# Patient Record
Sex: Male | Born: 1940
Health system: Southern US, Community
[De-identification: ages and names within clinical notes are randomized; demographics above are authoritative.]

## PROBLEM LIST (undated history)

## (undated) DIAGNOSIS — K222 Esophageal obstruction: Secondary | ICD-10-CM

## (undated) DIAGNOSIS — D649 Anemia, unspecified: Secondary | ICD-10-CM

## (undated) DIAGNOSIS — G8929 Other chronic pain: Secondary | ICD-10-CM

## (undated) DIAGNOSIS — M4800 Spinal stenosis, site unspecified: Secondary | ICD-10-CM

## (undated) DIAGNOSIS — M545 Low back pain, unspecified: Secondary | ICD-10-CM

## (undated) DIAGNOSIS — E782 Mixed hyperlipidemia: Secondary | ICD-10-CM

## (undated) DIAGNOSIS — E119 Type 2 diabetes mellitus without complications: Secondary | ICD-10-CM

## (undated) DIAGNOSIS — Z9989 Dependence on other enabling machines and devices: Secondary | ICD-10-CM

## (undated) DIAGNOSIS — I10 Essential (primary) hypertension: Secondary | ICD-10-CM

## (undated) DIAGNOSIS — M1990 Unspecified osteoarthritis, unspecified site: Secondary | ICD-10-CM

## (undated) DIAGNOSIS — K219 Gastro-esophageal reflux disease without esophagitis: Secondary | ICD-10-CM

## (undated) DIAGNOSIS — R590 Localized enlarged lymph nodes: Secondary | ICD-10-CM

## (undated) DIAGNOSIS — D61818 Other pancytopenia: Secondary | ICD-10-CM

## (undated) DIAGNOSIS — Z950 Presence of cardiac pacemaker: Secondary | ICD-10-CM

## (undated) DIAGNOSIS — I350 Nonrheumatic aortic (valve) stenosis: Secondary | ICD-10-CM

## (undated) DIAGNOSIS — Z8739 Personal history of other diseases of the musculoskeletal system and connective tissue: Secondary | ICD-10-CM

## (undated) DIAGNOSIS — K5790 Diverticulosis of intestine, part unspecified, without perforation or abscess without bleeding: Secondary | ICD-10-CM

## (undated) DIAGNOSIS — G4733 Obstructive sleep apnea (adult) (pediatric): Secondary | ICD-10-CM

## (undated) DIAGNOSIS — C8330 Diffuse large B-cell lymphoma, unspecified site: Secondary | ICD-10-CM

## (undated) DIAGNOSIS — I2510 Atherosclerotic heart disease of native coronary artery without angina pectoris: Secondary | ICD-10-CM

## (undated) DIAGNOSIS — R001 Bradycardia, unspecified: Secondary | ICD-10-CM

## (undated) DIAGNOSIS — Z972 Presence of dental prosthetic device (complete) (partial): Secondary | ICD-10-CM

## (undated) HISTORY — DX: Diverticulosis of intestine, part unspecified, without perforation or abscess without bleeding: K57.90

## (undated) HISTORY — DX: Spinal stenosis, site unspecified: M48.00

## (undated) HISTORY — PX: OTHER SURGICAL HISTORY: SHX169

## (undated) HISTORY — DX: Unspecified osteoarthritis, unspecified site: M19.90

## (undated) HISTORY — DX: Esophageal obstruction: K22.2

## (undated) HISTORY — DX: Essential (primary) hypertension: I10

## (undated) HISTORY — PX: PANENDOSCOPY: SHX2159

## (undated) HISTORY — DX: Anemia, unspecified: D64.9

## (undated) HISTORY — PX: ESOPHAGEAL DILATION: SHX303

## (undated) HISTORY — PX: FLEXIBLE SIGMOIDOSCOPY: SHX1649

## (undated) HISTORY — DX: Atherosclerotic heart disease of native coronary artery without angina pectoris: I25.10

## (undated) HISTORY — DX: Mixed hyperlipidemia: E78.2

## (undated) HISTORY — DX: Gastro-esophageal reflux disease without esophagitis: K21.9

## (undated) HISTORY — PX: VASECTOMY: SHX75

## (undated) HISTORY — PX: ESOPHAGOGASTRODUODENOSCOPY: SHX1529

## (undated) HISTORY — PX: APPENDECTOMY: SHX54

## (undated) HISTORY — PX: TONSILLECTOMY: SUR1361

## (undated) HISTORY — PX: BACK SURGERY: SHX140

## (undated) HISTORY — PX: SKIN CANCER EXCISION: SHX779

---

## 1991-07-02 HISTORY — PX: CORONARY ANGIOPLASTY: SHX604

## 1997-05-31 HISTORY — PX: CORONARY ANGIOPLASTY WITH STENT PLACEMENT: SHX49

## 1998-08-25 ENCOUNTER — Ambulatory Visit (HOSPITAL_COMMUNITY): Admission: RE | Admit: 1998-08-25 | Discharge: 1998-08-25 | Payer: Self-pay | Admitting: Gastroenterology

## 1998-08-25 ENCOUNTER — Encounter: Payer: Self-pay | Admitting: Gastroenterology

## 2000-03-01 HISTORY — PX: CORONARY ARTERY BYPASS GRAFT: SHX141

## 2000-03-20 ENCOUNTER — Encounter: Payer: Self-pay | Admitting: Emergency Medicine

## 2000-03-20 ENCOUNTER — Inpatient Hospital Stay (HOSPITAL_COMMUNITY): Admission: EM | Admit: 2000-03-20 | Discharge: 2000-03-30 | Payer: Self-pay | Admitting: Emergency Medicine

## 2000-03-26 ENCOUNTER — Encounter: Payer: Self-pay | Admitting: Surgery

## 2000-03-27 ENCOUNTER — Encounter: Payer: Self-pay | Admitting: Surgery

## 2000-03-28 ENCOUNTER — Encounter: Payer: Self-pay | Admitting: Thoracic Surgery (Cardiothoracic Vascular Surgery)

## 2000-03-28 ENCOUNTER — Encounter: Payer: Self-pay | Admitting: Surgery

## 2001-01-22 ENCOUNTER — Other Ambulatory Visit: Admission: RE | Admit: 2001-01-22 | Discharge: 2001-01-22 | Payer: Self-pay | Admitting: Gastroenterology

## 2001-01-22 ENCOUNTER — Encounter (INDEPENDENT_AMBULATORY_CARE_PROVIDER_SITE_OTHER): Payer: Self-pay | Admitting: Specialist

## 2002-07-30 ENCOUNTER — Encounter: Payer: Self-pay | Admitting: Internal Medicine

## 2002-07-30 ENCOUNTER — Encounter: Admission: RE | Admit: 2002-07-30 | Discharge: 2002-07-30 | Payer: Self-pay | Admitting: Internal Medicine

## 2003-10-31 ENCOUNTER — Ambulatory Visit (HOSPITAL_COMMUNITY): Admission: RE | Admit: 2003-10-31 | Discharge: 2003-10-31 | Payer: Self-pay | Admitting: Gastroenterology

## 2004-05-04 ENCOUNTER — Ambulatory Visit: Payer: Self-pay | Admitting: Internal Medicine

## 2004-08-01 ENCOUNTER — Ambulatory Visit: Payer: Self-pay | Admitting: Internal Medicine

## 2004-08-21 ENCOUNTER — Ambulatory Visit: Payer: Self-pay | Admitting: Family Medicine

## 2004-08-27 ENCOUNTER — Ambulatory Visit: Payer: Self-pay | Admitting: Family Medicine

## 2004-08-30 ENCOUNTER — Ambulatory Visit: Payer: Self-pay | Admitting: Cardiology

## 2004-09-17 ENCOUNTER — Ambulatory Visit: Payer: Self-pay | Admitting: Cardiology

## 2004-09-20 ENCOUNTER — Ambulatory Visit: Payer: Self-pay | Admitting: Cardiology

## 2004-11-19 ENCOUNTER — Ambulatory Visit: Payer: Self-pay | Admitting: Internal Medicine

## 2004-11-20 ENCOUNTER — Ambulatory Visit: Payer: Self-pay | Admitting: Internal Medicine

## 2004-12-03 ENCOUNTER — Ambulatory Visit: Payer: Self-pay | Admitting: Internal Medicine

## 2005-01-07 ENCOUNTER — Ambulatory Visit: Payer: Self-pay | Admitting: Internal Medicine

## 2005-01-08 ENCOUNTER — Ambulatory Visit: Payer: Self-pay | Admitting: Internal Medicine

## 2005-01-28 ENCOUNTER — Encounter: Admission: RE | Admit: 2005-01-28 | Discharge: 2005-04-28 | Payer: Self-pay | Admitting: Internal Medicine

## 2005-02-13 ENCOUNTER — Ambulatory Visit: Payer: Self-pay | Admitting: Cardiology

## 2005-02-27 ENCOUNTER — Ambulatory Visit: Payer: Self-pay | Admitting: Gastroenterology

## 2005-02-28 ENCOUNTER — Ambulatory Visit: Payer: Self-pay | Admitting: Cardiology

## 2005-03-12 ENCOUNTER — Ambulatory Visit: Payer: Self-pay | Admitting: Internal Medicine

## 2005-04-15 ENCOUNTER — Ambulatory Visit: Payer: Self-pay | Admitting: Internal Medicine

## 2005-06-21 ENCOUNTER — Ambulatory Visit: Payer: Self-pay | Admitting: Internal Medicine

## 2005-08-12 ENCOUNTER — Ambulatory Visit: Payer: Self-pay | Admitting: Internal Medicine

## 2005-08-22 ENCOUNTER — Ambulatory Visit: Payer: Self-pay | Admitting: Internal Medicine

## 2005-09-06 ENCOUNTER — Ambulatory Visit: Payer: Self-pay | Admitting: Internal Medicine

## 2005-10-07 ENCOUNTER — Ambulatory Visit: Payer: Self-pay | Admitting: Internal Medicine

## 2006-01-20 ENCOUNTER — Ambulatory Visit: Payer: Self-pay | Admitting: Internal Medicine

## 2006-01-21 ENCOUNTER — Ambulatory Visit: Payer: Self-pay | Admitting: Internal Medicine

## 2006-01-29 ENCOUNTER — Ambulatory Visit: Payer: Self-pay | Admitting: Cardiology

## 2006-02-05 ENCOUNTER — Ambulatory Visit: Payer: Self-pay

## 2006-05-26 ENCOUNTER — Ambulatory Visit: Payer: Self-pay | Admitting: Internal Medicine

## 2006-05-26 LAB — CONVERTED CEMR LAB: Creatinine,U: 113.5 mg/dL

## 2006-07-14 ENCOUNTER — Ambulatory Visit: Payer: Self-pay | Admitting: Internal Medicine

## 2006-07-31 ENCOUNTER — Ambulatory Visit: Payer: Self-pay | Admitting: Internal Medicine

## 2006-08-17 DIAGNOSIS — K229 Disease of esophagus, unspecified: Secondary | ICD-10-CM | POA: Insufficient documentation

## 2006-08-17 DIAGNOSIS — M109 Gout, unspecified: Secondary | ICD-10-CM | POA: Insufficient documentation

## 2006-08-26 ENCOUNTER — Ambulatory Visit: Payer: Self-pay | Admitting: Internal Medicine

## 2006-11-07 ENCOUNTER — Encounter: Payer: Self-pay | Admitting: Internal Medicine

## 2006-11-07 ENCOUNTER — Ambulatory Visit: Payer: Self-pay | Admitting: Internal Medicine

## 2006-11-07 LAB — CONVERTED CEMR LAB
ALT: 33 units/L (ref 0–40)
AST: 28 units/L (ref 0–37)
BUN: 12 mg/dL (ref 6–23)
Cholesterol: 145 mg/dL (ref 0–200)
Creatinine, Ser: 1 mg/dL (ref 0.4–1.5)
Direct LDL: 65.6 mg/dL
HDL: 27.1 mg/dL — ABNORMAL LOW (ref >39.0)
Potassium: 4.4 meq/L (ref 3.5–5.1)
Total CHOL/HDL Ratio: 5.4
Triglycerides: 463 mg/dL (ref 0–149)
Uric Acid, Serum: 5.9 mg/dL (ref 2.4–7.0)
VLDL: 93 mg/dL — ABNORMAL HIGH (ref 0–40)

## 2006-11-19 ENCOUNTER — Encounter: Payer: Self-pay | Admitting: Internal Medicine

## 2006-12-01 ENCOUNTER — Ambulatory Visit: Payer: Self-pay | Admitting: Gastroenterology

## 2006-12-16 ENCOUNTER — Ambulatory Visit: Payer: Self-pay | Admitting: Otolaryngology

## 2007-01-04 ENCOUNTER — Ambulatory Visit: Payer: Self-pay | Admitting: Otolaryngology

## 2007-01-16 ENCOUNTER — Ambulatory Visit: Payer: Self-pay | Admitting: Otolaryngology

## 2007-01-30 ENCOUNTER — Ambulatory Visit: Payer: Self-pay | Admitting: Cardiology

## 2007-02-02 ENCOUNTER — Ambulatory Visit: Payer: Self-pay | Admitting: Cardiology

## 2007-02-02 LAB — CONVERTED CEMR LAB
ALT: 29 units/L (ref 0–53)
AST: 24 units/L (ref 0–37)
Alkaline Phosphatase: 113 units/L (ref 39–117)
Bilirubin, Direct: 0.1 mg/dL (ref 0.0–0.3)
Cholesterol: 157 mg/dL (ref 0–200)
HDL: 33.5 mg/dL — ABNORMAL LOW (ref >39.0)
Total Bilirubin: 0.7 mg/dL (ref 0.3–1.2)
Total CHOL/HDL Ratio: 4.7
Total Protein: 7 g/dL (ref 6.0–8.3)
VLDL: 40 mg/dL (ref 0–40)

## 2007-03-11 ENCOUNTER — Ambulatory Visit: Payer: Self-pay | Admitting: Cardiology

## 2007-03-27 ENCOUNTER — Ambulatory Visit: Payer: Self-pay | Admitting: Cardiology

## 2007-05-27 ENCOUNTER — Ambulatory Visit: Payer: Self-pay | Admitting: Internal Medicine

## 2007-06-23 ENCOUNTER — Ambulatory Visit: Payer: Self-pay | Admitting: Cardiology

## 2007-06-23 LAB — CONVERTED CEMR LAB
BUN: 17 mg/dL (ref 6–23)
CO2: 28 meq/L (ref 19–32)
Creatinine, Ser: 0.9 mg/dL (ref 0.4–1.5)
Sodium: 140 meq/L (ref 135–145)

## 2007-07-24 ENCOUNTER — Ambulatory Visit: Payer: Self-pay | Admitting: Cardiology

## 2007-08-05 ENCOUNTER — Ambulatory Visit: Payer: Self-pay | Admitting: Cardiology

## 2007-08-05 LAB — CONVERTED CEMR LAB
GFR calc Af Amer: 95 mL/min
GFR calc non Af Amer: 79 mL/min

## 2007-09-11 ENCOUNTER — Telehealth (INDEPENDENT_AMBULATORY_CARE_PROVIDER_SITE_OTHER): Payer: Self-pay | Admitting: *Deleted

## 2007-09-11 ENCOUNTER — Emergency Department (HOSPITAL_COMMUNITY): Admission: EM | Admit: 2007-09-11 | Discharge: 2007-09-11 | Payer: Self-pay | Admitting: Emergency Medicine

## 2007-10-01 ENCOUNTER — Ambulatory Visit: Payer: Self-pay | Admitting: Internal Medicine

## 2007-10-20 ENCOUNTER — Emergency Department (HOSPITAL_COMMUNITY): Admission: EM | Admit: 2007-10-20 | Discharge: 2007-10-20 | Payer: Self-pay | Admitting: Emergency Medicine

## 2007-10-26 ENCOUNTER — Ambulatory Visit: Payer: Self-pay | Admitting: Cardiology

## 2007-12-29 ENCOUNTER — Ambulatory Visit: Payer: Self-pay | Admitting: Internal Medicine

## 2007-12-29 ENCOUNTER — Encounter (INDEPENDENT_AMBULATORY_CARE_PROVIDER_SITE_OTHER): Payer: Self-pay | Admitting: *Deleted

## 2007-12-29 LAB — CONVERTED CEMR LAB
OCCULT 1: NEGATIVE
OCCULT 2: NEGATIVE

## 2008-02-25 ENCOUNTER — Ambulatory Visit: Payer: Self-pay | Admitting: Cardiology

## 2008-04-11 ENCOUNTER — Ambulatory Visit: Payer: Self-pay

## 2008-04-11 ENCOUNTER — Encounter: Payer: Self-pay | Admitting: Internal Medicine

## 2008-05-12 ENCOUNTER — Telehealth (INDEPENDENT_AMBULATORY_CARE_PROVIDER_SITE_OTHER): Payer: Self-pay | Admitting: *Deleted

## 2008-05-23 ENCOUNTER — Ambulatory Visit: Payer: Self-pay | Admitting: Internal Medicine

## 2008-05-23 LAB — CONVERTED CEMR LAB
Alkaline Phosphatase: 120 units/L — ABNORMAL HIGH (ref 39–117)
Bilirubin, Direct: 0.1 mg/dL (ref 0.0–0.3)
CO2: 29 meq/L (ref 19–32)
GFR calc Af Amer: 95 mL/min
Glucose, Bld: 113 mg/dL — ABNORMAL HIGH (ref 70–99)
HDL: 39.3 mg/dL (ref >39.0)
Potassium: 4.2 meq/L (ref 3.5–5.1)
Sodium: 141 meq/L (ref 135–145)
Total Bilirubin: 0.8 mg/dL (ref 0.3–1.2)
Total Protein: 7 g/dL (ref 6.0–8.3)

## 2008-05-27 ENCOUNTER — Ambulatory Visit: Payer: Self-pay | Admitting: Internal Medicine

## 2008-06-08 ENCOUNTER — Telehealth (INDEPENDENT_AMBULATORY_CARE_PROVIDER_SITE_OTHER): Payer: Self-pay | Admitting: *Deleted

## 2008-06-09 ENCOUNTER — Telehealth (INDEPENDENT_AMBULATORY_CARE_PROVIDER_SITE_OTHER): Payer: Self-pay | Admitting: *Deleted

## 2008-06-20 ENCOUNTER — Ambulatory Visit: Payer: Self-pay | Admitting: Internal Medicine

## 2008-06-22 ENCOUNTER — Ambulatory Visit: Payer: Self-pay | Admitting: Internal Medicine

## 2008-07-08 ENCOUNTER — Ambulatory Visit: Payer: Self-pay | Admitting: Internal Medicine

## 2008-07-18 ENCOUNTER — Encounter (INDEPENDENT_AMBULATORY_CARE_PROVIDER_SITE_OTHER): Payer: Self-pay | Admitting: *Deleted

## 2008-07-18 ENCOUNTER — Ambulatory Visit: Payer: Self-pay | Admitting: Internal Medicine

## 2008-07-20 ENCOUNTER — Encounter (INDEPENDENT_AMBULATORY_CARE_PROVIDER_SITE_OTHER): Payer: Self-pay | Admitting: *Deleted

## 2008-08-15 ENCOUNTER — Ambulatory Visit: Payer: Self-pay | Admitting: Internal Medicine

## 2008-08-15 DIAGNOSIS — K219 Gastro-esophageal reflux disease without esophagitis: Secondary | ICD-10-CM | POA: Insufficient documentation

## 2008-08-17 ENCOUNTER — Ambulatory Visit: Payer: Self-pay | Admitting: Cardiology

## 2008-08-17 ENCOUNTER — Telehealth (INDEPENDENT_AMBULATORY_CARE_PROVIDER_SITE_OTHER): Payer: Self-pay | Admitting: *Deleted

## 2008-08-17 ENCOUNTER — Encounter (INDEPENDENT_AMBULATORY_CARE_PROVIDER_SITE_OTHER): Payer: Self-pay | Admitting: *Deleted

## 2008-08-17 ENCOUNTER — Encounter: Payer: Self-pay | Admitting: Internal Medicine

## 2008-08-17 LAB — CONVERTED CEMR LAB
Basophils Relative: 0.9 % (ref 0.0–3.0)
Eosinophils Absolute: 0.3 10*3/uL (ref 0.0–0.7)
Eosinophils Relative: 4.7 % (ref 0.0–5.0)
HCT: 45.4 % (ref 39.0–52.0)
Hemoglobin: 15.4 g/dL (ref 13.0–17.0)
MCV: 86.2 fL (ref 78.0–100.0)
Monocytes Absolute: 0.5 10*3/uL (ref 0.1–1.0)
Neutro Abs: 3.2 10*3/uL (ref 1.4–7.7)
Platelets: 255 10*3/uL (ref 150–400)
WBC: 6.1 10*3/uL (ref 4.5–10.5)

## 2008-08-19 ENCOUNTER — Encounter (INDEPENDENT_AMBULATORY_CARE_PROVIDER_SITE_OTHER): Payer: Self-pay | Admitting: *Deleted

## 2008-09-05 ENCOUNTER — Encounter: Payer: Self-pay | Admitting: Internal Medicine

## 2008-09-19 ENCOUNTER — Ambulatory Visit (HOSPITAL_COMMUNITY): Admission: RE | Admit: 2008-09-19 | Discharge: 2008-09-20 | Payer: Self-pay | Admitting: Otolaryngology

## 2008-10-04 ENCOUNTER — Encounter: Payer: Self-pay | Admitting: Internal Medicine

## 2008-10-10 ENCOUNTER — Ambulatory Visit: Payer: Self-pay | Admitting: Family Medicine

## 2008-10-10 DIAGNOSIS — J309 Allergic rhinitis, unspecified: Secondary | ICD-10-CM | POA: Insufficient documentation

## 2008-11-26 DIAGNOSIS — E785 Hyperlipidemia, unspecified: Secondary | ICD-10-CM | POA: Insufficient documentation

## 2008-12-12 ENCOUNTER — Ambulatory Visit: Payer: Self-pay | Admitting: Internal Medicine

## 2008-12-12 DIAGNOSIS — R351 Nocturia: Secondary | ICD-10-CM | POA: Insufficient documentation

## 2008-12-12 DIAGNOSIS — N401 Enlarged prostate with lower urinary tract symptoms: Secondary | ICD-10-CM

## 2008-12-12 DIAGNOSIS — N138 Other obstructive and reflux uropathy: Secondary | ICD-10-CM

## 2008-12-13 ENCOUNTER — Telehealth (INDEPENDENT_AMBULATORY_CARE_PROVIDER_SITE_OTHER): Payer: Self-pay | Admitting: *Deleted

## 2008-12-15 ENCOUNTER — Ambulatory Visit: Payer: Self-pay | Admitting: Internal Medicine

## 2008-12-18 LAB — CONVERTED CEMR LAB
Alkaline Phosphatase: 119 units/L — ABNORMAL HIGH (ref 39–117)
BUN: 22 mg/dL (ref 6–23)
Bilirubin, Direct: 0.1 mg/dL (ref 0.0–0.3)
Creatinine, Ser: 1 mg/dL (ref 0.4–1.5)
HDL: 31.1 mg/dL — ABNORMAL LOW (ref >39.00)
PSA: 0.57 ng/mL (ref 0.10–4.00)
Potassium: 4.5 meq/L (ref 3.5–5.1)
TSH: 2.9 microintl units/mL (ref 0.35–5.50)
Total Bilirubin: 0.7 mg/dL (ref 0.3–1.2)
Triglycerides: 449 mg/dL — ABNORMAL HIGH (ref 0.0–149.0)
VLDL: 89.8 mg/dL — ABNORMAL HIGH (ref 0.0–40.0)

## 2008-12-19 ENCOUNTER — Encounter (INDEPENDENT_AMBULATORY_CARE_PROVIDER_SITE_OTHER): Payer: Self-pay | Admitting: *Deleted

## 2009-01-04 ENCOUNTER — Encounter: Payer: Self-pay | Admitting: Internal Medicine

## 2009-01-18 ENCOUNTER — Telehealth (INDEPENDENT_AMBULATORY_CARE_PROVIDER_SITE_OTHER): Payer: Self-pay | Admitting: *Deleted

## 2009-01-19 ENCOUNTER — Encounter (INDEPENDENT_AMBULATORY_CARE_PROVIDER_SITE_OTHER): Payer: Self-pay | Admitting: *Deleted

## 2009-02-13 ENCOUNTER — Encounter: Payer: Self-pay | Admitting: Internal Medicine

## 2009-02-21 ENCOUNTER — Ambulatory Visit: Payer: Self-pay | Admitting: Cardiology

## 2009-02-21 DIAGNOSIS — R600 Localized edema: Secondary | ICD-10-CM

## 2009-03-02 ENCOUNTER — Ambulatory Visit: Payer: Self-pay | Admitting: Cardiology

## 2009-03-03 LAB — CONVERTED CEMR LAB
BUN: 13 mg/dL (ref 6–23)
Chloride: 106 meq/L (ref 96–112)
GFR calc non Af Amer: 88.51 mL/min (ref >60)
Glucose, Bld: 123 mg/dL — ABNORMAL HIGH (ref 70–99)
Potassium: 4.4 meq/L (ref 3.5–5.1)
Sodium: 141 meq/L (ref 135–145)

## 2009-03-09 ENCOUNTER — Ambulatory Visit: Payer: Self-pay | Admitting: Internal Medicine

## 2009-03-16 ENCOUNTER — Telehealth (INDEPENDENT_AMBULATORY_CARE_PROVIDER_SITE_OTHER): Payer: Self-pay | Admitting: *Deleted

## 2009-03-22 DIAGNOSIS — C4492 Squamous cell carcinoma of skin, unspecified: Secondary | ICD-10-CM | POA: Insufficient documentation

## 2009-03-22 HISTORY — DX: Squamous cell carcinoma of skin, unspecified: C44.92

## 2009-03-24 ENCOUNTER — Telehealth: Payer: Self-pay | Admitting: Cardiology

## 2009-03-29 ENCOUNTER — Telehealth: Payer: Self-pay | Admitting: Cardiology

## 2009-03-29 ENCOUNTER — Ambulatory Visit: Payer: Self-pay | Admitting: Cardiology

## 2009-03-29 DIAGNOSIS — I10 Essential (primary) hypertension: Secondary | ICD-10-CM

## 2009-03-30 LAB — CONVERTED CEMR LAB
CO2: 29 meq/L (ref 19–32)
Calcium: 9.8 mg/dL (ref 8.4–10.5)
Chloride: 100 meq/L (ref 96–112)
Creatinine, Ser: 1 mg/dL (ref 0.4–1.5)
Glucose, Bld: 82 mg/dL (ref 70–99)
Sodium: 139 meq/L (ref 135–145)

## 2009-04-11 ENCOUNTER — Ambulatory Visit: Payer: Self-pay | Admitting: Family Medicine

## 2009-05-03 ENCOUNTER — Telehealth (INDEPENDENT_AMBULATORY_CARE_PROVIDER_SITE_OTHER): Payer: Self-pay | Admitting: *Deleted

## 2009-05-04 ENCOUNTER — Ambulatory Visit: Payer: Self-pay | Admitting: Internal Medicine

## 2009-05-05 ENCOUNTER — Telehealth (INDEPENDENT_AMBULATORY_CARE_PROVIDER_SITE_OTHER): Payer: Self-pay | Admitting: *Deleted

## 2009-05-08 ENCOUNTER — Encounter (INDEPENDENT_AMBULATORY_CARE_PROVIDER_SITE_OTHER): Payer: Self-pay | Admitting: *Deleted

## 2009-05-08 LAB — CONVERTED CEMR LAB
Direct LDL: 48.7 mg/dL
HDL: 33.1 mg/dL — ABNORMAL LOW (ref >39.00)
Hgb A1c MFr Bld: 6.7 % — ABNORMAL HIGH (ref 4.6–6.5)
Total CHOL/HDL Ratio: 5
Triglycerides: 611 mg/dL — ABNORMAL HIGH (ref 0.0–149.0)

## 2009-05-24 ENCOUNTER — Encounter (INDEPENDENT_AMBULATORY_CARE_PROVIDER_SITE_OTHER): Payer: Self-pay | Admitting: *Deleted

## 2009-05-24 ENCOUNTER — Ambulatory Visit: Payer: Self-pay | Admitting: Cardiology

## 2009-05-31 ENCOUNTER — Telehealth (INDEPENDENT_AMBULATORY_CARE_PROVIDER_SITE_OTHER): Payer: Self-pay | Admitting: *Deleted

## 2009-06-05 ENCOUNTER — Telehealth (INDEPENDENT_AMBULATORY_CARE_PROVIDER_SITE_OTHER): Payer: Self-pay | Admitting: *Deleted

## 2009-06-06 ENCOUNTER — Observation Stay (HOSPITAL_COMMUNITY): Admission: RE | Admit: 2009-06-06 | Discharge: 2009-06-07 | Payer: Self-pay | Admitting: Orthopedic Surgery

## 2009-06-21 ENCOUNTER — Ambulatory Visit: Payer: Self-pay | Admitting: Internal Medicine

## 2009-06-29 ENCOUNTER — Telehealth: Payer: Self-pay | Admitting: Cardiology

## 2009-07-01 HISTORY — PX: KNEE ARTHROSCOPY: SHX127

## 2009-07-04 ENCOUNTER — Ambulatory Visit: Payer: Self-pay | Admitting: Internal Medicine

## 2009-07-06 ENCOUNTER — Telehealth: Payer: Self-pay | Admitting: Cardiology

## 2009-07-25 ENCOUNTER — Ambulatory Visit: Payer: Self-pay | Admitting: Internal Medicine

## 2009-07-26 ENCOUNTER — Encounter (INDEPENDENT_AMBULATORY_CARE_PROVIDER_SITE_OTHER): Payer: Self-pay | Admitting: *Deleted

## 2009-08-15 ENCOUNTER — Ambulatory Visit: Payer: Self-pay | Admitting: Internal Medicine

## 2009-08-15 DIAGNOSIS — E119 Type 2 diabetes mellitus without complications: Secondary | ICD-10-CM

## 2009-08-15 DIAGNOSIS — J449 Chronic obstructive pulmonary disease, unspecified: Secondary | ICD-10-CM | POA: Insufficient documentation

## 2009-08-16 ENCOUNTER — Ambulatory Visit: Payer: Self-pay | Admitting: Internal Medicine

## 2009-08-18 LAB — CONVERTED CEMR LAB
BUN: 14 mg/dL (ref 6–23)
Basophils Absolute: 0.1 10*3/uL (ref 0.0–0.1)
Basophils Relative: 1.2 % (ref 0.0–3.0)
Creatinine, Ser: 1 mg/dL (ref 0.4–1.5)
Creatinine,U: 128.1 mg/dL
Eosinophils Absolute: 0.2 10*3/uL (ref 0.0–0.7)
Eosinophils Relative: 4.1 % (ref 0.0–5.0)
HCT: 40.6 % (ref 39.0–52.0)
Hemoglobin: 13.4 g/dL (ref 13.0–17.0)
Hgb A1c MFr Bld: 6.9 % — ABNORMAL HIGH (ref 4.6–6.5)
Lymphocytes Relative: 38.4 % (ref 12.0–46.0)
Lymphs Abs: 2 10*3/uL (ref 0.7–4.0)
MCHC: 32.9 g/dL (ref 30.0–36.0)
MCV: 78.9 fL (ref 78.0–100.0)
Microalb Creat Ratio: 3.1 mg/g (ref 0.0–30.0)
Microalb, Ur: 0.4 mg/dL (ref 0.0–1.9)
Monocytes Absolute: 1.2 10*3/uL — ABNORMAL HIGH (ref 0.1–1.0)
Monocytes Relative: 24 % — ABNORMAL HIGH (ref 3.0–12.0)
Neutro Abs: 1.7 10*3/uL (ref 1.4–7.7)
Neutrophils Relative %: 32.3 % — ABNORMAL LOW (ref 43.0–77.0)
Platelets: 303 10*3/uL (ref 150.0–400.0)
Potassium: 4.2 meq/L (ref 3.5–5.1)
RBC: 5.15 M/uL (ref 4.22–5.81)
RDW: 16.6 % — ABNORMAL HIGH (ref 11.5–14.6)
WBC: 5.2 10*3/uL (ref 4.5–10.5)

## 2009-08-22 ENCOUNTER — Encounter: Payer: Self-pay | Admitting: Internal Medicine

## 2009-08-22 ENCOUNTER — Ambulatory Visit: Payer: Self-pay | Admitting: Cardiology

## 2009-08-22 DIAGNOSIS — I25810 Atherosclerosis of coronary artery bypass graft(s) without angina pectoris: Secondary | ICD-10-CM

## 2009-08-25 ENCOUNTER — Encounter: Payer: Self-pay | Admitting: Internal Medicine

## 2009-08-25 ENCOUNTER — Telehealth: Payer: Self-pay | Admitting: Internal Medicine

## 2009-08-31 ENCOUNTER — Encounter: Payer: Self-pay | Admitting: Internal Medicine

## 2009-09-14 ENCOUNTER — Telehealth (INDEPENDENT_AMBULATORY_CARE_PROVIDER_SITE_OTHER): Payer: Self-pay | Admitting: *Deleted

## 2009-09-27 ENCOUNTER — Encounter: Payer: Self-pay | Admitting: Internal Medicine

## 2009-10-19 ENCOUNTER — Encounter: Payer: Self-pay | Admitting: Internal Medicine

## 2009-11-02 ENCOUNTER — Ambulatory Visit: Payer: Self-pay | Admitting: Internal Medicine

## 2009-11-02 DIAGNOSIS — R209 Unspecified disturbances of skin sensation: Secondary | ICD-10-CM | POA: Insufficient documentation

## 2010-01-30 ENCOUNTER — Ambulatory Visit: Payer: Self-pay | Admitting: Cardiology

## 2010-03-20 ENCOUNTER — Ambulatory Visit: Payer: Self-pay | Admitting: Internal Medicine

## 2010-03-20 DIAGNOSIS — M1710 Unilateral primary osteoarthritis, unspecified knee: Secondary | ICD-10-CM

## 2010-03-20 DIAGNOSIS — IMO0002 Reserved for concepts with insufficient information to code with codable children: Secondary | ICD-10-CM | POA: Insufficient documentation

## 2010-03-20 DIAGNOSIS — M1990 Unspecified osteoarthritis, unspecified site: Secondary | ICD-10-CM | POA: Insufficient documentation

## 2010-03-21 ENCOUNTER — Ambulatory Visit: Payer: Self-pay | Admitting: Internal Medicine

## 2010-03-23 LAB — CONVERTED CEMR LAB
ALT: 24 units/L (ref 0–53)
Albumin: 4.1 g/dL (ref 3.5–5.2)
BUN: 18 mg/dL (ref 6–23)
Basophils Absolute: 0 10*3/uL (ref 0.0–0.1)
Bilirubin, Direct: 0.1 mg/dL (ref 0.0–0.3)
CO2: 27 meq/L (ref 19–32)
Calcium: 9.3 mg/dL (ref 8.4–10.5)
Chloride: 104 meq/L (ref 96–112)
Creatinine, Ser: 1 mg/dL (ref 0.4–1.5)
Eosinophils Relative: 3 % (ref 0.0–5.0)
Glucose, Bld: 86 mg/dL (ref 70–99)
HCT: 39.3 % (ref 39.0–52.0)
HDL: 30.8 mg/dL — ABNORMAL LOW (ref >39.00)
Hemoglobin: 13.1 g/dL (ref 13.0–17.0)
Lymphs Abs: 1.5 10*3/uL (ref 0.7–4.0)
MCV: 81.7 fL (ref 78.0–100.0)
Monocytes Absolute: 0.6 10*3/uL (ref 0.1–1.0)
Monocytes Relative: 8.8 % (ref 3.0–12.0)
Neutro Abs: 4.5 10*3/uL (ref 1.4–7.7)
RDW: 16.6 % — ABNORMAL HIGH (ref 11.5–14.6)
TSH: 1.76 microintl units/mL (ref 0.35–5.50)
Total Protein: 6.8 g/dL (ref 6.0–8.3)
Triglycerides: 329 mg/dL — ABNORMAL HIGH (ref 0.0–149.0)

## 2010-04-09 ENCOUNTER — Telehealth: Payer: Self-pay | Admitting: Internal Medicine

## 2010-05-01 ENCOUNTER — Ambulatory Visit: Payer: Self-pay | Admitting: Internal Medicine

## 2010-05-04 ENCOUNTER — Ambulatory Visit: Payer: Self-pay | Admitting: Cardiology

## 2010-05-07 ENCOUNTER — Ambulatory Visit: Payer: Self-pay | Admitting: Internal Medicine

## 2010-05-08 LAB — CONVERTED CEMR LAB: Uric Acid, Serum: 10.2 mg/dL — ABNORMAL HIGH (ref 4.0–7.8)

## 2010-05-16 ENCOUNTER — Encounter: Payer: Self-pay | Admitting: Family Medicine

## 2010-05-17 ENCOUNTER — Ambulatory Visit: Payer: Self-pay | Admitting: Internal Medicine

## 2010-05-21 ENCOUNTER — Telehealth: Payer: Self-pay | Admitting: Internal Medicine

## 2010-05-28 ENCOUNTER — Telehealth: Payer: Self-pay | Admitting: Internal Medicine

## 2010-05-29 ENCOUNTER — Ambulatory Visit: Payer: Self-pay | Admitting: Family Medicine

## 2010-05-29 DIAGNOSIS — A088 Other specified intestinal infections: Secondary | ICD-10-CM

## 2010-05-29 LAB — CONVERTED CEMR LAB
Bilirubin Urine: NEGATIVE
Ketones, urine, test strip: NEGATIVE
Nitrite: NEGATIVE
Protein, U semiquant: NEGATIVE
Urobilinogen, UA: NEGATIVE

## 2010-05-30 LAB — CONVERTED CEMR LAB
AST: 22 units/L (ref 0–37)
Albumin: 3.9 g/dL (ref 3.5–5.2)
BUN: 23 mg/dL (ref 6–23)
Basophils Absolute: 0 10*3/uL (ref 0.0–0.1)
CO2: 26 meq/L (ref 19–32)
Eosinophils Absolute: 0 10*3/uL (ref 0.0–0.7)
Glucose, Bld: 93 mg/dL (ref 70–99)
HCT: 39.2 % (ref 39.0–52.0)
Hemoglobin: 13.3 g/dL (ref 13.0–17.0)
Lymphs Abs: 0.7 10*3/uL (ref 0.7–4.0)
MCHC: 34 g/dL (ref 30.0–36.0)
MCV: 79.2 fL (ref 78.0–100.0)
Monocytes Absolute: 0.3 10*3/uL (ref 0.1–1.0)
Monocytes Relative: 6.3 % (ref 3.0–12.0)
Neutro Abs: 4.4 10*3/uL (ref 1.4–7.7)
Platelets: 299 10*3/uL (ref 150.0–400.0)
Potassium: 4.3 meq/L (ref 3.5–5.1)
RDW: 16.7 % — ABNORMAL HIGH (ref 11.5–14.6)
Sodium: 138 meq/L (ref 135–145)

## 2010-06-01 ENCOUNTER — Telehealth: Payer: Self-pay | Admitting: Cardiology

## 2010-07-03 ENCOUNTER — Ambulatory Visit
Admission: RE | Admit: 2010-07-03 | Discharge: 2010-07-03 | Payer: Self-pay | Source: Home / Self Care | Attending: Internal Medicine | Admitting: Internal Medicine

## 2010-07-03 LAB — CONVERTED CEMR LAB
Hgb A1c MFr Bld: 6.5 % (ref 4.6–6.5)
Uric Acid, Serum: 10.1 mg/dL — ABNORMAL HIGH (ref 4.0–7.8)

## 2010-07-06 ENCOUNTER — Ambulatory Visit
Admission: RE | Admit: 2010-07-06 | Discharge: 2010-07-06 | Payer: Self-pay | Source: Home / Self Care | Attending: Internal Medicine | Admitting: Internal Medicine

## 2010-07-06 DIAGNOSIS — G4733 Obstructive sleep apnea (adult) (pediatric): Secondary | ICD-10-CM | POA: Insufficient documentation

## 2010-07-19 ENCOUNTER — Telehealth: Payer: Self-pay | Admitting: Internal Medicine

## 2010-07-19 ENCOUNTER — Encounter: Payer: Self-pay | Admitting: Internal Medicine

## 2010-07-20 ENCOUNTER — Encounter: Payer: Self-pay | Admitting: Internal Medicine

## 2010-07-25 ENCOUNTER — Ambulatory Visit
Admission: RE | Admit: 2010-07-25 | Discharge: 2010-07-25 | Payer: Self-pay | Source: Home / Self Care | Attending: Internal Medicine | Admitting: Internal Medicine

## 2010-08-02 NOTE — Assessment & Plan Note (Signed)
Summary: MED REFILL/CBS   Vital Signs:  Patient profile:   70 year old male Height:      73.25 inches Weight:      267.8 pounds Temp:     98.0 degrees F oral Pulse rate:   60 / minute Resp:     14 per minute BP sitting:   122 / 70  (left arm) Cuff size:   large  Vitals Entered By: Georgette Dover CMA (March 20, 2010 2:28 PM) CC: Yearly follow-up on meds, heart followed by cardiologist, Type 2 diabetes mellitus follow-up   Primary Care Provider:  Unice Cobble MD  CC:  Yearly follow-up on meds, heart followed by cardiologist, and Type 2 diabetes mellitus follow-up.  History of Present Illness: Here for Medicare AWV: 1Risk factors based on Past M, S, F history:Dyslipidemia; Gout; ERD; HTN; DM( chart updated) 2.Physical Activities: plays golf once daily  3.Depression/mood: no issues  4.Hearing: whisper heard @ 6 ft 5.ADL's: no limitations 6.Fall Risk: Pain in L knee has altered gait especially with stairs 7.Home Safety: no issues  8.Height, weight, &visual acuity:wall chart read @ 6 ft with lenses 9.Counseling: none requested; Living Will & POA in place 10.Labs ordered based on risk factors: see Orders 11. Referral Coordination: referral to [place]. Care Plan: see Instructions 13. Cognitive Assessment:Oriented X 3; memory & recall  intact   ; "WORLD" spelled backwards; mood & affect normal Type 2 Diabetes Mellitus Follow-Up      This is a 70 year old man who presents for Type 2 diabetes mellitus follow-up.  The patient reports polyuria and self managed hypoglycemia, but denies polydipsia, blurred vision, weight loss, weight gain, and numbness of extremities.  He does question "Restless Leg Syndrome " @ night.Other symptoms include orthostatic symptoms if arising quickly.  The patient denies the following symptoms: neuropathic pain, chest pain, vomiting, poor wound healing, intermittent claudication, vision loss, and foot ulcer.  Since the last visit the patient reports fair   dietary compliance.  The patient has been measuring capillary blood glucose before breakfast , 90-118 , usually averages 100.  Since the last visit, the patient reports having had no eye care ( last exam in 2009) and no foot care.He was previously treated for Plantar Fasciitis.    Preventive Screening-Counseling & Management  Alcohol-Tobacco     Alcohol drinks/day: 0     Smoking Status: never  Caffeine-Diet-Exercise     Caffeine use/day: none     Diet Comments: no specific diet  Hep-HIV-STD-Contraception     Dental Visit-last 6 months yes     Sun Exposure-Excessive: no  Safety-Violence-Falls     Seat Belt Use: yes     Firearms in the Home: no firearms in the home     Smoke Detectors: yes      Sexual History:  Single.        Blood Transfusions:  no.        Travel History:  no foreign travel in 5 yrs.    Current Medications (verified): 1)  Toprol Xl 100 Mg Tb24 (Metoprolol Succinate) .[name].. 1 1/2 By Mouth Once Daily 2)  Lipitor 40 Mg Tabs (Atorvastatin Calcium) .... Take 1 Tablet By Mouth Once A Day 3)  Aspirin Ec 81 Mg Tbec (Aspirin) .... Take 1 Tablet By Mouth Every Morning 4)  Zetia 10 Mg Tabs (Ezetimibe) .... Take 1 Tablet By Mouth Once A Day 5)  Allopurinol 300 Mg Tabs (Allopurinol) .... Take 1 Tablet By Mouth Once A Day  6)  Metformin Hcl 1000 Mg Tabs (Metformin Hcl) .[name].. 1 By Mouth Two Times A Day **appointment Due** 7)  Freestyle Lancets  Misc (Lancets) .... Check Blood Sugar Daily 8)  Freestyle Lite Test  Strp (Glucose Blood) .... Test Once Daily 9)  Amlodipine Besylate 5 Mg Tabs (Amlodipine Besylate) .[name].. 1 By Mouth Daily 10)  Furosemide 40 Mg  Tabs (Furosemide) .... 2 Tab Qam...1 Tab Qpm 11)  Klor-Con M20 20 Meq  Tbcr (Potassium Chloride Crys Cr) .... 2 Tabs Qam..1 Tab Qpm 12)  Vitamin C 500 Mg Tabs (Ascorbic Acid) .[name].. 1 By Mouth Once Daily 13)  Citracal Petites/vitamin D 200-250 Mg-Unit Tabs (Calcium Citrate-Vitamin D) .[name].. 1 By Mouth Two Times A Day 14)  Glimepiride  2 Mg Tabs (Glimepiride) .[name].. 1 Q Am **appointment Due** 15)  Zegerid 40-1100 Mg Caps (Omeprazole-Sodium Bicarbonate) .[name].. 1 Each Am 16)  Nitroglycerin 0.4 Mg Subl (Nitroglycerin) .... One Tablet Under Tongue Every 5 Minutes As Needed For Chest Pain---May Repeat Times Three 17)  Fluticasone Propionate 50 Mcg/act Susp (Fluticasone Propionate) .[name].. 1 Spray Two Times A Day 18)  Align  Caps (Probiotic Product) .[name].. 1 Cap Once Daily 19)  Proventil Hfa 108 (90 Base) Mcg/act Aers (Albuterol Sulfate) .[name].. 1-2 Puffs Every 4 Hrs As Needed For Cough, Sob 20)  Cpap and 2liters O2 At Night .... At Bedtime 21)  Nebulizer Compressor  Misc (Nebulizers) .... Use With Proventil For Nebulizer Treatment Once Daily 22)  Gabapentin 100 Mg Caps (Gabapentin) .[name].. 1 At Bedtime  Allergies: 1)  ! * Benazepril 2)  ! * Hctz  Past History:  Past Medical History: Gout Esophageal stricture, PMH of 3-4X Coronary artery disease Mixed hyperlipidemia  Past Surgical History: Angioplasty 1993 Cath and stent placed 05/1997 Vastectomy 5 bypass surgery  03/2000 EGD with esophageal dilation , Dr Lyla Son Tonsillectomy Appendectomy  Family History: Father: CVA, ? HTN Mother: Pancreatic cancer Siblings:bro: health complications of service in [place]  Social History: Tobacco Use - No.  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         No dysphagia. Derm treats solar dermatologic lesions; no Vladimir malignancy to  date. Pain meds for L knee declined.  Physical Exam  General:  well-nourished; alert,appropriate and cooperative throughout examination Head:  Normocephalic and atraumatic without obvious abnormalities. No apparent alopecia or balding. Ears:  External ear exam shows no significant lesions or deformities.  Otoscopic examination reveals clear canals, tympanic membranes are intact bilaterally without bulging, retraction, inflammation or discharge. Hearing is grossly normal bilaterally. Nose:  External nasal examination shows no deformity or inflammation. Nasal mucosa are pink and moist without lesions or exudates. Mouth:  Oral mucosa and oropharynx without lesions or exudates.  Upper vpartial Neck:  No deformities, masses, or tenderness noted.. Multiple skin tags Lungs:  Normal respiratory effort, chest expands symmetrically. Lungs are clear to auscultation, no crackles or wheezes. Heart:  normal rate, regular rhythm, no gallop, no rub, no JVD, no HJR, and grade 1 /6 systolic murmur.  S4 Abdomen:  Bowel sounds positive,abdomen soft and non-tender without masses,  organomegaly or hernias noted. Rectal:  No external abnormalities noted. Normal sphincter tone. No rectal masses or tenderness. Genitalia:  Testes bilaterally descended without nodularity, tenderness or masses. No scrotal masses or lesions. No penis lesions or urethral discharge. vasectomy scar tissue.L varicocele.   Prostate:  no nodules, no asymmetry,  but  2+ enlarged.   Msk:  No deformity or scoliosis noted of thoracic or lumbar spine.   Pulses:  R and L carotid,radial,dorsalis pedis and posterior tibial pulses are full and equal bilaterally Extremities:  1/2+ left  and 1/2 + right pedal edema.   Neurologic:  alert & oriented X3 and DTRs symmetrical and normal.   Skin:  Solar skin changes Cervical Nodes:  No lymphadenopathy noted Axillary Nodes:  No palpable lymphadenopathy Psych:  memory intact for recent and remote, normally  interactive, and good eye contact.     Impression & Recommendations:  Problem # 1:  PREVENTIVE HEALTH CARE (ICD-V70.0)  Orders: [facility] -Subsequent Annual Wellness Visit [phone])  Problem # 2:  DIABETES MELLITUS, TYPE II (ICD-250.00)  His updated medication list for this problem includes:    Aspirin Ec 81 Mg Tbec (Aspirin) .[name]... Take 1 tablet by mouth every morning    Metformin Hcl 1000 Mg Tabs (Metformin hcl) .[name]... 1 by mouth two times a day    Glimepiride 2 Mg Tabs (Glimepiride) .[name]... 1 q am  Problem # 3:  HYPERPLASIA PROSTATE UNS W/UR OBST & OTH LUTS (ICD-600.91)  Problem # 4:  HYPERLIPIDEMIA, MIXED (ICD-272.2)  His updated medication list for this problem includes:    Lipitor 40 Mg Tabs (Atorvastatin calcium) .[name]... Take 1 tablet by mouth once a day    Zetia 10 Mg Tabs (Ezetimibe) .[name]... Take 1 tablet by mouth once a day  Problem # 5:  CAD, ARTERY BYPASS GRAFT (ICD-414.04) as per Dr Verl Blalock His updated medication list for this problem includes:    Toprol Xl 100 Mg Tb24 (Metoprolol succinate) .[name]... 1 1/2 by mouth once daily    Aspirin Ec 81 Mg Tbec (Aspirin) .[name]... Take 1 tablet by mouth every morning    Amlodipine Besylate 5 Mg Tabs (Amlodipine besylate) .[name]... 1 by mouth daily    Furosemide 40 Mg Tabs (Furosemide) .[name]... 2 tab qam...1 tab qpm    Nitroglycerin 0.4 Mg Subl (Nitroglycerin) ..... One tablet under tongue every 5 minutes as needed for chest pain---may repeat times three  Problem # 6:  HYPERTENSION, BENIGN (ICD-401.1) controlled His updated medication list for this problem includes:    Toprol Xl 100 Mg Tb24 (Metoprolol succinate) .[name]... 1 1/2 by mouth once daily    Amlodipine Besylate 5 Mg Tabs (Amlodipine besylate) .[name]... 1 by mouth daily    Furosemide 40 Mg Tabs (Furosemide) .[name]... 2 tab qam...1 tab qpm  Problem # 7:  DEGENERATIVE JOINT DISEASE, LEFT KNEE (ICD-715.96)  His updated medication list for this problem includes:    Aspirin Ec 81 Mg Tbec (Aspirin) .[name]... Take 1  tablet by mouth every morning  Orders: Orthopedic Referral (Ortho)  Problem # 8:  GOUT (ICD-274.9) PMH of His updated medication list for this problem includes:    Allopurinol 300 Mg Tabs (Allopurinol) .[name]... Take 1 tablet by mouth once a day  Complete Medication List: 1)  Toprol Xl 100 Mg Tb24 (Metoprolol succinate) .[name].. 1 1/2 by mouth once daily 2)  Lipitor 40 Mg Tabs (Atorvastatin calcium) .... Take 1 tablet by mouth once a day 3)  Aspirin Ec 81 Mg Tbec (Aspirin) .... Take 1 tablet  by mouth every morning 4)  Zetia 10 Mg Tabs (Ezetimibe) .... Take 1 tablet by mouth once a day 5)  Allopurinol 300 Mg Tabs (Allopurinol) .... Take 1 tablet by mouth once a day 6)  Metformin Hcl 1000 Mg Tabs (Metformin hcl) .[name].. 1 by mouth two times a day 7)  Freestyle Lancets Misc (Lancets) .... Check blood sugar daily 8)  Freestyle Lite Test Strp (Glucose blood) .... Test once daily 9)  Amlodipine Besylate 5 Mg Tabs (Amlodipine besylate) .[name].. 1 by mouth daily 10)  Furosemide 40 Mg Tabs (Furosemide) .... 2 tab qam...1 tab qpm 11)  Klor-con M20 20 Meq Tbcr (Potassium chloride crys cr) .... 2 tabs qam..1 tab qpm 12)  Vitamin C 500 Mg Tabs (Ascorbic acid) .[name].. 1 by mouth once daily 13)  Citracal Petites/vitamin D 200-250 Mg-unit Tabs (Calcium citrate-vitamin d) .[name].. 1 by mouth two times a day 14)  Glimepiride 2 Mg Tabs (Glimepiride) .[name].. 1 q am 15)  Zegerid 40-1100 Mg Caps (Omeprazole-sodium bicarbonate) .[name].. 1 each am 16)  Nitroglycerin 0.4 Mg Subl (Nitroglycerin) .... One tablet under tongue every 5 minutes as needed for chest pain---may repeat times three 17)  Fluticasone Propionate 50 Mcg/act Susp (Fluticasone propionate) .[name].. 1 spray two times a day 18)  Align Caps (Probiotic product) .[name].. 1 cap once daily 19)  Proventil Hfa 108 (90 Base) Mcg/act Aers (Albuterol sulfate) .[name].. 1-2 puffs every 4 hrs as needed for cough, sob 20)  Cpap and 2liters O2 At Night  .... At bedtime 21)  Nebulizer Compressor Misc  (Nebulizers) .... Use with proventil for nebulizer treatment once daily 22)  Gabapentin 100 Mg Caps (Gabapentin) .[name].. 1 at bedtime  Other Orders: Tdap => 54yrs IM [id number]) Admin 1st Vaccine [phone])  Patient Instructions: 1)  Please  call if you  change your mind about pain meds for your knee. Please schedule fasting labs; see Diagnoses for Codes:uric acid; 2)  BMP ; 3)  Hepatic Panel; 4)  Lipid Panel; 5)  TSH ; 6)  CBC w/ Diff ; 7)  PSA . Prescriptions: GLIMEPIRIDE 2 MG TABS (GLIMEPIRIDE) 1 q am  #90 x 1   Entered and Authorized by:   Unice Cobble MD   Signed by:   Unice Cobble MD on 03/20/2010   Method used:   Print then Give to Patient   RxID:   [id number] METFORMIN HCL 1000 MG TABS (METFORMIN HCL) 1 by mouth two times a day  #180 x 1   Entered and Authorized by:   Unice Cobble MD   Signed by:   Unice Cobble MD on 03/20/2010   Method used:   Print then Give to Patient   RxID:   [id number]    Immunizations Administered:  Tetanus Vaccine:    Vaccine Type: Tdap    Site: right deltoid    Mfr: GlaxoSmithKline    Dose: 0.5 ml    Route: IM    Given by: Georgette Dover CMA    Exp. Date: 04/19/2012    Lot #: [id number]    VIS given: 05/18/08 version given March 20, 2010.

## 2010-08-02 NOTE — Assessment & Plan Note (Signed)
Summary: FOR GOUT//PH   Vital Signs:  Patient profile:   70 year old male Height:      73.25 inches (186.06 cm) Weight:      263.50 pounds (119.77 kg) BMI:     34.65 Temp:     97.9 degrees F (36.61 degrees C) oral Resp:     15 per minute BP sitting:   140 / 80  (left arm)  Vitals Entered By: [name] CMA (July 06, 2010 12:20 PM) CC: C/O gout of his right great toe./kb, Type 2 diabetes mellitus follow-up Is Patient Diabetic? Yes Pain Assessment Patient in pain? yes     Location: toe Onset of pain  yesterday Comments Patient notes that he has had gout attack 3x in 7 weeks. He notes that it is always of the right great toe and no other areas.    Primary Care Provider:  Unice Cobble MD  CC:  C/O gout of his right great toe./kb and Type 2 diabetes mellitus follow-up.  History of Present Illness:  Gout flared 10 days after Colcrys was completed; he restricts High Fructose Corn Syrup  sugar. Allopurinol has not been restarted due to gout flare. A1c is 6.5%;Diabetes control adequate. FBS are in 90s.  The patient reports polyuria from diuretic,rare self managed hypoglycemia while playing golf, and weight loss of 8-10#, but denies polydipsia and blurred vision.  The patient denies the following symptoms: poor wound healing, vision loss, and foot ulcer.  The patient has been measuring capillary blood glucose only  before breakfast.    Current Medications (verified): 1)  Toprol Xl 100 Mg Tb24 (Metoprolol Succinate) .[name].. 1 1/2 By Mouth Once Daily 2)  Lipitor 40 Mg Tabs (Atorvastatin Calcium) .... Take 1 Tablet By Mouth Once A Day 3)  Aspirin Ec 81 Mg Tbec (Aspirin) .... Take 1 Tablet By Mouth Every Morning 4)  Zetia 10 Mg Tabs (Ezetimibe) .... Take 1 Tablet By Mouth Once A Day 5)  Allopurinol 300 Mg Tabs (Allopurinol) .... Take 1 Tablet By Mouth Once A Day 6)  Metformin Hcl 1000 Mg Tabs (Metformin Hcl) .[name].. 1 By Mouth Two Times A Day 7)  Freestyle Lancets  Misc (Lancets) .... Check  Blood Sugar Daily 8)  Freestyle Lite Test  Strp (Glucose Blood) .... Test Once Daily 9)  Amlodipine Besylate 5 Mg Tabs (Amlodipine Besylate) .[name].. 1 By Mouth Daily 10)  Furosemide 40 Mg  Tabs (Furosemide) .... 2 Tab Qam...1 Tab Qpm 11)  Klor-Con M20 20 Meq  Tbcr (Potassium Chloride Crys Cr) .... 2 Tabs Qam..1 Tab Qpm 12)  Vitamin C 500 Mg Tabs (Ascorbic Acid) .[name].. 1 By Mouth Once Daily 13)  Citracal Petites/vitamin D 200-250 Mg-Unit Tabs (Calcium Citrate-Vitamin D) .[name].. 1 By Mouth Two Times A Day 14)  Glimepiride 2 Mg Tabs (Glimepiride) .[name].. 1 Q Am 15)  Zegerid 40-1100 Mg Caps (Omeprazole-Sodium Bicarbonate) .[name].. 1 Each Am 16)  Nitroglycerin 0.4 Mg Subl (Nitroglycerin) .... One Tablet Under Tongue Every 5 Minutes As Needed For Chest Pain---May Repeat Times Three 17)  Fluticasone Propionate 50 Mcg/act Susp (Fluticasone Propionate) .[name].. 1 Spray Two Times A Day 18)  Align  Caps (Probiotic Product) .[name].. 1 Cap Once Daily As Needed 19)  Cpap and 2liters O2 At Night .... At Bedtime 20)  Nebulizer Compressor  Misc (Nebulizers) .... Use With Proventil For Nebulizer Treatment Once Daily 21)  Gabapentin 100 Mg Caps (Gabapentin) .[name].. 1 At Bedtime As Needed 22)  Colcrys 0.6 Mg Tabs (Colchicine) .Marland KitchenMarland KitchenMarland Kitchen 1  Once Daily ( Hold Lipitor While On This) 23)  Promethazine Hcl 25 Mg Tabs (Promethazine Hcl) .[name].. 1 By Mouth Qid As Needed  Allergies (verified): 1)  ! * Benazepril 2)  ! * Hctz  Physical Exam  General:  Uncomfortable but in no acute distress; alert,appropriate and cooperative throughout examination Pulses:  R and L  dorsalis pedis and posterior tibial pulses are full and equal bilaterally Extremities:  Classic podagra R great toe Neurologic:  sensation intact to light touch over feet.   Skin:  Intact without suspicious lesions or rashes. Blanching erythema R graet toe   Impression & Recommendations:  Problem # 1:  GOUT (ICD-274.9)  acute flare His updated medication list for this problem  includes:    Allopurinol 300 Mg Tabs (Allopurinol) .[name]... Take 1 tablet by mouth once a day    Colcrys 0.6 Mg Tabs (Colchicine) .[name]... 1 once daily ( hold lipitor while on this)  Orders: Rheumatology Referral (Rheumatology)  Problem # 2:  DIABETES MELLITUS, TYPE II (ICD-250.00) adequate control His updated medication list for this problem includes:    Aspirin Ec 81 Mg Tbec (Aspirin) .[name]... Take 1 tablet by mouth every morning    Metformin Hcl 1000 Mg Tabs (Metformin hcl) .[name]... 1 by mouth two times a day    Glimepiride 2 Mg Tabs (Glimepiride) .[name]... 1 q am  Complete Medication List: 1)  Toprol Xl 100 Mg Tb24 (Metoprolol succinate) .[name].. 1 1/2 by mouth once daily 2)  Lipitor 40 Mg Tabs (Atorvastatin calcium) .... Take 1 tablet by mouth once a day 3)  Aspirin Ec 81 Mg Tbec (Aspirin) .... Take 1 tablet by mouth every morning 4)  Zetia 10 Mg Tabs (Ezetimibe) .... Take 1 tablet by mouth once a day 5)  Allopurinol 300 Mg Tabs (Allopurinol) .... Take 1 tablet by mouth once a day 6)  Metformin Hcl 1000 Mg Tabs (Metformin hcl) .[name].. 1 by mouth two times a day 7)  Freestyle Lancets Misc (Lancets) .... Check blood sugar daily 8)  Freestyle Lite Test Strp (Glucose blood) .... Test once daily 9)  Amlodipine Besylate 5 Mg Tabs (Amlodipine besylate) .[name].. 1 by mouth daily 10)  Furosemide 40 Mg Tabs (Furosemide) .... 2 tab qam...1 tab qpm 11)  Klor-con M20 20 Meq Tbcr (Potassium chloride crys cr) .... 2 tabs qam..1 tab qpm 12)  Vitamin C 500 Mg Tabs (Ascorbic acid) .[name].. 1 by mouth once daily 13)  Citracal Petites/vitamin D 200-250 Mg-unit Tabs (Calcium citrate-vitamin d) .[name].. 1 by mouth two times a day 14)  Glimepiride 2 Mg Tabs (Glimepiride) .[name].. 1 q am 15)  Zegerid 40-1100 Mg Caps (Omeprazole-sodium bicarbonate) .[name].. 1 each am 16)  Nitroglycerin 0.4 Mg Subl (Nitroglycerin) .... One tablet under tongue every 5 minutes as needed for chest pain---may repeat times three 17)  Fluticasone Propionate 50 Mcg/act  Susp (Fluticasone propionate) .[name].. 1 spray two times a day 18)  Align Caps (Probiotic product) .[name].. 1 cap once daily as needed 19)  Cpap and 2liters O2 At Night  .... At bedtime 20)  Nebulizer Compressor Misc (Nebulizers) .... Use with proventil for nebulizer treatment once daily 21)  Gabapentin 100 Mg Caps (Gabapentin) .[name].. 1 at bedtime as needed 22)  Colcrys 0.6 Mg Tabs (Colchicine) .[name].. 1 once daily ( hold lipitor while on this) 23)  Promethazine Hcl 25 Mg Tabs (Promethazine hcl) .[name].. 1 by mouth qid as needed  Other Orders: Misc. Referral (Misc. Ref)  Patient Instructions: 1)  Hold Lipitor while  on the Colcrys. Prescriptions: COLCRYS 0.6 MG TABS (COLCHICINE) 1 once daily ( hold Lipitor while on this)  #30 x 2   Entered and Authorized by:   Unice Cobble MD   Signed by:   Unice Cobble MD on 07/06/2010   Method used:   Print then Give to Patient   RxID:   [id number]    Orders Added: 1)  Est. Patient Level III [id number] 2)  Rheumatology Referral [Rheumatology] 3)  Misc. Referral [Misc. Ref]

## 2010-08-02 NOTE — Assessment & Plan Note (Signed)
Summary: GOUT RETURNED IN RT GREAT TOE/RH......   Vital Signs:  Patient profile:   71 year old male Weight:      264 pounds BMI:     34.72 Temp:     98.3 degrees F oral Pulse rate:   80 / minute Resp:     15 per minute BP sitting:   130 / 66  (left arm) Cuff size:   large  Vitals Entered By: Georgette Dover CMA (May 17, 2010 4:47 PM) CC: Gout in big toe-right foot returned   Primary Care Provider:  Unice Cobble MD  CC:  Gout in big toe-right foot returned.  History of Present Illness: Indocin relieved gout  after 4 days ; he completed it 11/12 & gout came back in R great toe  11/16. Lab reviewed : UA 10.2. He is restricting HFCS sugar.  Current Medications (verified): 1)  Toprol Xl 100 Mg Tb24 (Metoprolol Succinate) .[name].. 1 1/2 By Mouth Once Daily 2)  Lipitor 40 Mg Tabs (Atorvastatin Calcium) .... Take 1 Tablet By Mouth Once A Day 3)  Aspirin Ec 81 Mg Tbec (Aspirin) .... Take 1 Tablet By Mouth Every Morning 4)  Zetia 10 Mg Tabs (Ezetimibe) .... Take 1 Tablet By Mouth Once A Day 5)  Allopurinol 300 Mg Tabs (Allopurinol) .... Take 1 Tablet By Mouth Once A Day 6)  Metformin Hcl 1000 Mg Tabs (Metformin Hcl) .[name].. 1 By Mouth Two Times A Day 7)  Freestyle Lancets  Misc (Lancets) .... Check Blood Sugar Daily 8)  Freestyle Lite Test  Strp (Glucose Blood) .... Test Once Daily 9)  Amlodipine Besylate 5 Mg Tabs (Amlodipine Besylate) .[name].. 1 By Mouth Daily 10)  Furosemide 40 Mg  Tabs (Furosemide) .... 2 Tab Qam...1 Tab Qpm 11)  Klor-Con M20 20 Meq  Tbcr (Potassium Chloride Crys Cr) .... 2 Tabs Qam..1 Tab Qpm 12)  Vitamin C 500 Mg Tabs (Ascorbic Acid) .[name].. 1 By Mouth Once Daily 13)  Citracal Petites/vitamin D 200-250 Mg-Unit Tabs (Calcium Citrate-Vitamin D) .[name].. 1 By Mouth Two Times A Day 14)  Glimepiride 2 Mg Tabs (Glimepiride) .[name].. 1 Q Am 15)  Zegerid 40-1100 Mg Caps (Omeprazole-Sodium Bicarbonate) .[name].. 1 Each Am 16)  Nitroglycerin 0.4 Mg Subl (Nitroglycerin) .... One Tablet Under  Tongue Every 5 Minutes As Needed For Chest Pain---May Repeat Times Three 17)  Fluticasone Propionate 50 Mcg/act Susp (Fluticasone Propionate) .[name].. 1 Spray Two Times A Day 18)  Align  Caps (Probiotic Product) .[name].. 1 Cap Once Daily As Needed 19)  Cpap and 2liters O2 At Night .... At Bedtime 20)  Nebulizer Compressor  Misc (Nebulizers) .... Use With Proventil For Nebulizer Treatment Once Daily 21)  Gabapentin 100 Mg Caps (Gabapentin) .[name].. 1 At Bedtime As Needed  Allergies: 1)  ! * Benazepril 2)  ! * Hctz  Physical Exam  General:  uncomfortable but in no acute distress; alert,appropriate and cooperative throughout examination Pulses:  R and L dorsalis pedis and posterior tibial pulses are full and equal bilaterally Extremities:  Classic podagra R great toe Skin:  Mild erythema R great toe   Impression & Recommendations:  Problem # 1:  GOUT (ICD-274.9)  His updated medication list for this problem includes:    Allopurinol 300 Mg Tabs (Allopurinol) .[name]... Take 1 tablet by mouth once a day    Colcrys 0.6 Mg Tabs (Colchicine) .[name]... 1 once daily ( hold lipitor while on this)  Complete Medication List: 1)  Toprol Xl 100 Mg Tb24 (Metoprolol succinate) .Marland KitchenMarland KitchenMarland Kitchen  1 1/2 by mouth once daily 2)  Lipitor 40 Mg Tabs (Atorvastatin calcium) .... Take 1 tablet by mouth once a day 3)  Aspirin Ec 81 Mg Tbec (Aspirin) .... Take 1 tablet by mouth every morning 4)  Zetia 10 Mg Tabs (Ezetimibe) .... Take 1 tablet by mouth once a day 5)  Allopurinol 300 Mg Tabs (Allopurinol) .... Take 1 tablet by mouth once a day 6)  Metformin Hcl 1000 Mg Tabs (Metformin hcl) .[name].. 1 by mouth two times a day 7)  Freestyle Lancets Misc (Lancets) .... Check blood sugar daily 8)  Freestyle Lite Test Strp (Glucose blood) .... Test once daily 9)  Amlodipine Besylate 5 Mg Tabs (Amlodipine besylate) .[name].. 1 by mouth daily 10)  Furosemide 40 Mg Tabs (Furosemide) .... 2 tab qam...1 tab qpm 11)  Klor-con M20 20 Meq Tbcr (Potassium chloride  crys cr) .... 2 tabs qam..1 tab qpm 12)  Vitamin C 500 Mg Tabs (Ascorbic acid) .[name].. 1 by mouth once daily 13)  Citracal Petites/vitamin D 200-250 Mg-unit Tabs (Calcium citrate-vitamin d) .[name].. 1 by mouth two times a day 14)  Glimepiride 2 Mg Tabs (Glimepiride) .[name].. 1 q am 15)  Zegerid 40-1100 Mg Caps (Omeprazole-sodium bicarbonate) .[name].. 1 each am 16)  Nitroglycerin 0.4 Mg Subl (Nitroglycerin) .... One tablet under tongue every 5 minutes as needed for chest pain---may repeat times three 17)  Fluticasone Propionate 50 Mcg/act Susp (Fluticasone propionate) .[name].. 1 spray two times a day 18)  Align Caps (Probiotic product) .[name].. 1 cap once daily as needed 19)  Cpap and 2liters O2 At Night  .... At bedtime 20)  Nebulizer Compressor Misc (Nebulizers) .... Use with proventil for nebulizer treatment once daily 21)  Gabapentin 100 Mg Caps (Gabapentin) .[name].. 1 at bedtime as needed 22)  Colcrys 0.6 Mg Tabs (Colchicine) .[name].. 1 once daily ( hold lipitor while on this)  Patient Instructions: 1)  Restrict HFCS sugar as much as possible.Fasting Uric Acid, BMET  level in 4 weeks ( 274.9, 995.20, 250.00). Prescriptions: COLCRYS 0.6 MG TABS (COLCHICINE) 1 once daily ( hold Lipitor while on this)  #30 x 0   Entered and Authorized by:   Unice Cobble MD   Signed by:   Unice Cobble MD on 05/17/2010   Method used:   Print then Give to Patient   RxID:   [id number]    Orders Added: 1)  Est. Patient Level III [id number]

## 2010-08-02 NOTE — Assessment & Plan Note (Signed)
Summary: 6 mo f/u ./cy  Medications Added NEBULIZER COMPRESSOR  MISC (NEBULIZERS) use with proventil for nebulizer treatment once daily      Allergies Added:   Visit Type:  Follow-up Primary Provider:  Unice Cobble MD  CC:  no cardiac complaints.  History of Present Illness: [name] today for his coronary disease, lower extremity edema, and mixed hyperlipidemia.  His left knee is his biggest problem. He usually had to cut back on exercise but still plays a lot of golf. He has had 14 holes in ones in his life.  His swelling is improved dramatically. He is now on CPAP to Dr. Linna Darner which is getting a lot more energy, help him sleep better at night, has improved his blood pressure and breathing during the day. He has lost about 5 pounds of fluid.  Clinical Reports Reviewed:  CXR:  08/16/2009: CXR Results:   Clinical Data: Bronchitis    CHEST - 2 VIEW    Comparison: Chest radiograph 07/18/2008    Findings: Sternotomy wires overlie normal mediastinum and cardiac   silhouette.  There is mild flattening of the hemidiaphragms which   is similar to prior.  There is coarsened central bronchovascular   markings which is also similar to prior.  No focal consolidation.   No pneumothorax.  There is osteophytosis of thoracic spine.    IMPRESSION:    1.  No significant change from prior.   2.  Hyperinflation suggests emphysema.   3.  Coarsened central bronchitic markings is similar to prior.    Read By:  Suzy Bouchard,  M.D.   Released By:  Suzy Bouchard,  M.D.  07/18/2008: CXR Results:   Findings: Trachea is midline.  Heart size normal.  Mild biapical   scarring.  Lungs otherwise clear.  No pleural fluid.    IMPRESSION:   No acute findings.    Read By:  Luretha Rued.,  M.D.  03/28/2000: CXR Results:   IN THE INTERVAL SINCE THE PRIOR FILM, BILATERAL CHEST   TUBES HAVE BEEN REMOVED.  NO PNEUMOTHORAX.  MINIMAL   POSTERIOR EFFUSIONS ON THE LATERAL PROJECTION.   BILATERAL   LOWER LOBE ATELECTASIS.  NO EDEMA.   IMPRESSION:  NO PNEUMOTHORAX AFTER CHEST TUBE REMOVAL.   MINIMAL BILATERAL EFFUSIONS.   TRANSCRIBED DATE:  [place] [id number] T.   Kathlene Cote, M.D.  03/28/00                                           Released [id number]   T. Kathlene Cote, M.D.  Cardiac Cath:  03/21/2000: Cardiac Cath Findings:  CONCLUSIONS: 1. Normal left ventricular function. 2. Patent subclavian. 3. Progression of disease involving both the left anterior descending and    right coronary arteries.  DISPOSITION:  We have discussed the various options.  I have reviewed this with Dr. Vicenta Aly.  The LAD has a fair amount of segmental disease between the tight stenosis, the distal stenosis with plaquing crossing the diagonal. The diagonal itself is diseased.  The right is clearly diseased.  We will discuss options but my leaning would be in the direction of revascularization surgery.  I plan to get a surgical consult if the patient is agreeable to this.  There is evidence of collateralization to the distal LAD from the right coronary artery. DD:  03/21/00 TD:  03/21/00 Job: [id number]  Nuclear Study:  04/11/2008:  Meds administered: NONE  Impression:  EF:  60%  Exercise Capacity:  Fair exercise capacity  Blood Pressure Response:  Normal blood pressure response Clinical Symptoms:  No chest pain or dyspnea ECG Impression:  No significant ST segment change suggestive of ischemia  Overall Impression:  There is inferior thinning but no sign of scar or ischemia.  Kirk Ruths, MD  02/05/2006:  Meds administered: NONE  Impression:    EF:  61%  Exercise Capacity:  Good exercise capacity Blood Pressure Response:  Hypertensive blood pressure response Clinical Symptoms: SOB ECG Impression:  No ST abnormalities suggestive of ischemia  Overall Impression:  Normal stress nuclear study.  There is no scar or  ischemia.  There is a hypertensive response to stress.  Septal motion is c/w prior CABG   Current Medications (verified): 1)  Toprol Xl 100 Mg Tb24 (Metoprolol Succinate) .[name].. 1 1/2 By Mouth Once Daily 2)  Lipitor 40 Mg Tabs (Atorvastatin Calcium) .... Take 1 Tablet By Mouth Once A Day 3)  Aspirin Ec 81 Mg Tbec (Aspirin) .... Take 1 Tablet By Mouth Every Morning 4)  Zetia 10 Mg Tabs (Ezetimibe) .... Take 1 Tablet By Mouth Once A Day 5)  Allopurinol 300 Mg Tabs (Allopurinol) .... Take 1 Tablet By Mouth Once A Day 6)  Metformin Hcl 1000 Mg Tabs (Metformin Hcl) .[name].. 1 By Mouth Two Times A Day 7)  Freestyle Lancets  Misc (Lancets) .... Check Blood Sugar Daily 8)  Freestyle Lite Test  Strp (Glucose Blood) .... Test Once Daily 9)  Amlodipine Besylate 5 Mg Tabs (Amlodipine Besylate) .[name].. 1 By Mouth Daily 10)  Furosemide 40 Mg  Tabs (Furosemide) .... 2 Tab Qam...1 Tab Qpm 11)  Klor-Con M20 20 Meq  Tbcr (Potassium Chloride Crys Cr) .... 2 Tabs Qam..1 Tab Qpm 12)  Vitamin C 500 Mg Tabs (Ascorbic Acid) .[name].. 1 By Mouth Once Daily 13)  Citracal Petites/vitamin D 200-250 Mg-Unit Tabs (Calcium Citrate-Vitamin D) .[name].. 1 By Mouth Two Times A Day 14)  Glimepiride 2 Mg Tabs (Glimepiride) .[name].. 1 Q Am 15)  Zegerid 40-1100 Mg Caps (Omeprazole-Sodium Bicarbonate) .[name].. 1 Each Am 16)  Nitroglycerin 0.4 Mg Subl (Nitroglycerin) .... One Tablet Under Tongue Every 5 Minutes As Needed For Chest Pain---May Repeat Times Three 17)  Fluticasone Propionate 50 Mcg/act Susp (Fluticasone Propionate) .[name].. 1 Spray Two Times A Day 18)  Align  Caps (Probiotic Product) .[name].. 1 Cap Once Daily 19)  Proventil Hfa 108 (90 Base) Mcg/act Aers (Albuterol Sulfate) .[name].. 1-2 Puffs Every 4 Hrs As Needed For Cough, Sob 20)  Cpap and 2liters O2 At Night .... At Bedtime 21)  Nebulizer Compressor  Misc (Nebulizers) .... Use With Proventil For Nebulizer Treatment Once Daily 22)  Gabapentin 100 Mg Caps (Gabapentin) .[name].. 1 At Bedtime  Allergies  (verified): 1)  ! * Benazepril 2)  ! * Hctz  Past History:  Past Medical History: Last updated: 11/26/2008 Gout Esophageal structure Coronary artery disease Mixed hyperlipidemia Obesity  Past Surgical History: Last updated: 05/29/2007 Angioplasty 1993 Cath and stent placed 05/1997 Vastectomy 5 bypass surgery 2001  Family History: Last updated: 05/29/2007 Father:  Mother: Pancreatic cancer Siblings: MGM-DM  Social History: Last updated: 11/26/2008 Full Time-[employer]  Single-previously married Tobacco Use -  No.  Alcohol Use - no  Risk Factors: Alcohol Use: <1 (05/29/2007)  Risk Factors: Smoking Status: never (11/26/2008)  Review of Systems       negative other than history of present illness  Vital Signs:  Patient profile:   70 year old male Height:      70 inches Weight:      270 pounds BMI:     38.88 Pulse rate:   80 / minute BP sitting:   142 / 60  (left arm) Cuff size:   large  Vitals Entered By: Mignon Pine, RMA (January 30, 2010 4:31 PM)  Physical Exam  General:  he is clearly lost some weight. Head:  normocephalic and atraumatic Eyes:  wears glasses Neck:  Neck supple, no JVD. No masses, thyromegaly or abnormal cervical nodes. Chest Gyselle Matthew:  no deformities or breast masses noted Lungs:  Clear bilaterally to auscultation and percussion. Heart:  PMI poorly appreciated, normal S1-S2, no gallop Msk:  decreased ROM.   Pulses:  pulses normal in all 4 extremities Extremities:  1+ left pedal edema and 1+ right pedal edema.   Neurologic:  Alert and oriented x 3. Skin:  Intact without lesions or rashes. Psych:  Normal affect.   Impression & Recommendations:  Problem # 1:  CAD, ARTERY BYPASS GRAFT (ICD-414.04) Assessment Unchanged  His updated medication list for this problem includes:    Toprol Xl 100 Mg Tb24 (Metoprolol succinate) .[name]... 1 1/2 by mouth once daily    Aspirin Ec 81 Mg Tbec (Aspirin) .[name]... Take 1 tablet by mouth every  morning    Amlodipine Besylate 5 Mg Tabs (Amlodipine besylate) .[name]... 1 by mouth daily    Nitroglycerin 0.4 Mg Subl (Nitroglycerin) ..... One tablet under tongue every 5 minutes as needed for chest pain---may repeat times three  Problem # 2:  DIABETES MELLITUS, TYPE II (ICD-250.00)  His updated medication list for this problem includes:    Aspirin Ec 81 Mg Tbec (Aspirin) .[name]... Take 1 tablet by mouth every morning    Metformin Hcl 1000 Mg Tabs (Metformin hcl) .[name]... 1 by mouth two times a day    Glimepiride 2 Mg Tabs (Glimepiride) .[name]... 1 q am  Problem # 3:  HYPERTENSION, BENIGN (ICD-401.1) Assessment: Improved  His updated medication list for this problem includes:    Toprol Xl 100 Mg Tb24 (Metoprolol succinate) .[name]... 1 1/2 by mouth once daily    Aspirin Ec 81 Mg Tbec (Aspirin) .[name]... Take 1 tablet by mouth every morning    Amlodipine Besylate 5 Mg Tabs (Amlodipine besylate) .[name]... 1 by mouth daily    Furosemide 40 Mg Tabs (Furosemide) .[name]... 2 tab qam...1 tab qpm  Problem # 4:  EDEMA (ICD-782.3) Assessment: Improved  Problem # 5:  OBESITY (ICD-278.00) Assessment: Improved  Problem # 6:  HYPERLIPIDEMIA, MIXED (ICD-272.2) I will arrange for him to have fasting lipids and LFTs in November. No change in meds. His updated medication list for this problem includes:    Lipitor 40 Mg Tabs (Atorvastatin calcium) .[name]... Take 1 tablet by mouth once a day    Zetia 10 Mg Tabs (Ezetimibe) .[name]... Take 1 tablet by mouth once a day  Patient Instructions: 1)  Your physician recommends that you schedule a follow-up appointment in: 6 months with Dr. Verl Blalock 2)  Your physician recommends that you return for a FASTING lipid profile and liver function test  on NOVEMBER 4,2011 3)  Your physician recommends that you continue on your current medications as directed.  Please refer to the Current Medication list given to you today.

## 2010-08-02 NOTE — Assessment & Plan Note (Signed)
Summary: CPAP CHECK//PH   Vital Signs:  Patient profile:   70 year old male Weight:      262.0 pounds BMI:     34.46 O2 Sat:      94 % on Room air Temp:     97.7 degrees F oral Pulse rate:   76 / minute Resp:     16 per minute BP sitting:   140 / 78  (left arm) Cuff size:   large  Vitals Entered By: Georgette Dover CMA (July 25, 2010 3:59 PM)  O2 Flow:  Room air CC: CPAP fact-to-face discussion , COPD follow-up   Primary Care Provider:  Unice Cobble MD  CC:  CPAP fact-to-face discussion  and COPD follow-up.  History of Present Illness:    Sleep Apnea/ supplemnetal O2 re-assessment as required by Medicare: The patient reports heat intolerance, but denies shortness of breath, chest tightness, wheezing, cough, increased sputum, and nocturnal awakening while on 2 L/miin O2. Also severe nocturnal  headaches have resolved on O2. If he takes a nap during the day w/o O2 ; he'll awahen with a headache.  With O2 he sleeps 5 hrs continuously & only wake to go to BR ; prior to O2 he was awakening every hour due to the headaches.   Current Medications (verified): 1)  Toprol Xl 100 Mg Tb24 (Metoprolol Succinate) .[name].. 1 1/2 By Mouth Once Daily 2)  Lipitor 40 Mg Tabs (Atorvastatin Calcium) .... Take 1 Tablet By Mouth Once A Day 3)  Aspirin Ec 81 Mg Tbec (Aspirin) .... Take 1 Tablet By Mouth Every Morning 4)  Zetia 10 Mg Tabs (Ezetimibe) .... Take 1 Tablet By Mouth Once A Day 5)  Allopurinol 300 Mg Tabs (Allopurinol) .... 1/2 By Mouth X 1 Week, Then Increase To 1 By Mouth Once Daily 6)  Metformin Hcl 1000 Mg Tabs (Metformin Hcl) .[name].. 1 By Mouth Two Times A Day 7)  Freestyle Lancets  Misc (Lancets) .... Check Blood Sugar Daily 8)  Freestyle Lite Test  Strp (Glucose Blood) .... Test Once Daily 9)  Amlodipine Besylate 5 Mg Tabs (Amlodipine Besylate) .[name].. 1 By Mouth Daily 10)  Furosemide 40 Mg  Tabs (Furosemide) .... 2 Tab Qam...1 Tab Qpm 11)  Klor-Con M20 20 Meq  Tbcr (Potassium Chloride Crys  Cr) .... 2 Tabs Qam..1 Tab Qpm 12)  Vitamin C 500 Mg Tabs (Ascorbic Acid) .[name].. 1 By Mouth Once Daily 13)  Citracal Petites/vitamin D 200-250 Mg-Unit Tabs (Calcium Citrate-Vitamin D) .[name].. 1 By Mouth Two Times A Day 14)  Glimepiride 2 Mg Tabs (Glimepiride) .[name].. 1 Q Am 15)  Zegerid 40-1100 Mg Caps (Omeprazole-Sodium Bicarbonate) .[name].. 1 Each Am 16)  Nitroglycerin 0.4 Mg Subl (Nitroglycerin) .... One Tablet Under Tongue Every 5 Minutes As Needed For Chest Pain---May Repeat Times Three 17)  Fluticasone Propionate 50 Mcg/act Susp (Fluticasone Propionate) .[name].. 1 Spray Two Times A Day 18)  Align  Caps (Probiotic Product) .[name].. 1 Cap Once Daily As Needed 19)  Cpap and 2liters O2 At Night .... At Bedtime 20)  Nebulizer Compressor  Misc (Nebulizers) .... Use With Proventil For Nebulizer Treatment Once Daily 21)  Gabapentin 100 Mg Caps (Gabapentin) .[name].. 1 At Bedtime As Needed 22)  Colcrys 0.6 Mg Tabs (Colchicine) .[name].. 1 Once Daily ( Hold Lipitor While On This)  Allergies: 1)  ! * Benazepril 2)  ! * Hctz  Review of Systems Allergy:  Complains of itching eyes and sneezing; Rhinitis since 01/21; Rx: Neti pot two times a  day & Benadryl.[name]  Physical Exam  General:  well-nourished,in no acute distress; alert,appropriate and cooperative throughout examination Eyes:  No corneal or conjunctival inflammation noted but watery eyes.  Ears:  R ear normal.   Wax on L Nose:  External nasal examination shows no deformity or inflammation. Nasal mucosa are pink and moist without lesions or exudates. Slight hyponasal speech Mouth:  Oral mucosa and oropharynx without lesions or exudates.  Teeth in good repair.pharyngeal crowded Lungs:  Normal respiratory effort, chest expands symmetrically. Lungs are clear to auscultation, no crackles or wheezes but BS decreased Heart:  normal rate, regular rhythm, no gallop, no rub, no JVD, and grade 1/2 -1 /6 systolic murmur.   Cervical Nodes:  No lymphadenopathy noted Axillary Nodes:   No palpable lymphadenopathy   Impression & Recommendations:  Problem # 1:  SLEEP APNEA (ICD-780.57) Oxygen required for sleep hygiene & to prevent  nocturnal headaches  Problem # 2:  RHINITIS (ICD-477.9)  The following medications were removed from the medication list:    Promethazine Hcl 25 Mg Tabs (Promethazine hcl) .[name]... 1 by mouth qid as needed His updated medication list for this problem includes:    Fluticasone Propionate 50 Mcg/act Susp (Fluticasone propionate) .[name]... 1 spray two times a day    Loratadine 10 Mg Tabs (Loratadine) .[name]... 1 once daily as needed for allergies  Orders: Prescription Created Electronically [id number]  Complete Medication List: 1)  Toprol Xl 100 Mg Tb24 (Metoprolol succinate) .[name].. 1 1/2 by mouth once daily 2)  Lipitor 40 Mg Tabs (Atorvastatin calcium) .... Take 1 tablet by mouth once a day 3)  Aspirin Ec 81 Mg Tbec (Aspirin) .... Take 1 tablet by mouth every morning 4)  Zetia 10 Mg Tabs (Ezetimibe) .... Take 1 tablet by mouth once a day 5)  Allopurinol 300 Mg Tabs (Allopurinol) .... 1/2 by mouth x 1 week, then increase to 1 by mouth once daily 6)  Metformin Hcl 1000 Mg Tabs (Metformin hcl) .[name].. 1 by mouth two times a day 7)  Freestyle Lancets Misc (Lancets) .... Check blood sugar daily 8)  Freestyle Lite Test Strp (Glucose blood) .... Test once daily 9)  Amlodipine Besylate 5 Mg Tabs (Amlodipine besylate) .[name].. 1 by mouth daily 10)  Furosemide 40 Mg Tabs (Furosemide) .... 2 tab qam...1 tab qpm 11)  Klor-con M20 20 Meq Tbcr (Potassium chloride crys cr) .... 2 tabs qam..1 tab qpm 12)  Vitamin C 500 Mg Tabs (Ascorbic acid) .[name].. 1 by mouth once daily 13)  Citracal Petites/vitamin D 200-250 Mg-unit Tabs (Calcium citrate-vitamin d) .[name].. 1 by mouth two times a day 14)  Glimepiride 2 Mg Tabs (Glimepiride) .[name].. 1 q am 15)  Zegerid 40-1100 Mg Caps (Omeprazole-sodium bicarbonate) .[name].. 1 each am 16)  Nitroglycerin 0.4 Mg Subl (Nitroglycerin) .... One tablet under  tongue every 5 minutes as needed for chest pain---may repeat times three 17)  Fluticasone Propionate 50 Mcg/act Susp (Fluticasone propionate) .[name].. 1 spray two times a day 18)  Align Caps (Probiotic product) .[name].. 1 cap once daily as needed 19)  Cpap and 2liters O2 At Night  .... At bedtime 20)  Nebulizer Compressor Misc (Nebulizers) .... Use with proventil for nebulizer treatment once daily 21)  Gabapentin 100 Mg Caps (Gabapentin) .[name].. 1 at bedtime as needed 22)  Colcrys 0.6 Mg Tabs (Colchicine) .[name].. 1 once daily ( hold lipitor while on this) 23)  Loratadine 10 Mg Tabs (Loratadine) .[name].. 1 once daily as needed for allergies  Patient Instructions: 1)  Neti pot once daily - two times a day as needed for congestion , followed by Fluticasone spray. Prescriptions: LORATADINE 10 MG TABS (LORATADINE) 1 once daily as needed for allergies  #30 x 11   Entered and Authorized by:   Unice Cobble MD   Signed by:   Unice Cobble MD on 07/25/2010   Method used:   Electronically to        [id number] (retail)       [address]       [place], [place]  [address]       Ph: [phone] or [phone]       Fax: [phone]   RxID:   [id number]    Orders Added: 1)  Prescription Created Electronically [id number] 2)  Est. Patient Level III [id number]

## 2010-08-02 NOTE — Letter (Signed)
Summary: Generic Letter  [facility]   [place], [place] [address]   Phone: [phone]  Fax: [phone]    07/20/2010  [facility] LIPU[facility] [address] [place], [place]  [address]    As per history , Mr. Safal Kaster. [place] , has been compliant with nocturnal oxygen for his documemted  nocturnal desaturation with dramatic subjective improvement in duration & quality of sleep. Pascal Lux MD       Sincerely,   Unice Cobble, MD

## 2010-08-02 NOTE — Assessment & Plan Note (Signed)
Summary: TINGLING IN LEFT THIGH/RH.......[name]   Vital Signs:  Patient profile:   70 year old male Weight:      272.8 pounds Temp:     98.2 degrees F oral Pulse rate:   84 / minute Resp:     17 per minute BP sitting:   150 / 70  (left arm) Cuff size:   large  Vitals Entered By: Georgette Dover (Nov 02, 2009 11:48 AM) CC: Tingling in left tigh Comments REVIEWED MED LIST, PATIENT AGREED DOSE AND INSTRUCTION CORRECT    Primary Care Provider:  Unice Cobble MD  CC:  Tingling in left tigh.  History of Present Illness: Tingling , "like cold water " L thigh X 4-5 weeks when in supine position. Occasionally it occursafter standing for 4-5 minutes.  Allergies: 1)  ! * Benazepril 2)  ! * Hctz  Review of Systems MS:  Complains of joint pain; denies joint redness, joint swelling, low back pain, mid back pain, and thoracic pain; Knees iced after golf. Derm:  Denies changes in color of skin, lesion(s), and rash. Neuro:  Denies brief paralysis, numbness, and weakness.  Physical Exam  General:  in no acute distress; alert,appropriate and cooperative throughout examination Extremities:  No clubbing, cyanosis.1/2+ left pedal edema and 1/2+ right pedal edema @ sock line.  Crepitus L knee >R. Neg SLR Neurologic:  strength  & DTRs normal in all extremities and  heel/toe gait normal except limping due to L knee.   Light touch WNL @ lateral thighs   Impression & Recommendations:  Problem # 1:  PARESTHESIA (ICD-782.0)  L-4 distribution when supine; probable osteophyte related  Orders: Prescription Created Electronically [phone])  Complete Medication List: 1)  Toprol Xl 100 Mg Tb24 (Metoprolol succinate) .[name].. 1 1/2 by mouth once daily 2)  Lipitor 40 Mg Tabs (Atorvastatin calcium) .... Take 1 tablet by mouth once a day 3)  Aspirin Ec 81 Mg Tbec (Aspirin) .... Take 1 tablet by mouth every morning 4)  Zetia 10 Mg Tabs (Ezetimibe) .... Take 1 tablet by mouth once a day 5)  Allopurinol 300 Mg Tabs  (Allopurinol) .... Take 1 tablet by mouth once a day 6)  Metformin Hcl 1000 Mg Tabs (Metformin hcl) .[name].. 1 by mouth two times a day 7)  Freestyle Lancets Misc (Lancets) .... Check blood sugar daily 8)  Freestyle Lite Test Strp (Glucose blood) .... Test once daily 9)  Amlodipine Besylate 5 Mg Tabs (Amlodipine besylate) .[name].. 1 by mouth daily 10)  Furosemide 40 Mg Tabs (Furosemide) .... 2 tab qam...1 tab qpm 11)  Klor-con M20 20 Meq Tbcr (Potassium chloride crys cr) .... 2 tabs qam..1 tab qpm 12)  Vitamin C 500 Mg Tabs (Ascorbic acid) .[name].. 1 by mouth once daily 13)  Citracal Petites/vitamin D 200-250 Mg-unit Tabs (Calcium citrate-vitamin d) .[name].. 1 by mouth two times a day 14)  Glimepiride 2 Mg Tabs (Glimepiride) .[name].. 1 q am 15)  Zegerid 40-1100 Mg Caps (Omeprazole-sodium bicarbonate) .[name].. 1 each am 16)  Nitroglycerin 0.4 Mg Subl (Nitroglycerin) .... One tablet under tongue every 5 minutes as needed for chest pain---may repeat times three 17)  Fluticasone Propionate 50 Mcg/act Susp (Fluticasone propionate) .[name].. 1 spray two times a day 18)  Align Caps (Probiotic product) .[name].. 1 cap once daily 19)  Proventil Hfa 108 (90 Base) Mcg/act Aers (Albuterol sulfate) .[name].. 1-2 puffs every 4 hrs as needed for cough, sob 20)  Cpap and 2liters O2 At Night  .... At bedtime 21)  Nebulizer Compressor Misc (Nebulizers) .... Use with proventil for nebulizer treatment two times a day 22)  Gabapentin 100 Mg Caps (Gabapentin) .[name].. 1 at bedtime  Patient Instructions: 1)  Adjust pillow under LLE to prevent paresthesias @ L-4 distribution Prescriptions: GABAPENTIN 100 MG CAPS (GABAPENTIN) 1 at bedtime  #30 x 5   Entered and Authorized by:   Unice Cobble MD   Signed by:   Unice Cobble MD on 11/02/2009   Method used:   Faxed to ...       [facility] # [id number]* (retail)       [address]       [place], [place]  [address]       Ph: [phone] or [phone]       Fax: [phone]    RxID:   [id number]

## 2010-08-02 NOTE — Letter (Signed)
Summary: CMN for Oxygen/[facility]  CMN for Oxygen/[facility]   Imported By: Edmonia James 08/30/2009 13:31:39  _____________________________________________________________________  External Attachment:    Type:   Image     Comment:   External Document

## 2010-08-02 NOTE — Progress Notes (Signed)
Summary: [place]  Phone Note Call from Patient Call back at Home Phone [phone]   Caller: Patient Reason for Call: Refill Medication Summary of Call: Patient called to let you know that [facility] ran an o2 sat machine overnight while he slept and it said he was running low and wanted him to sleep with 02 now. They are bringing it by today so he can start tonight. Just and FYI and making sure it was ok.  Initial call taken by: Elna Breslow,  August 25, 2009 4:23 PM  Follow-up for Phone Call        He should use O2 @ night ; a Sleep Study should be scheduled if respiratory symptoms persist or progress Follow-up by: Unice Cobble MD,  August 28, 2009 5:40 PM  Additional Follow-up for Phone Call Additional follow up Details #1::        Left message on machine for patient to return call when avaliable, Reason for call:    Discuss above information Additional Follow-up by: Georgette Dover,  August 29, 2009 10:10 AM    Additional Follow-up for Phone Call Additional follow up Details #2::    Spoke with patient, patient with sleep Apnea machine x3 years, had sleep study 3 years ago. Patient seen a Doctor (ENT) in [place].   Patient aware to call if symptoms contiune. Patient said since starting the oxygen(Last Friday) at night he is sneezing and with watery eyes. Patient not sure if this is related to using the machine or allergies. Dr.Hopper please advise since patient already had sleep study. Follow-up by: Georgette Dover,  August 29, 2009 2:48 PM

## 2010-08-02 NOTE — Assessment & Plan Note (Signed)
Summary: not any better on antibiodics//lch   Vital Signs:  Patient profile:   70 year old male Weight:      279.25 pounds Temp:     98.5 degrees F oral Pulse rate:   84 / minute Resp:     16 per minute BP sitting:   140 / 70  Vitals Entered By: Verdie Mosher (August 15, 2009 3:16 PM) CC: seen in jan, still has cough with production, white and yellow phlegm at night, stuffy nose, using netti pot day and night, finished all abx Comments pt says due for lab? metfomin  rx was told need lab   Primary Care Provider:  Unice Cobble MD  CC:  seen in [place], still has cough with production, white and yellow phlegm at night, stuffy nose, using netti pot day and night, and finished all abx.  History of Present Illness: Nocturnal cough with yellow secretions; "white pea sized  cotton ball" produced intermittently during day. Neti pot two times a day with benefit for nasal congestion. Fluticasone required during night. Rx: Symbicort 2 puffs two times a day ; gargling after use. No albuterol MDI or  HHN. On CPAP, but it is irritating his nose.FBS always < 120; some hypoglycemia mid am.  Allergies: 1)  ! * Benazepril 2)  ! * Hctz  Review of Systems General:  Complains of sweats; denies chills and fever. ENT:  Complains of sinus pressure; No frontal headache, facial pain or purulence. Resp:  Complains of wheezing; denies chest pain with inspiration and shortness of breath; Nocturnal wheezing.  Physical Exam  General:  well-nourished,in no acute distress; alert,appropriate and cooperative throughout examination Ears:  External ear exam shows no significant lesions or deformities.  Otoscopic examination reveals clear canals, tympanic membranes are intact bilaterally without bulging, retraction, inflammation or discharge. Hearing is grossly normal bilaterally. Nose:  External nasal examination shows no deformity or inflammation. Nasal mucosa are pink and moist without lesions or exudates. hyponasal  speech Mouth:  Oral mucosa and oropharynx without lesions or exudates.  Teeth in good repair.Mild pharyngeal erythema.   Lungs:  Normal respiratory effort, chest expands symmetrically. Lungs are clear to auscultation, no crackles or wheezes. Cervical Nodes:  No lymphadenopathy noted Axillary Nodes:  No palpable lymphadenopathy   Impression & Recommendations:  Problem # 1:  BRONCHITIS, OBSTRUCTIVE CHRONIC (ICD-491.20)  Orders: Venipuncture [id number]) TLB-CBC Platelet - w/Differential (85025-CBCD) T-2 View CXR (71020TC) Prescription Created Electronically [phone])  Problem # 2:  DIABETES MELLITUS, TYPE II (ICD-250.00)  His updated medication list for this problem includes:    Aspirin Ec 81 Mg Tbec (Aspirin) .[name]... Take 1 tablet by mouth every morning    Metformin Hcl 500 Mg Tabs (Metformin hcl) .[name]... 1 by mouth two times a day    Glimepiride 2 Mg Tabs (Glimepiride) .[name]... 1 q am  Orders: TLB-Creatinine, Blood (82565-CREA) TLB-Potassium (K+) (84132-K) TLB-BUN (Urea Nitrogen) (84520-BUN) TLB-A1C / Hgb A1C (Glycohemoglobin) (83036-A1C) TLB-Microalbumin/Creat Ratio, Urine (82043-MALB)  Complete Medication List: 1)  Toprol Xl 100 Mg Tb24 (Metoprolol succinate) .[name].. 1 1/2 by mouth once daily 2)  Lipitor 40 Mg Tabs (Atorvastatin calcium) .... Take 1 tablet by mouth once a day 3)  Aspirin Ec 81 Mg Tbec (Aspirin) .... Take 1 tablet by mouth every morning 4)  Zetia 10 Mg Tabs (Ezetimibe) .... Take 1 tablet by mouth once a day 5)  Allopurinol 300 Mg Tabs (Allopurinol) .... Take 1 tablet by mouth once a day 6)  Metformin Hcl 500 Mg Tabs (Metformin hcl) .Marland KitchenMarland KitchenMarland Kitchen  1 by mouth two times a day 7)  Onetouch Ultrasoft Lancets Misc (Lancets) 8)  Freestyle Lite Test Strp (Glucose blood) .... Test once daily 9)  Amlodipine Besylate 5 Mg Tabs (Amlodipine besylate) .[name].. 1 by mouth daily 10)  Furosemide 40 Mg Tabs (Furosemide) .... 2 by mouth twice daily, if swelling down ok to take 2am, 1pm 11)  Klor-con M20 20  Meq Tbcr (Potassium chloride crys cr) .... 2 by mouth twice daily, if fluid pill is working 2am, 1pm 12)  Vitamin C 500 Mg Tabs (Ascorbic acid) .[name].. 1 by mouth once daily 13)  Citracal Petites/vitamin D 200-250 Mg-unit Tabs (Calcium citrate-vitamin d) .[name].. 1 by mouth two times a day 14)  Glimepiride 2 Mg Tabs (Glimepiride) .[name].. 1 q am 15)  Zegerid 40-1100 Mg Caps (Omeprazole-sodium bicarbonate) .[name].. 1 each am 16)  Nitroglycerin 0.4 Mg Subl (Nitroglycerin) .... One tablet under tongue every 5 minutes as needed for chest pain---may repeat times three 17)  Azithromycin 250 Mg Tabs (Azithromycin) .... As per pack 18)  Fluticasone Propionate 50 Mcg/act Susp (Fluticasone propionate) .[name].. 1 spray two times a day 19)  Doxycycline Hyclate 100 Mg Caps (Doxycycline hyclate) .[name].. 1 two times a day x 5 days ,then once daily 20)  Avelox 400 Mg Tabs (Moxifloxacin hcl) .[name].. 1 once daily 21)  Symbicort 160-4.5 Mcg/act Aero (Budesonide-formoterol fumarate) .[name].. 1-2 puffs every 12 hrs ; gargle & spit after use in place of advair 22)  Align  .[name].. 1 by mouth qd 23)  Proventil Hfa 108 (90 Base) Mcg/act Aers (Albuterol sulfate) .[name].. 1-2 puffs every 4 hrs as needed for cough, sob  Patient Instructions: 1)  Albuterol MDI OR Nebulizer every 4 hrs as needed for cough , SOB as discussed. Major factor is uncontrolled asthmatic component, not infection. Prescriptions: PROVENTIL HFA 108 (90 BASE) MCG/ACT AERS (ALBUTEROL SULFATE) 1-2 puffs every 4 hrs as needed for cough, SOB  #1 x 2   Entered and Authorized by:   Unice Cobble MD   Signed by:   Unice Cobble MD on 08/15/2009   Method used:   Faxed to ...       [facility] # [id number]* (retail)       [address]       [place], [place]  [address]       Ph: [phone] or [phone]       Fax: [phone]   RxID:   [id number] SYMBICORT 160-4.5 MCG/ACT AERO (BUDESONIDE-FORMOTEROL FUMARATE) 1-2 puffs every 12 hrs ; gargle & spit after  use in place of Advair  #1 x 11   Entered and Authorized by:   Unice Cobble MD   Signed by:   Unice Cobble MD on 08/15/2009   Method used:   Faxed to ...       [facility] # [id number]* (retail)       [address]       [place],   [address]       Ph: [phone] or [phone]       Fax: [phone]   RxID:   [id number]

## 2010-08-02 NOTE — Progress Notes (Signed)
Summary: Colcrys and cholest med  Phone Note Call from Patient Call back at Work Phone [phone]   Summary of Call: Patient left message on triage that he is takign Colcrys and is now free of gout. He is aware to hold his cholesterol med while taking this prescription. He would like to know, now should he stop the Colcrys and resume cholesterol med? Please advise. Initial call taken by: [name] CMA,  May 28, 2010 2:15 PM  Follow-up for Phone Call        Per MD the patient should stop Colcrys and re-start his cholesterol med.  Patient notified. Follow-up by: [name] CMA,  May 28, 2010 3:28 PM

## 2010-08-02 NOTE — Miscellaneous (Signed)
Summary: Re Eval Needed for Oxygen/[facility]  Re Eval Needed for Oxygen/[facility]   Imported By: Edmonia James 05/29/2010 10:53:53  _____________________________________________________________________  External Attachment:    Type:   Image     Comment:   External Document

## 2010-08-02 NOTE — Assessment & Plan Note (Signed)
Summary: feels worse and was just put on z -pack 2 weeks ago- jr   Vital Signs:  Patient profile:   70 year old male Weight:      273 pounds Temp:     98.2 degrees F oral Pulse rate:   84 / minute Resp:     17 per minute BP sitting:   132 / 80  (left arm) Cuff size:   large  Vitals Entered By: Georgette Dover (July 04, 2009 3:08 PM) CC: No better after 5day ABX: productive cough (discolored, ? if sinuses swollen-nose stuffy.  Comments REVIEWED MED LIST, PATIENT AGREED DOSE AND INSTRUCTION CORRECT    Primary Care Provider:  Unice Cobble MD  CC:  No better after 5day ABX: productive cough (discolored and ? if sinuses swollen-nose stuffy. .  History of Present Illness: Despite  Zpack & Neti pot two times a day head congestion persists along with cough productive of white gray phlegm mainly @ night. Night sweats w/o chills or fever. S/P sinus surgery 1 year ago.  Allergies: 1)  ! * Benazepril 2)  ! * Hctz  Review of Systems ENT:  Complains of nasal congestion and sinus pressure; No purulence, facial pain or frontal headache. Resp:  Denies chest pain with inspiration, pleuritic, shortness of breath, and wheezing.  Physical Exam  General:  well-nourished,in no acute distress; alert,appropriate and cooperative throughout examination Ears:  External ear exam shows no significant lesions or deformities.  Otoscopic examination reveals clear canals, tympanic membranes are intact bilaterally without bulging, retraction, inflammation or discharge. Hearing is grossly normal bilaterally. Nose:  External nasal examination shows no deformity or inflammation. Nasal mucosa are dry without lesions or exudates. Mouth:  Oral mucosa and oropharynx without lesions or exudates.  Marked pharyngeal erythema.   Lungs:  Normal respiratory effort, chest expands symmetrically. Lungs are clear to auscultation, no crackles or wheezes. Cervical Nodes:  No lymphadenopathy noted Axillary Nodes:  No palpable  lymphadenopathy   Impression & Recommendations:  Problem # 1:  BRONCHITIS, ACUTE (ICD-466.0)  His updated medication list for this problem includes:    Azithromycin 250 Mg Tabs (Azithromycin) .[name]... As per pack    Doxycycline Hyclate 100 Mg Caps (Doxycycline hyclate) .[name]... 1 two times a day x 5 days ,then once daily  Problem # 2:  URI (ICD-465.9)  His updated medication list for this problem includes:    Aspirin Ec 81 Mg Tbec (Aspirin) .[name]... Take 1 tablet by mouth every morning  Complete Medication List: 1)  Toprol Xl 100 Mg Tb24 (Metoprolol succinate) .[name].. 1 1/2 by mouth once daily 2)  Lipitor 40 Mg Tabs (Atorvastatin calcium) .... Take 1 tablet by mouth once a day 3)  Aspirin Ec 81 Mg Tbec (Aspirin) .... Take 1 tablet by mouth every morning 4)  Zetia 10 Mg Tabs (Ezetimibe) .... Take 1 tablet by mouth once a day 5)  Allopurinol 300 Mg Tabs (Allopurinol) .... Take 1 tablet by mouth once a day 6)  Metformin Hcl 500 Mg Tabs (Metformin hcl) .[name].. 1 by mouth two times a day 7)  Onetouch Ultrasoft Lancets Misc (Lancets) 8)  Onetouch Ultra Test Strp (Glucose blood) 9)  Amlodipine Besylate 5 Mg Tabs (Amlodipine besylate) .[name].. 1 by mouth daily 10)  Furosemide 40 Mg Tabs (Furosemide) .... 2 by mouth twice daily, if swelling down ok to take 2am, 1pm 11)  Klor-con M20 20 Meq Tbcr (Potassium chloride crys cr) .... 2 by mouth twice daily, if fluid pill is working  2am, 1pm 12)  Vitamin C 500 Mg Tabs (Ascorbic acid) .[name].. 1 by mouth once daily 13)  Citracal Petites/vitamin D 200-250 Mg-unit Tabs (Calcium citrate-vitamin d) .[name].. 1 by mouth two times a day 14)  Glimepiride 2 Mg Tabs (Glimepiride) .[name].. 1 q am 15)  Zegerid 40-1100 Mg Caps (Omeprazole-sodium bicarbonate) .[name].. 1 each am 16)  Nitroglycerin 0.4 Mg Subl (Nitroglycerin) .... One tablet under tongue every 5 minutes as needed for chest pain---may repeat times three 17)  Azithromycin 250 Mg Tabs (Azithromycin) .... As per pack 18)  Fluticasone  Propionate 50 Mcg/act Susp (Fluticasone propionate) .[name].. 1 spray two times a day 19)  Doxycycline Hyclate 100 Mg Caps (Doxycycline hyclate) .[name].. 1 two times a day x 5 days ,then once daily  Patient Instructions: 1)  Drink as much fluid as you can tolerate for the next few days. Prescriptions: DOXYCYCLINE HYCLATE 100 MG CAPS (DOXYCYCLINE HYCLATE) 1 two times a day X 5 days ,then once daily  #15 x 0   Entered and Authorized by:   Unice Cobble MD   Signed by:   Unice Cobble MD on 07/04/2009   Method used:   Faxed to ...       [facility] # [id number]* (retail)       [address]       [place], [place]  [address]       Ph: [phone] or [phone]       Fax: [phone]   RxID:   [id number] FLUTICASONE PROPIONATE 50 MCG/ACT SUSP (FLUTICASONE PROPIONATE) 1 spray two times a day  #1 x 11   Entered and Authorized by:   Unice Cobble MD   Signed by:   Unice Cobble MD on 07/04/2009   Method used:   Faxed to ...       [facility] # [id number]* (retail)       [address]       [place], [place]  [address]       Ph: [phone] or [phone]       Fax: [phone]   RxID:   [id number]

## 2010-08-02 NOTE — Assessment & Plan Note (Signed)
Summary: HEAD & CHEST CONGESTION/RH.......Richard [name]   Vital Signs:  Patient profile:   70 year old male Weight:      273 pounds Temp:     98.3 degrees F oral Pulse rate:   88 / minute Resp:     17 per minute BP sitting:   150 / 90  (left arm) Cuff size:   large  Vitals Entered By: Georgette Dover (July 25, 2009 10:52 AM) CC: Ongoing head and chest congestion x 5 weeks, patient seen x3 for this concern. Patient would also like to discuss a difference in BM in the am-patient is going at least 3 x in the am Comments REVIEWED MED LIST, PATIENT AGREED DOSE AND INSTRUCTION CORRECT    Primary Care Provider:  Unice Cobble MD  CC:  Ongoing head and chest congestion x 5 weeks and patient seen x3 for this concern. Patient would also like to discuss a difference in BM in the am-patient is going at least 3 x in the am.  History of Present Illness: Head congestion controlled by Neti pot; but now coughing dark yellow sputum for 2 days despite Advair & Mucinex DM. FBS 98-107. Frequent loose  BMs post meals  after normal BM in am.  Allergies: 1)  ! * Benazepril 2)  ! * Hctz  Review of Systems General:  Complains of sweats; denies chills and fever; Occa night sweats. ENT:  Complains of nasal congestion and sinus pressure; No frontal headache , facial pain or purulence. Resp:  Complains of cough, sputum productive, and wheezing; denies shortness of breath. GI:  Complains of change in bowel habits; denies abdominal pain, bloody stools, dark tarry stools, and diarrhea.  Physical Exam  General:  well-nourished,in no acute distress; alert,appropriate and cooperative throughout examination Nose:  External nasal examination shows no deformity or inflammation. Nasal mucosa are pink and moist without lesions or exudates. Mouth:  Oral mucosa and oropharynx without lesions or exudates.  Marked pharyngeal erythema.   Lungs:  Normal respiratory effort, chest expands symmetrically. Lungs are clear to auscultation,  no crackles or wheezes. Surprizing lack of findings Heart:  Normal rate and regular rhythm. S1 and S2 normal without gallop, murmur, click, rub. S4 Abdomen:  Bowel sounds positive,abdomen soft and non-tender without masses, organomegaly or hernias noted. Protuberant Skin:  Intact without suspicious lesions or rashes Cervical Nodes:  No lymphadenopathy noted Axillary Nodes:  No palpable lymphadenopathy   Impression & Recommendations:  Problem # 1:  BRONCHITIS, ACUTE (ICD-466.0)  His updated medication list for this problem includes:    Azithromycin 250 Mg Tabs (Azithromycin) .Richard [name]... As per pack    Doxycycline Hyclate 100 Mg Caps (Doxycycline hyclate) .Richard [name]... 1 two times a day x 5 days ,then once daily    Avelox 400 Mg Tabs (Moxifloxacin hcl) .Richard [name]... 1 once daily    Symbicort 160-4.5 Mcg/act Aero (Budesonide-formoterol fumarate) .Richard [name]... 1-2 puffs every 12 hrs ; gargle & spit after use in place of advair  Complete Medication List: 1)  Toprol Xl 100 Mg Tb24 (Metoprolol succinate) .Richard [name].. 1 1/2 by mouth once daily 2)  Lipitor 40 Mg Tabs (Atorvastatin calcium) .... Take 1 tablet by mouth once a day 3)  Aspirin Ec 81 Mg Tbec (Aspirin) .... Take 1 tablet by mouth every morning 4)  Zetia 10 Mg Tabs (Ezetimibe) .... Take 1 tablet by mouth once a day 5)  Allopurinol 300 Mg Tabs (Allopurinol) .... Take 1 tablet by mouth once a day 6)  Metformin Hcl 500 Mg Tabs (Metformin  hcl) .... 1 by mouth two times a day 7)  Onetouch Ultrasoft Lancets Misc (Lancets) 8)  Freestyle Lite Test Strp (Glucose blood) .... Test once daily 9)  Amlodipine Besylate 5 Mg Tabs (Amlodipine besylate) .Richard [name].. 1 by mouth daily 10)  Furosemide 40 Mg Tabs (Furosemide) .... 2 by mouth twice daily, if swelling down ok to take 2am, 1pm 11)  Klor-con M20 20 Meq Tbcr (Potassium chloride crys cr) .... 2 by mouth twice daily, if fluid pill is working 2am, 1pm 12)  Vitamin C 500 Mg Tabs (Ascorbic acid) .Richard [name].. 1 by mouth once daily 13)  Citracal  Petites/vitamin D 200-250 Mg-unit Tabs (Calcium citrate-vitamin d) .Richard [name].. 1 by mouth two times a day 14)  Glimepiride 2 Mg Tabs (Glimepiride) .Richard [name].. 1 q am 15)  Zegerid 40-1100 Mg Caps (Omeprazole-sodium bicarbonate) .Richard [name].. 1 each am 16)  Nitroglycerin 0.4 Mg Subl (Nitroglycerin) .... One tablet under tongue every 5 minutes as needed for chest pain---may repeat times three 17)  Azithromycin 250 Mg Tabs (Azithromycin) .... As per pack 18)  Fluticasone Propionate 50 Mcg/act Susp (Fluticasone propionate) .Richard [name].. 1 spray two times a day 19)  Doxycycline Hyclate 100 Mg Caps (Doxycycline hyclate) .Richard [name].. 1 two times a day x 5 days ,then once daily 20)  Avelox 400 Mg Tabs (Moxifloxacin hcl) .Richard [name].. 1 once daily 21)  Symbicort 160-4.5 Mcg/act Aero (Budesonide-formoterol fumarate) .Richard [name].. 1-2 puffs every 12 hrs ; gargle & spit after use in place of advair  Patient Instructions: 1)  Symbicort inplace of Advair. Take 2 samples of Avelox before fillig Rx. Align once daily until bowels are normal. 2)  Drink as much fluid as you can tolerate for the next few days. 3)  Avoid foods high in acid (tomatoes, citrus juices, spicy foods). Avoid eating within two hours of lying down or before exercising. Do not over eat; try smaller more frequent meals. Elevate head of bed twelve inches when sleeping. Prescriptions: SYMBICORT 160-4.5 MCG/ACT AERO (BUDESONIDE-FORMOTEROL FUMARATE) 1-2 puffs every 12 hrs ; gargle & spit after use in place of Advair  #1 x 0   Entered and Authorized by:   Unice Cobble MD   Signed by:   Unice Cobble MD on 07/25/2009   Method used:   Samples Given   RxID:   [id number] AVELOX 400 MG TABS (MOXIFLOXACIN HCL) 1 once daily  #5 x 0   Entered and Authorized by:   Unice Cobble MD   Signed by:   Unice Cobble MD on 07/25/2009   Method used:   Print then Give to Patient   RxID:   [id number]

## 2010-08-02 NOTE — Assessment & Plan Note (Signed)
Summary: 3 month/dmp  Medications Added METFORMIN HCL 500 MG TABS (METFORMIN HCL) 1 tab two times a day FUROSEMIDE 40 MG  TABS (FUROSEMIDE) 2 tab qam...1 tab qpm KLOR-CON M20 20 MEQ  TBCR (POTASSIUM CHLORIDE CRYS CR) 2 tabs qam..1 tab qpm ALIGN  CAPS (PROBIOTIC PRODUCT) 1 cap once daily * CPAP at bedtime NEBULIZER COMPRESSOR  MISC (NEBULIZERS) use with proventil for nebulizer treatment two times a day        Visit Type:  3 mo f/u Primary Provider:  Unice Cobble MD  CC:  pt states he has been dx with chronic bronchitis ...no cardiac complaints today.  History of Present Illness: [name] returns today for evaluation and management of his coronary artery disease. He's had bronchitis since the latter part of December. He's been on 3 different courses of antibiotics with Dr. Linna Darner. His last chest x-ray February 16 showed hyperinflation with central bronchitic markings but no pneumonia or infiltrate.  He's had no angina or ischemic symptoms. Still active playing. His blood pressure is under better control his weight has been stable. His last hemoglobin A1c was 6.9%. His edema has also been under good control.  His last stress test was in October 2009.  Looking back at his labs, his triglycerides were over 600 back in the fall.    Current Medications (verified): 1)  Toprol Xl 100 Mg Tb24 (Metoprolol Succinate) .[name].. 1 1/2 By Mouth Once Daily 2)  Lipitor 40 Mg Tabs (Atorvastatin Calcium) .... Take 1 Tablet By Mouth Once A Day 3)  Aspirin Ec 81 Mg Tbec (Aspirin) .... Take 1 Tablet By Mouth Every Morning 4)  Zetia 10 Mg Tabs (Ezetimibe) .... Take 1 Tablet By Mouth Once A Day 5)  Allopurinol 300 Mg Tabs (Allopurinol) .... Take 1 Tablet By Mouth Once A Day 6)  Metformin Hcl 500 Mg Tabs (Metformin Hcl) .[name].. 1 Tab Two Times A Day 7)  Onetouch Ultrasoft Lancets  Misc (Lancets) 8)  Freestyle Lite Test  Strp (Glucose Blood) .... Test Once Daily 9)  Amlodipine Besylate 5 Mg Tabs (Amlodipine  Besylate) .[name].. 1 By Mouth Daily 10)  Furosemide 40 Mg  Tabs (Furosemide) .... 2 Tab Qam...1 Tab Qpm 11)  Klor-Con M20 20 Meq  Tbcr (Potassium Chloride Crys Cr) .... 2 Tabs Qam..1 Tab Qpm 12)  Vitamin C 500 Mg Tabs (Ascorbic Acid) .[name].. 1 By Mouth Once Daily 13)  Citracal Petites/vitamin D 200-250 Mg-Unit Tabs (Calcium Citrate-Vitamin D) .[name].. 1 By Mouth Two Times A Day 14)  Glimepiride 2 Mg Tabs (Glimepiride) .[name].. 1 Q Am 15)  Zegerid 40-1100 Mg Caps (Omeprazole-Sodium Bicarbonate) .[name].. 1 Each Am 16)  Nitroglycerin 0.4 Mg Subl (Nitroglycerin) .... One Tablet Under Tongue Every 5 Minutes As Needed For Chest Pain---May Repeat Times Three 17)  Fluticasone Propionate 50 Mcg/act Susp (Fluticasone Propionate) .[name].. 1 Spray Two Times A Day 18)  Align  Caps (Probiotic Product) .[name].. 1 Cap Once Daily 19)  Proventil Hfa 108 (90 Base) Mcg/act Aers (Albuterol Sulfate) .[name].. 1-2 Puffs Every 4 Hrs As Needed For Cough, Sob 20)  Cpap .... At Bedtime 21)  Nebulizer Compressor  Misc (Nebulizers) .... Use With Proventil For Nebulizer Treatment Two Times A Day  Allergies: 1)  ! * Benazepril 2)  ! * Hctz  Past History:  Past Medical History: Last updated: 11/26/2008 Gout Esophageal structure Coronary artery disease Mixed hyperlipidemia Obesity  Past Surgical History: Last updated: 05/29/2007 Angioplasty 1993 Cath and stent placed 05/1997 Vastectomy 5 bypass surgery 2001  Family History: Last updated: 05/29/2007 Father:  Mother: Pancreatic cancer Siblings: MGM-DM  Social History: Last updated: 11/26/2008 Full Time-[employer]  Single-previously married Tobacco Use - No.  Alcohol Use - no  Risk Factors: Alcohol Use: <1 (05/29/2007)  Risk Factors: Smoking Status: never (11/26/2008)  Review of Systems       negative other than history of present illness  Vital Signs:  Patient profile:   70 year old male Height:      70 inches Weight:      280 pounds BMI:     40.32 Pulse rate:    88 / minute Pulse rhythm:   regular BP sitting:   138 / 80  (left arm) Cuff size:   large  Vitals Entered By: Julaine Hua, CMA (August 22, 2009 4:46 PM)  Physical Exam  General:  overweight, muscular, in no acute distress Head:  normocephalic and atraumatic Eyes:  PERRLA/EOM intact; conjunctiva and lids normal. Neck:  Neck supple, no JVD. No masses, thyromegaly or abnormal cervical nodes. Chest Franklin Baumbach:  no deformities or breast masses noted Lungs:  clear without rhonchi or wheezes Heart:  normal PMI, normal S1-S2, no murmur rub or gallop Msk:  decreased range of motion of the left knee Pulses:  pulses normal in all 4 extremities Extremities:  1+ left pedal edema and 1+ right pedal edema.   Neurologic:  Alert and oriented x 3. Skin:  Intact without lesions or rashes. Psych:  Normal affect.   Problems:  Medical Problems Added: 1)  Dx of Cad, Artery Bypass Graft  (ICD-414.04)  Impression & Recommendations:  Problem # 1:  CAD, ARTERY BYPASS GRAFT (ICD-414.04) Assessment Unchanged  His updated medication list for this problem includes:    Toprol Xl 100 Mg Tb24 (Metoprolol succinate) .[name]... 1 1/2 by mouth once daily    Aspirin Ec 81 Mg Tbec (Aspirin) .[name]... Take 1 tablet by mouth every morning    Amlodipine Besylate 5 Mg Tabs (Amlodipine besylate) .[name]... 1 by mouth daily    Nitroglycerin 0.4 Mg Subl (Nitroglycerin) ..... One tablet under tongue every 5 minutes as needed for chest pain---may repeat times three  Problem # 2:  HYPERTENSION, BENIGN (ICD-401.1) Assessment: Improved  His updated medication list for this problem includes:    Toprol Xl 100 Mg Tb24 (Metoprolol succinate) .[name]... 1 1/2 by mouth once daily    Aspirin Ec 81 Mg Tbec (Aspirin) .[name]... Take 1 tablet by mouth every morning    Amlodipine Besylate 5 Mg Tabs (Amlodipine besylate) .[name]... 1 by mouth daily    Furosemide 40 Mg Tabs (Furosemide) .[name]... 2 tab qam...1 tab qpm  Problem # 3:  EDEMA (ICD-782.3) Assessment:  Improved  Problem # 4:  HYPERLIPIDEMIA, MIXED (ICD-272.2) Assessment: Deteriorated weight reduction, carbohydrate restriction, continue medications. His updated medication list for this problem includes:    Lipitor 40 Mg Tabs (Atorvastatin calcium) .[name]... Take 1 tablet by mouth once a day    Zetia 10 Mg Tabs (Ezetimibe) .[name]... Take 1 tablet by mouth once a day  Patient Instructions: 1)  Your physician recommends that you schedule a follow-up appointment in: Junior DUE AUGUST 2011 2)  Your physician recommends that you continue on your current medications as directed. Please refer to the Current Medication list given to you today.

## 2010-08-02 NOTE — Assessment & Plan Note (Signed)
Summary: congested/cough/cbs   Vital Signs:  Patient profile:   70 year old male Weight:      264.2 pounds BMI:     34.74 Temp:     98.4 degrees F oral Pulse rate:   72 / minute Resp:     17 per minute BP sitting:   122 / 70  (left arm) Cuff size:   large  Vitals Entered By: Georgette Dover CMA (May 01, 2010 2:28 PM) CC: Cough and congestion (yellow) Comments FYI- 3 injectiong given in left knee    Primary Care Titiana Severa:  Unice Cobble MD  CC:  Cough and congestion (yellow).  History of Present Illness: Cough      This is a 70 year old man who presents with Cough X 2 days.  The patient reports productive cough with yellow white sputum , but denies pleuritic chest pain ( but  upper chest pain with cough), shortness of breath, wheezing, fever, and hemoptysis.  The patient denies the following symptoms: cold/URI symptoms, sore throat, and nasal congestion.  Ineffective prior treatments have included OTC cough medication.  Rx: Coricidin BP; Neti pot  Current Medications (verified): 1)  Toprol Xl 100 Mg Tb24 (Metoprolol Succinate) .[name].. 1 1/2 By Mouth Once Daily 2)  Lipitor 40 Mg Tabs (Atorvastatin Calcium) .... Take 1 Tablet By Mouth Once A Day 3)  Aspirin Ec 81 Mg Tbec (Aspirin) .... Take 1 Tablet By Mouth Every Morning 4)  Zetia 10 Mg Tabs (Ezetimibe) .... Take 1 Tablet By Mouth Once A Day 5)  Allopurinol 300 Mg Tabs (Allopurinol) .... Take 1 Tablet By Mouth Once A Day 6)  Metformin Hcl 1000 Mg Tabs (Metformin Hcl) .[name].. 1 By Mouth Two Times A Day 7)  Freestyle Lancets  Misc (Lancets) .... Check Blood Sugar Daily 8)  Freestyle Lite Test  Strp (Glucose Blood) .... Test Once Daily 9)  Amlodipine Besylate 5 Mg Tabs (Amlodipine Besylate) .[name].. 1 By Mouth Daily 10)  Furosemide 40 Mg  Tabs (Furosemide) .... 2 Tab Qam...1 Tab Qpm 11)  Klor-Con M20 20 Meq  Tbcr (Potassium Chloride Crys Cr) .... 2 Tabs Qam..1 Tab Qpm 12)  Vitamin C 500 Mg Tabs (Ascorbic Acid) .[name].. 1 By Mouth Once  Daily 13)  Citracal Petites/vitamin D 200-250 Mg-Unit Tabs (Calcium Citrate-Vitamin D) .[name].. 1 By Mouth Two Times A Day 14)  Glimepiride 2 Mg Tabs (Glimepiride) .[name].. 1 Q Am 15)  Zegerid 40-1100 Mg Caps (Omeprazole-Sodium Bicarbonate) .[name].. 1 Each Am 16)  Nitroglycerin 0.4 Mg Subl (Nitroglycerin) .... One Tablet Under Tongue Every 5 Minutes As Needed For Chest Pain---May Repeat Times Three 17)  Fluticasone Propionate 50 Mcg/act Susp (Fluticasone Propionate) .[name].. 1 Spray Two Times A Day 18)  Align  Caps (Probiotic Product) .[name].. 1 Cap Once Daily As Needed 19)  Cpap and 2liters O2 At Night .... At Bedtime 20)  Nebulizer Compressor  Misc (Nebulizers) .... Use With Proventil For Nebulizer Treatment Once Daily 21)  Gabapentin 100 Mg Caps (Gabapentin) .[name].. 1 At Bedtime As Needed  Allergies: 1)  ! * Benazepril 2)  ! * Hctz  Physical Exam  General:  in no acute distress; alert,appropriate and cooperative throughout examination Mouth:  Oral mucosa and oropharynx without lesions or exudates.  Mild pharyngeal erythema & edema.   Lungs:  Normal respiratory effort, chest expands symmetrically. Lungs are clear to auscultation, no crackles or wheezes. Heart:  normal rate, regular rhythm, no gallop, no rub, no JVD, and grade 1 /6 systolic murmur.  S4 Cervical Nodes:  No lymphadenopathy noted Axillary Nodes:  No palpable lymphadenopathy   Impression & Recommendations:  Problem # 1:  BRONCHITIS-ACUTE (ICD-466.0)  The following medications were removed from the medication list:    Proventil Hfa 108 (90 Base) Mcg/act Aers (Albuterol sulfate) .[name]... 1-2 puffs every 4 hrs as needed for cough, sob His updated medication list for this problem includes:    Azithromycin 250 Mg Tabs (Azithromycin) .[name]... As per pack    Hydromet 5-1.5 Mg/51ml Syrp (Hydrocodone-homatropine) .[name]... 1 tsp every 6 hrs as needed  Complete Medication List: 1)  Toprol Xl 100 Mg Tb24 (Metoprolol succinate) .[name].. 1 1/2 by mouth once  daily 2)  Lipitor 40 Mg Tabs (Atorvastatin calcium) .... Take 1 tablet by mouth once a day 3)  Aspirin Ec 81 Mg Tbec (Aspirin) .... Take 1 tablet by mouth every morning 4)  Zetia 10 Mg Tabs (Ezetimibe) .... Take 1 tablet by mouth once a day 5)  Allopurinol 300 Mg Tabs (Allopurinol) .... Take 1 tablet by mouth once a day 6)  Metformin Hcl 1000 Mg Tabs (Metformin hcl) .[name].. 1 by mouth two times a day 7)  Freestyle Lancets Misc (Lancets) .... Check blood sugar daily 8)  Freestyle Lite Test Strp (Glucose blood) .... Test once daily 9)  Amlodipine Besylate 5 Mg Tabs (Amlodipine besylate) .[name].. 1 by mouth daily 10)  Furosemide 40 Mg Tabs (Furosemide) .... 2 tab qam...1 tab qpm 11)  Klor-con M20 20 Meq Tbcr (Potassium chloride crys cr) .... 2 tabs qam..1 tab qpm 12)  Vitamin C 500 Mg Tabs (Ascorbic acid) .[name].. 1 by mouth once daily 13)  Citracal Petites/vitamin D 200-250 Mg-unit Tabs (Calcium citrate-vitamin d) .[name].. 1 by mouth two times a day 14)  Glimepiride 2 Mg Tabs (Glimepiride) .[name].. 1 q am 15)  Zegerid 40-1100 Mg Caps (Omeprazole-sodium bicarbonate) .[name].. 1 each am 16)  Nitroglycerin 0.4 Mg Subl (Nitroglycerin) .... One tablet under tongue every 5 minutes as needed for chest pain---may repeat times three 17)  Fluticasone Propionate 50 Mcg/act Susp (Fluticasone propionate) .[name].. 1 spray two times a day 18)  Align Caps (Probiotic product) .[name].. 1 cap once daily as needed 19)  Cpap and 2liters O2 At Night  .... At bedtime 20)  Nebulizer Compressor Misc (Nebulizers) .... Use with proventil for nebulizer treatment once daily 21)  Gabapentin 100 Mg Caps (Gabapentin) .[name].. 1 at bedtime as needed 22)  Azithromycin 250 Mg Tabs (Azithromycin) .... As per pack 23)  Hydromet 5-1.5 Mg/65ml Syrp (Hydrocodone-homatropine) .[name].. 1 tsp every 6 hrs as needed  Patient Instructions: 1)  Drink as much  NON dairy fluid as you can tolerate for the next few days. Prescriptions: HYDROMET 5-1.5 MG/5ML SYRP  (HYDROCODONE-HOMATROPINE) 1 tsp every 6 hrs as needed  #120cc x 0   Entered and Authorized by:   Unice Cobble MD   Signed by:   Unice Cobble MD on 05/01/2010   Method used:   Printed then faxed to ...       [facility] # [id number]* (retail)       [address]       [place], [place]  [address]       Ph: [phone] or [phone]       Fax: [phone]   RxID:   [id number] AZITHROMYCIN 250 MG TABS (AZITHROMYCIN) as per pack  #1 x 0   Entered and Authorized by:   Unice Cobble MD  Signed by:   Unice Cobble MD on 05/01/2010   Method used:   Faxed to ...       [facility] # [id number]* (retail)       [address]       [place], [place]  [address]       Ph: [phone] or [phone]       Fax: [phone]   RxID:   [id number]    Orders Added: 1)  Est. Patient Level III [id number]

## 2010-08-02 NOTE — Letter (Signed)
Summary: CMN for Nebulizer/[facility]  CMN for Nebulizer/[facility]   Imported By: Edmonia James 09/07/2009 10:05:51  _____________________________________________________________________  External Attachment:    Type:   Image     Comment:   External Document

## 2010-08-02 NOTE — Progress Notes (Signed)
Summary: Refill Request  Phone Note Refill Request Message from:  Pharmacy on [facility] on [address] Fax #: [phone]  Refills Requested: Medication #1:  METFORMIN HCL 500 MG TABS 1 tab two times a day   Dosage confirmed as above?Dosage Confirmed   Supply Requested: 1 month   Last Refilled: 08/28/2009 Per patient, he is taking 2 500mg  tabs twice a daily. Perhanps he could change to 1 1000mg  tabtwice daily and adjust qty to match??  Next Appointment Scheduled: none Initial call taken by: Elna Breslow,  September 14, 2009 2:05 PM    New/Updated Medications: METFORMIN HCL 1000 MG TABS (METFORMIN HCL) 1 by mouth two times a day Prescriptions: METFORMIN HCL 1000 MG TABS (METFORMIN HCL) 1 by mouth two times a day  #60 x 5   Entered by:   Georgette Dover   Authorized by:   Unice Cobble MD   Signed by:   Georgette Dover on 09/14/2009   Method used:   Electronically to        [facility] (retail)       [address]       [place], [place]  [address]       Ph: [phone]       Fax: [phone]   RxID:   [id number]

## 2010-08-02 NOTE — Procedures (Signed)
Summary: [facility]  [facility]   Imported By: Edmonia James 08/30/2009 13:32:33  _____________________________________________________________________  External Attachment:    Type:   Image     Comment:   External Document

## 2010-08-02 NOTE — Letter (Signed)
Summary: CMN & Treatment for Albuterol/[facility]  CMN & Treatment for Albuterol/[facility]   Imported By: Edmonia James 08/19/2009 08:49:29  _____________________________________________________________________  External Attachment:    Type:   Image     Comment:   External Document

## 2010-08-02 NOTE — Progress Notes (Signed)
Summary: rx refill   Phone Note Refill Request Message from:  Patient on June 01, 2010 9:12 AM  Refills Requested: Medication #1:  ZETIA 10 MG TABS Take 1 tablet by mouth once a day  Medication #2:  TOPROL XL 100 MG TB24 1 1/2 by mouth once daily  Medication #3:  FUROSEMIDE 40 MG  TABS 2 tab qam...1 tab qpm  Medication #4:  KLOR-CON M20 20 MEQ  TBCR 2 tabs qam..1 tab qpm  pt also needs AMLODIPINE BESYLATE 5 MG TABS pt would like this to be fax to [facility]. pt states fax number on file. pt states he needs 90 days supply.   Method Requested: Fax to [place] Initial call taken by: Regan Lemming,  June 01, 2010 9:14 AM    Prescriptions: KLOR-CON M20 20 MEQ  TBCR (POTASSIUM CHLORIDE CRYS CR) 2 tabs qam..1 tab qpm  #270 x 3   Entered by:   Julaine Hua, CMA   Authorized by:   Renella Cunas, MD, [facility]   Signed by:   Julaine Hua, CMA on 06/01/2010   Method used:   Faxed to ...       [facility] (mail-order)             , [place]         Ph: [phone]       Fax: [phone]   RxID:   [id number] FUROSEMIDE 40 MG  TABS (FUROSEMIDE) 2 tab qam...1 tab qpm  #270 x 3   Entered by:   Julaine Hua, CMA   Authorized by:   Renella Cunas, MD, [facility]   Signed by:   Julaine Hua, CMA on 06/01/2010   Method used:   Faxed to ...       [facility] (mail-order)             , [place]         Ph: [phone]       Fax: [phone]   RxID:   [id number] AMLODIPINE BESYLATE 5 MG TABS (AMLODIPINE BESYLATE) 1 by mouth daily  #90 x 3   Entered by:   Julaine Hua, CMA   Authorized by:   Renella Cunas, MD, [facility]   Signed by:   Julaine Hua, CMA on 06/01/2010   Method used:   Faxed to ...       [facility] (mail-order)             , [place]         Ph: [phone]       Fax: [phone]   RxID:   [id number] ZETIA 10 MG TABS (EZETIMIBE) Take 1 tablet by mouth once a day  #90 x 3   Entered by:   Julaine Hua, CMA   Authorized by:   Renella Cunas, MD, [facility]   Signed by:   Julaine Hua, CMA on  06/01/2010   Method used:   Faxed to ...       [facility] (mail-order)             , [place]         Ph: [phone]       Fax: [phone]   RxID:   [id number] TOPROL XL 100 MG TB24 (METOPROLOL SUCCINATE) 1 1/2 by mouth once daily  #135 x 3   Entered by:   Julaine Hua, CMA   Authorized by:   Renella Cunas, MD, [facility]   Signed by:   Julaine Hua, CMA  on 06/01/2010   Method used:   Faxed to ...       [facility] (mail-order)             , [place]         Ph: [phone]       Fax: [phone]   RxID:   [id number]

## 2010-08-02 NOTE — Letter (Signed)
Summary: Appointment - [place], [place]    Phone:   Fax:      July 26, 2009 MRN: [id number]   [address], [place]  [address]   Dear [name],   Due to a change in our office schedule, your appointment on   08-24-2009  at   4:30             must be changed.  It is very important that we reach you to reschedule this appointment. We look forward to participating in your health care needs. Please contact us at the number listed above at your earliest convenience to reschedule this appointment.     Sincerely,     [facility] Scheduling Team

## 2010-08-02 NOTE — Letter (Signed)
Summary: CMN for Oxygen/[facility]  CMN for Oxygen/[facility]   Imported By: Edmonia James 10/25/2009 12:52:32  _____________________________________________________________________  External Attachment:    Type:   Image     Comment:   External Document

## 2010-08-02 NOTE — Progress Notes (Signed)
Summary: O2 overnight orders  Phone Note Call from Patient   Summary of Call: Pt is due for re-evaluation under medicare guideline. Per [facility] Pt needs to have a OV that covers if Pt is using oxygen and documentation that patient is benefiting from the oxygen before  they will approve Patient again. Pt does not need overnight O2  unless Dr hopper would like to have this done. Pt due for recert between [id number] until 08-25-10. Pls advise if overnight O2 still necessary..........Marland KitchenFelecia Deloach CMA  July 19, 2010 4:24 PM   left message to call office to schedule OV for evaluation....Marland KitchenMarland KitchenFelecia Deloach CMA  July 19, 2010 4:23 PM   Follow-up for Phone Call        I spoke with patient and he indicated that all we need to do is Dr.Hopper needs to indicate that patient is benefitting from Oxygen and needs to remain on oxygen, patient states he is sleeping like a baby since using oxygen.   Patient stated that documentation can be sent to [facility] and they will forward to [facility] please advise Follow-up by: Georgette Dover CMA,  July 19, 2010 4:33 PM  Additional Follow-up for Phone Call Additional follow up Details #1::        As per history , Mr. Tajh Pinkhasov. Fugett , has been compliant with nocturnal oxygen for his documemted  nocturnal desaturation with dramatic subjective improvement in duration & quality of sleep. Pascal Lux MD Additional Follow-up by: Unice Cobble MD,  July 19, 2010 6:06 PM    Additional Follow-up for Phone Call Additional follow up Details #2::    Faxed to [facility] at [phone]. Left message on machine notifying patient. [name] CMA  July 20, 2010 10:55 AM

## 2010-08-02 NOTE — Assessment & Plan Note (Signed)
Summary: VOMIT AND ACHING AND FEVER//PH   Vital Signs:  Patient profile:   70 year old male Weight:      264.6 pounds Temp:     98.3 degrees F oral Pulse rate:   100 / minute Pulse rhythm:   regular BP sitting:   122 / 80  (right arm) Cuff size:   large  Vitals Entered By: Aron Baba CMA Deborra Medina) (May 29, 2010 11:07 AM) CC: x1 c/o severe abdominal pain, NVD and a fever after eating lunch   History of Present Illness: Pt c/o NVD since yesterday after eating vegetables at restaurant yesterday. Pt also had fever.  No one else got sick and they all ate the same thing.  Fever was 101 at its highest.    Pt was able to sip on sprite zero today with no vomiting  since 6am and last episode diarrhea 9am .   Pt has taken nothing except tylenol 2x.    Pt took last tylenol about 5am.     Current Medications (verified): 1)  Toprol Xl 100 Mg Tb24 (Metoprolol Succinate) .[name].. 1 1/2 By Mouth Once Daily 2)  Lipitor 40 Mg Tabs (Atorvastatin Calcium) .... Take 1 Tablet By Mouth Once A Day 3)  Aspirin Ec 81 Mg Tbec (Aspirin) .... Take 1 Tablet By Mouth Every Morning 4)  Zetia 10 Mg Tabs (Ezetimibe) .... Take 1 Tablet By Mouth Once A Day 5)  Allopurinol 300 Mg Tabs (Allopurinol) .... Take 1 Tablet By Mouth Once A Day 6)  Metformin Hcl 1000 Mg Tabs (Metformin Hcl) .[name].. 1 By Mouth Two Times A Day 7)  Freestyle Lancets  Misc (Lancets) .... Check Blood Sugar Daily 8)  Freestyle Lite Test  Strp (Glucose Blood) .... Test Once Daily 9)  Amlodipine Besylate 5 Mg Tabs (Amlodipine Besylate) .[name].. 1 By Mouth Daily 10)  Furosemide 40 Mg  Tabs (Furosemide) .... 2 Tab Qam...1 Tab Qpm 11)  Klor-Con M20 20 Meq  Tbcr (Potassium Chloride Crys Cr) .... 2 Tabs Qam..1 Tab Qpm 12)  Vitamin C 500 Mg Tabs (Ascorbic Acid) .[name].. 1 By Mouth Once Daily 13)  Citracal Petites/vitamin D 200-250 Mg-Unit Tabs (Calcium Citrate-Vitamin D) .[name].. 1 By Mouth Two Times A Day 14)  Glimepiride 2 Mg Tabs (Glimepiride) .[name].. 1 Q Am 15)   Zegerid 40-1100 Mg Caps (Omeprazole-Sodium Bicarbonate) .[name].. 1 Each Am 16)  Nitroglycerin 0.4 Mg Subl (Nitroglycerin) .... One Tablet Under Tongue Every 5 Minutes As Needed For Chest Pain---May Repeat Times Three 17)  Fluticasone Propionate 50 Mcg/act Susp (Fluticasone Propionate) .[name].. 1 Spray Two Times A Day 18)  Align  Caps (Probiotic Product) .[name].. 1 Cap Once Daily As Needed 19)  Cpap and 2liters O2 At Night .... At Bedtime 20)  Nebulizer Compressor  Misc (Nebulizers) .... Use With Proventil For Nebulizer Treatment Once Daily 21)  Gabapentin 100 Mg Caps (Gabapentin) .[name].. 1 At Bedtime As Needed 22)  Colcrys 0.6 Mg Tabs (Colchicine) .[name].. 1 Once Daily ( Hold Lipitor While On This) 23)  Promethazine Hcl 25 Mg Tabs (Promethazine Hcl) .[name].. 1 By Mouth Qid As Needed  Allergies (verified): 1)  ! * Benazepril 2)  ! * Hctz  Past History:  Past Medical History: Last updated: 03/20/2010 Gout Esophageal stricture, PMH of 3-4X Coronary artery disease Mixed hyperlipidemia  Past Surgical History: Last updated: 03/20/2010 Angioplasty 1993 Cath and stent placed 05/1997 Vastectomy 5 bypass surgery  03/2000 EGD with esophageal dilation , Dr Lyla Son Tonsillectomy Appendectomy  Family History: Last  updated: 03/20/2010 Father: CVA, ? HTN Mother: Pancreatic cancer Siblings:bro: health complications of service in [place]  Social History: Last updated: 03/20/2010 Tobacco Use - No.  Alcohol Use - no Retired Single  Risk Factors: Alcohol Use: 0 (03/20/2010) Caffeine Use: none (03/20/2010) Diet: no specific diet (03/20/2010)  Risk Factors: Smoking Status: never (03/20/2010)  Family History: Reviewed history from 03/20/2010 and no changes required. Father: CVA, ? HTN Mother: Pancreatic cancer Siblings:bro: health complications of service in [place]  Social History: Reviewed history from 03/20/2010 and no changes required. Tobacco Use - No.  Alcohol Use -  no Retired Single  Review of Systems      See HPI  Physical Exam  General:  Well-developed,well-nourished,in no acute distress; alert,appropriate and cooperative throughout examination Mouth:  Oral mucosa and oropharynx without lesions or exudates.  Teeth in good repair. Neck:  No deformities, masses, or tenderness noted. Lungs:  Normal respiratory effort, chest expands symmetrically. Lungs are clear to auscultation, no crackles or wheezes. Heart:  normal rate and no murmur.   Abdomen:  midepigastric tenderness soft no distention and no guarding.   Skin:  Intact without suspicious lesions or rashes Psych:  Cognition and judgment appear intact. Alert and cooperative with normal attention span and concentration. No apparent delusions, illusions, hallucinations   Impression & Recommendations:  Problem # 1:  GASTROENTERITIS, VIRAL (ICD-008.8) phenergan 25mg   clear liquids today and advanced as tolerated if vomiting returns we can give injection or go to ER Orders: Venipuncture [id number] TLB-BMP (Basic Metabolic Panel-BMET) (99991111) TLB-CBC Platelet - w/Differential (85025-CBCD) TLB-Hepatic/Liver Function Pnl (80076-HEPATIC) Specimen Handling (99000) UA Dipstick w/o Micro (manual) (81002)  Complete Medication List: 1)  Toprol Xl 100 Mg Tb24 (Metoprolol succinate) .[name].. 1 1/2 by mouth once daily 2)  Lipitor 40 Mg Tabs (Atorvastatin calcium) .... Take 1 tablet by mouth once a day 3)  Aspirin Ec 81 Mg Tbec (Aspirin) .... Take 1 tablet by mouth every morning 4)  Zetia 10 Mg Tabs (Ezetimibe) .... Take 1 tablet by mouth once a day 5)  Allopurinol 300 Mg Tabs (Allopurinol) .... Take 1 tablet by mouth once a day 6)  Metformin Hcl 1000 Mg Tabs (Metformin hcl) .[name].. 1 by mouth two times a day 7)  Freestyle Lancets Misc (Lancets) .... Check blood sugar daily 8)  Freestyle Lite Test Strp (Glucose blood) .... Test once daily 9)  Amlodipine Besylate 5 Mg Tabs (Amlodipine besylate) .[name].. 1  by mouth daily 10)  Furosemide 40 Mg Tabs (Furosemide) .... 2 tab qam...1 tab qpm 11)  Klor-con M20 20 Meq Tbcr (Potassium chloride crys cr) .... 2 tabs qam..1 tab qpm 12)  Vitamin C 500 Mg Tabs (Ascorbic acid) .[name].. 1 by mouth once daily 13)  Citracal Petites/vitamin D 200-250 Mg-unit Tabs (Calcium citrate-vitamin d) .[name].. 1 by mouth two times a day 14)  Glimepiride 2 Mg Tabs (Glimepiride) .[name].. 1 q am 15)  Zegerid 40-1100 Mg Caps (Omeprazole-sodium bicarbonate) .[name].. 1 each am 16)  Nitroglycerin 0.4 Mg Subl (Nitroglycerin) .... One tablet under tongue every 5 minutes as needed for chest pain---may repeat times three 17)  Fluticasone Propionate 50 Mcg/act Susp (Fluticasone propionate) .[name].. 1 spray two times a day 18)  Align Caps (Probiotic product) .[name].. 1 cap once daily as needed 19)  Cpap and 2liters O2 At Night  .... At bedtime 20)  Nebulizer Compressor Misc (Nebulizers) .... Use with proventil for nebulizer treatment once daily 21)  Gabapentin 100 Mg Caps (Gabapentin) .[name].. 1 at  bedtime as needed 22)  Colcrys 0.6 Mg Tabs (Colchicine) .[name].. 1 once daily ( hold lipitor while on this) 23)  Promethazine Hcl 25 Mg Tabs (Promethazine hcl) .[name].. 1 by mouth qid as needed Prescriptions: PROMETHAZINE HCL 25 MG TABS (PROMETHAZINE HCL) 1 by mouth qid as needed  #30 x 0   Entered and Authorized by:   Garnet Koyanagi DO   Signed by:   Garnet Koyanagi DO on 05/29/2010   Method used:   Electronically to        [facility]. # [id number]* (retail)       [address]       [place], [place]  [address]       Ph: [phone] or [phone]       Fax: [phone]   RxID:   [id number]    Orders Added: 1)  Venipuncture [id number] 2)  TLB-BMP (Basic Metabolic Panel-BMET) 123456 3)  TLB-CBC Platelet - w/Differential [85025-CBCD] 4)  TLB-Hepatic/Liver Function Pnl [80076-HEPATIC] 5)  Specimen Handling [99000] 6)  UA Dipstick w/o Micro (manual) [81002] 7)  Est. Patient Level III  [id number]    Laboratory Results   Urine Tests   Date/Time Reported: May 29, 2010 11:34 AM   Routine Urinalysis   Color: orange Appearance: Clear Glucose: negative   (Normal Range: Negative) Bilirubin: negative   (Normal Range: Negative) Ketone: negative   (Normal Range: Negative) Spec. Gravity: 1.015   (Normal Range: 1.003-1.035) Blood: negative   (Normal Range: Negative) pH: 5.0   (Normal Range: 5.0-8.0) Protein: negative   (Normal Range: Negative) Urobilinogen: negative   (Normal Range: 0-1) Nitrite: negative   (Normal Range: Negative) Leukocyte Esterace: negative   (Normal Range: Negative)    Comments: Heath Lark  May 29, 2010 11:34 AM

## 2010-08-02 NOTE — Progress Notes (Signed)
Summary: pt needs 90day supply and sent to mail order pls   Phone Note Refill Request Call back at Home Phone [phone] Message from:  Patient on [facility] mail order/  Refills Requested: Medication #1:  LIPITOR 40 MG TABS Take 1 tablet by mouth once a day needs 90 day supply  Initial call taken by: Shelda Pal,  July 06, 2009 8:53 AM    Prescriptions: LIPITOR 40 MG TABS (ATORVASTATIN CALCIUM) Take 1 tablet by mouth once a day  #90 x 3   Entered by:   Julaine Hua, CMA   Authorized by:   Renella Cunas, MD, [facility]   Signed by:   Julaine Hua, CMA on 07/06/2009   Method used:   Faxed to ...       [facility] (mail-order)             , [place]         Ph: [phone]       Fax: [phone]   RxID:   [id number]

## 2010-08-02 NOTE — Progress Notes (Signed)
Summary: refill  Phone Note Refill Request Message from:  Fax from Pharmacy on April 09, 2010 10:09 AM  Refills Requested: Medication #1:  METFORMIN HCL 1000 MG TABS 1 by mouth two times a day [facility] - fax [phone]  Initial call taken by: Arbie [place],  April 09, 2010 10:09 AM    Prescriptions: METFORMIN HCL 1000 MG TABS (METFORMIN HCL) 1 by mouth two times a day  #180 x 1   Entered by:   [place] by:   Unice Cobble MD   Signed by:   Georgette Dover CMA on 04/09/2010   Method used:   Electronically to        [facility] [address] (retail)       [address]       [place], [place]  [address]       Ph: [phone]       Fax: [phone]   RxID:   [id number]

## 2010-08-02 NOTE — Letter (Signed)
Summary: CMN for Oxygen/[facility]  CMN for Oxygen/[facility]   Imported By: Edmonia James 10/03/2009 10:08:28  _____________________________________________________________________  External Attachment:    Type:   Image     Comment:   External Document

## 2010-08-02 NOTE — Miscellaneous (Signed)
Summary: Order for Nebulizer/[facility]  Order for Nebulizer/[facility]   Imported By: Edmonia James 08/25/2009 [id number]  _____________________________________________________________________  External Attachment:    Type:   Image     Comment:   External Document

## 2010-08-02 NOTE — Progress Notes (Signed)
Summary: Colcrys--lost rx  Phone Note Refill Request Call back at Work Phone [phone] Message from:  Patient on May 21, 2010 11:11 AM  Refills Requested: Medication #1:  COLCRYS 0.6 MG TABS 1 once daily ( hold Lipitor while on this). Patient left message on triage noting that he lost his prescription for the above. Please send to [facility] on [address].  Initial call taken by: [name] CMA,  May 21, 2010 11:11 AM  Follow-up for Phone Call        Patient notes that the pills have helped him, but he lost the paper prescription. Patient Lynnell Grain we are faxing now. Follow-up by: [name] CMA,  May 21, 2010 1:06 PM    Prescriptions: COLCRYS 0.6 MG TABS (COLCHICINE) 1 once daily ( hold Lipitor while on this)  #30 x 0   Entered by:   [name] CMA   Authorized by:   Unice Cobble MD   Signed by:   [name] CMA on 05/21/2010   Method used:   Electronically to        [facility]. # [id number]* (retail)       [address]       [place], [place]  [address]       Ph: [phone] or [phone]       Fax: [phone]   RxID:   [id number]

## 2010-08-02 NOTE — Assessment & Plan Note (Signed)
Summary: gout attach/cbs   Vital Signs:  Patient profile:   70 year old male Weight:      266 pounds BMI:     34.98 Temp:     97.9 degrees F oral Pulse rate:   76 / minute Resp:     16 per minute BP sitting:   144 / 70  (left arm) Cuff size:   large  Vitals Entered By: Georgette Dover CMA (May 07, 2010 10:22 AM) CC: Gout attack-out of allopurinol x 2 weeks, Lower Extremity Joint pain   Primary Care Provider:  Unice Cobble MD  CC:  Gout attack-out of allopurinol x 2 weeks and Lower Extremity Joint pain.  History of Present Illness:  Lower Extremity Joint Pain      This is a 70 year old man who presents with Lower Extremity Joint pain as of 11/05 upon awakening . Inadvertently he had been off Allopurinol X 2 weeks . "Exactly like past gout attacks! ". The patient reports swelling and redness.  The pain is located in the right  great toe.  The pain began suddenly and with no injury.  The pain is described as sharp and constant.  The patient denies the following symptoms: fever, rash, photosensitivity, eye symptoms, diarrhea, and dysuria.  FBS 87-98.  Current Medications (verified): 1)  Toprol Xl 100 Mg Tb24 (Metoprolol Succinate) .[name].. 1 1/2 By Mouth Once Daily 2)  Lipitor 40 Mg Tabs (Atorvastatin Calcium) .... Take 1 Tablet By Mouth Once A Day 3)  Aspirin Ec 81 Mg Tbec (Aspirin) .... Take 1 Tablet By Mouth Every Morning 4)  Zetia 10 Mg Tabs (Ezetimibe) .... Take 1 Tablet By Mouth Once A Day 5)  Allopurinol 300 Mg Tabs (Allopurinol) .... Take 1 Tablet By Mouth Once A Day 6)  Metformin Hcl 1000 Mg Tabs (Metformin Hcl) .[name].. 1 By Mouth Two Times A Day 7)  Freestyle Lancets  Misc (Lancets) .... Check Blood Sugar Daily 8)  Freestyle Lite Test  Strp (Glucose Blood) .... Test Once Daily 9)  Amlodipine Besylate 5 Mg Tabs (Amlodipine Besylate) .[name].. 1 By Mouth Daily 10)  Furosemide 40 Mg  Tabs (Furosemide) .... 2 Tab Qam...1 Tab Qpm 11)  Klor-Con M20 20 Meq  Tbcr (Potassium Chloride Crys  Cr) .... 2 Tabs Qam..1 Tab Qpm 12)  Vitamin C 500 Mg Tabs (Ascorbic Acid) .[name].. 1 By Mouth Once Daily 13)  Citracal Petites/vitamin D 200-250 Mg-Unit Tabs (Calcium Citrate-Vitamin D) .[name].. 1 By Mouth Two Times A Day 14)  Glimepiride 2 Mg Tabs (Glimepiride) .[name].. 1 Q Am 15)  Zegerid 40-1100 Mg Caps (Omeprazole-Sodium Bicarbonate) .[name].. 1 Each Am 16)  Nitroglycerin 0.4 Mg Subl (Nitroglycerin) .... One Tablet Under Tongue Every 5 Minutes As Needed For Chest Pain---May Repeat Times Three 17)  Fluticasone Propionate 50 Mcg/act Susp (Fluticasone Propionate) .[name].. 1 Spray Two Times A Day 18)  Align  Caps (Probiotic Product) .[name].. 1 Cap Once Daily As Needed 19)  Cpap and 2liters O2 At Night .... At Bedtime 20)  Nebulizer Compressor  Misc (Nebulizers) .... Use With Proventil For Nebulizer Treatment Once Daily 21)  Gabapentin 100 Mg Caps (Gabapentin) .[name].. 1 At Bedtime As Needed 22)  Hydromet 5-1.5 Mg/53ml Syrp (Hydrocodone-Homatropine) .[name].. 1 Tsp Every 6 Hrs As Needed  Allergies: 1)  ! * Benazepril 2)  ! * Hctz  Physical Exam  General:  in no acute distress; alert,appropriate and cooperative throughout examination;uncomfortable-appearing.   Pulses:  R and L carotid pulses are full and  equal bilaterally Extremities:  Classic podagra R great toe with exquisite tenderness , erythema & swelling Skin:  See foot Psych:  memory intact for recent and remote, normally interactive, and good eye contact.     Impression & Recommendations:  Problem # 1:  GOUT (ICD-274.9)  Acute flare His updated medication list for this problem includes:    Allopurinol 300 Mg Tabs (Allopurinol) .[name]... Take 1 tablet by mouth once a day  Orders: Prescription Created Electronically [id number]) Venipuncture [id number]) TLB-Uric Acid, Blood (84550-URIC)  Complete Medication List: 1)  Toprol Xl 100 Mg Tb24 (Metoprolol succinate) .[name].. 1 1/2 by mouth once daily 2)  Lipitor 40 Mg Tabs (Atorvastatin calcium) .... Take 1 tablet by mouth once  a day 3)  Aspirin Ec 81 Mg Tbec (Aspirin) .... Take 1 tablet by mouth every morning 4)  Zetia 10 Mg Tabs (Ezetimibe) .... Take 1 tablet by mouth once a day 5)  Allopurinol 300 Mg Tabs (Allopurinol) .... Take 1 tablet by mouth once a day 6)  Metformin Hcl 1000 Mg Tabs (Metformin hcl) .[name].. 1 by mouth two times a day 7)  Freestyle Lancets Misc (Lancets) .... Check blood sugar daily 8)  Freestyle Lite Test Strp (Glucose blood) .... Test once daily 9)  Amlodipine Besylate 5 Mg Tabs (Amlodipine besylate) .[name].. 1 by mouth daily 10)  Furosemide 40 Mg Tabs (Furosemide) .... 2 tab qam...1 tab qpm 11)  Klor-con M20 20 Meq Tbcr (Potassium chloride crys cr) .... 2 tabs qam..1 tab qpm 12)  Vitamin C 500 Mg Tabs (Ascorbic acid) .[name].. 1 by mouth once daily 13)  Citracal Petites/vitamin D 200-250 Mg-unit Tabs (Calcium citrate-vitamin d) .[name].. 1 by mouth two times a day 14)  Glimepiride 2 Mg Tabs (Glimepiride) .[name].. 1 q am 15)  Zegerid 40-1100 Mg Caps (Omeprazole-sodium bicarbonate) .[name].. 1 each am 16)  Nitroglycerin 0.4 Mg Subl (Nitroglycerin) .... One tablet under tongue every 5 minutes as needed for chest pain---may repeat times three 17)  Fluticasone Propionate 50 Mcg/act Susp (Fluticasone propionate) .[name].. 1 spray two times a day 18)  Align Caps (Probiotic product) .[name].. 1 cap once daily as needed 19)  Cpap and 2liters O2 At Night  .... At bedtime 20)  Nebulizer Compressor Misc (Nebulizers) .... Use with proventil for nebulizer treatment once daily 21)  Gabapentin 100 Mg Caps (Gabapentin) .[name].. 1 at bedtime as needed 22)  Hydromet 5-1.5 Mg/30ml Syrp (Hydrocodone-homatropine) .[name].. 1 tsp every 6 hrs as needed 23)  Prednisone 20 Mg Tabs (Prednisone) .[name].. 1 two times a day with food 24)  Indomethacin 50 Mg Caps (Indomethacin) .[name].. 1 three times a day as needed joint pain ; take with food  Patient Instructions: 1)  Do not restart Allopurinol until gout free for 10-14 days. Prescriptions: ALLOPURINOL 300 MG TABS  (ALLOPURINOL) Take 1 tablet by mouth once a day  #90 x 3   Entered and Authorized by:   Unice Cobble MD   Signed by:   Unice Cobble MD on 05/07/2010   Method used:   Print then Give to Patient   RxID:   [id number] INDOMETHACIN 50 MG CAPS (INDOMETHACIN) 1 three times a day as needed joint pain ; take with food  #21 x 0   Entered and Authorized by:   Unice Cobble MD   Signed by:   Unice Cobble MD on 05/07/2010   Method used:   Printed then faxed to ...       [facility]  [place] # [id number]* (retail)  [address]       [place], [place]  [address]       Ph: [phone] or [phone]       Fax: [phone]   RxID:   [id number] PREDNISONE 20 MG TABS (PREDNISONE) 1 two times a day with food  #14 x 0   Entered and Authorized by:   Unice Cobble MD   Signed by:   Unice Cobble MD on 05/07/2010   Method used:   Printed then faxed to ...       [facility]  [place] # [id number]* (retail)       [address]       [place], [place]  [address]       Ph: [phone] or [phone]       Fax: [phone]   RxID:   [id number]    Orders Added: 1)  Prescription Created Electronically [id number] 2)  Est. Patient Level III [id number] 3)  Venipuncture [id number] 4)  TLB-Uric Acid, Blood [84550-URIC]  Appended Document: gout attach/cbs

## 2010-08-08 NOTE — Consult Note (Signed)
Summary: [facility]   Imported By: Edmonia James 08/01/2010 14:15:51  _____________________________________________________________________  External Attachment:    Type:   Image     Comment:   External Document

## 2010-08-17 ENCOUNTER — Telehealth (INDEPENDENT_AMBULATORY_CARE_PROVIDER_SITE_OTHER): Payer: Self-pay | Admitting: *Deleted

## 2010-08-22 NOTE — Progress Notes (Signed)
Summary: REFILL   Phone Note Refill Request Message from:  Patient on August 17, 2010 2:30 PM  Refills Requested: Medication #1:  ZETIA 10 MG TABS Take 1 tablet by mouth once a day [place]  Initial call taken by: Delsa Sale,  August 17, 2010 2:30 PM  Follow-up for Phone Call        Rx faxed to pharmacy. Sidney Ace  August 17, 2010 3:18 PM     Prescriptions: ZETIA 10 MG TABS (EZETIMIBE) Take 1 tablet by mouth once a day  #90 x 3   Entered by:   Sidney Ace   Authorized by:   Renella Cunas, MD, [facility]   Signed by:   Sidney Ace on 08/17/2010   Method used:   Faxed to ...       [facility] (mail-order)             , [place]         Ph: [phone]       Fax: [phone]   RxID:   [id number]

## 2010-08-23 ENCOUNTER — Other Ambulatory Visit: Payer: Self-pay

## 2010-08-23 ENCOUNTER — Telehealth: Payer: Self-pay | Admitting: Cardiology

## 2010-08-23 ENCOUNTER — Encounter: Payer: Self-pay | Admitting: Internal Medicine

## 2010-08-23 ENCOUNTER — Encounter: Payer: Self-pay | Admitting: Cardiology

## 2010-08-28 NOTE — Progress Notes (Signed)
Summary: pt needs surgery/does he need an appt first   Phone Note Call from Patient   Caller: Patient [phone] Reason for Call: Talk to Nurse Summary of Call: pt needs surgery and his dr is sending a surgical clearence fax, pt calling to see if he needs to be seen first? Initial call taken by: Lorenda Hatchet,  August 23, 2010 9:33 AM  Follow-up for Phone Call        Pt needs surgical clearance for shoulder surgery with Dr. Gladstone Lighter. He denies any chest discomfort, angina, or shortness of breath.  Appt for fasting lab work as well as appt with Richardson Dopp Pa on 2/29. Pt missed having lab work in November 2011.  Follow-up by: Joelyn Oms RN,  August 23, 2010 10:06 AM     Appended Document: pt needs surgery/does he need an appt first clear for surgery.  Reviewed Mar Daring, MD  Appended Document: pt needs surgery/does he need an appt first I spoke with pt and he is not having any anginal symptoms. He is having a rotator cuff repair which occured from a fall while golfing. He is not on Lipitor at this time as he had a gout flare up after running out of his allopurinol. He is taking colchine and allopurinol. Pt states it will be at least another 40 days before restarting Lipitor.  He will call back when he restarts Lipitor. We will fax clearance letter to Dr. Gladstone Lighter. Horton Chin RN

## 2010-08-29 ENCOUNTER — Encounter: Payer: Self-pay | Admitting: Physician Assistant

## 2010-08-29 ENCOUNTER — Other Ambulatory Visit: Payer: Self-pay

## 2010-08-29 ENCOUNTER — Encounter: Payer: Self-pay | Admitting: Internal Medicine

## 2010-08-30 HISTORY — PX: SHOULDER SURGERY: SHX246

## 2010-09-06 ENCOUNTER — Encounter (HOSPITAL_COMMUNITY): Payer: Medicare Other

## 2010-09-06 ENCOUNTER — Other Ambulatory Visit: Payer: Self-pay | Admitting: Orthopedic Surgery

## 2010-09-06 ENCOUNTER — Other Ambulatory Visit (HOSPITAL_COMMUNITY): Payer: Self-pay | Admitting: Orthopedic Surgery

## 2010-09-06 ENCOUNTER — Ambulatory Visit (HOSPITAL_COMMUNITY)
Admission: RE | Admit: 2010-09-06 | Discharge: 2010-09-06 | Disposition: A | Discharge status: Home / Self Care | Payer: Medicare Other | Source: Ambulatory Visit | Attending: Orthopedic Surgery | Admitting: Orthopedic Surgery

## 2010-09-06 DIAGNOSIS — I10 Essential (primary) hypertension: Secondary | ICD-10-CM | POA: Insufficient documentation

## 2010-09-06 DIAGNOSIS — Z01810 Encounter for preprocedural cardiovascular examination: Secondary | ICD-10-CM | POA: Insufficient documentation

## 2010-09-06 DIAGNOSIS — I2510 Atherosclerotic heart disease of native coronary artery without angina pectoris: Secondary | ICD-10-CM | POA: Insufficient documentation

## 2010-09-06 DIAGNOSIS — Z01811 Encounter for preprocedural respiratory examination: Secondary | ICD-10-CM | POA: Insufficient documentation

## 2010-09-06 DIAGNOSIS — Z01812 Encounter for preprocedural laboratory examination: Secondary | ICD-10-CM | POA: Insufficient documentation

## 2010-09-06 DIAGNOSIS — Z01818 Encounter for other preprocedural examination: Secondary | ICD-10-CM | POA: Insufficient documentation

## 2010-09-06 DIAGNOSIS — S43429A Sprain of unspecified rotator cuff capsule, initial encounter: Secondary | ICD-10-CM | POA: Insufficient documentation

## 2010-09-06 DIAGNOSIS — E669 Obesity, unspecified: Secondary | ICD-10-CM | POA: Insufficient documentation

## 2010-09-06 DIAGNOSIS — Z79899 Other long term (current) drug therapy: Secondary | ICD-10-CM | POA: Insufficient documentation

## 2010-09-06 DIAGNOSIS — E785 Hyperlipidemia, unspecified: Secondary | ICD-10-CM | POA: Insufficient documentation

## 2010-09-06 DIAGNOSIS — X58XXXA Exposure to other specified factors, initial encounter: Secondary | ICD-10-CM | POA: Insufficient documentation

## 2010-09-06 DIAGNOSIS — Z951 Presence of aortocoronary bypass graft: Secondary | ICD-10-CM | POA: Insufficient documentation

## 2010-09-06 LAB — URINALYSIS, ROUTINE W REFLEX MICROSCOPIC
Bilirubin Urine: NEGATIVE
Ketones, ur: NEGATIVE mg/dL
Nitrite: NEGATIVE
Protein, ur: NEGATIVE mg/dL
Urobilinogen, UA: 0.2 mg/dL (ref 0.0–1.0)
pH: 6 (ref 5.0–8.0)

## 2010-09-06 LAB — COMPREHENSIVE METABOLIC PANEL
ALT: 30 U/L (ref 0–53)
AST: 24 U/L (ref 0–37)
CO2: 30 mEq/L (ref 19–32)
Calcium: 9.7 mg/dL (ref 8.4–10.5)
Chloride: 101 mEq/L (ref 96–112)
Creatinine, Ser: 0.9 mg/dL (ref 0.4–1.5)
GFR calc Af Amer: >60 mL/min (ref >60)
GFR calc non Af Amer: >60 mL/min (ref >60)
Glucose, Bld: 92 mg/dL (ref 70–99)
Total Bilirubin: 0.2 mg/dL — ABNORMAL LOW (ref 0.3–1.2)

## 2010-09-06 LAB — DIFFERENTIAL
Basophils Absolute: 0 10*3/uL (ref 0.0–0.1)
Basophils Relative: 1 % (ref 0–1)
Eosinophils Absolute: 0.2 10*3/uL (ref 0.0–0.7)
Eosinophils Relative: 2 % (ref 0–5)
Lymphs Abs: 2 10*3/uL (ref 0.7–4.0)
Neutrophils Relative %: 62 % (ref 43–77)

## 2010-09-06 LAB — CBC
MCV: 78.3 fL (ref 78.0–100.0)
Platelets: 302 10*3/uL (ref 150–400)
RBC: 5.31 MIL/uL (ref 4.22–5.81)
RDW: 15.6 % — ABNORMAL HIGH (ref 11.5–15.5)
WBC: 7.5 10*3/uL (ref 4.0–10.5)

## 2010-09-06 LAB — PROTIME-INR
INR: 0.98 (ref 0.00–1.49)
Prothrombin Time: 13.2 seconds (ref 11.6–15.2)

## 2010-09-06 LAB — APTT: aPTT: 36 seconds (ref 24–37)

## 2010-09-06 NOTE — Letter (Signed)
Summary: [facility]   Imported By: Laural Benes 08/31/2010 09:07:51  _____________________________________________________________________  External Attachment:    Type:   Image     Comment:   External Document

## 2010-09-06 NOTE — Letter (Signed)
Summary: [facility]   Imported By: Marilynne Drivers 08/31/2010 13:16:19  _____________________________________________________________________  External Attachment:    Type:   Image     Comment:   External Document

## 2010-09-06 NOTE — Letter (Signed)
Summary: [facility] for [facility]xygen/[facility]  CMN for [facility]   Imported By: Laural Benes 08/31/2010 09:05:49  _____________________________________________________________________  External Attachment:    Type:   Image     Comment:   External Document

## 2010-09-11 ENCOUNTER — Encounter: Payer: Self-pay | Admitting: Internal Medicine

## 2010-09-12 ENCOUNTER — Ambulatory Visit (HOSPITAL_COMMUNITY)
Admission: RE | Admit: 2010-09-12 | Discharge: 2010-09-13 | Disposition: A | Discharge status: Home / Self Care | Payer: Medicare Other | Source: Ambulatory Visit | Attending: Orthopedic Surgery | Admitting: Orthopedic Surgery

## 2010-09-12 DIAGNOSIS — Z79899 Other long term (current) drug therapy: Secondary | ICD-10-CM | POA: Insufficient documentation

## 2010-09-12 DIAGNOSIS — S43429A Sprain of unspecified rotator cuff capsule, initial encounter: Secondary | ICD-10-CM | POA: Insufficient documentation

## 2010-09-12 DIAGNOSIS — I2510 Atherosclerotic heart disease of native coronary artery without angina pectoris: Secondary | ICD-10-CM | POA: Insufficient documentation

## 2010-09-12 DIAGNOSIS — X58XXXA Exposure to other specified factors, initial encounter: Secondary | ICD-10-CM | POA: Insufficient documentation

## 2010-09-12 DIAGNOSIS — Z951 Presence of aortocoronary bypass graft: Secondary | ICD-10-CM | POA: Insufficient documentation

## 2010-09-12 DIAGNOSIS — Z23 Encounter for immunization: Secondary | ICD-10-CM | POA: Insufficient documentation

## 2010-09-12 DIAGNOSIS — M25819 Other specified joint disorders, unspecified shoulder: Secondary | ICD-10-CM | POA: Insufficient documentation

## 2010-09-12 DIAGNOSIS — E669 Obesity, unspecified: Secondary | ICD-10-CM | POA: Insufficient documentation

## 2010-09-12 DIAGNOSIS — G4733 Obstructive sleep apnea (adult) (pediatric): Secondary | ICD-10-CM | POA: Insufficient documentation

## 2010-09-12 LAB — GLUCOSE, CAPILLARY
Glucose-Capillary: 110 mg/dL — ABNORMAL HIGH (ref 70–99)
Glucose-Capillary: 132 mg/dL — ABNORMAL HIGH (ref 70–99)

## 2010-09-13 LAB — GLUCOSE, CAPILLARY: Glucose-Capillary: 110 mg/dL — ABNORMAL HIGH (ref 70–99)

## 2010-09-14 NOTE — Op Note (Signed)
  NAME:  LUCUS, FAUTEUX NO.:  [id number]  MEDICAL RECORD NO.:  [id number]           PATIENT TYPE:  O  LOCATION:  DAYL                         FACILITY[facility]  [facility]  PHYSICIAN:  Kipp Brood. Prajna Vanderpool, M.D.DATE OF BIRTH:  19-Jul-1940  DATE OF PROCEDURE:  09/12/2010 DATE OF DISCHARGE:                              OPERATIVE REPORT   SURGEON:  Kipp Brood. Gladstone Lighter, M.D.  ASSISTANT:  Toniann Fail, [facility]  PREOPERATIVE DIAGNOSES: 1. Severe complex retracted tear of the rotator cuff tendon, right     shoulder. 2. Severe impingement syndrome, right shoulder.  POSTOPERATIVE DIAGNOSES: 1. Severe complex retracted tear of the rotator cuff tendon, right     shoulder. 2. Severe impingement syndrome, right shoulder.  OPERATION: 1. Open repair of the right rotator cuff tendon which was a complex     retracted tear. 2. Tissue mend graft for reinforcement of the repair. 3. Three Stryker anchors were used. 4. Open acromionectomy, right shoulder.  DESCRIPTION OF PROCEDURE:  Under general anesthesia, routine orthopedic prepping and draping of the right shoulder was carried out with the patient on the beach-chair position.  At this time, the appropriate time- out was carried out first.  Also his right arm was marked in the holding area.  The patient had 2 g of IV Ancef.  After sterile prep and draping, I made an incision over the anterior aspect of right shoulder.  Bleeders were identified and cauterized.  I stripped the deltoid tendon from the acromion in the usual fashion.  I split the proximal part of the deltoid.  Self-retaining retractors were inserted.  I then identified the subdeltoid bursa and excised that.  Note, he had severe impingement due to severe overgrowth of the acromion and he completely obliterated the subacromial space.  I protected the underlying tendon with the Bennett retractor utilizing oscillating saw and a bur to do a partial acromionectomy and acromioplasty.   Following that, we thoroughly irrigated out the area.  I grasped the rotator cuff which was retracted, pulled it forward after I burred the lateral articular surface of the humerus.  I then inserted 3 Stryker anchors, sutured the tendon down in place.  Then the third anchor was utilized for reinforcement with a tissue mend graft.  I thoroughly irrigated out the area, reapproximated deltoid tendon to the muscle in usual fashion.  Subcu was closed with 0 Vicryl, skin with metal staples.  Sterile Neosporin dressing was applied.  He was placed in a shoulder immobilizer with a side pillow.          ______________________________ Kipp Brood Gladstone Lighter, M.D.     RAG/MEDQ  D:  09/12/2010  T:  09/12/2010  Job:  [id number]  cc:   Jori Moll A. Gladstone Lighter, M.D. Fax: [place]. [place], [place], [address] [place] [address]  Electronically Signed by Latanya Maudlin M.D. on 09/14/2010 07:20:32 AM

## 2010-09-17 ENCOUNTER — Telehealth: Payer: Self-pay | Admitting: Cardiology

## 2010-09-27 NOTE — Progress Notes (Signed)
Summary: Swelling in feet   Phone Note Call from Patient Call back at [phone]   Caller: Daughter/ Dawn Summary of Call: Pt having swelling in feet Initial call taken by: Delsa Sale,  September 17, 2010 9:01 AM  Follow-up for Phone Call        I talked with daughter, Dawn--pt had right rotator cuff surgery 09/12/10 by Dr Gladstone Lighter -pt was discharged 09/13/10 -since surgery pt has noticed increased swelling in both feet and ankles-daughter states she noticed some increase in swelling in his feet and ankles while pt was still in the hopsital-the edema has gotten worse since pt has been home--due to his should surgery pt is unable to elevate his feet and legs very much-daughter states pt denies increase in weight,SOB or orthopnea---right hand is also swollen and daughter has a call into Dr [employer] office about that-daughter states pt is asking if his Lasix should  be increased--daughter states pt takes Lasix 80mg  in the morning (two 40mg  tablets) and 40mg  in the afternoon--I will forward to Dr Verl Blalock for review and recommendations Desiree Lucy, RN, BSN  September 17, 2010 2:25 PM      Appended Document: Swelling in feet Increase to 80mg  two times a day, potassium rich diet as well, increase 9meq two times a day till swelling decreases to baseline, the back to current regimen.  Appended Document: Swelling in feet I discussed with daughter, Dawn--she verbalized understanding   Clinical Lists Changes  Medications: Changed medication from FUROSEMIDE 40 MG  TABS (FUROSEMIDE) 2 tab qam...1 tab qpm to FUROSEMIDE 40 MG  TABS (FUROSEMIDE) 2 tab qam...2 tab qpm Changed medication from KLOR-CON M20 20 MEQ  TBCR (POTASSIUM CHLORIDE CRYS CR) 2 tabs qam..1 tab qpm to KLOR-CON M20 20 MEQ  TBCR (POTASSIUM CHLORIDE CRYS CR) 2 tabs qam..2 tab qpm Observations: Added new observation of MEDRECON: current updated (09/17/2010 14:54)       Current Medications (verified): 1)  Toprol Xl 100 Mg Tb24  (Metoprolol Succinate) .[name].. 1 1/2 By Mouth Once Daily 2)  Lipitor 40 Mg Tabs (Atorvastatin Calcium) .... Take 1 Tablet By Mouth Once A Day 3)  Aspirin Ec 81 Mg Tbec (Aspirin) .... Take 1 Tablet By Mouth Every Morning 4)  Zetia 10 Mg Tabs (Ezetimibe) .... Take 1 Tablet By Mouth Once A Day 5)  Allopurinol 300 Mg Tabs (Allopurinol) .... 1/2 By Mouth X 1 Week, Then Increase To 1 By Mouth Once Daily 6)  Metformin Hcl 1000 Mg Tabs (Metformin Hcl) .[name].. 1 By Mouth Two Times A Day 7)  Freestyle Lancets  Misc (Lancets) .... Check Blood Sugar Daily 8)  Freestyle Lite Test  Strp (Glucose Blood) .... Test Once Daily 9)  Amlodipine Besylate 5 Mg Tabs (Amlodipine Besylate) .[name].. 1 By Mouth Daily 10)  Furosemide 40 Mg  Tabs (Furosemide) .... 2 Tab Qam...2 Tab Qpm 11)  Klor-Con M20 20 Meq  Tbcr (Potassium Chloride Crys Cr) .... 2 Tabs Qam..2 Tab Qpm 12)  Vitamin C 500 Mg Tabs (Ascorbic Acid) .[name].. 1 By Mouth Once Daily 13)  Citracal Petites/vitamin D 200-250 Mg-Unit Tabs (Calcium Citrate-Vitamin D) .[name].. 1 By Mouth Two Times A Day 14)  Glimepiride 2 Mg Tabs (Glimepiride) .[name].. 1 Q Am 15)  Zegerid 40-1100 Mg Caps (Omeprazole-Sodium Bicarbonate) .[name].. 1 Each Am 16)  Nitroglycerin 0.4 Mg Subl (Nitroglycerin) .... One Tablet Under Tongue Every 5 Minutes As Needed For Chest Pain---May Repeat Times Three 17)  Fluticasone Propionate 50 Mcg/act Susp (Fluticasone Propionate) .[name].. 1 Spray Two  Times A Day 18)  Align  Caps (Probiotic Product) .[name].. 1 Cap Once Daily As Needed 19)  Cpap and 2liters O2 At Night .... At Bedtime 20)  Nebulizer Compressor  Misc (Nebulizers) .... Use With Proventil For Nebulizer Treatment Once Daily 21)  Gabapentin 100 Mg Caps (Gabapentin) .[name].. 1 At Bedtime As Needed 22)  Colcrys 0.6 Mg Tabs (Colchicine) .[name].. 1 Once Daily ( Hold Lipitor While On This) 23)  Loratadine 10 Mg Tabs (Loratadine) .[name].. 1 Once Daily As Needed For Allergies  Allergies: 1)  ! * Benazepril 2)  ! * Hctz

## 2010-10-02 ENCOUNTER — Other Ambulatory Visit: Payer: Self-pay | Admitting: Internal Medicine

## 2010-10-02 LAB — GLUCOSE, CAPILLARY
Glucose-Capillary: 104 mg/dL — ABNORMAL HIGH (ref 70–99)
Glucose-Capillary: 118 mg/dL — ABNORMAL HIGH (ref 70–99)
Glucose-Capillary: 120 mg/dL — ABNORMAL HIGH (ref 70–99)
Glucose-Capillary: 123 mg/dL — ABNORMAL HIGH (ref 70–99)

## 2010-10-02 LAB — COMPREHENSIVE METABOLIC PANEL
Albumin: 4.1 g/dL (ref 3.5–5.2)
BUN: 12 mg/dL (ref 6–23)
CO2: 27 mEq/L (ref 19–32)
Calcium: 9.4 mg/dL (ref 8.4–10.5)
Chloride: 99 mEq/L (ref 96–112)
Creatinine, Ser: 0.94 mg/dL (ref 0.4–1.5)
GFR calc non Af Amer: >60 mL/min (ref >60)
Total Bilirubin: 0.6 mg/dL (ref 0.3–1.2)

## 2010-10-02 LAB — URINALYSIS, ROUTINE W REFLEX MICROSCOPIC
Ketones, ur: NEGATIVE mg/dL
Nitrite: NEGATIVE
Protein, ur: NEGATIVE mg/dL

## 2010-10-02 LAB — CBC
Platelets: 340 10*3/uL (ref 150–400)
RDW: 19.8 % — ABNORMAL HIGH (ref 11.5–15.5)
WBC: 7 10*3/uL (ref 4.0–10.5)

## 2010-10-02 LAB — DIFFERENTIAL
Basophils Absolute: 0 10*3/uL (ref 0.0–0.1)
Eosinophils Absolute: 0.1 10*3/uL (ref 0.0–0.7)
Lymphocytes Relative: 21 % (ref 12–46)
Neutrophils Relative %: 71 % (ref 43–77)

## 2010-10-02 LAB — PROTIME-INR: Prothrombin Time: 13.6 seconds (ref 11.6–15.2)

## 2010-10-05 ENCOUNTER — Encounter: Payer: Self-pay | Admitting: Cardiology

## 2010-10-08 ENCOUNTER — Encounter: Payer: Self-pay | Admitting: Cardiology

## 2010-10-08 ENCOUNTER — Ambulatory Visit (INDEPENDENT_AMBULATORY_CARE_PROVIDER_SITE_OTHER): Payer: Medicare Other | Admitting: Cardiology

## 2010-10-08 VITALS — BP 148/66 | HR 58 | Resp 18 | Ht 72.0 in | Wt 263.0 lb

## 2010-10-08 DIAGNOSIS — E782 Mixed hyperlipidemia: Secondary | ICD-10-CM

## 2010-10-08 DIAGNOSIS — E785 Hyperlipidemia, unspecified: Secondary | ICD-10-CM

## 2010-10-08 DIAGNOSIS — R609 Edema, unspecified: Secondary | ICD-10-CM

## 2010-10-08 DIAGNOSIS — I25810 Atherosclerosis of coronary artery bypass graft(s) without angina pectoris: Secondary | ICD-10-CM

## 2010-10-08 NOTE — Progress Notes (Signed)
   Patient ID: Richard [name], male    DOB: 1940-11-07, 70 y.o.   MRN: [id number]  HPI  Richard [name] returns for his history of CAD and hx of CABG. He has had no angina or chest pain. He damaged  His right  shoulder in Jan when he fell while playing golf. Had extensive surgery and is recovering. He is off his Lipitor because of a gout medicine he had to take. He just finished and will restart his Lipitor. His edema has markedly improved with increased diuretics.    Review of Systems  All other systems reviewed and are negative.      Physical Exam  Nursing note and vitals reviewed. Constitutional: He is oriented to person, place, and time. No distress.       obese  HENT:  Head: Normocephalic and atraumatic.  Eyes: EOM are normal. Pupils are equal, round, and reactive to light.  Neck: Neck supple. No JVD present. No tracheal deviation present. No thyromegaly present.  Cardiovascular: Normal rate, regular rhythm, S1 normal, S2 normal and intact distal pulses.   No extrasystoles are present. PMI is not displaced.  Exam reveals no S3 and no S4.   Murmur heard.  Crescendo systolic murmur is present with a grade of 2/6  Pulses:      Carotid pulses are 2+ on the right side, and 2+ on the left side.      Radial pulses are 2+ on the right side, and 2+ on the left side.       Femoral pulses are 2+ on the right side, and 2+ on the left side.      Popliteal pulses are 2+ on the right side, and 2+ on the left side.       Dorsalis pedis pulses are 2+ on the right side, and 2+ on the left side.       Posterior tibial pulses are 2+ on the right side, and 2+ on the left side.  Pulmonary/Chest: Effort normal and breath sounds normal.  Abdominal: Soft. Bowel sounds are normal. There is no tenderness.  Musculoskeletal: He exhibits no edema.  Neurological: He is alert and oriented to person, place, and time.  Skin: Skin is warm and dry.  Psychiatric: He has a normal mood and affect.

## 2010-10-08 NOTE — Assessment & Plan Note (Signed)
Stable, no change in treatment.

## 2010-10-08 NOTE — Assessment & Plan Note (Signed)
Restart Lipitor, check labs in 6 weeks.

## 2010-10-08 NOTE — Patient Instructions (Signed)
Your physician recommends that you schedule a follow-up appointment in: 6 months with Dr. Verl Blalock Your physician recommends that you return for a FASTING lipid profile in 6 weeks.

## 2010-10-11 LAB — BASIC METABOLIC PANEL
BUN: 14 mg/dL (ref 6–23)
Chloride: 103 mEq/L (ref 96–112)
Glucose, Bld: 84 mg/dL (ref 70–99)
Potassium: 4.5 mEq/L (ref 3.5–5.1)

## 2010-10-11 LAB — GLUCOSE, CAPILLARY: Glucose-Capillary: 118 mg/dL — ABNORMAL HIGH (ref 70–99)

## 2010-10-11 LAB — CBC
HCT: 47.6 % (ref 39.0–52.0)
MCV: 86.3 fL (ref 78.0–100.0)
Platelets: 273 10*3/uL (ref 150–400)
RDW: 15.7 % — ABNORMAL HIGH (ref 11.5–15.5)
WBC: 7.1 10*3/uL (ref 4.0–10.5)

## 2010-10-18 ENCOUNTER — Encounter: Payer: Self-pay | Admitting: Internal Medicine

## 2010-10-18 ENCOUNTER — Ambulatory Visit (INDEPENDENT_AMBULATORY_CARE_PROVIDER_SITE_OTHER): Payer: Medicare Other | Admitting: Internal Medicine

## 2010-10-18 DIAGNOSIS — J329 Chronic sinusitis, unspecified: Secondary | ICD-10-CM

## 2010-10-18 DIAGNOSIS — J309 Allergic rhinitis, unspecified: Secondary | ICD-10-CM

## 2010-10-18 DIAGNOSIS — J449 Chronic obstructive pulmonary disease, unspecified: Secondary | ICD-10-CM

## 2010-10-18 MED ORDER — AMOXICILLIN-POT CLAVULANATE 875-125 MG PO TABS: 1.0000 | ORAL_TABLET | Freq: Two times a day (BID) | ORAL | Status: AC

## 2010-10-18 NOTE — Patient Instructions (Signed)
Please drink as much nondairy fluids  You  can tolerate to thin the secretions. Plain Mucinex is also an option to thin the secretions. Use Allegra 180 Daily in  Place of Zyrtec. Use the generic Flonase twice a day after the Neti pot.

## 2010-10-18 NOTE — Progress Notes (Signed)
  Subjective:    Patient ID: Richard [name], male    DOB: [birth year], 70 y.o.   MRN: [id number]  HPIUPPER RESPIRATORY INFECTION  Onset: 04/16  Course: slightly improved Better with: Neti pot Meds tried: Loratidine , Zyrtec Sick contacts: no  Nasal discharge (color,laterality): yellow from both sides  Sinusitis Risk Factors Fever: no   Headache/face pain: no  Double sickening: no  Tooth pain: no   Allergy Risk Factors: Sneezing: yes, severe   Itchy scratchy throat: yes  Seasonal sx: yes, this most severe to date   Flu Risk Factors Headache: no  Muscle aches: no  Severe fatigue: no    Red Flags  Stiff neck: no  Dyspnea: no  Rash: no  Swallowing difficulty: no        Review of Systems     Objective:   Physical Exam General appearance is one of good health and nourishment. Skull is normocephalic without lymphadenopathy about the head, neck, or axilla. See current vital signs Eye - Pupils Equal Round Reactive to light, Extraocular movements intact Conjunctiva without redness or discharge Ears:  External ear exam shows no significant lesions or deformities.  Otoscopic examination reveals wax on L ; R TM clear. Hearing is grossly normal bilaterall Nose:  External nasal examination shows no deformity or inflammation. Nasal mucosa are pink and moist without lesions or exudates. No septal dislocation or dislocation.No obstruction to airflow.Hyponasal speech. Mild erythema & edema of pharynx. Heart:  Normal rate and regular rhythm. S1 and S2 normal without gallop,  click, rub or other extra sounds. Grade 1 systolic murmur. Lungs:Chest clear to auscultation; no wheezes, rhonchi,rales ,or rubs present.No increased work of breathing.         Assessment & Plan:  #1 rhinosinusitis  #2 extrinsic components of  allergic rhinoconjunctivitis.  Plan: Numeric on and will be prescribed for 10 days. He'll be asked to use Allegra in place of Zantac and  Loratidine

## 2010-11-04 ENCOUNTER — Other Ambulatory Visit: Payer: Self-pay | Admitting: Internal Medicine

## 2010-11-05 ENCOUNTER — Telehealth: Payer: Self-pay | Admitting: Cardiology

## 2010-11-05 ENCOUNTER — Other Ambulatory Visit: Payer: Self-pay | Admitting: Internal Medicine

## 2010-11-05 NOTE — Telephone Encounter (Signed)
Pt needs lipitor 40mg  for 90 days supply 3 refills to be called in to [facility] home delivery service. Pt states we have the number pt does not have the number.

## 2010-11-06 MED ORDER — ATORVASTATIN CALCIUM 40 MG PO TABS: 40.0000 mg | ORAL_TABLET | Freq: Every day | ORAL | Status: DC

## 2010-11-06 NOTE — Telephone Encounter (Signed)
Will fax to [employer] [facility]

## 2010-11-07 ENCOUNTER — Encounter: Payer: Self-pay | Admitting: Internal Medicine

## 2010-11-07 ENCOUNTER — Ambulatory Visit (INDEPENDENT_AMBULATORY_CARE_PROVIDER_SITE_OTHER): Payer: Medicare Other | Admitting: Internal Medicine

## 2010-11-07 VITALS — BP 130/72 | HR 64 | Temp 98.4°F | Wt 268.4 lb

## 2010-11-07 DIAGNOSIS — J0100 Acute maxillary sinusitis, unspecified: Secondary | ICD-10-CM

## 2010-11-07 MED ORDER — SULFAMETHOXAZOLE-TRIMETHOPRIM 800-160 MG PO TABS: 1.0000 | ORAL_TABLET | Freq: Two times a day (BID) | ORAL | Status: AC

## 2010-11-07 NOTE — Progress Notes (Signed)
  Subjective:    Patient ID: Richard [name], male    DOB: 1941-05-23, 70 y.o.   MRN: [id number]  HPI Upper respiratory tract infection Onset/symptoms:3 weeks ago Progression of symptoms worse :over past week Treatments/response:temporarily better with Amoxicillin Present symptoms:anosmia & nasal congestion Fever/chills/sweats:no Frontal headache:no Facial pain:no Nasal purulence:yes Sore throat:no Dental pain:no Lymphadenopathy:no Wheezing/shortness of breath:no Cough/sputum/hemoptysis:no Associated symptoms:loose stool         Review of Systems     Objective:   Physical Exam General appearance is of good health and nourishment; no acute distress or increased work of breathing is present.  No  lymphadenopathy about the head, neck, or axilla noted.   Eyes: No conjunctival inflammation or lid edema is present. There is no scleral icterus.  Ears:  External ear exam shows no significant lesions or deformities.  Otoscopic examination reveals clear canals, tympanic membranes are intact bilaterally without bulging, retraction, inflammation or discharge.  Nose:  External nasal examination shows no deformity or inflammation. Nasal mucosa are pink and moist without lesions or exudates. No septal dislocation or dislocation.No obstruction to airflow.  Hyponasal speech  Oral exam: Dental hygiene is good; lips and gums are healthy appearing.There is no oropharyngeal  exudate noted.  Mild erythema   Heart:  Normal rate and regular rhythm. S1 and S2 normal without gallop, click, rub or other extra sounds.  Grade 1/6 systolic murmur  Lungs:Chest clear to auscultation; no wheezes, rhonchi,rales ,or rubs present.No increased work of breathing.    Extremities:  No cyanosis, edema, or clubbing  noted    Skin: Warm & dry w/o jaundice or tenting.          Assessment & Plan:  #1 rhinosinusitis  Plan: Generic Septra DS x15 days.

## 2010-11-07 NOTE — Patient Instructions (Signed)
Please drink as much nondairy fluids as possible over the next several days to  Thin  secretions. Plain Mucinex is also effective.

## 2010-11-13 NOTE — Assessment & Plan Note (Signed)
[facility]                            CARDIOLOGY OFFICE NOTE   NAME:[name], Ord                       MRN:          [id number]  DATE:03/27/2007                            DOB:          1940/10/23    [name] comes in today for close followup of his blood pressure.  Please note that he had an angioedema reaction to benazepril.   We increased his amlodipine from 5 mg to 10 mg a day.   His blood pressure today is 140/78, a vast improvement from 176/82.  His  pulse is 85 and regular.  His weight is 261.  The rest of the exam is  unchanged.   I have asked [name] to check his blood pressure at rest on a number  of occasions over the next few weeks.  If he is running 140 or higher on  a regular basis, he will call us.  We will add a low-dose diuretic in  that case.  Otherwise, I will see him back in three months.     Thomas C. Verl Blalock, MD, [facility]  Electronically Signed    TCW/MedQ  DD: 03/27/2007  DT: 03/28/2007  Job #: [id number]

## 2010-11-13 NOTE — Assessment & Plan Note (Signed)
[facility]                            CARDIOLOGY OFFICE NOTE   NAME:Goyer, JAIDYN RENNO                       MRN:          [id number]  DATE:02/25/2008                            DOB:          08/29/1940    Mr. Toner returns today for further management of following issues.  1. Coronary artery disease.  He is currently having no angina, but      dyspnea on exertion.  He is due stress Myoview.  His last Myoview      was in February 05, 2006, with EF of 61 and no ischemia.  He is status      post coronary artery bypass surgery 9 years ago.  2. Mixed hyperlipidemia.  He is due lipids and he is fast today.  We      will also check a comprehensive metabolic panel.  3. Hypertension, which is fairly labile.  He has been under better      control recently.  4. Obesity.  5. Gastroesophageal reflux.   His medicines are unchanged since his last visit.  He has no orthopnea,  PND, tachy palpitations, presyncope, or syncope.   PHYSICAL EXAMINATION:  VITAL SIGNS:  His blood pressure today is 152/84,  pulse is 86 and regular, and his weight is 272.  HEENT:  Normocephalic and atraumatic.  PERRLA.  Extraocular movements  are intact.  Sclerae are clear.  Face symmetry is normal.  NECK:  Carotids are full.  No bruits.  Thyroid is not enlarged.  Trachea is  midline.  LUNGS:  Clear.  HEART:  A nondisplaced PMI.  Normal S1 and S2.  No gallop.  ABDOMEN:  Soft, no pulsatile mass or bruit.  EXTREMITIES:  No cyanosis or clubbing.  There is 1+ edema.  Pulses are  intact.  NEURO:  Intact.  SKIN:  Unremarkable.   ASSESSMENT AND PLAN:  Mr. Smuck is doing well.  We will arrange him to  have fasting lipids today and a comprehensive metabolic panel.  We will  also obtain a exercise rest stress Myoview, off of Toprol.  Assuming, he  is doing well.  We will see him back in 6 months.     Thomas C. Verl Blalock, MD, [facility]  Electronically Signed    TCW/MedQ  DD: 02/25/2008  DT:  02/26/2008  Job #: [id number]

## 2010-11-13 NOTE — Op Note (Signed)
NAME:  Richard [name], Richard [name] NO.:  [id number]   MEDICAL RECORD NO.:  [id number]          PATIENT TYPE:  OIB   LOCATION:  [id number]                         FACILITY:  [facility]   PHYSICIAN:  Onnie Graham, MD     DATE OF BIRTH:  August 19, 1940   DATE OF PROCEDURE:  DATE OF DISCHARGE:                               OPERATIVE REPORT   PREOPERATIVE DIAGNOSES:  1. Chronic maxillary sinusitis.  2. Bilateral inferior turbinate hypertrophy.   POSTOPERATIVE DIAGNOSES:  1. Chronic maxillary sinusitis.  2. Bilateral inferior turbinate hypertrophy.   PROCEDURE:  1. Bilateral maxillary antrostomies.  2. Bilateral inferior turbinate reduction.   SURGEON:  Leane Para. Redmond Baseman, MD   ANESTHESIA:  General endotracheal anesthesia.   COMPLICATIONS:  None.   INDICATIONS:  The patient is a 70 year old white male who complains of a  several-month history of nasal congestion and nasal obstruction.  Symptoms are worse when he lies down.  He has been treated with  antibiotics and nasal steroids with limited response.  He also has  obstructive sleep apnea but has not been able to tolerate CPAP very  well.  CT imaging demonstrates chronic edema of both maxillary sinuses,  worse on the right side.  He presents to the operating room for surgical  management.   FINDINGS:  1. The turbinates were enlarged.  2. The uncinate processes were collapsed over the maxillary ostia.      The internal maxillary sinus mucosa appeared healthy.   DESCRIPTION OF PROCEDURE:  The patient was identified in the holding  room and informed consent having been obtained including discussion of  risks, benefits, and alternatives, the patient was brought to the  operative suite and put on the operative table in supine position.  Anesthesia was induced.  The patient was intubated by the anesthesia  team without difficulty.  The patient was given intravenous antibiotics  during the case.  The eyes were lubricated and the bed was  turned 90  degrees from anesthesia.  Face was prepped and draped in sterile fashion  and Afrin pledgets were placed on both sides of the nose for several  minutes.  Pledgets were removed and the lateral nasal walls were  injected with 1% lidocaine with 1:100,000 epinephrine.  The middle  turbinates were medialized using a Soil scientist, and the uncinate  processes were then elevated off the lateral wall using a curved probe  and were then incised using a backbiter.  The uncinate process was then  removed from both sides using a microdebrider.  An angled telescope was  used to evaluate the ostia on both sides and the ostia were widened  using a curved microdebrider.  After this, the inferior turbinates were  injected with 1% lidocaine with 1:100,000 epinephrine.  Stab incisions  were made at the anterior head of the turbinates and the soft tissues  were then elevated using a Soil scientist.  A turbinate blade for the  microdebrider was then used to remove submucosal tissues keeping the  overlying mucosa and underlying bone intact.  The bones were then  lateralized using Valora Corporal  elevator.  The nose has been suctioned and Afrin  pledgets were replaced.  The throat was suctioned.  The patient was then  returned to the Anesthesia for wake up.  He was extubated and moved to  recovery room in stable condition.  The pledgets were removed after  extubation.      Onnie Graham, MD  Electronically Signed     DDB/MEDQ  D:  09/19/2008  T:  09/20/2008  Job:  [id number]

## 2010-11-13 NOTE — Assessment & Plan Note (Signed)
[facility]                            CARDIOLOGY OFFICE NOTE   NAME:FORBISBaiden, [name]                       MRN:          [id number]  DATE:01/30/2007                            DOB:          Mar 05, 1941    Richard [name] returns today for further management of the following issues.  1. Coronary artery disease.  He is status post coronary artery bypass      grafting about 8 years ago.  He is asymptomatic.  He is playing      golf 7 days a week during his retirement.  He is shooting on the      average about 67!  He had a stress Myoview February 05, 2006 which      showed an EF of 61%, no ischemia, mild septal dyssynergy secondary      to his bypass.  2. Mixed hyperlipidemia.  This has been a significant problem for him.      We had his triglycerides under control with a good diet and also      Lipitor and Zetia.  However, lipids Nov 07, 2006 showed a total      cholesterol of 145, triglycerides of 463, HDL 27, VLDL 93(!), and      direct LDL of 65.6.  His ALT was normal, creatinine was normal.      Potassium was normal.  3. Hypertension.  4. Obesity.  5. Gastroesophageal reflux.   MEDICATIONS:  1. Nexium 40 mg p.o. b.i.d.  2. Lipitor 40 mg a day.  3. Zetia 10 mg a day.  4. Toprol XL 100 mg a day.  5. Benazepril 40 mg a day.  6. Amlodipine 5 mg a day.  7. Allopurinol 300 mg a day.  8. Metformin 500 mg a day.  9. Citracal 250 b.i.d.  10.Vitamin C 500 mg a day.  11.Aspirin 81 mg a day.  12.Colchicine 0.6 mg p.r.n.   EXAM:  Blood pressure 140/72, pulse 84 and regular, weight 252.  He is gregarious as always.  He has a slightly ruddy complexion.  HEENT:  Normocephalic, atraumatic.  PERRLA.  Extraocular muscles are  intact.  Sclerae clear.  Facial symmetry is normal.  Carotid upstrokes are equal bilaterally without bruits.  No JVD.  Thyroid is not enlarged.  Trachea is midline.  LUNGS:  Clear.  HEART:  Reveals a normal S1, S2.  PMI was poorly  appreciated.  ABDOMEN:  Soft with good bowel sounds.  No midline bruit.  No  hepatomegaly.  EXTREMITIES:  No cyanosis, clubbing, or edema.  Pulses are intact.  NEURO:  Intact.  SKIN:  Shows a few ecchymoses.   EKG is normal, except for poor R wave progression across the anterior  precordium.  It is not changed.   ASSESSMENT AND PLAN:  Richard [name] is doing well from a cardiovascular  standpoint.  I am very concerned about his mixed hyperlipidemia.  He had  the audacity to ask me today what he is supposed to eat?  As I told  him, he knows better.  I reviewed overall how to avoid complex  and  simple sweets.  He does not drink alcohol.   I have asked him to return for fasting lipids and LFTs.  Hopefully these  triglycerides will come back down.   I will plan on seeing him back in 6 months.     Thomas C. Verl Blalock, MD, [facility]  Electronically Signed    TCW/MedQ  DD: 01/30/2007  DT: 01/30/2007  Job #: [id number]

## 2010-11-13 NOTE — Assessment & Plan Note (Signed)
[facility]                            CARDIOLOGY OFFICE NOTE   NAME:FORBISJourdan, Cerullo                       MRN:          [id number]  DATE:07/24/2007                            DOB:          03/16/1941    Mr. Pile comes in today for further management of hypertension.  I  increased his HCTZ to 25 mg a day.  This was on June 23, 2007.  At  that time his Chem-7 was stable.   Unfortunately he continues to eat too much, and he has gained 5 more  pounds.  I think some of this is obviously fluid with now 2+ pitting  edema all way up to his pretibial area.   He denies any orthopnea, PND or chest discomfort.   His blood pressure is higher today than it was last time, it is 166/94,  his pulse 88 and regular, weight is 265.  HEENT:  He has got a ruddy complexion, PERRLA, extraocular movements  intact, sclera are clear, facial symmetry is normal.  Carotid upstrokes  are equal bilaterally without bruits, no JVD, thyroid is not enlarged,  trachea is midline.  LUNGS:  Clear.  HEART:  Reveals a nondisplaced PMI, he has a soft S1 and S2.  ABDOMINAL:  Protuberant with good bowel sounds.  EXTREMITIES:  Reveal 2+ edema pretibially.  Pulses are intact but  reduced.  NEURO:  Exam is intact.  SKIN:  Unremarkable.   I had a long talk with Mr. Joung today.  He has clearly got to get on  the wagon with his diet and lose hopefully 10-15 pounds over the next  couple of months.  He needs to restricts salt sodium as much as  possible.  I have changed his HCTZ over to furosemide 40 mg p.o. q.a.m.  with a potassium supplement of 20 mEq a day.  I have asked him to weigh  every morning on his scales and record it.  I will see him back in a  week to 10 days for close follow-up.  We may have to be more aggressive  with his diuretic.  Hopefully as he loses his fluid his blood pressure  will come back under control.     Thomas C. Verl Blalock, MD, [facility]  Electronically  Signed    TCW/MedQ  DD: 07/24/2007  DT: 07/25/2007  Job #: [id number]

## 2010-11-13 NOTE — Assessment & Plan Note (Signed)
[facility]                            CARDIOLOGY OFFICE NOTE   NAME:Richard [name], Richard [name]                       MRN:          [id number]  DATE:03/11/2007                            DOB:          August 04, 1940    Richard [name] comes in today because of a reaction to benazepril.  He began  to have mouth sores about a week and a half ago, and then developed  swelling of his lips, gums, and his tongue.  He saw Dr. Pryor Ochoa, ear,  nose, and throat physician in [place], who tested him for allergies.  He stopped his benazepril.  He put him on a prednisone taper.  His  problem began to resolve slowly.   We have now listed him as intolerant or allergic to BENAZEPRIL because  of angioedema.  His blood pressure is going to need further attention,  and is, in fact, up today at 176/82.   He has 1 more day of his prednisone taper.  He looks as though he has  retained fluid, and has some swelling in his legs.   His other medications are unchanged.   His blood pressure today is 176/82, his pulse is 82 and regular.  Weight  is 257 which is unchanged.  HEENT:  Ruddy complexion.  Carotid upstrokes were equal bilaterally without bruits, no JVD, thyroid  is not enlarged, trachea is midline.  LUNGS:  Clear.  HEART:  Reveals a poorly appreciated PMI, normal S1, S2.  ABDOMINAL EXAM:  Protuberant, good bowel sounds.  EXTREMITIES:  Reveal 1+ edema.  Pulses are intact.  NEURO EXAM:  Grossly intact except he is very hyper today because of the  steroids.   ASSESSMENT AND PLAN:  1. BENAZEPRIL intolerance, secondary to development of angioedema.  2. I have increased his amlodipine to 10 mg a day from 5 mg.  We will      have him come back in 2-3 weeks, he will probably need further      adjustment and may need a diuretic.  He will continue with Toprol      XL 100 mg a day.  3. In regards to his mixed hyperlipidemia, he has really done      remarkably better with his diet.  His  total cholesterol was      recently checked and was 157, triglycerides were down from the mid      400s to 199, HDL was 33.5, LDL is down to 84.  Liver function tests      were normal.     Thomas C. Verl Blalock, MD, [facility]  Electronically Signed    TCW/MedQ  DD: 03/11/2007  DT: 03/12/2007  Job #: [id number]

## 2010-11-13 NOTE — Assessment & Plan Note (Signed)
[facility]                            CARDIOLOGY OFFICE NOTE   NAME:FORBISMylik, Wojton                       MRN:          [id number]  DATE:10/26/2007                            DOB:          01/08/1941    Mr. Radigan returns today after being evaluated for some hypertension in  the [facility] [place].  His blood pressure had shot up to 190/110.   Everything checked out well.  I have reviewed the records.   His blood pressure is now down to 139/73.  His pulse is 82 and regular,  weight is 261.   The most substantial improvement we have made is not in his blood  pressure, but also his lower extremity edema.  He does have some  dependent edema at the end of the day but that improves once he lies  down and gets up the morning.   He is having no angina, no orthopnea or PND.   He has multiple questions a day from his [employer] about wanting  to put him on a generic Statin.  He is on Lipitor and Zetia and these  have his lipids under the best control I have seen them in quite some  time.  After about a 10-minute discussion, we decided to stay with  Lipitor and Zetia.  If it gets to the point that he cannot afford it, we  will stop the Zetia and maybe just go to 80 of Lipitor.   His meds are unchanged otherwise.  He is still on the furosemide 40 mg a  day and potassium 20 mEq  a day.   PHYSICAL EXAMINATION:  VITAL SIGNS:  Blood pressure today is 139/73,  pulse 80 and regular.  His weight is 261.  HEENT:  Unchanged.  NECK:  Carotid upstrokes are equal bilaterally without bruits, no JVD.  Thyroid is not enlarged.  Trachea is midline.  LUNGS:  Clear.  HEART:  A nondisplaced PMI.  Normal S1-S2.  ABDOMEN:  Protuberant, good bowel sounds.  No midline bruit.  EXTREMITIES:  There is no edema.  Pulses are intact.  NEURO:  Exam is intact.   Mr. Fradette is doing well.  I have made no changes in his medical  program.  Will plan on seeing him back again in  August.  He will need  objective assessment of his coronaries at that time.     Thomas C. Verl Blalock, MD, [facility]  Electronically Signed   TCW/MedQ  DD: 10/26/2007  DT: 10/26/2007  Job #: [id number]

## 2010-11-13 NOTE — Assessment & Plan Note (Signed)
[place]                         GASTROENTEROLOGY OFFICE NOTE   NAME:Richard [name], Richard [name]                       MRN:          [id number]  DATE:12/01/2006                            DOB:          1940/11/08    This very nice gentleman comes in for refills of his Nexium for  primarily nauseated problems, he does well as long as he takes his  Nexium. He had a colonoscopy in 2002 and schedule another one in 5  years. Says he got a note and that is also why he came in. In review of  the chart, I felt like and indicated to him that a repeat procedure  really was not necessary for another year or 2. He is having no lower GI  symptoms. He says he does well as long he takes his Nexium, but he gets  symptomatic when he forgets it or stops it. He has had no dysphagia. His  last upper endoscopy was in 2002 as well, revealed a 4 cm hiatal hernia  and moderately severe stricture.   On examination today, he looks good. His weight was 259, blood pressure  130/74, pulse 80 and regular.  NECK, HEART, EXTREMITIES:  All basically unremarkable.   IMPRESSION:  1. Gastroesophageal reflux disease controlled relatively well with      Nexium.  2. Hyperlipidemia.  3. Arteriosclerotic cardiovascular disease post bypass.  4. Hypertension.   RECOMMENDATIONS:  Continue on his Nexium. I gave him some literature on  elevating the head of his bed and not eating prior to going to bed at  night so he does not get as much reflux. He needs to lose some weight.  He is an Social research officer, government.   MEDICATIONS:  Include; Nexium, Lipitor, Zetia, Toprol, amlodipine,  allopurinol, metformin, and Citrucel, as well as colchicine, and some  antibiotics. He said he recently had a URI that he has been treating  with some strong antibiotic. I refilled his medicine for a year and told  him that he would be seeing 1 of my colleagues in my retirement.     Clarene Reamer, MD  Electronically  Signed    SML/MedQ  DD: 12/01/2006  DT: 12/02/2006  Job #: [id number]

## 2010-11-13 NOTE — Assessment & Plan Note (Signed)
[place]                            CARDIOLOGY OFFICE NOTE   NAME:Cotroneo, JERVONTE KRAUTH                       MRN:          [id number]  DATE:06/23/2007                            DOB:          06-25-1941    Mr. Stirewalt returns today for close monitoring of his blood pressure.   During my absence he went on amlodipine 10 mg, actually increased his  amlodipine from 5 mg to 10 mg a day.  He is also on HCTZ 12.5 mg daily.  Had an angioedema reaction to benazepril.  He is also on Toprol XL 100  mg a day.   His blood pressure today is 149/81, his pulse 74 and regular, his weight  is 260, down a pound.  The rest of the exam is unchanged except that he  has 1+ pitting edema at the ankles.   He eats a banana every day and a well-balanced diet.   ASSESSMENT:  1. Essential hypertension.  2. Lower extremity edema.  3. Coronary artery disease currently stable.  4. Mixed hyperlipidemia with a good response to Lipitor back in      August.   PLAN:  1. I am going to increase HCTZ to 25 mg a day.  2. Salt restriction as much as possible.  3. Try to lose some weight.  4. See me back in 4 weeks.     Thomas C. Verl Blalock, MD, [facility]  Electronically Signed    TCW/MedQ  DD: 06/23/2007  DT: 06/23/2007  Job #: [id number]

## 2010-11-13 NOTE — Assessment & Plan Note (Signed)
[place]                            CARDIOLOGY OFFICE NOTE   NAME:Richard [name], Richard [name]                       MRN:          [id number]  DATE:08/05/2007                            DOB:          [birth year]    This is a very pleasant white male patient of Dr. Verl Blalock, who is here for  blood pressure followup.  Last week, he was switched from  hydrochlorothiazide over to Lasix for better blood pressure control and  edema.  He has had a 10-15 pound weight-gain over the past several  months and also he is weighing himself daily, has lost 4 pounds, edema  is much better and he is watching his diet closely.  He is using salt  substitute and not using the salt shaker.  Blood pressures at home with  a wrist cuff have been running 127/70, although when he went to [employer] to give blood yesterday, it was [id number] systolic.   ALLERGIES:  He had a significant reaction to BENAZEPRIL.  He had  angioedema.   CURRENT MEDICATIONS:  1. Nexium 40 mg b.i.d.  2. Lipitor 40 mg daily.  3. Zetia 10 mg daily.  4. Toprol XL 100 mg daily.  5. Allopurinol 300 mg daily.  6. Metformin 500 mg daily.  7. Citrucel 250 mg b.i.d.  8. Vitamin C 500 mg daily.  9. Aspirin 81 mg daily.  10.Colchicine 0.6 mg p.r.n.  11.Amlodipine 10 mg daily.  12.Furosemide 40 mg daily.  13.Potassium 20 mEq daily.   PHYSICAL EXAM:  This is a pleasant white male, in no acute distress.  Blood pressure 140/83, pulse 83, weight 258.  Repeat blood pressure by  myself with a large cuff was 130/80.  NECK:  Without JVD, HJR, bruit, thyroid enlargement.  LUNGS:  Clear, anterior, posterior and lateral.  HEART:  Regular rate and rhythm at 83 beats per minute, normal S1 and  S2, positive S4, no murmur, rub, bruit, thrill or heave noted.  ABDOMEN:  Soft, without organomegaly, masses, lesions or abnormal  tenderness.  EXTREMITIES:  Trace of edema, left greater than right.  Good distal  pulses.   IMPRESSION:  1.  Hypertension, better controlled.  2. Lower extremity edema, improved.  3. Coronary artery disease, stable.  4. Hyperlipidemia, treated.  5. Benazepril allergy, causing angioedema.   PLAN AT THIS TIME:  Patient is doing quite well on his current  medications.  We will not make any changes today.  We will check a BMET  to make sure his renal function and potassium are stable on the current  medications and he will follow up with Dr. Verl Blalock in three months.      Ermalinda Barrios, PA-C  Electronically Signed      Marijo Conception. Verl Blalock, MD, [facility]  Electronically Signed   ML/MedQ  DD: 08/05/2007  DT: 08/05/2007  Job #: [id number]

## 2010-11-16 NOTE — Consult Note (Signed)
[place]  Patient:    Richard [name], Richard [name]                         MRN: [id number] Proc. Date: 03/22/00 Adm. Date:  [id number] Attending:  Lorenza Evangelist CC:         Thomas C. Verl Blalock, M.D. [facility]  Darrick Penna. Linna Darner, M.D. [facility]   Consultation Report  REFERRING PHYSICIAN:  Loretha Brasil. Lia Foyer, M.D.  REASON FOR CONSULTATION:  Left main and severe three-vessel coronary disease with unstable angina.  HISTORY OF PRESENT ILLNESS:  This patient is a 70 year old gentleman who is followed by Dr. Linna Darner.  He has a history of coronary disease and underwent PTCA of the LAD in 1993 with early restenosis and redo angioplasty one week later.  He had a left circumflex angioplasty and stent placement in 1998. This was performed in a posterolateral branch.  He had a Cardiolite study done in June 1999 that was negative.  He was not seen back in follow-up but was doing well without any complaints of chest pain until the morning of March 11, 2000, when he ate breakfast and then developed some chest heaviness.  This was associated with dizziness.  He denied any shortness of breath but did have some generalized weakness.  He took some Gaviscon with some relief, but the symptoms recurred.  He was seen in the [facility] Cardiology office and had mild epigastric discomfort.  It was recommended that he be admitted to the hospital to undergo workup for possible myocardial infarction and further diagnosis and treatment of his coronary disease.  He refused admission at that time and subsequently underwent a Cardiolite scan, which was markedly abnormal.  The patient was scheduled for elective cardiac catheterization on March 20, 2000, but refused to be admitted that day and was scheduled to come in the following day for outpatient catheterization.  He subsequently developed shoulder and chest pain and dizziness similar to his prior symptoms and therefore presented to the emergency  room.  He received one sublingual nitroglycerin with relief of his pain.  He ruled out for myocardial infarction with negative CPK and troponin.  He underwent cardiac catheterization yesterday, which showed about 30% distal left main stenosis and severe three-vessel disease.  The LAD had a hazy 90% proximal stenosis before the diagonal branch.  The diagonal branch itself had an 80% long stenosis proximally.  The LAD had about 50-60% stenosis after the diagonal branch and then an 80% midvessel stenosis.  The intermediate artery had 70% proximal stenosis.  The first marginal branch had 70% proximal stenosis.  The site of the previous angioplasty in the posterolateral branch had about 50% stenosis. The right coronary artery had 90% proximal and 70% proximal stenoses and about 30-50% distal stenosis.  Left ventricular function was well-preserved.  There was no gradient across the aortic valve.  MEDICATIONS:  His medications at the time of admission are Lipitor 10 mg q.d., Altace 5 mg q.d., vitamin C 500 mg q.d., baby aspirin one q.d., lysine 500 mg q.d., Nexium 40 mg q.d.  PAST MEDICAL HISTORY:  Significant for coronary disease as mentioned above. He has a history of hypertension and hypercholesterolemia.  He is status post appendectomy.  He is status post leg fracture in the past.  He has a history of gastroesophageal reflux disease and hiatal hernia and has had esophageal dilatation for stricture.  REVIEW OF SYSTEMS:  CONSTITUTIONAL:  He denies any fevers or chills.  He has had no weight loss or weight gain.  HEENT:  Eyes:  Negative.  ENT:  Negative. ENDOCRINE:  He denies diabetes or thyroid disease.  CARDIOVASCULAR:  As above. He has had no palpitations.  He denied PND or orthopnea and has had no lower extremity edema.  RESPIRATORY:  He denies cough or sputum production. GASTROINTESTINAL:  He has no dysphagia but does have gastroesophageal reflux symptoms.  He denies any history of  peptic ulcer disease.  He has had no melena or hematochezia.  GENITOURINARY:  He denies dysuria or hematuria. NEUROLOGIC:  He denies any history of TIA or stroke.  He has had no prior dizziness or syncope.  Dizziness at this time is associated with his chest pain.  PSYCHIATRIC:  Negative.  HEMATOLOGIC:  He denies any history of bleeding disorders or easy bleeding.  ALLERGIES:  ASPIRIN causes stomach upset, but he does tolerate baby aspirin.  SOCIAL HISTORY:  Significant for being single.  He was previously married for 18 years.  He has two children, who live in [place].  He works as a Hotel manager for [employer].  He does not smoke and does not use alcohol.  FAMILY HISTORY:  His mother died of pancreatic cancer at age 25 and his father died of a CVA at age 28.  He has two brothers and one sister, none of whom have coronary disease.  PHYSICAL EXAMINATION:  VITAL SIGNS:  His blood pressure is 141/74, and his pulse is 60 and regular. Respiratory rate is 16 and nonlabored.  GENERAL:  He is a well-developed white male in no distress.  HEENT:  Normocephalic, atraumatic.  The pupils are equal and reactive to light and accommodation.  Extraocular muscles are intact.  His throat is clear.  NECK:  Normal carotid pulses bilaterally.  There are no bruits.  There is no adenopathy or thyromegaly.  CARDIAC:  Regular rate and rhythm with normal sounds.  There is no murmur or rub, or gallop.  LUNGS:  Clear.  ABDOMEN:  Active bowel sounds.  The abdomen is soft, mildly obese, and nontender.  There are no masses and no hepatosplenomegaly.  EXTREMITIES:  No peripheral edema.  Pedal pulses are strong and palpable bilaterally.  SKIN:  Warm and dry.  NEUROLOGIC:  Alert and oriented x 3.  Motor and sensory exams are grossly normal.  LABORATORY DATA:  Normal electrolytes and a normal creatinine of 1.0. Coagulation profile is normal.  His hematocrit is 40.  Electrocardiogram  shows  normal sinus rhythm with no acute changes.  IMPRESSION:  In summary, Mr. Kilbarger has mild left main and severe three-vessel coronary disease with unstable anginal symptoms.  I agree that coronary artery bypass graft surgery is the best treatment for this young, active patient.  I have discussed the operative procedure with him, including alternatives, benefits, and risks, including bleeding, possible blood transfusion, infection, stroke, myocardial infarction, and death.  He understands and agrees to proceed with surgery.  I will review the operative schedule and decide when he can get done as soon as possible.  We will plan to do carotid Dopplers at admission on Monday morning. DD:  03/22/00 TD:  03/24/00 Job: [id number]

## 2010-11-16 NOTE — Assessment & Plan Note (Signed)
[facility]                              CARDIOLOGY OFFICE NOTE   NAME:Richard [name], Richard [name]                       MRN:          [id number]  DATE:01/29/2006                            DOB:          [birth year]   Richard [name] returns today for further management of the following issues:  1.  Coronary artery disease, status post coronary artery bypass grafting.      He is having no symptoms of ischemia, but is due a stress Myoview.  2.  Mixed hyperlipidemia.  His lipids are at goal, except for a low HDL by      Dr. Linna Darner on 01/21/06.  He is on Lipitor 40 mg a day.  His      triglycerides have remained normal with his significant weight loss of      20 pounds, which he has maintained.  3.  Hypertension.  His blood pressures have been under good control, but he      ran out of his medicines before he came today.  He is high obviously      today.   MEDICATIONS:  1.  Toprol XL 100 mg a day.  2.  Nexium 40 mg b.i.d.  3.  Baby aspirin daily.  4.  Vitamin C.  5.  Clarinex.  6.  Zetia 10 mg a day.  7.  Lipitor 40 mg a day.  8.  Benazepril 40 mg a day.   PHYSICAL EXAMINATION:  VITAL SIGNS:  His blood pressure today is 169/96, his  pulse is 84 and regular.  He weighs 250.  NECK:  Carotids are full without bruits.  There is no JVD.  The thyroid is  not enlarged.  Trachea is midline.  LUNGS:  Clear.  HEART:  Reveals a regular rate and rhythm.  His sternotomy site is stable.  ABDOMEN:  Exam is soft, with good bowel sounds.  EXTREMITIES:  Reveal no cyanosis, clubbing or edema.  Pulses are brisk.   His EKG is normal, except for first degree A-V block.   ASSESSMENT AND PLAN:  Richard [name] is doing well.  We renewed his benazepril  and Toprol.  We have arranged for him to have an exercise rest/stress  Myoview.  Assuming this is negative, we will see him back in a year.                               Thomas C. Verl Blalock, MD, [facility]   TCW/MedQ  DD:  01/29/2006  DT:   01/30/2006  Job #:  [id number]   cc:   Darrick Penna. Linna Darner, MD, FCCP

## 2010-11-16 NOTE — Discharge Summary (Signed)
[place]  Patient:    Richard [name], Richard [name]                         MRN: [id number] Adm. Date:  [id number] Disc. Date: 03/30/00 Attending:  Valla Leaver Dictator:   Earnstine Regal, P.A. CC:         Marijo Conception. Verl Blalock, M.D. [facility]  Darrick Penna. Linna Darner, M.D. [facility]   Discharge Summary  ADMISSION DIAGNOSES: 1. Unstable angina with prior PTCA interventions. 2. Hypertension. 3. Hyperlipidemia. 4. Gastroesophageal reflux disease.  DISCHARGE DIAGNOSES: 1. A 30% left main, 90% left anterior descending, 70% circumflex, 90%    right coronary artery stenosis with a normal ejection fraction. 2. Hypertension. 3. Hyperlipidemia. 4. Gastroesophageal reflux disease.  PROCEDURES: 1. Cardiac catheterization March 21, 2000. 2. Coronary artery bypass grafting x5 with right internal mammary artery to    the RCA, left  internal mammary to the LAD, saphenous vein graft to the   first and second obtuse marginals and saphenous vein graft to the diagonal    March 26, 2000, Dr. Cyndia Bent.  BRIEF HISTORY:  The patient is a 70 year old white male a medical patient of Dr. Unice Cobble and Dr. Mar Daring with a history of angioplasty 9-10 years ago, PTCA stent three years ago, who recently began having recurrent chest pain.  He was in the [place] office last week for evaluation of chest pain and had a Cardiolite which was abnormal.  He was referred at this time for elective cardiac cardiac catheterization.  This was scheduled for March 21, 2000, but he developed shoulder and chest pain, dizziness and presented to the emergency room on September 72, 2001.  He was started on nitroglycerin and admitted at that point.  PAST MEDICAL HISTORY:  Positive for hypertension he was recently on Pravachol,  Lipitor both.  Status post appendectomy with a history of a hiatal hernia, gastroesophageal reflux disease and status post esophageal dilatations.  ALLERGIES:  He is allergic  to aspirin - upset stomach though please note he has been receiving aspirin here and has tolerated it well.  MEDICATIONS ON ADMISSION: 1. Zyrtec 10 mg q.d. 2. Nexium 40 mg q.d. 3. Baby aspirin one q.d. 4. L-lysine 500 mg q.d. 5. Lipitor 10 mg q.d. 6. Altace 5 mg q.d. 7. Vitamin C 500 mg q.d. 8. Citrucel two per day.  HABITS:  He does not have a history of alcohol use and no history of tobacco use.  For further history and physical please see the dictated note.  HOSPITAL COURSE:  The patient was admitted he was stabilized and taken to the cath lab on March 21, 2000.  There he appeared to have a 90% right coronary artery stenosis followed up by a 70% stenosis.  The left main appeared to be 30% stenotic.  There was 90% LAD and a 70% OM and a 50% distal circumflex.  It looks like the distal circumflex was the site of a prior PTCA. After reviewing the studies it was their opinion the patient would benefit from coronary artery bypass grafting.  The risks and benefits were discussed. He was seen in consultation by Dr. Cyndia Bent.  It was his impression that the patient had a markedly abnormal Cardiolite.  Cath showed a 30% distal left main and a 90% proximal LAD and 80% diagonal, 80% mid LAD, 70% OM, 50% posterior lateral at the stent site.  RCA was 90% and a proximal 70% lesion and a more  distal 30-50% stenosis.  The ejection fraction was preserved.  Dr. Cyndia Bent agreed and plans were made to proceed with coronary artery bypass grafting.  The patient remained stable and was taken to the operating room on March 20, 2000 at which time he underwent coronary artery bypass grafting as described above.  This was done using cardiopulmonary bypass, potassium, cardioplegia and profound myocardial hypothermia.  He tolerated the procedure well and returned to the intensive care unit in satisfactory condition.  He remained hemodynamically stable overnight with a mild sinus tachycardia  but was  extubated and did well.  The first postoperative morning blood pressure was 98/55, pulse 96, PA was 24/15, cardiac index was 3.37.  Overall he was doing well and his mediastinal tubes were removed and he was mobilized.  He was mobilized and transferred to the floor and made good progress.  The chest tube was removed on the second postoperative day.  The patient continued to make slow and steady progress. He was started in cardiac rehab and has continued to do well.  On March 29, 2000, he was seen by Dr. Servando Snare.  The patients blood pressure was stable in sinus rhythm, O2 saturation were 93% on room air.  His wounds looked good and it was Dr. Carrie Mew opinion if he continued to do well he can go home in the AM, March 30, 2000.  DISCHARGE MEDICATIONS: 1. Decadron 0.25 mg one q.d. 2. Imdur 10 mg q.d. 3. Nexium 40 mg q.d. 4. Lasix 40 mg q.d x7. 5. Potassium chloride 20 mEq q.d. x7. 6. Altace 5 mg q.d. 7. Tylox 1-2 p.o. q4h p.r.n.  DISCHARGE INSTRUCTIONS:  The patient will return in two weeks to see Dr. Verl Blalock and in three weeks to see Dr. Cyndia Bent.  Chest x-ray for Dr. Yolanda Bonine office.  DISCHARGE LABS:  White count is 9.1, hemoglobin 10.1, hematocrit 29.5, platelets 229,000.  Electrolytes are normal with BUN 12 and creatinine 0.9.  CONDITION ON DISCHARGE:  Improved. DD:  03/29/00 TD:  03/29/00 Job: [id number]

## 2010-11-16 NOTE — Cardiovascular Report (Signed)
[place]  Patient:    Richard [name], Richard [name]                         MRN: [id number] Proc. Date: 03/21/00 Adm. Date:  [id number] Attending:  Lorenza Evangelist CC:         Thomas C. Verl Blalock, M.D. [facility]  Darrick Penna. Linna Darner, M.D. [facility]  CV Laboratory   Cardiac Catheterization  INDICATIONS:  [name] is a delightful 70 year old who has had a history of exertional angina.  He has an abnormal Cardiolite.  He has had previous intervention to the LAD and also to the distal circumflex.  The current study was done to access coronary anatomy.  PROCEDURES: 1. Left heart catheterization. 2. Selective coronary arteriography. 3. Selective left ventriculography. 4. Subclavian angiography. 5. Perclose placement.  DESCRIPTION OF PROCEDURE:  The procedure was performed from the right femoral artery using 6 French catheters.  He tolerated the procedure without complication.  Sterile technique was used throughout with gowns, gloves, hats, and masks.  We reapplied and reprepped the groin for a Perclose placement and changed gowns and gloves.  The Perclose device was placed without difficulty and hemostasis was achieved by direct closure.  There were no complications.  HEMODYNAMICS:  The central aortic pressure was 144/94.  LV pressure 145/20. There was no gradient on pullback across the aortic valve.  ANGIOGRAPHIC DATA:  The left main coronary artery tapered in its distal most aspect and had about 30% distal narrowing.  The proximal LAD also had about 30% proximal narrowing.  The LAD as noted had proximal 30% narrowing.  There is a 90% hazy occlusion just prior to the origin of the diagonal.  The diagonal itself has an 80% area of long segmental plaquing.  Just beyond the diagonal is segmental disease of about 50-60%.  More distally there is a segmental area of 80% narrowing.  The distal LAD is suitable for grafting.  The diagonal was also suitable for  grafting.  There is a first marginal branch that has about 70% proximal narrowing.  The distal vessel is suitable for grafting.  The second marginal branch also has about 70% proximal narrowing.  The distal vessel is also suitable for grafting.  The AV circumflex has about a 50% narrowing at the previous stent site.  This is the distal circumflex.  The right coronary artery has a new stenosis of 90% near the junction of the proximal and mid vessel.  There is a 70% stenosis in the proximal portion of the mid vessel.  Beyond the large acute marginal branch there is about a 30-50% area of segmental plaquing distally.  The subclavian is widely patent.  LEFT VENTRICULOGRAPHY:  Ventriculography in the RAO projection reveals preserved global systolic function.  Because of ventricular ectopy the ejection fraction could not be calculated, but on a post-PVC beat was an in excess of 60%.  CONCLUSIONS: 1. Normal left ventricular function. 2. Patent subclavian. 3. Progression of disease involving both the left anterior descending and    right coronary arteries.  DISPOSITION:  We have discussed the various options.  I have reviewed this with Dr. Vicenta Aly.  The LAD has a fair amount of segmental disease between the tight stenosis, the distal stenosis with plaquing crossing the diagonal. The diagonal itself is diseased.  The right is clearly diseased.  We will discuss options but my leaning would be in the direction of revascularization surgery.  I plan to get  a surgical consult if the patient is agreeable to this.  There is evidence of collateralization to the distal LAD from the right coronary artery. DD:  03/21/00 TD:  03/21/00 Job: [id number]

## 2010-11-16 NOTE — Op Note (Signed)
[place]  Patient:    Richard [name], Richard [name]                         MRN: [id number] Proc. Date: 03/26/00 Adm. Date:  [id number] Attending:  Valla Leaver CC:         Gaye Pollack, M.D.             Thomas C. Wall, M.D. [facility]             Cath lab                           Operative Report  PREOPERATIVE DIAGNOSIS:  Severe three-vessel coronary artery disease with unstable angina.  POSTOPERATIVE DIAGNOSIS:  Severe three-vessel coronary artery disease with unstable angina  OPERATION PERFORMED:  Median sternotomy, extracorporeal circulation, coronary artery bypass graft surgery x 5 using a left internal mammary artery graft to the left anterior descending coronary artery, with a right internal mammary artery graft to the right coronary artery, a saphenous vein graft to the diagonal branch of the left anterior descending, and a sequential saphenous vein graft to the first and second obtuse marginal branches of the left circumflex coronary artery.  SURGEON:  Gaye Pollack, M.D.  ASSISTANT:  Jadene Pierini, PA-C.  ANESTHESIA:  General endotracheal.  INDICATIONS FOR PROCEDURE:  The patient is a 70 year old gentleman with a history of coronary artery disease who underwent percutaneous transluminal coronary angioplasty of the left anterior descending in 1993 with early restenosis and required a redo angioplasty one week later.  He had a left circumflex angioplasty and stent in 1998.  This was performed to a small posterolateral branch.  He had a Cardiolite study done in June of 1999 that was negative.  He was not seen back in follow-up but was doing well without any complaints of chest pain until the morning of March 11, 2000.  He developed unstable angina at this time and subsequently underwent a Cardiolite scan which was markedly abnormal.  He underwent elective cardiac catheterization on March 20, 2000.  This showed about 30% distal left  main stenosis and severe three-vessel disease.  The LAD had a hazy 90% proximal stenosis before the diagonal branch.  The diagonal branch itself had a long 80% stenosis proximally.  The LAD had 50 to 60% stenosis after the diagonal branch and then about 80% midvessel stenosis.  The intermediate artery had 70% proximal stenosis.  The first marginal branch had 70% proximal stenosis.  The site of the previous angioplasty in the posterolateral branch had about 50% stenosis but this was a small vessel.  The right coronary artery had 90% proximal and 70% proximal stenoses and about 30 to 50% distal stenosis.  Left ventricular function was well preserved.  There was no gradient across the aortic valve.  After review of the angiograms and examination of the patient it was felt that coronary artery bypass surgery was the best treatment.  I discussed the operative procedure with the patient including alternatives to surgery, benefits, and risks including bleeding, possible blood transfusion, infection, stroke, myocardial infarction, and death.  He  understood and agreed to proceed with surgery.  DESCRIPTION OF PROCEDURE:  The patient was taken to the operating room and placed on the table in supine position.  After induction of general endotracheal anesthesia, a Foley catheter was placed in the bladder using sterile technique.  Then the chest, abdomen  and both lower extremities were prepped and draped in the usual sterile manner.  The chest was entered through a median sternotomy incision and the pericardium opened in the midline. Examination of the heart showed good ventricular contractility.  The ascending aorta had no palpable plaques in it.  Then the left internal mammary artery was harvested from the chest wall as a pedicle graft.  This was a large caliber vessel with excellent blood flow through it.  At the same time a segment of saphenous vein was harvested from the right lower leg.  This  vein started out as a large caliber vein with sclerotic wall that was very thick.  After a few inches this vein decreased in size to a medium-sized vessel but the wall remained thickened and sclerotic. Then the vein divided and became quite small and unsuitable.  There was enough vein here to do the diagonal branch.  Then we examined the saphenous vein in the left lower leg and this vein was likewise very thick and sclerotic and was not felt to be suitable.  We therefore obtained another section of vein from the right thigh and this vein was of medium to large caliber but much better quality with only mild wall thickening.  At this point I decided that it would be best to use the right internal mammary artery graft to the right coronary artery since the vein was limited.  This right internal mammary artery was then harvested as a pedicle graft.  Then the patient was heparinized and when an adequate activated clotting time was achieved, the distal ascending aorta was cannulated using a 22 French aortic cannula for arterial inflow.  Venous outflow was achieved using a two-stage venous cannula through the right atrial appendage.  An antegrade cardioplegia and vent cannula was inserted into the aortic root.  The patient was placed on cardiopulmonary bypass and the distal coronary arteries were identified.  The LAD was a large vessel that was diffusely diseased in its proximal and midportions.  There was sparse distal plaque. The diagonal branch was heavily diseased in its proximal portion.  The first and second marginal branches were both intramyocardial.  They became epicardial only for a brief period proximally and were heavily diseased here. They therefore were dissected distally in the muscle where they were soft and free of disease.  The distal left circumflex or posterolateral branch was small nongraftable vessel.  Then the aorta was cross-clamped and 500 cc of cold blood  antegrade cardioplegia was administered in the aortic root with quick arrest of the  heart.  Systemic hypothermia to 20 degrees centigrade and topical hypothermia with iced saline was used.  A temperature probe was placed in the septum and an insulating pad in the pericardium.  The first distal anastomosis was performed to the first marginal or intermediate artery.  The internal diameter was about 1.75 mm.  The conduit used was a segment of greater saphenous vein.  Anastomosis was performed in end-to-side manner using continuous 7-0 Prolene suture.  The flow was measured through the graft and was excellent.  The second distal anastomosis was performed to the second marginal branch. The internal diameter was 1.75 mm.  The conduit used was the same segment of saphenous vein and anastomosis performed in a sequential end-to-side manner using continuous 7-0 Prolene suture.  The flow was again measured through the graft and was excellent.  Then another dose of cardioplegia was given down the vein graft and into the aortic root.  The third distal anastomosis was performed to the diagonal branch.  The internal diameter in this vessel was about 1.5 mm.  The conduit used was a segment of greater saphenous vein and this anastomosis was performed in an end-to-side manner using continuous 8-0 Prolene suture.  The flow was measured through the graft and was good. The vein to the diagonal branch was thick adn fibrotic.  Then the fourth distal anastomosis was performed to the largest branch of the right coronary artery as it coursed over the acute margin of the heart.  The internal diameter was 1.75 mm.  The conduit used was the right internal mammary artery and this was brought through an opening in the right pericardium anterior to the phrenic nerve.  It was anastomosed to the right coronary artery in end-to-side manner using continuous 8-0 Prolene suture. The pedicle was tacked to the epicardium  with 6-0 Prolene sutures.  The anastomosis appeared hemostatic.  Then another dose of cardioplegia was given.  The fifth distal anastomosis was performed to the distal portion of the left anterior descending coronary artery.  The internal diameter was about 1.75 mm. The conduit used was the left internal mammary artery and this was brought through an opening in the left pericardium anterior to the phrenic nerve.  It was anastomosed to the LAD in end-to-side manner using continuous 8-0 Prolene suture.  The pedicle was tacked to the epicardium with 6-0 Prolene sutures. The patient was rewarmed to 37 degrees and the clamp removed from the mammary pedicle.  There was rapid warming of the ventricular septum and return of spontaneous ventricular fibrillation.  The crossclamp was removed with a time of 57 minutes and the patient defibrillated into sinus rhythm.  A partial occlusion clamp was placed on the aortic root and the two proximal vein graft anastomoses were performed in end-to-side manner using continuous 6-0 Prolene suture.  The clamps were removed, the vein grafts deaired and the clamps removed from them.  The proximal and distal anastomoses appeared hemostatic and the line of the grafts satisfactory.  Graft markers were placed around the proximal anastomoses.  Two temporary right ventricular and right atrial pacing wires were placed and brought out through the skin.  When the patient had rewarmed to 37 degrees centigrade, he was weaned from cardiopulmonary bypass on no inotropic agents.  Total bypass time was 114 minutes.  Cardiac function appeared excellent with a cardiac output of 5L per minute.  Protamine was given and the venous and aortic cannulae were removed without difficulty.  Hemostasis was achieved.  Four chest tubes were placed with bilateral pleural tubes, one in the posterior pericardium and one in the anterior mediastinum.  The pericardium was reapproximated over the  heart.  The sternum was closed with #6 stainless steel wires.  The fascia was closed with continuous #1 Vicryl suture.  The subcutaneous tissues were closed using continous 2-0 Vicryl and the skin with 3-0 Vicryl subcuticular closure.  The lower extremity vein harvest sites were closed in layers in a similar manner. The sponge, needle and instrument counts were correct according to the scrub nurse.  A dry sterile dressing was applied over the incisions and around the chest tubes which were hooked to Pleur-Evac suction.  The patient remained hemodynamically stable and was transported to the SICU in guarded but stable condition. DD:  03/26/00 TD:  03/27/00 Job: [id number]

## 2010-11-19 ENCOUNTER — Other Ambulatory Visit (INDEPENDENT_AMBULATORY_CARE_PROVIDER_SITE_OTHER): Payer: Medicare Other | Admitting: *Deleted

## 2010-11-19 DIAGNOSIS — I25810 Atherosclerosis of coronary artery bypass graft(s) without angina pectoris: Secondary | ICD-10-CM

## 2010-11-19 DIAGNOSIS — E785 Hyperlipidemia, unspecified: Secondary | ICD-10-CM

## 2010-11-19 LAB — BASIC METABOLIC PANEL
BUN: 33 mg/dL — ABNORMAL HIGH (ref 6–23)
CO2: 25 mEq/L (ref 19–32)
Glucose, Bld: 123 mg/dL — ABNORMAL HIGH (ref 70–99)
Potassium: 4.6 mEq/L (ref 3.5–5.1)
Sodium: 136 mEq/L (ref 135–145)

## 2010-11-19 LAB — LIPID PANEL: HDL: 32 mg/dL — ABNORMAL LOW (ref >39.00)

## 2010-11-19 LAB — HEPATIC FUNCTION PANEL
ALT: 22 U/L (ref 0–53)
Albumin: 3.9 g/dL (ref 3.5–5.2)
Alkaline Phosphatase: 110 U/L (ref 39–117)
Total Protein: 6.5 g/dL (ref 6.0–8.3)

## 2010-11-23 ENCOUNTER — Telehealth: Payer: Self-pay | Admitting: Cardiology

## 2010-11-23 NOTE — Telephone Encounter (Signed)
Pt states someone call him re blood work results. Pt is returning the call

## 2010-11-23 NOTE — Telephone Encounter (Signed)
Pt aware of test results. Horton Chin RN

## 2010-12-04 ENCOUNTER — Encounter: Payer: Self-pay | Admitting: Internal Medicine

## 2010-12-04 ENCOUNTER — Ambulatory Visit (INDEPENDENT_AMBULATORY_CARE_PROVIDER_SITE_OTHER): Payer: Medicare Other | Admitting: Internal Medicine

## 2010-12-04 ENCOUNTER — Other Ambulatory Visit: Payer: Self-pay | Admitting: Internal Medicine

## 2010-12-04 DIAGNOSIS — J329 Chronic sinusitis, unspecified: Secondary | ICD-10-CM

## 2010-12-04 DIAGNOSIS — E119 Type 2 diabetes mellitus without complications: Secondary | ICD-10-CM

## 2010-12-04 DIAGNOSIS — G4730 Sleep apnea, unspecified: Secondary | ICD-10-CM

## 2010-12-04 DIAGNOSIS — J449 Chronic obstructive pulmonary disease, unspecified: Secondary | ICD-10-CM

## 2010-12-04 LAB — CBC WITH DIFFERENTIAL/PLATELET
Basophils Relative: 0.3 % (ref 0.0–3.0)
Eosinophils Absolute: 0.1 10*3/uL (ref 0.0–0.7)
HCT: 39.5 % (ref 39.0–52.0)
Hemoglobin: 13 g/dL (ref 13.0–17.0)
Lymphs Abs: 1.8 10*3/uL (ref 0.7–4.0)
MCHC: 32.8 g/dL (ref 30.0–36.0)
MCV: 80.4 fl (ref 78.0–100.0)
Monocytes Absolute: 0.8 10*3/uL (ref 0.1–1.0)
Neutro Abs: 6 10*3/uL (ref 1.4–7.7)
RBC: 4.92 Mil/uL (ref 4.22–5.81)
RDW: 20 % — ABNORMAL HIGH (ref 11.5–14.6)

## 2010-12-04 MED ORDER — METRONIDAZOLE 500 MG PO TABS: 500.0000 mg | ORAL_TABLET | Freq: Three times a day (TID) | ORAL | Status: AC

## 2010-12-04 NOTE — Patient Instructions (Signed)
Force non dairy fluids & take Plain Mucinex to thin secretions.

## 2010-12-04 NOTE — Progress Notes (Signed)
Addended byHendricks Limes on: 12/04/2010 08:48 AM   Modules accepted: Orders

## 2010-12-04 NOTE — Progress Notes (Signed)
  Subjective:    Patient ID: Richard [name], male    DOB: [birth year], 70 y.o.   MRN: [id number]  HPI Sinusitis Onset/symptoms:onset > 6 weeks ago  Exposures (illness/environmental/extrinsic):no; PMH of chronic sinusitis Progression of symptoms:no change with sulfa; increased purulent secretions & some dark mucus. R  nare > L  secretion production. Treatments/response:S/P sulfa X 2 weeks , initially Augmentin X 10 days  Present symptoms:marked congestion in sinuses Fever/chills/sweats:no Frontal headache:no Facial pain: no Nasal purulence:mostly yellow green Sore throat:no Dental pain:no Lymphadenopathy:no Wheezing/shortness of breath:no; Nebulizer controls RAD symptoms Cough/sputum/hemoptysis:minimal sputum Pleuritic pain:no Associated extrinsic/allergic symptoms:itchy eyes/ sneezing:minimal on Allegra Past medical history: Seasonal allergies: +; asthma:no Smoking history:never   He has nasal obstruction unless he uses Neti pot & intermittent anosmia. He denies fatigue, halitosis,earache .  Last imaging 3 years ago;sinus surgery by Dr Redmond Baseman.         Review of Systems Sleep Apnea well controlled with CPAP. FBS 84-105.     Objective:   Physical Exam General appearance is of good health and nourishment; no acute distress or increased work of breathing is present.  No  lymphadenopathy about the head, neck, or axilla noted.   Eyes: No conjunctival inflammation or lid edema is present. There is no scleral icterus. EOMI ; normal infection  Ears:  External ear exam shows no significant lesions or deformities.  Otoscopic examination reveals clear canals, tympanic membranes are intact bilaterally without bulging, retraction, inflammation or discharge.  Nose:  External nasal examination shows no deformity or inflammation. Nasal mucosa are pink and moist without lesions or exudates. No septal dislocation or dislocation.No obstruction to airflow.   Oral exam: Dental hygiene is good;  lips and gums are healthy appearing.There is no oropharyngeal erythema or exudate noted. Uppper partial  Neck:  No deformities, thyromegaly, masses, or tenderness noted.   Supple with full range of motion without pain.   Heart:  Normal rate and regular rhythm. S1 and S2 normal without gallop, murmur, click, rub or other extra sounds.   Lungs:Chest clear to auscultation; no wheezes, rhonchi,rales ,or rubs present.No increased work of breathing.    Extremities:  No cyanosis, edema, or clubbing  noted    Skin: Warm & dry w/o jaundice or tenting.          Assessment & Plan:  #1 chronic rhinosinusistis ; R/O anatomicall obstruction; R/O systemic process ( Ex. Wegener's); R/O non bacterial (Ex fungal) process Plan: #61metrondazole 500 mg tid #2 sinus CT  & chest Xray #3 ENT consult

## 2010-12-05 ENCOUNTER — Ambulatory Visit (INDEPENDENT_AMBULATORY_CARE_PROVIDER_SITE_OTHER)
Admission: RE | Admit: 2010-12-05 | Discharge: 2010-12-05 | Disposition: A | Discharge status: Home / Self Care | Payer: Medicare Other | Source: Ambulatory Visit | Attending: Internal Medicine | Admitting: Internal Medicine

## 2010-12-05 DIAGNOSIS — J329 Chronic sinusitis, unspecified: Secondary | ICD-10-CM

## 2011-03-22 ENCOUNTER — Ambulatory Visit (INDEPENDENT_AMBULATORY_CARE_PROVIDER_SITE_OTHER): Payer: Medicare Other | Admitting: Internal Medicine

## 2011-03-22 ENCOUNTER — Encounter: Payer: Self-pay | Admitting: Internal Medicine

## 2011-03-22 DIAGNOSIS — J209 Acute bronchitis, unspecified: Secondary | ICD-10-CM

## 2011-03-22 DIAGNOSIS — R110 Nausea: Secondary | ICD-10-CM

## 2011-03-22 DIAGNOSIS — R197 Diarrhea, unspecified: Secondary | ICD-10-CM

## 2011-03-22 MED ORDER — DOXYCYCLINE HYCLATE 100 MG PO TABS: 100.0000 mg | ORAL_TABLET | Freq: Two times a day (BID) | ORAL | Status: AC

## 2011-03-22 NOTE — Patient Instructions (Signed)
Stay on clear liquids for 48-72 hours or until bowels are normal.This would include  jello, sherbert (NOT ice cream), Lipton's chicken noodle soup(NOT cream based soups),Gatorade Lite, flat Ginger ale (without High Fructose Corn Syrup),dry toast or crackers, baked potato.No milk , dairy or grease until bowels are formed.  Align , a [employer] , daily if stools are loose. Immodium AD for frankly watery stool. Report increasing pain, fever or rectal bleeding Please take the probiotic , Align, every day until the bowels are normal. This will replace the normal bacteria which  are necessary for formation of normal stool and processing of food.

## 2011-03-22 NOTE — Progress Notes (Signed)
  Subjective:    Patient ID: Richard [name], male    DOB: Sep 03, 1940, 70 y.o.   MRN: [id number]  HPI Respiratory tract infection Onset/symptoms:aching, malaise, yellow sputum 9/20 Exposures (illness/environmental/extrinsic):no but in schools Progression of symptoms:nausea & diarrhea today Treatments/response:Immodium AD Present symptoms: Fever/chills/sweats:chills only Frontal headache:no Facial pain:no Nasal purulence:no Sore throat:no Dental pain:no Lymphadenopathy:no Wheezing/shortness of breath:no Cough/sputum/hemoptysis:yellow phlegm  Pleuritic pain:no Associated extrinsic/allergic symptoms:itchy eyes/ sneezing:no            Review of Systems     Objective:   Physical Exam General appearance is one of good health and nourishment w/o distress.  Eyes: No conjunctival inflammation or scleral icterus is present.EOMI & vision normal with lenses  Oral exam: Dental hygiene is good; lips and gums are healthy appearing.There is minimal  oropharyngeal erythema w/o exudate noted.   Heart:  Normal rate and regular rhythm. S1 and S2 normal without gallop, murmur, click, rub or other extra sounds     Lungs:Chest clear to auscultation; no wheezes, rhonchi,rales ,or rubs present.No increased work of breathing. Decreased BS  Abdomen: bowel sounds normal, soft and non-tender without masses, organomegaly or hernias noted.  No guarding or rebound   Skin:Warm & dry.  Intact without suspicious lesions or rashes ; no jaundice   Lymphatic: No lymphadenopathy is noted about the head, neck, axilla            Assessment & Plan:  #1 bronchitis with purulent sputum; no suggestion of rhinosinusitis  #2 nausea and diarrhea  Plan: See orders and recommendations

## 2011-03-25 LAB — COMPREHENSIVE METABOLIC PANEL
AST: 22
CO2: 24
Calcium: 9
Chloride: 100
Creatinine, Ser: 1.07
GFR calc Af Amer: >60
GFR calc non Af Amer: >60
Glucose, Bld: 121 — ABNORMAL HIGH
Total Bilirubin: 0.8

## 2011-03-25 LAB — DIFFERENTIAL
Basophils Absolute: 0
Eosinophils Absolute: 0.1
Eosinophils Relative: 2
Lymphocytes Relative: 24
Neutrophils Relative %: 59

## 2011-03-25 LAB — CBC
HCT: 43.3
Hemoglobin: 14
MCHC: 32.4
MCV: 81.5
RBC: 5.31
WBC: 5.1

## 2011-03-25 LAB — LIPASE, BLOOD: Lipase: 14

## 2011-03-26 LAB — POCT CARDIAC MARKERS
CKMB, poc: 3.4
Myoglobin, poc: 195
Operator id: 294521
Troponin i, poc: <0.05

## 2011-03-26 LAB — CBC
MCHC: 32.7
RBC: 4.96
RDW: 16.1 — ABNORMAL HIGH

## 2011-03-26 LAB — DIFFERENTIAL
Basophils Absolute: 0
Basophils Relative: 1
Lymphocytes Relative: 23
Neutro Abs: 5.7
Neutrophils Relative %: 66

## 2011-03-26 LAB — B-NATRIURETIC PEPTIDE (CONVERTED LAB): Pro B Natriuretic peptide (BNP): <30

## 2011-03-26 LAB — POCT I-STAT, CHEM 8
Chloride: 106
HCT: 41
Potassium: 3.9

## 2011-04-03 ENCOUNTER — Other Ambulatory Visit: Payer: Self-pay | Admitting: Internal Medicine

## 2011-04-16 ENCOUNTER — Encounter: Payer: Self-pay | Admitting: Cardiology

## 2011-04-16 ENCOUNTER — Ambulatory Visit (INDEPENDENT_AMBULATORY_CARE_PROVIDER_SITE_OTHER): Payer: Medicare Other | Admitting: Cardiology

## 2011-04-16 VITALS — BP 140/74 | HR 78 | Ht 72.5 in | Wt 272.0 lb

## 2011-04-16 DIAGNOSIS — E782 Mixed hyperlipidemia: Secondary | ICD-10-CM

## 2011-04-16 DIAGNOSIS — R609 Edema, unspecified: Secondary | ICD-10-CM

## 2011-04-16 DIAGNOSIS — I25810 Atherosclerosis of coronary artery bypass graft(s) without angina pectoris: Secondary | ICD-10-CM

## 2011-04-16 DIAGNOSIS — I10 Essential (primary) hypertension: Secondary | ICD-10-CM

## 2011-04-16 MED ORDER — FUROSEMIDE 40 MG PO TABS: 40.0000 mg | ORAL_TABLET | ORAL | Status: DC

## 2011-04-16 MED ORDER — ATORVASTATIN CALCIUM 80 MG PO TABS: 80.0000 mg | ORAL_TABLET | Freq: Every day | ORAL | Status: DC

## 2011-04-16 MED ORDER — POTASSIUM CHLORIDE CRYS ER 20 MEQ PO TBCR: 20.0000 meq | EXTENDED_RELEASE_TABLET | ORAL | Status: DC

## 2011-04-16 MED ORDER — AMLODIPINE BESYLATE 5 MG PO TABS: 5.0000 mg | ORAL_TABLET | Freq: Every day | ORAL | Status: DC

## 2011-04-16 MED ORDER — METOPROLOL SUCCINATE ER 100 MG PO TB24: 100.0000 mg | ORAL_TABLET | Freq: Every day | ORAL | Status: DC

## 2011-04-16 NOTE — Assessment & Plan Note (Signed)
Improved. The change in treatment.

## 2011-04-16 NOTE — Assessment & Plan Note (Signed)
He would like to come off of the Zetia since is not generic. Again his labs, I think we could double his atorvastatin 80 mg a day with similar results. I've advised him to lose weight and watch carbohydrates. His triglycerides continue to be a problem. We'll check blood work again in 3 months.

## 2011-04-16 NOTE — Assessment & Plan Note (Signed)
Stable. No change in treatment. 

## 2011-04-16 NOTE — Progress Notes (Signed)
HPI Richard [name] Returns today for evaluation and management of his coronary disease coming to bypass surgery, hypertension, lower extremity edema, mixed hyperlipidemia.  He has had his right rotator cuff repaired. His back out swinging a golf club which makes him very happy.  He's gained some weight because of lack of exercise.  He would like to come off Zetia because of the expense.  He denies any angina or chest pain. His edema has been markedly improved with diuretics.  His EKG shows normal sinus rhythm first degree AV block. Otherwise normal EKG. Past Medical History  Diagnosis Date  . Gout   . Esophageal stricture     3-4x  . CAD (coronary artery disease)   . Mixed hyperlipidemia     Past Surgical History  Procedure Date  . Angioplasty 1993  . Coronary angioplasty with stent placement 05/1997  . Vasectomy   . Coronary artery bypass graft 03/2000    5 vessel  . Esophageal dilation     Dr. Lyla Son  . Tonsillectomy   . Appendectomy     Family History  Problem Relation Age of Onset  . Stroke Father   . Hypertension Father   . Pancreatic cancer Mother   . Cancer Mother     PANCREATIC  . Diabetes Maternal Grandmother     History   Social History  . Marital Status: Divorced    Spouse Name: N/A    Number of Children: N/A  . Years of Education: N/A   Occupational History  . Not on file.   Social History Main Topics  . Smoking status: Never Smoker   . Smokeless tobacco: Not on file  . Alcohol Use: No  . Drug Use: No  . Sexually Active: Not on file   Other Topics Concern  . Not on file   Social History Narrative  . No narrative on file    Allergies  Allergen Reactions  . Benazepril     angioedema    Current Outpatient Prescriptions  Medication Sig Dispense Refill  . allopurinol (ZYLOPRIM) 300 MG tablet Take 300 mg by mouth as directed. 1 1/2 by mouth daily      . amLODipine (NORVASC) 5 MG tablet Take 5 mg by mouth daily.        . Ascorbic  Acid (VITAMIN C) 500 MG tablet Take 500 mg by mouth daily.        [name] aspirin 81 MG tablet Take 81 mg by mouth daily.        [name] atorvastatin (LIPITOR) 40 MG tablet Take 1 tablet (40 mg total) by mouth daily.  90 tablet  3  . Calcium Citrate-Vitamin D (CITRACAL PETITES/VITAMIN D) 200-250 MG-UNIT TABS Take by mouth 2 (two) times daily.        [name] ezetimibe (ZETIA) 10 MG tablet Take 10 mg by mouth daily.        . fluticasone (FLONASE) 50 MCG/ACT nasal spray instill 1 spray into each nostril twice a day  16 g  5  . furosemide (LASIX) 40 MG tablet Take 40 mg by mouth as directed. 2 tabs q AM and 2 tab q PM      . gabapentin (NEURONTIN) 100 MG capsule take 1 capsule by mouth at bedtime  30 capsule  5  . glimepiride (AMARYL) 2 MG tablet TAKE ONE TABLET BY MOUTH EVERY MORNING  90 tablet  2  . glucose blood test strip 1 each by Other route as needed. Freestyle, check blood sugar  once daily       . Lancet Device MISC by Does not apply route. Freestyle, check blood sugar daily       . Lancets (FREESTYLE) lancets CHECK BLOOD SUGAR DAILY.  100 each  3  . metFORMIN (GLUCOPHAGE) 1000 MG tablet TAKE ONE TABLET BY MOUTH TWICE DAILY  180 tablet  2  . metoprolol (TOPROL-XL) 100 MG 24 hr tablet Take 100 mg by mouth daily. Take 1.5 tabs po once daily       . nitroGLYCERIN (NITROSTAT) 0.4 MG SL tablet Place 0.4 mg under the tongue every 5 (five) minutes as needed. May repeat 3x        . omeprazole-sodium bicarbonate (ZEGERID) 40-1100 MG per capsule Take 1 capsule by mouth daily.        . potassium chloride SA (K-DUR,KLOR-CON) 20 MEQ tablet Take 20 mEq by mouth as directed. 2 tabs q AM and 2 tab q PM      . Probiotic Product (ALIGN) 4 MG CAPS Take by mouth daily as needed.        [name] Respiratory Therapy Supplies (NEBULIZER COMPRESSOR) KIT by Does not apply route. Use with Proventil once daily         ROS Negative other than HPI.   PE General Appearance: well developed, well nourished in no acute distress, obese HEENT:  symmetrical face, PERRLA, good dentition  Neck: no JVD, thyromegaly, or adenopathy, trachea midline Chest: symmetric without deformity Cardiac: PMI non-displaced, RRR, normal S1, S2, no gallop or murmur Lung: clear to ausculation and percussion Vascular: all pulses full without bruits  Abdominal: nondistended, nontender, good bowel sounds, no HSM, no bruits Extremities: no cyanosis, clubbing or edema, no sign of DVT, no varicosities  Skin: normal color, no rashes Neuro: alert and oriented x 3, non-focal Pysch: normal affect Filed Vitals:   04/16/11 1457  BP: 140/74  Pulse: 78  Height: 6' 0.5" (1.842 m)  Weight: 272 lb (123.378 kg)    EKG  Labs and Studies Reviewed.   Lab Results  Component Value Date   WBC 8.6 12/04/2010   HGB 13.0 12/04/2010   HCT 39.5 12/04/2010   MCV 80.4 12/04/2010   PLT 452.0* 12/04/2010      Chemistry      Component Value Date/Time   NA 136 11/19/2010 1035   K 4.6 11/19/2010 1035   CL 99 11/19/2010 1035   CO2 25 11/19/2010 1035   BUN 33* 11/19/2010 1035   CREATININE 1.7* 11/19/2010 1035      Component Value Date/Time   CALCIUM 9.4 11/19/2010 1035   ALKPHOS 110 11/19/2010 1035   AST 21 11/19/2010 1035   ALT 22 11/19/2010 1035   BILITOT 0.7 11/19/2010 1035       Lab Results  Component Value Date   CHOL 150 11/19/2010   CHOL 146 03/21/2010   CHOL 168 05/04/2009   Lab Results  Component Value Date   HDL 32.00* 11/19/2010   HDL 30.80* 03/21/2010   HDL 33.10* 05/04/2009   Lab Results  Component Value Date   LDLCALC 84 02/02/2007   Lab Results  Component Value Date   TRIG 422.0* 11/19/2010   TRIG 329.0* 03/21/2010   TRIG 611.0* 05/04/2009   Lab Results  Component Value Date   CHOLHDL 5 11/19/2010   CHOLHDL 5 03/21/2010   CHOLHDL 5 05/04/2009   Lab Results  Component Value Date   HGBA1C 6.4 12/04/2010   Lab Results  Component Value Date   ALT 22  11/19/2010   AST 21 11/19/2010   ALKPHOS 110 11/19/2010   BILITOT 0.7 11/19/2010   Lab Results  Component  Value Date   TSH 1.76 03/21/2010

## 2011-04-16 NOTE — Assessment & Plan Note (Signed)
Improved. Continue current regimen and weight loss as well as increased activity.

## 2011-04-16 NOTE — Progress Notes (Signed)
Addended by: Horton Chin F on: 04/16/2011 06:10 PM   Modules accepted: Orders

## 2011-04-16 NOTE — Patient Instructions (Signed)
Your physician has recommended you make the following change in your medication:  Increase Atorvastatin Stop Zetia  Your physician recommends that you return for lab work in: January 15,2013 for fasting cholesterol  Your physician wants you to follow-up in: 1 year with Dr. Verl Blalock. You will receive a reminder letter in the mail two months in advance. If you don't receive a letter, please call our office to schedule the follow-up appointment.

## 2011-05-17 ENCOUNTER — Other Ambulatory Visit: Payer: Self-pay | Admitting: Cardiology

## 2011-05-17 NOTE — Telephone Encounter (Signed)
Clarification of direction.

## 2011-05-22 ENCOUNTER — Encounter: Payer: Self-pay | Admitting: Internal Medicine

## 2011-05-22 ENCOUNTER — Ambulatory Visit (INDEPENDENT_AMBULATORY_CARE_PROVIDER_SITE_OTHER): Payer: Medicare Other | Admitting: Internal Medicine

## 2011-05-22 DIAGNOSIS — R1033 Periumbilical pain: Secondary | ICD-10-CM

## 2011-05-22 DIAGNOSIS — R197 Diarrhea, unspecified: Secondary | ICD-10-CM

## 2011-05-22 DIAGNOSIS — M25511 Pain in right shoulder: Secondary | ICD-10-CM

## 2011-05-22 DIAGNOSIS — M25519 Pain in unspecified shoulder: Secondary | ICD-10-CM

## 2011-05-22 DIAGNOSIS — E119 Type 2 diabetes mellitus without complications: Secondary | ICD-10-CM

## 2011-05-22 MED ORDER — TRAMADOL HCL 50 MG PO TABS: 50.0000 mg | ORAL_TABLET | Freq: Four times a day (QID) | ORAL | Status: DC | PRN

## 2011-05-22 NOTE — Patient Instructions (Addendum)
Stay on clear liquids for 48-72 hours or until bowels are normal.This would include  jello, sherbert (NOT ice cream), Lipton's chicken noodle soup(NOT cream based soups),Gatorade Lite, flat Ginger ale (without High Fructose Corn Syrup),dry toast or crackers, baked potato.No milk , dairy or grease until bowels are formed. Align , a [employer] , daily if stools are loose. Immodium AD for frankly watery stool. Warning Signs include  increasing pain, fever or rectal bleeding .To ER if  Warning Signs appear. Take this record if you have to  go to ER. Stop metformin and generic Lipitor at this time. Stop the Zegerid and take the Nexium samples before breakfast and evening meal. The triggers for dyspepsia or "heart burn"  include stress; the "aspirin family" ; alcohol; peppermint; and caffeine (coffee, tea, cola, and chocolate). The aspirin family would include aspirin and the nonsteroidal agents such as ibuprofen &  Naproxen. Tylenol would not cause reflux. If having dyspepsia ; food & drink should be avoided for @ least 2 hours before going to bed.

## 2011-05-22 NOTE — Progress Notes (Signed)
Addended by: Kristeen Miss on: 05/22/2011 05:00 PM   Modules accepted: Orders

## 2011-05-22 NOTE — Progress Notes (Signed)
Subjective:    Patient ID: Richard [name], male    DOB: Aug 26, 1940, 70 y.o.   MRN: [id number]  HPI ABDOMINAL PAIN: Location: mid abdomen  Onset: 11/16  @ 2 pm ; 2 hrs after a fried  fish sandwich   Radiation: no  Severity: up to 10 Quality: throbbing  Duration: hours 11/16  Better with: NPO 11/16 & staying on clear liquids until today  Worse with: this am after boiled egg & pimento cheeze sandwich Symptoms Nausea/Vomiting: yes, 8-10x on 11/16  Diarrhea: all night 11/16  Constipation: no  Melena/BRBPR: no  Hematemesis: no  Anorexia: yes  Fever/Chills: yes,chills w/o fever all night 11/16  Dysuria/hematuria/pyuria: no  Rash: no  Wt loss: yes, ?  EtOH use: no  NSAIDs/ASA: yes, Advil 1 twice a day for shoulder pain  Past Surgeries: multiple esophageal dilations, last ? 2002.  He has been taking Zegerid daily; he was previously on Nexium but the cost was prohibitive.       Review of Systems some dysphagia for the last few days intermittently ,even with soup. FBS was 101.     Objective:   Physical Exam Gen.: Healthy and well-nourished in appearance. Alert, appropriate and cooperative throughout exam.  Eyes: No corneal or conjunctival inflammation noted. No icterus Mouth: Oral mucosa and oropharynx reveal no lesions or exudates.Upper partial;lower teeth with plague. Small polyp on tip of tongue. Marked erythema posterior oropharynx Neck: No deformities, masses, or tenderness noted. Range of motion & . Thyroid normal. Lungs: Normal respiratory effort; chest expands symmetrically. Lungs are clear to auscultation without rales, wheezes, or increased work of breathing. Heart: Normal rate and rhythm. Normal S1 and S2. No gallop, click, or rub. Grade 1/6 systolic  murmur. Abdomen: Bowel sounds are current rashes without classic ileus. There is some tenderness diffusely about the abdomen is  soft . He is most tender over the mid abdomen. There is no pain to percussion over the right  upper quadrant.  No masses, organomegaly or hernias noted.                                                                                  Musculoskeletal/extremities:  No clubbing, cyanosis noted. Trace edema. Vascular: Carotid, radial artery, dorsalis pedis and  posterior tibial pulses are full and equal. No bruits present. Neurologic: Alert and oriented x3. Deep tendon reflexes symmetrical and normal.          Skin: Intact without suspicious lesions or rashes. No jaundice present.  Lymph: No cervical, axillary, or inguinal lymphadenopathy present. Psych: Mood and affect are normal. Normally interactive                                                                                       .    A  ssPlanessment & Plan:   #1 abdominal pain greatest  in the mid abdomen. Symptoms began after ingestion of fried fish. Clinically he does not have  acute cholelithiasis. He has been taking nonsteroidals for her shoulder and has had severe esophageal reflux complicated by recurrent esophageal stricture. He does not ingest alcohol and clinically pancreatitis is not suspect. Food poisoning  should not be associated with this protracted course .  Plan: See orders and recommendations

## 2011-05-23 LAB — CBC WITH DIFFERENTIAL/PLATELET
Basophils Relative: 0 % (ref 0–1)
Eosinophils Absolute: 1.4 10*3/uL — ABNORMAL HIGH (ref 0.0–0.7)
Hemoglobin: 11.9 g/dL — ABNORMAL LOW (ref 13.0–17.0)
MCH: 25.7 pg — ABNORMAL LOW (ref 26.0–34.0)
MCHC: 30.8 g/dL (ref 30.0–36.0)
Monocytes Relative: 7 % (ref 3–12)
Neutrophils Relative %: 58 % (ref 43–77)
Platelets: 301 10*3/uL (ref 150–400)

## 2011-05-23 LAB — HEPATIC FUNCTION PANEL
ALT: 21 U/L (ref 0–53)
AST: 24 U/L (ref 0–37)
Albumin: 4.4 g/dL (ref 3.5–5.2)
Total Bilirubin: 0.3 mg/dL (ref 0.3–1.2)

## 2011-05-23 LAB — HEMOGLOBIN A1C: Mean Plasma Glucose: 134 mg/dL — ABNORMAL HIGH (ref <117)

## 2011-05-23 LAB — BASIC METABOLIC PANEL
BUN: 14 mg/dL (ref 6–23)
CO2: 24 mEq/L (ref 19–32)
Chloride: 106 mEq/L (ref 96–112)
Creat: 1.02 mg/dL (ref 0.50–1.35)
Glucose, Bld: 76 mg/dL (ref 70–99)

## 2011-05-23 LAB — AMYLASE: Amylase: 42 U/L (ref 0–105)

## 2011-06-04 ENCOUNTER — Ambulatory Visit (INDEPENDENT_AMBULATORY_CARE_PROVIDER_SITE_OTHER): Payer: Medicare Other | Admitting: Internal Medicine

## 2011-06-04 ENCOUNTER — Encounter: Payer: Self-pay | Admitting: Internal Medicine

## 2011-06-04 VITALS — BP 148/82 | HR 76 | Temp 98.3°F | Wt 265.0 lb

## 2011-06-04 DIAGNOSIS — R198 Other specified symptoms and signs involving the digestive system and abdomen: Secondary | ICD-10-CM

## 2011-06-04 DIAGNOSIS — D510 Vitamin B12 deficiency anemia due to intrinsic factor deficiency: Secondary | ICD-10-CM

## 2011-06-04 DIAGNOSIS — R194 Change in bowel habit: Secondary | ICD-10-CM

## 2011-06-04 DIAGNOSIS — K909 Intestinal malabsorption, unspecified: Secondary | ICD-10-CM

## 2011-06-04 DIAGNOSIS — K219 Gastro-esophageal reflux disease without esophagitis: Secondary | ICD-10-CM

## 2011-06-04 DIAGNOSIS — D649 Anemia, unspecified: Secondary | ICD-10-CM

## 2011-06-04 DIAGNOSIS — T887XXA Unspecified adverse effect of drug or medicament, initial encounter: Secondary | ICD-10-CM

## 2011-06-04 NOTE — Progress Notes (Signed)
Addended by: Secundino Ginger on: 06/04/2011 04:59 PM   Modules accepted: Orders

## 2011-06-04 NOTE — Patient Instructions (Signed)
The triggers for dyspepsia or "heart burn"  include stress; the "aspirin family" ; alcohol; peppermint; and caffeine (coffee, tea, cola, and chocolate). The aspirin family would include aspirin and the nonsteroidal agents such as ibuprofen &  Naproxen. Tylenol would not cause reflux. If having dyspepsia ; food & drink should be avoided for @ least 2 hours before going to bed.

## 2011-06-04 NOTE — Progress Notes (Signed)
  Subjective:    Patient ID: Richard [name], male    DOB: April 02, 1941, 70 y.o.   MRN: [id number]  HPI He had loose stools in the morning for 2 months regardless of diet. He had been taking Align without significant response until the last 2 days when he has noted formed stools.  The gastroenteritis symptoms have resolved completely. He denies hematemesis, abdominal pain,  melena, rectal bleeding, or constipation. He denies significant dyspepsia; he has been on Nexium twice a day before meals.  He does have a past history of dysphasia for which he had esophageal dilation remotely.  His father had ulcers     Review of Systems     Objective:   Physical Exam General appearance is one of good health and nourishment w/o distress.  Eyes: No conjunctival inflammation or scleral icterus is present.  Oral exam: Dental hygiene is good; lips and gums are healthy appearing.There is mild  oropharyngeal erythema w/o  exudate noted.   Heart:  Normal rate and regular rhythm. S1 and S2 normal without gallop,  click, rub .Grade 1/6 murmur  Lungs:Chest clear to auscultation; no wheezes, rhonchi,rales ,or rubs present.No increased work of breathing.   Abdomen: bowel sounds normal, soft and non-tender without masses, organomegaly or hernias noted.  No guarding or rebound   Skin:Warm & dry.  Intact without suspicious lesions or rashes ; no jaundice or tenting  Lymphatic: No lymphadenopathy is noted about the head, neck, axilla, or inguinal areas.             Assessment & Plan:   #1 bowel changes present for over 2 months; these are resolved in the last 48 hours  #2 past history of GERD with history of esophageal stricture; minimal symptoms on PPI therapy  Plan: I recommend decreasing the Nexium to once daily. Align should be stopped as his stools are now formed.

## 2011-06-05 ENCOUNTER — Other Ambulatory Visit: Payer: Self-pay | Admitting: Internal Medicine

## 2011-06-05 DIAGNOSIS — R198 Other specified symptoms and signs involving the digestive system and abdomen: Secondary | ICD-10-CM

## 2011-06-05 DIAGNOSIS — D510 Vitamin B12 deficiency anemia due to intrinsic factor deficiency: Secondary | ICD-10-CM

## 2011-06-05 DIAGNOSIS — K219 Gastro-esophageal reflux disease without esophagitis: Secondary | ICD-10-CM

## 2011-06-05 DIAGNOSIS — D649 Anemia, unspecified: Secondary | ICD-10-CM

## 2011-06-05 DIAGNOSIS — K909 Intestinal malabsorption, unspecified: Secondary | ICD-10-CM

## 2011-06-05 DIAGNOSIS — T887XXA Unspecified adverse effect of drug or medicament, initial encounter: Secondary | ICD-10-CM

## 2011-06-05 LAB — IBC PANEL
Iron: 27 ug/dL — ABNORMAL LOW (ref 42–165)
Transferrin: 326.4 mg/dL (ref 212.0–360.0)

## 2011-06-06 ENCOUNTER — Other Ambulatory Visit (INDEPENDENT_AMBULATORY_CARE_PROVIDER_SITE_OTHER): Payer: Medicare Other

## 2011-06-06 DIAGNOSIS — D510 Vitamin B12 deficiency anemia due to intrinsic factor deficiency: Secondary | ICD-10-CM

## 2011-06-06 DIAGNOSIS — E782 Mixed hyperlipidemia: Secondary | ICD-10-CM

## 2011-06-06 DIAGNOSIS — T887XXA Unspecified adverse effect of drug or medicament, initial encounter: Secondary | ICD-10-CM

## 2011-06-06 DIAGNOSIS — K219 Gastro-esophageal reflux disease without esophagitis: Secondary | ICD-10-CM

## 2011-06-06 DIAGNOSIS — R198 Other specified symptoms and signs involving the digestive system and abdomen: Secondary | ICD-10-CM

## 2011-06-06 DIAGNOSIS — K909 Intestinal malabsorption, unspecified: Secondary | ICD-10-CM

## 2011-06-06 DIAGNOSIS — J449 Chronic obstructive pulmonary disease, unspecified: Secondary | ICD-10-CM

## 2011-06-06 DIAGNOSIS — D649 Anemia, unspecified: Secondary | ICD-10-CM

## 2011-06-06 NOTE — Progress Notes (Signed)
12  

## 2011-06-07 LAB — CBC WITH DIFFERENTIAL/PLATELET
Basophils Absolute: 0 10*3/uL (ref 0.0–0.1)
Eosinophils Absolute: 0.1 10*3/uL (ref 0.0–0.7)
Lymphocytes Relative: 54 % — ABNORMAL HIGH (ref 12.0–46.0)
MCHC: 31.7 g/dL (ref 30.0–36.0)
Neutrophils Relative %: 27.9 % — ABNORMAL LOW (ref 43.0–77.0)
Platelets: 398 10*3/uL (ref 150.0–400.0)
RDW: 18.7 % — ABNORMAL HIGH (ref 11.5–14.6)

## 2011-06-07 LAB — HEPATIC FUNCTION PANEL
Albumin: 4 g/dL (ref 3.5–5.2)
Alkaline Phosphatase: 135 U/L — ABNORMAL HIGH (ref 39–117)
Bilirubin, Direct: 0 mg/dL (ref 0.0–0.3)

## 2011-06-07 LAB — LIPID PANEL
HDL: 40.1 mg/dL (ref >39.00)
Total CHOL/HDL Ratio: 4
VLDL: 47.6 mg/dL — ABNORMAL HIGH (ref 0.0–40.0)

## 2011-06-07 LAB — IBC PANEL
Saturation Ratios: 6.8 % — ABNORMAL LOW (ref 20.0–50.0)
Transferrin: 323.5 mg/dL (ref 212.0–360.0)

## 2011-06-12 ENCOUNTER — Ambulatory Visit (INDEPENDENT_AMBULATORY_CARE_PROVIDER_SITE_OTHER): Payer: Medicare Other

## 2011-06-12 DIAGNOSIS — D518 Other vitamin B12 deficiency anemias: Secondary | ICD-10-CM

## 2011-06-12 DIAGNOSIS — Z23 Encounter for immunization: Secondary | ICD-10-CM

## 2011-06-12 MED ORDER — CYANOCOBALAMIN 1000 MCG/ML IJ SOLN
1000.0000 ug | Freq: Once | INTRAMUSCULAR | Status: AC
  Administered 2011-06-12: 1000 ug via INTRAMUSCULAR

## 2011-06-12 NOTE — Progress Notes (Signed)
Addended by: Ewing Schlein on: 06/12/2011 03:21 PM   Modules accepted: Orders

## 2011-06-17 ENCOUNTER — Other Ambulatory Visit: Payer: Self-pay | Admitting: Cardiology

## 2011-06-19 ENCOUNTER — Ambulatory Visit (INDEPENDENT_AMBULATORY_CARE_PROVIDER_SITE_OTHER): Payer: Medicare Other | Admitting: *Deleted

## 2011-06-19 DIAGNOSIS — E538 Deficiency of other specified B group vitamins: Secondary | ICD-10-CM

## 2011-06-19 MED ORDER — CYANOCOBALAMIN 1000 MCG/ML IJ SOLN
1000.0000 ug | Freq: Once | INTRAMUSCULAR | Status: AC
  Administered 2011-06-19: 1000 ug via INTRAMUSCULAR

## 2011-06-20 ENCOUNTER — Other Ambulatory Visit (INDEPENDENT_AMBULATORY_CARE_PROVIDER_SITE_OTHER): Payer: Medicare Other

## 2011-06-20 DIAGNOSIS — Z1211 Encounter for screening for malignant neoplasm of colon: Secondary | ICD-10-CM

## 2011-06-20 LAB — HEMOCCULT GUIAC POC 1CARD (OFFICE): Card #3 Fecal Occult Blood, POC: NEGATIVE

## 2011-06-26 ENCOUNTER — Ambulatory Visit (INDEPENDENT_AMBULATORY_CARE_PROVIDER_SITE_OTHER): Payer: Medicare Other | Admitting: *Deleted

## 2011-06-26 DIAGNOSIS — E538 Deficiency of other specified B group vitamins: Secondary | ICD-10-CM

## 2011-06-26 DIAGNOSIS — D510 Vitamin B12 deficiency anemia due to intrinsic factor deficiency: Secondary | ICD-10-CM

## 2011-06-26 MED ORDER — CYANOCOBALAMIN 1000 MCG/ML IJ SOLN
1000.0000 ug | Freq: Once | INTRAMUSCULAR | Status: AC
  Administered 2011-06-26: 1000 ug via INTRAMUSCULAR

## 2011-06-29 ENCOUNTER — Other Ambulatory Visit: Payer: Self-pay | Admitting: Internal Medicine

## 2011-07-02 HISTORY — PX: UPPER GI ENDOSCOPY: SHX6162

## 2011-07-02 HISTORY — PX: COLONOSCOPY W/ BIOPSIES AND POLYPECTOMY: SHX1376

## 2011-07-03 ENCOUNTER — Ambulatory Visit (INDEPENDENT_AMBULATORY_CARE_PROVIDER_SITE_OTHER): Payer: Medicare Other

## 2011-07-03 DIAGNOSIS — D518 Other vitamin B12 deficiency anemias: Secondary | ICD-10-CM

## 2011-07-03 MED ORDER — CYANOCOBALAMIN 1000 MCG/ML IJ SOLN
1000.0000 ug | Freq: Once | INTRAMUSCULAR | Status: AC
  Administered 2011-07-03: 1000 ug via INTRAMUSCULAR

## 2011-07-16 ENCOUNTER — Other Ambulatory Visit: Payer: Self-pay | Admitting: Internal Medicine

## 2011-07-16 ENCOUNTER — Ambulatory Visit (INDEPENDENT_AMBULATORY_CARE_PROVIDER_SITE_OTHER): Payer: Medicare Other | Admitting: *Deleted

## 2011-07-16 DIAGNOSIS — E782 Mixed hyperlipidemia: Secondary | ICD-10-CM

## 2011-07-16 DIAGNOSIS — D649 Anemia, unspecified: Secondary | ICD-10-CM

## 2011-07-16 LAB — LIPID PANEL
Cholesterol: 160 mg/dL (ref 0–200)
Total CHOL/HDL Ratio: 5
VLDL: 116.6 mg/dL — ABNORMAL HIGH (ref 0.0–40.0)

## 2011-07-16 LAB — HEPATIC FUNCTION PANEL
Albumin: 4.1 g/dL (ref 3.5–5.2)
Alkaline Phosphatase: 164 U/L — ABNORMAL HIGH (ref 39–117)

## 2011-07-17 ENCOUNTER — Other Ambulatory Visit (INDEPENDENT_AMBULATORY_CARE_PROVIDER_SITE_OTHER): Payer: Medicare Other

## 2011-07-17 ENCOUNTER — Other Ambulatory Visit: Payer: Self-pay | Admitting: Cardiology

## 2011-07-17 DIAGNOSIS — D649 Anemia, unspecified: Secondary | ICD-10-CM

## 2011-07-17 MED ORDER — EZETIMIBE 10 MG PO TABS: 10.0000 mg | ORAL_TABLET | Freq: Every day | ORAL | Status: DC

## 2011-07-17 NOTE — Telephone Encounter (Signed)
Zetia called into [facility]

## 2011-07-17 NOTE — Telephone Encounter (Signed)
New problem  Patient says he was suppose to have a prescription from Zetia per recent labs.  Please return call to patient on mobile #

## 2011-07-18 LAB — CBC WITH DIFFERENTIAL/PLATELET
Basophils Absolute: 0 10*3/uL (ref 0.0–0.1)
Basophils Relative: 0.8 % (ref 0.0–3.0)
Eosinophils Absolute: 0.2 10*3/uL (ref 0.0–0.7)
Eosinophils Relative: 5 % (ref 0.0–5.0)
HCT: 38.4 % — ABNORMAL LOW (ref 39.0–52.0)
Hemoglobin: 12.7 g/dL — ABNORMAL LOW (ref 13.0–17.0)
Lymphocytes Relative: 49.6 % — ABNORMAL HIGH (ref 12.0–46.0)
Lymphs Abs: 2.1 10*3/uL (ref 0.7–4.0)
MCHC: 33.1 g/dL (ref 30.0–36.0)
MCV: 80.6 fl (ref 78.0–100.0)
Monocytes Absolute: 0.6 10*3/uL (ref 0.1–1.0)
Monocytes Relative: 13.9 % — ABNORMAL HIGH (ref 3.0–12.0)
Neutro Abs: 1.3 10*3/uL — ABNORMAL LOW (ref 1.4–7.7)
Neutrophils Relative %: 30.7 % — ABNORMAL LOW (ref 43.0–77.0)
Platelets: 332 10*3/uL (ref 150.0–400.0)
RBC: 4.76 Mil/uL (ref 4.22–5.81)
RDW: 18.4 % — ABNORMAL HIGH (ref 11.5–14.6)
WBC: 4.3 10*3/uL — ABNORMAL LOW (ref 4.5–10.5)

## 2011-07-18 MED ORDER — EZETIMIBE 10 MG PO TABS: 10.0000 mg | ORAL_TABLET | Freq: Every day | ORAL | Status: DC

## 2011-07-18 NOTE — Telephone Encounter (Signed)
FU Call: Pt calling stating that he went to [facility] to pick up Zetia and RX was not there. Please return pt call to discuss further.

## 2011-07-30 ENCOUNTER — Ambulatory Visit (INDEPENDENT_AMBULATORY_CARE_PROVIDER_SITE_OTHER): Payer: Medicare Other | Admitting: Internal Medicine

## 2011-07-30 ENCOUNTER — Encounter: Payer: Self-pay | Admitting: Internal Medicine

## 2011-07-30 DIAGNOSIS — E119 Type 2 diabetes mellitus without complications: Secondary | ICD-10-CM | POA: Diagnosis not present

## 2011-07-30 DIAGNOSIS — K219 Gastro-esophageal reflux disease without esophagitis: Secondary | ICD-10-CM

## 2011-07-30 DIAGNOSIS — D518 Other vitamin B12 deficiency anemias: Secondary | ICD-10-CM | POA: Diagnosis not present

## 2011-07-30 DIAGNOSIS — J069 Acute upper respiratory infection, unspecified: Secondary | ICD-10-CM | POA: Diagnosis not present

## 2011-07-30 DIAGNOSIS — D519 Vitamin B12 deficiency anemia, unspecified: Secondary | ICD-10-CM | POA: Insufficient documentation

## 2011-07-30 DIAGNOSIS — J209 Acute bronchitis, unspecified: Secondary | ICD-10-CM | POA: Diagnosis not present

## 2011-07-30 MED ORDER — CEFUROXIME AXETIL 500 MG PO TABS: 500.0000 mg | ORAL_TABLET | Freq: Two times a day (BID) | ORAL | Status: AC

## 2011-07-30 MED ORDER — CYANOCOBALAMIN 1000 MCG/ML IJ SOLN
1000.0000 ug | Freq: Once | INTRAMUSCULAR | Status: AC
  Administered 2011-07-30: 1000 ug via INTRAMUSCULAR

## 2011-07-30 MED ORDER — FLUTICASONE PROPIONATE 50 MCG/ACT NA SUSP: NASAL | Status: DC

## 2011-07-30 MED ORDER — ESOMEPRAZOLE MAGNESIUM 40 MG PO CPDR: 40.0000 mg | DELAYED_RELEASE_CAPSULE | Freq: Every day | ORAL | Status: DC

## 2011-07-30 NOTE — Patient Instructions (Signed)
A1c GOALS  Non diabetic adults: 5 %-6.1%  Good diabetic control: 6.2-6.4 %  Fair diabetic control: 6.5-7%  Poor diabetic control: greater than 7 % ( except with additional factors such as  advanced age; significant coronary or neurologic disease,etc). Check the A1c in may as it is < 6.5%. Goals for home glucose monitoring are : fasting  or morning glucose goal of  90-150. Two hours after any meal , goal = < 180, preferably < 160. Report any low blood glucoses immediately.

## 2011-07-30 NOTE — Assessment & Plan Note (Signed)
Symptoms controlled with Nexium; long-term prescription filled

## 2011-07-30 NOTE — Assessment & Plan Note (Signed)
A1c was well controlled in November 2012. A1c would be in April. Glucose goals discussed.

## 2011-07-30 NOTE — Progress Notes (Signed)
  Subjective:    Patient ID: Richard [name], male    DOB: 11-Mar-1941, 71 y.o.   MRN: [id number]  HPI Respiratory tract infection Onset/symptoms:1/25 as dry cough Exposures (illness/environmental/extrinsic):? Ill students Progression of symptoms:to sputum production Treatments/response:Mucinexhelped Present symptoms: Fever/chills/sweats:no Frontal headache:no Facial pain:no Nasal purulence:no Sore throat:no Dental pain:no Lymphadenopathy:some Wheezing/shortness of breath:minor wheezing Cough/sputum/hemoptysis:light yellow with round plugs Pleuritic pain:no Associated extrinsic/allergic symptoms:itchy eyes/ sneezing:yes in past month Past medical history: Seasonal allergies :no, but  perennial /asthma:no Smoking history:never No FH of lung disease           Review of Systems Fasting blood sugars have  been in the 118-120 range. He does have some urinary frequency but denies dysuria, pyuria, or hematuria. He denies polyphagia or polydipsia.  His A1c was 6.3% on 05/22/11  Nexium has controlled his reflux doing well. He denies dysphagia, melena, or rectal bleeding. The co-pay will be  $35 on his new plan for Nexium     Objective:   Physical Exam General appearance:good health ;well nourished; no acute distress or increased work of breathing is present.  No  lymphadenopathy about the head, neck, or axilla noted.   Eyes: No conjunctival inflammation or lid edema is present.  Ears:  External ear exam shows no significant lesions or deformities.  Otoscopic examination reveals clear canals, tympanic membranes are intact bilaterally without bulging, retraction, inflammation or discharge.  Nose:  External nasal examination shows no deformity or inflammation. Nasal mucosa are pink and moist without lesions or exudates. No septal dislocation or deviation.No obstruction to airflow.  Hyponasal speech  Oral exam: Dental hygiene is good; lips and gums are healthy appearing.There is mild  oropharyngeal erythema w/o exudate. Upper partial.     Heart:  Normal rate and regular rhythm. S1 and S2 normal without gallop,  click, rub or other extra sounds. Grade 1/6 systolic murmur   Lungs:Chest clear to auscultation; no wheezes, rhonchi,rales ,or rubs present.No increased work of breathing.    Extremities:  No cyanosis, edema, or clubbing  noted    Skin: Warm & dry           Assessment & Plan:  #1 bronchitis, acute. He describes no wheezing; I do not hear bronchospasm at this time  #2 mild upper tract symptoms without rhinosinusitis

## 2011-07-31 ENCOUNTER — Ambulatory Visit: Payer: Medicare Other

## 2011-07-31 DIAGNOSIS — L570 Actinic keratosis: Secondary | ICD-10-CM | POA: Diagnosis not present

## 2011-07-31 DIAGNOSIS — Z85828 Personal history of other malignant neoplasm of skin: Secondary | ICD-10-CM | POA: Diagnosis not present

## 2011-08-05 DIAGNOSIS — S43429A Sprain of unspecified rotator cuff capsule, initial encounter: Secondary | ICD-10-CM | POA: Diagnosis not present

## 2011-08-12 ENCOUNTER — Telehealth: Payer: Self-pay

## 2011-08-12 ENCOUNTER — Encounter: Payer: Self-pay | Admitting: Internal Medicine

## 2011-08-12 ENCOUNTER — Ambulatory Visit (INDEPENDENT_AMBULATORY_CARE_PROVIDER_SITE_OTHER): Payer: Medicare Other | Admitting: Internal Medicine

## 2011-08-12 VITALS — BP 130/72 | HR 72 | Temp 98.7°F | Wt 262.2 lb

## 2011-08-12 DIAGNOSIS — J209 Acute bronchitis, unspecified: Secondary | ICD-10-CM

## 2011-08-12 DIAGNOSIS — J069 Acute upper respiratory infection, unspecified: Secondary | ICD-10-CM

## 2011-08-12 MED ORDER — DOXYCYCLINE HYCLATE 100 MG PO TABS: 100.0000 mg | ORAL_TABLET | Freq: Two times a day (BID) | ORAL | Status: AC

## 2011-08-12 MED ORDER — HYDROCODONE-HOMATROPINE 5-1.5 MG/5ML PO SYRP: 5.0000 mL | ORAL_SOLUTION | Freq: Four times a day (QID) | ORAL | Status: AC | PRN

## 2011-08-12 NOTE — Telephone Encounter (Signed)
Dr.Hopper please advise on med for patient to use in nebulizer, I  reviewed med list and did not see a solution for nebulizer

## 2011-08-12 NOTE — Progress Notes (Signed)
  Subjective:    Patient ID: Richard [name], male    DOB: 11/15/1940, 71 y.o.   MRN: [id number]  HPI Respiratory tract infection Onset/symptoms:completed Ceftin 2/7; rhinitis 2/8 with Temp of 99 on 2/8 & 2/9 Exposures (illness/environmental/extrinsic):friend ill Progression of symptoms:to chest congestion Treatments/response:Mucinex with slight improvement;Delsym w/o help; Neti pot Present symptoms: Fever/chills/sweats:not now Frontal headache:no Facial pain:no Nasal purulence:only after Neti pot from R nare Sore throat:no Dental pain:no Lymphadenopathy:no Wheezing/shortness of breath:slight wheezing Cough/sputum/hemoptysis: slight yellow Pleuritic pain:no Associated extrinsic/allergic symptoms:itchy eyes/ sneezing:not now. On Allegra          Review of Systems   He has been putting Vaseline in the nares at bedtime because of dryness     Objective:   Physical Exam General appearance:good health ;well nourished; no acute distress or increased work of breathing is present.  No  lymphadenopathy about the head, neck, or axilla noted.   Eyes: No conjunctival inflammation or lid edema is present.   Ears:  External ear exam shows no significant lesions or deformities.  Otoscopic examination reveals clear canals, tympanic membranes are intact bilaterally without bulging, retraction, inflammation or discharge.  Nose:  External nasal examination shows no deformity or inflammation. Nasal mucosa are slightly erythematous without lesions or exudates. No septal dislocation or deviation.No obstruction to airflow.   Oral exam: Dental hygiene is good; lips and gums are healthy appearing.There is mild  oropharyngeal erythema ; no exudate noted.     Heart:  Very distant extra sounds.   Lungs:Chest clear to auscultation; no wheezes, rhonchi,rales ,or rubs present.No increased work of breathing.    Extremities:  No cyanosis, edema, or clubbing  noted    Skin: Warm & dry            Assessment & Plan:    #1 bronchitis, acute. Clinically no reactive airways component at this time. History suggests possible atypical organisms as he has completed 10 days of Ceftin  #2 upper respiratory tract symptoms without Veron rhinosinusitis  Plan: See orders and recommendations

## 2011-08-12 NOTE — Patient Instructions (Addendum)
Plain Mucinex for thick secretions ;force NON dairy fluids . Use a Neti pot daily as needed for sinus congestion. Nasal cleansing in the shower as discussed. Make sure that all residual soap is removed to prevent irritation.  Stop putting Vaseline into the  nose because of the risk of "lipoid pneumonia" as discussed

## 2011-08-12 NOTE — Telephone Encounter (Signed)
Generic DuoNeb 1 ampule every 4-6 hours as needed dispense 90

## 2011-08-13 MED ORDER — IPRATROPIUM-ALBUTEROL 0.5-2.5 (3) MG/3ML IN SOLN: RESPIRATORY_TRACT | Status: DC

## 2011-08-13 NOTE — Telephone Encounter (Signed)
RX faxed

## 2011-08-19 ENCOUNTER — Other Ambulatory Visit: Payer: Self-pay | Admitting: Internal Medicine

## 2011-08-19 ENCOUNTER — Other Ambulatory Visit: Payer: Self-pay | Admitting: Cardiology

## 2011-08-19 MED ORDER — ATORVASTATIN CALCIUM 80 MG PO TABS: 80.0000 mg | ORAL_TABLET | Freq: Every day | ORAL | Status: DC

## 2011-08-26 DIAGNOSIS — M549 Dorsalgia, unspecified: Secondary | ICD-10-CM | POA: Diagnosis not present

## 2011-09-02 ENCOUNTER — Other Ambulatory Visit: Payer: Self-pay | Admitting: Cardiology

## 2011-09-02 MED ORDER — METOPROLOL SUCCINATE ER 100 MG PO TB24: ORAL_TABLET | ORAL | Status: DC

## 2011-09-02 MED ORDER — AMLODIPINE BESYLATE 5 MG PO TABS: 5.0000 mg | ORAL_TABLET | Freq: Every day | ORAL | Status: DC

## 2011-09-16 ENCOUNTER — Telehealth: Payer: Self-pay | Admitting: Internal Medicine

## 2011-09-16 MED ORDER — MONTELUKAST SODIUM 10 MG PO TABS: 10.0000 mg | ORAL_TABLET | Freq: Every day | ORAL | Status: DC | PRN

## 2011-09-16 NOTE — Telephone Encounter (Signed)
Generic Singulair  10 mg daily as needed is an option, #30. Unfortunately this may be very expensive. The best nonsedating allergy medicine is generic Allegra 180 mg

## 2011-09-16 NOTE — Telephone Encounter (Signed)
Rx sent 

## 2011-09-16 NOTE — Telephone Encounter (Signed)
Patient called & states he has had an allergie issue since Friday & would rather not come in. If possible could Dr. Linna [name] call him in something? Please call on cell # [phone]

## 2011-09-16 NOTE — Telephone Encounter (Signed)
Pt c/o watery eyes, sneezing and runny nose since Friday. Pt would like to know if anything can be Rx for him. Pt notes that he has tried Zyrtec and allergy 24 which has not helped much. Please advise

## 2011-09-25 ENCOUNTER — Telehealth: Payer: Self-pay | Admitting: Internal Medicine

## 2011-09-25 NOTE — Telephone Encounter (Signed)
This needs to be done as he has a chronic issue; see problem list

## 2011-09-25 NOTE — Telephone Encounter (Signed)
Pt received a letter today from [place] that he needs to re certify for his O2 concentration machine per Medicare guidelines. Pls call.

## 2011-09-30 NOTE — Telephone Encounter (Signed)
Called [phone]) to see what is needed in order to recert Pt. Per message on phone they are currently close and after hours message pick up will try again on tomorrow.

## 2011-09-30 NOTE — Telephone Encounter (Signed)
Discuss with patient, appt schedule for recert.

## 2011-10-03 ENCOUNTER — Ambulatory Visit (INDEPENDENT_AMBULATORY_CARE_PROVIDER_SITE_OTHER): Payer: Medicare Other | Admitting: Internal Medicine

## 2011-10-03 ENCOUNTER — Encounter: Payer: Self-pay | Admitting: Internal Medicine

## 2011-10-03 VITALS — BP 136/78 | HR 86 | Temp 98.2°F | Wt 270.0 lb

## 2011-10-03 DIAGNOSIS — J449 Chronic obstructive pulmonary disease, unspecified: Secondary | ICD-10-CM | POA: Diagnosis not present

## 2011-10-03 DIAGNOSIS — D518 Other vitamin B12 deficiency anemias: Secondary | ICD-10-CM

## 2011-10-03 DIAGNOSIS — D519 Vitamin B12 deficiency anemia, unspecified: Secondary | ICD-10-CM

## 2011-10-03 DIAGNOSIS — I10 Essential (primary) hypertension: Secondary | ICD-10-CM | POA: Diagnosis not present

## 2011-10-03 DIAGNOSIS — R351 Nocturia: Secondary | ICD-10-CM

## 2011-10-03 DIAGNOSIS — E119 Type 2 diabetes mellitus without complications: Secondary | ICD-10-CM

## 2011-10-03 DIAGNOSIS — G4730 Sleep apnea, unspecified: Secondary | ICD-10-CM

## 2011-10-03 DIAGNOSIS — J4489 Other specified chronic obstructive pulmonary disease: Secondary | ICD-10-CM

## 2011-10-03 NOTE — Assessment & Plan Note (Signed)
Prior to nocturnal oxygen he had nocturia almost hourly. With nocturnal oxygen this occurs once nightly.

## 2011-10-03 NOTE — Progress Notes (Signed)
  Subjective:    Patient ID: Richard [name], male    DOB: March 19, 1941, 71 y.o.   MRN: [id number]  HPI At this time he is very stable on CPAP and nocturnal oxygen. The frequent nocturnal awakenings and hourly nocturia have resolved.  Clinical reassessment is required for oxygen need certification  His exercise capacity is stable and he denies fever, chills, sweats or purulent secretions    Review of Systems off metformin his fasting blood sugars average 125; he is not checking postprandial glucoses. He has frequency only after a diuretic. He denies polyphagia, polydipsia, or significant weight loss. He also denies any significant vision changes. Last A1c was 6.3 in November 2012  His B12 level was 76  in December 2012; he took shots weekly x3 and then monthly  for 2 months. He is not taking a B12 shot per month and half.     Objective:   Physical Exam He appears  well-nourished; he is in no acute distress. Thyroid normal   No carotid bruits are present.  Heart rhythm and rate are normal with no gallops.Grade 1/6 systolic murmur   Chest is clear with no increased work of breathing  There is no evidence of aortic aneurysm or renal artery bruits  He has no clubbing or cyanosis. Trace - 1/2+ edema.   Pedal pulses are intact   Light touch normal over feet.  DTRs 1/2-1+  No ischemic skin changes are present         Assessment & Plan:

## 2011-10-03 NOTE — Assessment & Plan Note (Signed)
A1c will be checked along with urine microalbumin

## 2011-10-03 NOTE — Assessment & Plan Note (Signed)
He states blood pressures at home are 145/70; control today is excellent.

## 2011-10-03 NOTE — Patient Instructions (Signed)
Share report with  [facility]. Please try to go on My Chart within the next 24 hours to allow me to release the results directly to you.

## 2011-10-04 DIAGNOSIS — M999 Biomechanical lesion, unspecified: Secondary | ICD-10-CM | POA: Diagnosis not present

## 2011-10-04 DIAGNOSIS — IMO0002 Reserved for concepts with insufficient information to code with codable children: Secondary | ICD-10-CM | POA: Diagnosis not present

## 2011-10-04 DIAGNOSIS — M5137 Other intervertebral disc degeneration, lumbosacral region: Secondary | ICD-10-CM | POA: Diagnosis not present

## 2011-10-04 LAB — CBC WITH DIFFERENTIAL/PLATELET
Basophils Relative: 1 % (ref 0.0–3.0)
Eosinophils Absolute: 0.1 10*3/uL (ref 0.0–0.7)
Hemoglobin: 12.8 g/dL — ABNORMAL LOW (ref 13.0–17.0)
Lymphocytes Relative: 54.9 % — ABNORMAL HIGH (ref 12.0–46.0)
MCHC: 32.5 g/dL (ref 30.0–36.0)
Neutro Abs: 1 10*3/uL — ABNORMAL LOW (ref 1.4–7.7)
RBC: 4.77 Mil/uL (ref 4.22–5.81)

## 2011-10-04 LAB — MICROALBUMIN / CREATININE URINE RATIO
Creatinine,U: 120.9 mg/dL
Microalb Creat Ratio: 0.2 mg/g (ref 0.0–30.0)
Microalb, Ur: 0.3 mg/dL (ref 0.0–1.9)

## 2011-10-07 ENCOUNTER — Telehealth: Payer: Self-pay | Admitting: Internal Medicine

## 2011-10-07 DIAGNOSIS — M1A00X0 Idiopathic chronic gout, unspecified site, without tophus (tophi): Secondary | ICD-10-CM | POA: Diagnosis not present

## 2011-10-07 NOTE — Telephone Encounter (Signed)
Caller: Richard [name]/Patient; Phone Number: [phone]; Message from caller: Verifying fax was sent to [facility] for his prescription. Wanting someone to call him back so he is aware.

## 2011-10-07 NOTE — Telephone Encounter (Signed)
Form was faxed 4/4 to [facility]

## 2011-10-08 ENCOUNTER — Telehealth: Payer: Self-pay | Admitting: Internal Medicine

## 2011-10-08 NOTE — Telephone Encounter (Signed)
Caller: Mackson/Patient; PCP: Unice Cobble; CB#: [phone]; ; ; Call regarding Patient Calling To See If Form Was Faxed To [facility]; states that prior to call 10/08/11, called twice for this.  Per Epic, Dr. Linna Darner signed off on note which states the form was faxed to [facility] 10/03/11.  Patient advised; no further questions or concerns.

## 2011-10-08 NOTE — Telephone Encounter (Signed)
Spoke to Pt who states that he is already aware.

## 2011-10-14 DIAGNOSIS — M1A00X0 Idiopathic chronic gout, unspecified site, without tophus (tophi): Secondary | ICD-10-CM | POA: Diagnosis not present

## 2011-10-21 ENCOUNTER — Other Ambulatory Visit: Payer: Self-pay | Admitting: Internal Medicine

## 2011-10-21 DIAGNOSIS — L821 Other seborrheic keratosis: Secondary | ICD-10-CM | POA: Diagnosis not present

## 2011-10-21 DIAGNOSIS — L570 Actinic keratosis: Secondary | ICD-10-CM | POA: Diagnosis not present

## 2011-10-22 ENCOUNTER — Telehealth: Payer: Self-pay | Admitting: Internal Medicine

## 2011-10-22 ENCOUNTER — Other Ambulatory Visit: Payer: Self-pay | Admitting: Internal Medicine

## 2011-10-22 DIAGNOSIS — D519 Vitamin B12 deficiency anemia, unspecified: Secondary | ICD-10-CM

## 2011-10-22 DIAGNOSIS — D72820 Lymphocytosis (symptomatic): Secondary | ICD-10-CM

## 2011-10-22 DIAGNOSIS — D649 Anemia, unspecified: Secondary | ICD-10-CM

## 2011-10-22 NOTE — Telephone Encounter (Signed)
Caller: Richard [name]/Patient; Phone Number: [phone]; Message from caller: Pt has reviewed his labs on line.  He sees he needs a referral to a hematologist. Pls call and advise.

## 2011-10-25 ENCOUNTER — Telehealth: Payer: Self-pay | Admitting: Hematology & Oncology

## 2011-10-25 NOTE — Telephone Encounter (Signed)
Called to schedule appointment a person answered said he was not the patient and to call back and leave message on the machine. I left message

## 2011-11-18 ENCOUNTER — Other Ambulatory Visit: Payer: Self-pay | Admitting: Internal Medicine

## 2011-11-22 ENCOUNTER — Other Ambulatory Visit (HOSPITAL_COMMUNITY)
Admission: RE | Admit: 2011-11-22 | Discharge: 2011-11-22 | Disposition: A | Discharge status: Home / Self Care | Payer: Medicare Other | Source: Ambulatory Visit | Attending: Hematology & Oncology | Admitting: Hematology & Oncology

## 2011-11-22 ENCOUNTER — Ambulatory Visit: Payer: Medicare Other

## 2011-11-22 ENCOUNTER — Ambulatory Visit (HOSPITAL_BASED_OUTPATIENT_CLINIC_OR_DEPARTMENT_OTHER): Payer: Medicare Other | Admitting: Hematology & Oncology

## 2011-11-22 ENCOUNTER — Encounter: Payer: Medicare Other | Admitting: Internal Medicine

## 2011-11-22 ENCOUNTER — Other Ambulatory Visit: Payer: Self-pay | Admitting: Internal Medicine

## 2011-11-22 ENCOUNTER — Other Ambulatory Visit (HOSPITAL_BASED_OUTPATIENT_CLINIC_OR_DEPARTMENT_OTHER): Payer: Medicare Other | Admitting: Lab

## 2011-11-22 VITALS — BP 133/66 | HR 79 | Temp 96.8°F | Ht 72.0 in | Wt 271.0 lb

## 2011-11-22 DIAGNOSIS — E119 Type 2 diabetes mellitus without complications: Secondary | ICD-10-CM

## 2011-11-22 DIAGNOSIS — D510 Vitamin B12 deficiency anemia due to intrinsic factor deficiency: Secondary | ICD-10-CM

## 2011-11-22 DIAGNOSIS — D649 Anemia, unspecified: Secondary | ICD-10-CM

## 2011-11-22 DIAGNOSIS — D72820 Lymphocytosis (symptomatic): Secondary | ICD-10-CM | POA: Diagnosis not present

## 2011-11-22 DIAGNOSIS — R599 Enlarged lymph nodes, unspecified: Secondary | ICD-10-CM | POA: Diagnosis not present

## 2011-11-22 DIAGNOSIS — I10 Essential (primary) hypertension: Secondary | ICD-10-CM

## 2011-11-22 DIAGNOSIS — D518 Other vitamin B12 deficiency anemias: Secondary | ICD-10-CM | POA: Diagnosis not present

## 2011-11-22 LAB — CBC WITH DIFFERENTIAL (CANCER CENTER ONLY)
BASO#: 0 10*3/uL (ref 0.0–0.2)
BASO%: 0.4 % (ref 0.0–2.0)
EOS%: 2.8 % (ref 0.0–7.0)
HCT: 38 % — ABNORMAL LOW (ref 38.7–49.9)
HGB: 12.3 g/dL — ABNORMAL LOW (ref 13.0–17.1)
MCH: 26.6 pg — ABNORMAL LOW (ref 28.0–33.4)
MCHC: 32.4 g/dL (ref 32.0–35.9)
MONO%: 9.8 % (ref 0.0–13.0)
NEUT%: 63.4 % (ref 40.0–80.0)
RDW: 16.6 % — ABNORMAL HIGH (ref 11.1–15.7)

## 2011-11-22 NOTE — Telephone Encounter (Signed)
Refill done.  

## 2011-11-26 LAB — COMPREHENSIVE METABOLIC PANEL
Albumin: 4.2 g/dL (ref 3.5–5.2)
Alkaline Phosphatase: 139 U/L — ABNORMAL HIGH (ref 39–117)
BUN: 26 mg/dL — ABNORMAL HIGH (ref 6–23)
Creatinine, Ser: 1.17 mg/dL (ref 0.50–1.35)
Glucose, Bld: 110 mg/dL — ABNORMAL HIGH (ref 70–99)
Total Bilirubin: 0.4 mg/dL (ref 0.3–1.2)

## 2011-11-26 LAB — IRON AND TIBC
%SAT: 10 % — ABNORMAL LOW (ref 20–55)
Iron: 41 ug/dL — ABNORMAL LOW (ref 42–165)
TIBC: 402 ug/dL (ref 215–435)
UIBC: 361 ug/dL (ref 125–400)

## 2011-11-26 LAB — VITAMIN B12: Vitamin B-12: 219 pg/mL (ref 211–911)

## 2011-11-26 LAB — RETICULOCYTES (CHCC): RBC.: 4.74 MIL/uL (ref 4.22–5.81)

## 2011-11-26 NOTE — Progress Notes (Signed)
This office note has been dictated.

## 2011-11-26 NOTE — Progress Notes (Signed)
CC:   Darrick Penna. Linna Darner, MD,FACP,FCCP  DIAGNOSES: 1. Transient lymphocytosis. 2. Transient anemia.  HISTORY OF PRESENT ILLNESS:  Richard [name] is a really nice 71 year old white gentleman.  He is followed by Dr. Unice Cobble.  He has numerous medical problems.  He is on quite a few medications.  He has diabetes. He has hyperlipidemia.  I think he has hypertension.  He has sleep apnea.  He has been found to have "B12 deficiency."  Back in December, he was found to have a B12 of 76.  He was treated with vitamin B12.  Back in April, his B12 level was 174.  Surprisingly enough, he was really not even anemic back when he had the B12 deficiency.  He really was not even macrocytic.  He apparently has had a transient lymphocytosis.  He had lab work done back in April.  This showed a white cell count 4.1.  White cell differential showed 24 segs, 55 lymphs, 18 monos.  This lymphocytosis was noted back in December 2012.  Back in June of 2012, he had a normal white cell differential.  He has had no obvious bleeding.  He has had no real change in his medications.  He plays golf quite a bit.  He used to work for [employer] and really enjoyed this job.  Again, he is retired but plays golf and tries to stay active.  Again, we were asked to see him in referral by Dr. Linna Darner to try to help with his blood.  PAST MEDICAL HISTORY: 1. Diabetes. 2. Hypertension. 3. Hyperlipidemia. 4. Sleep apnea. 5. B12 deficiency.  ALLERGIES:  Benazepril.  MEDICATIONS:  Allopurinol 300 mg p.o. as needed, Norvasc 5 mg p.o. daily, aspirin 81 mg p.o. daily, Lipitor 80 mg p.o. q.h.s., Zetia 10 mg p.o. daily, Flonase nasal spray as directed, Lasix 40 mg p.o. daily, Neurontin 100 mg p.o. t.i.d., Amaryl 2 mg p.o. daily, DuoNeb as indicated, metoprolol-XL 100 mg p.o. daily, Singulair 10 mg p.o. daily p.r.n., potassium chloride 20 mEq p.o. as needed, Ultram 50 mg p.o. q.6 hours p.r.n.  SOCIAL HISTORY:   Remarkable for past tobacco use.  There is social alcohol use.  He has no obvious occupational exposures.  FAMILY HISTORY:  Remarkable for pancreatic cancer in his mother.  There is a history of hypertension and stroke.  There is also a history of diabetes.  REVIEW OF SYSTEMS:  As stated in history of present illness.  No additional findings noted on 12-system review.  PHYSICAL EXAM:  This is a fairly well-developed, well-nourished white gentleman who is somewhat obese.  Vital signs:  Temp 96.8, pulse 79, respiratory rate 24, blood pressure 133/66, weight is 271.  Head and neck:  Normocephalic, atraumatic skull.  There are no ocular or oral lesions.  There are no palpable cervical or supraclavicular lymph nodes. Lungs:  Clear to percussion and auscultation bilaterally.  Cardiac: Regular rate and rhythm with normal S1, S2.  There are no murmurs, rubs or bruits.  Abdomen:  Soft with good bowel sounds.  There is no palpable abdominal mass.  He is mildly obese.  He has no fluid wave.  There is no palpable hepatosplenomegaly.  Extremities:  Trace edema in his legs bilaterally.  He has good range motion of his joints.  He has good pulses in his distal extremities.  Skin:  Shows no rashes, ecchymosis or petechia.  Neurological:  No focal neurological deficits.  LABORATORY STUDIES:  White cell count is 8.1, hemoglobin 12.3, hematocrit 38,  platelet count 259.  MCV is 82.  Peripheral smear shows a normochromic, normocytic population of red blood cells.  There are no nucleated red blood cells.  There are no teardrop cells.  I see no rouleaux formation.  He has no target cells.  White cells appear normal in morphology and maturation.  I do not appreciate any hypersegmented polys.  There is no immature myeloid or lymphoid cells.  I see no atypical lymphocytes.  There are no blasts.  Platelets are adequate in number and size.  IMPRESSION:  Richard [name] is a 71 year old gentleman with a  transient lymphocytosis.  His white cell differential is absolutely normal today. I am not sure as to why he had this increase in lymphocytes a month or so ago.  I do not know if he had some type of infection that may have triggered lymphocyte response.  I certainly do not see any underlying hematologic malignancy that is causing problems.  I do not see any indication for a bone marrow biopsy.  He is not macrocytic that would suggest any B12 deficiency issues.  He is minimally anemic.  I think the anemia is more so because of his multiple comorbidities and medical conditions.  I would not get too concerned regarding this anemia.  He is on a lot of medications.  He probably has some degree of erythroid insufficiency.  I really had a nice time with Mr. Islas.  I just do not see that we have to do anything invasive with him.  I do not see that he needs any kind of B12 injections right now.  He did take B12 over the counter if he wished.  I do not think that we need to get him back to be seen.  I just do not see that we have any kind of hematologic issue here.  I will be more than happy to get him back if there are any problems in the future.  I think his prognosis clearly is going to be dependent upon his diabetes more than anything else.  I spent a good hour or more with Mr. Creese.  It was really nice talking to him.  He is a very interesting guy.    ______________________________ [name], M.D. PRE/MEDQ  D:  11/26/2011  T:  11/26/2011  Job:  [id number]

## 2011-11-27 LAB — FLOW CYTOMETRY - CHCC SATELLITE

## 2011-11-29 ENCOUNTER — Other Ambulatory Visit: Payer: Self-pay | Admitting: Hematology & Oncology

## 2011-11-29 DIAGNOSIS — D509 Iron deficiency anemia, unspecified: Secondary | ICD-10-CM

## 2011-12-05 ENCOUNTER — Telehealth: Payer: Self-pay | Admitting: Cardiology

## 2011-12-05 ENCOUNTER — Other Ambulatory Visit: Payer: Self-pay | Admitting: Cardiology

## 2011-12-05 MED ORDER — FUROSEMIDE 40 MG PO TABS: 40.0000 mg | ORAL_TABLET | ORAL | Status: DC

## 2011-12-05 MED ORDER — POTASSIUM CHLORIDE CRYS ER 20 MEQ PO TBCR: 20.0000 meq | EXTENDED_RELEASE_TABLET | ORAL | Status: DC

## 2011-12-05 MED ORDER — FUROSEMIDE 40 MG PO TABS: 40.0000 mg | ORAL_TABLET | Freq: Every day | ORAL | Status: DC

## 2011-12-05 NOTE — Telephone Encounter (Signed)
New problem:     Called in needing clarification on the patients potassium chloride SA (K-DUR,KLOR-CON) 20 MEQ tablet and furosemide (LASIX) 40 MG tablet.  Please call back.

## 2011-12-05 NOTE — Telephone Encounter (Signed)
PT CALLED BACK BECAUSE WRONG NUMBER OF TABS WERE SENT  IN FOR LASIX AND POTASSIUM

## 2011-12-06 ENCOUNTER — Ambulatory Visit (HOSPITAL_BASED_OUTPATIENT_CLINIC_OR_DEPARTMENT_OTHER): Payer: Medicare Other

## 2011-12-06 ENCOUNTER — Telehealth: Payer: Self-pay | Admitting: Cardiology

## 2011-12-06 VITALS — BP 121/65 | HR 69 | Temp 97.6°F

## 2011-12-06 DIAGNOSIS — D649 Anemia, unspecified: Secondary | ICD-10-CM | POA: Diagnosis not present

## 2011-12-06 DIAGNOSIS — D509 Iron deficiency anemia, unspecified: Secondary | ICD-10-CM

## 2011-12-06 MED ORDER — SODIUM CHLORIDE 0.9 % IV SOLN
Freq: Once | INTRAVENOUS | Status: AC
  Administered 2011-12-06: 14:00:00 via INTRAVENOUS

## 2011-12-06 MED ORDER — SODIUM CHLORIDE 0.9 % IJ SOLN
10.0000 mL | INTRAMUSCULAR | Status: DC | PRN
  Filled 2011-12-06: qty 10

## 2011-12-06 MED ORDER — HEPARIN SOD (PORK) LOCK FLUSH 100 UNIT/ML IV SOLN
250.0000 [IU] | Freq: Once | INTRAVENOUS | Status: DC | PRN
  Filled 2011-12-06: qty 5

## 2011-12-06 MED ORDER — ALTEPLASE 2 MG IJ SOLR
2.0000 mg | Freq: Once | INTRAMUSCULAR | Status: DC | PRN
  Filled 2011-12-06: qty 2

## 2011-12-06 MED ORDER — SODIUM CHLORIDE 0.9 % IV SOLN
1020.0000 mg | Freq: Once | INTRAVENOUS | Status: AC
  Administered 2011-12-06: 1020 mg via INTRAVENOUS
  Filled 2011-12-06: qty 34

## 2011-12-06 MED ORDER — SODIUM CHLORIDE 0.9 % IJ SOLN
3.0000 mL | Freq: Once | INTRAMUSCULAR | Status: DC | PRN
  Filled 2011-12-06: qty 10

## 2011-12-06 MED ORDER — HEPARIN SOD (PORK) LOCK FLUSH 100 UNIT/ML IV SOLN
500.0000 [IU] | Freq: Once | INTRAVENOUS | Status: DC | PRN
  Filled 2011-12-06: qty 5

## 2011-12-06 NOTE — Patient Instructions (Signed)
Ferumoxytol injection What is this medicine? FERUMOXYTOL is an iron complex. Iron is used to make healthy red blood cells, which carry oxygen and nutrients throughout the body. This medicine is used to treat iron deficiency anemia in people with chronic kidney disease. This medicine may be used for other purposes; ask your health care provider or pharmacist if you have questions. What should I tell my health care provider before I take this medicine? They need to know if you have any of these conditions: -anemia not caused by low iron levels -high levels of iron in the blood -magnetic resonance imaging (MRI) test scheduled -an unusual or allergic reaction to iron, other medicines, foods, dyes, or preservatives -pregnant or trying to get pregnant -breast-feeding How should I use this medicine? This medicine is for infusion into a vein. It is given by a health care professional in a hospital or clinic setting. Talk to your pediatrician regarding the use of this medicine in children. Special care may be needed. Overdosage: If you think you've taken too much of this medicine contact a poison control center or emergency room at once. Overdosage: If you think you have taken too much of this medicine contact a poison control center or emergency room at once. NOTE: This medicine is only for you. Do not share this medicine with others. What if I miss a dose? It is important not to miss your dose. Call your doctor or health care professional if you are unable to keep an appointment. What may interact with this medicine? This medicine may interact with the following medications: -other iron products This list may not describe all possible interactions. Give your health care provider a list of all the medicines, herbs, non-prescription drugs, or dietary supplements you use. Also tell them if you smoke, drink alcohol, or use illegal drugs. Some items may interact with your medicine. What should I watch  for while using this medicine? Visit your doctor or healthcare professional regularly. Tell your doctor or healthcare professional if your symptoms do not start to get better or if they get worse. You may need blood work done while you are taking this medicine. You may need to follow a special diet. Talk to your doctor. Foods that contain iron include: whole grains/cereals, dried fruits, beans, or peas, leafy green vegetables, and organ meats (liver, kidney). What side effects may I notice from receiving this medicine? Side effects that you should report to your doctor or health care professional as soon as possible: -allergic reactions like skin rash, itching or hives, swelling of the face, lips, or tongue -breathing problems -changes in blood pressure -feeling faint or lightheaded, falls -fever or chills -flushing, sweating, or hot feelings -swelling of the ankles or feet Side effects that usually do not require medical attention (Report these to your doctor or health care professional if they continue or are bothersome.): -diarrhea -headache -nausea, vomiting -stomach pain This list may not describe all possible side effects. Call your doctor for medical advice about side effects. You may report side effects to FDA at 1-800-FDA-1088. Where should I keep my medicine? This drug is given in a hospital or clinic and will not be stored at home. NOTE: This sheet is a summary. It may not cover all possible information. If you have questions about this medicine, talk to your doctor, pharmacist, or health care provider.  2012, Elsevier/Gold Standard. (03/09/2008 9:48:25 PM) 

## 2011-12-06 NOTE — Telephone Encounter (Signed)
New msg [facility] wants to know directions for lasix-40mg  and potassium-40meq

## 2011-12-06 NOTE — Telephone Encounter (Signed)
I spoke with Tom the pharmacist at [facility] and clarified the Lasix and Potassium doses. Horton Chin RN

## 2011-12-24 DIAGNOSIS — M79609 Pain in unspecified limb: Secondary | ICD-10-CM | POA: Diagnosis not present

## 2011-12-24 DIAGNOSIS — M1A00X0 Idiopathic chronic gout, unspecified site, without tophus (tophi): Secondary | ICD-10-CM | POA: Diagnosis not present

## 2012-01-23 ENCOUNTER — Telehealth: Payer: Self-pay | Admitting: Internal Medicine

## 2012-01-23 NOTE — Telephone Encounter (Signed)
Dr.Hopper please advise, would you like to change patient to a more affordable medication

## 2012-01-23 NOTE — Telephone Encounter (Signed)
Patient has reached a "donut hole" with his insurance and is now having to pay over $100 for his Nexium. He was told by the pharmacist to ask if there may be any samples that can be given to him. Patient will not be able to get this medication for under $100 until after the beginning of 2014. Please call him back regarding this issue.

## 2012-01-23 NOTE — Telephone Encounter (Signed)
The pharmacist should be able to verify what is the least expensive  THERAPEUTICALLY EFFECTIVE alternative  on your plan ; usually it is Omeprazole 20 mg  Alternatively cheaper therapeutically equivalent options may be available from  [facility] at [phone] or  [facility] [phone] (toll-free).

## 2012-01-24 NOTE — Telephone Encounter (Signed)
I spoke with patient, patient aware samples at the front desk. Patient will contact his insurance company to check for a cheaper alternative and contact us back

## 2012-01-28 ENCOUNTER — Telehealth: Payer: Self-pay | Admitting: Internal Medicine

## 2012-01-28 MED ORDER — FREESTYLE LANCETS MISC: Status: DC

## 2012-01-28 NOTE — Telephone Encounter (Signed)
Refill: Freestyle 28g lancets. Check blood sugar daily. Qty 100. Last fill 10-21-11

## 2012-01-29 ENCOUNTER — Ambulatory Visit (HOSPITAL_BASED_OUTPATIENT_CLINIC_OR_DEPARTMENT_OTHER): Payer: Medicare Other | Admitting: Hematology & Oncology

## 2012-01-29 ENCOUNTER — Telehealth: Payer: Self-pay | Admitting: *Deleted

## 2012-01-29 ENCOUNTER — Other Ambulatory Visit (HOSPITAL_BASED_OUTPATIENT_CLINIC_OR_DEPARTMENT_OTHER): Payer: Medicare Other | Admitting: Lab

## 2012-01-29 VITALS — BP 120/67 | HR 67 | Temp 97.2°F | Ht 71.0 in | Wt 267.0 lb

## 2012-01-29 DIAGNOSIS — D509 Iron deficiency anemia, unspecified: Secondary | ICD-10-CM

## 2012-01-29 DIAGNOSIS — D649 Anemia, unspecified: Secondary | ICD-10-CM

## 2012-01-29 LAB — CBC WITH DIFFERENTIAL (CANCER CENTER ONLY)
Eosinophils Absolute: 0.2 10*3/uL (ref 0.0–0.5)
MONO#: 0.7 10*3/uL (ref 0.1–0.9)
MONO%: 8.8 % (ref 0.0–13.0)
NEUT#: 4.9 10*3/uL (ref 1.5–6.5)
Platelets: 195 10*3/uL (ref 145–400)
RBC: 4.9 10*6/uL (ref 4.20–5.70)
WBC: 7.6 10*3/uL (ref 4.0–10.0)

## 2012-01-29 LAB — IRON AND TIBC
%SAT: 20 % (ref 20–55)
Iron: 63 ug/dL (ref 42–165)
UIBC: 256 ug/dL (ref 125–400)

## 2012-01-29 LAB — FERRITIN: Ferritin: 130 ng/mL (ref 22–322)

## 2012-01-29 NOTE — Progress Notes (Signed)
This office note has been dictated.

## 2012-01-29 NOTE — Telephone Encounter (Signed)
Discuss with patient referral placed.

## 2012-01-29 NOTE — Telephone Encounter (Signed)
Message copied by Marylen Ponto on Wed Jan 29, 2012  5:08 PM ------      Message from: [name]      Created: Wed Jan 29, 2012  4:55 PM       Please ask if he is willing to be referred  to GI to evaluate the iron deficiency anemia. The records suggest he is not been seen recently but had esophageal dilations treated by Dr. Rachelle Hora on several occasions.Thanks

## 2012-01-30 ENCOUNTER — Encounter: Payer: Self-pay | Admitting: Internal Medicine

## 2012-01-30 NOTE — Progress Notes (Signed)
CC:   Richard [name]. Linna Darner, MD,FACP,FCCP  DIAGNOSES: 1. Anemia-iron deficiency. 2. Transient leukocytosis.  CURRENT THERAPY:  Patient is status post IV iron.  INTERIM HISTORY:  Richard [name] comes in for followup.  We initially saw him back in May.  When we saw him, his iron studies were on the low side.  His ferritin was only 10.  His iron saturation was 10%.  His B12 level at the time was 219.  He had a negative intrinsic factor.  We went ahead and gave him a dose of IV iron.  IV iron was given on 06/07.  He got 1020 mg.  This did seem to make him feel a little bit better.  He has been active.  He has been playing golf.  His appetite has been good.  He has had no nausea or vomiting.  He has had no rashes.  There has been no fever, sweats or chills.  PHYSICAL EXAMINATION:  GENERAL:  This is a well-developed, well- nourished white gentleman in no obvious distress.  Vital Signs: 97.2, pulse 67, respiratory rate 22, blood pressure 120/67, weight is 267. Head and neck:  Normocephalic, atraumatic skull.  There are no ocular or oral lesions.  There are no palpable cervical or supraclavicular lymph nodes.  Lungs:  Clear to percussion and auscultation bilaterally. Cardiac:  Regular rate and rhythm with a normal S1 and S2.  There are no murmurs, rubs or bruits.  Abdomen:  Soft with good bowel sounds.  There is no palpable abdominal mass.  There is no fluid wave.  There is no palpable hepatosplenomegaly.  Back:  No tenderness over the spine, ribs, or hips.  Extremities:  Shows no clubbing, cyanosis or edema. Neurological:  Shows no focal neurological deficits.  LABORATORY STUDIES:  White cell count is 7.6, hemoglobin 13.7, hematocrit 41.1, platelet count 195.  MCV is 84.  IMPRESSION:  Richard [name] is a 71 year old gentleman with anemia.  This has pretty much resolved.  We did find that he had a low iron.  I am not sure as to when his last colonoscopy was.  I want to see about Dr. Linna Darner and  possibly whether or not he has had an endoscopy.  For now, I just do not think we have to get him back to see [place].  We corrected the problem.  I am just thankful that his blood is better now.  We will certainly be more than happy to get Richard [name] back if necessary.  Again, he is to be followed closely by Dr. Linna Darner.    ______________________________ [name], M.D. PRE/MEDQ  D:  01/29/2012  T:  01/30/2012  Job:  [id number]

## 2012-02-07 ENCOUNTER — Other Ambulatory Visit: Payer: Self-pay | Admitting: Internal Medicine

## 2012-02-17 ENCOUNTER — Other Ambulatory Visit: Payer: Self-pay | Admitting: Internal Medicine

## 2012-02-27 ENCOUNTER — Other Ambulatory Visit: Payer: Self-pay | Admitting: Cardiology

## 2012-02-27 NOTE — Telephone Encounter (Signed)
Fax Received. Refill Completed. Sadiq Mccauley Chowoe (R.M.A)   

## 2012-03-04 ENCOUNTER — Ambulatory Visit: Payer: Medicare Other | Admitting: Internal Medicine

## 2012-03-09 ENCOUNTER — Ambulatory Visit (INDEPENDENT_AMBULATORY_CARE_PROVIDER_SITE_OTHER): Payer: Medicare Other | Admitting: Internal Medicine

## 2012-03-09 ENCOUNTER — Encounter: Payer: Self-pay | Admitting: Internal Medicine

## 2012-03-09 ENCOUNTER — Other Ambulatory Visit (INDEPENDENT_AMBULATORY_CARE_PROVIDER_SITE_OTHER): Payer: Medicare Other

## 2012-03-09 VITALS — BP 140/70 | HR 67 | Ht 71.5 in | Wt 273.5 lb

## 2012-03-09 DIAGNOSIS — E538 Deficiency of other specified B group vitamins: Secondary | ICD-10-CM | POA: Diagnosis not present

## 2012-03-09 DIAGNOSIS — K219 Gastro-esophageal reflux disease without esophagitis: Secondary | ICD-10-CM

## 2012-03-09 DIAGNOSIS — D509 Iron deficiency anemia, unspecified: Secondary | ICD-10-CM

## 2012-03-09 MED ORDER — NA SULFATE-K SULFATE-MG SULF 17.5-3.13-1.6 GM/177ML PO SOLN: ORAL | Status: DC

## 2012-03-09 MED ORDER — CYANOCOBALAMIN 1000 MCG/ML IJ SOLN
1000.0000 ug | Freq: Once | INTRAMUSCULAR | Status: AC
  Administered 2012-03-09: 1000 ug via INTRAMUSCULAR

## 2012-03-09 NOTE — Progress Notes (Signed)
Subjective:    Patient ID: Richard [name], male    DOB: 1940-12-27, 71 y.o.   MRN: [id number] Referred by: [name], MD  HPI The patient is a very pleasant elderly white man with previous history of dye evaluation many years ago. He was found to be anemic with low iron and ferretin and low B12. He describes gas and intermittent loose stools after eating at times. At somewhat of a change in bowel habits for him. He denies any bleeding and I don't think he's been Hemoccult-positive. He has some heartburn at times as well though he is on Nexium, and that generally relieve things. He denies dysphagia or unintentional weight loss. He describes a hunger-like pain in the epigastrium on intermittent basis as well. The loose stools generally occur after eating fatty and or spicy foods. Allergies  Allergen Reactions  . Benazepril     angioedema; he is not a candidate for any angiotensin receptor blockers because of this significant allergic reaction   Outpatient Prescriptions Prior to Visit  Medication Sig Dispense Refill  . allopurinol (ZYLOPRIM) 300 MG tablet Take 300 mg by mouth as directed. 1 1/2 by mouth daily      . amLODipine (NORVASC) 5 MG tablet TAKE ONE TABLET BY MOUTH ONE TIME DAILY  90 tablet  3  . Ascorbic Acid (VITAMIN C) 500 MG tablet Take 500 mg by mouth daily.        [name] aspirin 81 MG tablet Take 81 mg by mouth daily.        [name] atorvastatin (LIPITOR) 80 MG tablet Take 1 tablet (80 mg total) by mouth at bedtime.  90 tablet  2  . Calcium Citrate-Vitamin D (CITRACAL PETITES/VITAMIN D) 200-250 MG-UNIT TABS Take 500 mg by mouth every morning.       . ezetimibe (ZETIA) 10 MG tablet Take 1 tablet (10 mg total) by mouth daily.  30 tablet  11  . fexofenadine (ALLEGRA) 180 MG tablet Take 180 mg by mouth. 1/2 am, 1/2 pm      . fluticasone (FLONASE) 50 MCG/ACT nasal spray USE ONE SPRAY IN EACH NOSTRIL TWICE DAILY AS NEEDED  16 g  5  . FREESTYLE LITE test strip CHECK FASTING BLOOD SUGAR ONCE  DAILY AS DIRECTED.  100 each  3  . glimepiride (AMARYL) 2 MG tablet TAKE ONE TABLET BY MOUTH EVERY MORNING  90 tablet  1  . ipratropium-albuterol (DUONEB) 0.5-2.5 (3) MG/3ML SOLN Every 4-6 hours as needed  360 mL  1  . Lancet Device MISC by Does not apply route. Freestyle, check blood sugar daily       . Lancets (FREESTYLE) lancets CHECK BLOOD SUGAR DAILY.DX 250.00  100 each  3  . metoprolol succinate (TOPROL-XL) 100 MG 24 hr tablet TAKE ONE AND ONE-HALF TABLETS BY MOUTH DAILY  135 tablet  3  . montelukast (SINGULAIR) 10 MG tablet Take 1 tablet (10 mg total) by mouth daily as needed.  30 tablet  0  . NEXIUM 40 MG capsule TAKE ONE CAPSULE BY MOUTH ONE TIME DAILY  30 each  5  . nitroGLYCERIN (NITROSTAT) 0.4 MG SL tablet DISSOLVE 1 TAB UNDER TONGUE EVERY 5 MINUTES AS NEEDED FOR CHEST PAIN UP TO 3 DOSES.  25 tablet  9  . Probiotic Product (ALIGN) 4 MG CAPS Take by mouth daily as needed.        [name] Respiratory Therapy Supplies (NEBULIZER COMPRESSOR) KIT by Does not apply route. Use with Proventil once daily       .  furosemide (LASIX) 40 MG tablet Take 1 tablet (40 mg total) by mouth daily. 2 tabs q AM and 1 tab q PM  270 tablet  3  . potassium chloride SA (K-DUR,KLOR-CON) 20 MEQ tablet Take 1 tablet (20 mEq total) by mouth as directed. 2 tabs q AM and 1 tab q PM  270 tablet  3  . gabapentin (NEURONTIN) 100 MG capsule take 1 capsule by mouth at bedtime  30 capsule  5  . traMADol (ULTRAM) 50 MG tablet Take 1 tablet (50 mg total) by mouth every 6 (six) hours as needed for pain. Maximum dose= 8 tablets per day  30 tablet  1   Past Medical History  Diagnosis Date  . Gout   . Esophageal stricture     3-4x  . CAD (coronary artery disease)   . Mixed hyperlipidemia   . Sliding hiatal hernia   . GERD (gastroesophageal reflux disease)   . Peptic stricture of esophagus   . Antritis (stomach)     mild  . Duodenitis     chronic  . Diverticulosis     mild, left colon  . Anemia   . DM (diabetes mellitus)     . HTN (hypertension)   . Sleep apnea    Past Surgical History  Procedure Date  . Angioplasty 1993  . Coronary angioplasty with stent placement 05/1997  . Vasectomy   . Coronary artery bypass graft 03/2000    5 vessel  . Esophageal dilation     Dr. Lyla Son  . Tonsillectomy   . Appendectomy   . Esophagogastroduodenoscopy     multiple  . Flexible sigmoidoscopy     multiple  . Panendoscopy   . Meniscus repair 2011    left knee  . Shoulder surgery 08/2010    right, screws placed   History   Social History  . Marital Status: Divorced    Spouse Name: N/A    Number of Children: 2  . Years of Education: N/A   Occupational History  . 2    Social History Main Topics  . Smoking status: Never Smoker   . Smokeless tobacco: Never Used  . Alcohol Use: No  . Drug Use: No  . Sexually Active: None   Other Topics Concern  . 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   Family History  Problem Relation Age of Onset  . Stroke Father   . Hypertension Father   . Pancreatic cancer Mother   . Diabetes Maternal Grandmother   . Heart attack Paternal Grandmother   . Stroke Maternal Grandmother         Review of Systems As per history of present illness, and positive for bilateral knee and low back pain, he has urinary leakage at times and polyuria all other review of systems are negative although he uses nocturnal oxygen and CPAP. Prior to adding oxygen to the CPAP he was having significant problems with fatigue and headaches to    Objective:   Physical Exam General:  Obese, well-developed, well-nourished and in no acute distress Eyes:  Anicteric. Facies: plethoric ENT:   Mouth and posterior pharynx free of lesions.  Neck:   supple w/o  thyromegaly or mass.  Lungs: Clear to auscultation bilaterally. Heart:  S1S2, no rubs,  murmurs, gallops. Abdomen:  obese soft, non-tender, no hepatosplenomegaly, hernia, or mass and BS+.  Rectal: deferred Lymph:  no cervical or supraclavicular adenopathy. Extremities:   no edema Skin   no rash. Neuro:  A&O x 3.  Psych:  appropriate mood and  Affect.   Data Reviewed: Lab Results  Component Value Date   WBC 7.6 01/29/2012   HGB 13.7 01/29/2012   HCT 41.1 01/29/2012   MCV 84 01/29/2012   PLT 195 01/29/2012   Lab Results  Component Value Date   FERRITIN 130 01/29/2012   Lab Results  Component Value Date   VITAMINB12 219 11/22/2011          Assessment & Plan:   1. Iron deficiency anemia, unspecified  Ambulatory referral to Gastroenterology, Tissue transglutaminase, IgA, IgA  2. B12 deficiency  Ambulatory referral to Gastroenterology, Tissue transglutaminase, IgA, IgA, cyanocobalamin ((VITAMIN B-12)) injection 1,000 mcg  3. GERD (gastroesophageal reflux disease)  Ambulatory referral to Gastroenterology, Tissue transglutaminase, IgA, IgA    1. Cause of anemia with B12 and iron deficiency not clear. 2. Possibilities are PPI and acid suppression, metformin (B 12) and celiac disease 3. Will check celiac labs - NEGATIVE 4. 4) B12 1000ug today im EGD and colonoscopy - The risks, benefits, and alternatives to endoscopy with possible biopsy and possible dilation were discussed with the patient and they consent to proceed.    [id number] Linna Darner, MD

## 2012-03-09 NOTE — Patient Instructions (Addendum)
You have been scheduled for an endoscopy and colonoscopy with propofol. Please follow the written instructions given to you at your visit today. Please pick up your prep at the pharmacy within the next 1-3 days. If you use inhalers (even only as needed), please bring them with you on the day of your procedure.  Your physician has requested that you go to the basement for the following lab work before leaving today: IGA, TTG  You have been given a B-12 injection today.  Thank you for choosing me and [facility].  Gatha Mayer, M.D., [facility]

## 2012-03-10 DIAGNOSIS — E669 Obesity, unspecified: Secondary | ICD-10-CM | POA: Diagnosis not present

## 2012-03-10 DIAGNOSIS — G4733 Obstructive sleep apnea (adult) (pediatric): Secondary | ICD-10-CM | POA: Diagnosis not present

## 2012-03-10 LAB — TISSUE TRANSGLUTAMINASE, IGA: Tissue Transglutaminase Ab, IgA: 3.6 U/mL

## 2012-03-13 ENCOUNTER — Encounter: Payer: Self-pay | Admitting: Internal Medicine

## 2012-03-18 ENCOUNTER — Other Ambulatory Visit: Payer: Self-pay | Admitting: Internal Medicine

## 2012-03-23 DIAGNOSIS — Z23 Encounter for immunization: Secondary | ICD-10-CM | POA: Diagnosis not present

## 2012-04-17 ENCOUNTER — Other Ambulatory Visit: Payer: Self-pay | Admitting: Internal Medicine

## 2012-04-30 ENCOUNTER — Ambulatory Visit (AMBULATORY_SURGERY_CENTER): Payer: Medicare Other | Admitting: Internal Medicine

## 2012-04-30 ENCOUNTER — Encounter: Payer: Self-pay | Admitting: Internal Medicine

## 2012-04-30 VITALS — BP 127/67 | HR 61 | Temp 97.6°F | Resp 22 | Ht 71.5 in | Wt 273.0 lb

## 2012-04-30 DIAGNOSIS — D126 Benign neoplasm of colon, unspecified: Secondary | ICD-10-CM | POA: Diagnosis not present

## 2012-04-30 DIAGNOSIS — K5730 Diverticulosis of large intestine without perforation or abscess without bleeding: Secondary | ICD-10-CM

## 2012-04-30 DIAGNOSIS — D509 Iron deficiency anemia, unspecified: Secondary | ICD-10-CM

## 2012-04-30 DIAGNOSIS — K2970 Gastritis, unspecified, without bleeding: Secondary | ICD-10-CM

## 2012-04-30 DIAGNOSIS — I2510 Atherosclerotic heart disease of native coronary artery without angina pectoris: Secondary | ICD-10-CM | POA: Diagnosis not present

## 2012-04-30 DIAGNOSIS — D649 Anemia, unspecified: Secondary | ICD-10-CM | POA: Diagnosis not present

## 2012-04-30 DIAGNOSIS — G4733 Obstructive sleep apnea (adult) (pediatric): Secondary | ICD-10-CM | POA: Diagnosis not present

## 2012-04-30 DIAGNOSIS — K2990 Gastroduodenitis, unspecified, without bleeding: Secondary | ICD-10-CM

## 2012-04-30 LAB — GLUCOSE, CAPILLARY: Glucose-Capillary: 119 mg/dL — ABNORMAL HIGH (ref 70–99)

## 2012-04-30 MED ORDER — CYANOCOBALAMIN 1000 MCG PO TABS: 1000.0000 ug | ORAL_TABLET | Freq: Every day | ORAL | Status: DC

## 2012-04-30 MED ORDER — SODIUM CHLORIDE 0.9 % IV SOLN: 500.0000 mL | INTRAVENOUS | Status: DC

## 2012-04-30 NOTE — Op Note (Signed)
[place]  [address] [place], [id number]   COLONOSCOPY PROCEDURE REPORT  PATIENT: Richard [name], Richard [name]  MR#: [id number] BIRTHDATE: June 27, 1941 , 71  yrs. old GENDER: Male ENDOSCOPIST: Gatha Mayer, MD, [facility] REFERRED BY: PROCEDURE DATE:  04/30/2012 PROCEDURE:   Colonoscopy with biopsy and Colonoscopy with snare polypectomy ASA CLASS:   Class III INDICATIONS:iron deficiency anemia. MEDICATIONS: There was residual sedation effect present from prior procedure, Propofol (Diprivan) 160 mg IV, MAC sedation, administered by CRNA, and These medications were titrated to patient response per physician's verbal order  DESCRIPTION OF PROCEDURE:   After the risks benefits and alternatives of the procedure were thoroughly explained, informed consent was obtained.  A digital rectal exam revealed no abnormalities of the rectum and A digital rectal exam revealed the prostate was not enlarged.   The LB [id number]  endoscope was introduced through the anus and advanced to the cecum, which was identified by both the appendix and ileocecal valve. No adverse events experienced.   The quality of the prep was Suprep good  The instrument was then slowly withdrawn as the colon was fully examined.      COLON FINDINGS: Three polypoid shaped sessile polyps measuring 3-10 mm in size were found in the transverse colon.  A polypectomy was performed with cold forceps and using snare cautery.  The resection was complete and the polyp tissue was completely retrieved. Moderate diverticulosis was noted in the sigmoid colon.   The colon mucosa was otherwise normal.  Retroflexed views revealed no abnormalities. The time to cecum=4 minutes 04 seconds.  Withdrawal time=13 minutes 17 seconds.  The scope was withdrawn and the procedure completed. COMPLICATIONS: There were no complications.  ENDOSCOPIC IMPRESSION: 1.   Three sessile polyps measuring 3-10 mm in size were found in the  transverse colon; polypectomy was performed with cold forceps and using snare cautery 2.   Moderate diverticulosis was noted in the sigmoid colon 3.   The colon mucosa was otherwise normal  RECOMMENDATIONS: Hold aspirin, aspirin products, and anti-inflammatory medication for 2 weeks. Start vitamin B12 1000 ug daily po see Dr. Linna Darner in follow-up of anemia before end of year  eSigned:  Gatha Mayer, MD, [facility] 04/30/2012 9:14 AM   cc: [name], MD and The Patient

## 2012-04-30 NOTE — Patient Instructions (Addendum)
The stomach was inflamed - called gastritis. Biopsies taken to understand why. Three colon polyps removed - they look benign. Will let you know the results. Please purchase vitamin B12 1000 microgram tablets and take 1 each day. You need to make an appointment to see Dr. Linna Darner to follow-up on your anemia and any other treatment needed.  Thank you for choosing me and [facility].  Gatha Mayer, MD, FACG jYOU HAD AN ENDOSCOPIC PROCEDURE TODAY AT [facility]: Refer to the procedure report that was given to you for any specific questions about what was found during the examination.  If the procedure report does not answer your questions, please call your gastroenterologist to clarify.  If you requested that your care partner not be given the details of your procedure findings, then the procedure report has been included in a sealed envelope for you to review at your convenience later.  YOU SHOULD EXPECT: Some feelings of bloating in the abdomen. Passage of more gas than usual.  Walking can help get rid of the air that was put into your GI tract during the procedure and reduce the bloating. If you had a lower endoscopy (such as a colonoscopy or flexible sigmoidoscopy) you may notice spotting of blood in your stool or on the toilet paper. If you underwent a bowel prep for your procedure, then you may not have a normal bowel movement for a few days.  DIET: Your first meal following the procedure should be a light meal and then it is ok to progress to your normal diet.  A half-sandwich or bowl of soup is an example of a good first meal.  Heavy or fried foods are harder to digest and may make you feel nauseous or bloated.  Likewise meals heavy in dairy and vegetables can cause extra gas to form and this can also increase the bloating.  Drink plenty of fluids but you should avoid alcoholic beverages for 24 hours.  ACTIVITY: Your care partner should take you home directly after  the procedure.  You should plan to take it easy, moving slowly for the rest of the day.  You can resume normal activity the day after the procedure however you should NOT DRIVE or use heavy machinery for 24 hours (because of the sedation medicines used during the test).    SYMPTOMS TO REPORT IMMEDIATELY: A gastroenterologist can be reached at any hour.  During normal business hours, 8:30 AM to 5:00 PM Monday through Friday, call [phone].  After hours and on weekends, please call the GI answering service at [phone] who will take a message and have the physician on call contact you.   Following lower endoscopy (colonoscopy or flexible sigmoidoscopy):  Excessive amounts of blood in the stool  Significant tenderness or worsening of abdominal pains  Swelling of the abdomen that is new, acute  Fever of 100F or higher  Following upper endoscopy (EGD)  Vomiting of blood or coffee ground material  New chest pain or pain under the shoulder blades  Painful or persistently difficult swallowing  New shortness of breath  Fever of 100F or higher  Black, tarry-looking stools  FOLLOW UP: If any biopsies were taken you will be contacted by phone or by letter within the next 1-3 weeks.  Call your gastroenterologist if you have not heard about the biopsies in 3 weeks.  Our staff will call the home number listed on your records the next business day following your procedure to check  on you and address any questions or concerns that you may have at that time regarding the information given to you following your procedure. This is a courtesy call and so if there is no answer at the home number and we have not heard from you through the emergency physician on call, we will assume that you have returned to your regular daily activities without incident.  SIGNATURES/CONFIDENTIALITY: You and/or your care partner have signed paperwork which will be entered into your electronic medical record.  These  signatures attest to the fact that that the information above on your After Visit Summary has been reviewed and is understood.  Full responsibility of the confidentiality of this discharge information lies with you and/or your care-partner.   No aspirin nor NSAIDS for two weeks after this test due to risk of bleeding.

## 2012-04-30 NOTE — Progress Notes (Addendum)
Patient did not have preoperative order for IV antibiotic SSI prophylaxis. [phone]) Patient did not have preoperative order for IV antibiotic SSI prophylaxis. [phone])

## 2012-04-30 NOTE — Progress Notes (Signed)
[id number] a/ox3, pleased, report to RN

## 2012-04-30 NOTE — Op Note (Signed)
[facility]  [address] [place], [address]   ENDOSCOPY PROCEDURE REPORT  PATIENT: Richard [name], Richard [name]  MR#: [id number] BIRTHDATE: 02-05-1941 , 71  yrs. old GENDER: Male ENDOSCOPIST: Gatha Mayer, MD, [facility]  PROCEDURE DATE:  04/30/2012 PROCEDURE:  EGD w/ biopsy ASA CLASS:     Class III INDICATIONS:  iron deficiency anemia.   vitamin B12 anemia. MEDICATIONS: Propofol (Diprivan) 80 mg IV, MAC sedation, administered by CRNA, and These medications were titrated to patient response per physician's verbal order TOPICAL ANESTHETIC: Cetacaine Spray  DESCRIPTION OF PROCEDURE: After the risks benefits and alternatives of the procedure were thoroughly explained, informed consent was obtained.  The LB-GIF Q180 [id number] endoscope was introduced through the mouth and advanced to the second portion of the duodenum. Without limitations.  The instrument was slowly withdrawn as the mucosa was fully examined.      STOMACH: Moderate gastropathy was found in the gastric body and gastric antrum.  Multiple biopsies were performed using cold forceps.  Sample sent for histology.  The remainder of the upper endoscopy exam was otherwise normal. Retroflexed views revealed no abnormalities.     The scope was then withdrawn from the patient and the procedure completed.  COMPLICATIONS: There were no complications. ENDOSCOPIC IMPRESSION: 1.   Gastropathy was found in the gastric body and gastric antrum; multiple biopsies 2.   The remainder of the upper endoscopy exam was otherwise normal  RECOMMENDATIONS: 1.  Office will call with results 2.  Proceed with a Colonoscopy.    eSigned:  Gatha Mayer, MD, [facility] 04/30/2012 9:09 AM   [id number] Chanda Busing, MD and The Patient

## 2012-05-01 ENCOUNTER — Telehealth: Payer: Self-pay | Admitting: *Deleted

## 2012-05-01 NOTE — Telephone Encounter (Signed)
  Follow up Call-  Call back number 04/30/2012  Post procedure Call Back phone  # [phone] cell  Permission to leave phone message Yes     Patient questions:  Do you have a fever, pain , or abdominal swelling? no Pain Score  0 *  Have you tolerated food without any problems? yes  Have you been able to return to your normal activities? yes  Do you have any questions about your discharge instructions: Diet   no Medications  no Follow up visit  no  Do you have questions or concerns about your Care? no  Actions: * If pain score is 4 or above: No action needed, pain <4.

## 2012-05-05 ENCOUNTER — Encounter: Payer: Self-pay | Admitting: Internal Medicine

## 2012-05-05 DIAGNOSIS — Z860100 Personal history of colon polyps, unspecified: Secondary | ICD-10-CM | POA: Insufficient documentation

## 2012-05-05 NOTE — Progress Notes (Signed)
Quick Note:  Office  Call him and let him know stomach biopsies with slight inflammation and polyps are benign but pre-cancerous so needs repeat colonoscopy 3 years  [place]  3 year colon recall No letter ______

## 2012-05-11 ENCOUNTER — Other Ambulatory Visit: Payer: Self-pay | Admitting: *Deleted

## 2012-05-11 MED ORDER — ATORVASTATIN CALCIUM 80 MG PO TABS: 80.0000 mg | ORAL_TABLET | Freq: Every day | ORAL | Status: DC

## 2012-05-26 ENCOUNTER — Other Ambulatory Visit: Payer: Self-pay | Admitting: Internal Medicine

## 2012-06-02 ENCOUNTER — Ambulatory Visit (INDEPENDENT_AMBULATORY_CARE_PROVIDER_SITE_OTHER): Payer: Medicare Other | Admitting: Internal Medicine

## 2012-06-02 ENCOUNTER — Encounter: Payer: Self-pay | Admitting: Internal Medicine

## 2012-06-02 VITALS — BP 122/70 | HR 74 | Wt 270.0 lb

## 2012-06-02 DIAGNOSIS — M79609 Pain in unspecified limb: Secondary | ICD-10-CM

## 2012-06-02 DIAGNOSIS — E119 Type 2 diabetes mellitus without complications: Secondary | ICD-10-CM | POA: Diagnosis not present

## 2012-06-02 DIAGNOSIS — M79671 Pain in right foot: Secondary | ICD-10-CM

## 2012-06-02 MED ORDER — TRAMADOL HCL 50 MG PO TABS: 50.0000 mg | ORAL_TABLET | Freq: Four times a day (QID) | ORAL | Status: DC | PRN

## 2012-06-02 MED ORDER — TRAMADOL HCL 50 MG PO TABS: ORAL_TABLET | ORAL | Status: DC

## 2012-06-02 NOTE — Progress Notes (Signed)
Subjective:    Patient ID: Richard [name], male    DOB: 08/29/1940, 71 y.o.   MRN: [id number]  HPI #1 Extremity pain Location:both feet Onset:several years ago as nocturnal shooting pain. Also numbness and tingling in tips of toes intermittently Trigger/injury:no Pain quality: sharp Pain severity:up to 8 Duration:seconds Radiation:no Exacerbating factors:none Treatment/response:none  #2 Diabetes status assessment: Fasting or morning glucose range: 110-125   . Highest glucose 2 hours after any meal:  Not checked. Hypoglycemia :occasionally .                                                     Excess thirst :no;  Excess hunger: no ;  Excess urination:  Yes especially @ night & in am                                  Lightheadedness with standing: only if stands too quickly. Chest pain:  no ; Palpitations :no ;  Pain in  calves with walking:  No  .                                                                                                                                 Non healing skin  ulcers or sores,especially over the feet:  no.                                                                                                                                             Significant change in  Weight : stable. Vision changes : no  .                                                                    Exercise : golf 7 days / week. Nutrition/diet: "healthy". Medication compliance : yes. Medication adverse  Effects:  no . Eye exam : 18 mos.  Foot care : no but pedicure every 2 mos.         Review of Systems Constitutional: no fever, chills, sweats, change in weight  Musculoskeletal:no  muscle cramps or pain; no  joint stiffness, redness, or swelling Skin:no rash, color/temp change Neuro: no weakness; incontinence (stool/urine).  Heme:no lymphadenopathy; abnormal bruising or bleeding        Objective:   Physical Exam Gen.:  well-nourished in appearance. Alert, appropriate  and cooperative throughout exam.   Eyes: No corneal or conjunctival inflammation noted.  Neck: No deformities, masses, or tenderness noted.  Thyroid normal. Lungs: Normal respiratory effort; chest expands symmetrically. Lungs are clear to auscultation without rales, wheezes, or increased work of breathing. Heart: Normal rate and rhythm. Normal S1 and S2. No gallop, click, or rub. Grade 1/6 systolic murmur . Abdomen: Bowel sounds normal; abdomen soft and nontender. No masses, organomegaly or hernias noted.                                                                      Musculoskeletal/extremities: No deformity or scoliosis noted of  the thoracic or lumbar spine. No clubbing, cyanosis or deformity noted. Trace ankle edema .Tone & strength  normal.Joints normal. Nail health  good. Vascular: Carotid, radial artery, dorsalis pedis and  posterior tibial pulses are full and equal. No bruits present. Neurologic: Alert and oriented x3. Deep tendon reflexes symmetrical and normal.  Light touch normal over feet.         Skin: Dry but intact without suspicious lesions or rashes.Some stasis hyperpigmentation shin changes Lymph: No cervical, axillary lymphadenopathy present. Psych: Mood and affect are normal. Normally interactive                                                                                        Assessment & Plan:  #1  feet pain; ? RLS variant . Doubt DM peripheral neuropathy as only nocturnally #2 DM , ? Status Plan: See orders and recommendations

## 2012-06-02 NOTE — Patient Instructions (Addendum)
Please perform isometric exercises before going to bed. Sit on side of the bed and raise up on toes to a count of 5. Then put pressure on the heels to a count of 5. Repeat this process 10 times. This will improve blood flow to the lower extremities.

## 2012-06-03 LAB — BASIC METABOLIC PANEL
Chloride: 103 mEq/L (ref 96–112)
GFR: 76.78 mL/min (ref >60.00)
Glucose, Bld: 90 mg/dL (ref 70–99)
Potassium: 3.9 mEq/L (ref 3.5–5.1)
Sodium: 140 mEq/L (ref 135–145)

## 2012-06-03 LAB — HEMOGLOBIN A1C: Hgb A1c MFr Bld: 6.7 % — ABNORMAL HIGH (ref 4.6–6.5)

## 2012-06-03 LAB — MICROALBUMIN / CREATININE URINE RATIO
Creatinine,U: 137.2 mg/dL
Microalb Creat Ratio: 0.6 mg/g (ref 0.0–30.0)

## 2012-06-22 ENCOUNTER — Telehealth: Payer: Self-pay | Admitting: *Deleted

## 2012-06-22 ENCOUNTER — Other Ambulatory Visit: Payer: Self-pay | Admitting: Internal Medicine

## 2012-06-22 NOTE — Telephone Encounter (Signed)
Pt left VM that he would like to get lab results. Advise Pt that labs were release to mychart. Pt states that he does not have a computer and that his daughter might have active his account but he prefers a hard copy of labs everytime labs or test are done. Advise Pt that he will need to advise hopp and his assistant when he has labs or test done so that he can get a copy in mail instead of being release to Mychart. Copy of labs mailed to Pt.

## 2012-07-08 ENCOUNTER — Other Ambulatory Visit: Payer: Self-pay | Admitting: *Deleted

## 2012-07-08 MED ORDER — EZETIMIBE 10 MG PO TABS: 10.0000 mg | ORAL_TABLET | Freq: Every day | ORAL | Status: DC

## 2012-07-21 ENCOUNTER — Encounter: Payer: Self-pay | Admitting: Lab

## 2012-07-22 ENCOUNTER — Ambulatory Visit (INDEPENDENT_AMBULATORY_CARE_PROVIDER_SITE_OTHER): Payer: Medicare Other | Admitting: Internal Medicine

## 2012-07-22 ENCOUNTER — Encounter: Payer: Self-pay | Admitting: Internal Medicine

## 2012-07-22 ENCOUNTER — Ambulatory Visit: Payer: Medicare Other | Admitting: Cardiology

## 2012-07-22 VITALS — BP 136/78 | HR 72 | Temp 97.6°F | Wt 268.0 lb

## 2012-07-22 DIAGNOSIS — J209 Acute bronchitis, unspecified: Secondary | ICD-10-CM | POA: Diagnosis not present

## 2012-07-22 MED ORDER — AZITHROMYCIN 250 MG PO TABS: ORAL_TABLET | ORAL | Status: DC

## 2012-07-22 MED ORDER — HYDROCODONE-HOMATROPINE 5-1.5 MG/5ML PO SYRP: 5.0000 mL | ORAL_SOLUTION | Freq: Four times a day (QID) | ORAL | Status: DC | PRN

## 2012-07-22 NOTE — Progress Notes (Signed)
  Subjective:    Patient ID: Richard [name], male    DOB: [birth year], 72 y.o.   MRN: [id number]  HPI  The respiratory tract symptoms began 74/19/14 as sore throat , chest congestion , cough with  yellow  sputum.  Significant active  associated symptoms include persistent sore throat & productive cough  associated with   wheezing .  Flu shot  current     Mild extrinsic symptoms of itchy, watery eyes were present .   Treatment with  Neti pot, Mucinex DM, Tylenol was partially effective   There is no history of asthma. The patient had never smoked .                   Review of Systems Symptoms not present include frontal headache, facial pain, dental pain,  nasal purulence, earache , and otic discharge  Fever,chills & sweats not present   Myalgias and arthralgias were not present      Objective:   Physical Exam  General appearance:good health ;well nourished; no acute distress or increased work of breathing is present.  No  lymphadenopathy about the head, neck, or axilla noted.  Eyes: No conjunctival inflammation or lid edema is present.  Ears:  External ear exam shows no significant lesions or deformities.  Otoscopic examination reveals wax on L right  tympanic membrane intact  without bulging, retraction, inflammation or discharge. Nose:  External nasal examination shows no deformity or inflammation. Nasal mucosa are pink and moist without lesions or exudates. No septal dislocation or deviation.No obstruction to airflow.  Oral exam: Dental hygiene is good; lips and gums are healthy appearing.There is mild oropharyngeal erythema ; no exudate noted.  Neck:  No deformities, masses, or tenderness noted.  Bilateral lipomatous changes @ neck base Heart:  Normal rate and regular rhythm. S1 and S2 normal without gallop,  click, rub or other extra sounds. Grade 1/6 systolic murmur  Lungs:Chest clear to auscultation; no wheezes, rhonchi,rales ,or rubs present.No increased work  of breathing.   Extremities:  No cyanosis or clubbing  noted . Traceedema Skin: Warm & dry         Assessment & Plan:  #1 acute bronchitis w/o bronchospasm Plan: See orders and recommendations

## 2012-07-22 NOTE — Patient Instructions (Addendum)
Plain Mucinex (NOT D) for thick secretions ;force NON dairy fluids .   Nasal cleansing in the shower as discussed with lather of mild shampoo.After 10 seconds wash off lather while  exhaling through nostrils. Make sure that all residual soap is removed to prevent irritation.  Fluticasone 1 spray in each nostril twice a day as needed. Use the "crossover" technique into opposite nostril spraying toward opposite ear @ 45 degree angle, not straight up into nostril.  Use a Neti pot daily only  as needed for significant sinus congestion; going from open side to congested side . Plain Allegra (NOT D )  160 daily , Loratidine 10 mg , OR Zyrtec 10 mg @ bedtime  as needed for itchy eyes & sneezing.     

## 2012-07-29 ENCOUNTER — Ambulatory Visit: Payer: Medicare Other | Admitting: Cardiology

## 2012-07-29 ENCOUNTER — Encounter: Payer: Self-pay | Admitting: Cardiology

## 2012-07-29 ENCOUNTER — Ambulatory Visit (INDEPENDENT_AMBULATORY_CARE_PROVIDER_SITE_OTHER): Payer: Medicare Other | Admitting: Cardiology

## 2012-07-29 VITALS — BP 142/70 | HR 65 | Ht 72.0 in | Wt 271.0 lb

## 2012-07-29 DIAGNOSIS — G4730 Sleep apnea, unspecified: Secondary | ICD-10-CM

## 2012-07-29 DIAGNOSIS — E119 Type 2 diabetes mellitus without complications: Secondary | ICD-10-CM

## 2012-07-29 DIAGNOSIS — R609 Edema, unspecified: Secondary | ICD-10-CM

## 2012-07-29 DIAGNOSIS — E782 Mixed hyperlipidemia: Secondary | ICD-10-CM | POA: Diagnosis not present

## 2012-07-29 DIAGNOSIS — I25810 Atherosclerosis of coronary artery bypass graft(s) without angina pectoris: Secondary | ICD-10-CM

## 2012-07-29 DIAGNOSIS — I10 Essential (primary) hypertension: Secondary | ICD-10-CM

## 2012-07-29 NOTE — Assessment & Plan Note (Signed)
Last hemoglobin A1c was 6.7. Perhaps his triglycerides will be less than 500.

## 2012-07-29 NOTE — Assessment & Plan Note (Signed)
He is overdue lipids. We'll obtain fasting lipids tomorrow.

## 2012-07-29 NOTE — Progress Notes (Signed)
HPI [name] returns today for evaluation and management his coronary artery disease. He is still playing golf and shooting his age and below 72! He denies any angina or chest discomfort. He is having no dyspnea on exertion. His weight has remained fairly stable and he may have lost some.  Last blood work showed a hemoglobin A1c of 6.7%. He is overdue lipids and always runs very high triglycerides.  He denies orthopnea, PND and has had very little edema on Lasix. He seems to be compliant with his medications.  Past Medical History  Diagnosis Date  . Gout   . Esophageal stricture     3-4x  . CAD (coronary artery disease)   . Mixed hyperlipidemia   . GERD (gastroesophageal reflux disease)   . Peptic stricture of esophagus   . Antritis (stomach)     mild  . Duodenitis     chronic  . Diverticulosis     mild, left colon  . Anemia   . DM (diabetes mellitus)   . HTN (hypertension)   . Sleep apnea   . Sliding hiatal hernia   . Allergy   . Arthritis     bil knees    Current Outpatient Prescriptions  Medication Sig Dispense Refill  . allopurinol (ZYLOPRIM) 300 MG tablet Take 300 mg by mouth as directed. 1 1/2 by mouth daily      . amLODipine (NORVASC) 5 MG tablet TAKE ONE TABLET BY MOUTH ONE TIME DAILY  90 tablet  3  . Ascorbic Acid (VITAMIN C) 500 MG tablet Take 500 mg by mouth daily.        [name] aspirin 81 MG tablet Take 81 mg by mouth daily.        [name] atorvastatin (LIPITOR) 80 MG tablet Take 1 tablet (80 mg total) by mouth at bedtime.  90 tablet  1  . Calcium Citrate-Vitamin D (CITRACAL PETITES/VITAMIN D) 200-250 MG-UNIT TABS Take 500 mg by mouth every morning.       . cyanocobalamin ([employer] VITAMIN B12) 1000 MCG tablet Take 1 tablet (1,000 mcg total) by mouth daily.      [name] ezetimibe (ZETIA) 10 MG tablet Take 1 tablet (10 mg total) by mouth daily.  30 tablet  2  . fexofenadine (ALLEGRA) 180 MG tablet Take 180 mg by mouth. 1/2 am, 1/2 pm       . fluticasone (FLONASE) 50 MCG/ACT nasal spray  USE ONE SPRAY IN EACH NOSTRIL TWICE DAILY AS NEEDED  16 g  5  . FREESTYLE LITE test strip CHECK FASTING BLOOD SUGAR ONCE DAILY AS DIRECTED.  100 each  3  . furosemide (LASIX) 40 MG tablet Take 40 mg by mouth 2 (two) times daily.       [name] glimepiride (AMARYL) 2 MG tablet TAKE ONE TABLET BY MOUTH EVERY MORNING  30 tablet  4  . ipratropium-albuterol (DUONEB) 0.5-2.5 (3) MG/3ML SOLN Every 4-6 hours as needed  360 mL  1  . Lancets (FREESTYLE) lancets CHECK BLOOD SUGAR DAILY.DX 250.00  100 each  3  . metoprolol succinate (TOPROL-XL) 100 MG 24 hr tablet TAKE ONE AND ONE-HALF TABLETS BY MOUTH DAILY  135 tablet  3  . nitroGLYCERIN (NITROSTAT) 0.4 MG SL tablet DISSOLVE 1 TAB UNDER TONGUE EVERY 5 MINUTES AS NEEDED FOR CHEST PAIN UP TO 3 DOSES.  25 tablet  9  . omeprazole-sodium bicarbonate (ZEGERID) 40-1100 MG per capsule Take 1 capsule by mouth daily before breakfast.      . potassium chloride SA (  K-DUR,KLOR-CON) 20 MEQ tablet Take 20 mEq by mouth 2 (two) times daily.      . Probiotic Product (ALIGN) 4 MG CAPS Take by mouth daily as needed.        . Psyllium (METAMUCIL PO) Take by mouth 2 (two) times daily.      [name] Respiratory Therapy Supplies (NEBULIZER COMPRESSOR) KIT by Does not apply route. Use with Proventil once daily      . traMADol (ULTRAM) 50 MG tablet 1/2-1 qhs  30 tablet  2    Allergies  Allergen Reactions  . Benazepril     angioedema; he is not a candidate for any angiotensin receptor blockers because of this significant allergic reaction    Family History  Problem Relation Age of Onset  . Stroke Father   . Hypertension Father   . Pancreatic cancer Mother   . Diabetes Maternal Grandmother   . Stroke Maternal Grandmother   . Heart attack Paternal Grandmother   . Colon cancer Neg Hx   . Esophageal cancer Neg Hx   . Rectal cancer Neg Hx   . Stomach cancer Neg Hx     History   Social History  . Marital Status: Divorced    Spouse Name: N/A    Number of Children: 2  . Years of  Education: N/A   Occupational History  . 2    Social History Main Topics  . Smoking status: Never Smoker   . Smokeless tobacco: Never Used  . Alcohol Use: Yes     Comment: rarely  . Drug Use: No  . Sexually Active: Not on file   Other Topics Concern  . 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    ROS ALL NEGATIVE EXCEPT THOSE NOTED IN HPI  PE  General Appearance: well developed, well nourished in no acute distress, overweight but muscular, looks younger than stated age 72: symmetrical face, PERRLA, good dentition  Neck: no JVD, thyromegaly, or adenopathy, trachea midline Chest: symmetric without deformity Cardiac: PMI non-displaced, RRR, normal S1, S2, no gallop or murmur Lung: clear to ausculation and percussion Vascular: all pulses full without bruits  Abdominal: nondistended, nontender, good bowel sounds, no HSM, no bruits Extremities: no cyanosis, clubbing or edema, no sign of DVT, no varicosities  Skin: normal color, no rashes Neuro: alert and oriented x 3, non-focal Pysch: normal affect  EKG Sinus rhythm first-degree block no acute changes. Stable.  BMET    Component Value Date/Time   NA 140 06/02/2012 1603   K 3.9 06/02/2012 1603   CL 103 06/02/2012 1603   CO2 30 06/02/2012 1603   GLUCOSE 90 06/02/2012 1603   BUN 16 06/02/2012 1603   CREATININE 1.0 06/02/2012 1603   CREATININE 1.02 05/22/2011 1621   CALCIUM 9.3 06/02/2012 1603   GFRNONAA >60 09/06/2010 1430   GFRNONAA >60 09/06/2010 1430   GFRNONAA >60 09/06/2010 1430   GFRAA  Value: >60        The eGFR has been calculated using the MDRD equation. This calculation has not been validated in all clinical situations. eGFR's persistently <60 mL/min signify possible Chronic Kidney  Disease. 09/06/2010 1430   GFRAA  Value: >60        The eGFR has been calculated using the MDRD equation. This  calculation has not been validated in all clinical situations. eGFR's persistently <60 mL/min signify possible Chronic Kidney Disease. 09/06/2010 1430   GFRAA  Value: >60        The eGFR has been calculated using the MDRD equation. This calculation has not been validated in all clinical situations. eGFR's persistently <60 mL/min signify possible Chronic Kidney Disease. 09/06/2010 1430    Lipid Panel     Component Value Date/Time   CHOL 160 07/16/2011 0833   TRIG 583.0 Triglyceride is over 400; calculations on Lipids are invalid.* 07/16/2011 0833   HDL 33.80* 07/16/2011 0833   CHOLHDL 5 07/16/2011 0833   VLDL 116.6* 07/16/2011 0833   LDLCALC 84 02/02/2007 0838    CBC    Component Value Date/Time   WBC 7.6 01/29/2012 1415   WBC 4.1* 10/03/2011 1639   RBC 4.77 10/03/2011 1639   HGB 13.7 01/29/2012 1415   HGB 12.8* 10/03/2011 1639   HCT 41.1 01/29/2012 1415   HCT 39.3 10/03/2011 1639   PLT 195 01/29/2012 1415   PLT 289.0 10/03/2011 1639   MCV 84 01/29/2012 1415   MCV 82.5 10/03/2011 1639   MCH 28.0 01/29/2012 1415   MCH 25.7* 05/22/2011 1621   MCHC 33.3 01/29/2012 1415   MCHC 32.5 10/03/2011 1639   RDW 18.2* 01/29/2012 1415   RDW 19.7* 10/03/2011 1639   LYMPHSABS 1.8 01/29/2012 1415   LYMPHSABS 2.2 10/03/2011 1639   MONOABS 0.7 10/03/2011 1639   EOSABS 0.2 01/29/2012 1415   EOSABS 0.1 10/03/2011 1639   BASOSABS 0.0 01/29/2012 1415   BASOSABS 0.0 10/03/2011 1639

## 2012-07-29 NOTE — Assessment & Plan Note (Signed)
He is doing well without any symptoms or limitations. EKG is stable. He is 11 years out from surgery. We'll followup again in one year.

## 2012-07-29 NOTE — Patient Instructions (Signed)
Have fasting lab work drawn Monday 08/03/12. Nothing to eat after supper. Okay to drink water, black coffee  Your physician recommends that you continue on your current medications as directed. Please refer to the Current Medication list given to you today.  Your physician wants you to follow-up in: 1 year. You will receive a reminder letter in the mail two months in advance. If you don't receive a letter, please call our office to schedule the follow-up appointment.

## 2012-07-29 NOTE — Assessment & Plan Note (Signed)
Better today than usual. Continued exercise and weight control.

## 2012-07-29 NOTE — Assessment & Plan Note (Signed)
Improved. No change in diuretic.

## 2012-08-03 ENCOUNTER — Other Ambulatory Visit (INDEPENDENT_AMBULATORY_CARE_PROVIDER_SITE_OTHER): Payer: Medicare Other

## 2012-08-03 DIAGNOSIS — E782 Mixed hyperlipidemia: Secondary | ICD-10-CM | POA: Diagnosis not present

## 2012-08-03 LAB — LIPID PANEL
HDL: 28.4 mg/dL — ABNORMAL LOW (ref >39.00)
Total CHOL/HDL Ratio: 4
VLDL: 44 mg/dL — ABNORMAL HIGH (ref 0.0–40.0)

## 2012-08-03 LAB — HEPATIC FUNCTION PANEL
Alkaline Phosphatase: 145 U/L — ABNORMAL HIGH (ref 39–117)
Bilirubin, Direct: 0 mg/dL (ref 0.0–0.3)
Total Bilirubin: 0.4 mg/dL (ref 0.3–1.2)
Total Protein: 7.2 g/dL (ref 6.0–8.3)

## 2012-08-11 DIAGNOSIS — M79609 Pain in unspecified limb: Secondary | ICD-10-CM | POA: Diagnosis not present

## 2012-08-11 DIAGNOSIS — Q6689 Other  specified congenital deformities of feet: Secondary | ICD-10-CM | POA: Diagnosis not present

## 2012-10-07 ENCOUNTER — Other Ambulatory Visit: Payer: Self-pay | Admitting: Cardiology

## 2012-10-14 ENCOUNTER — Encounter: Payer: Self-pay | Admitting: Lab

## 2012-10-15 ENCOUNTER — Ambulatory Visit (INDEPENDENT_AMBULATORY_CARE_PROVIDER_SITE_OTHER): Payer: Medicare Other | Admitting: Internal Medicine

## 2012-10-15 ENCOUNTER — Encounter: Payer: Self-pay | Admitting: Internal Medicine

## 2012-10-15 VITALS — BP 134/78 | HR 64 | Temp 97.7°F | Wt 277.6 lb

## 2012-10-15 DIAGNOSIS — E119 Type 2 diabetes mellitus without complications: Secondary | ICD-10-CM | POA: Diagnosis not present

## 2012-10-15 DIAGNOSIS — K219 Gastro-esophageal reflux disease without esophagitis: Secondary | ICD-10-CM | POA: Diagnosis not present

## 2012-10-15 LAB — CBC WITH DIFFERENTIAL/PLATELET
Basophils Relative: 0.5 % (ref 0.0–3.0)
Eosinophils Absolute: 0.2 10*3/uL (ref 0.0–0.7)
Eosinophils Relative: 3.1 % (ref 0.0–5.0)
HCT: 42 % (ref 39.0–52.0)
Hemoglobin: 13.8 g/dL (ref 13.0–17.0)
MCHC: 32.8 g/dL (ref 30.0–36.0)
MCV: 85.8 fl (ref 78.0–100.0)
Monocytes Absolute: 0.7 10*3/uL (ref 0.1–1.0)
Neutro Abs: 4.9 10*3/uL (ref 1.4–7.7)
Neutrophils Relative %: 62.8 % (ref 43.0–77.0)
RBC: 4.9 Mil/uL (ref 4.22–5.81)
WBC: 7.9 10*3/uL (ref 4.5–10.5)

## 2012-10-15 LAB — HEMOGLOBIN A1C: Hgb A1c MFr Bld: 7 % — ABNORMAL HIGH (ref 4.6–6.5)

## 2012-10-15 MED ORDER — NITROGLYCERIN 0.4 MG SL SUBL: SUBLINGUAL_TABLET | SUBLINGUAL | Status: DC

## 2012-10-15 MED ORDER — ESOMEPRAZOLE MAGNESIUM 40 MG PO CPDR: 40.0000 mg | DELAYED_RELEASE_CAPSULE | Freq: Every day | ORAL | Status: AC

## 2012-10-15 NOTE — Patient Instructions (Addendum)
Reflux of gastric acid may be asymptomatic as this may occur mainly during sleep.The triggers for reflux  include stress; the "aspirin family" ; alcohol; peppermint; and caffeine (coffee, tea, cola, and chocolate). The aspirin family would include aspirin and the nonsteroidal agents such as ibuprofen &  Naproxen. Tylenol would not cause reflux. If having symptoms ; food & drink should be avoided for @ least 2 hours before going to bed.  Please complete and return stool cards; these will determine whether there is any gastrointestinal bleeding risk.

## 2012-10-15 NOTE — Assessment & Plan Note (Signed)
A1c & urine microalbumin  

## 2012-10-15 NOTE — Progress Notes (Signed)
  Subjective:    Patient ID: Richard [name], male    DOB: Aug 16, 1940, 72 y.o.   MRN: [id number]  HPI  He describes dull epigastric pain up to level IV intermittently over the last 3 weeks. This is associated with dyspepsia which is worse when his stomach is empty and shortly after eating. He denies no specific food triggers. He rarely drinks alcohol; has never smoked. He has minimal caffeine in the form of occasional chocolate. He is not on nonsteroidals; he does take 81 mg of aspirin daily. He has been taking Gaviscon for increased gas  He was on Nexium until September 2013 when he entered the "doughnut hole". The Nexium when cost of $45 increased  to a cost of $110 per month. He started taking Zegerid over-the-counter at that time.   Past medical history   positive  for gastritis & colon polyps in 2013. Remotely he had esophageal dilation 3-4X.His mother had pancreatic cancer.         Review of Systems Nausea vomiting, constipation , diarrhea, melena or rectal bleeding were not described. There was no associated dysphagia, anorexia or hematemesis. No significant weight change noted. Fever,chills,or sweats were not present. Dysuria, pyuria, and hematuria were absent. There was no associated rash or radicular pain in the area of the discomfort.  He denies exertional chest pain or dyspnea. He received an excellent report from his cardiologist in January.  Glucoses have been elevated recently with a fasting glucose range of 107-148. Two-hour postprandial glucoses have been as high as 201.      Objective:   Physical Exam General appearance is one of good  nourishment w/o distress. Eyes: No conjunctival inflammation or scleral icterus is present. Oral exam: Dental hygiene is good; lips and gums are healthy appearing.There is uvular erythema w/o exudate noted.  Heart:  Normal rate and regular rhythm. S1 and S2 normal with grade 1systolic murmur. No click, rub or other extra sounds  . Lungs:Chest clear to auscultation; no wheezes, rhonchi,rales ,or rubs present.No increased work of breathing.  Abdomen: bowel sounds normal, soft and non-tender without masses,or  organomegaly . Ventral hernia noted.  No guarding or rebound. Rectal : not performed ; given FOB cards  MS: no cyanosis or  Clubbing. Trace edema Skin:Warm & dry.  Intact without suspicious lesions or rashes ; no jaundice or tenting. Lymphatic: No lymphadenopathy is noted about the head, neck, or axilla.          Assessment & Plan:

## 2012-10-15 NOTE — Assessment & Plan Note (Signed)
Restart Nexium; check CBC & dif

## 2012-10-19 DIAGNOSIS — Z79899 Other long term (current) drug therapy: Secondary | ICD-10-CM | POA: Diagnosis not present

## 2012-10-29 ENCOUNTER — Encounter: Payer: Self-pay | Admitting: Internal Medicine

## 2012-11-05 ENCOUNTER — Other Ambulatory Visit (INDEPENDENT_AMBULATORY_CARE_PROVIDER_SITE_OTHER): Payer: Medicare Other

## 2012-11-05 ENCOUNTER — Other Ambulatory Visit: Payer: Self-pay | Admitting: Cardiology

## 2012-11-05 DIAGNOSIS — Z1289 Encounter for screening for malignant neoplasm of other sites: Secondary | ICD-10-CM

## 2012-11-05 DIAGNOSIS — Z0000 Encounter for general adult medical examination without abnormal findings: Secondary | ICD-10-CM | POA: Diagnosis not present

## 2012-11-05 LAB — HEMOCCULT GUIAC POC 1CARD (OFFICE)
Card #3 Fecal Occult Blood, POC: NEGATIVE
Fecal Occult Blood, POC: NEGATIVE

## 2012-11-18 ENCOUNTER — Other Ambulatory Visit: Payer: Self-pay | Admitting: Internal Medicine

## 2012-11-25 ENCOUNTER — Other Ambulatory Visit: Payer: Self-pay | Admitting: *Deleted

## 2012-11-25 MED ORDER — FREESTYLE LANCETS MISC: Status: DC

## 2012-11-25 MED ORDER — GLUCOSE BLOOD VI STRP: ORAL_STRIP | Status: DC

## 2012-11-25 NOTE — Telephone Encounter (Signed)
Rx sent 

## 2012-11-26 MED ORDER — GLUCOSE BLOOD VI STRP: ORAL_STRIP | Status: DC

## 2012-11-26 MED ORDER — FREESTYLE LANCETS MISC: Status: DC

## 2012-11-26 NOTE — Addendum Note (Signed)
Addended by: Valentina Gu L on: 0000000 09:52 AM   Modules accepted: Orders

## 2012-12-05 ENCOUNTER — Other Ambulatory Visit: Payer: Self-pay | Admitting: Cardiology

## 2012-12-23 DIAGNOSIS — M1710 Unilateral primary osteoarthritis, unspecified knee: Secondary | ICD-10-CM | POA: Diagnosis not present

## 2013-01-18 DIAGNOSIS — M1A00X0 Idiopathic chronic gout, unspecified site, without tophus (tophi): Secondary | ICD-10-CM | POA: Diagnosis not present

## 2013-01-18 DIAGNOSIS — M79609 Pain in unspecified limb: Secondary | ICD-10-CM | POA: Diagnosis not present

## 2013-01-19 DIAGNOSIS — M76899 Other specified enthesopathies of unspecified lower limb, excluding foot: Secondary | ICD-10-CM | POA: Diagnosis not present

## 2013-01-29 ENCOUNTER — Encounter (HOSPITAL_COMMUNITY): Payer: Self-pay | Admitting: Emergency Medicine

## 2013-01-29 ENCOUNTER — Emergency Department (HOSPITAL_COMMUNITY)
Admission: EM | Admit: 2013-01-29 | Discharge: 2013-01-29 | Disposition: A | Discharge status: Home / Self Care | Payer: Medicare Other | Attending: Emergency Medicine | Admitting: Emergency Medicine

## 2013-01-29 DIAGNOSIS — Z951 Presence of aortocoronary bypass graft: Secondary | ICD-10-CM | POA: Diagnosis not present

## 2013-01-29 DIAGNOSIS — E860 Dehydration: Secondary | ICD-10-CM | POA: Diagnosis not present

## 2013-01-29 DIAGNOSIS — N289 Disorder of kidney and ureter, unspecified: Secondary | ICD-10-CM | POA: Diagnosis not present

## 2013-01-29 DIAGNOSIS — K219 Gastro-esophageal reflux disease without esophagitis: Secondary | ICD-10-CM | POA: Diagnosis not present

## 2013-01-29 DIAGNOSIS — M109 Gout, unspecified: Secondary | ICD-10-CM | POA: Insufficient documentation

## 2013-01-29 DIAGNOSIS — M1710 Unilateral primary osteoarthritis, unspecified knee: Secondary | ICD-10-CM | POA: Insufficient documentation

## 2013-01-29 DIAGNOSIS — E119 Type 2 diabetes mellitus without complications: Secondary | ICD-10-CM | POA: Insufficient documentation

## 2013-01-29 DIAGNOSIS — E782 Mixed hyperlipidemia: Secondary | ICD-10-CM | POA: Insufficient documentation

## 2013-01-29 DIAGNOSIS — I10 Essential (primary) hypertension: Secondary | ICD-10-CM | POA: Insufficient documentation

## 2013-01-29 DIAGNOSIS — Z8719 Personal history of other diseases of the digestive system: Secondary | ICD-10-CM | POA: Insufficient documentation

## 2013-01-29 DIAGNOSIS — Z79899 Other long term (current) drug therapy: Secondary | ICD-10-CM | POA: Insufficient documentation

## 2013-01-29 DIAGNOSIS — Z9861 Coronary angioplasty status: Secondary | ICD-10-CM | POA: Insufficient documentation

## 2013-01-29 DIAGNOSIS — R252 Cramp and spasm: Secondary | ICD-10-CM | POA: Diagnosis not present

## 2013-01-29 DIAGNOSIS — Z862 Personal history of diseases of the blood and blood-forming organs and certain disorders involving the immune mechanism: Secondary | ICD-10-CM | POA: Diagnosis not present

## 2013-01-29 DIAGNOSIS — I2510 Atherosclerotic heart disease of native coronary artery without angina pectoris: Secondary | ICD-10-CM | POA: Insufficient documentation

## 2013-01-29 LAB — COMPREHENSIVE METABOLIC PANEL
ALT: 35 U/L (ref 0–53)
AST: 23 U/L (ref 0–37)
Albumin: 3.6 g/dL (ref 3.5–5.2)
Calcium: 9.1 mg/dL (ref 8.4–10.5)
Creatinine, Ser: 1.37 mg/dL — ABNORMAL HIGH (ref 0.50–1.35)
Sodium: 136 mEq/L (ref 135–145)

## 2013-01-29 LAB — POCT I-STAT, CHEM 8
BUN: 29 mg/dL — ABNORMAL HIGH (ref 6–23)
Calcium, Ion: 1.15 mmol/L (ref 1.13–1.30)
Calcium, Ion: 1.12 mmol/L — ABNORMAL LOW (ref 1.13–1.30)
Creatinine, Ser: 1.4 mg/dL — ABNORMAL HIGH (ref 0.50–1.35)
Creatinine, Ser: 1.3 mg/dL (ref 0.50–1.35)
Glucose, Bld: 146 mg/dL — ABNORMAL HIGH (ref 70–99)
HCT: 38 % — ABNORMAL LOW (ref 39.0–52.0)
Hemoglobin: 14.6 g/dL (ref 13.0–17.0)
Hemoglobin: 12.9 g/dL — ABNORMAL LOW (ref 13.0–17.0)
Potassium: 4.1 mEq/L (ref 3.5–5.1)
Sodium: 139 mEq/L (ref 135–145)
TCO2: 23 mmol/L (ref 0–100)
TCO2: 22 mmol/L (ref 0–100)

## 2013-01-29 LAB — URINALYSIS, ROUTINE W REFLEX MICROSCOPIC
Glucose, UA: NEGATIVE mg/dL
Hgb urine dipstick: NEGATIVE
Ketones, ur: NEGATIVE mg/dL
Protein, ur: NEGATIVE mg/dL
Urobilinogen, UA: 0.2 mg/dL (ref 0.0–1.0)

## 2013-01-29 LAB — CBC
MCH: 25.9 pg — ABNORMAL LOW (ref 26.0–34.0)
MCV: 81 fL (ref 78.0–100.0)
Platelets: 253 10*3/uL (ref 150–400)
RBC: 5.1 MIL/uL (ref 4.22–5.81)
RDW: 16.6 % — ABNORMAL HIGH (ref 11.5–15.5)
WBC: 12.3 10*3/uL — ABNORMAL HIGH (ref 4.0–10.5)

## 2013-01-29 MED ORDER — SODIUM CHLORIDE 0.9 % IV BOLUS (SEPSIS)
500.0000 mL | Freq: Once | INTRAVENOUS | Status: AC
  Administered 2013-01-29: 500 mL via INTRAVENOUS

## 2013-01-29 MED ORDER — SODIUM CHLORIDE 0.9 % IV SOLN
Freq: Once | INTRAVENOUS | Status: AC
  Administered 2013-01-29: 19:00:00 via INTRAVENOUS

## 2013-01-29 NOTE — ED Provider Notes (Signed)
Medical screening examination/treatment/procedure(s) were performed by non-physician practitioner and as supervising physician I was immediately available for consultation/collaboration.  Threasa Beards, MD 01/29/13 [phone]

## 2013-01-29 NOTE — ED Notes (Signed)
Patient given urinal and advised need sample.

## 2013-01-29 NOTE — ED Provider Notes (Signed)
CSN: [id number]     Arrival date & time 01/29/13  1818 History     First MD Initiated Contact with Patient 01/29/13 1822     Chief Complaint  Patient presents with  . Leg Pain   (Consider location/radiation/quality/duration/timing/severity/associated sxs/prior Treatment) Patient is a 72 y.o. male presenting with leg pain. The history is provided by the patient and medical records. No language interpreter was used.  Leg Pain Associated symptoms: no back pain, no fatigue and no fever     Richard [name] is a 72 y.o. male  with a hx of gout, CAD, GERD, arthritis, NIDDM, HTN, arthritis presents to the Emergency Department complaining of acute intermittent muscle cramps beginning yesterday afternoon after helping his daughter clean out her garage for several hours in the heat.  Pt states he also takes lasix 40mg  PO.  Pt states she drank a lot of water, but thinks that he might be dehydrated.  Pt states he plays golf every day and played this morning as well.  Pt states he called his PCP and talked to the triage RN who recommended that he come here to the ED.  Pt denies further associated symptoms.    Rubbing the cramp makes it better and nothing makes it worse.  Pt denies fever, chills, headache, neck pain, chest pain, shortness of breath, abdominal pain nausea, vomiting, diarrhea, weakness, dizziness, syncope, rash dysuria, hematuria.     Past Medical History  Diagnosis Date  . Gout   . Esophageal stricture     3-4x  . CAD (coronary artery disease)   . Mixed hyperlipidemia   . GERD (gastroesophageal reflux disease)   . Peptic stricture of esophagus   . Antritis (stomach)     mild  . Duodenitis     chronic  . Diverticulosis     mild, left colon  . Anemia   . DM (diabetes mellitus)   . HTN (hypertension)   . Sleep apnea   . Sliding hiatal hernia   . Allergy   . Arthritis     bil knees  . GERD (gastroesophageal reflux disease)    Past Surgical History  Procedure Laterality Date   . Angioplasty  1993  . Coronary angioplasty with stent placement  05/1997  . Vasectomy    . Coronary artery bypass graft  03/2000    5 vessel  . Esophageal dilation      Dr. Lyla Son  . Tonsillectomy    . Appendectomy    . Esophagogastroduodenoscopy      multiple  . Flexible sigmoidoscopy      multiple  . Panendoscopy    . Meniscus repair  2011    left knee  . Shoulder surgery  08/2010    right, screws placed  . Colonoscopy with polypectomy  2013  . Upper gi endoscopy  2013    Gastritis; Dr Carlean Purl   Family History  Problem Relation Age of Onset  . Stroke Father   . Hypertension Father   . Pancreatic cancer Mother   . Diabetes Maternal Grandmother   . Stroke Maternal Grandmother   . Heart attack Paternal Grandmother   . Colon cancer Neg Hx   . Esophageal cancer Neg Hx   . Rectal cancer Neg Hx   . Stomach cancer Neg Hx   . Ulcers Neg Hx    History  Substance Use Topics  . Smoking status: Never Smoker   . Smokeless tobacco: Never Used  . Alcohol Use: Yes  Comment: rarely    Review of Systems  Constitutional: Negative for fever, diaphoresis, appetite change, fatigue and unexpected weight change.  HENT: Negative for mouth sores and neck stiffness.   Eyes: Negative for visual disturbance.  Respiratory: Negative for cough, chest tightness, shortness of breath and wheezing.   Cardiovascular: Negative for chest pain.  Gastrointestinal: Negative for nausea, vomiting, abdominal pain, diarrhea and constipation.  Endocrine: Negative for polydipsia, polyphagia and polyuria.  Genitourinary: Negative for dysuria, urgency, frequency and hematuria.  Musculoskeletal: Negative for back pain.       Muscle cramps  Skin: Negative for rash.  Allergic/Immunologic: Negative for immunocompromised state.  Neurological: Negative for syncope, light-headedness and headaches.  Hematological: Does not bruise/bleed easily.  Psychiatric/Behavioral: Negative for sleep disturbance. The  patient is not nervous/anxious.     Allergies  Benazepril  Home Medications   Current Outpatient Rx  Name  Route  Sig  Dispense  Refill  . allopurinol (ZYLOPRIM) 300 MG tablet   Oral   Take 300 mg by mouth every morning.          [name] amLODipine (NORVASC) 5 MG tablet   Oral   Take 5 mg by mouth every morning.         . Ascorbic Acid (VITAMIN C) 500 MG tablet   Oral   Take 500 mg by mouth every morning.          [name] aspirin 81 MG tablet   Oral   Take 81 mg by mouth every morning.          [name] atorvastatin (LIPITOR) 80 MG tablet   Oral   Take 80 mg by mouth every morning.         . cyanocobalamin ([employer] VITAMIN B12) 1000 MCG tablet   Oral   Take 1 tablet (1,000 mcg total) by mouth daily.         [name] esomeprazole (NEXIUM) 40 MG capsule   Oral   Take 1 capsule (40 mg total) by mouth daily.   30 capsule   5   . ezetimibe (ZETIA) 10 MG tablet   Oral   Take 10 mg by mouth every morning.         . fexofenadine (ALLEGRA) 180 MG tablet   Oral   Take 90 mg by mouth 2 (two) times daily.          . fluticasone (FLONASE) 50 MCG/ACT nasal spray   Nasal   Place 2 sprays into the nose 2 (two) times daily as needed for rhinitis or allergies.         . furosemide (LASIX) 40 MG tablet   Oral   Take 40 mg by mouth 2 (two) times daily.          [name] glimepiride (AMARYL) 2 MG tablet   Oral   Take 2 mg by mouth daily before breakfast.         . metoprolol succinate (TOPROL-XL) 100 MG 24 hr tablet   Oral   Take 150 mg by mouth every morning.         . nitroGLYCERIN (NITROSTAT) 0.4 MG SL tablet   Sublingual   Place 0.4 mg under the tongue every 5 (five) minutes as needed for chest pain.         . potassium chloride SA (K-DUR,KLOR-CON) 20 MEQ tablet   Oral   Take 20-40 mEq by mouth 2 (two) times daily. Take 2 tablets in the morning and 1 tablet in the evening.         [name]  psyllium (HYDROCIL/METAMUCIL) 95 % PACK   Oral   Take 1 packet by mouth 2 (two) times  daily.          BP 137/65  Pulse 67  Temp(Src) 98.4 F (36.9 C) (Oral)  Resp 21  SpO2 93% Physical Exam  Nursing note and vitals reviewed. Constitutional: He is oriented to person, place, and time. He appears well-developed and well-nourished. No distress.  Awake, alert, nontoxic appearance  HENT:  Head: Normocephalic and atraumatic.  Mouth/Throat: Oropharynx is clear and moist. No oropharyngeal exudate.  Eyes: Conjunctivae and EOM are normal. Pupils are equal, round, and reactive to light. No scleral icterus.  Neck: Normal range of motion. Neck supple.  Cardiovascular: Normal rate, regular rhythm, normal heart sounds and intact distal pulses.   No murmur heard. Pulmonary/Chest: Effort normal and breath sounds normal. No respiratory distress. He has no wheezes. He has no rales.  Abdominal: Soft. Bowel sounds are normal. He exhibits no distension and no mass. There is no tenderness. There is no rebound and no guarding.  Musculoskeletal: Normal range of motion. He exhibits no edema.  No palpable muscles spasms at this time  Lymphadenopathy:    He has no cervical adenopathy.  Neurological: He is alert and oriented to person, place, and time. He exhibits normal muscle tone. Coordination normal.  Speech is clear and goal oriented Moves extremities without ataxia  Skin: Skin is warm and dry. No rash noted. He is not diaphoretic. No erythema.  Psychiatric: He has a normal mood and affect.    ED Course   Procedures (including critical care time)  Labs Reviewed  CBC - Abnormal; Notable for the following:    WBC 12.3 (*)    MCH 25.9 (*)    RDW 16.6 (*)    All other components within normal limits  COMPREHENSIVE METABOLIC PANEL - Abnormal; Notable for the following:    Glucose, Bld 197 (*)    BUN 30 (*)    Creatinine, Ser 1.37 (*)    Alkaline Phosphatase 123 (*)    GFR calc non Af Amer 49 (*)    GFR calc Af Amer 57 (*)    All other components within normal limits  POCT  I-STAT, CHEM 8 - Abnormal; Notable for the following:    BUN 29 (*)    Creatinine, Ser 1.40 (*)    Glucose, Bld 190 (*)    All other components within normal limits  POCT I-STAT, CHEM 8 - Abnormal; Notable for the following:    BUN 27 (*)    Glucose, Bld 146 (*)    Calcium, Ion 1.12 (*)    Hemoglobin 12.9 (*)    HCT 38.0 (*)    All other components within normal limits  URINALYSIS, ROUTINE W REFLEX MICROSCOPIC   ECG:  Date: 01/29/2013  Rate: 68  Rhythm: normal sinus rhythm  QRS Axis: normal  Intervals: normal  ST/T Wave abnormalities: nonspecific T wave changes  Conduction Disutrbances:none  Narrative Interpretation: inverted T wave III, nonischemic ECG, unchanged from 09/12/10  Old EKG Reviewed: unchanged    No results found. 1. Acute renal insufficiency   2. Dehydration   3. Muscle cramps     MDM  ZYMEER HEWITT presents with complaints of muscle cramps.  Concern for electrolyte imbalances patient takes Lasix. CMP with normal sodium potassium chloride and calcium; patient with elevated kidney function with elevated BUN and creatinine from May 2014. No other lab abnormalities noted. Urinalysis unremarkable.  Will give fluids  and recheck labs. Nonischemic ECG without evidence of hypokalemia/hyperkalemia.  11:29 PM Pt states he feels better and has had no further muscle cramps.  Improving a BUN and creatinine.  Recommend close followup with primary care physician.   At this time there does not appear to be any evidence of an acute emergency medical condition and the patient appears stable for discharge with appropriate outpatient follow up. Diagnosis was discussed with patient who verbalizes understanding and is agreeable to discharge. I have also discussed reasons to return immediately to the ER.  Patient expresses understanding and agrees with plan.  Pt case discussed with Dr. Alfonzo Beers who agrees with my plan.    Jarrett Soho [place], PA-C 01/29/13 [place], PA-C 01/29/13 2332

## 2013-01-29 NOTE — ED Notes (Signed)
Pt reports "leg cramps" that began yesterday after he helped his daughter move. Pt denies leg swelling or redness but does report 10/10 pain in his legs when the cramping starts. Pt does report that he may be dehydrated.  Pt reports that he takes Lasix 40 mg twice daily for his heart and denies any hx of blood clots. Pt AxO x4 and NAD noted.

## 2013-01-30 ENCOUNTER — Other Ambulatory Visit: Payer: Self-pay | Admitting: Cardiology

## 2013-02-03 ENCOUNTER — Ambulatory Visit (INDEPENDENT_AMBULATORY_CARE_PROVIDER_SITE_OTHER): Payer: Medicare Other | Admitting: Internal Medicine

## 2013-02-03 ENCOUNTER — Encounter: Payer: Self-pay | Admitting: Internal Medicine

## 2013-02-03 VITALS — BP 150/92 | HR 85 | Temp 98.0°F | Wt 270.0 lb

## 2013-02-03 DIAGNOSIS — J22 Unspecified acute lower respiratory infection: Secondary | ICD-10-CM

## 2013-02-03 DIAGNOSIS — E119 Type 2 diabetes mellitus without complications: Secondary | ICD-10-CM

## 2013-02-03 DIAGNOSIS — J988 Other specified respiratory disorders: Secondary | ICD-10-CM | POA: Diagnosis not present

## 2013-02-03 DIAGNOSIS — M109 Gout, unspecified: Secondary | ICD-10-CM | POA: Diagnosis not present

## 2013-02-03 NOTE — Assessment & Plan Note (Signed)
Uric acid

## 2013-02-03 NOTE — Patient Instructions (Addendum)
Drink Gatorade Lite instead of water if having excessive sweating. This will prevent loss of essential  Electrolytes.  If you activate the  My Chart system; lab & Xray results will be released directly  to you as soon as I review & address these through the computer. If you choose not to sign up for My Chart within 36 hours of labs being drawn; results will be reviewed & interpretation added before being copied & mailed, causing a delay in getting the results to you.If you do not receive that report within 7-10 days ,please call. Additionally you can use this system to gain direct  access to your records  if  out of town or @ an office of a  physician who is not in  the My Chart network.  This improves continuity of care & places you in control of your medical record.  Share results with all non [facility] medical staff seen

## 2013-02-03 NOTE — Assessment & Plan Note (Addendum)
Told to drink Gatorade Lite instead of water if having excessive sweating. This will prevent loss of essential  Electrolytes. Check BMET & Mg

## 2013-02-03 NOTE — Progress Notes (Signed)
Subjective:    Patient ID: Richard [name], male    DOB: [birth year], 72 y.o.   MRN: [id number]  HPI  D8/1/14 emergency room records were reviewed in detail. His problem list was updated with a summary of events.  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 He replace fluids orally with water and diet sodas. He continued to take his furosemide 40 mg twice a day.  He developed severe cramping of his extremities especially the left thigh 8/1, prompting ER visit. His creatinine was only 1.4; BUN 29 and GFR 49. He received intravenous fluids, 3 L.  Random glucose was 197.   Review of Systems His fasting blood sugars range from 114-140. Glucose 2 hours after any meal is not monitored. No documented hypoglycemia but weakness 1 day 4 hrs post lunch; symptoms resolved with food intake.                                                                                                                Exercise  as golf 7 times per week . No specific nutrition/diet followed Medication compliance is good. No medication adverse effects noted. Eye exam not current. Foot care :current.Dr Amil Amen gave shot into toe for gout 2 weeks ago  A1c/ urine microalbumin due   No excess thirst ;  excess hunger. Some excess urination reported with Lasix                             Occasional lightheadedness with standing reported No chest pain ; palpitations ; claudication described .  Some radicular pain RLE from hip to foot                                                                                                                           No non healing skin  ulcers or sores of extremities noted. No numbness or tingling or burning in feet described  Weight  Down 7-8# Occasional blurred vision but no double or loss of vision reported  .          Objective:   Physical Exam Gen.:  well-nourished in appearance. Alert, appropriate and cooperative throughout exam.  Eyes: No corneal or conjunctival inflammation noted.No icterus Neck: No deformities, masses, or tenderness noted.  Thyroid normal.No NVD @ 15 degrees Lungs: Normal respiratory effort; chest expands symmetrically. Lungs are clear to auscultation without rales, wheezes, or increased work of breathing. Heart: Normal rate and rhythm. Normal S1 and S2. No gallop, click, or rub. Grade 1/6 systolic murmur. Abdomen: Bowel sounds normal; abdomen soft and nontender. No masses, organomegaly or hernias noted. Protuberant. No HJR                                  Musculoskeletal/extremities: No clubbing, cyanosis,  or significant extremity  deformity noted. Trace edema.Tone & strength  Normal. Joints  Reveal isolated PIP fusiform changes. Nail health good.  Able to lie down & sit up w/o help. Negative SLR bilaterally Vascular: Carotid, radial artery, dorsalis pedis and  posterior tibial pulses are full and equal. No bruits present. Neurologic: Alert and oriented x3.         Skin: Intact without suspicious lesions or rashes. Lymph: No cervical, axillary lymphadenopathy present. Psych: Mood and affect are normal. Normally interactive                                                                                        Assessment & Plan:  See Current Assessment & Plan in Problem List under specific Diagnosis

## 2013-02-03 NOTE — Assessment & Plan Note (Addendum)
S/P steroid injection for gout Random glucose 197 in ER 01/29/13 A1c & urine microalbumin to assess status

## 2013-02-04 LAB — HEMOGLOBIN A1C: Hgb A1c MFr Bld: 8.2 % — ABNORMAL HIGH (ref 4.6–6.5)

## 2013-02-04 LAB — BASIC METABOLIC PANEL
BUN: 23 mg/dL (ref 6–23)
Calcium: 9.5 mg/dL (ref 8.4–10.5)
Creatinine, Ser: 1.2 mg/dL (ref 0.4–1.5)
GFR: 61.05 mL/min (ref >60.00)

## 2013-02-04 LAB — URIC ACID: Uric Acid, Serum: 5 mg/dL (ref 4.0–7.8)

## 2013-02-05 LAB — MICROALBUMIN / CREATININE URINE RATIO: Microalb, Ur: 0.5 mg/dL (ref 0.0–1.9)

## 2013-02-07 ENCOUNTER — Other Ambulatory Visit: Payer: Self-pay | Admitting: Cardiology

## 2013-02-09 DIAGNOSIS — M5430 Sciatica, unspecified side: Secondary | ICD-10-CM | POA: Diagnosis not present

## 2013-02-11 ENCOUNTER — Other Ambulatory Visit: Payer: Self-pay | Admitting: Cardiology

## 2013-02-15 DIAGNOSIS — M5430 Sciatica, unspecified side: Secondary | ICD-10-CM | POA: Diagnosis not present

## 2013-02-16 ENCOUNTER — Telehealth: Payer: Self-pay | Admitting: Internal Medicine

## 2013-02-16 ENCOUNTER — Telehealth: Payer: Self-pay

## 2013-02-16 DIAGNOSIS — E119 Type 2 diabetes mellitus without complications: Secondary | ICD-10-CM

## 2013-02-16 MED ORDER — GLUCOSE BLOOD VI STRP: ORAL_STRIP | Status: DC

## 2013-02-16 NOTE — Telephone Encounter (Signed)
Pharmacist called indicating that for ALL medicare patients diabetic supplies have to be Manually faxed with diagnosis code and specific directions (no as directed)

## 2013-02-16 NOTE — Telephone Encounter (Signed)
Rf for freestyle test strips sent to [facility] on [address]

## 2013-02-16 NOTE — Telephone Encounter (Signed)
Rion/patient [phone] called regarding refill of Freestyle test strips.  Ran out of glucometer  test strips 02/15/13.  Pharmacist told him they were ordered for once daily use so its too early to refill now.  Been testing blood sugar before meals (ac) and 2 hrs after meals (pc) (4-6X/day) since last visit 02/03/13 and keeping records of blood sugars for Dr Linna Darner.  Needs new test strip order specifying increased frequency of testing called ASAP to [facility]. Please call Johel after order approved so he can pick up the glucometer test strips ASAP.

## 2013-02-20 ENCOUNTER — Other Ambulatory Visit: Payer: Self-pay | Admitting: Internal Medicine

## 2013-02-22 DIAGNOSIS — M5430 Sciatica, unspecified side: Secondary | ICD-10-CM | POA: Diagnosis not present

## 2013-02-23 ENCOUNTER — Other Ambulatory Visit: Payer: Self-pay | Admitting: *Deleted

## 2013-02-23 MED ORDER — GLIMEPIRIDE 2 MG PO TABS: 2.0000 mg | ORAL_TABLET | Freq: Every day | ORAL | Status: DC

## 2013-02-23 NOTE — Telephone Encounter (Signed)
Rx was refilled for glimepiride 2 mg.  Ag cma

## 2013-03-12 ENCOUNTER — Other Ambulatory Visit: Payer: Self-pay | Admitting: Internal Medicine

## 2013-03-15 NOTE — Telephone Encounter (Signed)
Med filled.  

## 2013-03-16 ENCOUNTER — Ambulatory Visit (INDEPENDENT_AMBULATORY_CARE_PROVIDER_SITE_OTHER): Payer: Medicare Other | Admitting: Internal Medicine

## 2013-03-16 VITALS — BP 118/72 | HR 73 | Temp 98.4°F | Resp 16 | Wt 280.8 lb

## 2013-03-16 DIAGNOSIS — S61209A Unspecified open wound of unspecified finger without damage to nail, initial encounter: Secondary | ICD-10-CM | POA: Diagnosis not present

## 2013-03-16 DIAGNOSIS — Z23 Encounter for immunization: Secondary | ICD-10-CM | POA: Diagnosis not present

## 2013-03-16 DIAGNOSIS — M79609 Pain in unspecified limb: Secondary | ICD-10-CM

## 2013-03-16 DIAGNOSIS — M79644 Pain in right finger(s): Secondary | ICD-10-CM

## 2013-03-16 NOTE — Patient Instructions (Addendum)
WOUND CARE Please return in 7-10 days to have your stitches/staples removed or sooner if you have concerns. . Keep area clean and dry for 24 hours. Do not remove bandage, if applied. . After 24 hours, remove bandage and wash wound gently with mild soap and warm water. Reapply a new bandage after cleaning wound, if directed. . Continue daily cleansing with soap and water until stitches/staples are removed. . Do not apply any ointments or creams to the wound while stitches/staples are in place, as this may cause delayed healing. . Notify the office if you experience any of the following signs of infection: Swelling, redness, pus drainage, streaking, fever >101.0 F . Notify the office if you experience excessive bleeding that does not stop after 15-20 minutes of constant, firm pressure.   

## 2013-03-16 NOTE — Progress Notes (Signed)
Verbal consent obtained from the patient.  Local anesthesia with 4cc Lidocaine 2% without epinephrine.  Wound scrubbed with soap and water and rinsed.  Wound closed with #2 5-0 Prolene (#1simple interrupted, #1 horizontal mattress) sutures.  Wound cleansed and dressed.

## 2013-03-16 NOTE — Progress Notes (Signed)
  Subjective:    Patient ID: Richard [name], male    DOB: 1940/07/12, 72 y.o.   MRN: [id number]  HPI At home and cut distal index finger on a broken light bulb. Needs Td up date   Review of Systems     Objective:   Physical Exam  Vitals reviewed. Constitutional: He appears well-developed and well-nourished. No distress.  HENT:  Head: Normocephalic.  Eyes: EOM are normal. Pupils are equal, round, and reactive to light.  Pulmonary/Chest: Effort normal.  Musculoskeletal: He exhibits tenderness.       Left hand: He exhibits tenderness and laceration. He exhibits normal range of motion, no bony tenderness, normal two-point discrimination, normal capillary refill and no deformity. Normal sensation noted. Normal strength noted.  Skin: Laceration noted.     1 cm distal tip laceration Needs repair    Ms Jacqulynn Cadet to repair      Assessment & Plan:  Wound care  TD update

## 2013-03-18 DIAGNOSIS — M5430 Sciatica, unspecified side: Secondary | ICD-10-CM | POA: Diagnosis not present

## 2013-03-19 DIAGNOSIS — H2510 Age-related nuclear cataract, unspecified eye: Secondary | ICD-10-CM | POA: Diagnosis not present

## 2013-03-24 ENCOUNTER — Other Ambulatory Visit: Payer: Self-pay | Admitting: Orthopedic Surgery

## 2013-03-24 DIAGNOSIS — M4800 Spinal stenosis, site unspecified: Secondary | ICD-10-CM

## 2013-03-24 DIAGNOSIS — M79604 Pain in right leg: Secondary | ICD-10-CM

## 2013-03-25 ENCOUNTER — Encounter: Payer: Self-pay | Admitting: Physician Assistant

## 2013-03-25 ENCOUNTER — Ambulatory Visit (INDEPENDENT_AMBULATORY_CARE_PROVIDER_SITE_OTHER): Payer: Medicare Other | Admitting: Physician Assistant

## 2013-03-25 VITALS — BP 128/82 | HR 85 | Temp 98.4°F | Resp 17 | Ht 73.5 in | Wt 276.0 lb

## 2013-03-25 DIAGNOSIS — S61209D Unspecified open wound of unspecified finger without damage to nail, subsequent encounter: Secondary | ICD-10-CM

## 2013-03-25 DIAGNOSIS — Z5189 Encounter for other specified aftercare: Secondary | ICD-10-CM

## 2013-03-25 NOTE — Progress Notes (Signed)
  Subjective:    Patient ID: Richard [name], male    DOB: [birth year], 72 y.o.   MRN: [id number]  HPI 72 year old male presents for suture removal.  DOI 03/16/13. Wound on left index finger healing well.  Doing well without any issues or complaints. Denies erythema, warmth, or drainage.  No other concerns today.      Review of Systems  Constitutional: Negative for fever and chills.  Musculoskeletal: Negative for arthralgias.  Skin: Positive for wound.       Objective:   Physical Exam  Constitutional: He is oriented to person, place, and time. He appears well-developed and well-nourished.  HENT:  Head: Normocephalic and atraumatic.  Right Ear: External ear normal.  Left Ear: External ear normal.  Eyes: Conjunctivae are normal.  Neck: Normal range of motion.  Neurological: He is alert and oriented to person, place, and time.  Skin:  Left index finger wound well healing without erythema, warmth, or drainage  Psychiatric: He has a normal mood and affect. His behavior is normal. Judgment and thought content normal.          Assessment & Plan:  Wound, open, finger, subsequent encounter  Suture removed. Wound doing well Follow up as needed.

## 2013-04-06 ENCOUNTER — Other Ambulatory Visit: Payer: Self-pay | Admitting: Orthopedic Surgery

## 2013-04-06 ENCOUNTER — Ambulatory Visit
Admission: RE | Admit: 2013-04-06 | Discharge: 2013-04-06 | Disposition: A | Discharge status: Home / Self Care | Payer: Medicare Other | Source: Ambulatory Visit | Attending: Orthopedic Surgery | Admitting: Orthopedic Surgery

## 2013-04-06 ENCOUNTER — Inpatient Hospital Stay
Admission: RE | Admit: 2013-04-06 | Discharge: 2013-04-06 | Disposition: A | Discharge status: Home / Self Care | Payer: Self-pay | Source: Ambulatory Visit | Attending: Orthopedic Surgery | Admitting: Orthopedic Surgery

## 2013-04-06 VITALS — BP 93/35 | HR 41

## 2013-04-06 DIAGNOSIS — M4800 Spinal stenosis, site unspecified: Secondary | ICD-10-CM

## 2013-04-06 DIAGNOSIS — M79604 Pain in right leg: Secondary | ICD-10-CM

## 2013-04-06 DIAGNOSIS — R52 Pain, unspecified: Secondary | ICD-10-CM

## 2013-04-06 DIAGNOSIS — M47817 Spondylosis without myelopathy or radiculopathy, lumbosacral region: Secondary | ICD-10-CM | POA: Diagnosis not present

## 2013-04-06 DIAGNOSIS — M5126 Other intervertebral disc displacement, lumbar region: Secondary | ICD-10-CM | POA: Diagnosis not present

## 2013-04-06 HISTORY — PX: MYELOGRAM: SHX5347

## 2013-04-06 MED ORDER — DIAZEPAM 5 MG PO TABS
5.0000 mg | ORAL_TABLET | Freq: Once | ORAL | Status: AC
  Administered 2013-04-06: 5 mg via ORAL

## 2013-04-06 MED ORDER — ONDANSETRON HCL 4 MG/2ML IJ SOLN
4.0000 mg | Freq: Once | INTRAMUSCULAR | Status: AC
  Administered 2013-04-06: 4 mg via INTRAMUSCULAR

## 2013-04-06 MED ORDER — MEPERIDINE HCL 100 MG/ML IJ SOLN
75.0000 mg | Freq: Once | INTRAMUSCULAR | Status: AC
  Administered 2013-04-06: 75 mg via INTRAMUSCULAR

## 2013-04-06 MED ORDER — IOHEXOL 180 MG/ML  SOLN
15.0000 mL | Freq: Once | INTRAMUSCULAR | Status: AC | PRN
  Administered 2013-04-06: 15 mL via INTRATHECAL

## 2013-04-08 ENCOUNTER — Encounter: Payer: Self-pay | Admitting: Internal Medicine

## 2013-04-08 ENCOUNTER — Ambulatory Visit (INDEPENDENT_AMBULATORY_CARE_PROVIDER_SITE_OTHER): Payer: Medicare Other | Admitting: Internal Medicine

## 2013-04-08 ENCOUNTER — Other Ambulatory Visit: Payer: Medicare Other

## 2013-04-08 VITALS — BP 129/65 | HR 58 | Temp 97.7°F | Resp 14 | Wt 276.4 lb

## 2013-04-08 DIAGNOSIS — M48061 Spinal stenosis, lumbar region without neurogenic claudication: Secondary | ICD-10-CM

## 2013-04-08 DIAGNOSIS — M4800 Spinal stenosis, site unspecified: Secondary | ICD-10-CM | POA: Insufficient documentation

## 2013-04-08 DIAGNOSIS — IMO0001 Reserved for inherently not codable concepts without codable children: Secondary | ICD-10-CM

## 2013-04-08 MED ORDER — METFORMIN HCL 500 MG PO TABS: 500.0000 mg | ORAL_TABLET | Freq: Two times a day (BID) | ORAL | Status: DC

## 2013-04-08 NOTE — Progress Notes (Signed)
Subjective:    Patient ID: Richard [name], male    DOB: [birth year], 72 y.o.   MRN: [id number]  HPI   He stopped Glimiperide 4 weeks ago at my recommendation because of the literature  concerns of possible increased cardiac risk with this medication.  A 3 hr post meal blood sugar was 241 today; FBS had ranged 127-137 with an average of 130.  He has continued a low-fat and low sugar diet. He typically plays golf with increased ambulation at least 3 days a week. He's been unable to do that for the last 3 weeks because of probable radiculopathy for which he is seeing an orthopedist.  Blood pressures average 140/70. He has been compliant with his antihypertensive medicines as well without adverse effect.  He also continues his statin without adverse effect.  His grandmother did have a heart attack in her 54s.  He does have excess urination which he relates to his diuretic. The radiculopathy as manifested as pain in RLE &  tingling in LLE , mainly @ night & not with standing or ambulation. Steroid injection 3 mos ago     Review of Systems  He denies chest pain, palpitations, claudication, or paroxysmal nocturnal dyspnea. He has no exertional dyspnea with exercise. He has had no lightheadedness or presyncope. He denies any edema.  There is no polydipsia or polyphasia.  He has no new skin lesions.  He also has no lower vision, double vision, or loss of vision.  He does have some constipation but no other significant GI symptoms  He denies myalgias.  No incontinence     Objective:   Physical Exam  Gen.: Adequately nourished in appearance but central weight excess. Alert, appropriate and cooperative throughout exam.  Head: Normocephalic without obvious abnormalities Eyes: No corneal or conjunctival inflammation noted.  Nose: External nasal exam reveals no deformity or inflammation. Nasal mucosa are pink and moist. No lesions or exudates noted.  Mouth: Oral mucosa and oropharynx  reveal no lesions or exudates. Teeth in good repair. Upper partial Neck: No deformities, masses, or tenderness noted. Thyroid normal. Lungs: Normal respiratory effort; chest expands symmetrically. Lungs are clear to auscultation without rales, wheezes, or increased work of breathing. Heart: Normal rate and rhythm. Normal S1 and S2. No gallop, click, or rub. Grade 1/2 over 6 systolic murmur. Abdomen: Bowel sounds normal; abdomen soft and nontender. No masses, organomegaly or hernias noted. Protuberant                      Musculoskeletal/extremities:  No clubbing, cyanosis,  or significant extremity  deformity noted. Range of motion normal .Tone & strength  Normal. Trace sockline edema Joints  reveal mild PIP changes. Nail health good. Able to lie down & sit up w/o help. Negative SLR bilaterally Vascular: Carotid, radial artery, dorsalis pedis and  posterior tibial pulses are full and equal. No bruits present. Neurologic: Alert and oriented x3. Deep tendon reflexes symmetrical and normal.  Gait  With limp on R; normal heel & toe walking .        Skin: Intact without suspicious lesions or rashes. Lymph: No cervical, axillary lymphadenopathy present. Psych: Mood and affect are normal. Normally interactive  Assessment & Plan:  #1 DM , ? Control #2 spinal stenosis & synovial cyst See Orders

## 2013-04-08 NOTE — Patient Instructions (Signed)
The most common cause of elevated triglycerides (TG) is the ingestion of sugar from high fructose corn syrup sources added to processed foods & drinks.  Eat a low-fat diet with lots of fruits and vegetables, up to 7-9 servings per day. Consume less than 40  Grams (preferably ZERO) of sugar per day from foods & drinks with High Fructose Corn Syrup (HFCS) sugar as #1,2,3 or # 4 on label.[employer], [employer] do not carry products with HFCS. Follow a  low carb nutrition program such as Entiat or The New Sugar Busters  to prevent Diabetes progression . White carbohydrates (potatoes, rice, bread, and pasta) have a high spike of sugar and a high load of sugar. For example a  baked potato has a cup of sugar and a  french fry  2 teaspoons of sugar. Yams, wild  rice, whole grained bread &  wheat pasta have been much lower spike and load of  sugar. Portions should be the size of a deck of cards or your palm.  Take metformin 500 mg twice a day with the 2 largest meals. Fasting labs 05/18/13

## 2013-04-13 DIAGNOSIS — M5430 Sciatica, unspecified side: Secondary | ICD-10-CM | POA: Diagnosis not present

## 2013-04-27 DIAGNOSIS — M47817 Spondylosis without myelopathy or radiculopathy, lumbosacral region: Secondary | ICD-10-CM | POA: Diagnosis not present

## 2013-04-30 ENCOUNTER — Encounter: Payer: Self-pay | Admitting: Internal Medicine

## 2013-05-04 ENCOUNTER — Telehealth: Payer: Self-pay | Admitting: *Deleted

## 2013-05-04 NOTE — Telephone Encounter (Signed)
Prior authorization was approved for Nexium. Effective date 07/01/2013-06/30/2014. Reference # [id number]. SW, CMA

## 2013-05-06 ENCOUNTER — Other Ambulatory Visit: Payer: Self-pay

## 2013-05-14 DIAGNOSIS — Z23 Encounter for immunization: Secondary | ICD-10-CM | POA: Diagnosis not present

## 2013-05-17 DIAGNOSIS — M47817 Spondylosis without myelopathy or radiculopathy, lumbosacral region: Secondary | ICD-10-CM | POA: Diagnosis not present

## 2013-05-18 ENCOUNTER — Other Ambulatory Visit: Payer: Medicare Other

## 2013-05-18 DIAGNOSIS — IMO0001 Reserved for inherently not codable concepts without codable children: Secondary | ICD-10-CM

## 2013-05-18 LAB — MICROALBUMIN / CREATININE URINE RATIO: Microalb Creat Ratio: 0.2 mg/g (ref 0.0–30.0)

## 2013-05-18 LAB — BASIC METABOLIC PANEL
Chloride: 103 mEq/L (ref 96–112)
Creatinine, Ser: 1 mg/dL (ref 0.4–1.5)
Potassium: 4.3 mEq/L (ref 3.5–5.1)

## 2013-05-18 LAB — HEMOGLOBIN A1C: Hgb A1c MFr Bld: 7.7 % — ABNORMAL HIGH (ref 4.6–6.5)

## 2013-05-21 ENCOUNTER — Other Ambulatory Visit: Payer: Self-pay | Admitting: Internal Medicine

## 2013-05-21 DIAGNOSIS — E118 Type 2 diabetes mellitus with unspecified complications: Secondary | ICD-10-CM | POA: Insufficient documentation

## 2013-05-21 DIAGNOSIS — E1149 Type 2 diabetes mellitus with other diabetic neurological complication: Secondary | ICD-10-CM

## 2013-05-21 DIAGNOSIS — E1151 Type 2 diabetes mellitus with diabetic peripheral angiopathy without gangrene: Secondary | ICD-10-CM | POA: Insufficient documentation

## 2013-05-25 ENCOUNTER — Ambulatory Visit (INDEPENDENT_AMBULATORY_CARE_PROVIDER_SITE_OTHER): Payer: Medicare Other | Admitting: Cardiology

## 2013-05-25 ENCOUNTER — Encounter: Payer: Self-pay | Admitting: Cardiology

## 2013-05-25 VITALS — BP 140/84 | HR 60 | Ht 73.5 in | Wt 275.8 lb

## 2013-05-25 DIAGNOSIS — I25810 Atherosclerosis of coronary artery bypass graft(s) without angina pectoris: Secondary | ICD-10-CM | POA: Diagnosis not present

## 2013-05-25 NOTE — Patient Instructions (Signed)
The current medical regimen is effective;  continue present plan and medications.  Follow up in 1 year with Dr Hochrein.  You will receive a letter in the mail 2 months before you are due.  Please call us when you receive this letter to schedule your follow up appointment.  

## 2013-05-25 NOTE — Progress Notes (Signed)
HPI The patient presents for preoperative evaluation. He has a history of artery disease. He has had bypass several years ago. Since that time he's had no recurrent symptoms and has remained active. He's been somewhat limited because he has back pain and is going to have apparent spine cyst surgery. However, he still able to golf and do this up until a week ago. With all of his activities he gets none of the back pain that was his previous angina. He denies any chest pressure, neck or arm discomfort. He does not have palpitations, presyncope or syncope. He does not have shortness of breath, PND or orthopnea. He has had no weight gain or edema.  Allergies  Allergen Reactions  . Benazepril     angioedema; he is not a candidate for any angiotensin receptor blockers because of this significant allergic reaction. Because of a history of documented adverse serious drug reaction;Medi Alert bracelet  is recommended  . Hctz [Hydrochlorothiazide] Swelling    Tongue and lip swelling   . Aspirin Other (See Comments)    gastritis    Current Outpatient Prescriptions  Medication Sig Dispense Refill  . allopurinol (ZYLOPRIM) 300 MG tablet Take 300 mg by mouth.       [name] amLODipine (NORVASC) 5 MG tablet Take one tablet by mouth one time daily  90 tablet  2  . Ascorbic Acid (VITAMIN C) 500 MG tablet Take 500 mg by mouth every morning.       [name] aspirin 81 MG tablet Take 81 mg by mouth every morning.       [name] atorvastatin (LIPITOR) 80 MG tablet Take one tablet by mouth at bedtime  90 tablet  2  . cyanocobalamin ([employer] VITAMIN B12) 1000 MCG tablet Take 1 tablet (1,000 mcg total) by mouth daily.      [name] esomeprazole (NEXIUM) 40 MG capsule Take 1 capsule (40 mg total) by mouth daily.  30 capsule  5  . ezetimibe (ZETIA) 10 MG tablet Take 10 mg by mouth every morning.      . fexofenadine (ALLEGRA) 180 MG tablet Take 90 mg by mouth 2 (two) times daily.       . fluticasone (FLONASE) 50 MCG/ACT nasal spray Use one  spray in each nostril twice daily  as needed  16 g  4  . furosemide (LASIX) 40 MG tablet Take 1 tablet (40 mg total) by mouth 2 (two) times daily.  180 tablet  1  . metFORMIN (GLUCOPHAGE) 500 MG tablet Take 1 tablet (500 mg total) by mouth 2 (two) times daily with a meal.  180 tablet  3  . metoprolol succinate (TOPROL-XL) 100 MG 24 hr tablet Take one and one-half tablet by mouth daily  135 tablet  2  . nitroGLYCERIN (NITROSTAT) 0.4 MG SL tablet Place 0.4 mg under the tongue every 5 (five) minutes as needed for chest pain.      . potassium chloride SA (K-DUR,KLOR-CON) 20 MEQ tablet Take 20 mEq by mouth 2 (two) times daily.       . psyllium (HYDROCIL/METAMUCIL) 95 % PACK Take 1 packet by mouth 2 (two) times daily.       No current facility-administered medications for this visit.    Past Medical History  Diagnosis Date  . Gout   . Esophageal stricture     X 4  . CAD (coronary artery disease)   . Mixed hyperlipidemia   . Peptic stricture of esophagus   . Antritis (stomach)  mild  . Duodenitis     chronic  . Diverticulosis     mild, left colon  . Anemia   . DM (diabetes mellitus)   . HTN (hypertension)   . Sleep apnea   . Sliding hiatal hernia   . Allergy   . Arthritis     bil knees  . GERD (gastroesophageal reflux disease)     Past Surgical History  Procedure Laterality Date  . Angioplasty  1993  . Coronary angioplasty with stent placement  05/1997  . Vasectomy    . Coronary artery bypass graft  03/2000    5 vessel  . Esophageal dilation      Dr. Lyla Son  . Tonsillectomy    . Appendectomy    . Esophagogastroduodenoscopy      multiple  . Flexible sigmoidoscopy      multiple  . Panendoscopy    . Meniscus repair  2011    left knee  . Shoulder surgery  08/2010    right, screws placed  . Colonoscopy with polypectomy  2013  . Upper gi endoscopy  2013    Gastritis; Dr Carlean Purl  . Lumbar myelogram  04/06/13    Dr Gladstone Lighter    ROS:  As stated in the HPI and  negative for all other systems.  PHYSICAL EXAM BP 140/84  Pulse 60  Ht 6' 1.5" (1.867 m)  Wt 275 lb 12.8 oz (125.102 kg)  BMI 35.89 kg/m2 GENERAL:  Well appearing HEENT:  Pupils equal round and reactive, fundi not visualized, oral mucosa unremarkable NECK:  No jugular venous distention, waveform within normal limits, carotid upstroke brisk and symmetric, no bruits, no thyromegaly LYMPHATICS:  No cervical, inguinal adenopathy LUNGS:  Clear to auscultation bilaterally BACK:  No CVA tenderness CHEST:  Well healed sternotomy scar. HEART:  PMI not displaced or sustained,S1 and S2 within normal limits, no S3, no S4, no clicks, no rubs, apical systolic brief murmur, no diastolic murmurs ABD:  Flat, positive bowel sounds normal in frequency in pitch, no bruits, no rebound, no guarding, no midline pulsatile mass, no hepatomegaly, no splenomegaly EXT:  2 plus pulses throughout, no edema, no cyanosis no clubbing SKIN:  No rashes no nodules NEURO:  Cranial nerves II through XII grossly intact, motor grossly intact throughout PSYCH:  Cognitively intact, oriented to person place and time   EKG:  Sinus rhythm with Mobitz type I heart block, rate 58, axis within normal limits, no acute ST-T wave changes. 05/25/2013   ASSESSMENT AND PLAN  CAD:  The patient has no new sypmtoms.  No further cardiovascular testing is indicated.  We will continue with aggressive risk reduction and meds as listed.  PREOP:  The patient has a high functional level. He has no symptoms since his last stress test. He is going for a procedure that is not felt to be high risk. Therefore, based on ACC/AHA guidelines, the patient would be at acceptable risk for the planned procedure without further cardiovascular testing.  ARRHYTHMIA:  The patient has an asymptomatic Mobitz type I. This should not be a contraindication to planned procedure. He is having no symptoms.

## 2013-05-26 ENCOUNTER — Encounter: Payer: Self-pay | Admitting: Cardiology

## 2013-05-31 ENCOUNTER — Other Ambulatory Visit: Payer: Self-pay | Admitting: *Deleted

## 2013-05-31 MED ORDER — FREESTYLE LANCETS MISC: Status: DC

## 2013-06-02 ENCOUNTER — Telehealth: Payer: Self-pay | Admitting: Cardiology

## 2013-06-02 NOTE — Telephone Encounter (Signed)
Clearance faxed to [facility]big[facility] as requested  Pt is aware.

## 2013-06-02 NOTE — Telephone Encounter (Signed)
New problem    Office need cardiac clearance in writing for pt to have back procedure. Please fax to [place].

## 2013-06-02 NOTE — Telephone Encounter (Signed)
New message    Need clearance for surgery report sent to Dr Charlestine Night office so pt can have back surgery.

## 2013-06-02 NOTE — Telephone Encounter (Signed)
Faxed clearance to number left and received confirmation.  Left message for Richard [name] that clearance was faxed and to call back if she didn't receive it.

## 2013-06-04 NOTE — Progress Notes (Signed)
Surgery scheduled for 06/17/13.  preop on 06/10/13 at 1100am.  Need orders in EPIC.  Thank You

## 2013-06-09 ENCOUNTER — Other Ambulatory Visit: Payer: Self-pay | Admitting: Cardiology

## 2013-06-10 ENCOUNTER — Encounter (HOSPITAL_COMMUNITY)
Admission: RE | Admit: 2013-06-10 | Discharge: 2013-06-10 | Disposition: A | Discharge status: Home / Self Care | Payer: Medicare Other | Source: Ambulatory Visit | Attending: Orthopedic Surgery | Admitting: Orthopedic Surgery

## 2013-06-10 ENCOUNTER — Ambulatory Visit (HOSPITAL_COMMUNITY)
Admission: RE | Admit: 2013-06-10 | Discharge: 2013-06-10 | Disposition: A | Discharge status: Home / Self Care | Payer: Medicare Other | Source: Ambulatory Visit | Attending: Surgical | Admitting: Surgical

## 2013-06-10 ENCOUNTER — Encounter (HOSPITAL_COMMUNITY): Payer: Self-pay | Admitting: Pharmacy Technician

## 2013-06-10 ENCOUNTER — Encounter (HOSPITAL_COMMUNITY): Payer: Self-pay

## 2013-06-10 DIAGNOSIS — Z01812 Encounter for preprocedural laboratory examination: Secondary | ICD-10-CM | POA: Diagnosis not present

## 2013-06-10 DIAGNOSIS — Z01818 Encounter for other preprocedural examination: Secondary | ICD-10-CM | POA: Diagnosis not present

## 2013-06-10 DIAGNOSIS — I7090 Unspecified atherosclerosis: Secondary | ICD-10-CM | POA: Insufficient documentation

## 2013-06-10 DIAGNOSIS — Z951 Presence of aortocoronary bypass graft: Secondary | ICD-10-CM | POA: Diagnosis not present

## 2013-06-10 DIAGNOSIS — M5380 Other specified dorsopathies, site unspecified: Secondary | ICD-10-CM | POA: Diagnosis not present

## 2013-06-10 LAB — URINALYSIS, ROUTINE W REFLEX MICROSCOPIC
Bilirubin Urine: NEGATIVE
Glucose, UA: NEGATIVE mg/dL
Hgb urine dipstick: NEGATIVE
Ketones, ur: NEGATIVE mg/dL
Leukocytes, UA: NEGATIVE
Nitrite: NEGATIVE
Protein, ur: NEGATIVE mg/dL
Specific Gravity, Urine: 1.01 (ref 1.005–1.030)
Urobilinogen, UA: 0.2 mg/dL (ref 0.0–1.0)
pH: 6 (ref 5.0–8.0)

## 2013-06-10 LAB — COMPREHENSIVE METABOLIC PANEL
ALT: 16 U/L (ref 0–53)
AST: 17 U/L (ref 0–37)
Albumin: 3.7 g/dL (ref 3.5–5.2)
Alkaline Phosphatase: 125 U/L — ABNORMAL HIGH (ref 39–117)
BUN: 16 mg/dL (ref 6–23)
CO2: 26 mEq/L (ref 19–32)
Calcium: 9.3 mg/dL (ref 8.4–10.5)
Chloride: 102 mEq/L (ref 96–112)
Creatinine, Ser: 1.04 mg/dL (ref 0.50–1.35)
GFR calc Af Amer: 80 mL/min — ABNORMAL LOW (ref >90)
GFR calc non Af Amer: 69 mL/min — ABNORMAL LOW (ref >90)
Glucose, Bld: 143 mg/dL — ABNORMAL HIGH (ref 70–99)
Potassium: 4.3 mEq/L (ref 3.5–5.1)
Sodium: 138 mEq/L (ref 135–145)
Total Bilirubin: 0.4 mg/dL (ref 0.3–1.2)
Total Protein: 7 g/dL (ref 6.0–8.3)

## 2013-06-10 LAB — SURGICAL PCR SCREEN
MRSA, PCR: NEGATIVE
Staphylococcus aureus: NEGATIVE

## 2013-06-10 LAB — CBC
HCT: 39.8 % (ref 39.0–52.0)
Hemoglobin: 12.8 g/dL — ABNORMAL LOW (ref 13.0–17.0)
MCH: 26.4 pg (ref 26.0–34.0)
MCHC: 32.2 g/dL (ref 30.0–36.0)
MCV: 82.2 fL (ref 78.0–100.0)
RBC: 4.84 MIL/uL (ref 4.22–5.81)

## 2013-06-10 LAB — APTT: aPTT: 39 seconds — ABNORMAL HIGH (ref 24–37)

## 2013-06-10 LAB — PROTIME-INR
INR: 1.12 (ref 0.00–1.49)
Prothrombin Time: 14.2 seconds (ref 11.6–15.2)

## 2013-06-10 NOTE — Patient Instructions (Addendum)
Christapher Welser [place]  06/10/2013                           YOUR PROCEDURE IS SCHEDULED ON: 06/17/13               PLEASE REPORT TO SHORT STAY CENTER AT : 10:00 AM               CALL THIS NUMBER IF ANY PROBLEMS THE DAY OF SURGERY :               [phone]                      REMEMBER:   Do not eat food or drink liquids AFTER MIDNIGHT  May have clear liquids UNTIL 6 HOURS BEFORE SURGERY (6:30 AM)  Clear liquids include soda, tea, black coffee, apple or grape juice, broth.  Take these medicines the morning of surgery with A SIP OF WATER: ALLOPURINOL / AMLODIPINE / LIPITOR / NEXIUM / ZETIA / ALLEGRA / METOPROLOL / OXYCODONE IF NEEDED   Do not wear jewelry, make-up   Do not wear lotions, powders, or perfumes.   Do not shave legs or underarms 12 hrs. before surgery (men may shave face)  Do not bring valuables to the hospital.  Contacts, dentures or bridgework may not be worn into surgery.  Leave suitcase in the car. After surgery it may be brought to your room.  For patients admitted to the hospital more than one night, checkout time is 11:00                          The day of discharge.   Patients discharged the day of surgery will not be allowed to drive home                             If going home same day of surgery, must have someone stay with you first                           24 hrs at home and arrange for some one to drive you home from hospital.    Special Instructions:   Please read over the following fact sheets that you were given:               1. MRSA  INFORMATION                      2. [place]               3. INCENTIVE SPIROMETER               4. BRING C PAP [place]                                                X_____________________________________________________________________        Failure to follow these instructions may result in cancellation of your surgery

## 2013-06-15 NOTE — H&P (Signed)
Richard [name] is an 72 y.o. male.   Chief Complaint: back pain HPI: Richard [name] presented to Dr. Charlestine Night office with the chief complaint of back pain. He has had this discomfort for about 8 months. He did not have a specific injury. He has been having pain in the low back that radiates into the right lower extremity. He denies numbness and tingling. He has not had any change in bladder and bowel function. He has had conservative treatment including Prednisone and epidural steroid injection with no relief. CT shows a large synovial cyst at L4-L5 with spinal stenosis and disc herniation.  Past Medical History  Diagnosis Date  . Gout   . Esophageal stricture     X 4  . Mixed hyperlipidemia   . Peptic stricture of esophagus   . Antritis (stomach)     mild  . Duodenitis     chronic  . Diverticulosis     mild, left colon  . Anemia   . DM (diabetes mellitus)   . HTN (hypertension)   . Sliding hiatal hernia   . Allergy   . Arthritis     bil knees  . GERD (gastroesophageal reflux disease)   . S/P CABG x 5 2001  . CAD (coronary artery disease)     followed by Dr. Verl Blalock  . HNP (herniated nucleus pulposus), lumbar   . Nocturia   . Sleep apnea     uses c pap with 2L O2 at night    Past Surgical History  Procedure Laterality Date  . Angioplasty  1993  . Coronary angioplasty with stent placement  05/1997  . Vasectomy    . Coronary artery bypass graft  03/2000    5 vessel  . Esophageal dilation      Dr. Lyla Son  . Tonsillectomy    . Appendectomy    . Esophagogastroduodenoscopy      multiple  . Flexible sigmoidoscopy      multiple  . Panendoscopy    . Meniscus repair  2011    left knee  . Shoulder surgery  08/2010    right, screws placed  . Colonoscopy with polypectomy  2013  . Upper gi endoscopy  2013    Gastritis; Dr Carlean Purl  . Lumbar myelogram  04/06/13    Dr Gladstone Lighter    Family History  Problem Relation Age of Onset  . Stroke Father   . Hypertension Father   .  Pancreatic cancer Mother   . Diabetes Maternal Grandmother   . Stroke Maternal Grandmother   . Heart attack Paternal Grandmother   . Colon cancer Neg Hx   . Esophageal cancer Neg Hx   . Rectal cancer Neg Hx   . Stomach cancer Neg Hx   . Ulcers Neg Hx    Social History:  reports that he has never smoked. He has never used smokeless tobacco. He reports that he drinks alcohol. He reports that he does not use illicit drugs.  Allergies:  Allergies  Allergen Reactions  . Benazepril     angioedema; he is not a candidate for any angiotensin receptor blockers because of this significant allergic reaction. Because of a history of documented adverse serious drug reaction;Medi Alert bracelet  is recommended  . Hctz [Hydrochlorothiazide] Swelling    Tongue and lip swelling   . Aspirin Other (See Comments)    Gastritis, cant take 325 Mg aspirin      Current outpatient prescriptions: allopurinol (ZYLOPRIM) 300 MG tablet, Take 300 mg by  mouth 1 day or 1 dose. , Disp: , Rfl: ;   amLODipine (NORVASC) 5 MG tablet, Take 5 mg by mouth daily with breakfast., Disp: , Rfl: ;   Ascorbic Acid (VITAMIN C) 500 MG tablet, Take 500 mg by mouth every morning. , Disp: , Rfl: ;   atorvastatin (LIPITOR) 80 MG tablet, Take 80 mg by mouth daily with breakfast., Disp: , Rfl:  CINNAMON PO, Take 2 tablets by mouth daily., Disp: , Rfl: ;  Coenzyme Q10 (CO Q-10 PO), Take 1 tablet by mouth daily., Disp: , Rfl: ;   cyanocobalamin ([employer] VITAMIN B12) 1000 MCG tablet, Take 1 tablet (1,000 mcg total) by mouth daily., Disp: , Rfl: ;   esomeprazole (NEXIUM) 40 MG capsule, Take 1 capsule (40 mg total) by mouth daily., Disp: 30 capsule, Rfl: 5;   ezetimibe (ZETIA) 10 MG tablet, Take 10 mg by mouth every morning., Disp: , Rfl:  fexofenadine (ALLEGRA) 180 MG tablet, Take 90 mg by mouth 2 (two) times daily. , Disp: , Rfl: ;   Flaxseed, Linseed, (FLAX SEED OIL) 1300 MG CAPS, Take 1 tablet by mouth daily., Disp: , Rfl: ;   fluticasone  (FLONASE) 50 MCG/ACT nasal spray, Place 1 spray into both nostrils daily as needed for allergies or rhinitis., Disp: , Rfl: ;   furosemide (LASIX) 40 MG tablet, Take 40 mg by mouth 2 (two) times daily., Disp: , Rfl:  Lancets (FREESTYLE) lancets, Test blood sugar daily as instructed. Dx: 250.00, Disp: 100 each, Rfl: 12;   metFORMIN (GLUCOPHAGE) 500 MG tablet, Take 500 mg by mouth 2 (two) times daily with a meal., Disp: , Rfl: ;   metoprolol succinate (TOPROL-XL) 100 MG 24 hr tablet, Take 150 mg by mouth daily with breakfast. Take with or immediately following a meal., Disp: , Rfl:  nitroGLYCERIN (NITROSTAT) 0.4 MG SL tablet, Place 0.4 mg under the tongue every 5 (five) minutes as needed for chest pain., Disp: , Rfl: ;   Omega-3 Fatty Acids (OMEGA 3 PO), Take 1 tablet by mouth daily., Disp: , Rfl: ;   potassium chloride SA (K-DUR,KLOR-CON) 20 MEQ tablet, Take 20 mEq by mouth 2 (two) times daily., Disp: , Rfl: ;   psyllium (HYDROCIL/METAMUCIL) 95 % PACK, Take 1 packet by mouth 2 (two) times daily., Disp: , Rfl:  aspirin 81 MG tablet, Take 81 mg by mouth daily., Disp: , Rfl:    Review of Systems  Constitutional: Negative.   HENT: Negative.   Eyes: Negative.   Respiratory: Negative.   Cardiovascular: Negative.   Gastrointestinal: Negative.   Genitourinary: Positive for frequency. Negative for dysuria, urgency, hematuria and flank pain.  Musculoskeletal: Positive for back pain. Negative for falls, joint pain, myalgias and neck pain.  Skin: Negative.   Neurological: Negative.   Endo/Heme/Allergies: Negative.   Psychiatric/Behavioral: Negative.     Physical Exam  Constitutional: He is oriented to person, place, and time. He appears well-developed and well-nourished. No distress.  HENT:  Head: Normocephalic and atraumatic.  Right Ear: External ear normal.  Left Ear: External ear normal.  Nose: Nose normal.  Mouth/Throat: Oropharynx is clear and moist.  Eyes: Conjunctivae and EOM are normal.   Neck: Normal range of motion. Neck supple.  Cardiovascular: Intact distal pulses.   Murmur heard. Respiratory: Effort normal and breath sounds normal. No respiratory distress. He has no wheezes.  GI: Soft. Bowel sounds are normal. He exhibits no distension. There is no tenderness.  Musculoskeletal:       Right hip:  Normal.       Left hip: Normal.       Right knee: Normal.       Left knee: Normal.       Lumbar back: He exhibits decreased range of motion, tenderness, pain and spasm.       Right lower leg: He exhibits no tenderness and no swelling.       Left lower leg: He exhibits no tenderness and no swelling.  Neurological: He is alert and oriented to person, place, and time. He has normal strength and normal reflexes. No sensory deficit.  Skin: No rash noted. He is not diaphoretic. No erythema.  Psychiatric: He has a normal mood and affect. His behavior is normal.     Assessment/Plan Lumbar disc herniation L4-L5 right Lumbar spinal stenosis Synovial cyst L4-L5 He needs a lumbar microdiscectomy and lumbar hemilaminectomy with excision of synovial cyst at L4-L5 on the right. The possible complications of spinal surgery number one could be infection, which is extremely rare. We do use antibiotics prior to the surgery and during surgery and after surgery. Number two is always a slight degree of probability that you could develop a blood clot in your leg after any type of surgery and we try our best to prevent that with aspirin post op when it is safe to begin. The third is a dural leak. That is the spinal fluid leak that could occur. At certain rare times the bone or the disc could literally stick to the dura which is the lining which contains the spinal fluid and we could develop a small tear in that lining which we then patch up. That is an extremely rare complication. The last and final complication is a recurrent disc rupture. That means that you could rupture another small piece of disc  later on down the road and there is about a 2% chance of that.  Bexley Mclester, [place] 06/15/2013, 3:02 PM

## 2013-06-17 ENCOUNTER — Ambulatory Visit (HOSPITAL_COMMUNITY): Payer: Medicare Other

## 2013-06-17 ENCOUNTER — Inpatient Hospital Stay (HOSPITAL_COMMUNITY)
Admission: RE | Admit: 2013-06-17 | Discharge: 2013-06-21 | DRG: 520 | Disposition: A | Discharge status: Home / Self Care | Payer: Medicare Other | Source: Ambulatory Visit | Attending: Orthopedic Surgery | Admitting: Orthopedic Surgery

## 2013-06-17 ENCOUNTER — Encounter (HOSPITAL_COMMUNITY): Payer: Medicare Other | Admitting: Anesthesiology

## 2013-06-17 ENCOUNTER — Encounter (HOSPITAL_COMMUNITY)
Admission: RE | Disposition: A | Discharge status: Home / Self Care | Payer: Self-pay | Source: Ambulatory Visit | Attending: Orthopedic Surgery

## 2013-06-17 ENCOUNTER — Encounter (HOSPITAL_COMMUNITY): Payer: Self-pay | Admitting: *Deleted

## 2013-06-17 ENCOUNTER — Ambulatory Visit (HOSPITAL_COMMUNITY): Payer: Medicare Other | Admitting: Anesthesiology

## 2013-06-17 DIAGNOSIS — M5126 Other intervertebral disc displacement, lumbar region: Principal | ICD-10-CM | POA: Diagnosis present

## 2013-06-17 DIAGNOSIS — Z951 Presence of aortocoronary bypass graft: Secondary | ICD-10-CM

## 2013-06-17 DIAGNOSIS — M519 Unspecified thoracic, thoracolumbar and lumbosacral intervertebral disc disorder: Secondary | ICD-10-CM | POA: Diagnosis not present

## 2013-06-17 DIAGNOSIS — Z8249 Family history of ischemic heart disease and other diseases of the circulatory system: Secondary | ICD-10-CM | POA: Diagnosis not present

## 2013-06-17 DIAGNOSIS — E119 Type 2 diabetes mellitus without complications: Secondary | ICD-10-CM | POA: Diagnosis not present

## 2013-06-17 DIAGNOSIS — I10 Essential (primary) hypertension: Secondary | ICD-10-CM | POA: Diagnosis present

## 2013-06-17 DIAGNOSIS — M47817 Spondylosis without myelopathy or radiculopathy, lumbosacral region: Secondary | ICD-10-CM | POA: Diagnosis not present

## 2013-06-17 DIAGNOSIS — Z886 Allergy status to analgesic agent status: Secondary | ICD-10-CM | POA: Diagnosis not present

## 2013-06-17 DIAGNOSIS — M7130 Other bursal cyst, unspecified site: Secondary | ICD-10-CM | POA: Diagnosis present

## 2013-06-17 DIAGNOSIS — J4489 Other specified chronic obstructive pulmonary disease: Secondary | ICD-10-CM | POA: Diagnosis present

## 2013-06-17 DIAGNOSIS — M62838 Other muscle spasm: Secondary | ICD-10-CM | POA: Diagnosis not present

## 2013-06-17 DIAGNOSIS — Z800 Family history of malignant neoplasm of digestive organs: Secondary | ICD-10-CM

## 2013-06-17 DIAGNOSIS — I2510 Atherosclerotic heart disease of native coronary artery without angina pectoris: Secondary | ICD-10-CM | POA: Diagnosis not present

## 2013-06-17 DIAGNOSIS — R339 Retention of urine, unspecified: Secondary | ICD-10-CM | POA: Diagnosis not present

## 2013-06-17 DIAGNOSIS — K219 Gastro-esophageal reflux disease without esophagitis: Secondary | ICD-10-CM | POA: Diagnosis not present

## 2013-06-17 DIAGNOSIS — M79609 Pain in unspecified limb: Secondary | ICD-10-CM | POA: Diagnosis not present

## 2013-06-17 DIAGNOSIS — Z833 Family history of diabetes mellitus: Secondary | ICD-10-CM | POA: Diagnosis not present

## 2013-06-17 DIAGNOSIS — Z823 Family history of stroke: Secondary | ICD-10-CM | POA: Diagnosis not present

## 2013-06-17 DIAGNOSIS — J449 Chronic obstructive pulmonary disease, unspecified: Secondary | ICD-10-CM | POA: Diagnosis present

## 2013-06-17 DIAGNOSIS — M109 Gout, unspecified: Secondary | ICD-10-CM | POA: Diagnosis present

## 2013-06-17 DIAGNOSIS — E782 Mixed hyperlipidemia: Secondary | ICD-10-CM | POA: Diagnosis present

## 2013-06-17 DIAGNOSIS — Z79899 Other long term (current) drug therapy: Secondary | ICD-10-CM | POA: Diagnosis not present

## 2013-06-17 DIAGNOSIS — Z23 Encounter for immunization: Secondary | ICD-10-CM

## 2013-06-17 DIAGNOSIS — Z8719 Personal history of other diseases of the digestive system: Secondary | ICD-10-CM | POA: Diagnosis not present

## 2013-06-17 DIAGNOSIS — M7138 Other bursal cyst, other site: Secondary | ICD-10-CM

## 2013-06-17 DIAGNOSIS — M48062 Spinal stenosis, lumbar region with neurogenic claudication: Secondary | ICD-10-CM

## 2013-06-17 DIAGNOSIS — G4730 Sleep apnea, unspecified: Secondary | ICD-10-CM | POA: Diagnosis present

## 2013-06-17 HISTORY — PX: LUMBAR LAMINECTOMY/DECOMPRESSION MICRODISCECTOMY: SHX5026

## 2013-06-17 LAB — GLUCOSE, CAPILLARY
Glucose-Capillary: 106 mg/dL — ABNORMAL HIGH (ref 70–99)
Glucose-Capillary: 113 mg/dL — ABNORMAL HIGH (ref 70–99)
Glucose-Capillary: 108 mg/dL — ABNORMAL HIGH (ref 70–99)

## 2013-06-17 SURGERY — LUMBAR LAMINECTOMY/DECOMPRESSION MICRODISCECTOMY 1 LEVEL
Anesthesia: General | Site: Back | Laterality: Right

## 2013-06-17 MED ORDER — ALLOPURINOL 300 MG PO TABS
300.0000 mg | ORAL_TABLET | Freq: Every day | ORAL | Status: DC
  Administered 2013-06-18 – 2013-06-21 (×4): 300 mg via ORAL
  Filled 2013-06-17 (×4): qty 1

## 2013-06-17 MED ORDER — THROMBIN 5000 UNITS EX SOLR
OROMUCOSAL | Status: DC | PRN
  Administered 2013-06-17 (×2): via TOPICAL

## 2013-06-17 MED ORDER — NEOSTIGMINE METHYLSULFATE 1 MG/ML IJ SOLN
INTRAMUSCULAR | Status: AC
  Filled 2013-06-17: qty 10

## 2013-06-17 MED ORDER — BUPIVACAINE-EPINEPHRINE PF 0.5-1:200000 % IJ SOLN
INTRAMUSCULAR | Status: AC
  Filled 2013-06-17: qty 30

## 2013-06-17 MED ORDER — EZETIMIBE 10 MG PO TABS
10.0000 mg | ORAL_TABLET | Freq: Every morning | ORAL | Status: DC
  Administered 2013-06-18 – 2013-06-21 (×4): 10 mg via ORAL
  Filled 2013-06-17 (×4): qty 1

## 2013-06-17 MED ORDER — GLYCOPYRROLATE 0.2 MG/ML IJ SOLN
INTRAMUSCULAR | Status: AC
  Filled 2013-06-17: qty 3

## 2013-06-17 MED ORDER — NEOSTIGMINE METHYLSULFATE 1 MG/ML IJ SOLN
INTRAMUSCULAR | Status: DC | PRN
  Administered 2013-06-17: 5 mg via INTRAVENOUS

## 2013-06-17 MED ORDER — PANTOPRAZOLE SODIUM 40 MG PO TBEC
40.0000 mg | DELAYED_RELEASE_TABLET | Freq: Every day | ORAL | Status: DC
  Administered 2013-06-18 – 2013-06-21 (×4): 40 mg via ORAL
  Filled 2013-06-17 (×4): qty 1

## 2013-06-17 MED ORDER — ONDANSETRON HCL 4 MG/2ML IJ SOLN
4.0000 mg | INTRAMUSCULAR | Status: DC | PRN
  Administered 2013-06-18: 4 mg via INTRAVENOUS
  Filled 2013-06-17: qty 2

## 2013-06-17 MED ORDER — METHOCARBAMOL 500 MG PO TABS
500.0000 mg | ORAL_TABLET | Freq: Four times a day (QID) | ORAL | Status: DC | PRN
  Administered 2013-06-17 – 2013-06-21 (×9): 500 mg via ORAL
  Filled 2013-06-17 (×9): qty 1

## 2013-06-17 MED ORDER — THROMBIN 5000 UNITS EX SOLR: CUTANEOUS | Status: DC | PRN

## 2013-06-17 MED ORDER — MIDAZOLAM HCL 2 MG/2ML IJ SOLN
INTRAMUSCULAR | Status: AC
  Filled 2013-06-17: qty 2

## 2013-06-17 MED ORDER — THROMBIN 5000 UNITS EX SOLR
CUTANEOUS | Status: AC
  Filled 2013-06-17: qty 10000

## 2013-06-17 MED ORDER — METHOCARBAMOL 100 MG/ML IJ SOLN
500.0000 mg | Freq: Four times a day (QID) | INTRAVENOUS | Status: DC | PRN
  Administered 2013-06-17 – 2013-06-19 (×3): 500 mg via INTRAVENOUS
  Filled 2013-06-17 (×3): qty 5

## 2013-06-17 MED ORDER — ROCURONIUM BROMIDE 100 MG/10ML IV SOLN
INTRAVENOUS | Status: DC | PRN
  Administered 2013-06-17: 10 mg via INTRAVENOUS
  Administered 2013-06-17: 60 mg via INTRAVENOUS
  Administered 2013-06-17: 20 mg via INTRAVENOUS

## 2013-06-17 MED ORDER — ONDANSETRON HCL 4 MG/2ML IJ SOLN
INTRAMUSCULAR | Status: DC | PRN
  Administered 2013-06-17: 4 mg via INTRAVENOUS

## 2013-06-17 MED ORDER — NITROGLYCERIN 0.4 MG SL SUBL: 0.4000 mg | SUBLINGUAL_TABLET | SUBLINGUAL | Status: DC | PRN

## 2013-06-17 MED ORDER — PHENOL 1.4 % MT LIQD: 1.0000 | OROMUCOSAL | Status: DC | PRN

## 2013-06-17 MED ORDER — FLUTICASONE PROPIONATE 50 MCG/ACT NA SUSP
1.0000 | Freq: Every day | NASAL | Status: DC | PRN
  Filled 2013-06-17: qty 16

## 2013-06-17 MED ORDER — HYDROMORPHONE HCL PF 1 MG/ML IJ SOLN
INTRAMUSCULAR | Status: AC
  Filled 2013-06-17: qty 1

## 2013-06-17 MED ORDER — OXYCODONE-ACETAMINOPHEN 5-325 MG PO TABS
1.0000 | ORAL_TABLET | ORAL | Status: DC | PRN
  Administered 2013-06-18 – 2013-06-21 (×10): 2 via ORAL
  Filled 2013-06-17 (×10): qty 2

## 2013-06-17 MED ORDER — FLEET ENEMA 7-19 GM/118ML RE ENEM: 1.0000 | ENEMA | Freq: Once | RECTAL | Status: AC | PRN

## 2013-06-17 MED ORDER — HYDROMORPHONE HCL PF 1 MG/ML IJ SOLN
0.2500 mg | INTRAMUSCULAR | Status: DC | PRN
  Administered 2013-06-17 (×2): 0.5 mg via INTRAVENOUS
  Administered 2013-06-17: 0.25 mg via INTRAVENOUS

## 2013-06-17 MED ORDER — LIDOCAINE HCL (CARDIAC) 20 MG/ML IV SOLN
INTRAVENOUS | Status: DC | PRN
  Administered 2013-06-17: 50 mg via INTRAVENOUS
  Administered 2013-06-17: 100 mg via INTRAVENOUS

## 2013-06-17 MED ORDER — ONDANSETRON HCL 4 MG/2ML IJ SOLN
INTRAMUSCULAR | Status: AC
  Filled 2013-06-17: qty 2

## 2013-06-17 MED ORDER — FENTANYL CITRATE 0.05 MG/ML IJ SOLN
INTRAMUSCULAR | Status: DC | PRN
  Administered 2013-06-17: 50 ug via INTRAVENOUS
  Administered 2013-06-17 (×2): 100 ug via INTRAVENOUS

## 2013-06-17 MED ORDER — PROPOFOL 10 MG/ML IV BOLUS
INTRAVENOUS | Status: DC | PRN
  Administered 2013-06-17: 200 mg via INTRAVENOUS

## 2013-06-17 MED ORDER — LIDOCAINE HCL (CARDIAC) 20 MG/ML IV SOLN
INTRAVENOUS | Status: AC
  Filled 2013-06-17: qty 5

## 2013-06-17 MED ORDER — INSULIN ASPART 100 UNIT/ML ~~LOC~~ SOLN
0.0000 [IU] | Freq: Three times a day (TID) | SUBCUTANEOUS | Status: DC
  Administered 2013-06-18 – 2013-06-19 (×3): 2 [IU] via SUBCUTANEOUS
  Administered 2013-06-19 – 2013-06-21 (×4): 3 [IU] via SUBCUTANEOUS

## 2013-06-17 MED ORDER — ROCURONIUM BROMIDE 100 MG/10ML IV SOLN
INTRAVENOUS | Status: AC
  Filled 2013-06-17: qty 1

## 2013-06-17 MED ORDER — SODIUM CHLORIDE 0.9 % IV SOLN
10.0000 mg | INTRAVENOUS | Status: DC | PRN
  Administered 2013-06-17: 20 ug/min via INTRAVENOUS

## 2013-06-17 MED ORDER — GLYCOPYRROLATE 0.2 MG/ML IJ SOLN
INTRAMUSCULAR | Status: DC | PRN
  Administered 2013-06-17: 0.2 mg via INTRAVENOUS
  Administered 2013-06-17: .6 mg via INTRAVENOUS

## 2013-06-17 MED ORDER — CEFAZOLIN SODIUM 1-5 GM-% IV SOLN
1.0000 g | Freq: Three times a day (TID) | INTRAVENOUS | Status: AC
  Administered 2013-06-17 – 2013-06-18 (×3): 1 g via INTRAVENOUS
  Filled 2013-06-17 (×3): qty 50

## 2013-06-17 MED ORDER — PROPOFOL 10 MG/ML IV BOLUS
INTRAVENOUS | Status: AC
  Filled 2013-06-17: qty 20

## 2013-06-17 MED ORDER — BACITRACIN-NEOMYCIN-POLYMYXIN 400-5-5000 EX OINT
TOPICAL_OINTMENT | CUTANEOUS | Status: AC
  Filled 2013-06-17: qty 1

## 2013-06-17 MED ORDER — ATORVASTATIN CALCIUM 80 MG PO TABS
80.0000 mg | ORAL_TABLET | Freq: Every day | ORAL | Status: DC
Start: 2013-06-18 — End: 2013-06-21
  Administered 2013-06-18 – 2013-06-21 (×4): 80 mg via ORAL
  Filled 2013-06-17 (×4): qty 1

## 2013-06-17 MED ORDER — ALUM & MAG HYDROXIDE-SIMETH 200-200-20 MG/5ML PO SUSP
30.0000 mL | Freq: Four times a day (QID) | ORAL | Status: DC | PRN
  Administered 2013-06-17: 30 mL via ORAL
  Filled 2013-06-17: qty 30

## 2013-06-17 MED ORDER — LACTATED RINGERS IV SOLN
INTRAVENOUS | Status: DC | PRN
  Administered 2013-06-17 (×2): via INTRAVENOUS

## 2013-06-17 MED ORDER — PHENYLEPHRINE HCL 10 MG/ML IJ SOLN
INTRAMUSCULAR | Status: AC
  Filled 2013-06-17: qty 1

## 2013-06-17 MED ORDER — ACETAMINOPHEN 325 MG PO TABS: 650.0000 mg | ORAL_TABLET | ORAL | Status: DC | PRN

## 2013-06-17 MED ORDER — POLYETHYLENE GLYCOL 3350 17 G PO PACK
17.0000 g | PACK | Freq: Every day | ORAL | Status: DC | PRN
  Administered 2013-06-19 – 2013-06-20 (×3): 17 g via ORAL
  Filled 2013-06-17 (×3): qty 1

## 2013-06-17 MED ORDER — FENTANYL CITRATE 0.05 MG/ML IJ SOLN
INTRAMUSCULAR | Status: AC
  Filled 2013-06-17: qty 5

## 2013-06-17 MED ORDER — METFORMIN HCL 500 MG PO TABS
500.0000 mg | ORAL_TABLET | Freq: Two times a day (BID) | ORAL | Status: DC
  Administered 2013-06-18 – 2013-06-21 (×7): 500 mg via ORAL
  Filled 2013-06-17 (×10): qty 1

## 2013-06-17 MED ORDER — HYDROCODONE-ACETAMINOPHEN 5-325 MG PO TABS
1.0000 | ORAL_TABLET | ORAL | Status: DC | PRN
  Administered 2013-06-17 – 2013-06-20 (×3): 2 via ORAL
  Filled 2013-06-17 (×4): qty 2

## 2013-06-17 MED ORDER — METOPROLOL SUCCINATE ER 50 MG PO TB24
150.0000 mg | ORAL_TABLET | Freq: Every day | ORAL | Status: DC
  Administered 2013-06-18 – 2013-06-21 (×4): 150 mg via ORAL
  Filled 2013-06-17 (×4): qty 1

## 2013-06-17 MED ORDER — GLYCOPYRROLATE 0.2 MG/ML IJ SOLN
INTRAMUSCULAR | Status: AC
  Filled 2013-06-17: qty 1

## 2013-06-17 MED ORDER — SODIUM CHLORIDE 0.9 % IR SOLN
Status: DC | PRN
  Administered 2013-06-17: 14:00:00

## 2013-06-17 MED ORDER — BISACODYL 10 MG RE SUPP
10.0000 mg | Freq: Every day | RECTAL | Status: DC | PRN
  Administered 2013-06-20: 10 mg via RECTAL
  Filled 2013-06-17: qty 1

## 2013-06-17 MED ORDER — HYDROMORPHONE HCL PF 1 MG/ML IJ SOLN
0.5000 mg | INTRAMUSCULAR | Status: DC | PRN
  Administered 2013-06-17 – 2013-06-20 (×19): 1 mg via INTRAVENOUS
  Filled 2013-06-17 (×19): qty 1

## 2013-06-17 MED ORDER — FUROSEMIDE 40 MG PO TABS
40.0000 mg | ORAL_TABLET | Freq: Two times a day (BID) | ORAL | Status: DC
  Administered 2013-06-17 – 2013-06-21 (×8): 40 mg via ORAL
  Filled 2013-06-17 (×11): qty 1

## 2013-06-17 MED ORDER — LACTATED RINGERS IV SOLN
INTRAVENOUS | Status: DC
  Administered 2013-06-17 – 2013-06-18 (×3): via INTRAVENOUS

## 2013-06-17 MED ORDER — LACTATED RINGERS IV SOLN: INTRAVENOUS | Status: DC

## 2013-06-17 MED ORDER — BUPIVACAINE LIPOSOME 1.3 % IJ SUSP
20.0000 mL | Freq: Once | INTRAMUSCULAR | Status: AC
  Administered 2013-06-17: 20 mL
  Filled 2013-06-17: qty 20

## 2013-06-17 MED ORDER — MENTHOL 3 MG MT LOZG
1.0000 | LOZENGE | OROMUCOSAL | Status: DC | PRN
  Administered 2013-06-17: 21:00:00 3 mg via ORAL
  Filled 2013-06-17 (×2): qty 9

## 2013-06-17 MED ORDER — ATROPINE SULFATE 1 MG/ML IJ SOLN
INTRAMUSCULAR | Status: AC
  Administered 2013-06-17: 1 mg
  Filled 2013-06-17: qty 1

## 2013-06-17 MED ORDER — BUPIVACAINE-EPINEPHRINE PF 0.5-1:200000 % IJ SOLN
INTRAMUSCULAR | Status: DC | PRN
  Administered 2013-06-17: 20 mL

## 2013-06-17 MED ORDER — MIDAZOLAM HCL 5 MG/5ML IJ SOLN
INTRAMUSCULAR | Status: DC | PRN
  Administered 2013-06-17: 2 mg via INTRAVENOUS
  Administered 2013-06-17: 100 mg via INTRAVENOUS

## 2013-06-17 MED ORDER — ACETAMINOPHEN 650 MG RE SUPP: 650.0000 mg | RECTAL | Status: DC | PRN

## 2013-06-17 MED ORDER — LACTATED RINGERS IV SOLN
INTRAVENOUS | Status: DC
  Administered 2013-06-17: 12:00:00 via INTRAVENOUS

## 2013-06-17 MED ORDER — POTASSIUM CHLORIDE CRYS ER 20 MEQ PO TBCR
20.0000 meq | EXTENDED_RELEASE_TABLET | Freq: Two times a day (BID) | ORAL | Status: DC
  Administered 2013-06-17 – 2013-06-21 (×8): 20 meq via ORAL
  Filled 2013-06-17 (×10): qty 1

## 2013-06-17 MED ORDER — DEXTROSE 5 % IV SOLN
3.0000 g | INTRAVENOUS | Status: AC
  Administered 2013-06-17: 3 g via INTRAVENOUS
  Filled 2013-06-17 (×2): qty 3000

## 2013-06-17 MED ORDER — PROMETHAZINE HCL 25 MG/ML IJ SOLN: 6.2500 mg | INTRAMUSCULAR | Status: DC | PRN

## 2013-06-17 MED ORDER — AMLODIPINE BESYLATE 5 MG PO TABS
5.0000 mg | ORAL_TABLET | Freq: Every day | ORAL | Status: DC
  Administered 2013-06-18 – 2013-06-21 (×4): 5 mg via ORAL
  Filled 2013-06-17 (×4): qty 1

## 2013-06-17 SURGICAL SUPPLY — 44 items
APL SKNCLS STERI-STRIP NONHPOA (GAUZE/BANDAGES/DRESSINGS) ×1
BAG SPEC THK2 15X12 ZIP CLS (MISCELLANEOUS) ×1
BAG ZIPLOCK 12X15 (MISCELLANEOUS) ×2 IMPLANT
BENZOIN TINCTURE PRP APPL 2/3 (GAUZE/BANDAGES/DRESSINGS) ×2 IMPLANT
CLEANER TIP ELECTROSURG 2X2 (MISCELLANEOUS) ×2 IMPLANT
CONT SPECI 4OZ STER CLIK (MISCELLANEOUS) ×2 IMPLANT
DRAIN PENROSE 18X1/4 LTX STRL (WOUND CARE) IMPLANT
DRAPE MICROSCOPE LEICA (MISCELLANEOUS) ×2 IMPLANT
DRAPE POUCH INSTRU U-SHP 10X18 (DRAPES) ×2 IMPLANT
DRAPE SURG 17X11 SM STRL (DRAPES) ×2 IMPLANT
DRSG ADAPTIC 3X8 NADH LF (GAUZE/BANDAGES/DRESSINGS) ×2 IMPLANT
DURAPREP 26ML APPLICATOR (WOUND CARE) ×2 IMPLANT
ELECT BLADE TIP CTD 4 INCH (ELECTRODE) ×1 IMPLANT
ELECT REM PT RETURN 9FT ADLT (ELECTROSURGICAL) ×2
ELECTRODE REM PT RTRN 9FT ADLT (ELECTROSURGICAL) ×1 IMPLANT
GLOVE BIOGEL PI IND STRL 8 (GLOVE) ×2 IMPLANT
GLOVE BIOGEL PI INDICATOR 8 (GLOVE) ×2
GLOVE ECLIPSE 8.0 STRL XLNG CF (GLOVE) ×4 IMPLANT
GOWN PREVENTION PLUS LG XLONG (DISPOSABLE) ×6 IMPLANT
GOWN STRL REIN XL XLG (GOWN DISPOSABLE) ×4 IMPLANT
KIT BASIN OR (CUSTOM PROCEDURE TRAY) ×2 IMPLANT
KIT POSITIONING SURG ANDREWS (MISCELLANEOUS) ×2 IMPLANT
MANIFOLD NEPTUNE II (INSTRUMENTS) ×2 IMPLANT
NDL SPNL 18GX3.5 QUINCKE PK (NEEDLE) ×2 IMPLANT
NEEDLE SPNL 18GX3.5 QUINCKE PK (NEEDLE) ×4 IMPLANT
NS IRRIG 1000ML POUR BTL (IV SOLUTION) ×2 IMPLANT
PAD ABD 8X10 STRL (GAUZE/BANDAGES/DRESSINGS) ×2 IMPLANT
PATTIES SURGICAL .5 X.5 (GAUZE/BANDAGES/DRESSINGS) IMPLANT
PATTIES SURGICAL .75X.75 (GAUZE/BANDAGES/DRESSINGS) IMPLANT
PATTIES SURGICAL 1X1 (DISPOSABLE) IMPLANT
PIN SAFETY NICK PLATE  2 MED (MISCELLANEOUS)
PIN SAFETY NICK PLATE 2 MED (MISCELLANEOUS) IMPLANT
POSITIONER SURGICAL ARM (MISCELLANEOUS) ×2 IMPLANT
SPONGE GAUZE 4X4 12PLY (GAUZE/BANDAGES/DRESSINGS) ×1 IMPLANT
SPONGE LAP 4X18 X RAY DECT (DISPOSABLE) IMPLANT
SPONGE SURGIFOAM ABS GEL 100 (HEMOSTASIS) ×2 IMPLANT
STAPLER VISISTAT 35W (STAPLE) IMPLANT
SUT VIC AB 0 CT1 27 (SUTURE) ×2
SUT VIC AB 0 CT1 27XBRD ANTBC (SUTURE) ×1 IMPLANT
SUT VIC AB 1 CT1 27 (SUTURE) ×8
SUT VIC AB 1 CT1 27XBRD ANTBC (SUTURE) ×4 IMPLANT
TOWEL OR 17X26 10 PK STRL BLUE (TOWEL DISPOSABLE) ×4 IMPLANT
TRAY LAMINECTOMY (CUSTOM PROCEDURE TRAY) ×2 IMPLANT
WATER STERILE IRR 1500ML POUR (IV SOLUTION) ×2 IMPLANT

## 2013-06-17 NOTE — OR Nursing (Signed)
L shape bruising noted on the left outer thigh when transporting patient from the operating table to patients stretcher.

## 2013-06-17 NOTE — Anesthesia Postprocedure Evaluation (Signed)
  Anesthesia Post-op Note  Patient: Richard [name]  Procedure(s) Performed: Procedure(s) (LRB): LUMBAR LAMINECTOMY MICRODISCECTOMY L4-L5 RIGHT EXCISION OF SYNOVIAL CYST RIGHT   (1 LEVEL) RIGHT PARTIAL FACETECTOMY (Right)  Patient Location: PACU  Anesthesia Type: General  Level of Consciousness: awake and alert   Airway and Oxygen Therapy: Patient Spontanous Breathing  Post-op Pain: mild  Post-op Assessment: Post-op Vital signs reviewed, Patient's Cardiovascular Status Stable, Respiratory Function Stable, Patent Airway and No signs of Nausea or vomiting  Last Vitals:  Filed Vitals:   06/17/13 1635  BP: 113/70  Pulse: 52  Temp: 36.4 C  Resp: 19    Post-op Vital Signs: stable   Complications: No apparent anesthesia complications

## 2013-06-17 NOTE — Preoperative (Signed)
Beta Blockers   Reason not to administer Beta Blockers:Not Applicable 

## 2013-06-17 NOTE — Anesthesia Preprocedure Evaluation (Addendum)
Anesthesia Evaluation  Patient identified by MRN, date of birth, ID band Patient awake    Reviewed: Allergy & Precautions, H&P , NPO status , Patient's Chart, lab work & pertinent test results  Airway Mallampati: II TM Distance: >3 FB Neck ROM: Full    Dental no notable dental hx.    Pulmonary sleep apnea , COPD breath sounds clear to auscultation  Pulmonary exam normal       Cardiovascular Exercise Tolerance: Good hypertension, Pt. on medications and Pt. on home beta blockers + CAD Rhythm:Regular Rate:Normal     Neuro/Psych  Neuromuscular disease negative psych ROS   GI/Hepatic Neg liver ROS, hiatal hernia, GERD-  ,  Endo/Other  diabetes, Type 2, Oral Hypoglycemic Agents  Renal/GU negative Renal ROS  negative genitourinary   Musculoskeletal negative musculoskeletal ROS (+)   Abdominal (+) + obese,   Peds negative pediatric ROS (+)  Hematology  (+) anemia ,   Anesthesia Other Findings   Reproductive/Obstetrics negative OB ROS                          Anesthesia Physical Anesthesia Plan  ASA: III  Anesthesia Plan: General   Post-op Pain Management:    Induction: Intravenous  Airway Management Planned: Oral ETT  Additional Equipment:   Intra-op Plan:   Post-operative Plan: Extubation in OR  Informed Consent: I have reviewed the patients History and Physical, chart, labs and discussed the procedure including the risks, benefits and alternatives for the proposed anesthesia with the patient or authorized representative who has indicated his/her understanding and acceptance.   Dental advisory given  Plan Discussed with: CRNA  Anesthesia Plan Comments:         Anesthesia Quick Evaluation

## 2013-06-17 NOTE — Transfer of Care (Signed)
Immediate Anesthesia Transfer of Care Note  Patient: Richard [name]  Procedure(s) Performed: Procedure(s): LUMBAR LAMINECTOMY MICRODISCECTOMY L4-L5 RIGHT EXCISION OF SYNOVIAL CYST RIGHT   (1 LEVEL) RIGHT PARTIAL FACETECTOMY (Right)  Patient Location: PACU  Anesthesia Type:General  Level of Consciousness: awake, alert  and oriented  Airway & Oxygen Therapy: Patient Spontanous Breathing and Patient connected to face mask oxygen  Post-op Assessment: Report given to PACU RN and Post -op Vital signs reviewed and stable  Post vital signs: Reviewed and stable  Complications: No apparent anesthesia complications

## 2013-06-17 NOTE — Interval H&P Note (Signed)
History and Physical Interval Note:  06/17/2013 12:44 PM  Richard [name]  has presented today for surgery, with the diagnosis of HERNIATED DISC SYNOVIAL CYST   The various methods of treatment have been discussed with the patient and family. After consideration of risks, benefits and other options for treatment, the patient has consented to  Procedure(s): LUMBAR LAMINECTOMY MICRODISCECTOMY L4-L5 RIGHT EXCISION OF SYNOVIAL CYST RIGHT   (1 LEVEL)  (Right) as a surgical intervention .  The patient's history has been reviewed, patient examined, no change in status, stable for surgery.  I have reviewed the patient's chart and labs.  Questions were answered to the patient's satisfaction.     Serria Sloma A

## 2013-06-17 NOTE — Brief Op Note (Signed)
06/17/2013  2:56 PM  PATIENT:  Richard [name]  72 y.o. male  PRE-OPERATIVE DIAGNOSIS:  SYNOVIAL CYST at L-4-L-5 on the right.Spinal and Foraminal Stenosis.  POST-OPERATIVE DIAGNOSIS:   SYNOVIAL CYST at L-4-L-5 on the Right. Spinal Stenosis L-4-L-5 and Foraminal Stenosis for L-4 and L-5 Nerve Roots on the Right.  PROCEDURE:  Decompressive Lumbar Laminectomy and Partial Facetectomy L-4-L-5 on the right for Spinal Stenosis. Foraminotomies for L-4 and L-5 Nerve roots on the Right.Excision of a Large Synovial Cyst at L-4-L-5 on the right SURGEON:  Surgeon(s) and Role:    * Tobi Bastos, MD - Primary    * Magnus Sinning, MD - Assisting  :   ASSISTANTS:James Aplington MD  ANESTHESIA:   general  EBL:  Total I/O In: 1000 [I.V.:1000] Out: -   BLOOD ADMINISTERED:none  DRAINS: none   LOCAL MEDICATIONS USED:  MARCAINE 20cc of 0.50% with Epinephrine at start of case and Exparel 20 cc at end of case.     SPECIMEN:  No Specimen  DISPOSITION OF SPECIMEN:  N/A  COUNTS:  YES  TOURNIQUET:  * No tourniquets in log *  DICTATION: .Other Dictation: Dictation Number [id number]  PLAN OF CARE: Admit for overnight observation  PATIENT DISPOSITION:  PACU - hemodynamically stable.   Delay start of Pharmacological VTE agent (>24hrs) due to surgical blood loss or risk of bleeding: yes

## 2013-06-18 ENCOUNTER — Encounter (HOSPITAL_COMMUNITY): Payer: Self-pay | Admitting: Orthopedic Surgery

## 2013-06-18 LAB — GLUCOSE, CAPILLARY
Glucose-Capillary: 150 mg/dL — ABNORMAL HIGH (ref 70–99)
Glucose-Capillary: 127 mg/dL — ABNORMAL HIGH (ref 70–99)

## 2013-06-18 MED ORDER — OXYCODONE-ACETAMINOPHEN 5-325 MG PO TABS: 1.0000 | ORAL_TABLET | ORAL | Status: DC | PRN

## 2013-06-18 MED ORDER — METHOCARBAMOL 500 MG PO TABS: 500.0000 mg | ORAL_TABLET | Freq: Four times a day (QID) | ORAL | Status: DC | PRN

## 2013-06-18 MED ORDER — PNEUMOCOCCAL VAC POLYVALENT 25 MCG/0.5ML IJ INJ
0.5000 mL | INJECTION | INTRAMUSCULAR | Status: AC
  Administered 2013-06-20: 11:00:00 0.5 mL via INTRAMUSCULAR
  Filled 2013-06-18 (×2): qty 0.5

## 2013-06-18 NOTE — Evaluation (Signed)
Occupational Therapy Evaluation Patient Details Name: Richard [name] MRN: [id number] DOB: 24-Nov-1940 Today's Date: 06/18/2013 Time: [id number] OT Time Calculation (min): 37 min  OT Assessment / Plan / Recommendation History of present illness 72 yo male s/p L4-L5 lumbar laminectomry, microdiscectomy, excision of synovial cyst.    Clinical Impression   All education completed and reviewed back precautions/reinforced with ADL. Has all DME and daughter can assist PRN at d/c. Will sign off.    OT Assessment  Patient does not need any further OT services    Follow Up Recommendations  No OT follow up;Supervision - Intermittent    Barriers to Discharge      Equipment Recommendations  None recommended by OT    Recommendations for Other Services    Frequency       Precautions / Restrictions Precautions Precautions: Back Precaution Booklet Issued: Yes (comment) Precaution Comments: Educated on back precautions and logroll technique for bed mobility Restrictions Weight Bearing Restrictions: No   Pertinent Vitals/Pain 610 back and LEs (reposition, rest)    ADL  Eating/Feeding: Simulated;Independent Where Assessed - Eating/Feeding: Chair Grooming: Simulated;Wash/dry hands;Set up Where Assessed - Grooming: Supported sitting Upper Body Bathing: Simulated;Right arm;Chest;Left arm;Abdomen;Set up;Supervision/safety Where Assessed - Upper Body Bathing: Unsupported sitting Lower Body Bathing: Simulated;Moderate assistance (without AE) Where Assessed - Lower Body Bathing: Supported sit to stand Upper Body Dressing: Simulated;Set up;Supervision/safety Where Assessed - Upper Body Dressing: Unsupported sitting Lower Body Dressing: Simulated;Moderate assistance (without AE) Where Assessed - Lower Body Dressing: Supported sit to stand Toilet Transfer: Publishing copy: Bedside commode (to simulate taller toilet like at his house) [place] and Hygiene: Simulated;Supervision/safety Where [place] and Hygiene: Sit on 3-in-1 or toilet Equipment Used: Long-handled shoe horn;Long-handled sponge;Reacher;Rolling walker;Sock aid ADL Comments: Educated pt on all AE options and coverage. Pt interested in obtaining AE kit as he is unable to cross LEs up to don clothing. Demonstrated/verbalized how to use all AE and pt verbalized understanding. He is able to reach posterior periarea in sitting for hygiene adhering to back precautions. Practiced with 3in1 for increased height similar to his taller toilets at his daughters and use of handle only on the R of potty chair to simulate use of vanity beside. He did well with toilet transfer. Reviewed back care handout and precautions in length and demonstrated shower stall transfer and he has a seat he can use.     OT Diagnosis:    OT Problem List:   OT Treatment Interventions:     OT Goals(Current goals can be found in the care plan section) Acute Rehab OT Goals Patient Stated Goal: regain independence. less pain  Visit Information  Last OT Received On: 06/18/13 Assistance Needed: +1 History of Present Illness: 72 yo male s/p L4-L5 lumbar laminectomry, microdiscectomy, excision of synovial cyst.        Prior [place] expects to be discharged to:: Private residence Living Arrangements: Children (staying at daughters) Available Help at Discharge: Family Type of Home: House Home Access: Stairs to enter Technical brewer of Steps: 3 Entrance Stairs-Rails: None Home Layout: Able to live on main level with bedroom/bathroom Home Equipment: Walker - 2 wheels;Shower seat;Adaptive equipment Adaptive Equipment: Reacher Prior Function Level of Independence: Independent Communication Communication: No difficulties         Vision/Perception     Cognition  Cognition Arousal/Alertness:  Awake/alert Behavior During Therapy: WFL for tasks assessed/performed Overall Cognitive Status: Within Functional Limits  for tasks assessed    Extremity/Trunk Assessment Upper Extremity Assessment Upper Extremity Assessment: Overall WFL for tasks assessed Lower Extremity Assessment Lower Extremity Assessment: Overall WFL for tasks assessed Cervical / Trunk Assessment Cervical / Trunk Assessment: Normal     Mobility Bed Mobility Bed Mobility: Not assessed Details for Bed Mobility Assistance: pt sitting in recliner. Pt states he slept in recliner last night due to severe discomfort/pain while in bed.  Transfers Transfers: Sit to Stand;Stand to Sit Sit to Stand: 5: Supervision;From chair/3-in-1;With armrests Stand to Sit: 5: Supervision;To chair/3-in-1;With armrests Details for Transfer Assistance: VCs safety, hand placement.      Exercise     Balance Balance Balance Assessed: Yes Static Standing Balance Static Standing - Level of Assistance: 5: Stand by assistance   End of Session OT - End of Session Activity Tolerance: Patient tolerated treatment well Patient left: in chair;with call bell/phone within reach  GO     Jules Schick [id number] 06/18/2013, 10:47 AM

## 2013-06-18 NOTE — Progress Notes (Signed)
Call to Dr. Gladstone Lighter regarding pt's discharge home and pain control. This am Dr. Gladstone Lighter assessed pt and stated his PA would return to discharge patient in the early afternoon. Dr. Gladstone Lighter was aware that pt had pain control issues all night, pain was better controlled this am, but has increased since 12:30. Gave PRN pain medication, and repositioned. Pt now states pain is in legs. Will await orders from MD.

## 2013-06-18 NOTE — Progress Notes (Signed)
Pt setup on Auto Cpap with 2 LPM O2 bleed in per pt home regimen.  Pt tolerating well at this time, RT to monitor and assess as needed.

## 2013-06-18 NOTE — Progress Notes (Signed)
Spoke with pt concerning Home Health needs. Pt states that he has no needs at present time. Pt plan to discharge home with his daughter. No DME needed, pt has RW at home.

## 2013-06-18 NOTE — Evaluation (Signed)
Physical Therapy Evaluation-1x eval Patient Details Name: Richard [name] MRN: [id number] DOB: 08-Mar-1941 Today's Date: 06/18/2013 Time: [id number] PT Time Calculation (min): 24 min  PT Assessment / Plan / Recommendation History of Present Illness  72 yo male s/p L4-L5 lumbar laminectomry, microdiscectomy, excision of synovial cyst.   Clinical Impression  On eval, pt required Supervision level assist for mobility-able to ambulate ~175 feet with walker. Still somewhat limited by pain-pt rates as 6/10 in bil posterior legs, R worse than L. All education completed. Back handout issued. Discussed car transfer and stair negotiation. Plan is for d/c home with daughter later today. 1x eval.     PT Assessment  Patent does not need any further PT services    Follow Up Recommendations  No PT follow up;Supervision/Assistance - 24 hour    Does the patient have the potential to tolerate intense rehabilitation      Barriers to Discharge        Equipment Recommendations  None recommended by PT (pt states he has RW available)    Recommendations for Other Services OT consult   Frequency      Precautions / Restrictions Precautions Precautions: Back Precaution Booklet Issued: Yes (comment) Precaution Comments: Educated on back precautions and logroll technique for bed mobility Restrictions Weight Bearing Restrictions: No   Pertinent Vitals/Pain 6-7/10 with mobility.       Mobility  Bed Mobility Bed Mobility: Not assessed Details for Bed Mobility Assistance: pt sitting in recliner. Pt states he slept in recliner last night due to severe discomfort/pain while in bed.  Transfers Transfers: Sit to Stand;Stand to Sit Sit to Stand: 5: Supervision;From chair/3-in-1;With armrests Stand to Sit: 5: Supervision;To chair/3-in-1;With armrests Details for Transfer Assistance: VCs safety, hand placement.  Ambulation/Gait Ambulation/Gait Assistance: 5: Supervision Ambulation Distance (Feet): 175  Feet Assistive device: Rolling walker Ambulation/Gait Assistance Details: VCs posture. slow gait speed. Several brief standing rest breaks due to posterior leg pain (R worse than L).  Gait Pattern: Step-through pattern;Trunk flexed Stairs: No (Educated on technique and for pt to have assistance. )    Exercises     PT Diagnosis:    PT Problem List:   PT Treatment Interventions:       PT Goals(Current goals can be found in the care plan section) Acute Rehab PT Goals Patient Stated Goal: regain independence. less pain PT Goal Formulation: No goals set, d/c therapy  Visit Information  Last PT Received On: 06/18/13 Assistance Needed: +1 History of Present Illness: 72 yo male s/p L4-L5 lumbar laminectomry, microdiscectomy, excision of synovial cyst.        Prior [place] expects to be discharged to:: Private residence Living Arrangements: Children (staying at daughters) Available Help at Discharge: Family Type of Home: House Home Access: Stairs to enter Technical brewer of Steps: 3 Entrance Stairs-Rails: None Home Layout: Able to live on main level with bedroom/bathroom Home Equipment: Walker - 2 wheels;Shower seat Prior Function Level of Independence: Independent Communication Communication: No difficulties    Cognition  Cognition Arousal/Alertness: Awake/alert Behavior During Therapy: WFL for tasks assessed/performed Overall Cognitive Status: Within Functional Limits for tasks assessed    Extremity/Trunk Assessment Upper Extremity Assessment Upper Extremity Assessment: Defer to OT evaluation Lower Extremity Assessment Lower Extremity Assessment: Overall WFL for tasks assessed Cervical / Trunk Assessment Cervical / Trunk Assessment: Normal   Balance    End of Session PT - End of Session Activity Tolerance: Patient tolerated treatment well Patient left: in chair;with call bell/phone within  reach  GP     Weston Anna,  MPT Pager: [phone]

## 2013-06-18 NOTE — Progress Notes (Signed)
CSW received consult for SNF placement, note PT evaluation recommended no PT follow-up.   No further CSW needs identified - CSW signing off.   Winfred Leeds, [facility] Clinical Social Worker cell #: [phone]

## 2013-06-18 NOTE — Progress Notes (Signed)
UR completed 

## 2013-06-18 NOTE — Op Note (Signed)
NAME:  ALCIDE, MARCELO NO.:  [id number]  MEDICAL RECORD NO.:  [id number]  LOCATION:  [id number]                         FACILITY:  [facility]  PHYSICIAN:  Kipp Brood. Braelynn Lupton, M.D.DATE OF BIRTH:  January 14, 1941  DATE OF PROCEDURE:  06/17/2013 DATE OF DISCHARGE:                              OPERATIVE REPORT   SURGEON:  Kipp Brood. Gladstone Lighter, MD  ASSISTANT:  Tarri Glenn, MD  PREOPERATIVE DIAGNOSES: 1. Severe lateral recess stenosis with foraminal stenosis at L4-5 on     the right. 2. Large synovial cyst at L4-5 on the right.  POSTOPERATIVE DIAGNOSES: 1. Severe lateral recess stenosis with foraminal stenosis at L4-5 on     the right. 2. Large synovial cyst at L4-5 on the right.  OPERATION: 1. Decompression and lateral recess at L4-5 on the right for severe     lateral recess stenosis. 2. Foraminotomies to the L5 root and the L4 root on the right. 3. Partial facetectomy at L4-5 on the right. 4. Excision of a large synovial cyst at L4-5 on the right. 5. Microscopic excision of the synovial cyst.  PROCEDURE:  Under general anesthesia, routine orthopedic prep and draping, the lower back was carried out with the patient on spinal frame.  The appropriate time-out was first carried out.  I also marked the right side of his back in the holding area.  At this time, after the time-out, 2 needles were placed in the back for localization purposes. The patient did have 2 g of IV Ancef.  At this point, x-ray was taken with 2 needles in place.  Once we established the interspace, we then made an incision over the L4-5 interspace.  Bleeders were identified and cauterized.  I then separated the muscle from the lamina in usual fashion.  Another x-ray was taken with instruments in place.  Following that, I then stripped the muscle from the lamina at L4-5 on the right. The McCullough retractors were inserted.  At this time, we then carried out our hemilaminectomy in the usual fashion at L4-5  on the right.  I had to do a partial facetectomy because of the marked overgrowth of the facet and because of the large synovial cyst.  The microscope was used. Once we brought the microscope in, we gently stripped the ligamentum flavum off of the dura.  We protected the dura with cottonoids at all time.  Immediately upon going down to the 4-5 space, we noted a extra large synovial cyst compressing the sac.  We gently peeled to assist free from the dura and excised most of the small remnant of the cyst remaining, so we did notch completely so that we did not tear the dura. We did foraminotomies for the 4 root and the 5 root.  We made sure the dura and the roots were wide open and they were.  We went proximal and distal in a dissection until we made sure that we were able to easily pass a hockey-stick in both directions.  Multiple x-rays were taken to verify the position.  At this particular time, we then thoroughly irrigated out the area, reinspected the dura several times as well as the foramina to  make sure we now had freedom of motion and we had completely decompressed the area.  We had to go far out laterally when we did a partial facetectomy obviously.  We then loosely applied some thrombin-soaked Gelfoam and closed the wound layers in usual fashion except I left a small distal deep and proximal deep part of the wound open for drainage purposes.  Subcu was closed with 0 Vicryl after I injected 20 mL of Exparel into the wound site.  Note, at the beginning of the procedure, I injected 20 mL of 0.5% Marcaine with epinephrine in the soft tissue to control bleeding.  The subcu was closed with 0 Vicryl, the skin with metal staples.  Sterile Neosporin dressing was applied.  The patient left the operating room in satisfactory condition.          ______________________________ Kipp Brood Gladstone Lighter, M.D.     RAG/MEDQ  D:  06/17/2013  T:  06/18/2013  Job:  [id number]

## 2013-06-18 NOTE — Progress Notes (Signed)
Pt placed on Auto CPAP with 2 LPM O2 bleed in via nasal mask.  Pt tolerating well at this time, RT to monitor and assess as needed.

## 2013-06-18 NOTE — Progress Notes (Signed)
Subjective: 1 Day Post-Op Procedure(s) (LRB): LUMBAR LAMINECTOMY MICRODISCECTOMY L4-L5 RIGHT EXCISION OF SYNOVIAL CYST RIGHT   (1 LEVEL) RIGHT PARTIAL FACETECTOMY (Right) Patient reports pain as 6 on 0-10 scale.Doing well except for Muscle Spasms. Good motor strength.    Objective: Vital signs in last 24 hours: Temp:  [97.5 F (36.4 C)-99.6 F (37.6 C)] 98.7 F (37.1 C) (12/19 0359) Pulse Rate:  [31-86] 81 (12/19 0359) Resp:  [14-24] 20 (12/19 0359) BP: (111-157)/(52-78) 149/78 mmHg (12/19 0359) SpO2:  [93 %-100 %] 93 % (12/19 0359) Weight:  [126.1 kg (278 lb)] 126.1 kg (278 lb) (12/18 1635)  Intake/Output from previous day: 12/18 0701 - 12/19 0700 In: 3571.7 [P.O.:600; I.V.:2866.7; IV Piggyback:105] Out: 525 [Urine:475; Blood:50] Intake/Output this shift:    No results found for this basename: HGB,  in the last 72 hours No results found for this basename: WBC, RBC, HCT, PLT,  in the last 72 hours No results found for this basename: NA, K, CL, CO2, BUN, CREATININE, GLUCOSE, CALCIUM,  in the last 72 hours No results found for this basename: LABPT, INR,  in the last 72 hours  Dorsiflexion/Plantar flexion intact Compartment soft  Assessment/Plan: 1 Day Post-Op Procedure(s) (LRB): LUMBAR LAMINECTOMY MICRODISCECTOMY L4-L5 RIGHT EXCISION OF SYNOVIAL CYST RIGHT   (1 LEVEL) RIGHT PARTIAL FACETECTOMY (Right) Discharge home with home health  Joshua Soulier A 06/18/2013, 7:29 AM

## 2013-06-18 NOTE — Progress Notes (Signed)
Order from Dr. Gladstone Lighter to cancel Discharge home. Pt happy with decision to stay in the hospital tonight, as he feels he would be unable to manage pain at home.

## 2013-06-19 LAB — URINALYSIS, ROUTINE W REFLEX MICROSCOPIC
Bilirubin Urine: NEGATIVE
Hgb urine dipstick: NEGATIVE
Ketones, ur: 15 mg/dL — AB
Leukocytes, UA: NEGATIVE
Nitrite: NEGATIVE
Protein, ur: NEGATIVE mg/dL
pH: 6 (ref 5.0–8.0)

## 2013-06-19 LAB — GLUCOSE, CAPILLARY: Glucose-Capillary: 161 mg/dL — ABNORMAL HIGH (ref 70–99)

## 2013-06-19 MED ORDER — GABAPENTIN 300 MG PO CAPS
300.0000 mg | ORAL_CAPSULE | Freq: Three times a day (TID) | ORAL | Status: DC
  Administered 2013-06-19 – 2013-06-21 (×7): 300 mg via ORAL
  Filled 2013-06-19 (×9): qty 1

## 2013-06-19 NOTE — Progress Notes (Signed)
Subjective: 2 Days Post-Op Procedure(s) (LRB): LUMBAR LAMINECTOMY MICRODISCECTOMY L4-L5 RIGHT EXCISION OF SYNOVIAL CYST RIGHT   (1 LEVEL) RIGHT PARTIAL FACETECTOMY (Right) Patient reports pain as moderate to severe.  D/C was held yesterday due to increased leg pain after walking with PT. Overnight he reported urinary retention and bladder fullness, bladder scanned then in & out cath for 1200cc urine. He does report improvement in pressure.  He does c/o some back pain this morning but his biggest c/o is B/L leg pain, buttocks and posterior thighs. He denies numbness, tingling. He reports pain worse with walking and is better when sitting or lying down. At this point he still does not feel he will be ready for D/C home due to pain. When he gets the pain medication, relief is only for a few minutes. Slight nausea but no other c/o this AM.  Objective: Vital signs in last 24 hours: Temp:  [97.7 F (36.5 C)-99.4 F (37.4 C)] 99.4 F (37.4 C) (12/20 0431) Pulse Rate:  [77-100] 100 (12/20 0431) Resp:  [20-24] 24 (12/20 0431) BP: (122-157)/(58-76) 157/76 mmHg (12/20 0431) SpO2:  [91 %-97 %] 97 % (12/20 0431)  Intake/Output from previous day: 12/19 0701 - 12/20 0700 In: 1365 [P.O.:960; I.V.:350; IV Piggyback:55] Out: 2060 [Urine:2060] Intake/Output this shift:    No results found for this basename: HGB,  in the last 72 hours No results found for this basename: WBC, RBC, HCT, PLT,  in the last 72 hours No results found for this basename: NA, K, CL, CO2, BUN, CREATININE, GLUCOSE, CALCIUM,  in the last 72 hours No results found for this basename: LABPT, INR,  in the last 72 hours  Neurologically intact ABD soft Neurovascular intact Sensation intact distally Intact pulses distally Dorsiflexion/Plantar flexion intact Incision: dressing C/D/I and no drainage No cellulitis present Compartment soft no calf pain or sign of DVT No evidence of cauda equina syndrome B/L buttock pain with  SLR  Assessment/Plan: 2 Days Post-Op Procedure(s) (LRB): LUMBAR LAMINECTOMY MICRODISCECTOMY L4-L5 RIGHT EXCISION OF SYNOVIAL CYST RIGHT   (1 LEVEL) RIGHT PARTIAL FACETECTOMY (Right) Advance diet Up with therapy D/C IV fluids Will add neurontin to help alleviate his leg pain Discussed urinary retention, likely due to anesthesia and narcotics, will continue to monitor with bladder scans if unable to void, PVRs Continue PT today, Lspine precautions, walker for support Discussed possible D/C later today but more likely tomorrow based on his level of pain this AM Discussed with Dr. Alfonse Ras, [place]. 06/19/2013, 7:25 AM

## 2013-06-19 NOTE — Progress Notes (Signed)
Patient staed that he felt like there was urine in the bladder after voiding.  Patient had a post void residual done which yielded over 870 ml according to the bladder scanner. The answering service was called and an order was obtained from the PA/MD on call. In and Out cath was performed and urine was taken to lab for urine culture and urinalysis.  Post I&O cath there was a yield of 1200 ml of urine. Patient stated :"that was the best thing you could have ever done"

## 2013-06-19 NOTE — Progress Notes (Signed)
Patient complains that CPAP does not feel as "powerful as mine at home". Discovered pressure settings to be on auto with min 7 max 20. Education provided. Pressure changed made to min 10 max 20. He agrees the new settings have a better feel for him. He is encouraged to call for further assistance if needed.

## 2013-06-20 LAB — URINE CULTURE
Colony Count: NO GROWTH
Culture: NO GROWTH

## 2013-06-20 LAB — GLUCOSE, CAPILLARY
Glucose-Capillary: 114 mg/dL — ABNORMAL HIGH (ref 70–99)
Glucose-Capillary: 153 mg/dL — ABNORMAL HIGH (ref 70–99)
Glucose-Capillary: 168 mg/dL — ABNORMAL HIGH (ref 70–99)
Glucose-Capillary: 225 mg/dL — ABNORMAL HIGH (ref 70–99)

## 2013-06-20 MED ORDER — PREDNISONE (PAK) 5 MG PO TABS
5.0000 mg | ORAL_TABLET | ORAL | Status: AC
  Administered 2013-06-20: 5 mg via ORAL

## 2013-06-20 MED ORDER — SALINE SPRAY 0.65 % NA SOLN
1.0000 | NASAL | Status: DC | PRN
  Filled 2013-06-20: qty 44

## 2013-06-20 MED ORDER — PREDNISONE (PAK) 5 MG PO TABS
5.0000 mg | ORAL_TABLET | ORAL | Status: AC
  Administered 2013-06-20: 17:00:00 5 mg via ORAL

## 2013-06-20 MED ORDER — PREDNISONE (PAK) 5 MG PO TABS
10.0000 mg | ORAL_TABLET | Freq: Every morning | ORAL | Status: AC
  Administered 2013-06-20: 11:00:00 10 mg via ORAL
  Filled 2013-06-20: qty 21

## 2013-06-20 MED ORDER — PREDNISONE (PAK) 5 MG PO TABS
5.0000 mg | ORAL_TABLET | Freq: Three times a day (TID) | ORAL | Status: DC
  Administered 2013-06-21 (×2): 5 mg via ORAL

## 2013-06-20 MED ORDER — PREDNISONE (PAK) 5 MG PO TABS
10.0000 mg | ORAL_TABLET | Freq: Every evening | ORAL | Status: DC
Start: 2013-06-21 — End: 2013-06-21

## 2013-06-20 MED ORDER — PREDNISONE (PAK) 5 MG PO TABS
10.0000 mg | ORAL_TABLET | Freq: Every evening | ORAL | Status: AC
  Administered 2013-06-20: 22:00:00 10 mg via ORAL

## 2013-06-20 MED ORDER — PREDNISONE (PAK) 5 MG PO TABS: 5.0000 mg | ORAL_TABLET | Freq: Four times a day (QID) | ORAL | Status: DC

## 2013-06-20 NOTE — Progress Notes (Signed)
Pt was finally able to void on his own, clear yellow urine.

## 2013-06-20 NOTE — Progress Notes (Signed)
Subjective: 3 Days Post-Op Procedure(s) (LRB): LUMBAR LAMINECTOMY MICRODISCECTOMY L4-L5 RIGHT EXCISION OF SYNOVIAL CYST RIGHT   (1 LEVEL) RIGHT PARTIAL FACETECTOMY (Right) Patient reports pain as 4 on 0-10 scale. His back pain is much better. He is having trouble with Constipa]tion and leg pain with walking. He states that he cant take care of himself at home alone and desires to go to SNF.Dressing changed and wound looks fine.Will start on Prednisone today.  Objective: Vital signs in last 24 hours: Temp:  [98.3 F (36.8 C)-100.3 F (37.9 C)] 99 F (37.2 C) (12/21 0534) Pulse Rate:  [69-97] 70 (12/21 0534) Resp:  [20-22] 20 (12/21 0534) BP: (109-147)/(50-92) 133/56 mmHg (12/21 0534) SpO2:  [92 %-96 %] 92 % (12/21 0534)  Intake/Output from previous day: 12/20 0701 - 12/21 0700 In: 1320 [P.O.:1320] Out: 2800 [Urine:2800] Intake/Output this shift:    No results found for this basename: HGB,  in the last 72 hours No results found for this basename: WBC, RBC, HCT, PLT,  in the last 72 hours No results found for this basename: NA, K, CL, CO2, BUN, CREATININE, GLUCOSE, CALCIUM,  in the last 72 hours No results found for this basename: LABPT, INR,  in the last 72 hours  Neurologically intact  Assessment/Plan: 3 Days Post-Op Procedure(s) (LRB): LUMBAR LAMINECTOMY MICRODISCECTOMY L4-L5 RIGHT EXCISION OF SYNOVIAL CYST RIGHT   (1 LEVEL) RIGHT PARTIAL FACETECTOMY (Right) Discharge to SNF,tomorrow.  Richard [name] A 06/20/2013, 8:42 AM

## 2013-06-21 LAB — GLUCOSE, CAPILLARY: Glucose-Capillary: 151 mg/dL — ABNORMAL HIGH (ref 70–99)

## 2013-06-21 MED ORDER — FLEET ENEMA 7-19 GM/118ML RE ENEM
1.0000 | ENEMA | Freq: Once | RECTAL | Status: AC
  Administered 2013-06-21: 1 via RECTAL
  Filled 2013-06-21: qty 1

## 2013-06-21 MED ORDER — DOXAZOSIN MESYLATE 2 MG PO TABS
2.0000 mg | ORAL_TABLET | Freq: Every day | ORAL | Status: DC
  Administered 2013-06-21: 2 mg via ORAL
  Filled 2013-06-21: qty 1

## 2013-06-21 MED ORDER — GABAPENTIN 300 MG PO CAPS: 300.0000 mg | ORAL_CAPSULE | Freq: Three times a day (TID) | ORAL | Status: DC

## 2013-06-21 MED ORDER — DOXAZOSIN MESYLATE 2 MG PO TABS: 2.0000 mg | ORAL_TABLET | Freq: Every day | ORAL | Status: DC

## 2013-06-21 NOTE — Discharge Summary (Signed)
Physician Discharge Summary   Patient ID: Richard [name] MRN: [id number] DOB/AGE: 74-Feb-1942 74 y.o.  Admit date: 06/17/2013 Discharge date: 06/21/2013  Primary Diagnosis: Lumbar spinal stenosis with synovial cyst Admission Diagnoses:  Past Medical History  Diagnosis Date  . Gout   . Esophageal stricture     X 4  . Mixed hyperlipidemia   . Peptic stricture of esophagus   . Antritis (stomach)     mild  . Duodenitis     chronic  . Diverticulosis     mild, left colon  . Anemia   . DM (diabetes mellitus)   . HTN (hypertension)   . Sliding hiatal hernia   . Allergy   . Arthritis     bil knees  . GERD (gastroesophageal reflux disease)   . S/P CABG x 5 2001  . CAD (coronary artery disease)     followed by Dr. Verl Blalock  . HNP (herniated nucleus pulposus), lumbar   . Nocturia   . Sleep apnea     uses c pap with 2L O2 at night   Discharge Diagnoses:   Active Problems:   Spinal stenosis, lumbar region, with neurogenic claudication   Synovial cyst of lumbar facet joint  Estimated body mass index is 37.7 kg/(m^2) as calculated from the following:   Height as of this encounter: 6' (1.829 m).   Weight as of this encounter: 126.1 kg (278 lb).  Procedure:  Procedure(s) (LRB): LUMBAR LAMINECTOMY MICRODISCECTOMY L4-L5 RIGHT EXCISION OF SYNOVIAL CYST RIGHT   (1 LEVEL) RIGHT PARTIAL FACETECTOMY (Right)   Consults: None  HPI: Richard [name] presented to Dr. Charlestine Night office with the chief complaint of back pain. He has had this discomfort for about 8 months. He did not have a specific injury. He has been having pain in the low back that radiates into the right lower extremity. He denies numbness and tingling. He has not had any change in bladder and bowel function. He has had conservative treatment including Prednisone and epidural steroid injection with no relief. CT shows a large synovial cyst at L4-L5 with spinal stenosis and disc herniation.  Laboratory Data: Admission on 06/17/2013    Component Date Value Range Status  . Glucose-Capillary 06/17/2013 106* 70 - 99 mg/dL Final  . Comment 1 06/17/2013 Documented in Chart   Final  . Glucose-Capillary 06/17/2013 109* 70 - 99 mg/dL Final  . Glucose-Capillary 06/17/2013 111* 70 - 99 mg/dL Final  . Comment 1 06/17/2013 Documented in Chart   Final  . Comment 2 06/17/2013 Notify RN   Final  . Glucose-Capillary 06/17/2013 113* 70 - 99 mg/dL Final  . Glucose-Capillary 06/17/2013 108* 70 - 99 mg/dL Final  . Comment 1 06/17/2013 Documented in Chart   Final  . Comment 2 06/17/2013 Notify RN   Final  . Glucose-Capillary 06/18/2013 150* 70 - 99 mg/dL Final  . Glucose-Capillary 06/18/2013 127* 70 - 99 mg/dL Final  . Glucose-Capillary 06/18/2013 119* 70 - 99 mg/dL Final  . Glucose-Capillary 06/18/2013 118* 70 - 99 mg/dL Final  . Comment 1 06/18/2013 Documented in Chart   Final  . Comment 2 06/18/2013 Notify RN   Final  . Specimen Description 06/19/2013 URINE, CATHETERIZED   Final  . Special Requests 06/19/2013 NONE   Final  . Culture  Setup Time 06/19/2013    Final                   Value:06/19/2013 18:54  Performed at [facility]  . Colony Count 06/19/2013    Final                   Value:NO GROWTH                         Performed at [facility]  . Culture 06/19/2013    Final                   Value:NO GROWTH                         Performed at [facility]  . Report Status 06/19/2013 06/20/2013 FINAL   Final  . Color, Urine 06/19/2013 YELLOW  YELLOW Final  . APPearance 06/19/2013 CLEAR  CLEAR Final  . Specific Gravity, Urine 06/19/2013 1.010  1.005 - 1.030 Final  . pH 06/19/2013 6.0  5.0 - 8.0 Final  . Glucose, UA 06/19/2013 NEGATIVE  NEGATIVE mg/dL Final  . Hgb urine dipstick 06/19/2013 NEGATIVE  NEGATIVE Final  . Bilirubin Urine 06/19/2013 NEGATIVE  NEGATIVE Final  . Ketones, ur 06/19/2013 15* NEGATIVE mg/dL Final  . Protein, ur 06/19/2013 NEGATIVE  NEGATIVE mg/dL Final  .  Urobilinogen, UA 06/19/2013 1.0  0.0 - 1.0 mg/dL Final  . Nitrite 06/19/2013 NEGATIVE  NEGATIVE Final  . Leukocytes, UA 06/19/2013 NEGATIVE  NEGATIVE Final   MICROSCOPIC NOT DONE ON URINES WITH NEGATIVE PROTEIN, BLOOD, LEUKOCYTES, NITRITE, OR GLUCOSE <1000 mg/dL.  [name] Glucose-Capillary 06/19/2013 139* 70 - 99 mg/dL Final  . Glucose-Capillary 06/19/2013 99  70 - 99 mg/dL Final  . Glucose-Capillary 06/19/2013 161* 70 - 99 mg/dL Final  . Glucose-Capillary 06/19/2013 140* 70 - 99 mg/dL Final  . Comment 1 06/19/2013 Documented in Chart   Final  . Comment 2 06/19/2013 Notify RN   Final  . Glucose-Capillary 06/20/2013 114* 70 - 99 mg/dL Final  . Glucose-Capillary 06/20/2013 153* 70 - 99 mg/dL Final  . Glucose-Capillary 06/20/2013 168* 70 - 99 mg/dL Final  . Glucose-Capillary 06/20/2013 225* 70 - 99 mg/dL Final  Hospital Outpatient Visit on 06/10/2013  Component Date Value Range Status  . MRSA, PCR 06/10/2013 NEGATIVE  NEGATIVE Final  . Staphylococcus aureus 06/10/2013 NEGATIVE  NEGATIVE Final   Comment:                                 The Xpert SA Assay (FDA                          approved for NASAL specimens                          in patients over 64 years of age),                          is one component of                          a comprehensive surveillance                          program.  Test performance has  been validated by [facility] for patients greater                          than or equal to 24 year old.                          It is not intended                          to diagnose infection nor to                          guide or monitor treatment.  . WBC 06/10/2013 5.9  4.0 - 10.5 K/uL Final  . RBC 06/10/2013 4.84  4.22 - 5.81 MIL/uL Final  . Hemoglobin 06/10/2013 12.8* 13.0 - 17.0 g/dL Final  . HCT 06/10/2013 39.8  39.0 - 52.0 % Final  . MCV 06/10/2013 82.2  78.0 - 100.0 fL Final  . MCH 06/10/2013 26.4  26.0 -  34.0 pg Final  . MCHC 06/10/2013 32.2  30.0 - 36.0 g/dL Final  . RDW 06/10/2013 16.0* 11.5 - 15.5 % Final  . Platelets 06/10/2013 210  150 - 400 K/uL Final  . aPTT 06/10/2013 39* 24 - 37 seconds Final   Comment:                                 IF BASELINE aPTT IS ELEVATED,                          SUGGEST PATIENT RISK ASSESSMENT                          BE USED TO DETERMINE APPROPRIATE                          ANTICOAGULANT THERAPY.  . Sodium 06/10/2013 138  135 - 145 mEq/L Final  . Potassium 06/10/2013 4.3  3.5 - 5.1 mEq/L Final  . Chloride 06/10/2013 102  96 - 112 mEq/L Final  . CO2 06/10/2013 26  19 - 32 mEq/L Final  . Glucose, Bld 06/10/2013 143* 70 - 99 mg/dL Final  . BUN 06/10/2013 16  6 - 23 mg/dL Final  . Creatinine, Ser 06/10/2013 1.04  0.50 - 1.35 mg/dL Final  . Calcium 06/10/2013 9.3  8.4 - 10.5 mg/dL Final  . Total Protein 06/10/2013 7.0  6.0 - 8.3 g/dL Final  . Albumin 06/10/2013 3.7  3.5 - 5.2 g/dL Final  . AST 06/10/2013 17  0 - 37 U/L Final  . ALT 06/10/2013 16  0 - 53 U/L Final  . Alkaline Phosphatase 06/10/2013 125* 39 - 117 U/L Final  . Total Bilirubin 06/10/2013 0.4  0.3 - 1.2 mg/dL Final  . GFR calc non Af Amer 06/10/2013 69* >90 mL/min Final  . GFR calc Af Amer 06/10/2013 80* >90 mL/min Final   Comment: (NOTE)                          The eGFR has been  calculated using the CKD EPI equation.                          This calculation has not been validated in all clinical situations.                          eGFR's persistently <90 mL/min signify possible Chronic Kidney                          Disease.  [name] Prothrombin Time 06/10/2013 14.2  11.6 - 15.2 seconds Final  . INR 06/10/2013 1.12  0.00 - 1.49 Final  . Color, Urine 06/10/2013 YELLOW  YELLOW Final  . APPearance 06/10/2013 CLEAR  CLEAR Final  . Specific Gravity, Urine 06/10/2013 1.010  1.005 - 1.030 Final  . pH 06/10/2013 6.0  5.0 - 8.0 Final  . Glucose, UA 06/10/2013 NEGATIVE  NEGATIVE mg/dL Final  .  Hgb urine dipstick 06/10/2013 NEGATIVE  NEGATIVE Final  . Bilirubin Urine 06/10/2013 NEGATIVE  NEGATIVE Final  . Ketones, ur 06/10/2013 NEGATIVE  NEGATIVE mg/dL Final  . Protein, ur 06/10/2013 NEGATIVE  NEGATIVE mg/dL Final  . Urobilinogen, UA 06/10/2013 0.2  0.0 - 1.0 mg/dL Final  . Nitrite 06/10/2013 NEGATIVE  NEGATIVE Final  . Leukocytes, UA 06/10/2013 NEGATIVE  NEGATIVE Final   MICROSCOPIC NOT DONE ON URINES WITH NEGATIVE PROTEIN, BLOOD, LEUKOCYTES, NITRITE, OR GLUCOSE <1000 mg/dL.  Appointment on 05/18/2013  Component Date Value Range Status  . Microalb, Ur 05/18/2013 0.1  0.0 - 1.9 mg/dL Final  . Creatinine,U 05/18/2013 51.8   Final  . Microalb Creat Ratio 05/18/2013 0.2  0.0 - 30.0 mg/g Final  . Hemoglobin A1C 05/18/2013 7.7* 4.6 - 6.5 % Final   Glycemic Control Guidelines for People with Diabetes:Non Diabetic:  <6%Goal of Therapy: <7%Additional Action Suggested:  >8%   . Sodium 05/18/2013 141  135 - 145 mEq/L Final  . Potassium 05/18/2013 4.3  3.5 - 5.1 mEq/L Final  . Chloride 05/18/2013 103  96 - 112 mEq/L Final  . CO2 05/18/2013 29  19 - 32 mEq/L Final  . Glucose, Bld 05/18/2013 129* 70 - 99 mg/dL Final  . BUN 05/18/2013 17  6 - 23 mg/dL Final  . Creatinine, Ser 05/18/2013 1.0  0.4 - 1.5 mg/dL Final  . Calcium 05/18/2013 9.5  8.4 - 10.5 mg/dL Final  . GFR 05/18/2013 76.58  >60.00 mL/min Final     X-Rays:Dg Chest 2 View  06/10/2013   CLINICAL DATA:  Preoperative for lumbar surgery, history of previous CABG  EXAM: CHEST  2 VIEW  COMPARISON:  Chest x-ray June 08, 2011  FINDINGS: The lungs are adequately inflated. There is no focal infiltrate. The cardiopericardial silhouette is top-normal in size. The patient has undergone median sternotomy and CABG. 7 intact sternal wires are visible. There is no pleural effusion or alveolar infiltrate. The pulmonary vascularity is mildly prominent centrally but stable. The observed portions of the bony thorax exhibit no acute abnormalities.   IMPRESSION: There is no evidence of pneumonia or acute pulmonary edema. Minimal prominence of the interstitial markings in both lungs is stable.   Electronically Signed   By: David  Martinique   On: 06/10/2013 13:56   Dg Lumbar Spine 2-3 Views  06/10/2013   CLINICAL DATA:  Preop lumbar surgery.  EXAM: LUMBAR SPINE - 2-3 VIEW  COMPARISON:  Myelogram and Postmyelogram CT 04/06/2013.  FINDINGS:  Utilizing level assignment from prior myelogram, last fully open disc space is labeled L5-S1. There are anterior osteophytes at the L1-2, L2-3 and L3-4 disc space level.  Vascular calcifications. Caliber of aorta incompletely assessed on the present exam.  IMPRESSION: Utilizing level assignment from prior myelogram, last fully open disc space is labeled L5-S1. There are anterior osteophytes at the L1-2, L2-3 and L3-4 disc space level.  Vascular calcifications. Caliber of aorta incompletely assessed on the present exam.   Electronically Signed   By: Chauncey Cruel M.D.   On: 06/10/2013 12:52   Dg Spine Portable 1 View  06/17/2013   CLINICAL DATA:  Intraoperative localization.  L4-L5 surgical level.  EXAM: PORTABLE SPINE - 1 VIEW  COMPARISON:  06/17/2013, 1340 hr.  FINDINGS: Soft tissue retractors are present dorsal the L4 and L5. Superior probe is present with the tip immediately dorsal to the L4-L5 interspace, likely in the interspinous space. The inferior probe is just inferior to the L5 pedicles, likely against the lamina.  IMPRESSION: Intraoperative localization as above.  Probes at the L4-L5 level.   Electronically Signed   By: Dereck Ligas M.D.   On: 06/17/2013 14:50   Dg Spine Portable 1 View  06/17/2013   CLINICAL DATA:  L4-L5 surgical level.  EXAM: PORTABLE SPINE - 1 VIEW  COMPARISON:  06/17/2013 at 1321 hr.  FINDINGS: Intraoperative localization views are submitted. These demonstrate clamps over the L4 and L5 spinous processes with the probe in the L4-L5 interspinous region. Spinous processes were labile.   IMPRESSION: Intraoperative localization at L4-L5.   Electronically Signed   By: Dereck Ligas M.D.   On: 06/17/2013 13:57   Dg Spine Portable 1 View  06/17/2013   CLINICAL DATA:  Lumbar surgery.  EXAM: PORTABLE SPINE - 1 VIEW  COMPARISON:  06/10/2013.  FINDINGS: Metallic markers noted at the L4-L5 disc space level. Degenerative changes lumbar spine. Aortoiliac atherosclerotic vascular disease.  IMPRESSION: Metallic markers at [id number] disc space level.   Electronically Signed   By: Marcello Moores  Register   On: 06/17/2013 13:51    Hospital Course: Richard [name] is a 72 y.o. who was admitted to [facility]. They were brought to the operating room on 06/17/2013 and underwent Procedure(s): LUMBAR LAMINECTOMY MICRODISCECTOMY L4-L5 RIGHT EXCISION OF SYNOVIAL CYST RIGHT   (1 LEVEL) RIGHT PARTIAL FACETECTOMY.  Patient tolerated the procedure well and was later transferred to the recovery room and then to the orthopaedic floor for postoperative care.  They were given PO and IV analgesics for pain control following their surgery.  They were given 24 hours of postoperative antibiotics of  Anti-infectives   Start     Dose/Rate Route Frequency Ordered Stop   06/17/13 2000  ceFAZolin (ANCEF) IVPB 1 g/50 mL premix     1 g 100 mL/hr over 30 Minutes Intravenous 3 times per day 06/17/13 1649 06/18/13 1530   06/17/13 1337  polymyxin B 500,000 Units, bacitracin 50,000 Units in sodium chloride irrigation 0.9 % 500 mL irrigation  Status:  Discontinued       As needed 06/17/13 1337 06/17/13 1507   06/17/13 1000  ceFAZolin (ANCEF) 3 g in dextrose 5 % 50 mL IVPB     3 g 160 mL/hr over 30 Minutes Intravenous On call to O.R. 06/17/13 [id number] 06/17/13 1255     and started on DVT prophylaxis in the form of Aspirin.   PT was ordered for gait training  Discharge planning consulted to help with postop disposition and  equipment needs.  Patient had a fair night on the evening of surgery.  They started to get up OOB with therapy  on day one.  Continued to work with therapy into day two. Patient began to develop increased discomfort in his legs when walking. He was started on Neurontin.  Dressing was changed on day two and the incision was clean and dry.  By day three, the patient continued to progress poorly with therapy.  Incision was healing well.  Patient was seen in rounds post op day four and was feeling much better, able to go home. He continued to have trouble with voiding and slow moving bowels. Fleet enema was ordered.    Discharge Medications: Prior to Admission medications   Medication Sig Start Date End Date Taking? Authorizing Provider  allopurinol (ZYLOPRIM) 300 MG tablet Take 300 mg by mouth 1 day or 1 dose.    Yes Historical Provider, MD  amLODipine (NORVASC) 5 MG tablet Take 5 mg by mouth daily with breakfast.   Yes Historical Provider, MD  Ascorbic Acid (VITAMIN C) 500 MG tablet Take 500 mg by mouth every morning.    Yes Historical Provider, MD  aspirin 81 MG tablet Take 81 mg by mouth daily.   Yes Historical Provider, MD  atorvastatin (LIPITOR) 80 MG tablet Take 80 mg by mouth daily with breakfast.   Yes Historical Provider, MD  CINNAMON PO Take 2 tablets by mouth daily.   Yes Historical Provider, MD  Coenzyme Q10 (CO Q-10 PO) Take 1 tablet by mouth daily.   Yes Historical Provider, MD  cyanocobalamin ([employer] VITAMIN B12) 1000 MCG tablet Take 1 tablet (1,000 mcg total) by mouth daily. 04/30/12  Yes Gatha Mayer, MD  esomeprazole (NEXIUM) 40 MG capsule Take 1 capsule (40 mg total) by mouth daily. 10/15/12  Yes [name], MD  ezetimibe (ZETIA) 10 MG tablet Take 10 mg by mouth every morning.   Yes Historical Provider, MD  fexofenadine (ALLEGRA) 180 MG tablet Take 90 mg by mouth 2 (two) times daily.    Yes Historical Provider, MD  Flaxseed, Linseed, (FLAX SEED OIL) 1300 MG CAPS Take 1 tablet by mouth daily.   Yes Historical Provider, MD  fluticasone (FLONASE) 50 MCG/ACT nasal spray Place 1 spray into  both nostrils daily as needed for allergies or rhinitis.   Yes Historical Provider, MD  furosemide (LASIX) 40 MG tablet Take 40 mg by mouth 2 (two) times daily. 01/30/13  Yes Renella Cunas, MD  Lancets (FREESTYLE) lancets Test blood sugar daily as instructed. Dx: 250.00 05/31/13  Yes [name], MD  metFORMIN (GLUCOPHAGE) 500 MG tablet Take 500 mg by mouth 2 (two) times daily with a meal. 04/08/13  Yes [name], MD  metoprolol succinate (TOPROL-XL) 100 MG 24 hr tablet Take 150 mg by mouth daily with breakfast. Take with or immediately following a meal.   Yes Historical Provider, MD  nitroGLYCERIN (NITROSTAT) 0.4 MG SL tablet Place 0.4 mg under the tongue every 5 (five) minutes as needed for chest pain.   Yes Historical Provider, MD  Omega-3 Fatty Acids (OMEGA 3 PO) Take 1 tablet by mouth daily.   Yes Historical Provider, MD  potassium chloride SA (K-DUR,KLOR-CON) 20 MEQ tablet Take 20 mEq by mouth 2 (two) times daily. 06/09/13  Yes Minus Breeding, MD  psyllium (HYDROCIL/METAMUCIL) 95 % PACK Take 1 packet by mouth 2 (two) times daily.   Yes Historical Provider, MD  gabapentin (NEURONTIN) 300 MG capsule Take  1 capsule (300 mg total) by mouth 3 (three) times daily. 06/21/13   Kamauri Kathol Renelda Loma, PA-C  methocarbamol (ROBAXIN) 500 MG tablet Take 1 tablet (500 mg total) by mouth every 6 (six) hours as needed for muscle spasms. 06/18/13   Aston Lieske Renelda Loma, PA-C  oxyCODONE-acetaminophen (PERCOCET/ROXICET) 5-325 MG per tablet Take 1-2 tablets by mouth every 4 (four) hours as needed for moderate pain. 06/18/13   Marcoantonio Legault Renelda Loma, PA-C  Prednisone pak prescribed as well  Diet: Diabetic diet Activity:WBAT Follow-up:in 2 weeks from surgery date Disposition - Home Discharged Condition: stable   Discharge Orders   Future Orders Complete By Expires   Call MD / Call 911  As directed    Comments:     If you experience chest pain or shortness of breath, CALL 911 and be transported to  the hospital emergency room.  If you develope a fever above 101 F, pus (white drainage) or increased drainage or redness at the wound, or calf pain, call your surgeon's office.   Constipation Prevention  As directed    Comments:     Drink plenty of fluids.  Prune juice may be helpful.  You may use a stool softener, such as Colace (over the counter) 100 mg twice a day.  Use MiraLax (over the counter) for constipation as needed.   Diet Carb Modified  As directed    Discharge instructions  As directed    Comments:     You may shower starting Saturday For the first few days, remove your dressing, tape a piece of saran wrap over your incision, take your shower, then remove the saran wrap and put a clean dressing on. After two days you can shower without the saran wrap.  Change your dressing daily. Shower only, no tub bath. Call if any temperatures greater than 101 or any wound complications: [phone] during the day and ask for Dr. Charlestine Night nurse, Brunilda Payor.   Driving restrictions  As directed    Comments:     No driving for 1 week or while on pain medication   Increase activity slowly as tolerated  As directed    Lifting restrictions  As directed    Comments:     No lifting       Medication List         allopurinol 300 MG tablet  Commonly known as:  ZYLOPRIM  Take 300 mg by mouth 1 day or 1 dose.     amLODipine 5 MG tablet  Commonly known as:  NORVASC  Take 5 mg by mouth daily with breakfast.     aspirin 81 MG tablet  Take 81 mg by mouth daily.     atorvastatin 80 MG tablet  Commonly known as:  LIPITOR  Take 80 mg by mouth daily with breakfast.     CINNAMON PO  Take 2 tablets by mouth daily.     CO Q-10 PO  Take 1 tablet by mouth daily.     cyanocobalamin 1000 MCG tablet  Commonly known as:  CVS VITAMIN B12  Take 1 tablet (1,000 mcg total) by mouth daily.     esomeprazole 40 MG capsule  Commonly known as:  NEXIUM  Take 1 capsule (40 mg total) by mouth daily.      ezetimibe 10 MG tablet  Commonly known as:  ZETIA  Take 10 mg by mouth every morning.     fexofenadine 180 MG tablet  Commonly known as:  ALLEGRA  Take 90 mg  by mouth 2 (two) times daily.     Flax Seed Oil 1300 MG Caps  Take 1 tablet by mouth daily.     fluticasone 50 MCG/ACT nasal spray  Commonly known as:  FLONASE  Place 1 spray into both nostrils daily as needed for allergies or rhinitis.     freestyle lancets  Test blood sugar daily as instructed. Dx: 250.00     furosemide 40 MG tablet  Commonly known as:  LASIX  Take 40 mg by mouth 2 (two) times daily.     gabapentin 300 MG capsule  Commonly known as:  NEURONTIN  Take 1 capsule (300 mg total) by mouth 3 (three) times daily.     metFORMIN 500 MG tablet  Commonly known as:  GLUCOPHAGE  Take 500 mg by mouth 2 (two) times daily with a meal.     methocarbamol 500 MG tablet  Commonly known as:  ROBAXIN  Take 1 tablet (500 mg total) by mouth every 6 (six) hours as needed for muscle spasms.     metoprolol succinate 100 MG 24 hr tablet  Commonly known as:  TOPROL-XL  Take 150 mg by mouth daily with breakfast. Take with or immediately following a meal.     nitroGLYCERIN 0.4 MG SL tablet  Commonly known as:  NITROSTAT  Place 0.4 mg under the tongue every 5 (five) minutes as needed for chest pain.     OMEGA 3 PO  Take 1 tablet by mouth daily.     oxyCODONE-acetaminophen 5-325 MG per tablet  Commonly known as:  PERCOCET/ROXICET  Take 1-2 tablets by mouth every 4 (four) hours as needed for moderate pain.     potassium chloride SA 20 MEQ tablet  Commonly known as:  K-DUR,KLOR-CON  Take 20 mEq by mouth 2 (two) times daily.     psyllium 95 % Pack  Commonly known as:  HYDROCIL/METAMUCIL  Take 1 packet by mouth 2 (two) times daily.     vitamin C 500 MG tablet  Commonly known as:  ASCORBIC ACID  Take 500 mg by mouth every morning.           Follow-up Information   Follow up with GIOFFRE,RONALD A, MD. Schedule an  appointment as soon as possible for a visit in 2 weeks.   Specialty:  Orthopedic Surgery   Contact information:   [address] [place] [address] [phone]       Signed: Ardeen Jourdain [facility] 06/21/2013, 7:04 AM

## 2013-06-21 NOTE — Progress Notes (Signed)
   Subjective: 4 Days Post-Op Procedure(s) (LRB): LUMBAR LAMINECTOMY MICRODISCECTOMY L4-L5 RIGHT EXCISION OF SYNOVIAL CYST RIGHT   (1 LEVEL) RIGHT PARTIAL FACETECTOMY (Right) Patient reports pain as mild.   Patient seen in rounds without Dr. Gladstone Lighter. Patient is having problems with constipation and urinary retention. he is feeling much better this morning. He reports that he has soreness in his lower back but no leg pain. He is able to get up and walk around his room with his walker without any difficulty. Feels able to go home as opposed to SNF.  Plan is to go Home after hospital stay.  Objective: Vital signs in last 24 hours: Temp:  [97.5 F (36.4 C)-98 F (36.7 C)] 97.5 F (36.4 C) (12/22 0530) Pulse Rate:  [69-72] 69 (12/22 0530) Resp:  [18] 18 (12/22 0530) BP: (119-131)/(57-68) 127/68 mmHg (12/22 0530) SpO2:  [92 %-98 %] 98 % (12/22 0530)  Intake/Output from previous day:  Intake/Output Summary (Last 24 hours) at 06/21/13 0926 Last data filed at 06/21/13 [id number]  Gross per 24 hour  Intake   1440 ml  Output   3750 ml  Net  -2310 ml     EXAM General - Patient is Alert and Oriented Extremity - Neurologically intact Neurovascular intact Dorsiflexion/Plantar flexion intact No cellulitis present Dressing/Incision - clean, dry, no drainage Motor Function - intact, moving foot and toes well on exam.   Past Medical History  Diagnosis Date  . Gout   . Esophageal stricture     X 4  . Mixed hyperlipidemia   . Peptic stricture of esophagus   . Antritis (stomach)     mild  . Duodenitis     chronic  . Diverticulosis     mild, left colon  . Anemia   . DM (diabetes mellitus)   . HTN (hypertension)   . Sliding hiatal hernia   . Allergy   . Arthritis     bil knees  . GERD (gastroesophageal reflux disease)   . S/P CABG x 5 2001  . CAD (coronary artery disease)     followed by Dr. Verl Blalock  . HNP (herniated nucleus pulposus), lumbar   . Nocturia   . Sleep apnea     uses c  pap with 2L O2 at night    Assessment/Plan: 4 Days Post-Op Procedure(s) (LRB): LUMBAR LAMINECTOMY MICRODISCECTOMY L4-L5 RIGHT EXCISION OF SYNOVIAL CYST RIGHT   (1 LEVEL) RIGHT PARTIAL FACETECTOMY (Right) Active Problems:   Spinal stenosis, lumbar region, with neurogenic claudication   Synovial cyst of lumbar facet joint  Estimated body mass index is 37.7 kg/(m^2) as calculated from the following:   Height as of this encounter: 6' (1.829 m).   Weight as of this encounter: 126.1 kg (278 lb). Advance diet Up with therapy D/C IV fluids Discharge home   DVT Prophylaxis - Aspirin Weight-Bearing as tolerated   Richard [name] doing much better than yesterday. We are going to try to get him discharged home today. Will give enema as well as start on doxazosin for urinary retention. Unable to use flomax due to allergy. If patient shows ability to void on his own and has a bowel movement, he can be discharged home today. Discharge instructions given.   Richard [name] [name] 06/21/2013, 9:26 AM

## 2013-06-21 NOTE — Progress Notes (Signed)
After Fleets enema, pt has good size bm and has voided 600cc on his own. PA made aware and confirmed discharge home. Callie Fielding RN

## 2013-08-05 ENCOUNTER — Encounter: Payer: Medicare Other | Admitting: Cardiovascular Disease

## 2013-08-06 ENCOUNTER — Telehealth: Payer: Self-pay | Admitting: *Deleted

## 2013-08-06 MED ORDER — METFORMIN HCL 500 MG PO TABS: 500.0000 mg | ORAL_TABLET | Freq: Two times a day (BID) | ORAL | Status: DC

## 2013-08-06 NOTE — Telephone Encounter (Signed)
Patient called and requested a refill for metFORMIN (GLUCOPHAGE) 500 MG tablet   Pharmacy [facility] [place], [place]

## 2013-08-06 NOTE — Telephone Encounter (Signed)
Rx sent to the pharmacy by e-script.//AB/CMA 

## 2013-08-30 ENCOUNTER — Encounter: Payer: Self-pay | Admitting: Family Medicine

## 2013-08-30 ENCOUNTER — Other Ambulatory Visit: Payer: Self-pay | Admitting: Cardiology

## 2013-08-30 ENCOUNTER — Ambulatory Visit (INDEPENDENT_AMBULATORY_CARE_PROVIDER_SITE_OTHER): Payer: Medicare Other | Admitting: Family Medicine

## 2013-08-30 VITALS — BP 144/82 | HR 56 | Temp 97.7°F | Wt 276.0 lb

## 2013-08-30 DIAGNOSIS — M109 Gout, unspecified: Secondary | ICD-10-CM | POA: Diagnosis not present

## 2013-08-30 DIAGNOSIS — E1159 Type 2 diabetes mellitus with other circulatory complications: Secondary | ICD-10-CM

## 2013-08-30 DIAGNOSIS — I10 Essential (primary) hypertension: Secondary | ICD-10-CM

## 2013-08-30 DIAGNOSIS — E785 Hyperlipidemia, unspecified: Secondary | ICD-10-CM

## 2013-08-30 LAB — POCT URINALYSIS DIPSTICK
Bilirubin, UA: NEGATIVE
Blood, UA: NEGATIVE
Glucose, UA: NEGATIVE
Ketones, UA: NEGATIVE
Leukocytes, UA: NEGATIVE
Nitrite, UA: NEGATIVE
PROTEIN UA: NEGATIVE
SPEC GRAV UA: 1.015
Urobilinogen, UA: 0.2
pH, UA: 6

## 2013-08-30 NOTE — Progress Notes (Signed)
Patient ID: Richard [name], male   DOB: 25-Aug-1940, 73 y.o.   MRN: [id number]   Subjective:    Patient ID: Richard [name], male    DOB: 07/30/1940, 74 y.o.   MRN: [id number] HPI  HPI HYPERTENSION  Blood pressure range-not checking  Chest pain- no      Dyspnea- no Lightheadedness- no   Edema- no Other side effects - no   Medication compliance: good Low salt diet- yes  DIABETES  Blood Sugar ranges-- 142- 150  Polyuria- no New Visual problems- no Hypoglycemic symptoms- no Other side effects-no Medication compliance - good Last eye exam- 08/2013 Foot exam- today  HYPERLIPIDEMIA  Medication compliance- good RUQ pain- no  Muscle aches- no Other side effects-no  ROS See HPI above   PMH Smoking Status noted             Objective:    BP 144/82  Pulse 56  Temp(Src) 97.7 F (36.5 C) (Oral)  Wt 276 lb (125.193 kg)  SpO2 95% General appearance: alert, cooperative, appears stated age and no distress Eyes: conjunctivae/corneas clear. PERRL, EOM's intact. Fundi benign. Throat: lips, mucosa, and tongue normal; teeth and gums normal Neck: no adenopathy, no carotid bruit, no JVD, supple, symmetrical, trachea midline and thyroid not enlarged, symmetric, no tenderness/mass/nodules Lungs: clear to auscultation bilaterally Heart: S1, S2 normal Extremities: extremities normal, atraumatic, no cyanosis or edema        Assessment & Plan:  1. Type II or unspecified type diabetes mellitus with peripheral circulatory disorders, uncontrolled(250.72) Check labs - Basic metabolic panel - Hemoglobin A1c - POCT urinalysis dipstick - Microalbumin / creatinine urine ratio  2. Other and unspecified hyperlipidemia Check labs, con't meds - Hepatic function panel - Lipid panel  3. HTN (hypertension) stable - POCT urinalysis dipstick - Microalbumin / creatinine urine ratio   4. OSA-- on cpap

## 2013-08-30 NOTE — Progress Notes (Signed)
Pre visit review using our clinic review tool, if applicable. No additional management support is needed unless otherwise documented below in the visit note. 

## 2013-08-30 NOTE — Patient Instructions (Signed)
Diabetes and Standards of Medical Care  Diabetes is complicated. You may find that your diabetes team includes a dietitian, nurse, diabetes educator, eye doctor, and more. To help everyone know what is going on and to help you get the care you deserve, the following schedule of care was developed to help keep you on track. Below are the tests, exams, vaccines, medicines, education, and plans you will need. HbA1c test This test shows how well you have controlled your glucose over the past 2 3 months. It is used to see if your diabetes management plan needs to be adjusted.   It is performed at least 2 times a year if you are meeting treatment goals.  It is performed 4 times a year if therapy has changed or if you are not meeting treatment goals. Blood pressure test  This test is performed at every routine medical visit. The goal is less than 140/90 mmHg for most people, but 130/80 mmHg in some cases. Ask your health care provider about your goal. Dental exam  Follow up with the dentist regularly. Eye exam  If you are diagnosed with type 1 diabetes as a child, get an exam upon reaching the age of 10 years or older and have had diabetes for 3 5 years. Yearly eye exams are recommended after that initial eye exam.  If you are diagnosed with type 1 diabetes as an adult, get an exam within 5 years of diagnosis and then yearly.  If you are diagnosed with type 2 diabetes, get an exam as soon as possible after the diagnosis and then yearly. Foot care exam  Visual foot exams are performed at every routine medical visit. The exams check for cuts, injuries, or other problems with the feet.  A comprehensive foot exam should be done yearly. This includes visual inspection as well as assessing foot pulses and testing for loss of sensation.  Check your feet nightly for cuts, injuries, or other problems with your feet. Tell your health care provider if anything is not healing. Kidney function test (urine  microalbumin)  This test is performed once a year.  Type 1 diabetes: The first test is performed 5 years after diagnosis.  Type 2 diabetes: The first test is performed at the time of diagnosis.  A serum creatinine and estimated glomerular filtration rate (eGFR) test is done once a year to assess the level of chronic kidney disease (CKD), if present. Lipid profile (cholesterol, HDL, LDL, triglycerides)  Performed every 5 years for most people.  The goal for LDL is less than 100 mg/dL. If you are at high risk, the goal is less than 70 mg/dL.  The goal for HDL is 40 mg/dL 50 mg/dL for men and 50 mg/dL 60 mg/dL for women. An HDL cholesterol of 60 mg/dL or higher gives some protection against heart disease.  The goal for triglycerides is less than 150 mg/dL. Influenza vaccine, pneumococcal vaccine, and hepatitis B vaccine  The influenza vaccine is recommended yearly.  The pneumococcal vaccine is generally given once in a lifetime. However, there are some instances when another vaccination is recommended. Check with your health care provider.  The hepatitis B vaccine is also recommended for adults with diabetes. Diabetes self-management education  Education is recommended at diagnosis and ongoing as needed. Treatment plan  Your treatment plan is reviewed at every medical visit. Document Released: 04/14/2009 Document Revised: 02/17/2013 Document Reviewed: 11/17/2012 [employer] Patient Information 2014 [employer].  

## 2013-08-31 LAB — LIPID PANEL
CHOL/HDL RATIO: 5
Cholesterol: 149 mg/dL (ref 0–200)
HDL: 31.1 mg/dL — ABNORMAL LOW (ref >39.00)
LDL CALC: 30 mg/dL (ref 0–99)
Triglycerides: 442 mg/dL — ABNORMAL HIGH (ref 0.0–149.0)
VLDL: 88.4 mg/dL — ABNORMAL HIGH (ref 0.0–40.0)

## 2013-08-31 LAB — HEPATIC FUNCTION PANEL
ALT: 20 U/L (ref 0–53)
AST: 19 U/L (ref 0–37)
Albumin: 3.8 g/dL (ref 3.5–5.2)
Alkaline Phosphatase: 121 U/L — ABNORMAL HIGH (ref 39–117)
BILIRUBIN DIRECT: 0 mg/dL (ref 0.0–0.3)
BILIRUBIN TOTAL: 0.6 mg/dL (ref 0.3–1.2)
Total Protein: 6.7 g/dL (ref 6.0–8.3)

## 2013-08-31 LAB — BASIC METABOLIC PANEL
BUN: 14 mg/dL (ref 6–23)
CALCIUM: 9.1 mg/dL (ref 8.4–10.5)
CHLORIDE: 103 meq/L (ref 96–112)
CO2: 28 meq/L (ref 19–32)
Creatinine, Ser: 1 mg/dL (ref 0.4–1.5)
GFR: 77.4 mL/min (ref >60.00)
Glucose, Bld: 164 mg/dL — ABNORMAL HIGH (ref 70–99)
Potassium: 3.6 mEq/L (ref 3.5–5.1)
SODIUM: 138 meq/L (ref 135–145)

## 2013-08-31 LAB — MICROALBUMIN / CREATININE URINE RATIO
Creatinine,U: 71.7 mg/dL
MICROALB UR: 1.5 mg/dL (ref 0.0–1.9)
Microalb Creat Ratio: 2.1 mg/g (ref 0.0–30.0)

## 2013-08-31 LAB — HEMOGLOBIN A1C: HEMOGLOBIN A1C: 7.7 % — AB (ref 4.6–6.5)

## 2013-08-31 LAB — URIC ACID: Uric Acid, Serum: 5.1 mg/dL (ref 4.0–7.8)

## 2013-09-01 ENCOUNTER — Telehealth: Payer: Self-pay

## 2013-09-01 NOTE — Telephone Encounter (Signed)
Relevant patient education assigned to patient using Emmi. ° °

## 2013-09-02 ENCOUNTER — Telehealth: Payer: Self-pay | Admitting: *Deleted

## 2013-09-02 DIAGNOSIS — E119 Type 2 diabetes mellitus without complications: Secondary | ICD-10-CM

## 2013-09-02 DIAGNOSIS — E1110 Type 2 diabetes mellitus with ketoacidosis without coma: Secondary | ICD-10-CM

## 2013-09-02 NOTE — Telephone Encounter (Signed)
Message copied by Chilton Greathouse on Thu Sep 02, 2013 12:42 PM ------      Message from: Rosalita Chessman      Created: Wed Sep 01, 2013  8:32 AM       DM -- not controlled---increase metformin to 1074m 2x a day #60 2 refills      Cholesterol--- LDL goal < 70,  HDL >40,  TG < 150.  Diet and exercise will increase HDL and decrease LDL and TG.  Fish,  Fish Oil, Flaxseed oil will also help increase the HDL and decrease Triglycerides.   Recheck labs in 3 months----272.4  250.02  Lipid, hep, bmp, hgba1c   Need fractionated alk phos secondary to elevated alk phos.      Add fenofibrate 160 mg #30  1 po qd  2 refills              ------

## 2013-09-06 ENCOUNTER — Other Ambulatory Visit: Payer: Self-pay | Admitting: Internal Medicine

## 2013-09-06 ENCOUNTER — Other Ambulatory Visit: Payer: Self-pay

## 2013-09-06 DIAGNOSIS — R748 Abnormal levels of other serum enzymes: Secondary | ICD-10-CM

## 2013-09-06 DIAGNOSIS — E781 Pure hyperglyceridemia: Secondary | ICD-10-CM

## 2013-09-06 MED ORDER — FENOFIBRATE 160 MG PO TABS: 160.0000 mg | ORAL_TABLET | Freq: Every day | ORAL | Status: DC

## 2013-09-06 MED ORDER — METFORMIN HCL 1000 MG PO TABS: 1000.0000 mg | ORAL_TABLET | Freq: Two times a day (BID) | ORAL | Status: DC

## 2013-09-06 NOTE — Telephone Encounter (Signed)
Spoke with patient and appt made for repeat labs. Also advised patient of recent lab results and changes to medication regime. Rx for fenofibrate and metformin sent to [facility] on [address]

## 2013-09-08 ENCOUNTER — Other Ambulatory Visit: Payer: Medicare Other

## 2013-09-09 ENCOUNTER — Other Ambulatory Visit (INDEPENDENT_AMBULATORY_CARE_PROVIDER_SITE_OTHER): Payer: Medicare Other

## 2013-09-09 DIAGNOSIS — R748 Abnormal levels of other serum enzymes: Secondary | ICD-10-CM

## 2013-09-10 DIAGNOSIS — R748 Abnormal levels of other serum enzymes: Secondary | ICD-10-CM | POA: Diagnosis not present

## 2013-09-17 LAB — ALKALINE PHOSPHATASE ISOENZYMES
Alkaline Phonsphatase: 136 U/L — ABNORMAL HIGH (ref 40–115)
Bone Isoenzymes: 21 % — ABNORMAL LOW (ref 28–66)
Intestinal Isoenzymes: 3 % (ref 1–24)
LIVER ISOENZYMES (ALP ISO): 76 % — AB (ref 25–69)
Macrohepatic isoenzymes: 0 %

## 2013-09-18 ENCOUNTER — Other Ambulatory Visit: Payer: Self-pay | Admitting: Family Medicine

## 2013-09-18 DIAGNOSIS — R748 Abnormal levels of other serum enzymes: Secondary | ICD-10-CM

## 2013-09-23 ENCOUNTER — Other Ambulatory Visit: Payer: Self-pay | Admitting: *Deleted

## 2013-09-23 MED ORDER — GLUCOSE BLOOD VI STRP: ORAL_STRIP | Status: DC

## 2013-09-23 NOTE — Telephone Encounter (Signed)
Rx sent to the pharmacy by e-script.//AB/CMA 

## 2013-09-27 ENCOUNTER — Ambulatory Visit (HOSPITAL_BASED_OUTPATIENT_CLINIC_OR_DEPARTMENT_OTHER)
Admission: RE | Admit: 2013-09-27 | Discharge: 2013-09-27 | Disposition: A | Discharge status: Home / Self Care | Payer: Medicare Other | Source: Ambulatory Visit | Attending: Family Medicine | Admitting: Family Medicine

## 2013-09-27 ENCOUNTER — Encounter: Payer: Self-pay | Admitting: Physician Assistant

## 2013-09-27 ENCOUNTER — Ambulatory Visit (INDEPENDENT_AMBULATORY_CARE_PROVIDER_SITE_OTHER): Payer: Medicare Other | Admitting: Physician Assistant

## 2013-09-27 ENCOUNTER — Other Ambulatory Visit: Payer: Self-pay | Admitting: Family Medicine

## 2013-09-27 VITALS — BP 143/65 | HR 82 | Temp 98.6°F | Resp 18 | Ht 72.0 in | Wt 274.1 lb

## 2013-09-27 DIAGNOSIS — J209 Acute bronchitis, unspecified: Secondary | ICD-10-CM

## 2013-09-27 DIAGNOSIS — R748 Abnormal levels of other serum enzymes: Secondary | ICD-10-CM | POA: Diagnosis not present

## 2013-09-27 DIAGNOSIS — R011 Cardiac murmur, unspecified: Secondary | ICD-10-CM

## 2013-09-27 DIAGNOSIS — R062 Wheezing: Secondary | ICD-10-CM

## 2013-09-27 DIAGNOSIS — Z125 Encounter for screening for malignant neoplasm of prostate: Secondary | ICD-10-CM

## 2013-09-27 LAB — GAMMA GT: GGT: 48 U/L (ref 7–51)

## 2013-09-27 LAB — PSA: PSA: 0.65 ng/mL (ref 0.10–4.00)

## 2013-09-27 MED ORDER — AZITHROMYCIN 250 MG PO TABS: ORAL_TABLET | ORAL | Status: DC

## 2013-09-27 MED ORDER — ALBUTEROL SULFATE HFA 108 (90 BASE) MCG/ACT IN AERS: 2.0000 | INHALATION_SPRAY | Freq: Four times a day (QID) | RESPIRATORY_TRACT | Status: DC | PRN

## 2013-09-27 NOTE — Assessment & Plan Note (Signed)
Rx Azithromycin.  Rx Albuterol inhaler.  Increase fluids.  Rest.  Saline nasal spray.  Humidifier in bedroom.  Continue CPAP.

## 2013-09-27 NOTE — Progress Notes (Signed)
Pre visit review using our clinic review tool, if applicable. No additional management support is needed unless otherwise documented below in the visit note/SLS  

## 2013-09-27 NOTE — Assessment & Plan Note (Signed)
Recent elevated Alk Phos.  Abdominal US unremarkable.  Will repeat Alk Phos and GGT per MD's previous note.

## 2013-09-27 NOTE — Progress Notes (Signed)
Patient presents to clinic today c/o several days of nasal congestion, cough productive of greenish sputum, post-nasal drip and chest wall tenderness.  Denies fever, aches.  Endorses intermittent chills.  Some mild SOB and chest tightness.  Denies chest pain.  Denies recent travel or sick contact. Patient with hx of COPD, not currently on medication.    Patient also wishes to discuss recent labs and abdominal ultrasound results.  Past Medical History  Diagnosis Date  . Gout   . Esophageal stricture     X 4  . Mixed hyperlipidemia   . Peptic stricture of esophagus   . Antritis (stomach)     mild  . Duodenitis     chronic  . Diverticulosis     mild, left colon  . Anemia   . DM (diabetes mellitus)   . HTN (hypertension)   . Sliding hiatal hernia   . Allergy   . Arthritis     bil knees  . GERD (gastroesophageal reflux disease)   . S/P CABG x 5 2001  . CAD (coronary artery disease)     followed by Dr. Verl Blalock  . HNP (herniated nucleus pulposus), lumbar   . Nocturia   . Sleep apnea     uses c pap with 2L O2 at night    Current Outpatient Prescriptions on File Prior to Visit  Medication Sig Dispense Refill  . allopurinol (ZYLOPRIM) 300 MG tablet Take 300 mg by mouth 1 day or 1 dose.       [name] amLODipine (NORVASC) 5 MG tablet Take 5 mg by mouth daily with breakfast.      . Ascorbic Acid (VITAMIN C) 500 MG tablet Take 500 mg by mouth every morning.       [name] aspirin 81 MG tablet Take 81 mg by mouth daily.      [name] atorvastatin (LIPITOR) 80 MG tablet Take 80 mg by mouth daily with breakfast.      . CINNAMON PO Take 2 tablets by mouth daily.      . Coenzyme Q10 (CO Q-10 PO) Take 1 tablet by mouth daily.      . cyanocobalamin ([facility] VITAMIN B12) 1000 MCG tablet Take 1 tablet (1,000 mcg total) by mouth daily.      [name] doxazosin (CARDURA) 2 MG tablet Take 1 tablet (2 mg total) by mouth daily.  30 tablet  0  . esomeprazole (NEXIUM) 40 MG capsule Take 1 capsule (40 mg total) by mouth daily.  30  capsule  5  . ezetimibe (ZETIA) 10 MG tablet Take 10 mg by mouth every morning.      . fenofibrate 160 MG tablet Take 1 tablet (160 mg total) by mouth daily.  30 tablet  3  . fexofenadine (ALLEGRA) 180 MG tablet Take 90 mg by mouth 2 (two) times daily.       . Flaxseed, Linseed, (FLAX SEED OIL) 1300 MG CAPS Take 1 tablet by mouth daily.      . fluticasone (FLONASE) 50 MCG/ACT nasal spray Place 1 spray into both nostrils daily as needed for allergies or rhinitis.      . furosemide (LASIX) 40 MG tablet Take 40 mg by mouth 2 (two) times daily.      [name] glucose blood (FREESTYLE LITE) test strip CHECK BLOOD SUGAR ONCE DAILY. DX:250.62  100 each  12  . Lancets (FREESTYLE) lancets Test blood sugar daily as instructed. Dx: 250.00  100 each  12  . metFORMIN (GLUCOPHAGE) 1000 MG tablet Take 1  tablet (1,000 mg total) by mouth 2 (two) times daily with a meal.  60 tablet  2  . metoprolol succinate (TOPROL-XL) 100 MG 24 hr tablet Take 150 mg by mouth daily with breakfast. Take with or immediately following a meal.      . nitroGLYCERIN (NITROSTAT) 0.4 MG SL tablet Place 0.4 mg under the tongue every 5 (five) minutes as needed for chest pain.      . Omega-3 Fatty Acids (OMEGA 3 PO) Take 1 tablet by mouth daily.      . potassium chloride SA (K-DUR,KLOR-CON) 20 MEQ tablet Take 20 mEq by mouth 2 (two) times daily.      . potassium chloride SA (K-DUR,KLOR-CON) 20 MEQ tablet TAKE ONE TABLET BY MOUTH TWICE DAILY   60 tablet  5  . pregabalin (LYRICA) 75 MG capsule Take 75 mg by mouth 2 (two) times daily.      . psyllium (HYDROCIL/METAMUCIL) 95 % PACK Take 1 packet by mouth 2 (two) times daily.       No current facility-administered medications on file prior to visit.    Allergies  Allergen Reactions  . Benazepril     angioedema; he is not a candidate for any angiotensin receptor blockers because of this significant allergic reaction. Because of a history of documented adverse serious drug reaction;Medi Alert  bracelet  is recommended  . Hctz [Hydrochlorothiazide] Swelling    Tongue and lip swelling   . Aspirin Other (See Comments)    Gastritis, cant take 325 Mg aspirin     Family History  Problem Relation Age of Onset  . Stroke Father   . Hypertension Father   . Pancreatic cancer Mother   . Diabetes Maternal Grandmother   . Stroke Maternal Grandmother   . Heart attack Paternal Grandmother   . Colon cancer Neg Hx   . Esophageal cancer Neg Hx   . Rectal cancer Neg Hx   . Stomach cancer Neg Hx   . Ulcers Neg Hx     History   Social History  . Marital Status: Divorced    Spouse Name: N/A    Number of Children: 2  . Years of Education: N/A   Occupational History  . 2    Social History Main Topics  . Smoking status: Never Smoker   . Smokeless tobacco: Never Used  . Alcohol Use: Yes     Comment: rarely  . Drug Use: No  . Sexual Activity: None   Other Topics Concern  . None   Social History Narrative   Divorced, lives with a roommate.   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   Review of Systems - See HPI.  All other ROS are negative.  BP 143/65  Pulse 82  Temp(Src) 98.6 F (37 C) (Oral)  Resp 18  Ht 6' (1.829 m)  Wt 274 lb 2 oz (124.342 kg)  BMI 37.17 kg/m2  SpO2 95%  Physical Exam  Vitals reviewed. Constitutional: He is oriented to person, place, and time and well-developed, well-nourished, and in no distress.  HENT:  Head: Normocephalic and atraumatic.  Right Ear: External ear normal.  Left Ear: External ear normal.  Nose: Nose normal.  Mouth/Throat: Oropharynx is clear and moist. No oropharyngeal exudate.  TM within normal limits bilaterally.  No TTP of sinuses noted  on examination.  Eyes: Conjunctivae are normal. Pupils are equal, round, and reactive to light.  Neck: Neck  supple.  Cardiovascular: Normal rate, regular rhythm and intact distal pulses.   II/VI SEM heard best at LUSB. No prior Echo on file.  Pulmonary/Chest: Effort normal. No respiratory distress. He has wheezes. He has no rales. He exhibits no tenderness.  Lymphadenopathy:    He has no cervical adenopathy.  Neurological: He is alert and oriented to person, place, and time.  Skin: Skin is warm and dry. No rash noted.  Psychiatric: Affect normal.    Recent Results (from the past 2160 hour(s))  BASIC METABOLIC PANEL     Status: Abnormal   Collection Time    08/30/13  4:15 PM      Result Value Ref Range   Sodium 138  135 - 145 mEq/L   Potassium 3.6  3.5 - 5.1 mEq/L   Chloride 103  96 - 112 mEq/L   CO2 28  19 - 32 mEq/L   Glucose, Bld 164 (*) 70 - 99 mg/dL   BUN 14  6 - 23 mg/dL   Creatinine, Ser 1.0  0.4 - 1.5 mg/dL   Calcium 9.1  8.4 - 10.5 mg/dL   GFR 77.40  >60.00 mL/min  HEMOGLOBIN A1C     Status: Abnormal   Collection Time    08/30/13  4:15 PM      Result Value Ref Range   Hemoglobin A1C 7.7 (*) 4.6 - 6.5 %   Comment: Glycemic Control Guidelines for People with Diabetes:Non Diabetic:  <6%Goal of Therapy: <7%Additional Action Suggested:  >8%   HEPATIC FUNCTION PANEL     Status: Abnormal   Collection Time    08/30/13  4:15 PM      Result Value Ref Range   Total Bilirubin 0.6  0.3 - 1.2 mg/dL   Bilirubin, Direct 0.0  0.0 - 0.3 mg/dL   Alkaline Phosphatase 121 (*) 39 - 117 U/L   AST 19  0 - 37 U/L   ALT 20  0 - 53 U/L   Total Protein 6.7  6.0 - 8.3 g/dL   Albumin 3.8  3.5 - 5.2 g/dL  LIPID PANEL     Status: Abnormal   Collection Time    08/30/13  4:15 PM      Result Value Ref Range   Cholesterol 149  0 - 200 mg/dL   Comment: ATP III Classification       Desirable:  < 200 mg/dL               Borderline High:  200 - 239 mg/dL          High:  > = 240 mg/dL   Triglycerides 442.0 (*) 0.0 - 149.0 mg/dL   Comment: Normal:  <150 mg/dLBorderline High:  150 - 199 mg/dL   HDL 31.10  (*) >39.00 mg/dL   VLDL 88.4 (*) 0.0 - 40.0 mg/dL   LDL Cholesterol 30  0 - 99 mg/dL   Total CHOL/HDL Ratio 5     Comment:                Men          Women1/2 Average Risk     3.4          3.3Average Risk          5.0          4.42X Average Risk  9.6          7.13X Average Risk          15.0          11.0                      MICROALBUMIN / CREATININE URINE RATIO     Status: None   Collection Time    08/30/13  4:15 PM      Result Value Ref Range   Microalb, Ur 1.5  0.0 - 1.9 mg/dL   Creatinine,U 71.7     Microalb Creat Ratio 2.1  0.0 - 30.0 mg/g  URIC ACID     Status: None   Collection Time    08/30/13  4:15 PM      Result Value Ref Range   Uric Acid, Serum 5.1  4.0 - 7.8 mg/dL  POCT URINALYSIS DIPSTICK     Status: None   Collection Time    08/30/13  4:43 PM      Result Value Ref Range   Color, UA yellow     Clarity, UA clear     Glucose, UA Neg     Bilirubin, UA Neg     Ketones, UA Neg     Spec Grav, UA 1.015     Blood, UA Neg     pH, UA 6.0     Protein, UA Neg     Urobilinogen, UA 0.2     Nitrite, UA Neg     Leukocytes, UA Negative    ALKALINE PHOSPHATASE ISOENZYMES     Status: Abnormal   Collection Time    09/10/13  8:30 AM      Result Value Ref Range   Alkaline Phonsphatase 136 (*) 40 - 115 U/L   Intestinal Isoenzymes 3  1 - 24 %   Bone Isoenzymes 21 (*) 28 - 66 %   Liver Isoenzymes 76 (*) 25 - 69 %   Macrohepatic isoenzymes 0  0 %   Comment:       Macrohepatic ALP(1) has been isolated in cases of     metastatic carcinoma to the liver and has been suggested     as a diagnostic tool in identifying such cases. It has     also been isolated in patients with viral hepatitis,     alcoholic cirrhosis and other liver diseases. Data     suggests that macrohepatic ALP is highly correlated     with the presence of liver metastases and that the     presence of this isoenzyme could be predictive of the     appearance of liver metastases. Macrohepatic ALP is seen      occasionally in patients free of any disease state.           1. Viot, M. et al., Biomedicine, 31:74-77, 1979.         Assessment/Plan: Acute bronchitis Rx Azithromycin.  Rx Albuterol inhaler.  Increase fluids.  Rest.  Saline nasal spray.  Humidifier in bedroom.  Continue CPAP.  Elevated alkaline phosphatase level Recent elevated Alk Phos.  Abdominal US unremarkable.  Will repeat Alk Phos and GGT per MD's previous note.  Undiagnosed cardiac murmurs Patient with hx of HTN and bypass surgery.  No prior Echo on file.  Followed by Cardiology.  Physical exam negative for LE edema or JVD.  Will obtain Echo.

## 2013-09-27 NOTE — Assessment & Plan Note (Signed)
Patient with hx of HTN and bypass surgery.  No prior Echo on file.  Followed by Cardiology.  Physical exam negative for LE edema or JVD.  Will obtain Echo.

## 2013-09-27 NOTE — Patient Instructions (Signed)
Please take antibiotic as prescribed.  Use Albuterol every 4-6 hours as needed for wheezing and chest tightness.  Increase fluid intake.  Rest.  Use plain Mucinex.  Put a humidifier in the bedroom.  Call or return to clinic if symptoms are not improving.  Please go to lab for labwork that Dr. Etter Sjogren has ordered.  She will call you with your results.  You will be contacted by Cardiology for an echocardiogram.  Follow-up with Cardiologist as scheduled.  Metered Dose Inhaler (No Spacer Used) Inhaled medicines are the basis of asthma treatment and other breathing problems. Inhaled medicine can only be effective if used properly. Good technique assures that the medicine reaches the lungs. Metered dose inhalers (MDIs) are used to deliver a variety of inhaled medicines. These include quick relief or rescue medicines (such as bronchodilators) and controller medicines (such as corticosteroids). The medicine is delivered by pushing down on a metal canister to release a set amount of spray. If you are using different kinds of inhalers, use your quick relief medicine to open the airways 10 15 minutes before using a steroid if instructed to do so by your health care provider. If you are unsure which inhalers to use and the order of using them, ask your health care provider, nurse, or respiratory therapist. HOW TO USE THE INHALER 1. Remove cap from inhaler. 2. If you are using the inhaler for the first time, you will need to prime it. Shake the inhaler for 5 seconds and release four puffs into the air, away from your face. Ask your health care provider or pharmacist if you have questions about priming your inhaler. 3. Shake inhaler for 5 seconds before each breath in (inhalation). 4. Position the inhaler so that the top of the canister faces up. 5. Put your index finger on the top of the medicine canister. Your thumb supports the bottom of the inhaler. 6. Open your mouth. 7. Either place the inhaler between your teeth  and place your lips tightly around the mouthpiece, or hold the inhaler 1 2 inches away from your open mouth. If you are unsure of which technique to use, ask your health care provider. 8. Breathe out (exhale) normally and as completely as possible. 9. Press the canister down with the index finger to release the medicine. 10. At the same time as the canister is pressed, inhale deeply and slowly until the lungs are completely filled. This should take 4 6 seconds. Keep your tongue down. 11. Hold the medicine in your lungs for up to 5 10 seconds (10 seconds is best). This helps the medicine get into the small airways of your lungs. 12. Breathe out slowly, through pursed lips. Whistling is an example of pursed lips. 13. Wait at least 1 minute between puffs. Continue with the above steps until you have taken the number of puffs your health care provider has ordered. Do not use the inhaler more than your health care provider directs you to. 14. Replace cap on inhaler. 15. Follow the directions from your health care provider or the inhaler insert for cleaning the inhaler. If you are using a steroid inhaler, rinse your mouth with water after your last puff, gargle, and spit out the water. Do not swallow the water. AVOID:  Inhaling before or after starting the spray of medicine. It takes practice to coordinate your breathing with triggering the spray.  Inhaling through the nose (rather than the mouth) when triggering the spray. HOW TO DETERMINE IF YOUR INHALER  IS FULL OR NEARLY EMPTY You cannot know when an inhaler is empty by shaking it. A few inhalers are now being made with dose counters. Ask your health care provider for a prescription that has a dose counter if you feel you need that extra help. If your inhaler does not have a counter, ask your health care provider to help you determine the date you need to refill your inhaler. Write the refill date on a calendar or your inhaler canister. Refill your  inhaler 7 10 days before it runs out. Be sure to keep an adequate supply of medicine. This includes making sure it is not expired, and you have a spare inhaler.  SEEK MEDICAL CARE IF:   Symptoms are only partially relieved with your inhaler.  You are having trouble using your inhaler.  You experience some increase in phlegm. SEEK IMMEDIATE MEDICAL CARE IF:   You feel little or no relief with your inhalers. You are still wheezing and are feeling shortness of breath or tightness in your chest or both.  You have dizziness, headaches, or fast heart rate.  You have chills, fever, or night sweats.  There is a noticeable increase in phlegm production, or there is blood in the phlegm. Document Released: 04/14/2007 Document Revised: 02/17/2013 Document Reviewed: 12/03/2012 [facility] Patient Information 2014 [place], [place].

## 2013-09-28 DIAGNOSIS — M5126 Other intervertebral disc displacement, lumbar region: Secondary | ICD-10-CM | POA: Diagnosis not present

## 2013-09-28 DIAGNOSIS — Z4889 Encounter for other specified surgical aftercare: Secondary | ICD-10-CM | POA: Diagnosis not present

## 2013-10-12 ENCOUNTER — Other Ambulatory Visit (HOSPITAL_COMMUNITY): Payer: Self-pay | Admitting: Radiology

## 2013-10-12 ENCOUNTER — Ambulatory Visit (HOSPITAL_COMMUNITY): Payer: Medicare Other | Attending: Physician Assistant | Admitting: Radiology

## 2013-10-12 DIAGNOSIS — R011 Cardiac murmur, unspecified: Secondary | ICD-10-CM

## 2013-10-12 DIAGNOSIS — I2510 Atherosclerotic heart disease of native coronary artery without angina pectoris: Secondary | ICD-10-CM

## 2013-10-12 NOTE — Progress Notes (Signed)
Echocardiogram Performed. 

## 2013-10-23 ENCOUNTER — Other Ambulatory Visit: Payer: Self-pay | Admitting: Cardiology

## 2013-11-02 ENCOUNTER — Other Ambulatory Visit: Payer: Self-pay | Admitting: Cardiology

## 2013-11-24 ENCOUNTER — Other Ambulatory Visit: Payer: Self-pay | Admitting: Cardiology

## 2013-11-29 ENCOUNTER — Other Ambulatory Visit: Payer: Self-pay | Admitting: Cardiology

## 2013-12-16 ENCOUNTER — Telehealth: Payer: Self-pay | Admitting: Family Medicine

## 2013-12-16 DIAGNOSIS — E1165 Type 2 diabetes mellitus with hyperglycemia: Principal | ICD-10-CM

## 2013-12-16 DIAGNOSIS — IMO0001 Reserved for inherently not codable concepts without codable children: Secondary | ICD-10-CM

## 2013-12-16 DIAGNOSIS — E785 Hyperlipidemia, unspecified: Secondary | ICD-10-CM

## 2013-12-16 NOTE — Telephone Encounter (Signed)
Caller name: Sonny  Call back [phone]   Reason for call:  Pt states he is supposed to get his blood sugar checked at the lab every 3 months.  Can we get these orders placed so we can schedule?

## 2013-12-16 NOTE — Telephone Encounter (Signed)
Lab apt scheduled for 6/22 @ 8:15

## 2013-12-16 NOTE — Telephone Encounter (Signed)
272.4 250.02 Lipid, hep, bmp, hgba1c. Orders are in please schedule.     KP

## 2013-12-20 ENCOUNTER — Other Ambulatory Visit (INDEPENDENT_AMBULATORY_CARE_PROVIDER_SITE_OTHER): Payer: Medicare Other

## 2013-12-20 DIAGNOSIS — IMO0001 Reserved for inherently not codable concepts without codable children: Secondary | ICD-10-CM

## 2013-12-20 DIAGNOSIS — E1165 Type 2 diabetes mellitus with hyperglycemia: Secondary | ICD-10-CM

## 2013-12-20 DIAGNOSIS — E785 Hyperlipidemia, unspecified: Secondary | ICD-10-CM

## 2013-12-20 LAB — BASIC METABOLIC PANEL
BUN: 16 mg/dL (ref 6–23)
CO2: 25 mEq/L (ref 19–32)
Calcium: 9.6 mg/dL (ref 8.4–10.5)
Chloride: 104 mEq/L (ref 96–112)
Creatinine, Ser: 1.1 mg/dL (ref 0.4–1.5)
GFR: 67.86 mL/min (ref >60.00)
GLUCOSE: 121 mg/dL — AB (ref 70–99)
POTASSIUM: 4.2 meq/L (ref 3.5–5.1)
SODIUM: 140 meq/L (ref 135–145)

## 2013-12-20 LAB — HEPATIC FUNCTION PANEL
ALT: 20 U/L (ref 0–53)
AST: 26 U/L (ref 0–37)
Albumin: 4.1 g/dL (ref 3.5–5.2)
Alkaline Phosphatase: 75 U/L (ref 39–117)
BILIRUBIN DIRECT: 0 mg/dL (ref 0.0–0.3)
Total Bilirubin: 0.5 mg/dL (ref 0.2–1.2)
Total Protein: 7.2 g/dL (ref 6.0–8.3)

## 2013-12-20 LAB — LIPID PANEL
Cholesterol: 153 mg/dL (ref 0–200)
HDL: 32 mg/dL — ABNORMAL LOW (ref >39.00)
LDL Cholesterol: 52 mg/dL (ref 0–99)
NONHDL: 121
Total CHOL/HDL Ratio: 5
Triglycerides: 347 mg/dL — ABNORMAL HIGH (ref 0.0–149.0)
VLDL: 69.4 mg/dL — AB (ref 0.0–40.0)

## 2013-12-20 LAB — HEMOGLOBIN A1C: Hgb A1c MFr Bld: 7.5 % — ABNORMAL HIGH (ref 4.6–6.5)

## 2013-12-22 ENCOUNTER — Telehealth: Payer: Self-pay

## 2013-12-22 MED ORDER — GLIMEPIRIDE 2 MG PO TABS: 2.0000 mg | ORAL_TABLET | Freq: Every day | ORAL | Status: DC

## 2013-12-22 NOTE — Telephone Encounter (Signed)
Discussed with patient and he voiced understanding, He has been agreed to start Amaryl and cont' med's for cholesterol. Rx has been faxed, no copy mailed due to results in my-chart.     KP

## 2013-12-22 NOTE — Telephone Encounter (Signed)
Message copied by Ewing Schlein on Wed Dec 22, 2013  4:28 PM ------      Message from: Rosalita Chessman      Created: Mon Dec 20, 2013  9:44 PM       TG better but stiill high-- con't med ---lipitor and fenofibrate and zetia      DM still not at goal--  Add amaryl 2 mg 1 po qd #30  1 po qd, 2 refills      Recheck 3 months--  272.4  250.01  Lipid, hep, bmp, hgba1c       ------

## 2013-12-25 ENCOUNTER — Other Ambulatory Visit: Payer: Self-pay | Admitting: Family Medicine

## 2013-12-27 ENCOUNTER — Other Ambulatory Visit: Payer: Self-pay | Admitting: Family Medicine

## 2013-12-30 DIAGNOSIS — L578 Other skin changes due to chronic exposure to nonionizing radiation: Secondary | ICD-10-CM | POA: Diagnosis not present

## 2013-12-30 DIAGNOSIS — Z85828 Personal history of other malignant neoplasm of skin: Secondary | ICD-10-CM | POA: Diagnosis not present

## 2013-12-30 DIAGNOSIS — L820 Inflamed seborrheic keratosis: Secondary | ICD-10-CM | POA: Diagnosis not present

## 2013-12-30 DIAGNOSIS — L570 Actinic keratosis: Secondary | ICD-10-CM | POA: Diagnosis not present

## 2014-01-12 DIAGNOSIS — M1710 Unilateral primary osteoarthritis, unspecified knee: Secondary | ICD-10-CM | POA: Diagnosis not present

## 2014-01-28 ENCOUNTER — Encounter: Payer: Self-pay | Admitting: Gastroenterology

## 2014-02-02 ENCOUNTER — Other Ambulatory Visit: Payer: Self-pay | Admitting: Internal Medicine

## 2014-02-03 ENCOUNTER — Other Ambulatory Visit: Payer: Self-pay

## 2014-02-03 MED ORDER — FLUTICASONE PROPIONATE 50 MCG/ACT NA SUSP: 1.0000 | Freq: Every day | NASAL | Status: DC | PRN

## 2014-02-08 ENCOUNTER — Telehealth: Payer: Self-pay | Admitting: Family Medicine

## 2014-02-08 ENCOUNTER — Telehealth: Payer: Self-pay

## 2014-02-08 NOTE — Telephone Encounter (Signed)
Spoke with patient and he said he BS have dropping since being on Amaryl. He said last night he ate dinner and drank a soda prior to going to bed at 8:30 but he woke up at 12:30 and his blood sugar was at 85.  He said he felt weak, he drank another soft drink, and took a glucose tab and got his sugar up to 121. He is afraid his BS will drop while he is asleep, he said this has happened four times already.  Please advise      KP

## 2014-02-08 NOTE — Telephone Encounter (Signed)
Spoke with patient and I made him aware to cut the pill in half or I can call it in, he said he is willing to cut it in half, He said he will continue to keep up with Blood sugars and call if any concerns or if it drops lower.     KP

## 2014-02-08 NOTE — Telephone Encounter (Signed)
Cut pill in half--  Or we can call in 1 mg

## 2014-02-08 NOTE — Telephone Encounter (Signed)
Caller name: Merritt Relation to pt: Call back number:[phone] Pharmacy:  Reason for call: [facility] called and said that the new medicine he is on for his diabetes is causing his sugars to drop, and making him weak. Please call him back to discuss

## 2014-02-11 ENCOUNTER — Telehealth: Payer: Self-pay

## 2014-02-11 MED ORDER — GLUCOSE BLOOD VI STRP: ORAL_STRIP | Status: DC

## 2014-02-11 MED ORDER — ONETOUCH DELICA LANCETS FINE MISC: Status: DC

## 2014-02-11 NOTE — Telephone Encounter (Signed)
Rx sent      KP 

## 2014-02-11 NOTE — Telephone Encounter (Signed)
Caller name:Izzy Relation to pt: Call back [place]  Reason for call: Richard [name] called and said he needs [place] to send in a new prescription for lancets and strips for his new machine you gave him on Monday (One Touch)

## 2014-02-12 DIAGNOSIS — Z23 Encounter for immunization: Secondary | ICD-10-CM | POA: Diagnosis not present

## 2014-02-16 ENCOUNTER — Encounter: Payer: Self-pay | Admitting: Internal Medicine

## 2014-02-16 ENCOUNTER — Ambulatory Visit (INDEPENDENT_AMBULATORY_CARE_PROVIDER_SITE_OTHER): Payer: Medicare Other | Admitting: Internal Medicine

## 2014-02-16 VITALS — BP 146/68 | HR 64 | Ht 72.0 in | Wt 277.4 lb

## 2014-02-16 DIAGNOSIS — R195 Other fecal abnormalities: Secondary | ICD-10-CM | POA: Diagnosis not present

## 2014-02-16 NOTE — Progress Notes (Signed)
   Subjective:    Patient ID: Richard [name], male    DOB: [birth year], 73 y.o.   MRN: [id number]  HPI Here because change in bowels. Loose stool - second one of day. Rarely has another. First stool is normal. The loose stool interfering with golf game - plays every AM. Last Abx 08/2013 - Z pak Eating a lot of vegetables, tomatoes since summer started. No abdominal pain except burning epigastric pain if goes too long w/o eating.  Medications, allergies, past medical history, past surgical history, family history and social history are reviewed and updated in the EMR. Review of Systems As above - also had glimeperide reduced given low BS    Objective:   Physical Exam Obese Soft and nontender    Assessment & Plan:  Loose stools  Could be from diet - will reduce vegetables and tomatoes Could be metformin but no change in dose ? From glimeperide Does not sound infectious with NL stool in AM (first of day)  He will reduce vegetables in diet and call back with update. Reassured Next colonoscopy for polyp f/u 2016 (had polyps 2013)

## 2014-02-16 NOTE — Patient Instructions (Signed)
Please try and get off the diet soda.     Also try and cut back on how many tomato's your eating.   I appreciate the opportunity to care for you.

## 2014-02-21 ENCOUNTER — Encounter: Payer: Self-pay | Admitting: General Practice

## 2014-02-21 DIAGNOSIS — M1710 Unilateral primary osteoarthritis, unspecified knee: Secondary | ICD-10-CM | POA: Diagnosis not present

## 2014-02-28 DIAGNOSIS — M1710 Unilateral primary osteoarthritis, unspecified knee: Secondary | ICD-10-CM | POA: Diagnosis not present

## 2014-03-11 DIAGNOSIS — M1710 Unilateral primary osteoarthritis, unspecified knee: Secondary | ICD-10-CM | POA: Diagnosis not present

## 2014-03-14 ENCOUNTER — Telehealth: Payer: Self-pay | Admitting: *Deleted

## 2014-03-14 NOTE — Telephone Encounter (Signed)
VM left for pt in regards to DB f/u for a BP check  

## 2014-03-15 ENCOUNTER — Observation Stay (HOSPITAL_COMMUNITY)
Admission: EM | Admit: 2014-03-15 | Discharge: 2014-03-17 | Disposition: A | Discharge status: Home / Self Care | Payer: Medicare Other | Attending: Internal Medicine | Admitting: Internal Medicine

## 2014-03-15 ENCOUNTER — Encounter (HOSPITAL_COMMUNITY): Payer: Self-pay | Admitting: Emergency Medicine

## 2014-03-15 ENCOUNTER — Emergency Department (HOSPITAL_COMMUNITY): Payer: Medicare Other

## 2014-03-15 DIAGNOSIS — N182 Chronic kidney disease, stage 2 (mild): Secondary | ICD-10-CM | POA: Diagnosis not present

## 2014-03-15 DIAGNOSIS — I200 Unstable angina: Secondary | ICD-10-CM

## 2014-03-15 DIAGNOSIS — G4733 Obstructive sleep apnea (adult) (pediatric): Secondary | ICD-10-CM | POA: Diagnosis not present

## 2014-03-15 DIAGNOSIS — E785 Hyperlipidemia, unspecified: Secondary | ICD-10-CM | POA: Diagnosis present

## 2014-03-15 DIAGNOSIS — E669 Obesity, unspecified: Secondary | ICD-10-CM | POA: Diagnosis not present

## 2014-03-15 DIAGNOSIS — I129 Hypertensive chronic kidney disease with stage 1 through stage 4 chronic kidney disease, or unspecified chronic kidney disease: Secondary | ICD-10-CM | POA: Diagnosis not present

## 2014-03-15 DIAGNOSIS — I25810 Atherosclerosis of coronary artery bypass graft(s) without angina pectoris: Secondary | ICD-10-CM | POA: Diagnosis not present

## 2014-03-15 DIAGNOSIS — I2510 Atherosclerotic heart disease of native coronary artery without angina pectoris: Secondary | ICD-10-CM

## 2014-03-15 DIAGNOSIS — E782 Mixed hyperlipidemia: Secondary | ICD-10-CM | POA: Insufficient documentation

## 2014-03-15 DIAGNOSIS — I441 Atrioventricular block, second degree: Secondary | ICD-10-CM | POA: Diagnosis not present

## 2014-03-15 DIAGNOSIS — M109 Gout, unspecified: Secondary | ICD-10-CM | POA: Insufficient documentation

## 2014-03-15 DIAGNOSIS — Z79899 Other long term (current) drug therapy: Secondary | ICD-10-CM | POA: Diagnosis not present

## 2014-03-15 DIAGNOSIS — R0609 Other forms of dyspnea: Secondary | ICD-10-CM | POA: Diagnosis not present

## 2014-03-15 DIAGNOSIS — I10 Essential (primary) hypertension: Secondary | ICD-10-CM | POA: Diagnosis not present

## 2014-03-15 DIAGNOSIS — Z7982 Long term (current) use of aspirin: Secondary | ICD-10-CM | POA: Diagnosis not present

## 2014-03-15 DIAGNOSIS — E1165 Type 2 diabetes mellitus with hyperglycemia: Secondary | ICD-10-CM

## 2014-03-15 DIAGNOSIS — R0989 Other specified symptoms and signs involving the circulatory and respiratory systems: Secondary | ICD-10-CM | POA: Diagnosis not present

## 2014-03-15 DIAGNOSIS — R42 Dizziness and giddiness: Secondary | ICD-10-CM | POA: Diagnosis not present

## 2014-03-15 DIAGNOSIS — Z6835 Body mass index (BMI) 35.0-35.9, adult: Secondary | ICD-10-CM | POA: Diagnosis not present

## 2014-03-15 DIAGNOSIS — D509 Iron deficiency anemia, unspecified: Secondary | ICD-10-CM | POA: Diagnosis present

## 2014-03-15 DIAGNOSIS — E1142 Type 2 diabetes mellitus with diabetic polyneuropathy: Secondary | ICD-10-CM | POA: Diagnosis not present

## 2014-03-15 DIAGNOSIS — K219 Gastro-esophageal reflux disease without esophagitis: Secondary | ICD-10-CM | POA: Diagnosis present

## 2014-03-15 DIAGNOSIS — E1151 Type 2 diabetes mellitus with diabetic peripheral angiopathy without gangrene: Secondary | ICD-10-CM | POA: Diagnosis present

## 2014-03-15 DIAGNOSIS — E1149 Type 2 diabetes mellitus with other diabetic neurological complication: Secondary | ICD-10-CM | POA: Diagnosis not present

## 2014-03-15 DIAGNOSIS — R0602 Shortness of breath: Secondary | ICD-10-CM | POA: Diagnosis not present

## 2014-03-15 DIAGNOSIS — E118 Type 2 diabetes mellitus with unspecified complications: Secondary | ICD-10-CM | POA: Diagnosis present

## 2014-03-15 HISTORY — DX: Low back pain: M54.5

## 2014-03-15 HISTORY — DX: Personal history of other diseases of the musculoskeletal system and connective tissue: Z87.39

## 2014-03-15 HISTORY — DX: Other chronic pain: G89.29

## 2014-03-15 HISTORY — DX: Type 2 diabetes mellitus without complications: E11.9

## 2014-03-15 HISTORY — DX: Obstructive sleep apnea (adult) (pediatric): G47.33

## 2014-03-15 HISTORY — DX: Dependence on other enabling machines and devices: Z99.89

## 2014-03-15 HISTORY — DX: Low back pain, unspecified: M54.50

## 2014-03-15 LAB — CBG MONITORING, ED: Glucose-Capillary: 78 mg/dL (ref 70–99)

## 2014-03-15 LAB — CBC
HEMATOCRIT: 39.5 % (ref 39.0–52.0)
HEMOGLOBIN: 13.1 g/dL (ref 13.0–17.0)
MCH: 27.6 pg (ref 26.0–34.0)
MCHC: 33.2 g/dL (ref 30.0–36.0)
MCV: 83.2 fL (ref 78.0–100.0)
Platelets: 261 10*3/uL (ref 150–400)
RBC: 4.75 MIL/uL (ref 4.22–5.81)
RDW: 16.3 % — AB (ref 11.5–15.5)
WBC: 7.5 10*3/uL (ref 4.0–10.5)

## 2014-03-15 LAB — I-STAT TROPONIN, ED: TROPONIN I, POC: 0 ng/mL (ref 0.00–0.08)

## 2014-03-15 LAB — PRO B NATRIURETIC PEPTIDE: Pro B Natriuretic peptide (BNP): 65.5 pg/mL (ref 0–450)

## 2014-03-15 LAB — BASIC METABOLIC PANEL
Anion gap: 15 (ref 5–15)
BUN: 20 mg/dL (ref 6–23)
CALCIUM: 9.4 mg/dL (ref 8.4–10.5)
CO2: 23 mEq/L (ref 19–32)
CREATININE: 1.09 mg/dL (ref 0.50–1.35)
Chloride: 102 mEq/L (ref 96–112)
GFR calc Af Amer: 75 mL/min — ABNORMAL LOW (ref >90)
GFR, EST NON AFRICAN AMERICAN: 64 mL/min — AB (ref >90)
GLUCOSE: 101 mg/dL — AB (ref 70–99)
POTASSIUM: 4 meq/L (ref 3.7–5.3)
Sodium: 140 mEq/L (ref 137–147)

## 2014-03-15 LAB — TROPONIN I: Troponin I: <0.3 ng/mL (ref <0.30)

## 2014-03-15 LAB — GLUCOSE, CAPILLARY: Glucose-Capillary: 140 mg/dL — ABNORMAL HIGH (ref 70–99)

## 2014-03-15 MED ORDER — METOPROLOL SUCCINATE ER 50 MG PO TB24
150.0000 mg | ORAL_TABLET | Freq: Every day | ORAL | Status: DC
  Administered 2014-03-16: 150 mg via ORAL
  Filled 2014-03-15 (×2): qty 1

## 2014-03-15 MED ORDER — PANTOPRAZOLE SODIUM 40 MG PO TBEC
80.0000 mg | DELAYED_RELEASE_TABLET | Freq: Every day | ORAL | Status: DC
  Administered 2014-03-16 – 2014-03-17 (×2): 80 mg via ORAL
  Filled 2014-03-15 (×4): qty 2

## 2014-03-15 MED ORDER — ALBUTEROL SULFATE HFA 108 (90 BASE) MCG/ACT IN AERS: 2.0000 | INHALATION_SPRAY | Freq: Four times a day (QID) | RESPIRATORY_TRACT | Status: DC | PRN

## 2014-03-15 MED ORDER — ONDANSETRON HCL 4 MG/2ML IJ SOLN: 4.0000 mg | Freq: Four times a day (QID) | INTRAMUSCULAR | Status: DC | PRN

## 2014-03-15 MED ORDER — ALLOPURINOL 300 MG PO TABS
450.0000 mg | ORAL_TABLET | Freq: Every day | ORAL | Status: DC
  Administered 2014-03-16 – 2014-03-17 (×2): 450 mg via ORAL
  Filled 2014-03-15 (×2): qty 1

## 2014-03-15 MED ORDER — FENOFIBRATE 54 MG PO TABS
54.0000 mg | ORAL_TABLET | Freq: Every day | ORAL | Status: DC
  Administered 2014-03-16 – 2014-03-17 (×2): 54 mg via ORAL
  Filled 2014-03-15 (×2): qty 1

## 2014-03-15 MED ORDER — ASPIRIN EC 81 MG PO TBEC
81.0000 mg | DELAYED_RELEASE_TABLET | Freq: Every day | ORAL | Status: DC
  Administered 2014-03-17: 81 mg via ORAL
  Filled 2014-03-15: qty 1

## 2014-03-15 MED ORDER — HEPARIN SODIUM (PORCINE) 5000 UNIT/ML IJ SOLN
5000.0000 [IU] | Freq: Three times a day (TID) | INTRAMUSCULAR | Status: DC
  Administered 2014-03-15 – 2014-03-16 (×2): 5000 [IU] via SUBCUTANEOUS
  Filled 2014-03-15 (×5): qty 1

## 2014-03-15 MED ORDER — EZETIMIBE 10 MG PO TABS
10.0000 mg | ORAL_TABLET | Freq: Every day | ORAL | Status: DC
  Administered 2014-03-16 – 2014-03-17 (×2): 10 mg via ORAL
  Filled 2014-03-15 (×2): qty 1

## 2014-03-15 MED ORDER — ACETAMINOPHEN 325 MG PO TABS: 650.0000 mg | ORAL_TABLET | ORAL | Status: DC | PRN

## 2014-03-15 MED ORDER — POTASSIUM CHLORIDE CRYS ER 20 MEQ PO TBCR
20.0000 meq | EXTENDED_RELEASE_TABLET | Freq: Two times a day (BID) | ORAL | Status: DC
  Administered 2014-03-15 – 2014-03-17 (×4): 20 meq via ORAL
  Filled 2014-03-15 (×5): qty 1

## 2014-03-15 MED ORDER — ASPIRIN EC 81 MG PO TBEC: 81.0000 mg | DELAYED_RELEASE_TABLET | Freq: Every day | ORAL | Status: DC

## 2014-03-15 MED ORDER — GLIMEPIRIDE 1 MG PO TABS
1.0000 mg | ORAL_TABLET | Freq: Every day | ORAL | Status: DC
Start: 2014-03-16 — End: 2014-03-17
  Administered 2014-03-16 – 2014-03-17 (×2): 1 mg via ORAL
  Filled 2014-03-15 (×3): qty 1

## 2014-03-15 MED ORDER — ASPIRIN 81 MG PO CHEW
81.0000 mg | CHEWABLE_TABLET | ORAL | Status: AC
Start: 2014-03-16 — End: 2014-03-16
  Administered 2014-03-16: 81 mg via ORAL
  Filled 2014-03-15: qty 1

## 2014-03-15 MED ORDER — FUROSEMIDE 40 MG PO TABS
40.0000 mg | ORAL_TABLET | Freq: Every day | ORAL | Status: DC
  Administered 2014-03-17: 40 mg via ORAL
  Filled 2014-03-15 (×2): qty 1

## 2014-03-15 MED ORDER — ALBUTEROL SULFATE (2.5 MG/3ML) 0.083% IN NEBU: 2.5000 mg | INHALATION_SOLUTION | Freq: Four times a day (QID) | RESPIRATORY_TRACT | Status: DC | PRN

## 2014-03-15 MED ORDER — ASPIRIN 81 MG PO TABS
81.0000 mg | ORAL_TABLET | Freq: Every day | ORAL | Status: DC
Start: 2014-03-15 — End: 2014-03-15

## 2014-03-15 MED ORDER — NITROGLYCERIN 0.4 MG SL SUBL: 0.4000 mg | SUBLINGUAL_TABLET | SUBLINGUAL | Status: DC | PRN

## 2014-03-15 MED ORDER — ATORVASTATIN CALCIUM 80 MG PO TABS
80.0000 mg | ORAL_TABLET | Freq: Every day | ORAL | Status: DC
Start: 2014-03-16 — End: 2014-03-17
  Administered 2014-03-16: 80 mg via ORAL
  Filled 2014-03-15 (×2): qty 1

## 2014-03-15 MED ORDER — AMLODIPINE BESYLATE 5 MG PO TABS
5.0000 mg | ORAL_TABLET | Freq: Every day | ORAL | Status: DC
  Administered 2014-03-16 – 2014-03-17 (×2): 5 mg via ORAL
  Filled 2014-03-15 (×2): qty 1

## 2014-03-15 MED ORDER — SODIUM CHLORIDE 0.9 % IV SOLN
INTRAVENOUS | Status: DC
Start: 2014-03-16 — End: 2014-03-16
  Administered 2014-03-16: 05:00:00 via INTRAVENOUS

## 2014-03-15 MED ORDER — PREGABALIN 25 MG PO CAPS
75.0000 mg | ORAL_CAPSULE | Freq: Two times a day (BID) | ORAL | Status: DC
  Administered 2014-03-15 – 2014-03-17 (×4): 75 mg via ORAL
  Filled 2014-03-15 (×4): qty 3

## 2014-03-15 MED ORDER — SODIUM CHLORIDE 0.9 % IJ SOLN: 3.0000 mL | INTRAMUSCULAR | Status: DC | PRN

## 2014-03-15 MED ORDER — SODIUM CHLORIDE 0.9 % IV SOLN: 250.0000 mL | INTRAVENOUS | Status: DC | PRN

## 2014-03-15 MED ORDER — SODIUM CHLORIDE 0.9 % IJ SOLN
3.0000 mL | Freq: Two times a day (BID) | INTRAMUSCULAR | Status: DC
  Administered 2014-03-15 – 2014-03-16 (×2): 3 mL via INTRAVENOUS

## 2014-03-15 NOTE — Progress Notes (Signed)
Pt. Was placed on CPAP auto titrate (min: 10, max: 15) via nasal mask with 2L O2 bled in. Pt. Is tolerating CPAP well at this time without any complications.

## 2014-03-15 NOTE — ED Provider Notes (Signed)
  Richard [name] is a 73 y.o. male with Hx of diabetes, hypertension, GERD, coronary artery disease, CABG x5 in 2001, chronic kidney disease, anemia presents with SOB beginning at 10am playing golf. Patient with dyspnea on exertion several times throughout the day. Pt cardiologist is Dr. Verl Blalock.  Pt reports ssx are the same as his previous cardiac events.  .    Physical Exam  BP 122/62  Pulse 59  Temp(Src) 97.9 F (36.6 C) (Oral)  Resp 22  Ht 6' (1.829 m)  Wt 260 lb (117.935 kg)  BMI 35.25 kg/m2  SpO2 94%  Physical Exam  Face to face Exam:   General: Awake  HEENT: Atraumatic  Resp: Normal effort  Chest/Pulm: RRR Abd: Nondistended, soft and nontender Neuro:No focal weakness  Lymph: No adenopathy    ED Course  Procedures  MDM Care assumed from [place], PA-C.  Plan: Cardiology consult for SOB and Hx of CABG.  Dispo pending consult  5:20 PM Cardiology to admit with plan for coronary cath tomorrow.  Pt resting comfortably at this time without chest pain.  BP 154/51  Pulse 64  Temp(Src) 97.9 F (36.6 C) (Oral)  Resp 12  Ht 6' (1.829 m)  Wt 260 lb (117.935 kg)  BMI 35.25 kg/m2  SpO2 96%   Abigail Butts, PA-C 03/15/14 1723

## 2014-03-15 NOTE — ED Notes (Signed)
Pt c/o sob that started while he was golfing, sts when he sat down he started to feel better but sts that he still feels like he can't take a deep breath. Hx of same symptoms, had to have a CABG with stents placed but denies MI. Denies cp/chest discomfort. Nad, skin warm and dry, resp e/u.

## 2014-03-15 NOTE — ED Provider Notes (Signed)
Medical screening examination/treatment/procedure(s) were performed by non-physician practitioner and as supervising physician I was immediately available for consultation/collaboration.   EKG Interpretation   Date/Time:  Tuesday March 15 2014 12:21:50 EDT Ventricular Rate:  70 PR Interval:  220 QRS Duration: 88 QT Interval:  386 QTC Calculation: 416 R Axis:   40 Text Interpretation:  Sinus rhythm with 1st degree A-V block Possible  Anterior infarct , age undetermined Abnormal ECG No significant change  since last tracing Confirmed by [place]  MD, [place] [phone]) on 03/15/2014  2:03:13 PM        Ezequiel Essex, MD 03/15/14 2320

## 2014-03-15 NOTE — ED Notes (Signed)
Patient returned from X-ray 

## 2014-03-15 NOTE — ED Notes (Signed)
CBG 78. 

## 2014-03-15 NOTE — ED Notes (Signed)
Pt transported to xray 

## 2014-03-15 NOTE — ED Notes (Signed)
Carb modified, heart healthy dinner tray ordered

## 2014-03-15 NOTE — ED Provider Notes (Signed)
CSN: [id number]     Arrival date & time 03/15/14  1211 History   First MD Initiated Contact with Patient 03/15/14 1305     Chief Complaint  Patient presents with  . Shortness of Breath     (Consider location/radiation/quality/duration/timing/severity/associated sxs/prior Treatment) HPI Comments: Patient is a 73 year old male with a past medical history of gout, diabetes, hypertension, GERD, CAD (s/p CABG x 5), renal insufficiency, anemia, mixed hyperlipidemia and sleep apnea who presents to the emergency department complaining of shortness of breath beginning around 10:00 this morning while he was playing golf. Patient reports she was walking up hill and started to feel short of breath. Once he sat down for shortness of breath had subsided. Later on in the day he walked to the drug store to pick up his medications any surgical short of breath and slightly lightheaded. Again, once he sat down he was no longer short of breath. He states he was unable to take a deep breath. He reports the symptoms are similar to when he had CABG surgery. Denies chest pain, abdominal pain, nausea, vomiting, fever, chills or cough. Denies any lower extremity edema out of his normal.  Patient is a 73 y.o. male presenting with shortness of breath. The history is provided by the patient.  Shortness of Breath   Past Medical History  Diagnosis Date  . Gout   . Esophageal stricture     X 4  . Mixed hyperlipidemia   . Peptic stricture of esophagus   . Antritis (stomach)     mild  . Duodenitis     chronic  . Diverticulosis     mild, left colon  . Anemia   . DM (diabetes mellitus)   . HTN (hypertension)   . Sliding hiatal hernia   . Allergy   . Arthritis     bil knees  . GERD (gastroesophageal reflux disease)   . S/P CABG x 5 2001  . CAD (coronary artery disease)     followed by Dr. Verl Blalock  . HNP (herniated nucleus pulposus), lumbar   . Nocturia   . Sleep apnea     uses c pap with 2L O2 at night  .  Adenomatous colon polyp     tubular  . Vitamin B12 deficiency   . Spinal stenosis   . Renal insufficiency    Past Surgical History  Procedure Laterality Date  . Angioplasty  1993  . Coronary angioplasty with stent placement  05/1997  . Vasectomy    . Coronary artery bypass graft  03/2000    5 vessel  . Esophageal dilation      Dr. Lyla Son  . Tonsillectomy    . Appendectomy    . Esophagogastroduodenoscopy      multiple  . Flexible sigmoidoscopy      multiple  . Panendoscopy    . Meniscus repair Left 2011    knee  . Shoulder surgery Right 08/2010    screws placed  . Colonoscopy with polypectomy  2013  . Upper gi endoscopy  2013    Gastritis; Dr Carlean Purl  . Lumbar myelogram  04/06/13    Dr Gladstone Lighter  . Lumbar laminectomy/decompression microdiscectomy Right 06/17/2013    Procedure: LUMBAR LAMINECTOMY MICRODISCECTOMY L4-L5 RIGHT EXCISION OF SYNOVIAL CYST RIGHT   (1 LEVEL) RIGHT PARTIAL FACETECTOMY;  Surgeon: Tobi Bastos, MD;  Location: [facility];  Service: Orthopedics;  Laterality: Right;   Family History  Problem Relation Age of Onset  . Stroke Father   .  Hypertension Father   . Pancreatic cancer Mother   . Diabetes Maternal Grandmother   . Stroke Maternal Grandmother   . Heart attack Paternal Grandmother   . Colon cancer Neg Hx   . Esophageal cancer Neg Hx   . Rectal cancer Neg Hx   . Stomach cancer Neg Hx   . Ulcers Neg Hx    History  Substance Use Topics  . Smoking status: Never Smoker   . Smokeless tobacco: Never Used  . Alcohol Use: Yes     Comment: rarely    Review of Systems  Respiratory: Positive for shortness of breath.   Neurological: Positive for light-headedness.  All other systems reviewed and are negative.     Allergies  Benazepril; Hctz; and Aspirin  Home Medications   Prior to Admission medications   Medication Sig Start Date End Date Taking? Authorizing Provider  albuterol (PROVENTIL HFA;VENTOLIN HFA) 108 (90 BASE) MCG/ACT inhaler  Inhale 2 puffs into the lungs every 6 (six) hours as needed for wheezing or shortness of breath. 09/27/13  Yes Brunetta Jeans, PA-C  allopurinol (ZYLOPRIM) 300 MG tablet Take 450 mg by mouth daily.    Yes Historical Provider, MD  amLODipine (NORVASC) 5 MG tablet TAKE ONE TABLET BY MOUTH ONE TIME DAILY    Yes Minus Breeding, MD  Ascorbic Acid (VITAMIN C) 500 MG tablet Take 500 mg by mouth every morning.    Yes Historical Provider, MD  aspirin 81 MG tablet Take 81 mg by mouth daily.   Yes Historical Provider, MD  atorvastatin (LIPITOR) 80 MG tablet TAKE ONE TABLET BY MOUTH AT BEDTIME    Yes Minus Breeding, MD  esomeprazole (NEXIUM) 40 MG capsule Take 1 capsule (40 mg total) by mouth daily. 10/15/12  Yes [name], MD  fenofibrate 160 MG tablet TAKE ONE TABLET BY MOUTH ONE TIME DAILY    Yes Yvonne R Lowne, DO  fexofenadine (ALLEGRA) 180 MG tablet Take 90 mg by mouth 2 (two) times daily.    Yes Historical Provider, MD  fluticasone (FLONASE) 50 MCG/ACT nasal spray Place 1 spray into both nostrils 2 (two) times daily as needed for allergies or rhinitis. 02/03/14  Yes Alferd Apa Lowne, DO  furosemide (LASIX) 40 MG tablet TAKE ONE TABLET BY MOUTH TWICE DAILY    Yes Minus Breeding, MD  glimepiride (AMARYL) 2 MG tablet Take 1 mg by mouth daily before breakfast. 12/22/13  Yes Rosalita Chessman, DO  glucose blood test strip One touch Verio test strips Check blood sugar once daily Dx 250.00 02/11/14  Yes Yvonne R Lowne, DO  metFORMIN (GLUCOPHAGE) 1000 MG tablet TAKE ONE TABLET BY MOUTH TWICE DAILY WITH MEALS  12/27/13  Yes Alferd Apa Lowne, DO  metoprolol succinate (TOPROL-XL) 100 MG 24 hr tablet Take 150 mg by mouth daily with breakfast. Take with or immediately following a meal.   Yes Historical Provider, MD  [facility] DELICA LANCETS FINE MISC Check blood sugar once daily Dx 250.00 02/11/14  Yes Alferd Apa Lowne, DO  potassium chloride SA (K-DUR,KLOR-CON) 20 MEQ tablet Take 20 mEq by mouth 2 (two) times daily. 06/09/13   Yes Minus Breeding, MD  pregabalin (LYRICA) 75 MG capsule Take 75 mg by mouth 2 (two) times daily.   Yes Historical Provider, MD  psyllium (HYDROCIL/METAMUCIL) 95 % PACK Take 1 packet by mouth 2 (two) times daily.   Yes Historical Provider, MD  ZETIA 10 MG tablet Take one tablet by mouth one time daily   Yes  Minus Breeding, MD  nitroGLYCERIN (NITROSTAT) 0.4 MG SL tablet Place 0.4 mg under the tongue every 5 (five) minutes as needed for chest pain.    Historical Provider, MD   BP 111/54  Pulse 59  Temp(Src) 97.9 F (36.6 C) (Oral)  Resp 22  Ht 6' (1.829 m)  Wt 260 lb (117.935 kg)  BMI 35.25 kg/m2  SpO2 93% Physical Exam  Nursing note and vitals reviewed. Constitutional: He is oriented to person, place, and time. He appears well-developed and well-nourished. No distress.  HENT:  Head: Normocephalic and atraumatic.  Mouth/Throat: Oropharynx is clear and moist.  Eyes: Conjunctivae and EOM are normal. Pupils are equal, round, and reactive to light.  Neck: Normal range of motion. Neck supple. No JVD present.  Cardiovascular: Normal rate, regular rhythm, normal heart sounds and intact distal pulses.   No extremity edema.  Pulmonary/Chest: Effort normal and breath sounds normal. No respiratory distress.  Abdominal: Soft. Bowel sounds are normal. There is no tenderness.  Musculoskeletal: Normal range of motion. He exhibits no edema.  Neurological: He is alert and oriented to person, place, and time. He has normal strength. No sensory deficit.  Speech fluent, goal oriented. Moves limbs without ataxia. Equal grip strength bilateral.  Skin: Skin is warm and dry. He is not diaphoretic.  Psychiatric: He has a normal mood and affect. His behavior is normal.    ED Course  Procedures (including critical care time) Labs Review Labs Reviewed  CBC - Abnormal; Notable for the following:    RDW 16.3 (*)    All other components within normal limits  BASIC METABOLIC PANEL - Abnormal; Notable for  the following:    Glucose, Bld 101 (*)    GFR calc non Af Amer 64 (*)    GFR calc Af Amer 75 (*)    All other components within normal limits  PRO B NATRIURETIC PEPTIDE  I-STAT TROPOININ, ED    Imaging Review Dg Chest 2 View  03/15/2014   CLINICAL DATA:  Dyspnea with exertion  EXAM: CHEST  2 VIEW  COMPARISON:  June 10, 2013  FINDINGS: There is no edema or consolidation. Heart size and pulmonary vascularity are normal. No adenopathy. Patient is status post coronary artery bypass grafting. There is degenerative change in the thoracic spine.  IMPRESSION: No edema or consolidation.   Electronically Signed   By: Lowella Grip M.D.   On: 03/15/2014 14:00     EKG Interpretation   Date/Time:  Tuesday March 15 2014 12:21:50 EDT Ventricular Rate:  70 PR Interval:  220 QRS Duration: 88 QT Interval:  386 QTC Calculation: 416 R Axis:   40 Text Interpretation:  Sinus rhythm with 1st degree A-V block Possible  Anterior infarct , age undetermined Abnormal ECG No significant change  since last tracing Confirmed by [place] ([id number]) on 03/15/2014  2:03:13 PM      MDM   Final diagnoses:  None   Patient presents with shortness of breath on exertion. He is nontoxic appearing and in no apparent distress. Afebrile, vital signs stable. O2 sat on room air 93-96%. History of CAD and CABG as states above. Workup negative. Troponin negative. Given pt's cardiac hx, cardiology consult appreciated. Pt evaluated by cardiology PA who will discuss with her attending, [place] pending cardiologist.  Pt signed out to [facility], PA-C at shift change.  Case discussed with attending Dr. Regenia Skeeter who also evaluated patient and agrees with plan of care.   Illene Labrador, PA-C  03/15/14 1635 

## 2014-03-15 NOTE — Consult Note (Addendum)
Reason for Consult: DOE + lightheadeness  Referring Physician: [facility]  Primary Cardiologist: Dr. Percival Spanish (previously Dr. Verl Blalock)   HPI: The patient is a 73 year old male, formerly followed by Dr. Verl Blalock and is now followed by Dr. Percival Spanish. He is followed medically by Dr. Etter Sjogren. His past medical history is significant for CAD, status post previous coronary angioplasty and stent placement in the 1990s as well as five-vessel bypass surgery in 2001, performed by Dr. Cyndia Bent. His history is also significant for obesity, OSA compliant with CPAP therapy, hypertension, hyperlipidemia and diabetes. He denies any history of tobacco use. Since 2001, he has not required repeat cardiac catheterization. He self-reports that he had a stress test 5 years ago that was normal. His last 2-D echocardiogram was 10/12/2013, which demonstrated normal systolic function with an estimated ejection fraction in the range of 55-65%. Wall motion was normal; there were no regional wall motion abnormalities. Mild tricuspid regurgitation was noted. His last office visit with Dr. Percival Spanish was 05/25/2014. At that time he was felt to be stable from a cardiac standpoint. Last lipid panel was 11/2013 with LDL at goal at 52. Last Hgb A1c also 11/2013 and was slightly elevated at 7.5.   He presents to the [facility] ER today for evaluation of symptoms consistent with what he believes is his anginal equivalent. He states that he was in his usual state of health until this morning. He was outside playing golf today, which is his usual recreational activity. While walking up an incline on the golf course (less than 20 feet), he noted "feeling funny". He became short of breath, lightheaded, and felt weak in his legs. No associated chest pain, palpitations, nausea, vomiting, syncope/near-syncope. These are the same symptoms he experienced when he presented in 2001 and it was discovered that he would need 5 vessel bypass. He denies any chest pain associated  with his symptoms in 2001. With his previous coronary stents, prior to bypass, he did have sharp/stabbing scapular pain, however he denies any recent similar scapular discomfort. He states that his symptoms resolved once he stopped to rest. He continued to play a full 8 holes of golf. He used a [employer] laboratory technician for most of the way. He denied encountering any additional hills and denied any recurrent symptoms during the remainder of his golf outing. However, later this morning while he was walking into [employer], he had recurrent symptoms of lightheadedness with bilateral leg weakness, prompting him to seek evaluation.   Since arrival to the ER, he has remained asymptomatic. His EKG is non ischemic. Telemetry shows Type I second degree AV blck. Current rate is in the 60s. POC troponin is negative x 1. BNP is normal at 65. His CXR is unremarkable. CBC and BMP also unremarkable. He continues to deny any chest or scapular discomfort. He denies any resting dyspnea. He reports full medication compliance.    Past Medical History   Diagnosis  Date   .  Gout    .  Esophageal stricture      X 4   .  Mixed hyperlipidemia    .  Peptic stricture of esophagus    .  Antritis (stomach)      mild   .  Duodenitis      chronic   .  Diverticulosis      mild, left colon   .  Anemia    .  DM (diabetes mellitus)    .  HTN (hypertension)    .  Sliding  hiatal hernia    .  Allergy    .  Arthritis      bil knees   .  GERD (gastroesophageal reflux disease)    .  S/P CABG x 5  2001   .  CAD (coronary artery disease)      followed by Dr. Verl Blalock   .  HNP (herniated nucleus pulposus), lumbar    .  Nocturia    .  Sleep apnea      uses c pap with 2L O2 at night   .  Adenomatous colon polyp      tubular   .  Vitamin B12 deficiency    .  Spinal stenosis    .  Renal insufficiency     Past Surgical History   Procedure  Laterality  Date   .  Angioplasty   1993   .  Coronary angioplasty with stent placement   05/1997   .   Vasectomy     .  Coronary artery bypass graft   03/2000     5 vessel   .  Esophageal dilation       Dr. Lyla Son   .  Tonsillectomy     .  Appendectomy     .  Esophagogastroduodenoscopy       multiple   .  Flexible sigmoidoscopy       multiple   .  Panendoscopy     .  Meniscus repair  Left  2011     knee   .  Shoulder surgery  Right  08/2010     screws placed   .  Colonoscopy with polypectomy   2013   .  Upper gi endoscopy   2013     Gastritis; Dr Carlean Purl   .  Lumbar myelogram   04/06/13     Dr Gladstone Lighter   .  Lumbar laminectomy/decompression microdiscectomy  Right  06/17/2013     Procedure: LUMBAR LAMINECTOMY MICRODISCECTOMY L4-L5 RIGHT EXCISION OF SYNOVIAL CYST RIGHT (1 LEVEL) RIGHT PARTIAL FACETECTOMY; Surgeon: Tobi Bastos, MD; Location: [facility] OR[facility]; Service: Orthopedics; Laterality: Right;    Family History   Problem  Relation  Age of Onset   .  Stroke  Father    .  Hypertension  Father    .  Pancreatic cancer  Mother    .  Diabetes  Maternal Grandmother    .  Stroke  Maternal Grandmother    .  Heart attack  Paternal Grandmother    .  Colon cancer  Neg Hx    .  Esophageal cancer  Neg Hx    .  Rectal cancer  Neg Hx    .  Stomach cancer  Neg Hx    .  Ulcers  Neg Hx     Social History: reports that he has never smoked. He has never used smokeless tobacco. He reports that he drinks alcohol. He reports that he does not use illicit drugs.  Allergies:  Allergies   Allergen  Reactions   .  Benazepril      angioedema; he is not a candidate for any angiotensin receptor blockers because of this significant allergic reaction.  Because of a history of documented adverse serious drug reaction;Medi Alert bracelet is recommended   .  Hctz [Hydrochlorothiazide]  Swelling     Tongue and lip swelling   .  Aspirin  Other (See Comments)     Gastritis, cant take 325 Mg aspirin    Prior  to Admission medications   Medication  Sig  Start Date  End Date  Taking?  Authorizing Provider     albuterol (PROVENTIL HFA;VENTOLIN HFA) 108 (90 BASE) MCG/ACT inhaler  Inhale 2 puffs into the lungs every 6 (six) hours as needed for wheezing or shortness of breath.  09/27/13   Yes  Brunetta Jeans, PA-C   allopurinol (ZYLOPRIM) 300 MG tablet  Take 450 mg by mouth daily.    Yes  Historical Provider, MD   amLODipine (NORVASC) 5 MG tablet  TAKE ONE TABLET BY MOUTH ONE TIME DAILY    Yes  Minus Breeding, MD   Ascorbic Acid (VITAMIN C) 500 MG tablet  Take 500 mg by mouth every morning.    Yes  Historical Provider, MD   aspirin 81 MG tablet  Take 81 mg by mouth daily.    Yes  Historical Provider, MD   atorvastatin (LIPITOR) 80 MG tablet  TAKE ONE TABLET BY MOUTH AT BEDTIME    Yes  Minus Breeding, MD   esomeprazole (NEXIUM) 40 MG capsule  Take 1 capsule (40 mg total) by mouth daily.  10/15/12   Yes  [name], MD   fenofibrate 160 MG tablet  TAKE ONE TABLET BY MOUTH ONE TIME DAILY    Yes  Yvonne R Lowne, DO   fexofenadine (ALLEGRA) 180 MG tablet  Take 90 mg by mouth 2 (two) times daily.    Yes  Historical Provider, MD   fluticasone (FLONASE) 50 MCG/ACT nasal spray  Place 1 spray into both nostrils 2 (two) times daily as needed for allergies or rhinitis.  02/03/14   Yes  Alferd Apa Lowne, DO   furosemide (LASIX) 40 MG tablet  TAKE ONE TABLET BY MOUTH TWICE DAILY    Yes  Minus Breeding, MD   glimepiride (AMARYL) 2 MG tablet  Take 1 mg by mouth daily before breakfast.  12/22/13   Yes  Rosalita Chessman, DO   glucose blood test strip  One touch Verio test strips Check blood sugar once daily Dx 250.00  02/11/14   Yes  Yvonne R Lowne, DO   metFORMIN (GLUCOPHAGE) 1000 MG tablet  TAKE ONE TABLET BY MOUTH TWICE DAILY WITH MEALS  12/27/13   Yes  Alferd Apa Lowne, DO   metoprolol succinate (TOPROL-XL) 100 MG 24 hr tablet  Take 150 mg by mouth daily with breakfast. Take with or immediately following a meal.    Yes  Historical Provider, MD   [facility] DELICA LANCETS FINE MISC  Check blood sugar once daily Dx 250.00  02/11/14    Yes  Alferd Apa Lowne, DO   potassium chloride SA (K-DUR,KLOR-CON) 20 MEQ tablet  Take 20 mEq by mouth 2 (two) times daily.  06/09/13   Yes  Minus Breeding, MD   pregabalin (LYRICA) 75 MG capsule  Take 75 mg by mouth 2 (two) times daily.    Yes  Historical Provider, MD   psyllium (HYDROCIL/METAMUCIL) 95 % PACK  Take 1 packet by mouth 2 (two) times daily.    Yes  Historical Provider, MD   ZETIA 10 MG tablet  Take one tablet by mouth one time daily    Yes  Minus Breeding, MD   nitroGLYCERIN (NITROSTAT) 0.4 MG SL tablet  Place 0.4 mg under the tongue every 5 (five) minutes as needed for chest pain.     Historical Provider, MD    Results for orders placed during the hospital encounter of 03/15/14 (from the past  48 hour(s))   CBC Status: Abnormal    Collection Time    03/15/14 12:28 PM   Result  Value  Ref Range    WBC  7.5  4.0 - 10.5 K/uL    RBC  4.75  4.22 - 5.81 MIL/uL    Hemoglobin  13.1  13.0 - 17.0 g/dL    HCT  39.5  39.0 - 52.0 %    MCV  83.2  78.0 - 100.0 fL    MCH  27.6  26.0 - 34.0 pg    MCHC  33.2  30.0 - 36.0 g/dL    RDW  16.3 (*)  11.5 - 15.5 %    Platelets  261  150 - 400 K/uL   BASIC METABOLIC PANEL Status: Abnormal    Collection Time    03/15/14 12:28 PM   Result  Value  Ref Range    Sodium  140  137 - 147 mEq/L    Potassium  4.0  3.7 - 5.3 mEq/L    Chloride  102  96 - 112 mEq/L    CO2  23  19 - 32 mEq/L    Glucose, Bld  101 (*)  70 - 99 mg/dL    BUN  20  6 - 23 mg/dL    Creatinine, Ser  1.09  0.50 - 1.35 mg/dL    Calcium  9.4  8.4 - 10.5 mg/dL    GFR calc non Af Amer  64 (*)  >90 mL/min    GFR calc Af Amer  75 (*)  >90 mL/min    Comment:  (NOTE)     The eGFR has been calculated using the CKD EPI equation.     This calculation has not been validated in all clinical situations.     eGFR's persistently <90 mL/min signify possible Chronic Kidney     Disease.    Anion gap  15  5 - 15   PRO B NATRIURETIC PEPTIDE Status: None    Collection Time    03/15/14 12:28 PM    Result  Value  Ref Range    Pro B Natriuretic peptide (BNP)  65.5  0 - 450 pg/mL   I-STAT TROPOININ, ED Status: None    Collection Time    03/15/14 1:43 PM   Result  Value  Ref Range    Troponin i, poc  0.00  0.00 - 0.08 ng/mL    Comment 3      Comment:  Due to the release kinetics of cTnI,     a negative result within the first hours     of the onset of symptoms does not rule out     myocardial infarction with certainty.     If myocardial infarction is still suspected,     repeat the test at appropriate intervals.    Dg Chest 2 View  03/15/2014 CLINICAL DATA: Dyspnea with exertion EXAM: CHEST 2 VIEW COMPARISON: June 10, 2013 FINDINGS: There is no edema or consolidation. Heart size and pulmonary vascularity are normal. No adenopathy. Patient is status post coronary artery bypass grafting. There is degenerative change in the thoracic spine. IMPRESSION: No edema or consolidation. Electronically Signed By: Lowella Grip M.D. On: 03/15/2014 14:00    Review of Systems  Constitutional: Negative for diaphoresis.  Respiratory: Positive for shortness of breath.  Cardiovascular: Negative for chest pain, palpitations, orthopnea, leg swelling and PND.  Gastrointestinal: Negative for nausea, vomiting and abdominal pain.  Musculoskeletal: Negative for back pain and neck  pain.  Neurological: Positive for dizziness and weakness (bilteral legs). Negative for loss of consciousness.  All other systems reviewed and are negative.    Blood pressure 122/62, pulse 59, temperature 97.9 F (36.6 C), temperature source Oral, resp. rate 22, height 6' (1.829 m), weight 260 lb (117.935 kg), SpO2 94.00%.  Physical Exam  Constitutional: He is oriented to person, place, and time. He appears well-developed and well-nourished. No distress.  Neck: No JVD present. Carotid bruit is not present.  Cardiovascular: Normal rate, regular rhythm and intact distal pulses. Exam reveals no gallop and no friction rub.   Murmur (2/6 systolic best heard along RUSB) heard.  Pulses:  Radial pulses are 2+ on the right side, and 2+ on the left side.  Dorsalis pedis pulses are 2+ on the right side, and 2+ on the left side.  Respiratory: Effort normal and breath sounds normal. No respiratory distress. He has no wheezes. He has no rales.  GI: Soft. Bowel sounds are normal. He exhibits no distension and no mass. There is no tenderness.  Musculoskeletal: He exhibits no edema.  Neurological: He is alert and oriented to person, place, and time.  Skin: Skin is warm and dry. He is not diaphoretic.  Psychiatric: He has a normal mood and affect. His behavior is normal.    Assessment/Plan:  Active Problems:  DOE (dyspnea on exertion)  Lightheadedness     1. DOE + lightheadedness + bilateral leg weakness/unstable angina: pt reports symptoms are similar to prior pre-GABG symptoms. No associated CP/scapular pain. Currently asymptomatic. POC troponin at 13:43 was negative. EKG is nonischemic. Last 2D echo was 09/2013 revealing normal LVF and no WMA. However, given the similarity of his symptoms, will plan for a diagnostic LHC tomorrow to reassess coronary anatomy. Will not heprinize unless cardiac enzymes return positive. NPO at midnight. Continue current home meds: ASA, BB and statin. No ACE give h/o angioedema.   2. 2nd Degree AV block Type 1: Captured on telemetry. PPM not indicated.    3. HTN: well controlled. Continue current home meds.    4. HLD: last lipid panel revealed LDL was at goal. Continue Lipitor.   5. OSA: will arrange for CPAP therapy.    Lyda Jester  03/15/2014, 3:39 PM    Patient seen and examined with Brittainy Simmons PA-C. We discussed all aspects of the encounter. I agree with the assessment and plan as stated above. Symptoms very concerning for new onset exertional dyspnea/USA in patient with remote CABG. CE and ECG negative. We discussed stress test vs cath. He wants to proceed with  cath. I agree.  Will admit. Cycle markers. Treat with asa, b-blocker, statin. No heparin at this point unless CP returns or CE positive. Has AS murmur on exam but AS mild by recent echo.   Daniel Bensimhon,MD 5:03 PM

## 2014-03-16 ENCOUNTER — Encounter (HOSPITAL_COMMUNITY)
Admission: EM | Disposition: A | Discharge status: Home / Self Care | Payer: Self-pay | Source: Home / Self Care | Attending: Emergency Medicine

## 2014-03-16 ENCOUNTER — Other Ambulatory Visit: Payer: Self-pay

## 2014-03-16 DIAGNOSIS — G4733 Obstructive sleep apnea (adult) (pediatric): Secondary | ICD-10-CM | POA: Diagnosis not present

## 2014-03-16 DIAGNOSIS — N182 Chronic kidney disease, stage 2 (mild): Secondary | ICD-10-CM

## 2014-03-16 DIAGNOSIS — E785 Hyperlipidemia, unspecified: Secondary | ICD-10-CM

## 2014-03-16 DIAGNOSIS — I2510 Atherosclerotic heart disease of native coronary artery without angina pectoris: Secondary | ICD-10-CM | POA: Diagnosis not present

## 2014-03-16 DIAGNOSIS — R0609 Other forms of dyspnea: Secondary | ICD-10-CM | POA: Diagnosis not present

## 2014-03-16 HISTORY — PX: LEFT HEART CATHETERIZATION WITH CORONARY/GRAFT ANGIOGRAM: SHX5450

## 2014-03-16 LAB — CBC
HEMATOCRIT: 39.6 % (ref 39.0–52.0)
Hemoglobin: 12.7 g/dL — ABNORMAL LOW (ref 13.0–17.0)
MCH: 26.7 pg (ref 26.0–34.0)
MCHC: 32.1 g/dL (ref 30.0–36.0)
MCV: 83.4 fL (ref 78.0–100.0)
Platelets: 236 10*3/uL (ref 150–400)
RBC: 4.75 MIL/uL (ref 4.22–5.81)
RDW: 16.4 % — AB (ref 11.5–15.5)
WBC: 6.7 10*3/uL (ref 4.0–10.5)

## 2014-03-16 LAB — BASIC METABOLIC PANEL
ANION GAP: 12 (ref 5–15)
BUN: 20 mg/dL (ref 6–23)
CALCIUM: 9.5 mg/dL (ref 8.4–10.5)
CO2: 24 meq/L (ref 19–32)
CREATININE: 1.07 mg/dL (ref 0.50–1.35)
Chloride: 104 mEq/L (ref 96–112)
GFR calc non Af Amer: 66 mL/min — ABNORMAL LOW (ref >90)
GFR, EST AFRICAN AMERICAN: 76 mL/min — AB (ref >90)
Glucose, Bld: 118 mg/dL — ABNORMAL HIGH (ref 70–99)
Potassium: 4.1 mEq/L (ref 3.7–5.3)
SODIUM: 140 meq/L (ref 137–147)

## 2014-03-16 LAB — GLUCOSE, CAPILLARY
GLUCOSE-CAPILLARY: 114 mg/dL — AB (ref 70–99)
GLUCOSE-CAPILLARY: 146 mg/dL — AB (ref 70–99)
GLUCOSE-CAPILLARY: 122 mg/dL — AB (ref 70–99)
Glucose-Capillary: 72 mg/dL (ref 70–99)

## 2014-03-16 LAB — PROTIME-INR
INR: 1.15 (ref 0.00–1.49)
Prothrombin Time: 14.7 seconds (ref 11.6–15.2)

## 2014-03-16 LAB — TROPONIN I

## 2014-03-16 SURGERY — LEFT HEART CATHETERIZATION WITH CORONARY/GRAFT ANGIOGRAM
Anesthesia: LOCAL

## 2014-03-16 MED ORDER — HEPARIN (PORCINE) IN NACL 2-0.9 UNIT/ML-% IJ SOLN
INTRAMUSCULAR | Status: AC
  Filled 2014-03-16: qty 1000

## 2014-03-16 MED ORDER — FENTANYL CITRATE 0.05 MG/ML IJ SOLN
INTRAMUSCULAR | Status: AC
  Filled 2014-03-16: qty 2

## 2014-03-16 MED ORDER — POTASSIUM CHLORIDE CRYS ER 20 MEQ PO TBCR: 20.0000 meq | EXTENDED_RELEASE_TABLET | Freq: Two times a day (BID) | ORAL | Status: DC

## 2014-03-16 MED ORDER — SODIUM CHLORIDE 0.9 % IV SOLN
INTRAVENOUS | Status: AC
  Administered 2014-03-16: 19:00:00 via INTRAVENOUS

## 2014-03-16 MED ORDER — NITROGLYCERIN 1 MG/10 ML FOR IR/CATH LAB
INTRA_ARTERIAL | Status: AC
  Filled 2014-03-16: qty 10

## 2014-03-16 MED ORDER — LIDOCAINE HCL (PF) 1 % IJ SOLN
INTRAMUSCULAR | Status: AC
  Filled 2014-03-16: qty 30

## 2014-03-16 MED ORDER — MIDAZOLAM HCL 2 MG/2ML IJ SOLN
INTRAMUSCULAR | Status: AC
  Filled 2014-03-16: qty 2

## 2014-03-16 MED ORDER — FLUTICASONE PROPIONATE 50 MCG/ACT NA SUSP
2.0000 | Freq: Every day | NASAL | Status: DC
  Administered 2014-03-16 – 2014-03-17 (×2): 2 via NASAL
  Filled 2014-03-16: qty 16

## 2014-03-16 MED ORDER — METOPROLOL SUCCINATE ER 100 MG PO TB24
100.0000 mg | ORAL_TABLET | Freq: Every day | ORAL | Status: DC
  Filled 2014-03-16: qty 1

## 2014-03-16 NOTE — Progress Notes (Signed)
Site area:  Right groin 5 fr arterial sheath removed    Site Prior to Removal:  Level 0  Pressure Applied For 20 MINUTES    Minutes Beginning at 1645  Manual:   Yes.    Patient Status During Pull:  stable  Post Pull Groin Site:  Level 0  Post Pull Instructions Given:  Yes.    Post Pull Pulses Present:  Yes.    Dressing Applied:  Yes.    Comments:  VS remain stable during sheath pull.  Pt denies any discomfort at this time.

## 2014-03-16 NOTE — Progress Notes (Signed)
Co-signed for Sabrina Lewis RN/BSN for assessment, IV assessment, medication administration, care plan, patient education, I's & O's, progress notes, and vital signs. Deegan Valentino M, RN/BSN  

## 2014-03-16 NOTE — Progress Notes (Signed)
Utilization Review Completed.Richard [name] T9/16/2015  

## 2014-03-16 NOTE — Progress Notes (Signed)
Patient Name: Richard [name] Date of Encounter: 03/16/2014     Active Problems:   DOE (dyspnea on exertion)   Lightheadedness   Intermediate coronary syndrome    SUBJECTIVE  Denies ever having any chest pain. States he has been experiencing exertional weakness, SOB and dizziness which is similar to what he experienced before his CABG in 2001. No symptom this morning  CURRENT MEDS . allopurinol  450 mg Oral Daily  . amLODipine  5 mg Oral Daily  . [START ON 03/17/2014] aspirin EC  81 mg Oral Daily  . atorvastatin  80 mg Oral q1800  . ezetimibe  10 mg Oral Daily  . fenofibrate  54 mg Oral Daily  . furosemide  40 mg Oral Daily  . glimepiride  1 mg Oral QAC breakfast  . heparin  5,000 Units Subcutaneous 3 times per day  . metoprolol succinate  150 mg Oral Q breakfast  . pantoprazole  80 mg Oral Q1200  . potassium chloride SA  20 mEq Oral BID  . pregabalin  75 mg Oral BID  . sodium chloride  3 mL Intravenous Q12H    OBJECTIVE  Filed Vitals:   03/15/14 2230 03/16/14 0123 03/16/14 0530 03/16/14 0544  BP:  133/56 127/72   Pulse: 66 62 56   Temp:  97.5 F (36.4 C) 97.5 F (36.4 C)   TempSrc:  Oral Oral   Resp: 16 17 18    Height:      Weight:    276 lb 3.2 oz (125.283 kg)  SpO2: 96% 94% 96%     Intake/Output Summary (Last 24 hours) at 03/16/14 1046 Last data filed at 03/16/14 0900  Gross per 24 hour  Intake    600 ml  Output    780 ml  Net   -180 ml   Filed Weights   03/15/14 1225 03/15/14 1951 03/16/14 0544  Weight: 260 lb (117.935 kg) 278 lb 12.8 oz (126.463 kg) 276 lb 3.2 oz (125.283 kg)    PHYSICAL EXAM  General: Pleasant, NAD. Neuro: Alert and oriented X 3. Moves all extremities spontaneously. Psych: Normal affect. HEENT:  Normal  Neck: Supple without bruits or JVD. Lungs:  Resp regular and unlabored, CTA. Heart: RRR no s3, s4, or murmurs. Abdomen: Soft, non-tender, non-distended, BS + x 4.  Extremities: No clubbing, cyanosis or edema. DP/PT/Radials  2+ and equal bilaterally.  Accessory Clinical Findings  CBC  Recent Labs  03/15/14 1228 03/16/14 0500  WBC 7.5 6.7  HGB 13.1 12.7*  HCT 39.5 39.6  MCV 83.2 83.4  PLT 261 [id number]   Basic Metabolic Panel  Recent Labs  03/15/14 1228 03/16/14 0500  NA 140 140  K 4.0 4.1  CL 102 104  CO2 23 24  GLUCOSE 101* 118*  BUN 20 20  CREATININE 1.09 1.07  CALCIUM 9.4 9.5   Cardiac Enzymes  Recent Labs  03/15/14 1714 03/15/14 2327 03/16/14 0514  TROPONINI <0.30 <0.30 <0.30    TELE NSR with HR 50-60s, frequently dip down to 30s this morning due to failed conduction of P waves    ECG  NSR with 1st degree heart block  Echocardiogram  10/12/2013 LV EF: 55% - 65%  ------------------------------------------------------------ Indications: CAD of native vessels 414.01. Murmur 785.2.  ------------------------------------------------------------ History: PMH: Acquired from the patient and from the patient's chart. Dyspnea and murmur. Coronary artery disease. Risk factors: Hypertension. Diabetes mellitus. Dyslipidemia. OSA.  ------------------------------------------------------------ Study Conclusions  - Left ventricle: The cavity size was normal. Wall thickness  was increased in a pattern of mild LVH. Systolic function was normal. The estimated ejection fraction was in the range of 55% to 65%. Wall motion was normal; there were no regional wall motion abnormalities. - Aortic valve: Valve area: 1.67cm^2(VTI). Valve area: 1.78cm^2 (Vmax). - Left atrium: The atrium was moderately dilated. - Right atrium: The atrium was moderately dilated. - Pulmonary arteries: Systolic pressure was mildly increased. PA peak pressure: 34mm Hg (S).       Radiology/Studies  Dg Chest 2 View  03/15/2014   CLINICAL DATA:  Dyspnea with exertion  EXAM: CHEST  2 VIEW  COMPARISON:  June 10, 2013  FINDINGS: There is no edema or consolidation. Heart size and pulmonary vascularity are  normal. No adenopathy. Patient is status post coronary artery bypass grafting. There is degenerative change in the thoracic spine.  IMPRESSION: No edema or consolidation.   Electronically Signed   By: Lowella Grip M.D.   On: 03/15/2014 14:00    ASSESSMENT AND PLAN  1. DOE + lightheadedness + bilateral leg weakness/unstable angina  - similar to what he experienced prior to previous CABG, states he never had CP before even before his CABG  - troponin negative overnight, continue ASA, BB, Lipitor, lasix  - plan for cardiac cath this afternoon to definitively assess coronary anatomy  - has frequently failed conducting P waves on telemetry this morning, consider decrease BB dose  2. CAD s/p CABG x 5 in 2001 by Dr. Cyndia Bent 3. HTN 4. HLD 5. OSA 6. CKD, stage II  Signed, Woodward Ku Pager: [phone]  Personally seen and examined. Agree with above. TELE with Second degree HB type 1. Will pull back on metoprolol.  Cath today. Candee Furbish, MD

## 2014-03-16 NOTE — Progress Notes (Signed)
Pt's HR 36-57 while asleep, HR up to 60's SR while awake, episode of Second degree heart block Type 1, denies any chest pain, denies nausea and vomiting, not in respiratory distress. Dr Elias Else was notified. No new order given at this time.Will continue to monitor pt

## 2014-03-16 NOTE — Progress Notes (Signed)
Patient still on bed rest. No signs of bleeding. Report given to receiving RN. Family at bedside. No verbal complaints and no signs of distress or discomfort noted.

## 2014-03-16 NOTE — CV Procedure (Signed)
     Cardiac Catheterization Operative Report  CRISTON BOKOR CE:[id number] 9/16/20154:39 PM Garnet Koyanagi, DO  Procedure Performed:  1. Left Heart Catheterization 2. Selective Coronary Angiography 3. SVG angiography 4. LIMA graft angiography 5. RIMA graft angiography 6. Left ventricular angiogram  Operator: Lauree Chandler, MD  Indication:  73 yo male with history of CAD s/p 5V CABG admitted with weakness, dyspnea and found to have negative cardiac markers, second degree AV block. No chest pain.                                Procedure Details: The risks, benefits, complications, treatment options, and expected outcomes were discussed with the patient. The patient and/or family concurred with the proposed plan, giving informed consent. The patient was brought to the cath lab after IV hydration was begun and oral premedication was given. The patient was further sedated with Versed and Fentanyl. The right groin was prepped and draped in the usual manner. Using the modified Seldinger access technique, a 5 French sheath was placed in the right femoral artery. Standard diagnostic catheters were used to perform selective coronary angiography. The JR-4 catheter was used to engage both saphenous vein grafts and the RIMA graft. An IMA catheter was used to engage the LIMA graft. A pigtail catheter was used to perform a left ventricular angiogram.  There were no immediate complications. The patient was taken to the recovery area in stable condition.   Hemodynamic Findings: Central aortic pressure: 129/61 Left ventricular pressure: 138/4/20  Angiographic Findings:  Left main: 20% distal stenosis.   Left Anterior Descending Artery: Large caliber vessel with 100% mid occlusion. The mid and distal vessel fills from the patent IMA graft.   Circumflex Artery: Large caliber vessel with two obtuse marginal branches. The first OM branch has diffuse proximal 90% stenosis. The second OM branch is  occluded. Both OM branches fill from the patent sequential vein graft.   Right Coronary Artery: Large dominant vessel with 99% proximal stenosis followed by 100% mid occlusion. The distal vessel fills from the patent vein graft.   Graft Anatomy:  SVG sequential to OM1/OM2 is patent SVG to Diagonal is occluded RIMA to RCA is patent LIMA to mid LAD is patent  Left Ventricular Angiogram: LVEF=50-55%.   Impression: 1. Triple vessel CAD s/p 5V CABG with 4/5 patent bypass grafts 2. Normal LV systolic function 3. Second degree AV block noted during the case  Recommendations: Continue medical management of CAD. If pt remains dizzy and dyspneic when standing and walking with second degree AV block, may need to consider EP consultation for pacemaker.        Complications:  None. The patient tolerated the procedure well.

## 2014-03-16 NOTE — Interval H&P Note (Signed)
History and Physical Interval Note:  03/16/2014 3:45 PM  Richard [name]  has presented today for cardiac cath with the diagnosis of CAD, dyspnea.  The various methods of treatment have been discussed with the patient and family. After consideration of risks, benefits and other options for treatment, the patient has consented to  Procedure(s): LEFT HEART CATHETERIZATION WITH CORONARY/GRAFT ANGIOGRAM (N/A) as a surgical intervention .  The patient's history has been reviewed, patient examined, no change in status, stable for surgery.  I have reviewed the patient's chart and labs.  Questions were answered to the patient's satisfaction.    Cath Lab Visit (complete for each Cath Lab visit)  Clinical Evaluation Leading to the Procedure:   ACS: No.  Non-ACS:    Anginal Classification: CCS II  Anti-ischemic medical therapy: Maximal Therapy (2 or more classes of medications)  Non-Invasive Test Results: No non-invasive testing performed  Prior CABG: Previous CABG        MCALHANY,CHRISTOPHER

## 2014-03-16 NOTE — H&P (View-Only) (Signed)
Patient Name: Richard [name] Date of Encounter: 03/16/2014     Active Problems:   DOE (dyspnea on exertion)   Lightheadedness   Intermediate coronary syndrome    SUBJECTIVE  Denies ever having any chest pain. States he has been experiencing exertional weakness, SOB and dizziness which is similar to what he experienced before his CABG in 2001. No symptom this morning  CURRENT MEDS . allopurinol  450 mg Oral Daily  . amLODipine  5 mg Oral Daily  . [START ON 03/17/2014] aspirin EC  81 mg Oral Daily  . atorvastatin  80 mg Oral q1800  . ezetimibe  10 mg Oral Daily  . fenofibrate  54 mg Oral Daily  . furosemide  40 mg Oral Daily  . glimepiride  1 mg Oral QAC breakfast  . heparin  5,000 Units Subcutaneous 3 times per day  . metoprolol succinate  150 mg Oral Q breakfast  . pantoprazole  80 mg Oral Q1200  . potassium chloride SA  20 mEq Oral BID  . pregabalin  75 mg Oral BID  . sodium chloride  3 mL Intravenous Q12H    OBJECTIVE  Filed Vitals:   03/15/14 2230 03/16/14 0123 03/16/14 0530 03/16/14 0544  BP:  133/56 127/72   Pulse: 66 62 56   Temp:  97.5 F (36.4 C) 97.5 F (36.4 C)   TempSrc:  Oral Oral   Resp: 16 17 18    Height:      Weight:    276 lb 3.2 oz (125.283 kg)  SpO2: 96% 94% 96%     Intake/Output Summary (Last 24 hours) at 03/16/14 1046 Last data filed at 03/16/14 0900  Gross per 24 hour  Intake    600 ml  Output    780 ml  Net   -180 ml   Filed Weights   03/15/14 1225 03/15/14 1951 03/16/14 0544  Weight: 260 lb (117.935 kg) 278 lb 12.8 oz (126.463 kg) 276 lb 3.2 oz (125.283 kg)    PHYSICAL EXAM  General: Pleasant, NAD. Neuro: Alert and oriented X 3. Moves all extremities spontaneously. Psych: Normal affect. HEENT:  Normal  Neck: Supple without bruits or JVD. Lungs:  Resp regular and unlabored, CTA. Heart: RRR no s3, s4, or murmurs. Abdomen: Soft, non-tender, non-distended, BS + x 4.  Extremities: No clubbing, cyanosis or edema. DP/PT/Radials  2+ and equal bilaterally.  Accessory Clinical Findings  CBC  Recent Labs  03/15/14 1228 03/16/14 0500  WBC 7.5 6.7  HGB 13.1 12.7*  HCT 39.5 39.6  MCV 83.2 83.4  PLT 261 [id number]   Basic Metabolic Panel  Recent Labs  03/15/14 1228 03/16/14 0500  NA 140 140  K 4.0 4.1  CL 102 104  CO2 23 24  GLUCOSE 101* 118*  BUN 20 20  CREATININE 1.09 1.07  CALCIUM 9.4 9.5   Cardiac Enzymes  Recent Labs  03/15/14 1714 03/15/14 2327 03/16/14 0514  TROPONINI <0.30 <0.30 <0.30    TELE NSR with HR 50-60s, frequently dip down to 30s this morning due to failed conduction of P waves    ECG  NSR with 1st degree heart block  Echocardiogram  10/12/2013 LV EF: 55% - 65%  ------------------------------------------------------------ Indications: CAD of native vessels 414.01. Murmur 785.2.  ------------------------------------------------------------ History: PMH: Acquired from the patient and from the patient's chart. Dyspnea and murmur. Coronary artery disease. Risk factors: Hypertension. Diabetes mellitus. Dyslipidemia. OSA.  ------------------------------------------------------------ Study Conclusions  - Left ventricle: The cavity size was normal. Wall thickness  was increased in a pattern of mild LVH. Systolic function was normal. The estimated ejection fraction was in the range of 55% to 65%. Wall motion was normal; there were no regional wall motion abnormalities. - Aortic valve: Valve area: 1.67cm^2(VTI). Valve area: 1.78cm^2 (Vmax). - Left atrium: The atrium was moderately dilated. - Right atrium: The atrium was moderately dilated. - Pulmonary arteries: Systolic pressure was mildly increased. PA peak pressure: 35mm Hg (S).       Radiology/Studies  Dg Chest 2 View  03/15/2014   CLINICAL DATA:  Dyspnea with exertion  EXAM: CHEST  2 VIEW  COMPARISON:  June 10, 2013  FINDINGS: There is no edema or consolidation. Heart size and pulmonary vascularity are  normal. No adenopathy. Patient is status post coronary artery bypass grafting. There is degenerative change in the thoracic spine.  IMPRESSION: No edema or consolidation.   Electronically Signed   By: Lowella Grip M.D.   On: 03/15/2014 14:00    ASSESSMENT AND PLAN  1. DOE + lightheadedness + bilateral leg weakness/unstable angina  - similar to what he experienced prior to previous CABG, states he never had CP before even before his CABG  - troponin negative overnight, continue ASA, BB, Lipitor, lasix  - plan for cardiac cath this afternoon to definitively assess coronary anatomy  - has frequently failed conducting P waves on telemetry this morning, consider decrease BB dose  2. CAD s/p CABG x 5 in 2001 by Dr. Cyndia Bent 3. HTN 4. HLD 5. OSA 6. CKD, stage II  Signed, Woodward Ku Pager: [phone]  Personally seen and examined. Agree with above. TELE with Second degree HB type 1. Will pull back on metoprolol.  Cath today. Candee Furbish, MD

## 2014-03-17 ENCOUNTER — Other Ambulatory Visit: Payer: Self-pay | Admitting: *Deleted

## 2014-03-17 ENCOUNTER — Other Ambulatory Visit: Payer: Self-pay | Admitting: Physician Assistant

## 2014-03-17 ENCOUNTER — Encounter (HOSPITAL_COMMUNITY): Payer: Self-pay | Admitting: Physician Assistant

## 2014-03-17 DIAGNOSIS — I441 Atrioventricular block, second degree: Secondary | ICD-10-CM

## 2014-03-17 DIAGNOSIS — I2510 Atherosclerotic heart disease of native coronary artery without angina pectoris: Secondary | ICD-10-CM

## 2014-03-17 DIAGNOSIS — R0989 Other specified symptoms and signs involving the circulatory and respiratory systems: Secondary | ICD-10-CM | POA: Diagnosis not present

## 2014-03-17 DIAGNOSIS — R0609 Other forms of dyspnea: Secondary | ICD-10-CM | POA: Diagnosis not present

## 2014-03-17 LAB — BASIC METABOLIC PANEL
ANION GAP: 14 (ref 5–15)
BUN: 14 mg/dL (ref 6–23)
CO2: 21 meq/L (ref 19–32)
Calcium: 9.3 mg/dL (ref 8.4–10.5)
Chloride: 104 mEq/L (ref 96–112)
Creatinine, Ser: 1.01 mg/dL (ref 0.50–1.35)
GFR calc Af Amer: 82 mL/min — ABNORMAL LOW (ref >90)
GFR calc non Af Amer: 71 mL/min — ABNORMAL LOW (ref >90)
Glucose, Bld: 159 mg/dL — ABNORMAL HIGH (ref 70–99)
Potassium: 5 mEq/L (ref 3.7–5.3)
SODIUM: 139 meq/L (ref 137–147)

## 2014-03-17 LAB — GLUCOSE, CAPILLARY
GLUCOSE-CAPILLARY: 82 mg/dL (ref 70–99)
Glucose-Capillary: 119 mg/dL — ABNORMAL HIGH (ref 70–99)

## 2014-03-17 MED ORDER — POTASSIUM CHLORIDE CRYS ER 20 MEQ PO TBCR: 20.0000 meq | EXTENDED_RELEASE_TABLET | Freq: Two times a day (BID) | ORAL | Status: DC

## 2014-03-17 MED ORDER — AMLODIPINE BESYLATE 5 MG PO TABS: 10.0000 mg | ORAL_TABLET | Freq: Every day | ORAL | Status: DC

## 2014-03-17 NOTE — Discharge Summary (Signed)
Discharge Summary   Patient ID: Richard [name],  MRN: [id number], DOB/AGE: 1940/12/29 73 y.o.  Admit date: 03/15/2014 Discharge date: 03/17/2014  Primary Care Provider: Garnet Koyanagi Primary Cardiologist: Dr. Percival Spanish  Discharge Diagnoses Principal Problem:   Lightheadedness Active Problems:   HYPERLIPIDEMIA, MIXED   HYPERTENSION, BENIGN   CAD, ARTERY BYPASS GRAFT   GERD, SEVERE   SLEEP APNEA   Anemia, iron deficiency   Type II or unspecified type diabetes mellitus with neurological manifestations, uncontrolled   DOE (dyspnea on exertion)   Wenckebach block   Allergies Allergies  Allergen Reactions  . Benazepril     angioedema; he is not a candidate for any angiotensin receptor blockers because of this significant allergic reaction. Because of a history of documented adverse serious drug reaction;Medi Alert bracelet  is recommended  . Hctz [Hydrochlorothiazide] Swelling    Tongue and lip swelling   . Aspirin Other (See Comments)    Gastritis, cant take 325 Mg aspirin     Procedures  Cardiac catheterization Hemodynamic Findings:  Central aortic pressure: 129/61  Left ventricular pressure: 138/4/20  Angiographic Findings:  Left main: 20% distal stenosis.  Left Anterior Descending Artery: Large caliber vessel with 100% mid occlusion. The mid and distal vessel fills from the patent IMA graft.  Circumflex Artery: Large caliber vessel with two obtuse marginal branches. The first OM branch has diffuse proximal 90% stenosis. The second OM branch is occluded. Both OM branches fill from the patent sequential vein graft.  Right Coronary Artery: Large dominant vessel with 99% proximal stenosis followed by 100% mid occlusion. The distal vessel fills from the patent vein graft.  Graft Anatomy:  SVG sequential to OM1/OM2 is patent  SVG to Diagonal is occluded  RIMA to RCA is patent  LIMA to mid LAD is patent  Left Ventricular Angiogram: LVEF=50-55%.  Impression:  1. Triple  vessel CAD s/p 5V CABG with 4/5 patent bypass grafts  2. Normal LV systolic function  3. Second degree AV block noted during the case  Recommendations: Continue medical management of CAD. If pt remains dizzy and dyspneic when standing and walking with second degree AV block, may need to consider EP consultation for pacemaker.      Hospital Course  The patient is a 73 year old male with past medical history of hypertension, hyperlipidemia, obstructive sleep apnea, chronic kidney disease and history of coronary artery disease status post CABG x5 in 2001 by Dr. Cyndia Bent. He has been following with Dr. Percival Spanish as outpatient. He presented to [facility] ED on 03/15/2014 with symptoms consistent with what he believed was his anginal equivalent. According to the patient, he is an avid Animator and has been noticing increasing exertional dizziness, weakness, and shortness of breath. He denies any chest discomfort or palpitation, however states he never had any history of chest pain prior to the bypass either.   Given his significant past medical history along with his symptom, cardiac catheterization was planned to definitively assess his coronary anatomy. Patient underwent cardiac catheterization on 03/16/2014 which showed 100% mid LAD chronic occlusion, with mid to distal vessel filled with patent LIMA graft, diffuse 90% stenosis in OM1, complete occlusion in OM 2, patent sequential SVG to OM1/OM 2, and 99% proximal RCA stenosis with 100% mid RCA occlusion, patent LIMA to RCA, patient does have occluded SVG to diagonal branch. It appears 4 out 5 of previous bypass grafts were still patent. Medical management has been recommended. During his hospitalization, it was noted the patient has first  degree AV block with intermittent episodes of second-degree AV block type I. His metoprolol has been held. His home amlodipine has been increased from 5 mg to 10 mg.  The patient was seen the morning of 03/17/2014, at which  time he denies any significant chest discomfort or shortness breath, he is deemed stable for discharge from cardiology perspective. He'll need a followup in one week with EKG to assess for further symptom. I have advised the patient to hold his metformin for at least 48 hours after cardiac catheterization, he can restart his metformin on 03/19/2014. If this patient has further symptomatic episodes, may consider cardiac monitor as outpatient to assess for potential pacemaker in the future (assume he has symptomatic bradycardia 2/2 Wenkebach).    Discharge Vitals Blood pressure 155/87, pulse 99, temperature 98.1 F (36.7 C), temperature source Oral, resp. rate 20, height 6' 0.5" (1.842 m), weight 275 lb 10.3 oz (125.03 kg), SpO2 96.00%.  Filed Weights   03/15/14 1951 03/16/14 0544 03/17/14 0512  Weight: 278 lb 12.8 oz (126.463 kg) 276 lb 3.2 oz (125.283 kg) 275 lb 10.3 oz (125.03 kg)    Labs  CBC  Recent Labs  03/15/14 1228 03/16/14 0500  WBC 7.5 6.7  HGB 13.1 12.7*  HCT 39.5 39.6  MCV 83.2 83.4  PLT 261 [id number]   Basic Metabolic Panel  Recent Labs  03/16/14 0500 03/17/14 0926  NA 140 139  K 4.1 5.0  CL 104 104  CO2 24 21  GLUCOSE 118* 159*  BUN 20 14  CREATININE 1.07 1.01  CALCIUM 9.5 9.3   Cardiac Enzymes  Recent Labs  03/15/14 1714 03/15/14 2327 03/16/14 0514  TROPONINI <0.30 <0.30 <0.30   Disposition  Pt is being discharged home today in good condition.  Follow-up Plans & Appointments      Follow-up Information   Follow up with Lyda Jester, PA-C On 04/04/2014. (2pm. Obtain EKG during follow up as well)    Specialty:  Cardiology   Contact information:   [address] [place]  [address] [phone]       Discharge Medications    Medication List    STOP taking these medications       metoprolol succinate 100 MG 24 hr tablet  Commonly known as:  TOPROL-XL      TAKE these medications       albuterol 108 (90 BASE)  MCG/ACT inhaler  Commonly known as:  PROVENTIL HFA;VENTOLIN HFA  Inhale 2 puffs into the lungs every 6 (six) hours as needed for wheezing or shortness of breath.     allopurinol 300 MG tablet  Commonly known as:  ZYLOPRIM  Take 450 mg by mouth daily.     amLODipine 5 MG tablet  Commonly known as:  NORVASC  Take 2 tablets (10 mg total) by mouth daily.     aspirin 81 MG tablet  Take 81 mg by mouth daily.     atorvastatin 80 MG tablet  Commonly known as:  LIPITOR  TAKE ONE TABLET BY MOUTH AT BEDTIME     esomeprazole 40 MG capsule  Commonly known as:  NEXIUM  Take 1 capsule (40 mg total) by mouth daily.     fenofibrate 160 MG tablet  TAKE ONE TABLET BY MOUTH ONE TIME DAILY     fexofenadine 180 MG tablet  Commonly known as:  ALLEGRA  Take 90 mg by mouth 2 (two) times daily.     fluticasone 50 MCG/ACT nasal spray  Commonly known  as:  FLONASE  Place 1 spray into both nostrils 2 (two) times daily as needed for allergies or rhinitis.     furosemide 40 MG tablet  Commonly known as:  LASIX  TAKE ONE TABLET BY MOUTH TWICE DAILY     glimepiride 2 MG tablet  Commonly known as:  AMARYL  Take 1 mg by mouth daily before breakfast.     glucose blood test strip  One touch Verio test strips Check blood sugar once daily Dx 250.00     metFORMIN 1000 MG tablet  Commonly known as:  GLUCOPHAGE  TAKE ONE TABLET BY MOUTH TWICE DAILY WITH MEALS     nitroGLYCERIN 0.4 MG SL tablet  Commonly known as:  NITROSTAT  Place 0.4 mg under the tongue every 5 (five) minutes as needed for chest pain.     ONETOUCH DELICA LANCETS FINE Misc  Check blood sugar once daily Dx 250.00     potassium chloride SA 20 MEQ tablet  Commonly known as:  K-DUR,KLOR-CON  Take 1 tablet (20 mEq total) by mouth 2 (two) times daily.     pregabalin 75 MG capsule  Commonly known as:  LYRICA  Take 75 mg by mouth 2 (two) times daily.     psyllium 95 % Pack  Commonly known as:  HYDROCIL/METAMUCIL  Take 1 packet by  mouth 2 (two) times daily.     vitamin C 500 MG tablet  Commonly known as:  ASCORBIC ACID  Take 500 mg by mouth every morning.     ZETIA 10 MG tablet  Generic drug:  ezetimibe  Take one tablet by mouth one time daily        Outstanding Labs/Studies  EKG on follow up  Duration of Discharge Encounter   Greater than 30 minutes including physician time.  Hilbert Corrigan PA-C Pager: [phone] 03/17/2014, 12:03 PM   Personally seen and examined. Agree with above. Will hold beta blocker.  May be able to reinitiate in outpatient setting.  Tele overnight transiently HR 32 overnight.  With CAD, would be beneficial. However concerned that "dizziness" may be secondary to bradycardia.  Watch for signs of rebound tachycardia.  If dizziness returns, may need event monitor.  Second degree type 1 HB alone is not a reason for pacer.  Will have close follow up in one week with ECG as outpatient.   Candee Furbish, MD

## 2014-03-17 NOTE — Progress Notes (Signed)
Co-signed for Sabrina Lewis RN/BSN for assessment, IV assessment, medication administration, care plan, patient education, I's & O's, progress notes, and vital signs. Odalis Jordan M, RN/BSN  

## 2014-03-17 NOTE — Progress Notes (Signed)
Patient Name: Richard [name] Date of Encounter: 03/17/2014  Primary cardiology: DR. Percival Spanish.    Active Problems:   DOE (dyspnea on exertion)   Lightheadedness   Intermediate coronary syndrome    SUBJECTIVE  Felt good last night, have not ambulated this morning. Denies any CP or SOB.  CURRENT MEDS . allopurinol  450 mg Oral Daily  . amLODipine  5 mg Oral Daily  . aspirin EC  81 mg Oral Daily  . atorvastatin  80 mg Oral q1800  . ezetimibe  10 mg Oral Daily  . fenofibrate  54 mg Oral Daily  . fluticasone  2 spray Each Nare Daily  . furosemide  40 mg Oral Daily  . glimepiride  1 mg Oral QAC breakfast  . pantoprazole  80 mg Oral Q1200  . potassium chloride SA  20 mEq Oral BID  . pregabalin  75 mg Oral BID    OBJECTIVE  Filed Vitals:   03/16/14 2225 03/17/14 0205 03/17/14 0512 03/17/14 0748  BP:  116/59 115/60 155/87  Pulse: 60 65 88 99  Temp:  98.3 F (36.8 C) 98 F (36.7 C) 98.1 F (36.7 C)  TempSrc:  Oral Oral   Resp: 20 20 20    Height:      Weight:   275 lb 10.3 oz (125.03 kg)   SpO2:  95% 96%     Intake/Output Summary (Last 24 hours) at 03/17/14 0814 Last data filed at 03/17/14 0806  Gross per 24 hour  Intake    540 ml  Output   2025 ml  Net  -1485 ml   Filed Weights   03/15/14 1951 03/16/14 0544 03/17/14 0512  Weight: 278 lb 12.8 oz (126.463 kg) 276 lb 3.2 oz (125.283 kg) 275 lb 10.3 oz (125.03 kg)    PHYSICAL EXAM  General: Pleasant, NAD. Neuro: Alert and oriented X 3. Moves all extremities spontaneously. Psych: Normal affect. HEENT:  Normal  Neck: Supple without bruits or JVD. Lungs:  Resp regular and unlabored, CTA. Heart: RRR no s3, s4. 1/6 systolic murmur Abdomen: Soft, non-tender, non-distended, BS + x 4. R groin cath site stable, no bleeding Extremities: No clubbing, cyanosis. DP/PT/Radials 2+ and equal bilaterally. 1+ edema  Accessory Clinical Findings  CBC  Recent Labs  03/15/14 1228 03/16/14 0500  WBC 7.5 6.7  HGB 13.1  12.7*  HCT 39.5 39.6  MCV 83.2 83.4  PLT 261 [id number]   Basic Metabolic Panel  Recent Labs  03/15/14 1228 03/16/14 0500  NA 140 140  K 4.0 4.1  CL 102 104  CO2 23 24  GLUCOSE 101* 118*  BUN 20 20  CREATININE 1.09 1.07  CALCIUM 9.4 9.5   Liver Function Tests No results found for this basename: AST, ALT, ALKPHOS, BILITOT, PROT, ALBUMIN,  in the last 72 hours No results found for this basename: LIPASE, AMYLASE,  in the last 72 hours Cardiac Enzymes  Recent Labs  03/15/14 1714 03/15/14 2327 03/16/14 0514  TROPONINI <0.30 <0.30 <0.30    TELE NSR with frequent Wenckebach block with dropped P wave    ECG  03/15/2014 NSR with 1st degree heart block  Echocardiogram 10/02/2013  LV EF: 55% - 65%  ------------------------------------------------------------ Indications: CAD of native vessels 414.01. Murmur 785.2.  ------------------------------------------------------------ History: PMH: Acquired from the patient and from the patient's chart. Dyspnea and murmur. Coronary artery disease. Risk factors: Hypertension. Diabetes mellitus. Dyslipidemia. OSA.  ------------------------------------------------------------ Study Conclusions  - Left ventricle: The cavity size was normal. Wall thickness was  increased in a pattern of mild LVH. Systolic function was normal. The estimated ejection fraction was in the range of 55% to 65%. Wall motion was normal; there were no regional wall motion abnormalities. - Aortic valve: Valve area: 1.67cm^2(VTI). Valve area: 1.78cm^2 (Vmax). - Left atrium: The atrium was moderately dilated. - Right atrium: The atrium was moderately dilated. - Pulmonary arteries: Systolic pressure was mildly increased. PA peak pressure: 53mm Hg (S).      Radiology/Studies  Dg Chest 2 View  03/15/2014   CLINICAL DATA:  Dyspnea with exertion  EXAM: CHEST  2 VIEW  COMPARISON:  June 10, 2013  FINDINGS: There is no edema or consolidation. Heart size and  pulmonary vascularity are normal. No adenopathy. Patient is status post coronary artery bypass grafting. There is degenerative change in the thoracic spine.  IMPRESSION: No edema or consolidation.   Electronically Signed   By: Lowella Grip M.D.   On: 03/15/2014 14:00    ASSESSMENT AND PLAN  1. DOE + lightheadedness + bilateral leg weakness  - similar to what he experienced prior to previous CABG, states he never had CP before even before his CABG  - Cath 03/16/2014 100% mid LAD, mid to distal vessel fill from patent IMA graft, SVG to diag occluded, diffuse 90% stenosis in OM1, OM2 occluded, patent sequential SVG to OM1/OM2, 99% prox RCA stenosis with 100% mid RCA occlusion, patent RIMA to RCA  - medical management  2. Second degree AV block type 1  - BB decreased from 150 to 100mg  yesterday, discontinued last night for unclear reason?  - consider event monitor +/- low dose BB on discharge, if continue to have symptomatic episodes, maybe a candidate for pacemaker (if no EP consult during this admission, likely discharge today) - nurse to ambulate patient this morning  3. CAD s/p CABG x 5 in 2001 by Dr. Cyndia Bent   - Cath 03/16/2014 SVG sequential to OM1/OM2 is patent, RIMA to RCA is patent, LIMA to mid LAD is patent, SVG to Diagonal is occluded    4. HTN  5. HLD  6. OSA  7. CKD, stage II  - pending morning BMET   Signed, Almyra Deforest PA-C Pager: [phone]  Personally seen and examined. Agree with above. Will hold beta blocker. May be able to reinitiate in outpatient setting.  Tele overnight HR 32.  With CAD, would be beneficial. However concerned that "dizziness" may be secondary to bradycardia.  Watch for signs of rebound tachycardia.  If dizziness returns, may need event monitor. Second degree type 1 HB alone is not a reason for pacer.  Will have close follow up in one week with ECG as outpatient.   Candee Furbish, MD

## 2014-03-17 NOTE — Progress Notes (Signed)
DC IV.  DC tele.  DC pt.  Reviewed discharge instructions and medications with pt.  Per physician order, he is to hold taking Metformin and Glimepiride until 03/19/14, so as not to interact with any contrast from catherization.  Pt had no further questions, and was in no distress.  Pt left via wheelchair.

## 2014-03-18 ENCOUNTER — Ambulatory Visit (INDEPENDENT_AMBULATORY_CARE_PROVIDER_SITE_OTHER): Payer: Medicare Other | Admitting: Family Medicine

## 2014-03-18 ENCOUNTER — Encounter: Payer: Self-pay | Admitting: Family Medicine

## 2014-03-18 VITALS — BP 128/72 | HR 69 | Temp 98.6°F | Wt 281.7 lb

## 2014-03-18 DIAGNOSIS — I200 Unstable angina: Secondary | ICD-10-CM

## 2014-03-18 DIAGNOSIS — E1149 Type 2 diabetes mellitus with other diabetic neurological complication: Secondary | ICD-10-CM | POA: Diagnosis not present

## 2014-03-18 DIAGNOSIS — R0609 Other forms of dyspnea: Secondary | ICD-10-CM | POA: Diagnosis not present

## 2014-03-18 DIAGNOSIS — I10 Essential (primary) hypertension: Secondary | ICD-10-CM | POA: Diagnosis not present

## 2014-03-18 DIAGNOSIS — R0989 Other specified symptoms and signs involving the circulatory and respiratory systems: Secondary | ICD-10-CM

## 2014-03-18 DIAGNOSIS — E785 Hyperlipidemia, unspecified: Secondary | ICD-10-CM | POA: Diagnosis not present

## 2014-03-18 LAB — HEPATIC FUNCTION PANEL
ALBUMIN: 4 g/dL (ref 3.5–5.2)
ALT: 27 U/L (ref 0–53)
AST: 34 U/L (ref 0–37)
Alkaline Phosphatase: 79 U/L (ref 39–117)
BILIRUBIN TOTAL: 0.4 mg/dL (ref 0.2–1.2)
Bilirubin, Direct: 0.1 mg/dL (ref 0.0–0.3)
Total Protein: 7.3 g/dL (ref 6.0–8.3)

## 2014-03-18 LAB — HEMOGLOBIN A1C: HEMOGLOBIN A1C: 7.2 % — AB (ref 4.6–6.5)

## 2014-03-18 LAB — BASIC METABOLIC PANEL
BUN: 18 mg/dL (ref 6–23)
CALCIUM: 9 mg/dL (ref 8.4–10.5)
CHLORIDE: 106 meq/L (ref 96–112)
CO2: 21 mEq/L (ref 19–32)
Creatinine, Ser: 1.2 mg/dL (ref 0.4–1.5)
GFR: 62.03 mL/min (ref >60.00)
Glucose, Bld: 131 mg/dL — ABNORMAL HIGH (ref 70–99)
Potassium: 4.2 mEq/L (ref 3.5–5.1)
Sodium: 138 mEq/L (ref 135–145)

## 2014-03-18 LAB — POCT URINALYSIS DIPSTICK
Bilirubin, UA: NEGATIVE
Blood, UA: NEGATIVE
GLUCOSE UA: NEGATIVE
KETONES UA: NEGATIVE
Leukocytes, UA: NEGATIVE
Nitrite, UA: NEGATIVE
Protein, UA: NEGATIVE
Spec Grav, UA: >=1.03
UROBILINOGEN UA: 0.2
pH, UA: 6

## 2014-03-18 LAB — LIPID PANEL
CHOL/HDL RATIO: 6
CHOLESTEROL: 150 mg/dL (ref 0–200)
HDL: 25.9 mg/dL — AB (ref >39.00)
NonHDL: 124.1
TRIGLYCERIDES: 204 mg/dL — AB (ref 0.0–149.0)
VLDL: 40.8 mg/dL — AB (ref 0.0–40.0)

## 2014-03-18 LAB — MICROALBUMIN / CREATININE URINE RATIO
CREATININE, U: 90.1 mg/dL
Microalb Creat Ratio: 0.1 mg/g (ref 0.0–30.0)
Microalb, Ur: 0.1 mg/dL (ref 0.0–1.9)

## 2014-03-18 LAB — LDL CHOLESTEROL, DIRECT: LDL DIRECT: 99.6 mg/dL

## 2014-03-18 NOTE — Progress Notes (Signed)
   Subjective:    Patient ID: Richard [name], male    DOB: 11/06/1940, 73 y.o.   MRN: [id number]  HPI Pt here f/u hospital for DOE and dizziness.  [place] got very weak and dizzy. Pt was admitted and cardiac cath was done.---  Triple vessel CAD Pt is feeling much better than he did and has had no more episodes.  Cardiology appointment is next week.     Review of Systems As above    Objective:   Physical Exam BP 128/72  Pulse 69  Temp(Src) 98.6 F (37 C) (Oral)  Wt 281 lb 12 oz (127.8 kg)  SpO2 93% General appearance: alert, cooperative, appears stated age and no distress Neck: no adenopathy, supple, symmetrical, trachea midline and thyroid not enlarged, symmetric, no tenderness/mass/nodules Lungs: clear to auscultation bilaterally Heart: S1, S2 normal--+  murmur Extremities: extremities normal, atraumatic, no cyanosis or edema        Assessment & Plan:  1. Type II or unspecified type diabetes mellitus with neurological manifestations, not stated as uncontrolled Check labs con't meds - Basic metabolic panel - Hemoglobin A1c - Hepatic function panel - Lipid panel - Microalbumin / creatinine urine ratio - POCT urinalysis dipstick  2. Essential hypertension Stable, con't meds - Basic metabolic panel - Hemoglobin A1c - Hepatic function panel - Lipid panel - Microalbumin / creatinine urine ratio - POCT urinalysis dipstick  3. Other and unspecified hyperlipidemia Check labs - Basic metabolic panel - Hemoglobin A1c - Hepatic function panel - Lipid panel - Microalbumin / creatinine urine ratio - POCT urinalysis dipstick

## 2014-03-18 NOTE — Patient Instructions (Signed)
Diabetes and Standards of Medical Care Diabetes is complicated. You may find that your diabetes team includes a dietitian, nurse, diabetes educator, eye doctor, and more. To help everyone know what is going on and to help you get the care you deserve, the following schedule of care was developed to help keep you on track. Below are the tests, exams, vaccines, medicines, education, and plans you will need. HbA1c test This test shows how well you have controlled your glucose over the past 2-3 months. It is used to see if your diabetes management plan needs to be adjusted.   It is performed at least 2 times a year if you are meeting treatment goals.  It is performed 4 times a year if therapy has changed or if you are not meeting treatment goals. Blood pressure test  This test is performed at every routine medical visit. The goal is less than 140/90 mm Hg for most people, but 130/80 mm Hg in some cases. Ask your health care provider about your goal. Dental exam  Follow up with the dentist regularly. Eye exam  If you are diagnosed with type 1 diabetes as a child, get an exam upon reaching the age of 37 years or older and have had diabetes for 3-5 years. Yearly eye exams are recommended after that initial eye exam.  If you are diagnosed with type 1 diabetes as an adult, get an exam within 5 years of diagnosis and then yearly.  If you are diagnosed with type 2 diabetes, get an exam as soon as possible after the diagnosis and then yearly. Foot care exam  Visual foot exams are performed at every routine medical visit. The exams check for cuts, injuries, or other problems with the feet.  A comprehensive foot exam should be done yearly. This includes visual inspection as well as assessing foot pulses and testing for loss of sensation.  Check your feet nightly for cuts, injuries, or other problems with your feet. Tell your health care provider if anything is not healing. Kidney function test (urine  microalbumin)  This test is performed once a year.  Type 1 diabetes: The first test is performed 5 years after diagnosis.  Type 2 diabetes: The first test is performed at the time of diagnosis.  A serum creatinine and estimated glomerular filtration rate (eGFR) test is done once a year to assess the level of chronic kidney disease (CKD), if present. Lipid profile (cholesterol, HDL, LDL, triglycerides)  Performed every 5 years for most people.  The goal for LDL is less than 100 mg/dL. If you are at high risk, the goal is less than 70 mg/dL.  The goal for HDL is 40 mg/dL-50 mg/dL for men and 50 mg/dL-60 mg/dL for women. An HDL cholesterol of 60 mg/dL or higher gives some protection against heart disease.  The goal for triglycerides is less than 150 mg/dL. Influenza vaccine, pneumococcal vaccine, and hepatitis B vaccine  The influenza vaccine is recommended yearly.  It is recommended that people with diabetes who are over 24 years old get the pneumonia vaccine. In some cases, two separate shots may be given. Ask your health care provider if your pneumonia vaccination is up to date.  The hepatitis B vaccine is also recommended for adults with diabetes. Diabetes self-management education  Education is recommended at diagnosis and ongoing as needed. Treatment plan  Your treatment plan is reviewed at every medical visit. Document Released: 04/14/2009 Document Revised: 11/01/2013 Document Reviewed: 11/17/2012 [facility] Patient Information 2015 [place],  [employer]. This information is not intended to replace advice given to you by your health care provider. Make sure you discuss any questions you have with your health care provider.  

## 2014-03-18 NOTE — Assessment & Plan Note (Signed)
Hospital d/c summary reviewed Pt still feeling a little dizzy but much better Ov with cards next week

## 2014-03-18 NOTE — Assessment & Plan Note (Signed)
con't meds 

## 2014-03-18 NOTE — Progress Notes (Signed)
Pre visit review using our clinic review tool, if applicable. No additional management support is needed unless otherwise documented below in the visit note. 

## 2014-03-19 NOTE — ED Provider Notes (Signed)
Medical screening examination/treatment/procedure(s) were conducted as a shared visit with non-physician practitioner(s) and myself.  I personally evaluated the patient during the encounter.   EKG Interpretation   Date/Time:  Tuesday March 15 2014 15:48:35 EDT Ventricular Rate:  64 PR Interval:  279 QRS Duration: 96 QT Interval:  411 QTC Calculation: 424 R Axis:   35 Text Interpretation:  Sinus rhythm Prolonged PR interval Anterior infarct,  old ED PHYSICIAN INTERPRETATION AVAILABLE IN [facility] [place] Confirmed by  TEST, Record ([id number]) on 03/17/2014 6:53:00 AM       Given patient's prior history of dyspnea like this that was related to CAD, will consult cardiology. Asymptomatic at this time.   Ephraim Hamburger, MD 03/19/14 [phone]

## 2014-03-29 ENCOUNTER — Ambulatory Visit (HOSPITAL_BASED_OUTPATIENT_CLINIC_OR_DEPARTMENT_OTHER)
Admission: RE | Admit: 2014-03-29 | Discharge: 2014-03-29 | Disposition: A | Discharge status: Home / Self Care | Payer: Medicare Other | Source: Ambulatory Visit | Attending: Medical | Admitting: Medical

## 2014-03-29 ENCOUNTER — Ambulatory Visit (INDEPENDENT_AMBULATORY_CARE_PROVIDER_SITE_OTHER): Payer: Medicare Other | Admitting: Medical

## 2014-03-29 ENCOUNTER — Encounter: Payer: Self-pay | Admitting: Medical

## 2014-03-29 VITALS — BP 135/70 | Temp 98.3°F | Ht 72.75 in | Wt 279.4 lb

## 2014-03-29 DIAGNOSIS — R059 Cough, unspecified: Secondary | ICD-10-CM | POA: Insufficient documentation

## 2014-03-29 DIAGNOSIS — J309 Allergic rhinitis, unspecified: Secondary | ICD-10-CM | POA: Diagnosis not present

## 2014-03-29 DIAGNOSIS — R05 Cough: Secondary | ICD-10-CM

## 2014-03-29 DIAGNOSIS — M940 Chondrocostal junction syndrome [Tietze]: Secondary | ICD-10-CM

## 2014-03-29 DIAGNOSIS — J209 Acute bronchitis, unspecified: Secondary | ICD-10-CM | POA: Diagnosis not present

## 2014-03-29 DIAGNOSIS — IMO0002 Reserved for concepts with insufficient information to code with codable children: Secondary | ICD-10-CM | POA: Diagnosis not present

## 2014-03-29 DIAGNOSIS — Z951 Presence of aortocoronary bypass graft: Secondary | ICD-10-CM | POA: Diagnosis not present

## 2014-03-29 DIAGNOSIS — I2510 Atherosclerotic heart disease of native coronary artery without angina pectoris: Secondary | ICD-10-CM | POA: Insufficient documentation

## 2014-03-29 DIAGNOSIS — J984 Other disorders of lung: Secondary | ICD-10-CM | POA: Diagnosis not present

## 2014-03-29 DIAGNOSIS — I200 Unstable angina: Secondary | ICD-10-CM

## 2014-03-29 DIAGNOSIS — R0989 Other specified symptoms and signs involving the circulatory and respiratory systems: Secondary | ICD-10-CM | POA: Diagnosis not present

## 2014-03-29 MED ORDER — BENZONATATE 100 MG PO CAPS: 100.0000 mg | ORAL_CAPSULE | Freq: Three times a day (TID) | ORAL | Status: DC | PRN

## 2014-03-29 MED ORDER — AZITHROMYCIN 250 MG PO TABS: ORAL_TABLET | ORAL | Status: DC

## 2014-03-29 NOTE — Assessment & Plan Note (Signed)
Some allergic rhinitis. Continue nasal steroid and allegra.

## 2014-03-29 NOTE — Assessment & Plan Note (Addendum)
Pt pain is very easily reproducible to palpation over his pectoralis rt side and his costochondral junction rt side. Pain also present when coughs. If no palpation of chest wall, no cough and not deep inspiration he has no pain. Pain is described as superficial and not deep.  Did discuss with pt he could take low dose ibuprofen 400 mg po tid. Also advised pt if featurs worsen or change then ED evaluation. He agreed. Note no leg pain. Negative homans sign.

## 2014-03-29 NOTE — Assessment & Plan Note (Addendum)
Rx of azithromycin. Benzonatate rx  for cough.

## 2014-03-29 NOTE — Progress Notes (Signed)
   Subjective:    Patient ID: Richard [name], male    DOB: 04/12/1941, 73 y.o.   MRN: [id number]  HPI  Pt in states on Saturday morning he has some chest congestion. He is coughing up some mucous in the morning. Pt states severe coughing episodes.  Some rt upper chest pain on deep breathing and on palpation. No fever or chills. Faint sinus congestion.Some itching eyes. Some sweating last night. No sob or wheezing.   2 weeks [place]iness [place].None presently. They evaluated his stents and they were all open. His pulse was low. Taken off of his b blocker and now pulse is up. No cardiac type chest pain(costochonritis type pain only). No palpitations.    Review of Systems  Constitutional: Negative for fever, chills and fatigue.  HENT: Positive for congestion. Negative for ear pain, rhinorrhea, sinus pressure, sore throat and trouble swallowing.   Eyes: Positive for itching.       Some watering  Respiratory: Positive for cough. Negative for chest tightness, shortness of breath and wheezing.   Cardiovascular: Negative for chest pain and palpitations.  Gastrointestinal: Negative.   Genitourinary: Negative.   Musculoskeletal: Negative.   Skin: Negative.   Neurological: Negative.  Negative for dizziness, syncope, speech difficulty, weakness, light-headedness, numbness and headaches.  Hematological: Positive for adenopathy. Does not bruise/bleed easily.       Over weekend mild submandibular node tender.       Objective:   Physical Exam  General  Mental Status - Alert. General Appearance - Well groomed. Not in acute distress.  Skin Rashes- No Rashes.  HEENT Head- Normal. Ear Auditory Canal - Left- Normal. Right - Normal.Tympanic Membrane- Left- Normal. Right- Normal. Eye Sclera/Conjunctiva- Left- Normal. Right- Normal. Nose & Sinuses Nasal Mucosa- Left-  Boggy + Congested. Right-  Boggy + Congested. Mouth & Throat Lips: Upper Lip- Normal: no dryness, cracking, pallor,  cyanosis, or vesicular eruption. Lower Lip-Normal: no dryness, cracking, pallor, cyanosis or vesicular eruption. Buccal Mucosa- Bilateral- No Aphthous ulcers. Oropharynx- No Discharge or Erythema. Tonsils: Characteristics- Bilateral- faint Erythema and faint Congestion. Size/Enlargement- Bilateral- No enlargement. Discharge- bilateral-None.  Neck Neck- Supple. No Masses. From. No tracheal deviation. Mild submandibular nodes faint tender. Shoddy tenderness.    Chest and Lung Exam Auscultation: Breath Sounds:-clear, even and unlabored.  Cardiovascular Auscultaion-RRR, mumur heard on exam. Not new. Old. Murmurs & Other Heart Sounds:Ausculatation of the heart reveal- No Murmurs.  Lymphatic Head & Neck General Head & Neck Lymphatics: Bilateral: Description- No Localized lymphadenopathy.  Lower extremity- Negative homans signs. Calfs symmetric.  Anterior thorax- Chest wall is directly tender to palpaton. Rt side pectoral pain reproducible on direct palpation. Faint rt costochandral tenderness.  Neurologic- CN III-XII grossly intact.        Assessment & Plan:

## 2014-03-29 NOTE — Patient Instructions (Signed)
For you allergic rhinitis, I want you to continue allegra and your nasal spray.   You may have bronchitis based on recent severe cough and sweating last night. I will give you antibiotic azithromycin and benzonatate for cough. But do want you to get cxr today. R/O pneumonia   Your have right side chest wall pain on palpation and deep inspiration. Easily reproducible. If you get any chest pain without palpation or deep inspiration the UC or ED evaluation.  Follow up in 7 days or as needed.

## 2014-03-30 ENCOUNTER — Other Ambulatory Visit: Payer: Self-pay

## 2014-03-30 MED ORDER — FENOFIBRATE 160 MG PO TABS: ORAL_TABLET | ORAL | Status: DC

## 2014-03-30 MED ORDER — FLUTICASONE PROPIONATE 50 MCG/ACT NA SUSP: 1.0000 | Freq: Two times a day (BID) | NASAL | Status: DC | PRN

## 2014-03-30 MED ORDER — METFORMIN HCL 1000 MG PO TABS: ORAL_TABLET | ORAL | Status: DC

## 2014-04-01 ENCOUNTER — Ambulatory Visit: Payer: Medicare Other | Admitting: Physician Assistant

## 2014-04-04 ENCOUNTER — Ambulatory Visit: Payer: Medicare Other | Admitting: Cardiology

## 2014-04-04 ENCOUNTER — Ambulatory Visit (INDEPENDENT_AMBULATORY_CARE_PROVIDER_SITE_OTHER): Payer: Medicare Other | Admitting: Cardiology

## 2014-04-04 ENCOUNTER — Encounter: Payer: Self-pay | Admitting: Cardiology

## 2014-04-04 VITALS — BP 133/71 | HR 91 | Ht 72.0 in | Wt 278.5 lb

## 2014-04-04 DIAGNOSIS — E782 Mixed hyperlipidemia: Secondary | ICD-10-CM

## 2014-04-04 DIAGNOSIS — I441 Atrioventricular block, second degree: Secondary | ICD-10-CM | POA: Diagnosis not present

## 2014-04-04 DIAGNOSIS — G4730 Sleep apnea, unspecified: Secondary | ICD-10-CM

## 2014-04-04 DIAGNOSIS — I200 Unstable angina: Secondary | ICD-10-CM | POA: Diagnosis not present

## 2014-04-04 DIAGNOSIS — I25810 Atherosclerosis of coronary artery bypass graft(s) without angina pectoris: Secondary | ICD-10-CM

## 2014-04-04 MED ORDER — METOPROLOL TARTRATE 25 MG PO TABS: 12.5000 mg | ORAL_TABLET | Freq: Two times a day (BID) | ORAL | Status: DC

## 2014-04-04 NOTE — Patient Instructions (Addendum)
Your physician has recommended you make the following change in your medication: start new prescription for metoprolol ( lopressor) as directed on the bottle. This has already been sent to the pharmacy.  Keep your appointment with Dr. Percival Spanish in November. If you notice that you are feeling better one week prior to that appointment, you can reschedule this appointment for January.

## 2014-04-04 NOTE — Progress Notes (Signed)
04/06/2014   PCP: Garnet Koyanagi, DO   Chief Complaint  Patient presents with  . Follow-up    post hospital.  pt denies chest pain, sob, and swelling    Primary Cardiologist:Dr. Vita Barley    HPI: 73 year old male with past medical history of hypertension, hyperlipidemia, obstructive sleep apnea, chronic kidney disease and history of coronary artery disease status post CABG x5 in 2001 by Dr. Cyndia Bent is here for post hospital follow up.    He was admitted for anginal equivalent.  Patient underwent cardiac catheterization on 03/16/2014 which showed 100% mid LAD chronic occlusion, with mid to distal vessel filled with patent LIMA graft, diffuse 90% stenosis in OM1, complete occlusion in OM 2, patent sequential SVG to OM1/OM 2, and 99% proximal RCA stenosis with 100% mid RCA occlusion, patent LIMA to RCA, patient does have occluded SVG to diagonal branch. It appears 4 out 5 of previous bypass grafts were still patent. Medical management has been recommended.  During hospitalization he had episodes of second degree AV block.  His BB was stopped.  His symptoms were weakness.  Today his HR is 91.   He denies chest pain or SOB.  No further weakness.    Allergies  Allergen Reactions  . Benazepril     angioedema; he is not a candidate for any angiotensin receptor blockers because of this significant allergic reaction. Because of a history of documented adverse serious drug reaction;Medi Alert bracelet  is recommended  . Hctz [Hydrochlorothiazide] Swelling    Tongue and lip swelling   . Aspirin Other (See Comments)    Gastritis, cant take 325 Mg aspirin     Current Outpatient Prescriptions  Medication Sig Dispense Refill  . allopurinol (ZYLOPRIM) 300 MG tablet Take 450 mg by mouth daily.       [name] amLODipine (NORVASC) 5 MG tablet Take 2 tablets (10 mg total) by mouth daily.  60 tablet  6  . Ascorbic Acid (VITAMIN C) 500 MG tablet Take 500 mg by mouth every morning.       [name]  aspirin 81 MG tablet Take 81 mg by mouth daily.      [name] atorvastatin (LIPITOR) 80 MG tablet TAKE ONE TABLET BY MOUTH AT BEDTIME   90 tablet  2  . esomeprazole (NEXIUM) 40 MG capsule Take 1 capsule (40 mg total) by mouth daily.  30 capsule  5  . fenofibrate 160 MG tablet TAKE ONE TABLET BY MOUTH ONE TIME DAILY  30 tablet  2  . fexofenadine (ALLEGRA) 180 MG tablet Take 90 mg by mouth 2 (two) times daily.       . fluticasone (FLONASE) 50 MCG/ACT nasal spray Place 1 spray into both nostrils 2 (two) times daily as needed for allergies or rhinitis.  16 g  2  . furosemide (LASIX) 40 MG tablet TAKE ONE TABLET BY MOUTH TWICE DAILY   180 tablet  2  . glimepiride (AMARYL) 2 MG tablet Take 1 mg by mouth daily before breakfast.      . glucose blood test strip One touch Verio test strips Check blood sugar once daily Dx 250.00  100 each  11  . metFORMIN (GLUCOPHAGE) 1000 MG tablet TAKE ONE TABLET BY MOUTH TWICE DAILY WITH MEALS  60 tablet  2  . nitroGLYCERIN (NITROSTAT) 0.4 MG SL tablet Place 0.4 mg under the tongue every 5 (five) minutes as needed for chest pain.      Glory Rosebush  DELICA LANCETS FINE MISC Check blood sugar once daily Dx 250.00  100 each  11  . potassium chloride SA (K-DUR,KLOR-CON) 20 MEQ tablet Take 1 tablet (20 mEq total) by mouth 2 (two) times daily.  60 tablet  1  . pregabalin (LYRICA) 75 MG capsule Take 75 mg by mouth 2 (two) times daily.      . psyllium (HYDROCIL/METAMUCIL) 95 % PACK Take 1 packet by mouth 2 (two) times daily.      [name] ZETIA 10 MG tablet Take one tablet by mouth one time daily  30 tablet  6  . metoprolol tartrate (LOPRESSOR) 25 MG tablet Take 0.5 tablets (12.5 mg total) by mouth 2 (two) times daily.  30 tablet  6   No current facility-administered medications for this visit.    Past Medical History  Diagnosis Date  . Gout   . Esophageal stricture     X 4  . Mixed hyperlipidemia   . Peptic stricture of esophagus   . Antritis (stomach)     mild  . Duodenitis      chronic  . Diverticulosis     mild, left colon  . Anemia   . HTN (hypertension)   . Sliding hiatal hernia   . Allergy   . GERD (gastroesophageal reflux disease)   . CAD (coronary artery disease)     followed by Dr. Verl Blalock  . HNP (herniated nucleus pulposus), lumbar   . Nocturia   . Adenomatous colon polyp     tubular  . Vitamin B12 deficiency   . Spinal stenosis   . Skin cancer     "right neck cut off; several frozen off my arms" (03/15/2014)  . Heart murmur   . On home oxygen therapy     "2L q hs w/CPAP" (03/15/2014)  . OSA on CPAP     with 2L O2 at night  . Type II diabetes mellitus   . Migraines     "til age 41 yrs"  . Arthritis     "knees, hips, shoulders" (03/15/2014)  . History of gout   . Chronic lower back pain   . Renal insufficiency   . Wenckebach block     a. brady down to 30s due to frequent block, metoprolol held in response.    Past Surgical History  Procedure Laterality Date  . Vasectomy    . Coronary artery bypass graft  03/2000    "CABG X5"  . Esophageal dilation  X 3-4    Dr. Lyla Son; "last one was in the 1990's"  . Esophagogastroduodenoscopy      multiple  . Flexible sigmoidoscopy      multiple  . Panendoscopy    . Knee arthroscopy Left 2011    meniscus repair  . Shoulder surgery Right 08/2010    screws placed; "tendons tore off"  . Colonoscopy w/ biopsies and polypectomy  2013  . Upper gi endoscopy  2013    Gastritis; Dr Carlean Purl  . Myelogram  04/06/13    lumbar, Dr Gladstone Lighter  . Lumbar laminectomy/decompression microdiscectomy Right 06/17/2013    Procedure: LUMBAR LAMINECTOMY MICRODISCECTOMY L4-L5 RIGHT EXCISION OF SYNOVIAL CYST RIGHT   (1 LEVEL) RIGHT PARTIAL FACETECTOMY;  Surgeon: Tobi Bastos, MD;  Location: [facility];  Service: Orthopedics;  Laterality: Right;  . Back surgery    . Appendectomy  ~ 1952  . Tonsillectomy  ~ 1954  . Coronary angioplasty  1993  . Coronary angioplasty with stent placement  05/1997    "  1"  . Skin cancer  excision Right     "neck"    [id number] colds or fevers, no weight changes Skin:no rashes or ulcers HEENT:no blurred vision, no congestion CV:see HPI PUL:see HPI GI:no diarrhea constipation or melena, no indigestion GU:no hematuria, no dysuria MS:no joint pain, no claudication Neuro:no syncope, no lightheadedness Endo:+ diabetes, no thyroid disease  Wt Readings from Last 3 Encounters:  04/04/14 278 lb 8 oz (126.327 kg)  03/29/14 279 lb 6.4 oz (126.735 kg)  03/18/14 281 lb 12 oz (127.8 kg)    PHYSICAL EXAM BP 133/71  Pulse 91  Ht 6' (1.829 m)  Wt 278 lb 8 oz (126.327 kg)  BMI 37.76 kg/m2 General:Pleasant affect, NAD Skin:Warm and dry, brisk capillary refill HEENT:normocephalic, sclera clear, mucus membranes moist Neck:supple, no JVD, no bruits  Heart:S1S2 RRR with 1/6 systolic murmur, gallup, rub or click Lungs:clear without rales, rhonchi, or wheezes [id number], non tender, + BS, do not palpate liver spleen or masses Ext:no lower ext edema, 2+ pedal pulses, 2+ radial pulses Neuro:alert and oriented, MAE, follows commands, + facial symmetry  EKG:SR rate 80 no acute changes stable.  ASSESSMENT AND PLAN CAD, ARTERY BYPASS GRAFT Grafts patent except VG to diag is occluded.  No further complaints since cath.  Wenckebach block HR down to 30s , BB stopped had been on toprol 150 mg daily.  Now with HR in the 90s will add low dose BB. If symptoms of weakness return would add event monitor.  Discussed with Dr. Roni Bread   Sleep apnea With overnight oxygen  Type II or unspecified type diabetes mellitus with neurological manifestations, uncontrolled Glucose controled per pt  HYPERLIPIDEMIA, MIXED Lipid Panel     Component Value Date/Time   CHOL 150 03/18/2014 1003   TRIG 204.0* 03/18/2014 1003   HDL 25.90* 03/18/2014 1003   CHOLHDL 6 03/18/2014 1003   VLDL 40.8* 03/18/2014 1003   LDLCALC 52 12/20/2013 0815   LDLDIRECT 99.6 03/18/2014 1003      Keep appt in Nov. With  Dr. Vita Barley

## 2014-04-06 NOTE — Assessment & Plan Note (Signed)
Lipid Panel     Component Value Date/Time   CHOL 150 03/18/2014 1003   TRIG 204.0* 03/18/2014 1003   HDL 25.90* 03/18/2014 1003   CHOLHDL 6 03/18/2014 1003   VLDL 40.8* 03/18/2014 1003   LDLCALC 52 12/20/2013 0815   LDLDIRECT 99.6 03/18/2014 1003

## 2014-04-06 NOTE — Assessment & Plan Note (Signed)
Glucose controled per pt

## 2014-04-06 NOTE — Assessment & Plan Note (Signed)
Grafts patent except VG to diag is occluded.  No further complaints since cath.

## 2014-04-06 NOTE — Assessment & Plan Note (Signed)
With overnight oxygen

## 2014-04-06 NOTE — Assessment & Plan Note (Signed)
HR down to 30s , BB stopped had been on toprol 150 mg daily.  Now with HR in the 90s will add low dose BB. If symptoms of weakness return would add event monitor.  Discussed with Dr. Roni Bread

## 2014-04-15 ENCOUNTER — Other Ambulatory Visit: Payer: Self-pay

## 2014-04-21 ENCOUNTER — Ambulatory Visit (INDEPENDENT_AMBULATORY_CARE_PROVIDER_SITE_OTHER): Payer: Medicare Other | Admitting: Medical

## 2014-04-21 ENCOUNTER — Encounter: Payer: Self-pay | Admitting: Medical

## 2014-04-21 VITALS — BP 146/70 | HR 81 | Temp 97.9°F | Ht 72.08 in | Wt 280.2 lb

## 2014-04-21 DIAGNOSIS — J0100 Acute maxillary sinusitis, unspecified: Secondary | ICD-10-CM | POA: Insufficient documentation

## 2014-04-21 DIAGNOSIS — J0101 Acute recurrent maxillary sinusitis: Secondary | ICD-10-CM | POA: Diagnosis not present

## 2014-04-21 DIAGNOSIS — I200 Unstable angina: Secondary | ICD-10-CM

## 2014-04-21 MED ORDER — MONTELUKAST SODIUM 10 MG PO TABS: 10.0000 mg | ORAL_TABLET | Freq: Every day | ORAL | Status: DC

## 2014-04-21 MED ORDER — BENZONATATE 100 MG PO CAPS: 100.0000 mg | ORAL_CAPSULE | Freq: Three times a day (TID) | ORAL | Status: DC | PRN

## 2014-04-21 MED ORDER — CEFDINIR 300 MG PO CAPS: 300.0000 mg | ORAL_CAPSULE | Freq: Two times a day (BID) | ORAL | Status: DC

## 2014-04-21 NOTE — Progress Notes (Signed)
Pre visit review using our clinic review tool, if applicable. No additional management support is needed unless otherwise documented below in the visit note. 

## 2014-04-21 NOTE — Patient Instructions (Signed)
You appear to have sinus infection again related to  persistent allergy symptoms. I will prescribe cefdinir and benzonatate. I will add montelukast to work in conjunction with Human resources officer and flonase.   Follow up in 7 days or as needed.

## 2014-04-21 NOTE — Progress Notes (Signed)
Subjective:    Patient ID: Richard [name], male    DOB: 06-23-1941, 73 y.o.   MRN: [id number]  HPI  Pt in stating that since Monday he started to feel sick. Pt uses netty pot on Monday. He sinus are tender and burning. Each morning thick small yellow tint/discharge . Pt feels run down. No fever or chills. No bodyaches. No ear pain. Sneezing quite a bit past week.   Past Medical History  Diagnosis Date  . Gout   . Esophageal stricture     X 4  . Mixed hyperlipidemia   . Peptic stricture of esophagus   . Antritis (stomach)     mild  . Duodenitis     chronic  . Diverticulosis     mild, left colon  . Anemia   . HTN (hypertension)   . Sliding hiatal hernia   . Allergy   . GERD (gastroesophageal reflux disease)   . CAD (coronary artery disease)     followed by Dr. Verl Blalock  . HNP (herniated nucleus pulposus), lumbar   . Nocturia   . Adenomatous colon polyp     tubular  . Vitamin B12 deficiency   . Spinal stenosis   . Skin cancer     "right neck cut off; several frozen off my arms" (03/15/2014)  . Heart murmur   . On home oxygen therapy     "2L q hs w/CPAP" (03/15/2014)  . OSA on CPAP     with 2L O2 at night  . Type II diabetes mellitus   . Migraines     "til age 1 yrs"  . Arthritis     "knees, hips, shoulders" (03/15/2014)  . History of gout   . Chronic lower back pain   . Renal insufficiency   . Wenckebach block     a. brady down to 30s due to frequent block, metoprolol held in response.    History   Social History  . Marital Status: Divorced    Spouse Name: N/A    Number of Children: 2  . Years of Education: N/A   Occupational History  .     Social History Main Topics  . Smoking status: Never Smoker   . Smokeless tobacco: Never Used  . Alcohol Use: Yes     Comment: "last drink was in 2012"( 03/15/2014)  . Drug Use: No  . Sexual Activity: Not Currently   Other Topics Concern  . Not on file   Social History Narrative   Divorced, lives with a roommate.     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    Past Surgical History  Procedure Laterality Date  . Vasectomy    . Coronary artery bypass graft  03/2000    "CABG X5"  . Esophageal dilation  X 3-4    Dr. Lyla Son; "last one was in the 1990's"  . Esophagogastroduodenoscopy      multiple  . Flexible sigmoidoscopy      multiple  . Panendoscopy    . Knee arthroscopy Left 2011    meniscus repair  . Shoulder surgery Right 08/2010    screws placed; "tendons tore off"  . Colonoscopy w/ biopsies and polypectomy  2013  . Upper gi endoscopy  2013    Gastritis; Dr Carlean Purl  . Myelogram  04/06/13    lumbar, Dr Gladstone Lighter  . Lumbar laminectomy/decompression microdiscectomy Right 06/17/2013    Procedure: LUMBAR LAMINECTOMY MICRODISCECTOMY L4-L5 RIGHT EXCISION OF SYNOVIAL CYST RIGHT   (1 LEVEL) RIGHT PARTIAL FACETECTOMY;  Surgeon: Tobi Bastos, MD;  Location: [facility];  Service: Orthopedics;  Laterality: Right;  . Back surgery    . Appendectomy  ~ 1952  . Tonsillectomy  ~ 1954  . Coronary angioplasty  1993  . Coronary angioplasty with stent placement  05/1997    "1"  . Skin cancer excision Right     "neck"    Family History  Problem Relation Age of Onset  . Stroke Father   . Hypertension Father   . Pancreatic cancer Mother   . Diabetes Maternal Grandmother   . Stroke Maternal Grandmother   . Heart attack Paternal Grandmother   . Colon cancer Neg Hx   . Esophageal cancer Neg Hx   . Rectal cancer Neg Hx   . Stomach cancer Neg Hx   . Ulcers Neg Hx     Allergies  Allergen Reactions  . Benazepril     angioedema; he is not a candidate for any angiotensin receptor blockers because of this significant allergic reaction. Because of a history of documented adverse serious drug reaction;Medi Alert bracelet  is  recommended  . Hctz [Hydrochlorothiazide] Swelling    Tongue and lip swelling   . Aspirin Other (See Comments)    Gastritis, cant take 325 Mg aspirin     Current Outpatient Prescriptions on File Prior to Visit  Medication Sig Dispense Refill  . allopurinol (ZYLOPRIM) 300 MG tablet Take 450 mg by mouth daily.       [name] amLODipine (NORVASC) 5 MG tablet Take 2 tablets (10 mg total) by mouth daily.  60 tablet  6  . Ascorbic Acid (VITAMIN C) 500 MG tablet Take 500 mg by mouth every morning.       [name] aspirin 81 MG tablet Take 81 mg by mouth daily.      [name] atorvastatin (LIPITOR) 80 MG tablet TAKE ONE TABLET BY MOUTH AT BEDTIME   90 tablet  2  . esomeprazole (NEXIUM) 40 MG capsule Take 1 capsule (40 mg total) by mouth daily.  30 capsule  5  . fenofibrate 160 MG tablet TAKE ONE TABLET BY MOUTH ONE TIME DAILY  30 tablet  2  . fexofenadine (ALLEGRA) 180 MG tablet Take 90 mg by mouth 2 (two) times daily.       . fluticasone (FLONASE) 50 MCG/ACT nasal spray Place 1 spray into both nostrils 2 (two) times daily as needed for allergies or rhinitis.  16 g  2  . furosemide (LASIX) 40 MG tablet TAKE ONE TABLET BY MOUTH TWICE DAILY   180 tablet  2  . glimepiride (AMARYL) 2 MG tablet Take 1 mg by mouth daily before breakfast.      . glucose blood test strip One touch Verio test strips Check blood sugar once daily Dx 250.00  100 each  11  . metFORMIN (GLUCOPHAGE) 1000 MG tablet TAKE ONE TABLET BY MOUTH TWICE DAILY WITH MEALS  60 tablet  2  . metoprolol tartrate (LOPRESSOR) 25 MG tablet Take 0.5 tablets (12.5 mg total) by mouth 2 (two) times daily.  30 tablet  6  . nitroGLYCERIN (NITROSTAT) 0.4 MG SL tablet Place 0.4 mg under the tongue every 5 (five) minutes as needed for chest pain.      Glory Rosebush DELICA LANCETS FINE MISC Check  blood sugar once daily Dx 250.00  100 each  11  . potassium chloride SA (K-DUR,KLOR-CON) 20 MEQ tablet Take 1 tablet (20 mEq total) by mouth 2 (two) times daily.  60 tablet  1  .  pregabalin (LYRICA) 75 MG capsule Take 75 mg by mouth 2 (two) times daily.      . psyllium (HYDROCIL/METAMUCIL) 95 % PACK Take 1 packet by mouth 2 (two) times daily.      [name] ZETIA 10 MG tablet Take one tablet by mouth one time daily  30 tablet  6   No current facility-administered medications on file prior to visit.    BP 146/70  Pulse 81  Temp(Src) 97.9 F (36.6 C) (Oral)  Ht 6' 0.08" (1.831 m)  Wt 280 lb 3.2 oz (127.098 kg)  BMI 37.91 kg/m2  SpO2 94%         Review of Systems  Constitutional: Negative for fever, chills and fatigue.  HENT: Positive for congestion, mouth sores, postnasal drip, sinus pressure and sore throat.   Respiratory: Negative for cough, shortness of breath and wheezing.   Cardiovascular: Negative for chest pain and palpitations.       No regarding his comments on his possible bradycardia intermittently. He never states that he has any symptoms at all. He basically states he is asymptomatic.  Gastrointestinal: Negative.   Genitourinary: Negative.   Musculoskeletal: Negative.   Neurological: Negative for dizziness, tremors, seizures, syncope, facial asymmetry, speech difficulty, weakness, light-headedness, numbness and headaches.  Hematological: Negative for adenopathy. Does not bruise/bleed easily.  Psychiatric/Behavioral: Negative.        Objective:   Physical Exam   General  Mental Status - Alert. General Appearance - Well groomed. Not in acute distress.  Skin Rashes- No Rashes.  HEENT Head- Normal. Ear Auditory Canal - Left- Normal. Right - Normal.Tympanic Membrane- Left- Normal. Right- Normal. Eye Sclera/Conjunctiva- Left- Normal. Right- Normal. Nose & Sinuses Nasal Mucosa- Left-  Boggy + Congested. Right-  Boggy + Congested. Maxillary sinus pressure. Mouth & Throat Lips: Upper Lip- Normal: no dryness, cracking, pallor, cyanosis, or vesicular eruption. Lower Lip-Normal: no dryness, cracking, pallor, cyanosis or vesicular  eruption. Buccal Mucosa- Bilateral- No Aphthous ulcers. Oropharynx- No Discharge or Erythema. Tonsils: Characteristics- Bilateral- No Erythema or Congestion. Size/Enlargement- Bilateral- No enlargement. Discharge- bilateral-None.  Neck Neck- Supple. No Masses.   Chest and Lung Exam Auscultation: Breath Sounds:-Normal. CTA  Cardiovascular Auscultation:Rythm- Regular, rate and rhythm. Murmurs & Other Heart Sounds:Ausculatation of the heart reveal- No Murmurs.  Lymphatic Head & Neck General Head & Neck Lymphatics: Bilateral: Description- No Localized lymphadenopathy.         Assessment & Plan:  Patient did have some questions and comments regarding recent workup of some bradycardia. He describes that the cardiologist is evaluating him for possible sick sinus syndrome. He mentions that when he checks his blood pressure his pulse does drop a little bit. He questions whether or not his blood pressure cuff is accurate when he checks his pulse. I did advise him that he could try get O2 sat/pulse finger monitor over-the-counter. She can simply put on his finger and monitor fluctuations in pulse and see if his pulse is decreasing a great deal for no reason. If he does say that this is the case then he should let his cardiologist no or let us know and we could make a quick referral back to the cardiologist. But on his description it seems that they're keeping a close eye on him.

## 2014-04-21 NOTE — Assessment & Plan Note (Signed)
Sinusitis that appears to be recurrent allergic rhinitis exacerbation. He will continue Allegra and his nasal steroid spray but I am adding montelukast. Also will write  prescription of Cefdinir.

## 2014-04-22 DIAGNOSIS — M1712 Unilateral primary osteoarthritis, left knee: Secondary | ICD-10-CM | POA: Diagnosis not present

## 2014-05-04 ENCOUNTER — Other Ambulatory Visit: Payer: Self-pay | Admitting: Family Medicine

## 2014-05-18 ENCOUNTER — Encounter: Payer: Self-pay | Admitting: Cardiology

## 2014-05-18 ENCOUNTER — Ambulatory Visit (INDEPENDENT_AMBULATORY_CARE_PROVIDER_SITE_OTHER): Payer: Medicare Other | Admitting: Cardiology

## 2014-05-18 VITALS — BP 142/76 | HR 83 | Ht 72.0 in | Wt 283.0 lb

## 2014-05-18 DIAGNOSIS — I25810 Atherosclerosis of coronary artery bypass graft(s) without angina pectoris: Secondary | ICD-10-CM | POA: Diagnosis not present

## 2014-05-18 DIAGNOSIS — R0989 Other specified symptoms and signs involving the circulatory and respiratory systems: Secondary | ICD-10-CM

## 2014-05-18 DIAGNOSIS — I10 Essential (primary) hypertension: Secondary | ICD-10-CM

## 2014-05-18 DIAGNOSIS — I200 Unstable angina: Secondary | ICD-10-CM

## 2014-05-18 NOTE — Progress Notes (Signed)
HPI The patient presents for follow up of coronary artery disease. He has had bypass several years ago. He was in the hospital in September with some dizziness. The patient underwent cardiac catheterization on 03/16/2014 which showed 100% mid LAD chronic occlusion, with mid to distal vessel filled with patent LIMA graft, diffuse 90% stenosis in OM1, complete occlusion in OM 2, patent sequential SVG to OM1/OM 2, and 99% proximal RCA stenosis with 100% mid RCA occlusion, patent LIMA to RCA, patient does have occluded SVG to diagonal branch. With 4 out 5 of previous bypass grafts were still patent medical management was recommended.  He did have bradycardia and was taken off his beta blocker.. When he came back to see his heart rate was increased and he was restarted on a low dose of beta blocker. He says he is not having any of the severe dyspnea and lightheadedness that he was having. He still has some shortness of breath and was recently started on Singulair for this. He says this really hasn't helped much. He continues to have some lower extremity swelling. He's limited by joint pain. He was limited by back pain but this improved after surgery. He denies any chest pressure, neck or arm discomfort. He has had no new palpitations, presyncope or syncope. He has had about a 10 pound weight gain which he blames on Lyrica.    Allergies  Allergen Reactions  . Benazepril     angioedema; he is not a candidate for any angiotensin receptor blockers because of this significant allergic reaction. Because of a history of documented adverse serious drug reaction;Medi Alert bracelet  is recommended  . Hctz [Hydrochlorothiazide] Swelling    Tongue and lip swelling   . Aspirin Other (See Comments)    Gastritis, cant take 325 Mg aspirin     Current Outpatient Prescriptions  Medication Sig Dispense Refill  . allopurinol (ZYLOPRIM) 300 MG tablet Take 450 mg by mouth daily.     [name] amLODipine (NORVASC) 5 MG tablet  Take 2 tablets (10 mg total) by mouth daily. 60 tablet 6  . Ascorbic Acid (VITAMIN C) 500 MG tablet Take 500 mg by mouth every morning.     [name] aspirin 81 MG tablet Take 81 mg by mouth daily.    [name] atorvastatin (LIPITOR) 80 MG tablet TAKE ONE TABLET BY MOUTH AT BEDTIME  90 tablet 2  . esomeprazole (NEXIUM) 40 MG capsule Take 1 capsule (40 mg total) by mouth daily. 30 capsule 5  . fenofibrate 160 MG tablet TAKE ONE TABLET BY MOUTH ONE TIME DAILY 30 tablet 2  . fexofenadine (ALLEGRA) 180 MG tablet Take 90 mg by mouth 2 (two) times daily.     . fluticasone (FLONASE) 50 MCG/ACT nasal spray Place 1 spray into both nostrils 2 (two) times daily as needed for allergies or rhinitis. 16 g 2  . furosemide (LASIX) 40 MG tablet TAKE ONE TABLET BY MOUTH TWICE DAILY  180 tablet 2  . glimepiride (AMARYL) 2 MG tablet TAKE ONE TABLET BY MOUTH EVERY MORNING BEFORE BREAKFAST  30 tablet 1  . glucose blood test strip One touch Verio test strips Check blood sugar once daily Dx 250.00 100 each 11  . metFORMIN (GLUCOPHAGE) 1000 MG tablet TAKE ONE TABLET BY MOUTH TWICE DAILY WITH MEALS 60 tablet 2  . metoprolol tartrate (LOPRESSOR) 25 MG tablet Take 0.5 tablets (12.5 mg total) by mouth 2 (two) times daily. 30 tablet 6  . montelukast (SINGULAIR) 10 MG tablet Take  1 tablet (10 mg total) by mouth at bedtime. 30 tablet 3  . nitroGLYCERIN (NITROSTAT) 0.4 MG SL tablet Place 0.4 mg under the tongue every 5 (five) minutes as needed for chest pain.    Glory Rosebush DELICA LANCETS FINE MISC Check blood sugar once daily Dx 250.00 100 each 11  . potassium chloride SA (K-DUR,KLOR-CON) 20 MEQ tablet Take 1 tablet (20 mEq total) by mouth 2 (two) times daily. 60 tablet 1  . pregabalin (LYRICA) 75 MG capsule Take 75 mg by mouth 2 (two) times daily.    . psyllium (HYDROCIL/METAMUCIL) 95 % PACK Take 1 packet by mouth 2 (two) times daily.    [name] ZETIA 10 MG tablet Take one tablet by mouth one time daily 30 tablet 6   No current  facility-administered medications for this visit.    Past Medical History  Diagnosis Date  . Gout   . Esophageal stricture     X 4  . Mixed hyperlipidemia   . Peptic stricture of esophagus   . Antritis (stomach)     mild  . Duodenitis     chronic  . Diverticulosis     mild, left colon  . Anemia   . HTN (hypertension)   . Sliding hiatal hernia   . GERD (gastroesophageal reflux disease)   . CAD (coronary artery disease)   . HNP (herniated nucleus pulposus), lumbar   . Nocturia   . Adenomatous colon polyp     tubular  . Vitamin B12 deficiency   . Spinal stenosis   . Skin cancer     "right neck cut off; several frozen off my arms" (03/15/2014)  . Heart murmur   . OSA on CPAP     with 2L O2 at night  . Type II diabetes mellitus   . Migraines     "til age 52 yrs"  . Arthritis     "knees, hips, shoulders" (03/15/2014)  . History of gout   . Chronic lower back pain   . Renal insufficiency   . Wenckebach block     a. brady down to 30s due to frequent block, metoprolol held in response.    Past Surgical History  Procedure Laterality Date  . Vasectomy    . Coronary artery bypass graft  03/2000    "CABG X5"  . Esophageal dilation  X 3-4    Dr. Lyla Son; "last one was in the 1990's"  . Esophagogastroduodenoscopy      multiple  . Flexible sigmoidoscopy      multiple  . Panendoscopy    . Knee arthroscopy Left 2011    meniscus repair  . Shoulder surgery Right 08/2010    screws placed; "tendons tore off"  . Colonoscopy w/ biopsies and polypectomy  2013  . Upper gi endoscopy  2013    Gastritis; Dr Carlean Purl  . Myelogram  04/06/13    lumbar, Dr Gladstone Lighter  . Lumbar laminectomy/decompression microdiscectomy Right 06/17/2013    Procedure: LUMBAR LAMINECTOMY MICRODISCECTOMY L4-L5 RIGHT EXCISION OF SYNOVIAL CYST RIGHT   (1 LEVEL) RIGHT PARTIAL FACETECTOMY;  Surgeon: Tobi Bastos, MD;  Location: [facility];  Service: Orthopedics;  Laterality: Right;  . Back surgery    .  Appendectomy  ~ 1952  . Tonsillectomy  ~ 1954  . Coronary angioplasty  1993  . Coronary angioplasty with stent placement  05/1997    "1"  . Skin cancer excision Right     "neck"    ROS:  As stated in  the HPI and negative for all other systems.  PHYSICAL EXAM BP 142/76 mmHg  Pulse 83  Ht 6' (1.829 m)  Wt 283 lb (128.368 kg)  BMI 38.37 kg/m2 GENERAL:  Well appearing HEENT:  Pupils equal round and reactive, fundi not visualized, oral mucosa unremarkable NECK:  No jugular venous distention, waveform within normal limits, carotid upstroke brisk and symmetric, bilateral bruits versus transmitted systolic murmur bruits, no thyromegaly LYMPHATICS:  No cervical, inguinal adenopathy LUNGS:  Clear to auscultation bilaterally BACK:  No CVA tenderness CHEST:  Well healed sternotomy scar. HEART:  PMI not displaced or sustained,S1 and S2 within normal limits, no S3, no S4, no clicks, no rubs, apical systolic murmur radiating out the aortic outflow tract and possibly increase with a strain phase of Valsalva, no diastolic murmurs ABD:  Flat, positive bowel sounds normal in frequency in pitch, no bruits, no rebound, no guarding, no midline pulsatile mass, no hepatomegaly, no splenomegaly, obese EXT:  2 plus pulses throughout, mild bilateral lower extremityedema, no cyanosis no clubbing SKIN:  No rashes no nodules NEURO:  Cranial nerves II through XII grossly intact, motor grossly intact throughout PSYCH:  Cognitively intact, oriented to person place and time  ASSESSMENT AND PLAN  CAD:  The patient has no new sypmtoms.  No further cardiovascular testing is indicated.  We will continue with aggressive risk reduction and meds as listed.  MURMUR:  I did an echocardiogram earlier this year and there was only some TR. I suspect he might have some dynamic outflow obstruction and will probably repeat this in a year.  BRADYCARDIA:  This seems to be improved with a low dose of beta blocker only. He will  continue with this.  BRUIT:  He will have carotid Dopplers.  DYSLIPIDEMIA:   He will keep the meds as listedwith a reasonable LDL as listed below.    Lab Results  Component Value Date   CHOL 150 03/18/2014   TRIG 204.0* 03/18/2014   HDL 25.90* 03/18/2014   LDLCALC 52 12/20/2013   LDLDIRECT 99.6 03/18/2014    DM:  This is being followed by Garnet Koyanagi, DO   Lab Results  Component Value Date   HGBA1C 7.2* 03/18/2014

## 2014-05-18 NOTE — Patient Instructions (Addendum)
We are ordering Carotid dopplers   Your physician recommends that you schedule a follow-up appointment in: 6 months with Dr. Percival Spanish

## 2014-05-25 ENCOUNTER — Ambulatory Visit (HOSPITAL_COMMUNITY)
Admission: RE | Admit: 2014-05-25 | Discharge: 2014-05-25 | Disposition: A | Discharge status: Home / Self Care | Payer: Medicare Other | Source: Ambulatory Visit | Attending: Internal Medicine | Admitting: Internal Medicine

## 2014-05-25 DIAGNOSIS — R0989 Other specified symptoms and signs involving the circulatory and respiratory systems: Secondary | ICD-10-CM | POA: Insufficient documentation

## 2014-05-25 DIAGNOSIS — I6523 Occlusion and stenosis of bilateral carotid arteries: Secondary | ICD-10-CM | POA: Diagnosis not present

## 2014-05-25 NOTE — Progress Notes (Signed)
Carotid Duplex Completed. °Brianna L Mazza,RVT °

## 2014-06-02 ENCOUNTER — Other Ambulatory Visit: Payer: Self-pay

## 2014-06-02 ENCOUNTER — Ambulatory Visit (INDEPENDENT_AMBULATORY_CARE_PROVIDER_SITE_OTHER): Payer: Medicare Other | Admitting: Family Medicine

## 2014-06-02 ENCOUNTER — Encounter: Payer: Self-pay | Admitting: Family Medicine

## 2014-06-02 VITALS — BP 134/70 | HR 78 | Temp 97.8°F | Wt 287.4 lb

## 2014-06-02 DIAGNOSIS — J020 Streptococcal pharyngitis: Secondary | ICD-10-CM | POA: Diagnosis not present

## 2014-06-02 DIAGNOSIS — I200 Unstable angina: Secondary | ICD-10-CM

## 2014-06-02 LAB — POCT RAPID STREP A (OFFICE): RAPID STREP A SCREEN: NEGATIVE

## 2014-06-02 MED ORDER — EZETIMIBE 10 MG PO TABS: 10.0000 mg | ORAL_TABLET | Freq: Every day | ORAL | Status: DC

## 2014-06-02 MED ORDER — AMOXICILLIN 875 MG PO TABS: 875.0000 mg | ORAL_TABLET | Freq: Two times a day (BID) | ORAL | Status: DC

## 2014-06-02 NOTE — Progress Notes (Signed)
Pre visit review using our clinic review tool, if applicable. No additional management support is needed unless otherwise documented below in the visit note. 

## 2014-06-02 NOTE — Patient Instructions (Signed)

## 2014-06-02 NOTE — Progress Notes (Signed)
  Subjective:     Richard [name] is a 73 y.o. male who presents for evaluation of sore throat. Associated symptoms include itching in eyes, nasal blockage, pain while swallowing, post nasal drip, productive cough, sinus and nasal congestion and sore throat. Onset of symptoms was 1 day ago, and have been gradually worsening since that time-- his allergies have been bothering him for months. He is drinking plenty of fluids. He has not had a recent close exposure to someone with proven streptococcal pharyngitis. 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   ---exposure unknown  The following portions of the patient's history were reviewed and updated as appropriate: allergies, current medications, past family history, past medical history, past social history, past surgical history and problem list.  Review of Systems Pertinent items are noted in HPI.    Objective:    BP 134/70 mmHg  Pulse 78  Temp(Src) 97.8 F (36.6 C) (Oral)  Wt 287 lb 6.4 oz (130.364 kg)  SpO2 92% General appearance: alert, cooperative, appears stated age and no distress Ears: normal TM's and external ear canals both ears Nose: green discharge, moderate congestion, turbinates red, swollen, no sinus tenderness Throat: abnormal findings: moderate oropharyngeal erythema Neck: moderate anterior cervical adenopathy, supple, symmetrical, trachea midline and thyroid not enlarged, symmetric, no tenderness/mass/nodules Lungs: clear to auscultation bilaterally Heart: S1, S2 normal  Laboratory Strep test done. Results:negative.    Assessment:    Acute pharyngitis, likely  Bacterial tonsillitis R/O strep.    Plan:    Patient placed on antibiotics. Use of OTC analgesics recommended as well as salt water gargles. Follow up as needed.

## 2014-06-09 ENCOUNTER — Encounter (HOSPITAL_COMMUNITY): Payer: Self-pay | Admitting: Cardiovascular Disease

## 2014-06-14 ENCOUNTER — Telehealth: Payer: Self-pay | Admitting: Cardiology

## 2014-06-14 ENCOUNTER — Other Ambulatory Visit: Payer: Self-pay | Admitting: Family Medicine

## 2014-06-14 ENCOUNTER — Telehealth: Payer: Self-pay | Admitting: Family Medicine

## 2014-06-14 DIAGNOSIS — I6523 Occlusion and stenosis of bilateral carotid arteries: Secondary | ICD-10-CM

## 2014-06-14 DIAGNOSIS — E042 Nontoxic multinodular goiter: Secondary | ICD-10-CM

## 2014-06-14 NOTE — Telephone Encounter (Signed)
Carotid doppler results given to patient.  Voiced understanding.   Order placed for recheck in one year.

## 2014-06-14 NOTE — Telephone Encounter (Signed)
Please advise on why the patient has been scheduled for a neck US.      KP

## 2014-06-14 NOTE — Telephone Encounter (Signed)
Dr Percival Spanish contacted me about his carotid US-- he should have discussed it with patient.  There were nodules above the thyroid--- we need to evaluate that further

## 2014-06-14 NOTE — Telephone Encounter (Signed)
Patient has been made aware and stated the had no idea, He said Dr.Hichrein's nurse had not made him aware of the nodule. He has agreed to complete further studies. I advised that Delsa Sale would call him tomorrow.     KP

## 2014-06-14 NOTE — Telephone Encounter (Signed)
Called patient to scheduled his neck US, pt does not know why he is having this. He states his cardiologist order a carotid and he has not received his results. Please advise

## 2014-06-14 NOTE — Telephone Encounter (Signed)
Pt called in stating that he had a carotid done on 11/25 and has not received any results. Please call  Thanks

## 2014-06-15 NOTE — Telephone Encounter (Signed)
Pt coming @ 10:30 on 06/16/14

## 2014-06-16 ENCOUNTER — Ambulatory Visit: Payer: Medicare Other | Admitting: Family Medicine

## 2014-06-16 ENCOUNTER — Ambulatory Visit (HOSPITAL_BASED_OUTPATIENT_CLINIC_OR_DEPARTMENT_OTHER)
Admission: RE | Admit: 2014-06-16 | Discharge: 2014-06-16 | Disposition: A | Discharge status: Home / Self Care | Payer: Medicare Other | Source: Ambulatory Visit | Attending: Family Medicine | Admitting: Family Medicine

## 2014-06-16 ENCOUNTER — Other Ambulatory Visit: Payer: Self-pay | Admitting: Family Medicine

## 2014-06-16 DIAGNOSIS — E042 Nontoxic multinodular goiter: Secondary | ICD-10-CM

## 2014-06-16 DIAGNOSIS — E041 Nontoxic single thyroid nodule: Secondary | ICD-10-CM | POA: Insufficient documentation

## 2014-06-16 DIAGNOSIS — R599 Enlarged lymph nodes, unspecified: Secondary | ICD-10-CM | POA: Diagnosis not present

## 2014-06-20 ENCOUNTER — Telehealth: Payer: Self-pay

## 2014-06-20 NOTE — Telephone Encounter (Signed)
Notes Recorded by Rosalita Chessman, DO on 06/16/2014 at 12:58 PM + thyroid nodule--- too small to biopsy--  We need to check thyroid function in blood--- please make an appointment for labs

## 2014-06-20 NOTE — Telephone Encounter (Signed)
Patient has been made aware and voiced understanding, he is still feeling bad and would like to come in tomorrow. Apt scheduled and he agreed to have his labs done at that time.     KP

## 2014-06-20 NOTE — Telephone Encounter (Signed)
Harshdeep Peter Garter [phone]  R[place]ord was calling to get results of [place] from last week.

## 2014-06-21 ENCOUNTER — Ambulatory Visit (HOSPITAL_BASED_OUTPATIENT_CLINIC_OR_DEPARTMENT_OTHER)
Admission: RE | Admit: 2014-06-21 | Discharge: 2014-06-21 | Disposition: A | Discharge status: Home / Self Care | Payer: Medicare Other | Source: Ambulatory Visit | Attending: Family Medicine | Admitting: Family Medicine

## 2014-06-21 ENCOUNTER — Ambulatory Visit (INDEPENDENT_AMBULATORY_CARE_PROVIDER_SITE_OTHER): Payer: Medicare Other | Admitting: Family Medicine

## 2014-06-21 ENCOUNTER — Encounter: Payer: Self-pay | Admitting: Family Medicine

## 2014-06-21 VITALS — BP 144/72 | HR 74 | Temp 98.4°F | Resp 16 | Wt 282.2 lb

## 2014-06-21 DIAGNOSIS — J069 Acute upper respiratory infection, unspecified: Secondary | ICD-10-CM

## 2014-06-21 DIAGNOSIS — E1139 Type 2 diabetes mellitus with other diabetic ophthalmic complication: Secondary | ICD-10-CM

## 2014-06-21 DIAGNOSIS — Z951 Presence of aortocoronary bypass graft: Secondary | ICD-10-CM | POA: Insufficient documentation

## 2014-06-21 DIAGNOSIS — R0989 Other specified symptoms and signs involving the circulatory and respiratory systems: Secondary | ICD-10-CM | POA: Diagnosis not present

## 2014-06-21 DIAGNOSIS — R05 Cough: Secondary | ICD-10-CM | POA: Insufficient documentation

## 2014-06-21 DIAGNOSIS — I10 Essential (primary) hypertension: Secondary | ICD-10-CM | POA: Diagnosis not present

## 2014-06-21 DIAGNOSIS — E785 Hyperlipidemia, unspecified: Secondary | ICD-10-CM

## 2014-06-21 DIAGNOSIS — I200 Unstable angina: Secondary | ICD-10-CM | POA: Diagnosis not present

## 2014-06-21 DIAGNOSIS — E041 Nontoxic single thyroid nodule: Secondary | ICD-10-CM | POA: Diagnosis not present

## 2014-06-21 DIAGNOSIS — J208 Acute bronchitis due to other specified organisms: Secondary | ICD-10-CM | POA: Diagnosis not present

## 2014-06-21 DIAGNOSIS — R059 Cough, unspecified: Secondary | ICD-10-CM

## 2014-06-21 DIAGNOSIS — E119 Type 2 diabetes mellitus without complications: Secondary | ICD-10-CM | POA: Diagnosis not present

## 2014-06-21 LAB — POCT URINALYSIS DIPSTICK
Bilirubin, UA: NEGATIVE
Blood, UA: NEGATIVE
Glucose, UA: NEGATIVE
KETONES UA: NEGATIVE
Leukocytes, UA: NEGATIVE
Nitrite, UA: NEGATIVE
PH UA: 6
Protein, UA: NEGATIVE
SPEC GRAV UA: 1.02
UROBILINOGEN UA: 2

## 2014-06-21 MED ORDER — AZITHROMYCIN 250 MG PO TABS: ORAL_TABLET | ORAL | Status: DC

## 2014-06-21 MED ORDER — AZELASTINE HCL 0.1 % NA SOLN: 1.0000 | Freq: Two times a day (BID) | NASAL | Status: DC

## 2014-06-21 NOTE — Progress Notes (Signed)
Pre visit review using our clinic review tool, if applicable. No additional management support is needed unless otherwise documented below in the visit note. 

## 2014-06-21 NOTE — Patient Instructions (Signed)

## 2014-06-21 NOTE — Progress Notes (Signed)
Subjective:     Richard [name] is a 73 y.o. male here for evaluation of a cough. Onset of symptoms was a few weeks ago. Symptoms have been not completely better since that time. The cough is productive and is aggravated by  activity. Associated symptoms include: postnasal drip, sputum production and wheezing. Patient does not have a history of asthma. Patient does have a history of environmental allergens. Patient has not traveled recently. Patient does not have a history of smoking. Patient has not had a previous chest x-ray. Patient has not had a PPD done.   HPI HYPERTENSION  Blood pressure range-not checking  Chest pain- no      Dyspnea- no Lightheadedness- no   Edema- no Other side effects - no   Medication compliance: good Low salt diet- yes  DIABETES  Blood Sugar ranges-good per pt  Polyuria- no New Visual problems- no Hypoglycemic symptoms- no Other side effects-no Medication compliance - good Last eye exam- 08/25/2013 Foot exam- 08/30/2013  HYPERLIPIDEMIA  Medication compliance- good RUQ pain- no  Muscle aches- no Other side effects-no    The following portions of the patient's history were reviewed and updated as appropriate:  He  has a past medical history of Gout; Esophageal stricture; Mixed hyperlipidemia; Peptic stricture of esophagus; Antritis (stomach); Duodenitis; Diverticulosis; Anemia; HTN (hypertension); Sliding hiatal hernia; GERD (gastroesophageal reflux disease); CAD (coronary artery disease); HNP (herniated nucleus pulposus), lumbar; Nocturia; Adenomatous colon polyp; Vitamin B12 deficiency; Spinal stenosis; Skin cancer; Heart murmur; OSA on CPAP; Type II diabetes mellitus; Migraines; Arthritis; History of gout; Chronic lower back pain; Renal insufficiency; and Wenckebach block. He  does not have any pertinent problems on file. He  has past surgical history that includes Vasectomy; Coronary artery bypass graft (03/2000); Esophageal dilation (X 3-4);  Esophagogastroduodenoscopy; Flexible sigmoidoscopy; Panendoscopy; Knee arthroscopy (Left, 2011); Shoulder surgery (Right, 08/2010); Colonoscopy w/ biopsies and polypectomy (2013); Upper gi endoscopy (2013); Myelogram (04/06/13); Lumbar laminectomy/decompression microdiscectomy (Right, 06/17/2013); Back surgery; Appendectomy (~ 1952); Tonsillectomy (~ 1954); Coronary angioplasty (1993); Coronary angioplasty with stent (05/1997); Skin cancer excision (Right); and left heart catheterization with coronary/graft angiogram (N/A, 03/16/2014). His family history includes Diabetes in his maternal grandmother; Heart attack in his paternal grandmother; Hypertension in his father; Pancreatic cancer in his mother; Stroke in his father and maternal grandmother. There is no history of Colon cancer, Esophageal cancer, Rectal cancer, Stomach cancer, or Ulcers. He  reports that he has never smoked. He has never used smokeless tobacco. He reports that he drinks alcohol. He reports that he does not use illicit drugs. He has a current medication list which includes the following prescription(s): allopurinol, amlodipine, vitamin c, aspirin, atorvastatin, esomeprazole, ezetimibe, fenofibrate, fexofenadine, fluticasone, furosemide, glimepiride, glucose blood, metformin, metoprolol tartrate, montelukast, nitroglycerin, onetouch delica lancets fine, potassium chloride sa, pregabalin, psyllium, azelastine, and azithromycin. Current Outpatient Prescriptions on File Prior to Visit  Medication Sig Dispense Refill  . allopurinol (ZYLOPRIM) 300 MG tablet Take 450 mg by mouth daily.     [name] amLODipine (NORVASC) 5 MG tablet Take 2 tablets (10 mg total) by mouth daily. 60 tablet 6  . Ascorbic Acid (VITAMIN C) 500 MG tablet Take 500 mg by mouth every morning.     [name] aspirin 81 MG tablet Take 81 mg by mouth daily.    [name] atorvastatin (LIPITOR) 80 MG tablet TAKE ONE TABLET BY MOUTH AT BEDTIME  90 tablet 2  . esomeprazole (NEXIUM) 40 MG capsule Take 1  capsule (40 mg total) by mouth daily. [place]  capsule 5  . ezetimibe (ZETIA) 10 MG tablet Take 1 tablet (10 mg total) by mouth daily. 30 tablet 2  . fenofibrate 160 MG tablet TAKE ONE TABLET BY MOUTH ONE TIME DAILY 30 tablet 2  . fexofenadine (ALLEGRA) 180 MG tablet Take 90 mg by mouth 2 (two) times daily.     . fluticasone (FLONASE) 50 MCG/ACT nasal spray Place 1 spray into both nostrils 2 (two) times daily as needed for allergies or rhinitis. 16 g 2  . furosemide (LASIX) 40 MG tablet TAKE ONE TABLET BY MOUTH TWICE DAILY  180 tablet 2  . glimepiride (AMARYL) 2 MG tablet TAKE ONE TABLET BY MOUTH EVERY MORNING BEFORE BREAKFAST  (Patient taking differently: TAKE HALF OF A TABLET TABLET BY MOUTH EVERY MORNING BEFORE BREAKFAST) 30 tablet 1  . glucose blood test strip One touch Verio test strips Check blood sugar once daily Dx 250.00 100 each 11  . metFORMIN (GLUCOPHAGE) 1000 MG tablet TAKE ONE TABLET BY MOUTH TWICE DAILY WITH MEALS 60 tablet 2  . metoprolol tartrate (LOPRESSOR) 25 MG tablet Take 0.5 tablets (12.5 mg total) by mouth 2 (two) times daily. 30 tablet 6  . montelukast (SINGULAIR) 10 MG tablet Take 1 tablet (10 mg total) by mouth at bedtime. 30 tablet 3  . nitroGLYCERIN (NITROSTAT) 0.4 MG SL tablet Place 0.4 mg under the tongue every 5 (five) minutes as needed for chest pain.    Glory Rosebush DELICA LANCETS FINE MISC Check blood sugar once daily Dx 250.00 100 each 11  . potassium chloride SA (K-DUR,KLOR-CON) 20 MEQ tablet Take 1 tablet (20 mEq total) by mouth 2 (two) times daily. 60 tablet 1  . pregabalin (LYRICA) 75 MG capsule Take 75 mg by mouth 2 (two) times daily.    . psyllium (HYDROCIL/METAMUCIL) 95 % PACK Take 1 packet by mouth 2 (two) times daily.     No current facility-administered medications on file prior to visit.   He is allergic to benazepril; hctz; and aspirin..  Review of Systems Pertinent items are noted in HPI.    Objective:    Oxygen saturation 94% on room air BP 144/72  mmHg  Pulse 74  Temp(Src) 98.4 F (36.9 C) (Oral)  Resp 16  Wt 282 lb 3.2 oz (128.005 kg)  SpO2 94% General appearance: alert, cooperative, appears stated age and no distress Ears: normal TM's and external ear canals both ears Nose: Nares normal. Septum midline. Mucosa normal. No drainage or sinus tenderness. Throat: lips, mucosa, and tongue normal; teeth and gums normal Neck: no adenopathy, supple, symmetrical, trachea midline and thyroid not enlarged, symmetric, no tenderness/mass/nodules Lungs: diminished breath sounds bilaterally Heart: S1, S2 normal    Assessment:    Acute Bronchitis    Plan:    Antibiotics per medication orders. Avoid exposure to tobacco smoke and fumes. Call if shortness of breath worsens, blood in sputum, change in character of cough, development of fever or chills, inability to maintain nutrition and hydration. Avoid exposure to tobacco smoke and fumes. Chest x-ray.  1. Thyroid nodule Check labs - TSH - T3, free - T4, free  2. Type II diabetes mellitus with ophthalmic manifestations not at goal con't  Glimepiride, and metformin - Basic metabolic panel - CBC with Differential - Hemoglobin A1c - POCT urinalysis dipstick - Microalbumin / creatinine urine ratio  3. Essential hypertension con't norvasc,  And metoprolol - POCT urinalysis dipstick - Microalbumin / creatinine urine ratio  4. Hyperlipidemia LDL goal <70 Check labs, con't fenofibrate,  zetia and lipitor - Hepatic function panel - Lipid panel  5. Cough   - DG Chest 2 View; Future  6. Acute bronchitis due to other specified organisms Check xray - azithromycin (ZITHROMAX Z-PAK) 250 MG tablet; As directed  Dispense: 6 each; Refill: 0  7. Acute upper respiratory infection   - azelastine (ASTELIN) 0.1 % nasal spray; Place 1 spray into both nostrils 2 (two) times daily. Use in each nostril as directed  Dispense: 30 mL; Refill: 12

## 2014-06-22 ENCOUNTER — Encounter: Payer: Self-pay | Admitting: Family Medicine

## 2014-06-22 LAB — LIPID PANEL
CHOLESTEROL: 129 mg/dL (ref 0–200)
HDL: 20 mg/dL — AB (ref >39.00)
NonHDL: 109
Total CHOL/HDL Ratio: 6
Triglycerides: 221 mg/dL — ABNORMAL HIGH (ref 0.0–149.0)
VLDL: 44.2 mg/dL — ABNORMAL HIGH (ref 0.0–40.0)

## 2014-06-22 LAB — CBC WITH DIFFERENTIAL/PLATELET
Basophils Absolute: 0 10*3/uL (ref 0.0–0.1)
Basophils Relative: 0.2 % (ref 0.0–3.0)
EOS PCT: 2.8 % (ref 0.0–5.0)
Eosinophils Absolute: 0.2 10*3/uL (ref 0.0–0.7)
HCT: 39.6 % (ref 39.0–52.0)
Hemoglobin: 12.6 g/dL — ABNORMAL LOW (ref 13.0–17.0)
LYMPHS ABS: 1.2 10*3/uL (ref 0.7–4.0)
Lymphocytes Relative: 16.6 % (ref 12.0–46.0)
MCHC: 31.7 g/dL (ref 30.0–36.0)
MCV: 82.3 fl (ref 78.0–100.0)
MONOS PCT: 6 % (ref 3.0–12.0)
Monocytes Absolute: 0.4 10*3/uL (ref 0.1–1.0)
NEUTROS PCT: 74.4 % (ref 43.0–77.0)
Neutro Abs: 5.6 10*3/uL (ref 1.4–7.7)
PLATELETS: 321 10*3/uL (ref 150.0–400.0)
RBC: 4.81 Mil/uL (ref 4.22–5.81)
RDW: 17.1 % — ABNORMAL HIGH (ref 11.5–15.5)
WBC: 7.5 10*3/uL (ref 4.0–10.5)

## 2014-06-22 LAB — TSH: TSH: 1.87 u[IU]/mL (ref 0.35–4.50)

## 2014-06-22 LAB — HEPATIC FUNCTION PANEL
ALBUMIN: 4 g/dL (ref 3.5–5.2)
ALK PHOS: 72 U/L (ref 39–117)
ALT: 26 U/L (ref 0–53)
AST: 30 U/L (ref 0–37)
Bilirubin, Direct: 0.1 mg/dL (ref 0.0–0.3)
Total Bilirubin: 0.4 mg/dL (ref 0.2–1.2)
Total Protein: 7 g/dL (ref 6.0–8.3)

## 2014-06-22 LAB — BASIC METABOLIC PANEL
BUN: 27 mg/dL — AB (ref 6–23)
CALCIUM: 9.1 mg/dL (ref 8.4–10.5)
CO2: 27 mEq/L (ref 19–32)
CREATININE: 1.7 mg/dL — AB (ref 0.4–1.5)
Chloride: 105 mEq/L (ref 96–112)
GFR: 43.03 mL/min — AB (ref >60.00)
GLUCOSE: 89 mg/dL (ref 70–99)
POTASSIUM: 3.9 meq/L (ref 3.5–5.1)
Sodium: 139 mEq/L (ref 135–145)

## 2014-06-22 LAB — T4, FREE: Free T4: 0.87 ng/dL (ref 0.60–1.60)

## 2014-06-22 LAB — MICROALBUMIN / CREATININE URINE RATIO
Creatinine,U: 140.1 mg/dL
Microalb Creat Ratio: 0.3 mg/g (ref 0.0–30.0)
Microalb, Ur: 0.4 mg/dL (ref 0.0–1.9)

## 2014-06-22 LAB — HEMOGLOBIN A1C: HEMOGLOBIN A1C: 7.2 % — AB (ref 4.6–6.5)

## 2014-06-22 LAB — T3, FREE: T3 FREE: 2.5 pg/mL (ref 2.3–4.2)

## 2014-06-22 LAB — LDL CHOLESTEROL, DIRECT: LDL DIRECT: 71.8 mg/dL

## 2014-06-25 ENCOUNTER — Other Ambulatory Visit: Payer: Self-pay | Admitting: Family Medicine

## 2014-06-27 ENCOUNTER — Other Ambulatory Visit: Payer: Self-pay | Admitting: Family Medicine

## 2014-06-27 DIAGNOSIS — E785 Hyperlipidemia, unspecified: Secondary | ICD-10-CM

## 2014-06-27 MED ORDER — FENOFIBRATE 160 MG PO TABS: ORAL_TABLET | ORAL | Status: DC

## 2014-06-27 MED ORDER — FENOFIBRATE 160 MG PO TABS: 160.0000 mg | ORAL_TABLET | Freq: Every day | ORAL | Status: DC

## 2014-06-27 NOTE — Telephone Encounter (Signed)
Please advise on Labs and if the refills are appropriate.   KP

## 2014-06-27 NOTE — Addendum Note (Signed)
Addended by: Ewing Schlein on: 06/27/2014 04:02 PM   Modules accepted: Orders

## 2014-06-28 ENCOUNTER — Telehealth: Payer: Self-pay | Admitting: Family Medicine

## 2014-06-28 ENCOUNTER — Telehealth: Payer: Self-pay

## 2014-06-28 DIAGNOSIS — R799 Abnormal finding of blood chemistry, unspecified: Secondary | ICD-10-CM

## 2014-06-28 DIAGNOSIS — R7989 Other specified abnormal findings of blood chemistry: Secondary | ICD-10-CM

## 2014-06-28 MED ORDER — GLUCOSE BLOOD VI STRP: ORAL_STRIP | Status: DC

## 2014-06-28 MED ORDER — ONETOUCH DELICA LANCETS FINE MISC: Status: DC

## 2014-06-28 MED ORDER — METFORMIN HCL 1000 MG PO TABS: ORAL_TABLET | ORAL | Status: DC

## 2014-06-28 NOTE — Telephone Encounter (Signed)
Spoke with patient and he voiced understanding of the results, he stated that he would increase his metformin and advised that he drinks plenty of fluid, he is unsure of why Creatinine and bun are elevated, he has agreed to come back and repeat. Apt scheduled for 2 weeks.

## 2014-06-28 NOTE — Telephone Encounter (Signed)
-----   Message from Rosalita Chessman, DO sent at 06/27/2014 12:54 PM EST ----- Increase metformin to 1 1/2 tab in am and 1 in pm--- 3 month supply-- dm not controlled Cholesterol--- LDL goal < 70,  HDL >40,  TG < 150.  Diet and exercise will increase HDL and decrease LDL and TG.  Fish,  Fish Oil, Flaxseed oil will also help increase the HDL and decrease Triglycerides.   Recheck labs in 3 months---- con't fenofibrate , zetia and statin. Bun and cr elevated--- inc fluid intake and recheck bmp in 2 weeks.  If no better will need to refer to nephrology.   Lipid,hep, bmp, hgba1c----diabetes uncontrolled , hyperlipidemia

## 2014-06-28 NOTE — Telephone Encounter (Signed)
Caller name: Sustaita, Tymothy L Relation to pt: self  Call back number: [phone] Pharmacy:[facility] [phone]  Reason for call:   Pt states he is completley out of strips requesting a refill glucose blood test strip & ONETOUCH DELICA LANCETS FINE MISC

## 2014-06-28 NOTE — Telephone Encounter (Signed)
Rx sent      KP 

## 2014-07-10 ENCOUNTER — Other Ambulatory Visit: Payer: Self-pay | Admitting: Family Medicine

## 2014-07-12 ENCOUNTER — Other Ambulatory Visit: Payer: Medicare Other

## 2014-07-12 DIAGNOSIS — Z4789 Encounter for other orthopedic aftercare: Secondary | ICD-10-CM | POA: Diagnosis not present

## 2014-07-28 ENCOUNTER — Other Ambulatory Visit: Payer: Self-pay | Admitting: *Deleted

## 2014-07-28 MED ORDER — ATORVASTATIN CALCIUM 80 MG PO TABS: 80.0000 mg | ORAL_TABLET | Freq: Every day | ORAL | Status: DC

## 2014-08-07 ENCOUNTER — Other Ambulatory Visit: Payer: Self-pay | Admitting: Medical

## 2014-08-11 ENCOUNTER — Other Ambulatory Visit: Payer: Self-pay

## 2014-08-11 MED ORDER — FUROSEMIDE 40 MG PO TABS: 40.0000 mg | ORAL_TABLET | Freq: Two times a day (BID) | ORAL | Status: DC

## 2014-08-15 ENCOUNTER — Ambulatory Visit (INDEPENDENT_AMBULATORY_CARE_PROVIDER_SITE_OTHER): Payer: Medicare Other | Admitting: Pulmonary Disease

## 2014-08-15 ENCOUNTER — Encounter: Payer: Self-pay | Admitting: Family Medicine

## 2014-08-15 ENCOUNTER — Ambulatory Visit (INDEPENDENT_AMBULATORY_CARE_PROVIDER_SITE_OTHER): Payer: Medicare Other | Admitting: Family Medicine

## 2014-08-15 ENCOUNTER — Encounter: Payer: Self-pay | Admitting: Pulmonary Disease

## 2014-08-15 ENCOUNTER — Institutional Professional Consult (permissible substitution): Payer: Medicare Other | Admitting: Pulmonary Disease

## 2014-08-15 VITALS — BP 132/62 | HR 85 | Temp 97.9°F | Wt 280.6 lb

## 2014-08-15 VITALS — BP 156/62 | HR 85 | Temp 97.0°F | Ht 72.0 in | Wt 280.0 lb

## 2014-08-15 DIAGNOSIS — G4733 Obstructive sleep apnea (adult) (pediatric): Secondary | ICD-10-CM | POA: Diagnosis not present

## 2014-08-15 DIAGNOSIS — G4730 Sleep apnea, unspecified: Secondary | ICD-10-CM | POA: Diagnosis not present

## 2014-08-15 DIAGNOSIS — Z9989 Dependence on other enabling machines and devices: Principal | ICD-10-CM

## 2014-08-15 NOTE — Progress Notes (Signed)
Subjective:    Patient ID: EYOB WORM, male    DOB: 12/23/1940, 74 y.o.   MRN: [id number]  HPI The patient is a 74 year old male who I've been asked to see for management of obstructive sleep apnea. He tells me that he was diagnosed 10 years ago, and has been on C Pap since that time.  About 5 years ago he was having issues with his sleep, and found to have nocturnal hypoxemia, and therefore oxygen was added to his device during sleep. He feels that he has done very well with this, and is satisfied with the quality of his sleep and is rested in the mornings upon arising. However, the patient quickly becomes sleepy during the day with any period of inactivity. He tells me that he will fall asleep if he sits down for any period of time. His Epworth score today is very abnormal at 21. He is also on quite a bit of medication which can also contribute to daytime sleepiness. His current C Pap device is about 56-67 years old, and to his knowledge it has never been recalibrated. He uses a nasal C Pap mask, and is unsure if he has any issues with mouth opening. He has kept up with his mask cushions, and other supplies. He is unsure of his current C Pap pressure. He does tell me that his weight is up at least 20 pounds since his original sleep study.   Sleep Questionnaire What time do you typically go to bed?( Between what hours) 10pm 10pm at 1352 on 08/15/14 by Maurice March, RN How long does it take you to fall asleep? within 1 min when wearing CPAP within 1 min when wearing CPAP at 1352 on 08/15/14 by Maurice March, RN How many times during the night do you wake up? 1 1 at 1352 on 08/15/14 by Maurice March, RN What time do you get out of bed to start your day? 0700 0700 at 1352 on 08/15/14 by Maurice March, RN Do you drive or operate heavy machinery in your occupation? No No at 1352 on 08/15/14 by Maurice March, RN How much has your weight changed (up or down) over the past two years? (In pounds)  10 lb (4.536 kg) 10 lb (4.536 kg) at 1352 on 08/15/14 by Maurice March, RN Have you ever had a sleep study before? Yes Yes at 1352 on 08/15/14 by Maurice March, RN If yes, location of study? [place] at 1352 on 08/15/14 by Maurice March, RN If yes, date of study? 2005 2005 at 1352 on 08/15/14 by Maurice March, RN Do you currently use CPAP? Yes Yes at 1352 on 08/15/14 by Maurice March, RN If so, what pressure? 5 5 at 1352 on 08/15/14 by Maurice March, RN Do you wear oxygen at any time? Yes Yes at 1352 on 08/15/14 by Maurice March, RN O2 Flow Rate (L/min) 2 L/min   Review of Systems  Constitutional: Negative for fever and unexpected weight change.  HENT: Negative for congestion, dental problem, ear pain, nosebleeds, postnasal drip, rhinorrhea, sinus pressure, sneezing, sore throat and trouble swallowing.   Eyes: Negative for redness and itching.  Respiratory: Negative for cough, chest tightness, shortness of breath and wheezing.   Cardiovascular: Negative for palpitations and leg swelling.  Gastrointestinal: Negative for nausea and vomiting.  Genitourinary: Negative for dysuria.  Musculoskeletal: Positive for arthralgias. Negative for joint swelling.  Skin: Negative for rash.  Neurological:  Negative for headaches.  Hematological: Does not bruise/bleed easily.  Psychiatric/Behavioral: Negative for dysphoric mood. The patient is not nervous/anxious.        Objective:   Physical Exam Constitutional:  Obese male, no acute distress  HENT:  Nares patent without discharge, narrowed bilat  Oropharynx without exudate, palate and uvula are thick and elongated.   Eyes:  Perrla, eomi, no scleral icterus  Neck:  No JVD, no TMG  Cardiovascular:  Normal rate, regular rhythm, no rubs or gallops.  3/6 sem        Intact distal pulses but decreased  Pulmonary :  Normal breath sounds, no stridor or respiratory distress   No rales, rhonchi, or wheezing  Abdominal:   Soft, nondistended, bowel sounds present.  No tenderness noted.   Musculoskeletal:  + lower extremity edema noted.  Lymph Nodes:  No cervical lymphadenopathy noted  Skin:  No cyanosis noted  Neurologic:  Alert, appropriate, moves all 4 extremities without obvious deficit.         Assessment & Plan:

## 2014-08-15 NOTE — Assessment & Plan Note (Signed)
Pt needs new sleep study for recertification of cpap with oxygen Pt to see pulmonary today

## 2014-08-15 NOTE — Progress Notes (Signed)
Subjective:    Patient ID: Richard [name], male    DOB: [birth year], 74 y.o.   MRN: [id number]  HPI  Patient here for f/u  OSA.  Pt needs to be recertified for cpap with O2 with [facility].     Past Medical History  Diagnosis Date  . Gout   . Esophageal stricture     X 4  . Mixed hyperlipidemia   . Peptic stricture of esophagus   . Antritis (stomach)     mild  . Duodenitis     chronic  . Diverticulosis     mild, left colon  . Anemia   . HTN (hypertension)   . Sliding hiatal hernia   . GERD (gastroesophageal reflux disease)   . CAD (coronary artery disease)   . HNP (herniated nucleus pulposus), lumbar   . Nocturia   . Adenomatous colon polyp     tubular  . Vitamin B12 deficiency   . Spinal stenosis   . Skin cancer     "right neck cut off; several frozen off my arms" (03/15/2014)  . Heart murmur   . OSA on CPAP     with 2L O2 at night  . Type II diabetes mellitus   . Migraines     "til age 89 yrs"  . Arthritis     "knees, hips, shoulders" (03/15/2014)  . History of gout   . Chronic lower back pain   . Renal insufficiency   . Wenckebach block     a. brady down to 30s due to frequent block, metoprolol held in response.    Review of Systems  Constitutional: Negative for fatigue and unexpected weight change.  Respiratory: Negative for cough and shortness of breath.   Cardiovascular: Negative for chest pain, palpitations and leg swelling.       + murmur       Objective:    Physical Exam  Constitutional: He is oriented to person, place, and time. He appears well-developed and well-nourished. No distress.  Cardiovascular: Normal rate and regular rhythm.   Murmur heard. Pulmonary/Chest: Effort normal and breath sounds normal. No respiratory distress. He has no wheezes. He has no rales. He exhibits no tenderness.  Neurological: He is alert and oriented to person, place, and time.  Psychiatric: He has a normal mood and affect. His behavior is normal. Judgment and  thought content normal.    BP 132/62 mmHg  Pulse 85  Temp(Src) 97.9 F (36.6 C) (Oral)  Wt 280 lb 9.6 oz (127.279 kg)  SpO2 93% Wt Readings from Last 3 Encounters:  08/15/14 280 lb 9.6 oz (127.279 kg)  06/21/14 282 lb 3.2 oz (128.005 kg)  06/02/14 287 lb 6.4 oz (130.364 kg)     Lab Results  Component Value Date   WBC 7.5 06/21/2014   HGB 12.6* 06/21/2014   HCT 39.6 06/21/2014   PLT 321.0 06/21/2014   GLUCOSE 89 06/21/2014   CHOL 129 06/21/2014   TRIG 221.0* 06/21/2014   HDL 20.00* 06/21/2014   LDLDIRECT 71.8 06/21/2014   LDLCALC 52 12/20/2013   ALT 26 06/21/2014   AST 30 06/21/2014   NA 139 06/21/2014   K 3.9 06/21/2014   CL 105 06/21/2014   CREATININE 1.7* 06/21/2014   BUN 27* 06/21/2014   CO2 27 06/21/2014   TSH 1.87 06/21/2014   PSA 0.65 09/27/2013   INR 1.15 03/16/2014   HGBA1C 7.2* 06/21/2014   MICROALBUR 0.4 06/21/2014    Dg Chest 2 View  06/22/2014  CLINICAL DATA:  Three weeks of cough, congestion, and drainage ; history of CABG and diabetes  EXAM: CHEST  2 VIEW  COMPARISON:  PA and lateral chest of March 29, 2014  FINDINGS: The lungs are well-expanded. The interstitial markings are mildly increased though stable. There is no alveolar infiltrate nor pleural effusion. The heart and pulmonary vascularity are normal. There are post CABG changes. There is degenerative disc change at multiple thoracic levels.  IMPRESSION: Stable coarse interstitial markings. There is no evidence of pneumonia, CHF, nor other acute cardiopulmonary abnormality.   Electronically Signed   By: David  Martinique   On: 06/22/2014 08:21       Assessment & Plan:   Problem List Items Addressed This Visit    Sleep apnea    Pt needs new sleep study for recertification of cpap with oxygen Pt to see pulmonary today       Other Visit Diagnoses    OSA on CPAP    -  Primary    Relevant Orders    Ambulatory referral to Pulmonology        Garnet Koyanagi, DO

## 2014-08-15 NOTE — Progress Notes (Signed)
Pre visit review using our clinic review tool, if applicable. No additional management support is needed unless otherwise documented below in the visit note. 

## 2014-08-15 NOTE — Patient Instructions (Signed)
Will get a download off your machine for last 3 mos to make sure that your sleep apnea is being adequately controlled.  Will call you with the results.  Call us if you do not hear from [place] within one week after the download is done.  Once we know that your sleep apnea is controlled, will then check your oxygen level overnight on cpap but off oxygen to see if you still need.  Work on weight loss.  This will make your sleep apnea better. followup with me again in one year if you are doing well.

## 2014-08-15 NOTE — Assessment & Plan Note (Signed)
The patient has a history of obstructive sleep apnea diagnosed 8-10 years ago, and has done very well overall on his device. Despite sleeping well during the night and feeling rested upon arising, he is now falling asleep anytime he sits down and is inactive. It is unclear if his current C Pap setting is adequately controlling his degree of sleep apnea. He has gained at least 20 pounds since his original sleep study, and does not think that his device has been recalibrated. He also uses a nasal C Pap mask, and is unsure if he has any mouth opening during the night which can lead to pressure loss. I would like to get a download off his current machine for the last 3 months to make sure that his apnea is adequately controlled. If it is, we can then repeat his overnight oximetry on C Pap but off oxygen to meet insurance requirements. Finally, I've encouraged him to work aggressively on weight loss, and would like to see him back on a yearly basis once we have evaluated all of this.

## 2014-08-19 DIAGNOSIS — M1712 Unilateral primary osteoarthritis, left knee: Secondary | ICD-10-CM | POA: Diagnosis not present

## 2014-08-24 ENCOUNTER — Telehealth: Payer: Self-pay | Admitting: Pulmonary Disease

## 2014-08-24 DIAGNOSIS — G4733 Obstructive sleep apnea (adult) (pediatric): Secondary | ICD-10-CM

## 2014-08-24 NOTE — Telephone Encounter (Signed)
Spoke with pt, states he was instructed at last visit to take chip out of his cpap and to mail it to [place].  Pt mailed chip last week and [facility] received the chip last Wednesday.  Pt is calling to get the results of his download.  [place] please advise if you've got the download from this yet, or if we need to re-request this from [place].  Thank you.

## 2014-08-24 NOTE — Telephone Encounter (Signed)
Let pt know that his download just received yest.  Shows great control of his sleep apnea, and no significant mask leak.  His compliance is great as well.  Suspect his daytime sleepiness may be related to his meds, since his sleep apnea appears to be well controlled. As we discussed, he will need ONO ON CPAP, but OFF OXYGEN one night with results sent to me for insurance purposes. Please send order for this.

## 2014-08-24 NOTE — Telephone Encounter (Signed)
Called and spoke with pt and he is aware of results per [facility].  Pt voiced his understanding and is aware of order placed for the ONO to be done by [facility].  Nothing further is needed.

## 2014-09-01 DIAGNOSIS — G4733 Obstructive sleep apnea (adult) (pediatric): Secondary | ICD-10-CM | POA: Diagnosis not present

## 2014-09-05 ENCOUNTER — Telehealth: Payer: Self-pay | Admitting: Pulmonary Disease

## 2014-09-05 DIAGNOSIS — G4733 Obstructive sleep apnea (adult) (pediatric): Secondary | ICD-10-CM

## 2014-09-05 NOTE — Telephone Encounter (Signed)
Results are in [facility] green look at folder and does not return until tomorrow.  Please advise thanks

## 2014-09-06 ENCOUNTER — Other Ambulatory Visit: Payer: Self-pay | Admitting: *Deleted

## 2014-09-06 MED ORDER — POTASSIUM CHLORIDE CRYS ER 20 MEQ PO TBCR: 20.0000 meq | EXTENDED_RELEASE_TABLET | Freq: Two times a day (BID) | ORAL | Status: DC

## 2014-09-07 ENCOUNTER — Other Ambulatory Visit: Payer: Self-pay | Admitting: *Deleted

## 2014-09-07 MED ORDER — EZETIMIBE 10 MG PO TABS: 10.0000 mg | ORAL_TABLET | Freq: Every day | ORAL | Status: DC

## 2014-09-07 NOTE — Telephone Encounter (Signed)
Pt calling back again for result of test can be reached @ [phone].Richard [name]

## 2014-09-07 NOTE — Telephone Encounter (Signed)
Let pt know that his oxygen level on cpap but off oxygen did not show a significant drop in his oxygen level Ok to send order to d/c oxygen

## 2014-09-07 NOTE — Telephone Encounter (Signed)
Patient notified that results have not been reviewed yet.  Patient would like call on cell phone once results have been reviewed.  To [facility] for f/u

## 2014-09-08 NOTE — Telephone Encounter (Signed)
Pt informed of Dr Janifer Adie recommendations.  Order placed to d/c oxygen.

## 2014-09-21 DIAGNOSIS — H2513 Age-related nuclear cataract, bilateral: Secondary | ICD-10-CM | POA: Diagnosis not present

## 2014-09-22 ENCOUNTER — Other Ambulatory Visit: Payer: Self-pay | Admitting: Family Medicine

## 2014-10-03 DIAGNOSIS — M1712 Unilateral primary osteoarthritis, left knee: Secondary | ICD-10-CM | POA: Diagnosis not present

## 2014-10-06 ENCOUNTER — Encounter (HOSPITAL_COMMUNITY): Payer: Self-pay | Admitting: Emergency Medicine

## 2014-10-06 ENCOUNTER — Emergency Department (HOSPITAL_COMMUNITY)
Admission: EM | Admit: 2014-10-06 | Discharge: 2014-10-06 | Disposition: A | Discharge status: Home / Self Care | Payer: Medicare Other | Attending: Emergency Medicine | Admitting: Emergency Medicine

## 2014-10-06 DIAGNOSIS — Z86018 Personal history of other benign neoplasm: Secondary | ICD-10-CM | POA: Diagnosis not present

## 2014-10-06 DIAGNOSIS — I2510 Atherosclerotic heart disease of native coronary artery without angina pectoris: Secondary | ICD-10-CM | POA: Diagnosis not present

## 2014-10-06 DIAGNOSIS — Z951 Presence of aortocoronary bypass graft: Secondary | ICD-10-CM | POA: Diagnosis not present

## 2014-10-06 DIAGNOSIS — Z9861 Coronary angioplasty status: Secondary | ICD-10-CM | POA: Diagnosis not present

## 2014-10-06 DIAGNOSIS — M79604 Pain in right leg: Secondary | ICD-10-CM | POA: Diagnosis present

## 2014-10-06 DIAGNOSIS — I10 Essential (primary) hypertension: Secondary | ICD-10-CM | POA: Insufficient documentation

## 2014-10-06 DIAGNOSIS — Z87448 Personal history of other diseases of urinary system: Secondary | ICD-10-CM | POA: Insufficient documentation

## 2014-10-06 DIAGNOSIS — Z7982 Long term (current) use of aspirin: Secondary | ICD-10-CM | POA: Diagnosis not present

## 2014-10-06 DIAGNOSIS — R011 Cardiac murmur, unspecified: Secondary | ICD-10-CM | POA: Diagnosis not present

## 2014-10-06 DIAGNOSIS — K219 Gastro-esophageal reflux disease without esophagitis: Secondary | ICD-10-CM | POA: Insufficient documentation

## 2014-10-06 DIAGNOSIS — Z862 Personal history of diseases of the blood and blood-forming organs and certain disorders involving the immune mechanism: Secondary | ICD-10-CM | POA: Insufficient documentation

## 2014-10-06 DIAGNOSIS — Z7951 Long term (current) use of inhaled steroids: Secondary | ICD-10-CM | POA: Diagnosis not present

## 2014-10-06 DIAGNOSIS — E119 Type 2 diabetes mellitus without complications: Secondary | ICD-10-CM | POA: Diagnosis not present

## 2014-10-06 DIAGNOSIS — G4762 Sleep related leg cramps: Secondary | ICD-10-CM | POA: Diagnosis not present

## 2014-10-06 DIAGNOSIS — G8929 Other chronic pain: Secondary | ICD-10-CM | POA: Diagnosis not present

## 2014-10-06 DIAGNOSIS — Z9981 Dependence on supplemental oxygen: Secondary | ICD-10-CM | POA: Diagnosis not present

## 2014-10-06 DIAGNOSIS — G4733 Obstructive sleep apnea (adult) (pediatric): Secondary | ICD-10-CM | POA: Diagnosis not present

## 2014-10-06 DIAGNOSIS — E782 Mixed hyperlipidemia: Secondary | ICD-10-CM | POA: Insufficient documentation

## 2014-10-06 DIAGNOSIS — M109 Gout, unspecified: Secondary | ICD-10-CM | POA: Insufficient documentation

## 2014-10-06 DIAGNOSIS — Z9889 Other specified postprocedural states: Secondary | ICD-10-CM | POA: Diagnosis not present

## 2014-10-06 DIAGNOSIS — Z79899 Other long term (current) drug therapy: Secondary | ICD-10-CM | POA: Insufficient documentation

## 2014-10-06 DIAGNOSIS — Z85828 Personal history of other malignant neoplasm of skin: Secondary | ICD-10-CM | POA: Insufficient documentation

## 2014-10-06 LAB — CBC WITH DIFFERENTIAL/PLATELET
BASOS ABS: 0 10*3/uL (ref 0.0–0.1)
Basophils Relative: 0 % (ref 0–1)
EOS PCT: 1 % (ref 0–5)
Eosinophils Absolute: 0.1 10*3/uL (ref 0.0–0.7)
HEMATOCRIT: 39.8 % (ref 39.0–52.0)
Hemoglobin: 12.1 g/dL — ABNORMAL LOW (ref 13.0–17.0)
LYMPHS PCT: 17 % (ref 12–46)
Lymphs Abs: 1.7 10*3/uL (ref 0.7–4.0)
MCH: 24.3 pg — ABNORMAL LOW (ref 26.0–34.0)
MCHC: 30.4 g/dL (ref 30.0–36.0)
MCV: 80.1 fL (ref 78.0–100.0)
MONO ABS: 0.8 10*3/uL (ref 0.1–1.0)
Monocytes Relative: 8 % (ref 3–12)
Neutro Abs: 7.5 10*3/uL (ref 1.7–7.7)
Neutrophils Relative %: 74 % (ref 43–77)
Platelets: 318 10*3/uL (ref 150–400)
RBC: 4.97 MIL/uL (ref 4.22–5.81)
RDW: 16.8 % — AB (ref 11.5–15.5)
WBC: 10.1 10*3/uL (ref 4.0–10.5)

## 2014-10-06 LAB — COMPREHENSIVE METABOLIC PANEL
ALT: 23 U/L (ref 0–53)
ANION GAP: 10 (ref 5–15)
AST: 26 U/L (ref 0–37)
Albumin: 4.1 g/dL (ref 3.5–5.2)
Alkaline Phosphatase: 69 U/L (ref 39–117)
BUN: 23 mg/dL (ref 6–23)
CALCIUM: 9 mg/dL (ref 8.4–10.5)
CO2: 25 mmol/L (ref 19–32)
CREATININE: 1.23 mg/dL (ref 0.50–1.35)
Chloride: 103 mmol/L (ref 96–112)
GFR, EST AFRICAN AMERICAN: 64 mL/min — AB (ref >90)
GFR, EST NON AFRICAN AMERICAN: 55 mL/min — AB (ref >90)
GLUCOSE: 178 mg/dL — AB (ref 70–99)
Potassium: 3.6 mmol/L (ref 3.5–5.1)
Sodium: 138 mmol/L (ref 135–145)
Total Bilirubin: 0.5 mg/dL (ref 0.3–1.2)
Total Protein: 7.2 g/dL (ref 6.0–8.3)

## 2014-10-06 LAB — I-STAT TROPONIN, ED: Troponin i, poc: 0 ng/mL (ref 0.00–0.08)

## 2014-10-06 LAB — URINALYSIS, ROUTINE W REFLEX MICROSCOPIC
Bilirubin Urine: NEGATIVE
Glucose, UA: NEGATIVE mg/dL
Hgb urine dipstick: NEGATIVE
Ketones, ur: NEGATIVE mg/dL
LEUKOCYTES UA: NEGATIVE
Nitrite: NEGATIVE
PROTEIN: NEGATIVE mg/dL
Specific Gravity, Urine: 1.007 (ref 1.005–1.030)
Urobilinogen, UA: 0.2 mg/dL (ref 0.0–1.0)
pH: 6.5 (ref 5.0–8.0)

## 2014-10-06 LAB — MAGNESIUM: Magnesium: 2.2 mg/dL (ref 1.5–2.5)

## 2014-10-06 LAB — CK: Total CK: 138 U/L (ref 7–232)

## 2014-10-06 MED ORDER — SODIUM CHLORIDE 0.9 % IV BOLUS (SEPSIS)
1000.0000 mL | Freq: Once | INTRAVENOUS | Status: AC
  Administered 2014-10-06: 1000 mL via INTRAVENOUS

## 2014-10-06 NOTE — Discharge Instructions (Signed)
Leg Cramps Leg cramps that occur during exercise can be caused by poor circulation or dehydration. However, muscle cramps that occur at rest or during the night are usually not due to any serious medical problem. Heat cramps may cause muscle spasms during hot weather.  CAUSES There is no clear cause for muscle cramps. However, dehydration may be a factor for those who do not drink enough fluids and those who exercise in the heat. Imbalances in the level of sodium, potassium, calcium or magnesium in the muscle tissue may also be a factor. Some medications, such as water pills (diuretics), may cause loss of chemicals that the body needs (like sodium and potassium) and cause muscle cramps. TREATMENT   Make sure your diet has enough fluids and essential minerals for the muscle to work normally.  Avoid strenuous exercise for several days if you have been having frequent leg cramps.  Stretch and massage the cramped muscle for several minutes.  Some medicines may be helpful in some patients with night cramps. Only take over-the-counter or prescription medicines as directed by your caregiver. SEEK IMMEDIATE MEDICAL CARE IF:   Your leg cramps become worse.  Your foot becomes cold, numb, or blue. Document Released: 07/25/2004 Document Revised: 09/09/2011 Document Reviewed: 07/12/2008 Exit[employer] Patient Information 2015 [employer]. This information is not intended to replace advice given to you by your health care provider. Make sure you discuss any questions you have with your health care provider.  

## 2014-10-06 NOTE — ED Provider Notes (Signed)
CSN: [id number]     Arrival date & time 10/06/14  0854 History   First MD Initiated Contact with Patient 10/06/14 [phone]     Chief Complaint  Patient presents with  . Leg Pain     (Consider location/radiation/quality/duration/timing/severity/associated sxs/prior Treatment) HPI Richard [name] is a 74 year old male with past medical history of hypertension, CAD, heart murmur, diabetes who presents the ER complaining of bilateral leg cramps. Patient states his cramps occurred last night, were intermittent, and were worsened with any movement. Patient states walking worsened his cramps, does not identify any alleviating factors. Patient states he has had a similar issues in the past where he was dehydrated. Patient states he plays golf on a daily basis outside, and this is not an unusual activity for him. Patient denies any overuse of his legs, denies any decrease in oral intake or recent illness. Patient denies headache, dizziness, blurred vision, weakness, chest pain, shortness of breath, nausea, vomiting, abdominal pain, leg pain. Patient reports associated leg swelling bilaterally which he states is at baseline for him.  Past Medical History  Diagnosis Date  . Gout   . Esophageal stricture     X 4  . Mixed hyperlipidemia   . Peptic stricture of esophagus   . Antritis (stomach)     mild  . Duodenitis     chronic  . Diverticulosis     mild, left colon  . Anemia   . HTN (hypertension)   . Sliding hiatal hernia   . GERD (gastroesophageal reflux disease)   . CAD (coronary artery disease)   . HNP (herniated nucleus pulposus), lumbar   . Nocturia   . Adenomatous colon polyp     tubular  . Vitamin B12 deficiency   . Spinal stenosis   . Skin cancer     "right neck cut off; several frozen off my arms" (03/15/2014)  . Heart murmur   . OSA on CPAP     with 2L O2 at night  . Type II diabetes mellitus   . Migraines     "til age 89 yrs"  . Arthritis     "knees, hips, shoulders" (03/15/2014)   . History of gout   . Chronic lower back pain   . Renal insufficiency   . Wenckebach block     a. brady down to 30s due to frequent block, metoprolol held in response.   Past Surgical History  Procedure Laterality Date  . Vasectomy    . Coronary artery bypass graft  03/2000    "CABG X5"  . Esophageal dilation  X 3-4    Dr. Lyla Son; "last one was in the 1990's"  . Esophagogastroduodenoscopy      multiple  . Flexible sigmoidoscopy      multiple  . Panendoscopy    . Knee arthroscopy Left 2011    meniscus repair  . Shoulder surgery Right 08/2010    screws placed; "tendons tore off"  . Colonoscopy w/ biopsies and polypectomy  2013  . Upper gi endoscopy  2013    Gastritis; Dr Carlean Purl  . Myelogram  04/06/13    lumbar, Dr Gladstone Lighter  . Lumbar laminectomy/decompression microdiscectomy Right 06/17/2013    Procedure: LUMBAR LAMINECTOMY MICRODISCECTOMY L4-L5 RIGHT EXCISION OF SYNOVIAL CYST RIGHT   (1 LEVEL) RIGHT PARTIAL FACETECTOMY;  Surgeon: Tobi Bastos, MD;  Location: [facility];  Service: Orthopedics;  Laterality: Right;  . Back surgery    . Appendectomy  ~ 1952  . Tonsillectomy  ~ 1954  .  Coronary angioplasty  1993  . Coronary angioplasty with stent placement  05/1997    "1"  . Skin cancer excision Right     "neck"  . Left heart catheterization with coronary/graft angiogram N/A 03/16/2014    Procedure: LEFT HEART CATHETERIZATION WITH Beatrix Fetters;  Surgeon: Burnell Blanks, MD;  Location: [facility] CATH LAB;  Service: Cardiovascular;  Laterality: N/A;   Family History  Problem Relation Age of Onset  . Stroke Father   . Hypertension Father   . Pancreatic cancer Mother   . Diabetes Maternal Grandmother   . Stroke Maternal Grandmother   . Heart attack Paternal Grandmother   . Colon cancer Neg Hx   . Esophageal cancer Neg Hx   . Rectal cancer Neg Hx   . Stomach cancer Neg Hx   . Ulcers Neg Hx    History  Substance Use Topics  . Smoking status: Never Smoker    . Smokeless tobacco: Never Used  . Alcohol Use: 0.0 oz/week    0 Standard drinks or equivalent per week     Comment: "last drink was in 2012"( 03/15/2014)    Review of Systems  Constitutional: Negative for fever.  HENT: Negative for trouble swallowing.   Eyes: Negative for visual disturbance.  Respiratory: Negative for shortness of breath.   Cardiovascular: Negative for chest pain.  Gastrointestinal: Negative for nausea, vomiting and abdominal pain.  Genitourinary: Negative for dysuria.  Musculoskeletal: Negative for neck pain.       Leg cramps  Skin: Negative for rash.  Neurological: Negative for dizziness, weakness and numbness.  Psychiatric/Behavioral: Negative.        Allergies  Benazepril; Hctz; and Aspirin  Home Medications   Prior to Admission medications   Medication Sig Start Date End Date Taking? Authorizing Provider  allopurinol (ZYLOPRIM) 300 MG tablet Take 450 mg by mouth daily.    Yes Historical Provider, MD  amLODipine (NORVASC) 5 MG tablet Take 2 tablets (10 mg total) by mouth daily. 03/17/14  Yes Almyra Deforest, PA  Ascorbic Acid (VITAMIN C) 500 MG tablet Take 500 mg by mouth every morning.    Yes Historical Provider, MD  aspirin 81 MG tablet Take 81 mg by mouth daily.   Yes Historical Provider, MD  atorvastatin (LIPITOR) 80 MG tablet Take 1 tablet (80 mg total) by mouth at bedtime. 07/28/14  Yes Minus Breeding, MD  esomeprazole (NEXIUM) 40 MG capsule Take 1 capsule (40 mg total) by mouth daily. 10/15/12  Yes [name], MD  ezetimibe (ZETIA) 10 MG tablet Take 1 tablet (10 mg total) by mouth daily. 09/07/14  Yes Minus Breeding, MD  fenofibrate 160 MG tablet Take 1 tablet (160 mg total) by mouth daily. 06/27/14  Yes Yvonne R Lowne, DO  fexofenadine (ALLEGRA) 180 MG tablet Take 90 mg by mouth 2 (two) times daily.    Yes Historical Provider, MD  fluticasone (FLONASE) 50 MCG/ACT nasal spray INHALE ONE SPRAY INTO EACH NOSTRIL TWICE DAILY. 09/22/14  Yes Yvonne R Lowne, DO   furosemide (LASIX) 40 MG tablet Take 1 tablet (40 mg total) by mouth 2 (two) times daily. 08/11/14  Yes Minus Breeding, MD  glimepiride (AMARYL) 2 MG tablet TAKE ONE TABLET BY MOUTH EVERY MORNING BEFORE BREAKFAST  Patient taking differently: TAKE ONE TABLET BY MOUTH EVERY MORNING BEFORE BREAKFAST 07/11/14  Yes Alferd Apa Lowne, DO  glucose blood test strip One touch Verio test strips Check blood sugar once daily E11.9 06/28/14  Yes Rosalita Chessman, DO  metFORMIN (GLUCOPHAGE) 1000 MG tablet TAKE 1.5 TABLET BY MOUTH IN THE MORNING AND 1 IN THE EVENING Patient taking differently: Take 1,000-1,500 mg by mouth 2 (two) times daily. TAKE 1.5 TABLET BY MOUTH IN THE MORNING AND 1 IN THE EVENING 06/28/14  Yes Yvonne R Lowne, DO  metoprolol tartrate (LOPRESSOR) 25 MG tablet Take 0.5 tablets (12.5 mg total) by mouth 2 (two) times daily. 04/04/14  Yes Isaiah Serge, NP  nitroGLYCERIN (NITROSTAT) 0.4 MG SL tablet Place 0.4 mg under the tongue every 5 (five) minutes as needed for chest pain.   Yes Historical Provider, MD  [facility] LANCETS FINE MISC Check blood sugar once daily Dx E11.9 06/28/14  Yes Alferd Apa Lowne, DO  potassium chloride SA (K-DUR,KLOR-CON) 20 MEQ tablet Take 1 tablet (20 mEq total) by mouth 2 (two) times daily. 09/06/14  Yes Minus Breeding, MD  pregabalin (LYRICA) 75 MG capsule Take 75 mg by mouth 2 (two) times daily.   Yes Historical Provider, MD  psyllium (HYDROCIL/METAMUCIL) 95 % PACK Take 1 packet by mouth 2 (two) times daily.   Yes Historical Provider, MD  azelastine (ASTELIN) 0.1 % nasal spray Place 1 spray into both nostrils 2 (two) times daily. Use in each nostril as directed 06/21/14   Alferd Apa Lowne, DO  montelukast (SINGULAIR) 10 MG tablet TAKE ONE TABLET BY MOUTH NIGHTLY AT BEDTIME  Patient taking differently: TAKE ONE TABLET BY MOUTH NIGHTLY AT BEDTIME 08/08/14   Meriam Sprague Saguier, PA-C   BP 142/63 mmHg  Pulse 80  Temp(Src) 98.2 F (36.8 C) (Oral)  Resp 18  SpO2 97% Physical Exam   Constitutional: He is oriented to person, place, and time. He appears well-developed and well-nourished. No distress.  HENT:  Head: Normocephalic and atraumatic.  Mouth/Throat: Oropharynx is clear and moist. No oropharyngeal exudate.  Eyes: Right eye exhibits no discharge. Left eye exhibits no discharge. No scleral icterus.  Neck: Normal range of motion.  Cardiovascular: Normal rate, regular rhythm and S2 normal.   Murmur heard.  Systolic murmur is present with a grade of 2/6  Pulmonary/Chest: Effort normal and breath sounds normal. No accessory muscle usage. No tachypnea. No respiratory distress.  Abdominal: Soft. There is no tenderness.  Musculoskeletal: Normal range of motion. He exhibits no edema or tenderness.  Lymphadenopathy:  Bilateral 1+ pitting pedal edema in lower extremities extending up to mid to proximal tibial region.  Neurological: He is alert and oriented to person, place, and time. He has normal strength. No cranial nerve deficit or sensory deficit. Coordination and gait normal. GCS eye subscore is 4. GCS verbal subscore is 5. GCS motor subscore is 6.  Patient fully alert, answering questions appropriately in full, clear sentences. Cranial nerves II-12 grossly intact. Motor strength 5 out of 5 in all major muscle groups of upper and lower extremities. Distal sensation intact.  Skin: Skin is warm and dry. No rash noted. He is not diaphoretic.  Psychiatric: He has a normal mood and affect.  Nursing note and vitals reviewed.   ED Course  Procedures (including critical care time) Labs Review Labs Reviewed  CBC WITH DIFFERENTIAL/PLATELET - Abnormal; Notable for the following:    Hemoglobin 12.1 (*)    MCH 24.3 (*)    RDW 16.8 (*)    All other components within normal limits  COMPREHENSIVE METABOLIC PANEL - Abnormal; Notable for the following:    Glucose, Bld 178 (*)    GFR calc non Af Amer 55 (*)    GFR  calc Af Amer 64 (*)    All other components within normal limits   URINALYSIS, ROUTINE W REFLEX MICROSCOPIC  CK  MAGNESIUM  I-STAT TROPOININ, ED    Imaging Review No results found.   EKG Interpretation   Date/Time:  Thursday October 06 2014 10:19:03 EDT Ventricular Rate:  84 PR Interval:  276 QRS Duration: 95 QT Interval:  403 QTC Calculation: 476 R Axis:   1 Text Interpretation:  Sinus rhythm Ventricular bigeminy Prolonged PR  interval Baseline wander in lead(s) V5 ventricular bigeminy New since  previous tracing Confirmed by JACUBOWITZ  MD, SAM [phone]) on 10/06/2014  10:23:52 AM      MDM   Final diagnoses:  Leg cramps, sleep related    Patient here with leg cramps that occurred last night and have not been persistent, and has not occurred since. Patient states previously he has had an episode similar to this when he was dehydrated and required IV fluids. She was given IV fluids here, states he "feels much better" after limits. Do not see any evidence of patient having overt dehydration. There is no evidence of end organ damage or electrolyte dysfunction or abnormality. Patient hemodynamically stable, ambulating well throughout the ED without difficulty or having any leg cramps. Patient does have evidence of bigeminy noted on EKG, however patient states his cardiologist has pointed this out to him past. Episodes of bigeminy are transient, and patient has recurrent PVCs that are asymptomatic, frequent, however there are no runs of V. tach. The PVCs perfuse well. Patient does not have any cardiac complaints since last night or while being here. No concern for muscle breakdown, rhabdomyolysis. Patient with normal neuro exam, no neurologic deficits. No concern for DVT. Patient discharged with instruction to follow with his primary care provider. Return precautions discussed, patient verbalizes understanding and agreement of this plan. I encouraged patient to call or return to ER should he have any questions or concerns.  BP 142/63 mmHg  Pulse 80   Temp(Src) 98.2 F (36.8 C) (Oral)  Resp 18  SpO2 97%  Signed,  Dahlia Bailiff, PA-C 5:15 PM  Patient discussed with Dr. Francine Graven, MD  Dahlia Bailiff, PA-C 10/06/14 [place], DO 10/09/14 [phone]

## 2014-10-06 NOTE — ED Notes (Signed)
Pt able to ambulate without difficulties, denies leg cramping.

## 2014-10-06 NOTE — ED Notes (Signed)
Pt c/o intermittent bilateral leg cramps throughout the night last night.  States that this morning, both his "calves locked up".  Pt denies any cramping now but states that this happened about a year ago and he had to get 3 bags of fluid because he was dehydrated.

## 2014-10-11 ENCOUNTER — Other Ambulatory Visit: Payer: Self-pay | Admitting: Family Medicine

## 2014-10-11 ENCOUNTER — Other Ambulatory Visit: Payer: Self-pay | Admitting: Cardiology

## 2014-10-11 NOTE — Telephone Encounter (Signed)
Rx refill sent to patient pharmacy   

## 2014-10-15 ENCOUNTER — Other Ambulatory Visit: Payer: Self-pay | Admitting: Cardiology

## 2014-10-18 ENCOUNTER — Other Ambulatory Visit: Payer: Self-pay | Admitting: Cardiology

## 2014-10-18 ENCOUNTER — Other Ambulatory Visit: Payer: Self-pay | Admitting: Physician Assistant

## 2014-10-18 ENCOUNTER — Other Ambulatory Visit: Payer: Self-pay | Admitting: Medical

## 2014-10-18 ENCOUNTER — Other Ambulatory Visit: Payer: Self-pay | Admitting: Family Medicine

## 2014-10-19 NOTE — Telephone Encounter (Signed)
error:[id number] ° °

## 2014-10-24 DIAGNOSIS — M1A00X0 Idiopathic chronic gout, unspecified site, without tophus (tophi): Secondary | ICD-10-CM | POA: Diagnosis not present

## 2014-11-15 ENCOUNTER — Other Ambulatory Visit: Payer: Self-pay | Admitting: Cardiology

## 2014-11-15 ENCOUNTER — Other Ambulatory Visit: Payer: Self-pay | Admitting: Family Medicine

## 2014-11-15 NOTE — Telephone Encounter (Signed)
Rx(s) sent to pharmacy electronically.  

## 2014-11-22 DIAGNOSIS — M545 Low back pain: Secondary | ICD-10-CM | POA: Diagnosis not present

## 2014-11-29 ENCOUNTER — Ambulatory Visit (INDEPENDENT_AMBULATORY_CARE_PROVIDER_SITE_OTHER): Payer: Medicare Other | Admitting: Cardiology

## 2014-11-29 ENCOUNTER — Encounter: Payer: Self-pay | Admitting: Cardiology

## 2014-11-29 VITALS — BP 149/85 | HR 91 | Ht 72.0 in | Wt 278.0 lb

## 2014-11-29 DIAGNOSIS — I2510 Atherosclerotic heart disease of native coronary artery without angina pectoris: Secondary | ICD-10-CM | POA: Diagnosis not present

## 2014-11-29 DIAGNOSIS — I779 Disorder of arteries and arterioles, unspecified: Secondary | ICD-10-CM

## 2014-11-29 DIAGNOSIS — I739 Peripheral vascular disease, unspecified: Secondary | ICD-10-CM

## 2014-11-29 DIAGNOSIS — I25810 Atherosclerosis of coronary artery bypass graft(s) without angina pectoris: Secondary | ICD-10-CM | POA: Diagnosis not present

## 2014-11-29 NOTE — Progress Notes (Signed)
HPI The patient presents for follow up of coronary artery disease. He has had bypass several years ago. He was in the hospital last September with some dizziness. The patient underwent cardiac catheterization on 03/16/2014 which showed 100% mid LAD chronic occlusion, with mid to distal vessel filled with patent LIMA graft, diffuse 90% stenosis in OM1, complete occlusion in OM 2, patent sequential SVG to OM1/OM 2, and 99% proximal RCA stenosis with 100% mid RCA occlusion, patent LIMA to RCA, patient does have occluded SVG to diagonal branch. With 4 out 5 of previous bypass grafts were still patent medical management was recommended.  He did have bradycardia and was taken off his beta blocker. When he came back to see his heart rate was increased and he was restarted on a low dose of beta blocker.   Since I last saw him he's had no new problems. I do see that he was in the emergency room for leg cramping which bothers him. Magnesium and potassium were normal. This was in April. He doesn't exercise as much as I would like because of joint problems.  The patient denies any new symptoms such as chest discomfort, neck or arm discomfort. There has been no new shortness of breath, PND or orthopnea. There have been no reported palpitations, presyncope or syncope.   Allergies  Allergen Reactions  . Benazepril     angioedema; he is not a candidate for any angiotensin receptor blockers because of this significant allergic reaction. Because of a history of documented adverse serious drug reaction;Medi Alert bracelet  is recommended  . Hctz [Hydrochlorothiazide] Swelling    Tongue and lip swelling   . Aspirin Other (See Comments)    Gastritis, cant take 325 Mg aspirin     Current Outpatient Prescriptions  Medication Sig Dispense Refill  . allopurinol (ZYLOPRIM) 300 MG tablet Take 450 mg by mouth daily.     [name] amLODipine (NORVASC) 5 MG tablet TAKE TWO TABLETS BY MOUTH DAILY 60 tablet 1  . Ascorbic Acid  (VITAMIN C) 500 MG tablet Take 500 mg by mouth every morning.     [name] aspirin 81 MG tablet Take 81 mg by mouth daily.    [name] atorvastatin (LIPITOR) 80 MG tablet TAKE ONE TABLET BY MOUTH AT BEDTIME 90 tablet 1  . esomeprazole (NEXIUM) 40 MG capsule Take 1 capsule (40 mg total) by mouth daily. 30 capsule 5  . fenofibrate 160 MG tablet TAKE ONE TABLET BY MOUTH ONE TIME DAILY 30 tablet 2  . fexofenadine (ALLEGRA) 180 MG tablet Take 90 mg by mouth 2 (two) times daily.     . fluticasone (FLONASE) 50 MCG/ACT nasal spray INHALE ONE SPRAY INTO EACH NOSTRIL TWICE DAILY. 16 g 6  . furosemide (LASIX) 40 MG tablet Take 1 tablet (40 mg total) by mouth 2 (two) times daily. 180 tablet 2  . glimepiride (AMARYL) 2 MG tablet TAKE ONE TABLET BY MOUTH EVERY MORNING BEFORE BREAKFAST  (Patient taking differently: TAKE ONE TABLET BY MOUTH EVERY MORNING BEFORE BREAKFAST) 30 tablet 5  . glucose blood test strip One touch Verio test strips Check blood sugar once daily E11.9 100 each 11  . metFORMIN (GLUCOPHAGE) 1000 MG tablet Take 1.5 tablets in the morning and 1 tablet in the evening--- Repeat labs are due now 225 tablet 0  . metoprolol tartrate (LOPRESSOR) 25 MG tablet TAKE HALF TABLET BY MOUTH TWICE DAILY 30 tablet 6  . montelukast (SINGULAIR) 10 MG tablet TAKE ONE TABLET BY MOUTH NIGHTLY  AT BEDTIME 30 tablet 2  . nitroGLYCERIN (NITROSTAT) 0.4 MG SL tablet Place 0.4 mg under the tongue every 5 (five) minutes as needed for chest pain.    Glory Rosebush DELICA LANCETS FINE MISC Check blood sugar once daily Dx E11.9 100 each 11  . potassium chloride SA (K-DUR,KLOR-CON) 20 MEQ tablet TAKE ONE TABLET BY MOUTH TWICE DAILY 60 tablet 5  . pregabalin (LYRICA) 75 MG capsule Take 75 mg by mouth 2 (two) times daily.    . psyllium (HYDROCIL/METAMUCIL) 95 % PACK Take 1 packet by mouth 2 (two) times daily.    [name] ZETIA 10 MG tablet TAKE ONE TABLET BY MOUTH DAILY 30 tablet 5   No current facility-administered medications for this visit.     Past Medical History  Diagnosis Date  . Gout   . Esophageal stricture     X 4  . Mixed hyperlipidemia   . Peptic stricture of esophagus   . Antritis (stomach)     mild  . Duodenitis     chronic  . Diverticulosis     mild, left colon  . Anemia   . HTN (hypertension)   . Sliding hiatal hernia   . GERD (gastroesophageal reflux disease)   . CAD (coronary artery disease)   . HNP (herniated nucleus pulposus), lumbar   . Nocturia   . Adenomatous colon polyp     tubular  . Vitamin B12 deficiency   . Spinal stenosis   . Skin cancer     "right neck cut off; several frozen off my arms" (03/15/2014)  . Heart murmur   . OSA on CPAP     with 2L O2 at night  . Type II diabetes mellitus   . Migraines     "til age 83 yrs"  . Arthritis     "knees, hips, shoulders" (03/15/2014)  . History of gout   . Chronic lower back pain   . Renal insufficiency   . Wenckebach block     a. brady down to 30s due to frequent block, metoprolol held in response.    Past Surgical History  Procedure Laterality Date  . Vasectomy    . Coronary artery bypass graft  03/2000    "CABG X5"  . Esophageal dilation  X 3-4    Dr. Lyla Son; "last one was in the 1990's"  . Esophagogastroduodenoscopy      multiple  . Flexible sigmoidoscopy      multiple  . Panendoscopy    . Knee arthroscopy Left 2011    meniscus repair  . Shoulder surgery Right 08/2010    screws placed; "tendons tore off"  . Colonoscopy w/ biopsies and polypectomy  2013  . Upper gi endoscopy  2013    Gastritis; Dr Carlean Purl  . Myelogram  04/06/13    lumbar, Dr Gladstone Lighter  . Lumbar laminectomy/decompression microdiscectomy Right 06/17/2013    Procedure: LUMBAR LAMINECTOMY MICRODISCECTOMY L4-L5 RIGHT EXCISION OF SYNOVIAL CYST RIGHT   (1 LEVEL) RIGHT PARTIAL FACETECTOMY;  Surgeon: Tobi Bastos, MD;  Location: [facility];  Service: Orthopedics;  Laterality: Right;  . Back surgery    . Appendectomy  ~ 1952  . Tonsillectomy  ~ 1954  . Coronary  angioplasty  1993  . Coronary angioplasty with stent placement  05/1997    "1"  . Skin cancer excision Right     "neck"  . Left heart catheterization with coronary/graft angiogram N/A 03/16/2014    Procedure: LEFT HEART CATHETERIZATION WITH CORONARY/GRAFT ANGIOGRAM;  Surgeon:  Burnell Blanks, MD;  Location: [facility] CATH LAB;  Service: Cardiovascular;  Laterality: N/A;    ROS:  As stated in the HPI and negative for all other systems.  PHYSICAL EXAM BP 149/85 mmHg  Pulse 91  Ht 6' (1.829 m)  Wt 278 lb (126.1 kg)  BMI 37.70 kg/m2 GENERAL:  Well appearing NECK:  No jugular venous distention, waveform within normal limits, carotid upstroke brisk and symmetric, bilateral bruits versus transmitted systolic murmur bruits, no thyromegaly LYMPHATICS:  No cervical, inguinal adenopathy LUNGS:  Clear to auscultation bilaterally BACK:  No CVA tenderness CHEST:  Well healed sternotomy scar. HEART:  PMI not displaced or sustained,S1 and S2 within normal limits, no S3, no S4, no clicks, no rubs, apical systolic murmur radiating out the aortic outflow tract and possibly increase with a strain phase of Valsalva, no diastolic murmurs ABD:  Flat, positive bowel sounds normal in frequency in pitch, no bruits, no rebound, no guarding, no midline pulsatile mass, no hepatomegaly, no splenomegaly, obese EXT:  2 plus pulses throughout, trace bilateral lower extremityedema, no cyanosis no clubbing  ASSESSMENT AND PLAN  CAD:  The patient has no new sypmtoms.  No further cardiovascular testing is indicated.  We will continue with aggressive risk reduction and meds as listed.  MURMUR:  he has tricuspid regurgitation noted on echo. No change in therapy or further imaging is indicated.   BRADYCARDIA:  This seems to be improved with a low dose of beta blocker only. He will continue with this.  CAROTID STENOSIS:   patient has moderate stenosis and will have follow-up Dopplers in November.  DYSLIPIDEMIA:   He will  keep the meds as listed.  His HDL is very low but his LDL is at target. I think he needs to still be on the statin and Xetia.  DM:  This is being followed by Garnet Koyanagi, DO .   The A1c was 7.2.  OBESITY:  We talked about diet and exercise.

## 2014-11-29 NOTE — Patient Instructions (Signed)
Your physician recommends that you schedule a follow-up appointment in: one year with Dr. Percival Spanish  We have ordered a doppler for you to get done on November

## 2014-12-14 ENCOUNTER — Ambulatory Visit (INDEPENDENT_AMBULATORY_CARE_PROVIDER_SITE_OTHER): Payer: Medicare Other | Admitting: Internal Medicine

## 2014-12-14 ENCOUNTER — Encounter: Payer: Self-pay | Admitting: Internal Medicine

## 2014-12-14 VITALS — BP 132/74 | HR 87 | Temp 97.7°F | Ht 72.0 in | Wt 278.5 lb

## 2014-12-14 DIAGNOSIS — R197 Diarrhea, unspecified: Secondary | ICD-10-CM

## 2014-12-14 DIAGNOSIS — I25810 Atherosclerosis of coronary artery bypass graft(s) without angina pectoris: Secondary | ICD-10-CM

## 2014-12-14 NOTE — Patient Instructions (Signed)
Drink plenty of fluids  Pepto-Bismol OTC if needed for diarrhea  Continue following a bland diet with soup and crackers, gradually go back to a normal diet  If you are eating very little solids, decrease your blood sugar medications to half doses temporarily   If you have severe symptoms, fever, chills, increased pain, blood in the stools or you are not improving in the next few days: Call the office

## 2014-12-14 NOTE — Progress Notes (Signed)
Pre visit review using our clinic review tool, if applicable. No additional management support is needed unless otherwise documented below in the visit note. 

## 2014-12-14 NOTE — Progress Notes (Signed)
Subjective:    Patient ID: Richard [name], male    DOB: 24-Oct-1940, 74 y.o.   MRN: [id number]  DOS:  12/14/2014 Type of visit - description : acute visit Interval history:  Patient is a 74 year old male in today c/o 3 days of nausea and diarrhea. Patient states he ate [facility] on Sunday and felt nauseous afterwards, which was then compounded by diarrhea the next morning. The diarrhea and nausea have continued through today and are accompanied by cramping lower abdominal pain and mild epigastric discomfort. Denies any vomiting, hematochezia, melena, constipation, or headaches. Notes no fever or chills. Does mention weakness in his legs, patient believes this is due to not eating much lately because of a loss of appetite.   Review of Systems  Constitutional: No fever. No chills.   Respiratory: No wheezing, no  difficulty breathing.   Cardiovascular: No CP, no leg swelling  GI: As per HPI    GU: No dysuria, gross hematuria, difficulty urinating. No urinary urgency.  Neurological: No dizziness or headaches. No diplopia.   Past Medical History  Diagnosis Date  . Gout   . Esophageal stricture     X 4  . Mixed hyperlipidemia   . Peptic stricture of esophagus   . Antritis (stomach)     mild  . Duodenitis     chronic  . Diverticulosis     mild, left colon  . Anemia   . HTN (hypertension)   . Sliding hiatal hernia   . GERD (gastroesophageal reflux disease)   . CAD (coronary artery disease)   . HNP (herniated nucleus pulposus), lumbar   . Adenomatous colon polyp     tubular  . Vitamin B12 deficiency   . Spinal stenosis   . Skin cancer     "right neck cut off; several frozen off my arms" (03/15/2014)  . OSA on CPAP     with 2L O2 at night  . Type II diabetes mellitus   . Migraines     "til age 82 yrs"  . Arthritis     "knees, hips, shoulders" (03/15/2014)  . History of gout   . Chronic lower back pain   . Renal insufficiency   . Wenckebach block     a. brady down to 30s due  to frequent block, metoprolol held in response.    Past Surgical History  Procedure Laterality Date  . Vasectomy    . Coronary artery bypass graft  03/2000    "CABG X5"  . Esophageal dilation  X 3-4    Dr. Lyla Son; "last one was in the 1990's"  . Esophagogastroduodenoscopy      multiple  . Flexible sigmoidoscopy      multiple  . Panendoscopy    . Knee arthroscopy Left 2011    meniscus repair  . Shoulder surgery Right 08/2010    screws placed; "tendons tore off"  . Colonoscopy w/ biopsies and polypectomy  2013  . Upper gi endoscopy  2013    Gastritis; Dr Carlean Purl  . Myelogram  04/06/13    lumbar, Dr Gladstone Lighter  . Lumbar laminectomy/decompression microdiscectomy Right 06/17/2013    Procedure: LUMBAR LAMINECTOMY MICRODISCECTOMY L4-L5 RIGHT EXCISION OF SYNOVIAL CYST RIGHT   (1 LEVEL) RIGHT PARTIAL FACETECTOMY;  Surgeon: Tobi Bastos, MD;  Location: [facility];  Service: Orthopedics;  Laterality: Right;  . Back surgery    . Appendectomy  ~ 1952  . Tonsillectomy  ~ 1954  . Coronary angioplasty  1993  .  Coronary angioplasty with stent placement  05/1997    "1"  . Skin cancer excision Right     "neck"  . Left heart catheterization with coronary/graft angiogram N/A 03/16/2014    Procedure: LEFT HEART CATHETERIZATION WITH Beatrix Fetters;  Surgeon: Burnell Blanks, MD;  Location: [facility] CATH LAB;  Service: Cardiovascular;  Laterality: N/A;    History   Social History  . Marital Status: Divorced    Spouse Name: N/A  . Number of Children: 2  . Years of Education: N/A   Occupational History  .     Social History Main Topics  . Smoking status: Never Smoker   . Smokeless tobacco: Never Used  . Alcohol Use: 0.0 oz/week    0 Standard drinks or equivalent per week     Comment: "last drink was in 2012"( 03/15/2014)  . Drug Use: No  . Sexual Activity: Not Currently   Other Topics Concern  . Not on file   Social History Narrative   Divorced, lives with a roommate.   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     Family History  Problem Relation Age of Onset  . Stroke Father   . Hypertension Father   . Pancreatic cancer Mother   . Diabetes Maternal Grandmother   . Stroke Maternal Grandmother   . Heart attack Paternal Grandmother   . Colon cancer Neg Hx   . Esophageal cancer Neg Hx   . Rectal cancer Neg Hx   . Stomach cancer Neg Hx   . Ulcers Neg Hx        Medication List       This list is accurate as of: 12/14/14  1:34 PM.  Always use your most recent med list.               allopurinol 300 MG tablet  Commonly known as:  ZYLOPRIM  Take 450 mg by mouth daily.     amLODipine 5 MG tablet  Commonly known as:  NORVASC  TAKE TWO TABLETS BY MOUTH DAILY     aspirin 81 MG tablet  Take 81 mg by mouth daily.     atorvastatin 80 MG tablet  Commonly known as:  LIPITOR  TAKE ONE TABLET BY MOUTH AT BEDTIME     esomeprazole 40 MG capsule  Commonly known as:  NEXIUM  Take 1 capsule (40 mg total) by mouth daily.     fenofibrate 160 MG tablet  TAKE ONE TABLET BY MOUTH ONE TIME DAILY     fexofenadine 180 MG tablet  Commonly known as:  ALLEGRA  Take 90 mg by mouth 2 (two) times daily.     fluticasone 50 MCG/ACT nasal spray  Commonly known as:  FLONASE  INHALE ONE SPRAY INTO EACH NOSTRIL TWICE DAILY.     furosemide 40 MG tablet  Commonly known as:  LASIX  Take 1 tablet (40 mg total) by mouth 2 (two) times daily.     glimepiride 2 MG tablet  Commonly known as:  AMARYL  TAKE ONE TABLET BY MOUTH EVERY MORNING BEFORE BREAKFAST     glucose blood test strip  One touch Verio test strips Check blood sugar once daily E11.9     metFORMIN 1000 MG tablet  Commonly known as:  GLUCOPHAGE  Take 1.5 tablets in the  morning and 1 tablet in the evening--- Repeat labs are due now      metoprolol tartrate 25 MG tablet  Commonly known as:  LOPRESSOR  TAKE HALF TABLET BY MOUTH TWICE DAILY     montelukast 10 MG tablet  Commonly known as:  SINGULAIR  TAKE ONE TABLET BY MOUTH NIGHTLY AT BEDTIME     nitroGLYCERIN 0.4 MG SL tablet  Commonly known as:  NITROSTAT  Place 0.4 mg under the tongue every 5 (five) minutes as needed for chest pain.     ONETOUCH DELICA LANCETS FINE Misc  Check blood sugar once daily Dx E11.9     potassium chloride SA 20 MEQ tablet  Commonly known as:  K-DUR,KLOR-CON  TAKE ONE TABLET BY MOUTH TWICE DAILY     pregabalin 75 MG capsule  Commonly known as:  LYRICA  Take 75 mg by mouth 2 (two) times daily.     psyllium 95 % Pack  Commonly known as:  HYDROCIL/METAMUCIL  Take 1 packet by mouth 2 (two) times daily.     vitamin C 500 MG tablet  Commonly known as:  ASCORBIC ACID  Take 500 mg by mouth every morning.     ZETIA 10 MG tablet  Generic drug:  ezetimibe  TAKE ONE TABLET BY MOUTH DAILY           Objective:   Physical Exam BP 132/74 mmHg  Pulse 87  Temp(Src) 97.7 F (36.5 C) (Oral)  Ht 6' (1.829 m)  SpO2 95%  General:   Well developed, well nourished . NAD.  HEENT:  Normocephalic . Face symmetric, atraumatic Lungs:  CTA B Normal respiratory effort, no intercostal retractions, no accessory muscle use. Heart: RRR,  no murmur.  no pretibial edema bilaterally  Abdomen:  Not distended, soft. No rebound or rigidity. No mass,organomegaly.  slt TTP at [facility] area ,  LLQ and along left side of abdomen.  Skin: Not pale. Not jaundice Neurologic:  alert & oriented X3.  Speech normal, gait appropriate for age and unassisted Psych--  Cognition and judgment appear intact.  Cooperative with normal attention span and concentration.  Behavior appropriate. No anxious or depressed appearing.     Assessment & Plan:   (Patient seen along with Aletta Edouard, medical student)  Diarrhea Patient has a 3 day history of  nausea , lower abdominal cramps and non-bloody diarrhea .  Since then, any food causes an upset stomach with diarrhea, including bland foods like crackers.   Likely viral gastroenteritis , less likely  diverticulitis or infectious etiology given time-course and lack of red-flag symptoms Plan: Patient counseled on importance of hydration and maintaining a bland diet with gradual incorporation of other foods. Patient instructed to adjust sugar medication appropriately.  Patient to let us know if symptoms persist or worsen.

## 2014-12-15 ENCOUNTER — Other Ambulatory Visit (INDEPENDENT_AMBULATORY_CARE_PROVIDER_SITE_OTHER): Payer: Medicare Other

## 2014-12-15 ENCOUNTER — Telehealth: Payer: Self-pay | Admitting: Family Medicine

## 2014-12-15 DIAGNOSIS — R197 Diarrhea, unspecified: Secondary | ICD-10-CM

## 2014-12-15 NOTE — Telephone Encounter (Signed)
Caller name: Shayne Relation to pt: self Call back number: [phone] Pharmacy:  Reason for call:   Patient states that he saw Dr. Larose Kells regarding diarrhea. Patient states that his stool is now dark in color. He says that his stomach is still hurting.

## 2014-12-15 NOTE — Telephone Encounter (Signed)
Spoke with Pt, informed him of Dr. Larose Kells recommendations. Pt stated he would come to office to collect containers in about 10 to 20 minutes. Also, informed Pt that Dr. Larose Kells would like to check CBC in his blood, Pt verbalized understanding. Pt placed on lab schedule for 2:30 PM. Also informed Pt that if his pain is severe, he has fever, chills, or noticeable blood in stools, he needs to go to ED. Pt verbalized understanding.

## 2014-12-15 NOTE — Telephone Encounter (Signed)
Please advise 

## 2014-12-15 NOTE — Telephone Encounter (Signed)
Advise patient: Check a CBC, DX right diarrhea We need to test his stools: Provide a container, get Hemoccults, sent for culture and WBCs -- dx diarrhea  If symptoms severe, fever, chills, blood in the stools: Go to the ER

## 2014-12-16 ENCOUNTER — Other Ambulatory Visit: Payer: Self-pay | Admitting: Internal Medicine

## 2014-12-16 DIAGNOSIS — R197 Diarrhea, unspecified: Secondary | ICD-10-CM

## 2014-12-16 DIAGNOSIS — R1084 Generalized abdominal pain: Secondary | ICD-10-CM

## 2014-12-16 LAB — CBC WITH DIFFERENTIAL/PLATELET
Basophils Absolute: 0 10*3/uL (ref 0.0–0.1)
Basophils Relative: 0.3 % (ref 0.0–3.0)
EOS PCT: 2.2 % (ref 0.0–5.0)
Eosinophils Absolute: 0.2 10*3/uL (ref 0.0–0.7)
HCT: 37 % — ABNORMAL LOW (ref 39.0–52.0)
HEMOGLOBIN: 11.9 g/dL — AB (ref 13.0–17.0)
LYMPHS ABS: 1.3 10*3/uL (ref 0.7–4.0)
Lymphocytes Relative: 15.7 % (ref 12.0–46.0)
MCHC: 32.2 g/dL (ref 30.0–36.0)
MCV: 73.8 fl — AB (ref 78.0–100.0)
MONO ABS: 0.4 10*3/uL (ref 0.1–1.0)
Monocytes Relative: 5.6 % (ref 3.0–12.0)
NEUTROS ABS: 6.2 10*3/uL (ref 1.4–7.7)
Neutrophils Relative %: 76.2 % (ref 43.0–77.0)
PLATELETS: 310 10*3/uL (ref 150.0–400.0)
RBC: 5.02 Mil/uL (ref 4.22–5.81)
RDW: 19.4 % — AB (ref 11.5–15.5)
WBC: 8.1 10*3/uL (ref 4.0–10.5)

## 2014-12-16 NOTE — Addendum Note (Signed)
Addended by: Modena Morrow D on: 12/16/2014 11:38 AM   Modules accepted: Orders

## 2014-12-17 ENCOUNTER — Other Ambulatory Visit: Payer: Self-pay | Admitting: Cardiology

## 2014-12-17 ENCOUNTER — Other Ambulatory Visit: Payer: Self-pay | Admitting: Family Medicine

## 2014-12-17 LAB — FECAL LACTOFERRIN, QUANT: Lactoferrin: NEGATIVE

## 2014-12-17 LAB — C. DIFFICILE GDH AND TOXIN A/B
C. difficile GDH: NOT DETECTED
C. difficile Toxin A/B: NOT DETECTED

## 2014-12-19 ENCOUNTER — Other Ambulatory Visit: Payer: Self-pay

## 2014-12-19 ENCOUNTER — Other Ambulatory Visit: Payer: Self-pay | Admitting: Family Medicine

## 2014-12-19 MED ORDER — METOPROLOL TARTRATE 25 MG PO TABS: 12.5000 mg | ORAL_TABLET | Freq: Two times a day (BID) | ORAL | Status: DC

## 2014-12-19 MED ORDER — AMLODIPINE BESYLATE 5 MG PO TABS: 10.0000 mg | ORAL_TABLET | Freq: Every day | ORAL | Status: DC

## 2014-12-20 LAB — STOOL CULTURE

## 2014-12-21 ENCOUNTER — Ambulatory Visit: Payer: Medicare Other | Admitting: Internal Medicine

## 2014-12-21 NOTE — Addendum Note (Signed)
Addended by: Wilfrid Lund on: 12/21/2014 08:04 AM   Modules accepted: Orders

## 2014-12-26 ENCOUNTER — Other Ambulatory Visit: Payer: Self-pay

## 2014-12-30 ENCOUNTER — Encounter: Payer: Self-pay | Admitting: *Deleted

## 2015-01-03 ENCOUNTER — Ambulatory Visit (INDEPENDENT_AMBULATORY_CARE_PROVIDER_SITE_OTHER): Payer: Medicare Other | Admitting: Physician Assistant

## 2015-01-03 ENCOUNTER — Encounter: Payer: Self-pay | Admitting: Physician Assistant

## 2015-01-03 VITALS — BP 162/76 | HR 64 | Ht 72.0 in | Wt 281.4 lb

## 2015-01-03 DIAGNOSIS — Z86010 Personal history of colonic polyps: Secondary | ICD-10-CM

## 2015-01-03 DIAGNOSIS — R197 Diarrhea, unspecified: Secondary | ICD-10-CM | POA: Diagnosis not present

## 2015-01-03 DIAGNOSIS — R1314 Dysphagia, pharyngoesophageal phase: Secondary | ICD-10-CM

## 2015-01-03 DIAGNOSIS — K219 Gastro-esophageal reflux disease without esophagitis: Secondary | ICD-10-CM

## 2015-01-03 NOTE — Progress Notes (Signed)
Patient ID: Richard [name], male   DOB: 11-23-1940, 74 y.o.   MRN: [id number]   Subjective:    Patient ID: Richard [name], male    DOB: [birth year], 74 y.o.   MRN: [id number]  HPI Byard is a pleasant 74 year old white male known to Dr. Carlean Purl. He is currently referred by Dr. Jeraldine Loots for evaluation of diarrhea and also has complaints of dysphagia. Patient has history of coronary artery disease is status post CABG, hypertension, adult-onset diabetes mellitus, hyperlipidemia, joint disease and sleep apnea. He had undergone colonoscopy in October 2013 with finding of moderate left-sided diverticulosis and had 3 polyps removed all of which were tubular adenomas. He is due for follow-up later this year. His TTG and IgA were negative. Patient also had EGD done in 2013 which was negative. Patient says he started having problems with diarrhea about 3 months ago which was occurring daily after breakfast. He says he would often have a normal bowel movement first thing in the morning and then about an hour after eating breakfast would have an urgent episode of loose stool. He would be fine the remainder of the day. He did have his Glucophage dosage increased about 4 months ago. He reports that 23 days ago he started having problems with diarrhea and has had ongoing issues ever since. He had not had any associated fever or chills no recent antibiotics has had some mild abdominal discomfort worst was having 4-5 loose bowel movements per day which were nonbloody. His appetite is been okay but he feels that almost anything other than very bland foods soups etc. was aggravating his diarrhea. He was seen by primary care had labs done which showed hemoglobin 11.9 hematocrit of 37 MCV of 73 stool for C. difficile was negative stool culture negative and stool for lactoferrin also negative. He takes align chronically and has continued. Over the past week or so is been feeling a bit better back to having a fairly normal bowel  movement early in the morning but having 2-3 loose stools later in the day and still having postprandial episodes. This is distressing to him because he plays golf on a daily basis and was unable to play for about 2 weeks. Sliding He also reports 1 month history of solid food dysphagia increased belching and burping. Is not had any episodes of regurgitation but feels that his food is sitting in his esophagus and sticking after eating. He is on Nexium for chronic GERD.  Review of Systems Pertinent positive and negative review of systems were noted in the above HPI section.  All other review of systems was otherwise negative.  Outpatient Encounter Prescriptions as of 01/03/2015  Medication Sig  . allopurinol (ZYLOPRIM) 300 MG tablet Take 450 mg by mouth daily.   [name] amLODipine (NORVASC) 5 MG tablet Take 2 tablets (10 mg total) by mouth daily.  . Ascorbic Acid (VITAMIN C) 500 MG tablet Take 500 mg by mouth every morning.   [name] aspirin 81 MG tablet Take 81 mg by mouth daily.  [name] atorvastatin (LIPITOR) 80 MG tablet TAKE ONE TABLET BY MOUTH AT BEDTIME  . esomeprazole (NEXIUM) 40 MG capsule Take 1 capsule (40 mg total) by mouth daily.  . fenofibrate 160 MG tablet TAKE 1 TABLET BY MOUTH EVERY DAY  . fluticasone (FLONASE) 50 MCG/ACT nasal spray INHALE ONE SPRAY INTO EACH NOSTRIL TWICE DAILY.  . furosemide (LASIX) 40 MG tablet Take 1 tablet (40 mg total) by mouth 2 (two) times daily.  [name]  glimepiride (AMARYL) 2 MG tablet TAKE ONE TABLET BY MOUTH EVERY MORNING BEFORE BREAKFAST  . glucose blood test strip One touch Verio test strips Check blood sugar once daily E11.9  . metFORMIN (GLUCOPHAGE) 1000 MG tablet TAKE ONE AND ONE-HALF TABLETS BY MOUTH EVERY MORNING AND 1 TALBET IN THE EVENING.  . metoprolol tartrate (LOPRESSOR) 25 MG tablet Take 0.5 tablets (12.5 mg total) by mouth 2 (two) times daily.  . montelukast (SINGULAIR) 10 MG tablet TAKE ONE TABLET BY MOUTH NIGHTLY AT BEDTIME  . nitroGLYCERIN (NITROSTAT) 0.4 MG SL  tablet Place 0.4 mg under the tongue every 5 (five) minutes as needed for chest pain.  Glory Rosebush DELICA LANCETS FINE MISC Check blood sugar once daily Dx E11.9  . potassium chloride SA (K-DUR,KLOR-CON) 20 MEQ tablet TAKE ONE TABLET BY MOUTH TWICE DAILY  . pregabalin (LYRICA) 75 MG capsule Take 75 mg by mouth 2 (two) times daily.  . psyllium (HYDROCIL/METAMUCIL) 95 % PACK Take 1 packet by mouth 2 (two) times daily.  [name] ZETIA 10 MG tablet TAKE ONE TABLET BY MOUTH DAILY  . [DISCONTINUED] fexofenadine (ALLEGRA) 180 MG tablet Take 90 mg by mouth 2 (two) times daily.    No facility-administered encounter medications on file as of 01/03/2015.   Allergies  Allergen Reactions  . Benazepril     angioedema; he is not a candidate for any angiotensin receptor blockers because of this significant allergic reaction. Because of a history of documented adverse serious drug reaction;Medi Alert bracelet  is recommended  . Hctz [Hydrochlorothiazide] Swelling    Tongue and lip swelling   . Aspirin Other (See Comments)    Gastritis, cant take 325 Mg aspirin    Patient Active Problem List   Diagnosis Date Noted  . Sinusitis, acute maxillary 04/21/2014  . Costochondritis 03/29/2014  . Wenckebach block   . DOE (dyspnea on exertion) 03/15/2014  . Lightheadedness 03/15/2014  . Undiagnosed cardiac murmurs 09/27/2013  . Acute bronchitis 09/27/2013  . Elevated alkaline phosphatase level 09/27/2013  . Spinal stenosis, lumbar region, with neurogenic claudication 06/17/2013  . Synovial cyst of lumbar facet joint 06/17/2013  . Type II or unspecified type diabetes mellitus with neurological manifestations, uncontrolled 05/21/2013  . Spinal stenosis of lumbar region 04/08/2013  . ARI (acute respiratory infection) 02/03/2013  . Personal history of adenomatous colonic polyps 05/05/2012  . Anemia, iron deficiency 11/29/2011  . B12 deficiency anemia 07/30/2011  . OSA (obstructive sleep apnea) 07/06/2010  .  DEGENERATIVE JOINT DISEASE, LEFT KNEE 03/20/2010  . PARESTHESIA 11/02/2009  . CAD, ARTERY BYPASS GRAFT 08/22/2009  . BRONCHITIS, OBSTRUCTIVE CHRONIC 08/15/2009  . HYPERTENSION, BENIGN 03/29/2009  . EDEMA 02/21/2009  . HYPERPLASIA PROSTATE UNS W/UR OBST & OTH LUTS 12/12/2008  . NOCTURIA 12/12/2008  . HYPERLIPIDEMIA, MIXED 11/26/2008  . RHINITIS 10/10/2008  . GERD, SEVERE 08/15/2008  . GOUT 08/17/2006   History   Social History  . Marital Status: Divorced    Spouse Name: N/A  . Number of Children: 2  . Years of Education: N/A   Occupational History  .     Social History Main Topics  . Smoking status: Never Smoker   . Smokeless tobacco: Never Used  . Alcohol Use: 0.0 oz/week    0 Standard drinks or equivalent per week     Comment: "last drink was in 2012"( 03/15/2014)  . Drug Use: No  . Sexual Activity: Not Currently   Other Topics Concern  . Not on file   Social History Narrative  Divorced, lives with a roommate.   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    Mr. Bockus family history includes Diabetes in his maternal grandmother; Heart attack in his paternal grandmother; Hypertension in his father; Pancreatic cancer in his mother; Stroke in his father and maternal grandmother. There is no history of Colon cancer, Esophageal cancer, Rectal cancer, Stomach cancer, or Ulcers.      Objective:    Filed Vitals:   01/03/15 1409  BP: 162/76  Pulse: 64    Physical Exam  well-developed elderly white male in no acute distress, pleasant blood pressure 162/76 pulse 64, height 6 foot, weight 281 and a BMI 38.1. HEENT; nontraumatic normocephalic EOMI PERRLA sclera anicteric, Supple; no JVD, Cardiovascular; regular rate and rhythm with S1-S2 sternal incisional scar, Pulmonary ;clear bilaterally, Abdomen  ;obese soft minimally tender in the left lower quadrant there is no guarding or rebound no palpable mass or hepatosplenomegaly bowel sounds are present, Rectal exam not done, Extremities ;no clubbing cyanosis or edema skin warm and dry, Neuropsych; mood and affect appropriate       Assessment & Plan:   #1 74 yo male with 3 month hx of post breakfast diarrhea , and 3 week hx of more persistent  diarrhea  with 4-5 Bm's daily. Recent stool studies and stool for lactoferrin negative . I suspect the onset of post breakfast diarrhea may be related to increase dosage of Glucophage, more recent three-week history of diarrhea, etiology not clear infectious workup negative, may have been viral with IBS overlay. #2 history of tubular adenomatous colon polyps due for follow-up this year #3 solid food dysphagia rule out esophageal stricture #4 chronic GERD #5 coronary artery disease status post CABG #6 obstructive sleep apnea #7 hypertension #8 adult-onset diabetes mellitus #9 mild anemia microcytic  Plan; continue daily probiotic currently using align Start Imodium one by mouth every morning on arising Schedule for colonoscopy and EGD with possible dilation with Dr. Carlean Purl. Procedures discussed in detail with patient and he is agreeable to proceed  Alfredia Ferguson PA-C 01/03/2015   Cc: Rosalita Chessman, DO

## 2015-01-03 NOTE — Patient Instructions (Signed)
Continue Align, 1 cap daily. Take Imodium, one daily when you get up.   You have been scheduled for a colonoscopy and Endoscopy. Please follow written instructions given to you at your visit today.  Please pick up your prep supplies at the pharmacy within the next 1-3 days. If you use inhalers (even only as needed), please bring them with you on the day of your procedure. Your physician has requested that you go to www.startemmi.com and enter the access code given to you at your visit today. This web site gives a general overview about your procedure. However, you should still follow specific instructions given to you by our office regarding your preparation for the procedure.

## 2015-01-04 NOTE — Progress Notes (Signed)
Agree with Ms. Esterwood's assessment and plan. Nadim Malia E. Thiago Ragsdale, MD, FACG   

## 2015-01-20 DIAGNOSIS — M1712 Unilateral primary osteoarthritis, left knee: Secondary | ICD-10-CM | POA: Diagnosis not present

## 2015-01-20 DIAGNOSIS — M25571 Pain in right ankle and joints of right foot: Secondary | ICD-10-CM | POA: Diagnosis not present

## 2015-01-24 DIAGNOSIS — S93491A Sprain of other ligament of right ankle, initial encounter: Secondary | ICD-10-CM | POA: Diagnosis not present

## 2015-02-06 DIAGNOSIS — S93491D Sprain of other ligament of right ankle, subsequent encounter: Secondary | ICD-10-CM | POA: Diagnosis not present

## 2015-02-09 DIAGNOSIS — Z23 Encounter for immunization: Secondary | ICD-10-CM | POA: Diagnosis not present

## 2015-02-11 ENCOUNTER — Other Ambulatory Visit: Payer: Self-pay | Admitting: Medical

## 2015-02-13 NOTE — Telephone Encounter (Signed)
rx refill of singulair. Sent to pt pharmacy.

## 2015-02-20 ENCOUNTER — Encounter: Payer: Self-pay | Admitting: Internal Medicine

## 2015-02-20 ENCOUNTER — Ambulatory Visit (AMBULATORY_SURGERY_CENTER): Payer: Medicare Other | Admitting: Internal Medicine

## 2015-02-20 VITALS — BP 128/75 | HR 76 | Temp 97.4°F | Resp 19 | Ht 73.0 in | Wt 281.0 lb

## 2015-02-20 DIAGNOSIS — R1310 Dysphagia, unspecified: Secondary | ICD-10-CM

## 2015-02-20 DIAGNOSIS — D649 Anemia, unspecified: Secondary | ICD-10-CM | POA: Diagnosis not present

## 2015-02-20 DIAGNOSIS — E669 Obesity, unspecified: Secondary | ICD-10-CM | POA: Diagnosis not present

## 2015-02-20 DIAGNOSIS — K222 Esophageal obstruction: Secondary | ICD-10-CM

## 2015-02-20 DIAGNOSIS — I2510 Atherosclerotic heart disease of native coronary artery without angina pectoris: Secondary | ICD-10-CM | POA: Diagnosis not present

## 2015-02-20 DIAGNOSIS — I10 Essential (primary) hypertension: Secondary | ICD-10-CM | POA: Diagnosis not present

## 2015-02-20 DIAGNOSIS — Z86010 Personal history of colonic polyps: Secondary | ICD-10-CM

## 2015-02-20 DIAGNOSIS — R197 Diarrhea, unspecified: Secondary | ICD-10-CM | POA: Diagnosis not present

## 2015-02-20 DIAGNOSIS — E119 Type 2 diabetes mellitus without complications: Secondary | ICD-10-CM | POA: Diagnosis not present

## 2015-02-20 LAB — GLUCOSE, CAPILLARY
GLUCOSE-CAPILLARY: 109 mg/dL — AB (ref 65–99)
Glucose-Capillary: 89 mg/dL (ref 65–99)

## 2015-02-20 MED ORDER — SODIUM CHLORIDE 0.9 % IV SOLN: 500.0000 mL | INTRAVENOUS | Status: DC

## 2015-02-20 NOTE — Patient Instructions (Addendum)
I stretched the esophagus so you can swallow better. No polyps in the colon!  Please tale 1 Imodium AD each day to regulate the diarrhea.  You do not need another routine colonoscopy.  I appreciate the opportunity to care for you. Gatha Mayer, MD, FACG     YOU HAD AN ENDOSCOPIC PROCEDURE TODAY AT [facility]:   Refer to the procedure report that was given to you for any specific questions about what was found during the examination.  If the procedure report does not answer your questions, please call your gastroenterologist to clarify.  If you requested that your care partner not be given the details of your procedure findings, then the procedure report has been included in a sealed envelope for you to review at your convenience later.  YOU SHOULD EXPECT: Some feelings of bloating in the abdomen. Passage of more gas than usual.  Walking can help get rid of the air that was put into your GI tract during the procedure and reduce the bloating. If you had a lower endoscopy (such as a colonoscopy or flexible sigmoidoscopy) you may notice spotting of blood in your stool or on the toilet paper. If you underwent a bowel prep for your procedure, you may not have a normal bowel movement for a few days.  Please Note:  You might notice some irritation and congestion in your nose or some drainage.  This is from the oxygen used during your procedure.  There is no need for concern and it should clear up in a day or so.  SYMPTOMS TO REPORT IMMEDIATELY:   Following lower endoscopy (colonoscopy or flexible sigmoidoscopy):  Excessive amounts of blood in the stool  Significant tenderness or worsening of abdominal pains  Swelling of the abdomen that is new, acute  Fever of 100F or higher   Following upper endoscopy (EGD)  Vomiting of blood or coffee ground material  New chest pain or pain under the shoulder blades  Painful or persistently difficult swallowing  New shortness  of breath  Fever of 100F or higher  Black, tarry-looking stools  For urgent or emergent issues, a gastroenterologist can be reached at any hour by calling [phone].   DIET: Follow Dilation Handout   ACTIVITY:  You should plan to take it easy for the rest of today and you should NOT DRIVE or use heavy machinery until tomorrow (because of the sedation medicines used during the test).    FOLLOW UP: Our staff will call the number listed on your records the next business day following your procedure to check on you and address any questions or concerns that you may have regarding the information given to you following your procedure. If we do not reach you, we will leave a message.  However, if you are feeling well and you are not experiencing any problems, there is no need to return our call.  We will assume that you have returned to your regular daily activities without incident.  If any biopsies were taken you will be contacted by phone or by letter within the next 1-3 weeks.  Please call us at [phone] if you have not heard about the biopsies in 3 weeks.    SIGNATURES/CONFIDENTIALITY: You and/or your care partner have signed paperwork which will be entered into your electronic medical record.  These signatures attest to the fact that that the information above on your After Visit Summary has been reviewed and is understood.  Full responsibility  of the confidentiality of this discharge information lies with you and/or your care-partner.   Resume medications. GERD, Dilation  dietand diverticulosis given.

## 2015-02-20 NOTE — Progress Notes (Signed)
To recovery, report to Brown, RN, VSS. 

## 2015-02-20 NOTE — Progress Notes (Signed)
Called to room to assist during endoscopic procedure.  Patient ID and intended procedure confirmed with present staff. Received instructions for my participation in the procedure from the performing physician.  

## 2015-02-20 NOTE — Op Note (Signed)
[place]  [address]. [place], [address]   ENDOSCOPY PROCEDURE REPORT  PATIENT: Wing, Gaines  MR#: [id number] BIRTHDATE: 04-29-1941 , 74  yrs. old GENDER: male ENDOSCOPIST: Gatha Mayer, MD, [facility] PROCEDURE DATE:  02/20/2015 PROCEDURE:  Esophagoscopy w/ balloon dilation ASA CLASS:     Class III INDICATIONS:  dysphagia. MEDICATIONS: Propofol 100 mg IV and Monitored anesthesia care TOPICAL ANESTHETIC: none  DESCRIPTION OF PROCEDURE: After the risks benefits and alternatives of the procedure were thoroughly explained, informed consent was obtained.  The [id number] endoscope was introduced through the mouth and advanced to the stomach antrum , Without limitations.  The instrument was slowly withdrawn as the mucosa was fully examined.    1) Ring-like esophageal stricture - GE junction - dilated 18 mm no heme so further disrupted with biopsy forceps 2) Small hiatal hernia 3) Otherwise normal.  Retroflexed views revealed as previously described.     The scope was then withdrawn from the patient and the procedure completed.  COMPLICATIONS: There were no immediate complications.  ENDOSCOPIC IMPRESSION: 1) Ring-like esophageal stricture - GE junction - dilated 18 mm no heme so further disrupted with biopsy forceps 2) Small hiatal hernia 3) Otherwise normal  RECOMMENDATIONS: 1.  Continue PPI 2.  Clear liquids until 5 PM  , then soft foods rest oof day. Resume prior diet tomorrow. 3. Colonoscopy next    eSigned:  Gatha Mayer, MD, [facility] 02/20/2015 3:47 PM    CC: The Patient

## 2015-02-20 NOTE — Op Note (Signed)
[place]  [address]. [place], [address]   COLONOSCOPY PROCEDURE REPORT  PATIENT: Edoardo, Erstad  MR#: [id number] BIRTHDATE: 02/23/1941 , 74  yrs. old GENDER: male ENDOSCOPIST: Gatha Mayer, MD, [facility] PROCEDURE DATE:  02/20/2015 PROCEDURE:   Colonoscopy, surveillance First Screening Colonoscopy - Avg.  risk and is 50 yrs.  old or older - No.  Prior Negative Screening - Now for repeat screening. N/A  History of Adenoma - Now for follow-up colonoscopy & has been > or = to 3 yrs.  Yes hx of adenoma.  Has been 3 or more years since last colonoscopy.  Polyps removed today? No Recommend repeat exam, <10 yrs? No ASA CLASS:   Class III INDICATIONS:Surveillance due to prior colonic neoplasia and PH Colon Adenoma. MEDICATIONS: Propofol 120 mg IV, Monitored anesthesia care, and Residual sedation present  DESCRIPTION OF PROCEDURE:   After the risks benefits and alternatives of the procedure were thoroughly explained, informed consent was obtained.  The digital rectal exam revealed no abnormalities of the rectum, revealed no prostatic nodules, and revealed the prostate was not enlarged.   The LB [id number] endoscope was introduced through the anus and advanced to the cecum, which was identified by both the appendix and ileocecal valve. No adverse events experienced.   The quality of the prep was good.  (MiraLax was used)  The instrument was then slowly withdrawn as the colon was fully examined. Estimated blood loss is zero unless otherwise noted in this procedure report.      COLON FINDINGS: There was diverticulosis noted in the left colon. The examination was otherwise normal.  Retroflexed views revealed no abnormalities. The time to cecum = 6.0 Withdrawal time = 6.6 The scope was withdrawn and the procedure completed. COMPLICATIONS: There were no immediate complications.  ENDOSCOPIC IMPRESSION: 1.   Diverticulosis was noted in the left colon 2.   The  examination was otherwise normal - good prep  RECOMMENDATIONS: 1.  Routine repeat colonoscopy screening not necessary.  See me/GI as needed. 2.  Imodium AD every AM  eSigned:  Gatha Mayer, MD, [facility] 02/20/2015 3:55 PM   cc:  The Patient

## 2015-02-21 ENCOUNTER — Telehealth: Payer: Self-pay | Admitting: *Deleted

## 2015-02-21 NOTE — Telephone Encounter (Signed)
  Follow up Call-  Call back number 02/20/2015  Post procedure Call Back phone  # [phone]  Permission to leave phone message Yes     Patient questions:  Message left to call us if necessary.

## 2015-03-13 DIAGNOSIS — S93491D Sprain of other ligament of right ankle, subsequent encounter: Secondary | ICD-10-CM | POA: Diagnosis not present

## 2015-03-20 ENCOUNTER — Ambulatory Visit (INDEPENDENT_AMBULATORY_CARE_PROVIDER_SITE_OTHER): Payer: Medicare Other | Admitting: Family Medicine

## 2015-03-20 ENCOUNTER — Encounter: Payer: Self-pay | Admitting: Family Medicine

## 2015-03-20 VITALS — BP 140/62 | HR 90 | Temp 98.7°F | Wt 275.6 lb

## 2015-03-20 DIAGNOSIS — I10 Essential (primary) hypertension: Secondary | ICD-10-CM

## 2015-03-20 DIAGNOSIS — E669 Obesity, unspecified: Secondary | ICD-10-CM | POA: Diagnosis not present

## 2015-03-20 DIAGNOSIS — E119 Type 2 diabetes mellitus without complications: Secondary | ICD-10-CM | POA: Diagnosis not present

## 2015-03-20 DIAGNOSIS — H65192 Other acute nonsuppurative otitis media, left ear: Secondary | ICD-10-CM | POA: Diagnosis not present

## 2015-03-20 DIAGNOSIS — H6122 Impacted cerumen, left ear: Secondary | ICD-10-CM

## 2015-03-20 DIAGNOSIS — I25810 Atherosclerosis of coronary artery bypass graft(s) without angina pectoris: Secondary | ICD-10-CM | POA: Diagnosis not present

## 2015-03-20 DIAGNOSIS — E782 Mixed hyperlipidemia: Secondary | ICD-10-CM

## 2015-03-20 DIAGNOSIS — H6692 Otitis media, unspecified, left ear: Secondary | ICD-10-CM | POA: Insufficient documentation

## 2015-03-20 DIAGNOSIS — H918X2 Other specified hearing loss, left ear: Secondary | ICD-10-CM

## 2015-03-20 DIAGNOSIS — E1169 Type 2 diabetes mellitus with other specified complication: Secondary | ICD-10-CM

## 2015-03-20 LAB — POCT URINALYSIS DIPSTICK
Bilirubin, UA: NEGATIVE
Blood, UA: NEGATIVE
Glucose, UA: NEGATIVE
Ketones, UA: NEGATIVE
LEUKOCYTES UA: NEGATIVE
NITRITE UA: NEGATIVE
PH UA: 6
PROTEIN UA: NEGATIVE
Spec Grav, UA: 1.025
UROBILINOGEN UA: 0.2

## 2015-03-20 LAB — CBC WITH DIFFERENTIAL/PLATELET
Basophils Absolute: 0 10*3/uL (ref 0.0–0.1)
Basophils Relative: 0.2 % (ref 0.0–3.0)
EOS PCT: 2.5 % (ref 0.0–5.0)
Eosinophils Absolute: 0.3 10*3/uL (ref 0.0–0.7)
HEMATOCRIT: 40.9 % (ref 39.0–52.0)
HEMOGLOBIN: 12.8 g/dL — AB (ref 13.0–17.0)
LYMPHS ABS: 1.7 10*3/uL (ref 0.7–4.0)
LYMPHS PCT: 13.8 % (ref 12.0–46.0)
MCHC: 31.4 g/dL (ref 30.0–36.0)
MCV: 74.9 fl — AB (ref 78.0–100.0)
MONOS PCT: 7.5 % (ref 3.0–12.0)
Monocytes Absolute: 0.9 10*3/uL (ref 0.1–1.0)
Neutro Abs: 9.4 10*3/uL — ABNORMAL HIGH (ref 1.4–7.7)
Neutrophils Relative %: 76 % (ref 43.0–77.0)
Platelets: 360 10*3/uL (ref 150.0–400.0)
RBC: 5.46 Mil/uL (ref 4.22–5.81)
RDW: 19.6 % — ABNORMAL HIGH (ref 11.5–15.5)
WBC: 12.3 10*3/uL — AB (ref 4.0–10.5)

## 2015-03-20 LAB — BASIC METABOLIC PANEL
BUN: 22 mg/dL (ref 6–23)
CO2: 30 mEq/L (ref 19–32)
Calcium: 10 mg/dL (ref 8.4–10.5)
Chloride: 101 mEq/L (ref 96–112)
Creatinine, Ser: 1.21 mg/dL (ref 0.40–1.50)
GFR: 61.86 mL/min (ref >60.00)
GLUCOSE: 92 mg/dL (ref 70–99)
POTASSIUM: 4.3 meq/L (ref 3.5–5.1)
SODIUM: 141 meq/L (ref 135–145)

## 2015-03-20 LAB — LIPID PANEL
CHOLESTEROL: 146 mg/dL (ref 0–200)
HDL: 36.6 mg/dL — ABNORMAL LOW (ref >39.00)
NonHDL: 109.34
Total CHOL/HDL Ratio: 4
Triglycerides: 243 mg/dL — ABNORMAL HIGH (ref 0.0–149.0)
VLDL: 48.6 mg/dL — AB (ref 0.0–40.0)

## 2015-03-20 LAB — LDL CHOLESTEROL, DIRECT: LDL DIRECT: 95 mg/dL

## 2015-03-20 LAB — HEMOGLOBIN A1C: Hgb A1c MFr Bld: 7 % — ABNORMAL HIGH (ref 4.6–6.5)

## 2015-03-20 LAB — TSH: TSH: 2.07 u[IU]/mL (ref 0.35–4.50)

## 2015-03-20 MED ORDER — GLIMEPIRIDE 2 MG PO TABS: 2.0000 mg | ORAL_TABLET | Freq: Every day | ORAL | Status: DC

## 2015-03-20 MED ORDER — ATORVASTATIN CALCIUM 80 MG PO TABS: 80.0000 mg | ORAL_TABLET | Freq: Every day | ORAL | Status: DC

## 2015-03-20 MED ORDER — EZETIMIBE 10 MG PO TABS: 10.0000 mg | ORAL_TABLET | Freq: Every day | ORAL | Status: DC

## 2015-03-20 MED ORDER — METFORMIN HCL 1000 MG PO TABS: ORAL_TABLET | ORAL | Status: DC

## 2015-03-20 MED ORDER — AMOXICILLIN-POT CLAVULANATE 875-125 MG PO TABS: 1.0000 | ORAL_TABLET | Freq: Two times a day (BID) | ORAL | Status: DC

## 2015-03-20 MED ORDER — FENOFIBRATE 160 MG PO TABS: 160.0000 mg | ORAL_TABLET | Freq: Every day | ORAL | Status: DC

## 2015-03-20 MED ORDER — METOPROLOL TARTRATE 25 MG PO TABS: 12.5000 mg | ORAL_TABLET | Freq: Two times a day (BID) | ORAL | Status: DC

## 2015-03-20 MED ORDER — FUROSEMIDE 40 MG PO TABS: 40.0000 mg | ORAL_TABLET | Freq: Two times a day (BID) | ORAL | Status: DC

## 2015-03-20 NOTE — Assessment & Plan Note (Signed)
IRRIGATED SUCCESSFULLY

## 2015-03-20 NOTE — Progress Notes (Signed)
Pre visit review using our clinic review tool, if applicable. No additional management support is needed unless otherwise documented below in the visit note. 

## 2015-03-20 NOTE — Patient Instructions (Signed)
Otitis Media Otitis media is redness, soreness, and inflammation of the middle ear. Otitis media may be caused by allergies or, most commonly, by infection. Often it occurs as a complication of the common cold. SIGNS AND SYMPTOMS Symptoms of otitis media may include:  Earache.  Fever.  Ringing in your ear.  Headache.  Leakage of fluid from the ear. DIAGNOSIS To diagnose otitis media, your health care provider will examine your ear with an otoscope. This is an instrument that allows your health care provider to see into your ear in order to examine your eardrum. Your health care provider also will ask you questions about your symptoms. TREATMENT  Typically, otitis media resolves on its own within 3-5 days. Your health care provider may prescribe medicine to ease your symptoms of pain. If otitis media does not resolve within 5 days or is recurrent, your health care provider may prescribe antibiotic medicines if he or she suspects that a bacterial infection is the cause. HOME CARE INSTRUCTIONS   If you were prescribed an antibiotic medicine, finish it all even if you start to feel better.  Take medicines only as directed by your health care provider.  Keep all follow-up visits as directed by your health care provider. SEEK MEDICAL CARE IF:  You have otitis media only in one ear, or bleeding from your nose, or both.  You notice a lump on your neck.  You are not getting better in 3-5 days.  You feel worse instead of better. SEEK IMMEDIATE MEDICAL CARE IF:   You have pain that is not controlled with medicine.  You have swelling, redness, or pain around your ear or stiffness in your neck.  You notice that part of your face is paralyzed.  You notice that the bone behind your ear (mastoid) is tender when you touch it. MAKE SURE YOU:   Understand these instructions.  Will watch your condition.  Will get help right away if you are not doing well or get worse. Document Released:  03/22/2004 Document Revised: 11/01/2013 Document Reviewed: 01/12/2013 ExitCare Patient Information 2015 [employer]. This information is not intended to replace advice given to you by your health care provider. Make sure you discuss any questions you have with your health care provider.  

## 2015-03-20 NOTE — Progress Notes (Signed)
Patient ID: Richard [name], male    DOB: Nov 23, 1940  Age: 74 y.o. MRN: [id number]    Subjective:  Subjective HPI JAYMEN CHIMA presents for uri symptoms and sore throat x 1 week.   Cough is productive.  No fever.  Has taken his allegra only.  L ear is hurting and L lymph node is swollen.     Review of Systems  Constitutional: Positive for chills. Negative for fever, diaphoresis, appetite change, fatigue and unexpected weight change.  HENT: Positive for congestion, postnasal drip and rhinorrhea. Negative for sinus pressure.   Eyes: Negative for pain, redness and visual disturbance.  Respiratory: Positive for cough. Negative for chest tightness, shortness of breath and wheezing.   Cardiovascular: Negative for chest pain, palpitations and leg swelling.  Endocrine: Negative for cold intolerance, heat intolerance, polydipsia, polyphagia and polyuria.  Genitourinary: Negative for dysuria, frequency and difficulty urinating.  Allergic/Immunologic: Negative for environmental allergies.  Neurological: Negative for dizziness, light-headedness, numbness and headaches.    History Past Medical History  Diagnosis Date  . Gout   . Esophageal stricture     X 4  . Mixed hyperlipidemia   . Peptic stricture of esophagus   . Antritis (stomach)     mild  . Duodenitis     chronic  . Diverticulosis     mild, left colon  . Anemia   . HTN (hypertension)   . Sliding hiatal hernia   . GERD (gastroesophageal reflux disease)   . CAD (coronary artery disease)   . HNP (herniated nucleus pulposus), lumbar   . Adenomatous colon polyp 05/01/2015    tubular  . Vitamin B12 deficiency   . Spinal stenosis   . Skin cancer     "right neck cut off; several frozen off my arms" (03/15/2014)  . OSA on CPAP     with 2L O2 at night  . Type II diabetes mellitus   . Migraines     "til age 1 yrs"  . Arthritis     "knees, hips, shoulders" (03/15/2014)  . History of gout   . Chronic lower back pain   . Renal  insufficiency   . Wenckebach block     a. brady down to 30s due to frequent block, metoprolol held in response.    He has past surgical history that includes Vasectomy; Coronary artery bypass graft (03/2000); Esophageal dilation (X 3-4); Esophagogastroduodenoscopy; Flexible sigmoidoscopy; Panendoscopy; Knee arthroscopy (Left, 2011); Shoulder surgery (Right, 08/2010); Colonoscopy w/ biopsies and polypectomy (2013); Upper gi endoscopy (2013); Myelogram (04/06/13); Lumbar laminectomy/decompression microdiscectomy (Right, 06/17/2013); Back surgery; Appendectomy (~ 1952); Tonsillectomy (~ 1954); Coronary angioplasty (1993); Coronary angioplasty with stent (05/1997); Skin cancer excision (Right); and left heart catheterization with coronary/graft angiogram (N/A, 03/16/2014).   His family history includes Diabetes in his maternal grandmother; Heart attack in his paternal grandmother; Hypertension in his father; Pancreatic cancer in his mother; Stroke in his father and maternal grandmother. There is no history of Colon cancer, Esophageal cancer, Rectal cancer, Stomach cancer, or Ulcers.He reports that he has never smoked. He has never used smokeless tobacco. He reports that he drinks alcohol. He reports that he does not use illicit drugs.  Current Outpatient Prescriptions on File Prior to Visit  Medication Sig Dispense Refill  . allopurinol (ZYLOPRIM) 300 MG tablet Take 450 mg by mouth daily.     [name] amLODipine (NORVASC) 5 MG tablet Take 2 tablets (10 mg total) by mouth daily. 60 tablet 3  . Ascorbic Acid (VITAMIN C) 500  MG tablet Take 500 mg by mouth every morning.     [name] aspirin 81 MG tablet Take 81 mg by mouth daily.    [name] esomeprazole (NEXIUM) 40 MG capsule Take 1 capsule (40 mg total) by mouth daily. 30 capsule 5  . fluticasone (FLONASE) 50 MCG/ACT nasal spray INHALE ONE SPRAY INTO EACH NOSTRIL TWICE DAILY. 16 g 6  . glucose blood test strip One touch Verio test strips Check blood sugar once daily E11.9 100  each 11  . montelukast (SINGULAIR) 10 MG tablet TAKE ONE TABLET BY MOUTH NIGHTLY AT BEDTIME 30 tablet 2  . nitroGLYCERIN (NITROSTAT) 0.4 MG SL tablet Place 0.4 mg under the tongue every 5 (five) minutes as needed for chest pain.    Glory Rosebush DELICA LANCETS FINE MISC Check blood sugar once daily Dx E11.9 100 each 11  . potassium chloride SA (K-DUR,KLOR-CON) 20 MEQ tablet TAKE ONE TABLET BY MOUTH TWICE DAILY 60 tablet 5  . pregabalin (LYRICA) 75 MG capsule Take 75 mg by mouth 2 (two) times daily.    . psyllium (HYDROCIL/METAMUCIL) 95 % PACK Take 1 packet by mouth 2 (two) times daily.     No current facility-administered medications on file prior to visit.     Objective:  Objective Physical Exam  Constitutional: He is oriented to person, place, and time. He appears well-developed and well-nourished.  HENT:  Right Ear: Hearing, tympanic membrane, external ear and ear canal normal.  Left Ear: There is tenderness. Decreased hearing is noted.  Ears:  Nose: Mucosal edema present. No sinus tenderness. Right sinus exhibits no maxillary sinus tenderness and no frontal sinus tenderness. Left sinus exhibits no maxillary sinus tenderness and no frontal sinus tenderness.  Mouth/Throat: Posterior oropharyngeal erythema present. No posterior oropharyngeal edema.  + PND + errythema  Eyes: Conjunctivae are normal. Right eye exhibits no discharge. Left eye exhibits no discharge.  Cardiovascular: Normal rate, regular rhythm and normal heart sounds.   No murmur heard. Pulmonary/Chest: Effort normal and breath sounds normal. No respiratory distress. He has no wheezes. He has no rales. He exhibits no tenderness.  Musculoskeletal: He exhibits no edema.  Lymphadenopathy:    He has cervical adenopathy.  Neurological: He is alert and oriented to person, place, and time.  Psychiatric: He has a normal mood and affect. His behavior is normal. Judgment and thought content normal.  Nursing note and vitals  reviewed.  BP 140/62 mmHg  Pulse 90  Temp(Src) 98.7 F (37.1 C) (Oral)  Wt 275 lb 9.6 oz (125.011 kg)  SpO2 97% Wt Readings from Last 3 Encounters:  03/20/15 275 lb 9.6 oz (125.011 kg)  02/20/15 281 lb (127.461 kg)  01/03/15 281 lb 6.4 oz (127.642 kg)     Lab Results  Component Value Date   WBC 12.3* 03/20/2015   HGB 12.8* 03/20/2015   HCT 40.9 03/20/2015   PLT 360.0 03/20/2015   GLUCOSE 92 03/20/2015   CHOL 146 03/20/2015   TRIG 243.0* 03/20/2015   HDL 36.60* 03/20/2015   LDLDIRECT 95.0 03/20/2015   LDLCALC 52 12/20/2013   ALT 23 10/06/2014   AST 26 10/06/2014   NA 141 03/20/2015   K 4.3 03/20/2015   CL 101 03/20/2015   CREATININE 1.21 03/20/2015   BUN 22 03/20/2015   CO2 30 03/20/2015   TSH 2.07 03/20/2015   PSA 0.65 09/27/2013   INR 1.15 03/16/2014   HGBA1C 7.0* 03/20/2015   MICROALBUR 0.4 06/21/2014    No results found.   Assessment &  Plan:  Plan I have changed Mr. Ciano's ZETIA to ezetimibe. I have also changed his glimepiride, fenofibrate, and atorvastatin. I am also having him start on amoxicillin-clavulanate. Additionally, I am having him maintain his allopurinol, vitamin C, esomeprazole, psyllium, nitroGLYCERIN, aspirin, pregabalin, glucose blood, ONETOUCH DELICA LANCETS FINE, fluticasone, potassium chloride SA, amLODipine, montelukast, metoprolol tartrate, metFORMIN, and furosemide.  Meds ordered this encounter  Medications  . amoxicillin-clavulanate (AUGMENTIN) 875-125 MG per tablet    Sig: Take 1 tablet by mouth 2 (two) times daily.    Dispense:  20 tablet    Refill:  0  . ezetimibe (ZETIA) 10 MG tablet    Sig: Take 1 tablet (10 mg total) by mouth daily.    Dispense:  30 tablet    Refill:  5  . metoprolol tartrate (LOPRESSOR) 25 MG tablet    Sig: Take 0.5 tablets (12.5 mg total) by mouth 2 (two) times daily.    Dispense:  30 tablet    Refill:  11  . metFORMIN (GLUCOPHAGE) 1000 MG tablet    Sig: TAKE ONE AND ONE-HALF TABLETS BY MOUTH EVERY  MORNING AND 1 TALBET IN THE EVENING.    Dispense:  225 tablet    Refill:  1  . glimepiride (AMARYL) 2 MG tablet    Sig: Take 1 tablet (2 mg total) by mouth daily with breakfast.    Dispense:  30 tablet    Refill:  2  . furosemide (LASIX) 40 MG tablet    Sig: Take 1 tablet (40 mg total) by mouth 2 (two) times daily.    Dispense:  180 tablet    Refill:  2  . fenofibrate 160 MG tablet    Sig: Take 1 tablet (160 mg total) by mouth daily.    Dispense:  30 tablet    Refill:  2  . atorvastatin (LIPITOR) 80 MG tablet    Sig: Take 1 tablet (80 mg total) by mouth at bedtime.    Dispense:  90 tablet    Refill:  1    Problem List Items Addressed This Visit    Otitis media of left ear - Primary   Relevant Medications   amoxicillin-clavulanate (AUGMENTIN) 875-125 MG per tablet   HYPERTENSION, BENIGN   Relevant Medications   ezetimibe (ZETIA) 10 MG tablet   metoprolol tartrate (LOPRESSOR) 25 MG tablet   furosemide (LASIX) 40 MG tablet   fenofibrate 160 MG tablet   atorvastatin (LIPITOR) 80 MG tablet   Other Relevant Orders   Basic metabolic panel (Completed)   CBC with Differential/Platelet (Completed)   TSH (Completed)   POCT urinalysis dipstick (Completed)   HYPERLIPIDEMIA, MIXED   Relevant Medications   ezetimibe (ZETIA) 10 MG tablet   metoprolol tartrate (LOPRESSOR) 25 MG tablet   furosemide (LASIX) 40 MG tablet   fenofibrate 160 MG tablet   atorvastatin (LIPITOR) 80 MG tablet   Other Relevant Orders   Lipid panel (Completed)   POCT urinalysis dipstick (Completed)   Hearing loss of left ear due to cerumen impaction    IRRIGATED SUCCESSFULLY       Diabetes mellitus type 2 in obese   Relevant Medications   metFORMIN (GLUCOPHAGE) 1000 MG tablet   glimepiride (AMARYL) 2 MG tablet   atorvastatin (LIPITOR) 80 MG tablet   Other Relevant Orders   Hemoglobin A1c (Completed)   POCT urinalysis dipstick (Completed)      Follow-up: Return in about 6 months (around 09/17/2015),  or if symptoms worsen or fail to improve, for  hypertension, hyperlipidemia, diabetes II, annual exam, fasting.  Garnet Koyanagi, DO

## 2015-04-03 ENCOUNTER — Ambulatory Visit (INDEPENDENT_AMBULATORY_CARE_PROVIDER_SITE_OTHER): Payer: Medicare Other | Admitting: Medical

## 2015-04-03 ENCOUNTER — Encounter: Payer: Self-pay | Admitting: Medical

## 2015-04-03 ENCOUNTER — Ambulatory Visit (HOSPITAL_BASED_OUTPATIENT_CLINIC_OR_DEPARTMENT_OTHER)
Admission: RE | Admit: 2015-04-03 | Discharge: 2015-04-03 | Disposition: A | Discharge status: Home / Self Care | Payer: Medicare Other | Source: Ambulatory Visit | Attending: Medical | Admitting: Medical

## 2015-04-03 VITALS — BP 126/70 | HR 64 | Temp 97.4°F | Ht 72.0 in | Wt 278.0 lb

## 2015-04-03 DIAGNOSIS — J208 Acute bronchitis due to other specified organisms: Secondary | ICD-10-CM

## 2015-04-03 DIAGNOSIS — R0989 Other specified symptoms and signs involving the circulatory and respiratory systems: Secondary | ICD-10-CM | POA: Insufficient documentation

## 2015-04-03 DIAGNOSIS — R05 Cough: Secondary | ICD-10-CM | POA: Insufficient documentation

## 2015-04-03 DIAGNOSIS — J0100 Acute maxillary sinusitis, unspecified: Secondary | ICD-10-CM

## 2015-04-03 DIAGNOSIS — I25810 Atherosclerosis of coronary artery bypass graft(s) without angina pectoris: Secondary | ICD-10-CM | POA: Diagnosis not present

## 2015-04-03 MED ORDER — DOXYCYCLINE HYCLATE 100 MG PO TABS: 100.0000 mg | ORAL_TABLET | Freq: Two times a day (BID) | ORAL | Status: DC

## 2015-04-03 MED ORDER — CEFTRIAXONE SODIUM 1 G IJ SOLR
1.0000 g | Freq: Once | INTRAMUSCULAR | Status: AC
  Administered 2015-04-03: 1 g via INTRAMUSCULAR

## 2015-04-03 MED ORDER — BENZONATATE 100 MG PO CAPS: 100.0000 mg | ORAL_CAPSULE | Freq: Three times a day (TID) | ORAL | Status: DC | PRN

## 2015-04-03 NOTE — Progress Notes (Signed)
Subjective:    Patient ID: Richard [name], male    DOB: 02-18-1941, 74 y.o.   MRN: [id number]  HPI 2 wks ago had left ear infection, productive cough and st. Pt states he was on antibiotic for about 7 days. He felt well. But then about 7 days ago got recurrent chest congestion with sinus pressure. Pt states that his cough never cleared up. Pt states hx of allergies. He is on allegra, flonase for years. Pt states over last 2 wks cough won't subside.    Review of Systems  Constitutional: Negative for fever, chills and fatigue.  HENT: Positive for congestion, postnasal drip and sinus pressure. Negative for ear pain and tinnitus.   Respiratory: Positive for cough. Negative for chest tightness, shortness of breath and wheezing.   Cardiovascular: Negative for chest pain and palpitations.  Musculoskeletal: Negative for back pain.  Neurological: Negative for dizziness, seizures, syncope and headaches.  Hematological: Negative for adenopathy. Does not bruise/bleed easily.  Psychiatric/Behavioral: Negative for behavioral problems and confusion.    Past Medical History  Diagnosis Date  . Gout   . Esophageal stricture     X 4  . Mixed hyperlipidemia   . Peptic stricture of esophagus   . Antritis (stomach)     mild  . Duodenitis     chronic  . Diverticulosis     mild, left colon  . Anemia   . HTN (hypertension)   . Sliding hiatal hernia   . GERD (gastroesophageal reflux disease)   . CAD (coronary artery disease)   . HNP (herniated nucleus pulposus), lumbar   . Adenomatous colon polyp 05/01/2015    tubular  . Vitamin B12 deficiency   . Spinal stenosis   . Skin cancer     "right neck cut off; several frozen off my arms" (03/15/2014)  . OSA on CPAP     with 2L O2 at night  . Type II diabetes mellitus ([place])   . Migraines     "til age 57 yrs"  . Arthritis     "knees, hips, shoulders" (03/15/2014)  . History of gout   . Chronic lower back pain   . Renal insufficiency   .  Wenckebach block     a. brady down to 30s due to frequent block, metoprolol held in response.    Social History   Social History  . Marital Status: Divorced    Spouse Name: N/A  . Number of Children: 2  . Years of Education: N/A   Occupational History  .     Social History Main Topics  . Smoking status: Never Smoker   . Smokeless tobacco: Never Used  . Alcohol Use: 0.0 oz/week    0 Standard drinks or equivalent per week     Comment: "last drink was in 2012"( 03/15/2014)  . Drug Use: No  . Sexual Activity: Not Currently   Other Topics Concern  . Not on file   Social History Narrative   Divorced, lives with a roommate.   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    Past Surgical History  Procedure Laterality Date  . Vasectomy    . Coronary artery bypass graft  03/2000    "CABG X5"  . Esophageal dilation  X 3-4    Dr. Lyla Son; "last one  was in the 1990's"  . Esophagogastroduodenoscopy      multiple  . Flexible sigmoidoscopy      multiple  . Panendoscopy    . Knee arthroscopy Left 2011    meniscus repair  . Shoulder surgery Right 08/2010    screws placed; "tendons tore off"  . Colonoscopy w/ biopsies and polypectomy  2013  . Upper gi endoscopy  2013    Gastritis; Dr Carlean Purl  . Myelogram  04/06/13    lumbar, Dr Gladstone Lighter  . Lumbar laminectomy/decompression microdiscectomy Right 06/17/2013    Procedure: LUMBAR LAMINECTOMY MICRODISCECTOMY L4-L5 RIGHT EXCISION OF SYNOVIAL CYST RIGHT   (1 LEVEL) RIGHT PARTIAL FACETECTOMY;  Surgeon: Tobi Bastos, MD;  Location: [facility];  Service: Orthopedics;  Laterality: Right;  . Back surgery    . Appendectomy  ~ 1952  . Tonsillectomy  ~ 1954  . Coronary angioplasty  1993  . Coronary angioplasty with stent placement  05/1997    "1"  . Skin cancer excision  Right     "neck"  . Left heart catheterization with coronary/graft angiogram N/A 03/16/2014    Procedure: LEFT HEART CATHETERIZATION WITH Beatrix Fetters;  Surgeon: Burnell Blanks, MD;  Location: [facility] CATH LAB;  Service: Cardiovascular;  Laterality: N/A;    Family History  Problem Relation Age of Onset  . Stroke Father   . Hypertension Father   . Pancreatic cancer Mother   . Diabetes Maternal Grandmother   . Stroke Maternal Grandmother   . Heart attack Paternal Grandmother   . Colon cancer Neg Hx   . Esophageal cancer Neg Hx   . Rectal cancer Neg Hx   . Stomach cancer Neg Hx   . Ulcers Neg Hx     Allergies  Allergen Reactions  . Benazepril     angioedema; he is not a candidate for any angiotensin receptor blockers because of this significant allergic reaction. Because of a history of documented adverse serious drug reaction;Medi Alert bracelet  is recommended  . Hctz [Hydrochlorothiazide] Swelling    Tongue and lip swelling   . Aspirin Other (See Comments)    Gastritis, cant take 325 Mg aspirin     Current Outpatient Prescriptions on File Prior to Visit  Medication Sig Dispense Refill  . allopurinol (ZYLOPRIM) 300 MG tablet Take 450 mg by mouth daily.     [name] amLODipine (NORVASC) 5 MG tablet Take 2 tablets (10 mg total) by mouth daily. 60 tablet 3  . amoxicillin-clavulanate (AUGMENTIN) 875-125 MG per tablet Take 1 tablet by mouth 2 (two) times daily. 20 tablet 0  . Ascorbic Acid (VITAMIN C) 500 MG tablet Take 500 mg by mouth every morning.     [name] aspirin 81 MG tablet Take 81 mg by mouth daily.    [name] atorvastatin (LIPITOR) 80 MG tablet Take 1 tablet (80 mg total) by mouth at bedtime. 90 tablet 1  . esomeprazole (NEXIUM) 40 MG capsule Take 1 capsule (40 mg total) by mouth daily. 30 capsule 5  . ezetimibe (ZETIA) 10 MG tablet Take 1 tablet (10 mg total) by mouth daily. 30 tablet 5  . fenofibrate 160 MG tablet Take 1 tablet (160 mg total) by mouth daily. 30 tablet 2    . fluticasone (FLONASE) 50 MCG/ACT nasal spray INHALE ONE SPRAY INTO EACH NOSTRIL TWICE DAILY. 16 g 6  . furosemide (LASIX) 40 MG tablet Take 1 tablet (40 mg total) by mouth 2 (two) times daily. 180 tablet 2  . glimepiride (AMARYL) 2 MG  tablet Take 1 tablet (2 mg total) by mouth daily with breakfast. 30 tablet 2  . glucose blood test strip One touch Verio test strips Check blood sugar once daily E11.9 100 each 11  . metFORMIN (GLUCOPHAGE) 1000 MG tablet TAKE ONE AND ONE-HALF TABLETS BY MOUTH EVERY MORNING AND 1 TALBET IN THE EVENING. 225 tablet 1  . metoprolol tartrate (LOPRESSOR) 25 MG tablet Take 0.5 tablets (12.5 mg total) by mouth 2 (two) times daily. 30 tablet 11  . montelukast (SINGULAIR) 10 MG tablet TAKE ONE TABLET BY MOUTH NIGHTLY AT BEDTIME 30 tablet 2  . nitroGLYCERIN (NITROSTAT) 0.4 MG SL tablet Place 0.4 mg under the tongue every 5 (five) minutes as needed for chest pain.    Glory Rosebush DELICA LANCETS FINE MISC Check blood sugar once daily Dx E11.9 100 each 11  . potassium chloride SA (K-DUR,KLOR-CON) 20 MEQ tablet TAKE ONE TABLET BY MOUTH TWICE DAILY 60 tablet 5  . pregabalin (LYRICA) 75 MG capsule Take 75 mg by mouth 2 (two) times daily.    . psyllium (HYDROCIL/METAMUCIL) 95 % PACK Take 1 packet by mouth 2 (two) times daily.     No current facility-administered medications on file prior to visit.    BP 126/70 mmHg  Pulse 64  Temp(Src) 97.4 F (36.3 C) (Oral)  Ht 6' (1.829 m)  Wt 278 lb (126.1 kg)  BMI 37.70 kg/m2  SpO2 98%       Objective:   Physical Exam General  Mental Status - Alert. General Appearance - Well groomed. Not in acute distress.  Skin Rashes- No Rashes.  HEENT Head- Normal. Ear Auditory Canal - Left- Normal. Right - Normal.Tympanic Membrane- Left- Normal. Right- Normal. Eye Sclera/Conjunctiva- Left- Normal. Right- Normal. Nose & Sinuses Nasal Mucosa- Left-  Boggy and Congested. Right-  Boggy and  Congested.Bilateral maxillary sinus pressure but  no  frontal sinus pressure. Mouth & Throat Lips: Upper Lip- Normal: no dryness, cracking, pallor, cyanosis, or vesicular eruption. Lower Lip-Normal: no dryness, cracking, pallor, cyanosis or vesicular eruption. Buccal Mucosa- Bilateral- No Aphthous ulcers. Oropharynx- No Discharge or Erythema. +pnd. Tonsils: Characteristics- Bilateral- No Erythema or Congestion. Size/Enlargement- Bilateral- No enlargement. Discharge- bilateral-None.  Neck Neck- Supple. No Masses.   Chest and Lung Exam Auscultation: Breath Sounds:-Clear even and unlabored.  Cardiovascular Auscultation:Rythm- Regular, rate and rhythm. Murmurs & Other Heart Sounds:Ausculatation of the heart reveal- No Murmurs.  Lymphatic Head & Neck General Head & Neck Lymphatics: Bilateral: Description- No Localized lymphadenopathy.        Assessment & Plan:  For sinusitis and possible bronchitis. We will give you rocephin 1 gram Im today. Rx doxycycline antibiotic. Please get cxr today. Rx benzonatate for cough.  Continue allergy meds  Follow up in 7 days or as needed

## 2015-04-03 NOTE — Patient Instructions (Signed)
For sinusitis and possible bronchitis. We will give you rocephin 1 gram Im today. Rx doxycycline antibiotic. Please get cxr today. Rx benzonatate for cough.  Continue allergy meds  Follow up in 7 days or as needed

## 2015-04-12 DIAGNOSIS — L820 Inflamed seborrheic keratosis: Secondary | ICD-10-CM | POA: Diagnosis not present

## 2015-04-12 DIAGNOSIS — L821 Other seborrheic keratosis: Secondary | ICD-10-CM | POA: Diagnosis not present

## 2015-04-12 DIAGNOSIS — L918 Other hypertrophic disorders of the skin: Secondary | ICD-10-CM | POA: Diagnosis not present

## 2015-04-12 DIAGNOSIS — L578 Other skin changes due to chronic exposure to nonionizing radiation: Secondary | ICD-10-CM | POA: Diagnosis not present

## 2015-04-12 DIAGNOSIS — Z85828 Personal history of other malignant neoplasm of skin: Secondary | ICD-10-CM | POA: Diagnosis not present

## 2015-04-12 DIAGNOSIS — Z1283 Encounter for screening for malignant neoplasm of skin: Secondary | ICD-10-CM | POA: Diagnosis not present

## 2015-04-12 DIAGNOSIS — L812 Freckles: Secondary | ICD-10-CM | POA: Diagnosis not present

## 2015-04-12 DIAGNOSIS — L570 Actinic keratosis: Secondary | ICD-10-CM | POA: Diagnosis not present

## 2015-04-12 DIAGNOSIS — D1800 Hemangioma unspecified site: Secondary | ICD-10-CM | POA: Diagnosis not present

## 2015-04-29 ENCOUNTER — Other Ambulatory Visit: Payer: Self-pay | Admitting: Family Medicine

## 2015-05-02 DIAGNOSIS — M545 Low back pain: Secondary | ICD-10-CM | POA: Diagnosis not present

## 2015-05-06 ENCOUNTER — Other Ambulatory Visit: Payer: Self-pay | Admitting: Cardiology

## 2015-05-08 DIAGNOSIS — M150 Primary generalized (osteo)arthritis: Secondary | ICD-10-CM | POA: Diagnosis not present

## 2015-05-08 DIAGNOSIS — M1A09X0 Idiopathic chronic gout, multiple sites, without tophus (tophi): Secondary | ICD-10-CM | POA: Diagnosis not present

## 2015-05-08 DIAGNOSIS — G5793 Unspecified mononeuropathy of bilateral lower limbs: Secondary | ICD-10-CM | POA: Diagnosis not present

## 2015-05-09 ENCOUNTER — Other Ambulatory Visit: Payer: Self-pay | Admitting: Cardiology

## 2015-05-10 ENCOUNTER — Other Ambulatory Visit: Payer: Self-pay | Admitting: Cardiology

## 2015-05-11 ENCOUNTER — Other Ambulatory Visit: Payer: Self-pay | Admitting: Family Medicine

## 2015-05-11 ENCOUNTER — Other Ambulatory Visit: Payer: Self-pay | Admitting: Medical

## 2015-05-11 ENCOUNTER — Ambulatory Visit (INDEPENDENT_AMBULATORY_CARE_PROVIDER_SITE_OTHER): Payer: Medicare Other | Admitting: Family Medicine

## 2015-05-11 ENCOUNTER — Other Ambulatory Visit: Payer: Self-pay | Admitting: Cardiology

## 2015-05-11 ENCOUNTER — Encounter: Payer: Self-pay | Admitting: Family Medicine

## 2015-05-11 VITALS — BP 142/82 | HR 100 | Temp 98.8°F | Wt 278.2 lb

## 2015-05-11 DIAGNOSIS — J0110 Acute frontal sinusitis, unspecified: Secondary | ICD-10-CM | POA: Diagnosis not present

## 2015-05-11 DIAGNOSIS — Z889 Allergy status to unspecified drugs, medicaments and biological substances status: Secondary | ICD-10-CM | POA: Diagnosis not present

## 2015-05-11 DIAGNOSIS — I25810 Atherosclerosis of coronary artery bypass graft(s) without angina pectoris: Secondary | ICD-10-CM

## 2015-05-11 DIAGNOSIS — R05 Cough: Secondary | ICD-10-CM

## 2015-05-11 DIAGNOSIS — R059 Cough, unspecified: Secondary | ICD-10-CM

## 2015-05-11 MED ORDER — FEXOFENADINE HCL 180 MG PO TABS: 180.0000 mg | ORAL_TABLET | Freq: Every day | ORAL | Status: DC

## 2015-05-11 MED ORDER — METHYLPREDNISOLONE ACETATE 80 MG/ML IJ SUSP
80.0000 mg | Freq: Once | INTRAMUSCULAR | Status: AC
  Administered 2015-05-11: 80 mg via INTRAMUSCULAR

## 2015-05-11 MED ORDER — AMOXICILLIN-POT CLAVULANATE 875-125 MG PO TABS: 1.0000 | ORAL_TABLET | Freq: Two times a day (BID) | ORAL | Status: DC

## 2015-05-11 MED ORDER — GUAIFENESIN-CODEINE 100-10 MG/5ML PO SYRP: ORAL_SOLUTION | ORAL | Status: DC

## 2015-05-11 NOTE — Patient Instructions (Signed)
Sinusitis, Adult  Sinusitis is redness, soreness, and inflammation of the paranasal sinuses. Paranasal sinuses are air pockets within the bones of your face. They are located beneath your eyes, in the middle of your forehead, and above your eyes. In healthy paranasal sinuses, mucus is able to drain out, and air is able to circulate through them by way of your nose. However, when your paranasal sinuses are inflamed, mucus and air can become trapped. This can allow bacteria and other germs to grow and cause infection.  Sinusitis can develop quickly and last only a short time (acute) or continue over a long period (chronic). Sinusitis that lasts for more than 12 weeks is considered chronic.  CAUSES  Causes of sinusitis include:  · Allergies.  · Structural abnormalities, such as displacement of the cartilage that separates your nostrils (deviated septum), which can decrease the air flow through your nose and sinuses and affect sinus drainage.  · Functional abnormalities, such as when the small hairs (cilia) that line your sinuses and help remove mucus do not work properly or are not present.  SIGNS AND SYMPTOMS  Symptoms of acute and chronic sinusitis are the same. The primary symptoms are pain and pressure around the affected sinuses. Other symptoms include:  · Upper toothache.  · Earache.  · Headache.  · Bad breath.  · Decreased sense of smell and taste.  · A cough, which worsens when you are lying flat.  · Fatigue.  · Fever.  · Thick drainage from your nose, which often is green and may contain pus (purulent).  · Swelling and warmth over the affected sinuses.  DIAGNOSIS  Your health care provider will perform a physical exam. During your exam, your health care provider may perform any of the following to help determine if you have acute sinusitis or chronic sinusitis:  · Look in your nose for signs of abnormal growths in your nostrils (nasal polyps).  · Tap over the affected sinus to check for signs of  infection.  · View the inside of your sinuses using an imaging device that has a light attached (endoscope).  If your health care provider suspects that you have chronic sinusitis, one or more of the following tests may be recommended:  · Allergy tests.  · Nasal culture. A sample of mucus is taken from your nose, sent to a lab, and screened for bacteria.  · Nasal cytology. A sample of mucus is taken from your nose and examined by your health care provider to determine if your sinusitis is related to an allergy.  TREATMENT  Most cases of acute sinusitis are related to a viral infection and will resolve on their own within 10 days. Sometimes, medicines are prescribed to help relieve symptoms of both acute and chronic sinusitis. These may include pain medicines, decongestants, nasal steroid sprays, or saline sprays.  However, for sinusitis related to a bacterial infection, your health care provider will prescribe antibiotic medicines. These are medicines that will help kill the bacteria causing the infection.  Rarely, sinusitis is caused by a fungal infection. In these cases, your health care provider will prescribe antifungal medicine.  For some cases of chronic sinusitis, surgery is needed. Generally, these are cases in which sinusitis recurs more than 3 times per year, despite other treatments.  HOME CARE INSTRUCTIONS  · Drink plenty of water. Water helps thin the mucus so your sinuses can drain more easily.  · Use a humidifier.  · Inhale steam 3-4 times a day (for   example, sit in the bathroom with the shower running).  · Apply a warm, moist washcloth to your face 3-4 times a day, or as directed by your health care provider.  · Use saline nasal sprays to help moisten and clean your sinuses.  · Take medicines only as directed by your health care provider.  · If you were prescribed either an antibiotic or antifungal medicine, finish it all even if you start to feel better.  SEEK IMMEDIATE MEDICAL CARE IF:  · You have  increasing pain or severe headaches.  · You have nausea, vomiting, or drowsiness.  · You have swelling around your face.  · You have vision problems.  · You have a stiff neck.  · You have difficulty breathing.     This information is not intended to replace advice given to you by your health care provider. Make sure you discuss any questions you have with your health care provider.     Document Released: 06/17/2005 Document Revised: 07/08/2014 Document Reviewed: 07/02/2011  Elsevier Interactive Patient Education ©2016 [employer] Inc[employer]

## 2015-05-11 NOTE — Progress Notes (Signed)
Patient ID: Richard [name], male    DOB: August 04, 1940  Age: 74 y.o. MRN: [id number]    Subjective:  Subjective HPI Richard [name] presents for c/o congestion x several days with productive cough.  No fever.  Taking tylenol.     Review of Systems  Constitutional: Negative for diaphoresis, appetite change, fatigue and unexpected weight change.  Eyes: Negative for pain, redness and visual disturbance.  Respiratory: Negative for cough, chest tightness, shortness of breath and wheezing.   Cardiovascular: Negative for chest pain, palpitations and leg swelling.  Endocrine: Negative for cold intolerance, heat intolerance, polydipsia, polyphagia and polyuria.  Genitourinary: Negative for dysuria, frequency and difficulty urinating.  Neurological: Negative for dizziness, light-headedness, numbness and headaches.    History Past Medical History  Diagnosis Date  . Gout   . Esophageal stricture     X 4  . Mixed hyperlipidemia   . Peptic stricture of esophagus   . Antritis (stomach)     mild  . Duodenitis     chronic  . Diverticulosis     mild, left colon  . Anemia   . HTN (hypertension)   . Sliding hiatal hernia   . GERD (gastroesophageal reflux disease)   . CAD (coronary artery disease)   . HNP (herniated nucleus pulposus), lumbar   . Adenomatous colon polyp 05/01/2015    tubular  . Vitamin B12 deficiency   . Spinal stenosis   . Skin cancer     "right neck cut off; several frozen off my arms" (03/15/2014)  . OSA on CPAP     with 2L O2 at night  . Type II diabetes mellitus ([place])   . Migraines     "til age 66 yrs"  . Arthritis     "knees, hips, shoulders" (03/15/2014)  . History of gout   . Chronic lower back pain   . Renal insufficiency   . Wenckebach block     a. brady down to 30s due to frequent block, metoprolol held in response.    He has past surgical history that includes Vasectomy; Coronary artery bypass graft (03/2000); Esophageal dilation (X 3-4);  Esophagogastroduodenoscopy; Flexible sigmoidoscopy; Panendoscopy; Knee arthroscopy (Left, 2011); Shoulder surgery (Right, 08/2010); Colonoscopy w/ biopsies and polypectomy (2013); Upper gi endoscopy (2013); Myelogram (04/06/13); Lumbar laminectomy/decompression microdiscectomy (Right, 06/17/2013); Back surgery; Appendectomy (~ 1952); Tonsillectomy (~ 1954); Coronary angioplasty (1993); Coronary angioplasty with stent (05/1997); Skin cancer excision (Right); and left heart catheterization with coronary/graft angiogram (N/A, 03/16/2014).   His family history includes Diabetes in his maternal grandmother; Heart attack in his paternal grandmother; Hypertension in his father; Pancreatic cancer in his mother; Stroke in his father and maternal grandmother. There is no history of Colon cancer, Esophageal cancer, Rectal cancer, Stomach cancer, or Ulcers.He reports that he has never smoked. He has never used smokeless tobacco. He reports that he drinks alcohol. He reports that he does not use illicit drugs.  Current Outpatient Prescriptions on File Prior to Visit  Medication Sig Dispense Refill  . allopurinol (ZYLOPRIM) 300 MG tablet Take 450 mg by mouth daily.     . Ascorbic Acid (VITAMIN C) 500 MG tablet Take 500 mg by mouth every morning.     [name] aspirin 81 MG tablet Take 81 mg by mouth daily.    [name] atorvastatin (LIPITOR) 80 MG tablet Take 1 tablet (80 mg total) by mouth at bedtime. 90 tablet 1  . esomeprazole (NEXIUM) 40 MG capsule Take 1 capsule (40 mg total) by mouth daily. [place]  capsule 5  . ezetimibe (ZETIA) 10 MG tablet Take 1 tablet (10 mg total) by mouth daily. 30 tablet 5  . fenofibrate 160 MG tablet TAKE 1 TABLET BY MOUTH EVERY DAY 30 tablet 5  . fluticasone (FLONASE) 50 MCG/ACT nasal spray INHALE ONE SPRAY INTO EACH NOSTRIL TWICE DAILY. 16 g 5  . furosemide (LASIX) 40 MG tablet Take 1 tablet (40 mg total) by mouth 2 (two) times daily. 180 tablet 2  . glimepiride (AMARYL) 2 MG tablet TAKE ONE TABLET BY  MOUTH EVERY MORNING BEFORE BREAKFAST 30 tablet 2  . glucose blood test strip One touch Verio test strips Check blood sugar once daily E11.9 100 each 11  . metFORMIN (GLUCOPHAGE) 1000 MG tablet TAKE ONE AND ONE-HALF TABLETS BY MOUTH EVERY MORNING AND 1 TALBET IN THE EVENING. 225 tablet 1  . metoprolol tartrate (LOPRESSOR) 25 MG tablet Take 0.5 tablets (12.5 mg total) by mouth 2 (two) times daily. 30 tablet 11  . montelukast (SINGULAIR) 10 MG tablet TAKE ONE TABLET BY MOUTH NIGHTLY AT BEDTIME 30 tablet 2  . nitroGLYCERIN (NITROSTAT) 0.4 MG SL tablet Place 0.4 mg under the tongue every 5 (five) minutes as needed for chest pain.    Glory Rosebush DELICA LANCETS FINE MISC Check blood sugar once daily Dx E11.9 100 each 11  . pregabalin (LYRICA) 75 MG capsule Take 75 mg by mouth 2 (two) times daily.    . psyllium (HYDROCIL/METAMUCIL) 95 % PACK Take 1 packet by mouth 2 (two) times daily.    [name] amLODipine (NORVASC) 5 MG tablet Take 2 tablets (10 mg total) by mouth daily. 60 tablet 5  . potassium chloride SA (K-DUR,KLOR-CON) 20 MEQ tablet TAKE ONE TABLET BY MOUTH TWICE DAILY 60 tablet 5   No current facility-administered medications on file prior to visit.     Objective:  Objective Physical Exam  Constitutional: He is oriented to person, place, and time. He appears well-developed and well-nourished.  HENT:  Right Ear: External ear normal.  Left Ear: External ear normal.  Nose: Right sinus exhibits maxillary sinus tenderness and frontal sinus tenderness. Left sinus exhibits maxillary sinus tenderness and frontal sinus tenderness.  Mouth/Throat: Posterior oropharyngeal erythema present. No posterior oropharyngeal edema.  + PND + errythema  Eyes: Conjunctivae are normal. Right eye exhibits no discharge. Left eye exhibits no discharge.  Cardiovascular: Normal rate, regular rhythm and normal heart sounds.   No murmur heard. Pulmonary/Chest: Effort normal and breath sounds normal. No respiratory distress. He  has no wheezes. He has no rales. He exhibits no tenderness.  Musculoskeletal: He exhibits no edema.  Lymphadenopathy:    He has cervical adenopathy.  Neurological: He is alert and oriented to person, place, and time.  Psychiatric: He has a normal mood and affect. His behavior is normal.  Nursing note and vitals reviewed.  BP 142/82 mmHg  Pulse 100  Temp(Src) 98.8 F (37.1 C) (Oral)  Wt 278 lb 3.2 oz (126.191 kg)  SpO2 95% Wt Readings from Last 3 Encounters:  05/11/15 278 lb 3.2 oz (126.191 kg)  04/03/15 278 lb (126.1 kg)  03/20/15 275 lb 9.6 oz (125.011 kg)     Lab Results  Component Value Date   WBC 12.3* 03/20/2015   HGB 12.8* 03/20/2015   HCT 40.9 03/20/2015   PLT 360.0 03/20/2015   GLUCOSE 92 03/20/2015   CHOL 146 03/20/2015   TRIG 243.0* 03/20/2015   HDL 36.60* 03/20/2015   LDLDIRECT 95.0 03/20/2015   LDLCALC 52 12/20/2013  ALT 23 10/06/2014   AST 26 10/06/2014   NA 141 03/20/2015   K 4.3 03/20/2015   CL 101 03/20/2015   CREATININE 1.21 03/20/2015   BUN 22 03/20/2015   CO2 30 03/20/2015   TSH 2.07 03/20/2015   PSA 0.65 09/27/2013   INR 1.15 03/16/2014   HGBA1C 7.0* 03/20/2015   MICROALBUR 0.4 06/21/2014    Dg Chest 2 View  04/03/2015  CLINICAL DATA:  Cough, congestion. EXAM: CHEST  2 VIEW COMPARISON:  June 21, 2014. FINDINGS: The heart size and mediastinal contours are within normal limits. Both lungs are clear. Status post coronary artery bypass graft. No pneumothorax or pleural effusion is noted. The visualized skeletal structures are unremarkable. IMPRESSION: No active cardiopulmonary disease. Electronically Signed   By: Marijo Conception, M.D.   On: 04/03/2015 12:36     Assessment & Plan:  Plan I have discontinued Mr. Haberkorn's amoxicillin-clavulanate, doxycycline, and benzonatate. I am also having him start on fexofenadine, amoxicillin-clavulanate, and guaiFENesin-codeine. Additionally, I am having him maintain his allopurinol, vitamin C,  esomeprazole, psyllium, nitroGLYCERIN, aspirin, pregabalin, glucose blood, ONETOUCH DELICA LANCETS FINE, ezetimibe, metoprolol tartrate, metFORMIN, furosemide, atorvastatin, fluticasone, fenofibrate, montelukast, and glimepiride. We administered methylPREDNISolone acetate.  Meds ordered this encounter  Medications  . fexofenadine (ALLEGRA ALLERGY) 180 MG tablet    Sig: Take 1 tablet (180 mg total) by mouth daily.  [name] amoxicillin-clavulanate (AUGMENTIN) 875-125 MG tablet    Sig: Take 1 tablet by mouth 2 (two) times daily.    Dispense:  20 tablet    Refill:  0  . guaiFENesin-codeine (ROBITUSSIN AC) 100-10 MG/5ML syrup    Sig: 1-2 tsp po qhs prn    Dispense:  120 mL    Refill:  0  . methylPREDNISolone acetate (DEPO-MEDROL) injection 80 mg    Sig:     Problem List Items Addressed This Visit    None    Visit Diagnoses    Acute frontal sinusitis, recurrence not specified    -  Primary    Relevant Medications    fexofenadine (ALLEGRA ALLERGY) 180 MG tablet    amoxicillin-clavulanate (AUGMENTIN) 875-125 MG tablet    guaiFENesin-codeine (ROBITUSSIN AC) 100-10 MG/5ML syrup    methylPREDNISolone acetate (DEPO-MEDROL) injection 80 mg (Completed)    H/O seasonal allergies        Relevant Medications    fexofenadine (ALLEGRA ALLERGY) 180 MG tablet    methylPREDNISolone acetate (DEPO-MEDROL) injection 80 mg (Completed)    Cough        Relevant Medications    guaiFENesin-codeine (ROBITUSSIN AC) 100-10 MG/5ML syrup    methylPREDNISolone acetate (DEPO-MEDROL) injection 80 mg (Completed)       Follow-up: Return if symptoms worsen or fail to improve.  Garnet Koyanagi, DO

## 2015-05-11 NOTE — Progress Notes (Signed)
Pre visit review using our clinic review tool, if applicable. No additional management support is needed unless otherwise documented below in the visit note. 

## 2015-05-12 ENCOUNTER — Other Ambulatory Visit: Payer: Self-pay | Admitting: Cardiology

## 2015-05-22 ENCOUNTER — Other Ambulatory Visit: Payer: Self-pay | Admitting: Cardiology

## 2015-05-22 ENCOUNTER — Telehealth: Payer: Self-pay | Admitting: Family Medicine

## 2015-05-22 DIAGNOSIS — I6523 Occlusion and stenosis of bilateral carotid arteries: Secondary | ICD-10-CM

## 2015-05-22 MED ORDER — LEVOFLOXACIN 500 MG PO TABS: 500.0000 mg | ORAL_TABLET | Freq: Every day | ORAL | Status: DC

## 2015-05-22 NOTE — Telephone Encounter (Signed)
Last seen 05/11/15 and treated for a sinus infection.      KP

## 2015-05-22 NOTE — Telephone Encounter (Signed)
levaquin 500 mg 1 po qd x 10 days

## 2015-05-22 NOTE — Telephone Encounter (Signed)
Patient has been made aware that the Rx is being faxed.        KP

## 2015-05-22 NOTE — Telephone Encounter (Signed)
Pharmacy: [facility], [place]  Reason for call: Pt is not much better at all. He had a shot and finished the course of abx given. Pt is requesting something else or to notify if he has to come back in. He is still coughing up stuff that is colored and mostly in the mornings.

## 2015-05-22 NOTE — Telephone Encounter (Signed)
fwding to Kim °

## 2015-05-30 ENCOUNTER — Ambulatory Visit (HOSPITAL_COMMUNITY)
Admission: RE | Admit: 2015-05-30 | Discharge: 2015-05-30 | Disposition: A | Discharge status: Home / Self Care | Payer: Medicare Other | Source: Ambulatory Visit | Attending: Cardiovascular Disease | Admitting: Cardiovascular Disease

## 2015-05-30 DIAGNOSIS — E119 Type 2 diabetes mellitus without complications: Secondary | ICD-10-CM | POA: Insufficient documentation

## 2015-05-30 DIAGNOSIS — I779 Disorder of arteries and arterioles, unspecified: Secondary | ICD-10-CM | POA: Insufficient documentation

## 2015-05-30 DIAGNOSIS — E782 Mixed hyperlipidemia: Secondary | ICD-10-CM | POA: Diagnosis not present

## 2015-05-30 DIAGNOSIS — I10 Essential (primary) hypertension: Secondary | ICD-10-CM | POA: Diagnosis not present

## 2015-05-30 DIAGNOSIS — I6523 Occlusion and stenosis of bilateral carotid arteries: Secondary | ICD-10-CM | POA: Insufficient documentation

## 2015-05-30 DIAGNOSIS — I739 Peripheral vascular disease, unspecified: Secondary | ICD-10-CM

## 2015-06-15 ENCOUNTER — Other Ambulatory Visit: Payer: Self-pay | Admitting: Family Medicine

## 2015-06-16 ENCOUNTER — Encounter: Payer: Self-pay | Admitting: Medical

## 2015-06-16 ENCOUNTER — Ambulatory Visit (INDEPENDENT_AMBULATORY_CARE_PROVIDER_SITE_OTHER): Payer: Medicare Other | Admitting: Medical

## 2015-06-16 VITALS — BP 124/76 | HR 80 | Temp 97.6°F | Ht 72.0 in | Wt 284.0 lb

## 2015-06-16 DIAGNOSIS — J309 Allergic rhinitis, unspecified: Secondary | ICD-10-CM

## 2015-06-16 DIAGNOSIS — I6523 Occlusion and stenosis of bilateral carotid arteries: Secondary | ICD-10-CM

## 2015-06-16 DIAGNOSIS — J0100 Acute maxillary sinusitis, unspecified: Secondary | ICD-10-CM | POA: Diagnosis not present

## 2015-06-16 MED ORDER — METHYLPREDNISOLONE ACETATE 40 MG/ML IJ SUSP
20.0000 mg | Freq: Once | INTRAMUSCULAR | Status: AC
  Administered 2015-06-16: 20 mg via INTRAMUSCULAR

## 2015-06-16 MED ORDER — LEVOFLOXACIN 500 MG PO TABS: 500.0000 mg | ORAL_TABLET | Freq: Every day | ORAL | Status: DC

## 2015-06-16 MED ORDER — HYDROCODONE-HOMATROPINE 5-1.5 MG/5ML PO SYRP: ORAL_SOLUTION | ORAL | Status: DC

## 2015-06-16 NOTE — Progress Notes (Signed)
Pre visit review using our clinic review tool, if applicable. No additional management support is needed unless otherwise documented below in the visit note. 

## 2015-06-16 NOTE — Progress Notes (Signed)
Subjective:    Patient ID: Richard [name], male    DOB: [birth year], 74 y.o.   MRN: [id number]  HPI  Pt in states that this past week he got sick again. He state on and off cough, congestion, runny nose and occasional sneezing. His eyes are watering a little. Pt at end of November had levofloxin called in. Pt has some sinus pressure.   Since fall appears to have had augmentin, doxy and levofloxin.(recurrent illness uri vs allergies then states sinus infection type symtpoms).  Pt takes allegra routinely and flonase.  Pt sugars 75 this am. 2 months ago. 7.0 A1-c most recently.    Review of Systems  Constitutional: Negative for fever, chills and fatigue.  HENT: Positive for congestion, rhinorrhea and sneezing.   Eyes:       Eye watery.  Respiratory: Positive for cough. Negative for chest tightness and wheezing.        Occasional productive.  Cardiovascular: Negative for chest pain and palpitations.  Endocrine: Negative for polydipsia, polyphagia and polyuria.  Musculoskeletal: Positive for myalgias.       Slight achinees shoulders on Monday but no diffuse flu like achiness on discussion.  Neurological: Negative for dizziness, numbness and headaches.  Hematological: Negative for adenopathy.  Psychiatric/Behavioral: Negative for behavioral problems and confusion.     Past Medical History  Diagnosis Date  . Gout   . Esophageal stricture     X 4  . Mixed hyperlipidemia   . Peptic stricture of esophagus   . Antritis (stomach)     mild  . Duodenitis     chronic  . Diverticulosis     mild, left colon  . Anemia   . HTN (hypertension)   . Sliding hiatal hernia   . GERD (gastroesophageal reflux disease)   . CAD (coronary artery disease)   . HNP (herniated nucleus pulposus), lumbar   . Adenomatous colon polyp 05/01/2015    tubular  . Vitamin B12 deficiency   . Spinal stenosis   . Skin cancer     "right neck cut off; several frozen off my arms" (03/15/2014)  . OSA on CPAP       with 2L O2 at night  . Type II diabetes mellitus ([place])   . Migraines     "til age 59 yrs"  . Arthritis     "knees, hips, shoulders" (03/15/2014)  . History of gout   . Chronic lower back pain   . Renal insufficiency   . Wenckebach block     a. brady down to 30s due to frequent block, metoprolol held in response.    Social History   Social History  . Marital Status: Divorced    Spouse Name: N/A  . Number of Children: 2  . Years of Education: N/A   Occupational History  .     Social History Main Topics  . Smoking status: Never Smoker   . Smokeless tobacco: Never Used  . Alcohol Use: 0.0 oz/week    0 Standard drinks or equivalent per week     Comment: "last drink was in 2012"( 03/15/2014)  . Drug Use: No  . Sexual Activity: Not Currently   Other Topics Concern  . Not on file   Social History Narrative   Divorced, lives with a roommate.   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    Past Surgical History  Procedure Laterality Date  . Vasectomy    . Coronary artery bypass graft  03/2000    "CABG X5"  . Esophageal dilation  X 3-4    Dr. Lyla Son; "last one was in the 1990's"  . Esophagogastroduodenoscopy      multiple  . Flexible sigmoidoscopy      multiple  . Panendoscopy    . Knee arthroscopy Left 2011    meniscus repair  . Shoulder surgery Right 08/2010    screws placed; "tendons tore off"  . Colonoscopy w/ biopsies and polypectomy  2013  . Upper gi endoscopy  2013    Gastritis; Dr Carlean Purl  . Myelogram  04/06/13    lumbar, Dr Gladstone Lighter  . Lumbar laminectomy/decompression microdiscectomy Right 06/17/2013    Procedure: LUMBAR LAMINECTOMY MICRODISCECTOMY L4-L5 RIGHT EXCISION OF SYNOVIAL CYST RIGHT   (1 LEVEL) RIGHT PARTIAL FACETECTOMY;  Surgeon: Tobi Bastos, MD;  Location: [facility];   Service: Orthopedics;  Laterality: Right;  . Back surgery    . Appendectomy  ~ 1952  . Tonsillectomy  ~ 1954  . Coronary angioplasty  1993  . Coronary angioplasty with stent placement  05/1997    "1"  . Skin cancer excision Right     "neck"  . Left heart catheterization with coronary/graft angiogram N/A 03/16/2014    Procedure: LEFT HEART CATHETERIZATION WITH Beatrix Fetters;  Surgeon: Burnell Blanks, MD;  Location: [facility] CATH LAB;  Service: Cardiovascular;  Laterality: N/A;    Family History  Problem Relation Age of Onset  . Stroke Father   . Hypertension Father   . Pancreatic cancer Mother   . Diabetes Maternal Grandmother   . Stroke Maternal Grandmother   . Heart attack Paternal Grandmother   . Colon cancer Neg Hx   . Esophageal cancer Neg Hx   . Rectal cancer Neg Hx   . Stomach cancer Neg Hx   . Ulcers Neg Hx     Allergies  Allergen Reactions  . Benazepril     angioedema; he is not a candidate for any angiotensin receptor blockers because of this significant allergic reaction. Because of a history of documented adverse serious drug reaction;Medi Alert bracelet  is recommended  . Hctz [Hydrochlorothiazide] Swelling    Tongue and lip swelling   . Aspirin Other (See Comments)    Gastritis, cant take 325 Mg aspirin     Current Outpatient Prescriptions on File Prior to Visit  Medication Sig Dispense Refill  . allopurinol (ZYLOPRIM) 300 MG tablet Take 450 mg by mouth daily.     [name] amLODipine (NORVASC) 5 MG tablet Take 2 tablets (10 mg total) by mouth daily. 60 tablet 5  . amoxicillin-clavulanate (AUGMENTIN) 875-125 MG tablet Take 1 tablet by mouth 2 (two) times daily. 20 tablet 0  . Ascorbic Acid (VITAMIN C) 500 MG tablet Take 500 mg by mouth every morning.     [name] aspirin 81 MG tablet Take 81 mg by mouth daily.    [name] atorvastatin (LIPITOR) 80 MG tablet Take 1 tablet (80 mg total) by mouth at bedtime. 90 tablet 1  . atorvastatin (LIPITOR) 80 MG tablet TAKE ONE  TABLET BY MOUTH AT BEDTIME 90 tablet 1  . esomeprazole (NEXIUM) 40 MG capsule Take 1 capsule (40 mg total) by mouth daily. 30 capsule 5  . ezetimibe (ZETIA) 10 MG tablet Take 1 tablet (10 mg total) by mouth daily. 30 tablet 5  .  fenofibrate 160 MG tablet TAKE 1 TABLET BY MOUTH EVERY DAY 30 tablet 5  . fexofenadine (ALLEGRA ALLERGY) 180 MG tablet Take 1 tablet (180 mg total) by mouth daily.    . fluticasone (FLONASE) 50 MCG/ACT nasal spray INHALE ONE SPRAY INTO EACH NOSTRIL TWICE DAILY. 16 g 5  . furosemide (LASIX) 40 MG tablet Take 1 tablet (40 mg total) by mouth 2 (two) times daily. 180 tablet 2  . glimepiride (AMARYL) 2 MG tablet TAKE ONE TABLET BY MOUTH EVERY MORNING BEFORE BREAKFAST 30 tablet 2  . glucose blood test strip One touch Verio test strips Check blood sugar once daily E11.9 100 each 11  . guaiFENesin-codeine (ROBITUSSIN AC) 100-10 MG/5ML syrup 1-2 tsp po qhs prn 120 mL 0  . levofloxacin (LEVAQUIN) 500 MG tablet Take 1 tablet (500 mg total) by mouth daily. 10 tablet 0  . metFORMIN (GLUCOPHAGE) 1000 MG tablet TAKE ONE AND ONE-HALF TABLETS BY MOUTH EVERY MORNING AND 1 TALBET IN THE EVENING. 225 tablet 1  . metoprolol tartrate (LOPRESSOR) 25 MG tablet Take 0.5 tablets (12.5 mg total) by mouth 2 (two) times daily. 30 tablet 11  . montelukast (SINGULAIR) 10 MG tablet TAKE ONE TABLET BY MOUTH NIGHTLY AT BEDTIME 30 tablet 2  . nitroGLYCERIN (NITROSTAT) 0.4 MG SL tablet Place 0.4 mg under the tongue every 5 (five) minutes as needed for chest pain.    Glory Rosebush DELICA LANCETS FINE MISC Check blood sugar once daily Dx E11.9 100 each 11  . potassium chloride SA (K-DUR,KLOR-CON) 20 MEQ tablet TAKE ONE TABLET BY MOUTH TWICE DAILY 60 tablet 5  . pregabalin (LYRICA) 75 MG capsule Take 75 mg by mouth 2 (two) times daily.    . psyllium (HYDROCIL/METAMUCIL) 95 % PACK Take 1 packet by mouth 2 (two) times daily.     No current facility-administered medications on file prior to visit.    BP 124/76  mmHg  Pulse 80  Temp(Src) 97.6 F (36.4 C) (Oral)  Ht 6' (1.829 m)  Wt 284 lb (128.822 kg)  BMI 38.51 kg/m2  SpO2 95%       Objective:   Physical Exam  General  Mental Status - Alert. General Appearance - Well groomed. Not in acute distress.  Skin Rashes- No Rashes.  HEENT Head- Normal. Ear Auditory Canal - Left- Normal. Right - Normal.Tympanic Membrane- Left- Normal. Right- Normal. Eye Sclera/Conjunctiva- Left- Normal. Right- Normal. Nose & Sinuses Nasal Mucosa- Left-  Boggy and Congested. Right-  Boggy and  Congested.Bilateral maxillary sinus pressure \\but  no frontal sinus pressure. Mouth & Throat Lips: Upper Lip- Normal: no dryness, cracking, pallor, cyanosis, or vesicular eruption. Lower Lip-Normal: no dryness, cracking, pallor, cyanosis or vesicular eruption. Buccal Mucosa- Bilateral- No Aphthous ulcers. Oropharynx- No Discharge or Erythema. +pnd. Tonsils: Characteristics- Bilateral- No Erythema or Congestion. Size/Enlargement- Bilateral- No enlargement. Discharge- bilateral-None.  Neck Neck- Supple. No Masses.   Chest and Lung Exam Auscultation: Breath Sounds:-Clear even and unlabored.  Cardiovascular Auscultation:Rythm- Regular, rate and rhythm. Murmurs & Other Heart Sounds:Ausculatation of the heart reveal- No Murmurs.  Lymphatic Head & Neck General Head & Neck Lymphatics: Bilateral: Description- No Localized lymphadenopathy.       Assessment & Plan:  For likley recurrent allergies take your allegra, flonase and montelukast(you should have 2 refills of this if not let us know). I will add low dose depmedrol 20 mg im today. Watch you sugar intake, take diabetic meds since I gave you steroid. Your a1-c has been good and bs this am  75 so I think it is safe to give depomedrol.  Rx hycodan for cough.  Rx levofloxin for sinus infection.  Follow up in 2 wks or as needed  Beside office visit list flu. He does not present with flu like syndrome. He had  faint achiness on Monday. So even if atypical mild flu. 5 days post onset.

## 2015-06-16 NOTE — Patient Instructions (Signed)
For likley recurrent allergies take your allegra, flonase and montelukast(you should have 2 refills of this if not let us know). I will add low dose depmedrol 20 mg im today. Watch you sugar intake, take diabetic meds since I gave you steroid. Your a1-c has been good and bs this am 75 so I think it is safe to give depomedrol.  Rx hycodan for cough.  Rx levofloxin for sinus infection.  Follow up in 2 wks or as needed

## 2015-06-28 ENCOUNTER — Encounter: Payer: Self-pay | Admitting: *Deleted

## 2015-07-07 ENCOUNTER — Other Ambulatory Visit: Payer: Self-pay

## 2015-07-07 MED ORDER — MONTELUKAST SODIUM 10 MG PO TABS: 10.0000 mg | ORAL_TABLET | Freq: Every day | ORAL | Status: DC

## 2015-07-07 MED ORDER — GLIMEPIRIDE 2 MG PO TABS: 2.0000 mg | ORAL_TABLET | Freq: Every day | ORAL | Status: DC

## 2015-07-07 NOTE — Telephone Encounter (Signed)
Rx sent to pharmacy 07/07/15. //HSM.

## 2015-07-13 ENCOUNTER — Other Ambulatory Visit: Payer: Self-pay

## 2015-07-13 MED ORDER — GLIMEPIRIDE 2 MG PO TABS: 2.0000 mg | ORAL_TABLET | Freq: Every day | ORAL | Status: DC

## 2015-07-17 DIAGNOSIS — L711 Rhinophyma: Secondary | ICD-10-CM | POA: Diagnosis not present

## 2015-07-17 DIAGNOSIS — L812 Freckles: Secondary | ICD-10-CM | POA: Diagnosis not present

## 2015-07-17 DIAGNOSIS — L918 Other hypertrophic disorders of the skin: Secondary | ICD-10-CM | POA: Diagnosis not present

## 2015-07-17 DIAGNOSIS — L821 Other seborrheic keratosis: Secondary | ICD-10-CM | POA: Diagnosis not present

## 2015-07-17 DIAGNOSIS — L820 Inflamed seborrheic keratosis: Secondary | ICD-10-CM | POA: Diagnosis not present

## 2015-07-17 DIAGNOSIS — L578 Other skin changes due to chronic exposure to nonionizing radiation: Secondary | ICD-10-CM | POA: Diagnosis not present

## 2015-07-17 DIAGNOSIS — L570 Actinic keratosis: Secondary | ICD-10-CM | POA: Diagnosis not present

## 2015-07-27 ENCOUNTER — Other Ambulatory Visit: Payer: Self-pay | Admitting: Cardiology

## 2015-07-28 ENCOUNTER — Ambulatory Visit (HOSPITAL_BASED_OUTPATIENT_CLINIC_OR_DEPARTMENT_OTHER)
Admission: RE | Admit: 2015-07-28 | Discharge: 2015-07-28 | Disposition: A | Discharge status: Home / Self Care | Payer: Medicare Other | Source: Ambulatory Visit | Attending: Medical | Admitting: Medical

## 2015-07-28 ENCOUNTER — Ambulatory Visit (INDEPENDENT_AMBULATORY_CARE_PROVIDER_SITE_OTHER): Payer: Medicare Other | Admitting: Medical

## 2015-07-28 ENCOUNTER — Encounter: Payer: Self-pay | Admitting: Medical

## 2015-07-28 VITALS — BP 140/70 | HR 86 | Temp 97.8°F | Ht 72.0 in | Wt 286.5 lb

## 2015-07-28 DIAGNOSIS — R0602 Shortness of breath: Secondary | ICD-10-CM | POA: Insufficient documentation

## 2015-07-28 DIAGNOSIS — R0600 Dyspnea, unspecified: Secondary | ICD-10-CM | POA: Diagnosis not present

## 2015-07-28 DIAGNOSIS — D519 Vitamin B12 deficiency anemia, unspecified: Secondary | ICD-10-CM | POA: Diagnosis not present

## 2015-07-28 DIAGNOSIS — D649 Anemia, unspecified: Secondary | ICD-10-CM | POA: Insufficient documentation

## 2015-07-28 DIAGNOSIS — R5383 Other fatigue: Secondary | ICD-10-CM

## 2015-07-28 DIAGNOSIS — R600 Localized edema: Secondary | ICD-10-CM

## 2015-07-28 LAB — CBC WITH DIFFERENTIAL/PLATELET
BASOS PCT: 0.5 % (ref 0.0–3.0)
Basophils Absolute: 0 10*3/uL (ref 0.0–0.1)
EOS ABS: 0.2 10*3/uL (ref 0.0–0.7)
EOS PCT: 2 % (ref 0.0–5.0)
HEMATOCRIT: 40.7 % (ref 39.0–52.0)
HEMOGLOBIN: 12.8 g/dL — AB (ref 13.0–17.0)
LYMPHS PCT: 16.8 % (ref 12.0–46.0)
Lymphs Abs: 1.5 10*3/uL (ref 0.7–4.0)
MCHC: 31.5 g/dL (ref 30.0–36.0)
MCV: 74.9 fl — ABNORMAL LOW (ref 78.0–100.0)
MONOS PCT: 6.5 % (ref 3.0–12.0)
Monocytes Absolute: 0.6 10*3/uL (ref 0.1–1.0)
Neutro Abs: 6.7 10*3/uL (ref 1.4–7.7)
Neutrophils Relative %: 74.2 % (ref 43.0–77.0)
Platelets: 370 10*3/uL (ref 150.0–400.0)
RBC: 5.43 Mil/uL (ref 4.22–5.81)
RDW: 19.8 % — AB (ref 11.5–15.5)
WBC: 9.1 10*3/uL (ref 4.0–10.5)

## 2015-07-28 LAB — IRON AND TIBC
%SAT: 8 % — ABNORMAL LOW (ref 15–60)
Iron: 38 ug/dL — ABNORMAL LOW (ref 50–180)
TIBC: 456 ug/dL — ABNORMAL HIGH (ref 250–425)
UIBC: 418 ug/dL — AB (ref 125–400)

## 2015-07-28 LAB — COMPREHENSIVE METABOLIC PANEL
ALBUMIN: 4.2 g/dL (ref 3.5–5.2)
ALT: 19 U/L (ref 0–53)
AST: 25 U/L (ref 0–37)
Alkaline Phosphatase: 76 U/L (ref 39–117)
BILIRUBIN TOTAL: 0.4 mg/dL (ref 0.2–1.2)
BUN: 20 mg/dL (ref 6–23)
CALCIUM: 9.6 mg/dL (ref 8.4–10.5)
CHLORIDE: 101 meq/L (ref 96–112)
CO2: 27 mEq/L (ref 19–32)
CREATININE: 1.13 mg/dL (ref 0.40–1.50)
GFR: 66.88 mL/min (ref >60.00)
Glucose, Bld: 149 mg/dL — ABNORMAL HIGH (ref 70–99)
Potassium: 3.8 mEq/L (ref 3.5–5.1)
Sodium: 138 mEq/L (ref 135–145)
Total Protein: 7.2 g/dL (ref 6.0–8.3)

## 2015-07-28 LAB — TSH: TSH: 2.53 u[IU]/mL (ref 0.35–4.50)

## 2015-07-28 LAB — FERRITIN: Ferritin: 18.4 ng/mL — ABNORMAL LOW (ref 22.0–322.0)

## 2015-07-28 LAB — VITAMIN B12: VITAMIN B 12: 103 pg/mL — AB (ref 211–911)

## 2015-07-28 NOTE — Patient Instructions (Addendum)
With your recent mild anemia and fatigue will get cbc and iron studies.  For fatigue will also include cmp and tsh.  With mild edema of lower legs and faint occasional sob will get cxr.  After labs back will make decision on referring you back to Dr. Marin Olp.  Follow up in 2 wks with pcp or as needed with myself.

## 2015-07-28 NOTE — Progress Notes (Signed)
Subjective:    Patient ID: Richard [name], male    DOB: [birth year], 75 y.o.   MRN: [id number]  HPI   Pt in states yesterday he went to give blood and his hb was 12.6. Pt told in past he had transfusion of iron. Pt states his hb had to 13 in order to give blood. Pt states he has seen Dr. Marin Olp in th past.  On review of his anemia level in past was very mild when he Dr. Marin Olp. Looks like his iron  Was mild low. His saturation was 10.  Pt had colonsocpy in the fall. Also had endoscopy. Both were negative except for gastritis. He did test for blood in stool and was negative. No blood in stools recently.  Pt legs feel little weak at time.  Pt states some lower leg swelling during the day. But not in am. Mild sob when walking.    Review of Systems  Constitutional: Negative for fever, chills and fatigue.  Respiratory: Negative for cough, chest tightness, shortness of breath and wheezing.        Mild sob at times walking long distance.  Cardiovascular: Negative for chest pain and palpitations.  Gastrointestinal: Negative for abdominal pain.  Neurological: Negative for dizziness and headaches.  Hematological: Negative for adenopathy. Does not bruise/bleed easily.    Past Medical History  Diagnosis Date  . Gout   . Esophageal stricture     X 4  . Mixed hyperlipidemia   . Peptic stricture of esophagus   . Antritis (stomach)     mild  . Duodenitis     chronic  . Diverticulosis     mild, left colon  . Anemia   . HTN (hypertension)   . Sliding hiatal hernia   . GERD (gastroesophageal reflux disease)   . CAD (coronary artery disease)   . HNP (herniated nucleus pulposus), lumbar   . Adenomatous colon polyp 05/01/2015    tubular  . Vitamin B12 deficiency   . Spinal stenosis   . Skin cancer     "right neck cut off; several frozen off my arms" (03/15/2014)  . OSA on CPAP     with 2L O2 at night  . Type II diabetes mellitus ([place])   . Migraines     "til age 64 yrs"  .  Arthritis     "knees, hips, shoulders" (03/15/2014)  . History of gout   . Chronic lower back pain   . Renal insufficiency   . Wenckebach block     a. brady down to 30s due to frequent block, metoprolol held in response.    Social History   Social History  . Marital Status: Divorced    Spouse Name: N/A  . Number of Children: 2  . Years of Education: N/A   Occupational History  .     Social History Main Topics  . Smoking status: Never Smoker   . Smokeless tobacco: Never Used  . Alcohol Use: 0.0 oz/week    0 Standard drinks or equivalent per week     Comment: "last drink was in 2012"( 03/15/2014)  . Drug Use: No  . Sexual Activity: Not Currently   Other Topics Concern  . Not on file   Social History Narrative   Divorced, lives with a roommate.   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    Past Surgical History  Procedure Laterality Date  . Vasectomy    . Coronary artery bypass graft  03/2000    "CABG X5"  . Esophageal dilation  X 3-4    Dr. Lyla Son; "last one was in the 1990's"  . Esophagogastroduodenoscopy      multiple  . Flexible sigmoidoscopy      multiple  . Panendoscopy    . Knee arthroscopy Left 2011    meniscus repair  . Shoulder surgery Right 08/2010    screws placed; "tendons tore off"  . Colonoscopy w/ biopsies and polypectomy  2013  . Upper gi endoscopy  2013    Gastritis; Dr Carlean Purl  . Myelogram  04/06/13    lumbar, Dr Gladstone Lighter  . Lumbar laminectomy/decompression microdiscectomy Right 06/17/2013    Procedure: LUMBAR LAMINECTOMY MICRODISCECTOMY L4-L5 RIGHT EXCISION OF SYNOVIAL CYST RIGHT   (1 LEVEL) RIGHT PARTIAL FACETECTOMY;  Surgeon: Tobi Bastos, MD;  Location: [facility];  Service: Orthopedics;  Laterality: Right;  . Back surgery    . Appendectomy  ~ 1952  .  Tonsillectomy  ~ 1954  . Coronary angioplasty  1993  . Coronary angioplasty with stent placement  05/1997    "1"  . Skin cancer excision Right     "neck"  . Left heart catheterization with coronary/graft angiogram N/A 03/16/2014    Procedure: LEFT HEART CATHETERIZATION WITH Beatrix Fetters;  Surgeon: Burnell Blanks, MD;  Location: [facility] CATH LAB;  Service: Cardiovascular;  Laterality: N/A;    Family History  Problem Relation Age of Onset  . Stroke Father   . Hypertension Father   . Pancreatic cancer Mother   . Diabetes Maternal Grandmother   . Stroke Maternal Grandmother   . Heart attack Paternal Grandmother   . Colon cancer Neg Hx   . Esophageal cancer Neg Hx   . Rectal cancer Neg Hx   . Stomach cancer Neg Hx   . Ulcers Neg Hx     Allergies  Allergen Reactions  . Benazepril     angioedema; he is not a candidate for any angiotensin receptor blockers because of this significant allergic reaction. Because of a history of documented adverse serious drug reaction;Medi Alert bracelet  is recommended  . Hctz [Hydrochlorothiazide] Swelling    Tongue and lip swelling   . Aspirin Other (See Comments)    Gastritis, cant take 325 Mg aspirin     Current Outpatient Prescriptions on File Prior to Visit  Medication Sig Dispense Refill  . allopurinol (ZYLOPRIM) 300 MG tablet Take 450 mg by mouth daily.     [name] amLODipine (NORVASC) 5 MG tablet Take 2 tablets (10 mg total) by mouth daily. 60 tablet 5  . Ascorbic Acid (VITAMIN C) 500 MG tablet Take 500 mg by mouth every morning.     [name] aspirin 81 MG tablet Take 81 mg by mouth daily.    [name] atorvastatin (LIPITOR) 80 MG tablet Take 1 tablet (80 mg total) by mouth at bedtime. 90 tablet 1  . atorvastatin (LIPITOR) 80 MG tablet TAKE ONE TABLET BY MOUTH AT BEDTIME 90 tablet 1  . esomeprazole (NEXIUM) 40 MG capsule Take 1 capsule (40 mg total) by mouth daily. 30 capsule 5  . fenofibrate 160 MG tablet TAKE 1 TABLET BY MOUTH EVERY DAY 30  tablet 5  . fexofenadine (ALLEGRA ALLERGY) 180 MG tablet Take 1 tablet (180 mg total) by mouth daily.    . fluticasone (  FLONASE) 50 MCG/ACT nasal spray INHALE ONE SPRAY INTO EACH NOSTRIL TWICE DAILY. 16 g 5  . furosemide (LASIX) 40 MG tablet Take 1 tablet (40 mg total) by mouth 2 (two) times daily. 180 tablet 2  . glimepiride (AMARYL) 2 MG tablet Take 1 tablet (2 mg total) by mouth daily with breakfast. 90 tablet 1  . glucose blood test strip One touch Verio test strips Check blood sugar once daily E11.9 100 each 11  . HYDROcodone-homatropine (HYCODAN) 5-1.5 MG/5ML syrup 5 ml po q 6 hrs prn cough 120 mL 0  . metFORMIN (GLUCOPHAGE) 1000 MG tablet TAKE ONE AND ONE-HALF TABLETS BY MOUTH EVERY MORNING AND 1 TALBET IN THE EVENING. 225 tablet 1  . metoprolol tartrate (LOPRESSOR) 25 MG tablet Take 0.5 tablets (12.5 mg total) by mouth 2 (two) times daily. 30 tablet 11  . montelukast (SINGULAIR) 10 MG tablet Take 1 tablet (10 mg total) by mouth at bedtime. 90 tablet 1  . nitroGLYCERIN (NITROSTAT) 0.4 MG SL tablet Place 0.4 mg under the tongue every 5 (five) minutes as needed for chest pain.    Glory Rosebush DELICA LANCETS FINE MISC Check blood sugar once daily Dx E11.9 100 each 11  . potassium chloride SA (K-DUR,KLOR-CON) 20 MEQ tablet TAKE ONE TABLET BY MOUTH TWICE DAILY 60 tablet 5  . pregabalin (LYRICA) 75 MG capsule Take 75 mg by mouth 2 (two) times daily.    . psyllium (HYDROCIL/METAMUCIL) 95 % PACK Take 1 packet by mouth 2 (two) times daily.    [name] ZETIA 10 MG tablet TAKE ONE TABLET BY MOUTH DAILY 30 tablet 3   No current facility-administered medications on file prior to visit.    BP 162/68 mmHg  Pulse 86  Temp(Src) 97.8 F (36.6 C) (Oral)  Ht 6' (1.829 m)  Wt 286 lb 8 oz (129.956 kg)  BMI 38.85 kg/m2  SpO2 92%       Objective:   Physical Exam  General Mental Status- Alert. General Appearance- Not in acute distress.   Skin General: Color- Normal Color. Moisture- Normal  Moisture.  Neck Carotid Arteries- Normal color. Moisture- Normal Moisture. No carotid bruits. No JVD.  Chest and Lung Exam Auscultation: Breath Sounds:-Normal.  Cardiovascular Auscultation:Rythm- Regular. Murmurs & Other Heart Sounds:Auscultation of the heart reveals- No Murmurs.  Abdomen Inspection:-Inspeection Normal. Palpation/Percussion:Note:No mass. Palpation and Percussion of the abdomen reveal- Non Tender, Non Distended + BS, no rebound or guarding.    Neurologic Cranial Nerve exam:- CN III-XII intact(No nystagmus), symmetric smile. Drift Test:- No drift. Romberg Exam:- Negative.  Heal to Toe Gait exam:-Normal. Finger to Nose:- Normal/Intact Strength:- 5/5 equal and symmetric strength both upper and lower extremities.  Lower ext- 1 + pedal edema symmetric. Negative homans signs bilaterally.      Assessment & Plan:  With your recent mild anemia and fatigue will get cbc and iron studies.  For fatigue will also include cmp and tsh.  With mild edema of lower legs and faint occasional sob will get cxr.  After labs back will make decision on referring you back to Dr. Marin Olp.  Follow up in 2 wks with pcp or as needed with myself.

## 2015-07-28 NOTE — Progress Notes (Signed)
Pre visit review using our clinic review tool, if applicable. No additional management support is needed unless otherwise documented below in the visit note. 

## 2015-07-31 ENCOUNTER — Telehealth: Payer: Self-pay | Admitting: Medical

## 2015-07-31 DIAGNOSIS — D649 Anemia, unspecified: Secondary | ICD-10-CM

## 2015-08-01 NOTE — Telephone Encounter (Signed)
Let pt know that Dr. Marin Olp is reviewing the referral request. They will call pt.

## 2015-08-01 NOTE — Telephone Encounter (Signed)
Pleas Koch, I was wondering if you were working on his referral request? Does Dr. Marin Olp office have the request?

## 2015-08-01 NOTE — Telephone Encounter (Signed)
Yes they have the referral and Dr Marin Olp is reviewing  They will call the patient to schedule

## 2015-08-02 NOTE — Telephone Encounter (Signed)
Pt agreed to referral on 07/28/15 when lab results were given.

## 2015-08-04 DIAGNOSIS — M1712 Unilateral primary osteoarthritis, left knee: Secondary | ICD-10-CM | POA: Diagnosis not present

## 2015-08-14 ENCOUNTER — Other Ambulatory Visit: Payer: Self-pay | Admitting: Family Medicine

## 2015-08-16 ENCOUNTER — Ambulatory Visit: Payer: Medicare Other | Admitting: Pulmonary Disease

## 2015-08-16 ENCOUNTER — Telehealth: Payer: Self-pay | Admitting: Family Medicine

## 2015-08-16 ENCOUNTER — Encounter: Payer: Self-pay | Admitting: Pulmonary Disease

## 2015-08-16 ENCOUNTER — Ambulatory Visit (INDEPENDENT_AMBULATORY_CARE_PROVIDER_SITE_OTHER): Payer: Medicare Other | Admitting: Pulmonary Disease

## 2015-08-16 VITALS — BP 170/90 | HR 79 | Ht 72.0 in | Wt 270.0 lb

## 2015-08-16 DIAGNOSIS — I10 Essential (primary) hypertension: Secondary | ICD-10-CM

## 2015-08-16 DIAGNOSIS — G4733 Obstructive sleep apnea (adult) (pediatric): Secondary | ICD-10-CM | POA: Diagnosis not present

## 2015-08-16 NOTE — Patient Instructions (Signed)
Rx for nasal pads We will repeat titration study & get you a new CPAP

## 2015-08-16 NOTE — Telephone Encounter (Signed)
[facility] - Client TELEPHONE ADVICE RECORD   [facility]     Patient Name: [facility] - Client    Client Site [facility] - Day    Physician [place], [place] Type Call    Who Is Calling Patient / Member / Family / Caregiver  Gender: Male Call Type Triage / Clinical  DOB: 18-Sep-1940  Relationship To Patient Self  Age: 75 Y 24 D Return Phone Number [phone] (Primary)  Return Phone Number: [phone] (Primary) Chief Complaint Blood Pressure High  Address:  Reason for Call Symptomatic / Request for Health Information  City/State/Zip: [place]  Translation No    Nurse Assessment  Nurse: Amalia Hailey, RN, Melissa Date/Time (Eastern Time): 08/16/2015 4:41:00 PM  Confirm and document reason for call. If symptomatic, describe symptoms. You must click the next button to save text entered. ---Caller reports his BP is 172/98 and checked again 170/98  Has the patient traveled out of the country within the last 30 days? ---Not Applicable  Does the patient have any new or worsening symptoms? ---Yes  Will a triage be completed? ---Yes  Related visit to physician within the last 2 weeks? ---Yes  Does the PT have any chronic conditions? (i.e. diabetes, asthma, etc.) ---Yes  List chronic conditions. ---high cholesterol, hypertension, Heart Bypass surgery- 5 2001 , Heart Cath 2016, Diabetic Type 2, CPAP-sleep apnea,  Is this a behavioral health or substance abuse call? ---No    Guidelines      Guideline Title Affirmed Question Affirmed Notes Nurse Date/Time ([place] Time)        Disp. Time Eilene Ghazi Time) Disposition Final User

## 2015-08-16 NOTE — Progress Notes (Signed)
   Subjective:    Patient ID: Richard [name], male    DOB: April 29, 1941, 75 y.o.   MRN: [id number]  HPI  75 year old male  For FU of obstructive sleep apnea. He was diagnosed in 2008   Chief Complaint  Patient presents with  . Follow-up    doing well on CPAP.  Mask is leaking.  Patient says he has to wear a bandaid on his nose because the mask presses on his nose.   He had been using oxygen blended into CPAP since 2013 this was stopped in 2016 after nocturnal oximetry did not show any desaturations. He has been feeling well. Complains of mask leak from around the bridge of nose, also complains of pressure sore in that area  Otherwise does not have any dryness, he has not lost much weight He is wondering if he can get a new CPAP machine  CPAP download shows good compliance, without any residual apneas, he has a mild leak  Blood pressure was high today, he went on recheck  Significant tests/ events 2008 PSG  ONO on CPAP /RA 08/2014 >> no desatn - dc O2  Review of Systems neg for any significant sore throat, dysphagia, itching, sneezing, nasal congestion or excess/ purulent secretions, fever, chills, sweats, unintended wt loss, pleuritic or exertional cp, hempoptysis, orthopnea pnd or change in chronic leg swelling.   Also denies presyncope, palpitations, heartburn, abdominal pain, nausea, vomiting, diarrhea or change in bowel or urinary habits, dysuria,hematuria, rash, arthralgias, visual complaints, headache, numbness weakness or ataxia.     Objective:   Physical Exam  Gen. Pleasant, obese, in no distress ENT - no lesions, no post nasal drip Neck: No JVD, no thyromegaly, no carotid bruits Lungs: no use of accessory muscles, no dullness to percussion, decreased without rales or rhonchi  Cardiovascular: Rhythm regular, heart sounds  normal, no murmurs or gallops, no peripheral edema Musculoskeletal: No deformities, no cyanosis or clubbing , no tremors       Assessment & Plan:

## 2015-08-16 NOTE — Assessment & Plan Note (Signed)
Rx for nasal pads We will repeat titration study & get you a new CPAP  Weight loss encouraged, compliance with goal of at least 4-6 hrs every night is the expectation. Advised against medications with sedative side effects Cautioned against driving when sleepy - understanding that sleepiness will vary on a day to day basis   Greater than 50% time was spent in counseling and coordination of care with the patient

## 2015-08-17 ENCOUNTER — Telehealth: Payer: Self-pay | Admitting: Family Medicine

## 2015-08-17 ENCOUNTER — Ambulatory Visit: Payer: Self-pay | Admitting: Family Medicine

## 2015-08-17 NOTE — Telephone Encounter (Signed)
Pt called to cancel appt for today at [facility] location. He does not need to come in. He states that he forgot to take his BP meds yesterday and that's why his BP was up. Today pt states it is back to normal at 154/79. Pt declined follow up with Dr. Etter Sjogren in the near future. Just wants to cancel for today.

## 2015-08-17 NOTE — Telephone Encounter (Signed)
To MD to advise.      KP 

## 2015-08-17 NOTE — Assessment & Plan Note (Signed)
Does not remember if he took his blood pressure meds today We'll follow up with PCP

## 2015-08-23 ENCOUNTER — Other Ambulatory Visit (HOSPITAL_BASED_OUTPATIENT_CLINIC_OR_DEPARTMENT_OTHER): Payer: Medicare Other

## 2015-08-23 ENCOUNTER — Ambulatory Visit (HOSPITAL_BASED_OUTPATIENT_CLINIC_OR_DEPARTMENT_OTHER): Payer: Medicare Other | Admitting: Hematology & Oncology

## 2015-08-23 ENCOUNTER — Ambulatory Visit: Payer: Medicare Other

## 2015-08-23 ENCOUNTER — Encounter: Payer: Self-pay | Admitting: Pulmonary Disease

## 2015-08-23 ENCOUNTER — Encounter: Payer: Self-pay | Admitting: Hematology & Oncology

## 2015-08-23 ENCOUNTER — Ambulatory Visit (HOSPITAL_BASED_OUTPATIENT_CLINIC_OR_DEPARTMENT_OTHER): Payer: Medicare Other

## 2015-08-23 VITALS — BP 143/70 | HR 89 | Temp 98.2°F | Resp 16 | Ht 72.0 in | Wt 276.0 lb

## 2015-08-23 DIAGNOSIS — R718 Other abnormality of red blood cells: Secondary | ICD-10-CM | POA: Diagnosis not present

## 2015-08-23 DIAGNOSIS — D519 Vitamin B12 deficiency anemia, unspecified: Secondary | ICD-10-CM

## 2015-08-23 DIAGNOSIS — D510 Vitamin B12 deficiency anemia due to intrinsic factor deficiency: Secondary | ICD-10-CM

## 2015-08-23 DIAGNOSIS — D508 Other iron deficiency anemias: Secondary | ICD-10-CM

## 2015-08-23 DIAGNOSIS — D518 Other vitamin B12 deficiency anemias: Secondary | ICD-10-CM

## 2015-08-23 DIAGNOSIS — D509 Iron deficiency anemia, unspecified: Secondary | ICD-10-CM

## 2015-08-23 LAB — CBC WITH DIFFERENTIAL (CANCER CENTER ONLY)
BASO#: 0 10*3/uL (ref 0.0–0.2)
BASO%: 0.3 % (ref 0.0–2.0)
EOS%: 1.3 % (ref 0.0–7.0)
Eosinophils Absolute: 0.1 10*3/uL (ref 0.0–0.5)
HCT: 39.5 % (ref 38.7–49.9)
HEMOGLOBIN: 12.5 g/dL — AB (ref 13.0–17.1)
LYMPH#: 1.6 10*3/uL (ref 0.9–3.3)
LYMPH%: 14.7 % (ref 14.0–48.0)
MCH: 23.7 pg — AB (ref 28.0–33.4)
MCHC: 31.6 g/dL — AB (ref 32.0–35.9)
MCV: 75 fL — ABNORMAL LOW (ref 82–98)
MONO#: 0.7 10*3/uL (ref 0.1–0.9)
MONO%: 6.5 % (ref 0.0–13.0)
NEUT%: 77.2 % (ref 40.0–80.0)
NEUTROS ABS: 8.3 10*3/uL — AB (ref 1.5–6.5)
PLATELETS: 317 10*3/uL (ref 145–400)
RBC: 5.27 10*6/uL (ref 4.20–5.70)
RDW: 18.9 % — AB (ref 11.1–15.7)
WBC: 10.7 10*3/uL — ABNORMAL HIGH (ref 4.0–10.0)

## 2015-08-23 LAB — COMPREHENSIVE METABOLIC PANEL (CC13)
A/G RATIO: 1.6 (ref 1.1–2.5)
ALK PHOS: 82 IU/L (ref 39–117)
ALT: 23 IU/L (ref 0–44)
AST: 24 IU/L (ref 0–40)
Albumin, Serum: 4.4 g/dL (ref 3.5–4.8)
BILIRUBIN TOTAL: 0.4 mg/dL (ref 0.0–1.2)
BUN/Creatinine Ratio: 17 (ref 10–22)
BUN: 20 mg/dL (ref 8–27)
CHLORIDE: 103 mmol/L (ref 96–106)
Calcium, Ser: 9.9 mg/dL (ref 8.6–10.2)
Carbon Dioxide, Total: 25 mmol/L (ref 18–29)
Creatinine, Ser: 1.21 mg/dL (ref 0.76–1.27)
GFR calc Af Amer: 66 mL/min/{1.73_m2} (ref >59)
GFR calc non Af Amer: 57 mL/min/{1.73_m2} — ABNORMAL LOW (ref >59)
GLUCOSE: 137 mg/dL — AB (ref 65–99)
Globulin, Total: 2.8 g/dL (ref 1.5–4.5)
POTASSIUM: 4 mmol/L (ref 3.5–5.2)
Sodium: 138 mmol/L (ref 134–144)
TOTAL PROTEIN: 7.2 g/dL (ref 6.0–8.5)

## 2015-08-23 LAB — CHCC SATELLITE - SMEAR

## 2015-08-23 MED ORDER — CYANOCOBALAMIN 1000 MCG/ML IJ SOLN
INTRAMUSCULAR | Status: AC
  Filled 2015-08-23: qty 1

## 2015-08-23 MED ORDER — CYANOCOBALAMIN 1000 MCG/ML IJ SOLN
1000.0000 ug | Freq: Once | INTRAMUSCULAR | Status: AC
  Administered 2015-08-23: 1000 ug via INTRAMUSCULAR

## 2015-08-23 NOTE — Progress Notes (Signed)
Referral MD  Reason for Referral: Recurrent iron deficiency anemia secondary to blood donations and pernicious anemia   Chief Complaint  Patient presents with  . OTHER    New Patient  : I was told to come back here because I have low iron.  HPI: Richard [name] is well-known to me. I have not seen him for 3-1/2 years. Last saw him back in August 2013. He is fine to IV iron very nicely.  He does have a low vitamin B-12 level. We checked them in the past for anti-intrinsic factor antibodies. This was negative. I'm not sure if we checked him for parietal cell antibodies.  He donates blood. He donated about 4 times last year. When he was in for detonation in October, he was told that he was anemic and cannot donate.  At that point, he went to his family doctor. His found have low iron.  His ferritin was 18. His iron saturation was 8%. He also was found to have a low B-12 level of 103. He was then referred back to the [facility] for further evaluation.  He has had no obvious bleeding. He did have a colonoscopy last year. He reports this is being normal.  He does not chew ice. He's been playing golf. He's had no mouth sores. He's had no tingling in the hands or feet. He's had no nausea or vomiting. There's been no change in his medications. He does not smoke.  He did have back surgery since her last saw him. He has had cardiac cath which reports as open coronary arteries.  He's had no issues with traveling. He plays golf a lot.  Overall, his performance status is ECOG 1.    Past Medical History  Diagnosis Date  . Gout   . Esophageal stricture     X 4  . Mixed hyperlipidemia   . Peptic stricture of esophagus   . Antritis (stomach)     mild  . Duodenitis     chronic  . Diverticulosis     mild, left colon  . Anemia   . HTN (hypertension)   . Sliding hiatal hernia   . GERD (gastroesophageal reflux disease)   . CAD (coronary artery disease)   . HNP (herniated  nucleus pulposus), lumbar   . Adenomatous colon polyp 05/01/2015    tubular  . Vitamin B12 deficiency   . Spinal stenosis   . Skin cancer     "right neck cut off; several frozen off my arms" (03/15/2014)  . OSA on CPAP     with 2L O2 at night  . Type II diabetes mellitus ([place])   . Migraines     "til age 2 yrs"  . Arthritis     "knees, hips, shoulders" (03/15/2014)  . History of gout   . Chronic lower back pain   . Renal insufficiency   . Wenckebach block     a. brady down to 30s due to frequent block, metoprolol held in response.  :  Past Surgical History  Procedure Laterality Date  . Vasectomy    . Coronary artery bypass graft  03/2000    "CABG X5"  . Esophageal dilation  X 3-4    Dr. Lyla Son; "last one was in the 1990's"  . Esophagogastroduodenoscopy      multiple  . Flexible sigmoidoscopy      multiple  . Panendoscopy    . Knee arthroscopy Left 2011    meniscus repair  .  Shoulder surgery Right 08/2010    screws placed; "tendons tore off"  . Colonoscopy w/ biopsies and polypectomy  2013  . Upper gi endoscopy  2013    Gastritis; Dr Carlean Purl  . Myelogram  04/06/13    lumbar, Dr Gladstone Lighter  . Lumbar laminectomy/decompression microdiscectomy Right 06/17/2013    Procedure: LUMBAR LAMINECTOMY MICRODISCECTOMY L4-L5 RIGHT EXCISION OF SYNOVIAL CYST RIGHT   (1 LEVEL) RIGHT PARTIAL FACETECTOMY;  Surgeon: Tobi Bastos, MD;  Location: [facility];  Service: Orthopedics;  Laterality: Right;  . Back surgery    . Appendectomy  ~ 1952  . Tonsillectomy  ~ 1954  . Coronary angioplasty  1993  . Coronary angioplasty with stent placement  05/1997    "1"  . Skin cancer excision Right     "neck"  . Left heart catheterization with coronary/graft angiogram N/A 03/16/2014    Procedure: LEFT HEART CATHETERIZATION WITH Beatrix Fetters;  Surgeon: Burnell Blanks, MD;  Location: [facility] CATH LAB;  Service: Cardiovascular;  Laterality: N/A;  :   Current outpatient prescriptions:  .   allopurinol (ZYLOPRIM) 300 MG tablet, Take 450 mg by mouth daily. , Disp: , Rfl:  .  amLODipine (NORVASC) 5 MG tablet, Take 2 tablets (10 mg total) by mouth daily., Disp: 60 tablet, Rfl: 5 .  Ascorbic Acid (VITAMIN C) 500 MG tablet, Take 500 mg by mouth every morning. , Disp: , Rfl:  .  aspirin 81 MG tablet, Take 81 mg by mouth daily., Disp: , Rfl:  .  atorvastatin (LIPITOR) 80 MG tablet, Take 1 tablet (80 mg total) by mouth at bedtime., Disp: 90 tablet, Rfl: 1 .  atorvastatin (LIPITOR) 80 MG tablet, TAKE ONE TABLET BY MOUTH AT BEDTIME, Disp: 90 tablet, Rfl: 1 .  esomeprazole (NEXIUM) 40 MG capsule, Take 1 capsule (40 mg total) by mouth daily., Disp: 30 capsule, Rfl: 5 .  fenofibrate 160 MG tablet, TAKE 1 TABLET BY MOUTH EVERY DAY, Disp: 30 tablet, Rfl: 5 .  fexofenadine (ALLEGRA ALLERGY) 180 MG tablet, Take 1 tablet (180 mg total) by mouth daily., Disp: , Rfl:  .  fluticasone (FLONASE) 50 MCG/ACT nasal spray, INHALE ONE SPRAY INTO EACH NOSTRIL TWICE DAILY., Disp: 16 g, Rfl: 5 .  FREESTYLE LITE test strip, TEST ONE TIME DAILY, Disp: 100 each, Rfl: 3 .  furosemide (LASIX) 40 MG tablet, Take 1 tablet (40 mg total) by mouth 2 (two) times daily., Disp: 180 tablet, Rfl: 2 .  glimepiride (AMARYL) 2 MG tablet, Take 1 tablet (2 mg total) by mouth daily with breakfast., Disp: 90 tablet, Rfl: 1 .  metFORMIN (GLUCOPHAGE) 1000 MG tablet, TAKE ONE AND ONE-HALF TABLETS BY MOUTH EVERY MORNING AND 1 TALBET IN THE EVENING., Disp: 225 tablet, Rfl: 1 .  metoprolol tartrate (LOPRESSOR) 25 MG tablet, Take 0.5 tablets (12.5 mg total) by mouth 2 (two) times daily., Disp: 30 tablet, Rfl: 11 .  montelukast (SINGULAIR) 10 MG tablet, Take 1 tablet (10 mg total) by mouth at bedtime., Disp: 90 tablet, Rfl: 1 .  nitroGLYCERIN (NITROSTAT) 0.4 MG SL tablet, Place 0.4 mg under the tongue every 5 (five) minutes as needed for chest pain., Disp: , Rfl:  .  ONETOUCH DELICA LANCETS FINE MISC, Check blood sugar once daily Dx E11.9, Disp:  100 each, Rfl: 11 .  potassium chloride SA (K-DUR,KLOR-CON) 20 MEQ tablet, TAKE ONE TABLET BY MOUTH TWICE DAILY, Disp: 60 tablet, Rfl: 5 .  pregabalin (LYRICA) 75 MG capsule, Take 75 mg by mouth 2 (two) times  daily., Disp: , Rfl:  .  psyllium (HYDROCIL/METAMUCIL) 95 % PACK, Take 1 packet by mouth 2 (two) times daily., Disp: , Rfl:  .  ZETIA 10 MG tablet, TAKE ONE TABLET BY MOUTH DAILY, Disp: 30 tablet, Rfl: 3:  :  Allergies  Allergen Reactions  . Benazepril     angioedema; he is not a candidate for any angiotensin receptor blockers because of this significant allergic reaction. Because of a history of documented adverse serious drug reaction;Medi Alert bracelet  is recommended  . Hctz [Hydrochlorothiazide] Swelling    Tongue and lip swelling   . Aspirin Other (See Comments)    Gastritis, cant take 325 Mg aspirin   :  Family History  Problem Relation Age of Onset  . Stroke Father   . Hypertension Father   . Pancreatic cancer Mother   . Diabetes Maternal Grandmother   . Stroke Maternal Grandmother   . Heart attack Paternal Grandmother   . Colon cancer Neg Hx   . Esophageal cancer Neg Hx   . Rectal cancer Neg Hx   . Stomach cancer Neg Hx   . Ulcers Neg Hx   :  Social History   Social History  . Marital Status: Divorced    Spouse Name: N/A  . Number of Children: 2  . Years of Education: N/A   Occupational History  .     Social History Main Topics  . Smoking status: Never Smoker   . Smokeless tobacco: Never Used  . Alcohol Use: 0.0 oz/week    0 Standard drinks or equivalent per week     Comment: "last drink was in 2012"( 03/15/2014)  . Drug Use: No  . Sexual Activity: Not Currently   Other Topics Concern  . Not on file   Social History Narrative   Divorced, lives with a roommate.   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  :  Pertinent items are noted in HPI.  Exam: @IPVITALS @  obese white male in no obvious distress. Vital signs show a temperature of 98.2. Pulse 89. Blood pressure 143/70. Weight is 276 pounds. Head and neck exam shows no ocular or oral lesions. He has no palpable cervical or supraclavicular lymph nodes. Lungs are clear. Cardiac exam regular in rhythm with a normal S1 and S2. There are no murmurs, rubs or bruits. Abdomen is soft. He is obese. Has good bowel sounds. There is no fluid wave. There is no palpable liver or spleen tip. Back exam shows a laminectomy scar in the lumbar spine. No tenderness is noted over the spine, ribs or hips. Extremity shows no clubbing, cyanosis or edema. He may have some chronic nonpitting edema of his lower legs. Skin exam shows no rashes, ecchymoses or petechia. Neurological exam shows no focal neurological deficits.    Recent Labs  08/23/15 1447  WBC 10.7*  HGB 12.5*  HCT 39.5  PLT 317   No results for input(s): NA, K, CL, CO2, GLUCOSE, BUN, CREATININE, CALCIUM in the last 72 hours.  Blood smear review:  Mild anisocytosis and poikilocytosis. She has no target cells. He has no nucleated red blood cells. He has no rouleau formation. I see no schistocytes. There are no inclusion bodies. White cells are normal in morphology maturation. There may be a rare hypersegmented polys. I do not  see any immature myeloid or lymphoid forms. Platelets are adequate in number and size.  Pathology: None     Assessment and Plan:  Richard [name] is a 75 year old white male. He has both iron deficiency anemia and pernicious anemia. The iron deficiency anemia is from his blood donations. I applaud him for donating blood. I think it is wonderful that he has been doing this. However, I think it got his iron levels too low. I told him not to donate blood until I let him know when it is okay.  We will go ahead and give him some iron. I'll have to set this  up depending on his iron levels.  As far as the pernicious anemia goes, I suspect that he probably has some autoimmune issue. He does have diabetes.  We'll start him on vitamin B-12 injections. I'll start him on the protocol for daily injections for 1 week and then weekly for 1 month and then monthly.  I will send off intrinsic factor antibodies and parietal cell antibodies when I see him back.  I would like to see him back in 2 months. I think this should be reasonable. I would think that his iron levels should be up to where they need to be and that his hemoglobin should be better.   I spent about 45 minutes with him. It was good to see him again.

## 2015-08-23 NOTE — Patient Instructions (Signed)

## 2015-08-24 ENCOUNTER — Other Ambulatory Visit: Payer: Self-pay | Admitting: *Deleted

## 2015-08-24 ENCOUNTER — Ambulatory Visit (HOSPITAL_BASED_OUTPATIENT_CLINIC_OR_DEPARTMENT_OTHER): Payer: Medicare Other

## 2015-08-24 VITALS — BP 142/68 | HR 77 | Temp 98.2°F | Resp 18

## 2015-08-24 DIAGNOSIS — D510 Vitamin B12 deficiency anemia due to intrinsic factor deficiency: Secondary | ICD-10-CM

## 2015-08-24 DIAGNOSIS — D509 Iron deficiency anemia, unspecified: Secondary | ICD-10-CM

## 2015-08-24 LAB — IRON AND TIBC
%SAT: 9 % — AB (ref 20–55)
Iron: 40 ug/dL — ABNORMAL LOW (ref 42–163)
TIBC: 424 ug/dL — ABNORMAL HIGH (ref 202–409)
UIBC: 384 ug/dL — AB (ref 117–376)

## 2015-08-24 LAB — FERRITIN: Ferritin: 20 ng/ml — ABNORMAL LOW (ref 22–316)

## 2015-08-24 LAB — LACTATE DEHYDROGENASE: LDH: 158 U/L (ref 125–245)

## 2015-08-24 LAB — RETICULOCYTES: RETICULOCYTE COUNT: 0.9 % (ref 0.6–2.6)

## 2015-08-24 LAB — VITAMIN B12: VITAMIN B 12: 157 pg/mL — AB (ref 211–946)

## 2015-08-24 LAB — ERYTHROPOIETIN: ERYTHROPOIETIN: 19.1 m[IU]/mL — AB (ref 2.6–18.5)

## 2015-08-24 MED ORDER — CYANOCOBALAMIN 1000 MCG/ML IJ SOLN
1000.0000 ug | Freq: Once | INTRAMUSCULAR | Status: AC
  Administered 2015-08-24: 1000 ug via INTRAMUSCULAR

## 2015-08-24 MED ORDER — CYANOCOBALAMIN 1000 MCG/ML IJ SOLN
INTRAMUSCULAR | Status: AC
  Filled 2015-08-24: qty 1

## 2015-08-24 NOTE — Patient Instructions (Signed)

## 2015-08-25 ENCOUNTER — Ambulatory Visit (HOSPITAL_BASED_OUTPATIENT_CLINIC_OR_DEPARTMENT_OTHER): Payer: Medicare Other

## 2015-08-25 ENCOUNTER — Other Ambulatory Visit: Payer: Self-pay | Admitting: *Deleted

## 2015-08-25 VITALS — BP 132/61 | HR 82 | Temp 97.8°F | Resp 18

## 2015-08-25 DIAGNOSIS — Z52090 Other blood donor, whole blood: Secondary | ICD-10-CM

## 2015-08-25 DIAGNOSIS — D509 Iron deficiency anemia, unspecified: Secondary | ICD-10-CM

## 2015-08-25 DIAGNOSIS — D508 Other iron deficiency anemias: Secondary | ICD-10-CM

## 2015-08-25 DIAGNOSIS — D510 Vitamin B12 deficiency anemia due to intrinsic factor deficiency: Secondary | ICD-10-CM

## 2015-08-25 MED ORDER — CYANOCOBALAMIN 1000 MCG/ML IJ SOLN
1000.0000 ug | Freq: Once | INTRAMUSCULAR | Status: AC
  Administered 2015-08-25: 1000 ug via INTRAMUSCULAR

## 2015-08-25 MED ORDER — CYANOCOBALAMIN 1000 MCG/ML IJ SOLN
INTRAMUSCULAR | Status: AC
  Filled 2015-08-25: qty 1

## 2015-08-25 MED ORDER — SODIUM CHLORIDE 0.9 % IV SOLN
510.0000 mg | Freq: Once | INTRAVENOUS | Status: AC
  Administered 2015-08-25: 510 mg via INTRAVENOUS
  Filled 2015-08-25: qty 17

## 2015-08-25 MED ORDER — SODIUM CHLORIDE 0.9 % IV SOLN
INTRAVENOUS | Status: DC
  Administered 2015-08-25: 15:00:00 via INTRAVENOUS

## 2015-08-25 NOTE — Patient Instructions (Signed)

## 2015-08-28 ENCOUNTER — Ambulatory Visit (HOSPITAL_BASED_OUTPATIENT_CLINIC_OR_DEPARTMENT_OTHER): Payer: Medicare Other

## 2015-08-28 VITALS — BP 158/70 | HR 83 | Temp 98.0°F | Resp 18

## 2015-08-28 DIAGNOSIS — D510 Vitamin B12 deficiency anemia due to intrinsic factor deficiency: Secondary | ICD-10-CM | POA: Diagnosis not present

## 2015-08-28 DIAGNOSIS — D509 Iron deficiency anemia, unspecified: Secondary | ICD-10-CM

## 2015-08-28 MED ORDER — CYANOCOBALAMIN 1000 MCG/ML IJ SOLN
INTRAMUSCULAR | Status: AC
  Filled 2015-08-28: qty 1

## 2015-08-28 MED ORDER — CYANOCOBALAMIN 1000 MCG/ML IJ SOLN
1000.0000 ug | Freq: Once | INTRAMUSCULAR | Status: AC
  Administered 2015-08-28: 1000 ug via INTRAMUSCULAR

## 2015-08-28 NOTE — Patient Instructions (Signed)

## 2015-08-29 ENCOUNTER — Ambulatory Visit (HOSPITAL_BASED_OUTPATIENT_CLINIC_OR_DEPARTMENT_OTHER): Payer: Medicare Other

## 2015-08-29 VITALS — BP 142/64 | HR 77 | Temp 97.6°F | Resp 18

## 2015-08-29 DIAGNOSIS — D510 Vitamin B12 deficiency anemia due to intrinsic factor deficiency: Secondary | ICD-10-CM | POA: Diagnosis not present

## 2015-08-29 DIAGNOSIS — D509 Iron deficiency anemia, unspecified: Secondary | ICD-10-CM

## 2015-08-29 MED ORDER — CYANOCOBALAMIN 1000 MCG/ML IJ SOLN
1000.0000 ug | Freq: Once | INTRAMUSCULAR | Status: AC
  Administered 2015-08-29: 1000 ug via INTRAMUSCULAR

## 2015-08-29 MED ORDER — CYANOCOBALAMIN 1000 MCG/ML IJ SOLN
INTRAMUSCULAR | Status: AC
  Filled 2015-08-29: qty 1

## 2015-08-29 NOTE — Patient Instructions (Signed)

## 2015-08-30 ENCOUNTER — Ambulatory Visit (HOSPITAL_BASED_OUTPATIENT_CLINIC_OR_DEPARTMENT_OTHER): Payer: Medicare Other

## 2015-08-30 DIAGNOSIS — D510 Vitamin B12 deficiency anemia due to intrinsic factor deficiency: Secondary | ICD-10-CM

## 2015-08-30 DIAGNOSIS — D509 Iron deficiency anemia, unspecified: Secondary | ICD-10-CM

## 2015-08-30 MED ORDER — CYANOCOBALAMIN 1000 MCG/ML IJ SOLN
INTRAMUSCULAR | Status: AC
  Filled 2015-08-30: qty 1

## 2015-08-30 MED ORDER — CYANOCOBALAMIN 1000 MCG/ML IJ SOLN
1000.0000 ug | Freq: Once | INTRAMUSCULAR | Status: AC
  Administered 2015-08-30: 1000 ug via INTRAMUSCULAR

## 2015-08-30 NOTE — Patient Instructions (Signed)

## 2015-08-31 ENCOUNTER — Ambulatory Visit (HOSPITAL_BASED_OUTPATIENT_CLINIC_OR_DEPARTMENT_OTHER): Payer: Medicare Other

## 2015-08-31 DIAGNOSIS — D510 Vitamin B12 deficiency anemia due to intrinsic factor deficiency: Secondary | ICD-10-CM | POA: Diagnosis not present

## 2015-08-31 DIAGNOSIS — D509 Iron deficiency anemia, unspecified: Secondary | ICD-10-CM

## 2015-08-31 MED ORDER — CYANOCOBALAMIN 1000 MCG/ML IJ SOLN
INTRAMUSCULAR | Status: AC
  Filled 2015-08-31: qty 1

## 2015-08-31 MED ORDER — CYANOCOBALAMIN 1000 MCG/ML IJ SOLN
1000.0000 ug | Freq: Once | INTRAMUSCULAR | Status: AC
  Administered 2015-08-31: 1000 ug via INTRAMUSCULAR

## 2015-08-31 NOTE — Patient Instructions (Signed)

## 2015-09-01 ENCOUNTER — Other Ambulatory Visit: Payer: Self-pay | Admitting: *Deleted

## 2015-09-01 ENCOUNTER — Ambulatory Visit (HOSPITAL_BASED_OUTPATIENT_CLINIC_OR_DEPARTMENT_OTHER): Payer: Medicare Other

## 2015-09-01 VITALS — BP 145/68 | HR 46 | Temp 98.2°F | Resp 18

## 2015-09-01 DIAGNOSIS — D509 Iron deficiency anemia, unspecified: Secondary | ICD-10-CM

## 2015-09-01 DIAGNOSIS — D508 Other iron deficiency anemias: Secondary | ICD-10-CM

## 2015-09-01 DIAGNOSIS — Z52090 Other blood donor, whole blood: Secondary | ICD-10-CM

## 2015-09-01 MED ORDER — SODIUM CHLORIDE 0.9 % IV SOLN
510.0000 mg | Freq: Once | INTRAVENOUS | Status: AC
  Administered 2015-09-01: 510 mg via INTRAVENOUS
  Filled 2015-09-01: qty 17

## 2015-09-01 MED ORDER — SODIUM CHLORIDE 0.9 % IV SOLN
INTRAVENOUS | Status: DC
  Administered 2015-09-01: 15:00:00 via INTRAVENOUS

## 2015-09-01 NOTE — Patient Instructions (Signed)

## 2015-09-04 ENCOUNTER — Other Ambulatory Visit (HOSPITAL_BASED_OUTPATIENT_CLINIC_OR_DEPARTMENT_OTHER): Payer: Medicare Other

## 2015-09-04 ENCOUNTER — Other Ambulatory Visit: Payer: Self-pay | Admitting: Hematology & Oncology

## 2015-09-04 ENCOUNTER — Ambulatory Visit (HOSPITAL_BASED_OUTPATIENT_CLINIC_OR_DEPARTMENT_OTHER): Payer: Medicare Other

## 2015-09-04 VITALS — BP 130/67 | HR 92 | Temp 98.0°F | Resp 18

## 2015-09-04 DIAGNOSIS — D509 Iron deficiency anemia, unspecified: Secondary | ICD-10-CM

## 2015-09-04 DIAGNOSIS — D519 Vitamin B12 deficiency anemia, unspecified: Secondary | ICD-10-CM

## 2015-09-04 DIAGNOSIS — D510 Vitamin B12 deficiency anemia due to intrinsic factor deficiency: Secondary | ICD-10-CM

## 2015-09-04 LAB — CBC WITH DIFFERENTIAL (CANCER CENTER ONLY)
BASO#: 0 10*3/uL (ref 0.0–0.2)
BASO%: 0.3 % (ref 0.0–2.0)
EOS%: 1.7 % (ref 0.0–7.0)
Eosinophils Absolute: 0.2 10*3/uL (ref 0.0–0.5)
HCT: 39 % (ref 38.7–49.9)
HGB: 12.3 g/dL — ABNORMAL LOW (ref 13.0–17.1)
LYMPH#: 1.4 10*3/uL (ref 0.9–3.3)
LYMPH%: 14.6 % (ref 14.0–48.0)
MCH: 24.5 pg — ABNORMAL LOW (ref 28.0–33.4)
MCHC: 31.5 g/dL — AB (ref 32.0–35.9)
MCV: 78 fL — ABNORMAL LOW (ref 82–98)
MONO#: 0.8 10*3/uL (ref 0.1–0.9)
MONO%: 8.3 % (ref 0.0–13.0)
NEUT%: 75.1 % (ref 40.0–80.0)
NEUTROS ABS: 7.1 10*3/uL — AB (ref 1.5–6.5)
PLATELETS: 270 10*3/uL (ref 145–400)
RBC: 5.02 10*6/uL (ref 4.20–5.70)
RDW: 20.5 % — ABNORMAL HIGH (ref 11.1–15.7)
WBC: 9.5 10*3/uL (ref 4.0–10.0)

## 2015-09-04 MED ORDER — CYANOCOBALAMIN 1000 MCG/ML IJ SOLN
1000.0000 ug | Freq: Once | INTRAMUSCULAR | Status: AC
  Administered 2015-09-04: 1000 ug via INTRAMUSCULAR

## 2015-09-04 NOTE — Patient Instructions (Signed)

## 2015-09-05 MED ORDER — CYANOCOBALAMIN 1000 MCG/ML IJ SOLN
INTRAMUSCULAR | Status: AC
  Filled 2015-09-05: qty 1

## 2015-09-08 ENCOUNTER — Other Ambulatory Visit: Payer: Self-pay | Admitting: *Deleted

## 2015-09-08 DIAGNOSIS — D509 Iron deficiency anemia, unspecified: Secondary | ICD-10-CM

## 2015-09-11 ENCOUNTER — Ambulatory Visit (HOSPITAL_BASED_OUTPATIENT_CLINIC_OR_DEPARTMENT_OTHER): Payer: Medicare Other

## 2015-09-11 ENCOUNTER — Other Ambulatory Visit (HOSPITAL_BASED_OUTPATIENT_CLINIC_OR_DEPARTMENT_OTHER): Payer: Medicare Other

## 2015-09-11 VITALS — BP 124/56 | HR 72 | Temp 98.0°F | Resp 16

## 2015-09-11 DIAGNOSIS — D510 Vitamin B12 deficiency anemia due to intrinsic factor deficiency: Secondary | ICD-10-CM

## 2015-09-11 DIAGNOSIS — D509 Iron deficiency anemia, unspecified: Secondary | ICD-10-CM

## 2015-09-11 LAB — CBC WITH DIFFERENTIAL (CANCER CENTER ONLY)
BASO#: 0 10*3/uL (ref 0.0–0.2)
BASO%: 0.4 % (ref 0.0–2.0)
EOS%: 1.4 % (ref 0.0–7.0)
Eosinophils Absolute: 0.1 10*3/uL (ref 0.0–0.5)
HCT: 41 % (ref 38.7–49.9)
HEMOGLOBIN: 13.1 g/dL (ref 13.0–17.1)
LYMPH#: 1.5 10*3/uL (ref 0.9–3.3)
LYMPH%: 18.2 % (ref 14.0–48.0)
MCH: 25 pg — ABNORMAL LOW (ref 28.0–33.4)
MCHC: 32 g/dL (ref 32.0–35.9)
MCV: 78 fL — ABNORMAL LOW (ref 82–98)
MONO#: 0.8 10*3/uL (ref 0.1–0.9)
MONO%: 9.5 % (ref 0.0–13.0)
NEUT#: 6 10*3/uL (ref 1.5–6.5)
NEUT%: 70.5 % (ref 40.0–80.0)
Platelets: 261 10*3/uL (ref 145–400)
RBC: 5.23 10*6/uL (ref 4.20–5.70)
RDW: 21 % — AB (ref 11.1–15.7)
WBC: 8.5 10*3/uL (ref 4.0–10.0)

## 2015-09-11 MED ORDER — CYANOCOBALAMIN 1000 MCG/ML IJ SOLN
1000.0000 ug | Freq: Once | INTRAMUSCULAR | Status: AC
  Administered 2015-09-11: 1000 ug via INTRAMUSCULAR

## 2015-09-11 MED ORDER — CYANOCOBALAMIN 1000 MCG/ML IJ SOLN
INTRAMUSCULAR | Status: AC
  Filled 2015-09-11: qty 1

## 2015-09-11 NOTE — Patient Instructions (Signed)

## 2015-09-15 ENCOUNTER — Other Ambulatory Visit: Payer: Self-pay | Admitting: *Deleted

## 2015-09-15 DIAGNOSIS — D509 Iron deficiency anemia, unspecified: Secondary | ICD-10-CM

## 2015-09-18 ENCOUNTER — Ambulatory Visit (HOSPITAL_BASED_OUTPATIENT_CLINIC_OR_DEPARTMENT_OTHER): Payer: Medicare Other

## 2015-09-18 ENCOUNTER — Encounter: Payer: Self-pay | Admitting: Family Medicine

## 2015-09-18 ENCOUNTER — Ambulatory Visit (HOSPITAL_BASED_OUTPATIENT_CLINIC_OR_DEPARTMENT_OTHER)
Admission: RE | Admit: 2015-09-18 | Discharge: 2015-09-18 | Disposition: A | Discharge status: Home / Self Care | Payer: Medicare Other | Source: Ambulatory Visit | Attending: Family Medicine | Admitting: Family Medicine

## 2015-09-18 ENCOUNTER — Ambulatory Visit (INDEPENDENT_AMBULATORY_CARE_PROVIDER_SITE_OTHER): Payer: Medicare Other | Admitting: Family Medicine

## 2015-09-18 ENCOUNTER — Other Ambulatory Visit (HOSPITAL_BASED_OUTPATIENT_CLINIC_OR_DEPARTMENT_OTHER): Payer: Medicare Other

## 2015-09-18 VITALS — BP 140/72 | HR 74 | Temp 97.8°F | Ht 72.0 in | Wt 295.0 lb

## 2015-09-18 VITALS — BP 130/63 | HR 50 | Temp 98.4°F | Resp 16

## 2015-09-18 DIAGNOSIS — Z23 Encounter for immunization: Secondary | ICD-10-CM

## 2015-09-18 DIAGNOSIS — D510 Vitamin B12 deficiency anemia due to intrinsic factor deficiency: Secondary | ICD-10-CM

## 2015-09-18 DIAGNOSIS — D519 Vitamin B12 deficiency anemia, unspecified: Secondary | ICD-10-CM | POA: Diagnosis not present

## 2015-09-18 DIAGNOSIS — R600 Localized edema: Secondary | ICD-10-CM

## 2015-09-18 DIAGNOSIS — D509 Iron deficiency anemia, unspecified: Secondary | ICD-10-CM

## 2015-09-18 DIAGNOSIS — E785 Hyperlipidemia, unspecified: Secondary | ICD-10-CM

## 2015-09-18 DIAGNOSIS — IMO0002 Reserved for concepts with insufficient information to code with codable children: Secondary | ICD-10-CM

## 2015-09-18 DIAGNOSIS — E1151 Type 2 diabetes mellitus with diabetic peripheral angiopathy without gangrene: Secondary | ICD-10-CM | POA: Diagnosis not present

## 2015-09-18 DIAGNOSIS — I10 Essential (primary) hypertension: Secondary | ICD-10-CM | POA: Diagnosis not present

## 2015-09-18 DIAGNOSIS — E1165 Type 2 diabetes mellitus with hyperglycemia: Secondary | ICD-10-CM

## 2015-09-18 DIAGNOSIS — M7989 Other specified soft tissue disorders: Secondary | ICD-10-CM | POA: Diagnosis not present

## 2015-09-18 DIAGNOSIS — D518 Other vitamin B12 deficiency anemias: Secondary | ICD-10-CM

## 2015-09-18 LAB — LIPID PANEL
CHOL/HDL RATIO: 4
CHOLESTEROL: 123 mg/dL (ref 0–200)
HDL: 31.8 mg/dL — ABNORMAL LOW (ref >39.00)
NonHDL: 90.84
TRIGLYCERIDES: 205 mg/dL — AB (ref 0.0–149.0)
VLDL: 41 mg/dL — ABNORMAL HIGH (ref 0.0–40.0)

## 2015-09-18 LAB — CBC WITH DIFFERENTIAL (CANCER CENTER ONLY)
BASO#: 0 10*3/uL (ref 0.0–0.2)
BASO%: 0.4 % (ref 0.0–2.0)
EOS ABS: 0.1 10*3/uL (ref 0.0–0.5)
EOS%: 1.6 % (ref 0.0–7.0)
HEMATOCRIT: 41.4 % (ref 38.7–49.9)
HGB: 13 g/dL (ref 13.0–17.1)
LYMPH#: 1.8 10*3/uL (ref 0.9–3.3)
LYMPH%: 21.2 % (ref 14.0–48.0)
MCH: 25.1 pg — ABNORMAL LOW (ref 28.0–33.4)
MCHC: 31.4 g/dL — AB (ref 32.0–35.9)
MCV: 80 fL — AB (ref 82–98)
MONO#: 0.7 10*3/uL (ref 0.1–0.9)
MONO%: 8.2 % (ref 0.0–13.0)
NEUT#: 5.9 10*3/uL (ref 1.5–6.5)
NEUT%: 68.6 % (ref 40.0–80.0)
Platelets: 254 10*3/uL (ref 145–400)
RBC: 5.17 10*6/uL (ref 4.20–5.70)
RDW: 22 % — AB (ref 11.1–15.7)
WBC: 8.5 10*3/uL (ref 4.0–10.0)

## 2015-09-18 LAB — COMPREHENSIVE METABOLIC PANEL
ALBUMIN: 4.1 g/dL (ref 3.5–5.2)
ALK PHOS: 73 U/L (ref 39–117)
ALT: 22 U/L (ref 0–53)
AST: 22 U/L (ref 0–37)
BILIRUBIN TOTAL: 0.5 mg/dL (ref 0.2–1.2)
BUN: 11 mg/dL (ref 6–23)
CALCIUM: 9.5 mg/dL (ref 8.4–10.5)
CO2: 26 mEq/L (ref 19–32)
CREATININE: 1 mg/dL (ref 0.40–1.50)
Chloride: 103 mEq/L (ref 96–112)
GFR: 76.98 mL/min (ref >60.00)
Glucose, Bld: 101 mg/dL — ABNORMAL HIGH (ref 70–99)
Potassium: 4.1 mEq/L (ref 3.5–5.1)
Sodium: 137 mEq/L (ref 135–145)
TOTAL PROTEIN: 7 g/dL (ref 6.0–8.3)

## 2015-09-18 LAB — CBC WITH DIFFERENTIAL/PLATELET
BASOS ABS: 0 10*3/uL (ref 0.0–0.1)
BASOS PCT: 0.4 % (ref 0.0–3.0)
EOS ABS: 0.1 10*3/uL (ref 0.0–0.7)
Eosinophils Relative: 1.6 % (ref 0.0–5.0)
HCT: 39.5 % (ref 39.0–52.0)
Hemoglobin: 12.7 g/dL — ABNORMAL LOW (ref 13.0–17.0)
LYMPHS PCT: 20 % (ref 12.0–46.0)
Lymphs Abs: 1.6 10*3/uL (ref 0.7–4.0)
MCHC: 32.2 g/dL (ref 30.0–36.0)
MCV: 77.6 fl — ABNORMAL LOW (ref 78.0–100.0)
MONO ABS: 0.6 10*3/uL (ref 0.1–1.0)
Monocytes Relative: 8.2 % (ref 3.0–12.0)
NEUTROS ABS: 5.5 10*3/uL (ref 1.4–7.7)
NEUTROS PCT: 69.8 % (ref 43.0–77.0)
PLATELETS: 311 10*3/uL (ref 150.0–400.0)
RBC: 5.09 Mil/uL (ref 4.22–5.81)
RDW: 23 % — AB (ref 11.5–15.5)
WBC: 7.9 10*3/uL (ref 4.0–10.5)

## 2015-09-18 LAB — LDL CHOLESTEROL, DIRECT: Direct LDL: 71 mg/dL

## 2015-09-18 LAB — HEMOGLOBIN A1C: HEMOGLOBIN A1C: 7 % — AB (ref 4.6–6.5)

## 2015-09-18 LAB — VITAMIN B12: Vitamin B-12: 355 pg/mL (ref 211–911)

## 2015-09-18 IMAGING — US US SOFT TISSUE HEAD/NECK
1 series · 13 of 25 positions shown · non-contrast
Comparison: None available

CLINICAL DATA: Left neck nodules seen on outside carotid study

EXAM:
THYROID ULTRASOUND
TECHNIQUE: Ultrasound examination of the thyroid gland and adjacent soft
tissues was performed.

[Series 1: us soft tissue head/neck · 0.06mm/px · 13 of 42 slices shown]
[im 1/42]
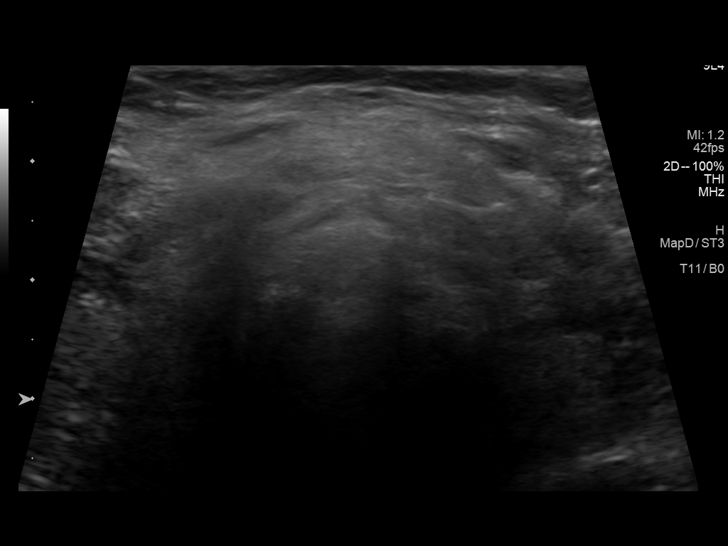
[im 4/42]
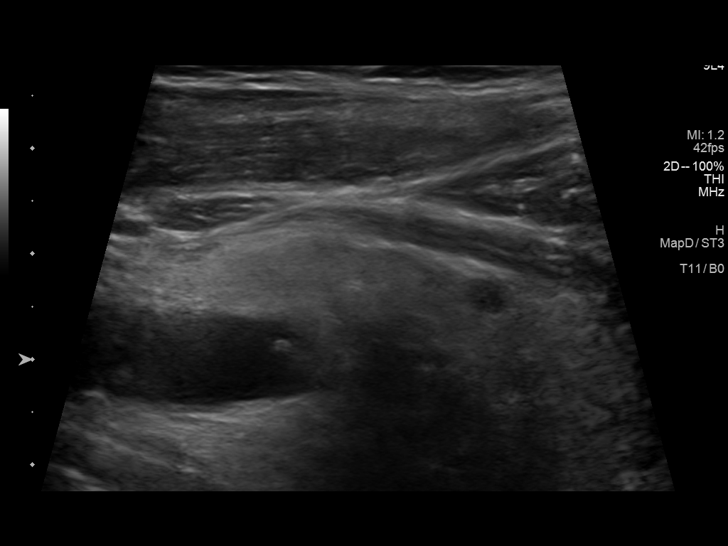
[im 7/42]
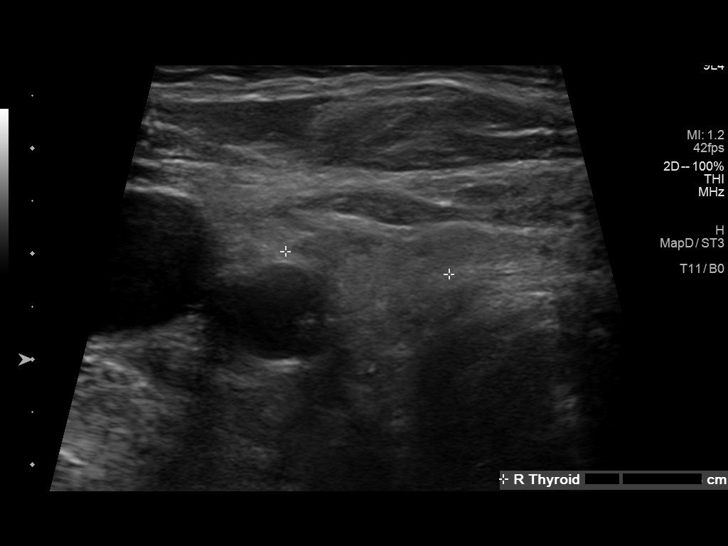
[im 11/42]
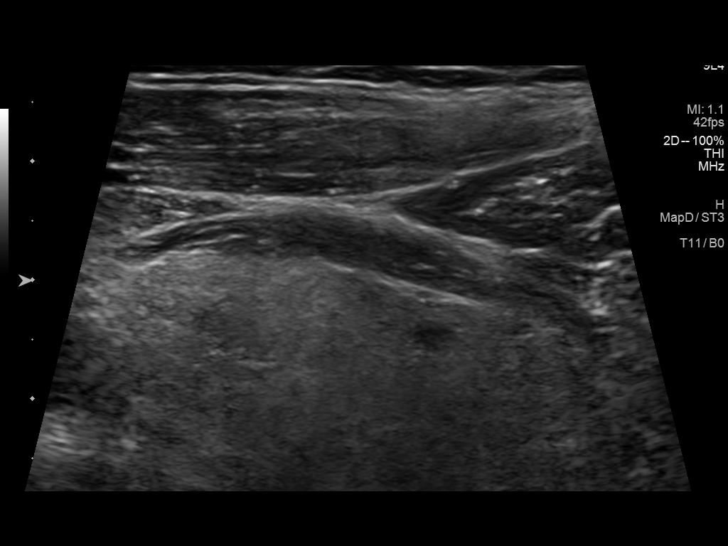
[im 14/42]
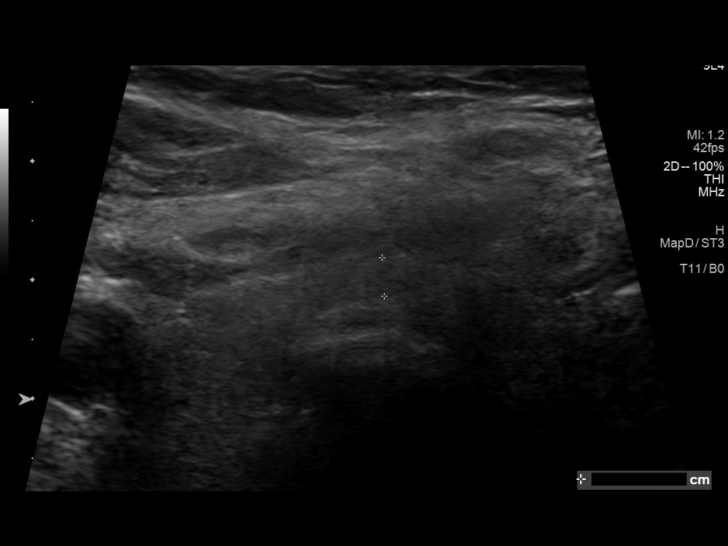
[im 18/42]
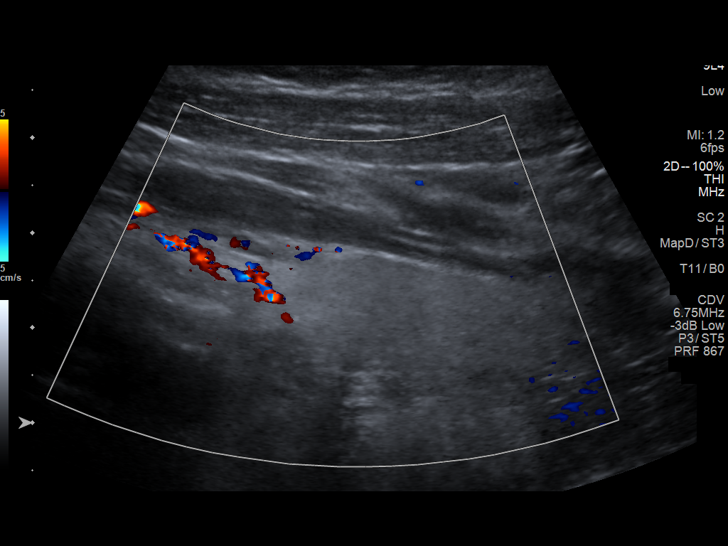
[im 21/42]
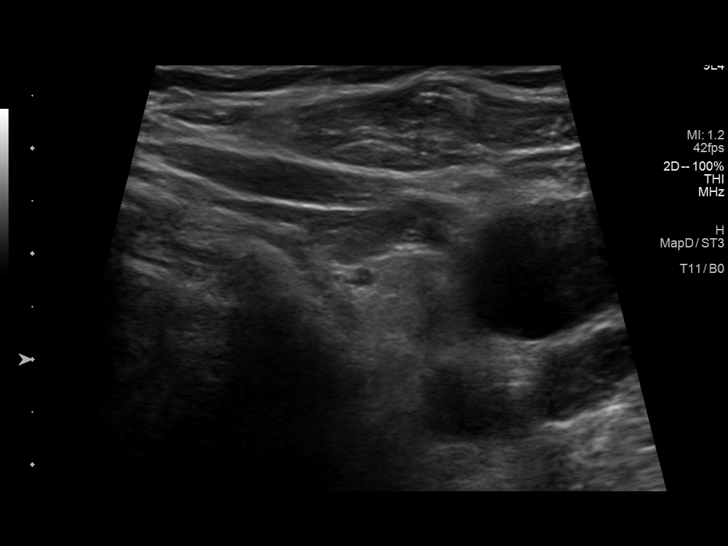
[im 24/42]
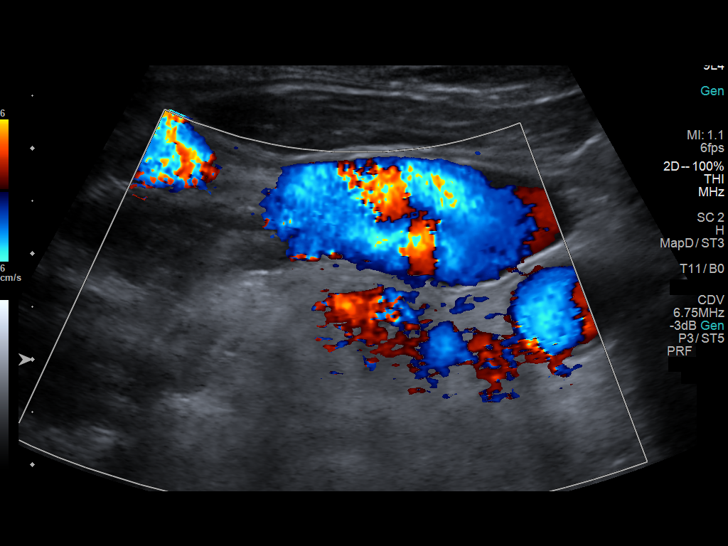
[im 28/42]
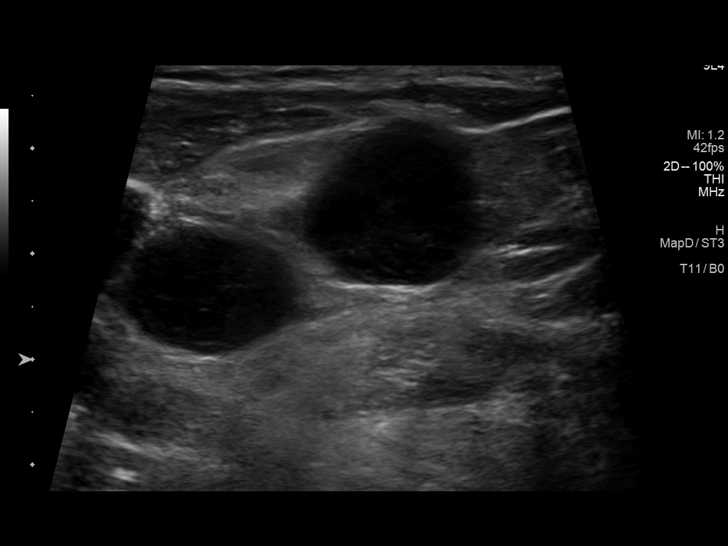
[im 31/42]
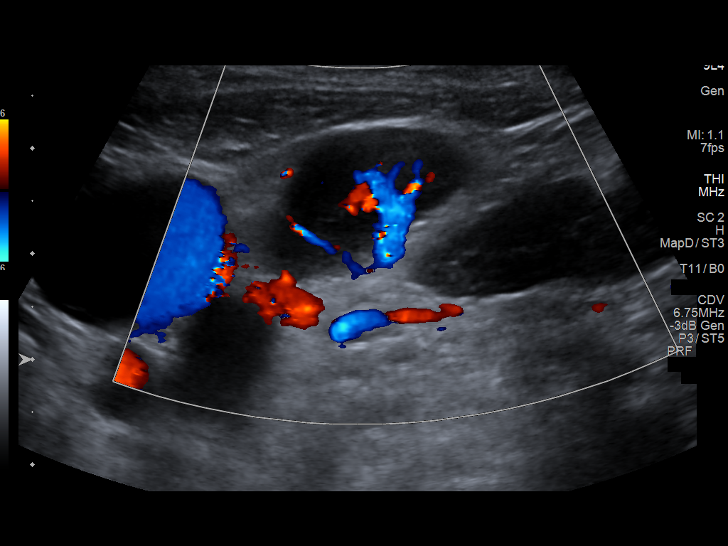
[im 35/42]
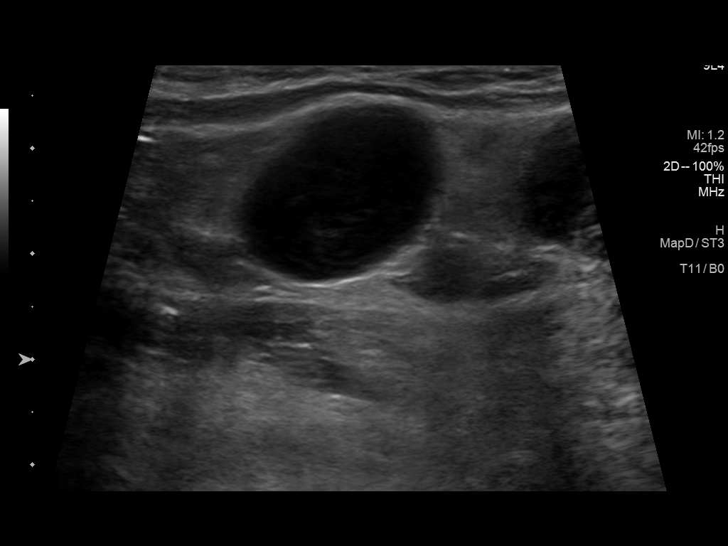
[im 38/42]
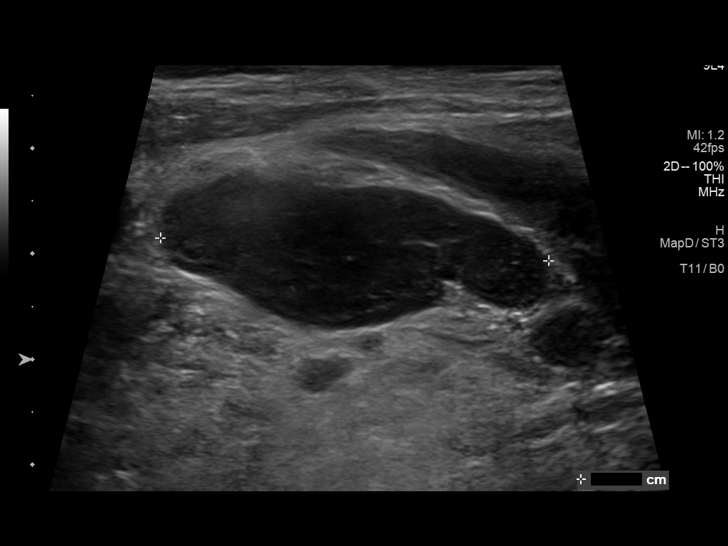
[im 42/42]
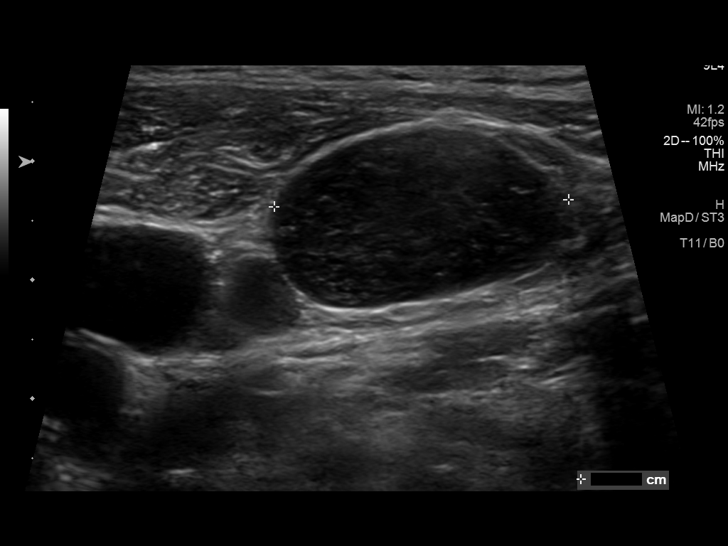

[13 of 25 positions shown; findings below may reference images not displayed]

FINDINGS: Right thyroid lobe

Measurements: 43 x 21 x 16 mm. 4 x 3 mm hypoechoic nodule, mid lobe.

Left thyroid lobe

Measurements: 40 x 18 x 15 mm.  No nodules visualized.

Isthmus

Thickness: 3 mm.  No nodules visualized.

Lymphadenopathy

Hypoechoic left cervical adenopathy with at least 3 nodes measuring
greater than 15 mm short axis diameter.
IMPRESSION: 1. Normal-sized thyroid with a single 4 mm right nodule. Findings do
not meet current consensus criteria for biopsy. Follow-up by
clinical exam is recommended. If patient has known risk factors for
thyroid carcinoma, consider follow-up ultrasound in 12 months. If
patient is clinically hyperthyroid, consider nuclear medicine
thyroid uptake and scan. This recommendation follows the consensus
statement: Management of Thyroid Nodules Detected as US: Society of
Radiologists in Ultrasound Consensus Conference Statement. Radiology
9775; [DATE].
2. Pathologic left cervical adenopathy.  Follow-up recommended.

## 2015-09-18 MED ORDER — FUROSEMIDE 40 MG PO TABS: 80.0000 mg | ORAL_TABLET | Freq: Two times a day (BID) | ORAL | Status: DC

## 2015-09-18 MED ORDER — CYANOCOBALAMIN 1000 MCG/ML IJ SOLN
1000.0000 ug | Freq: Once | INTRAMUSCULAR | Status: AC
  Administered 2015-09-18: 1000 ug via INTRAMUSCULAR

## 2015-09-18 MED ORDER — CYANOCOBALAMIN 1000 MCG/ML IJ SOLN
INTRAMUSCULAR | Status: AC
  Filled 2015-09-18: qty 1

## 2015-09-18 NOTE — Patient Instructions (Signed)

## 2015-09-18 NOTE — Progress Notes (Signed)
Patient ID: Richard [name], male    DOB: 04-Aug-1940  Age: 75 y.o. MRN: [id number]    Subjective:  Subjective HPI SABIN GIBEAULT presents for f/u dm, cholesterol and htn.  Pt c/o weight gain-- he admits to eating poorly and c/o edema.  He used to take lasix 80 bid and it was decreased when he lost some weight HYPERTENSION  Blood pressure range-not checking  Chest pain- no      Dyspnea- onoLightheadedness-    Edema- yes Other side effects - no   Medication compliance: good Low salt diet- no  DIABETES  Blood Sugar ranges-120s  Polyuria- no New Visual problems- no Hypoglycemic symptoms- o Other side effects-no Medication compliance - good Last eye exam- next one next week Foot exam- today  HYPERLIPIDEMIA  Medication compliance- good RUQ pain- no  Muscle aches- no Other side effects-no   Review of Systems  Constitutional: Negative for diaphoresis, appetite change, fatigue and unexpected weight change.  Eyes: Negative for pain, redness and visual disturbance.  Respiratory: Negative for cough, chest tightness, shortness of breath and wheezing.   Cardiovascular: Positive for leg swelling. Negative for chest pain and palpitations.  Endocrine: Negative for cold intolerance, heat intolerance, polydipsia, polyphagia and polyuria.  Genitourinary: Negative for dysuria, frequency and difficulty urinating.  Neurological: Negative for dizziness, light-headedness, numbness and headaches.   . History Past Medical History  Diagnosis Date  . Gout   . Esophageal stricture     X 4  . Mixed hyperlipidemia   . Peptic stricture of esophagus   . Antritis (stomach)     mild  . Duodenitis     chronic  . Diverticulosis     mild, left colon  . Anemia   . HTN (hypertension)   . Sliding hiatal hernia   . GERD (gastroesophageal reflux disease)   . CAD (coronary artery disease)   . HNP (herniated nucleus pulposus), lumbar   . Adenomatous colon polyp 05/01/2015    tubular  . Vitamin B12  deficiency   . Spinal stenosis   . Skin cancer     "right neck cut off; several frozen off my arms" (03/15/2014)  . OSA on CPAP     with 2L O2 at night  . Type II diabetes mellitus ([place])   . Migraines     "til age 75 yrs"  . Arthritis     "knees, hips, shoulders" (03/15/2014)  . History of gout   . Chronic lower back pain   . Renal insufficiency   . Wenckebach block     a. brady down to 30s due to frequent block, metoprolol held in response.    He has past surgical history that includes Vasectomy; Coronary artery bypass graft (03/2000); Esophageal dilation (X 3-4); Esophagogastroduodenoscopy; Flexible sigmoidoscopy; Panendoscopy; Knee arthroscopy (Left, 2011); Shoulder surgery (Right, 08/2010); Colonoscopy w/ biopsies and polypectomy (2013); Upper gi endoscopy (2013); Myelogram (04/06/13); Lumbar laminectomy/decompression microdiscectomy (Right, 06/17/2013); Back surgery; Appendectomy (~ 1952); Tonsillectomy (~ 1954); Coronary angioplasty (1993); Coronary angioplasty with stent (05/1997); Skin cancer excision (Right); and left heart catheterization with coronary/graft angiogram (N/A, 03/16/2014).   His family history includes Diabetes in his maternal grandmother; Heart attack in his paternal grandmother; Hypertension in his father; Pancreatic cancer in his mother; Stroke in his father and maternal grandmother. There is no history of Colon cancer, Esophageal cancer, Rectal cancer, Stomach cancer, or Ulcers.He reports that he has never smoked. He has never used smokeless tobacco. He reports that he drinks alcohol. He reports that  he does not use illicit drugs.  Current Outpatient Prescriptions on File Prior to Visit  Medication Sig Dispense Refill  . allopurinol (ZYLOPRIM) 300 MG tablet Take 450 mg by mouth daily.     [name] amLODipine (NORVASC) 5 MG tablet Take 2 tablets (10 mg total) by mouth daily. 60 tablet 5  . Ascorbic Acid (VITAMIN C) 500 MG tablet Take 500 mg by mouth every morning.     [name]  aspirin 81 MG tablet Take 81 mg by mouth daily.    [name] atorvastatin (LIPITOR) 80 MG tablet Take 1 tablet (80 mg total) by mouth at bedtime. 90 tablet 1  . atorvastatin (LIPITOR) 80 MG tablet TAKE ONE TABLET BY MOUTH AT BEDTIME 90 tablet 1  . esomeprazole (NEXIUM) 40 MG capsule Take 1 capsule (40 mg total) by mouth daily. 30 capsule 5  . fenofibrate 160 MG tablet TAKE 1 TABLET BY MOUTH EVERY DAY 30 tablet 5  . fexofenadine (ALLEGRA ALLERGY) 180 MG tablet Take 1 tablet (180 mg total) by mouth daily.    . fluticasone (FLONASE) 50 MCG/ACT nasal spray INHALE ONE SPRAY INTO EACH NOSTRIL TWICE DAILY. 16 g 5  . FREESTYLE LITE test strip TEST ONE TIME DAILY 100 each 3  . glimepiride (AMARYL) 2 MG tablet Take 1 tablet (2 mg total) by mouth daily with breakfast. 90 tablet 1  . metFORMIN (GLUCOPHAGE) 1000 MG tablet TAKE ONE AND ONE-HALF TABLETS BY MOUTH EVERY MORNING AND 1 TALBET IN THE EVENING. 225 tablet 1  . metoprolol tartrate (LOPRESSOR) 25 MG tablet Take 0.5 tablets (12.5 mg total) by mouth 2 (two) times daily. 30 tablet 11  . montelukast (SINGULAIR) 10 MG tablet Take 1 tablet (10 mg total) by mouth at bedtime. 90 tablet 1  . nitroGLYCERIN (NITROSTAT) 0.4 MG SL tablet Place 0.4 mg under the tongue every 5 (five) minutes as needed for chest pain.    Glory Rosebush DELICA LANCETS FINE MISC Check blood sugar once daily Dx E11.9 100 each 11  . potassium chloride SA (K-DUR,KLOR-CON) 20 MEQ tablet TAKE ONE TABLET BY MOUTH TWICE DAILY 60 tablet 5  . pregabalin (LYRICA) 75 MG capsule Take 75 mg by mouth 2 (two) times daily.    . psyllium (HYDROCIL/METAMUCIL) 95 % PACK Take 1 packet by mouth 2 (two) times daily.    [name] ZETIA 10 MG tablet TAKE ONE TABLET BY MOUTH DAILY 30 tablet 3   No current facility-administered medications on file prior to visit.     Objective:  Objective Physical Exam  Constitutional: He is oriented to person, place, and time. Vital signs are normal. He appears well-developed and  well-nourished. He is sleeping.  HENT:  Head: Normocephalic and atraumatic.  Mouth/Throat: Oropharynx is clear and moist.  Eyes: EOM are normal. Pupils are equal, round, and reactive to light.  Neck: Normal range of motion. Neck supple. No thyromegaly present.  Cardiovascular: Normal rate and regular rhythm.   Murmur heard. Pulmonary/Chest: Effort normal and breath sounds normal. No respiratory distress. He has no wheezes. He has no rales. He exhibits no tenderness.  Musculoskeletal: He exhibits edema. He exhibits no tenderness.  Neurological: He is alert and oriented to person, place, and time.  Skin: Skin is warm and dry.  Psychiatric: He has a normal mood and affect. His behavior is normal. Judgment and thought content normal.   BP 140/72 mmHg  Pulse 74  Temp(Src) 97.8 F (36.6 C) (Oral)  Ht 6' (1.829 m)  Wt 295 lb (133.811 kg)  BMI 40.00 kg/m2  SpO2 96% Wt Readings from Last 3 Encounters:  09/18/15 295 lb (133.811 kg)  08/23/15 276 lb (125.193 kg)  08/16/15 270 lb (122.471 kg)     Lab Results  Component Value Date   WBC 8.5 09/18/2015   HGB 13.0 09/18/2015   HCT 41.4 09/18/2015   PLT 254 09/18/2015   GLUCOSE 101* 09/18/2015   CHOL 123 09/18/2015   TRIG 205.0* 09/18/2015   HDL 31.80* 09/18/2015   LDLDIRECT 71.0 09/18/2015   LDLCALC 52 12/20/2013   ALT 22 09/18/2015   AST 22 09/18/2015   NA 137 09/18/2015   K 4.1 09/18/2015   CL 103 09/18/2015   CREATININE 1.00 09/18/2015   BUN 11 09/18/2015   CO2 26 09/18/2015   TSH 2.53 07/28/2015   PSA 0.65 09/27/2013   INR 1.15 03/16/2014   HGBA1C 7.0* 09/18/2015   MICROALBUR 0.4 06/21/2014    Dg Chest 2 View  07/28/2015  CLINICAL DATA:  Shortness of breath with exertion.  Anemia. EXAM: CHEST  2 VIEW COMPARISON:  April 03, 2015 FINDINGS: The interstitium is mildly prominent in a generalized manner, a stable finding. There is no edema or consolidation. The heart size and pulmonary vascularity normal. No adenopathy. There  is atherosclerotic calcification in the aortic arch region. The patient is status post coronary artery bypass grafting. There is degenerative change in the thoracic spine. IMPRESSION: Mild chronic interstitial thickening without edema or consolidation. Stable cardiac silhouette. Electronically Signed   By: Lowella Grip III M.D.   On: 07/28/2015 11:34     Assessment & Plan:  Plan I have changed Mr. Amesquita's furosemide. I am also having him maintain his allopurinol, vitamin C, esomeprazole, psyllium, nitroGLYCERIN, aspirin, pregabalin, ONETOUCH DELICA LANCETS FINE, metoprolol tartrate, atorvastatin, fluticasone, fenofibrate, potassium chloride SA, amLODipine, fexofenadine, atorvastatin, metFORMIN, montelukast, glimepiride, ZETIA, and FREESTYLE LITE.  Meds ordered this encounter  Medications  . furosemide (LASIX) 40 MG tablet    Sig: Take 2 tablets (80 mg total) by mouth 2 (two) times daily.    Dispense:  180 tablet    Refill:  0    Problem List Items Addressed This Visit      Unprioritized   HYPERTENSION, BENIGN    con't norvasc stable      Relevant Medications   furosemide (LASIX) 40 MG tablet   Bilateral lower extremity edema    Increase lasix to 80  Mg for few weeks rto 2-3 weeks prn Elevate as much as possible         Other Visit Diagnoses    Need for pneumococcal vaccination    -  Primary    Relevant Orders    Pneumococcal conjugate vaccine 13-valent (Completed)    Macrocytic anemia with vitamin B12 deficiency        Relevant Orders    Vitamin B12 (Completed)    CBC with Differential/Platelet (Completed)    DM (diabetes mellitus) type II uncontrolled, periph vascular disorder (HCC)        Relevant Medications    furosemide (LASIX) 40 MG tablet    Other Relevant Orders    Vitamin B12 (Completed)    CBC with Differential/Platelet (Completed)    Hemoglobin A1c (Completed)    Lipid panel (Completed)    Comp Met (CMET) (Completed)    Essential hypertension         Relevant Medications    furosemide (LASIX) 40 MG tablet    Other Relevant Orders    Vitamin B12 (Completed)  POCT urinalysis dipstick    CBC with Differential/Platelet (Completed)    Hemoglobin A1c (Completed)    Lipid panel (Completed)    Comp Met (CMET) (Completed)    Hyperlipidemia LDL goal <70        Relevant Medications    furosemide (LASIX) 40 MG tablet    Other Relevant Orders    Lipid panel (Completed)    Comp Met (CMET) (Completed)    Bilateral edema of lower extremity        Relevant Orders    DG Chest 2 View (Completed)       Follow-up: Return in about 3 weeks (around 10/09/2015), or if symptoms worsen or fail to improve, for edema.  Garnet Koyanagi, DO

## 2015-09-18 NOTE — Progress Notes (Signed)
Pre visit review using our clinic review tool, if applicable. No additional management support is needed unless otherwise documented below in the visit note. 

## 2015-09-18 NOTE — Assessment & Plan Note (Addendum)
Increase lasix to 80  Mg for few weeks rto 2-3 weeks prn Elevate as much as possible

## 2015-09-18 NOTE — Patient Instructions (Signed)

## 2015-09-18 NOTE — Assessment & Plan Note (Signed)
con't norvasc stable

## 2015-09-22 ENCOUNTER — Other Ambulatory Visit: Payer: Self-pay | Admitting: *Deleted

## 2015-09-22 DIAGNOSIS — D519 Vitamin B12 deficiency anemia, unspecified: Secondary | ICD-10-CM

## 2015-09-22 DIAGNOSIS — D509 Iron deficiency anemia, unspecified: Secondary | ICD-10-CM

## 2015-09-25 ENCOUNTER — Ambulatory Visit (HOSPITAL_BASED_OUTPATIENT_CLINIC_OR_DEPARTMENT_OTHER): Payer: Medicare Other

## 2015-09-25 ENCOUNTER — Other Ambulatory Visit: Payer: Self-pay | Admitting: Family Medicine

## 2015-09-25 ENCOUNTER — Other Ambulatory Visit (HOSPITAL_BASED_OUTPATIENT_CLINIC_OR_DEPARTMENT_OTHER): Payer: Medicare Other

## 2015-09-25 VITALS — BP 146/63 | HR 76 | Temp 98.1°F | Resp 16

## 2015-09-25 DIAGNOSIS — D519 Vitamin B12 deficiency anemia, unspecified: Secondary | ICD-10-CM

## 2015-09-25 DIAGNOSIS — D509 Iron deficiency anemia, unspecified: Secondary | ICD-10-CM

## 2015-09-25 DIAGNOSIS — D510 Vitamin B12 deficiency anemia due to intrinsic factor deficiency: Secondary | ICD-10-CM

## 2015-09-25 LAB — CBC WITH DIFFERENTIAL (CANCER CENTER ONLY)
BASO#: 0 10*3/uL (ref 0.0–0.2)
BASO%: 0.3 % (ref 0.0–2.0)
EOS ABS: 0.2 10*3/uL (ref 0.0–0.5)
EOS%: 2.1 % (ref 0.0–7.0)
HEMATOCRIT: 41.7 % (ref 38.7–49.9)
HEMOGLOBIN: 13.5 g/dL (ref 13.0–17.1)
LYMPH#: 1.7 10*3/uL (ref 0.9–3.3)
LYMPH%: 19.3 % (ref 14.0–48.0)
MCH: 25.7 pg — AB (ref 28.0–33.4)
MCHC: 32.4 g/dL (ref 32.0–35.9)
MCV: 79 fL — AB (ref 82–98)
MONO#: 0.9 10*3/uL (ref 0.1–0.9)
MONO%: 10.6 % (ref 0.0–13.0)
NEUT%: 67.7 % (ref 40.0–80.0)
NEUTROS ABS: 5.9 10*3/uL (ref 1.5–6.5)
Platelets: 281 10*3/uL (ref 145–400)
RBC: 5.26 10*6/uL (ref 4.20–5.70)
RDW: 21.6 % — ABNORMAL HIGH (ref 11.1–15.7)
WBC: 8.8 10*3/uL (ref 4.0–10.0)

## 2015-09-25 MED ORDER — CYANOCOBALAMIN 1000 MCG/ML IJ SOLN
1000.0000 ug | Freq: Once | INTRAMUSCULAR | Status: AC
Start: 2015-09-25 — End: 2015-09-25
  Administered 2015-09-25: 1000 ug via INTRAMUSCULAR

## 2015-09-25 NOTE — Patient Instructions (Signed)

## 2015-10-06 DIAGNOSIS — H2513 Age-related nuclear cataract, bilateral: Secondary | ICD-10-CM | POA: Diagnosis not present

## 2015-10-06 LAB — HM DIABETES EYE EXAM

## 2015-10-09 ENCOUNTER — Ambulatory Visit (INDEPENDENT_AMBULATORY_CARE_PROVIDER_SITE_OTHER): Payer: Medicare Other | Admitting: Family Medicine

## 2015-10-09 ENCOUNTER — Encounter: Payer: Self-pay | Admitting: Family Medicine

## 2015-10-09 VITALS — BP 138/64 | HR 74 | Temp 97.7°F | Ht 72.0 in | Wt 272.4 lb

## 2015-10-09 DIAGNOSIS — E876 Hypokalemia: Secondary | ICD-10-CM | POA: Diagnosis not present

## 2015-10-09 DIAGNOSIS — R609 Edema, unspecified: Secondary | ICD-10-CM | POA: Insufficient documentation

## 2015-10-09 LAB — BASIC METABOLIC PANEL
BUN: 23 mg/dL (ref 6–23)
CHLORIDE: 101 meq/L (ref 96–112)
CO2: 26 meq/L (ref 19–32)
Calcium: 10.1 mg/dL (ref 8.4–10.5)
Creatinine, Ser: 1.13 mg/dL (ref 0.40–1.50)
GFR: 66.84 mL/min (ref >60.00)
GLUCOSE: 169 mg/dL — AB (ref 70–99)
POTASSIUM: 3.8 meq/L (ref 3.5–5.1)
SODIUM: 139 meq/L (ref 135–145)

## 2015-10-09 NOTE — Progress Notes (Signed)
Pre visit review using our clinic review tool, if applicable. No additional management support is needed unless otherwise documented below in the visit note. 

## 2015-10-09 NOTE — Progress Notes (Signed)
Patient ID: Richard [name], male    DOB: 05-21-1941  Age: 75 y.o. MRN: [id number]    Subjective:  Subjective HPI Richard [name] presents for f/u edema---it is much better since increasing the lasix.  -- he has been eating better and has lost 27 lbs between lasix and diet.    Review of Systems  Constitutional: Negative for diaphoresis, appetite change, fatigue and unexpected weight change.  Eyes: Negative for pain, redness and visual disturbance.  Respiratory: Negative for cough, chest tightness, shortness of breath and wheezing.   Cardiovascular: Negative for chest pain, palpitations and leg swelling.  Endocrine: Negative for cold intolerance, heat intolerance, polydipsia, polyphagia and polyuria.  Genitourinary: Negative for dysuria, frequency and difficulty urinating.  Musculoskeletal: Arthralgias: 27   Neurological: Negative for dizziness, light-headedness, numbness and headaches.    History Past Medical History  Diagnosis Date  . Gout   . Esophageal stricture     X 4  . Mixed hyperlipidemia   . Peptic stricture of esophagus   . Antritis (stomach)     mild  . Duodenitis     chronic  . Diverticulosis     mild, left colon  . Anemia   . HTN (hypertension)   . Sliding hiatal hernia   . GERD (gastroesophageal reflux disease)   . CAD (coronary artery disease)   . HNP (herniated nucleus pulposus), lumbar   . Adenomatous colon polyp 05/01/2015    tubular  . Vitamin B12 deficiency   . Spinal stenosis   . Skin cancer     "right neck cut off; several frozen off my arms" (03/15/2014)  . OSA on CPAP     with 2L O2 at night  . Type II diabetes mellitus ([place])   . Migraines     "til age 35 yrs"  . Arthritis     "knees, hips, shoulders" (03/15/2014)  . History of gout   . Chronic lower back pain   . Renal insufficiency   . Wenckebach block     a. brady down to 30s due to frequent block, metoprolol held in response.    He has past surgical history that includes Vasectomy;  Coronary artery bypass graft (03/2000); Esophageal dilation (X 3-4); Esophagogastroduodenoscopy; Flexible sigmoidoscopy; Panendoscopy; Knee arthroscopy (Left, 2011); Shoulder surgery (Right, 08/2010); Colonoscopy w/ biopsies and polypectomy (2013); Upper gi endoscopy (2013); Myelogram (04/06/13); Lumbar laminectomy/decompression microdiscectomy (Right, 06/17/2013); Back surgery; Appendectomy (~ 1952); Tonsillectomy (~ 1954); Coronary angioplasty (1993); Coronary angioplasty with stent (05/1997); Skin cancer excision (Right); and left heart catheterization with coronary/graft angiogram (N/A, 03/16/2014).   His family history includes Diabetes in his maternal grandmother; Heart attack in his paternal grandmother; Hypertension in his father; Pancreatic cancer in his mother; Stroke in his father and maternal grandmother. There is no history of Colon cancer, Esophageal cancer, Rectal cancer, Stomach cancer, or Ulcers.He reports that he has never smoked. He has never used smokeless tobacco. He reports that he drinks alcohol. He reports that he does not use illicit drugs.  Current Outpatient Prescriptions on File Prior to Visit  Medication Sig Dispense Refill  . allopurinol (ZYLOPRIM) 300 MG tablet Take 450 mg by mouth daily.     [name] amLODipine (NORVASC) 5 MG tablet Take 2 tablets (10 mg total) by mouth daily. 60 tablet 5  . Ascorbic Acid (VITAMIN C) 500 MG tablet Take 500 mg by mouth every morning.     [name] aspirin 81 MG tablet Take 81 mg by mouth daily.    [name] atorvastatin (  LIPITOR) 80 MG tablet Take 1 tablet (80 mg total) by mouth at bedtime. 90 tablet 1  . atorvastatin (LIPITOR) 80 MG tablet TAKE ONE TABLET BY MOUTH AT BEDTIME 90 tablet 1  . esomeprazole (NEXIUM) 40 MG capsule Take 1 capsule (40 mg total) by mouth daily. 30 capsule 5  . fenofibrate 160 MG tablet TAKE 1 TABLET BY MOUTH EVERY DAY 30 tablet 5  . fexofenadine (ALLEGRA ALLERGY) 180 MG tablet Take 1 tablet (180 mg total) by mouth daily.    . fluticasone  (FLONASE) 50 MCG/ACT nasal spray INHALE ONE SPRAY INTO EACH NOSTRIL TWICE DAILY. 16 g 5  . FREESTYLE LITE test strip TEST ONE TIME DAILY 100 each 3  . furosemide (LASIX) 40 MG tablet Take 2 tablets (80 mg total) by mouth 2 (two) times daily. 180 tablet 0  . glimepiride (AMARYL) 2 MG tablet Take 1 tablet (2 mg total) by mouth daily with breakfast. 90 tablet 1  . Lancets (FREESTYLE) lancets TEST ONE TIME DAILY 100 each 3  . metFORMIN (GLUCOPHAGE) 1000 MG tablet TAKE ONE AND ONE-HALF TABLETS BY MOUTH EVERY MORNING AND 1 TALBET IN THE EVENING. 225 tablet 1  . metoprolol tartrate (LOPRESSOR) 25 MG tablet Take 0.5 tablets (12.5 mg total) by mouth 2 (two) times daily. 30 tablet 11  . montelukast (SINGULAIR) 10 MG tablet Take 1 tablet (10 mg total) by mouth at bedtime. 90 tablet 1  . nitroGLYCERIN (NITROSTAT) 0.4 MG SL tablet Place 0.4 mg under the tongue every 5 (five) minutes as needed for chest pain.    . potassium chloride SA (K-DUR,KLOR-CON) 20 MEQ tablet TAKE ONE TABLET BY MOUTH TWICE DAILY 60 tablet 5  . pregabalin (LYRICA) 75 MG capsule Take 75 mg by mouth 2 (two) times daily.    . psyllium (HYDROCIL/METAMUCIL) 95 % PACK Take 1 packet by mouth 2 (two) times daily.    [name] ZETIA 10 MG tablet TAKE ONE TABLET BY MOUTH DAILY 30 tablet 3   No current facility-administered medications on file prior to visit.     Objective:  Objective Physical Exam  Constitutional: He is oriented to person, place, and time. Vital signs are normal. He appears well-developed and well-nourished. He is sleeping.  HENT:  Head: Normocephalic and atraumatic.  Mouth/Throat: Oropharynx is clear and moist.  Eyes: EOM are normal. Pupils are equal, round, and reactive to light.  Neck: Normal range of motion. Neck supple. No thyromegaly present.  Cardiovascular: Normal rate and regular rhythm.   No murmur heard. Pulmonary/Chest: Effort normal and breath sounds normal. No respiratory distress. He has no wheezes. He has no rales.  He exhibits no tenderness.  Musculoskeletal: He exhibits no edema or tenderness.  Neurological: He is alert and oriented to person, place, and time.  Skin: Skin is warm and dry.  Psychiatric: He has a normal mood and affect. His behavior is normal. Judgment and thought content normal.  Nursing note and vitals reviewed.  BP 138/64 mmHg  Pulse 74  Temp(Src) 97.7 F (36.5 C) (Oral)  Ht 6' (1.829 m)  Wt 272 lb 6.4 oz (123.56 kg)  BMI 36.94 kg/m2  SpO2 97% Wt Readings from Last 3 Encounters:  10/09/15 272 lb 6.4 oz (123.56 kg)  09/18/15 295 lb (133.811 kg)  08/23/15 276 lb (125.193 kg)     Lab Results  Component Value Date   WBC 8.8 09/25/2015   HGB 13.5 09/25/2015   HCT 41.7 09/25/2015   PLT 281 09/25/2015   GLUCOSE 169* 10/09/2015  CHOL 123 09/18/2015   TRIG 205.0* 09/18/2015   HDL 31.80* 09/18/2015   LDLDIRECT 71.0 09/18/2015   LDLCALC 52 12/20/2013   ALT 22 09/18/2015   AST 22 09/18/2015   NA 139 10/09/2015   K 3.8 10/09/2015   CL 101 10/09/2015   CREATININE 1.13 10/09/2015   BUN 23 10/09/2015   CO2 26 10/09/2015   TSH 2.53 07/28/2015   PSA 0.65 09/27/2013   INR 1.15 03/16/2014   HGBA1C 7.0* 09/18/2015   MICROALBUR 0.4 06/21/2014    Dg Chest 2 View  09/18/2015  CLINICAL DATA:  Bilateral lower extremity swelling for 2 weeks EXAM: CHEST  2 VIEW COMPARISON:  07/28/2015 FINDINGS: Upper normal heart size. Interstitial prominence is stable and likely chronic. Postoperative changes. No new consolidation or mass. No pneumothorax. No pleural effusion. IMPRESSION: No active cardiopulmonary disease. Electronically Signed   By: Marybelle Killings M.D.   On: 09/18/2015 09:37     Assessment & Plan:  Plan I am having Mr. Reineck maintain his allopurinol, vitamin C, esomeprazole, psyllium, nitroGLYCERIN, aspirin, pregabalin, metoprolol tartrate, atorvastatin, fluticasone, fenofibrate, potassium chloride SA, amLODipine, fexofenadine, atorvastatin, metFORMIN, montelukast, glimepiride,  ZETIA, FREESTYLE LITE, furosemide, and freestyle.  No orders of the defined types were placed in this encounter.    Problem List Items Addressed This Visit    None    Visit Diagnoses    Hypokalemia    -  Primary    Relevant Orders    Basic metabolic panel (Completed)       Follow-up: Return in about 6 months (around 04/09/2016), or if symptoms worsen or fail to improve.  [name], DO

## 2015-10-09 NOTE — Assessment & Plan Note (Signed)
con't lasix Much improved

## 2015-10-09 NOTE — Patient Instructions (Signed)
Edema °Edema is an abnormal buildup of fluids in your body tissues. Edema is somewhat dependent on gravity to pull the fluid to the lowest place in your body. That makes the condition more common in the legs and thighs (lower extremities). Painless swelling of the feet and ankles is common and becomes more likely as you get older. It is also common in looser tissues, like around your eyes.  °When the affected area is squeezed, the fluid may move out of that spot and leave a dent for a few moments. This dent is called pitting.  °CAUSES  °There are many possible causes of edema. Eating too much salt and being on your feet or sitting for a long time can cause edema in your legs and ankles. Hot weather may make edema worse. Common medical causes of edema include: °· Heart failure. °· Liver disease. °· Kidney disease. °· Weak blood vessels in your legs. °· Cancer. °· An injury. °· Pregnancy. °· Some medications. °· Obesity.  °SYMPTOMS  °Edema is usually painless. Your skin may look swollen or shiny.  °DIAGNOSIS  °Your health care provider may be able to diagnose edema by asking about your medical history and doing a physical exam. You may need to have tests such as X-rays, an electrocardiogram, or blood tests to check for medical conditions that may cause edema.  °TREATMENT  °Edema treatment depends on the cause. If you have heart, liver, or kidney disease, you need the treatment appropriate for these conditions. General treatment may include: °· Elevation of the affected body part above the level of your heart. °· Compression of the affected body part. Pressure from elastic bandages or support stockings squeezes the tissues and forces fluid back into the blood vessels. This keeps fluid from entering the tissues. °· Restriction of fluid and salt intake. °· Use of a water pill (diuretic). These medications are appropriate only for some types of edema. They pull fluid out of your body and make you urinate more often. This  gets rid of fluid and reduces swelling, but diuretics can have side effects. Only use diuretics as directed by your health care provider. °HOME CARE INSTRUCTIONS  °· Keep the affected body part above the level of your heart when you are lying down.   °· Do not sit still or stand for prolonged periods.   °· Do not put anything directly under your knees when lying down. °· Do not wear constricting clothing or garters on your upper legs.   °· Exercise your legs to work the fluid back into your blood vessels. This may help the swelling go down.   °· Wear elastic bandages or support stockings to reduce ankle swelling as directed by your health care provider.   °· Eat a low-salt diet to reduce fluid if your health care provider recommends it.   °· Only take medicines as directed by your health care provider.  °SEEK MEDICAL CARE IF:  °· Your edema is not responding to treatment. °· You have heart, liver, or kidney disease and notice symptoms of edema. °· You have edema in your legs that does not improve after elevating them.   °· You have sudden and unexplained weight gain. °SEEK IMMEDIATE MEDICAL CARE IF:  °· You develop shortness of breath or chest pain.   °· You cannot breathe when you lie down. °· You develop pain, redness, or warmth in the swollen areas.   °· You have heart, liver, or kidney disease and suddenly get edema. °· You have a fever and your symptoms suddenly get worse. °MAKE SURE YOU:  °·   Understand these instructions. °· Will watch your condition. °· Will get help right away if you are not doing well or get worse. °  °This information is not intended to replace advice given to you by your health care provider. Make sure you discuss any questions you have with your health care provider. °  °Document Released: 06/17/2005 Document Revised: 07/08/2014 Document Reviewed: 04/09/2013 °Elsevier Interactive Patient Education ©2016 [employer] ° °

## 2015-10-10 ENCOUNTER — Ambulatory Visit (HOSPITAL_BASED_OUTPATIENT_CLINIC_OR_DEPARTMENT_OTHER): Payer: Medicare Other | Attending: Pulmonary Disease | Admitting: Radiology

## 2015-10-10 DIAGNOSIS — G4733 Obstructive sleep apnea (adult) (pediatric): Secondary | ICD-10-CM | POA: Insufficient documentation

## 2015-10-10 DIAGNOSIS — G4730 Sleep apnea, unspecified: Secondary | ICD-10-CM | POA: Diagnosis present

## 2015-10-10 DIAGNOSIS — R0683 Snoring: Secondary | ICD-10-CM | POA: Insufficient documentation

## 2015-10-18 ENCOUNTER — Ambulatory Visit (HOSPITAL_BASED_OUTPATIENT_CLINIC_OR_DEPARTMENT_OTHER): Payer: Medicare Other | Admitting: Hematology & Oncology

## 2015-10-18 ENCOUNTER — Ambulatory Visit (HOSPITAL_BASED_OUTPATIENT_CLINIC_OR_DEPARTMENT_OTHER): Payer: Medicare Other

## 2015-10-18 ENCOUNTER — Encounter: Payer: Self-pay | Admitting: Hematology & Oncology

## 2015-10-18 ENCOUNTER — Encounter: Payer: Self-pay | Admitting: Family Medicine

## 2015-10-18 ENCOUNTER — Other Ambulatory Visit (HOSPITAL_BASED_OUTPATIENT_CLINIC_OR_DEPARTMENT_OTHER): Payer: Medicare Other

## 2015-10-18 VITALS — BP 137/67 | HR 72 | Temp 98.0°F | Resp 16 | Ht 72.0 in | Wt 268.0 lb

## 2015-10-18 DIAGNOSIS — D510 Vitamin B12 deficiency anemia due to intrinsic factor deficiency: Secondary | ICD-10-CM

## 2015-10-18 DIAGNOSIS — D519 Vitamin B12 deficiency anemia, unspecified: Secondary | ICD-10-CM

## 2015-10-18 DIAGNOSIS — D649 Anemia, unspecified: Secondary | ICD-10-CM | POA: Diagnosis not present

## 2015-10-18 DIAGNOSIS — D509 Iron deficiency anemia, unspecified: Secondary | ICD-10-CM

## 2015-10-18 DIAGNOSIS — K909 Intestinal malabsorption, unspecified: Secondary | ICD-10-CM | POA: Diagnosis not present

## 2015-10-18 DIAGNOSIS — K922 Gastrointestinal hemorrhage, unspecified: Secondary | ICD-10-CM | POA: Diagnosis not present

## 2015-10-18 DIAGNOSIS — D508 Other iron deficiency anemias: Secondary | ICD-10-CM

## 2015-10-18 DIAGNOSIS — D518 Other vitamin B12 deficiency anemias: Secondary | ICD-10-CM

## 2015-10-18 LAB — CBC WITH DIFFERENTIAL (CANCER CENTER ONLY)
BASO#: 0 10*3/uL (ref 0.0–0.2)
BASO%: 0.2 % (ref 0.0–2.0)
EOS%: 1.8 % (ref 0.0–7.0)
Eosinophils Absolute: 0.2 10*3/uL (ref 0.0–0.5)
HEMATOCRIT: 43.7 % (ref 38.7–49.9)
HGB: 14.4 g/dL (ref 13.0–17.1)
LYMPH#: 1.5 10*3/uL (ref 0.9–3.3)
LYMPH%: 17.8 % (ref 14.0–48.0)
MCH: 26.5 pg — ABNORMAL LOW (ref 28.0–33.4)
MCHC: 33 g/dL (ref 32.0–35.9)
MCV: 80 fL — ABNORMAL LOW (ref 82–98)
MONO#: 0.7 10*3/uL (ref 0.1–0.9)
MONO%: 8.2 % (ref 0.0–13.0)
NEUT#: 6.1 10*3/uL (ref 1.5–6.5)
NEUT%: 72 % (ref 40.0–80.0)
PLATELETS: 277 10*3/uL (ref 145–400)
RBC: 5.44 10*6/uL (ref 4.20–5.70)
RDW: 21.3 % — AB (ref 11.1–15.7)
WBC: 8.4 10*3/uL (ref 4.0–10.0)

## 2015-10-18 LAB — CHCC SATELLITE - SMEAR

## 2015-10-18 MED ORDER — CYANOCOBALAMIN 1000 MCG/ML IJ SOLN
1000.0000 ug | Freq: Once | INTRAMUSCULAR | Status: AC
  Administered 2015-10-18: 1000 ug via INTRAMUSCULAR

## 2015-10-18 MED ORDER — CYANOCOBALAMIN 1000 MCG/ML IJ SOLN
INTRAMUSCULAR | Status: AC
  Filled 2015-10-18: qty 1

## 2015-10-18 NOTE — Patient Instructions (Signed)

## 2015-10-18 NOTE — Progress Notes (Signed)
Hematology and Oncology Follow Up Visit  Richard [name] [id number] [birth year] 75 y.o. 10/18/2015   Principle Diagnosis:   Iron deficiency anemia secondary to malabsorption and GI blood loss.  Pernicious anemia secondary to malabsorption.  Current Therapy:    IV iron as indicated  Vitamin B-12 1 mg subcutaneous every month.     Interim History:  Richard [name] is back for follow-up. He is doing much better. We first saw him, he was clearly iron deficient and B-12 deficient.  His iron studies back in February showed a ferritin of only 20 and an iron saturation of 9%. His vitamin B-12 was 157.  He's received 2 doses of IV iron. He's been on a protocol for bottom B-12 replacement. He is feeling much better. He has much more stamina. He hasn't played 18 holes of golf this morning. He is doing this daily now.  He's not noted any melena or bright red blood per rectum. He's had no cough. He's had no rashes. He's had no nausea or vomiting.  He still has some tingling in his fingers.  He's had no fever. He has had no headache.  Overall, his performance status is ECOG 1.  Medications:  Current outpatient prescriptions:  .  allopurinol (ZYLOPRIM) 300 MG tablet, Take 450 mg by mouth daily. , Disp: , Rfl:  .  amLODipine (NORVASC) 5 MG tablet, Take 2 tablets (10 mg total) by mouth daily., Disp: 60 tablet, Rfl: 5 .  Ascorbic Acid (VITAMIN C) 500 MG tablet, Take 500 mg by mouth every morning. , Disp: , Rfl:  .  aspirin 81 MG tablet, Take 81 mg by mouth daily., Disp: , Rfl:  .  atorvastatin (LIPITOR) 80 MG tablet, Take 1 tablet (80 mg total) by mouth at bedtime., Disp: 90 tablet, Rfl: 1 .  esomeprazole (NEXIUM) 40 MG capsule, Take 1 capsule (40 mg total) by mouth daily., Disp: 30 capsule, Rfl: 5 .  fenofibrate 160 MG tablet, TAKE 1 TABLET BY MOUTH EVERY DAY, Disp: 30 tablet, Rfl: 5 .  fexofenadine (ALLEGRA ALLERGY) 180 MG tablet, Take 1 tablet (180 mg total) by mouth daily., Disp: , Rfl:  .   fluticasone (FLONASE) 50 MCG/ACT nasal spray, INHALE ONE SPRAY INTO EACH NOSTRIL TWICE DAILY., Disp: 16 g, Rfl: 5 .  FREESTYLE LITE test strip, TEST ONE TIME DAILY, Disp: 100 each, Rfl: 3 .  furosemide (LASIX) 40 MG tablet, Take 2 tablets (80 mg total) by mouth 2 (two) times daily., Disp: 180 tablet, Rfl: 0 .  glimepiride (AMARYL) 2 MG tablet, Take 1 tablet (2 mg total) by mouth daily with breakfast., Disp: 90 tablet, Rfl: 1 .  Lancets (FREESTYLE) lancets, TEST ONE TIME DAILY, Disp: 100 each, Rfl: 3 .  metFORMIN (GLUCOPHAGE) 1000 MG tablet, TAKE ONE AND ONE-HALF TABLETS BY MOUTH EVERY MORNING AND 1 TALBET IN THE EVENING., Disp: 225 tablet, Rfl: 1 .  metoprolol tartrate (LOPRESSOR) 25 MG tablet, Take 0.5 tablets (12.5 mg total) by mouth 2 (two) times daily., Disp: 30 tablet, Rfl: 11 .  montelukast (SINGULAIR) 10 MG tablet, Take 1 tablet (10 mg total) by mouth at bedtime., Disp: 90 tablet, Rfl: 1 .  nitroGLYCERIN (NITROSTAT) 0.4 MG SL tablet, Place 0.4 mg under the tongue every 5 (five) minutes as needed for chest pain., Disp: , Rfl:  .  potassium chloride SA (K-DUR,KLOR-CON) 20 MEQ tablet, TAKE ONE TABLET BY MOUTH TWICE DAILY, Disp: 60 tablet, Rfl: 5 .  pregabalin (LYRICA) 75 MG capsule, Take 75 mg by  mouth 2 (two) times daily., Disp: , Rfl:  .  psyllium (HYDROCIL/METAMUCIL) 95 % PACK, Take 1 packet by mouth 2 (two) times daily., Disp: , Rfl:  .  ZETIA 10 MG tablet, TAKE ONE TABLET BY MOUTH DAILY, Disp: 30 tablet, Rfl: 3  Allergies:  Allergies  Allergen Reactions  . Benazepril     angioedema; he is not a candidate for any angiotensin receptor blockers because of this significant allergic reaction. Because of a history of documented adverse serious drug reaction;Medi Alert bracelet  is recommended  . Hctz [Hydrochlorothiazide] Swelling    Tongue and lip swelling   . Aspirin Other (See Comments)    Gastritis, cant take 325 Mg aspirin     Past Medical History, Surgical history, Social history,  and Family History were reviewed and updated.  Review of Systems: As above  Physical Exam:  height is 6' (1.829 m) and weight is 268 lb (121.564 kg). His oral temperature is 98 F (36.7 C). His blood pressure is 137/67 and his pulse is 72. His respiration is 16.   Wt Readings from Last 3 Encounters:  10/18/15 268 lb (121.564 kg)  10/10/15 275 lb (124.739 kg)  10/09/15 272 lb 6.4 oz (123.56 kg)     Head and neck exam shows no ocular or oral lesions. His conjunctiva are pink now. He has no palpable cervical or supraclavicular lymph nodes. Lungs are clear. Cardiac exam regular rate and rhythm with no murmurs, rubs or bruits. Abdomen is soft. He is obese. Has good bowel sounds. There is no fluid wave. There is no palpable liver or spleen tip. Back exam shows a laminectomy scar in the lumbar spine. No tenderness is noted over the spine, ribs or hips. Extremity shows no clubbing, cyanosis or edema. He may have some chronic nonpitting edema of his lower legs. Skin exam shows no rashes, ecchymoses or petechia. Neurological exam shows no focal neurological deficits.   Lab Results  Component Value Date   WBC 8.4 10/18/2015   HGB 14.4 10/18/2015   HCT 43.7 10/18/2015   MCV 80* 10/18/2015   PLT 277 10/18/2015     Chemistry      Component Value Date/Time   NA 139 10/09/2015 1006   NA 138 08/23/2015 1448   K 3.8 10/09/2015 1006   K 4.0 08/23/2015 1448   CL 101 10/09/2015 1006   CL 103 08/23/2015 1448   CO2 26 10/09/2015 1006   CO2 25 08/23/2015 1448   BUN 23 10/09/2015 1006   BUN 20 08/23/2015 1448   CREATININE 1.13 10/09/2015 1006   CREATININE 1.21 08/23/2015 1448   CREATININE 1.02 05/22/2011 1621      Component Value Date/Time   CALCIUM 10.1 10/09/2015 1006   CALCIUM 9.9 08/23/2015 1448   ALKPHOS 73 09/18/2015 0907   ALKPHOS 82 08/23/2015 1448   AST 22 09/18/2015 0907   AST 24 08/23/2015 1448   ALT 22 09/18/2015 0907   ALT 23 08/23/2015 1448   BILITOT 0.5 09/18/2015 0907    BILITOT 0.4 08/23/2015 1448         Impression and Plan: Richard [name] is  a 75 year old gentleman. He has iron deficiency anemia. This is from malabsorption. I does don't think he is able to absorb because of his medications. He also has some chronic low-grade GI bleeding. He's been evaluated for this.  We are checking his antibodies for pernicious anemia. It will be interesting to see what happens.  We will give him B-12 today.  I don't think he needs any iron.  He'll come back monthly for B-12 injections.  I will plan to see him back in 3 months. I'm sure that he will need iron again at some point.   [name], MD 4/19/20175:25 PM

## 2015-10-19 ENCOUNTER — Encounter: Payer: Self-pay | Admitting: Nurse Practitioner

## 2015-10-19 ENCOUNTER — Telehealth: Payer: Self-pay | Admitting: Pulmonary Disease

## 2015-10-19 DIAGNOSIS — G4733 Obstructive sleep apnea (adult) (pediatric): Secondary | ICD-10-CM

## 2015-10-19 LAB — IRON AND TIBC
%SAT: 15 % — ABNORMAL LOW (ref 20–55)
IRON: 53 ug/dL (ref 42–163)
TIBC: 348 ug/dL (ref 202–409)
UIBC: 294 ug/dL (ref 117–376)

## 2015-10-19 LAB — ANTI-PARIETAL ANTIBODY: PARIETAL CELL AB: 2.5 U (ref 0.0–20.0)

## 2015-10-19 LAB — FERRITIN: Ferritin: 155 ng/ml (ref 22–316)

## 2015-10-19 LAB — INTRINSIC FACTOR ANTIBODIES: INTRINSIC FACTOR ABS, SERUM: 0.9 [AU]/ml (ref 0.0–1.1)

## 2015-10-19 LAB — RETICULOCYTES: Reticulocyte Count: 0.7 % (ref 0.6–2.6)

## 2015-10-19 LAB — VITAMIN B12: VITAMIN B 12: 304 pg/mL (ref 211–946)

## 2015-10-19 NOTE — Telephone Encounter (Signed)
Send Rx for PAP therapy on 15 cm H2O with a Large size Fisher&Paykel Nasal Mask Eson mask and heated humidification. donwload in 4 wks  OV with TP in 6-8 wks

## 2015-10-19 NOTE — Progress Notes (Signed)
Patient Name: Richard [name], [name] Date: 10/10/2015 Gender: Male D.O.B: Dec 02, 1940 Age (years): 7 Referring Provider: Kara Mead MD, ABSM Height (inches): 72 Interpreting Physician: Kara Mead MD, ABSM Weight (lbs): 275 RPSGT: Carolin Coy BMI: 37 MRN: [id number] Neck Size: 19.00   CLINICAL INFORMATION The patient is referred for a CPAP titration to treat sleep apnea.   SLEEP STUDY TECHNIQUE As per the AASM Manual for the Scoring of Sleep and Associated Events v2.3 (April 2016) with a hypopnea requiring 4% desaturations. The channels recorded and monitored were frontal, central and occipital EEG, electrooculogram (EOG), submentalis EMG (chin), nasal and oral airflow, thoracic and abdominal wall motion, anterior tibialis EMG, snore microphone, electrocardiogram, and pulse oximetry. Continuous positive airway pressure (CPAP) was initiated at the beginning of the study and titrated to treat sleep-disordered breathing.   RESPIRATORY PARAMETERS Optimal PAP Pressure (cm): 15 AHI at Optimal Pressure (/hr): 1.0 Overall Minimal O2 (%): 85.00 Supine % at Optimal Pressure (%): 100 Minimal O2 at Optimal Pressure (%): 88.0     SLEEP ARCHITECTURE The study was initiated at 10:25:54 PM and ended at 4:55:56 AM. Sleep onset time was 19.7 minutes and the sleep efficiency was 78.5%. The total sleep time was 306.0 minutes. The patient spent 18.46% of the night in stage N1 sleep, 64.87% in stage N2 sleep, 0.00% in stage N3 and 16.67% in REM.Stage REM latency was 104.5 minutes Wake after sleep onset was 64.3. Alpha intrusion was absent. Supine sleep was 100.00%.   CARDIAC DATA The 2 lead EKG demonstrated sinus rhythm. The mean heart rate was 43.76 beats per minute. Other EKG findings include: None.   LEG MOVEMENT DATA The total Periodic Limb Movements of Sleep (PLMS) were 38. The PLMS index was 7.45. A PLMS index of <15 is considered normal in adults.   IMPRESSIONS - The optimal PAP  pressure was 15 cm of water. - Central sleep apnea was not noted during this titration (CAI = 0.2/h). - Moderate oxygen desaturations were observed during this titration (min O2 = 85.00%). - The patient snored with Soft snoring volume during this titration study. - No cardiac abnormalities were observed during this study. - Mild periodic limb movements were observed during this study. Arousals associated with PLMs were rare.   DIAGNOSIS - Obstructive Sleep Apnea (327.23 [G47.33 ICD-10])   RECOMMENDATIONS - Trial of CPAP therapy on 15 cm H2O with a Large size Fisher&Paykel Nasal Mask Eson mask and heated humidification. - Avoid alcohol, sedatives and other CNS depressants that may worsen sleep apnea and disrupt normal sleep architecture. - Sleep hygiene should be reviewed to assess factors that may improve sleep quality. - Weight management and regular exercise should be initiated or continued. - Return to [facility] for re-evaluation after 4 weeks of therapy  Kara Mead MD. FCCP. [facility]   10/19/2015

## 2015-10-24 ENCOUNTER — Other Ambulatory Visit: Payer: Self-pay | Admitting: Cardiology

## 2015-10-24 NOTE — Telephone Encounter (Signed)
Please check with pt and see how much he is taking and then refill Richard [name]

## 2015-10-24 NOTE — Telephone Encounter (Signed)
Rx has been sent to the pharmacy electronically. ° °

## 2015-10-25 NOTE — Telephone Encounter (Signed)
CPAP ordered. Patient aware. Scheduled for follow up appt with TP on 12/21/15 at 4:30pm. Nothing further needed.

## 2015-10-26 MED ORDER — FUROSEMIDE 40 MG PO TABS: 80.0000 mg | ORAL_TABLET | Freq: Two times a day (BID) | ORAL | Status: DC

## 2015-10-26 NOTE — Telephone Encounter (Signed)
Spoke with pt, his furosemide was recently increased by his medical doctor to 80 mg twice daily. He has a f/u appt with dr hochrein in June. Refill sent to the pharmacy electronically.

## 2015-10-26 NOTE — Addendum Note (Signed)
Addended by: Cristopher Estimable on: 10/26/2015 10:41 AM   Modules accepted: Orders

## 2015-10-27 ENCOUNTER — Other Ambulatory Visit: Payer: Self-pay | Admitting: Cardiology

## 2015-10-27 NOTE — Telephone Encounter (Signed)
Rx(s) sent to pharmacy electronically.  

## 2015-11-10 ENCOUNTER — Other Ambulatory Visit: Payer: Self-pay

## 2015-11-10 ENCOUNTER — Other Ambulatory Visit: Payer: Self-pay | Admitting: Cardiology

## 2015-11-15 ENCOUNTER — Other Ambulatory Visit (HOSPITAL_BASED_OUTPATIENT_CLINIC_OR_DEPARTMENT_OTHER): Payer: Medicare Other

## 2015-11-15 ENCOUNTER — Other Ambulatory Visit: Payer: Self-pay | Admitting: Family

## 2015-11-15 ENCOUNTER — Ambulatory Visit (HOSPITAL_BASED_OUTPATIENT_CLINIC_OR_DEPARTMENT_OTHER): Payer: Medicare Other

## 2015-11-15 VITALS — BP 131/57 | HR 92 | Temp 98.2°F | Resp 20

## 2015-11-15 DIAGNOSIS — D510 Vitamin B12 deficiency anemia due to intrinsic factor deficiency: Secondary | ICD-10-CM

## 2015-11-15 DIAGNOSIS — D519 Vitamin B12 deficiency anemia, unspecified: Secondary | ICD-10-CM

## 2015-11-15 DIAGNOSIS — D509 Iron deficiency anemia, unspecified: Secondary | ICD-10-CM | POA: Diagnosis not present

## 2015-11-15 LAB — CBC WITH DIFFERENTIAL (CANCER CENTER ONLY)
BASO#: 0 10*3/uL (ref 0.0–0.2)
BASO%: 0.2 % (ref 0.0–2.0)
EOS%: 1.5 % (ref 0.0–7.0)
Eosinophils Absolute: 0.1 10*3/uL (ref 0.0–0.5)
HCT: 40.8 % (ref 38.7–49.9)
HGB: 13.4 g/dL (ref 13.0–17.1)
LYMPH#: 1.6 10*3/uL (ref 0.9–3.3)
LYMPH%: 18.2 % (ref 14.0–48.0)
MCH: 27.2 pg — ABNORMAL LOW (ref 28.0–33.4)
MCHC: 32.8 g/dL (ref 32.0–35.9)
MCV: 83 fL (ref 82–98)
MONO#: 0.8 10*3/uL (ref 0.1–0.9)
MONO%: 8.7 % (ref 0.0–13.0)
NEUT#: 6.4 10*3/uL (ref 1.5–6.5)
NEUT%: 71.4 % (ref 40.0–80.0)
Platelets: 261 10*3/uL (ref 145–400)
RBC: 4.92 10*6/uL (ref 4.20–5.70)
RDW: 19.7 % — ABNORMAL HIGH (ref 11.1–15.7)
WBC: 9 10*3/uL (ref 4.0–10.0)

## 2015-11-15 MED ORDER — CYANOCOBALAMIN 1000 MCG/ML IJ SOLN
INTRAMUSCULAR | Status: AC
  Filled 2015-11-15: qty 1

## 2015-11-15 MED ORDER — CYANOCOBALAMIN 1000 MCG/ML IJ SOLN
1000.0000 ug | Freq: Once | INTRAMUSCULAR | Status: AC
  Administered 2015-11-15: 1000 ug via INTRAMUSCULAR

## 2015-11-15 NOTE — Patient Instructions (Signed)

## 2015-11-16 LAB — FERRITIN: FERRITIN: 141 ng/mL (ref 22–316)

## 2015-11-16 LAB — IRON AND TIBC
%SAT: 19 % — AB (ref 20–55)
IRON: 54 ug/dL (ref 42–163)
TIBC: 290 ug/dL (ref 202–409)
UIBC: 236 ug/dL (ref 117–376)

## 2015-11-16 LAB — VITAMIN B12: Vitamin B12: 277 pg/mL (ref 211–946)

## 2015-11-17 ENCOUNTER — Encounter: Payer: Self-pay | Admitting: *Deleted

## 2015-11-18 ENCOUNTER — Other Ambulatory Visit: Payer: Self-pay | Admitting: Cardiology

## 2015-11-20 NOTE — Telephone Encounter (Signed)
Rx(s) sent to pharmacy electronically.  

## 2015-11-25 ENCOUNTER — Other Ambulatory Visit: Payer: Self-pay | Admitting: Family Medicine

## 2015-11-29 NOTE — Progress Notes (Signed)
HPI The patient presents for follow up of coronary artery disease. He has had bypass several years ago. He was in the hospital September 2015 with some dizziness. The patient underwent cardiac catheterization on 03/16/2014 which showed 100% mid LAD chronic occlusion, with mid to distal vessel filled with patent LIMA graft, diffuse 90% stenosis in OM1, complete occlusion in OM 2, patent sequential SVG to OM1/OM 2, and 99% proximal RCA stenosis with 100% mid RCA occlusion, patent LIMA to RCA, patient does have occluded SVG to diagonal branch. With 4 out 5 of previous bypass grafts were still patent medical management was recommended.  He did have bradycardia and was taken off his beta blocker.   Since I last saw him he's had no new problems. He does walk playing golf daily.  He doesn't exercise as much as I would like because of joint problems.  The patient denies any new symptoms such as chest discomfort, neck or arm discomfort. There has been no new shortness of breath, PND or orthopnea. There have been no reported palpitations, presyncope or syncope.    Allergies  Allergen Reactions  . Benazepril     angioedema; he is not a candidate for any angiotensin receptor blockers because of this significant allergic reaction. Because of a history of documented adverse serious drug reaction;Medi Alert bracelet  is recommended  . Hctz [Hydrochlorothiazide] Swelling    Tongue and lip swelling   . Aspirin Other (See Comments)    Gastritis, cant take 325 Mg aspirin     Current Outpatient Prescriptions  Medication Sig Dispense Refill  . allopurinol (ZYLOPRIM) 300 MG tablet Take 450 mg by mouth daily.     [name] amLODipine (NORVASC) 5 MG tablet TAKE 2 TABLETS (10 MG TOTAL) BY MOUTH DAILY. 60 tablet 1  . Ascorbic Acid (VITAMIN C) 500 MG tablet Take 500 mg by mouth every morning.     [name] aspirin 81 MG tablet Take 81 mg by mouth daily.    [name] atorvastatin (LIPITOR) 80 MG tablet TAKE ONE TABLET BY MOUTH AT BEDTIME  90 tablet 0  . esomeprazole (NEXIUM) 40 MG capsule Take 1 capsule (40 mg total) by mouth daily. 30 capsule 5  . fenofibrate 160 MG tablet TAKE 1 TABLET BY MOUTH EVERY DAY 30 tablet 5  . fluticasone (FLONASE) 50 MCG/ACT nasal spray INHALE ONE SPRAY INTO EACH NOSTRIL TWICE DAILY. 16 g 5  . FREESTYLE LITE test strip TEST ONE TIME DAILY 100 each 3  . furosemide (LASIX) 40 MG tablet Take 2 tablets (80 mg total) by mouth 2 (two) times daily. 360 tablet 3  . glimepiride (AMARYL) 2 MG tablet Take 1 tablet (2 mg total) by mouth daily with breakfast. 90 tablet 1  . Lancets (FREESTYLE) lancets TEST ONE TIME DAILY 100 each 3  . metFORMIN (GLUCOPHAGE) 1000 MG tablet TAKE ONE AND ONE-HALF TABLETS BY MOUTH EVERY MORNING AND 1 TALBET IN THE EVENING. 225 tablet 1  . metoprolol tartrate (LOPRESSOR) 25 MG tablet TAKE HALF TABLET BY MOUTH TWICE DAILY 30 tablet 9  . montelukast (SINGULAIR) 10 MG tablet Take 1 tablet (10 mg total) by mouth at bedtime. 90 tablet 1  . nitroGLYCERIN (NITROSTAT) 0.4 MG SL tablet Place 0.4 mg under the tongue every 5 (five) minutes as needed for chest pain.    . potassium chloride SA (K-DUR,KLOR-CON) 20 MEQ tablet TAKE ONE TABLET BY MOUTH TWICE DAILY 60 tablet 1  . pregabalin (LYRICA) 75 MG capsule Take 75 mg by mouth 2 (  two) times daily.    . psyllium (HYDROCIL/METAMUCIL) 95 % PACK Take 1 packet by mouth 2 (two) times daily.    [name] ZETIA 10 MG tablet TAKE ONE TABLET BY MOUTH DAILY 30 tablet 8   No current facility-administered medications for this visit.    Past Medical History  Diagnosis Date  . Gout   . Esophageal stricture     X 4  . Mixed hyperlipidemia   . Peptic stricture of esophagus   . Antritis (stomach)     mild  . Duodenitis     chronic  . Diverticulosis     mild, left colon  . Anemia   . HTN (hypertension)   . Sliding hiatal hernia   . GERD (gastroesophageal reflux disease)   . CAD (coronary artery disease)   . HNP (herniated nucleus pulposus), lumbar   .  Adenomatous colon polyp 05/01/2015    tubular  . Vitamin B12 deficiency   . Spinal stenosis   . Skin cancer     "right neck cut off; several frozen off my arms" (03/15/2014)  . OSA on CPAP     with 2L O2 at night  . Type II diabetes mellitus ([place])   . Migraines     "til age 7 yrs"  . Arthritis     "knees, hips, shoulders" (03/15/2014)  . History of gout   . Chronic lower back pain   . Renal insufficiency   . Wenckebach block     a. brady down to 30s due to frequent block, metoprolol held in response.    Past Surgical History  Procedure Laterality Date  . Vasectomy    . Coronary artery bypass graft  03/2000    "CABG X5"  . Esophageal dilation  X 3-4    Dr. Lyla Son; "last one was in the 1990's"  . Esophagogastroduodenoscopy      multiple  . Flexible sigmoidoscopy      multiple  . Panendoscopy    . Knee arthroscopy Left 2011    meniscus repair  . Shoulder surgery Right 08/2010    screws placed; "tendons tore off"  . Colonoscopy w/ biopsies and polypectomy  2013  . Upper gi endoscopy  2013    Gastritis; Dr Carlean Purl  . Myelogram  04/06/13    lumbar, Dr Gladstone Lighter  . Lumbar laminectomy/decompression microdiscectomy Right 06/17/2013    Procedure: LUMBAR LAMINECTOMY MICRODISCECTOMY L4-L5 RIGHT EXCISION OF SYNOVIAL CYST RIGHT   (1 LEVEL) RIGHT PARTIAL FACETECTOMY;  Surgeon: Tobi Bastos, MD;  Location: [facility];  Service: Orthopedics;  Laterality: Right;  . Back surgery    . Appendectomy  ~ 1952  . Tonsillectomy  ~ 1954  . Coronary angioplasty  1993  . Coronary angioplasty with stent placement  05/1997    "1"  . Skin cancer excision Right     "neck"  . Left heart catheterization with coronary/graft angiogram N/A 03/16/2014    Procedure: LEFT HEART CATHETERIZATION WITH Beatrix Fetters;  Surgeon: Burnell Blanks, MD;  Location: [facility] CATH LAB;  Service: Cardiovascular;  Laterality: N/A;    ROS:  As stated in the HPI and negative for all other systems.  PHYSICAL  EXAM BP 126/58 mmHg  Pulse 62  Ht 6' (1.829 m)  Wt 268 lb 6.4 oz (121.745 kg)  BMI 36.39 kg/m2 GENERAL:  Well appearing NECK:  No jugular venous distention, waveform within normal limits, carotid upstroke brisk and symmetric, bilateral bruits versus transmitted systolic murmur bruits, no thyromegaly LYMPHATICS:  No cervical, inguinal adenopathy LUNGS:  Clear to auscultation bilaterally BACK:  No CVA tenderness CHEST:  Well healed sternotomy scar. HEART:  PMI not displaced or sustained,S1 and S2 within normal limits, no S3, no S4, no clicks, no rubs, apical systolic murmur radiating out the aortic outflow tract and no increase with the strain phase of Valsalva, no diastolic murmurs ABD:  Flat, positive bowel sounds normal in frequency in pitch, no bruits, no rebound, no guarding, no midline pulsatile mass, no hepatomegaly, no splenomegaly, obese EXT:  2 plus pulses throughout, trace bilateral lower extremityedema, no cyanosis no clubbing  EKG:  Sinus rhythm, Mobitz type I heart block, poor anterior R wave progression. 11/30/2015  ASSESSMENT AND PLAN  CAD:  The patient has no new sypmtoms.  No further cardiovascular testing is indicated.  We will continue with aggressive risk reduction and meds as listed.  MURMUR:    He has tricuspid regurgitation noted on echo. No change in therapy or further imaging is indicated.   CAROTID STENOSIS:   The patient has moderate stenosis and will have follow-up Dopplers in November.  DYSLIPIDEMIA:   He will keep the meds as listed.  His lipid profile is excellent.  He will continue on the meds as listed.   DM:  This is being followed by [name], DO .   The A1c was 7.21.  OBESITY:   He has lost weight and I congratulated him and ask for more of the same.

## 2015-11-30 ENCOUNTER — Encounter: Payer: Self-pay | Admitting: Cardiology

## 2015-11-30 ENCOUNTER — Ambulatory Visit (INDEPENDENT_AMBULATORY_CARE_PROVIDER_SITE_OTHER): Payer: Medicare Other | Admitting: Cardiology

## 2015-11-30 VITALS — BP 126/58 | HR 62 | Ht 72.0 in | Wt 268.4 lb

## 2015-11-30 DIAGNOSIS — I25810 Atherosclerosis of coronary artery bypass graft(s) without angina pectoris: Secondary | ICD-10-CM | POA: Diagnosis not present

## 2015-11-30 NOTE — Patient Instructions (Signed)
Your physician wants you to follow-up in: 1 Year. You will receive a reminder letter in the mail two months in advance. If you don't receive a letter, please call our office to schedule the follow-up appointment.  

## 2015-12-05 ENCOUNTER — Other Ambulatory Visit: Payer: Self-pay | Admitting: Family Medicine

## 2015-12-05 NOTE — Telephone Encounter (Signed)
Medication filled to pharmacy as requested.   

## 2015-12-12 ENCOUNTER — Other Ambulatory Visit: Payer: Self-pay | Admitting: *Deleted

## 2015-12-12 DIAGNOSIS — D519 Vitamin B12 deficiency anemia, unspecified: Secondary | ICD-10-CM

## 2015-12-13 ENCOUNTER — Other Ambulatory Visit (HOSPITAL_BASED_OUTPATIENT_CLINIC_OR_DEPARTMENT_OTHER): Payer: Medicare Other

## 2015-12-13 ENCOUNTER — Ambulatory Visit (HOSPITAL_BASED_OUTPATIENT_CLINIC_OR_DEPARTMENT_OTHER): Payer: Medicare Other

## 2015-12-13 VITALS — BP 117/45 | HR 82 | Temp 98.6°F | Resp 16

## 2015-12-13 DIAGNOSIS — D519 Vitamin B12 deficiency anemia, unspecified: Secondary | ICD-10-CM

## 2015-12-13 DIAGNOSIS — D510 Vitamin B12 deficiency anemia due to intrinsic factor deficiency: Secondary | ICD-10-CM

## 2015-12-13 DIAGNOSIS — D509 Iron deficiency anemia, unspecified: Secondary | ICD-10-CM

## 2015-12-13 LAB — CBC WITH DIFFERENTIAL (CANCER CENTER ONLY)
BASO#: 0 10*3/uL (ref 0.0–0.2)
BASO%: 0.2 % (ref 0.0–2.0)
EOS%: 1.7 % (ref 0.0–7.0)
Eosinophils Absolute: 0.2 10*3/uL (ref 0.0–0.5)
HEMATOCRIT: 42.2 % (ref 38.7–49.9)
HGB: 13.8 g/dL (ref 13.0–17.1)
LYMPH#: 1.6 10*3/uL (ref 0.9–3.3)
LYMPH%: 17.4 % (ref 14.0–48.0)
MCH: 27.7 pg — ABNORMAL LOW (ref 28.0–33.4)
MCHC: 32.7 g/dL (ref 32.0–35.9)
MCV: 85 fL (ref 82–98)
MONO#: 0.7 10*3/uL (ref 0.1–0.9)
MONO%: 8 % (ref 0.0–13.0)
NEUT#: 6.7 10*3/uL — ABNORMAL HIGH (ref 1.5–6.5)
NEUT%: 72.7 % (ref 40.0–80.0)
PLATELETS: 259 10*3/uL (ref 145–400)
RBC: 4.99 10*6/uL (ref 4.20–5.70)
RDW: 17.6 % — AB (ref 11.1–15.7)
WBC: 9.2 10*3/uL (ref 4.0–10.0)

## 2015-12-13 MED ORDER — CYANOCOBALAMIN 1000 MCG/ML IJ SOLN
1000.0000 ug | Freq: Once | INTRAMUSCULAR | Status: AC
  Administered 2015-12-13: 1000 ug via INTRAMUSCULAR

## 2015-12-13 MED ORDER — CYANOCOBALAMIN 1000 MCG/ML IJ SOLN
INTRAMUSCULAR | Status: AC
  Filled 2015-12-13: qty 1

## 2015-12-13 NOTE — Patient Instructions (Signed)

## 2015-12-15 ENCOUNTER — Other Ambulatory Visit: Payer: Self-pay | Admitting: Cardiology

## 2015-12-15 NOTE — Telephone Encounter (Signed)
Rx(s) sent to pharmacy electronically.  

## 2015-12-21 ENCOUNTER — Ambulatory Visit (INDEPENDENT_AMBULATORY_CARE_PROVIDER_SITE_OTHER): Payer: Medicare Other | Admitting: Adult Health

## 2015-12-21 ENCOUNTER — Encounter: Payer: Self-pay | Admitting: Adult Health

## 2015-12-21 VITALS — BP 150/70 | HR 81 | Temp 98.0°F | Ht 72.0 in | Wt 266.0 lb

## 2015-12-21 DIAGNOSIS — G4733 Obstructive sleep apnea (adult) (pediatric): Secondary | ICD-10-CM | POA: Diagnosis not present

## 2015-12-21 DIAGNOSIS — I25810 Atherosclerosis of coronary artery bypass graft(s) without angina pectoris: Secondary | ICD-10-CM | POA: Diagnosis not present

## 2015-12-21 NOTE — Assessment & Plan Note (Signed)
Well controlled on CPAP At bedtime    Plan  Cont on CPAP At bedtime   Work on weight loss.

## 2015-12-21 NOTE — Patient Instructions (Signed)
Continue on CPAP At bedtime   Work on weight loss.  Do not drive if sleepy  follow up Dr. Elsworth Soho  In 1 year and As needed

## 2015-12-21 NOTE — Progress Notes (Signed)
Subjective:    Patient ID: Richard [name], male    DOB: 12-Feb-1941, 75 y.o.   MRN: [id number]  HPI 75 yo male with OSA dx in 2008   12/21/2015 Follow up : OSA  Pt returns for 4 month follow up  Uses  CPAP on average 7-8 hours nightly, mask fits well.  Got new machine recently . Doing very well.  Can't live without it.  Download shows excellent compliance. AHI 1.7 .  +leaks but does not interfere with sleep .  Plays golf daily . He says he is very good. Score 71 .   Worked for [employer] . Avid athlete-col[employer].  . Previous Leisure centre manager .    Past Medical History  Diagnosis Date  . Gout   . Esophageal stricture     X 4  . Mixed hyperlipidemia   . Peptic stricture of esophagus   . Antritis (stomach)     mild  . Duodenitis     chronic  . Diverticulosis     mild, left colon  . Anemia   . HTN (hypertension)   . Sliding hiatal hernia   . GERD (gastroesophageal reflux disease)   . CAD (coronary artery disease)   . HNP (herniated nucleus pulposus), lumbar   . Adenomatous colon polyp 05/01/2015    tubular  . Vitamin B12 deficiency   . Spinal stenosis   . Skin cancer     "right neck cut off; several frozen off my arms" (03/15/2014)  . OSA on CPAP     with 2L O2 at night  . Type II diabetes mellitus ([place])   . Migraines     "til age 76 yrs"  . Arthritis     "knees, hips, shoulders" (03/15/2014)  . History of gout   . Chronic lower back pain   . Renal insufficiency   . Wenckebach block     a. brady down to 30s due to frequent block, metoprolol held in response.   Current Outpatient Prescriptions on File Prior to Visit  Medication Sig Dispense Refill  . allopurinol (ZYLOPRIM) 300 MG tablet Take 450 mg by mouth daily.     [name] amLODipine (NORVASC) 5 MG tablet TAKE 2 TABLETS (10 MG TOTAL) BY MOUTH DAILY. 60 tablet 11  . Ascorbic Acid (VITAMIN C) 500 MG tablet Take 500 mg by mouth every morning.     [name] aspirin 81 MG tablet Take 81 mg by mouth daily.    [name]  atorvastatin (LIPITOR) 80 MG tablet TAKE ONE TABLET BY MOUTH AT BEDTIME 90 tablet 0  . esomeprazole (NEXIUM) 40 MG capsule Take 1 capsule (40 mg total) by mouth daily. 30 capsule 5  . fenofibrate 160 MG tablet TAKE 1 TABLET BY MOUTH EVERY DAY 30 tablet 5  . fluticasone (FLONASE) 50 MCG/ACT nasal spray INHALE ONE SPRAY INTO EACH NOSTRIL TWICE DAILY. 16 g 5  . FREESTYLE LITE test strip TEST ONE TIME DAILY 100 each 3  . furosemide (LASIX) 40 MG tablet Take 2 tablets (80 mg total) by mouth 2 (two) times daily. 360 tablet 3  . glimepiride (AMARYL) 2 MG tablet Take 1 tablet (2 mg total) by mouth daily with breakfast. 90 tablet 1  . Lancets (FREESTYLE) lancets TEST ONE TIME DAILY 100 each 3  . metFORMIN (GLUCOPHAGE) 1000 MG tablet TAKE ONE AND ONE-HALF TABLETS BY MOUTH EVERY MORNING AND 1 TALBET IN THE EVENING. 225 tablet 1  . metoprolol tartrate (LOPRESSOR) 25 MG tablet TAKE HALF TABLET  BY MOUTH TWICE DAILY 30 tablet 9  . montelukast (SINGULAIR) 10 MG tablet Take 1 tablet (10 mg total) by mouth at bedtime. 90 tablet 1  . nitroGLYCERIN (NITROSTAT) 0.4 MG SL tablet Place 0.4 mg under the tongue every 5 (five) minutes as needed for chest pain.    . potassium chloride SA (K-DUR,KLOR-CON) 20 MEQ tablet TAKE ONE TABLET BY MOUTH TWICE DAILY 60 tablet 11  . pregabalin (LYRICA) 75 MG capsule Take 75 mg by mouth 2 (two) times daily.    . psyllium (HYDROCIL/METAMUCIL) 95 % PACK Take 1 packet by mouth 2 (two) times daily.    [name] ZETIA 10 MG tablet TAKE ONE TABLET BY MOUTH DAILY 30 tablet 8   No current facility-administered medications on file prior to visit.      Review of Systems Constitutional:   No  weight loss, night sweats,  Fevers, chills,  +fatigue, or  lassitude.  HEENT:   No headaches,  Difficulty swallowing,  Tooth/dental problems, or  Sore throat,                No sneezing, itching, ear ache, nasal congestion, post nasal drip,   CV:  No chest pain,  Orthopnea, PND, swelling in lower extremities,  anasarca, dizziness, palpitations, syncope.   GI  No heartburn, indigestion, abdominal pain, nausea, vomiting, diarrhea, change in bowel habits, loss of appetite, bloody stools.   Resp: No shortness of breath with exertion or at rest.  No excess mucus, no productive cough,  No non-productive cough,  No coughing up of blood.  No change in color of mucus.  No wheezing.  No chest wall deformity  Skin: no rash or lesions.  GU: no dysuria, change in color of urine, no urgency or frequency.  No flank pain, no hematuria   MS:  No joint pain or swelling.  No decreased range of motion.  No back pain.  Psych:  No change in mood or affect. No depression or anxiety.  No memory loss.         Objective:   Physical Exam  Filed Vitals:   12/21/15 1640  BP: 150/70  Pulse: 81  Temp: 98 F (36.7 C)  TempSrc: Oral  Height: 6' (1.829 m)  Weight: 266 lb (120.657 kg)  SpO2: 93%   GEN: A/Ox3; pleasant , NAD, .obese   HEENT:  /AT,  EACs-clear, TMs-wnl, NOSE-clear, THROAT-clear, no lesions, no postnasal drip or exudate noted. Class 3 MP airway   NECK:  Supple w/ fair ROM; no JVD; normal carotid impulses w/o bruits; no thyromegaly or nodules palpated; no lymphadenopathy.  RESP  Clear  P & A; w/o, wheezes/ rales/ or rhonchi.no accessory muscle use, no dullness to percussion  CARD:  RRR, no m/r/g  , no peripheral edema, pulses intact, no cyanosis or clubbing.  GI:   Soft & nt; nml bowel sounds; no organomegaly or masses detected.  Musco: Warm bil, no deformities or joint swelling noted.   Neuro: alert, no focal deficits noted.    Skin: Warm, no lesions or rashes  Tammy Parrett NP-C  [facility]  /12/21/2015      Assessment & Plan:

## 2015-12-25 NOTE — Progress Notes (Signed)
Reviewed & agree with plan  

## 2016-01-08 ENCOUNTER — Ambulatory Visit (INDEPENDENT_AMBULATORY_CARE_PROVIDER_SITE_OTHER): Payer: Medicare Other | Admitting: Medical

## 2016-01-08 ENCOUNTER — Encounter: Payer: Self-pay | Admitting: Medical

## 2016-01-08 VITALS — BP 136/86 | HR 71 | Temp 98.5°F | Ht 72.0 in | Wt 265.0 lb

## 2016-01-08 DIAGNOSIS — J209 Acute bronchitis, unspecified: Secondary | ICD-10-CM

## 2016-01-08 DIAGNOSIS — J0100 Acute maxillary sinusitis, unspecified: Secondary | ICD-10-CM

## 2016-01-08 DIAGNOSIS — E119 Type 2 diabetes mellitus without complications: Secondary | ICD-10-CM | POA: Diagnosis not present

## 2016-01-08 DIAGNOSIS — I25810 Atherosclerosis of coronary artery bypass graft(s) without angina pectoris: Secondary | ICD-10-CM | POA: Diagnosis not present

## 2016-01-08 DIAGNOSIS — J029 Acute pharyngitis, unspecified: Secondary | ICD-10-CM | POA: Diagnosis not present

## 2016-01-08 DIAGNOSIS — R05 Cough: Secondary | ICD-10-CM

## 2016-01-08 DIAGNOSIS — R059 Cough, unspecified: Secondary | ICD-10-CM

## 2016-01-08 MED ORDER — BENZONATATE 100 MG PO CAPS: 100.0000 mg | ORAL_CAPSULE | Freq: Three times a day (TID) | ORAL | Status: DC | PRN

## 2016-01-08 MED ORDER — RANITIDINE HCL 150 MG PO CAPS: 150.0000 mg | ORAL_CAPSULE | Freq: Two times a day (BID) | ORAL | Status: DC

## 2016-01-08 MED ORDER — AZITHROMYCIN 250 MG PO TABS: ORAL_TABLET | ORAL | Status: DC

## 2016-01-08 NOTE — Patient Instructions (Addendum)
You appear to have sinus infection and bronchitis. Also some history of allergies this time of the year as well.  Rx azithromycin antibiotic. Rx benzonatate for cough. Continue your flonase.  You may have recent reflux flare since you report some burning type sensation upper esophagus like. Continue nexium and add ranitidine.  Also will put in a1c and cmp order to assess diabetes.  Follow up in 7-10 days or as needed

## 2016-01-08 NOTE — Progress Notes (Signed)
Pre visit review using our clinic review tool, if applicable. No additional management support is needed unless otherwise documented below in the visit note. 

## 2016-01-08 NOTE — Progress Notes (Signed)
Subjective:    Patient ID: Richard [name], male    DOB: 06/17/1941, 75 y.o.   MRN: [id number]  HPI  Pt in with sore throat since last Thursday. And now he is coughing up some mucous. Some nasal congestion.When he blew his nose got some mucous this am. No fever, no chills or sweats. He does feel tired. Pt does have some allergies this time of the year. Soe sinus pressure  Pt also mentions some burning in upper throat. Pt is on nexium 40 mg a day. Recent a lot of belching.  Pt uses cpap for past 20 years.  Pt mentioned that has been some time since a1c checked. His sugar in am 112-120 on average.    Review of Systems  Constitutional: Positive for fatigue. Negative for fever and chills.  HENT: Positive for congestion, postnasal drip, rhinorrhea and sinus pressure. Negative for ear pain and nosebleeds.   Respiratory: Positive for cough. Negative for shortness of breath and wheezing.        Productive cough at times.  Cardiovascular: Negative for chest pain and palpitations.  Gastrointestinal: Negative for abdominal pain.  Musculoskeletal: Negative for back pain.  Neurological: Negative for dizziness, facial asymmetry, speech difficulty, weakness and numbness.  Hematological: Negative for adenopathy. Does not bruise/bleed easily.  Psychiatric/Behavioral: Negative for behavioral problems. The patient is not nervous/anxious.    Past Medical History  Diagnosis Date  . Gout   . Esophageal stricture     X 4  . Mixed hyperlipidemia   . Peptic stricture of esophagus   . Antritis (stomach)     mild  . Duodenitis     chronic  . Diverticulosis     mild, left colon  . Anemia   . HTN (hypertension)   . Sliding hiatal hernia   . GERD (gastroesophageal reflux disease)   . CAD (coronary artery disease)   . HNP (herniated nucleus pulposus), lumbar   . Adenomatous colon polyp 05/01/2015    tubular  . Vitamin B12 deficiency   . Spinal stenosis   . Skin cancer     "right neck cut off;  several frozen off my arms" (03/15/2014)  . OSA on CPAP     with 2L O2 at night  . Type II diabetes mellitus ([place])   . Migraines     "til age 86 yrs"  . Arthritis     "knees, hips, shoulders" (03/15/2014)  . History of gout   . Chronic lower back pain   . Renal insufficiency   . Wenckebach block     a. brady down to 30s due to frequent block, metoprolol held in response.     Social History   Social History  . Marital Status: Divorced    Spouse Name: N/A  . Number of Children: 2  . Years of Education: N/A   Occupational History  .     Social History Main Topics  . Smoking status: Never Smoker   . Smokeless tobacco: Never Used  . Alcohol Use: 0.0 oz/week    0 Standard drinks or equivalent per week     Comment: "last drink was in 2012"( 03/15/2014)  . Drug Use: No  . Sexual Activity: Not Currently   Other Topics Concern  . Not on file   Social History Narrative   Divorced, lives with a roommate.   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    Past Surgical History  Procedure Laterality Date  . Vasectomy    . Coronary artery bypass graft  03/2000    "CABG X5"  . Esophageal dilation  X 3-4    Dr. Lyla Son; "last one was in the 1990's"  . Esophagogastroduodenoscopy      multiple  . Flexible sigmoidoscopy      multiple  . Panendoscopy    . Knee arthroscopy Left 2011    meniscus repair  . Shoulder surgery Right 08/2010    screws placed; "tendons tore off"  . Colonoscopy w/ biopsies and polypectomy  2013  . Upper gi endoscopy  2013    Gastritis; Dr Carlean Purl  . Myelogram  04/06/13    lumbar, Dr Gladstone Lighter  . Lumbar laminectomy/decompression microdiscectomy Right 06/17/2013    Procedure: LUMBAR LAMINECTOMY MICRODISCECTOMY L4-L5 RIGHT EXCISION OF SYNOVIAL CYST RIGHT   (1 LEVEL) RIGHT PARTIAL  FACETECTOMY;  Surgeon: Tobi Bastos, MD;  Location: [facility];  Service: Orthopedics;  Laterality: Right;  . Back surgery    . Appendectomy  ~ 1952  . Tonsillectomy  ~ 1954  . Coronary angioplasty  1993  . Coronary angioplasty with stent placement  05/1997    "1"  . Skin cancer excision Right     "neck"  . Left heart catheterization with coronary/graft angiogram N/A 03/16/2014    Procedure: LEFT HEART CATHETERIZATION WITH Beatrix Fetters;  Surgeon: Burnell Blanks, MD;  Location: [facility] CATH LAB;  Service: Cardiovascular;  Laterality: N/A;    Family History  Problem Relation Age of Onset  . Stroke Father   . Hypertension Father   . Pancreatic cancer Mother   . Diabetes Maternal Grandmother   . Stroke Maternal Grandmother   . Heart attack Paternal Grandmother   . Colon cancer Neg Hx   . Esophageal cancer Neg Hx   . Rectal cancer Neg Hx   . Stomach cancer Neg Hx   . Ulcers Neg Hx     Allergies  Allergen Reactions  . Benazepril     angioedema; he is not a candidate for any angiotensin receptor blockers because of this significant allergic reaction. Because of a history of documented adverse serious drug reaction;Medi Alert bracelet  is recommended  . Hctz [Hydrochlorothiazide] Swelling    Tongue and lip swelling   . Aspirin Other (See Comments)    Gastritis, cant take 325 Mg aspirin     Current Outpatient Prescriptions on File Prior to Visit  Medication Sig Dispense Refill  . allopurinol (ZYLOPRIM) 300 MG tablet Take 450 mg by mouth daily.     [name] amLODipine (NORVASC) 5 MG tablet TAKE 2 TABLETS (10 MG TOTAL) BY MOUTH DAILY. 60 tablet 11  . Ascorbic Acid (VITAMIN C) 500 MG tablet Take 500 mg by mouth every morning.     [name] aspirin 81 MG tablet Take 81 mg by mouth daily.    [name] atorvastatin (LIPITOR) 80 MG tablet TAKE ONE TABLET BY MOUTH AT BEDTIME 90 tablet 0  . esomeprazole (NEXIUM) 40 MG capsule Take 1 capsule (40 mg total) by mouth daily. 30 capsule 5  .  fenofibrate 160 MG tablet TAKE 1 TABLET BY MOUTH EVERY DAY 30 tablet 5  . fluticasone (FLONASE) 50 MCG/ACT nasal spray INHALE ONE SPRAY INTO EACH NOSTRIL TWICE DAILY. 16 g 5  . FREESTYLE LITE test strip TEST ONE TIME DAILY 100 each 3  .  furosemide (LASIX) 40 MG tablet Take 2 tablets (80 mg total) by mouth 2 (two) times daily. 360 tablet 3  . glimepiride (AMARYL) 2 MG tablet Take 1 tablet (2 mg total) by mouth daily with breakfast. 90 tablet 1  . Lancets (FREESTYLE) lancets TEST ONE TIME DAILY 100 each 3  . metFORMIN (GLUCOPHAGE) 1000 MG tablet TAKE ONE AND ONE-HALF TABLETS BY MOUTH EVERY MORNING AND 1 TALBET IN THE EVENING. 225 tablet 1  . metoprolol tartrate (LOPRESSOR) 25 MG tablet TAKE HALF TABLET BY MOUTH TWICE DAILY 30 tablet 9  . montelukast (SINGULAIR) 10 MG tablet Take 1 tablet (10 mg total) by mouth at bedtime. 90 tablet 1  . nitroGLYCERIN (NITROSTAT) 0.4 MG SL tablet Place 0.4 mg under the tongue every 5 (five) minutes as needed for chest pain.    . potassium chloride SA (K-DUR,KLOR-CON) 20 MEQ tablet TAKE ONE TABLET BY MOUTH TWICE DAILY 60 tablet 11  . pregabalin (LYRICA) 75 MG capsule Take 75 mg by mouth 2 (two) times daily.    . psyllium (HYDROCIL/METAMUCIL) 95 % PACK Take 1 packet by mouth 2 (two) times daily.    [name] ZETIA 10 MG tablet TAKE ONE TABLET BY MOUTH DAILY 30 tablet 8   No current facility-administered medications on file prior to visit.    BP 136/86 mmHg  Pulse 71  Temp(Src) 98.5 F (36.9 C) (Oral)  Ht 6' (1.829 m)  Wt 265 lb (120.203 kg)  BMI 35.93 kg/m2  SpO2 90%       Objective:   Physical Exam  General  Mental Status - Alert. General Appearance - Well groomed. Not in acute distress.  Skin Rashes- No Rashes.  HEENT Head- Normal. Ear Auditory Canal - Left- Normal. Right - Normal.Tympanic Membrane- Left- Normal. Right- Normal. Eye Sclera/Conjunctiva- Left- Normal. Right- Normal. Nose & Sinuses Nasal Mucosa- Left-  Boggy and Congested. Right-  Boggy  and  Congested.Bilateral maxillary and frontal sinus pressure. Mouth & Throat Lips: Upper Lip- Normal: no dryness, cracking, pallor, cyanosis, or vesicular eruption. Lower Lip-Normal: no dryness, cracking, pallor, cyanosis or vesicular eruption. Buccal Mucosa- Bilateral- No Aphthous ulcers. Oropharynx- No Discharge or Erythema. +pnd. Tonsils: Characteristics- Bilateral- No Erythema or Congestion. Size/Enlargement- Bilateral- No enlargement. Discharge- bilateral-None.  Neck Neck- Supple. No Masses.   Chest and Lung Exam Auscultation: Breath Sounds:-Clear even and unlabored.  Cardiovascular Auscultation:Rythm- Regular, rate and rhythm. Murmurs & Other Heart Sounds:Ausculatation of the heart reveal- No Murmurs.  Lymphatic Head & Neck General Head & Neck Lymphatics: Bilateral: Description- No Localized lymphadenopathy.       Assessment & Plan:  You appear to have sinus infection and bronchitis. Also some history of allergies this time of the year as well.  Rx azithromycin antibiotic. Rx benzonatate for cough. Continue your flonase.  You may have recent reflux flare since you report some burning type sensation upper esophagus like. Continue nexium and add ranitidine.  Also will put in a1c and cmp order to assess diabetes.  Follow up in 7-10 days or as needed   Holland Kotter, Percell Miller, [employer]

## 2016-01-09 LAB — COMPREHENSIVE METABOLIC PANEL
ALBUMIN: 4.4 g/dL (ref 3.5–5.2)
ALK PHOS: 75 U/L (ref 39–117)
ALT: 15 U/L (ref 0–53)
AST: 20 U/L (ref 0–37)
BUN: 24 mg/dL — AB (ref 6–23)
CALCIUM: 10 mg/dL (ref 8.4–10.5)
CHLORIDE: 102 meq/L (ref 96–112)
CO2: 32 mEq/L (ref 19–32)
CREATININE: 1.42 mg/dL (ref 0.40–1.50)
GFR: 51.32 mL/min — ABNORMAL LOW (ref >60.00)
Glucose, Bld: 98 mg/dL (ref 70–99)
Potassium: 4.1 mEq/L (ref 3.5–5.1)
SODIUM: 140 meq/L (ref 135–145)
TOTAL PROTEIN: 7.5 g/dL (ref 6.0–8.3)
Total Bilirubin: 0.5 mg/dL (ref 0.2–1.2)

## 2016-01-09 LAB — HEMOGLOBIN A1C: Hgb A1c MFr Bld: 6.8 % — ABNORMAL HIGH (ref 4.6–6.5)

## 2016-01-10 ENCOUNTER — Encounter: Payer: Self-pay | Admitting: Adult Health

## 2016-01-17 ENCOUNTER — Ambulatory Visit (HOSPITAL_BASED_OUTPATIENT_CLINIC_OR_DEPARTMENT_OTHER): Payer: Medicare Other | Admitting: Hematology & Oncology

## 2016-01-17 ENCOUNTER — Other Ambulatory Visit: Payer: Self-pay

## 2016-01-17 ENCOUNTER — Encounter: Payer: Self-pay | Admitting: Hematology & Oncology

## 2016-01-17 ENCOUNTER — Ambulatory Visit (HOSPITAL_BASED_OUTPATIENT_CLINIC_OR_DEPARTMENT_OTHER): Payer: Medicare Other

## 2016-01-17 ENCOUNTER — Other Ambulatory Visit (HOSPITAL_BASED_OUTPATIENT_CLINIC_OR_DEPARTMENT_OTHER): Payer: Medicare Other

## 2016-01-17 VITALS — BP 145/70 | HR 73 | Temp 97.7°F | Resp 18 | Ht 72.0 in | Wt 263.0 lb

## 2016-01-17 DIAGNOSIS — K909 Intestinal malabsorption, unspecified: Secondary | ICD-10-CM

## 2016-01-17 DIAGNOSIS — D510 Vitamin B12 deficiency anemia due to intrinsic factor deficiency: Secondary | ICD-10-CM | POA: Diagnosis not present

## 2016-01-17 DIAGNOSIS — K922 Gastrointestinal hemorrhage, unspecified: Secondary | ICD-10-CM

## 2016-01-17 DIAGNOSIS — D509 Iron deficiency anemia, unspecified: Secondary | ICD-10-CM

## 2016-01-17 DIAGNOSIS — D519 Vitamin B12 deficiency anemia, unspecified: Secondary | ICD-10-CM

## 2016-01-17 DIAGNOSIS — D500 Iron deficiency anemia secondary to blood loss (chronic): Secondary | ICD-10-CM

## 2016-01-17 LAB — CBC WITH DIFFERENTIAL (CANCER CENTER ONLY)
BASO#: 0 10*3/uL (ref 0.0–0.2)
BASO%: 0.4 % (ref 0.0–2.0)
EOS ABS: 0.4 10*3/uL (ref 0.0–0.5)
EOS%: 4.5 % (ref 0.0–7.0)
HEMATOCRIT: 40.8 % (ref 38.7–49.9)
HEMOGLOBIN: 13.2 g/dL (ref 13.0–17.1)
LYMPH#: 1.8 10*3/uL (ref 0.9–3.3)
LYMPH%: 19.4 % (ref 14.0–48.0)
MCH: 27.7 pg — AB (ref 28.0–33.4)
MCHC: 32.4 g/dL (ref 32.0–35.9)
MCV: 86 fL (ref 82–98)
MONO#: 0.7 10*3/uL (ref 0.1–0.9)
MONO%: 7.7 % (ref 0.0–13.0)
NEUT%: 68 % (ref 40.0–80.0)
NEUTROS ABS: 6.2 10*3/uL (ref 1.5–6.5)
Platelets: 281 10*3/uL (ref 145–400)
RBC: 4.76 10*6/uL (ref 4.20–5.70)
RDW: 15.7 % (ref 11.1–15.7)
WBC: 9.1 10*3/uL (ref 4.0–10.0)

## 2016-01-17 MED ORDER — CYANOCOBALAMIN 1000 MCG/ML IJ SOLN
INTRAMUSCULAR | Status: AC
  Filled 2016-01-17: qty 1

## 2016-01-17 MED ORDER — CYANOCOBALAMIN 1000 MCG/ML IJ SOLN
1000.0000 ug | Freq: Once | INTRAMUSCULAR | Status: AC
  Administered 2016-01-17: 1000 ug via INTRAMUSCULAR

## 2016-01-17 NOTE — Progress Notes (Signed)
Hematology and Oncology Follow Up Visit  Richard [name] [id number] 1940-07-20 75 y.o. 01/17/2016   Principle Diagnosis:   Iron deficiency anemia secondary to malabsorption and GI blood loss.  Pernicious anemia secondary to malabsorption - (+) intrinsic factor antibodies  Current Therapy:    IV iron as indicated  Vitamin B-12 1 mg subcutaneous every month.     Interim History:  Richard [name] is back for follow-up. He is doing okay. He has been playing golf everyday for the past 3 weeks. He is enjoying this.  He does have intrinsic factor antibodies. This probably explains his pernicious anemia.  He last iron back in March. We saw him in May, his ferritin was 141 with iron saturation of 19%. We will see what his iron levels are today.   He's had no nausea or vomiting. He still has occasional tingling in the fingers.   His last vitamin B-12 level back in May was 277.   He's had no cough. He's had no rashes. He's had no weight loss awakening. He's had no change in bowel or bladder habits.   Overall, his performance status is ECOG 1.  Medications:  Current outpatient prescriptions:  .  allopurinol (ZYLOPRIM) 300 MG tablet, Take 450 mg by mouth daily. , Disp: , Rfl:  .  amLODipine (NORVASC) 5 MG tablet, TAKE 2 TABLETS (10 MG TOTAL) BY MOUTH DAILY., Disp: 60 tablet, Rfl: 11 .  Ascorbic Acid (VITAMIN C) 500 MG tablet, Take 500 mg by mouth every morning. , Disp: , Rfl:  .  aspirin 81 MG tablet, Take 81 mg by mouth daily., Disp: , Rfl:  .  atorvastatin (LIPITOR) 80 MG tablet, TAKE ONE TABLET BY MOUTH AT BEDTIME, Disp: 90 tablet, Rfl: 0 .  esomeprazole (NEXIUM) 40 MG capsule, Take 1 capsule (40 mg total) by mouth daily., Disp: 30 capsule, Rfl: 5 .  fenofibrate 160 MG tablet, TAKE 1 TABLET BY MOUTH EVERY DAY, Disp: 30 tablet, Rfl: 5 .  fluticasone (FLONASE) 50 MCG/ACT nasal spray, INHALE ONE SPRAY INTO EACH NOSTRIL TWICE DAILY., Disp: 16 g, Rfl: 5 .  FREESTYLE LITE test strip, TEST ONE  TIME DAILY, Disp: 100 each, Rfl: 3 .  furosemide (LASIX) 40 MG tablet, Take 2 tablets (80 mg total) by mouth 2 (two) times daily., Disp: 360 tablet, Rfl: 3 .  glimepiride (AMARYL) 2 MG tablet, Take 1 tablet (2 mg total) by mouth daily with breakfast., Disp: 90 tablet, Rfl: 1 .  Lancets (FREESTYLE) lancets, TEST ONE TIME DAILY, Disp: 100 each, Rfl: 3 .  metFORMIN (GLUCOPHAGE) 1000 MG tablet, TAKE ONE AND ONE-HALF TABLETS BY MOUTH EVERY MORNING AND 1 TALBET IN THE EVENING., Disp: 225 tablet, Rfl: 1 .  metoprolol tartrate (LOPRESSOR) 25 MG tablet, TAKE HALF TABLET BY MOUTH TWICE DAILY, Disp: 30 tablet, Rfl: 9 .  montelukast (SINGULAIR) 10 MG tablet, Take 1 tablet (10 mg total) by mouth at bedtime., Disp: 90 tablet, Rfl: 1 .  nitroGLYCERIN (NITROSTAT) 0.4 MG SL tablet, Place 0.4 mg under the tongue every 5 (five) minutes as needed for chest pain., Disp: , Rfl:  .  potassium chloride SA (K-DUR,KLOR-CON) 20 MEQ tablet, TAKE ONE TABLET BY MOUTH TWICE DAILY, Disp: 60 tablet, Rfl: 11 .  pregabalin (LYRICA) 75 MG capsule, Take 75 mg by mouth 2 (two) times daily., Disp: , Rfl:  .  psyllium (HYDROCIL/METAMUCIL) 95 % PACK, Take 1 packet by mouth 2 (two) times daily., Disp: , Rfl:  .  ranitidine (ZANTAC) 150 MG capsule,  Take 1 capsule (150 mg total) by mouth 2 (two) times daily., Disp: 60 capsule, Rfl: 0 .  ZETIA 10 MG tablet, TAKE ONE TABLET BY MOUTH DAILY, Disp: 30 tablet, Rfl: 8  Allergies:  Allergies  Allergen Reactions  . Benazepril     angioedema; he is not a candidate for any angiotensin receptor blockers because of this significant allergic reaction. Because of a history of documented adverse serious drug reaction;Medi Alert bracelet  is recommended  . Hctz [Hydrochlorothiazide] Swelling    Tongue and lip swelling   . Aspirin Other (See Comments)    Gastritis, cant take 325 Mg aspirin     Past Medical History, Surgical history, Social history, and Family History were reviewed and  updated.  Review of Systems: As above  Physical Exam:  height is 6' (1.829 m) and weight is 263 lb (119.296 kg). His oral temperature is 97.7 F (36.5 C). His blood pressure is 145/70 and his pulse is 73. His respiration is 18.   Wt Readings from Last 3 Encounters:  01/17/16 263 lb (119.296 kg)  01/08/16 265 lb (120.203 kg)  12/21/15 266 lb (120.657 kg)     Head and neck exam shows no ocular or oral lesions. His conjunctiva are pink now. He has no palpable cervical or supraclavicular lymph nodes. Lungs are clear. Cardiac exam regular rate and rhythm with no murmurs, rubs or bruits. Abdomen is soft. He is obese. Has good bowel sounds. There is no fluid wave. There is no palpable liver or spleen tip. Back exam shows a laminectomy scar in the lumbar spine. No tenderness is noted over the spine, ribs or hips. Extremity shows no clubbing, cyanosis or edema. He may have some chronic nonpitting edema of his lower legs. Skin exam shows no rashes, ecchymoses or petechia. Neurological exam shows no focal neurological deficits.   Lab Results  Component Value Date   WBC 9.1 01/17/2016   HGB 13.2 01/17/2016   HCT 40.8 01/17/2016   MCV 86 01/17/2016   PLT 281 01/17/2016     Chemistry      Component Value Date/Time   NA 140 01/08/2016 1442   NA 138 08/23/2015 1448   K 4.1 01/08/2016 1442   K 4.0 08/23/2015 1448   CL 102 01/08/2016 1442   CL 103 08/23/2015 1448   CO2 32 01/08/2016 1442   CO2 25 08/23/2015 1448   BUN 24* 01/08/2016 1442   BUN 20 08/23/2015 1448   CREATININE 1.42 01/08/2016 1442   CREATININE 1.21 08/23/2015 1448   CREATININE 1.02 05/22/2011 1621      Component Value Date/Time   CALCIUM 10.0 01/08/2016 1442   CALCIUM 9.9 08/23/2015 1448   ALKPHOS 75 01/08/2016 1442   ALKPHOS 82 08/23/2015 1448   AST 20 01/08/2016 1442   AST 24 08/23/2015 1448   ALT 15 01/08/2016 1442   ALT 23 08/23/2015 1448   BILITOT 0.5 01/08/2016 1442   BILITOT 0.4 08/23/2015 1448          Impression and Plan: Richard [name] is  a 75 year old gentleman. He has iron deficiency anemia. This is from malabsorption. I does don't think he is able to absorb because of his medications. He also has some chronic low-grade GI bleeding. He's been evaluated for this.  He does have anti-intrinsic factor antibodies.  We will give him B-12 today. I don't think he needs any iron.  He'll come back monthly for B-12 injections.  I will plan to see him back in  3 months. I'm sure that he will need iron again at some point.   [name], MD 7/19/20173:38 PM

## 2016-01-17 NOTE — Patient Instructions (Signed)

## 2016-01-18 LAB — IRON AND TIBC
%SAT: 14 % — AB (ref 20–55)
Iron: 49 ug/dL (ref 42–163)
TIBC: 346 ug/dL (ref 202–409)
UIBC: 298 ug/dL (ref 117–376)

## 2016-01-18 LAB — VITAMIN B12: VITAMIN B 12: 253 pg/mL (ref 211–946)

## 2016-01-18 LAB — FERRITIN: FERRITIN: 142 ng/mL (ref 22–316)

## 2016-01-22 ENCOUNTER — Ambulatory Visit: Payer: Medicare Other

## 2016-01-22 NOTE — Progress Notes (Deleted)
Subjective:   Richard [name] is a 75 y.o. male who presents for an Initial Medicare Annual Wellness Visit.  Review of Systems  No ROS.  Medicare Wellness Visit.      Sleep patterns:    Home Safety/Smoke Alarms:   Living environment; residence and Firearm Safety:  Seat Belt Safety/Bike Helmet:    Counseling:   Eye Exam-  Dental-   Male:   [facility]-02/20/15 w/ Dr. Silvano Rusk; diverticulosis, otherwise normal     PSA- Lab Results  Component Value Date   PSA 0.65 09/27/2013   PSA 0.68 03/21/2010   PSA 0.57 12/15/2008        Objective:    There were no vitals filed for this visit. There is no height or weight on file to calculate BMI.  Current Medications (verified) Outpatient Encounter Prescriptions as of 01/22/2016  Medication Sig  . allopurinol (ZYLOPRIM) 300 MG tablet Take 450 mg by mouth daily.   [name] amLODipine (NORVASC) 5 MG tablet TAKE 2 TABLETS (10 MG TOTAL) BY MOUTH DAILY.  [name] Ascorbic Acid (VITAMIN C) 500 MG tablet Take 500 mg by mouth every morning.   [name] aspirin 81 MG tablet Take 81 mg by mouth daily.  [name] atorvastatin (LIPITOR) 80 MG tablet TAKE ONE TABLET BY MOUTH AT BEDTIME  . esomeprazole (NEXIUM) 40 MG capsule Take 1 capsule (40 mg total) by mouth daily.  . fenofibrate 160 MG tablet TAKE 1 TABLET BY MOUTH EVERY DAY  . fluticasone (FLONASE) 50 MCG/ACT nasal spray INHALE ONE SPRAY INTO EACH NOSTRIL TWICE DAILY.  [name] FREESTYLE LITE test strip TEST ONE TIME DAILY  . furosemide (LASIX) 40 MG tablet Take 2 tablets (80 mg total) by mouth 2 (two) times daily.  [name] glimepiride (AMARYL) 2 MG tablet Take 1 tablet (2 mg total) by mouth daily with breakfast.  . Lancets (FREESTYLE) lancets TEST ONE TIME DAILY  . metFORMIN (GLUCOPHAGE) 1000 MG tablet TAKE ONE AND ONE-HALF TABLETS BY MOUTH EVERY MORNING AND 1 TALBET IN THE EVENING.  . metoprolol tartrate (LOPRESSOR) 25 MG tablet TAKE HALF TABLET BY MOUTH TWICE DAILY  . montelukast (SINGULAIR) 10 MG tablet Take 1 tablet (10 mg  total) by mouth at bedtime.  . nitroGLYCERIN (NITROSTAT) 0.4 MG SL tablet Place 0.4 mg under the tongue every 5 (five) minutes as needed for chest pain.  . potassium chloride SA (K-DUR,KLOR-CON) 20 MEQ tablet TAKE ONE TABLET BY MOUTH TWICE DAILY  . pregabalin (LYRICA) 75 MG capsule Take 75 mg by mouth 2 (two) times daily.  . psyllium (HYDROCIL/METAMUCIL) 95 % PACK Take 1 packet by mouth 2 (two) times daily.  . ranitidine (ZANTAC) 150 MG capsule Take 1 capsule (150 mg total) by mouth 2 (two) times daily.  [name] ZETIA 10 MG tablet TAKE ONE TABLET BY MOUTH DAILY   No facility-administered encounter medications on file as of 01/22/2016.     Allergies (verified) Benazepril; Hctz [hydrochlorothiazide]; and Aspirin   History: Past Medical History:  Diagnosis Date  . Adenomatous colon polyp 05/01/2015   tubular  . Anemia   . Antritis (stomach)    mild  . Arthritis    "knees, hips, shoulders" (03/15/2014)  . CAD (coronary artery disease)   . Chronic lower back pain   . Diverticulosis    mild, left colon  . Duodenitis    chronic  . Esophageal stricture    X 4  . GERD (gastroesophageal reflux disease)   . Gout   . History of gout   .  HNP (herniated nucleus pulposus), lumbar   . HTN (hypertension)   . Migraines    "til age 57 yrs"  . Mixed hyperlipidemia   . OSA on CPAP    with 2L O2 at night  . Peptic stricture of esophagus   . Renal insufficiency   . Skin cancer    "right neck cut off; several frozen off my arms" (03/15/2014)  . Sliding hiatal hernia   . Spinal stenosis   . Type II diabetes mellitus ([place])   . Vitamin B12 deficiency   . Wenckebach block    a. brady down to 30s due to frequent block, metoprolol held in response.   Past Surgical History:  Procedure Laterality Date  . APPENDECTOMY  ~ 1952  . BACK SURGERY    . COLONOSCOPY W/ BIOPSIES AND POLYPECTOMY  2013  . CORONARY ANGIOPLASTY  1993  . CORONARY ANGIOPLASTY WITH STENT PLACEMENT  05/1997   "1"  . CORONARY  ARTERY BYPASS GRAFT  03/2000   "CABG X5"  . ESOPHAGEAL DILATION  X 3-4   Dr. Lyla Son; "last one was in the 1990's"  . ESOPHAGOGASTRODUODENOSCOPY     multiple  . FLEXIBLE SIGMOIDOSCOPY     multiple  . KNEE ARTHROSCOPY Left 2011   meniscus repair  . LEFT HEART CATHETERIZATION WITH CORONARY/GRAFT ANGIOGRAM N/A 03/16/2014   Procedure: LEFT HEART CATHETERIZATION WITH Beatrix Fetters;  Surgeon: Burnell Blanks, MD;  [facility] C[facility] LAB;  Service: Cardiovascular;  Laterality: N/A;  . LUMBAR LAMINECTOMY/DECOMPRESSION MICRODISCECTOMY Right 06/17/2013   Procedure: LUMBAR LAMINECTOMY MICRODISCECTOMY L4-L5 RIGHT EXCISION OF SYNOVIAL CYST RIGHT   (1 LEVEL) RIGHT PARTIAL FACETECTOMY;  Surgeon: Tobi Bastos, MD;  Location: [facility] ORS;  Service: Orthopedics;  Laterality: Right;  . MYELOGRAM  04/06/13   lumbar, Dr Gladstone Lighter  . PANENDOSCOPY    . SHOULDER SURGERY Right 08/2010   screws placed; "tendons tore off"  . SKIN CANCER EXCISION Right    "neck"  . TONSILLECTOMY  ~ 1954  . UPPER GI ENDOSCOPY  2013   Gastritis; Dr Carlean Purl  . VASECTOMY     Family History  Problem Relation Age of Onset  . Stroke Father   . Hypertension Father   . Pancreatic cancer Mother   . Diabetes Maternal Grandmother   . Stroke Maternal Grandmother   . Heart attack Paternal Grandmother   . Colon cancer Neg Hx   . Esophageal cancer Neg Hx   . Rectal cancer Neg Hx   . Stomach cancer Neg Hx   . Ulcers Neg Hx    Social History   Occupational History  .  Retired   Social History Main Topics  . Smoking status: Never Smoker  . Smokeless tobacco: Never Used  . Alcohol use 0.0 oz/week     Comment: "last drink was in 2012"( 03/15/2014)  . Drug use: No  . Sexual activity: Not Currently   Tobacco Counseling Counseling given: Not Answered   Activities of Daily Living In your present state of health, do you have any difficulty performing the following activities: 09/18/2015  Hearing? N  Vision? N    Difficulty concentrating or making decisions? N  Walking or climbing stairs? Y  Dressing or bathing? N  Doing errands, shopping? N  Some recent data might be hidden    Immunizations and Health Maintenance Immunization History  Administered Date(s) Administered  . Influenza Split 06/12/2011, 05/31/2013  . Influenza Whole 05/01/2012  . Influenza, High Dose Seasonal PF 02/09/2015  . Influenza-Unspecified 02/11/2014  .  Pneumococcal Conjugate-13 09/18/2015  . Pneumococcal Polysaccharide-23 06/20/2013  . Td 03/20/2010, 03/16/2013   Health Maintenance Due  Topic Date Due  . ZOSTAVAX  07/22/1998  . URINE MICROALBUMIN  06/22/2015    Patient Care Team: [name], DO as PCP - General (Family Medicine) Estill Cotta, MD (Ophthalmology) Rigoberto Noel, MD as Consulting Physician (Pulmonary Disease) [name], MD as Consulting Physician (Oncology) Minus Breeding, MD as Consulting Physician (Cardiology)  Indicate any recent Medical Services you may have received from other than [facility] providers in the past year (date may be approximate).    Assessment:   This is a routine wellness examination for Richard [name]. Physical assessment deferred to PCP.  Hearing/Vision screen No exam data present  Dietary issues and exercise activities discussed:     Diet (meal preparation, eat out, water intake, caffeinated beverages, dairy products, fruits and vegetables): Breakfast: Lunch:  Dinner:      Goals    None     Depression Screen PHQ 2/9 Scores 04/03/2015 04/03/2015 08/30/2013  PHQ - 2 Score 0 0 0    Fall Risk Fall Risk  01/17/2016 10/18/2015 09/01/2015 08/30/2015 08/29/2015  Falls in the past year? No No No No No    Cognitive Function: No flowsheet data found.  Screening Tests Health Maintenance  Topic Date Due  . ZOSTAVAX  07/22/1998  . URINE MICROALBUMIN  06/22/2015  . INFLUENZA VACCINE  01/30/2016  . HEMOGLOBIN A1C  07/10/2016  . FOOT EXAM  09/17/2016  .  OPHTHALMOLOGY EXAM  10/05/2016  . COLONOSCOPY  02/19/2018  . TETANUS/TDAP  03/17/2023  . PNA vac Low Risk Adult  Completed        Plan:    Continue to eat heart healthy diet (full of fruits, vegetables, whole grains, lean protein, water--limit salt, fat, and sugar intake) and increase physical activity as tolerated. Continue doing brain stimulating activities (puzzles, reading, adult coloring books, staying active) to keep memory sharp.  Follow-up w/ Dr. Carollee Herter as scheduled.  During the course of the visit Richard [name] was educated and counseled about the following appropriate screening and preventive services:   Vaccines to include Pneumoccal, Influenza, Hepatitis B, Td, Zostavax, HCV  Electrocardiogram  Colorectal cancer screening  Cardiovascular disease screening  Diabetes screening  Glaucoma screening  Nutrition counseling  Prostate cancer screening  Smoking cessation counseling  Patient Instructions (the written plan) were given to the patient.   Dorrene German, RN   01/22/2016

## 2016-02-07 DIAGNOSIS — Z23 Encounter for immunization: Secondary | ICD-10-CM | POA: Diagnosis not present

## 2016-02-08 ENCOUNTER — Other Ambulatory Visit: Payer: Self-pay | Admitting: Family Medicine

## 2016-02-09 ENCOUNTER — Other Ambulatory Visit: Payer: Self-pay | Admitting: Cardiology

## 2016-02-09 NOTE — Telephone Encounter (Signed)
REFILL 

## 2016-02-14 ENCOUNTER — Ambulatory Visit (HOSPITAL_BASED_OUTPATIENT_CLINIC_OR_DEPARTMENT_OTHER): Payer: Medicare Other

## 2016-02-14 DIAGNOSIS — D509 Iron deficiency anemia, unspecified: Secondary | ICD-10-CM

## 2016-02-14 DIAGNOSIS — D510 Vitamin B12 deficiency anemia due to intrinsic factor deficiency: Secondary | ICD-10-CM

## 2016-02-14 DIAGNOSIS — D519 Vitamin B12 deficiency anemia, unspecified: Secondary | ICD-10-CM

## 2016-02-14 MED ORDER — CYANOCOBALAMIN 1000 MCG/ML IJ SOLN
1000.0000 ug | Freq: Once | INTRAMUSCULAR | Status: AC
  Administered 2016-02-14: 1000 ug via INTRAMUSCULAR

## 2016-02-14 MED ORDER — CYANOCOBALAMIN 1000 MCG/ML IJ SOLN
INTRAMUSCULAR | Status: AC
  Filled 2016-02-14: qty 1

## 2016-02-14 NOTE — Patient Instructions (Signed)

## 2016-03-13 ENCOUNTER — Ambulatory Visit (HOSPITAL_BASED_OUTPATIENT_CLINIC_OR_DEPARTMENT_OTHER): Payer: Medicare Other

## 2016-03-13 VITALS — BP 133/48 | HR 42 | Temp 97.9°F | Resp 16

## 2016-03-13 DIAGNOSIS — D509 Iron deficiency anemia, unspecified: Secondary | ICD-10-CM

## 2016-03-13 DIAGNOSIS — D510 Vitamin B12 deficiency anemia due to intrinsic factor deficiency: Secondary | ICD-10-CM

## 2016-03-13 DIAGNOSIS — D519 Vitamin B12 deficiency anemia, unspecified: Secondary | ICD-10-CM

## 2016-03-13 MED ORDER — CYANOCOBALAMIN 1000 MCG/ML IJ SOLN
1000.0000 ug | Freq: Once | INTRAMUSCULAR | Status: AC
  Administered 2016-03-13: 1000 ug via INTRAMUSCULAR

## 2016-03-13 MED ORDER — CYANOCOBALAMIN 1000 MCG/ML IJ SOLN
INTRAMUSCULAR | Status: AC
  Filled 2016-03-13: qty 1

## 2016-03-13 NOTE — Patient Instructions (Signed)

## 2016-03-15 ENCOUNTER — Encounter: Payer: Self-pay | Admitting: *Deleted

## 2016-03-15 ENCOUNTER — Ambulatory Visit (INDEPENDENT_AMBULATORY_CARE_PROVIDER_SITE_OTHER): Payer: Medicare Other | Admitting: *Deleted

## 2016-03-15 VITALS — BP 134/68 | HR 88 | Resp 18 | Ht 72.0 in | Wt 268.0 lb

## 2016-03-15 DIAGNOSIS — Z0000 Encounter for general adult medical examination without abnormal findings: Secondary | ICD-10-CM | POA: Diagnosis not present

## 2016-03-15 DIAGNOSIS — E119 Type 2 diabetes mellitus without complications: Secondary | ICD-10-CM

## 2016-03-15 DIAGNOSIS — E1169 Type 2 diabetes mellitus with other specified complication: Secondary | ICD-10-CM

## 2016-03-15 DIAGNOSIS — E669 Obesity, unspecified: Secondary | ICD-10-CM | POA: Diagnosis not present

## 2016-03-15 DIAGNOSIS — Z23 Encounter for immunization: Secondary | ICD-10-CM | POA: Diagnosis not present

## 2016-03-15 MED ORDER — ZOSTER VACCINE LIVE 19400 UNT/0.65ML ~~LOC~~ SUSR: 0.6500 mL | Freq: Once | SUBCUTANEOUS | 0 refills | Status: AC

## 2016-03-15 NOTE — Patient Instructions (Signed)
Follow-up w/ Dr. Carollee Herter as scheduled.   Continue to eat heart healthy diet (full of fruits, vegetables, whole grains, lean protein, water--limit salt, fat, and sugar intake) and increase physical activity as tolerated.  Continue doing brain stimulating activities (puzzles, reading, adult coloring books, staying active) to keep memory sharp. .  Schedule follow-up appt w/ Dr. Percival Spanish.   Follow-up with your eye doctor about your new glasses.

## 2016-03-15 NOTE — Progress Notes (Addendum)
Subjective:   Richard [name] is a 75 y.o. male who presents for an Initial Medicare Annual Wellness Visit.  Review of Systems  No ROS.  Medicare Wellness Visit.  Cardiac Risk Factors include: advanced age (>39men, >75 women);diabetes mellitus;dyslipidemia;hypertension;male gender  Sleep patterns: No sleep issues. Gets up 1-2x nightly to void. Wears CPAP every night.     Home Safety/Smoke Alarms: Lives alone. Plays golf w/ friends daily. Feels safe in home. Smoke alarms present.   Living environment; residence and Firearm Safety: Stored in a safe place.  Seat Belt Safety/Bike Helmet: Wears seat belt.   Counseling:   Eye Exam- Follows w/ eye doctor in [place] yearly. Got new glasses in Feb but only wore them for a few hours and then got a headache and has since been wearing his old glasses. Encouraged pt to follow-up with his eye doctor regarding his new glasses. Dental- Follows w/ Dr. Altha Harm every 6 mos   Male:   [facility]- 02/20/15 w/ Dr. Carlean Purl, diverticulosis in L colon, otherwise normal. Per Dr. Celesta Aver report, no repeat needed.      PSA-   Lab Results  Component Value Date   PSA 0.65 09/27/2013   PSA 0.68 03/21/2010   PSA 0.57 12/15/2008      Objective:    Today's Vitals   03/15/16 1415 03/15/16 1448  BP: (!) 144/66 134/68  Pulse: 88   Resp: 18   SpO2: 95%   Weight: 268 lb (121.6 kg)   Height: 6' (1.829 m)    Body mass index is 36.35 kg/m.  Current Medications (verified) Outpatient Encounter Prescriptions as of 03/15/2016  Medication Sig  . allopurinol (ZYLOPRIM) 300 MG tablet Take 450 mg by mouth daily.   [name] amLODipine (NORVASC) 5 MG tablet TAKE 2 TABLETS (10 MG TOTAL) BY MOUTH DAILY.  [name] Ascorbic Acid (VITAMIN C) 500 MG tablet Take 500 mg by mouth every morning.   [name] aspirin 81 MG tablet Take 81 mg by mouth daily.  [name] atorvastatin (LIPITOR) 80 MG tablet TAKE ONE TABLET BY MOUTH AT BEDTIME  . esomeprazole (NEXIUM) 40 MG capsule Take 1 capsule (40 mg total)  by mouth daily.  . fenofibrate 160 MG tablet TAKE 1 TABLET BY MOUTH EVERY DAY  . fluticasone (FLONASE) 50 MCG/ACT nasal spray INHALE ONE SPRAY INTO EACH NOSTRIL TWICE DAILY.  [name] FREESTYLE LITE test strip TEST ONE TIME DAILY  . furosemide (LASIX) 40 MG tablet Take 2 tablets (80 mg total) by mouth 2 (two) times daily.  [name] glimepiride (AMARYL) 2 MG tablet TAKE 1 TABLET (2 MG TOTAL) BY MOUTH DAILY WITH BREAKFAST.  [name] Lancets (FREESTYLE) lancets TEST ONE TIME DAILY  . metFORMIN (GLUCOPHAGE) 1000 MG tablet TAKE ONE AND ONE-HALF TABLETS BY MOUTH EVERY MORNING AND 1 TALBET IN THE EVENING.  . metoprolol tartrate (LOPRESSOR) 25 MG tablet TAKE HALF TABLET BY MOUTH TWICE DAILY  . montelukast (SINGULAIR) 10 MG tablet Take 1 tablet (10 mg total) by mouth at bedtime.  . nitroGLYCERIN (NITROSTAT) 0.4 MG SL tablet Place 0.4 mg under the tongue every 5 (five) minutes as needed for chest pain.  . potassium chloride SA (K-DUR,KLOR-CON) 20 MEQ tablet TAKE ONE TABLET BY MOUTH TWICE DAILY  . pregabalin (LYRICA) 75 MG capsule Take 75 mg by mouth 2 (two) times daily.  . psyllium (HYDROCIL/METAMUCIL) 95 % PACK Take 1 packet by mouth 2 (two) times daily.  . ranitidine (ZANTAC) 150 MG capsule Take 1 capsule (150 mg total) by mouth 2 (two)  times daily.  [name] ZETIA 10 MG tablet TAKE ONE TABLET BY MOUTH DAILY  . Zoster Vaccine Live, PF, (ZOSTAVAX) 94765 UNT/0.65ML injection Inject 19,400 Units into the skin once.   No facility-administered encounter medications on file as of 03/15/2016.     Allergies (verified) Benazepril; Hctz [hydrochlorothiazide]; Lactose intolerance (gi); and Aspirin   History: Past Medical History:  Diagnosis Date  . Adenomatous colon polyp 05/01/2015   tubular  . Anemia   . Antritis (stomach)    mild  . Arthritis    "knees, hips, shoulders" (03/15/2014)  . CAD (coronary artery disease)   . Chronic lower back pain   . Diverticulosis    mild, left colon  . Duodenitis    chronic  . Esophageal  stricture    X 4  . GERD (gastroesophageal reflux disease)   . Gout   . History of gout   . HNP (herniated nucleus pulposus), lumbar   . HTN (hypertension)   . Migraines    "til age 63 yrs"  . Mixed hyperlipidemia   . OSA on CPAP    with 2L O2 at night  . Peptic stricture of esophagus   . Renal insufficiency   . Skin cancer    "right neck cut off; several frozen off my arms" (03/15/2014)  . Sliding hiatal hernia   . Spinal stenosis   . Type II diabetes mellitus ([employer])   . Vitamin B12 deficiency   . Wenckebach block    a. brady down to 30s due to frequent block, metoprolol held in response.   Past Surgical History:  Procedure Laterality Date  . APPENDECTOMY  ~ 1952  . BACK SURGERY    . COLONOSCOPY W/ BIOPSIES AND POLYPECTOMY  2013  . CORONARY ANGIOPLASTY  1993  . CORONARY ANGIOPLASTY WITH STENT PLACEMENT  05/1997   "1"  . CORONARY ARTERY BYPASS GRAFT  03/2000   "CABG X5"  . ESOPHAGEAL DILATION  X 3-4   Dr. Lyla Son; "last one was in the 1990's"  . ESOPHAGOGASTRODUODENOSCOPY     multiple  . FLEXIBLE SIGMOIDOSCOPY     multiple  . KNEE ARTHROSCOPY Left 2011   meniscus repair  . LEFT HEART CATHETERIZATION WITH CORONARY/GRAFT ANGIOGRAM N/A 03/16/2014   Procedure: LEFT HEART CATHETERIZATION WITH Beatrix Fetters;  Surgeon: Burnell Blanks, MD;  Location: [facility] CATH LAB;  Service: Cardiovascular;  Laterality: N/A;  . LUMBAR LAMINECTOMY/DECOMPRESSION MICRODISCECTOMY Right 06/17/2013   Procedure: LUMBAR LAMINECTOMY MICRODISCECTOMY L4-L5 RIGHT EXCISION OF SYNOVIAL CYST RIGHT   (1 LEVEL) RIGHT PARTIAL FACETECTOMY;  Surgeon: Tobi Bastos, MD;  Location: [facility] ORS;  Service: Orthopedics;  Laterality: Right;  . MYELOGRAM  04/06/13   lumbar, Dr Gladstone Lighter  . PANENDOSCOPY    . SHOULDER SURGERY Right 08/2010   screws placed; "tendons tore off"  . SKIN CANCER EXCISION Right    "neck"  . TONSILLECTOMY  ~ 1954  . UPPER GI ENDOSCOPY  2013   Gastritis; Dr Carlean Purl  . VASECTOMY      Family History  Problem Relation Age of Onset  . Stroke Father   . Hypertension Father   . Pancreatic cancer Mother   . Diabetes Maternal Grandmother   . Stroke Maternal Grandmother   . Heart attack Paternal Grandmother   . Colon cancer Neg Hx   . Esophageal cancer Neg Hx   . Rectal cancer Neg Hx   . Stomach cancer Neg Hx   . Ulcers Neg Hx    Social History   Occupational  History  .  Retired   Social History Main Topics  . Smoking status: Never Smoker  . Smokeless tobacco: Never Used  . Alcohol use No     Comment: "last drink was in 2012"( 03/15/2014)  . Drug use: No  . Sexual activity: Not Currently   Tobacco Counseling Counseling given: Not Answered   Activities of Daily Living In your present state of health, do you have any difficulty performing the following activities: 03/15/2016 09/18/2015  Hearing? N N  Vision? N N  Difficulty concentrating or making decisions? N N  Walking or climbing stairs? N Y  Dressing or bathing? N N  Doing errands, shopping? N N  Preparing Food and eating ? N -  Using the Toilet? N -  In the past six months, have you accidently leaked urine? N -  Do you have problems with loss of bowel control? N -  Managing your Medications? N -  Managing your Finances? N -  Housekeeping or managing your Housekeeping? N -  Some recent data might be hidden    Immunizations and Health Maintenance Immunization History  Administered Date(s) Administered  . Influenza Split 06/12/2011, 05/31/2013  . Influenza Whole 05/01/2012  . Influenza, High Dose Seasonal PF 02/09/2015  . Influenza-Unspecified 02/11/2014, 02/29/2016  . Pneumococcal Conjugate-13 09/18/2015  . Pneumococcal Polysaccharide-23 06/20/2013  . Td 03/20/2010, 03/16/2013   Health Maintenance Due  Topic Date Due  . ZOSTAVAX  07/22/1998  . URINE MICROALBUMIN  06/22/2015    Patient Care Team: [name], DO as PCP - General (Family Medicine) Estill Cotta, MD  (Ophthalmology) Rigoberto Noel, MD as Consulting Physician (Pulmonary Disease) [name], MD as Consulting Physician (Oncology) Minus Breeding, MD as Consulting Physician (Cardiology) Melvenia Needles, NP as Nurse Practitioner (Pulmonary Disease) [place] (Dermatology)  Indicate any recent Medical Services you may have received from other than [facility] providers in the past year (date may be approximate).    Assessment:   This is a routine wellness examination for Richard [name]. Physical assessment deferred to PCP.  Hearing/Vision screen  Hearing Screening   125Hz  250Hz  500Hz  1000Hz  2000Hz  3000Hz  4000Hz  6000Hz  8000Hz   Right ear:   Pass Pass Pass  Fail    Left ear:   Pass Pass Pass  Pass    Comments: Able to hear conversational tones w/o difficulty. No issues reported.   Visual Acuity Screening   Right eye Left eye Both eyes  Without correction:     With correction: 20/30 20/30 20/20     Dietary issues and exercise activities discussed: Current Exercise Habits: The patient has a physically strenous job, but has no regular exercise apart from work., Exercise limited by: orthopedic condition(s) (Arthritis in knees and hips)  Diet (meal preparation, eat out, water intake, caffeinated beverages, dairy products, fruits and vegetables): [employer] veggie plate for dinner. Trying to decrease sugar intake. Drinks 8-10 bottles of water daily. Drinks Diet Pepsi. Breakfast: Cereal w/ fresh fruit and lactose free milk Lunch: Sandwich or two, sometimes soup  Pt concerns: Pt seen at [facility] on Wednesday and HR was 42. Was told by pharmacist to hold BB for HR < 60. HR this morning was 58, so he held metoprolol. HR today 88. Pt reports he feels well today, but has noticed increased daytime sleepiness when HR is low. Pt instructed to follow-up w/ cardiology, ED if alarm symptoms.  Goals    . Cut out extra servings    . Lose 20 lbs  Depression Screen PHQ 2/9 Scores 03/15/2016 04/03/2015  04/03/2015 08/30/2013  PHQ - 2 Score 0 0 0 0    Fall Risk Fall Risk  03/15/2016 03/13/2016 01/17/2016 10/18/2015 09/01/2015  Falls in the past year? Yes No No No No  Number falls in past yr: 1 - - - -  Injury with Fall? No - - - -    Cognitive Function: MMSE - Mini Mental State Exam 03/15/2016  Orientation to time 5  Orientation to Place 5  Registration 3  Attention/ Calculation 5  Recall 2  Language- name 2 objects 2  Language- repeat 1  Language- follow 3 step command 3  Language- read & follow direction 1  Write a sentence 1  Copy design 1  Total score 29    Screening Tests Health Maintenance  Topic Date Due  . ZOSTAVAX  07/22/1998  . URINE MICROALBUMIN  06/22/2015  . HEMOGLOBIN A1C  07/10/2016  . FOOT EXAM  09/17/2016  . OPHTHALMOLOGY EXAM  10/05/2016  . COLONOSCOPY  02/19/2018  . TETANUS/TDAP  03/17/2023  . INFLUENZA VACCINE  Completed  . PNA vac Low Risk Adult  Completed        Plan:   Follow-up w/ Dr. Carollee Herter as scheduled.   Continue to eat heart healthy diet (full of fruits, vegetables, whole grains, lean protein, water--limit salt, fat, and sugar intake) and increase physical activity as tolerated.  Continue doing brain stimulating activities (puzzles, reading, adult coloring books, staying active) to keep memory sharp. .  Schedule follow-up appt w/ Dr. Percival Spanish.   Follow-up with your eye doctor about your new glasses.  During the course of the visit Richard [name] was educated and counseled about the following appropriate screening and preventive services:   Vaccines to include Pneumoccal, Influenza, Hepatitis B, Td, Zostavax, HCV  Colorectal cancer screening  Cardiovascular disease screening  Diabetes screening  Glaucoma screening  Nutrition counseling  Prostate cancer screening  Patient Instructions (the written plan) were given to the patient.   Dorrene German, RN   03/15/2016    Reviewed   Doree Fudge, do   03/15/2016

## 2016-03-15 NOTE — Progress Notes (Signed)
Pre visit review using our clinic review tool, if applicable. No additional management support is needed unless otherwise documented below in the visit note. 

## 2016-03-15 NOTE — Assessment & Plan Note (Signed)
Pt reports compliance w/ medication. UTD on A1c. He is trying to improve diet and decrease concentrated sweets. Checks fasting CBG daily, usually around low 100s per pt. F/u w/ PCP scheduled.

## 2016-03-16 LAB — MICROALBUMIN / CREATININE URINE RATIO
Creatinine, Urine: 77 mg/dL (ref 20–370)
MICROALB/CREAT RATIO: 5 ug/mg{creat} (ref <30)
Microalb, Ur: 0.4 mg/dL

## 2016-03-27 ENCOUNTER — Telehealth: Payer: Self-pay | Admitting: Family Medicine

## 2016-03-27 NOTE — Telephone Encounter (Signed)
Pt misunderstood some of the verbage on urine testing re: ADA recommendations. Advised pt results were normal. Pt voices understanding.

## 2016-03-27 NOTE — Telephone Encounter (Signed)
Patient Relation: Self Patient Phone: [phone]  Patient would like a call to go over lab results for his urine test.

## 2016-04-10 ENCOUNTER — Other Ambulatory Visit: Payer: Self-pay | Admitting: Family Medicine

## 2016-04-15 DIAGNOSIS — M545 Low back pain: Secondary | ICD-10-CM | POA: Diagnosis not present

## 2016-04-15 DIAGNOSIS — M1712 Unilateral primary osteoarthritis, left knee: Secondary | ICD-10-CM | POA: Diagnosis not present

## 2016-04-16 ENCOUNTER — Ambulatory Visit: Payer: Medicare Other | Admitting: Family Medicine

## 2016-04-17 ENCOUNTER — Ambulatory Visit (HOSPITAL_BASED_OUTPATIENT_CLINIC_OR_DEPARTMENT_OTHER): Payer: Medicare Other | Admitting: Hematology & Oncology

## 2016-04-17 ENCOUNTER — Other Ambulatory Visit (HOSPITAL_BASED_OUTPATIENT_CLINIC_OR_DEPARTMENT_OTHER): Payer: Medicare Other

## 2016-04-17 ENCOUNTER — Encounter: Payer: Self-pay | Admitting: Hematology & Oncology

## 2016-04-17 ENCOUNTER — Ambulatory Visit (HOSPITAL_BASED_OUTPATIENT_CLINIC_OR_DEPARTMENT_OTHER): Payer: Medicare Other

## 2016-04-17 VITALS — BP 140/67 | HR 64 | Temp 98.0°F | Resp 16 | Ht 72.0 in | Wt 263.8 lb

## 2016-04-17 DIAGNOSIS — D509 Iron deficiency anemia, unspecified: Secondary | ICD-10-CM

## 2016-04-17 DIAGNOSIS — D508 Other iron deficiency anemias: Secondary | ICD-10-CM

## 2016-04-17 DIAGNOSIS — D510 Vitamin B12 deficiency anemia due to intrinsic factor deficiency: Secondary | ICD-10-CM | POA: Diagnosis not present

## 2016-04-17 DIAGNOSIS — K909 Intestinal malabsorption, unspecified: Secondary | ICD-10-CM

## 2016-04-17 DIAGNOSIS — D519 Vitamin B12 deficiency anemia, unspecified: Secondary | ICD-10-CM

## 2016-04-17 DIAGNOSIS — K922 Gastrointestinal hemorrhage, unspecified: Secondary | ICD-10-CM

## 2016-04-17 DIAGNOSIS — D500 Iron deficiency anemia secondary to blood loss (chronic): Secondary | ICD-10-CM

## 2016-04-17 LAB — COMPREHENSIVE METABOLIC PANEL (CC13)
ALBUMIN: 4.6 g/dL (ref 3.5–4.8)
ALT: 19 IU/L (ref 0–44)
AST (SGOT): 21 IU/L (ref 0–40)
Albumin/Globulin Ratio: 1.4 (ref 1.2–2.2)
Alkaline Phosphatase, S: 75 IU/L (ref 39–117)
BILIRUBIN TOTAL: 0.4 mg/dL (ref 0.0–1.2)
BUN / CREAT RATIO: 16 (ref 10–24)
BUN: 22 mg/dL (ref 8–27)
CALCIUM: 10.2 mg/dL (ref 8.6–10.2)
CHLORIDE: 101 mmol/L (ref 96–106)
CO2: 30 mmol/L — AB (ref 18–29)
CREATININE: 1.38 mg/dL — AB (ref 0.76–1.27)
GFR, EST AFRICAN AMERICAN: 57 mL/min/{1.73_m2} — AB (ref >59)
GFR, EST NON AFRICAN AMERICAN: 49 mL/min/{1.73_m2} — AB (ref >59)
GLUCOSE: 95 mg/dL (ref 65–99)
Globulin, Total: 3.3 g/dL (ref 1.5–4.5)
Potassium, Ser: 4.4 mmol/L (ref 3.5–5.2)
Sodium: 139 mmol/L (ref 134–144)
TOTAL PROTEIN: 7.9 g/dL (ref 6.0–8.5)

## 2016-04-17 LAB — CBC WITH DIFFERENTIAL (CANCER CENTER ONLY)
BASO#: 0 10*3/uL (ref 0.0–0.2)
BASO%: 0.2 % (ref 0.0–2.0)
EOS ABS: 0.1 10*3/uL (ref 0.0–0.5)
EOS%: 1 % (ref 0.0–7.0)
HCT: 40.7 % (ref 38.7–49.9)
HEMOGLOBIN: 13.2 g/dL (ref 13.0–17.1)
LYMPH#: 1.6 10*3/uL (ref 0.9–3.3)
LYMPH%: 16.1 % (ref 14.0–48.0)
MCH: 27.2 pg — ABNORMAL LOW (ref 28.0–33.4)
MCHC: 32.4 g/dL (ref 32.0–35.9)
MCV: 84 fL (ref 82–98)
MONO#: 0.7 10*3/uL (ref 0.1–0.9)
MONO%: 7.5 % (ref 0.0–13.0)
NEUT%: 75.2 % (ref 40.0–80.0)
NEUTROS ABS: 7.3 10*3/uL — AB (ref 1.5–6.5)
PLATELETS: 280 10*3/uL (ref 145–400)
RBC: 4.85 10*6/uL (ref 4.20–5.70)
RDW: 15.6 % (ref 11.1–15.7)
WBC: 9.7 10*3/uL (ref 4.0–10.0)

## 2016-04-17 MED ORDER — CYANOCOBALAMIN 1000 MCG/ML IJ SOLN
INTRAMUSCULAR | Status: AC
  Filled 2016-04-17: qty 1

## 2016-04-17 MED ORDER — CYANOCOBALAMIN 1000 MCG/ML IJ SOLN
1000.0000 ug | Freq: Once | INTRAMUSCULAR | Status: AC
  Administered 2016-04-17: 1000 ug via INTRAMUSCULAR

## 2016-04-17 NOTE — Patient Instructions (Signed)

## 2016-04-17 NOTE — Progress Notes (Signed)
Hematology and Oncology Follow Up Visit  LAWTON DOLLINGER [id number] 08-14-1940 75 y.o. 04/17/2016   Principle Diagnosis:   Iron deficiency anemia secondary to malabsorption and GI blood loss.  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     Interim History:  Mr. Dente is back for follow-up. He is doing okay. He has been playing golf everyday for the past 3 weeks. He is enjoying this.  He does have intrinsic factor antibodies. This probably explains his pernicious anemia.  We saw him back in July, his ferritin was 142 was iron saturation was only 14%. His ferritin is elevated because he has bad arthritis. It sounds like he will need surgery for his knees.   He has had no obvious bleeding. He's had no change in medications. He is on quite a few medications. He is diabetic and this also probably makes the iron absorption less efficient.   Overall, his performance status is ECOG 1.  Medications:  Current Outpatient Prescriptions:  .  allopurinol (ZYLOPRIM) 300 MG tablet, Take 450 mg by mouth daily. , Disp: , Rfl:  .  amLODipine (NORVASC) 5 MG tablet, TAKE 2 TABLETS (10 MG TOTAL) BY MOUTH DAILY., Disp: 60 tablet, Rfl: 11 .  Ascorbic Acid (VITAMIN C) 500 MG tablet, Take 500 mg by mouth every morning. , Disp: , Rfl:  .  aspirin 81 MG tablet, Take 81 mg by mouth daily., Disp: , Rfl:  .  atorvastatin (LIPITOR) 80 MG tablet, TAKE ONE TABLET BY MOUTH AT BEDTIME, Disp: 90 tablet, Rfl: 3 .  esomeprazole (NEXIUM) 40 MG capsule, Take 1 capsule (40 mg total) by mouth daily., Disp: 30 capsule, Rfl: 5 .  fenofibrate 160 MG tablet, TAKE 1 TABLET BY MOUTH EVERY DAY, Disp: 30 tablet, Rfl: 5 .  fluticasone (FLONASE) 50 MCG/ACT nasal spray, INHALE ONE SPRAY INTO EACH NOSTRIL TWICE DAILY., Disp: 1 g, Rfl: 6 .  FREESTYLE LITE test strip, TEST ONE TIME  DAILY, Disp: 100 each, Rfl: 3 .  furosemide (LASIX) 40 MG tablet, Take 2 tablets (80 mg total) by mouth 2 (two) times daily., Disp: 360 tablet, Rfl: 3 .  glimepiride (AMARYL) 2 MG tablet, TAKE 1 TABLET (2 MG TOTAL) BY MOUTH DAILY WITH BREAKFAST., Disp: 90 tablet, Rfl: 1 .  Lancets (FREESTYLE) lancets, TEST ONE TIME DAILY, Disp: 100 each, Rfl: 3 .  metFORMIN (GLUCOPHAGE) 1000 MG tablet, TAKE ONE AND ONE-HALF TABLETS BY MOUTH EVERY MORNING AND 1 TALBET IN THE EVENING., Disp: 225 tablet, Rfl: 1 .  metoprolol tartrate (LOPRESSOR) 25 MG tablet, TAKE HALF TABLET BY MOUTH TWICE DAILY, Disp: 30 tablet, Rfl: 9 .  montelukast (SINGULAIR) 10 MG tablet, Take 1 tablet (10 mg total) by mouth at bedtime., Disp: 90 tablet, Rfl: 1 .  nitroGLYCERIN (NITROSTAT) 0.4 MG SL tablet, Place 0.4 mg under the tongue every 5 (five) minutes as needed for chest pain., Disp: , Rfl:  .  potassium chloride SA (K-DUR,KLOR-CON) 20 MEQ tablet, TAKE ONE TABLET BY MOUTH TWICE DAILY, Disp: 60 tablet, Rfl: 11 .  pregabalin (LYRICA) 75 MG capsule, Take 75 mg by mouth 2 (two) times daily., Disp: , Rfl:  .  psyllium (HYDROCIL/METAMUCIL) 95 % PACK, Take 1 packet by mouth 2 (two) times daily., Disp: , Rfl:  .  ranitidine (ZANTAC) 150 MG capsule, Take 1 capsule (150 mg total) by mouth  2 (two) times daily., Disp: 60 capsule, Rfl: 0 .  ZETIA 10 MG tablet, TAKE ONE TABLET BY MOUTH DAILY, Disp: 30 tablet, Rfl: 8  Allergies:  Allergies  Allergen Reactions  . Benazepril     angioedema; he is not a candidate for any angiotensin receptor blockers because of this significant allergic reaction. Because of a history of documented adverse serious drug reaction;Medi Alert bracelet  is recommended  . Hctz [Hydrochlorothiazide] Swelling    Tongue and lip swelling   . Lactose Intolerance (Gi)   . Aspirin Other (See Comments)    Gastritis, cant take 325 Mg aspirin     Past Medical History, Surgical history, Social history, and Family History were  reviewed and updated.  Review of Systems: As above  Physical Exam:  height is 6' (1.829 m) and weight is 263 lb 12.8 oz (119.7 kg). His oral temperature is 98 F (36.7 C). His blood pressure is 140/67 and his pulse is 64. His respiration is 16.   Wt Readings from Last 3 Encounters:  04/17/16 263 lb 12.8 oz (119.7 kg)  03/15/16 268 lb (121.6 kg)  01/17/16 263 lb (119.3 kg)     Head and neck exam shows no ocular or oral lesions. His conjunctiva are pink now. He has no palpable cervical or supraclavicular lymph nodes. Lungs are clear. Cardiac exam regular rate and rhythm with no murmurs, rubs or bruits. Abdomen is soft. He is obese. Has good bowel sounds. There is no fluid wave. There is no palpable liver or spleen tip. Back exam shows a laminectomy scar in the lumbar spine. No tenderness is noted over the spine, ribs or hips. Extremity shows no clubbing, cyanosis or edema. He may have some chronic nonpitting edema of his lower legs. Skin exam shows no rashes, ecchymoses or petechia. Neurological exam shows no focal neurological deficits.   Lab Results  Component Value Date   WBC 9.7 04/17/2016   HGB 13.2 04/17/2016   HCT 40.7 04/17/2016   MCV 84 04/17/2016   PLT 280 04/17/2016     Chemistry      Component Value Date/Time   NA 140 01/08/2016 1442   NA 138 08/23/2015 1448   K 4.1 01/08/2016 1442   K 4.0 08/23/2015 1448   CL 102 01/08/2016 1442   CL 103 08/23/2015 1448   CO2 32 01/08/2016 1442   CO2 25 08/23/2015 1448   BUN 24 (H) 01/08/2016 1442   BUN 20 08/23/2015 1448   CREATININE 1.42 01/08/2016 1442   CREATININE 1.21 08/23/2015 1448   CREATININE 1.02 05/22/2011 1621      Component Value Date/Time   CALCIUM 10.0 01/08/2016 1442   CALCIUM 9.9 08/23/2015 1448   ALKPHOS 75 01/08/2016 1442   ALKPHOS 82 08/23/2015 1448   AST 20 01/08/2016 1442   AST 24 08/23/2015 1448   ALT 15 01/08/2016 1442   ALT 23 08/23/2015 1448   BILITOT 0.5 01/08/2016 1442   BILITOT 0.4  08/23/2015 1448         Impression and Plan: Mr. Bale is  a 75 year old gentleman. He has iron deficiency anemia. This is from malabsorption. I does don't think he is able to absorb because of his medications. He also has some chronic low-grade GI bleeding. He's been evaluated for this.  He does have anti-intrinsic factor antibodies.  We will give him B-12 today. We will see what his iron levels show. The fact that his MCV is going down, might indicate that his iron  is on the lower side.  He'll come back monthly for B-12 injections. At the beginning of 2018, we might be oh to move his B-12 injections to every 2 months.  I will plan to see him back in 3 months.  [name], MD 10/18/20174:19 PM

## 2016-04-18 ENCOUNTER — Other Ambulatory Visit: Payer: Self-pay

## 2016-04-18 LAB — VITAMIN B12: Vitamin B12: 252 pg/mL (ref 211–946)

## 2016-04-18 LAB — IRON AND TIBC
%SAT: 12 % — AB (ref 20–55)
Iron: 52 ug/dL (ref 42–163)
TIBC: 418 ug/dL — ABNORMAL HIGH (ref 202–409)
UIBC: 366 ug/dL (ref 117–376)

## 2016-04-18 LAB — RETICULOCYTES: Reticulocyte Count: 1.2 % (ref 0.6–2.6)

## 2016-04-18 LAB — FERRITIN: Ferritin: 65 ng/ml (ref 22–316)

## 2016-04-19 ENCOUNTER — Ambulatory Visit (INDEPENDENT_AMBULATORY_CARE_PROVIDER_SITE_OTHER): Payer: Medicare Other | Admitting: Medical

## 2016-04-19 VITALS — BP 131/60 | HR 67 | Temp 97.9°F | Resp 16 | Ht 72.0 in | Wt 260.6 lb

## 2016-04-19 DIAGNOSIS — E785 Hyperlipidemia, unspecified: Secondary | ICD-10-CM

## 2016-04-19 DIAGNOSIS — E669 Obesity, unspecified: Secondary | ICD-10-CM

## 2016-04-19 DIAGNOSIS — E1169 Type 2 diabetes mellitus with other specified complication: Secondary | ICD-10-CM | POA: Diagnosis not present

## 2016-04-19 DIAGNOSIS — I25810 Atherosclerosis of coronary artery bypass graft(s) without angina pectoris: Secondary | ICD-10-CM

## 2016-04-19 NOTE — Patient Instructions (Addendum)
For your diabetes continue current medications and will put in future a1c to be done on Monday or Tuesday. Then determine if changes need to be made.  For your hyperlipidemia continue your statin and will get lipid panel check on next week as well. Make changes if necessary afterwards.  Reviewed recent labs done by Dr. Marin Olp. Follow up with him next week.  Will review your labs next week and appointment date to be determined after lab review.

## 2016-04-19 NOTE — Progress Notes (Signed)
Subjective:    Patient ID: Richard [name], male    DOB: 1940-08-18, 75 y.o.   MRN: [id number]  HPI  Pt in for follow up. In past he had a1c that showed sugar average sugars were 150. Pt states in morning fasting sugar levels 100-105. Pt never checks checks after meals. No hyperglycemic signs or symptoms. He does clarify that lasix makes him urinate more.  Pt takes lyrica for his diabetic neuropathy. He states this works. Pt wonders if maybe lyrica maybe sedating him. But he has been on for 3 years and did not report side effect before. He goes on to explain actually pretty active daily walking/playing golf. But when he sits for long periods can nap easily. Pt has seen Dr .Marin Olp yesterday. Pt will get call from Dr. Marin Olp for feraheme. I told him to expect a call.  Pt has some high cholesterol in the past. He is not fasting.    Review of Systems  Constitutional: Negative for chills, fatigue and fever.  Respiratory: Negative for cough, chest tightness and wheezing.   Cardiovascular: Negative for chest pain and palpitations.  Gastrointestinal: Negative for abdominal pain.  Musculoskeletal: Negative for back pain.  Skin: Negative for rash.  Neurological: Negative for dizziness and headaches.  Hematological: Negative for adenopathy. Does not bruise/bleed easily.  Psychiatric/Behavioral: Negative for behavioral problems and confusion. The patient is not nervous/anxious.     Past Medical History:  Diagnosis Date  . Adenomatous colon polyp 05/01/2015   tubular  . Anemia   . Antritis (stomach)    mild  . Arthritis    "knees, hips, shoulders" (03/15/2014)  . CAD (coronary artery disease)   . Chronic lower back pain   . Diverticulosis    mild, left colon  . Duodenitis    chronic  . Esophageal stricture    X 4  . GERD (gastroesophageal reflux disease)   . Gout   . History of gout   . HNP (herniated nucleus pulposus), lumbar   . HTN (hypertension)   . Migraines    "til age 55  yrs"  . Mixed hyperlipidemia   . OSA on CPAP    with 2L O2 at night  . Peptic stricture of esophagus   . Renal insufficiency   . Skin cancer    "right neck cut off; several frozen off my arms" (03/15/2014)  . Sliding hiatal hernia   . Spinal stenosis   . Type II diabetes mellitus ([place])   . Vitamin B12 deficiency   . Wenckebach block    a. brady down to 30s due to frequent block, metoprolol held in response.     Social History   Social History  . Marital status: Divorced    Spouse name: N/A  . Number of children: 2  . Years of education: N/A   Occupational History  .  Retired   Social History Main Topics  . Smoking status: Never Smoker  . Smokeless tobacco: Never Used  . Alcohol use No     Comment: "last drink was in 2012"( 03/15/2014)  . Drug use: No  . Sexual activity: Not Currently   Other Topics Concern  . Not on file   Social History Narrative   Divorced, lives with a roommate.   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  Past Surgical History:  Procedure Laterality Date  . APPENDECTOMY  ~ 1952  . BACK SURGERY    . COLONOSCOPY W/ BIOPSIES AND POLYPECTOMY  2013  . CORONARY ANGIOPLASTY  1993  . CORONARY ANGIOPLASTY WITH STENT PLACEMENT  05/1997   "1"  . CORONARY ARTERY BYPASS GRAFT  03/2000   "CABG X5"  . ESOPHAGEAL DILATION  X 3-4   Dr. Lyla Son; "last one was in the 1990's"  . ESOPHAGOGASTRODUODENOSCOPY     multiple  . FLEXIBLE SIGMOIDOSCOPY     multiple  . KNEE ARTHROSCOPY Left 2011   meniscus repair  . LEFT HEART CATHETERIZATION WITH CORONARY/GRAFT ANGIOGRAM N/A 03/16/2014   Procedure: LEFT HEART CATHETERIZATION WITH Beatrix Fetters;  Surgeon: Burnell Blanks, MD;  Location: [facility] CATH LAB;  Service: Cardiovascular;  Laterality: N/A;  . LUMBAR  LAMINECTOMY/DECOMPRESSION MICRODISCECTOMY Right 06/17/2013   Procedure: LUMBAR LAMINECTOMY MICRODISCECTOMY L4-L5 RIGHT EXCISION OF SYNOVIAL CYST RIGHT   (1 LEVEL) RIGHT PARTIAL FACETECTOMY;  Surgeon: Tobi Bastos, MD;  Location: [facility] ORS;  Service: Orthopedics;  Laterality: Right;  . MYELOGRAM  04/06/13   lumbar, Dr Gladstone Lighter  . PANENDOSCOPY    . SHOULDER SURGERY Right 08/2010   screws placed; "tendons tore off"  . SKIN CANCER EXCISION Right    "neck"  . TONSILLECTOMY  ~ 1954  . UPPER GI ENDOSCOPY  2013   Gastritis; Dr Carlean Purl  . VASECTOMY      Family History  Problem Relation Age of Onset  . Stroke Father   . Hypertension Father   . Pancreatic cancer Mother   . Diabetes Maternal Grandmother   . Stroke Maternal Grandmother   . Heart attack Paternal Grandmother   . Colon cancer Neg Hx   . Esophageal cancer Neg Hx   . Rectal cancer Neg Hx   . Stomach cancer Neg Hx   . Ulcers Neg Hx     Allergies  Allergen Reactions  . Benazepril     angioedema; he is not a candidate for any angiotensin receptor blockers because of this significant allergic reaction. Because of a history of documented adverse serious drug reaction;Medi Alert bracelet  is recommended  . Hctz [Hydrochlorothiazide] Swelling    Tongue and lip swelling   . Lactose Intolerance (Gi)   . Aspirin Other (See Comments)    Gastritis, cant take 325 Mg aspirin     Current Outpatient Prescriptions on File Prior to Visit  Medication Sig Dispense Refill  . allopurinol (ZYLOPRIM) 300 MG tablet Take 450 mg by mouth daily.     [name] amLODipine (NORVASC) 5 MG tablet TAKE 2 TABLETS (10 MG TOTAL) BY MOUTH DAILY. 60 tablet 11  . Ascorbic Acid (VITAMIN C) 500 MG tablet Take 500 mg by mouth every morning.     [name] aspirin 81 MG tablet Take 81 mg by mouth daily.    [name] atorvastatin (LIPITOR) 80 MG tablet TAKE ONE TABLET BY MOUTH AT BEDTIME 90 tablet 3  . esomeprazole (NEXIUM) 40 MG capsule Take 1 capsule (40 mg total) by mouth daily. 30  capsule 5  . fenofibrate 160 MG tablet TAKE 1 TABLET BY MOUTH EVERY DAY 30 tablet 5  . fluticasone (FLONASE) 50 MCG/ACT nasal spray INHALE ONE SPRAY INTO EACH NOSTRIL TWICE DAILY. 1 g 6  . FREESTYLE LITE test strip TEST ONE TIME DAILY 100 each 3  . furosemide (LASIX) 40 MG tablet Take 2 tablets (80 mg total) by mouth 2 (two) times daily. 360 tablet 3  . glimepiride (AMARYL)  2 MG tablet TAKE 1 TABLET (2 MG TOTAL) BY MOUTH DAILY WITH BREAKFAST. 90 tablet 1  . Lancets (FREESTYLE) lancets TEST ONE TIME DAILY 100 each 3  . metFORMIN (GLUCOPHAGE) 1000 MG tablet TAKE ONE AND ONE-HALF TABLETS BY MOUTH EVERY MORNING AND 1 TALBET IN THE EVENING. 225 tablet 1  . metoprolol tartrate (LOPRESSOR) 25 MG tablet TAKE HALF TABLET BY MOUTH TWICE DAILY 30 tablet 9  . montelukast (SINGULAIR) 10 MG tablet Take 1 tablet (10 mg total) by mouth at bedtime. 90 tablet 1  . nitroGLYCERIN (NITROSTAT) 0.4 MG SL tablet Place 0.4 mg under the tongue every 5 (five) minutes as needed for chest pain.    . potassium chloride SA (K-DUR,KLOR-CON) 20 MEQ tablet TAKE ONE TABLET BY MOUTH TWICE DAILY 60 tablet 11  . pregabalin (LYRICA) 75 MG capsule Take 75 mg by mouth 2 (two) times daily.    . psyllium (HYDROCIL/METAMUCIL) 95 % PACK Take 1 packet by mouth 2 (two) times daily.    [name] ZETIA 10 MG tablet TAKE ONE TABLET BY MOUTH DAILY 30 tablet 8  . ranitidine (ZANTAC) 150 MG capsule Take 1 capsule (150 mg total) by mouth 2 (two) times daily. (Patient not taking: Reported on 04/19/2016) 60 capsule 0   No current facility-administered medications on file prior to visit.     BP 131/60 (BP Location: Left Arm, Patient Position: Sitting, Cuff Size: Large)   Pulse 67   Temp 97.9 F (36.6 C) (Oral)   Resp 16   Ht 6' (1.829 m)   Wt 260 lb 9.6 oz (118.2 kg)   SpO2 94%   BMI 35.34 kg/m       Objective:   Physical Exam  General Mental Status- Alert. General Appearance- Not in acute distress.   Skin General: Color- Normal Color.  Moisture- Normal Moisture.  Neck Carotid Arteries- Normal color. Moisture- Normal Moisture. No carotid bruits. No JVD.  Chest and Lung Exam Auscultation: Breath Sounds:-Normal.  Cardiovascular Auscultation:Rythm- Regular. Murmurs & Other Heart Sounds:Auscultation of the heart reveals- on ausculation II/VI murmur heard.(reviewed past echo done for murmur in 2015)  Abdomen Inspection:-Inspeection Normal. Palpation/Percussion:Note:No mass. Palpation and Percussion of the abdomen reveal- Non Tender, Non Distended + BS, no rebound or guarding.  Neurologic Cranial Nerve exam:- CN III-XII intact(No nystagmus), symmetric smile. Strength:- 5/5 equal and symmetric strength both upper and lower extremities.      Assessment & Plan:  For your diabetes continue current medications and will put in future a1c to be done on Monday or Tuesday. Then determine if changes need to be made.  For your hyperlipidemia continue your statin and will get lipid panel check on next week as well. Make changes if necessary afterwards.  Reviewed recent labs done by Dr. Marin Olp. Follow up with him next week.(he states feels mild sedated recently at times. Will see with iron infusion if this resolves)  Will review your labs next week and appointment date to be determined after lab review.  Alphonse Asbridge, Percell Miller, PA-C

## 2016-04-19 NOTE — Progress Notes (Signed)
Pre visit review using our clinic review tool, if applicable. No additional management support is needed unless otherwise documented below in the visit note. 

## 2016-04-23 ENCOUNTER — Other Ambulatory Visit (INDEPENDENT_AMBULATORY_CARE_PROVIDER_SITE_OTHER): Payer: Medicare Other

## 2016-04-23 ENCOUNTER — Other Ambulatory Visit: Payer: Self-pay | Admitting: Cardiology

## 2016-04-23 ENCOUNTER — Other Ambulatory Visit: Payer: Self-pay | Admitting: Family Medicine

## 2016-04-23 DIAGNOSIS — E669 Obesity, unspecified: Secondary | ICD-10-CM

## 2016-04-23 DIAGNOSIS — E1169 Type 2 diabetes mellitus with other specified complication: Secondary | ICD-10-CM

## 2016-04-23 DIAGNOSIS — E785 Hyperlipidemia, unspecified: Secondary | ICD-10-CM | POA: Diagnosis not present

## 2016-04-23 DIAGNOSIS — I6523 Occlusion and stenosis of bilateral carotid arteries: Secondary | ICD-10-CM

## 2016-04-23 LAB — LIPID PANEL
CHOL/HDL RATIO: 4
Cholesterol: 133 mg/dL (ref 0–200)
HDL: 36.4 mg/dL — AB (ref >39.00)
LDL CALC: 71 mg/dL (ref 0–99)
NONHDL: 96.31
TRIGLYCERIDES: 125 mg/dL (ref 0.0–149.0)
VLDL: 25 mg/dL (ref 0.0–40.0)

## 2016-04-23 LAB — HEMOGLOBIN A1C: Hgb A1c MFr Bld: 6.8 % — ABNORMAL HIGH (ref 4.6–6.5)

## 2016-04-25 ENCOUNTER — Ambulatory Visit (HOSPITAL_BASED_OUTPATIENT_CLINIC_OR_DEPARTMENT_OTHER): Payer: Medicare Other

## 2016-04-25 ENCOUNTER — Other Ambulatory Visit: Payer: Self-pay | Admitting: Family

## 2016-04-25 VITALS — BP 128/60 | HR 63 | Temp 97.6°F | Resp 16

## 2016-04-25 DIAGNOSIS — D508 Other iron deficiency anemias: Secondary | ICD-10-CM

## 2016-04-25 DIAGNOSIS — D519 Vitamin B12 deficiency anemia, unspecified: Secondary | ICD-10-CM

## 2016-04-25 DIAGNOSIS — K922 Gastrointestinal hemorrhage, unspecified: Secondary | ICD-10-CM | POA: Diagnosis not present

## 2016-04-25 DIAGNOSIS — K909 Intestinal malabsorption, unspecified: Secondary | ICD-10-CM

## 2016-04-25 MED ORDER — SODIUM CHLORIDE 0.9 % IV SOLN
Freq: Once | INTRAVENOUS | Status: AC
  Administered 2016-04-25: 15:00:00 via INTRAVENOUS

## 2016-04-25 MED ORDER — SODIUM CHLORIDE 0.9 % IV SOLN
510.0000 mg | Freq: Once | INTRAVENOUS | Status: AC
  Administered 2016-04-25: 510 mg via INTRAVENOUS
  Filled 2016-04-25: qty 17

## 2016-04-25 NOTE — Patient Instructions (Signed)

## 2016-05-03 ENCOUNTER — Ambulatory Visit (HOSPITAL_BASED_OUTPATIENT_CLINIC_OR_DEPARTMENT_OTHER): Payer: Medicare Other

## 2016-05-03 VITALS — BP 128/61 | HR 72 | Temp 98.3°F | Resp 18

## 2016-05-03 DIAGNOSIS — D519 Vitamin B12 deficiency anemia, unspecified: Secondary | ICD-10-CM

## 2016-05-03 DIAGNOSIS — K922 Gastrointestinal hemorrhage, unspecified: Secondary | ICD-10-CM

## 2016-05-03 DIAGNOSIS — K909 Intestinal malabsorption, unspecified: Secondary | ICD-10-CM

## 2016-05-03 DIAGNOSIS — D508 Other iron deficiency anemias: Secondary | ICD-10-CM | POA: Diagnosis present

## 2016-05-03 MED ORDER — SODIUM CHLORIDE 0.9 % IV SOLN
Freq: Once | INTRAVENOUS | Status: AC
  Administered 2016-05-03: 14:00:00 via INTRAVENOUS

## 2016-05-03 MED ORDER — SODIUM CHLORIDE 0.9 % IV SOLN
510.0000 mg | Freq: Once | INTRAVENOUS | Status: AC
  Administered 2016-05-03: 510 mg via INTRAVENOUS
  Filled 2016-05-03: qty 17

## 2016-05-03 NOTE — Patient Instructions (Signed)

## 2016-05-12 ENCOUNTER — Other Ambulatory Visit: Payer: Self-pay | Admitting: Family Medicine

## 2016-05-13 DIAGNOSIS — M150 Primary generalized (osteo)arthritis: Secondary | ICD-10-CM | POA: Diagnosis not present

## 2016-05-13 DIAGNOSIS — M1A09X0 Idiopathic chronic gout, multiple sites, without tophus (tophi): Secondary | ICD-10-CM | POA: Diagnosis not present

## 2016-05-13 DIAGNOSIS — G5793 Unspecified mononeuropathy of bilateral lower limbs: Secondary | ICD-10-CM | POA: Diagnosis not present

## 2016-05-14 ENCOUNTER — Other Ambulatory Visit: Payer: Self-pay | Admitting: *Deleted

## 2016-05-15 ENCOUNTER — Ambulatory Visit (HOSPITAL_BASED_OUTPATIENT_CLINIC_OR_DEPARTMENT_OTHER): Payer: Medicare Other

## 2016-05-15 ENCOUNTER — Other Ambulatory Visit: Payer: Medicare Other

## 2016-05-15 VITALS — BP 110/57 | HR 76 | Temp 98.0°F | Resp 18

## 2016-05-15 DIAGNOSIS — D510 Vitamin B12 deficiency anemia due to intrinsic factor deficiency: Secondary | ICD-10-CM | POA: Diagnosis present

## 2016-05-15 DIAGNOSIS — D519 Vitamin B12 deficiency anemia, unspecified: Secondary | ICD-10-CM

## 2016-05-15 DIAGNOSIS — D508 Other iron deficiency anemias: Secondary | ICD-10-CM

## 2016-05-15 MED ORDER — CYANOCOBALAMIN 1000 MCG/ML IJ SOLN
1000.0000 ug | Freq: Once | INTRAMUSCULAR | Status: AC
  Administered 2016-05-15: 1000 ug via INTRAMUSCULAR

## 2016-05-15 MED ORDER — CYANOCOBALAMIN 1000 MCG/ML IJ SOLN
INTRAMUSCULAR | Status: AC
  Filled 2016-05-15: qty 1

## 2016-05-15 NOTE — Patient Instructions (Signed)
Cyanocobalamin, Vitamin B12 injection What is this medicine? CYANOCOBALAMIN (sye an oh koe BAL a min) is a man made form of vitamin B12. Vitamin B12 is used in the growth of healthy blood cells, nerve cells, and proteins in the body. It also helps with the metabolism of fats and carbohydrates. This medicine is used to treat people who can not absorb vitamin B12. This medicine may be used for other purposes; ask your health care provider or pharmacist if you have questions. COMMON BRAND NAME(S): B-12 Compliance Kit, B-12 Injection Kit, Cyomin, LA-12, Nutri-Twelve, Physicians EZ Use B-12, Primabalt What should I tell my health care provider before I take this medicine? They need to know if you have any of these conditions: -kidney disease -Leber's disease -megaloblastic anemia -an unusual or allergic reaction to cyanocobalamin, cobalt, other medicines, foods, dyes, or preservatives -pregnant or trying to get pregnant -breast-feeding How should I use this medicine? This medicine is injected into a muscle or deeply under the skin. It is usually given by a health care professional in a clinic or doctor's office. However, your doctor may teach you how to inject yourself. Follow all instructions. Talk to your pediatrician regarding the use of this medicine in children. Special care may be needed. Overdosage: If you think you have taken too much of this medicine contact a poison control center or emergency room at once. NOTE: This medicine is only for you. Do not share this medicine with others. What if I miss a dose? If you are given your dose at a clinic or doctor's office, call to reschedule your appointment. If you give your own injections and you miss a dose, take it as soon as you can. If it is almost time for your next dose, take only that dose. Do not take double or extra doses. What may interact with this medicine? -colchicine -heavy alcohol intake This list may not describe all possible  interactions. Give your health care provider a list of all the medicines, herbs, non-prescription drugs, or dietary supplements you use. Also tell them if you smoke, drink alcohol, or use illegal drugs. Some items may interact with your medicine. What should I watch for while using this medicine? Visit your doctor or health care professional regularly. You may need blood work done while you are taking this medicine. You may need to follow a special diet. Talk to your doctor. Limit your alcohol intake and avoid smoking to get the best benefit. What side effects may I notice from receiving this medicine? Side effects that you should report to your doctor or health care professional as soon as possible: -allergic reactions like skin rash, itching or hives, swelling of the face, lips, or tongue -blue tint to skin -chest tightness, pain -difficulty breathing, wheezing -dizziness -red, swollen painful area on the leg Side effects that usually do not require medical attention (report to your doctor or health care professional if they continue or are bothersome): -diarrhea -headache This list may not describe all possible side effects. Call your doctor for medical advice about side effects. You may report side effects to FDA at 1-800-FDA-1088. Where should I keep my medicine? Keep out of the reach of children. Store at room temperature between 15 and 30 degrees C (59 and 85 degrees F). Protect from light. Throw away any unused medicine after the expiration date. NOTE: This sheet is a summary. It may not cover all possible information. If you have questions about this medicine, talk to your doctor, pharmacist, or   health care provider.  2017 Elsevier/Gold Standard (2007-09-28 22:10:20)  

## 2016-05-20 ENCOUNTER — Ambulatory Visit (HOSPITAL_COMMUNITY)
Admission: RE | Admit: 2016-05-20 | Discharge: 2016-05-20 | Disposition: A | Discharge status: Home / Self Care | Payer: Medicare Other | Source: Ambulatory Visit | Attending: Cardiology | Admitting: Cardiology

## 2016-05-20 DIAGNOSIS — I6523 Occlusion and stenosis of bilateral carotid arteries: Secondary | ICD-10-CM | POA: Insufficient documentation

## 2016-05-20 DIAGNOSIS — I10 Essential (primary) hypertension: Secondary | ICD-10-CM | POA: Insufficient documentation

## 2016-05-20 DIAGNOSIS — I2510 Atherosclerotic heart disease of native coronary artery without angina pectoris: Secondary | ICD-10-CM | POA: Insufficient documentation

## 2016-05-20 DIAGNOSIS — E785 Hyperlipidemia, unspecified: Secondary | ICD-10-CM | POA: Diagnosis not present

## 2016-05-20 DIAGNOSIS — E119 Type 2 diabetes mellitus without complications: Secondary | ICD-10-CM | POA: Insufficient documentation

## 2016-05-31 ENCOUNTER — Other Ambulatory Visit: Payer: Self-pay | Admitting: Family Medicine

## 2016-05-31 NOTE — Telephone Encounter (Signed)
Refill sent per [facility] refill protocol/SLS  

## 2016-06-12 ENCOUNTER — Other Ambulatory Visit: Payer: Self-pay | Admitting: *Deleted

## 2016-06-12 ENCOUNTER — Ambulatory Visit (HOSPITAL_BASED_OUTPATIENT_CLINIC_OR_DEPARTMENT_OTHER): Payer: Medicare Other

## 2016-06-12 ENCOUNTER — Other Ambulatory Visit (HOSPITAL_BASED_OUTPATIENT_CLINIC_OR_DEPARTMENT_OTHER): Payer: Medicare Other

## 2016-06-12 VITALS — BP 144/43 | HR 44 | Temp 97.7°F | Resp 16

## 2016-06-12 DIAGNOSIS — D510 Vitamin B12 deficiency anemia due to intrinsic factor deficiency: Secondary | ICD-10-CM

## 2016-06-12 DIAGNOSIS — D500 Iron deficiency anemia secondary to blood loss (chronic): Secondary | ICD-10-CM

## 2016-06-12 DIAGNOSIS — D508 Other iron deficiency anemias: Secondary | ICD-10-CM

## 2016-06-12 DIAGNOSIS — D519 Vitamin B12 deficiency anemia, unspecified: Secondary | ICD-10-CM

## 2016-06-12 LAB — CBC WITH DIFFERENTIAL (CANCER CENTER ONLY)
BASO#: 0 10*3/uL (ref 0.0–0.2)
BASO%: 0.4 % (ref 0.0–2.0)
EOS ABS: 0.2 10*3/uL (ref 0.0–0.5)
EOS%: 2.2 % (ref 0.0–7.0)
HCT: 40.6 % (ref 38.7–49.9)
HEMOGLOBIN: 13.3 g/dL (ref 13.0–17.1)
LYMPH#: 1.7 10*3/uL (ref 0.9–3.3)
LYMPH%: 21.4 % (ref 14.0–48.0)
MCH: 27.4 pg — AB (ref 28.0–33.4)
MCHC: 32.8 g/dL (ref 32.0–35.9)
MCV: 84 fL (ref 82–98)
MONO#: 0.9 10*3/uL (ref 0.1–0.9)
MONO%: 11.2 % (ref 0.0–13.0)
NEUT#: 5.1 10*3/uL (ref 1.5–6.5)
NEUT%: 64.8 % (ref 40.0–80.0)
PLATELETS: 245 10*3/uL (ref 145–400)
RBC: 4.86 10*6/uL (ref 4.20–5.70)
RDW: 17.4 % — ABNORMAL HIGH (ref 11.1–15.7)
WBC: 7.9 10*3/uL (ref 4.0–10.0)

## 2016-06-12 MED ORDER — CYANOCOBALAMIN 1000 MCG/ML IJ SOLN
1000.0000 ug | Freq: Once | INTRAMUSCULAR | Status: AC
  Administered 2016-06-12: 1000 ug via INTRAMUSCULAR

## 2016-06-12 MED ORDER — CYANOCOBALAMIN 1000 MCG/ML IJ SOLN
INTRAMUSCULAR | Status: AC
  Filled 2016-06-12: qty 1

## 2016-06-12 NOTE — Patient Instructions (Signed)
Cyanocobalamin, Vitamin B12 injection What is this medicine? CYANOCOBALAMIN (sye an oh koe BAL a min) is a man made form of vitamin B12. Vitamin B12 is used in the growth of healthy blood cells, nerve cells, and proteins in the body. It also helps with the metabolism of fats and carbohydrates. This medicine is used to treat people who can not absorb vitamin B12. This medicine may be used for other purposes; ask your health care provider or pharmacist if you have questions. COMMON BRAND NAME(S): B-12 Compliance Kit, B-12 Injection Kit, Cyomin, LA-12, Nutri-Twelve, Physicians EZ Use B-12, Primabalt What should I tell my health care provider before I take this medicine? They need to know if you have any of these conditions: -kidney disease -Leber's disease -megaloblastic anemia -an unusual or allergic reaction to cyanocobalamin, cobalt, other medicines, foods, dyes, or preservatives -pregnant or trying to get pregnant -breast-feeding How should I use this medicine? This medicine is injected into a muscle or deeply under the skin. It is usually given by a health care professional in a clinic or doctor's office. However, your doctor may teach you how to inject yourself. Follow all instructions. Talk to your pediatrician regarding the use of this medicine in children. Special care may be needed. Overdosage: If you think you have taken too much of this medicine contact a poison control center or emergency room at once. NOTE: This medicine is only for you. Do not share this medicine with others. What if I miss a dose? If you are given your dose at a clinic or doctor's office, call to reschedule your appointment. If you give your own injections and you miss a dose, take it as soon as you can. If it is almost time for your next dose, take only that dose. Do not take double or extra doses. What may interact with this medicine? -colchicine -heavy alcohol intake This list may not describe all possible  interactions. Give your health care provider a list of all the medicines, herbs, non-prescription drugs, or dietary supplements you use. Also tell them if you smoke, drink alcohol, or use illegal drugs. Some items may interact with your medicine. What should I watch for while using this medicine? Visit your doctor or health care professional regularly. You may need blood work done while you are taking this medicine. You may need to follow a special diet. Talk to your doctor. Limit your alcohol intake and avoid smoking to get the best benefit. What side effects may I notice from receiving this medicine? Side effects that you should report to your doctor or health care professional as soon as possible: -allergic reactions like skin rash, itching or hives, swelling of the face, lips, or tongue -blue tint to skin -chest tightness, pain -difficulty breathing, wheezing -dizziness -red, swollen painful area on the leg Side effects that usually do not require medical attention (report to your doctor or health care professional if they continue or are bothersome): -diarrhea -headache This list may not describe all possible side effects. Call your doctor for medical advice about side effects. You may report side effects to FDA at 1-800-FDA-1088. Where should I keep my medicine? Keep out of the reach of children. Store at room temperature between 15 and 30 degrees C (59 and 85 degrees F). Protect from light. Throw away any unused medicine after the expiration date. NOTE: This sheet is a summary. It may not cover all possible information. If you have questions about this medicine, talk to your doctor, pharmacist, or   health care provider.  2017 Elsevier/Gold Standard (2007-09-28 22:10:20)  

## 2016-06-27 ENCOUNTER — Encounter: Payer: Self-pay | Admitting: Family Medicine

## 2016-06-27 ENCOUNTER — Ambulatory Visit (INDEPENDENT_AMBULATORY_CARE_PROVIDER_SITE_OTHER): Payer: Medicare Other | Admitting: Family Medicine

## 2016-06-27 VITALS — BP 124/60 | HR 63 | Temp 98.1°F | Ht 70.0 in | Wt 272.0 lb

## 2016-06-27 DIAGNOSIS — I6523 Occlusion and stenosis of bilateral carotid arteries: Secondary | ICD-10-CM | POA: Diagnosis not present

## 2016-06-27 DIAGNOSIS — J309 Allergic rhinitis, unspecified: Secondary | ICD-10-CM

## 2016-06-27 MED ORDER — BENZONATATE 100 MG PO CAPS: 100.0000 mg | ORAL_CAPSULE | Freq: Three times a day (TID) | ORAL | 0 refills | Status: DC | PRN

## 2016-06-27 NOTE — Progress Notes (Signed)
Pre visit review using our clinic review tool, if applicable. No additional management support is needed unless otherwise documented below in the visit note. 

## 2016-06-27 NOTE — Progress Notes (Addendum)
Chief Complaint  Patient presents with  . Nasal Congestion    Pt reports cough with phlem runny nose runny nose and watery eyes    Richard [name] here for URI complaints.  Duration: 3 days  Associated symptoms: sinus headache, sinus congestion, rhinorrhea, itchy watery eyes and sore throat Denies: subjective fever and shortness of breath Treatment to date: Tylenol Sick contacts: No  ROS:  Const: Denies fevers HEENT: As noted in HPI Lungs: No SOB  Past Medical History:  Diagnosis Date  . Adenomatous colon polyp 05/01/2015   tubular  . Anemia   . Antritis (stomach)    mild  . Arthritis    "knees, hips, shoulders" (03/15/2014)  . CAD (coronary artery disease)   . Chronic lower back pain   . Diverticulosis    mild, left colon  . Duodenitis    chronic  . Esophageal stricture    X 4  . GERD (gastroesophageal reflux disease)   . Gout   . History of gout   . HNP (herniated nucleus pulposus), lumbar   . HTN (hypertension)   . Migraines    "til age 47 yrs"  . Mixed hyperlipidemia   . OSA on CPAP    with 2L O2 at night  . Peptic stricture of esophagus   . Renal insufficiency   . Skin cancer    "right neck cut off; several frozen off my arms" (03/15/2014)  . Sliding hiatal hernia   . Spinal stenosis   . Type II diabetes mellitus ([place])   . Vitamin B12 deficiency   . Wenckebach block    a. brady down to 30s due to frequent block, metoprolol held in response.   Family History  Problem Relation Age of Onset  . Stroke Father   . Hypertension Father   . Pancreatic cancer Mother   . Diabetes Maternal Grandmother   . Stroke Maternal Grandmother   . Heart attack Paternal Grandmother   . Colon cancer Neg Hx   . Esophageal cancer Neg Hx   . Rectal cancer Neg Hx   . Stomach cancer Neg Hx   . Ulcers Neg Hx     BP 124/60 (BP Location: Left Arm, Patient Position: Sitting, Cuff Size: Large)   Pulse 63   Temp 98.1 F (36.7 C) (Oral)   Ht 5\' 10"  (1.778 m)   Wt 272 lb  (123.4 kg)   SpO2 98%   BMI 39.03 kg/m  General: Awake, alert, appears stated age HEENT: AT, [place], ears patent b/l and TM's neg, nares patent w/o discharge, mild TTP over the L max sinus, pharynx pink and without exudates, MMM, sclera b/l injected Neck: No masses or asymmetry Heart: RRR, 3/6 SEM heard loudest at aortic listening post, no thrill, no bruits Lungs: CTAB, no accessory muscle use Psych: Age appropriate judgment and insight, normal mood and affect  Acute allergic rhinitis, unspecified seasonality, unspecified trigger - Plan: benzonatate (TESSALON) 100 MG capsule  Orders as above. Recommended continued use of INCS and PO antihistamine. F/u in 1 week if symptoms worsen or fail to improve. Pt voiced understanding and agreement to the plan.  [place], DO 06/27/16 9:03 AM

## 2016-06-27 NOTE — Patient Instructions (Signed)
Claritin (loratadine), Allegra (fexofenadine), Zyrtec (cetirizine); these are listed in order from weakest to strongest. Generic, and therefore cheaper, options are in the parentheses.   Flonase (fluticasone); nasal spray that is over the counter. 2 sprays each nostril, once daily. Aim towards the same side eye when you spray.  There are available OTC, and the generic versions, which may be cheaper, are in parentheses. Show this to a pharmacist if you have trouble finding any of these items.

## 2016-07-10 ENCOUNTER — Other Ambulatory Visit (HOSPITAL_BASED_OUTPATIENT_CLINIC_OR_DEPARTMENT_OTHER): Payer: Medicare Other

## 2016-07-10 ENCOUNTER — Ambulatory Visit (HOSPITAL_BASED_OUTPATIENT_CLINIC_OR_DEPARTMENT_OTHER): Payer: Medicare Other

## 2016-07-10 VITALS — BP 120/49 | HR 45 | Temp 97.7°F | Resp 18

## 2016-07-10 DIAGNOSIS — D519 Vitamin B12 deficiency anemia, unspecified: Secondary | ICD-10-CM

## 2016-07-10 DIAGNOSIS — D510 Vitamin B12 deficiency anemia due to intrinsic factor deficiency: Secondary | ICD-10-CM

## 2016-07-10 DIAGNOSIS — D508 Other iron deficiency anemias: Secondary | ICD-10-CM

## 2016-07-10 DIAGNOSIS — D500 Iron deficiency anemia secondary to blood loss (chronic): Secondary | ICD-10-CM

## 2016-07-10 LAB — CBC WITH DIFFERENTIAL (CANCER CENTER ONLY)
BASO#: 0 10*3/uL (ref 0.0–0.2)
BASO%: 0.5 % (ref 0.0–2.0)
EOS%: 5.2 % (ref 0.0–7.0)
Eosinophils Absolute: 0.5 10*3/uL (ref 0.0–0.5)
HCT: 41.6 % (ref 38.7–49.9)
HEMOGLOBIN: 13.7 g/dL (ref 13.0–17.1)
LYMPH#: 1.9 10*3/uL (ref 0.9–3.3)
LYMPH%: 22.2 % (ref 14.0–48.0)
MCH: 27.6 pg — ABNORMAL LOW (ref 28.0–33.4)
MCHC: 32.9 g/dL (ref 32.0–35.9)
MCV: 84 fL (ref 82–98)
MONO#: 0.7 10*3/uL (ref 0.1–0.9)
MONO%: 8.6 % (ref 0.0–13.0)
NEUT#: 5.5 10*3/uL (ref 1.5–6.5)
NEUT%: 63.5 % (ref 40.0–80.0)
Platelets: 216 10*3/uL (ref 145–400)
RBC: 4.96 10*6/uL (ref 4.20–5.70)
RDW: 17.6 % — AB (ref 11.1–15.7)
WBC: 8.6 10*3/uL (ref 4.0–10.0)

## 2016-07-10 MED ORDER — CYANOCOBALAMIN 1000 MCG/ML IJ SOLN
1000.0000 ug | Freq: Once | INTRAMUSCULAR | Status: AC
  Administered 2016-07-10: 1000 ug via INTRAMUSCULAR

## 2016-07-10 MED ORDER — CYANOCOBALAMIN 1000 MCG/ML IJ SOLN
INTRAMUSCULAR | Status: AC
Start: 2016-07-10 — End: 2016-07-10
  Filled 2016-07-10: qty 1

## 2016-07-10 NOTE — Patient Instructions (Signed)
Cyanocobalamin, Vitamin B12 injection What is this medicine? CYANOCOBALAMIN (sye an oh koe BAL a min) is a man made form of vitamin B12. Vitamin B12 is used in the growth of healthy blood cells, nerve cells, and proteins in the body. It also helps with the metabolism of fats and carbohydrates. This medicine is used to treat people who can not absorb vitamin B12. This medicine may be used for other purposes; ask your health care provider or pharmacist if you have questions. COMMON BRAND NAME(S): B-12 Compliance Kit, B-12 Injection Kit, Cyomin, LA-12, Nutri-Twelve, Physicians EZ Use B-12, Primabalt What should I tell my health care provider before I take this medicine? They need to know if you have any of these conditions: -kidney disease -Leber's disease -megaloblastic anemia -an unusual or allergic reaction to cyanocobalamin, cobalt, other medicines, foods, dyes, or preservatives -pregnant or trying to get pregnant -breast-feeding How should I use this medicine? This medicine is injected into a muscle or deeply under the skin. It is usually given by a health care professional in a clinic or doctor's office. However, your doctor may teach you how to inject yourself. Follow all instructions. Talk to your pediatrician regarding the use of this medicine in children. Special care may be needed. Overdosage: If you think you have taken too much of this medicine contact a poison control center or emergency room at once. NOTE: This medicine is only for you. Do not share this medicine with others. What if I miss a dose? If you are given your dose at a clinic or doctor's office, call to reschedule your appointment. If you give your own injections and you miss a dose, take it as soon as you can. If it is almost time for your next dose, take only that dose. Do not take double or extra doses. What may interact with this medicine? -colchicine -heavy alcohol intake This list may not describe all possible  interactions. Give your health care provider a list of all the medicines, herbs, non-prescription drugs, or dietary supplements you use. Also tell them if you smoke, drink alcohol, or use illegal drugs. Some items may interact with your medicine. What should I watch for while using this medicine? Visit your doctor or health care professional regularly. You may need blood work done while you are taking this medicine. You may need to follow a special diet. Talk to your doctor. Limit your alcohol intake and avoid smoking to get the best benefit. What side effects may I notice from receiving this medicine? Side effects that you should report to your doctor or health care professional as soon as possible: -allergic reactions like skin rash, itching or hives, swelling of the face, lips, or tongue -blue tint to skin -chest tightness, pain -difficulty breathing, wheezing -dizziness -red, swollen painful area on the leg Side effects that usually do not require medical attention (report to your doctor or health care professional if they continue or are bothersome): -diarrhea -headache This list may not describe all possible side effects. Call your doctor for medical advice about side effects. You may report side effects to FDA at 1-800-FDA-1088. Where should I keep my medicine? Keep out of the reach of children. Store at room temperature between 15 and 30 degrees C (59 and 85 degrees F). Protect from light. Throw away any unused medicine after the expiration date. NOTE: This sheet is a summary. It may not cover all possible information. If you have questions about this medicine, talk to your doctor, pharmacist, or   health care provider.  2017 Elsevier/Gold Standard (2007-09-28 22:10:20)  

## 2016-07-11 ENCOUNTER — Telehealth: Payer: Self-pay | Admitting: *Deleted

## 2016-07-11 LAB — RETICULOCYTES: RETICULOCYTE COUNT: 1.2 % (ref 0.6–2.6)

## 2016-07-11 LAB — IRON AND TIBC
%SAT: 20 % (ref 20–55)
IRON: 60 ug/dL (ref 42–163)
TIBC: 305 ug/dL (ref 202–409)
UIBC: 245 ug/dL (ref 117–376)

## 2016-07-11 LAB — VITAMIN B12: Vitamin B12: 330 pg/mL (ref 232–1245)

## 2016-07-11 LAB — FERRITIN: FERRITIN: 337 ng/mL — AB (ref 22–316)

## 2016-07-11 NOTE — Telephone Encounter (Addendum)
Patient aware of results  ----- Message from [name], MD sent at 07/11/2016  1:37 PM EST ----- Call - iron level is ok!!! Richard [name]

## 2016-07-11 NOTE — Telephone Encounter (Signed)
duplicate

## 2016-07-24 DIAGNOSIS — L812 Freckles: Secondary | ICD-10-CM | POA: Diagnosis not present

## 2016-07-24 DIAGNOSIS — L718 Other rosacea: Secondary | ICD-10-CM | POA: Diagnosis not present

## 2016-07-24 DIAGNOSIS — L570 Actinic keratosis: Secondary | ICD-10-CM | POA: Diagnosis not present

## 2016-07-24 DIAGNOSIS — L578 Other skin changes due to chronic exposure to nonionizing radiation: Secondary | ICD-10-CM | POA: Diagnosis not present

## 2016-07-24 DIAGNOSIS — Z85828 Personal history of other malignant neoplasm of skin: Secondary | ICD-10-CM | POA: Diagnosis not present

## 2016-07-24 DIAGNOSIS — L821 Other seborrheic keratosis: Secondary | ICD-10-CM | POA: Diagnosis not present

## 2016-07-24 DIAGNOSIS — L820 Inflamed seborrheic keratosis: Secondary | ICD-10-CM | POA: Diagnosis not present

## 2016-07-24 DIAGNOSIS — L918 Other hypertrophic disorders of the skin: Secondary | ICD-10-CM | POA: Diagnosis not present

## 2016-07-24 DIAGNOSIS — D1800 Hemangioma unspecified site: Secondary | ICD-10-CM | POA: Diagnosis not present

## 2016-07-24 DIAGNOSIS — D229 Melanocytic nevi, unspecified: Secondary | ICD-10-CM | POA: Diagnosis not present

## 2016-07-24 DIAGNOSIS — Z1283 Encounter for screening for malignant neoplasm of skin: Secondary | ICD-10-CM | POA: Diagnosis not present

## 2016-08-01 ENCOUNTER — Other Ambulatory Visit: Payer: Self-pay | Admitting: Family Medicine

## 2016-08-06 ENCOUNTER — Other Ambulatory Visit: Payer: Self-pay | Admitting: *Deleted

## 2016-08-06 DIAGNOSIS — D510 Vitamin B12 deficiency anemia due to intrinsic factor deficiency: Secondary | ICD-10-CM

## 2016-08-06 DIAGNOSIS — D500 Iron deficiency anemia secondary to blood loss (chronic): Secondary | ICD-10-CM

## 2016-08-07 ENCOUNTER — Other Ambulatory Visit (HOSPITAL_BASED_OUTPATIENT_CLINIC_OR_DEPARTMENT_OTHER): Payer: Medicare Other

## 2016-08-07 ENCOUNTER — Ambulatory Visit (HOSPITAL_BASED_OUTPATIENT_CLINIC_OR_DEPARTMENT_OTHER): Payer: Medicare Other

## 2016-08-07 VITALS — BP 127/48 | HR 57 | Temp 97.9°F | Resp 18

## 2016-08-07 DIAGNOSIS — D510 Vitamin B12 deficiency anemia due to intrinsic factor deficiency: Secondary | ICD-10-CM | POA: Diagnosis present

## 2016-08-07 DIAGNOSIS — D508 Other iron deficiency anemias: Secondary | ICD-10-CM

## 2016-08-07 DIAGNOSIS — D519 Vitamin B12 deficiency anemia, unspecified: Secondary | ICD-10-CM

## 2016-08-07 DIAGNOSIS — D500 Iron deficiency anemia secondary to blood loss (chronic): Secondary | ICD-10-CM

## 2016-08-07 LAB — CBC WITH DIFFERENTIAL (CANCER CENTER ONLY)
BASO#: 0 10*3/uL (ref 0.0–0.2)
BASO%: 0.2 % (ref 0.0–2.0)
EOS%: 4.2 % (ref 0.0–7.0)
Eosinophils Absolute: 0.4 10*3/uL (ref 0.0–0.5)
HCT: 42.8 % (ref 38.7–49.9)
HGB: 13.9 g/dL (ref 13.0–17.1)
LYMPH#: 2.2 10*3/uL (ref 0.9–3.3)
LYMPH%: 24 % (ref 14.0–48.0)
MCH: 28.3 pg (ref 28.0–33.4)
MCHC: 32.5 g/dL (ref 32.0–35.9)
MCV: 87 fL (ref 82–98)
MONO#: 0.7 10*3/uL (ref 0.1–0.9)
MONO%: 7.9 % (ref 0.0–13.0)
NEUT#: 5.8 10*3/uL (ref 1.5–6.5)
NEUT%: 63.7 % (ref 40.0–80.0)
PLATELETS: 216 10*3/uL (ref 145–400)
RBC: 4.91 10*6/uL (ref 4.20–5.70)
RDW: 16.9 % — AB (ref 11.1–15.7)
WBC: 9.1 10*3/uL (ref 4.0–10.0)

## 2016-08-07 MED ORDER — CYANOCOBALAMIN 1000 MCG/ML IJ SOLN
1000.0000 ug | Freq: Once | INTRAMUSCULAR | Status: AC
  Administered 2016-08-07: 1000 ug via INTRAMUSCULAR

## 2016-08-07 MED ORDER — CYANOCOBALAMIN 1000 MCG/ML IJ SOLN
INTRAMUSCULAR | Status: AC
  Filled 2016-08-07: qty 1

## 2016-08-14 DIAGNOSIS — L578 Other skin changes due to chronic exposure to nonionizing radiation: Secondary | ICD-10-CM | POA: Diagnosis not present

## 2016-08-14 DIAGNOSIS — L570 Actinic keratosis: Secondary | ICD-10-CM | POA: Diagnosis not present

## 2016-08-14 DIAGNOSIS — L820 Inflamed seborrheic keratosis: Secondary | ICD-10-CM | POA: Diagnosis not present

## 2016-08-20 ENCOUNTER — Ambulatory Visit: Payer: Medicare Other | Admitting: Pulmonary Disease

## 2016-08-27 ENCOUNTER — Other Ambulatory Visit: Payer: Self-pay | Admitting: Cardiology

## 2016-08-27 NOTE — Telephone Encounter (Signed)
Rx(s) sent to pharmacy electronically.  

## 2016-08-28 ENCOUNTER — Other Ambulatory Visit: Payer: Self-pay | Admitting: Family Medicine

## 2016-09-03 ENCOUNTER — Other Ambulatory Visit: Payer: Self-pay

## 2016-09-03 DIAGNOSIS — D500 Iron deficiency anemia secondary to blood loss (chronic): Secondary | ICD-10-CM

## 2016-09-03 DIAGNOSIS — D510 Vitamin B12 deficiency anemia due to intrinsic factor deficiency: Secondary | ICD-10-CM

## 2016-09-04 ENCOUNTER — Ambulatory Visit (HOSPITAL_BASED_OUTPATIENT_CLINIC_OR_DEPARTMENT_OTHER): Payer: Medicare Other

## 2016-09-04 ENCOUNTER — Other Ambulatory Visit (HOSPITAL_BASED_OUTPATIENT_CLINIC_OR_DEPARTMENT_OTHER): Payer: Medicare Other

## 2016-09-04 VITALS — BP 138/47 | HR 77 | Resp 18

## 2016-09-04 DIAGNOSIS — D510 Vitamin B12 deficiency anemia due to intrinsic factor deficiency: Secondary | ICD-10-CM

## 2016-09-04 DIAGNOSIS — D500 Iron deficiency anemia secondary to blood loss (chronic): Secondary | ICD-10-CM | POA: Diagnosis not present

## 2016-09-04 DIAGNOSIS — D519 Vitamin B12 deficiency anemia, unspecified: Secondary | ICD-10-CM

## 2016-09-04 DIAGNOSIS — D508 Other iron deficiency anemias: Secondary | ICD-10-CM

## 2016-09-04 LAB — CBC WITH DIFFERENTIAL (CANCER CENTER ONLY)
BASO#: 0 10*3/uL (ref 0.0–0.2)
BASO%: 0.4 % (ref 0.0–2.0)
EOS%: 2.2 % (ref 0.0–7.0)
Eosinophils Absolute: 0.2 10*3/uL (ref 0.0–0.5)
HCT: 42.1 % (ref 38.7–49.9)
HGB: 13.7 g/dL (ref 13.0–17.1)
LYMPH#: 1.6 10*3/uL (ref 0.9–3.3)
LYMPH%: 20.3 % (ref 14.0–48.0)
MCH: 28.4 pg (ref 28.0–33.4)
MCHC: 32.5 g/dL (ref 32.0–35.9)
MCV: 87 fL (ref 82–98)
MONO#: 0.8 10*3/uL (ref 0.1–0.9)
MONO%: 10.3 % (ref 0.0–13.0)
NEUT#: 5.3 10*3/uL (ref 1.5–6.5)
NEUT%: 66.8 % (ref 40.0–80.0)
PLATELETS: 202 10*3/uL (ref 145–400)
RBC: 4.82 10*6/uL (ref 4.20–5.70)
RDW: 15.3 % (ref 11.1–15.7)
WBC: 7.9 10*3/uL (ref 4.0–10.0)

## 2016-09-04 MED ORDER — CYANOCOBALAMIN 1000 MCG/ML IJ SOLN
INTRAMUSCULAR | Status: AC
  Filled 2016-09-04: qty 1

## 2016-09-04 MED ORDER — CYANOCOBALAMIN 1000 MCG/ML IJ SOLN
1000.0000 ug | Freq: Once | INTRAMUSCULAR | Status: AC
  Administered 2016-09-04: 1000 ug via INTRAMUSCULAR

## 2016-09-05 ENCOUNTER — Telehealth: Payer: Self-pay | Admitting: *Deleted

## 2016-09-05 LAB — VITAMIN B12: VITAMIN B 12: 288 pg/mL (ref 232–1245)

## 2016-09-05 LAB — RETICULOCYTES: Reticulocyte Count: 1.3 % (ref 0.6–2.6)

## 2016-09-05 LAB — IRON AND TIBC
%SAT: 20 % (ref 20–55)
IRON: 60 ug/dL (ref 42–163)
TIBC: 306 ug/dL (ref 202–409)
UIBC: 246 ug/dL (ref 117–376)

## 2016-09-05 LAB — FERRITIN: Ferritin: 208 ng/ml (ref 22–316)

## 2016-09-05 NOTE — Telephone Encounter (Addendum)
Patient is aware of results. Appointment made  ----- Message from [name], MD sent at 09/05/2016 12:08 PM EST ----- Call - iron is borderline low!!!  Needs 1 dose of Feraheme!!  please set this up!!  Laurey Arrow

## 2016-09-09 ENCOUNTER — Ambulatory Visit: Payer: Medicare Other

## 2016-09-12 ENCOUNTER — Ambulatory Visit (HOSPITAL_BASED_OUTPATIENT_CLINIC_OR_DEPARTMENT_OTHER): Payer: Medicare Other

## 2016-09-12 VITALS — BP 131/52 | HR 44 | Temp 97.8°F | Resp 16

## 2016-09-12 DIAGNOSIS — D519 Vitamin B12 deficiency anemia, unspecified: Secondary | ICD-10-CM

## 2016-09-12 DIAGNOSIS — D508 Other iron deficiency anemias: Secondary | ICD-10-CM | POA: Diagnosis present

## 2016-09-12 MED ORDER — FERUMOXYTOL INJECTION 510 MG/17 ML
510.0000 mg | Freq: Once | INTRAVENOUS | Status: AC
  Administered 2016-09-12: 510 mg via INTRAVENOUS
  Filled 2016-09-12: qty 17

## 2016-09-12 NOTE — Patient Instructions (Signed)

## 2016-09-16 ENCOUNTER — Encounter: Payer: Self-pay | Admitting: Pulmonary Disease

## 2016-09-18 ENCOUNTER — Other Ambulatory Visit: Payer: Self-pay

## 2016-09-18 ENCOUNTER — Ambulatory Visit (INDEPENDENT_AMBULATORY_CARE_PROVIDER_SITE_OTHER): Payer: Medicare Other | Admitting: Pulmonary Disease

## 2016-09-18 ENCOUNTER — Encounter: Payer: Self-pay | Admitting: Pulmonary Disease

## 2016-09-18 DIAGNOSIS — E6609 Other obesity due to excess calories: Secondary | ICD-10-CM | POA: Diagnosis not present

## 2016-09-18 DIAGNOSIS — E669 Obesity, unspecified: Secondary | ICD-10-CM | POA: Insufficient documentation

## 2016-09-18 DIAGNOSIS — G4733 Obstructive sleep apnea (adult) (pediatric): Secondary | ICD-10-CM | POA: Diagnosis not present

## 2016-09-18 MED ORDER — NITROGLYCERIN 0.4 MG SL SUBL: 0.4000 mg | SUBLINGUAL_TABLET | SUBLINGUAL | 3 refills | Status: DC | PRN

## 2016-09-18 NOTE — Patient Instructions (Signed)
CPAP is set at 15 cm and seems to be working well. CPAP supplies will be renewed for a year

## 2016-09-18 NOTE — Addendum Note (Signed)
Addended by: Valerie Salts on: 09/18/2016 03:15 PM   Modules accepted: Orders

## 2016-09-18 NOTE — Assessment & Plan Note (Signed)
Weight loss encouraged 

## 2016-09-18 NOTE — Assessment & Plan Note (Signed)
CPAP is set at 15 cm and seems to be working well. CPAP supplies will be renewed for a year He has adjusted to his new machine very well and this is certainly helping him   Weight loss encouraged, compliance with goal of at least 4-6 hrs every night is the expectation. Advised against medications with sedative side effects Cautioned against driving when sleepy - understanding that sleepiness will vary on a day to day basis

## 2016-09-18 NOTE — Progress Notes (Signed)
   Subjective:    Patient ID: Richard [name], male    DOB: [birth year], 76 y.o.   MRN: [id number]  HPI  76 year old male  For FU of obstructive sleep apnea.   09/18/2016 Chief Complaint  Patient presents with  . Follow-up    81yr f/u for OSA. Breathing has been ok since last visit. No issues with machine. Received a new machine back in May 2017.     He got a new CPAP machine in 10/2015 This seems to be working well. He plays 18 holes of golf every day. He occasionally naps in his recliner and wakes up with a headache He is very good about using his CPAP every night with nasal mask, he does have a leak around his nose and uses a Band-Aid, in the past he use nasal pads. No mask or the pressure issues. CPAP download was reviewed and this shows good control of events on 15 cm with moderate leak in excellent compliance with average of 8 hours every night  He continues to have nocturia but this is improved from prior   Significant tests/ events 2008 PSG RDI 48/h ONO on CPAP /RA 08/2014 >> no desatn - dc O2   Review of Systems neg for any significant sore throat, dysphagia, itching, sneezing, nasal congestion or excess/ purulent secretions, fever, chills, sweats, unintended wt loss, pleuritic or exertional cp, hempoptysis, orthopnea pnd or change in chronic leg swelling. Also denies presyncope, palpitations, heartburn, abdominal pain, nausea, vomiting, diarrhea or change in bowel or urinary habits, dysuria,hematuria, rash, arthralgias, visual complaints, headache, numbness weakness or ataxia.     Objective:   Physical Exam  Gen. Pleasant, obese, in no distress ENT - no lesions, no post nasal drip Neck: No JVD, no thyromegaly, no carotid bruits Lungs: no use of accessory muscles, no dullness to percussion, decreased without rales or rhonchi  Cardiovascular: Rhythm regular, heart sounds  normal, no murmurs or gallops, no peripheral edema Musculoskeletal: No deformities, no cyanosis or  clubbing , no tremors       Assessment & Plan:

## 2016-09-19 ENCOUNTER — Other Ambulatory Visit: Payer: Self-pay | Admitting: Cardiology

## 2016-10-01 ENCOUNTER — Other Ambulatory Visit: Payer: Self-pay

## 2016-10-01 DIAGNOSIS — D510 Vitamin B12 deficiency anemia due to intrinsic factor deficiency: Secondary | ICD-10-CM

## 2016-10-01 DIAGNOSIS — D500 Iron deficiency anemia secondary to blood loss (chronic): Secondary | ICD-10-CM

## 2016-10-02 ENCOUNTER — Ambulatory Visit (HOSPITAL_BASED_OUTPATIENT_CLINIC_OR_DEPARTMENT_OTHER): Payer: Medicare Other

## 2016-10-02 ENCOUNTER — Other Ambulatory Visit (HOSPITAL_BASED_OUTPATIENT_CLINIC_OR_DEPARTMENT_OTHER): Payer: Medicare Other

## 2016-10-02 ENCOUNTER — Other Ambulatory Visit: Payer: Medicare Other

## 2016-10-02 ENCOUNTER — Ambulatory Visit (HOSPITAL_BASED_OUTPATIENT_CLINIC_OR_DEPARTMENT_OTHER): Payer: Medicare Other | Admitting: Family

## 2016-10-02 ENCOUNTER — Other Ambulatory Visit: Payer: Self-pay | Admitting: Family Medicine

## 2016-10-02 ENCOUNTER — Ambulatory Visit: Payer: Medicare Other

## 2016-10-02 ENCOUNTER — Ambulatory Visit: Payer: Medicare Other | Admitting: Hematology & Oncology

## 2016-10-02 VITALS — BP 130/56 | HR 40 | Temp 98.1°F | Resp 17 | Wt 270.1 lb

## 2016-10-02 DIAGNOSIS — D500 Iron deficiency anemia secondary to blood loss (chronic): Secondary | ICD-10-CM

## 2016-10-02 DIAGNOSIS — D519 Vitamin B12 deficiency anemia, unspecified: Secondary | ICD-10-CM

## 2016-10-02 DIAGNOSIS — K922 Gastrointestinal hemorrhage, unspecified: Secondary | ICD-10-CM | POA: Diagnosis not present

## 2016-10-02 DIAGNOSIS — D508 Other iron deficiency anemias: Secondary | ICD-10-CM

## 2016-10-02 DIAGNOSIS — K909 Intestinal malabsorption, unspecified: Secondary | ICD-10-CM

## 2016-10-02 DIAGNOSIS — D510 Vitamin B12 deficiency anemia due to intrinsic factor deficiency: Secondary | ICD-10-CM | POA: Diagnosis not present

## 2016-10-02 LAB — CBC WITH DIFFERENTIAL (CANCER CENTER ONLY)
BASO#: 0 10*3/uL (ref 0.0–0.2)
BASO%: 0.5 % (ref 0.0–2.0)
EOS%: 2 % (ref 0.0–7.0)
Eosinophils Absolute: 0.2 10*3/uL (ref 0.0–0.5)
HCT: 41.8 % (ref 38.7–49.9)
HGB: 13.7 g/dL (ref 13.0–17.1)
LYMPH#: 1.8 10*3/uL (ref 0.9–3.3)
LYMPH%: 22.5 % (ref 14.0–48.0)
MCH: 28.7 pg (ref 28.0–33.4)
MCHC: 32.8 g/dL (ref 32.0–35.9)
MCV: 88 fL (ref 82–98)
MONO#: 0.8 10*3/uL (ref 0.1–0.9)
MONO%: 9.6 % (ref 0.0–13.0)
NEUT%: 65.4 % (ref 40.0–80.0)
NEUTROS ABS: 5.1 10*3/uL (ref 1.5–6.5)
Platelets: 199 10*3/uL (ref 145–400)
RBC: 4.77 10*6/uL (ref 4.20–5.70)
RDW: 15.4 % (ref 11.1–15.7)
WBC: 7.9 10*3/uL (ref 4.0–10.0)

## 2016-10-02 LAB — RETICULOCYTES: Reticulocyte Count: 1 % (ref 0.6–2.6)

## 2016-10-02 MED ORDER — CYANOCOBALAMIN 1000 MCG/ML IJ SOLN
1000.0000 ug | Freq: Once | INTRAMUSCULAR | Status: AC
  Administered 2016-10-02: 1000 ug via INTRAMUSCULAR

## 2016-10-02 MED ORDER — CYANOCOBALAMIN 1000 MCG/ML IJ SOLN
INTRAMUSCULAR | Status: AC
  Filled 2016-10-02: qty 1

## 2016-10-02 NOTE — Progress Notes (Addendum)
Hematology and Oncology Follow Up Visit  LAWRANCE WIEDEMANN [id number] 07-Mar-1941 76 y.o. 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 Reister is here today for follow-up. He is staying busy and continues to play golf every day with 7 of his friends. He has also been spending a lot of time with his granddaughters. He has occasional fatigue but states that this is not often. He has bradycardia and is followed by cardiology. He states that they have told him that he may eventually need a pacemaker but it has not come to that yet.  No fever, chills, n/v, cough, rash, dizziness, SOB, chest pain, palpitations, abdominal pain or changes in bowel or bladder habits.  He has chronic swelling in both lower extremities and takes lasix 80 mg PO twice daily to help reduce the edema. No erythema or tenderness in his extremities. The neuropathy in his feet is unchanged.  No falls or syncope.  No episodes of bleeding or bruising. No lymphadenopathy found on exam.  He has maintained a good appetite and is staying well hydrated. His weight is stable.  He states that his blood sugars have been well controlled on his current medication regimen.   ECOG Performance Status: 1 - Symptomatic but completely ambulatory  Medications:  Allergies as of 10/02/2016      Reactions   Benazepril    angioedema; he is not a candidate for any angiotensin receptor blockers because of this significant allergic reaction. Because of a history of documented adverse serious drug reaction;Medi Alert bracelet  is recommended   Hctz [hydrochlorothiazide] Swelling   Tongue and lip swelling   Lactose Intolerance (gi)    Aspirin Other (See Comments)   Gastritis, cant  take 325 Mg aspirin       Medication List       Accurate as of 10/02/16  3:39 PM. Always use your most recent med list.          allopurinol 300 MG tablet Commonly known as:  ZYLOPRIM Take 450 mg by mouth daily.   amLODipine 5 MG tablet Commonly known as:  NORVASC TAKE 2 TABLETS (10 MG TOTAL) BY MOUTH DAILY.   aspirin 81 MG tablet Take 81 mg by mouth daily.   atorvastatin 80 MG tablet Commonly known as:  LIPITOR TAKE ONE TABLET BY MOUTH AT BEDTIME   esomeprazole 40 MG capsule Commonly known as:  NEXIUM Take 1 capsule (40 mg total) by mouth daily.   ezetimibe 10 MG tablet Commonly known as:  ZETIA TAKE ONE TABLET BY MOUTH DAILY   fenofibrate 160 MG tablet TAKE 1 TABLET BY MOUTH EVERY DAY   fluticasone 50 MCG/ACT nasal spray Commonly known as:  FLONASE INHALE ONE SPRAY INTO EACH NOSTRIL TWICE DAILY.   freestyle lancets TEST ONE TIME DAILY   FREESTYLE LITE test strip Generic drug:  glucose blood TEST ONE TIME DAILY   furosemide 40 MG tablet Commonly known as:  LASIX Take 2 tablets (80 mg total) by mouth 2 (two) times daily.   glimepiride 2 MG tablet Commonly known as:  AMARYL TAKE 1 TABLET (2 MG TOTAL) BY MOUTH DAILY WITH BREAKFAST.   metFORMIN 1000 MG tablet Commonly known as:  GLUCOPHAGE TAKE ONE AND ONE-HALF TABLETS BY  MOUTH EVERY MORNING AND 1 TALBET IN THE EVENING.   metoprolol tartrate 25 MG tablet Commonly known as:  LOPRESSOR TAKE ONE-HALF TABLET BY MOUTH TWICE DAILY   montelukast 10 MG tablet Commonly known as:  SINGULAIR Take 1 tablet (10 mg total) by mouth at bedtime.   nitroGLYCERIN 0.4 MG SL tablet Commonly known as:  NITROSTAT Place 1 tablet (0.4 mg total) under the tongue every 5 (five) minutes as needed for chest pain.   potassium chloride SA 20 MEQ tablet Commonly known as:  K-DUR,KLOR-CON TAKE ONE TABLET BY MOUTH TWICE DAILY   pregabalin 75 MG capsule Commonly known as:  LYRICA Take 75 mg by mouth 2 (two) times daily.     psyllium 95 % Pack Commonly known as:  HYDROCIL/METAMUCIL Take 1 packet by mouth 2 (two) times daily.   vitamin C 500 MG tablet Commonly known as:  ASCORBIC ACID Take 500 mg by mouth every morning.       Allergies:  Allergies  Allergen Reactions  . Benazepril     angioedema; he is not a candidate for any angiotensin receptor blockers because of this significant allergic reaction. Because of a history of documented adverse serious drug reaction;Medi Alert bracelet  is recommended  . Hctz [Hydrochlorothiazide] Swelling    Tongue and lip swelling   . Lactose Intolerance (Gi)   . Aspirin Other (See Comments)    Gastritis, cant take 325 Mg aspirin     Past Medical History, Surgical history, Social history, and Family History were reviewed and updated.  Review of Systems: All other 10 point review of systems is negative.   Physical Exam:  weight is 270 lb 1.9 oz (122.5 kg). His oral temperature is 98.1 F (36.7 C). His blood pressure is 130/56 (abnormal) and his pulse is 40 (abnormal). His respiration is 17 and oxygen saturation is 96%.   Wt Readings from Last 3 Encounters:  10/02/16 270 lb 1.9 oz (122.5 kg)  09/18/16 270 lb (122.5 kg)  06/27/16 272 lb (123.4 kg)    Ocular: Sclerae unicteric, pupils equal, round and reactive to light Ear-nose-throat: Oropharynx clear, dentition fair Lymphatic: No cervical, supraclavicular or axillary adenopathy Lungs no rales or rhonchi, good excursion bilaterally Heart regular rate and rhythm, slight murmur appreciated  Abd soft, nontender, positive bowel sounds, no liver or spleen tip palpated on exam, no fluid wave MSK no focal spinal tenderness, no joint edema Neuro: non-focal, well-oriented, appropriate affect Breasts: Deferred  Lab Results  Component Value Date   WBC 7.9 10/02/2016   HGB 13.7 10/02/2016   HCT 41.8 10/02/2016   MCV 88 10/02/2016   PLT 199 10/02/2016   Lab Results  Component Value Date   FERRITIN 208  09/04/2016   IRON 60 09/04/2016   TIBC 306 09/04/2016   UIBC 246 09/04/2016   IRONPCTSAT 20 09/04/2016   Lab Results  Component Value Date   RETICCTPCT 1.1 11/22/2011   RBC 4.77 10/02/2016   RETICCTABS 52.1 11/22/2011   No results found for: KPAFRELGTCHN, LAMBDASER, KAPLAMBRATIO Lab Results  Component Value Date   IGA 159 03/09/2012   No results found for: Kathrynn Ducking, MSPIKE, SPEI   Chemistry      Component Value Date/Time   NA 139 04/17/2016 1455   K 4.4 04/17/2016 1455   CL 101 04/17/2016 1455   CO2 30 (H) 04/17/2016 1455   BUN 22 04/17/2016 1455   CREATININE 1.38 (H) 04/17/2016 1455   CREATININE 1.02 05/22/2011 1621  Component Value Date/Time   CALCIUM 10.2 04/17/2016 1455   ALKPHOS 75 04/17/2016 1455   AST 21 04/17/2016 1455   ALT 19 04/17/2016 1455   BILITOT 0.4 04/17/2016 1455     Impression and Plan: Mr. Hanawalt is a 76 yo caucasian male with history of iron deficiency anemia secondary to malabsorption. He also has some chronic low-grade GI bleeding. He has occasional episodes of fatigue that come and go.  Hgb is stable at 13.7 with an MCV of 88. Iron studies are pending. We will see what these show and bring him back in later this week for an infusion if needed.  B 12 last month was 288. He will receive his injection today as planned and leave him on his monthly regimen for now.  We will go ahead and plan to see him back in 6 months for repeat lab work and follow-up.  I spent about 25 minutes counseling the patient, all questions were answered.  He will contact our office with any questions or concerns. We can certainly see him sooner if need be.   Eliezer Bottom, NP 4/4/20183:39 PM

## 2016-10-03 LAB — IRON AND TIBC
%SAT: 20 % (ref 20–55)
IRON: 62 ug/dL (ref 42–163)
TIBC: 305 ug/dL (ref 202–409)
UIBC: 243 ug/dL (ref 117–376)

## 2016-10-03 LAB — FERRITIN: FERRITIN: 374 ng/mL — AB (ref 22–316)

## 2016-10-04 ENCOUNTER — Other Ambulatory Visit: Payer: Self-pay | Admitting: Family Medicine

## 2016-10-04 ENCOUNTER — Telehealth: Payer: Self-pay | Admitting: *Deleted

## 2016-10-04 NOTE — Telephone Encounter (Signed)
Notified patient no iron needed at this time.

## 2016-10-04 NOTE — Telephone Encounter (Signed)
-----   Message from Eliezer Bottom, NP sent at 10/04/2016 10:29 AM EDT ----- Regarding: Iron  Iron stable at this time. No infusion needed. Thank you!  Sarah  ----- Message ----- From: [name], MD Sent: 10/03/2016   1:51 PM To: Eliezer Bottom, NP    ----- Message ----- From: Interface, Lab In Three Zero One Sent: 10/02/2016   2:56 PM To: [name], MD

## 2016-10-08 ENCOUNTER — Telehealth: Payer: Self-pay | Admitting: Family Medicine

## 2016-10-08 MED ORDER — FREESTYLE LANCETS MISC: 6 refills | Status: DC

## 2016-10-08 NOTE — Telephone Encounter (Signed)
Caller name: Sidra Relation to pt: [facility] pharmacist Call back number: [phone] Pharmacy[facility]  Reason for call: Pharmacist needs correction on prescription for Lancets (FREESTYLE) lancets that was sent on 10-04-2016. Pharmacist states to please call the number mentioned above that she can give the information that needs to corrected. Please advise.

## 2016-10-08 NOTE — Telephone Encounter (Signed)
Called the pharmacy and they needed only the prescription sent in with dx code printed on prescription. Which I took care of.

## 2016-10-16 DIAGNOSIS — H2513 Age-related nuclear cataract, bilateral: Secondary | ICD-10-CM | POA: Diagnosis not present

## 2016-10-16 LAB — HM DIABETES EYE EXAM

## 2016-10-17 ENCOUNTER — Encounter: Payer: Self-pay | Admitting: Family Medicine

## 2016-10-24 DIAGNOSIS — H2513 Age-related nuclear cataract, bilateral: Secondary | ICD-10-CM | POA: Diagnosis not present

## 2016-10-28 ENCOUNTER — Encounter
Admission: RE | Admit: 2016-10-28 | Discharge: 2016-10-28 | Disposition: A | Discharge status: Home / Self Care | Payer: Medicare Other | Source: Ambulatory Visit | Attending: Ophthalmology | Admitting: Ophthalmology

## 2016-10-28 ENCOUNTER — Telehealth: Payer: Self-pay | Admitting: Cardiology

## 2016-10-28 DIAGNOSIS — I2510 Atherosclerotic heart disease of native coronary artery without angina pectoris: Secondary | ICD-10-CM | POA: Diagnosis not present

## 2016-10-28 DIAGNOSIS — Z951 Presence of aortocoronary bypass graft: Secondary | ICD-10-CM | POA: Diagnosis not present

## 2016-10-28 DIAGNOSIS — R001 Bradycardia, unspecified: Secondary | ICD-10-CM | POA: Diagnosis not present

## 2016-10-28 NOTE — Telephone Encounter (Signed)
New message      Request for surgical clearance:  What type of surgery is being performed? cataract When is this surgery scheduled?  11-06-16 Are there any medications that need to be held prior to surgery and how long? Faxing ekg to [phone] to have doctor look at and determine if pt can have surgery on the 9th. Please fax response 1. Name of physician performing surgery?  Dr Sandra Cockayne  What is your office phone and fax number?  Fax [phone]

## 2016-10-30 ENCOUNTER — Encounter: Payer: Self-pay | Admitting: *Deleted

## 2016-10-30 ENCOUNTER — Ambulatory Visit (HOSPITAL_BASED_OUTPATIENT_CLINIC_OR_DEPARTMENT_OTHER): Payer: Medicare Other

## 2016-10-30 ENCOUNTER — Other Ambulatory Visit (HOSPITAL_BASED_OUTPATIENT_CLINIC_OR_DEPARTMENT_OTHER): Payer: Medicare Other

## 2016-10-30 VITALS — BP 113/50 | HR 45 | Temp 98.3°F | Resp 16

## 2016-10-30 DIAGNOSIS — D500 Iron deficiency anemia secondary to blood loss (chronic): Secondary | ICD-10-CM | POA: Diagnosis not present

## 2016-10-30 DIAGNOSIS — D510 Vitamin B12 deficiency anemia due to intrinsic factor deficiency: Secondary | ICD-10-CM

## 2016-10-30 DIAGNOSIS — D519 Vitamin B12 deficiency anemia, unspecified: Secondary | ICD-10-CM

## 2016-10-30 DIAGNOSIS — D508 Other iron deficiency anemias: Secondary | ICD-10-CM

## 2016-10-30 LAB — CBC WITH DIFFERENTIAL (CANCER CENTER ONLY)
BASO#: 0 10*3/uL (ref 0.0–0.2)
BASO%: 0.3 % (ref 0.0–2.0)
EOS%: 2.4 % (ref 0.0–7.0)
Eosinophils Absolute: 0.2 10*3/uL (ref 0.0–0.5)
HCT: 41.6 % (ref 38.7–49.9)
HEMOGLOBIN: 13.6 g/dL (ref 13.0–17.1)
LYMPH#: 1.7 10*3/uL (ref 0.9–3.3)
LYMPH%: 20.9 % (ref 14.0–48.0)
MCH: 28.2 pg (ref 28.0–33.4)
MCHC: 32.7 g/dL (ref 32.0–35.9)
MCV: 86 fL (ref 82–98)
MONO#: 0.7 10*3/uL (ref 0.1–0.9)
MONO%: 9.2 % (ref 0.0–13.0)
NEUT%: 67.2 % (ref 40.0–80.0)
NEUTROS ABS: 5.4 10*3/uL (ref 1.5–6.5)
PLATELETS: 205 10*3/uL (ref 145–400)
RBC: 4.82 10*6/uL (ref 4.20–5.70)
RDW: 15.8 % — ABNORMAL HIGH (ref 11.1–15.7)
WBC: 8 10*3/uL (ref 4.0–10.0)

## 2016-10-30 MED ORDER — CYANOCOBALAMIN 1000 MCG/ML IJ SOLN
INTRAMUSCULAR | Status: AC
  Filled 2016-10-30: qty 1

## 2016-10-30 MED ORDER — CYANOCOBALAMIN 1000 MCG/ML IJ SOLN
1000.0000 ug | Freq: Once | INTRAMUSCULAR | Status: AC
  Administered 2016-10-30: 1000 ug via INTRAMUSCULAR

## 2016-10-31 LAB — IRON AND TIBC
%SAT: 20 % (ref 20–55)
Iron: 59 ug/dL (ref 42–163)
TIBC: 296 ug/dL (ref 202–409)
UIBC: 237 ug/dL (ref 117–376)

## 2016-10-31 LAB — VITAMIN B12: VITAMIN B 12: 314 pg/mL (ref 232–1245)

## 2016-10-31 LAB — FERRITIN: Ferritin: 315 ng/ml (ref 22–316)

## 2016-11-01 NOTE — Telephone Encounter (Signed)
Follow up    Pt is having surgery next Wednesday 5/9 , did you receive ekg?  Needs the surgical clearance today if possible    Dorian Pod [phone]   [phone] fax

## 2016-11-03 NOTE — Telephone Encounter (Signed)
The patient is going for cataract surgery.  This is considered very low risk.   No further testing is indicated.  Therefore, based on ACC/AHA guidelines, the patient would be at acceptable risk for the planned procedure without further cardiovascular testing.

## 2016-11-04 ENCOUNTER — Telehealth: Payer: Self-pay | Admitting: Cardiology

## 2016-11-04 NOTE — Telephone Encounter (Signed)
Returned call to [employer] with [employer].Clearance note refaxed to her at fax# [phone].

## 2016-11-04 NOTE — Telephone Encounter (Signed)
She is calling concerning fax that she sent on 10-29-16,she needs this asap please. Please fax to [phone].

## 2016-11-04 NOTE — Telephone Encounter (Signed)
f/u call: Dorian Pod calling from [facility] states that she sent over an EKG for Dr. Percival Spanish to check to give cardiac clearance for surgery on Wednesday. Fax number is [phone]. Thanks.

## 2016-11-04 NOTE — Pre-Procedure Instructions (Signed)
Cleared by dr Percival Spanish 11/03/16 on chart

## 2016-11-04 NOTE — Telephone Encounter (Signed)
Sent via Epic

## 2016-11-05 NOTE — H&P (Signed)
See scanned note.

## 2016-11-06 ENCOUNTER — Ambulatory Visit: Payer: Medicare Other | Admitting: Anesthesiology

## 2016-11-06 ENCOUNTER — Encounter: Payer: Self-pay | Admitting: *Deleted

## 2016-11-06 ENCOUNTER — Encounter
Admission: RE | Disposition: A | Discharge status: Home / Self Care | Payer: Self-pay | Source: Ambulatory Visit | Attending: Ophthalmology

## 2016-11-06 ENCOUNTER — Ambulatory Visit
Admission: RE | Admit: 2016-11-06 | Discharge: 2016-11-06 | Disposition: A | Discharge status: Home / Self Care | Payer: Medicare Other | Source: Ambulatory Visit | Attending: Ophthalmology | Admitting: Ophthalmology

## 2016-11-06 DIAGNOSIS — I2510 Atherosclerotic heart disease of native coronary artery without angina pectoris: Secondary | ICD-10-CM | POA: Insufficient documentation

## 2016-11-06 DIAGNOSIS — D649 Anemia, unspecified: Secondary | ICD-10-CM | POA: Diagnosis not present

## 2016-11-06 DIAGNOSIS — G709 Myoneural disorder, unspecified: Secondary | ICD-10-CM | POA: Insufficient documentation

## 2016-11-06 DIAGNOSIS — K219 Gastro-esophageal reflux disease without esophagitis: Secondary | ICD-10-CM | POA: Diagnosis not present

## 2016-11-06 DIAGNOSIS — E1136 Type 2 diabetes mellitus with diabetic cataract: Secondary | ICD-10-CM | POA: Insufficient documentation

## 2016-11-06 DIAGNOSIS — R51 Headache: Secondary | ICD-10-CM | POA: Diagnosis not present

## 2016-11-06 DIAGNOSIS — J449 Chronic obstructive pulmonary disease, unspecified: Secondary | ICD-10-CM | POA: Insufficient documentation

## 2016-11-06 DIAGNOSIS — K449 Diaphragmatic hernia without obstruction or gangrene: Secondary | ICD-10-CM | POA: Diagnosis not present

## 2016-11-06 DIAGNOSIS — Z79899 Other long term (current) drug therapy: Secondary | ICD-10-CM | POA: Insufficient documentation

## 2016-11-06 DIAGNOSIS — I10 Essential (primary) hypertension: Secondary | ICD-10-CM | POA: Diagnosis not present

## 2016-11-06 DIAGNOSIS — G4730 Sleep apnea, unspecified: Secondary | ICD-10-CM | POA: Insufficient documentation

## 2016-11-06 DIAGNOSIS — M109 Gout, unspecified: Secondary | ICD-10-CM | POA: Insufficient documentation

## 2016-11-06 DIAGNOSIS — M1990 Unspecified osteoarthritis, unspecified site: Secondary | ICD-10-CM | POA: Diagnosis not present

## 2016-11-06 DIAGNOSIS — H2512 Age-related nuclear cataract, left eye: Secondary | ICD-10-CM | POA: Diagnosis not present

## 2016-11-06 DIAGNOSIS — H2513 Age-related nuclear cataract, bilateral: Secondary | ICD-10-CM | POA: Diagnosis not present

## 2016-11-06 HISTORY — PX: CATARACT EXTRACTION W/PHACO: SHX586

## 2016-11-06 LAB — GLUCOSE, CAPILLARY: Glucose-Capillary: 140 mg/dL — ABNORMAL HIGH (ref 65–99)

## 2016-11-06 SURGERY — PHACOEMULSIFICATION, CATARACT, WITH IOL INSERTION
Anesthesia: Monitor Anesthesia Care | Site: Eye | Laterality: Left | Wound class: Clean

## 2016-11-06 MED ORDER — TETRACAINE HCL 0.5 % OP SOLN
OPHTHALMIC | Status: AC
  Filled 2016-11-06: qty 2

## 2016-11-06 MED ORDER — POVIDONE-IODINE 5 % OP SOLN
OPHTHALMIC | Status: AC
  Filled 2016-11-06: qty 30

## 2016-11-06 MED ORDER — TETRACAINE HCL 0.5 % OP SOLN
OPHTHALMIC | Status: DC | PRN
  Administered 2016-11-06: 2 [drp] via OPHTHALMIC

## 2016-11-06 MED ORDER — CARBACHOL 0.01 % IO SOLN
INTRAOCULAR | Status: DC | PRN
  Administered 2016-11-06: 0.5 mL via INTRAOCULAR

## 2016-11-06 MED ORDER — SODIUM CHLORIDE 0.9 % IV SOLN
INTRAVENOUS | Status: DC
  Administered 2016-11-06: 07:00:00 via INTRAVENOUS

## 2016-11-06 MED ORDER — EPINEPHRINE PF 1 MG/ML IJ SOLN
INTRAOCULAR | Status: DC | PRN
  Administered 2016-11-06: 200 mL via OPHTHALMIC

## 2016-11-06 MED ORDER — POVIDONE-IODINE 5 % OP SOLN
OPHTHALMIC | Status: DC | PRN
  Administered 2016-11-06: 1 via OPHTHALMIC

## 2016-11-06 MED ORDER — CYCLOPENTOLATE HCL 2 % OP SOLN
OPHTHALMIC | Status: AC
  Administered 2016-11-06: 1 [drp] via OPHTHALMIC
  Filled 2016-11-06: qty 2

## 2016-11-06 MED ORDER — MIDAZOLAM HCL 2 MG/2ML IJ SOLN
INTRAMUSCULAR | Status: DC | PRN
  Administered 2016-11-06: 1 mg via INTRAVENOUS

## 2016-11-06 MED ORDER — CEFUROXIME OPHTHALMIC INJECTION 1 MG/0.1 ML
INJECTION | OPHTHALMIC | Status: AC
  Filled 2016-11-06: qty 0.1

## 2016-11-06 MED ORDER — MIDAZOLAM HCL 2 MG/2ML IJ SOLN
INTRAMUSCULAR | Status: AC
  Filled 2016-11-06: qty 2

## 2016-11-06 MED ORDER — HYALURONIDASE HUMAN 150 UNIT/ML IJ SOLN
INTRAMUSCULAR | Status: AC
  Filled 2016-11-06: qty 1

## 2016-11-06 MED ORDER — CEFUROXIME OPHTHALMIC INJECTION 1 MG/0.1 ML
INJECTION | OPHTHALMIC | Status: DC | PRN
  Administered 2016-11-06: 1 mg via INTRACAMERAL

## 2016-11-06 MED ORDER — EPINEPHRINE PF 1 MG/ML IJ SOLN
INTRAMUSCULAR | Status: AC
  Filled 2016-11-06: qty 2

## 2016-11-06 MED ORDER — CYCLOPENTOLATE HCL 2 % OP SOLN
1.0000 [drp] | OPHTHALMIC | Status: AC
  Administered 2016-11-06 (×4): 1 [drp] via OPHTHALMIC

## 2016-11-06 MED ORDER — PHENYLEPHRINE HCL 10 % OP SOLN
1.0000 [drp] | OPHTHALMIC | Status: DC
  Administered 2016-11-06 (×4): 1 [drp] via OPHTHALMIC

## 2016-11-06 MED ORDER — LIDOCAINE HCL (PF) 4 % IJ SOLN
INTRAOCULAR | Status: DC | PRN
  Administered 2016-11-06: 4 mL via OPHTHALMIC

## 2016-11-06 MED ORDER — MOXIFLOXACIN HCL 0.5 % OP SOLN
1.0000 [drp] | OPHTHALMIC | Status: AC
  Administered 2016-11-06 (×3): 1 [drp] via OPHTHALMIC

## 2016-11-06 MED ORDER — LIDOCAINE HCL (PF) 4 % IJ SOLN
INTRAMUSCULAR | Status: DC | PRN
  Administered 2016-11-06: 4 mL via OPHTHALMIC

## 2016-11-06 MED ORDER — PHENYLEPHRINE HCL 10 % OP SOLN
OPHTHALMIC | Status: AC
  Administered 2016-11-06: 1 [drp] via OPHTHALMIC
  Filled 2016-11-06: qty 5

## 2016-11-06 MED ORDER — NA CHONDROIT SULF-NA HYALURON 40-17 MG/ML IO SOLN
INTRAOCULAR | Status: DC | PRN
  Administered 2016-11-06: 1 mL via INTRAOCULAR

## 2016-11-06 MED ORDER — PROPOFOL 10 MG/ML IV BOLUS
INTRAVENOUS | Status: AC
  Filled 2016-11-06: qty 20

## 2016-11-06 MED ORDER — NA CHONDROIT SULF-NA HYALURON 40-17 MG/ML IO SOLN
INTRAOCULAR | Status: AC
  Filled 2016-11-06: qty 1

## 2016-11-06 MED ORDER — MOXIFLOXACIN HCL 0.5 % OP SOLN
OPHTHALMIC | Status: AC
  Administered 2016-11-06: 1 [drp] via OPHTHALMIC
  Filled 2016-11-06: qty 3

## 2016-11-06 MED ORDER — MOXIFLOXACIN HCL 0.5 % OP SOLN
OPHTHALMIC | Status: DC | PRN
  Administered 2016-11-06: 1 [drp] via OPHTHALMIC

## 2016-11-06 MED ORDER — BUPIVACAINE HCL (PF) 0.75 % IJ SOLN
INTRAMUSCULAR | Status: AC
  Filled 2016-11-06: qty 10

## 2016-11-06 MED ORDER — ALFENTANIL 500 MCG/ML IJ INJ
INJECTION | INTRAVENOUS | Status: DC | PRN
  Administered 2016-11-06 (×2): 250 ug via INTRAVENOUS

## 2016-11-06 SURGICAL SUPPLY — 30 items
CANNULA ANT/CHMB 27G (MISCELLANEOUS) ×1 IMPLANT
CANNULA ANT/CHMB 27GA (MISCELLANEOUS) ×3 IMPLANT
CORD BIP STRL DISP 12FT (MISCELLANEOUS) ×3 IMPLANT
CUP MEDICINE 2OZ PLAST GRAD ST (MISCELLANEOUS) ×3 IMPLANT
DRAPE XRAY CASSETTE 23X24 (DRAPES) ×3 IMPLANT
ERASER HMR WETFIELD 18G (MISCELLANEOUS) ×3 IMPLANT
GLOVE BIO SURGEON STRL SZ8 (GLOVE) ×3 IMPLANT
GLOVE SURG LX 6.5 MICRO (GLOVE) ×2
GLOVE SURG LX 8.0 MICRO (GLOVE) ×2
GLOVE SURG LX STRL 6.5 MICRO (GLOVE) ×1 IMPLANT
GLOVE SURG LX STRL 8.0 MICRO (GLOVE) ×1 IMPLANT
GOWN STRL REUS W/ TWL LRG LVL3 (GOWN DISPOSABLE) ×1 IMPLANT
GOWN STRL REUS W/ TWL XL LVL3 (GOWN DISPOSABLE) ×1 IMPLANT
GOWN STRL REUS W/TWL LRG LVL3 (GOWN DISPOSABLE) ×3
GOWN STRL REUS W/TWL XL LVL3 (GOWN DISPOSABLE) ×3
LENS IOL ACRYSOF IQ 22.5 (Intraocular Lens) ×2 IMPLANT
PACK CATARACT (MISCELLANEOUS) ×3 IMPLANT
PACK CATARACT DINGLEDEIN LX (MISCELLANEOUS) ×3 IMPLANT
PACK EYE AFTER SURG (MISCELLANEOUS) ×3 IMPLANT
SHLD EYE VISITEC  UNIV (MISCELLANEOUS) ×3 IMPLANT
SOL BSS BAG (MISCELLANEOUS) ×3
SOL PREP PVP 2OZ (MISCELLANEOUS) ×3
SOLUTION BSS BAG (MISCELLANEOUS) ×1 IMPLANT
SOLUTION PREP PVP 2OZ (MISCELLANEOUS) ×1 IMPLANT
SUT SILK 5-0 (SUTURE) ×3 IMPLANT
SYR 3ML LL SCALE MARK (SYRINGE) ×3 IMPLANT
SYR 5ML LL (SYRINGE) ×3 IMPLANT
SYR TB 1ML 27GX1/2 LL (SYRINGE) ×3 IMPLANT
WATER STERILE IRR 250ML POUR (IV SOLUTION) ×3 IMPLANT
WIPE NON LINTING 3.25X3.25 (MISCELLANEOUS) ×3 IMPLANT

## 2016-11-06 NOTE — Discharge Instructions (Signed)
Eye Surgery Discharge Instructions  Expect mild scratchy sensation or mild soreness. DO NOT RUB YOUR EYE!  The day of surgery: . Minimal physical activity, but bed rest is not required . No reading, computer work, or close hand work . No bending, lifting, or straining. . May watch TV  For 24 hours: . No driving, legal decisions, or alcoholic beverages . Safety precautions . Eat anything you prefer: It is better to start with liquids, then soup then solid foods. . _____ Eye patch should be worn until postoperative exam tomorrow. . ____ Solar shield eyeglasses should be worn for comfort in the sunlight/patch while sleeping  Resume all regular medications including aspirin or Coumadin if these were discontinued prior to surgery. You may shower, bathe, shave, or wash your hair. Tylenol may be taken for mild discomfort.  Call your doctor if you experience significant pain, nausea, or vomiting, fever > 101 or other signs of infection. [phone] or [phone] Specific instructions:   

## 2016-11-06 NOTE — Anesthesia Preprocedure Evaluation (Signed)
Anesthesia Evaluation  Patient identified by MRN, date of birth, ID band Patient awake    Reviewed: Allergy & Precautions, NPO status , Patient's Chart, lab work & pertinent test results, reviewed documented beta blocker date and time   Airway Mallampati: III  TM Distance: >3 FB     Dental  (+) Chipped   Pulmonary sleep apnea , COPD,           Cardiovascular hypertension, Pt. on medications and Pt. on home beta blockers + CAD  + dysrhythmias      Neuro/Psych  Headaches,  Neuromuscular disease    GI/Hepatic hiatal hernia, GERD  ,  Endo/Other  diabetes, Type 2  Renal/GU Renal InsufficiencyRenal disease     Musculoskeletal  (+) Arthritis ,   Abdominal   Peds  Hematology  (+) anemia ,   Anesthesia Other Findings Gout.  Reproductive/Obstetrics                             Anesthesia Physical Anesthesia Plan  ASA: III  Anesthesia Plan: MAC   Post-op Pain Management:    Induction:   Airway Management Planned:   Additional Equipment:   Intra-op Plan:   Post-operative Plan:   Informed Consent: I have reviewed the patients History and Physical, chart, labs and discussed the procedure including the risks, benefits and alternatives for the proposed anesthesia with the patient or authorized representative who has indicated his/her understanding and acceptance.     Plan Discussed with: CRNA  Anesthesia Plan Comments:         Anesthesia Quick Evaluation

## 2016-11-06 NOTE — Anesthesia Procedure Notes (Signed)
Procedure Name: MAC Date/Time: 11/06/2016 7:45 AM Performed by: Hedda Slade Pre-anesthesia Checklist: Patient identified, Emergency Drugs available, Suction available and Patient being monitored Patient Re-evaluated:Patient Re-evaluated prior to inductionOxygen Delivery Method: Nasal cannula

## 2016-11-06 NOTE — Anesthesia Postprocedure Evaluation (Signed)
Anesthesia Post Note  Patient: Richard [name]  Procedure(s) Performed: Procedure(s) (LRB): CATARACT EXTRACTION PHACO AND INTRAOCULAR LENS PLACEMENT (IOC) (Left)  Patient location during evaluation: PACU Anesthesia Type: MAC Level of consciousness: awake, awake and alert and oriented Pain management: pain level controlled Vital Signs Assessment: post-procedure vital signs reviewed and stable Respiratory status: spontaneous breathing Cardiovascular status: blood pressure returned to baseline Postop Assessment: no headache, no backache and no signs of nausea or vomiting Anesthetic complications: no     Last Vitals:  Vitals:   11/06/16 0818 11/06/16 0820  BP: (!) 159/66 (!) 159/66  Pulse: 70 73  Resp: 16 12  Temp: 36.7 C 36.7 C    Last Pain:  Vitals:   11/06/16 0820  TempSrc: Oral                 Hedda Slade

## 2016-11-06 NOTE — Transfer of Care (Signed)
Immediate Anesthesia Transfer of Care Note  Patient: Richard [name]  Procedure(s) Performed: Procedure(s) with comments: CATARACT EXTRACTION PHACO AND INTRAOCULAR LENS PLACEMENT (IOC) (Left) - Lot # [id number] H [place]: 01:09.4 AP%:25.2 CDE: 30.64  Patient Location: PACU  Anesthesia Type:MAC  Level of Consciousness: awake, alert  and oriented  Airway & Oxygen Therapy: Patient Spontanous Breathing  Post-op Assessment: Report given to RN and Post -op Vital signs reviewed and stable  Post vital signs: Reviewed and stable  Last Vitals:  Vitals:   11/06/16 0622 11/06/16 0820  BP: 136/61 (!) 159/66  Pulse: (!) 58 73  Resp: 18 12  Temp: 36.6 C 36.7 C    Last Pain:  Vitals:   11/06/16 0820  TempSrc: Oral         Complications: No apparent anesthesia complications

## 2016-11-06 NOTE — Op Note (Signed)
Date of Surgery: 11/06/2016 Date of Dictation: 11/06/2016 8:17 AM Pre-operative Diagnosis:  Nuclear Sclerotic Cataract left Eye Post-operative Diagnosis: same Procedure performed: Extra-capsular Cataract Extraction (ECCE) with placement of a posterior chamber intraocular lens (IOL) left Eye IOL:  Implant Name Type Inv. Item Serial No. Manufacturer Lot No. LRB No. Used  LENS IOL ACRYSOF IQ 22.5 - [id number] Intraocular Lens LENS IOL ACRYSOF IQ 22.5 [id number] ALCON   Left 1   Anesthesia: 2% Lidocaine and 4% Marcaine in a 50/50 mixture with 10 unites/ml of Hylenex given as a peribulbar Anesthesiologist: Anesthesiologist: Gunnar Bulla, MD CRNA: Hedda Slade, CRNA Complications: none Estimated Blood Loss: less than 1 ml  Description of procedure:  The patient was given anesthesia and sedation via intravenous access. The patient was then prepped and draped in the usual fashion. A 25-gauge needle was bent for initiating the capsulorhexis. A 5-0 silk suture was placed through the conjunctiva superior and inferiorly to serve as bridle sutures. Hemostasis was obtained at the superior limbus using an eraser cautery. A partial thickness groove was made at the anterior surgical limbus with a 64 Beaver blade and this was dissected anteriorly with an [employer]. The anterior chamber was entered at 10 o'clock with a 1.0 mm paracentesis knife and through the lamellar dissection with a 2.6 mm Alcon keratome. Epi-Shugarcaine 0.5 CC [9 cc BSS Plus (Alcon), 3 cc 4% preservative-free lidocaine (Hospira) and 4 cc 1:1000 preservative-free, bisulfite-free epinephrine] was injected into the anterior chamber via the paracentesis tract. Epi-Shugarcaine 0.5 CC [9 cc BSS Plus (Alcon), 3 cc 4% preservative-free lidocaine (Hospira) and 4 cc 1:1000 preservative-free, bisulfite-free epinephrine] was injected into the anterior chamber via the paracentesis tract. DiscoVisc was injected to replace the aqueous and a  continuous tear curvilinear capsulorhexis was performed using a bent 25-gauge needle.  Balance salt on a syringe was used to perform hydro-dissection and phacoemulsification was carried out using a divide and conquer technique. Procedure(s) with comments: CATARACT EXTRACTION PHACO AND INTRAOCULAR LENS PLACEMENT (IOC) (Left) - Lot # [id number] H [place]: 01:09.4 AP%:25.2 CDE: 30.64. Irrigation/aspiration was used to remove the residual cortex and the capsular bag was inflated with DiscoVisc. The intraocular lens was inserted into the capsular bag using a pre-loaded UltraSert Delivery System. Irrigation/aspiration was used to remove the residual DiscoVisc. The wound was inflated with balanced salt and checked for leaks. None were found. Miostat was injected via the paracentesis track and 0.1 ml of cefuroxime containing 1 mg of drug  was injected via the paracentesis track. The wound was checked for leaks again and none were found.   The bridal sutures were removed and two drops of Vigamox were placed on the eye. An eye shield was placed to protect the eye and the patient was discharged to the recovery area in good condition.   Edelin Fryer MD

## 2016-11-06 NOTE — Anesthesia Post-op Follow-up Note (Cosign Needed)
Anesthesia QCDR form completed.        

## 2016-11-06 NOTE — Interval H&P Note (Signed)
History and Physical Interval Note:  11/06/2016 7:20 AM  Richard [name]  has presented today for surgery, with the diagnosis of CATARACT  The various methods of treatment have been discussed with the patient and family. After consideration of risks, benefits and other options for treatment, the patient has consented to  Procedure(s): CATARACT EXTRACTION PHACO AND INTRAOCULAR LENS PLACEMENT ([place]) (Left) as a surgical intervention .  The patient's history has been reviewed, patient examined, no change in status, stable for surgery.  I have reviewed the patient's chart and labs.  Questions were answered to the patient's satisfaction.     [name]

## 2016-11-07 ENCOUNTER — Other Ambulatory Visit: Payer: Self-pay | Admitting: Family Medicine

## 2016-11-07 NOTE — Telephone Encounter (Signed)
Faxed hardcopy for Freestyle lite test strips.

## 2016-11-21 ENCOUNTER — Encounter: Payer: Self-pay | Admitting: Family Medicine

## 2016-11-21 ENCOUNTER — Ambulatory Visit (INDEPENDENT_AMBULATORY_CARE_PROVIDER_SITE_OTHER): Payer: Medicare Other | Admitting: Family Medicine

## 2016-11-21 VITALS — BP 128/66 | HR 65 | Temp 97.8°F | Resp 16 | Ht 72.0 in | Wt 266.2 lb

## 2016-11-21 DIAGNOSIS — E1169 Type 2 diabetes mellitus with other specified complication: Secondary | ICD-10-CM

## 2016-11-21 DIAGNOSIS — R42 Dizziness and giddiness: Secondary | ICD-10-CM

## 2016-11-21 DIAGNOSIS — E785 Hyperlipidemia, unspecified: Secondary | ICD-10-CM | POA: Diagnosis not present

## 2016-11-21 DIAGNOSIS — E119 Type 2 diabetes mellitus without complications: Secondary | ICD-10-CM

## 2016-11-21 DIAGNOSIS — E669 Obesity, unspecified: Secondary | ICD-10-CM | POA: Diagnosis not present

## 2016-11-21 DIAGNOSIS — I10 Essential (primary) hypertension: Secondary | ICD-10-CM | POA: Diagnosis not present

## 2016-11-21 DIAGNOSIS — E782 Mixed hyperlipidemia: Secondary | ICD-10-CM | POA: Diagnosis not present

## 2016-11-21 LAB — COMPREHENSIVE METABOLIC PANEL
ALK PHOS: 121 U/L — AB (ref 39–117)
ALT: 18 U/L (ref 0–53)
AST: 21 U/L (ref 0–37)
Albumin: 4.6 g/dL (ref 3.5–5.2)
BILIRUBIN TOTAL: 0.7 mg/dL (ref 0.2–1.2)
BUN: 18 mg/dL (ref 6–23)
CALCIUM: 9.9 mg/dL (ref 8.4–10.5)
CO2: 31 mEq/L (ref 19–32)
CREATININE: 1.11 mg/dL (ref 0.40–1.50)
Chloride: 100 mEq/L (ref 96–112)
GFR: 68.04 mL/min (ref >60.00)
Glucose, Bld: 133 mg/dL — ABNORMAL HIGH (ref 70–99)
Potassium: 4 mEq/L (ref 3.5–5.1)
SODIUM: 138 meq/L (ref 135–145)
TOTAL PROTEIN: 7.6 g/dL (ref 6.0–8.3)

## 2016-11-21 LAB — LIPID PANEL
CHOLESTEROL: 125 mg/dL (ref 0–200)
HDL: 29.2 mg/dL — ABNORMAL LOW (ref >39.00)
LDL Cholesterol: 57 mg/dL (ref 0–99)
NonHDL: 95.83
TRIGLYCERIDES: 195 mg/dL — AB (ref 0.0–149.0)
Total CHOL/HDL Ratio: 4
VLDL: 39 mg/dL (ref 0.0–40.0)

## 2016-11-21 LAB — HEMOGLOBIN A1C: Hgb A1c MFr Bld: 7.2 % — ABNORMAL HIGH (ref 4.6–6.5)

## 2016-11-21 NOTE — Assessment & Plan Note (Signed)
Well controlled, no changes to meds. Encouraged heart healthy diet such as the DASH diet and exercise as tolerated.  °

## 2016-11-21 NOTE — Patient Instructions (Signed)
Carbohydrate Counting for Diabetes Mellitus, Adult Carbohydrate counting is a method for keeping track of how many carbohydrates you eat. Eating carbohydrates naturally increases the amount of sugar (glucose) in the blood. Counting how many carbohydrates you eat helps keep your blood glucose within normal limits, which helps you manage your diabetes (diabetes mellitus). It is important to know how many carbohydrates you can safely have in each meal. This is different for every person. A diet and nutrition specialist (registered dietitian) can help you make a meal plan and calculate how many carbohydrates you should have at each meal and snack. Carbohydrates are found in the following foods:  Grains, such as breads and cereals.  Dried beans and soy products.  Starchy vegetables, such as potatoes, peas, and corn.  Fruit and fruit juices.  Milk and yogurt.  Sweets and snack foods, such as cake, cookies, candy, chips, and soft drinks. How do I count carbohydrates? There are two ways to count carbohydrates in food. You can use either of the methods or a combination of both. Reading "Nutrition Facts" on packaged food  The "Nutrition Facts" list is included on the labels of almost all packaged foods and beverages in the [place] It includes:  The serving size.  Information about nutrients in each serving, including the grams (g) of carbohydrate per serving. To use the "Nutrition Facts":  Decide how many servings you will have.  Multiply the number of servings by the number of carbohydrates per serving.  The resulting number is the total amount of carbohydrates that you will be having. Learning standard serving sizes of other foods  When you eat foods containing carbohydrates that are not packaged or do not include "Nutrition Facts" on the label, you need to measure the servings in order to count the amount of carbohydrates:  Measure the foods that you will eat with a food scale or measuring  cup, if needed.  Decide how many standard-size servings you will eat.  Multiply the number of servings by 15. Most carbohydrate-rich foods have about 15 g of carbohydrates per serving.  For example, if you eat 8 oz (170 g) of strawberries, you will have eaten 2 servings and 30 g of carbohydrates (2 servings x 15 g = 30 g).  For foods that have more than one food mixed, such as soups and casseroles, you must count the carbohydrates in each food that is included. The following list contains standard serving sizes of common carbohydrate-rich foods. Each of these servings has about 15 g of carbohydrates:   hamburger bun or  English muffin.   oz (15 mL) syrup.   oz (14 g) jelly.  1 slice of bread.  1 six-inch tortilla.  3 oz (85 g) cooked rice or pasta.  4 oz (113 g) cooked dried beans.  4 oz (113 g) starchy vegetable, such as peas, corn, or potatoes.  4 oz (113 g) hot cereal.  4 oz (113 g) mashed potatoes or  of a large baked potato.  4 oz (113 g) canned or frozen fruit.  4 oz (120 mL) fruit juice.  4-6 crackers.  6 chicken nuggets.  6 oz (170 g) unsweetened dry cereal.  6 oz (170 g) plain fat-free yogurt or yogurt sweetened with artificial sweeteners.  8 oz (240 mL) milk.  8 oz (170 g) fresh fruit or one small piece of fruit.  24 oz (680 g) popped popcorn. Example of carbohydrate counting Sample meal  3 oz (85 g) chicken breast.  6 oz (  170 g) brown rice.  4 oz (113 g) corn.  8 oz (240 mL) milk.  8 oz (170 g) strawberries with sugar-free whipped topping. Carbohydrate calculation 1. Identify the foods that contain carbohydrates:  Rice.  Corn.  Milk.  Strawberries. 2. Calculate how many servings you have of each food:  2 servings rice.  1 serving corn.  1 serving milk.  1 serving strawberries. 3. Multiply each number of servings by 15 g:  2 servings rice x 15 g = 30 g.  1 serving corn x 15 g = 15 g.  1 serving milk x 15 g = 15  g.  1 serving strawberries x 15 g = 15 g. 4. Add together all of the amounts to find the total grams of carbohydrates eaten:  30 g + 15 g + 15 g + 15 g = 75 g of carbohydrates total. This information is not intended to replace advice given to you by your health care provider. Make sure you discuss any questions you have with your health care provider. Document Released: 06/17/2005 Document Revised: 01/05/2016 Document Reviewed: 11/29/2015 Elsevier Interactive Patient Education  2017 [employer]vier Inc.  

## 2016-11-21 NOTE — Assessment & Plan Note (Signed)
Tolerating statin, encouraged heart healthy diet, avoid trans fats, minimize simple carbs and saturated fats. Increase exercise as tolerated 

## 2016-11-21 NOTE — Assessment & Plan Note (Signed)
hgba1c to be checked, minimize simple carbs. Increase exercise as tolerated. Continue current meds  

## 2016-11-21 NOTE — Progress Notes (Signed)
Patient ID: Richard [name], male   DOB: 04-03-1941, 76 y.o.   MRN: [id number]     Subjective:  I acted as a Education administrator for Dr. Carollee Herter.  Richard [name], [name]   Patient ID: Richard [name], male    DOB: 1941/02/09, 76 y.o.   MRN: [id number]  Chief Complaint  Patient presents with  . Hypertension  . Diabetes    HPI  Patient is in today for follow up diabetes and blood pressure.  Sugars have been running 100-125 in the mornings.  Blood pressures have been doing well. 140-150 on the top numbers.  He thinks may need new machine.  It runs higher at home than it does at the doctors office.  HYPERTENSION   Blood pressure range-not checking   Chest pain- no      Dyspnea- no Lightheadedness- no   Edema- yes  Other side effects - no   Medication compliance: good Low salt diet- yes    DIABETES    Blood Sugar ranges-100-120  Polyuria- no New Visual problems- no  Hypoglycemic symptoms- no  Other side effects-no Medication compliance - good Last eye exam- recent cataract surgery  Foot exam- today   HYPERLIPIDEMIA  Medication compliance- good  RUQ pain- no  Muscle aches- no Other side effects-no   Patient Care Team: Richard Held, DO as PCP - General (Family Medicine) Richard [name], Richard Lipps, MD (Ophthalmology) Richard Noel, MD as Consulting Physician (Pulmonary Disease) Richard Napoleon, MD as Consulting Physician (Oncology) Richard Breeding, MD as Consulting Physician (Cardiology) Richard [name], Richard Mu, NP as Nurse Practitioner (Pulmonary Disease) Richard [name], Richard [name] Skin (Dermatology)   Past Medical History:  Diagnosis Date  . Adenomatous colon polyp 05/01/2015   tubular  . Anemia   . Antritis (stomach)    mild  . Arthritis    "knees, hips, shoulders" (03/15/2014)  . CAD (coronary artery disease)   . Chronic lower back pain   . Diverticulosis    mild, left colon  . Duodenitis    chronic  . Esophageal stricture    X 4  . GERD (gastroesophageal reflux disease)   . Gout   .  Heart murmur   . History of gout   . HNP (herniated nucleus pulposus), lumbar   . HTN (hypertension)   . Migraines    "til age 57 yrs"  . Mixed hyperlipidemia   . OSA on CPAP    with 2L O2 at night  . Peptic stricture of esophagus   . Renal insufficiency   . Skin cancer    "right neck cut off; several frozen off my arms" (03/15/2014)  . Sliding hiatal hernia   . Spinal stenosis   . Type II diabetes mellitus ([place])   . Vitamin B12 deficiency   . Wenckebach block    a. brady down to 30s due to frequent block, metoprolol [name] in response.    Past Surgical History:  Procedure Laterality Date  . APPENDECTOMY  ~ 1952  . BACK SURGERY    . CATARACT EXTRACTION W/PHACO Left 11/06/2016   Procedure: CATARACT EXTRACTION PHACO AND INTRAOCULAR LENS PLACEMENT (IOC);  Surgeon: Richard Cotta, MD;  Location: [facility] ORS;  Service: Ophthalmology;  Laterality: Left;  Lot # [id number] H [place]: 01:09.4 AP%:25.2 CDE: 30.64  . COLONOSCOPY W/ BIOPSIES AND POLYPECTOMY  2013  . CORONARY ANGIOPLASTY  1993  . CORONARY ANGIOPLASTY WITH STENT PLACEMENT  05/1997   "1"  . CORONARY ARTERY BYPASS GRAFT  03/2000   "CABG X5"  . ESOPHAGEAL DILATION  X 3-4   Dr. Lyla [name]; "last one was in the 1990's"  . ESOPHAGOGASTRODUODENOSCOPY     multiple  . FLEXIBLE SIGMOIDOSCOPY     multiple  . KNEE ARTHROSCOPY Left 2011   meniscus repair  . LEFT HEART CATHETERIZATION WITH CORONARY/GRAFT ANGIOGRAM N/A 03/16/2014   Procedure: LEFT HEART CATHETERIZATION WITH Beatrix Fetters;  Surgeon: Richard Blanks, MD;  Location: [name];  Service: Cardiovascular;  Laterality: N/A;  . LUMBAR LAMINECTOMY/DECOMPRESSION MICRODISCECTOMY Right 06/17/2013   Procedure: LUMBAR LAMINECTOMY MICRODISCECTOMY L4-L5 RIGHT EXCISION OF SYNOVIAL CYST RIGHT   (1 LEVEL) RIGHT PARTIAL FACETECTOMY;  Surgeon: Richard Bastos, MD;  Location: [facility];  Service: Orthopedics;  Laterality: Right;  . MYELOGRAM  04/06/13   lumbar, Dr Richard [name]  .  PANENDOSCOPY    . SHOULDER SURGERY Right 08/2010   screws placed; "tendons tore off"  . SKIN CANCER EXCISION Right    "neck"  . TONSILLECTOMY  ~ 1954  . UPPER GI ENDOSCOPY  2013   Gastritis; Dr Richard [name]  . VASECTOMY      Family History  Problem Relation Age of Onset  . Stroke Father   . Hypertension Father   . Pancreatic cancer Mother   . Diabetes Maternal Grandmother   . Stroke Maternal Grandmother   . Heart attack Paternal Grandmother   . Colon cancer Neg Hx   . Esophageal cancer Neg Hx   . Rectal cancer Neg Hx   . Stomach cancer Neg Hx   . Ulcers Neg Hx     Social History   Social History  . Marital status: Divorced    Spouse name: N/A  . Number of children: 2  . Years of education: N/A   Occupational History  .  Retired   Social History Main Topics  . Smoking status: Never Smoker  . Smokeless tobacco: Never Used  . Alcohol use No     Comment: "last drink was in 2012"( 03/15/2014)  . Drug use: No  . Sexual activity: Not Currently   Other Topics Concern  . Not on file   Social History Narrative   Divorced, lives with a roommate.   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    Outpatient Medications Prior to Visit  Medication Sig Dispense Refill  . allopurinol (ZYLOPRIM) 300 MG tablet Take 450 mg by mouth daily.     [name] amLODipine (NORVASC) 5 MG tablet TAKE 2 TABLETS (10 MG TOTAL) BY MOUTH DAILY. 60 tablet 11  . Ascorbic Acid (VITAMIN C) 500 MG tablet Take 500 mg by mouth every morning.     [name] aspirin 81 MG tablet Take 81 mg by mouth daily.    [name] atorvastatin (LIPITOR) 80 MG tablet TAKE ONE TABLET BY MOUTH AT BEDTIME 90 tablet 3  . esomeprazole (NEXIUM) 40 MG capsule Take 1 capsule (40 mg total) by mouth daily. 30 capsule 5  . ezetimibe (ZETIA) 10 MG tablet TAKE ONE TABLET BY MOUTH DAILY 30  tablet 4  . fenofibrate 160 MG tablet TAKE 1 TABLET BY MOUTH EVERY DAY 30 tablet 5  . fluticasone (FLONASE) 50 MCG/ACT nasal spray INHALE ONE SPRAY INTO EACH NOSTRIL TWICE DAILY. 1 g 6  . FREESTYLE LITE test strip TEST ONE TIME DAILY 100 each 0  . furosemide (LASIX) 40 MG tablet Take 2 tablets (  80 mg total) by mouth 2 (two) times daily. 360 tablet 3  . glimepiride (AMARYL) 2 MG tablet TAKE 1 TABLET (2 MG TOTAL) BY MOUTH DAILY WITH BREAKFAST. 90 tablet 0  . Lancets (FREESTYLE) lancets Use once daily to check blood sugar.  DX E11.9 100 each 6  . metFORMIN (GLUCOPHAGE) 1000 MG tablet TAKE ONE AND ONE-HALF TABLETS BY MOUTH EVERY MORNING AND 1 TALBET IN THE EVENING. 225 tablet 0  . metoprolol tartrate (LOPRESSOR) 25 MG tablet TAKE ONE-HALF TABLET BY MOUTH TWICE DAILY 30 tablet 3  . montelukast (SINGULAIR) 10 MG tablet Take 1 tablet (10 mg total) by mouth at bedtime. 90 tablet 1  . nitroGLYCERIN (NITROSTAT) 0.4 MG SL tablet Place 1 tablet (0.4 mg total) under the tongue every 5 (five) minutes as needed for chest pain. 25 tablet 3  . potassium chloride SA (K-DUR,KLOR-CON) 20 MEQ tablet TAKE ONE TABLET BY MOUTH TWICE DAILY 60 tablet 11  . pregabalin (LYRICA) 75 MG capsule Take 75 mg by mouth 2 (two) times daily.    . psyllium (HYDROCIL/METAMUCIL) 95 % PACK Take 1 packet by mouth 2 (two) times daily.     No facility-administered medications prior to visit.     Allergies  Allergen Reactions  . Benazepril     angioedema; he is not a candidate for any angiotensin receptor blockers because of this significant allergic reaction. Because of a history of documented adverse serious drug reaction;Medi Alert bracelet  is recommended  . Hctz [Hydrochlorothiazide] Swelling    Tongue and lip swelling   . Lactose Intolerance (Gi)   . Aspirin Other (See Comments)    Gastritis, cant take 325 Mg aspirin     Review of Systems  Constitutional: Negative for chills, fever and malaise/fatigue.  HENT: Negative for  congestion and hearing loss.   Eyes: Negative for blurred vision and discharge.  Respiratory: Negative for cough, sputum production and shortness of breath.   Cardiovascular: Negative for chest pain, palpitations and leg swelling.  Gastrointestinal: Negative for abdominal pain, blood in stool, constipation, diarrhea, heartburn, nausea and vomiting.  Genitourinary: Negative for dysuria, frequency, hematuria and urgency.  Musculoskeletal: Negative for back pain, falls and myalgias.  Skin: Negative for rash.  Neurological: Negative for dizziness, sensory change, loss of consciousness, weakness and headaches.  Endo/Heme/Allergies: Negative for environmental allergies. Does not bruise/bleed easily.  Psychiatric/Behavioral: Negative for depression and suicidal ideas. The patient is not nervous/anxious and does not have insomnia.        Objective:    Physical Exam  Constitutional: He is oriented to person, place, and time. Vital signs are normal. He appears well-developed and well-nourished. He is sleeping. No distress.  HENT:  Head: Normocephalic and atraumatic.  Mouth/Throat: Oropharynx is clear and moist.  Eyes: Conjunctivae and EOM are normal. Pupils are equal, round, and reactive to light.  Neck: Normal range of motion. Neck supple. No thyromegaly present.  Cardiovascular: Normal rate and regular rhythm.   Murmur heard. Pulmonary/Chest: Effort normal and breath sounds normal. No respiratory distress. He has no wheezes. He has no rales. He exhibits no tenderness.  Abdominal: Soft. Bowel sounds are normal. There is no tenderness.  Musculoskeletal: Normal range of motion. He exhibits no edema, tenderness or deformity.  Neurological: He is alert and oriented to person, place, and time.  Skin: Skin is warm and dry. He is not diaphoretic.  Psychiatric: He has a normal mood and affect. His behavior is normal. Judgment and thought content normal.  Nursing note and  vitals reviewed. Sensory exam  of the foot is normal, tested with the monofilament. Good pulses, no lesions or ulcers, good peripheral pulses.-- abrasion R second toe   BP 128/66 (BP Location: Left Arm, Cuff Size: Large)   Pulse 65   Temp 97.8 F (36.6 C) (Oral)   Resp 16   Ht 6' (1.829 m)   Wt 266 lb 3.2 oz (120.7 kg)   SpO2 95%   BMI 36.10 kg/m  Wt Readings from Last 3 Encounters:  11/21/16 266 lb 3.2 oz (120.7 kg)  11/06/16 263 lb (119.3 kg)  10/02/16 270 lb 1.9 oz (122.5 kg)   BP Readings from Last 3 Encounters:  11/21/16 128/66  11/06/16 (!) 159/76  10/30/16 (!) 113/50     Immunization History  Administered Date(s) Administered  . Influenza Split 06/12/2011, 05/31/2013  . Influenza Whole 05/01/2012  . Influenza, High Dose Seasonal PF 02/09/2015  . Influenza-Unspecified 02/11/2014, 02/29/2016  . Pneumococcal Conjugate-13 09/18/2015  . Pneumococcal Polysaccharide-23 06/20/2013  . Td 03/20/2010, 03/16/2013    Health Maintenance  Topic Date Due  . HEMOGLOBIN A1C  10/22/2016  . INFLUENZA VACCINE  01/29/2017  . URINE MICROALBUMIN  03/15/2017  . OPHTHALMOLOGY EXAM  10/16/2017  . FOOT EXAM  11/21/2017  . COLONOSCOPY  02/19/2018  . TETANUS/TDAP  03/17/2023  . PNA vac Low Risk Adult  Completed    Lab Results  Component Value Date   WBC 8.0 10/30/2016   HGB 13.6 10/30/2016   HCT 41.6 10/30/2016   PLT 205 10/30/2016   GLUCOSE 133 (H) 11/21/2016   CHOL 125 11/21/2016   TRIG 195.0 (H) 11/21/2016   HDL 29.20 (L) 11/21/2016   LDLDIRECT 71.0 09/18/2015   LDLCALC 57 11/21/2016   ALT 18 11/21/2016   AST 21 11/21/2016   NA 138 11/21/2016   K 4.0 11/21/2016   CL 100 11/21/2016   CREATININE 1.11 11/21/2016   BUN 18 11/21/2016   CO2 31 11/21/2016   TSH 2.53 07/28/2015   PSA 0.65 09/27/2013   INR 1.15 03/16/2014   HGBA1C 7.2 (H) 11/21/2016   MICROALBUR 0.4 03/15/2016    Lab Results  Component Value Date   TSH 2.53 07/28/2015   Lab Results  Component Value Date   WBC 8.0 10/30/2016   HGB  13.6 10/30/2016   HCT 41.6 10/30/2016   MCV 86 10/30/2016   PLT 205 10/30/2016   Lab Results  Component Value Date   NA 138 11/21/2016   K 4.0 11/21/2016   CO2 31 11/21/2016   GLUCOSE 133 (H) 11/21/2016   BUN 18 11/21/2016   CREATININE 1.11 11/21/2016   BILITOT 0.7 11/21/2016   ALKPHOS 121 (H) 11/21/2016   AST 21 11/21/2016   ALT 18 11/21/2016   PROT 7.6 11/21/2016   ALBUMIN 4.6 11/21/2016   CALCIUM 9.9 11/21/2016   ANIONGAP 10 10/06/2014   GFR 68.04 11/21/2016   Lab Results  Component Value Date   CHOL 125 11/21/2016   Lab Results  Component Value Date   HDL 29.20 (L) 11/21/2016   Lab Results  Component Value Date   LDLCALC 57 11/21/2016   Lab Results  Component Value Date   TRIG 195.0 (H) 11/21/2016   Lab Results  Component Value Date   CHOLHDL 4 11/21/2016   Lab Results  Component Value Date   HGBA1C 7.2 (H) 11/21/2016         Assessment & Plan:   Problem List Items Addressed This Visit      Unprioritized  Diabetes mellitus type 2 in obese ([place])    hgba1c to be checked, minimize simple carbs. Increase exercise as tolerated. Continue current meds      HYPERLIPIDEMIA, MIXED    Tolerating statin, encouraged heart healthy diet, avoid trans fats, minimize simple carbs and saturated fats. Increase exercise as tolerated      HYPERTENSION, BENIGN    Well controlled, no changes to meds. Encouraged heart healthy diet such as the DASH diet and exercise as tolerated.       Lightheadedness    Well controlled, no changes to meds. Encouraged heart healthy diet such as the DASH diet and exercise as tolerated.        Other Visit Diagnoses    Type 2 diabetes mellitus without complication, without long-term current use of insulin (HCC)    -  Primary   Relevant Orders   Hemoglobin A1c (Completed)   Comprehensive metabolic panel (Completed)   Hyperlipidemia, unspecified hyperlipidemia type       Relevant Orders   Comprehensive metabolic panel (Completed)    Lipid panel (Completed)   Essential hypertension          I am having Mr. Steiner maintain his allopurinol, vitamin C, esomeprazole, psyllium, aspirin, pregabalin, montelukast, furosemide, amLODipine, potassium chloride SA, atorvastatin, fluticasone, fenofibrate, ezetimibe, metFORMIN, nitroGLYCERIN, metoprolol tartrate, freestyle, FREESTYLE LITE, glimepiride, and DUREZOL.  Meds ordered this encounter  Medications  . DUREZOL 0.05 % EMUL    Sig: USE 1 DROP INTO RIGHT EYE TWICE A DAY AFTER PATCH IS REMOVED    Refill:  0    CMA served as scribe during this visit. History, Physical and Plan performed by medical provider. Documentation and orders reviewed and attested to.  Richard Held, DO

## 2016-11-23 ENCOUNTER — Other Ambulatory Visit: Payer: Self-pay | Admitting: Family Medicine

## 2016-11-27 ENCOUNTER — Ambulatory Visit: Payer: Medicare Other

## 2016-11-27 ENCOUNTER — Other Ambulatory Visit: Payer: Self-pay | Admitting: *Deleted

## 2016-11-27 ENCOUNTER — Other Ambulatory Visit: Payer: Medicare Other

## 2016-11-27 DIAGNOSIS — H2511 Age-related nuclear cataract, right eye: Secondary | ICD-10-CM | POA: Diagnosis not present

## 2016-11-27 DIAGNOSIS — I10 Essential (primary) hypertension: Secondary | ICD-10-CM

## 2016-11-27 DIAGNOSIS — E1169 Type 2 diabetes mellitus with other specified complication: Secondary | ICD-10-CM

## 2016-11-27 DIAGNOSIS — E782 Mixed hyperlipidemia: Secondary | ICD-10-CM

## 2016-11-27 DIAGNOSIS — E669 Obesity, unspecified: Secondary | ICD-10-CM

## 2016-12-02 ENCOUNTER — Encounter: Payer: Self-pay | Admitting: *Deleted

## 2016-12-02 ENCOUNTER — Ambulatory Visit (HOSPITAL_BASED_OUTPATIENT_CLINIC_OR_DEPARTMENT_OTHER): Payer: Medicare Other

## 2016-12-02 ENCOUNTER — Other Ambulatory Visit: Payer: Medicare Other

## 2016-12-02 ENCOUNTER — Other Ambulatory Visit (HOSPITAL_BASED_OUTPATIENT_CLINIC_OR_DEPARTMENT_OTHER): Payer: Medicare Other

## 2016-12-02 VITALS — BP 137/71 | HR 65 | Temp 97.9°F | Resp 18

## 2016-12-02 DIAGNOSIS — D500 Iron deficiency anemia secondary to blood loss (chronic): Secondary | ICD-10-CM | POA: Diagnosis not present

## 2016-12-02 DIAGNOSIS — D510 Vitamin B12 deficiency anemia due to intrinsic factor deficiency: Secondary | ICD-10-CM

## 2016-12-02 DIAGNOSIS — D508 Other iron deficiency anemias: Secondary | ICD-10-CM

## 2016-12-02 DIAGNOSIS — D519 Vitamin B12 deficiency anemia, unspecified: Secondary | ICD-10-CM

## 2016-12-02 LAB — CBC WITH DIFFERENTIAL (CANCER CENTER ONLY)
BASO#: 0 10*3/uL (ref 0.0–0.2)
BASO%: 0.4 % (ref 0.0–2.0)
EOS ABS: 0.2 10*3/uL (ref 0.0–0.5)
EOS%: 2.7 % (ref 0.0–7.0)
HEMATOCRIT: 41.2 % (ref 38.7–49.9)
HGB: 13.6 g/dL (ref 13.0–17.1)
LYMPH#: 1.5 10*3/uL (ref 0.9–3.3)
LYMPH%: 19.6 % (ref 14.0–48.0)
MCH: 28.2 pg (ref 28.0–33.4)
MCHC: 33 g/dL (ref 32.0–35.9)
MCV: 86 fL (ref 82–98)
MONO#: 0.7 10*3/uL (ref 0.1–0.9)
MONO%: 9.5 % (ref 0.0–13.0)
NEUT#: 5.3 10*3/uL (ref 1.5–6.5)
NEUT%: 67.8 % (ref 40.0–80.0)
PLATELETS: 220 10*3/uL (ref 145–400)
RBC: 4.82 10*6/uL (ref 4.20–5.70)
RDW: 15.6 % (ref 11.1–15.7)
WBC: 7.8 10*3/uL (ref 4.0–10.0)

## 2016-12-02 MED ORDER — CYANOCOBALAMIN 1000 MCG/ML IJ SOLN
1000.0000 ug | Freq: Once | INTRAMUSCULAR | Status: AC
  Administered 2016-12-02: 1000 ug via INTRAMUSCULAR

## 2016-12-02 MED ORDER — CYANOCOBALAMIN 1000 MCG/ML IJ SOLN
INTRAMUSCULAR | Status: AC
  Filled 2016-12-02: qty 1

## 2016-12-02 NOTE — Patient Instructions (Signed)
Cyanocobalamin, Vitamin B12 injection What is this medicine? CYANOCOBALAMIN (sye an oh koe BAL a min) is a man made form of vitamin B12. Vitamin B12 is used in the growth of healthy blood cells, nerve cells, and proteins in the body. It also helps with the metabolism of fats and carbohydrates. This medicine is used to treat people who can not absorb vitamin B12. This medicine may be used for other purposes; ask your health care provider or pharmacist if you have questions. COMMON BRAND NAME(S): B-12 Compliance Kit, B-12 Injection Kit, Cyomin, LA-12, Nutri-Twelve, Physicians EZ Use B-12, Primabalt What should I tell my health care provider before I take this medicine? They need to know if you have any of these conditions: -kidney disease -Leber's disease -megaloblastic anemia -an unusual or allergic reaction to cyanocobalamin, cobalt, other medicines, foods, dyes, or preservatives -pregnant or trying to get pregnant -breast-feeding How should I use this medicine? This medicine is injected into a muscle or deeply under the skin. It is usually given by a health care professional in a clinic or doctor's office. However, your doctor may teach you how to inject yourself. Follow all instructions. Talk to your pediatrician regarding the use of this medicine in children. Special care may be needed. Overdosage: If you think you have taken too much of this medicine contact a poison control center or emergency room at once. NOTE: This medicine is only for you. Do not share this medicine with others. What if I miss a dose? If you are given your dose at a clinic or doctor's office, call to reschedule your appointment. If you give your own injections and you miss a dose, take it as soon as you can. If it is almost time for your next dose, take only that dose. Do not take double or extra doses. What may interact with this medicine? -colchicine -heavy alcohol intake This list may not describe all possible  interactions. Give your health care provider a list of all the medicines, herbs, non-prescription drugs, or dietary supplements you use. Also tell them if you smoke, drink alcohol, or use illegal drugs. Some items may interact with your medicine. What should I watch for while using this medicine? Visit your doctor or health care professional regularly. You may need blood work done while you are taking this medicine. You may need to follow a special diet. Talk to your doctor. Limit your alcohol intake and avoid smoking to get the best benefit. What side effects may I notice from receiving this medicine? Side effects that you should report to your doctor or health care professional as soon as possible: -allergic reactions like skin rash, itching or hives, swelling of the face, lips, or tongue -blue tint to skin -chest tightness, pain -difficulty breathing, wheezing -dizziness -red, swollen painful area on the leg Side effects that usually do not require medical attention (report to your doctor or health care professional if they continue or are bothersome): -diarrhea -headache This list may not describe all possible side effects. Call your doctor for medical advice about side effects. You may report side effects to FDA at 1-800-FDA-1088. Where should I keep my medicine? Keep out of the reach of children. Store at room temperature between 15 and 30 degrees C (59 and 85 degrees F). Protect from light. Throw away any unused medicine after the expiration date. NOTE: This sheet is a summary. It may not cover all possible information. If you have questions about this medicine, talk to your doctor, pharmacist, or   health care provider.  2018 Elsevier/Gold Standard (2007-09-28 22:10:20)  

## 2016-12-02 NOTE — Pre-Procedure Instructions (Signed)
CLEARED BY DR [facility] 11/01/16

## 2016-12-03 LAB — VITAMIN B12: Vitamin B12: 219 pg/mL — ABNORMAL LOW (ref 232–1245)

## 2016-12-03 LAB — IRON AND TIBC
%SAT: 21 % (ref 20–55)
Iron: 62 ug/dL (ref 42–163)
TIBC: 298 ug/dL (ref 202–409)
UIBC: 236 ug/dL (ref 117–376)

## 2016-12-03 LAB — FERRITIN: FERRITIN: 312 ng/mL (ref 22–316)

## 2016-12-03 NOTE — H&P (Signed)
See scanned note.

## 2016-12-04 ENCOUNTER — Ambulatory Visit: Payer: Medicare Other | Admitting: Anesthesiology

## 2016-12-04 ENCOUNTER — Encounter
Admission: RE | Disposition: A | Discharge status: Home / Self Care | Payer: Self-pay | Source: Ambulatory Visit | Attending: Ophthalmology

## 2016-12-04 ENCOUNTER — Encounter: Payer: Self-pay | Admitting: *Deleted

## 2016-12-04 ENCOUNTER — Ambulatory Visit
Admission: RE | Admit: 2016-12-04 | Discharge: 2016-12-04 | Disposition: A | Discharge status: Home / Self Care | Payer: Medicare Other | Source: Ambulatory Visit | Attending: Ophthalmology | Admitting: Ophthalmology

## 2016-12-04 DIAGNOSIS — E119 Type 2 diabetes mellitus without complications: Secondary | ICD-10-CM | POA: Diagnosis not present

## 2016-12-04 DIAGNOSIS — M1990 Unspecified osteoarthritis, unspecified site: Secondary | ICD-10-CM | POA: Insufficient documentation

## 2016-12-04 DIAGNOSIS — Z7984 Long term (current) use of oral hypoglycemic drugs: Secondary | ICD-10-CM | POA: Insufficient documentation

## 2016-12-04 DIAGNOSIS — I2510 Atherosclerotic heart disease of native coronary artery without angina pectoris: Secondary | ICD-10-CM | POA: Diagnosis not present

## 2016-12-04 DIAGNOSIS — Z79899 Other long term (current) drug therapy: Secondary | ICD-10-CM | POA: Diagnosis not present

## 2016-12-04 DIAGNOSIS — Z6836 Body mass index (BMI) 36.0-36.9, adult: Secondary | ICD-10-CM | POA: Insufficient documentation

## 2016-12-04 DIAGNOSIS — Z951 Presence of aortocoronary bypass graft: Secondary | ICD-10-CM | POA: Insufficient documentation

## 2016-12-04 DIAGNOSIS — G4730 Sleep apnea, unspecified: Secondary | ICD-10-CM | POA: Insufficient documentation

## 2016-12-04 DIAGNOSIS — R011 Cardiac murmur, unspecified: Secondary | ICD-10-CM | POA: Insufficient documentation

## 2016-12-04 DIAGNOSIS — H2511 Age-related nuclear cataract, right eye: Secondary | ICD-10-CM | POA: Diagnosis not present

## 2016-12-04 DIAGNOSIS — E7800 Pure hypercholesterolemia, unspecified: Secondary | ICD-10-CM | POA: Insufficient documentation

## 2016-12-04 DIAGNOSIS — Z7982 Long term (current) use of aspirin: Secondary | ICD-10-CM | POA: Diagnosis not present

## 2016-12-04 DIAGNOSIS — I10 Essential (primary) hypertension: Secondary | ICD-10-CM | POA: Insufficient documentation

## 2016-12-04 DIAGNOSIS — E669 Obesity, unspecified: Secondary | ICD-10-CM | POA: Insufficient documentation

## 2016-12-04 DIAGNOSIS — Z888 Allergy status to other drugs, medicaments and biological substances status: Secondary | ICD-10-CM | POA: Diagnosis not present

## 2016-12-04 DIAGNOSIS — K219 Gastro-esophageal reflux disease without esophagitis: Secondary | ICD-10-CM | POA: Insufficient documentation

## 2016-12-04 DIAGNOSIS — R001 Bradycardia, unspecified: Secondary | ICD-10-CM | POA: Diagnosis not present

## 2016-12-04 DIAGNOSIS — Z955 Presence of coronary angioplasty implant and graft: Secondary | ICD-10-CM | POA: Insufficient documentation

## 2016-12-04 HISTORY — PX: CATARACT EXTRACTION W/PHACO: SHX586

## 2016-12-04 LAB — GLUCOSE, CAPILLARY: Glucose-Capillary: 167 mg/dL — ABNORMAL HIGH (ref 65–99)

## 2016-12-04 SURGERY — PHACOEMULSIFICATION, CATARACT, WITH IOL INSERTION
Anesthesia: Monitor Anesthesia Care | Site: Eye | Laterality: Right | Wound class: Clean

## 2016-12-04 MED ORDER — HYALURONIDASE HUMAN 150 UNIT/ML IJ SOLN
INTRAMUSCULAR | Status: AC
  Filled 2016-12-04: qty 1

## 2016-12-04 MED ORDER — ALFENTANIL 500 MCG/ML IJ INJ
INJECTION | INTRAVENOUS | Status: AC
  Filled 2016-12-04: qty 5

## 2016-12-04 MED ORDER — SODIUM CHLORIDE 0.9 % IV SOLN
INTRAVENOUS | Status: DC
  Administered 2016-12-04: 07:00:00 via INTRAVENOUS

## 2016-12-04 MED ORDER — MOXIFLOXACIN HCL 0.5 % OP SOLN
OPHTHALMIC | Status: AC
  Filled 2016-12-04: qty 3

## 2016-12-04 MED ORDER — NA CHONDROIT SULF-NA HYALURON 40-17 MG/ML IO SOLN
INTRAOCULAR | Status: AC
  Filled 2016-12-04: qty 1

## 2016-12-04 MED ORDER — PHENYLEPHRINE HCL 10 % OP SOLN
OPHTHALMIC | Status: AC
  Filled 2016-12-04: qty 5

## 2016-12-04 MED ORDER — BSS IO SOLN
INTRAOCULAR | Status: DC | PRN
  Administered 2016-12-04: 09:00:00 via OPHTHALMIC

## 2016-12-04 MED ORDER — LIDOCAINE HCL (PF) 2 % IJ SOLN
INTRAMUSCULAR | Status: AC
  Filled 2016-12-04: qty 2

## 2016-12-04 MED ORDER — MOXIFLOXACIN HCL 0.5 % OP SOLN
OPHTHALMIC | Status: DC | PRN
  Administered 2016-12-04: 0.2 mL via OPHTHALMIC

## 2016-12-04 MED ORDER — MOXIFLOXACIN HCL 0.5 % OP SOLN
1.0000 [drp] | OPHTHALMIC | Status: DC
  Administered 2016-12-04 (×3): 1 [drp] via OPHTHALMIC

## 2016-12-04 MED ORDER — CYCLOPENTOLATE HCL 2 % OP SOLN
1.0000 [drp] | OPHTHALMIC | Status: AC
  Administered 2016-12-04 (×4): 1 [drp] via OPHTHALMIC

## 2016-12-04 MED ORDER — ARMC OPHTHALMIC DILATING DROPS
OPHTHALMIC | Status: DC
Start: 2016-12-04 — End: 2016-12-04
  Filled 2016-12-04: qty 0.4

## 2016-12-04 MED ORDER — LIDOCAINE HCL (PF) 4 % IJ SOLN
INTRAOCULAR | Status: DC | PRN
  Administered 2016-12-04: 4 mL via OPHTHALMIC

## 2016-12-04 MED ORDER — CEFUROXIME OPHTHALMIC INJECTION 1 MG/0.1 ML
INJECTION | OPHTHALMIC | Status: AC
  Filled 2016-12-04: qty 0.1

## 2016-12-04 MED ORDER — TETRACAINE HCL 0.5 % OP SOLN
OPHTHALMIC | Status: DC | PRN
  Administered 2016-12-04: 1 [drp] via OPHTHALMIC

## 2016-12-04 MED ORDER — POVIDONE-IODINE 5 % OP SOLN
OPHTHALMIC | Status: AC
  Filled 2016-12-04: qty 30

## 2016-12-04 MED ORDER — CARBACHOL 0.01 % IO SOLN
INTRAOCULAR | Status: DC | PRN
  Administered 2016-12-04: 0.5 mL via INTRAOCULAR

## 2016-12-04 MED ORDER — PHENYLEPHRINE HCL 10 % OP SOLN
1.0000 [drp] | OPHTHALMIC | Status: AC
  Administered 2016-12-04 (×4): 1 [drp] via OPHTHALMIC

## 2016-12-04 MED ORDER — POVIDONE-IODINE 5 % OP SOLN
OPHTHALMIC | Status: DC | PRN
  Administered 2016-12-04: 1 via OPHTHALMIC

## 2016-12-04 MED ORDER — NA CHONDROIT SULF-NA HYALURON 40-17 MG/ML IO SOLN
INTRAOCULAR | Status: DC | PRN
  Administered 2016-12-04: 1 mL via INTRAOCULAR

## 2016-12-04 MED ORDER — MIDAZOLAM HCL 2 MG/2ML IJ SOLN
INTRAMUSCULAR | Status: AC
  Filled 2016-12-04: qty 2

## 2016-12-04 MED ORDER — BUPIVACAINE HCL (PF) 0.75 % IJ SOLN
INTRAMUSCULAR | Status: DC | PRN
  Administered 2016-12-04: 09:00:00 via OPHTHALMIC

## 2016-12-04 MED ORDER — CEFUROXIME OPHTHALMIC INJECTION 1 MG/0.1 ML
INJECTION | OPHTHALMIC | Status: DC | PRN
  Administered 2016-12-04: 1 mg via INTRACAMERAL

## 2016-12-04 MED ORDER — TETRACAINE HCL 0.5 % OP SOLN
OPHTHALMIC | Status: AC
  Filled 2016-12-04: qty 2

## 2016-12-04 MED ORDER — ALFENTANIL 500 MCG/ML IJ INJ
INJECTION | INTRAVENOUS | Status: DC | PRN
  Administered 2016-12-04: 500 ug via INTRAVENOUS

## 2016-12-04 MED ORDER — CYCLOPENTOLATE HCL 2 % OP SOLN
OPHTHALMIC | Status: AC
  Filled 2016-12-04: qty 2

## 2016-12-04 MED ORDER — EPINEPHRINE PF 1 MG/ML IJ SOLN
INTRAMUSCULAR | Status: AC
  Filled 2016-12-04: qty 2

## 2016-12-04 MED ORDER — MIDAZOLAM HCL 2 MG/2ML IJ SOLN
INTRAMUSCULAR | Status: DC | PRN
  Administered 2016-12-04: 1 mg via INTRAVENOUS

## 2016-12-04 MED ORDER — BUPIVACAINE HCL (PF) 0.75 % IJ SOLN
INTRAMUSCULAR | Status: AC
  Filled 2016-12-04: qty 10

## 2016-12-04 SURGICAL SUPPLY — 23 items
CORD BIP STRL DISP 12FT (MISCELLANEOUS) ×3 IMPLANT
DRAPE XRAY CASSETTE 23X24 (DRAPES) ×3 IMPLANT
ERASER HMR WETFIELD 18G (MISCELLANEOUS) ×3 IMPLANT
GLOVE BIO SURGEON STRL SZ8 (GLOVE) ×3 IMPLANT
GLOVE SURG LX 6.5 MICRO (GLOVE) ×2
GLOVE SURG LX 8.0 MICRO (GLOVE) ×2
GLOVE SURG LX STRL 6.5 MICRO (GLOVE) ×1 IMPLANT
GLOVE SURG LX STRL 8.0 MICRO (GLOVE) ×1 IMPLANT
GOWN STRL REUS W/ TWL LRG LVL3 (GOWN DISPOSABLE) ×1 IMPLANT
GOWN STRL REUS W/ TWL XL LVL3 (GOWN DISPOSABLE) ×1 IMPLANT
GOWN STRL REUS W/TWL LRG LVL3 (GOWN DISPOSABLE) ×3
GOWN STRL REUS W/TWL XL LVL3 (GOWN DISPOSABLE) ×3
LENS IOL ACRYSOF IQ 22.5 (Intraocular Lens) ×2 IMPLANT
PACK CATARACT (MISCELLANEOUS) ×3 IMPLANT
PACK CATARACT DINGLEDEIN LX (MISCELLANEOUS) ×3 IMPLANT
PACK EYE AFTER SURG (MISCELLANEOUS) ×3 IMPLANT
SHLD EYE VISITEC  UNIV (MISCELLANEOUS) ×3 IMPLANT
SOL BSS BAG (MISCELLANEOUS) ×3
SOLUTION BSS BAG (MISCELLANEOUS) ×1 IMPLANT
SUT SILK 5-0 (SUTURE) ×3 IMPLANT
SYR 5ML LL (SYRINGE) ×3 IMPLANT
WATER STERILE IRR 250ML POUR (IV SOLUTION) ×3 IMPLANT
WIPE NON LINTING 3.25X3.25 (MISCELLANEOUS) ×3 IMPLANT

## 2016-12-04 NOTE — Interval H&P Note (Signed)
History and Physical Interval Note:  12/04/2016 7:24 AM  Richard [name]  has presented today for surgery, with the diagnosis of NUCLEAR SCLEROTIC CATARACT RIGHT EYE  The various methods of treatment have been discussed with the patient and family. After consideration of risks, benefits and other options for treatment, the patient has consented to  Procedure(s) with comments: CATARACT EXTRACTION PHACO AND INTRAOCULAR LENS PLACEMENT (IOC) (Right) - [place] AP% CDE Fluid Pack lot # [id number] as a surgical intervention .  The patient's history has been reviewed, patient examined, no change in status, stable for surgery.  I have reviewed the patient's chart and labs.  Questions were answered to the patient's satisfaction.     [name]

## 2016-12-04 NOTE — Anesthesia Preprocedure Evaluation (Signed)
Anesthesia Evaluation  Patient identified by MRN, date of birth, ID band Patient awake    Reviewed: Allergy & Precautions, NPO status , Patient's Chart, lab work & pertinent test results  Airway Mallampati: II       Dental  (+) Teeth Intact   Pulmonary sleep apnea and Continuous Positive Airway Pressure Ventilation ,     + decreased breath sounds      Cardiovascular Exercise Tolerance: Good hypertension, Pt. on home beta blockers + CAD and + CABG   Rhythm:Regular     Neuro/Psych  Headaches,    GI/Hepatic Neg liver ROS, hiatal hernia, GERD  Medicated,  Endo/Other  diabetes, Type 2, Oral Hypoglycemic Agents  Renal/GU      Musculoskeletal   Abdominal (+) + obese,   Peds negative pediatric ROS (+)  Hematology  (+) anemia ,   Anesthesia Other Findings   Reproductive/Obstetrics                             Anesthesia Physical Anesthesia Plan  ASA: III  Anesthesia Plan: MAC   Post-op Pain Management:    Induction: Intravenous  PONV Risk Score and Plan: 0  Airway Management Planned: Natural Airway  Additional Equipment:   Intra-op Plan:   Post-operative Plan:   Informed Consent: I have reviewed the patients History and Physical, chart, labs and discussed the procedure including the risks, benefits and alternatives for the proposed anesthesia with the patient or authorized representative who has indicated his/her understanding and acceptance.     Plan Discussed with: CRNA  Anesthesia Plan Comments:         Anesthesia Quick Evaluation

## 2016-12-04 NOTE — Op Note (Signed)
Date of Surgery: 12/04/2016 Date of Dictation: 12/04/2016 9:00 AM Pre-operative Diagnosis:  Nuclear Sclerotic Cataract right Eye Post-operative Diagnosis: same Procedure performed: Extra-capsular Cataract Extraction (ECCE) with placement of a posterior chamber intraocular lens (IOL) right Eye IOL:  Implant Name Type Inv. Item Serial No. Manufacturer Lot No. LRB No. Used  LENS IOL ACRYSOF IQ 22.5 - [id number] Intraocular Lens LENS IOL ACRYSOF IQ 22.5 [id number] ALCON   Right 1   Anesthesia: 2% Lidocaine and 4% Marcaine in a 50/50 mixture with 10 unites/ml of Hylenex given as a peribulbar Anesthesiologist: Anesthesiologist: [place] [facility], Jane Canary, MD CRNA: Aline Brochure, CRNA Complications: none Estimated Blood Loss: less than 1 ml  Description of procedure:  The patient was given anesthesia and sedation via intravenous access. The patient was then prepped and draped in the usual fashion. A 25-gauge needle was bent for initiating the capsulorhexis. A 5-0 silk suture was placed through the conjunctiva superior and inferiorly to serve as bridle sutures. Hemostasis was obtained at the superior limbus using an eraser cautery. A partial thickness groove was made at the anterior surgical limbus with a 64 Beaver blade and this was dissected anteriorly with an Avaya. The anterior chamber was entered at 10 o'clock with a 1.0 mm paracentesis knife and through the lamellar dissection with a 2.6 mm Alcon keratome. Epi-Shugarcaine 0.5 CC [9 cc BSS Plus (Alcon), 3 cc 4% preservative-free lidocaine (Hospira) and 4 cc 1:1000 preservative-free, bisulfite-free epinephrine] was injected into the anterior chamber via the paracentesis tract. Epi-Shugarcaine 0.5 CC [9 cc BSS Plus (Alcon), 3 cc 4% preservative-free lidocaine (Hospira) and 4 cc 1:1000 preservative-free, bisulfite-free epinephrine] was injected into the anterior chamber via the paracentesis tract. DiscoVisc was injected to replace the  aqueous and a continuous tear curvilinear capsulorhexis was performed using a bent 25-gauge needle.  Balance salt on a syringe was used to perform hydro-dissection and phacoemulsification was carried out using a divide and conquer technique. Procedure(s) with comments: CATARACT EXTRACTION PHACO AND INTRAOCULAR LENS PLACEMENT (IOC) (Right) - [place] 1:25.9 AP% 24.1 CDE 39.10 Fluid Pack lot # [id number]. Irrigation/aspiration was used to remove the residual cortex and the capsular bag was inflated with DiscoVisc. The intraocular lens was inserted into the capsular bag using a pre-loaded UltraSert Delivery System. Irrigation/aspiration was used to remove the residual DiscoVisc. The wound was inflated with balanced salt and checked for leaks. None were found. Miostat was injected via the paracentesis track and 0.1 ml of cefuroxime containing 1 mg of drug  was injected via the paracentesis track. The wound was checked for leaks again and none were found.   The bridal sutures were removed and two drops of Vigamox were placed on the eye. An eye shield was placed to protect the eye and the patient was discharged to the recovery area in good condition.   Roshawn Lacina MD

## 2016-12-04 NOTE — Discharge Instructions (Addendum)
Eye Surgery Discharge Instructions  Expect mild scratchy sensation or mild soreness. DO NOT RUB YOUR EYE!  The day of surgery:  Minimal physical activity, but bed rest is not required  No reading, computer work, or close hand work  No bending, lifting, or straining.  May watch TV  For 24 hours:  No driving, legal decisions, or alcoholic beverages  Safety precautions  Eat anything you prefer: It is better to start with liquids, then soup then solid foods.  _____ Eye patch should be worn until postoperative exam tomorrow.  ____ Solar shield eyeglasses should be worn for comfort in the sunlight/patch while sleeping  Resume all regular medications including aspirin or Coumadin if these were discontinued prior to surgery. You may shower, bathe, shave, or wash your hair. Tylenol may be taken for mild discomfort.  Call your doctor if you experience significant pain, nausea, or vomiting, fever > 101 or other signs of infection. [phone] or [phone] Specific instructions:  Follow-up Information    Addelynn Batte, Remo Lipps, MD Follow up.   Specialty:  Ophthalmology Why:  Thursday 12/05/16 @ 10:40 am Contact information: [address]   [place] [address] [phone]

## 2016-12-04 NOTE — Anesthesia Postprocedure Evaluation (Signed)
Anesthesia Post Note  Patient: Richard [name]  Procedure(s) Performed: Procedure(s) (LRB): CATARACT EXTRACTION PHACO AND INTRAOCULAR LENS PLACEMENT (IOC) (Right)  Patient location during evaluation: Short Stay Anesthesia Type: MAC Level of consciousness: awake and alert Pain management: pain level controlled Vital Signs Assessment: post-procedure vital signs reviewed and stable Respiratory status: spontaneous breathing, nonlabored ventilation, respiratory function stable and patient connected to nasal cannula oxygen Cardiovascular status: stable and blood pressure returned to baseline Anesthetic complications: no     Last Vitals:  Vitals:   12/04/16 0654 12/04/16 0901  BP: (!) 155/74 139/67  Pulse: 64 74  Resp: 18 18  Temp: 36.5 C 36.7 C    Last Pain:  Vitals:   12/04/16 0901  TempSrc: Oral                 Estill Batten

## 2016-12-04 NOTE — Anesthesia Post-op Follow-up Note (Cosign Needed)
Anesthesia QCDR form completed.        

## 2016-12-04 NOTE — Transfer of Care (Signed)
Immediate Anesthesia Transfer of Care Note  Patient: Richard [name]  Procedure(s) Performed: Procedure(s) with comments: CATARACT EXTRACTION PHACO AND INTRAOCULAR LENS PLACEMENT ([place]) (Right) - [place] 1:25.9 AP% 24.1 CDE 39.10 Fluid Pack lot # [id number]  Patient Location: Short Stay  Anesthesia Type:MAC  Level of Consciousness: awake, alert  and oriented  Airway & Oxygen Therapy: Patient Spontanous Breathing  Post-op Assessment: Report given to RN  Post vital signs: stable  Last Vitals:  Vitals:   12/04/16 0654 12/04/16 0901  BP: (!) 155/74 139/67  Pulse: 64 74  Resp: 18 18  Temp: 36.5 C 36.7 C    Last Pain:  Vitals:   12/04/16 0901  TempSrc: Oral         Complications: No apparent anesthesia complications

## 2016-12-16 ENCOUNTER — Ambulatory Visit (INDEPENDENT_AMBULATORY_CARE_PROVIDER_SITE_OTHER): Payer: Medicare Other | Admitting: Family Medicine

## 2016-12-16 ENCOUNTER — Encounter: Payer: Self-pay | Admitting: Family Medicine

## 2016-12-16 VITALS — BP 138/60 | HR 85 | Temp 98.2°F | Resp 16 | Ht 72.0 in | Wt 263.4 lb

## 2016-12-16 DIAGNOSIS — S161XXA Strain of muscle, fascia and tendon at neck level, initial encounter: Secondary | ICD-10-CM

## 2016-12-16 MED ORDER — TIZANIDINE HCL 4 MG PO TABS: 4.0000 mg | ORAL_TABLET | Freq: Four times a day (QID) | ORAL | 1 refills | Status: DC | PRN

## 2016-12-16 NOTE — Progress Notes (Signed)
Patient ID: Richard [name], male   DOB: 10-15-1940, 76 y.o.   MRN: [id number]     Subjective:  I acted as a Education administrator for Dr. Carollee Herter.  Richard [name], Richard [name]   Patient ID: Richard [name], male    DOB: 1940/10/01, 76 y.o.   MRN: [id number]  Chief Complaint  Patient presents with  . Neck Pain    Neck Pain   This is a new problem. Episode onset: 11/22/16. The problem occurs constantly. The pain is present in the right side. The quality of the pain is described as shooting. Pain scale: 8-9. The symptoms are aggravated by twisting. Pertinent negatives include no chest pain, fever or headaches. He has tried acetaminophen and NSAIDs for the symptoms.    Patient is in today for crook in right side of neck.  Started after he got shingles shot on 11/21/16.  Then since last Thursday he has been in severe pain.  Can hardly move neck.  He thinks it may be a side effect to shingle vaccine.  Patient Care Team: Carollee Herter, Alferd Apa, DO as PCP - General (Family Medicine) Dingeldein, Remo Lipps, MD (Ophthalmology) Rigoberto Noel, MD as Consulting Physician (Pulmonary Disease) [name], MD as Consulting Physician (Oncology) Minus Breeding, MD as Consulting Physician (Cardiology) Parrett, Fonnie Mu, NP as Nurse Practitioner (Pulmonary Disease) [facility] (Dermatology)   Past Medical History:  Diagnosis Date  . Adenomatous colon polyp 05/01/2015   tubular  . Anemia   . Antritis (stomach)    mild  . Arthritis    "knees, hips, shoulders" (03/15/2014)  . CAD (coronary artery disease)   . Chronic lower back pain   . Diverticulosis    mild, left colon  . Duodenitis    chronic  . Esophageal stricture    X 4  . GERD (gastroesophageal reflux disease)   . Gout   . Heart murmur   . History of gout   . HNP (herniated nucleus pulposus), lumbar   . HTN (hypertension)   . Migraines    "til age 59 yrs"  . Mixed hyperlipidemia   . OSA on CPAP    with 2L O2 at night  . Peptic stricture of  esophagus   . Renal insufficiency   . Skin cancer    "right neck cut off; several frozen off my arms" (03/15/2014)  . Sliding hiatal hernia   . Spinal stenosis   . Type II diabetes mellitus ([place])   . Vitamin B12 deficiency   . Wenckebach block    a. brady down to 30s due to frequent block, metoprolol held in response.    Past Surgical History:  Procedure Laterality Date  . APPENDECTOMY  ~ 1952  . BACK SURGERY    . CATARACT EXTRACTION W/PHACO Left 11/06/2016   Procedure: CATARACT EXTRACTION PHACO AND INTRAOCULAR LENS PLACEMENT (IOC);  Surgeon: Estill Cotta, MD;  Location: [facility] ORS;  Service: Ophthalmology;  Laterality: Left;  Lot # [id number] H [place]: 01:09.4 AP%:25.2 CDE: 30.64  . CATARACT EXTRACTION W/PHACO Right 12/04/2016   Procedure: CATARACT EXTRACTION PHACO AND INTRAOCULAR LENS PLACEMENT (IOC);  Surgeon: Estill Cotta, MD;  Location: [facility] ORS;  Service: Ophthalmology;  Laterality: Right;  [place] 1:25.9 AP% 24.1 CDE 39.10 Fluid Pack lot # [id number]  . COLONOSCOPY W/ BIOPSIES AND POLYPECTOMY  2013  . CORONARY ANGIOPLASTY  1993  . CORONARY ANGIOPLASTY WITH STENT PLACEMENT  05/1997   "1"  . CORONARY ARTERY BYPASS GRAFT  03/2000   "CABG X5"  . ESOPHAGEAL  DILATION  X 3-4   Dr. Lyla Son; "last one was in the 1990's"  . ESOPHAGOGASTRODUODENOSCOPY     multiple  . FLEXIBLE SIGMOIDOSCOPY     multiple  . KNEE ARTHROSCOPY Left 2011   meniscus repair  . LEFT HEART CATHETERIZATION WITH CORONARY/GRAFT ANGIOGRAM N/A 03/16/2014   Procedure: LEFT HEART CATHETERIZATION WITH Beatrix Fetters;  Surgeon: Burnell Blanks, MD;  Location: [facility] CATH LAB;  Service: Cardiovascular;  Laterality: N/A;  . LUMBAR LAMINECTOMY/DECOMPRESSION MICRODISCECTOMY Right 06/17/2013   Procedure: LUMBAR LAMINECTOMY MICRODISCECTOMY L4-L5 RIGHT EXCISION OF SYNOVIAL CYST RIGHT   (1 LEVEL) RIGHT PARTIAL FACETECTOMY;  Surgeon: Tobi Bastos, MD;  Location: [facility] ORS;  Service: Orthopedics;  Laterality: Right;    . MYELOGRAM  04/06/13   lumbar, Dr Gladstone Lighter  . PANENDOSCOPY    . SHOULDER SURGERY Right 08/2010   screws placed; "tendons tore off"  . SKIN CANCER EXCISION Right    "neck"  . TONSILLECTOMY  ~ 1954  . UPPER GI ENDOSCOPY  2013   Gastritis; Dr Carlean Purl  . VASECTOMY      Family History  Problem Relation Age of Onset  . Stroke Father   . Hypertension Father   . Pancreatic cancer Mother   . Diabetes Maternal Grandmother   . Stroke Maternal Grandmother   . Heart attack Paternal Grandmother   . Colon cancer Neg Hx   . Esophageal cancer Neg Hx   . Rectal cancer Neg Hx   . Stomach cancer Neg Hx   . Ulcers Neg Hx     Social History   Social History  . Marital status: Divorced    Spouse name: N/A  . Number of children: 2  . Years of education: N/A   Occupational History  .  Retired   Social History Main Topics  . Smoking status: Never Smoker  . Smokeless tobacco: Never Used  . Alcohol use No     Comment: "last drink was in 2012"( 03/15/2014)  . Drug use: No  . Sexual activity: Not Currently   Other Topics Concern  . Not on file   Social History Narrative   Divorced, lives with a roommate.   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    Outpatient Medications Prior to Visit  Medication Sig Dispense Refill  . allopurinol (ZYLOPRIM) 300 MG tablet Take 450 mg by mouth daily.     [name] amLODipine (NORVASC) 5 MG tablet TAKE 2 TABLETS (10 MG TOTAL) BY MOUTH DAILY. 60 tablet 11  . aspirin 81 MG tablet Take 81 mg by mouth daily.    [name] atorvastatin (LIPITOR) 80 MG tablet TAKE ONE TABLET BY MOUTH AT BEDTIME 90 tablet 3  . DUREZOL 0.05 % EMUL USE 1 DROP INTO RIGHT EYE TWICE A DAY AFTER PATCH IS REMOVED  0  . esomeprazole (NEXIUM) 40 MG capsule Take 1 capsule (40 mg total) by mouth daily. 30 capsule 5  . ezetimibe  (ZETIA) 10 MG tablet TAKE ONE TABLET BY MOUTH DAILY 30 tablet 4  . fenofibrate 160 MG tablet TAKE 1 TABLET BY MOUTH EVERY DAY 30 tablet 5  . fluticasone (FLONASE) 50 MCG/ACT nasal spray INHALE ONE SPRAY INTO EACH NOSTRIL TWICE DAILY. (Patient taking differently: INHALE ONE SPRAY INTO EACH NOSTRIL TWICE DAILY AS NEEDED FOR ALLERGIES) 1 g 6  . FREESTYLE  LITE test strip TEST ONE TIME DAILY 100 each 0  . furosemide (LASIX) 40 MG tablet Take 2 tablets (80 mg total) by mouth 2 (two) times daily. 360 tablet 3  . glimepiride (AMARYL) 2 MG tablet TAKE 1 TABLET (2 MG TOTAL) BY MOUTH DAILY WITH BREAKFAST. 90 tablet 0  . Lancets (FREESTYLE) lancets Use once daily to check blood sugar.  DX E11.9 100 each 6  . metFORMIN (GLUCOPHAGE) 1000 MG tablet TAKE ONE AND ONE-HALF TABLETS BY MOUTH EVERY MORNING AND 1 TALBET IN THE EVENING. 225 tablet 0  . metoprolol tartrate (LOPRESSOR) 25 MG tablet TAKE ONE-HALF TABLET BY MOUTH TWICE DAILY 30 tablet 3  . montelukast (SINGULAIR) 10 MG tablet Take 1 tablet (10 mg total) by mouth at bedtime. 90 tablet 1  . nitroGLYCERIN (NITROSTAT) 0.4 MG SL tablet Place 1 tablet (0.4 mg total) under the tongue every 5 (five) minutes as needed for chest pain. 25 tablet 3  . potassium chloride SA (K-DUR,KLOR-CON) 20 MEQ tablet TAKE ONE TABLET BY MOUTH TWICE DAILY 60 tablet 11  . pregabalin (LYRICA) 75 MG capsule Take 75 mg by mouth 2 (two) times daily.    . psyllium (HYDROCIL/METAMUCIL) 95 % PACK Take 1 packet by mouth 2 (two) times daily.     No facility-administered medications prior to visit.     Allergies  Allergen Reactions  . Benazepril     angioedema; he is not a candidate for any angiotensin receptor blockers because of this significant allergic reaction. Because of a history of documented adverse serious drug reaction;Medi Alert bracelet  is recommended  . Hctz [Hydrochlorothiazide] Anaphylaxis and Swelling    Tongue and lip swelling   . Lactose Intolerance (Gi)   . Aspirin  Other (See Comments)    Gastritis, cant take 325 Mg aspirin     Review of Systems  Constitutional: Negative for fever and malaise/fatigue.  HENT: Negative for congestion.   Eyes: Negative for blurred vision.  Respiratory: Negative for cough and shortness of breath.   Cardiovascular: Negative for chest pain, palpitations and leg swelling.  Gastrointestinal: Negative for vomiting.  Musculoskeletal: Positive for neck pain. Negative for back pain.  Skin: Negative for rash.  Neurological: Negative for loss of consciousness and headaches.       Objective:    Physical Exam  Constitutional: He is oriented to person, place, and time. Vital signs are normal. He appears well-developed and well-nourished. He is sleeping.  HENT:  Head: Normocephalic and atraumatic.  Mouth/Throat: Oropharynx is clear and moist.  Eyes: EOM are normal. Pupils are equal, round, and reactive to light.  Neck: Normal range of motion. Neck supple. Carotid bruit is present. No thyromegaly present.  Cardiovascular: Normal rate and regular rhythm.   Murmur heard. Pulmonary/Chest: Effort normal and breath sounds normal. No respiratory distress. He has no wheezes. He has no rales. He exhibits no tenderness.  Musculoskeletal: He exhibits no edema or tenderness.       Right shoulder: He exhibits decreased range of motion and spasm.       Cervical back: He exhibits decreased range of motion, pain and spasm.       Back:       Arms: Lymphadenopathy:    He has no cervical adenopathy.  Neurological: He is alert and oriented to person, place, and time.  Skin: Skin is warm and dry.  Psychiatric: He has a normal mood and affect. His behavior is normal. Judgment and thought content normal.  Nursing note and vitals  reviewed.   BP 138/60 (BP Location: Left Arm, Cuff Size: Normal)   Pulse 85   Temp 98.2 F (36.8 C) (Oral)   Resp 16   Ht 6' (1.829 m)   Wt 263 lb 6.4 oz (119.5 kg)   SpO2 93%   BMI 35.72 kg/m  Wt Readings  from Last 3 Encounters:  12/16/16 263 lb 6.4 oz (119.5 kg)  12/02/16 263 lb (119.3 kg)  11/21/16 266 lb 3.2 oz (120.7 kg)   BP Readings from Last 3 Encounters:  12/16/16 138/60  12/04/16 128/67  12/02/16 137/71     Immunization History  Administered Date(s) Administered  . Influenza Split 06/12/2011, 05/31/2013  . Influenza Whole 05/01/2012  . Influenza, High Dose Seasonal PF 02/09/2015  . Influenza-Unspecified 02/11/2014, 02/29/2016  . Pneumococcal Conjugate-13 09/18/2015  . Pneumococcal Polysaccharide-23 06/20/2013  . Td 03/20/2010, 03/16/2013  . Zoster Recombinat (Shingrix) 11/21/2016    Health Maintenance  Topic Date Due  . INFLUENZA VACCINE  01/29/2017  . URINE MICROALBUMIN  03/15/2017  . HEMOGLOBIN A1C  05/24/2017  . OPHTHALMOLOGY EXAM  10/16/2017  . FOOT EXAM  11/21/2017  . COLONOSCOPY  02/19/2018  . TETANUS/TDAP  03/17/2023  . PNA vac Low Risk Adult  Completed    Lab Results  Component Value Date   WBC 7.8 12/02/2016   HGB 13.6 12/02/2016   HCT 41.2 12/02/2016   PLT 220 12/02/2016   GLUCOSE 133 (H) 11/21/2016   CHOL 125 11/21/2016   TRIG 195.0 (H) 11/21/2016   HDL 29.20 (L) 11/21/2016   LDLDIRECT 71.0 09/18/2015   LDLCALC 57 11/21/2016   ALT 18 11/21/2016   AST 21 11/21/2016   NA 138 11/21/2016   K 4.0 11/21/2016   CL 100 11/21/2016   CREATININE 1.11 11/21/2016   BUN 18 11/21/2016   CO2 31 11/21/2016   TSH 2.53 07/28/2015   PSA 0.65 09/27/2013   INR 1.15 03/16/2014   HGBA1C 7.2 (H) 11/21/2016   MICROALBUR 0.4 03/15/2016    Lab Results  Component Value Date   TSH 2.53 07/28/2015   Lab Results  Component Value Date   WBC 7.8 12/02/2016   HGB 13.6 12/02/2016   HCT 41.2 12/02/2016   MCV 86 12/02/2016   PLT 220 12/02/2016   Lab Results  Component Value Date   NA 138 11/21/2016   K 4.0 11/21/2016   CO2 31 11/21/2016   GLUCOSE 133 (H) 11/21/2016   BUN 18 11/21/2016   CREATININE 1.11 11/21/2016   BILITOT 0.7 11/21/2016   ALKPHOS 121  (H) 11/21/2016   AST 21 11/21/2016   ALT 18 11/21/2016   PROT 7.6 11/21/2016   ALBUMIN 4.6 11/21/2016   CALCIUM 9.9 11/21/2016   ANIONGAP 10 10/06/2014   GFR 68.04 11/21/2016   Lab Results  Component Value Date   CHOL 125 11/21/2016   Lab Results  Component Value Date   HDL 29.20 (L) 11/21/2016   Lab Results  Component Value Date   LDLCALC 57 11/21/2016   Lab Results  Component Value Date   TRIG 195.0 (H) 11/21/2016   Lab Results  Component Value Date   CHOLHDL 4 11/21/2016   Lab Results  Component Value Date   HGBA1C 7.2 (H) 11/21/2016         Assessment & Plan:   Problem List Items Addressed This Visit    None    Visit Diagnoses    Cervical strain, acute, initial encounter    -  Primary   Relevant Medications  tiZANidine (ZANAFLEX) 4 MG tablet      Alt ice and heat  muscle relaxer per orders and rto prn   I am having Mr. Telleria start on tiZANidine. I am also having him maintain his allopurinol, esomeprazole, psyllium, aspirin, pregabalin, montelukast, furosemide, amLODipine, potassium chloride SA, atorvastatin, fluticasone, fenofibrate, ezetimibe, nitroGLYCERIN, metoprolol tartrate, freestyle, FREESTYLE LITE, glimepiride, DUREZOL, and metFORMIN.  Meds ordered this encounter  Medications  . tiZANidine (ZANAFLEX) 4 MG tablet    Sig: Take 1 tablet (4 mg total) by mouth every 6 (six) hours as needed for muscle spasms.    Dispense:  30 tablet    Refill:  1    CMA served as scribe during this visit. History, Physical and Plan performed by medical provider. Documentation and orders reviewed and attested to.  [name], DO

## 2016-12-16 NOTE — Patient Instructions (Signed)
Cervical Sprain A cervical sprain is a stretch or tear in one or more of the tough, cord-like tissues that connect bones (ligaments) in the neck. Cervical sprains can range from mild to severe. Severe cervical sprains can cause the spinal bones (vertebrae) in the neck to be unstable. This can lead to spinal cord damage and can result in serious nervous system problems. The amount of time that it takes for a cervical sprain to get better depends on the cause and extent of the injury. Most cervical sprains heal in 4-6 weeks. What are the causes? Cervical sprains may be caused by an injury (trauma), such as from a motor vehicle accident, a fall, or sudden forward and backward whipping movement of the head and neck (whiplash injury). Mild cervical sprains may be caused by wear and tear over time, such as from poor posture, sitting in a chair that does not provide support, or looking up or down for long periods of time. What increases the risk? The following factors may make you more likely to develop this condition:  Participating in activities that have a high risk of trauma to the neck. These include contact sports, auto racing, gymnastics, and diving.  Taking risks when driving or riding in a motor vehicle, such as speeding.  Having osteoarthritis of the spine.  Having poor strength and flexibility of the neck.  A previous neck injury.  Having poor posture.  Spending a lot of time in certain positions that put stress on the neck, such as sitting at a computer for long periods of time. What are the signs or symptoms? Symptoms of this condition include:  Pain, soreness, stiffness, tenderness, swelling, or a burning sensation in the front, back, or sides of the neck.  Sudden tightening of neck muscles that you cannot control (muscle spasms).  Pain in the shoulders or upper back.  Limited ability to move the neck.  Headache.  Dizziness.  Nausea.  Vomiting.  Weakness, numbness, or  tingling in a hand or an arm. Symptoms may develop right away after injury, or they may develop over a few days. In some cases, symptoms may go away with treatment and return (recur) over time. How is this diagnosed? This condition may be diagnosed based on:  Your medical history.  Your symptoms.  Any recent injuries or known neck problems that you have, such as arthritis in the neck.  A physical exam.  Imaging tests, such as:  X-rays.  MRI.  CT scan. How is this treated? This condition is treated by resting and icing the injured area and doing physical therapy exercises. Depending on the severity of your condition, treatment may also include:  Keeping your neck in place (immobilized) for periods of time. This may be done using:  A cervical collar. This supports your chin and the back of your head.  A cervical traction device. This is a sling that holds up your head. This removes weight and pressure from your neck, and it may help to relieve pain.  Medicines that help to relieve pain and inflammation.  Medicines that help to relax your muscles (muscle relaxants).  Surgery. This is rare. Follow these instructions at home: If you have a cervical collar:   Wear it as told by your health care provider. Do not remove the collar unless instructed by your health care provider.  Ask your health care provider before you make any adjustments to your collar.  If you have long hair, keep it outside of the collar.    Ask your health care provider if you can remove the collar for cleaning and bathing. If you are allowed to remove the collar for cleaning or bathing:  Follow instructions from your health care provider about how to remove the collar safely.  Clean the collar by wiping it with mild soap and water and drying it completely.  If your collar has removable pads, remove them every 1-2 days and wash them by hand with soap and water. Let them air-dry completely before you put  them back in the collar.  Check your skin under the collar for irritation or sores. If you see any, tell your health care provider. Managing pain, stiffness, and swelling   If directed, use a cervical traction device as told by your health care provider.  If directed, apply heat to the affected area before you do your physical therapy or as often as told by your health care provider. Use the heat source that your health care provider recommends, such as a moist heat pack or a heating pad.  Place a towel between your skin and the heat source.  Leave the heat on for 20-30 minutes.  Remove the heat if your skin turns bright red. This is especially important if you are unable to feel pain, heat, or cold. You may have a greater risk of getting burned.  If directed, put ice on the affected area:  Put ice in a plastic bag.  Place a towel between your skin and the bag.  Leave the ice on for 20 minutes, 2-3 times a day. Activity   Do not drive while wearing a cervical collar. If you do not have a cervical collar, ask your health care provider if it is safe to drive while your neck heals.  Do not drive or use heavy machinery while taking prescription pain medicine or muscle relaxants, unless your health care provider approves.  Do not lift anything that is heavier than 10 lb (4.5 kg) until your health care provider tells you that it is safe.  Rest as directed by your health care provider. Avoid positions and activities that make your symptoms worse. Ask your health care provider what activities are safe for you.  If physical therapy was prescribed, do exercises as told by your health care provider or physical therapist. General instructions   Take over-the-counter and prescription medicines only as told by your health care provider.  Do not use any products that contain nicotine or tobacco, such as cigarettes and e-cigarettes. These can delay healing. If you need help quitting, ask your  health care provider.  Keep all follow-up visits as told by your health care provider or physical therapist. This is important. How is this prevented? To prevent a cervical sprain from happening again:  Use and maintain good posture. Make any needed adjustments to your workstation to help you use good posture.  Exercise regularly as directed by your health care provider or physical therapist.  Avoid risky activities that may cause a cervical sprain. Contact a health care provider if:  You have symptoms that get worse or do not get better after 2 weeks of treatment.  You have pain that gets worse or does not get better with medicine.  You develop new, unexplained symptoms.  You have sores or irritated skin on your neck from wearing your cervical collar. Get help right away if:  You have severe pain.  You develop numbness, tingling, or weakness in any part of your body.  You cannot move   a part of your body (you have paralysis).  You have neck pain along with:  Severe dizziness.  Headache. Summary  A cervical sprain is a stretch or tear in one or more of the tough, cord-like tissues that connect bones (ligaments) in the neck.  Cervical sprains may be caused by an injury (trauma), such as from a motor vehicle accident, a fall, or sudden forward and backward whipping movement of the head and neck (whiplash injury).  Symptoms may develop right away after injury, or they may develop over a few days.  This condition is treated by resting and icing the injured area and doing physical therapy exercises. This information is not intended to replace advice given to you by your health care provider. Make sure you discuss any questions you have with your health care provider. Document Released: 04/14/2007 Document Revised: 02/14/2016 Document Reviewed: 02/14/2016 [employer] Interactive Patient Education  2017 [employer]  

## 2016-12-17 ENCOUNTER — Other Ambulatory Visit: Payer: Self-pay | Admitting: Cardiology

## 2016-12-17 DIAGNOSIS — M542 Cervicalgia: Secondary | ICD-10-CM | POA: Diagnosis not present

## 2016-12-18 ENCOUNTER — Encounter: Payer: Self-pay | Admitting: Cardiology

## 2016-12-23 DIAGNOSIS — M1712 Unilateral primary osteoarthritis, left knee: Secondary | ICD-10-CM | POA: Diagnosis not present

## 2016-12-25 ENCOUNTER — Other Ambulatory Visit (HOSPITAL_BASED_OUTPATIENT_CLINIC_OR_DEPARTMENT_OTHER): Payer: Medicare Other

## 2016-12-25 ENCOUNTER — Ambulatory Visit (HOSPITAL_BASED_OUTPATIENT_CLINIC_OR_DEPARTMENT_OTHER): Payer: Medicare Other

## 2016-12-25 VITALS — BP 133/47 | HR 56 | Temp 98.5°F | Resp 20

## 2016-12-25 DIAGNOSIS — D500 Iron deficiency anemia secondary to blood loss (chronic): Secondary | ICD-10-CM | POA: Diagnosis not present

## 2016-12-25 DIAGNOSIS — D510 Vitamin B12 deficiency anemia due to intrinsic factor deficiency: Secondary | ICD-10-CM

## 2016-12-25 DIAGNOSIS — D508 Other iron deficiency anemias: Secondary | ICD-10-CM

## 2016-12-25 DIAGNOSIS — D519 Vitamin B12 deficiency anemia, unspecified: Secondary | ICD-10-CM

## 2016-12-25 LAB — CBC WITH DIFFERENTIAL (CANCER CENTER ONLY)
BASO#: 0 10*3/uL (ref 0.0–0.2)
BASO%: 0.3 % (ref 0.0–2.0)
EOS ABS: 0.1 10*3/uL (ref 0.0–0.5)
EOS%: 1.2 % (ref 0.0–7.0)
HCT: 41.7 % (ref 38.7–49.9)
HEMOGLOBIN: 13.7 g/dL (ref 13.0–17.1)
LYMPH#: 1.6 10*3/uL (ref 0.9–3.3)
LYMPH%: 13.9 % — AB (ref 14.0–48.0)
MCH: 28.2 pg (ref 28.0–33.4)
MCHC: 32.9 g/dL (ref 32.0–35.9)
MCV: 86 fL (ref 82–98)
MONO#: 0.7 10*3/uL (ref 0.1–0.9)
MONO%: 5.8 % (ref 0.0–13.0)
NEUT%: 78.8 % (ref 40.0–80.0)
NEUTROS ABS: 8.9 10*3/uL — AB (ref 1.5–6.5)
Platelets: 248 10*3/uL (ref 145–400)
RBC: 4.85 10*6/uL (ref 4.20–5.70)
RDW: 16.4 % — ABNORMAL HIGH (ref 11.1–15.7)
WBC: 11.3 10*3/uL — AB (ref 4.0–10.0)

## 2016-12-25 MED ORDER — CYANOCOBALAMIN 1000 MCG/ML IJ SOLN
1000.0000 ug | Freq: Once | INTRAMUSCULAR | Status: AC
  Administered 2016-12-25: 1000 ug via INTRAMUSCULAR

## 2016-12-25 MED ORDER — CYANOCOBALAMIN 1000 MCG/ML IJ SOLN
INTRAMUSCULAR | Status: AC
  Filled 2016-12-25: qty 1

## 2016-12-25 NOTE — Patient Instructions (Signed)
Cyanocobalamin, Vitamin B12 injection What is this medicine? CYANOCOBALAMIN (sye an oh koe BAL a min) is a man made form of vitamin B12. Vitamin B12 is used in the growth of healthy blood cells, nerve cells, and proteins in the body. It also helps with the metabolism of fats and carbohydrates. This medicine is used to treat people who can not absorb vitamin B12. This medicine may be used for other purposes; ask your health care provider or pharmacist if you have questions. COMMON BRAND NAME(S): B-12 Compliance Kit, B-12 Injection Kit, Cyomin, LA-12, Nutri-Twelve, Physicians EZ Use B-12, Primabalt What should I tell my health care provider before I take this medicine? They need to know if you have any of these conditions: -kidney disease -Leber's disease -megaloblastic anemia -an unusual or allergic reaction to cyanocobalamin, cobalt, other medicines, foods, dyes, or preservatives -pregnant or trying to get pregnant -breast-feeding How should I use this medicine? This medicine is injected into a muscle or deeply under the skin. It is usually given by a health care professional in a clinic or doctor's office. However, your doctor may teach you how to inject yourself. Follow all instructions. Talk to your pediatrician regarding the use of this medicine in children. Special care may be needed. Overdosage: If you think you have taken too much of this medicine contact a poison control center or emergency room at once. NOTE: This medicine is only for you. Do not share this medicine with others. What if I miss a dose? If you are given your dose at a clinic or doctor's office, call to reschedule your appointment. If you give your own injections and you miss a dose, take it as soon as you can. If it is almost time for your next dose, take only that dose. Do not take double or extra doses. What may interact with this medicine? -colchicine -heavy alcohol intake This list may not describe all possible  interactions. Give your health care provider a list of all the medicines, herbs, non-prescription drugs, or dietary supplements you use. Also tell them if you smoke, drink alcohol, or use illegal drugs. Some items may interact with your medicine. What should I watch for while using this medicine? Visit your doctor or health care professional regularly. You may need blood work done while you are taking this medicine. You may need to follow a special diet. Talk to your doctor. Limit your alcohol intake and avoid smoking to get the best benefit. What side effects may I notice from receiving this medicine? Side effects that you should report to your doctor or health care professional as soon as possible: -allergic reactions like skin rash, itching or hives, swelling of the face, lips, or tongue -blue tint to skin -chest tightness, pain -difficulty breathing, wheezing -dizziness -red, swollen painful area on the leg Side effects that usually do not require medical attention (report to your doctor or health care professional if they continue or are bothersome): -diarrhea -headache This list may not describe all possible side effects. Call your doctor for medical advice about side effects. You may report side effects to FDA at 1-800-FDA-1088. Where should I keep my medicine? Keep out of the reach of children. Store at room temperature between 15 and 30 degrees C (59 and 85 degrees F). Protect from light. Throw away any unused medicine after the expiration date. NOTE: This sheet is a summary. It may not cover all possible information. If you have questions about this medicine, talk to your doctor, pharmacist, or   health care provider.  2018 Elsevier/Gold Standard (2007-09-28 22:10:20)  

## 2016-12-26 DIAGNOSIS — H02135 Senile ectropion of left lower eyelid: Secondary | ICD-10-CM | POA: Diagnosis not present

## 2016-12-26 DIAGNOSIS — H04552 Acquired stenosis of left nasolacrimal duct: Secondary | ICD-10-CM | POA: Diagnosis not present

## 2016-12-26 DIAGNOSIS — H04551 Acquired stenosis of right nasolacrimal duct: Secondary | ICD-10-CM | POA: Diagnosis not present

## 2016-12-26 DIAGNOSIS — H02132 Senile ectropion of right lower eyelid: Secondary | ICD-10-CM | POA: Diagnosis not present

## 2016-12-26 LAB — VITAMIN B12: Vitamin B12: 281 pg/mL (ref 232–1245)

## 2016-12-26 LAB — IRON AND TIBC
%SAT: 24 % (ref 20–55)
Iron: 72 ug/dL (ref 42–163)
TIBC: 300 ug/dL (ref 202–409)
UIBC: 228 ug/dL (ref 117–376)

## 2016-12-26 LAB — FERRITIN: FERRITIN: 362 ng/mL — AB (ref 22–316)

## 2016-12-27 ENCOUNTER — Encounter: Payer: Self-pay | Admitting: *Deleted

## 2017-01-02 NOTE — Progress Notes (Signed)
HPI  The patient presents for follow up of coronary artery disease. He has had bypass several years ago. He was in the hospital September 2015 with some dizziness. The patient underwent cardiac catheterization on 03/16/2014 which showed 100% mid LAD chronic occlusion, with mid to distal vessel filled with patent LIMA graft, diffuse 90% stenosis in OM1, complete occlusion in OM 2, patent sequential SVG to OM1/OM 2, and 99% proximal RCA stenosis with 100% mid RCA occlusion, patent LIMA to RCA, patient does have occluded SVG to diagonal branch. With 4 out 5 of previous bypass grafts were still patent medical management was recommended.  He did have bradycardia and was taken off his beta blocker.   He is still taking low dose.    He returns for follow up.  He says that he is doing well. He might need to have right knee replacement but hasn't scheduled this yet. He's doing some golfing but can do too much activity because of the joint. He does notice that he falls asleep very quickly after eating. When he goes to sit down and is resting his heart rate will be in the 30s. He doesn't describe any presyncope or syncope. He does not describe chest pressure, neck or arm discomfort. He has no new shortness of breath, PND or orthopnea. He's had no new weight gain he has some only mild lower extremity swelling.  Of note I do see an EKG from April of this year that demonstrated some episodes of 2-1 heart block with evidence for Mobitz type I.   Allergies  Allergen Reactions  . Benazepril     angioedema; he is not a candidate for any angiotensin receptor blockers because of this significant allergic reaction. Because of a history of documented adverse serious drug reaction;Medi Alert bracelet  is recommended  . Hctz [Hydrochlorothiazide] Anaphylaxis and Swelling    Tongue and lip swelling   . Lactose Intolerance (Gi)   . Aspirin Other (See Comments)    Gastritis, cant take 325 Mg aspirin     Current  Outpatient Prescriptions  Medication Sig Dispense Refill  . allopurinol (ZYLOPRIM) 300 MG tablet Take 450 mg by mouth daily.     [name] amLODipine (NORVASC) 5 MG tablet TAKE 2 TABLETS (10 MG TOTAL) BY MOUTH DAILY. 60 tablet 0  . aspirin 81 MG tablet Take 81 mg by mouth daily.    [name] atorvastatin (LIPITOR) 80 MG tablet TAKE ONE TABLET BY MOUTH AT BEDTIME 90 tablet 3  . esomeprazole (NEXIUM) 40 MG capsule Take 1 capsule (40 mg total) by mouth daily. 30 capsule 5  . ezetimibe (ZETIA) 10 MG tablet TAKE ONE TABLET BY MOUTH DAILY 30 tablet 4  . fenofibrate 160 MG tablet TAKE 1 TABLET BY MOUTH EVERY DAY 30 tablet 5  . fluticasone (FLONASE) 50 MCG/ACT nasal spray INHALE ONE SPRAY INTO EACH NOSTRIL TWICE DAILY. (Patient taking differently: INHALE ONE SPRAY INTO EACH NOSTRIL TWICE DAILY AS NEEDED FOR ALLERGIES) 1 g 6  . FREESTYLE LITE test strip TEST ONE TIME DAILY 100 each 0  . furosemide (LASIX) 40 MG tablet Take 2 tablets (80 mg total) by mouth 2 (two) times daily. 360 tablet 3  . glimepiride (AMARYL) 2 MG tablet TAKE 1 TABLET (2 MG TOTAL) BY MOUTH DAILY WITH BREAKFAST. 90 tablet 0  . KLOR-CON M20 20 MEQ tablet TAKE ONE TABLET BY MOUTH TWICE DAILY 60 tablet 0  . Lancets (FREESTYLE) lancets Use once daily to check blood sugar.  DX E11.9  100 each 6  . metFORMIN (GLUCOPHAGE) 1000 MG tablet TAKE ONE AND ONE-HALF TABLETS BY MOUTH EVERY MORNING AND 1 TALBET IN THE EVENING. 225 tablet 0  . metoprolol tartrate (LOPRESSOR) 25 MG tablet TAKE ONE-HALF TABLET BY MOUTH TWICE DAILY 30 tablet 3  . montelukast (SINGULAIR) 10 MG tablet Take 1 tablet (10 mg total) by mouth at bedtime. 90 tablet 1  . nitroGLYCERIN (NITROSTAT) 0.4 MG SL tablet Place 1 tablet (0.4 mg total) under the tongue every 5 (five) minutes as needed for chest pain. 25 tablet 3  . pregabalin (LYRICA) 75 MG capsule Take 75 mg by mouth 2 (two) times daily.    . psyllium (HYDROCIL/METAMUCIL) 95 % PACK Take 1 packet by mouth 2 (two) times daily.     No current  facility-administered medications for this visit.     Past Medical History:  Diagnosis Date  . Adenomatous colon polyp 05/01/2015   tubular  . Anemia   . Antritis (stomach)    mild  . Arthritis    "knees, hips, shoulders" (03/15/2014)  . CAD (coronary artery disease)   . Chronic lower back pain   . Diverticulosis    mild, left colon  . Duodenitis    chronic  . Esophageal stricture    X 4  . GERD (gastroesophageal reflux disease)   . Gout   . History of gout   . HNP (herniated nucleus pulposus), lumbar   . HTN (hypertension)   . Migraines    "til age 52 yrs"  . Mixed hyperlipidemia   . OSA on CPAP    with 2L O2 at night  . Peptic stricture of esophagus   . Renal insufficiency   . Skin cancer    "right neck cut off; several frozen off my arms" (03/15/2014)  . Sliding hiatal hernia   . Spinal stenosis   . Type II diabetes mellitus ([place])   . Vitamin B12 deficiency   . Wenckebach block    a. brady down to 30s due to frequent block, metoprolol held in response.    Past Surgical History:  Procedure Laterality Date  . APPENDECTOMY  ~ 1952  . BACK SURGERY    . CATARACT EXTRACTION W/PHACO Left 11/06/2016   Procedure: CATARACT EXTRACTION PHACO AND INTRAOCULAR LENS PLACEMENT (IOC);  Surgeon: Estill Cotta, MD;  Location: [facility] ORS;  Service: Ophthalmology;  Laterality: Left;  Lot # [id number] H [place]: 01:09.4 AP%:25.2 CDE: 30.64  . CATARACT EXTRACTION W/PHACO Right 12/04/2016   Procedure: CATARACT EXTRACTION PHACO AND INTRAOCULAR LENS PLACEMENT (IOC);  Surgeon: Estill Cotta, MD;  Location: [facility] OR[facility];  Service: Ophthalmology;  Laterality: Right;  [place] 1:25.9 AP% 24.1 CDE 39.10 Fluid Pack lot # [id number]  . COLONOSCOPY W/ BIOPSIES AND POLYPECTOMY  2013  . CORONARY ANGIOPLASTY  1993  . CORONARY ANGIOPLASTY WITH STENT PLACEMENT  05/1997   "1"  . CORONARY ARTERY BYPASS GRAFT  03/2000   "CABG X5"  . ESOPHAGEAL DILATION  X 3-4   Dr. Lyla Son; "last one was in the 1990's"  .  ESOPHAGOGASTRODUODENOSCOPY     multiple  . FLEXIBLE SIGMOIDOSCOPY     multiple  . KNEE ARTHROSCOPY Left 2011   meniscus repair  . LEFT HEART CATHETERIZATION WITH CORONARY/GRAFT ANGIOGRAM N/A 03/16/2014   Procedure: LEFT HEART CATHETERIZATION WITH Beatrix Fetters;  Surgeon: Burnell Blanks, MD;  Location: [facility] CATH LAB;  Service: Cardiovascular;  Laterality: N/A;  . LUMBAR LAMINECTOMY/DECOMPRESSION MICRODISCECTOMY Right 06/17/2013   Procedure: LUMBAR LAMINECTOMY MICRODISCECTOMY L4-L5 RIGHT EXCISION OF  SYNOVIAL CYST RIGHT   (1 LEVEL) RIGHT PARTIAL FACETECTOMY;  Surgeon: Tobi Bastos, MD;  Location: [facility];  Service: Orthopedics;  Laterality: Right;  . MYELOGRAM  04/06/13   lumbar, Dr Gladstone Lighter  . PANENDOSCOPY    . SHOULDER SURGERY Right 08/2010   screws placed; "tendons tore off"  . SKIN CANCER EXCISION Right    "neck"  . TONSILLECTOMY  ~ 1954  . UPPER GI ENDOSCOPY  2013   Gastritis; Dr Carlean Purl  . VASECTOMY      ROS:  As stated in the HPI and negative for all other systems.  PHYSICAL EXAM BP 138/64   Pulse 80   Ht 6' (1.829 m)   Wt 261 lb (118.4 kg)   BMI 35.40 kg/m   GENERAL:  Well appearing NECK:  No jugular venous distention, waveform within normal limits, carotid upstroke brisk and symmetric, bilateral bruits, no thyromegaly LUNGS:  Clear to auscultation bilaterally BACK:  No CVA tenderness CHEST:  Unremarkable HEART:  PMI not displaced or sustained,S1 and S2 within normal limits, no S3, no S4, no clicks, no rubs,  apical systolic murmur radiating out the aortic outflow tract and no increase with the strain phase of Valsalva, no diastolic murmurs ABD:  Flat, positive bowel sounds normal in frequency in pitch, no bruits, no rebound, no guarding, no midline pulsatile mass, no hepatomegaly, no splenomegaly EXT:  2 plus pulses throughout, trace edema, no cyanosis no clubbing   GENERAL:  Well appearing NECK:  No jugular venous distention, waveform within normal  limits, carotid upstroke brisk and symmetric, bilateral bruits versus transmitted systolic murmur bruits, no thyromegaly LYMPHATICS:  No cervical, inguinal adenopathy LUNGS:  Clear to auscultation bilaterally BACK:  No CVA tenderness CHEST:  Well healed sternotomy scar. HEART:  PMI not displaced or sustained,S1 and S2 within normal limits, no S3, no S4, no clicks, no rubs, apical systolic murmur radiating out the aortic outflow tract and no increase with the strain phase of Valsalva, no diastolic murmurs ABD:  Flat, positive bowel sounds normal in frequency in pitch, no bruits, no rebound, no guarding, no midline pulsatile mass, no hepatomegaly, no splenomegaly, obese EXT:  2 plus pulses throughout, trace bilateral lower extremityedema, no cyanosis no clubbing  EKG:  Sinus rhythm, Mobitz type I heart block, rate 47  poor anterior R wave progression.  There arenormal 2-1 block 10/28/16   Lab Results  Component Value Date   CHOL 125 11/21/2016   TRIG 195.0 (H) 11/21/2016   HDL 29.20 (L) 11/21/2016   LDLCALC 57 11/21/2016   LDLDIRECT 71.0 09/18/2015   Lab Results  Component Value Date   HGBA1C 7.2 (H) 11/21/2016    ASSESSMENT AND PLAN  CAD:  The patient has no new sypmtoms.  He needs continued risk reduction.   MURMUR:    He has tricuspid regurgitation noted on echo. The murmur does not particularly sound like this and it is not dynamic.  It sounds like AS which was not seen on echo years ago.  I will repeat an echo.  CAROTID STENOSIS:   The patient has moderate stenosis in Nov and will have follow up this Nov.    DYSLIPIDEMIA:   LDL is as above.  He will remain on the meds as listed.  DM:  This is being followed by Carollee Herter, Alferd Apa, DO .   The A1c was 7.21.  No change in therapy.  OBESITY:  He has lost weight over the years.  I will encourage more  of this.  BRADYCARDIA:  I will check a Holter.  He will likely need to stop his beta blocker completely.  I don't, at this point,  see an indication for a pacemaker.

## 2017-01-03 ENCOUNTER — Encounter: Payer: Self-pay | Admitting: Cardiology

## 2017-01-03 ENCOUNTER — Ambulatory Visit (INDEPENDENT_AMBULATORY_CARE_PROVIDER_SITE_OTHER): Payer: Medicare Other | Admitting: Cardiology

## 2017-01-03 VITALS — BP 138/64 | HR 80 | Ht 72.0 in | Wt 261.0 lb

## 2017-01-03 DIAGNOSIS — I6529 Occlusion and stenosis of unspecified carotid artery: Secondary | ICD-10-CM

## 2017-01-03 DIAGNOSIS — R001 Bradycardia, unspecified: Secondary | ICD-10-CM

## 2017-01-03 DIAGNOSIS — I2510 Atherosclerotic heart disease of native coronary artery without angina pectoris: Secondary | ICD-10-CM

## 2017-01-03 DIAGNOSIS — R011 Cardiac murmur, unspecified: Secondary | ICD-10-CM

## 2017-01-03 NOTE — Patient Instructions (Signed)
Medication Instructions:  Your physician recommends that you continue on your current medications as directed. Please refer to the Current Medication list given to you today.  Labwork: NONE   Testing/Procedures: Your physician has recommended that you wear a 24 HOUR holter monitor. Holter monitors are medical devices that record the heart's electrical activity. Doctors most often use these monitors to diagnose arrhythmias. Arrhythmias are problems with the speed or rhythm of the heartbeat. The monitor is a small, portable device. You can wear one while you do your normal daily activities. This is usually used to diagnose what is causing palpitations/syncope (passing out).  Your physician has requested that you have an echocardiogram. Echocardiography is a painless test that uses sound waves to create images of your heart. It provides your doctor with information about the size and shape of your heart and how well your heart's chambers and valves are working. This procedure takes approximately one hour. There are no restrictions for this procedure.  PLEASE TRY TO SCHEDULE BOTH TESTS ON THE SAME DAY PER DR [place].  Follow-Up: Your physician wants you to follow-up in: [place]. You will receive a reminder letter in the mail two months in advance. If you don't receive a letter, please call our office to schedule the follow-up appointment.  Any Other Special Instructions Will Be Listed Below (If Applicable).     If you need a refill on your cardiac medications before your next appointment, please call your pharmacy.

## 2017-01-04 ENCOUNTER — Encounter: Payer: Self-pay | Admitting: Cardiology

## 2017-01-04 DIAGNOSIS — I6529 Occlusion and stenosis of unspecified carotid artery: Secondary | ICD-10-CM | POA: Insufficient documentation

## 2017-01-04 DIAGNOSIS — I2510 Atherosclerotic heart disease of native coronary artery without angina pectoris: Secondary | ICD-10-CM | POA: Insufficient documentation

## 2017-01-04 DIAGNOSIS — R001 Bradycardia, unspecified: Secondary | ICD-10-CM | POA: Insufficient documentation

## 2017-01-10 ENCOUNTER — Other Ambulatory Visit: Payer: Self-pay | Admitting: Cardiology

## 2017-01-14 ENCOUNTER — Other Ambulatory Visit: Payer: Self-pay | Admitting: Cardiology

## 2017-01-15 ENCOUNTER — Other Ambulatory Visit: Payer: Self-pay | Admitting: Cardiology

## 2017-01-15 NOTE — Telephone Encounter (Signed)
Rx has been sent to the pharmacy electronically. ° °

## 2017-01-16 ENCOUNTER — Ambulatory Visit (HOSPITAL_COMMUNITY): Payer: Medicare Other | Attending: Cardiology

## 2017-01-16 ENCOUNTER — Other Ambulatory Visit: Payer: Self-pay

## 2017-01-16 ENCOUNTER — Ambulatory Visit (INDEPENDENT_AMBULATORY_CARE_PROVIDER_SITE_OTHER): Payer: Medicare Other

## 2017-01-16 DIAGNOSIS — E119 Type 2 diabetes mellitus without complications: Secondary | ICD-10-CM | POA: Diagnosis not present

## 2017-01-16 DIAGNOSIS — I350 Nonrheumatic aortic (valve) stenosis: Secondary | ICD-10-CM | POA: Diagnosis not present

## 2017-01-16 DIAGNOSIS — I10 Essential (primary) hypertension: Secondary | ICD-10-CM | POA: Insufficient documentation

## 2017-01-16 DIAGNOSIS — I340 Nonrheumatic mitral (valve) insufficiency: Secondary | ICD-10-CM | POA: Diagnosis not present

## 2017-01-16 DIAGNOSIS — I2510 Atherosclerotic heart disease of native coronary artery without angina pectoris: Secondary | ICD-10-CM | POA: Insufficient documentation

## 2017-01-16 DIAGNOSIS — R001 Bradycardia, unspecified: Secondary | ICD-10-CM | POA: Diagnosis not present

## 2017-01-16 DIAGNOSIS — R011 Cardiac murmur, unspecified: Secondary | ICD-10-CM

## 2017-01-22 ENCOUNTER — Ambulatory Visit (HOSPITAL_BASED_OUTPATIENT_CLINIC_OR_DEPARTMENT_OTHER): Payer: Medicare Other

## 2017-01-22 ENCOUNTER — Ambulatory Visit (HOSPITAL_BASED_OUTPATIENT_CLINIC_OR_DEPARTMENT_OTHER): Payer: Medicare Other | Admitting: Hematology & Oncology

## 2017-01-22 ENCOUNTER — Other Ambulatory Visit (HOSPITAL_BASED_OUTPATIENT_CLINIC_OR_DEPARTMENT_OTHER): Payer: Medicare Other

## 2017-01-22 VITALS — BP 135/60 | HR 55 | Temp 98.3°F | Resp 18 | Wt 259.0 lb

## 2017-01-22 DIAGNOSIS — K909 Intestinal malabsorption, unspecified: Secondary | ICD-10-CM

## 2017-01-22 DIAGNOSIS — D510 Vitamin B12 deficiency anemia due to intrinsic factor deficiency: Secondary | ICD-10-CM

## 2017-01-22 DIAGNOSIS — D508 Other iron deficiency anemias: Secondary | ICD-10-CM

## 2017-01-22 DIAGNOSIS — K922 Gastrointestinal hemorrhage, unspecified: Secondary | ICD-10-CM

## 2017-01-22 DIAGNOSIS — D500 Iron deficiency anemia secondary to blood loss (chronic): Secondary | ICD-10-CM

## 2017-01-22 DIAGNOSIS — D511 Vitamin B12 deficiency anemia due to selective vitamin B12 malabsorption with proteinuria: Secondary | ICD-10-CM

## 2017-01-22 DIAGNOSIS — D519 Vitamin B12 deficiency anemia, unspecified: Secondary | ICD-10-CM

## 2017-01-22 LAB — CBC WITH DIFFERENTIAL (CANCER CENTER ONLY)
BASO#: 0 10*3/uL (ref 0.0–0.2)
BASO%: 0.2 % (ref 0.0–2.0)
EOS%: 1.6 % (ref 0.0–7.0)
Eosinophils Absolute: 0.1 10*3/uL (ref 0.0–0.5)
HEMATOCRIT: 41 % (ref 38.7–49.9)
HEMOGLOBIN: 13.8 g/dL (ref 13.0–17.1)
LYMPH#: 1.4 10*3/uL (ref 0.9–3.3)
LYMPH%: 16.4 % (ref 14.0–48.0)
MCH: 28.9 pg (ref 28.0–33.4)
MCHC: 33.7 g/dL (ref 32.0–35.9)
MCV: 86 fL (ref 82–98)
MONO#: 0.7 10*3/uL (ref 0.1–0.9)
MONO%: 7.6 % (ref 0.0–13.0)
NEUT%: 74.2 % (ref 40.0–80.0)
NEUTROS ABS: 6.3 10*3/uL (ref 1.5–6.5)
Platelets: 178 10*3/uL (ref 145–400)
RBC: 4.77 10*6/uL (ref 4.20–5.70)
RDW: 16.3 % — AB (ref 11.1–15.7)
WBC: 8.6 10*3/uL (ref 4.0–10.0)

## 2017-01-22 MED ORDER — CYANOCOBALAMIN 1000 MCG/ML IJ SOLN
1000.0000 ug | Freq: Once | INTRAMUSCULAR | Status: AC
  Administered 2017-01-22: 1000 ug via INTRAMUSCULAR

## 2017-01-22 MED ORDER — CYANOCOBALAMIN 1000 MCG/ML IJ SOLN
INTRAMUSCULAR | Status: AC
  Filled 2017-01-22: qty 1

## 2017-01-22 NOTE — Progress Notes (Signed)
Hematology and Oncology Follow Up Visit  Richard [name] [id number] [birth year] 76 y.o. 01/22/2017   Principle Diagnosis:   Iron deficiency anemia secondary to malabsorption and GI blood loss.  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     Interim History:  Richard [name] is back for follow-up. He is doing okay. He has been playing golf everyday for the past 3 weeks. He is enjoying this. I must say that he is quite tan.  He does have intrinsic factor antibodies. This probably explains his pernicious anemia.  So far, his iron studies have been doing pretty well. Back in June, his ferritin was 362 with iron saturation of 24%.  He apparently had an echocardiogram done recently. This did show progression of aortic stenosis. He does not appear to be all that symptomatic. He sees his cardiologist regarding this.  He's had no bleeding. He's had no change in bowel or bladder habits. He's had no cough or shortness of breath.  Overall, his performance status is ECOG 1.  Medications:  Current Outpatient Prescriptions:  .  allopurinol (ZYLOPRIM) 300 MG tablet, Take 450 mg by mouth daily. , Disp: , Rfl:  .  amLODipine (NORVASC) 5 MG tablet, TAKE 2 TABLETS (10 MG TOTAL) BY MOUTH DAILY., Disp: 60 tablet, Rfl: 11 .  aspirin 81 MG tablet, Take 81 mg by mouth daily., Disp: , Rfl:  .  atorvastatin (LIPITOR) 80 MG tablet, TAKE ONE TABLET BY MOUTH AT BEDTIME, Disp: 90 tablet, Rfl: 3 .  esomeprazole (NEXIUM) 40 MG capsule, Take 1 capsule (40 mg total) by mouth daily., Disp: 30 capsule, Rfl: 5 .  ezetimibe (ZETIA) 10 MG tablet, TAKE ONE TABLET BY MOUTH DAILY, Disp: 90 tablet, Rfl: 3 .  fenofibrate 160 MG tablet, TAKE 1 TABLET BY MOUTH EVERY DAY, Disp: 30 tablet, Rfl: 5 .  fluticasone (FLONASE) 50 MCG/ACT nasal spray, INHALE ONE SPRAY INTO EACH NOSTRIL TWICE  DAILY. (Patient taking differently: INHALE ONE SPRAY INTO EACH NOSTRIL TWICE DAILY AS NEEDED FOR ALLERGIES), Disp: 1 g, Rfl: 6 .  FREESTYLE LITE test strip, TEST ONE TIME DAILY, Disp: 100 each, Rfl: 0 .  furosemide (LASIX) 40 MG tablet, TAKE 2 TABLETS (80 MG TOTAL) BY MOUTH 2 (TWO) TIMES DAILY., Disp: 360 tablet, Rfl: 0 .  glimepiride (AMARYL) 2 MG tablet, TAKE 1 TABLET (2 MG TOTAL) BY MOUTH DAILY WITH BREAKFAST., Disp: 90 tablet, Rfl: 0 .  KLOR-CON M20 20 MEQ tablet, TAKE ONE TABLET BY MOUTH TWICE DAILY, Disp: 60 tablet, Rfl: 11 .  Lancets (FREESTYLE) lancets, Use once daily to check blood sugar.  DX E11.9, Disp: 100 each, Rfl: 6 .  metFORMIN (GLUCOPHAGE) 1000 MG tablet, TAKE ONE AND ONE-HALF TABLETS BY MOUTH EVERY MORNING AND 1 TALBET IN THE EVENING., Disp: 225 tablet, Rfl: 0 .  metoprolol tartrate (LOPRESSOR) 25 MG tablet, TAKE ONE-HALF TABLET BY MOUTH TWICE DAILY, Disp: 90 tablet, Rfl: 3 .  montelukast (SINGULAIR) 10 MG tablet, Take 1 tablet (10 mg total) by mouth at bedtime., Disp: 90 tablet, Rfl: 1 .  nitroGLYCERIN (NITROSTAT) 0.4 MG SL tablet, Place 1 tablet (0.4 mg total) under the tongue every 5 (five) minutes as needed for chest pain., Disp: 25 tablet, Rfl: 3 .  pregabalin (LYRICA) 75 MG capsule, Take 75 mg by mouth 2 (two) times daily., Disp: , Rfl:  .  psyllium (  HYDROCIL/METAMUCIL) 95 % PACK, Take 1 packet by mouth 2 (two) times daily., Disp: , Rfl:   Allergies:  Allergies  Allergen Reactions  . Benazepril     angioedema; he is not a candidate for any angiotensin receptor blockers because of this significant allergic reaction. Because of a history of documented adverse serious drug reaction;Medi Alert bracelet  is recommended  . Hctz [Hydrochlorothiazide] Anaphylaxis and Swelling    Tongue and lip swelling   . Lactose Intolerance (Gi)   . Aspirin Other (See Comments)    Gastritis, cant take 325 Mg aspirin     Past Medical History, Surgical history, Social history, and Family  History were reviewed and updated.  Review of Systems: As above  Physical Exam:  weight is 259 lb (117.5 kg). His oral temperature is 98.3 F (36.8 C). His blood pressure is 135/60 and his pulse is 55 (abnormal). His respiration is 18 and oxygen saturation is 93%.   Wt Readings from Last 3 Encounters:  01/22/17 259 lb (117.5 kg)  01/03/17 261 lb (118.4 kg)  12/16/16 263 lb 6.4 oz (119.5 kg)     Head and neck exam shows no ocular or oral lesions. His conjunctiva are pink now. He has no palpable cervical or supraclavicular lymph nodes. Lungs are clear. Cardiac exam regular rate and rhythm with A 3/6 systolic ejection murmur. Abdomen is soft. He is obese. Has good bowel sounds. There is no fluid wave. There is no palpable liver or spleen tip. Back exam shows a laminectomy scar in the lumbar spine. No tenderness is noted over the spine, ribs or hips. Extremity shows no clubbing, cyanosis or edema. He may have some chronic nonpitting edema of his lower legs. Skin exam shows no rashes, ecchymoses or petechia. Neurological exam shows no focal neurological deficits.   Lab Results  Component Value Date   WBC 8.6 01/22/2017   HGB 13.8 01/22/2017   HCT 41.0 01/22/2017   MCV 86 01/22/2017   PLT 178 01/22/2017     Chemistry      Component Value Date/Time   NA 138 11/21/2016 1040   NA 139 04/17/2016 1455   K 4.0 11/21/2016 1040   K 4.4 04/17/2016 1455   CL 100 11/21/2016 1040   CL 101 04/17/2016 1455   CO2 31 11/21/2016 1040   CO2 30 (H) 04/17/2016 1455   BUN 18 11/21/2016 1040   BUN 22 04/17/2016 1455   CREATININE 1.11 11/21/2016 1040   CREATININE 1.38 (H) 04/17/2016 1455   CREATININE 1.02 05/22/2011 1621      Component Value Date/Time   CALCIUM 9.9 11/21/2016 1040   CALCIUM 10.2 04/17/2016 1455   ALKPHOS 121 (H) 11/21/2016 1040   ALKPHOS 75 04/17/2016 1455   AST 21 11/21/2016 1040   AST 21 04/17/2016 1455   ALT 18 11/21/2016 1040   ALT 19 04/17/2016 1455   BILITOT 0.7  11/21/2016 1040   BILITOT 0.4 04/17/2016 1455         Impression and Plan: Richard [name] is  a 76 year old gentleman. He has iron deficiency anemia. This is from malabsorption. I does don't think he is able to absorb because of his medications. He also has some chronic low-grade GI bleeding. He's been evaluated for this.  He does have anti-intrinsic factor antibodies.  We will give him B-12 today. We will see what his iron levels show. The fact that his MCV is going down, might indicate that his iron is on the lower side.  I'm  not sure if he is going to need any kind of valvular intervention. It looks like on his echocardiogram that he has significant aortic stenosis. He will follow-up with his cardiologist.   We will continue to have him come back monthly for his B-12 injection. I will see him back in 3 months  [name], MD 7/25/20183:39 PM

## 2017-01-22 NOTE — Patient Instructions (Signed)
Cyanocobalamin, Vitamin B12 injection What is this medicine? CYANOCOBALAMIN (sye an oh koe BAL a min) is a man made form of vitamin B12. Vitamin B12 is used in the growth of healthy blood cells, nerve cells, and proteins in the body. It also helps with the metabolism of fats and carbohydrates. This medicine is used to treat people who can not absorb vitamin B12. This medicine may be used for other purposes; ask your health care provider or pharmacist if you have questions. COMMON BRAND NAME(S): B-12 Compliance Kit, B-12 Injection Kit, Cyomin, LA-12, Nutri-Twelve, Physicians EZ Use B-12, Primabalt What should I tell my health care provider before I take this medicine? They need to know if you have any of these conditions: -kidney disease -Leber's disease -megaloblastic anemia -an unusual or allergic reaction to cyanocobalamin, cobalt, other medicines, foods, dyes, or preservatives -pregnant or trying to get pregnant -breast-feeding How should I use this medicine? This medicine is injected into a muscle or deeply under the skin. It is usually given by a health care professional in a clinic or doctor's office. However, your doctor may teach you how to inject yourself. Follow all instructions. Talk to your pediatrician regarding the use of this medicine in children. Special care may be needed. Overdosage: If you think you have taken too much of this medicine contact a poison control center or emergency room at once. NOTE: This medicine is only for you. Do not share this medicine with others. What if I miss a dose? If you are given your dose at a clinic or doctor's office, call to reschedule your appointment. If you give your own injections and you miss a dose, take it as soon as you can. If it is almost time for your next dose, take only that dose. Do not take double or extra doses. What may interact with this medicine? -colchicine -heavy alcohol intake This list may not describe all possible  interactions. Give your health care provider a list of all the medicines, herbs, non-prescription drugs, or dietary supplements you use. Also tell them if you smoke, drink alcohol, or use illegal drugs. Some items may interact with your medicine. What should I watch for while using this medicine? Visit your doctor or health care professional regularly. You may need blood work done while you are taking this medicine. You may need to follow a special diet. Talk to your doctor. Limit your alcohol intake and avoid smoking to get the best benefit. What side effects may I notice from receiving this medicine? Side effects that you should report to your doctor or health care professional as soon as possible: -allergic reactions like skin rash, itching or hives, swelling of the face, lips, or tongue -blue tint to skin -chest tightness, pain -difficulty breathing, wheezing -dizziness -red, swollen painful area on the leg Side effects that usually do not require medical attention (report to your doctor or health care professional if they continue or are bothersome): -diarrhea -headache This list may not describe all possible side effects. Call your doctor for medical advice about side effects. You may report side effects to FDA at 1-800-FDA-1088. Where should I keep my medicine? Keep out of the reach of children. Store at room temperature between 15 and 30 degrees C (59 and 85 degrees F). Protect from light. Throw away any unused medicine after the expiration date. NOTE: This sheet is a summary. It may not cover all possible information. If you have questions about this medicine, talk to your doctor, pharmacist, or   health care provider.  2018 Elsevier/Gold Standard (2007-09-28 22:10:20)

## 2017-01-23 ENCOUNTER — Telehealth: Payer: Self-pay | Admitting: Cardiology

## 2017-01-23 LAB — IRON AND TIBC
%SAT: 23 % (ref 20–55)
Iron: 64 ug/dL (ref 42–163)
TIBC: 280 ug/dL (ref 202–409)
UIBC: 217 ug/dL (ref 117–376)

## 2017-01-23 LAB — FERRITIN: Ferritin: 409 ng/ml — ABNORMAL HIGH (ref 22–316)

## 2017-01-23 LAB — VITAMIN B12: Vitamin B12: 297 pg/mL (ref 232–1245)

## 2017-01-23 NOTE — Telephone Encounter (Signed)
New message    Pt is calling about his monitor results and his echo results. Please call.

## 2017-01-23 NOTE — Telephone Encounter (Signed)
Pt aware of his Echo, appt made for [facility] august 8th @3 :30 pm

## 2017-01-28 DIAGNOSIS — M542 Cervicalgia: Secondary | ICD-10-CM | POA: Diagnosis not present

## 2017-01-29 ENCOUNTER — Other Ambulatory Visit: Payer: Self-pay | Admitting: Cardiology

## 2017-01-29 DIAGNOSIS — M542 Cervicalgia: Secondary | ICD-10-CM | POA: Diagnosis not present

## 2017-01-29 DIAGNOSIS — E538 Deficiency of other specified B group vitamins: Secondary | ICD-10-CM | POA: Diagnosis not present

## 2017-01-30 ENCOUNTER — Other Ambulatory Visit (HOSPITAL_COMMUNITY): Payer: Self-pay | Admitting: Orthopedic Surgery

## 2017-01-30 ENCOUNTER — Ambulatory Visit
Admission: RE | Admit: 2017-01-30 | Discharge: 2017-01-30 | Disposition: A | Discharge status: Home / Self Care | Payer: Self-pay | Source: Ambulatory Visit | Attending: Orthopedic Surgery | Admitting: Orthopedic Surgery

## 2017-01-30 DIAGNOSIS — R52 Pain, unspecified: Secondary | ICD-10-CM

## 2017-01-30 DIAGNOSIS — M542 Cervicalgia: Secondary | ICD-10-CM

## 2017-01-31 ENCOUNTER — Other Ambulatory Visit (HOSPITAL_COMMUNITY): Payer: Self-pay | Admitting: Orthopedic Surgery

## 2017-01-31 ENCOUNTER — Other Ambulatory Visit: Payer: Self-pay | Admitting: Orthopedic Surgery

## 2017-01-31 DIAGNOSIS — R221 Localized swelling, mass and lump, neck: Secondary | ICD-10-CM

## 2017-01-31 DIAGNOSIS — D492 Neoplasm of unspecified behavior of bone, soft tissue, and skin: Secondary | ICD-10-CM

## 2017-02-04 ENCOUNTER — Other Ambulatory Visit (HOSPITAL_COMMUNITY): Payer: Self-pay | Admitting: Orthopedic Surgery

## 2017-02-04 ENCOUNTER — Other Ambulatory Visit: Payer: Self-pay | Admitting: Orthopedic Surgery

## 2017-02-04 ENCOUNTER — Encounter (HOSPITAL_COMMUNITY)
Admission: RE | Admit: 2017-02-04 | Discharge: 2017-02-04 | Disposition: A | Discharge status: Home / Self Care | Payer: Medicare Other | Source: Ambulatory Visit | Attending: Orthopedic Surgery | Admitting: Orthopedic Surgery

## 2017-02-04 ENCOUNTER — Other Ambulatory Visit: Payer: Self-pay | Admitting: Family Medicine

## 2017-02-04 ENCOUNTER — Ambulatory Visit
Admission: RE | Admit: 2017-02-04 | Discharge: 2017-02-04 | Disposition: A | Discharge status: Home / Self Care | Payer: Self-pay | Source: Ambulatory Visit | Attending: Orthopedic Surgery | Admitting: Orthopedic Surgery

## 2017-02-04 DIAGNOSIS — D492 Neoplasm of unspecified behavior of bone, soft tissue, and skin: Secondary | ICD-10-CM | POA: Insufficient documentation

## 2017-02-04 DIAGNOSIS — M542 Cervicalgia: Secondary | ICD-10-CM

## 2017-02-04 DIAGNOSIS — R221 Localized swelling, mass and lump, neck: Secondary | ICD-10-CM | POA: Diagnosis not present

## 2017-02-04 MED ORDER — TECHNETIUM TC 99M MEDRONATE IV KIT
25.0000 | PACK | Freq: Once | INTRAVENOUS | Status: AC | PRN
  Administered 2017-02-04: 25 via INTRAVENOUS

## 2017-02-05 ENCOUNTER — Ambulatory Visit: Payer: Medicare Other | Admitting: Physician Assistant

## 2017-02-05 NOTE — Progress Notes (Deleted)
Cardiology Office Note    Date:  02/05/2017   ID:  Richard [name], DOB 1941-02-22, MRN [id number]  PCP:  [name], DO  Cardiologist:  Dr. Percival Spanish  No chief complaint on file.   History of Present Illness:  Richard [name] is a 76 y.o. male ***   No EKG   Past Medical History:  Diagnosis Date  . Adenomatous colon polyp 05/01/2015   tubular  . Anemia   . Antritis (stomach)    mild  . Arthritis    "knees, hips, shoulders" (03/15/2014)  . CAD (coronary artery disease)   . Chronic lower back pain   . Diverticulosis    mild, left colon  . Duodenitis    chronic  . Esophageal stricture    X 4  . GERD (gastroesophageal reflux disease)   . Gout   . History of gout   . HNP (herniated nucleus pulposus), lumbar   . HTN (hypertension)   . Migraines    "til age 19 yrs"  . Mixed hyperlipidemia   . OSA on CPAP    with 2L O2 at night  . Peptic stricture of esophagus   . Renal insufficiency   . Skin cancer    "right neck cut off; several frozen off my arms" (03/15/2014)  . Sliding hiatal hernia   . Spinal stenosis   . Type II diabetes mellitus ([place])   . Vitamin B12 deficiency   . Wenckebach block    a. brady down to 30s due to frequent block, metoprolol held in response.    Past Surgical History:  Procedure Laterality Date  . APPENDECTOMY  ~ 1952  . BACK SURGERY    . CATARACT EXTRACTION W/PHACO Left 11/06/2016   Procedure: CATARACT EXTRACTION PHACO AND INTRAOCULAR LENS PLACEMENT (IOC);  Surgeon: Estill Cotta, MD;  Location: [facility] ORS;  Service: Ophthalmology;  Laterality: Left;  Lot # [id number] H [place]: 01:09.4 AP%:25.2 CDE: 30.64  . CATARACT EXTRACTION W/PHACO Right 12/04/2016   Procedure: CATARACT EXTRACTION PHACO AND INTRAOCULAR LENS PLACEMENT (IOC);  Surgeon: Estill Cotta, MD;  Location: [facility] ORS;  Service: Ophthalmology;  Laterality: Right;  [place] 1:25.9 AP% 24.1 CDE 39.10 Fluid Pack lot # [id number]  . COLONOSCOPY W/ BIOPSIES AND POLYPECTOMY  2013    . CORONARY ANGIOPLASTY  1993  . CORONARY ANGIOPLASTY WITH STENT PLACEMENT  05/1997   "1"  . CORONARY ARTERY BYPASS GRAFT  03/2000   "CABG X5"  . ESOPHAGEAL DILATION  X 3-4   Dr. Lyla Son; "last one was in the 1990's"  . ESOPHAGOGASTRODUODENOSCOPY     multiple  . FLEXIBLE SIGMOIDOSCOPY     multiple  . KNEE ARTHROSCOPY Left 2011   meniscus repair  . LEFT HEART CATHETERIZATION WITH CORONARY/GRAFT ANGIOGRAM N/A 03/16/2014   Procedure: LEFT HEART CATHETERIZATION WITH Beatrix Fetters;  Surgeon: Burnell Blanks, MD;  Location: [facility] CATH LAB;  Service: Cardiovascular;  Laterality: N/A;  . LUMBAR LAMINECTOMY/DECOMPRESSION MICRODISCECTOMY Right 06/17/2013   Procedure: LUMBAR LAMINECTOMY MICRODISCECTOMY L4-L5 RIGHT EXCISION OF SYNOVIAL CYST RIGHT   (1 LEVEL) RIGHT PARTIAL FACETECTOMY;  Surgeon: Tobi Bastos, MD;  Location: [facility];  Service: Orthopedics;  Laterality: Right;  . MYELOGRAM  04/06/13   lumbar, Dr Gladstone Lighter  . PANENDOSCOPY    . SHOULDER SURGERY Right 08/2010   screws placed; "tendons tore off"  . SKIN CANCER EXCISION Right    "neck"  . TONSILLECTOMY  ~ 1954  . UPPER GI ENDOSCOPY  2013   Gastritis; Dr Carlean Purl  .  VASECTOMY      Current Medications: Outpatient Medications Prior to Visit  Medication Sig Dispense Refill  . allopurinol (ZYLOPRIM) 300 MG tablet Take 450 mg by mouth daily.     [name] amLODipine (NORVASC) 5 MG tablet TAKE 2 TABLETS (10 MG TOTAL) BY MOUTH DAILY. 60 tablet 11  . aspirin 81 MG tablet Take 81 mg by mouth daily.    [name] atorvastatin (LIPITOR) 80 MG tablet TAKE ONE TABLET BY MOUTH AT BEDTIME 90 tablet 3  . esomeprazole (NEXIUM) 40 MG capsule Take 1 capsule (40 mg total) by mouth daily. 30 capsule 5  . ezetimibe (ZETIA) 10 MG tablet TAKE ONE TABLET BY MOUTH DAILY 90 tablet 3  . fenofibrate 160 MG tablet TAKE 1 TABLET BY MOUTH EVERY DAY 30 tablet 5  . fluticasone (FLONASE) 50 MCG/ACT nasal spray INHALE ONE SPRAY INTO EACH NOSTRIL TWICE DAILY. (Patient  taking differently: INHALE ONE SPRAY INTO EACH NOSTRIL TWICE DAILY AS NEEDED FOR ALLERGIES) 1 g 6  . FREESTYLE LITE test strip TEST BLOOD SUGAR ONCE DAILY 100 each 0  . furosemide (LASIX) 40 MG tablet TAKE 2 TABLETS (80 MG TOTAL) BY MOUTH 2 (TWO) TIMES DAILY. 360 tablet 0  . glimepiride (AMARYL) 2 MG tablet TAKE 1 TABLET (2 MG TOTAL) BY MOUTH DAILY WITH BREAKFAST. 90 tablet 0  . KLOR-CON M20 20 MEQ tablet TAKE ONE TABLET BY MOUTH TWICE DAILY 60 tablet 11  . Lancets (FREESTYLE) lancets Use once daily to check blood sugar.  DX E11.9 100 each 6  . metFORMIN (GLUCOPHAGE) 1000 MG tablet TAKE ONE AND ONE-HALF TABLETS BY MOUTH EVERY MORNING AND 1 TALBET IN THE EVENING. 225 tablet 0  . metoprolol tartrate (LOPRESSOR) 25 MG tablet TAKE ONE-HALF TABLET BY MOUTH TWICE DAILY 90 tablet 3  . montelukast (SINGULAIR) 10 MG tablet Take 1 tablet (10 mg total) by mouth at bedtime. 90 tablet 1  . nitroGLYCERIN (NITROSTAT) 0.4 MG SL tablet Place 1 tablet (0.4 mg total) under the tongue every 5 (five) minutes as needed for chest pain. 25 tablet 3  . pregabalin (LYRICA) 75 MG capsule Take 75 mg by mouth 2 (two) times daily.    . psyllium (HYDROCIL/METAMUCIL) 95 % PACK Take 1 packet by mouth 2 (two) times daily.     No facility-administered medications prior to visit.      Allergies:   Benazepril; Hctz [hydrochlorothiazide]; Lactose intolerance (gi); and Aspirin   Social History   Social History  . Marital status: Divorced    Spouse name: N/A  . Number of children: 2  . Years of education: N/A   Occupational History  .  Retired   Social History Main Topics  . Smoking status: Never Smoker  . Smokeless tobacco: Never Used  . Alcohol use No     Comment: "last drink was in 2012"( 03/15/2014)  . Drug use: No  . Sexual activity: Not Currently   Other Topics Concern  . Not on file   Social History Narrative   Divorced, lives with a roommate.   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     Family History:  The patient's ***family history includes Diabetes in his maternal grandmother; Heart attack in his paternal grandmother; Hypertension in his father; Pancreatic cancer in his mother; Stroke in his father and maternal grandmother.  ROS:   Please see the history of present illness.    ROS All other systems reviewed and are negative.   PHYSICAL EXAM:   VS:  There were no vitals taken for this visit.   GEN: Well nourished, well developed, in no acute distress  HEENT: normal  Neck: no JVD, carotid bruits, or masses Cardiac: ***RRR; no murmurs, rubs, or gallops,no edema  Respiratory:  clear to auscultation bilaterally, normal work of breathing GI: soft, nontender, nondistended, + BS MS: no deformity or atrophy  Skin: warm and dry, no rash Neuro:  Alert and Oriented x 3, Strength and sensation are intact Psych: euthymic mood, full affect  Wt Readings from Last 3 Encounters:  01/22/17 259 lb (117.5 kg)  01/03/17 261 lb (118.4 kg)  12/16/16 263 lb 6.4 oz (119.5 kg)      Studies/Labs Reviewed:   EKG:  EKG is*** ordered today.  The ekg ordered today demonstrates ***  Recent Labs: 11/21/2016: ALT 18; BUN 18; Creatinine, Ser 1.11; Potassium 4.0; Sodium 138 01/22/2017: HGB 13.8; Platelets 178   Lipid Panel    Component Value Date/Time   CHOL 125 11/21/2016 1040   TRIG 195.0 (H) 11/21/2016 1040   HDL 29.20 (L) 11/21/2016 1040   CHOLHDL 4 11/21/2016 1040   VLDL 39.0 11/21/2016 1040   LDLCALC 57 11/21/2016 1040   LDLDIRECT 71.0 09/18/2015 0907    Additional studies/ records that were reviewed today include:  ***    ASSESSMENT:    No diagnosis found.   PLAN:  In order of problems listed above:  1. ***    Medication Adjustments/Labs and Tests Ordered: Current medicines are reviewed at length  with the patient today.  Concerns regarding medicines are outlined above.  Medication changes, Labs and Tests ordered today are listed in the Patient Instructions below. There are no Patient Instructions on file for this visit.   Hilbert Corrigan, [place]  02/05/2017 1:50 PM    [facility] [address], [place], [place]  [address] Phone: [phone]; Fax: [phone]

## 2017-02-06 ENCOUNTER — Telehealth: Payer: Self-pay | Admitting: Physician Assistant

## 2017-02-06 ENCOUNTER — Ambulatory Visit
Admission: RE | Admit: 2017-02-06 | Discharge: 2017-02-06 | Disposition: A | Discharge status: Home / Self Care | Payer: Medicare Other | Source: Ambulatory Visit | Attending: Orthopedic Surgery | Admitting: Orthopedic Surgery

## 2017-02-06 DIAGNOSIS — I700 Atherosclerosis of aorta: Secondary | ICD-10-CM | POA: Diagnosis not present

## 2017-02-06 DIAGNOSIS — R221 Localized swelling, mass and lump, neck: Secondary | ICD-10-CM

## 2017-02-06 MED ORDER — IOPAMIDOL (ISOVUE-300) INJECTION 61%
75.0000 mL | Freq: Once | INTRAVENOUS | Status: AC | PRN
  Administered 2017-02-06: 75 mL via INTRAVENOUS

## 2017-02-06 NOTE — Telephone Encounter (Signed)
I have discussed with the recent heart monitor and echocardiogram result with the patient over the phone. Unfortunately had to cancel his office visit yesterday afternoon due to prolonged down time of [employer] across the system. We will try to bring the patient back on 8/20 or 8/21 for another visit with me on a day Dr. Percival Spanish is also here.   Richard Corrigan PA Pager: [phone]

## 2017-02-06 NOTE — Telephone Encounter (Signed)
Acknowledged. msg send to scheduling.

## 2017-02-10 DIAGNOSIS — M542 Cervicalgia: Secondary | ICD-10-CM | POA: Diagnosis not present

## 2017-02-10 DIAGNOSIS — R221 Localized swelling, mass and lump, neck: Secondary | ICD-10-CM | POA: Diagnosis not present

## 2017-02-12 ENCOUNTER — Ambulatory Visit (HOSPITAL_BASED_OUTPATIENT_CLINIC_OR_DEPARTMENT_OTHER): Payer: Medicare Other

## 2017-02-12 ENCOUNTER — Ambulatory Visit (HOSPITAL_BASED_OUTPATIENT_CLINIC_OR_DEPARTMENT_OTHER): Payer: Medicare Other | Admitting: Family

## 2017-02-12 VITALS — BP 154/64 | HR 53 | Temp 97.9°F | Resp 18 | Wt 257.0 lb

## 2017-02-12 DIAGNOSIS — D511 Vitamin B12 deficiency anemia due to selective vitamin B12 malabsorption with proteinuria: Secondary | ICD-10-CM

## 2017-02-12 DIAGNOSIS — R591 Generalized enlarged lymph nodes: Secondary | ICD-10-CM

## 2017-02-12 DIAGNOSIS — K909 Intestinal malabsorption, unspecified: Secondary | ICD-10-CM

## 2017-02-12 DIAGNOSIS — D510 Vitamin B12 deficiency anemia due to intrinsic factor deficiency: Secondary | ICD-10-CM

## 2017-02-12 DIAGNOSIS — D508 Other iron deficiency anemias: Secondary | ICD-10-CM | POA: Diagnosis not present

## 2017-02-12 LAB — CBC WITH DIFFERENTIAL (CANCER CENTER ONLY)
BASO#: 0 10*3/uL (ref 0.0–0.2)
BASO%: 0.4 % (ref 0.0–2.0)
EOS%: 2.4 % (ref 0.0–7.0)
Eosinophils Absolute: 0.3 10*3/uL (ref 0.0–0.5)
HEMATOCRIT: 42.8 % (ref 38.7–49.9)
HEMOGLOBIN: 14.5 g/dL (ref 13.0–17.1)
LYMPH#: 1.6 10*3/uL (ref 0.9–3.3)
LYMPH%: 14.6 % (ref 14.0–48.0)
MCH: 28.7 pg (ref 28.0–33.4)
MCHC: 33.9 g/dL (ref 32.0–35.9)
MCV: 85 fL (ref 82–98)
MONO#: 0.9 10*3/uL (ref 0.1–0.9)
MONO%: 8 % (ref 0.0–13.0)
NEUT%: 74.6 % (ref 40.0–80.0)
NEUTROS ABS: 8 10*3/uL — AB (ref 1.5–6.5)
Platelets: 177 10*3/uL (ref 145–400)
RBC: 5.06 10*6/uL (ref 4.20–5.70)
RDW: 16.5 % — ABNORMAL HIGH (ref 11.1–15.7)
WBC: 10.7 10*3/uL — AB (ref 4.0–10.0)

## 2017-02-12 LAB — CMP (CANCER CENTER ONLY)
ALBUMIN: 4 g/dL (ref 3.3–5.5)
ALK PHOS: 105 U/L — AB (ref 26–84)
ALT: 35 U/L (ref 10–47)
AST: 26 U/L (ref 11–38)
BILIRUBIN TOTAL: 1 mg/dL (ref 0.20–1.60)
BUN, Bld: 22 mg/dL (ref 7–22)
CALCIUM: 9.6 mg/dL (ref 8.0–10.3)
CO2: 28 meq/L (ref 18–33)
CREATININE: 1.2 mg/dL (ref 0.6–1.2)
Chloride: 101 mEq/L (ref 98–108)
GLUCOSE: 238 mg/dL — AB (ref 73–118)
Potassium: 4.2 mEq/L (ref 3.3–4.7)
SODIUM: 140 meq/L (ref 128–145)
Total Protein: 7.4 g/dL (ref 6.4–8.1)

## 2017-02-12 NOTE — Progress Notes (Signed)
Hematology and Oncology Follow Up Visit  Richard [name] [id number] [birth year] 76 y.o. 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 He states that 8 weeks ago he received the shingles shot and then developed neck pain and stiffness. Prednisone was helpful and he completed 2 weeks last week. He is feeling a little better. During his episodes of neck pain he had a series of scans which ultimately revealed adenopathy in the thorac as well as several left supraclavicular and hilar lymph nodes enlarged.  Bone scan showed some uptake at the mid cervical spine and the right lateral malleolus.  He states that he has an appointment with his cardiologist next week to discuss his needed valve replacement.  He took a week off from golfing due to his neck pain and just started back today.  No fever, chills, n/v, cough, rash, dizziness, SOB, chest pain, palpitations, abdominal pain or changes in bowel or bladder habits.   He is wearing his CPAP at night and resting well. He denies fatigue.  He has some mild swelling in both lower extremities that is unchanged. He takes lasix daily. The neuropathy in his legs is unchanged.  He has maintained a good appetite and is staying well hydrated. His weight is stable.   ECOG Performance Status: 0 - Asymptomatic  Medications:  Allergies as of 02/12/2017      Reactions   Benazepril    angioedema; he is not a candidate for any angiotensin receptor blockers because of this significant allergic reaction. Because of a history of  documented adverse serious drug reaction;Medi Alert bracelet  is recommended   Hctz [hydrochlorothiazide] Anaphylaxis, Swelling   Tongue and lip swelling   Lactose Intolerance (gi)    Aspirin Other (See Comments)   Gastritis, cant take 325 Mg aspirin       Medication List       Accurate as of 02/12/17  3:22 PM. Always use your most recent med list.          allopurinol 300 MG tablet Commonly known as:  ZYLOPRIM Take 450 mg by mouth daily.   amLODipine 5 MG tablet Commonly known as:  NORVASC TAKE 2 TABLETS (10 MG TOTAL) BY MOUTH DAILY.   aspirin 81 MG tablet Take 81 mg by mouth daily.   atorvastatin 80 MG tablet Commonly known as:  LIPITOR TAKE ONE TABLET BY MOUTH AT BEDTIME   esomeprazole 40 MG capsule Commonly known as:  NEXIUM Take 1 capsule (40 mg total) by mouth daily.   ezetimibe 10 MG tablet Commonly known as:  ZETIA TAKE ONE TABLET BY MOUTH DAILY   fenofibrate 160 MG tablet TAKE 1 TABLET BY MOUTH EVERY DAY   fluticasone 50 MCG/ACT nasal spray Commonly known as:  FLONASE INHALE ONE SPRAY INTO EACH NOSTRIL TWICE DAILY.   freestyle lancets Use once daily to check blood sugar.  DX E11.9   FREESTYLE LITE test strip Generic drug:  glucose blood TEST BLOOD SUGAR ONCE DAILY   furosemide 40 MG tablet Commonly known as:  LASIX TAKE 2 TABLETS (80 MG TOTAL) BY  MOUTH 2 (TWO) TIMES DAILY.   glimepiride 2 MG tablet Commonly known as:  AMARYL TAKE 1 TABLET (2 MG TOTAL) BY MOUTH DAILY WITH BREAKFAST.   KLOR-CON M20 20 MEQ tablet Generic drug:  potassium chloride SA TAKE ONE TABLET BY MOUTH TWICE DAILY   metFORMIN 1000 MG tablet Commonly known as:  GLUCOPHAGE TAKE ONE AND ONE-HALF TABLETS BY MOUTH EVERY MORNING AND 1 TALBET IN THE EVENING.   metoprolol tartrate 25 MG tablet Commonly known as:  LOPRESSOR TAKE ONE-HALF TABLET BY MOUTH TWICE DAILY   montelukast 10 MG tablet Commonly known as:  SINGULAIR Take 1 tablet (10 mg total) by mouth at bedtime.     nitroGLYCERIN 0.4 MG SL tablet Commonly known as:  NITROSTAT Place 1 tablet (0.4 mg total) under the tongue every 5 (five) minutes as needed for chest pain.   pregabalin 75 MG capsule Commonly known as:  LYRICA Take 75 mg by mouth 2 (two) times daily.   psyllium 95 % Pack Commonly known as:  HYDROCIL/METAMUCIL Take 1 packet by mouth 2 (two) times daily.       Allergies:  Allergies  Allergen Reactions  . Benazepril     angioedema; he is not a candidate for any angiotensin receptor blockers because of this significant allergic reaction. Because of a history of documented adverse serious drug reaction;Medi Alert bracelet  is recommended  . Hctz [Hydrochlorothiazide] Anaphylaxis and Swelling    Tongue and lip swelling   . Lactose Intolerance (Gi)   . Aspirin Other (See Comments)    Gastritis, cant take 325 Mg aspirin     Past Medical History, Surgical history, Social history, and Family History were reviewed and updated.  Review of Systems: All other 10 point review of systems is negative.   Physical Exam:  vitals were not taken for this visit.  Wt Readings from Last 3 Encounters:  01/22/17 259 lb (117.5 kg)  01/03/17 261 lb (118.4 kg)  12/16/16 263 lb 6.4 oz (119.5 kg)    Ocular: Sclerae unicteric, pupils equal, round and reactive to light Ear-nose-throat: Oropharynx clear, dentition fair Lymphatic: Patient has one small palpable left axillary lymph node. No cervical or supraclavicular adenopathy palpated on exam.  Lungs no rales or rhonchi, good excursion bilaterally Heart regular rate and rhythm, no murmur appreciated Abd soft, nontender, positive bowel sounds, no liver or spleen tip palpated on exam, no fluid wave MSK no focal spinal tenderness, no joint edema Neuro: non-focal, well-oriented, appropriate affect Breasts: Deferred   Lab Results  Component Value Date   WBC 10.7 (H) 02/12/2017   HGB 14.5 02/12/2017   HCT 42.8 02/12/2017   MCV 85 02/12/2017    PLT 177 02/12/2017   Lab Results  Component Value Date   FERRITIN 409 (H) 01/22/2017   IRON 64 01/22/2017   TIBC 280 01/22/2017   UIBC 217 01/22/2017   IRONPCTSAT 23 01/22/2017   Lab Results  Component Value Date   RETICCTPCT 1.1 11/22/2011   RBC 5.06 02/12/2017   RETICCTABS 52.1 11/22/2011   No results found for: KPAFRELGTCHN, LAMBDASER, KAPLAMBRATIO Lab Results  Component Value Date   IGA 159 03/09/2012   No results found for: Ronnald Ramp, A1GS, A2GS, Tillman Sers, SPEI   Chemistry      Component Value Date/Time   NA 138 11/21/2016 1040   NA 139 04/17/2016 1455   K 4.0 11/21/2016 1040   K 4.4 04/17/2016 1455   CL 100 11/21/2016 1040   CL 101 04/17/2016  1455   CO2 31 11/21/2016 1040   CO2 30 (H) 04/17/2016 1455   BUN 18 11/21/2016 1040   BUN 22 04/17/2016 1455   CREATININE 1.11 11/21/2016 1040   CREATININE 1.38 (H) 04/17/2016 1455   CREATININE 1.02 05/22/2011 1621      Component Value Date/Time   CALCIUM 9.9 11/21/2016 1040   CALCIUM 10.2 04/17/2016 1455   ALKPHOS 121 (H) 11/21/2016 1040   ALKPHOS 75 04/17/2016 1455   AST 21 11/21/2016 1040   AST 21 04/17/2016 1455   ALT 18 11/21/2016 1040   ALT 19 04/17/2016 1455   BILITOT 0.7 11/21/2016 1040   BILITOT 0.4 04/17/2016 1455      Impression and Plan: Richard [name] is a very pleasant 76 yo caucasian gentleman that we have seen for years for iron deficiency and pernicious. He is here now after recent scants showed adenopathy in the chest and thorax. He has a palpable left axillary lymph node and we have referred him for excision and biopsy with [facility].  He has no complaints at this time. His neck pain and stiffness is improved since taking Prednisone.   We will get him set up for a PET scan as well for further evaluation.  We will plan to see him back once we have his biopsy results to discuss his treatment plan.  Greater than 50% of his 25 minute face to face visit was  spent counseling and coordinating care.  All questions were answered. Both he and his daughter know to contact our office with any questions or concerns. We can certainly see him sooner if need be.   Eliezer Bottom, NP 8/15/20183:22 PM

## 2017-02-13 ENCOUNTER — Other Ambulatory Visit: Payer: Self-pay | Admitting: Family

## 2017-02-13 DIAGNOSIS — R591 Generalized enlarged lymph nodes: Secondary | ICD-10-CM | POA: Diagnosis present

## 2017-02-13 DIAGNOSIS — D510 Vitamin B12 deficiency anemia due to intrinsic factor deficiency: Secondary | ICD-10-CM

## 2017-02-13 DIAGNOSIS — D508 Other iron deficiency anemias: Secondary | ICD-10-CM | POA: Diagnosis not present

## 2017-02-13 DIAGNOSIS — K909 Intestinal malabsorption, unspecified: Secondary | ICD-10-CM | POA: Diagnosis not present

## 2017-02-13 LAB — IRON AND TIBC
%SAT: 18 % — AB (ref 20–55)
Iron: 54 ug/dL (ref 42–163)
TIBC: 306 ug/dL (ref 202–409)
UIBC: 252 ug/dL (ref 117–376)

## 2017-02-13 LAB — LACTATE DEHYDROGENASE: LDH: 257 U/L — AB (ref 125–245)

## 2017-02-13 LAB — FERRITIN: Ferritin: 557 ng/ml — ABNORMAL HIGH (ref 22–316)

## 2017-02-13 LAB — VITAMIN B12: Vitamin B12: 338 pg/mL (ref 232–1245)

## 2017-02-14 ENCOUNTER — Other Ambulatory Visit: Payer: Self-pay | Admitting: Family Medicine

## 2017-02-14 ENCOUNTER — Ambulatory Visit (HOSPITAL_BASED_OUTPATIENT_CLINIC_OR_DEPARTMENT_OTHER): Payer: Medicare Other

## 2017-02-14 ENCOUNTER — Ambulatory Visit (HOSPITAL_COMMUNITY)
Admission: RE | Admit: 2017-02-14 | Discharge: 2017-02-14 | Disposition: A | Discharge status: Home / Self Care | Payer: Medicare Other | Source: Ambulatory Visit | Attending: Family | Admitting: Family

## 2017-02-14 VITALS — BP 132/55 | HR 50 | Temp 99.1°F | Resp 18

## 2017-02-14 DIAGNOSIS — R590 Localized enlarged lymph nodes: Secondary | ICD-10-CM | POA: Insufficient documentation

## 2017-02-14 DIAGNOSIS — R591 Generalized enlarged lymph nodes: Secondary | ICD-10-CM | POA: Insufficient documentation

## 2017-02-14 DIAGNOSIS — M899 Disorder of bone, unspecified: Secondary | ICD-10-CM | POA: Diagnosis not present

## 2017-02-14 DIAGNOSIS — K909 Intestinal malabsorption, unspecified: Secondary | ICD-10-CM | POA: Diagnosis not present

## 2017-02-14 DIAGNOSIS — I700 Atherosclerosis of aorta: Secondary | ICD-10-CM | POA: Diagnosis not present

## 2017-02-14 DIAGNOSIS — K922 Gastrointestinal hemorrhage, unspecified: Secondary | ICD-10-CM

## 2017-02-14 DIAGNOSIS — D519 Vitamin B12 deficiency anemia, unspecified: Secondary | ICD-10-CM

## 2017-02-14 DIAGNOSIS — D508 Other iron deficiency anemias: Secondary | ICD-10-CM

## 2017-02-14 DIAGNOSIS — C8590 Non-Hodgkin lymphoma, unspecified, unspecified site: Secondary | ICD-10-CM | POA: Diagnosis not present

## 2017-02-14 LAB — GLUCOSE, CAPILLARY: Glucose-Capillary: 210 mg/dL — ABNORMAL HIGH (ref 65–99)

## 2017-02-14 MED ORDER — FLUDEOXYGLUCOSE F - 18 (FDG) INJECTION
12.8000 | Freq: Once | INTRAVENOUS | Status: AC | PRN
  Administered 2017-02-14: 12.8 via INTRAVENOUS

## 2017-02-14 MED ORDER — SODIUM CHLORIDE 0.9 % IV SOLN
510.0000 mg | Freq: Once | INTRAVENOUS | Status: AC
  Administered 2017-02-14: 510 mg via INTRAVENOUS
  Filled 2017-02-14: qty 17

## 2017-02-14 NOTE — Patient Instructions (Signed)

## 2017-02-17 ENCOUNTER — Ambulatory Visit (HOSPITAL_COMMUNITY): Payer: Medicare Other

## 2017-02-17 ENCOUNTER — Ambulatory Visit (INDEPENDENT_AMBULATORY_CARE_PROVIDER_SITE_OTHER): Payer: Medicare Other | Admitting: Physician Assistant

## 2017-02-17 ENCOUNTER — Encounter: Payer: Self-pay | Admitting: Physician Assistant

## 2017-02-17 ENCOUNTER — Other Ambulatory Visit: Payer: Self-pay | Admitting: General Surgery

## 2017-02-17 VITALS — BP 140/75 | HR 97 | Ht 72.0 in | Wt 256.4 lb

## 2017-02-17 DIAGNOSIS — Z9989 Dependence on other enabling machines and devices: Secondary | ICD-10-CM

## 2017-02-17 DIAGNOSIS — E119 Type 2 diabetes mellitus without complications: Secondary | ICD-10-CM

## 2017-02-17 DIAGNOSIS — I350 Nonrheumatic aortic (valve) stenosis: Secondary | ICD-10-CM | POA: Diagnosis not present

## 2017-02-17 DIAGNOSIS — I10 Essential (primary) hypertension: Secondary | ICD-10-CM | POA: Diagnosis not present

## 2017-02-17 DIAGNOSIS — I25810 Atherosclerosis of coronary artery bypass graft(s) without angina pectoris: Secondary | ICD-10-CM

## 2017-02-17 DIAGNOSIS — K219 Gastro-esophageal reflux disease without esophagitis: Secondary | ICD-10-CM | POA: Diagnosis not present

## 2017-02-17 DIAGNOSIS — G4730 Sleep apnea, unspecified: Secondary | ICD-10-CM | POA: Diagnosis not present

## 2017-02-17 DIAGNOSIS — G4733 Obstructive sleep apnea (adult) (pediatric): Secondary | ICD-10-CM

## 2017-02-17 DIAGNOSIS — E785 Hyperlipidemia, unspecified: Secondary | ICD-10-CM

## 2017-02-17 DIAGNOSIS — Z951 Presence of aortocoronary bypass graft: Secondary | ICD-10-CM | POA: Diagnosis not present

## 2017-02-17 DIAGNOSIS — R590 Localized enlarged lymph nodes: Secondary | ICD-10-CM | POA: Diagnosis not present

## 2017-02-17 DIAGNOSIS — R001 Bradycardia, unspecified: Secondary | ICD-10-CM | POA: Diagnosis not present

## 2017-02-17 DIAGNOSIS — R591 Generalized enlarged lymph nodes: Secondary | ICD-10-CM

## 2017-02-17 DIAGNOSIS — I6529 Occlusion and stenosis of unspecified carotid artery: Secondary | ICD-10-CM

## 2017-02-17 DIAGNOSIS — Z6834 Body mass index (BMI) 34.0-34.9, adult: Secondary | ICD-10-CM | POA: Diagnosis not present

## 2017-02-17 NOTE — Patient Instructions (Signed)
Medication Instructions:   No changes - continue current medications.  Labwork:   none  Testing/Procedures:  none  Follow-Up:  3 months with Dr. Percival Spanish  If you need a refill on your cardiac medications before your next appointment, please call your pharmacy.

## 2017-02-17 NOTE — Progress Notes (Signed)
Cardiology Office Note    Date:  02/19/2017   ID:  Richard [name], DOB 1941/03/05, MRN [id number]  PCP:  [name], DO  Cardiologist:  Dr. Percival Spanish   Chief Complaint  Patient presents with  . Follow-up    seen for Dr. Percival Spanish.     History of Present Illness:  Richard [name] is a 76 y.o. male with CAD s/p CABG, GERD, HTN, HLD, OSA on CPAP and DMII. His last cardiac catheterization on 03/16/2014 showed 100% mid LAD chronic occlusion, patent LIMA to distal LAD, diffuse 90% stenosis in OM1, occluded OM 2, patent sequential SVG to OM1/OM 2, 99% proximal RCA stenosis with 100% mid RCA occlusion followed by a patent graft to RCA. He does have occluded SVG to diagonal branch. He had a history of bradycardia and was taken off his beta blocker. He was last seen by Dr. Percival Spanish on 01/03/2017, he says occasional a history of heart rate got down to the 30s, but he does not have any presyncope or syncope. He was given a 24-hour Holter monitor and plan for repeat echocardiogram. Holter monitor showed occasional transient 2-1 heart block, he is on low-dose beta blocker, however he is asymptomatic, therefore no medication adjustment was made. As far as echocardiogram, it does show severe aortic stenosis.  He is accompanied by his daughter today to cardiology office visit. He continued to play golf almost on a daily basis. He says he can walk half a mile without getting shortness of breath. He does occasional have dizziness when he gets up too quickly but no dizziness with ambulation. He denies any chest pain. He has trace amount of edema in the lower extremity, however no orthopnea or paroxysmal nocturnal dyspnea. He has been recently diagnosed with enlarged lymph node in the chest cavity on CT of the chest. He underwent PET scan as well. He has been seen by oncology service. Given lack of symptoms with his severe aortic stenosis, I have discussed with Dr. Percival Spanish, we recommended continued observation  at this time and proceed with oncology evaluation first. We will see him back in 3 month for reassessment.    Past Medical History:  Diagnosis Date  . Adenomatous colon polyp 05/01/2015   tubular  . Anemia   . Antritis (stomach)    mild  . Arthritis    "knees, hips, shoulders" (03/15/2014)  . CAD (coronary artery disease)   . Chronic lower back pain   . Diverticulosis    mild, left colon  . Duodenitis    chronic  . Esophageal stricture    X 4  . GERD (gastroesophageal reflux disease)   . Gout   . History of gout   . HNP (herniated nucleus pulposus), lumbar   . HTN (hypertension)   . Migraines    "til age 33 yrs"  . Mixed hyperlipidemia   . OSA on CPAP    with 2L O2 at night  . Peptic stricture of esophagus   . Renal insufficiency   . Skin cancer    "right neck cut off; several frozen off my arms" (03/15/2014)  . Sliding hiatal hernia   . Spinal stenosis   . Type II diabetes mellitus ([place])   . Vitamin B12 deficiency   . Wenckebach block    a. brady down to 30s due to frequent block, metoprolol held in response.    Past Surgical History:  Procedure Laterality Date  . APPENDECTOMY  ~ 1952  . BACK SURGERY    .  CATARACT EXTRACTION W/PHACO Left 11/06/2016   Procedure: CATARACT EXTRACTION PHACO AND INTRAOCULAR LENS PLACEMENT (IOC);  Surgeon: Estill Cotta, MD;  Location: [facility] ORS;  Service: Ophthalmology;  Laterality: Left;  Lot # [id number] H [place]: 01:09.4 AP%:25.2 CDE: 30.64  . CATARACT EXTRACTION W/PHACO Right 12/04/2016   Procedure: CATARACT EXTRACTION PHACO AND INTRAOCULAR LENS PLACEMENT (IOC);  Surgeon: Estill Cotta, MD;  Location: [facility] ORS;  Service: Ophthalmology;  Laterality: Right;  [place] 1:25.9 AP% 24.1 CDE 39.10 Fluid Pack lot # [id number]  . COLONOSCOPY W/ BIOPSIES AND POLYPECTOMY  2013  . CORONARY ANGIOPLASTY  1993  . CORONARY ANGIOPLASTY WITH STENT PLACEMENT  05/1997   "1"  . CORONARY ARTERY BYPASS GRAFT  03/2000   "CABG X5"  . ESOPHAGEAL DILATION  X  3-4   Dr. Lyla Son; "last one was in the 1990's"  . ESOPHAGOGASTRODUODENOSCOPY     multiple  . FLEXIBLE SIGMOIDOSCOPY     multiple  . KNEE ARTHROSCOPY Left 2011   meniscus repair  . LEFT HEART CATHETERIZATION WITH CORONARY/GRAFT ANGIOGRAM N/A 03/16/2014   Procedure: LEFT HEART CATHETERIZATION WITH Beatrix Fetters;  Surgeon: Burnell Blanks, MD;  Location: [facility] CATH LAB;  Service: Cardiovascular;  Laterality: N/A;  . LUMBAR LAMINECTOMY/DECOMPRESSION MICRODISCECTOMY Right 06/17/2013   Procedure: LUMBAR LAMINECTOMY MICRODISCECTOMY L4-L5 RIGHT EXCISION OF SYNOVIAL CYST RIGHT   (1 LEVEL) RIGHT PARTIAL FACETECTOMY;  Surgeon: Tobi Bastos, MD;  Location: [facility] ORS;  Service: Orthopedics;  Laterality: Right;  . MYELOGRAM  04/06/13   lumbar, Dr Gladstone Lighter  . PANENDOSCOPY    . SHOULDER SURGERY Right 08/2010   screws placed; "tendons tore off"  . SKIN CANCER EXCISION Right    "neck"  . TONSILLECTOMY  ~ 1954  . UPPER GI ENDOSCOPY  2013   Gastritis; Dr Carlean Purl  . VASECTOMY      Current Medications: Outpatient Medications Prior to Visit  Medication Sig Dispense Refill  . allopurinol (ZYLOPRIM) 300 MG tablet Take 450 mg by mouth daily.     [name] amLODipine (NORVASC) 5 MG tablet TAKE 2 TABLETS (10 MG TOTAL) BY MOUTH DAILY. (Patient taking differently: TAKE 1 TABLETS BY MOUTH TWICE DAILY.) 60 tablet 11  . aspirin 81 MG tablet Take 81 mg by mouth daily.    [name] atorvastatin (LIPITOR) 80 MG tablet TAKE ONE TABLET BY MOUTH AT BEDTIME 90 tablet 3  . esomeprazole (NEXIUM) 40 MG capsule Take 1 capsule (40 mg total) by mouth daily. 30 capsule 5  . ezetimibe (ZETIA) 10 MG tablet TAKE ONE TABLET BY MOUTH DAILY 90 tablet 3  . fenofibrate 160 MG tablet TAKE 1 TABLET BY MOUTH EVERY DAY 30 tablet 5  . fluticasone (FLONASE) 50 MCG/ACT nasal spray INHALE ONE SPRAY INTO EACH NOSTRIL TWICE DAILY. (Patient taking differently: INHALE ONE SPRAY INTO EACH NOSTRIL TWICE DAILY AS NEEDED FOR ALLERGIES) 1 g 6  .  FREESTYLE LITE test strip TEST BLOOD SUGAR ONCE DAILY 100 each 0  . furosemide (LASIX) 40 MG tablet TAKE 2 TABLETS (80 MG TOTAL) BY MOUTH 2 (TWO) TIMES DAILY. 360 tablet 0  . glimepiride (AMARYL) 2 MG tablet TAKE 1 TABLET (2 MG TOTAL) BY MOUTH DAILY WITH BREAKFAST. 90 tablet 0  . KLOR-CON M20 20 MEQ tablet TAKE ONE TABLET BY MOUTH TWICE DAILY 60 tablet 11  . Lancets (FREESTYLE) lancets Use once daily to check blood sugar.  DX E11.9 100 each 6  . metFORMIN (GLUCOPHAGE) 1000 MG tablet TAKE ONE AND ONE-HALF TABLETS BY MOUTH EVERY MORNING AND 1 TALBET IN THE  EVENING. 225 tablet 0  . metoprolol tartrate (LOPRESSOR) 25 MG tablet TAKE ONE-HALF TABLET BY MOUTH TWICE DAILY (Patient taking differently: TAKE ONE-HALF TABLET BY MOUTH IN THE MORNING AND HALF TABLET IN THE EVENING) 90 tablet 3  . montelukast (SINGULAIR) 10 MG tablet Take 1 tablet (10 mg total) by mouth at bedtime. 90 tablet 1  . nitroGLYCERIN (NITROSTAT) 0.4 MG SL tablet Place 1 tablet (0.4 mg total) under the tongue every 5 (five) minutes as needed for chest pain. (Patient not taking: Reported on 02/18/2017) 25 tablet 3  . pregabalin (LYRICA) 75 MG capsule Take 75 mg by mouth 2 (two) times daily.    . predniSONE (STERAPRED UNI-PAK 21 TAB) 10 MG (21) TBPK tablet     . psyllium (HYDROCIL/METAMUCIL) 95 % PACK Take 1 packet by mouth 2 (two) times daily.     No facility-administered medications prior to visit.      Allergies:   Benazepril; Hctz [hydrochlorothiazide]; Lactose intolerance (gi); and Aspirin   Social History   Social History  . Marital status: Divorced    Spouse name: N/A  . Number of children: 2  . Years of education: N/A   Occupational History  .  Retired   Social History Main Topics  . Smoking status: Never Smoker  . Smokeless tobacco: Never Used  . Alcohol use No     Comment: "last drink was in 2012"( 03/15/2014)  . Drug use: No  . Sexual activity: Not Currently   Other Topics Concern  . None   Social History  Narrative   Divorced, lives with a roommate.   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     Family History:  The patient's family history includes Diabetes in his maternal grandmother; Heart attack in his paternal grandmother; Hypertension in his father; Pancreatic cancer in his mother; Stroke in his father and maternal grandmother.   ROS:   Please see the history of present illness.    ROS All other systems reviewed and are negative.   PHYSICAL EXAM:   VS:  BP 140/75   Pulse 97   Ht 6' (1.829 m)   Wt 256 lb 6.4 oz (116.3 kg)   BMI 34.77 kg/m    GEN: Well nourished, well developed, in no acute distress  HEENT: normal  Neck: no JVD, carotid bruits, or masses Cardiac: RRR; no murmurs, rubs, or gallops,no edema  Respiratory:  clear to auscultation bilaterally, normal work of breathing GI: soft, nontender, nondistended, + BS MS: no deformity or atrophy  Skin: warm and dry, no rash Neuro:  Alert and Oriented x 3, Strength and sensation are intact Psych: euthymic mood, full affect  Wt Readings from Last 3 Encounters:  02/18/17 255 lb 4 oz (115.8 kg)  02/17/17 256 lb 6.4 oz (116.3 kg)  02/12/17 257 lb (116.6 kg)      Studies/Labs Reviewed:   EKG:  EKG is not ordered today.    Recent Labs: 02/12/2017: ALT(SGPT) 35; BUN, Bld 22; Creat 1.2; HGB 14.5; Platelets 177; Potassium 4.2; Sodium 140   Lipid Panel    Component Value Date/Time   CHOL 125 11/21/2016 1040   TRIG 195.0 (H) 11/21/2016 1040   HDL 29.20 (L) 11/21/2016 1040   CHOLHDL 4 11/21/2016 1040   VLDL 39.0 11/21/2016 1040   LDLCALC 57 11/21/2016  1040   LDLDIRECT 71.0 09/18/2015 0907    Additional studies/ records that were reviewed today include:   Echo 01/16/2017 LV EF: 60% -   65%  Study Conclusions  - Left ventricle: The cavity  size was normal. Wall thickness was   increased in a pattern of mild LVH. Systolic function was normal.   The estimated ejection fraction was in the range of 60% to 65%.   Wall motion was normal; there were no regional wall motion   abnormalities. Doppler parameters are consistent with abnormal   left ventricular relaxation (grade 1 diastolic dysfunction). - Aortic valve: Valve mobility was restricted. There was severe   stenosis. Peak velocity (S): 435 cm/s. Mean gradient (S): 40 mm   Hg. Valve area (VTI): 0.78 cm^2. - Mitral valve: Calcified annulus. There was mild regurgitation. - Left atrium: The atrium was mildly dilated. - Right ventricle: The cavity size was moderately dilated. Wall   thickness was normal. - Pulmonic valve: Transvalvular velocity was minimally increased.   There was no evidence for stenosis. Peak gradient (S): 18 mm Hg.  Impressions:  - When compared to prior ECHO, aortic stenosis has advanced.     Holter Monitor 12/2016 Study Highlights   NSR Baseline artifact Rare PVCs Moderate atrial ectopy. Mobitz Type I AV block noted 2:1 AV block noted. No symptoms reported.         PET scan 02/14/2017 IMPRESSION: 1. Multifocal hypermetabolic lymphadenopathy within the neck, chest and upper abdomen as described. Findings are worrisome for lymphoma. Tissue sampling recommended. 2. Hypermetabolic osseous lesions within the C3 vertebral body and left sacrum, also suspicious for lymphomatous involvement. 3. No evidence of solid organ involvement or splenomegaly. 4.  Aortic Atherosclerosis (ICD10-I70.0).    ASSESSMENT:    1. Severe aortic stenosis   2. Coronary artery disease involving coronary bypass graft of native heart without angina pectoris   3. Essential hypertension   4. Hyperlipidemia, unspecified hyperlipidemia type   5. OSA on CPAP   6. Controlled type 2 diabetes mellitus without complication, without long-term current use of insulin (HCC)    7. Lymphadenopathy   8. Bradycardia      PLAN:  In order of problems listed above:  1. Severe aortic stenosis: Currently asymptomatic, given her recently diagnosed hypermetabolic lymph node, we will continue observation for now. We discussed signs and symptom of heart failure during today's visit. He understands he will need to contact us and to follow-up with cardiology service early if he has any worsening dyspnea on exertion, chest pain, dizziness or acute heart failure symptoms.  2. Enlarged lymph node: Seen on recent CT scan, PET scan showed multifocal hypermetabolic metabolic lymphadenopathy. He is being followed by oncology service.  3. Bradycardia: He does have transient 2-1 AV block on recent her monitor. However he is asymptomatic. He is on very low-dose of metoprolol which should help with his aortic stenosis by slowing down the heart rate and improve left ventricular filling. Does not need any pacemaker at this time.  4. Hypertension: Blood pressure well controlled.  5. Hyperlipidemia: Currently on Zetia and Lipitor, last fasting lipid panel obtained in May 2018 showed cholesterol 125, triglyceride 195, HDL 29, LDL 57. Recommend diet and exercise  6. Obstructive sleep apnea on CPAP   Medication Adjustments/Labs and Tests Ordered: Current medicines are reviewed at length with the patient today.  Concerns regarding medicines are outlined above.  Medication changes, Labs and Tests ordered today are listed in the Patient Instructions below. Patient  Instructions  Medication Instructions:   No changes - continue current medications.  Labwork:   none  Testing/Procedures:  none  Follow-Up:  3 months with Dr. Percival Spanish  If you need a refill on your cardiac medications before your next appointment, please call your pharmacy.      Hilbert Corrigan, [place]  02/19/2017 12:29 AM    [address], [place], [place]  [address] Phone: [phone]; Fax: [phone]

## 2017-02-18 ENCOUNTER — Encounter: Payer: Self-pay | Admitting: Internal Medicine

## 2017-02-18 ENCOUNTER — Other Ambulatory Visit: Payer: Self-pay | Admitting: *Deleted

## 2017-02-18 ENCOUNTER — Ambulatory Visit (INDEPENDENT_AMBULATORY_CARE_PROVIDER_SITE_OTHER): Payer: Medicare Other | Admitting: Internal Medicine

## 2017-02-18 ENCOUNTER — Telehealth: Payer: Self-pay

## 2017-02-18 VITALS — BP 132/68 | HR 78 | Temp 98.1°F | Resp 14 | Ht 72.0 in | Wt 255.2 lb

## 2017-02-18 DIAGNOSIS — M542 Cervicalgia: Secondary | ICD-10-CM | POA: Diagnosis not present

## 2017-02-18 DIAGNOSIS — E1169 Type 2 diabetes mellitus with other specified complication: Secondary | ICD-10-CM | POA: Diagnosis not present

## 2017-02-18 DIAGNOSIS — I6529 Occlusion and stenosis of unspecified carotid artery: Secondary | ICD-10-CM

## 2017-02-18 DIAGNOSIS — E669 Obesity, unspecified: Principal | ICD-10-CM

## 2017-02-18 DIAGNOSIS — R591 Generalized enlarged lymph nodes: Secondary | ICD-10-CM

## 2017-02-18 MED ORDER — ONDANSETRON HCL 4 MG/2ML IJ SOLN: 4.0000 mg | Freq: Once | INTRAMUSCULAR | Status: DC | PRN

## 2017-02-18 MED ORDER — FENTANYL CITRATE (PF) 100 MCG/2ML IJ SOLN: 25.0000 ug | INTRAMUSCULAR | Status: DC | PRN

## 2017-02-18 NOTE — Pre-Procedure Instructions (Signed)
Milen Lengacher [facility]  02/18/2017      [facility] [address] [place] [address], [place] [address] Phone: [phone] Fax: [phone]  [facility] [address] [place], [place] [address] [place] [address] Phone: [phone] Fax: [phone]    Your procedure is scheduled on Tuesday, August 28.  Report to [facility] Admitting at 11:30 AM                Your surgery or procedure is scheduled for 1:30 PM   Call this number if you have problems the morning of surgery: [phone]- pre - op desk                 For any other questions, please call [phone], Monday - Friday 8 AM - 4 PM.    Remember:  Do not eat food or drink liquids after midnight Monday, August 27. Except Drink Carton of Breeze  By 9:30 AM the morning of surgery   Take these medicines the morning of surgery with A SIP OF WATER: allopurinol (ZYLOPRIM), amLODipine (NORVASC), esomeprazole (NEXIUM),  metoprolol tartrate (LOPRESSOR), pregabalin (LYRICA).                   Take if needed: Nitroglycerin.                        1 Week prior to surgery STOP taking Aspirin, Aspirin Products (Goody Powder, Excedrin Migraine), Ibuprofen (Advil), Naproxen (Aleve), Vitamins and Herbal Products (ie Fish Oil).   WHAT DO I DO ABOUT MY DIABETES MEDICATION? [name] Do not take oral diabetes medicines (pills) the morning of surgery.  How to Manage Your Diabetes Before and After Surgery  Why is it important to control my blood sugar before and after surgery? . Improving blood sugar levels before and after surgery helps healing and can limit problems. . A way of improving blood sugar control is eating a healthy diet by: o  Eating less sugar and carbohydrates o  Increasing activity/exercise o  Talking with your doctor about reaching your blood sugar goals . High blood sugars (greater than 180 mg/dL) can raise your risk of infections and slow your  recovery, so you will need to focus on controlling your diabetes during the weeks before surgery. . Make sure that the doctor who takes care of your diabetes knows about your planned surgery including the date and location.  How do I manage my blood sugar before surgery? . Check your blood sugar at least 4 times a day, starting 2 days before surgery, to make sure that the level is not too high or low. o Check your blood sugar the morning of your surgery when you wake up and every 2 hours until you get to the Short Stay unit. . If your blood sugar is less than 70 mg/dL, you will need to treat for low blood sugar: o Do not take insulin. o Treat a low blood sugar (less than 70 mg/dL) with  cup of clear juice (cranberry or apple), 4 glucose tablets, OR glucose gel. o Recheck blood sugar in 15 minutes after treatment (to make sure it is greater than 70 mg/dL). If your blood sugar is not greater than 70 mg/dL on recheck, call [phone] for further instructions. . Report your blood sugar to the short stay nurse when you get to Short Stay.  . If you are admitted  to the hospital after surgery: o Your blood sugar will be checked by the staff and you will probably be given insulin after surgery (instead of oral diabetes medicines) to make sure you have good blood sugar levels. o The goal for blood sugar control after surgery is 80-180 mg/dL.      Special instructions:   [place]- Preparing For Surgery  Before surgery, you can play an important role. Because skin is not sterile, your skin needs to be as free of germs as possible. You can reduce the number of germs on your skin by washing with CHG (chlorahexidine gluconate) Soap before surgery.  CHG is an antiseptic cleaner which kills germs and bonds with the skin to continue killing germs even after washing.  Please do not use if you have an allergy to CHG or antibacterial soaps. If your skin becomes reddened/irritated stop using the CHG.  Do not  shave (including legs and underarms) for at least 48 hours prior to first CHG shower. It is OK to shave your face.  Please follow these instructions carefully.   1. Shower the NIGHT BEFORE SURGERY and the MORNING OF SURGERY with CHG.   2. If you chose to wash your hair, wash your hair first as usual with your normal shampoo.  3. After you shampoo, rinse your hair and body thoroughly to remove the shampoo.   Wash your face and private area with the soap you use at home, then rinse.  4. Use CHG as you would any other liquid soap. You can apply CHG directly to the skin and wash gently with a scrungie or a clean washcloth.   5. Apply the CHG Soap to your body ONLY FROM THE NECK DOWN.  Do not use on open wounds or open sores. Avoid contact with your eyes, ears, mouth and genitals (private parts). Wash genitals (private parts) with your normal soap.  6. Wash thoroughly, paying special attention to the area where your surgery will be performed.  7. Thoroughly rinse your body with warm water from the neck down.  8. DO NOT shower/wash with your normal soap after using and rinsing off the CHG Soap.  9. Pat yourself dry with a CLEAN TOWEL.   10. Wear CLEAN PAJAMAS   11. Place CLEAN SHEETS on your bed the night of your first shower and DO NOT SLEEP WITH PETS.  Day of Surgery: Shower as above. Do not apply any deodorants/lotions, powders or cologne. Please wear clean clothes to the hospital/surgery center.    Do not wear jewelry, make-up or nail polish.  Do not shave 48 hours prior to surgery.  Men may shave face and neck.  Do not bring valuables to the hospital.  [facility] is not responsible for any belongings or valuables.  Contacts, dentures or bridgework may not be worn into surgery.  Leave your suitcase in the car.  After surgery it may be brought to your room.  For patients admitted to the hospital, discharge time will be determined by your treatment team.  Patients discharged the  day of surgery will not be allowed to drive home.   Name and phone number of your driver: -  Please read over the following fact sheets that you were given: Pain Booklet,Coughing and Deep Breathing, Surgical Site Infections.    Patient Signature:  Date:   Nurse Signature:  Date:

## 2017-02-18 NOTE — Patient Instructions (Signed)
GO TO THE LAB : Get the blood work    Please see your primary doctor within 4 weeks

## 2017-02-18 NOTE — Telephone Encounter (Signed)
ROI completed and faxed to [facility] [place] (Dr. Gladstone Lighter)- [phone]. ROI sent for scanning. Awaiting records.

## 2017-02-18 NOTE — Progress Notes (Signed)
Pre visit review using our clinic review tool, if applicable. No additional management support is needed unless otherwise documented below in the visit note. 

## 2017-02-18 NOTE — Progress Notes (Signed)
Subjective:    Patient ID: Richard [name], male    DOB: [birth year], 76 y.o.   MRN: [id number]  DOS:  02/18/2017 Type of visit - description : acute Interval history: Several concerns. About 8 weeks ago had his first shingrex (thinks at the L shoulder) . The next day developed a stiff neck and shortly after severe bilateral shoulder and upper back pain. The pain was severe enough that he had to stop playing golf. He went to see his orthopedic doctor w/ above sx, got x-rays, MRIs and finally a CT of the chest that showed multiple lymphadenopathies. Also he had a lymph node at the left axillary area. All that lead to pt going to his hematologist and subsequently a Psychologist, sport and exercise. To have a biopsy of the left axillary area very soon to rule out lymphoma.  About a week ago noted that he is blood sugars are starting to increase from the 120s to the 180s. He has not changed his diet but he definitely is stressed and somewhat anxious.   Review of Systems  Denies fever chills. No headaches, no weight loss.  Denies any unusual aches or pains other than the upper back and shoulders, specifically no pain around the hips. No diffuse myalgias or visual disturbances.  Past Medical History:  Diagnosis Date  . Adenomatous colon polyp 05/01/2015   tubular  . Anemia   . Antritis (stomach)    mild  . Anxiety   . Arthritis    "knees, hips, shoulders" (03/15/2014)  . CAD (coronary artery disease)    bypass 2001  . Chronic lower back pain   . Diverticulosis    mild, left colon  . Duodenitis    chronic  . Esophageal stricture    X 4  . GERD (gastroesophageal reflux disease)   . Gout   . Heart murmur    Echo in july 2018  . History of gout   . HNP (herniated nucleus pulposus), lumbar   . HTN (hypertension)   . Iron deficiency   . Migraines    "til age 23 yrs"  . Mixed hyperlipidemia   . OSA on CPAP    with 2L O2 at night  . Peptic stricture of esophagus   . Skin cancer    "right neck cut  off; several frozen off my arms" (03/15/2014)  . Sliding hiatal hernia   . Spinal stenosis   . Type II diabetes mellitus ([place])   . Vitamin B12 deficiency   . Wenckebach block    a. brady down to 30s due to frequent block, metoprolol held in response.    Past Surgical History:  Procedure Laterality Date  . APPENDECTOMY  ~ 1952  . BACK SURGERY    . CATARACT EXTRACTION W/PHACO Left 11/06/2016   Procedure: CATARACT EXTRACTION PHACO AND INTRAOCULAR LENS PLACEMENT (IOC);  Surgeon: Estill Cotta, MD;  Location: [facility] ORS;  Service: Ophthalmology;  Laterality: Left;  Lot # [id number] H [place]: 01:09.4 AP%:25.2 CDE: 30.64  . CATARACT EXTRACTION W/PHACO Right 12/04/2016   Procedure: CATARACT EXTRACTION PHACO AND INTRAOCULAR LENS PLACEMENT (IOC);  Surgeon: Estill Cotta, MD;  Location: [facility] ORS;  Service: Ophthalmology;  Laterality: Right;  [place] 1:25.9 AP% 24.1 CDE 39.10 Fluid Pack lot # [id number]  . COLONOSCOPY W/ BIOPSIES AND POLYPECTOMY  2013  . CORONARY ANGIOPLASTY  1993  . CORONARY ANGIOPLASTY WITH STENT PLACEMENT  05/1997   "1"  . CORONARY ARTERY BYPASS GRAFT  03/2000   "CABG X5"  . ESOPHAGEAL DILATION  X 3-4   Dr. Lyla Son; "last one was in the 1990's"  . ESOPHAGOGASTRODUODENOSCOPY     multiple  . FLEXIBLE SIGMOIDOSCOPY     multiple  . KNEE ARTHROSCOPY Left 2011   meniscus repair  . LEFT HEART CATHETERIZATION WITH CORONARY/GRAFT ANGIOGRAM N/A 03/16/2014   Procedure: LEFT HEART CATHETERIZATION WITH Beatrix Fetters;  Surgeon: Burnell Blanks, MD;  Location: [facility] CATH LAB;  Service: Cardiovascular;  Laterality: N/A;  . LUMBAR LAMINECTOMY/DECOMPRESSION MICRODISCECTOMY Right 06/17/2013   Procedure: LUMBAR LAMINECTOMY MICRODISCECTOMY L4-L5 RIGHT EXCISION OF SYNOVIAL CYST RIGHT   (1 LEVEL) RIGHT PARTIAL FACETECTOMY;  Surgeon: Tobi Bastos, MD;  Location: [facility] ORS;  Service: Orthopedics;  Laterality: Right;  . MYELOGRAM  04/06/13   lumbar, Dr Gladstone Lighter  . PANENDOSCOPY    .  SHOULDER SURGERY Right 08/2010   screws placed; "tendons tore off"  . SKIN CANCER EXCISION Right    "neck"  . TONSILLECTOMY  ~ 1954  . UPPER GI ENDOSCOPY  2013   Gastritis; Dr Carlean Purl  . VASECTOMY      Social History   Social History  . Marital status: Divorced    Spouse name: N/A  . Number of children: 2  . Years of education: N/A   Occupational History  .  Retired   Social History Main Topics  . Smoking status: Never Smoker  . Smokeless tobacco: Never Used  . Alcohol use No     Comment: "last drink was in 2012"( 03/15/2014)  . Drug use: No  . Sexual activity: Not Currently   Other Topics Concern  . Not on file   Social History Narrative   Divorced, lives with a roommate.   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      Allergies as of 02/18/2017      Reactions   Benazepril    angioedema; he is not a candidate for any angiotensin receptor blockers because of this significant allergic reaction. Because of a history of documented adverse serious drug reaction;Medi Alert bracelet  is recommended   Hctz [hydrochlorothiazide] Anaphylaxis, Swelling   Tongue and lip swelling   Lactose Intolerance (gi) Nausea And Vomiting   Aspirin Other (See Comments)   Gastritis, cant take 325 Mg aspirin       Medication List       Accurate as of 02/18/17 11:59 PM. Always use your most recent med list.          allopurinol 300 MG tablet Commonly known as:  ZYLOPRIM Take 450 mg by mouth daily.   amLODipine 5 MG tablet Commonly known as:  NORVASC TAKE 2 TABLETS (10 MG TOTAL) BY MOUTH DAILY.   aspirin 81 MG tablet Take 81 mg by mouth daily.   atorvastatin 80 MG tablet Commonly known as:  LIPITOR TAKE ONE TABLET BY MOUTH AT BEDTIME   esomeprazole 40 MG capsule Commonly known as:  NEXIUM Take 1  capsule (40 mg total) by mouth daily.   EXCEDRIN MIGRAINE PO Take 1 tablet by mouth at bedtime as needed (pain). depends on pain if takes 1-2 tablets   ezetimibe 10 MG tablet Commonly known as:  ZETIA TAKE ONE TABLET BY MOUTH DAILY   fenofibrate 160 MG tablet TAKE 1 TABLET BY MOUTH EVERY DAY   fluticasone 50 MCG/ACT nasal spray Commonly known as:  FLONASE INHALE ONE SPRAY INTO EACH NOSTRIL TWICE DAILY.   freestyle lancets Use once daily to check blood sugar.  DX E11.9   FREESTYLE LITE test strip Generic drug:  glucose blood TEST BLOOD SUGAR ONCE DAILY   furosemide 40 MG tablet Commonly known as:  LASIX TAKE 2 TABLETS (80 MG TOTAL) BY MOUTH 2 (TWO) TIMES DAILY.   glimepiride 2 MG tablet Commonly known as:  AMARYL TAKE 1 TABLET (2 MG TOTAL) BY MOUTH DAILY WITH BREAKFAST.   ICY HOT EX Apply 1 application topically daily as needed (muscle pain).   KLOR-CON M20 20 MEQ tablet Generic drug:  potassium chloride SA TAKE ONE TABLET BY MOUTH TWICE DAILY   metFORMIN 1000 MG tablet Commonly known as:  GLUCOPHAGE TAKE ONE AND ONE-HALF TABLETS BY MOUTH EVERY MORNING AND 1 TALBET IN THE EVENING.   metoprolol tartrate 25 MG tablet Commonly known as:  LOPRESSOR TAKE ONE-HALF TABLET BY MOUTH TWICE DAILY   montelukast 10 MG tablet Commonly known as:  SINGULAIR Take 1 tablet (10 mg total) by mouth at bedtime.   nitroGLYCERIN 0.4 MG SL tablet Commonly known as:  NITROSTAT Place 1 tablet (0.4 mg total) under the tongue every 5 (five) minutes as needed for chest pain.   polycarbophil 625 MG tablet Commonly known as:  FIBERCON Take 625 mg by mouth daily.   pregabalin 75 MG capsule Commonly known as:  LYRICA Take 75 mg by mouth 2 (two) times daily.            Discharge Care Instructions        Start     Ordered   02/18/17 0000  Comp Met (CMET)     02/18/17 1432   02/18/17 0000  Hemoglobin A1c     02/18/17 1432         Objective:   Physical Exam BP 132/68 (BP  Location: Right Arm, Patient Position: Sitting, Cuff Size: Normal)   Pulse 78   Temp 98.1 F (36.7 C) (Oral)   Resp 14   Ht 6' (1.829 m)   Wt 255 lb 4 oz (115.8 kg)   SpO2 97%   BMI 34.62 kg/m  General:   Well developed, well nourished . NAD.  HEENT:  Normocephalic . Face symmetric, atraumatic. Neck: Slightly TTP at the right lateral side. No mass. Supraclavicular areas are puffy, but there is no mass. This is a chronic finding according to the patient. Left axillary area: Has a 2 cm soft, mass, not tender, does not seem to be attached to deeper structures.  Lungs:  CTA B Normal respiratory effort, no intercostal retractions, no accessory muscle use. Heart: RRR,  significant systolic murmur.  No pretibial edema bilaterally  Not tender at the temples with good T.A. pulses MSK: No TTP at the upper or lower extremities Skin: Not pale. Not jaundice Neurologic:  alert & oriented X3.  Speech normal, gait appropriate for age and unassisted Psych--  Cognition and judgment appear intact.  Cooperative with normal attention span and concentration.  Behavior appropriate. No anxious or depressed appearing.      Assessment & Plan:    76 year old gentleman with history of DM, B12 deficiency, pernicious anemia, iron deficiency, CAD, AoS, GERD, gout, HTN, hyperlipidemia, sleep apnea, presents with the following:  DM: CBGs increased from the 120s to the 200s in the last week without changes in his medication or diet. He is under a lot of stress. Will get a A1c. Further advise with results. Neck, shoulder  and upper back pain: Follow-up by ortho, reportedly had labs,  MRIs and x-rays at his orthopedic.  Because the pain is at the shoulder girdle PMR comes to mind however he has no pain at the hip area, no fever, chills or weight loss and he feels otherwise well. Will request labs from his orthopedic surgeon, if a sed rate was not done,  will consider that and CRP. Lymphadenopathies: Since we  are checking labs today, will check a CMP previously order for  pre-op purposes. Recent SHINGREX injection: It proceeded his MSK sx,unclear if there is a relationship but recommend to notify of this to his other doctors. Follow-up with PCP in 4 weeks

## 2017-02-19 ENCOUNTER — Encounter (HOSPITAL_COMMUNITY): Payer: Self-pay

## 2017-02-19 ENCOUNTER — Other Ambulatory Visit (HOSPITAL_BASED_OUTPATIENT_CLINIC_OR_DEPARTMENT_OTHER): Payer: Medicare Other

## 2017-02-19 ENCOUNTER — Other Ambulatory Visit: Payer: Self-pay | Admitting: Family

## 2017-02-19 ENCOUNTER — Encounter (HOSPITAL_COMMUNITY)
Admission: RE | Admit: 2017-02-19 | Discharge: 2017-02-19 | Disposition: A | Discharge status: Home / Self Care | Payer: Medicare Other | Source: Ambulatory Visit | Attending: General Surgery | Admitting: General Surgery

## 2017-02-19 ENCOUNTER — Ambulatory Visit (HOSPITAL_BASED_OUTPATIENT_CLINIC_OR_DEPARTMENT_OTHER): Payer: Medicare Other

## 2017-02-19 ENCOUNTER — Encounter: Payer: Self-pay | Admitting: Physician Assistant

## 2017-02-19 VITALS — BP 151/74 | HR 98 | Temp 98.0°F | Resp 18

## 2017-02-19 DIAGNOSIS — I441 Atrioventricular block, second degree: Secondary | ICD-10-CM | POA: Diagnosis not present

## 2017-02-19 DIAGNOSIS — Z7984 Long term (current) use of oral hypoglycemic drugs: Secondary | ICD-10-CM | POA: Insufficient documentation

## 2017-02-19 DIAGNOSIS — E785 Hyperlipidemia, unspecified: Secondary | ICD-10-CM | POA: Insufficient documentation

## 2017-02-19 DIAGNOSIS — M542 Cervicalgia: Secondary | ICD-10-CM

## 2017-02-19 DIAGNOSIS — E119 Type 2 diabetes mellitus without complications: Secondary | ICD-10-CM | POA: Insufficient documentation

## 2017-02-19 DIAGNOSIS — I350 Nonrheumatic aortic (valve) stenosis: Secondary | ICD-10-CM | POA: Diagnosis not present

## 2017-02-19 DIAGNOSIS — D510 Vitamin B12 deficiency anemia due to intrinsic factor deficiency: Secondary | ICD-10-CM | POA: Diagnosis present

## 2017-02-19 DIAGNOSIS — Z01818 Encounter for other preprocedural examination: Secondary | ICD-10-CM | POA: Diagnosis not present

## 2017-02-19 DIAGNOSIS — Z7982 Long term (current) use of aspirin: Secondary | ICD-10-CM | POA: Insufficient documentation

## 2017-02-19 DIAGNOSIS — Z951 Presence of aortocoronary bypass graft: Secondary | ICD-10-CM | POA: Diagnosis not present

## 2017-02-19 DIAGNOSIS — R590 Localized enlarged lymph nodes: Secondary | ICD-10-CM | POA: Insufficient documentation

## 2017-02-19 DIAGNOSIS — D508 Other iron deficiency anemias: Secondary | ICD-10-CM

## 2017-02-19 DIAGNOSIS — Z9849 Cataract extraction status, unspecified eye: Secondary | ICD-10-CM | POA: Insufficient documentation

## 2017-02-19 DIAGNOSIS — G4733 Obstructive sleep apnea (adult) (pediatric): Secondary | ICD-10-CM | POA: Diagnosis not present

## 2017-02-19 DIAGNOSIS — I10 Essential (primary) hypertension: Secondary | ICD-10-CM | POA: Diagnosis not present

## 2017-02-19 DIAGNOSIS — E1169 Type 2 diabetes mellitus with other specified complication: Secondary | ICD-10-CM

## 2017-02-19 DIAGNOSIS — E538 Deficiency of other specified B group vitamins: Secondary | ICD-10-CM | POA: Diagnosis not present

## 2017-02-19 DIAGNOSIS — I2510 Atherosclerotic heart disease of native coronary artery without angina pectoris: Secondary | ICD-10-CM | POA: Insufficient documentation

## 2017-02-19 DIAGNOSIS — Z01812 Encounter for preprocedural laboratory examination: Secondary | ICD-10-CM | POA: Diagnosis not present

## 2017-02-19 DIAGNOSIS — E669 Obesity, unspecified: Principal | ICD-10-CM

## 2017-02-19 DIAGNOSIS — G8929 Other chronic pain: Secondary | ICD-10-CM

## 2017-02-19 DIAGNOSIS — Z79899 Other long term (current) drug therapy: Secondary | ICD-10-CM | POA: Insufficient documentation

## 2017-02-19 DIAGNOSIS — D519 Vitamin B12 deficiency anemia, unspecified: Secondary | ICD-10-CM

## 2017-02-19 DIAGNOSIS — D649 Anemia, unspecified: Secondary | ICD-10-CM | POA: Diagnosis not present

## 2017-02-19 DIAGNOSIS — M546 Pain in thoracic spine: Principal | ICD-10-CM

## 2017-02-19 LAB — COMPREHENSIVE METABOLIC PANEL
ALBUMIN: 4.1 g/dL (ref 3.5–5.2)
ALK PHOS: 106 U/L (ref 39–117)
ALT: 25 U/L (ref 0–53)
AST: 19 U/L (ref 0–37)
BUN: 18 mg/dL (ref 6–23)
CHLORIDE: 99 meq/L (ref 96–112)
CO2: 30 mEq/L (ref 19–32)
CREATININE: 0.95 mg/dL (ref 0.40–1.50)
Calcium: 10.1 mg/dL (ref 8.4–10.5)
GFR: 81.37 mL/min (ref >60.00)
GLUCOSE: 136 mg/dL — AB (ref 70–99)
POTASSIUM: 4.2 meq/L (ref 3.5–5.1)
SODIUM: 139 meq/L (ref 135–145)
TOTAL PROTEIN: 7.6 g/dL (ref 6.0–8.3)
Total Bilirubin: 0.7 mg/dL (ref 0.2–1.2)

## 2017-02-19 LAB — CBC WITH DIFFERENTIAL (CANCER CENTER ONLY)
BASO#: 0 10*3/uL (ref 0.0–0.2)
BASO%: 0.5 % (ref 0.0–2.0)
EOS ABS: 0.3 10*3/uL (ref 0.0–0.5)
EOS%: 4.1 % (ref 0.0–7.0)
HEMATOCRIT: 40.4 % (ref 38.7–49.9)
HEMOGLOBIN: 13.5 g/dL (ref 13.0–17.1)
LYMPH#: 1.2 10*3/uL (ref 0.9–3.3)
LYMPH%: 15.4 % (ref 14.0–48.0)
MCH: 28.6 pg (ref 28.0–33.4)
MCHC: 33.4 g/dL (ref 32.0–35.9)
MCV: 86 fL (ref 82–98)
MONO#: 0.8 10*3/uL (ref 0.1–0.9)
MONO%: 10 % (ref 0.0–13.0)
NEUT%: 70 % (ref 40.0–80.0)
NEUTROS ABS: 5.5 10*3/uL (ref 1.5–6.5)
Platelets: 213 10*3/uL (ref 145–400)
RBC: 4.72 10*6/uL (ref 4.20–5.70)
RDW: 16.8 % — AB (ref 11.1–15.7)
WBC: 7.8 10*3/uL (ref 4.0–10.0)

## 2017-02-19 LAB — COMPREHENSIVE METABOLIC PANEL (CC13)
A/G RATIO: 1.3 (ref 1.2–2.2)
ALBUMIN: 4 g/dL (ref 3.5–4.8)
ALK PHOS: 116 IU/L (ref 39–117)
ALT: 32 IU/L (ref 0–44)
AST: 21 IU/L (ref 0–40)
BILIRUBIN TOTAL: 0.5 mg/dL (ref 0.0–1.2)
BUN / CREAT RATIO: 22 (ref 10–24)
BUN: 20 mg/dL (ref 8–27)
CHLORIDE: 101 mmol/L (ref 96–106)
CREATININE: 0.9 mg/dL (ref 0.76–1.27)
Calcium, Ser: 9.8 mg/dL (ref 8.6–10.2)
Carbon Dioxide, Total: 26 mmol/L (ref 20–29)
GFR calc Af Amer: 94 mL/min/{1.73_m2} (ref >59)
GFR calc non Af Amer: 82 mL/min/{1.73_m2} (ref >59)
GLOBULIN, TOTAL: 3.1 g/dL (ref 1.5–4.5)
Glucose: 155 mg/dL — ABNORMAL HIGH (ref 65–99)
POTASSIUM: 4.1 mmol/L (ref 3.5–5.2)
SODIUM: 139 mmol/L (ref 134–144)
Total Protein: 7.1 g/dL (ref 6.0–8.5)

## 2017-02-19 LAB — HEMOGLOBIN A1C: Hgb A1c MFr Bld: 8.7 % — ABNORMAL HIGH (ref 4.6–6.5)

## 2017-02-19 LAB — GLUCOSE, CAPILLARY: Glucose-Capillary: 184 mg/dL — ABNORMAL HIGH (ref 65–99)

## 2017-02-19 MED ORDER — CYANOCOBALAMIN 1000 MCG/ML IJ SOLN
1000.0000 ug | Freq: Once | INTRAMUSCULAR | Status: AC
  Administered 2017-02-19: 1000 ug via INTRAMUSCULAR

## 2017-02-19 MED ORDER — TRAMADOL HCL 50 MG PO TABS: 50.0000 mg | ORAL_TABLET | Freq: Four times a day (QID) | ORAL | 1 refills | Status: DC | PRN

## 2017-02-19 MED ORDER — SITAGLIPTIN PHOSPHATE 100 MG PO TABS: 100.0000 mg | ORAL_TABLET | Freq: Every day | ORAL | 2 refills | Status: DC

## 2017-02-19 MED ORDER — CYANOCOBALAMIN 1000 MCG/ML IJ SOLN
INTRAMUSCULAR | Status: AC
  Filled 2017-02-19: qty 1

## 2017-02-19 NOTE — Patient Instructions (Signed)
Cyanocobalamin, Vitamin B12 injection What is this medicine? CYANOCOBALAMIN (sye an oh koe BAL a min) is a man made form of vitamin B12. Vitamin B12 is used in the growth of healthy blood cells, nerve cells, and proteins in the body. It also helps with the metabolism of fats and carbohydrates. This medicine is used to treat people who can not absorb vitamin B12. This medicine may be used for other purposes; ask your health care provider or pharmacist if you have questions. COMMON BRAND NAME(S): B-12 Compliance Kit, B-12 Injection Kit, Cyomin, LA-12, Nutri-Twelve, Physicians EZ Use B-12, Primabalt What should I tell my health care provider before I take this medicine? They need to know if you have any of these conditions: -kidney disease -Leber's disease -megaloblastic anemia -an unusual or allergic reaction to cyanocobalamin, cobalt, other medicines, foods, dyes, or preservatives -pregnant or trying to get pregnant -breast-feeding How should I use this medicine? This medicine is injected into a muscle or deeply under the skin. It is usually given by a health care professional in a clinic or doctor's office. However, your doctor may teach you how to inject yourself. Follow all instructions. Talk to your pediatrician regarding the use of this medicine in children. Special care may be needed. Overdosage: If you think you have taken too much of this medicine contact a poison control center or emergency room at once. NOTE: This medicine is only for you. Do not share this medicine with others. What if I miss a dose? If you are given your dose at a clinic or doctor's office, call to reschedule your appointment. If you give your own injections and you miss a dose, take it as soon as you can. If it is almost time for your next dose, take only that dose. Do not take double or extra doses. What may interact with this medicine? -colchicine -heavy alcohol intake This list may not describe all possible  interactions. Give your health care provider a list of all the medicines, herbs, non-prescription drugs, or dietary supplements you use. Also tell them if you smoke, drink alcohol, or use illegal drugs. Some items may interact with your medicine. What should I watch for while using this medicine? Visit your doctor or health care professional regularly. You may need blood work done while you are taking this medicine. You may need to follow a special diet. Talk to your doctor. Limit your alcohol intake and avoid smoking to get the best benefit. What side effects may I notice from receiving this medicine? Side effects that you should report to your doctor or health care professional as soon as possible: -allergic reactions like skin rash, itching or hives, swelling of the face, lips, or tongue -blue tint to skin -chest tightness, pain -difficulty breathing, wheezing -dizziness -red, swollen painful area on the leg Side effects that usually do not require medical attention (report to your doctor or health care professional if they continue or are bothersome): -diarrhea -headache This list may not describe all possible side effects. Call your doctor for medical advice about side effects. You may report side effects to FDA at 1-800-FDA-1088. Where should I keep my medicine? Keep out of the reach of children. Store at room temperature between 15 and 30 degrees C (59 and 85 degrees F). Protect from light. Throw away any unused medicine after the expiration date. NOTE: This sheet is a summary. It may not cover all possible information. If you have questions about this medicine, talk to your doctor, pharmacist, or   health care provider.  2018 Elsevier/Gold Standard (2007-09-28 22:10:20)  

## 2017-02-19 NOTE — Addendum Note (Signed)
Addended byDamita Dunnings D on: 02/19/2017 04:55 PM   Modules accepted: Orders

## 2017-02-19 NOTE — Progress Notes (Signed)
Cardiologist  Dr. Jenkins Rouge  Last seen on 02-18-2015

## 2017-02-20 ENCOUNTER — Encounter (HOSPITAL_COMMUNITY): Payer: Self-pay

## 2017-02-20 NOTE — Progress Notes (Signed)
Anesthesia Chart Review:  Pt is a 77 year old male scheduled for deep excision L axillary lymph node on 02/25/2017 with Fanny Skates, MD  - PCP is Roma Schanz, DO - Cardiologist is Minus Breeding, MD. Last office visit 02/17/17 with Almyra Deforest, PA.   PMH includes:  CAD (s/p CABG 2001), severe aortic stenosis, HTN, mobitz type 1 AV block (pacemaker not needed at this time), hyperlipidemia, DM, OSA, anemia, B12 deficiency, S/p cataract extraction 12/04/16 and 11/06/16. S/p lumbar laminectomy 06/17/13.   - By cardiology notes, pt's severe aortic stenosis is asymptomatic.  Continued observation planned while pt undergoes work up by oncology for possible lymphoma.   Medications include: Amlodipine, ASA 81 mg, Lipitor, Nexium, Zetia, fenofibrate, Lasix, glimepiride, potassium, metformin, metoprolol, sitagliptin  Preoperative labs reviewed.  HbA1c 8.7, glucose 155  CT chest 02/06/17:  1. Multiple sites of adenopathy as well as multiple subcentimeter lymph nodes throughout the thoracic region. Neoplastic etiology must be of concern with particular concern for lymphoma. 2.  Lung edema or consolidation.  No pleural effusion. 3. Foci of atherosclerotic calcification in the aorta and great vessels. Native coronary artery calcification noted. Patient is status post coronary artery bypass grafting. 4.There is arthropathy in both sternoclavicular joints with apparent synovial hypertrophy bilaterally in these areas.  EKG 10/28/16: Marked sinus bradycardia (47 bpm) with marked sinus arrhythmia with 1st degree AV block.   Holter monitor 01/16/17:  - NSR - Baseline artifact - Rare PVCs - Moderate atrial ectopy. - Mobitz Type I AV block noted - 2:1 AV block noted. - No symptoms reported.    Echo 01/16/17:  - Left ventricle: The cavity size was normal. Wall thickness was increased in a pattern of mild LVH. Systolic function was normal. The estimated ejection fraction was in the range of 60% to 65%. Wall  motion was normal; there were no regional wall motion abnormalities. Doppler parameters are consistent with abnormal left ventricular relaxation (grade 1 diastolic dysfunction). - Aortic valve: Valve mobility was restricted. There was severe stenosis. Peak velocity (S): 435 cm/s. Mean gradient (S): 40 mm Hg. Valve area (VTI): 0.78 cm^2. - Mitral valve: Calcified annulus. There was mild regurgitation. - Left atrium: The atrium was mildly dilated. - Right ventricle: The cavity size was moderately dilated. Wall thickness was normal. - Pulmonic valve: Transvalvular velocity was minimally increased. There was no evidence for stenosis. Peak gradient (S): 18 mm Hg. - Impressions: When compared to prior ECHO, aortic stenosis has advanced.  Carotid duplex 05/20/16:  - Heterogeneous plaque, bilaterally. - Stable 1-39% RICA stenosis. - Stable LICA velocities, now in 40-59% range of stenosis. - Normal subclavian arteries, bilaterally. - Patent vertebral arteries with antegrade flow.  If no changes, I anticipate pt can proceed with surgery as scheduled.   Willeen Cass, FNP-BC [facility]/Anesthesiology Phone: [phone] 02/20/2017 3:06 PM

## 2017-02-21 ENCOUNTER — Telehealth: Payer: Self-pay

## 2017-02-21 MED ORDER — SAXAGLIPTIN HCL 5 MG PO TABS
5.0000 mg | ORAL_TABLET | Freq: Every day | ORAL | 2 refills | Status: DC
Start: 2017-02-21 — End: 2017-04-03

## 2017-02-21 NOTE — Telephone Encounter (Signed)
onglyza 5 mg #30  1 po qd , 2 refills  Inform pt please

## 2017-02-21 NOTE — Telephone Encounter (Signed)
PA for Januvia declined; covered meds are Onglyza or Tradjenta. Please advise.

## 2017-02-21 NOTE — Telephone Encounter (Signed)
PA initiated via Covermymeds; KEY: [id number]. Awaiting determination.

## 2017-02-21 NOTE — Telephone Encounter (Signed)
Rx sent. MyChart message sent to Pt.

## 2017-02-23 NOTE — H&P (Addendum)
Richard [name] Location: [facility] Patient #: [id number] DOB: 24-Feb-1941 Single / Language: Richard [name] / Race: White Male       History of Present Illness        The patient is a 76 year old male who presents with a complaint of adenopathy, rule out lymphoma. This is a 76 year old man, referred by Dr. Burney Gauze for evaluation and management of adenopathy. Lymphoma is suspected. Dr. Percival Spanish is his cardiologist. Dr. Garnet Koyanagi is his PCP       He has been followed for anemia by Dr. Marin Olp for some time. Has received IV iron and B12. He feels a lump under his left arm which is nontender. He's had some neck pain. MRI and PET scan show axillary and subpectoral lymph nodes bilaterally to be enlarged. Left side is 3.2 cm. Also some subcarinal and retrocrural lymph nodes light up on PET scan. Heart is enlarged. Tiny retroperitoneal nodes. PET-positive C3 lesion.      Comorbidities include coronary artery disease. CABG in past. Apparently has moderate to severe aortic stenosis but he is asymptomatic. Plays golf every day although the last 2 weeks he slowed down a little bit. Was actually seen by Dr. Rosezella Florida PA this morning. No interventions planned. We will need cardiac risk assessment for anesthesia. [name] GERD, chronic. Hypertension. Hyperlipidemia. Sleep apnea on CPAP.       Family history reveals mother died of pancreatic cancer. No family history of hematologic malignancies. Sister survived breast cancer. Social history reveals he's been single for 35 years. Plays golf every day and she is in the low 70s. 2 children. No tobacco. No alcohol for 12 years.      The only significant peripheral lymph node palpable was in the left axilla As soon as we can get cardiac clearance he will be scheduled for excision deep left axillary lymph node for lymphoma workup I discussed the indications, details, techniques, and numerous risk of the surgery with the patient and  his daughter. He is aware the risk of bleeding, infection, nerve damage with chronic numbness, arm swelling, seroma or lymphocele formation, and other unforeseen problems. He understands all these issues well. All of his questions were answered. He agrees with this plan.   Past Surgical History  Appendectomy  Bypass Surgery for Poor Blood Flow to Legs  Cataract Surgery  Bilateral. Knee Surgery  Left. Shoulder Surgery  Right. Spinal Surgery - Lower Back  Tonsillectomy   Diagnostic Studies History  Colonoscopy  1-5 years ago  Medication History Lyrica (75MG  Capsule, Oral) Active. Allopurinol (300MG  Tablet, Oral) Active. AmLODIPine Besylate (5MG  Tablet, Oral) Active. Atorvastatin Calcium (80MG  Tablet, Oral) Active. Durezol (0.05% Emulsion, Ophthalmic) Active. Ezetimibe (10MG  Tablet, Oral) Active. Fluticasone Propionate (50MCG/ACT Suspension, Nasal) Active. Furosemide (40MG  Tablet, Oral) Active. Glimepiride (2MG  Tablet, Oral) Active. Klor-Con M20 [facility] Tablet ER, Oral) Active. MetFORMIN HCl (1000MG  Tablet, Oral) Active. Metoprolol Tartrate (25MG  Tablet, Oral) Active. Nitroglycerin (0.4MG  Tab Sublingual, Sublingual) Active. Shingrix (50MCG For Suspension, Intramuscular) Active. TiZANidine HCl (4MG  Tablet, Oral) Active. Medications Reconciled  Social History  Alcohol use  Occasional alcohol use. Caffeine use  Carbonated beverages. No drug use  Tobacco use  Never smoker.  Family History  Alcohol Abuse  Father. Arthritis  Father, Mother. Cancer  Mother. Heart disease in male family member before age 72  Heart disease in male family member before age 22  Hypertension  Father, Mother.  Other Problems  Arthritis  Back Pain  Diabetes Mellitus  Gastroesophageal Reflux Disease  Heart murmur  High blood pressure  Hypercholesterolemia  Sleep Apnea     Review of Systems  General Not Present- Appetite Loss, Chills, Fatigue,  Fever, Night Sweats, Weight Gain and Weight Loss. Skin Not Present- Change in Wart/Mole, Dryness, Hives, Jaundice, New Lesions, Non-Healing Wounds, Rash and Ulcer. HEENT Not Present- Earache, Hearing Loss, Hoarseness, Nose Bleed, Oral Ulcers, Ringing in the Ears, Seasonal Allergies, Sinus Pain, Sore Throat, Visual Disturbances, Wears glasses/contact lenses and Yellow Eyes. Respiratory Not Present- Bloody sputum, Chronic Cough, Difficulty Breathing, Snoring and Wheezing. Breast Not Present- Breast Mass, Breast Pain, Nipple Discharge and Skin Changes. Cardiovascular Not Present- Chest Pain, Difficulty Breathing Lying Down, Leg Cramps, Palpitations, Rapid Heart Rate, Shortness of Breath and Swelling of Extremities. Gastrointestinal Not Present- Abdominal Pain, Bloating, Bloody Stool, Change in Bowel Habits, Chronic diarrhea, Constipation, Difficulty Swallowing, Excessive gas, Gets full quickly at meals, Hemorrhoids, Indigestion, Nausea, Rectal Pain and Vomiting. Male Genitourinary Not Present- Blood in Urine, Change in Urinary Stream, Frequency, Impotence, Nocturia, Painful Urination, Urgency and Urine Leakage. Neurological Not Present- Decreased Memory, Fainting, Headaches, Numbness, Seizures, Tingling, Tremor, Trouble walking and Weakness. Psychiatric Not Present- Anxiety, Bipolar, Change in Sleep Pattern, Depression, Fearful and Frequent crying. Endocrine Not Present- Cold Intolerance, Excessive Hunger, Hair Changes, Heat Intolerance, Hot flashes and New Diabetes. Hematology Not Present- Blood Thinners, Easy Bruising, Excessive bleeding, Gland problems, HIV and Persistent Infections.  Vitals  Weight: 255.2 lb Height: 72in Body Surface Area: 2.36 m Body Mass Index: 34.61 kg/m  Temp.: 98.32F  Pulse: 98 (Regular)  BP: 144/84 (Sitting, Left Arm, Standard)    Physical Exam General Mental Status-Alert. General Appearance-Consistent with stated age. Hydration-Well  hydrated. Voice-Normal.  Integumentary Note: Lots of tanning. Actinic keratoses and pigmented nevi everywhere   Head and Neck Head-normocephalic, atraumatic with no lesions or palpable masses. Trachea-midline. Thyroid Gland Characteristics - normal size and consistency. Note: No significant adenopathy in the neck. Neck is thick but soft.   Eye Eyeball - Bilateral-Extraocular movements intact. Sclera/Conjunctiva - Bilateral-No scleral icterus.  Chest and Lung Exam Chest and lung exam reveals -quiet, even and easy respiratory effort with no use of accessory muscles and on auscultation, normal breath sounds, no adventitious sounds and normal vocal resonance. Inspection Chest Wall - Normal. Back - normal. Note: Golf ball sized palpable mass left axilla. Mobile. Nontender. No palpable mass right axilla.   Cardiovascular Cardiovascular examination reveals -normal heart sounds, regular rate and rhythm with no murmurs and normal pedal pulses bilaterally. Note: Grade 4 systolic aortic murmur from stenosis presumably  Abdomen Inspection Inspection of the abdomen reveals - No Hernias. Skin - Scar - no surgical scars. Palpation/Percussion Palpation and Percussion of the abdomen reveal - Soft, Non Tender, No Rebound tenderness, No Rigidity (guarding) and No hepatosplenomegaly. Auscultation Auscultation of the abdomen reveals - Bowel sounds normal.  Neurologic Neurologic evaluation reveals -alert and oriented x 3 with no impairment of recent or remote memory. Mental Status-Normal.  Musculoskeletal Normal Exam - Left-Upper Extremity Strength Normal and Lower Extremity Strength Normal. Normal Exam - Right-Upper Extremity Strength Normal and Lower Extremity Strength Normal.  Lymphatic Head & Neck  General Head & Neck Lymphatics: Bilateral - Description - Normal. Axillary  General Axillary Region: Bilateral - Description - Note: 3.5 cm mobile nontender mass  left axilla. Right side seems okay. Tenderness - Non Tender. Femoral & Inguinal  Generalized Femoral & Inguinal Lymphatics: Bilateral - Description - Normal. Tenderness - Non Tender.    Assessment & Plan  AXILLARY ADENOPATHY (R59.0)  Your x-rays and physical exam suggests that multiple lymph nodes are enlarged There is a possibility of lymphoma and a biopsy needs to be done to determine whether this is true or not       you'll be scheduled for excision deep left axillary lymph node under general anesthesia in the near future you will be able to go home the same day We have discussed the indications, techniques, and risks of the surgery in detail      Stop taking the aspirin 2 days preop We will check with Dr. Percival Spanish to make sure that he agrees that the risk is acceptable, considering your heart disease  HISTORY OF CORONARY ARTERY BYPASS GRAFT (Z95.1) AORTIC STENOSIS, MODERATE (I35.0) BMI 34.0-34.9,ADULT (Z68.34) HYPERTENSION, BENIGN (I10) CHRONIC GERD (K21.9) SLEEP APNEA IN ADULT (G47.30)   Anecia Nusbaum M. Dalbert Batman, M.D., [facility] General and Minimally invasive Surgery Breast and Colorectal Surgery Office:   [phone] Pager:   [phone]

## 2017-02-24 ENCOUNTER — Other Ambulatory Visit: Payer: Self-pay | Admitting: Family Medicine

## 2017-02-24 NOTE — Telephone Encounter (Signed)
Received records- will place in Dr. Ethel Rana red folder for when he returns to office.

## 2017-02-25 ENCOUNTER — Encounter (HOSPITAL_COMMUNITY): Payer: Self-pay

## 2017-02-25 ENCOUNTER — Ambulatory Visit (HOSPITAL_COMMUNITY): Payer: Medicare Other | Admitting: Certified Registered Nurse Anesthetist

## 2017-02-25 ENCOUNTER — Encounter (HOSPITAL_COMMUNITY)
Admission: RE | Disposition: A | Discharge status: Home / Self Care | Payer: Self-pay | Source: Ambulatory Visit | Attending: General Surgery

## 2017-02-25 ENCOUNTER — Ambulatory Visit (HOSPITAL_COMMUNITY)
Admission: RE | Admit: 2017-02-25 | Discharge: 2017-02-25 | Disposition: A | Discharge status: Home / Self Care | Payer: Medicare Other | Source: Ambulatory Visit | Attending: General Surgery | Admitting: General Surgery

## 2017-02-25 ENCOUNTER — Other Ambulatory Visit: Payer: Self-pay

## 2017-02-25 ENCOUNTER — Ambulatory Visit (HOSPITAL_COMMUNITY): Payer: Medicare Other | Admitting: Emergency Medicine

## 2017-02-25 DIAGNOSIS — Z951 Presence of aortocoronary bypass graft: Secondary | ICD-10-CM | POA: Diagnosis not present

## 2017-02-25 DIAGNOSIS — E119 Type 2 diabetes mellitus without complications: Secondary | ICD-10-CM | POA: Diagnosis not present

## 2017-02-25 DIAGNOSIS — Z6834 Body mass index (BMI) 34.0-34.9, adult: Secondary | ICD-10-CM | POA: Insufficient documentation

## 2017-02-25 DIAGNOSIS — R590 Localized enlarged lymph nodes: Secondary | ICD-10-CM | POA: Diagnosis not present

## 2017-02-25 DIAGNOSIS — I10 Essential (primary) hypertension: Secondary | ICD-10-CM | POA: Diagnosis not present

## 2017-02-25 DIAGNOSIS — Z7984 Long term (current) use of oral hypoglycemic drugs: Secondary | ICD-10-CM | POA: Diagnosis not present

## 2017-02-25 DIAGNOSIS — M48061 Spinal stenosis, lumbar region without neurogenic claudication: Secondary | ICD-10-CM | POA: Diagnosis not present

## 2017-02-25 DIAGNOSIS — Z803 Family history of malignant neoplasm of breast: Secondary | ICD-10-CM | POA: Insufficient documentation

## 2017-02-25 DIAGNOSIS — K219 Gastro-esophageal reflux disease without esophagitis: Secondary | ICD-10-CM | POA: Diagnosis not present

## 2017-02-25 DIAGNOSIS — Z800 Family history of malignant neoplasm of digestive organs: Secondary | ICD-10-CM | POA: Diagnosis not present

## 2017-02-25 DIAGNOSIS — E7800 Pure hypercholesterolemia, unspecified: Secondary | ICD-10-CM | POA: Diagnosis not present

## 2017-02-25 DIAGNOSIS — D509 Iron deficiency anemia, unspecified: Secondary | ICD-10-CM | POA: Diagnosis not present

## 2017-02-25 DIAGNOSIS — Z79899 Other long term (current) drug therapy: Secondary | ICD-10-CM | POA: Diagnosis not present

## 2017-02-25 DIAGNOSIS — C8334 Diffuse large B-cell lymphoma, lymph nodes of axilla and upper limb: Secondary | ICD-10-CM | POA: Insufficient documentation

## 2017-02-25 DIAGNOSIS — I2510 Atherosclerotic heart disease of native coronary artery without angina pectoris: Secondary | ICD-10-CM | POA: Diagnosis not present

## 2017-02-25 DIAGNOSIS — G4730 Sleep apnea, unspecified: Secondary | ICD-10-CM | POA: Diagnosis not present

## 2017-02-25 HISTORY — DX: Localized enlarged lymph nodes: R59.0

## 2017-02-25 HISTORY — PX: HYDRADENITIS EXCISION: SHX5243

## 2017-02-25 LAB — GLUCOSE, CAPILLARY
GLUCOSE-CAPILLARY: 168 mg/dL — AB (ref 65–99)
Glucose-Capillary: 147 mg/dL — ABNORMAL HIGH (ref 65–99)

## 2017-02-25 SURGERY — EXCISION, HIDRADENITIS, AXILLA
Anesthesia: General | Site: Axilla | Laterality: Left

## 2017-02-25 MED ORDER — LACTATED RINGERS IV SOLN: INTRAVENOUS | Status: DC

## 2017-02-25 MED ORDER — OXYCODONE HCL 5 MG PO TABS
ORAL_TABLET | ORAL | Status: AC
  Filled 2017-02-25: qty 1

## 2017-02-25 MED ORDER — SODIUM CHLORIDE 0.9% FLUSH
3.0000 mL | INTRAVENOUS | Status: DC | PRN
Start: 2017-02-25 — End: 2017-02-25

## 2017-02-25 MED ORDER — ONDANSETRON HCL 4 MG/2ML IJ SOLN
INTRAMUSCULAR | Status: DC | PRN
  Administered 2017-02-25: 4 mg via INTRAVENOUS

## 2017-02-25 MED ORDER — SODIUM CHLORIDE 0.9 % IV SOLN: 250.0000 mL | INTRAVENOUS | Status: DC | PRN

## 2017-02-25 MED ORDER — LACTATED RINGERS IV SOLN
INTRAVENOUS | Status: DC
  Administered 2017-02-25 (×2): via INTRAVENOUS

## 2017-02-25 MED ORDER — ACETAMINOPHEN 650 MG RE SUPP: 650.0000 mg | RECTAL | Status: DC | PRN

## 2017-02-25 MED ORDER — FENTANYL CITRATE (PF) 100 MCG/2ML IJ SOLN
INTRAMUSCULAR | Status: DC | PRN
  Administered 2017-02-25: 25 ug via INTRAVENOUS
  Administered 2017-02-25: 50 ug via INTRAVENOUS
  Administered 2017-02-25: 25 ug via INTRAVENOUS

## 2017-02-25 MED ORDER — DEXAMETHASONE SODIUM PHOSPHATE 10 MG/ML IJ SOLN
INTRAMUSCULAR | Status: AC
  Filled 2017-02-25: qty 1

## 2017-02-25 MED ORDER — OXYCODONE HCL 5 MG/5ML PO SOLN: 5.0000 mg | Freq: Once | ORAL | Status: AC | PRN

## 2017-02-25 MED ORDER — SODIUM CHLORIDE 0.9% FLUSH: 3.0000 mL | Freq: Two times a day (BID) | INTRAVENOUS | Status: DC

## 2017-02-25 MED ORDER — FENTANYL CITRATE (PF) 100 MCG/2ML IJ SOLN: 25.0000 ug | INTRAMUSCULAR | Status: DC | PRN

## 2017-02-25 MED ORDER — ACETAMINOPHEN 325 MG PO TABS: 650.0000 mg | ORAL_TABLET | ORAL | Status: DC | PRN

## 2017-02-25 MED ORDER — OXYCODONE HCL 5 MG PO TABS
5.0000 mg | ORAL_TABLET | Freq: Once | ORAL | Status: AC | PRN
  Administered 2017-02-25: 5 mg via ORAL

## 2017-02-25 MED ORDER — CEFAZOLIN SODIUM-DEXTROSE 2-4 GM/100ML-% IV SOLN
INTRAVENOUS | Status: AC
  Filled 2017-02-25: qty 100

## 2017-02-25 MED ORDER — ACETAMINOPHEN 500 MG PO TABS
ORAL_TABLET | ORAL | Status: AC
  Filled 2017-02-25: qty 2

## 2017-02-25 MED ORDER — SODIUM CHLORIDE 0.9 % IJ SOLN
INTRAMUSCULAR | Status: AC
  Filled 2017-02-25: qty 10

## 2017-02-25 MED ORDER — MIDAZOLAM HCL 2 MG/2ML IJ SOLN
INTRAMUSCULAR | Status: AC
  Filled 2017-02-25: qty 2

## 2017-02-25 MED ORDER — BUPIVACAINE-EPINEPHRINE (PF) 0.25% -1:200000 IJ SOLN
INTRAMUSCULAR | Status: AC
  Filled 2017-02-25: qty 30

## 2017-02-25 MED ORDER — BUPIVACAINE-EPINEPHRINE 0.25% -1:200000 IJ SOLN
INTRAMUSCULAR | Status: DC | PRN
  Administered 2017-02-25: 8 mL

## 2017-02-25 MED ORDER — METHYLENE BLUE 0.5 % INJ SOLN
INTRAVENOUS | Status: AC
  Filled 2017-02-25: qty 10

## 2017-02-25 MED ORDER — ACETAMINOPHEN 500 MG PO TABS
1000.0000 mg | ORAL_TABLET | ORAL | Status: AC
Start: 2017-02-25 — End: 2017-02-25
  Administered 2017-02-25: 1000 mg via ORAL

## 2017-02-25 MED ORDER — CEFAZOLIN SODIUM-DEXTROSE 2-4 GM/100ML-% IV SOLN
2.0000 g | INTRAVENOUS | Status: AC
  Administered 2017-02-25: 2 g via INTRAVENOUS

## 2017-02-25 MED ORDER — OXYCODONE HCL 5 MG PO TABS: 5.0000 mg | ORAL_TABLET | ORAL | Status: DC | PRN

## 2017-02-25 MED ORDER — ONDANSETRON HCL 4 MG/2ML IJ SOLN
INTRAMUSCULAR | Status: AC
  Filled 2017-02-25: qty 2

## 2017-02-25 MED ORDER — GABAPENTIN 300 MG PO CAPS
300.0000 mg | ORAL_CAPSULE | ORAL | Status: AC
  Administered 2017-02-25: 300 mg via ORAL

## 2017-02-25 MED ORDER — HYDROCODONE-ACETAMINOPHEN 5-325 MG PO TABS: 1.0000 | ORAL_TABLET | Freq: Four times a day (QID) | ORAL | 0 refills | Status: DC | PRN

## 2017-02-25 MED ORDER — 0.9 % SODIUM CHLORIDE (POUR BTL) OPTIME
TOPICAL | Status: DC | PRN
  Administered 2017-02-25: 1000 mL

## 2017-02-25 MED ORDER — DEXAMETHASONE SODIUM PHOSPHATE 10 MG/ML IJ SOLN
INTRAMUSCULAR | Status: DC | PRN
  Administered 2017-02-25: 10 mg via INTRAVENOUS

## 2017-02-25 MED ORDER — PROPOFOL 10 MG/ML IV BOLUS
INTRAVENOUS | Status: DC | PRN
  Administered 2017-02-25: 40 mg via INTRAVENOUS
  Administered 2017-02-25 (×2): 20 mg via INTRAVENOUS
  Administered 2017-02-25: 60 mg via INTRAVENOUS

## 2017-02-25 MED ORDER — PHENYLEPHRINE 40 MCG/ML (10ML) SYRINGE FOR IV PUSH (FOR BLOOD PRESSURE SUPPORT)
PREFILLED_SYRINGE | INTRAVENOUS | Status: AC
  Filled 2017-02-25: qty 10

## 2017-02-25 MED ORDER — GABAPENTIN 300 MG PO CAPS
ORAL_CAPSULE | ORAL | Status: AC
  Filled 2017-02-25: qty 1

## 2017-02-25 MED ORDER — CHLORHEXIDINE GLUCONATE CLOTH 2 % EX PADS: 6.0000 | MEDICATED_PAD | Freq: Once | CUTANEOUS | Status: DC

## 2017-02-25 MED ORDER — PHENYLEPHRINE HCL 10 MG/ML IJ SOLN
INTRAMUSCULAR | Status: DC | PRN
  Administered 2017-02-25: 80 ug via INTRAVENOUS
  Administered 2017-02-25: 40 ug via INTRAVENOUS

## 2017-02-25 MED ORDER — FENTANYL CITRATE (PF) 250 MCG/5ML IJ SOLN
INTRAMUSCULAR | Status: AC
  Filled 2017-02-25: qty 5

## 2017-02-25 SURGICAL SUPPLY — 39 items
ADH SKN CLS APL DERMABOND .7 (GAUZE/BANDAGES/DRESSINGS) ×1
APPLIER CLIP 9.375 MED OPEN (MISCELLANEOUS) ×3
APR CLP MED 9.3 20 MLT OPN (MISCELLANEOUS) ×1
CHLORAPREP W/TINT 26ML (MISCELLANEOUS) ×3 IMPLANT
CLIP APPLIE 9.375 MED OPEN (MISCELLANEOUS) IMPLANT
CONT SPEC 4OZ CLIKSEAL STRL BL (MISCELLANEOUS) ×5 IMPLANT
COVER SURGICAL LIGHT HANDLE (MISCELLANEOUS) ×3 IMPLANT
DERMABOND ADVANCED (GAUZE/BANDAGES/DRESSINGS) ×2
DERMABOND ADVANCED .7 DNX12 (GAUZE/BANDAGES/DRESSINGS) ×1 IMPLANT
DRAPE CHEST BREAST 15X10 FENES (DRAPES) ×3 IMPLANT
DRAPE UTILITY XL STRL (DRAPES) ×4 IMPLANT
ELECT CAUTERY BLADE 6.4 (BLADE) ×3 IMPLANT
ELECT REM PT RETURN 9FT ADLT (ELECTROSURGICAL) ×3
ELECTRODE REM PT RTRN 9FT ADLT (ELECTROSURGICAL) ×1 IMPLANT
GLOVE BIOGEL PI IND STRL 6.5 (GLOVE) IMPLANT
GLOVE BIOGEL PI INDICATOR 6.5 (GLOVE) ×6
GLOVE EUDERMIC 7 POWDERFREE (GLOVE) ×3 IMPLANT
GLOVE SURG SS PI 6.0 STRL IVOR (GLOVE) ×4 IMPLANT
GOWN STRL REUS W/ TWL LRG LVL3 (GOWN DISPOSABLE) IMPLANT
GOWN STRL REUS W/ TWL XL LVL3 (GOWN DISPOSABLE) ×1 IMPLANT
GOWN STRL REUS W/TWL LRG LVL3 (GOWN DISPOSABLE) ×6
GOWN STRL REUS W/TWL XL LVL3 (GOWN DISPOSABLE) ×3
KIT BASIN OR (CUSTOM PROCEDURE TRAY) ×3 IMPLANT
KIT ROOM TURNOVER OR (KITS) ×3 IMPLANT
NDL 18GX1X1/2 (RX/OR ONLY) (NEEDLE) ×1 IMPLANT
NDL FILTER BLUNT 18X1 1/2 (NEEDLE) IMPLANT
NDL HYPO 25GX1X1/2 BEV (NEEDLE) ×2 IMPLANT
NEEDLE 18GX1X1/2 (RX/OR ONLY) (NEEDLE) IMPLANT
NEEDLE FILTER BLUNT 18X 1/2SAF (NEEDLE)
NEEDLE FILTER BLUNT 18X1 1/2 (NEEDLE) IMPLANT
NEEDLE HYPO 25GX1X1/2 BEV (NEEDLE) ×3 IMPLANT
NS IRRIG 1000ML POUR BTL (IV SOLUTION) ×3 IMPLANT
PACK GENERAL/GYN (CUSTOM PROCEDURE TRAY) ×3 IMPLANT
PAD ARMBOARD 7.5X6 YLW CONV (MISCELLANEOUS) ×3 IMPLANT
STAPLER VISISTAT 35W (STAPLE) ×3 IMPLANT
SUT MNCRL AB 4-0 PS2 18 (SUTURE) ×3 IMPLANT
SUT VIC AB 3-0 SH 18 (SUTURE) ×3 IMPLANT
SYR CONTROL 10ML LL (SYRINGE) ×4 IMPLANT
TOWEL OR 17X24 6PK STRL BLUE (TOWEL DISPOSABLE) ×3 IMPLANT

## 2017-02-25 NOTE — Op Note (Signed)
Patient Name:           Richard [name]   Date of Surgery:        02/25/2017  Pre op Diagnosis:     Left axillary adenopathy   Post op Diagnosis:    Same  Procedure:                 Excision deep left axillary mass  Surgeon:                     Edsel Petrin. Dalbert Batman, M.D., FACS  Assistant:                      Or staff   Indication for Assistant: N/A  Operative Indications:  This is a 76 year old man, referred by Dr. Burney Gauze for evaluation and management of adenopathy. Lymphoma is suspected. Dr. Percival Spanish is his cardiologist. Dr. Garnet Koyanagi is his PCP       He has been followed for anemia by Dr. Marin Olp for some time.He feels a lump under his left arm which is nontender. He's had some neck pain. MRI and PET scan show axillary and subpectoral lymph nodes bilaterally to be enlarged. Left side is 3.2 cm. and palpable. Also some subcarinal and retrocrural lymph nodes light up on PET scan. Heart is enlarged. Tiny retroperitoneal nodes. PET-positive C3 lesion.      Comorbidities include coronary artery disease. CABG in past. Apparently has moderate to severe aortic stenosis but he is asymptomatic. Plays golf every day although the last 2 weeks he slowed down a little bit. Was actually seen by Dr. Rosezella Florida PA last week. No interventions planned. . . GERD, chronic. Hypertension. Hyperlipidemia. Sleep apnea on CPAP.       Family history reveals mother died of pancreatic cancer. No family history of hematologic malignancies. Sister survived breast cancer.      The only significant peripheral lymph node palpable was in the left axilla I discussed the indications, details, techniques, and numerous risk of the surgery with the patient and his daughter.d. He agrees with this plan  Operative Findings:       There was a 3.5 cm mobile, seemingly solitary purplish mass in the left axilla consistent with a pathologic lymph node.  Lymphoma is suspected grossly.  Procedure in Detail:           Following the induction of general LMA anesthesia the patient's left axilla was prepped and draped in a sterile fashion.  Surgical timeout was performed.  Intravenous antibiotics were given.  0.5% Marcaine with epinephrine was used as a local infiltration anesthetic.      A transverse incision was made in the left axilla just above the hairline.  Dissection was carried down through the subcutaneous tissue and the clavi-pectoral fascia was incised.  I dissected deeply within the axilla and dissected out the palpable mass.  The venous tributaries and lymphatics were controlled metal clips and divided.  The mass was sent fresh to the lab for urgent analysis.  Hemostasis was excellent and achieved with metal clips and electrocautery.  The wound was irrigated.  Clavipectoral fascia was closed with 3-0 Vicryl sutures and the skin closed with a running subcuticular 4-0 Monocryl and Dermabond.  The patient tolerated the procedure well was taken to PACU in stable condition.  EBL 15 mL.  Counts correct.  Complications none.     Edsel Petrin. Dalbert Batman, M.D., FACS General and Minimally Invasive Surgery Breast and Colorectal  Surgery   Addendum: I logged onto the [facility]  website and reviewed his prescription medication history  02/25/2017 1:11 PM

## 2017-02-25 NOTE — Transfer of Care (Signed)
Immediate Anesthesia Transfer of Care Note  Patient: KYI ROMANELLO  Procedure(s) Performed: Procedure(s): EXCISION DEEP LEFT AXILLARY LYMPH NODE (Left)  Patient Location: PACU  Anesthesia Type:General  Level of Consciousness: awake, oriented, patient cooperative and responds to stimulation  Airway & Oxygen Therapy: Patient Spontanous Breathing and Patient connected to nasal cannula oxygen  Post-op Assessment: Report given to RN and Post -op Vital signs reviewed and stable  Post vital signs: Reviewed and stable  Last Vitals:  Vitals:   02/25/17 1142 02/25/17 1318  BP: 126/69 108/72  Pulse: 90 (!) 57  Resp: 18   Temp: 36.7 C   SpO2: 98%     Last Pain:  Vitals:   02/25/17 1142  TempSrc: Oral  PainSc:       Patients Stated Pain Goal: 2 (73/42/87 6811)  Complications: No apparent anesthesia complications

## 2017-02-25 NOTE — Anesthesia Preprocedure Evaluation (Signed)
Anesthesia Evaluation  Patient identified by MRN, date of birth, ID band Patient awake    Reviewed: Allergy & Precautions, NPO status , Patient's Chart, lab work & pertinent test results, reviewed documented beta blocker date and time   History of Anesthesia Complications Negative for: history of anesthetic complications  Airway Mallampati: II  TM Distance: >3 FB Neck ROM: Limited    Dental  (+) Teeth Intact   Pulmonary shortness of breath, sleep apnea ,    breath sounds clear to auscultation       Cardiovascular hypertension, Pt. on medications and Pt. on home beta blockers + CAD, + CABG and + Peripheral Vascular Disease  + dysrhythmias + Valvular Problems/Murmurs  Rhythm:Regular     Neuro/Psych  Headaches, PSYCHIATRIC DISORDERS Anxiety  Neuromuscular disease    GI/Hepatic Neg liver ROS, hiatal hernia, GERD  Medicated and Controlled,  Endo/Other  diabetes, Type 2Morbid obesity  Renal/GU negative Renal ROS     Musculoskeletal  (+) Arthritis ,   Abdominal   Peds  Hematology negative hematology ROS (+)   Anesthesia Other Findings CABG 2001, vessels patent, severe AS, nl EF  Reproductive/Obstetrics                            Anesthesia Physical Anesthesia Plan  ASA: IV  Anesthesia Plan: General   Post-op Pain Management:    Induction: Intravenous  PONV Risk Score and Plan: 2 and Ondansetron and Dexamethasone  Airway Management Planned: LMA  Additional Equipment: None  Intra-op Plan:   Post-operative Plan: Extubation in OR  Informed Consent: I have reviewed the patients History and Physical, chart, labs and discussed the procedure including the risks, benefits and alternatives for the proposed anesthesia with the patient or authorized representative who has indicated his/her understanding and acceptance.   Dental advisory given  Plan Discussed with: CRNA and  Surgeon  Anesthesia Plan Comments:         Anesthesia Quick Evaluation

## 2017-02-25 NOTE — Interval H&P Note (Signed)
History and Physical Interval Note:  02/25/2017 11:31 AM  Richard [name]  has presented today for surgery, with the diagnosis of left axillary adenopathy  The various methods of treatment have been discussed with the patient and family. After consideration of risks, benefits and other options for treatment, the patient has consented to  Procedure(s): EXCISION DEEP LEFT AXILLARY LYMPH NODE (Left) as a surgical intervention .  The patient's history has been reviewed, patient examined, no change in status, stable for surgery.  I have reviewed the patient's chart and labs.  Questions were answered to the patient's satisfaction.     [name]

## 2017-02-25 NOTE — Discharge Instructions (Signed)
Ice pack to wound, intermittently, for 24 hours  You may shower starting tomorrow No tub baths or swimming pools  You may drive a car in 2-4 days if you are comfortable  The clear plastic superglue covering the wound will protect it.  It will flake off in 3-4 weeks  Dr. Dalbert Batman will call the preliminary report to you by Friday  See Dr. Dalbert Batman in the office in 2 weeks to check the wound

## 2017-02-25 NOTE — Transfer of Care (Signed)
Immediate Anesthesia Transfer of Care Note  Patient: Richard [name]  Procedure(s) Performed: Procedure(s): EXCISION DEEP LEFT AXILLARY LYMPH NODE (Left)  Patient Location: PACU  Anesthesia Type:General  Level of Consciousness: awake, oriented, patient cooperative and responds to stimulation  Airway & Oxygen Therapy: Patient Spontanous Breathing and Patient connected to nasal cannula oxygen  Post-op Assessment: Report given to RN, Post -op Vital signs reviewed and stable and Patient moving all extremities X 4  Post vital signs: Reviewed and stable  Last Vitals:  Vitals:   02/25/17 1142 02/25/17 1318  BP: 126/69 108/72  Pulse: 90 (!) 57  Resp: 18   Temp: 36.7 C   SpO2: 98%     Last Pain:  Vitals:   02/25/17 1142  TempSrc: Oral  PainSc:       Patients Stated Pain Goal: 2 (27/67/01 1003)  Complications: No apparent anesthesia complications

## 2017-02-26 ENCOUNTER — Encounter (HOSPITAL_COMMUNITY): Payer: Self-pay | Admitting: General Surgery

## 2017-02-26 ENCOUNTER — Other Ambulatory Visit: Payer: Self-pay | Admitting: *Deleted

## 2017-02-26 MED ORDER — HYDROCODONE-ACETAMINOPHEN 5-325 MG PO TABS: 1.0000 | ORAL_TABLET | Freq: Four times a day (QID) | ORAL | 0 refills | Status: DC | PRN

## 2017-02-26 NOTE — Anesthesia Postprocedure Evaluation (Signed)
Anesthesia Post Note  Patient: Richard [name]  Procedure(s) Performed: Procedure(s) (LRB): EXCISION DEEP LEFT AXILLARY LYMPH NODE (Left)     Patient location during evaluation: PACU Anesthesia Type: General Level of consciousness: awake and alert Pain management: pain level controlled Vital Signs Assessment: post-procedure vital signs reviewed and stable Respiratory status: spontaneous breathing, nonlabored ventilation, respiratory function stable and patient connected to nasal cannula oxygen Cardiovascular status: blood pressure returned to baseline and stable Postop Assessment: no signs of nausea or vomiting Anesthetic complications: no    Last Vitals:  Vitals:   02/25/17 1353 02/25/17 1415  BP: (!) 124/58 131/67  Pulse: (!) 50 86  Resp: 18 18  Temp: 36.6 C   SpO2: 94% 94%    Last Pain:  Vitals:   02/25/17 1415  TempSrc:   PainSc: 0-No pain                 Neylan Koroma

## 2017-02-27 ENCOUNTER — Ambulatory Visit (HOSPITAL_BASED_OUTPATIENT_CLINIC_OR_DEPARTMENT_OTHER): Payer: Medicare Other | Admitting: Hematology & Oncology

## 2017-02-27 ENCOUNTER — Other Ambulatory Visit: Payer: Medicare Other

## 2017-02-27 ENCOUNTER — Ambulatory Visit (HOSPITAL_BASED_OUTPATIENT_CLINIC_OR_DEPARTMENT_OTHER): Payer: Medicare Other

## 2017-02-27 VITALS — BP 148/77 | HR 87 | Temp 98.3°F | Resp 18 | Wt 254.0 lb

## 2017-02-27 DIAGNOSIS — C8338 Diffuse large B-cell lymphoma, lymph nodes of multiple sites: Secondary | ICD-10-CM

## 2017-02-27 DIAGNOSIS — Z86010 Personal history of colonic polyps: Secondary | ICD-10-CM | POA: Diagnosis not present

## 2017-02-27 DIAGNOSIS — C8332 Diffuse large B-cell lymphoma, intrathoracic lymph nodes: Secondary | ICD-10-CM

## 2017-02-27 DIAGNOSIS — E1169 Type 2 diabetes mellitus with other specified complication: Secondary | ICD-10-CM | POA: Diagnosis not present

## 2017-02-27 DIAGNOSIS — K219 Gastro-esophageal reflux disease without esophagitis: Secondary | ICD-10-CM | POA: Diagnosis not present

## 2017-02-27 DIAGNOSIS — E538 Deficiency of other specified B group vitamins: Secondary | ICD-10-CM

## 2017-02-27 DIAGNOSIS — R748 Abnormal levels of other serum enzymes: Secondary | ICD-10-CM | POA: Diagnosis not present

## 2017-02-27 DIAGNOSIS — C8330 Diffuse large B-cell lymphoma, unspecified site: Secondary | ICD-10-CM | POA: Insufficient documentation

## 2017-02-27 DIAGNOSIS — R590 Localized enlarged lymph nodes: Secondary | ICD-10-CM | POA: Diagnosis not present

## 2017-02-27 DIAGNOSIS — D509 Iron deficiency anemia, unspecified: Secondary | ICD-10-CM

## 2017-02-27 DIAGNOSIS — E782 Mixed hyperlipidemia: Secondary | ICD-10-CM | POA: Diagnosis not present

## 2017-02-27 LAB — CBC WITH DIFFERENTIAL (CANCER CENTER ONLY)
BASO#: 0 10*3/uL (ref 0.0–0.2)
BASO%: 0.1 % (ref 0.0–2.0)
EOS%: 0.9 % (ref 0.0–7.0)
Eosinophils Absolute: 0.1 10*3/uL (ref 0.0–0.5)
HCT: 39.3 % (ref 38.7–49.9)
HGB: 13.3 g/dL (ref 13.0–17.1)
LYMPH#: 1.5 10*3/uL (ref 0.9–3.3)
LYMPH%: 15.1 % (ref 14.0–48.0)
MCH: 28.7 pg (ref 28.0–33.4)
MCHC: 33.8 g/dL (ref 32.0–35.9)
MCV: 85 fL (ref 82–98)
MONO#: 0.9 10*3/uL (ref 0.1–0.9)
MONO%: 8.7 % (ref 0.0–13.0)
NEUT#: 7.3 10*3/uL — ABNORMAL HIGH (ref 1.5–6.5)
NEUT%: 75.2 % (ref 40.0–80.0)
PLATELETS: 336 10*3/uL (ref 145–400)
RBC: 4.64 10*6/uL (ref 4.20–5.70)
RDW: 15.9 % — ABNORMAL HIGH (ref 11.1–15.7)
WBC: 9.8 10*3/uL (ref 4.0–10.0)

## 2017-02-27 LAB — CMP (CANCER CENTER ONLY)
ALK PHOS: 121 U/L — AB (ref 26–84)
ALT: 43 U/L (ref 10–47)
AST: 32 U/L (ref 11–38)
Albumin: 3.4 g/dL (ref 3.3–5.5)
BUN: 24 mg/dL — AB (ref 7–22)
CO2: 29 mEq/L (ref 18–33)
CREATININE: 1.1 mg/dL (ref 0.6–1.2)
Calcium: 9.9 mg/dL (ref 8.0–10.3)
Chloride: 102 mEq/L (ref 98–108)
GLUCOSE: 123 mg/dL — AB (ref 73–118)
Potassium: 3.6 mEq/L (ref 3.3–4.7)
SODIUM: 140 meq/L (ref 128–145)
TOTAL PROTEIN: 7.3 g/dL (ref 6.4–8.1)
Total Bilirubin: 0.7 mg/dl (ref 0.20–1.60)

## 2017-02-27 LAB — LACTATE DEHYDROGENASE: LDH: 204 U/L (ref 125–245)

## 2017-02-27 MED ORDER — HYDROCODONE-ACETAMINOPHEN 5-325 MG PO TABS: 1.0000 | ORAL_TABLET | Freq: Four times a day (QID) | ORAL | 0 refills | Status: DC | PRN

## 2017-02-27 MED ORDER — FAMCICLOVIR 500 MG PO TABS
500.0000 mg | ORAL_TABLET | Freq: Every day | ORAL | 12 refills | Status: DC
Start: 2017-02-27 — End: 2018-01-29

## 2017-02-27 MED ORDER — PREDNISONE 20 MG PO TABS: ORAL_TABLET | ORAL | 0 refills | Status: DC

## 2017-02-27 NOTE — Progress Notes (Signed)
START ON PATHWAY REGIMEN - Lymphoma and CLL     A cycle is every 21 days:     Rituximab      Cyclophosphamide      Doxorubicin      Vincristine      Prednisone   **Always confirm dose/schedule in your pharmacy ordering system**    Patient Characteristics: Diffuse Large B Cell Lymphoma, First Line, Stage III and IV Disease Type: Not Applicable Disease Type: Diffuse Large B Cell Line of therapy: First Line [place] Stage: IV Intent of Therapy: Curative Intent, Discussed with Patient

## 2017-02-28 ENCOUNTER — Other Ambulatory Visit: Payer: Self-pay | Admitting: General Surgery

## 2017-02-28 LAB — HEPATITIS PANEL, ACUTE
HBsAg Screen: NEGATIVE
HEP A IGM: NEGATIVE
Hep B Core Ab, IgM: NEGATIVE
Hep C Virus Ab: <0.1 s/co ratio (ref 0.0–0.9)

## 2017-02-28 NOTE — Progress Notes (Signed)
Hematology and Oncology Follow Up Visit  Richard [name] [id number] 1941/01/15 76 y.o. 02/28/2017   Principle Diagnosis:   Diffuse large cell non-Hodgkin's lymphoma (IPI = 3)  Current Therapy:   R-CHOP - cycle # 1 to start on 03/07/2016     Interim History   Richard [name] comes back for follow-up. We now have a whole new problem. He was complaining of a lot of neck pain. He saw his orthopedist. He went ahead and did some scans. Ultimately, his found to have lymphadenopathy. He had a bone scan which showed uptake in the cervical spine and right ankle.  He did have a PET scan done. I'm glad that this was ordered. Shockingly enough, the PET scan showed activity in the neck, chest and upper abdomen. These were in lymph nodes. He had hypermetabolic activity in the C3 vertebral body and left sacrum. There is no obvious activity in his organs. There is no splenomegaly.  He then underwent a biopsy of a left axillary lymph node. This is done on August 28. The pathology report ([id number]) show diffuse large B cell lymphoma. It was CD 20 positive.  I spoke to the pathologist. I told him to run the sample for C-MYC, BCL2 and BCL6 to make sure this is not a "double hit" lymphoma. Over the phone, the pathologist thought that she did not see histology that was consistent with a "double hit" lymphoma.  Richard [name] has a good performance status.  So far, his staging has shown that he likely has an IPI score of 3.  I think a bone marrow biopsy is necessary. He is not anemic. His LDH was 204.  He's had no problems with bowels or bladder. He's had no cough or shortness of breath.  He does have quite a bit of bony pain. This is helped with Vicodin.   He's had no headache.  His no rashes.  We have been seeing him for iron deficiency anemia and vitamin B-12 deficiency.  Overall, his performance status is ECOG 1.  Medications:  Current Outpatient Prescriptions:  .  allopurinol (ZYLOPRIM) 300 MG  tablet, Take 450 mg by mouth daily. , Disp: , Rfl:  .  amLODipine (NORVASC) 5 MG tablet, TAKE 2 TABLETS (10 MG TOTAL) BY MOUTH DAILY. (Patient taking differently: TAKE 1 TABLETS BY MOUTH TWICE DAILY.), Disp: 60 tablet, Rfl: 11 .  aspirin 81 MG tablet, Take 81 mg by mouth daily., Disp: , Rfl:  .  atorvastatin (LIPITOR) 80 MG tablet, TAKE ONE TABLET BY MOUTH AT BEDTIME, Disp: 90 tablet, Rfl: 3 .  esomeprazole (NEXIUM) 40 MG capsule, Take 1 capsule (40 mg total) by mouth daily., Disp: 30 capsule, Rfl: 5 .  ezetimibe (ZETIA) 10 MG tablet, TAKE ONE TABLET BY MOUTH DAILY, Disp: 90 tablet, Rfl: 3 .  famciclovir (FAMVIR) 500 MG tablet, Take 1 tablet (500 mg total) by mouth daily., Disp: 30 tablet, Rfl: 12 .  fenofibrate 160 MG tablet, TAKE 1 TABLET BY MOUTH EVERY DAY, Disp: 30 tablet, Rfl: 5 .  fluticasone (FLONASE) 50 MCG/ACT nasal spray, INHALE ONE SPRAY INTO EACH NOSTRIL TWICE DAILY. (Patient taking differently: INHALE ONE SPRAY INTO EACH NOSTRIL TWICE DAILY AS NEEDED FOR ALLERGIES), Disp: 1 g, Rfl: 6 .  FREESTYLE LITE test strip, TEST BLOOD SUGAR ONCE DAILY, Disp: 100 each, Rfl: 0 .  furosemide (LASIX) 40 MG tablet, TAKE 2 TABLETS (80 MG TOTAL) BY MOUTH 2 (TWO) TIMES DAILY., Disp: 360 tablet, Rfl: 0 .  glimepiride (AMARYL) 2 MG  tablet, TAKE 1 TABLET (2 MG TOTAL) BY MOUTH DAILY WITH BREAKFAST., Disp: 90 tablet, Rfl: 0 .  HYDROcodone-acetaminophen (NORCO) 5-325 MG tablet, Take 1-2 tablets by mouth every 6 (six) hours as needed for moderate pain or severe pain., Disp: 90 tablet, Rfl: 0 .  KLOR-CON M20 20 MEQ tablet, TAKE ONE TABLET BY MOUTH TWICE DAILY, Disp: 60 tablet, Rfl: 11 .  Lancets (FREESTYLE) lancets, Use once daily to check blood sugar.  DX E11.9, Disp: 100 each, Rfl: 6 .  Menthol, Topical Analgesic, (ICY HOT EX), Apply 1 application topically daily as needed (muscle pain)., Disp: , Rfl:  .  metFORMIN (GLUCOPHAGE) 1000 MG tablet, TAKE ONE AND ONE-HALF TABLETS BY MOUTH EVERY MORNING AND 1 TALBET IN  THE EVENING., Disp: 225 tablet, Rfl: 1 .  metoprolol tartrate (LOPRESSOR) 25 MG tablet, TAKE ONE-HALF TABLET BY MOUTH TWICE DAILY (Patient taking differently: TAKE ONE-HALF TABLET BY MOUTH IN THE MORNING AND HALF TABLET IN THE EVENING), Disp: 90 tablet, Rfl: 3 .  montelukast (SINGULAIR) 10 MG tablet, Take 1 tablet (10 mg total) by mouth at bedtime., Disp: 90 tablet, Rfl: 1 .  nitroGLYCERIN (NITROSTAT) 0.4 MG SL tablet, Place 1 tablet (0.4 mg total) under the tongue every 5 (five) minutes as needed for chest pain. (Patient not taking: Reported on 02/18/2017), Disp: 25 tablet, Rfl: 3 .  polycarbophil (FIBERCON) 625 MG tablet, Take 625 mg by mouth daily., Disp: , Rfl:  .  predniSONE (DELTASONE) 20 MG tablet, Take 3 pills a day, with food, for 5 days. Start the day after each chemotherapy, Disp: 120 tablet, Rfl: 0 .  pregabalin (LYRICA) 75 MG capsule, Take 75 mg by mouth 2 (two) times daily., Disp: , Rfl:  .  saxagliptin HCl (ONGLYZA) 5 MG TABS tablet, Take 1 tablet (5 mg total) by mouth daily., Disp: 30 tablet, Rfl: 2 .  traMADol (ULTRAM) 50 MG tablet, Take 1 tablet (50 mg total) by mouth every 6 (six) hours as needed., Disp: 30 tablet, Rfl: 1  Allergies:  Allergies  Allergen Reactions  . Benazepril Swelling    angioedema; he is not a candidate for any angiotensin receptor blockers because of this significant allergic reaction. Because of a history of documented adverse serious drug reaction;Medi Alert bracelet  is recommended  . Hctz [Hydrochlorothiazide] Anaphylaxis and Swelling    Tongue and lip swelling   . Aspirin Other (See Comments)    Gastritis, cant take 325 Mg aspirin   . Lactose Intolerance (Gi) Nausea And Vomiting    Past Medical History, Surgical history, Social history, and Family History were reviewed and updated.  Review of Systems: Review of Systems  Constitutional: Negative for appetite change, fatigue, fever and unexpected weight change.  HENT:   Negative for lump/mass,  mouth sores, sore throat and trouble swallowing.   Respiratory: Negative for cough, hemoptysis and shortness of breath.   Cardiovascular: Negative for leg swelling and palpitations.  Gastrointestinal: Negative for abdominal distention, abdominal pain, blood in stool, constipation, diarrhea, nausea and vomiting.  Genitourinary: Negative for bladder incontinence, dysuria, frequency and hematuria.   Musculoskeletal: Positive for back pain and neck pain. Negative for arthralgias, gait problem and myalgias.  Skin: Negative for itching and rash.  Neurological: Negative for dizziness, extremity weakness, gait problem, headaches, numbness, seizures and speech difficulty.  Hematological: Does not bruise/bleed easily.  Psychiatric/Behavioral: Negative for depression and sleep disturbance. The patient is not nervous/anxious.     Physical Exam:  weight is 254 lb (115.2 kg). His oral temperature  is 98.3 F (36.8 C). His blood pressure is 148/77 (abnormal) and his pulse is 87. His respiration is 18 and oxygen saturation is 93%.   Wt Readings from Last 3 Encounters:  02/27/17 254 lb (115.2 kg)  02/25/17 254 lb 6.4 oz (115.4 kg)  02/19/17 254 lb 6.4 oz (115.4 kg)    Physical Exam  Constitutional: He is oriented to person, place, and time.  HENT:  Head: Normocephalic and atraumatic.  Mouth/Throat: Oropharynx is clear and moist.  Eyes: Pupils are equal, round, and reactive to light. EOM are normal.  Neck: Normal range of motion.  Cardiovascular: Normal rate, regular rhythm and normal heart sounds.   Pulmonary/Chest: Effort normal and breath sounds normal.  Abdominal: Soft. Bowel sounds are normal.  Musculoskeletal: Normal range of motion. He exhibits no edema, tenderness or deformity.  Lymphadenopathy:    He has no cervical adenopathy.  Neurological: He is alert and oriented to person, place, and time.  Skin: Skin is warm and dry. No rash noted. No erythema.  Psychiatric: He has a normal mood and  affect. His behavior is normal. Judgment and thought content normal.  Vitals reviewed.    Lab Results  Component Value Date   WBC 9.8 02/27/2017   HGB 13.3 02/27/2017   HCT 39.3 02/27/2017   MCV 85 02/27/2017   PLT 336 02/27/2017     Chemistry      Component Value Date/Time   NA 140 02/27/2017 1350   K 3.6 02/27/2017 1350   CL 102 02/27/2017 1350   CO2 29 02/27/2017 1350   BUN 24 (H) 02/27/2017 1350   CREATININE 1.1 02/27/2017 1350      Component Value Date/Time   CALCIUM 9.9 02/27/2017 1350   ALKPHOS 121 (H) 02/27/2017 1350   AST 32 02/27/2017 1350   ALT 43 02/27/2017 1350   BILITOT 0.70 02/27/2017 1350         Impression and Plan: Richard [name] is a 76 year old white male. He has diffuse large B-cell lymphoma. This is a new diagnosis for him.  He clearly will need chemotherapy. He has a good performance status and I would think that he would tolerate systemic chemotherapy.  He will need to have a Port-A-Cath placed. Dr. Dalbert Batman will be able to do this for [place].  I spoke to he and his friend. I gave information sheets about R-CHOP protocol. I think this would be standard for diffuse large B cell lymphoma. I guess that the only caveat would be at this is a "double hit" lymphoma.   I do think we have to get going with treatment. I'll like to get started next week.  Of note, his hepatitis B is negative.  He did have an echocardiogram done back in July. He had an ejection fraction of 65 and 65%.  I think the only test that he will need is a bone marrow test. We will see about getting this set up for next week.  I did go ahead and give a prescription for Famvir. He already is on allopurinol. I don't think we have to worry about tumor lysis syndrome.   I went over the side effects of treatment. I told him about hair loss. I told him that he will need a white cell booster shot to help with neutropenia. I may consider putting him on prophylactic antibiotics.  I told him  about the possibility of fatigue, nausea/vomiting, rashes, mouth sores. He understands all this.  He wants to start it as soon  as possible.  I spent about 50 minutes with he and his friend. I answered all their questions. I reviewed his lab work and biopsy. I spent the majority of this time talking to him about chemotherapy and its side effects and its effectiveness.  I would like to have him come back about 10 days after chemotherapy so we can see what his blood counts are.     [name], MD 8/31/20188:24 AM

## 2017-03-03 NOTE — H&P (Signed)
Richard [name] [facility] 02/17/2017 11:40 AM Location: [facility] Patient #: [id number] DOB: 1940/07/02 Single / Language: Richard [name] / Race: White Male   History of Present Illness  . This is a 76 year old man, referred by Dr. Burney Gauze for evaluation and management of adenopathy.  Dr. Percival Spanish is his cardiologist. Dr. Garnet Koyanagi is his PCP       He has been followed for anemia by Dr. Marin Olp for some time. Has received IV iron and B12. He feels a lump under his left arm which is nontender. He's had some neck pain. MRI and PET scan show axillary and subpectoral lymph nodes bilaterally to be enlarged. Left side is 3.2 cm. Also some subcarinal and retrocrural lymph nodes light up on PET scan. Heart is enlarged. Tiny retroperitoneal nodes. PET-positive C3 lesion.      Comorbidities include coronary artery disease. CABG in past. Apparently has moderate to severe aortic stenosis but he is asymptomatic. Plays golf every day although the last 2 weeks he slowed down a little bit. Was actually seen by Dr. Cyndia Bent PA this morning. No interventions planned. We will need cardiac clearance. GERD, chronic. Hypertension. Hyperlipidemia. Sleep apnea on CPAP.      Family history reveals mother died of pancreatic cancer. No family history of hematologic malignancies. Sister survived breast cancer. Social history reveals he's been single for 35 years. Plays golf every day and she is in the low 70s. 2 children. No tobacco. No alcohol for 12 years.      The only significant peripheral lymph node palpable was in the left axilla Recent excisional biopsy reveals Diffuse Large B-Cell lymphoma.  Dr Marin Olp wishes to initiate chemotherapy immediately. I offered to insert port a cath and this was explained to the patient in detail.   Past Surgical History Appendectomy  Bypass Surgery for Poor Blood Flow to Legs  Cataract Surgery  Bilateral. Knee Surgery  Left. Shoulder Surgery   Right. Spinal Surgery - Lower Back  Tonsillectomy   Diagnostic Studies History  Colonoscopy  1-5 years ago  Medication History  Lyrica (75MG  Capsule, Oral) Active. Allopurinol (300MG  Tablet, Oral) Active. AmLODIPine Besylate (5MG  Tablet, Oral) Active. Atorvastatin Calcium (80MG  Tablet, Oral) Active. Durezol (0.05% Emulsion, Ophthalmic) Active. Ezetimibe (10MG  Tablet, Oral) Active. Fluticasone Propionate (50MCG/ACT Suspension, Nasal) Active. Furosemide (40MG  Tablet, Oral) Active. Glimepiride (2MG  Tablet, Oral) Active. Klor-Con M20 [facility] Tablet ER, Oral) Active. MetFORMIN HCl (1000MG  Tablet, Oral) Active. Metoprolol Tartrate (25MG  Tablet, Oral) Active. Nitroglycerin (0.4MG  Tab Sublingual, Sublingual) Active. Shingrix (50MCG For Suspension, Intramuscular) Active. TiZANidine HCl (4MG  Tablet, Oral) Active. Medications Reconciled  Social History  Alcohol use  Occasional alcohol use. Caffeine use  Carbonated beverages. No drug use  Tobacco use  Never smoker.  Family History  Alcohol Abuse  Father. Arthritis  Father, Mother. Cancer  Mother. Heart disease in male family member before age 54  Heart disease in male family member before age 67  Hypertension  Father, Mother.  Other Problems  Arthritis  Back Pain  Diabetes Mellitus  Gastroesophageal Reflux Disease  Heart murmur  High blood pressure  Hypercholesterolemia  Sleep Apnea     Review of Systems General Not Present- Appetite Loss, Chills, Fatigue, Fever, Night Sweats, Weight Gain and Weight Loss. Skin Not Present- Change in Wart/Mole, Dryness, Hives, Jaundice, New Lesions, Non-Healing Wounds, Rash and Ulcer. HEENT Not Present- Earache, Hearing Loss, Hoarseness, Nose Bleed, Oral Ulcers, Ringing in the Ears, Seasonal Allergies, Sinus Pain, Sore Throat, Visual Disturbances, Wears glasses/contact lenses and Yellow Eyes. Respiratory  Not Present- Bloody sputum, Chronic Cough,  Difficulty Breathing, Snoring and Wheezing. Breast Not Present- Breast Mass, Breast Pain, Nipple Discharge and Skin Changes. Cardiovascular Not Present- Chest Pain, Difficulty Breathing Lying Down, Leg Cramps, Palpitations, Rapid Heart Rate, Shortness of Breath and Swelling of Extremities. Gastrointestinal Not Present- Abdominal Pain, Bloating, Bloody Stool, Change in Bowel Habits, Chronic diarrhea, Constipation, Difficulty Swallowing, Excessive gas, Gets full quickly at meals, Hemorrhoids, Indigestion, Nausea, Rectal Pain and Vomiting. Male Genitourinary Not Present- Blood in Urine, Change in Urinary Stream, Frequency, Impotence, Nocturia, Painful Urination, Urgency and Urine Leakage. Neurological Not Present- Decreased Memory, Fainting, Headaches, Numbness, Seizures, Tingling, Tremor, Trouble walking and Weakness. Psychiatric Not Present- Anxiety, Bipolar, Change in Sleep Pattern, Depression, Fearful and Frequent crying. Endocrine Not Present- Cold Intolerance, Excessive Hunger, Hair Changes, Heat Intolerance, Hot flashes and New Diabetes. Hematology Not Present- Blood Thinners, Easy Bruising, Excessive bleeding, Gland problems, HIV and Persistent Infections.  Vitals Richard [name]; 02/17/2017 11:43 AM) 02/17/2017 11:42 AM Weight: 255.2 lb Height: 72in Body Surface Area: 2.36 m Body Mass Index: 34.61 kg/m  Temp.: 98.38F  Pulse: 98 (Regular)  BP: 144/84 (Sitting, Left Arm, Standard)       Physical Exam  General Mental Status-Alert. General Appearance-Consistent with stated age. Hydration-Well hydrated. Voice-Normal.  Integumentary Note: Lots of tanning. Actinic keratoses and pigmented nevi everywhere   Head and Neck Head-normocephalic, atraumatic with no lesions or palpable masses. Trachea-midline. Thyroid Gland Characteristics - normal size and consistency. Note: No significant adenopathy in the neck. Neck is thick but soft.   Eye Eyeball -  Bilateral-Extraocular movements intact. Sclera/Conjunctiva - Bilateral-No scleral icterus.  Chest and Lung Exam Chest and lung exam reveals -quiet, even and easy respiratory effort with no use of accessory muscles and on auscultation, normal breath sounds, no adventitious sounds and normal vocal resonance. Inspection Chest Wall - Normal. Back - normal. Note: post op incision healing well.   Cardiovascular Cardiovascular examination reveals -normal heart sounds, regular rate and rhythm with no murmurs and normal pedal pulses bilaterally. Note: Grade 4 systolic aortic murmur from stenosis presumably  Abdomen Inspection Inspection of the abdomen reveals - No Hernias. Skin - Scar - no surgical scars. Palpation/Percussion Palpation and Percussion of the abdomen reveal - Soft, Non Tender, No Rebound tenderness, No Rigidity (guarding) and No hepatosplenomegaly. Auscultation Auscultation of the abdomen reveals - Bowel sounds normal.  Neurologic Neurologic evaluation reveals -alert and oriented x 3 with no impairment of recent or remote memory. Mental Status-Normal.  Musculoskeletal Normal Exam - Left-Upper Extremity Strength Normal and Lower Extremity Strength Normal. Normal Exam - Right-Upper Extremity Strength Normal and Lower Extremity Strength Normal.  Lymphatic Head & Neck  General Head & Neck Lymphatics: Bilateral - Description - Normal. Axillary  General Axillary Region: Bilateral - Description - (Pre-Op) Note: 3.5 cm mobile nontender mass left axilla. Right side seems okay. Tenderness - Non Tender. Femoral & Inguinal  Generalized Femoral & Inguinal Lymphatics: Bilateral - Description - Normal. Tenderness - Non Tender.    Assessment & Plan  AXILLARY ADENOPATHY (R59.0)     excision deep left axillary lymph node reveals diffuse large B-cell Ly,mphoma Dr. Marin Olp plans chemotherapy and requests a port a cath be placed. We have discussed the indications,  techniques, and risks of the surgery in detail  Stop taking the aspirin 2 days preop  Dr. Percival Spanish agrees that  that he agrees that the risk is acceptable, considering your heart disease  HISTORY OF CORONARY ARTERY BYPASS GRAFT (Z95.1) AORTIC STENOSIS, MODERATE (I35.0)  BMI 34.0-34.9,ADULT (Z68.34) HYPERTENSION, BENIGN (I10) CHRONIC GERD (K21.9) SLEEP APNEA IN ADULT (G47.30)

## 2017-03-04 ENCOUNTER — Encounter: Payer: Self-pay | Admitting: *Deleted

## 2017-03-04 ENCOUNTER — Other Ambulatory Visit: Payer: Self-pay | Admitting: *Deleted

## 2017-03-04 ENCOUNTER — Other Ambulatory Visit: Payer: Medicare Other

## 2017-03-04 DIAGNOSIS — C8332 Diffuse large B-cell lymphoma, intrathoracic lymph nodes: Secondary | ICD-10-CM

## 2017-03-04 MED ORDER — LIDOCAINE-PRILOCAINE 2.5-2.5 % EX CREA: TOPICAL_CREAM | CUTANEOUS | 3 refills | Status: DC

## 2017-03-04 MED ORDER — ONDANSETRON HCL 8 MG PO TABS: 8.0000 mg | ORAL_TABLET | Freq: Two times a day (BID) | ORAL | 1 refills | Status: DC | PRN

## 2017-03-04 MED ORDER — PROCHLORPERAZINE MALEATE 10 MG PO TABS: 10.0000 mg | ORAL_TABLET | Freq: Four times a day (QID) | ORAL | 6 refills | Status: DC | PRN

## 2017-03-04 MED ORDER — LORAZEPAM 0.5 MG PO TABS: 0.5000 mg | ORAL_TABLET | Freq: Four times a day (QID) | ORAL | 0 refills | Status: DC | PRN

## 2017-03-05 ENCOUNTER — Telehealth: Payer: Self-pay | Admitting: Family Medicine

## 2017-03-05 ENCOUNTER — Encounter (HOSPITAL_COMMUNITY): Payer: Self-pay | Admitting: *Deleted

## 2017-03-05 MED ORDER — CEFAZOLIN SODIUM-DEXTROSE 2-4 GM/100ML-% IV SOLN
2.0000 g | INTRAVENOUS | Status: AC
  Administered 2017-03-06: 2 g via INTRAVENOUS
  Filled 2017-03-05: qty 100

## 2017-03-05 NOTE — Progress Notes (Signed)
Pt denies SOB and chest pain. Pt under the care of Dr. Percival Spanish, Cardiology. Pt made aware to stop taking vitamins, fish oil and herbal medications. Do not take any NSAIDs ie: Ibuprofen, Advil, Naproxen (Aleve), Motrin, BC and Goody Powder or any medication containing Aspirin. Pt made aware to not take Glimepiride or Metformin DOS. Pt made aware to check BG every 2 hours prior to arrival to hospital on DOS. Pt made aware to treat a BG < 70 with 4 glucose tabs, wait 15 minutes after intervention to recheck BG, if BG remains < 70, call Short Stay unit to speak with a nurse. PT verbalized understanding of all pre-op instructions. Anesthesia asked to review pt history (see note).

## 2017-03-05 NOTE — Progress Notes (Addendum)
Anesthesia Chart Review:  Pt is a same day work up.   Pt is a 76 year old male scheduled for port-a-cath insertion on 03/06/2017 with Fanny Skates, MD  - PCP is Roma Schanz, DO - Cardiologist is Minus Breeding, MD. Last office visit 02/17/17 with Almyra Deforest, PA.  - Oncologist is Burney Gauze, MD  PMH includes:  CAD (s/p CABG 2001), severe aortic stenosis, HTN, mobitz type 1 AV block (also hx intermittent Wenckebach; pacemaker not needed at this time), hyperlipidemia, DM, OSA, anemia, B12 deficiency, GERD. Never smoker. BMI 34.5.   - S/p axillary lymph node excision 02/25/17. S/p cataract extraction 12/04/16 and 11/06/16. S/p lumbar laminectomy 06/17/13.   - By cardiology notes, pt's severe aortic stenosis is asymptomatic.  Continued observation planned while pt undergoes work up by oncology for possible lymphoma.   Medications include: Amlodipine, ASA 81 mg, Lipitor, Nexium, Zetia, fenofibrate, Lasix, glimepiride, potassium, metformin, metoprolol, prednisone, sitagliptin  Labs 01/3017 reviewed.   Glucose 123.  - HbA1c was 8.7 on 02/18/17.   CT chest 02/06/17:  1. Multiple sites of adenopathy as well as multiple subcentimeter lymph nodes throughout the thoracic region. Neoplastic etiology must be of concern with particular concern for lymphoma. 2. Lung edema or consolidation. No pleural effusion. 3. Foci of atherosclerotic calcification in the aorta and great vessels. Native coronary artery calcification noted. Patient is status post coronary artery bypass grafting. 4.There is arthropathy in both sternoclavicular joints with apparent synovial hypertrophy bilaterally in these areas.  EKG 02/25/17: Sinus rhythm with 2nd degree A-V block (Mobitz I) with 2:1 A-V conduction. Possible Inferior infarct, age undetermined - by cardiology notes, pt has known transient 2-1 AV block. However he is asymptomatic. He is on very low-dose of metoprolol which should help with his aortic stenosis by  slowing down the heart rate and improve left ventricular filling. Does not need any pacemaker at this time.  Holter monitor 01/16/17:  - NSR - Baseline artifact - Rare PVCs - Moderate atrial ectopy. - Mobitz Type I AV block noted - 2:1 AV block noted. - No symptoms reported.   Echo 01/16/17:  - Left ventricle: The cavity size was normal. Wall thickness wasincreased in a pattern of mild LVH. Systolic function was normal.The estimated ejection fraction was in the range of 60% to 65%.Wall motion was normal; there were no regional wall motion abnormalities. Doppler parameters are consistent with abnormalleft ventricular relaxation (grade 1 diastolic dysfunction). - Aortic valve: Valve mobility was restricted. There was severestenosis. Peak velocity (S): 435 cm/s. Mean gradient (S): 40 mmHg. Valve area (VTI): 0.78 cm^2. - Mitral valve: Calcified annulus. There was mild regurgitation. - Left atrium: The atrium was mildly dilated. - Right ventricle: The cavity size was moderately dilated. Wallthickness was normal. - Pulmonic valve: Transvalvular velocity was minimally increased.There was no evidence for stenosis. Peak gradient (S): 18 mm Hg. - Impressions: When compared to prior ECHO, aortic stenosis has advanced.  Carotid duplex 05/20/16:  - Heterogeneous plaque, bilaterally. - Stable 1-39% RICA stenosis. - Stable LICA velocities, now in 40-59% range of stenosis. - Normal subclavian arteries, bilaterally. - Patent vertebral arteries with antegrade flow.  Pt tolerated recent lymph node excision.  If no changes, I anticipate pt can proceed with surgery as scheduled.   Willeen Cass, FNP-BC [facility] Short Stay Surgical Center/Anesthesiology Phone: [phone] 03/05/2017 9:49 AM

## 2017-03-05 NOTE — Telephone Encounter (Signed)
Pt called in to make pcp aware that he has been Dx with Diffuse large cell non-Hodgkin's lymphoma and will be starting chemo this Friday 03/07/17.

## 2017-03-06 ENCOUNTER — Encounter: Payer: Self-pay | Admitting: *Deleted

## 2017-03-06 ENCOUNTER — Ambulatory Visit (HOSPITAL_COMMUNITY): Payer: Medicare Other

## 2017-03-06 ENCOUNTER — Encounter (HOSPITAL_COMMUNITY): Payer: Self-pay | Admitting: *Deleted

## 2017-03-06 ENCOUNTER — Ambulatory Visit (HOSPITAL_COMMUNITY)
Admission: RE | Admit: 2017-03-06 | Discharge: 2017-03-06 | Disposition: A | Discharge status: Home / Self Care | Payer: Medicare Other | Source: Ambulatory Visit | Attending: General Surgery | Admitting: General Surgery

## 2017-03-06 ENCOUNTER — Ambulatory Visit (HOSPITAL_COMMUNITY): Payer: Medicare Other | Admitting: Emergency Medicine

## 2017-03-06 ENCOUNTER — Encounter (HOSPITAL_COMMUNITY)
Admission: RE | Disposition: A | Discharge status: Home / Self Care | Payer: Self-pay | Source: Ambulatory Visit | Attending: General Surgery

## 2017-03-06 DIAGNOSIS — I10 Essential (primary) hypertension: Secondary | ICD-10-CM | POA: Insufficient documentation

## 2017-03-06 DIAGNOSIS — Z7982 Long term (current) use of aspirin: Secondary | ICD-10-CM | POA: Diagnosis not present

## 2017-03-06 DIAGNOSIS — E669 Obesity, unspecified: Secondary | ICD-10-CM | POA: Insufficient documentation

## 2017-03-06 DIAGNOSIS — Z951 Presence of aortocoronary bypass graft: Secondary | ICD-10-CM | POA: Diagnosis not present

## 2017-03-06 DIAGNOSIS — E785 Hyperlipidemia, unspecified: Secondary | ICD-10-CM | POA: Insufficient documentation

## 2017-03-06 DIAGNOSIS — I739 Peripheral vascular disease, unspecified: Secondary | ICD-10-CM | POA: Diagnosis not present

## 2017-03-06 DIAGNOSIS — G4733 Obstructive sleep apnea (adult) (pediatric): Secondary | ICD-10-CM | POA: Diagnosis not present

## 2017-03-06 DIAGNOSIS — I2510 Atherosclerotic heart disease of native coronary artery without angina pectoris: Secondary | ICD-10-CM | POA: Diagnosis not present

## 2017-03-06 DIAGNOSIS — K219 Gastro-esophageal reflux disease without esophagitis: Secondary | ICD-10-CM | POA: Insufficient documentation

## 2017-03-06 DIAGNOSIS — Z791 Long term (current) use of non-steroidal anti-inflammatories (NSAID): Secondary | ICD-10-CM | POA: Diagnosis not present

## 2017-03-06 DIAGNOSIS — C8510 Unspecified B-cell lymphoma, unspecified site: Secondary | ICD-10-CM | POA: Insufficient documentation

## 2017-03-06 DIAGNOSIS — C8330 Diffuse large B-cell lymphoma, unspecified site: Secondary | ICD-10-CM | POA: Diagnosis present

## 2017-03-06 DIAGNOSIS — Z7984 Long term (current) use of oral hypoglycemic drugs: Secondary | ICD-10-CM | POA: Insufficient documentation

## 2017-03-06 DIAGNOSIS — J449 Chronic obstructive pulmonary disease, unspecified: Secondary | ICD-10-CM | POA: Insufficient documentation

## 2017-03-06 DIAGNOSIS — R079 Chest pain, unspecified: Secondary | ICD-10-CM | POA: Diagnosis not present

## 2017-03-06 DIAGNOSIS — Z452 Encounter for adjustment and management of vascular access device: Secondary | ICD-10-CM | POA: Diagnosis not present

## 2017-03-06 DIAGNOSIS — Z95828 Presence of other vascular implants and grafts: Secondary | ICD-10-CM

## 2017-03-06 DIAGNOSIS — E119 Type 2 diabetes mellitus without complications: Secondary | ICD-10-CM | POA: Insufficient documentation

## 2017-03-06 DIAGNOSIS — M1990 Unspecified osteoarthritis, unspecified site: Secondary | ICD-10-CM | POA: Diagnosis not present

## 2017-03-06 DIAGNOSIS — C8332 Diffuse large B-cell lymphoma, intrathoracic lymph nodes: Secondary | ICD-10-CM | POA: Diagnosis not present

## 2017-03-06 DIAGNOSIS — Z79899 Other long term (current) drug therapy: Secondary | ICD-10-CM | POA: Insufficient documentation

## 2017-03-06 DIAGNOSIS — S40022A Contusion of left upper arm, initial encounter: Secondary | ICD-10-CM | POA: Diagnosis not present

## 2017-03-06 DIAGNOSIS — Z419 Encounter for procedure for purposes other than remedying health state, unspecified: Secondary | ICD-10-CM

## 2017-03-06 HISTORY — PX: PORTACATH PLACEMENT: SHX2246

## 2017-03-06 LAB — GLUCOSE, CAPILLARY
GLUCOSE-CAPILLARY: 122 mg/dL — AB (ref 65–99)
Glucose-Capillary: 121 mg/dL — ABNORMAL HIGH (ref 65–99)
Glucose-Capillary: 126 mg/dL — ABNORMAL HIGH (ref 65–99)

## 2017-03-06 SURGERY — INSERTION, TUNNELED CENTRAL VENOUS DEVICE, WITH PORT
Anesthesia: General | Site: Chest

## 2017-03-06 MED ORDER — FENTANYL CITRATE (PF) 100 MCG/2ML IJ SOLN
INTRAMUSCULAR | Status: DC | PRN
  Administered 2017-03-06 (×2): 50 ug via INTRAVENOUS

## 2017-03-06 MED ORDER — LIDOCAINE-EPINEPHRINE (PF) 1 %-1:200000 IJ SOLN
INTRAMUSCULAR | Status: DC | PRN
  Administered 2017-03-06: 9 mL

## 2017-03-06 MED ORDER — FENTANYL CITRATE (PF) 250 MCG/5ML IJ SOLN
INTRAMUSCULAR | Status: AC
  Filled 2017-03-06: qty 5

## 2017-03-06 MED ORDER — PHENYLEPHRINE HCL 10 MG/ML IJ SOLN
INTRAVENOUS | Status: DC | PRN
  Administered 2017-03-06: 20 ug/min via INTRAVENOUS

## 2017-03-06 MED ORDER — CHLORHEXIDINE GLUCONATE CLOTH 2 % EX PADS: 6.0000 | MEDICATED_PAD | Freq: Once | CUTANEOUS | Status: DC

## 2017-03-06 MED ORDER — SODIUM CHLORIDE 0.9% FLUSH: 3.0000 mL | Freq: Two times a day (BID) | INTRAVENOUS | Status: DC

## 2017-03-06 MED ORDER — ACETAMINOPHEN 500 MG PO TABS
1000.0000 mg | ORAL_TABLET | ORAL | Status: AC
Start: 2017-03-06 — End: 2017-03-06
  Administered 2017-03-06: 1000 mg via ORAL
  Filled 2017-03-06: qty 2

## 2017-03-06 MED ORDER — ONDANSETRON HCL 4 MG/2ML IJ SOLN
INTRAMUSCULAR | Status: DC | PRN
Start: 2017-03-06 — End: 2017-03-06
  Administered 2017-03-06: 4 mg via INTRAVENOUS

## 2017-03-06 MED ORDER — CELECOXIB 200 MG PO CAPS: 200.0000 mg | ORAL_CAPSULE | ORAL | Status: DC

## 2017-03-06 MED ORDER — IOPAMIDOL (ISOVUE-300) INJECTION 61%
INTRAVENOUS | Status: AC
  Filled 2017-03-06: qty 50

## 2017-03-06 MED ORDER — FENTANYL CITRATE (PF) 100 MCG/2ML IJ SOLN: 25.0000 ug | INTRAMUSCULAR | Status: DC | PRN

## 2017-03-06 MED ORDER — EPHEDRINE SULFATE 50 MG/ML IJ SOLN
INTRAMUSCULAR | Status: DC | PRN
  Administered 2017-03-06: 5 mg via INTRAVENOUS
  Administered 2017-03-06: 10 mg via INTRAVENOUS

## 2017-03-06 MED ORDER — DEXAMETHASONE SODIUM PHOSPHATE 10 MG/ML IJ SOLN
INTRAMUSCULAR | Status: AC
  Filled 2017-03-06: qty 1

## 2017-03-06 MED ORDER — PROPOFOL 10 MG/ML IV BOLUS
INTRAVENOUS | Status: DC | PRN
  Administered 2017-03-06: 100 mg via INTRAVENOUS

## 2017-03-06 MED ORDER — HEPARIN SOD (PORK) LOCK FLUSH 100 UNIT/ML IV SOLN
INTRAVENOUS | Status: AC
  Filled 2017-03-06: qty 5

## 2017-03-06 MED ORDER — OXYCODONE HCL 5 MG PO TABS
5.0000 mg | ORAL_TABLET | ORAL | Status: DC | PRN
  Administered 2017-03-06: 10 mg via ORAL

## 2017-03-06 MED ORDER — LIDOCAINE-EPINEPHRINE (PF) 1 %-1:200000 IJ SOLN
INTRAMUSCULAR | Status: AC
  Filled 2017-03-06: qty 30

## 2017-03-06 MED ORDER — HEPARIN SOD (PORK) LOCK FLUSH 100 UNIT/ML IV SOLN
INTRAVENOUS | Status: DC | PRN
  Administered 2017-03-06: 300 [IU]
  Administered 2017-03-06: 500 [IU]

## 2017-03-06 MED ORDER — SODIUM CHLORIDE 0.9 % IV SOLN
INTRAVENOUS | Status: DC | PRN
  Administered 2017-03-06: 14:00:00

## 2017-03-06 MED ORDER — SODIUM CHLORIDE 0.9% FLUSH: 3.0000 mL | INTRAVENOUS | Status: DC | PRN

## 2017-03-06 MED ORDER — LIDOCAINE 2% (20 MG/ML) 5 ML SYRINGE
INTRAMUSCULAR | Status: AC
  Filled 2017-03-06: qty 5

## 2017-03-06 MED ORDER — LACTATED RINGERS IV SOLN
INTRAVENOUS | Status: DC
  Administered 2017-03-06: 11:00:00 via INTRAVENOUS

## 2017-03-06 MED ORDER — ACETAMINOPHEN 650 MG RE SUPP: 650.0000 mg | RECTAL | Status: DC | PRN

## 2017-03-06 MED ORDER — GABAPENTIN 300 MG PO CAPS
300.0000 mg | ORAL_CAPSULE | ORAL | Status: AC
  Administered 2017-03-06: 300 mg via ORAL
  Filled 2017-03-06: qty 1

## 2017-03-06 MED ORDER — OXYCODONE HCL 5 MG PO TABS
ORAL_TABLET | ORAL | Status: AC
  Filled 2017-03-06: qty 2

## 2017-03-06 MED ORDER — 0.9 % SODIUM CHLORIDE (POUR BTL) OPTIME
TOPICAL | Status: DC | PRN
  Administered 2017-03-06: 1000 mL

## 2017-03-06 MED ORDER — DEXAMETHASONE SODIUM PHOSPHATE 10 MG/ML IJ SOLN
INTRAMUSCULAR | Status: DC | PRN
  Administered 2017-03-06: 10 mg via INTRAVENOUS

## 2017-03-06 MED ORDER — LIDOCAINE HCL (CARDIAC) 20 MG/ML IV SOLN
INTRAVENOUS | Status: DC | PRN
  Administered 2017-03-06: 60 mg via INTRAVENOUS

## 2017-03-06 MED ORDER — PROPOFOL 10 MG/ML IV BOLUS
INTRAVENOUS | Status: AC
  Filled 2017-03-06: qty 20

## 2017-03-06 MED ORDER — ACETAMINOPHEN 325 MG PO TABS: 650.0000 mg | ORAL_TABLET | ORAL | Status: DC | PRN

## 2017-03-06 MED ORDER — ONDANSETRON HCL 4 MG/2ML IJ SOLN
INTRAMUSCULAR | Status: AC
  Filled 2017-03-06: qty 2

## 2017-03-06 MED ORDER — SODIUM CHLORIDE 0.9 % IV SOLN: 250.0000 mL | INTRAVENOUS | Status: DC | PRN

## 2017-03-06 SURGICAL SUPPLY — 50 items
ADH SKN CLS APL DERMABOND .7 (GAUZE/BANDAGES/DRESSINGS) ×1
APL SKNCLS STERI-STRIP NONHPOA (GAUZE/BANDAGES/DRESSINGS) ×1
BAG DECANTER FOR FLEXI CONT (MISCELLANEOUS) ×3 IMPLANT
BENZOIN TINCTURE PRP APPL 2/3 (GAUZE/BANDAGES/DRESSINGS) ×2 IMPLANT
BLADE SURG 11 STRL SS (BLADE) ×3 IMPLANT
BLADE SURG 15 STRL LF DISP TIS (BLADE) ×1 IMPLANT
BLADE SURG 15 STRL SS (BLADE) ×3
CANISTER SUCT 3000ML PPV (MISCELLANEOUS) ×2 IMPLANT
CHLORAPREP W/TINT 26ML (MISCELLANEOUS) ×3 IMPLANT
CLOSURE WOUND 1/2 X4 (GAUZE/BANDAGES/DRESSINGS) ×1
COVER SURGICAL LIGHT HANDLE (MISCELLANEOUS) ×3 IMPLANT
COVER TRANSDUCER ULTRASND GEL (DRAPE) ×2 IMPLANT
CRADLE DONUT ADULT HEAD (MISCELLANEOUS) ×3 IMPLANT
DERMABOND ADVANCED (GAUZE/BANDAGES/DRESSINGS) ×2
DERMABOND ADVANCED .7 DNX12 (GAUZE/BANDAGES/DRESSINGS) ×1 IMPLANT
DRAPE C-ARM 42X72 X-RAY (DRAPES) ×3 IMPLANT
DRAPE LAPAROSCOPIC ABDOMINAL (DRAPES) ×3 IMPLANT
DRAPE UTILITY XL STRL (DRAPES) ×6 IMPLANT
DRSG TEGADERM 4X4.75 (GAUZE/BANDAGES/DRESSINGS) ×2 IMPLANT
ELECT CAUTERY BLADE 6.4 (BLADE) ×3 IMPLANT
ELECT REM PT RETURN 9FT ADLT (ELECTROSURGICAL) ×3
ELECTRODE REM PT RTRN 9FT ADLT (ELECTROSURGICAL) ×1 IMPLANT
GAUZE SPONGE 4X4 12PLY STRL (GAUZE/BANDAGES/DRESSINGS) ×2 IMPLANT
GAUZE SPONGE 4X4 16PLY XRAY LF (GAUZE/BANDAGES/DRESSINGS) ×3 IMPLANT
GLOVE EUDERMIC 7 POWDERFREE (GLOVE) ×3 IMPLANT
GOWN STRL REUS W/ TWL LRG LVL3 (GOWN DISPOSABLE) ×1 IMPLANT
GOWN STRL REUS W/ TWL XL LVL3 (GOWN DISPOSABLE) ×1 IMPLANT
GOWN STRL REUS W/TWL LRG LVL3 (GOWN DISPOSABLE) ×3
GOWN STRL REUS W/TWL XL LVL3 (GOWN DISPOSABLE) ×3
KIT BASIN OR (CUSTOM PROCEDURE TRAY) ×3 IMPLANT
KIT PORT POWER 8FR ISP CVUE (Miscellaneous) ×2 IMPLANT
KIT ROOM TURNOVER OR (KITS) ×3 IMPLANT
NDL HYPO 25GX1X1/2 BEV (NEEDLE) ×2 IMPLANT
NEEDLE HYPO 25GX1X1/2 BEV (NEEDLE) ×6 IMPLANT
NS IRRIG 1000ML POUR BTL (IV SOLUTION) ×3 IMPLANT
PACK SURGICAL SETUP 50X90 (CUSTOM PROCEDURE TRAY) ×3 IMPLANT
PAD ARMBOARD 7.5X6 YLW CONV (MISCELLANEOUS) ×3 IMPLANT
PENCIL BUTTON HOLSTER BLD 10FT (ELECTRODE) ×3 IMPLANT
STRIP CLOSURE SKIN 1/2X4 (GAUZE/BANDAGES/DRESSINGS) ×1 IMPLANT
SUT MNCRL AB 4-0 PS2 18 (SUTURE) ×3 IMPLANT
SUT PROLENE 2 0 CT2 30 (SUTURE) ×3 IMPLANT
SUT VIC AB 3-0 SH 18 (SUTURE) ×3 IMPLANT
SYR 10ML LL (SYRINGE) ×6 IMPLANT
SYR 5ML LUER SLIP (SYRINGE) ×3 IMPLANT
SYR CONTROL 10ML LL (SYRINGE) ×3 IMPLANT
TOWEL OR 17X24 6PK STRL BLUE (TOWEL DISPOSABLE) ×3 IMPLANT
TOWEL OR 17X26 10 PK STRL BLUE (TOWEL DISPOSABLE) ×3 IMPLANT
TUBE CONNECTING 12'X1/4 (SUCTIONS) ×1
TUBE CONNECTING 12X1/4 (SUCTIONS) ×1 IMPLANT
YANKAUER SUCT BULB TIP NO VENT (SUCTIONS) ×2 IMPLANT

## 2017-03-06 NOTE — Transfer of Care (Signed)
Immediate Anesthesia Transfer of Care Note  Patient: Richard [name]  Procedure(s) Performed: Procedure(s): INSERTION PORT-A-CATH AND ASPIRATE SEROMA LEFT AXILLA (N/A)  Patient Location: PACU  Anesthesia Type:General  Level of Consciousness: awake, alert , oriented and patient cooperative  Airway & Oxygen Therapy: Patient Spontanous Breathing  Post-op Assessment: Report given to RN and Post -op Vital signs reviewed and stable  Post vital signs: Reviewed and stable  Last Vitals:  Vitals:   03/06/17 1028 03/06/17 1424  BP: (!) 154/84   Pulse: 91 (!) (P) 48  Resp: 18 (P) 10  Temp: 36.5 C (P) 36.6 C  SpO2: 93% (P) 93%    Last Pain:  Vitals:   03/06/17 1424  TempSrc:   PainSc: (P) 0-No pain      Patients Stated Pain Goal: 5 (35/00/93 8182)  Complications: No apparent anesthesia complications

## 2017-03-06 NOTE — Interval H&P Note (Signed)
History and Physical Interval Note:  03/06/2017 12:15 PM  Richard [name]  has presented today for surgery, with the diagnosis of lymphoma  The various methods of treatment have been discussed with the patient and family. After consideration of risks, benefits and other options for treatment, the patient has consented to  Procedure(s): INSERTION PORT-A-CATH (N/A) as a surgical intervention .  The patient's history has been reviewed, patient examined, no change in status, stable for surgery.  I have reviewed the patient's chart and labs.  Questions were answered to the patient's satisfaction.     [name]

## 2017-03-06 NOTE — Discharge Instructions (Signed)
    PORT-A-CATH: POST OP INSTRUCTIONS  Always review your discharge instruction sheet given to you by the facility where your surgery was performed.   1. A prescription for pain medication may be given to you upon discharge. Take your pain medication as prescribed, if needed. If narcotic pain medicine is not needed, then you make take acetaminophen (Tylenol) or ibuprofen (Advil) as needed.  2. Take your usually prescribed medications unless otherwise directed. 3. If you need a refill on your pain medication, please contact our office. All narcotic pain medicine now requires a paper prescription.  Phoned in and fax refills are no longer allowed by law.  Prescriptions will not be filled after 5 pm or on weekends.  4. You should follow a light diet for the remainder of the day after your procedure. 5. Most patients will experience some mild swelling and/or bruising in the area of the incision. It may take several days to resolve. 6. It is common to experience some constipation if taking pain medication after surgery. Increasing fluid intake and taking a stool softener (such as Colace) will usually help or prevent this problem from occurring. A mild laxative (Milk of Magnesia or Miralax) should be taken according to package directions if there are no bowel movements after 48 hours.  7. Unless discharge instructions indicate otherwise, you may remove your bandages 48 hours after surgery, and you may shower at that time. You may have steri-strips (small white skin tapes) in place directly over the incision.  These strips should be left on the skin for 7-10 days.  If your surgeon used Dermabond (skin glue) on the incision, you may shower in 24 hours.  The glue will flake off over the next 2-3 weeks.  8. If your port is left accessed at the end of surgery (needle left in port), the dressing cannot get wet and should only by changed by a healthcare professional. When the port is no longer accessed (when the  needle has been removed), follow step 7.   9. ACTIVITIES:  Limit activity involving your arms for the next 72 hours. Do no strenuous exercise or activity for 1 week. You may drive when you are no longer taking prescription pain medication, you can comfortably wear a seatbelt, and you can maneuver your car. 10.You may need to see your doctor in the office for a follow-up appointment.  Please       check with your doctor.  11.When you receive a new Port-a-Cath, you will get a product guide and        ID card.  Please keep them in case you need them.  WHEN TO CALL YOUR DOCTOR ([phone]): 1. Fever over 101.0 2. Chills 3. Continued bleeding from incision 4. Increased redness and tenderness at the site 5. Shortness of breath, difficulty breathing   The clinic staff is available to answer your questions during regular business hours. Please don't hesitate to call and ask to speak to one of the nurses or medical assistants for clinical concerns. If you have a medical emergency, go to the nearest emergency room or call 911.  A surgeon from [facility] is always on call at the hospital.     For further information, please visit [email or web address]      

## 2017-03-06 NOTE — Telephone Encounter (Signed)
Left a message to call us if he needs anything-- we will be thinking about him

## 2017-03-06 NOTE — Anesthesia Procedure Notes (Signed)
Procedure Name: LMA Insertion Date/Time: 03/06/2017 1:34 PM Performed by: Lance Coon Pre-anesthesia Checklist: Patient identified, Emergency Drugs available, Suction available, Patient being monitored and Timeout performed Patient Re-evaluated:Patient Re-evaluated prior to induction Oxygen Delivery Method: Circle system utilized Preoxygenation: Pre-oxygenation with 100% oxygen Induction Type: IV induction LMA: LMA inserted LMA Size: 4.0 Number of attempts: 1 Placement Confirmation: positive ETCO2 and breath sounds checked- equal and bilateral Tube secured with: Tape Dental Injury: Teeth and Oropharynx as per pre-operative assessment

## 2017-03-06 NOTE — Anesthesia Preprocedure Evaluation (Addendum)
Anesthesia Evaluation  Patient identified by MRN, date of birth, ID band Patient awake    Reviewed: Allergy & Precautions, H&P , NPO status , Patient's Chart, lab work & pertinent test results, reviewed documented beta blocker date and time   Airway Mallampati: III  TM Distance: >3 FB Neck ROM: Full    Dental no notable dental hx. (+) Partial Upper, Partial Lower, Dental Advisory Given   Pulmonary sleep apnea and Continuous Positive Airway Pressure Ventilation , COPD,  COPD inhaler,    Pulmonary exam normal breath sounds clear to auscultation       Cardiovascular hypertension, Pt. on medications and Pt. on home beta blockers + Peripheral Vascular Disease  + dysrhythmias + Valvular Problems/Murmurs AS  Rhythm:Regular Rate:Normal + Systolic murmurs    Neuro/Psych  Headaches, Anxiety negative psych ROS   GI/Hepatic Neg liver ROS, GERD  Medicated and Controlled,  Endo/Other  diabetes, Type 2, Oral Hypoglycemic AgentsMorbid obesity  Renal/GU negative Renal ROS  negative genitourinary   Musculoskeletal  (+) Arthritis , Osteoarthritis,    Abdominal   Peds  Hematology negative hematology ROS (+) anemia ,   Anesthesia Other Findings   Reproductive/Obstetrics negative OB ROS                           Anesthesia Physical Anesthesia Plan  ASA: III  Anesthesia Plan: General   Post-op Pain Management:    Induction: Intravenous  PONV Risk Score and Plan: 3 and Ondansetron, Dexamethasone and Midazolam  Airway Management Planned: LMA  Additional Equipment:   Intra-op Plan:   Post-operative Plan: Extubation in OR  Informed Consent: I have reviewed the patients History and Physical, chart, labs and discussed the procedure including the risks, benefits and alternatives for the proposed anesthesia with the patient or authorized representative who has indicated his/her understanding and acceptance.    Dental advisory given  Plan Discussed with: CRNA  Anesthesia Plan Comments:         Anesthesia Quick Evaluation

## 2017-03-06 NOTE — Anesthesia Postprocedure Evaluation (Signed)
Anesthesia Post Note  Patient: KIMSEY DEMAREE  Procedure(s) Performed: Procedure(s) (LRB): INSERTION PORT-A-CATH AND ASPIRATE SEROMA LEFT AXILLA (N/A)     Patient location during evaluation: PACU Anesthesia Type: General Level of consciousness: awake and alert Pain management: pain level controlled Vital Signs Assessment: post-procedure vital signs reviewed and stable Respiratory status: spontaneous breathing, nonlabored ventilation and respiratory function stable Cardiovascular status: blood pressure returned to baseline and stable Postop Assessment: no signs of nausea or vomiting Anesthetic complications: no    Last Vitals:  Vitals:   03/06/17 1441 03/06/17 1454  BP: 134/65 137/64  Pulse: 70 (!) 47  Resp: 17 17  Temp:  36.5 C  SpO2: 92% 94%    Last Pain:  Vitals:   03/06/17 1454  TempSrc:   PainSc: 5                  Sharni Negron,W. EDMOND

## 2017-03-06 NOTE — Op Note (Signed)
Patient Name:           Richard [name]   Date of Surgery:        03/06/2017  Pre op Diagnosis:      Large B-cell lymphoma  Post op Diagnosis:   Large B-cell lymphoma, seroma left axilla  Procedure:                 Aspirate seroma left axilla                                      Insertion of power port clearVue 8 French tunneled venous vascular access device                                      Use of fluoroscopy for guidance and positioning  Surgeon:                     Edsel Petrin. Dalbert Batman, M.D., FACS  Assistant:                      OR staff   Indication for Assistant: N/A  Operative Indications:   This is a 76 year old man, referred by Dr. Burney Gauze for evaluation and management of adenopathy and lymphoma.   Dr. Percival Spanish is his cardiologist. Dr. Garnet Koyanagi is his PCP       He has been followed for anemia by Dr. Marin Olp for some time. Has received IV iron and B12. He feels a lump under his left arm which is nontender. He's had some neck pain. MRI and PET scan show axillary and subpectoral lymph nodes bilaterally to be enlarged.  Also some subcarinal and retrocrural lymph nodes light up on PET scan. Heart is enlarged. Tiny retroperitoneal nodes. PET-positive C3 lesion      The only significant peripheral lymph node palpable was in the left axilla Recent excisional biopsy reveals Diffuse Large B-Cell lymphoma.  Dr Marin Olp wishes to initiate chemotherapy immediately. I offered to insert port a cath and this was explained to the patient in detail.  Operative Findings:       The patient was noted to have some swelling in his left axilla preoperatively.  This was not tender or infected.  I chose to needle aspirate this in the OR.  Most of the swelling was edema.  Only got 15 mL of seroma fluid.  The Port-A-Cath was inserted through the right subclavian vein.  At the completion of the case the port was left accessed and had excellent blood return and flushed easily.  The catheter tip  was in the superior vena cava near the right atrial junction.  Procedure in Detail:          Following the induction of general LMA anesthesia a surgical timeout was performed.  Intravenous antibiotics were given.  Following alcohol prep I aspirated the left axilla with an 18-gauge needle and got 15 mL of clear serous fluid.  The tissues were soft still swollen but felt like edema.  A bandage was placed.      The patient was then positioned with a roll behind his shoulders and his arms tucked at his sides.  The neck and chest were prepped and draped in a sterile fashion.  1% Xylocaine with epinephrine was used as a local infiltration  anesthetic.  A right subclavian venipuncture was performed with a single pass and excellent blood return.  A guidewire was inserted into the superior vena cava under fluoroscopic guidance.  A small incision was made at the wire insertion site.  I drew a template on the chest wall to guide positioning and length of the catheter.  A transverse incision was then made below the right clavicle and slightly medial to the venipuncture site.  Subcutaneous pocket was created.  Using a tunneling device I passed the catheter from the wire insertion site to the port pocket site.  Using the template on the chest wall I measured the catheter and cut it 25 cm in length.  The catheter was secured to the port with the locking device and the port and catheter flushed with heparinized saline.  The port was sutured to the pectoralis fascia with 3 interrupted sutures of 2-0 Prolene.  The dilator and peel-away sheath assembly were inserted over the guidewire into the central venous circulation, this was not difficult.  The wire and dilator were removed.  The catheter was threaded and the peel-away sheath removed.  The catheter flushed easily and had excellent blood return.  Fluoroscopy confirmed the tip of the catheter in the superior vena cava near the right atrial junction.  There is no deformity of the  catheter anywhere.  The entire system was flushed with heparinized saline.    The subcutaneous tissue was closed with 3-0 Vicryl sutures.  The skin incisions were closed with subcuticular 4-0 Monocryl and Steri-Strips.  I then brought the angled Huber needle which was swedged onto an IV extension tubing to the field.  This was flushed with heparinized saline.  The needle was inserted through the skin into the port.  I confirmed good blood return and excellent flushing.  The entire assembly was flushed with concentrated heparin.  This was clamped and capped appropriately.  This was secured with Steri-Strips and of a large Tegaderm.  The plan is for him to get chemotherapy tomorrow.  The patient tolerated the procedure well was taken to PACU in stable condition.  EBL 10 mL.  Counts correct.  Complications none.  Chest x-ray is planned in PACU.     Edsel Petrin. Dalbert Batman, M.D., FACS General and Minimally Invasive Surgery Breast and Colorectal Surgery  03/06/2017 2:16 PM

## 2017-03-07 ENCOUNTER — Ambulatory Visit: Payer: Medicare Other

## 2017-03-07 ENCOUNTER — Ambulatory Visit (HOSPITAL_BASED_OUTPATIENT_CLINIC_OR_DEPARTMENT_OTHER): Payer: Medicare Other

## 2017-03-07 ENCOUNTER — Encounter (HOSPITAL_COMMUNITY): Payer: Self-pay | Admitting: General Surgery

## 2017-03-07 ENCOUNTER — Other Ambulatory Visit (HOSPITAL_BASED_OUTPATIENT_CLINIC_OR_DEPARTMENT_OTHER): Payer: Medicare Other

## 2017-03-07 VITALS — BP 119/61 | HR 64 | Temp 98.0°F | Resp 20

## 2017-03-07 DIAGNOSIS — Z5112 Encounter for antineoplastic immunotherapy: Secondary | ICD-10-CM | POA: Diagnosis not present

## 2017-03-07 DIAGNOSIS — Z5111 Encounter for antineoplastic chemotherapy: Secondary | ICD-10-CM | POA: Diagnosis not present

## 2017-03-07 DIAGNOSIS — C8332 Diffuse large B-cell lymphoma, intrathoracic lymph nodes: Secondary | ICD-10-CM

## 2017-03-07 DIAGNOSIS — C8338 Diffuse large B-cell lymphoma, lymph nodes of multiple sites: Secondary | ICD-10-CM

## 2017-03-07 LAB — CBC WITH DIFFERENTIAL (CANCER CENTER ONLY)
BASO#: 0 10*3/uL (ref 0.0–0.2)
BASO%: 0.1 % (ref 0.0–2.0)
EOS%: 0 % (ref 0.0–7.0)
Eosinophils Absolute: 0 10*3/uL (ref 0.0–0.5)
HEMATOCRIT: 39.3 % (ref 38.7–49.9)
HGB: 13.2 g/dL (ref 13.0–17.1)
LYMPH#: 1 10*3/uL (ref 0.9–3.3)
LYMPH%: 9.4 % — AB (ref 14.0–48.0)
MCH: 28.7 pg (ref 28.0–33.4)
MCHC: 33.6 g/dL (ref 32.0–35.9)
MCV: 85 fL (ref 82–98)
MONO#: 0.7 10*3/uL (ref 0.1–0.9)
MONO%: 6.9 % (ref 0.0–13.0)
NEUT#: 8.7 10*3/uL — ABNORMAL HIGH (ref 1.5–6.5)
NEUT%: 83.6 % — AB (ref 40.0–80.0)
PLATELETS: 247 10*3/uL (ref 145–400)
RBC: 4.6 10*6/uL (ref 4.20–5.70)
RDW: 16.1 % — ABNORMAL HIGH (ref 11.1–15.7)
WBC: 10.4 10*3/uL — ABNORMAL HIGH (ref 4.0–10.0)

## 2017-03-07 LAB — CMP (CANCER CENTER ONLY)
ALK PHOS: 119 U/L — AB (ref 26–84)
ALT(SGPT): 30 U/L (ref 10–47)
AST: 23 U/L (ref 11–38)
Albumin: 3.7 g/dL (ref 3.3–5.5)
BUN: 20 mg/dL (ref 7–22)
CALCIUM: 9.8 mg/dL (ref 8.0–10.3)
CHLORIDE: 102 meq/L (ref 98–108)
CO2: 28 meq/L (ref 18–33)
Creat: 0.9 mg/dl (ref 0.6–1.2)
Glucose, Bld: 174 mg/dL — ABNORMAL HIGH (ref 73–118)
POTASSIUM: 4.1 meq/L (ref 3.3–4.7)
Sodium: 142 mEq/L (ref 128–145)
Total Bilirubin: 0.8 mg/dl (ref 0.20–1.60)
Total Protein: 7 g/dL (ref 6.4–8.1)

## 2017-03-07 MED ORDER — DEXAMETHASONE SODIUM PHOSPHATE 10 MG/ML IJ SOLN
INTRAMUSCULAR | Status: AC
  Filled 2017-03-07: qty 1

## 2017-03-07 MED ORDER — ACETAMINOPHEN 325 MG PO TABS
ORAL_TABLET | ORAL | Status: AC
  Filled 2017-03-07: qty 2

## 2017-03-07 MED ORDER — SODIUM CHLORIDE 0.9 % IV SOLN
375.0000 mg/m2 | Freq: Once | INTRAVENOUS | Status: AC
  Administered 2017-03-07: 900 mg via INTRAVENOUS
  Filled 2017-03-07: qty 40

## 2017-03-07 MED ORDER — DEXAMETHASONE SODIUM PHOSPHATE 10 MG/ML IJ SOLN
10.0000 mg | Freq: Once | INTRAMUSCULAR | Status: AC
  Administered 2017-03-07: 10 mg via INTRAVENOUS

## 2017-03-07 MED ORDER — PALONOSETRON HCL INJECTION 0.25 MG/5ML
0.2500 mg | Freq: Once | INTRAVENOUS | Status: AC
  Administered 2017-03-07: 0.25 mg via INTRAVENOUS

## 2017-03-07 MED ORDER — VINCRISTINE SULFATE CHEMO INJECTION 1 MG/ML
2.0000 mg | Freq: Once | INTRAVENOUS | Status: AC
  Administered 2017-03-07: 2 mg via INTRAVENOUS
  Filled 2017-03-07: qty 2

## 2017-03-07 MED ORDER — ACETAMINOPHEN 325 MG PO TABS
650.0000 mg | ORAL_TABLET | Freq: Once | ORAL | Status: AC
  Administered 2017-03-07: 650 mg via ORAL

## 2017-03-07 MED ORDER — SODIUM CHLORIDE 0.9 % IV SOLN
750.0000 mg/m2 | Freq: Once | INTRAVENOUS | Status: AC
  Administered 2017-03-07: 1820 mg via INTRAVENOUS
  Filled 2017-03-07: qty 91

## 2017-03-07 MED ORDER — DIPHENHYDRAMINE HCL 25 MG PO CAPS
50.0000 mg | ORAL_CAPSULE | Freq: Once | ORAL | Status: AC
  Administered 2017-03-07: 50 mg via ORAL

## 2017-03-07 MED ORDER — SODIUM CHLORIDE 0.9% FLUSH
10.0000 mL | INTRAVENOUS | Status: DC | PRN
  Administered 2017-03-07: 10 mL
  Filled 2017-03-07: qty 10

## 2017-03-07 MED ORDER — PEGFILGRASTIM 6 MG/0.6ML ~~LOC~~ PSKT
6.0000 mg | PREFILLED_SYRINGE | Freq: Once | SUBCUTANEOUS | Status: AC
  Administered 2017-03-07: 6 mg via SUBCUTANEOUS

## 2017-03-07 MED ORDER — HEPARIN SOD (PORK) LOCK FLUSH 100 UNIT/ML IV SOLN
500.0000 [IU] | Freq: Once | INTRAVENOUS | Status: AC | PRN
  Administered 2017-03-07: 500 [IU]
  Filled 2017-03-07: qty 5

## 2017-03-07 MED ORDER — SODIUM CHLORIDE 0.9 % IV SOLN
Freq: Once | INTRAVENOUS | Status: AC
  Administered 2017-03-07: 09:00:00 via INTRAVENOUS

## 2017-03-07 MED ORDER — PALONOSETRON HCL INJECTION 0.25 MG/5ML
INTRAVENOUS | Status: AC
  Filled 2017-03-07: qty 5

## 2017-03-07 MED ORDER — DIPHENHYDRAMINE HCL 25 MG PO CAPS
ORAL_CAPSULE | ORAL | Status: AC
  Filled 2017-03-07: qty 2

## 2017-03-07 MED ORDER — DOXORUBICIN HCL CHEMO IV INJECTION 2 MG/ML
50.0000 mg/m2 | Freq: Once | INTRAVENOUS | Status: AC
  Administered 2017-03-07: 122 mg via INTRAVENOUS
  Filled 2017-03-07: qty 61

## 2017-03-07 MED ORDER — PEGFILGRASTIM 6 MG/0.6ML ~~LOC~~ PSKT
PREFILLED_SYRINGE | SUBCUTANEOUS | Status: AC
  Filled 2017-03-07: qty 0.6

## 2017-03-07 NOTE — Patient Instructions (Addendum)
[facility] Discharge Instructions for Patients Receiving Chemotherapy  Today you received the following chemotherapy agents: Adriamycin, Vincristine, Cytoxan and Rituxan.   To help prevent nausea and vomiting after your treatment, we encourage you to take your nausea medication as ordered per MD.    If you develop nausea and vomiting that is not controlled by your nausea medication, call the clinic.   BELOW ARE SYMPTOMS THAT SHOULD BE REPORTED IMMEDIATELY:  *FEVER GREATER THAN 100.5 F  *CHILLS WITH OR WITHOUT FEVER  NAUSEA AND VOMITING THAT IS NOT CONTROLLED WITH YOUR NAUSEA MEDICATION  *UNUSUAL SHORTNESS OF BREATH  *UNUSUAL BRUISING OR BLEEDING  TENDERNESS IN MOUTH AND THROAT WITH OR WITHOUT PRESENCE OF ULCERS  *URINARY PROBLEMS  *BOWEL PROBLEMS  UNUSUAL RASH Items with * indicate a potential emergency and should be followed up as soon as possible.  Feel free to call the clinic you have any questions or concerns. The clinic phone number is [phone].  Please show the [facility] at check-in to the Emergency Department and triage nurse.  Pegfilgrastim injection What is this medicine? PEGFILGRASTIM (PEG fil gra stim) is a long-acting granulocyte colony-stimulating factor that stimulates the growth of neutrophils, a type of white blood cell important in the body's fight against infection. It is used to reduce the incidence of fever and infection in patients with certain types of cancer who are receiving chemotherapy that affects the bone marrow, and to increase survival after being exposed to high doses of radiation. This medicine may be used for other purposes; ask your health care provider or pharmacist if you have questions. COMMON BRAND NAME(S): Neulasta What should I tell my health care provider before I take this medicine? They need to know if you have any of these conditions: -kidney disease -latex allergy -ongoing radiation therapy -sickle  cell disease -skin reactions to acrylic adhesives (On-Body Injector only) -an unusual or allergic reaction to pegfilgrastim, filgrastim, other medicines, foods, dyes, or preservatives -pregnant or trying to get pregnant -breast-feeding How should I use this medicine? This medicine is for injection under the skin. If you get this medicine at home, you will be taught how to prepare and give the pre-filled syringe or how to use the On-body Injector. Refer to the patient Instructions for Use for detailed instructions. Use exactly as directed. Tell your healthcare provider immediately if you suspect that the On-body Injector may not have performed as intended or if you suspect the use of the On-body Injector resulted in a missed or partial dose. It is important that you put your used needles and syringes in a special sharps container. Do not put them in a trash can. If you do not have a sharps container, call your pharmacist or healthcare provider to get one. Talk to your pediatrician regarding the use of this medicine in children. While this drug may be prescribed for selected conditions, precautions do apply. Overdosage: If you think you have taken too much of this medicine contact a poison control center or emergency room at once. NOTE: This medicine is only for you. Do not share this medicine with others. What if I miss a dose? It is important not to miss your dose. Call your doctor or health care professional if you miss your dose. If you miss a dose due to an On-body Injector failure or leakage, a new dose should be administered as soon as possible using a single prefilled syringe for manual use. What may interact with this medicine? Interactions  have not been studied. Give your health care provider a list of all the medicines, herbs, non-prescription drugs, or dietary supplements you use. Also tell them if you smoke, drink alcohol, or use illegal drugs. Some items may interact with your  medicine. This list may not describe all possible interactions. Give your health care provider a list of all the medicines, herbs, non-prescription drugs, or dietary supplements you use. Also tell them if you smoke, drink alcohol, or use illegal drugs. Some items may interact with your medicine. What should I watch for while using this medicine? You may need blood work done while you are taking this medicine. If you are going to need a MRI, CT scan, or other procedure, tell your doctor that you are using this medicine (On-Body Injector only). What side effects may I notice from receiving this medicine? Side effects that you should report to your doctor or health care professional as soon as possible: -allergic reactions like skin rash, itching or hives, swelling of the face, lips, or tongue -dizziness -fever -pain, redness, or irritation at site where injected -pinpoint red spots on the skin -red or dark-brown urine -shortness of breath or breathing problems -stomach or side pain, or pain at the shoulder -swelling -tiredness -trouble passing urine or change in the amount of urine Side effects that usually do not require medical attention (report to your doctor or health care professional if they continue or are bothersome): -bone pain -muscle pain This list may not describe all possible side effects. Call your doctor for medical advice about side effects. You may report side effects to FDA at 1-800-FDA-1088. Where should I keep my medicine? Keep out of the reach of children. Store pre-filled syringes in a refrigerator between 2 and 8 degrees C (36 and 46 degrees F). Do not freeze. Keep in carton to protect from light. Throw away this medicine if it is left out of the refrigerator for more than 48 hours. Throw away any unused medicine after the expiration date. NOTE: This sheet is a summary. It may not cover all possible information. If you have questions about this medicine, talk to your  doctor, pharmacist, or health care provider.  2018 Elsevier/Gold Standard (2016-06-13 12:58:03)

## 2017-03-07 NOTE — Progress Notes (Signed)
OK to treat today with labs from 02/27/17 per Dr. Marin Olp.  Positive blood return prior to, during and after Adriamycin and Vincristine.

## 2017-03-07 NOTE — Patient Instructions (Signed)
Implanted Port Home Guide An implanted port is a type of central line that is placed under the skin. Central lines are used to provide IV access when treatment or nutrition needs to be given through a person's veins. Implanted ports are used for long-term IV access. An implanted port may be placed because:  You need IV medicine that would be irritating to the small veins in your hands or arms.  You need long-term IV medicines, such as antibiotics.  You need IV nutrition for a long period.  You need frequent blood draws for lab tests.  You need dialysis.  Implanted ports are usually placed in the chest area, but they can also be placed in the upper arm, the abdomen, or the leg. An implanted port has two main parts:  Reservoir. The reservoir is round and will appear as a small, raised area under your skin. The reservoir is the part where a needle is inserted to give medicines or draw blood.  Catheter. The catheter is a thin, flexible tube that extends from the reservoir. The catheter is placed into a large vein. Medicine that is inserted into the reservoir goes into the catheter and then into the vein.  How will I care for my incision site? Do not get the incision site wet. Bathe or shower as directed by your health care provider. How is my port accessed? Special steps must be taken to access the port:  Before the port is accessed, a numbing cream can be placed on the skin. This helps numb the skin over the port site.  Your health care provider uses a sterile technique to access the port. ? Your health care provider must put on a mask and sterile gloves. ? The skin over your port is cleaned carefully with an antiseptic and allowed to dry. ? The port is gently pinched between sterile gloves, and a needle is inserted into the port.  Only "non-coring" port needles should be used to access the port. Once the port is accessed, a blood return should be checked. This helps ensure that the port  is in the vein and is not clogged.  If your port needs to remain accessed for a constant infusion, a clear (transparent) bandage will be placed over the needle site. The bandage and needle will need to be changed every week, or as directed by your health care provider.  Keep the bandage covering the needle clean and dry. Do not get it wet. Follow your health care provider's instructions on how to take a shower or bath while the port is accessed.  If your port does not need to stay accessed, no bandage is needed over the port.  What is flushing? Flushing helps keep the port from getting clogged. Follow your health care provider's instructions on how and when to flush the port. Ports are usually flushed with saline solution or a medicine called heparin. The need for flushing will depend on how the port is used.  If the port is used for intermittent medicines or blood draws, the port will need to be flushed: ? After medicines have been given. ? After blood has been drawn. ? As part of routine maintenance.  If a constant infusion is running, the port may not need to be flushed.  How long will my port stay implanted? The port can stay in for as long as your health care provider thinks it is needed. When it is time for the port to come out, surgery will be   done to remove it. The procedure is similar to the one performed when the port was put in. When should I seek immediate medical care? When you have an implanted port, you should seek immediate medical care if:  You notice a bad smell coming from the incision site.  You have swelling, redness, or drainage at the incision site.  You have more swelling or pain at the port site or the surrounding area.  You have a fever that is not controlled with medicine.  This information is not intended to replace advice given to you by your health care provider. Make sure you discuss any questions you have with your health care provider. Document  Released: 06/17/2005 Document Revised: 11/23/2015 Document Reviewed: 02/22/2013 Elsevier Interactive Patient Education  2017 [employer]vier [employer].  

## 2017-03-10 ENCOUNTER — Telehealth: Payer: Self-pay

## 2017-03-10 ENCOUNTER — Other Ambulatory Visit: Payer: Self-pay

## 2017-03-10 DIAGNOSIS — C8332 Diffuse large B-cell lymphoma, intrathoracic lymph nodes: Secondary | ICD-10-CM

## 2017-03-10 MED ORDER — HYDROCODONE-ACETAMINOPHEN 5-325 MG PO TABS: 1.0000 | ORAL_TABLET | Freq: Four times a day (QID) | ORAL | 0 refills | Status: DC | PRN

## 2017-03-10 NOTE — Telephone Encounter (Signed)
Contacted pt re: 1st [facility]. Pt reports no change in appetite or energy level and denies nausea/vomiting. Pt was advised to try Miralax on Friday d/t to complaints of constipation. Pt reports having a "wonderful bowel movement" and then a return to normal bowel function. Aware of how to reach office and/or on call MD should concerns arise. dph

## 2017-03-10 NOTE — Telephone Encounter (Signed)
Noted  

## 2017-03-11 ENCOUNTER — Other Ambulatory Visit: Payer: Self-pay | Admitting: Student

## 2017-03-12 ENCOUNTER — Ambulatory Visit (HOSPITAL_COMMUNITY)
Admission: RE | Admit: 2017-03-12 | Discharge: 2017-03-12 | Disposition: A | Discharge status: Home / Self Care | Payer: Medicare Other | Source: Ambulatory Visit | Attending: Hematology & Oncology | Admitting: Hematology & Oncology

## 2017-03-12 ENCOUNTER — Encounter (HOSPITAL_COMMUNITY): Payer: Self-pay

## 2017-03-12 DIAGNOSIS — Z23 Encounter for immunization: Secondary | ICD-10-CM | POA: Diagnosis not present

## 2017-03-12 DIAGNOSIS — Z7984 Long term (current) use of oral hypoglycemic drugs: Secondary | ICD-10-CM | POA: Insufficient documentation

## 2017-03-12 DIAGNOSIS — F419 Anxiety disorder, unspecified: Secondary | ICD-10-CM | POA: Diagnosis not present

## 2017-03-12 DIAGNOSIS — C8590 Non-Hodgkin lymphoma, unspecified, unspecified site: Secondary | ICD-10-CM | POA: Diagnosis not present

## 2017-03-12 DIAGNOSIS — D72829 Elevated white blood cell count, unspecified: Secondary | ICD-10-CM | POA: Diagnosis not present

## 2017-03-12 DIAGNOSIS — E782 Mixed hyperlipidemia: Secondary | ICD-10-CM | POA: Diagnosis not present

## 2017-03-12 DIAGNOSIS — Z79899 Other long term (current) drug therapy: Secondary | ICD-10-CM | POA: Diagnosis not present

## 2017-03-12 DIAGNOSIS — D508 Other iron deficiency anemias: Secondary | ICD-10-CM | POA: Insufficient documentation

## 2017-03-12 DIAGNOSIS — D649 Anemia, unspecified: Secondary | ICD-10-CM | POA: Diagnosis not present

## 2017-03-12 DIAGNOSIS — I10 Essential (primary) hypertension: Secondary | ICD-10-CM | POA: Insufficient documentation

## 2017-03-12 DIAGNOSIS — Z85828 Personal history of other malignant neoplasm of skin: Secondary | ICD-10-CM | POA: Diagnosis not present

## 2017-03-12 DIAGNOSIS — M109 Gout, unspecified: Secondary | ICD-10-CM | POA: Diagnosis not present

## 2017-03-12 DIAGNOSIS — Z951 Presence of aortocoronary bypass graft: Secondary | ICD-10-CM | POA: Insufficient documentation

## 2017-03-12 DIAGNOSIS — E119 Type 2 diabetes mellitus without complications: Secondary | ICD-10-CM | POA: Insufficient documentation

## 2017-03-12 DIAGNOSIS — R590 Localized enlarged lymph nodes: Secondary | ICD-10-CM | POA: Insufficient documentation

## 2017-03-12 DIAGNOSIS — Z7982 Long term (current) use of aspirin: Secondary | ICD-10-CM | POA: Insufficient documentation

## 2017-03-12 DIAGNOSIS — C8332 Diffuse large B-cell lymphoma, intrathoracic lymph nodes: Secondary | ICD-10-CM | POA: Diagnosis not present

## 2017-03-12 DIAGNOSIS — G4733 Obstructive sleep apnea (adult) (pediatric): Secondary | ICD-10-CM | POA: Diagnosis not present

## 2017-03-12 DIAGNOSIS — K219 Gastro-esophageal reflux disease without esophagitis: Secondary | ICD-10-CM | POA: Diagnosis not present

## 2017-03-12 DIAGNOSIS — I2510 Atherosclerotic heart disease of native coronary artery without angina pectoris: Secondary | ICD-10-CM | POA: Insufficient documentation

## 2017-03-12 DIAGNOSIS — D519 Vitamin B12 deficiency anemia, unspecified: Secondary | ICD-10-CM | POA: Diagnosis not present

## 2017-03-12 DIAGNOSIS — D7589 Other specified diseases of blood and blood-forming organs: Secondary | ICD-10-CM | POA: Diagnosis not present

## 2017-03-12 LAB — PROTIME-INR
INR: 1
Prothrombin Time: 13.2 seconds (ref 11.4–15.2)

## 2017-03-12 LAB — APTT: aPTT: 31 seconds (ref 24–36)

## 2017-03-12 LAB — CBC
HCT: 37.4 % — ABNORMAL LOW (ref 39.0–52.0)
HEMOGLOBIN: 12.4 g/dL — AB (ref 13.0–17.0)
MCH: 28.1 pg (ref 26.0–34.0)
MCHC: 33.2 g/dL (ref 30.0–36.0)
MCV: 84.8 fL (ref 78.0–100.0)
Platelets: 155 10*3/uL (ref 150–400)
RBC: 4.41 MIL/uL (ref 4.22–5.81)
RDW: 16.5 % — ABNORMAL HIGH (ref 11.5–15.5)
WBC: 15.9 10*3/uL — AB (ref 4.0–10.5)

## 2017-03-12 LAB — GLUCOSE, CAPILLARY: Glucose-Capillary: 119 mg/dL — ABNORMAL HIGH (ref 65–99)

## 2017-03-12 MED ORDER — SODIUM CHLORIDE 0.9 % IV SOLN
INTRAVENOUS | Status: DC
  Administered 2017-03-12: 08:00:00 via INTRAVENOUS

## 2017-03-12 MED ORDER — LIDOCAINE HCL (PF) 1 % IJ SOLN
INTRAMUSCULAR | Status: AC | PRN
  Administered 2017-03-12: 10 mL via INTRADERMAL

## 2017-03-12 MED ORDER — MIDAZOLAM HCL 2 MG/2ML IJ SOLN
INTRAMUSCULAR | Status: AC
  Filled 2017-03-12: qty 4

## 2017-03-12 MED ORDER — FENTANYL CITRATE (PF) 100 MCG/2ML IJ SOLN
INTRAMUSCULAR | Status: AC | PRN
  Administered 2017-03-12 (×2): 50 ug via INTRAVENOUS

## 2017-03-12 MED ORDER — MIDAZOLAM HCL 2 MG/2ML IJ SOLN
INTRAMUSCULAR | Status: AC | PRN
  Administered 2017-03-12 (×2): 1 mg via INTRAVENOUS

## 2017-03-12 MED ORDER — FENTANYL CITRATE (PF) 100 MCG/2ML IJ SOLN
INTRAMUSCULAR | Status: AC
  Filled 2017-03-12: qty 4

## 2017-03-12 NOTE — Consult Note (Signed)
Chief Complaint: Patient was seen in consultation today for CT guided bone marrow biopsy  Referring Physician(s): Ennever,Peter R  Supervising Physician: Daryll Brod  Patient Status: [facility] - Out-pt  History of Present Illness: Richard [name] is a 76 y.o. male with history of recently diagnosed diffuse  large B cell lymphoma (NHL) who presents today for CT-guided bone marrow biopsy for staging purposes.  Past Medical History:  Diagnosis Date  . Adenomatous colon polyp 05/01/2015   tubular  . Anemia   . Antritis (stomach)    mild  . Anxiety   . Aortic stenosis   . Arthritis    "knees, hips, shoulders" (03/15/2014)  . Axillary adenopathy 02/25/2017  . CAD (coronary artery disease)    bypass 2001  . Chronic lower back pain   . Diverticulosis    mild, left colon  . Duodenitis    chronic  . Esophageal stricture    X 4  . GERD (gastroesophageal reflux disease)   . Gout   . Heart murmur    Echo in july 2018  . History of gout   . HNP (herniated nucleus pulposus), lumbar   . HTN (hypertension)   . Iron deficiency   . Lymphoma ([place])   . Migraines    "til age 71 yrs"  . Mixed hyperlipidemia   . OSA on CPAP    with 2L O2 at night  . Peptic stricture of esophagus   . Skin cancer    "right neck cut off; several frozen off my arms" (03/15/2014)  . Sliding hiatal hernia   . Spinal stenosis   . Type II diabetes mellitus ([place])   . Vitamin B12 deficiency   . Wenckebach block    a. brady down to 30s due to frequent block, metoprolol held in response.    Past Surgical History:  Procedure Laterality Date  . APPENDECTOMY  ~ 1952  . BACK SURGERY    . CATARACT EXTRACTION W/PHACO Left 11/06/2016   Procedure: CATARACT EXTRACTION PHACO AND INTRAOCULAR LENS PLACEMENT (IOC);  Surgeon: Estill Cotta, MD;  Location: [facility] ORS;  Service: Ophthalmology;  Laterality: Left;  Lot # [id number] H [place]: 01:09.4 AP%:25.2 CDE: 30.64  . CATARACT EXTRACTION W/PHACO Right 12/04/2016   Procedure:  CATARACT EXTRACTION PHACO AND INTRAOCULAR LENS PLACEMENT (IOC);  Surgeon: Estill Cotta, MD;  Location: [facility] ORS;  Service: Ophthalmology;  Laterality: Right;  [place] 1:25.9 AP% 24.1 CDE 39.10 Fluid Pack lot # [id number]  . COLONOSCOPY W/ BIOPSIES AND POLYPECTOMY  2013  . CORONARY ANGIOPLASTY  1993  . CORONARY ANGIOPLASTY WITH STENT PLACEMENT  05/1997   "1"  . CORONARY ARTERY BYPASS GRAFT  03/2000   "CABG X5"  . ESOPHAGEAL DILATION  X 3-4   Dr. Lyla Son; "last one was in the 1990's"  . ESOPHAGOGASTRODUODENOSCOPY     multiple  . FLEXIBLE SIGMOIDOSCOPY     multiple  . HYDRADENITIS EXCISION Left 02/25/2017   Procedure: EXCISION DEEP LEFT AXILLARY LYMPH NODE;  Surgeon: Fanny Skates, MD;  Location: [place];  Service: General;  Laterality: Left;  . KNEE ARTHROSCOPY Left 2011   meniscus repair  . LEFT HEART CATHETERIZATION WITH CORONARY/GRAFT ANGIOGRAM N/A 03/16/2014   Procedure: LEFT HEART CATHETERIZATION WITH Beatrix Fetters;  Surgeon: Burnell Blanks, MD;  Location: [facility] CATH LAB;  Service: Cardiovascular;  Laterality: N/A;  . LUMBAR LAMINECTOMY/DECOMPRESSION MICRODISCECTOMY Right 06/17/2013   Procedure: LUMBAR LAMINECTOMY MICRODISCECTOMY L4-L5 RIGHT EXCISION OF SYNOVIAL CYST RIGHT   (1 LEVEL) RIGHT PARTIAL FACETECTOMY;  Surgeon:  Tobi Bastos, MD;  Location: [facility] ORS;  Service: Orthopedics;  Laterality: Right;  . MYELOGRAM  04/06/13   lumbar, Dr Gladstone Lighter  . PANENDOSCOPY    . PORTACATH PLACEMENT N/A 03/06/2017   Procedure: INSERTION PORT-A-CATH AND ASPIRATE SEROMA LEFT AXILLA;  Surgeon: Fanny Skates, MD;  Location: [place];  Service: General;  Laterality: N/A;  . SHOULDER SURGERY Right 08/2010   screws placed; "tendons tore off"  . SKIN CANCER EXCISION Right    "neck"  . TONSILLECTOMY  ~ 1954  . UPPER GI ENDOSCOPY  2013   Gastritis; Dr Carlean Purl  . VASECTOMY      Allergies: Benazepril; Hctz [hydrochlorothiazide]; Aspirin; and Lactose intolerance  (gi)  Medications: Prior to Admission medications   Medication Sig Start Date End Date Taking? Authorizing Provider  allopurinol (ZYLOPRIM) 300 MG tablet Take 450 mg by mouth daily.    Yes [provider]  amLODipine (NORVASC) 5 MG tablet TAKE 2 TABLETS (10 MG TOTAL) BY MOUTH DAILY. Patient taking differently: TAKE 1 TABLETS BY MOUTH TWICE DAILY. 01/15/17  Yes Minus Breeding, MD  atorvastatin (LIPITOR) 80 MG tablet TAKE ONE TABLET BY MOUTH AT BEDTIME 01/29/17  Yes Minus Breeding, MD  esomeprazole (NEXIUM) 40 MG capsule Take 1 capsule (40 mg total) by mouth daily. 10/15/12  Yes [name], MD  ezetimibe (ZETIA) 10 MG tablet TAKE ONE TABLET BY MOUTH DAILY 01/15/17  Yes Minus Breeding, MD  famciclovir (FAMVIR) 500 MG tablet Take 1 tablet (500 mg total) by mouth daily. 02/27/17  Yes [name], MD  fenofibrate 160 MG tablet TAKE 1 TABLET BY MOUTH EVERY DAY 05/13/16  Yes Lowne Chase, Yvonne R, DO  fluticasone (FLONASE) 50 MCG/ACT nasal spray INHALE ONE SPRAY INTO EACH NOSTRIL TWICE DAILY. Patient taking differently: INHALE ONE SPRAY INTO EACH NOSTRIL TWICE DAILY AS NEEDED FOR ALLERGIES 04/10/16  Yes Lowne Lyndal Pulley R, DO  furosemide (LASIX) 40 MG tablet TAKE 2 TABLETS (80 MG TOTAL) BY MOUTH 2 (TWO) TIMES DAILY. 01/10/17  Yes Hochrein, Jeneen Rinks, MD  glimepiride (AMARYL) 2 MG tablet TAKE 1 TABLET (2 MG TOTAL) BY MOUTH DAILY WITH BREAKFAST. 02/14/17  Yes Roma Schanz R, DO  glucose 5 g chewable tablet Chew 15 g by mouth as needed for low blood sugar.   Yes [provider]  HYDROcodone-acetaminophen (NORCO) 5-325 MG tablet Take 1-2 tablets by mouth every 6 (six) hours as needed for moderate pain or severe pain. 03/10/17  Yes Ennever, Rudell Cobb, MD  KLOR-CON M20 20 MEQ tablet TAKE ONE TABLET BY MOUTH TWICE DAILY 01/15/17  Yes Minus Breeding, MD  Menthol, Topical Analgesic, (ICY HOT EX) Apply 1 application topically daily as needed (muscle pain).   Yes [provider]   metFORMIN (GLUCOPHAGE) 1000 MG tablet TAKE ONE AND ONE-HALF TABLETS BY MOUTH EVERY MORNING AND 1 TALBET IN THE EVENING. 02/24/17  Yes Roma Schanz R, DO  metoprolol tartrate (LOPRESSOR) 25 MG tablet TAKE ONE-HALF TABLET BY MOUTH TWICE DAILY Patient taking differently: TAKE ONE-HALF TABLET BY MOUTH IN THE MORNING AND HALF TABLET IN THE EVENING 01/15/17  Yes Hochrein, Jeneen Rinks, MD  montelukast (SINGULAIR) 10 MG tablet Take 1 tablet (10 mg total) by mouth at bedtime. 07/07/15  Yes Saguier, Percell Miller, PA-C  polycarbophil (FIBERCON) 625 MG tablet Take 625 mg by mouth daily.   Yes [provider]  predniSONE (DELTASONE) 20 MG tablet Take 3 pills a day, with food, for 5 days. Start the day after each chemotherapy 02/27/17  Yes  [name], MD  pregabalin (LYRICA) 75 MG capsule Take 75 mg by mouth 2 (two) times daily.   Yes [provider]  aspirin 81 MG tablet Take 81 mg by mouth daily.    [provider]  FREESTYLE LITE test strip TEST BLOOD SUGAR ONCE DAILY 02/04/17   Carollee Herter, Alferd Apa, DO  Lancets (FREESTYLE) lancets Use once daily to check blood sugar.  DX E11.9 10/08/16   [name], DO  lidocaine-prilocaine (EMLA) cream Apply to affected area once 03/04/17   Ennever, Rudell Cobb, MD  LORazepam (ATIVAN) 0.5 MG tablet Take 1 tablet (0.5 mg total) by mouth every 6 (six) hours as needed (Nausea or vomiting). 03/04/17   [name], MD  nitroGLYCERIN (NITROSTAT) 0.4 MG SL tablet Place 1 tablet (0.4 mg total) under the tongue every 5 (five) minutes as needed for chest pain. 09/18/16   Minus Breeding, MD  ondansetron (ZOFRAN) 8 MG tablet Take 1 tablet (8 mg total) by mouth 2 (two) times daily as needed for refractory nausea / vomiting. Start on day 3 after cyclophosphamide chemotherapy. 03/04/17   [name], MD  prochlorperazine (COMPAZINE) 10 MG tablet Take 1 tablet (10 mg total) by mouth every 6 (six) hours as needed (Nausea or vomiting). 03/04/17   [name],  MD  saxagliptin HCl (ONGLYZA) 5 MG TABS tablet Take 1 tablet (5 mg total) by mouth daily. 02/21/17   [name], DO  traMADol (ULTRAM) 50 MG tablet Take 1 tablet (50 mg total) by mouth every 6 (six) hours as needed. 02/19/17   [place], Holli Humbles, NP     Family History  Problem Relation Age of Onset  . Stroke Father   . Hypertension Father   . Pancreatic cancer Mother   . Diabetes Maternal Grandmother   . Stroke Maternal Grandmother   . Heart attack Paternal Grandmother   . Colon cancer Neg Hx   . Esophageal cancer Neg Hx   . Rectal cancer Neg Hx   . Stomach cancer Neg Hx   . Ulcers Neg Hx     Social History   Social History  . Marital status: Divorced    Spouse name: N/A  . Number of children: 2  . Years of education: N/A   Occupational History  .  Retired   Social History Main Topics  . Smoking status: Never Smoker  . Smokeless tobacco: Never Used  . Alcohol use No     Comment: "last drink was in 2012"( 03/15/2014)  . Drug use: No  . Sexual activity: Not Currently   Other Topics Concern  . None   Social History Narrative   Divorced, lives with a roommate.   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      Review of Systems currently denies fever, headache, chest pain, dyspnea, cough, abdominal/back pain, nausea, vomiting or abnormal bleeding; does have some occasional neck pain.  Vital Signs: BP (!) 154/61   Pulse 98   Temp 97.9 F (36.6 C) (Oral)   Resp 18   SpO2 95%   Physical Exam awake, alert. Chest clear to auscultation bilaterally. Heart with bradycardic rhythm without ectopy. Positive murmur from aortic stenosis. Abdomen obese, soft, positive bowel sounds, nontender. 2+ bilateral lower extremity edema.  Mallampati Score:  Imaging: Nm Pet Image Initial (pi)  Skull Base To Thigh  Result Date: 02/14/2017 CLINICAL DATA:  Initial treatment strategy for thoracic lymphadenopathy. Possible lymphoma. EXAM: NUCLEAR MEDICINE PET SKULL BASE TO THIGH TECHNIQUE: 12.8 mCi F-18 FDG was injected intravenously. Full-ring PET imaging was performed from the skull base to thigh after the radiotracer. CT data was obtained and used for attenuation correction and anatomic localization. FASTING BLOOD GLUCOSE:  Value: 210 mg/dl COMPARISON:  Whole-body bone scan 02/04/2017. Chest CT 02/06/2017. Images from outside cervical MRI 01/28/2017-no report. FINDINGS: NECK There are hypermetabolic cervical lymph nodes bilaterally. 17 mm level 3 node on the left has an SUV max of 13.0. There are 2 adjacent level 4 nodes on the right, measuring up to 12 mm on image 50 and demonstrating an SUV max of 4.7. There is a small hypermetabolic submental node on the right.There are no lesions of the pharyngeal mucosal space. CHEST There are multiple hypermetabolic axillary and subpectoral lymph nodes bilaterally. The largest node on the left measures 3.2 x 1.6 cm on image 69 and has an SUV max of 14.7. There are few hypermetabolic subcarinal and retrocrural lymph nodes bilaterally, including a 1.8 cm right infrahilar node (image 87; SUV max 8.6) and a 1.6 cm retrocrural node on image 114 (SUV max 12.2). There is no suspicious pulmonary activity. There is stable mild scarring or atelectasis in both lungs. The heart is enlarged post median sternotomy and CABG. Probable aortic valvular calcifications. ABDOMEN/PELVIS There is no hypermetabolic activity within the liver, adrenal glands, spleen or pancreas. The spleen is not enlarged. There are several small hypermetabolic retroperitoneal lymph nodes, extending to the level of the aortic bifurcation. No hypermetabolic pelvic lymph nodes are seen. Aortic and branch vessel atherosclerosis noted. The right common iliac artery is mildly dilated (2.2 cm). SKELETON C3 lesion  noted on cervical MRI is hypermetabolic (SUV max 69.6). There is also hypermetabolic activity within the left aspect of the sacrum, adjacent to the sacroiliac joint. This has an SUV max of 7.7. No lytic lesion or pathologic fracture identified. IMPRESSION: 1. Multifocal hypermetabolic lymphadenopathy within the neck, chest and upper abdomen as described. Findings are worrisome for lymphoma. Tissue sampling recommended. 2. Hypermetabolic osseous lesions within the C3 vertebral body and left sacrum, also suspicious for lymphomatous involvement. 3. No evidence of solid organ involvement or splenomegaly. 4.  Aortic Atherosclerosis (ICD10-I70.0). Electronically Signed   By: Richardean Sale M.D.   On: 02/14/2017 10:39   Dg Chest Port 1 View  Result Date: 03/06/2017 CLINICAL DATA:  76 year old male status post right Port-A-Cath placement. Lymphoma. EXAM: PORTABLE CHEST 1 VIEW COMPARISON:  PET-CT 02/14/2017 and earlier. FINDINGS: Portable AP upright view at 1449 hours. Right subclavian approach power injectable porta cath. Catheter tip at the lower SVC level. Prior CABG. Stable cardiac size and mediastinal contours. No pneumothorax. No pulmonary edema, pleural effusion or confluent pulmonary opacity. Left axillary surgical clips are new. IMPRESSION: 1. Right subclavian approach power port placed with no adverse features. 2.  No acute cardiopulmonary abnormality. Electronically Signed   By: Genevie Ann M.D.   On: 03/06/2017 14:59   Dg Fluoro Guide Cv Line-no Report  Result Date: 03/06/2017 Fluoroscopy was utilized by the requesting physician.  No radiographic interpretation.    Labs:  CBC:  Recent Labs  02/19/17 1507 02/27/17 1350 03/07/17 0744 03/12/17 0710  WBC 7.8 9.8 10.4* 15.9*  HGB 13.5 13.3 13.2 12.4*  HCT 40.4 39.3 39.3 37.4*  PLT 213 336 247 155  COAGS:  Recent Labs  03/12/17 0710  INR 1.00  APTT 31    BMP:  Recent Labs  04/17/16 1455  02/18/17 1438 02/19/17 1506 02/27/17 1350  03/07/17 0744  NA 139  < > 139 139 140 142  K 4.4  < > 4.2 4.1 3.6 4.1  CL 101  < > 99 101 102 102  CO2 30*  < > 30 26 29 28   GLUCOSE 95  < > 136* 155* 123* 174*  BUN 22  < > 18 20 24* 20  CALCIUM 10.2  < > 10.1 9.8 9.9 9.8  CREATININE 1.38*  < > 0.95 0.90 1.1 0.9  GFRNONAA 49*  --   --  82  --   --   GFRAA 57*  --   --  94  --   --   < > = values in this interval not displayed.  LIVER FUNCTION TESTS:  Recent Labs  02/18/17 1438 02/19/17 1506 02/27/17 1350 03/07/17 0744  BILITOT 0.7 0.5 0.70 0.80  AST 19 21 32 23  ALT 25 32 43 30  ALKPHOS 106 116 121* 119*  PROT 7.6 7.1 7.3 7.0  ALBUMIN 4.1 4.0 3.4 3.7    TUMOR MARKERS: No results for input(s): AFPTM, CEA, CA199, CHROMGRNA in the last 8760 hours.  Assessment and Plan: 76 y.o. male with history of recently diagnosed diffuse  large B cell lymphoma (NHL) who presents today for CT-guided bone marrow biopsy for staging purposes. Risks and benefits discussed with the patient including, but not limited to bleeding, infection, damage to adjacent structures or low yield requiring additional tests.All of the patient's questions were answered, patient is agreeable to proceed.Consent signed and in chart.     Thank you for this interesting consult.  I greatly enjoyed meeting Richard [name] and look forward to participating in their care.  A copy of this report was sent to the requesting provider on this date.  Electronically Signed: D. Rowe Robert, PA-C 03/12/2017, 8:37 AM   I spent a total of 25 minutes  in face to face in clinical consultation, greater than 50% of which was counseling/coordinating care for CT-guided bone marrow biopsy

## 2017-03-12 NOTE — Sedation Documentation (Signed)
Patient denies pain and is resting comfortably.  

## 2017-03-12 NOTE — Procedures (Signed)
Lymphoma  S/p CT Rt iliac bm asp and core bx  No comp Stable EBL 5cc Path pending Full report in pacs

## 2017-03-12 NOTE — Discharge Instructions (Signed)
Bone Marrow Aspiration and Bone Marrow Biopsy, Adult, Care After °This sheet gives you information about how to care for yourself after your procedure. Your health care provider may also give you more specific instructions. If you have problems or questions, contact your health care provider. °What can I expect after the procedure? °After the procedure, it is common to have: °· Mild pain and tenderness. °· Swelling. °· Bruising. ° °Follow these instructions at home: °· Take over-the-counter or prescription medicines only as told by your health care provider. °· Do not take baths, swim, or use a hot tub until your health care provider approves. Ask if you can take a shower or have a sponge bath. °· Follow instructions from your health care provider about how to take care of the puncture site. Make sure you: °? Wash your hands with soap and water before you change your bandage (dressing). If soap and water are not available, use hand sanitizer. °? Change your dressing as told by your health care provider. °· Check your puncture site every day for signs of infection. Check for: °? More redness, swelling, or pain. °? More fluid or blood. °? Warmth. °? Pus or a bad smell. °· Return to your normal activities as told by your health care provider. Ask your health care provider what activities are safe for you. °· Do not drive for 24 hours if you were given a medicine to help you relax (sedative). °· Keep all follow-up visits as told by your health care provider. This is important. °Contact a health care provider if: °· You have more redness, swelling, or pain around the puncture site. °· You have more fluid or blood coming from the puncture site. °· Your puncture site feels warm to the touch. °· You have pus or a bad smell coming from the puncture site. °· You have a fever. °· Your pain is not controlled with medicine. °This information is not intended to replace advice given to you by your health care provider. Make sure  you discuss any questions you have with your health care provider. °Document Released: 01/04/2005 Document Revised: 01/05/2016 Document Reviewed: 11/29/2015 °Elsevier Interactive Patient Education © 2018 Elsevier Inc. ° °Moderate Conscious Sedation, Adult, Care After °These instructions provide you with information about caring for yourself after your procedure. Your health care provider may also give you more specific instructions. Your treatment has been planned according to current medical practices, but problems sometimes occur. Call your health care provider if you have any problems or questions after your procedure. °What can I expect after the procedure? °After your procedure, it is common: °· To feel sleepy for several hours. °· To feel clumsy and have poor balance for several hours. °· To have poor judgment for several hours. °· To vomit if you eat too soon. ° °Follow these instructions at home: °For at least 24 hours after the procedure: ° °· Do not: °? Participate in activities where you could fall or become injured. °? Drive. °? Use heavy machinery. °? Drink alcohol. °? Take sleeping pills or medicines that cause drowsiness. °? Make important decisions or sign legal documents. °? Take care of children on your own. °· Rest. °Eating and drinking °· Follow the diet recommended by your health care provider. °· If you vomit: °? Drink water, juice, or soup when you can drink without vomiting. °? Make sure you have little or no nausea before eating solid foods. °General instructions °· Have a responsible adult stay with you until you   are awake and alert.  Take over-the-counter and prescription medicines only as told by your health care provider.  If you smoke, do not smoke without supervision.  Keep all follow-up visits as told by your health care provider. This is important. Contact a health care provider if:  You keep feeling nauseous or you keep vomiting.  You feel light-headed.  You develop a  rash.  You have a fever. Get help right away if:  You have trouble breathing. This information is not intended to replace advice given to you by your health care provider. Make sure you discuss any questions you have with your health care provider. Document Released: 04/07/2013 Document Revised: 11/20/2015 Document Reviewed: 10/07/2015 Elsevier Interactive Patient Education  [employer].

## 2017-03-17 ENCOUNTER — Encounter: Payer: Self-pay | Admitting: *Deleted

## 2017-03-17 ENCOUNTER — Telehealth: Payer: Self-pay

## 2017-03-17 LAB — TISSUE HYBRIDIZATION TO NCBH

## 2017-03-17 NOTE — Telephone Encounter (Signed)
Spoke with Richard [name] in pathology re: "double hit" results. Per Richard [name], [facility] results are not in yet, but are expected within 3-4 days. Dr Marin Olp aware. dph

## 2017-03-19 ENCOUNTER — Other Ambulatory Visit: Payer: Medicare Other

## 2017-03-19 ENCOUNTER — Ambulatory Visit: Payer: Medicare Other

## 2017-03-20 ENCOUNTER — Ambulatory Visit (HOSPITAL_BASED_OUTPATIENT_CLINIC_OR_DEPARTMENT_OTHER): Payer: Medicare Other

## 2017-03-20 ENCOUNTER — Ambulatory Visit: Payer: Medicare Other

## 2017-03-20 ENCOUNTER — Ambulatory Visit (HOSPITAL_BASED_OUTPATIENT_CLINIC_OR_DEPARTMENT_OTHER): Payer: Medicare Other | Admitting: Hematology & Oncology

## 2017-03-20 ENCOUNTER — Other Ambulatory Visit (HOSPITAL_BASED_OUTPATIENT_CLINIC_OR_DEPARTMENT_OTHER): Payer: Medicare Other

## 2017-03-20 VITALS — BP 117/62 | HR 89 | Temp 98.0°F | Resp 18 | Wt 251.0 lb

## 2017-03-20 DIAGNOSIS — C8338 Diffuse large B-cell lymphoma, lymph nodes of multiple sites: Secondary | ICD-10-CM | POA: Diagnosis not present

## 2017-03-20 DIAGNOSIS — D510 Vitamin B12 deficiency anemia due to intrinsic factor deficiency: Secondary | ICD-10-CM

## 2017-03-20 DIAGNOSIS — C8332 Diffuse large B-cell lymphoma, intrathoracic lymph nodes: Secondary | ICD-10-CM

## 2017-03-20 DIAGNOSIS — C8382 Other non-follicular lymphoma, intrathoracic lymph nodes: Secondary | ICD-10-CM | POA: Diagnosis not present

## 2017-03-20 DIAGNOSIS — D508 Other iron deficiency anemias: Secondary | ICD-10-CM

## 2017-03-20 DIAGNOSIS — D519 Vitamin B12 deficiency anemia, unspecified: Secondary | ICD-10-CM

## 2017-03-20 LAB — CBC WITH DIFFERENTIAL (CANCER CENTER ONLY)
BASO#: 0 10*3/uL (ref 0.0–0.2)
BASO%: 0.5 % (ref 0.0–2.0)
EOS%: 0.6 % (ref 0.0–7.0)
Eosinophils Absolute: 0 10*3/uL (ref 0.0–0.5)
HEMATOCRIT: 37.4 % — AB (ref 38.7–49.9)
HEMOGLOBIN: 12.3 g/dL — AB (ref 13.0–17.1)
LYMPH#: 0.7 10*3/uL — AB (ref 0.9–3.3)
LYMPH%: 11.1 % — ABNORMAL LOW (ref 14.0–48.0)
MCH: 28.5 pg (ref 28.0–33.4)
MCHC: 32.9 g/dL (ref 32.0–35.9)
MCV: 87 fL (ref 82–98)
MONO#: 0.5 10*3/uL (ref 0.1–0.9)
MONO%: 7.8 % (ref 0.0–13.0)
NEUT%: 80 % (ref 40.0–80.0)
NEUTROS ABS: 5 10*3/uL (ref 1.5–6.5)
Platelets: 175 10*3/uL (ref 145–400)
RBC: 4.31 10*6/uL (ref 4.20–5.70)
RDW: 16.7 % — ABNORMAL HIGH (ref 11.1–15.7)
WBC: 6.3 10*3/uL (ref 4.0–10.0)

## 2017-03-20 LAB — CMP (CANCER CENTER ONLY)
ALBUMIN: 3.5 g/dL (ref 3.3–5.5)
ALK PHOS: 122 U/L — AB (ref 26–84)
ALT: 27 U/L (ref 10–47)
AST: 22 U/L (ref 11–38)
BILIRUBIN TOTAL: 0.7 mg/dL (ref 0.20–1.60)
BUN, Bld: 10 mg/dL (ref 7–22)
CALCIUM: 9.5 mg/dL (ref 8.0–10.3)
CO2: 29 mEq/L (ref 18–33)
Chloride: 101 mEq/L (ref 98–108)
Creat: 1 mg/dl (ref 0.6–1.2)
Glucose, Bld: 124 mg/dL — ABNORMAL HIGH (ref 73–118)
Potassium: 3.5 mEq/L (ref 3.3–4.7)
Sodium: 141 mEq/L (ref 128–145)
Total Protein: 6.5 g/dL (ref 6.4–8.1)

## 2017-03-20 MED ORDER — CYANOCOBALAMIN 1000 MCG/ML IJ SOLN
INTRAMUSCULAR | Status: AC
  Filled 2017-03-20: qty 1

## 2017-03-20 MED ORDER — CYANOCOBALAMIN 1000 MCG/ML IJ SOLN
1000.0000 ug | Freq: Once | INTRAMUSCULAR | Status: AC
  Administered 2017-03-20: 1000 ug via INTRAMUSCULAR

## 2017-03-20 MED ORDER — SODIUM CHLORIDE 0.9% FLUSH
10.0000 mL | INTRAVENOUS | Status: DC | PRN
  Administered 2017-03-20: 10 mL via INTRAVENOUS
  Filled 2017-03-20: qty 10

## 2017-03-20 MED ORDER — HEPARIN SOD (PORK) LOCK FLUSH 100 UNIT/ML IV SOLN
500.0000 [IU] | Freq: Once | INTRAVENOUS | Status: AC
  Administered 2017-03-20: 500 [IU] via INTRAVENOUS
  Filled 2017-03-20: qty 5

## 2017-03-20 NOTE — Patient Instructions (Signed)
Implanted Port Insertion, Care After °This sheet gives you information about how to care for yourself after your procedure. Your health care provider may also give you more specific instructions. If you have problems or questions, contact your health care provider. °What can I expect after the procedure? °After your procedure, it is common to have: °· Discomfort at the port insertion site. °· Bruising on the skin over the port. This should improve over 3-4 days. ° °Follow these instructions at home: °Port care °· After your port is placed, you will get a manufacturer's information card. The card has information about your port. Keep this card with you at all times. °· Take care of the port as told by your health care provider. Ask your health care provider if you or a family member can get training for taking care of the port at home. A home health care nurse may also take care of the port. °· Make sure to remember what type of port you have. °Incision care °· Follow instructions from your health care provider about how to take care of your port insertion site. Make sure you: °? Wash your hands with soap and water before you change your bandage (dressing). If soap and water are not available, use hand sanitizer. °? Change your dressing as told by your health care provider. °? Leave stitches (sutures), skin glue, or adhesive strips in place. These skin closures may need to stay in place for 2 weeks or longer. If adhesive strip edges start to loosen and curl up, you may trim the loose edges. Do not remove adhesive strips completely unless your health care provider tells you to do that. °· Check your port insertion site every day for signs of infection. Check for: °? More redness, swelling, or pain. °? More fluid or blood. °? Warmth. °? Pus or a bad smell. °General instructions °· Do not take baths, swim, or use a hot tub until your health care provider approves. °· Do not lift anything that is heavier than 10 lb (4.5  kg) for a week, or as told by your health care provider. °· Ask your health care provider when it is okay to: °? Return to work or school. °? Resume usual physical activities or sports. °· Do not drive for 24 hours if you were given a medicine to help you relax (sedative). °· Take over-the-counter and prescription medicines only as told by your health care provider. °· Wear a medical alert bracelet in case of an emergency. This will tell any health care providers that you have a port. °· Keep all follow-up visits as told by your health care provider. This is important. °Contact a health care provider if: °· You cannot flush your port with saline as directed, or you cannot draw blood from the port. °· You have a fever or chills. °· You have more redness, swelling, or pain around your port insertion site. °· You have more fluid or blood coming from your port insertion site. °· Your port insertion site feels warm to the touch. °· You have pus or a bad smell coming from the port insertion site. °Get help right away if: °· You have chest pain or shortness of breath. °· You have bleeding from your port that you cannot control. °Summary °· Take care of the port as told by your health care provider. °· Change your dressing as told by your health care provider. °· Keep all follow-up visits as told by your health care provider. °  This information is not intended to replace advice given to you by your health care provider. Make sure you discuss any questions you have with your health care provider. °Document Released: 04/07/2013 Document Revised: 05/08/2016 Document Reviewed: 05/08/2016 °Elsevier Interactive Patient Education © 2017 Elsevier Inc. ° °

## 2017-03-20 NOTE — Progress Notes (Signed)
Hematology and Oncology Follow Up Visit  Richard [name] [id number] 08-17-1940 76 y.o. 03/20/2017   Principle Diagnosis:   Diffuse large cell non-Hodgkin's lymphoma (IPI = 3) - NOT "double hit"  Current Therapy:   R-CHOP - s/p cycle # 1     Interim History   Richard [name] comes back for follow-up. He looks fantastic. He absolutely has had no problems with the first cycle of R-CHOP. He's not even lost his hair yet.  His pain is gone. He does not have any back or neck pain. He wants to play golf. He has had no fever. He's had no problems with nausea or vomiting. Per every did have a bone marrow biopsy done. This was done last week. The bone marrow report ([id number]) showed no evidence of non-Hodgkin's lymphoma in the bone marrow.  Currently, his IPI score equals 3 which puts him in an intermediate risk group.  He's had no diarrhea. He's had no mouth sores. He's had no leg swelling. He's had no rashes.  Overall, his performance status is ECOG 1.    Medications:  Current Outpatient Prescriptions:  .  allopurinol (ZYLOPRIM) 300 MG tablet, Take 450 mg by mouth daily. , Disp: , Rfl:  .  amLODipine (NORVASC) 5 MG tablet, TAKE 2 TABLETS (10 MG TOTAL) BY MOUTH DAILY. (Patient taking differently: TAKE 1 TABLETS BY MOUTH TWICE DAILY.), Disp: 60 tablet, Rfl: 11 .  aspirin 81 MG tablet, Take 81 mg by mouth daily., Disp: , Rfl:  .  atorvastatin (LIPITOR) 80 MG tablet, TAKE ONE TABLET BY MOUTH AT BEDTIME, Disp: 90 tablet, Rfl: 3 .  esomeprazole (NEXIUM) 40 MG capsule, Take 1 capsule (40 mg total) by mouth daily., Disp: 30 capsule, Rfl: 5 .  ezetimibe (ZETIA) 10 MG tablet, TAKE ONE TABLET BY MOUTH DAILY, Disp: 90 tablet, Rfl: 3 .  famciclovir (FAMVIR) 500 MG tablet, Take 1 tablet (500 mg total) by mouth daily., Disp: 30 tablet, Rfl: 12 .  fenofibrate 160 MG tablet, TAKE 1 TABLET BY MOUTH EVERY DAY, Disp: 30 tablet, Rfl: 5 .  fluticasone (FLONASE) 50 MCG/ACT nasal spray, INHALE ONE SPRAY INTO EACH  NOSTRIL TWICE DAILY. (Patient taking differently: INHALE ONE SPRAY INTO EACH NOSTRIL TWICE DAILY AS NEEDED FOR ALLERGIES), Disp: 1 g, Rfl: 6 .  FREESTYLE LITE test strip, TEST BLOOD SUGAR ONCE DAILY, Disp: 100 each, Rfl: 0 .  furosemide (LASIX) 40 MG tablet, TAKE 2 TABLETS (80 MG TOTAL) BY MOUTH 2 (TWO) TIMES DAILY., Disp: 360 tablet, Rfl: 0 .  glimepiride (AMARYL) 2 MG tablet, TAKE 1 TABLET (2 MG TOTAL) BY MOUTH DAILY WITH BREAKFAST., Disp: 90 tablet, Rfl: 0 .  glucose 5 g chewable tablet, Chew 15 g by mouth as needed for low blood sugar., Disp: , Rfl:  .  HYDROcodone-acetaminophen (NORCO) 5-325 MG tablet, Take 1-2 tablets by mouth every 6 (six) hours as needed for moderate pain or severe pain., Disp: 90 tablet, Rfl: 0 .  KLOR-CON M20 20 MEQ tablet, TAKE ONE TABLET BY MOUTH TWICE DAILY, Disp: 60 tablet, Rfl: 11 .  Lancets (FREESTYLE) lancets, Use once daily to check blood sugar.  DX E11.9, Disp: 100 each, Rfl: 6 .  lidocaine-prilocaine (EMLA) cream, Apply to affected area once, Disp: 30 g, Rfl: 3 .  LORazepam (ATIVAN) 0.5 MG tablet, Take 1 tablet (0.5 mg total) by mouth every 6 (six) hours as needed (Nausea or vomiting)., Disp: 30 tablet, Rfl: 0 .  Menthol, Topical Analgesic, (ICY HOT EX), Apply 1 application topically  daily as needed (muscle pain)., Disp: , Rfl:  .  metFORMIN (GLUCOPHAGE) 1000 MG tablet, TAKE ONE AND ONE-HALF TABLETS BY MOUTH EVERY MORNING AND 1 TALBET IN THE EVENING., Disp: 225 tablet, Rfl: 1 .  metoprolol tartrate (LOPRESSOR) 25 MG tablet, TAKE ONE-HALF TABLET BY MOUTH TWICE DAILY (Patient taking differently: TAKE ONE-HALF TABLET BY MOUTH IN THE MORNING AND HALF TABLET IN THE EVENING), Disp: 90 tablet, Rfl: 3 .  montelukast (SINGULAIR) 10 MG tablet, Take 1 tablet (10 mg total) by mouth at bedtime., Disp: 90 tablet, Rfl: 1 .  nitroGLYCERIN (NITROSTAT) 0.4 MG SL tablet, Place 1 tablet (0.4 mg total) under the tongue every 5 (five) minutes as needed for chest pain., Disp: 25 tablet,  Rfl: 3 .  ondansetron (ZOFRAN) 8 MG tablet, Take 1 tablet (8 mg total) by mouth 2 (two) times daily as needed for refractory nausea / vomiting. Start on day 3 after cyclophosphamide chemotherapy., Disp: 30 tablet, Rfl: 1 .  polycarbophil (FIBERCON) 625 MG tablet, Take 625 mg by mouth daily., Disp: , Rfl:  .  predniSONE (DELTASONE) 20 MG tablet, Take 3 pills a day, with food, for 5 days. Start the day after each chemotherapy, Disp: 120 tablet, Rfl: 0 .  pregabalin (LYRICA) 75 MG capsule, Take 75 mg by mouth 2 (two) times daily., Disp: , Rfl:  .  prochlorperazine (COMPAZINE) 10 MG tablet, Take 1 tablet (10 mg total) by mouth every 6 (six) hours as needed (Nausea or vomiting)., Disp: 30 tablet, Rfl: 6 .  saxagliptin HCl (ONGLYZA) 5 MG TABS tablet, Take 1 tablet (5 mg total) by mouth daily., Disp: 30 tablet, Rfl: 2 .  traMADol (ULTRAM) 50 MG tablet, , Disp: , Rfl:  No current facility-administered medications for this visit.   Facility-Administered Medications Ordered in Other Visits:  .  sodium chloride flush (NS) 0.9 % injection 10 mL, 10 mL, Intravenous, PRN, [name], MD, 10 mL at 03/20/17 1543  Allergies:  Allergies  Allergen Reactions  . Benazepril Swelling    angioedema; he is not a candidate for any angiotensin receptor blockers because of this significant allergic reaction. Because of a history of documented adverse serious drug reaction;Medi Alert bracelet  is recommended  . Hctz [Hydrochlorothiazide] Anaphylaxis and Swelling    Tongue and lip swelling   . Aspirin Other (See Comments)    Gastritis, cant take 325 Mg aspirin   . Lactose Intolerance (Gi) Nausea And Vomiting    Past Medical History, Surgical history, Social history, and Family History were reviewed and updated.  Review of Systems: Review of Systems  Constitutional: Negative for appetite change, fatigue, fever and unexpected weight change.  HENT:   Negative for lump/mass, mouth sores, sore throat and trouble  swallowing.   Respiratory: Negative for cough, hemoptysis and shortness of breath.   Cardiovascular: Negative for leg swelling and palpitations.  Gastrointestinal: Negative for abdominal distention, abdominal pain, blood in stool, constipation, diarrhea, nausea and vomiting.  Genitourinary: Negative for bladder incontinence, dysuria, frequency and hematuria.   Musculoskeletal: Negative for arthralgias, gait problem and myalgias.  Skin: Negative for itching and rash.  Neurological: Negative for dizziness, extremity weakness, gait problem, headaches, numbness, seizures and speech difficulty.  Hematological: Does not bruise/bleed easily.  Psychiatric/Behavioral: Negative for depression and sleep disturbance. The patient is not nervous/anxious.     Physical Exam:  weight is 251 lb (113.9 kg). His oral temperature is 98 F (36.7 C). His blood pressure is 117/62 and his pulse is 89. His respiration  is 18.   Wt Readings from Last 3 Encounters:  03/20/17 251 lb (113.9 kg)  03/06/17 254 lb (115.2 kg)  02/27/17 254 lb (115.2 kg)    Physical Exam  Constitutional: He is oriented to person, place, and time.  HENT:  Head: Normocephalic and atraumatic.  Mouth/Throat: Oropharynx is clear and moist.  Eyes: Pupils are equal, round, and reactive to light. EOM are normal.  Neck: Normal range of motion.  Cardiovascular: Normal rate, regular rhythm and normal heart sounds.   Pulmonary/Chest: Effort normal and breath sounds normal.  Abdominal: Soft. Bowel sounds are normal.  Musculoskeletal: Normal range of motion. He exhibits no edema, tenderness or deformity.  Lymphadenopathy:    He has no cervical adenopathy.  Neurological: He is alert and oriented to person, place, and time.  Skin: Skin is warm and dry. No rash noted. No erythema.  Psychiatric: He has a normal mood and affect. His behavior is normal. Judgment and thought content normal.  Vitals reviewed.    Lab Results  Component Value Date     WBC 6.3 03/20/2017   HGB 12.3 (L) 03/20/2017   HCT 37.4 (L) 03/20/2017   MCV 87 03/20/2017   PLT 175 03/20/2017     Chemistry      Component Value Date/Time   NA 141 03/20/2017 1504   K 3.5 03/20/2017 1504   CL 101 03/20/2017 1504   CO2 29 03/20/2017 1504   BUN 10 03/20/2017 1504   CREATININE 1.0 03/20/2017 1504      Component Value Date/Time   CALCIUM 9.5 03/20/2017 1504   ALKPHOS 122 (H) 03/20/2017 1504   AST 22 03/20/2017 1504   ALT 27 03/20/2017 1504   BILITOT 0.70 03/20/2017 1504         Impression and Plan: Richard [name] is a 76 year old white male. He has diffuse large B-cell lymphoma. He does not have a double hit lymphoma.  We will go ahead with his second cycle of chemotherapy next week.  We will do his PET scan after this second cycle of treatment. I would think that the PET scan should be normal.  We will plan to see him back for the third cycle of treatment. He really has done nicely.  I told that this next cycle treatment she'll only take hopefully no more than 3 hours. We can really give the Rituxan quickly since he had no problems with the initial Rituxan dose.  [name], MD 9/20/20184:34 PM

## 2017-03-20 NOTE — Patient Instructions (Signed)
Cyanocobalamin, Vitamin B12 injection What is this medicine? CYANOCOBALAMIN (sye an oh koe BAL a min) is a man made form of vitamin B12. Vitamin B12 is used in the growth of healthy blood cells, nerve cells, and proteins in the body. It also helps with the metabolism of fats and carbohydrates. This medicine is used to treat people who can not absorb vitamin B12. This medicine may be used for other purposes; ask your health care provider or pharmacist if you have questions. COMMON BRAND NAME(S): B-12 Compliance Kit, B-12 Injection Kit, Cyomin, LA-12, Nutri-Twelve, Physicians EZ Use B-12, Primabalt What should I tell my health care provider before I take this medicine? They need to know if you have any of these conditions: -kidney disease -Leber's disease -megaloblastic anemia -an unusual or allergic reaction to cyanocobalamin, cobalt, other medicines, foods, dyes, or preservatives -pregnant or trying to get pregnant -breast-feeding How should I use this medicine? This medicine is injected into a muscle or deeply under the skin. It is usually given by a health care professional in a clinic or doctor's office. However, your doctor may teach you how to inject yourself. Follow all instructions. Talk to your pediatrician regarding the use of this medicine in children. Special care may be needed. Overdosage: If you think you have taken too much of this medicine contact a poison control center or emergency room at once. NOTE: This medicine is only for you. Do not share this medicine with others. What if I miss a dose? If you are given your dose at a clinic or doctor's office, call to reschedule your appointment. If you give your own injections and you miss a dose, take it as soon as you can. If it is almost time for your next dose, take only that dose. Do not take double or extra doses. What may interact with this medicine? -colchicine -heavy alcohol intake This list may not describe all possible  interactions. Give your health care provider a list of all the medicines, herbs, non-prescription drugs, or dietary supplements you use. Also tell them if you smoke, drink alcohol, or use illegal drugs. Some items may interact with your medicine. What should I watch for while using this medicine? Visit your doctor or health care professional regularly. You may need blood work done while you are taking this medicine. You may need to follow a special diet. Talk to your doctor. Limit your alcohol intake and avoid smoking to get the best benefit. What side effects may I notice from receiving this medicine? Side effects that you should report to your doctor or health care professional as soon as possible: -allergic reactions like skin rash, itching or hives, swelling of the face, lips, or tongue -blue tint to skin -chest tightness, pain -difficulty breathing, wheezing -dizziness -red, swollen painful area on the leg Side effects that usually do not require medical attention (report to your doctor or health care professional if they continue or are bothersome): -diarrhea -headache This list may not describe all possible side effects. Call your doctor for medical advice about side effects. You may report side effects to FDA at 1-800-FDA-1088. Where should I keep my medicine? Keep out of the reach of children. Store at room temperature between 15 and 30 degrees C (59 and 85 degrees F). Protect from light. Throw away any unused medicine after the expiration date. NOTE: This sheet is a summary. It may not cover all possible information. If you have questions about this medicine, talk to your doctor, pharmacist, or  health care provider.  2018 Elsevier/Gold Standard (2007-09-28 22:10:20)  

## 2017-03-21 LAB — URIC ACID (CC13): URIC ACID: 5 mg/dL (ref 3.7–8.6)

## 2017-03-21 LAB — LACTATE DEHYDROGENASE: LDH: 218 U/L (ref 125–245)

## 2017-03-25 ENCOUNTER — Encounter (HOSPITAL_COMMUNITY): Payer: Self-pay

## 2017-03-28 ENCOUNTER — Ambulatory Visit (HOSPITAL_BASED_OUTPATIENT_CLINIC_OR_DEPARTMENT_OTHER): Payer: Medicare Other | Admitting: Hematology & Oncology

## 2017-03-28 ENCOUNTER — Other Ambulatory Visit: Payer: Self-pay | Admitting: *Deleted

## 2017-03-28 ENCOUNTER — Ambulatory Visit (HOSPITAL_BASED_OUTPATIENT_CLINIC_OR_DEPARTMENT_OTHER): Payer: Medicare Other

## 2017-03-28 ENCOUNTER — Ambulatory Visit: Payer: Medicare Other

## 2017-03-28 ENCOUNTER — Other Ambulatory Visit (HOSPITAL_BASED_OUTPATIENT_CLINIC_OR_DEPARTMENT_OTHER): Payer: Medicare Other

## 2017-03-28 VITALS — BP 123/49 | HR 72 | Temp 97.6°F | Resp 18

## 2017-03-28 VITALS — BP 150/76 | HR 91 | Temp 98.6°F | Resp 18 | Wt 250.0 lb

## 2017-03-28 DIAGNOSIS — C8332 Diffuse large B-cell lymphoma, intrathoracic lymph nodes: Secondary | ICD-10-CM

## 2017-03-28 DIAGNOSIS — Z5112 Encounter for antineoplastic immunotherapy: Secondary | ICD-10-CM | POA: Diagnosis not present

## 2017-03-28 DIAGNOSIS — C8330 Diffuse large B-cell lymphoma, unspecified site: Secondary | ICD-10-CM

## 2017-03-28 DIAGNOSIS — Z5111 Encounter for antineoplastic chemotherapy: Secondary | ICD-10-CM | POA: Diagnosis not present

## 2017-03-28 LAB — CBC WITH DIFFERENTIAL (CANCER CENTER ONLY)
BASO#: 0.1 10*3/uL (ref 0.0–0.2)
BASO%: 0.8 % (ref 0.0–2.0)
EOS ABS: 0.1 10*3/uL (ref 0.0–0.5)
EOS%: 1.7 % (ref 0.0–7.0)
HCT: 38.1 % — ABNORMAL LOW (ref 38.7–49.9)
HEMOGLOBIN: 12.4 g/dL — AB (ref 13.0–17.1)
LYMPH#: 0.9 10*3/uL (ref 0.9–3.3)
LYMPH%: 12 % — ABNORMAL LOW (ref 14.0–48.0)
MCH: 28.6 pg (ref 28.0–33.4)
MCHC: 32.5 g/dL (ref 32.0–35.9)
MCV: 88 fL (ref 82–98)
MONO#: 0.9 10*3/uL (ref 0.1–0.9)
MONO%: 11.5 % (ref 0.0–13.0)
NEUT%: 74 % (ref 40.0–80.0)
NEUTROS ABS: 5.7 10*3/uL (ref 1.5–6.5)
Platelets: 289 10*3/uL (ref 145–400)
RBC: 4.34 10*6/uL (ref 4.20–5.70)
RDW: 17.5 % — ABNORMAL HIGH (ref 11.1–15.7)
WBC: 7.7 10*3/uL (ref 4.0–10.0)

## 2017-03-28 LAB — CMP (CANCER CENTER ONLY)
ALK PHOS: 124 U/L — AB (ref 26–84)
ALT: 27 U/L (ref 10–47)
AST: 25 U/L (ref 11–38)
Albumin: 3.9 g/dL (ref 3.3–5.5)
BILIRUBIN TOTAL: 0.9 mg/dL (ref 0.20–1.60)
BUN: 11 mg/dL (ref 7–22)
CO2: 30 mEq/L (ref 18–33)
CREATININE: 0.9 mg/dL (ref 0.6–1.2)
Calcium: 9.5 mg/dL (ref 8.0–10.3)
Chloride: 104 mEq/L (ref 98–108)
GLUCOSE: 132 mg/dL — AB (ref 73–118)
Potassium: 3.5 mEq/L (ref 3.3–4.7)
Sodium: 142 mEq/L (ref 128–145)
Total Protein: 6.9 g/dL (ref 6.4–8.1)

## 2017-03-28 LAB — LACTATE DEHYDROGENASE: LDH: 216 U/L (ref 125–245)

## 2017-03-28 LAB — TECHNOLOGIST REVIEW CHCC SATELLITE

## 2017-03-28 MED ORDER — HYDROCODONE-ACETAMINOPHEN 5-325 MG PO TABS: 1.0000 | ORAL_TABLET | Freq: Four times a day (QID) | ORAL | 0 refills | Status: DC | PRN

## 2017-03-28 MED ORDER — DIPHENHYDRAMINE HCL 25 MG PO CAPS
ORAL_CAPSULE | ORAL | Status: AC
  Filled 2017-03-28: qty 1

## 2017-03-28 MED ORDER — DIPHENHYDRAMINE HCL 25 MG PO CAPS
50.0000 mg | ORAL_CAPSULE | Freq: Once | ORAL | Status: AC
Start: 2017-03-28 — End: 2017-03-28
  Administered 2017-03-28: 50 mg via ORAL

## 2017-03-28 MED ORDER — DEXAMETHASONE SODIUM PHOSPHATE 10 MG/ML IJ SOLN
10.0000 mg | Freq: Once | INTRAMUSCULAR | Status: AC
  Administered 2017-03-28: 10 mg via INTRAVENOUS

## 2017-03-28 MED ORDER — ACETAMINOPHEN 325 MG PO TABS
650.0000 mg | ORAL_TABLET | Freq: Once | ORAL | Status: AC
  Administered 2017-03-28: 650 mg via ORAL

## 2017-03-28 MED ORDER — PEGFILGRASTIM 6 MG/0.6ML ~~LOC~~ PSKT
PREFILLED_SYRINGE | SUBCUTANEOUS | Status: AC
  Filled 2017-03-28: qty 0.6

## 2017-03-28 MED ORDER — SODIUM CHLORIDE 0.9 % IV SOLN
750.0000 mg/m2 | Freq: Once | INTRAVENOUS | Status: AC
  Administered 2017-03-28: 1820 mg via INTRAVENOUS
  Filled 2017-03-28: qty 91

## 2017-03-28 MED ORDER — PALONOSETRON HCL INJECTION 0.25 MG/5ML
0.2500 mg | Freq: Once | INTRAVENOUS | Status: AC
  Administered 2017-03-28: 0.25 mg via INTRAVENOUS

## 2017-03-28 MED ORDER — SODIUM CHLORIDE 0.9% FLUSH
10.0000 mL | INTRAVENOUS | Status: DC | PRN
  Administered 2017-03-28: 10 mL
  Filled 2017-03-28: qty 10

## 2017-03-28 MED ORDER — ACETAMINOPHEN 325 MG PO TABS
ORAL_TABLET | ORAL | Status: AC
  Filled 2017-03-28: qty 2

## 2017-03-28 MED ORDER — RITUXIMAB CHEMO INJECTION 500 MG/50ML
375.0000 mg/m2 | Freq: Once | INTRAVENOUS | Status: AC
  Administered 2017-03-28: 900 mg via INTRAVENOUS
  Filled 2017-03-28: qty 50

## 2017-03-28 MED ORDER — VINCRISTINE SULFATE CHEMO INJECTION 1 MG/ML
2.0000 mg | Freq: Once | INTRAVENOUS | Status: AC
  Administered 2017-03-28: 2 mg via INTRAVENOUS
  Filled 2017-03-28: qty 2

## 2017-03-28 MED ORDER — PEGFILGRASTIM 6 MG/0.6ML ~~LOC~~ PSKT
6.0000 mg | PREFILLED_SYRINGE | Freq: Once | SUBCUTANEOUS | Status: AC
  Administered 2017-03-28: 6 mg via SUBCUTANEOUS

## 2017-03-28 MED ORDER — PALONOSETRON HCL INJECTION 0.25 MG/5ML
INTRAVENOUS | Status: AC
  Filled 2017-03-28: qty 5

## 2017-03-28 MED ORDER — DOXORUBICIN HCL CHEMO IV INJECTION 2 MG/ML
50.0000 mg/m2 | Freq: Once | INTRAVENOUS | Status: AC
  Administered 2017-03-28: 122 mg via INTRAVENOUS
  Filled 2017-03-28: qty 61

## 2017-03-28 MED ORDER — DEXAMETHASONE SODIUM PHOSPHATE 10 MG/ML IJ SOLN
INTRAMUSCULAR | Status: AC
  Filled 2017-03-28: qty 1

## 2017-03-28 MED ORDER — HEPARIN SOD (PORK) LOCK FLUSH 100 UNIT/ML IV SOLN
500.0000 [IU] | Freq: Once | INTRAVENOUS | Status: AC | PRN
  Administered 2017-03-28: 500 [IU]
  Filled 2017-03-28: qty 5

## 2017-03-28 MED ORDER — SODIUM CHLORIDE 0.9 % IV SOLN
Freq: Once | INTRAVENOUS | Status: AC
  Administered 2017-03-28: 09:00:00 via INTRAVENOUS

## 2017-03-28 NOTE — Addendum Note (Signed)
Addended by: Burney Gauze R on: 03/28/2017 09:06 AM   Modules accepted: Orders

## 2017-03-28 NOTE — Progress Notes (Signed)
Hematology and Oncology Follow Up Visit  Richard [name] [id number] [birth year] 76 y.o. 03/28/2017   Principle Diagnosis:   Diffuse large cell non-Hodgkin's lymphoma (IPI = 3) - NOT "double hit"  Current Therapy:   R-CHOP - s/p cycle # 1     Interim History   Richard [name] comes back for follow-up. As expected, he is now losing his hair. As always, he takes this in stride.  He played golf yesterday. He is a little bit sore today.  He has had no problems with nausea or vomiting. He's had no diarrhea. He's had no mouth sores. He's had no cough or shortness of breath.  He has a little bit of soreness in his neck. This is no surprise.  He's had no bleeding. He's had no fever.  Overall, his performance status is ECOG 0.  Medications:  Current Outpatient Prescriptions:  .  allopurinol (ZYLOPRIM) 300 MG tablet, Take 450 mg by mouth daily. , Disp: , Rfl:  .  amLODipine (NORVASC) 5 MG tablet, TAKE 2 TABLETS (10 MG TOTAL) BY MOUTH DAILY. (Patient taking differently: TAKE 1 TABLETS BY MOUTH TWICE DAILY.), Disp: 60 tablet, Rfl: 11 .  aspirin 81 MG tablet, Take 81 mg by mouth daily., Disp: , Rfl:  .  atorvastatin (LIPITOR) 80 MG tablet, TAKE ONE TABLET BY MOUTH AT BEDTIME, Disp: 90 tablet, Rfl: 3 .  esomeprazole (NEXIUM) 40 MG capsule, Take 1 capsule (40 mg total) by mouth daily., Disp: 30 capsule, Rfl: 5 .  ezetimibe (ZETIA) 10 MG tablet, TAKE ONE TABLET BY MOUTH DAILY, Disp: 90 tablet, Rfl: 3 .  famciclovir (FAMVIR) 500 MG tablet, Take 1 tablet (500 mg total) by mouth daily., Disp: 30 tablet, Rfl: 12 .  fenofibrate 160 MG tablet, TAKE 1 TABLET BY MOUTH EVERY DAY, Disp: 30 tablet, Rfl: 5 .  fluticasone (FLONASE) 50 MCG/ACT nasal spray, INHALE ONE SPRAY INTO EACH NOSTRIL TWICE DAILY. (Patient taking differently: INHALE ONE SPRAY INTO EACH NOSTRIL TWICE DAILY AS NEEDED FOR ALLERGIES), Disp: 1 g, Rfl: 6 .  FREESTYLE LITE test strip, TEST BLOOD SUGAR ONCE DAILY, Disp: 100 each, Rfl: 0 .  furosemide  (LASIX) 40 MG tablet, TAKE 2 TABLETS (80 MG TOTAL) BY MOUTH 2 (TWO) TIMES DAILY., Disp: 360 tablet, Rfl: 0 .  glimepiride (AMARYL) 2 MG tablet, TAKE 1 TABLET (2 MG TOTAL) BY MOUTH DAILY WITH BREAKFAST., Disp: 90 tablet, Rfl: 0 .  glucose 5 g chewable tablet, Chew 15 g by mouth as needed for low blood sugar., Disp: , Rfl:  .  HYDROcodone-acetaminophen (NORCO) 5-325 MG tablet, Take 1-2 tablets by mouth every 6 (six) hours as needed for moderate pain or severe pain., Disp: 90 tablet, Rfl: 0 .  KLOR-CON M20 20 MEQ tablet, TAKE ONE TABLET BY MOUTH TWICE DAILY, Disp: 60 tablet, Rfl: 11 .  Lancets (FREESTYLE) lancets, Use once daily to check blood sugar.  DX E11.9, Disp: 100 each, Rfl: 6 .  lidocaine-prilocaine (EMLA) cream, Apply to affected area once, Disp: 30 g, Rfl: 3 .  LORazepam (ATIVAN) 0.5 MG tablet, Take 1 tablet (0.5 mg total) by mouth every 6 (six) hours as needed (Nausea or vomiting)., Disp: 30 tablet, Rfl: 0 .  Menthol, Topical Analgesic, (ICY HOT EX), Apply 1 application topically daily as needed (muscle pain)., Disp: , Rfl:  .  metFORMIN (GLUCOPHAGE) 1000 MG tablet, TAKE ONE AND ONE-HALF TABLETS BY MOUTH EVERY MORNING AND 1 TALBET IN THE EVENING., Disp: 225 tablet, Rfl: 1 .  metoprolol tartrate (LOPRESSOR)  25 MG tablet, TAKE ONE-HALF TABLET BY MOUTH TWICE DAILY (Patient taking differently: TAKE ONE-HALF TABLET BY MOUTH IN THE MORNING AND HALF TABLET IN THE EVENING), Disp: 90 tablet, Rfl: 3 .  montelukast (SINGULAIR) 10 MG tablet, Take 1 tablet (10 mg total) by mouth at bedtime., Disp: 90 tablet, Rfl: 1 .  nitroGLYCERIN (NITROSTAT) 0.4 MG SL tablet, Place 1 tablet (0.4 mg total) under the tongue every 5 (five) minutes as needed for chest pain., Disp: 25 tablet, Rfl: 3 .  ondansetron (ZOFRAN) 8 MG tablet, Take 1 tablet (8 mg total) by mouth 2 (two) times daily as needed for refractory nausea / vomiting. Start on day 3 after cyclophosphamide chemotherapy., Disp: 30 tablet, Rfl: 1 .  polycarbophil  (FIBERCON) 625 MG tablet, Take 625 mg by mouth daily., Disp: , Rfl:  .  predniSONE (DELTASONE) 20 MG tablet, Take 3 pills a day, with food, for 5 days. Start the day after each chemotherapy, Disp: 120 tablet, Rfl: 0 .  pregabalin (LYRICA) 75 MG capsule, Take 75 mg by mouth 2 (two) times daily., Disp: , Rfl:  .  prochlorperazine (COMPAZINE) 10 MG tablet, Take 1 tablet (10 mg total) by mouth every 6 (six) hours as needed (Nausea or vomiting)., Disp: 30 tablet, Rfl: 6 .  saxagliptin HCl (ONGLYZA) 5 MG TABS tablet, Take 1 tablet (5 mg total) by mouth daily., Disp: 30 tablet, Rfl: 2 .  traMADol (ULTRAM) 50 MG tablet, , Disp: , Rfl:   Allergies:  Allergies  Allergen Reactions  . Benazepril Swelling    angioedema; he is not a candidate for any angiotensin receptor blockers because of this significant allergic reaction. Because of a history of documented adverse serious drug reaction;Medi Alert bracelet  is recommended  . Hctz [Hydrochlorothiazide] Anaphylaxis and Swelling    Tongue and lip swelling   . Aspirin Other (See Comments)    Gastritis, cant take 325 Mg aspirin   . Lactose Intolerance (Gi) Nausea And Vomiting    Past Medical History, Surgical history, Social history, and Family History were reviewed and updated.  Review of Systems: Review of Systems  Constitutional: Negative for appetite change, fatigue, fever and unexpected weight change.  HENT:   Negative for lump/mass, mouth sores, sore throat and trouble swallowing.   Respiratory: Negative for cough, hemoptysis and shortness of breath.   Cardiovascular: Negative for leg swelling and palpitations.  Gastrointestinal: Negative for abdominal distention, abdominal pain, blood in stool, constipation, diarrhea, nausea and vomiting.  Genitourinary: Negative for bladder incontinence, dysuria, frequency and hematuria.   Musculoskeletal: Negative for arthralgias, gait problem and myalgias.  Skin: Negative for itching and rash.    Neurological: Negative for dizziness, extremity weakness, gait problem, headaches, numbness, seizures and speech difficulty.  Hematological: Does not bruise/bleed easily.  Psychiatric/Behavioral: Negative for depression and sleep disturbance. The patient is not nervous/anxious.     Physical Exam:  weight is 250 lb (113.4 kg). His oral temperature is 98.6 F (37 C). His blood pressure is 150/76 (abnormal) and his pulse is 91. His respiration is 18 and oxygen saturation is 91%.   Wt Readings from Last 3 Encounters:  03/28/17 250 lb (113.4 kg)  03/20/17 251 lb (113.9 kg)  03/06/17 254 lb (115.2 kg)    Physical Exam  Constitutional: He is oriented to person, place, and time.  HENT:  Head: Normocephalic and atraumatic.  Mouth/Throat: Oropharynx is clear and moist.  Eyes: Pupils are equal, round, and reactive to light. EOM are normal.  Neck: Normal range  of motion.  Cardiovascular: Normal rate, regular rhythm and normal heart sounds.   Pulmonary/Chest: Effort normal and breath sounds normal.  Abdominal: Soft. Bowel sounds are normal.  Musculoskeletal: Normal range of motion. He exhibits no edema, tenderness or deformity.  Lymphadenopathy:    He has no cervical adenopathy.  Neurological: He is alert and oriented to person, place, and time.  Skin: Skin is warm and dry. No rash noted. No erythema.  Psychiatric: He has a normal mood and affect. His behavior is normal. Judgment and thought content normal.  Vitals reviewed.    Lab Results  Component Value Date   WBC 6.3 03/20/2017   HGB 12.3 (L) 03/20/2017   HCT 37.4 (L) 03/20/2017   MCV 87 03/20/2017   PLT 175 03/20/2017     Chemistry      Component Value Date/Time   NA 141 03/20/2017 1504   K 3.5 03/20/2017 1504   CL 101 03/20/2017 1504   CO2 29 03/20/2017 1504   BUN 10 03/20/2017 1504   CREATININE 1.0 03/20/2017 1504      Component Value Date/Time   CALCIUM 9.5 03/20/2017 1504   ALKPHOS 122 (H) 03/20/2017 1504   AST  22 03/20/2017 1504   ALT 27 03/20/2017 1504   BILITOT 0.70 03/20/2017 1504      Impression and Plan: Mr. Mogg is a 76 year old white male. He has diffuse large B-cell lymphoma. He does not have a double hit lymphoma.  We will proceed with his second cycle of treatment. At this, we will then repeat his PET scan.  I have to believe that the PET scan will show that he has responded beautifully.  We will see him back in 3 weeks.   [name], MD 9/28/20188:38 AM

## 2017-03-28 NOTE — Patient Instructions (Signed)
[facility] Discharge Instructions for Patients Receiving Chemotherapy  Today you received the following chemotherapy agents Cytoxan, Adriamycin, Vincristine, Rituxan.  To help prevent nausea and vomiting after your treatment, we encourage you to take your nausea medication    If you develop nausea and vomiting that is not controlled by your nausea medication, call the clinic.   BELOW ARE SYMPTOMS THAT SHOULD BE REPORTED IMMEDIATELY:  *FEVER GREATER THAN 100.5 F  *CHILLS WITH OR WITHOUT FEVER  NAUSEA AND VOMITING THAT IS NOT CONTROLLED WITH YOUR NAUSEA MEDICATION  *UNUSUAL SHORTNESS OF BREATH  *UNUSUAL BRUISING OR BLEEDING  TENDERNESS IN MOUTH AND THROAT WITH OR WITHOUT PRESENCE OF ULCERS  *URINARY PROBLEMS  *BOWEL PROBLEMS  UNUSUAL RASH Items with * indicate a potential emergency and should be followed up as soon as possible.  Feel free to call the clinic should you have any questions or concerns. The clinic phone number is [phone].  Please show the [facility] at check-in to the Emergency Department and triage nurse.

## 2017-03-28 NOTE — Patient Instructions (Signed)
Implanted Port Insertion, Care After °This sheet gives you information about how to care for yourself after your procedure. Your health care provider may also give you more specific instructions. If you have problems or questions, contact your health care provider. °What can I expect after the procedure? °After your procedure, it is common to have: °· Discomfort at the port insertion site. °· Bruising on the skin over the port. This should improve over 3-4 days. ° °Follow these instructions at home: °Port care °· After your port is placed, you will get a manufacturer's information card. The card has information about your port. Keep this card with you at all times. °· Take care of the port as told by your health care provider. Ask your health care provider if you or a family member can get training for taking care of the port at home. A home health care nurse may also take care of the port. °· Make sure to remember what type of port you have. °Incision care °· Follow instructions from your health care provider about how to take care of your port insertion site. Make sure you: °? Wash your hands with soap and water before you change your bandage (dressing). If soap and water are not available, use hand sanitizer. °? Change your dressing as told by your health care provider. °? Leave stitches (sutures), skin glue, or adhesive strips in place. These skin closures may need to stay in place for 2 weeks or longer. If adhesive strip edges start to loosen and curl up, you may trim the loose edges. Do not remove adhesive strips completely unless your health care provider tells you to do that. °· Check your port insertion site every day for signs of infection. Check for: °? More redness, swelling, or pain. °? More fluid or blood. °? Warmth. °? Pus or a bad smell. °General instructions °· Do not take baths, swim, or use a hot tub until your health care provider approves. °· Do not lift anything that is heavier than 10 lb (4.5  kg) for a week, or as told by your health care provider. °· Ask your health care provider when it is okay to: °? Return to work or school. °? Resume usual physical activities or sports. °· Do not drive for 24 hours if you were given a medicine to help you relax (sedative). °· Take over-the-counter and prescription medicines only as told by your health care provider. °· Wear a medical alert bracelet in case of an emergency. This will tell any health care providers that you have a port. °· Keep all follow-up visits as told by your health care provider. This is important. °Contact a health care provider if: °· You cannot flush your port with saline as directed, or you cannot draw blood from the port. °· You have a fever or chills. °· You have more redness, swelling, or pain around your port insertion site. °· You have more fluid or blood coming from your port insertion site. °· Your port insertion site feels warm to the touch. °· You have pus or a bad smell coming from the port insertion site. °Get help right away if: °· You have chest pain or shortness of breath. °· You have bleeding from your port that you cannot control. °Summary °· Take care of the port as told by your health care provider. °· Change your dressing as told by your health care provider. °· Keep all follow-up visits as told by your health care provider. °  This information is not intended to replace advice given to you by your health care provider. Make sure you discuss any questions you have with your health care provider. °Document Released: 04/07/2013 Document Revised: 05/08/2016 Document Reviewed: 05/08/2016 °Elsevier Interactive Patient Education © 2017 Elsevier Inc. ° °

## 2017-04-02 ENCOUNTER — Telehealth: Payer: Self-pay | Admitting: Family Medicine

## 2017-04-02 ENCOUNTER — Other Ambulatory Visit: Payer: Self-pay | Admitting: Family Medicine

## 2017-04-02 NOTE — Telephone Encounter (Signed)
YL-Plz see note below/thx dmf

## 2017-04-02 NOTE — Telephone Encounter (Signed)
The patient is wanting to talk with his provider about his blood sugar. He has been taking chemo for 3 weeks now and he has to take Predizone for 5 days after chemo and his blood sugar is all over the place. His blood sugar was 70 this morning so he didn't take his medication because he didn't need it to drop any lower. The patient would like to know if he needs to make an appointment to see his provided and see if he needs to adjust any medication.  Please Advise

## 2017-04-02 NOTE — Telephone Encounter (Signed)
Please schedule appt with Dr. Etter Sjogren to discuss. TY.

## 2017-04-03 ENCOUNTER — Encounter: Payer: Self-pay | Admitting: Family Medicine

## 2017-04-03 ENCOUNTER — Encounter (HOSPITAL_COMMUNITY): Payer: Self-pay

## 2017-04-03 ENCOUNTER — Ambulatory Visit (INDEPENDENT_AMBULATORY_CARE_PROVIDER_SITE_OTHER): Payer: Medicare Other | Admitting: Family Medicine

## 2017-04-03 VITALS — BP 122/58 | HR 78 | Temp 98.0°F | Ht 72.0 in | Wt 258.0 lb

## 2017-04-03 DIAGNOSIS — E1165 Type 2 diabetes mellitus with hyperglycemia: Secondary | ICD-10-CM

## 2017-04-03 DIAGNOSIS — I6529 Occlusion and stenosis of unspecified carotid artery: Secondary | ICD-10-CM

## 2017-04-03 DIAGNOSIS — I10 Essential (primary) hypertension: Secondary | ICD-10-CM

## 2017-04-03 DIAGNOSIS — E785 Hyperlipidemia, unspecified: Secondary | ICD-10-CM

## 2017-04-03 DIAGNOSIS — E1140 Type 2 diabetes mellitus with diabetic neuropathy, unspecified: Secondary | ICD-10-CM

## 2017-04-03 DIAGNOSIS — E1151 Type 2 diabetes mellitus with diabetic peripheral angiopathy without gangrene: Secondary | ICD-10-CM | POA: Diagnosis not present

## 2017-04-03 DIAGNOSIS — IMO0002 Reserved for concepts with insufficient information to code with codable children: Secondary | ICD-10-CM

## 2017-04-03 MED ORDER — FREESTYLE LANCETS MISC: 6 refills | Status: DC

## 2017-04-03 MED ORDER — GLUCOSE BLOOD VI STRP: ORAL_STRIP | 11 refills | Status: DC

## 2017-04-03 MED ORDER — GLIMEPIRIDE 2 MG PO TABS: ORAL_TABLET | ORAL | 2 refills | Status: DC

## 2017-04-03 MED ORDER — PREGABALIN 75 MG PO CAPS: 75.0000 mg | ORAL_CAPSULE | Freq: Three times a day (TID) | ORAL | 3 refills | Status: DC

## 2017-04-03 NOTE — Progress Notes (Signed)
Patient ID: Richard [name], male    DOB: [birth year]  Age: 76 y.o. MRN: [id number]    Subjective:  Subjective  HPI Richard [name] presents for f/u--  He has started chemo for lymphoma.  He is feeling much better since finding the cancer.  He states glucose readings have been running low.     Review of Systems  Constitutional: Negative for appetite change, diaphoresis, fatigue and unexpected weight change.  Eyes: Negative for pain, redness and visual disturbance.  Respiratory: Negative for cough, chest tightness, shortness of breath and wheezing.   Cardiovascular: Negative for chest pain, palpitations and leg swelling.  Endocrine: Negative for cold intolerance, heat intolerance, polydipsia, polyphagia and polyuria.  Genitourinary: Negative for difficulty urinating, dysuria and frequency.  Neurological: Negative for dizziness, light-headedness, numbness and headaches.    History Past Medical History:  Diagnosis Date  . Adenomatous colon polyp 05/01/2015   tubular  . Anemia   . Antritis (stomach)    mild  . Anxiety   . Aortic stenosis   . Arthritis    "knees, hips, shoulders" (03/15/2014)  . Axillary adenopathy 02/25/2017  . CAD (coronary artery disease)    bypass 2001  . Chronic lower back pain   . Diverticulosis    mild, left colon  . Duodenitis    chronic  . Esophageal stricture    X 4  . GERD (gastroesophageal reflux disease)   . Gout   . Heart murmur    Echo in july 2018  . History of gout   . HNP (herniated nucleus pulposus), lumbar   . HTN (hypertension)   . Iron deficiency   . Lymphoma ([place])   . Migraines    "til age 66 yrs"  . Mixed hyperlipidemia   . OSA on CPAP    with 2L O2 at night  . Peptic stricture of esophagus   . Skin cancer    "right neck cut off; several frozen off my arms" (03/15/2014)  . Sliding hiatal hernia   . Spinal stenosis   . Type II diabetes mellitus ([place])   . Vitamin B12 deficiency   . Wenckebach block    a. brady down to 30s due to  frequent block, metoprolol held in response.    He has a past surgical history that includes Vasectomy; Coronary artery bypass graft (03/2000); Esophageal dilation (X 3-4); Esophagogastroduodenoscopy; Flexible sigmoidoscopy; Panendoscopy; Knee arthroscopy (Left, 2011); Shoulder surgery (Right, 08/2010); Colonoscopy w/ biopsies and polypectomy (2013); Upper gi endoscopy (2013); Myelogram (04/06/13); Lumbar laminectomy/decompression microdiscectomy (Right, 06/17/2013); Back surgery; Appendectomy (~ 1952); Tonsillectomy (~ 1954); Coronary angioplasty (1993); Coronary angioplasty with stent (05/1997); Skin cancer excision (Right); left heart catheterization with coronary/graft angiogram (N/A, 03/16/2014); Cataract extraction w/PHACO (Left, 11/06/2016); Cataract extraction w/PHACO (Right, 12/04/2016); Hydradenitis excision (Left, 02/25/2017); and Portacath placement (N/A, 03/06/2017).   His family history includes Diabetes in his maternal grandmother; Heart attack in his paternal grandmother; Hypertension in his father; Pancreatic cancer in his mother; Stroke in his father and maternal grandmother.He reports that he has never smoked. He has never used smokeless tobacco. He reports that he does not drink alcohol or use drugs.  Current Outpatient Prescriptions on File Prior to Visit  Medication Sig Dispense Refill  . allopurinol (ZYLOPRIM) 300 MG tablet Take 450 mg by mouth daily.     [name] amLODipine (NORVASC) 5 MG tablet TAKE 2 TABLETS (10 MG TOTAL) BY MOUTH DAILY. (Patient taking differently: TAKE 1 TABLETS BY MOUTH TWICE DAILY.) 60 tablet 11  . aspirin  81 MG tablet Take 81 mg by mouth daily.    [name] atorvastatin (LIPITOR) 80 MG tablet TAKE ONE TABLET BY MOUTH AT BEDTIME 90 tablet 3  . esomeprazole (NEXIUM) 40 MG capsule Take 1 capsule (40 mg total) by mouth daily. 30 capsule 5  . ezetimibe (ZETIA) 10 MG tablet TAKE ONE TABLET BY MOUTH DAILY 90 tablet 3  . famciclovir (FAMVIR) 500 MG tablet Take 1 tablet (500 mg total) by  mouth daily. 30 tablet 12  . fenofibrate 160 MG tablet TAKE 1 TABLET BY MOUTH EVERY DAY 30 tablet 5  . fluticasone (FLONASE) 50 MCG/ACT nasal spray INHALE ONE SPRAY INTO EACH NOSTRIL TWICE DAILY. (Patient taking differently: INHALE ONE SPRAY INTO EACH NOSTRIL TWICE DAILY AS NEEDED FOR ALLERGIES) 1 g 6  . furosemide (LASIX) 40 MG tablet TAKE 2 TABLETS (80 MG TOTAL) BY MOUTH 2 (TWO) TIMES DAILY. 360 tablet 0  . glucose 5 g chewable tablet Chew 15 g by mouth as needed for low blood sugar.    . KLOR-CON M20 20 MEQ tablet TAKE ONE TABLET BY MOUTH TWICE DAILY 60 tablet 11  . lidocaine-prilocaine (EMLA) cream Apply to affected area once 30 g 3  . LORazepam (ATIVAN) 0.5 MG tablet Take 1 tablet (0.5 mg total) by mouth every 6 (six) hours as needed (Nausea or vomiting). 30 tablet 0  . Menthol, Topical Analgesic, (ICY HOT EX) Apply 1 application topically daily as needed (muscle pain).    . metFORMIN (GLUCOPHAGE) 1000 MG tablet TAKE ONE AND ONE-HALF TABLETS BY MOUTH EVERY MORNING AND 1 TALBET IN THE EVENING. 225 tablet 1  . metoprolol tartrate (LOPRESSOR) 25 MG tablet TAKE ONE-HALF TABLET BY MOUTH TWICE DAILY (Patient taking differently: TAKE ONE-HALF TABLET BY MOUTH IN THE MORNING AND HALF TABLET IN THE EVENING) 90 tablet 3  . montelukast (SINGULAIR) 10 MG tablet Take 1 tablet (10 mg total) by mouth at bedtime. 90 tablet 1  . nitroGLYCERIN (NITROSTAT) 0.4 MG SL tablet Place 1 tablet (0.4 mg total) under the tongue every 5 (five) minutes as needed for chest pain. 25 tablet 3  . ondansetron (ZOFRAN) 8 MG tablet Take 1 tablet (8 mg total) by mouth 2 (two) times daily as needed for refractory nausea / vomiting. Start on day 3 after cyclophosphamide chemotherapy. 30 tablet 1  . polycarbophil (FIBERCON) 625 MG tablet Take 625 mg by mouth daily.    . predniSONE (DELTASONE) 20 MG tablet Take 3 pills a day, with food, for 5 days. Start the day after each chemotherapy 120 tablet 0  . prochlorperazine (COMPAZINE) 10 MG  tablet Take 1 tablet (10 mg total) by mouth every 6 (six) hours as needed (Nausea or vomiting). 30 tablet 6   No current facility-administered medications on file prior to visit.      Objective:  Objective  Physical Exam  Constitutional: He is oriented to person, place, and time. Vital signs are normal. He appears well-developed and well-nourished. He is sleeping.  HENT:  Head: Normocephalic and atraumatic.  Mouth/Throat: Oropharynx is clear and moist.  Eyes: Pupils are equal, round, and reactive to light. EOM are normal.  Neck: Normal range of motion. Neck supple. No thyromegaly present.  Cardiovascular: Normal rate and regular rhythm.   Murmur heard. Pulmonary/Chest: Effort normal and breath sounds normal. No respiratory distress. He has no wheezes. He has no rales. He exhibits no tenderness.  Musculoskeletal: He exhibits no edema or tenderness.  Neurological: He is alert and oriented to person, place, and time.  Skin: Skin is warm and dry.  Psychiatric: He has a normal mood and affect. His behavior is normal. Judgment and thought content normal.   BP (!) 122/58 (BP Location: Right Arm, Patient Position: Sitting, Cuff Size: Large)   Pulse 78   Temp 98 F (36.7 C) (Oral)   Ht 6' (1.829 m)   Wt 258 lb (117 kg)   SpO2 94%   BMI 34.99 kg/m  Wt Readings from Last 3 Encounters:  04/03/17 258 lb (117 kg)  03/28/17 250 lb (113.4 kg)  03/20/17 251 lb (113.9 kg)     Lab Results  Component Value Date   WBC 7.7 03/28/2017   HGB 12.4 (L) 03/28/2017   HCT 38.1 (L) 03/28/2017   PLT 289 03/28/2017   GLUCOSE 132 (H) 03/28/2017   CHOL 125 11/21/2016   TRIG 195.0 (H) 11/21/2016   HDL 29.20 (L) 11/21/2016   LDLDIRECT 71.0 09/18/2015   LDLCALC 57 11/21/2016   ALT 27 03/28/2017   AST 25 03/28/2017   NA 142 03/28/2017   K 3.5 03/28/2017   CL 104 03/28/2017   CREATININE 0.9 03/28/2017   BUN 11 03/28/2017   CO2 30 03/28/2017   TSH 2.53 07/28/2015   PSA 0.65 09/27/2013   INR 1.00  03/12/2017   HGBA1C 8.7 (H) 02/18/2017   MICROALBUR 0.4 03/15/2016    Ct Biopsy  Result Date: 03/12/2017 INDICATION: Non-Hodgkin's lymphoma EXAM: CT GUIDED RIGHT ILIAC BONE MARROW ASPIRATION AND CORE BIOPSY Date:  9/12/20189/05/2017 10:00 am Radiologist:  M. Daryll Brod, MD Guidance:  CT FLUOROSCOPY TIME:  Fluoroscopy Time: None. MEDICATIONS: None. ANESTHESIA/SEDATION: 2.0 mg IV Versed; 100 mcg IV Fentanyl Moderate Sedation Time:  10 minutes The patient was continuously monitored during the procedure by the interventional radiology nurse under my direct supervision. CONTRAST:  None. COMPLICATIONS: None PROCEDURE: Informed consent was obtained from the patient following explanation of the procedure, risks, benefits and alternatives. The patient understands, agrees and consents for the procedure. All questions were addressed. A time out was performed. The patient was positioned prone and non-contrast localization CT was performed of the pelvis to demonstrate the iliac marrow spaces. Maximal barrier sterile technique utilized including caps, mask, sterile gowns, sterile gloves, large sterile drape, hand hygiene, and Betadine prep. Under sterile conditions and local anesthesia, an 11 gauge coaxial bone biopsy needle was advanced into the right iliac marrow space. Needle position was confirmed with CT imaging. Initially, bone marrow aspiration was performed. Next, the 11 gauge outer cannula was utilized to obtain a right iliac bone marrow core biopsy. Needle was removed. Hemostasis was obtained with compression. The patient tolerated the procedure well. Samples were prepared with the cytotechnologist. No immediate complications. IMPRESSION: CT guided right iliac bone marrow aspiration and core biopsy. Electronically Signed   By: Jerilynn Mages.  Shick M.D.   On: 03/12/2017 10:13   Ct Bone Marrow Biopsy & Aspiration  Result Date: 03/12/2017 INDICATION: Non-Hodgkin's lymphoma EXAM: CT GUIDED RIGHT ILIAC BONE MARROW  ASPIRATION AND CORE BIOPSY Date:  9/12/20189/05/2017 10:00 am Radiologist:  M. Daryll Brod, MD Guidance:  CT FLUOROSCOPY TIME:  Fluoroscopy Time: None. MEDICATIONS: None. ANESTHESIA/SEDATION: 2.0 mg IV Versed; 100 mcg IV Fentanyl Moderate Sedation Time:  10 minutes The patient was continuously monitored during the procedure by the interventional radiology nurse under my direct supervision. CONTRAST:  None. COMPLICATIONS: None PROCEDURE: Informed consent was obtained from the patient following explanation of the procedure, risks, benefits and alternatives. The patient understands, agrees and consents for the procedure. All questions  were addressed. A time out was performed. The patient was positioned prone and non-contrast localization CT was performed of the pelvis to demonstrate the iliac marrow spaces. Maximal barrier sterile technique utilized including caps, mask, sterile gowns, sterile gloves, large sterile drape, hand hygiene, and Betadine prep. Under sterile conditions and local anesthesia, an 11 gauge coaxial bone biopsy needle was advanced into the right iliac marrow space. Needle position was confirmed with CT imaging. Initially, bone marrow aspiration was performed. Next, the 11 gauge outer cannula was utilized to obtain a right iliac bone marrow core biopsy. Needle was removed. Hemostasis was obtained with compression. The patient tolerated the procedure well. Samples were prepared with the cytotechnologist. No immediate complications. IMPRESSION: CT guided right iliac bone marrow aspiration and core biopsy. Electronically Signed   By: Jerilynn Mages.  Shick M.D.   On: 03/12/2017 10:13     Assessment & Plan:  Plan  I have discontinued Mr. Balan's saxagliptin HCl, traMADol, and HYDROcodone-acetaminophen. I have changed his FREESTYLE LITE to glucose blood. I have also changed his glimepiride, freestyle, and pregabalin. Additionally, I am having him maintain his allopurinol, esomeprazole, aspirin, montelukast,  fluticasone, fenofibrate, nitroGLYCERIN, furosemide, metoprolol tartrate, ezetimibe, amLODipine, KLOR-CON M20, atorvastatin, polycarbophil, (Menthol, Topical Analgesic, (ICY HOT EX)), metFORMIN, famciclovir, predniSONE, lidocaine-prilocaine, ondansetron, prochlorperazine, LORazepam, and glucose.  Meds ordered this encounter  Medications  . glimepiride (AMARYL) 2 MG tablet    Sig: 1/2 tab po qd    Dispense:  45 tablet    Refill:  2  . Lancets (FREESTYLE) lancets    Sig: Use BID to check blood sugar.  DX E11.9    Dispense:  100 each    Refill:  6  . glucose blood (FREESTYLE LITE) test strip    Sig: TEST BLOOD SUGAR bid    Dispense:  100 each    Refill:  11    DX E11.69 & E66.9  . pregabalin (LYRICA) 75 MG capsule    Sig: Take 1 capsule (75 mg total) by mouth 3 (three) times daily.    Dispense:  270 capsule    Refill:  3    Problem List Items Addressed This Visit      Unprioritized   Essential hypertension    Well controlled, no changes to meds. Encouraged heart healthy diet such as the DASH diet and exercise as tolerated.       Hyperlipidemia LDL goal <70    Tolerating statin, encouraged heart healthy diet, avoid trans fats, minimize simple carbs and saturated fats. Increase exercise as tolerated      Type 2 diabetes mellitus with diabetic neuropathy, without long-term current use of insulin (HCC) - Primary    hgba1c slightly high last check, minimize simple carbs. Increase exercise as tolerated. Continue current meds Lab Results  Component Value Date   HGBA1C 8.7 (H) 02/18/2017  since starting tx glucose readings have been low--  Cut amaryl in half, con't metformin and call us with readings        Relevant Medications   glimepiride (AMARYL) 2 MG tablet   Lancets (FREESTYLE) lancets   glucose blood (FREESTYLE LITE) test strip   pregabalin (LYRICA) 75 MG capsule      Follow-up: Return in about 3 months (around 07/04/2017), or if symptoms worsen or fail to improve, for  hypertension, hyperlipidemia, diabetes II.  [name], DO

## 2017-04-03 NOTE — Telephone Encounter (Signed)
Patient was seen this am/thx dmf

## 2017-04-03 NOTE — Patient Instructions (Signed)
Carbohydrate Counting for Diabetes Mellitus, Adult Carbohydrate counting is a method for keeping track of how many carbohydrates you eat. Eating carbohydrates naturally increases the amount of sugar (glucose) in the blood. Counting how many carbohydrates you eat helps keep your blood glucose within normal limits, which helps you manage your diabetes (diabetes mellitus). It is important to know how many carbohydrates you can safely have in each meal. This is different for every person. A diet and nutrition specialist (registered dietitian) can help you make a meal plan and calculate how many carbohydrates you should have at each meal and snack. Carbohydrates are found in the following foods:  Grains, such as breads and cereals.  Dried beans and soy products.  Starchy vegetables, such as potatoes, peas, and corn.  Fruit and fruit juices.  Milk and yogurt.  Sweets and snack foods, such as cake, cookies, candy, chips, and soft drinks.  How do I count carbohydrates? There are two ways to count carbohydrates in food. You can use either of the methods or a combination of both. Reading "Nutrition Facts" on packaged food The "Nutrition Facts" list is included on the labels of almost all packaged foods and beverages in the [place] It includes:  The serving size.  Information about nutrients in each serving, including the grams (g) of carbohydrate per serving.  To use the "Nutrition Facts":  Decide how many servings you will have.  Multiply the number of servings by the number of carbohydrates per serving.  The resulting number is the total amount of carbohydrates that you will be having.  Learning standard serving sizes of other foods When you eat foods containing carbohydrates that are not packaged or do not include "Nutrition Facts" on the label, you need to measure the servings in order to count the amount of carbohydrates:  Measure the foods that you will eat with a food scale or  measuring cup, if needed.  Decide how many standard-size servings you will eat.  Multiply the number of servings by 15. Most carbohydrate-rich foods have about 15 g of carbohydrates per serving. ? For example, if you eat 8 oz (170 g) of strawberries, you will have eaten 2 servings and 30 g of carbohydrates (2 servings x 15 g = 30 g).  For foods that have more than one food mixed, such as soups and casseroles, you must count the carbohydrates in each food that is included.  The following list contains standard serving sizes of common carbohydrate-rich foods. Each of these servings has about 15 g of carbohydrates:   hamburger bun or  English muffin.   oz (15 mL) syrup.   oz (14 g) jelly.  1 slice of bread.  1 six-inch tortilla.  3 oz (85 g) cooked rice or pasta.  4 oz (113 g) cooked dried beans.  4 oz (113 g) starchy vegetable, such as peas, corn, or potatoes.  4 oz (113 g) hot cereal.  4 oz (113 g) mashed potatoes or  of a large baked potato.  4 oz (113 g) canned or frozen fruit.  4 oz (120 mL) fruit juice.  4-6 crackers.  6 chicken nuggets.  6 oz (170 g) unsweetened dry cereal.  6 oz (170 g) plain fat-free yogurt or yogurt sweetened with artificial sweeteners.  8 oz (240 mL) milk.  8 oz (170 g) fresh fruit or one small piece of fruit.  24 oz (680 g) popped popcorn.  Example of carbohydrate counting Sample meal  3 oz (85 g) chicken breast.    6 oz (170 g) brown rice.  4 oz (113 g) corn.  8 oz (240 mL) milk.  8 oz (170 g) strawberries with sugar-free whipped topping. Carbohydrate calculation 1. Identify the foods that contain carbohydrates: ? Rice. ? Corn. ? Milk. ? Strawberries. 2. Calculate how many servings you have of each food: ? 2 servings rice. ? 1 serving corn. ? 1 serving milk. ? 1 serving strawberries. 3. Multiply each number of servings by 15 g: ? 2 servings rice x 15 g = 30 g. ? 1 serving corn x 15 g = 15 g. ? 1 serving milk x 15  g = 15 g. ? 1 serving strawberries x 15 g = 15 g. 4. Add together all of the amounts to find the total grams of carbohydrates eaten: ? 30 g + 15 g + 15 g + 15 g = 75 g of carbohydrates total. This information is not intended to replace advice given to you by your health care provider. Make sure you discuss any questions you have with your health care provider. Document Released: 06/17/2005 Document Revised: 01/05/2016 Document Reviewed: 11/29/2015 Elsevier Interactive Patient Education  2018 [employer]  

## 2017-04-03 NOTE — Assessment & Plan Note (Signed)
Tolerating statin, encouraged heart healthy diet, avoid trans fats, minimize simple carbs and saturated fats. Increase exercise as tolerated 

## 2017-04-03 NOTE — Assessment & Plan Note (Signed)
Well controlled, no changes to meds. Encouraged heart healthy diet such as the DASH diet and exercise as tolerated.  °

## 2017-04-03 NOTE — Assessment & Plan Note (Signed)
hgba1c slightly high last check, minimize simple carbs. Increase exercise as tolerated. Continue current meds Lab Results  Component Value Date   HGBA1C 8.7 (H) 02/18/2017  since starting tx glucose readings have been low--  Cut amaryl in half, con't metformin and call us with readings

## 2017-04-04 ENCOUNTER — Telehealth: Payer: Self-pay | Admitting: *Deleted

## 2017-04-04 LAB — CHROMOSOME ANALYSIS, BONE MARROW

## 2017-04-04 LAB — TISSUE HYBRIDIZATION TO NCBH

## 2017-04-04 MED ORDER — DIPHENOXYLATE-ATROPINE 2.5-0.025 MG PO TABS: ORAL_TABLET | ORAL | 0 refills | Status: DC

## 2017-04-04 NOTE — Telephone Encounter (Signed)
Patient is experiencing loose stools. He would like to know how to treat. Has been taking imodium without decease in stool.   Reviewed with Dr Marin Olp and he would like patient to take lomotil. Pharmacy confirmed. Patient is aware of new prescription.

## 2017-04-15 ENCOUNTER — Ambulatory Visit (HOSPITAL_COMMUNITY)
Admission: RE | Admit: 2017-04-15 | Discharge: 2017-04-15 | Disposition: A | Discharge status: Home / Self Care | Payer: Medicare Other | Source: Ambulatory Visit | Attending: Hematology & Oncology | Admitting: Hematology & Oncology

## 2017-04-15 DIAGNOSIS — C8332 Diffuse large B-cell lymphoma, intrathoracic lymph nodes: Secondary | ICD-10-CM

## 2017-04-15 DIAGNOSIS — C8330 Diffuse large B-cell lymphoma, unspecified site: Secondary | ICD-10-CM | POA: Diagnosis not present

## 2017-04-15 LAB — GLUCOSE, CAPILLARY: Glucose-Capillary: 163 mg/dL — ABNORMAL HIGH (ref 65–99)

## 2017-04-15 MED ORDER — FLUDEOXYGLUCOSE F - 18 (FDG) INJECTION
12.8000 | Freq: Once | INTRAVENOUS | Status: AC | PRN
  Administered 2017-04-15: 12.8 via INTRAVENOUS

## 2017-04-16 ENCOUNTER — Ambulatory Visit: Payer: Medicare Other

## 2017-04-16 ENCOUNTER — Ambulatory Visit (HOSPITAL_BASED_OUTPATIENT_CLINIC_OR_DEPARTMENT_OTHER): Payer: Medicare Other

## 2017-04-16 ENCOUNTER — Other Ambulatory Visit (HOSPITAL_BASED_OUTPATIENT_CLINIC_OR_DEPARTMENT_OTHER): Payer: Medicare Other

## 2017-04-16 ENCOUNTER — Ambulatory Visit (HOSPITAL_BASED_OUTPATIENT_CLINIC_OR_DEPARTMENT_OTHER): Payer: Medicare Other | Admitting: Hematology & Oncology

## 2017-04-16 VITALS — BP 147/76 | HR 87 | Temp 98.3°F | Resp 19 | Wt 255.1 lb

## 2017-04-16 DIAGNOSIS — D510 Vitamin B12 deficiency anemia due to intrinsic factor deficiency: Secondary | ICD-10-CM | POA: Diagnosis not present

## 2017-04-16 DIAGNOSIS — C8330 Diffuse large B-cell lymphoma, unspecified site: Secondary | ICD-10-CM

## 2017-04-16 DIAGNOSIS — C8332 Diffuse large B-cell lymphoma, intrathoracic lymph nodes: Secondary | ICD-10-CM

## 2017-04-16 LAB — CMP (CANCER CENTER ONLY)
ALK PHOS: 110 U/L — AB (ref 26–84)
ALT: 10 U/L (ref 10–47)
AST: 21 U/L (ref 11–38)
Albumin: 3.7 g/dL (ref 3.3–5.5)
BUN: 14 mg/dL (ref 7–22)
CALCIUM: 9.6 mg/dL (ref 8.0–10.3)
CO2: 27 mEq/L (ref 18–33)
Chloride: 104 mEq/L (ref 98–108)
Creat: 1 mg/dl (ref 0.6–1.2)
Glucose, Bld: 149 mg/dL — ABNORMAL HIGH (ref 73–118)
POTASSIUM: 3.4 meq/L (ref 3.3–4.7)
Sodium: 140 mEq/L (ref 128–145)
TOTAL PROTEIN: 6.7 g/dL (ref 6.4–8.1)
Total Bilirubin: 1 mg/dl (ref 0.20–1.60)

## 2017-04-16 LAB — CBC WITH DIFFERENTIAL (CANCER CENTER ONLY)
BASO#: 0.1 10*3/uL (ref 0.0–0.2)
BASO%: 0.6 % (ref 0.0–2.0)
EOS%: 0.6 % (ref 0.0–7.0)
Eosinophils Absolute: 0.1 10*3/uL (ref 0.0–0.5)
HCT: 36.6 % — ABNORMAL LOW (ref 38.7–49.9)
HEMOGLOBIN: 12 g/dL — AB (ref 13.0–17.1)
LYMPH#: 1.1 10*3/uL (ref 0.9–3.3)
LYMPH%: 13.2 % — ABNORMAL LOW (ref 14.0–48.0)
MCH: 29.2 pg (ref 28.0–33.4)
MCHC: 32.8 g/dL (ref 32.0–35.9)
MCV: 89 fL (ref 82–98)
MONO#: 0.8 10*3/uL (ref 0.1–0.9)
MONO%: 9.6 % (ref 0.0–13.0)
NEUT%: 76 % (ref 40.0–80.0)
NEUTROS ABS: 6.2 10*3/uL (ref 1.5–6.5)
Platelets: 245 10*3/uL (ref 145–400)
RBC: 4.11 10*6/uL — AB (ref 4.20–5.70)
RDW: 17.7 % — ABNORMAL HIGH (ref 11.1–15.7)
WBC: 8.1 10*3/uL (ref 4.0–10.0)

## 2017-04-16 MED ORDER — HEPARIN SOD (PORK) LOCK FLUSH 100 UNIT/ML IV SOLN
500.0000 [IU] | Freq: Once | INTRAVENOUS | Status: AC
  Administered 2017-04-16: 500 [IU] via INTRAVENOUS
  Filled 2017-04-16: qty 5

## 2017-04-16 MED ORDER — SODIUM CHLORIDE 0.9% FLUSH
10.0000 mL | INTRAVENOUS | Status: DC | PRN
  Administered 2017-04-16: 10 mL via INTRAVENOUS
  Filled 2017-04-16: qty 10

## 2017-04-16 MED ORDER — CYANOCOBALAMIN 1000 MCG/ML IJ SOLN
INTRAMUSCULAR | Status: AC
  Filled 2017-04-16: qty 1

## 2017-04-16 NOTE — Progress Notes (Signed)
Hematology and Oncology Follow Up Visit  ALYN JURNEY [id number] Jul 07, 1940 76 y.o. 04/16/2017   Principle Diagnosis:   Diffuse large cell non-Hodgkin's lymphoma (IPI = 3) - NOT "double hit"  Pernicious anemia   Current Therapy:   R-CHOP - s/p cycle # 2 Vitamin B12 1 mg IM every month     Interim History   Mr. Capistran comes back for follow-up. We did go ahead and repeat a PET scan on him. No surprise, the PET scan show that he has attained a complete metabolic response. There is no active abnormalities with lymph nodes or bone on his PET scan.  He comes in with his family. I reviewed the PET scan with them. I told him that he is in remission. He is not cured. However, this is a very good start for a cure.  He has had no problems with pain. He is not taking any pain medicine for about 3 weeks. In fact, when I called him about the PET scan result, he was out playing golf.  He has had no fever. He's had no problems with bowels or bladder. He's had no mouth sores.  He's had no cough.  Overall, says performance status is ECOG 1.  Medications:  Current Outpatient Prescriptions:  .  allopurinol (ZYLOPRIM) 300 MG tablet, Take 450 mg by mouth daily. , Disp: , Rfl:  .  amLODipine (NORVASC) 5 MG tablet, TAKE 2 TABLETS (10 MG TOTAL) BY MOUTH DAILY. (Patient taking differently: TAKE 1 TABLETS BY MOUTH TWICE DAILY.), Disp: 60 tablet, Rfl: 11 .  aspirin 81 MG tablet, Take 81 mg by mouth daily., Disp: , Rfl:  .  atorvastatin (LIPITOR) 80 MG tablet, TAKE ONE TABLET BY MOUTH AT BEDTIME, Disp: 90 tablet, Rfl: 3 .  diphenoxylate-atropine (LOMOTIL) 2.5-0.025 MG tablet, Take 2 pills at the onset of diarrhea, then take one pill after every loose stool. Maximum 8 pills per 24h., Disp: 60 tablet, Rfl: 0 .  esomeprazole (NEXIUM) 40 MG capsule, Take 1 capsule (40 mg total) by mouth daily., Disp: 30 capsule, Rfl: 5 .  ezetimibe (ZETIA) 10 MG tablet, TAKE ONE TABLET BY MOUTH DAILY, Disp: 90 tablet, Rfl:  3 .  famciclovir (FAMVIR) 500 MG tablet, Take 1 tablet (500 mg total) by mouth daily., Disp: 30 tablet, Rfl: 12 .  fenofibrate 160 MG tablet, TAKE 1 TABLET BY MOUTH EVERY DAY, Disp: 30 tablet, Rfl: 5 .  fluticasone (FLONASE) 50 MCG/ACT nasal spray, INHALE ONE SPRAY INTO EACH NOSTRIL TWICE DAILY. (Patient taking differently: INHALE ONE SPRAY INTO EACH NOSTRIL TWICE DAILY AS NEEDED FOR ALLERGIES), Disp: 1 g, Rfl: 6 .  furosemide (LASIX) 40 MG tablet, TAKE 2 TABLETS (80 MG TOTAL) BY MOUTH 2 (TWO) TIMES DAILY., Disp: 360 tablet, Rfl: 0 .  glimepiride (AMARYL) 2 MG tablet, 1/2 tab po qd, Disp: 45 tablet, Rfl: 2 .  glucose 5 g chewable tablet, Chew 15 g by mouth as needed for low blood sugar., Disp: , Rfl:  .  glucose blood (FREESTYLE LITE) test strip, TEST BLOOD SUGAR bid, Disp: 100 each, Rfl: 11 .  KLOR-CON M20 20 MEQ tablet, TAKE ONE TABLET BY MOUTH TWICE DAILY, Disp: 60 tablet, Rfl: 11 .  Lancets (FREESTYLE) lancets, Use BID to check blood sugar.  DX E11.9, Disp: 100 each, Rfl: 6 .  lidocaine-prilocaine (EMLA) cream, Apply to affected area once, Disp: 30 g, Rfl: 3 .  LORazepam (ATIVAN) 0.5 MG tablet, Take 1 tablet (0.5 mg total) by mouth every 6 (six)  hours as needed (Nausea or vomiting)., Disp: 30 tablet, Rfl: 0 .  Menthol, Topical Analgesic, (ICY HOT EX), Apply 1 application topically daily as needed (muscle pain)., Disp: , Rfl:  .  metFORMIN (GLUCOPHAGE) 1000 MG tablet, TAKE ONE AND ONE-HALF TABLETS BY MOUTH EVERY MORNING AND 1 TALBET IN THE EVENING., Disp: 225 tablet, Rfl: 1 .  metoprolol tartrate (LOPRESSOR) 25 MG tablet, TAKE ONE-HALF TABLET BY MOUTH TWICE DAILY (Patient taking differently: TAKE ONE-HALF TABLET BY MOUTH IN THE MORNING AND HALF TABLET IN THE EVENING), Disp: 90 tablet, Rfl: 3 .  montelukast (SINGULAIR) 10 MG tablet, Take 1 tablet (10 mg total) by mouth at bedtime., Disp: 90 tablet, Rfl: 1 .  nitroGLYCERIN (NITROSTAT) 0.4 MG SL tablet, Place 1 tablet (0.4 mg total) under the tongue  every 5 (five) minutes as needed for chest pain., Disp: 25 tablet, Rfl: 3 .  ondansetron (ZOFRAN) 8 MG tablet, Take 1 tablet (8 mg total) by mouth 2 (two) times daily as needed for refractory nausea / vomiting. Start on day 3 after cyclophosphamide chemotherapy., Disp: 30 tablet, Rfl: 1 .  polycarbophil (FIBERCON) 625 MG tablet, Take 625 mg by mouth daily., Disp: , Rfl:  .  predniSONE (DELTASONE) 20 MG tablet, Take 3 pills a day, with food, for 5 days. Start the day after each chemotherapy, Disp: 120 tablet, Rfl: 0 .  pregabalin (LYRICA) 75 MG capsule, Take 1 capsule (75 mg total) by mouth 3 (three) times daily., Disp: 270 capsule, Rfl: 3 .  prochlorperazine (COMPAZINE) 10 MG tablet, Take 1 tablet (10 mg total) by mouth every 6 (six) hours as needed (Nausea or vomiting)., Disp: 30 tablet, Rfl: 6 No current facility-administered medications for this visit.   Facility-Administered Medications Ordered in Other Visits:  .  sodium chloride flush (NS) 0.9 % injection 10 mL, 10 mL, Intravenous, PRN, [name], MD, 10 mL at 04/16/17 1502  Allergies:  Allergies  Allergen Reactions  . Benazepril Swelling    angioedema; he is not a candidate for any angiotensin receptor blockers because of this significant allergic reaction. Because of a history of documented adverse serious drug reaction;Medi Alert bracelet  is recommended  . Hctz [Hydrochlorothiazide] Anaphylaxis and Swelling    Tongue and lip swelling   . Aspirin Other (See Comments)    Gastritis, cant take 325 Mg aspirin   . Lactose Intolerance (Gi) Nausea And Vomiting    Past Medical History, Surgical history, Social history, and Family History were reviewed and updated.  Review of Systems: Review of Systems  Constitutional: Negative for appetite change, fatigue, fever and unexpected weight change.  HENT:   Negative for lump/mass, mouth sores, sore throat and trouble swallowing.   Respiratory: Negative for cough, hemoptysis and  shortness of breath.   Cardiovascular: Negative for leg swelling and palpitations.  Gastrointestinal: Negative for abdominal distention, abdominal pain, blood in stool, constipation, diarrhea, nausea and vomiting.  Genitourinary: Negative for bladder incontinence, dysuria, frequency and hematuria.   Musculoskeletal: Negative for arthralgias, gait problem and myalgias.  Skin: Negative for itching and rash.  Neurological: Negative for dizziness, extremity weakness, gait problem, headaches, numbness, seizures and speech difficulty.  Hematological: Does not bruise/bleed easily.  Psychiatric/Behavioral: Negative for depression and sleep disturbance. The patient is not nervous/anxious.     Physical Exam:  vitals were not taken for this visit.  Wt Readings from Last 3 Encounters:  04/16/17 255 lb 1.9 oz (115.7 kg)  04/03/17 258 lb (117 kg)  03/28/17 250 lb (113.4 kg)  Physical Exam  Constitutional: He is oriented to person, place, and time.  HENT:  Head: Normocephalic and atraumatic.  Mouth/Throat: Oropharynx is clear and moist.  Eyes: Pupils are equal, round, and reactive to light. EOM are normal.  Neck: Normal range of motion.  Cardiovascular: Normal rate, regular rhythm and normal heart sounds.   Pulmonary/Chest: Effort normal and breath sounds normal.  Abdominal: Soft. Bowel sounds are normal.  Musculoskeletal: Normal range of motion. He exhibits no edema, tenderness or deformity.  Lymphadenopathy:    He has no cervical adenopathy.  Neurological: He is alert and oriented to person, place, and time.  Skin: Skin is warm and dry. No rash noted. No erythema.  Psychiatric: He has a normal mood and affect. His behavior is normal. Judgment and thought content normal.  Vitals reviewed.    Lab Results  Component Value Date   WBC 8.1 04/16/2017   HGB 12.0 (L) 04/16/2017   HCT 36.6 (L) 04/16/2017   MCV 89 04/16/2017   PLT 245 04/16/2017     Chemistry      Component Value  Date/Time   NA 140 04/16/2017 1444   K 3.4 04/16/2017 1444   CL 104 04/16/2017 1444   CO2 27 04/16/2017 1444   BUN 14 04/16/2017 1444   CREATININE 1.0 04/16/2017 1444      Component Value Date/Time   CALCIUM 9.6 04/16/2017 1444   ALKPHOS 110 (H) 04/16/2017 1444   AST 21 04/16/2017 1444   ALT 10 04/16/2017 1444   BILITOT 1.00 04/16/2017 1444      Impression and Plan: Mr. Medinger is a 76 year old white male. He has diffuse large B-cell lymphoma. He does not have a double hit lymphoma.  Again, the PET scan clearly shows that we are on the right track. The R-CHOP is definitely working and is tolerated this well. I believe this is the best means for [place] to continue his therapy.  I will go ahead and plan for his third cycle of treatment on Friday.  I still feel that he needs 8 cycles of treatment. He has a IPI score of 3. This puts him at intermediate risk.  Again, I'm just happy and pleased that the PET scan showed a complete metabolic response.  We will plan to get him back in 3 weeks for his fourth cycle of treatment.    [name], MD 10/17/20184:56 PM

## 2017-04-16 NOTE — Patient Instructions (Signed)
Implanted Port Home Guide An implanted port is a type of central line that is placed under the skin. Central lines are used to provide IV access when treatment or nutrition needs to be given through a person's veins. Implanted ports are used for long-term IV access. An implanted port may be placed because:  You need IV medicine that would be irritating to the small veins in your hands or arms.  You need long-term IV medicines, such as antibiotics.  You need IV nutrition for a long period.  You need frequent blood draws for lab tests.  You need dialysis.  Implanted ports are usually placed in the chest area, but they can also be placed in the upper arm, the abdomen, or the leg. An implanted port has two main parts:  Reservoir. The reservoir is round and will appear as a small, raised area under your skin. The reservoir is the part where a needle is inserted to give medicines or draw blood.  Catheter. The catheter is a thin, flexible tube that extends from the reservoir. The catheter is placed into a large vein. Medicine that is inserted into the reservoir goes into the catheter and then into the vein.  How will I care for my incision site? Do not get the incision site wet. Bathe or shower as directed by your health care provider. How is my port accessed? Special steps must be taken to access the port:  Before the port is accessed, a numbing cream can be placed on the skin. This helps numb the skin over the port site.  Your health care provider uses a sterile technique to access the port. ? Your health care provider must put on a mask and sterile gloves. ? The skin over your port is cleaned carefully with an antiseptic and allowed to dry. ? The port is gently pinched between sterile gloves, and a needle is inserted into the port.  Only "non-coring" port needles should be used to access the port. Once the port is accessed, a blood return should be checked. This helps ensure that the port  is in the vein and is not clogged.  If your port needs to remain accessed for a constant infusion, a clear (transparent) bandage will be placed over the needle site. The bandage and needle will need to be changed every week, or as directed by your health care provider.  Keep the bandage covering the needle clean and dry. Do not get it wet. Follow your health care provider's instructions on how to take a shower or bath while the port is accessed.  If your port does not need to stay accessed, no bandage is needed over the port.  What is flushing? Flushing helps keep the port from getting clogged. Follow your health care provider's instructions on how and when to flush the port. Ports are usually flushed with saline solution or a medicine called heparin. The need for flushing will depend on how the port is used.  If the port is used for intermittent medicines or blood draws, the port will need to be flushed: ? After medicines have been given. ? After blood has been drawn. ? As part of routine maintenance.  If a constant infusion is running, the port may not need to be flushed.  How long will my port stay implanted? The port can stay in for as long as your health care provider thinks it is needed. When it is time for the port to come out, surgery will be   done to remove it. The procedure is similar to the one performed when the port was put in. When should I seek immediate medical care? When you have an implanted port, you should seek immediate medical care if:  You notice a bad smell coming from the incision site.  You have swelling, redness, or drainage at the incision site.  You have more swelling or pain at the port site or the surrounding area.  You have a fever that is not controlled with medicine.  This information is not intended to replace advice given to you by your health care provider. Make sure you discuss any questions you have with your health care provider. Document  Released: 06/17/2005 Document Revised: 11/23/2015 Document Reviewed: 02/22/2013 Elsevier Interactive Patient Education  2017 [employer]vier [employer].  

## 2017-04-17 LAB — LACTATE DEHYDROGENASE: LDH: 209 U/L (ref 125–245)

## 2017-04-18 ENCOUNTER — Other Ambulatory Visit: Payer: Medicare Other

## 2017-04-18 ENCOUNTER — Ambulatory Visit (HOSPITAL_BASED_OUTPATIENT_CLINIC_OR_DEPARTMENT_OTHER): Payer: Medicare Other

## 2017-04-18 VITALS — BP 127/52 | HR 60 | Temp 97.6°F | Resp 20

## 2017-04-18 DIAGNOSIS — Z5112 Encounter for antineoplastic immunotherapy: Secondary | ICD-10-CM

## 2017-04-18 DIAGNOSIS — D510 Vitamin B12 deficiency anemia due to intrinsic factor deficiency: Secondary | ICD-10-CM

## 2017-04-18 DIAGNOSIS — D508 Other iron deficiency anemias: Secondary | ICD-10-CM

## 2017-04-18 DIAGNOSIS — C8330 Diffuse large B-cell lymphoma, unspecified site: Secondary | ICD-10-CM | POA: Diagnosis not present

## 2017-04-18 DIAGNOSIS — Z5111 Encounter for antineoplastic chemotherapy: Secondary | ICD-10-CM

## 2017-04-18 DIAGNOSIS — C8332 Diffuse large B-cell lymphoma, intrathoracic lymph nodes: Secondary | ICD-10-CM

## 2017-04-18 DIAGNOSIS — D519 Vitamin B12 deficiency anemia, unspecified: Secondary | ICD-10-CM

## 2017-04-18 MED ORDER — DIPHENHYDRAMINE HCL 25 MG PO CAPS
50.0000 mg | ORAL_CAPSULE | Freq: Once | ORAL | Status: AC
  Administered 2017-04-18: 50 mg via ORAL

## 2017-04-18 MED ORDER — VINCRISTINE SULFATE CHEMO INJECTION 1 MG/ML
2.0000 mg | Freq: Once | INTRAVENOUS | Status: AC
  Administered 2017-04-18: 2 mg via INTRAVENOUS
  Filled 2017-04-18: qty 2

## 2017-04-18 MED ORDER — DOXORUBICIN HCL CHEMO IV INJECTION 2 MG/ML
50.0000 mg/m2 | Freq: Once | INTRAVENOUS | Status: AC
  Administered 2017-04-18: 122 mg via INTRAVENOUS
  Filled 2017-04-18: qty 61

## 2017-04-18 MED ORDER — DEXAMETHASONE SODIUM PHOSPHATE 10 MG/ML IJ SOLN
INTRAMUSCULAR | Status: AC
  Filled 2017-04-18: qty 1

## 2017-04-18 MED ORDER — HEPARIN SOD (PORK) LOCK FLUSH 100 UNIT/ML IV SOLN
500.0000 [IU] | Freq: Once | INTRAVENOUS | Status: AC | PRN
  Administered 2017-04-18: 500 [IU]
  Filled 2017-04-18: qty 5

## 2017-04-18 MED ORDER — SODIUM CHLORIDE 0.9 % IV SOLN
750.0000 mg/m2 | Freq: Once | INTRAVENOUS | Status: AC
  Administered 2017-04-18: 1820 mg via INTRAVENOUS
  Filled 2017-04-18: qty 91

## 2017-04-18 MED ORDER — CYANOCOBALAMIN 1000 MCG/ML IJ SOLN
1000.0000 ug | Freq: Once | INTRAMUSCULAR | Status: AC
  Administered 2017-04-18: 1000 ug via INTRAMUSCULAR

## 2017-04-18 MED ORDER — PALONOSETRON HCL INJECTION 0.25 MG/5ML
INTRAVENOUS | Status: AC
  Filled 2017-04-18: qty 5

## 2017-04-18 MED ORDER — SODIUM CHLORIDE 0.9 % IV SOLN
375.0000 mg/m2 | Freq: Once | INTRAVENOUS | Status: AC
  Administered 2017-04-18: 900 mg via INTRAVENOUS
  Filled 2017-04-18: qty 50

## 2017-04-18 MED ORDER — PALONOSETRON HCL INJECTION 0.25 MG/5ML
0.2500 mg | Freq: Once | INTRAVENOUS | Status: AC
  Administered 2017-04-18: 0.25 mg via INTRAVENOUS

## 2017-04-18 MED ORDER — PEGFILGRASTIM 6 MG/0.6ML ~~LOC~~ PSKT
6.0000 mg | PREFILLED_SYRINGE | Freq: Once | SUBCUTANEOUS | Status: AC
  Administered 2017-04-18: 6 mg via SUBCUTANEOUS

## 2017-04-18 MED ORDER — SODIUM CHLORIDE 0.9 % IV SOLN
Freq: Once | INTRAVENOUS | Status: AC
  Administered 2017-04-18: 08:00:00 via INTRAVENOUS

## 2017-04-18 MED ORDER — DEXAMETHASONE SODIUM PHOSPHATE 10 MG/ML IJ SOLN
10.0000 mg | Freq: Once | INTRAMUSCULAR | Status: AC
  Administered 2017-04-18: 10 mg via INTRAVENOUS

## 2017-04-18 MED ORDER — PEGFILGRASTIM 6 MG/0.6ML ~~LOC~~ PSKT
PREFILLED_SYRINGE | SUBCUTANEOUS | Status: AC
  Filled 2017-04-18: qty 0.6

## 2017-04-18 MED ORDER — ACETAMINOPHEN 325 MG PO TABS
650.0000 mg | ORAL_TABLET | Freq: Once | ORAL | Status: AC
  Administered 2017-04-18: 650 mg via ORAL

## 2017-04-18 MED ORDER — ACETAMINOPHEN 325 MG PO TABS
ORAL_TABLET | ORAL | Status: AC
  Filled 2017-04-18: qty 2

## 2017-04-18 MED ORDER — DIPHENHYDRAMINE HCL 25 MG PO CAPS
ORAL_CAPSULE | ORAL | Status: AC
  Filled 2017-04-18: qty 2

## 2017-04-18 MED ORDER — SODIUM CHLORIDE 0.9% FLUSH
10.0000 mL | INTRAVENOUS | Status: DC | PRN
  Administered 2017-04-18: 10 mL
  Filled 2017-04-18: qty 10

## 2017-04-18 MED ORDER — CYANOCOBALAMIN 1000 MCG/ML IJ SOLN
INTRAMUSCULAR | Status: AC
  Filled 2017-04-18: qty 1

## 2017-04-18 NOTE — Patient Instructions (Signed)
[facility] Discharge Instructions for Patients Receiving Chemotherapy  Today you received the following chemotherapy agents:  Adriamycin, Vincristine, Cytoxan and Rituxan  To help prevent nausea and vomiting after your treatment, we encourage you to take your nausea medication as ordered per MD.    If you develop nausea and vomiting that is not controlled by your nausea medication, call the clinic.   BELOW ARE SYMPTOMS THAT SHOULD BE REPORTED IMMEDIATELY:  *FEVER GREATER THAN 100.5 F  *CHILLS WITH OR WITHOUT FEVER  NAUSEA AND VOMITING THAT IS NOT CONTROLLED WITH YOUR NAUSEA MEDICATION  *UNUSUAL SHORTNESS OF BREATH  *UNUSUAL BRUISING OR BLEEDING  TENDERNESS IN MOUTH AND THROAT WITH OR WITHOUT PRESENCE OF ULCERS  *URINARY PROBLEMS  *BOWEL PROBLEMS  UNUSUAL RASH Items with * indicate a potential emergency and should be followed up as soon as possible.  Feel free to call the clinic should you have any questions or concerns. The clinic phone number is [phone].  Please show the [facility] at check-in to the Emergency Department and triage nurse.

## 2017-04-21 DIAGNOSIS — Z961 Presence of intraocular lens: Secondary | ICD-10-CM | POA: Diagnosis not present

## 2017-04-23 ENCOUNTER — Other Ambulatory Visit: Payer: Self-pay | Admitting: General Surgery

## 2017-04-23 ENCOUNTER — Other Ambulatory Visit: Payer: Self-pay | Admitting: *Deleted

## 2017-04-23 DIAGNOSIS — R599 Enlarged lymph nodes, unspecified: Secondary | ICD-10-CM

## 2017-04-23 DIAGNOSIS — I6529 Occlusion and stenosis of unspecified carotid artery: Secondary | ICD-10-CM

## 2017-04-23 DIAGNOSIS — R591 Generalized enlarged lymph nodes: Secondary | ICD-10-CM

## 2017-04-24 ENCOUNTER — Other Ambulatory Visit: Payer: Self-pay | Admitting: General Surgery

## 2017-04-24 DIAGNOSIS — R591 Generalized enlarged lymph nodes: Secondary | ICD-10-CM

## 2017-04-24 DIAGNOSIS — R599 Enlarged lymph nodes, unspecified: Secondary | ICD-10-CM

## 2017-04-30 ENCOUNTER — Ambulatory Visit
Admission: RE | Admit: 2017-04-30 | Discharge: 2017-04-30 | Disposition: A | Discharge status: Home / Self Care | Payer: Medicare Other | Source: Ambulatory Visit | Attending: General Surgery | Admitting: General Surgery

## 2017-04-30 ENCOUNTER — Other Ambulatory Visit: Payer: Self-pay | Admitting: General Surgery

## 2017-04-30 ENCOUNTER — Other Ambulatory Visit: Payer: Self-pay | Admitting: Orthopedic Surgery

## 2017-04-30 DIAGNOSIS — R599 Enlarged lymph nodes, unspecified: Secondary | ICD-10-CM

## 2017-04-30 DIAGNOSIS — R591 Generalized enlarged lymph nodes: Secondary | ICD-10-CM

## 2017-04-30 DIAGNOSIS — S32018A Other fracture of first lumbar vertebra, initial encounter for closed fracture: Secondary | ICD-10-CM | POA: Diagnosis not present

## 2017-04-30 DIAGNOSIS — M545 Low back pain, unspecified: Secondary | ICD-10-CM

## 2017-04-30 DIAGNOSIS — N611 Abscess of the breast and nipple: Secondary | ICD-10-CM | POA: Diagnosis not present

## 2017-04-30 DIAGNOSIS — N6489 Other specified disorders of breast: Secondary | ICD-10-CM | POA: Diagnosis not present

## 2017-04-30 DIAGNOSIS — R928 Other abnormal and inconclusive findings on diagnostic imaging of breast: Secondary | ICD-10-CM | POA: Diagnosis not present

## 2017-05-01 ENCOUNTER — Other Ambulatory Visit: Payer: Self-pay | Admitting: Family Medicine

## 2017-05-02 ENCOUNTER — Ambulatory Visit
Admission: RE | Admit: 2017-05-02 | Discharge: 2017-05-02 | Disposition: A | Discharge status: Home / Self Care | Payer: Medicare Other | Source: Ambulatory Visit | Attending: Orthopedic Surgery | Admitting: Orthopedic Surgery

## 2017-05-02 DIAGNOSIS — M545 Low back pain, unspecified: Secondary | ICD-10-CM

## 2017-05-02 DIAGNOSIS — M48061 Spinal stenosis, lumbar region without neurogenic claudication: Secondary | ICD-10-CM | POA: Diagnosis not present

## 2017-05-02 MED ORDER — GADOBENATE DIMEGLUMINE 529 MG/ML IV SOLN
20.0000 mL | Freq: Once | INTRAVENOUS | Status: AC | PRN
  Administered 2017-05-02: 20 mL via INTRAVENOUS

## 2017-05-06 DIAGNOSIS — M545 Low back pain: Secondary | ICD-10-CM | POA: Diagnosis not present

## 2017-05-09 ENCOUNTER — Ambulatory Visit (HOSPITAL_BASED_OUTPATIENT_CLINIC_OR_DEPARTMENT_OTHER): Payer: Medicare Other

## 2017-05-09 ENCOUNTER — Telehealth: Payer: Self-pay | Admitting: *Deleted

## 2017-05-09 ENCOUNTER — Ambulatory Visit: Payer: Medicare Other

## 2017-05-09 ENCOUNTER — Other Ambulatory Visit (HOSPITAL_BASED_OUTPATIENT_CLINIC_OR_DEPARTMENT_OTHER): Payer: Medicare Other

## 2017-05-09 ENCOUNTER — Encounter: Payer: Self-pay | Admitting: Hematology & Oncology

## 2017-05-09 ENCOUNTER — Ambulatory Visit (HOSPITAL_BASED_OUTPATIENT_CLINIC_OR_DEPARTMENT_OTHER): Payer: Medicare Other | Admitting: Hematology & Oncology

## 2017-05-09 ENCOUNTER — Other Ambulatory Visit: Payer: Self-pay

## 2017-05-09 VITALS — BP 112/57 | HR 72 | Temp 98.3°F | Resp 18

## 2017-05-09 VITALS — BP 135/62 | HR 74 | Temp 97.9°F | Resp 19 | Wt 257.0 lb

## 2017-05-09 DIAGNOSIS — C8332 Diffuse large B-cell lymphoma, intrathoracic lymph nodes: Secondary | ICD-10-CM

## 2017-05-09 DIAGNOSIS — D519 Vitamin B12 deficiency anemia, unspecified: Secondary | ICD-10-CM

## 2017-05-09 DIAGNOSIS — C8330 Diffuse large B-cell lymphoma, unspecified site: Secondary | ICD-10-CM

## 2017-05-09 DIAGNOSIS — Z5111 Encounter for antineoplastic chemotherapy: Secondary | ICD-10-CM

## 2017-05-09 DIAGNOSIS — D508 Other iron deficiency anemias: Secondary | ICD-10-CM

## 2017-05-09 DIAGNOSIS — Z5112 Encounter for antineoplastic immunotherapy: Secondary | ICD-10-CM

## 2017-05-09 LAB — CBC WITH DIFFERENTIAL (CANCER CENTER ONLY)
BASO#: 0.1 10*3/uL (ref 0.0–0.2)
BASO%: 0.8 % (ref 0.0–2.0)
EOS%: 2 % (ref 0.0–7.0)
Eosinophils Absolute: 0.1 10*3/uL (ref 0.0–0.5)
HCT: 35.7 % — ABNORMAL LOW (ref 38.7–49.9)
HEMOGLOBIN: 11.7 g/dL — AB (ref 13.0–17.1)
LYMPH#: 0.7 10*3/uL — ABNORMAL LOW (ref 0.9–3.3)
LYMPH%: 11.1 % — AB (ref 14.0–48.0)
MCH: 29.5 pg (ref 28.0–33.4)
MCHC: 32.8 g/dL (ref 32.0–35.9)
MCV: 90 fL (ref 82–98)
MONO#: 0.8 10*3/uL (ref 0.1–0.9)
MONO%: 11.6 % (ref 0.0–13.0)
NEUT%: 74.5 % (ref 40.0–80.0)
NEUTROS ABS: 4.8 10*3/uL (ref 1.5–6.5)
PLATELETS: 216 10*3/uL (ref 145–400)
RBC: 3.96 10*6/uL — AB (ref 4.20–5.70)
RDW: 17.3 % — ABNORMAL HIGH (ref 11.1–15.7)
WBC: 6.5 10*3/uL (ref 4.0–10.0)

## 2017-05-09 LAB — CMP (CANCER CENTER ONLY)
ALBUMIN: 3.8 g/dL (ref 3.3–5.5)
ALT(SGPT): 26 U/L (ref 10–47)
AST: 21 U/L (ref 11–38)
Alkaline Phosphatase: 113 U/L — ABNORMAL HIGH (ref 26–84)
BILIRUBIN TOTAL: 0.8 mg/dL (ref 0.20–1.60)
BUN, Bld: 13 mg/dL (ref 7–22)
CALCIUM: 9.4 mg/dL (ref 8.0–10.3)
CHLORIDE: 101 meq/L (ref 98–108)
CO2: 29 meq/L (ref 18–33)
CREATININE: 0.9 mg/dL (ref 0.6–1.2)
Glucose, Bld: 139 mg/dL — ABNORMAL HIGH (ref 73–118)
Potassium: 3.6 mEq/L (ref 3.3–4.7)
SODIUM: 145 meq/L (ref 128–145)
TOTAL PROTEIN: 6.9 g/dL (ref 6.4–8.1)

## 2017-05-09 LAB — LACTATE DEHYDROGENASE: LDH: 184 U/L (ref 125–245)

## 2017-05-09 MED ORDER — DEXAMETHASONE SODIUM PHOSPHATE 10 MG/ML IJ SOLN
10.0000 mg | Freq: Once | INTRAMUSCULAR | Status: AC
  Administered 2017-05-09: 10 mg via INTRAVENOUS

## 2017-05-09 MED ORDER — PEGFILGRASTIM 6 MG/0.6ML ~~LOC~~ PSKT
PREFILLED_SYRINGE | SUBCUTANEOUS | Status: AC
  Filled 2017-05-09: qty 0.6

## 2017-05-09 MED ORDER — SODIUM CHLORIDE 0.9 % IV SOLN
750.0000 mg/m2 | Freq: Once | INTRAVENOUS | Status: AC
  Administered 2017-05-09: 1820 mg via INTRAVENOUS
  Filled 2017-05-09: qty 91

## 2017-05-09 MED ORDER — DIPHENHYDRAMINE HCL 25 MG PO CAPS
ORAL_CAPSULE | ORAL | Status: AC
  Filled 2017-05-09: qty 2

## 2017-05-09 MED ORDER — PALONOSETRON HCL INJECTION 0.25 MG/5ML
0.2500 mg | Freq: Once | INTRAVENOUS | Status: AC
  Administered 2017-05-09: 0.25 mg via INTRAVENOUS

## 2017-05-09 MED ORDER — DEXAMETHASONE SODIUM PHOSPHATE 10 MG/ML IJ SOLN
INTRAMUSCULAR | Status: AC
  Filled 2017-05-09: qty 1

## 2017-05-09 MED ORDER — ACETAMINOPHEN 325 MG PO TABS
ORAL_TABLET | ORAL | Status: AC
Start: 2017-05-09 — End: 2017-05-09
  Filled 2017-05-09: qty 2

## 2017-05-09 MED ORDER — RITUXIMAB CHEMO INJECTION 500 MG/50ML
375.0000 mg/m2 | Freq: Once | INTRAVENOUS | Status: AC
  Administered 2017-05-09: 900 mg via INTRAVENOUS
  Filled 2017-05-09: qty 50

## 2017-05-09 MED ORDER — SODIUM CHLORIDE 0.9% FLUSH
10.0000 mL | INTRAVENOUS | Status: DC | PRN
  Filled 2017-05-09: qty 10

## 2017-05-09 MED ORDER — DOXORUBICIN HCL CHEMO IV INJECTION 2 MG/ML
50.0000 mg/m2 | Freq: Once | INTRAVENOUS | Status: AC
  Administered 2017-05-09: 122 mg via INTRAVENOUS
  Filled 2017-05-09: qty 61

## 2017-05-09 MED ORDER — PALONOSETRON HCL INJECTION 0.25 MG/5ML
INTRAVENOUS | Status: AC
Start: 2017-05-09 — End: 2017-05-09
  Filled 2017-05-09: qty 5

## 2017-05-09 MED ORDER — PEGFILGRASTIM 6 MG/0.6ML ~~LOC~~ PSKT
6.0000 mg | PREFILLED_SYRINGE | Freq: Once | SUBCUTANEOUS | Status: AC
  Administered 2017-05-09: 6 mg via SUBCUTANEOUS

## 2017-05-09 MED ORDER — SODIUM CHLORIDE 0.9% FLUSH
10.0000 mL | INTRAVENOUS | Status: DC | PRN
  Administered 2017-05-09: 10 mL
  Filled 2017-05-09: qty 10

## 2017-05-09 MED ORDER — DENOSUMAB 120 MG/1.7ML ~~LOC~~ SOLN
SUBCUTANEOUS | Status: AC
  Filled 2017-05-09: qty 1.7

## 2017-05-09 MED ORDER — DENOSUMAB 120 MG/1.7ML ~~LOC~~ SOLN
120.0000 mg | Freq: Once | SUBCUTANEOUS | Status: AC
  Administered 2017-05-09: 120 mg via SUBCUTANEOUS

## 2017-05-09 MED ORDER — HEPARIN SOD (PORK) LOCK FLUSH 100 UNIT/ML IV SOLN
500.0000 [IU] | Freq: Once | INTRAVENOUS | Status: AC | PRN
  Administered 2017-05-09: 500 [IU]
  Filled 2017-05-09: qty 5

## 2017-05-09 MED ORDER — VINCRISTINE SULFATE CHEMO INJECTION 1 MG/ML
2.0000 mg | Freq: Once | INTRAVENOUS | Status: AC
  Administered 2017-05-09: 2 mg via INTRAVENOUS
  Filled 2017-05-09: qty 2

## 2017-05-09 MED ORDER — SODIUM CHLORIDE 0.9 % IV SOLN
Freq: Once | INTRAVENOUS | Status: AC
  Administered 2017-05-09: 09:00:00 via INTRAVENOUS

## 2017-05-09 MED ORDER — ACETAMINOPHEN 325 MG PO TABS
650.0000 mg | ORAL_TABLET | Freq: Once | ORAL | Status: AC
  Administered 2017-05-09: 650 mg via ORAL

## 2017-05-09 MED ORDER — DIPHENHYDRAMINE HCL 25 MG PO CAPS
50.0000 mg | ORAL_CAPSULE | Freq: Once | ORAL | Status: AC
  Administered 2017-05-09: 50 mg via ORAL

## 2017-05-09 NOTE — Progress Notes (Signed)
Hematology and Oncology Follow Up Visit  Richard [name] [id number] May 04, 1941 76 y.o. 05/09/2017   Principle Diagnosis:   Diffuse large cell non-Hodgkin's lymphoma (IPI = 3) - NOT "double hit"  Pernicious anemia   Current Therapy:   R-CHOP - s/p cycle # 3 Vitamin B12 1 mg IM every month Xgeva 120 mg subcu q. 3 months     Interim History   Mr. Gershman comes back for follow-up.  He looks great.  He feels great.  The only issue with him was that he played golf a couple weeks ago.  He then developed severe lower back pain.  He saw his orthopedist.  His orthopedist, being incredibly thorough, did an MRI.  He sent me the MRI result.  The MRI results did not show any active lymphoma.  He had areas of treated lymphoma.  He did have a cyst down in the sacral region.  He is doing much better now.  He really has had no problems with chemotherapy.  He has had no nausea or vomiting.  He has had no rashes.  He has had no fever.  He does have a significant cardiac valve problem.  He is going to need to have valve surgery.  This is going to be put off after his chemo is complete.  Overall, says performance status is ECOG 1.  Medications:  Current Outpatient Medications:  .  allopurinol (ZYLOPRIM) 300 MG tablet, Take 450 mg by mouth daily. , Disp: , Rfl:  .  amLODipine (NORVASC) 5 MG tablet, TAKE 2 TABLETS (10 MG TOTAL) BY MOUTH DAILY. (Patient taking differently: TAKE 1 TABLETS BY MOUTH TWICE DAILY.), Disp: 60 tablet, Rfl: 11 .  aspirin 81 MG tablet, Take 81 mg by mouth daily., Disp: , Rfl:  .  atorvastatin (LIPITOR) 80 MG tablet, TAKE ONE TABLET BY MOUTH AT BEDTIME, Disp: 90 tablet, Rfl: 3 .  diphenoxylate-atropine (LOMOTIL) 2.5-0.025 MG tablet, Take 2 pills at the onset of diarrhea, then take one pill after every loose stool. Maximum 8 pills per 24h., Disp: 60 tablet, Rfl: 0 .  esomeprazole (NEXIUM) 40 MG capsule, Take 1 capsule (40 mg total) by mouth daily., Disp: 30 capsule, Rfl: 5 .   ezetimibe (ZETIA) 10 MG tablet, TAKE ONE TABLET BY MOUTH DAILY, Disp: 90 tablet, Rfl: 3 .  famciclovir (FAMVIR) 500 MG tablet, Take 1 tablet (500 mg total) by mouth daily., Disp: 30 tablet, Rfl: 12 .  fenofibrate 160 MG tablet, TAKE 1 TABLET BY MOUTH EVERY DAY, Disp: 30 tablet, Rfl: 5 .  fluticasone (FLONASE) 50 MCG/ACT nasal spray, INHALE ONE SPRAY INTO EACH NOSTRIL TWICE DAILY., Disp: 16 g, Rfl: 6 .  furosemide (LASIX) 40 MG tablet, TAKE 2 TABLETS (80 MG TOTAL) BY MOUTH 2 (TWO) TIMES DAILY., Disp: 360 tablet, Rfl: 0 .  glimepiride (AMARYL) 2 MG tablet, 1/2 tab po qd, Disp: 45 tablet, Rfl: 2 .  glucose 5 g chewable tablet, Chew 15 g by mouth as needed for low blood sugar., Disp: , Rfl:  .  glucose blood (FREESTYLE LITE) test strip, TEST BLOOD SUGAR bid, Disp: 100 each, Rfl: 11 .  KLOR-CON M20 20 MEQ tablet, TAKE ONE TABLET BY MOUTH TWICE DAILY, Disp: 60 tablet, Rfl: 11 .  Lancets (FREESTYLE) lancets, Use BID to check blood sugar.  DX E11.9, Disp: 100 each, Rfl: 6 .  lidocaine-prilocaine (EMLA) cream, Apply to affected area once, Disp: 30 g, Rfl: 3 .  LORazepam (ATIVAN) 0.5 MG tablet, Take 1 tablet (0.5 mg  total) by mouth every 6 (six) hours as needed (Nausea or vomiting). (Patient not taking: Reported on 04/18/2017), Disp: 30 tablet, Rfl: 0 .  Menthol, Topical Analgesic, (ICY HOT EX), Apply 1 application topically daily as needed (muscle pain)., Disp: , Rfl:  .  metFORMIN (GLUCOPHAGE) 1000 MG tablet, TAKE ONE AND ONE-HALF TABLETS BY MOUTH EVERY MORNING AND 1 TALBET IN THE EVENING., Disp: 225 tablet, Rfl: 1 .  metoprolol tartrate (LOPRESSOR) 25 MG tablet, TAKE ONE-HALF TABLET BY MOUTH TWICE DAILY (Patient taking differently: TAKE ONE-HALF TABLET BY MOUTH IN THE MORNING AND HALF TABLET IN THE EVENING), Disp: 90 tablet, Rfl: 3 .  montelukast (SINGULAIR) 10 MG tablet, Take 1 tablet (10 mg total) by mouth at bedtime., Disp: 90 tablet, Rfl: 1 .  nitroGLYCERIN (NITROSTAT) 0.4 MG SL tablet, Place 1 tablet  (0.4 mg total) under the tongue every 5 (five) minutes as needed for chest pain., Disp: 25 tablet, Rfl: 3 .  ondansetron (ZOFRAN) 8 MG tablet, Take 1 tablet (8 mg total) by mouth 2 (two) times daily as needed for refractory nausea / vomiting. Start on day 3 after cyclophosphamide chemotherapy. (Patient not taking: Reported on 04/18/2017), Disp: 30 tablet, Rfl: 1 .  polycarbophil (FIBERCON) 625 MG tablet, Take 625 mg by mouth daily., Disp: , Rfl:  .  predniSONE (DELTASONE) 20 MG tablet, Take 3 pills a day, with food, for 5 days. Start the day after each chemotherapy, Disp: 120 tablet, Rfl: 0 .  pregabalin (LYRICA) 75 MG capsule, Take 1 capsule (75 mg total) by mouth 3 (three) times daily., Disp: 270 capsule, Rfl: 3 .  prochlorperazine (COMPAZINE) 10 MG tablet, Take 1 tablet (10 mg total) by mouth every 6 (six) hours as needed (Nausea or vomiting). (Patient not taking: Reported on 04/18/2017), Disp: 30 tablet, Rfl: 6 No current facility-administered medications for this visit.   Facility-Administered Medications Ordered in Other Visits:  .  sodium chloride flush (NS) 0.9 % injection 10 mL, 10 mL, Intracatheter, PRN, [place], Holli Humbles, NP  Allergies:  Allergies  Allergen Reactions  . Benazepril Swelling    angioedema; he is not a candidate for any angiotensin receptor blockers because of this significant allergic reaction. Because of a history of documented adverse serious drug reaction;Medi Alert bracelet  is recommended  . Hctz [Hydrochlorothiazide] Anaphylaxis and Swelling    Tongue and lip swelling   . Aspirin Other (See Comments)    Gastritis, cant take 325 Mg aspirin   . Lactose Intolerance (Gi) Nausea And Vomiting    Past Medical History, Surgical history, Social history, and Family History were reviewed and updated.  Review of Systems: Review of Systems  Constitutional: Negative for appetite change, fatigue, fever and unexpected weight change.  HENT:   Negative for lump/mass,  mouth sores, sore throat and trouble swallowing.   Respiratory: Negative for cough, hemoptysis and shortness of breath.   Cardiovascular: Negative for leg swelling and palpitations.  Gastrointestinal: Negative for abdominal distention, abdominal pain, blood in stool, constipation, diarrhea, nausea and vomiting.  Genitourinary: Negative for bladder incontinence, dysuria, frequency and hematuria.   Musculoskeletal: Negative for arthralgias, gait problem and myalgias.  Skin: Negative for itching and rash.  Neurological: Negative for dizziness, extremity weakness, gait problem, headaches, numbness, seizures and speech difficulty.  Hematological: Does not bruise/bleed easily.  Psychiatric/Behavioral: Negative for depression and sleep disturbance. The patient is not nervous/anxious.     Physical Exam:  weight is 257 lb (116.6 kg). His oral temperature is 97.9 F (36.6 C). His  blood pressure is 135/62 and his pulse is 74. His respiration is 19 and oxygen saturation is 96%.   Wt Readings from Last 3 Encounters:  05/09/17 257 lb (116.6 kg)  04/16/17 255 lb 1.9 oz (115.7 kg)  04/03/17 258 lb (117 kg)    Physical Exam  Constitutional: He is oriented to person, place, and time.  HENT:  Head: Normocephalic and atraumatic.  Mouth/Throat: Oropharynx is clear and moist.  Eyes: EOM are normal. Pupils are equal, round, and reactive to light.  Neck: Normal range of motion.  Cardiovascular: Normal rate, regular rhythm and normal heart sounds.  Pulmonary/Chest: Effort normal and breath sounds normal.  Abdominal: Soft. Bowel sounds are normal.  Musculoskeletal: Normal range of motion. He exhibits no edema, tenderness or deformity.  Lymphadenopathy:    He has no cervical adenopathy.  Neurological: He is alert and oriented to person, place, and time.  Skin: Skin is warm and dry. No rash noted. No erythema.  Psychiatric: He has a normal mood and affect. His behavior is normal. Judgment and thought  content normal.  Vitals reviewed.    Lab Results  Component Value Date   WBC 6.5 05/09/2017   HGB 11.7 (L) 05/09/2017   HCT 35.7 (L) 05/09/2017   MCV 90 05/09/2017   PLT 216 05/09/2017     Chemistry      Component Value Date/Time   NA 145 05/09/2017 0748   K 3.6 05/09/2017 0748   CL 101 05/09/2017 0748   CO2 29 05/09/2017 0748   BUN 13 05/09/2017 0748   CREATININE 0.9 05/09/2017 0748      Component Value Date/Time   CALCIUM 9.4 05/09/2017 0748   ALKPHOS 113 (H) 05/09/2017 0748   AST 21 05/09/2017 0748   ALT 26 05/09/2017 0748   BILITOT 0.80 05/09/2017 0748      Impression and Plan: Mr. Paradiso is a 76 year old white male. He has diffuse large B-cell lymphoma. He does not have a double hit lymphoma.  Again, the PET scan clearly shows that we are on the right track. The R-CHOP is definitely working and is tolerated this well. I believe this is the best means for [place] to continue his therapy.  We will proceed with his fourth cycle of treatment.  I really need to add Xgeva.  I do not see a contraindication to having him on Xgeva.  I think Xgeva every 3 months would be appropriate.  We will see him back in another 3 weeks.  [name], MD 11/9/20189:04 AM

## 2017-05-09 NOTE — Telephone Encounter (Signed)
Received request for Physician Progress Notes from [facility] for Medicare compliance on diabetic testing suppies; forwarded to provider with attached OV notes to be signed/SLS 11/09

## 2017-05-09 NOTE — Patient Instructions (Signed)
[facility] Discharge Instructions for Patients Receiving Chemotherapy  Today you received the following chemotherapy agents Adriamycin, Cytoxan, Vincristine and Rituxan.  To help prevent nausea and vomiting after your treatment, we encourage you to take your nausea medication as directed BUT NO ZOFRAN FOR 3 DAYS.   If you develop nausea and vomiting that is not controlled by your nausea medication, call the clinic.   BELOW ARE SYMPTOMS THAT SHOULD BE REPORTED IMMEDIATELY:  *FEVER GREATER THAN 100.5 F  *CHILLS WITH OR WITHOUT FEVER  NAUSEA AND VOMITING THAT IS NOT CONTROLLED WITH YOUR NAUSEA MEDICATION  *UNUSUAL SHORTNESS OF BREATH  *UNUSUAL BRUISING OR BLEEDING  TENDERNESS IN MOUTH AND THROAT WITH OR WITHOUT PRESENCE OF ULCERS  *URINARY PROBLEMS  *BOWEL PROBLEMS  UNUSUAL RASH Items with * indicate a potential emergency and should be followed up as soon as possible.  Feel free to call the clinic you have any questions or concerns. The clinic phone number is [phone].  Please show the [facility] at check-in to the Emergency Department and triage nurse.   Denosumab injection What is this medicine? DENOSUMAB (den oh sue mab) slows bone breakdown. Prolia is used to treat osteoporosis in women after menopause and in men. Delton See is used to treat a high calcium level due to cancer and to prevent bone fractures and other bone problems caused by multiple myeloma or cancer bone metastases. Delton See is also used to treat giant cell tumor of the bone. This medicine may be used for other purposes; ask your health care provider or pharmacist if you have questions. COMMON BRAND NAME(S): Prolia, XGEVA What should I tell my health care provider before I take this medicine? They need to know if you have any of these conditions: -dental disease -having surgery or tooth extraction -infection -kidney disease -low levels of calcium or Vitamin D in the  blood -malnutrition -on hemodialysis -skin conditions or sensitivity -thyroid or parathyroid disease -an unusual reaction to denosumab, other medicines, foods, dyes, or preservatives -pregnant or trying to get pregnant -breast-feeding How should I use this medicine? This medicine is for injection under the skin. It is given by a health care professional in a hospital or clinic setting. If you are getting Prolia, a special MedGuide will be given to you by the pharmacist with each prescription and refill. Be sure to read this information carefully each time. For Prolia, talk to your pediatrician regarding the use of this medicine in children. Special care may be needed. For Delton See, talk to your pediatrician regarding the use of this medicine in children. While this drug may be prescribed for children as young as 13 years for selected conditions, precautions do apply. Overdosage: If you think you have taken too much of this medicine contact a poison control center or emergency room at once. NOTE: This medicine is only for you. Do not share this medicine with others. What if I miss a dose? It is important not to miss your dose. Call your doctor or health care professional if you are unable to keep an appointment. What may interact with this medicine? Do not take this medicine with any of the following medications: -other medicines containing denosumab This medicine may also interact with the following medications: -medicines that lower your chance of fighting infection -steroid medicines like prednisone or cortisone This list may not describe all possible interactions. Give your health care provider a list of all the medicines, herbs, non-prescription drugs, or dietary supplements you use. Also  tell them if you smoke, drink alcohol, or use illegal drugs. Some items may interact with your medicine. What should I watch for while using this medicine? Visit your doctor or health care professional for  regular checks on your progress. Your doctor or health care professional may order blood tests and other tests to see how you are doing. Call your doctor or health care professional for advice if you get a fever, chills or sore throat, or other symptoms of a cold or flu. Do not treat yourself. This drug may decrease your body's ability to fight infection. Try to avoid being around people who are sick. You should make sure you get enough calcium and vitamin D while you are taking this medicine, unless your doctor tells you not to. Discuss the foods you eat and the vitamins you take with your health care professional. See your dentist regularly. Brush and floss your teeth as directed. Before you have any dental work done, tell your dentist you are receiving this medicine. Do not become pregnant while taking this medicine or for 5 months after stopping it. Talk with your doctor or health care professional about your birth control options while taking this medicine. Women should inform their doctor if they wish to become pregnant or think they might be pregnant. There is a potential for serious side effects to an unborn child. Talk to your health care professional or pharmacist for more information. What side effects may I notice from receiving this medicine? Side effects that you should report to your doctor or health care professional as soon as possible: -allergic reactions like skin rash, itching or hives, swelling of the face, lips, or tongue -bone pain -breathing problems -dizziness -jaw pain, especially after dental work -redness, blistering, peeling of the skin -signs and symptoms of infection like fever or chills; cough; sore throat; pain or trouble passing urine -signs of low calcium like fast heartbeat, muscle cramps or muscle pain; pain, tingling, numbness in the hands or feet; seizures -unusual bleeding or bruising -unusually weak or tired Side effects that usually do not require medical  attention (report to your doctor or health care professional if they continue or are bothersome): -constipation -diarrhea -headache -joint pain -loss of appetite -muscle pain -runny nose -tiredness -upset stomach This list may not describe all possible side effects. Call your doctor for medical advice about side effects. You may report side effects to FDA at 1-800-FDA-1088. Where should I keep my medicine? This medicine is only given in a clinic, doctor's office, or other health care setting and will not be stored at home. NOTE: This sheet is a summary. It may not cover all possible information. If you have questions about this medicine, talk to your doctor, pharmacist, or health care provider.  2018 Elsevier/Gold Standard (2016-07-09 19:17:21)

## 2017-05-13 ENCOUNTER — Ambulatory Visit (INDEPENDENT_AMBULATORY_CARE_PROVIDER_SITE_OTHER): Payer: Medicare Other | Admitting: Physician Assistant

## 2017-05-13 ENCOUNTER — Encounter: Payer: Self-pay | Admitting: Physician Assistant

## 2017-05-13 ENCOUNTER — Telehealth: Payer: Self-pay | Admitting: Cardiology

## 2017-05-13 VITALS — BP 140/72 | HR 88 | Ht 72.0 in | Wt 273.4 lb

## 2017-05-13 DIAGNOSIS — I350 Nonrheumatic aortic (valve) stenosis: Secondary | ICD-10-CM | POA: Diagnosis not present

## 2017-05-13 DIAGNOSIS — I25810 Atherosclerosis of coronary artery bypass graft(s) without angina pectoris: Secondary | ICD-10-CM | POA: Diagnosis not present

## 2017-05-13 DIAGNOSIS — Z79899 Other long term (current) drug therapy: Secondary | ICD-10-CM

## 2017-05-13 DIAGNOSIS — G5793 Unspecified mononeuropathy of bilateral lower limbs: Secondary | ICD-10-CM | POA: Diagnosis not present

## 2017-05-13 DIAGNOSIS — Z9989 Dependence on other enabling machines and devices: Secondary | ICD-10-CM | POA: Diagnosis not present

## 2017-05-13 DIAGNOSIS — I10 Essential (primary) hypertension: Secondary | ICD-10-CM

## 2017-05-13 DIAGNOSIS — I5033 Acute on chronic diastolic (congestive) heart failure: Secondary | ICD-10-CM

## 2017-05-13 DIAGNOSIS — Z6839 Body mass index (BMI) 39.0-39.9, adult: Secondary | ICD-10-CM | POA: Diagnosis not present

## 2017-05-13 DIAGNOSIS — M1A09X0 Idiopathic chronic gout, multiple sites, without tophus (tophi): Secondary | ICD-10-CM | POA: Diagnosis not present

## 2017-05-13 DIAGNOSIS — I6529 Occlusion and stenosis of unspecified carotid artery: Secondary | ICD-10-CM

## 2017-05-13 DIAGNOSIS — E785 Hyperlipidemia, unspecified: Secondary | ICD-10-CM

## 2017-05-13 DIAGNOSIS — G4733 Obstructive sleep apnea (adult) (pediatric): Secondary | ICD-10-CM | POA: Diagnosis not present

## 2017-05-13 DIAGNOSIS — E669 Obesity, unspecified: Secondary | ICD-10-CM | POA: Diagnosis not present

## 2017-05-13 DIAGNOSIS — R0602 Shortness of breath: Secondary | ICD-10-CM

## 2017-05-13 DIAGNOSIS — E119 Type 2 diabetes mellitus without complications: Secondary | ICD-10-CM

## 2017-05-13 DIAGNOSIS — M150 Primary generalized (osteo)arthritis: Secondary | ICD-10-CM | POA: Diagnosis not present

## 2017-05-13 MED ORDER — FUROSEMIDE 40 MG PO TABS: 120.0000 mg | ORAL_TABLET | Freq: Two times a day (BID) | ORAL | 5 refills | Status: DC

## 2017-05-13 MED ORDER — POTASSIUM CHLORIDE CRYS ER 20 MEQ PO TBCR: 40.0000 meq | EXTENDED_RELEASE_TABLET | Freq: Two times a day (BID) | ORAL | 5 refills | Status: DC

## 2017-05-13 NOTE — Telephone Encounter (Signed)
Spoke with patient and he is having lower extremity edema bilaterally from feet up to knees. Feels very tight, denies shortness of breath. He is in the middle of chemotherapy and has 4 more treatments. Scheduled appointment for today with Estrella Myrtle PA

## 2017-05-13 NOTE — Patient Instructions (Addendum)
Medication Instructions:   INCREASE lasix to 120mg  twice daily INCREASE potassium to 106mEq twice daily  Labwork:  Your physician recommends that you return for lab work in: [place] (Tuesday 11/20) - [facility]  Testing/Procedures:  Your physician has requested that you have an echocardiogram @ [address] 3rd Floor within next 2 weeks before visit with Richard [name]. Echocardiography is a painless test that uses sound waves to create images of your heart. It provides your doctor with information about the size and shape of your heart and how well your heart's chambers and valves are working. This procedure takes approximately one hour. There are no restrictions for this procedure.  Follow-Up:  Your physician recommends that you schedule a follow-up appointment in: TWO WEEKS with Richard [name], Richard [name] on day Richard [name] is in the office   If you need a refill on your cardiac medications before your next appointment, please call your pharmacy.  Any Other Special Instructions Will Be Listed Below (If Applicable).

## 2017-05-13 NOTE — Telephone Encounter (Signed)
Please call,pt says his legs and ankles are swollen real big. He said they feel real hot,feels like the skin is going to burst. Please call to advise.

## 2017-05-13 NOTE — Progress Notes (Signed)
Cardiology Office Note    Date:  05/13/2017   ID:  Richard [name], DOB 1941-05-13, MRN [id number]  PCP:  [name], DO  Cardiologist:  Dr. Percival Spanish  Chief Complaint  Patient presents with  . Follow-up    seen for Dr. Percival Spanish. BLE edema    History of Present Illness:  Richard [name] is a 76 y.o. male with CAD s/p CABG, GERD, HTN, HLD, OSA on CPAP and DM II. His last cardiac catheterization on 03/16/2014 showed 100% mid LAD chronic occlusion, patent LIMA to distal LAD, diffuse 90% stenosis in OM1, occluded OM 2, patent sequential SVG to OM1/OM 2, 99% proximal RCA stenosis with 100% mid RCA occlusion followed by a patent graft to RCA. He does have occluded SVG to diagonal branch. He had a history of bradycardia and was taken off his beta blocker. He was last seen by Dr. Percival Spanish on 01/03/2017, he says occasional a history of heart rate got down to the 30s, but he does not have any presyncope or syncope. He was given a 24-hour Holter monitor and plan for repeat echocardiogram. Holter monitor showed occasional transient 2-1 heart block, he is on low-dose beta blocker, however he is asymptomatic, therefore no medication adjustment was made. As far as echocardiogram, it does show severe aortic stenosis.  I last saw the patient on 02/17/2017, he denied any significant shortness of breath and was able to walk 1/2 mile and play golf almost on a daily basis. He was diagnosed was enlarged lymph node at the time. Given the lack of symptom, I discussed his case with Dr. Percival Spanish and we recommended to proceed with oncology evaluation first and follow-up in 3 month for reassessment. He was later diagnosed with diffuse large cell non-Hodgkin's lymphoma through biopsy of the left axillary lymph node in August 2018. He underwent Port-A-Cath placement on 03/06/2017 and was subsequently started on chemotherapy.  Patient presents today for evaluation of lower extremity edema. He has 4 more cycle of  chemotherapy to go. He likely will finish chemotherapy near the beginning of February 2019. For the past few weeks, he has been having worsening lower extremity edema. Although he denies any PND, he does have some degree of orthopnea. He has been compliant with CPAP. On physical exam, he has at least 3+ pitting edema in both lower extremity. He also described a tightness in the BLE. He has been noticing increasing amount of shortness of breath with exertion. He is clearly in acute heart failure, I have increased his Lasix to 120 mg twice a day. Recent lab work showed stable renal function. I also increased his potassium supplement to 40 mEq twice a day as well. He will need a repeat basic metabolic panel in one week. I will see him back in 2 weeks for reassessment. He will also need a repeat echocardiogram as well to make sure his EF has not decreased. As long as his EF is stable, we may consider evaluate him for TAVR after he finished chemotherapy, however this may be done earlier if his symptom significantly worsens.   Past Medical History:  Diagnosis Date  . Adenomatous colon polyp 05/01/2015   tubular  . Anemia   . Antritis (stomach)    mild  . Anxiety   . Aortic stenosis   . Arthritis    "knees, hips, shoulders" (03/15/2014)  . Axillary adenopathy 02/25/2017  . CAD (coronary artery disease)    bypass 2001  . Chronic lower back pain   .  Diverticulosis    mild, left colon  . Duodenitis    chronic  . Esophageal stricture    X 4  . GERD (gastroesophageal reflux disease)   . Gout   . Heart murmur    Echo in july 2018  . History of gout   . HNP (herniated nucleus pulposus), lumbar   . HTN (hypertension)   . Iron deficiency   . Lymphoma ([place])   . Migraines    "til age 28 yrs"  . Mixed hyperlipidemia   . OSA on CPAP    with 2L O2 at night  . Peptic stricture of esophagus   . Skin cancer    "right neck cut off; several frozen off my arms" (03/15/2014)  . Sliding hiatal hernia   .  Spinal stenosis   . Type II diabetes mellitus ([place])   . Vitamin B12 deficiency   . Wenckebach block    a. brady down to 30s due to frequent block, metoprolol held in response.    Past Surgical History:  Procedure Laterality Date  . APPENDECTOMY  ~ 1952  . BACK SURGERY    . COLONOSCOPY W/ BIOPSIES AND POLYPECTOMY  2013  . CORONARY ANGIOPLASTY  1993  . CORONARY ANGIOPLASTY WITH STENT PLACEMENT  05/1997   "1"  . CORONARY ARTERY BYPASS GRAFT  03/2000   "CABG X5"  . ESOPHAGEAL DILATION  X 3-4   Dr. Lyla Son; "last one was in the 1990's"  . ESOPHAGOGASTRODUODENOSCOPY     multiple  . FLEXIBLE SIGMOIDOSCOPY     multiple  . KNEE ARTHROSCOPY Left 2011   meniscus repair  . MYELOGRAM  04/06/13   lumbar, Dr Gladstone Lighter  . PANENDOSCOPY    . SHOULDER SURGERY Right 08/2010   screws placed; "tendons tore off"  . SKIN CANCER EXCISION Right    "neck"  . TONSILLECTOMY  ~ 1954  . UPPER GI ENDOSCOPY  2013   Gastritis; Dr Carlean Purl  . VASECTOMY      Current Medications: Outpatient Medications Prior to Visit  Medication Sig Dispense Refill  . allopurinol (ZYLOPRIM) 300 MG tablet Take 450 mg by mouth daily.     [name] amLODipine (NORVASC) 5 MG tablet TAKE 2 TABLETS (10 MG TOTAL) BY MOUTH DAILY. (Patient taking differently: TAKE 1 TABLETS BY MOUTH TWICE DAILY.) 60 tablet 11  . aspirin 81 MG tablet Take 81 mg by mouth daily.    [name] atorvastatin (LIPITOR) 80 MG tablet TAKE ONE TABLET BY MOUTH AT BEDTIME 90 tablet 3  . diphenoxylate-atropine (LOMOTIL) 2.5-0.025 MG tablet Take 2 pills at the onset of diarrhea, then take one pill after every loose stool. Maximum 8 pills per 24h. 60 tablet 0  . esomeprazole (NEXIUM) 40 MG capsule Take 1 capsule (40 mg total) by mouth daily. 30 capsule 5  . ezetimibe (ZETIA) 10 MG tablet TAKE ONE TABLET BY MOUTH DAILY 90 tablet 3  . famciclovir (FAMVIR) 500 MG tablet Take 1 tablet (500 mg total) by mouth daily. 30 tablet 12  . fenofibrate 160 MG tablet TAKE 1 TABLET BY MOUTH  EVERY DAY 30 tablet 5  . fluticasone (FLONASE) 50 MCG/ACT nasal spray INHALE ONE SPRAY INTO EACH NOSTRIL TWICE DAILY. 16 g 6  . glimepiride (AMARYL) 2 MG tablet 1/2 tab po qd 45 tablet 2  . glucose 5 g chewable tablet Chew 15 g by mouth as needed for low blood sugar.    [name] glucose blood (FREESTYLE LITE) test strip TEST BLOOD SUGAR bid 100 each 11  .  Lancets (FREESTYLE) lancets Use BID to check blood sugar.  DX E11.9 100 each 6  . lidocaine-prilocaine (EMLA) cream Apply to affected area once 30 g 3  . LORazepam (ATIVAN) 0.5 MG tablet Take 1 tablet (0.5 mg total) by mouth every 6 (six) hours as needed (Nausea or vomiting). 30 tablet 0  . Menthol, Topical Analgesic, (ICY HOT EX) Apply 1 application topically daily as needed (muscle pain).    . metFORMIN (GLUCOPHAGE) 1000 MG tablet TAKE ONE AND ONE-HALF TABLETS BY MOUTH EVERY MORNING AND 1 TALBET IN THE EVENING. 225 tablet 1  . metoprolol tartrate (LOPRESSOR) 25 MG tablet TAKE ONE-HALF TABLET BY MOUTH TWICE DAILY (Patient taking differently: TAKE ONE-HALF TABLET BY MOUTH IN THE MORNING AND HALF TABLET IN THE EVENING) 90 tablet 3  . montelukast (SINGULAIR) 10 MG tablet Take 1 tablet (10 mg total) by mouth at bedtime. 90 tablet 1  . nitroGLYCERIN (NITROSTAT) 0.4 MG SL tablet Place 1 tablet (0.4 mg total) under the tongue every 5 (five) minutes as needed for chest pain. 25 tablet 3  . ondansetron (ZOFRAN) 8 MG tablet Take 1 tablet (8 mg total) by mouth 2 (two) times daily as needed for refractory nausea / vomiting. Start on day 3 after cyclophosphamide chemotherapy. 30 tablet 1  . polycarbophil (FIBERCON) 625 MG tablet Take 625 mg by mouth daily.    . predniSONE (DELTASONE) 20 MG tablet Take 3 pills a day, with food, for 5 days. Start the day after each chemotherapy 120 tablet 0  . pregabalin (LYRICA) 75 MG capsule Take 1 capsule (75 mg total) by mouth 3 (three) times daily. 270 capsule 3  . prochlorperazine (COMPAZINE) 10 MG tablet Take 1 tablet (10 mg  total) by mouth every 6 (six) hours as needed (Nausea or vomiting). 30 tablet 6  . furosemide (LASIX) 40 MG tablet TAKE 2 TABLETS (80 MG TOTAL) BY MOUTH 2 (TWO) TIMES DAILY. 360 tablet 0  . KLOR-CON M20 20 MEQ tablet TAKE ONE TABLET BY MOUTH TWICE DAILY 60 tablet 11   No facility-administered medications prior to visit.      Allergies:   Benazepril; Hctz [hydrochlorothiazide]; Aspirin; and Lactose intolerance (gi)   Social History   Socioeconomic History  . Marital status: Divorced    Spouse name: None  . Number of children: 2  . Years of education: None  . Highest education level: None  Social Needs  . Financial resource strain: None  . Food insecurity - worry: None  . Food insecurity - inability: None  . Transportation needs - medical: None  . Transportation needs - non-medical: None  Occupational History    Employer: RETIRED  Tobacco Use  . Smoking status: Never Smoker  . Smokeless tobacco: Never Used  Substance and Sexual Activity  . Alcohol use: No    Alcohol/week: 0.0 oz    Comment: "last drink was in 2012"( 03/15/2014)  . Drug use: No  . Sexual activity: Not Currently  Other Topics Concern  . None  Social History Narrative   Divorced, lives with a roommate.   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     Family History:  The patient's family history includes Diabetes in his maternal grandmother; Heart attack in his paternal grandmother; Hypertension in his father; Pancreatic cancer in his mother; Stroke in his  father and maternal grandmother.   ROS:   Please see the history of present illness.    ROS All other systems reviewed and are negative.   PHYSICAL EXAM:   VS:  BP 140/72   Pulse 88   Ht 6' (1.829 m)   Wt 273 lb 6.4 oz (124 kg)   BMI 37.08 kg/m    GEN: Well nourished, well  developed, in no acute distress  HEENT: normal  Neck: no JVD, carotid bruits, or masses Cardiac: RRR; no rubs, or gallops   3+ BLE edema, 3/6 systolic murmur Respiratory:  clear to auscultation bilaterally, normal work of breathing GI: soft, nontender, nondistended, + BS MS: no deformity or atrophy  Skin: warm and dry, no rash Neuro:  Alert and Oriented x 3, Strength and sensation are intact Psych: euthymic mood, full affect  Wt Readings from Last 3 Encounters:  05/13/17 273 lb 6.4 oz (124 kg)  05/09/17 257 lb (116.6 kg)  04/16/17 255 lb 1.9 oz (115.7 kg)      Studies/Labs Reviewed:   EKG:  EKG is ordered today.  The ekg ordered today demonstrates normal sinus rhythm with PACs  Recent Labs: 05/09/2017: ALT(SGPT) 26; BUN, Bld 13; Creat 0.9; HGB 11.7; Platelets 216; Potassium 3.6; Sodium 145   Lipid Panel    Component Value Date/Time   CHOL 125 11/21/2016 1040   TRIG 195.0 (H) 11/21/2016 1040   HDL 29.20 (L) 11/21/2016 1040   CHOLHDL 4 11/21/2016 1040   VLDL 39.0 11/21/2016 1040   LDLCALC 57 11/21/2016 1040   LDLDIRECT 71.0 09/18/2015 0907    Additional studies/ records that were reviewed today include:   Echo 01/16/2017 LV EF: 60% - 65%  Study Conclusions  - Left ventricle: The cavity size was normal. Wall thickness was increased in a pattern of mild LVH. Systolic function was normal. The estimated ejection fraction was in the range of 60% to 65%. Wall motion was normal; there were no regional wall motion abnormalities. Doppler parameters are consistent with abnormal left ventricular relaxation (grade 1 diastolic dysfunction). - Aortic valve: Valve mobility was restricted. There was severe stenosis. Peak velocity (S): 435 cm/s. Mean gradient (S): 40 mm Hg. Valve area (VTI): 0.78 cm^2. - Mitral valve: Calcified annulus. There was mild regurgitation. - Left atrium: The atrium was mildly dilated. - Right ventricle: The cavity size was moderately  dilated. Wall thickness was normal. - Pulmonic valve: Transvalvular velocity was minimally increased. There was no evidence for stenosis. Peak gradient (S): 18 mm Hg.  Impressions:  - When compared to prior ECHO, aortic stenosis has advanced.     Holter Monitor 12/2016 Study Highlights   NSR Baseline artifact Rare PVCs Moderate atrial ectopy. Mobitz Type I AV block noted 2:1 AV block noted. No symptoms reported.        PET scan 02/14/2017 IMPRESSION: 1. Multifocal hypermetabolic lymphadenopathy within the neck, chest and upper abdomen as described. Findings are worrisome for lymphoma. Tissue sampling recommended. 2. Hypermetabolic osseous lesions within the C3 vertebral body and left sacrum, also suspicious for lymphomatous involvement. 3. No evidence of solid organ involvement or splenomegaly. 4. Aortic Atherosclerosis (ICD10-I70.0).    ASSESSMENT:    1. Acute on chronic diastolic heart failure ([place])   2. Aortic valve stenosis, etiology of cardiac valve disease unspecified   3. Shortness of breath   4. Medication management   5. Coronary artery disease involving coronary bypass graft of native heart without angina pectoris   6. Essential hypertension  7. Hyperlipidemia, unspecified hyperlipidemia type   8. OSA on CPAP   9. Controlled type 2 diabetes mellitus without complication, without long-term current use of insulin (H[facility])      PLAN:  In order of problems listed above:  1. Acute on chronic diastolic heart failure: increase Lasix to 120 mg twice a day. We'll also increase potassium supplement to 40 mEq twice a day as well. Obtain basic metabolic panel in one week. The degree of volume accumulation is quite impressive, likely related to his severe aortic stenosis. We'll also repeat an echocardiogram as well to make sure his ejection fraction has not changed. I will see him back in 2 weeks for reassessment.  2. Severe aortic stenosis: Seen  on previous echocardiogram, likely contributing to the current acute heart failure symptoms.  3. Large B-cell lymphoma: Tolerating chemotherapy very well. Had a Port-A-Cath placed in September. According to the patient, he likely will finish the next 4 cycles of chemotherapy by early February.  4. Bradycardia: He had transient 2-1 AV block on previous heart monitor, he was asymptomatic. We'll continue low-dose metoprolol, avoid up titration of AV nodal blocking agent.  5. Hypertension: blood pressure well-controlled  6. Hyperlipidemia: continue Zetia and Lipitor. Last lipid panel obtained in May 2018 showed cholesterol 125, triglyceride 195, HDL 29, LDL 57.  7. Obstructive sleep apnea on CPAP: he has been compliant with CPAP therapy    Medication Adjustments/Labs and Tests Ordered: Current medicines are reviewed at length with the patient today.  Concerns regarding medicines are outlined above.  Medication changes, Labs and Tests ordered today are listed in the Patient Instructions below. Patient Instructions  Medication Instructions:   INCREASE lasix to 120mg  twice daily INCREASE potassium to 62mEq twice daily  Labwork:  Your physician recommends that you return for lab work in: [place] (Tuesday 11/20) - [facility]  Testing/Procedures:  Your physician has requested that you have an echocardiogram @ [address]rd [address] within next 2 weeks before visit with Isaac Laud. Echocardiography is a painless test that uses sound waves to create images of your heart. It provides your doctor with information about the size and shape of your heart and how well your heart's chambers and valves are working. This procedure takes approximately one hour. There are no restrictions for this procedure.  Follow-Up:  Your physician recommends that you schedule a follow-up appointment in: TWO WEEKS with Isaac Laud, [place] on day Dr. Percival Spanish is in the office   If you need a refill on your cardiac medications  before your next appointment, please call your pharmacy.  Any Other Special Instructions Will Be Listed Below (If Applicable).       Hilbert Corrigan, [place]  05/13/2017 2:32 PM    [facility] [place], [place], [place]  [address] Phone: [phone]; Fax: [phone]

## 2017-05-14 ENCOUNTER — Ambulatory Visit (HOSPITAL_BASED_OUTPATIENT_CLINIC_OR_DEPARTMENT_OTHER): Payer: Medicare Other

## 2017-05-14 ENCOUNTER — Other Ambulatory Visit (HOSPITAL_BASED_OUTPATIENT_CLINIC_OR_DEPARTMENT_OTHER): Payer: Medicare Other

## 2017-05-14 ENCOUNTER — Ambulatory Visit: Payer: Medicare Other

## 2017-05-14 VITALS — BP 127/51 | HR 56 | Temp 97.8°F | Resp 20

## 2017-05-14 DIAGNOSIS — D510 Vitamin B12 deficiency anemia due to intrinsic factor deficiency: Secondary | ICD-10-CM

## 2017-05-14 DIAGNOSIS — C8332 Diffuse large B-cell lymphoma, intrathoracic lymph nodes: Secondary | ICD-10-CM

## 2017-05-14 DIAGNOSIS — D519 Vitamin B12 deficiency anemia, unspecified: Secondary | ICD-10-CM

## 2017-05-14 DIAGNOSIS — D508 Other iron deficiency anemias: Secondary | ICD-10-CM

## 2017-05-14 LAB — CMP (CANCER CENTER ONLY)
ALK PHOS: 113 U/L — AB (ref 26–84)
ALT: 20 U/L (ref 10–47)
AST: 17 U/L (ref 11–38)
Albumin: 3.5 g/dL (ref 3.3–5.5)
BUN: 24 mg/dL — AB (ref 7–22)
CO2: 26 mEq/L (ref 18–33)
CREATININE: 1 mg/dL (ref 0.6–1.2)
Calcium: 8.2 mg/dL (ref 8.0–10.3)
Chloride: 105 mEq/L (ref 98–108)
Glucose, Bld: 262 mg/dL — ABNORMAL HIGH (ref 73–118)
Potassium: 4.3 mEq/L (ref 3.3–4.7)
Sodium: 140 mEq/L (ref 128–145)
TOTAL PROTEIN: 5.9 g/dL — AB (ref 6.4–8.1)
Total Bilirubin: 0.6 mg/dl (ref 0.20–1.60)

## 2017-05-14 LAB — CBC WITH DIFFERENTIAL (CANCER CENTER ONLY)
BASO#: 0 10*3/uL (ref 0.0–0.2)
BASO%: 0.3 % (ref 0.0–2.0)
EOS%: 0 % (ref 0.0–7.0)
Eosinophils Absolute: 0 10*3/uL (ref 0.0–0.5)
HCT: 31.9 % — ABNORMAL LOW (ref 38.7–49.9)
HGB: 10.4 g/dL — ABNORMAL LOW (ref 13.0–17.1)
LYMPH#: 0.3 10*3/uL — ABNORMAL LOW (ref 0.9–3.3)
LYMPH%: 2.2 % — AB (ref 14.0–48.0)
MCH: 29.4 pg (ref 28.0–33.4)
MCHC: 32.6 g/dL (ref 32.0–35.9)
MCV: 90 fL (ref 82–98)
MONO#: 0.2 10*3/uL (ref 0.1–0.9)
MONO%: 1.3 % (ref 0.0–13.0)
NEUT#: 10.9 10*3/uL — ABNORMAL HIGH (ref 1.5–6.5)
NEUT%: 96.2 % — ABNORMAL HIGH (ref 40.0–80.0)
PLATELETS: 118 10*3/uL — AB (ref 145–400)
RBC: 3.54 10*6/uL — AB (ref 4.20–5.70)
RDW: 17.1 % — ABNORMAL HIGH (ref 11.1–15.7)
WBC: 11.3 10*3/uL — AB (ref 4.0–10.0)

## 2017-05-14 MED ORDER — HEPARIN SOD (PORK) LOCK FLUSH 100 UNIT/ML IV SOLN
500.0000 [IU] | Freq: Once | INTRAVENOUS | Status: AC | PRN
  Administered 2017-05-14: 500 [IU]
  Filled 2017-05-14: qty 5

## 2017-05-14 MED ORDER — SODIUM CHLORIDE 0.9% FLUSH
10.0000 mL | INTRAVENOUS | Status: DC | PRN
  Administered 2017-05-14: 10 mL
  Filled 2017-05-14: qty 10

## 2017-05-14 MED ORDER — CYANOCOBALAMIN 1000 MCG/ML IJ SOLN
1000.0000 ug | Freq: Once | INTRAMUSCULAR | Status: AC
  Administered 2017-05-14: 1000 ug via INTRAMUSCULAR

## 2017-05-14 MED ORDER — CYANOCOBALAMIN 1000 MCG/ML IJ SOLN
INTRAMUSCULAR | Status: AC
  Filled 2017-05-14: qty 1

## 2017-05-16 ENCOUNTER — Ambulatory Visit (HOSPITAL_COMMUNITY): Payer: Medicare Other | Attending: Cardiology

## 2017-05-16 ENCOUNTER — Other Ambulatory Visit: Payer: Self-pay

## 2017-05-16 ENCOUNTER — Other Ambulatory Visit: Payer: Self-pay | Admitting: Family Medicine

## 2017-05-16 DIAGNOSIS — R609 Edema, unspecified: Secondary | ICD-10-CM | POA: Insufficient documentation

## 2017-05-16 DIAGNOSIS — I2510 Atherosclerotic heart disease of native coronary artery without angina pectoris: Secondary | ICD-10-CM | POA: Diagnosis not present

## 2017-05-16 DIAGNOSIS — G4733 Obstructive sleep apnea (adult) (pediatric): Secondary | ICD-10-CM | POA: Insufficient documentation

## 2017-05-16 DIAGNOSIS — R0602 Shortness of breath: Secondary | ICD-10-CM | POA: Diagnosis not present

## 2017-05-16 DIAGNOSIS — I10 Essential (primary) hypertension: Secondary | ICD-10-CM | POA: Diagnosis not present

## 2017-05-16 DIAGNOSIS — R0600 Dyspnea, unspecified: Secondary | ICD-10-CM | POA: Diagnosis not present

## 2017-05-16 DIAGNOSIS — I081 Rheumatic disorders of both mitral and tricuspid valves: Secondary | ICD-10-CM | POA: Insufficient documentation

## 2017-05-16 DIAGNOSIS — I441 Atrioventricular block, second degree: Secondary | ICD-10-CM | POA: Insufficient documentation

## 2017-05-16 DIAGNOSIS — Z8249 Family history of ischemic heart disease and other diseases of the circulatory system: Secondary | ICD-10-CM | POA: Diagnosis not present

## 2017-05-16 DIAGNOSIS — E785 Hyperlipidemia, unspecified: Secondary | ICD-10-CM | POA: Insufficient documentation

## 2017-05-16 DIAGNOSIS — I350 Nonrheumatic aortic (valve) stenosis: Secondary | ICD-10-CM

## 2017-05-16 DIAGNOSIS — E119 Type 2 diabetes mellitus without complications: Secondary | ICD-10-CM | POA: Diagnosis not present

## 2017-05-16 DIAGNOSIS — C8590 Non-Hodgkin lymphoma, unspecified, unspecified site: Secondary | ICD-10-CM | POA: Insufficient documentation

## 2017-05-19 NOTE — Progress Notes (Signed)
Aortic valve stenosis has progressed, likely contributing to his recent symptom. Will discuss further treatment on followup, will need to make sure followup is on a day Dr. Percival Spanish is also in the office.

## 2017-05-20 DIAGNOSIS — Z79899 Other long term (current) drug therapy: Secondary | ICD-10-CM | POA: Diagnosis not present

## 2017-05-20 LAB — BASIC METABOLIC PANEL
BUN/Creatinine Ratio: 14 (ref 10–24)
BUN: 12 mg/dL (ref 8–27)
CALCIUM: 8.9 mg/dL (ref 8.6–10.2)
CO2: 24 mmol/L (ref 20–29)
Chloride: 101 mmol/L (ref 96–106)
Creatinine, Ser: 0.85 mg/dL (ref 0.76–1.27)
GFR, EST AFRICAN AMERICAN: 96 mL/min/{1.73_m2} (ref >59)
GFR, EST NON AFRICAN AMERICAN: 83 mL/min/{1.73_m2} (ref >59)
Glucose: 166 mg/dL — ABNORMAL HIGH (ref 65–99)
POTASSIUM: 4 mmol/L (ref 3.5–5.2)
Sodium: 142 mmol/L (ref 134–144)

## 2017-05-21 DIAGNOSIS — H02132 Senile ectropion of right lower eyelid: Secondary | ICD-10-CM | POA: Diagnosis not present

## 2017-05-21 NOTE — Progress Notes (Signed)
Renal function and electrolyte stable,

## 2017-05-27 ENCOUNTER — Ambulatory Visit (HOSPITAL_COMMUNITY)
Admission: RE | Admit: 2017-05-27 | Discharge: 2017-05-27 | Disposition: A | Discharge status: Home / Self Care | Payer: Medicare Other | Source: Ambulatory Visit | Attending: Cardiology | Admitting: Cardiology

## 2017-05-27 ENCOUNTER — Ambulatory Visit (HOSPITAL_BASED_OUTPATIENT_CLINIC_OR_DEPARTMENT_OTHER): Payer: Medicare Other

## 2017-05-27 ENCOUNTER — Other Ambulatory Visit: Payer: Self-pay | Admitting: *Deleted

## 2017-05-27 ENCOUNTER — Telehealth: Payer: Self-pay | Admitting: *Deleted

## 2017-05-27 VITALS — BP 120/75 | HR 98 | Temp 98.3°F | Resp 20

## 2017-05-27 DIAGNOSIS — C8332 Diffuse large B-cell lymphoma, intrathoracic lymph nodes: Secondary | ICD-10-CM

## 2017-05-27 DIAGNOSIS — I6529 Occlusion and stenosis of unspecified carotid artery: Secondary | ICD-10-CM | POA: Diagnosis not present

## 2017-05-27 DIAGNOSIS — Z95828 Presence of other vascular implants and grafts: Secondary | ICD-10-CM

## 2017-05-27 DIAGNOSIS — C8330 Diffuse large B-cell lymphoma, unspecified site: Secondary | ICD-10-CM

## 2017-05-27 DIAGNOSIS — I6523 Occlusion and stenosis of bilateral carotid arteries: Secondary | ICD-10-CM | POA: Insufficient documentation

## 2017-05-27 LAB — CBC WITH DIFFERENTIAL (CANCER CENTER ONLY)
BASO#: 0.1 10*3/uL (ref 0.0–0.2)
BASO%: 0.7 % (ref 0.0–2.0)
EOS ABS: 0 10*3/uL (ref 0.0–0.5)
EOS%: 0.6 % (ref 0.0–7.0)
HCT: 34.4 % — ABNORMAL LOW (ref 38.7–49.9)
HGB: 11.3 g/dL — ABNORMAL LOW (ref 13.0–17.1)
LYMPH#: 0.4 10*3/uL — ABNORMAL LOW (ref 0.9–3.3)
LYMPH%: 5.9 % — AB (ref 14.0–48.0)
MCH: 29.4 pg (ref 28.0–33.4)
MCHC: 32.8 g/dL (ref 32.0–35.9)
MCV: 89 fL (ref 82–98)
MONO#: 0.7 10*3/uL (ref 0.1–0.9)
MONO%: 10.2 % (ref 0.0–13.0)
NEUT#: 6 10*3/uL (ref 1.5–6.5)
NEUT%: 82.6 % — ABNORMAL HIGH (ref 40.0–80.0)
PLATELETS: 207 10*3/uL (ref 145–400)
RBC: 3.85 10*6/uL — AB (ref 4.20–5.70)
RDW: 17.1 % — ABNORMAL HIGH (ref 11.1–15.7)
WBC: 7.3 10*3/uL (ref 4.0–10.0)

## 2017-05-27 LAB — CMP (CANCER CENTER ONLY)
ALBUMIN: 3.5 g/dL (ref 3.3–5.5)
ALT(SGPT): 21 U/L (ref 10–47)
AST: 20 U/L (ref 11–38)
Alkaline Phosphatase: 122 U/L — ABNORMAL HIGH (ref 26–84)
BUN: 11 mg/dL (ref 7–22)
CHLORIDE: 101 meq/L (ref 98–108)
CO2: 27 meq/L (ref 18–33)
CREATININE: 0.9 mg/dL (ref 0.6–1.2)
Calcium: 9.4 mg/dL (ref 8.0–10.3)
Glucose, Bld: 143 mg/dL — ABNORMAL HIGH (ref 73–118)
POTASSIUM: 3.1 meq/L — AB (ref 3.3–4.7)
SODIUM: 139 meq/L (ref 128–145)
TOTAL PROTEIN: 6.7 g/dL (ref 6.4–8.1)
Total Bilirubin: 0.8 mg/dl (ref 0.20–1.60)

## 2017-05-27 MED ORDER — SODIUM CHLORIDE 0.9% FLUSH
10.0000 mL | INTRAVENOUS | Status: DC | PRN
  Administered 2017-05-27: 10 mL via INTRAVENOUS
  Filled 2017-05-27: qty 10

## 2017-05-27 MED ORDER — HEPARIN SOD (PORK) LOCK FLUSH 100 UNIT/ML IV SOLN
500.0000 [IU] | Freq: Once | INTRAVENOUS | Status: AC
  Administered 2017-05-27: 500 [IU] via INTRAVENOUS
  Filled 2017-05-27: qty 5

## 2017-05-27 NOTE — Progress Notes (Signed)
Pt states "fever and chills" since Saturday.  Temp 100.5 today per pt.  Pt also states that he has had diarrhea x4 this morning.  Pt here for lab work via port. Pt is drinking gatorade and water without difficulty.  Denies any nausea or vomiting. CBC results reviewed with [place] NP and pt OK to be discharged to home.  Pt instructed to continue to push PO fluids and rest.  Pt verbalizes an understanding of all instructions.

## 2017-05-27 NOTE — Telephone Encounter (Signed)
Patient c/o fever and diarrhea. Patient states he started feeling poorly on Saturday. He has had low grade temps since then, tmax today at 100.5. He's also had chills. He states this morning he had diarrhea once, took some imodium and he's not had another loose stool since.   He denies other symptoms. Nothing respiratory or urinary. 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 His last chemo was 05/09/2017.  Reviewed with Dr Marin Olp. He would like patient to come in and get his blood work checked. Appointment made. Patient aware.

## 2017-05-30 ENCOUNTER — Other Ambulatory Visit (HOSPITAL_BASED_OUTPATIENT_CLINIC_OR_DEPARTMENT_OTHER): Payer: Medicare Other

## 2017-05-30 ENCOUNTER — Other Ambulatory Visit: Payer: Self-pay

## 2017-05-30 ENCOUNTER — Ambulatory Visit (HOSPITAL_BASED_OUTPATIENT_CLINIC_OR_DEPARTMENT_OTHER): Payer: Medicare Other

## 2017-05-30 ENCOUNTER — Ambulatory Visit: Payer: Medicare Other

## 2017-05-30 ENCOUNTER — Ambulatory Visit (HOSPITAL_BASED_OUTPATIENT_CLINIC_OR_DEPARTMENT_OTHER): Payer: Medicare Other | Admitting: Family

## 2017-05-30 VITALS — BP 117/59 | HR 84 | Temp 98.2°F | Resp 20 | Wt 252.0 lb

## 2017-05-30 DIAGNOSIS — C8330 Diffuse large B-cell lymphoma, unspecified site: Secondary | ICD-10-CM

## 2017-05-30 DIAGNOSIS — R197 Diarrhea, unspecified: Secondary | ICD-10-CM

## 2017-05-30 DIAGNOSIS — K8689 Other specified diseases of pancreas: Secondary | ICD-10-CM

## 2017-05-30 DIAGNOSIS — A084 Viral intestinal infection, unspecified: Secondary | ICD-10-CM

## 2017-05-30 DIAGNOSIS — C8332 Diffuse large B-cell lymphoma, intrathoracic lymph nodes: Secondary | ICD-10-CM

## 2017-05-30 DIAGNOSIS — M7989 Other specified soft tissue disorders: Secondary | ICD-10-CM

## 2017-05-30 DIAGNOSIS — Z95828 Presence of other vascular implants and grafts: Secondary | ICD-10-CM

## 2017-05-30 LAB — CMP (CANCER CENTER ONLY)
ALT(SGPT): 41 U/L (ref 10–47)
AST: 31 U/L (ref 11–38)
Albumin: 3.4 g/dL (ref 3.3–5.5)
Alkaline Phosphatase: 107 U/L — ABNORMAL HIGH (ref 26–84)
BUN: 12 mg/dL (ref 7–22)
CALCIUM: 8.6 mg/dL (ref 8.0–10.3)
CHLORIDE: 99 meq/L (ref 98–108)
CO2: 26 meq/L (ref 18–33)
Creat: 1 mg/dl (ref 0.6–1.2)
GLUCOSE: 139 mg/dL — AB (ref 73–118)
POTASSIUM: 3.4 meq/L (ref 3.3–4.7)
Sodium: 142 mEq/L (ref 128–145)
Total Bilirubin: 0.7 mg/dl (ref 0.20–1.60)
Total Protein: 6.7 g/dL (ref 6.4–8.1)

## 2017-05-30 LAB — CBC WITH DIFFERENTIAL (CANCER CENTER ONLY)
BASO#: 0 10*3/uL (ref 0.0–0.2)
BASO%: 0.6 % (ref 0.0–2.0)
EOS ABS: 0 10*3/uL (ref 0.0–0.5)
EOS%: 0.5 % (ref 0.0–7.0)
HEMATOCRIT: 34.4 % — AB (ref 38.7–49.9)
HGB: 11.2 g/dL — ABNORMAL LOW (ref 13.0–17.1)
LYMPH#: 0.7 10*3/uL — AB (ref 0.9–3.3)
LYMPH%: 10.2 % — AB (ref 14.0–48.0)
MCH: 28.9 pg (ref 28.0–33.4)
MCHC: 32.6 g/dL (ref 32.0–35.9)
MCV: 89 fL (ref 82–98)
MONO#: 1.1 10*3/uL — AB (ref 0.1–0.9)
MONO%: 16.5 % — ABNORMAL HIGH (ref 0.0–13.0)
NEUT#: 4.7 10*3/uL (ref 1.5–6.5)
NEUT%: 72.2 % (ref 40.0–80.0)
PLATELETS: 221 10*3/uL (ref 145–400)
RBC: 3.88 10*6/uL — ABNORMAL LOW (ref 4.20–5.70)
RDW: 16.9 % — AB (ref 11.1–15.7)
WBC: 6.5 10*3/uL (ref 4.0–10.0)

## 2017-05-30 LAB — LACTATE DEHYDROGENASE: LDH: 274 U/L — AB (ref 125–245)

## 2017-05-30 MED ORDER — PANCRELIPASE (LIP-PROT-AMYL) 36000-114000 UNITS PO CPEP: 72000.0000 [IU] | ORAL_CAPSULE | Freq: Three times a day (TID) | ORAL | 2 refills | Status: DC

## 2017-05-30 MED ORDER — HEPARIN SOD (PORK) LOCK FLUSH 100 UNIT/ML IV SOLN
500.0000 [IU] | Freq: Once | INTRAVENOUS | Status: AC
  Administered 2017-05-30: 500 [IU] via INTRAVENOUS
  Filled 2017-05-30: qty 5

## 2017-05-30 MED ORDER — SODIUM CHLORIDE 0.9% FLUSH
10.0000 mL | INTRAVENOUS | Status: DC | PRN
  Administered 2017-05-30: 10 mL via INTRAVENOUS
  Filled 2017-05-30: qty 10

## 2017-05-30 NOTE — Addendum Note (Signed)
Addended by: [place] Morelle on: 05/30/2017 09:35 AM   Modules accepted: Orders, SmartSet

## 2017-05-30 NOTE — Progress Notes (Signed)
Hematology and Oncology Follow Up Visit  Richard [name] [id number] 1941-05-17 76 y.o. 05/30/2017   Principle Diagnosis:  Diffuse large cell non-Hodgkin's lymphoma (IPI = 3) - NOT "double hit" Pernicious anemia  Current Therapy:   R-CHOP - s/p cycle 4 Vitamin B12 1 mg IM every month Xgeva 120 mg subcu q 3 months   Interim History:  Richard [name] is here today with his wife for follow-up and treatment. He has had 6 episodes of diarrhea this morning and had a fever of 100.5 at 2 am. His temp right now is 98.2.  He has been taking 1 imodium a day but will increase this per package instructions.  He states that he is hydrating well with fluids and soups. He denies feeling dehydrated and does not want IV fluids today.  He states that his granddaughter was sick on Thanksgiving and he may have picked something up from her.  No chills, n/v, cough, rash, dizziness, SOB, chest pain, palpitations, abdominal pain or changes in bladder habits.  The swelling in his feet and ankles has improved since his Lasix dose was increased earlier this month. No tenderness, numbness or tingling in his extremities at this time.  He follows up with cardiology next week on Thursday to discuss his future valve replacement.  He still has the left axilla seroma and will have this removed once he has finished chemo.   ECOG Performance Status: 1 - Symptomatic but completely ambulatory  Medications:  Allergies as of 05/30/2017      Reactions   Benazepril Swelling   angioedema; he is not a candidate for any angiotensin receptor blockers because of this significant allergic reaction. Because of a history of documented adverse serious drug reaction;Medi Alert bracelet  is recommended   Hctz [hydrochlorothiazide] Anaphylaxis, Swelling   Tongue and lip swelling   Aspirin Other (See Comments)   Gastritis, cant take 325 Mg aspirin    Lactose Intolerance (gi) Nausea And Vomiting      Medication List        Accurate as  of 05/30/17  8:48 AM. Always use your most recent med list.          allopurinol 300 MG tablet Commonly known as:  ZYLOPRIM Take 450 mg by mouth daily.   amLODipine 5 MG tablet Commonly known as:  NORVASC TAKE 2 TABLETS (10 MG TOTAL) BY MOUTH DAILY.   aspirin 81 MG tablet Take 81 mg by mouth daily.   atorvastatin 80 MG tablet Commonly known as:  LIPITOR TAKE ONE TABLET BY MOUTH AT BEDTIME   diphenoxylate-atropine 2.5-0.025 MG tablet Commonly known as:  LOMOTIL Take 2 pills at the onset of diarrhea, then take one pill after every loose stool. Maximum 8 pills per 24h.   esomeprazole 40 MG capsule Commonly known as:  NEXIUM Take 1 capsule (40 mg total) by mouth daily.   ezetimibe 10 MG tablet Commonly known as:  ZETIA TAKE ONE TABLET BY MOUTH DAILY   famciclovir 500 MG tablet Commonly known as:  FAMVIR Take 1 tablet (500 mg total) by mouth daily.   fenofibrate 160 MG tablet TAKE 1 TABLET BY MOUTH EVERY DAY   fluticasone 50 MCG/ACT nasal spray Commonly known as:  FLONASE INHALE ONE SPRAY INTO EACH NOSTRIL TWICE DAILY.   freestyle lancets Use BID to check blood sugar.  DX E11.9   furosemide 40 MG tablet Commonly known as:  LASIX Take 3 tablets (120 mg total) 2 (two) times daily by mouth.   glimepiride  2 MG tablet Commonly known as:  AMARYL TAKE 1 TABLET (2 MG TOTAL) BY MOUTH DAILY WITH BREAKFAST.   glucose 5 g chewable tablet Chew 15 g by mouth as needed for low blood sugar.   glucose blood test strip Commonly known as:  FREESTYLE LITE TEST BLOOD SUGAR bid   ICY HOT EX Apply 1 application topically daily as needed (muscle pain).   lidocaine-prilocaine cream Commonly known as:  EMLA Apply to affected area once   LORazepam 0.5 MG tablet Commonly known as:  ATIVAN Take 1 tablet (0.5 mg total) by mouth every 6 (six) hours as needed (Nausea or vomiting).   metFORMIN 1000 MG tablet Commonly known as:  GLUCOPHAGE TAKE ONE AND ONE-HALF TABLETS BY MOUTH  EVERY MORNING AND 1 TALBET IN THE EVENING.   metoprolol tartrate 25 MG tablet Commonly known as:  LOPRESSOR TAKE ONE-HALF TABLET BY MOUTH TWICE DAILY   montelukast 10 MG tablet Commonly known as:  SINGULAIR Take 1 tablet (10 mg total) by mouth at bedtime.   nitroGLYCERIN 0.4 MG SL tablet Commonly known as:  NITROSTAT Place 1 tablet (0.4 mg total) under the tongue every 5 (five) minutes as needed for chest pain.   ondansetron 8 MG tablet Commonly known as:  ZOFRAN Take 1 tablet (8 mg total) by mouth 2 (two) times daily as needed for refractory nausea / vomiting. Start on day 3 after cyclophosphamide chemotherapy.   polycarbophil 625 MG tablet Commonly known as:  FIBERCON Take 625 mg by mouth daily.   potassium chloride SA 20 MEQ tablet Commonly known as:  KLOR-CON M20 Take 2 tablets (40 mEq total) 2 (two) times daily by mouth.   predniSONE 20 MG tablet Commonly known as:  DELTASONE Take 3 pills a day, with food, for 5 days. Start the day after each chemotherapy   pregabalin 75 MG capsule Commonly known as:  LYRICA Take 1 capsule (75 mg total) by mouth 3 (three) times daily.   prochlorperazine 10 MG tablet Commonly known as:  COMPAZINE Take 1 tablet (10 mg total) by mouth every 6 (six) hours as needed (Nausea or vomiting).       Allergies:  Allergies  Allergen Reactions  . Benazepril Swelling    angioedema; he is not a candidate for any angiotensin receptor blockers because of this significant allergic reaction. Because of a history of documented adverse serious drug reaction;Medi Alert bracelet  is recommended  . Hctz [Hydrochlorothiazide] Anaphylaxis and Swelling    Tongue and lip swelling   . Aspirin Other (See Comments)    Gastritis, cant take 325 Mg aspirin   . Lactose Intolerance (Gi) Nausea And Vomiting    Past Medical History, Surgical history, Social history, and Family History were reviewed and updated.  Review of Systems: All other 10 point review of  systems is negative.   Physical Exam:  weight is 252 lb (114.3 kg). His oral temperature is 98.2 F (36.8 C). His blood pressure is 117/59 (abnormal) and his pulse is 84. His respiration is 20 and oxygen saturation is 93%.   Wt Readings from Last 3 Encounters:  05/30/17 252 lb (114.3 kg)  05/13/17 273 lb 6.4 oz (124 kg)  05/09/17 257 lb (116.6 kg)    Ocular: Sclerae unicteric, pupils equal, round and reactive to light Ear-nose-throat: Oropharynx clear, dentition fair Lymphatic: No cervical, supraclavicular or axillary adenopathy Lungs no rales or rhonchi, good excursion bilaterally Heart regular rate and rhythm, no murmur appreciated Abd soft, nontender, positive bowel sounds, no  liver or spleen tip palpated on exam, no fluid wave  MSK no focal spinal tenderness, no joint edema Neuro: non-focal, well-oriented, appropriate affect Breasts: Deferred   Lab Results  Component Value Date   WBC 6.5 05/30/2017   HGB 11.2 (L) 05/30/2017   HCT 34.4 (L) 05/30/2017   MCV 89 05/30/2017   PLT 221 05/30/2017   Lab Results  Component Value Date   FERRITIN 557 (H) 02/12/2017   IRON 54 02/12/2017   TIBC 306 02/12/2017   UIBC 252 02/12/2017   IRONPCTSAT 18 (L) 02/12/2017   Lab Results  Component Value Date   RETICCTPCT 1.1 11/22/2011   RBC 3.88 (L) 05/30/2017   RETICCTABS 52.1 11/22/2011   No results found for: KPAFRELGTCHN, LAMBDASER, KAPLAMBRATIO Lab Results  Component Value Date   IGA 159 03/09/2012   No results found for: Ronnald Ramp, A1GS, A2GS, Tillman Sers, SPEI   Chemistry      Component Value Date/Time   NA 139 05/27/2017 1529   K 3.1 (L) 05/27/2017 1529   CL 101 05/27/2017 1529   CO2 27 05/27/2017 1529   BUN 11 05/27/2017 1529   CREATININE 0.9 05/27/2017 1529      Component Value Date/Time   CALCIUM 9.4 05/27/2017 1529   ALKPHOS 122 (H) 05/27/2017 1529   AST 20 05/27/2017 1529   ALT 21 05/27/2017 1529   BILITOT 0.80 05/27/2017 1529        Impression and Plan: Mr. Leppo is a very pleasant 76 yo caucasian gentleman with diffuse large B-cell lymphoma (not double hit lymphoma). He has a stomach virus right now with intermittent fever and diarrhea.  He denies feeling dehydrated and does not want fluids today.  We will hold treatment and plan to see him back again in another week.  He will increase his imodium per package instructions. We will also have him start taking Creon with meals and snacks.  He promises to contact our office with any questions or concerns. We can certainly see him sooner if need be. He will go to the ED in the event of an emergency.      Eliezer Bottom, NP 11/30/20188:48 AM

## 2017-06-05 ENCOUNTER — Ambulatory Visit (INDEPENDENT_AMBULATORY_CARE_PROVIDER_SITE_OTHER): Payer: Medicare Other | Admitting: Physician Assistant

## 2017-06-05 VITALS — BP 124/60 | HR 60 | Ht 72.0 in | Wt 255.0 lb

## 2017-06-05 DIAGNOSIS — E785 Hyperlipidemia, unspecified: Secondary | ICD-10-CM | POA: Diagnosis not present

## 2017-06-05 DIAGNOSIS — I25810 Atherosclerosis of coronary artery bypass graft(s) without angina pectoris: Secondary | ICD-10-CM

## 2017-06-05 DIAGNOSIS — Z9989 Dependence on other enabling machines and devices: Secondary | ICD-10-CM | POA: Diagnosis not present

## 2017-06-05 DIAGNOSIS — I350 Nonrheumatic aortic (valve) stenosis: Secondary | ICD-10-CM

## 2017-06-05 DIAGNOSIS — C8330 Diffuse large B-cell lymphoma, unspecified site: Secondary | ICD-10-CM | POA: Diagnosis not present

## 2017-06-05 DIAGNOSIS — I10 Essential (primary) hypertension: Secondary | ICD-10-CM | POA: Diagnosis not present

## 2017-06-05 DIAGNOSIS — G4733 Obstructive sleep apnea (adult) (pediatric): Secondary | ICD-10-CM | POA: Diagnosis not present

## 2017-06-05 DIAGNOSIS — E119 Type 2 diabetes mellitus without complications: Secondary | ICD-10-CM | POA: Diagnosis not present

## 2017-06-05 DIAGNOSIS — I6529 Occlusion and stenosis of unspecified carotid artery: Secondary | ICD-10-CM

## 2017-06-05 NOTE — Patient Instructions (Signed)
Medication Instructions:  CONTINUE CURRENT DIURETIC DOSING  If you need a refill on your cardiac medications before your next appointment, please call your pharmacy.  Follow-Up: Your physician wants you to follow-up in: [place].   Thank you for choosing [facility] at [facility]!!

## 2017-06-05 NOTE — Progress Notes (Signed)
Cardiology Office Note    Date:  06/07/2017   ID:  [name], DOB 12/17/1940, MRN [id number]  PCP:  Richard Held, DO  Cardiologist:  Dr. Percival Richard [name]   Chief Complaint  Patient presents with  . Follow-up    seen for Dr. Percival Richard [name]    History of Present Illness:  Richard Richard [name] is a 76 y.o. male  with CAD s/p CABG, GERD, HTN, HLD, OSA on CPAP and DM II.His last cardiac catheterization on 03/16/2014 showed 100% mid LAD chronic occlusion, patent LIMA to distal LAD, diffuse 90% stenosis in OM1, occluded OM 2, patent sequential SVG to OM1/OM 2, 99% proximal RCA stenosis with 100% mid RCA occlusion followed by a patent graft to RCA. He does have occluded SVG to diagonal branch. He had a history of bradycardia and was taken off his beta blocker. He was last seen by Dr. Magnus [name] 01/03/2017, he says occasional a history of heart rate got down to the 30s, but he does not have any presyncope or syncope. He was given a 24-hour Holter monitor and plan for repeat echocardiogram.Holter monitor showed occasional transient 2-1 heart block, he is on low-dose beta blocker, however he is asymptomatic, therefore no medication adjustment was made. As far as echocardiogram, it does show severe aortic stenosis.  He was diagnosed with diffuse large cell non-Hodgkin's lymphoma through biopsy of the left axillary lymph node in August 2018. He underwent Port-A-Cath placement on 03/06/2017 and was subsequently started on chemotherapy.  I last saw the patient on 05/13/2017, he had 3+ pitting edema in bilateral lower extremity.  I increased his Lasix to 120 mg twice daily.  I also increase his potassium supplement to 40 mEq twice daily as well.  Repeat echocardiogram obtained on 05/16/2017 showed EF 55-60%, severe aortic stenosis with mean gradient 54 mmHg, mildly dilated a sending aorta, mild MR.  He presents today for cardiology office visit.  He continues to have dyspnea on exertion and occasional chest discomfort  or with exertion after he walk a certain distance.  His lower extremity edema has significantly improved and is now only 1+ in degree.  He denies any dizziness or feeling of passing out recently with walking.  Given the fact that echocardiogram showed his severe aortic stenosis likely has progressed, I discussed his case with Dr. Percival Richard [name], he will see Dr. Percival Richard [name] in 6 weeks for follow-up and potentially discuss TAVR.  I briefly discussed with the patient the differences between traditional valve repair and a TAVR.  Given his history of CABG, he likely will proceed with TAVR instead.  Recent MRI shows lymphoma is in remission.  He is going to finish chemotherapy in early February.  I asked the patient to contact us if he has significant change in his symptom or start having dizziness or feeling of passing out.  I did not increase his metoprolol due to prior history of 2-1 heart block on Holter monitor.  He was recently started on Creon for pancreatic insufficiency.  He also noted to have occasional hypoglycemic episode after glimepiride was increased in dosage.  He will need to discuss this with his primary care provider.   Past Medical History:  Diagnosis Date  . Adenomatous colon polyp 05/01/2015   tubular  . Anemia   . Antritis (stomach)    mild  . Anxiety   . Aortic stenosis   . Arthritis    "knees, hips, shoulders" (03/15/2014)  . Axillary adenopathy 02/25/2017  . CAD (coronary artery  disease)    bypass 2001  . Chronic lower back pain   . Diverticulosis    mild, left colon  . Duodenitis    chronic  . Esophageal stricture    X 4  . GERD (gastroesophageal reflux disease)   . Gout   . Heart murmur    Echo in july 2018  . History of gout   . HNP (herniated nucleus pulposus), lumbar   . HTN (hypertension)   . Iron deficiency   . Lymphoma ([place])   . Migraines    "til age 83 yrs"  . Mixed hyperlipidemia   . OSA on CPAP    with 2L O2 at night  . Peptic stricture of esophagus   . Skin  cancer    "right neck cut off; several frozen off my arms" (03/15/2014)  . Sliding hiatal hernia   . Spinal stenosis   . Type II diabetes mellitus ([place])   . Vitamin B12 deficiency   . Wenckebach block    a. brady down to 30s due to frequent block, metoprolol Richard [name] in response.    Past Surgical History:  Procedure Laterality Date  . APPENDECTOMY  ~ 1952  . BACK SURGERY    . CATARACT EXTRACTION W/PHACO Left 11/06/2016   Procedure: CATARACT EXTRACTION PHACO AND INTRAOCULAR LENS PLACEMENT (IOC);  Surgeon: Estill Cotta, MD;  Location: [facility];  Service: Ophthalmology;  Laterality: Left;  Lot # [id number] H [place]: 01:09.4 AP%:25.2 CDE: 30.64  . CATARACT EXTRACTION W/PHACO Right 12/04/2016   Procedure: CATARACT EXTRACTION PHACO AND INTRAOCULAR LENS PLACEMENT (IOC);  Surgeon: Estill Cotta, MD;  Location: [facility];  Service: Ophthalmology;  Laterality: Right;  [place] 1:25.9 AP% 24.1 CDE 39.10 Fluid Pack lot # [id number]  . COLONOSCOPY W/ BIOPSIES AND POLYPECTOMY  2013  . CORONARY ANGIOPLASTY  1993  . CORONARY ANGIOPLASTY WITH STENT PLACEMENT  05/1997   "1"  . CORONARY ARTERY BYPASS GRAFT  03/2000   "CABG X5"  . ESOPHAGEAL DILATION  X 3-4   Dr. Lyla Son; "last one was in the 1990's"  . ESOPHAGOGASTRODUODENOSCOPY     multiple  . FLEXIBLE SIGMOIDOSCOPY     multiple  . HYDRADENITIS EXCISION Left 02/25/2017   Procedure: EXCISION DEEP LEFT AXILLARY LYMPH NODE;  Surgeon: Fanny Skates, MD;  Location: [place];  Service: General;  Laterality: Left;  . KNEE ARTHROSCOPY Left 2011   meniscus repair  . LEFT HEART CATHETERIZATION WITH CORONARY/GRAFT ANGIOGRAM N/A 03/16/2014   Procedure: LEFT HEART CATHETERIZATION WITH Beatrix Fetters;  Surgeon: Burnell Blanks, MD;  Location: [facility] CATH LAB;  Service: Cardiovascular;  Laterality: N/A;  . LUMBAR LAMINECTOMY/DECOMPRESSION MICRODISCECTOMY Right 06/17/2013   Procedure: LUMBAR LAMINECTOMY MICRODISCECTOMY L4-L5 RIGHT EXCISION OF SYNOVIAL CYST  RIGHT   (1 LEVEL) RIGHT PARTIAL FACETECTOMY;  Surgeon: Tobi Bastos, MD;  Location: [facility];  Service: Orthopedics;  Laterality: Right;  . MYELOGRAM  04/06/13   lumbar, Dr Gladstone Lighter  . PANENDOSCOPY    . PORTACATH PLACEMENT N/A 03/06/2017   Procedure: INSERTION PORT-A-CATH AND ASPIRATE SEROMA LEFT AXILLA;  Surgeon: Fanny Skates, MD;  Location: [place];  Service: General;  Laterality: N/A;  . SHOULDER SURGERY Right 08/2010   screws placed; "tendons tore off"  . SKIN CANCER EXCISION Right    "neck"  . TONSILLECTOMY  ~ 1954  . UPPER GI ENDOSCOPY  2013   Gastritis; Dr Carlean Purl  . VASECTOMY      Current Medications: Outpatient Medications Prior to Visit  Medication Sig Dispense Refill  . allopurinol (  ZYLOPRIM) 300 MG tablet Take 450 mg by mouth daily.     [name] amLODipine (NORVASC) 5 MG tablet TAKE 2 TABLETS (10 MG TOTAL) BY MOUTH DAILY. (Patient taking differently: TAKE 1 TABLETS BY MOUTH TWICE DAILY.) 60 tablet 11  . aspirin 81 MG tablet Take 81 mg by mouth daily.    [name] atorvastatin (LIPITOR) 80 MG tablet TAKE ONE TABLET BY MOUTH AT BEDTIME 90 tablet 3  . diphenoxylate-atropine (LOMOTIL) 2.5-0.025 MG tablet Take 2 pills at the onset of diarrhea, then take one pill after every loose stool. Maximum 8 pills per 24h. 60 tablet 0  . esomeprazole (NEXIUM) 40 MG capsule Take 1 capsule (40 mg total) by mouth daily. 30 capsule 5  . ezetimibe (ZETIA) 10 MG tablet TAKE ONE TABLET BY MOUTH DAILY 90 tablet 3  . famciclovir (FAMVIR) 500 MG tablet Take 1 tablet (500 mg total) by mouth daily. 30 tablet 12  . fluticasone (FLONASE) 50 MCG/ACT nasal spray INHALE ONE SPRAY INTO EACH NOSTRIL TWICE DAILY. 16 g 6  . furosemide (LASIX) 40 MG tablet Take 3 tablets (120 mg total) 2 (two) times daily by mouth. 180 tablet 5  . glimepiride (AMARYL) 2 MG tablet TAKE 1 TABLET (2 MG TOTAL) BY MOUTH DAILY WITH BREAKFAST. 90 tablet 0  . glucose 5 g chewable tablet Chew 15 g by mouth as needed for low blood sugar.    [name] glucose  blood (FREESTYLE LITE) test strip TEST BLOOD SUGAR bid 100 each 11  . Lancets (FREESTYLE) lancets Use BID to check blood sugar.  DX E11.9 100 each 6  . lidocaine-prilocaine (EMLA) cream Apply to affected area once 30 g 3  . lipase/protease/amylase (CREON) 36000 UNITS CPEP capsule Take 2 capsules (72,000 Units total) by mouth 3 (three) times daily with meals. 1 capsule with snack. 210 capsule 2  . LORazepam (ATIVAN) 0.5 MG tablet Take 1 tablet (0.5 mg total) by mouth every 6 (six) hours as needed (Nausea or vomiting). (Patient not taking: Reported on 06/06/2017) 30 tablet 0  . Menthol, Topical Analgesic, (ICY HOT EX) Apply 1 application topically daily as needed (muscle pain).    . metFORMIN (GLUCOPHAGE) 1000 MG tablet TAKE ONE AND ONE-HALF TABLETS BY MOUTH EVERY MORNING AND 1 TALBET IN THE EVENING. 225 tablet 1  . metoprolol tartrate (LOPRESSOR) 25 MG tablet TAKE ONE-HALF TABLET BY MOUTH TWICE DAILY (Patient taking differently: TAKE ONE-HALF TABLET BY MOUTH IN THE MORNING AND HALF TABLET IN THE EVENING) 90 tablet 3  . nitroGLYCERIN (NITROSTAT) 0.4 MG SL tablet Place 1 tablet (0.4 mg total) under the tongue every 5 (five) minutes as needed for chest pain. 25 tablet 3  . ondansetron (ZOFRAN) 8 MG tablet Take 1 tablet (8 mg total) by mouth 2 (two) times daily as needed for refractory nausea / vomiting. Start on day 3 after cyclophosphamide chemotherapy. (Patient not taking: Reported on 06/06/2017) 30 tablet 1  . polycarbophil (FIBERCON) 625 MG tablet Take 625 mg by mouth daily.    . potassium chloride SA (KLOR-CON M20) 20 MEQ tablet Take 2 tablets (40 mEq total) 2 (two) times daily by mouth. 120 tablet 5  . predniSONE (DELTASONE) 20 MG tablet Take 3 pills a day, with food, for 5 days. Start the day after each chemotherapy 120 tablet 0  . pregabalin (LYRICA) 75 MG capsule Take 1 capsule (75 mg total) by mouth 3 (three) times daily. 270 capsule 3  . prochlorperazine (COMPAZINE) 10 MG tablet Take 1 tablet (10  mg total)  by mouth every 6 (six) hours as needed (Nausea or vomiting). (Patient not taking: Reported on 06/06/2017) 30 tablet 6  . fenofibrate 160 MG tablet TAKE 1 TABLET BY MOUTH EVERY DAY 30 tablet 5  . montelukast (SINGULAIR) 10 MG tablet Take 1 tablet (10 mg total) by mouth at bedtime. 90 tablet 1   Facility-Administered Medications Prior to Visit  Medication Dose Route Frequency Provider Last Rate Last Dose  . sodium chloride flush (NS) 0.9 % injection 10 mL  10 mL Intravenous PRN [name], MD   10 mL at 05/30/17 0920     Allergies:   Benazepril; Hctz [hydrochlorothiazide]; Aspirin; and Lactose intolerance (gi)   Social History   Socioeconomic History  . Marital status: Divorced    Spouse name: None  . Number of children: 2  . Years of education: None  . Highest education level: None  Social Needs  . Financial resource strain: None  . Food insecurity - worry: None  . Food insecurity - inability: None  . Transportation needs - medical: None  . Transportation needs - non-medical: None  Occupational History    Employer: RETIRED  Tobacco Use  . Smoking status: Never Smoker  . Smokeless tobacco: Never Used  Substance and Sexual Activity  . Alcohol use: No    Alcohol/week: 0.0 oz    Comment: "last drink was in 2012"( 03/15/2014)  . Drug use: No  . Sexual activity: Not Currently  Other Topics Concern  . None  Social History Narrative   Divorced, lives with a roommate.   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     Family History:  The patient's family history includes Diabetes in his maternal grandmother; Heart attack in his paternal grandmother; Hypertension in his father; Pancreatic cancer in his mother; Stroke in his father and maternal grandmother.   ROS:   Please see the history of  present illness.    ROS All other systems reviewed and are negative.   PHYSICAL EXAM:   VS:  BP 124/60   Pulse 60   Ht 6' (1.829 m)   Wt 255 lb (115.7 kg)   BMI 34.58 kg/m    GEN: Well nourished, well developed, in no acute distress  HEENT: normal  Neck: no JVD, carotid bruits, or masses Cardiac: RRR; no rubs, or gallops. 1+ pitting edema, 3/6 systolic murmur Respiratory:  clear to auscultation bilaterally, normal work of breathing GI: soft, nontender, nondistended, + BS MS: no deformity or atrophy  Skin: warm and dry, no rash Neuro:  Alert and Oriented x 3, Strength and sensation are intact Psych: euthymic mood, full affect  Wt Readings from Last 3 Encounters:  06/06/17 252 lb 6 oz (114.5 kg)  06/05/17 255 lb (115.7 kg)  05/30/17 252 lb (114.3 kg)      Studies/Labs Reviewed:   EKG:  EKG is not ordered today.   Recent Labs: 06/06/2017: ALT(SGPT) 40; BUN, Bld 13; Creat 1.0; HGB 10.6; Platelets 274; Potassium 3.9; Sodium 144   Lipid Panel    Component Value Date/Time   CHOL 125 11/21/2016 1040   TRIG 195.0 (H) 11/21/2016 1040   HDL 29.20 (L) 11/21/2016 1040   CHOLHDL 4 11/21/2016 1040   VLDL 39.0 11/21/2016 1040   LDLCALC 57 11/21/2016 1040   LDLDIRECT 71.0 09/18/2015 0907  Additional studies/ records that were reviewed today include:   Echo 05/16/2017 LV EF: 55% -   60%  Study Conclusions  - HPI and indications: Severe Aortic Stenosis (prior Mean Gradient   60mmHg, 01-16-17) - Left ventricle: The cavity size was normal. Systolic function was   normal. The estimated ejection fraction was in the range of 55%   to 60%. Wall motion was normal; there were no regional wall   motion abnormalities. Doppler parameters are consistent with   abnormal left ventricular relaxation (grade 1 diastolic   dysfunction). GLS: -17.2% - Aortic valve: Valve mobility was restricted. There was severe   stenosis. There was trivial regurgitation. Peak velocity (S): 492   cm/s.  Mean gradient (S): 54 mm Hg. Valve area (VTI): 0.84 cm^2.   Valve area (Vmax): 0.83 cm^2. Valve area (Vmean): 0.82 cm^2. - Aorta: Ascending aortic diameter: 40 mm (S). - Ascending aorta: The ascending aorta was mildly dilated. - Mitral valve: Calcified annulus. There was mild regurgitation.   Valve area by continuity equation (using LVOT flow): 2 cm^2. - Left atrium: The atrium was severely dilated. Volume/bsa, ES   (1-plane Simpson&'s, A4C): 61.5 ml/m^2. - Right ventricle: The cavity size was moderately dilated. Wall   thickness was normal. - Right atrium: The atrium was severely dilated. - Tricuspid valve: There was mild regurgitation.  Impressions:  - Aortic stenosis has advanced.    ASSESSMENT:    1. Severe aortic stenosis   2. Coronary artery disease involving coronary bypass graft of native heart without angina pectoris   3. Essential hypertension   4. Hyperlipidemia, unspecified hyperlipidemia type   5. OSA on CPAP   6. Controlled type 2 diabetes mellitus without complication, without long-term current use of insulin ([place])   7. Diffuse large B-cell lymphoma, unspecified body region [facility])      PLAN:  In order of problems listed above:  1. Severe aortic stenosis: Case discussed with Dr. Percival Richard [name], he has had recent acute on chronic diastolic heart failure.  He also has baseline dyspnea on exertion as well.  Will need to be evaluated for aortic valve replacement.  Given his history of bypass surgery, likely TAVR.  According to the patient, he is finishing chemotherapy in early February.  6 weeks follow-up with Dr. Percival Richard [name] to discuss further surgery  2. CAD s/p CABG: Denies any chest pain  3. HTN: Blood pressure stable  4. HLD: Continue Lipitor 80 mg daily  5. DM II: On glimepiride, recently has been having some hypoglycemic episode after the dose increase, we will need to discuss was primary care provider.  6. Chronic diastolic heart failure: Symptoms improved  significantly after increasing diuretic.  Given severe aortic stenosis, will need to avoid overdiuresis as this may cause hypoperfusion of kidney  7. Diffuse large B-cell lymphoma: Managed by oncology, currently undergoing chemotherapy.    Medication Adjustments/Labs and Tests Ordered: Current medicines are reviewed at length with the patient today.  Concerns regarding medicines are outlined above.  Medication changes, Labs and Tests ordered today are listed in the Patient Instructions below. Patient Instructions  Medication Instructions:  CONTINUE CURRENT DIURETIC DOSING  If you need a refill on your cardiac medications before your next appointment, please call your pharmacy.  Follow-Up: Your physician wants you to follow-up in: [place].   Thank you for choosing [facility], [place], [place]  06/07/2017 8:41 AM    [facility] [place], [place], [place]  [address] Phone: [phone]; Fax: [phone]

## 2017-06-06 ENCOUNTER — Ambulatory Visit (HOSPITAL_BASED_OUTPATIENT_CLINIC_OR_DEPARTMENT_OTHER): Payer: Medicare Other | Admitting: Family

## 2017-06-06 ENCOUNTER — Other Ambulatory Visit (HOSPITAL_BASED_OUTPATIENT_CLINIC_OR_DEPARTMENT_OTHER): Payer: Medicare Other

## 2017-06-06 ENCOUNTER — Ambulatory Visit: Payer: Medicare Other

## 2017-06-06 ENCOUNTER — Other Ambulatory Visit: Payer: Self-pay

## 2017-06-06 ENCOUNTER — Ambulatory Visit (HOSPITAL_BASED_OUTPATIENT_CLINIC_OR_DEPARTMENT_OTHER): Payer: Medicare Other

## 2017-06-06 VITALS — BP 123/48 | HR 45 | Temp 97.8°F | Resp 20

## 2017-06-06 VITALS — BP 127/71 | HR 52 | Temp 97.9°F | Resp 20 | Wt 252.4 lb

## 2017-06-06 DIAGNOSIS — C8332 Diffuse large B-cell lymphoma, intrathoracic lymph nodes: Secondary | ICD-10-CM

## 2017-06-06 DIAGNOSIS — C8330 Diffuse large B-cell lymphoma, unspecified site: Secondary | ICD-10-CM | POA: Diagnosis not present

## 2017-06-06 DIAGNOSIS — Z5112 Encounter for antineoplastic immunotherapy: Secondary | ICD-10-CM | POA: Diagnosis not present

## 2017-06-06 DIAGNOSIS — Z5111 Encounter for antineoplastic chemotherapy: Secondary | ICD-10-CM | POA: Diagnosis not present

## 2017-06-06 LAB — LACTATE DEHYDROGENASE: LDH: 279 U/L — ABNORMAL HIGH (ref 125–245)

## 2017-06-06 LAB — CMP (CANCER CENTER ONLY)
ALBUMIN: 3.4 g/dL (ref 3.3–5.5)
ALT(SGPT): 40 U/L (ref 10–47)
AST: 27 U/L (ref 11–38)
Alkaline Phosphatase: 145 U/L — ABNORMAL HIGH (ref 26–84)
BILIRUBIN TOTAL: 0.7 mg/dL (ref 0.20–1.60)
BUN, Bld: 13 mg/dL (ref 7–22)
CO2: 28 meq/L (ref 18–33)
CREATININE: 1 mg/dL (ref 0.6–1.2)
Calcium: 9.3 mg/dL (ref 8.0–10.3)
Chloride: 104 mEq/L (ref 98–108)
Glucose, Bld: 105 mg/dL (ref 73–118)
Potassium: 3.9 mEq/L (ref 3.3–4.7)
SODIUM: 144 meq/L (ref 128–145)
TOTAL PROTEIN: 6.8 g/dL (ref 6.4–8.1)

## 2017-06-06 LAB — CBC WITH DIFFERENTIAL (CANCER CENTER ONLY)
BASO#: 0.1 10*3/uL (ref 0.0–0.2)
BASO%: 1.4 % (ref 0.0–2.0)
EOS%: 1.6 % (ref 0.0–7.0)
Eosinophils Absolute: 0.1 10*3/uL (ref 0.0–0.5)
HCT: 32.7 % — ABNORMAL LOW (ref 38.7–49.9)
HEMOGLOBIN: 10.6 g/dL — AB (ref 13.0–17.1)
LYMPH#: 0.8 10*3/uL — ABNORMAL LOW (ref 0.9–3.3)
LYMPH%: 13.1 % — ABNORMAL LOW (ref 14.0–48.0)
MCH: 29 pg (ref 28.0–33.4)
MCHC: 32.4 g/dL (ref 32.0–35.9)
MCV: 89 fL (ref 82–98)
MONO#: 1.1 10*3/uL — ABNORMAL HIGH (ref 0.1–0.9)
MONO%: 18.4 % — AB (ref 0.0–13.0)
NEUT%: 65.5 % (ref 40.0–80.0)
NEUTROS ABS: 3.8 10*3/uL (ref 1.5–6.5)
Platelets: 274 10*3/uL (ref 145–400)
RBC: 3.66 10*6/uL — AB (ref 4.20–5.70)
RDW: 17 % — ABNORMAL HIGH (ref 11.1–15.7)
WBC: 5.8 10*3/uL (ref 4.0–10.0)

## 2017-06-06 MED ORDER — DEXAMETHASONE SODIUM PHOSPHATE 10 MG/ML IJ SOLN
10.0000 mg | Freq: Once | INTRAMUSCULAR | Status: AC
  Administered 2017-06-06: 10 mg via INTRAVENOUS

## 2017-06-06 MED ORDER — VINCRISTINE SULFATE CHEMO INJECTION 1 MG/ML
2.0000 mg | Freq: Once | INTRAVENOUS | Status: AC
  Administered 2017-06-06: 2 mg via INTRAVENOUS
  Filled 2017-06-06: qty 2

## 2017-06-06 MED ORDER — PALONOSETRON HCL INJECTION 0.25 MG/5ML
INTRAVENOUS | Status: AC
  Filled 2017-06-06: qty 5

## 2017-06-06 MED ORDER — DEXAMETHASONE SODIUM PHOSPHATE 10 MG/ML IJ SOLN
INTRAMUSCULAR | Status: AC
  Filled 2017-06-06: qty 1

## 2017-06-06 MED ORDER — PALONOSETRON HCL INJECTION 0.25 MG/5ML
0.2500 mg | Freq: Once | INTRAVENOUS | Status: AC
  Administered 2017-06-06: 0.25 mg via INTRAVENOUS

## 2017-06-06 MED ORDER — SODIUM CHLORIDE 0.9% FLUSH
10.0000 mL | INTRAVENOUS | Status: DC | PRN
  Administered 2017-06-06: 10 mL
  Filled 2017-06-06: qty 10

## 2017-06-06 MED ORDER — SODIUM CHLORIDE 0.9 % IV SOLN
375.0000 mg/m2 | Freq: Once | INTRAVENOUS | Status: AC
  Administered 2017-06-06: 900 mg via INTRAVENOUS
  Filled 2017-06-06: qty 50

## 2017-06-06 MED ORDER — CYCLOPHOSPHAMIDE CHEMO INJECTION 1 GM
750.0000 mg/m2 | Freq: Once | INTRAMUSCULAR | Status: AC
  Administered 2017-06-06: 1820 mg via INTRAVENOUS
  Filled 2017-06-06: qty 91

## 2017-06-06 MED ORDER — DOXORUBICIN HCL CHEMO IV INJECTION 2 MG/ML
50.0000 mg/m2 | Freq: Once | INTRAVENOUS | Status: AC
  Administered 2017-06-06: 122 mg via INTRAVENOUS
  Filled 2017-06-06: qty 61

## 2017-06-06 MED ORDER — DIPHENHYDRAMINE HCL 25 MG PO CAPS
ORAL_CAPSULE | ORAL | Status: AC
  Filled 2017-06-06: qty 2

## 2017-06-06 MED ORDER — HEPARIN SOD (PORK) LOCK FLUSH 100 UNIT/ML IV SOLN
500.0000 [IU] | Freq: Once | INTRAVENOUS | Status: AC | PRN
  Administered 2017-06-06: 500 [IU]
  Filled 2017-06-06: qty 5

## 2017-06-06 MED ORDER — ACETAMINOPHEN 325 MG PO TABS
ORAL_TABLET | ORAL | Status: AC
  Filled 2017-06-06: qty 2

## 2017-06-06 MED ORDER — DIPHENHYDRAMINE HCL 25 MG PO CAPS
50.0000 mg | ORAL_CAPSULE | Freq: Once | ORAL | Status: AC
  Administered 2017-06-06: 50 mg via ORAL

## 2017-06-06 MED ORDER — SODIUM CHLORIDE 0.9 % IV SOLN
Freq: Once | INTRAVENOUS | Status: AC
  Administered 2017-06-06: 11:00:00 via INTRAVENOUS

## 2017-06-06 MED ORDER — ACETAMINOPHEN 325 MG PO TABS
650.0000 mg | ORAL_TABLET | Freq: Once | ORAL | Status: AC
  Administered 2017-06-06: 650 mg via ORAL

## 2017-06-06 NOTE — Progress Notes (Signed)
Hematology and Oncology Follow Up Visit  Richard [name] [id number] 06-06-1941 76 y.o. 06/06/2017   Principle Diagnosis:  Diffuse large cell non-Hodgkin's lymphoma (IPI = 3) - NOT "double hit" Pernicious anemia  Current Therapy:   R-CHOP - s/p cycle 4 Vitamin B12 1 mg IM every month Xgeva 120 mg subcu q 3 months   Interim History:  Richard [name] is here today for follow-up and treatment. He is feeling much better. He has not had diarrhea or a fever since Monday.  No lymphadenopathy noted on exam. He sees the surgeon next week for follow-up of the left axillary seroma.  No fever, chills, n/v, cough, rash, dizziness, SOB, chest pain, palpitations, abdominal pain or changes in bowel or bladder habits.  The swelling and neuropathy in his lower extremities is unchanged.  He has a good appetite and is staying well hydrated. His weight is stable. He has had 2 episodes of hypoglycemia at bedtime this week and is monitor this closely. He will follow-up with his PCP if this persists.   ECOG Performance Status: 1 - Symptomatic but completely ambulatory  Medications:  Allergies as of 06/06/2017      Reactions   Benazepril Swelling   angioedema; he is not a candidate for any angiotensin receptor blockers because of this significant allergic reaction. Because of a history of documented adverse serious drug reaction;Medi Alert bracelet  is recommended   Hctz [hydrochlorothiazide] Anaphylaxis, Swelling   Tongue and lip swelling   Aspirin Other (See Comments)   Gastritis, cant take 325 Mg aspirin    Lactose Intolerance (gi) Nausea And Vomiting      Medication List        Accurate as of 06/06/17 10:08 AM. Always use your most recent med list.          allopurinol 300 MG tablet Commonly known as:  ZYLOPRIM Take 450 mg by mouth daily.   amLODipine 5 MG tablet Commonly known as:  NORVASC TAKE 2 TABLETS (10 MG TOTAL) BY MOUTH DAILY.   aspirin 81 MG tablet Take 81 mg by mouth daily.     atorvastatin 80 MG tablet Commonly known as:  LIPITOR TAKE ONE TABLET BY MOUTH AT BEDTIME   diphenoxylate-atropine 2.5-0.025 MG tablet Commonly known as:  LOMOTIL Take 2 pills at the onset of diarrhea, then take one pill after every loose stool. Maximum 8 pills per 24h.   esomeprazole 40 MG capsule Commonly known as:  NEXIUM Take 1 capsule (40 mg total) by mouth daily.   ezetimibe 10 MG tablet Commonly known as:  ZETIA TAKE ONE TABLET BY MOUTH DAILY   famciclovir 500 MG tablet Commonly known as:  FAMVIR Take 1 tablet (500 mg total) by mouth daily.   fluticasone 50 MCG/ACT nasal spray Commonly known as:  FLONASE INHALE ONE SPRAY INTO EACH NOSTRIL TWICE DAILY.   freestyle lancets Use BID to check blood sugar.  DX E11.9   furosemide 40 MG tablet Commonly known as:  LASIX Take 3 tablets (120 mg total) 2 (two) times daily by mouth.   glimepiride 2 MG tablet Commonly known as:  AMARYL TAKE 1 TABLET (2 MG TOTAL) BY MOUTH DAILY WITH BREAKFAST.   glucose 5 g chewable tablet Chew 15 g by mouth as needed for low blood sugar.   glucose blood test strip Commonly known as:  FREESTYLE LITE TEST BLOOD SUGAR bid   ICY HOT EX Apply 1 application topically daily as needed (muscle pain).   lidocaine-prilocaine cream Commonly known as:  EMLA Apply to affected area once   lipase/protease/amylase 36000 UNITS Cpep capsule Commonly known as:  CREON Take 2 capsules (72,000 Units total) by mouth 3 (three) times daily with meals. 1 capsule with snack.   LORazepam 0.5 MG tablet Commonly known as:  ATIVAN Take 1 tablet (0.5 mg total) by mouth every 6 (six) hours as needed (Nausea or vomiting).   metFORMIN 1000 MG tablet Commonly known as:  GLUCOPHAGE TAKE ONE AND ONE-HALF TABLETS BY MOUTH EVERY MORNING AND 1 TALBET IN THE EVENING.   metoprolol tartrate 25 MG tablet Commonly known as:  LOPRESSOR TAKE ONE-HALF TABLET BY MOUTH TWICE DAILY   nitroGLYCERIN 0.4 MG SL tablet Commonly  known as:  NITROSTAT Place 1 tablet (0.4 mg total) under the tongue every 5 (five) minutes as needed for chest pain.   ondansetron 8 MG tablet Commonly known as:  ZOFRAN Take 1 tablet (8 mg total) by mouth 2 (two) times daily as needed for refractory nausea / vomiting. Start on day 3 after cyclophosphamide chemotherapy.   polycarbophil 625 MG tablet Commonly known as:  FIBERCON Take 625 mg by mouth daily.   potassium chloride SA 20 MEQ tablet Commonly known as:  KLOR-CON M20 Take 2 tablets (40 mEq total) 2 (two) times daily by mouth.   predniSONE 20 MG tablet Commonly known as:  DELTASONE Take 3 pills a day, with food, for 5 days. Start the day after each chemotherapy   pregabalin 75 MG capsule Commonly known as:  LYRICA Take 1 capsule (75 mg total) by mouth 3 (three) times daily.   prochlorperazine 10 MG tablet Commonly known as:  COMPAZINE Take 1 tablet (10 mg total) by mouth every 6 (six) hours as needed (Nausea or vomiting).       Allergies:  Allergies  Allergen Reactions  . Benazepril Swelling    angioedema; he is not a candidate for any angiotensin receptor blockers because of this significant allergic reaction. Because of a history of documented adverse serious drug reaction;Medi Alert bracelet  is recommended  . Hctz [Hydrochlorothiazide] Anaphylaxis and Swelling    Tongue and lip swelling   . Aspirin Other (See Comments)    Gastritis, cant take 325 Mg aspirin   . Lactose Intolerance (Gi) Nausea And Vomiting    Past Medical History, Surgical history, Social history, and Family History were reviewed and updated.  Review of Systems: All other 10 point review of systems is negative.   Physical Exam:  weight is 252 lb 6 oz (114.5 kg). His oral temperature is 97.9 F (36.6 C). His blood pressure is 127/71 and his pulse is 52 (abnormal). His respiration is 20 and oxygen saturation is 96%.   Wt Readings from Last 3 Encounters:  06/06/17 252 lb 6 oz (114.5 kg)    06/05/17 255 lb (115.7 kg)  05/30/17 252 lb (114.3 kg)    Ocular: Sclerae unicteric, pupils equal, round and reactive to light Ear-nose-throat: Oropharynx clear, dentition fair Lymphatic: No cervical, supraclavicular or axillary adenopathy Lungs no rales or rhonchi, good excursion bilaterally Heart regular rate and rhythm, no murmur appreciated Abd soft, nontender, positive bowel sounds, no liver or spleen tip palpated on exam, no fluid wave  MSK no focal spinal tenderness, no joint edema Neuro: non-focal, well-oriented, appropriate affect Breasts: Deferred   Lab Results  Component Value Date   WBC 5.8 06/06/2017   HGB 10.6 (L) 06/06/2017   HCT 32.7 (L) 06/06/2017   MCV 89 06/06/2017   PLT 274 06/06/2017  Lab Results  Component Value Date   FERRITIN 557 (H) 02/12/2017   IRON 54 02/12/2017   TIBC 306 02/12/2017   UIBC 252 02/12/2017   IRONPCTSAT 18 (L) 02/12/2017   Lab Results  Component Value Date   RETICCTPCT 1.1 11/22/2011   RBC 3.66 (L) 06/06/2017   RETICCTABS 52.1 11/22/2011   No results found for: KPAFRELGTCHN, LAMBDASER, KAPLAMBRATIO Lab Results  Component Value Date   IGA 159 03/09/2012   No results found for: Odetta Pink, SPEI   Chemistry      Component Value Date/Time   NA 144 06/06/2017 0840   K 3.9 06/06/2017 0840   CL 104 06/06/2017 0840   CO2 28 06/06/2017 0840   BUN 13 06/06/2017 0840   CREATININE 1.0 06/06/2017 0840      Component Value Date/Time   CALCIUM 9.3 06/06/2017 0840   ALKPHOS 145 (H) 06/06/2017 0840   AST 27 06/06/2017 0840   ALT 40 06/06/2017 0840   BILITOT 0.70 06/06/2017 0840      Impression and Plan: Mr. Haile is a very pleasant 76 yo caucasian gentleman with diffuse large B-cell lymphoma (not double hit lymphoma). He is doing well and feeling much better.  We will proceed with cycle 5 today as planned per Dr. Marin Olp and repeat a PET scan after cycle 8.  He has his  current treatment and appointment schedule. We will see him back again in another 3 weeks for follow-up, lab and treatment.  He promises to contact our office with any questions or concerns. We can certainly see him sooner if need be.   Laverna Peace, NP 12/7/201810:09 AM

## 2017-06-07 ENCOUNTER — Encounter: Payer: Self-pay | Admitting: Physician Assistant

## 2017-06-10 ENCOUNTER — Inpatient Hospital Stay: Payer: Medicare Other | Attending: Oncology

## 2017-06-10 ENCOUNTER — Other Ambulatory Visit: Payer: Self-pay | Admitting: Family

## 2017-06-10 ENCOUNTER — Other Ambulatory Visit: Payer: Self-pay | Admitting: Hematology & Oncology

## 2017-06-10 DIAGNOSIS — Z79899 Other long term (current) drug therapy: Secondary | ICD-10-CM | POA: Diagnosis not present

## 2017-06-10 DIAGNOSIS — C8330 Diffuse large B-cell lymphoma, unspecified site: Secondary | ICD-10-CM | POA: Diagnosis not present

## 2017-06-10 DIAGNOSIS — C8332 Diffuse large B-cell lymphoma, intrathoracic lymph nodes: Secondary | ICD-10-CM

## 2017-06-10 MED ORDER — PEGFILGRASTIM INJECTION 6 MG/0.6ML ~~LOC~~: 6.0000 mg | PREFILLED_SYRINGE | Freq: Once | SUBCUTANEOUS | Status: DC

## 2017-06-10 MED ORDER — PEGFILGRASTIM INJECTION 6 MG/0.6ML ~~LOC~~
6.0000 mg | PREFILLED_SYRINGE | Freq: Once | SUBCUTANEOUS | Status: AC
  Administered 2017-06-10: 6 mg via SUBCUTANEOUS
  Filled 2017-06-10: qty 0.6

## 2017-06-11 ENCOUNTER — Encounter: Payer: Self-pay | Admitting: Pharmacist

## 2017-06-20 ENCOUNTER — Other Ambulatory Visit: Payer: Medicare Other

## 2017-06-20 ENCOUNTER — Ambulatory Visit: Payer: Medicare Other

## 2017-06-20 ENCOUNTER — Ambulatory Visit: Payer: Medicare Other | Admitting: Hematology & Oncology

## 2017-06-26 ENCOUNTER — Other Ambulatory Visit: Payer: Self-pay | Admitting: Hematology & Oncology

## 2017-06-27 ENCOUNTER — Ambulatory Visit: Payer: Medicare Other

## 2017-06-27 ENCOUNTER — Ambulatory Visit (HOSPITAL_BASED_OUTPATIENT_CLINIC_OR_DEPARTMENT_OTHER): Payer: Medicare Other | Admitting: Family

## 2017-06-27 ENCOUNTER — Other Ambulatory Visit (HOSPITAL_BASED_OUTPATIENT_CLINIC_OR_DEPARTMENT_OTHER): Payer: Medicare Other

## 2017-06-27 ENCOUNTER — Other Ambulatory Visit: Payer: Self-pay

## 2017-06-27 ENCOUNTER — Ambulatory Visit (HOSPITAL_BASED_OUTPATIENT_CLINIC_OR_DEPARTMENT_OTHER): Payer: Medicare Other

## 2017-06-27 VITALS — BP 110/48 | HR 63 | Temp 98.2°F | Resp 18

## 2017-06-27 VITALS — BP 122/67 | HR 85 | Temp 98.0°F | Resp 20 | Wt 257.0 lb

## 2017-06-27 DIAGNOSIS — Z5112 Encounter for antineoplastic immunotherapy: Secondary | ICD-10-CM

## 2017-06-27 DIAGNOSIS — D510 Vitamin B12 deficiency anemia due to intrinsic factor deficiency: Secondary | ICD-10-CM | POA: Diagnosis not present

## 2017-06-27 DIAGNOSIS — Z5111 Encounter for antineoplastic chemotherapy: Secondary | ICD-10-CM | POA: Diagnosis not present

## 2017-06-27 DIAGNOSIS — C8330 Diffuse large B-cell lymphoma, unspecified site: Secondary | ICD-10-CM | POA: Diagnosis not present

## 2017-06-27 DIAGNOSIS — D508 Other iron deficiency anemias: Secondary | ICD-10-CM

## 2017-06-27 DIAGNOSIS — D519 Vitamin B12 deficiency anemia, unspecified: Secondary | ICD-10-CM

## 2017-06-27 DIAGNOSIS — C8332 Diffuse large B-cell lymphoma, intrathoracic lymph nodes: Secondary | ICD-10-CM

## 2017-06-27 LAB — CMP (CANCER CENTER ONLY)
ALBUMIN: 3.6 g/dL (ref 3.3–5.5)
ALT(SGPT): 24 U/L (ref 10–47)
AST: 20 U/L (ref 11–38)
Alkaline Phosphatase: 128 U/L — ABNORMAL HIGH (ref 26–84)
BILIRUBIN TOTAL: 0.8 mg/dL (ref 0.20–1.60)
BUN: 12 mg/dL (ref 7–22)
CHLORIDE: 103 meq/L (ref 98–108)
CO2: 26 meq/L (ref 18–33)
CREATININE: 1 mg/dL (ref 0.6–1.2)
Calcium: 9.2 mg/dL (ref 8.0–10.3)
GLUCOSE: 166 mg/dL — AB (ref 73–118)
Potassium: 3.9 mEq/L (ref 3.3–4.7)
SODIUM: 144 meq/L (ref 128–145)
Total Protein: 7 g/dL (ref 6.4–8.1)

## 2017-06-27 LAB — CBC WITH DIFFERENTIAL (CANCER CENTER ONLY)
BASO#: 0.1 10*3/uL (ref 0.0–0.2)
BASO%: 0.7 % (ref 0.0–2.0)
EOS%: 0.3 % (ref 0.0–7.0)
Eosinophils Absolute: 0 10*3/uL (ref 0.0–0.5)
HCT: 34 % — ABNORMAL LOW (ref 38.7–49.9)
HEMOGLOBIN: 10.8 g/dL — AB (ref 13.0–17.1)
LYMPH#: 0.5 10*3/uL — ABNORMAL LOW (ref 0.9–3.3)
LYMPH%: 7.3 % — AB (ref 14.0–48.0)
MCH: 28.5 pg (ref 28.0–33.4)
MCHC: 31.8 g/dL — AB (ref 32.0–35.9)
MCV: 90 fL (ref 82–98)
MONO#: 0.8 10*3/uL (ref 0.1–0.9)
MONO%: 10.5 % (ref 0.0–13.0)
NEUT%: 81.2 % — ABNORMAL HIGH (ref 40.0–80.0)
NEUTROS ABS: 5.9 10*3/uL (ref 1.5–6.5)
PLATELETS: 190 10*3/uL (ref 145–400)
RBC: 3.79 10*6/uL — ABNORMAL LOW (ref 4.20–5.70)
RDW: 17.9 % — ABNORMAL HIGH (ref 11.1–15.7)
WBC: 7.2 10*3/uL (ref 4.0–10.0)

## 2017-06-27 LAB — LACTATE DEHYDROGENASE: LDH: 218 U/L (ref 125–245)

## 2017-06-27 MED ORDER — ACETAMINOPHEN 325 MG PO TABS
650.0000 mg | ORAL_TABLET | Freq: Once | ORAL | Status: AC
  Administered 2017-06-27: 650 mg via ORAL

## 2017-06-27 MED ORDER — DOXORUBICIN HCL CHEMO IV INJECTION 2 MG/ML
50.0000 mg/m2 | Freq: Once | INTRAVENOUS | Status: AC
  Administered 2017-06-27: 122 mg via INTRAVENOUS
  Filled 2017-06-27: qty 61

## 2017-06-27 MED ORDER — HEPARIN SOD (PORK) LOCK FLUSH 100 UNIT/ML IV SOLN
500.0000 [IU] | Freq: Once | INTRAVENOUS | Status: AC | PRN
  Administered 2017-06-27: 500 [IU]
  Filled 2017-06-27: qty 5

## 2017-06-27 MED ORDER — SODIUM CHLORIDE 0.9 % IV SOLN
375.0000 mg/m2 | Freq: Once | INTRAVENOUS | Status: AC
  Administered 2017-06-27: 900 mg via INTRAVENOUS
  Filled 2017-06-27: qty 50

## 2017-06-27 MED ORDER — SODIUM CHLORIDE 0.9% FLUSH
10.0000 mL | INTRAVENOUS | Status: DC | PRN
  Administered 2017-06-27: 10 mL
  Filled 2017-06-27: qty 10

## 2017-06-27 MED ORDER — SODIUM CHLORIDE 0.9 % IV SOLN
750.0000 mg/m2 | Freq: Once | INTRAVENOUS | Status: AC
  Administered 2017-06-27: 1820 mg via INTRAVENOUS
  Filled 2017-06-27: qty 91

## 2017-06-27 MED ORDER — PEGFILGRASTIM 6 MG/0.6ML ~~LOC~~ PSKT
PREFILLED_SYRINGE | SUBCUTANEOUS | Status: AC
  Filled 2017-06-27: qty 0.6

## 2017-06-27 MED ORDER — PALONOSETRON HCL INJECTION 0.25 MG/5ML
0.2500 mg | Freq: Once | INTRAVENOUS | Status: AC
  Administered 2017-06-27: 0.25 mg via INTRAVENOUS

## 2017-06-27 MED ORDER — SODIUM CHLORIDE 0.9 % IV SOLN
Freq: Once | INTRAVENOUS | Status: AC
Start: 2017-06-27 — End: 2017-06-27
  Administered 2017-06-27: 10:00:00 via INTRAVENOUS

## 2017-06-27 MED ORDER — VINCRISTINE SULFATE CHEMO INJECTION 1 MG/ML
2.0000 mg | Freq: Once | INTRAVENOUS | Status: AC
  Administered 2017-06-27: 2 mg via INTRAVENOUS
  Filled 2017-06-27: qty 2

## 2017-06-27 MED ORDER — DIPHENHYDRAMINE HCL 25 MG PO CAPS
ORAL_CAPSULE | ORAL | Status: AC
Start: 2017-06-27 — End: 2017-06-27
  Filled 2017-06-27: qty 2

## 2017-06-27 MED ORDER — DEXAMETHASONE SODIUM PHOSPHATE 10 MG/ML IJ SOLN
INTRAMUSCULAR | Status: AC
  Filled 2017-06-27: qty 1

## 2017-06-27 MED ORDER — CYANOCOBALAMIN 1000 MCG/ML IJ SOLN
1000.0000 ug | Freq: Once | INTRAMUSCULAR | Status: AC
  Administered 2017-06-27: 1000 ug via INTRAMUSCULAR

## 2017-06-27 MED ORDER — PEGFILGRASTIM 6 MG/0.6ML ~~LOC~~ PSKT
6.0000 mg | PREFILLED_SYRINGE | Freq: Once | SUBCUTANEOUS | Status: AC
  Administered 2017-06-27: 6 mg via SUBCUTANEOUS

## 2017-06-27 MED ORDER — PALONOSETRON HCL INJECTION 0.25 MG/5ML
INTRAVENOUS | Status: AC
  Filled 2017-06-27: qty 5

## 2017-06-27 MED ORDER — CYANOCOBALAMIN 1000 MCG/ML IJ SOLN
INTRAMUSCULAR | Status: AC
  Filled 2017-06-27: qty 1

## 2017-06-27 MED ORDER — DIPHENHYDRAMINE HCL 25 MG PO CAPS
50.0000 mg | ORAL_CAPSULE | Freq: Once | ORAL | Status: AC
  Administered 2017-06-27: 50 mg via ORAL

## 2017-06-27 MED ORDER — DEXAMETHASONE SODIUM PHOSPHATE 10 MG/ML IJ SOLN
10.0000 mg | Freq: Once | INTRAMUSCULAR | Status: AC
  Administered 2017-06-27: 10 mg via INTRAVENOUS

## 2017-06-27 MED ORDER — ACETAMINOPHEN 325 MG PO TABS
ORAL_TABLET | ORAL | Status: AC
  Filled 2017-06-27: qty 2

## 2017-06-27 NOTE — Patient Instructions (Signed)
[facility] Discharge Instructions for Patients Receiving Chemotherapy  Today you received the following chemotherapy agents:  Adriamycin, Vincristine, Cytoxan and Rituxan  To help prevent nausea and vomiting after your treatment, we encourage you to take your nausea medication as ordered per MD.    If you develop nausea and vomiting that is not controlled by your nausea medication, call the clinic.   BELOW ARE SYMPTOMS THAT SHOULD BE REPORTED IMMEDIATELY:  *FEVER GREATER THAN 100.5 F  *CHILLS WITH OR WITHOUT FEVER  NAUSEA AND VOMITING THAT IS NOT CONTROLLED WITH YOUR NAUSEA MEDICATION  *UNUSUAL SHORTNESS OF BREATH  *UNUSUAL BRUISING OR BLEEDING  TENDERNESS IN MOUTH AND THROAT WITH OR WITHOUT PRESENCE OF ULCERS  *URINARY PROBLEMS  *BOWEL PROBLEMS  UNUSUAL RASH Items with * indicate a potential emergency and should be followed up as soon as possible.  Feel free to call the clinic should you have any questions or concerns. The clinic phone number is [phone].  Please show the [facility] at check-in to the Emergency Department and triage nurse.

## 2017-06-27 NOTE — Progress Notes (Signed)
Hematology and Oncology Follow Up Visit  Richard [name] [id number] 1940/08/31 76 y.o. 06/27/2017   Principle Diagnosis:  Diffuse large cell non-Hodgkin's lymphoma (IPI = 3) - NOT "double hit" Pernicious anemia  Current Therapy:   R-CHOP - s/p cycle5 Vitamin B12 1 mg IM every month Xgeva 120 mg subcu q 3 months   Interim History:  Mr. Geller is here today for follow-up and treatment. He is doing quite well and has no complaints at this time. He has not been able to golf because of the cold weather and rain. He has been watching football and enjoying his family.  He continues to tolerate treatment nicely. He denies fatigue Left axillary seroma noted and is a bit smaller than on prior exam. No lymphadenopathy found on exam.  No fever, chills, n/v, cough, dizziness, SOB, chest pain, palpitations, abdominal pain or changes in bowel or bladder habits.  Taking one imodium each morning has stopped the diarrhea and his BM's are now back to being regular, once daily.  The swelling in his lower extremities is improved on lasix and also gets better when he elevates his feet. Neuropathy in his feet is unchanged.  He has maintained a good appetite and is staying well hydrated. His weight is stable.   ECOG Performance Status: 1 - Symptomatic but completely ambulatory  Medications:  Allergies as of 06/27/2017      Reactions   Benazepril Swelling   angioedema; he is not a candidate for any angiotensin receptor blockers because of this significant allergic reaction. Because of a history of documented adverse serious drug reaction;Medi Alert bracelet  is recommended   Hctz [hydrochlorothiazide] Anaphylaxis, Swelling   Tongue and lip swelling   Aspirin Other (See Comments)   Gastritis, cant take 325 Mg aspirin    Lactose Intolerance (gi) Nausea And Vomiting      Medication List        Accurate as of 06/27/17  9:27 AM. Always use your most recent med list.          allopurinol 300 MG  tablet Commonly known as:  ZYLOPRIM Take 450 mg by mouth daily.   amLODipine 5 MG tablet Commonly known as:  NORVASC TAKE 2 TABLETS (10 MG TOTAL) BY MOUTH DAILY.   aspirin 81 MG tablet Take 81 mg by mouth daily.   atorvastatin 80 MG tablet Commonly known as:  LIPITOR TAKE ONE TABLET BY MOUTH AT BEDTIME   diphenoxylate-atropine 2.5-0.025 MG tablet Commonly known as:  LOMOTIL Take 2 pills at the onset of diarrhea, then take one pill after every loose stool. Maximum 8 pills per 24h.   esomeprazole 40 MG capsule Commonly known as:  NEXIUM Take 1 capsule (40 mg total) by mouth daily.   ezetimibe 10 MG tablet Commonly known as:  ZETIA TAKE ONE TABLET BY MOUTH DAILY   famciclovir 500 MG tablet Commonly known as:  FAMVIR Take 1 tablet (500 mg total) by mouth daily.   fluticasone 50 MCG/ACT nasal spray Commonly known as:  FLONASE INHALE ONE SPRAY INTO EACH NOSTRIL TWICE DAILY.   freestyle lancets Use BID to check blood sugar.  DX E11.9   furosemide 40 MG tablet Commonly known as:  LASIX Take 3 tablets (120 mg total) 2 (two) times daily by mouth.   glimepiride 2 MG tablet Commonly known as:  AMARYL TAKE 1 TABLET (2 MG TOTAL) BY MOUTH DAILY WITH BREAKFAST.   glucose 5 g chewable tablet Chew 15 g by mouth as needed for low  blood sugar.   glucose blood test strip Commonly known as:  FREESTYLE LITE TEST BLOOD SUGAR bid   ICY HOT EX Apply 1 application topically daily as needed (muscle pain).   lidocaine-prilocaine cream Commonly known as:  EMLA Apply to affected area once   lipase/protease/amylase 36000 UNITS Cpep capsule Commonly known as:  CREON Take 2 capsules (72,000 Units total) by mouth 3 (three) times daily with meals. 1 capsule with snack.   LORazepam 0.5 MG tablet Commonly known as:  ATIVAN Take 1 tablet (0.5 mg total) by mouth every 6 (six) hours as needed (Nausea or vomiting).   metFORMIN 1000 MG tablet Commonly known as:  GLUCOPHAGE TAKE ONE AND  ONE-HALF TABLETS BY MOUTH EVERY MORNING AND 1 TALBET IN THE EVENING.   metoprolol tartrate 25 MG tablet Commonly known as:  LOPRESSOR TAKE ONE-HALF TABLET BY MOUTH TWICE DAILY   nitroGLYCERIN 0.4 MG SL tablet Commonly known as:  NITROSTAT Place 1 tablet (0.4 mg total) under the tongue every 5 (five) minutes as needed for chest pain.   ondansetron 8 MG tablet Commonly known as:  ZOFRAN Take 1 tablet (8 mg total) by mouth 2 (two) times daily as needed for refractory nausea / vomiting. Start on day 3 after cyclophosphamide chemotherapy.   polycarbophil 625 MG tablet Commonly known as:  FIBERCON Take 625 mg by mouth daily.   potassium chloride SA 20 MEQ tablet Commonly known as:  KLOR-CON M20 Take 2 tablets (40 mEq total) 2 (two) times daily by mouth.   predniSONE 20 MG tablet Commonly known as:  DELTASONE Take 3 pills a day, with food, for 5 days. Start the day after each chemotherapy   pregabalin 75 MG capsule Commonly known as:  LYRICA Take 1 capsule (75 mg total) by mouth 3 (three) times daily.   prochlorperazine 10 MG tablet Commonly known as:  COMPAZINE Take 1 tablet (10 mg total) by mouth every 6 (six) hours as needed (Nausea or vomiting).       Allergies:  Allergies  Allergen Reactions  . Benazepril Swelling    angioedema; he is not a candidate for any angiotensin receptor blockers because of this significant allergic reaction. Because of a history of documented adverse serious drug reaction;Medi Alert bracelet  is recommended  . Hctz [Hydrochlorothiazide] Anaphylaxis and Swelling    Tongue and lip swelling   . Aspirin Other (See Comments)    Gastritis, cant take 325 Mg aspirin   . Lactose Intolerance (Gi) Nausea And Vomiting    Past Medical History, Surgical history, Social history, and Family History were reviewed and updated.  Review of Systems: All other 10 point review of systems is negative.   Physical Exam:  weight is 257 lb (116.6 kg). His oral  temperature is 98 F (36.7 C). His blood pressure is 122/67 and his pulse is 85. His respiration is 20 and oxygen saturation is 96%.   Wt Readings from Last 3 Encounters:  06/27/17 257 lb (116.6 kg)  06/06/17 252 lb 6 oz (114.5 kg)  06/05/17 255 lb (115.7 kg)    Ocular: Sclerae unicteric, pupils equal, round and reactive to light Ear-nose-throat: Oropharynx clear, dentition fair Lymphatic: No cervical, supraclavicular or axillary adenopathy Lungs no rales or rhonchi, good excursion bilaterally Heart regular rate and rhythm, no murmur appreciated Abd soft, nontender, positive bowel sounds, no liver or spleen tip palpated on exam, no fluid wave  MSK no focal spinal tenderness, no joint edema Neuro: non-focal, well-oriented, appropriate affect Breasts: Deferred  Lab Results  Component Value Date   WBC 7.2 06/27/2017   HGB 10.8 (L) 06/27/2017   HCT 34.0 (L) 06/27/2017   MCV 90 06/27/2017   PLT 190 06/27/2017   Lab Results  Component Value Date   FERRITIN 557 (H) 02/12/2017   IRON 54 02/12/2017   TIBC 306 02/12/2017   UIBC 252 02/12/2017   IRONPCTSAT 18 (L) 02/12/2017   Lab Results  Component Value Date   RETICCTPCT 1.1 11/22/2011   RBC 3.79 (L) 06/27/2017   RETICCTABS 52.1 11/22/2011   No results found for: KPAFRELGTCHN, LAMBDASER, KAPLAMBRATIO Lab Results  Component Value Date   IGA 159 03/09/2012   No results found for: Odetta Pink, SPEI   Chemistry      Component Value Date/Time   NA 144 06/06/2017 0840   K 3.9 06/06/2017 0840   CL 104 06/06/2017 0840   CO2 28 06/06/2017 0840   BUN 13 06/06/2017 0840   CREATININE 1.0 06/06/2017 0840      Component Value Date/Time   CALCIUM 9.3 06/06/2017 0840   ALKPHOS 145 (H) 06/06/2017 0840   AST 27 06/06/2017 0840   ALT 40 06/06/2017 0840   BILITOT 0.70 06/06/2017 0840      Impression and Plan: Mr. Meir is a very pleasant 76 yo caucasian gentleman with  diffuse large B-cell lymphoma (not double hit lymphoma). He continues to do well and has no complaints at this time.  We will proceed with cycle 6 today as planned.  We will repeat a PET scan after cycle 8.  He has his current treatment and appointment schedule.  He will contact our office with any questions or concerns. We can certainly see him sooner if need be.   Laverna Peace, NP 12/28/20189:27 AM

## 2017-07-11 ENCOUNTER — Other Ambulatory Visit: Payer: Medicare Other

## 2017-07-11 ENCOUNTER — Ambulatory Visit: Payer: Medicare Other

## 2017-07-11 ENCOUNTER — Ambulatory Visit: Payer: Medicare Other | Admitting: Hematology & Oncology

## 2017-07-18 ENCOUNTER — Inpatient Hospital Stay: Payer: Medicare Other

## 2017-07-18 ENCOUNTER — Telehealth: Payer: Self-pay | Admitting: Cardiology

## 2017-07-18 ENCOUNTER — Inpatient Hospital Stay: Payer: Medicare Other | Attending: Hematology & Oncology | Admitting: Hematology & Oncology

## 2017-07-18 VITALS — BP 104/54 | HR 77 | Resp 19

## 2017-07-18 DIAGNOSIS — Z5112 Encounter for antineoplastic immunotherapy: Secondary | ICD-10-CM | POA: Insufficient documentation

## 2017-07-18 DIAGNOSIS — Z5111 Encounter for antineoplastic chemotherapy: Secondary | ICD-10-CM | POA: Insufficient documentation

## 2017-07-18 DIAGNOSIS — C8330 Diffuse large B-cell lymphoma, unspecified site: Secondary | ICD-10-CM | POA: Diagnosis not present

## 2017-07-18 DIAGNOSIS — D510 Vitamin B12 deficiency anemia due to intrinsic factor deficiency: Secondary | ICD-10-CM | POA: Diagnosis not present

## 2017-07-18 DIAGNOSIS — Z5189 Encounter for other specified aftercare: Secondary | ICD-10-CM | POA: Diagnosis not present

## 2017-07-18 DIAGNOSIS — J019 Acute sinusitis, unspecified: Secondary | ICD-10-CM | POA: Diagnosis not present

## 2017-07-18 DIAGNOSIS — C8332 Diffuse large B-cell lymphoma, intrathoracic lymph nodes: Secondary | ICD-10-CM

## 2017-07-18 LAB — CBC WITH DIFFERENTIAL (CANCER CENTER ONLY)
BASOS ABS: 0.1 10*3/uL (ref 0.0–0.1)
Basophils Relative: 3 %
EOS PCT: 0 %
Eosinophils Absolute: 0 10*3/uL (ref 0.0–0.5)
HEMATOCRIT: 34.1 % — AB (ref 38.7–49.9)
Hemoglobin: 10.8 g/dL — ABNORMAL LOW (ref 13.0–17.1)
LYMPHS ABS: 0.5 10*3/uL — AB (ref 0.9–3.3)
LYMPHS PCT: 10 %
MCH: 29 pg (ref 28.0–33.4)
MCHC: 31.7 g/dL — ABNORMAL LOW (ref 32.0–35.9)
MCV: 91.7 fL (ref 82.0–98.0)
MONO ABS: 0.6 10*3/uL (ref 0.1–0.9)
Monocytes Relative: 14 %
NEUTROS ABS: 3.3 10*3/uL (ref 1.5–6.5)
Neutrophils Relative %: 73 %
PLATELETS: 211 10*3/uL (ref 140–400)
RBC: 3.72 MIL/uL — ABNORMAL LOW (ref 4.20–5.70)
RDW: 18 % — AB (ref 11.1–15.7)
WBC Count: 4.5 10*3/uL (ref 4.0–10.3)

## 2017-07-18 LAB — CMP (CANCER CENTER ONLY)
ALT: 23 U/L (ref 0–55)
ANION GAP: 17 — AB (ref 5–15)
AST: 24 U/L (ref 5–34)
Albumin: 4 g/dL (ref 3.5–5.0)
Alkaline Phosphatase: 121 U/L — ABNORMAL HIGH (ref 26–84)
BILIRUBIN TOTAL: 0.8 mg/dL (ref 0.2–1.2)
BUN: 18 mg/dL (ref 7–22)
CO2: 28 mmol/L (ref 18–33)
Calcium: 9.9 mg/dL (ref 8.0–10.3)
Chloride: 100 mmol/L (ref 98–108)
Creatinine: 1 mg/dL (ref 0.70–1.30)
GLUCOSE: 205 mg/dL — AB (ref 70–118)
POTASSIUM: 3.6 mmol/L (ref 3.3–4.7)
Sodium: 145 mmol/L (ref 128–145)
TOTAL PROTEIN: 6.8 g/dL (ref 6.4–8.1)

## 2017-07-18 LAB — LACTATE DEHYDROGENASE: LDH: 250 U/L — ABNORMAL HIGH (ref 125–245)

## 2017-07-18 MED ORDER — SODIUM CHLORIDE 0.9 % IV SOLN
375.0000 mg/m2 | Freq: Once | INTRAVENOUS | Status: AC
  Administered 2017-07-18: 900 mg via INTRAVENOUS
  Filled 2017-07-18: qty 50

## 2017-07-18 MED ORDER — SODIUM CHLORIDE 0.9% FLUSH
10.0000 mL | INTRAVENOUS | Status: DC | PRN
  Administered 2017-07-18: 10 mL
  Filled 2017-07-18: qty 10

## 2017-07-18 MED ORDER — DEXAMETHASONE SODIUM PHOSPHATE 10 MG/ML IJ SOLN
10.0000 mg | Freq: Once | INTRAMUSCULAR | Status: AC
  Administered 2017-07-18: 10 mg via INTRAVENOUS

## 2017-07-18 MED ORDER — PALONOSETRON HCL INJECTION 0.25 MG/5ML
0.2500 mg | Freq: Once | INTRAVENOUS | Status: AC
  Administered 2017-07-18: 0.25 mg via INTRAVENOUS

## 2017-07-18 MED ORDER — SODIUM CHLORIDE 0.9 % IV SOLN
Freq: Once | INTRAVENOUS | Status: AC
  Administered 2017-07-18: 10:00:00 via INTRAVENOUS

## 2017-07-18 MED ORDER — DIPHENHYDRAMINE HCL 25 MG PO CAPS
50.0000 mg | ORAL_CAPSULE | Freq: Once | ORAL | Status: AC
  Administered 2017-07-18: 50 mg via ORAL

## 2017-07-18 MED ORDER — PEGFILGRASTIM 6 MG/0.6ML ~~LOC~~ PSKT
PREFILLED_SYRINGE | SUBCUTANEOUS | Status: AC
  Filled 2017-07-18: qty 0.6

## 2017-07-18 MED ORDER — PALONOSETRON HCL INJECTION 0.25 MG/5ML
INTRAVENOUS | Status: AC
  Filled 2017-07-18: qty 5

## 2017-07-18 MED ORDER — DIPHENHYDRAMINE HCL 25 MG PO CAPS
ORAL_CAPSULE | ORAL | Status: AC
  Filled 2017-07-18: qty 2

## 2017-07-18 MED ORDER — PEGFILGRASTIM 6 MG/0.6ML ~~LOC~~ PSKT
6.0000 mg | PREFILLED_SYRINGE | Freq: Once | SUBCUTANEOUS | Status: AC
  Administered 2017-07-18: 6 mg via SUBCUTANEOUS

## 2017-07-18 MED ORDER — VINCRISTINE SULFATE CHEMO INJECTION 1 MG/ML
2.0000 mg | Freq: Once | INTRAVENOUS | Status: AC
  Administered 2017-07-18: 2 mg via INTRAVENOUS
  Filled 2017-07-18: qty 2

## 2017-07-18 MED ORDER — ACETAMINOPHEN 325 MG PO TABS
ORAL_TABLET | ORAL | Status: AC
  Filled 2017-07-18: qty 2

## 2017-07-18 MED ORDER — DOXORUBICIN HCL CHEMO IV INJECTION 2 MG/ML
50.0000 mg/m2 | Freq: Once | INTRAVENOUS | Status: AC
  Administered 2017-07-18: 122 mg via INTRAVENOUS
  Filled 2017-07-18: qty 61

## 2017-07-18 MED ORDER — DEXAMETHASONE SODIUM PHOSPHATE 10 MG/ML IJ SOLN
INTRAMUSCULAR | Status: AC
  Filled 2017-07-18: qty 1

## 2017-07-18 MED ORDER — AMOXICILLIN-POT CLAVULANATE 875-125 MG PO TABS: 1.0000 | ORAL_TABLET | Freq: Two times a day (BID) | ORAL | 0 refills | Status: DC

## 2017-07-18 MED ORDER — SODIUM CHLORIDE 0.9 % IV SOLN
750.0000 mg/m2 | Freq: Once | INTRAVENOUS | Status: AC
  Administered 2017-07-18: 1820 mg via INTRAVENOUS
  Filled 2017-07-18: qty 91

## 2017-07-18 MED ORDER — ACETAMINOPHEN 325 MG PO TABS
650.0000 mg | ORAL_TABLET | Freq: Once | ORAL | Status: AC
  Administered 2017-07-18: 650 mg via ORAL

## 2017-07-18 MED ORDER — HEPARIN SOD (PORK) LOCK FLUSH 100 UNIT/ML IV SOLN
500.0000 [IU] | Freq: Once | INTRAVENOUS | Status: AC | PRN
  Administered 2017-07-18: 500 [IU]
  Filled 2017-07-18: qty 5

## 2017-07-18 NOTE — Patient Instructions (Signed)
[facility] Discharge Instructions for Patients Receiving Chemotherapy  Today you received the following chemotherapy agents:  Adriamycin, Vincristine, Cytoxan and Rituxan  To help prevent nausea and vomiting after your treatment, we encourage you to take your nausea medication as ordered per MD.    If you develop nausea and vomiting that is not controlled by your nausea medication, call the clinic.   BELOW ARE SYMPTOMS THAT SHOULD BE REPORTED IMMEDIATELY:  *FEVER GREATER THAN 100.5 F  *CHILLS WITH OR WITHOUT FEVER  NAUSEA AND VOMITING THAT IS NOT CONTROLLED WITH YOUR NAUSEA MEDICATION  *UNUSUAL SHORTNESS OF BREATH  *UNUSUAL BRUISING OR BLEEDING  TENDERNESS IN MOUTH AND THROAT WITH OR WITHOUT PRESENCE OF ULCERS  *URINARY PROBLEMS  *BOWEL PROBLEMS  UNUSUAL RASH Items with * indicate a potential emergency and should be followed up as soon as possible.  Feel free to call the clinic should you have any questions or concerns. The clinic phone number is [phone].  Please show the [facility] at check-in to the Emergency Department and triage nurse.

## 2017-07-18 NOTE — Telephone Encounter (Signed)
Spoke with pt, he reports someone called to set him up for carotid dopplers and he had them done before christmas. Advised patient to disregard the call, nothing is needed at this time.

## 2017-07-18 NOTE — Progress Notes (Signed)
Hematology and Oncology Follow Up Visit  Richard [name] [id number] 06-29-1941 77 y.o. 07/18/2017   Principle Diagnosis:  Diffuse large cell non-Hodgkin's lymphoma (IPI = 3) - NOT "double hit" Pernicious anemia  Current Therapy:   R-CHOP - s/p cycle#6 Vitamin B12 1 mg IM every month Xgeva 120 mg subcu q 3 months - next dose in 08/2017   Interim History:  Richard [name] is here today for follow-up and treatment.  He played golf earlier this week.  He played 18 holes.  He has had no cardiac issues.  He does have aortic stenosis.  As such, this might need to be addressed after he finishes his treatments.  He is not having pain.  He is having some issues with abdominal "gas.".  I told him to try some Pepto-Bismol for this.  There is been no problems with nausea or vomiting.  He has had no cough.  He has had no rashes.  Is had no leg swelling.  He is complaining of some sinus drainage.  He says this is yellowish in color.  As such, I think we have to get him on some antibiotic.  I will call in some Augmentin for him.  Overall, I said his performance status is ECOG 1.    Medications:  Allergies as of 07/18/2017      Reactions   Benazepril Swelling   angioedema; he is not a candidate for any angiotensin receptor blockers because of this significant allergic reaction. Because of a history of documented adverse serious drug reaction;Medi Alert bracelet  is recommended   Hctz [hydrochlorothiazide] Anaphylaxis, Swelling   Tongue and lip swelling   Aspirin Other (See Comments)   Gastritis, cant take 325 Mg aspirin    Lactose Intolerance (gi) Nausea And Vomiting      Medication List        Accurate as of 07/18/17  9:53 AM. Always use your most recent med list.          allopurinol 300 MG tablet Commonly known as:  ZYLOPRIM Take 450 mg by mouth daily.   amLODipine 5 MG tablet Commonly known as:  NORVASC TAKE 2 TABLETS (10 MG TOTAL) BY MOUTH DAILY.   aspirin 81 MG tablet Take  81 mg by mouth daily.   atorvastatin 80 MG tablet Commonly known as:  LIPITOR TAKE ONE TABLET BY MOUTH AT BEDTIME   diphenoxylate-atropine 2.5-0.025 MG tablet Commonly known as:  LOMOTIL Take 2 pills at the onset of diarrhea, then take one pill after every loose stool. Maximum 8 pills per 24h.   esomeprazole 40 MG capsule Commonly known as:  NEXIUM Take 1 capsule (40 mg total) by mouth daily.   ezetimibe 10 MG tablet Commonly known as:  ZETIA TAKE ONE TABLET BY MOUTH DAILY   famciclovir 500 MG tablet Commonly known as:  FAMVIR Take 1 tablet (500 mg total) by mouth daily.   fluticasone 50 MCG/ACT nasal spray Commonly known as:  FLONASE INHALE ONE SPRAY INTO EACH NOSTRIL TWICE DAILY.   freestyle lancets Use BID to check blood sugar.  DX E11.9   furosemide 40 MG tablet Commonly known as:  LASIX Take 3 tablets (120 mg total) 2 (two) times daily by mouth.   glimepiride 2 MG tablet Commonly known as:  AMARYL TAKE 1 TABLET (2 MG TOTAL) BY MOUTH DAILY WITH BREAKFAST.   glucose 5 g chewable tablet Chew 15 g by mouth as needed for low blood sugar.   glucose blood test strip Commonly known  as:  FREESTYLE LITE TEST BLOOD SUGAR bid   ICY HOT EX Apply 1 application topically daily as needed (muscle pain).   lidocaine-prilocaine cream Commonly known as:  EMLA Apply to affected area once   lipase/protease/amylase 36000 UNITS Cpep capsule Commonly known as:  CREON Take 2 capsules (72,000 Units total) by mouth 3 (three) times daily with meals. 1 capsule with snack.   LORazepam 0.5 MG tablet Commonly known as:  ATIVAN Take 1 tablet (0.5 mg total) by mouth every 6 (six) hours as needed (Nausea or vomiting).   metFORMIN 1000 MG tablet Commonly known as:  GLUCOPHAGE TAKE ONE AND ONE-HALF TABLETS BY MOUTH EVERY MORNING AND 1 TALBET IN THE EVENING.   metoprolol tartrate 25 MG tablet Commonly known as:  LOPRESSOR TAKE ONE-HALF TABLET BY MOUTH TWICE DAILY   nitroGLYCERIN 0.4  MG SL tablet Commonly known as:  NITROSTAT Place 1 tablet (0.4 mg total) under the tongue every 5 (five) minutes as needed for chest pain.   ondansetron 8 MG tablet Commonly known as:  ZOFRAN Take 1 tablet (8 mg total) by mouth 2 (two) times daily as needed for refractory nausea / vomiting. Start on day 3 after cyclophosphamide chemotherapy.   polycarbophil 625 MG tablet Commonly known as:  FIBERCON Take 625 mg by mouth daily.   potassium chloride SA 20 MEQ tablet Commonly known as:  KLOR-CON M20 Take 2 tablets (40 mEq total) 2 (two) times daily by mouth.   predniSONE 20 MG tablet Commonly known as:  DELTASONE Take 3 pills a day, with food, for 5 days. Start the day after each chemotherapy   pregabalin 75 MG capsule Commonly known as:  LYRICA Take 1 capsule (75 mg total) by mouth 3 (three) times daily.   prochlorperazine 10 MG tablet Commonly known as:  COMPAZINE Take 1 tablet (10 mg total) by mouth every 6 (six) hours as needed (Nausea or vomiting).       Allergies:  Allergies  Allergen Reactions  . Benazepril Swelling    angioedema; he is not a candidate for any angiotensin receptor blockers because of this significant allergic reaction. Because of a history of documented adverse serious drug reaction;Medi Alert bracelet  is recommended  . Hctz [Hydrochlorothiazide] Anaphylaxis and Swelling    Tongue and lip swelling   . Aspirin Other (See Comments)    Gastritis, cant take 325 Mg aspirin   . Lactose Intolerance (Gi) Nausea And Vomiting    Past Medical History, Surgical history, Social history, and Family History were reviewed and updated.  Review of Systems: Review of Systems  Constitutional: Negative.   HENT: Positive for congestion.   Eyes: Negative.   Respiratory: Negative.   Cardiovascular: Negative.   Gastrointestinal: Negative.   Genitourinary: Negative.   Musculoskeletal: Negative.   Skin: Negative.   Neurological: Negative.   Endo/Heme/Allergies:  Negative.      Physical Exam:  vitals were not taken for this visit.   Wt Readings from Last 3 Encounters:  07/18/17 251 lb 12 oz (114.2 kg)  06/27/17 257 lb (116.6 kg)  06/06/17 252 lb 6 oz (114.5 kg)    Physical Exam  Constitutional: He is oriented to person, place, and time.  HENT:  Head: Normocephalic and atraumatic.  Mouth/Throat: Oropharynx is clear and moist.  Eyes: EOM are normal. Pupils are equal, round, and reactive to light.  Neck: Normal range of motion.  Cardiovascular: Normal rate, regular rhythm and normal heart sounds.  Pulmonary/Chest: Effort normal and breath sounds normal.  Abdominal: Soft. Bowel sounds are normal.  Musculoskeletal: Normal range of motion. He exhibits no edema, tenderness or deformity.  Lymphadenopathy:    He has no cervical adenopathy.  Neurological: He is alert and oriented to person, place, and time.  Skin: Skin is warm and dry. No rash noted. No erythema.  Psychiatric: He has a normal mood and affect. His behavior is normal. Judgment and thought content normal.  Vitals reviewed.   Lab Results  Component Value Date   WBC 4.5 07/18/2017   HGB 10.8 (L) 06/27/2017   HCT 34.1 (L) 07/18/2017   MCV 91.7 07/18/2017   PLT 211 07/18/2017   Lab Results  Component Value Date   FERRITIN 557 (H) 02/12/2017   IRON 54 02/12/2017   TIBC 306 02/12/2017   UIBC 252 02/12/2017   IRONPCTSAT 18 (L) 02/12/2017   Lab Results  Component Value Date   RETICCTPCT 1.1 11/22/2011   RBC 3.72 (L) 07/18/2017   RETICCTABS 52.1 11/22/2011   No results found for: Nils Pyle [facility] Lab Results  Component Value Date   IGA 159 03/09/2012   No results found for: Odetta Pink, SPEI   Chemistry      Component Value Date/Time   NA 145 07/18/2017 0846   NA 144 06/27/2017 0857   K 3.6 07/18/2017 0846   K 3.9 06/27/2017 0857   CL 100 07/18/2017 0846   CL 103 06/27/2017 0857   CO2 28  07/18/2017 0846   CO2 26 06/27/2017 0857   BUN 18 07/18/2017 0846   BUN 12 06/27/2017 0857   CREATININE 1.0 06/27/2017 0857      Component Value Date/Time   CALCIUM 9.9 07/18/2017 0846   CALCIUM 9.2 06/27/2017 0857   ALKPHOS 121 (H) 07/18/2017 0846   ALKPHOS 128 (H) 06/27/2017 0857   AST 24 07/18/2017 0846   ALT 23 07/18/2017 0846   ALT 24 06/27/2017 0857   BILITOT 0.8 07/18/2017 0846      Impression and Plan: Richard [name] is a very pleasant 77 yo caucasian gentleman with diffuse large B-cell lymphoma (not double hit lymphoma).   So far, his response has been very nice.  His initial PET scan that we did after his second cycle of treatment did not show any active disease.  We will still proceed with 8 cycles.  This will be his seventh cycle.  He will get Xgeva with his next cycle.  Again, I will call in Augmentin for the sinus drainage.     [name], MD 1/18/20199:53 AM

## 2017-07-18 NOTE — Telephone Encounter (Signed)
New message ° °Pt verbalized that he is returning call for RN °

## 2017-07-23 ENCOUNTER — Encounter (HOSPITAL_COMMUNITY): Payer: Self-pay | Admitting: Emergency Medicine

## 2017-07-23 ENCOUNTER — Other Ambulatory Visit: Payer: Self-pay

## 2017-07-23 ENCOUNTER — Emergency Department (HOSPITAL_COMMUNITY): Payer: Medicare Other

## 2017-07-23 ENCOUNTER — Inpatient Hospital Stay (HOSPITAL_COMMUNITY)
Admission: EM | Admit: 2017-07-23 | Discharge: 2017-07-29 | DRG: 286 | Disposition: A | Discharge status: Home / Self Care | Payer: Medicare Other | Attending: Cardiovascular Disease | Admitting: Cardiovascular Disease

## 2017-07-23 DIAGNOSIS — D696 Thrombocytopenia, unspecified: Secondary | ICD-10-CM

## 2017-07-23 DIAGNOSIS — E782 Mixed hyperlipidemia: Secondary | ICD-10-CM | POA: Diagnosis present

## 2017-07-23 DIAGNOSIS — M48062 Spinal stenosis, lumbar region with neurogenic claudication: Secondary | ICD-10-CM | POA: Diagnosis present

## 2017-07-23 DIAGNOSIS — Z951 Presence of aortocoronary bypass graft: Secondary | ICD-10-CM

## 2017-07-23 DIAGNOSIS — M109 Gout, unspecified: Secondary | ICD-10-CM | POA: Diagnosis present

## 2017-07-23 DIAGNOSIS — I350 Nonrheumatic aortic (valve) stenosis: Secondary | ICD-10-CM

## 2017-07-23 DIAGNOSIS — T451X5A Adverse effect of antineoplastic and immunosuppressive drugs, initial encounter: Secondary | ICD-10-CM | POA: Diagnosis present

## 2017-07-23 DIAGNOSIS — C8330 Diffuse large B-cell lymphoma, unspecified site: Secondary | ICD-10-CM | POA: Diagnosis present

## 2017-07-23 DIAGNOSIS — I248 Other forms of acute ischemic heart disease: Secondary | ICD-10-CM | POA: Diagnosis not present

## 2017-07-23 DIAGNOSIS — Z886 Allergy status to analgesic agent status: Secondary | ICD-10-CM

## 2017-07-23 DIAGNOSIS — L732 Hidradenitis suppurativa: Secondary | ICD-10-CM | POA: Diagnosis present

## 2017-07-23 DIAGNOSIS — R079 Chest pain, unspecified: Secondary | ICD-10-CM | POA: Diagnosis not present

## 2017-07-23 DIAGNOSIS — K219 Gastro-esophageal reflux disease without esophagitis: Secondary | ICD-10-CM | POA: Diagnosis present

## 2017-07-23 DIAGNOSIS — E785 Hyperlipidemia, unspecified: Secondary | ICD-10-CM | POA: Diagnosis present

## 2017-07-23 DIAGNOSIS — E118 Type 2 diabetes mellitus with unspecified complications: Secondary | ICD-10-CM | POA: Diagnosis present

## 2017-07-23 DIAGNOSIS — R0789 Other chest pain: Secondary | ICD-10-CM | POA: Diagnosis not present

## 2017-07-23 DIAGNOSIS — C8338 Diffuse large B-cell lymphoma, lymph nodes of multiple sites: Secondary | ICD-10-CM | POA: Diagnosis present

## 2017-07-23 DIAGNOSIS — E1140 Type 2 diabetes mellitus with diabetic neuropathy, unspecified: Secondary | ICD-10-CM | POA: Diagnosis present

## 2017-07-23 DIAGNOSIS — D6959 Other secondary thrombocytopenia: Secondary | ICD-10-CM | POA: Diagnosis present

## 2017-07-23 DIAGNOSIS — C8332 Diffuse large B-cell lymphoma, intrathoracic lymph nodes: Secondary | ICD-10-CM | POA: Diagnosis present

## 2017-07-23 DIAGNOSIS — D649 Anemia, unspecified: Secondary | ICD-10-CM

## 2017-07-23 DIAGNOSIS — I110 Hypertensive heart disease with heart failure: Secondary | ICD-10-CM | POA: Diagnosis present

## 2017-07-23 DIAGNOSIS — Z833 Family history of diabetes mellitus: Secondary | ICD-10-CM

## 2017-07-23 DIAGNOSIS — Z8249 Family history of ischemic heart disease and other diseases of the circulatory system: Secondary | ICD-10-CM

## 2017-07-23 DIAGNOSIS — Z7984 Long term (current) use of oral hypoglycemic drugs: Secondary | ICD-10-CM

## 2017-07-23 DIAGNOSIS — J069 Acute upper respiratory infection, unspecified: Secondary | ICD-10-CM | POA: Diagnosis present

## 2017-07-23 DIAGNOSIS — Z6833 Body mass index (BMI) 33.0-33.9, adult: Secondary | ICD-10-CM

## 2017-07-23 DIAGNOSIS — I10 Essential (primary) hypertension: Secondary | ICD-10-CM | POA: Diagnosis present

## 2017-07-23 DIAGNOSIS — I4891 Unspecified atrial fibrillation: Secondary | ICD-10-CM | POA: Diagnosis not present

## 2017-07-23 DIAGNOSIS — D61818 Other pancytopenia: Secondary | ICD-10-CM

## 2017-07-23 DIAGNOSIS — E1165 Type 2 diabetes mellitus with hyperglycemia: Secondary | ICD-10-CM

## 2017-07-23 DIAGNOSIS — I441 Atrioventricular block, second degree: Secondary | ICD-10-CM | POA: Diagnosis present

## 2017-07-23 DIAGNOSIS — Z7982 Long term (current) use of aspirin: Secondary | ICD-10-CM

## 2017-07-23 DIAGNOSIS — Z7951 Long term (current) use of inhaled steroids: Secondary | ICD-10-CM

## 2017-07-23 DIAGNOSIS — Z888 Allergy status to other drugs, medicaments and biological substances status: Secondary | ICD-10-CM

## 2017-07-23 DIAGNOSIS — I5032 Chronic diastolic (congestive) heart failure: Secondary | ICD-10-CM | POA: Diagnosis not present

## 2017-07-23 DIAGNOSIS — E669 Obesity, unspecified: Secondary | ICD-10-CM | POA: Diagnosis present

## 2017-07-23 DIAGNOSIS — G8929 Other chronic pain: Secondary | ICD-10-CM | POA: Diagnosis present

## 2017-07-23 DIAGNOSIS — D61810 Antineoplastic chemotherapy induced pancytopenia: Secondary | ICD-10-CM | POA: Diagnosis present

## 2017-07-23 DIAGNOSIS — R001 Bradycardia, unspecified: Secondary | ICD-10-CM | POA: Diagnosis present

## 2017-07-23 DIAGNOSIS — Z955 Presence of coronary angioplasty implant and graft: Secondary | ICD-10-CM

## 2017-07-23 DIAGNOSIS — I25810 Atherosclerosis of coronary artery bypass graft(s) without angina pectoris: Secondary | ICD-10-CM | POA: Diagnosis not present

## 2017-07-23 DIAGNOSIS — Z79899 Other long term (current) drug therapy: Secondary | ICD-10-CM

## 2017-07-23 DIAGNOSIS — G4733 Obstructive sleep apnea (adult) (pediatric): Secondary | ICD-10-CM | POA: Diagnosis present

## 2017-07-23 DIAGNOSIS — Z85828 Personal history of other malignant neoplasm of skin: Secondary | ICD-10-CM

## 2017-07-23 DIAGNOSIS — I214 Non-ST elevation (NSTEMI) myocardial infarction: Secondary | ICD-10-CM

## 2017-07-23 DIAGNOSIS — E739 Lactose intolerance, unspecified: Secondary | ICD-10-CM | POA: Diagnosis present

## 2017-07-23 DIAGNOSIS — E1151 Type 2 diabetes mellitus with diabetic peripheral angiopathy without gangrene: Secondary | ICD-10-CM | POA: Diagnosis present

## 2017-07-23 DIAGNOSIS — M545 Low back pain: Secondary | ICD-10-CM | POA: Diagnosis present

## 2017-07-23 DIAGNOSIS — Z7952 Long term (current) use of systemic steroids: Secondary | ICD-10-CM

## 2017-07-23 HISTORY — DX: Diffuse large B-cell lymphoma, unspecified site: C83.30

## 2017-07-23 HISTORY — DX: Bradycardia, unspecified: R00.1

## 2017-07-23 HISTORY — DX: Nonrheumatic aortic (valve) stenosis: I35.0

## 2017-07-23 HISTORY — DX: Other pancytopenia: D61.818

## 2017-07-23 LAB — I-STAT TROPONIN, ED: Troponin i, poc: 0.09 ng/mL (ref 0.00–0.08)

## 2017-07-23 LAB — BASIC METABOLIC PANEL
Anion gap: 13 (ref 5–15)
BUN: 29 mg/dL — AB (ref 6–20)
CALCIUM: 8.6 mg/dL — AB (ref 8.9–10.3)
CO2: 21 mmol/L — ABNORMAL LOW (ref 22–32)
Chloride: 103 mmol/L (ref 101–111)
Creatinine, Ser: 1.03 mg/dL (ref 0.61–1.24)
GFR calc Af Amer: >60 mL/min (ref >60)
Glucose, Bld: 171 mg/dL — ABNORMAL HIGH (ref 65–99)
POTASSIUM: 4.4 mmol/L (ref 3.5–5.1)
Sodium: 137 mmol/L (ref 135–145)

## 2017-07-23 LAB — CBC
HEMATOCRIT: 31.5 % — AB (ref 39.0–52.0)
Hemoglobin: 9.8 g/dL — ABNORMAL LOW (ref 13.0–17.0)
MCH: 27.8 pg (ref 26.0–34.0)
MCHC: 31.1 g/dL (ref 30.0–36.0)
MCV: 89.5 fL (ref 78.0–100.0)
Platelets: 75 10*3/uL — ABNORMAL LOW (ref 150–400)
RBC: 3.52 MIL/uL — ABNORMAL LOW (ref 4.22–5.81)
RDW: 17.5 % — ABNORMAL HIGH (ref 11.5–15.5)
WBC: 2.6 10*3/uL — ABNORMAL LOW (ref 4.0–10.5)

## 2017-07-23 MED ORDER — ASPIRIN 81 MG PO CHEW
324.0000 mg | CHEWABLE_TABLET | Freq: Once | ORAL | Status: AC
  Administered 2017-07-24: 324 mg via ORAL
  Filled 2017-07-23: qty 4

## 2017-07-23 NOTE — ED Provider Notes (Signed)
[facility] EMERGENCY DEPARTMENT Provider Note   CSN: [id number] Arrival date & time: 07/23/17  2158     History   Chief Complaint Chief Complaint  Patient presents with  . Shortness of Breath  . Chest Pain    HPI Richard [name] is a 77 y.o. male.  HPI 77 year old male with history of coronary artery disease status post CABG, with last cath in 2015 showing 4 out of 5 patent grafts, currently being medically managed and severe aortic stenosis comes in with chief complaint of chest pain and shortness of breath.  Patient also has B-cell lymphoma for which she is undergoing chemotherapy, and he has history of gastritis.  Patient states that for the past 2 weeks he has noticed increasing dyspnea on exertion.  Today however, patient's shortness of breath was worse than usual in the evening when he was doing some chores.  After dinner, patient started having some indigestion type chest discomfort, described as midsternal chest pain which was radiating to the shoulder blades.  Patient took some antacids, and the symptoms did not resolve so he decided to come to the ER.  Patient denies any diaphoresis, dizziness, however he says that his legs felt weaker than usual all day today.  Past Medical History:  Diagnosis Date  . Adenomatous colon polyp 05/01/2015   tubular  . Anemia   . Antritis (stomach)    mild  . Anxiety   . Aortic stenosis   . Arthritis    "knees, hips, shoulders" (03/15/2014)  . Axillary adenopathy 02/25/2017  . CAD (coronary artery disease)    bypass 2001  . Chronic lower back pain   . Diverticulosis    mild, left colon  . Duodenitis    chronic  . Esophageal stricture    X 4  . GERD (gastroesophageal reflux disease)   . Gout   . Heart murmur    Echo in july 2018  . History of gout   . HNP (herniated nucleus pulposus), lumbar   . HTN (hypertension)   . Iron deficiency   . Lymphoma ([place])   . Migraines    "til age 77 yrs"  . Mixed  hyperlipidemia   . OSA on CPAP    with 2L O2 at night  . Peptic stricture of esophagus   . Skin cancer    "right neck cut off; several frozen off my arms" (03/15/2014)  . Sliding hiatal hernia   . Spinal stenosis   . Type II diabetes mellitus ([place])   . Vitamin B12 deficiency   . Wenckebach block    a. brady down to 30s due to frequent block, metoprolol held in response.    Patient Active Problem List   Diagnosis Date Noted  . 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 ([place]) 02/27/2017  . Axillary adenopathy 02/25/2017  . Bradycardia 01/04/2017  . Coronary artery disease involving native coronary artery of native heart without angina pectoris 01/04/2017  . Stenosis of carotid artery 01/04/2017  . Obesity 09/18/2016  . Edema 10/09/2015  . Hearing loss of left ear due to cerumen impaction 03/20/2015  . Otitis media of left ear 03/20/2015  . Costochondritis 03/29/2014  . Wenckebach block   . Lightheadedness 03/15/2014  . Undiagnosed cardiac murmurs 09/27/2013  . Elevated alkaline phosphatase level 09/27/2013  . Spinal stenosis, lumbar region, with neurogenic claudication 06/17/2013  . Synovial cyst of lumbar facet joint 06/17/2013  . Type 2 diabetes mellitus with diabetic neuropathy, without long-term current  use of insulin ([place]) 05/21/2013  . Spinal stenosis of lumbar region 04/08/2013  . Personal history of adenomatous colonic polyps 05/05/2012  . Anemia, iron deficiency 11/29/2011  . B12 deficiency anemia 07/30/2011  . OSA (obstructive sleep apnea) 07/06/2010  . DEGENERATIVE JOINT DISEASE, LEFT KNEE 03/20/2010  . PARESTHESIA 11/02/2009  . CAD, ARTERY BYPASS GRAFT 08/22/2009  . BRONCHITIS, OBSTRUCTIVE CHRONIC 08/15/2009  . Essential hypertension 03/29/2009  . Bilateral lower extremity edema 02/21/2009  . HYPERPLASIA PROSTATE UNS W/UR OBST & OTH LUTS 12/12/2008  . NOCTURIA 12/12/2008  . Hyperlipidemia LDL goal <70 11/26/2008  . RHINITIS 10/10/2008  . GERD,  SEVERE 08/15/2008  . GOUT 08/17/2006    Past Surgical History:  Procedure Laterality Date  . APPENDECTOMY  ~ 1952  . BACK SURGERY    . CATARACT EXTRACTION W/PHACO Left 11/06/2016   Procedure: CATARACT EXTRACTION PHACO AND INTRAOCULAR LENS PLACEMENT (IOC);  Surgeon: Estill Cotta, MD;  Location: [facility] ORS;  Service: Ophthalmology;  Laterality: Left;  Lot # [id number] H [place]: 01:09.4 AP%:25.2 CDE: 30.64  . CATARACT EXTRACTION W/PHACO Right 12/04/2016   Procedure: CATARACT EXTRACTION PHACO AND INTRAOCULAR LENS PLACEMENT (IOC);  Surgeon: Estill Cotta, MD;  Location: [facility] ORS;  Service: Ophthalmology;  Laterality: Right;  [place] 1:25.9 AP% 24.1 CDE 39.10 Fluid Pack lot # [id number]  . COLONOSCOPY W/ BIOPSIES AND POLYPECTOMY  2013  . CORONARY ANGIOPLASTY  1993  . CORONARY ANGIOPLASTY WITH STENT PLACEMENT  05/1997   "1"  . CORONARY ARTERY BYPASS GRAFT  03/2000   "CABG X5"  . ESOPHAGEAL DILATION  X 3-4   Dr. Lyla Son; "last one was in the 1990's"  . ESOPHAGOGASTRODUODENOSCOPY     multiple  . FLEXIBLE SIGMOIDOSCOPY     multiple  . HYDRADENITIS EXCISION Left 02/25/2017   Procedure: EXCISION DEEP LEFT AXILLARY LYMPH NODE;  Surgeon: Fanny Skates, MD;  Location: [facility];  Service: General;  Laterality: Left;  . KNEE ARTHROSCOPY Left 2011   meniscus repair  . LEFT HEART CATHETERIZATION WITH CORONARY/GRAFT ANGIOGRAM N/A 03/16/2014   Procedure: LEFT HEART CATHETERIZATION WITH Beatrix Fetters;  Surgeon: Burnell Blanks, MD;  Location: [facility] CATH LAB;  Service: Cardiovascular;  Laterality: N/A;  . LUMBAR LAMINECTOMY/DECOMPRESSION MICRODISCECTOMY Right 06/17/2013   Procedure: LUMBAR LAMINECTOMY MICRODISCECTOMY L4-L5 RIGHT EXCISION OF SYNOVIAL CYST RIGHT   (1 LEVEL) RIGHT PARTIAL FACETECTOMY;  Surgeon: Tobi Bastos, MD;  Location: [facility] ORS;  Service: Orthopedics;  Laterality: Right;  . MYELOGRAM  04/06/13   lumbar, Dr Gladstone Lighter  . PANENDOSCOPY    . PORTACATH PLACEMENT N/A 03/06/2017    Procedure: INSERTION PORT-A-CATH AND ASPIRATE SEROMA LEFT AXILLA;  Surgeon: Fanny Skates, MD;  Location: [facility];  Service: General;  Laterality: N/A;  . SHOULDER SURGERY Right 08/2010   screws placed; "tendons tore off"  . SKIN CANCER EXCISION Right    "neck"  . TONSILLECTOMY  ~ 1954  . UPPER GI ENDOSCOPY  2013   Gastritis; Dr Carlean Purl  . VASECTOMY         Home Medications    Prior to Admission medications   Medication Sig Start Date End Date Taking? Authorizing Provider  allopurinol (ZYLOPRIM) 300 MG tablet Take 450 mg by mouth daily.     [provider]  amLODipine (NORVASC) 5 MG tablet TAKE 2 TABLETS (10 MG TOTAL) BY MOUTH DAILY. Patient taking differently: TAKE 1 TABLETS BY MOUTH TWICE DAILY. 01/15/17   Minus Breeding, MD  amoxicillin-clavulanate (AUGMENTIN) 875-125 MG tablet Take 1 tablet by mouth 2 (two) times daily.  07/18/17   [name], MD  aspirin 81 MG tablet Take 81 mg by mouth daily.    [provider]  atorvastatin (LIPITOR) 80 MG tablet TAKE ONE TABLET BY MOUTH AT BEDTIME 01/29/17   Minus Breeding, MD  diphenoxylate-atropine (LOMOTIL) 2.5-0.025 MG tablet Take 2 pills at the onset of diarrhea, then take one pill after every loose stool. Maximum 8 pills per 24h. 04/04/17   [name], MD  esomeprazole (NEXIUM) 40 MG capsule Take 1 capsule (40 mg total) by mouth daily. 10/15/12   [name], MD  ezetimibe (ZETIA) 10 MG tablet TAKE ONE TABLET BY MOUTH DAILY 01/15/17   Minus Breeding, MD  famciclovir (FAMVIR) 500 MG tablet Take 1 tablet (500 mg total) by mouth daily. 02/27/17   [name], MD  fluticasone (FLONASE) 50 MCG/ACT nasal spray INHALE ONE SPRAY INTO EACH NOSTRIL TWICE DAILY. 05/05/17   [name], DO  furosemide (LASIX) 40 MG tablet Take 3 tablets (120 mg total) 2 (two) times daily by mouth. 05/13/17   Almyra Deforest, PA  glimepiride (AMARYL) 2 MG tablet TAKE 1 TABLET (2 MG TOTAL) BY MOUTH DAILY WITH BREAKFAST. 05/16/17    Roma Schanz R, DO  glucose 5 g chewable tablet Chew 15 g by mouth as needed for low blood sugar.    [provider]  glucose blood (FREESTYLE LITE) test strip TEST BLOOD SUGAR bid 04/03/17   Carollee Herter, Alferd Apa, DO  Lancets (FREESTYLE) lancets Use BID to check blood sugar.  DX E11.9 04/03/17   [name], DO  lidocaine-prilocaine (EMLA) cream Apply to affected area once 03/04/17   Ennever, Rudell Cobb, MD  lipase/protease/amylase (CREON) 36000 UNITS CPEP capsule Take 2 capsules (72,000 Units total) by mouth 3 (three) times daily with meals. 1 capsule with snack. 05/30/17   [place], Holli Humbles, NP  LORazepam (ATIVAN) 0.5 MG tablet Take 1 tablet (0.5 mg total) by mouth every 6 (six) hours as needed (Nausea or vomiting). Patient not taking: Reported on 06/06/2017 03/04/17   [name], MD  Menthol, Topical Analgesic, (ICY HOT EX) Apply 1 application topically daily as needed (muscle pain).    [provider]  metFORMIN (GLUCOPHAGE) 1000 MG tablet TAKE ONE AND ONE-HALF TABLETS BY MOUTH EVERY MORNING AND 1 TALBET IN THE EVENING. 02/24/17   Carollee Herter, Alferd Apa, DO  metoprolol tartrate (LOPRESSOR) 25 MG tablet TAKE ONE-HALF TABLET BY MOUTH TWICE DAILY Patient taking differently: TAKE ONE-HALF TABLET BY MOUTH IN THE MORNING AND HALF TABLET IN THE EVENING 01/15/17   Minus Breeding, MD  nitroGLYCERIN (NITROSTAT) 0.4 MG SL tablet Place 1 tablet (0.4 mg total) under the tongue every 5 (five) minutes as needed for chest pain. 09/18/16   Minus Breeding, MD  ondansetron (ZOFRAN) 8 MG tablet Take 1 tablet (8 mg total) by mouth 2 (two) times daily as needed for refractory nausea / vomiting. Start on day 3 after cyclophosphamide chemotherapy. Patient not taking: Reported on 06/06/2017 03/04/17   [name], MD  polycarbophil (FIBERCON) 625 MG tablet Take 625 mg by mouth daily.    [provider]  potassium chloride SA (KLOR-CON M20) 20 MEQ tablet Take 2 tablets (40 mEq  total) 2 (two) times daily by mouth. 05/13/17   Almyra Deforest, PA  predniSONE (DELTASONE) 20 MG tablet Take 3 pills a day, with food, for 5 days. Start the day after each chemotherapy 02/27/17   [name], MD  pregabalin (  LYRICA) 75 MG capsule Take 1 capsule (75 mg total) by mouth 3 (three) times daily. 04/03/17   [name], DO  prochlorperazine (COMPAZINE) 10 MG tablet Take 1 tablet (10 mg total) by mouth every 6 (six) hours as needed (Nausea or vomiting). Patient not taking: Reported on 06/06/2017 03/04/17   [name], MD    Family History Family History  Problem Relation Age of Onset  . Stroke Father   . Hypertension Father   . Pancreatic cancer Mother   . Diabetes Maternal Grandmother   . Stroke Maternal Grandmother   . Heart attack Paternal Grandmother   . Colon cancer Neg Hx   . Esophageal cancer Neg Hx   . Rectal cancer Neg Hx   . Stomach cancer Neg Hx   . Ulcers Neg Hx     Social History Social History   Tobacco Use  . Smoking status: Never Smoker  . Smokeless tobacco: Never Used  Substance Use Topics  . Alcohol use: No    Alcohol/week: 0.0 oz    Comment: "last drink was in 2012"( 03/15/2014)  . Drug use: No     Allergies   Benazepril; Hctz [hydrochlorothiazide]; Aspirin; and Lactose intolerance (gi)   Review of Systems Review of Systems  Constitutional: Positive for activity change.  Respiratory: Positive for shortness of breath.   Cardiovascular: Positive for chest pain.  Neurological: Negative for dizziness.  All other systems reviewed and are negative.    Physical Exam Updated Vital Signs BP 105/62   Pulse (!) 32   Temp (!) 97.4 F (36.3 C) (Oral)   Resp 18   Ht 6' (1.829 m)   Wt 111.1 kg (245 lb)   SpO2 97%   BMI 33.23 kg/m   Physical Exam  Constitutional: He is oriented to person, place, and time. He appears well-developed.  HENT:  Head: Atraumatic.  Neck: Neck supple.  Cardiovascular: Normal rate.  Murmur  heard. Pulmonary/Chest: Effort normal and breath sounds normal.  Abdominal: Soft. There is no tenderness.  Musculoskeletal:       Right lower leg: He exhibits no edema.       Left lower leg: He exhibits no edema.  Neurological: He is alert and oriented to person, place, and time.  Skin: Skin is warm.  Nursing note and vitals reviewed.    ED Treatments / Results  Labs (all labs ordered are listed, but only abnormal results are displayed) Labs Reviewed  BASIC METABOLIC PANEL - Abnormal; Notable for the following components:      Result Value   CO2 21 (*)    Glucose, Bld 171 (*)    BUN 29 (*)    Calcium 8.6 (*)    All other components within normal limits  CBC - Abnormal; Notable for the following components:   WBC 2.6 (*)    RBC 3.52 (*)    Hemoglobin 9.8 (*)    HCT 31.5 (*)    RDW 17.5 (*)    Platelets 75 (*)    All other components within normal limits  I-STAT TROPONIN, ED - Abnormal; Notable for the following components:   Troponin i, poc 0.09 (*)    All other components within normal limits    EKG  EKG Interpretation  Date/Time:  Wednesday July 23 2017 22:02:44 EST Ventricular Rate:  76 PR Interval:  178 QRS Duration: 90 QT Interval:  364 QTC Calculation: 409 R Axis:   54 Text Interpretation:  Sinus rhythm with marked sinus  arrhythmia Otherwise normal ECG No acute changes No significant change since last tracing Confirmed by Varney Biles [phone]) on 07/23/2017 11:07:01 PM       Radiology Dg Chest 2 View  Result Date: 07/23/2017 CLINICAL DATA:  Chest pain EXAM: CHEST  2 VIEW COMPARISON:  PET-CT 04/15/2017, radiograph 03/06/2017 FINDINGS: Right-sided central venous port tip overlies the SVC. Post sternotomy changes. Cardiomegaly. Mild coarse likely chronic interstitial opacity. No acute consolidation or effusion. Aortic atherosclerosis. No pneumothorax. Surgical clips in the left axilla. IMPRESSION: No active cardiopulmonary disease.  Stable mild  cardiomegaly. Electronically Signed   By: Donavan Foil M.D.   On: 07/23/2017 22:22    Procedures Procedures (including critical care time)  CRITICAL CARE Performed by: Jathen Sudano   Total critical care time: 34 minutes  Critical care time was exclusive of separately billable procedures and treating other patients.  Critical care was necessary to treat or prevent imminent or life-threatening deterioration.  Critical care was time spent personally by me on the following activities: development of treatment plan with patient and/or surrogate as well as nursing, discussions with consultants, evaluation of patient's response to treatment, examination of patient, obtaining history from patient or surrogate, ordering and performing treatments and interventions, ordering and review of laboratory studies, ordering and review of radiographic studies, pulse oximetry and re-evaluation of patient's condition.   Medications Ordered in ED Medications  aspirin chewable tablet 324 mg (not administered)     Initial Impression / Assessment and Plan / ED Course  I have reviewed the triage vital signs and the nursing notes.  Pertinent labs & imaging results that were available during my care of the patient were reviewed by me and considered in my medical decision making (see chart for details).     77 year old male with history of severe aortic stenosis and coronary artery disease, status post CABG, being medically managed for his existing coronary artery disease comes in with chief complaint of chest pain and shortness of breath.  Patient typically gets dyspnea on exertion with his aortic stenosis, however today he had some chest tightness and discomfort around the shoulder blades.  Patient has history of gastritis and he has taken antacid without any significant relief.  Clinical concerns are that patient is having ischemic type pain, and possibly worsening aortic stenosis.  Given the history of  cancer, we considered PE in the differential diagnosis, however pretest probability for PE is extremely low compared to ACS/AS.  Patient also has history of gastritis, but he had no resolution of symptoms with antacid intake.  Troponin is slightly elevated.  EKG does not show any acute finding. Troponin could be type II and STEMI secondary to aortic stenosis.   Spoke with cardiology, they are okay with patient not getting any nitroglycerin.  Patient is noted to be thrombocytopenic.  He will get aspirin right now, we will hold off on heparin for now.  Thrombus cytopenia likely due to chemotherapy.  Final Clinical Impressions(s) / ED Diagnoses   Final diagnoses:  NSTEMI (non-ST elevated myocardial infarction) [facility])  Aortic stenosis, severe  Thrombocytopenia [facility])    ED Discharge Orders    None       Varney Biles, MD 07/23/17 2341

## 2017-07-23 NOTE — ED Notes (Signed)
Dr. Sherry Ruffing notified of Trop 0.09.  Pt to go to next treatment room.

## 2017-07-23 NOTE — ED Triage Notes (Signed)
Pt c/o chest pain with exertion that radiates to the back and shortness of breath. Pt reports he has one more chemo treatment and then he can "have heart valve replaced". Denies nausea/vomiting/diaphoresis.

## 2017-07-24 ENCOUNTER — Other Ambulatory Visit: Payer: Self-pay

## 2017-07-24 ENCOUNTER — Encounter (HOSPITAL_COMMUNITY): Payer: Self-pay | Admitting: Physician Assistant

## 2017-07-24 DIAGNOSIS — D649 Anemia, unspecified: Secondary | ICD-10-CM

## 2017-07-24 DIAGNOSIS — D61818 Other pancytopenia: Secondary | ICD-10-CM

## 2017-07-24 DIAGNOSIS — I200 Unstable angina: Secondary | ICD-10-CM

## 2017-07-24 DIAGNOSIS — I350 Nonrheumatic aortic (valve) stenosis: Principal | ICD-10-CM

## 2017-07-24 DIAGNOSIS — R748 Abnormal levels of other serum enzymes: Secondary | ICD-10-CM | POA: Diagnosis not present

## 2017-07-24 LAB — BASIC METABOLIC PANEL
Anion gap: 11 (ref 5–15)
BUN: 23 mg/dL — ABNORMAL HIGH (ref 6–20)
CO2: 24 mmol/L (ref 22–32)
Calcium: 8 mg/dL — ABNORMAL LOW (ref 8.9–10.3)
Chloride: 104 mmol/L (ref 101–111)
Creatinine, Ser: 0.86 mg/dL (ref 0.61–1.24)
GFR calc Af Amer: >60 mL/min (ref >60)
GFR calc non Af Amer: >60 mL/min (ref >60)
Glucose, Bld: 103 mg/dL — ABNORMAL HIGH (ref 65–99)
Potassium: 4.1 mmol/L (ref 3.5–5.1)
Sodium: 139 mmol/L (ref 135–145)

## 2017-07-24 LAB — CBC
HCT: 27.8 % — ABNORMAL LOW (ref 39.0–52.0)
Hemoglobin: 8.7 g/dL — ABNORMAL LOW (ref 13.0–17.0)
MCH: 28.2 pg (ref 26.0–34.0)
MCHC: 31.3 g/dL (ref 30.0–36.0)
MCV: 90.3 fL (ref 78.0–100.0)
Platelets: 55 10*3/uL — ABNORMAL LOW (ref 150–400)
RBC: 3.08 MIL/uL — ABNORMAL LOW (ref 4.22–5.81)
RDW: 17.8 % — ABNORMAL HIGH (ref 11.5–15.5)
WBC: 1.2 10*3/uL — CL (ref 4.0–10.5)

## 2017-07-24 LAB — TROPONIN I
Troponin I: 0.1 ng/mL (ref <0.03)
Troponin I: 0.1 ng/mL (ref <0.03)

## 2017-07-24 MED ORDER — NITROGLYCERIN 0.4 MG SL SUBL: 0.4000 mg | SUBLINGUAL_TABLET | SUBLINGUAL | Status: DC | PRN

## 2017-07-24 MED ORDER — PANCRELIPASE (LIP-PROT-AMYL) 12000-38000 UNITS PO CPEP
72000.0000 [IU] | ORAL_CAPSULE | Freq: Three times a day (TID) | ORAL | Status: DC
Start: 2017-07-24 — End: 2017-07-29
  Administered 2017-07-24 – 2017-07-29 (×14): 72000 [IU] via ORAL
  Filled 2017-07-24: qty 6
  Filled 2017-07-24 (×2): qty 2
  Filled 2017-07-24: qty 6
  Filled 2017-07-24: qty 2
  Filled 2017-07-24 (×2): qty 6
  Filled 2017-07-24 (×11): qty 2

## 2017-07-24 MED ORDER — PANCRELIPASE (LIP-PROT-AMYL) 12000-38000 UNITS PO CPEP
36000.0000 [IU] | ORAL_CAPSULE | ORAL | Status: DC | PRN
  Filled 2017-07-24: qty 3

## 2017-07-24 MED ORDER — MAGNESIUM HYDROXIDE 400 MG/5ML PO SUSP
15.0000 mL | Freq: Every day | ORAL | Status: DC | PRN
  Administered 2017-07-24 – 2017-07-27 (×2): 15 mL via ORAL
  Filled 2017-07-24 (×2): qty 30

## 2017-07-24 MED ORDER — FUROSEMIDE 80 MG PO TABS
120.0000 mg | ORAL_TABLET | Freq: Two times a day (BID) | ORAL | Status: DC
  Administered 2017-07-24 – 2017-07-29 (×11): 120 mg via ORAL
  Filled 2017-07-24 (×11): qty 1

## 2017-07-24 MED ORDER — ONDANSETRON HCL 4 MG/2ML IJ SOLN: 4.0000 mg | Freq: Four times a day (QID) | INTRAMUSCULAR | Status: DC | PRN

## 2017-07-24 MED ORDER — LORAZEPAM 0.5 MG PO TABS: 0.5000 mg | ORAL_TABLET | Freq: Four times a day (QID) | ORAL | Status: DC | PRN

## 2017-07-24 MED ORDER — ASPIRIN EC 81 MG PO TBEC
81.0000 mg | DELAYED_RELEASE_TABLET | Freq: Every day | ORAL | Status: DC
  Administered 2017-07-25 – 2017-07-29 (×5): 81 mg via ORAL
  Filled 2017-07-24 (×4): qty 1

## 2017-07-24 MED ORDER — PANTOPRAZOLE SODIUM 40 MG PO TBEC
40.0000 mg | DELAYED_RELEASE_TABLET | Freq: Every day | ORAL | Status: DC
Start: 2017-07-24 — End: 2017-07-29
  Administered 2017-07-24 – 2017-07-29 (×5): 40 mg via ORAL
  Filled 2017-07-24 (×6): qty 1

## 2017-07-24 MED ORDER — AMLODIPINE BESYLATE 10 MG PO TABS
10.0000 mg | ORAL_TABLET | Freq: Every day | ORAL | Status: DC
  Administered 2017-07-24 – 2017-07-29 (×5): 10 mg via ORAL
  Filled 2017-07-24 (×6): qty 1

## 2017-07-24 MED ORDER — EZETIMIBE 10 MG PO TABS
10.0000 mg | ORAL_TABLET | Freq: Every day | ORAL | Status: DC
  Administered 2017-07-24 – 2017-07-29 (×5): 10 mg via ORAL
  Filled 2017-07-24 (×6): qty 1

## 2017-07-24 MED ORDER — ACETAMINOPHEN 325 MG PO TABS: 650.0000 mg | ORAL_TABLET | ORAL | Status: DC | PRN

## 2017-07-24 MED ORDER — ALLOPURINOL 300 MG PO TABS
450.0000 mg | ORAL_TABLET | Freq: Every day | ORAL | Status: DC
  Administered 2017-07-24 – 2017-07-29 (×5): 450 mg via ORAL
  Filled 2017-07-24 (×7): qty 2

## 2017-07-24 MED ORDER — PREGABALIN 50 MG PO CAPS
75.0000 mg | ORAL_CAPSULE | Freq: Three times a day (TID) | ORAL | Status: DC
  Administered 2017-07-24 – 2017-07-28 (×13): 75 mg via ORAL
  Filled 2017-07-24 (×14): qty 1

## 2017-07-24 MED ORDER — ATORVASTATIN CALCIUM 80 MG PO TABS
80.0000 mg | ORAL_TABLET | Freq: Every day | ORAL | Status: DC
  Administered 2017-07-24 – 2017-07-28 (×5): 80 mg via ORAL
  Filled 2017-07-24 (×5): qty 1

## 2017-07-24 NOTE — Progress Notes (Signed)
Pt is in A.Flutter at this time. Contacted [facility] for update.

## 2017-07-24 NOTE — Progress Notes (Signed)
Pt has been going from 1st degree heart block, A.fib controlled at 2320, and A.Flutter. [facility] notified.

## 2017-07-24 NOTE — Care Management Obs Status (Signed)
[facility] NOTIFICATION   Patient Details  Name: Richard [name] MRN: [id number] Date of Birth: [birth year]   Medicare Observation Status Notification Given:  Yes    Carles Collet, RN 07/24/2017, 2:27 PM

## 2017-07-24 NOTE — Progress Notes (Signed)
Pt went into SB from A.Flutter.

## 2017-07-24 NOTE — Progress Notes (Signed)
Pt wore CPAP throughout the night and tolerated well. Pt stated that he rested well throughout the night.

## 2017-07-24 NOTE — Consult Note (Addendum)
[facility]                                        Inpatient TAVR Consultation:   Patient ID: ALIXANDER RALLIS; [id number]; [birth year]   Admit date: 07/23/2017 Date of Consult: 07/25/2017  Primary Care Provider: [name], DO Primary Cardiologist: Dr. Percival Spanish    Patient Profile:   SERAPHIM TROW is a 77 y.o. male with a hx of CAD s/p CABG x5V (2001), GERD, HTN, HLD, DMT2, obesity, OSA on CPAP, chronic diastolic CHF, diffuse large B cell lymphoma in remission undergoing chemo who is being seen today for the evaluation of severe AS at the request of Dr. Percival Spanish.  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 He stopped working about 11 years ago but still stays very active playing golf. He is not married but has two local children. He lives alone and is independent but has been staying with his daughter recently during his chemotherapy. He has partial dentures but gets regular dental cleanings and has no history of nickel allergy. He is a never smoker.   He underwent CABG x 5V (LIMA to LAD, SVG to OM1/OM 2, SVG to distal RCA and SVG to D1) in 2001 by Dr. Cyndia Bent. His last cardiac catheterization on 03/16/2014 showed 100% mid LAD chronic occlusion, patent LIMA to distal LAD, diffuse 90% stenosis in OM1, occluded OM 2, patent sequential SVG to OM1/OM 2, 99% proximal RCA stenosis with 100% mid RCA occlusion followed by a patent graft to RCA. He does have occluded SVG to diagonal branch.   Review of previous echos show that a murmur was detected on exam back in 09/2013. Echo at that time showed normal EF with a mildly thickened and calcified AV but normal cusp separation and mild TR.   He was last seen by Dr. Magnus Ivan 01/03/2017. He reported that he occasionally had HRs in the 30s, but no presyncope or syncope. Also a murmur suspicious for AS was heard on exam. Holter  monitor showed occasional transient 2-1 heart block that was asymptomatic, therefore he was continued on his low dose BB. 2D ECHO 12/2016 showed EF 60-65%, severe AS with a mean gradient of 40 mm Hg and AVA 0.78, mild MR, mod RV dilation. He was seen in the office by Almyra Deforest PA-C and reported being asymptomatic and regularly playing golf with no issues. Continued monitoring was recommended.   Shortly after that time he was diagnosed with diffuse large cell non-Hodgkin's lymphoma through biopsy of the left axillary lymph node in 01/2017. He underwent Port-A-Cath placement on 03/06/2017 and was subsequently started on chemotherapy (Adriamycin, vincristine, cytoxan, rituxan). Last dose 07/18/17 with plans for last session 08/08/17. He is being followed by Dr. Marin Olp.   He was then seen back in the cardiology office by [facility] in 05/2017 for evaluation of worsening LE edema and orthopnea and found to have acute CHF. His lasix was increased to 120mg  BID. Repeat 2D ECHO 05/16/17 revealed worsening of his AS with a mean gradient of 54 mm Hg, peak gradient 97 mm Hg, DVI 22, and AVA 0.82 cm^2.    He was seen again by Almyra Deforest on 06/05/17 for dyspnea and chest pain. Plan was to return to see Dr. Percival Spanish in 6 weeks to  discuss TAVR, after finishing chemotherapy.   He then presented to [facility] on 07/24/17 with chest pain and dyspnea at rest. He noted a gradual worsening in his exercise tolerance over the past several months. He had now gotten to the point that he cannot walk even short distances without significant dyspnea and chest tightness.  Symptoms usually resolve with rest, but on the afternoon of admission he had significant dyspnea and chest pain while watching TV prompting him to seek care in the [facility] ED.   In the ED, his vital signs were within normal limits.  His labs were notable for troponin of 0.09, pancytopenia with a white count of 2.6, and platelets of 75.  His EKG showed Wenckebach with no acute ischemic changes.   He was then admitted to the cardiology service for further management. His troponin has been mildly elevated 0.10--> 0.10. CBC today shows worsening pancytopenia, WBC 0.3, Hg 10.1, PLT 49. Plan is to start TAVR work up but currently his blood counts are too low to safely do a heart catheterization. The multidisciplinary valve team is asked to weigh in on candidacy and timing of TAVR. Dr. Percival Spanish has spoken to Dr. Marin Olp who said his lymphoma prognosis is excellent and recent PET scan showed he is remission.   Currently Mr. Buenger is doing quite well. He feels okay with rest with no chest pain or dyspnea. No s/s volume overload at this time. No dizziness or syncope.    Past Medical History:  Diagnosis Date  . Anemia   . Axillary adenopathy 02/25/2017  . Bradycardia    a. holter monitor has demonstrated HRs in 30s and Weinkibach   . CAD (coronary artery disease)    a. s/p CABG 2001  . Chronic lower back pain   . Diffuse large B cell lymphoma ([place])   . Diverticulosis   . Esophageal stricture   . GERD (gastroesophageal reflux disease)   . History of gout   . HTN (hypertension)   . Mixed hyperlipidemia   . OSA on CPAP    with 2L O2 at night  . Pancytopenia ([place])    a. related to chemo therapy for B cell lymphoma  . Peptic stricture of esophagus   . Severe aortic stenosis   . Spinal stenosis   . Type II diabetes mellitus ([place])     Past Surgical History:  Procedure Laterality Date  . APPENDECTOMY  ~ 1952  . BACK SURGERY    . CATARACT EXTRACTION W/PHACO Left 11/06/2016   Procedure: CATARACT EXTRACTION PHACO AND INTRAOCULAR LENS PLACEMENT (IOC);  Surgeon: Estill Cotta, MD;  Location: [facility] ORS;  Service: Ophthalmology;  Laterality: Left;  Lot # [id number] H [place]: 01:09.4 AP%:25.2 CDE: 30.64  . CATARACT EXTRACTION W/PHACO Right 12/04/2016   Procedure: CATARACT EXTRACTION PHACO AND INTRAOCULAR LENS PLACEMENT (IOC);  Surgeon: Estill Cotta, MD;  Location: [facility] ORS;  Service:  Ophthalmology;  Laterality: Right;  [place] 1:25.9 AP% 24.1 CDE 39.10 Fluid Pack lot # [id number]  . COLONOSCOPY W/ BIOPSIES AND POLYPECTOMY  2013  . CORONARY ANGIOPLASTY  1993  . CORONARY ANGIOPLASTY WITH STENT PLACEMENT  05/1997   "1"  . CORONARY ARTERY BYPASS GRAFT  03/2000   "CABG X5"  . ESOPHAGEAL DILATION  X 3-4   Dr. Lyla Son; "last one was in the 1990's"  . ESOPHAGOGASTRODUODENOSCOPY     multiple  . FLEXIBLE SIGMOIDOSCOPY     multiple  . HYDRADENITIS EXCISION Left 02/25/2017   Procedure: EXCISION DEEP LEFT AXILLARY LYMPH NODE;  Surgeon: Fanny Skates, MD;  Location: [place];  Service: General;  Laterality: Left;  . KNEE ARTHROSCOPY Left 2011   meniscus repair  . LEFT HEART CATHETERIZATION WITH CORONARY/GRAFT ANGIOGRAM N/A 03/16/2014   Procedure: LEFT HEART CATHETERIZATION WITH Beatrix Fetters;  Surgeon: Burnell Blanks, MD;  Location: [facility] CATH LAB;  Service: Cardiovascular;  Laterality: N/A;  . LUMBAR LAMINECTOMY/DECOMPRESSION MICRODISCECTOMY Right 06/17/2013   Procedure: LUMBAR LAMINECTOMY MICRODISCECTOMY L4-L5 RIGHT EXCISION OF SYNOVIAL CYST RIGHT   (1 LEVEL) RIGHT PARTIAL FACETECTOMY;  Surgeon: Tobi Bastos, MD;  Location: [facility] ORS;  Service: Orthopedics;  Laterality: Right;  . MYELOGRAM  04/06/13   lumbar, Dr Gladstone Lighter  . PANENDOSCOPY    . PORTACATH PLACEMENT N/A 03/06/2017   Procedure: INSERTION PORT-A-CATH AND ASPIRATE SEROMA LEFT AXILLA;  Surgeon: Fanny Skates, MD;  Location: [place];  Service: General;  Laterality: N/A;  . SHOULDER SURGERY Right 08/2010   screws placed; "tendons tore off"  . SKIN CANCER EXCISION Right    "neck"  . TONSILLECTOMY  ~ 1954  . UPPER GI ENDOSCOPY  2013   Gastritis; Dr Carlean Purl  . VASECTOMY       Inpatient Medications: Scheduled Meds: . allopurinol  450 mg Oral Daily  . amLODipine  10 mg Oral Daily  . aspirin EC  81 mg Oral Daily  . atorvastatin  80 mg Oral QHS  . ezetimibe  10 mg Oral Daily  . furosemide  120 mg Oral BID    . lipase/protease/amylase  72,000 Units Oral TID WC  . pantoprazole  40 mg Oral Daily  . pregabalin  75 mg Oral TID  . sodium chloride flush  3 mL Intravenous Q12H   Continuous Infusions: . sodium chloride    . sodium chloride 10 mL/hr at 07/25/17 0523   PRN Meds: sodium chloride, acetaminophen, lipase/protease/amylase, LORazepam, magnesium hydroxide, nitroGLYCERIN, ondansetron (ZOFRAN) IV, sodium chloride flush  Allergies:    Allergies  Allergen Reactions  . Benazepril Swelling    angioedema; he is not a candidate for any angiotensin receptor blockers because of this significant allergic reaction. Because of a history of documented adverse serious drug reaction;Medi Alert bracelet  is recommended  . Hctz [Hydrochlorothiazide] Anaphylaxis and Swelling    Tongue and lip swelling   . Aspirin Other (See Comments)    Gastritis, cant take 325 Mg aspirin   . Lactose Intolerance (Gi) Nausea And Vomiting    Social History:   Social History   Socioeconomic History  . Marital status: Divorced    Spouse name: Not on file  . Number of children: 2  . Years of education: Not on file  . Highest education level: Not on file  Social Needs  . Financial resource strain: Not on file  . Food insecurity - worry: Not on file  . Food insecurity - inability: Not on file  . Transportation needs - medical: Not on file  . Transportation needs - non-medical: Not on file  Occupational History    Employer: RETIRED  Tobacco Use  . Smoking status: Never Smoker  . Smokeless tobacco: Never Used  Substance and Sexual Activity  . Alcohol use: No    Alcohol/week: 0.0 oz    Comment: "last drink was in 2012"( 03/15/2014)  . Drug use: No  . Sexual activity: Not Currently  Other Topics Concern  . Not on file  Social History Narrative   Divorced, lives with a roommate.   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    Family History:   The patient's family history includes Diabetes in his maternal grandmother; Heart attack in his paternal grandmother; Hypertension in his father; Pancreatic cancer in his mother; Stroke in his father and maternal grandmother. There is no history of Colon cancer, Esophageal cancer, Rectal cancer, Stomach cancer, or Ulcers.  ROS:  Please see the history of present illness.  ROS  All other ROS reviewed and negative.     Physical Exam/Data:   Vitals:   07/24/17 1939 07/24/17 2356 07/25/17 0418 07/25/17 0750  BP: (!) 111/55 117/80 126/66 (!) 103/53  Pulse:  69 98   Resp: 18 19 18    Temp: 97.8 F (36.6 C) 97.9 F (36.6 C) 97.8 F (36.6 C) 98.8 F (37.1 C)  TempSrc: Oral Oral Oral Oral  SpO2: 96% 94% 95% 96%  Weight:   245 lb 4.8 oz (111.3 kg)   Height:        Intake/Output Summary (Last 24 hours) at 07/25/2017 0823 Last data filed at 07/25/2017 0422 Gross per 24 hour  Intake 510 ml  Output 1900 ml  Net -1390 ml   Filed Weights   07/23/17 2203 07/24/17 0030 07/25/17 0418  Weight: 245 lb (111.1 kg) 255 lb 1.6 oz (115.7 kg) 245 lb 4.8 oz (111.3 kg)   Body mass index is 33.27 kg/m.  General:  Well nourished, well developed, in no acute distress, obese HEENT: normal Lymph: no adenopathy Neck: no JVD Endocrine:  No thryomegaly Vascular: No carotid bruits; FA pulses 2+ bilaterally without bruits  Cardiac:  normal S1, S2; RRR; 3/6 SEM @ RUSB Lungs:  clear to auscultation bilaterally, no wheezing, rhonchi or rales  Abd: soft, nontender, no hepatomegaly  Ext: mild LE edema Musculoskeletal:  No deformities, BUE and BLE strength normal and equal Skin: warm and dry  Neuro:  CNs 2-12 intact, no focal abnormalities noted Psych:  Normal affect   EKG:  The EKG was personally reviewed and demonstrates: SR with mobitz type I Telemetry:  Telemetry was personally reviewed and  demonstrates: sinus with 1st deg AV block and mobitz type I  Relevant CV Studies: 2D ECHO 05/16/17 Study Conclusions - HPI and indications: Severe Aortic Stenosis (prior Mean Gradient   9mmHg, 01-16-17) - Left ventricle: The cavity size was normal. Systolic function was   normal. The estimated ejection fraction was in the range of 55%   to 60%. Wall motion was normal; there were no regional wall   motion abnormalities. Doppler parameters are consistent with   abnormal left ventricular relaxation (grade 1 diastolic   dysfunction). GLS: -17.2% - Aortic valve: Valve mobility was restricted. There was severe   stenosis. There was trivial regurgitation. Peak velocity (S): 492   cm/s. Mean gradient (S): 54 mm Hg. Valve area (VTI): 0.84 cm^2.   Valve area (Vmax): 0.83 cm^2. Valve area (Vmean): 0.82 cm^2. - Aorta: Ascending aortic diameter: 40 mm (S). - Ascending aorta: The ascending aorta was mildly dilated. - Mitral valve: Calcified annulus. There was mild regurgitation.   Valve area by continuity equation (using LVOT flow): 2 cm^2. - Left atrium: The atrium was severely dilated. Volume/bsa, ES   (1-plane Simpson&'s, A4C): 61.5 ml/m^2. - Right ventricle: The cavity size was moderately dilated. Wall   thickness was normal. - Right atrium: The atrium was severely dilated. - Tricuspid valve: There  was mild regurgitation. Impressions: - Aortic stenosis has advanced.   Carotid dopplers 05/27/17 Final Interpretation: Right Carotid: There is evidence in the right ICA of a 1-39% stenosis. Left Carotid: There is evidence in the left ICA of a 1-39% stenosis. Vertebrals: Both vertebral arteries were patent with antegrade flow. Subclavians: Normal flow hemodynamics were seen in bilateral subclavian       arteries.   Laboratory Data:  Chemistry Recent Labs  Lab 07/23/17 2202 07/24/17 0645 07/25/17 0450  NA 137 139 137  K 4.4 4.1 3.9  CL 103 104 101  CO2 21* 24 25  GLUCOSE  171* 103* 146*  BUN 29* 23* 19  CREATININE 1.03 0.86 0.82  CALCIUM 8.6* 8.0* 8.1*  GFRNONAA >60 >60 >60  GFRAA >60 >60 >60  ANIONGAP 13 11 11     Recent Labs  Lab 07/18/17 0846  PROT 6.8  ALBUMIN 4.0  AST 24  ALT 23  ALKPHOS 121*  BILITOT 0.8   Hematology Recent Labs  Lab 07/23/17 2202 07/24/17 0645 07/25/17 0450  WBC 2.6* 1.2* 0.3*  RBC 3.52* 3.08* 3.55*  HGB 9.8* 8.7* 10.1*  HCT 31.5* 27.8* 31.9*  MCV 89.5 90.3 89.9  MCH 27.8 28.2 28.5  MCHC 31.1 31.3 31.7  RDW 17.5* 17.8* 17.7*  PLT 75* 55* 49*   Cardiac Enzymes Recent Labs  Lab 07/24/17 0119 07/24/17 0645  TROPONINI 0.10* 0.10*    Recent Labs  Lab 07/23/17 2215  TROPIPOC 0.09*    BNPNo results for input(s): BNP, PROBNP in the last 168 hours.  DDimer No results for input(s): DDIMER in the last 168 hours.  Radiology/Studies:  Dg Chest 2 View  Result Date: 07/23/2017 CLINICAL DATA:  Chest pain EXAM: CHEST  2 VIEW COMPARISON:  PET-CT 04/15/2017, radiograph 03/06/2017 FINDINGS: Right-sided central venous port tip overlies the SVC. Post sternotomy changes. Cardiomegaly. Mild coarse likely chronic interstitial opacity. No acute consolidation or effusion. Aortic atherosclerosis. No pneumothorax. Surgical clips in the left axilla. IMPRESSION: No active cardiopulmonary disease.  Stable mild cardiomegaly. Electronically Signed   By: Donavan Foil M.D.   On: 07/23/2017 22:22     [facility] Adult Cardiac Surgery Database Version 2.9 RISK SCORES   Procedure: AVR + CAB CALCULATE  Risk of Mortality:  5.875%   Renal Failure:  4.722%   Permanent Stroke:  2.035%   Prolonged Ventilation:  17.885%   DSW Infection:  0.674%   Reoperation:  7.888%   Morbidity or Mortality:  24.931%   Short Length of Stay:  18.208%   Long Length of Stay:  16.757%     Assessment and Plan:   ZACHRY HOPFENSPERGER is a 77 y.o. male with symptoms of severe, stage D1 aortic stenosis with NYHA Class IV symptoms. I have personally reviewd the patient's  recent echocardiogram which is notable for normal LV systolic function and severe aortic stenosis with peak gradient of 97 mmHg and mean transvalvular gradient of 54 mmHg. The patient's dimensionless index is 0.22 and calculated aortic valve area is 0.82 cm.   I have reviewed the natural history of aortic stenosis with the patient. We have discussed the limitations of medical therapy and the poor prognosis associated with symptomatic aortic stenosis. We have reviewed potential treatment options, including palliative medical therapy, conventional surgical aortic valve replacement, and transcatheter aortic valve replacement. We discussed treatment options in the context of this patient's specific comorbid medical conditions.   The patient's predicted risk of mortality with conventional aortic valve replacement is 5.875% primarily based  on his age, previous bypass surgery, CAD, lymphoma with pancytopenia, OSA, HTN, obesity, DMT2. TAVR seems like a reasonable treatment option for this patient pending formal cardiac surgical consultation. We discussed typical evaluation which will require a gated cardiac CTA and a CTA of the chest/abdomen/pelvis to evaluate both his cardiac anatomy and peripheral vasculature. Plan was to do heart cath today but his blood counts are too low (WBC 0.3, Hg 10.1, PLT 49). This has been rescheduled for Monday. I have placed orders for this. I think Mr. Millward will be an excellent TAVR candidate once he recovers from his ongoing chemotherapy and we can safely proceed with TAVR work up. Dr. Angelena Form to follow.     Signed, Angelena Form, PA-C  07/25/2017 8:23 AM   I have personally seen and examined this patient with Angelena Form, PA. I agree with the assessment and plan as outlined above.  Mr. Littles is a 77 yo male with obesity, OSA, HTN, HLD, DM, CAD s/p CABG in 2001, severe aortic stenosis, GERD and B cell lymphoma who we are asked to see regarding his severe aortic  stenosis. He has been followed for AS for several years. He has been stable from a standpoint of his CAD and his AS over the past three years. Last cath in 2015 with all grafts open except the SVG to the Diagonal. He has been felt to be asymptomatic from his AS at previous office visit with Dr. Percival Spanish and our staff. He has been very active, playing golf 7 days per week, since he retired from [employer] 13 years ago. He was diagnosed with diffuse large B cell lymphoma in August 2018. He has been undergoing chemotherapy. He has one more treatment in 2 weeks. Admitted to [facility] with chest pain and dyspnea. Troponin is mildly elevated. Chest pain occurred when walking more than one block. NO chest pain or dyspnea at rest in the hospital. Echo reviewed by me and shows normal LV systolic function with heavily calcified and thickened aortic valve leaflets, restricted leaflet opening consistent with severe AS. Mean gradient is 54 mmHg. DVI is 0.22 and the AVA is around 0.8 cm2.  Labs reviewed by me. He is now pancytopenic following chemotherapy.  EKG shows sinus rhythm.  My exam:   General: Well developed, well nourished, NAD  HEENT: OP clear, mucus membranes moist  SKIN: warm, dry. No rashes. Neuro: No focal deficits  Musculoskeletal: Muscle strength 5/5 all ext  Psychiatric: Mood and affect normal  Neck: No JVD, no carotid bruits, no thyromegaly, no lymphadenopathy.  Lungs:Clear bilaterally, no wheezes, rhonci, crackles Cardiovascular: Regular rate and rhythm. Loud harsh late peaking systolic murmur Abdomen:Soft. Bowel sounds present. Non-tender.  Extremities: No lower extremity edema. Pulses are 2 + in the bilateral DP/PT.  Plan:  1. Severe Aortic valve stenosis: He has severe, stage D aortic valve stenosis. I have personally reviewed the echo images. The aortic valve is thickened, calcified with limited leaflet mobility. I think he would benefit from AVR. Given advanced age, prior open chest  procedure and other comorbidities including lymphoma, he is not a good candidate for conventional AVR by surgical approach. I think he may be a good candidate for TAVR.  I have reviewed the natural history of aortic stenosis with the patient today. We have discussed the limitations of medical therapy and the poor prognosis associated with symptomatic aortic stenosis. We have reviewed potential treatment options, including palliative medical therapy, conventional surgical aortic valve replacement, and transcatheter aortic valve replacement.  We discussed treatment options in the context of the patient's specific comorbid medical conditions.   He would like to proceed with planning for TAVR. We will plan cardiac cath next week if his blood counts are improved. Risks and benefits of procedure reviewed with the patient. After the cath, he will have a cardiac CT, CTA of the chest/abdomen and pelvis to prepare for TAVR. We will most likely need to delay TAVR until his chemotherapy is complete. We will arrange visits with both of the CT surgeons on our TAVR team.   Lauree Chandler 07/25/2017 6:25 PM

## 2017-07-24 NOTE — Progress Notes (Signed)
Pt states he doesn't feel like he can take a deep enough breath, wants CPAP settings adjusted. Pressure increased to 12cmH20, Pt tolerating at this time. Advised pt if he needs anymore adjustments to notify RN. RT will continue to monitor.

## 2017-07-24 NOTE — H&P (Signed)
Cardiology History & Physical    Patient ID: Richard [name] MRN: [id number], DOB: 12-18-1940 Date of Encounter: 07/24/2017, 12:42 AM Primary Physician: Carollee Herter, Alferd Apa, DO  Chief Complaint: CP, DOE   HPI: Richard [name] is a 77 y.o. male with history of severe aortic stenosis, CAD status post 5 vessel CABG (LIMA to LAD, SVG to OM1/OM 2, SVG to distal RCA and known occluded SVG to D1), diffuse large B-cell lymphoma in remission currently undergoing chemotherapy, who presents with worsening dyspnea on exertion and chest tightness.  The patient is noticed over the past several months gradual worsening of his exercise tolerance.  He is now the point where he cannot even walk a block without having significant dyspnea which and chest tightness which limits him.  This typically gets better with a short period of rest.  This afternoon he had significant dyspnea on minimal exertion, accompanied by his usual chest tightness.  However even while at rest this evening, he continued to feel somewhat short of breath and had tightness in his chest with some radiation to the shoulders.  Given these progressive symptoms, he presented to the [facility] ED for further evaluation.  In the ED, his vital signs were within normal limits.  His labs were notable for troponin of 0.09, pancytopenia with a white count of 2.6, and platelets of 75.  His EKG showed Wenckebach with no acute ischemic changes.  He was then admitted to the cardiology service for further management.  In regards to severe aortic stenosis, patient is being considered for TAVR following completion of his chemotherapy regimen.  He has 1 cycle remaining which she will complete in the next 2 weeks.  He was scheduled to see Dr. Percival Spanish shortly to discuss next steps in possible TAVR workup.  On review of systems, he denied presyncope or syncope, neurologic symptoms, palpitations, or bleeding issues.  He had mild lower extremity edema that was reasonably  well controlled with Lasix.  Finally, he had some sinus drainage and congestion recently which was treated with a course of Augmentin.  Past Medical History:  Diagnosis Date  . Adenomatous colon polyp 05/01/2015   tubular  . Anemia   . Antritis (stomach)    mild  . Anxiety   . Aortic stenosis   . Arthritis    "knees, hips, shoulders" (03/15/2014)  . Axillary adenopathy 02/25/2017  . CAD (coronary artery disease)    bypass 2001  . Chronic lower back pain   . Diverticulosis    mild, left colon  . Duodenitis    chronic  . Esophageal stricture    X 4  . GERD (gastroesophageal reflux disease)   . Gout   . Heart murmur    Echo in july 2018  . History of gout   . HNP (herniated nucleus pulposus), lumbar   . HTN (hypertension)   . Iron deficiency   . Lymphoma ([place])   . Migraines    "til age 68 yrs"  . Mixed hyperlipidemia   . OSA on CPAP    with 2L O2 at night  . Peptic stricture of esophagus   . Skin cancer    "right neck cut off; several frozen off my arms" (03/15/2014)  . Sliding hiatal hernia   . Spinal stenosis   . Type II diabetes mellitus ([place])   . Vitamin B12 deficiency   . Wenckebach block    a. brady down to 30s due to frequent block, metoprolol held in  response.     Surgical History:  Past Surgical History:  Procedure Laterality Date  . APPENDECTOMY  ~ 1952  . BACK SURGERY    . CATARACT EXTRACTION W/PHACO Left 11/06/2016   Procedure: CATARACT EXTRACTION PHACO AND INTRAOCULAR LENS PLACEMENT (IOC);  Surgeon: Estill Cotta, MD;  Location: [facility] ORS;  Service: Ophthalmology;  Laterality: Left;  Lot # [id number] H [place]: 01:09.4 AP%:25.2 CDE: 30.64  . CATARACT EXTRACTION W/PHACO Right 12/04/2016   Procedure: CATARACT EXTRACTION PHACO AND INTRAOCULAR LENS PLACEMENT (IOC);  Surgeon: Estill Cotta, MD;  Location: [facility] ORS;  Service: Ophthalmology;  Laterality: Right;  [place] 1:25.9 AP% 24.1 CDE 39.10 Fluid Pack lot # [id number]  . COLONOSCOPY W/ BIOPSIES AND POLYPECTOMY   2013  . CORONARY ANGIOPLASTY  1993  . CORONARY ANGIOPLASTY WITH STENT PLACEMENT  05/1997   "1"  . CORONARY ARTERY BYPASS GRAFT  03/2000   "CABG X5"  . ESOPHAGEAL DILATION  X 3-4   Dr. Lyla Son; "last one was in the 1990's"  . ESOPHAGOGASTRODUODENOSCOPY     multiple  . FLEXIBLE SIGMOIDOSCOPY     multiple  . HYDRADENITIS EXCISION Left 02/25/2017   Procedure: EXCISION DEEP LEFT AXILLARY LYMPH NODE;  Surgeon: Fanny Skates, MD;  Location: [place];  Service: General;  Laterality: Left;  . KNEE ARTHROSCOPY Left 2011   meniscus repair  . LEFT HEART CATHETERIZATION WITH CORONARY/GRAFT ANGIOGRAM N/A 03/16/2014   Procedure: LEFT HEART CATHETERIZATION WITH Beatrix Fetters;  Surgeon: Burnell Blanks, MD;  Location: [facility] CATH LAB;  Service: Cardiovascular;  Laterality: N/A;  . LUMBAR LAMINECTOMY/DECOMPRESSION MICRODISCECTOMY Right 06/17/2013   Procedure: LUMBAR LAMINECTOMY MICRODISCECTOMY L4-L5 RIGHT EXCISION OF SYNOVIAL CYST RIGHT   (1 LEVEL) RIGHT PARTIAL FACETECTOMY;  Surgeon: Tobi Bastos, MD;  Location: [facility] ORS;  Service: Orthopedics;  Laterality: Right;  . MYELOGRAM  04/06/13   lumbar, Dr Gladstone Lighter  . PANENDOSCOPY    . PORTACATH PLACEMENT N/A 03/06/2017   Procedure: INSERTION PORT-A-CATH AND ASPIRATE SEROMA LEFT AXILLA;  Surgeon: Fanny Skates, MD;  Location: [place];  Service: General;  Laterality: N/A;  . SHOULDER SURGERY Right 08/2010   screws placed; "tendons tore off"  . SKIN CANCER EXCISION Right    "neck"  . TONSILLECTOMY  ~ 1954  . UPPER GI ENDOSCOPY  2013   Gastritis; Dr Carlean Purl  . VASECTOMY       Home Meds: Prior to Admission medications   Medication Sig Start Date End Date Taking? Authorizing Provider  allopurinol (ZYLOPRIM) 300 MG tablet Take 450 mg by mouth daily.    Yes [provider]  amLODipine (NORVASC) 5 MG tablet TAKE 2 TABLETS (10 MG TOTAL) BY MOUTH DAILY. Patient taking differently: TAKE 5 MG BY MOUTH TWICE DAILY. 01/15/17  Yes Minus Breeding, MD  amoxicillin-clavulanate (AUGMENTIN) 875-125 MG tablet Take 1 tablet by mouth 2 (two) times daily. 07/18/17  Yes [name], MD  aspirin 81 MG tablet Take 81 mg by mouth daily.   Yes [provider]  atorvastatin (LIPITOR) 80 MG tablet TAKE ONE TABLET BY MOUTH AT BEDTIME 01/29/17  Yes Minus Breeding, MD  diphenoxylate-atropine (LOMOTIL) 2.5-0.025 MG tablet Take 2 pills at the onset of diarrhea, then take one pill after every loose stool. Maximum 8 pills per 24h. 04/04/17  Yes [name], MD  esomeprazole (NEXIUM) 40 MG capsule Take 1 capsule (40 mg total) by mouth daily. 10/15/12  Yes [name], MD  ezetimibe (ZETIA) 10 MG tablet TAKE ONE TABLET BY MOUTH DAILY 01/15/17  Yes Minus Breeding, MD  famciclovir (FAMVIR) 500 MG tablet Take 1 tablet (500 mg total) by mouth daily. 02/27/17  Yes [name], MD  fluticasone (FLONASE) 50 MCG/ACT nasal spray INHALE ONE SPRAY INTO EACH NOSTRIL TWICE DAILY. 05/05/17  Yes Roma Schanz R, DO  furosemide (LASIX) 40 MG tablet Take 3 tablets (120 mg total) 2 (two) times daily by mouth. 05/13/17  Yes Meng, Hao, PA  glimepiride (AMARYL) 2 MG tablet TAKE 1 TABLET (2 MG TOTAL) BY MOUTH DAILY WITH BREAKFAST. 05/16/17  Yes Roma Schanz R, DO  glucose 5 g chewable tablet Chew 15 g by mouth as needed for low blood sugar.   Yes [provider]  lidocaine-prilocaine (EMLA) cream Apply to affected area once Patient taking differently: Apply 1 application topically as needed (to port).  03/04/17  Yes Ennever, Rudell Cobb, MD  lipase/protease/amylase (CREON) 36000 UNITS CPEP capsule Take 2 capsules (72,000 Units total) by mouth 3 (three) times daily with meals. 1 capsule with snack. 05/30/17  Yes [place], Holli Humbles, NP  Menthol, Topical Analgesic, (ICY HOT EX) Apply 1 application topically daily as needed (muscle pain).   Yes [provider]  metFORMIN (GLUCOPHAGE) 1000 MG tablet TAKE ONE AND ONE-HALF TABLETS BY MOUTH  EVERY MORNING AND 1 TALBET IN THE EVENING. 02/24/17  Yes Roma Schanz R, DO  metoprolol tartrate (LOPRESSOR) 25 MG tablet TAKE ONE-HALF TABLET BY MOUTH TWICE DAILY Patient taking differently: TAKE ONE-HALF TABLET BY MOUTH IN THE MORNING AND HALF TABLET IN THE EVENING 01/15/17  Yes Hochrein, Jeneen Rinks, MD  nitroGLYCERIN (NITROSTAT) 0.4 MG SL tablet Place 1 tablet (0.4 mg total) under the tongue every 5 (five) minutes as needed for chest pain. 09/18/16  Yes Minus Breeding, MD  polycarbophil (FIBERCON) 625 MG tablet Take 625 mg by mouth daily.   Yes [provider]  potassium chloride SA (KLOR-CON M20) 20 MEQ tablet Take 2 tablets (40 mEq total) 2 (two) times daily by mouth. 05/13/17  Yes Almyra Deforest, PA  predniSONE (DELTASONE) 20 MG tablet Take 3 pills a day, with food, for 5 days. Start the day after each chemotherapy 02/27/17  Yes Ennever, Rudell Cobb, MD  pregabalin (LYRICA) 75 MG capsule Take 1 capsule (75 mg total) by mouth 3 (three) times daily. 04/03/17  Yes Roma Schanz R, DO  glucose blood (FREESTYLE LITE) test strip TEST BLOOD SUGAR bid 04/03/17   Carollee Herter, Alferd Apa, DO  Lancets (FREESTYLE) lancets Use BID to check blood sugar.  DX E11.9 04/03/17   Roma Schanz R, DO  LORazepam (ATIVAN) 0.5 MG tablet Take 1 tablet (0.5 mg total) by mouth every 6 (six) hours as needed (Nausea or vomiting). Patient not taking: Reported on 06/06/2017 03/04/17   [name], MD  ondansetron (ZOFRAN) 8 MG tablet Take 1 tablet (8 mg total) by mouth 2 (two) times daily as needed for refractory nausea / vomiting. Start on day 3 after cyclophosphamide chemotherapy. Patient not taking: Reported on 06/06/2017 03/04/17   [name], MD  prochlorperazine (COMPAZINE) 10 MG tablet Take 1 tablet (10 mg total) by mouth every 6 (six) hours as needed (Nausea or vomiting). Patient not taking: Reported on 06/06/2017 03/04/17   [name], MD    Allergies:  Allergies  Allergen Reactions  . Benazepril  Swelling    angioedema; he is not a candidate for any angiotensin receptor blockers because of this significant allergic reaction. Because of a history of documented adverse serious  drug reaction;Medi Alert bracelet  is recommended  . Hctz [Hydrochlorothiazide] Anaphylaxis and Swelling    Tongue and lip swelling   . Aspirin Other (See Comments)    Gastritis, cant take 325 Mg aspirin   . Lactose Intolerance (Gi) Nausea And Vomiting    Social History   Socioeconomic History  . Marital status: Divorced    Spouse name: Not on file  . Number of children: 2  . Years of education: Not on file  . Highest education level: Not on file  Social Needs  . Financial resource strain: Not on file  . Food insecurity - worry: Not on file  . Food insecurity - inability: Not on file  . Transportation needs - medical: Not on file  . Transportation needs - non-medical: Not on file  Occupational History    Employer: RETIRED  Tobacco Use  . Smoking status: Never Smoker  . Smokeless tobacco: Never Used  Substance and Sexual Activity  . Alcohol use: No    Alcohol/week: 0.0 oz    Comment: "last drink was in 2012"( 03/15/2014)  . Drug use: No  . Sexual activity: Not Currently  Other Topics Concern  . Not on file  Social History Narrative   Divorced, lives with a roommate.   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     Family History  Problem Relation Age of Onset  . Stroke Father   . Hypertension Father   . Pancreatic cancer Mother   . Diabetes Maternal Grandmother   . Stroke Maternal Grandmother   . Heart attack Paternal Grandmother   . Colon cancer Neg Hx   . Esophageal cancer Neg Hx   . Rectal cancer Neg Hx   . Stomach cancer Neg Hx   . Ulcers Neg Hx     Review of Systems: All other systems reviewed and are  otherwise negative except as noted above.  Labs:   Lab Results  Component Value Date   WBC 2.6 (L) 07/23/2017   HGB 9.8 (L) 07/23/2017   HCT 31.5 (L) 07/23/2017   MCV 89.5 07/23/2017   PLT 75 (L) 07/23/2017    Recent Labs  Lab 07/18/17 0846 07/23/17 2202  NA 145 137  K 3.6 4.4  CL 100 103  CO2 28 21*  BUN 18 29*  CREATININE  --  1.03  CALCIUM 9.9 8.6*  PROT 6.8  --   BILITOT 0.8  --   ALKPHOS 121*  --   ALT 23  --   AST 24  --   GLUCOSE 205* 171*   No results for input(s): CKTOTAL, CKMB, TROPONINI in the last 72 hours. Lab Results  Component Value Date   CHOL 125 11/21/2016   HDL 29.20 (L) 11/21/2016   LDLCALC 57 11/21/2016   TRIG 195.0 (H) 11/21/2016   No results found for: DDIMER  Radiology/Studies:  Dg Chest 2 View  Result Date: 07/23/2017 CLINICAL DATA:  Chest pain EXAM: CHEST  2 VIEW COMPARISON:  PET-CT 04/15/2017, radiograph 03/06/2017 FINDINGS: Right-sided central venous port tip overlies the SVC. Post sternotomy changes. Cardiomegaly. Mild coarse likely chronic interstitial opacity. No acute consolidation or effusion. Aortic atherosclerosis. No pneumothorax. Surgical clips in the left axilla. IMPRESSION: No active cardiopulmonary disease.  Stable mild cardiomegaly. Electronically Signed   By: Madie Reno.D.  On: 07/23/2017 22:22   Wt Readings from Last 3 Encounters:  07/23/17 111.1 kg (245 lb)  07/18/17 114.2 kg (251 lb 12 oz)  06/27/17 116.6 kg (257 lb)    EKG: Normal sinus rhythm, Mobitz type I second-degree AV block.  No acute ischemic changes.  Physical Exam: Blood pressure 130/85, pulse (!) 47, temperature (!) 97.4 F (36.3 C), temperature source Oral, resp. rate 18, height 6' (1.829 m), weight 111.1 kg (245 lb), SpO2 97 %. Body mass index is 33.23 kg/m. General: Well developed, well nourished, in no acute distress. Head: Normocephalic, atraumatic, sclera non-icteric, no xanthomas, nares are without discharge.  Neck: Slow carotid upstrokes.   No JVD. Lungs: Clear bilaterally to auscultation without wheezes, rales, or rhonchi. Breathing is unlabored. Heart: Regularly irregular, late peaking systolic ejection murmur heard throughout the precordium, normal S1, S2 somewhat diminished but still audible.  No rubs or gallops. Abdomen: Soft, non-tender, non-distended with normoactive bowel sounds. No hepatomegaly. No rebound/guarding. No obvious abdominal masses. Msk:  Strength and tone appear normal for age. Extremities: No clubbing or cyanosis.  2+ pitting edema bilaterally.  Distal pedal pulses are 2+ and equal bilaterally. Neuro: Alert and oriented X 3. No focal deficit. No facial asymmetry. Moves all extremities spontaneously. Psych:  Responds to questions appropriately with a normal affect.    Assessment and Plan  77 year old male with a history of prior CABG and severe aortic stenosis, who presents with worsening dyspnea on exertion and chest pain.  His troponin is borderline elevated.  1.  Troponin elevation: More likely a reflection of his underlying severe aortic stenosis then an acute coronary syndrome.  We will plan to cycle cardiac enzymes; as his platelet count is 75 at present, will maintain a slightly higher threshold to heparinize.  Continue home metoprolol, aspirin, high intensity atorvastatin.  2.  Severe aortic stenosis: Clearly with progressive symptoms over the past several months.  Given prior CABG, TAVR would be the ideal option for him.  Expediting his workup with a right/left heart catheterization would be preferred, however this possibility may be limited by thrombocytopenia, and by plans for his final chemo treatment to be administered in the next 1-2 weeks.   He would also require a CT TAVR protocol.  3.  Diffuse large B-cell lymphoma: In remission on most recent PET scan.  Would plan to reach out to outpatient oncologist in the a.m.  4.  Diabetes: Holding home oral agents, will use aspirin insulin as  needed.  5.  Hypertension: Continue home amlodipine and metoprolol.   Signed, Doylene Canning, MD 07/24/2017, 12:42 AM

## 2017-07-24 NOTE — Progress Notes (Signed)
Pt set up on Auto CPAP per order with Max pressure 20cmH20, Min pressure 10cmH20, and nasal mask. Pt tolerating current settings at this time. Pt states he will have someone bring in his home mask for comfort. RT will continue to monitor.

## 2017-07-24 NOTE — Progress Notes (Signed)
Turner stated in regards to pts HR/Rhythm not to contact her unless pt is uncontrolled and she did not want to put pt on anticoagulant at this time. Will continue to monitor pt, and follow physician's orders.

## 2017-07-24 NOTE — Progress Notes (Signed)
Paged cardiology fellow to let them know pts HR goes as low as 38 on the monitor when he sleeps. Will continue to monitor

## 2017-07-24 NOTE — Progress Notes (Signed)
Progress Note  Patient Name: Richard [name] Date of Encounter: 07/24/2017  Primary Cardiologist: Minus Breeding, MD   Subjective   Feels improved since last night. States he attempted to walk a few blocks yesterday but had significant DOE and chest tightness which persisted despite rest prompting him to present to the ED. Has not been out of bed this AM but reports no SOB or chest pain at rest. Encouraged ambulation this AM to assess his symptoms. Denies lightheadedness, dizziness, or syncope.    Inpatient Medications    Scheduled Meds: . allopurinol  450 mg Oral Daily  . amLODipine  10 mg Oral Daily  . [START ON 07/25/2017] aspirin EC  81 mg Oral Daily  . atorvastatin  80 mg Oral QHS  . ezetimibe  10 mg Oral Daily  . furosemide  120 mg Oral BID  . lipase/protease/amylase  72,000 Units Oral TID WC  . pantoprazole  40 mg Oral Daily  . pregabalin  75 mg Oral TID   Continuous Infusions:  PRN Meds: acetaminophen, lipase/protease/amylase, LORazepam, nitroGLYCERIN, ondansetron (ZOFRAN) IV   Vital Signs    Vitals:   07/24/17 0130 07/24/17 0209 07/24/17 0251 07/24/17 0753  BP: (!) 104/49 117/72  122/66  Pulse: (!) 44 (!) 39 86 77  Resp: 17 18 18 20   Temp:  97.6 F (36.4 C)  (!) 97.5 F (36.4 C)  TempSrc:  Oral  Oral  SpO2: 96% 100% 91% 96%  Weight:      Height:        Intake/Output Summary (Last 24 hours) at 07/24/2017 0910 Last data filed at 07/24/2017 5170 Gross per 24 hour  Intake 30 ml  Output 2425 ml  Net -2395 ml   Filed Weights   07/23/17 2203 07/24/17 0030  Weight: 245 lb (111.1 kg) 255 lb 1.6 oz (115.7 kg)    Telemetry    Mobitz II - Personally Reviewed  ECG    Pending this AM - Personally Reviewed  Physical Exam   GEN: Obese elderly gentleman laying in bed in no acute distress.   Neck: No JVD, no carotid bruits Cardiac: regularly irregular, + systolic murmur appreciated throughout precordium, no rubs or gallops.  Respiratory: Clear to  auscultation bilaterally, no wheezes/ rales/ rhonchi GI: NABS, Soft, obese, nontender, non-distended  MS: 1-2+ LE edema; No deformity. Neuro:  Nonfocal, moving all extremities spontaneously Psych: Pleasant, normal affect   Labs    Chemistry Recent Labs  Lab 07/18/17 0846 07/23/17 2202 07/24/17 0645  NA 145 137 139  K 3.6 4.4 4.1  CL 100 103 104  CO2 28 21* 24  GLUCOSE 205* 171* 103*  BUN 18 29* 23*  CREATININE  --  1.03 0.86  CALCIUM 9.9 8.6* 8.0*  PROT 6.8  --   --   ALBUMIN 4.0  --   --   AST 24  --   --   ALT 23  --   --   ALKPHOS 121*  --   --   BILITOT 0.8  --   --   GFRNONAA  --  >60 >60  GFRAA  --  >60 >60  ANIONGAP 17* 13 11     Hematology Recent Labs  Lab 07/18/17 0846 07/23/17 2202 07/24/17 0645  WBC 4.5 2.6* 1.2*  RBC 3.72* 3.52* 3.08*  HGB  --  9.8* 8.7*  HCT 34.1* 31.5* 27.8*  MCV 91.7 89.5 90.3  MCH 29.0 27.8 28.2  MCHC 31.7* 31.1 31.3  RDW 18.0* 17.5*  17.8*  PLT 211 75* 55*    Cardiac Enzymes Recent Labs  Lab 07/24/17 0119 07/24/17 0645  TROPONINI 0.10* 0.10*    Recent Labs  Lab 07/23/17 2215  TROPIPOC 0.09*     BNPNo results for input(s): BNP, PROBNP in the last 168 hours.   DDimer No results for input(s): DDIMER in the last 168 hours.   Radiology    Dg Chest 2 View  Result Date: 07/23/2017 CLINICAL DATA:  Chest pain EXAM: CHEST  2 VIEW COMPARISON:  PET-CT 04/15/2017, radiograph 03/06/2017 FINDINGS: Right-sided central venous port tip overlies the SVC. Post sternotomy changes. Cardiomegaly. Mild coarse likely chronic interstitial opacity. No acute consolidation or effusion. Aortic atherosclerosis. No pneumothorax. Surgical clips in the left axilla. IMPRESSION: No active cardiopulmonary disease.  Stable mild cardiomegaly. Electronically Signed   By: Donavan Foil M.D.   On: 07/23/2017 22:22    Cardiac Studies   ECHO 05/16/17: Study Conclusions  - HPI and indications: Severe Aortic Stenosis (prior Mean Gradient   15mmHg,  01-16-17) - Left ventricle: The cavity size was normal. Systolic function was   normal. The estimated ejection fraction was in the range of 55%   to 60%. Wall motion was normal; there were no regional wall   motion abnormalities. Doppler parameters are consistent with   abnormal left ventricular relaxation (grade 1 diastolic   dysfunction). GLS: -17.2% - Aortic valve: Valve mobility was restricted. There was severe   stenosis. There was trivial regurgitation. Peak velocity (S): 492   cm/s. Mean gradient (S): 54 mm Hg. Valve area (VTI): 0.84 cm^2.   Valve area (Vmax): 0.83 cm^2. Valve area (Vmean): 0.82 cm^2. - Aorta: Ascending aortic diameter: 40 mm (S). - Ascending aorta: The ascending aorta was mildly dilated. - Mitral valve: Calcified annulus. There was mild regurgitation.   Valve area by continuity equation (using LVOT flow): 2 cm^2. - Left atrium: The atrium was severely dilated. Volume/bsa, ES   (1-plane Simpson&'s, A4C): 61.5 ml/m^2. - Right ventricle: The cavity size was moderately dilated. Wall   thickness was normal. - Right atrium: The atrium was severely dilated. - Tricuspid valve: There was mild regurgitation.  Impressions:  - Aortic stenosis has advanced.  Patient Profile     77 y.o. male with history of severe aortic stenosis, CAD status post 5 vessel CABG (LIMA to LAD, SVG to OM1/OM 2, SVG to distal RCA and known occluded SVG to D1), diffuse large B-cell lymphoma in remission currently undergoing chemotherapy, DM, HTN, who presents with worsening dyspnea on exertion and chest tightness.  Assessment & Plan    1. Severe Aortic Stenosis: appears to have progression of symptoms with worsening DOE and exertional chest pain with minimal activity. Denies any syncopal episodes. Was last seen outpatient by cardiology 06/05/17 with complaints of DOE and occasional CP with exertion. Patient was recommended to return in 6 weeks to begin process for TAVR given prior CABG. Patient  has thrombocytopenia 2/2 ongoing chemotherapy for management of diffuse large B-cell lymphoma.  - Can consider CT TAVR protocol in the meantime   2. Chest pain with troponin elevation: History of CAD s/p CABG (LIMA to LAD, SVG to OM1/OM 2, SVG to distal RCA and known occluded SVG to D1); likely a reflection of underlying severe aortic stenosis rather than ACS  - Trop 0.09>0.10>0.10  - Will defer heparin gtt for now given thrombocytopenia - Thrombocytopenia limits further evaluation at this time.  - Continue ASA, statin  3. Pancytopenia: likely 2/2 ongoing chemotherapy. Last  dose 07/18/17 with plans for last session 08/08/17.  - Thrombocytopenia will likely be a limiting factor in further cardiac work-up and/or intervention at this time. Anticipate platelet count will improve with cessation of chemotherapy - PLT 211 on 07/18/17 prior to chemo. Can discuss with oncology expected recovery time, as well as risks/benefits of completing the final session of chemotherapy.   4. Diffuse large B-cell lymphoma: In remission per PET scan 03/2017, now with ongoing chemotherapy (Adriamycin, vincristine, cytoxan, rituxan) s/p 7/8 sessions. Last session completed 07/18/17 with plans for final session 08/08/17.  - Can discuss with oncology expected  recovery time, as well as risks/benefits of completing the final session of chemotherapy.   5. Chronic diastolic heart failure: volume status appears stable. Has chronic LE edema which is unchanged. Lungs are CTAB, no JVD - Continue home lasix 120mg  BID - Metoprolol on hold for bradycardia - Compression stockings   6. 2nd degree AV block, type II: noted on telemetry; HR routinely in the 30s-40s. Patient is asymptomatic - Metoprolol on hold  7. HTN: BP stable  - Continue amlodipine - Metoprolol on hold for bradycardia  8. DM:  - hold home medications - Continue ISS    For questions or updates, please contact [place] Please consult [email or web address] for  contact info under Cardiology/STEMI.      Signed, Abigail Butts, PA-C  07/24/2017, 9:10 AM   [phone]   History and all data above reviewed.  Patient examined.  I agree with the findings as above.  Feels better today but has not walked.  He is having some GI chest burning.  He thinks this is similar to previous GERD  The patient exam reveals COR:RRR, late peaking systolic murmur  ,  Lungs: Clear  ,  Abd: Positive bowel sounds, no rebound no guarding, Ext No edema  .  All available labs, radiology testing, previous records reviewed. Agree with documented assessment and plan. AS:  I spoke with the TAVR team.  I think that he needs a cardiac cath while he is here to look at the coronary arteries.  I would like this to be done with an eye toward eventual TAVR as well.  They will see in consultation and hopefully cath in AM.  Lymphoma:  I spoke with Dr. Marin Olp.  The patient has an excellent prognosis with his lymphoma.  He needs one more treatment in two weeks.  He is at the nadir with his cell counts but should still have adequate platelet function despite the low number.  Risk of cath would not be prohibitive.    Richard [name]  11:20 AM  07/24/2017

## 2017-07-24 NOTE — Progress Notes (Signed)
    I briefly stop by and dropped off TAVR information. I have tentatively placed him on the cath board for [facility] tomorrow with Dr. Angelena Form at 1:30pm. He can eat today. NPO after midnight.    I have reviewed the risks, indications, and alternatives to cardiac catheterization and possible angioplasty/stenting with the patient. Risks include but are not limited to bleeding, infection, vascular injury, stroke, myocardial infection, arrhythmia, kidney injury, radiation-related injury in the case of prolonged fluoroscopy use, emergency cardiac surgery, and death. The patient understands the risks of serious complication is low (<7%).   Will see him tomorrow for full consult.    Angelena Form PA-C  [facility]

## 2017-07-25 ENCOUNTER — Encounter (HOSPITAL_COMMUNITY): Payer: Medicare Other

## 2017-07-25 ENCOUNTER — Other Ambulatory Visit: Payer: Self-pay

## 2017-07-25 ENCOUNTER — Other Ambulatory Visit: Payer: Self-pay | Admitting: Physician Assistant

## 2017-07-25 DIAGNOSIS — J069 Acute upper respiratory infection, unspecified: Secondary | ICD-10-CM | POA: Diagnosis present

## 2017-07-25 DIAGNOSIS — Z952 Presence of prosthetic heart valve: Secondary | ICD-10-CM | POA: Diagnosis not present

## 2017-07-25 DIAGNOSIS — M545 Low back pain: Secondary | ICD-10-CM | POA: Diagnosis present

## 2017-07-25 DIAGNOSIS — D6959 Other secondary thrombocytopenia: Secondary | ICD-10-CM | POA: Diagnosis present

## 2017-07-25 DIAGNOSIS — I248 Other forms of acute ischemic heart disease: Secondary | ICD-10-CM | POA: Diagnosis present

## 2017-07-25 DIAGNOSIS — T451X5A Adverse effect of antineoplastic and immunosuppressive drugs, initial encounter: Secondary | ICD-10-CM | POA: Diagnosis present

## 2017-07-25 DIAGNOSIS — I110 Hypertensive heart disease with heart failure: Secondary | ICD-10-CM | POA: Diagnosis present

## 2017-07-25 DIAGNOSIS — K219 Gastro-esophageal reflux disease without esophagitis: Secondary | ICD-10-CM | POA: Diagnosis present

## 2017-07-25 DIAGNOSIS — E782 Mixed hyperlipidemia: Secondary | ICD-10-CM | POA: Diagnosis present

## 2017-07-25 DIAGNOSIS — R748 Abnormal levels of other serum enzymes: Secondary | ICD-10-CM | POA: Diagnosis not present

## 2017-07-25 DIAGNOSIS — M109 Gout, unspecified: Secondary | ICD-10-CM | POA: Diagnosis present

## 2017-07-25 DIAGNOSIS — I214 Non-ST elevation (NSTEMI) myocardial infarction: Secondary | ICD-10-CM | POA: Diagnosis not present

## 2017-07-25 DIAGNOSIS — E1140 Type 2 diabetes mellitus with diabetic neuropathy, unspecified: Secondary | ICD-10-CM | POA: Diagnosis present

## 2017-07-25 DIAGNOSIS — I5032 Chronic diastolic (congestive) heart failure: Secondary | ICD-10-CM | POA: Diagnosis present

## 2017-07-25 DIAGNOSIS — I350 Nonrheumatic aortic (valve) stenosis: Secondary | ICD-10-CM

## 2017-07-25 DIAGNOSIS — G8929 Other chronic pain: Secondary | ICD-10-CM | POA: Diagnosis present

## 2017-07-25 DIAGNOSIS — R0789 Other chest pain: Secondary | ICD-10-CM | POA: Diagnosis not present

## 2017-07-25 DIAGNOSIS — C8338 Diffuse large B-cell lymphoma, lymph nodes of multiple sites: Secondary | ICD-10-CM | POA: Diagnosis present

## 2017-07-25 DIAGNOSIS — R001 Bradycardia, unspecified: Secondary | ICD-10-CM | POA: Diagnosis present

## 2017-07-25 DIAGNOSIS — E669 Obesity, unspecified: Secondary | ICD-10-CM | POA: Diagnosis present

## 2017-07-25 DIAGNOSIS — I4891 Unspecified atrial fibrillation: Secondary | ICD-10-CM | POA: Diagnosis not present

## 2017-07-25 DIAGNOSIS — I700 Atherosclerosis of aorta: Secondary | ICD-10-CM | POA: Diagnosis not present

## 2017-07-25 DIAGNOSIS — D61810 Antineoplastic chemotherapy induced pancytopenia: Secondary | ICD-10-CM | POA: Diagnosis present

## 2017-07-25 DIAGNOSIS — I25810 Atherosclerosis of coronary artery bypass graft(s) without angina pectoris: Secondary | ICD-10-CM | POA: Diagnosis present

## 2017-07-25 DIAGNOSIS — M48062 Spinal stenosis, lumbar region with neurogenic claudication: Secondary | ICD-10-CM | POA: Diagnosis present

## 2017-07-25 DIAGNOSIS — G4733 Obstructive sleep apnea (adult) (pediatric): Secondary | ICD-10-CM | POA: Diagnosis present

## 2017-07-25 DIAGNOSIS — Z951 Presence of aortocoronary bypass graft: Secondary | ICD-10-CM | POA: Diagnosis not present

## 2017-07-25 DIAGNOSIS — I441 Atrioventricular block, second degree: Secondary | ICD-10-CM | POA: Diagnosis present

## 2017-07-25 DIAGNOSIS — Z6833 Body mass index (BMI) 33.0-33.9, adult: Secondary | ICD-10-CM | POA: Diagnosis not present

## 2017-07-25 LAB — BASIC METABOLIC PANEL
ANION GAP: 11 (ref 5–15)
BUN: 19 mg/dL (ref 6–20)
CALCIUM: 8.1 mg/dL — AB (ref 8.9–10.3)
CO2: 25 mmol/L (ref 22–32)
CREATININE: 0.82 mg/dL (ref 0.61–1.24)
Chloride: 101 mmol/L (ref 101–111)
GLUCOSE: 146 mg/dL — AB (ref 65–99)
Potassium: 3.9 mmol/L (ref 3.5–5.1)
Sodium: 137 mmol/L (ref 135–145)

## 2017-07-25 LAB — PROTIME-INR
INR: 1.13
Prothrombin Time: 14.4 seconds (ref 11.4–15.2)

## 2017-07-25 LAB — CBC WITH DIFFERENTIAL/PLATELET
Basophils Absolute: 0 10*3/uL (ref 0.0–0.1)
Basophils Relative: 3 %
EOS PCT: 10 %
Eosinophils Absolute: 0 10*3/uL (ref 0.0–0.7)
HEMATOCRIT: 31.9 % — AB (ref 39.0–52.0)
Hemoglobin: 10.1 g/dL — ABNORMAL LOW (ref 13.0–17.0)
LYMPHS ABS: 0.3 10*3/uL — AB (ref 0.7–4.0)
Lymphocytes Relative: 73 %
MCH: 28.5 pg (ref 26.0–34.0)
MCHC: 31.7 g/dL (ref 30.0–36.0)
MCV: 89.9 fL (ref 78.0–100.0)
MONO ABS: 0 10*3/uL — AB (ref 0.1–1.0)
MONOS PCT: 10 %
Neutro Abs: 0 10*3/uL — ABNORMAL LOW (ref 1.7–7.7)
Neutrophils Relative %: 4 %
PLATELETS: 49 10*3/uL — AB (ref 150–400)
RBC: 3.55 MIL/uL — AB (ref 4.22–5.81)
RDW: 17.7 % — AB (ref 11.5–15.5)
WBC: 0.3 10*3/uL — AB (ref 4.0–10.5)

## 2017-07-25 LAB — MRSA PCR SCREENING: MRSA BY PCR: NEGATIVE

## 2017-07-25 MED ORDER — SODIUM CHLORIDE 0.9 % IV SOLN: INTRAVENOUS | Status: DC

## 2017-07-25 MED ORDER — SODIUM CHLORIDE 0.9 % IV SOLN
INTRAVENOUS | Status: DC
  Administered 2017-07-25: 05:00:00 via INTRAVENOUS

## 2017-07-25 MED ORDER — ASPIRIN 81 MG PO CHEW
81.0000 mg | CHEWABLE_TABLET | ORAL | Status: AC
  Administered 2017-07-25: 81 mg via ORAL
  Filled 2017-07-25: qty 1

## 2017-07-25 MED ORDER — SODIUM CHLORIDE 0.9% FLUSH: 3.0000 mL | INTRAVENOUS | Status: DC | PRN

## 2017-07-25 MED ORDER — SODIUM CHLORIDE 0.9 % IV SOLN: 250.0000 mL | INTRAVENOUS | Status: DC | PRN

## 2017-07-25 MED ORDER — ASPIRIN 81 MG PO CHEW: 81.0000 mg | CHEWABLE_TABLET | ORAL | Status: DC

## 2017-07-25 MED ORDER — CYCLOBENZAPRINE HCL 10 MG PO TABS
5.0000 mg | ORAL_TABLET | Freq: Three times a day (TID) | ORAL | Status: DC | PRN
  Administered 2017-07-25: 5 mg via ORAL
  Filled 2017-07-25: qty 1

## 2017-07-25 MED ORDER — SODIUM CHLORIDE 0.9% FLUSH
3.0000 mL | Freq: Two times a day (BID) | INTRAVENOUS | Status: DC
  Administered 2017-07-25: 3 mL via INTRAVENOUS

## 2017-07-25 MED ORDER — AMOXICILLIN-POT CLAVULANATE 875-125 MG PO TABS
1.0000 | ORAL_TABLET | Freq: Two times a day (BID) | ORAL | Status: DC
  Administered 2017-07-25 – 2017-07-29 (×9): 1 via ORAL
  Filled 2017-07-25 (×9): qty 1

## 2017-07-25 NOTE — Progress Notes (Signed)
I tried to show Cardiac Cath Video in pts room and the number just continued to ring.  I tried 5 times. Pt states that he already knows all about cardiac cath.

## 2017-07-25 NOTE — Progress Notes (Addendum)
Progress Note  Patient Name: Richard [name] Date of Encounter: 07/25/2017  Primary Cardiologist: Minus Breeding, MD   Subjective   Feels like he has a cold, had been on ABX on admit During the night RN noted a fib, on tele I see ST with 1st degree with HR at 118 and other times SR with mobitz I vs non conducted PACs  Pt has no chest pain nor SOB    Inpatient Medications    Scheduled Meds: . allopurinol  450 mg Oral Daily  . amLODipine  10 mg Oral Daily  . aspirin EC  81 mg Oral Daily  . atorvastatin  80 mg Oral QHS  . ezetimibe  10 mg Oral Daily  . furosemide  120 mg Oral BID  . lipase/protease/amylase  72,000 Units Oral TID WC  . pantoprazole  40 mg Oral Daily  . pregabalin  75 mg Oral TID  . sodium chloride flush  3 mL Intravenous Q12H   Continuous Infusions: . sodium chloride    . sodium chloride 10 mL/hr at 07/25/17 0523   PRN Meds: sodium chloride, acetaminophen, lipase/protease/amylase, LORazepam, magnesium hydroxide, nitroGLYCERIN, ondansetron (ZOFRAN) IV, sodium chloride flush   Vital Signs    Vitals:   07/24/17 1656 07/24/17 1939 07/24/17 2356 07/25/17 0418  BP: 113/66 (!) 111/55 117/80 126/66  Pulse: 70  69 98  Resp: 18 18 19 18   Temp: 97.6 F (36.4 C) 97.8 F (36.6 C) 97.9 F (36.6 C) 97.8 F (36.6 C)  TempSrc: Oral Oral Oral Oral  SpO2: 92% 96% 94% 95%  Weight:    245 lb 4.8 oz (111.3 kg)  Height:        Intake/Output Summary (Last 24 hours) at 07/25/2017 5009 Last data filed at 07/25/2017 0422 Gross per 24 hour  Intake 750 ml  Output 1900 ml  Net -1150 ml   Filed Weights   07/23/17 2203 07/24/17 0030 07/25/17 0418  Weight: 245 lb (111.1 kg) 255 lb 1.6 oz (115.7 kg) 245 lb 4.8 oz (111.3 kg)    Telemetry    No a fib ST with 1st degree AV block and Mobitz I - Personally Reviewed  ECG    SR with mobitz 1 - Personally Reviewed  Physical Exam   GEN: No acute distress.   Neck: No JVD Cardiac: RRR, 3/6 systolic aortic murmur, no rubs,  or gallops.  Respiratory: Clear to auscultation bilaterally. GI: Soft, nontender, non-distended  MS: mild edema; No deformity. Neuro:  Nonfocal  Psych: Normal affect   Labs    Chemistry Recent Labs  Lab 07/18/17 0846 07/23/17 2202 07/24/17 0645 07/25/17 0450  NA 145 137 139 137  K 3.6 4.4 4.1 3.9  CL 100 103 104 101  CO2 28 21* 24 25  GLUCOSE 205* 171* 103* 146*  BUN 18 29* 23* 19  CREATININE  --  1.03 0.86 0.82  CALCIUM 9.9 8.6* 8.0* 8.1*  PROT 6.8  --   --   --   ALBUMIN 4.0  --   --   --   AST 24  --   --   --   ALT 23  --   --   --   ALKPHOS 121*  --   --   --   BILITOT 0.8  --   --   --   GFRNONAA  --  >60 >60 >60  GFRAA  --  >60 >60 >60  ANIONGAP 17* 13 11 11      Hematology  Recent Labs  Lab 07/23/17 2202 07/24/17 0645 07/25/17 0450  WBC 2.6* 1.2* 0.3*  RBC 3.52* 3.08* 3.55*  HGB 9.8* 8.7* 10.1*  HCT 31.5* 27.8* 31.9*  MCV 89.5 90.3 89.9  MCH 27.8 28.2 28.5  MCHC 31.1 31.3 31.7  RDW 17.5* 17.8* 17.7*  PLT 75* 55* 49*    Cardiac Enzymes Recent Labs  Lab 07/24/17 0119 07/24/17 0645  TROPONINI 0.10* 0.10*    Recent Labs  Lab 07/23/17 2215  TROPIPOC 0.09*     BNPNo results for input(s): BNP, PROBNP in the last 168 hours.   DDimer No results for input(s): DDIMER in the last 168 hours.   Radiology    Dg Chest 2 View  Result Date: 07/23/2017 CLINICAL DATA:  Chest pain EXAM: CHEST  2 VIEW COMPARISON:  PET-CT 04/15/2017, radiograph 03/06/2017 FINDINGS: Right-sided central venous port tip overlies the SVC. Post sternotomy changes. Cardiomegaly. Mild coarse likely chronic interstitial opacity. No acute consolidation or effusion. Aortic atherosclerosis. No pneumothorax. Surgical clips in the left axilla. IMPRESSION: No active cardiopulmonary disease.  Stable mild cardiomegaly. Electronically Signed   By: Donavan Foil M.D.   On: 07/23/2017 22:22    Cardiac Studies   ECHO 05/16/17: Study Conclusions  - HPI and indications: Severe Aortic  Stenosis (prior Mean Gradient 53mmHg, 01-16-17) - Left ventricle: The cavity size was normal. Systolic function was normal. The estimated ejection fraction was in the range of 55% to 60%. Wall motion was normal; there were no regional wall motion abnormalities. Doppler parameters are consistent with abnormal left ventricular relaxation (grade 1 diastolic dysfunction). GLS: -17.2% - Aortic valve: Valve mobility was restricted. There was severe stenosis. There was trivial regurgitation. Peak velocity (S): 492 cm/s. Mean gradient (S): 54 mm Hg. Valve area (VTI): 0.84 cm^2. Valve area (Vmax): 0.83 cm^2. Valve area (Vmean): 0.82 cm^2. - Aorta: Ascending aortic diameter: 40 mm (S). - Ascending aorta: The ascending aorta was mildly dilated. - Mitral valve: Calcified annulus. There was mild regurgitation. Valve area by continuity equation (using LVOT flow): 2 cm^2. - Left atrium: The atrium was severely dilated. Volume/bsa, ES (1-plane Simpson&'s, A4C): 61.5 ml/m^2. - Right ventricle: The cavity size was moderately dilated. Wall thickness was normal. - Right atrium: The atrium was severely dilated. - Tricuspid valve: There was mild regurgitation.  Impressions:  - Aortic stenosis has advanced.    Patient Profile     77 y.o. male with history ofsevere aortic stenosis, CAD status post 5 vessel CABG (LIMA to LAD, SVG to OM1/OM 2, SVG to distal RCA and known occluded SVG to D1), diffuse large B-cell lymphoma in remission currently undergoing chemotherapy, DM, HTN, who presented with worsening dyspnea on exertion and chest tightness.  Assessment & Plan   Severe aortic stenosis - was to have cath today R & L but plts now 49K and  Pt was planned for TAVR work up before admit  Angina with elevated tropoin, heparin held due to thrombocytopenia-- may be from AS alone  Pancytopenia  plts 49K, WBC 0.3 ? neutropenic precautions.--due to chemo for B-cell lymphoma Dr.  Marin Olp is oncologist    ST with 1st degree AV block and Mobitz I--I do not see a fib.  HR to 118 at times, metoprolol on hold   CAD with s/p CABG (LIMA to LAD, SVG to OM1/OM 2, SVG to distal RCA and known occluded SVG to D1); likely a reflection of underlying severe aortic stenosis rather than ACS   DM -2 -elevated at times 205 to  103 may need coverage with SSI  Diffuse large B cell lymphoma per onc  Chronic diastolic HF chronic lower ext edema   HTN controlled on amlodipine   For questions or updates, please contact [place] Please consult [email or web address] for contact info under Cardiology/STEMI.   History and all data above reviewed.  Patient examined.  I agree with the findings as above.  He reports some URI symptoms with head congestion.  He has not had any further chest pain.  No acute SOB.  The patient exam reveals COR:RRR with ectopy  ,  Lungs: clear  ,  Abd: Positive bowel sounds, no rebound no guarding, Ext no edema  .  All available labs, radiology testing, previous records reviewed. Agree with documented assessment and plan.  Leukocytopenia:  Worse.  I do have a phone call in to Dr. Marin Olp to see if there are other treatments precautions we should take.  Cath is no hold.  I will restart Augmentin as he was 5 days through a 10 day course for a URI when he came in and this was not completed.  AS:  For now any further elective TAVR eval is on hold.  Unstable Angina:  Medical management.  Therapy is limited by low HR at times.  However, might be able to resume beta blocker if he is more tachy than brady over the next 24 hours.     Jeneen Rinks Stefani Baik  12:38 PM  07/25/2017    Signed, Cecilie Kicks, NP  07/25/2017, 7:07 AM

## 2017-07-25 NOTE — Progress Notes (Signed)
Pt having severe cramping on both thighs. He had an episode on the left thigh 30 min ago. K. Thompson PA-C called and updated with order for muscle relaxant.[name]

## 2017-07-26 LAB — CBC
HEMATOCRIT: 30.1 % — AB (ref 39.0–52.0)
Hemoglobin: 9.6 g/dL — ABNORMAL LOW (ref 13.0–17.0)
MCH: 28.1 pg (ref 26.0–34.0)
MCHC: 31.9 g/dL (ref 30.0–36.0)
MCV: 88 fL (ref 78.0–100.0)
Platelets: 36 10*3/uL — ABNORMAL LOW (ref 150–400)
RBC: 3.42 MIL/uL — AB (ref 4.22–5.81)
RDW: 17.2 % — ABNORMAL HIGH (ref 11.5–15.5)
WBC: 0.4 10*3/uL — AB (ref 4.0–10.5)

## 2017-07-26 LAB — BASIC METABOLIC PANEL
ANION GAP: 11 (ref 5–15)
BUN: 14 mg/dL (ref 6–20)
CO2: 21 mmol/L — AB (ref 22–32)
Calcium: 7.7 mg/dL — ABNORMAL LOW (ref 8.9–10.3)
Chloride: 101 mmol/L (ref 101–111)
Creatinine, Ser: 0.97 mg/dL (ref 0.61–1.24)
GLUCOSE: 200 mg/dL — AB (ref 65–99)
POTASSIUM: 3.3 mmol/L — AB (ref 3.5–5.1)
Sodium: 133 mmol/L — ABNORMAL LOW (ref 135–145)

## 2017-07-26 MED ORDER — MENTHOL 3 MG MT LOZG
1.0000 | LOZENGE | OROMUCOSAL | Status: DC | PRN
  Administered 2017-07-26 (×2): 3 mg via ORAL
  Filled 2017-07-26 (×2): qty 9

## 2017-07-26 NOTE — Progress Notes (Signed)
Progress Note  Patient Name: Richard [name] UUVOZD Date of Encounter: 07/26/2017  Primary Cardiologist: Minus Breeding, MD   Subjective   Sore throat; no chest pain or dyspnea  Inpatient Medications    Scheduled Meds: . allopurinol  450 mg Oral Daily  . amLODipine  10 mg Oral Daily  . amoxicillin-clavulanate  1 tablet Oral BID  . aspirin EC  81 mg Oral Daily  . atorvastatin  80 mg Oral QHS  . ezetimibe  10 mg Oral Daily  . furosemide  120 mg Oral BID  . lipase/protease/amylase  72,000 Units Oral TID WC  . pantoprazole  40 mg Oral Daily  . pregabalin  75 mg Oral TID  . sodium chloride flush  3 mL Intravenous Q12H   Continuous Infusions: . sodium chloride    . sodium chloride 10 mL/hr at 07/25/17 0523   PRN Meds: sodium chloride, acetaminophen, cyclobenzaprine, lipase/protease/amylase, LORazepam, magnesium hydroxide, menthol-cetylpyridinium, nitroGLYCERIN, ondansetron (ZOFRAN) IV, sodium chloride flush   Vital Signs    Vitals:   07/26/17 0025 07/26/17 0551 07/26/17 0558 07/26/17 0757  BP: 121/79 115/75  (!) 110/55  Pulse: 84 (!) 107    Resp: 18 20    Temp: 98.7 F (37.1 C) 98.1 F (36.7 C)  98.2 F (36.8 C)  TempSrc: Axillary Oral  Oral  SpO2: 97% 97%  95%  Weight:   244 lb 6.4 oz (110.9 kg)   Height:        Intake/Output Summary (Last 24 hours) at 07/26/2017 0946 Last data filed at 07/26/2017 0700 Gross per 24 hour  Intake 360 ml  Output 1925 ml  Net -1565 ml   Filed Weights   07/24/17 0030 07/25/17 0418 07/26/17 0558  Weight: 255 lb 1.6 oz (115.7 kg) 245 lb 4.8 oz (111.3 kg) 244 lb 6.4 oz (110.9 kg)    Telemetry    Sinus with Mobitz 1 vs nonconducted PACs - Personally Reviewed   Physical Exam   GEN: No acute distress.   Neck: No JVD Cardiac: RRR, 3/6 systolic murmur  Respiratory: Clear to auscultation bilaterally. GI: Soft, nontender, non-distended  MS: trace edema Neuro:  Nonfocal  Psych: Normal affect   Labs    Chemistry Recent Labs  Lab  07/23/17 2202 07/24/17 0645 07/25/17 0450  NA 137 139 137  K 4.4 4.1 3.9  CL 103 104 101  CO2 21* 24 25  GLUCOSE 171* 103* 146*  BUN 29* 23* 19  CREATININE 1.03 0.86 0.82  CALCIUM 8.6* 8.0* 8.1*  GFRNONAA >60 >60 >60  GFRAA >60 >60 >60  ANIONGAP 13 11 11      Hematology Recent Labs  Lab 07/23/17 2202 07/24/17 0645 07/25/17 0450  WBC 2.6* 1.2* 0.3*  RBC 3.52* 3.08* 3.55*  HGB 9.8* 8.7* 10.1*  HCT 31.5* 27.8* 31.9*  MCV 89.5 90.3 89.9  MCH 27.8 28.2 28.5  MCHC 31.1 31.3 31.7  RDW 17.5* 17.8* 17.7*  PLT 75* 55* 49*    Cardiac Enzymes Recent Labs  Lab 07/24/17 0119 07/24/17 0645  TROPONINI 0.10* 0.10*    Recent Labs  Lab 07/23/17 2215  TROPIPOC 0.09*    Patient Profile     77 y.o. male with past medical history of coronary artery disease status post coronary artery bypass and graft admitted with worsening dyspnea on exertion and chest pain.  He has severe aortic stenosis and TAVR wu in progress.  Also with B-cell lymphoma currently undergoing chemotherapy with pancytopenia.  Assessment & Plan    1  Severe aortic stenosis-patient is being evaluated for TAVR.  Plan is for right and left cardiac catheterization next week if platelet count improves.  2 minimally elevated troponin-no clear trend and not consistent with acute coronary syndrome.  For cardiac catheterization when platelet count improves.  3 diffuse large B-cell lymphoma-patient presently receiving chemotherapy.  Last received therapy last week.  4 pancytopenia-felt secondary to chemotherapy.  Recheck CBC today and tomorrow morning.  5 coronary artery disease status post prior coronary artery bypass and graft-plan to continue aspirin and statin.  If platelet count falls further will discontinue aspirin.  6 Mobitz 1 versus nonconducted PACs-metoprolol on hold.  7 chronic diastolic congestive heart failure-continue present dose of Lasix.  Follow renal function.  8 URI-continue antibiotics.    For  questions or updates, please contact [place] Please consult [email or web address] for contact info under Cardiology/STEMI.      Signed, Kirk Ruths, MD  07/26/2017, 9:46 AM

## 2017-07-26 NOTE — Progress Notes (Signed)
Patient complaining of a sore throat that started yesterday and feels worse today.  RN text paged Cardiology with this information and requested an order for Cepacol throat spray or lozenges.

## 2017-07-27 LAB — CBC
HCT: 28.9 % — ABNORMAL LOW (ref 39.0–52.0)
Hemoglobin: 9.3 g/dL — ABNORMAL LOW (ref 13.0–17.0)
MCH: 28.1 pg (ref 26.0–34.0)
MCHC: 32.2 g/dL (ref 30.0–36.0)
MCV: 87.3 fL (ref 78.0–100.0)
Platelets: 40 10*3/uL — ABNORMAL LOW (ref 150–400)
RBC: 3.31 MIL/uL — ABNORMAL LOW (ref 4.22–5.81)
RDW: 17.4 % — ABNORMAL HIGH (ref 11.5–15.5)
WBC: 0.5 10*3/uL — CL (ref 4.0–10.5)

## 2017-07-27 LAB — BASIC METABOLIC PANEL
ANION GAP: 9 (ref 5–15)
BUN: 16 mg/dL (ref 6–20)
CALCIUM: 8 mg/dL — AB (ref 8.9–10.3)
CO2: 23 mmol/L (ref 22–32)
CREATININE: 0.97 mg/dL (ref 0.61–1.24)
Chloride: 102 mmol/L (ref 101–111)
GFR calc Af Amer: >60 mL/min (ref >60)
GLUCOSE: 160 mg/dL — AB (ref 65–99)
Potassium: 3.4 mmol/L — ABNORMAL LOW (ref 3.5–5.1)
Sodium: 134 mmol/L — ABNORMAL LOW (ref 135–145)

## 2017-07-27 LAB — GLUCOSE, CAPILLARY: Glucose-Capillary: 162 mg/dL — ABNORMAL HIGH (ref 65–99)

## 2017-07-27 MED ORDER — POTASSIUM CHLORIDE CRYS ER 20 MEQ PO TBCR
40.0000 meq | EXTENDED_RELEASE_TABLET | Freq: Once | ORAL | Status: AC
  Administered 2017-07-27: 40 meq via ORAL
  Filled 2017-07-27: qty 2

## 2017-07-27 MED ORDER — INSULIN ASPART 100 UNIT/ML ~~LOC~~ SOLN
0.0000 [IU] | Freq: Three times a day (TID) | SUBCUTANEOUS | Status: DC
Start: 2017-07-28 — End: 2017-07-29
  Administered 2017-07-29: 2 [IU] via SUBCUTANEOUS

## 2017-07-27 MED ORDER — METOPROLOL TARTRATE 12.5 MG HALF TABLET
12.5000 mg | ORAL_TABLET | Freq: Two times a day (BID) | ORAL | Status: DC
  Administered 2017-07-27 – 2017-07-29 (×4): 12.5 mg via ORAL
  Filled 2017-07-27 (×5): qty 1

## 2017-07-27 NOTE — Progress Notes (Signed)
Patient is able to place himself on CPAP when he is ready. Patient needs no assistance.

## 2017-07-27 NOTE — Progress Notes (Signed)
Progress Note  Patient Name: Richard [name] CHENID Date of Encounter: 07/27/2017  Primary Cardiologist: Minus Breeding, MD   Subjective   Complains of cold symptoms but no productive cough; no dyspnea or chest pain  Inpatient Medications    Scheduled Meds: . allopurinol  450 mg Oral Daily  . amLODipine  10 mg Oral Daily  . amoxicillin-clavulanate  1 tablet Oral BID  . aspirin EC  81 mg Oral Daily  . atorvastatin  80 mg Oral QHS  . ezetimibe  10 mg Oral Daily  . furosemide  120 mg Oral BID  . lipase/protease/amylase  72,000 Units Oral TID WC  . pantoprazole  40 mg Oral Daily  . pregabalin  75 mg Oral TID  . sodium chloride flush  3 mL Intravenous Q12H   Continuous Infusions: . sodium chloride    . sodium chloride 10 mL/hr at 07/25/17 0523   PRN Meds: sodium chloride, acetaminophen, cyclobenzaprine, lipase/protease/amylase, LORazepam, magnesium hydroxide, menthol-cetylpyridinium, nitroGLYCERIN, ondansetron (ZOFRAN) IV, sodium chloride flush   Vital Signs    Vitals:   07/26/17 2135 07/27/17 0023 07/27/17 0548 07/27/17 0822  BP:  120/61 90/71 (!) 150/78  Pulse: 78 71 63 (!) 110  Resp: 18 20 20    Temp:  98.9 F (37.2 C) 97.8 F (36.6 C) (!) 97.5 F (36.4 C)  TempSrc:  Oral Oral Oral  SpO2: 96% 96% 96% 98%  Weight:   239 lb 8 oz (108.6 kg)   Height:        Intake/Output Summary (Last 24 hours) at 07/27/2017 0828 Last data filed at 07/27/2017 0600 Gross per 24 hour  Intake -  Output 2050 ml  Net -2050 ml   Filed Weights   07/25/17 0418 07/26/17 0558 07/27/17 0548  Weight: 245 lb 4.8 oz (111.3 kg) 244 lb 6.4 oz (110.9 kg) 239 lb 8 oz (108.6 kg)    Telemetry    Sinus with frequent PACs (some nonconducted) - Personally Reviewed   Physical Exam   GEN: WD, WN No acute distress.   Neck: supple Cardiac: RRR, 3/6 systolic murmur, no DM Respiratory: CTA GI: Soft, nontender, non-distended, no masses  MS: no edema Neuro:  Grossly intact  Labs     Chemistry Recent Labs  Lab 07/25/17 0450 07/26/17 1323 07/27/17 0519  NA 137 133* 134*  K 3.9 3.3* 3.4*  CL 101 101 102  CO2 25 21* 23  GLUCOSE 146* 200* 160*  BUN 19 14 16   CREATININE 0.82 0.97 0.97  CALCIUM 8.1* 7.7* 8.0*  GFRNONAA >60 >60 >60  GFRAA >60 >60 >60  ANIONGAP 11 11 9      Hematology Recent Labs  Lab 07/25/17 0450 07/26/17 1323 07/27/17 0519  WBC 0.3* 0.4* 0.5*  RBC 3.55* 3.42* 3.31*  HGB 10.1* 9.6* 9.3*  HCT 31.9* 30.1* 28.9*  MCV 89.9 88.0 87.3  MCH 28.5 28.1 28.1  MCHC 31.7 31.9 32.2  RDW 17.7* 17.2* 17.4*  PLT 49* 36* 40*    Cardiac Enzymes Recent Labs  Lab 07/24/17 0119 07/24/17 0645  TROPONINI 0.10* 0.10*    Recent Labs  Lab 07/23/17 2215  TROPIPOC 0.09*    Patient Profile     77 y.o. male with past medical history of coronary artery disease status post coronary artery bypass and graft admitted with worsening dyspnea on exertion and chest pain.  He has severe aortic stenosis and TAVR wu in progress.  Also with B-cell lymphoma currently undergoing chemotherapy with pancytopenia.  Assessment & Plan  1 Severe aortic stenosis-TAVR under consideration; for R and L cath as plt count improves  2 minimally elevated troponin-no clear trend and not consistent with acute coronary syndrome.  For cath pre TAVR as plt count improves.  3 diffuse large B-cell lymphoma-patient presently receiving chemotherapy.  Last received therapy last week.  4 pancytopenia-felt secondary to chemotherapy.  Follow plt count  5 coronary artery disease status post prior coronary artery bypass and graft-plan to continue aspirin and statin.  If platelet count falls further will discontinue aspirin.  6 Nonconducted PACs-resume low dose lopressor and follow.  7 chronic diastolic congestive heart failure-continue present dose of Lasix.  Follow renal function.  8 URI-no productive cough; continue antibiotics.    For questions or updates, please contact [place] Please consult [email or web address] for contact info under Cardiology/STEMI.      Signed, Kirk Ruths, MD  07/27/2017, 8:28 AM

## 2017-07-27 NOTE — H&P (View-Only) (Signed)
Progress Note  Patient Name: Richard [name] ERXVQM Date of Encounter: 07/27/2017  Primary Cardiologist: Minus Breeding, MD   Subjective   Complains of cold symptoms but no productive cough; no dyspnea or chest pain  Inpatient Medications    Scheduled Meds: . allopurinol  450 mg Oral Daily  . amLODipine  10 mg Oral Daily  . amoxicillin-clavulanate  1 tablet Oral BID  . aspirin EC  81 mg Oral Daily  . atorvastatin  80 mg Oral QHS  . ezetimibe  10 mg Oral Daily  . furosemide  120 mg Oral BID  . lipase/protease/amylase  72,000 Units Oral TID WC  . pantoprazole  40 mg Oral Daily  . pregabalin  75 mg Oral TID  . sodium chloride flush  3 mL Intravenous Q12H   Continuous Infusions: . sodium chloride    . sodium chloride 10 mL/hr at 07/25/17 0523   PRN Meds: sodium chloride, acetaminophen, cyclobenzaprine, lipase/protease/amylase, LORazepam, magnesium hydroxide, menthol-cetylpyridinium, nitroGLYCERIN, ondansetron (ZOFRAN) IV, sodium chloride flush   Vital Signs    Vitals:   07/26/17 2135 07/27/17 0023 07/27/17 0548 07/27/17 0822  BP:  120/61 90/71 (!) 150/78  Pulse: 78 71 63 (!) 110  Resp: 18 20 20    Temp:  98.9 F (37.2 C) 97.8 F (36.6 C) (!) 97.5 F (36.4 C)  TempSrc:  Oral Oral Oral  SpO2: 96% 96% 96% 98%  Weight:   239 lb 8 oz (108.6 kg)   Height:        Intake/Output Summary (Last 24 hours) at 07/27/2017 0828 Last data filed at 07/27/2017 0600 Gross per 24 hour  Intake -  Output 2050 ml  Net -2050 ml   Filed Weights   07/25/17 0418 07/26/17 0558 07/27/17 0548  Weight: 245 lb 4.8 oz (111.3 kg) 244 lb 6.4 oz (110.9 kg) 239 lb 8 oz (108.6 kg)    Telemetry    Sinus with frequent PACs (some nonconducted) - Personally Reviewed   Physical Exam   GEN: WD, WN No acute distress.   Neck: supple Cardiac: RRR, 3/6 systolic murmur, no DM Respiratory: CTA GI: Soft, nontender, non-distended, no masses  MS: no edema Neuro:  Grossly intact  Labs     Chemistry Recent Labs  Lab 07/25/17 0450 07/26/17 1323 07/27/17 0519  NA 137 133* 134*  K 3.9 3.3* 3.4*  CL 101 101 102  CO2 25 21* 23  GLUCOSE 146* 200* 160*  BUN 19 14 16   CREATININE 0.82 0.97 0.97  CALCIUM 8.1* 7.7* 8.0*  GFRNONAA >60 >60 >60  GFRAA >60 >60 >60  ANIONGAP 11 11 9      Hematology Recent Labs  Lab 07/25/17 0450 07/26/17 1323 07/27/17 0519  WBC 0.3* 0.4* 0.5*  RBC 3.55* 3.42* 3.31*  HGB 10.1* 9.6* 9.3*  HCT 31.9* 30.1* 28.9*  MCV 89.9 88.0 87.3  MCH 28.5 28.1 28.1  MCHC 31.7 31.9 32.2  RDW 17.7* 17.2* 17.4*  PLT 49* 36* 40*    Cardiac Enzymes Recent Labs  Lab 07/24/17 0119 07/24/17 0645  TROPONINI 0.10* 0.10*    Recent Labs  Lab 07/23/17 2215  TROPIPOC 0.09*    Patient Profile     77 y.o. male with past medical history of coronary artery disease status post coronary artery bypass and graft admitted with worsening dyspnea on exertion and chest pain.  He has severe aortic stenosis and TAVR wu in progress.  Also with B-cell lymphoma currently undergoing chemotherapy with pancytopenia.  Assessment & Plan  1 Severe aortic stenosis-TAVR under consideration; for R and L cath as plt count improves  2 minimally elevated troponin-no clear trend and not consistent with acute coronary syndrome.  For cath pre TAVR as plt count improves.  3 diffuse large B-cell lymphoma-patient presently receiving chemotherapy.  Last received therapy last week.  4 pancytopenia-felt secondary to chemotherapy.  Follow plt count  5 coronary artery disease status post prior coronary artery bypass and graft-plan to continue aspirin and statin.  If platelet count falls further will discontinue aspirin.  6 Nonconducted PACs-resume low dose lopressor and follow.  7 chronic diastolic congestive heart failure-continue present dose of Lasix.  Follow renal function.  8 URI-no productive cough; continue antibiotics.    For questions or updates, please contact [place] Please consult [email or web address] for contact info under Cardiology/STEMI.      Signed, Kirk Ruths, MD  07/27/2017, 8:28 AM

## 2017-07-27 NOTE — Progress Notes (Signed)
Patient stated he has a cold, with clear nasal exudate, runny nose and since Friday 07/25/17 unable to use CPAP at night because it causes his nose to become "more runny" and he is unable to breathe with CPAP on. WBC count is 0.5, Platelet count 40.

## 2017-07-27 NOTE — Progress Notes (Signed)
Patient concerned that he has not been getting his diabetic medications. Nurse paged provider and order received for Sliding scale insulin and blood sugar checks. Patient satisfied.

## 2017-07-28 ENCOUNTER — Encounter (HOSPITAL_COMMUNITY): Payer: Self-pay | Admitting: Cardiovascular Disease

## 2017-07-28 ENCOUNTER — Ambulatory Visit (HOSPITAL_COMMUNITY)
Admission: EM | Disposition: A | Discharge status: Home / Self Care | Payer: Self-pay | Source: Home / Self Care | Attending: Cardiovascular Disease

## 2017-07-28 DIAGNOSIS — I214 Non-ST elevation (NSTEMI) myocardial infarction: Secondary | ICD-10-CM

## 2017-07-28 DIAGNOSIS — I350 Nonrheumatic aortic (valve) stenosis: Secondary | ICD-10-CM

## 2017-07-28 HISTORY — PX: RIGHT/LEFT HEART CATH AND CORONARY/GRAFT ANGIOGRAPHY: CATH118267

## 2017-07-28 LAB — POCT I-STAT 3, ART BLOOD GAS (G3+)
Acid-base deficit: 4 mmol/L — ABNORMAL HIGH (ref 0.0–2.0)
BICARBONATE: 20.5 mmol/L (ref 20.0–28.0)
O2 SAT: 89 %
PO2 ART: 56 mmHg — AB (ref 83.0–108.0)
TCO2: 21 mmol/L — ABNORMAL LOW (ref 22–32)
pCO2 arterial: 32.9 mmHg (ref 32.0–48.0)
pH, Arterial: 7.402 (ref 7.350–7.450)

## 2017-07-28 LAB — POCT I-STAT 3, VENOUS BLOOD GAS (G3P V)
Acid-base deficit: 3 mmol/L — ABNORMAL HIGH (ref 0.0–2.0)
BICARBONATE: 21.9 mmol/L (ref 20.0–28.0)
O2 Saturation: 48 %
PCO2 VEN: 37.3 mmHg — AB (ref 44.0–60.0)
TCO2: 23 mmol/L (ref 22–32)
pH, Ven: 7.377 (ref 7.250–7.430)
pO2, Ven: 26 mmHg — CL (ref 32.0–45.0)

## 2017-07-28 LAB — POCT ACTIVATED CLOTTING TIME: Activated Clotting Time: 125 seconds

## 2017-07-28 LAB — GLUCOSE, CAPILLARY
Glucose-Capillary: 153 mg/dL — ABNORMAL HIGH (ref 65–99)
Glucose-Capillary: 147 mg/dL — ABNORMAL HIGH (ref 65–99)
Glucose-Capillary: 166 mg/dL — ABNORMAL HIGH (ref 65–99)
Glucose-Capillary: 137 mg/dL — ABNORMAL HIGH (ref 65–99)

## 2017-07-28 LAB — CBC
HEMATOCRIT: 30.4 % — AB (ref 39.0–52.0)
Hemoglobin: 9.8 g/dL — ABNORMAL LOW (ref 13.0–17.0)
MCH: 28.5 pg (ref 26.0–34.0)
MCHC: 32.2 g/dL (ref 30.0–36.0)
MCV: 88.4 fL (ref 78.0–100.0)
PLATELETS: 56 10*3/uL — AB (ref 150–400)
RBC: 3.44 MIL/uL — AB (ref 4.22–5.81)
RDW: 17.8 % — AB (ref 11.5–15.5)
WBC: 1.2 10*3/uL — AB (ref 4.0–10.5)

## 2017-07-28 LAB — BASIC METABOLIC PANEL
ANION GAP: 12 (ref 5–15)
BUN: 13 mg/dL (ref 6–20)
CO2: 22 mmol/L (ref 22–32)
Calcium: 7.9 mg/dL — ABNORMAL LOW (ref 8.9–10.3)
Chloride: 101 mmol/L (ref 101–111)
Creatinine, Ser: 0.94 mg/dL (ref 0.61–1.24)
Glucose, Bld: 171 mg/dL — ABNORMAL HIGH (ref 65–99)
POTASSIUM: 3.4 mmol/L — AB (ref 3.5–5.1)
Sodium: 135 mmol/L (ref 135–145)

## 2017-07-28 SURGERY — RIGHT/LEFT HEART CATH AND CORONARY/GRAFT ANGIOGRAPHY
Anesthesia: LOCAL

## 2017-07-28 MED ORDER — MIDAZOLAM HCL 2 MG/2ML IJ SOLN
INTRAMUSCULAR | Status: DC | PRN
  Administered 2017-07-28: 1 mg via INTRAVENOUS

## 2017-07-28 MED ORDER — SODIUM CHLORIDE 0.9% FLUSH
10.0000 mL | INTRAVENOUS | Status: DC | PRN
  Administered 2017-07-29 (×2): 10 mL
  Filled 2017-07-28 (×2): qty 40

## 2017-07-28 MED ORDER — VERAPAMIL HCL 2.5 MG/ML IV SOLN
INTRAVENOUS | Status: AC
  Filled 2017-07-28: qty 2

## 2017-07-28 MED ORDER — SODIUM CHLORIDE 0.9% FLUSH: 3.0000 mL | INTRAVENOUS | Status: DC | PRN

## 2017-07-28 MED ORDER — HEPARIN (PORCINE) IN NACL 2-0.9 UNIT/ML-% IJ SOLN
INTRAMUSCULAR | Status: AC
  Filled 2017-07-28: qty 1000

## 2017-07-28 MED ORDER — FENTANYL CITRATE (PF) 100 MCG/2ML IJ SOLN
INTRAMUSCULAR | Status: AC
  Filled 2017-07-28: qty 2

## 2017-07-28 MED ORDER — PREGABALIN 50 MG PO CAPS
75.0000 mg | ORAL_CAPSULE | Freq: Three times a day (TID) | ORAL | Status: DC
  Administered 2017-07-28 – 2017-07-29 (×2): 75 mg via ORAL
  Filled 2017-07-28 (×2): qty 1

## 2017-07-28 MED ORDER — MIDAZOLAM HCL 2 MG/2ML IJ SOLN
INTRAMUSCULAR | Status: AC
  Filled 2017-07-28: qty 2

## 2017-07-28 MED ORDER — SODIUM CHLORIDE 0.9 % IV SOLN: INTRAVENOUS | Status: AC

## 2017-07-28 MED ORDER — SODIUM CHLORIDE 0.9% FLUSH: 3.0000 mL | Freq: Two times a day (BID) | INTRAVENOUS | Status: DC

## 2017-07-28 MED ORDER — SODIUM CHLORIDE 0.9 % IV SOLN
INTRAVENOUS | Status: AC | PRN
  Administered 2017-07-28: 10 mL/h via INTRAVENOUS

## 2017-07-28 MED ORDER — IOPAMIDOL (ISOVUE-370) INJECTION 76%
INTRAVENOUS | Status: DC | PRN
  Administered 2017-07-28: 75 mL via INTRA_ARTERIAL

## 2017-07-28 MED ORDER — SODIUM CHLORIDE 0.9 % IV SOLN
250.0000 mL | INTRAVENOUS | Status: DC | PRN
Start: 2017-07-28 — End: 2017-07-29

## 2017-07-28 MED ORDER — POTASSIUM CHLORIDE CRYS ER 20 MEQ PO TBCR
40.0000 meq | EXTENDED_RELEASE_TABLET | Freq: Once | ORAL | Status: AC
  Administered 2017-07-28: 40 meq via ORAL
  Filled 2017-07-28: qty 2

## 2017-07-28 MED ORDER — ASPIRIN 81 MG PO CHEW
81.0000 mg | CHEWABLE_TABLET | ORAL | Status: DC
  Filled 2017-07-28: qty 1

## 2017-07-28 MED ORDER — POTASSIUM CHLORIDE CRYS ER 20 MEQ PO TBCR
20.0000 meq | EXTENDED_RELEASE_TABLET | Freq: Two times a day (BID) | ORAL | Status: DC
  Administered 2017-07-29: 20 meq via ORAL
  Filled 2017-07-28: qty 1

## 2017-07-28 MED ORDER — SODIUM CHLORIDE 0.9% FLUSH
3.0000 mL | INTRAVENOUS | Status: DC | PRN
Start: 2017-07-28 — End: 2017-07-28

## 2017-07-28 MED ORDER — LIDOCAINE HCL 1 % IJ SOLN
INTRAMUSCULAR | Status: AC
  Filled 2017-07-28: qty 20

## 2017-07-28 MED ORDER — HEPARIN SODIUM (PORCINE) 1000 UNIT/ML IJ SOLN
INTRAMUSCULAR | Status: AC
  Filled 2017-07-28: qty 1

## 2017-07-28 MED ORDER — SODIUM CHLORIDE 0.9% FLUSH
3.0000 mL | Freq: Two times a day (BID) | INTRAVENOUS | Status: DC
  Administered 2017-07-28: 3 mL via INTRAVENOUS

## 2017-07-28 MED ORDER — FENTANYL CITRATE (PF) 100 MCG/2ML IJ SOLN
INTRAMUSCULAR | Status: DC | PRN
  Administered 2017-07-28: 25 ug via INTRAVENOUS

## 2017-07-28 MED ORDER — LIDOCAINE HCL (PF) 1 % IJ SOLN
INTRAMUSCULAR | Status: DC | PRN
  Administered 2017-07-28: 15 mL

## 2017-07-28 MED ORDER — HEPARIN (PORCINE) IN NACL 2-0.9 UNIT/ML-% IJ SOLN
INTRAMUSCULAR | Status: AC | PRN
  Administered 2017-07-28: 1000 mL

## 2017-07-28 MED ORDER — SODIUM CHLORIDE 0.9 % WEIGHT BASED INFUSION: 3.0000 mL/kg/h | INTRAVENOUS | Status: DC

## 2017-07-28 MED ORDER — ALUM & MAG HYDROXIDE-SIMETH 200-200-20 MG/5ML PO SUSP
30.0000 mL | Freq: Four times a day (QID) | ORAL | Status: DC | PRN
  Administered 2017-07-28: 30 mL via ORAL
  Filled 2017-07-28: qty 30

## 2017-07-28 MED ORDER — SODIUM CHLORIDE 0.9 % IV SOLN: 250.0000 mL | INTRAVENOUS | Status: DC | PRN

## 2017-07-28 MED ORDER — IOPAMIDOL (ISOVUE-370) INJECTION 76%
INTRAVENOUS | Status: AC
  Filled 2017-07-28: qty 125

## 2017-07-28 MED ORDER — SODIUM CHLORIDE 0.9 % WEIGHT BASED INFUSION: 1.0000 mL/kg/h | INTRAVENOUS | Status: DC

## 2017-07-28 SURGICAL SUPPLY — 14 items
CATH INFINITI 5 FR AL2 (CATHETERS) ×1 IMPLANT
CATH INFINITI MULTIPACK ST 5F (CATHETERS) ×1 IMPLANT
CATH SWAN GANZ 7F STRAIGHT (CATHETERS) ×1 IMPLANT
COVER PRB 48X5XTLSCP FOLD TPE (BAG) IMPLANT
COVER PROBE 5X48 (BAG) ×2
HOVERMATT SINGLE USE (MISCELLANEOUS) ×1 IMPLANT
KIT HEART LEFT (KITS) ×2 IMPLANT
KIT MICROINTRODUCER STIFF 5F (SHEATH) ×1 IMPLANT
PACK CARDIAC CATHETERIZATION (CUSTOM PROCEDURE TRAY) ×2 IMPLANT
SHEATH PINNACLE 5F 10CM (SHEATH) ×1 IMPLANT
SHEATH PINNACLE 7F 10CM (SHEATH) ×1 IMPLANT
TRANSDUCER W/STOPCOCK (MISCELLANEOUS) ×2 IMPLANT
WIRE EMERALD 3MM-J .035X150CM (WIRE) ×1 IMPLANT
WIRE EMERALD ST .035X150CM (WIRE) ×1 IMPLANT

## 2017-07-28 NOTE — Progress Notes (Signed)
5Fr right femoral arterial sheath aspirated and pulled.  Manual pressure held for 25mins.  7Fr right femoral venous sheath aspirated and pulled.  Pressure held over both sites for an additional 79mins, for a total of 50mins.  Hemostasis achieved.  Site level 0.  Tegaderm and gauze dressing applied to site.  Instructions given to pt, pt verbalizes understanding.  Rt DP pulse palpable.    Bedrest begins 11:15am.

## 2017-07-28 NOTE — Progress Notes (Signed)
Confirmed with CT that the 20g new PIV patient received today in the right [facility] will be fine for the cardiac CT scheduled tomorrow.

## 2017-07-28 NOTE — Interval H&P Note (Signed)
History and Physical Interval Note:  07/28/2017 9:32 AM  Richard [name]  has presented today for surgery, with the diagnosis of pre tavr  The various methods of treatment have been discussed with the patient and family. After consideration of risks, benefits and other options for treatment, the patient has consented to  Procedure(s): RIGHT/LEFT HEART CATH AND CORONARY/GRAFT ANGIOGRAPHY (N/A) as a surgical intervention .  The patient's history has been reviewed, patient examined, no change in status, stable for surgery.  I have reviewed the patient's chart and labs.  Questions were answered to the patient's satisfaction.     Sherren Mocha

## 2017-07-28 NOTE — Progress Notes (Signed)
Progress Note  Patient Name: Richard [name] Date of Encounter: 07/28/2017  Primary Cardiologist: Minus Breeding, MD   Subjective   No chest pain or dyspnea.   Inpatient Medications    Scheduled Meds: . allopurinol  450 mg Oral Daily  . amLODipine  10 mg Oral Daily  . amoxicillin-clavulanate  1 tablet Oral BID  . aspirin EC  81 mg Oral Daily  . atorvastatin  80 mg Oral QHS  . ezetimibe  10 mg Oral Daily  . furosemide  120 mg Oral BID  . insulin aspart  0-9 Units Subcutaneous TID WC  . lipase/protease/amylase  72,000 Units Oral TID WC  . metoprolol tartrate  12.5 mg Oral BID  . pantoprazole  40 mg Oral Daily  . pregabalin  75 mg Oral TID  . sodium chloride flush  3 mL Intravenous Q12H   Continuous Infusions: . sodium chloride    . sodium chloride     PRN Meds: sodium chloride, acetaminophen, cyclobenzaprine, lipase/protease/amylase, LORazepam, magnesium hydroxide, menthol-cetylpyridinium, nitroGLYCERIN, ondansetron (ZOFRAN) IV, sodium chloride flush, sodium chloride flush   Vital Signs    Vitals:   07/28/17 1120 07/28/17 1125 07/28/17 1130 07/28/17 1236  BP: (!) 116/43 (!) 116/43  (!) 116/53  Pulse: 96 (!) 51 60 61  Resp: 18 18 (!) 22   Temp:    98 F (36.7 C)  TempSrc:    Oral  SpO2: 93% 90% 94%   Weight:      Height:        Intake/Output Summary (Last 24 hours) at 07/28/2017 1446 Last data filed at 07/28/2017 0500 Gross per 24 hour  Intake 240 ml  Output 1380 ml  Net -1140 ml   Filed Weights   07/26/17 0558 07/27/17 0548 07/28/17 0515  Weight: 244 lb 6.4 oz (110.9 kg) 239 lb 8 oz (108.6 kg) 245 lb 11.2 oz (111.4 kg)    Telemetry    sinus - Personally Reviewed   Physical Exam    General: Well developed, well nourished, NAD  HEENT: OP clear, mucus membranes moist  SKIN: warm, dry. No rashes. Neuro: No focal deficits  Musculoskeletal: Muscle strength 5/5 all ext  Psychiatric: Mood and affect normal  Neck: No JVD, no carotid bruits, no  thyromegaly, no lymphadenopathy.  Lungs:Clear bilaterally, no wheezes, rhonci, crackles Cardiovascular: Regular rate and rhythm. Harsh systolic murmur.  Abdomen:Soft. Bowel sounds present. Non-tender.  Extremities: No lower extremity edema. Pulses are 2 + in the bilateral DP/PT. Right groin cath site ok   Labs    Chemistry Recent Labs  Lab 07/26/17 1323 07/27/17 0519 07/28/17 0412  NA 133* 134* 135  K 3.3* 3.4* 3.4*  CL 101 102 101  CO2 21* 23 22  GLUCOSE 200* 160* 171*  BUN 14 16 13   CREATININE 0.97 0.97 0.94  CALCIUM 7.7* 8.0* 7.9*  GFRNONAA >60 >60 >60  GFRAA >60 >60 >60  ANIONGAP 11 9 12      Hematology Recent Labs  Lab 07/26/17 1323 07/27/17 0519 07/28/17 0412  WBC 0.4* 0.5* 1.2*  RBC 3.42* 3.31* 3.44*  HGB 9.6* 9.3* 9.8*  HCT 30.1* 28.9* 30.4*  MCV 88.0 87.3 88.4  MCH 28.1 28.1 28.5  MCHC 31.9 32.2 32.2  RDW 17.2* 17.4* 17.8*  PLT 36* 40* 56*    Cardiac Enzymes Recent Labs  Lab 07/24/17 0119 07/24/17 0645  TROPONINI 0.10* 0.10*    Recent Labs  Lab 07/23/17 2215  TROPIPOC 0.09*    Patient Profile  77 y.o. male with past medical history of coronary artery disease status post coronary artery bypass and graft admitted with worsening dyspnea on exertion and chest pain.  He has severe aortic stenosis and TAVR wu in progress.  Also with B-cell lymphoma currently undergoing chemotherapy with pancytopenia.  Assessment & Plan    1. Severe aortic valve stenosis: Workup for TAVR is underway. Mean gradient of 51 mmHg by cath today. Will plan to get CT scans tomorrow prior to discharge home. Will try to complete TAVR workup over next few weeks and then allow him to finish his chemotherapy in 11 days before scheduling TAVR.  2. CAD: stable by cath today. Continue ASA, statin, Zetia, beta blocker.    3 Diffuse large B-cell lymphoma: Plans for chemotherapy in 11 days. Counts have stabilized and improving.   4. Pancytopenia: counts are stable. Will  follow  5. Chronic diastolic congestive heart failure: volume status is ok today. LVEDP 16 by cath. Wedge of 7. Continue Lasix.   6.  URI: Day 9/10 of therapy (he had completed 5 days of therapy prior to admission and was restarted on 07/25/17 to complete 10 day course).    For questions or updates, please contact [place] Please consult [email or web address] for contact info under Cardiology/STEMI.      Signed, Lauree Chandler, MD  07/28/2017, 2:46 PM

## 2017-07-28 NOTE — Consult Note (Signed)
[facility]  MULTIDISCIPLINARY HEART VALVE TEAM  CARDIOTHORACIC SURGERY CONSULTATION REPORT  Referring Provider is Dr. Sallyanne Kuster Primary Cardiologist is Dr. Percival Spanish PCP is Carollee Herter, Alferd Apa, DO  Chief Complaint  Patient presents with  . Shortness of Breath  . Chest Pain  Severe aortic stenosis  HPI:  The patient is a 77 year old gentleman with a history of hypertension, hyperlipidemia, type 2 diabetes, obstructive sleep apnea on CPAP, coronary artery disease status post CABG x5 in 2001 by me, and diffuse large B-cell lymphoma currently undergoing chemotherapy.  He has a history of aortic stenosis diagnosed in 2015 when he was noted to have a heart murmur on exam.  An echocardiogram at that time showed a mean gradient of 14 mmHg with a peak gradient of 31 mmHg.  The dimensionless index was 0.48.  Left ventricular ejection fraction was 55-65%.  He had a follow-up echocardiogram in July 2018 which showed an increase in the mean gradient to 40 mmHg with an aortic valve area of 0.78 cm.  He reportedly had a heart rate in the 30's intermittently but had no dizziness or syncope.  A Holter monitor showed occasional transient 2-1 heart block.  Then in August 2018 he was diagnosed with diffuse large B-cell lymphoma after a biopsy of a left axillary lymph node was performed.  His presenting complaint was some neck pain.  He had a Port-A-Cath placed on 03/06/2017 and was started on chemotherapy with Adriamycin, vincristine, cytoxan, and rituxan under the direction of Dr. Marin Olp.  His last course of chemo was on 07/18/2017 with plans for his last session on 08/08/2017.  He recently presented with a several month history of progressively worsening exertional fatigue and shortness of breath.  This had progressed to the point where he was getting shortness of breath with walking in his house.  This was associated with some chest tightness.  He has not had any dizziness or syncope.  He began having  symptoms at rest while watching TV and came to the [facility] emergency department.  His troponin was 0.09.  The curve stayed flat at 0.10.  He improved with diuresis.  Cardiac catheterization was performed earlier today and showed severe native three-vessel coronary disease with a patent right internal mammary artery graft to the posterior descending artery, patent left internal mammary artery graft to the LAD, and a patent sequential saphenous vein graft to intermediate and obtuse marginal branch.  1 of his vein grafts to a small diagonal branch was occluded.  He is a retired Restaurant manager, fast food and previously worked for [employer] until retiring about 11 years ago.  He says that he was playing golf 7 days/week until recently.  He is divorced and lives alone.  He has 2 children who live close by and 1 of his daughters is with him today.  Past Medical History:  Diagnosis Date  . Anemia   . Axillary adenopathy 02/25/2017  . Bradycardia    a. holter monitor has demonstrated HRs in 30s and [place]   . CAD (coronary artery disease)    a. s/p CABG 2001  . Chronic lower back pain   . Diffuse large B cell lymphoma ([place])   . Diverticulosis   . Esophageal stricture   . GERD (gastroesophageal reflux disease)   . History of gout   . HTN (hypertension)   . Mixed hyperlipidemia   . OSA on CPAP    with 2L O2 at night  . Pancytopenia ([place])  a. related to chemo therapy for B cell lymphoma  . Peptic stricture of esophagus   . Severe aortic stenosis   . Spinal stenosis   . Type II diabetes mellitus ([place])     Past Surgical History:  Procedure Laterality Date  . APPENDECTOMY  ~ 1952  . BACK SURGERY    . CATARACT EXTRACTION W/PHACO Left 11/06/2016   Procedure: CATARACT EXTRACTION PHACO AND INTRAOCULAR LENS PLACEMENT (IOC);  Surgeon: Estill Cotta, MD;  Location: [facility] ORS;  Service: Ophthalmology;  Laterality: Left;  Lot # [id number] H [place]: 01:09.4 AP%:25.2 CDE: 30.64  . CATARACT EXTRACTION  W/PHACO Right 12/04/2016   Procedure: CATARACT EXTRACTION PHACO AND INTRAOCULAR LENS PLACEMENT (IOC);  Surgeon: Estill Cotta, MD;  Location: [facility] ORS;  Service: Ophthalmology;  Laterality: Right;  [place] 1:25.9 AP% 24.1 CDE 39.10 Fluid Pack lot # [id number]  . COLONOSCOPY W/ BIOPSIES AND POLYPECTOMY  2013  . CORONARY ANGIOPLASTY  1993  . CORONARY ANGIOPLASTY WITH STENT PLACEMENT  05/1997   "1"  . CORONARY ARTERY BYPASS GRAFT  03/2000   "CABG X5"  . ESOPHAGEAL DILATION  X 3-4   Dr. Lyla Son; "last one was in the 1990's"  . ESOPHAGOGASTRODUODENOSCOPY     multiple  . FLEXIBLE SIGMOIDOSCOPY     multiple  . HYDRADENITIS EXCISION Left 02/25/2017   Procedure: EXCISION DEEP LEFT AXILLARY LYMPH NODE;  Surgeon: Fanny Skates, MD;  Location: [place];  Service: General;  Laterality: Left;  . KNEE ARTHROSCOPY Left 2011   meniscus repair  . LEFT HEART CATHETERIZATION WITH CORONARY/GRAFT ANGIOGRAM N/A 03/16/2014   Procedure: LEFT HEART CATHETERIZATION WITH Beatrix Fetters;  Surgeon: Burnell Blanks, MD;  Location: [facility] CATH LAB;  Service: Cardiovascular;  Laterality: N/A;  . LUMBAR LAMINECTOMY/DECOMPRESSION MICRODISCECTOMY Right 06/17/2013   Procedure: LUMBAR LAMINECTOMY MICRODISCECTOMY L4-L5 RIGHT EXCISION OF SYNOVIAL CYST RIGHT   (1 LEVEL) RIGHT PARTIAL FACETECTOMY;  Surgeon: Tobi Bastos, MD;  Location: [facility] ORS;  Service: Orthopedics;  Laterality: Right;  . MYELOGRAM  04/06/13   lumbar, Dr Gladstone Lighter  . PANENDOSCOPY    . PORTACATH PLACEMENT N/A 03/06/2017   Procedure: INSERTION PORT-A-CATH AND ASPIRATE SEROMA LEFT AXILLA;  Surgeon: Fanny Skates, MD;  Location: [place];  Service: General;  Laterality: N/A;  . RIGHT/LEFT HEART CATH AND CORONARY/GRAFT ANGIOGRAPHY N/A 07/28/2017   Procedure: RIGHT/LEFT HEART CATH AND CORONARY/GRAFT ANGIOGRAPHY;  Surgeon: Sherren Mocha, MD;  Location: [place] CV LAB;  Service: Cardiovascular;  Laterality: N/A;  . SHOULDER SURGERY Right 08/2010   screws  placed; "tendons tore off"  . SKIN CANCER EXCISION Right    "neck"  . TONSILLECTOMY  ~ 1954  . UPPER GI ENDOSCOPY  2013   Gastritis; Dr Carlean Purl  . VASECTOMY      Family History  Problem Relation Age of Onset  . Stroke Father   . Hypertension Father   . Pancreatic cancer Mother   . Diabetes Maternal Grandmother   . Stroke Maternal Grandmother   . Heart attack Paternal Grandmother   . Colon cancer Neg Hx   . Esophageal cancer Neg Hx   . Rectal cancer Neg Hx   . Stomach cancer Neg Hx   . Ulcers Neg Hx     Social History   Socioeconomic History  . Marital status: Divorced    Spouse name: Not on file  . Number of children: 2  . Years of education: Not on file  . Highest education level: Not on file  Social Needs  . Financial resource strain: Not  on file  . Food insecurity - worry: Not on file  . Food insecurity - inability: Not on file  . Transportation needs - medical: Not on file  . Transportation needs - non-medical: Not on file  Occupational History    Employer: RETIRED  Tobacco Use  . Smoking status: Never Smoker  . Smokeless tobacco: Never Used  Substance and Sexual Activity  . Alcohol use: No    Alcohol/week: 0.0 oz    Comment: "last drink was in 2012"( 03/15/2014)  . Drug use: No  . Sexual activity: Not Currently  Other Topics Concern  . Not on file  Social History Narrative   Divorced, lives with a roommate.   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    Current Facility-Administered Medications  Medication Dose Route Frequency Provider Last Rate Last Dose  . 0.9 %  sodium chloride infusion   Intravenous Continuous Sherren Mocha, MD      . 0.9 %  sodium chloride infusion  250 mL Intravenous PRN Sherren Mocha, MD      . acetaminophen (TYLENOL) tablet 650 mg  650 mg Oral Q4H PRN  Chakravartti, Jaidip, MD      . allopurinol (ZYLOPRIM) tablet 450 mg  450 mg Oral Daily Chakravartti, Jaidip, MD   450 mg at 07/27/17 0814  . amLODipine (NORVASC) tablet 10 mg  10 mg Oral Daily Chakravartti, Jaidip, MD   10 mg at 07/27/17 0816  . amoxicillin-clavulanate (AUGMENTIN) 875-125 MG per tablet 1 tablet  1 tablet Oral BID Minus Breeding, MD   1 tablet at 07/28/17 1255  . aspirin EC tablet 81 mg  81 mg Oral Daily Chakravartti, Jaidip, MD   81 mg at 07/28/17 0851  . atorvastatin (LIPITOR) tablet 80 mg  80 mg Oral QHS Chakravartti, Jaidip, MD   80 mg at 07/27/17 2124  . cyclobenzaprine (FLEXERIL) tablet 5 mg  5 mg Oral TID PRN Eileen Stanford, PA-C   5 mg at 07/25/17 1540  . ezetimibe (ZETIA) tablet 10 mg  10 mg Oral Daily Chakravartti, Jaidip, MD   10 mg at 07/27/17 0813  . furosemide (LASIX) tablet 120 mg  120 mg Oral BID Chakravartti, Jaidip, MD   120 mg at 07/28/17 1256  . insulin aspart (novoLOG) injection 0-9 Units  0-9 Units Subcutaneous TID WC Vedre, Ameeth, MD      . lipase/protease/amylase (CREON) capsule 36,000 Units  36,000 Units Oral PRN Croitoru, Mihai, MD      . lipase/protease/amylase (CREON) capsule 72,000 Units  72,000 Units Oral TID WC Chakravartti, Jaidip, MD   72,000 Units at 07/28/17 1255  . LORazepam (ATIVAN) tablet 0.5 mg  0.5 mg Oral Q6H PRN Chakravartti, Jaidip, MD      . magnesium hydroxide (MILK OF MAGNESIA) suspension 15 mL  15 mL Oral Daily PRN Croitoru, Mihai, MD   15 mL at 07/27/17 0832  . menthol-cetylpyridinium (CEPACOL) lozenge 3 mg  1 lozenge Oral PRN Nila Nephew, MD   3 mg at 07/26/17 2150  . metoprolol tartrate (LOPRESSOR) tablet 12.5 mg  12.5 mg Oral BID Lelon Perla, MD   12.5 mg at 07/27/17 2124  . nitroGLYCERIN (NITROSTAT) SL tablet 0.4 mg  0.4 mg Sublingual Q5 Min x 3 PRN Chakravartti, Jaidip, MD      . ondansetron (ZOFRAN) injection 4  mg  4 mg Intravenous Q6H PRN Chakravartti, Jaidip, MD      . pantoprazole (PROTONIX) EC tablet 40 mg   40 mg Oral Daily Chakravartti, Jaidip, MD   40 mg at 07/27/17 0816  . pregabalin (LYRICA) capsule 75 mg  75 mg Oral TID Doylene Canning, MD   75 mg at 07/27/17 2124  . sodium chloride flush (NS) 0.9 % injection 10-40 mL  10-40 mL Intracatheter PRN Croitoru, Mihai, MD      . sodium chloride flush (NS) 0.9 % injection 3 mL  3 mL Intravenous Q12H Sherren Mocha, MD      . sodium chloride flush (NS) 0.9 % injection 3 mL  3 mL Intravenous PRN Sherren Mocha, MD       Facility-Administered Medications Ordered in Other Encounters  Medication Dose Route Frequency Provider Last Rate Last Dose  . sodium chloride flush (NS) 0.9 % injection 10 mL  10 mL Intravenous PRN [name], MD   10 mL at 05/30/17 0920    Allergies  Allergen Reactions  . Benazepril Swelling    angioedema; he is not a candidate for any angiotensin receptor blockers because of this significant allergic reaction. Because of a history of documented adverse serious drug reaction;Medi Alert bracelet  is recommended  . Hctz [Hydrochlorothiazide] Anaphylaxis and Swelling    Tongue and lip swelling   . Aspirin Other (See Comments)    Gastritis, cant take 325 Mg aspirin   . Lactose Intolerance (Gi) Nausea And Vomiting      Review of Systems:   General:  normal appetite, poor energy, no weight gain, no weight loss, no fever  Cardiac:  has chest pain with exertion, occasional recent chest pain at rest, SOB with minimla exertion, some resting SOB, no PND, has orthopnea, no palpitations, no arrhythmia, no atrial fibrillation, has LE edema, no dizzy spells, no syncope  Respiratory:  has shortness of breath, no home oxygen, no productive cough, no dry cough, no bronchitis, no wheezing, no hemoptysis, no asthma, no pain with inspiration or cough, has sleep apnea, uses CPAP at night  GI:   no difficulty swallowing, no reflux, no frequent heartburn, no hiatal hernia, no abdominal pain, no constipation, no diarrhea, no  hematochezia, no hematemesis, no melena  GU:   no dysuria,  no frequency, no urinary tract infection, no hematuria, no enlarged prostate, no kidney stones, no kidney disease  Vascular:  no pain suggestive of claudication, no pain in feet, no leg cramps, no varicose veins, no DVT, no non-healing foot ulcer  Neuro:   no stroke, no TIA's, no seizures, no headaches, no temporary blindness one eye,  no slurred speech, no peripheral neuropathy, no chronic pain, no instability of gait, no memory/cognitive dysfunction  Musculoskeletal: no arthritis, no joint swelling, no myalgias, no difficulty walking, normal mobility   Skin:   no rash, no itching, no skin infections, no pressure sores or ulcerations  Psych:   no anxiety, no depression, no nervousness, no unusual recent stress  Eyes:   no blurry vision, no floaters, no recent vision changes,   ENT:   no hearing loss, no loose or painful teeth, partial dentures, last saw dentist this year  Hematologic:  no easy bruising, no abnormal bleeding, no clotting disorder, no frequent epistaxis  Endocrine:  has diabetes, does check CBG's at home        Physical Exam:   BP (!) 116/53 (BP Location: Left Arm)   Pulse 61   Temp 98  F (36.7 C) (Oral)   Resp (!) 22   Ht 6' (1.829 m)   Wt 111.4 kg (245 lb 11.2 oz)   SpO2 94%   BMI 33.32 kg/m   General:  Obese gentleman,   well-appearing  HEENT:  Unremarkable, NCAT, PERLA, EOMI, oropharynx clear  Neck:   no JVD, no bruits, no adenopathy or thyromegaly  Chest:   clear to auscultation, symmetrical breath sounds, no wheezes, no rhonchi   CV:   RRR, grade IV/VI crescendo/decrescendo murmur heard best at RSB,  no diastolic murmur  Abdomen:  soft, non-tender, no masses or organomegaly  Extremities:  warm, well-perfused, pulses palpable in feet, no LE edema  Rectal/GU  Deferred  Neuro:   Grossly non-focal and symmetrical throughout  Skin:   Clean and dry, no rashes, no breakdown   Diagnostic Tests:         [facility]                        [address], [place] [address]                            [phone]  ------------------------------------------------------------------- Echocardiography  Patient:    Richard [name], [place] MR #:       [id number] Study Date: 05/16/2017 Gender:     M Age:        89 Height:     175.3 cm Weight:     77.5 kg BSA:        1.95 m^2 Pt. Status: Room:   ATTENDING    Candee Furbish, M.D.  PERFORMING   [facility], Outpatient  SONOGRAPHER  [facility], [place]     Almyra Deforest [phone]  [place] [phone]  cc:  ------------------------------------------------------------------- LV EF: 55% -   60%  ------------------------------------------------------------------- Indications:      Aortic Stenosis (I35.0).  ------------------------------------------------------------------- History:   PMH:  Severe Aortic Stenosis (prior Mean Gradient 1mmHg, 01-16-17) Obstructive Sleep Apnea, Wenckebach Block, Edema, Diffuse Large Cell Non-Hodgkin&'s Lymphoma with Chemotherapy (4 treatments done and in Remission, with 4 Treatments left)  Dyspnea and murmur.  Coronary artery disease.  Aortic valve disease.  Risk factors:  Family history of coronary artery disease. Hypertension. Diabetes mellitus. Dyslipidemia.  ------------------------------------------------------------------- Study Conclusions  - HPI and indications: Severe Aortic Stenosis (prior Mean Gradient   70mmHg, 01-16-17) - Left ventricle: The cavity size was normal. Systolic function was   normal. The estimated ejection fraction was in the range of 55%   to 60%. Wall motion was normal; there were no regional wall   motion abnormalities. Doppler parameters are consistent with   abnormal left ventricular relaxation (grade 1 diastolic   dysfunction). GLS: -17.2% - Aortic valve: Valve mobility was restricted. There was severe   stenosis. There  was trivial regurgitation. Peak velocity (S): 492   cm/s. Mean gradient (S): 54 mm Hg. Valve area (VTI): 0.84 cm^2.   Valve area (Vmax): 0.83 cm^2. Valve area (Vmean): 0.82 cm^2. - Aorta: Ascending aortic diameter: 40 mm (S). - Ascending aorta: The ascending aorta was mildly dilated. - Mitral valve: Calcified annulus. There was mild regurgitation.   Valve area by continuity equation (using LVOT flow): 2 cm^2. - Left atrium: The atrium was severely dilated. Volume/bsa,  ES   (1-plane Simpson&'s, A4C): 61.5 ml/m^2. - Right ventricle: The cavity size was moderately dilated. Wall   thickness was normal. - Right atrium: The atrium was severely dilated. - Tricuspid valve: There was mild regurgitation.  Impressions:  - Aortic stenosis has advanced.  ------------------------------------------------------------------- Study data:  Comparison was made to the study of 01/16/2017.  Study status:  Routine.  Procedure:  Transthoracic echocardiography. Image quality was adequate.          Echocardiography.  M-mode, complete 2D, 3D, spectral Doppler, and color Doppler.  Birthdate: Patient birthdate: 1940/08/28.  Age:  Patient is 77 yr old.  Sex: Gender: male.    BMI: 25.2 kg/m^2.  Blood pressure:     140/72 Patient status:  Outpatient.  Study date:  Study date: 05/16/2017. Study time: 07:30 AM.  Location:  [facility]  -------------------------------------------------------------------  ------------------------------------------------------------------- Left ventricle:  The cavity size was normal. Systolic function was normal. The estimated ejection fraction was in the range of 55% to 60%. Wall motion was normal; there were no regional wall motion abnormalities. GLS: -17.2% Doppler parameters are consistent with abnormal left ventricular relaxation (grade 1 diastolic dysfunction).  ------------------------------------------------------------------- Aortic valve:   Trileaflet;  severely thickened, severely calcified leaflets. Valve mobility was restricted.  Doppler:   There was severe stenosis.   There was trivial regurgitation.    VTI ratio of LVOT to aortic valve: 0.22. Valve area (VTI): 0.84 cm^2. Indexed valve area (VTI): 0.43 cm^2/m^2. Peak velocity ratio of LVOT to aortic valve: 0.22. Valve area (Vmax): 0.83 cm^2. Indexed valve area (Vmax): 0.43 cm^2/m^2. Mean velocity ratio of LVOT to aortic valve: 0.22. Valve area (Vmean): 0.82 cm^2. Indexed valve area (Vmean): 0.42 cm^2/m^2.    Mean gradient (S): 54 mm Hg. Peak gradient (S): 97 mm Hg.  ------------------------------------------------------------------- Aorta:  Ascending aorta: The ascending aorta was mildly dilated.  ------------------------------------------------------------------- Mitral valve:   Calcified annulus. Mobility was not restricted. Doppler:  Transvalvular velocity was within the normal range. There was no evidence for stenosis. There was mild regurgitation. Valve area by pressure half-time: 3.14 cm^2. Indexed valve area by pressure half-time: 1.61 cm^2/m^2. Valve area by continuity equation (using LVOT flow): 2 cm^2. Indexed valve area by continuity equation (using LVOT flow): 1.02 cm^2/m^2.    Mean gradient (D): 5 mm Hg. Peak gradient (D): 6 mm Hg.  ------------------------------------------------------------------- Left atrium:  The atrium was severely dilated.  ------------------------------------------------------------------- Right ventricle:  The cavity size was moderately dilated. Wall thickness was normal. Systolic function was normal.  ------------------------------------------------------------------- Pulmonic valve:    Doppler:  Transvalvular velocity was within the normal range. There was no evidence for stenosis.  ------------------------------------------------------------------- Tricuspid valve:   Structurally normal valve.    Doppler: Transvalvular velocity  was within the normal range. There was mild regurgitation.  ------------------------------------------------------------------- Pulmonary artery:   The main pulmonary artery was normal-sized. Systolic pressure was within the normal range.  ------------------------------------------------------------------- Right atrium:  The atrium was severely dilated.  ------------------------------------------------------------------- Pericardium:  There was no pericardial effusion.  ------------------------------------------------------------------- Systemic veins: Inferior vena cava: The vessel was normal in size.  ------------------------------------------------------------------- Measurements   Left ventricle                           Value          Reference  LV ID, ED, PLAX chordal                  48.7  mm  43 - 52  LV ID, ES, PLAX chordal                  30.8  mm       23 - 38  LV fx shortening, PLAX chordal           37    %        >=29  LV PW thickness, ED                      17.5  mm       ----------  IVS/LV PW ratio, ED                      0.66           <=1.3  Stroke volume, 2D                        97    ml       ----------  Stroke volume/bsa, 2D                    50    ml/m^2   ----------  LV e&', lateral                           6.74  cm/s     ----------  LV E/e&', lateral                         18.84          ----------  LV e&', medial                            8.38  cm/s     ----------  LV E/e&', medial                          15.16          ----------  LV e&', average                           7.56  cm/s     ----------  LV E/e&', average                         16.8           ----------  Longitudinal strain, TDI                 17    %        ----------    Ventricular septum                       Value          Reference  IVS thickness, ED                        11.5  mm       ----------    LVOT                                     Value  Reference  LVOT ID, S                               22    mm       ----------  LVOT area                                3.8   cm^2     ----------  LVOT peak velocity, S                    108   cm/s     ----------  LVOT mean velocity, S                    71.5  cm/s     ----------  LVOT VTI, S                              25.4  cm       ----------    Aortic valve                             Value          Reference  Aortic valve peak velocity, S            492   cm/s     ----------  Aortic valve mean velocity, S            330   cm/s     ----------  Aortic valve VTI, S                      115   cm       ----------  Aortic mean gradient, S                  54    mm Hg    ----------  Aortic peak gradient, S                  97    mm Hg    ----------  VTI ratio, LVOT/AV                       0.22           ----------  Aortic valve area, VTI                   0.84  cm^2     ----------  Aortic valve area/bsa, VTI               0.43  cm^2/m^2 ----------  Velocity ratio, peak, LVOT/AV            0.22           ----------  Aortic valve area, peak velocity         0.83  cm^2     ----------  Aortic valve area/bsa, peak              0.43  cm^2/m^2 ----------  velocity  Velocity ratio, mean, LVOT/AV            0.22           ----------  Aortic valve area, mean velocity  0.82  cm^2     ----------  Aortic valve area/bsa, mean              0.42  cm^2/m^2 ----------  velocity  Aortic regurg pressure half-time         436   ms       ----------    Aorta                                    Value          Reference  Aortic root ID, ED                       36    mm       ----------  Ascending aorta ID, A-P, S               40    mm       ----------    Left atrium                              Value          Reference  LA ID, A-P, ES                           52    mm       ----------  LA ID/bsa, A-P                    (H)    2.66  cm/m^2   <=2.2  LA volume, S                             112   ml        ----------  LA volume/bsa, S                         57.4  ml/m^2   ----------  LA volume, ES, 1-p A4C                   120   ml       ----------  LA volume/bsa, ES, 1-p A4C               61.5  ml/m^2   ----------  LA volume, ES, 1-p A2C                   94.4  ml       ----------  LA volume/bsa, ES, 1-p A2C               48.4  ml/m^2   ----------    Mitral valve                             Value          Reference  Mitral E-wave peak velocity              127   cm/s     ----------  Mitral A-wave peak velocity              148   cm/s     ----------  Mitral mean velocity, D  96.9  cm/s     ----------  Mitral deceleration time          (H)    236   ms       150 - 230  Mitral pressure half-time                70    ms       ----------  Mitral mean gradient, D                  5     mm Hg    ----------  Mitral peak gradient, D                  6     mm Hg    ----------  Mitral E/A ratio, peak                   0.9            ----------  Mitral valve area, PHT, DP               3.14  cm^2     ----------  Mitral valve area/bsa, PHT, DP           1.61  cm^2/m^2 ----------  Mitral valve area, LVOT                  2     cm^2     ----------  continuity  Mitral valve area/bsa, LVOT              1.02  cm^2/m^2 ----------  continuity  Mitral annulus VTI, D                    48.3  cm       ----------  Mitral regurg VTI, PISA                  229   cm       ----------    Tricuspid valve                          Value          Reference  Tricuspid regurg peak velocity           300   cm/s     ----------  Tricuspid peak RV-RA gradient            36    mm Hg    ----------    Right atrium                             Value          Reference  RA ID, S-I, ES, A4C               (H)    69.3  mm       34 - 49  RA area, ES, A4C                  (H)    30.3  cm^2     8.3 - 19.5  RA volume, ES, A/L                       107   ml       ----------  RA volume/bsa, ES, A/L  54.8  ml/m^2   ----------    Right ventricle                          Value          Reference  TAPSE                                    21.1  mm       ----------  RV s&', lateral, S                        14.5  cm/s     ----------  Legend: (L)  and  (H)  mark values outside specified reference range.  ------------------------------------------------------------------- Prepared and Electronically Authenticated by  Candee Furbish, M.D. 2018-11-16T10:14:52  Physicians   Panel Physicians Referring Physician Case Authorizing Physician  Sherren Mocha, MD (Primary)    Procedures   RIGHT/LEFT HEART CATH AND CORONARY/GRAFT ANGIOGRAPHY  Conclusion   1.  Severe native three-vessel coronary artery disease with moderate distal left main stenosis, total occlusion of the LAD, total occlusion of the ramus intermedius, total occlusion of the first OM, and total occlusion of the RCA. 2.  Status post aortocoronary bypass surgery with continued patency of the RIMA to PDA, LIMA to LAD, and sequential saphenous vein graft to ramus intermedius and first OM branches of the circumflex. 3.  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  Severe calcific aortic stenosis with a mean transvalvular gradient of 51 mmHg  Indications   Severe aortic stenosis [I35.0 (ICD-10-CM)]  Procedural Details/Technique   Technical Details INDICATION: Severe symptomatic aortic stenosis  PROCEDURAL DETAILS: The right groin was prepped, draped, and anesthetized with 1% lidocaine. Using ultrasound guidance and a micropuncture technique, a 5 French sheath was placed in the right femoral artery and a 7 French sheath was placed in the right femoral vein. Both vessels were accessed with a front wall puncture. The ultrasound images captured and stored in the patient's chart. A Swan-Ganz catheter was used for the right heart catheterization. Standard protocol  was followed for recording of right heart pressures and sampling of oxygen saturations. Fick cardiac output was calculated. Standard Judkins catheters were used for selective coronary angiography and bypass graft angiography. The aortic valve is crossed with an AL 2 catheter and a straight wire. Left ventricular pressure is recorded. And aortic valve pullback is performed. There were no immediate procedural complications. The patient was transferred to the post catheterization recovery area for further monitoring.      Estimated blood loss <50 mL.  During this procedure the patient was administered the following to achieve and maintain moderate conscious sedation: Versed 1 mg, Fentanyl 25 mcg, while the patient's heart rate, blood pressure, and oxygen saturation were continuously monitored. The period of conscious sedation was 49 minutes, of which I was present face-to-face 100% of this time.  Coronary Findings   Diagnostic  Dominance: Right  Left Main  Mid LM to Dist LM lesion 60% stenosed  Mid LM to Dist LM lesion is 60% stenosed. The lesion is moderately calcified.  Left Anterior Descending  Prox LAD to Mid LAD lesion 100% stenosed  Prox LAD to Mid LAD lesion is 100% stenosed. The lesion is moderately calcified.  First Diagonal Branch  Collaterals  1st  Diag filled by collaterals from 3rd Diag.    Second Diagonal Branch  Collaterals  2nd Diag filled by collaterals from Dist LAD.    Ramus Intermedius  Ost Ramus lesion 100% stenosed  Ost Ramus lesion is 100% stenosed.  Left Circumflex  First Obtuse Marginal Branch  Ost 1st Mrg lesion 100% stenosed  Ost 1st Mrg lesion is 100% stenosed.  Right Coronary Artery  Prox RCA to Mid RCA lesion 100% stenosed  Prox RCA to Mid RCA lesion is 100% stenosed.  LIMA Graft to Mid LAD  Graft to Ost 1st Diag  Origin to Prox Graft lesion 100% stenosed  Origin to Prox Graft lesion is 100% stenosed. SVG-diagonal 100% occluded unchanged from prior  study  Sequential jump graft Graft to Ramus, 1st Mrg  Seq SVG- Ramus Intermedius and First OM graft was visualized by angiography. The graft exhibits minimal luminal irregularities. Widely patent graft fills the ramus intermedius, first OM, and retrograde fills the distal AV circumflex into the second OM.  Free RIMA Graft to RPDA  RIMA. The RIMA to PDA graft is widely patent.  Intervention   No interventions have been documented.  Left Heart   Aortic Valve There is severe aortic valve stenosis. The aortic valve is calcified. There is restricted aortic valve motion. The mean transaortic gradient is 51 mmHg. The peak to peak gradient is 68 mmHg. The calculated aortic valve area is 1.2 cm.  Coronary Diagrams   Diagnostic Diagram       Implants     No implant documentation for this case.  MERGE Images   Show images for CARDIAC CATHETERIZATION   Link to Procedure Log   Procedure Log    Hemo Data    Most Recent Value  Fick Cardiac Output 5.65 L/min  Fick Cardiac Output Index 2.44 (L/min)/BSA  Aortic Mean Gradient 51.2 mmHg  Aortic Peak Gradient 68 mmHg  Aortic Valve Area 1.19  Aortic Value Area Index 0.51 cm2/BSA  RA A Wave 6 mmHg  RA V Wave 6 mmHg  RA Mean 3 mmHg  RV Systolic Pressure 36 mmHg  RV Diastolic Pressure -2 mmHg  RV EDP 6 mmHg  PA Systolic Pressure 26 mmHg  PA Diastolic Pressure 8 mmHg  PA Mean 14 mmHg  PW A Wave 9 mmHg  PW V Wave 11 mmHg  PW Mean 7 mmHg  AO Systolic Pressure 96 mmHg  AO Diastolic Pressure 44 mmHg  AO Mean 65 mmHg  LV Systolic Pressure 211 mmHg  LV Diastolic Pressure -9 mmHg  LV EDP 16 mmHg  Arterial Occlusion Pressure Extended Systolic Pressure 941 mmHg  Arterial Occlusion Pressure Extended Diastolic Pressure 39 mmHg  Arterial Occlusion Pressure Extended Mean Pressure 60 mmHg  Left Ventricular Apex Extended Systolic Pressure 740 mmHg  Left Ventricular Apex Extended Diastolic Pressure 0 mmHg  Left Ventricular Apex Extended EDP  Pressure 17 mmHg  QP/QS 1  TPVR Index 5.75 HRUI  TSVR Index 26.71 HRUI  PVR SVR Ratio 0.11  TPVR/TSVR Ratio 0.22    STS Adult Cardiac Surgery Database Version 2.9 RISK SCORES Procedure: Isolated AVR CALCULATE  Risk of Mortality:  4.897%   Renal Failure:  8.488%   Permanent Stroke:  1.768%   Prolonged Ventilation:  13.467%   DSW Infection:  0.930%   Reoperation:  5.657%   Morbidity or Mortality:  20.141%   Short Length of Stay:  22.794%   Long Length of Stay:  12.454%    Impression:  This 77 year old gentleman has stage  D, severe, symptomatic aortic stenosis with NYHA class III symptoms of exertional fatigue and shortness of breath of several months duration consistent with chronic diastolic heart failure.  He has recently started having shortness of breath and chest discomfort at rest prompting admission to the hospital.  I have personally reviewed his 2D echocardiogram from November and cardiac catheterization from today.  His echocardiogram showed a trileaflet aortic valve had severely thickened, calcified, and restricted leaflets.  The mean transvalvular gradient was 54 mmHg with a peak gradient of 97 mmHg.  The dimensionless index was 0.22 with a aortic valve area of 0.82 cm.  Cardiac catheterization today showed severe native three-vessel coronary disease with 4 out of 5 patent bypass grafts and no significant ischemic territories.  The mean transvalvular gradient was measured at 51 mmHg.  I agree that aortic valve replacement is indicated in this patient.  I think he would be at high risk for redo sternotomy and aortic valve replacement since he has a patent right internal mammary graft to the posterior descending coronary artery and a patent left internal mammary graft to the LAD.  In addition he is 77 years old and undergoing treatment for B-cell lymphoma and has pancytopenia.  I think his risk would be considerably higher than that predicted by the STS risk calculator.  Transcatheter  aortic valve replacement would be a reasonable alternative for this patient with much less risk.  The patient and his sister were counseled at length regarding treatment alternatives for management of severe symptomatic aortic stenosis. The risks and benefits of surgical intervention has been discussed in detail. Long-term prognosis with medical therapy was discussed. Alternative approaches such as conventional surgical aortic valve replacement, transcatheter aortic valve replacement, and palliative medical therapy were compared and contrasted at length. This discussion was placed in the context of the patient's own specific clinical presentation and past medical history. All of their questions have been addressed.   The patient has been advised of a variety of complications that might develop including but not limited to risks of death, stroke, paravalvular leak, aortic dissection or other major vascular complications, aortic annulus rupture, device embolization, cardiac rupture or perforation, mitral regurgitation, acute myocardial infarction, arrhythmia, heart block or bradycardia requiring permanent pacemaker placement, congestive heart failure, respiratory failure, renal failure, pneumonia, infection, other late complications related to structural valve deterioration or migration, or other complications that might ultimately cause a temporary or permanent loss of functional independence or other long term morbidity. The patient provides full informed consent for the procedure as described and all questions were answered.    Plan:  The patient has been scheduled for a gated cardiac CT scan and a CTA of the chest, abdomen, and pelvis for tomorrow.  He will then be discharged to follow-up with oncology and have his last round of chemotherapy prior to proceeding with TAVR after he makes an adequate recovery.  He will require a second surgical evaluation with Dr. Roxy Manns.   I spent 60 minutes performing  this consultation and > 50% of this time was spent face to face counseling and coordinating the care of this patient's severe symptomatic aortic stenosis.   Gaye Pollack, MD 07/28/2017

## 2017-07-29 ENCOUNTER — Inpatient Hospital Stay (HOSPITAL_COMMUNITY): Payer: Medicare Other

## 2017-07-29 DIAGNOSIS — I350 Nonrheumatic aortic (valve) stenosis: Secondary | ICD-10-CM | POA: Diagnosis not present

## 2017-07-29 LAB — BASIC METABOLIC PANEL
Anion gap: 9 (ref 5–15)
BUN: 11 mg/dL (ref 6–20)
CO2: 22 mmol/L (ref 22–32)
Calcium: 7.6 mg/dL — ABNORMAL LOW (ref 8.9–10.3)
Chloride: 106 mmol/L (ref 101–111)
Creatinine, Ser: 0.88 mg/dL (ref 0.61–1.24)
GFR calc Af Amer: >60 mL/min (ref >60)
GFR calc non Af Amer: >60 mL/min (ref >60)
Glucose, Bld: 172 mg/dL — ABNORMAL HIGH (ref 65–99)
Potassium: 3.6 mmol/L (ref 3.5–5.1)
Sodium: 137 mmol/L (ref 135–145)

## 2017-07-29 LAB — GLUCOSE, CAPILLARY
GLUCOSE-CAPILLARY: 154 mg/dL — AB (ref 65–99)
Glucose-Capillary: 167 mg/dL — ABNORMAL HIGH (ref 65–99)

## 2017-07-29 MED ORDER — IOPAMIDOL (ISOVUE-370) INJECTION 76%
INTRAVENOUS | Status: AC
  Administered 2017-07-29: 95 mL
  Filled 2017-07-29: qty 100

## 2017-07-29 MED ORDER — METOPROLOL TARTRATE 5 MG/5ML IV SOLN: 10.0000 mg | INTRAVENOUS | Status: DC | PRN

## 2017-07-29 MED ORDER — HEPARIN SOD (PORK) LOCK FLUSH 100 UNIT/ML IV SOLN
500.0000 [IU] | INTRAVENOUS | Status: AC | PRN
  Administered 2017-07-29: 500 [IU]

## 2017-07-29 MED FILL — Lidocaine HCl Local Inj 1%: INTRAMUSCULAR | Qty: 20 | Status: AC

## 2017-07-29 MED FILL — Verapamil HCl IV Soln 2.5 MG/ML: INTRAVENOUS | Qty: 2 | Status: AC

## 2017-07-29 NOTE — Discharge Instructions (Signed)
PLEASE REMEMBER TO BRING ALL OF YOUR MEDICATIONS TO EACH OF YOUR FOLLOW-UP OFFICE VISITS.  PLEASE ATTEND ALL SCHEDULED FOLLOW-UP APPOINTMENTS.   Activity: Increase activity slowly as tolerated. You may shower, but no soaking baths (or swimming) for 1 week. No driving for 24 hours. No lifting over 5 lbs for 1 week. No sexual activity for 1 week.   You May Return to Work: in 1 week (if applicable)  Wound Care: You may wash cath site gently with soap and water. Keep cath site clean and dry. If you notice pain, swelling, bleeding or pus at your cath site, please call [phone].   Do not take your metformin today (07/29/17). You may resume this medication on 07/30/17 as previously prescribed.

## 2017-07-29 NOTE — Discharge Summary (Signed)
Discharge Summary    Patient ID: Richard [name],  MRN: [id number], DOB/AGE: Dec 10, 1940 77 y.o.  Admit date: 07/23/2017 Discharge date: 07/29/2017  Primary Care Provider: Roma Schanz R Primary Cardiologist: Minus Breeding, MD  Discharge Diagnoses    Active Problems:   Hyperlipidemia LDL goal <70   Essential hypertension   CAD, ARTERY BYPASS GRAFT   OSA (obstructive sleep apnea)   Type 2 diabetes mellitus with diabetic neuropathy, without long-term current use of insulin (HCC)   Spinal stenosis, lumbar region, with neurogenic claudication   Obesity   Bradycardia   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)   Pancytopenia (HCC)   Aortic stenosis, severe   NSTEMI (non-ST elevated myocardial infarction) (HCC)   Allergies Allergies  Allergen Reactions  . Benazepril Swelling    angioedema; he is not a candidate for any angiotensin receptor blockers because of this significant allergic reaction. Because of a history of documented adverse serious drug reaction;Medi Alert bracelet  is recommended  . Hctz [Hydrochlorothiazide] Anaphylaxis and Swelling    Tongue and lip swelling   . Aspirin Other (See Comments)    Gastritis, cant take 325 Mg aspirin   . Lactose Intolerance (Gi) Nausea And Vomiting    Diagnostic Studies/Procedures   Cardiac Catheterization 07/28/17: Conclusion   1. Severe native three-vessel coronary artery disease with moderate distal left main stenosis, total occlusion of the LAD, total occlusion of the ramus intermedius, total occlusion of the first OM, and total occlusion of the RCA. 2. Status post aortocoronary bypass surgery with continued patency of the RIMA to PDA, LIMA to LAD, and sequential saphenous vein graft to ramus intermedius and first OM branches of the circumflex. 3. 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 Severe calcific aortic  stenosis with a mean transvalvular gradient of 51 mmHg   _____________   History of Present Illness     Richard [name] is a 77 y.o. male with history of severe aortic stenosis, CAD status post 5 vessel CABG (LIMA to LAD, SVG to OM1/OM 2, SVG to distal RCA and known occluded SVG to D1), diffuse large B-cell lymphoma in remission currently undergoing chemotherapy, who presents 07/24/17 with worsening dyspnea on exertion and chest tightness.  The patient is noticed over the past several months gradual worsening of his exercise tolerance.  He is now the point where he cannot even walk a block without having significant dyspnea which and chest tightness which limits him.  This typically gets better with a short period of rest.  This afternoon he had significant dyspnea on minimal exertion, accompanied by his usual chest tightness.  However even while at rest this evening, he continued to feel somewhat short of breath and had tightness in his chest with some radiation to the shoulders.  Given these progressive symptoms, he presented to the [facility] ED for further evaluation.  In the ED, his vital signs were within normal limits.  His labs were notable for troponin of 0.09, pancytopenia with a white count of 2.6, and platelets of 75.  His EKG showed Wenckebach with no acute ischemic changes.  He was then admitted to the cardiology service for further management.  In regards to severe aortic stenosis, patient is being considered for TAVR following completion of his chemotherapy regimen.  He has 1 cycle remaining which she will complete in the next 2 weeks.  He was scheduled to  see Dr. Percival Spanish shortly to discuss next steps in possible TAVR workup.  On review of systems, he denied presyncope or syncope, neurologic symptoms, palpitations, or bleeding issues.  He had mild lower extremity edema that was reasonably well controlled with Lasix.  Finally, he had some sinus drainage and congestion recently which was  treated with a course of Augmentin.    Hospital Course     Consultants: CT Surgery    1. Severe aortic valve stenosis: s/p R/L Heart Cath in preparation for TAVR - noted to have mean transvalvular gradient of 10mmHg. Underwent Cardiac CT scan and CTA of the chest/abdomen/pelvis. He will be discharged home to complete the remainder of the TAVR work-up over the next few weeks (carotid US, PFTs, PT eval, etc). - To follow-up outpatient with CT Surgery and Cardiology  2. CAD: s/p R/L Heart Cath 07/28/17 with stable CAD and patent grafts with the exception of a chronic occlusion of the SVG to diagonal.  - continue home ASA, statin, zetia, beta blocker  3. Diffuse large B-cell lymphoma: In remission per PET scan 03/2017, now with ongoing chemotherapy (Adriamycin, vincristine, cytoxan, rituxan) s/p 7/8 sessions. Last session completed 07/18/17 with plans for final session 08/08/17. - Presented with pancytpenia likely 2/2 recent chemotherapy. While still pancytopenic, his counts are uptrending. Expect them to continue to recover.  - Patient to complete his final chemotherapy session 08/08/17. Once blood counts recover, anticipate he will undergo TAVR.   4. Pancytopenia: Presented with pancytpenia likely 2/2 recent chemotherapy. While still pancytopenic, his counts are uptrending. Expect them to continue to recover.   5. Chronic diastolic congestive heart failure:  Volume status appears stable on exam. LVEDP 49mmHg by Cath.   - Continue home lasix and beta blocker  6. URI: Diagnosed with a sinus infection prior to hospital admission. To complete 10 day course of antibiotics today.  - Continue supportive care with throat lozenges, salt water gargles, saline nasal spray, flonase.  _____________  Discharge Vitals Blood pressure 123/64, pulse 95, temperature 98 F (36.7 C), temperature source Oral, resp. rate 18, height 6' (1.829 m), weight 245 lb 12.8 oz (111.5 kg), SpO2 95 %.  Filed Weights    07/27/17 0548 07/28/17 0515 07/29/17 0457  Weight: 239 lb 8 oz (108.6 kg) 245 lb 11.2 oz (111.4 kg) 245 lb 12.8 oz (111.5 kg)   Physical Exam on day of discharge: GEN:No acute distress.   Neck:No JVD, no carotid bruits Cardiac: RRR, loud systolic murmur; no rubs or gallops.  Respiratory:Clear to auscultation bilaterally, no wheezes/ rales/ rhonchi XB:MWUX, Soft, nontender, non-distended  MS:No edema; No deformity. Neuro:Nonfocal, moving all extremities spontaneously Psych: Normal affect    Labs & Radiologic Studies    CBC Recent Labs    07/27/17 0519 07/28/17 0412  WBC 0.5* 1.2*  HGB 9.3* 9.8*  HCT 28.9* 30.4*  MCV 87.3 88.4  PLT 40* 56*   Basic Metabolic Panel Recent Labs    07/27/17 0519 07/28/17 0412  NA 134* 135  K 3.4* 3.4*  CL 102 101  CO2 23 22  GLUCOSE 160* 171*  BUN 16 13  CREATININE 0.97 0.94  CALCIUM 8.0* 7.9*   Liver Function Tests No results for input(s): AST, ALT, ALKPHOS, BILITOT, PROT, ALBUMIN in the last 72 hours. No results for input(s): LIPASE, AMYLASE in the last 72 hours. Cardiac Enzymes No results for input(s): CKTOTAL, CKMB, CKMBINDEX, TROPONINI in the last 72 hours. BNP Invalid input(s): POCBNP D-Dimer No results for input(s): DDIMER in the last  72 hours. Hemoglobin A1C No results for input(s): HGBA1C in the last 72 hours. Fasting Lipid Panel No results for input(s): CHOL, HDL, LDLCALC, TRIG, CHOLHDL, LDLDIRECT in the last 72 hours. Thyroid Function Tests No results for input(s): TSH, T4TOTAL, T3FREE, THYROIDAB in the last 72 hours.  Invalid input(s): FREET3 _____________  Dg Chest 2 View  Result Date: 07/23/2017 CLINICAL DATA:  Chest pain EXAM: CHEST  2 VIEW COMPARISON:  PET-CT 04/15/2017, radiograph 03/06/2017 FINDINGS: Right-sided central venous port tip overlies the SVC. Post sternotomy changes. Cardiomegaly. Mild coarse likely chronic interstitial opacity. No acute consolidation or effusion. Aortic atherosclerosis. No  pneumothorax. Surgical clips in the left axilla. IMPRESSION: No active cardiopulmonary disease.  Stable mild cardiomegaly. Electronically Signed   By: Donavan Foil M.D.   On: 07/23/2017 22:22   Disposition   Patient was seen and examined by Dr. Burt Knack who deemed patient as stable for discharge. Follow-up has been arranged. Discharge medications as listed below.   Follow-up Plans & Appointments    Follow-up Information    Minus Breeding, MD Follow up on 08/01/2017.   Specialty:  Cardiology Why:  Please arrive at 7:40am for your 8am appointment Contact information: [address] [place] [address] [phone]        [name], MD Follow up on 08/08/2017.   Specialty:  Oncology Why:  Please follow-up with Dr. Marin Olp as previously scheduled Contact information: [place] [address] [phone]            Discharge Medications   Allergies as of 07/29/2017      Reactions   Benazepril Swelling   angioedema; he is not a candidate for any angiotensin receptor blockers because of this significant allergic reaction. Because of a history of documented adverse serious drug reaction;Medi Alert bracelet  is recommended   Hctz [hydrochlorothiazide] Anaphylaxis, Swelling   Tongue and lip swelling   Aspirin Other (See Comments)   Gastritis, cant take 325 Mg aspirin    Lactose Intolerance (gi) Nausea And Vomiting      Medication List    TAKE these medications   allopurinol 300 MG tablet Commonly known as:  ZYLOPRIM Take 450 mg by mouth daily.   amLODipine 5 MG tablet Commonly known as:  NORVASC TAKE 2 TABLETS (10 MG TOTAL) BY MOUTH DAILY. What changed:  See the new instructions.   amoxicillin-clavulanate 875-125 MG tablet Commonly known as:  AUGMENTIN Take 1 tablet by mouth 2 (two) times daily.   aspirin 81 MG tablet Take 81 mg by mouth daily.   atorvastatin 80 MG tablet Commonly known as:  LIPITOR TAKE ONE  TABLET BY MOUTH AT BEDTIME   diphenoxylate-atropine 2.5-0.025 MG tablet Commonly known as:  LOMOTIL Take 2 pills at the onset of diarrhea, then take one pill after every loose stool. Maximum 8 pills per 24h.   esomeprazole 40 MG capsule Commonly known as:  NEXIUM Take 1 capsule (40 mg total) by mouth daily.   ezetimibe 10 MG tablet Commonly known as:  ZETIA TAKE ONE TABLET BY MOUTH DAILY   famciclovir 500 MG tablet Commonly known as:  FAMVIR Take 1 tablet (500 mg total) by mouth daily.   fluticasone 50 MCG/ACT nasal spray Commonly known as:  FLONASE INHALE ONE SPRAY INTO EACH NOSTRIL TWICE DAILY.   freestyle lancets Use BID to check blood sugar.  DX E11.9   furosemide 40 MG tablet Commonly known as:  LASIX Take 3 tablets (120 mg total)  2 (two) times daily by mouth.   glimepiride 2 MG tablet Commonly known as:  AMARYL TAKE 1 TABLET (2 MG TOTAL) BY MOUTH DAILY WITH BREAKFAST.   glucose 5 g chewable tablet Chew 15 g by mouth as needed for low blood sugar.   glucose blood test strip Commonly known as:  FREESTYLE LITE TEST BLOOD SUGAR bid   ICY HOT EX Apply 1 application topically daily as needed (muscle pain).   lidocaine-prilocaine cream Commonly known as:  EMLA Apply to affected area once What changed:    how much to take  how to take this  when to take this  reasons to take this  additional instructions   lipase/protease/amylase 36000 UNITS Cpep capsule Commonly known as:  CREON Take 2 capsules (72,000 Units total) by mouth 3 (three) times daily with meals. 1 capsule with snack.   LORazepam 0.5 MG tablet Commonly known as:  ATIVAN Take 1 tablet (0.5 mg total) by mouth every 6 (six) hours as needed (Nausea or vomiting).   metFORMIN 1000 MG tablet Commonly known as:  GLUCOPHAGE TAKE ONE AND ONE-HALF TABLETS BY MOUTH EVERY MORNING AND 1 TALBET IN THE EVENING. Notes to patient:  Do not take metformin today (07/29/17). You can resume taking this  medication on 07/30/17.   metoprolol tartrate 25 MG tablet Commonly known as:  LOPRESSOR TAKE ONE-HALF TABLET BY MOUTH TWICE DAILY What changed:    how much to take  how to take this  when to take this   nitroGLYCERIN 0.4 MG SL tablet Commonly known as:  NITROSTAT Place 1 tablet (0.4 mg total) under the tongue every 5 (five) minutes as needed for chest pain.   ondansetron 8 MG tablet Commonly known as:  ZOFRAN Take 1 tablet (8 mg total) by mouth 2 (two) times daily as needed for refractory nausea / vomiting. Start on day 3 after cyclophosphamide chemotherapy.   polycarbophil 625 MG tablet Commonly known as:  FIBERCON Take 625 mg by mouth daily.   potassium chloride SA 20 MEQ tablet Commonly known as:  KLOR-CON M20 Take 2 tablets (40 mEq total) 2 (two) times daily by mouth.   predniSONE 20 MG tablet Commonly known as:  DELTASONE Take 3 pills a day, with food, for 5 days. Start the day after each chemotherapy   pregabalin 75 MG capsule Commonly known as:  LYRICA Take 1 capsule (75 mg total) by mouth 3 (three) times daily.   prochlorperazine 10 MG tablet Commonly known as:  COMPAZINE Take 1 tablet (10 mg total) by mouth every 6 (six) hours as needed (Nausea or vomiting).         Outstanding Labs/Studies   Patient to complete the remainder of his TAVR work-up outpatient.   Duration of Discharge Encounter   Greater than 30 minutes including physician time.  Signed, Abigail Butts PA-C 07/29/2017, 9:13 AM   Patient seen, examined. Available data reviewed. Agree with findings, assessment, and plan as outlined by Roby Lofts, PA-C. On my exam today: Vitals:   07/29/17 0500 07/29/17 0756  BP: (!) 111/49 123/64  Pulse: 91 95  Resp:    Temp:  98 F (36.7 C)  SpO2: 92% 95%   Pt is alert and oriented, NAD HEENT: normal Neck: JVP - normal, carotids delayed Lungs: CTA bilaterally CV: irregular with 4/6 late peaking systolic murmur, absent A2, no diastolic  murmur Abd: soft, NT, Positive BS, no hepatomegaly Ext: no C/C/E, distal pulses intact and equal. Right groin site clear, dressing  removed Skin: warm/dry no rash  Discussed plan with patient as outlined above. Pre-TAVR CTA studies today, then discharge home this afternoon. FU with Dr Percival Spanish arrange. Dr Cyndia Bent and Dr Camillia Herter notes reviewed with plans for TAVR after he completes chemotherapy and blood counts in acceptable range.   Sherren Mocha, M.D. 07/29/2017 9:24 AM

## 2017-07-29 NOTE — Care Management Important Message (Signed)
Important Message  Patient Details  Name: Richard [name] MRN: [id number] Date of Birth: [birth year]   Medicare Important Message Given:  Yes    Bethena Roys, RN 07/29/2017, 11:44 AM

## 2017-07-30 ENCOUNTER — Telehealth: Payer: Self-pay

## 2017-07-30 NOTE — Consult Note (Signed)
           [facility] CM Primary Care Navigator  07/30/2017  Ahmere Hemenway [facility] March 06, 1941 [id number]   Attempt to seepatient at the bedsideto identify possible discharge needs but he was alreadydischargedper staff report.  Per MD note, patient presented on 07/24/17 with worsening dyspnea on exertion and chest tightness, admitted to the cardiology service for further management.  Patient was discharged homeyesterday.   Primary care provider's officeis listed asprovidingtransition of care (TOC) and call has already been made today per K. [name] Post, LPN.   Patient has also discharge instruction to follow-up withcardiology on 08/01/17 and oncology follow-up on 08/08/17.   For questions, please contact:  Dannielle Huh, BSN, RN- [facility] Primary Care Navigator  Telephone: [phone] [place]

## 2017-07-30 NOTE — Telephone Encounter (Signed)
07/30/17   TCM Hospital Follow UP  Transition Care Management Follow-up Telephone Call  ADMISSION DATE: 07/23/17  DISCHARGE DATE: 07/29/17   How have you been since you were released from the hospital?  Patient states he feels better now than he did while hospitalized because his home CPAP was better. The one he used in hospital did not fit proberly so he lost a los of sleep.  Do you understand why you were in the hospital? Yes   Do you understand the discharge instrcutions? Yes    Items Reviewed:  Medications reviewed: Yes   Allergies reviewed: Yes    Dietary changes reviewed: Heart healthy   Referrals reviewed: Follow up appointments have been made.   Functional Questionnaire:   Activities of Daily Living (ADLs):  Patient can perform al independently  Any patient concerns? None at this time   Confirmed importance and date/time of follow-up visits scheduled: Yes   Confirmed with patient if condition begins to worsen call PCP or go to the ER. Yes    Patient was given the office number and encouragred to call back with questions or concerns. Yes

## 2017-07-30 NOTE — Progress Notes (Signed)
HPI  The patient presents for follow up of coronary artery disease. He has had bypass several years ago. He was in the hospital this week with chest pain and elevated enzymes.  He had severe 3 vessel native disease.  He had moderate distal left main stenosis.  There was total occlusion of the LAD, RI , OM1 and RCA.  RIMA to the PDA, LIMA to the LAD and sequential SVG to RI and OM were patent.  He had severe AS.  He had diastolic HF.  He was treated for a URI.  He was also followed for pancytopenia as a result of chemo for lymphoma.    He still has some upper respiratory infection symptoms.  He is very short of breath when he ambulates but is not been having any chest pressure, neck or arm discomfort.  Is not noticing any new palpitations, presyncope or syncope.  He has some mild lower extremity swelling which seems to be chronic.   Allergies  Allergen Reactions  . Benazepril Swelling    angioedema; he is not a candidate for any angiotensin receptor blockers because of this significant allergic reaction. Because of a history of documented adverse serious drug reaction;Medi Alert bracelet  is recommended  . Hctz [Hydrochlorothiazide] Anaphylaxis and Swelling    Tongue and lip swelling   . Aspirin Other (See Comments)    Gastritis, cant take 325 Mg aspirin   . Lactose Intolerance (Gi) Nausea And Vomiting    Current Outpatient Medications  Medication Sig Dispense Refill  . allopurinol (ZYLOPRIM) 300 MG tablet Take 450 mg by mouth daily.     [name] amLODipine (NORVASC) 5 MG tablet TAKE 2 TABLETS (10 MG TOTAL) BY MOUTH DAILY. (Patient taking differently: TAKE 5 MG BY MOUTH TWICE DAILY.) 60 tablet 11  . amoxicillin-clavulanate (AUGMENTIN) 875-125 MG tablet Take 1 tablet by mouth 2 (two) times daily. 20 tablet 0  . aspirin 81 MG tablet Take 81 mg by mouth daily.    [name] atorvastatin (LIPITOR) 80 MG tablet TAKE ONE TABLET BY MOUTH AT BEDTIME 90 tablet 3  . diphenoxylate-atropine (LOMOTIL) 2.5-0.025  MG tablet Take 2 pills at the onset of diarrhea, then take one pill after every loose stool. Maximum 8 pills per 24h. 60 tablet 0  . esomeprazole (NEXIUM) 40 MG capsule Take 1 capsule (40 mg total) by mouth daily. 30 capsule 5  . ezetimibe (ZETIA) 10 MG tablet TAKE ONE TABLET BY MOUTH DAILY 90 tablet 3  . famciclovir (FAMVIR) 500 MG tablet Take 1 tablet (500 mg total) by mouth daily. 30 tablet 12  . fluticasone (FLONASE) 50 MCG/ACT nasal spray INHALE ONE SPRAY INTO EACH NOSTRIL TWICE DAILY. 16 g 6  . furosemide (LASIX) 40 MG tablet Take 3 tablets (120 mg total) 2 (two) times daily by mouth. 180 tablet 5  . glimepiride (AMARYL) 2 MG tablet TAKE 1 TABLET (2 MG TOTAL) BY MOUTH DAILY WITH BREAKFAST. 90 tablet 0  . glucose 5 g chewable tablet Chew 15 g by mouth as needed for low blood sugar.    [name] glucose blood (FREESTYLE LITE) test strip TEST BLOOD SUGAR bid 100 each 11  . Lancets (FREESTYLE) lancets Use BID to check blood sugar.  DX E11.9 100 each 6  . lidocaine-prilocaine (EMLA) cream Apply to affected area once (Patient taking differently: Apply 1 application topically as needed (to port). ) 30 g 3  . lipase/protease/amylase (CREON) 36000 UNITS CPEP capsule Take 2 capsules (72,000 Units total) by mouth 3 (  three) times daily with meals. 1 capsule with snack. 210 capsule 2  . LORazepam (ATIVAN) 0.5 MG tablet Take 1 tablet (0.5 mg total) by mouth every 6 (six) hours as needed (Nausea or vomiting). 30 tablet 0  . Menthol, Topical Analgesic, (ICY HOT EX) Apply 1 application topically daily as needed (muscle pain).    . metFORMIN (GLUCOPHAGE) 1000 MG tablet TAKE ONE AND ONE-HALF TABLETS BY MOUTH EVERY MORNING AND 1 TALBET IN THE EVENING. 225 tablet 1  . metoprolol tartrate (LOPRESSOR) 25 MG tablet TAKE ONE-HALF TABLET BY MOUTH TWICE DAILY (Patient taking differently: TAKE ONE-HALF TABLET BY MOUTH IN THE MORNING AND HALF TABLET IN THE EVENING) 90 tablet 3  . nitroGLYCERIN (NITROSTAT) 0.4 MG SL tablet Place 1  tablet (0.4 mg total) under the tongue every 5 (five) minutes as needed for chest pain. 25 tablet 3  . ondansetron (ZOFRAN) 8 MG tablet Take 1 tablet (8 mg total) by mouth 2 (two) times daily as needed for refractory nausea / vomiting. Start on day 3 after cyclophosphamide chemotherapy. 30 tablet 1  . polycarbophil (FIBERCON) 625 MG tablet Take 625 mg by mouth daily.    . potassium chloride SA (KLOR-CON M20) 20 MEQ tablet Take 2 tablets (40 mEq total) 2 (two) times daily by mouth. 120 tablet 5  . predniSONE (DELTASONE) 20 MG tablet Take 3 pills a day, with food, for 5 days. Start the day after each chemotherapy 120 tablet 0  . pregabalin (LYRICA) 75 MG capsule Take 1 capsule (75 mg total) by mouth 3 (three) times daily. 270 capsule 3  . prochlorperazine (COMPAZINE) 10 MG tablet Take 1 tablet (10 mg total) by mouth every 6 (six) hours as needed (Nausea or vomiting). 30 tablet 6   No current facility-administered medications for this visit.    Facility-Administered Medications Ordered in Other Visits  Medication Dose Route Frequency Provider Last Rate Last Dose  . sodium chloride flush (NS) 0.9 % injection 10 mL  10 mL Intravenous PRN [name], MD   10 mL at 05/30/17 0920    Past Medical History:  Diagnosis Date  . Anemia   . Axillary adenopathy 02/25/2017  . Bradycardia    a. holter monitor has demonstrated HRs in 30s and [place]   . CAD (coronary artery disease)    a. s/p CABG 2001  . Chronic lower back pain   . Diffuse large B cell lymphoma ([place])   . Diverticulosis   . Esophageal stricture   . GERD (gastroesophageal reflux disease)   . History of gout   . HTN (hypertension)   . Mixed hyperlipidemia   . OSA on CPAP    with 2L O2 at night  . Pancytopenia ([place])    a. related to chemo therapy for B cell lymphoma  . Peptic stricture of esophagus   . Severe aortic stenosis   . Spinal stenosis   . Type II diabetes mellitus ([place])     Past Surgical History:  Procedure  Laterality Date  . APPENDECTOMY  ~ 1952  . BACK SURGERY    . CATARACT EXTRACTION W/PHACO Left 11/06/2016   Procedure: CATARACT EXTRACTION PHACO AND INTRAOCULAR LENS PLACEMENT (IOC);  Surgeon: Estill Cotta, MD;  Location: [facility];  Service: Ophthalmology;  Laterality: Left;  Lot # [id number] H [place]: 01:09.4 AP%:25.2 CDE: 30.64  . CATARACT EXTRACTION W/PHACO Right 12/04/2016   Procedure: CATARACT EXTRACTION PHACO AND INTRAOCULAR LENS PLACEMENT (IOC);  Surgeon: Estill Cotta, MD;  Location: [facility];  Service: Ophthalmology;  Laterality: Right;  [place] 1:25.9 AP% 24.1 CDE 39.10 Fluid Pack lot # [id number]  . COLONOSCOPY W/ BIOPSIES AND POLYPECTOMY  2013  . CORONARY ANGIOPLASTY  1993  . CORONARY ANGIOPLASTY WITH STENT PLACEMENT  05/1997   "1"  . CORONARY ARTERY BYPASS GRAFT  03/2000   "CABG X5"  . ESOPHAGEAL DILATION  X 3-4   Dr. Lyla Son; "last one was in the 1990's"  . ESOPHAGOGASTRODUODENOSCOPY     multiple  . FLEXIBLE SIGMOIDOSCOPY     multiple  . HYDRADENITIS EXCISION Left 02/25/2017   Procedure: EXCISION DEEP LEFT AXILLARY LYMPH NODE;  Surgeon: Fanny Skates, MD;  Location: [facility];  Service: General;  Laterality: Left;  . KNEE ARTHROSCOPY Left 2011   meniscus repair  . LEFT HEART CATHETERIZATION WITH CORONARY/GRAFT ANGIOGRAM N/A 03/16/2014   Procedure: LEFT HEART CATHETERIZATION WITH Beatrix Fetters;  Surgeon: Burnell Blanks, MD;  Location: [facility] CATH LAB;  Service: Cardiovascular;  Laterality: N/A;  . LUMBAR LAMINECTOMY/DECOMPRESSION MICRODISCECTOMY Right 06/17/2013   Procedure: LUMBAR LAMINECTOMY MICRODISCECTOMY L4-L5 RIGHT EXCISION OF SYNOVIAL CYST RIGHT   (1 LEVEL) RIGHT PARTIAL FACETECTOMY;  Surgeon: Tobi Bastos, MD;  Location: [facility];  Service: Orthopedics;  Laterality: Right;  . MYELOGRAM  04/06/13   lumbar, Dr Gladstone Lighter  . PANENDOSCOPY    . PORTACATH PLACEMENT N/A 03/06/2017   Procedure: INSERTION PORT-A-CATH AND ASPIRATE SEROMA LEFT AXILLA;  Surgeon: Fanny Skates, MD;  Location: [facility];  Service: General;  Laterality: N/A;  . RIGHT/LEFT HEART CATH AND CORONARY/GRAFT ANGIOGRAPHY N/A 07/28/2017   Procedure: RIGHT/LEFT HEART CATH AND CORONARY/GRAFT ANGIOGRAPHY;  Surgeon: Sherren Mocha, MD;  Location: [place] [facility] CV LAB;  Service: Cardiovascular;  Laterality: N/A;  . SHOULDER SURGERY Right 08/2010   screws placed; "tendons tore off"  . SKIN CANCER EXCISION Right    "neck"  . TONSILLECTOMY  ~ 1954  . UPPER GI ENDOSCOPY  2013   Gastritis; Dr Carlean Purl  . VASECTOMY      ROS:  As stated in the HPI and negative for all other systems.  PHYSICAL EXAM BP 115/62   Pulse 75   Ht 6' (1.829 m)   Wt 252 lb 6.4 oz (114.5 kg)   BMI 34.23 kg/m   GENERAL:  Well appearing NECK:  No jugular venous distention, waveform within normal limits, carotid upstroke brisk and symmetric, no bruits, no thyromegaly LUNGS:  Clear to auscultation bilaterally CHEST:  Unremarkable HEART:  PMI not displaced or sustained,S1 and S2 within normal limits, no S3, no S4, no clicks, no rubs, 3 of 6 apical systolic murmur radiating slightly at the aortic outflow tract, murmurs ABD:  Flat, positive bowel sounds normal in frequency in pitch, no bruits, no rebound, no guarding, no midline pulsatile mass, no hepatomegaly, no splenomegaly EXT:  2 plus pulses throughout, mild ankle edema, no cyanosis no clubbing   EKG:  NA  Lab Results  Component Value Date   CHOL 125 11/21/2016   TRIG 195.0 (H) 11/21/2016   HDL 29.20 (L) 11/21/2016   LDLCALC 57 11/21/2016   LDLDIRECT 71.0 09/18/2015   Lab Results  Component Value Date   HGBA1C 8.7 (H) 02/18/2017    ASSESSMENT AND PLAN  CAD:  He had patent grafts as above.  We will continue with medical management.  He has disease as listed above on the cath during this hospitalization.  No change in therapy at this point.  AS:  He will have follow up for work up for TAVR after he has completed  treatment for chemotherapy.  The next follow-up  appointment after oncology will be for further testing preprocedure and arrangements will be made for follow-up TAVR clinic.  He is on high dose diuretic and I will check a basic metabolic profile today.  PANCYTOPENIA: I will check a CBC.  CAROTID STENOSIS:   This was mild on Doppler in Nov.  No further imaging is indicated.    DYSLIPIDEMIA:   LDL is as above.  He will remain on the meds as listed.    DM:  This is being followed by Carollee Herter, Alferd Apa, DO .   The A1c was increased to 8.7 from 7.21.

## 2017-08-01 ENCOUNTER — Other Ambulatory Visit: Payer: Medicare Other

## 2017-08-01 ENCOUNTER — Encounter: Payer: Self-pay | Admitting: Cardiology

## 2017-08-01 ENCOUNTER — Ambulatory Visit: Payer: Medicare Other

## 2017-08-01 ENCOUNTER — Ambulatory Visit (INDEPENDENT_AMBULATORY_CARE_PROVIDER_SITE_OTHER): Payer: Medicare Other | Admitting: Cardiology

## 2017-08-01 ENCOUNTER — Ambulatory Visit: Payer: Medicare Other | Admitting: Hematology & Oncology

## 2017-08-01 VITALS — BP 115/62 | HR 75 | Ht 72.0 in | Wt 252.4 lb

## 2017-08-01 DIAGNOSIS — Z79899 Other long term (current) drug therapy: Secondary | ICD-10-CM

## 2017-08-01 DIAGNOSIS — I350 Nonrheumatic aortic (valve) stenosis: Secondary | ICD-10-CM

## 2017-08-01 DIAGNOSIS — D61818 Other pancytopenia: Secondary | ICD-10-CM | POA: Diagnosis not present

## 2017-08-01 DIAGNOSIS — I25118 Atherosclerotic heart disease of native coronary artery with other forms of angina pectoris: Secondary | ICD-10-CM

## 2017-08-01 NOTE — Patient Instructions (Signed)
Medication Instructions:  Continue current medications  If you need a refill on your cardiac medications before your next appointment, please call your pharmacy.  Labwork: CBC and BMP HERE IN OUR OFFICE AT [facility]  Take the provided lab slips for you to take with you to the lab for you blood draw.   You will NOT need to fast   You may go to any [facility] lab that is convenient for you however, we do have a lab in our office that is able to assist you. You do NOT need an appointment for our lab. Once in our office lobby there is a podium to the right of the check-in desk where you are to sign-in and ring a doorbell to alert [place] you are here. Lab is open Monday-Friday from 8:00am to 4:00pm; and is closed for lunch from 12:45p-1:45pm   Testing/Procedures: None Ordered  Follow-Up: Your physician wants you to follow-up in: After Tavr appointment.     Thank you for choosing [facility] at [facility]!!

## 2017-08-02 LAB — CBC
HEMATOCRIT: 26.7 % — AB (ref 37.5–51.0)
Hemoglobin: 8.6 g/dL — ABNORMAL LOW (ref 13.0–17.7)
MCH: 27.9 pg (ref 26.6–33.0)
MCHC: 32.2 g/dL (ref 31.5–35.7)
MCV: 87 fL (ref 79–97)
PLATELETS: 100 10*3/uL — AB (ref 150–379)
RBC: 3.08 x10E6/uL — ABNORMAL LOW (ref 4.14–5.80)
RDW: 18.4 % — AB (ref 12.3–15.4)
WBC: 3.5 10*3/uL (ref 3.4–10.8)

## 2017-08-02 LAB — BASIC METABOLIC PANEL
BUN/Creatinine Ratio: 15 (ref 10–24)
BUN: 12 mg/dL (ref 8–27)
CALCIUM: 7.3 mg/dL — AB (ref 8.6–10.2)
CHLORIDE: 101 mmol/L (ref 96–106)
CO2: 18 mmol/L — AB (ref 20–29)
Creatinine, Ser: 0.8 mg/dL (ref 0.76–1.27)
GFR, EST AFRICAN AMERICAN: 98 mL/min/{1.73_m2} (ref >59)
GFR, EST NON AFRICAN AMERICAN: 85 mL/min/{1.73_m2} (ref >59)
Glucose: 158 mg/dL — ABNORMAL HIGH (ref 65–99)
POTASSIUM: 4.2 mmol/L (ref 3.5–5.2)
Sodium: 137 mmol/L (ref 134–144)

## 2017-08-05 ENCOUNTER — Encounter: Payer: Self-pay | Admitting: Family Medicine

## 2017-08-05 ENCOUNTER — Ambulatory Visit (INDEPENDENT_AMBULATORY_CARE_PROVIDER_SITE_OTHER): Payer: Medicare Other | Admitting: Family Medicine

## 2017-08-05 VITALS — BP 125/39 | HR 87 | Temp 97.8°F | Resp 16 | Ht 72.05 in | Wt 251.0 lb

## 2017-08-05 DIAGNOSIS — I25118 Atherosclerotic heart disease of native coronary artery with other forms of angina pectoris: Secondary | ICD-10-CM

## 2017-08-05 DIAGNOSIS — E785 Hyperlipidemia, unspecified: Secondary | ICD-10-CM | POA: Diagnosis not present

## 2017-08-05 DIAGNOSIS — E1151 Type 2 diabetes mellitus with diabetic peripheral angiopathy without gangrene: Secondary | ICD-10-CM | POA: Diagnosis not present

## 2017-08-05 DIAGNOSIS — E1165 Type 2 diabetes mellitus with hyperglycemia: Secondary | ICD-10-CM

## 2017-08-05 DIAGNOSIS — I10 Essential (primary) hypertension: Secondary | ICD-10-CM | POA: Diagnosis not present

## 2017-08-05 DIAGNOSIS — IMO0002 Reserved for concepts with insufficient information to code with codable children: Secondary | ICD-10-CM

## 2017-08-05 DIAGNOSIS — I350 Nonrheumatic aortic (valve) stenosis: Secondary | ICD-10-CM

## 2017-08-05 DIAGNOSIS — C8332 Diffuse large B-cell lymphoma, intrathoracic lymph nodes: Secondary | ICD-10-CM | POA: Diagnosis not present

## 2017-08-05 DIAGNOSIS — I214 Non-ST elevation (NSTEMI) myocardial infarction: Secondary | ICD-10-CM | POA: Diagnosis not present

## 2017-08-05 LAB — COMPREHENSIVE METABOLIC PANEL
ALBUMIN: 4.1 g/dL (ref 3.5–5.2)
ALK PHOS: 105 U/L (ref 39–117)
ALT: 17 U/L (ref 0–53)
AST: 17 U/L (ref 0–37)
BILIRUBIN TOTAL: 0.6 mg/dL (ref 0.2–1.2)
BUN: 11 mg/dL (ref 6–23)
CO2: 29 mEq/L (ref 19–32)
Calcium: 9.6 mg/dL (ref 8.4–10.5)
Chloride: 101 mEq/L (ref 96–112)
Creatinine, Ser: 0.88 mg/dL (ref 0.40–1.50)
GFR: 88.78 mL/min (ref >60.00)
Glucose, Bld: 110 mg/dL — ABNORMAL HIGH (ref 70–99)
POTASSIUM: 3.7 meq/L (ref 3.5–5.1)
SODIUM: 141 meq/L (ref 135–145)
TOTAL PROTEIN: 7 g/dL (ref 6.0–8.3)

## 2017-08-05 LAB — CBC WITH DIFFERENTIAL/PLATELET
BASOS ABS: 0.1 10*3/uL (ref 0.0–0.1)
Basophils Relative: 1 % (ref 0.0–3.0)
EOS ABS: 0 10*3/uL (ref 0.0–0.7)
EOS PCT: 0.4 % (ref 0.0–5.0)
HCT: 30.5 % — ABNORMAL LOW (ref 39.0–52.0)
HEMOGLOBIN: 9.8 g/dL — AB (ref 13.0–17.0)
Lymphocytes Relative: 8.5 % — ABNORMAL LOW (ref 12.0–46.0)
Lymphs Abs: 0.4 10*3/uL — ABNORMAL LOW (ref 0.7–4.0)
MCHC: 32.1 g/dL (ref 30.0–36.0)
MCV: 87.7 fl (ref 78.0–100.0)
MONO ABS: 0.5 10*3/uL (ref 0.1–1.0)
Monocytes Relative: 9.4 % (ref 3.0–12.0)
NEUTROS PCT: 80.7 % — AB (ref 43.0–77.0)
Neutro Abs: 4.1 10*3/uL (ref 1.4–7.7)
Platelets: 158 10*3/uL (ref 150.0–400.0)
RBC: 3.48 Mil/uL — AB (ref 4.22–5.81)
RDW: 20.5 % — ABNORMAL HIGH (ref 11.5–15.5)
WBC: 5.1 10*3/uL (ref 4.0–10.5)

## 2017-08-05 LAB — LIPID PANEL
CHOLESTEROL: 111 mg/dL (ref 0–200)
HDL: 27.3 mg/dL — ABNORMAL LOW (ref >39.00)
NonHDL: 83.58
Total CHOL/HDL Ratio: 4
Triglycerides: 218 mg/dL — ABNORMAL HIGH (ref 0.0–149.0)
VLDL: 43.6 mg/dL — AB (ref 0.0–40.0)

## 2017-08-05 LAB — LDL CHOLESTEROL, DIRECT: LDL DIRECT: 51 mg/dL

## 2017-08-05 LAB — HEMOGLOBIN A1C: Hgb A1c MFr Bld: 6.5 % (ref 4.6–6.5)

## 2017-08-05 NOTE — Assessment & Plan Note (Signed)
Well controlled, no changes to meds. Encouraged heart healthy diet such as the DASH diet and exercise as tolerated.  °

## 2017-08-05 NOTE — Assessment & Plan Note (Signed)
Per onc Last tx friday

## 2017-08-05 NOTE — Patient Instructions (Signed)
Aortic Valve Stenosis Aortic valve stenosis is a narrowing of the aortic valve. The aortic valve opens and closes to regulate blood flow between the lower left chamber of the heart (left ventricle) and the blood vessel that leads away from the heart (aorta). When the aortic valve becomes narrow, it makes it difficult for the heart to pump blood into the aorta, which causes the heart to work harder. The extra work can weaken the heart over time. Aortic valve stenosis can range from mild to severe. If untreated, it can become more severe over time and can lead to heart failure. What are the causes? This condition may be caused by:  Buildup of calcium around and on the valve. This can occur with aging. This is the most common cause of aortic valve stenosis.  Birth defect.  Rheumatic fever.  Radiation to the chest. What increases the risk? You may be more likely to develop this condition if:  You are over the age of 65.  You were born with an abnormal bicuspid valve. What are the signs or symptoms? You may have no symptoms until your condition becomes severe. It may take 10-20 years for mild or moderate aortic valve stenosis to become severe. Symptoms may include:  Shortness of breath. This may get worse during physical activity.  Feeling unusually weak and tired (fatigue).  Extreme discomfort in the chest, neck, or arm (angina).  A heartbeat that is irregular or faster than normal (palpitations).  Dizziness or fainting. This may happen when you get physically tired or after you take certain heart medicines, such as nitroglycerin. How is this diagnosed? This condition may be diagnosed with:  A physical exam.  Echocardiogram. This is a type of imaging test that uses sound waves (ultrasound) to make an image of your heart. There are two types that may be used:  Transthoracic echocardiogram (TTE). This type of echocardiogram is noninvasive, and it is usually done  first.  Transesophageal echocardiogram (TEE). This type of echocardiogram is done by passing a flexible tube down your esophagus. The heart and the esophagus are close to each other, so your health care provider can take very clear, detailed pictures of the heart using this type of test.  Cardiac catheterization. In this procedure, a thin, flexible tube (catheter) is passed through a large vein in your neck, groin, or arm. This procedure provides information about arteries, structures, blood pressure, and oxygen levels in your heart.  Electrocardiogram (ECG). This records the electrical impulses of your heart and assesses heart function.  Stress tests. These are tests that evaluate the blood supply to your heart and your heart's response to exercise.  Blood tests. You may work with a health care provider who specializes in the heart (cardiologist). How is this treated? Treatment depends on how severe your condition is and what your symptoms are. You will need to have your heart checked regularly to make sure that your condition is not getting worse or causing serious problems. If your condition is mild, no treatment may be needed. Treatment may include:  Medicines that help keep your heart rate regular.  Medicines that thin your blood (anticoagulants) to prevent the formation of blood clots.  Antibiotic medicines to help prevent infection.  Surgery to replace your aortic valve. This is the most common treatment for aortic valve stenosis. Several types of surgeries are available. The surgery may be done through a large incision over your heart (open heart surgery), or it may be done using a minimally   invasive technique (transcatheter aortic valve replacement, or TAVR). Follow these instructions at home: Lifestyle    Limit alcohol intake to no more than 1 drink per day for nonpregnant women and 2 drinks per day for men. One drink equals 12 oz of beer, 5 oz of wine, or 1 oz of hard  liquor.  Do not use any tobacco products, such as cigarettes, chewing tobacco, or e-cigarettes. If you need help quitting, ask your health care provider.  Work with your health care provider to manage your blood pressure and cholesterol.  Maintain a healthy weight. Eating and drinking   Follow instructions from your health care provider about eating or drinking restrictions.  Limit how much caffeine you drink. Caffeine can affect your heart's rate and rhythm.  Drink enough fluid to keep your urine clear or pale yellow.  Eat a heart-healthy diet. This should include plenty of fresh fruits and vegetables. If you eat meat, it should be lean cuts. Avoid foods that are:  High in salt, saturated fat, or sugar.  Canned or highly processed.  Fried. Activity   Return to your normal activities as told by your health care provider. Ask your health care provider what activities are safe for you.  Exercise regularly, as told by your health care provider. Ask your health care provider what types of exercise are safe for you.  If your aortic valve stenosis is mild, you may need to avoid only very intense physical activity. The more severe your aortic valve stenosis is, the more activities you may need to avoid. General instructions   Take over-the-counter and prescription medicines only as told by your health care provider.  If you are a woman and you plan to become pregnant, talk with your health care provider before you become pregnant.  Tell all health care providers who care for you that you have aortic valve stenosis.  Keep all follow-up visits as told by your health care provider. This is important. Contact a health care provider if:  You have a fever. Get help right away if:  You develop chest pain or tightness.  You develop shortness of breath or difficulty breathing.  You feel light-headed.  You feel like you might faint.  Your heartbeat is irregular or faster than  normal. These symptoms may represent a serious problem that is an emergency. Do not wait to see if the symptoms will go away. Get medical help right away. Call your local emergency services (911 in the [place] Do not drive yourself to the hospital. This information is not intended to replace advice given to you by your health care provider. Make sure you discuss any questions you have with your health care provider. Document Released: 03/16/2003 Document Revised: 11/23/2015 Document Reviewed: 05/21/2015 Elsevier Interactive Patient Education  2017 [employer]  

## 2017-08-05 NOTE — Progress Notes (Signed)
Subjective:  I acted as a Education administrator for [employer], RMA   Patient ID: Richard [name], male    DOB: September 14, 1940, 77 y.o.   MRN: [id number]  Chief Complaint  Patient presents with  . Follow-up    HPI  Patient is in today for hospital follow up for sob and chest pain.   The AS is severe and he is waiting for surgery for repair.  His last chemo tx is Friday and oncology will have to clear him.  Wbc and platelets were low and they had to wait to do the cath. Other than fatigue and sob with exertion pt is doing well.  He is excited to potentially be done with chemo and have his surgery done   Patient Care Team: Carollee Herter, Alferd Apa, DO as PCP - General (Family Medicine) Minus Breeding, MD as PCP - Cardiology (Cardiology) Dingeldein, Remo Lipps, MD (Ophthalmology) Rigoberto Noel, MD as Consulting Physician (Pulmonary Disease) [name], MD as Consulting Physician (Oncology) Minus Breeding, MD as Consulting Physician (Cardiology) Parrett, Fonnie Mu, NP as Nurse Practitioner (Pulmonary Disease) [facility] (Dermatology) Latanya Maudlin, MD as Consulting Physician (Orthopedic Surgery)   Past Medical History:  Diagnosis Date  . Anemia   . Axillary adenopathy 02/25/2017  . Bradycardia    a. holter monitor has demonstrated HRs in 30s and [place]   . CAD (coronary artery disease)    a. s/p CABG 2001  . Chronic lower back pain   . Diffuse large B cell lymphoma ([place])   . Diverticulosis   . Esophageal stricture   . GERD (gastroesophageal reflux disease)   . History of gout   . HTN (hypertension)   . Mixed hyperlipidemia   . OSA on CPAP    with 2L O2 at night  . Pancytopenia ([place])    a. related to chemo therapy for B cell lymphoma  . Peptic stricture of esophagus   . Severe aortic stenosis   . Spinal stenosis   . Type II diabetes mellitus ([place])     Past Surgical History:  Procedure Laterality Date  . APPENDECTOMY  ~ 1952  . BACK SURGERY    . CATARACT  EXTRACTION W/PHACO Left 11/06/2016   Procedure: CATARACT EXTRACTION PHACO AND INTRAOCULAR LENS PLACEMENT (IOC);  Surgeon: Estill Cotta, MD;  Location: [facility];  Service: Ophthalmology;  Laterality: Left;  Lot # [id number] H [place]: 01:09.4 AP%:25.2 CDE: 30.64  . CATARACT EXTRACTION W/PHACO Right 12/04/2016   Procedure: CATARACT EXTRACTION PHACO AND INTRAOCULAR LENS PLACEMENT (IOC);  Surgeon: Estill Cotta, MD;  Location: [facility];  Service: Ophthalmology;  Laterality: Right;  [place] 1:25.9 AP% 24.1 CDE 39.10 Fluid Pack lot # [id number]  . COLONOSCOPY W/ BIOPSIES AND POLYPECTOMY  2013  . CORONARY ANGIOPLASTY  1993  . CORONARY ANGIOPLASTY WITH STENT PLACEMENT  05/1997   "1"  . CORONARY ARTERY BYPASS GRAFT  03/2000   "CABG X5"  . ESOPHAGEAL DILATION  X 3-4   Dr. Lyla Son; "last one was in the 1990's"  . ESOPHAGOGASTRODUODENOSCOPY     multiple  . FLEXIBLE SIGMOIDOSCOPY     multiple  . HYDRADENITIS EXCISION Left 02/25/2017   Procedure: EXCISION DEEP LEFT AXILLARY LYMPH NODE;  Surgeon: Fanny Skates, MD;  Location: [place];  Service: General;  Laterality: Left;  . KNEE ARTHROSCOPY Left 2011   meniscus repair  . LEFT HEART CATHETERIZATION WITH CORONARY/GRAFT ANGIOGRAM N/A 03/16/2014   Procedure: LEFT HEART CATHETERIZATION WITH Beatrix Fetters;  Surgeon: Burnell Blanks,  MD;  Location: [place] [facility];  Service: Cardiovascular;  Laterality: N/A;  . LUMBAR LAMINECTOMY/DECOMPRESSION MICRODISCECTOMY Right 06/17/2013   Procedure: LUMBAR LAMINECTOMY MICRODISCECTOMY L4-L5 RIGHT EXCISION OF SYNOVIAL CYST RIGHT   (1 LEVEL) RIGHT PARTIAL FACETECTOMY;  Surgeon: Tobi Bastos, MD;  Location: [facility];  Service: Orthopedics;  Laterality: Right;  . MYELOGRAM  04/06/13   lumbar, Dr Gladstone Lighter  . PANENDOSCOPY    . PORTACATH PLACEMENT N/A 03/06/2017   Procedure: INSERTION PORT-A-CATH AND ASPIRATE SEROMA LEFT AXILLA;  Surgeon: Fanny Skates, MD;  Location: [place];  Service: General;  Laterality: N/A;  .  RIGHT/LEFT HEART CATH AND CORONARY/GRAFT ANGIOGRAPHY N/A 07/28/2017   Procedure: RIGHT/LEFT HEART CATH AND CORONARY/GRAFT ANGIOGRAPHY;  Surgeon: Sherren Mocha, MD;  Location: [place] [facility];  Service: Cardiovascular;  Laterality: N/A;  . SHOULDER SURGERY Right 08/2010   screws placed; "tendons tore off"  . SKIN CANCER EXCISION Right    "neck"  . TONSILLECTOMY  ~ 1954  . UPPER GI ENDOSCOPY  2013   Gastritis; Dr Carlean Purl  . VASECTOMY      Family History  Problem Relation Age of Onset  . Stroke Father   . Hypertension Father   . Pancreatic cancer Mother   . Diabetes Maternal Grandmother   . Stroke Maternal Grandmother   . Heart attack Paternal Grandmother   . Colon cancer Neg Hx   . Esophageal cancer Neg Hx   . Rectal cancer Neg Hx   . Stomach cancer Neg Hx   . Ulcers Neg Hx     Social History   Socioeconomic History  . Marital status: Divorced    Spouse name: Not on file  . Number of children: 2  . Years of education: Not on file  . Highest education level: Not on file  Social Needs  . Financial resource strain: Not on file  . Food insecurity - worry: Not on file  . Food insecurity - inability: Not on file  . Transportation needs - medical: Not on file  . Transportation needs - non-medical: Not on file  Occupational History    Employer: RETIRED  Tobacco Use  . Smoking status: Never Smoker  . Smokeless tobacco: Never Used  Substance and Sexual Activity  . Alcohol use: No    Alcohol/week: 0.0 oz    Comment: "last drink was in 2012"( 03/15/2014)  . Drug use: No  . Sexual activity: Not Currently  Other Topics Concern  . Not on file  Social History Narrative   Divorced, lives with a roommate.   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    Outpatient Medications Prior to Visit  Medication  Sig Dispense Refill  . allopurinol (ZYLOPRIM) 300 MG tablet Take 450 mg by mouth daily.     [name] amLODipine (NORVASC) 5 MG tablet TAKE 2 TABLETS (10 MG TOTAL) BY MOUTH DAILY. (Patient taking differently: TAKE 5 MG BY MOUTH TWICE DAILY.) 60 tablet 11  . amoxicillin-clavulanate (AUGMENTIN) 875-125 MG tablet Take 1 tablet by mouth 2 (two) times daily. 20 tablet 0  . aspirin 81 MG tablet Take 81 mg by mouth daily.    [name] atorvastatin (LIPITOR) 80 MG tablet TAKE ONE TABLET BY MOUTH AT BEDTIME 90 tablet 3  . diphenoxylate-atropine (LOMOTIL) 2.5-0.025 MG tablet Take 2 pills at the onset of diarrhea, then  take one pill after every loose stool. Maximum 8 pills per 24h. 60 tablet 0  . esomeprazole (NEXIUM) 40 MG capsule Take 1 capsule (40 mg total) by mouth daily. 30 capsule 5  . ezetimibe (ZETIA) 10 MG tablet TAKE ONE TABLET BY MOUTH DAILY 90 tablet 3  . famciclovir (FAMVIR) 500 MG tablet Take 1 tablet (500 mg total) by mouth daily. 30 tablet 12  . fluticasone (FLONASE) 50 MCG/ACT nasal spray INHALE ONE SPRAY INTO EACH NOSTRIL TWICE DAILY. 16 g 6  . furosemide (LASIX) 40 MG tablet Take 3 tablets (120 mg total) 2 (two) times daily by mouth. 180 tablet 5  . glimepiride (AMARYL) 2 MG tablet TAKE 1 TABLET (2 MG TOTAL) BY MOUTH DAILY WITH BREAKFAST. 90 tablet 0  . glucose 5 g chewable tablet Chew 15 g by mouth as needed for low blood sugar.    [name] glucose blood (FREESTYLE LITE) test strip TEST BLOOD SUGAR bid 100 each 11  . Lancets (FREESTYLE) lancets Use BID to check blood sugar.  DX E11.9 100 each 6  . lidocaine-prilocaine (EMLA) cream Apply to affected area once (Patient taking differently: Apply 1 application topically as needed (to port). ) 30 g 3  . lipase/protease/amylase (CREON) 36000 UNITS CPEP capsule Take 2 capsules (72,000 Units total) by mouth 3 (three) times daily with meals. 1 capsule with snack. 210 capsule 2  . LORazepam (ATIVAN) 0.5 MG tablet Take 1 tablet (0.5 mg total) by mouth every 6 (six) hours  as needed (Nausea or vomiting). 30 tablet 0  . Menthol, Topical Analgesic, (ICY HOT EX) Apply 1 application topically daily as needed (muscle pain).    . metFORMIN (GLUCOPHAGE) 1000 MG tablet TAKE ONE AND ONE-HALF TABLETS BY MOUTH EVERY MORNING AND 1 TALBET IN THE EVENING. 225 tablet 1  . metoprolol tartrate (LOPRESSOR) 25 MG tablet TAKE ONE-HALF TABLET BY MOUTH TWICE DAILY (Patient taking differently: TAKE ONE-HALF TABLET BY MOUTH IN THE MORNING AND HALF TABLET IN THE EVENING) 90 tablet 3  . nitroGLYCERIN (NITROSTAT) 0.4 MG SL tablet Place 1 tablet (0.4 mg total) under the tongue every 5 (five) minutes as needed for chest pain. 25 tablet 3  . ondansetron (ZOFRAN) 8 MG tablet Take 1 tablet (8 mg total) by mouth 2 (two) times daily as needed for refractory nausea / vomiting. Start on day 3 after cyclophosphamide chemotherapy. 30 tablet 1  . polycarbophil (FIBERCON) 625 MG tablet Take 625 mg by mouth daily.    . potassium chloride SA (KLOR-CON M20) 20 MEQ tablet Take 2 tablets (40 mEq total) 2 (two) times daily by mouth. 120 tablet 5  . predniSONE (DELTASONE) 20 MG tablet Take 3 pills a day, with food, for 5 days. Start the day after each chemotherapy 120 tablet 0  . pregabalin (LYRICA) 75 MG capsule Take 1 capsule (75 mg total) by mouth 3 (three) times daily. 270 capsule 3  . prochlorperazine (COMPAZINE) 10 MG tablet Take 1 tablet (10 mg total) by mouth every 6 (six) hours as needed (Nausea or vomiting). 30 tablet 6   Facility-Administered Medications Prior to Visit  Medication Dose Route Frequency Provider Last Rate Last Dose  . sodium chloride flush (NS) 0.9 % injection 10 mL  10 mL Intravenous PRN [name], MD   10 mL at 05/30/17 0920    Allergies  Allergen Reactions  . Benazepril Swelling    angioedema; he is not a candidate for any angiotensin receptor blockers because of this significant allergic reaction. Because  of a history of documented adverse serious drug reaction;Medi Alert  bracelet  is recommended  . Hctz [Hydrochlorothiazide] Anaphylaxis and Swelling    Tongue and lip swelling   . Aspirin Other (See Comments)    Gastritis, cant take 325 Mg aspirin   . Lactose Intolerance (Gi) Nausea And Vomiting    Review of Systems  Constitutional: Negative for chills, fever and malaise/fatigue.  HENT: Negative for congestion and hearing loss.   Eyes: Negative for discharge.  Respiratory: Positive for shortness of breath. Negative for cough and sputum production.   Cardiovascular: Negative for chest pain, palpitations and leg swelling.  Gastrointestinal: Negative for abdominal pain, blood in stool, constipation, diarrhea, heartburn, nausea and vomiting.  Genitourinary: Negative for dysuria, frequency, hematuria and urgency.  Musculoskeletal: Negative for back pain, falls and myalgias.  Skin: Negative for rash.  Neurological: Negative for dizziness, sensory change, loss of consciousness, weakness and headaches.  Endo/Heme/Allergies: Negative for environmental allergies. Does not bruise/bleed easily.  Psychiatric/Behavioral: Negative for depression and suicidal ideas. The patient is not nervous/anxious and does not have insomnia.        Objective:    Physical Exam  Constitutional: He is oriented to person, place, and time. Vital signs are normal. He appears well-developed and well-nourished. He is sleeping.  HENT:  Head: Normocephalic and atraumatic.  Mouth/Throat: Oropharynx is clear and moist.  Eyes: EOM are normal. Pupils are equal, round, and reactive to light.  Neck: Normal range of motion. Neck supple. No thyromegaly present.  Cardiovascular: Normal rate and regular rhythm.  Murmur heard. Pulmonary/Chest: Effort normal and breath sounds normal. No respiratory distress. He has no wheezes. He has no rales. He exhibits no tenderness.  Musculoskeletal: He exhibits no edema or tenderness.  Neurological: He is alert and oriented to person, place, and time.  Skin:  Skin is warm and dry.  Psychiatric: He has a normal mood and affect. His behavior is normal. Judgment and thought content normal.  Nursing note and vitals reviewed.   BP (!) 125/39 (BP Location: Left Arm, Patient Position: Sitting, Cuff Size: Large)   Pulse 87   Temp 97.8 F (36.6 C) (Oral)   Resp 16   Ht 6' 0.05" (1.83 m)   Wt 251 lb (113.9 kg)   SpO2 94%   BMI 34.00 kg/m  Wt Readings from Last 3 Encounters:  08/05/17 251 lb (113.9 kg)  08/01/17 252 lb 6.4 oz (114.5 kg)  07/29/17 245 lb 12.8 oz (111.5 kg)   BP Readings from Last 3 Encounters:  08/05/17 (!) 125/39  08/01/17 115/62  07/29/17 114/75     Immunization History  Administered Date(s) Administered  . Influenza Split 06/12/2011, 05/31/2013  . Influenza Whole 05/01/2012  . Influenza, High Dose Seasonal PF 02/09/2015, 03/12/2017  . Influenza-Unspecified 02/11/2014, 02/29/2016  . Pneumococcal Conjugate-13 09/18/2015  . Pneumococcal Polysaccharide-23 06/20/2013  . Td 03/20/2010, 03/16/2013  . Zoster Recombinat (Shingrix) 11/21/2016    Health Maintenance  Topic Date Due  . URINE MICROALBUMIN  03/15/2017  . HEMOGLOBIN A1C  08/21/2017  . OPHTHALMOLOGY EXAM  10/16/2017  . FOOT EXAM  11/21/2017  . COLONOSCOPY  02/19/2018  . TETANUS/TDAP  03/17/2023  . INFLUENZA VACCINE  Completed  . PNA vac Low Risk Adult  Completed    Lab Results  Component Value Date   WBC 3.5 08/01/2017   HGB 8.6 (L) 08/01/2017   HCT 26.7 (L) 08/01/2017   PLT 100 (LL) 08/01/2017   GLUCOSE 158 (H) 08/01/2017   CHOL 125 11/21/2016  TRIG 195.0 (H) 11/21/2016   HDL 29.20 (L) 11/21/2016   LDLDIRECT 71.0 09/18/2015   LDLCALC 57 11/21/2016   ALT 23 07/18/2017   AST 24 07/18/2017   NA 137 08/01/2017   K 4.2 08/01/2017   CL 101 08/01/2017   CREATININE 0.80 08/01/2017   BUN 12 08/01/2017   CO2 18 (L) 08/01/2017   TSH 2.53 07/28/2015   PSA 0.65 09/27/2013   INR 1.13 07/25/2017   HGBA1C 8.7 (H) 02/18/2017   MICROALBUR 0.4 03/15/2016     Lab Results  Component Value Date   TSH 2.53 07/28/2015   Lab Results  Component Value Date   WBC 3.5 08/01/2017   HGB 8.6 (L) 08/01/2017   HCT 26.7 (L) 08/01/2017   MCV 87 08/01/2017   PLT 100 (LL) 08/01/2017   Lab Results  Component Value Date   NA 137 08/01/2017   K 4.2 08/01/2017   CO2 18 (L) 08/01/2017   GLUCOSE 158 (H) 08/01/2017   BUN 12 08/01/2017   CREATININE 0.80 08/01/2017   BILITOT 0.8 07/18/2017   ALKPHOS 121 (H) 07/18/2017   AST 24 07/18/2017   ALT 23 07/18/2017   PROT 6.8 07/18/2017   ALBUMIN 4.0 07/18/2017   CALCIUM 7.3 (L) 08/01/2017   ANIONGAP 9 07/29/2017   GFR 81.37 02/18/2017   Lab Results  Component Value Date   CHOL 125 11/21/2016   Lab Results  Component Value Date   HDL 29.20 (L) 11/21/2016   Lab Results  Component Value Date   LDLCALC 57 11/21/2016   Lab Results  Component Value Date   TRIG 195.0 (H) 11/21/2016   Lab Results  Component Value Date   CHOLHDL 4 11/21/2016   Lab Results  Component Value Date   HGBA1C 8.7 (H) 02/18/2017         Assessment & Plan:   Problem List Items Addressed This Visit      Unprioritized   Aortic stenosis, severe    Pt expecting surgery soon  Waiting for clearance from oncology      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 ([place])    Per onc Last tx friday      DM (diabetes mellitus) type II uncontrolled, periph vascular disorder ([place]) - Primary    hgba1c to be done, minimize simple carbs. Increase exercise as tolerated. Continue current meds      Relevant Orders   Lipid panel   Hemoglobin A1c   Comprehensive metabolic panel   CBC with Differential/Platelet   Essential hypertension    Well controlled, no changes to meds. Encouraged heart healthy diet such as the DASH diet and exercise as tolerated.       Relevant Orders   Lipid panel   Hemoglobin A1c   Comprehensive metabolic panel   CBC with Differential/Platelet   Hyperlipidemia   Relevant Orders    Lipid panel   Hemoglobin A1c   Comprehensive metabolic panel   CBC with Differential/Platelet   Hyperlipidemia LDL goal <70    Tolerating statin, encouraged heart healthy diet, avoid trans fats, minimize simple carbs and saturated fats. Increase exercise as tolerated      NSTEMI (non-ST elevated myocardial infarction) [facility])    Per cardiology con't meds No further chest pain         I am having Kavan L. Muller maintain his allopurinol, esomeprazole, aspirin, nitroGLYCERIN, metoprolol tartrate, ezetimibe, amLODipine, atorvastatin, polycarbophil, (Menthol, Topical Analgesic, (ICY HOT EX)), metFORMIN, famciclovir, predniSONE, lidocaine-prilocaine, ondansetron, prochlorperazine, LORazepam, glucose, freestyle, glucose  blood, pregabalin, diphenoxylate-atropine, fluticasone, furosemide, potassium chloride SA, glimepiride, lipase/protease/amylase, and amoxicillin-clavulanate.  No orders of the defined types were placed in this encounter.   CMA served as Education administrator during this visit. History, Physical and Plan performed by medical provider. Documentation and orders reviewed and attested to.  [name], DO

## 2017-08-05 NOTE — Assessment & Plan Note (Signed)
Pt expecting surgery soon  Waiting for clearance from oncology

## 2017-08-05 NOTE — Assessment & Plan Note (Signed)
Tolerating statin, encouraged heart healthy diet, avoid trans fats, minimize simple carbs and saturated fats. Increase exercise as tolerated 

## 2017-08-05 NOTE — Assessment & Plan Note (Signed)
hgba1c to be done, minimize simple carbs. Increase exercise as tolerated. Continue current meds  

## 2017-08-05 NOTE — Assessment & Plan Note (Signed)
Per cardiology con't meds No further chest pain

## 2017-08-08 ENCOUNTER — Inpatient Hospital Stay: Payer: Medicare Other | Attending: Hematology & Oncology | Admitting: Hematology & Oncology

## 2017-08-08 ENCOUNTER — Encounter: Payer: Self-pay | Admitting: Hematology & Oncology

## 2017-08-08 ENCOUNTER — Inpatient Hospital Stay: Payer: Medicare Other

## 2017-08-08 ENCOUNTER — Other Ambulatory Visit: Payer: Self-pay

## 2017-08-08 VITALS — BP 111/43 | HR 45 | Temp 98.1°F | Resp 17

## 2017-08-08 DIAGNOSIS — I25118 Atherosclerotic heart disease of native coronary artery with other forms of angina pectoris: Secondary | ICD-10-CM | POA: Diagnosis not present

## 2017-08-08 DIAGNOSIS — Z5189 Encounter for other specified aftercare: Secondary | ICD-10-CM | POA: Insufficient documentation

## 2017-08-08 DIAGNOSIS — Z5111 Encounter for antineoplastic chemotherapy: Secondary | ICD-10-CM | POA: Insufficient documentation

## 2017-08-08 DIAGNOSIS — Z79899 Other long term (current) drug therapy: Secondary | ICD-10-CM | POA: Insufficient documentation

## 2017-08-08 DIAGNOSIS — C8332 Diffuse large B-cell lymphoma, intrathoracic lymph nodes: Secondary | ICD-10-CM

## 2017-08-08 DIAGNOSIS — Z5112 Encounter for antineoplastic immunotherapy: Secondary | ICD-10-CM | POA: Diagnosis not present

## 2017-08-08 DIAGNOSIS — D510 Vitamin B12 deficiency anemia due to intrinsic factor deficiency: Secondary | ICD-10-CM | POA: Diagnosis not present

## 2017-08-08 DIAGNOSIS — D508 Other iron deficiency anemias: Secondary | ICD-10-CM

## 2017-08-08 DIAGNOSIS — D519 Vitamin B12 deficiency anemia, unspecified: Secondary | ICD-10-CM

## 2017-08-08 DIAGNOSIS — C8330 Diffuse large B-cell lymphoma, unspecified site: Secondary | ICD-10-CM | POA: Insufficient documentation

## 2017-08-08 LAB — CMP (CANCER CENTER ONLY)
ALBUMIN: 3.6 g/dL (ref 3.5–5.0)
ALT: 27 U/L (ref 0–55)
AST: 28 U/L (ref 5–34)
Alkaline Phosphatase: 114 U/L — ABNORMAL HIGH (ref 26–84)
Anion gap: 11 (ref 5–15)
BILIRUBIN TOTAL: 0.8 mg/dL (ref 0.2–1.2)
BUN: 12 mg/dL (ref 7–22)
CALCIUM: 9.4 mg/dL (ref 8.0–10.3)
CHLORIDE: 102 mmol/L (ref 98–108)
CO2: 28 mmol/L (ref 18–33)
CREATININE: 1 mg/dL (ref 0.70–1.30)
GLUCOSE: 149 mg/dL — AB (ref 73–118)
Potassium: 3.3 mmol/L (ref 3.3–4.7)
SODIUM: 141 mmol/L (ref 128–145)
Total Protein: 6.9 g/dL (ref 6.4–8.1)

## 2017-08-08 LAB — CBC WITH DIFFERENTIAL (CANCER CENTER ONLY)
BASOS ABS: 0.1 10*3/uL (ref 0.0–0.1)
BASOS PCT: 1 %
EOS ABS: 0 10*3/uL (ref 0.0–0.5)
Eosinophils Relative: 0 %
HEMATOCRIT: 31.8 % — AB (ref 38.7–49.9)
Hemoglobin: 9.8 g/dL — ABNORMAL LOW (ref 13.0–17.1)
Lymphocytes Relative: 11 %
Lymphs Abs: 0.6 10*3/uL — ABNORMAL LOW (ref 0.9–3.3)
MCH: 28.2 pg (ref 28.0–33.4)
MCHC: 30.8 g/dL — ABNORMAL LOW (ref 32.0–35.9)
MCV: 91.4 fL (ref 82.0–98.0)
MONO ABS: 0.6 10*3/uL (ref 0.1–0.9)
Monocytes Relative: 12 %
NEUTROS PCT: 76 %
Neutro Abs: 3.9 10*3/uL (ref 1.5–6.5)
Platelet Count: 166 10*3/uL (ref 145–400)
RBC: 3.48 MIL/uL — ABNORMAL LOW (ref 4.20–5.70)
RDW: 19 % — AB (ref 11.1–15.7)
WBC Count: 5.1 10*3/uL (ref 4.0–10.0)

## 2017-08-08 LAB — LACTATE DEHYDROGENASE: LDH: 253 U/L — ABNORMAL HIGH (ref 125–245)

## 2017-08-08 MED ORDER — DIPHENHYDRAMINE HCL 25 MG PO CAPS
ORAL_CAPSULE | ORAL | Status: AC
  Filled 2017-08-08: qty 2

## 2017-08-08 MED ORDER — PEGFILGRASTIM 6 MG/0.6ML ~~LOC~~ PSKT
PREFILLED_SYRINGE | SUBCUTANEOUS | Status: AC
  Filled 2017-08-08: qty 0.6

## 2017-08-08 MED ORDER — ACETAMINOPHEN 325 MG PO TABS
ORAL_TABLET | ORAL | Status: AC
  Filled 2017-08-08: qty 2

## 2017-08-08 MED ORDER — CYANOCOBALAMIN 1000 MCG/ML IJ SOLN
INTRAMUSCULAR | Status: AC
  Filled 2017-08-08: qty 1

## 2017-08-08 MED ORDER — COLD PACK MISC ONCOLOGY
1.0000 | Freq: Once | Status: DC | PRN
  Filled 2017-08-08: qty 1

## 2017-08-08 MED ORDER — HEPARIN SOD (PORK) LOCK FLUSH 100 UNIT/ML IV SOLN
500.0000 [IU] | Freq: Once | INTRAVENOUS | Status: DC | PRN
  Filled 2017-08-08: qty 5

## 2017-08-08 MED ORDER — PALONOSETRON HCL INJECTION 0.25 MG/5ML
0.2500 mg | Freq: Once | INTRAVENOUS | Status: AC
  Administered 2017-08-08: 0.25 mg via INTRAVENOUS

## 2017-08-08 MED ORDER — DEXAMETHASONE SODIUM PHOSPHATE 10 MG/ML IJ SOLN
10.0000 mg | Freq: Once | INTRAMUSCULAR | Status: AC
  Administered 2017-08-08: 10 mg via INTRAVENOUS

## 2017-08-08 MED ORDER — ACETAMINOPHEN 325 MG PO TABS
650.0000 mg | ORAL_TABLET | Freq: Once | ORAL | Status: AC
  Administered 2017-08-08: 650 mg via ORAL

## 2017-08-08 MED ORDER — DEXAMETHASONE SODIUM PHOSPHATE 10 MG/ML IJ SOLN
INTRAMUSCULAR | Status: AC
  Filled 2017-08-08: qty 1

## 2017-08-08 MED ORDER — HOT PACK MISC ONCOLOGY
1.0000 | Freq: Once | Status: DC | PRN
  Filled 2017-08-08: qty 1

## 2017-08-08 MED ORDER — PALONOSETRON HCL INJECTION 0.25 MG/5ML
INTRAVENOUS | Status: AC
  Filled 2017-08-08: qty 5

## 2017-08-08 MED ORDER — CYANOCOBALAMIN 1000 MCG/ML IJ SOLN
1000.0000 ug | Freq: Once | INTRAMUSCULAR | Status: AC
  Administered 2017-08-08: 1000 ug via INTRAMUSCULAR

## 2017-08-08 MED ORDER — SODIUM CHLORIDE 0.9 % IV SOLN
750.0000 mg/m2 | Freq: Once | INTRAVENOUS | Status: AC
  Administered 2017-08-08: 1820 mg via INTRAVENOUS
  Filled 2017-08-08: qty 91

## 2017-08-08 MED ORDER — RITUXIMAB CHEMO INJECTION 500 MG/50ML
375.0000 mg/m2 | Freq: Once | INTRAVENOUS | Status: AC
  Administered 2017-08-08: 900 mg via INTRAVENOUS
  Filled 2017-08-08: qty 50

## 2017-08-08 MED ORDER — SODIUM CHLORIDE 0.9% FLUSH
10.0000 mL | INTRAVENOUS | Status: DC | PRN
  Administered 2017-08-08: 10 mL
  Filled 2017-08-08: qty 10

## 2017-08-08 MED ORDER — DENOSUMAB 120 MG/1.7ML ~~LOC~~ SOLN
120.0000 mg | Freq: Once | SUBCUTANEOUS | Status: AC
  Administered 2017-08-08: 120 mg via SUBCUTANEOUS

## 2017-08-08 MED ORDER — PEGFILGRASTIM 6 MG/0.6ML ~~LOC~~ PSKT
6.0000 mg | PREFILLED_SYRINGE | Freq: Once | SUBCUTANEOUS | Status: AC
  Administered 2017-08-08: 6 mg via SUBCUTANEOUS

## 2017-08-08 MED ORDER — DENOSUMAB 120 MG/1.7ML ~~LOC~~ SOLN
SUBCUTANEOUS | Status: AC
  Filled 2017-08-08: qty 1.7

## 2017-08-08 MED ORDER — VINCRISTINE SULFATE CHEMO INJECTION 1 MG/ML
2.0000 mg | Freq: Once | INTRAVENOUS | Status: AC
  Administered 2017-08-08: 2 mg via INTRAVENOUS
  Filled 2017-08-08: qty 2

## 2017-08-08 MED ORDER — SODIUM CHLORIDE 0.9 % IV SOLN
Freq: Once | INTRAVENOUS | Status: AC
  Administered 2017-08-08: 10:00:00 via INTRAVENOUS

## 2017-08-08 MED ORDER — DIPHENHYDRAMINE HCL 25 MG PO CAPS
50.0000 mg | ORAL_CAPSULE | Freq: Once | ORAL | Status: AC
  Administered 2017-08-08: 50 mg via ORAL

## 2017-08-08 MED ORDER — DOXORUBICIN HCL CHEMO IV INJECTION 2 MG/ML
50.0000 mg/m2 | Freq: Once | INTRAVENOUS | Status: AC
  Administered 2017-08-08: 122 mg via INTRAVENOUS
  Filled 2017-08-08: qty 61

## 2017-08-08 NOTE — Patient Instructions (Signed)
Implanted Port Insertion, Care After °This sheet gives you information about how to care for yourself after your procedure. Your health care provider may also give you more specific instructions. If you have problems or questions, contact your health care provider. °What can I expect after the procedure? °After your procedure, it is common to have: °· Discomfort at the port insertion site. °· Bruising on the skin over the port. This should improve over 3-4 days. ° °Follow these instructions at home: °Port care °· After your port is placed, you will get a manufacturer's information card. The card has information about your port. Keep this card with you at all times. °· Take care of the port as told by your health care provider. Ask your health care provider if you or a family member can get training for taking care of the port at home. A home health care nurse may also take care of the port. °· Make sure to remember what type of port you have. °Incision care °· Follow instructions from your health care provider about how to take care of your port insertion site. Make sure you: °? Wash your hands with soap and water before you change your bandage (dressing). If soap and water are not available, use hand sanitizer. °? Change your dressing as told by your health care provider. °? Leave stitches (sutures), skin glue, or adhesive strips in place. These skin closures may need to stay in place for 2 weeks or longer. If adhesive strip edges start to loosen and curl up, you may trim the loose edges. Do not remove adhesive strips completely unless your health care provider tells you to do that. °· Check your port insertion site every day for signs of infection. Check for: °? More redness, swelling, or pain. °? More fluid or blood. °? Warmth. °? Pus or a bad smell. °General instructions °· Do not take baths, swim, or use a hot tub until your health care provider approves. °· Do not lift anything that is heavier than 10 lb (4.5  kg) for a week, or as told by your health care provider. °· Ask your health care provider when it is okay to: °? Return to work or school. °? Resume usual physical activities or sports. °· Do not drive for 24 hours if you were given a medicine to help you relax (sedative). °· Take over-the-counter and prescription medicines only as told by your health care provider. °· Wear a medical alert bracelet in case of an emergency. This will tell any health care providers that you have a port. °· Keep all follow-up visits as told by your health care provider. This is important. °Contact a health care provider if: °· You cannot flush your port with saline as directed, or you cannot draw blood from the port. °· You have a fever or chills. °· You have more redness, swelling, or pain around your port insertion site. °· You have more fluid or blood coming from your port insertion site. °· Your port insertion site feels warm to the touch. °· You have pus or a bad smell coming from the port insertion site. °Get help right away if: °· You have chest pain or shortness of breath. °· You have bleeding from your port that you cannot control. °Summary °· Take care of the port as told by your health care provider. °· Change your dressing as told by your health care provider. °· Keep all follow-up visits as told by your health care provider. °  This information is not intended to replace advice given to you by your health care provider. Make sure you discuss any questions you have with your health care provider. °Document Released: 04/07/2013 Document Revised: 05/08/2016 Document Reviewed: 05/08/2016 °Elsevier Interactive Patient Education © 2017 Elsevier Inc. ° °

## 2017-08-08 NOTE — Patient Instructions (Signed)
Rituximab injection What is this medicine? RITUXIMAB (ri TUX i mab) is a monoclonal antibody. It is used to treat certain types of cancer like non-Hodgkin lymphoma and chronic lymphocytic leukemia. It is also used to treat rheumatoid arthritis, granulomatosis with polyangiitis (or Wegener's granulomatosis), and microscopic polyangiitis. This medicine may be used for other purposes; ask your health care provider or pharmacist if you have questions. COMMON BRAND NAME(S): Rituxan What should I tell my health care provider before I take this medicine? They need to know if you have any of these conditions: -heart disease -infection (especially a virus infection such as hepatitis B, chickenpox, cold sores, or herpes) -immune system problems -irregular heartbeat -kidney disease -lung or breathing disease, like asthma -recently received or scheduled to receive a vaccine -an unusual or allergic reaction to rituximab, mouse proteins, other medicines, foods, dyes, or preservatives -pregnant or trying to get pregnant -breast-feeding How should I use this medicine? This medicine is for infusion into a vein. It is administered in a hospital or clinic by a specially trained health care professional. A special MedGuide will be given to you by the pharmacist with each prescription and refill. Be sure to read this information carefully each time. Talk to your pediatrician regarding the use of this medicine in children. This medicine is not approved for use in children. Overdosage: If you think you have taken too much of this medicine contact a poison control center or emergency room at once. NOTE: This medicine is only for you. Do not share this medicine with others. What if I miss a dose? It is important not to miss a dose. Call your doctor or health care professional if you are unable to keep an appointment. What may interact with this medicine? -cisplatin -other medicines for arthritis like disease  modifying antirheumatic drugs or tumor necrosis factor inhibitors -live virus vaccines This list may not describe all possible interactions. Give your health care provider a list of all the medicines, herbs, non-prescription drugs, or dietary supplements you use. Also tell them if you smoke, drink alcohol, or use illegal drugs. Some items may interact with your medicine. What should I watch for while using this medicine? Your condition will be monitored carefully while you are receiving this medicine. You may need blood work done while you are taking this medicine. This medicine can cause serious allergic reactions. To reduce your risk you may need to take medicine before treatment with this medicine. Take your medicine as directed. In some patients, this medicine may cause a serious brain infection that may cause death. If you have any problems seeing, thinking, speaking, walking, or standing, tell your doctor right away. If you cannot reach your doctor, urgently seek other source of medical care. Call your doctor or health care professional for advice if you get a fever, chills or sore throat, or other symptoms of a cold or flu. Do not treat yourself. This drug decreases your body's ability to fight infections. Try to avoid being around people who are sick. Do not become pregnant while taking this medicine or for 12 months after stopping it. Women should inform their doctor if they wish to become pregnant or think they might be pregnant. There is a potential for serious side effects to an unborn child. Talk to your health care professional or pharmacist for more information. What side effects may I notice from receiving this medicine? Side effects that you should report to your doctor or health care professional as soon as possible: -breathing   problems -chest pain -dizziness or feeling faint -fast, irregular heartbeat -low blood counts - this medicine may decrease the number of white blood cells,  red blood cells and platelets. You may be at increased risk for infections and bleeding. -mouth sores -redness, blistering, peeling or loosening of the skin, including inside the mouth (this can be added for any serious or exfoliative rash that could lead to hospitalization) -signs of infection - fever or chills, cough, sore throat, pain or difficulty passing urine -signs and symptoms of kidney injury like trouble passing urine or change in the amount of urine -signs and symptoms of liver injury like dark yellow or brown urine; general ill feeling or flu-like symptoms; light-colored stools; loss of appetite; nausea; right upper belly pain; unusually weak or tired; yellowing of the eyes or skin -stomach pain -vomiting Side effects that usually do not require medical attention (report to your doctor or health care professional if they continue or are bothersome): -headache -joint pain -muscle cramps or muscle pain This list may not describe all possible side effects. Call your doctor for medical advice about side effects. You may report side effects to FDA at 1-800-FDA-1088. Where should I keep my medicine? This drug is given in a hospital or clinic and will not be stored at home. NOTE: This sheet is a summary. It may not cover all possible information. If you have questions about this medicine, talk to your doctor, pharmacist, or health care provider.  2018 Else[employer]/Gold Standard (2016-01-24 15:28:09) Cyclophosphamide injection What is this medicine? CYCLOPHOSPHAMIDE (sye kloe FOSS fa mide) is a chemotherapy drug. It slows the growth of cancer cells. This medicine is used to treat many types of cancer like lymphoma, myeloma, leukemia, breast cancer, and ovarian cancer, to name a few. This medicine may be used for other purposes; ask your health care provider or pharmacist if you have questions. COMMON BRAND NAME(S): Cytoxan, Neosar What should I tell my health care provider before I take this  medicine? They need to know if you have any of these conditions: -blood disorders -history of other chemotherapy -infection -kidney disease -liver disease -recent or ongoing radiation therapy -tumors in the bone marrow -an unusual or allergic reaction to cyclophosphamide, other chemotherapy, other medicines, foods, dyes, or preservatives -pregnant or trying to get pregnant -breast-feeding How should I use this medicine? This drug is usually given as an injection into a vein or muscle or by infusion into a vein. It is administered in a hospital or clinic by a specially trained health care professional. Talk to your pediatrician regarding the use of this medicine in children. Special care may be needed. Overdosage: If you think you have taken too much of this medicine contact a poison control center or emergency room at once. NOTE: This medicine is only for you. Do not share this medicine with others. What if I miss a dose? It is important not to miss your dose. Call your doctor or health care professional if you are unable to keep an appointment. What may interact with this medicine? This medicine may interact with the following medications: -amiodarone -amphotericin B -azathioprine -certain antiviral medicines for HIV or AIDS such as protease inhibitors (e.g., indinavir, ritonavir) and zidovudine -certain blood pressure medications such as benazepril, captopril, enalapril, fosinopril, lisinopril, moexipril, monopril, perindopril, quinapril, ramipril, trandolapril -certain cancer medications such as anthracyclines (e.g., daunorubicin, doxorubicin), busulfan, cytarabine, paclitaxel, pentostatin, tamoxifen, trastuzumab -certain diuretics such as chlorothiazide, chlorthalidone, hydrochlorothiazide, indapamide, metolazone -certain medicines that treat or prevent blood clots   like warfarin -certain muscle relaxants such as succinylcholine -cyclosporine -etanercept -indomethacin -medicines  to increase blood counts like filgrastim, pegfilgrastim, sargramostim -medicines used as general anesthesia -metronidazole -natalizumab This list may not describe all possible interactions. Give your health care provider a list of all the medicines, herbs, non-prescription drugs, or dietary supplements you use. Also tell them if you smoke, drink alcohol, or use illegal drugs. Some items may interact with your medicine. What should I watch for while using this medicine? Visit your doctor for checks on your progress. This drug may make you feel generally unwell. This is not uncommon, as chemotherapy can affect healthy cells as well as cancer cells. Report any side effects. Continue your course of treatment even though you feel ill unless your doctor tells you to stop. Drink water or other fluids as directed. Urinate often, even at night. In some cases, you may be given additional medicines to help with side effects. Follow all directions for their use. Call your doctor or health care professional for advice if you get a fever, chills or sore throat, or other symptoms of a cold or flu. Do not treat yourself. This drug decreases your body's ability to fight infections. Try to avoid being around people who are sick. This medicine may increase your risk to bruise or bleed. Call your doctor or health care professional if you notice any unusual bleeding. Be careful brushing and flossing your teeth or using a toothpick because you may get an infection or bleed more easily. If you have any dental work done, tell your dentist you are receiving this medicine. You may get drowsy or dizzy. Do not drive, use machinery, or do anything that needs mental alertness until you know how this medicine affects you. Do not become pregnant while taking this medicine or for 1 year after stopping it. Women should inform their doctor if they wish to become pregnant or think they might be pregnant. Men should not father a child  while taking this medicine and for 4 months after stopping it. There is a potential for serious side effects to an unborn child. Talk to your health care professional or pharmacist for more information. Do not breast-feed an infant while taking this medicine. This medicine may interfere with the ability to have a child. This medicine has caused ovarian failure in some women. This medicine has caused reduced sperm counts in some men. You should talk with your doctor or health care professional if you are concerned about your fertility. If you are going to have surgery, tell your doctor or health care professional that you have taken this medicine. What side effects may I notice from receiving this medicine? Side effects that you should report to your doctor or health care professional as soon as possible: -allergic reactions like skin rash, itching or hives, swelling of the face, lips, or tongue -low blood counts - this medicine may decrease the number of white blood cells, red blood cells and platelets. You may be at increased risk for infections and bleeding. -signs of infection - fever or chills, cough, sore throat, pain or difficulty passing urine -signs of decreased platelets or bleeding - bruising, pinpoint red spots on the skin, black, tarry stools, blood in the urine -signs of decreased red blood cells - unusually weak or tired, fainting spells, lightheadedness -breathing problems -dark urine -dizziness -palpitations -swelling of the ankles, feet, hands -trouble passing urine or change in the amount of urine -weight gain -yellowing of the eyes or skin   Side effects that usually do not require medical attention (report to your doctor or health care professional if they continue or are bothersome): -changes in nail or skin color -hair loss -missed menstrual periods -mouth sores -nausea, vomiting This list may not describe all possible side effects. Call your doctor for medical advice  about side effects. You may report side effects to FDA at 1-800-FDA-1088. Where should I keep my medicine? This drug is given in a hospital or clinic and will not be stored at home. NOTE: This sheet is a summary. It may not cover all possible information. If you have questions about this medicine, talk to your doctor, pharmacist, or health care provider.  2018 Elsevier/Gold Standard (2012-05-01 16:22:58) Vincristine injection What is this medicine? VINCRISTINE (vin KRIS teen) is a chemotherapy drug. It slows the growth of cancer cells. This medicine is used to treat many types of cancer like Hodgkin's disease, leukemia, non-Hodgkin's lymphoma, neuroblastoma (brain cancer), rhabdomyosarcoma, and Wilms' tumor. This medicine may be used for other purposes; ask your health care provider or pharmacist if you have questions. COMMON BRAND NAME(S): Oncovin, Vincasar PFS What should I tell my health care provider before I take this medicine? They need to know if you have any of these conditions: -blood disorders -gout -infection (especially chickenpox, cold sores, or herpes) -kidney disease -liver disease -lung disease -nervous system disease like Charcot-Marie-Tooth (CMT) -recent or ongoing radiation therapy -an unusual or allergic reaction to vincristine, other chemotherapy agents, other medicines, foods, dyes, or preservatives -pregnant or trying to get pregnant -breast-feeding How should I use this medicine? This drug is given as an infusion into a vein. It is administered in a hospital or clinic by a specially trained health care professional. If you have pain, swelling, burning, or any unusual feeling around the site of your injection, tell your health care professional right away. Talk to your pediatrician regarding the use of this medicine in children. While this drug may be prescribed for selected conditions, precautions do apply. Overdosage: If you think you have taken too much of this  medicine contact a poison control center or emergency room at once. NOTE: This medicine is only for you. Do not share this medicine with others. What if I miss a dose? It is important not to miss your dose. Call your doctor or health care professional if you are unable to keep an appointment. What may interact with this medicine? Do not take this medicine with any of the following medications: -itraconazole -mibefradil -voriconazole This medicine may also interact with the following medications: -cyclosporine -erythromycin -fluconazole -ketoconazole -medicines for HIV like delavirdine, efavirenz, nevirapine -medicines for seizures like ethotoin, fosphenotoin, phenytoin -medicines to increase blood counts like filgrastim, pegfilgrastim, sargramostim -other chemotherapy drugs like cisplatin, L-asparaginase, methotrexate, mitomycin, paclitaxel -pegaspargase -vaccines -zalcitabine, ddC Talk to your doctor or health care professional before taking any of these medicines: -acetaminophen -aspirin -ibuprofen -ketoprofen -naproxen This list may not describe all possible interactions. Give your health care provider a list of all the medicines, herbs, non-prescription drugs, or dietary supplements you use. Also tell them if you smoke, drink alcohol, or use illegal drugs. Some items may interact with your medicine. What should I watch for while using this medicine? Your condition will be monitored carefully while you are receiving this medicine. You will need important blood work done while you are taking this medicine. This drug may make you feel generally unwell. This is not uncommon, as chemotherapy can affect healthy cells as well as cancer   cells. Report any side effects. Continue your course of treatment even though you feel ill unless your doctor tells you to stop. In some cases, you may be given additional medicines to help with side effects. Follow all directions for their use. Call your  doctor or health care professional for advice if you get a fever, chills or sore throat, or other symptoms of a cold or flu. Do not treat yourself. Avoid taking products that contain aspirin, acetaminophen, ibuprofen, naproxen, or ketoprofen unless instructed by your doctor. These medicines may hide a fever. Do not become pregnant while taking this medicine. Women should inform their doctor if they wish to become pregnant or think they might be pregnant. There is a potential for serious side effects to an unborn child. Talk to your health care professional or pharmacist for more information. Do not breast-feed an infant while taking this medicine. Men may have a lower sperm count while taking this medicine. Talk to your doctor if you plan to father a child. What side effects may I notice from receiving this medicine? Side effects that you should report to your doctor or health care professional as soon as possible: -allergic reactions like skin rash, itching or hives, swelling of the face, lips, or tongue -breathing problems -confusion or changes in emotions or moods -constipation -cough -mouth sores -muscle weakness -nausea and vomiting -pain, swelling, redness or irritation at the injection site -pain, tingling, numbness in the hands or feet -problems with balance, talking, walking -seizures -stomach pain -trouble passing urine or change in the amount of urine Side effects that usually do not require medical attention (report to your doctor or health care professional if they continue or are bothersome): -diarrhea -hair loss -jaw pain -loss of appetite This list may not describe all possible side effects. Call your doctor for medical advice about side effects. You may report side effects to FDA at 1-800-FDA-1088. Where should I keep my medicine? This drug is given in a hospital or clinic and will not be stored at home. NOTE: This sheet is a summary. It may not cover all possible  information. If you have questions about this medicine, talk to your doctor, pharmacist, or health care provider.  2018 [employer]/Gold Standard (2008-03-14 17:17:13) Doxorubicin injection What is this medicine? DOXORUBICIN (dox oh ROO bi sin) is a chemotherapy drug. It is used to treat many kinds of cancer like leukemia, lymphoma, neuroblastoma, sarcoma, and Wilms' tumor. It is also used to treat bladder cancer, breast cancer, lung cancer, ovarian cancer, stomach cancer, and thyroid cancer. This medicine may be used for other purposes; ask your health care provider or pharmacist if you have questions. COMMON BRAND NAME(S): Adriamycin, Adriamycin PFS, Adriamycin RDF, Rubex What should I tell my health care provider before I take this medicine? They need to know if you have any of these conditions: -heart disease -history of low blood counts caused by a medicine -liver disease -recent or ongoing radiation therapy -an unusual or allergic reaction to doxorubicin, other chemotherapy agents, other medicines, foods, dyes, or preservatives -pregnant or trying to get pregnant -breast-feeding How should I use this medicine? This drug is given as an infusion into a vein. It is administered in a hospital or clinic by a specially trained health care professional. If you have pain, swelling, burning or any unusual feeling around the site of your injection, tell your health care professional right away. Talk to your pediatrician regarding the use of this medicine in children. Special care may be   needed. Overdosage: If you think you have taken too much of this medicine contact a poison control center or emergency room at once. NOTE: This medicine is only for you. Do not share this medicine with others. What if I miss a dose? It is important not to miss your dose. Call your doctor or health care professional if you are unable to keep an appointment. What may interact with this medicine? This medicine may  interact with the following medications: -6-mercaptopurine -paclitaxel -phenytoin -St. John's Wort -trastuzumab -verapamil This list may not describe all possible interactions. Give your health care provider a list of all the medicines, herbs, non-prescription drugs, or dietary supplements you use. Also tell them if you smoke, drink alcohol, or use illegal drugs. Some items may interact with your medicine. What should I watch for while using this medicine? This drug may make you feel generally unwell. This is not uncommon, as chemotherapy can affect healthy cells as well as cancer cells. Report any side effects. Continue your course of treatment even though you feel ill unless your doctor tells you to stop. There is a maximum amount of this medicine you should receive throughout your life. The amount depends on the medical condition being treated and your overall health. Your doctor will watch how much of this medicine you receive in your lifetime. Tell your doctor if you have taken this medicine before. You may need blood work done while you are taking this medicine. Your urine may turn red for a few days after your dose. This is not blood. If your urine is dark or brown, call your doctor. In some cases, you may be given additional medicines to help with side effects. Follow all directions for their use. Call your doctor or health care professional for advice if you get a fever, chills or sore throat, or other symptoms of a cold or flu. Do not treat yourself. This drug decreases your body's ability to fight infections. Try to avoid being around people who are sick. This medicine may increase your risk to bruise or bleed. Call your doctor or health care professional if you notice any unusual bleeding. Talk to your doctor about your risk of cancer. You may be more at risk for certain types of cancers if you take this medicine. Do not become pregnant while taking this medicine or for 6 months after  stopping it. Women should inform their doctor if they wish to become pregnant or think they might be pregnant. Men should not father a child while taking this medicine and for 6 months after stopping it. There is a potential for serious side effects to an unborn child. Talk to your health care professional or pharmacist for more information. Do not breast-feed an infant while taking this medicine. This medicine has caused ovarian failure in some women and reduced sperm counts in some men This medicine may interfere with the ability to have a child. Talk with your doctor or health care professional if you are concerned about your fertility. What side effects may I notice from receiving this medicine? Side effects that you should report to your doctor or health care professional as soon as possible: -allergic reactions like skin rash, itching or hives, swelling of the face, lips, or tongue -breathing problems -chest pain -fast or irregular heartbeat -low blood counts - this medicine may decrease the number of white blood cells, red blood cells and platelets. You may be at increased risk for infections and bleeding. -pain, redness, or irritation   at site where injected -signs of infection - fever or chills, cough, sore throat, pain or difficulty passing urine -signs of decreased platelets or bleeding - bruising, pinpoint red spots on the skin, black, tarry stools, blood in the urine -swelling of the ankles, feet, hands -tiredness -weakness Side effects that usually do not require medical attention (report to your doctor or health care professional if they continue or are bothersome): -diarrhea -hair loss -mouth sores -nail discoloration or damage -nausea -red colored urine -vomiting This list may not describe all possible side effects. Call your doctor for medical advice about side effects. You may report side effects to FDA at 1-800-FDA-1088. Where should I keep my medicine? This drug is given  in a hospital or clinic and will not be stored at home. NOTE: This sheet is a summary. It may not cover all possible information. If you have questions about this medicine, talk to your doctor, pharmacist, or health care provider.  2018 [employer]/Gold Standard (2015-08-14 11:28:51)  

## 2017-08-08 NOTE — Progress Notes (Signed)
Hematology and Oncology Follow Up Visit  [name] [id number] 08/19/1940 77 y.o. 08/08/2017   Principle Diagnosis:  Diffuse large cell non-Hodgkin's lymphoma (IPI = 3) - NOT "double hit" Pernicious anemia  Current Therapy:   R-CHOP - s/p cycle#7 Vitamin B12 1 mg IM every month Xgeva 120 mg subcu q 3 months - next dose in 10/2017   Interim History:  [name] is here today for follow-up and treatment.  Unfortunately, he has been having more issues with his aortic valve.  It sounds like he is going to need to have valve replacement surgery for his aortic stenosis.  He has been seen by cardiology.  He is missing by cardiac surgery.  They are awaiting [place] to give clearance for his surgery.  I told Mr. Devincenzi that surgery can be done after his last cycle of chemotherapy.  I want to make sure that his blood counts recover fully.  Otherwise, he has been doing quite well.  He really has had very little side effects from treatment.  He is eating well.  He has had no problems with nausea or vomiting.  He has had no diarrhea.  He has had no leg swelling.  He has had no rashes.  Overall, I said his performance status is ECOG 1.    Medications:  Allergies as of 08/08/2017      Reactions   Benazepril Swelling   angioedema; he is not a candidate for any angiotensin receptor blockers because of this significant allergic reaction. Because of a history of documented adverse serious drug reaction;Medi Alert bracelet  is recommended   Hctz [hydrochlorothiazide] Anaphylaxis, Swelling   Tongue and lip swelling   Aspirin Other (See Comments)   Gastritis, cant take 325 Mg aspirin    Lactose Intolerance (gi) Nausea And Vomiting      Medication List        Accurate as of 08/08/17  9:31 AM. Always use your most recent med list.          allopurinol 300 MG tablet Commonly known as:  ZYLOPRIM Take 450 mg by mouth daily.   amLODipine 5 MG tablet Commonly known as:  NORVASC TAKE 2 TABLETS (10  MG TOTAL) BY MOUTH DAILY.   amoxicillin-clavulanate 875-125 MG tablet Commonly known as:  AUGMENTIN Take 1 tablet by mouth 2 (two) times daily.   aspirin 81 MG tablet Take 81 mg by mouth daily.   atorvastatin 80 MG tablet Commonly known as:  LIPITOR TAKE ONE TABLET BY MOUTH AT BEDTIME   diphenoxylate-atropine 2.5-0.025 MG tablet Commonly known as:  LOMOTIL Take 2 pills at the onset of diarrhea, then take one pill after every loose stool. Maximum 8 pills per 24h.   esomeprazole 40 MG capsule Commonly known as:  NEXIUM Take 1 capsule (40 mg total) by mouth daily.   ezetimibe 10 MG tablet Commonly known as:  ZETIA TAKE ONE TABLET BY MOUTH DAILY   famciclovir 500 MG tablet Commonly known as:  FAMVIR Take 1 tablet (500 mg total) by mouth daily.   fluticasone 50 MCG/ACT nasal spray Commonly known as:  FLONASE INHALE ONE SPRAY INTO EACH NOSTRIL TWICE DAILY.   freestyle lancets Use BID to check blood sugar.  DX E11.9   furosemide 40 MG tablet Commonly known as:  LASIX Take 3 tablets (120 mg total) 2 (two) times daily by mouth.   glimepiride 2 MG tablet Commonly known as:  AMARYL TAKE 1 TABLET (2 MG TOTAL) BY MOUTH DAILY WITH BREAKFAST.  glucose 5 g chewable tablet Chew 15 g by mouth as needed for low blood sugar.   glucose blood test strip Commonly known as:  FREESTYLE LITE TEST BLOOD SUGAR bid   ICY HOT EX Apply 1 application topically daily as needed (muscle pain).   lidocaine-prilocaine cream Commonly known as:  EMLA Apply to affected area once   lipase/protease/amylase 36000 UNITS Cpep capsule Commonly known as:  CREON Take 2 capsules (72,000 Units total) by mouth 3 (three) times daily with meals. 1 capsule with snack.   LORazepam 0.5 MG tablet Commonly known as:  ATIVAN Take 1 tablet (0.5 mg total) by mouth every 6 (six) hours as needed (Nausea or vomiting).   metFORMIN 1000 MG tablet Commonly known as:  GLUCOPHAGE TAKE ONE AND ONE-HALF TABLETS BY  MOUTH EVERY MORNING AND 1 TALBET IN THE EVENING.   metoprolol tartrate 25 MG tablet Commonly known as:  LOPRESSOR TAKE ONE-HALF TABLET BY MOUTH TWICE DAILY   nitroGLYCERIN 0.4 MG SL tablet Commonly known as:  NITROSTAT Place 1 tablet (0.4 mg total) under the tongue every 5 (five) minutes as needed for chest pain.   ondansetron 8 MG tablet Commonly known as:  ZOFRAN Take 1 tablet (8 mg total) by mouth 2 (two) times daily as needed for refractory nausea / vomiting. Start on day 3 after cyclophosphamide chemotherapy.   polycarbophil 625 MG tablet Commonly known as:  FIBERCON Take 625 mg by mouth daily.   potassium chloride SA 20 MEQ tablet Commonly known as:  KLOR-CON M20 Take 2 tablets (40 mEq total) 2 (two) times daily by mouth.   predniSONE 20 MG tablet Commonly known as:  DELTASONE Take 3 pills a day, with food, for 5 days. Start the day after each chemotherapy   pregabalin 75 MG capsule Commonly known as:  LYRICA Take 1 capsule (75 mg total) by mouth 3 (three) times daily.   prochlorperazine 10 MG tablet Commonly known as:  COMPAZINE Take 1 tablet (10 mg total) by mouth every 6 (six) hours as needed (Nausea or vomiting).       Allergies:  Allergies  Allergen Reactions  . Benazepril Swelling    angioedema; he is not a candidate for any angiotensin receptor blockers because of this significant allergic reaction. Because of a history of documented adverse serious drug reaction;Medi Alert bracelet  is recommended  . Hctz [Hydrochlorothiazide] Anaphylaxis and Swelling    Tongue and lip swelling   . Aspirin Other (See Comments)    Gastritis, cant take 325 Mg aspirin   . Lactose Intolerance (Gi) Nausea And Vomiting    Past Medical History, Surgical history, Social history, and Family History were reviewed and updated.  Review of Systems: Review of Systems  Constitutional: Negative.   HENT: Positive for congestion.   Eyes: Negative.   Respiratory: Negative.     Cardiovascular: Negative.   Gastrointestinal: Negative.   Genitourinary: Negative.   Musculoskeletal: Negative.   Skin: Negative.   Neurological: Negative.   Endo/Heme/Allergies: Negative.      Physical Exam:  vitals were not taken for this visit.   Wt Readings from Last 3 Encounters:  08/05/17 251 lb (113.9 kg)  08/01/17 252 lb 6.4 oz (114.5 kg)  07/29/17 245 lb 12.8 oz (111.5 kg)    Physical Exam  Constitutional: He is oriented to person, place, and time.  HENT:  Head: Normocephalic and atraumatic.  Mouth/Throat: Oropharynx is clear and moist.  Eyes: EOM are normal. Pupils are equal, round, and reactive to light.  Neck: Normal range of motion.  Cardiovascular: Normal rate, regular rhythm and normal heart sounds.  Pulmonary/Chest: Effort normal and breath sounds normal.  Abdominal: Soft. Bowel sounds are normal.  Musculoskeletal: Normal range of motion. He exhibits no edema, tenderness or deformity.  Lymphadenopathy:    He has no cervical adenopathy.  Neurological: He is alert and oriented to person, place, and time.  Skin: Skin is warm and dry. No rash noted. No erythema.  Psychiatric: He has a normal mood and affect. His behavior is normal. Judgment and thought content normal.  Vitals reviewed.   Lab Results  Component Value Date   WBC 5.1 08/08/2017   HGB 9.8 (L) 08/05/2017   HCT 31.8 (L) 08/08/2017   MCV 91.4 08/08/2017   PLT 166 08/08/2017   Lab Results  Component Value Date   FERRITIN 557 (H) 02/12/2017   IRON 54 02/12/2017   TIBC 306 02/12/2017   UIBC 252 02/12/2017   IRONPCTSAT 18 (L) 02/12/2017   Lab Results  Component Value Date   RETICCTPCT 1.1 11/22/2011   RBC 3.48 (L) 08/08/2017   RETICCTABS 52.1 11/22/2011   No results found for: Nils Pyle [facility] Lab Results  Component Value Date   IGA 159 03/09/2012   No results found for: Odetta Pink, SPEI   Chemistry       Component Value Date/Time   NA 141 08/05/2017 1150   NA 137 08/01/2017 0805   NA 144 06/27/2017 0857   K 3.7 08/05/2017 1150   K 3.9 06/27/2017 0857   CL 101 08/05/2017 1150   CL 103 06/27/2017 0857   CO2 29 08/05/2017 1150   CO2 26 06/27/2017 0857   BUN 11 08/05/2017 1150   BUN 12 08/01/2017 0805   BUN 12 06/27/2017 0857   CREATININE 0.88 08/05/2017 1150   CREATININE 1.00 07/18/2017 0846   CREATININE 1.0 06/27/2017 0857      Component Value Date/Time   CALCIUM 9.6 08/05/2017 1150   CALCIUM 9.2 06/27/2017 0857   ALKPHOS 105 08/05/2017 1150   ALKPHOS 128 (H) 06/27/2017 0857   AST 17 08/05/2017 1150   AST 24 07/18/2017 0846   ALT 17 08/05/2017 1150   ALT 23 07/18/2017 0846   ALT 24 06/27/2017 0857   BILITOT 0.6 08/05/2017 1150   BILITOT 0.8 07/18/2017 0846      Impression and Plan: Mr. Pullin is a very pleasant 77 yo caucasian gentleman with diffuse large B-cell lymphoma (not double hit lymphoma).   Today will be his eighth and final cycle of treatment.  Afterwards, we will get a PET scan on him.  I will set this up for 1 month.  Once his blood counts recover, which should take 3-4 weeks, then I think he will be in good shape for his aortic valve surgery.  He will get his Delton See today.  We will do Xgeva every 3 months.  I would have to believe that that he has a very good chance of staying in remission.  I would have to believe that his prognosis and mortality will be based on his aortic valve status and not his lymphoma.  I spent about 30 minutes with him today.  I spent over 50% of the time face-to-face talking to him about his aortic valve situation and how we need to follow-up with his lymphoma.       [name], MD 2/8/20199:31 AM

## 2017-08-12 ENCOUNTER — Ambulatory Visit (HOSPITAL_COMMUNITY)
Admission: RE | Admit: 2017-08-12 | Discharge: 2017-08-12 | Disposition: A | Discharge status: Home / Self Care | Payer: Medicare Other | Source: Ambulatory Visit | Attending: Physician Assistant | Admitting: Physician Assistant

## 2017-08-12 ENCOUNTER — Encounter: Payer: Self-pay | Admitting: Physical Therapy

## 2017-08-12 ENCOUNTER — Institutional Professional Consult (permissible substitution) (INDEPENDENT_AMBULATORY_CARE_PROVIDER_SITE_OTHER): Payer: Medicare Other | Admitting: Thoracic Surgery (Cardiothoracic Vascular Surgery)

## 2017-08-12 ENCOUNTER — Other Ambulatory Visit: Payer: Self-pay

## 2017-08-12 ENCOUNTER — Encounter: Payer: Self-pay | Admitting: Thoracic Surgery (Cardiothoracic Vascular Surgery)

## 2017-08-12 ENCOUNTER — Ambulatory Visit: Payer: Medicare Other | Attending: Cardiovascular Disease | Admitting: Physical Therapy

## 2017-08-12 VITALS — BP 124/67 | HR 88 | Resp 18 | Ht 72.0 in | Wt 252.0 lb

## 2017-08-12 DIAGNOSIS — R293 Abnormal posture: Secondary | ICD-10-CM | POA: Insufficient documentation

## 2017-08-12 DIAGNOSIS — I25118 Atherosclerotic heart disease of native coronary artery with other forms of angina pectoris: Secondary | ICD-10-CM

## 2017-08-12 DIAGNOSIS — R2689 Other abnormalities of gait and mobility: Secondary | ICD-10-CM | POA: Diagnosis not present

## 2017-08-12 DIAGNOSIS — I350 Nonrheumatic aortic (valve) stenosis: Secondary | ICD-10-CM | POA: Insufficient documentation

## 2017-08-12 DIAGNOSIS — J988 Other specified respiratory disorders: Secondary | ICD-10-CM | POA: Diagnosis not present

## 2017-08-12 DIAGNOSIS — M6281 Muscle weakness (generalized): Secondary | ICD-10-CM | POA: Diagnosis not present

## 2017-08-12 LAB — PULMONARY FUNCTION TEST
DL/VA % PRED: 68 %
DL/VA: 3.24 ml/min/mmHg/L
DLCO COR: 20.56 ml/min/mmHg
DLCO cor % pred: 58 %
DLCO unc % pred: 48 %
DLCO unc: 17.11 ml/min/mmHg
FEF 25-75 Post: 2.71 L/sec
FEF 25-75 Pre: 2.04 L/sec
FEF2575-%CHANGE-POST: 32 %
FEF2575-%PRED-POST: 122 %
FEF2575-%Pred-Pre: 92 %
FEV1-%CHANGE-POST: 4 %
FEV1-%PRED-PRE: 88 %
FEV1-%Pred-Post: 92 %
FEV1-Post: 2.93 L
FEV1-Pre: 2.79 L
FEV1FVC-%CHANGE-POST: 8 %
FEV1FVC-%Pred-Pre: 103 %
FEV6-%Change-Post: -3 %
FEV6-%PRED-PRE: 90 %
FEV6-%Pred-Post: 87 %
FEV6-PRE: 3.75 L
FEV6-Post: 3.62 L
FEV6FVC-%Change-Post: 0 %
FEV6FVC-%PRED-PRE: 106 %
FEV6FVC-%Pred-Post: 106 %
FVC-%Change-Post: -3 %
FVC-%PRED-POST: 82 %
FVC-%PRED-PRE: 85 %
FVC-POST: 3.63 L
FVC-PRE: 3.75 L
POST FEV1/FVC RATIO: 81 %
PRE FEV6/FVC RATIO: 100 %
Post FEV6/FVC ratio: 100 %
Pre FEV1/FVC ratio: 74 %
RV % pred: 87 %
RV: 2.42 L
TLC % pred: 83 %
TLC: 6.2 L

## 2017-08-12 MED ORDER — ALBUTEROL SULFATE (2.5 MG/3ML) 0.083% IN NEBU
2.5000 mg | INHALATION_SOLUTION | Freq: Once | RESPIRATORY_TRACT | Status: AC
  Administered 2017-08-12: 2.5 mg via RESPIRATORY_TRACT

## 2017-08-12 NOTE — Progress Notes (Signed)
[facility]  MULTIDISCIPLINARY HEART VALVE CLINIC  CARDIOTHORACIC SURGERY CONSULTATION REPORT  Referring Provider is Minus Breeding, MD PCP is Carollee Herter, Alferd Apa, DO  Chief Complaint  Patient presents with  . Aortic Stenosis    2nd TAVR Consultation    HPI:  Patient is a 77 year old morbidly obese male with history of aortic stenosis, coronary artery disease status post coronary artery bypass grafting x5 in 2001, hypertension, hyperlipidemia, type 2 diabetes mellitus, obstructive sleep apnea on CPAP, and recently diagnosed large B-cell lymphoma for which the patient is currently undergoing chemotherapy who has been referred for second surgical opinion to discuss treatment options for management of severe symptomatic aortic stenosis.  Patient states that he has been told of the presence of a heart murmur for many years.  His cardiac history otherwise dates back to 2001 when he underwent coronary artery bypass grafting x5 by Dr. Cyndia Bent.  He has clinically done well from a cardiac standpoint until more recently.  He has been followed by Dr. Percival Spanish and developed aortic stenosis that has progressed in severity over the last several years.  In 2015 echocardiogram revealed moderate aortic stenosis with mean transvalvular gradient estimated 14 mmHg.  Left ventricular systolic function was normal at that time.  Repeat echocardiogram in July 2018 revealed significant progression of disease with mean transvalvular gradient estimated 40 mmHg.  Prior to that he suffered a syncopal episode and Holter monitor revealed sinus rhythm with occasional 2-1 AV block and resting heart rate in the 30s.  Shortly after that he was diagnosed with diffuse large B cell lymphoma.  He was started on chemotherapy in September 2018.  Over the last several months he has developed progressive exertional shortness of breath.  Follow-up echocardiogram performed May 16, 2017 revealed further progression in the  severity of aortic stenosis with peak velocity reported 4.9 m/s corresponding to mean transvalvular gradient estimated 54 mmHg.  The DVI was 0.22 and aortic valve area estimated 0.84 cm.  Left ventricular systolic function remain normal with ejection fraction estimated 55-60%.  He completed his chemotherapy on July 18, 2017.  Shortly after that his symptoms got much worse and he was hospitalized briefly with acute exacerbation of chronic diastolic congestive heart failure exacerbated by the presence of pancytopenia which developed secondary to his chemotherapy.  He was treated medically and evaluated by the multidisciplinary heart valve team for management of his aortic stenosis.  Left and right heart catheterization was performed by Dr. Burt Knack on July 28, 2017.  He was found to have severe native three-vessel coronary artery disease with total occlusion of the left anterior descending coronary artery, total occlusion of the ramus intermedius branch, total occlusion of the first obtuse marginal branch of the left circumflex coronary artery and total occlusion of the right coronary artery.  All but 1 of the patient's previous bypass grafts remain patent with continued patency of the left internal mammary artery, the right internal mammary artery, and sequential saphenous vein graft to the intermediate and first obtuse marginal branches of the left circumflex.  Vein graft to the diagonal branch was chronically occluded.  Catheterization also confirmed the presence of severe aortic stenosis with mean transvalvular gradient measured 51 mmHg.  Pulmonary artery pressures were essentially normal.  The patient underwent CT angiography and was evaluated by Dr. Cyndia Bent while he was in the hospital.  He was discharged home with plans for follow-up PET CT scan the first week of March.  The patient will follow up with  Dr. Marin Olp after that and hopes to be ready for definitive management of his aortic stenosis shortly  thereafter.  The patient is single and lives alone in [place].  Recently he has been staying with his daughter who also lives nearby.  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  He enjoys playing golf and plays nearly every day, although he has not been able to play for nearly 6 months since he has been undergoing treatment for his cancer.  He describes stable symptoms of exertional shortness of breath.  He gets short of breath with moderate and low-level activity.  He has not had resting shortness of breath, PND, orthopnea since hospital discharge.  He gets chest tightness with exertion that coincides with shortness of breath.  He has not had any resting chest pain.  He denies any recent history of dizzy spells or syncope.  His mobility is also limited to some degree because of degenerative arthritis which reflects both hips and his left knee.  However, he remains ambulatory and does not require a cane or other type of mechanical assistance.  Past Medical History:  Diagnosis Date  . Anemia   . Axillary adenopathy 02/25/2017  . Bradycardia    a. holter monitor has demonstrated HRs in 30s and [place]   . CAD (coronary artery disease)    a. s/p CABG 2001  . Chronic lower back pain   . Diffuse large B cell lymphoma ([place])   . Diverticulosis   . Esophageal stricture   . GERD (gastroesophageal reflux disease)   . History of gout   . HTN (hypertension)   . Mixed hyperlipidemia   . OSA on CPAP    with 2L O2 at night  . Pancytopenia ([place])    a. related to chemo therapy for B cell lymphoma  . Peptic stricture of esophagus   . Severe aortic stenosis   . Spinal stenosis   . Type II diabetes mellitus ([place])     Past Surgical History:  Procedure Laterality Date  . APPENDECTOMY  ~ 1952  . BACK SURGERY    . CATARACT EXTRACTION W/PHACO Left 11/06/2016   Procedure: CATARACT EXTRACTION PHACO AND INTRAOCULAR LENS PLACEMENT (IOC);  Surgeon: Estill Cotta, MD;  Location: [facility];  Service: Ophthalmology;  Laterality: Left;  Lot # [id number] H [place]: 01:09.4 AP%:25.2 CDE: 30.64  . CATARACT EXTRACTION W/PHACO Right 12/04/2016   Procedure: CATARACT EXTRACTION PHACO AND INTRAOCULAR LENS PLACEMENT (IOC);  Surgeon: Estill Cotta, MD;  Location: [facility];  Service: Ophthalmology;  Laterality: Right;  [place] 1:25.9 AP% 24.1 CDE 39.10 Fluid Pack lot # [id number]  . COLONOSCOPY W/ BIOPSIES AND POLYPECTOMY  2013  . CORONARY ANGIOPLASTY  1993  . CORONARY ANGIOPLASTY WITH STENT PLACEMENT  05/1997   "1"  . CORONARY ARTERY BYPASS GRAFT  03/2000   "CABG X5"  . ESOPHAGEAL DILATION  X 3-4   Dr. Lyla Son; "last one was in the 1990's"  . ESOPHAGOGASTRODUODENOSCOPY     multiple  . FLEXIBLE SIGMOIDOSCOPY     multiple  . HYDRADENITIS EXCISION Left 02/25/2017   Procedure: EXCISION DEEP LEFT AXILLARY LYMPH NODE;  Surgeon: Fanny Skates, MD;  Location: [facility];  Service: General;  Laterality: Left;  . KNEE ARTHROSCOPY Left 2011   meniscus repair  . LEFT HEART CATHETERIZATION WITH CORONARY/GRAFT ANGIOGRAM N/A 03/16/2014   Procedure: LEFT HEART CATHETERIZATION WITH Beatrix Fetters;  Surgeon: Burnell Blanks, MD;  Location: [facility];  Service: Cardiovascular;  Laterality: N/A;  . LUMBAR LAMINECTOMY/DECOMPRESSION MICRODISCECTOMY Right 06/17/2013   Procedure: LUMBAR LAMINECTOMY MICRODISCECTOMY L4-L5 RIGHT EXCISION OF SYNOVIAL CYST RIGHT   (1 LEVEL) RIGHT PARTIAL FACETECTOMY;  Surgeon: Tobi Bastos, MD;  Location: [facility];  Service: Orthopedics;  Laterality: Right;  . MYELOGRAM  04/06/13   lumbar, Dr Gladstone Lighter  . PANENDOSCOPY    . PORTACATH PLACEMENT N/A 03/06/2017   Procedure: INSERTION PORT-A-CATH AND ASPIRATE SEROMA LEFT AXILLA;  Surgeon: Fanny Skates, MD;  Location: [facility];  Service: General;  Laterality: N/A;  . RIGHT/LEFT HEART CATH AND CORONARY/GRAFT ANGIOGRAPHY N/A 07/28/2017   Procedure: RIGHT/LEFT HEART CATH AND CORONARY/GRAFT  ANGIOGRAPHY;  Surgeon: Sherren Mocha, MD;  Location: [facility];  Service: Cardiovascular;  Laterality: N/A;  . SHOULDER SURGERY Right 08/2010   screws placed; "tendons tore off"  . SKIN CANCER EXCISION Right    "neck"  . TONSILLECTOMY  ~ 1954  . UPPER GI ENDOSCOPY  2013   Gastritis; Dr Carlean Purl  . VASECTOMY      Family History  Problem Relation Age of Onset  . Stroke Father   . Hypertension Father   . Pancreatic cancer Mother   . Diabetes Maternal Grandmother   . Stroke Maternal Grandmother   . Heart attack Paternal Grandmother   . Colon cancer Neg Hx   . Esophageal cancer Neg Hx   . Rectal cancer Neg Hx   . Stomach cancer Neg Hx   . Ulcers Neg Hx     Social History   Socioeconomic History  . Marital status: Divorced    Spouse name: Not on file  . Number of children: 2  . Years of education: Not on file  . Highest education level: Not on file  Social Needs  . Financial resource strain: Not on file  . Food insecurity - worry: Not on file  . Food insecurity - inability: Not on file  . Transportation needs - medical: Not on file  . Transportation needs - non-medical: Not on file  Occupational History    Employer: RETIRED  Tobacco Use  . Smoking status: Never Smoker  . Smokeless tobacco: Never Used  Substance and Sexual Activity  . Alcohol use: No    Alcohol/week: 0.0 oz    Comment: "last drink was in 2012"( 03/15/2014)  . Drug use: No  . Sexual activity: Not Currently  Other Topics Concern  . Not on file  Social History Narrative   Divorced, lives with a roommate.   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    Current Outpatient Medications  Medication Sig Dispense Refill  . allopurinol (ZYLOPRIM) 300 MG tablet Take 450 mg by mouth daily.     [name] amLODipine (NORVASC) 5 MG tablet  TAKE 2 TABLETS (10 MG TOTAL) BY MOUTH DAILY. (Patient taking differently: TAKE 5 MG BY MOUTH TWICE DAILY.) 60 tablet 11  . amoxicillin-clavulanate (AUGMENTIN) 875-125 MG tablet Take 1 tablet by mouth 2 (two) times daily. 20 tablet 0  . aspirin 81 MG tablet Take 81 mg by mouth daily.    [name] atorvastatin (LIPITOR) 80 MG tablet TAKE ONE TABLET BY MOUTH AT BEDTIME 90 tablet 3  . diphenoxylate-atropine (LOMOTIL) 2.5-0.025 MG tablet Take 2 pills at the onset of diarrhea, then take one pill after every loose stool. Maximum 8 pills per 24h.  60 tablet 0  . esomeprazole (NEXIUM) 40 MG capsule Take 1 capsule (40 mg total) by mouth daily. 30 capsule 5  . ezetimibe (ZETIA) 10 MG tablet TAKE ONE TABLET BY MOUTH DAILY 90 tablet 3  . famciclovir (FAMVIR) 500 MG tablet Take 1 tablet (500 mg total) by mouth daily. 30 tablet 12  . fluticasone (FLONASE) 50 MCG/ACT nasal spray INHALE ONE SPRAY INTO EACH NOSTRIL TWICE DAILY. 16 g 6  . furosemide (LASIX) 40 MG tablet Take 3 tablets (120 mg total) 2 (two) times daily by mouth. 180 tablet 5  . glimepiride (AMARYL) 2 MG tablet TAKE 1 TABLET (2 MG TOTAL) BY MOUTH DAILY WITH BREAKFAST. 90 tablet 0  . glucose 5 g chewable tablet Chew 15 g by mouth as needed for low blood sugar.    [name] glucose blood (FREESTYLE LITE) test strip TEST BLOOD SUGAR bid 100 each 11  . Lancets (FREESTYLE) lancets Use BID to check blood sugar.  DX E11.9 100 each 6  . lidocaine-prilocaine (EMLA) cream Apply to affected area once (Patient taking differently: Apply 1 application topically as needed (to port). ) 30 g 3  . lipase/protease/amylase (CREON) 36000 UNITS CPEP capsule Take 2 capsules (72,000 Units total) by mouth 3 (three) times daily with meals. 1 capsule with snack. 210 capsule 2  . LORazepam (ATIVAN) 0.5 MG tablet Take 1 tablet (0.5 mg total) by mouth every 6 (six) hours as needed (Nausea or vomiting). 30 tablet 0  . Menthol, Topical Analgesic, (ICY HOT EX) Apply 1 application topically daily as  needed (muscle pain).    . metFORMIN (GLUCOPHAGE) 1000 MG tablet TAKE ONE AND ONE-HALF TABLETS BY MOUTH EVERY MORNING AND 1 TALBET IN THE EVENING. 225 tablet 1  . metoprolol tartrate (LOPRESSOR) 25 MG tablet TAKE ONE-HALF TABLET BY MOUTH TWICE DAILY (Patient taking differently: TAKE ONE-HALF TABLET BY MOUTH IN THE MORNING AND HALF TABLET IN THE EVENING) 90 tablet 3  . nitroGLYCERIN (NITROSTAT) 0.4 MG SL tablet Place 1 tablet (0.4 mg total) under the tongue every 5 (five) minutes as needed for chest pain. 25 tablet 3  . ondansetron (ZOFRAN) 8 MG tablet Take 1 tablet (8 mg total) by mouth 2 (two) times daily as needed for refractory nausea / vomiting. Start on day 3 after cyclophosphamide chemotherapy. 30 tablet 1  . polycarbophil (FIBERCON) 625 MG tablet Take 625 mg by mouth daily.    . potassium chloride SA (KLOR-CON M20) 20 MEQ tablet Take 2 tablets (40 mEq total) 2 (two) times daily by mouth. 120 tablet 5  . predniSONE (DELTASONE) 20 MG tablet Take 3 pills a day, with food, for 5 days. Start the day after each chemotherapy 120 tablet 0  . pregabalin (LYRICA) 75 MG capsule Take 1 capsule (75 mg total) by mouth 3 (three) times daily. 270 capsule 3  . prochlorperazine (COMPAZINE) 10 MG tablet Take 1 tablet (10 mg total) by mouth every 6 (six) hours as needed (Nausea or vomiting). 30 tablet 6   No current facility-administered medications for this visit.    Facility-Administered Medications Ordered in Other Visits  Medication Dose Route Frequency Provider Last Rate Last Dose  . sodium chloride flush (NS) 0.9 % injection 10 mL  10 mL Intravenous PRN [name], MD   10 mL at 05/30/17 0920    Allergies  Allergen Reactions  . Benazepril Swelling    angioedema; he is not a candidate for any angiotensin receptor blockers because of this significant allergic reaction. Because of  a history of documented adverse serious drug reaction;Medi Alert bracelet  is recommended  . Hctz  [Hydrochlorothiazide] Anaphylaxis and Swelling    Tongue and lip swelling   . Aspirin Other (See Comments)    Gastritis, cant take 325 Mg aspirin   . Lactose Intolerance (Gi) Nausea And Vomiting      Review of Systems:   General:  normal appetite, decreased energy, no weight gain, no weight loss, no fever  Cardiac:  + chest pain with exertion, no chest pain at rest, + SOB with exertion, no resting SOB, no PND, no orthopnea, no palpitations, no arrhythmia, no atrial fibrillation, + LE edema, no dizzy spells, no syncope  Respiratory:  + shortness of breath, no home oxygen, no productive cough, no dry cough, no bronchitis, no wheezing, no hemoptysis, no asthma, no pain with inspiration or cough, + sleep apnea, + CPAP at night  GI:   no difficulty swallowing, + reflux, + frequent heartburn, + hiatal hernia, no abdominal pain, + constipation, + diarrhea, no hematochezia, no hematemesis, no melena  GU:   no dysuria,  + frequency, no urinary tract infection, no hematuria, no enlarged prostate, no kidney stones, no kidney disease  Vascular:  no pain suggestive of claudication, + pain in feet, + leg cramps, no varicose veins, no DVT, no non-healing foot ulcer  Neuro:   no stroke, no TIA's, no seizures, no headaches, no temporary blindness one eye,  no slurred speech, + peripheral neuropathy, no chronic pain, mild instability of gait, no memory/cognitive dysfunction  Musculoskeletal: + arthritis, no joint swelling, no myalgias, + difficulty walking, mildly decreased mobility   Skin:   no rash, no itching, no skin infections, no pressure sores or ulcerations  Psych:   no anxiety, no depression, no nervousness, no unusual recent stress  Eyes:   no blurry vision, + floaters, no recent vision changes, no wears glasses or contacts  ENT:   no hearing loss, no loose or painful teeth, partial dentures, last saw dentist 1 year ago  Hematologic:  + easy bruising, no abnormal bleeding, no clotting disorder, no  frequent epistaxis  Endocrine:  + diabetes, does check CBG's at home           Physical Exam:   BP 124/67 (BP Location: Right Arm, Patient Position: Sitting, Cuff Size: Large)   Pulse 88   Resp 18   Ht 6' (1.829 m)   Wt 252 lb (114.3 kg)   SpO2 96% Comment: RA  BMI 34.18 kg/m   General:  Morbidly obese, o/w  well-appearing  HEENT:  Unremarkable   Neck:   no JVD, no bruits, no adenopathy   Chest:   clear to auscultation, symmetrical breath sounds, no wheezes, no rhonchi   CV:   Bradycardic w/ ectopic beats, grade III/VI crescendo/decrescendo murmur heard best at RSB,  no diastolic murmur  Abdomen:  soft, non-tender, no masses   Extremities:  warm, well-perfused, pulses not palpable, trace LE edema  Rectal/GU  Deferred  Neuro:   Grossly non-focal and symmetrical throughout  Skin:   Clean and dry, no rashes, no breakdown   Diagnostic Tests:  Echocardiography  Patient:    Jayten, Gabbard MR #:       [id number] Study Date: 05/16/2017 Gender:     M Age:        68 Height:     175.3 cm Weight:     77.5 kg BSA:        1.95 m^2 Pt. Status:  Room:   ATTENDING    Candee Furbish, M.D.  PERFORMING   [facility], Outpatient  SONOGRAPHER  [facility], [place]     Almyra Deforest [id number]  [place] Guardian [id number]  cc:  ------------------------------------------------------------------- LV EF: 55% -   60%  ------------------------------------------------------------------- Indications:      Aortic Stenosis (I35.0).  ------------------------------------------------------------------- History:   PMH:  Severe Aortic Stenosis (prior Mean Gradient 83mmHg, 01-16-17) Obstructive Sleep Apnea, Wenckebach Block, Edema, Diffuse Large Cell Non-Hodgkin&'s Lymphoma with Chemotherapy (4 treatments done and in Remission, with 4 Treatments left)  Dyspnea and murmur.  Coronary artery disease.  Aortic valve disease.  Risk factors:  Family history of coronary artery disease.  Hypertension. Diabetes mellitus. Dyslipidemia.  ------------------------------------------------------------------- Study Conclusions  - HPI and indications: Severe Aortic Stenosis (prior Mean Gradient   29mmHg, 01-16-17) - Left ventricle: The cavity size was normal. Systolic function was   normal. The estimated ejection fraction was in the range of 55%   to 60%. Wall motion was normal; there were no regional wall   motion abnormalities. Doppler parameters are consistent with   abnormal left ventricular relaxation (grade 1 diastolic   dysfunction). GLS: -17.2% - Aortic valve: Valve mobility was restricted. There was severe   stenosis. There was trivial regurgitation. Peak velocity (S): 492   cm/s. Mean gradient (S): 54 mm Hg. Valve area (VTI): 0.84 cm^2.   Valve area (Vmax): 0.83 cm^2. Valve area (Vmean): 0.82 cm^2. - Aorta: Ascending aortic diameter: 40 mm (S). - Ascending aorta: The ascending aorta was mildly dilated. - Mitral valve: Calcified annulus. There was mild regurgitation.   Valve area by continuity equation (using LVOT flow): 2 cm^2. - Left atrium: The atrium was severely dilated. Volume/bsa, ES   (1-plane Simpson&'s, A4C): 61.5 ml/m^2. - Right ventricle: The cavity size was moderately dilated. Wall   thickness was normal. - Right atrium: The atrium was severely dilated. - Tricuspid valve: There was mild regurgitation.  Impressions:  - Aortic stenosis has advanced.  ------------------------------------------------------------------- Study data:  Comparison was made to the study of 01/16/2017.  Study status:  Routine.  Procedure:  Transthoracic echocardiography. Image quality was adequate.          Echocardiography.  M-mode, complete 2D, 3D, spectral Doppler, and color Doppler.  Birthdate: Patient birthdate: [birth year].  Age:  Patient is 77 yr old.  Sex: Gender: male.    BMI: 25.2 kg/m^2.  Blood pressure:     140/72 Patient status:  Outpatient.  Study date:   Study date: 05/16/2017. Study time: 07:30 AM.  Location:  [place] [facility]  -------------------------------------------------------------------  ------------------------------------------------------------------- Left ventricle:  The cavity size was normal. Systolic function was normal. The estimated ejection fraction was in the range of 55% to 60%. Wall motion was normal; there were no regional wall motion abnormalities. GLS: -17.2% Doppler parameters are consistent with abnormal left ventricular relaxation (grade 1 diastolic dysfunction).  ------------------------------------------------------------------- Aortic valve:   Trileaflet; severely thickened, severely calcified leaflets. Valve mobility was restricted.  Doppler:   There was severe stenosis.   There was trivial regurgitation.    VTI ratio of LVOT to aortic valve: 0.22. Valve area (VTI): 0.84 cm^2. Indexed valve area (VTI): 0.43 cm^2/m^2. Peak velocity ratio of LVOT to aortic valve: 0.22. Valve area (Vmax): 0.83 cm^2. Indexed valve area (Vmax): 0.43 cm^2/m^2. Mean velocity ratio of LVOT to aortic valve: 0.22. Valve area (Vmean): 0.82 cm^2. Indexed valve area (Vmean): 0.42 cm^2/m^2.    Mean gradient (S): 54 mm  Hg. Peak gradient (S): 97 mm Hg.  ------------------------------------------------------------------- Aorta:  Ascending aorta: The ascending aorta was mildly dilated.  ------------------------------------------------------------------- Mitral valve:   Calcified annulus. Mobility was not restricted. Doppler:  Transvalvular velocity was within the normal range. There was no evidence for stenosis. There was mild regurgitation. Valve area by pressure half-time: 3.14 cm^2. Indexed valve area by pressure half-time: 1.61 cm^2/m^2. Valve area by continuity equation (using LVOT flow): 2 cm^2. Indexed valve area by continuity equation (using LVOT flow): 1.02 cm^2/m^2.    Mean gradient (D): 5 mm Hg. Peak gradient (D): 6  mm Hg.  ------------------------------------------------------------------- Left atrium:  The atrium was severely dilated.  ------------------------------------------------------------------- Right ventricle:  The cavity size was moderately dilated. Wall thickness was normal. Systolic function was normal.  ------------------------------------------------------------------- Pulmonic valve:    Doppler:  Transvalvular velocity was within the normal range. There was no evidence for stenosis.  ------------------------------------------------------------------- Tricuspid valve:   Structurally normal valve.    Doppler: Transvalvular velocity was within the normal range. There was mild regurgitation.  ------------------------------------------------------------------- Pulmonary artery:   The main pulmonary artery was normal-sized. Systolic pressure was within the normal range.  ------------------------------------------------------------------- Right atrium:  The atrium was severely dilated.  ------------------------------------------------------------------- Pericardium:  There was no pericardial effusion.  ------------------------------------------------------------------- Systemic veins: Inferior vena cava: The vessel was normal in size.  ------------------------------------------------------------------- Measurements   Left ventricle                           Value          Reference  LV ID, ED, PLAX chordal                  48.7  mm       43 - 52  LV ID, ES, PLAX chordal                  30.8  mm       23 - 38  LV fx shortening, PLAX chordal           37    %        >=29  LV PW thickness, ED                      17.5  mm       ----------  IVS/LV PW ratio, ED                      0.66           <=1.3  Stroke volume, 2D                        97    ml       ----------  Stroke volume/bsa, 2D                    50    ml/m^2   ----------  LV e&', lateral                            6.74  cm/s     ----------  LV E/e&', lateral                         18.84          ----------  LV e&', medial  8.38  cm/s     ----------  LV E/e&', medial                          15.16          ----------  LV e&', average                           7.56  cm/s     ----------  LV E/e&', average                         16.8           ----------  Longitudinal strain, TDI                 17    %        ----------    Ventricular septum                       Value          Reference  IVS thickness, ED                        11.5  mm       ----------    LVOT                                     Value          Reference  LVOT ID, S                               22    mm       ----------  LVOT area                                3.8   cm^2     ----------  LVOT peak velocity, S                    108   cm/s     ----------  LVOT mean velocity, S                    71.5  cm/s     ----------  LVOT VTI, S                              25.4  cm       ----------    Aortic valve                             Value          Reference  Aortic valve peak velocity, S            492   cm/s     ----------  Aortic valve mean velocity, S            330   cm/s     ----------  Aortic valve VTI, S  115   cm       ----------  Aortic mean gradient, S                  54    mm Hg    ----------  Aortic peak gradient, S                  97    mm Hg    ----------  VTI ratio, LVOT/AV                       0.22           ----------  Aortic valve area, VTI                   0.84  cm^2     ----------  Aortic valve area/bsa, VTI               0.43  cm^2/m^2 ----------  Velocity ratio, peak, LVOT/AV            0.22           ----------  Aortic valve area, peak velocity         0.83  cm^2     ----------  Aortic valve area/bsa, peak              0.43  cm^2/m^2 ----------  velocity  Velocity ratio, mean, LVOT/AV            0.22           ----------  Aortic valve area, mean  velocity         0.82  cm^2     ----------  Aortic valve area/bsa, mean              0.42  cm^2/m^2 ----------  velocity  Aortic regurg pressure half-time         436   ms       ----------    Aorta                                    Value          Reference  Aortic root ID, ED                       36    mm       ----------  Ascending aorta ID, A-P, S               40    mm       ----------    Left atrium                              Value          Reference  LA ID, A-P, ES                           52    mm       ----------  LA ID/bsa, A-P                    (H)    2.66  cm/m^2   <=2.2  LA volume, S  112   ml       ----------  LA volume/bsa, S                         57.4  ml/m^2   ----------  LA volume, ES, 1-p A4C                   120   ml       ----------  LA volume/bsa, ES, 1-p A4C               61.5  ml/m^2   ----------  LA volume, ES, 1-p A2C                   94.4  ml       ----------  LA volume/bsa, ES, 1-p A2C               48.4  ml/m^2   ----------    Mitral valve                             Value          Reference  Mitral E-wave peak velocity              127   cm/s     ----------  Mitral A-wave peak velocity              148   cm/s     ----------  Mitral mean velocity, D                  96.9  cm/s     ----------  Mitral deceleration time          (H)    236   ms       150 - 230  Mitral pressure half-time                70    ms       ----------  Mitral mean gradient, D                  5     mm Hg    ----------  Mitral peak gradient, D                  6     mm Hg    ----------  Mitral E/A ratio, peak                   0.9            ----------  Mitral valve area, PHT, DP               3.14  cm^2     ----------  Mitral valve area/bsa, PHT, DP           1.61  cm^2/m^2 ----------  Mitral valve area, LVOT                  2     cm^2     ----------  continuity  Mitral valve area/bsa, LVOT              1.02  cm^2/m^2 ----------  continuity   Mitral annulus VTI, D                    48.3  cm       ----------  Mitral regurg VTI, PISA                  229   cm       ----------    Tricuspid valve                          Value          Reference  Tricuspid regurg peak velocity           300   cm/s     ----------  Tricuspid peak RV-RA gradient            36    mm Hg    ----------    Right atrium                             Value          Reference  RA ID, S-I, ES, A4C               (H)    69.3  mm       34 - 49  RA area, ES, A4C                  (H)    30.3  cm^2     8.3 - 19.5  RA volume, ES, A/L                       107   ml       ----------  RA volume/bsa, ES, A/L                   54.8  ml/m^2   ----------    Right ventricle                          Value          Reference  TAPSE                                    21.1  mm       ----------  RV s&', lateral, S                        14.5  cm/s     ----------  Legend: (L)  and  (H)  mark values outside specified reference range.  ------------------------------------------------------------------- Prepared and Electronically Authenticated by  Candee Furbish, M.D. 2018-11-16T10:14:52   RIGHT/LEFT HEART CATH AND CORONARY/GRAFT ANGIOGRAPHY  Conclusion   1.  Severe native three-vessel coronary artery disease with moderate distal left main stenosis, total occlusion of the LAD, total occlusion of the ramus intermedius, total occlusion of the first OM, and total occlusion of the RCA. 2.  Status post aortocoronary bypass surgery with continued patency of the RIMA to PDA, LIMA to LAD, and sequential saphenous vein graft to ramus intermedius and first OM branches of the circumflex. 3.  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  Severe calcific aortic stenosis with a mean transvalvular gradient of 51 mmHg  Indications   Severe aortic stenosis [I35.0 (ICD-10-CM)]  Procedural Details/Technique   Technical  Details INDICATION: Severe symptomatic aortic stenosis  PROCEDURAL DETAILS: The right groin  was prepped, draped, and anesthetized with 1% lidocaine. Using ultrasound guidance and a micropuncture technique, a 5 French sheath was placed in the right femoral artery and a 7 French sheath was placed in the right femoral vein. Both vessels were accessed with a front wall puncture. The ultrasound images captured and stored in the patient's chart. A Swan-Ganz catheter was used for the right heart catheterization. Standard protocol was followed for recording of right heart pressures and sampling of oxygen saturations. Fick cardiac output was calculated. Standard Judkins catheters were used for selective coronary angiography and bypass graft angiography. The aortic valve is crossed with an AL 2 catheter and a straight wire. Left ventricular pressure is recorded. And aortic valve pullback is performed. There were no immediate procedural complications. The patient was transferred to the post catheterization recovery area for further monitoring.      Estimated blood loss <50 mL.  During this procedure the patient was administered the following to achieve and maintain moderate conscious sedation: Versed 1 mg, Fentanyl 25 mcg, while the patient's heart rate, blood pressure, and oxygen saturation were continuously monitored. The period of conscious sedation was 49 minutes, of which I was present face-to-face 100% of this time.  Coronary Findings   Diagnostic  Dominance: Right  Left Main  Mid LM to Dist LM lesion 60% stenosed  Mid LM to Dist LM lesion is 60% stenosed. The lesion is moderately calcified.  Left Anterior Descending  Prox LAD to Mid LAD lesion 100% stenosed  Prox LAD to Mid LAD lesion is 100% stenosed. The lesion is moderately calcified.  First Diagonal Branch  Collaterals  1st Diag filled by collaterals from 3rd Diag.    Second Diagonal Branch  Collaterals  2nd Diag filled by collaterals  from Dist LAD.    Ramus Intermedius  Ost Ramus lesion 100% stenosed  Ost Ramus lesion is 100% stenosed.  Left Circumflex  First Obtuse Marginal Branch  Ost 1st Mrg lesion 100% stenosed  Ost 1st Mrg lesion is 100% stenosed.  Right Coronary Artery  Prox RCA to Mid RCA lesion 100% stenosed  Prox RCA to Mid RCA lesion is 100% stenosed.  LIMA Graft to Mid LAD  Graft to Ost 1st Diag  Origin to Prox Graft lesion 100% stenosed  Origin to Prox Graft lesion is 100% stenosed. SVG-diagonal 100% occluded unchanged from prior study  Sequential jump graft Graft to Ramus, 1st Mrg  Seq SVG- Ramus Intermedius and First OM graft was visualized by angiography. The graft exhibits minimal luminal irregularities. Widely patent graft fills the ramus intermedius, first OM, and retrograde fills the distal AV circumflex into the second OM.  Free RIMA Graft to RPDA  RIMA. The RIMA to PDA graft is widely patent.  Intervention   No interventions have been documented.  Left Heart   Aortic Valve There is severe aortic valve stenosis. The aortic valve is calcified. There is restricted aortic valve motion. The mean transaortic gradient is 51 mmHg. The peak to peak gradient is 68 mmHg. The calculated aortic valve area is 1.2 cm.  Coronary Diagrams   Diagnostic Diagram       Implants     No implant documentation for this case.  MERGE Images   Show images for CARDIAC CATHETERIZATION   Link to Procedure Log   Procedure Log    Hemo Data    Most Recent Value  Fick Cardiac Output 5.65 L/min  Fick Cardiac Output Index 2.44 (L/min)/BSA  Aortic Mean Gradient 51.2 mmHg  Aortic Peak Gradient 68 mmHg  Aortic Valve Area 1.19  Aortic Value Area Index 0.51 cm2/BSA  RA A Wave 6 mmHg  RA V Wave 6 mmHg  RA Mean 3 mmHg  RV Systolic Pressure 36 mmHg  RV Diastolic Pressure -2 mmHg  RV EDP 6 mmHg  PA Systolic Pressure 26 mmHg  PA Diastolic Pressure 8 mmHg  PA Mean 14 mmHg  PW A Wave 9 mmHg  PW V Wave 11 mmHg    PW Mean 7 mmHg  AO Systolic Pressure 96 mmHg  AO Diastolic Pressure 44 mmHg  AO Mean 65 mmHg  LV Systolic Pressure 761 mmHg  LV Diastolic Pressure -9 mmHg  LV EDP 16 mmHg  Arterial Occlusion Pressure Extended Systolic Pressure 607 mmHg  Arterial Occlusion Pressure Extended Diastolic Pressure 39 mmHg  Arterial Occlusion Pressure Extended Mean Pressure 60 mmHg  Left Ventricular Apex Extended Systolic Pressure 371 mmHg  Left Ventricular Apex Extended Diastolic Pressure 0 mmHg  Left Ventricular Apex Extended EDP Pressure 17 mmHg  QP/QS 1  TPVR Index 5.75 HRUI  TSVR Index 26.71 HRUI  PVR SVR Ratio 0.11  TPVR/TSVR Ratio 0.22    Cardiac TAVR CT  TECHNIQUE: The patient was scanned on a Siemens Force 062 slice scanner. A 120 kV retrospective scan was triggered in the ascending thoracic aorta at 140 HU's. Gantry rotation speed was 270 msecs and collimation was .9 mm. No beta blockade or nitro were given. The 3D data set was reconstructed in 5% intervals of the R-R cycle. Systolic and diastolic phases were analyzed on a dedicated work station using MPR, MIP and VRT modes. The patient received 80 cc of contrast.  FINDINGS: Aortic Valve: Tri leaflet severely calcified with restricted motion  Aorta: Moderate calcific atherosclerotic debris  Sinotubular Junction: 30 mm  Ascending Thoracic Aorta: 37 mm  Aortic Arch: 29 mm  Descending Thoracic Aorta: 30 mm  Sinus of Valsalva Measurements:  Non-coronary: 32 mm  Right -coronary: 30 mm  Left -coronary: 31 mm  Coronary Artery Height above Annulus:  Left Main: 10.6 mm above annulus  Right Coronary: 13.4 mm above annulus  Virtual Basal Annulus Measurements:  Maximum/Minimum Diameter: 27.5 mm x 21.9 mm  Perimeter: 77.7 mm  Area: 476 mm2  Coronary Arteries: Sufficient height above annulus for deployment. There is a patent RIMA to PDA, Patent LIMA to LAD, Patent sequential SVG to IM/OM and Occluded SVG  to diagonal  Optimum Fluoroscopic Angle for Delivery: LAO 11 degrees Caudal 14 degrees  IMPRESSION: 1. Calcified tri leaflet AV with annulus 476 mm2 suitable for a 26 mm Sapien 3 valve  2. Optimum angiographic angle for delivery LAO 11 degrees Caudal 14 degrees  3. Coronary arteries sufficient height above annulus for deployment Patent RIMA to PDA, Patent LIMA to LAD, Patent sequential SVG IM/OM Occluded SVG Diagonal  4.  No LAA thrombus  Jenkins Rouge   Electronically Signed   By: Jenkins Rouge M.D.   On: 07/29/2017 13:40   CT ANGIOGRAPHY CHEST, ABDOMEN AND PELVIS  TECHNIQUE: Multidetector CT imaging through the chest, abdomen and pelvis was performed using the standard protocol during bolus administration of intravenous contrast. Multiplanar reconstructed images and MIPs were obtained and reviewed to evaluate the vascular anatomy.  CONTRAST:  41mL ISOVUE-370 IOPAMIDOL (ISOVUE-370) INJECTION 76%  COMPARISON:  PET-CT 04/15/2017.  FINDINGS: CTA CHEST FINDINGS  Cardiovascular: Heart size is mildly enlarged. There is no significant pericardial fluid, thickening or pericardial calcification. There is aortic atherosclerosis, as well as atherosclerosis of  the great vessels of the mediastinum and the coronary arteries, including calcified atherosclerotic plaque in the left main, left anterior descending, left circumflex and right coronary arteries. Status post median sternotomy for CABG including LIMA to the LAD. Severe thickening calcification of the aortic valve. Right internal jugular single-lumen porta cath with tip terminating in the right atrium.  Mediastinum/Lymph Nodes: No pathologically enlarged mediastinal or hilar lymph nodes. Esophagus is unremarkable in appearance. No axillary lymphadenopathy. Surgical clips are noted in the left axillary region from prior lymph node dissection. Superficial to this there is a 4.2 x 3.2 cm low-attenuation  lesion, likely to represent a small postoperative seroma.  Lungs/Pleura: No acute consolidative airspace disease. No pleural effusions. No suspicious appearing pulmonary nodules or masses.  Musculoskeletal/Soft Tissues: Median sternotomy wires. There are no aggressive appearing lytic or blastic lesions noted in the visualized portions of the skeleton.  CTA ABDOMEN AND PELVIS FINDINGS  Hepatobiliary: No suspicious cystic or solid hepatic lesions. No intra or extrahepatic biliary ductal dilatation. Gallbladder is normal in appearance.  Pancreas: No pancreatic mass. No pancreatic ductal dilatation. No pancreatic or peripancreatic fluid or inflammatory changes.  Spleen: Unremarkable.  Adrenals/Urinary Tract: Bilateral kidneys and bilateral adrenal glands are normal in appearance. No hydroureteronephrosis. Urinary bladder is mildly trabeculated, but otherwise unremarkable in appearance.  Stomach/Bowel: Normal appearance of the stomach. No pathologic dilatation of small bowel or colon. The appendix is not confidently identified and may be surgically absent. Regardless, there are no inflammatory changes noted adjacent to the cecum to suggest the presence of an acute appendicitis at this time.  Vascular/Lymphatic: Vascular findings and measurements relevant to potential TAVR procedure, as detailed below. Aortic atherosclerosis, without evidence of aneurysm. Aneurysmal dilatation of the right common iliac artery which measures up to 2 cm in diameter. Celiac axis, superior mesenteric artery and inferior mesenteric artery are all widely patent without hemodynamically significant stenosis. Two right renal arteries and one left renal artery are all widely patent without hemodynamically significant stenosis. No lymphadenopathy noted in the abdomen or pelvis.  Reproductive: Prostate gland and seminal vesicles are unremarkable in appearance.  Other: No significant volume of  ascites.  No pneumoperitoneum.  Musculoskeletal: There are no aggressive appearing lytic or blastic lesions noted in the visualized portions of the skeleton.  VASCULAR MEASUREMENTS PERTINENT TO TAVR:  AORTA:  Minimal Aortic Diameter-12 x 15 mm  Severity of Aortic Calcification-moderate to severe  RIGHT PELVIS:  Right Common Iliac Artery -  Minimal Diameter-9.8 x 7.8 mm  Tortuosity-mild-to-moderate  Calcification-mild  Right External Iliac Artery -  Minimal Diameter-10.1 x 9.2 mm  Tortuosity-moderate  Calcification-mild  Right Common Femoral Artery -  Minimal Diameter-10.0 x 9.0 mm  Tortuosity-mild  Calcification-mild  LEFT PELVIS:  Left Common Iliac Artery -  Minimal Diameter-8.8 x 7.5 mm  Tortuosity-mild  Calcification-mild-to-moderate  Left External Iliac Artery -  Minimal Diameter-10.1 x 8.8 mm  Tortuosity-mild  Calcification-mild  Left Common Femoral Artery -  Minimal Diameter-10.6 x 10.1 mm  Tortuosity-mild  Calcification-mild  Review of the MIP images confirms the above findings.  IMPRESSION: 1. Vascular findings and measurements pertinent to potential TAVR procedure, as detailed above. 2. Severe thickening calcification of the aortic valve, compatible with the reported clinical history of severe aortic stenosis. 3. Aortic atherosclerosis, in addition to left main and 3 vessel coronary artery disease. Status post median sternotomy for CABG including LIMA to the LAD. 4. Mild aneurysmal dilatation of the right common iliac artery which measures up to 2.0 cm in diameter.  5. Additional incidental findings, as above.  Aortic Atherosclerosis (ICD10-I70.0).   Electronically Signed   By: Vinnie Langton M.D.   On: 07/29/2017 15:52   STS Risk Calculator  Procedure: Isolated AVR CALCULATE  Risk of Mortality:  3.380%   Renal Failure:  3.847%   Permanent Stroke:  2.090%   Prolonged Ventilation:   11.304%   DSW Infection:  0.733%   Reoperation:  4.299%   Morbidity or Mortality:  17.533%   Short Length of Stay:  23.906%   Long Length of Stay:  9.280%      Impression:  Patient has stage D severe symptomatic aortic stenosis.  He describes stable symptoms of exertional shortness of breath and chest tightness consistent with chronic diastolic congestive heart failure and angina pectoris, New York Heart Association functional class III.  I have personally reviewed the patient's most recent transthoracic echocardiogram, diagnostic cardiac catheterization, and CT angiograms.  Echocardiogram reveals the presence of severe aortic stenosis.  The aortic valve is trileaflet with severe thickening, calcification, and restricted leaflet mobility involving all 3 leaflets.  Peak velocity across the aortic valve measured 4.9 m/s corresponding to mean transvalvular gradient estimated 54 mmHg.  Left ventricular systolic function remains normal with ejection fraction estimated 55-60%.  Diagnostic cardiac catheterization confirmed the presence of severe aortic stenosis and revealed severe native coronary artery disease.  However, 4 out of 5 bypass graft performed at the time of the patient's previous bypass surgery in 2001 were widely patent and free of significant disease.  Risks associated with conventional surgical aortic valve replacement would be at least moderately elevated and probably somewhat higher than that predicted using the STS risk calculator because of the patient's recent chemotherapy for newly diagnosed large B-cell lymphoma.  Cardiac-gated CTA of the heart reveals anatomical characteristics consistent with aortic stenosis suitable for treatment by transcatheter aortic valve replacement without any significant complicating features and CTA of the aorta and iliac vessels demonstrate what appears to be adequate pelvic vascular access to facilitate a transfemoral approach.    Plan:  The patient  and his daughter were counseled at length regarding treatment alternatives for management of severe symptomatic aortic stenosis. Alternative approaches such as conventional aortic valve replacement, transcatheter aortic valve replacement, and palliative medical therapy were compared and contrasted at length.  The risks associated with conventional surgical aortic valve replacement were been discussed in detail, as were expectations for post-operative convalescence, and why I would be reluctant to consider this patient a candidate for conventional surgery.  Issues specific to transcatheter aortic valve replacement were discussed including questions about long term valve durability, the potential for paravalvular leak, possible increased risk of need for permanent pacemaker placement, and other technical complications related to the procedure itself.  Long-term prognosis with medical therapy was discussed. This discussion was placed in the context of the patient's own specific clinical presentation and past medical history.  All of their questions been addressed.  The patient hopes to proceed with transcatheter aortic valve replacement as soon as possible once he has been seen in follow-up and cleared for surgery by Dr. Marin Olp.  He apparently has been scheduled for follow-up PET/CT imaging on September 01, 2017.  He will see Dr. Marin Olp later that week.  We tentatively plan to proceed with transcatheter aortic valve replacement on September 09, 2017.  Following the decision to proceed with transcatheter aortic valve replacement, a discussion has been held regarding what types of management strategies would be attempted intraoperatively in the event of  life-threatening complications, including whether or not the patient would be considered a candidate for the use of cardiopulmonary bypass and/or conversion to open sternotomy for attempted surgical intervention.  The patient has been advised of a variety of complications that  might develop including but not limited to risks of death, stroke, paravalvular leak, aortic dissection or other major vascular complications, aortic annulus rupture, device embolization, cardiac rupture or perforation, mitral regurgitation, acute myocardial infarction, arrhythmia, heart block or bradycardia requiring permanent pacemaker placement, congestive heart failure, respiratory failure, renal failure, pneumonia, infection, other late complications related to structural valve deterioration or migration, or other complications that might ultimately cause a temporary or permanent loss of functional independence or other long term morbidity.  The patient provides full informed consent for the procedure as described and all questions were answered.   I spent in excess of 90 minutes during the conduct of this office consultation and >50% of this time involved direct face-to-face encounter with the patient for counseling and/or coordination of their care.    Valentina Gu. Roxy Manns, MD 08/12/2017 3:50 PM

## 2017-08-12 NOTE — Patient Instructions (Signed)
Continue all previous medications without any changes at this time  

## 2017-08-12 NOTE — Therapy (Signed)
[place], [place], [address] Phone: [phone]   Fax:  [phone]  Physical Therapy Evaluation  Patient Details  Name: Richard [name] MRN: [id number] Date of Birth: March 06, 1941 Referring Provider: Dr. Lauree Chandler   Encounter Date: 08/12/2017  PT End of Session - 08/12/17 0919    Visit Number  1    PT Start Time  0816    PT Stop Time  0853    PT Time Calculation (min)  37 min       Past Medical History:  Diagnosis Date  . Anemia   . Axillary adenopathy 02/25/2017  . Bradycardia    a. holter monitor has demonstrated HRs in 30s and Weinkibach   . CAD (coronary artery disease)    a. s/p CABG 2001  . Chronic lower back pain   . Diffuse large B cell lymphoma ([place])   . Diverticulosis   . Esophageal stricture   . GERD (gastroesophageal reflux disease)   . History of gout   . HTN (hypertension)   . Mixed hyperlipidemia   . OSA on CPAP    with 2L O2 at night  . Pancytopenia ([place])    a. related to chemo therapy for B cell lymphoma  . Peptic stricture of esophagus   . Severe aortic stenosis   . Spinal stenosis   . Type II diabetes mellitus ([place])     Past Surgical History:  Procedure Laterality Date  . APPENDECTOMY  ~ 1952  . BACK SURGERY    . CATARACT EXTRACTION W/PHACO Left 11/06/2016   Procedure: CATARACT EXTRACTION PHACO AND INTRAOCULAR LENS PLACEMENT (IOC);  Surgeon: Estill Cotta, MD;  Location: [facility] ORS;  Service: Ophthalmology;  Laterality: Left;  Lot # [id number] H [place]: 01:09.4 AP%:25.2 CDE: 30.64  . CATARACT EXTRACTION W/PHACO Right 12/04/2016   Procedure: CATARACT EXTRACTION PHACO AND INTRAOCULAR LENS PLACEMENT (IOC);  Surgeon: Estill Cotta, MD;  Location: [facility];  Service: Ophthalmology;  Laterality: Right;  [place] 1:25.9 AP% 24.1 CDE 39.10 Fluid Pack lot # [id number]  . COLONOSCOPY W/ BIOPSIES AND POLYPECTOMY  2013  . CORONARY ANGIOPLASTY  1993  . CORONARY ANGIOPLASTY WITH  STENT PLACEMENT  05/1997   "1"  . CORONARY ARTERY BYPASS GRAFT  03/2000   "CABG X5"  . ESOPHAGEAL DILATION  X 3-4   Dr. Lyla Son; "last one was in the 1990's"  . ESOPHAGOGASTRODUODENOSCOPY     multiple  . FLEXIBLE SIGMOIDOSCOPY     multiple  . HYDRADENITIS EXCISION Left 02/25/2017   Procedure: EXCISION DEEP LEFT AXILLARY LYMPH NODE;  Surgeon: Fanny Skates, MD;  Location: [facility];  Service: General;  Laterality: Left;  . KNEE ARTHROSCOPY Left 2011   meniscus repair  . LEFT HEART CATHETERIZATION WITH CORONARY/GRAFT ANGIOGRAM N/A 03/16/2014   Procedure: LEFT HEART CATHETERIZATION WITH Beatrix Fetters;  Surgeon: Burnell Blanks, MD;  Location: [facility] CATH LAB;  Service: Cardiovascular;  Laterality: N/A;  . LUMBAR LAMINECTOMY/DECOMPRESSION MICRODISCECTOMY Right 06/17/2013   Procedure: LUMBAR LAMINECTOMY MICRODISCECTOMY L4-L5 RIGHT EXCISION OF SYNOVIAL CYST RIGHT   (1 LEVEL) RIGHT PARTIAL FACETECTOMY;  Surgeon: Tobi Bastos, MD;  Location: [facility];  Service: Orthopedics;  Laterality: Right;  . MYELOGRAM  04/06/13   lumbar, Dr Gladstone Lighter  . PANENDOSCOPY    . PORTACATH PLACEMENT N/A 03/06/2017   Procedure: INSERTION PORT-A-CATH AND ASPIRATE SEROMA LEFT AXILLA;  Surgeon: Fanny Skates, MD;  Location: [facility];  Service: General;  Laterality: N/A;  . RIGHT/LEFT HEART CATH AND CORONARY/GRAFT ANGIOGRAPHY  N/A 07/28/2017   Procedure: RIGHT/LEFT HEART CATH AND CORONARY/GRAFT ANGIOGRAPHY;  Surgeon: Sherren Mocha, MD;  Location: [facility] CV LAB;  Service: Cardiovascular;  Laterality: N/A;  . SHOULDER SURGERY Right 08/2010   screws placed; "tendons tore off"  . SKIN CANCER EXCISION Right    "neck"  . TONSILLECTOMY  ~ 1954  . UPPER GI ENDOSCOPY  2013   Gastritis; Dr Carlean Purl  . VASECTOMY      There were no vitals filed for this visit.   Subjective Assessment - 08/12/17 0819    Subjective  Pt reports several months of worsening progression of shortness of breath with activity and chest  pressure, recently progressed to symptoms at rest and went to ED 1/28. Pt just completed chemotherapy for large B-cell lymphoma and hopes to undergo valve replacement in 4-5 weeks when blood counts recover.     Patient Stated Goals  to fix heart and return to golf    Currently in Pain?  No/denies         [facility] PT Assessment - 08/12/17 0001      Assessment   Medical Diagnosis  severe aortic stenosis    Referring Provider  Dr. Lauree Chandler    Onset Date/Surgical Date  -- approximately 6 weeks ago      Precautions   Precautions  None      Restrictions   Weight Bearing Restrictions  No      Balance Screen   Has the patient fallen in the past 6 months  No    Has the patient had a decrease in activity level because of a fear of falling?   No    Is the patient reluctant to leave their home because of a fear of falling?   No      Home Environment   Living Environment  Private residence    Living Arrangements  Alone    Home Access  Level entry    Home Layout  Two level      Prior Function   Level of Independence  Independent with community mobility without device      Posture/Postural Control   Posture/Postural Control  Postural limitations    Postural Limitations  Forward head;Rounded Shoulders      ROM / Strength   AROM / PROM / Strength  AROM;Strength      AROM   Overall AROM Comments  grossly WNL      Strength   Overall Strength Comments  grossly 5/5 throughout UE, 4/5 throughout LE    Strength Assessment Site  Hand    Right/Left hand  Right;Left    Right Hand Grip (lbs)  85 R hand dominant    Left Hand Grip (lbs)  79      Ambulation/Gait   Gait Comments  Pt ambulates with mild antalgic gait due to arthritis in his hips. Gait distance was limited by 58% in 6 minute walk for age/gender and he required 3 rest breaks throughout the 6 minute walk.        [facility] Pre-Surgical Assessment - 08/12/17 0001    5 Meter Walk Test- trial 1  7 sec    5 Meter Walk Test-  trial 2  6 sec.     5 Meter Walk Test- trial 3  5 sec.    5 meter walk test average  6 sec    4 Stage Balance Test tolerated for:   5 sec.    4 Stage Balance Test Position  4  Sit To Stand Test- trial 1  11 sec.    ADL/IADL Independent with:  Bathing;Dressing;Meal prep;Finances    ADL/IADL Needs Assistance with:  [employer] Leaver work    ADL/IADL Fraility Index  Vulnerable    6 Minute Walk- Baseline  yes    BP (mmHg)  109/67    HR (bpm)  88    02 Sat (%RA)  94 %    Modified Borg Scale for Dyspnea  0- Nothing at all    Perceived Rate of Exertion (Borg)  6-    6 Minute Walk Post Test  yes    BP (mmHg)  135/66    HR (bpm)  113    02 Sat (%RA)  93 %    Modified Borg Scale for Dyspnea  3- Moderate shortness of breath or breathing difficulty    Perceived Rate of Exertion (Borg)  9- very light    Aerobic Endurance Distance Walked  715           Objective measurements completed on examination: See above findings.                           Plan - 08/12/17 0919    Clinical Impression Statement  see below    PT Frequency  One time visit    Consulted and Agree with Plan of Care  Patient      Clinical Impression Statement: Pt is a 77 yo male presenting to OP PT for evaluation prior to possible TAVR surgery due to severe aortic stenosis. Pt reports onset of worsening shortness of breath and chest pressure approximately 1.5 months ago. Pt has just completed chemotherapy for large B-cell lymphoma and hopes to be cleared for TAVR procedure in 4-5 weeks. Symptoms are limiting ability to play golf and walk community distances. Pt presents with good ROM and strength overall although mild weakness noted throughout lower extremities, good balance and is not at high fall risk 4 stage balance test, good walking speed and poor aerobic endurance per 6 minute walk test. Pt able to walk about 1 minute and 185' each bout before needing rest break ranging from 30 seconds initially and up to a  minute by the end. Pt ambulated a total of 715 feet in 6 minute walk. BP increased significantly from resting baseline with 6 minute walk test. Based on the Short Physical Performance Battery, patient has a frailty rating of 12/12 with </= 5/12 considered frail.   Patient demonstrated the following deficits and impairments:     Visit Diagnosis: Other abnormalities of gait and mobility  Abnormal posture  Muscle weakness (generalized)     Problem List Patient Active Problem List   Diagnosis Date Noted  . Hyperlipidemia 08/05/2017  . NSTEMI (non-ST elevated myocardial infarction) ([place])   . Aortic stenosis, severe 07/25/2017  . Pancytopenia ([place]) 07/24/2017  . 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 ([place]) 02/27/2017  . Bradycardia 01/04/2017  . Coronary artery disease 01/04/2017  . Stenosis of carotid artery 01/04/2017  . Obesity 09/18/2016  . Wenckebach block   . Spinal stenosis, lumbar region, with neurogenic claudication 06/17/2013  . DM (diabetes mellitus) type II uncontrolled, periph vascular disorder ([place]) 05/21/2013  . Spinal stenosis of lumbar region 04/08/2013  . Anemia, iron deficiency 11/29/2011  . B12 deficiency anemia 07/30/2011  . OSA (obstructive sleep apnea) 07/06/2010  . CAD, ARTERY BYPASS GRAFT 08/22/2009  . Essential hypertension 03/29/2009  . Bilateral lower extremity edema 02/21/2009  .  Hyperlipidemia LDL goal <70 11/26/2008    [place], PT 08/12/2017, 9:20 AM  [facility] [address] [place], [place], [address] Phone: [phone]   Fax:  [phone]  Name: AMARIO LONGMORE MRN: [id number] Date of Birth: 1941-01-01

## 2017-08-13 ENCOUNTER — Telehealth: Payer: Self-pay | Admitting: Family Medicine

## 2017-08-13 DIAGNOSIS — L600 Ingrowing nail: Secondary | ICD-10-CM

## 2017-08-13 NOTE — Telephone Encounter (Signed)
Referral placed.

## 2017-08-13 NOTE — Telephone Encounter (Signed)
Copied from [place]. Topic: General - Other >> Aug 13, 2017  3:16 PM Lolita Rieger, [place] wrote: Reason for CRM: pt would like to be referred to a podiatrist due to an ingrown toenail that he has and is afraid will become infected Please contact pt if this can be done without him coming in again sine he was in the office a week ago [phone]

## 2017-08-14 ENCOUNTER — Ambulatory Visit (INDEPENDENT_AMBULATORY_CARE_PROVIDER_SITE_OTHER): Payer: Medicare Other | Admitting: Podiatry

## 2017-08-14 ENCOUNTER — Encounter: Payer: Self-pay | Admitting: Podiatry

## 2017-08-14 VITALS — BP 130/83 | HR 76 | Resp 16

## 2017-08-14 DIAGNOSIS — I25118 Atherosclerotic heart disease of native coronary artery with other forms of angina pectoris: Secondary | ICD-10-CM

## 2017-08-14 DIAGNOSIS — L03032 Cellulitis of left toe: Secondary | ICD-10-CM | POA: Diagnosis not present

## 2017-08-14 DIAGNOSIS — L600 Ingrowing nail: Secondary | ICD-10-CM | POA: Diagnosis not present

## 2017-08-14 MED ORDER — NEOMYCIN-POLYMYXIN-HC 1 % OT SOLN: OTIC | 1 refills | Status: DC

## 2017-08-14 MED ORDER — DOXYCYCLINE HYCLATE 100 MG PO TABS: 100.0000 mg | ORAL_TABLET | Freq: Two times a day (BID) | ORAL | 0 refills | Status: DC

## 2017-08-14 NOTE — Patient Instructions (Addendum)

## 2017-08-14 NOTE — Progress Notes (Signed)
Subjective:  Patient ID: Richard [name], male    DOB: 01/17/1941,  MRN: [id number] HPI Chief Complaint  Patient presents with  . Toe Pain    Hallux left - both borders x 1 month, red and swollen, gets pedicures to trim toenails, just finished chemo for lymphoma   Patient is scheduled for heart valve surgery early March.  77 y.o. male presents with the above complaint.     Past Medical History:  Diagnosis Date  . Anemia   . Axillary adenopathy 02/25/2017  . Bradycardia    a. holter monitor has demonstrated HRs in 30s and Weinkibach   . CAD (coronary artery disease)    a. s/p CABG 2001  . Chronic lower back pain   . Diffuse large B cell lymphoma ([place])   . Diverticulosis   . Esophageal stricture   . GERD (gastroesophageal reflux disease)   . History of gout   . HTN (hypertension)   . Mixed hyperlipidemia   . OSA on CPAP    with 2L O2 at night  . Pancytopenia ([place])    a. related to chemo therapy for B cell lymphoma  . Peptic stricture of esophagus   . Severe aortic stenosis   . Spinal stenosis   . Type II diabetes mellitus ([place])    Past Surgical History:  Procedure Laterality Date  . APPENDECTOMY  ~ 1952  . BACK SURGERY    . CATARACT EXTRACTION W/PHACO Left 11/06/2016   Procedure: CATARACT EXTRACTION PHACO AND INTRAOCULAR LENS PLACEMENT (IOC);  Surgeon: Estill Cotta, MD;  Location: [facility] OR[facility];  Service: Ophthalmology;  Laterality: Left;  Lot # [id number] H [place]: 01:09.4 AP%:25.2 CDE: 30.64  . CATARACT EXTRACTION W/PHACO Right 12/04/2016   Procedure: CATARACT EXTRACTION PHACO AND INTRAOCULAR LENS PLACEMENT (IOC);  Surgeon: Estill Cotta, MD;  Location: [facility] ORS;  Service: Ophthalmology;  Laterality: Right;  [place] 1:25.9 AP% 24.1 CDE 39.10 Fluid Pack lot # [id number]  . COLONOSCOPY W/ BIOPSIES AND POLYPECTOMY  2013  . CORONARY ANGIOPLASTY  1993  . CORONARY ANGIOPLASTY WITH STENT PLACEMENT  05/1997   "1"  . CORONARY ARTERY BYPASS GRAFT  03/2000   "CABG X5"  . ESOPHAGEAL  DILATION  X 3-4   Dr. Lyla Son; "last one was in the 1990's"  . ESOPHAGOGASTRODUODENOSCOPY     multiple  . FLEXIBLE SIGMOIDOSCOPY     multiple  . HYDRADENITIS EXCISION Left 02/25/2017   Procedure: EXCISION DEEP LEFT AXILLARY LYMPH NODE;  Surgeon: Fanny Skates, MD;  Location: [facility];  Service: General;  Laterality: Left;  . KNEE ARTHROSCOPY Left 2011   meniscus repair  . LEFT HEART CATHETERIZATION WITH CORONARY/GRAFT ANGIOGRAM N/A 03/16/2014   Procedure: LEFT HEART CATHETERIZATION WITH Beatrix Fetters;  Surgeon: Burnell Blanks, MD;  Location: [facility] CATH LAB;  Service: Cardiovascular;  Laterality: N/A;  . LUMBAR LAMINECTOMY/DECOMPRESSION MICRODISCECTOMY Right 06/17/2013   Procedure: LUMBAR LAMINECTOMY MICRODISCECTOMY L4-L5 RIGHT EXCISION OF SYNOVIAL CYST RIGHT   (1 LEVEL) RIGHT PARTIAL FACETECTOMY;  Surgeon: Tobi Bastos, MD;  [facility];  Service: Orthopedics;  Laterality: Right;  . MYELOGRAM  04/06/13   lumbar, Dr Gladstone Lighter  . PANENDOSCOPY    . PORTACATH PLACEMENT N/A 03/06/2017   Procedure: INSERTION PORT-A-CATH AND ASPIRATE SEROMA LEFT AXILLA;  Surgeon: Fanny Skates, MD;  Location: [facility];  Service: General;  Laterality: N/A;  . RIGHT/LEFT HEART CATH AND CORONARY/GRAFT ANGIOGRAPHY N/A 07/28/2017   Procedure: RIGHT/LEFT HEART CATH AND CORONARY/GRAFT ANGIOGRAPHY;  Surgeon: Sherren Mocha, MD;  Location: [facility] CV  LAB;  Service: Cardiovascular;  Laterality: N/A;  . SHOULDER SURGERY Right 08/2010   screws placed; "tendons tore off"  . SKIN CANCER EXCISION Right    "neck"  . TONSILLECTOMY  ~ 1954  . UPPER GI ENDOSCOPY  2013   Gastritis; Dr Carlean Purl  . VASECTOMY      Current Outpatient Medications:  .  allopurinol (ZYLOPRIM) 300 MG tablet, Take 450 mg by mouth daily. , Disp: , Rfl:  .  amLODipine (NORVASC) 5 MG tablet, TAKE 2 TABLETS (10 MG TOTAL) BY MOUTH DAILY. (Patient taking differently: TAKE 5 MG BY MOUTH TWICE DAILY.), Disp: 60 tablet, Rfl: 11 .   amoxicillin-clavulanate (AUGMENTIN) 875-125 MG tablet, Take 1 tablet by mouth 2 (two) times daily., Disp: 20 tablet, Rfl: 0 .  aspirin 81 MG tablet, Take 81 mg by mouth daily., Disp: , Rfl:  .  atorvastatin (LIPITOR) 80 MG tablet, TAKE ONE TABLET BY MOUTH AT BEDTIME, Disp: 90 tablet, Rfl: 3 .  diphenoxylate-atropine (LOMOTIL) 2.5-0.025 MG tablet, Take 2 pills at the onset of diarrhea, then take one pill after every loose stool. Maximum 8 pills per 24h., Disp: 60 tablet, Rfl: 0 .  esomeprazole (NEXIUM) 40 MG capsule, Take 1 capsule (40 mg total) by mouth daily., Disp: 30 capsule, Rfl: 5 .  ezetimibe (ZETIA) 10 MG tablet, TAKE ONE TABLET BY MOUTH DAILY, Disp: 90 tablet, Rfl: 3 .  famciclovir (FAMVIR) 500 MG tablet, Take 1 tablet (500 mg total) by mouth daily., Disp: 30 tablet, Rfl: 12 .  fluticasone (FLONASE) 50 MCG/ACT nasal spray, INHALE ONE SPRAY INTO EACH NOSTRIL TWICE DAILY., Disp: 16 g, Rfl: 6 .  furosemide (LASIX) 40 MG tablet, Take 3 tablets (120 mg total) 2 (two) times daily by mouth., Disp: 180 tablet, Rfl: 5 .  glimepiride (AMARYL) 2 MG tablet, TAKE 1 TABLET (2 MG TOTAL) BY MOUTH DAILY WITH BREAKFAST., Disp: 90 tablet, Rfl: 0 .  glucose 5 g chewable tablet, Chew 15 g by mouth as needed for low blood sugar., Disp: , Rfl:  .  glucose blood (FREESTYLE LITE) test strip, TEST BLOOD SUGAR bid, Disp: 100 each, Rfl: 11 .  Lancets (FREESTYLE) lancets, Use BID to check blood sugar.  DX E11.9, Disp: 100 each, Rfl: 6 .  lidocaine-prilocaine (EMLA) cream, Apply to affected area once (Patient taking differently: Apply 1 application topically as needed (to port). ), Disp: 30 g, Rfl: 3 .  lipase/protease/amylase (CREON) 36000 UNITS CPEP capsule, Take 2 capsules (72,000 Units total) by mouth 3 (three) times daily with meals. 1 capsule with snack., Disp: 210 capsule, Rfl: 2 .  LORazepam (ATIVAN) 0.5 MG tablet, Take 1 tablet (0.5 mg total) by mouth every 6 (six) hours as needed (Nausea or vomiting)., Disp: 30  tablet, Rfl: 0 .  Menthol, Topical Analgesic, (ICY HOT EX), Apply 1 application topically daily as needed (muscle pain)., Disp: , Rfl:  .  metFORMIN (GLUCOPHAGE) 1000 MG tablet, TAKE ONE AND ONE-HALF TABLETS BY MOUTH EVERY MORNING AND 1 TALBET IN THE EVENING., Disp: 225 tablet, Rfl: 1 .  metoprolol tartrate (LOPRESSOR) 25 MG tablet, TAKE ONE-HALF TABLET BY MOUTH TWICE DAILY (Patient taking differently: TAKE ONE-HALF TABLET BY MOUTH IN THE MORNING AND HALF TABLET IN THE EVENING), Disp: 90 tablet, Rfl: 3 .  nitroGLYCERIN (NITROSTAT) 0.4 MG SL tablet, Place 1 tablet (0.4 mg total) under the tongue every 5 (five) minutes as needed for chest pain., Disp: 25 tablet, Rfl: 3 .  ondansetron (ZOFRAN) 8 MG tablet, Take 1 tablet (8  mg total) by mouth 2 (two) times daily as needed for refractory nausea / vomiting. Start on day 3 after cyclophosphamide chemotherapy., Disp: 30 tablet, Rfl: 1 .  polycarbophil (FIBERCON) 625 MG tablet, Take 625 mg by mouth daily., Disp: , Rfl:  .  potassium chloride SA (KLOR-CON M20) 20 MEQ tablet, Take 2 tablets (40 mEq total) 2 (two) times daily by mouth., Disp: 120 tablet, Rfl: 5 .  predniSONE (DELTASONE) 20 MG tablet, Take 3 pills a day, with food, for 5 days. Start the day after each chemotherapy, Disp: 120 tablet, Rfl: 0 .  pregabalin (LYRICA) 75 MG capsule, Take 1 capsule (75 mg total) by mouth 3 (three) times daily., Disp: 270 capsule, Rfl: 3 .  prochlorperazine (COMPAZINE) 10 MG tablet, Take 1 tablet (10 mg total) by mouth every 6 (six) hours as needed (Nausea or vomiting)., Disp: 30 tablet, Rfl: 6 No current facility-administered medications for this visit.   Facility-Administered Medications Ordered in Other Visits:  .  sodium chloride flush (NS) 0.9 % injection 10 mL, 10 mL, Intravenous, PRN, [name], MD, 10 mL at 05/30/17 0920  Allergies  Allergen Reactions  . Benazepril Swelling    angioedema; he is not a candidate for any angiotensin receptor blockers  because of this significant allergic reaction. Because of a history of documented adverse serious drug reaction;Medi Alert bracelet  is recommended  . Hctz [Hydrochlorothiazide] Anaphylaxis and Swelling    Tongue and lip swelling   . Aspirin Other (See Comments)    Gastritis, cant take 325 Mg aspirin   . Lactose Intolerance (Gi) Nausea And Vomiting   Review of Systems  Constitutional: Positive for fatigue.  Respiratory: Positive for shortness of breath.   All other systems reviewed and are negative.  Objective:   Vitals:   08/14/17 1522  BP: 130/83  Pulse: 76  Resp: 16    General: Well developed, nourished, in no acute distress, alert and oriented x3   Dermatological: Skin is warm, dry and supple bilateral. Nails x 10 are well maintained; remaining integument appears unremarkable at this time. There are no open sores, no preulcerative lesions, no rash or signs of infection present.  Sharply incurvated nail margins with erythema and edema along the tibiofibular border of the hallux left.  The nail plate appears to be relatively loose there is mild purulence no malodor.  Moderately tender on palpation.  Vascular: Dorsalis Pedis artery and Posterior Tibial artery pedal pulses are 2/4 bilateral with immedate capillary fill time. Pedal hair growth present. No varicosities and no lower extremity edema present bilateral.   Neruologic: Grossly intact via light touch bilateral. Vibratory intact via tuning fork bilateral. Protective threshold with Semmes Wienstein monofilament intact to all pedal sites bilateral. Patellar and Achilles deep tendon reflexes 2+ bilateral. No Babinski or clonus noted bilateral.   Musculoskeletal: No gross boney pedal deformities bilateral. No pain, crepitus, or limitation noted with foot and ankle range of motion bilateral. Muscular strength 5/5 in all groups tested bilateral.  Gait: Unassisted, Nonantalgic.    Radiographs:  None taken  Assessment & Plan:     Assessment: Paronychia hallux left  Plan: We discussed in great detail today the fact that we cannot perform a chemical matrixectomy on him.  He is about to have open heart surgery with replacement of aortic valve.  At this point with his toe as inflamed as it is I feel it is necessary to rid it of any source of infection that would prevent him from  having his upcoming surgery.  I do not feel that performing a matrixectomy which could possibly result in a further infection along the nail borders would be a smart move at this point.  I expressed to him that currently we will treat him to remove the infection allow the toe to heal prior to his surgery.  After surgery has taken place should his nail grow back and become painful again at that point we will go ahead and perform matrixectomy's.  He understands that and is amenable to it.  Today we performed a total nail avulsion because of the looseness of the nail this is performed after a 50-50 mixture of Marcaine plain lidocaine plain was infiltrated in a hallux block.  He was prepped and draped as normal sterile fashion.  The nail was avulsed all necrotic tissue was sharply resected dresser compressive dressing was applied he was given both oral and written home-going instruction for the care and soaking of his toe.  We will follow-up with him in 1-2 weeks to make sure is he is what healing well.     Demaree Liberto T. [place], [place]

## 2017-08-16 ENCOUNTER — Inpatient Hospital Stay
Admission: EM | Admit: 2017-08-16 | Discharge: 2017-08-21 | DRG: 871 | Disposition: A | Discharge status: Home / Self Care | Payer: Medicare Other | Attending: Internal Medicine | Admitting: Internal Medicine

## 2017-08-16 ENCOUNTER — Emergency Department: Payer: Medicare Other

## 2017-08-16 DIAGNOSIS — I5033 Acute on chronic diastolic (congestive) heart failure: Secondary | ICD-10-CM

## 2017-08-16 DIAGNOSIS — Z79899 Other long term (current) drug therapy: Secondary | ICD-10-CM

## 2017-08-16 DIAGNOSIS — K219 Gastro-esophageal reflux disease without esophagitis: Secondary | ICD-10-CM | POA: Diagnosis present

## 2017-08-16 DIAGNOSIS — K644 Residual hemorrhoidal skin tags: Secondary | ICD-10-CM | POA: Diagnosis present

## 2017-08-16 DIAGNOSIS — K921 Melena: Secondary | ICD-10-CM | POA: Diagnosis present

## 2017-08-16 DIAGNOSIS — Z9842 Cataract extraction status, left eye: Secondary | ICD-10-CM

## 2017-08-16 DIAGNOSIS — D61810 Antineoplastic chemotherapy induced pancytopenia: Secondary | ICD-10-CM | POA: Diagnosis present

## 2017-08-16 DIAGNOSIS — D709 Neutropenia, unspecified: Secondary | ICD-10-CM | POA: Diagnosis not present

## 2017-08-16 DIAGNOSIS — A419 Sepsis, unspecified organism: Principal | ICD-10-CM | POA: Diagnosis present

## 2017-08-16 DIAGNOSIS — E860 Dehydration: Secondary | ICD-10-CM | POA: Diagnosis present

## 2017-08-16 DIAGNOSIS — D638 Anemia in other chronic diseases classified elsewhere: Secondary | ICD-10-CM | POA: Diagnosis present

## 2017-08-16 DIAGNOSIS — J9600 Acute respiratory failure, unspecified whether with hypoxia or hypercapnia: Secondary | ICD-10-CM

## 2017-08-16 DIAGNOSIS — E876 Hypokalemia: Secondary | ICD-10-CM | POA: Diagnosis not present

## 2017-08-16 DIAGNOSIS — D62 Acute posthemorrhagic anemia: Secondary | ICD-10-CM | POA: Diagnosis present

## 2017-08-16 DIAGNOSIS — R5081 Fever presenting with conditions classified elsewhere: Secondary | ICD-10-CM | POA: Diagnosis present

## 2017-08-16 DIAGNOSIS — K922 Gastrointestinal hemorrhage, unspecified: Secondary | ICD-10-CM | POA: Diagnosis present

## 2017-08-16 DIAGNOSIS — I340 Nonrheumatic mitral (valve) insufficiency: Secondary | ICD-10-CM | POA: Diagnosis not present

## 2017-08-16 DIAGNOSIS — D701 Agranulocytosis secondary to cancer chemotherapy: Secondary | ICD-10-CM | POA: Diagnosis present

## 2017-08-16 DIAGNOSIS — R000 Tachycardia, unspecified: Secondary | ICD-10-CM | POA: Diagnosis not present

## 2017-08-16 DIAGNOSIS — E782 Mixed hyperlipidemia: Secondary | ICD-10-CM | POA: Diagnosis present

## 2017-08-16 DIAGNOSIS — Z7984 Long term (current) use of oral hypoglycemic drugs: Secondary | ICD-10-CM

## 2017-08-16 DIAGNOSIS — Z961 Presence of intraocular lens: Secondary | ICD-10-CM | POA: Diagnosis present

## 2017-08-16 DIAGNOSIS — N323 Diverticulum of bladder: Secondary | ICD-10-CM | POA: Diagnosis not present

## 2017-08-16 DIAGNOSIS — M109 Gout, unspecified: Secondary | ICD-10-CM | POA: Diagnosis present

## 2017-08-16 DIAGNOSIS — I2510 Atherosclerotic heart disease of native coronary artery without angina pectoris: Secondary | ICD-10-CM | POA: Diagnosis present

## 2017-08-16 DIAGNOSIS — B999 Unspecified infectious disease: Secondary | ICD-10-CM | POA: Diagnosis not present

## 2017-08-16 DIAGNOSIS — I2489 Other forms of acute ischemic heart disease: Secondary | ICD-10-CM

## 2017-08-16 DIAGNOSIS — D649 Anemia, unspecified: Secondary | ICD-10-CM | POA: Diagnosis not present

## 2017-08-16 DIAGNOSIS — I350 Nonrheumatic aortic (valve) stenosis: Secondary | ICD-10-CM | POA: Diagnosis present

## 2017-08-16 DIAGNOSIS — Z951 Presence of aortocoronary bypass graft: Secondary | ICD-10-CM

## 2017-08-16 DIAGNOSIS — R6521 Severe sepsis with septic shock: Secondary | ICD-10-CM | POA: Diagnosis not present

## 2017-08-16 DIAGNOSIS — C8332 Diffuse large B-cell lymphoma, intrathoracic lymph nodes: Secondary | ICD-10-CM | POA: Diagnosis present

## 2017-08-16 DIAGNOSIS — K649 Unspecified hemorrhoids: Secondary | ICD-10-CM | POA: Diagnosis not present

## 2017-08-16 DIAGNOSIS — N179 Acute kidney failure, unspecified: Secondary | ICD-10-CM | POA: Diagnosis present

## 2017-08-16 DIAGNOSIS — J9690 Respiratory failure, unspecified, unspecified whether with hypoxia or hypercapnia: Secondary | ICD-10-CM | POA: Diagnosis not present

## 2017-08-16 DIAGNOSIS — Z85828 Personal history of other malignant neoplasm of skin: Secondary | ICD-10-CM

## 2017-08-16 DIAGNOSIS — R195 Other fecal abnormalities: Secondary | ICD-10-CM

## 2017-08-16 DIAGNOSIS — Z9981 Dependence on supplemental oxygen: Secondary | ICD-10-CM | POA: Diagnosis not present

## 2017-08-16 DIAGNOSIS — E538 Deficiency of other specified B group vitamins: Secondary | ICD-10-CM | POA: Diagnosis present

## 2017-08-16 DIAGNOSIS — I259 Chronic ischemic heart disease, unspecified: Secondary | ICD-10-CM | POA: Diagnosis not present

## 2017-08-16 DIAGNOSIS — K5900 Constipation, unspecified: Secondary | ICD-10-CM | POA: Diagnosis not present

## 2017-08-16 DIAGNOSIS — I2582 Chronic total occlusion of coronary artery: Secondary | ICD-10-CM | POA: Diagnosis present

## 2017-08-16 DIAGNOSIS — E1165 Type 2 diabetes mellitus with hyperglycemia: Secondary | ICD-10-CM | POA: Diagnosis present

## 2017-08-16 DIAGNOSIS — J9601 Acute respiratory failure with hypoxia: Secondary | ICD-10-CM | POA: Diagnosis present

## 2017-08-16 DIAGNOSIS — Z955 Presence of coronary angioplasty implant and graft: Secondary | ICD-10-CM

## 2017-08-16 DIAGNOSIS — I4892 Unspecified atrial flutter: Secondary | ICD-10-CM | POA: Diagnosis not present

## 2017-08-16 DIAGNOSIS — I248 Other forms of acute ischemic heart disease: Secondary | ICD-10-CM | POA: Diagnosis not present

## 2017-08-16 DIAGNOSIS — R0602 Shortness of breath: Secondary | ICD-10-CM | POA: Diagnosis not present

## 2017-08-16 DIAGNOSIS — G4733 Obstructive sleep apnea (adult) (pediatric): Secondary | ICD-10-CM | POA: Diagnosis present

## 2017-08-16 DIAGNOSIS — Z7982 Long term (current) use of aspirin: Secondary | ICD-10-CM

## 2017-08-16 DIAGNOSIS — R309 Painful micturition, unspecified: Secondary | ICD-10-CM | POA: Diagnosis not present

## 2017-08-16 DIAGNOSIS — Z6833 Body mass index (BMI) 33.0-33.9, adult: Secondary | ICD-10-CM

## 2017-08-16 DIAGNOSIS — T451X5A Adverse effect of antineoplastic and immunosuppressive drugs, initial encounter: Secondary | ICD-10-CM | POA: Diagnosis present

## 2017-08-16 DIAGNOSIS — R0609 Other forms of dyspnea: Secondary | ICD-10-CM

## 2017-08-16 DIAGNOSIS — G894 Chronic pain syndrome: Secondary | ICD-10-CM | POA: Diagnosis present

## 2017-08-16 DIAGNOSIS — D61818 Other pancytopenia: Secondary | ICD-10-CM

## 2017-08-16 DIAGNOSIS — I110 Hypertensive heart disease with heart failure: Secondary | ICD-10-CM | POA: Diagnosis present

## 2017-08-16 DIAGNOSIS — R531 Weakness: Secondary | ICD-10-CM | POA: Diagnosis not present

## 2017-08-16 DIAGNOSIS — Z9841 Cataract extraction status, right eye: Secondary | ICD-10-CM

## 2017-08-16 DIAGNOSIS — C8590 Non-Hodgkin lymphoma, unspecified, unspecified site: Secondary | ICD-10-CM | POA: Diagnosis not present

## 2017-08-16 DIAGNOSIS — I483 Typical atrial flutter: Secondary | ICD-10-CM

## 2017-08-16 DIAGNOSIS — J810 Acute pulmonary edema: Secondary | ICD-10-CM | POA: Diagnosis not present

## 2017-08-16 DIAGNOSIS — K625 Hemorrhage of anus and rectum: Secondary | ICD-10-CM | POA: Diagnosis not present

## 2017-08-16 DIAGNOSIS — R197 Diarrhea, unspecified: Secondary | ICD-10-CM | POA: Diagnosis not present

## 2017-08-16 LAB — URINALYSIS, COMPLETE (UACMP) WITH MICROSCOPIC
BACTERIA UA: NONE SEEN
Bilirubin Urine: NEGATIVE
GLUCOSE, UA: NEGATIVE mg/dL
Hgb urine dipstick: NEGATIVE
KETONES UR: NEGATIVE mg/dL
Leukocytes, UA: NEGATIVE
NITRITE: NEGATIVE
PROTEIN: 30 mg/dL — AB
Specific Gravity, Urine: 1.012 (ref 1.005–1.030)
pH: 6 (ref 5.0–8.0)

## 2017-08-16 LAB — COMPREHENSIVE METABOLIC PANEL
ALBUMIN: 3.5 g/dL (ref 3.5–5.0)
ALT: 12 U/L — AB (ref 17–63)
ANION GAP: 11 (ref 5–15)
AST: 26 U/L (ref 15–41)
Alkaline Phosphatase: 66 U/L (ref 38–126)
BILIRUBIN TOTAL: 1.1 mg/dL (ref 0.3–1.2)
BUN: 23 mg/dL — AB (ref 6–20)
CALCIUM: 7.7 mg/dL — AB (ref 8.9–10.3)
CO2: 20 mmol/L — AB (ref 22–32)
Chloride: 101 mmol/L (ref 101–111)
Creatinine, Ser: 1.41 mg/dL — ABNORMAL HIGH (ref 0.61–1.24)
GFR calc Af Amer: 53 mL/min — ABNORMAL LOW (ref >60)
GFR calc non Af Amer: 46 mL/min — ABNORMAL LOW (ref >60)
GLUCOSE: 267 mg/dL — AB (ref 65–99)
Potassium: 4.5 mmol/L (ref 3.5–5.1)
SODIUM: 132 mmol/L — AB (ref 135–145)
TOTAL PROTEIN: 6 g/dL — AB (ref 6.5–8.1)

## 2017-08-16 LAB — DIFFERENTIAL
BAND NEUTROPHILS: 0 %
BASOS PCT: 3 %
BLASTS: 0 %
Basophils Absolute: 0 10*3/uL (ref 0–0.1)
EOS ABS: 0 10*3/uL (ref 0–0.7)
EOS PCT: 6 %
Lymphocytes Relative: 33 %
Lymphs Abs: 0.1 10*3/uL — ABNORMAL LOW (ref 1.0–3.6)
MONOS PCT: 52 %
Metamyelocytes Relative: 0 %
Monocytes Absolute: 0.2 10*3/uL (ref 0.2–1.0)
Myelocytes: 0 %
NEUTROS ABS: 0 10*3/uL — AB (ref 1.4–6.5)
NEUTROS PCT: 6 %
NRBC: 0 /100{WBCs}
OTHER: 0 %
Promyelocytes Absolute: 0 %

## 2017-08-16 LAB — PROTIME-INR
INR: 1.27
Prothrombin Time: 15.8 seconds — ABNORMAL HIGH (ref 11.4–15.2)

## 2017-08-16 LAB — LIPASE, BLOOD: Lipase: 17 U/L (ref 11–51)

## 2017-08-16 LAB — FIBRIN DERIVATIVES D-DIMER (ARMC ONLY): Fibrin derivatives D-dimer (ARMC): 1479.81 ng/mL (FEU) — ABNORMAL HIGH (ref 0.00–499.00)

## 2017-08-16 LAB — LACTIC ACID, PLASMA
LACTIC ACID, VENOUS: 3.1 mmol/L — AB (ref 0.5–1.9)
Lactic Acid, Venous: 4.1 mmol/L (ref 0.5–1.9)

## 2017-08-16 LAB — TROPONIN I: TROPONIN I: 0.37 ng/mL — AB (ref <0.03)

## 2017-08-16 LAB — FIBRINOGEN: FIBRINOGEN: 538 mg/dL — AB (ref 210–475)

## 2017-08-16 LAB — PROCALCITONIN: Procalcitonin: 2.22 ng/mL

## 2017-08-16 MED ORDER — ALLOPURINOL 300 MG PO TABS
450.0000 mg | ORAL_TABLET | Freq: Every day | ORAL | Status: DC
  Administered 2017-08-17 – 2017-08-21 (×5): 450 mg via ORAL
  Filled 2017-08-16 (×5): qty 1

## 2017-08-16 MED ORDER — PANTOPRAZOLE SODIUM 40 MG PO TBEC
40.0000 mg | DELAYED_RELEASE_TABLET | Freq: Every day | ORAL | Status: DC
  Administered 2017-08-17 – 2017-08-21 (×5): 40 mg via ORAL
  Filled 2017-08-16 (×5): qty 1

## 2017-08-16 MED ORDER — DIPHENHYDRAMINE HCL 25 MG PO CAPS
25.0000 mg | ORAL_CAPSULE | Freq: Once | ORAL | Status: DC
  Filled 2017-08-16: qty 1

## 2017-08-16 MED ORDER — SODIUM CHLORIDE 0.9 % IV BOLUS (SEPSIS): 250.0000 mL | Freq: Once | INTRAVENOUS | Status: DC

## 2017-08-16 MED ORDER — PREGABALIN 75 MG PO CAPS
75.0000 mg | ORAL_CAPSULE | Freq: Three times a day (TID) | ORAL | Status: DC
  Administered 2017-08-17 – 2017-08-21 (×13): 75 mg via ORAL
  Filled 2017-08-16 (×13): qty 1

## 2017-08-16 MED ORDER — ACETAMINOPHEN 325 MG PO TABS
650.0000 mg | ORAL_TABLET | Freq: Once | ORAL | Status: DC
  Filled 2017-08-16: qty 2

## 2017-08-16 MED ORDER — VALACYCLOVIR HCL 500 MG PO TABS
1000.0000 mg | ORAL_TABLET | Freq: Every day | ORAL | Status: DC
  Administered 2017-08-17 – 2017-08-21 (×5): 1000 mg via ORAL
  Filled 2017-08-16 (×5): qty 2

## 2017-08-16 MED ORDER — ACETAMINOPHEN 325 MG PO TABS
650.0000 mg | ORAL_TABLET | Freq: Four times a day (QID) | ORAL | Status: DC | PRN
Start: 2017-08-16 — End: 2017-08-17
  Administered 2017-08-17: 650 mg via ORAL
  Filled 2017-08-16: qty 2

## 2017-08-16 MED ORDER — HYDROCODONE-ACETAMINOPHEN 5-325 MG PO TABS
1.0000 | ORAL_TABLET | ORAL | Status: DC | PRN
  Administered 2017-08-17 (×2): 2 via ORAL
  Administered 2017-08-17 (×2): 1 via ORAL
  Administered 2017-08-18 – 2017-08-21 (×10): 2 via ORAL
  Filled 2017-08-16 (×12): qty 2
  Filled 2017-08-16 (×2): qty 1

## 2017-08-16 MED ORDER — PROCHLORPERAZINE MALEATE 10 MG PO TABS
10.0000 mg | ORAL_TABLET | Freq: Four times a day (QID) | ORAL | Status: DC | PRN
  Filled 2017-08-16: qty 1

## 2017-08-16 MED ORDER — NITROGLYCERIN 0.4 MG SL SUBL: 0.4000 mg | SUBLINGUAL_TABLET | SUBLINGUAL | Status: DC | PRN

## 2017-08-16 MED ORDER — LORAZEPAM 0.5 MG PO TABS: 0.5000 mg | ORAL_TABLET | Freq: Four times a day (QID) | ORAL | Status: DC | PRN

## 2017-08-16 MED ORDER — INSULIN ASPART 100 UNIT/ML ~~LOC~~ SOLN: 0.0000 [IU] | Freq: Every day | SUBCUTANEOUS | Status: DC

## 2017-08-16 MED ORDER — VANCOMYCIN HCL IN DEXTROSE 1-5 GM/200ML-% IV SOLN
1000.0000 mg | Freq: Once | INTRAVENOUS | Status: AC
  Administered 2017-08-16: 1000 mg via INTRAVENOUS
  Filled 2017-08-16: qty 200

## 2017-08-16 MED ORDER — DIPHENOXYLATE-ATROPINE 2.5-0.025 MG PO TABS: 1.0000 | ORAL_TABLET | Freq: Four times a day (QID) | ORAL | Status: DC | PRN

## 2017-08-16 MED ORDER — CALCIUM POLYCARBOPHIL 625 MG PO TABS
625.0000 mg | ORAL_TABLET | Freq: Every day | ORAL | Status: DC
  Administered 2017-08-17 – 2017-08-21 (×5): 625 mg via ORAL
  Filled 2017-08-16 (×5): qty 1

## 2017-08-16 MED ORDER — SODIUM CHLORIDE 0.9 % IV BOLUS (SEPSIS)
1000.0000 mL | Freq: Once | INTRAVENOUS | Status: AC
  Administered 2017-08-16: 1000 mL via INTRAVENOUS

## 2017-08-16 MED ORDER — INSULIN ASPART 100 UNIT/ML ~~LOC~~ SOLN
0.0000 [IU] | Freq: Three times a day (TID) | SUBCUTANEOUS | Status: DC
  Administered 2017-08-17 – 2017-08-18 (×2): 3 [IU] via SUBCUTANEOUS
  Administered 2017-08-18: 4 [IU] via SUBCUTANEOUS
  Administered 2017-08-19 – 2017-08-21 (×3): 3 [IU] via SUBCUTANEOUS
  Filled 2017-08-16 (×6): qty 1

## 2017-08-16 MED ORDER — PANCRELIPASE (LIP-PROT-AMYL) 12000-38000 UNITS PO CPEP
72000.0000 [IU] | ORAL_CAPSULE | Freq: Three times a day (TID) | ORAL | Status: DC
  Administered 2017-08-17 – 2017-08-21 (×14): 72000 [IU] via ORAL
  Filled 2017-08-16 (×2): qty 6
  Filled 2017-08-16: qty 2
  Filled 2017-08-16 (×3): qty 6
  Filled 2017-08-16: qty 2
  Filled 2017-08-16 (×4): qty 6
  Filled 2017-08-16: qty 2
  Filled 2017-08-16 (×3): qty 6

## 2017-08-16 MED ORDER — SODIUM CHLORIDE 0.9 % IV SOLN: INTRAVENOUS | Status: DC

## 2017-08-16 MED ORDER — SODIUM CHLORIDE 0.9 % IV BOLUS (SEPSIS)
500.0000 mL | Freq: Once | INTRAVENOUS | Status: AC
  Administered 2017-08-16: 500 mL via INTRAVENOUS

## 2017-08-16 MED ORDER — ATORVASTATIN CALCIUM 20 MG PO TABS
80.0000 mg | ORAL_TABLET | Freq: Every day | ORAL | Status: DC
  Administered 2017-08-17 – 2017-08-20 (×4): 80 mg via ORAL
  Filled 2017-08-16 (×4): qty 4

## 2017-08-16 MED ORDER — ACETAMINOPHEN 500 MG PO TABS
1000.0000 mg | ORAL_TABLET | ORAL | Status: AC
  Administered 2017-08-16: 1000 mg via ORAL
  Filled 2017-08-16: qty 2

## 2017-08-16 MED ORDER — ACETAMINOPHEN 650 MG RE SUPP: 650.0000 mg | Freq: Four times a day (QID) | RECTAL | Status: DC | PRN

## 2017-08-16 MED ORDER — PROMETHAZINE HCL 12.5 MG PO TABS
12.5000 mg | ORAL_TABLET | Freq: Four times a day (QID) | ORAL | Status: DC | PRN
  Filled 2017-08-16: qty 1

## 2017-08-16 MED ORDER — LIDOCAINE HCL 2 % EX GEL
1.0000 "application " | Freq: Once | CUTANEOUS | Status: AC
  Administered 2017-08-16: 1 via TOPICAL
  Filled 2017-08-16: qty 10

## 2017-08-16 MED ORDER — ONDANSETRON HCL 4 MG PO TABS: 8.0000 mg | ORAL_TABLET | Freq: Two times a day (BID) | ORAL | Status: DC | PRN

## 2017-08-16 MED ORDER — FUROSEMIDE 10 MG/ML IJ SOLN
20.0000 mg | INTRAMUSCULAR | Status: DC | PRN
  Administered 2017-08-16 – 2017-08-17 (×3): 20 mg via INTRAVENOUS
  Filled 2017-08-16: qty 4
  Filled 2017-08-16 (×2): qty 2

## 2017-08-16 MED ORDER — PIPERACILLIN-TAZOBACTAM 3.375 G IVPB 30 MIN
3.3750 g | Freq: Once | INTRAVENOUS | Status: AC
  Administered 2017-08-16: 3.375 g via INTRAVENOUS
  Filled 2017-08-16: qty 50

## 2017-08-16 MED ORDER — GLIMEPIRIDE 2 MG PO TABS
2.0000 mg | ORAL_TABLET | Freq: Every day | ORAL | Status: DC
  Administered 2017-08-17 – 2017-08-21 (×4): 2 mg via ORAL
  Filled 2017-08-16 (×5): qty 1

## 2017-08-16 MED ORDER — FLUTICASONE PROPIONATE 50 MCG/ACT NA SUSP
1.0000 | Freq: Every day | NASAL | Status: DC
  Filled 2017-08-16 (×4): qty 16

## 2017-08-16 MED ORDER — HYDROCORTISONE ACETATE 25 MG RE SUPP
25.0000 mg | Freq: Two times a day (BID) | RECTAL | Status: DC
  Administered 2017-08-17 (×2): 25 mg via RECTAL
  Filled 2017-08-16 (×4): qty 1

## 2017-08-16 MED ORDER — LIDOCAINE-PRILOCAINE 2.5-2.5 % EX CREA: 1.0000 "application " | TOPICAL_CREAM | CUTANEOUS | Status: DC | PRN

## 2017-08-16 MED ORDER — EZETIMIBE 10 MG PO TABS
10.0000 mg | ORAL_TABLET | Freq: Every day | ORAL | Status: DC
  Administered 2017-08-17 – 2017-08-21 (×5): 10 mg via ORAL
  Filled 2017-08-16 (×5): qty 1

## 2017-08-16 NOTE — ED Triage Notes (Signed)
Pt c/o diarrhea, sob, x 2 days. Chemo pt.

## 2017-08-16 NOTE — ED Notes (Signed)
Code Sepsis called in to [facility] per Dr. Malachi Bonds orders.

## 2017-08-16 NOTE — ED Notes (Signed)
Cardiopulmonary tech to bedside to apply bipap

## 2017-08-16 NOTE — H&P (Signed)
[facility] NAME: Richard [name]    MR#:  [id number]  DATE OF BIRTH:  [birth year]  DATE OF ADMISSION:  08/16/2017  PRIMARY CARE PHYSICIAN: Carollee Herter, Alferd Apa, DO   REQUESTING/REFERRING PHYSICIAN:   CHIEF COMPLAINT:   Chief Complaint  Patient presents with  . Diarrhea    HISTORY OF PRESENT ILLNESS: Richard [name]  is a 77 y.o. male with a known history per below, B-cell lymphoma status post eighth round of chemo-last one was 8 days ago on Friday, currently under evaluation for TAVR, presents with 2-day history of fevers, acute on chronic worsening shortness of breath/dyspnea on exertion, chills and sweating over the last 2 days, decreased energy, fatigue, generalized weakness, fever noted today of 101.3 in the emergency room, bright red blood per rectum for the last 2-4 weeks, in the emergency room patient was found to be tachycardic, tachypneic, hypotensive, EKG showing atrial flutter with 3-1 AV block with heart rate of 90, creatinine 1.4 with baseline normal, hemoglobin 7.8 down from 9.8, platelet count 32, white blood cell count 1.3, CT abdomen noted for bladder diverticulosis/atherosclerosis, chest x-ray noted for cardiomegaly, and discussion with ED attending-concern for sepsis due to unknown etiology, sepsis protocol initiated, patient evaluated the bedside with multiple family members present, acute clinical picture more consistent with acute worsening symptomatic anemia due to subacute bright red blood per rectum/GI bleeding, acute possible sepsis versus neutropenic fever.  PAST MEDICAL HISTORY:   Past Medical History:  Diagnosis Date  . Anemia   . Axillary adenopathy 02/25/2017  . Bradycardia    a. holter monitor has demonstrated HRs in 30s and [place]   . CAD (coronary artery disease)    a. s/p CABG 2001  . Chronic lower back pain   . Diffuse large B cell lymphoma ([place])   . Diverticulosis   . Esophageal stricture   . GERD  (gastroesophageal reflux disease)   . History of gout   . HTN (hypertension)   . Mixed hyperlipidemia   . OSA on CPAP    with 2L O2 at night  . Pancytopenia ([place])    a. related to chemo therapy for B cell lymphoma  . Peptic stricture of esophagus   . Severe aortic stenosis   . Spinal stenosis   . Type II diabetes mellitus ([place])     PAST SURGICAL HISTORY:  Past Surgical History:  Procedure Laterality Date  . APPENDECTOMY  ~ 1952  . BACK SURGERY    . CATARACT EXTRACTION W/PHACO Left 11/06/2016   Procedure: CATARACT EXTRACTION PHACO AND INTRAOCULAR LENS PLACEMENT (IOC);  Surgeon: Estill Cotta, MD;  Location: [facility] ORS;  Service: Ophthalmology;  Laterality: Left;  Lot # [id number] [place]: 01:09.4 AP%:25.2 CDE: 30.64  . CATARACT EXTRACTION W/PHACO Right 12/04/2016   Procedure: CATARACT EXTRACTION PHACO AND INTRAOCULAR LENS PLACEMENT (IOC);  Surgeon: Estill Cotta, MD;  Location: [facility] OR[facility];  Service: Ophthalmology;  Laterality: Right;  [place] 1:25.9 AP% 24.1 CDE 39.10 Fluid Pack lot # [id number]  . COLONOSCOPY W/ BIOPSIES AND POLYPECTOMY  2013  . CORONARY ANGIOPLASTY  1993  . CORONARY ANGIOPLASTY WITH STENT PLACEMENT  05/1997   "1"  . CORONARY ARTERY BYPASS GRAFT  03/2000   "CABG X5"  . ESOPHAGEAL DILATION  X 3-4   Dr. Lyla Son; "last one was in the 1990's"  . ESOPHAGOGASTRODUODENOSCOPY     multiple  . FLEXIBLE SIGMOIDOSCOPY     multiple  . HYDRADENITIS EXCISION Left 02/25/2017  Procedure: EXCISION DEEP LEFT AXILLARY LYMPH NODE;  Surgeon: Fanny Skates, MD;  Location: [facility];  Service: General;  Laterality: Left;  . KNEE ARTHROSCOPY Left 2011   meniscus repair  . LEFT HEART CATHETERIZATION WITH CORONARY/GRAFT ANGIOGRAM N/A 03/16/2014   Procedure: LEFT HEART CATHETERIZATION WITH Beatrix Fetters;  Surgeon: Burnell Blanks, MD;  Location: [facility] CATH LAB;  Service: Cardiovascular;  Laterality: N/A;  . LUMBAR LAMINECTOMY/DECOMPRESSION MICRODISCECTOMY Right 06/17/2013    Procedure: LUMBAR LAMINECTOMY MICRODISCECTOMY L4-L5 RIGHT EXCISION OF SYNOVIAL CYST RIGHT   (1 LEVEL) RIGHT PARTIAL FACETECTOMY;  Surgeon: Tobi Bastos, MD;  Location: [facility];  Service: Orthopedics;  Laterality: Right;  . MYELOGRAM  04/06/13   lumbar, Dr Gladstone Lighter  . PANENDOSCOPY    . PORTACATH PLACEMENT N/A 03/06/2017   Procedure: INSERTION PORT-A-CATH AND ASPIRATE SEROMA LEFT AXILLA;  Surgeon: Fanny Skates, MD;  Location: [facility];  Service: General;  Laterality: N/A;  . RIGHT/LEFT HEART CATH AND CORONARY/GRAFT ANGIOGRAPHY N/A 07/28/2017   Procedure: RIGHT/LEFT HEART CATH AND CORONARY/GRAFT ANGIOGRAPHY;  Surgeon: Sherren Mocha, MD;  Location: [facility] LAB;  Service: Cardiovascular;  Laterality: N/A;  . SHOULDER SURGERY Right 08/2010   screws placed; "tendons tore off"  . SKIN CANCER EXCISION Right    "neck"  . TONSILLECTOMY  ~ 1954  . UPPER GI ENDOSCOPY  2013   Gastritis; Dr Carlean Purl  . VASECTOMY      SOCIAL HISTORY:  Social History   Tobacco Use  . Smoking status: Never Smoker  . Smokeless tobacco: Never Used  Substance Use Topics  . Alcohol use: No    Alcohol/week: 0.0 oz    Comment: "last drink was in 2012"( 03/15/2014)    FAMILY HISTORY:  Family History  Problem Relation Age of Onset  . Stroke Father   . Hypertension Father   . Pancreatic cancer Mother   . Diabetes Maternal Grandmother   . Stroke Maternal Grandmother   . Heart attack Paternal Grandmother   . Colon cancer Neg Hx   . Esophageal cancer Neg Hx   . Rectal cancer Neg Hx   . Stomach cancer Neg Hx   . Ulcers Neg Hx     DRUG ALLERGIES:  Allergies  Allergen Reactions  . Benazepril Swelling    angioedema; he is not a candidate for any angiotensin receptor blockers because of this significant allergic reaction. Because of a history of documented adverse serious drug reaction;Medi Alert bracelet  is recommended  . Hctz [Hydrochlorothiazide] Anaphylaxis and Swelling    Tongue and lip swelling   .  Aspirin Other (See Comments)    Gastritis, cant take 325 Mg aspirin   . Lactose Intolerance (Gi) Nausea And Vomiting    REVIEW OF SYSTEMS:   CONSTITUTIONAL: + fever, fatigue, gen weakness.  EYES: No blurred or double vision.  EARS, NOSE, AND THROAT: No tinnitus or ear pain.  RESPIRATORY: No cough, + chronic shortness of breath, no wheezing or hemoptysis.  CARDIOVASCULAR:  Chronic chest pain w/ exertion, no  orthopnea, edema.  GASTROINTESTINAL: + nausea, no vomiting, + diarrhea, nor abdominal pain.  Positive rectal pain, hemorrhoids GENITOURINARY: No dysuria, hematuria.  ENDOCRINE: No polyuria, nocturia,  HEMATOLOGY: No anemia, easy bruising or bleeding SKIN: No rash or lesion. MUSCULOSKELETAL: No joint pain or arthritis.   NEUROLOGIC: No tingling, numbness, weakness.  PSYCHIATRY: No anxiety or depression.   MEDICATIONS AT HOME:  Prior to Admission medications   Medication Sig Start Date End Date Taking? Authorizing Provider  allopurinol (ZYLOPRIM) 300 MG  tablet Take 450 mg by mouth daily.     [provider]  amLODipine (NORVASC) 5 MG tablet TAKE 2 TABLETS (10 MG TOTAL) BY MOUTH DAILY. Patient taking differently: TAKE 5 MG BY MOUTH TWICE DAILY. 01/15/17   Minus Breeding, MD  aspirin 81 MG tablet Take 81 mg by mouth daily.    [provider]  atorvastatin (LIPITOR) 80 MG tablet TAKE ONE TABLET BY MOUTH AT BEDTIME 01/29/17   Minus Breeding, MD  diphenoxylate-atropine (LOMOTIL) 2.5-0.025 MG tablet Take 2 pills at the onset of diarrhea, then take one pill after every loose stool. Maximum 8 pills per 24h. 04/04/17   [name], MD  doxycycline (VIBRA-TABS) 100 MG tablet Take 1 tablet (100 mg total) by mouth 2 (two) times daily. 08/14/17   Hyatt, Max T, DPM  esomeprazole (NEXIUM) 40 MG capsule Take 1 capsule (40 mg total) by mouth daily. 10/15/12   [name], MD  ezetimibe (ZETIA) 10 MG tablet TAKE ONE TABLET BY MOUTH DAILY 01/15/17   Minus Breeding, MD   famciclovir (FAMVIR) 500 MG tablet Take 1 tablet (500 mg total) by mouth daily. 02/27/17   [name], MD  fluticasone (FLONASE) 50 MCG/ACT nasal spray INHALE ONE SPRAY INTO EACH NOSTRIL TWICE DAILY. 05/05/17   [name], DO  furosemide (LASIX) 40 MG tablet Take 3 tablets (120 mg total) 2 (two) times daily by mouth. 05/13/17   Almyra Deforest, PA  glimepiride (AMARYL) 2 MG tablet TAKE 1 TABLET (2 MG TOTAL) BY MOUTH DAILY WITH BREAKFAST. 05/16/17   Roma Schanz R, DO  glucose 5 g chewable tablet Chew 15 g by mouth as needed for low blood sugar.    [provider]  glucose blood (FREESTYLE LITE) test strip TEST BLOOD SUGAR bid 04/03/17   Carollee Herter, Alferd Apa, DO  Lancets (FREESTYLE) lancets Use BID to check blood sugar.  DX E11.9 04/03/17   [name], DO  lidocaine-prilocaine (EMLA) cream Apply to affected area once Patient taking differently: Apply 1 application topically as needed (to port).  03/04/17   [name], MD  lipase/protease/amylase (CREON) 36000 UNITS CPEP capsule Take 2 capsules (72,000 Units total) by mouth 3 (three) times daily with meals. 1 capsule with snack. 05/30/17   [place], Holli Humbles, NP  LORazepam (ATIVAN) 0.5 MG tablet Take 1 tablet (0.5 mg total) by mouth every 6 (six) hours as needed (Nausea or vomiting). 03/04/17   [name], MD  Menthol, Topical Analgesic, (ICY HOT EX) Apply 1 application topically daily as needed (muscle pain).    [provider]  metFORMIN (GLUCOPHAGE) 1000 MG tablet TAKE ONE AND ONE-HALF TABLETS BY MOUTH EVERY MORNING AND 1 TALBET IN THE EVENING. 02/24/17   Carollee Herter, Alferd Apa, DO  metoprolol tartrate (LOPRESSOR) 25 MG tablet TAKE ONE-HALF TABLET BY MOUTH TWICE DAILY Patient taking differently: TAKE ONE-HALF TABLET BY MOUTH IN THE MORNING AND HALF TABLET IN THE EVENING 01/15/17   Minus Breeding, MD  NEOMYCIN-POLYMYXIN-HYDROCORTISONE (CORTISPORIN) 1 % SOLN OTIC solution Apply 1-2 drops to toe BID  after soaking 08/14/17   Hyatt, Max T, DPM  nitroGLYCERIN (NITROSTAT) 0.4 MG SL tablet Place 1 tablet (0.4 mg total) under the tongue every 5 (five) minutes as needed for chest pain. 09/18/16   Minus Breeding, MD  ondansetron (ZOFRAN) 8 MG tablet Take 1 tablet (8 mg total) by mouth 2 (two) times daily as needed for refractory nausea / vomiting. Start on day 3  after cyclophosphamide chemotherapy. 03/04/17   [name], MD  polycarbophil (FIBERCON) 625 MG tablet Take 625 mg by mouth daily.    [provider]  potassium chloride SA (KLOR-CON M20) 20 MEQ tablet Take 2 tablets (40 mEq total) 2 (two) times daily by mouth. 05/13/17   Almyra Deforest, PA  pregabalin (LYRICA) 75 MG capsule Take 1 capsule (75 mg total) by mouth 3 (three) times daily. 04/03/17   [name], DO  prochlorperazine (COMPAZINE) 10 MG tablet Take 1 tablet (10 mg total) by mouth every 6 (six) hours as needed (Nausea or vomiting). 03/04/17   [name], MD      PHYSICAL EXAMINATION:   VITAL SIGNS: Blood pressure 115/76, pulse 95, temperature (!) 101.3 F (38.5 C), resp. rate 20, weight 112.5 kg (248 lb), SpO2 94 %.  GENERAL:  77 y.o.-year-old patient lying in the bed with no acute distress.  Morbid obesity EYES: Pupils equal, round, reactive to light and accommodation. No scleral icterus. Extraocular muscles intact.  HEENT: Head atraumatic, normocephalic. Oropharynx and nasopharynx clear.  Dry mucous membranes NECK:  Supple, no jugular venous distention. No thyroid enlargement, no tenderness.  Poor skin turgor LUNGS: Normal breath sounds bilaterally, no wheezing, rales,rhonchi or crepitation. No use of accessory muscles of respiration.  CARDIOVASCULAR: S1, S2 normal. No murmurs, rubs, or gallops.  ABDOMEN: Soft, nontender, nondistended. Bowel sounds present. No organomegaly or mass.  Nonthrombosed external hemorrhoid, tenderness to palpation EXTREMITIES: No pedal edema, cyanosis, or clubbing.  NEUROLOGIC:  Cranial nerves II through XII are intact. Muscle strength 5/5 in all extremities. Sensation intact. Gait not checked.  PSYCHIATRIC: The patient is alert and oriented x 3.  SKIN: No obvious rash, lesion, or ulcer.   LABORATORY PANEL:   CBC Recent Labs  Lab 08/16/17 1829  WBC 0.3*  HGB 7.8*  HCT 24.5*  PLT 32*  MCV 87.3  MCH 27.8  MCHC 31.8*  RDW 19.8*   ------------------------------------------------------------------------------------------------------------------  Chemistries  Recent Labs  Lab 08/16/17 1829  NA 132*  K 4.5  CL 101  CO2 20*  GLUCOSE 267*  BUN 23*  CREATININE 1.41*  CALCIUM 7.7*  AST 26  ALT 12*  ALKPHOS 66  BILITOT 1.1   ------------------------------------------------------------------------------------------------------------------ estimated creatinine clearance is 55 mL/min (A) (by C-G formula based on SCr of 1.41 mg/dL (H)). ------------------------------------------------------------------------------------------------------------------ No results for input(s): TSH, T4TOTAL, T3FREE, THYROIDAB in the last 72 hours.  Invalid input(s): FREET3   Coagulation profile Recent Labs  Lab 08/16/17 1829  INR 1.27   ------------------------------------------------------------------------------------------------------------------- No results for input(s): DDIMER in the last 72 hours. -------------------------------------------------------------------------------------------------------------------  Cardiac Enzymes Recent Labs  Lab 08/16/17 1829  TROPONINI 0.37*   ------------------------------------------------------------------------------------------------------------------ Invalid input(s): POCBNP  ---------------------------------------------------------------------------------------------------------------  Urinalysis    Component Value Date/Time   COLORURINE YELLOW (A) 08/16/2017 2014   APPEARANCEUR CLEAR (A) 08/16/2017 2014    LABSPEC 1.012 08/16/2017 2014   PHURINE 6.0 08/16/2017 2014   [place] NEGATIVE 08/16/2017 2014   HGBUR NEGATIVE 08/16/2017 2014   HGBUR negative 05/29/2010 1100   BILIRUBINUR NEGATIVE 08/16/2017 2014   BILIRUBINUR neg 03/20/2015 [place] 08/16/2017 2014   PROTEINUR 30 (A) 08/16/2017 2014   UROBILINOGEN 0.2 03/20/2015 1454   UROBILINOGEN 0.2 10/06/2014 1010   NITRITE NEGATIVE 08/16/2017 2014   LEUKOCYTESUR NEGATIVE 08/16/2017 2014     RADIOLOGY: Dg Chest Portable 1 View  Result Date: 08/16/2017 CLINICAL DATA:  Diarrhea, shortness of breath for 2 days. EXAM: PORTABLE CHEST 1 VIEW COMPARISON:  07/23/2017 FINDINGS: The right chest wall port a catheter is noted with tip at the cavoatrial junction. Previous median sternotomy and CABG procedure. Aortic atherosclerosis noted. Stable moderate cardiac enlargement. No pleural effusion or edema. No airspace opacities identified. IMPRESSION: 1. Cardiac enlargement.  No acute findings. 2.  Aortic Atherosclerosis (ICD10-I70.0). Electronically Signed   By: Kerby Moors M.D.   On: 08/16/2017 19:20   Ct Renal Stone Study  Result Date: 08/16/2017 CLINICAL DATA:  Diarrhea and dyspnea x2 days. EXAM: CT ABDOMEN AND PELVIS WITHOUT CONTRAST TECHNIQUE: Multidetector CT imaging of the abdomen and pelvis was performed following the standard protocol without IV contrast. COMPARISON:  None. FINDINGS: Lower chest: Low-density appearance of the blood pool within the cardiac chambers with visualization of the interventricular septum compatible with anemia. Bibasilar dependent atelectasis. Small hiatal hernia. Hepatobiliary: No focal liver abnormality is seen. No gallstones, gallbladder wall thickening, or biliary dilatation. Pancreas: Unremarkable. No pancreatic ductal dilatation or surrounding inflammatory changes. Spleen: Normal in size without focal abnormality. Adrenals/Urinary Tract: Normal bilateral adrenal glands. No nephrolithiasis nor obstructive  uropathy. Physiologic distention of the urinary bladder without focal mural thickening or calculus. Several small bladder diverticula are noted along the dome and posterior wall. Stomach/Bowel: Stomach is within normal limits. Appendix is surgically absent by report. No evidence of bowel wall thickening, distention, or inflammatory changes. Vascular/Lymphatic: Moderate aortoiliac atherosclerosis without aneurysm. Atherosclerotic calcifications at the origins of both renal arteries without significant renal cortical scarring. Mild ectasia of the right common iliac artery to 2.2 cm. No pelvic sidewall, inguinal nor abdominal adenopathy. Reproductive: Mild enlargement of the prostate impressing upon the base of the bladder measuring up to 5.9 cm craniocaudad. Normal seminal vesicles. Other: No free air nor free fluid. Musculoskeletal: No acute nor suspicious osseous abnormalities. IMPRESSION: 1. Visualization of the interventricular septum with hypodense appearance of the blood pool compatible with anemia. 2. Bladder diverticulosis. 3. No acute bowel inflammation or inflammation. 4. Moderate aortoiliac and branch vessel atherosclerosis. Electronically Signed   By: Ashley Royalty M.D.   On: 08/16/2017 20:24    EKG: Orders placed or performed during the hospital encounter of 08/16/17  . ED EKG 12-Lead  . ED EKG 12-Lead    IMPRESSION AND PLAN: 1 acute on chronic symptomatic worsening anemia/pancytopenia Secondary to acute on subacute GI bleeding, exacerbated by chemotherapy for lymphoma Suspect due to diverticular disease and/or hemorrhoidal disease Admit to regular nursing floor bed, gastroenterology for expert opinion, colonoscopy to 3 years ago was unimpressive/diverticular disease only, PPI daily, clear liquid diet for now, transfuse 2 units packed red blood cells, 3 pack of platelets, CBC daily, consult oncology for expert opinion, strict I&O monitoring, neutropenic precautions, and continue close medical  monitoring  2 acute BRBPR/GIB Plan of care as stated above  3 acute elevated troponins/possible non-STEMI Consult cardiology for expert opinion, avoid anticoagulants given acute blood loss anemia, antihypertensives on hold given relative hypotension, nitrates as needed, supplemental oxygen as needed, IV morphine as needed breakthrough pain, cycle set of cardiac enzymes, blood transfusion as stated above, and continue close medical monitoring Heart catheterization from January 2019 : - Severe native three-vessel coronary artery disease with moderate distal left main stenosis, total occlusion of the LAD, total occlusion of the ramus intermedius, total occlusion of the first OM, and total occlusion of the RCA. -Status post aortocoronary bypass surgery with continued patency of the RIMA to PDA, LIMA to LAD, and sequential saphenous vein graft to ramus intermedius and first OM branches of the circumflex. -Chronic  occlusion of the saphenous vein graft to diagonal, the diagonal branch is collateralized by the apical LAD -Severe calcific aortic stenosis with a mean transvalvular gradient of 51 mmHg  4 acute possible sepsis Continue sepsis protocol, empiric vancomycin/Zosyn with pharmacy to dose, follow-up on cultures Could also be related to neutropenic fever in setting of symptomatic worsening pancytopenia/anemia-plan of care as stated above  5 acute kidney injury Baseline renal function normal Exacerbated by acute blood loss anemia Avoid nephrotoxic agents, strict I&O monitoring, daily weights, BMP daily, hold Lasix given acute dehydrated state  6 acute dehydration Exacerbated by acute blood loss anemia and dehydration IV fluids for rehydration and blood transfusion as stated above  7 morbid obesity Most likely secondary to excess calories Lifestyle modification recommended  8 chronic obstructive sleep apnea Stable CPAP at bedtime  9 chronic diabetes mellitus type 2 Hold metformin,  sliding scale insulin with Accu-Cheks per routine  10 acute symptomatic hemorrhoidal disease, nonthrombosed Anusol twice daily for 5-day course  Full code Condition stable Prognosis poor DVT prophylaxis with SCDs Disposition pending clinical course       All the records are reviewed and case discussed with ED provider. Management plans discussed with the patient, family and they are in agreement.  CODE STATUS: Code Status History    Date Active Date Inactive Code Status Order ID Comments User Context   07/24/2017 00:36 07/29/2017 17:58 Full Code [id number]  Doylene Canning, MD ED   03/16/2014 17:41 03/17/2014 16:26 Full Code [id number]  Burnell Blanks, MD Inpatient   03/15/2014 19:55 03/16/2014 17:41 Full Code [id number]  Consuelo Pandy, PA-C Inpatient   06/17/2013 16:49 06/21/2013 17:00 Full Code [id number]  Tobi Bastos, MD Inpatient       TOTAL TIME TAKING CARE OF THIS PATIENT: 45 minutes of critical care time was used to discuss plan of care with subspecialty/ED staff, ancillary staff, the patient, the patient's multiple family members with all questions answered.    Avel Peace Pegge Cumberledge M.D on 08/16/2017   Between 7am to 6pm - Pager - [phone]  After 6pm go to [email or web address] - password [email or web address] [facility]  Office  [phone]  CC: Primary care physician; [name], DO   Note: This dictation was prepared with Dragon dictation along with smaller phrase technology. Any transcriptional errors that result from this process are unintentional.

## 2017-08-16 NOTE — ED Notes (Signed)
[id number] Admitting MD paged due to pt dropping his O2 sats.

## 2017-08-16 NOTE — ED Provider Notes (Signed)
[name] Provider Note   ____________________________________________   First MD Initiated Contact with Patient 08/16/17 1819     (approximate)  I have reviewed the triage vital signs and the nursing notes.   HISTORY  Chief Complaint Diarrhea And weakness  HPI [name] is a 77 y.o. male the history of multiple medical problems including recent hospitalization, recent toe infection on doxycycline, lymphoma, severe aortic stenosis.  Patient reports recent the on antibiotics doxycycline for an infection about his left toenail which was removed.  Also recent admission to the hospital.  History of recent low platelet count.  Patient is been having some loose stools for the last 1-2 days, also noticed he is having uncomfortable hemorrhoid near his rectum.  No nausea or vomiting.  Reports shortness of breath with walking at baseline, no significant change today.  No chest pain.  No nausea or vomiting.  Family reports they were concerned that he was having increasing weakness today, feeling generally unwell.  Currently on chemotherapy.  Recent infection about the left toe.  Denies pain except for discomfort with urination and pain at a hemorrhoid which has come up in the last day.  Feels urgency to urinate at present.   Past Medical History:  Diagnosis Date  . Anemia   . Axillary adenopathy 02/25/2017  . Bradycardia    a. holter monitor has demonstrated HRs in 30s and [place]ach   . CAD (coronary artery disease)    a. s/p CABG 2001  . Chronic lower back pain   . Diffuse large B cell lymphoma ([place])   . Diverticulosis   . Esophageal stricture   . GERD (gastroesophageal reflux disease)   . History of gout   . HTN (hypertension)   . Mixed hyperlipidemia   . OSA on CPAP    with 2L O2 at night  . Pancytopenia ([place])    a. related to chemo therapy for B cell lymphoma  . Peptic stricture of esophagus   . Severe aortic stenosis   .  Spinal stenosis   . Type II diabetes mellitus [name])     Patient Active Problem List   Diagnosis Date Noted  . Hyperlipidemia 08/05/2017  . NSTEMI (non-ST elevated myocardial infarction) ([place])   . Aortic stenosis, severe 07/25/2017  . Pancytopenia ([place]) 07/24/2017  . 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 ([place]) 02/27/2017  . Bradycardia 01/04/2017  . Coronary artery disease 01/04/2017  . Stenosis of carotid artery 01/04/2017  . Obesity 09/18/2016  . Wenckebach block   . Spinal stenosis, lumbar region, with neurogenic claudication 06/17/2013  . DM (diabetes mellitus) type II uncontrolled, periph vascular disorder ([place]) 05/21/2013  . Spinal stenosis of lumbar region 04/08/2013  . Anemia, iron deficiency 11/29/2011  . B12 deficiency anemia 07/30/2011  . OSA (obstructive sleep apnea) 07/06/2010  . CAD, ARTERY BYPASS GRAFT 08/22/2009  . Essential hypertension 03/29/2009  . Bilateral lower extremity edema 02/21/2009  . Hyperlipidemia LDL goal <70 11/26/2008    Past Surgical History:  Procedure Laterality Date  . APPENDECTOMY  ~ 1952  . BACK SURGERY    . CATARACT EXTRACTION W/PHACO Left 11/06/2016   Procedure: CATARACT EXTRACTION PHACO AND INTRAOCULAR LENS PLACEMENT (IOC);  Surgeon: Estill Cotta, MD;  Location: [facility] ORS;  Service: Ophthalmology;  Laterality: Left;  Lot # [id number] H [place]: 01:09.4 AP%:25.2 CDE: 30.64  . CATARACT EXTRACTION W/PHACO Right 12/04/2016   Procedure: CATARACT EXTRACTION PHACO AND INTRAOCULAR LENS PLACEMENT (IOC);  Surgeon: Estill Cotta,  MD;  Location: [facility] ORS;  Service: Ophthalmology;  Laterality: Right;  [place] 1:25.9 AP% 24.1 CDE 39.10 Fluid Pack lot # [id number]  . COLONOSCOPY W/ BIOPSIES AND POLYPECTOMY  2013  . CORONARY ANGIOPLASTY  1993  . CORONARY ANGIOPLASTY WITH STENT PLACEMENT  05/1997   "1"  . CORONARY ARTERY BYPASS GRAFT  03/2000   "CABG X5"  . ESOPHAGEAL DILATION  X 3-4   Dr. Lyla Son; "last one was in the 1990's"  .  ESOPHAGOGASTRODUODENOSCOPY     multiple  . FLEXIBLE SIGMOIDOSCOPY     multiple  . HYDRADENITIS EXCISION Left 02/25/2017   Procedure: EXCISION DEEP LEFT AXILLARY LYMPH NODE;  Surgeon: Fanny Skates, MD;  Location: [place];  Service: General;  Laterality: Left;  . KNEE ARTHROSCOPY Left 2011   meniscus repair  . LEFT HEART CATHETERIZATION WITH CORONARY/GRAFT ANGIOGRAM N/A 03/16/2014   Procedure: LEFT HEART CATHETERIZATION WITH Beatrix Fetters;  Surgeon: Burnell Blanks, MD;  Location: [facility] CATH LAB;  Service: Cardiovascular;  Laterality: N/A;  . LUMBAR LAMINECTOMY/DECOMPRESSION MICRODISCECTOMY Right 06/17/2013   Procedure: LUMBAR LAMINECTOMY MICRODISCECTOMY L4-L5 RIGHT EXCISION OF SYNOVIAL CYST RIGHT   (1 LEVEL) RIGHT PARTIAL FACETECTOMY;  Surgeon: Tobi Bastos, MD;  Location: [facility];  Service: Orthopedics;  Laterality: Right;  . MYELOGRAM  04/06/13   lumbar, Dr Gladstone Lighter  . PANENDOSCOPY    . PORTACATH PLACEMENT N/A 03/06/2017   Procedure: INSERTION PORT-A-CATH AND ASPIRATE SEROMA LEFT AXILLA;  Surgeon: Fanny Skates, MD;  Location: [place];  Service: General;  Laterality: N/A;  . RIGHT/LEFT HEART CATH AND CORONARY/GRAFT ANGIOGRAPHY N/A 07/28/2017   Procedure: RIGHT/LEFT HEART CATH AND CORONARY/GRAFT ANGIOGRAPHY;  Surgeon: Sherren Mocha, MD;  Location: [place] CV LAB;  Service: Cardiovascular;  Laterality: N/A;  . SHOULDER SURGERY Right 08/2010   screws placed; "tendons tore off"  . SKIN CANCER EXCISION Right    "neck"  . TONSILLECTOMY  ~ 1954  . UPPER GI ENDOSCOPY  2013   Gastritis; Dr Carlean Purl  . VASECTOMY      Prior to Admission medications   Medication Sig Start Date End Date Taking? Authorizing Provider  allopurinol (ZYLOPRIM) 300 MG tablet Take 450 mg by mouth daily.     [provider]  amLODipine (NORVASC) 5 MG tablet TAKE 2 TABLETS (10 MG TOTAL) BY MOUTH DAILY. Patient taking differently: TAKE 5 MG BY MOUTH TWICE DAILY. 01/15/17   Minus Breeding, MD    amoxicillin-clavulanate (AUGMENTIN) 875-125 MG tablet Take 1 tablet by mouth 2 (two) times daily. 07/18/17   [name], MD  aspirin 81 MG tablet Take 81 mg by mouth daily.    [provider]  atorvastatin (LIPITOR) 80 MG tablet TAKE ONE TABLET BY MOUTH AT BEDTIME 01/29/17   Minus Breeding, MD  diphenoxylate-atropine (LOMOTIL) 2.5-0.025 MG tablet Take 2 pills at the onset of diarrhea, then take one pill after every loose stool. Maximum 8 pills per 24h. 04/04/17   [name], MD  doxycycline (VIBRA-TABS) 100 MG tablet Take 1 tablet (100 mg total) by mouth 2 (two) times daily. 08/14/17   Hyatt, Max T, DPM  esomeprazole (NEXIUM) 40 MG capsule Take 1 capsule (40 mg total) by mouth daily. 10/15/12   [name], MD  ezetimibe (ZETIA) 10 MG tablet TAKE ONE TABLET BY MOUTH DAILY 01/15/17   Minus Breeding, MD  famciclovir (FAMVIR) 500 MG tablet Take 1 tablet (500 mg total) by mouth daily. 02/27/17   [name], MD  fluticasone (FLONASE) 50 MCG/ACT nasal  spray INHALE ONE SPRAY INTO EACH NOSTRIL TWICE DAILY. 05/05/17   [name], DO  furosemide (LASIX) 40 MG tablet Take 3 tablets (120 mg total) 2 (two) times daily by mouth. 05/13/17   Almyra Deforest, PA  glimepiride (AMARYL) 2 MG tablet TAKE 1 TABLET (2 MG TOTAL) BY MOUTH DAILY WITH BREAKFAST. 05/16/17   Roma Schanz R, DO  glucose 5 g chewable tablet Chew 15 g by mouth as needed for low blood sugar.    [provider]  glucose blood (FREESTYLE LITE) test strip TEST BLOOD SUGAR bid 04/03/17   Carollee Herter, Alferd Apa, DO  Lancets (FREESTYLE) lancets Use BID to check blood sugar.  DX E11.9 04/03/17   [name], DO  lidocaine-prilocaine (EMLA) cream Apply to affected area once Patient taking differently: Apply 1 application topically as needed (to port).  03/04/17   [name], MD  lipase/protease/amylase (CREON) 36000 UNITS CPEP capsule Take 2 capsules (72,000 Units total) by mouth 3 (three) times  daily with meals. 1 capsule with snack. 05/30/17   [place], Holli Humbles, NP  LORazepam (ATIVAN) 0.5 MG tablet Take 1 tablet (0.5 mg total) by mouth every 6 (six) hours as needed (Nausea or vomiting). 03/04/17   [name], MD  Menthol, Topical Analgesic, (ICY HOT EX) Apply 1 application topically daily as needed (muscle pain).    [provider]  metFORMIN (GLUCOPHAGE) 1000 MG tablet TAKE ONE AND ONE-HALF TABLETS BY MOUTH EVERY MORNING AND 1 TALBET IN THE EVENING. 02/24/17   Carollee Herter, Alferd Apa, DO  metoprolol tartrate (LOPRESSOR) 25 MG tablet TAKE ONE-HALF TABLET BY MOUTH TWICE DAILY Patient taking differently: TAKE ONE-HALF TABLET BY MOUTH IN THE MORNING AND HALF TABLET IN THE EVENING 01/15/17   Minus Breeding, MD  NEOMYCIN-POLYMYXIN-HYDROCORTISONE (CORTISPORIN) 1 % SOLN OTIC solution Apply 1-2 drops to toe BID after soaking 08/14/17   Hyatt, Max T, DPM  nitroGLYCERIN (NITROSTAT) 0.4 MG SL tablet Place 1 tablet (0.4 mg total) under the tongue every 5 (five) minutes as needed for chest pain. 09/18/16   Minus Breeding, MD  ondansetron (ZOFRAN) 8 MG tablet Take 1 tablet (8 mg total) by mouth 2 (two) times daily as needed for refractory nausea / vomiting. Start on day 3 after cyclophosphamide chemotherapy. 03/04/17   [name], MD  polycarbophil (FIBERCON) 625 MG tablet Take 625 mg by mouth daily.    [provider]  potassium chloride SA (KLOR-CON M20) 20 MEQ tablet Take 2 tablets (40 mEq total) 2 (two) times daily by mouth. 05/13/17   Almyra Deforest, PA  predniSONE (DELTASONE) 20 MG tablet Take 3 pills a day, with food, for 5 days. Start the day after each chemotherapy 02/27/17   [name], MD  pregabalin (LYRICA) 75 MG capsule Take 1 capsule (75 mg total) by mouth 3 (three) times daily. 04/03/17   [name], DO  prochlorperazine (COMPAZINE) 10 MG tablet Take 1 tablet (10 mg total) by mouth every 6 (six) hours as needed (Nausea or vomiting). 03/04/17   [name], MD    Allergies Benazepril; Hctz [hydrochlorothiazide]; Aspirin; and Lactose intolerance (gi)  Family History  Problem Relation Age of Onset  . Stroke Father   . Hypertension Father   . Pancreatic cancer Mother   . Diabetes Maternal Grandmother   . Stroke Maternal Grandmother   . Heart attack Paternal Grandmother   . Colon cancer Neg Hx   . Esophageal cancer Neg  Hx   . Rectal cancer Neg Hx   . Stomach cancer Neg Hx   . Ulcers Neg Hx     Social History Social History   Tobacco Use  . Smoking status: Never Smoker  . Smokeless tobacco: Never Used  Substance Use Topics  . Alcohol use: No    Alcohol/week: 0.0 oz    Comment: "last drink was in 2012"( 03/15/2014)  . Drug use: No    Review of Systems Constitutional: Subjective fever and chills, increased fatigue Eyes: No visual changes. ENT: No sore throat. Cardiovascular: Denies chest pain. Respiratory: Denies shortness of breath at present, but reports anytime he goes to walk or move for the last several weeks she has had shortness of breath that seems to be stable.  Reports this is due to the need for replacement of his aortic valve which we are evaluating for present. Gastrointestinal: No abdominal pain.  No constipation. Genitourinary: Negative for dysuria.  See HPI Musculoskeletal: Negative for back pain. Skin: Negative for rash. Neurological: Negative for headaches, focal weakness or numbness.    ____________________________________________   PHYSICAL EXAM:  VITAL SIGNS: ED Triage Vitals  Enc Vitals Group     BP 08/16/17 1758 106/90     Pulse Rate 08/16/17 1758 90     Resp 08/16/17 1758 16     Temp 08/16/17 1758 98.6 F (37 C)     Temp Source 08/16/17 1758 Oral     SpO2 08/16/17 1758 94 %     Weight --      Height --      Head Circumference --      Peak Flow --      Pain Score 08/16/17 1759 10     Pain Loc --      Pain Edu? --      Excl. in [place]? --     Constitutional: Alert and oriented.   Moderately ill-appearing.  Slightly diaphoretic and feels warm.  He is in no acute distress.  Very pleasant with granddaughter and daughter at bedside Eyes: Conjunctivae are normal. Head: Atraumatic. Nose: No congestion/rhinnorhea. Mouth/Throat: Mucous membranes are dry. Neck: No stridor.  No meningismus Cardiovascular: Normal rate, regular rhythm.  A strongly noted systolic murmur.  Good peripheral circulation. Respiratory: Slight tachypnea, otherwise normal respiratory effort.  No retractions. Lungs CTAB. Gastrointestinal: Soft and nontender. No distention. Rectal exam: Patient has a small external hemorrhoid, tender to palpation.  No active bleeding from the hemorrhoid, stool is brown and formed, but is guaiac positive Musculoskeletal: No lower extremity tenderness nor edema.  He has had a previously removed left great toenail, the toe and left foot do not demonstrate erythema.  No crepitance.  No ulcer is noted.  No evidence of superinfection is noted at present.  Right lower foot no evidence of infection.  Back examined, old lumbar surgical site.  No evidence of erythema or skin breakdown or infection.  Neurologic:  Normal speech and language. No gross focal neurologic deficits are appreciated.  Skin:  Skin is warm, dry and intact. No rash noted. Psychiatric: Mood and affect are normal. Speech and behavior are normal.  ____________________________________________   LABS (all labs ordered are listed, but only abnormal results are displayed)  Labs Reviewed  CBC - Abnormal; Notable for the following components:      Result Value   WBC 0.3 (*)    RBC 2.80 (*)    Hemoglobin 7.8 (*)    HCT 24.5 (*)    MCHC 31.8 (*)  RDW 19.8 (*)    Platelets 32 (*)    All other components within normal limits  COMPREHENSIVE METABOLIC PANEL - Abnormal; Notable for the following components:   Sodium 132 (*)    CO2 20 (*)    Glucose, Bld 267 (*)    BUN 23 (*)    Creatinine, Ser 1.41 (*)     Calcium 7.7 (*)    Total Protein 6.0 (*)    ALT 12 (*)    GFR calc non Af Amer 46 (*)    GFR calc Af Amer 53 (*)    All other components within normal limits  URINALYSIS, COMPLETE (UACMP) WITH MICROSCOPIC - Abnormal; Notable for the following components:   Color, Urine YELLOW (*)    APPearance CLEAR (*)    Protein, ur 30 (*)    Squamous Epithelial / LPF 0-5 (*)    All other components within normal limits  LACTIC ACID, PLASMA - Abnormal; Notable for the following components:   Lactic Acid, Venous 4.1 (*)    All other components within normal limits  LACTIC ACID, PLASMA - Abnormal; Notable for the following components:   Lactic Acid, Venous 3.1 (*)    All other components within normal limits  TROPONIN I - Abnormal; Notable for the following components:   Troponin I 0.37 (*)    All other components within normal limits  FIBRIN DERIVATIVES D-DIMER ([facility] ONLY) - Abnormal; Notable for the following components:   Fibrin derivatives D-dimer ([facility]) 1,479.81 (*)    All other components within normal limits  CULTURE, BLOOD (ROUTINE X 2)  CULTURE, BLOOD (ROUTINE X 2)  URINE CULTURE  LIPASE, BLOOD  FIBRINOGEN  PROTIME-INR  CBC WITH DIFFERENTIAL/PLATELET   ____________________________________________  EKG  Reviewed, appears atrial flutter.  Rate controlled.  Normal rate.  No evidence of acute ischemic changes as noted. ____________________________________________  RADIOLOGY  CT abdomen pelvis reviewed by me findings reported as hypodense appearance of the septum of the heart possible anemia Bladder diverticulosis No acute findings otherwise noted  ____________________________________________   PROCEDURES  Procedure(s) performed: None  Procedures  Critical Care performed: Yes, see critical care note(s)  CRITICAL CARE Performed by: Delman Kitten   Total critical care time: 50 minutes  Critical care time was exclusive of separately billable procedures and treating other  patients.  Critical care was necessary to treat or prevent imminent or life-threatening deterioration.  Critical care was time spent personally by me on the following activities: development of treatment plan with patient and/or surrogate as well as nursing, discussions with consultants, evaluation of patient's response to treatment, examination of patient, obtaining history from patient or surrogate, ordering and performing treatments and interventions, ordering and review of laboratory studies, ordering and review of radiographic studies, pulse oximetry and re-evaluation of patient's condition.  ____________________________________________   INITIAL IMPRESSION / ASSESSMENT AND PLAN / ED COURSE  Pertinent labs & imaging results that were available during my care of the patient were reviewed by me and considered in my medical decision making (see chart for details).  Patient presents with a myriad of concerns, including fever, fatigue, diarrhea, hemorrhoid.  He is neutropenic, actively on chemo, febrile.  He very high suspicion for bacteremia especially given his recent placement on doxycycline for an infected left toe bed.  Upon review of his labs, he has pancytopenia, has had previous, but it is somewhat acutely worsened.  Given his recent infection of the left toe on doxycycline this raises concern for bacteremia.  Also neutropenia.  Denies upper respiratory infection or pulmonary symptoms.  No cardiac chest pain.  Elevated troponin felt to be likely due to demand ischemia.  He has thrombocytopenia which is a contraindication to giving antiplatelet therapy and also concern for occult GI bleeding.  The patient has a multitude of findings that are concerning for possible sepsis, septic shock with possible DIC.  I am also concerned about his anemia, though I suspect this may be secondary to different etiology such as severe sepsis.  Patient is notable critical illness regarding his labs, need of  further extensive workup.  No alteration in mental status, I do not believe he is suffering from TTP.  He does have occult GI bleeding, but no gross bleeding.  Does not appear to have major evidence of any hemorrhage at this time, but will need very close follow-up as inpatient.  Clinical Course as of Aug 16 2133  Sat Aug 16, 2017  1917 Hemoglobin: (!) 7.8 [MQ]  2725 Platelets: (!) 32 [MQ]  1935 Code Sepsis paged  [MQ]  1938 Lactic acid critically elevated.  Code sepsis page.  I have started a small fluid bolus, at this point. The patient has known severe aortic stenosis, is currently normotensive, and will monitor closely for volume overload.  [MQ]  2025 I'm concerned for severe sepsis and septic shock. Consider DIC (pending fibrinogen, INR, etc.) Admit to hospital. Guarded condition. Concern for sepsis with shock, continue to monitor closely after fluid boluses.  [MQ]  2029 Sepsis reassessment. Roughly 500 ml fluids completed now. BP 101/60, RR 22, HR 80s. Reports slight improvement, temp now 99.6. Appears slightly improved.  [MQ]  2121 Ongoing ED care has been handed over to the hospitalist service Dr. Jerelyn Charles.  I have updated Dr. Mable [place] as well, patient is pending admission at this time.  Currently awake and alert, concern is highly elevated for neutropenic fever and possible severe sepsis/possible shock.  Multiple laboratory abnormalities, DIC panel is pending.  Patient presently hemodynamically stable receiving additional fluid boluses for concerns of sepsis.  Bolusing sepsis fluids based on ideal body weight.  Antibiotics given.   Had a rather extensive conversation with Dr. Jerelyn Charles the hospitalist about the ongoing need for further workup.  Both in agreement patient with multiple laboratory modalities, concern for sepsis is elevated, empiric antibiotics so no clear source.  In addition Dr. Jerelyn Charles noting potential concern for symptomatic anemia.  Further discussed, Dr. Jerelyn Charles will see the  patient, further workup under his service shortly.     [MQ]    Clinical Course User Index [MQ] Delman Kitten, MD   I updated the patient and his family, concerned that he has some very critically abnormal laboratory findings.  I conveyed concern for critical illness and severity of his illness today.  Patient and his family including daughter understanding.  Initiating broad-spectrum antibiotics, admitting to the hospitalist for further workup which I anticipate to be likely very extensive.  Discussed with hospitalist Dr. Jerelyn Charles, additional IV fluids ordered for treatment of severe sepsis.  Lactate is downtrending at this time.  Patient hemodynamics remained stable.  Patient will be monitored extremely closely, ongoing care per the hospitalist service.  ____________________________________________   FINAL CLINICAL IMPRESSION(S) / ED DIAGNOSES  Final diagnoses:  Severe sepsis with septic shock (HCC)  Pancytopenia (HCC)  Neutropenic fever (HCC)  Occult GI bleeding  Demand ischemia of myocardium (HCC)  Exertional dyspnea  External hemorrhoid      NEW MEDICATIONS STARTED DURING THIS VISIT:  New Prescriptions  No medications on file     Note:  This document was prepared using Dragon voice recognition software and may include unintentional dictation errors.     Delman Kitten, MD 08/16/17 2136

## 2017-08-17 ENCOUNTER — Encounter: Payer: Self-pay | Admitting: General Practice

## 2017-08-17 ENCOUNTER — Inpatient Hospital Stay: Payer: Medicare Other

## 2017-08-17 ENCOUNTER — Other Ambulatory Visit: Payer: Self-pay | Admitting: Family Medicine

## 2017-08-17 ENCOUNTER — Other Ambulatory Visit: Payer: Self-pay

## 2017-08-17 DIAGNOSIS — A419 Sepsis, unspecified organism: Principal | ICD-10-CM

## 2017-08-17 DIAGNOSIS — I350 Nonrheumatic aortic (valve) stenosis: Secondary | ICD-10-CM

## 2017-08-17 DIAGNOSIS — R6521 Severe sepsis with septic shock: Secondary | ICD-10-CM

## 2017-08-17 DIAGNOSIS — K922 Gastrointestinal hemorrhage, unspecified: Secondary | ICD-10-CM

## 2017-08-17 DIAGNOSIS — R5081 Fever presenting with conditions classified elsewhere: Secondary | ICD-10-CM

## 2017-08-17 DIAGNOSIS — D61818 Other pancytopenia: Secondary | ICD-10-CM

## 2017-08-17 DIAGNOSIS — I248 Other forms of acute ischemic heart disease: Secondary | ICD-10-CM

## 2017-08-17 DIAGNOSIS — I259 Chronic ischemic heart disease, unspecified: Secondary | ICD-10-CM

## 2017-08-17 DIAGNOSIS — J810 Acute pulmonary edema: Secondary | ICD-10-CM

## 2017-08-17 DIAGNOSIS — J9601 Acute respiratory failure with hypoxia: Secondary | ICD-10-CM

## 2017-08-17 DIAGNOSIS — I483 Typical atrial flutter: Secondary | ICD-10-CM

## 2017-08-17 DIAGNOSIS — D701 Agranulocytosis secondary to cancer chemotherapy: Secondary | ICD-10-CM

## 2017-08-17 DIAGNOSIS — R000 Tachycardia, unspecified: Secondary | ICD-10-CM

## 2017-08-17 DIAGNOSIS — K625 Hemorrhage of anus and rectum: Secondary | ICD-10-CM

## 2017-08-17 DIAGNOSIS — I5033 Acute on chronic diastolic (congestive) heart failure: Secondary | ICD-10-CM

## 2017-08-17 LAB — CBC WITH DIFFERENTIAL/PLATELET
Basophils Absolute: 0 10*3/uL (ref 0–0.1)
Basophils Relative: 2 %
EOS ABS: 0 10*3/uL (ref 0–0.7)
EOS PCT: 6 %
HCT: 26.1 % — ABNORMAL LOW (ref 40.0–52.0)
Hemoglobin: 8.4 g/dL — ABNORMAL LOW (ref 13.0–18.0)
LYMPHS ABS: 0.1 10*3/uL — AB (ref 1.0–3.6)
Lymphocytes Relative: 34 %
MCH: 28.2 pg (ref 26.0–34.0)
MCHC: 32.2 g/dL (ref 32.0–36.0)
MCV: 87.6 fL (ref 80.0–100.0)
Monocytes Absolute: 0.1 10*3/uL — ABNORMAL LOW (ref 0.2–1.0)
Monocytes Relative: 31 %
Neutro Abs: 0.1 10*3/uL — ABNORMAL LOW (ref 1.4–6.5)
Neutrophils Relative %: 27 %
Platelets: 38 10*3/uL — ABNORMAL LOW (ref 150–440)
RBC: 2.99 MIL/uL — AB (ref 4.40–5.90)
RDW: 18.8 % — ABNORMAL HIGH (ref 11.5–14.5)
WBC: 0.3 10*3/uL — AB (ref 3.8–10.6)

## 2017-08-17 LAB — COMPREHENSIVE METABOLIC PANEL
ALK PHOS: 55 U/L (ref 38–126)
ALT: 16 U/L — AB (ref 17–63)
ANION GAP: 9 (ref 5–15)
AST: 28 U/L (ref 15–41)
Albumin: 3.1 g/dL — ABNORMAL LOW (ref 3.5–5.0)
BUN: 20 mg/dL (ref 6–20)
CALCIUM: 7.3 mg/dL — AB (ref 8.9–10.3)
CO2: 20 mmol/L — ABNORMAL LOW (ref 22–32)
CREATININE: 1.33 mg/dL — AB (ref 0.61–1.24)
Chloride: 106 mmol/L (ref 101–111)
GFR, EST AFRICAN AMERICAN: 57 mL/min — AB (ref >60)
GFR, EST NON AFRICAN AMERICAN: 49 mL/min — AB (ref >60)
Glucose, Bld: 142 mg/dL — ABNORMAL HIGH (ref 65–99)
Potassium: 3.5 mmol/L (ref 3.5–5.1)
Sodium: 135 mmol/L (ref 135–145)
TOTAL PROTEIN: 5.7 g/dL — AB (ref 6.5–8.1)
Total Bilirubin: 1.2 mg/dL (ref 0.3–1.2)

## 2017-08-17 LAB — GLUCOSE, CAPILLARY
GLUCOSE-CAPILLARY: 154 mg/dL — AB (ref 65–99)
GLUCOSE-CAPILLARY: 198 mg/dL — AB (ref 65–99)
Glucose-Capillary: 134 mg/dL — ABNORMAL HIGH (ref 65–99)
Glucose-Capillary: 117 mg/dL — ABNORMAL HIGH (ref 65–99)
Glucose-Capillary: 85 mg/dL (ref 65–99)

## 2017-08-17 LAB — CBC
HEMATOCRIT: 24.5 % — AB (ref 40.0–52.0)
Hemoglobin: 7.8 g/dL — ABNORMAL LOW (ref 13.0–18.0)
MCH: 27.8 pg (ref 26.0–34.0)
MCHC: 31.8 g/dL — AB (ref 32.0–36.0)
MCV: 87.3 fL (ref 80.0–100.0)
Platelets: 32 10*3/uL — ABNORMAL LOW (ref 150–440)
RBC: 2.8 MIL/uL — AB (ref 4.40–5.90)
RDW: 19.8 % — ABNORMAL HIGH (ref 11.5–14.5)
WBC: 0.3 10*3/uL — CL (ref 3.8–10.6)

## 2017-08-17 LAB — TROPONIN I
TROPONIN I: 0.45 ng/mL — AB (ref <0.03)
TROPONIN I: 0.63 ng/mL — AB (ref <0.03)

## 2017-08-17 LAB — PROCALCITONIN: Procalcitonin: 14.62 ng/mL

## 2017-08-17 LAB — ABO/RH: ABO/RH(D): A POS

## 2017-08-17 LAB — MRSA PCR SCREENING: MRSA BY PCR: NEGATIVE

## 2017-08-17 LAB — MAGNESIUM: MAGNESIUM: 2 mg/dL (ref 1.7–2.4)

## 2017-08-17 LAB — PHOSPHORUS: Phosphorus: 3 mg/dL (ref 2.5–4.6)

## 2017-08-17 LAB — PREPARE RBC (CROSSMATCH)

## 2017-08-17 MED ORDER — PIPERACILLIN-TAZOBACTAM 3.375 G IVPB
3.3750 g | Freq: Three times a day (TID) | INTRAVENOUS | Status: DC
  Administered 2017-08-17 – 2017-08-21 (×13): 3.375 g via INTRAVENOUS
  Filled 2017-08-17 (×13): qty 50

## 2017-08-17 MED ORDER — VANCOMYCIN HCL 10 G IV SOLR
1250.0000 mg | Freq: Two times a day (BID) | INTRAVENOUS | Status: DC
  Administered 2017-08-17: 1250 mg via INTRAVENOUS
  Filled 2017-08-17 (×2): qty 1250

## 2017-08-17 MED ORDER — ORAL CARE MOUTH RINSE
15.0000 mL | Freq: Two times a day (BID) | OROMUCOSAL | Status: DC
  Administered 2017-08-20 – 2017-08-21 (×2): 15 mL via OROMUCOSAL

## 2017-08-17 MED ORDER — CHLORHEXIDINE GLUCONATE 0.12 % MT SOLN
15.0000 mL | Freq: Two times a day (BID) | OROMUCOSAL | Status: DC
  Administered 2017-08-17 – 2017-08-21 (×8): 15 mL via OROMUCOSAL
  Filled 2017-08-17 (×8): qty 15

## 2017-08-17 MED ORDER — VANCOMYCIN HCL 10 G IV SOLR
1250.0000 mg | INTRAVENOUS | Status: DC
  Administered 2017-08-17: 1250 mg via INTRAVENOUS
  Filled 2017-08-17 (×2): qty 1250

## 2017-08-17 MED ORDER — WITCH HAZEL-GLYCERIN EX PADS
MEDICATED_PAD | CUTANEOUS | Status: DC | PRN
  Administered 2017-08-17 – 2017-08-18 (×5): via TOPICAL
  Filled 2017-08-17: qty 100

## 2017-08-17 MED ORDER — HYDROMORPHONE HCL 1 MG/ML IJ SOLN
1.0000 mg | INTRAMUSCULAR | Status: DC | PRN
Start: 2017-08-17 — End: 2017-08-17

## 2017-08-17 NOTE — Progress Notes (Signed)
   Primary cardiologist : Dr. Percival Spanish  TAVR MDs: Dr. Roxy Manns, Dr. Billey Gosling to consult for NSTEMI.  Chart reviewed.  Patient with multiple stressors at this time including GI bleed, anemia, hypotension, malignancy in the setting of chronic CAD and significant aortic stenosis.    Troponin likely represents demand ischemia.  Prior cath showed 4/5 grafts patent.  1) Avoid anticoagulants. 2) Support BP and Hbg with transfusion. 3) Watch for signs of volume overload post transfusion.  May need some Lasix between units of PRBC.   Full consult to follow.   Jettie Booze, MD

## 2017-08-17 NOTE — Progress Notes (Signed)
Patient had not voided this shift, RN tried to get patient to void into urinal but patient unable to void. Bladder scanned patient and 511 mL in bladder. Patient reports no difficulty at home but stands to urinate. Patient unable to stand at this time due to oxygen demands and weakness. Notified Dr. Celesta Aver of situation and patient needing to be in and out catherized twice overnight. MD ordered foley catheter placement for acute urinary retention.

## 2017-08-17 NOTE — Progress Notes (Signed)
[facility] [place] NAME: Richard [name]    MR#:  [id number]  DATE OF BIRTH:  [birth year]  SUBJECTIVE:  CHIEF COMPLAINT:   Chief Complaint  Patient presents with  . Diarrhea    Came with fever, weakness, some blood in the stool.  Noted to be anemic, has recent chemotherapy and so his white blood cell count and platelets are also running low. Also has significant cardiac issues with valvular insufficiencies and plan is for valvular surgery recently. After IV fluids and some transfusion he was hypoxic and pulmonary edema so transferred to ICU and received some Lasix, and is much better this morning. Hemoglobin is stable. Bibasilar count and platelets are still low.  REVIEW OF SYSTEMS:  CONSTITUTIONAL: No fever, positive for fatigue or weakness.  EYES: No blurred or double vision.  EARS, NOSE, AND THROAT: No tinnitus or ear pain.  RESPIRATORY: No cough, shortness of breath, wheezing or hemoptysis.  CARDIOVASCULAR: No chest pain, orthopnea, edema.  GASTROINTESTINAL: No nausea, vomiting, diarrhea or abdominal pain. Blood in stool. GENITOURINARY: No dysuria, hematuria.  ENDOCRINE: No polyuria, nocturia,  HEMATOLOGY: No anemia, easy bruising or bleeding SKIN: No rash or lesion. MUSCULOSKELETAL: No joint pain or arthritis.   NEUROLOGIC: No tingling, numbness, weakness.  PSYCHIATRY: No anxiety or depression.   ROS  DRUG ALLERGIES:   Allergies  Allergen Reactions  . Benazepril Swelling    angioedema; he is not a candidate for any angiotensin receptor blockers because of this significant allergic reaction. Because of a history of documented adverse serious drug reaction;Medi Alert bracelet  is recommended  . Hctz [Hydrochlorothiazide] Anaphylaxis and Swelling    Tongue and lip swelling   . Aspirin Other (See Comments)    Gastritis, cant take 325 Mg aspirin   . Lactose Intolerance (Gi) Nausea And Vomiting    VITALS:  Blood pressure 107/65,  pulse 88, temperature 97.7 F (36.5 C), temperature source Oral, resp. rate 20, height 6' (1.829 m), weight 111.2 kg (245 lb 2.4 oz), SpO2 90 %.  PHYSICAL EXAMINATION:   GENERAL:  77 y.o.-year-old patient lying in the bed with no acute distress.  Morbid obesity EYES: Pupils equal, round, reactive to light and accommodation. No scleral icterus. Extraocular muscles intact.  HEENT: Head atraumatic, normocephalic. Oropharynx and nasopharynx clear.  Dry mucous membranes NECK:  Supple, no jugular venous distention. No thyroid enlargement, no tenderness.  Poor skin turgor LUNGS: Normal breath sounds bilaterally, no wheezing, some crepitation. No use of accessory muscles of respiration.  CARDIOVASCULAR: S1, S2 normal. No murmurs, rubs, or gallops.  ABDOMEN: Soft, nontender, nondistended. Bowel sounds present. No organomegaly or mass.  Nonthrombosed external hemorrhoid, tenderness to palpation EXTREMITIES: No pedal edema, cyanosis, or clubbing.  NEUROLOGIC: Cranial nerves II through XII are intact. Muscle strength 4-5/5 in all extremities. Sensation intact. Gait not checked.  PSYCHIATRIC: The patient is alert and oriented x 3.  SKIN: No obvious rash, lesion, or ulcer.    Physical Exam LABORATORY PANEL:   CBC Recent Labs  Lab 08/17/17 1037  WBC 0.3*  HGB 8.4*  HCT 26.1*  PLT 38*   ------------------------------------------------------------------------------------------------------------------  Chemistries  Recent Labs  Lab 08/17/17 1037  NA 135  K 3.5  CL 106  CO2 20*  GLUCOSE 142*  BUN 20  CREATININE 1.33*  CALCIUM 7.3*  MG 2.0  AST 28  ALT 16*  ALKPHOS 55  BILITOT 1.2   ------------------------------------------------------------------------------------------------------------------  Cardiac Enzymes Recent Labs  Lab 08/17/17 0030 08/17/17 1037  TROPONINI 0.45* 0.63*    ------------------------------------------------------------------------------------------------------------------  RADIOLOGY:  Dg Chest Port 1 View  Result Date: 08/17/2017 CLINICAL DATA:  Acute respiratory failure. EXAM: PORTABLE CHEST 1 VIEW COMPARISON:  08/16/2017 FINDINGS: Right subclavian Port-A-Cath unchanged with tip over the SVC just above the cavoatrial junction. Lungs are adequately inflated without focal consolidation or effusion. Remainder of the exam is unchanged. IMPRESSION: No acute cardiopulmonary disease. Right subclavian Port-A-Cath unchanged. Electronically Signed   By: Marin Olp M.D.   On: 08/17/2017 08:38   Dg Chest Portable 1 View  Result Date: 08/16/2017 CLINICAL DATA:  Diarrhea, shortness of breath for 2 days. EXAM: PORTABLE CHEST 1 VIEW COMPARISON:  07/23/2017 FINDINGS: The right chest wall port a catheter is noted with tip at the cavoatrial junction. Previous median sternotomy and CABG procedure. Aortic atherosclerosis noted. Stable moderate cardiac enlargement. No pleural effusion or edema. No airspace opacities identified. IMPRESSION: 1. Cardiac enlargement.  No acute findings. 2.  Aortic Atherosclerosis (ICD10-I70.0). Electronically Signed   By: Kerby Moors M.D.   On: 08/16/2017 19:20   Ct Renal Stone Study  Result Date: 08/16/2017 CLINICAL DATA:  Diarrhea and dyspnea x2 days. EXAM: CT ABDOMEN AND PELVIS WITHOUT CONTRAST TECHNIQUE: Multidetector CT imaging of the abdomen and pelvis was performed following the standard protocol without IV contrast. COMPARISON:  None. FINDINGS: Lower chest: Low-density appearance of the blood pool within the cardiac chambers with visualization of the interventricular septum compatible with anemia. Bibasilar dependent atelectasis. Small hiatal hernia. Hepatobiliary: No focal liver abnormality is seen. No gallstones, gallbladder wall thickening, or biliary dilatation. Pancreas: Unremarkable. No pancreatic ductal dilatation or  surrounding inflammatory changes. Spleen: Normal in size without focal abnormality. Adrenals/Urinary Tract: Normal bilateral adrenal glands. No nephrolithiasis nor obstructive uropathy. Physiologic distention of the urinary bladder without focal mural thickening or calculus. Several small bladder diverticula are noted along the dome and posterior wall. Stomach/Bowel: Stomach is within normal limits. Appendix is surgically absent by report. No evidence of bowel wall thickening, distention, or inflammatory changes. Vascular/Lymphatic: Moderate aortoiliac atherosclerosis without aneurysm. Atherosclerotic calcifications at the origins of both renal arteries without significant renal cortical scarring. Mild ectasia of the right common iliac artery to 2.2 cm. No pelvic sidewall, inguinal nor abdominal adenopathy. Reproductive: Mild enlargement of the prostate impressing upon the base of the bladder measuring up to 5.9 cm craniocaudad. Normal seminal vesicles. Other: No free air nor free fluid. Musculoskeletal: No acute nor suspicious osseous abnormalities. IMPRESSION: 1. Visualization of the interventricular septum with hypodense appearance of the blood pool compatible with anemia. 2. Bladder diverticulosis. 3. No acute bowel inflammation or inflammation. 4. Moderate aortoiliac and branch vessel atherosclerosis. Electronically Signed   By: Ashley Royalty M.D.   On: 08/16/2017 20:24    ASSESSMENT AND PLAN:   Active Problems:   GIB (gastrointestinal bleeding)   Acute hypoxemic respiratory failure (HCC)   Demand ischemia of myocardium (HCC)   Typical atrial flutter (HCC)   Acute on chronic diastolic heart failure (HCC)   1 acute on chronic symptomatic worsening anemia/pancytopenia Secondary to acute on subacute GI bleeding, exacerbated by chemotherapy for lymphoma Suspect due to hemorrhoidal disease   PPI daily, clear liquid diet for now, transfuse 2 units packed red blood cells, 3 pack of platelets, CBC daily,  consult oncology and GI  strict I&O monitoring, neutropenic precautions, and continue close medical monitoring   GI and oncology suggested currently to monitor because of multiple medical issues.  2 acute BRBPR/GIB Plan of care as stated above  3  acute elevated troponins/possible due to stress   avoid anticoagulants given acute blood loss anemia, antihypertensives on hold given relative hypotension, nitrates as needed, supplemental oxygen as needed, IV morphine as needed  Heart catheterization from January 2019 : -Severe native three-vessel coronary artery disease with moderate distal left main stenosis, total occlusion of the LAD, total occlusion of the ramus intermedius, total occlusion of the first OM, and total occlusion of the RCA. -Status post aortocoronary bypass surgery with continued patency of the RIMA to PDA, LIMA to LAD, and sequential saphenous vein graft to ramus intermedius and first OM branches of the circumflex. 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 Advised to follow as planned outpatient for valvular surgery.  4  sepsis Continue sepsis protocol, empiric vancomycin/Zosyn with pharmacy to dose, follow-up on cultures Could also be related to neutropenic fever in setting of symptomatic worsening pancytopenia/anemia-plan of care as stated above  5 acute kidney injury Baseline renal function normal Exacerbated by acute blood loss anemia Avoid nephrotoxic agents, strict I&O monitoring, daily weights, BMP daily, hold Lasix given acute dehydrated state  6 acute dehydration Exacerbated by acute blood loss anemia and dehydration IV fluids for rehydration and blood transfusion as stated above  7  morbid obesity Most likely secondary to excess calories Lifestyle modification recommended  8 chronic obstructive sleep apnea Stable CPAP at bedtime  9 chronic diabetes mellitus type 2 Hold metformin, sliding scale insulin with Accu-Cheks per routine  10 acute symptomatic hemorrhoidal disease, nonthrombosed Anusol twice daily for 5-day course     All the records are reviewed and case discussed with Care Management/Social Workerr. Management plans discussed with the patient, family and they are in agreement.  CODE STATUS: Full.  TOTAL TIME TAKING CARE OF THIS PATIENT: 35 minutes.   Discussed with his daughter in the room and with ICU physician and cardiologist.  POSSIBLE D/C IN 1-2 DAYS, DEPENDING ON CLINICAL CONDITION.   Vaughan Basta M.D on 08/17/2017   Between 7am to 6pm - Pager - [phone]  After 6pm go to [email or web address] - password [email or web address] [facility]  Office  [phone]  CC: Primary care physician; [name], DO  Note: This dictation was prepared with Dragon dictation along with smaller phrase technology. Any transcriptional errors that result from this process are unintentional.

## 2017-08-17 NOTE — Consult Note (Signed)
PULMONARY / CRITICAL CARE MEDICINE   Name: Richard [name] MRN: [id number] DOB: 1941/06/17    ADMISSION DATE:  08/16/2017   CONSULTATION DATE:  08/17/2017  REFERRING MD:  Dr Jerelyn Charles  REASON: ACUTE  GIB and acute hypoxic respiratory failkure  HISTORY OF PRESENT ILLNESS:  This is a 77 y/o male with a PMH as indicated below, active lymphoma currently undergoing chemotherapy and severe aortic stenosis with planned TAVR who presented to the ED with complaints of diarrhea and worsening dyspnea x2 days. He reports that it all started with bright red blood per rectum x 2-4 weeks which he attributed to his hemorrhoids and low platelet count.  Symptoms associated fever, chills, generalized malaise/weakness and fatigue. His family got concerned about his symptoms hence he was brought in for evaluation. At the ED, he had a fever of 101.3, tachycardic, tachypneic, hypotensive, neutropenic, thrombocytopenic and in normal rate A flutter. There was a significant drop in his hemoglobin from 9.8 to 7.8 a few days ago. He was given 2L of fluids and became acutely hypoxic requiring BiPAP. He is bing admitted to the ICU for sepsis of unknown source, acute hypoxic respiratory failure and acute bleed loss anemia.    He is complaining of profound rectal pain 10/10, steady, worse with any movement. No further rectal bleeding noted on exam  Of note, patient was on doxy for a removed  ingrown toe nail removal on 2/14   PAST MEDICAL HISTORY :  He  has a past medical history of Anemia, Axillary adenopathy (02/25/2017), Bradycardia, CAD (coronary artery disease), Chronic lower back pain, Diffuse large B cell lymphoma ([place]), Diverticulosis, Esophageal stricture, GERD (gastroesophageal reflux disease), History of gout, HTN (hypertension), Mixed hyperlipidemia, OSA on CPAP, Pancytopenia ([place]), Peptic stricture of esophagus, Severe aortic stenosis, Spinal stenosis, and Type II diabetes mellitus ([place]).  PAST SURGICAL HISTORY: He  has  a past surgical history that includes Vasectomy; Coronary artery bypass graft (03/2000); Esophageal dilation (X 3-4); Esophagogastroduodenoscopy; Flexible sigmoidoscopy; Panendoscopy; Knee arthroscopy (Left, 2011); Shoulder surgery (Right, 08/2010); Colonoscopy w/ biopsies and polypectomy (2013); Upper gi endoscopy (2013); Myelogram (04/06/13); Lumbar laminectomy/decompression microdiscectomy (Right, 06/17/2013); Back surgery; Appendectomy (~ 1952); Tonsillectomy (~ 1954); Coronary angioplasty (1993); Coronary angioplasty with stent (05/1997); Skin cancer excision (Right); left heart catheterization with coronary/graft angiogram (N/A, 03/16/2014); Cataract extraction w/PHACO (Left, 11/06/2016); Cataract extraction w/PHACO (Right, 12/04/2016); Hydradenitis excision (Left, 02/25/2017); Portacath placement (N/A, 03/06/2017); and RIGHT/LEFT HEART CATH AND CORONARY/GRAFT ANGIOGRAPHY (N/A, 07/28/2017).  Allergies  Allergen Reactions  . Benazepril Swelling    angioedema; he is not a candidate for any angiotensin receptor blockers because of this significant allergic reaction. Because of a history of documented adverse serious drug reaction;Medi Alert bracelet  is recommended  . Hctz [Hydrochlorothiazide] Anaphylaxis and Swelling    Tongue and lip swelling   . Aspirin Other (See Comments)    Gastritis, cant take 325 Mg aspirin   . Lactose Intolerance (Gi) Nausea And Vomiting    Current Facility-Administered Medications on File Prior to Encounter  Medication  . sodium chloride flush (NS) 0.9 % injection 10 mL   Current Outpatient Medications on File Prior to Encounter  Medication Sig  . allopurinol (ZYLOPRIM) 300 MG tablet Take 450 mg by mouth daily.   [name] amLODipine (NORVASC) 5 MG tablet TAKE 2 TABLETS (10 MG TOTAL) BY MOUTH DAILY. (Patient taking differently: TAKE 5 MG BY MOUTH TWICE DAILY.)  . aspirin 81 MG tablet Take 81 mg by mouth daily.  [name] atorvastatin (LIPITOR) 80 MG tablet  TAKE ONE TABLET BY MOUTH AT  BEDTIME  . diphenoxylate-atropine (LOMOTIL) 2.5-0.025 MG tablet Take 2 pills at the onset of diarrhea, then take one pill after every loose stool. Maximum 8 pills per 24h.  [name] doxycycline (VIBRA-TABS) 100 MG tablet Take 1 tablet (100 mg total) by mouth 2 (two) times daily.  [name] esomeprazole (NEXIUM) 40 MG capsule Take 1 capsule (40 mg total) by mouth daily.  [name] ezetimibe (ZETIA) 10 MG tablet TAKE ONE TABLET BY MOUTH DAILY  . famciclovir (FAMVIR) 500 MG tablet Take 1 tablet (500 mg total) by mouth daily.  . fluticasone (FLONASE) 50 MCG/ACT nasal spray INHALE ONE SPRAY INTO EACH NOSTRIL TWICE DAILY.  . furosemide (LASIX) 40 MG tablet Take 3 tablets (120 mg total) 2 (two) times daily by mouth.  [name] glimepiride (AMARYL) 2 MG tablet TAKE 1 TABLET (2 MG TOTAL) BY MOUTH DAILY WITH BREAKFAST.  [name] glucose 5 g chewable tablet Chew 15 g by mouth as needed for low blood sugar.  [name] glucose blood (FREESTYLE LITE) test strip TEST BLOOD SUGAR bid  . Lancets (FREESTYLE) lancets Use BID to check blood sugar.  DX E11.9  . lidocaine-prilocaine (EMLA) cream Apply to affected area once (Patient taking differently: Apply 1 application topically as needed (to port). )  . lipase/protease/amylase (CREON) 36000 UNITS CPEP capsule Take 2 capsules (72,000 Units total) by mouth 3 (three) times daily with meals. 1 capsule with snack.  [name] LORazepam (ATIVAN) 0.5 MG tablet Take 1 tablet (0.5 mg total) by mouth every 6 (six) hours as needed (Nausea or vomiting).  . Menthol, Topical Analgesic, (ICY HOT EX) Apply 1 application topically daily as needed (muscle pain).  . metFORMIN (GLUCOPHAGE) 1000 MG tablet TAKE ONE AND ONE-HALF TABLETS BY MOUTH EVERY MORNING AND 1 TALBET IN THE EVENING.  . metoprolol tartrate (LOPRESSOR) 25 MG tablet TAKE ONE-HALF TABLET BY MOUTH TWICE DAILY (Patient taking differently: TAKE ONE-HALF TABLET BY MOUTH IN THE MORNING AND HALF TABLET IN THE EVENING)  . NEOMYCIN-POLYMYXIN-HYDROCORTISONE (CORTISPORIN) 1 % SOLN OTIC  solution Apply 1-2 drops to toe BID after soaking  . nitroGLYCERIN (NITROSTAT) 0.4 MG SL tablet Place 1 tablet (0.4 mg total) under the tongue every 5 (five) minutes as needed for chest pain.  [name] ondansetron (ZOFRAN) 8 MG tablet Take 1 tablet (8 mg total) by mouth 2 (two) times daily as needed for refractory nausea / vomiting. Start on day 3 after cyclophosphamide chemotherapy.  . polycarbophil (FIBERCON) 625 MG tablet Take 625 mg by mouth daily.  . potassium chloride SA (KLOR-CON M20) 20 MEQ tablet Take 2 tablets (40 mEq total) 2 (two) times daily by mouth.  . pregabalin (LYRICA) 75 MG capsule Take 1 capsule (75 mg total) by mouth 3 (three) times daily.  . prochlorperazine (COMPAZINE) 10 MG tablet Take 1 tablet (10 mg total) by mouth every 6 (six) hours as needed (Nausea or vomiting).    FAMILY HISTORY:  His indicated that his mother is deceased. He indicated that his father is deceased. He indicated that his maternal grandmother is deceased. He indicated that his maternal grandfather is deceased. He indicated that his paternal grandmother is deceased. He indicated that his paternal grandfather is deceased. He indicated that the status of his neg hx is unknown.   SOCIAL HISTORY: He  reports that  has never smoked. he has never used smokeless tobacco. He reports that he does not drink alcohol or use drugs.  REVIEW OF SYSTEMS:   Constitutional: + for fever and chills.  HENT: Negative for congestion and rhinorrhea.  Eyes: Negative for redness and visual disturbance.  Respiratory: + for shortness of breath but - for wheezing.  Cardiovascular: Negative for chest pain and palpitations.  Gastrointestinal: Negative  for nausea , vomiting and abdominal pain but + for loose stools and rectal pain Genitourinary: Negative for dysuria and urgency.  Endocrine: Denies polyuria, polyphagia and heat intolerance Musculoskeletal: Negative for myalgias and arthralgias.  Skin: Negative for pallor and wound.   Neurological: Negative for dizziness and headaches but positive for generalized weakness   SUBJECTIVE:  Awake with intact mentation and conversing with BiPAP in place  VITAL SIGNS: BP (!) 96/59 (BP Location: Right Arm)   Pulse (!) 106   Temp 99 F (37.2 C) (Axillary)   Resp (!) 26   Wt 248 lb (112.5 kg)   SpO2 93%   BMI 33.63 kg/m   HEMODYNAMICS:    VENTILATOR SETTINGS:    INTAKE / OUTPUT: No intake/output data recorded.  PHYSICAL EXAMINATION: General:  Well developed, well nourished, chronically ill looking Neuro: AAO X 4, speech is normal, moves all extremities, grip strength equal bilaterally  HEENT: PERRLA, neck is supple without JVD Cardiovascular: HR irregular, S1/S2, grade III aortic murmur  Lungs: increased WOB, bilateral breath sounds, diffuse rhonchi and crackles in upper lung fields, breath sounds diminished in the bases Abdomen: non-distended, normal bowel sounds, no organomegaly on palpation Musculoskeletal: +rom, no joint swelling GU: Rectal exam-external hemorrhoids, inflamed, no blood Skin: multiple bruises and ecchymotic areas, warm and dry  LABS:  BMET Recent Labs  Lab 08/16/17 1829  NA 132*  K 4.5  CL 101  CO2 20*  BUN 23*  CREATININE 1.41*  GLUCOSE 267*    Electrolytes Recent Labs  Lab 08/16/17 1829  CALCIUM 7.7*    CBC Recent Labs  Lab 08/16/17 1829  WBC 0.3*  HGB 7.8*  HCT 24.5*  PLT 32*    Coag's Recent Labs  Lab 08/16/17 1829  INR 1.27    Sepsis Markers Recent Labs  Lab 08/16/17 1829 08/16/17 2014  LATICACIDVEN 4.1* 3.1*  PROCALCITON 2.22  --     ABG No results for input(s): PHART, PCO2ART, PO2ART in the last 168 hours.  Liver Enzymes Recent Labs  Lab 08/16/17 1829  AST 26  ALT 12*  ALKPHOS 66  BILITOT 1.1  ALBUMIN 3.5    Cardiac Enzymes Recent Labs  Lab 08/16/17 1829 08/17/17 0030  TROPONINI 0.37* 0.45*    Glucose Recent Labs  Lab 08/17/17 0030  GLUCAP 198*    Imaging Dg Chest  Portable 1 View  Result Date: 08/16/2017 CLINICAL DATA:  Diarrhea, shortness of breath for 2 days. EXAM: PORTABLE CHEST 1 VIEW COMPARISON:  07/23/2017 FINDINGS: The right chest wall port a catheter is noted with tip at the cavoatrial junction. Previous median sternotomy and CABG procedure. Aortic atherosclerosis noted. Stable moderate cardiac enlargement. No pleural effusion or edema. No airspace opacities identified. IMPRESSION: 1. Cardiac enlargement.  No acute findings. 2.  Aortic Atherosclerosis (ICD10-I70.0). Electronically Signed   By: Kerby Moors M.D.   On: 08/16/2017 19:20   Ct Renal Stone Study  Result Date: 08/16/2017 CLINICAL DATA:  Diarrhea and dyspnea x2 days. EXAM: CT ABDOMEN AND PELVIS WITHOUT CONTRAST TECHNIQUE: Multidetector CT imaging of the abdomen and pelvis was performed following the standard protocol without IV contrast. COMPARISON:  None. FINDINGS: Lower chest: Low-density appearance of the blood pool within the cardiac chambers with visualization of the interventricular septum compatible with anemia. Bibasilar dependent  atelectasis. Small hiatal hernia. Hepatobiliary: No focal liver abnormality is seen. No gallstones, gallbladder wall thickening, or biliary dilatation. Pancreas: Unremarkable. No pancreatic ductal dilatation or surrounding inflammatory changes. Spleen: Normal in size without focal abnormality. Adrenals/Urinary Tract: Normal bilateral adrenal glands. No nephrolithiasis nor obstructive uropathy. Physiologic distention of the urinary bladder without focal mural thickening or calculus. Several small bladder diverticula are noted along the dome and posterior wall. Stomach/Bowel: Stomach is within normal limits. Appendix is surgically absent by report. No evidence of bowel wall thickening, distention, or inflammatory changes. Vascular/Lymphatic: Moderate aortoiliac atherosclerosis without aneurysm. Atherosclerotic calcifications at the origins of both renal arteries  without significant renal cortical scarring. Mild ectasia of the right common iliac artery to 2.2 cm. No pelvic sidewall, inguinal nor abdominal adenopathy. Reproductive: Mild enlargement of the prostate impressing upon the base of the bladder measuring up to 5.9 cm craniocaudad. Normal seminal vesicles. Other: No free air nor free fluid. Musculoskeletal: No acute nor suspicious osseous abnormalities. IMPRESSION: 1. Visualization of the interventricular septum with hypodense appearance of the blood pool compatible with anemia. 2. Bladder diverticulosis. 3. No acute bowel inflammation or inflammation. 4. Moderate aortoiliac and branch vessel atherosclerosis. Electronically Signed   By: Ashley Royalty M.D.   On: 08/16/2017 20:24    CULTURES: Blood culture  ANTIBIOTICS: Vancomycin Zosyn  SIGNIFICANT EVENTS: 2/16>admitted  LINES/TUBES: Right chest wall mediport PIVs  DISCUSSION: 77 y/o male presenting with sepsis of unknown source, GIB, acute blood loss anemia, and acute hypoxic respiratory failure secondary to volume overload  ASSESSMENT / PLAN:  PULMONARY A: Acute hypoxic respiratory failure Mild pulmonary edema OSA on home CPAP P:   BiPAP and titrate off as tolerated PFTs Appointment with [facility] scheduled for 3/11 with Dr Elsworth Soho Gentle diuresis Mandatory nocturnal BiPAP CARDIOVASCULAR A:  Severe aortic stenosis-transcatheter aortic valve replacement on September 09, 2017. Mildly Elevated troponin CAD s/p CABG x5 H/o Bradycardia, HTN, hyperlipidemia P:  Hemodynamics per ICU protocol Hold home BP medications F/U with cards as scheduled D/C IV fluids EKG and trend cardiac enzymes RENAL A:   AKI P:   Trend creatinine Monitor and correct electrolytes  GASTROINTESTINAL A:   Acute lower GIB-Likely 2/2 hemorrhoids versus diverticular bleed History of GERD, diverticulosis and Esophageal stricture s/p dilatation P:   Protonix for GI prophylaxis Monitor for bleeding and transfuse  prn  HEMATOLOGIC A:        Acute  Blood loss anemia           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       Anemia of chronic disease/vitamin B-12 deficiency      Pancytopenia P:  Oncology following-Last chemo session 08/08/2017 Scheduled for follow-up PET/CT imaging on September 01, 2017 Transfuse PRBCs and plasma as ordered   INFECTIOUS A:   Sepsis of unknown source Neutropenic fever P:   WBC down to 0.3 F/u cultures Antibiotics as above  ENDOCRINE A:   T2DM with hyperglycemia P:   POCT with SSI coverage  NEUROLOGIC A:   Chronic pain syndrome 2/2 spinal stenosis and gout P:   PRN pain medications  FAMILY  - Updates: Patient updated on current treatment pan  Brantlee Hinde S. [facility] ANP-BC Pulmonary and Critical Care Medicine [facility] Pager [phone] or [phone]  NB: This document was prepared using Dragon voice recognition software and may include unintentional dictation errors.    08/17/2017, 1:26 AM

## 2017-08-17 NOTE — Progress Notes (Signed)
Spoke with Dr. Celesta Aver about patient's blood work post 2 units of blood and 1 unit of platelets. MD does not want patient to get any more platelets at this time, ordered discontinued and blood bank notified.

## 2017-08-17 NOTE — Progress Notes (Signed)
eLink Physician-Brief Progress Note Patient Name: STEVENSON WINDMILLER DOB: May 21, 1941 MRN: [id number]   Date of Service  08/17/2017  HPI/Events of Note  77 yo male with PMH of Aortic Stenosis (valve area = 0.83 cm^2 and Lymphoma - s/p chemo 1 week ago. Presents with GI Bleed. Hgb = 7.8 and BP = 121/97. Now with decreased sats. BiPAP being started. PCCM asked to assume care in stepdown bed. VSS.   eICU Interventions  No new orders.      Intervention Category Evaluation Type: New Patient Evaluation  Lysle Dingwall 08/17/2017, 12:33 AM

## 2017-08-17 NOTE — Consult Note (Signed)
Cardiology Consultation:   Patient ID: Richard [name]; [id number]; Feb 06, 1941   Admit date: 08/16/2017 Date of Consult: 08/17/2017  Primary Care Provider: Carollee Herter, Alferd Apa, DO Primary Cardiologist: Minus Breeding, MD     Patient Profile:   Richard [name] is a 77 y.o. male with a hx of severe aortic stenosis who is being seen today for the evaluation of possible non-ST elevation MI/elevated troponin at the request of Dr. Marthann Schiller.   History of Present Illness:   Richard [name]  has multiple medical problems including recent treatment for lymphoma, severe aortic stenosis, history of coronary artery disease status post CABG in 2001.  He underwent cardiac catheterization in January 2019 and this revealed that 4 out of his 5 grafts were patent.  He currently denies any discomfort in his chest.  He was found to be anemic.  He notes rectal bleeding over the last several days.  It is unclear whether his drop in hemoglobin is solely from the rectal bleeding or is it somehow related to his chemotherapy as well.  GI is consulting.  Tagged RBC scan was suggested, although they feel that lower GI bleeding from a hemorrhoid is also possibility.  He underwent 2 unit transfusion.  He developed some shortness of breath as well.  Currently, his breathing is better.  He also developed atrial flutter.  This has been rate controlled.  He is scheduled for TAVR on September 09, 2017 for his severe aortic stenosis.  He has been evaluated by Dr. Angelena Form and Dr. Cyndia Bent as well.     Past Medical History:  Diagnosis Date  . Anemia   . Axillary adenopathy 02/25/2017  . Bradycardia    a. holter monitor has demonstrated HRs in 30s and [place]   . CAD (coronary artery disease)    a. s/p CABG 2001  . Chronic lower back pain   . Diffuse large B cell lymphoma ([place])   . Diverticulosis   . Esophageal stricture   . GERD (gastroesophageal reflux disease)   . History of gout   . HTN (hypertension)   . Mixed  hyperlipidemia   . OSA on CPAP    with 2L O2 at night  . Pancytopenia ([place])    a. related to chemo therapy for B cell lymphoma  . Peptic stricture of esophagus   . Severe aortic stenosis   . Spinal stenosis   . Type II diabetes mellitus ([place])     Past Surgical History:  Procedure Laterality Date  . APPENDECTOMY  ~ 1952  . BACK SURGERY    . CATARACT EXTRACTION W/PHACO Left 11/06/2016   Procedure: CATARACT EXTRACTION PHACO AND INTRAOCULAR LENS PLACEMENT (IOC);  Surgeon: Estill Cotta, MD;  Location: [facility]S;  Service: Ophthalmology;  Laterality: Left;  Lot # [id number] H [place]: 01:09.4 AP%:25.2 CDE: 30.64  . CATARACT EXTRACTION W/PHACO Right 12/04/2016   Procedure: CATARACT EXTRACTION PHACO AND INTRAOCULAR LENS PLACEMENT (IOC);  Surgeon: Estill Cotta, MD;  Location: [facility];  Service: Ophthalmology;  Laterality: Right;  [place] 1:25.9 AP% 24.1 CDE 39.10 Fluid Pack lot # [id number]  . COLONOSCOPY W/ BIOPSIES AND POLYPECTOMY  2013  . CORONARY ANGIOPLASTY  1993  . CORONARY ANGIOPLASTY WITH STENT PLACEMENT  05/1997   "1"  . CORONARY ARTERY BYPASS GRAFT  03/2000   "CABG X5"  . ESOPHAGEAL DILATION  X 3-4   Dr. Lyla Son; "last one was in the 1990's"  . ESOPHAGOGASTRODUODENOSCOPY     multiple  . FLEXIBLE SIGMOIDOSCOPY  multiple  . HYDRADENITIS EXCISION Left 02/25/2017   Procedure: EXCISION DEEP LEFT AXILLARY LYMPH NODE;  Surgeon: Fanny Skates, MD;  Location: [facility];  Service: General;  Laterality: Left;  . KNEE ARTHROSCOPY Left 2011   meniscus repair  . LEFT HEART CATHETERIZATION WITH CORONARY/GRAFT ANGIOGRAM N/A 03/16/2014   Procedure: LEFT HEART CATHETERIZATION WITH Beatrix Fetters;  Surgeon: Burnell Blanks, MD;  Location: [facility] CATH LAB;  Service: Cardiovascular;  Laterality: N/A;  . LUMBAR LAMINECTOMY/DECOMPRESSION MICRODISCECTOMY Right 06/17/2013   Procedure: LUMBAR LAMINECTOMY MICRODISCECTOMY L4-L5 RIGHT EXCISION OF SYNOVIAL CYST RIGHT   (1 LEVEL) RIGHT PARTIAL  FACETECTOMY;  Surgeon: Tobi Bastos, MD;  Location: [facility];  Service: Orthopedics;  Laterality: Right;  . MYELOGRAM  04/06/13   lumbar, Dr Gladstone Lighter  . PANENDOSCOPY    . PORTACATH PLACEMENT N/A 03/06/2017   Procedure: INSERTION PORT-A-CATH AND ASPIRATE SEROMA LEFT AXILLA;  Surgeon: Fanny Skates, MD;  Location: [facility];  Service: General;  Laterality: N/A;  . RIGHT/LEFT HEART CATH AND CORONARY/GRAFT ANGIOGRAPHY N/A 07/28/2017   Procedure: RIGHT/LEFT HEART CATH AND CORONARY/GRAFT ANGIOGRAPHY;  Surgeon: Sherren Mocha, MD;  Location: [facility] CV LAB;  Service: Cardiovascular;  Laterality: N/A;  . SHOULDER SURGERY Right 08/2010   screws placed; "tendons tore off"  . SKIN CANCER EXCISION Right    "neck"  . TONSILLECTOMY  ~ 1954  . UPPER GI ENDOSCOPY  2013   Gastritis; Dr Carlean Purl  . VASECTOMY         Inpatient Medications: Scheduled Meds: . acetaminophen  650 mg Oral Once  . allopurinol  450 mg Oral Daily  . atorvastatin  80 mg Oral QHS  . chlorhexidine  15 mL Mouth Rinse BID  . diphenhydrAMINE  25 mg Oral Once  . ezetimibe  10 mg Oral Daily  . fluticasone  1 spray Each Nare Daily  . glimepiride  2 mg Oral Q breakfast  . hydrocortisone  25 mg Rectal BID  . insulin aspart  0-20 Units Subcutaneous TID WC  . insulin aspart  0-5 Units Subcutaneous QHS  . lipase/protease/amylase  72,000 Units Oral TID WC  . mouth rinse  15 mL Mouth Rinse q12n4p  . pantoprazole  40 mg Oral Daily  . polycarbophil  625 mg Oral Daily  . pregabalin  75 mg Oral TID  . valACYclovir  1,000 mg Oral Daily   Continuous Infusions: . sodium chloride Stopped (08/17/17 0942)  . piperacillin-tazobactam (ZOSYN)  IV Stopped (08/17/17 0800)  . vancomycin Stopped (08/17/17 0600)   PRN Meds: acetaminophen **OR** acetaminophen, diphenoxylate-atropine, furosemide, HYDROcodone-acetaminophen, HYDROmorphone (DILAUDID) injection, lidocaine-prilocaine, LORazepam, nitroGLYCERIN, ondansetron, prochlorperazine, promethazine,  witch hazel-glycerin  Allergies:    Allergies  Allergen Reactions  . Benazepril Swelling    angioedema; he is not a candidate for any angiotensin receptor blockers because of this significant allergic reaction. Because of a history of documented adverse serious drug reaction;Medi Alert bracelet  is recommended  . Hctz [Hydrochlorothiazide] Anaphylaxis and Swelling    Tongue and lip swelling   . Aspirin Other (See Comments)    Gastritis, cant take 325 Mg aspirin   . Lactose Intolerance (Gi) Nausea And Vomiting    Social History:   Social History   Socioeconomic History  . Marital status: Divorced    Spouse name: Not on file  . Number of children: 2  . Years of education: Not on file  . Highest education level: Not on file  Social Needs  . Financial resource strain: Not on file  . Food insecurity -  worry: Not on file  . Food insecurity - inability: Not on file  . Transportation needs - medical: Not on file  . Transportation needs - non-medical: Not on file  Occupational History    Employer: RETIRED  Tobacco Use  . Smoking status: Never Smoker  . Smokeless tobacco: Never Used  Substance and Sexual Activity  . Alcohol use: No    Alcohol/week: 0.0 oz    Comment: "last drink was in 2012"( 03/15/2014)  . Drug use: No  . Sexual activity: Not Currently  Other Topics Concern  . Not on file  Social History Narrative   Divorced, lives with a roommate.   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    Family History:    Family History  Problem Relation Age of Onset  . Stroke Father   . Hypertension Father   . Pancreatic cancer Mother   . Diabetes Maternal Grandmother   . Stroke Maternal Grandmother   . Heart attack Paternal Grandmother   . Colon cancer Neg Hx   . Esophageal cancer Neg Hx   . Rectal  cancer Neg Hx   . Stomach cancer Neg Hx   . Ulcers Neg Hx      ROS:  Please see the history of present illness.  SHortness of breath; recent BRBPR. All other ROS reviewed and negative.     Physical Exam/Data:   Vitals:   08/17/17 1039 08/17/17 1100 08/17/17 1139 08/17/17 1140  BP:  102/61    Pulse: 83 68 74 74  Resp: (!) 22 (!) 23 20 20   Temp:      TempSrc:      SpO2: (!) 89% 90% 90% 90%  Weight:      Height:        Intake/Output Summary (Last 24 hours) at 08/17/2017 1235 Last data filed at 08/17/2017 0905 Gross per 24 hour  Intake 3028 ml  Output 1550 ml  Net 1478 ml   Filed Weights   08/16/17 1847 08/17/17 0833  Weight: 248 lb (112.5 kg) 245 lb 2.4 oz (111.2 kg)   Body mass index is 33.25 kg/m.  General:  Well nourished, well developed, in no acute distress HEENT: normal Lymph: no adenopathy Neck: no JVD Endocrine:  No thryomegaly Vascular: No carotid bruits; FA pulses 2+ bilaterally without bruits  Cardiac:  normal S1, S2; RRR; 3 out of 6 harsh systolic murmur  Lungs:  clear to auscultation bilaterally, no wheezing, rhonchi or rales  Abd: soft, nontender, no hepatomegaly  Ext: no edema Musculoskeletal:  No deformities, BUE and BLE strength normal and equal Skin: warm and dry  Neuro:  CNs 2-12 intact, no focal abnormalities noted Psych:  Normal affect   EKG:  The EKG from 07/25/17 was personally reviewed and demonstrates: Normal sinus rhythm, no ST segment changes Telemetry:  Telemetry was personally reviewed and demonstrates: Atrial flutter with variable rate control, rates typically under 100  Relevant CV Studies: I personally reviewed the cath results from January 2019.;  LVEF 55-60 in November 2018 by echocardiogram  Laboratory Data:  Chemistry Recent Labs  Lab 08/16/17 1829 08/17/17 1037  NA 132* 135  K 4.5 3.5  CL 101 106  CO2 20* 20*  GLUCOSE 267* 142*  BUN 23* 20  CREATININE 1.41* 1.33*  CALCIUM 7.7* 7.3*  GFRNONAA 46*  [place] 9    Recent Labs  Lab 08/16/17 1829 08/17/17 1037  PROT 6.0* 5.7*  ALBUMIN 3.5 3.1*  AST 26 28  ALT 12* 16*  ALKPHOS 66 55  BILITOT 1.1 1.2   Hematology Recent Labs  Lab 08/16/17 1829 08/17/17 1037  WBC 0.3* 0.3*  RBC 2.80* 2.99*  HGB 7.8* 8.4*  HCT 24.5* 26.1*  MCV 87.3 87.6  MCH 27.8 28.2  MCHC 31.8* 32.2  RDW 19.8* 18.8*  PLT 32* 38*   Cardiac Enzymes Recent Labs  Lab 08/16/17 1829 08/17/17 0030 08/17/17 1037  TROPONINI 0.37* 0.45* 0.63*   No results for input(s): TROPIPOC in the last 168 hours.  BNPNo results for input(s): BNP, PROBNP in the last 168 hours.  DDimer No results for input(s): DDIMER in the last 168 hours.  Radiology/Studies:  Dg Chest Port 1 View  Result Date: 08/17/2017 CLINICAL DATA:  Acute respiratory failure. EXAM: PORTABLE CHEST 1 VIEW COMPARISON:  08/16/2017 FINDINGS: Right subclavian Port-A-Cath unchanged with tip over the SVC just above the cavoatrial junction. Lungs are adequately inflated without focal consolidation or effusion. Remainder of the exam is unchanged. IMPRESSION: No acute cardiopulmonary disease. Right subclavian Port-A-Cath unchanged. Electronically Signed   By: Marin Olp M.D.   On: 08/17/2017 08:38   Dg Chest Portable 1 View  Result Date: 08/16/2017 CLINICAL DATA:  Diarrhea, shortness of breath for 2 days. EXAM: PORTABLE CHEST 1 VIEW COMPARISON:  07/23/2017 FINDINGS: The right chest wall port a catheter is noted with tip at the cavoatrial junction. Previous median sternotomy and CABG procedure. Aortic atherosclerosis noted. Stable moderate cardiac enlargement. No pleural effusion or edema. No airspace opacities identified. IMPRESSION: 1. Cardiac enlargement.  No acute findings. 2.  Aortic Atherosclerosis (ICD10-I70.0). Electronically Signed   By: Kerby Moors M.D.   On: 08/16/2017 19:20   Ct Renal Stone Study  Result Date: 08/16/2017 CLINICAL DATA:  Diarrhea and dyspnea x2 days. EXAM: CT ABDOMEN AND  PELVIS WITHOUT CONTRAST TECHNIQUE: Multidetector CT imaging of the abdomen and pelvis was performed following the standard protocol without IV contrast. COMPARISON:  None. FINDINGS: Lower chest: Low-density appearance of the blood pool within the cardiac chambers with visualization of the interventricular septum compatible with anemia. Bibasilar dependent atelectasis. Small hiatal hernia. Hepatobiliary: No focal liver abnormality is seen. No gallstones, gallbladder wall thickening, or biliary dilatation. Pancreas: Unremarkable. No pancreatic ductal dilatation or surrounding inflammatory changes. Spleen: Normal in size without focal abnormality. Adrenals/Urinary Tract: Normal bilateral adrenal glands. No nephrolithiasis nor obstructive uropathy. Physiologic distention of the urinary bladder without focal mural thickening or calculus. Several small bladder diverticula are noted along the dome and posterior wall. Stomach/Bowel: Stomach is within normal limits. Appendix is surgically absent by report. No evidence of bowel wall thickening, distention, or inflammatory changes. Vascular/Lymphatic: Moderate aortoiliac atherosclerosis without aneurysm. Atherosclerotic calcifications at the origins of both renal arteries without significant renal cortical scarring. Mild ectasia of the right common iliac artery to 2.2 cm. No pelvic sidewall, inguinal nor abdominal adenopathy. Reproductive: Mild enlargement of the prostate impressing upon the base of the bladder measuring up to 5.9 cm craniocaudad. Normal seminal vesicles. Other: No free air nor free fluid. Musculoskeletal: No acute nor suspicious osseous abnormalities. IMPRESSION: 1. Visualization of the interventricular septum with hypodense appearance of the blood pool compatible with anemia. 2. Bladder diverticulosis. 3. No acute bowel inflammation or inflammation. 4. Moderate aortoiliac and branch vessel atherosclerosis. Electronically Signed   By: Ashley Royalty  M.D.   On:  08/16/2017 20:24    Assessment and Plan:   1. Elevated troponin: I think this is likely demand ischemia, type 2 NSTEMI, due to all the above factors.  I doubt plaque rupture MI.  No indication for anticoagulation for ACS.  Recent catheter results noted above and make true plaque rupture MI much less likely.  2.  Acute on chronic diastolic heart failure: This is multi-factorial in the setting of severe aortic stenosis, anemia requiring transfusion and new onset atrial flutter.  Diuresis as renal function will tolerate and is needed from a respiratory standpoint.  Creatinine improving.   3.  Atrial flutter: His chads vasc score is high.  However, given his recent GI bleeding and anemia, he is not a candidate for anticoagulation.  Will inform Dr. Angelena Form regarding this change and see if it affects the management of his aortic stenosis/TAVR.  Continue supportive care.  Will follow.   For questions or updates, please contact [place] Please consult [email or web address] for contact info under Cardiology/STEMI.   SignedLarae Grooms, MD  08/17/2017 12:35 PM

## 2017-08-17 NOTE — Consult Note (Addendum)
Jonathon Bellows , MD [address] [place], [place], [place], [address] [address], [place], [place], [place], [address] Phone: [phone]  Fax: [phone]  Consultation  Referring Provider: Dr Marthann Schiller  Primary Care Physician:  Carollee Herter, Alferd Apa, DO Primary Gastroenterologist:  None          Reason for Consultation:     GI bleed  Date of Admission:  08/16/2017 Date of Consultation:  08/17/2017         HPI:   Richard [name] is a 77 y.o. male who has a past medical history of coronary artery disease status post CABG, hypertension, diabetes mellitus, sleep apnea.  Follows with [facility] gastroenterology but not seen for the past 2-1/2 years.  Last colonoscopy in October 2016 with left-sided colonic diverticulosis. [name]  He has a history of B-cell lymphoma and had his eighth round of chemotherapy 8 days back.  Under evaluation for a Taber procedure for aortic stenosis.  He presented with a 2-day history of fever shortness of breath dyspnea sweating and bright red blood per rectum for 2-4 weeks.  On admission he had an atrial flutter, creatinine of 1.4 platelet count of 32 hemoglobin of 7.8 white cell count of 1.3.   No imaging available at this point of time.  Troponin elevated at 0.45.  On admission lactic acid was 4.1 down to 3.1 last night.  Urine analysis shows no infection.  Blood cultures showed no growth till date. On admission white cell count was 0.3 with a neutrophil count of 76%   He says that for a few weeks he has noticed blood on the toilet paper when he wipes that has been bight red Past Medical History:  Diagnosis Date  . Anemia   . Axillary adenopathy 02/25/2017  . Bradycardia    a. holter monitor has demonstrated HRs in 30s and [place]   . CAD (coronary artery disease)    a. s/p CABG 2001  . Chronic lower back pain   . Diffuse large B cell lymphoma ([place])   . Diverticulosis   . Esophageal stricture   . GERD (gastroesophageal reflux disease)   . History of  gout   . HTN (hypertension)   . Mixed hyperlipidemia   . OSA on CPAP    with 2L O2 at night  . Pancytopenia ([place])    a. related to chemo therapy for B cell lymphoma  . Peptic stricture of esophagus   . Severe aortic stenosis   . Spinal stenosis   . Type II diabetes mellitus ([employer])     Past Surgical History:  Procedure Laterality Date  . APPENDECTOMY  ~ 1952  . BACK SURGERY    . CATARACT EXTRACTION W/PHACO Left 11/06/2016   Procedure: CATARACT EXTRACTION PHACO AND INTRAOCULAR LENS PLACEMENT (IOC);  Surgeon: Estill Cotta, MD;  Location: [facility] ORS;  Service: Ophthalmology;  Laterality: Left;  Lot # [id number] H [place]: 01:09.4 AP%:25.2 CDE: 30.64  . CATARACT EXTRACTION W/PHACO Right 12/04/2016   Procedure: CATARACT EXTRACTION PHACO AND INTRAOCULAR LENS PLACEMENT (IOC);  Surgeon: Estill Cotta, MD;  Location: [facility] ORS;  Service: Ophthalmology;  Laterality: Right;  [place] 1:25.9 AP% 24.1 CDE 39.10 Fluid Pack lot # [id number]  . COLONOSCOPY W/ BIOPSIES AND POLYPECTOMY  2013  . CORONARY ANGIOPLASTY  1993  . CORONARY ANGIOPLASTY WITH STENT PLACEMENT  05/1997   "1"  . CORONARY ARTERY BYPASS GRAFT  03/2000   "CABG X5"  . ESOPHAGEAL DILATION  X 3-4   Dr. Lyla Son; "  last one was in the 1990's"  . ESOPHAGOGASTRODUODENOSCOPY     multiple  . FLEXIBLE SIGMOIDOSCOPY     multiple  . HYDRADENITIS EXCISION Left 02/25/2017   Procedure: EXCISION DEEP LEFT AXILLARY LYMPH NODE;  Surgeon: Fanny Skates, MD;  Location: [place];  Service: General;  Laterality: Left;  . KNEE ARTHROSCOPY Left 2011   meniscus repair  . LEFT HEART CATHETERIZATION WITH CORONARY/GRAFT ANGIOGRAM N/A 03/16/2014   Procedure: LEFT HEART CATHETERIZATION WITH Beatrix Fetters;  Surgeon: Burnell Blanks, MD;  Location: [facility] CATH LAB;  Service: Cardiovascular;  Laterality: N/A;  . LUMBAR LAMINECTOMY/DECOMPRESSION MICRODISCECTOMY Right 06/17/2013   Procedure: LUMBAR LAMINECTOMY MICRODISCECTOMY L4-L5 RIGHT EXCISION OF  SYNOVIAL CYST RIGHT   (1 LEVEL) RIGHT PARTIAL FACETECTOMY;  Surgeon: Tobi Bastos, MD;  Location: [facility] ORS;  Service: Orthopedics;  Laterality: Right;  . MYELOGRAM  04/06/13   lumbar, Dr Gladstone Lighter  . PANENDOSCOPY    . PORTACATH PLACEMENT N/A 03/06/2017   Procedure: INSERTION PORT-A-CATH AND ASPIRATE SEROMA LEFT AXILLA;  Surgeon: Fanny Skates, MD;  Location: [place];  Service: General;  Laterality: N/A;  . RIGHT/LEFT HEART CATH AND CORONARY/GRAFT ANGIOGRAPHY N/A 07/28/2017   Procedure: RIGHT/LEFT HEART CATH AND CORONARY/GRAFT ANGIOGRAPHY;  Surgeon: Sherren Mocha, MD;  Location: [place] CV LAB;  Service: Cardiovascular;  Laterality: N/A;  . SHOULDER SURGERY Right 08/2010   screws placed; "tendons tore off"  . SKIN CANCER EXCISION Right    "neck"  . TONSILLECTOMY  ~ 1954  . UPPER GI ENDOSCOPY  2013   Gastritis; Dr Carlean Purl  . VASECTOMY      Prior to Admission medications   Medication Sig Start Date End Date Taking? Authorizing Provider  allopurinol (ZYLOPRIM) 300 MG tablet Take 450 mg by mouth daily.     [provider]  amLODipine (NORVASC) 5 MG tablet TAKE 2 TABLETS (10 MG TOTAL) BY MOUTH DAILY. Patient taking differently: TAKE 5 MG BY MOUTH TWICE DAILY. 01/15/17   Minus Breeding, MD  aspirin 81 MG tablet Take 81 mg by mouth daily.    [provider]  atorvastatin (LIPITOR) 80 MG tablet TAKE ONE TABLET BY MOUTH AT BEDTIME 01/29/17   Minus Breeding, MD  diphenoxylate-atropine (LOMOTIL) 2.5-0.025 MG tablet Take 2 pills at the onset of diarrhea, then take one pill after every loose stool. Maximum 8 pills per 24h. 04/04/17   [name], MD  doxycycline (VIBRA-TABS) 100 MG tablet Take 1 tablet (100 mg total) by mouth 2 (two) times daily. 08/14/17   Hyatt, Max T, DPM  esomeprazole (NEXIUM) 40 MG capsule Take 1 capsule (40 mg total) by mouth daily. 10/15/12   [name], MD  ezetimibe (ZETIA) 10 MG tablet TAKE ONE TABLET BY MOUTH DAILY 01/15/17   Minus Breeding, MD    famciclovir (FAMVIR) 500 MG tablet Take 1 tablet (500 mg total) by mouth daily. 02/27/17   [name], MD  fluticasone (FLONASE) 50 MCG/ACT nasal spray INHALE ONE SPRAY INTO EACH NOSTRIL TWICE DAILY. 05/05/17   [name], DO  furosemide (LASIX) 40 MG tablet Take 3 tablets (120 mg total) 2 (two) times daily by mouth. 05/13/17   Almyra Deforest, PA  glimepiride (AMARYL) 2 MG tablet TAKE 1 TABLET (2 MG TOTAL) BY MOUTH DAILY WITH BREAKFAST. 05/16/17   Roma Schanz R, DO  glucose 5 g chewable tablet Chew 15 g by mouth as needed for low blood sugar.    [provider]  glucose blood (FREESTYLE LITE) test strip  TEST BLOOD SUGAR bid 04/03/17   Carollee Herter, Kendrick Fries R, DO  Lancets (FREESTYLE) lancets Use BID to check blood sugar.  DX E11.9 04/03/17   [name], DO  lidocaine-prilocaine (EMLA) cream Apply to affected area once Patient taking differently: Apply 1 application topically as needed (to port).  03/04/17   [name], MD  lipase/protease/amylase (CREON) 36000 UNITS CPEP capsule Take 2 capsules (72,000 Units total) by mouth 3 (three) times daily with meals. 1 capsule with snack. 05/30/17   [place], Holli Humbles, NP  LORazepam (ATIVAN) 0.5 MG tablet Take 1 tablet (0.5 mg total) by mouth every 6 (six) hours as needed (Nausea or vomiting). 03/04/17   [name], MD  Menthol, Topical Analgesic, (ICY HOT EX) Apply 1 application topically daily as needed (muscle pain).    [provider]  metFORMIN (GLUCOPHAGE) 1000 MG tablet TAKE ONE AND ONE-HALF TABLETS BY MOUTH EVERY MORNING AND 1 TALBET IN THE EVENING. 02/24/17   Carollee Herter, Alferd Apa, DO  metoprolol tartrate (LOPRESSOR) 25 MG tablet TAKE ONE-HALF TABLET BY MOUTH TWICE DAILY Patient taking differently: TAKE ONE-HALF TABLET BY MOUTH IN THE MORNING AND HALF TABLET IN THE EVENING 01/15/17   Minus Breeding, MD  NEOMYCIN-POLYMYXIN-HYDROCORTISONE (CORTISPORIN) 1 % SOLN OTIC solution Apply 1-2 drops to toe BID  after soaking 08/14/17   Hyatt, Max T, DPM  nitroGLYCERIN (NITROSTAT) 0.4 MG SL tablet Place 1 tablet (0.4 mg total) under the tongue every 5 (five) minutes as needed for chest pain. 09/18/16   Minus Breeding, MD  ondansetron (ZOFRAN) 8 MG tablet Take 1 tablet (8 mg total) by mouth 2 (two) times daily as needed for refractory nausea / vomiting. Start on day 3 after cyclophosphamide chemotherapy. 03/04/17   [name], MD  polycarbophil (FIBERCON) 625 MG tablet Take 625 mg by mouth daily.    [provider]  potassium chloride SA (KLOR-CON M20) 20 MEQ tablet Take 2 tablets (40 mEq total) 2 (two) times daily by mouth. 05/13/17   Almyra Deforest, PA  pregabalin (LYRICA) 75 MG capsule Take 1 capsule (75 mg total) by mouth 3 (three) times daily. 04/03/17   [name], DO  prochlorperazine (COMPAZINE) 10 MG tablet Take 1 tablet (10 mg total) by mouth every 6 (six) hours as needed (Nausea or vomiting). 03/04/17   [name], MD    Family History  Problem Relation Age of Onset  . Stroke Father   . Hypertension Father   . Pancreatic cancer Mother   . Diabetes Maternal Grandmother   . Stroke Maternal Grandmother   . Heart attack Paternal Grandmother   . Colon cancer Neg Hx   . Esophageal cancer Neg Hx   . Rectal cancer Neg Hx   . Stomach cancer Neg Hx   . Ulcers Neg Hx      Social History   Tobacco Use  . Smoking status: Never Smoker  . Smokeless tobacco: Never Used  Substance Use Topics  . Alcohol use: No    Alcohol/week: 0.0 oz    Comment: "last drink was in 2012"( 03/15/2014)  . Drug use: No    Allergies as of 08/16/2017 - Review Complete 08/14/2017  Allergen Reaction Noted  . Benazepril Swelling 10/08/2010  . Hctz [hydrochlorothiazide] Anaphylaxis and Swelling 04/06/2013  . Aspirin Other (See Comments) 04/06/2013  . Lactose intolerance (gi) Nausea And Vomiting 03/15/2016    Review of Systems:    All systems reviewed and negative except where noted in  HPI.    Physical Exam:  Vital signs in last 24 hours: Temp:  [97.5 F (36.4 C)-101.3 F (38.5 C)] 97.5 F (36.4 C) (02/17 0600) Pulse Rate:  [64-106] 64 (02/17 0755) Resp:  [12-28] 18 (02/17 0755) BP: (91-121)/(55-97) 100/60 (02/17 0700) SpO2:  [80 %-98 %] 98 % (02/17 0755) FiO2 (%):  [40 %] 40 % (02/17 0755) Weight:  [248 lb (112.5 kg)] 248 lb (112.5 kg) (02/16 1847) Last BM Date: 08/17/17 General:   Pleasant, cooperative in NAD Head:  Normocephalic and atraumatic. Eyes:   No icterus.   Conjunctiva pink. PERRLA. Ears:  Normal auditory acuity. Neck:  Supple; no masses or thyroidomegaly Lungs: Respirations even and unlabored. Lungs clear to auscultation bilaterally.   No wheezes, crackles, or rhonchi.  Heart:  Regular rate and rhythm;  Systolic murmur +no  clicks, rubs or gallops Abdomen:  Soft, nondistended, nontender. Normal bowel sounds. No appreciable masses or hepatomegaly.  No rebound or guarding.  Neurologic:  Alert and oriented x3;  grossly normal neurologically. Skin:  Intact without significant lesions or rashes. Cervical Nodes:  No significant cervical adenopathy. Psych:  Alert and cooperative. Normal affect.  LAB RESULTS: Recent Labs    08/16/17 1829  WBC 0.3*  HGB 7.8*  HCT 24.5*  PLT 32*   BMET Recent Labs    08/16/17 1829  NA 132*  K 4.5  CL 101  CO2 20*  GLUCOSE 267*  BUN 23*  CREATININE 1.41*  CALCIUM 7.7*   LFT Recent Labs    08/16/17 1829  PROT 6.0*  ALBUMIN 3.5  AST 26  ALT 12*  ALKPHOS 66  BILITOT 1.1   PT/INR Recent Labs    08/16/17 1829  LABPROT 15.8*  INR 1.27    STUDIES: Dg Chest Portable 1 View  Result Date: 08/16/2017 CLINICAL DATA:  Diarrhea, shortness of breath for 2 days. EXAM: PORTABLE CHEST 1 VIEW COMPARISON:  07/23/2017 FINDINGS: The right chest wall port a catheter is noted with tip at the cavoatrial junction. Previous median sternotomy and CABG procedure. Aortic atherosclerosis noted. Stable moderate cardiac enlargement.  No pleural effusion or edema. No airspace opacities identified. IMPRESSION: 1. Cardiac enlargement.  No acute findings. 2.  Aortic Atherosclerosis (ICD10-I70.0). Electronically Signed   By: Kerby Moors M.D.   On: 08/16/2017 19:20   Ct Renal Stone Study  Result Date: 08/16/2017 CLINICAL DATA:  Diarrhea and dyspnea x2 days. EXAM: CT ABDOMEN AND PELVIS WITHOUT CONTRAST TECHNIQUE: Multidetector CT imaging of the abdomen and pelvis was performed following the standard protocol without IV contrast. COMPARISON:  None. FINDINGS: Lower chest: Low-density appearance of the blood pool within the cardiac chambers with visualization of the interventricular septum compatible with anemia. Bibasilar dependent atelectasis. Small hiatal hernia. Hepatobiliary: No focal liver abnormality is seen. No gallstones, gallbladder wall thickening, or biliary dilatation. Pancreas: Unremarkable. No pancreatic ductal dilatation or surrounding inflammatory changes. Spleen: Normal in size without focal abnormality. Adrenals/Urinary Tract: Normal bilateral adrenal glands. No nephrolithiasis nor obstructive uropathy. Physiologic distention of the urinary bladder without focal mural thickening or calculus. Several small bladder diverticula are noted along the dome and posterior wall. Stomach/Bowel: Stomach is within normal limits. Appendix is surgically absent by report. No evidence of bowel wall thickening, distention, or inflammatory changes. Vascular/Lymphatic: Moderate aortoiliac atherosclerosis without aneurysm. Atherosclerotic calcifications at the origins of both renal arteries without significant renal cortical scarring. Mild ectasia of the right common iliac artery to 2.2 cm. No pelvic sidewall, inguinal nor abdominal adenopathy. Reproductive: Mild enlargement of  the prostate impressing upon the base of the bladder measuring up to 5.9 cm craniocaudad. Normal seminal vesicles. Other: No free air nor free fluid. Musculoskeletal: No acute  nor suspicious osseous abnormalities. IMPRESSION: 1. Visualization of the interventricular septum with hypodense appearance of the blood pool compatible with anemia. 2. Bladder diverticulosis. 3. No acute bowel inflammation or inflammation. 4. Moderate aortoiliac and branch vessel atherosclerosis. Electronically Signed   By: Ashley Royalty M.D.   On: 08/16/2017 20:24      Impression / Plan:   SHONE LEVENTHAL is a 78 y.o. y/o male with coronary artery disease, aortic stenosis, B-cell lymphoma status post chemotherapy 8 days back.  Presents to the hospital with features suggestive of neutropenic sepsis, elevated troponins could be due to sepsis and hypovolemia versus an actual cardiac event due to severe aortic stenosis.    I have been consulted for rectal bleeding which is going on for more than a week.  Hemoglobin dropped by about 2 g from 9.8 g to 7.8 g this is after the patient received chemotherapy not only had a hemoglobin drop but also the other cell lines have also dropped during this time ,the platelet count has dropped to 32 and the white cell count is  0.3.  The low hemoglobin may be due to a combination of pancytopenia related to the chemotherapy with an element of blood loss as well.  In any event at this point of time the patient is too sick  for any endoscopy procedure and anesthesia.    If the patient has overt GI bleeding would suggest a tagged RBC scan and if the area can be localized then would require vascular embolization.  It is very likely that he has developed lower GI bleeding from a bleeding hemorroid.  Suggest to continue supportive care transfuse as required, continue on a PPI.  I would not recommend  any stool occult testing and rectal exams in the setting of neutropenic sepsis.  Thank you for involving me in the care of this patient.      LOS: 1 day   Jonathon Bellows, MD  08/17/2017, 8:17 AM

## 2017-08-17 NOTE — Progress Notes (Signed)
Pharmacy Antibiotic Note  Richard [name] is a 77 y.o. male admitted on 08/16/2017 with sepsis.  Pharmacy has been consulted for vanc/zosyn dosing.  Plan: Piperacillin/tazobactam 3.375 g IV q8h EI  Patient received one dose on vancomycin 1250 mg IV q12h. Given renal function, will adjust dose to vancomycin 1250 mg IV q18h and check VT at steady state Goal VT 15-20 mcg/mL  Kinetics: Adjusted body weight = 91 kg, CrCl 59 mL/min Ke: 0.053 Half-life: 13 hrs Cmin 15 mcg/mL  Height: 6' (182.9 cm) Weight: 245 lb 2.4 oz (111.2 kg) IBW/kg (Calculated) : 77.6  Temp (24hrs), Avg:99 F (37.2 C), Min:97.5 F (36.4 C), Max:101.3 F (38.5 C)  Recent Labs  Lab 08/16/17 1829 08/16/17 2014 08/17/17 1037  WBC 0.3*  --  0.3*  CREATININE 1.41*  --  1.33*  LATICACIDVEN 4.1* 3.1*  --     Estimated Creatinine Clearance: 58 mL/min (A) (by C-G formula based on SCr of 1.33 mg/dL (H)).    Allergies  Allergen Reactions  . Benazepril Swelling    angioedema; he is not a candidate for any angiotensin receptor blockers because of this significant allergic reaction. Because of a history of documented adverse serious drug reaction;Medi Alert bracelet  is recommended  . Hctz [Hydrochlorothiazide] Anaphylaxis and Swelling    Tongue and lip swelling   . Aspirin Other (See Comments)    Gastritis, cant take 325 Mg aspirin   . Lactose Intolerance (Gi) Nausea And Vomiting    Thank you for allowing pharmacy to be a part of this patient's care.  Lenis Noon, PharmD, BCPS Clinical Pharmacist 08/17/2017

## 2017-08-17 NOTE — Progress Notes (Signed)
Pharmacy Antibiotic Note  Richard [name] is a 77 y.o. male admitted on 08/16/2017 with sepsis.  Pharmacy has been consulted for vanc/zosyn dosing.  Plan: Patient received vanc 1g and zosyn 3.375g IV x 1 in ED  Will continue vanc 1.25g IV q12h w/ 6 hour stack Will draw vanc trough 02/18 @ 1500 prior to 4th dose. Will continue zosyn 3.375g IV q8h  Ke 0.050 T1/2 12 hrs Goal trough 15 - 20 mcg/mL  Weight: 248 lb (112.5 kg)  Temp (24hrs), Avg:99.4 F (37.4 C), Min:98.6 F (37 C), Max:101.3 F (38.5 C)  Recent Labs  Lab 08/16/17 1829 08/16/17 2014  WBC 0.3*  --   CREATININE 1.41*  --   LATICACIDVEN 4.1* 3.1*    Estimated Creatinine Clearance: 55 mL/min (A) (by C-G formula based on SCr of 1.41 mg/dL (H)).    Allergies  Allergen Reactions  . Benazepril Swelling    angioedema; he is not a candidate for any angiotensin receptor blockers because of this significant allergic reaction. Because of a history of documented adverse serious drug reaction;Medi Alert bracelet  is recommended  . Hctz [Hydrochlorothiazide] Anaphylaxis and Swelling    Tongue and lip swelling   . Aspirin Other (See Comments)    Gastritis, cant take 325 Mg aspirin   . Lactose Intolerance (Gi) Nausea And Vomiting    Thank you for allowing pharmacy to be a part of this patient's care.  Tobie Lords, PharmD, BCPS Clinical Pharmacist 08/17/2017

## 2017-08-17 NOTE — Consult Note (Signed)
Hematology/Oncology Consult note [facility] Telephone:([phone] [phone]  Patient Care Team: Carollee Herter, Alferd Apa, DO as PCP - General (Family Medicine) Minus Breeding, MD as PCP - Cardiology (Cardiology) Dingeldein, Remo Lipps, MD (Ophthalmology) Rigoberto Noel, MD as Consulting Physician (Pulmonary Disease) [name], MD as Consulting Physician (Oncology) Minus Breeding, MD as Consulting Physician (Cardiology) Parrett, Fonnie Mu, NP as Nurse Practitioner (Pulmonary Disease) [facility] (Dermatology) Latanya Maudlin, MD as Consulting Physician (Orthopedic Surgery)   Name of the patient: Richard [name]  [id number]  [birth year]    Reason for consult: DLBCL s/p 8 cycles of RCHOP now admitted for neutropenic sepsis   Requestuing physician: Dr. Anselm Jungling  Date of visit: 08/17/2017   History of presenting illness-, CAD status post CABG and severe aortic stenosis who was diagnosed with back in August.  He is being treated by Dr. Arelia Sneddon at [facility] point. At diagnosis he had b/l cervical, mediastinal and retrocrural adenopathy. Interval PET after 2 cycles showed complete response to treatment. Plan was to give him 8 cycles of RCHOP which he completed on 08/08/17 with neulasta support. Repeat pet scan scheduled in early March 2018.  Patient also has a h/o severe aortic stenosis and is awaiting to undergo TAVR procedure by cardiology after PET/CT.   He presented to the hospital with symptoms of fever, fatigue, generalized fatigue, worsening SOB and BRBPR for last 2-4 weeks  In the ER patient found to be tachycardic, hypotensive, A flutter with 3:1 block. CXR showed stable moderate cardiac enlargement. No pneumonia. CT renal stone study showed: Visualization of the interventricular septum with hypodense appearance of the blood pool compatible with anemia.no acute bowel inflammation. Blood cultures negative so far. UA unremarkable.  On labs he was noted  to have pancytopenia.  08/05/2017 showed white count of 5.1, 9.8/30.5 and a platelet count of 158.,  H&H of 7.8/24.5 with an MCV of 87.3 platelet count of 32.  Differential was not done.  It has been between 8.5-10 over the last 1 month. Troponin trending up. T max 101.3 yesterday evening. T max trending down. Tachycardia improved and BP systolic inn 46'T-035'W  Currently patient reports feeling sob and is on BiPAP and feels better. He has not had any hemorrhoidal bleeding since admission. He does have significant pain and discomfort from his hemorrhoids   Pain scale- 4- hemorrhoidal pain   Review of systems- Review of Systems  Constitutional: Positive for malaise/fatigue. Negative for chills, fever and weight loss.  HENT: Negative for congestion, ear discharge and nosebleeds.   Eyes: Negative for blurred vision.  Respiratory: Positive for shortness of breath. Negative for cough, hemoptysis, sputum production and wheezing.   Cardiovascular: Negative for chest pain, palpitations, orthopnea and claudication.  Gastrointestinal: Negative for abdominal pain, blood in stool, constipation, diarrhea, heartburn, melena, nausea and vomiting.       Hemorrhoidal pain  Genitourinary: Negative for dysuria, flank pain, frequency, hematuria and urgency.  Musculoskeletal: Negative for back pain, joint pain and myalgias.  Skin: Negative for rash.  Neurological: Positive for weakness. Negative for dizziness, tingling, focal weakness, seizures and headaches.  Endo/Heme/Allergies: Does not bruise/bleed easily.  Psychiatric/Behavioral: Negative for depression and suicidal ideas. The patient does not have insomnia.     Allergies  Allergen Reactions  . Benazepril Swelling    angioedema; he is not a candidate for any angiotensin receptor blockers because of this significant allergic reaction. Because of a history of documented adverse serious drug reaction;Medi Alert bracelet  is recommended  .  Hctz  [Hydrochlorothiazide] Anaphylaxis and Swelling    Tongue and lip swelling   . Aspirin Other (See Comments)    Gastritis, cant take 325 Mg aspirin   . Lactose Intolerance (Gi) Nausea And Vomiting    Patient Active Problem List   Diagnosis Date Noted  . GIB (gastrointestinal bleeding) 08/16/2017  . Acute hypoxemic respiratory failure ([place]) 08/16/2017  . Hyperlipidemia 08/05/2017  . NSTEMI (non-ST elevated myocardial infarction) ([place])   . Aortic stenosis, severe 07/25/2017  . Pancytopenia ([place]) 07/24/2017  . 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 ([place]) 02/27/2017  . Bradycardia 01/04/2017  . Coronary artery disease 01/04/2017  . Stenosis of carotid artery 01/04/2017  . Obesity 09/18/2016  . Wenckebach block   . Spinal stenosis, lumbar region, with neurogenic claudication 06/17/2013  . DM (diabetes mellitus) type II uncontrolled, periph vascular disorder ([place]) 05/21/2013  . Spinal stenosis of lumbar region 04/08/2013  . Anemia, iron deficiency 11/29/2011  . B12 deficiency anemia 07/30/2011  . OSA (obstructive sleep apnea) 07/06/2010  . CAD, ARTERY BYPASS GRAFT 08/22/2009  . Essential hypertension 03/29/2009  . Bilateral lower extremity edema 02/21/2009  . Hyperlipidemia LDL goal <70 11/26/2008     Past Medical History:  Diagnosis Date  . Anemia   . Axillary adenopathy 02/25/2017  . Bradycardia    a. holter monitor has demonstrated HRs in 30s and Weinkibach   . CAD (coronary artery disease)    a. s/p CABG 2001  . Chronic lower back pain   . Diffuse large B cell lymphoma ([place])   . Diverticulosis   . Esophageal stricture   . GERD (gastroesophageal reflux disease)   . History of gout   . HTN (hypertension)   . Mixed hyperlipidemia   . OSA on CPAP    with 2L O2 at night  . Pancytopenia ([place])    a. related to chemo therapy for B cell lymphoma  . Peptic stricture of esophagus   . Severe aortic stenosis   . Spinal stenosis   . Type II diabetes mellitus  ([place])      Past Surgical History:  Procedure Laterality Date  . APPENDECTOMY  ~ 1952  . BACK SURGERY    . CATARACT EXTRACTION W/PHACO Left 11/06/2016   Procedure: CATARACT EXTRACTION PHACO AND INTRAOCULAR LENS PLACEMENT (IOC);  Surgeon: Estill Cotta, MD;  Location: [facility] ORS;  Service: Ophthalmology;  Laterality: Left;  Lot # [id number] H [place]: 01:09.4 AP%:25.2 CDE: 30.64  . CATARACT EXTRACTION W/PHACO Right 12/04/2016   Procedure: CATARACT EXTRACTION PHACO AND INTRAOCULAR LENS PLACEMENT (IOC);  Surgeon: Estill Cotta, MD;  Location: [facility] ORS;  Service: Ophthalmology;  Laterality: Right;  [place] 1:25.9 AP% 24.1 CDE 39.10 Fluid Pack lot # [id number]  . COLONOSCOPY W/ BIOPSIES AND POLYPECTOMY  2013  . CORONARY ANGIOPLASTY  1993  . CORONARY ANGIOPLASTY WITH STENT PLACEMENT  05/1997   "1"  . CORONARY ARTERY BYPASS GRAFT  03/2000   "CABG X5"  . ESOPHAGEAL DILATION  X 3-4   Dr. Lyla Son; "last one was in the 1990's"  . ESOPHAGOGASTRODUODENOSCOPY     multiple  . FLEXIBLE SIGMOIDOSCOPY     multiple  . HYDRADENITIS EXCISION Left 02/25/2017   Procedure: EXCISION DEEP LEFT AXILLARY LYMPH NODE;  Surgeon: Fanny Skates, MD;  Location: [facility];  Service: General;  Laterality: Left;  . KNEE ARTHROSCOPY Left 2011   meniscus repair  . LEFT HEART CATHETERIZATION WITH CORONARY/GRAFT ANGIOGRAM N/A 03/16/2014   Procedure: LEFT HEART CATHETERIZATION WITH CORONARY/GRAFT ANGIOGRAM;  Surgeon:  Burnell Blanks, MD;  Location: [facility];  Service: Cardiovascular;  Laterality: N/A;  . LUMBAR LAMINECTOMY/DECOMPRESSION MICRODISCECTOMY Right 06/17/2013   Procedure: LUMBAR LAMINECTOMY MICRODISCECTOMY L4-L5 RIGHT EXCISION OF SYNOVIAL CYST RIGHT   (1 LEVEL) RIGHT PARTIAL FACETECTOMY;  Surgeon: Tobi Bastos, MD;  Location: [facility];  Service: Orthopedics;  Laterality: Right;  . MYELOGRAM  04/06/13   lumbar, Dr Gladstone Lighter  . PANENDOSCOPY    . PORTACATH PLACEMENT N/A 03/06/2017   Procedure: INSERTION PORT-A-CATH  AND ASPIRATE SEROMA LEFT AXILLA;  Surgeon: Fanny Skates, MD;  Location: [facility];  Service: General;  Laterality: N/A;  . RIGHT/LEFT HEART CATH AND CORONARY/GRAFT ANGIOGRAPHY N/A 07/28/2017   Procedure: RIGHT/LEFT HEART CATH AND CORONARY/GRAFT ANGIOGRAPHY;  Surgeon: Sherren Mocha, MD;  Location: [facility];  Service: Cardiovascular;  Laterality: N/A;  . SHOULDER SURGERY Right 08/2010   screws placed; "tendons tore off"  . SKIN CANCER EXCISION Right    "neck"  . TONSILLECTOMY  ~ 1954  . UPPER GI ENDOSCOPY  2013   Gastritis; Dr Carlean Purl  . VASECTOMY      Social History   Socioeconomic History  . Marital status: Divorced    Spouse name: Not on file  . Number of children: 2  . Years of education: Not on file  . Highest education level: Not on file  Social Needs  . Financial resource strain: Not on file  . Food insecurity - worry: Not on file  . Food insecurity - inability: Not on file  . Transportation needs - medical: Not on file  . Transportation needs - non-medical: Not on file  Occupational History    Employer: RETIRED  Tobacco Use  . Smoking status: Never Smoker  . Smokeless tobacco: Never Used  Substance and Sexual Activity  . Alcohol use: No    Alcohol/week: 0.0 oz    Comment: "last drink was in 2012"( 03/15/2014)  . Drug use: No  . Sexual activity: Not Currently  Other Topics Concern  . Not on file  Social History Narrative   Divorced, lives with a roommate.   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     Family History  Problem Relation Age of Onset  . Stroke Father   . Hypertension Father   . Pancreatic cancer Mother   . Diabetes Maternal Grandmother   . Stroke Maternal Grandmother   . Heart attack Paternal Grandmother   . Colon cancer Neg Hx   . Esophageal cancer Neg Hx   .  Rectal cancer Neg Hx   . Stomach cancer Neg Hx   . Ulcers Neg Hx      Current Facility-Administered Medications:  .  0.9 %  sodium chloride infusion, , Intravenous, Continuous, Salary, Montell D, MD .  acetaminophen (TYLENOL) tablet 650 mg, 650 mg, Oral, Q6H PRN, 650 mg at 08/17/17 0231 **OR** acetaminophen (TYLENOL) suppository 650 mg, 650 mg, Rectal, Q6H PRN, Salary, Montell D, MD .  acetaminophen (TYLENOL) tablet 650 mg, 650 mg, Oral, Once, Salary, Montell D, MD .  allopurinol (ZYLOPRIM) tablet 450 mg, 450 mg, Oral, Daily, Salary, Montell D, MD, 450 mg at 08/17/17 0908 .  atorvastatin (LIPITOR) tablet 80 mg, 80 mg, Oral, QHS, Salary, Montell D, MD .  chlorhexidine (PERIDEX) 0.12 % solution 15 mL, 15 mL, Mouth Rinse, BID, Celesta Aver,  Berton Mount, MD, 15 mL at 08/17/17 0908 .  diphenhydrAMINE (BENADRYL) capsule 25 mg, 25 mg, Oral, Once, Salary, Montell D, MD .  diphenoxylate-atropine (LOMOTIL) 2.5-0.025 MG per tablet 1 tablet, 1 tablet, Oral, QID PRN, Salary, Montell D, MD .  ezetimibe (ZETIA) tablet 10 mg, 10 mg, Oral, Daily, Salary, Montell D, MD, 10 mg at 08/17/17 0909 .  fluticasone (FLONASE) 50 MCG/ACT nasal spray 1 spray, 1 spray, Each Nare, Daily, Salary, Montell D, MD .  furosemide (LASIX) injection 20 mg, 20 mg, Intravenous, PRN, Salary, Montell D, MD, 20 mg at 08/17/17 0905 .  glimepiride (AMARYL) tablet 2 mg, 2 mg, Oral, Q breakfast, Salary, Montell D, MD, 2 mg at 08/17/17 0830 .  HYDROcodone-acetaminophen (NORCO/VICODIN) 5-325 MG per tablet 1-2 tablet, 1-2 tablet, Oral, Q4H PRN, Salary, Montell D, MD, 1 tablet at 08/17/17 0840 .  hydrocortisone (ANUSOL-HC) suppository 25 mg, 25 mg, Rectal, BID, Salary, Montell D, MD, 25 mg at 08/17/17 0911 .  HYDROmorphone (DILAUDID) injection 1 mg, 1 mg, Intravenous, Q4H PRN, Tukov, Magadalene S, NP .  insulin aspart (novoLOG) injection 0-20 Units, 0-20 Units, Subcutaneous, TID WC, Salary, Montell D, MD, 3 Units at 08/17/17 0827 .  insulin aspart  (novoLOG) injection 0-5 Units, 0-5 Units, Subcutaneous, QHS, Salary, Montell D, MD .  lidocaine-prilocaine (EMLA) cream 1 application, 1 application, Topical, PRN, Salary, Montell D, MD .  lipase/protease/amylase (CREON) capsule 72,000 Units, 72,000 Units, Oral, TID WC, Salary, Montell D, MD, 72,000 Units at 08/17/17 0835 .  LORazepam (ATIVAN) tablet 0.5 mg, 0.5 mg, Oral, Q6H PRN, Salary, Montell D, MD .  MEDLINE mouth rinse, 15 mL, Mouth Rinse, q12n4p, [place] Dragon, MD .  nitroGLYCERIN (NITROSTAT) SL tablet 0.4 mg, 0.4 mg, Sublingual, Q5 min PRN, Salary, Montell D, MD .  ondansetron (ZOFRAN) tablet 8 mg, 8 mg, Oral, BID PRN, Salary, Montell D, MD .  pantoprazole (PROTONIX) EC tablet 40 mg, 40 mg, Oral, Daily, Salary, Montell D, MD, 40 mg at 08/17/17 0910 .  piperacillin-tazobactam (ZOSYN) IVPB 3.375 g, 3.375 g, Intravenous, Q8H, Amelia Jo, MD, Stopped at 08/17/17 0800 .  polycarbophil (FIBERCON) tablet 625 mg, 625 mg, Oral, Daily, Salary, Montell D, MD, 625 mg at 08/17/17 0909 .  pregabalin (LYRICA) capsule 75 mg, 75 mg, Oral, TID, Salary, Montell D, MD, 75 mg at 08/17/17 0910 .  prochlorperazine (COMPAZINE) tablet 10 mg, 10 mg, Oral, Q6H PRN, Salary, Montell D, MD .  promethazine (PHENERGAN) tablet 12.5 mg, 12.5 mg, Oral, Q6H PRN, Salary, Montell D, MD .  valACYclovir (VALTREX) tablet 1,000 mg, 1,000 mg, Oral, Daily, Salary, Montell D, MD, 1,000 mg at 08/17/17 0909 .  vancomycin (VANCOCIN) 1,250 mg in sodium chloride 0.9 % 250 mL IVPB, 1,250 mg, Intravenous, Q12H, Amelia Jo, MD, Stopped at 08/17/17 0600  Facility-Administered Medications Ordered in Other Encounters:  .  sodium chloride flush (NS) 0.9 % injection 10 mL, 10 mL, Intravenous, PRN, [name], MD, 10 mL at 05/30/17 0920   Physical exam:  Vitals:   08/17/17 1300 08/17/17 1400 08/17/17 1500 08/17/17 1515  BP: 103/66 104/64 107/65   Pulse: 71 71 95 88  Resp: 20 (!) 21 (!) 30 20  Temp:      TempSrc:      SpO2:  92% 93% (!) 87% 90%  Weight:      Height:       Physical Exam  Constitutional: He is oriented to person, place, and time.  He appears comfortable on BiPAP  HENT:  Head: Normocephalic and atraumatic.  Eyes: EOM are normal. Pupils are equal, round, and reactive to light.  Neck: Normal range of motion.  Cardiovascular: Normal rate and regular rhythm.  Ejection systolic murmur +  Pulmonary/Chest: Effort normal and breath sounds normal.  Abdominal: Soft. Bowel sounds are normal.  Neurological: He is alert and oriented to person, place, and time.  Skin: Skin is warm and dry.       CMP Latest Ref Rng & Units 08/17/2017  Glucose 65 - 99 mg/dL 142(H)  BUN 6 - 20 mg/dL 20  Creatinine 0.61 - 1.24 mg/dL 1.33(H)  Sodium 135 - 145 mmol/L 135  Potassium 3.5 - 5.1 mmol/L 3.5  Chloride 101 - 111 mmol/L 106  CO2 22 - 32 mmol/L 20(L)  Calcium 8.9 - 10.3 mg/dL 7.3(L)  Total Protein 6.5 - 8.1 g/dL 5.7(L)  Total Bilirubin 0.3 - 1.2 mg/dL 1.2  Alkaline Phos 38 - 126 U/L 55  AST 15 - 41 U/L 28  ALT 17 - 63 U/L 16(L)   CBC Latest Ref Rng & Units 08/17/2017  WBC 3.8 - 10.6 K/uL 0.3(LL)  Hemoglobin 13.0 - 18.0 g/dL 8.4(L)  Hematocrit 40.0 - 52.0 % 26.1(L)  Platelets 150 - 440 K/uL 38(L)    @IMAGES @  Dg Chest 2 View  Result Date: 07/23/2017 CLINICAL DATA:  Chest pain EXAM: CHEST  2 VIEW COMPARISON:  PET-CT 04/15/2017, radiograph 03/06/2017 FINDINGS: Right-sided central venous port tip overlies the SVC. Post sternotomy changes. Cardiomegaly. Mild coarse likely chronic interstitial opacity. No acute consolidation or effusion. Aortic atherosclerosis. No pneumothorax. Surgical clips in the left axilla. IMPRESSION: No active cardiopulmonary disease.  Stable mild cardiomegaly. Electronically Signed   By: Donavan Foil M.D.   On: 07/23/2017 22:22   Ct Coronary Morph W/cta Cor W/score W/ca W/cm &/or Wo/cm  Addendum Date: 07/29/2017   ADDENDUM REPORT: 07/29/2017 13:40 CLINICAL DATA:  Aortic stenosis  EXAM: Cardiac TAVR CT TECHNIQUE: The patient was scanned on a Siemens Force 948 slice scanner. A 120 kV retrospective scan was triggered in the ascending thoracic aorta at 140 HU's. Gantry rotation speed was 270 msecs and collimation was .9 mm. No beta blockade or nitro were given. The 3D data set was reconstructed in 5% intervals of the R-R cycle. Systolic and diastolic phases were analyzed on a dedicated work station using MPR, MIP and VRT modes. The patient received 80 cc of contrast. FINDINGS: Aortic Valve: Tri leaflet severely calcified with restricted motion Aorta: Moderate calcific atherosclerotic debris Sinotubular Junction: 30 mm Ascending Thoracic Aorta: 37 mm Aortic Arch: 29 mm Descending Thoracic Aorta: 30 mm Sinus of Valsalva Measurements: Non-coronary: 32 mm Right -coronary: 30 mm Left -coronary: 31 mm Coronary Artery Height above Annulus: Left Main: 10.6 mm above annulus Right Coronary: 13.4 mm above annulus Virtual Basal Annulus Measurements: Maximum/Minimum Diameter: 27.5 mm x 21.9 mm Perimeter: 77.7 mm Area: 476 mm2 Coronary Arteries: Sufficient height above annulus for deployment. There is a patent RIMA to PDA, Patent LIMA to LAD, Patent sequential SVG to IM/OM and Occluded SVG to diagonal Optimum Fluoroscopic Angle for Delivery: LAO 11 degrees Caudal 14 degrees IMPRESSION: 1. Calcified tri leaflet AV with annulus 476 mm2 suitable for a 26 mm Sapien 3 valve 2. Optimum angiographic angle for delivery LAO 11 degrees Caudal 14 degrees 3. Coronary arteries sufficient height above annulus for deployment Patent RIMA to PDA, Patent LIMA to LAD, Patent sequential SVG IM/OM Occluded SVG Diagonal 4.  No LAA thrombus Jenkins Rouge Electronically Signed  By: Jenkins Rouge M.D.   On: 07/29/2017 13:40   Result Date: 07/29/2017 EXAM: OVER-READ INTERPRETATION  CT CHEST The following report is an over-read performed by radiologist Dr. Vinnie Langton of [facility], [place] on 07/29/2017. This over-read does  not include interpretation of cardiac or coronary anatomy or pathology. The coronary CTA interpretation by the cardiologist is attached. COMPARISON:  None. FINDINGS: Extracardiac findings will be described under separate dictation for contemporaneously obtained CTA chest, abdomen and pelvis. IMPRESSION: Please see separate dictation for contemporaneously obtained CTA chest, abdomen and pelvis dated 07/29/2017 for full description of relevant extracardiac findings. Electronically Signed: By: Vinnie Langton M.D. On: 07/29/2017 13:12   Dg Chest Port 1 View  Result Date: 08/17/2017 CLINICAL DATA:  Acute respiratory failure. EXAM: PORTABLE CHEST 1 VIEW COMPARISON:  08/16/2017 FINDINGS: Right subclavian Port-A-Cath unchanged with tip over the SVC just above the cavoatrial junction. Lungs are adequately inflated without focal consolidation or effusion. Remainder of the exam is unchanged. IMPRESSION: No acute cardiopulmonary disease. Right subclavian Port-A-Cath unchanged. Electronically Signed   By: Marin Olp M.D.   On: 08/17/2017 08:38   Dg Chest Portable 1 View  Result Date: 08/16/2017 CLINICAL DATA:  Diarrhea, shortness of breath for 2 days. EXAM: PORTABLE CHEST 1 VIEW COMPARISON:  07/23/2017 FINDINGS: The right chest wall port a catheter is noted with tip at the cavoatrial junction. Previous median sternotomy and CABG procedure. Aortic atherosclerosis noted. Stable moderate cardiac enlargement. No pleural effusion or edema. No airspace opacities identified. IMPRESSION: 1. Cardiac enlargement.  No acute findings. 2.  Aortic Atherosclerosis (ICD10-I70.0). Electronically Signed   By: Kerby Moors M.D.   On: 08/16/2017 19:20   Ct Angio Chest Aorta W/cm &/or Wo/cm  Result Date: 07/29/2017 CLINICAL DATA:  77 year old male with history of aortic stenosis. Preprocedural study prior to potential transcatheter aortic valve replacement (TAVR) procedure. EXAM: CT ANGIOGRAPHY CHEST, ABDOMEN AND PELVIS TECHNIQUE:  Multidetector CT imaging through the chest, abdomen and pelvis was performed using the standard protocol during bolus administration of intravenous contrast. Multiplanar reconstructed images and MIPs were obtained and reviewed to evaluate the vascular anatomy. CONTRAST:  31mL ISOVUE-370 IOPAMIDOL (ISOVUE-370) INJECTION 76% COMPARISON:  PET-CT 04/15/2017. FINDINGS: CTA CHEST FINDINGS Cardiovascular: Heart size is mildly enlarged. There is no significant pericardial fluid, thickening or pericardial calcification. There is aortic atherosclerosis, as well as atherosclerosis of the great vessels of the mediastinum and the coronary arteries, including calcified atherosclerotic plaque in the left main, left anterior descending, left circumflex and right coronary arteries. Status post median sternotomy for CABG including LIMA to the LAD. Severe thickening calcification of the aortic valve. Right internal jugular single-lumen porta cath with tip terminating in the right atrium. Mediastinum/Lymph Nodes: No pathologically enlarged mediastinal or hilar lymph nodes. Esophagus is unremarkable in appearance. No axillary lymphadenopathy. Surgical clips are noted in the left axillary region from prior lymph node dissection. Superficial to this there is a 4.2 x 3.2 cm low-attenuation lesion, likely to represent a small postoperative seroma. Lungs/Pleura: No acute consolidative airspace disease. No pleural effusions. No suspicious appearing pulmonary nodules or masses. Musculoskeletal/Soft Tissues: Median sternotomy wires. There are no aggressive appearing lytic or blastic lesions noted in the visualized portions of the skeleton. CTA ABDOMEN AND PELVIS FINDINGS Hepatobiliary: No suspicious cystic or solid hepatic lesions. No intra or extrahepatic biliary ductal dilatation. Gallbladder is normal in appearance. Pancreas: No pancreatic mass. No pancreatic ductal dilatation. No pancreatic or peripancreatic fluid or inflammatory changes.  Spleen: Unremarkable. Adrenals/Urinary Tract: Bilateral kidneys  and bilateral adrenal glands are normal in appearance. No hydroureteronephrosis. Urinary bladder is mildly trabeculated, but otherwise unremarkable in appearance. Stomach/Bowel: Normal appearance of the stomach. No pathologic dilatation of small bowel or colon. The appendix is not confidently identified and may be surgically absent. Regardless, there are no inflammatory changes noted adjacent to the cecum to suggest the presence of an acute appendicitis at this time. Vascular/Lymphatic: Vascular findings and measurements relevant to potential TAVR procedure, as detailed below. Aortic atherosclerosis, without evidence of aneurysm. Aneurysmal dilatation of the right common iliac artery which measures up to 2 cm in diameter. Celiac axis, superior mesenteric artery and inferior mesenteric artery are all widely patent without hemodynamically significant stenosis. Two right renal arteries and one left renal artery are all widely patent without hemodynamically significant stenosis. No lymphadenopathy noted in the abdomen or pelvis. Reproductive: Prostate gland and seminal vesicles are unremarkable in appearance. Other: No significant volume of ascites.  No pneumoperitoneum. Musculoskeletal: There are no aggressive appearing lytic or blastic lesions noted in the visualized portions of the skeleton. VASCULAR MEASUREMENTS PERTINENT TO TAVR: AORTA: Minimal Aortic Diameter-12 x 15 mm Severity of Aortic Calcification-moderate to severe RIGHT PELVIS: Right Common Iliac Artery - Minimal Diameter-9.8 x 7.8 mm Tortuosity-mild-to-moderate Calcification-mild Right External Iliac Artery - Minimal Diameter-10.1 x 9.2 mm Tortuosity-moderate Calcification-mild Right Common Femoral Artery - Minimal Diameter-10.0 x 9.0 mm Tortuosity-mild Calcification-mild LEFT PELVIS: Left Common Iliac Artery - Minimal Diameter-8.8 x 7.5 mm Tortuosity-mild Calcification-mild-to-moderate Left  External Iliac Artery - Minimal Diameter-10.1 x 8.8 mm Tortuosity-mild Calcification-mild Left Common Femoral Artery - Minimal Diameter-10.6 x 10.1 mm Tortuosity-mild Calcification-mild Review of the MIP images confirms the above findings. IMPRESSION: 1. Vascular findings and measurements pertinent to potential TAVR procedure, as detailed above. 2. Severe thickening calcification of the aortic valve, compatible with the reported clinical history of severe aortic stenosis. 3. Aortic atherosclerosis, in addition to left main and 3 vessel coronary artery disease. Status post median sternotomy for CABG including LIMA to the LAD. 4. Mild aneurysmal dilatation of the right common iliac artery which measures up to 2.0 cm in diameter. 5. Additional incidental findings, as above. Aortic Atherosclerosis (ICD10-I70.0). Electronically Signed   By: Vinnie Langton M.D.   On: 07/29/2017 15:52   Ct Renal Stone Study  Result Date: 08/16/2017 CLINICAL DATA:  Diarrhea and dyspnea x2 days. EXAM: CT ABDOMEN AND PELVIS WITHOUT CONTRAST TECHNIQUE: Multidetector CT imaging of the abdomen and pelvis was performed following the standard protocol without IV contrast. COMPARISON:  None. FINDINGS: Lower chest: Low-density appearance of the blood pool within the cardiac chambers with visualization of the interventricular septum compatible with anemia. Bibasilar dependent atelectasis. Small hiatal hernia. Hepatobiliary: No focal liver abnormality is seen. No gallstones, gallbladder wall thickening, or biliary dilatation. Pancreas: Unremarkable. No pancreatic ductal dilatation or surrounding inflammatory changes. Spleen: Normal in size without focal abnormality. Adrenals/Urinary Tract: Normal bilateral adrenal glands. No nephrolithiasis nor obstructive uropathy. Physiologic distention of the urinary bladder without focal mural thickening or calculus. Several small bladder diverticula are noted along the dome and posterior wall.  Stomach/Bowel: Stomach is within normal limits. Appendix is surgically absent by report. No evidence of bowel wall thickening, distention, or inflammatory changes. Vascular/Lymphatic: Moderate aortoiliac atherosclerosis without aneurysm. Atherosclerotic calcifications at the origins of both renal arteries without significant renal cortical scarring. Mild ectasia of the right common iliac artery to 2.2 cm. No pelvic sidewall, inguinal nor abdominal adenopathy. Reproductive: Mild enlargement of the prostate impressing upon the base of the bladder measuring up  to 5.9 cm craniocaudad. Normal seminal vesicles. Other: No free air nor free fluid. Musculoskeletal: No acute nor suspicious osseous abnormalities. IMPRESSION: 1. Visualization of the interventricular septum with hypodense appearance of the blood pool compatible with anemia. 2. Bladder diverticulosis. 3. No acute bowel inflammation or inflammation. 4. Moderate aortoiliac and branch vessel atherosclerosis. Electronically Signed   By: Ashley Royalty M.D.   On: 08/16/2017 20:24   Ct Angio Abd/pel W/ And/or W/o  Result Date: 07/29/2017 CLINICAL DATA:  77 year old male with history of aortic stenosis. Preprocedural study prior to potential transcatheter aortic valve replacement (TAVR) procedure. EXAM: CT ANGIOGRAPHY CHEST, ABDOMEN AND PELVIS TECHNIQUE: Multidetector CT imaging through the chest, abdomen and pelvis was performed using the standard protocol during bolus administration of intravenous contrast. Multiplanar reconstructed images and MIPs were obtained and reviewed to evaluate the vascular anatomy. CONTRAST:  61mL ISOVUE-370 IOPAMIDOL (ISOVUE-370) INJECTION 76% COMPARISON:  PET-CT 04/15/2017. FINDINGS: CTA CHEST FINDINGS Cardiovascular: Heart size is mildly enlarged. There is no significant pericardial fluid, thickening or pericardial calcification. There is aortic atherosclerosis, as well as atherosclerosis of the great vessels of the mediastinum and the  coronary arteries, including calcified atherosclerotic plaque in the left main, left anterior descending, left circumflex and right coronary arteries. Status post median sternotomy for CABG including LIMA to the LAD. Severe thickening calcification of the aortic valve. Right internal jugular single-lumen porta cath with tip terminating in the right atrium. Mediastinum/Lymph Nodes: No pathologically enlarged mediastinal or hilar lymph nodes. Esophagus is unremarkable in appearance. No axillary lymphadenopathy. Surgical clips are noted in the left axillary region from prior lymph node dissection. Superficial to this there is a 4.2 x 3.2 cm low-attenuation lesion, likely to represent a small postoperative seroma. Lungs/Pleura: No acute consolidative airspace disease. No pleural effusions. No suspicious appearing pulmonary nodules or masses. Musculoskeletal/Soft Tissues: Median sternotomy wires. There are no aggressive appearing lytic or blastic lesions noted in the visualized portions of the skeleton. CTA ABDOMEN AND PELVIS FINDINGS Hepatobiliary: No suspicious cystic or solid hepatic lesions. No intra or extrahepatic biliary ductal dilatation. Gallbladder is normal in appearance. Pancreas: No pancreatic mass. No pancreatic ductal dilatation. No pancreatic or peripancreatic fluid or inflammatory changes. Spleen: Unremarkable. Adrenals/Urinary Tract: Bilateral kidneys and bilateral adrenal glands are normal in appearance. No hydroureteronephrosis. Urinary bladder is mildly trabeculated, but otherwise unremarkable in appearance. Stomach/Bowel: Normal appearance of the stomach. No pathologic dilatation of small bowel or colon. The appendix is not confidently identified and may be surgically absent. Regardless, there are no inflammatory changes noted adjacent to the cecum to suggest the presence of an acute appendicitis at this time. Vascular/Lymphatic: Vascular findings and measurements relevant to potential TAVR  procedure, as detailed below. Aortic atherosclerosis, without evidence of aneurysm. Aneurysmal dilatation of the right common iliac artery which measures up to 2 cm in diameter. Celiac axis, superior mesenteric artery and inferior mesenteric artery are all widely patent without hemodynamically significant stenosis. Two right renal arteries and one left renal artery are all widely patent without hemodynamically significant stenosis. No lymphadenopathy noted in the abdomen or pelvis. Reproductive: Prostate gland and seminal vesicles are unremarkable in appearance. Other: No significant volume of ascites.  No pneumoperitoneum. Musculoskeletal: There are no aggressive appearing lytic or blastic lesions noted in the visualized portions of the skeleton. VASCULAR MEASUREMENTS PERTINENT TO TAVR: AORTA: Minimal Aortic Diameter-12 x 15 mm Severity of Aortic Calcification-moderate to severe RIGHT PELVIS: Right Common Iliac Artery - Minimal Diameter-9.8 x 7.8 mm Tortuosity-mild-to-moderate Calcification-mild Right External Iliac Artery -  Minimal Diameter-10.1 x 9.2 mm Tortuosity-moderate Calcification-mild Right Common Femoral Artery - Minimal Diameter-10.0 x 9.0 mm Tortuosity-mild Calcification-mild LEFT PELVIS: Left Common Iliac Artery - Minimal Diameter-8.8 x 7.5 mm Tortuosity-mild Calcification-mild-to-moderate Left External Iliac Artery - Minimal Diameter-10.1 x 8.8 mm Tortuosity-mild Calcification-mild Left Common Femoral Artery - Minimal Diameter-10.6 x 10.1 mm Tortuosity-mild Calcification-mild Review of the MIP images confirms the above findings. IMPRESSION: 1. Vascular findings and measurements pertinent to potential TAVR procedure, as detailed above. 2. Severe thickening calcification of the aortic valve, compatible with the reported clinical history of severe aortic stenosis. 3. Aortic atherosclerosis, in addition to left main and 3 vessel coronary artery disease. Status post median sternotomy for CABG including  LIMA to the LAD. 4. Mild aneurysmal dilatation of the right common iliac artery which measures up to 2.0 cm in diameter. 5. Additional incidental findings, as above. Aortic Atherosclerosis (ICD10-I70.0). Electronically Signed   By: Vinnie Langton M.D.   On: 07/29/2017 15:52    Assessment and plan- Patient is a 77 y.o. 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 Last chemo on 08/08/17 admitted for neutropenic fever and sepsis. Source unclear  1. Neutropenic fever- CXR, UA and blood cultures reveal no clear source. No acute pathology noted on CXR and CT abdomen. Agree continuing IVF and IV antibiotics. If he has another fever- please repeat blood cultures and have ID on board.consider doing CT thorax (with or without contrast depending on renal functions).  Please obtain CBC with DIFFERENTIAL DAILY. He is 9 days post chemotherapy and his blood counts are at their nadir at this point and expected to recover in the next few days. He already received neulasta on 08/08/17. I would hold off on neupogen at this time. If blood counts do not improve in next couple of days- I will offer neupogen at that time.   2. Pancytopenia- likely due to chemotherapy. Please check iron studies, ferritin, b12, folate, retic count. Transfuse if H/H <7/21 (although patient may need a higher threshold from cardiac standpoint) or platelets <10.   3. Possible GI bleed- given that patient has severe leukopenia (and therefore neutropenia) and neutropenic fever, I would recommend holding off on any invasive procedures like colonoscopy unless he has active significant GI bleeding at this time. GI on board. His anemia could be very welll due to chemotherapy and acute sepsis. Try non suppository measures for hemorrhoidal pain  4. Acute hypoxic respiratory failure- multifactorial secondary to neutropenic sepsis, severe aortic valve stenosis and underlying CAD with supply demand ischemia. Given his  age, multiple cardiovascular risk factors and recent anthracycline based chemotherapy, he is at a risk for heart failure and I would recommend checking repeat ECHO at this time. Prior ECHO from Nov 2018 showed EF of 55-60%   Will continue to follow.   Visit Diagnosis 1. Severe sepsis with septic shock (HCC)   2. Pancytopenia ([place])   3. Neutropenic fever ([place])   4. Occult GI bleeding   5. Demand ischemia of myocardium ([place])   6. Exertional dyspnea   7. External hemorrhoid   8. Acute respiratory failure (HCC)     Dr. Randa Evens, MD, MPH [facility] [place] Pager- [phone] 08/17/2017  3:53 PM

## 2017-08-18 ENCOUNTER — Other Ambulatory Visit: Payer: Self-pay | Admitting: Family Medicine

## 2017-08-18 ENCOUNTER — Inpatient Hospital Stay (HOSPITAL_COMMUNITY)
Admit: 2017-08-18 | Discharge: 2017-08-18 | Disposition: A | Discharge status: Home / Self Care | Payer: Medicare Other | Attending: Internal Medicine | Admitting: Internal Medicine

## 2017-08-18 DIAGNOSIS — D649 Anemia, unspecified: Secondary | ICD-10-CM

## 2017-08-18 DIAGNOSIS — K649 Unspecified hemorrhoids: Secondary | ICD-10-CM

## 2017-08-18 DIAGNOSIS — K625 Hemorrhage of anus and rectum: Secondary | ICD-10-CM

## 2017-08-18 DIAGNOSIS — I340 Nonrheumatic mitral (valve) insufficiency: Secondary | ICD-10-CM

## 2017-08-18 DIAGNOSIS — A419 Sepsis, unspecified organism: Secondary | ICD-10-CM

## 2017-08-18 DIAGNOSIS — R531 Weakness: Secondary | ICD-10-CM

## 2017-08-18 DIAGNOSIS — R197 Diarrhea, unspecified: Secondary | ICD-10-CM

## 2017-08-18 DIAGNOSIS — B999 Unspecified infectious disease: Secondary | ICD-10-CM

## 2017-08-18 DIAGNOSIS — R309 Painful micturition, unspecified: Secondary | ICD-10-CM

## 2017-08-18 DIAGNOSIS — J9601 Acute respiratory failure with hypoxia: Secondary | ICD-10-CM

## 2017-08-18 LAB — ECHOCARDIOGRAM COMPLETE
HEIGHTINCHES: 72 in
WEIGHTICAEL: 3922.42 [oz_av]

## 2017-08-18 LAB — TYPE AND SCREEN
ABO/RH(D): A POS
ANTIBODY SCREEN: NEGATIVE
Unit division: 0
Unit division: 0

## 2017-08-18 LAB — BPAM RBC
Blood Product Expiration Date: 201903072359
Blood Product Expiration Date: 201903072359
ISSUE DATE / TIME: 201902170222
ISSUE DATE / TIME: 201902170528
UNIT TYPE AND RH: 6200
Unit Type and Rh: 6200

## 2017-08-18 LAB — IRON AND TIBC
IRON: 11 ug/dL — AB (ref 45–182)
Saturation Ratios: 6 % — ABNORMAL LOW (ref 17.9–39.5)
TIBC: 199 ug/dL — ABNORMAL LOW (ref 250–450)
UIBC: 188 ug/dL

## 2017-08-18 LAB — PREPARE PLATELET PHERESIS: UNIT DIVISION: 0

## 2017-08-18 LAB — BASIC METABOLIC PANEL
ANION GAP: 9 (ref 5–15)
BUN: 16 mg/dL (ref 6–20)
CHLORIDE: 104 mmol/L (ref 101–111)
CO2: 22 mmol/L (ref 22–32)
Calcium: 7.1 mg/dL — ABNORMAL LOW (ref 8.9–10.3)
Creatinine, Ser: 1.22 mg/dL (ref 0.61–1.24)
GFR calc Af Amer: >60 mL/min (ref >60)
GFR calc non Af Amer: 55 mL/min — ABNORMAL LOW (ref >60)
Glucose, Bld: 107 mg/dL — ABNORMAL HIGH (ref 65–99)
POTASSIUM: 3.2 mmol/L — AB (ref 3.5–5.1)
SODIUM: 135 mmol/L (ref 135–145)

## 2017-08-18 LAB — GLUCOSE, CAPILLARY
GLUCOSE-CAPILLARY: 97 mg/dL (ref 65–99)
GLUCOSE-CAPILLARY: 127 mg/dL — AB (ref 65–99)
GLUCOSE-CAPILLARY: 151 mg/dL — AB (ref 65–99)
GLUCOSE-CAPILLARY: 114 mg/dL — AB (ref 65–99)

## 2017-08-18 LAB — URINE CULTURE
Culture: NO GROWTH
Special Requests: NORMAL

## 2017-08-18 LAB — VITAMIN B12: Vitamin B-12: 568 pg/mL (ref 180–914)

## 2017-08-18 LAB — CBC WITH DIFFERENTIAL/PLATELET
BASOS ABS: 0 10*3/uL (ref 0–0.1)
BASOS PCT: 0 %
Eosinophils Absolute: 0 10*3/uL (ref 0–0.7)
Eosinophils Relative: 5 %
HCT: 25.3 % — ABNORMAL LOW (ref 40.0–52.0)
HEMOGLOBIN: 8.3 g/dL — AB (ref 13.0–18.0)
Lymphocytes Relative: 15 %
Lymphs Abs: 0.1 10*3/uL — ABNORMAL LOW (ref 1.0–3.6)
MCH: 28.7 pg (ref 26.0–34.0)
MCHC: 32.9 g/dL (ref 32.0–36.0)
MCV: 87.3 fL (ref 80.0–100.0)
MONOS PCT: 28 %
Monocytes Absolute: 0.1 10*3/uL — ABNORMAL LOW (ref 0.2–1.0)
NEUTROS PCT: 52 %
Neutro Abs: 0.2 10*3/uL — ABNORMAL LOW (ref 1.4–6.5)
Platelets: 31 10*3/uL — ABNORMAL LOW (ref 150–440)
RBC: 2.9 MIL/uL — ABNORMAL LOW (ref 4.40–5.90)
RDW: 17.9 % — ABNORMAL HIGH (ref 11.5–14.5)
WBC: 0.4 10*3/uL — CL (ref 3.8–10.6)

## 2017-08-18 LAB — FOLATE: Folate: 9.8 ng/mL (ref >5.9)

## 2017-08-18 LAB — FERRITIN: FERRITIN: 1421 ng/mL — AB (ref 24–336)

## 2017-08-18 LAB — TSH: TSH: 2.101 u[IU]/mL (ref 0.350–4.500)

## 2017-08-18 LAB — BPAM PLATELET PHERESIS
Blood Product Expiration Date: 201902182359
ISSUE DATE / TIME: 201902170525
UNIT TYPE AND RH: 600

## 2017-08-18 LAB — PROCALCITONIN: PROCALCITONIN: 10.3 ng/mL

## 2017-08-18 MED ORDER — ACETAMINOPHEN 325 MG PO TABS
ORAL_TABLET | ORAL | Status: AC
  Administered 2017-08-18: 650 mg via ORAL
  Filled 2017-08-18: qty 2

## 2017-08-18 MED ORDER — POLYETHYLENE GLYCOL 3350 17 G PO PACK
17.0000 g | PACK | Freq: Every day | ORAL | Status: DC
  Administered 2017-08-18 – 2017-08-21 (×4): 17 g via ORAL
  Filled 2017-08-18 (×4): qty 1

## 2017-08-18 MED ORDER — SENNOSIDES-DOCUSATE SODIUM 8.6-50 MG PO TABS
1.0000 | ORAL_TABLET | Freq: Two times a day (BID) | ORAL | Status: DC
  Administered 2017-08-18 – 2017-08-21 (×7): 1 via ORAL
  Filled 2017-08-18 (×7): qty 1

## 2017-08-18 MED ORDER — HYDROCORTISONE 2.5 % RE CREA
TOPICAL_CREAM | Freq: Two times a day (BID) | RECTAL | Status: DC
  Administered 2017-08-18 – 2017-08-21 (×7): via RECTAL
  Filled 2017-08-18: qty 28.35

## 2017-08-18 MED ORDER — ACETAMINOPHEN 325 MG PO TABS
650.0000 mg | ORAL_TABLET | Freq: Four times a day (QID) | ORAL | Status: DC | PRN
  Administered 2017-08-18: 650 mg via ORAL

## 2017-08-18 MED ORDER — POTASSIUM CHLORIDE CRYS ER 20 MEQ PO TBCR
30.0000 meq | EXTENDED_RELEASE_TABLET | Freq: Two times a day (BID) | ORAL | Status: AC
  Administered 2017-08-18 (×2): 30 meq via ORAL
  Filled 2017-08-18 (×2): qty 2

## 2017-08-18 NOTE — Progress Notes (Signed)
Richard [name]   DOB:09/17/1940   KD#:[id number]    Subjective: Patient resting in the bed.  No acute distress.  Awaiting to have his breakfast this morning.  Complains of intermittent constipation.  Also complains of some blood in stools.  No bright red blood.  Appetite is fair.  No fevers or chills.  Overall he feels improved since come to the hospital.  Review of system: No nausea vomiting.  No fevers.  Objective:  Vitals:   08/18/17 1000 08/18/17 1100  BP: 94/62 103/65  Pulse: 71 71  Resp: 16 19  Temp:    SpO2: 93% 96%     Intake/Output Summary (Last 24 hours) at 08/18/2017 1250 Last data filed at 08/18/2017 0715 Gross per 24 hour  Intake 400 ml  Output 1160 ml  Net -760 ml    GENERAL Alert, no distress and comfortable. Alone.  EYES: no pallor or icterus OROPHARYNX: no thrush or ulceration. NECK: supple, no masses felt LYMPH:  no palpable lymphadenopathy in the cervical, axillary or inguinal regions LUNGS: decreased breath sounds to auscultation at bases and  No wheeze or crackles HEART/CVS: regular rate & rhythm and no murmurs; No lower extremity edema ABDOMEN: abdomen soft, tender  on deep palpation. and normal bowel sounds Musculoskeletal:no cyanosis of digits and no clubbing  PSYCH: alert & oriented x 3 with fluent speech NEURO: no focal motor/sensory deficits SKIN:  no rashes or significant lesions   Labs:  Lab Results  Component Value Date   WBC 0.4 (LL) 08/18/2017   HGB 8.3 (L) 08/18/2017   HCT 25.3 (L) 08/18/2017   MCV 87.3 08/18/2017   PLT 31 (L) 08/18/2017   NEUTROABS 0.2 (L) 08/18/2017    Lab Results  Component Value Date   NA 135 08/18/2017   K 3.2 (L) 08/18/2017   CL 104 08/18/2017   CO2 22 08/18/2017    Studies:  Dg Chest Port 1 View  Result Date: 08/17/2017 CLINICAL DATA:  Acute respiratory failure. EXAM: PORTABLE CHEST 1 VIEW COMPARISON:  08/16/2017 FINDINGS: Right subclavian Port-A-Cath unchanged with tip over the SVC just above the  cavoatrial junction. Lungs are adequately inflated without focal consolidation or effusion. Remainder of the exam is unchanged. IMPRESSION: No acute cardiopulmonary disease. Right subclavian Port-A-Cath unchanged. Electronically Signed   By: Marin Olp M.D.   On: 08/17/2017 08:38   Dg Chest Portable 1 View  Result Date: 08/16/2017 CLINICAL DATA:  Diarrhea, shortness of breath for 2 days. EXAM: PORTABLE CHEST 1 VIEW COMPARISON:  07/23/2017 FINDINGS: The right chest wall port a catheter is noted with tip at the cavoatrial junction. Previous median sternotomy and CABG procedure. Aortic atherosclerosis noted. Stable moderate cardiac enlargement. No pleural effusion or edema. No airspace opacities identified. IMPRESSION: 1. Cardiac enlargement.  No acute findings. 2.  Aortic Atherosclerosis (ICD10-I70.0). Electronically Signed   By: Kerby Moors M.D.   On: 08/16/2017 19:20   Ct Renal Stone Study  Result Date: 08/16/2017 CLINICAL DATA:  Diarrhea and dyspnea x2 days. EXAM: CT ABDOMEN AND PELVIS WITHOUT CONTRAST TECHNIQUE: Multidetector CT imaging of the abdomen and pelvis was performed following the standard protocol without IV contrast. COMPARISON:  None. FINDINGS: Lower chest: Low-density appearance of the blood pool within the cardiac chambers with visualization of the interventricular septum compatible with anemia. Bibasilar dependent atelectasis. Small hiatal hernia. Hepatobiliary: No focal liver abnormality is seen. No gallstones, gallbladder wall thickening, or biliary dilatation. Pancreas: Unremarkable. No pancreatic ductal dilatation or surrounding inflammatory changes. Spleen: Normal in size  without focal abnormality. Adrenals/Urinary Tract: Normal bilateral adrenal glands. No nephrolithiasis nor obstructive uropathy. Physiologic distention of the urinary bladder without focal mural thickening or calculus. Several small bladder diverticula are noted along the dome and posterior wall. Stomach/Bowel:  Stomach is within normal limits. Appendix is surgically absent by report. No evidence of bowel wall thickening, distention, or inflammatory changes. Vascular/Lymphatic: Moderate aortoiliac atherosclerosis without aneurysm. Atherosclerotic calcifications at the origins of both renal arteries without significant renal cortical scarring. Mild ectasia of the right common iliac artery to 2.2 cm. No pelvic sidewall, inguinal nor abdominal adenopathy. Reproductive: Mild enlargement of the prostate impressing upon the base of the bladder measuring up to 5.9 cm craniocaudad. Normal seminal vesicles. Other: No free air nor free fluid. Musculoskeletal: No acute nor suspicious osseous abnormalities. IMPRESSION: 1. Visualization of the interventricular septum with hypodense appearance of the blood pool compatible with anemia. 2. Bladder diverticulosis. 3. No acute bowel inflammation or inflammation. 4. Moderate aortoiliac and branch vessel atherosclerosis. Electronically Signed   By: Ashley Royalty M.D.   On: 08/16/2017 20:24    Assessment & Plan:   #77 year old male patient with diffuse large B-cell lymphoma currently status post 8 cycles of R CHOP chemotherapy-admit to the hospital for neutropenia/generalized weakness  # Generalized weakness/neutropenia-infection versus others.?  Patient received Neulasta.  White count is 0.4;  I expect patient's counts to improve over the next few days.  No obvious source of infection noted.  Continue antibiotics.  # Pancytopenia-secondary to chemotherapy; platelets 31.  No active bleeding at this time [see discussion below]-hemoglobin stable.  Recommend platelet transfusion if active rectal bleeding noted.  # Rectal bleeding/constipation-likely secondary to hemorrhoidal.  Does not appear lower GI bleed.  Reviewed the GI recommendations.  # Diffuse large B-cell lymphoma status post 8 cycles of R CHOP chemotherapy; last cycle approximately 10 days ago.  Can abdomen pelvis negative  for any ongoing malignancy.  I expect patient to have a good response; await PET scan as planned in March 2019 as per his primary oncologist.   Cammie Sickle, MD 08/18/2017  12:50 PM

## 2017-08-18 NOTE — Progress Notes (Signed)
Complains of constipation and abdominal distention with mild abdominal pain Denies dyspnea No further hematochezia  Vitals:   08/18/17 1300 08/18/17 1400 08/18/17 1500 08/18/17 1600  BP:  105/63 90/74 112/72  Pulse: 99 (!) 101 (!) 104 95  Resp: (!) 22 15 19  (!) 26  Temp:    99.5 F (37.5 C)  TempSrc:    Oral  SpO2: 96% 93% 93% 95%  Weight:      Height:        NAD HEENT WNL JVP not visualized Chest clear anteriorly IR IR, rate controlled, II-III/VI systolic murmur of aortic origin Abdomen moderately distended, tympanitic, tinkling bowel sounds Extremities warm, no edema No focal neurologic deficits  BMP Latest Ref Rng & Units 08/18/2017 08/17/2017 08/16/2017  Glucose 65 - 99 mg/dL 107(H) 142(H) 267(H)  BUN 6 - 20 mg/dL 16 20 23(H)  Creatinine 0.61 - 1.24 mg/dL 1.22 1.33(H) 1.41(H)  BUN/Creat Ratio 10 - 24 - - -  Sodium 135 - 145 mmol/L 135 135 132(L)  Potassium 3.5 - 5.1 mmol/L 3.2(L) 3.5 4.5  Chloride 101 - 111 mmol/L 104 106 101  CO2 22 - 32 mmol/L 22 20(L) 20(L)  Calcium 8.9 - 10.3 mg/dL 7.1(L) 7.3(L) 7.7(L)    CBC Latest Ref Rng & Units 08/18/2017 08/17/2017 08/16/2017  WBC 3.8 - 10.6 K/uL 0.4(LL) 0.3(LL) 0.3(LL)  Hemoglobin 13.0 - 18.0 g/dL 8.3(L) 8.4(L) 7.8(L)  Hematocrit 40.0 - 52.0 % 25.3(L) 26.1(L) 24.5(L)  Platelets 150 - 440 K/uL 31(L) 38(L) 32(L)    CXR 2/17: NACPD  Echocardiogram 02/18:  - Left ventricle: The cavity size was mildly dilated. There was   mild concentric hypertrophy. Systolic function was mildly   reduced. The estimated ejection fraction was in the range of 45%   to 50%. Features are consistent with a pseudonormal left   ventricular filling pattern, with concomitant abnormal relaxation   and increased filling pressure (grade 2 diastolic dysfunction). - Aortic valve: There was severe stenosis. Mean gradient (S): 54 mm   Hg. Valve area (VTI): 0.76 cm^2. - Mitral valve: Mildly calcified annulus. There was mild   regurgitation. - Left atrium:  The atrium was moderately dilated. - Right atrium: The atrium was mildly dilated. - Pulmonary arteries: Systolic pressure could not be accurately   estimated.  Impressions:  - Compared to most recent echo, the EF seems mildly reduced. Degree   of aortic stenosis seems to be stable.  IMP: Severe AS B cell lymphoma Admitted with LGIB - likely hemorrhoidal. Appears resolved Pancytopenia due to recent chemotherapy Neutropenic fever Hypoxemic respiratory failure - improving Likely mild pulmonary edema, clinically improved AKI, improving Severe constipation OSA   PLAN/REC: Continue empiric pip-tazo until WBC counts recover Cont supplemental oxygen as needed to maintain SPO2 90-95% Nocturnal CPAP -may use his own device from home Bowel regimen initiated Recheck CXR in a.m. Further cardiac evaluation and management per cardiology Transfer to [place] with cardiac monitoring  Merton Border, MD PCCM service Mobile [phone] Pager [phone] 08/18/2017 4:38 PM

## 2017-08-18 NOTE — Progress Notes (Signed)
Progress Note  Patient Name: Richard [name] Date of Encounter: 08/18/2017  Primary Cardiologist: Hochrein  Subjective   No chest pain. Troponin has trended to 0.63 currently. Echo pending. HGB stable. Remains in rate-controlled atrial flutter with heart rates in the 70s bpm. Off BiPAP this morning. K+ 3.2 this morning.   Inpatient Medications    Scheduled Meds: . acetaminophen  650 mg Oral Once  . allopurinol  450 mg Oral Daily  . atorvastatin  80 mg Oral QHS  . chlorhexidine  15 mL Mouth Rinse BID  . diphenhydrAMINE  25 mg Oral Once  . ezetimibe  10 mg Oral Daily  . fluticasone  1 spray Each Nare Daily  . glimepiride  2 mg Oral Q breakfast  . insulin aspart  0-20 Units Subcutaneous TID WC  . insulin aspart  0-5 Units Subcutaneous QHS  . lipase/protease/amylase  72,000 Units Oral TID WC  . mouth rinse  15 mL Mouth Rinse q12n4p  . pantoprazole  40 mg Oral Daily  . polycarbophil  625 mg Oral Daily  . pregabalin  75 mg Oral TID  . valACYclovir  1,000 mg Oral Daily   Continuous Infusions: . sodium chloride Stopped (08/17/17 0942)  . piperacillin-tazobactam (ZOSYN)  IV Stopped (08/18/17 0715)  . vancomycin Stopped (08/17/17 2232)   PRN Meds: acetaminophen, diphenoxylate-atropine, furosemide, HYDROcodone-acetaminophen, lidocaine-prilocaine, LORazepam, nitroGLYCERIN, ondansetron, prochlorperazine, promethazine, witch hazel-glycerin   Vital Signs    Vitals:   08/18/17 0500 08/18/17 0600 08/18/17 0700 08/18/17 0800  BP: (!) 97/56 (!) 99/58 (!) 102/57 108/70  Pulse: 71 70 71 74  Resp: 15 14 15  (!) 22  Temp:  98.5 F (36.9 C)  98.2 F (36.8 C)  TempSrc:  Oral  Oral  SpO2: 94% 95% 94% 96%  Weight:      Height:        Intake/Output Summary (Last 24 hours) at 08/18/2017 0910 Last data filed at 08/18/2017 0600 Gross per 24 hour  Intake 350 ml  Output 1160 ml  Net -810 ml   Filed Weights   08/16/17 1847 08/17/17 0833  Weight: 248 lb (112.5 kg) 245 lb 2.4 oz (111.2  kg)    Telemetry    Atrial flutter, 70s bpm, rare PVC - Personally Reviewed  ECG    n/a - Personally Reviewed  Physical Exam   GEN: No acute distress.   Neck: No JVD. Cardiac: Irregular, IV/VI harsh holosystolic murmur RUSB, no rubs, or gallops.  Respiratory: Clear to auscultation bilaterally.  GI: Soft, nontender, non-distended.   MS: Trace bilateral pedal edema; No deformity. Neuro:  Alert and oriented x 3; Nonfocal.  Psych: Normal affect.  Labs    Chemistry Recent Labs  Lab 08/16/17 1829 08/17/17 1037 08/18/17 0308  NA 132* 135 135  K 4.5 3.5 3.2*  CL 101 106 104  CO2 20* 20* 22  GLUCOSE 267* 142* 107*  BUN 23* 20 16  CREATININE 1.41* 1.33* 1.22  CALCIUM 7.7* 7.3* 7.1*  PROT 6.0* 5.7*  --   ALBUMIN 3.5 3.1*  --   AST 26 28  --   ALT 12* 16*  --   ALKPHOS 66 55  --   BILITOT 1.1 1.2  --   GFRNONAA 46* 49* 55*  GFRAA 53* 57* >60  ANIONGAP 11 9 9      Hematology Recent Labs  Lab 08/16/17 1829 08/17/17 1037 08/18/17 0308  WBC 0.3* 0.3* 0.4*  RBC 2.80* 2.99* 2.90*  HGB 7.8* 8.4* 8.3*  HCT  24.5* 26.1* 25.3*  MCV 87.3 87.6 87.3  MCH 27.8 28.2 28.7  MCHC 31.8* 32.2 32.9  RDW 19.8* 18.8* 17.9*  PLT 32* 38* 31*    Cardiac Enzymes Recent Labs  Lab 08/16/17 1829 08/17/17 0030 08/17/17 1037  TROPONINI 0.37* 0.45* 0.63*   No results for input(s): TROPIPOC in the last 168 hours.   BNPNo results for input(s): BNP, PROBNP in the last 168 hours.   DDimer No results for input(s): DDIMER in the last 168 hours.   Radiology    Dg Chest Port 1 View  Result Date: 08/17/2017 IMPRESSION: No acute cardiopulmonary disease. Right subclavian Port-A-Cath unchanged. Electronically Signed   By: Marin Olp M.D.   On: 08/17/2017 08:38   Dg Chest Portable 1 View  Result Date: 08/16/2017 IMPRESSION: 1. Cardiac enlargement.  No acute findings. 2.  Aortic Atherosclerosis (ICD10-I70.0). Electronically Signed   By: Kerby Moors M.D.   On: 08/16/2017 19:20   Ct  Renal Stone Study  Result Date: 08/16/2017 IMPRESSION: 1. Visualization of the interventricular septum with hypodense appearance of the blood pool compatible with anemia. 2. Bladder diverticulosis. 3. No acute bowel inflammation or inflammation. 4. Moderate aortoiliac and branch vessel atherosclerosis. Electronically Signed   By: Ashley Royalty M.D.   On: 08/16/2017 20:24    Cardiac Studies   TTE pending  Patient Profile     77 y.o. male with history of CAD s/p CABG in 2001, B cell lymphoma, severe aortic stenosis, DM2, HTN, HLD, carotid artery disease, and OSA on nighttime oxygen who presented to [facility] with diarrhea.  Assessment & Plan    1. Elevated troponin: -Mildly elevated with a current peak of 0.63, continue to cycle until trends down -Echo pending -No chest pain -Not on heparin gtt given GI bleed and anemia -Recent [facility] 07/28/2017 that showed severe 3-vessel CAD with moderate distal left main stenosis, CTO of the LAD, CTOP of the ramus with patent LIMA-LAD, RIMA-PDA,a nd sequential SVG-ramus and OM1. CTO of the SVG-diagonal with collaterals by the apical LAD -No plans for further inpatient ischemic evaluation at this time unless there is dynamic troponin elevation    2. Severe aortic stenosis: -Echo pending per IM -Has previously been scheduled for TAVR on 09/09/17 -Will need to notify structural heart program of his admission/comorbid conditions   3. New onset atrial flutter: -Rate controlled, not on rate-limiting medications -CHADS2VASc at least 5 (HTN, age x 2, DM, vascular disease) -Not a candidate for full dose anticoagulation at this time given GI bleed and anemia -Continue rate-control strategy -Relative hypotension may preclude addition of BB/CCB -Check TSH -Replete potassium as below  4. Pancytopenia: -Possibly in the setting of his chemotherapy  -Oncology on board  5. Possible GI bleed: -GI on board  6. Acute hypoxic respiratory failure: -Likely  multifactorial including neutropenic sepsis, pancytopenia, severe aortic stenosis, and underlying CAD -Off BiPAP  7. Hypokalemia: -Recommend repletion to goal > 4.0 -Magnesium at goal  For questions or updates, please contact [place] Please consult [email or web address] for contact info under Cardiology/STEMI.    Signed, Christell Faith, PA-C [place] Pager: [phone] 08/18/2017, 9:10 AM

## 2017-08-18 NOTE — Progress Notes (Signed)
Patient with stable blood pressure during shift, oxygen able to be titrated down to 3 L nasal cannula, no reported shortness of breath.  Patient's hemorrhoid pain relieved by Tucks pads, prn pain medication, and ointment Dr. Alva Garnet added to orders. Patient up to [facility] several times, one time small BM, rest of the time patient just passing gas, stool softeners and laxatives given. Patient does report abdomen feeling better but that he still feels constipated.

## 2017-08-18 NOTE — Progress Notes (Signed)
Pharmacy Antibiotic Note  Richard [name] is a 77 y.o. male admitted on 08/16/2017 with sepsis.  Pharmacy has been consulted for zosyn dosing. Vancomycin discontinued 2/18.   Plan: Continue Piperacillin/tazobactam 3.375 g IV q8h EI  Height: 6' (182.9 cm) Weight: 245 lb 2.4 oz (111.2 kg) IBW/kg (Calculated) : 77.6  Temp (24hrs), Avg:98.7 F (37.1 C), Min:96.7 F (35.9 C), Max:100.5 F (38.1 C)  Recent Labs  Lab 08/16/17 1829 08/16/17 2014 08/17/17 1037 08/18/17 0308  WBC 0.3*  --  0.3* 0.4*  CREATININE 1.41*  --  1.33* 1.22  LATICACIDVEN 4.1* 3.1*  --   --     Estimated Creatinine Clearance: 63.2 mL/min (by C-G formula based on SCr of 1.22 mg/dL).    Allergies  Allergen Reactions  . Benazepril Swelling    angioedema; he is not a candidate for any angiotensin receptor blockers because of this significant allergic reaction. Because of a history of documented adverse serious drug reaction;Medi Alert bracelet  is recommended  . Hctz [Hydrochlorothiazide] Anaphylaxis and Swelling    Tongue and lip swelling   . Aspirin Other (See Comments)    Gastritis, cant take 325 Mg aspirin   . Lactose Intolerance (Gi) Nausea And Vomiting    Thank you for allowing pharmacy to be a part of this patient's care.  Pernell Dupre, PharmD, BCPS Clinical Pharmacist 08/18/2017

## 2017-08-18 NOTE — Progress Notes (Signed)
*  PRELIMINARY RESULTS* Echocardiogram 2D Echocardiogram has been performed.  Richard [name] 08/18/2017, 12:35 PM

## 2017-08-18 NOTE — Progress Notes (Signed)
Following up on this patient with anemia and painful hemorrhoids. The patient has had neutropenia and pancytopenia.  He is not a candidate for any endoscopic procedures at this time.  His hemoglobin is stable and no need for any GI intervention at this time.  I discussed this with the family and the patient.  I will sign off.  Please call if any further GI concerns or questions.  We would like to thank you for the opportunity to participate in the care of Richard [name].

## 2017-08-18 NOTE — Progress Notes (Signed)
[place] [facility] [place] NAME: Richard [name]    MR#:  [id number]  DATE OF BIRTH:  03-18-1941  SUBJECTIVE:  CHIEF COMPLAINT:   Chief Complaint  Patient presents with  . Diarrhea    Came with fever, weakness, some blood in the stool.  Noted to be anemic, has recent chemotherapy and so his white blood cell count and platelets are also running low. Also has significant cardiac issues with valvular insufficiencies and plan is for valvular surgery recently. After IV fluids and some transfusion he was hypoxic and pulmonary edema so transferred to ICU and received some Lasix, and is much better this morning. Hemoglobin is stable. WBCs count and platelets are still low.  REVIEW OF SYSTEMS:  CONSTITUTIONAL: No fever, positive for fatigue or weakness.  EYES: No blurred or double vision.  EARS, NOSE, AND THROAT: No tinnitus or ear pain.  RESPIRATORY: No cough, shortness of breath, wheezing or hemoptysis.  CARDIOVASCULAR: No chest pain, orthopnea, edema.  GASTROINTESTINAL: No nausea, vomiting, diarrhea or abdominal pain. Blood in stool. GENITOURINARY: No dysuria, hematuria.  ENDOCRINE: No polyuria, nocturia,  HEMATOLOGY: No anemia, easy bruising or bleeding SKIN: No rash or lesion. MUSCULOSKELETAL: No joint pain or arthritis.   NEUROLOGIC: No tingling, numbness, weakness.  PSYCHIATRY: No anxiety or depression.   ROS  DRUG ALLERGIES:   Allergies  Allergen Reactions  . Benazepril Swelling    angioedema; he is not a candidate for any angiotensin receptor blockers because of this significant allergic reaction. Because of a history of documented adverse serious drug reaction;Medi Alert bracelet  is recommended  . Hctz [Hydrochlorothiazide] Anaphylaxis and Swelling    Tongue and lip swelling   . Aspirin Other (See Comments)    Gastritis, cant take 325 Mg aspirin   . Lactose Intolerance (Gi) Nausea And Vomiting    VITALS:  Blood pressure 112/72, pulse  95, temperature 99.5 F (37.5 C), temperature source Oral, resp. rate (!) 26, height 6' (1.829 m), weight 111.2 kg (245 lb 2.4 oz), SpO2 95 %.  PHYSICAL EXAMINATION:   GENERAL:  77 y.o.-year-old patient lying in the bed with no acute distress.  Morbid obesity EYES: Pupils equal, round, reactive to light and accommodation. No scleral icterus. Extraocular muscles intact.  HEENT: Head atraumatic, normocephalic. Oropharynx and nasopharynx clear.  Dry mucous membranes NECK:  Supple, no jugular venous distention. No thyroid enlargement, no tenderness.  Poor skin turgor LUNGS: Normal breath sounds bilaterally, no wheezing, some crepitation. No use of accessory muscles of respiration.  CARDIOVASCULAR: S1, S2 normal. No murmurs, rubs, or gallops.  ABDOMEN: Soft, nontender, nondistended. Bowel sounds present. No organomegaly or mass.  Nonthrombosed external hemorrhoid, tenderness to palpation EXTREMITIES: No pedal edema, cyanosis, or clubbing.  NEUROLOGIC: Cranial nerves II through XII are intact. Muscle strength 4-5/5 in all extremities. Sensation intact. Gait not checked.  PSYCHIATRIC: The patient is alert and oriented x 3.  SKIN: No obvious rash, lesion, or ulcer.    Physical Exam LABORATORY PANEL:   CBC Recent Labs  Lab 08/18/17 0308  WBC 0.4*  HGB 8.3*  HCT 25.3*  PLT 31*   ------------------------------------------------------------------------------------------------------------------  Chemistries  Recent Labs  Lab 08/17/17 1037 08/18/17 0308  NA 135 135  K 3.5 3.2*  CL 106 104  CO2 20* 22  GLUCOSE 142* 107*  BUN 20 16  CREATININE 1.33* 1.22  CALCIUM 7.3* 7.1*  MG 2.0  --   AST 28  --   ALT 16*  --  ALKPHOS 55  --   BILITOT 1.2  --    ------------------------------------------------------------------------------------------------------------------  Cardiac Enzymes Recent Labs  Lab 08/17/17 0030 08/17/17 1037  TROPONINI 0.45* 0.63*    ------------------------------------------------------------------------------------------------------------------  RADIOLOGY:  Dg Chest Port 1 View  Result Date: 08/17/2017 CLINICAL DATA:  Acute respiratory failure. EXAM: PORTABLE CHEST 1 VIEW COMPARISON:  08/16/2017 FINDINGS: Right subclavian Port-A-Cath unchanged with tip over the SVC just above the cavoatrial junction. Lungs are adequately inflated without focal consolidation or effusion. Remainder of the exam is unchanged. IMPRESSION: No acute cardiopulmonary disease. Right subclavian Port-A-Cath unchanged. Electronically Signed   By: Marin Olp M.D.   On: 08/17/2017 08:38   Dg Chest Portable 1 View  Result Date: 08/16/2017 CLINICAL DATA:  Diarrhea, shortness of breath for 2 days. EXAM: PORTABLE CHEST 1 VIEW COMPARISON:  07/23/2017 FINDINGS: The right chest wall port a catheter is noted with tip at the cavoatrial junction. Previous median sternotomy and CABG procedure. Aortic atherosclerosis noted. Stable moderate cardiac enlargement. No pleural effusion or edema. No airspace opacities identified. IMPRESSION: 1. Cardiac enlargement.  No acute findings. 2.  Aortic Atherosclerosis (ICD10-I70.0). Electronically Signed   By: Kerby Moors M.D.   On: 08/16/2017 19:20   Ct Renal Stone Study  Result Date: 08/16/2017 CLINICAL DATA:  Diarrhea and dyspnea x2 days. EXAM: CT ABDOMEN AND PELVIS WITHOUT CONTRAST TECHNIQUE: Multidetector CT imaging of the abdomen and pelvis was performed following the standard protocol without IV contrast. COMPARISON:  None. FINDINGS: Lower chest: Low-density appearance of the blood pool within the cardiac chambers with visualization of the interventricular septum compatible with anemia. Bibasilar dependent atelectasis. Small hiatal hernia. Hepatobiliary: No focal liver abnormality is seen. No gallstones, gallbladder wall thickening, or biliary dilatation. Pancreas: Unremarkable. No pancreatic ductal dilatation or  surrounding inflammatory changes. Spleen: Normal in size without focal abnormality. Adrenals/Urinary Tract: Normal bilateral adrenal glands. No nephrolithiasis nor obstructive uropathy. Physiologic distention of the urinary bladder without focal mural thickening or calculus. Several small bladder diverticula are noted along the dome and posterior wall. Stomach/Bowel: Stomach is within normal limits. Appendix is surgically absent by report. No evidence of bowel wall thickening, distention, or inflammatory changes. Vascular/Lymphatic: Moderate aortoiliac atherosclerosis without aneurysm. Atherosclerotic calcifications at the origins of both renal arteries without significant renal cortical scarring. Mild ectasia of the right common iliac artery to 2.2 cm. No pelvic sidewall, inguinal nor abdominal adenopathy. Reproductive: Mild enlargement of the prostate impressing upon the base of the bladder measuring up to 5.9 cm craniocaudad. Normal seminal vesicles. Other: No free air nor free fluid. Musculoskeletal: No acute nor suspicious osseous abnormalities. IMPRESSION: 1. Visualization of the interventricular septum with hypodense appearance of the blood pool compatible with anemia. 2. Bladder diverticulosis. 3. No acute bowel inflammation or inflammation. 4. Moderate aortoiliac and branch vessel atherosclerosis. Electronically Signed   By: Ashley Royalty M.D.   On: 08/16/2017 20:24    ASSESSMENT AND PLAN:   Active Problems:   GIB (gastrointestinal bleeding)   Acute hypoxemic respiratory failure (HCC)   Demand ischemia of myocardium (HCC)   Typical atrial flutter (HCC)   Acute on chronic diastolic heart failure (HCC)   1 acute on chronic symptomatic worsening anemia/pancytopenia Secondary to acute on subacute GI bleeding, exacerbated by chemotherapy for lymphoma Suspect due to hemorrhoidal disease   PPI daily, clear liquid diet for now, transfuse 2 units packed red blood cells, 3 pack of platelets, CBC daily,  consult oncology and GI  strict I&O monitoring, neutropenic precautions, and continue close medical monitoring  GI and oncology suggested currently to monitor because of multiple medical issues.   Stool softner.  2 acute BRBPR/GIB Plan of care as stated above  3 acute elevated troponins/possible due to stress   avoid anticoagulants given acute blood loss anemia, antihypertensives on hold given relative hypotension, nitrates as needed, supplemental oxygen as needed, IV morphine as needed  Heart catheterization from January 2019 : -Severe native three-vessel coronary artery disease with moderate distal left main stenosis, total occlusion of the LAD, total occlusion of the ramus intermedius, total occlusion of the first OM, and total occlusion of the RCA. -Status post aortocoronary bypass surgery with continued patency of the RIMA to PDA, LIMA to LAD, and sequential saphenous vein graft to ramus intermedius and first OM branches of the circumflex. 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 Advised to follow as planned outpatient for valvular surgery.  4  sepsis Continue sepsis protocol, empiric vancomycin/Zosyn with pharmacy to dose, follow-up on cultures Could also be related to neutropenic fever in setting of symptomatic worsening pancytopenia/anemia-plan of care as stated above.   Will let Oncology comment on Need for Abx.  5 acute kidney injury Baseline renal function normal Exacerbated by acute blood loss anemia Avoid nephrotoxic agents, strict I&O monitoring, daily weights, BMP daily, hold Lasix given acute dehydrated state  6 acute dehydration Exacerbated by acute blood loss anemia and dehydration IV  fluids for rehydration and blood transfusion as stated above  7 morbid obesity Most likely secondary to excess calories Lifestyle modification recommended  8 chronic obstructive sleep apnea Stable CPAP at bedtime  9 chronic diabetes mellitus type 2 Hold metformin, on glimeperide, sliding scale insulin with Accu-Cheks per routine  10 acute symptomatic hemorrhoidal disease, nonthrombosed Anusol twice daily for 5-day course   All the records are reviewed and case discussed with Care Management/Social Workerr. Management plans discussed with the patient, family and they are in agreement.  CODE STATUS: Full.  TOTAL TIME TAKING CARE OF THIS PATIENT: 35 minutes.   Discussed with his daughter in the room and with ICU physician and cardiologist.  POSSIBLE D/C IN 1-2 DAYS, DEPENDING ON CLINICAL CONDITION.   Vaughan Basta M.D on 08/18/2017   Between 7am to 6pm - Pager - [phone]  After 6pm go to [email or web address] - password [email or web address] [place] [facility]  Office  [phone]  CC: Primary care physician; [name], DO  Note: This dictation was prepared with Dragon dictation along with smaller phrase technology. Any transcriptional errors that result from this process are unintentional.

## 2017-08-19 ENCOUNTER — Inpatient Hospital Stay: Payer: Medicare Other

## 2017-08-19 DIAGNOSIS — I4892 Unspecified atrial flutter: Secondary | ICD-10-CM

## 2017-08-19 DIAGNOSIS — K5900 Constipation, unspecified: Secondary | ICD-10-CM

## 2017-08-19 LAB — GLUCOSE, CAPILLARY
GLUCOSE-CAPILLARY: 120 mg/dL — AB (ref 65–99)
Glucose-Capillary: 148 mg/dL — ABNORMAL HIGH (ref 65–99)
Glucose-Capillary: 101 mg/dL — ABNORMAL HIGH (ref 65–99)
Glucose-Capillary: 153 mg/dL — ABNORMAL HIGH (ref 65–99)
Glucose-Capillary: 143 mg/dL — ABNORMAL HIGH (ref 65–99)

## 2017-08-19 LAB — CBC WITH DIFFERENTIAL/PLATELET
BASOS PCT: 1 %
Basophils Absolute: 0 10*3/uL (ref 0–0.1)
EOS PCT: 1 %
Eosinophils Absolute: 0 10*3/uL (ref 0–0.7)
HCT: 25.3 % — ABNORMAL LOW (ref 40.0–52.0)
Hemoglobin: 8.2 g/dL — ABNORMAL LOW (ref 13.0–18.0)
Lymphocytes Relative: 11 %
Lymphs Abs: 0.2 10*3/uL — ABNORMAL LOW (ref 1.0–3.6)
MCH: 28.2 pg (ref 26.0–34.0)
MCHC: 32.6 g/dL (ref 32.0–36.0)
MCV: 86.6 fL (ref 80.0–100.0)
MONO ABS: 0.1 10*3/uL — AB (ref 0.2–1.0)
Monocytes Relative: 9 %
NEUTROS PCT: 78 %
Neutro Abs: 1.1 10*3/uL — ABNORMAL LOW (ref 1.4–6.5)
Platelets: 30 10*3/uL — ABNORMAL LOW (ref 150–440)
RBC: 2.91 MIL/uL — ABNORMAL LOW (ref 4.40–5.90)
RDW: 18.6 % — AB (ref 11.5–14.5)
WBC MORPHOLOGY: INCREASED
WBC: 1.4 10*3/uL — CL (ref 3.8–10.6)

## 2017-08-19 LAB — COMPREHENSIVE METABOLIC PANEL
ALT: 44 U/L (ref 17–63)
AST: 45 U/L — AB (ref 15–41)
Albumin: 3.1 g/dL — ABNORMAL LOW (ref 3.5–5.0)
Alkaline Phosphatase: 167 U/L — ABNORMAL HIGH (ref 38–126)
Anion gap: 12 (ref 5–15)
BUN: 14 mg/dL (ref 6–20)
CO2: 19 mmol/L — ABNORMAL LOW (ref 22–32)
CREATININE: 1.1 mg/dL (ref 0.61–1.24)
Calcium: 7.3 mg/dL — ABNORMAL LOW (ref 8.9–10.3)
Chloride: 100 mmol/L — ABNORMAL LOW (ref 101–111)
GFR calc Af Amer: >60 mL/min (ref >60)
Glucose, Bld: 154 mg/dL — ABNORMAL HIGH (ref 65–99)
Potassium: 3.6 mmol/L (ref 3.5–5.1)
Sodium: 131 mmol/L — ABNORMAL LOW (ref 135–145)
TOTAL PROTEIN: 6.2 g/dL — AB (ref 6.5–8.1)
Total Bilirubin: 1.4 mg/dL — ABNORMAL HIGH (ref 0.3–1.2)

## 2017-08-19 MED ORDER — BISACODYL 10 MG RE SUPP
10.0000 mg | Freq: Once | RECTAL | Status: AC
  Administered 2017-08-19: 10 mg via RECTAL
  Filled 2017-08-19: qty 1

## 2017-08-19 NOTE — Progress Notes (Signed)
Patient moved to room 120 as ordered.  Report called to Sarah.  Daughter notified of room change.  Patient left room via wheelchair with all personal belongings.

## 2017-08-19 NOTE — Care Management (Signed)
RNCM reached out to patient's daughter Arrie Aran [phone]. She has requested bedside commode however I explained to her that insurance would not pay for it. She wishes to go to  to find one if she can. She does not want to pay out of pocket for one.

## 2017-08-19 NOTE — Progress Notes (Signed)
Patient is floor status.  Was not seen officially by [facility] service on this day.  Merton Border, MD PCCM service Mobile [phone] Pager [phone] 08/19/2017 3:35 PM

## 2017-08-19 NOTE — Progress Notes (Signed)
Progress Note  Patient Name: Richard [name] Date of Encounter: 08/19/2017  Primary Cardiologist: Hochrein  Subjective   No chest pain or SOB. No palpitations. Notes constipation.   Inpatient Medications    Scheduled Meds: . allopurinol  450 mg Oral Daily  . atorvastatin  80 mg Oral QHS  . chlorhexidine  15 mL Mouth Rinse BID  . ezetimibe  10 mg Oral Daily  . fluticasone  1 spray Each Nare Daily  . glimepiride  2 mg Oral Q breakfast  . hydrocortisone   Rectal BID  . insulin aspart  0-20 Units Subcutaneous TID WC  . insulin aspart  0-5 Units Subcutaneous QHS  . lipase/protease/amylase  72,000 Units Oral TID WC  . mouth rinse  15 mL Mouth Rinse q12n4p  . pantoprazole  40 mg Oral Daily  . polycarbophil  625 mg Oral Daily  . polyethylene glycol  17 g Oral Daily  . pregabalin  75 mg Oral TID  . senna-docusate  1 tablet Oral BID  . valACYclovir  1,000 mg Oral Daily   Continuous Infusions: . sodium chloride Stopped (08/17/17 0942)  . piperacillin-tazobactam (ZOSYN)  IV 3.375 g (08/19/17 0423)   PRN Meds: acetaminophen, diphenoxylate-atropine, HYDROcodone-acetaminophen, lidocaine-prilocaine, LORazepam, nitroGLYCERIN, ondansetron, prochlorperazine, promethazine, witch hazel-glycerin   Vital Signs    Vitals:   08/19/17 0300 08/19/17 0400 08/19/17 0500 08/19/17 0600  BP: 106/64 105/71 102/61 105/71  Pulse: 79 82 86 81  Resp: 19 17 (!) 23 (!) 21  Temp:      TempSrc:      SpO2: 92% 92% 90% 90%  Weight:      Height:        Intake/Output Summary (Last 24 hours) at 08/19/2017 0800 Last data filed at 08/19/2017 3570 Gross per 24 hour  Intake 990 ml  Output 670 ml  Net 320 ml   Filed Weights   08/16/17 1847 08/17/17 0833  Weight: 248 lb (112.5 kg) 245 lb 2.4 oz (111.2 kg)    Telemetry    Afib/flutter with heart rates in the 70s bpm - Personally Reviewed  ECG    n/a - Personally Reviewed  Physical Exam   GEN: No acute distress.   Neck: No JVD. Cardiac: RRR,  IV/VI systolic murmur RUSB, no rubs, or gallops.  Respiratory: Clear to auscultation bilaterally.  GI: Soft, nontender, non-distended.   MS: No edema; No deformity. Neuro:  Alert and oriented x 3; Nonfocal.  Psych: Normal affect.  Labs    Chemistry Recent Labs  Lab 08/16/17 1829 08/17/17 1037 08/18/17 0308 08/19/17 0435  NA 132* 135 135 131*  K 4.5 3.5 3.2* 3.6  CL 101 106 104 100*  CO2 20* 20* 22 19*  GLUCOSE 267* 142* 107* 154*  BUN 23* 20 16 14   CREATININE 1.41* 1.33* 1.22 1.10  CALCIUM 7.7* 7.3* 7.1* 7.3*  PROT 6.0* 5.7*  --  6.2*  ALBUMIN 3.5 3.1*  --  3.1*  AST 26 28  --  45*  ALT 12* 16*  --  44  ALKPHOS 66 55  --  167*  BILITOT 1.1 1.2  --  1.4*  GFRNONAA 46* 49* 55* >60  GFRAA 53* 57* >60 >60  ANIONGAP 11 9 9 12      Hematology Recent Labs  Lab 08/17/17 1037 08/18/17 0308 08/19/17 0435  WBC 0.3* 0.4* 1.4*  RBC 2.99* 2.90* 2.91*  HGB 8.4* 8.3* 8.2*  HCT 26.1* 25.3* 25.3*  MCV 87.6 87.3 86.6  MCH 28.2 28.7  28.2  MCHC 32.2 32.9 32.6  RDW 18.8* 17.9* 18.6*  PLT 38* 31* 30*    Cardiac Enzymes Recent Labs  Lab 08/16/17 1829 08/17/17 0030 08/17/17 1037  TROPONINI 0.37* 0.45* 0.63*   No results for input(s): TROPIPOC in the last 168 hours.   BNPNo results for input(s): BNP, PROBNP in the last 168 hours.   DDimer No results for input(s): DDIMER in the last 168 hours.   Radiology    Dg Chest Port 1 View  Result Date: 08/19/2017 IMPRESSION: No acute findings.  No change. Electronically Signed   By: Rolm Baptise M.D.   On: 08/19/2017 07:14    Cardiac Studies   TTE 08/18/2017: Study Conclusions  - Left ventricle: The cavity size was mildly dilated. There was   mild concentric hypertrophy. Systolic function was mildly   reduced. The estimated ejection fraction was in the range of 45%   to 50%. Features are consistent with a pseudonormal left   ventricular filling pattern, with concomitant abnormal relaxation   and increased filling pressure  (grade 2 diastolic dysfunction). - Aortic valve: There was severe stenosis. Mean gradient (S): 54 mm   Hg. Valve area (VTI): 0.76 cm^2. - Mitral valve: Mildly calcified annulus. There was mild   regurgitation. - Left atrium: The atrium was moderately dilated. - Right atrium: The atrium was mildly dilated. - Pulmonary arteries: Systolic pressure could not be accurately   estimated.  Impressions:  - Compared to most recent echo, the EF seems mildly reduced. Degree   of aortic stenosis seems to be stable.  Patient Profile     77 y.o. male with history of CAD s/p CABG in 2001, B cell lymphoma, severe aortic stenosis, DM2, HTN, HLD, carotid artery disease, and OSA on nighttime oxygen who presented to [facility] with diarrhea.  Assessment & Plan    1. Elevated troponin: -Mildly elevated with a current peak of 0.63, continue to cycle until trends down -Echo as above -No chest pain -Not on heparin gtt given GI bleed and anemia -Recent [facility] 07/28/2017 that showed severe 3-vessel CAD with moderate distal left main stenosis, CTO of the LAD, CTOP of the ramus with patent LIMA-LAD, RIMA-PDA,a nd sequential SVG-ramus and OM1. CTO of the SVG-diagonal with collaterals by the apical LAD -No plans for further inpatient ischemic evaluation at this time unless there is dynamic troponin elevation    2. Severe aortic stenosis: -Echo as above -Has previously been scheduled for TAVR on 09/09/17 -Will need to notify structural heart program of his admission/comorbid conditions   3. New onset atrial flutter: -Rate controlled, not on rate-limiting medications -CHADS2VASc at least 5 (HTN, age x 2, DM, vascular disease) -Not a candidate for full dose anticoagulation at this time given GI bleed and anemia -Continue rate-control strategy -Relative hypotension may preclude addition of BB/CCB -TSH normal -Replete potassium as below  4. Pancytopenia: -Possibly in the setting of his chemotherapy  -Oncology  on board -Counts stable to improving   5. Possible GI bleed: -GI on board  6. Acute hypoxic respiratory failure: -Likely multifactorial including neutropenic sepsis, pancytopenia, severe aortic stenosis, and underlying CAD -Off BiPAP  7. Hypokalemia: -Improving -Magnesium at goal  8. Constipation: -Per IM    For questions or updates, please contact [place] Please consult [email or web address] for contact info under Cardiology/STEMI.    Signed, Christell Faith, PA-C [facility] Pager: [phone] 08/19/2017, 8:00 AM

## 2017-08-19 NOTE — Progress Notes (Signed)
[facility] [place] NAME: Richard [name]    MR#:  [id number]  DATE OF BIRTH:  [birth year]  SUBJECTIVE:  CHIEF COMPLAINT:   Chief Complaint  Patient presents with  . Diarrhea    Came with fever, weakness, some blood in the stool.  Noted to be anemic, has recent chemotherapy and so his white blood cell count and platelets are also running low. Also has significant cardiac issues with valvular insufficiencies and plan is for valvular surgery recently. After IV fluids and some transfusion he was hypoxic and pulmonary edema so transferred to ICU and received some Lasix, and is much better this morning. Hemoglobin is stable. WBCs count improved some, hemoglobin stable, platelets are still low.  REVIEW OF SYSTEMS:  CONSTITUTIONAL: No fever, positive for fatigue or weakness.  EYES: No blurred or double vision.  EARS, NOSE, AND THROAT: No tinnitus or ear pain.  RESPIRATORY: No cough, shortness of breath, wheezing or hemoptysis.  CARDIOVASCULAR: No chest pain, orthopnea, edema.  GASTROINTESTINAL: No nausea, vomiting, diarrhea or abdominal pain. Blood in stool. GENITOURINARY: No dysuria, hematuria.  ENDOCRINE: No polyuria, nocturia,  HEMATOLOGY: No anemia, easy bruising or bleeding SKIN: No rash or lesion. MUSCULOSKELETAL: No joint pain or arthritis.   NEUROLOGIC: No tingling, numbness, weakness.  PSYCHIATRY: No anxiety or depression.   ROS  DRUG ALLERGIES:   Allergies  Allergen Reactions  . Benazepril Swelling    angioedema; he is not a candidate for any angiotensin receptor blockers because of this significant allergic reaction. Because of a history of documented adverse serious drug reaction;Medi Alert bracelet  is recommended  . Hctz [Hydrochlorothiazide] Anaphylaxis and Swelling    Tongue and lip swelling   . Aspirin Other (See Comments)    Gastritis, cant take 325 Mg aspirin   . Lactose Intolerance (Gi) Nausea And Vomiting    VITALS:   Blood pressure 113/67, pulse 80, temperature (!) 97.4 F (36.3 C), temperature source Oral, resp. rate (!) 21, height 6' (1.829 m), weight 111.2 kg (245 lb 2.4 oz), SpO2 96 %.  PHYSICAL EXAMINATION:   GENERAL:  77 y.o.-year-old patient lying in the bed with no acute distress.  Morbid obesity EYES: Pupils equal, round, reactive to light and accommodation. No scleral icterus. Extraocular muscles intact.  HEENT: Head atraumatic, normocephalic. Oropharynx and nasopharynx clear.  Dry mucous membranes NECK:  Supple, no jugular venous distention. No thyroid enlargement, no tenderness.  Poor skin turgor LUNGS: Normal breath sounds bilaterally, no wheezing, some crepitation. No use of accessory muscles of respiration.  CARDIOVASCULAR: S1, S2 normal. No murmurs, rubs, or gallops.  ABDOMEN: Soft, nontender, nondistended. Bowel sounds present. No organomegaly or mass.  Nonthrombosed external hemorrhoid, tenderness to palpation EXTREMITIES: No pedal edema, cyanosis, or clubbing.  NEUROLOGIC: Cranial nerves II through XII are intact. Muscle strength 4-5/5 in all extremities. Sensation intact. Gait not checked.  PSYCHIATRIC: The patient is alert and oriented x 3.  SKIN: No obvious rash, lesion, or ulcer.    Physical Exam LABORATORY PANEL:   CBC Recent Labs  Lab 08/19/17 0435  WBC 1.4*  HGB 8.2*  HCT 25.3*  PLT 30*   ------------------------------------------------------------------------------------------------------------------  Chemistries  Recent Labs  Lab 08/17/17 1037  08/19/17 0435  NA 135   < > 131*  K 3.5   < > 3.6  CL 106   < > 100*  CO2 20*   < > 19*  GLUCOSE 142*   < > 154*  BUN 20   < >  14  CREATININE 1.33*   < > 1.10  CALCIUM 7.3*   < > 7.3*  MG 2.0  --   --   AST 28  --  45*  ALT 16*  --  44  ALKPHOS 55  --  167*  BILITOT 1.2  --  1.4*   < > = values in this interval not displayed.    ------------------------------------------------------------------------------------------------------------------  Cardiac Enzymes Recent Labs  Lab 08/17/17 0030 08/17/17 1037  TROPONINI 0.45* 0.63*   ------------------------------------------------------------------------------------------------------------------  RADIOLOGY:  Dg Chest Port 1 View  Result Date: 08/19/2017 CLINICAL DATA:  Respiratory failure EXAM: PORTABLE CHEST 1 VIEW COMPARISON:  08/17/2017 FINDINGS: Prior CABG. Mild hyperinflation/COPD. Right Port-A-Cath is unchanged. Cardiomegaly. No confluent opacities or effusions. No acute bony abnormality. IMPRESSION: No acute findings.  No change. Electronically Signed   By: Rolm Baptise M.D.   On: 08/19/2017 07:14    ASSESSMENT AND PLAN:   Active Problems:   GIB (gastrointestinal bleeding)   Acute hypoxemic respiratory failure (HCC)   Demand ischemia of myocardium (HCC)   Typical atrial flutter (HCC)   Acute on chronic diastolic heart failure (HCC)   1 acute on chronic symptomatic worsening anemia/pancytopenia Secondary to acute on subacute GI bleeding, exacerbated by chemotherapy for lymphoma Suspect due to hemorrhoidal disease   PPI daily, clear liquid diet for now, transfuse 2 units packed red blood cells, 3 pack of platelets, CBC daily, consult oncology and GI  strict I&O monitoring, neutropenic precautions, and continue close medical monitoring   GI and oncology suggested currently to monitor because of multiple medical issues.   Stool softner.  2 acute BRBPR/GIB- hemorrhoidal bleed. Plan of care as stated above  3 acute elevated troponins/possible due to stress   avoid anticoagulants given acute blood loss anemia, antihypertensives on hold given relative hypotension, nitrates as needed, supplemental oxygen as needed, IV morphine as needed  Heart catheterization from January 2019 : -Severe native three-vessel coronary artery disease with moderate  distal left main stenosis, total occlusion of the LAD, total occlusion of the ramus intermedius, total occlusion of the first OM, and total occlusion of the RCA. -Status post aortocoronary bypass surgery with continued patency of the RIMA to PDA, LIMA to LAD, and sequential saphenous vein graft to ramus intermedius and first OM branches of the circumflex. -Chronic occlusion of the saphenous vein graft to diagonal, the diagonal branch is collateralized by the apical LAD -Severe calcific aortic stenosis with a mean transvalvular gradient of 51 mmHg  Appreciated help by cardiologist,  as mentioned above, continue monitoring currently and optimize the health. Advised to follow as planned outpatient for valvular surgery.  4  sepsis Continue sepsis protocol, empiric vancomycin/Zosyn with pharmacy to dose, follow-up on cultures Could also be related to neutropenic fever in setting of symptomatic worsening pancytopenia/anemia-plan of care as stated above.   Will let Oncology comment on Need for Abx.  5 acute kidney injury Baseline renal function normal Exacerbated by acute blood loss anemia Avoid nephrotoxic agents, strict I&O monitoring, daily weights, BMP daily, hold Lasix given acute dehydrated state   Improving.  6 acute dehydration Exacerbated by acute blood loss anemia and dehydration IV fluids for rehydration and blood transfusion as stated above  7 morbid obesity Most likely secondary to excess calories Lifestyle modification recommended  8 chronic obstructive sleep apnea Stable CPAP at bedtime  9 chronic diabetes mellitus type 2 Hold metformin, on glimeperide, sliding scale insulin with Accu-Cheks per routine  10 acute symptomatic hemorrhoidal disease, nonthrombosed  Anusol twice daily for 5-day course   All the records are reviewed and case discussed with Care Management/Social Workerr. Management plans discussed with the patient, family and they are in  agreement.  CODE STATUS: Full.  TOTAL TIME TAKING CARE OF THIS PATIENT: 35 minutes.   Discussed with his daughter in the room and with ICU physician and cardiologist.  POSSIBLE D/C IN 1-2 DAYS, DEPENDING ON CLINICAL CONDITION. PT eval.  Vaughan Basta M.D on 08/19/2017   Between 7am to 6pm - Pager - [phone]  After 6pm go to [email or web address] - password [email or web address] [facility]  Office  [phone]  CC: Primary care physician; [name], DO  Note: This dictation was prepared with Dragon dictation along with smaller phrase technology. Any transcriptional errors that result from this process are unintentional.

## 2017-08-19 NOTE — Progress Notes (Signed)
Hematology/Oncology Consult note [facility]  Telephone:([phone] [phone]  Patient Care Team: Carollee Herter, Alferd Apa, DO as PCP - General (Family Medicine) Minus Breeding, MD as PCP - Cardiology (Cardiology) Dingeldein, Remo Lipps, MD (Ophthalmology) Rigoberto Noel, MD as Consulting Physician (Pulmonary Disease) [name], MD as Consulting Physician (Oncology) Minus Breeding, MD as Consulting Physician (Cardiology) Parrett, Fonnie Mu, NP as Nurse Practitioner (Pulmonary Disease) [facility] (Dermatology) Latanya Maudlin, MD as Consulting Physician (Orthopedic Surgery)   Name of the patient: Richard [name]  [id number]  1941/06/29   Date of visit: 08/19/2017  Interval history- sob has improved. He is now on Pleak. Still feels constipated. Hemorrhoidal pain well controlled.   ECOG PS- 2 Pain scale- 0   Review of systems- Review of Systems  Constitutional: Positive for malaise/fatigue. Negative for chills, fever and weight loss.  HENT: Negative for congestion, ear discharge and nosebleeds.   Eyes: Negative for blurred vision.  Respiratory: Negative for cough, hemoptysis, sputum production, shortness of breath and wheezing.   Cardiovascular: Negative for chest pain, palpitations, orthopnea and claudication.  Gastrointestinal: Positive for constipation. Negative for abdominal pain, blood in stool, diarrhea, heartburn, melena, nausea and vomiting.  Genitourinary: Negative for dysuria, flank pain, frequency, hematuria and urgency.  Musculoskeletal: Negative for back pain, joint pain and myalgias.  Skin: Negative for rash.  Neurological: Negative for dizziness, tingling, focal weakness, seizures, weakness and headaches.  Endo/Heme/Allergies: Does not bruise/bleed easily.  Psychiatric/Behavioral: Negative for depression and suicidal ideas. The patient does not have insomnia.       Allergies  Allergen Reactions  . Benazepril Swelling      angioedema; he is not a candidate for any angiotensin receptor blockers because of this significant allergic reaction. Because of a history of documented adverse serious drug reaction;Medi Alert bracelet  is recommended  . Hctz [Hydrochlorothiazide] Anaphylaxis and Swelling    Tongue and lip swelling   . Aspirin Other (See Comments)    Gastritis, cant take 325 Mg aspirin   . Lactose Intolerance (Gi) Nausea And Vomiting     Past Medical History:  Diagnosis Date  . Anemia   . Axillary adenopathy 02/25/2017  . Bradycardia    a. holter monitor has demonstrated HRs in 30s and [place]   . CAD (coronary artery disease)    a. s/p CABG 2001  . Chronic lower back pain   . Diffuse large B cell lymphoma ([place])   . Diverticulosis   . Esophageal stricture   . GERD (gastroesophageal reflux disease)   . History of gout   . HTN (hypertension)   . Mixed hyperlipidemia   . OSA on CPAP    with 2L O2 at night  . Pancytopenia ([place])    a. related to chemo therapy for B cell lymphoma  . Peptic stricture of esophagus   . Severe aortic stenosis   . Spinal stenosis   . Type II diabetes mellitus ([place])      Past Surgical History:  Procedure Laterality Date  . APPENDECTOMY  ~ 1952  . BACK SURGERY    . CATARACT EXTRACTION W/PHACO Left 11/06/2016   Procedure: CATARACT EXTRACTION PHACO AND INTRAOCULAR LENS PLACEMENT (IOC);  Surgeon: Estill Cotta, MD;  Location: [facility] ORS;  Service: Ophthalmology;  Laterality: Left;  Lot # [id number] H [place]: 01:09.4 AP%:25.2 CDE: 30.64  . CATARACT EXTRACTION W/PHACO Right 12/04/2016   Procedure: CATARACT EXTRACTION PHACO AND INTRAOCULAR LENS PLACEMENT (IOC);  Surgeon: Estill Cotta, MD;  Location: [facility]  ORS;  Service: Ophthalmology;  Laterality: Right;  [place] 1:25.9 AP% 24.1 CDE 39.10 Fluid Pack lot # [id number]  . COLONOSCOPY W/ BIOPSIES AND POLYPECTOMY  2013  . CORONARY ANGIOPLASTY  1993  . CORONARY ANGIOPLASTY WITH STENT PLACEMENT  05/1997   "1"  . CORONARY  ARTERY BYPASS GRAFT  03/2000   "CABG X5"  . ESOPHAGEAL DILATION  X 3-4   Dr. Lyla Son; "last one was in the 1990's"  . ESOPHAGOGASTRODUODENOSCOPY     multiple  . FLEXIBLE SIGMOIDOSCOPY     multiple  . HYDRADENITIS EXCISION Left 02/25/2017   Procedure: EXCISION DEEP LEFT AXILLARY LYMPH NODE;  Surgeon: Fanny Skates, MD;  Location: [place];  Service: General;  Laterality: Left;  . KNEE ARTHROSCOPY Left 2011   meniscus repair  . LEFT HEART CATHETERIZATION WITH CORONARY/GRAFT ANGIOGRAM N/A 03/16/2014   Procedure: LEFT HEART CATHETERIZATION WITH Beatrix Fetters;  Surgeon: Burnell Blanks, MD;  Location: [facility] CATH LAB;  Service: Cardiovascular;  Laterality: N/A;  . LUMBAR LAMINECTOMY/DECOMPRESSION MICRODISCECTOMY Right 06/17/2013   Procedure: LUMBAR LAMINECTOMY MICRODISCECTOMY L4-L5 RIGHT EXCISION OF SYNOVIAL CYST RIGHT   (1 LEVEL) RIGHT PARTIAL FACETECTOMY;  Surgeon: Tobi Bastos, MD;  Location: [facility] ORS;  Service: Orthopedics;  Laterality: Right;  . MYELOGRAM  04/06/13   lumbar, Dr Gladstone Lighter  . PANENDOSCOPY    . PORTACATH PLACEMENT N/A 03/06/2017   Procedure: INSERTION PORT-A-CATH AND ASPIRATE SEROMA LEFT AXILLA;  Surgeon: Fanny Skates, MD;  Location: [facility] [address];  Service: General;  Laterality: N/A;  . RIGHT/LEFT HEART CATH AND CORONARY/GRAFT ANGIOGRAPHY N/A 07/28/2017   Procedure: RIGHT/LEFT HEART CATH AND CORONARY/GRAFT ANGIOGRAPHY;  Surgeon: Sherren Mocha, MD;  Location: [place] CV LAB;  Service: Cardiovascular;  Laterality: N/A;  . SHOULDER SURGERY Right 08/2010   screws placed; "tendons tore off"  . SKIN CANCER EXCISION Right    "neck"  . TONSILLECTOMY  ~ 1954  . UPPER GI ENDOSCOPY  2013   Gastritis; Dr Carlean Purl  . VASECTOMY      Social History   Socioeconomic History  . Marital status: Divorced    Spouse name: Not on file  . Number of children: 2  . Years of education: Not on file  . Highest education level: Not on file  Social Needs  . Financial resource  strain: Not on file  . Food insecurity - worry: Not on file  . Food insecurity - inability: Not on file  . Transportation needs - medical: Not on file  . Transportation needs - non-medical: Not on file  Occupational History    Employer: RETIRED  Tobacco Use  . Smoking status: Never Smoker  . Smokeless tobacco: Never Used  Substance and Sexual Activity  . Alcohol use: No    Alcohol/week: 0.0 oz    Comment: "last drink was in 2012"( 03/15/2014)  . Drug use: No  . Sexual activity: Not Currently  Other Topics Concern  . Not on file  Social History Narrative   Divorced, lives with a roommate.   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    Family History  Problem Relation Age of Onset  . Stroke Father   . Hypertension Father   . Pancreatic cancer Mother   . Diabetes Maternal Grandmother   . Stroke Maternal Grandmother   . Heart attack Paternal Grandmother   .  Colon cancer Neg Hx   . Esophageal cancer Neg Hx   . Rectal cancer Neg Hx   . Stomach cancer Neg Hx   . Ulcers Neg Hx      Current Facility-Administered Medications:  .  0.9 %  sodium chloride infusion, , Intravenous, Continuous, Salary, Avel Peace, MD, Stopped at 08/17/17 [phone] .  acetaminophen (TYLENOL) tablet 650 mg, 650 mg, Oral, Q6H PRN, Tukov, Magadalene S, NP, 650 mg at 08/18/17 0326 .  allopurinol (ZYLOPRIM) tablet 450 mg, 450 mg, Oral, Daily, Salary, Montell D, MD, 450 mg at 08/18/17 0919 .  atorvastatin (LIPITOR) tablet 80 mg, 80 mg, Oral, QHS, Salary, Montell D, MD, 80 mg at 08/18/17 2139 .  chlorhexidine (PERIDEX) 0.12 % solution 15 mL, 15 mL, Mouth Rinse, BID, Lafayette Dragon, MD, 15 mL at 08/18/17 2140 .  diphenoxylate-atropine (LOMOTIL) 2.5-0.025 MG per tablet 1 tablet, 1 tablet, Oral, QID PRN, Salary, Montell D, MD .  ezetimibe  (ZETIA) tablet 10 mg, 10 mg, Oral, Daily, Salary, Montell D, MD, 10 mg at 08/18/17 0919 .  fluticasone (FLONASE) 50 MCG/ACT nasal spray 1 spray, 1 spray, Each Nare, Daily, Salary, Montell D, MD .  glimepiride (AMARYL) tablet 2 mg, 2 mg, Oral, Q breakfast, Salary, Montell D, MD, 2 mg at 08/17/17 0830 .  HYDROcodone-acetaminophen (NORCO/VICODIN) 5-325 MG per tablet 1-2 tablet, 1-2 tablet, Oral, Q4H PRN, Salary, Montell D, MD, 2 tablet at 08/19/17 0423 .  hydrocortisone (ANUSOL-HC) 2.5 % rectal cream, , Rectal, BID, Simonds, David B, MD .  insulin aspart (novoLOG) injection 0-20 Units, 0-20 Units, Subcutaneous, TID WC, Salary, Montell D, MD, 4 Units at 08/18/17 1719 .  insulin aspart (novoLOG) injection 0-5 Units, 0-5 Units, Subcutaneous, QHS, Salary, Montell D, MD .  lidocaine-prilocaine (EMLA) cream 1 application, 1 application, Topical, PRN, Salary, Montell D, MD .  lipase/protease/amylase (CREON) capsule 72,000 Units, 72,000 Units, Oral, TID WC, Salary, Montell D, MD, 72,000 Units at 08/18/17 1720 .  LORazepam (ATIVAN) tablet 0.5 mg, 0.5 mg, Oral, Q6H PRN, Salary, Montell D, MD .  MEDLINE mouth rinse, 15 mL, Mouth Rinse, q12n4p, Lafayette Dragon, MD .  nitroGLYCERIN (NITROSTAT) SL tablet 0.4 mg, 0.4 mg, Sublingual, Q5 min PRN, Salary, Montell D, MD .  ondansetron (ZOFRAN) tablet 8 mg, 8 mg, Oral, BID PRN, Salary, Montell D, MD .  pantoprazole (PROTONIX) EC tablet 40 mg, 40 mg, Oral, Daily, Salary, Montell D, MD, 40 mg at 08/18/17 0918 .  piperacillin-tazobactam (ZOSYN) IVPB 3.375 g, 3.375 g, Intravenous, Q8H, Amelia Jo, MD, Last Rate: 12.5 mL/hr at 08/19/17 0423, 3.375 g at 08/19/17 0423 .  polycarbophil (FIBERCON) tablet 625 mg, 625 mg, Oral, Daily, Salary, Montell D, MD, 625 mg at 08/18/17 0918 .  polyethylene glycol (MIRALAX / GLYCOLAX) packet 17 g, 17 g, Oral, Daily, Wilhelmina Mcardle, MD, 17 g at 08/18/17 1305 .  pregabalin (LYRICA) capsule 75 mg, 75 mg, Oral, TID, Salary, Montell D, MD,  75 mg at 08/18/17 2139 .  prochlorperazine (COMPAZINE) tablet 10 mg, 10 mg, Oral, Q6H PRN, Salary, Montell D, MD .  promethazine (PHENERGAN) tablet 12.5 mg, 12.5 mg, Oral, Q6H PRN, Salary, Montell D, MD .  senna-docusate (Senokot-S) tablet 1 tablet, 1 tablet, Oral, BID, Wilhelmina Mcardle, MD, 1 tablet at 08/18/17 2139 .  valACYclovir (VALTREX) tablet 1,000 mg, 1,000 mg, Oral, Daily, Salary, Montell D, MD, 1,000 mg at 08/18/17 0918 .  witch hazel-glycerin (TUCKS) pad, , Topical, PRN, Lafayette Dragon, MD  Facility-Administered Medications Ordered  in Other Encounters:  .  sodium chloride flush (NS) 0.9 % injection 10 mL, 10 mL, Intravenous, PRN, [name], MD, 10 mL at 05/30/17 0920  Physical exam:  Vitals:   08/19/17 0300 08/19/17 0400 08/19/17 0500 08/19/17 0600  BP: 106/64 105/71 102/61 105/71  Pulse: 79 82 86 81  Resp: 19 17 (!) 23 (!) 21  Temp:      TempSrc:      SpO2: 92% 92% 90% 90%  Weight:      Height:       Physical Exam  Constitutional: He is oriented to person, place, and time and well-developed, well-nourished, and in no distress.  HENT:  Head: Normocephalic and atraumatic.  Eyes: EOM are normal. Pupils are equal, round, and reactive to light.  Neck: Normal range of motion.  Cardiovascular: Normal rate and regular rhythm.  Murmur (systolic) heard. Pulmonary/Chest: Effort normal and breath sounds normal.  Abdominal: Soft. Bowel sounds are normal.  Neurological: He is alert and oriented to person, place, and time.  Skin: Skin is warm and dry.     CMP Latest Ref Rng & Units 08/19/2017  Glucose 65 - 99 mg/dL 154(H)  BUN 6 - 20 mg/dL 14  Creatinine 0.61 - 1.24 mg/dL 1.10  Sodium 135 - 145 mmol/L 131(L)  Potassium 3.5 - 5.1 mmol/L 3.6  Chloride 101 - 111 mmol/L 100(L)  CO2 22 - 32 mmol/L 19(L)  Calcium 8.9 - 10.3 mg/dL 7.3(L)  Total Protein 6.5 - 8.1 g/dL 6.2(L)  Total Bilirubin 0.3 - 1.2 mg/dL 1.4(H)  Alkaline Phos 38 - 126 U/L 167(H)  AST 15 - 41 U/L 45(H)   ALT 17 - 63 U/L 44   CBC Latest Ref Rng & Units 08/19/2017  WBC 3.8 - 10.6 K/uL 1.4(LL)  Hemoglobin 13.0 - 18.0 g/dL 8.2(L)  Hematocrit 40.0 - 52.0 % 25.3(L)  Platelets 150 - 440 K/uL 30(L)    @IMAGES @  Dg Chest 2 View  Result Date: 07/23/2017 CLINICAL DATA:  Chest pain EXAM: CHEST  2 VIEW COMPARISON:  PET-CT 04/15/2017, radiograph 03/06/2017 FINDINGS: Right-sided central venous port tip overlies the SVC. Post sternotomy changes. Cardiomegaly. Mild coarse likely chronic interstitial opacity. No acute consolidation or effusion. Aortic atherosclerosis. No pneumothorax. Surgical clips in the left axilla. IMPRESSION: No active cardiopulmonary disease.  Stable mild cardiomegaly. Electronically Signed   By: Donavan Foil M.D.   On: 07/23/2017 22:22   Ct Coronary Morph W/cta Cor W/score W/ca W/cm &/or Wo/cm  Addendum Date: 07/29/2017   ADDENDUM REPORT: 07/29/2017 13:40 CLINICAL DATA:  Aortic stenosis EXAM: Cardiac TAVR CT TECHNIQUE: The patient was scanned on a Siemens Force 595 slice scanner. A 120 kV retrospective scan was triggered in the ascending thoracic aorta at 140 HU's. Gantry rotation speed was 270 msecs and collimation was .9 mm. No beta blockade or nitro were given. The 3D data set was reconstructed in 5% intervals of the R-R cycle. Systolic and diastolic phases were analyzed on a dedicated work station using MPR, MIP and VRT modes. The patient received 80 cc of contrast. FINDINGS: Aortic Valve: Tri leaflet severely calcified with restricted motion Aorta: Moderate calcific atherosclerotic debris Sinotubular Junction: 30 mm Ascending Thoracic Aorta: 37 mm Aortic Arch: 29 mm Descending Thoracic Aorta: 30 mm Sinus of Valsalva Measurements: Non-coronary: 32 mm Right -coronary: 30 mm Left -coronary: 31 mm Coronary Artery Height above Annulus: Left Main: 10.6 mm above annulus Right Coronary: 13.4 mm above annulus Virtual Basal Annulus Measurements: Maximum/Minimum Diameter: 27.5 mm x  21.9 mm  Perimeter: 77.7 mm Area: 476 mm2 Coronary Arteries: Sufficient height above annulus for deployment. There is a patent RIMA to PDA, Patent LIMA to LAD, Patent sequential SVG to IM/OM and Occluded SVG to diagonal Optimum Fluoroscopic Angle for Delivery: LAO 11 degrees Caudal 14 degrees IMPRESSION: 1. Calcified tri leaflet AV with annulus 476 mm2 suitable for a 26 mm Sapien 3 valve 2. Optimum angiographic angle for delivery LAO 11 degrees Caudal 14 degrees 3. Coronary arteries sufficient height above annulus for deployment Patent RIMA to PDA, Patent LIMA to LAD, Patent sequential SVG IM/OM Occluded SVG Diagonal 4.  No LAA thrombus Jenkins Rouge Electronically Signed   By: Jenkins Rouge M.D.   On: 07/29/2017 13:40   Result Date: 07/29/2017 EXAM: OVER-READ INTERPRETATION  CT CHEST The following report is an over-read performed by radiologist Dr. Vinnie Langton of [facility], [place] on 07/29/2017. This over-read does not include interpretation of cardiac or coronary anatomy or pathology. The coronary CTA interpretation by the cardiologist is attached. COMPARISON:  None. FINDINGS: Extracardiac findings will be described under separate dictation for contemporaneously obtained CTA chest, abdomen and pelvis. IMPRESSION: Please see separate dictation for contemporaneously obtained CTA chest, abdomen and pelvis dated 07/29/2017 for full description of relevant extracardiac findings. Electronically Signed: By: Vinnie Langton M.D. On: 07/29/2017 13:12   Dg Chest Port 1 View  Result Date: 08/19/2017 CLINICAL DATA:  Respiratory failure EXAM: PORTABLE CHEST 1 VIEW COMPARISON:  08/17/2017 FINDINGS: Prior CABG. Mild hyperinflation/COPD. Right Port-A-Cath is unchanged. Cardiomegaly. No confluent opacities or effusions. No acute bony abnormality. IMPRESSION: No acute findings.  No change. Electronically Signed   By: Rolm Baptise M.D.   On: 08/19/2017 07:14   Dg Chest Port 1 View  Result Date: 08/17/2017 CLINICAL DATA:   Acute respiratory failure. EXAM: PORTABLE CHEST 1 VIEW COMPARISON:  08/16/2017 FINDINGS: Right subclavian Port-A-Cath unchanged with tip over the SVC just above the cavoatrial junction. Lungs are adequately inflated without focal consolidation or effusion. Remainder of the exam is unchanged. IMPRESSION: No acute cardiopulmonary disease. Right subclavian Port-A-Cath unchanged. Electronically Signed   By: Marin Olp M.D.   On: 08/17/2017 08:38   Dg Chest Portable 1 View  Result Date: 08/16/2017 CLINICAL DATA:  Diarrhea, shortness of breath for 2 days. EXAM: PORTABLE CHEST 1 VIEW COMPARISON:  07/23/2017 FINDINGS: The right chest wall port a catheter is noted with tip at the cavoatrial junction. Previous median sternotomy and CABG procedure. Aortic atherosclerosis noted. Stable moderate cardiac enlargement. No pleural effusion or edema. No airspace opacities identified. IMPRESSION: 1. Cardiac enlargement.  No acute findings. 2.  Aortic Atherosclerosis (ICD10-I70.0). Electronically Signed   By: Kerby Moors M.D.   On: 08/16/2017 19:20   Ct Angio Chest Aorta W/cm &/or Wo/cm  Result Date: 07/29/2017 CLINICAL DATA:  77 year old male with history of aortic stenosis. Preprocedural study prior to potential transcatheter aortic valve replacement (TAVR) procedure. EXAM: CT ANGIOGRAPHY CHEST, ABDOMEN AND PELVIS TECHNIQUE: Multidetector CT imaging through the chest, abdomen and pelvis was performed using the standard protocol during bolus administration of intravenous contrast. Multiplanar reconstructed images and MIPs were obtained and reviewed to evaluate the vascular anatomy. CONTRAST:  67mL ISOVUE-370 IOPAMIDOL (ISOVUE-370) INJECTION 76% COMPARISON:  PET-CT 04/15/2017. FINDINGS: CTA CHEST FINDINGS Cardiovascular: Heart size is mildly enlarged. There is no significant pericardial fluid, thickening or pericardial calcification. There is aortic atherosclerosis, as well as atherosclerosis of the great vessels of the  mediastinum and the coronary arteries, including calcified atherosclerotic plaque in the left  main, left anterior descending, left circumflex and right coronary arteries. Status post median sternotomy for CABG including LIMA to the LAD. Severe thickening calcification of the aortic valve. Right internal jugular single-lumen porta cath with tip terminating in the right atrium. Mediastinum/Lymph Nodes: No pathologically enlarged mediastinal or hilar lymph nodes. Esophagus is unremarkable in appearance. No axillary lymphadenopathy. Surgical clips are noted in the left axillary region from prior lymph node dissection. Superficial to this there is a 4.2 x 3.2 cm low-attenuation lesion, likely to represent a small postoperative seroma. Lungs/Pleura: No acute consolidative airspace disease. No pleural effusions. No suspicious appearing pulmonary nodules or masses. Musculoskeletal/Soft Tissues: Median sternotomy wires. There are no aggressive appearing lytic or blastic lesions noted in the visualized portions of the skeleton. CTA ABDOMEN AND PELVIS FINDINGS Hepatobiliary: No suspicious cystic or solid hepatic lesions. No intra or extrahepatic biliary ductal dilatation. Gallbladder is normal in appearance. Pancreas: No pancreatic mass. No pancreatic ductal dilatation. No pancreatic or peripancreatic fluid or inflammatory changes. Spleen: Unremarkable. Adrenals/Urinary Tract: Bilateral kidneys and bilateral adrenal glands are normal in appearance. No hydroureteronephrosis. Urinary bladder is mildly trabeculated, but otherwise unremarkable in appearance. Stomach/Bowel: Normal appearance of the stomach. No pathologic dilatation of small bowel or colon. The appendix is not confidently identified and may be surgically absent. Regardless, there are no inflammatory changes noted adjacent to the cecum to suggest the presence of an acute appendicitis at this time. Vascular/Lymphatic: Vascular findings and measurements relevant to  potential TAVR procedure, as detailed below. Aortic atherosclerosis, without evidence of aneurysm. Aneurysmal dilatation of the right common iliac artery which measures up to 2 cm in diameter. Celiac axis, superior mesenteric artery and inferior mesenteric artery are all widely patent without hemodynamically significant stenosis. Two right renal arteries and one left renal artery are all widely patent without hemodynamically significant stenosis. No lymphadenopathy noted in the abdomen or pelvis. Reproductive: Prostate gland and seminal vesicles are unremarkable in appearance. Other: No significant volume of ascites.  No pneumoperitoneum. Musculoskeletal: There are no aggressive appearing lytic or blastic lesions noted in the visualized portions of the skeleton. VASCULAR MEASUREMENTS PERTINENT TO TAVR: AORTA: Minimal Aortic Diameter-12 x 15 mm Severity of Aortic Calcification-moderate to severe RIGHT PELVIS: Right Common Iliac Artery - Minimal Diameter-9.8 x 7.8 mm Tortuosity-mild-to-moderate Calcification-mild Right External Iliac Artery - Minimal Diameter-10.1 x 9.2 mm Tortuosity-moderate Calcification-mild Right Common Femoral Artery - Minimal Diameter-10.0 x 9.0 mm Tortuosity-mild Calcification-mild LEFT PELVIS: Left Common Iliac Artery - Minimal Diameter-8.8 x 7.5 mm Tortuosity-mild Calcification-mild-to-moderate Left External Iliac Artery - Minimal Diameter-10.1 x 8.8 mm Tortuosity-mild Calcification-mild Left Common Femoral Artery - Minimal Diameter-10.6 x 10.1 mm Tortuosity-mild Calcification-mild Review of the MIP images confirms the above findings. IMPRESSION: 1. Vascular findings and measurements pertinent to potential TAVR procedure, as detailed above. 2. Severe thickening calcification of the aortic valve, compatible with the reported clinical history of severe aortic stenosis. 3. Aortic atherosclerosis, in addition to left main and 3 vessel coronary artery disease. Status post median sternotomy for  CABG including LIMA to the LAD. 4. Mild aneurysmal dilatation of the right common iliac artery which measures up to 2.0 cm in diameter. 5. Additional incidental findings, as above. Aortic Atherosclerosis (ICD10-I70.0). Electronically Signed   By: Vinnie Langton M.D.   On: 07/29/2017 15:52   Ct Renal Stone Study  Result Date: 08/16/2017 CLINICAL DATA:  Diarrhea and dyspnea x2 days. EXAM: CT ABDOMEN AND PELVIS WITHOUT CONTRAST TECHNIQUE: Multidetector CT imaging of the abdomen and pelvis was performed following the standard  protocol without IV contrast. COMPARISON:  None. FINDINGS: Lower chest: Low-density appearance of the blood pool within the cardiac chambers with visualization of the interventricular septum compatible with anemia. Bibasilar dependent atelectasis. Small hiatal hernia. Hepatobiliary: No focal liver abnormality is seen. No gallstones, gallbladder wall thickening, or biliary dilatation. Pancreas: Unremarkable. No pancreatic ductal dilatation or surrounding inflammatory changes. Spleen: Normal in size without focal abnormality. Adrenals/Urinary Tract: Normal bilateral adrenal glands. No nephrolithiasis nor obstructive uropathy. Physiologic distention of the urinary bladder without focal mural thickening or calculus. Several small bladder diverticula are noted along the dome and posterior wall. Stomach/Bowel: Stomach is within normal limits. Appendix is surgically absent by report. No evidence of bowel wall thickening, distention, or inflammatory changes. Vascular/Lymphatic: Moderate aortoiliac atherosclerosis without aneurysm. Atherosclerotic calcifications at the origins of both renal arteries without significant renal cortical scarring. Mild ectasia of the right common iliac artery to 2.2 cm. No pelvic sidewall, inguinal nor abdominal adenopathy. Reproductive: Mild enlargement of the prostate impressing upon the base of the bladder measuring up to 5.9 cm craniocaudad. Normal seminal vesicles.  Other: No free air nor free fluid. Musculoskeletal: No acute nor suspicious osseous abnormalities. IMPRESSION: 1. Visualization of the interventricular septum with hypodense appearance of the blood pool compatible with anemia. 2. Bladder diverticulosis. 3. No acute bowel inflammation or inflammation. 4. Moderate aortoiliac and branch vessel atherosclerosis. Electronically Signed   By: Ashley Royalty M.D.   On: 08/16/2017 20:24   Ct Angio Abd/pel W/ And/or W/o  Result Date: 07/29/2017 CLINICAL DATA:  77 year old male with history of aortic stenosis. Preprocedural study prior to potential transcatheter aortic valve replacement (TAVR) procedure. EXAM: CT ANGIOGRAPHY CHEST, ABDOMEN AND PELVIS TECHNIQUE: Multidetector CT imaging through the chest, abdomen and pelvis was performed using the standard protocol during bolus administration of intravenous contrast. Multiplanar reconstructed images and MIPs were obtained and reviewed to evaluate the vascular anatomy. CONTRAST:  43mL ISOVUE-370 IOPAMIDOL (ISOVUE-370) INJECTION 76% COMPARISON:  PET-CT 04/15/2017. FINDINGS: CTA CHEST FINDINGS Cardiovascular: Heart size is mildly enlarged. There is no significant pericardial fluid, thickening or pericardial calcification. There is aortic atherosclerosis, as well as atherosclerosis of the great vessels of the mediastinum and the coronary arteries, including calcified atherosclerotic plaque in the left main, left anterior descending, left circumflex and right coronary arteries. Status post median sternotomy for CABG including LIMA to the LAD. Severe thickening calcification of the aortic valve. Right internal jugular single-lumen porta cath with tip terminating in the right atrium. Mediastinum/Lymph Nodes: No pathologically enlarged mediastinal or hilar lymph nodes. Esophagus is unremarkable in appearance. No axillary lymphadenopathy. Surgical clips are noted in the left axillary region from prior lymph node dissection. Superficial  to this there is a 4.2 x 3.2 cm low-attenuation lesion, likely to represent a small postoperative seroma. Lungs/Pleura: No acute consolidative airspace disease. No pleural effusions. No suspicious appearing pulmonary nodules or masses. Musculoskeletal/Soft Tissues: Median sternotomy wires. There are no aggressive appearing lytic or blastic lesions noted in the visualized portions of the skeleton. CTA ABDOMEN AND PELVIS FINDINGS Hepatobiliary: No suspicious cystic or solid hepatic lesions. No intra or extrahepatic biliary ductal dilatation. Gallbladder is normal in appearance. Pancreas: No pancreatic mass. No pancreatic ductal dilatation. No pancreatic or peripancreatic fluid or inflammatory changes. Spleen: Unremarkable. Adrenals/Urinary Tract: Bilateral kidneys and bilateral adrenal glands are normal in appearance. No hydroureteronephrosis. Urinary bladder is mildly trabeculated, but otherwise unremarkable in appearance. Stomach/Bowel: Normal appearance of the stomach. No pathologic dilatation of small bowel or colon. The appendix is not confidently identified and  may be surgically absent. Regardless, there are no inflammatory changes noted adjacent to the cecum to suggest the presence of an acute appendicitis at this time. Vascular/Lymphatic: Vascular findings and measurements relevant to potential TAVR procedure, as detailed below. Aortic atherosclerosis, without evidence of aneurysm. Aneurysmal dilatation of the right common iliac artery which measures up to 2 cm in diameter. Celiac axis, superior mesenteric artery and inferior mesenteric artery are all widely patent without hemodynamically significant stenosis. Two right renal arteries and one left renal artery are all widely patent without hemodynamically significant stenosis. No lymphadenopathy noted in the abdomen or pelvis. Reproductive: Prostate gland and seminal vesicles are unremarkable in appearance. Other: No significant volume of ascites.  No  pneumoperitoneum. Musculoskeletal: There are no aggressive appearing lytic or blastic lesions noted in the visualized portions of the skeleton. VASCULAR MEASUREMENTS PERTINENT TO TAVR: AORTA: Minimal Aortic Diameter-12 x 15 mm Severity of Aortic Calcification-moderate to severe RIGHT PELVIS: Right Common Iliac Artery - Minimal Diameter-9.8 x 7.8 mm Tortuosity-mild-to-moderate Calcification-mild Right External Iliac Artery - Minimal Diameter-10.1 x 9.2 mm Tortuosity-moderate Calcification-mild Right Common Femoral Artery - Minimal Diameter-10.0 x 9.0 mm Tortuosity-mild Calcification-mild LEFT PELVIS: Left Common Iliac Artery - Minimal Diameter-8.8 x 7.5 mm Tortuosity-mild Calcification-mild-to-moderate Left External Iliac Artery - Minimal Diameter-10.1 x 8.8 mm Tortuosity-mild Calcification-mild Left Common Femoral Artery - Minimal Diameter-10.6 x 10.1 mm Tortuosity-mild Calcification-mild Review of the MIP images confirms the above findings. IMPRESSION: 1. Vascular findings and measurements pertinent to potential TAVR procedure, as detailed above. 2. Severe thickening calcification of the aortic valve, compatible with the reported clinical history of severe aortic stenosis. 3. Aortic atherosclerosis, in addition to left main and 3 vessel coronary artery disease. Status post median sternotomy for CABG including LIMA to the LAD. 4. Mild aneurysmal dilatation of the right common iliac artery which measures up to 2.0 cm in diameter. 5. Additional incidental findings, as above. Aortic Atherosclerosis (ICD10-I70.0). Electronically Signed   By: Vinnie Langton M.D.   On: 07/29/2017 15:52     Assessment and plan- Patient is a 76 y.o. male admitted for worsening SOB and neutropenia  1. Pancytopenia- due to chemotherapy. Wbc improving. Already received neulasta on 08/08/17. Continue to monitor.  2. DLBCL- f/u with Dr. Marin Olp post discharge. PET scheduled for next month. He has completed all his chemotherapy  3.  Rectal bleeding- hemorrhoid symptoms well controlled. No siginifcant rectal bleeding noted  4. Neutropenic fever- 1 episode of fever in the ER with lactic acidosis. Recommend doing 7-10 days of antibiotics. Ok to d/c on PO antibiotics if medically stable  5. Constipation- he is still neutropenic and likely to recover soon. Avoid suppository insertion. Try senna with miralax     Visit Diagnosis 1. Severe sepsis with septic shock (HCC)   2. Pancytopenia ([place])   3. Neutropenic fever ([place])   4. Occult GI bleeding   5. Demand ischemia of myocardium ([place])   6. Exertional dyspnea   7. External hemorrhoid   8. Acute respiratory failure ([place])   9. Respiratory failure ([place])      Dr. Randa Evens, MD, MPH [facility] at [facility] Pager- [phone] 08/19/2017 3:43 PM

## 2017-08-19 NOTE — Progress Notes (Signed)
Pharmacy Antibiotic Note  Richard [name] is a 77 y.o. male admitted on 08/16/2017 with sepsis.  Pharmacy has been consulted for zosyn dosing. Vancomycin discontinued 2/18.   ANC: 1100  Plan: Continue Empiric Piperacillin/tazobactam 3.375 g IV q8h EI per MD  Height: 6' (182.9 cm) Weight: 245 lb 2.4 oz (111.2 kg) IBW/kg (Calculated) : 77.6  Temp (24hrs), Avg:98.9 F (37.2 C), Min:97.4 F (36.3 C), Max:99.8 F (37.7 C)  Recent Labs  Lab 08/16/17 1829 08/16/17 2014 08/17/17 1037 08/18/17 0308 08/19/17 0435  WBC 0.3*  --  0.3* 0.4* 1.4*  CREATININE 1.41*  --  1.33* 1.22 1.10  LATICACIDVEN 4.1* 3.1*  --   --   --     Estimated Creatinine Clearance: 70.1 mL/min (by C-G formula based on SCr of 1.1 mg/dL).    Allergies  Allergen Reactions  . Benazepril Swelling    angioedema; he is not a candidate for any angiotensin receptor blockers because of this significant allergic reaction. Because of a history of documented adverse serious drug reaction;Medi Alert bracelet  is recommended  . Hctz [Hydrochlorothiazide] Anaphylaxis and Swelling    Tongue and lip swelling   . Aspirin Other (See Comments)    Gastritis, cant take 325 Mg aspirin   . Lactose Intolerance (Gi) Nausea And Vomiting    Thank you for allowing pharmacy to be a part of this patient's care.  Pernell Dupre, PharmD, BCPS Clinical Pharmacist 08/19/2017

## 2017-08-19 NOTE — Evaluation (Signed)
Physical Therapy Evaluation Patient Details Name: Richard [name] MRN: [id number] DOB: 07-08-1940 Today's Date: 08/19/2017   History of Present Illness  Pt is a 77 y/o M admitted with sepsis who presented with SOB, chills, sweating, generalized weakness, fever. Pt with right red blood per rectum.  EKG showed atrial flutter.  CT abdomen noted for bladder diverticulosis/atherosclerosis, chest x-ray noted for cardiomegaly. Pt with known h/o B-cell lymphoma s/p 8th round of chemo currently under evaluation for TAVR, hopefully to have this done in March 2019 as planned.  Pt's PMH includes spinal stenosis, CABG x5, R lumbar laminectomy/decompression L4-5, portacath placement, R shoulder surgery.     Clinical Impression  Pt admitted with above diagnosis. Pt currently with functional limitations due to the deficits listed below (see PT Problem List). Richard [name] appears to present at baseline level of mobility with ambulatory distance limited with pending TAVR to be completed in March 2019.    SpO2 remains at or above 99% on 3L O2 when ambulating first 15 ft and remains at or above 98% on 1L O2 when ambulating second 15 ft. Pt encouraged to have someone by his side when ambulating at home as he tends to reach out for support when resting.  He will have 24/7 assist/supervision from family at d/c.  Did not complete any resistive exercises this session as pt's platelets low at 30.  As pt will be medically limited until his TAVR, recommend PT following this procedure with ambulation in home being his main source of exercise until then.  Pt will benefit from skilled PT to increase their independence and safety with mobility to allow discharge to the venue listed below.      Follow Up Recommendations Other (comment)(no formal PT recommended until after heart surgery)    Equipment Recommendations  None recommended by PT    Recommendations for Other Services       Precautions / Restrictions  Precautions Precautions: Fall;Other (comment) Precaution Comments: port R chest, O2, monitor HR Restrictions Weight Bearing Restrictions: No      Mobility  Bed Mobility Overal bed mobility: Modified Independent             General bed mobility comments: Increased time but no physical assist or cues needed.   Transfers Overall transfer level: Needs assistance Equipment used: None Transfers: Sit to/from Stand Sit to Stand: Min guard         General transfer comment: Min guard provided as pt demonstrates mild instability but no LOB.    Ambulation/Gait Ambulation/Gait assistance: Min guard Ambulation Distance (Feet): 30 Feet Assistive device: None Gait Pattern/deviations: Step-through pattern;Decreased stride length Gait velocity: decreased Gait velocity interpretation: Below normal speed for age/gender General Gait Details: Pt ambulates 15 ft around bed occasionally reaching out for bed rail when resting.  Takes several standing rest breaks due to fatigue.  Pt declines offer to sit to rest before ambulating an additional 15 ft around bed again.  SpO2 remains at or above 99% on 3L O2 when ambulating first 15 ft and remains at or above 98% on 1L O2 when ambulating second 15 ft.    Stairs            Wheelchair Mobility    Modified Rankin (Stroke Patients Only)       Balance Overall balance assessment: Needs assistance;History of Falls Sitting-balance support: No upper extremity supported;Feet supported Sitting balance-Leahy Scale: Good     Standing balance support: No upper extremity supported;During functional activity Standing balance-Leahy Scale: Fair  Standing balance comment: Pt able to maintain balance with static standing without  UE support but would likely lose his balance with perturbation.                              Pertinent Vitals/Pain Pain Assessment: No/denies pain    Home Living Family/patient expects to be discharged to::  Private residence Living Arrangements: Children;Spouse/significant other Available Help at Discharge: Family;Available 24 hours/day Type of Home: House Home Access: Stairs to enter Entrance Stairs-Rails: None(pt says, "I haven't had any problems with this") Entrance Stairs-Number of Steps: 3 Home Layout: Able to live on main level with bedroom/bathroom;Two level Home Equipment: (3 wheeled rollator) Additional Comments: Pt to stay at his daughter's house at d/c so information above reflects this home living situation    Prior Function Level of Independence: Independent with assistive device(s)         Comments: Pt ambualting limited distances due to heart valve status.  Sits to take rest breaks when needed.  Reports 2 falls in the past 6 months.  Pt independent with ADLs and is still driving.      Hand Dominance        Extremity/Trunk Assessment   Upper Extremity Assessment Upper Extremity Assessment: (BUE strength grossly 4/5)    Lower Extremity Assessment Lower Extremity Assessment: (BLE strength grossly 4-/5)       Communication   Communication: No difficulties  Cognition Arousal/Alertness: Awake/alert Behavior During Therapy: WFL for tasks assessed/performed Overall Cognitive Status: Within Functional Limits for tasks assessed                                        General Comments General comments (skin integrity, edema, etc.): VSS throughout session.  No resistive exercises this session with most recent platelet reading 30.      Exercises     Assessment/Plan    PT Assessment Patient needs continued PT services  PT Problem List Decreased strength;Decreased balance;Decreased knowledge of use of DME;Decreased safety awareness;Cardiopulmonary status limiting activity;Pain       PT Treatment Interventions DME instruction;Gait training;Stair training;Functional mobility training;Therapeutic activities;Therapeutic exercise;Balance  training;Neuromuscular re-education;Patient/family education    PT Goals (Current goals can be found in the Care Plan section)  Acute Rehab PT Goals Patient Stated Goal: to return to PLOF PT Goal Formulation: With patient Time For Goal Achievement: 09/02/17 Potential to Achieve Goals: Good    Frequency Min 2X/week   Barriers to discharge        Co-evaluation               AM-PAC PT "6 Clicks" Daily Activity  Outcome Measure Difficulty turning over in bed (including adjusting bedclothes, sheets and blankets)?: None Difficulty moving from lying on back to sitting on the side of the bed? : A Little Difficulty sitting down on and standing up from a chair with arms (e.g., wheelchair, bedside commode, etc,.)?: A Lot Help needed moving to and from a bed to chair (including a wheelchair)?: A Little Help needed walking in hospital room?: A Little Help needed climbing 3-5 steps with a railing? : A Lot 6 Click Score: 17    End of Session Equipment Utilized During Treatment: Gait belt;Oxygen Activity Tolerance: Patient tolerated treatment well;Patient limited by fatigue Patient left: in bed;with call bell/phone within reach;with family/visitor present;Other (comment)(with pharmacy at bedside) Nurse Communication:  Mobility status;Other (comment)(SpO2) PT Visit Diagnosis: Unsteadiness on feet (R26.81);Other abnormalities of gait and mobility (R26.89);Muscle weakness (generalized) (M62.81)    Time: 0277-4128 PT Time Calculation (min) (ACUTE ONLY): 22 min   Charges:   PT Evaluation $PT Eval High Complexity: 1 High     PT G Codes:        Collie Siad PT, DPT 08/19/2017, 2:48 PM

## 2017-08-20 LAB — CBC WITH DIFFERENTIAL/PLATELET
BASOS ABS: 0 10*3/uL (ref 0–0.1)
Basophils Relative: 1 %
EOS ABS: 0 10*3/uL (ref 0–0.7)
EOS PCT: 1 %
HEMATOCRIT: 24.6 % — AB (ref 40.0–52.0)
Hemoglobin: 7.9 g/dL — ABNORMAL LOW (ref 13.0–18.0)
LYMPHS PCT: 6 %
Lymphs Abs: 0.1 10*3/uL — ABNORMAL LOW (ref 1.0–3.6)
MCH: 27.8 pg (ref 26.0–34.0)
MCHC: 32.1 g/dL (ref 32.0–36.0)
MCV: 86.8 fL (ref 80.0–100.0)
MONO ABS: 0.2 10*3/uL (ref 0.2–1.0)
Monocytes Relative: 9 %
Neutro Abs: 1.6 10*3/uL (ref 1.4–6.5)
Neutrophils Relative %: 83 %
PLATELETS: 34 10*3/uL — AB (ref 150–440)
RBC: 2.83 MIL/uL — ABNORMAL LOW (ref 4.40–5.90)
RDW: 18.6 % — AB (ref 11.5–14.5)
WBC: 1.9 10*3/uL — ABNORMAL LOW (ref 3.8–10.6)

## 2017-08-20 LAB — GLUCOSE, CAPILLARY
GLUCOSE-CAPILLARY: 142 mg/dL — AB (ref 65–99)
Glucose-Capillary: 112 mg/dL — ABNORMAL HIGH (ref 65–99)
Glucose-Capillary: 88 mg/dL (ref 65–99)
Glucose-Capillary: 143 mg/dL — ABNORMAL HIGH (ref 65–99)

## 2017-08-20 MED ORDER — LACTULOSE 10 GM/15ML PO SOLN
20.0000 g | Freq: Two times a day (BID) | ORAL | Status: DC | PRN
  Administered 2017-08-20: 20 g via ORAL
  Filled 2017-08-20: qty 30

## 2017-08-20 NOTE — Progress Notes (Signed)
Patient refused bed alarm. Educated patient on falls. Patient verbalizes understanding. Will continue to monitor.

## 2017-08-20 NOTE — Progress Notes (Signed)
[facility] [place] NAME: Richard [name]    MR#:  [id number]  DATE OF BIRTH:  Nov 09, 1940  SUBJECTIVE:  CHIEF COMPLAINT:   Chief Complaint  Patient presents with  . Diarrhea    Came with fever, weakness, some blood in the stool.  Noted to be anemic, has recent chemotherapy and so his white blood cell count and platelets are also running low. Also has significant cardiac issues with valvular insufficiencies and plan is for valvular surgery recently. After IV fluids and some transfusion he was hypoxic and pulmonary edema so transferred to ICU and received some Lasix, and is much better this morning. Hemoglobin is stable. WBCs count improved some, hemoglobin stable, platelets are still low.  c/o pain due to hemorrhoids.  REVIEW OF SYSTEMS:  CONSTITUTIONAL: No fever, positive for fatigue or weakness.  EYES: No blurred or double vision.  EARS, NOSE, AND THROAT: No tinnitus or ear pain.  RESPIRATORY: No cough, shortness of breath, wheezing or hemoptysis.  CARDIOVASCULAR: No chest pain, orthopnea, edema.  GASTROINTESTINAL: No nausea, vomiting, diarrhea or abdominal pain. Blood in stool. GENITOURINARY: No dysuria, hematuria.  ENDOCRINE: No polyuria, nocturia,  HEMATOLOGY: No anemia, easy bruising or bleeding SKIN: No rash or lesion. MUSCULOSKELETAL: No joint pain or arthritis.   NEUROLOGIC: No tingling, numbness, weakness.  PSYCHIATRY: No anxiety or depression.    ROS  DRUG ALLERGIES:   Allergies  Allergen Reactions  . Benazepril Swelling    angioedema; he is not a candidate for any angiotensin receptor blockers because of this significant allergic reaction. Because of a history of documented adverse serious drug reaction;Medi Alert bracelet  is recommended  . Hctz [Hydrochlorothiazide] Anaphylaxis and Swelling    Tongue and lip swelling   . Aspirin Other (See Comments)    Gastritis, cant take 325 Mg aspirin   . Lactose Intolerance (Gi)  Nausea And Vomiting    VITALS:  Blood pressure 114/63, pulse 72, temperature 97.8 F (36.6 C), temperature source Oral, resp. rate 20, height 6' (1.829 m), weight 111.2 kg (245 lb 2.4 oz), SpO2 97 %.  PHYSICAL EXAMINATION:   GENERAL:  77 y.o.-year-old patient lying in the bed with acute distress due to hemorrhoids..  Morbid obesity EYES: Pupils equal, round, reactive to light and accommodation. No scleral icterus. Extraocular muscles intact.  HEENT: Head atraumatic, normocephalic. Oropharynx and nasopharynx clear.  Dry mucous membranes NECK:  Supple, no jugular venous distention. No thyroid enlargement, no tenderness.  Poor skin turgor LUNGS: Normal breath sounds bilaterally, no wheezing, some crepitation. No use of accessory muscles of respiration.  CARDIOVASCULAR: S1, S2 normal. No murmurs, rubs, or gallops.  ABDOMEN: Soft, nontender, nondistended. Bowel sounds present. No organomegaly or mass.  Nonthrombosed external hemorrhoid, tenderness to palpation EXTREMITIES: No pedal edema, cyanosis, or clubbing.  NEUROLOGIC: Cranial nerves II through XII are intact. Muscle strength 4-5/5 in all extremities. Sensation intact. Gait not checked.  PSYCHIATRIC: The patient is alert and oriented x 3.  SKIN: No obvious rash, lesion, or ulcer.    Physical Exam LABORATORY PANEL:   CBC Recent Labs  Lab 08/20/17 0613  WBC 1.9*  HGB 7.9*  HCT 24.6*  PLT 34*   ------------------------------------------------------------------------------------------------------------------  Chemistries  Recent Labs  Lab 08/17/17 1037  08/19/17 0435  NA 135   < > 131*  K 3.5   < > 3.6  CL 106   < > 100*  CO2 20*   < > 19*  GLUCOSE 142*   < >  154*  BUN 20   < > 14  CREATININE 1.33*   < > 1.10  CALCIUM 7.3*   < > 7.3*  MG 2.0  --   --   AST 28  --  45*  ALT 16*  --  44  ALKPHOS 55  --  167*  BILITOT 1.2  --  1.4*   < > = values in this interval not displayed.    ------------------------------------------------------------------------------------------------------------------  Cardiac Enzymes Recent Labs  Lab 08/17/17 0030 08/17/17 1037  TROPONINI 0.45* 0.63*   ------------------------------------------------------------------------------------------------------------------  RADIOLOGY:  Dg Chest Port 1 View  Result Date: 08/19/2017 CLINICAL DATA:  Respiratory failure EXAM: PORTABLE CHEST 1 VIEW COMPARISON:  08/17/2017 FINDINGS: Prior CABG. Mild hyperinflation/COPD. Right Port-A-Cath is unchanged. Cardiomegaly. No confluent opacities or effusions. No acute bony abnormality. IMPRESSION: No acute findings.  No change. Electronically Signed   By: Rolm Baptise M.D.   On: 08/19/2017 07:14    ASSESSMENT AND PLAN:   Active Problems:   GIB (gastrointestinal bleeding)   Acute hypoxemic respiratory failure (HCC)   Demand ischemia of myocardium (HCC)   Typical atrial flutter (HCC)   Acute on chronic diastolic heart failure (HCC)   1 acute on chronic symptomatic worsening anemia/pancytopenia Secondary to acute on subacute GI bleeding, exacerbated by chemotherapy for lymphoma Suspect due to hemorrhoidal disease   PPI daily, clear liquid diet upgraded to regular, transfuse 2 units packed red blood cells, 3 pack of platelets, CBC daily, consult oncology and GI  strict I&O monitoring, neutropenic precautions, and continue close medical monitoring   GI and oncology suggested currently to monitor because of multiple medical issues.   Stool softner. Local anusol cream.  Spoke to surgery- on phone, as per them due to multiple issues, he is not a candidate for hemorrhoidal surgery.   GI have suggested conservative management already. Added lactulose.  2 acute BRBPR/GIB- hemorrhoidal bleed. Plan of care as stated above  3 acute elevated troponins/possible due to stress   avoid anticoagulants given acute blood loss anemia, antihypertensives on hold  given relative hypotension, nitrates as needed, supplemental oxygen as needed, IV morphine as needed  Heart catheterization from January 2019 : -Severe native three-vessel coronary artery disease with moderate distal left main stenosis, total occlusion of the LAD, total occlusion of the ramus intermedius, total occlusion of the first OM, and total occlusion of the RCA. -Status post aortocoronary bypass surgery with continued patency of the RIMA to PDA, LIMA to LAD, and sequential saphenous vein graft to ramus intermedius and first OM branches of the circumflex. -Chronic occlusion of the saphenous vein graft to diagonal, the diagonal branch is collateralized by the apical LAD -Severe calcific aortic stenosis with a mean transvalvular gradient of 51 mmHg  Appreciated help by cardiologist,  as mentioned above, continue monitoring currently and optimize the health. Advised to follow as planned outpatient for valvular surgery.  4  sepsis Continue sepsis protocol, empiric vancomycin/Zosyn with pharmacy to dose, follow-up on cultures Could also be related to neutropenic fever in setting of symptomatic worsening pancytopenia/anemia-plan of care as stated above.   As per oncology, finish 7 days Abx course.  5 acute kidney injury Baseline renal function normal Exacerbated by acute blood loss anemia Avoid nephrotoxic agents, strict I&O monitoring, daily weights, BMP daily, hold Lasix given acute dehydrated state   Improving.  6 acute dehydration Exacerbated by acute blood loss anemia and dehydration IV fluids for rehydration and blood transfusion as stated above  7 morbid obesity Most  likely secondary to excess calories Lifestyle modification recommended  8 chronic obstructive sleep apnea Stable CPAP at bedtime  9 chronic diabetes mellitus type 2 Hold metformin, on glimeperide, sliding scale insulin with Accu-Cheks per routine  10 acute symptomatic hemorrhoidal disease,  nonthrombosed Anusol twice daily for 5-day course   All the records are reviewed and case discussed with Care Management/Social Workerr. Management plans discussed with the patient, family and they are in agreement.  CODE STATUS: Full.  TOTAL TIME TAKING CARE OF THIS PATIENT: 35 minutes.   Discussed with his daughter in the room and with ICU physician and cardiologist.  POSSIBLE D/C IN 1-2 DAYS, DEPENDING ON CLINICAL CONDITION. PT eval.  Vaughan Basta M.D on 08/20/2017   Between 7am to 6pm - Pager - [phone]  After 6pm go to [email or web address] - password [email or web address] [facility]  Office  [phone]  CC: Primary care physician; [name], DO  Note: This dictation was prepared with Dragon dictation along with smaller phrase technology. Any transcriptional errors that result from this process are unintentional.

## 2017-08-20 NOTE — Care Management Important Message (Signed)
Important Message  Patient Details  Name: Richard [name] MRN: [id number] Date of Birth: 1941-03-12   Medicare Important Message Given:  Yes    Shelbie Ammons, RN 08/20/2017, 7:09 AM

## 2017-08-20 NOTE — Progress Notes (Signed)
Pt states he no longer uses O2 with cpap.

## 2017-08-20 NOTE — Plan of Care (Signed)
  Not Progressing Clinical Measurements: Will remain free from infection 08/20/2017 1346 - Not Progressing by Etheleen Nicks, RN Note Receiving antibiotics Diagnostic test results will improve 08/20/2017 1346 - Not Progressing by Etheleen Nicks, RN Note Remain neutropenic at 1.9 Hgb 7.9 this am Elimination: Will not experience complications related to bowel motility 08/20/2017 1346 - Not Progressing by Etheleen Nicks, RN Note Pt taking Miralax, senna and given a prn dose of lactulose this am Pain Managment: General experience of comfort will improve 08/20/2017 1346 - Not Progressing by Etheleen Nicks, RN Note Prn medications

## 2017-08-21 ENCOUNTER — Other Ambulatory Visit: Payer: Self-pay | Admitting: *Deleted

## 2017-08-21 LAB — CBC WITH DIFFERENTIAL/PLATELET
BASOS ABS: 0 10*3/uL (ref 0–0.1)
Basophils Relative: 1 %
Eosinophils Absolute: 0 10*3/uL (ref 0–0.7)
Eosinophils Relative: 0 %
HCT: 24.8 % — ABNORMAL LOW (ref 40.0–52.0)
Hemoglobin: 8.1 g/dL — ABNORMAL LOW (ref 13.0–18.0)
LYMPHS ABS: 0.2 10*3/uL — AB (ref 1.0–3.6)
Lymphocytes Relative: 5 %
MCH: 28.4 pg (ref 26.0–34.0)
MCHC: 32.7 g/dL (ref 32.0–36.0)
MCV: 86.6 fL (ref 80.0–100.0)
MONO ABS: 0.3 10*3/uL (ref 0.2–1.0)
Monocytes Relative: 10 %
NEUTROS PCT: 84 %
Neutro Abs: 2.7 10*3/uL (ref 1.4–6.5)
PLATELETS: 58 10*3/uL — AB (ref 150–440)
RBC: 2.86 MIL/uL — AB (ref 4.40–5.90)
RDW: 18.9 % — AB (ref 11.5–14.5)
WBC: 3.2 10*3/uL — AB (ref 3.8–10.6)

## 2017-08-21 LAB — BASIC METABOLIC PANEL
Anion gap: 8 (ref 5–15)
BUN: 12 mg/dL (ref 6–20)
CO2: 23 mmol/L (ref 22–32)
Calcium: 7.3 mg/dL — ABNORMAL LOW (ref 8.9–10.3)
Chloride: 103 mmol/L (ref 101–111)
Creatinine, Ser: 1.02 mg/dL (ref 0.61–1.24)
GFR calc non Af Amer: >60 mL/min (ref >60)
Glucose, Bld: 111 mg/dL — ABNORMAL HIGH (ref 65–99)
POTASSIUM: 4 mmol/L (ref 3.5–5.1)
SODIUM: 134 mmol/L — AB (ref 135–145)

## 2017-08-21 LAB — MAGNESIUM: MAGNESIUM: 2.4 mg/dL (ref 1.7–2.4)

## 2017-08-21 LAB — CULTURE, BLOOD (ROUTINE X 2)
Culture: NO GROWTH
Culture: NO GROWTH

## 2017-08-21 LAB — GLUCOSE, CAPILLARY
GLUCOSE-CAPILLARY: 137 mg/dL — AB (ref 65–99)
Glucose-Capillary: 106 mg/dL — ABNORMAL HIGH (ref 65–99)

## 2017-08-21 MED ORDER — FUROSEMIDE 40 MG PO TABS: 40.0000 mg | ORAL_TABLET | Freq: Every day | ORAL | 5 refills | Status: DC

## 2017-08-21 MED ORDER — POLYETHYLENE GLYCOL 3350 17 G PO PACK: 17.0000 g | PACK | Freq: Every day | ORAL | 0 refills | Status: DC

## 2017-08-21 MED ORDER — POTASSIUM CHLORIDE CRYS ER 20 MEQ PO TBCR: 20.0000 meq | EXTENDED_RELEASE_TABLET | Freq: Every day | ORAL | 5 refills | Status: DC

## 2017-08-21 MED ORDER — AMOXICILLIN-POT CLAVULANATE 875-125 MG PO TABS: 1.0000 | ORAL_TABLET | Freq: Two times a day (BID) | ORAL | 0 refills | Status: AC

## 2017-08-21 MED ORDER — HEPARIN SOD (PORK) LOCK FLUSH 100 UNIT/ML IV SOLN
500.0000 [IU] | Freq: Once | INTRAVENOUS | Status: AC
  Administered 2017-08-21: 14:00:00 500 [IU] via INTRAVENOUS
  Filled 2017-08-21: qty 5

## 2017-08-21 MED ORDER — SENNOSIDES-DOCUSATE SODIUM 8.6-50 MG PO TABS: 1.0000 | ORAL_TABLET | Freq: Two times a day (BID) | ORAL | 0 refills | Status: DC

## 2017-08-21 MED ORDER — HYDROCORTISONE 2.5 % RE CREA: TOPICAL_CREAM | Freq: Two times a day (BID) | RECTAL | 0 refills | Status: AC

## 2017-08-21 NOTE — Progress Notes (Signed)
Patient refuses bed and chair alarm. Patient educated on the purpose of the alarms.

## 2017-08-21 NOTE — Progress Notes (Signed)
Patient discharged home per MD order. Prescriptions given to patient. All discharge instructions given to patient and daughter and all questions answered.

## 2017-08-21 NOTE — Discharge Instructions (Signed)
Follow with cancer center and Cardiology at [facility] in 1 week.

## 2017-08-22 ENCOUNTER — Telehealth: Payer: Self-pay

## 2017-08-22 ENCOUNTER — Telehealth: Payer: Self-pay | Admitting: Cardiology

## 2017-08-22 NOTE — Telephone Encounter (Signed)
Attempted to contact patient to complete TCM and schedule hospital f/u. No VM available.

## 2017-08-22 NOTE — Telephone Encounter (Signed)
I cannot find the discharge summary from [place].  I do not understand why they reduced his meds so significantly.  See if he can come in to see Christell Faith early next week since he saw him in the hospital.

## 2017-08-22 NOTE — Telephone Encounter (Signed)
Patient was calling back to discuss increasing his lasix back to 120 mg daily.   He denies shortness of breath and chest pain. He is currently elevating his feet which he states are swelling.   He has been informed that the message has been sent to the provider. He has an appointment on 3/25.

## 2017-08-22 NOTE — Telephone Encounter (Signed)
New Message      Pt c/o swelling: STAT is pt has developed SOB within 24 hours  1) How much weight have you gained and in what time span?  no  2) If swelling, where is the swelling located? Feet   3) Are you currently taking a fluid pill? The cut it back to 1 a day  4) Are you currently SOB? no  5) Do you have a log of your daily weights (if so, list)? no  6) Have you gained 3 pounds in a day or 5 pounds in a week? no  7) Have you traveled recently? No   Patient just released from the hospital and the hospital stopped most of his medications, he has concerns, already this morning his feet are already swollen

## 2017-08-22 NOTE — Telephone Encounter (Signed)
F/U call:  Patient states that his medication decreased from 6 pills down to 1 pill. Patient states that his feet are swelling up and that the recommendations are not working.

## 2017-08-22 NOTE — Telephone Encounter (Signed)
Returned call to pt he states that he was recently d/c from [facility] 08-21-17 and states that they changed his lasix from 120mg  BID to 40mg  daily and changed his potassium from 40 mg BID to 20 mg BID. Pt states that he has not taken his medication yet but has increased swelling, (sounds like at least 2+) he states that his thumb has a "deep indentation when pressing" he will take his weight and his morning medications and call back if any sx develop or he will go directly to the ER if needed. Appt scheduled for Monday for TOC to be evaluated for his swelling and medication adjustment. He states that he will "take it easy and not exert" himself until appt. Please advise. Any changes/suggestions until appt?

## 2017-08-23 NOTE — Progress Notes (Signed)
Cardiology Office Note   Date:  08/25/2017   ID:  Richard [name], DOB 11-Dec-1940, MRN [id number]  PCP:  [name], DO  Cardiologist:  Dr. Percival Spanish Chief Complaint  Patient presents with  . Shortness of Breath  . Hospitalization Follow-up     History of Present Illness: Richard [name] is a 77 y.o. male who presents for posthospitalization follow-up.  He was recently discharged from [facility] in [place], [place].  He has a history of coronary artery disease, CABG in 2001, severe aortic valve stenosis, with plan with planned TAVR, hypercholesterolemia, and diabetes.  During hospitalization the patient had elevated troponin, he was found to have pancytopenia and GI bleed and given blood transfusion.  Since discharge, the patient has begun to retain fluid.  He feels this is related to medication changes on discharge.  There is no discharge summary available to review.  He called our office on 08/22/2017 stating that they changed his Lasix from 120 mg twice daily to 40 mg daily, and changed his potassium from 40 mEq twice daily to 20 mEq twice daily.  He was complaining of increased swelling, "his thumb leaving deep indention when pressing" he was advised that if his swelling became worse or he had trouble breathing he was to be seen in the ER.  He was to continue daily weights and salt restriction.   He states that he has returned to his previous dose of Lasix at 120 mg twice a day, and 40 mEq of potassium twice a day. He has had significant improvement in his lower extremity edema and has lost approximately 6-7 pounds. He began going back to his normal regimen 2 days ago. He still anxious and eager to proceed with TAVR. This is scheduled for 09/09/2017.  The patient is also being treated by Dr. Jonette Eva, oncologist, in the setting of diffuse large cell non--Hodgkin's lymphoma and pernicious anemia. The patient is due to see oncology on 09/01/2017 her PET scan and blood  work.  He states he is feeling much better after returning to his current diuretic regimen. He denies any muscle cramping, or weakness. He continues to have some mild dyspnea on exertion..  Past Medical History:  Diagnosis Date  . Anemia   . Axillary adenopathy 02/25/2017  . Bradycardia    a. holter monitor has demonstrated HRs in 30s and [place]   . CAD (coronary artery disease)    a. s/p CABG 2001  . Chronic lower back pain   . Diffuse large B cell lymphoma ([place])   . Diverticulosis   . Esophageal stricture   . GERD (gastroesophageal reflux disease)   . History of gout   . HTN (hypertension)   . Mixed hyperlipidemia   . OSA on CPAP    with 2L O2 at night  . Pancytopenia ([place])    a. related to chemo therapy for B cell lymphoma  . Peptic stricture of esophagus   . Severe aortic stenosis   . Spinal stenosis   . Type II diabetes mellitus ([place])     Past Surgical History:  Procedure Laterality Date  . APPENDECTOMY  ~ 1952  . BACK SURGERY    . CATARACT EXTRACTION W/PHACO Left 11/06/2016   Procedure: CATARACT EXTRACTION PHACO AND INTRAOCULAR LENS PLACEMENT (IOC);  Surgeon: Estill Cotta, MD;  Location: [facility] ORS;  Service: Ophthalmology;  Laterality: Left;  Lot # [id number] H [place]: 01:09.4 AP%:25.2 CDE: 30.64  . CATARACT EXTRACTION W/PHACO Right 12/04/2016   Procedure: CATARACT  EXTRACTION PHACO AND INTRAOCULAR LENS PLACEMENT (IOC);  Surgeon: Estill Cotta, MD;  Location: [facility] ORS;  Service: Ophthalmology;  Laterality: Right;  [place] 1:25.9 AP% 24.1 CDE 39.10 Fluid Pack lot # [id number]  . COLONOSCOPY W/ BIOPSIES AND POLYPECTOMY  2013  . CORONARY ANGIOPLASTY  1993  . CORONARY ANGIOPLASTY WITH STENT PLACEMENT  05/1997   "1"  . CORONARY ARTERY BYPASS GRAFT  03/2000   "CABG X5"  . ESOPHAGEAL DILATION  X 3-4   Dr. Lyla Son; "last one was in the 1990's"  . ESOPHAGOGASTRODUODENOSCOPY     multiple  . FLEXIBLE SIGMOIDOSCOPY     multiple  . HYDRADENITIS EXCISION Left 02/25/2017    Procedure: EXCISION DEEP LEFT AXILLARY LYMPH NODE;  Surgeon: Fanny Skates, MD;  Location: [facility];  Service: General;  Laterality: Left;  . KNEE ARTHROSCOPY Left 2011   meniscus repair  . LEFT HEART CATHETERIZATION WITH CORONARY/GRAFT ANGIOGRAM N/A 03/16/2014   Procedure: LEFT HEART CATHETERIZATION WITH Beatrix Fetters;  Surgeon: Burnell Blanks, MD;  Location: [facility] CATH LAB;  Service: Cardiovascular;  Laterality: N/A;  . LUMBAR LAMINECTOMY/DECOMPRESSION MICRODISCECTOMY Right 06/17/2013   Procedure: LUMBAR LAMINECTOMY MICRODISCECTOMY L4-L5 RIGHT EXCISION OF SYNOVIAL CYST RIGHT   (1 LEVEL) RIGHT PARTIAL FACETECTOMY;  Surgeon: Tobi Bastos, MD;  Location: [facility] ORS;  Service: Orthopedics;  Laterality: Right;  . MYELOGRAM  04/06/13   lumbar, Dr Gladstone Lighter  . PANENDOSCOPY    . PORTACATH PLACEMENT N/A 03/06/2017   Procedure: INSERTION PORT-A-CATH AND ASPIRATE SEROMA LEFT AXILLA;  Surgeon: Fanny Skates, MD;  Location: [facility];  Service: General;  Laterality: N/A;  . RIGHT/LEFT HEART CATH AND CORONARY/GRAFT ANGIOGRAPHY N/A 07/28/2017   Procedure: RIGHT/LEFT HEART CATH AND CORONARY/GRAFT ANGIOGRAPHY;  Surgeon: Sherren Mocha, MD;  Location: [place] CV LAB;  Service: Cardiovascular;  Laterality: N/A;  . SHOULDER SURGERY Right 08/2010   screws placed; "tendons tore off"  . SKIN CANCER EXCISION Right    "neck"  . TONSILLECTOMY  ~ 1954  . UPPER GI ENDOSCOPY  2013   Gastritis; Dr Carlean Purl  . VASECTOMY       Current Outpatient Medications  Medication Sig Dispense Refill  . allopurinol (ZYLOPRIM) 300 MG tablet Take 450 mg by mouth daily.     [name] amLODipine (NORVASC) 5 MG tablet TAKE 2 TABLETS (10 MG TOTAL) BY MOUTH DAILY. (Patient taking differently: TAKE 5 MG BY MOUTH TWICE DAILY.) 60 tablet 11  . aspirin 81 MG tablet Take 81 mg by mouth daily.    [name] atorvastatin (LIPITOR) 80 MG tablet TAKE ONE TABLET BY MOUTH AT BEDTIME 90 tablet 3  . esomeprazole (NEXIUM) 40 MG capsule Take 1 capsule (40  mg total) by mouth daily. 30 capsule 5  . ezetimibe (ZETIA) 10 MG tablet TAKE ONE TABLET BY MOUTH DAILY 90 tablet 3  . famciclovir (FAMVIR) 500 MG tablet Take 1 tablet (500 mg total) by mouth daily. 30 tablet 12  . fluticasone (FLONASE) 50 MCG/ACT nasal spray INHALE ONE SPRAY INTO EACH NOSTRIL TWICE DAILY. 16 g 6  . furosemide (LASIX) 40 MG tablet Take 1 tablet (40 mg total) by mouth daily. (Patient taking differently: Take 40 mg by mouth daily. ) 180 tablet 5  . glimepiride (AMARYL) 2 MG tablet TAKE 1 TABLET (2 MG TOTAL) BY MOUTH DAILY WITH BREAKFAST. 90 tablet 0  . glucose blood (FREESTYLE LITE) test strip TEST BLOOD SUGAR bid 100 each 11  . hydrocortisone (ANUSOL-HC) 2.5 % rectal cream Place rectally 2 (two) times daily for 5 days.  28 g 0  . Lancets (FREESTYLE) lancets Use BID to check blood sugar.  DX E11.9 100 each 6  . lidocaine-prilocaine (EMLA) cream Apply to affected area once (Patient taking differently: Apply 1 application topically as needed (to port). ) 30 g 3  . lipase/protease/amylase (CREON) 36000 UNITS CPEP capsule Take 2 capsules (72,000 Units total) by mouth 3 (three) times daily with meals. 1 capsule with snack. 210 capsule 2  . LORazepam (ATIVAN) 0.5 MG tablet Take 0.5 mg by mouth every 6 (six) hours as needed for anxiety.    . NEOMYCIN-POLYMYXIN-HYDROCORTISONE (CORTISPORIN) 1 % SOLN OTIC solution Apply 1-2 drops to toe BID after soaking 10 mL 1  . nitroGLYCERIN (NITROSTAT) 0.4 MG SL tablet Place 1 tablet (0.4 mg total) under the tongue every 5 (five) minutes as needed for chest pain. 25 tablet 3  . ondansetron (ZOFRAN) 8 MG tablet Take 1 tablet (8 mg total) by mouth 2 (two) times daily as needed for refractory nausea / vomiting. Start on day 3 after cyclophosphamide chemotherapy. 30 tablet 1  . polycarbophil (FIBERCON) 625 MG tablet Take 625 mg by mouth daily.    . polyethylene glycol (MIRALAX / GLYCOLAX) packet Take 17 g by mouth daily. 14 each 0  . potassium chloride SA  (KLOR-CON M20) 20 MEQ tablet Take 1 tablet (20 mEq total) by mouth daily. 120 tablet 5  . pregabalin (LYRICA) 75 MG capsule Take 1 capsule (75 mg total) by mouth 3 (three) times daily. 270 capsule 3  . prochlorperazine (COMPAZINE) 10 MG tablet Take 1 tablet (10 mg total) by mouth every 6 (six) hours as needed (Nausea or vomiting). 30 tablet 6  . senna-docusate (SENOKOT-S) 8.6-50 MG tablet Take 1 tablet by mouth 2 (two) times daily. 30 tablet 0   No current facility-administered medications for this visit.    Facility-Administered Medications Ordered in Other Visits  Medication Dose Route Frequency Provider Last Rate Last Dose  . sodium chloride flush (NS) 0.9 % injection 10 mL  10 mL Intravenous PRN [name], MD   10 mL at 05/30/17 0920    Allergies:   Benazepril; Hctz [hydrochlorothiazide]; Aspirin; and Lactose intolerance (gi)    Social History:  The patient  reports that  has never smoked. he has never used smokeless tobacco. He reports that he does not drink alcohol or use drugs.   Family History:  The patient's family history includes Diabetes in his maternal grandmother; Heart attack in his paternal grandmother; Hypertension in his father; Pancreatic cancer in his mother; Stroke in his father and maternal grandmother.    ROS: All other systems are reviewed and negative. Unless otherwise mentioned in H&P    PHYSICAL EXAM: VS:  BP 118/60   Pulse (!) 54   Wt 256 lb 6.4 oz (116.3 kg)   SpO2 94%   BMI 34.77 kg/m  , BMI Body mass index is 34.77 kg/m. GEN: Well nourished, well developed, in no acute distress  HEENT: normal  Neck: no JVD, carotid bruits, or masses HFWYOVZ:8/5 holosystolic murmur heard best at both the right and left sternal borders with radiation to the carotid RRR;no rubs, or gallops,no edema  Respiratory:  clear to auscultation bilaterally, normal work of breathing GI: soft, nontender, nondistended, + BS MS: no deformity or atrophy  Skin: warm and dry,  no rash Neuro:  Strength and sensation are intact Psych: euthymic mood, full affect  Recent Labs: 08/18/2017: TSH 2.101 08/19/2017: ALT 44 08/21/2017: BUN 12; Creatinine, Ser 1.02; Hemoglobin  8.1; Magnesium 2.4; Platelets 58; Potassium 4.0; Sodium 134    Lipid Panel    Component Value Date/Time   CHOL 111 08/05/2017 1150   TRIG 218.0 (H) 08/05/2017 1150   HDL 27.30 (L) 08/05/2017 1150   CHOLHDL 4 08/05/2017 1150   VLDL 43.6 (H) 08/05/2017 1150   LDLCALC 57 11/21/2016 1040   LDLDIRECT 51.0 08/05/2017 1150      Wt Readings from Last 3 Encounters:  08/25/17 256 lb 6.4 oz (116.3 kg)  08/17/17 245 lb 2.4 oz (111.2 kg)  08/12/17 252 lb (114.3 kg)      Other studies Reviewed: Echocardiogram 08-28-17 Left ventricle: The cavity size was mildly dilated. There was   mild concentric hypertrophy. Systolic function was mildly   reduced. The estimated ejection fraction was in the range of 45%   to 50%. Features are consistent with a pseudonormal left   ventricular filling pattern, with concomitant abnormal relaxation   and increased filling pressure (grade 2 diastolic dysfunction). - Aortic valve: There was severe stenosis. Mean gradient (S): 54 mm   Hg. Valve area (VTI): 0.76 cm^2. - Mitral valve: Mildly calcified annulus. There was mild   regurgitation. - Left atrium: The atrium was moderately dilated. - Right atrium: The atrium was mildly dilated. - Pulmonary arteries: Systolic pressure could not be accurately   estimated.  Impressions:  - Compared to most recent echo, the EF seems mildly reduced. Degree   of aortic stenosis seems to be stable.  ASSESSMENT AND PLAN:  1.  Severe aortic valve stenosis: Plan for TAVR on 09/09/2017. The patient continues symptomatic dyspnea on exertion and fluid retention. He is eager and ready to have procedure completed. Labs are point to be completed a week prior by oncology and therefore will not order any today.  2. Chronic systolic  dysfunction: As recent ejection fraction 45% per echo. The patient has significant fluid retention and was seen at [facility] after syncopal episode. The patient ACT had been ill for a few days before and febrile. This may have been related to mild dehydration. He had significant changes in his diuretics, cut down to a part of what he normally takes. This resulted in significant edema and greater than 10 pound weight gain since returning home.  He has subsequently returned to normal diuretic regimen of Lasix 120 mg by mouth twice a day with 40 mEq of potassium twice a day. We do not have records of recent [facility] discharge summary to ascertain reason for decreasing his Lasix so dramatically. I will continue him on current medication regimen concerning diuretics and potassium replacement.  He appears to be doing much better with higher doses of Lasix and returning to his normal regimen. Follow-up labs or can be completed in one week by oncology.  3. CAD:, status post coronary artery bypass grafting in 2001. Right and left heart catheterization was completed on 07/28/2017, this revealed severe native three-vessel coronary artery disease, with patent RIMA to PDA, LIMA to LAD, and sequential vein graft to ramus intermediate and first OM branch of the circumflex. This found have a chronic occlusion of the SVG to diagonal however the diagonal branch which collateralizes biapical LAD.  The patient will continue current medication regimen and secondary prevention.  4. Non--Hodgkin's lymphoma: Followed by Dr. Jonette Eva due to be seen in one week with labs and PET scan.   Current medicines are reviewed at length with the patient today.    Labs/ tests ordered today include: None  Phill Myron. West Pugh, ANP, AACC   08/25/2017 12:52 PM    [facility] [address] [place], [place] [address] Phone: [phone]; Fax: [phone]

## 2017-08-25 ENCOUNTER — Ambulatory Visit (INDEPENDENT_AMBULATORY_CARE_PROVIDER_SITE_OTHER): Payer: Medicare Other | Admitting: Adult Health

## 2017-08-25 ENCOUNTER — Encounter: Payer: Self-pay | Admitting: Adult Health

## 2017-08-25 VITALS — BP 118/60 | HR 54 | Wt 256.4 lb

## 2017-08-25 DIAGNOSIS — I43 Cardiomyopathy in diseases classified elsewhere: Secondary | ICD-10-CM

## 2017-08-25 DIAGNOSIS — I350 Nonrheumatic aortic (valve) stenosis: Secondary | ICD-10-CM | POA: Diagnosis not present

## 2017-08-25 DIAGNOSIS — I248 Other forms of acute ischemic heart disease: Secondary | ICD-10-CM

## 2017-08-25 DIAGNOSIS — C8330 Diffuse large B-cell lymphoma, unspecified site: Secondary | ICD-10-CM | POA: Diagnosis not present

## 2017-08-25 DIAGNOSIS — I25118 Atherosclerotic heart disease of native coronary artery with other forms of angina pectoris: Secondary | ICD-10-CM | POA: Diagnosis not present

## 2017-08-25 NOTE — Telephone Encounter (Signed)
Patient has an appointment today, 08/25/17, with Jory Sims, NP.

## 2017-08-25 NOTE — Patient Instructions (Addendum)
Medication Instructions:  NO CHANGES=Your physician recommends that you continue on your current medications as directed. Please refer to the Current Medication list given to you today.  If you need a refill on your cardiac medications before your next appointment, please call your pharmacy.  Labwork: HAVE LABS DONE IN ONCOLOGY  Special Instructions: KEEP TAVR APPT 09-09-17  Follow-Up: Your physician wants you to follow-up: AFTER TAVR.   Thank you for choosing [facility] at [facility]!!

## 2017-08-26 ENCOUNTER — Other Ambulatory Visit: Payer: Self-pay

## 2017-08-26 DIAGNOSIS — I350 Nonrheumatic aortic (valve) stenosis: Secondary | ICD-10-CM

## 2017-08-27 NOTE — Discharge Summary (Signed)
Richard [name] at [place] NAME: Richard [name]    MR#:  [id number]  DATE OF BIRTH:  Jan 23, 1941  DATE OF ADMISSION:  08/16/2017 ADMITTING PHYSICIAN: Amelia Jo, MD  DATE OF DISCHARGE: 08/21/2017  3:31 PM  PRIMARY CARE PHYSICIAN: [name], DO    ADMISSION DIAGNOSIS:  Exertional dyspnea [R06.09] External hemorrhoid [K64.4] Severe sepsis with septic shock (HCC) [A41.9, R65.21] Neutropenic fever ([place]) [D70.9, R50.81] Pancytopenia ([place]) [D61.818] Occult GI bleeding [R19.5] Demand ischemia of myocardium ([place]) [I24.8] Acute hypoxemic respiratory failure ([place]) [J96.01]  DISCHARGE DIAGNOSIS:  Active Problems:   GIB (gastrointestinal bleeding)   Acute hypoxemic respiratory failure (HCC)   Demand ischemia of myocardium (HCC)   Typical atrial flutter (HCC)   Acute on chronic diastolic heart failure ([place])   SECONDARY DIAGNOSIS:   Past Medical History:  Diagnosis Date  . Anemia   . Axillary adenopathy 02/25/2017  . Bradycardia    a. holter monitor has demonstrated HRs in 30s and [place]   . CAD (coronary artery disease)    a. s/p CABG 2001  . Chronic lower back pain   . Diffuse large B cell lymphoma ([place])   . Diverticulosis   . Esophageal stricture   . GERD (gastroesophageal reflux disease)   . History of gout   . HTN (hypertension)   . Mixed hyperlipidemia   . OSA on CPAP    with 2L O2 at night  . Pancytopenia ([place])    a. related to chemo therapy for B cell lymphoma  . Peptic stricture of esophagus   . Severe aortic stenosis   . Spinal stenosis   . Type II diabetes mellitus [facility])     HOSPITAL COURSE:   1acute on chronic symptomatic worsening anemia/pancytopenia Secondary to acute on subacute GI bleeding, exacerbated by chemotherapy for lymphoma Suspect due to hemorrhoidal disease   PPI daily, clear liquid diet upgraded to regular, transfuse 2 units packed red blood cells, 3 pack of platelets, CBC daily,  consult oncology and GI  strict I&O monitoring, neutropenic precautions, and continue close medical monitoring   GI and oncology suggested currently to monitor because of multiple medical issues.   Stool softner. Local anusol cream.  Spoke to surgery- on phone, as per them due to multiple issues, he is not a candidate for hemorrhoidal surgery.   GI have suggested conservative management already. Added lactulose.  2acute BRBPR/GIB- hemorrhoidal bleed. Plan of care as stated above  3acute elevated troponins/possible due to stress   avoid anticoagulants given acute blood loss anemia, antihypertensives on hold given relative hypotension, nitrates as needed, supplemental oxygen as needed, IV morphine as needed  Heart catheterization from January 2019: -Severe native three-vessel coronary artery disease with moderate distal left main stenosis, total occlusion of the LAD, total occlusion of the ramus intermedius, total occlusion of the first OM, and total occlusion of the RCA. -Status post aortocoronary bypass surgery with continued patency of the RIMA to PDA, LIMA to LAD, and sequential saphenous vein graft to ramus intermedius and first OM branches of the circumflex. -Chronic occlusion of the saphenous vein graft to diagonal, the diagonal branch is collateralized by the apical LAD -Severe calcific aortic stenosis with a mean transvalvular gradient of 51 mmHg  Appreciated help by cardiologist,  as mentioned above, continue monitoring currently and optimize the health. Advised to follow as planned outpatient for valvular surgery.  4 sepsis Continue sepsis protocol, empiric vancomycin/Zosyn with pharmacy to dose, follow-up on cultures  Could also be related to neutropenic fever in setting of symptomatic worsening pancytopenia/anemia-plan of care as stated above.   As per oncology, finish 7 days Abx course.  5acute kidney injury Baseline renal function normal Exacerbated by acute blood  loss anemia Avoid nephrotoxic agents, strict I&O monitoring, daily weights, BMP daily, hold Lasix given acute dehydrated state   Improving.  6acute dehydration Exacerbated by acute blood loss anemia and dehydration IV fluids for rehydration and blood transfusion as stated above  25morbid obesity Most likely secondary to excess calories Lifestyle modification recommended  8chronic obstructive sleep apnea Stable CPAP at bedtime  9chronic diabetes mellitus type 2 Hold metformin, on glimeperide, sliding scale insulin with Accu-Cheks per routine  10acute symptomatic hemorrhoidal disease, nonthrombosed Anusol twice daily for 5-day course    DISCHARGE CONDITIONS:   Stable.  CONSULTS OBTAINED:  Treatment Team:  Sindy Guadeloupe, MD Jettie Booze, MD Wellington Hampshire, MD Jonathon Bellows, MD  DRUG ALLERGIES:   Allergies  Allergen Reactions  . Benazepril Swelling    angioedema; he is not a candidate for any angiotensin receptor blockers because of this significant allergic reaction. Because of a history of documented adverse serious drug reaction;Medi Alert bracelet  is recommended  . Hctz [Hydrochlorothiazide] Anaphylaxis and Swelling    Tongue and lip swelling   . Aspirin Other (See Comments)    Gastritis, cant take 325 Mg aspirin   . Lactose Intolerance (Gi) Nausea And Vomiting    DISCHARGE MEDICATIONS:   Allergies as of 08/21/2017      Reactions   Benazepril Swelling   angioedema; he is not a candidate for any angiotensin receptor blockers because of this significant allergic reaction. Because of a history of documented adverse serious drug reaction;Medi Alert bracelet  is recommended   Hctz [hydrochlorothiazide] Anaphylaxis, Swelling   Tongue and lip swelling   Aspirin Other (See Comments)   Gastritis, cant take 325 Mg aspirin    Lactose Intolerance (gi) Nausea And Vomiting      Medication List    STOP taking these medications    diphenoxylate-atropine 2.5-0.025 MG tablet Commonly known as:  LOMOTIL   doxycycline 100 MG tablet Commonly known as:  VIBRA-TABS   metFORMIN 1000 MG tablet Commonly known as:  GLUCOPHAGE   metoprolol tartrate 25 MG tablet Commonly known as:  LOPRESSOR     TAKE these medications   allopurinol 300 MG tablet Commonly known as:  ZYLOPRIM Take 450 mg by mouth daily.   amLODipine 5 MG tablet Commonly known as:  NORVASC TAKE 2 TABLETS (10 MG TOTAL) BY MOUTH DAILY. What changed:  See the new instructions.   aspirin 81 MG tablet Take 81 mg by mouth daily.   atorvastatin 80 MG tablet Commonly known as:  LIPITOR TAKE ONE TABLET BY MOUTH AT BEDTIME   esomeprazole 40 MG capsule Commonly known as:  NEXIUM Take 1 capsule (40 mg total) by mouth daily.   ezetimibe 10 MG tablet Commonly known as:  ZETIA TAKE ONE TABLET BY MOUTH DAILY   famciclovir 500 MG tablet Commonly known as:  FAMVIR Take 1 tablet (500 mg total) by mouth daily.   fluticasone 50 MCG/ACT nasal spray Commonly known as:  FLONASE INHALE ONE SPRAY INTO EACH NOSTRIL TWICE DAILY.   freestyle lancets Use BID to check blood sugar.  DX E11.9   furosemide 40 MG tablet Commonly known as:  LASIX Take 1 tablet (40 mg total) by mouth daily. What changed:    how much to take  when to take this   glimepiride 2 MG tablet Commonly known as:  AMARYL TAKE 1 TABLET (2 MG TOTAL) BY MOUTH DAILY WITH BREAKFAST.   glucose blood test strip Commonly known as:  FREESTYLE LITE TEST BLOOD SUGAR bid   lidocaine-prilocaine cream Commonly known as:  EMLA Apply to affected area once What changed:    how much to take  how to take this  when to take this  reasons to take this  additional instructions   lipase/protease/amylase 36000 UNITS Cpep capsule Commonly known as:  CREON Take 2 capsules (72,000 Units total) by mouth 3 (three) times daily with meals. 1 capsule with snack.   NEOMYCIN-POLYMYXIN-HYDROCORTISONE 1  % Soln OTIC solution Commonly known as:  CORTISPORIN Apply 1-2 drops to toe BID after soaking   nitroGLYCERIN 0.4 MG SL tablet Commonly known as:  NITROSTAT Place 1 tablet (0.4 mg total) under the tongue every 5 (five) minutes as needed for chest pain.   ondansetron 8 MG tablet Commonly known as:  ZOFRAN Take 1 tablet (8 mg total) by mouth 2 (two) times daily as needed for refractory nausea / vomiting. Start on day 3 after cyclophosphamide chemotherapy.   polycarbophil 625 MG tablet Commonly known as:  FIBERCON Take 625 mg by mouth daily.   polyethylene glycol packet Commonly known as:  MIRALAX / GLYCOLAX Take 17 g by mouth daily.   potassium chloride SA 20 MEQ tablet Commonly known as:  KLOR-CON M20 Take 1 tablet (20 mEq total) by mouth daily. What changed:    how much to take  when to take this   pregabalin 75 MG capsule Commonly known as:  LYRICA Take 1 capsule (75 mg total) by mouth 3 (three) times daily.   prochlorperazine 10 MG tablet Commonly known as:  COMPAZINE Take 1 tablet (10 mg total) by mouth every 6 (six) hours as needed (Nausea or vomiting).   senna-docusate 8.6-50 MG tablet Commonly known as:  Senokot-S Take 1 tablet by mouth 2 (two) times daily.     ASK your doctor about these medications   amoxicillin-clavulanate 875-125 MG tablet Commonly known as:  AUGMENTIN Take 1 tablet by mouth 2 (two) times daily for 2 days. Ask about: Should I take this medication?   hydrocortisone 2.5 % rectal cream Commonly known as:  ANUSOL-HC Place rectally 2 (two) times daily for 5 days. Ask about: Should I take this medication?        DISCHARGE INSTRUCTIONS:   Follow with oncology clinic in 1 week.  If you experience worsening of your admission symptoms, develop shortness of breath, life threatening emergency, suicidal or homicidal thoughts you must seek medical attention immediately by calling 911 or calling your MD immediately  if symptoms less  severe.  You Must read complete instructions/literature along with all the possible adverse reactions/side effects for all the Medicines you take and that have been prescribed to you. Take any new Medicines after you have completely understood and accept all the possible adverse reactions/side effects.   Please note  You were cared for by a hospitalist during your hospital stay. If you have any questions about your discharge medications or the care you received while you were in the hospital after you are discharged, you can call the unit and asked to speak with the hospitalist on call if the hospitalist that took care of you is not available. Once you are discharged, your primary care physician will handle any further medical issues. Please note that NO REFILLS for  any discharge medications will be authorized once you are discharged, as it is imperative that you return to your primary care physician (or establish a relationship with a primary care physician if you do not have one) for your aftercare needs so that they can reassess your need for medications and monitor your lab values.    Today   CHIEF COMPLAINT:   Chief Complaint  Patient presents with  . Diarrhea    HISTORY OF PRESENT ILLNESS:  Richard [name]  is a 77 y.o. male with a known history of B-cell lymphoma status post eighth round of chemo-last one was 8 days ago on Friday, currently under evaluation for TAVR, presents with 2-day history of fevers, acute on chronic worsening shortness of breath/dyspnea on exertion, chills and sweating over the last 2 days, decreased energy, fatigue, generalized weakness, fever noted today of 101.3 in the emergency room, bright red blood per rectum for the last 2-4 weeks, in the emergency room patient was found to be tachycardic, tachypneic, hypotensive, EKG showing atrial flutter with 3-1 AV block with heart rate of 90, creatinine 1.4 with baseline normal, hemoglobin 7.8 down from 9.8, platelet count  32, white blood cell count 1.3, CT abdomen noted for bladder diverticulosis/atherosclerosis, chest x-ray noted for cardiomegaly, and discussion with ED attending-concern for sepsis due to unknown etiology, sepsis protocol initiated, patient evaluated the bedside with multiple family members present, acute clinical picture more consistent with acute worsening symptomatic anemia due to subacute bright red blood per rectum/GI bleeding, acute possible sepsis versus neutropenic fever.   VITAL SIGNS:  Blood pressure (!) 109/58, pulse 72, temperature 98.2 F (36.8 C), temperature source Oral, resp. rate 18, height 6' (1.829 m), weight 111.2 kg (245 lb 2.4 oz), SpO2 93 %.  I/O:  No intake or output data in the 24 hours ending 08/27/17 1521  PHYSICAL EXAMINATION:   GENERAL:77 y.o.-year-old patient lying in the bed with acute distress due to hemorrhoids..Morbid obesity EYES: Pupils equal, round, reactive to light and accommodation. No scleral icterus. Extraocular muscles intact.  HEENT: Head atraumatic, normocephalic. Oropharynx and nasopharynx clear.Dry mucous membranes NECK: Supple, no jugular venous distention. No thyroid enlargement, no tenderness. Poor skin turgor LUNGS: Normal breath sounds bilaterally, no wheezing, some crepitation. No use of accessory muscles of respiration.  CARDIOVASCULAR: S1, S2 normal. No murmurs, rubs, or gallops.  ABDOMEN: Soft, nontender, nondistended. Bowel sounds present. No organomegaly or mass.Nonthrombosed external hemorrhoid, tenderness to palpation EXTREMITIES: No pedal edema, cyanosis, or clubbing.  NEUROLOGIC: Cranial nerves II through XII are intact. Muscle strength 4-5/5 in all extremities. Sensation intact. Gait not checked.  PSYCHIATRIC: The patient is alert and oriented x 3.  SKIN: No obvious rash, lesion, or ulcer.    DATA REVIEW:   CBC Recent Labs  Lab 08/21/17 0627  WBC 3.2*  HGB 8.1*  HCT 24.8*  PLT 58*    Chemistries  Recent  Labs  Lab 08/21/17 0627  NA 134*  K 4.0  CL 103  CO2 23  GLUCOSE 111*  BUN 12  CREATININE 1.02  CALCIUM 7.3*  MG 2.4    Cardiac Enzymes No results for input(s): TROPONINI in the last 168 hours.  Microbiology Results  Results for orders placed or performed during the hospital encounter of 08/16/17  Blood Culture (routine x 2)     Status: None   Collection Time: 08/16/17  6:29 PM  Result Value Ref Range Status   Specimen Description BLOOD PORTA CATH  Final   Special Requests  Final    BOTTLES DRAWN AEROBIC AND ANAEROBIC Blood Culture results may not be optimal due to an excessive volume of blood received in culture bottles   Culture   Final    NO GROWTH 5 DAYS Performed at [facility], [address] [place], [place] [address]    Report Status 08/21/2017 FINAL  Final  Blood Culture (routine x 2)     Status: None   Collection Time: 08/16/17  6:48 PM  Result Value Ref Range Status   Specimen Description BLOOD RIGHT ANTECUBITAL  Final   Special Requests   Final    BOTTLES DRAWN AEROBIC AND ANAEROBIC Blood Culture results may not be optimal due to an inadequate volume of blood received in culture bottles   Culture   Final    NO GROWTH 5 DAYS Performed at [facility], [address] [place], [place] [address]    Report Status 08/21/2017 FINAL  Final  Urine culture     Status: None   Collection Time: 08/16/17  8:14 PM  Result Value Ref Range Status   Specimen Description   Final    URINE, CLEAN CATCH Performed at [facility], [address], [place], [place] [address]    Special Requests   Final    Normal Performed at [facility], [address], [place], [place] [address]    Culture   Final    NO GROWTH Performed at [facility], [place] [address], [place],  [address]    Report Status 08/18/2017 FINAL  Final  MRSA PCR Screening     Status: None   Collection Time: 08/17/17 12:30 AM  Result Value Ref Range  Status   MRSA by PCR NEGATIVE NEGATIVE Final    Comment:        The GeneXpert MRSA Assay (FDA approved for NASAL specimens only), is one component of a comprehensive MRSA colonization surveillance program. It is not intended to diagnose MRSA infection nor to guide or monitor treatment for MRSA infections. Performed at [facility], [address], [place],  [address]     RADIOLOGY:  No results found.  EKG:   Orders placed or performed during the hospital encounter of 08/16/17  . ED EKG 12-Lead  . ED EKG 12-Lead      Management plans discussed with the patient, family and they are in agreement.  CODE STATUS:  Code Status History    Date Active Date Inactive Code Status Order ID Comments User Context   08/16/2017 23:57 08/21/2017 18:41 Full Code [id number]  Gorden Harms, MD ED   07/24/2017 00:36 07/29/2017 17:58 Full Code [id number]  Doylene Canning, MD ED   03/16/2014 17:41 03/17/2014 16:26 Full Code [id number]  Burnell Blanks, MD Inpatient   03/15/2014 19:55 03/16/2014 17:41 Full Code [id number]  Consuelo Pandy, PA-C Inpatient   06/17/2013 16:49 06/21/2013 17:00 Full Code [id number]  Tobi Bastos, MD Inpatient      TOTAL TIME TAKING CARE OF THIS PATIENT:  35 minutes.    Vaughan Basta M.D on 08/27/2017 at 3:21 PM  Between 7am to 6pm - Pager - [phone]  After 6pm go to [email or web address] - password [email or web address] [facility]  Office  [phone]  CC: Primary care physician; [name], DO   Note: This dictation was prepared with Dragon dictation along with smaller phrase technology. Any transcriptional errors that result from this process are unintentional.

## 2017-08-28 ENCOUNTER — Encounter: Payer: Medicare Other | Admitting: Thoracic Surgery (Cardiothoracic Vascular Surgery)

## 2017-08-28 ENCOUNTER — Ambulatory Visit (INDEPENDENT_AMBULATORY_CARE_PROVIDER_SITE_OTHER): Payer: Medicare Other | Admitting: Podiatry

## 2017-08-28 ENCOUNTER — Encounter: Payer: Self-pay | Admitting: Podiatry

## 2017-08-28 DIAGNOSIS — L03032 Cellulitis of left toe: Secondary | ICD-10-CM

## 2017-08-28 NOTE — Progress Notes (Signed)
He presents today for follow-up of nail avulsion hallux left.  He is doing very well with this.  He states that he does not hurt at all.  Objective: Vital signs are stable he is alert and oriented times 3 days not been soaking this.  Area scabbed over the toe there is no signs of erythema cellulitis drainage or odor.  Assessment: Well-healing surgical toe hallux left.  Plan: Follow-up with me on an as-needed basis.  Watch for signs and symptoms of infection should any arise she will notify us immediately.

## 2017-09-01 ENCOUNTER — Telehealth: Payer: Self-pay | Admitting: *Deleted

## 2017-09-01 ENCOUNTER — Encounter (HOSPITAL_COMMUNITY)
Admission: RE | Admit: 2017-09-01 | Discharge: 2017-09-01 | Disposition: A | Discharge status: Home / Self Care | Payer: Medicare Other | Source: Ambulatory Visit | Attending: Hematology & Oncology | Admitting: Hematology & Oncology

## 2017-09-01 DIAGNOSIS — C8332 Diffuse large B-cell lymphoma, intrathoracic lymph nodes: Secondary | ICD-10-CM | POA: Insufficient documentation

## 2017-09-01 DIAGNOSIS — C8330 Diffuse large B-cell lymphoma, unspecified site: Secondary | ICD-10-CM | POA: Diagnosis not present

## 2017-09-01 LAB — GLUCOSE, CAPILLARY: Glucose-Capillary: 109 mg/dL — ABNORMAL HIGH (ref 65–99)

## 2017-09-01 MED ORDER — FLUDEOXYGLUCOSE F - 18 (FDG) INJECTION
12.6000 | Freq: Once | INTRAVENOUS | Status: AC | PRN
  Administered 2017-09-01: 12.6 via INTRAVENOUS

## 2017-09-01 NOTE — Telephone Encounter (Addendum)
Patient is aware of results  ----- Message from [name], MD sent at 09/01/2017 12:26 PM EST ----- Call - NO active lymphoma!!!  Laurey Arrow

## 2017-09-04 ENCOUNTER — Ambulatory Visit (INDEPENDENT_AMBULATORY_CARE_PROVIDER_SITE_OTHER): Payer: Medicare Other | Admitting: Pulmonary Disease

## 2017-09-04 ENCOUNTER — Encounter: Payer: Self-pay | Admitting: Pulmonary Disease

## 2017-09-04 DIAGNOSIS — I248 Other forms of acute ischemic heart disease: Secondary | ICD-10-CM

## 2017-09-04 DIAGNOSIS — I5033 Acute on chronic diastolic (congestive) heart failure: Secondary | ICD-10-CM | POA: Diagnosis not present

## 2017-09-04 DIAGNOSIS — G4733 Obstructive sleep apnea (adult) (pediatric): Secondary | ICD-10-CM

## 2017-09-04 NOTE — Assessment & Plan Note (Signed)
Continue Lasix per cardiology

## 2017-09-04 NOTE — Assessment & Plan Note (Signed)
Not sure what is change in the last 2 weeks Residual AHI is high recheck download in spite of good compliance with the CPAP, perhaps this is a related to large leak Recheck download in 2-3 months  Weight loss encouraged, compliance with goal of at least 4-6 hrs every night is the expectation. Advised against medications with sedative side effects Cautioned against driving when sleepy - understanding that sleepiness will vary on a day to day basis

## 2017-09-04 NOTE — Progress Notes (Signed)
   Subjective:    Patient ID: Richard [name], male    DOB: 1940/09/03, 77 y.o.   MRN: [id number]  HPI  77 year old male For FU of obstructive sleep apnea.  He got a new CPAP machine in 10/2015  He was diagnosed with diffuse large B-cell lymphoma and underwent chemotherapy (ennever), course was complicated by severe pancytopenia requiring hospital admission. He is also been diagnosed with severe aortic stenosis and TAVR is planned once counts improve.  He also developed increased pedal edema because his Lasix dose was decreased and after increasing back to his usual dose of 3 tablets twice daily, edema seems to have resolved  He continues to feel well rested and is very compliant with his CPAP machine, complains of a leak around the nasal mask, no problems with pressure. Download was reviewed and shows excellent compliance on CPAP of 15 cm with moderate leak.  AHI is increased over the last 2 weeks and prior to that appears to be well controlled   Significant tests/ events 2008 PSG RDI 48/h ONO on CPAP /RA 08/2014 >> no desatn - dc O2   Review of Systems Patient denies significant dyspnea,cough, hemoptysis,  chest pain, palpitations, pedal edema, orthopnea, paroxysmal nocturnal dyspnea, lightheadedness, nausea, vomiting, abdominal or  leg pains      Objective:   Physical Exam   Gen. Pleasant, obese, in no distress ENT - no lesions, no post nasal drip Neck: No JVD, no thyromegaly, no carotid bruits Lungs: no use of accessory muscles, no dullness to percussion, decreased without rales or rhonchi  Cardiovascular: Rhythm regular, heart sounds  normal, no murmurs or gallops, no peripheral edema Musculoskeletal: No deformities, no cyanosis or clubbing , no tremors        Assessment & Plan:

## 2017-09-04 NOTE — Patient Instructions (Signed)
Recheck CPAP report in 3 months. Good luck with your valve procedure

## 2017-09-04 NOTE — Pre-Procedure Instructions (Signed)
Malike Foglio [facility]  09/04/2017      [facility] [place], [place] [address] Phone: [phone] Fax: [phone]  [facility] [place], [place] [address] [place] [address] Phone: [phone] Fax: [phone]  [facility] [place], [place] - [place] [address] [place] [address] Phone: [phone] Fax: [phone]    Your procedure is scheduled on Tuesday March 12.  Report to [facility] Admitting at 5:30 A.M.  Call this number if you have problems the morning of surgery:  [phone]   Remember:  Do not eat food or drink liquids after midnight.  Take these medicines the morning of surgery with A SIP OF WATER: flonase if needed,   DO NOT TAKE Metformin (glucophage) the day of surgery or the day before surgery.   DO NOT TAKE glimepiride (Amaryl) the day of surgery.   7 days prior to surgery STOP taking any Aleve, Naproxen, Ibuprofen, Motrin, Advil, Goody's, BC's, all herbal medications, fish oil, and all vitamins     How to Manage Your Diabetes Before and After Surgery  Why is it important to control my blood sugar before and after surgery? . Improving blood sugar levels before and after surgery helps healing and can limit problems. . A way of improving blood sugar control is eating a healthy diet by: o  Eating less sugar and carbohydrates o  Increasing activity/exercise o  Talking with your doctor about reaching your blood sugar goals . High blood sugars (greater than 180 mg/dL) can raise your risk of infections and slow your recovery, so you will need to focus on controlling your diabetes during the weeks before surgery. . Make sure that the doctor who takes care of your diabetes knows about your planned surgery including the date and location.  How do I manage my blood sugar before surgery? . Check your blood sugar at  least 4 times a day, starting 2 days before surgery, to make sure that the level is not too high or low. o Check your blood sugar the morning of your surgery when you wake up and every 2 hours until you get to the Short Stay unit. . If your blood sugar is less than 70 mg/dL, you will need to treat for low blood sugar: o Do not take insulin. o Treat a low blood sugar (less than 70 mg/dL) with  cup of clear juice (cranberry or apple), 4 glucose tablets, OR glucose gel. Recheck blood sugar in 15 minutes after treatment (to make sure it is greater than 70 mg/dL). If your blood sugar is not greater than 70 mg/dL on recheck, call [phone] o  for further instructions. . Report your blood sugar to the short stay nurse when you get to Short Stay.  . If you are admitted to the hospital after surgery: o Your blood sugar will be checked by the staff and you will probably be given insulin after surgery (instead of oral diabetes medicines) to make sure you have good blood sugar levels. o The goal for blood sugar control after surgery is 80-180 mg/dL.            Do not wear jewelry, make-up or nail polish.  Do not wear lotions, powders, or perfumes, or deodorant.  Do not shave 48 hours prior to surgery.  Men may shave face and neck.  Do not bring valuables  to the hospital.  [facility] is not responsible for any belongings or valuables.  Contacts, dentures or bridgework may not be worn into surgery.  Leave your suitcase in the car.  After surgery it may be brought to your room.  For patients admitted to the hospital, discharge time will be determined by your treatment team.  Patients discharged the day of surgery will not be allowed to drive home.   Special instructions:    [place]- Preparing For Surgery  Before surgery, you can play an important role. Because skin is not sterile, your skin needs to be as free of germs as possible. You can reduce the number of germs on your skin by  washing with CHG (chlorahexidine gluconate) Soap before surgery.  CHG is an antiseptic cleaner which kills germs and bonds with the skin to continue killing germs even after washing.  Please do not use if you have an allergy to CHG or antibacterial soaps. If your skin becomes reddened/irritated stop using the CHG.  Do not shave (including legs and underarms) for at least 48 hours prior to first CHG shower. It is OK to shave your face.  Please follow these instructions carefully.   1. Shower the NIGHT BEFORE SURGERY and the MORNING OF SURGERY with CHG.   2. If you chose to wash your hair, wash your hair first as usual with your normal shampoo.  3. After you shampoo, rinse your hair and body thoroughly to remove the shampoo.  4. Use CHG as you would any other liquid soap. You can apply CHG directly to the skin and wash gently with a scrungie or a clean washcloth.   5. Apply the CHG Soap to your body ONLY FROM THE NECK DOWN.  Do not use on open wounds or open sores. Avoid contact with your eyes, ears, mouth and genitals (private parts). Wash Face and genitals (private parts)  with your normal soap.  6. Wash thoroughly, paying special attention to the area where your surgery will be performed.  7. Thoroughly rinse your body with warm water from the neck down.  8. DO NOT shower/wash with your normal soap after using and rinsing off the CHG Soap.  9. Pat yourself dry with a CLEAN TOWEL.  10. Wear CLEAN PAJAMAS to bed the night before surgery, wear comfortable clothes the morning of surgery  11. Place CLEAN SHEETS on your bed the night of your first shower and DO NOT SLEEP WITH PETS.    Day of Surgery: Do not apply any deodorants/lotions. Please wear clean clothes to the hospital/surgery center.      Please read over the following fact sheets that you were given. Coughing and Deep Breathing, MRSA Information and Surgical Site Infection Prevention

## 2017-09-05 ENCOUNTER — Inpatient Hospital Stay: Payer: Medicare Other

## 2017-09-05 ENCOUNTER — Inpatient Hospital Stay: Payer: Medicare Other | Attending: Hematology & Oncology | Admitting: Hematology & Oncology

## 2017-09-05 ENCOUNTER — Encounter (HOSPITAL_COMMUNITY)
Admission: RE | Admit: 2017-09-05 | Discharge: 2017-09-05 | Disposition: A | Discharge status: Home / Self Care | Payer: Medicare Other | Source: Ambulatory Visit | Attending: Cardiovascular Disease | Admitting: Cardiovascular Disease

## 2017-09-05 ENCOUNTER — Other Ambulatory Visit: Payer: Self-pay

## 2017-09-05 ENCOUNTER — Encounter (HOSPITAL_COMMUNITY): Payer: Self-pay

## 2017-09-05 ENCOUNTER — Ambulatory Visit (HOSPITAL_COMMUNITY)
Admission: RE | Admit: 2017-09-05 | Discharge: 2017-09-05 | Disposition: A | Discharge status: Home / Self Care | Payer: Medicare Other | Source: Ambulatory Visit | Attending: Cardiovascular Disease | Admitting: Cardiovascular Disease

## 2017-09-05 VITALS — BP 110/47 | HR 51 | Temp 98.0°F | Resp 20 | Wt 248.0 lb

## 2017-09-05 DIAGNOSIS — Z7982 Long term (current) use of aspirin: Secondary | ICD-10-CM | POA: Diagnosis not present

## 2017-09-05 DIAGNOSIS — M109 Gout, unspecified: Secondary | ICD-10-CM | POA: Diagnosis not present

## 2017-09-05 DIAGNOSIS — Z01818 Encounter for other preprocedural examination: Secondary | ICD-10-CM | POA: Diagnosis not present

## 2017-09-05 DIAGNOSIS — Z95828 Presence of other vascular implants and grafts: Secondary | ICD-10-CM

## 2017-09-05 DIAGNOSIS — C8332 Diffuse large B-cell lymphoma, intrathoracic lymph nodes: Secondary | ICD-10-CM | POA: Diagnosis not present

## 2017-09-05 DIAGNOSIS — E611 Iron deficiency: Secondary | ICD-10-CM | POA: Diagnosis not present

## 2017-09-05 DIAGNOSIS — Z01810 Encounter for preprocedural cardiovascular examination: Secondary | ICD-10-CM | POA: Diagnosis not present

## 2017-09-05 DIAGNOSIS — I10 Essential (primary) hypertension: Secondary | ICD-10-CM | POA: Insufficient documentation

## 2017-09-05 DIAGNOSIS — E785 Hyperlipidemia, unspecified: Secondary | ICD-10-CM | POA: Diagnosis not present

## 2017-09-05 DIAGNOSIS — K219 Gastro-esophageal reflux disease without esophagitis: Secondary | ICD-10-CM | POA: Diagnosis not present

## 2017-09-05 DIAGNOSIS — I350 Nonrheumatic aortic (valve) stenosis: Secondary | ICD-10-CM | POA: Diagnosis not present

## 2017-09-05 DIAGNOSIS — C8590 Non-Hodgkin lymphoma, unspecified, unspecified site: Secondary | ICD-10-CM | POA: Insufficient documentation

## 2017-09-05 DIAGNOSIS — I248 Other forms of acute ischemic heart disease: Secondary | ICD-10-CM

## 2017-09-05 DIAGNOSIS — D510 Vitamin B12 deficiency anemia due to intrinsic factor deficiency: Secondary | ICD-10-CM

## 2017-09-05 DIAGNOSIS — R001 Bradycardia, unspecified: Secondary | ICD-10-CM | POA: Insufficient documentation

## 2017-09-05 DIAGNOSIS — G4733 Obstructive sleep apnea (adult) (pediatric): Secondary | ICD-10-CM | POA: Diagnosis not present

## 2017-09-05 DIAGNOSIS — Z79899 Other long term (current) drug therapy: Secondary | ICD-10-CM | POA: Diagnosis not present

## 2017-09-05 DIAGNOSIS — Z7984 Long term (current) use of oral hypoglycemic drugs: Secondary | ICD-10-CM | POA: Diagnosis not present

## 2017-09-05 DIAGNOSIS — J90 Pleural effusion, not elsewhere classified: Secondary | ICD-10-CM | POA: Diagnosis not present

## 2017-09-05 DIAGNOSIS — Z950 Presence of cardiac pacemaker: Secondary | ICD-10-CM | POA: Insufficient documentation

## 2017-09-05 DIAGNOSIS — E119 Type 2 diabetes mellitus without complications: Secondary | ICD-10-CM | POA: Diagnosis not present

## 2017-09-05 DIAGNOSIS — I2510 Atherosclerotic heart disease of native coronary artery without angina pectoris: Secondary | ICD-10-CM | POA: Insufficient documentation

## 2017-09-05 DIAGNOSIS — D500 Iron deficiency anemia secondary to blood loss (chronic): Secondary | ICD-10-CM

## 2017-09-05 DIAGNOSIS — D509 Iron deficiency anemia, unspecified: Secondary | ICD-10-CM | POA: Diagnosis not present

## 2017-09-05 LAB — CBC WITH DIFFERENTIAL (CANCER CENTER ONLY)
BASOS PCT: 3 %
Basophils Absolute: 0.1 10*3/uL (ref 0.0–0.1)
Eosinophils Absolute: 0.1 10*3/uL (ref 0.0–0.5)
Eosinophils Relative: 2 %
HEMATOCRIT: 30.8 % — AB (ref 38.7–49.9)
HEMOGLOBIN: 9.4 g/dL — AB (ref 13.0–17.1)
LYMPHS ABS: 0.6 10*3/uL — AB (ref 0.9–3.3)
LYMPHS PCT: 17 %
MCH: 28.8 pg (ref 28.0–33.4)
MCHC: 30.5 g/dL — AB (ref 32.0–35.9)
MCV: 94.5 fL (ref 82.0–98.0)
MONOS PCT: 14 %
Monocytes Absolute: 0.5 10*3/uL (ref 0.1–0.9)
NEUTROS ABS: 2.3 10*3/uL (ref 1.5–6.5)
Neutrophils Relative %: 64 %
Platelet Count: 94 10*3/uL — ABNORMAL LOW (ref 145–400)
RBC: 3.26 MIL/uL — ABNORMAL LOW (ref 4.20–5.70)
RDW: 18.7 % — AB (ref 11.1–15.7)
WBC Count: 3.6 10*3/uL — ABNORMAL LOW (ref 4.0–10.0)

## 2017-09-05 LAB — CMP (CANCER CENTER ONLY)
ALK PHOS: 106 U/L — AB (ref 26–84)
ALT: 19 U/L (ref 10–47)
ANION GAP: 8 (ref 5–15)
AST: 24 U/L (ref 11–38)
Albumin: 3.5 g/dL (ref 3.5–5.0)
BILIRUBIN TOTAL: 1 mg/dL (ref 0.2–1.6)
BUN: 17 mg/dL (ref 7–22)
CHLORIDE: 103 mmol/L (ref 98–108)
CO2: 31 mmol/L (ref 18–33)
Calcium: 9.5 mg/dL (ref 8.0–10.3)
Creatinine: 1 mg/dL (ref 0.60–1.20)
Glucose, Bld: 103 mg/dL (ref 73–118)
POTASSIUM: 3.6 mmol/L (ref 3.3–4.7)
Sodium: 142 mmol/L (ref 128–145)
Total Protein: 6.4 g/dL (ref 6.4–8.1)

## 2017-09-05 LAB — BRAIN NATRIURETIC PEPTIDE: B NATRIURETIC PEPTIDE 5: 1104.8 pg/mL — AB (ref 0.0–100.0)

## 2017-09-05 LAB — URINALYSIS, ROUTINE W REFLEX MICROSCOPIC
BILIRUBIN URINE: NEGATIVE
GLUCOSE, UA: NEGATIVE mg/dL
Hgb urine dipstick: NEGATIVE
KETONES UR: NEGATIVE mg/dL
LEUKOCYTES UA: NEGATIVE
NITRITE: NEGATIVE
PH: 5 (ref 5.0–8.0)
Protein, ur: NEGATIVE mg/dL
SPECIFIC GRAVITY, URINE: 1.01 (ref 1.005–1.030)

## 2017-09-05 LAB — GLUCOSE, CAPILLARY
GLUCOSE-CAPILLARY: 67 mg/dL (ref 65–99)
Glucose-Capillary: 67 mg/dL (ref 65–99)

## 2017-09-05 LAB — BLOOD GAS, ARTERIAL
ACID-BASE EXCESS: 2.3 mmol/L — AB (ref 0.0–2.0)
BICARBONATE: 25.6 mmol/L (ref 20.0–28.0)
Drawn by: 421801
FIO2: 21
O2 SAT: 98.4 %
Patient temperature: 98.6
pCO2 arterial: 34.7 mmHg (ref 32.0–48.0)
pH, Arterial: 7.481 — ABNORMAL HIGH (ref 7.350–7.450)
pO2, Arterial: 102 mmHg (ref 83.0–108.0)

## 2017-09-05 LAB — PROTIME-INR
INR: 1.24
Prothrombin Time: 15.5 seconds — ABNORMAL HIGH (ref 11.4–15.2)

## 2017-09-05 LAB — LACTATE DEHYDROGENASE: LDH: 263 U/L — AB (ref 125–245)

## 2017-09-05 LAB — APTT: aPTT: 37 seconds — ABNORMAL HIGH (ref 24–36)

## 2017-09-05 LAB — SURGICAL PCR SCREEN
MRSA, PCR: NEGATIVE
Staphylococcus aureus: POSITIVE — AB

## 2017-09-05 MED ORDER — SODIUM CHLORIDE 0.9% FLUSH
10.0000 mL | INTRAVENOUS | Status: DC | PRN
  Administered 2017-09-05: 10 mL via INTRAVENOUS
  Filled 2017-09-05: qty 10

## 2017-09-05 MED ORDER — HEPARIN SOD (PORK) LOCK FLUSH 100 UNIT/ML IV SOLN
500.0000 [IU] | Freq: Once | INTRAVENOUS | Status: AC
  Administered 2017-09-05: 500 [IU] via INTRAVENOUS
  Filled 2017-09-05: qty 5

## 2017-09-05 NOTE — Progress Notes (Signed)
PCP - Dr. Archie Endo Cardiologist - Dr. Percival Spanish Pulmonologist - Dr. Elsworth Soho Oncologist - Dr. Marin Olp  Chest x-ray - 09/05/2017 EKG - 09/05/2017 Stress Test - 2009 ECHO - 08/18/2017 Cardiac Cath - 07/28/2017  Sleep Study - 2017, in EPIC CPAP - yes, pressure set at 15  Fasting Blood Sugar - 100's Checks Blood Sugar 1 time a day  Blood Thinner Instructions: n/a Aspirin Instructions: follow instructions given by Dr. Camillia Herter office  Anesthesia review: yes, cardiac history  Patient denies shortness of breath, fever, cough and chest pain at PAT appointment   Patient verbalized understanding of instructions that were given to them at the PAT appointment. Patient was also instructed that they will need to review over the PAT instructions again at home before surgery.

## 2017-09-05 NOTE — Progress Notes (Signed)
Hematology and Oncology Follow Up Visit  Richard [name] [id number] Dec 02, 1940 77 y.o. 09/05/2017   Principle Diagnosis:  Diffuse large cell non-Hodgkin's lymphoma (IPI = 3) - NOT "double hit" Pernicious anemia  Current Therapy:   R-CHOP - s/p cycle#8 Vitamin B12 1 mg IM every month Xgeva 120 mg subcu q 3 months - next dose in 10/2017   Interim History:  Richard [name] is here today for follow-up.  He really had a tough time with his last cycle of chemotherapy.  He ended up at [facility].  He was neutropenic.  He was febrile.  I think he did get some blood.  He goes for his valve surgery next week.  This can be done percutaneously.  This is incredibly impressive.  Hopefully this will make him feel a whole lot better.  His iron studies back in February showed low iron levels.  His ferritin was 1400 but his iron saturation was only 6%.  A lot of the ferritin elevation is because of the lymphoma that he had in his bones.  We did do a PET scan on him.  The PET scan did not show any active lymphoma.  We will continue him on his Xgeva.  I think this is all that we have to do.  He has had some leg swelling.  Again, I think this is probably from his aortic valve with the severe stenosis.  Overall, his performance status is ECOG 1.   Medications:  Allergies as of 09/05/2017      Reactions   Benazepril Swelling   angioedema; he is not a candidate for any angiotensin receptor blockers because of this significant allergic reaction. Because of a history of documented adverse serious drug reaction;Medi Alert bracelet  is recommended   Hctz [hydrochlorothiazide] Anaphylaxis, Swelling   Tongue and lip swelling   Aspirin Other (See Comments)   Gastritis, cant take 325 Mg aspirin    Lactose Intolerance (gi) Nausea And Vomiting      Medication List        Accurate as of 09/05/17 12:15 PM. Always use your most recent med list.          allopurinol 300 MG tablet Commonly known as:   ZYLOPRIM Take 450 mg by mouth daily.   amLODipine 5 MG tablet Commonly known as:  NORVASC TAKE 2 TABLETS (10 MG TOTAL) BY MOUTH DAILY.   aspirin 81 MG tablet Take 81 mg by mouth daily.   atorvastatin 80 MG tablet Commonly known as:  LIPITOR TAKE ONE TABLET BY MOUTH AT BEDTIME   esomeprazole 40 MG capsule Commonly known as:  NEXIUM Take 1 capsule (40 mg total) by mouth daily.   ezetimibe 10 MG tablet Commonly known as:  ZETIA TAKE ONE TABLET BY MOUTH DAILY   famciclovir 500 MG tablet Commonly known as:  FAMVIR Take 1 tablet (500 mg total) by mouth daily.   fluticasone 50 MCG/ACT nasal spray Commonly known as:  FLONASE INHALE ONE SPRAY INTO EACH NOSTRIL TWICE DAILY.   freestyle lancets Use BID to check blood sugar.  DX E11.9   furosemide 40 MG tablet Commonly known as:  LASIX Take 120 mg by mouth 2 (two) times daily.   glimepiride 2 MG tablet Commonly known as:  AMARYL TAKE 1 TABLET (2 MG TOTAL) BY MOUTH DAILY WITH BREAKFAST.   glucose blood test strip Commonly known as:  FREESTYLE LITE TEST BLOOD SUGAR bid   lidocaine-prilocaine cream Commonly known as:  EMLA Apply to affected area  once   lipase/protease/amylase 36000 UNITS Cpep capsule Commonly known as:  CREON Take 2 capsules (72,000 Units total) by mouth 3 (three) times daily with meals. 1 capsule with snack.   LORazepam 0.5 MG tablet Commonly known as:  ATIVAN Take 0.5 mg by mouth every 6 (six) hours as needed for anxiety.   METFORMIN HCL PO Take 40 mg by mouth 2 (two) times daily with a meal. Pt takes 1.5 tablets in the AM and 1 tablet in the PM   NEOMYCIN-POLYMYXIN-HYDROCORTISONE 1 % Soln OTIC solution Commonly known as:  CORTISPORIN Apply 1-2 drops to toe BID after soaking   nitroGLYCERIN 0.4 MG SL tablet Commonly known as:  NITROSTAT Place 1 tablet (0.4 mg total) under the tongue every 5 (five) minutes as needed for chest pain.   ondansetron 8 MG tablet Commonly known as:  ZOFRAN Take 1  tablet (8 mg total) by mouth 2 (two) times daily as needed for refractory nausea / vomiting. Start on day 3 after cyclophosphamide chemotherapy.   polycarbophil 625 MG tablet Commonly known as:  FIBERCON Take 625 mg by mouth daily.   polyethylene glycol packet Commonly known as:  MIRALAX / GLYCOLAX Take 17 g by mouth daily.   potassium chloride SA 20 MEQ tablet Commonly known as:  K-DUR,KLOR-CON Take 40 mEq by mouth 2 (two) times daily.   pregabalin 75 MG capsule Commonly known as:  LYRICA Take 1 capsule (75 mg total) by mouth 3 (three) times daily.   prochlorperazine 10 MG tablet Commonly known as:  COMPAZINE Take 1 tablet (10 mg total) by mouth every 6 (six) hours as needed (Nausea or vomiting).   senna-docusate 8.6-50 MG tablet Commonly known as:  Senokot-S Take 1 tablet by mouth 2 (two) times daily.       Allergies:  Allergies  Allergen Reactions  . Benazepril Swelling    angioedema; he is not a candidate for any angiotensin receptor blockers because of this significant allergic reaction. Because of a history of documented adverse serious drug reaction;Medi Alert bracelet  is recommended  . Hctz [Hydrochlorothiazide] Anaphylaxis and Swelling    Tongue and lip swelling   . Aspirin Other (See Comments)    Gastritis, cant take 325 Mg aspirin   . Lactose Intolerance (Gi) Nausea And Vomiting    Past Medical History, Surgical history, Social history, and Family History were reviewed and updated.  Review of Systems: Review of Systems  Constitutional: Negative.   HENT: Positive for congestion.   Eyes: Negative.   Respiratory: Negative.   Cardiovascular: Negative.   Gastrointestinal: Negative.   Genitourinary: Negative.   Musculoskeletal: Negative.   Skin: Negative.   Neurological: Negative.   Endo/Heme/Allergies: Negative.      Physical Exam:  weight is 248 lb (112.5 kg). His oral temperature is 98 F (36.7 C). His blood pressure is 110/47 (abnormal) and his  pulse is 51 (abnormal). His respiration is 20 and oxygen saturation is 98%.   Wt Readings from Last 3 Encounters:  09/05/17 248 lb (112.5 kg)  09/04/17 254 lb (115.2 kg)  08/25/17 256 lb 6.4 oz (116.3 kg)    Physical Exam  Constitutional: He is oriented to person, place, and time.  HENT:  Head: Normocephalic and atraumatic.  Mouth/Throat: Oropharynx is clear and moist.  Eyes: EOM are normal. Pupils are equal, round, and reactive to light.  Neck: Normal range of motion.  Cardiovascular: Normal rate, regular rhythm and normal heart sounds.  Pulmonary/Chest: Effort normal and breath sounds normal.  Abdominal: Soft. Bowel sounds are normal.  Musculoskeletal: Normal range of motion. He exhibits no edema, tenderness or deformity.  Lymphadenopathy:    He has no cervical adenopathy.  Neurological: He is alert and oriented to person, place, and time.  Skin: Skin is warm and dry. No rash noted. No erythema.  Psychiatric: He has a normal mood and affect. His behavior is normal. Judgment and thought content normal.  Vitals reviewed.   Lab Results  Component Value Date   WBC 3.6 (L) 09/05/2017   HGB 8.1 (L) 08/21/2017   HCT 30.8 (L) 09/05/2017   MCV 94.5 09/05/2017   PLT 94 (L) 09/05/2017   Lab Results  Component Value Date   FERRITIN 1,421 (H) 08/18/2017   IRON 11 (L) 08/18/2017   TIBC 199 (L) 08/18/2017   UIBC 188 08/18/2017   IRONPCTSAT 6 (L) 08/18/2017   Lab Results  Component Value Date   RETICCTPCT 1.1 11/22/2011   RBC 3.26 (L) 09/05/2017   RETICCTABS 52.1 11/22/2011   No results found for: Nils Pyle [facility] Lab Results  Component Value Date   IGA 159 03/09/2012   No results found for: Odetta Pink, SPEI   Chemistry      Component Value Date/Time   NA 142 09/05/2017 1110   NA 137 08/01/2017 0805   NA 144 06/27/2017 0857   K 3.6 09/05/2017 1110   K 3.9 06/27/2017 0857   CL 103 09/05/2017  1110   CL 103 06/27/2017 0857   CO2 31 09/05/2017 1110   CO2 26 06/27/2017 0857   BUN 17 09/05/2017 1110   BUN 12 08/01/2017 0805   BUN 12 06/27/2017 0857   CREATININE 1.00 09/05/2017 1110   CREATININE 1.0 06/27/2017 0857      Component Value Date/Time   CALCIUM 9.5 09/05/2017 1110   CALCIUM 9.2 06/27/2017 0857   ALKPHOS 106 (H) 09/05/2017 1110   ALKPHOS 128 (H) 06/27/2017 0857   AST 24 09/05/2017 1110   ALT 19 09/05/2017 1110   ALT 24 06/27/2017 0857   BILITOT 1.0 09/05/2017 1110      Impression and Plan: Richard [name] is a very pleasant 77 yo caucasian gentleman with diffuse large B-cell lymphoma (not double hit lymphoma).   At this point, we will plan for surveillance.  I know that his lymphoma can always come back.  However, he got into remission pretty quickly.  For right now, we will plan to get him back in May.  Hopefully he will be feeling a whole lot better from his valve repair.  I do not see any problems with him having valve repair.  I think his blood counts are adequate for recovery.  We will go ahead and give him iron today.  I think this will help him with his surgery.   [name], MD 3/8/201912:15 PM

## 2017-09-06 LAB — HEMOGLOBIN A1C
Hgb A1c MFr Bld: 5.4 % (ref 4.8–5.6)
Mean Plasma Glucose: 108 mg/dL

## 2017-09-08 ENCOUNTER — Ambulatory Visit: Payer: Medicare Other | Admitting: Pulmonary Disease

## 2017-09-08 ENCOUNTER — Inpatient Hospital Stay: Payer: Medicare Other

## 2017-09-08 VITALS — BP 104/49 | HR 53 | Temp 98.3°F | Resp 18

## 2017-09-08 DIAGNOSIS — D508 Other iron deficiency anemias: Secondary | ICD-10-CM

## 2017-09-08 DIAGNOSIS — D519 Vitamin B12 deficiency anemia, unspecified: Secondary | ICD-10-CM

## 2017-09-08 MED ORDER — SODIUM CHLORIDE 0.9 % IV SOLN
INTRAVENOUS | Status: DC
  Filled 2017-09-08: qty 1

## 2017-09-08 MED ORDER — HEPARIN SODIUM (PORCINE) 1000 UNIT/ML IJ SOLN
INTRAMUSCULAR | Status: DC
  Filled 2017-09-08: qty 30

## 2017-09-08 MED ORDER — SODIUM CHLORIDE 0.9 % IV SOLN
510.0000 mg | Freq: Once | INTRAVENOUS | Status: AC
  Administered 2017-09-08: 510 mg via INTRAVENOUS
  Filled 2017-09-08: qty 17

## 2017-09-08 MED ORDER — HEPARIN SOD (PORK) LOCK FLUSH 100 UNIT/ML IV SOLN
500.0000 [IU] | Freq: Once | INTRAVENOUS | Status: DC | PRN
  Filled 2017-09-08: qty 5

## 2017-09-08 MED ORDER — MAGNESIUM SULFATE 50 % IJ SOLN
40.0000 meq | INTRAMUSCULAR | Status: DC
  Filled 2017-09-08: qty 9.85

## 2017-09-08 MED ORDER — SODIUM CHLORIDE 0.9 % IV SOLN
1.5000 g | INTRAVENOUS | Status: AC
  Administered 2017-09-09: 1.5 g via INTRAVENOUS
  Filled 2017-09-08: qty 1.5

## 2017-09-08 MED ORDER — DEXMEDETOMIDINE HCL IN NACL 400 MCG/100ML IV SOLN
0.1000 ug/kg/h | INTRAVENOUS | Status: DC
  Filled 2017-09-08: qty 100

## 2017-09-08 MED ORDER — VANCOMYCIN HCL 10 G IV SOLR
1500.0000 mg | INTRAVENOUS | Status: AC
  Administered 2017-09-09: 1500 mg via INTRAVENOUS
  Filled 2017-09-08: qty 1500

## 2017-09-08 MED ORDER — HEPARIN SOD (PORK) LOCK FLUSH 100 UNIT/ML IV SOLN
250.0000 [IU] | Freq: Once | INTRAVENOUS | Status: DC | PRN
  Filled 2017-09-08: qty 5

## 2017-09-08 MED ORDER — EPINEPHRINE PF 1 MG/ML IJ SOLN
0.0000 ug/min | INTRAVENOUS | Status: DC
  Filled 2017-09-08: qty 4

## 2017-09-08 MED ORDER — NITROGLYCERIN IN D5W 200-5 MCG/ML-% IV SOLN
2.0000 ug/min | INTRAVENOUS | Status: DC
  Filled 2017-09-08: qty 250

## 2017-09-08 MED ORDER — CYANOCOBALAMIN 1000 MCG/ML IJ SOLN
INTRAMUSCULAR | Status: AC
  Filled 2017-09-08: qty 1

## 2017-09-08 MED ORDER — POTASSIUM CHLORIDE 2 MEQ/ML IV SOLN
80.0000 meq | INTRAVENOUS | Status: DC
  Filled 2017-09-08: qty 40

## 2017-09-08 MED ORDER — NOREPINEPHRINE BITARTRATE 1 MG/ML IV SOLN
0.0000 ug/min | INTRAVENOUS | Status: DC
  Filled 2017-09-08: qty 4

## 2017-09-08 MED ORDER — CYANOCOBALAMIN 1000 MCG/ML IJ SOLN
1000.0000 ug | Freq: Once | INTRAMUSCULAR | Status: AC
  Administered 2017-09-08: 1000 ug via INTRAMUSCULAR

## 2017-09-08 MED ORDER — DOPAMINE-DEXTROSE 3.2-5 MG/ML-% IV SOLN
0.0000 ug/kg/min | INTRAVENOUS | Status: DC
  Filled 2017-09-08: qty 250

## 2017-09-08 MED ORDER — SODIUM CHLORIDE 0.9 % IV SOLN
30.0000 ug/min | INTRAVENOUS | Status: DC
  Filled 2017-09-08: qty 2

## 2017-09-08 NOTE — Progress Notes (Signed)
Prescription called in at [place], patient notified and TAVR nurse notified.

## 2017-09-08 NOTE — H&P (Signed)
[address]       [place], [address]             [phone]      Cardiothoracic Surgery Admission History and Physical   Referring Provider is Dr. Sallyanne Kuster  Primary Cardiologist is Dr. Percival Spanish  PCP is Carollee Herter, Alferd Apa, DO     Chief Complaint  Patient presents with severe aortic stenosis         HPI:  The patient is a 77 year old gentleman with a history of hypertension, hyperlipidemia, type 2 diabetes, obstructive sleep apnea on CPAP, coronary artery disease status post CABG x5 in 2001 by me, and diffuse large B-cell lymphoma currently undergoing chemotherapy. He has a history of aortic stenosis diagnosed in 2015 when he was noted to have a heart murmur on exam. An echocardiogram at that time showed a mean gradient of 14 mmHg with a peak gradient of 31 mmHg. The dimensionless index was 0.48. Left ventricular ejection fraction was 55-65%. He had a follow-up echocardiogram in July 2018 which showed an increase in the mean gradient to 40 mmHg with an aortic valve area of 0.78 cm. He reportedly had a heart rate in the 30's intermittently but had no dizziness or syncope. A Holter monitor showed occasional transient 2-1 heart block. Then in August 2018 he was diagnosed with diffuse large B-cell lymphoma after a biopsy of a left axillary lymph node was performed. His presenting complaint was some neck pain. He had a Port-A-Cath placed on 03/06/2017 and was started on chemotherapy with Adriamycin, vincristine, cytoxan, and rituxan under the direction of Dr. Marin Olp. His last course of chemo was on 07/18/2017 with plans for his last session on 08/08/2017. He recently presented with a several month history of progressively worsening exertional fatigue and shortness of breath. This had progressed to the point where he was getting shortness of breath with walking in his house. This was associated with some chest tightness. He has not had any dizziness or syncope. He began having  symptoms at rest while watching TV and came to the [facility] emergency department. His troponin was 0.09. The curve stayed flat at 0.10. He improved with diuresis. Cardiac catheterization was performed earlier today and showed severe native three-vessel coronary disease with a patent right internal mammary artery graft to the posterior descending artery, patent left internal mammary artery graft to the LAD, and a patent sequential saphenous vein graft to intermediate and obtuse marginal branch. 1 of his vein grafts to a small diagonal branch was occluded.  He is a retired Restaurant manager, fast food and previously worked for [employer] until retiring about 11 years ago. He says that he was playing golf 7 days/week until recently. He is divorced and lives alone. He has 2 children who live close by.      Past Medical History:  Diagnosis Date  . Anemia   . Axillary adenopathy 02/25/2017  . Bradycardia    a. holter monitor has demonstrated HRs in 30s and [place]   . CAD (coronary artery disease)    a. s/p CABG 2001  . Chronic lower back pain   . Diffuse large B cell lymphoma ([place])   . Diverticulosis   . Esophageal stricture   . GERD (gastroesophageal reflux disease)   . History of gout   . HTN (hypertension)   . Mixed hyperlipidemia   . OSA on CPAP    with 2L O2 at night  . Pancytopenia ([place])  a. related to chemo therapy for B cell lymphoma  . Peptic stricture of esophagus   . Severe aortic stenosis   . Spinal stenosis   . Type II diabetes mellitus ([place])         Past Surgical History:  Procedure Laterality Date  . APPENDECTOMY  ~ 1952  . BACK SURGERY    . CATARACT EXTRACTION W/PHACO Left 11/06/2016   Procedure: CATARACT EXTRACTION PHACO AND INTRAOCULAR LENS PLACEMENT (IOC); Surgeon: Estill Cotta, MD; Location: [facility] ORS; Service: Ophthalmology; Laterality: Left; Lot # [id number]  [place]: 01:09.4  AP%:25.2  CDE: 30.64  . CATARACT EXTRACTION W/PHACO Right 12/04/2016   Procedure: CATARACT  EXTRACTION PHACO AND INTRAOCULAR LENS PLACEMENT (IOC); Surgeon: Estill Cotta, MD; Location: [facility] ORS; Service: Ophthalmology; Laterality: Right; [place] 1:25.9  AP% 24.1  CDE 39.10  Fluid Pack lot # [id number]  . COLONOSCOPY W/ BIOPSIES AND POLYPECTOMY  2013  . CORONARY ANGIOPLASTY  1993  . CORONARY ANGIOPLASTY WITH STENT PLACEMENT  05/1997   "1"  . CORONARY ARTERY BYPASS GRAFT  03/2000   "CABG X5"  . ESOPHAGEAL DILATION  X 3-4   Dr. Lyla Son; "last one was in the 1990's"  . ESOPHAGOGASTRODUODENOSCOPY     multiple  . FLEXIBLE SIGMOIDOSCOPY     multiple  . HYDRADENITIS EXCISION Left 02/25/2017   Procedure: EXCISION DEEP LEFT AXILLARY LYMPH NODE; Surgeon: Fanny Skates, MD; Location: [place]; Service: General; Laterality: Left;  . KNEE ARTHROSCOPY Left 2011   meniscus repair  . LEFT HEART CATHETERIZATION WITH CORONARY/GRAFT ANGIOGRAM N/A 03/16/2014   Procedure: LEFT HEART CATHETERIZATION WITH Beatrix Fetters; Surgeon: Burnell Blanks, MD; Location[facility] CATH LAB; Service: Cardiovascular; Laterality: N/A;  . LUMBAR LAMINECTOMY/DECOMPRESSION MICRODISCECTOMY Right 06/17/2013   Procedure: LUMBAR LAMINECTOMY MICRODISCECTOMY L4-L5 RIGHT EXCISION OF SYNOVIAL CYST RIGHT (1 LEVEL) RIGHT PARTIAL FACETECTOMY; Surgeon: Tobi Bastos, MD; Location: [facility] ORS; Service: Orthopedics; Laterality: Right;  . MYELOGRAM  04/06/13   lumbar, Dr Gladstone Lighter  . PANENDOSCOPY    . PORTACATH PLACEMENT N/A 03/06/2017   Procedure: INSERTION PORT-A-CATH AND ASPIRATE SEROMA LEFT AXILLA; Surgeon: Fanny Skates, MD; Location: [place]; Service: General; Laterality: N/A;  . RIGHT/LEFT HEART CATH AND CORONARY/GRAFT ANGIOGRAPHY N/A 07/28/2017   Procedure: RIGHT/LEFT HEART CATH AND CORONARY/GRAFT ANGIOGRAPHY; Surgeon: Sherren Mocha, MD; Location: [place] CV LAB; Service: Cardiovascular; Laterality: N/A;  . SHOULDER SURGERY Right 08/2010   screws placed; "tendons tore off"  . SKIN CANCER EXCISION Right    "neck"    . TONSILLECTOMY  ~ 1954  . UPPER GI ENDOSCOPY  2013   Gastritis; Dr Carlean Purl  . VASECTOMY          Family History  Problem Relation Age of Onset  . Stroke Father   . Hypertension Father   . Pancreatic cancer Mother   . Diabetes Maternal Grandmother   . Stroke Maternal Grandmother   . Heart attack Paternal Grandmother   . Colon cancer Neg Hx   . Esophageal cancer Neg Hx   . Rectal cancer Neg Hx   . Stomach cancer Neg Hx   . Ulcers Neg Hx    Social History        Socioeconomic History  . Marital status: Divorced    Spouse name: Not on file  . Number of children: 2  . Years of education: Not on file  . Highest education level: Not on file  Social Needs  . Financial resource strain: Not on file  . Food insecurity - worry: Not on file  .  Food insecurity - inability: Not on file  . Transportation needs - medical: Not on file  . Transportation needs - non-medical: Not on file  Occupational History    Employer: RETIRED  Tobacco Use  . Smoking status: Never Smoker  . Smokeless tobacco: Never Used  Substance and Sexual Activity  . Alcohol use: No    Alcohol/week: 0.0 oz    Comment: "last drink was in 2012"( 03/15/2014)  . Drug use: No  . Sexual activity: Not Currently  Other Topics Concern  . Not on file  Social History Narrative   Divorced, lives with a roommate.   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            Current Facility-Administered Medications  Medication Dose Route Frequency Provider Last Rate Last Dose  . 0.9 % sodium chloride infusion  Intravenous Continuous Sherren Mocha, MD    . 0.9 % sodium chloride infusion 250 mL Intravenous PRN Sherren Mocha, MD    . acetaminophen (TYLENOL) tablet 650 mg 650 mg Oral Q4H PRN Chakravartti, Jaidip, MD    . allopurinol (ZYLOPRIM) tablet  450 mg 450 mg Oral Daily Chakravartti, Jaidip, MD  450 mg at 07/27/17 0814  . amLODipine (NORVASC) tablet 10 mg 10 mg Oral Daily Chakravartti, Jaidip, MD  10 mg at 07/27/17 0816  . amoxicillin-clavulanate (AUGMENTIN) 875-125 MG per tablet 1 tablet 1 tablet Oral BID Minus Breeding, MD  1 tablet at 07/28/17 1255  . aspirin EC tablet 81 mg 81 mg Oral Daily Chakravartti, Jaidip, MD  81 mg at 07/28/17 0851  . atorvastatin (LIPITOR) tablet 80 mg 80 mg Oral QHS Chakravartti, Jaidip, MD  80 mg at 07/27/17 2124  . cyclobenzaprine (FLEXERIL) tablet 5 mg 5 mg Oral TID PRN Eileen Stanford, PA-C  5 mg at 07/25/17 1540  . ezetimibe (ZETIA) tablet 10 mg 10 mg Oral Daily Chakravartti, Jaidip, MD  10 mg at 07/27/17 0813  . furosemide (LASIX) tablet 120 mg 120 mg Oral BID Chakravartti, Jaidip, MD  120 mg at 07/28/17 1256  . insulin aspart (novoLOG) injection 0-9 Units 0-9 Units Subcutaneous TID WC Vedre, Ameeth, MD    . lipase/protease/amylase (CREON) capsule 36,000 Units 36,000 Units Oral PRN Croitoru, Mihai, MD    . lipase/protease/amylase (CREON) capsule 72,000 Units 72,000 Units Oral TID WC Chakravartti, Jaidip, MD  72,000 Units at 07/28/17 1255  . LORazepam (ATIVAN) tablet 0.5 mg 0.5 mg Oral Q6H PRN Chakravartti, Jaidip, MD    . magnesium hydroxide (MILK OF MAGNESIA) suspension 15 mL 15 mL Oral Daily PRN Croitoru, Mihai, MD  15 mL at 07/27/17 0832  . menthol-cetylpyridinium (CEPACOL) lozenge 3 mg 1 lozenge Oral PRN Nila Nephew, MD  3 mg at 07/26/17 2150  . metoprolol tartrate (LOPRESSOR) tablet 12.5 mg 12.5 mg Oral BID Lelon Perla, MD  12.5 mg at 07/27/17 2124  . nitroGLYCERIN (NITROSTAT) SL tablet 0.4 mg 0.4 mg Sublingual Q5 Min x 3 PRN Chakravartti, Jaidip, MD    . ondansetron (ZOFRAN) injection 4 mg 4 mg Intravenous Q6H PRN Chakravartti, Jaidip, MD    . pantoprazole (PROTONIX) EC tablet 40 mg 40 mg Oral Daily Chakravartti, Jaidip, MD  40 mg at 07/27/17 0816  . pregabalin (LYRICA) capsule 75  mg 75 mg Oral TID Doylene Canning, MD  75 mg at 07/27/17  2124  . sodium chloride flush (NS) 0.9 % injection 10-40 mL 10-40 mL Intracatheter PRN Croitoru, Mihai, MD    . sodium chloride flush (NS) 0.9 % injection 3 mL 3 mL Intravenous Q12H Sherren Mocha, MD    . sodium chloride flush (NS) 0.9 % injection 3 mL 3 mL Intravenous PRN Sherren Mocha, MD              Facility-Administered Medications Ordered in Other Encounters  Medication Dose Route Frequency Provider Last Rate Last Dose  . sodium chloride flush (NS) 0.9 % injection 10 mL 10 mL Intravenous PRN [name], MD  10 mL at 05/30/17 0920        Allergies  Allergen Reactions  . Benazepril Swelling    angioedema; he is not a candidate for any angiotensin receptor blockers because of this significant allergic reaction.  Because of a history of documented adverse serious drug reaction;Medi Alert bracelet is recommended  . Hctz [Hydrochlorothiazide] Anaphylaxis and Swelling    Tongue and lip swelling   . Aspirin Other (See Comments)    Gastritis, cant take 325 Mg aspirin   . Lactose Intolerance (Gi) Nausea And Vomiting   Review of Systems:   General: normal appetite, poor energy, no weight gain, no weight loss, no fever  Cardiac: has chest pain with exertion, occasional recent chest pain at rest, SOB with minimla exertion, some resting SOB, no PND, has orthopnea, no palpitations, no arrhythmia, no atrial fibrillation, has LE edema, no dizzy spells, no syncope  Respiratory: has shortness of breath, no home oxygen, no productive cough, no dry cough, no bronchitis, no wheezing, no hemoptysis, no asthma, no pain with inspiration or cough, has sleep apnea, uses CPAP at night  GI: no difficulty swallowing, no reflux, no frequent heartburn, no hiatal hernia, no abdominal pain, no constipation, no diarrhea, no hematochezia, no hematemesis, no melena  GU: no dysuria, no frequency, no urinary tract infection, no hematuria, no enlarged  prostate, no kidney stones, no kidney disease  Vascular: no pain suggestive of claudication, no pain in feet, no leg cramps, no varicose veins, no DVT, no non-healing foot ulcer  Neuro: no stroke, no TIA's, no seizures, no headaches, no temporary blindness one eye, no slurred speech, no peripheral neuropathy, no chronic pain, no instability of gait, no memory/cognitive dysfunction  Musculoskeletal: no arthritis, no joint swelling, no myalgias, no difficulty walking, normal mobility  Skin: no rash, no itching, no skin infections, no pressure sores or ulcerations  Psych: no anxiety, no depression, no nervousness, no unusual recent stress  Eyes: no blurry vision, no floaters, no recent vision changes,  ENT: no hearing loss, no loose or painful teeth, partial dentures, last saw dentist this year  Hematologic: no easy bruising, no abnormal bleeding, no clotting disorder, no frequent epistaxis  Endocrine: has diabetes, does check CBG's at home   Physical Exam:  Ht 6' (1.829 m)   Wt 111.4 kg (245 lb 11.2 oz)   SpO2 94%   BMI 33.32 kg/m    General: Obese gentleman, well-appearing  HEENT: Unremarkable, NCAT, PERLA, EOMI, oropharynx clear  Neck: no JVD, no bruits, no adenopathy or thyromegaly  Chest: clear to auscultation, symmetrical breath sounds, no wheezes, no rhonchi  CV: RRR, grade IV/VI crescendo/decrescendo murmur heard best at RSB, no diastolic murmur  Abdomen: soft, non-tender, no masses or organomegaly  Extremities: warm, well-perfused, pulses palpable in feet, no LE edema  Rectal/GU Deferred  Neuro: Grossly non-focal and symmetrical throughout  Skin: Clean  and dry, no rashes, no breakdown  Diagnostic Tests:  [facility]  [address], [place] [address]  [phone]  -------------------------------------------------------------------  Echocardiography  Patient: Donyell, Ding  MR #: [id number]  Study Date: 05/16/2017  Gender: M  Age: 82  Height: 175.3 cm   Weight: 77.5 kg  BSA: 1.95 m^2  Pt. Status:  Room:  ATTENDING Candee Furbish, M.D.  PERFORMING [facility], Outpatient  SONOGRAPHER [facility], [place] Almyra Deforest [id number]  [place] [phone]  cc:  -------------------------------------------------------------------  LV EF: 55% - 60%  -------------------------------------------------------------------  Indications: Aortic Stenosis (I35.0).  -------------------------------------------------------------------  History: PMH: Severe Aortic Stenosis (prior Mean Gradient  49mmHg, 01-16-17) Obstructive Sleep Apnea, Wenckebach Block, Edema,  Diffuse Large Cell Non-Hodgkin&'s Lymphoma with Chemotherapy (4  treatments done and in Remission, with 4 Treatments left) Dyspnea  and murmur. Coronary artery disease. Aortic valve disease. Risk  factors: Family history of coronary artery disease. Hypertension.  Diabetes mellitus. Dyslipidemia.  -------------------------------------------------------------------  Study Conclusions  - HPI and indications: Severe Aortic Stenosis (prior Mean Gradient  32mmHg, 01-16-17)  - Left ventricle: The cavity size was normal. Systolic function was  normal. The estimated ejection fraction was in the range of 55%  to 60%. Wall motion was normal; there were no regional wall  motion abnormalities. Doppler parameters are consistent with  abnormal left ventricular relaxation (grade 1 diastolic  dysfunction). GLS: -17.2%  - Aortic valve: Valve mobility was restricted. There was severe  stenosis. There was trivial regurgitation. Peak velocity (S): 492  cm/s. Mean gradient (S): 54 mm Hg. Valve area (VTI): 0.84 cm^2.  Valve area (Vmax): 0.83 cm^2. Valve area (Vmean): 0.82 cm^2.  - Aorta: Ascending aortic diameter: 40 mm (S).  - Ascending aorta: The ascending aorta was mildly dilated.  - Mitral valve: Calcified annulus. There was mild regurgitation.  Valve area by continuity equation (using LVOT flow): 2 cm^2.    - Left atrium: The atrium was severely dilated. Volume/bsa, ES  (1-plane Simpson&'s, A4C): 61.5 ml/m^2.  - Right ventricle: The cavity size was moderately dilated. Wall  thickness was normal.  - Right atrium: The atrium was severely dilated.  - Tricuspid valve: There was mild regurgitation.  Impressions:  - Aortic stenosis has advanced.  -------------------------------------------------------------------  Study data: Comparison was made to the study of 01/16/2017. Study  status: Routine. Procedure: Transthoracic echocardiography.  Image quality was adequate. Echocardiography. M-mode,  complete 2D, 3D, spectral Doppler, and color Doppler. Birthdate:  Patient birthdate: 1940/11/09. Age: Patient is 77 yr old. Sex:  Gender: male. BMI: 25.2 kg/m^2. Blood pressure: 140/72  Patient status: Outpatient. Study date: Study date: 05/16/2017.  Study time: 07:30 AM. Location: [facility]  -------------------------------------------------------------------  -------------------------------------------------------------------  Left ventricle: The cavity size was normal. Systolic function was  normal. The estimated ejection fraction was in the range of 55% to  60%. Wall motion was normal; there were no regional wall motion  abnormalities. GLS: -17.2% Doppler parameters are consistent with  abnormal left ventricular relaxation (grade 1 diastolic  dysfunction).  -------------------------------------------------------------------  Aortic valve: Trileaflet; severely thickened, severely calcified  leaflets. Valve mobility was restricted. Doppler: There was  severe stenosis. There was trivial regurgitation. VTI ratio of  LVOT to aortic valve: 0.22. Valve area (VTI): 0.84 cm^2. Indexed  valve area (VTI): 0.43 cm^2/m^2. Peak velocity ratio of LVOT to  aortic valve: 0.22. Valve area (Vmax): 0.83 cm^2. Indexed valve  area (Vmax): 0.43 cm^2/m^2. Mean velocity ratio of LVOT to aortic  valve: 0.22. Valve  area (Vmean): 0.82 cm^2. Indexed valve area  (Vmean): 0.42 cm^2/m^2. Mean gradient (S): 54 mm Hg. Peak  gradient (S): 97 mm Hg.  -------------------------------------------------------------------  Aorta: Ascending aorta: The ascending aorta was mildly dilated.  -------------------------------------------------------------------  Mitral valve: Calcified annulus. Mobility was not restricted.  Doppler: Transvalvular velocity was within the normal range. There  was no evidence for stenosis. There was mild regurgitation.  Valve area by pressure half-time: 3.14 cm^2. Indexed valve area by  pressure half-time: 1.61 cm^2/m^2. Valve area by continuity  equation (using LVOT flow): 2 cm^2. Indexed valve area by  continuity equation (using LVOT flow): 1.02 cm^2/m^2. Mean  gradient (D): 5 mm Hg. Peak gradient (D): 6 mm Hg.  -------------------------------------------------------------------  Left atrium: The atrium was severely dilated.  -------------------------------------------------------------------  Right ventricle: The cavity size was moderately dilated. Wall  thickness was normal. Systolic function was normal.  -------------------------------------------------------------------  Pulmonic valve: Doppler: Transvalvular velocity was within the  normal range. There was no evidence for stenosis.  -------------------------------------------------------------------  Tricuspid valve: Structurally normal valve. Doppler:  Transvalvular velocity was within the normal range. There was mild  regurgitation.  -------------------------------------------------------------------  Pulmonary artery: The main pulmonary artery was normal-sized.  Systolic pressure was within the normal range.  -------------------------------------------------------------------  Right atrium: The atrium was severely dilated.  -------------------------------------------------------------------  Pericardium: There was no  pericardial effusion.  -------------------------------------------------------------------  Systemic veins:  Inferior vena cava: The vessel was normal in size.  -------------------------------------------------------------------  Measurements  Left ventricle Value Reference  LV ID, ED, PLAX chordal 48.7 mm 43 - 52  LV ID, ES, PLAX chordal 30.8 mm 23 - 38  LV fx shortening, PLAX chordal 37 % >=29  LV PW thickness, ED 17.5 mm ----------  IVS/LV PW ratio, ED 0.66 <=1.3  Stroke volume, 2D 97 ml ----------  Stroke volume/bsa, 2D 50 ml/m^2 ----------  LV e&', lateral 6.74 cm/s ----------  LV E/e&', lateral 18.84 ----------  LV e&', medial 8.38 cm/s ----------  LV E/e&', medial 15.16 ----------  LV e&', average 7.56 cm/s ----------  LV E/e&', average 16.8 ----------  Longitudinal strain, TDI 17 % ----------  Ventricular septum Value Reference  IVS thickness, ED 11.5 mm ----------  LVOT Value Reference  LVOT ID, S 22 mm ----------  LVOT area 3.8 cm^2 ----------  LVOT peak velocity, S 108 cm/s ----------  LVOT mean velocity, S 71.5 cm/s ----------  LVOT VTI, S 25.4 cm ----------  Aortic valve Value Reference  Aortic valve peak velocity, S 492 cm/s ----------  Aortic valve mean velocity, S 330 cm/s ----------  Aortic valve VTI, S 115 cm ----------  Aortic mean gradient, S 54 mm Hg ----------  Aortic peak gradient, S 97 mm Hg ----------  VTI ratio, LVOT/AV 0.22 ----------  Aortic valve area, VTI 0.84 cm^2 ----------  Aortic valve area/bsa, VTI 0.43 cm^2/m^2 ----------  Velocity ratio, peak, LVOT/AV 0.22 ----------  Aortic valve area, peak velocity 0.83 cm^2 ----------  Aortic valve area/bsa, peak 0.43 cm^2/m^2 ----------  velocity  Velocity ratio, mean, LVOT/AV 0.22 ----------  Aortic valve area, mean velocity 0.82 cm^2 ----------  Aortic valve area/bsa, mean 0.42 cm^2/m^2 ----------  velocity  Aortic regurg pressure half-time 436 ms ----------  Aorta Value Reference  Aortic root  ID, ED 36 mm ----------  Ascending aorta ID, A-P, S 40 mm ----------  Left atrium Value Reference  LA ID, A-P, ES 52 mm ----------  LA ID/bsa, A-P (H) 2.66 cm/m^2 <=2.2  LA volume, S 112 ml ----------  LA  volume/bsa, S 57.4 ml/m^2 ----------  LA volume, ES, 1-p A4C 120 ml ----------  LA volume/bsa, ES, 1-p A4C 61.5 ml/m^2 ----------  LA volume, ES, 1-p A2C 94.4 ml ----------  LA volume/bsa, ES, 1-p A2C 48.4 ml/m^2 ----------  Mitral valve Value Reference  Mitral E-wave peak velocity 127 cm/s ----------  Mitral A-wave peak velocity 148 cm/s ----------  Mitral mean velocity, D 96.9 cm/s ----------  Mitral deceleration time (H) 236 ms 150 - 230  Mitral pressure half-time 70 ms ----------  Mitral mean gradient, D 5 mm Hg ----------  Mitral peak gradient, D 6 mm Hg ----------  Mitral E/A ratio, peak 0.9 ----------  Mitral valve area, PHT, DP 3.14 cm^2 ----------  Mitral valve area/bsa, PHT, DP 1.61 cm^2/m^2 ----------  Mitral valve area, LVOT 2 cm^2 ----------  continuity  Mitral valve area/bsa, LVOT 1.02 cm^2/m^2 ----------  continuity  Mitral annulus VTI, D 48.3 cm ----------  Mitral regurg VTI, PISA 229 cm ----------  Tricuspid valve Value Reference  Tricuspid regurg peak velocity 300 cm/s ----------  Tricuspid peak RV-RA gradient 36 mm Hg ----------  Right atrium Value Reference  RA ID, S-I, ES, A4C (H) 69.3 mm 34 - 49  RA area, ES, A4C (H) 30.3 cm^2 8.3 - 19.5  RA volume, ES, A/L 107 ml ----------  RA volume/bsa, ES, A/L 54.8 ml/m^2 ----------  Right ventricle Value Reference  TAPSE 21.1 mm ----------  RV s&', lateral, S 14.5 cm/s ----------  Legend:  (L) and (H) mark values outside specified reference range.  -------------------------------------------------------------------  Prepared and Electronically Authenticated by  Candee Furbish, M.D.  2018-11-16T10:14:52     Physicians  Panel Physicians Referring Physician Case Authorizing Physician  Sherren Mocha, MD  (Primary)    Procedures  RIGHT/LEFT HEART CATH AND CORONARY/GRAFT ANGIOGRAPHY  Conclusion  1. Severe native three-vessel coronary artery disease with moderate distal left main stenosis, total occlusion of the LAD, total occlusion of the ramus intermedius, total occlusion of the first OM, and total occlusion of the RCA.  2. Status post aortocoronary bypass surgery with continued patency of the RIMA to PDA, LIMA to LAD, and sequential saphenous vein graft to ramus intermedius and first OM branches of the circumflex.  3. Chronic occlusion of the saphenous vein graft to diagonal, the diagonal branch is collateralized by the apical LAD  4. Severe calcific aortic stenosis with a mean transvalvular gradient of 51 mmHg  Indications  Severe aortic stenosis [I35.0 (ICD-10-CM)]  Procedural Details/Technique  Technical Details INDICATION: Severe symptomatic aortic stenosis  PROCEDURAL DETAILS: The right groin was prepped, draped, and anesthetized with 1% lidocaine. Using ultrasound guidance and a micropuncture technique, a 5 French sheath was placed in the right femoral artery and a 7 French sheath was placed in the right femoral vein. Both vessels were accessed with a front wall puncture. The ultrasound images captured and stored in the patient's chart. A Swan-Ganz catheter was used for the right heart catheterization. Standard protocol was followed for recording of right heart pressures and sampling of oxygen saturations. Fick cardiac output was calculated. Standard Judkins catheters were used for selective coronary angiography and bypass graft angiography. The aortic valve is crossed with an AL 2 catheter and a straight wire. Left ventricular pressure is recorded. And aortic valve pullback is performed. There were no immediate procedural complications. The patient was transferred to the post catheterization recovery area for further monitoring.      Estimated blood loss <50 mL.  During this procedure  the patient was administered  the following to achieve and maintain moderate conscious sedation: Versed 1 mg, Fentanyl 25 mcg, while the patient's heart rate, blood pressure, and oxygen saturation were continuously monitored. The period of conscious sedation was 49 minutes, of which I was present face-to-face 100% of this time.  Coronary Findings  Diagnostic  Dominance: Right  Left Main  Mid LM to Dist LM lesion 60% stenosed  Mid LM to Dist LM lesion is 60% stenosed. The lesion is moderately calcified.  Left Anterior Descending  Prox LAD to Mid LAD lesion 100% stenosed  Prox LAD to Mid LAD lesion is 100% stenosed. The lesion is moderately calcified.  First Diagonal Branch  Collaterals  1st Diag filled by collaterals from 3rd Diag.    Second Diagonal Branch  Collaterals  2nd Diag filled by collaterals from Dist LAD.    Ramus Intermedius  Ost Ramus lesion 100% stenosed  Ost Ramus lesion is 100% stenosed.  Left Circumflex  First Obtuse Marginal Branch  Ost 1st Mrg lesion 100% stenosed  Ost 1st Mrg lesion is 100% stenosed.  Right Coronary Artery  Prox RCA to Mid RCA lesion 100% stenosed  Prox RCA to Mid RCA lesion is 100% stenosed.  LIMA Graft to Mid LAD  Graft to Ost 1st Diag  Origin to Prox Graft lesion 100% stenosed  Origin to Prox Graft lesion is 100% stenosed. SVG-diagonal 100% occluded unchanged from prior study  Sequential jump graft Graft to Ramus, 1st Mrg  Seq SVG- Ramus Intermedius and First OM graft was visualized by angiography. The graft exhibits minimal luminal irregularities. Widely patent graft fills the ramus intermedius, first OM, and retrograde fills the distal AV circumflex into the second OM.  Free RIMA Graft to RPDA  RIMA. The RIMA to PDA graft is widely patent.  Intervention  No interventions have been documented.  Left Heart  Aortic Valve There is severe aortic valve stenosis. The aortic valve is calcified. There is restricted aortic valve motion. The mean  transaortic gradient is 51 mmHg. The peak to peak gradient is 68 mmHg. The calculated aortic valve area is 1.2 cm.  Coronary Diagrams  Diagnostic Diagram     Implants     No implant documentation for this case.  MERGE Images  Link to Procedure Log   Show images for CARDIAC CATHETERIZATION Procedure Log  Hemo Data   Most Recent Value  Fick Cardiac Output 5.65 L/min  Fick Cardiac Output Index 2.44 (L/min)/BSA  Aortic Mean Gradient 51.2 mmHg  Aortic Peak Gradient 68 mmHg  Aortic Valve Area 1.19  Aortic Value Area Index 0.51 cm2/BSA  RA A Wave 6 mmHg  RA V Wave 6 mmHg  RA Mean 3 mmHg  RV Systolic Pressure 36 mmHg  RV Diastolic Pressure -2 mmHg  RV EDP 6 mmHg  PA Systolic Pressure 26 mmHg  PA Diastolic Pressure 8 mmHg  PA Mean 14 mmHg  PW A Wave 9 mmHg  PW V Wave 11 mmHg  PW Mean 7 mmHg  AO Systolic Pressure 96 mmHg  AO Diastolic Pressure 44 mmHg  AO Mean 65 mmHg  LV Systolic Pressure 161 mmHg  LV Diastolic Pressure -9 mmHg  LV EDP 16 mmHg  Arterial Occlusion Pressure Extended Systolic Pressure 096 mmHg  Arterial Occlusion Pressure Extended Diastolic Pressure 39 mmHg  Arterial Occlusion Pressure Extended Mean Pressure 60 mmHg  Left Ventricular Apex Extended Systolic Pressure 045 mmHg  Left Ventricular Apex Extended Diastolic Pressure 0 mmHg  Left Ventricular Apex Extended EDP Pressure 17 mmHg  QP/QS 1  TPVR Index 5.75 HRUI  TSVR Index 26.71 HRUI  PVR SVR Ratio 0.11  TPVR/TSVR Ratio 0.22    ADDENDUM REPORT: 07/29/2017 13:40  CLINICAL DATA:  Aortic stenosis  EXAM: Cardiac TAVR CT  TECHNIQUE: The patient was scanned on a Siemens Force 381 slice scanner. A 120 kV retrospective scan was triggered in the ascending thoracic aorta at 140 HU's. Gantry rotation speed was 270 msecs and collimation was .9 mm. No beta blockade or nitro were given. The 3D data set was reconstructed in 5% intervals of the R-R cycle. Systolic and diastolic phases were analyzed on a  dedicated work station using MPR, MIP and VRT modes. The patient received 80 cc of contrast.  FINDINGS: Aortic Valve: Tri leaflet severely calcified with restricted motion  Aorta: Moderate calcific atherosclerotic debris  Sinotubular Junction: 30 mm  Ascending Thoracic Aorta: 37 mm  Aortic Arch: 29 mm  Descending Thoracic Aorta: 30 mm  Sinus of Valsalva Measurements:  Non-coronary: 32 mm  Right -coronary: 30 mm  Left -coronary: 31 mm  Coronary Artery Height above Annulus:  Left Main: 10.6 mm above annulus  Right Coronary: 13.4 mm above annulus  Virtual Basal Annulus Measurements:  Maximum/Minimum Diameter: 27.5 mm x 21.9 mm  Perimeter: 77.7 mm  Area: 476 mm2  Coronary Arteries: Sufficient height above annulus for deployment. There is a patent RIMA to PDA, Patent LIMA to LAD, Patent sequential SVG to IM/OM and Occluded SVG to diagonal  Optimum Fluoroscopic Angle for Delivery: LAO 11 degrees Caudal 14 degrees  IMPRESSION: 1. Calcified tri leaflet AV with annulus 476 mm2 suitable for a 26 mm Sapien 3 valve  2. Optimum angiographic angle for delivery LAO 11 degrees Caudal 14 degrees  3. Coronary arteries sufficient height above annulus for deployment Patent RIMA to PDA, Patent LIMA to LAD, Patent sequential SVG IM/OM Occluded SVG Diagonal  4.  No LAA thrombus  Jenkins Rouge   Electronically Signed   By: Jenkins Rouge M.D.   On: 07/29/2017 13:40  CLINICAL DATA:  77 year old male with history of aortic stenosis. Preprocedural study prior to potential transcatheter aortic valve replacement (TAVR) procedure.  EXAM: CT ANGIOGRAPHY CHEST, ABDOMEN AND PELVIS  TECHNIQUE: Multidetector CT imaging through the chest, abdomen and pelvis was performed using the standard protocol during bolus administration of intravenous contrast. Multiplanar reconstructed images and MIPs were obtained and reviewed to evaluate the vascular  anatomy.  CONTRAST:  28mL ISOVUE-370 IOPAMIDOL (ISOVUE-370) INJECTION 76%  COMPARISON:  PET-CT 04/15/2017.  FINDINGS: CTA CHEST FINDINGS  Cardiovascular: Heart size is mildly enlarged. There is no significant pericardial fluid, thickening or pericardial calcification. There is aortic atherosclerosis, as well as atherosclerosis of the great vessels of the mediastinum and the coronary arteries, including calcified atherosclerotic plaque in the left main, left anterior descending, left circumflex and right coronary arteries. Status post median sternotomy for CABG including LIMA to the LAD. Severe thickening calcification of the aortic valve. Right internal jugular single-lumen porta cath with tip terminating in the right atrium.  Mediastinum/Lymph Nodes: No pathologically enlarged mediastinal or hilar lymph nodes. Esophagus is unremarkable in appearance. No axillary lymphadenopathy. Surgical clips are noted in the left axillary region from prior lymph node dissection. Superficial to this there is a 4.2 x 3.2 cm low-attenuation lesion, likely to represent a small postoperative seroma.  Lungs/Pleura: No acute consolidative airspace disease. No pleural effusions. No suspicious appearing pulmonary nodules or masses.  Musculoskeletal/Soft Tissues: Median sternotomy wires. There are no aggressive appearing lytic or blastic  lesions noted in the visualized portions of the skeleton.  CTA ABDOMEN AND PELVIS FINDINGS  Hepatobiliary: No suspicious cystic or solid hepatic lesions. No intra or extrahepatic biliary ductal dilatation. Gallbladder is normal in appearance.  Pancreas: No pancreatic mass. No pancreatic ductal dilatation. No pancreatic or peripancreatic fluid or inflammatory changes.  Spleen: Unremarkable.  Adrenals/Urinary Tract: Bilateral kidneys and bilateral adrenal glands are normal in appearance. No hydroureteronephrosis. Urinary bladder is mildly  trabeculated, but otherwise unremarkable in appearance.  Stomach/Bowel: Normal appearance of the stomach. No pathologic dilatation of small bowel or colon. The appendix is not confidently identified and may be surgically absent. Regardless, there are no inflammatory changes noted adjacent to the cecum to suggest the presence of an acute appendicitis at this time.  Vascular/Lymphatic: Vascular findings and measurements relevant to potential TAVR procedure, as detailed below. Aortic atherosclerosis, without evidence of aneurysm. Aneurysmal dilatation of the right common iliac artery which measures up to 2 cm in diameter. Celiac axis, superior mesenteric artery and inferior mesenteric artery are all widely patent without hemodynamically significant stenosis. Two right renal arteries and one left renal artery are all widely patent without hemodynamically significant stenosis. No lymphadenopathy noted in the abdomen or pelvis.  Reproductive: Prostate gland and seminal vesicles are unremarkable in appearance.  Other: No significant volume of ascites.  No pneumoperitoneum.  Musculoskeletal: There are no aggressive appearing lytic or blastic lesions noted in the visualized portions of the skeleton.  VASCULAR MEASUREMENTS PERTINENT TO TAVR:  AORTA:  Minimal Aortic Diameter-12 x 15 mm  Severity of Aortic Calcification-moderate to severe  RIGHT PELVIS:  Right Common Iliac Artery -  Minimal Diameter-9.8 x 7.8 mm  Tortuosity-mild-to-moderate  Calcification-mild  Right External Iliac Artery -  Minimal Diameter-10.1 x 9.2 mm  Tortuosity-moderate  Calcification-mild  Right Common Femoral Artery -  Minimal Diameter-10.0 x 9.0 mm  Tortuosity-mild  Calcification-mild  LEFT PELVIS:  Left Common Iliac Artery -  Minimal Diameter-8.8 x 7.5 mm  Tortuosity-mild  Calcification-mild-to-moderate  Left External Iliac Artery -  Minimal  Diameter-10.1 x 8.8 mm  Tortuosity-mild  Calcification-mild  Left Common Femoral Artery -  Minimal Diameter-10.6 x 10.1 mm  Tortuosity-mild  Calcification-mild  Review of the MIP images confirms the above findings.  IMPRESSION: 1. Vascular findings and measurements pertinent to potential TAVR procedure, as detailed above. 2. Severe thickening calcification of the aortic valve, compatible with the reported clinical history of severe aortic stenosis. 3. Aortic atherosclerosis, in addition to left main and 3 vessel coronary artery disease. Status post median sternotomy for CABG including LIMA to the LAD. 4. Mild aneurysmal dilatation of the right common iliac artery which measures up to 2.0 cm in diameter. 5. Additional incidental findings, as above.  Aortic Atherosclerosis (ICD10-I70.0).   Electronically Signed   By: Vinnie Langton M.D.   On: 07/29/2017 15:52    CLINICAL DATA:  Subsequent treatment strategy for diffuse large B-cell lymphoma. Status post 4 cycles first-line therapy.  EXAM: NUCLEAR MEDICINE PET SKULL BASE TO THIGH  TECHNIQUE: 12.6 mCi F-18 FDG was injected intravenously. Full-ring PET imaging was performed from the skull base to thigh after the radiotracer. CT data was obtained and used for attenuation correction and anatomic localization.  Fasting blood glucose: 109 mg/dl  Mediastinal blood pool activity: SUV max 2.3  COMPARISON:  3 PET-CT 04/15/2017, 817 18  FINDINGS: NECK: No hypermetabolic lymph nodes in the neck.  Incidental CT findings: none  CHEST: No hypermetabolic lymph nodes in the chest. No hypermetabolic axillary lymph nodes.  Seroma in the LEFT chest wall adjacent to the axilla at lymphadenectomy site.  Incidental CT findings: Bilateral pleural effusions are small to moderate and greater on the RIGHT. No suspicious pulmonary nodularity. Port in the RIGHT chest wall with tip in distal  SVC  ABDOMEN/PELVIS: Normal size spleen with normal metabolic activity. No hypermetabolic abdominopelvic lymph nodes.  Incidental CT findings: Atherosclerotic calcification of the aorta. Prostate gland normal  SKELETON: No focal hypermetabolic activity to suggest skeletal metastasis.  Incidental CT findings: none  IMPRESSION: 1. No evidence lymphoma recurrence on skull base to mid thigh FDG PET-CT scan. 2. Bilateral pleural effusions which are small to moderate.   Electronically Signed   By: Suzy Bouchard M.D.   On: 09/01/2017 11:40    [facility] Adult Cardiac Surgery Database Version 2.9 RISK SCORES Procedure: Isolated AVR CALCULATE  Risk of Mortality:  4.897%   Renal Failure:  8.488%   Permanent Stroke:  1.768%   Prolonged Ventilation:  13.467%   DSW Infection:  0.930%   Reoperation:  5.657%   Morbidity or Mortality:  20.141%   Short Length of Stay:  22.794%   Long Length of Stay:  12.454%    Impression:   This 77 year old gentleman has stage D, severe, symptomatic aortic stenosis with NYHA class III symptoms of exertional fatigue and shortness of breath of several months duration consistent with chronic diastolic heart failure. He has recently started having shortness of breath and chest discomfort at rest prompting admission to the hospital. I have personally reviewed his 2D echocardiogram from November and cardiac catheterization from today. His echocardiogram showed a trileaflet aortic valve had severely thickened, calcified, and restricted leaflets. The mean transvalvular gradient was 54 mmHg with a peak gradient of 97 mmHg. The dimensionless index was 0.22 with a aortic valve area of 0.82 cm. Cardiac catheterization today showed severe native three-vessel coronary disease with 4 out of 5 patent bypass grafts and no significant ischemic territories. The mean transvalvular gradient was measured at 51 mmHg. I agree that aortic valve replacement is indicated in this  patient. I think he would be at high risk for redo sternotomy and aortic valve replacement since he has a patent right internal mammary graft to the posterior descending coronary artery and a patent left internal mammary graft to the LAD. In addition he is 77 years old and undergoing treatment for B-cell lymphoma and has pancytopenia. I think his risk would be considerably higher than that predicted by the STS risk calculator. Transcatheter aortic valve replacement would be a reasonable alternative for this patient with much less risk.  I have personally reviewed his gated cardiac CTA and his CTA of the chest, abdomen, and pelvis.  His gated cardiac CTA shows anatomy suitable for transcatheter aortic valve replacement using a Sapien 3 valve.  His abdominal and pelvic vascular anatomy appears suitable for transfemoral insertion.  The patient and his sister were counseled at length regarding treatment alternatives for management of severe symptomatic aortic stenosis. The risks and benefits of surgical intervention has been discussed in detail. Long-term prognosis with medical therapy was discussed. Alternative approaches such as conventional surgical aortic valve replacement, transcatheter aortic valve replacement, and palliative medical therapy were compared and contrasted at length. This discussion was placed in the context of the patient's own specific clinical presentation and past medical history. All of their questions have been addressed.  The patient has been advised of a variety of complications that might develop including but not limited to risks of death,  stroke, paravalvular leak, aortic dissection or other major vascular complications, aortic annulus rupture, device embolization, cardiac rupture or perforation, mitral regurgitation, acute myocardial infarction, arrhythmia, heart block or bradycardia requiring permanent pacemaker placement, congestive heart failure, respiratory failure, renal failure,  pneumonia, infection, other late complications related to structural valve deterioration or migration, or other complications that might ultimately cause a temporary or permanent loss of functional independence or other long term morbidity. The patient provides full informed consent for the procedure as described and all questions were answered.   Plan:   Transfemoral TAVR on 09/09/2017  Gaye Pollack, MD

## 2017-09-08 NOTE — Patient Instructions (Signed)
Cyanocobalamin, Vitamin B12 injection What is this medicine? CYANOCOBALAMIN (sye an oh koe BAL a min) is a man made form of vitamin B12. Vitamin B12 is used in the growth of healthy blood cells, nerve cells, and proteins in the body. It also helps with the metabolism of fats and carbohydrates. This medicine is used to treat people who can not absorb vitamin B12. This medicine may be used for other purposes; ask your health care provider or pharmacist if you have questions. COMMON BRAND NAME(S): B-12 Compliance Kit, B-12 Injection Kit, Cyomin, LA-12, Nutri-Twelve, Physicians EZ Use B-12, Primabalt What should I tell my health care provider before I take this medicine? They need to know if you have any of these conditions: -kidney disease -Leber's disease -megaloblastic anemia -an unusual or allergic reaction to cyanocobalamin, cobalt, other medicines, foods, dyes, or preservatives -pregnant or trying to get pregnant -breast-feeding How should I use this medicine? This medicine is injected into a muscle or deeply under the skin. It is usually given by a health care professional in a clinic or doctor's office. However, your doctor may teach you how to inject yourself. Follow all instructions. Talk to your pediatrician regarding the use of this medicine in children. Special care may be needed. Overdosage: If you think you have taken too much of this medicine contact a poison control center or emergency room at once. NOTE: This medicine is only for you. Do not share this medicine with others. What if I miss a dose? If you are given your dose at a clinic or doctor's office, call to reschedule your appointment. If you give your own injections and you miss a dose, take it as soon as you can. If it is almost time for your next dose, take only that dose. Do not take double or extra doses. What may interact with this medicine? -colchicine -heavy alcohol intake This list may not describe all possible  interactions. Give your health care provider a list of all the medicines, herbs, non-prescription drugs, or dietary supplements you use. Also tell them if you smoke, drink alcohol, or use illegal drugs. Some items may interact with your medicine. What should I watch for while using this medicine? Visit your doctor or health care professional regularly. You may need blood work done while you are taking this medicine. You may need to follow a special diet. Talk to your doctor. Limit your alcohol intake and avoid smoking to get the best benefit. What side effects may I notice from receiving this medicine? Side effects that you should report to your doctor or health care professional as soon as possible: -allergic reactions like skin rash, itching or hives, swelling of the face, lips, or tongue -blue tint to skin -chest tightness, pain -difficulty breathing, wheezing -dizziness -red, swollen painful area on the leg Side effects that usually do not require medical attention (report to your doctor or health care professional if they continue or are bothersome): -diarrhea -headache This list may not describe all possible side effects. Call your doctor for medical advice about side effects. You may report side effects to FDA at 1-800-FDA-1088. Where should I keep my medicine? Keep out of the reach of children. Store at room temperature between 15 and 30 degrees C (59 and 85 degrees F). Protect from light. Throw away any unused medicine after the expiration date. NOTE: This sheet is a summary. It may not cover all possible information. If you have questions about this medicine, talk to your doctor, pharmacist, or   health care provider.  2018 Elsevier/Gold Standard (2007-09-28 22:10:20) Ferumoxytol injection What is this medicine? FERUMOXYTOL is an iron complex. Iron is used to make healthy red blood cells, which carry oxygen and nutrients throughout the body. This medicine is used to treat iron  deficiency anemia in people with chronic kidney disease. This medicine may be used for other purposes; ask your health care provider or pharmacist if you have questions. COMMON BRAND NAME(S): Feraheme What should I tell my health care provider before I take this medicine? They need to know if you have any of these conditions: -anemia not caused by low iron levels -high levels of iron in the blood -magnetic resonance imaging (MRI) test scheduled -an unusual or allergic reaction to iron, other medicines, foods, dyes, or preservatives -pregnant or trying to get pregnant -breast-feeding How should I use this medicine? This medicine is for injection into a vein. It is given by a health care professional in a hospital or clinic setting. Talk to your pediatrician regarding the use of this medicine in children. Special care may be needed. Overdosage: If you think you have taken too much of this medicine contact a poison control center or emergency room at once. NOTE: This medicine is only for you. Do not share this medicine with others. What if I miss a dose? It is important not to miss your dose. Call your doctor or health care professional if you are unable to keep an appointment. What may interact with this medicine? This medicine may interact with the following medications: -other iron products This list may not describe all possible interactions. Give your health care provider a list of all the medicines, herbs, non-prescription drugs, or dietary supplements you use. Also tell them if you smoke, drink alcohol, or use illegal drugs. Some items may interact with your medicine. What should I watch for while using this medicine? Visit your doctor or healthcare professional regularly. Tell your doctor or healthcare professional if your symptoms do not start to get better or if they get worse. You may need blood work done while you are taking this medicine. You may need to follow a special diet. Talk  to your doctor. Foods that contain iron include: whole grains/cereals, dried fruits, beans, or peas, leafy green vegetables, and organ meats (liver, kidney). What side effects may I notice from receiving this medicine? Side effects that you should report to your doctor or health care professional as soon as possible: -allergic reactions like skin rash, itching or hives, swelling of the face, lips, or tongue -breathing problems -changes in blood pressure -feeling faint or lightheaded, falls -fever or chills -flushing, sweating, or hot feelings -swelling of the ankles or feet Side effects that usually do not require medical attention (report to your doctor or health care professional if they continue or are bothersome): -diarrhea -headache -nausea, vomiting -stomach pain This list may not describe all possible side effects. Call your doctor for medical advice about side effects. You may report side effects to FDA at 1-800-FDA-1088. Where should I keep my medicine? This drug is given in a hospital or clinic and will not be stored at home. NOTE: This sheet is a summary. It may not cover all possible information. If you have questions about this medicine, talk to your doctor, pharmacist, or health care provider.  2018 Elsevier/Gold Standard (2015-07-20 12:41:49)

## 2017-09-08 NOTE — Anesthesia Preprocedure Evaluation (Addendum)
Anesthesia Evaluation  Patient identified by MRN, date of birth, ID band Patient awake    Reviewed: Allergy & Precautions, NPO status , Patient's Chart, lab work & pertinent test results  Airway Mallampati: III  TM Distance: >3 FB Neck ROM: Full    Dental  (+) Partial Upper   Pulmonary sleep apnea and Continuous Positive Airway Pressure Ventilation ,    Pulmonary exam normal breath sounds clear to auscultation       Cardiovascular hypertension, + CAD and + CABG  + Valvular Problems/Murmurs AS  Rhythm:Regular Rate:Normal + Systolic murmurs ECG: SB, rate 55  ECHO: Left ventricle: The cavity size was mildly dilated. There was mild concentric hypertrophy. Systolic function was mildly reduced. The estimated ejection fraction was in the range of 45% to 50%. Features are consistent with a pseudonormal left ventricular filling pattern, with concomitant abnormal relaxation and increased filling pressure (grade 2 diastolic dysfunction). Aortic valve: There was severe stenosis. Mean gradient (S): 54 mm Hg. Valve area (VTI): 0.76 cm^2. Mitral valve: Mildly calcified annulus. There was mild regurgitation. Left atrium: The atrium was moderately dilated. Right atrium: The atrium was mildly dilated. Pulmonary arteries: Systolic pressure could not be accurately estimated.     Neuro/Psych negative neurological ROS  negative psych ROS   GI/Hepatic Neg liver ROS, GERD  Medicated and Controlled,  Endo/Other  diabetes, Oral Hypoglycemic Agents  Renal/GU negative Renal ROS     Musculoskeletal Gout   Abdominal (+) + obese,   Peds  Hematology  (+) anemia , HLD thrombocytopenia   Anesthesia Other Findings   Reproductive/Obstetrics                            Anesthesia Physical Anesthesia Plan  ASA: IV  Anesthesia Plan: MAC   Post-op Pain Management:    Induction: Intravenous  PONV Risk Score and Plan: 1  and Treatment may vary due to age or medical condition and Ondansetron  Airway Management Planned: Natural Airway and Simple Face Mask  Additional Equipment: Arterial line, CVP and Ultrasound Guidance Line Placement  Intra-op Plan:   Post-operative Plan:   Informed Consent: I have reviewed the patients History and Physical, chart, labs and discussed the procedure including the risks, benefits and alternatives for the proposed anesthesia with the patient or authorized representative who has indicated his/her understanding and acceptance.   Dental advisory given  Plan Discussed with: CRNA  Anesthesia Plan Comments:        Anesthesia Quick Evaluation

## 2017-09-09 ENCOUNTER — Other Ambulatory Visit: Payer: Self-pay

## 2017-09-09 ENCOUNTER — Inpatient Hospital Stay (HOSPITAL_COMMUNITY): Payer: Medicare Other

## 2017-09-09 ENCOUNTER — Inpatient Hospital Stay (HOSPITAL_COMMUNITY)
Admission: RE | Admit: 2017-09-09 | Discharge: 2017-09-12 | DRG: 266 | Disposition: A | Discharge status: Home / Self Care | Payer: Medicare Other | Source: Ambulatory Visit | Attending: Cardiovascular Disease | Admitting: Cardiovascular Disease

## 2017-09-09 ENCOUNTER — Encounter (HOSPITAL_COMMUNITY): Payer: Self-pay | Admitting: General Practice

## 2017-09-09 ENCOUNTER — Inpatient Hospital Stay (HOSPITAL_COMMUNITY): Payer: Medicare Other | Admitting: Emergency Medicine

## 2017-09-09 ENCOUNTER — Inpatient Hospital Stay (HOSPITAL_COMMUNITY): Payer: Medicare Other | Admitting: Certified Registered Nurse Anesthetist

## 2017-09-09 ENCOUNTER — Encounter (HOSPITAL_COMMUNITY)
Admission: RE | Disposition: A | Discharge status: Home / Self Care | Payer: Self-pay | Source: Ambulatory Visit | Attending: Cardiovascular Disease

## 2017-09-09 DIAGNOSIS — I700 Atherosclerosis of aorta: Secondary | ICD-10-CM | POA: Diagnosis present

## 2017-09-09 DIAGNOSIS — D61818 Other pancytopenia: Secondary | ICD-10-CM | POA: Diagnosis not present

## 2017-09-09 DIAGNOSIS — Z950 Presence of cardiac pacemaker: Secondary | ICD-10-CM | POA: Diagnosis not present

## 2017-09-09 DIAGNOSIS — G4733 Obstructive sleep apnea (adult) (pediatric): Secondary | ICD-10-CM | POA: Diagnosis not present

## 2017-09-09 DIAGNOSIS — I361 Nonrheumatic tricuspid (valve) insufficiency: Secondary | ICD-10-CM | POA: Diagnosis not present

## 2017-09-09 DIAGNOSIS — Z7984 Long term (current) use of oral hypoglycemic drugs: Secondary | ICD-10-CM

## 2017-09-09 DIAGNOSIS — Z9221 Personal history of antineoplastic chemotherapy: Secondary | ICD-10-CM

## 2017-09-09 DIAGNOSIS — C8330 Diffuse large B-cell lymphoma, unspecified site: Secondary | ICD-10-CM | POA: Diagnosis not present

## 2017-09-09 DIAGNOSIS — D649 Anemia, unspecified: Secondary | ICD-10-CM | POA: Diagnosis not present

## 2017-09-09 DIAGNOSIS — R0902 Hypoxemia: Secondary | ICD-10-CM | POA: Diagnosis not present

## 2017-09-09 DIAGNOSIS — Z953 Presence of xenogenic heart valve: Secondary | ICD-10-CM | POA: Diagnosis not present

## 2017-09-09 DIAGNOSIS — Z961 Presence of intraocular lens: Secondary | ICD-10-CM | POA: Diagnosis present

## 2017-09-09 DIAGNOSIS — Z7982 Long term (current) use of aspirin: Secondary | ICD-10-CM

## 2017-09-09 DIAGNOSIS — Z9049 Acquired absence of other specified parts of digestive tract: Secondary | ICD-10-CM

## 2017-09-09 DIAGNOSIS — Z85828 Personal history of other malignant neoplasm of skin: Secondary | ICD-10-CM | POA: Diagnosis not present

## 2017-09-09 DIAGNOSIS — E538 Deficiency of other specified B group vitamins: Secondary | ICD-10-CM | POA: Diagnosis present

## 2017-09-09 DIAGNOSIS — I442 Atrioventricular block, complete: Secondary | ICD-10-CM

## 2017-09-09 DIAGNOSIS — I350 Nonrheumatic aortic (valve) stenosis: Secondary | ICD-10-CM | POA: Diagnosis not present

## 2017-09-09 DIAGNOSIS — I459 Conduction disorder, unspecified: Secondary | ICD-10-CM | POA: Diagnosis not present

## 2017-09-09 DIAGNOSIS — Z800 Family history of malignant neoplasm of digestive organs: Secondary | ICD-10-CM

## 2017-09-09 DIAGNOSIS — I5033 Acute on chronic diastolic (congestive) heart failure: Secondary | ICD-10-CM

## 2017-09-09 DIAGNOSIS — M109 Gout, unspecified: Secondary | ICD-10-CM | POA: Diagnosis present

## 2017-09-09 DIAGNOSIS — Z006 Encounter for examination for normal comparison and control in clinical research program: Secondary | ICD-10-CM | POA: Diagnosis not present

## 2017-09-09 DIAGNOSIS — D72819 Decreased white blood cell count, unspecified: Secondary | ICD-10-CM | POA: Diagnosis not present

## 2017-09-09 DIAGNOSIS — E119 Type 2 diabetes mellitus without complications: Secondary | ICD-10-CM | POA: Diagnosis present

## 2017-09-09 DIAGNOSIS — M7989 Other specified soft tissue disorders: Secondary | ICD-10-CM

## 2017-09-09 DIAGNOSIS — I480 Paroxysmal atrial fibrillation: Secondary | ICD-10-CM | POA: Diagnosis not present

## 2017-09-09 DIAGNOSIS — I2510 Atherosclerotic heart disease of native coronary artery without angina pectoris: Secondary | ICD-10-CM | POA: Diagnosis not present

## 2017-09-09 DIAGNOSIS — Z886 Allergy status to analgesic agent status: Secondary | ICD-10-CM | POA: Diagnosis not present

## 2017-09-09 DIAGNOSIS — Z888 Allergy status to other drugs, medicaments and biological substances status: Secondary | ICD-10-CM

## 2017-09-09 DIAGNOSIS — I517 Cardiomegaly: Secondary | ICD-10-CM | POA: Diagnosis not present

## 2017-09-09 DIAGNOSIS — Z955 Presence of coronary angioplasty implant and graft: Secondary | ICD-10-CM

## 2017-09-09 DIAGNOSIS — Z959 Presence of cardiac and vascular implant and graft, unspecified: Secondary | ICD-10-CM

## 2017-09-09 DIAGNOSIS — Z6833 Body mass index (BMI) 33.0-33.9, adult: Secondary | ICD-10-CM

## 2017-09-09 DIAGNOSIS — I110 Hypertensive heart disease with heart failure: Secondary | ICD-10-CM | POA: Diagnosis not present

## 2017-09-09 DIAGNOSIS — E669 Obesity, unspecified: Secondary | ICD-10-CM | POA: Diagnosis present

## 2017-09-09 DIAGNOSIS — Z87892 Personal history of anaphylaxis: Secondary | ICD-10-CM

## 2017-09-09 DIAGNOSIS — K219 Gastro-esophageal reflux disease without esophagitis: Secondary | ICD-10-CM | POA: Diagnosis present

## 2017-09-09 DIAGNOSIS — E611 Iron deficiency: Secondary | ICD-10-CM | POA: Diagnosis present

## 2017-09-09 DIAGNOSIS — C8334 Diffuse large B-cell lymphoma, lymph nodes of axilla and upper limb: Secondary | ICD-10-CM | POA: Diagnosis present

## 2017-09-09 DIAGNOSIS — J811 Chronic pulmonary edema: Secondary | ICD-10-CM | POA: Diagnosis not present

## 2017-09-09 DIAGNOSIS — Z9841 Cataract extraction status, right eye: Secondary | ICD-10-CM

## 2017-09-09 DIAGNOSIS — E739 Lactose intolerance, unspecified: Secondary | ICD-10-CM | POA: Diagnosis present

## 2017-09-09 DIAGNOSIS — I25810 Atherosclerosis of coronary artery bypass graft(s) without angina pectoris: Secondary | ICD-10-CM | POA: Diagnosis present

## 2017-09-09 DIAGNOSIS — Z7951 Long term (current) use of inhaled steroids: Secondary | ICD-10-CM

## 2017-09-09 DIAGNOSIS — E782 Mixed hyperlipidemia: Secondary | ICD-10-CM | POA: Diagnosis present

## 2017-09-09 DIAGNOSIS — Z833 Family history of diabetes mellitus: Secondary | ICD-10-CM

## 2017-09-09 DIAGNOSIS — R001 Bradycardia, unspecified: Secondary | ICD-10-CM | POA: Diagnosis not present

## 2017-09-09 DIAGNOSIS — Z9842 Cataract extraction status, left eye: Secondary | ICD-10-CM

## 2017-09-09 DIAGNOSIS — Z8249 Family history of ischemic heart disease and other diseases of the circulatory system: Secondary | ICD-10-CM

## 2017-09-09 HISTORY — PX: INTRAOPERATIVE TRANSTHORACIC ECHOCARDIOGRAM: SHX6523

## 2017-09-09 HISTORY — PX: TRANSCATHETER AORTIC VALVE REPLACEMENT, TRANSFEMORAL: SHX6400

## 2017-09-09 LAB — POCT I-STAT, CHEM 8
BUN: 11 mg/dL (ref 6–20)
BUN: 12 mg/dL (ref 6–20)
BUN: 12 mg/dL (ref 6–20)
CALCIUM ION: 1.13 mmol/L — AB (ref 1.15–1.40)
CHLORIDE: 100 mmol/L — AB (ref 101–111)
CREATININE: 0.8 mg/dL (ref 0.61–1.24)
CREATININE: 0.9 mg/dL (ref 0.61–1.24)
Calcium, Ion: 1.14 mmol/L — ABNORMAL LOW (ref 1.15–1.40)
Calcium, Ion: 1.16 mmol/L (ref 1.15–1.40)
Chloride: 98 mmol/L — ABNORMAL LOW (ref 101–111)
Chloride: 99 mmol/L — ABNORMAL LOW (ref 101–111)
Creatinine, Ser: 0.8 mg/dL (ref 0.61–1.24)
GLUCOSE: 206 mg/dL — AB (ref 65–99)
GLUCOSE: 209 mg/dL — AB (ref 65–99)
Glucose, Bld: 135 mg/dL — ABNORMAL HIGH (ref 65–99)
HCT: 25 % — ABNORMAL LOW (ref 39.0–52.0)
HEMATOCRIT: 27 % — AB (ref 39.0–52.0)
HEMATOCRIT: 27 % — AB (ref 39.0–52.0)
Hemoglobin: 9.2 g/dL — ABNORMAL LOW (ref 13.0–17.0)
Hemoglobin: 8.5 g/dL — ABNORMAL LOW (ref 13.0–17.0)
Hemoglobin: 9.2 g/dL — ABNORMAL LOW (ref 13.0–17.0)
POTASSIUM: 3.8 mmol/L (ref 3.5–5.1)
POTASSIUM: 4.1 mmol/L (ref 3.5–5.1)
Potassium: 3.6 mmol/L (ref 3.5–5.1)
SODIUM: 138 mmol/L (ref 135–145)
Sodium: 137 mmol/L (ref 135–145)
Sodium: 137 mmol/L (ref 135–145)
TCO2: 24 mmol/L (ref 22–32)
TCO2: 23 mmol/L (ref 22–32)
TCO2: 25 mmol/L (ref 22–32)

## 2017-09-09 LAB — CBC
HEMATOCRIT: 29.4 % — AB (ref 39.0–52.0)
HEMOGLOBIN: 8.6 g/dL — AB (ref 13.0–17.0)
MCH: 26.9 pg (ref 26.0–34.0)
MCHC: 29.3 g/dL — AB (ref 30.0–36.0)
MCV: 91.9 fL (ref 78.0–100.0)
Platelets: 91 10*3/uL — ABNORMAL LOW (ref 150–400)
RBC: 3.2 MIL/uL — ABNORMAL LOW (ref 4.22–5.81)
RDW: 18.4 % — AB (ref 11.5–15.5)
WBC: 3.2 10*3/uL — AB (ref 4.0–10.5)

## 2017-09-09 LAB — POCT I-STAT 4, (NA,K, GLUC, HGB,HCT)
GLUCOSE: 177 mg/dL — AB (ref 65–99)
HCT: 27 % — ABNORMAL LOW (ref 39.0–52.0)
Hemoglobin: 9.2 g/dL — ABNORMAL LOW (ref 13.0–17.0)
POTASSIUM: 3.8 mmol/L (ref 3.5–5.1)
Sodium: 138 mmol/L (ref 135–145)

## 2017-09-09 LAB — PROTIME-INR
INR: 1.45
PROTHROMBIN TIME: 17.6 s — AB (ref 11.4–15.2)

## 2017-09-09 LAB — TYPE AND SCREEN
ABO/RH(D): A POS
Antibody Screen: NEGATIVE

## 2017-09-09 LAB — APTT: APTT: 37 s — AB (ref 24–36)

## 2017-09-09 LAB — GLUCOSE, CAPILLARY: Glucose-Capillary: 124 mg/dL — ABNORMAL HIGH (ref 65–99)

## 2017-09-09 LAB — ABO/RH: ABO/RH(D): A POS

## 2017-09-09 SURGERY — IMPLANTATION, AORTIC VALVE, TRANSCATHETER, FEMORAL APPROACH
Anesthesia: Monitor Anesthesia Care | Site: Chest

## 2017-09-09 MED ORDER — FENTANYL CITRATE (PF) 100 MCG/2ML IJ SOLN
INTRAMUSCULAR | Status: DC | PRN
  Administered 2017-09-09: 50 ug via INTRAVENOUS

## 2017-09-09 MED ORDER — POTASSIUM CHLORIDE 10 MEQ/50ML IV SOLN
10.0000 meq | INTRAVENOUS | Status: AC
  Administered 2017-09-09 (×3): 10 meq via INTRAVENOUS
  Filled 2017-09-09 (×3): qty 50

## 2017-09-09 MED ORDER — GLIMEPIRIDE 4 MG PO TABS
2.0000 mg | ORAL_TABLET | Freq: Every day | ORAL | Status: DC
  Administered 2017-09-11 – 2017-09-12 (×2): 2 mg via ORAL
  Filled 2017-09-09 (×3): qty 1

## 2017-09-09 MED ORDER — IODIXANOL 320 MG/ML IV SOLN
INTRAVENOUS | Status: DC | PRN
  Administered 2017-09-09: 80.9 mL via INTRAVENOUS

## 2017-09-09 MED ORDER — ONDANSETRON HCL 4 MG/2ML IJ SOLN
INTRAMUSCULAR | Status: AC
  Filled 2017-09-09: qty 2

## 2017-09-09 MED ORDER — ALLOPURINOL 300 MG PO TABS
450.0000 mg | ORAL_TABLET | Freq: Every day | ORAL | Status: DC
  Administered 2017-09-10 – 2017-09-12 (×3): 450 mg via ORAL
  Filled 2017-09-09: qty 1
  Filled 2017-09-09 (×2): qty 2

## 2017-09-09 MED ORDER — PANTOPRAZOLE SODIUM 40 MG PO TBEC: 40.0000 mg | DELAYED_RELEASE_TABLET | Freq: Every day | ORAL | Status: DC

## 2017-09-09 MED ORDER — SODIUM CHLORIDE 0.9 % IV SOLN: INTRAVENOUS | Status: DC

## 2017-09-09 MED ORDER — PROTAMINE SULFATE 10 MG/ML IV SOLN
INTRAVENOUS | Status: DC | PRN
  Administered 2017-09-09: 160 mg via INTRAVENOUS

## 2017-09-09 MED ORDER — PANTOPRAZOLE SODIUM 40 MG PO TBEC
40.0000 mg | DELAYED_RELEASE_TABLET | Freq: Every day | ORAL | Status: DC
  Administered 2017-09-10 – 2017-09-12 (×3): 40 mg via ORAL
  Filled 2017-09-09 (×3): qty 1

## 2017-09-09 MED ORDER — ONDANSETRON HCL 4 MG/2ML IJ SOLN: 4.0000 mg | Freq: Four times a day (QID) | INTRAMUSCULAR | Status: DC | PRN

## 2017-09-09 MED ORDER — LIDOCAINE HCL (PF) 1 % IJ SOLN
INTRAMUSCULAR | Status: AC
  Filled 2017-09-09: qty 30

## 2017-09-09 MED ORDER — MIDAZOLAM HCL 2 MG/2ML IJ SOLN: 2.0000 mg | INTRAMUSCULAR | Status: DC | PRN

## 2017-09-09 MED ORDER — SODIUM CHLORIDE 0.9% FLUSH: 10.0000 mL | INTRAVENOUS | Status: DC | PRN

## 2017-09-09 MED ORDER — CHLORHEXIDINE GLUCONATE 4 % EX LIQD: 30.0000 mL | CUTANEOUS | Status: DC

## 2017-09-09 MED ORDER — CEFAZOLIN SODIUM-DEXTROSE 2-4 GM/100ML-% IV SOLN
2.0000 g | Freq: Three times a day (TID) | INTRAVENOUS | Status: DC
  Administered 2017-09-09 – 2017-09-10 (×4): 2 g via INTRAVENOUS
  Filled 2017-09-09 (×6): qty 100

## 2017-09-09 MED ORDER — ACETAMINOPHEN 500 MG PO TABS
1000.0000 mg | ORAL_TABLET | Freq: Four times a day (QID) | ORAL | Status: DC
  Administered 2017-09-10 – 2017-09-12 (×11): 1000 mg via ORAL
  Filled 2017-09-09 (×11): qty 2

## 2017-09-09 MED ORDER — LIDOCAINE HCL (CARDIAC) 20 MG/ML IV SOLN
INTRAVENOUS | Status: AC
  Filled 2017-09-09: qty 5

## 2017-09-09 MED ORDER — ACETAMINOPHEN 160 MG/5ML PO SOLN: 650.0000 mg | Freq: Once | ORAL | Status: DC

## 2017-09-09 MED ORDER — MORPHINE SULFATE (PF) 2 MG/ML IV SOLN: 2.0000 mg | INTRAVENOUS | Status: DC | PRN

## 2017-09-09 MED ORDER — ASPIRIN EC 81 MG PO TBEC
81.0000 mg | DELAYED_RELEASE_TABLET | Freq: Every day | ORAL | Status: DC
  Administered 2017-09-10 – 2017-09-12 (×3): 81 mg via ORAL
  Filled 2017-09-09 (×3): qty 1

## 2017-09-09 MED ORDER — FLUTICASONE PROPIONATE 50 MCG/ACT NA SUSP
1.0000 | Freq: Two times a day (BID) | NASAL | Status: DC
  Administered 2017-09-09 – 2017-09-12 (×6): 1 via NASAL
  Filled 2017-09-09: qty 16

## 2017-09-09 MED ORDER — TRAMADOL HCL 50 MG PO TABS
50.0000 mg | ORAL_TABLET | ORAL | Status: DC | PRN
  Administered 2017-09-10: 100 mg via ORAL
  Filled 2017-09-09: qty 2

## 2017-09-09 MED ORDER — HEPARIN SODIUM (PORCINE) 1000 UNIT/ML IJ SOLN
INTRAMUSCULAR | Status: AC
  Filled 2017-09-09: qty 4

## 2017-09-09 MED ORDER — ACETAMINOPHEN 325 MG PO TABS
650.0000 mg | ORAL_TABLET | Freq: Once | ORAL | Status: AC
  Administered 2017-09-09: 650 mg via ORAL
  Filled 2017-09-09: qty 2

## 2017-09-09 MED ORDER — OXYCODONE HCL 5 MG PO TABS
5.0000 mg | ORAL_TABLET | ORAL | Status: DC | PRN
  Administered 2017-09-10: 10 mg via ORAL
  Administered 2017-09-10: 5 mg via ORAL
  Filled 2017-09-09: qty 2
  Filled 2017-09-09: qty 1

## 2017-09-09 MED ORDER — SODIUM CHLORIDE 0.9 % IV SOLN
INTRAVENOUS | Status: AC
  Administered 2017-09-09: 10:00:00 via INTRAVENOUS

## 2017-09-09 MED ORDER — CHLORHEXIDINE GLUCONATE 4 % EX LIQD: 60.0000 mL | Freq: Once | CUTANEOUS | Status: DC

## 2017-09-09 MED ORDER — PHENYLEPHRINE 40 MCG/ML (10ML) SYRINGE FOR IV PUSH (FOR BLOOD PRESSURE SUPPORT)
PREFILLED_SYRINGE | INTRAVENOUS | Status: AC
  Filled 2017-09-09: qty 10

## 2017-09-09 MED ORDER — CHLORHEXIDINE GLUCONATE CLOTH 2 % EX PADS: 6.0000 | MEDICATED_PAD | Freq: Every day | CUTANEOUS | Status: DC

## 2017-09-09 MED ORDER — SODIUM CHLORIDE 0.9 % IJ SOLN
INTRAMUSCULAR | Status: AC
  Filled 2017-09-09: qty 10

## 2017-09-09 MED ORDER — CLOPIDOGREL BISULFATE 75 MG PO TABS
75.0000 mg | ORAL_TABLET | Freq: Every day | ORAL | Status: DC
  Administered 2017-09-10: 75 mg via ORAL
  Filled 2017-09-09: qty 1

## 2017-09-09 MED ORDER — VALACYCLOVIR HCL 500 MG PO TABS
1000.0000 mg | ORAL_TABLET | Freq: Every day | ORAL | Status: DC
  Administered 2017-09-09 – 2017-09-11 (×3): 1000 mg via ORAL
  Filled 2017-09-09 (×4): qty 2

## 2017-09-09 MED ORDER — PANCRELIPASE (LIP-PROT-AMYL) 12000-38000 UNITS PO CPEP
72000.0000 [IU] | ORAL_CAPSULE | Freq: Three times a day (TID) | ORAL | Status: DC
  Administered 2017-09-09 – 2017-09-12 (×10): 72000 [IU] via ORAL
  Filled 2017-09-09: qty 2
  Filled 2017-09-09 (×2): qty 6
  Filled 2017-09-09 (×4): qty 2
  Filled 2017-09-09: qty 6
  Filled 2017-09-09: qty 2
  Filled 2017-09-09: qty 6
  Filled 2017-09-09: qty 2

## 2017-09-09 MED ORDER — LIDOCAINE HCL (PF) 1 % IJ SOLN
INTRAMUSCULAR | Status: DC | PRN
Start: 2017-09-09 — End: 2017-09-09
  Administered 2017-09-09: 20 mL

## 2017-09-09 MED ORDER — NOREPINEPHRINE BITARTRATE 1 MG/ML IV SOLN
INTRAVENOUS | Status: DC | PRN
  Administered 2017-09-09: 1 ug/min via INTRAVENOUS

## 2017-09-09 MED ORDER — DEXMEDETOMIDINE HCL 200 MCG/2ML IV SOLN
INTRAVENOUS | Status: DC | PRN
  Administered 2017-09-09: 111.7 ug via INTRAVENOUS

## 2017-09-09 MED ORDER — ORAL CARE MOUTH RINSE
15.0000 mL | Freq: Two times a day (BID) | OROMUCOSAL | Status: DC
  Administered 2017-09-09: 15 mL via OROMUCOSAL

## 2017-09-09 MED ORDER — SODIUM CHLORIDE 0.9 % IV SOLN
INTRAVENOUS | Status: DC | PRN
  Administered 2017-09-09 (×3): 500 mL

## 2017-09-09 MED ORDER — FAMOTIDINE IN NACL 20-0.9 MG/50ML-% IV SOLN
20.0000 mg | Freq: Two times a day (BID) | INTRAVENOUS | Status: DC
  Administered 2017-09-09: 20 mg via INTRAVENOUS
  Filled 2017-09-09: qty 50

## 2017-09-09 MED ORDER — ONDANSETRON HCL 4 MG/2ML IJ SOLN
INTRAMUSCULAR | Status: DC | PRN
  Administered 2017-09-09: 4 mg via INTRAVENOUS

## 2017-09-09 MED ORDER — VANCOMYCIN HCL IN DEXTROSE 1-5 GM/200ML-% IV SOLN
1000.0000 mg | Freq: Once | INTRAVENOUS | Status: AC
  Administered 2017-09-09: 1000 mg via INTRAVENOUS
  Filled 2017-09-09: qty 200

## 2017-09-09 MED ORDER — LACTATED RINGERS IV SOLN
INTRAVENOUS | Status: DC | PRN
  Administered 2017-09-09: 07:00:00 via INTRAVENOUS

## 2017-09-09 MED ORDER — ACETAMINOPHEN 650 MG RE SUPP: 650.0000 mg | Freq: Once | RECTAL | Status: DC

## 2017-09-09 MED ORDER — LACTATED RINGERS IV SOLN: 500.0000 mL | Freq: Once | INTRAVENOUS | Status: DC | PRN

## 2017-09-09 MED ORDER — PROTAMINE SULFATE 10 MG/ML IV SOLN
INTRAVENOUS | Status: AC
  Filled 2017-09-09: qty 25

## 2017-09-09 MED ORDER — ACETAMINOPHEN 160 MG/5ML PO SOLN: 1000.0000 mg | Freq: Four times a day (QID) | ORAL | Status: DC

## 2017-09-09 MED ORDER — GUAIFENESIN ER 600 MG PO TB12
600.0000 mg | ORAL_TABLET | Freq: Two times a day (BID) | ORAL | Status: DC
  Administered 2017-09-09 – 2017-09-12 (×6): 600 mg via ORAL
  Filled 2017-09-09 (×6): qty 1

## 2017-09-09 MED ORDER — PREGABALIN 75 MG PO CAPS
75.0000 mg | ORAL_CAPSULE | Freq: Three times a day (TID) | ORAL | Status: DC
  Administered 2017-09-09 – 2017-09-12 (×10): 75 mg via ORAL
  Filled 2017-09-09 (×10): qty 1

## 2017-09-09 MED ORDER — 0.9 % SODIUM CHLORIDE (POUR BTL) OPTIME
TOPICAL | Status: DC | PRN
  Administered 2017-09-09 (×3): 1000 mL

## 2017-09-09 MED ORDER — AMLODIPINE BESYLATE 5 MG PO TABS
5.0000 mg | ORAL_TABLET | Freq: Two times a day (BID) | ORAL | Status: DC
  Administered 2017-09-09 – 2017-09-12 (×6): 5 mg via ORAL
  Filled 2017-09-09 (×6): qty 1

## 2017-09-09 MED ORDER — FENTANYL CITRATE (PF) 250 MCG/5ML IJ SOLN
INTRAMUSCULAR | Status: AC
  Filled 2017-09-09: qty 5

## 2017-09-09 MED ORDER — CHLORHEXIDINE GLUCONATE 0.12 % MT SOLN
15.0000 mL | Freq: Once | OROMUCOSAL | Status: AC
  Administered 2017-09-09: 15 mL via OROMUCOSAL
  Filled 2017-09-09: qty 15

## 2017-09-09 MED ORDER — METOPROLOL TARTRATE 5 MG/5ML IV SOLN: 2.5000 mg | INTRAVENOUS | Status: DC | PRN

## 2017-09-09 MED ORDER — FUROSEMIDE 80 MG PO TABS
120.0000 mg | ORAL_TABLET | Freq: Two times a day (BID) | ORAL | Status: DC
  Administered 2017-09-09: 120 mg via ORAL
  Filled 2017-09-09: qty 1

## 2017-09-09 MED ORDER — MIDAZOLAM HCL 2 MG/2ML IJ SOLN
INTRAMUSCULAR | Status: AC
  Filled 2017-09-09: qty 2

## 2017-09-09 MED ORDER — DEXMEDETOMIDINE HCL IN NACL 200 MCG/50ML IV SOLN
INTRAVENOUS | Status: DC | PRN
  Administered 2017-09-09: 1 ug/kg/h via INTRAVENOUS

## 2017-09-09 MED ORDER — NOREPINEPHRINE BITARTRATE 1 MG/ML IV SOLN
0.0000 ug/min | INTRAVENOUS | Status: DC
  Filled 2017-09-09: qty 4

## 2017-09-09 MED ORDER — ATORVASTATIN CALCIUM 80 MG PO TABS
80.0000 mg | ORAL_TABLET | Freq: Every day | ORAL | Status: DC
  Administered 2017-09-09 – 2017-09-11 (×3): 80 mg via ORAL
  Filled 2017-09-09 (×3): qty 1

## 2017-09-09 MED ORDER — PROPOFOL 500 MG/50ML IV EMUL
INTRAVENOUS | Status: DC | PRN
  Administered 2017-09-09: 10 ug/kg/min via INTRAVENOUS

## 2017-09-09 MED ORDER — SODIUM CHLORIDE 0.9% FLUSH
10.0000 mL | Freq: Two times a day (BID) | INTRAVENOUS | Status: DC
  Administered 2017-09-09: 10 mL

## 2017-09-09 MED ORDER — POTASSIUM CHLORIDE CRYS ER 20 MEQ PO TBCR
40.0000 meq | EXTENDED_RELEASE_TABLET | Freq: Two times a day (BID) | ORAL | Status: DC
  Administered 2017-09-09 – 2017-09-12 (×6): 40 meq via ORAL
  Filled 2017-09-09 (×6): qty 2

## 2017-09-09 MED ORDER — ALBUMIN HUMAN 5 % IV SOLN: 250.0000 mL | INTRAVENOUS | Status: AC | PRN

## 2017-09-09 MED ORDER — PROPOFOL 10 MG/ML IV BOLUS
INTRAVENOUS | Status: AC
  Filled 2017-09-09: qty 20

## 2017-09-09 MED ORDER — SODIUM CHLORIDE 0.9 % IV SOLN
INTRAVENOUS | Status: DC
  Administered 2017-09-09: 07:00:00 via INTRAVENOUS

## 2017-09-09 MED ORDER — HEPARIN SODIUM (PORCINE) 1000 UNIT/ML IJ SOLN
INTRAMUSCULAR | Status: DC | PRN
  Administered 2017-09-09: 16000 [IU] via INTRAVENOUS

## 2017-09-09 MED ORDER — EZETIMIBE 10 MG PO TABS
10.0000 mg | ORAL_TABLET | Freq: Every day | ORAL | Status: DC
  Administered 2017-09-10 – 2017-09-12 (×3): 10 mg via ORAL
  Filled 2017-09-09 (×3): qty 1

## 2017-09-09 SURGICAL SUPPLY — 97 items
ADAPTER UNIV SWAN GANZ BIP (ADAPTER) ×2 IMPLANT
ADAPTER UNV SWAN GANZ BIP (ADAPTER) ×2
ADH SKN CLS APL DERMABOND .7 (GAUZE/BANDAGES/DRESSINGS) ×2
ADPR CATH UNV NS SG CATH (ADAPTER) ×2
BAG BANDED W/RUBBER/TAPE 36X54 (MISCELLANEOUS) ×4 IMPLANT
BAG DECANTER FOR FLEXI CONT (MISCELLANEOUS) IMPLANT
BAG EQP BAND 135X91 W/RBR TAPE (MISCELLANEOUS) ×2
BAG SNAP BAND KOVER 36X36 (MISCELLANEOUS) ×8 IMPLANT
BLADE CLIPPER SURG (BLADE) IMPLANT
BLADE OSCILLATING /SAGITTAL (BLADE) IMPLANT
BLADE STERNUM SYSTEM 6 (BLADE) ×1 IMPLANT
CABLE ADAPT CONN TEMP 6FT (ADAPTER) ×7 IMPLANT
CANNULA FEM VENOUS REMOTE 22FR (CANNULA) IMPLANT
CANNULA OPTISITE PERFUSION 16F (CANNULA) IMPLANT
CANNULA OPTISITE PERFUSION 18F (CANNULA) IMPLANT
CATH DIAG EXPO 6F VENT PIG 145 (CATHETERS) ×14 IMPLANT
CATH EXPO 5FR AL1 (CATHETERS) ×4 IMPLANT
CATH INFINITI 6F AL2 (CATHETERS) ×3 IMPLANT
CATH S G BIP PACING (SET/KITS/TRAYS/PACK) ×8 IMPLANT
CLIP VESOCCLUDE MED 24/CT (CLIP) IMPLANT
CLIP VESOCCLUDE SM WIDE 24/CT (CLIP) IMPLANT
CONT SPEC 4OZ CLIKSEAL STRL BL (MISCELLANEOUS) ×5 IMPLANT
COVER BACK TABLE 24X17X13 BIG (DRAPES) ×4 IMPLANT
COVER BACK TABLE 60X90IN (DRAPES) ×8 IMPLANT
COVER BACK TABLE 80X110 HD (DRAPES) ×4 IMPLANT
COVER DOME SNAP 22 D (MISCELLANEOUS) ×4 IMPLANT
COVER PROBE W GEL 5X96 (DRAPES) ×4 IMPLANT
CRADLE DONUT ADULT HEAD (MISCELLANEOUS) ×4 IMPLANT
DERMABOND ADVANCED (GAUZE/BANDAGES/DRESSINGS) ×2
DERMABOND ADVANCED .7 DNX12 (GAUZE/BANDAGES/DRESSINGS) ×2 IMPLANT
DEVICE CLOSURE PERCLS PRGLD 6F (VASCULAR PRODUCTS) ×6 IMPLANT
DRAPE INCISE IOBAN 66X45 STRL (DRAPES) IMPLANT
DRAPE SLUSH MACHINE 52X66 (DRAPES) ×4 IMPLANT
DRSG TEGADERM 4X4.75 (GAUZE/BANDAGES/DRESSINGS) ×4 IMPLANT
ELECT REM PT RETURN 9FT ADLT (ELECTROSURGICAL) ×8
ELECTRODE REM PT RTRN 9FT ADLT (ELECTROSURGICAL) ×4 IMPLANT
FELT TEFLON 6X6 (MISCELLANEOUS) IMPLANT
FEMORAL VENOUS CANN RAP (CANNULA) IMPLANT
GAUZE SPONGE 4X4 12PLY STRL (GAUZE/BANDAGES/DRESSINGS) ×4 IMPLANT
GLOVE BIO SURGEON STRL SZ7.5 (GLOVE) ×4 IMPLANT
GLOVE BIO SURGEON STRL SZ8 (GLOVE) ×4 IMPLANT
GLOVE EUDERMIC 7 POWDERFREE (GLOVE) ×8 IMPLANT
GLOVE ORTHO TXT STRL SZ7.5 (GLOVE) ×8 IMPLANT
GOWN STRL REUS W/ TWL LRG LVL3 (GOWN DISPOSABLE) ×6 IMPLANT
GOWN STRL REUS W/ TWL XL LVL3 (GOWN DISPOSABLE) ×8 IMPLANT
GOWN STRL REUS W/TWL LRG LVL3 (GOWN DISPOSABLE) ×12
GOWN STRL REUS W/TWL XL LVL3 (GOWN DISPOSABLE) ×16
GUIDEWIRE SAFE TJ AMPLATZ EXST (WIRE) ×7 IMPLANT
GUIDEWIRE STRAIGHT .035 260CM (WIRE) ×4 IMPLANT
INSERT FOGARTY SM (MISCELLANEOUS) IMPLANT
KIT BASIN OR (CUSTOM PROCEDURE TRAY) ×4 IMPLANT
KIT DILATOR VASC 18G NDL (KITS) IMPLANT
KIT HEART LEFT (KITS) ×7 IMPLANT
KIT ROOM TURNOVER OR (KITS) ×4 IMPLANT
KIT SUCTION CATH 14FR (SUCTIONS) ×6 IMPLANT
NDL PERC 18GX7CM (NEEDLE) ×1 IMPLANT
NEEDLE 22X1 1/2 (OR ONLY) (NEEDLE) IMPLANT
NEEDLE PERC 18GX7CM (NEEDLE) ×8 IMPLANT
NS IRRIG 1000ML POUR BTL (IV SOLUTION) ×12 IMPLANT
PACK AORTA (CUSTOM PROCEDURE TRAY) ×4 IMPLANT
PAD ARMBOARD 7.5X6 YLW CONV (MISCELLANEOUS) ×8 IMPLANT
PAD ELECT DEFIB RADIOL ZOLL (MISCELLANEOUS) ×4 IMPLANT
PERCLOSE PROGLIDE 6F (VASCULAR PRODUCTS) ×16
SET MICROPUNCTURE 5F STIFF (MISCELLANEOUS) ×7 IMPLANT
SHEATH BRITE TIP 6FR 35CM (SHEATH) ×3 IMPLANT
SHEATH PINNACLE 6F 10CM (SHEATH) ×11 IMPLANT
SHEATH PINNACLE 8F 10CM (SHEATH) ×7 IMPLANT
SLEEVE REPOSITIONING LENGTH 30 (MISCELLANEOUS) ×4 IMPLANT
SPONGE LAP 4X18 X RAY DECT (DISPOSABLE) ×3 IMPLANT
STOPCOCK MORSE 400PSI 3WAY (MISCELLANEOUS) ×30 IMPLANT
SUT ETHIBOND X763 2 0 SH 1 (SUTURE) ×1 IMPLANT
SUT GORETEX CV 4 TH 22 36 (SUTURE) ×1 IMPLANT
SUT GORETEX CV4 TH-18 (SUTURE) ×3 IMPLANT
SUT MNCRL AB 3-0 PS2 18 (SUTURE) ×1 IMPLANT
SUT PROLENE 5 0 C 1 36 (SUTURE) ×2 IMPLANT
SUT PROLENE 6 0 C 1 30 (SUTURE) ×2 IMPLANT
SUT SILK  1 MH (SUTURE) ×4
SUT SILK 1 MH (SUTURE) ×3 IMPLANT
SUT VIC AB 2-0 CT1 27 (SUTURE) ×4
SUT VIC AB 2-0 CT1 TAPERPNT 27 (SUTURE) ×2 IMPLANT
SUT VIC AB 2-0 CTX 36 (SUTURE) IMPLANT
SUT VIC AB 3-0 SH 8-18 (SUTURE) ×8 IMPLANT
SYR 10ML LL (SYRINGE) ×12 IMPLANT
SYR 30ML LL (SYRINGE) ×8 IMPLANT
SYR 50ML LL SCALE MARK (SYRINGE) ×4 IMPLANT
SYR CONTROL 10ML LL (SYRINGE) IMPLANT
TAPE CLOTH SURG 4X10 WHT LF (GAUZE/BANDAGES/DRESSINGS) ×3 IMPLANT
TOWEL GREEN STERILE (TOWEL DISPOSABLE) ×8 IMPLANT
TRANSDUCER DISP STR W/STOPCOCK (MISCELLANEOUS) ×6 IMPLANT
TRANSDUCER W/STOPCOCK (MISCELLANEOUS) ×8 IMPLANT
TRAY FOLEY SILVER 16FR TEMP (SET/KITS/TRAYS/PACK) ×4 IMPLANT
TUBING ART PRESS 72  MALE/FEM (TUBING) ×2
TUBING ART PRESS 72 MALE/FEM (TUBING) ×1 IMPLANT
VALVE HEART TRANSCATH SZ3 26MM (Prosthesis & Implant Heart) ×3 IMPLANT
WIRE .035 3MM-J 145CM (WIRE) ×3 IMPLANT
WIRE AMPLATZ SS-J .035X180CM (WIRE) ×7 IMPLANT
WIRE EMERALD ST .035X260CM (WIRE) ×3 IMPLANT

## 2017-09-09 NOTE — Progress Notes (Signed)
CT surgery p.m. Rounds   patient is comfortable without pain Temporary pacemaker set at 50 backup vital signs stable  No groin hematoma  continue current care

## 2017-09-09 NOTE — Op Note (Signed)
[facility]   MULTIDISCIPLINARY HEART VALVE TEAM   TAVR OPERATIVE NOTE   Date of Procedure:  09/09/2017  Preoperative Diagnosis: Severe Aortic Stenosis   Postoperative Diagnosis: Same   Procedure:    Transcatheter Aortic Valve Replacement - Percutaneous Right Transfemoral Approach  Edwards Sapien 3 THV (size 26 mm, model # 9600TFX, serial # [id number])   Co-Surgeons:  Gaye Pollack, MD and Lauree Chandler, MD    Anesthesiologist:  Perfecto Kingdom, MD  Echocardiographer:  Ena Dawley, MD  Pre-operative Echo Findings:  Severe aortic stenosis  Normal left ventricular systolic function  Post-operative Echo Findings:  No paravalvular leak  Normal left ventricular systolic function   BRIEF CLINICAL NOTE AND INDICATIONS FOR SURGERY  This 77 year old gentleman has stage D, severe, symptomatic aortic stenosis with NYHA class III symptoms of exertional fatigue and shortness of breath of several months duration consistent with chronic diastolic heart failure. He has recently started having shortness of breath and chest discomfort at rest prompting admission to the hospital. I have personally reviewed his 2D echocardiogram from November and cardiac catheterization from today. His echocardiogram showed a trileaflet aortic valve had severely thickened, calcified, and restricted leaflets. The mean transvalvular gradient was 54 mmHg with a peak gradient of 97 mmHg. The dimensionless index was 0.22 with a aortic valve area of 0.82 cm. Cardiac catheterization today showed severe native three-vessel coronary disease with 4 out of 5 patent bypass grafts and no significant ischemic territories. The mean transvalvular gradient was measured at 51 mmHg. I agree that aortic valve replacement is indicated in this patient. I think he would be at high risk for redo sternotomy and aortic valve replacement since he has a patent right internal mammary graft to the posterior descending  coronary artery and a patent left internal mammary graft to the LAD. In addition he is 77 years old and undergoing treatment for B-cell lymphoma and has pancytopenia. I think his risk would be considerably higher than that predicted by the STS risk calculator. Transcatheter aortic valve replacement would be a reasonable alternative for this patient with much less risk.  I have personally reviewed his gated cardiac CTA and his CTA of the chest, abdomen, and pelvis.  His gated cardiac CTA shows anatomy suitable for transcatheter aortic valve replacement using a Sapien 3 valve.  His abdominal and pelvic vascular anatomy appears suitable for transfemoral insertion.  The patient and his sister were counseled at length regarding treatment alternatives for management of severe symptomatic aortic stenosis. The risks and benefits of surgical intervention has been discussed in detail. Long-term prognosis with medical therapy was discussed. Alternative approaches such as conventional surgical aortic valve replacement, transcatheter aortic valve replacement, and palliative medical therapy were compared and contrasted at length. This discussion was placed in the context of the patient's own specific clinical presentation and past medical history. All of their questions have been addressed.  The patient has been advised of a variety of complications that might develop including but not limited to risks of death, stroke, paravalvular leak, aortic dissection or other major vascular complications, aortic annulus rupture, device embolization, cardiac rupture or perforation, mitral regurgitation, acute myocardial infarction, arrhythmia, heart block or bradycardia requiring permanent pacemaker placement, congestive heart failure, respiratory failure, renal failure, pneumonia, infection, other late complications related to structural valve deterioration or migration, or other complications that might ultimately cause a temporary or  permanent loss of functional independence or other long term morbidity. The patient provides full informed consent for the  procedure as described and all questions were answered.     DETAILS OF THE OPERATIVE PROCEDURE  PREPARATION:    The patient is brought to the operating room on the above mentioned date and central monitoring was established by the anesthesia team including placement of a central venous line and radial arterial line. The patient is placed in the supine position on the operating table.  Intravenous antibiotics are administered. The patient is monitored closely throughout the procedure under conscious sedation.    Baseline transthoracic echocardiogram was performed. The patient's chest, abdomen, both groins, and both lower extremities are prepared and draped in a sterile manner. A time out procedure is performed.   PERIPHERAL ACCESS:    Using the modified Seldinger technique, femoral arterial and venous access was obtained with placement of 6 Fr sheaths on the left side.  A pigtail diagnostic catheter was passed through the left arterial sheath under fluoroscopic guidance into the aortic root.  A temporary transvenous pacemaker catheter was passed through the left femoral venous sheath under fluoroscopic guidance into the right ventricle.  The pacemaker was tested to ensure stable lead placement and pacemaker capture. Aortic root angiography was performed in order to determine the optimal angiographic angle for valve deployment.   TRANSFEMORAL ACCESS:    The right common femoral artery was cannulated using a micropuncture needle and appropriate location was verified using hand injection angiogram.  A pair of Abbott Perclose percutaneous closure devices were placed and a 6 French sheath replaced into the femoral artery.  The patient was heparinized systemically and ACT verified > 250 seconds.    A 14 Fr transfemoral E-sheath was introduced into the right femoral artery after  progressively dilating over an Amplatz superstiff wire. An AL-2 catheter was used to direct a straight-tip exchange length wire across the native aortic valve into the left ventricle. This was exchanged out for a pigtail catheter and position was confirmed in the LV apex. Simultaneous LV and Ao pressures were recorded.  The pigtail catheter was exchanged for an Amplatz Extra-stiff wire in the LV apex.  Echocardiography was utilized to confirm appropriate wire position and no sign of entanglement in the mitral subvalvular apparatus.   BALLOON AORTIC VALVULOPLASTY:   Not performed  TRANSCATHETER HEART VALVE DEPLOYMENT:   An Edwards Sapien 3 transcatheter heart valve (size 26 mm, model #[id number], serial #[id number]) was prepared and crimped per manufacturer's guidelines, and the proper orientation of the valve is confirmed on the [employer] delivery system. The valve was advanced through the introducer sheath using normal technique until in an appropriate position in the abdominal aorta beyond the sheath tip. The balloon was then retracted and using the fine-tuning wheel was centered on the valve. The valve was then advanced across the aortic arch using appropriate flexion of the catheter. The valve was carefully positioned across the aortic valve annulus. The Commander catheter was retracted using normal technique. Once final position of the valve has been confirmed by angiographic assessment, the valve is deployed while temporarily holding ventilation and during rapid ventricular pacing to maintain systolic blood pressure < 50 mmHg and pulse pressure < 10 mmHg. The balloon inflation is held for >3 seconds after reaching full deployment volume. Once the balloon has fully deflated the balloon is retracted into the ascending aorta and valve function is assessed using echocardiography. There is felt to be no paravalvular leak and no central aortic insufficiency.  The patient's hemodynamic recovery following  valve deployment is good.  The deployment balloon  and guidewire are both removed.    PROCEDURE COMPLETION:   The sheath was removed and femoral artery closure performed using the Perclose devices.  Protamine was administered once femoral arterial repair was complete. The temporary pacemaker was left in place due to bradycardia at the beginning of the procedure. The pigtail catheters and femoral sheaths were removed with manual pressure used for hemostasis.   The patient tolerated the procedure well and is transported to the surgical intensive care in stable condition. There were no immediate intraoperative complications. All sponge instrument and needle counts are verified correct at completion of the operation.   No blood products were administered during the operation.  The patient received a total of 80.9 mL of intravenous contrast during the procedure.   Gaye Pollack, MD 09/09/2017 12:45 PM

## 2017-09-09 NOTE — Progress Notes (Signed)
  Echocardiogram 2D Echocardiogram has been performed.  Richard [name] 09/09/2017, 9:19 AM

## 2017-09-09 NOTE — OR Nursing (Signed)
6 french pulled from left femoral artery. Manual pressure held for twenty minutes. DP palpable pre and post sheath pull. Vital signs stable throughout sheath pull. No signs and symptoms of hematoma or bruising. Site dressed with 4x4 gauze and tegaderm. Attending Rn evaluated site post procedure. Attending Rn educated on post sheath removal.

## 2017-09-09 NOTE — CV Procedure (Signed)
[facility]  TAVR OPERATIVE NOTE   Date of Procedure:  09/09/2017  Preoperative Diagnosis: Severe Aortic Stenosis   Postoperative Diagnosis: Same   Procedure:    Transcatheter Aortic Valve Replacement - Transfemoral Approach  Edwards Sapien 3 THV (size 26 mm, model # [id number], serial # [id number])   Co-Surgeons:  Lauree Chandler, MD and Gaye Pollack, MD   Anesthesiologist:  Roanna Banning  Echocardiographer:  Meda Coffee  Pre-operative Echo Findings:  Severe aortic stenosis  Normal left ventricular systolic function  Post-operative Echo Findings:  No paravalvular leak  Normal left ventricular systolic function  BRIEF CLINICAL NOTE AND INDICATIONS FOR SURGERY  77 yo male with h/o CAD s/p CABG,  HLD, DM, obesity, OSA on CPAP, large B cell lymphoma and severe aortic stenosis here today for TAVR. CAD stable by cath. His counts have recovered from most recent round of chemotherapy.   During the course of the patient's preoperative work up they have been evaluated comprehensively by a multidisciplinary team of specialists coordinated through the [facility] in the [facility].  They have been demonstrated to suffer from symptomatic severe aortic stenosis as noted above. The patient has been counseled extensively as to the relative risks and benefits of all options for the treatment of severe aortic stenosis including long term medical therapy, conventional surgery for aortic valve replacement, and transcatheter aortic valve replacement.  The patient has been independently evaluated by two cardiac surgeons including Dr Roxy Manns and Dr. Cyndia Bent, and they are felt to be at high risk for conventional surgical aortic valve replacement. Both surgeons indicated the patient would be a poor candidate for conventional surgery. Based upon review of all of the patient's preoperative diagnostic tests they are felt to be candidate for  transcatheter aortic valve replacement using the transfemoral approach as an alternative to high risk conventional surgery.    Following the decision to proceed with transcatheter aortic valve replacement, a discussion has been held regarding what types of management strategies would be attempted intraoperatively in the event of life-threatening complications, including whether or not the patient would be considered a candidate for the use of cardiopulmonary bypass and/or conversion to open sternotomy for attempted surgical intervention.  The patient has been advised of a variety of complications that might develop peculiar to this approach including but not limited to risks of death, stroke, paravalvular leak, aortic dissection or other major vascular complications, aortic annulus rupture, device embolization, cardiac rupture or perforation, acute myocardial infarction, arrhythmia, heart block or bradycardia requiring permanent pacemaker placement, congestive heart failure, respiratory failure, renal failure, pneumonia, infection, other late complications related to structural valve deterioration or migration, or other complications that might ultimately cause a temporary or permanent loss of functional independence or other long term morbidity.  The patient provides full informed consent for the procedure as described and all questions were answered preoperatively.    DETAILS OF THE OPERATIVE PROCEDURE  PREPARATION:   The patient is brought to the operating room on the above mentioned date and central monitoring was established by the anesthesia team including placement of a radial arterial line. The patient is placed in the supine position on the operating table.  Intravenous antibiotics are administered. Conscious sedation is used.   Baseline transthoracic echocardiogram was performed. The patient's chest, abdomen, both groins, and both lower extremities are prepared and draped in a sterile manner. A  time out procedure is performed.   PERIPHERAL ACCESS:   Using the modified  Seldinger technique, femoral arterial and venous access were obtained with placement of 6 Fr sheaths on the left side using u/s guidance.  A pigtail diagnostic catheter was passed through the femoral arterial sheath under fluoroscopic guidance into the aortic root.  A temporary transvenous pacemaker catheter was passed through the femoral venous sheath under fluoroscopic guidance into the right ventricle.  The pacemaker was tested to ensure stable lead placement and pacemaker capture. Aortic root angiography was performed in order to determine the optimal angiographic angle for valve deployment.  TRANSFEMORAL ACCESS:  A micropuncture kit was used to gain access to the right femoral artery using u/s guidance. Position confirmed with angiography. Pre-closure with double ProGlide closure devices. The patient was heparinized systemically and ACT verified > 250 seconds.    A 14 Fr transfemoral E-sheath was introduced into the right femoral artery after progressively dilating over an Amplatz superstiff wire. An AL-2 catheter was used to direct a straight-tip exchange length wire across the native aortic valve into the left ventricle. This was exchanged out for a pigtail catheter and position was confirmed in the LV apex. Simultaneous LV and Ao pressures were recorded.  The pigtail catheter was then exchanged for an Amplatz Extra-stiff wire in the LV apex.   TRANSCATHETER HEART VALVE DEPLOYMENT:  An Edwards Sapien 3 THV (size 26 mm) was prepared and crimped per manufacturer's guidelines, and the proper orientation of the valve is confirmed on the [employer] delivery system. The valve was advanced through the introducer sheath using normal technique until in an appropriate position in the abdominal aorta beyond the sheath tip. The balloon was then retracted and using the fine-tuning wheel was centered on the valve. The valve was  then advanced across the aortic arch using appropriate flexion of the catheter. The valve was carefully positioned across the aortic valve annulus. The Commander catheter was retracted using normal technique. Once final position of the valve has been confirmed by angiographic assessment, the valve is deployed while temporarily holding ventilation and during rapid ventricular pacing to maintain systolic blood pressure < 50 mmHg and pulse pressure < 10 mmHg. The balloon inflation is held for >3 seconds after reaching full deployment volume. Once the balloon has fully deflated the balloon is retracted into the ascending aorta and valve function is assessed using transthoracic echo. There is felt to be no paravalvular leak and no central aortic insufficiency.  The patient's hemodynamic recovery following valve deployment is good.  The deployment balloon and guidewire are both removed. Echo demostrated acceptable post-procedural gradients, stable mitral valve function, and no AI.   PROCEDURE COMPLETION:  The sheath was then removed and closure devices were completed. Protamine was administered once femoral arterial repair was complete. The pigtail catheter and arterial femoral sheath was removed with manual pressure used for hemostasis. The temporary pacemaker was left in place.   The patient tolerated the procedure well and is transported to the surgical intensive care in stable condition. There were no immediate intraoperative complications. All sponge instrument and needle counts are verified correct at completion of the operation.   No blood products were administered during the operation.  The patient received a total of 80.9 mL of intravenous contrast during the procedure.  Lauree Chandler MD 09/09/2017 9:38 AM

## 2017-09-09 NOTE — Anesthesia Procedure Notes (Addendum)
Central Venous Catheter Insertion Performed by: Murvin Natal, MD, anesthesiologist Start/End3/05/2018 6:40 AM, 09/09/2017 7:00 AM Patient location: Pre-op. Preanesthetic checklist: patient identified, IV checked, site marked, risks and benefits discussed, surgical consent, monitors and equipment checked, pre-op evaluation, timeout performed and anesthesia consent Position: Trendelenburg Lidocaine 1% used for infiltration and patient sedated Hand hygiene performed  and maximum sterile barriers used  Catheter size: 8 Fr Total catheter length 16. Central line was placed.Double lumen Procedure performed using ultrasound guided technique. Ultrasound Notes:image(s) printed for medical record Attempts: 1 Following insertion, dressing applied, line sutured and Biopatch. Post procedure assessment: blood return through all ports, free fluid flow and no air  Patient tolerated the procedure well with no immediate complications.

## 2017-09-09 NOTE — Transfer of Care (Signed)
Immediate Anesthesia Transfer of Care Note  Patient: Richard [name]  Procedure(s) Performed: TRANSCATHETER AORTIC VALVE REPLACEMENT, TRANSFEMORAL (N/A Chest) INTRAOPERATIVE TRANSTHORACIC ECHOCARDIOGRAM (N/A Chest)  Patient Location: SICU  Anesthesia Type:MAC  Level of Consciousness: awake and alert   Airway & Oxygen Therapy: Patient Spontanous Breathing and Patient connected to face mask oxygen  Post-op Assessment: Report given to RN, Post -op Vital signs reviewed and stable and Patient moving all extremities X 4  Post vital signs: Reviewed and stable  Last Vitals:  Vitals:   09/09/17 0551  BP: (!) 132/55  Pulse: 70  Resp: 18  Temp: 37.1 C  SpO2: 93%    Last Pain:  Vitals:   09/09/17 0551  TempSrc: Oral      Patients Stated Pain Goal: 3 (00/34/96 1164)  Complications: No apparent anesthesia complications

## 2017-09-09 NOTE — Anesthesia Procedure Notes (Signed)
Arterial Line Insertion Start/End3/05/2018 6:30 AM, 09/09/2017 6:32 AM Performed by: Josephine Igo, CRNA, CRNA  Patient location: Pre-op. Preanesthetic checklist: patient identified, IV checked, site marked, risks and benefits discussed, surgical consent, monitors and equipment checked, pre-op evaluation, timeout performed and anesthesia consent Lidocaine 1% used for infiltration radial was placed Catheter size: 20 G Hand hygiene performed  and maximum sterile barriers used  Allen's test indicative of satisfactory collateral circulation Attempts: 1 Procedure performed without using ultrasound guided technique. Ultrasound Notes:anatomy identified Following insertion, dressing applied and Biopatch. Post procedure assessment: normal  Patient tolerated the procedure well with no immediate complications.

## 2017-09-09 NOTE — Anesthesia Postprocedure Evaluation (Signed)
Anesthesia Post Note  Patient: Richard [name]  Procedure(s) Performed: TRANSCATHETER AORTIC VALVE REPLACEMENT, TRANSFEMORAL (N/A Chest) INTRAOPERATIVE TRANSTHORACIC ECHOCARDIOGRAM (N/A Chest)     Patient location during evaluation: SICU Anesthesia Type: MAC Level of consciousness: awake Pain management: pain level controlled Vital Signs Assessment: post-procedure vital signs reviewed and stable Respiratory status: spontaneous breathing, nonlabored ventilation, respiratory function stable and patient connected to nasal cannula oxygen Cardiovascular status: stable and blood pressure returned to baseline Postop Assessment: no apparent nausea or vomiting Anesthetic complications: no    Last Vitals:  Vitals:   09/09/17 1145 09/09/17 1300  BP:  (!) 104/54  Pulse: (!) 49   Resp: 16   Temp:  36.8 C  SpO2: 100%     Last Pain:  Vitals:   09/09/17 1300  TempSrc: Oral  PainSc:                  Ryan P Ellender

## 2017-09-09 NOTE — Progress Notes (Addendum)
On arriving to [facility], discussed patient history of asymptomatic bradycardia with MD Mcalhany. It was decided it would be  appropriate to set pacer to rate of 50 bpm.

## 2017-09-09 NOTE — Interval H&P Note (Signed)
History and Physical Interval Note:  09/09/2017 5:40 AM  Richard [name]  has presented today for surgery, with the diagnosis of Severe Aortic Stenosis  The various methods of treatment have been discussed with the patient and family. After consideration of risks, benefits and other options for treatment, the patient has consented to  Procedure(s): TRANSCATHETER AORTIC VALVE REPLACEMENT, TRANSFEMORAL (N/A) TRANSESOPHAGEAL ECHOCARDIOGRAM (TEE) (N/A) as a surgical intervention .  The patient's history has been reviewed, patient examined, no change in status, stable for surgery.  I have reviewed the patient's chart and labs.  Questions were answered to the patient's satisfaction.     Gaye Pollack

## 2017-09-10 ENCOUNTER — Inpatient Hospital Stay (HOSPITAL_COMMUNITY): Payer: Medicare Other

## 2017-09-10 ENCOUNTER — Other Ambulatory Visit: Payer: Self-pay

## 2017-09-10 ENCOUNTER — Inpatient Hospital Stay (HOSPITAL_COMMUNITY)
Admission: RE | Disposition: A | Discharge status: Home / Self Care | Payer: Self-pay | Source: Ambulatory Visit | Attending: Cardiovascular Disease

## 2017-09-10 ENCOUNTER — Encounter (HOSPITAL_COMMUNITY): Payer: Self-pay | Admitting: Cardiovascular Disease

## 2017-09-10 DIAGNOSIS — C8330 Diffuse large B-cell lymphoma, unspecified site: Secondary | ICD-10-CM

## 2017-09-10 DIAGNOSIS — I5043 Acute on chronic combined systolic (congestive) and diastolic (congestive) heart failure: Secondary | ICD-10-CM

## 2017-09-10 DIAGNOSIS — I442 Atrioventricular block, complete: Secondary | ICD-10-CM

## 2017-09-10 DIAGNOSIS — Z953 Presence of xenogenic heart valve: Secondary | ICD-10-CM

## 2017-09-10 DIAGNOSIS — I361 Nonrheumatic tricuspid (valve) insufficiency: Secondary | ICD-10-CM

## 2017-09-10 DIAGNOSIS — I459 Conduction disorder, unspecified: Secondary | ICD-10-CM

## 2017-09-10 DIAGNOSIS — I350 Nonrheumatic aortic (valve) stenosis: Principal | ICD-10-CM

## 2017-09-10 HISTORY — PX: PACEMAKER IMPLANT: EP1218

## 2017-09-10 LAB — GLUCOSE, CAPILLARY
GLUCOSE-CAPILLARY: 111 mg/dL — AB (ref 65–99)
GLUCOSE-CAPILLARY: 112 mg/dL — AB (ref 65–99)
Glucose-Capillary: 103 mg/dL — ABNORMAL HIGH (ref 65–99)
Glucose-Capillary: 124 mg/dL — ABNORMAL HIGH (ref 65–99)

## 2017-09-10 LAB — CBC
HEMATOCRIT: 28.3 % — AB (ref 39.0–52.0)
HEMOGLOBIN: 8.6 g/dL — AB (ref 13.0–17.0)
MCH: 28.2 pg (ref 26.0–34.0)
MCHC: 30.4 g/dL (ref 30.0–36.0)
MCV: 92.8 fL (ref 78.0–100.0)
Platelets: 82 10*3/uL — ABNORMAL LOW (ref 150–400)
RBC: 3.05 MIL/uL — AB (ref 4.22–5.81)
RDW: 18.6 % — AB (ref 11.5–15.5)
WBC: 2.2 10*3/uL — AB (ref 4.0–10.5)

## 2017-09-10 LAB — BASIC METABOLIC PANEL
ANION GAP: 9 (ref 5–15)
BUN: 12 mg/dL (ref 6–20)
CHLORIDE: 104 mmol/L (ref 101–111)
CO2: 23 mmol/L (ref 22–32)
Calcium: 8 mg/dL — ABNORMAL LOW (ref 8.9–10.3)
Creatinine, Ser: 0.91 mg/dL (ref 0.61–1.24)
GFR calc non Af Amer: >60 mL/min (ref >60)
GLUCOSE: 122 mg/dL — AB (ref 65–99)
Potassium: 3.9 mmol/L (ref 3.5–5.1)
Sodium: 136 mmol/L (ref 135–145)

## 2017-09-10 LAB — ECHOCARDIOGRAM COMPLETE
Height: 72 in
WEIGHTICAEL: 3992.97 [oz_av]

## 2017-09-10 LAB — FERRITIN: FERRITIN: 834 ng/mL — AB (ref 24–336)

## 2017-09-10 LAB — IRON AND TIBC
IRON: 64 ug/dL (ref 45–182)
SATURATION RATIOS: 23 % (ref 17.9–39.5)
TIBC: 284 ug/dL (ref 250–450)
UIBC: 220 ug/dL

## 2017-09-10 LAB — MAGNESIUM: Magnesium: 1.8 mg/dL (ref 1.7–2.4)

## 2017-09-10 LAB — POCT ACTIVATED CLOTTING TIME: ACTIVATED CLOTTING TIME: 120 s

## 2017-09-10 SURGERY — PACEMAKER IMPLANT

## 2017-09-10 MED ORDER — ACETAMINOPHEN 325 MG PO TABS: 325.0000 mg | ORAL_TABLET | ORAL | Status: DC | PRN

## 2017-09-10 MED ORDER — CEFAZOLIN SODIUM-DEXTROSE 1-4 GM/50ML-% IV SOLN
1.0000 g | Freq: Four times a day (QID) | INTRAVENOUS | Status: AC
  Administered 2017-09-10 – 2017-09-11 (×3): 1 g via INTRAVENOUS
  Filled 2017-09-10 (×3): qty 50

## 2017-09-10 MED ORDER — SODIUM CHLORIDE 0.9 % IR SOLN
Status: AC
  Filled 2017-09-10: qty 2

## 2017-09-10 MED ORDER — LIDOCAINE HCL 1 % IJ SOLN
INTRAMUSCULAR | Status: AC
  Filled 2017-09-10: qty 20

## 2017-09-10 MED ORDER — SODIUM CHLORIDE 0.9 % IR SOLN
80.0000 mg | Status: AC
  Administered 2017-09-10: 80 mg

## 2017-09-10 MED ORDER — LIDOCAINE HCL (PF) 1 % IJ SOLN
INTRAMUSCULAR | Status: DC | PRN
  Administered 2017-09-10: 60 mL

## 2017-09-10 MED ORDER — FENTANYL CITRATE (PF) 100 MCG/2ML IJ SOLN
INTRAMUSCULAR | Status: AC
  Filled 2017-09-10: qty 2

## 2017-09-10 MED ORDER — MIDAZOLAM HCL 5 MG/5ML IJ SOLN
INTRAMUSCULAR | Status: DC | PRN
  Administered 2017-09-10 (×2): 1 mg via INTRAVENOUS

## 2017-09-10 MED ORDER — MUPIROCIN 2 % EX OINT
1.0000 "application " | TOPICAL_OINTMENT | Freq: Two times a day (BID) | CUTANEOUS | Status: DC
  Administered 2017-09-10: 1 via NASAL
  Filled 2017-09-10: qty 22

## 2017-09-10 MED ORDER — CHLORHEXIDINE GLUCONATE CLOTH 2 % EX PADS
6.0000 | MEDICATED_PAD | Freq: Every day | CUTANEOUS | Status: DC
  Administered 2017-09-10 – 2017-09-12 (×3): 6 via TOPICAL

## 2017-09-10 MED ORDER — CHLORHEXIDINE GLUCONATE CLOTH 2 % EX PADS: 6.0000 | MEDICATED_PAD | Freq: Every day | CUTANEOUS | Status: DC

## 2017-09-10 MED ORDER — FENTANYL CITRATE (PF) 100 MCG/2ML IJ SOLN
INTRAMUSCULAR | Status: DC | PRN
  Administered 2017-09-10 (×2): 25 ug via INTRAVENOUS

## 2017-09-10 MED ORDER — MIDAZOLAM HCL 5 MG/5ML IJ SOLN
INTRAMUSCULAR | Status: AC
  Filled 2017-09-10: qty 5

## 2017-09-10 MED ORDER — HEPARIN (PORCINE) IN NACL 2-0.9 UNIT/ML-% IJ SOLN
INTRAMUSCULAR | Status: AC
  Filled 2017-09-10: qty 500

## 2017-09-10 MED ORDER — SODIUM CHLORIDE 0.9 % IV SOLN
INTRAVENOUS | Status: AC
  Administered 2017-09-10: 15:00:00 via INTRAVENOUS

## 2017-09-10 MED ORDER — MUPIROCIN 2 % EX OINT
1.0000 "application " | TOPICAL_OINTMENT | Freq: Two times a day (BID) | CUTANEOUS | Status: AC
  Administered 2017-09-10 – 2017-09-11 (×3): 1 via NASAL
  Filled 2017-09-10: qty 22

## 2017-09-10 MED ORDER — ATROPINE SULFATE 1 MG/10ML IJ SOSY
PREFILLED_SYRINGE | INTRAMUSCULAR | Status: AC
  Filled 2017-09-10: qty 10

## 2017-09-10 MED ORDER — ONDANSETRON HCL 4 MG/2ML IJ SOLN: 4.0000 mg | Freq: Four times a day (QID) | INTRAMUSCULAR | Status: DC | PRN

## 2017-09-10 MED ORDER — FUROSEMIDE 10 MG/ML IJ SOLN
80.0000 mg | Freq: Two times a day (BID) | INTRAMUSCULAR | Status: DC
  Administered 2017-09-10 (×2): 80 mg via INTRAVENOUS
  Filled 2017-09-10 (×2): qty 8

## 2017-09-10 MED ORDER — CEFAZOLIN SODIUM-DEXTROSE 2-4 GM/100ML-% IV SOLN: 2.0000 g | INTRAVENOUS | Status: DC

## 2017-09-10 MED ORDER — HEPARIN (PORCINE) IN NACL 2-0.9 UNIT/ML-% IJ SOLN
INTRAMUSCULAR | Status: AC | PRN
  Administered 2017-09-10: 500 mL

## 2017-09-10 MED ORDER — CEFAZOLIN SODIUM-DEXTROSE 2-4 GM/100ML-% IV SOLN
INTRAVENOUS | Status: AC
  Filled 2017-09-10: qty 100

## 2017-09-10 MED ORDER — SODIUM CHLORIDE 0.9 % IV SOLN
INTRAVENOUS | Status: DC
  Administered 2017-09-10: 12:00:00 via INTRAVENOUS

## 2017-09-10 SURGICAL SUPPLY — 13 items
CABLE SURGICAL S-101-97-12 (CABLE) ×4 IMPLANT
CATH RIGHTSITE C315HIS02 (CATHETERS) ×2 IMPLANT
HEMOSTAT SURGICEL 2X4 FIBR (HEMOSTASIS) ×2 IMPLANT
IPG PACE AZUR XT DR MRI W1DR01 (Pacemaker) IMPLANT
LEAD CAPSURE NOVUS 5076-58CM (Lead) ×2 IMPLANT
LEAD SELECT SECURE 3830 383069 (Lead) IMPLANT
PACE AZURE XT DR MRI W1DR01 (Pacemaker) ×3 IMPLANT
PAD DEFIB LIFELINK (PAD) ×2 IMPLANT
SELECT SECURE 3830 383069 (Lead) ×3 IMPLANT
SHEATH CLASSIC 7F (SHEATH) ×4 IMPLANT
SLITTER 6232ADJ (MISCELLANEOUS) ×2 IMPLANT
TRAY PACEMAKER INSERTION (PACKS) ×2 IMPLANT
WIRE HI TORQ VERSACORE-J 145CM (WIRE) ×2 IMPLANT

## 2017-09-10 NOTE — Consult Note (Addendum)
ELECTROPHYSIOLOGY CONSULT NOTE    Patient ID: Richard [name] MRN: [id number], DOB/AGE: May 04, 1941 77 y.o.  Admit date: 09/09/2017 Date of Consult: 09/10/2017  Primary Physician: Carollee Herter, Alferd Apa, DO Primary Cardiologist: Percival Spanish Structural Heart: Angelena Form   Electrophysiologist: Caryl Comes (new this admission)  Patient Profile: Richard [name] is a 77 y.o. male with a history of chronic diastolic heart failure, large B cell lymphoma, severe AS s/p TAVR this admission, and bradycardia who is being seen today for the evaluation of heart block s/p TAVR at the request of Dr Angelena Form.  HPI:  Richard [name] is a 77 y.o. male with the above past medical history.  He has completed therapy for large B cell lymphoma and has become more symptomatic with his AS.  He underwent TAVR yesterday with subsequent heart block that has persisted this morning. He is on no AVN blocking agents.  EP has been asked to evaluate for treatment options.   He feels significantly improved post TAVR with improved energy and appetite. He is currently having back pain 2/2 laying in the bed.   He denies chest pain, palpitations, dyspnea, PND, orthopnea, nausea, vomiting, dizziness, syncope, edema, weight gain, or early satiety.  Past Medical History:  Diagnosis Date  . Anemia   . Axillary adenopathy 02/25/2017  . Bradycardia    a. holter monitor has demonstrated HRs in 30s and [place]   . CAD (coronary artery disease)    a. s/p CABG 2001  . Chronic lower back pain   . Diffuse large B cell lymphoma ([place])   . Diverticulosis   . Esophageal stricture   . GERD (gastroesophageal reflux disease)   . History of gout   . HTN (hypertension)   . Mixed hyperlipidemia   . OSA on CPAP    with 2L O2 at night  . Pancytopenia ([place])    a. related to chemo therapy for B cell lymphoma  . Peptic stricture of esophagus   . Severe aortic stenosis   . Spinal stenosis   . Type II diabetes mellitus ([place])      Surgical  History:  Past Surgical History:  Procedure Laterality Date  . APPENDECTOMY  ~ 1952  . BACK SURGERY    . CATARACT EXTRACTION W/PHACO Left 11/06/2016   Procedure: CATARACT EXTRACTION PHACO AND INTRAOCULAR LENS PLACEMENT (IOC);  Surgeon: Estill Cotta, MD;  Location: [facility] ORS;  Service: Ophthalmology;  Laterality: Left;  Lot # [id number] H [place]: 01:09.4 AP%:25.2 CDE: 30.64  . CATARACT EXTRACTION W/PHACO Right 12/04/2016   Procedure: CATARACT EXTRACTION PHACO AND INTRAOCULAR LENS PLACEMENT (IOC);  Surgeon: Estill Cotta, MD;  Location: [facility] ORS;  Service: Ophthalmology;  Laterality: Right;  [place] 1:25.9 AP% 24.1 CDE 39.10 Fluid Pack lot # [id number]  . COLONOSCOPY W/ BIOPSIES AND POLYPECTOMY  2013  . CORONARY ANGIOPLASTY  1993  . CORONARY ANGIOPLASTY WITH STENT PLACEMENT  05/1997   "1"  . CORONARY ARTERY BYPASS GRAFT  03/2000   "CABG X5"  . ESOPHAGEAL DILATION  X 3-4   Dr. Lyla Son; "last one was in the 1990's"  . ESOPHAGOGASTRODUODENOSCOPY     multiple  . FLEXIBLE SIGMOIDOSCOPY     multiple  . HYDRADENITIS EXCISION Left 02/25/2017   Procedure: EXCISION DEEP LEFT AXILLARY LYMPH NODE;  Surgeon: Fanny Skates, MD;  Location: [facility];  Service: General;  Laterality: Left;  . KNEE ARTHROSCOPY Left 2011   meniscus repair  . LEFT HEART CATHETERIZATION WITH CORONARY/GRAFT ANGIOGRAM N/A 03/16/2014   Procedure: LEFT HEART CATHETERIZATION  WITH Beatrix Fetters;  Surgeon: Burnell Blanks, MD;  Location: [facility] CATH LAB;  Service: Cardiovascular;  Laterality: N/A;  . LUMBAR LAMINECTOMY/DECOMPRESSION MICRODISCECTOMY Right 06/17/2013   Procedure: LUMBAR LAMINECTOMY MICRODISCECTOMY L4-L5 RIGHT EXCISION OF SYNOVIAL CYST RIGHT   (1 LEVEL) RIGHT PARTIAL FACETECTOMY;  Surgeon: Tobi Bastos, MD;  Location: [facility];  Service: Orthopedics;  Laterality: Right;  . MYELOGRAM  04/06/13   lumbar, Dr Gladstone Lighter  . PANENDOSCOPY    . PORTACATH PLACEMENT N/A 03/06/2017   Procedure: INSERTION PORT-A-CATH AND  ASPIRATE SEROMA LEFT AXILLA;  Surgeon: Fanny Skates, MD;  Location: [facility];  Service: General;  Laterality: N/A;  . RIGHT/LEFT HEART CATH AND CORONARY/GRAFT ANGIOGRAPHY N/A 07/28/2017   Procedure: RIGHT/LEFT HEART CATH AND CORONARY/GRAFT ANGIOGRAPHY;  Surgeon: Sherren Mocha, MD;  Location: [facility] CV LAB;  Service: Cardiovascular;  Laterality: N/A;  . SHOULDER SURGERY Right 08/2010   screws placed; "tendons tore off"  . SKIN CANCER EXCISION Right    "neck"  . TONSILLECTOMY  ~ 1954  . UPPER GI ENDOSCOPY  2013   Gastritis; Dr Carlean Purl  . VASECTOMY       Medications Prior to Admission  Medication Sig Dispense Refill Last Dose  . allopurinol (ZYLOPRIM) 300 MG tablet Take 450 mg by mouth daily.    09/08/2017 at Unknown time  . amLODipine (NORVASC) 5 MG tablet TAKE 2 TABLETS (10 MG TOTAL) BY MOUTH DAILY. (Patient taking differently: Take 1 tablet BID) 60 tablet 11 09/08/2017 at Unknown time  . aspirin 81 MG tablet Take 81 mg by mouth daily.   Past Week at Unknown time  . atorvastatin (LIPITOR) 80 MG tablet TAKE ONE TABLET BY MOUTH AT BEDTIME 90 tablet 3 09/08/2017 at Unknown time  . esomeprazole (NEXIUM) 40 MG capsule Take 1 capsule (40 mg total) by mouth daily. 30 capsule 5 09/08/2017 at Unknown time  . ezetimibe (ZETIA) 10 MG tablet TAKE ONE TABLET BY MOUTH DAILY 90 tablet 3 09/08/2017 at Unknown time  . famciclovir (FAMVIR) 500 MG tablet Take 1 tablet (500 mg total) by mouth daily. 30 tablet 12 09/08/2017 at Unknown time  . fluticasone (FLONASE) 50 MCG/ACT nasal spray INHALE ONE SPRAY INTO EACH NOSTRIL TWICE DAILY. 16 g 6 Past Week at Unknown time  . furosemide (LASIX) 40 MG tablet Take 120 mg by mouth 2 (two) times daily.   09/08/2017 at Unknown time  . glimepiride (AMARYL) 2 MG tablet TAKE 1 TABLET (2 MG TOTAL) BY MOUTH DAILY WITH BREAKFAST. 90 tablet 0 Past Week at Unknown time  . lipase/protease/amylase (CREON) 36000 UNITS CPEP capsule Take 2 capsules (72,000 Units total) by mouth 3 (three)  times daily with meals. 1 capsule with snack. 210 capsule 2 09/08/2017 at Unknown time  . METFORMIN HCL PO Take 40 mg by mouth 2 (two) times daily with a meal. Pt takes 1.5 tablets in the AM and 1 tablet in the PM   09/08/2017 at Unknown time  . ondansetron (ZOFRAN) 8 MG tablet Take 1 tablet (8 mg total) by mouth 2 (two) times daily as needed for refractory nausea / vomiting. Start on day 3 after cyclophosphamide chemotherapy. 30 tablet 1 Past Month at Unknown time  . polycarbophil (FIBERCON) 625 MG tablet Take 625 mg by mouth daily.   09/08/2017 at Unknown time  . potassium chloride SA (K-DUR,KLOR-CON) 20 MEQ tablet Take 40 mEq by mouth 2 (two) times daily.   09/08/2017 at Unknown time  . pregabalin (LYRICA) 75 MG capsule Take 1 capsule (75  mg total) by mouth 3 (three) times daily. 270 capsule 3 09/08/2017 at Unknown time  . psyllium (METAMUCIL) 58.6 % powder Take 1 packet by mouth 3 (three) times daily.   09/08/2017 at Unknown time  . glucose blood (FREESTYLE LITE) test strip TEST BLOOD SUGAR bid 100 each 11 Taking  . Lancets (FREESTYLE) lancets Use BID to check blood sugar.  DX E11.9 100 each 6 Taking  . lidocaine-prilocaine (EMLA) cream Apply to affected area once (Patient taking differently: Apply 1 application topically as needed (to port). ) 30 g 3 Taking  . LORazepam (ATIVAN) 0.5 MG tablet Take 0.5 mg by mouth every 6 (six) hours as needed for anxiety.   Taking  . NEOMYCIN-POLYMYXIN-HYDROCORTISONE (CORTISPORIN) 1 % SOLN OTIC solution Apply 1-2 drops to toe BID after soaking 10 mL 1 Taking  . nitroGLYCERIN (NITROSTAT) 0.4 MG SL tablet Place 1 tablet (0.4 mg total) under the tongue every 5 (five) minutes as needed for chest pain. 25 tablet 3 Taking  . polyethylene glycol (MIRALAX / GLYCOLAX) packet Take 17 g by mouth daily. 14 each 0 Taking  . prochlorperazine (COMPAZINE) 10 MG tablet Take 1 tablet (10 mg total) by mouth every 6 (six) hours as needed (Nausea or vomiting). (Patient not taking: Reported  on 09/05/2017) 30 tablet 6 Not Taking  . senna-docusate (SENOKOT-S) 8.6-50 MG tablet Take 1 tablet by mouth 2 (two) times daily. 30 tablet 0 Taking    Inpatient Medications:  . acetaminophen  1,000 mg Oral Q6H   Or  . acetaminophen (TYLENOL) oral liquid 160 mg/5 mL  1,000 mg Per Tube Q6H  . allopurinol  450 mg Oral Daily  . amLODipine  5 mg Oral BID  . aspirin EC  81 mg Oral Daily  . atorvastatin  80 mg Oral QHS  . Chlorhexidine Gluconate Cloth  6 each Topical Daily  . clopidogrel  75 mg Oral Q breakfast  . ezetimibe  10 mg Oral Daily  . fluticasone  1 spray Each Nare BID  . furosemide  80 mg Intravenous BID  . glimepiride  2 mg Oral Q breakfast  . guaiFENesin  600 mg Oral BID  . lipase/protease/amylase  72,000 Units Oral TID WC  . mouth rinse  15 mL Mouth Rinse BID  . pantoprazole  40 mg Oral Daily  . potassium chloride SA  40 mEq Oral BID  . pregabalin  75 mg Oral TID  . sodium chloride flush  10-40 mL Intracatheter Q12H  . valACYclovir  1,000 mg Oral Daily    Allergies:  Allergies  Allergen Reactions  . Benazepril Swelling    angioedema; he is not a candidate for any angiotensin receptor blockers because of this significant allergic reaction. Because of a history of documented adverse serious drug reaction;Medi Alert bracelet  is recommended  . Hctz [Hydrochlorothiazide] Anaphylaxis and Swelling    Tongue and lip swelling   . Aspirin Other (See Comments)    Gastritis, cant take 325 Mg aspirin   . Lactose Intolerance (Gi) Nausea And Vomiting    Social History   Socioeconomic History  . Marital status: Divorced    Spouse name: Not on file  . Number of children: 2  . Years of education: Not on file  . Highest education level: Not on file  Social Needs  . Financial resource strain: Not on file  . Food insecurity - worry: Not on file  . Food insecurity - inability: Not on file  . Transportation needs - medical: Not  on file  . Transportation needs - non-medical: Not  on file  Occupational History    Employer: RETIRED  Tobacco Use  . Smoking status: Never Smoker  . Smokeless tobacco: Never Used  Substance and Sexual Activity  . Alcohol use: No    Alcohol/week: 0.0 oz    Comment: "last drink was in 2012"( 03/15/2014)  . Drug use: No  . Sexual activity: Not Currently  Other Topics Concern  . Not on file  Social History Narrative   Divorced, lives with a roommate.   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     Family History  Problem Relation Age of Onset  . Stroke Father   . Hypertension Father   . Pancreatic cancer Mother   . Diabetes Maternal Grandmother   . Stroke Maternal Grandmother   . Heart attack Paternal Grandmother   . Colon cancer Neg Hx   . Esophageal cancer Neg Hx   . Rectal cancer Neg Hx   . Stomach cancer Neg Hx   . Ulcers Neg Hx      Review of Systems: All other systems reviewed and are otherwise negative except as noted above.  Physical Exam: Vitals:   09/10/17 0600 09/10/17 0700 09/10/17 0800 09/10/17 0823  BP: (!) 112/54 (!) 116/50 (!) 123/56   Pulse: (!) 59 63 (!) 58   Resp: 17 17 19    Temp:    98.1 F (36.7 C)  TempSrc:    Oral  SpO2: (!) 88% 94% 90%   Weight:      Height:        GEN- The patient is elderly and obese appearing, alert and oriented x 3 today.   HEENT: normocephalic, atraumatic; sclera clear, conjunctiva pink; hearing intact; oropharynx clear; neck supple Lungs- Clear to ausculation bilaterally, normal work of breathing.  No wheezes, rales, rhonchi Heart- Regular rate and rhythm  GI- soft, non-tender, non-distended, bowel sounds present Extremities- no clubbing, cyanosis, or edema  MS- no significant deformity or atrophy Skin- warm and dry, no rash or lesion, right chest with port in place  Psych- euthymic mood, full  affect Neuro- strength and sensation are intact  Labs:   Lab Results  Component Value Date   WBC 2.2 (L) 09/10/2017   HGB 8.6 (L) 09/10/2017   HCT 28.3 (L) 09/10/2017   MCV 92.8 09/10/2017   PLT 82 (L) 09/10/2017    Recent Labs  Lab 09/05/17 1110  09/10/17 0340  NA 142   < > 136  K 3.6   < > 3.9  CL 103   < > 104  CO2 31  --  23  BUN 17   < > 12  CREATININE 1.00   < > 0.91  CALCIUM 9.5  --  8.0*  PROT 6.4  --   --   BILITOT 1.0  --   --   ALKPHOS 106*  --   --   ALT 19  --   --   AST 24  --   --   GLUCOSE 103   < > 122*   < > = values in this interval not displayed.      Radiology/Studies: Dg Chest 2 View  Result Date: 09/05/2017 CLINICAL DATA:  Severe aortic stenosis. EXAM: CHEST - 2 VIEW COMPARISON:  Chest x-ray  dated August 19, 2017. FINDINGS: Unchanged right chest wall port catheter with the tip at the cavoatrial junction. Stable mild cardiomegaly status post CABG. Normal pulmonary vascularity. Trace bilateral pleural effusions. No consolidation or pneumothorax. No acute osseous abnormality. IMPRESSION: Trace bilateral pleural effusions. Electronically Signed   By: Titus Dubin M.D.   On: 09/05/2017 15:27   Nm Pet Image Restag (ps) Skull Base To Thigh  Result Date: 09/01/2017 CLINICAL DATA:  Subsequent treatment strategy for diffuse large B-cell lymphoma. Status post 4 cycles first-line therapy. EXAM: NUCLEAR MEDICINE PET SKULL BASE TO THIGH TECHNIQUE: 12.6 mCi F-18 FDG was injected intravenously. Full-ring PET imaging was performed from the skull base to thigh after the radiotracer. CT data was obtained and used for attenuation correction and anatomic localization. Fasting blood glucose: 109 mg/dl Mediastinal blood pool activity: SUV max 2.3 COMPARISON:  3 PET-CT 04/15/2017, 817 18 FINDINGS: NECK: No hypermetabolic lymph nodes in the neck. Incidental CT findings: none CHEST: No hypermetabolic lymph nodes in the chest. No hypermetabolic axillary lymph nodes. Seroma in  the LEFT chest wall adjacent to the axilla at lymphadenectomy site. Incidental CT findings: Bilateral pleural effusions are small to moderate and greater on the RIGHT. No suspicious pulmonary nodularity. Port in the RIGHT chest wall with tip in distal SVC ABDOMEN/PELVIS: Normal size spleen with normal metabolic activity. No hypermetabolic abdominopelvic lymph nodes. Incidental CT findings: Atherosclerotic calcification of the aorta. Prostate gland normal SKELETON: No focal hypermetabolic activity to suggest skeletal metastasis. Incidental CT findings: none IMPRESSION: 1. No evidence lymphoma recurrence on skull base to mid thigh FDG PET-CT scan. 2. Bilateral pleural effusions which are small to moderate. Electronically Signed   By: Suzy Bouchard M.D.   On: 09/01/2017 11:40   Dg Chest Port 1 View  Result Date: 09/09/2017 CLINICAL DATA:  Followup TAVR. EXAM: PORTABLE CHEST 1 VIEW COMPARISON:  09/05/2017 FINDINGS: Artifact overlies the chest. Power port is in place. Right internal jugular central line tip is in the SVC 3 cm above the right atrium. TAVR device in place. Previous median sternotomy and CABG. Aortic atherosclerosis. Mild interstitial edema. No consolidation or collapse. IMPRESSION: TAVR device in place.  Mild edema. Electronically Signed   By: Nelson Chimes M.D.   On: 09/09/2017 10:37   Dg Chest Port 1 View  Result Date: 08/19/2017 CLINICAL DATA:  Respiratory failure EXAM: PORTABLE CHEST 1 VIEW COMPARISON:  08/17/2017 FINDINGS: Prior CABG. Mild hyperinflation/COPD. Right Port-A-Cath is unchanged. Cardiomegaly. No confluent opacities or effusions. No acute bony abnormality. IMPRESSION: No acute findings.  No change. Electronically Signed   By: Rolm Baptise M.D.   On: 08/19/2017 07:14   Dg Chest Port 1 View  Result Date: 08/17/2017 CLINICAL DATA:  Acute respiratory failure. EXAM: PORTABLE CHEST 1 VIEW COMPARISON:  08/16/2017 FINDINGS: Right subclavian Port-A-Cath unchanged with tip over the SVC  just above the cavoatrial junction. Lungs are adequately inflated without focal consolidation or effusion. Remainder of the exam is unchanged. IMPRESSION: No acute cardiopulmonary disease. Right subclavian Port-A-Cath unchanged. Electronically Signed   By: Marin Olp M.D.   On: 08/17/2017 08:38   Dg Chest Portable 1 View  Result Date: 08/16/2017 CLINICAL DATA:  Diarrhea, shortness of breath for 2 days. EXAM: PORTABLE CHEST 1 VIEW COMPARISON:  07/23/2017 FINDINGS: The right chest wall port a catheter is noted with tip at the cavoatrial junction. Previous median sternotomy and CABG procedure. Aortic atherosclerosis noted. Stable moderate cardiac enlargement. No pleural effusion or edema. No airspace opacities identified. IMPRESSION: 1. Cardiac enlargement.  No acute findings. 2.  Aortic Atherosclerosis (ICD10-I70.0). Electronically Signed   By: Kerby Moors M.D.   On: 08/16/2017 19:20   Ct Renal Stone Study  Result Date: 08/16/2017 CLINICAL DATA:  Diarrhea and dyspnea x2 days. EXAM: CT ABDOMEN AND PELVIS WITHOUT CONTRAST TECHNIQUE: Multidetector CT imaging of the abdomen and pelvis was performed following the standard protocol without IV contrast. COMPARISON:  None. FINDINGS: Lower chest: Low-density appearance of the blood pool within the cardiac chambers with visualization of the interventricular septum compatible with anemia. Bibasilar dependent atelectasis. Small hiatal hernia. Hepatobiliary: No focal liver abnormality is seen. No gallstones, gallbladder wall thickening, or biliary dilatation. Pancreas: Unremarkable. No pancreatic ductal dilatation or surrounding inflammatory changes. Spleen: Normal in size without focal abnormality. Adrenals/Urinary Tract: Normal bilateral adrenal glands. No nephrolithiasis nor obstructive uropathy. Physiologic distention of the urinary bladder without focal mural thickening or calculus. Several small bladder diverticula are noted along the dome and posterior wall.  Stomach/Bowel: Stomach is within normal limits. Appendix is surgically absent by report. No evidence of bowel wall thickening, distention, or inflammatory changes. Vascular/Lymphatic: Moderate aortoiliac atherosclerosis without aneurysm. Atherosclerotic calcifications at the origins of both renal arteries without significant renal cortical scarring. Mild ectasia of the right common iliac artery to 2.2 cm. No pelvic sidewall, inguinal nor abdominal adenopathy. Reproductive: Mild enlargement of the prostate impressing upon the base of the bladder measuring up to 5.9 cm craniocaudad. Normal seminal vesicles. Other: No free air nor free fluid. Musculoskeletal: No acute nor suspicious osseous abnormalities. IMPRESSION: 1. Visualization of the interventricular septum with hypodense appearance of the blood pool compatible with anemia. 2. Bladder diverticulosis. 3. No acute bowel inflammation or inflammation. 4. Moderate aortoiliac and branch vessel atherosclerosis. Electronically Signed   By: Ashley Royalty M.D.   On: 08/16/2017 20:24    EKG:2:1 heart block, rate 47 (personally reviewed)  TELEMETRY: sinus rhythm with intermittent V pacing  (personally reviewed)  Assessment/Plan: 1.  2:1 heart block s/p TAVR The patient has 2:1 heart block s/p TAVR. Pre procedure, he was bradycardic with blocked PACs on prior EKG's.  He is on no AVN blocking agents. I think pacemaker implantation is reasonable. He is willing to proceed. Will plan for later today pending Dr Olin Pia evaluation NPO after clear liquid breakfast   2.  Severe AS S/p TAVR  Symptomatically improved   Dr Caryl Comes to see later today   Signed, Chanetta Marshall, NP 09/10/2017 8:47 AM   Aortic stenosis status post TAVR   Atrial Fibrillation paroxysmal   Intemittent complete heart block  CAD s/p CABG  OSA on CPAP  Bradycardia and MBZ1 2AVB   The pt has high grade heart block persisting now 24 hrs post TAVR  According most recent expert  guidelines, pacing at this point is reasonable.   He also has a secondary indication related to his previous and ongoing sinus node dysfunction  He has paroxysmal atrial fib, presumed new  This is a high risk situation for thromboembolism and have reviewed with Dr Elder Love who agrees with the use of DOAC and ASA and not clopidogrel .  Given his pancytopenia, have also reached out to oncology, Dr PE.    No CI per him for [facility]  The benefits and risks were reviewed including but not limited to death,  perforation, infection, lead dislodgement and device malfunction.  The patient understands agrees and is willing to proceed.  Will anticipate DOAC tomorrow for TE risk reduction      .

## 2017-09-10 NOTE — Interval H&P Note (Signed)
History and Physical Interval Note:  09/10/2017 1:50 PM  Richard [name]  has presented today for surgery, with the diagnosis of hb  The various methods of treatment have been discussed with the patient and family. After consideration of risks, benefits and other options for treatment, the patient has consented to  Procedure(s): PACEMAKER IMPLANT (N/A) as a surgical intervention .  The patient's history has been reviewed, patient examined, no change in status, stable for surgery.  I have reviewed the patient's chart and labs.  Questions were answered to the patient's satisfaction.     Richard [name]

## 2017-09-10 NOTE — Consult Note (Signed)
Referral MD  Reason for Referral: History of non-Hodgkin's lymphoma, status post chemotherapy; aortic valve repair via T AVR  No chief complaint on file. : I am glad to see you today doc  HPI: Richard [name] is well-known to me.  He is a very nice 77 year old white male.  He has a history of diffuse non-Hodgkin's lymphoma.  This is a large cell lymphoma.  He had this mostly in his bones.  He has been in remission.  He has had 8 cycles of chemotherapy with R-CHOP.  He got his last chemotherapy about 6 weeks ago.  He had a PET scan done about 2 weeks ago which showed remission.  He does have B12 deficiency and iron deficiency.  He received IV iron on Monday.  He did receive his aortic valve repair yesterday.  He was done via T AVR.  I am incredibly impressed with thoracic surgery.  He has a pacemaker right now.  His lab work yesterday showed a white cell count of 3.2.  Hemoglobin 8.6.  Platelet count 91,000.  There is no problems with bleeding with the procedure.  He has not gotten out of bed yet.  I am just to follow-up with him to make sure his blood counts remain adequate for recovery.  None   Past Medical History:  Diagnosis Date  . Anemia   . Axillary adenopathy 02/25/2017  . Bradycardia    a. holter monitor has demonstrated HRs in 30s and [place]   . CAD (coronary artery disease)    a. s/p CABG 2001  . Chronic lower back pain   . Diffuse large B cell lymphoma ([place])   . Diverticulosis   . Esophageal stricture   . GERD (gastroesophageal reflux disease)   . History of gout   . HTN (hypertension)   . Mixed hyperlipidemia   . OSA on CPAP    with 2L O2 at night  . Pancytopenia ([place])    a. related to chemo therapy for B cell lymphoma  . Peptic stricture of esophagus   . Severe aortic stenosis   . Spinal stenosis   . Type II diabetes mellitus ([place])   :  Past Surgical History:  Procedure Laterality Date  . APPENDECTOMY  ~ 1952  . BACK SURGERY    . CATARACT EXTRACTION  W/PHACO Left 11/06/2016   Procedure: CATARACT EXTRACTION PHACO AND INTRAOCULAR LENS PLACEMENT (IOC);  Surgeon: Estill Cotta, MD;  Location: [facility];  Service: Ophthalmology;  Laterality: Left;  Lot # [id number] H [place]: 01:09.4 AP%:25.2 CDE: 30.64  . CATARACT EXTRACTION W/PHACO Right 12/04/2016   Procedure: CATARACT EXTRACTION PHACO AND INTRAOCULAR LENS PLACEMENT (IOC);  Surgeon: Estill Cotta, MD;  Location: [facility];  Service: Ophthalmology;  Laterality: Right;  [place] 1:25.9 AP% 24.1 CDE 39.10 Fluid Pack lot # [id number]  . COLONOSCOPY W/ BIOPSIES AND POLYPECTOMY  2013  . CORONARY ANGIOPLASTY  1993  . CORONARY ANGIOPLASTY WITH STENT PLACEMENT  05/1997   "1"  . CORONARY ARTERY BYPASS GRAFT  03/2000   "CABG X5"  . ESOPHAGEAL DILATION  X 3-4   Dr. Lyla Son; "last one was in the 1990's"  . ESOPHAGOGASTRODUODENOSCOPY     multiple  . FLEXIBLE SIGMOIDOSCOPY     multiple  . HYDRADENITIS EXCISION Left 02/25/2017   Procedure: EXCISION DEEP LEFT AXILLARY LYMPH NODE;  Surgeon: Fanny Skates, MD;  Location: [place];  Service: General;  Laterality: Left;  . KNEE ARTHROSCOPY Left 2011   meniscus repair  . LEFT HEART CATHETERIZATION WITH  CORONARY/GRAFT ANGIOGRAM N/A 03/16/2014   Procedure: LEFT HEART CATHETERIZATION WITH Beatrix Fetters;  Surgeon: Burnell Blanks, MD;  Location: [facility] CATH LAB;  Service: Cardiovascular;  Laterality: N/A;  . LUMBAR LAMINECTOMY/DECOMPRESSION MICRODISCECTOMY Right 06/17/2013   Procedure: LUMBAR LAMINECTOMY MICRODISCECTOMY L4-L5 RIGHT EXCISION OF SYNOVIAL CYST RIGHT   (1 LEVEL) RIGHT PARTIAL FACETECTOMY;  Surgeon: Tobi Bastos, MD;  Location: [facility];  Service: Orthopedics;  Laterality: Right;  . MYELOGRAM  04/06/13   lumbar, Dr Gladstone Lighter  . PANENDOSCOPY    . PORTACATH PLACEMENT N/A 03/06/2017   Procedure: INSERTION PORT-A-CATH AND ASPIRATE SEROMA LEFT AXILLA;  Surgeon: Fanny Skates, MD;  Location: [place];  Service: General;  Laterality: N/A;  . RIGHT/LEFT  HEART CATH AND CORONARY/GRAFT ANGIOGRAPHY N/A 07/28/2017   Procedure: RIGHT/LEFT HEART CATH AND CORONARY/GRAFT ANGIOGRAPHY;  Surgeon: Sherren Mocha, MD;  Location: [place] CV LAB;  Service: Cardiovascular;  Laterality: N/A;  . SHOULDER SURGERY Right 08/2010   screws placed; "tendons tore off"  . SKIN CANCER EXCISION Right    "neck"  . TONSILLECTOMY  ~ 1954  . UPPER GI ENDOSCOPY  2013   Gastritis; Dr Carlean Purl  . VASECTOMY    :   Current Facility-Administered Medications:  .  0.9 %  sodium chloride infusion, , Intravenous, Continuous, Burnell Blanks, MD, Last Rate: 10 mL/hr at 09/10/17 0600 .  0.9 %  sodium chloride infusion, , Intravenous, Continuous, Burnell Blanks, MD, Last Rate: 10 mL/hr at 09/10/17 0600 .  acetaminophen (TYLENOL) tablet 1,000 mg, 1,000 mg, Oral, Q6H, 1,000 mg at 09/10/17 0521 **OR** acetaminophen (TYLENOL) solution 1,000 mg, 1,000 mg, Per Tube, Q6H, McAlhany, Christopher D, MD .  albumin human 5 % solution 250 mL, 250 mL, Intravenous, Q15 min PRN, Burnell Blanks, MD .  allopurinol (ZYLOPRIM) tablet 450 mg, 450 mg, Oral, Daily, McAlhany, Christopher D, MD .  amLODipine (NORVASC) tablet 5 mg, 5 mg, Oral, BID, Burnell Blanks, MD, 5 mg at 09/09/17 2101 .  aspirin EC tablet 81 mg, 81 mg, Oral, Daily, McAlhany, Christopher D, MD .  atorvastatin (LIPITOR) tablet 80 mg, 80 mg, Oral, QHS, Burnell Blanks, MD, 80 mg at 09/09/17 2103 .  ceFAZolin (ANCEF) IVPB 2g/100 mL premix, 2 g, Intravenous, Q8H, Burnell Blanks, MD, Last Rate: 200 mL/hr at 09/10/17 0521, 2 g at 09/10/17 0521 .  Chlorhexidine Gluconate Cloth 2 % PADS 6 each, 6 each, Topical, Daily, McAlhany, Christopher D, MD .  clopidogrel (PLAVIX) tablet 75 mg, 75 mg, Oral, Q breakfast, McAlhany, Christopher D, MD .  ezetimibe (ZETIA) tablet 10 mg, 10 mg, Oral, Daily, McAlhany, Christopher D, MD .  fluticasone (FLONASE) 50 MCG/ACT nasal spray 1 spray, 1 spray, Each Nare,  BID, Burnell Blanks, MD, 1 spray at 09/09/17 1131 .  furosemide (LASIX) tablet 120 mg, 120 mg, Oral, BID, Burnell Blanks, MD, 120 mg at 09/09/17 1824 .  glimepiride (AMARYL) tablet 2 mg, 2 mg, Oral, Q breakfast, McAlhany, Christopher D, MD .  guaiFENesin [facility]) 12 hr tablet 600 mg, 600 mg, Oral, BID, Prescott Gum, Collier Salina, MD, 600 mg at 09/09/17 2101 .  lactated ringers infusion 500 mL, 500 mL, Intravenous, Once PRN, Burnell Blanks, MD .  lipase/protease/amylase (CREON) capsule 72,000 Units, 72,000 Units, Oral, TID WC, Burnell Blanks, MD, 72,000 Units at 09/09/17 1839 .  MEDLINE mouth rinse, 15 mL, Mouth Rinse, BID, Burnell Blanks, MD, 15 mL at 09/09/17 2107 .  metoprolol tartrate (LOPRESSOR) injection 2.5-5 mg, 2.5-5 mg,  Intravenous, Q2H PRN, Burnell Blanks, MD .  midazolam (VERSED) injection 2 mg, 2 mg, Intravenous, Q1H PRN, Burnell Blanks, MD .  morphine 2 MG/ML injection 2-5 mg, 2-5 mg, Intravenous, Q1H PRN, Burnell Blanks, MD .  norepinephrine (LEVOPHED) 4 mg in dextrose 5 % 250 mL (0.016 mg/mL) infusion, 0-40 mcg/min, Intravenous, Titrated, Burnell Blanks, MD, Stopped at 09/09/17 1501 .  ondansetron (ZOFRAN) injection 4 mg, 4 mg, Intravenous, Q6H PRN, Burnell Blanks, MD .  oxyCODONE (Oxy IR/ROXICODONE) immediate release tablet 5-10 mg, 5-10 mg, Oral, Q3H PRN, Burnell Blanks, MD, 5 mg at 09/10/17 0018 .  pantoprazole (PROTONIX) EC tablet 40 mg, 40 mg, Oral, Daily, McAlhany, Christopher D, MD .  potassium chloride SA (K-DUR,KLOR-CON) CR tablet 40 mEq, 40 mEq, Oral, BID, Burnell Blanks, MD, 40 mEq at 09/09/17 2101 .  pregabalin (LYRICA) capsule 75 mg, 75 mg, Oral, TID, Burnell Blanks, MD, 75 mg at 09/09/17 2102 .  sodium chloride flush (NS) 0.9 % injection 10-40 mL, 10-40 mL, Intracatheter, Q12H, Burnell Blanks, MD, 10 mL at 09/09/17 2103 .  sodium chloride flush (NS) 0.9 %  injection 10-40 mL, 10-40 mL, Intracatheter, PRN, Burnell Blanks, MD .  traMADol Veatrice Bourbon) tablet 50-100 mg, 50-100 mg, Oral, Q4H PRN, Burnell Blanks, MD .  valACYclovir (VALTREX) tablet 1,000 mg, 1,000 mg, Oral, Daily, Burnell Blanks, MD, 1,000 mg at 09/09/17 1559  Facility-Administered Medications Ordered in Other Encounters:  .  sodium chloride flush (NS) 0.9 % injection 10 mL, 10 mL, Intravenous, PRN, [name], MD, 10 mL at 05/30/17 0920:  . acetaminophen  1,000 mg Oral Q6H   Or  . acetaminophen (TYLENOL) oral liquid 160 mg/5 mL  1,000 mg Per Tube Q6H  . allopurinol  450 mg Oral Daily  . amLODipine  5 mg Oral BID  . aspirin EC  81 mg Oral Daily  . atorvastatin  80 mg Oral QHS  . Chlorhexidine Gluconate Cloth  6 each Topical Daily  . clopidogrel  75 mg Oral Q breakfast  . ezetimibe  10 mg Oral Daily  . fluticasone  1 spray Each Nare BID  . furosemide  120 mg Oral BID  . glimepiride  2 mg Oral Q breakfast  . guaiFENesin  600 mg Oral BID  . lipase/protease/amylase  72,000 Units Oral TID WC  . mouth rinse  15 mL Mouth Rinse BID  . pantoprazole  40 mg Oral Daily  . potassium chloride SA  40 mEq Oral BID  . pregabalin  75 mg Oral TID  . sodium chloride flush  10-40 mL Intracatheter Q12H  . valACYclovir  1,000 mg Oral Daily  :  Allergies  Allergen Reactions  . Benazepril Swelling    angioedema; he is not a candidate for any angiotensin receptor blockers because of this significant allergic reaction. Because of a history of documented adverse serious drug reaction;Medi Alert bracelet  is recommended  . Hctz [Hydrochlorothiazide] Anaphylaxis and Swelling    Tongue and lip swelling   . Aspirin Other (See Comments)    Gastritis, cant take 325 Mg aspirin   . Lactose Intolerance (Gi) Nausea And Vomiting  :  Family History  Problem Relation Age of Onset  . Stroke Father   . Hypertension Father   . Pancreatic cancer Mother   . Diabetes Maternal  Grandmother   . Stroke Maternal Grandmother   . Heart attack Paternal Grandmother   . Colon cancer Neg Hx   .  Esophageal cancer Neg Hx   . Rectal cancer Neg Hx   . Stomach cancer Neg Hx   . Ulcers Neg Hx   :  Social History   Socioeconomic History  . Marital status: Divorced    Spouse name: Not on file  . Number of children: 2  . Years of education: Not on file  . Highest education level: Not on file  Social Needs  . Financial resource strain: Not on file  . Food insecurity - worry: Not on file  . Food insecurity - inability: Not on file  . Transportation needs - medical: Not on file  . Transportation needs - non-medical: Not on file  Occupational History    Employer: RETIRED  Tobacco Use  . Smoking status: Never Smoker  . Smokeless tobacco: Never Used  Substance and Sexual Activity  . Alcohol use: No    Alcohol/week: 0.0 oz    Comment: "last drink was in 2012"( 03/15/2014)  . Drug use: No  . Sexual activity: Not Currently  Other Topics Concern  . Not on file  Social History Narrative   Divorced, lives with a roommate.   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  :  Pertinent items are noted in HPI.  Exam: Patient Vitals for the past 24 hrs:  BP Temp Temp src Pulse Resp SpO2 Weight  09/10/17 0600 (!) 112/54 - - (!) 59 17 (!) 88 % -  09/10/17 0500 133/78 - - 63 19 (!) 88 % 249 lb 9 oz (113.2 kg)  09/10/17 0400 (!) 117/53 98.1 F (36.7 C) Oral 67 17 97 % -  09/10/17 0300 (!) 113/52 - - 65 17 97 % -  09/10/17 0200 (!) 112/49 - - 60 20 94 % -  09/10/17 0100 (!) 114/48 - - (!) 59 18 96 % -  09/10/17 0000 (!) 135/44 98.4 F (36.9 C) Oral (!) 46 17 94 % -  09/09/17 2300 (!) 119/58 - - (!) 50 (!) 21 96 % -  09/09/17 2200 (!) 111/55 - - (!) 49 18 95 % -  09/09/17 2100 (!) 121/53 - - (!) 49 18 98  % -  09/09/17 2000 (!) 112/49 98.4 F (36.9 C) Oral 62 19 95 % -  09/09/17 1900 (!) 102/50 - - (!) 49 18 99 % -  09/09/17 1800 (!) 103/54 - - (!) 49 20 100 % -  09/09/17 1701 (!) 112/52 98.2 F (36.8 C) Oral - - - -  09/09/17 1700 (!) 111/52 - - (!) 52 (!) 21 97 % -  09/09/17 1600 (!) 104/56 - - (!) 49 (!) 21 98 % -  09/09/17 1545 - - - (!) 33 (!) 24 97 % -  09/09/17 1530 - - - 97 (!) 21 98 % -  09/09/17 1515 - - - (!) 54 19 97 % -  09/09/17 1500 (!) 102/55 - - (!) 49 18 97 % -  09/09/17 1400 (!) 89/49 - - (!) 37 15 (!) 89 % -  09/09/17 1300 (!) 104/54 98.2 F (36.8 C) Oral (!) 31 (!) 23 97 % -  09/09/17 1200 97/60 - - (!) 54 (!) 21 100 % -  09/09/17 1145 - - - (!) 49 16 100 % -  09/09/17 1130 - - - 84 19  91 % -  09/09/17 1115 - - - (!) 56 16 98 % -  09/09/17 1100 (!) 114/59 - - (!) 46 20 90 % -  09/09/17 1045 - - - (!) 51 18 93 % -  09/09/17 1030 - - - 74 (!) 22 94 % -  09/09/17 1015 (!) 98/52 - - 79 (!) 23 91 % -  09/09/17 1000 - - - 77 (!) 22 - -     Recent Labs    09/09/17 0953 09/09/17 0958  WBC 3.2*  --   HGB 8.6* 9.2*  HCT 29.4* 27.0*  PLT 91*  --    Recent Labs    09/09/17 0922 09/09/17 0958 09/10/17 0340  NA 137 138 136  K 3.8 3.8 3.9  CL 98*  --  104  CO2  --   --  23  GLUCOSE 209* 177* 122*  BUN 12  --  12  CREATININE 0.80  --  0.91  CALCIUM  --   --  8.0*    Blood smear review: None  Pathology: None    Assessment and Plan: Mr. Holan is a 77 year old white male.  He has a history of diffuse large cell non-Hodgkin's lymphoma.  He is in remission.  We got him through all his treatments despite him having severe aortic stenosis.  We will see what his blood counts are.  I will follow-up with his iron levels.  I think that his quality of life will be a whole lot better now that he has had his aortic valve repaired.  I very much appreciate all the great work that thoracic surgery is done on him.  I know that the staff in the cardiac ICU will  give him fantastic care.  Richard Haw, MD  Richard [name] 41:10

## 2017-09-10 NOTE — H&P (View-Only) (Signed)
ELECTROPHYSIOLOGY CONSULT NOTE    Patient ID: Richard [name] MRN: [id number], DOB/AGE: October 12, 1940 77 y.o.  Admit date: 09/09/2017 Date of Consult: 09/10/2017  Primary Physician: Carollee Herter, Alferd Apa, DO Primary Cardiologist: Percival Spanish Structural Heart: Angelena Form   Electrophysiologist: Caryl Comes (new this admission)  Patient Profile: Richard [name] is a 77 y.o. male with a history of chronic diastolic heart failure, large B cell lymphoma, severe AS s/p TAVR this admission, and bradycardia who is being seen today for the evaluation of heart block s/p TAVR at the request of Dr Angelena Form.  HPI:  Richard [name] is a 77 y.o. male with the above past medical history.  He has completed therapy for large B cell lymphoma and has become more symptomatic with his AS.  He underwent TAVR yesterday with subsequent heart block that has persisted this morning. He is on no AVN blocking agents.  EP has been asked to evaluate for treatment options.   He feels significantly improved post TAVR with improved energy and appetite. He is currently having back pain 2/2 laying in the bed.   He denies chest pain, palpitations, dyspnea, PND, orthopnea, nausea, vomiting, dizziness, syncope, edema, weight gain, or early satiety.  Past Medical History:  Diagnosis Date  . Anemia   . Axillary adenopathy 02/25/2017  . Bradycardia    a. holter monitor has demonstrated HRs in 30s and [place]   . CAD (coronary artery disease)    a. s/p CABG 2001  . Chronic lower back pain   . Diffuse large B cell lymphoma ([place])   . Diverticulosis   . Esophageal stricture   . GERD (gastroesophageal reflux disease)   . History of gout   . HTN (hypertension)   . Mixed hyperlipidemia   . OSA on CPAP    with 2L O2 at night  . Pancytopenia ([place])    a. related to chemo therapy for B cell lymphoma  . Peptic stricture of esophagus   . Severe aortic stenosis   . Spinal stenosis   . Type II diabetes mellitus ([place])      Surgical  History:  Past Surgical History:  Procedure Laterality Date  . APPENDECTOMY  ~ 1952  . BACK SURGERY    . CATARACT EXTRACTION W/PHACO Left 11/06/2016   Procedure: CATARACT EXTRACTION PHACO AND INTRAOCULAR LENS PLACEMENT (IOC);  Surgeon: Estill Cotta, MD;  Location: [facility] ORS;  Service: Ophthalmology;  Laterality: Left;  Lot # [id number] H [place]: 01:09.4 AP%:25.2 CDE: 30.64  . CATARACT EXTRACTION W/PHACO Right 12/04/2016   Procedure: CATARACT EXTRACTION PHACO AND INTRAOCULAR LENS PLACEMENT (IOC);  Surgeon: Estill Cotta, MD;  Location: [facility];  Service: Ophthalmology;  Laterality: Right;  [place] 1:25.9 AP% 24.1 CDE 39.10 Fluid Pack lot # [id number]  . COLONOSCOPY W/ BIOPSIES AND POLYPECTOMY  2013  . CORONARY ANGIOPLASTY  1993  . CORONARY ANGIOPLASTY WITH STENT PLACEMENT  05/1997   "1"  . CORONARY ARTERY BYPASS GRAFT  03/2000   "CABG X5"  . ESOPHAGEAL DILATION  X 3-4   Dr. Lyla Son; "last one was in the 1990's"  . ESOPHAGOGASTRODUODENOSCOPY     multiple  . FLEXIBLE SIGMOIDOSCOPY     multiple  . HYDRADENITIS EXCISION Left 02/25/2017   Procedure: EXCISION DEEP LEFT AXILLARY LYMPH NODE;  Surgeon: Fanny Skates, MD;  Location: [place];  Service: General;  Laterality: Left;  . KNEE ARTHROSCOPY Left 2011   meniscus repair  . LEFT HEART CATHETERIZATION WITH CORONARY/GRAFT ANGIOGRAM N/A 03/16/2014   Procedure: LEFT HEART CATHETERIZATION  WITH Beatrix Fetters;  Surgeon: Burnell Blanks, MD;  Location: [facility] CATH LAB;  Service: Cardiovascular;  Laterality: N/A;  . LUMBAR LAMINECTOMY/DECOMPRESSION MICRODISCECTOMY Right 06/17/2013   Procedure: LUMBAR LAMINECTOMY MICRODISCECTOMY L4-L5 RIGHT EXCISION OF SYNOVIAL CYST RIGHT   (1 LEVEL) RIGHT PARTIAL FACETECTOMY;  Surgeon: Tobi Bastos, MD;  Location: [facility];  Service: Orthopedics;  Laterality: Right;  . MYELOGRAM  04/06/13   lumbar, Dr Gladstone Lighter  . PANENDOSCOPY    . PORTACATH PLACEMENT N/A 03/06/2017   Procedure: INSERTION PORT-A-CATH AND  ASPIRATE SEROMA LEFT AXILLA;  Surgeon: Fanny Skates, MD;  Location: [place];  Service: General;  Laterality: N/A;  . RIGHT/LEFT HEART CATH AND CORONARY/GRAFT ANGIOGRAPHY N/A 07/28/2017   Procedure: RIGHT/LEFT HEART CATH AND CORONARY/GRAFT ANGIOGRAPHY;  Surgeon: Sherren Mocha, MD;  Location: [facility] CV LAB;  Service: Cardiovascular;  Laterality: N/A;  . SHOULDER SURGERY Right 08/2010   screws placed; "tendons tore off"  . SKIN CANCER EXCISION Right    "neck"  . TONSILLECTOMY  ~ 1954  . UPPER GI ENDOSCOPY  2013   Gastritis; Dr Carlean Purl  . VASECTOMY       Medications Prior to Admission  Medication Sig Dispense Refill Last Dose  . allopurinol (ZYLOPRIM) 300 MG tablet Take 450 mg by mouth daily.    09/08/2017 at Unknown time  . amLODipine (NORVASC) 5 MG tablet TAKE 2 TABLETS (10 MG TOTAL) BY MOUTH DAILY. (Patient taking differently: Take 1 tablet BID) 60 tablet 11 09/08/2017 at Unknown time  . aspirin 81 MG tablet Take 81 mg by mouth daily.   Past Week at Unknown time  . atorvastatin (LIPITOR) 80 MG tablet TAKE ONE TABLET BY MOUTH AT BEDTIME 90 tablet 3 09/08/2017 at Unknown time  . esomeprazole (NEXIUM) 40 MG capsule Take 1 capsule (40 mg total) by mouth daily. 30 capsule 5 09/08/2017 at Unknown time  . ezetimibe (ZETIA) 10 MG tablet TAKE ONE TABLET BY MOUTH DAILY 90 tablet 3 09/08/2017 at Unknown time  . famciclovir (FAMVIR) 500 MG tablet Take 1 tablet (500 mg total) by mouth daily. 30 tablet 12 09/08/2017 at Unknown time  . fluticasone (FLONASE) 50 MCG/ACT nasal spray INHALE ONE SPRAY INTO EACH NOSTRIL TWICE DAILY. 16 g 6 Past Week at Unknown time  . furosemide (LASIX) 40 MG tablet Take 120 mg by mouth 2 (two) times daily.   09/08/2017 at Unknown time  . glimepiride (AMARYL) 2 MG tablet TAKE 1 TABLET (2 MG TOTAL) BY MOUTH DAILY WITH BREAKFAST. 90 tablet 0 Past Week at Unknown time  . lipase/protease/amylase (CREON) 36000 UNITS CPEP capsule Take 2 capsules (72,000 Units total) by mouth 3 (three)  times daily with meals. 1 capsule with snack. 210 capsule 2 09/08/2017 at Unknown time  . METFORMIN HCL PO Take 40 mg by mouth 2 (two) times daily with a meal. Pt takes 1.5 tablets in the AM and 1 tablet in the PM   09/08/2017 at Unknown time  . ondansetron (ZOFRAN) 8 MG tablet Take 1 tablet (8 mg total) by mouth 2 (two) times daily as needed for refractory nausea / vomiting. Start on day 3 after cyclophosphamide chemotherapy. 30 tablet 1 Past Month at Unknown time  . polycarbophil (FIBERCON) 625 MG tablet Take 625 mg by mouth daily.   09/08/2017 at Unknown time  . potassium chloride SA (K-DUR,KLOR-CON) 20 MEQ tablet Take 40 mEq by mouth 2 (two) times daily.   09/08/2017 at Unknown time  . pregabalin (LYRICA) 75 MG capsule Take 1 capsule (75  mg total) by mouth 3 (three) times daily. 270 capsule 3 09/08/2017 at Unknown time  . psyllium (METAMUCIL) 58.6 % powder Take 1 packet by mouth 3 (three) times daily.   09/08/2017 at Unknown time  . glucose blood (FREESTYLE LITE) test strip TEST BLOOD SUGAR bid 100 each 11 Taking  . Lancets (FREESTYLE) lancets Use BID to check blood sugar.  DX E11.9 100 each 6 Taking  . lidocaine-prilocaine (EMLA) cream Apply to affected area once (Patient taking differently: Apply 1 application topically as needed (to port). ) 30 g 3 Taking  . LORazepam (ATIVAN) 0.5 MG tablet Take 0.5 mg by mouth every 6 (six) hours as needed for anxiety.   Taking  . NEOMYCIN-POLYMYXIN-HYDROCORTISONE (CORTISPORIN) 1 % SOLN OTIC solution Apply 1-2 drops to toe BID after soaking 10 mL 1 Taking  . nitroGLYCERIN (NITROSTAT) 0.4 MG SL tablet Place 1 tablet (0.4 mg total) under the tongue every 5 (five) minutes as needed for chest pain. 25 tablet 3 Taking  . polyethylene glycol (MIRALAX / GLYCOLAX) packet Take 17 g by mouth daily. 14 each 0 Taking  . prochlorperazine (COMPAZINE) 10 MG tablet Take 1 tablet (10 mg total) by mouth every 6 (six) hours as needed (Nausea or vomiting). (Patient not taking: Reported  on 09/05/2017) 30 tablet 6 Not Taking  . senna-docusate (SENOKOT-S) 8.6-50 MG tablet Take 1 tablet by mouth 2 (two) times daily. 30 tablet 0 Taking    Inpatient Medications:  . acetaminophen  1,000 mg Oral Q6H   Or  . acetaminophen (TYLENOL) oral liquid 160 mg/5 mL  1,000 mg Per Tube Q6H  . allopurinol  450 mg Oral Daily  . amLODipine  5 mg Oral BID  . aspirin EC  81 mg Oral Daily  . atorvastatin  80 mg Oral QHS  . Chlorhexidine Gluconate Cloth  6 each Topical Daily  . clopidogrel  75 mg Oral Q breakfast  . ezetimibe  10 mg Oral Daily  . fluticasone  1 spray Each Nare BID  . furosemide  80 mg Intravenous BID  . glimepiride  2 mg Oral Q breakfast  . guaiFENesin  600 mg Oral BID  . lipase/protease/amylase  72,000 Units Oral TID WC  . mouth rinse  15 mL Mouth Rinse BID  . pantoprazole  40 mg Oral Daily  . potassium chloride SA  40 mEq Oral BID  . pregabalin  75 mg Oral TID  . sodium chloride flush  10-40 mL Intracatheter Q12H  . valACYclovir  1,000 mg Oral Daily    Allergies:  Allergies  Allergen Reactions  . Benazepril Swelling    angioedema; he is not a candidate for any angiotensin receptor blockers because of this significant allergic reaction. Because of a history of documented adverse serious drug reaction;Medi Alert bracelet  is recommended  . Hctz [Hydrochlorothiazide] Anaphylaxis and Swelling    Tongue and lip swelling   . Aspirin Other (See Comments)    Gastritis, cant take 325 Mg aspirin   . Lactose Intolerance (Gi) Nausea And Vomiting    Social History   Socioeconomic History  . Marital status: Divorced    Spouse name: Not on file  . Number of children: 2  . Years of education: Not on file  . Highest education level: Not on file  Social Needs  . Financial resource strain: Not on file  . Food insecurity - worry: Not on file  . Food insecurity - inability: Not on file  . Transportation needs - medical: Not  on file  . Transportation needs - non-medical: Not  on file  Occupational History    Employer: RETIRED  Tobacco Use  . Smoking status: Never Smoker  . Smokeless tobacco: Never Used  Substance and Sexual Activity  . Alcohol use: No    Alcohol/week: 0.0 oz    Comment: "last drink was in 2012"( 03/15/2014)  . Drug use: No  . Sexual activity: Not Currently  Other Topics Concern  . Not on file  Social History Narrative   Divorced, lives with a roommate.   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     Family History  Problem Relation Age of Onset  . Stroke Father   . Hypertension Father   . Pancreatic cancer Mother   . Diabetes Maternal Grandmother   . Stroke Maternal Grandmother   . Heart attack Paternal Grandmother   . Colon cancer Neg Hx   . Esophageal cancer Neg Hx   . Rectal cancer Neg Hx   . Stomach cancer Neg Hx   . Ulcers Neg Hx      Review of Systems: All other systems reviewed and are otherwise negative except as noted above.  Physical Exam: Vitals:   09/10/17 0600 09/10/17 0700 09/10/17 0800 09/10/17 0823  BP: (!) 112/54 (!) 116/50 (!) 123/56   Pulse: (!) 59 63 (!) 58   Resp: 17 17 19    Temp:    98.1 F (36.7 C)  TempSrc:    Oral  SpO2: (!) 88% 94% 90%   Weight:      Height:        GEN- The patient is elderly and obese appearing, alert and oriented x 3 today.   HEENT: normocephalic, atraumatic; sclera clear, conjunctiva pink; hearing intact; oropharynx clear; neck supple Lungs- Clear to ausculation bilaterally, normal work of breathing.  No wheezes, rales, rhonchi Heart- Regular rate and rhythm  GI- soft, non-tender, non-distended, bowel sounds present Extremities- no clubbing, cyanosis, or edema  MS- no significant deformity or atrophy Skin- warm and dry, no rash or lesion, right chest with port in place  Psych- euthymic mood, full  affect Neuro- strength and sensation are intact  Labs:   Lab Results  Component Value Date   WBC 2.2 (L) 09/10/2017   HGB 8.6 (L) 09/10/2017   HCT 28.3 (L) 09/10/2017   MCV 92.8 09/10/2017   PLT 82 (L) 09/10/2017    Recent Labs  Lab 09/05/17 1110  09/10/17 0340  NA 142   < > 136  K 3.6   < > 3.9  CL 103   < > 104  CO2 31  --  23  BUN 17   < > 12  CREATININE 1.00   < > 0.91  CALCIUM 9.5  --  8.0*  PROT 6.4  --   --   BILITOT 1.0  --   --   ALKPHOS 106*  --   --   ALT 19  --   --   AST 24  --   --   GLUCOSE 103   < > 122*   < > = values in this interval not displayed.      Radiology/Studies: Dg Chest 2 View  Result Date: 09/05/2017 CLINICAL DATA:  Severe aortic stenosis. EXAM: CHEST - 2 VIEW COMPARISON:  Chest x-ray  dated August 19, 2017. FINDINGS: Unchanged right chest wall port catheter with the tip at the cavoatrial junction. Stable mild cardiomegaly status post CABG. Normal pulmonary vascularity. Trace bilateral pleural effusions. No consolidation or pneumothorax. No acute osseous abnormality. IMPRESSION: Trace bilateral pleural effusions. Electronically Signed   By: Titus Dubin M.D.   On: 09/05/2017 15:27   Nm Pet Image Restag (ps) Skull Base To Thigh  Result Date: 09/01/2017 CLINICAL DATA:  Subsequent treatment strategy for diffuse large B-cell lymphoma. Status post 4 cycles first-line therapy. EXAM: NUCLEAR MEDICINE PET SKULL BASE TO THIGH TECHNIQUE: 12.6 mCi F-18 FDG was injected intravenously. Full-ring PET imaging was performed from the skull base to thigh after the radiotracer. CT data was obtained and used for attenuation correction and anatomic localization. Fasting blood glucose: 109 mg/dl Mediastinal blood pool activity: SUV max 2.3 COMPARISON:  3 PET-CT 04/15/2017, 817 18 FINDINGS: NECK: No hypermetabolic lymph nodes in the neck. Incidental CT findings: none CHEST: No hypermetabolic lymph nodes in the chest. No hypermetabolic axillary lymph nodes. Seroma in  the LEFT chest wall adjacent to the axilla at lymphadenectomy site. Incidental CT findings: Bilateral pleural effusions are small to moderate and greater on the RIGHT. No suspicious pulmonary nodularity. Port in the RIGHT chest wall with tip in distal SVC ABDOMEN/PELVIS: Normal size spleen with normal metabolic activity. No hypermetabolic abdominopelvic lymph nodes. Incidental CT findings: Atherosclerotic calcification of the aorta. Prostate gland normal SKELETON: No focal hypermetabolic activity to suggest skeletal metastasis. Incidental CT findings: none IMPRESSION: 1. No evidence lymphoma recurrence on skull base to mid thigh FDG PET-CT scan. 2. Bilateral pleural effusions which are small to moderate. Electronically Signed   By: Suzy Bouchard M.D.   On: 09/01/2017 11:40   Dg Chest Port 1 View  Result Date: 09/09/2017 CLINICAL DATA:  Followup TAVR. EXAM: PORTABLE CHEST 1 VIEW COMPARISON:  09/05/2017 FINDINGS: Artifact overlies the chest. Power port is in place. Right internal jugular central line tip is in the SVC 3 cm above the right atrium. TAVR device in place. Previous median sternotomy and CABG. Aortic atherosclerosis. Mild interstitial edema. No consolidation or collapse. IMPRESSION: TAVR device in place.  Mild edema. Electronically Signed   By: Nelson Chimes M.D.   On: 09/09/2017 10:37   Dg Chest Port 1 View  Result Date: 08/19/2017 CLINICAL DATA:  Respiratory failure EXAM: PORTABLE CHEST 1 VIEW COMPARISON:  08/17/2017 FINDINGS: Prior CABG. Mild hyperinflation/COPD. Right Port-A-Cath is unchanged. Cardiomegaly. No confluent opacities or effusions. No acute bony abnormality. IMPRESSION: No acute findings.  No change. Electronically Signed   By: Rolm Baptise M.D.   On: 08/19/2017 07:14   Dg Chest Port 1 View  Result Date: 08/17/2017 CLINICAL DATA:  Acute respiratory failure. EXAM: PORTABLE CHEST 1 VIEW COMPARISON:  08/16/2017 FINDINGS: Right subclavian Port-A-Cath unchanged with tip over the SVC  just above the cavoatrial junction. Lungs are adequately inflated without focal consolidation or effusion. Remainder of the exam is unchanged. IMPRESSION: No acute cardiopulmonary disease. Right subclavian Port-A-Cath unchanged. Electronically Signed   By: Marin Olp M.D.   On: 08/17/2017 08:38   Dg Chest Portable 1 View  Result Date: 08/16/2017 CLINICAL DATA:  Diarrhea, shortness of breath for 2 days. EXAM: PORTABLE CHEST 1 VIEW COMPARISON:  07/23/2017 FINDINGS: The right chest wall port a catheter is noted with tip at the cavoatrial junction. Previous median sternotomy and CABG procedure. Aortic atherosclerosis noted. Stable moderate cardiac enlargement. No pleural effusion or edema. No airspace opacities identified. IMPRESSION: 1. Cardiac enlargement.  No acute findings. 2.  Aortic Atherosclerosis (ICD10-I70.0). Electronically Signed   By: Kerby Moors M.D.   On: 08/16/2017 19:20   Ct Renal Stone Study  Result Date: 08/16/2017 CLINICAL DATA:  Diarrhea and dyspnea x2 days. EXAM: CT ABDOMEN AND PELVIS WITHOUT CONTRAST TECHNIQUE: Multidetector CT imaging of the abdomen and pelvis was performed following the standard protocol without IV contrast. COMPARISON:  None. FINDINGS: Lower chest: Low-density appearance of the blood pool within the cardiac chambers with visualization of the interventricular septum compatible with anemia. Bibasilar dependent atelectasis. Small hiatal hernia. Hepatobiliary: No focal liver abnormality is seen. No gallstones, gallbladder wall thickening, or biliary dilatation. Pancreas: Unremarkable. No pancreatic ductal dilatation or surrounding inflammatory changes. Spleen: Normal in size without focal abnormality. Adrenals/Urinary Tract: Normal bilateral adrenal glands. No nephrolithiasis nor obstructive uropathy. Physiologic distention of the urinary bladder without focal mural thickening or calculus. Several small bladder diverticula are noted along the dome and posterior wall.  Stomach/Bowel: Stomach is within normal limits. Appendix is surgically absent by report. No evidence of bowel wall thickening, distention, or inflammatory changes. Vascular/Lymphatic: Moderate aortoiliac atherosclerosis without aneurysm. Atherosclerotic calcifications at the origins of both renal arteries without significant renal cortical scarring. Mild ectasia of the right common iliac artery to 2.2 cm. No pelvic sidewall, inguinal nor abdominal adenopathy. Reproductive: Mild enlargement of the prostate impressing upon the base of the bladder measuring up to 5.9 cm craniocaudad. Normal seminal vesicles. Other: No free air nor free fluid. Musculoskeletal: No acute nor suspicious osseous abnormalities. IMPRESSION: 1. Visualization of the interventricular septum with hypodense appearance of the blood pool compatible with anemia. 2. Bladder diverticulosis. 3. No acute bowel inflammation or inflammation. 4. Moderate aortoiliac and branch vessel atherosclerosis. Electronically Signed   By: Ashley Royalty M.D.   On: 08/16/2017 20:24    EKG:2:1 heart block, rate 47 (personally reviewed)  TELEMETRY: sinus rhythm with intermittent V pacing  (personally reviewed)  Assessment/Plan: 1.  2:1 heart block s/p TAVR The patient has 2:1 heart block s/p TAVR. Pre procedure, he was bradycardic with blocked PACs on prior EKG's.  He is on no AVN blocking agents. I think pacemaker implantation is reasonable. He is willing to proceed. Will plan for later today pending Dr Olin Pia evaluation NPO after clear liquid breakfast   2.  Severe AS S/p TAVR  Symptomatically improved   Dr Caryl Comes to see later today   Signed, Chanetta Marshall, NP 09/10/2017 8:47 AM   Aortic stenosis status post TAVR   Atrial Fibrillation paroxysmal   Intemittent complete heart block  CAD s/p CABG  OSA on CPAP  Bradycardia and MBZ1 2AVB   The pt has high grade heart block persisting now 24 hrs post TAVR  According most recent expert  guidelines, pacing at this point is reasonable.   He also has a secondary indication related to his previous and ongoing sinus node dysfunction  He has paroxysmal atrial fib, presumed new  This is a high risk situation for thromboembolism and have reviewed with Dr Elder Love who agrees with the use of DOAC and ASA and not clopidogrel .  Given his pancytopenia, have also reached out to oncology, Dr PE.    No CI per him for [facility]  The benefits and risks were reviewed including but not limited to death,  perforation, infection, lead dislodgement and device malfunction.  The patient understands agrees and is willing to proceed.  Will anticipate DOAC tomorrow for TE risk reduction      .

## 2017-09-10 NOTE — Progress Notes (Signed)
Progress Note  Patient Name: Richard [name] Date of Encounter: 09/10/2017  Primary Cardiologist: Minus Breeding, MD   Subjective   He feels great this am. No chest pain or dyspnea. He is wearing supplemental O2 via a mask due to low oxygen sats. HR of 50s under pacemaker with 2:1 AV block.   Inpatient Medications    Scheduled Meds: . acetaminophen  1,000 mg Oral Q6H   Or  . acetaminophen (TYLENOL) oral liquid 160 mg/5 mL  1,000 mg Per Tube Q6H  . allopurinol  450 mg Oral Daily  . amLODipine  5 mg Oral BID  . aspirin EC  81 mg Oral Daily  . atorvastatin  80 mg Oral QHS  . Chlorhexidine Gluconate Cloth  6 each Topical Daily  . clopidogrel  75 mg Oral Q breakfast  . ezetimibe  10 mg Oral Daily  . fluticasone  1 spray Each Nare BID  . furosemide  120 mg Oral BID  . glimepiride  2 mg Oral Q breakfast  . guaiFENesin  600 mg Oral BID  . lipase/protease/amylase  72,000 Units Oral TID WC  . mouth rinse  15 mL Mouth Rinse BID  . pantoprazole  40 mg Oral Daily  . potassium chloride SA  40 mEq Oral BID  . pregabalin  75 mg Oral TID  . sodium chloride flush  10-40 mL Intracatheter Q12H  . valACYclovir  1,000 mg Oral Daily   Continuous Infusions: . sodium chloride 10 mL/hr at 09/10/17 0600  . sodium chloride 10 mL/hr at 09/10/17 0600  . albumin human    .  ceFAZolin (ANCEF) IV 2 g (09/10/17 0521)  . lactated ringers    . norepinephrine (LEVOPHED) Adult infusion Stopped (09/09/17 1501)   PRN Meds: albumin human, lactated ringers, metoprolol tartrate, midazolam, morphine injection, ondansetron (ZOFRAN) IV, oxyCODONE, sodium chloride flush, traMADol   Vital Signs    Vitals:   09/10/17 0400 09/10/17 0500 09/10/17 0600 09/10/17 0700  BP: (!) 117/53 133/78 (!) 112/54 (!) 116/50  Pulse: 67 63 (!) 59 63  Resp: 17 19 17 17   Temp: 98.1 F (36.7 C)     TempSrc: Oral     SpO2: 97% (!) 88% (!) 88% 94%  Weight:  249 lb 9 oz (113.2 kg)    Height:        Intake/Output Summary (Last  24 hours) at 09/10/2017 0737 Last data filed at 09/10/2017 0600 Gross per 24 hour  Intake 1992.47 ml  Output 2850 ml  Net -857.53 ml   Filed Weights   09/09/17 0620 09/10/17 0500  Weight: 246 lb 3.2 oz (111.7 kg) 249 lb 9 oz (113.2 kg)    Telemetry    Sinus, paced beats, brady- Personally Reviewed  ECG    Sinus, 2:1 AV block, HR 50s - Personally Reviewed  Physical Exam   GEN: No acute distress.   Neck: No JVD Cardiac: Regular with brady, soft systolic murmur.   Respiratory: Clear to auscultation bilaterally. GI: Soft, nontender, non-distended  Ext: Groins without hematoma, soft.  Neuro:  Nonfocal  Psych: Normal affect   Labs    Chemistry Recent Labs  Lab 09/05/17 1110  09/09/17 0903 09/09/17 0922 09/09/17 0958 09/10/17 0340  NA 142   < > 137 137 138 136  K 3.6   < > 4.1 3.8 3.8 3.9  CL 103   < > 99* 98*  --  104  CO2 31  --   --   --   --  23  GLUCOSE 103   < > 206* 209* 177* 122*  BUN 17   < > 12 12  --  12  CREATININE 1.00   < > 0.90 0.80  --  0.91  CALCIUM 9.5  --   --   --   --  8.0*  PROT 6.4  --   --   --   --   --   ALBUMIN 3.5  --   --   --   --   --   AST 24  --   --   --   --   --   ALT 19  --   --   --   --   --   ALKPHOS 106*  --   --   --   --   --   BILITOT 1.0  --   --   --   --   --   GFRNONAA  --   --   --   --   --  >60  GFRAA  --   --   --   --   --  >60  ANIONGAP 8  --   --   --   --  9   < > = values in this interval not displayed.     Hematology Recent Labs  Lab 09/05/17 1110  09/09/17 0922 09/09/17 0953 09/09/17 0958  WBC 3.6*  --   --  3.2*  --   RBC 3.26*  --   --  3.20*  --   HGB  --    < > 8.5* 8.6* 9.2*  HCT 30.8*   < > 25.0* 29.4* 27.0*  MCV 94.5  --   --  91.9  --   MCH 28.8  --   --  26.9  --   MCHC 30.5*  --   --  29.3*  --   RDW 18.7*  --   --  18.4*  --   PLT 94*  --   --  91*  --    < > = values in this interval not displayed.    Cardiac EnzymesNo results for input(s): TROPONINI in the last 168 hours. No  results for input(s): TROPIPOC in the last 168 hours.   BNP Recent Labs  Lab 09/05/17 1400  BNP 1,104.8*     DDimer No results for input(s): DDIMER in the last 168 hours.   Radiology    Dg Chest Port 1 View  Result Date: 09/09/2017 CLINICAL DATA:  Followup TAVR. EXAM: PORTABLE CHEST 1 VIEW COMPARISON:  09/05/2017 FINDINGS: Artifact overlies the chest. Power port is in place. Right internal jugular central line tip is in the SVC 3 cm above the right atrium. TAVR device in place. Previous median sternotomy and CABG. Aortic atherosclerosis. Mild interstitial edema. No consolidation or collapse. IMPRESSION: TAVR device in place.  Mild edema. Electronically Signed   By: Nelson Chimes M.D.   On: 09/09/2017 10:37    Cardiac Studies     Patient Profile     77 y.o. male with chronic diastolic CHF, large B cell lymphoma, severe AS, now s/p TAVR on 09/09/17.   Assessment & Plan    1. Severe aortic valve stenosis: One day post TAVR with placement of a 26 mm Edwards sapien valve from the right transfemoral approach. He is doing well. Both groins are stable. Will resume ASA and will start Plavix today. Echo today to assess valve. D/C arterial  line and central line  2. Sinus bradycardia/AV block: he has remained in sinus but still requiring pacemaker with underlying 2:1 AV block. I will leave his temp pacer in place and ask EP team to see today.   3. Acute on chronic diastolic CHF: Mild volume overload post procedure with hypoxia. Will give IV Lasix today.   I will leave him in the ICU today while awaiting decision on need for permanent pacemaker  For questions or updates, please contact [place] Please consult [email or web address] for contact info under Cardiology/STEMI.      Signed, Lauree Chandler, MD  09/10/2017, 7:37 AM

## 2017-09-10 NOTE — Progress Notes (Signed)
  Echocardiogram 2D Echocardiogram has been performed.  Richard [name] L Androw 09/10/2017, 9:13 AM

## 2017-09-11 ENCOUNTER — Other Ambulatory Visit: Payer: Self-pay

## 2017-09-11 ENCOUNTER — Encounter (HOSPITAL_COMMUNITY): Payer: Self-pay | Admitting: Internal Medicine

## 2017-09-11 ENCOUNTER — Inpatient Hospital Stay (HOSPITAL_COMMUNITY): Payer: Medicare Other

## 2017-09-11 DIAGNOSIS — I480 Paroxysmal atrial fibrillation: Secondary | ICD-10-CM

## 2017-09-11 DIAGNOSIS — I350 Nonrheumatic aortic (valve) stenosis: Secondary | ICD-10-CM

## 2017-09-11 DIAGNOSIS — I5033 Acute on chronic diastolic (congestive) heart failure: Secondary | ICD-10-CM

## 2017-09-11 DIAGNOSIS — I442 Atrioventricular block, complete: Secondary | ICD-10-CM

## 2017-09-11 DIAGNOSIS — M7989 Other specified soft tissue disorders: Secondary | ICD-10-CM

## 2017-09-11 DIAGNOSIS — Z952 Presence of prosthetic heart valve: Secondary | ICD-10-CM

## 2017-09-11 LAB — BASIC METABOLIC PANEL
ANION GAP: 9 (ref 5–15)
BUN: 13 mg/dL (ref 6–20)
CALCIUM: 7.7 mg/dL — AB (ref 8.9–10.3)
CHLORIDE: 101 mmol/L (ref 101–111)
CO2: 24 mmol/L (ref 22–32)
CREATININE: 0.86 mg/dL (ref 0.61–1.24)
GFR calc Af Amer: >60 mL/min (ref >60)
GFR calc non Af Amer: >60 mL/min (ref >60)
GLUCOSE: 114 mg/dL — AB (ref 65–99)
Potassium: 3.8 mmol/L (ref 3.5–5.1)
Sodium: 134 mmol/L — ABNORMAL LOW (ref 135–145)

## 2017-09-11 LAB — CBC WITH DIFFERENTIAL/PLATELET
BASOS PCT: 1 %
Basophils Absolute: 0 10*3/uL (ref 0.0–0.1)
EOS PCT: 7 %
Eosinophils Absolute: 0.1 10*3/uL (ref 0.0–0.7)
HEMATOCRIT: 30 % — AB (ref 39.0–52.0)
Hemoglobin: 9.1 g/dL — ABNORMAL LOW (ref 13.0–17.0)
LYMPHS PCT: 22 %
Lymphs Abs: 0.4 10*3/uL — ABNORMAL LOW (ref 0.7–4.0)
MCH: 27.9 pg (ref 26.0–34.0)
MCHC: 30.3 g/dL (ref 30.0–36.0)
MCV: 92 fL (ref 78.0–100.0)
Monocytes Absolute: 0.4 10*3/uL (ref 0.1–1.0)
Monocytes Relative: 24 %
NEUTROS ABS: 0.8 10*3/uL — AB (ref 1.7–7.7)
Neutrophils Relative %: 46 %
Platelets: 86 10*3/uL — ABNORMAL LOW (ref 150–400)
RBC: 3.26 MIL/uL — ABNORMAL LOW (ref 4.22–5.81)
RDW: 18.4 % — ABNORMAL HIGH (ref 11.5–15.5)
WBC: 1.7 10*3/uL — ABNORMAL LOW (ref 4.0–10.5)

## 2017-09-11 LAB — GLUCOSE, CAPILLARY
Glucose-Capillary: 121 mg/dL — ABNORMAL HIGH (ref 65–99)
Glucose-Capillary: 123 mg/dL — ABNORMAL HIGH (ref 65–99)

## 2017-09-11 MED ORDER — FUROSEMIDE 80 MG PO TABS
120.0000 mg | ORAL_TABLET | Freq: Two times a day (BID) | ORAL | Status: DC
  Administered 2017-09-11 – 2017-09-12 (×3): 120 mg via ORAL
  Filled 2017-09-11 (×3): qty 1

## 2017-09-11 MED ORDER — APIXABAN 5 MG PO TABS
5.0000 mg | ORAL_TABLET | Freq: Two times a day (BID) | ORAL | Status: DC
  Administered 2017-09-11 – 2017-09-12 (×3): 5 mg via ORAL
  Filled 2017-09-11 (×3): qty 1

## 2017-09-11 MED FILL — Lidocaine HCl Local Inj 1%: INTRAMUSCULAR | Qty: 60 | Status: AC

## 2017-09-11 NOTE — Progress Notes (Addendum)
Progress Note  Patient Name: Richard [name] Date of Encounter: 09/11/2017  Primary Cardiologist: CMAc  Primary Electrophysiologist: SK    Patient Profile     77 y.o. male admitted for TAVR  Hx of bradycardia and MBZ1 heart block and post TAVR heart block S/p PM  Subjective   Feels great  Inpatient Medications    Scheduled Meds: . acetaminophen  1,000 mg Oral Q6H   Or  . acetaminophen (TYLENOL) oral liquid 160 mg/5 mL  1,000 mg Per Tube Q6H  . allopurinol  450 mg Oral Daily  . amLODipine  5 mg Oral BID  . aspirin EC  81 mg Oral Daily  . atorvastatin  80 mg Oral QHS  . Chlorhexidine Gluconate Cloth  6 each Topical Daily  . clopidogrel  75 mg Oral Q breakfast  . ezetimibe  10 mg Oral Daily  . fluticasone  1 spray Each Nare BID  . furosemide  80 mg Intravenous BID  . glimepiride  2 mg Oral Q breakfast  . guaiFENesin  600 mg Oral BID  . lipase/protease/amylase  72,000 Units Oral TID WC  . mupirocin ointment  1 application Nasal BID  . pantoprazole  40 mg Oral Daily  . potassium chloride SA  40 mEq Oral BID  . pregabalin  75 mg Oral TID  . valACYclovir  1,000 mg Oral Daily   Continuous Infusions: . sodium chloride Stopped (09/10/17 1605)  . sodium chloride Stopped (09/10/17 0700)  .  ceFAZolin (ANCEF) IV Stopped (09/11/17 0009)  . lactated ringers    . norepinephrine (LEVOPHED) Adult infusion Stopped (09/09/17 1501)   PRN Meds: acetaminophen, lactated ringers, metoprolol tartrate, midazolam, morphine injection, ondansetron (ZOFRAN) IV, oxyCODONE, traMADol   Vital Signs    Vitals:   09/11/17 0200 09/11/17 0300 09/11/17 0356 09/11/17 0400  BP: 114/65 117/65  97/65  Pulse: 87 91  86  Resp: 14 15  13   Temp:   97.7 F (36.5 C)   TempSrc:   Oral   SpO2: 95% 95%  94%  Weight:      Height:        Intake/Output Summary (Last 24 hours) at 09/11/2017 3546 Last data filed at 09/11/2017 0400 Gross per 24 hour  Intake 1377.49 ml  Output 5175 ml  Net -3797.51 ml    Filed Weights   09/09/17 0620 09/10/17 0500  Weight: 246 lb 3.2 oz (111.7 kg) 249 lb 9 oz (113.2 kg)    Telemetry  Telemetry Personally reviewed  P-synchronous/ AV  pacing   ECG    P-synchronous/ AV  pacing  - Personally Reviewed  Physical Exam    GEN: No acute distress.   Neck: JVD  flat Cardiac: RRR, 2/6 murmur Pocket without  hematoma, swelling or tenderness  Respiratory: Clear to auscultation bilaterally. GI: Soft, nontender, non-distended  MS LUE swelling no LE edema; No deformity. Neuro:  Nonfocal  Psych: Normal affect  Skin Warm and dry   Labs    Chemistry Recent Labs  Lab 09/05/17 1110  09/09/17 0922 09/09/17 0958 09/10/17 0340 09/11/17 0234  NA 142   < > 137 138 136 134*  K 3.6   < > 3.8 3.8 3.9 3.8  CL 103   < > 98*  --  104 101  CO2 31  --   --   --  23 24  GLUCOSE 103   < > 209* 177* 122* 114*  BUN 17   < > 12  --  12  13  CREATININE 1.00   < > 0.80  --  0.91 0.86  CALCIUM 9.5  --   --   --  8.0* 7.7*  PROT 6.4  --   --   --   --   --   ALBUMIN 3.5  --   --   --   --   --   AST 24  --   --   --   --   --   ALT 19  --   --   --   --   --   ALKPHOS 106*  --   --   --   --   --   BILITOT 1.0  --   --   --   --   --   GFRNONAA  --   --   --   --  >60 >60  GFRAA  --   --   --   --  >60 >60  ANIONGAP 8  --   --   --  9 9   < > = values in this interval not displayed.     Hematology Recent Labs  Lab 09/09/17 0953 09/09/17 0958 09/10/17 0340 09/11/17 0234  WBC 3.2*  --  2.2* 1.7*  RBC 3.20*  --  3.05* 3.26*  HGB 8.6* 9.2* 8.6* 9.1*  HCT 29.4* 27.0* 28.3* 30.0*  MCV 91.9  --  92.8 92.0  MCH 26.9  --  28.2 27.9  MCHC 29.3*  --  30.4 30.3  RDW 18.4*  --  18.6* 18.4*  PLT 91*  --  82* 86*    Cardiac EnzymesNo results for input(s): TROPONINI in the last 168 hours. No results for input(s): TROPIPOC in the last 168 hours.   BNP Recent Labs  Lab 09/05/17 1400  BNP 1,104.8*     DDimer No results for input(s): DDIMER in the last 168  hours.   Radiology    Dg Chest Port 1 View  Result Date: 09/09/2017 CLINICAL DATA:  Followup TAVR. EXAM: PORTABLE CHEST 1 VIEW COMPARISON:  09/05/2017 FINDINGS: Artifact overlies the chest. Power port is in place. Right internal jugular central line tip is in the SVC 3 cm above the right atrium. TAVR device in place. Previous median sternotomy and CABG. Aortic atherosclerosis. Mild interstitial edema. No consolidation or collapse. IMPRESSION: TAVR device in place.  Mild edema. Electronically Signed   By: Nelson Chimes M.D.   On: 09/09/2017 10:37    Chest Xray personally reviewed  Lead postitionstable  Cardiac Studies     Device Interrogation    P   Assessment & Plan     Aortic stenosis status post TAVR   Atrial Fibrillation paroxysmal   Intemittent complete heart block  Pacemaker [employer]onic parahisian  CAD s/p CABG  OSA on CPAP  LUE swelling    Bradycardia and MBZ1 2AVB  LUE swollen, was swollen prior to procedure yday,  Keep arm elevated   Device function normal  Interrogation pending   Will need anticoagulation for atrial fib-- per CMac ASA plus apixoban  OK with me for discharge   Signed, [name], MD  09/11/2017, 6:23 AM

## 2017-09-11 NOTE — Progress Notes (Signed)
Report called to [facility]. Pt transferred now.  Lucius Conn, RN

## 2017-09-11 NOTE — Progress Notes (Signed)
Progress Note  Patient Name: Richard [name] Date of Encounter: 09/11/2017  Primary Cardiologist: Minus Breeding, MD   Subjective   No complaints this am. No chest pain or SOB. Left arm swelling. Noted yesterday before pacemaker was implanted.   Inpatient Medications    Scheduled Meds: . acetaminophen  1,000 mg Oral Q6H   Or  . acetaminophen (TYLENOL) oral liquid 160 mg/5 mL  1,000 mg Per Tube Q6H  . allopurinol  450 mg Oral Daily  . amLODipine  5 mg Oral BID  . aspirin EC  81 mg Oral Daily  . atorvastatin  80 mg Oral QHS  . Chlorhexidine Gluconate Cloth  6 each Topical Daily  . clopidogrel  75 mg Oral Q breakfast  . ezetimibe  10 mg Oral Daily  . fluticasone  1 spray Each Nare BID  . furosemide  80 mg Intravenous BID  . glimepiride  2 mg Oral Q breakfast  . guaiFENesin  600 mg Oral BID  . lipase/protease/amylase  72,000 Units Oral TID WC  . mupirocin ointment  1 application Nasal BID  . pantoprazole  40 mg Oral Daily  . potassium chloride SA  40 mEq Oral BID  . pregabalin  75 mg Oral TID  . valACYclovir  1,000 mg Oral Daily   Continuous Infusions: . sodium chloride Stopped (09/10/17 1605)  . sodium chloride Stopped (09/10/17 0700)  .  ceFAZolin (ANCEF) IV 1 g (09/11/17 0645)  . lactated ringers    . norepinephrine (LEVOPHED) Adult infusion Stopped (09/09/17 1501)   PRN Meds: acetaminophen, lactated ringers, metoprolol tartrate, midazolam, morphine injection, ondansetron (ZOFRAN) IV, oxyCODONE, traMADol   Vital Signs    Vitals:   09/11/17 0300 09/11/17 0356 09/11/17 0400 09/11/17 0600  BP: 117/65  97/65   Pulse: 91  86   Resp: 15  13   Temp:  97.7 F (36.5 C)    TempSrc:  Oral    SpO2: 95%  94%   Weight:    250 lb 3.6 oz (113.5 kg)  Height:        Intake/Output Summary (Last 24 hours) at 09/11/2017 0714 Last data filed at 09/11/2017 0400 Gross per 24 hour  Intake 1367.49 ml  Output 5175 ml  Net -3807.51 ml   Filed Weights   09/09/17 0620 09/10/17  0500 09/11/17 0600  Weight: 246 lb 3.2 oz (111.7 kg) 249 lb 9 oz (113.2 kg) 250 lb 3.6 oz (113.5 kg)    Telemetry    Paced- Personally Reviewed  ECG    Sinus, v pacing- Personally Reviewed  Physical Exam   GEN: No acute distress.   Neck: No JVD Cardiac: RRR, soft systolic murmur.   Respiratory: Clear to auscultation bilaterally. GI: Soft, nontender, non-distended  MS: No LE edema. Bilateral groins without hematoma. Left arm has 1+ edema from hand to shoulder. Right arm is not swollen.  Neuro:  Nonfocal  Psych: Normal affect   Labs    Chemistry Recent Labs  Lab 09/05/17 1110  09/09/17 0922 09/09/17 0958 09/10/17 0340 09/11/17 0234  NA 142   < > 137 138 136 134*  K 3.6   < > 3.8 3.8 3.9 3.8  CL 103   < > 98*  --  104 101  CO2 31  --   --   --  23 24  GLUCOSE 103   < > 209* 177* 122* 114*  BUN 17   < > 12  --  12 13  CREATININE 1.00   < >  0.80  --  0.91 0.86  CALCIUM 9.5  --   --   --  8.0* 7.7*  PROT 6.4  --   --   --   --   --   ALBUMIN 3.5  --   --   --   --   --   AST 24  --   --   --   --   --   ALT 19  --   --   --   --   --   ALKPHOS 106*  --   --   --   --   --   BILITOT 1.0  --   --   --   --   --   GFRNONAA  --   --   --   --  >60 >60  GFRAA  --   --   --   --  >60 >60  ANIONGAP 8  --   --   --  9 9   < > = values in this interval not displayed.     Hematology Recent Labs  Lab 09/09/17 0953 09/09/17 0958 09/10/17 0340 09/11/17 0234  WBC 3.2*  --  2.2* 1.7*  RBC 3.20*  --  3.05* 3.26*  HGB 8.6* 9.2* 8.6* 9.1*  HCT 29.4* 27.0* 28.3* 30.0*  MCV 91.9  --  92.8 92.0  MCH 26.9  --  28.2 27.9  MCHC 29.3*  --  30.4 30.3  RDW 18.4*  --  18.6* 18.4*  PLT 91*  --  82* 86*    Cardiac EnzymesNo results for input(s): TROPONINI in the last 168 hours. No results for input(s): TROPIPOC in the last 168 hours.   BNP Recent Labs  Lab 09/05/17 1400  BNP 1,104.8*     DDimer No results for input(s): DDIMER in the last 168 hours.   Radiology    Dg  Chest Port 1 View  Result Date: 09/09/2017 CLINICAL DATA:  Followup TAVR. EXAM: PORTABLE CHEST 1 VIEW COMPARISON:  09/05/2017 FINDINGS: Artifact overlies the chest. Power port is in place. Right internal jugular central line tip is in the SVC 3 cm above the right atrium. TAVR device in place. Previous median sternotomy and CABG. Aortic atherosclerosis. Mild interstitial edema. No consolidation or collapse. IMPRESSION: TAVR device in place.  Mild edema. Electronically Signed   By: Nelson Chimes M.D.   On: 09/09/2017 10:37    Cardiac Studies   Echo 09/10/17: - Left ventricle: The cavity size was normal. Wall thickness was   increased in a pattern of moderate LVH. Systolic function was   normal. The estimated ejection fraction was in the range of 55%   to 60%. Incoordinate septal motion. Doppler parameters are   consistent with diastolic dysfunction and elevated LV filling   pressure. - Ventricular septum: The contour showed diastolic flattening and   systolic flattening. - Aortic valve: s/p Edwards Sapien 3 TAVR. Trivial perivalvular   leak noted. No obstruction. Mean gradient (S): 14 mm Hg. Peak   gradient (S): 27 mm Hg. Valve area (VTI): 1.76 cm^2. Valve area   (Vmax): 2.01 cm^2. Valve area (Vmean): 2.39 cm^2. - Mitral valve: Calcified annulus. Mildly thickened leaflets .   There was moderate regurgitation. - Left atrium: The atrium was mildly dilated. - Right atrium: The atrium was mildly dilated. - Tricuspid valve: There was moderate regurgitation. - Pulmonary arteries: PA peak pressure: 53 mm Hg (S). - Line: A venous catheter or wire was  visualized in the inferior   vena cava, with its tip in the right atrium. No abnormal features   noted. - Inferior vena cava: The vessel was dilated. The respirophasic   diameter changes were blunted (< 50%), consistent with elevated   central venous pressure. - Pericardium, extracardiac: There was no pericardial effusion.  Patient Profile         77 y.o. male with chronic diastolic CHF, large B cell lymphoma, severe AS, now s/p TAVR on 09/09/17. Complete heart block post procedure. Now s/p permanent pacemaker placement 09/10/17.  Assessment & Plan    1. Severe aortic valve stenosis: Two days post TAVR with placement of a 26 mm Edwards sapien valve from the right transfemoral approach. He is stable today. Both groins are without hematoma. Will plan to continue ASA. Will d/c Plavix. Will start Eliquis today given atrial fibrillation. Echo 09/10/17 with normal LV systolic function, trivial perivalvular leak. Mean gradient 14 mmHg.   2. Intermittent complete heart block/atrial fibrillation: He is in sinus this am. S/p permanent pacemaker placement 09/10/17. Will start Eliquis today.    3. Acute on chronic diastolic CHF: Volume status is better today. He is negative 4.6 liters since admission. Will change back to po Lasix today at home dose.   4. Left arm edema: Will arrange venous dopplers to exclude DVT. He will be started on Eliquis today anyway for atrial fibrillation  Transfer to telemetry today. Ambulate. Probable d/c home tomorrow.   For questions or updates, please contact [place] Please consult [email or web address] for contact info under Cardiology/STEMI.      Signed, Lauree Chandler, MD  09/11/2017, 7:14 AM

## 2017-09-11 NOTE — Progress Notes (Signed)
Nutrition Brief Note  Patient identified on the Malnutrition Screening Tool (MST) Report  Wt Readings from Last 15 Encounters:  09/11/17 250 lb 3.6 oz (113.5 kg)  09/05/17 246 lb 3.2 oz (111.7 kg)  09/05/17 248 lb (112.5 kg)  09/04/17 254 lb (115.2 kg)  08/25/17 256 lb 6.4 oz (116.3 kg)  08/17/17 245 lb 2.4 oz (111.2 kg)  08/12/17 252 lb (114.3 kg)  08/05/17 251 lb (113.9 kg)  08/01/17 252 lb 6.4 oz (114.5 kg)  07/29/17 245 lb 12.8 oz (111.5 kg)  07/18/17 251 lb 12 oz (114.2 kg)  06/27/17 257 lb (116.6 kg)  06/06/17 252 lb 6 oz (114.5 kg)  06/05/17 255 lb (115.7 kg)  05/30/17 252 lb (114.3 kg)   Per weight encounters, no significant weight gain or weight loss recently  Body mass index is 33.94 kg/m. Patient meets criteria for obesity unspecified based on current BMI.   Current diet order is HeartHealthy/Carb Modified, patient is consuming approximately 75-100% of meals at this time. Labs and medications reviewed.   No nutrition interventions warranted at this time. If nutrition issues arise, please consult RD.   Kerman Passey MS, RD, [place], [place] [phone] Pager  [phone] Weekend/On-Call Pager

## 2017-09-11 NOTE — Progress Notes (Signed)
Pt requested water to be added to his home CPAP. Pt has home nasal prongs. RT added sterile water and bled 3L 02 as pt requested.

## 2017-09-11 NOTE — Progress Notes (Signed)
VASCULAR LAB PRELIMINARY  PRELIMINARY  PRELIMINARY  PRELIMINARY  Left upper extremity venous duplex completed.    Preliminary report:  There is no obvious evidence of DVT or SVT noted in the visualized veins of the left upper extremity.  Cephalic vein is thickened but not thrombosed.  Yamen Castrogiovanni, RVT 09/11/2017, 9:04 AM

## 2017-09-11 NOTE — Progress Notes (Signed)
CARDIAC REHAB PHASE I   PRE:  Rate/Rhythm: 104 a pacing    BP: sitting 131/80    SaO2: 96 RA  MODE:  Ambulation: 290 ft   POST:  Rate/Rhythm: 119 pacing    BP: sitting 121/73     SaO2: 98 RA  Pt up independently in room. Slow, steady pace in hall, stand by assist. Pt sts his legs are weak and his hips ache (PTA problem). HR up to 119 pacing. Pt sts this is the farthest he has walked in 7 months. Discussed pacer restrictions, groin restrictions, walking at home, and CRPII. Will refer to [place]. Pt is eager to get his body more conditioned and get back to golf. [id number]   [place], ACSM 09/11/2017 11:41 AM

## 2017-09-12 DIAGNOSIS — D72819 Decreased white blood cell count, unspecified: Secondary | ICD-10-CM

## 2017-09-12 DIAGNOSIS — Z950 Presence of cardiac pacemaker: Secondary | ICD-10-CM

## 2017-09-12 DIAGNOSIS — D649 Anemia, unspecified: Secondary | ICD-10-CM

## 2017-09-12 LAB — CBC WITH DIFFERENTIAL/PLATELET
BASOS PCT: 1 %
Band Neutrophils: 11 %
Basophils Absolute: 0 10*3/uL (ref 0.0–0.1)
Blasts: 0 %
Eosinophils Absolute: 0.1 10*3/uL (ref 0.0–0.7)
Eosinophils Relative: 8 %
HEMATOCRIT: 31.1 % — AB (ref 39.0–52.0)
HEMOGLOBIN: 9.2 g/dL — AB (ref 13.0–17.0)
LYMPHS PCT: 39 %
Lymphs Abs: 0.6 10*3/uL — ABNORMAL LOW (ref 0.7–4.0)
MCH: 26.8 pg (ref 26.0–34.0)
MCHC: 29.6 g/dL — ABNORMAL LOW (ref 30.0–36.0)
MCV: 90.7 fL (ref 78.0–100.0)
MONO ABS: 0.2 10*3/uL (ref 0.1–1.0)
Metamyelocytes Relative: 2 %
Monocytes Relative: 15 %
Myelocytes: 0 %
NEUTROS PCT: 24 %
Neutro Abs: 0.6 10*3/uL — ABNORMAL LOW (ref 1.7–7.7)
OTHER: 0 %
PLATELETS: 88 10*3/uL — AB (ref 150–400)
PROMYELOCYTES ABS: 0 %
RBC: 3.43 MIL/uL — AB (ref 4.22–5.81)
RDW: 18.3 % — ABNORMAL HIGH (ref 11.5–15.5)
WBC: 1.5 10*3/uL — AB (ref 4.0–10.5)
nRBC: 0 /100 WBC

## 2017-09-12 LAB — BASIC METABOLIC PANEL
Anion gap: 10 (ref 5–15)
BUN: 11 mg/dL (ref 6–20)
CHLORIDE: 104 mmol/L (ref 101–111)
CO2: 24 mmol/L (ref 22–32)
Calcium: 8.1 mg/dL — ABNORMAL LOW (ref 8.9–10.3)
Creatinine, Ser: 0.87 mg/dL (ref 0.61–1.24)
GFR calc Af Amer: >60 mL/min (ref >60)
GFR calc non Af Amer: >60 mL/min (ref >60)
GLUCOSE: 108 mg/dL — AB (ref 65–99)
POTASSIUM: 3.7 mmol/L (ref 3.5–5.1)
Sodium: 138 mmol/L (ref 135–145)

## 2017-09-12 MED ORDER — TBO-FILGRASTIM 480 MCG/0.8ML ~~LOC~~ SOSY
480.0000 ug | PREFILLED_SYRINGE | Freq: Once | SUBCUTANEOUS | Status: AC
  Administered 2017-09-12: 480 ug via SUBCUTANEOUS
  Filled 2017-09-12: qty 0.8

## 2017-09-12 MED ORDER — APIXABAN 5 MG PO TABS: 5.0000 mg | ORAL_TABLET | Freq: Two times a day (BID) | ORAL | 1 refills | Status: DC

## 2017-09-12 MED FILL — Sodium Chloride IV Soln 0.9%: INTRAVENOUS | Qty: 250 | Status: AC

## 2017-09-12 MED FILL — Potassium Chloride Inj 2 mEq/ML: INTRAVENOUS | Qty: 40 | Status: AC

## 2017-09-12 MED FILL — Phenylephrine HCl Inj 10 MG/ML: INTRAMUSCULAR | Qty: 2 | Status: AC

## 2017-09-12 MED FILL — Dexmedetomidine HCl in NaCl 0.9% IV Soln 400 MCG/100ML: INTRAVENOUS | Qty: 100 | Status: AC

## 2017-09-12 MED FILL — Heparin Sodium (Porcine) Inj 1000 Unit/ML: INTRAMUSCULAR | Qty: 30 | Status: AC

## 2017-09-12 MED FILL — Magnesium Sulfate Inj 50%: INTRAMUSCULAR | Qty: 10 | Status: AC

## 2017-09-12 NOTE — Discharge Instructions (Signed)
° ° °  Supplemental Discharge Instructions for  Pacemaker/Defibrillator Patients  Activity No heavy lifting or vigorous activity with your left/right arm for 6 to 8 weeks.  Do not raise your left/right arm above your head for one week.  Gradually raise your affected arm as drawn below.           __  NO DRIVING until your follow up appt.  WOUND CARE - Keep the wound area clean and dry.  Do not get this area wet for one week. No showers for one week; you may shower on     . - The tape/steri-strips on your wound will fall off; do not pull them off.  No bandage is needed on the site.  DO  NOT apply any creams, oils, or ointments to the wound area. - If you notice any drainage or discharge from the wound, any swelling or bruising at the site, or you develop a fever > 101? F after you are discharged home, call the office at once.  Special Instructions - You are still able to use cellular telephones; use the ear opposite the side where you have your pacemaker/defibrillator.  Avoid carrying your cellular phone near your device. - When traveling through airports, show security personnel your identification card to avoid being screened in the metal detectors.  Ask the security personnel to use the hand wand. - Avoid arc welding equipment, MRI testing (magnetic resonance imaging), TENS units (transcutaneous nerve stimulators).  Call the office for questions about other devices. - Avoid electrical appliances that are in poor condition or are not properly grounded. - Microwave ovens are safe to be near or to operate.  Additional information for defibrillator patients should your device go off: - If your device goes off ONCE and you feel fine afterward, notify the device clinic nurses. - If your device goes off ONCE and you do not feel well afterward, call 911. - If your device goes off TWICE, call 911. - If your device goes off THREE times in one day, call 911.  DO NOT DRIVE YOURSELF OR A FAMILY  MEMBER WITH A DEFIBRILLATOR TO THE HOSPITAL--CALL 911.

## 2017-09-12 NOTE — Progress Notes (Signed)
Mr. Mostafa is doing okay.  He says he will go home today.  He was moved out of the cardiac ICU a couple days ago.  He had a pacemaker placed.  From the monitor, looks like he is in a sinus rhythm.  His lab work today does show some leukopenia.  I will give him a dose of Neupogen.  He is having a slower recovery from his chemotherapy than most patients.  I am sure this probably is reflective of his age.  His platelet count is 88,000.  I do not see any problems with this and him being on oral anticoagulation.  He is walking.  He feels so much better now.  He is not as short of breath.  The aortic valve definitely is making life better for him.  He is slightly anemic.  He had iron studies done 2 days ago.  His saturation was 23%.  He did get some IV iron back on March 11.  He is eating well.  He has had no nausea or vomiting.  Again, we will give him a dose of Neupogen for his leukopenia.  He already has an appointment to see [place] in a couple weeks or so.  This is for his vitamin B12 injection.  Lattie Haw, MD  [place] [phone]

## 2017-09-12 NOTE — Progress Notes (Signed)
Pt discharged per MD order, all discharge instructions reviewed and all question answered.

## 2017-09-12 NOTE — Progress Notes (Signed)
Progress Note  Patient Name: Richard [name] Date of Encounter: 09/12/2017  Primary Cardiologist: Minus Breeding, MD   Subjective   He feels great. No complaints. No chest pain or dyspnea.    Inpatient Medications    Scheduled Meds: . acetaminophen  1,000 mg Oral Q6H   Or  . acetaminophen (TYLENOL) oral liquid 160 mg/5 mL  1,000 mg Per Tube Q6H  . allopurinol  450 mg Oral Daily  . amLODipine  5 mg Oral BID  . apixaban  5 mg Oral BID  . aspirin EC  81 mg Oral Daily  . atorvastatin  80 mg Oral QHS  . Chlorhexidine Gluconate Cloth  6 each Topical Daily  . ezetimibe  10 mg Oral Daily  . fluticasone  1 spray Each Nare BID  . furosemide  120 mg Oral BID  . glimepiride  2 mg Oral Q breakfast  . guaiFENesin  600 mg Oral BID  . lipase/protease/amylase  72,000 Units Oral TID WC  . pantoprazole  40 mg Oral Daily  . potassium chloride SA  40 mEq Oral BID  . pregabalin  75 mg Oral TID  . valACYclovir  1,000 mg Oral Daily   Continuous Infusions: . sodium chloride Stopped (09/10/17 1605)  . sodium chloride Stopped (09/10/17 0700)  . lactated ringers    . norepinephrine (LEVOPHED) Adult infusion Stopped (09/09/17 1501)   PRN Meds: acetaminophen, lactated ringers, metoprolol tartrate, midazolam, morphine injection, ondansetron (ZOFRAN) IV, oxyCODONE, traMADol   Vital Signs    Vitals:   09/11/17 1929 09/11/17 2330 09/12/17 0519 09/12/17 0736  BP: 121/73 127/77 116/64 116/66  Pulse: (!) 102 100 98 (!) 103  Resp: 20 16 17 18   Temp: 97.7 F (36.5 C) (!) 97.5 F (36.4 C) 97.8 F (36.6 C) 97.6 F (36.4 C)  TempSrc: Oral Oral Oral Oral  SpO2: 94% 96% 96% 94%  Weight:   240 lb 8.4 oz (109.1 kg)   Height:        Intake/Output Summary (Last 24 hours) at 09/12/2017 0924 Last data filed at 09/12/2017 0735 Gross per 24 hour  Intake 1080 ml  Output 4800 ml  Net -3720 ml   Filed Weights   09/10/17 0500 09/11/17 0600 09/12/17 0519  Weight: 249 lb 9 oz (113.2 kg) 250 lb 3.6 oz  (113.5 kg) 240 lb 8.4 oz (109.1 kg)    Telemetry    V paced- Personally Reviewed  ECG    No am EKG- Personally Reviewed  Physical Exam   General: Well developed, well nourished, NAD  HEENT: OP clear, mucus membranes moist  SKIN: warm, dry. No rashes. Neuro: No focal deficits  Musculoskeletal: Muscle strength 5/5 all ext  Psychiatric: Mood and affect normal  Neck: No JVD, no carotid bruits, no thyromegaly, no lymphadenopathy.  Lungs:Clear bilaterally, no wheezes, rhonci, crackles Cardiovascular: Regular rate and rhythm. Soft systolic murmur.  Abdomen:Soft. Bowel sounds present. Non-tender.  Extremities: 1+ bilateral LE edema. 1+ left arm edema to elbow-improved    Labs    Chemistry Recent Labs  Lab 09/05/17 1110  09/10/17 0340 09/11/17 0234 09/12/17 0249  NA 142   < > 136 134* 138  K 3.6   < > 3.9 3.8 3.7  CL 103   < > 104 101 104  CO2 31  --  23 24 24   GLUCOSE 103   < > 122* 114* 108*  BUN 17   < > 12 13 11   CREATININE 1.00   < > 0.91  0.86 0.87  CALCIUM 9.5  --  8.0* 7.7* 8.1*  PROT 6.4  --   --   --   --   ALBUMIN 3.5  --   --   --   --   AST 24  --   --   --   --   ALT 19  --   --   --   --   ALKPHOS 106*  --   --   --   --   BILITOT 1.0  --   --   --   --   GFRNONAA  --   --  >60 >60 >60  GFRAA  --   --  >60 >60 >60  ANIONGAP 8  --  9 9 10    < > = values in this interval not displayed.     Hematology Recent Labs  Lab 09/10/17 0340 09/11/17 0234 09/12/17 0249  WBC 2.2* 1.7* 1.5*  RBC 3.05* 3.26* 3.43*  HGB 8.6* 9.1* 9.2*  HCT 28.3* 30.0* 31.1*  MCV 92.8 92.0 90.7  MCH 28.2 27.9 26.8  MCHC 30.4 30.3 29.6*  RDW 18.6* 18.4* 18.3*  PLT 82* 86* 88*    Cardiac EnzymesNo results for input(s): TROPONINI in the last 168 hours. No results for input(s): TROPIPOC in the last 168 hours.   BNP Recent Labs  Lab 09/05/17 1400  BNP 1,104.8*     DDimer No results for input(s): DDIMER in the last 168 hours.   Radiology    Dg Chest 2 View  Result  Date: 09/11/2017 CLINICAL DATA:  High-grade heart block persisting 24 hours post TAVR. EXAM: CHEST - 2 VIEW COMPARISON:  09/09/2017. FINDINGS: Cardiomegaly.  No infiltrates or edema.  TAVR device. Pacemaker device has been placed via LEFT subclavian approach. One lead appears coiled in the RIGHT atrium. Another lead appears to cross the midline into the RIGHT ventricle. There is no pneumothorax. IMPRESSION: Pacer placement as described.  Cardiomegaly. Electronically Signed   By: Staci Righter M.D.   On: 09/11/2017 09:39    Cardiac Studies   Echo 09/10/17: - Left ventricle: The cavity size was normal. Wall thickness was   increased in a pattern of moderate LVH. Systolic function was   normal. The estimated ejection fraction was in the range of 55%   to 60%. Incoordinate septal motion. Doppler parameters are   consistent with diastolic dysfunction and elevated LV filling   pressure. - Ventricular septum: The contour showed diastolic flattening and   systolic flattening. - Aortic valve: s/p Edwards Sapien 3 TAVR. Trivial perivalvular   leak noted. No obstruction. Mean gradient (S): 14 mm Hg. Peak   gradient (S): 27 mm Hg. Valve area (VTI): 1.76 cm^2. Valve area   (Vmax): 2.01 cm^2. Valve area (Vmean): 2.39 cm^2. - Mitral valve: Calcified annulus. Mildly thickened leaflets .   There was moderate regurgitation. - Left atrium: The atrium was mildly dilated. - Right atrium: The atrium was mildly dilated. - Tricuspid valve: There was moderate regurgitation. - Pulmonary arteries: PA peak pressure: 53 mm Hg (S). - Line: A venous catheter or wire was visualized in the inferior   vena cava, with its tip in the right atrium. No abnormal features   noted. - Inferior vena cava: The vessel was dilated. The respirophasic   diameter changes were blunted (< 50%), consistent with elevated   central venous pressure. - Pericardium, extracardiac: There was no pericardial effusion.  Patient Profile  77 y.o. male with chronic diastolic CHF, large B cell lymphoma, severe AS, now s/p TAVR on 09/09/17. Complete heart block post procedure. Now s/p permanent pacemaker placement 09/10/17.  Assessment & Plan    1. Severe aortic valve stenosis: Three days post TAVR with placement of a 26 mm Edwards sapien valve from the right transfemoral approach. He is doing well. No dyspnea or chest pain. Groins stable. Will continue ASA and Eliquis. (He would like [facility] [employer]). Echo 09/10/17 with normal LV systolic function, trivial perivalvular leak. Mean gradient 14 mmHg.   2. Intermittent complete heart block/atrial fibrillation: S/p permanent pacemaker placement 09/10/17. Will continue Eliquis. This is new for him. I will ask the EP team to review the post pacemaker protocol with him. (he has questions about his bandage, use of his arm etc).   3. Acute on chronic diastolic CHF: Volume status has improved. Negative 8.3 liters since admission. He is back on home dosage of lasix.    4. Left arm edema: He does not recall this prior to his pacemaker implant. Venous dopplers negative for DVT. He has been started on Eliquis for his atrial fib. He will elevate arm at home. No further workup at this time.   Discharge home today. TAVR follow up has been arranged. Will ask EP team to assist with EP follow up. New med is Eliquis.   For questions or updates, please contact [facility] Please consult [email or web address] for contact info under Cardiology/STEMI.      Signed, Lauree Chandler, MD  09/12/2017, 9:24 AM

## 2017-09-12 NOTE — Care Management Note (Addendum)
Case Management Note  Patient Details  Name: Richard [name] MRN: [id number] Date of Birth: 1940/11/27  Subjective/Objective:    Pt is s/p pacemaker implantation  Action/Plan:  PTA independent from home alone - pt plans to discharge to daughters home in [place] and return home at a later time.  Pt will discharge home on Eliquis - CM provided free 30day card.  CM contacted [facility] and verified pt can get prescription filled today.  CM informed pt of copay.  Pt has PCP.    Expected Discharge Date:  09/12/17               Expected Discharge Plan:  Home/Self Care  In-House Referral:     Discharge planning Services  CM Consult  Post Acute Care Choice:    Choice offered to:     DME Arranged:    DME Agency:     HH Arranged:    HH Agency:     Status of Service:  Completed, signed off  If discussed at [employer] of Stay Meetings, dates discussed:    Additional Comments: Eliquis benefit check Co-pay for Eliquis 30 day supply twice a day : 2.5 mg and or 5 mg $185.86.  No PA required  [facility] are: [facility].  Also Pt. Has not met his deductible.   Marcene Duos, RN 09/12/2017, 1:53 PM

## 2017-09-12 NOTE — Discharge Summary (Signed)
Discharge Summary    Patient ID: Richard [name],  MRN: [id number], DOB/AGE: 07/17/1940 77 y.o.  Admit date: 09/09/2017 Discharge date: 09/12/2017  Primary Care Provider: Roma Schanz R Primary Cardiologist: Dr. Laverda Page)  Discharge Diagnoses    Active Problems:   Status post transcatheter aortic valve replacement (TAVR) using bioprosthesis   Severe aortic stenosis   Complete heart block (HCC)   Paroxysmal atrial fibrillation (HCC)   Acute on chronic diastolic CHF (congestive heart failure), NYHA class 2 (HCC)   Left arm swelling   Allergies Allergies  Allergen Reactions  . Benazepril Swelling    angioedema; he is not a candidate for any angiotensin receptor blockers because of this significant allergic reaction. Because of a history of documented adverse serious drug reaction;Medi Alert bracelet  is recommended  . Hctz [Hydrochlorothiazide] Anaphylaxis and Swelling    Tongue and lip swelling   . Aspirin Other (See Comments)    Gastritis, cant take 325 Mg aspirin   . Lactose Intolerance (Gi) Nausea And Vomiting    Diagnostic Studies/Procedures    TAVR: 09/09/17  Procedure:        Transcatheter Aortic Valve Replacement - Percutaneous Right Transfemoral Approach             Edwards Sapien 3 THV (size 26 mm, model # 9600TFX, serial # [id number])              Co-Surgeons: Gaye Pollack, MD and Lauree Chandler, MD  Anesthesiologist: Perfecto Kingdom, MD  Echocardiographer: Ena Dawley, MD  Pre-operative Echo Findings: ? Severe aortic stenosis ? Normal left ventricular systolic function  Post-operative Echo Findings: ? No paravalvular leak ? Normal left ventricular systolic function _____________   History of Present Illness     77 year old gentleman with a history of hypertension, hyperlipidemia, type 2 diabetes, obstructive sleep apnea on CPAP, coronary artery disease status post CABG x5 in 2001 by Dr. Cyndia Bent, and diffuse large  B-cell lymphoma currently undergoing chemotherapy. He has a history of aortic stenosis diagnosed in 2015 when he was noted to have a heart murmur on exam. An echocardiogram at that time showed a mean gradient of 14 mmHg with a peak gradient of 31 mmHg. The dimensionless index was 0.48. Left ventricular ejection fraction was 55-65%. He had a follow-up echocardiogram in July 2018 which showed an increase in the mean gradient to 40 mmHg with an aortic valve area of 0.78 cm. He reportedly had a heart rate in the 30's intermittently but had no dizziness or syncope. A Holter monitor showed occasional transient 2-1 heart block. Then in August 2018 he was diagnosed with diffuse large B-cell lymphoma after a biopsy of a left axillary lymph node was performed. His presenting complaint was some neck pain. He had a Port-A-Cath placed on 03/06/2017 and was started on chemotherapy with Adriamycin, vincristine, cytoxan, and rituxan under the direction of Dr. Marin Olp. His last course of chemo was on 07/18/2017 with plans for his last session on 08/08/2017. He recently presented with a several month history of progressively worsening exertional fatigue and shortness of breath. This had progressed to the point where he was getting shortness of breath with walking in his house. This was associated with some chest tightness. He has not had any dizziness or syncope. He began having symptoms at rest while watching TV and came to the [facility] emergency department. His troponin was 0.09. The curve stayed flat at 0.10. He improved with diuresis. Cardiac catheterization was performed and  showed severe native three-vessel coronary disease with a patent right internal mammary artery graft to the posterior descending artery, patent left internal mammary artery graft to the LAD, and a patent sequential saphenous vein graft to intermediate and obtuse marginal branch. 1 of his vein grafts to a small diagonal branch was occluded. He is a retired  Restaurant manager, fast food and previously worked for [employer] until retiring about 11 years ago. He says that he was playing golf 7 days/week until recently. He is divorced and lives alone. He has 2 children who live close by. He was referred to the structural heart clinic and considered an appropriate candidate for TAVR.   Hospital Course     Consultants: EP, Oncology   He underwent successful TAVR with Edwards Sapien 3 THV size 79mm. His temp pacing wire was left in place as he required continued pacing. He remained in 2:1 AVB. EP was asked to see in consult. Seen by Dr. Caryl Comes who felt appropriate candidate for PPM placement. He developed PAF and decision was made to placed on DOAC (Eliquis) and ASA. Dr. Marin Olp was consulted in regards to his non-Hodgkin's Lymphoma and this pancytopenia. It was felt there was no contraindications to [facility] Had PPM placed on 12/14/05 without complications. Given IV lasix for mild volume overload with improvement. Follow up echo on 09/10/17 with normal LV function, trivial perivalvular leak and mean gradient 63mmHg. He was net negative 8.3 at the time of discharge. Did develop left arm edema but venous dopplers were negative for DVT. Instructed to elevate at home.   Richard [name] was seen by Dr. Angelena Form and determined stable for discharge home. Follow up in the office has been arranged. Medications are listed below.   _____________  Discharge Vitals Blood pressure 125/72, pulse (!) 103, temperature 97.8 F (36.6 C), temperature source Oral, resp. rate (!) 23, height 6' (1.829 m), weight 240 lb 8.4 oz (109.1 kg), SpO2 100 %.  Filed Weights   09/10/17 0500 09/11/17 0600 09/12/17 0519  Weight: 249 lb 9 oz (113.2 kg) 250 lb 3.6 oz (113.5 kg) 240 lb 8.4 oz (109.1 kg)    Labs & Radiologic Studies    CBC Recent Labs    09/11/17 0234 09/12/17 0249  WBC 1.7* 1.5*  NEUTROABS 0.8* 0.6*  HGB 9.1* 9.2*  HCT 30.0* 31.1*  MCV 92.0 90.7  PLT 86* 88*   Basic Metabolic  Panel Recent Labs    09/10/17 0340 09/11/17 0234 09/12/17 0249  NA 136 134* 138  K 3.9 3.8 3.7  CL 104 101 104  CO2 23 24 24   GLUCOSE 122* 114* 108*  BUN 12 13 11   CREATININE 0.91 0.86 0.87  CALCIUM 8.0* 7.7* 8.1*  MG 1.8  --   --    Liver Function Tests No results for input(s): AST, ALT, ALKPHOS, BILITOT, PROT, ALBUMIN in the last 72 hours. No results for input(s): LIPASE, AMYLASE in the last 72 hours. Cardiac Enzymes No results for input(s): CKTOTAL, CKMB, CKMBINDEX, TROPONINI in the last 72 hours. BNP Invalid input(s): POCBNP D-Dimer No results for input(s): DDIMER in the last 72 hours. Hemoglobin A1C No results for input(s): HGBA1C in the last 72 hours. Fasting Lipid Panel No results for input(s): CHOL, HDL, LDLCALC, TRIG, CHOLHDL, LDLDIRECT in the last 72 hours. Thyroid Function Tests No results for input(s): TSH, T4TOTAL, T3FREE, THYROIDAB in the last 72 hours.  Invalid input(s): FREET3 _____________  Dg Chest 2 View  Result Date: 09/11/2017 CLINICAL DATA:  High-grade heart  block persisting 24 hours post TAVR. EXAM: CHEST - 2 VIEW COMPARISON:  09/09/2017. FINDINGS: Cardiomegaly.  No infiltrates or edema.  TAVR device. Pacemaker device has been placed via LEFT subclavian approach. One lead appears coiled in the RIGHT atrium. Another lead appears to cross the midline into the RIGHT ventricle. There is no pneumothorax. IMPRESSION: Pacer placement as described.  Cardiomegaly. Electronically Signed   By: Staci Righter M.D.   On: 09/11/2017 09:39   Dg Chest 2 View  Result Date: 09/05/2017 CLINICAL DATA:  Severe aortic stenosis. EXAM: CHEST - 2 VIEW COMPARISON:  Chest x-ray dated August 19, 2017. FINDINGS: Unchanged right chest wall port catheter with the tip at the cavoatrial junction. Stable mild cardiomegaly status post CABG. Normal pulmonary vascularity. Trace bilateral pleural effusions. No consolidation or pneumothorax. No acute osseous abnormality. IMPRESSION: Trace  bilateral pleural effusions. Electronically Signed   By: Titus Dubin M.D.   On: 09/05/2017 15:27   Nm Pet Image Restag (ps) Skull Base To Thigh  Result Date: 09/01/2017 CLINICAL DATA:  Subsequent treatment strategy for diffuse large B-cell lymphoma. Status post 4 cycles first-line therapy. EXAM: NUCLEAR MEDICINE PET SKULL BASE TO THIGH TECHNIQUE: 12.6 mCi F-18 FDG was injected intravenously. Full-ring PET imaging was performed from the skull base to thigh after the radiotracer. CT data was obtained and used for attenuation correction and anatomic localization. Fasting blood glucose: 109 mg/dl Mediastinal blood pool activity: SUV max 2.3 COMPARISON:  3 PET-CT 04/15/2017, 817 18 FINDINGS: NECK: No hypermetabolic lymph nodes in the neck. Incidental CT findings: none CHEST: No hypermetabolic lymph nodes in the chest. No hypermetabolic axillary lymph nodes. Seroma in the LEFT chest wall adjacent to the axilla at lymphadenectomy site. Incidental CT findings: Bilateral pleural effusions are small to moderate and greater on the RIGHT. No suspicious pulmonary nodularity. Port in the RIGHT chest wall with tip in distal SVC ABDOMEN/PELVIS: Normal size spleen with normal metabolic activity. No hypermetabolic abdominopelvic lymph nodes. Incidental CT findings: Atherosclerotic calcification of the aorta. Prostate gland normal SKELETON: No focal hypermetabolic activity to suggest skeletal metastasis. Incidental CT findings: none IMPRESSION: 1. No evidence lymphoma recurrence on skull base to mid thigh FDG PET-CT scan. 2. Bilateral pleural effusions which are small to moderate. Electronically Signed   By: Suzy Bouchard M.D.   On: 09/01/2017 11:40   Dg Chest Port 1 View  Result Date: 09/09/2017 CLINICAL DATA:  Followup TAVR. EXAM: PORTABLE CHEST 1 VIEW COMPARISON:  09/05/2017 FINDINGS: Artifact overlies the chest. Power port is in place. Right internal jugular central line tip is in the SVC 3 cm above the right atrium.  TAVR device in place. Previous median sternotomy and CABG. Aortic atherosclerosis. Mild interstitial edema. No consolidation or collapse. IMPRESSION: TAVR device in place.  Mild edema. Electronically Signed   By: Nelson Chimes M.D.   On: 09/09/2017 10:37   Dg Chest Port 1 View  Result Date: 08/19/2017 CLINICAL DATA:  Respiratory failure EXAM: PORTABLE CHEST 1 VIEW COMPARISON:  08/17/2017 FINDINGS: Prior CABG. Mild hyperinflation/COPD. Right Port-A-Cath is unchanged. Cardiomegaly. No confluent opacities or effusions. No acute bony abnormality. IMPRESSION: No acute findings.  No change. Electronically Signed   By: Rolm Baptise M.D.   On: 08/19/2017 07:14   Dg Chest Port 1 View  Result Date: 08/17/2017 CLINICAL DATA:  Acute respiratory failure. EXAM: PORTABLE CHEST 1 VIEW COMPARISON:  08/16/2017 FINDINGS: Right subclavian Port-A-Cath unchanged with tip over the SVC just above the cavoatrial junction. Lungs are adequately inflated without focal consolidation  or effusion. Remainder of the exam is unchanged. IMPRESSION: No acute cardiopulmonary disease. Right subclavian Port-A-Cath unchanged. Electronically Signed   By: Marin Olp M.D.   On: 08/17/2017 08:38   Dg Chest Portable 1 View  Result Date: 08/16/2017 CLINICAL DATA:  Diarrhea, shortness of breath for 2 days. EXAM: PORTABLE CHEST 1 VIEW COMPARISON:  07/23/2017 FINDINGS: The right chest wall port a catheter is noted with tip at the cavoatrial junction. Previous median sternotomy and CABG procedure. Aortic atherosclerosis noted. Stable moderate cardiac enlargement. No pleural effusion or edema. No airspace opacities identified. IMPRESSION: 1. Cardiac enlargement.  No acute findings. 2.  Aortic Atherosclerosis (ICD10-I70.0). Electronically Signed   By: Kerby Moors M.D.   On: 08/16/2017 19:20   Ct Renal Stone Study  Result Date: 08/16/2017 CLINICAL DATA:  Diarrhea and dyspnea x2 days. EXAM: CT ABDOMEN AND PELVIS WITHOUT CONTRAST TECHNIQUE:  Multidetector CT imaging of the abdomen and pelvis was performed following the standard protocol without IV contrast. COMPARISON:  None. FINDINGS: Lower chest: Low-density appearance of the blood pool within the cardiac chambers with visualization of the interventricular septum compatible with anemia. Bibasilar dependent atelectasis. Small hiatal hernia. Hepatobiliary: No focal liver abnormality is seen. No gallstones, gallbladder wall thickening, or biliary dilatation. Pancreas: Unremarkable. No pancreatic ductal dilatation or surrounding inflammatory changes. Spleen: Normal in size without focal abnormality. Adrenals/Urinary Tract: Normal bilateral adrenal glands. No nephrolithiasis nor obstructive uropathy. Physiologic distention of the urinary bladder without focal mural thickening or calculus. Several small bladder diverticula are noted along the dome and posterior wall. Stomach/Bowel: Stomach is within normal limits. Appendix is surgically absent by report. No evidence of bowel wall thickening, distention, or inflammatory changes. Vascular/Lymphatic: Moderate aortoiliac atherosclerosis without aneurysm. Atherosclerotic calcifications at the origins of both renal arteries without significant renal cortical scarring. Mild ectasia of the right common iliac artery to 2.2 cm. No pelvic sidewall, inguinal nor abdominal adenopathy. Reproductive: Mild enlargement of the prostate impressing upon the base of the bladder measuring up to 5.9 cm craniocaudad. Normal seminal vesicles. Other: No free air nor free fluid. Musculoskeletal: No acute nor suspicious osseous abnormalities. IMPRESSION: 1. Visualization of the interventricular septum with hypodense appearance of the blood pool compatible with anemia. 2. Bladder diverticulosis. 3. No acute bowel inflammation or inflammation. 4. Moderate aortoiliac and branch vessel atherosclerosis. Electronically Signed   By: Ashley Royalty M.D.   On: 08/16/2017 20:24   Disposition    Pt is being discharged home today in good condition.  Follow-up Plans & Appointments    Follow-up Information    Almyra Deforest, [place] Follow up on 09/18/2017.   Specialties:  Cardiology, Radiology Why:  at 3:30pm for your follow up appt.  Contact information: [address] [place] [address] [phone]          Discharge Instructions    Amb Referral to Cardiac Rehabilitation   Complete by:  As directed    Diagnosis:  Valve Replacement   Valve:  Aortic Comment - TAVR   Call MD for:  redness, tenderness, or signs of infection (pain, swelling, redness, odor or green/yellow discharge around incision site)   Complete by:  As directed    Diet - low sodium heart healthy   Complete by:  As directed    Discharge instructions   Complete by:  As directed    ACTIVITY AND EXERCISE . Daily activity and exercise are an important part of your recovery. People recover at different rates depending on their general health and  type of valve procedure. . Most people require six to 10 weeks to feel recovered. . No lifting, pushing, pulling more than 10 pounds (examples to avoid: groceries, vacuuming, gardening, golfing):  - For one week with a procedure through the groin.  - For six weeks for procedures through the chest wall.  - For three months for procedures through the breast-bone. NOTE: You will typically see one of our providers 7-10 days after your procedure to discuss [place] the above activities.    DRIVING . Do not drive for four weeks after the date of your procedure. . If you have been told by your doctor in the past that you may not drive, you must talk with him/her before you begin driving again. . When you resume driving, you must have someone with you.   DRESSING . Groin site: you may leave the clear dressing over the site for up to one week or until it falls off.   HYGIENE . If you had a femoral (leg) procedure, you may take a shower when you return  home. After the shower, pat the site dry. Do NOT use powder, oils or lotions in your groin area until the site has completely healed. . If you had a chest procedure, you may shower when you return home unless specifically instructed not to by your discharging practitioner.  - DO NOT scrub incision; pat dry with a towel  - DO NOT apply any lotions, oils, powders to the incision  - No tub baths / swimming for at least six weeks. . If you notice any fevers, chills, increased pain, swelling, bleeding or pus, please contact your doctor.  ADDITIONAL INFORMATION . If you are going to have an upcoming dental procedure, please contact our office as you may require antibiotics ahead of time to prevent infection on your heart valve.   Increase activity slowly   Complete by:  As directed       Discharge Medications     Medication List    STOP taking these medications   polyethylene glycol packet Commonly known as:  MIRALAX / GLYCOLAX   prochlorperazine 10 MG tablet Commonly known as:  COMPAZINE   senna-docusate 8.6-50 MG tablet Commonly known as:  Senokot-S     TAKE these medications   allopurinol 300 MG tablet Commonly known as:  ZYLOPRIM Take 450 mg by mouth daily.   amLODipine 5 MG tablet Commonly known as:  NORVASC TAKE 2 TABLETS (10 MG TOTAL) BY MOUTH DAILY. What changed:  See the new instructions.   apixaban 5 MG Tabs tablet Commonly known as:  ELIQUIS Take 1 tablet (5 mg total) by mouth 2 (two) times daily.   aspirin 81 MG tablet Take 81 mg by mouth daily.   atorvastatin 80 MG tablet Commonly known as:  LIPITOR TAKE ONE TABLET BY MOUTH AT BEDTIME   esomeprazole 40 MG capsule Commonly known as:  NEXIUM Take 1 capsule (40 mg total) by mouth daily.   ezetimibe 10 MG tablet Commonly known as:  ZETIA TAKE ONE TABLET BY MOUTH DAILY   famciclovir 500 MG tablet Commonly known as:  FAMVIR Take 1 tablet (500 mg total) by mouth daily.   fluticasone 50 MCG/ACT nasal  spray Commonly known as:  FLONASE INHALE ONE SPRAY INTO EACH NOSTRIL TWICE DAILY.   freestyle lancets Use BID to check blood sugar.  DX E11.9   furosemide 40 MG tablet Commonly known as:  LASIX Take 120 mg by mouth 2 (two) times  daily.   glimepiride 2 MG tablet Commonly known as:  AMARYL TAKE 1 TABLET (2 MG TOTAL) BY MOUTH DAILY WITH BREAKFAST.   glucose blood test strip Commonly known as:  FREESTYLE LITE TEST BLOOD SUGAR bid   lidocaine-prilocaine cream Commonly known as:  EMLA Apply to affected area once What changed:    how much to take  how to take this  when to take this  reasons to take this  additional instructions   lipase/protease/amylase 36000 UNITS Cpep capsule Commonly known as:  CREON Take 2 capsules (72,000 Units total) by mouth 3 (three) times daily with meals. 1 capsule with snack.   LORazepam 0.5 MG tablet Commonly known as:  ATIVAN Take 0.5 mg by mouth every 6 (six) hours as needed for anxiety.   METFORMIN HCL PO Take 40 mg by mouth 2 (two) times daily with a meal. Pt takes 1.5 tablets in the AM and 1 tablet in the PM   NEOMYCIN-POLYMYXIN-HYDROCORTISONE 1 % Soln OTIC solution Commonly known as:  CORTISPORIN Apply 1-2 drops to toe BID after soaking   nitroGLYCERIN 0.4 MG SL tablet Commonly known as:  NITROSTAT Place 1 tablet (0.4 mg total) under the tongue every 5 (five) minutes as needed for chest pain.   ondansetron 8 MG tablet Commonly known as:  ZOFRAN Take 1 tablet (8 mg total) by mouth 2 (two) times daily as needed for refractory nausea / vomiting. Start on day 3 after cyclophosphamide chemotherapy.   polycarbophil 625 MG tablet Commonly known as:  FIBERCON Take 625 mg by mouth daily.   potassium chloride SA 20 MEQ tablet Commonly known as:  K-DUR,KLOR-CON Take 40 mEq by mouth 2 (two) times daily.   pregabalin 75 MG capsule Commonly known as:  LYRICA Take 1 capsule (75 mg total) by mouth 3 (three) times daily.   psyllium 58.6  % powder Commonly known as:  METAMUCIL Take 1 packet by mouth 3 (three) times daily.         Outstanding Labs/Studies   CBC at follow up  Duration of Discharge Encounter   Greater than 30 minutes including physician time.  Signed, Reino Bellis NP-C 09/12/2017, 12:57 PM

## 2017-09-15 ENCOUNTER — Telehealth: Payer: Self-pay

## 2017-09-15 NOTE — Telephone Encounter (Signed)
Patient contacted regarding discharge from [facility] on 09/12/2017.  Patient understands to follow up with provider Almyra Deforest PA on 09/18/2017 at 3"30 PM at [facility] office. Patient understands discharge instructions? yes Patient understands medications and regiment? yes Patient understands to bring all medications to this visit? yes  The pt remains sore at pacemaker site. Swelling in arm and legs has improved.  The pt took 1200 steps yesterday without developing SOB.  The pt is slowly working on building up his energy level.

## 2017-09-17 ENCOUNTER — Telehealth (HOSPITAL_COMMUNITY): Payer: Self-pay

## 2017-09-17 NOTE — Telephone Encounter (Signed)
Called patient to see if interested in CR - Patient is not interested in the program as he is currently exercising on his own. Closed referral.

## 2017-09-17 NOTE — Telephone Encounter (Signed)
Patients insurance is active and benefits verified through Medicare A/B - No co-pay, deductible amount of $185.00/$185.00 has been met, no out of pocket, 20% co-insurance, and no pre-authorization is required. Passport/reference [phone].[name]  Patients insurance is active and benefits verified through [employer] [place] - No co-pay, no deductible, no out of pocket, no co-insurance, and no pre-authorization is required. Passport/reference [phone]  Will contact patient to see if interested in the CR Program. If interested, patient will need to complete follow up appt. Once completed, patient will be contacted for scheduling upon review by the RN Navigator.

## 2017-09-18 ENCOUNTER — Telehealth: Payer: Self-pay | Admitting: Cardiovascular Disease

## 2017-09-18 ENCOUNTER — Encounter: Payer: Self-pay | Admitting: Physician Assistant

## 2017-09-18 ENCOUNTER — Encounter: Payer: Self-pay | Admitting: *Deleted

## 2017-09-18 ENCOUNTER — Ambulatory Visit (INDEPENDENT_AMBULATORY_CARE_PROVIDER_SITE_OTHER): Payer: Medicare Other | Admitting: Physician Assistant

## 2017-09-18 VITALS — BP 112/64 | HR 82 | Ht 72.0 in | Wt 231.6 lb

## 2017-09-18 DIAGNOSIS — I248 Other forms of acute ischemic heart disease: Secondary | ICD-10-CM | POA: Diagnosis not present

## 2017-09-18 DIAGNOSIS — Z950 Presence of cardiac pacemaker: Secondary | ICD-10-CM | POA: Diagnosis not present

## 2017-09-18 DIAGNOSIS — G4733 Obstructive sleep apnea (adult) (pediatric): Secondary | ICD-10-CM

## 2017-09-18 DIAGNOSIS — I25810 Atherosclerosis of coronary artery bypass graft(s) without angina pectoris: Secondary | ICD-10-CM

## 2017-09-18 DIAGNOSIS — I4892 Unspecified atrial flutter: Secondary | ICD-10-CM | POA: Diagnosis not present

## 2017-09-18 DIAGNOSIS — E785 Hyperlipidemia, unspecified: Secondary | ICD-10-CM

## 2017-09-18 DIAGNOSIS — Z9989 Dependence on other enabling machines and devices: Secondary | ICD-10-CM

## 2017-09-18 DIAGNOSIS — Z79899 Other long term (current) drug therapy: Secondary | ICD-10-CM

## 2017-09-18 DIAGNOSIS — I10 Essential (primary) hypertension: Secondary | ICD-10-CM | POA: Diagnosis not present

## 2017-09-18 DIAGNOSIS — Z953 Presence of xenogenic heart valve: Secondary | ICD-10-CM

## 2017-09-18 DIAGNOSIS — E119 Type 2 diabetes mellitus without complications: Secondary | ICD-10-CM | POA: Diagnosis not present

## 2017-09-18 NOTE — Telephone Encounter (Signed)
New Message:    Pt is calling about being rescheduled and he is not sure which appt they are wanting to reschedule. I see no documentation. Pls call pt to clarify what needs to be rescheduled.

## 2017-09-18 NOTE — Telephone Encounter (Signed)
Spoke with patient in regards to an appt change from Wednesday 3/27 to Monday 3/25 @ 1230.Richard [name] Patient verbalized understanding.Richard [name]

## 2017-09-18 NOTE — Progress Notes (Signed)
Cardiology Office Note    Date:  09/19/2017   ID:  Richard [name], DOB 1941/06/17, MRN [id number]  PCP:  [name], DO  Cardiologist:  Dr. Percival Spanish  EP: Dr. Caryl Comes  Chief Complaint  Patient presents with  . Hospitalization Follow-up    seen for Dr. Percival Spanish. Post TAVR and PPM    History of Present Illness:  Richard [name] is a 77 y.o. male with CAD s/p CABG (4/5 grafts patent on cath 07/2017), GERD, atrial flutter, HTN, HLD, OSA on CPAP and DM II.His last cardiac catheterization on 03/16/2014 showed 100% mid LAD chronic occlusion, patent LIMA to distal LAD, diffuse 90% stenosis in OM1, occluded OM 2, patent sequential SVG to OM1/OM 2, 99% proximal RCA stenosis with 100% mid RCA occlusion followed by a patent graft to RCA. He does have occluded SVG to diagonal branch. He had a history of bradycardia and was taken off his beta blocker. He was given a 24-hour Holter monitor and plan for repeat echocardiogram.Holter monitor showed occasional transient 2-1 heart block, he is on low-dose beta blocker, however he is asymptomatic, therefore no medication adjustment was made. As far as echocardiogram, it does show severe aortic stenosis.  He was diagnosed with diffuse large cell non-Hodgkin's lymphoma through biopsy of the left axillary lymph node in August 2018. He underwent Port-A-Cath placement on 03/06/2017 and was subsequently started on chemotherapy.  I last saw the patient on 05/13/2017, he had 3+ pitting edema in bilateral lower extremity.  I increased his Lasix to 120 mg twice daily.  I also increase his potassium supplement to 40 mEq twice daily as well.  Repeat echocardiogram obtained on 05/16/2017 showed EF 55-60%, severe aortic stenosis with mean gradient 54 mmHg, mildly dilated a sending aorta, mild MR. He was admitted for NSTEMI in January, and underwent cardiac catheterization on 07/28/2017 which showed severe three-vessel native CAD with total occlusion of LAD, ramus  intermedius, first OM and RCA, patent RIMA to PDA, LIMA to LAD, sequential SVG to ramus intermedius and first OM.  Severe calcific aortic stenosis with mean transvalvular gradient of 51 mmHg.  He was readmitted in February 2019 for possible GI bleed and pancytopenia, he developed new onset of atrial flutter during that admission, he is not considered a candidate for full dose anticoagulation due to anemia and GI bleed.  He eventually underwent 26 mm TAVR with Edwards Sapien 3 THV by Dr. Angelena Form and Cyndia Bent.  Postop, patient did develop 2-1 heart block on no AV nodal blocking agent.  He was seen by Dr. Caryl Comes in the agreed to undergo pacemaker implantation.  He also developed left arm edema after pacemaker implantation, venous Doppler was negative for DVT.  He was started on Eliquis during the recent admission for his atrial fibrillation/atrial flutter.   Patient presents today for cardiology office visit.  Since his recent TAVR, he is doing much better.  He is able to ambulate several times throughout the day without any chest pain or shortness of breath.  He is quite pleasantly surprised about the result of the surgery.  He does not have any lower extremity edema, orthopnea or PND.  He has been taking Eliquis without any issue, there is no blood in the stool or blood in the urine.    Past Medical History:  Diagnosis Date  . Anemia   . Axillary adenopathy 02/25/2017  . Bradycardia    a. holter monitor has demonstrated HRs in 30s and [place]   .  CAD (coronary artery disease)    a. s/p CABG 2001  . Chronic lower back pain   . Diffuse large B cell lymphoma ([place])   . Diverticulosis   . Esophageal stricture   . GERD (gastroesophageal reflux disease)   . History of gout   . HTN (hypertension)   . Mixed hyperlipidemia   . OSA on CPAP    with 2L O2 at night  . Pancytopenia ([place])    a. related to chemo therapy for B cell lymphoma  . Peptic stricture of esophagus   . Severe aortic stenosis   .  Spinal stenosis   . Type II diabetes mellitus ([place])     Past Surgical History:  Procedure Laterality Date  . APPENDECTOMY  ~ 1952  . BACK SURGERY    . CATARACT EXTRACTION W/PHACO Left 11/06/2016   Procedure: CATARACT EXTRACTION PHACO AND INTRAOCULAR LENS PLACEMENT (IOC);  Surgeon: Estill Cotta, MD;  Location: [facility];  Service: Ophthalmology;  Laterality: Left;  Lot # [id number] H [place]: 01:09.4 AP%:25.2 CDE: 30.64  . CATARACT EXTRACTION W/PHACO Right 12/04/2016   Procedure: CATARACT EXTRACTION PHACO AND INTRAOCULAR LENS PLACEMENT (IOC);  Surgeon: Estill Cotta, MD;  Location: [facility];  Service: Ophthalmology;  Laterality: Right;  [place] 1:25.9 AP% 24.1 CDE 39.10 Fluid Pack lot # [id number]  . COLONOSCOPY W/ BIOPSIES AND POLYPECTOMY  2013  . CORONARY ANGIOPLASTY  1993  . CORONARY ANGIOPLASTY WITH STENT PLACEMENT  05/1997   "1"  . CORONARY ARTERY BYPASS GRAFT  03/2000   "CABG X5"  . ESOPHAGEAL DILATION  X 3-4   Dr. Lyla Son; "last one was in the 1990's"  . ESOPHAGOGASTRODUODENOSCOPY     multiple  . FLEXIBLE SIGMOIDOSCOPY     multiple  . HYDRADENITIS EXCISION Left 02/25/2017   Procedure: EXCISION DEEP LEFT AXILLARY LYMPH NODE;  Surgeon: Fanny Skates, MD;  [facility];  Service: General;  Laterality: Left;  . INTRAOPERATIVE TRANSTHORACIC ECHOCARDIOGRAM N/A 09/09/2017   Procedure: INTRAOPERATIVE TRANSTHORACIC ECHOCARDIOGRAM;  Surgeon: Burnell Blanks, MD;  Location: [place];  Service: Open Heart Surgery;  Laterality: N/A;  . KNEE ARTHROSCOPY Left 2011   meniscus repair  . LEFT HEART CATHETERIZATION WITH CORONARY/GRAFT ANGIOGRAM N/A 03/16/2014   Procedure: LEFT HEART CATHETERIZATION WITH Beatrix Fetters;  Surgeon: Burnell Blanks, MD;  Location[facility];  Service: Cardiovascular;  Laterality: N/A;  . LUMBAR LAMINECTOMY/DECOMPRESSION MICRODISCECTOMY Right 06/17/2013   Procedure: LUMBAR LAMINECTOMY MICRODISCECTOMY L4-L5 RIGHT EXCISION OF SYNOVIAL CYST RIGHT    (1 LEVEL) RIGHT PARTIAL FACETECTOMY;  Surgeon: Tobi Bastos, MD;  Location: [facility];  Service: Orthopedics;  Laterality: Right;  . MYELOGRAM  04/06/13   lumbar, Dr Gladstone Lighter  . PACEMAKER IMPLANT N/A 09/10/2017   Procedure: PACEMAKER IMPLANT;  Surgeon: Deboraha Sprang, MD;  Location: [place] CV LAB;  Service: Cardiovascular;  Laterality: N/A;  . PANENDOSCOPY    . PORTACATH PLACEMENT N/A 03/06/2017   Procedure: INSERTION PORT-A-CATH AND ASPIRATE SEROMA LEFT AXILLA;  Surgeon: Fanny Skates, MD;  Location: [place];  Service: General;  Laterality: N/A;  . RIGHT/LEFT HEART CATH AND CORONARY/GRAFT ANGIOGRAPHY N/A 07/28/2017   Procedure: RIGHT/LEFT HEART CATH AND CORONARY/GRAFT ANGIOGRAPHY;  Surgeon: Sherren Mocha, MD;  Location: [place] CV LAB;  Service: Cardiovascular;  Laterality: N/A;  . SHOULDER SURGERY Right 08/2010   screws placed; "tendons tore off"  . SKIN CANCER EXCISION Right    "neck"  . TONSILLECTOMY  ~ 1954  . TRANSCATHETER AORTIC VALVE REPLACEMENT, TRANSFEMORAL N/A 09/09/2017  Procedure: TRANSCATHETER AORTIC VALVE REPLACEMENT, TRANSFEMORAL;  Surgeon: Burnell Blanks, MD;  Location: [place];  Service: Open Heart Surgery;  Laterality: N/A;  . UPPER GI ENDOSCOPY  2013   Gastritis; Dr Carlean Purl  . VASECTOMY      Current Medications: Outpatient Medications Prior to Visit  Medication Sig Dispense Refill  . allopurinol (ZYLOPRIM) 300 MG tablet Take 450 mg by mouth daily.     [name] amLODipine (NORVASC) 5 MG tablet TAKE 2 TABLETS (10 MG TOTAL) BY MOUTH DAILY. (Patient taking differently: Take 1 tablet BID) 60 tablet 11  . apixaban (ELIQUIS) 5 MG TABS tablet Take 1 tablet (5 mg total) by mouth 2 (two) times daily. 60 tablet 1  . aspirin 81 MG tablet Take 81 mg by mouth daily.    [name] atorvastatin (LIPITOR) 80 MG tablet TAKE ONE TABLET BY MOUTH AT BEDTIME 90 tablet 3  . esomeprazole (NEXIUM) 40 MG capsule Take 1 capsule (40 mg total) by mouth daily. 30 capsule 5  . ezetimibe (ZETIA) 10  MG tablet TAKE ONE TABLET BY MOUTH DAILY 90 tablet 3  . famciclovir (FAMVIR) 500 MG tablet Take 1 tablet (500 mg total) by mouth daily. 30 tablet 12  . fluticasone (FLONASE) 50 MCG/ACT nasal spray INHALE ONE SPRAY INTO EACH NOSTRIL TWICE DAILY. 16 g 6  . furosemide (LASIX) 40 MG tablet Take 120 mg by mouth 2 (two) times daily.    [name] glimepiride (AMARYL) 2 MG tablet TAKE 1 TABLET (2 MG TOTAL) BY MOUTH DAILY WITH BREAKFAST. 90 tablet 0  . glucose blood (FREESTYLE LITE) test strip TEST BLOOD SUGAR bid 100 each 11  . Lancets (FREESTYLE) lancets Use BID to check blood sugar.  DX E11.9 100 each 6  . lipase/protease/amylase (CREON) 36000 UNITS CPEP capsule Take 2 capsules (72,000 Units total) by mouth 3 (three) times daily with meals. 1 capsule with snack. 210 capsule 2  . METFORMIN HCL PO Take 40 mg by mouth 2 (two) times daily with a meal. Pt takes 1.5 tablets in the AM and 1 tablet in the PM    . nitroGLYCERIN (NITROSTAT) 0.4 MG SL tablet Place 1 tablet (0.4 mg total) under the tongue every 5 (five) minutes as needed for chest pain. 25 tablet 3  . polycarbophil (FIBERCON) 625 MG tablet Take 625 mg by mouth daily.    . potassium chloride SA (K-DUR,KLOR-CON) 20 MEQ tablet Take 40 mEq by mouth 2 (two) times daily.    . pregabalin (LYRICA) 75 MG capsule Take 1 capsule (75 mg total) by mouth 3 (three) times daily. 270 capsule 3  . psyllium (METAMUCIL) 58.6 % powder Take 1 packet by mouth 3 (three) times daily.    [name] lidocaine-prilocaine (EMLA) cream Apply to affected area once (Patient taking differently: Apply 1 application topically as needed (to port). ) 30 g 3  . LORazepam (ATIVAN) 0.5 MG tablet Take 0.5 mg by mouth every 6 (six) hours as needed for anxiety.    . NEOMYCIN-POLYMYXIN-HYDROCORTISONE (CORTISPORIN) 1 % SOLN OTIC solution Apply 1-2 drops to toe BID after soaking 10 mL 1  . ondansetron (ZOFRAN) 8 MG tablet Take 1 tablet (8 mg total) by mouth 2 (two) times daily as needed for refractory nausea /  vomiting. Start on day 3 after cyclophosphamide chemotherapy. 30 tablet 1   Facility-Administered Medications Prior to Visit  Medication Dose Route Frequency Provider Last Rate Last Dose  . sodium chloride flush (NS) 0.9 % injection 10 mL  10 mL Intravenous PRN Ennever,  Rudell Cobb, MD   10 mL at 05/30/17 0920     Allergies:   Benazepril; Hctz [hydrochlorothiazide]; Aspirin; and Lactose intolerance (gi)   Social History   Socioeconomic History  . Marital status: Divorced    Spouse name: Not on file  . Number of children: 2  . Years of education: Not on file  . Highest education level: Not on file  Occupational History    Employer: RETIRED  Social Needs  . Financial resource strain: Not on file  . Food insecurity:    Worry: Not on file    Inability: Not on file  . Transportation needs:    Medical: Not on file    Non-medical: Not on file  Tobacco Use  . Smoking status: Never Smoker  . Smokeless tobacco: Never Used  Substance and Sexual Activity  . Alcohol use: No    Alcohol/week: 0.0 oz    Comment: "last drink was in 2012"( 03/15/2014)  . Drug use: No  . Sexual activity: Not Currently  Lifestyle  . Physical activity:    Days per week: Not on file    Minutes per session: Not on file  . Stress: Not on file  Relationships  . Social connections:    Talks on phone: Not on file    Gets together: Not on file    Attends religious service: Not on file    Active member of club or organization: Not on file    Attends meetings of clubs or organizations: Not on file    Relationship status: Not on file  Other Topics Concern  . Not on file  Social History Narrative   Divorced, lives with a roommate.   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     Family History:  The patient's family history includes Diabetes  in his maternal grandmother; Heart attack in his paternal grandmother; Hypertension in his father; Pancreatic cancer in his mother; Stroke in his father and maternal grandmother.   ROS:   Please see the history of present illness.    ROS All other systems reviewed and are negative.   PHYSICAL EXAM:   VS:  BP 112/64   Pulse 82   Ht 6' (1.829 m)   Wt 231 lb 9.6 oz (105.1 kg)   BMI 31.41 kg/m    GEN: Well nourished, well developed, in no acute distress  HEENT: normal  Neck: no JVD, carotid bruits, or masses Cardiac: RRR; no murmurs, rubs, or gallops,no edema  Respiratory:  clear to auscultation bilaterally, normal work of breathing GI: soft, nontender, nondistended, + BS MS: no deformity or atrophy  Skin: warm and dry, no rash Neuro:  Alert and Oriented x 3, Strength and sensation are intact Psych: euthymic mood, full affect  Wt Readings from Last 3 Encounters:  09/18/17 231 lb 9.6 oz (105.1 kg)  09/12/17 240 lb 8.4 oz (109.1 kg)  09/05/17 246 lb 3.2 oz (111.7 kg)      Studies/Labs Reviewed:   EKG:  EKG is not ordered today.    Recent Labs: 08/18/2017: TSH 2.101 09/05/2017: ALT 19; B Natriuretic Peptide 1,104.8 09/10/2017: Magnesium 1.8 09/12/2017: BUN 11; Creatinine, Ser 0.87; Potassium 3.7; Sodium 138 09/18/2017: Hemoglobin 11.5; Platelets 69   Lipid Panel    Component Value Date/Time   CHOL 111 08/05/2017 1150  TRIG 218.0 (H) 08/05/2017 1150   HDL 27.30 (L) 08/05/2017 1150   CHOLHDL 4 08/05/2017 1150   VLDL 43.6 (H) 08/05/2017 1150   LDLCALC 57 11/21/2016 1040   LDLDIRECT 51.0 08/05/2017 1150    Additional studies/ records that were reviewed today include:   Echo 09/10/2017 LV EF: 55% -   60%  ------------------------------------------------------------------- Indications:      (Post TAVR evaluation).  ------------------------------------------------------------------- History:   PMH:  26 mm Edwards sapien valve. AV block. Bradycardia.  Coronary artery  disease.  Congestive heart failure. PMH:   Myocardial infarction.  Risk factors:  Hypertension. Dyslipidemia.  ------------------------------------------------------------------- Study Conclusions  - Left ventricle: The cavity size was normal. Wall thickness was   increased in a pattern of moderate LVH. Systolic function was   normal. The estimated ejection fraction was in the range of 55%   to 60%. Incoordinate septal motion. Doppler parameters are   consistent with diastolic dysfunction and elevated LV filling   pressure. - Ventricular septum: The contour showed diastolic flattening and   systolic flattening. - Aortic valve: s/p Edwards Sapien 3 TAVR. Trivial perivalvular   leak noted. No obstruction. Mean gradient (S): 14 mm Hg. Peak   gradient (S): 27 mm Hg. Valve area (VTI): 1.76 cm^2. Valve area   (Vmax): 2.01 cm^2. Valve area (Vmean): 2.39 cm^2. - Mitral valve: Calcified annulus. Mildly thickened leaflets .   There was moderate regurgitation. - Left atrium: The atrium was mildly dilated. - Right atrium: The atrium was mildly dilated. - Tricuspid valve: There was moderate regurgitation. - Pulmonary arteries: PA peak pressure: 53 mm Hg (S). - Line: A venous catheter or wire was visualized in the inferior   vena cava, with its tip in the right atrium. No abnormal features   noted. - Inferior vena cava: The vessel was dilated. The respirophasic   diameter changes were blunted (< 50%), consistent with elevated   central venous pressure. - Pericardium, extracardiac: There was no pericardial effusion.  Impressions:  - Compared to an echo yesterday, the LVEF has improved to 55-60%.   The TAVR valve is stable with trivial paravalvular leak. There is   a catheter or possible transvenous pacing wire noted in the IVC,   extending in to the RV. There is moderate MR and TR, noted   previously. No pericardial effusion.    ASSESSMENT:    1. S/p TAVR (transcatheter aortic valve  replacement), bioprosthetic   2. Medication management   3. Coronary artery disease involving coronary bypass graft of native heart without angina pectoris   4. Atrial flutter, unspecified type ([place])   5. Essential hypertension   6. Hyperlipidemia, unspecified hyperlipidemia type   7. OSA on CPAP   8. Controlled type 2 diabetes mellitus without complication, without long-term current use of insulin ([place])   9. Pacemaker      PLAN:  In order of problems listed above:  1. S/p TAVR: Pending upcoming visit with Dr. Angelena Form and a repeat echocardiogram.  Recently echocardiogram showed stable bioprosthetic valve.  2. CAD s/p CABG: Denies any recent chest pain.  Stable anatomy on recent cardiac catheterization 07/20/2017.  3. 2-1 AV block s/p PPM: Underwent Medtronic MRI compatible dual-chamber pacemaker by Dr. Caryl Comes on 09/10/2017.  4. Atrial flutter: On Eliquis 5 mg twice daily.  Denies any bleeding issues.  Obtain CBC today  5. Hypertension: Blood pressure very well controlled  6. Hyperlipidemia: On Lipitor 80 mg daily and zetia.  Lipid panel obtained on 08/05/2017 showed HDL  27, triglyceride 218, total cholesterol 111.  Consider add fenofibrate on follow-up.  7. DM 2: Managed by primary care provider    Medication Adjustments/Labs and Tests Ordered: Current medicines are reviewed at length with the patient today.  Concerns regarding medicines are outlined above.  Medication changes, Labs and Tests ordered today are listed in the Patient Instructions below. Patient Instructions  Medication Instructions:  Continue current medications  If you need a refill on your cardiac medications before your next appointment, please call your pharmacy.  Labwork: CBC HERE IN OUR OFFICE AT [facility]  Take the provided lab slips for you to take with you to the lab for you blood draw.   You will NOT need to fast   Testing/Procedures: None Ordered   Follow-Up: Your physician wants you to follow-up  in: 3-4 Months with Dr Percival Spanish.    Thank you for choosing [facility] at [facility], [place]  09/19/2017 3:04 PM    [facility] [place], [place], [place]  [address] Phone: [phone]; Fax: [phone]

## 2017-09-18 NOTE — Patient Instructions (Signed)
Medication Instructions:  Continue current medications  If you need a refill on your cardiac medications before your next appointment, please call your pharmacy.  Labwork: CBC HERE IN OUR OFFICE AT [facility]  Take the provided lab slips for you to take with you to the lab for you blood draw.   You will NOT need to fast   Testing/Procedures: None Ordered   Follow-Up: Your physician wants you to follow-up in: 3-4 Months with Dr Percival Spanish.    Thank you for choosing [facility] at [facility]!!

## 2017-09-19 ENCOUNTER — Encounter: Payer: Self-pay | Admitting: Physician Assistant

## 2017-09-19 LAB — CBC
Hematocrit: 36.7 % — ABNORMAL LOW (ref 37.5–51.0)
Hemoglobin: 11.5 g/dL — ABNORMAL LOW (ref 13.0–17.7)
MCH: 28.2 pg (ref 26.6–33.0)
MCHC: 31.3 g/dL — AB (ref 31.5–35.7)
MCV: 90 fL (ref 79–97)
Platelets: 69 10*3/uL — CL (ref 150–379)
RBC: 4.08 x10E6/uL — ABNORMAL LOW (ref 4.14–5.80)
RDW: 18.4 % — AB (ref 12.3–15.4)
WBC: 2.8 10*3/uL — ABNORMAL LOW (ref 3.4–10.8)

## 2017-09-19 NOTE — Progress Notes (Signed)
Red blood cell count certainly has improved since the recent surgery. Platelet level dropped slightly, continue to followup with Dr. Marin Olp of oncology

## 2017-09-22 ENCOUNTER — Ambulatory Visit (INDEPENDENT_AMBULATORY_CARE_PROVIDER_SITE_OTHER): Payer: Medicare Other | Admitting: Nurse Practitioner

## 2017-09-22 DIAGNOSIS — I441 Atrioventricular block, second degree: Secondary | ICD-10-CM | POA: Diagnosis not present

## 2017-09-22 NOTE — Progress Notes (Signed)
Wound check in clinic.   Chanetta Marshall, NP 09/22/2017 3:11 PM

## 2017-09-24 ENCOUNTER — Ambulatory Visit: Payer: Medicare Other

## 2017-09-29 ENCOUNTER — Encounter: Payer: Self-pay | Admitting: Family Medicine

## 2017-09-29 ENCOUNTER — Ambulatory Visit: Payer: Self-pay | Admitting: *Deleted

## 2017-09-29 ENCOUNTER — Ambulatory Visit (INDEPENDENT_AMBULATORY_CARE_PROVIDER_SITE_OTHER): Payer: Medicare Other | Admitting: Family Medicine

## 2017-09-29 VITALS — BP 126/60 | HR 67 | Temp 97.6°F | Resp 14 | Ht 72.0 in | Wt 239.6 lb

## 2017-09-29 DIAGNOSIS — I248 Other forms of acute ischemic heart disease: Secondary | ICD-10-CM | POA: Diagnosis not present

## 2017-09-29 DIAGNOSIS — S61412A Laceration without foreign body of left hand, initial encounter: Secondary | ICD-10-CM

## 2017-09-29 MED ORDER — CEPHALEXIN 500 MG PO CAPS: 500.0000 mg | ORAL_CAPSULE | Freq: Two times a day (BID) | ORAL | 0 refills | Status: DC

## 2017-09-29 MED ORDER — TETANUS-DIPHTHERIA TOXOIDS TD 5-2 LFU IM INJ
0.5000 mL | INJECTION | Freq: Once | INTRAMUSCULAR | Status: AC
  Administered 2017-09-29: 0.5 mL via INTRAMUSCULAR

## 2017-09-29 NOTE — Telephone Encounter (Signed)
The  Laceration  Is  Not  On the  Foot it is  On  His  Hand   Pt  Has   Diabetes    Is  On  eliquis   And   Sustained  A  Superficial  laceration on his  l hand that continues to bleed after 5  Days except when he applys direct pressure. His hand was already bruised from recent iv and bruising from recent procedure. He has been putting gauze on wound but it bleeds thru the gauzeTet UTD.Appointment made today with Dr Carollee Herter  The  Wound is  About 1/2  Inch in length  Reason for Disposition . [1] Has diabetes (diabetes mellitus) AND [2] minor cut or scratch on foot  Additional Information . Commented on: [1] After 14 days AND [2] wound isn't healed    Lac  Was   5   Days  Ago  On  eliquis  And  stiil  Bleeds  Answer Assessment - Initial Assessment Questions Has  Been applying  Pressure  Protocols used: CUTS AND LACERATIONS-A-AH

## 2017-09-29 NOTE — Patient Instructions (Signed)
Laceration Care, Adult A laceration is a cut that goes through all layers of the skin. The cut also goes into the tissue that is right under the skin. Some cuts heal on their own. Others need to be closed with stitches (sutures), staples, skin adhesive strips, or wound glue. Taking care of your cut lowers your risk of infection and helps your cut to heal better. How to take care of your cut For stitches or staples:  Keep the wound clean and dry.  If you were given a bandage (dressing), you should change it at least one time per day or as told by your doctor. You should also change it if it gets wet or dirty.  Keep the wound completely dry for the first 24 hours or as told by your doctor. After that time, you may take a shower or a bath. However, make sure that the wound is not soaked in water until after the stitches or staples have been removed.  Clean the wound one time each day or as told by your doctor: ? Wash the wound with soap and water. ? Rinse the wound with water until all of the soap comes off. ? Pat the wound dry with a clean towel. Do not rub the wound.  After you clean the wound, put a thin layer of antibiotic ointment on it as told by your doctor. This ointment: ? Helps to prevent infection. ? Keeps the bandage from sticking to the wound.  Have your stitches or staples removed as told by your doctor. If your doctor used skin adhesive strips:  Keep the wound clean and dry.  If you were given a bandage, you should change it at least one time per day or as told by your doctor. You should also change it if it gets dirty or wet.  Do not get the skin adhesive strips wet. You can take a shower or a bath, but be careful to keep the wound dry.  If the wound gets wet, pat it dry with a clean towel. Do not rub the wound.  Skin adhesive strips fall off on their own. You can trim the strips as the wound heals. Do not remove any strips that are still stuck to the wound. They will fall  off after a while. If your doctor used wound glue:  Try to keep your wound dry, but you may briefly wet it in the shower or bath. Do not soak the wound in water, such as by swimming.  After you take a shower or a bath, gently pat the wound dry with a clean towel. Do not rub the wound.  Do not do any activities that will make you really sweaty until the skin glue has fallen off on its own.  Do not apply liquid, cream, or ointment medicine to your wound while the skin glue is still on.  If you were given a bandage, you should change it at least one time per day or as told by your doctor. You should also change it if it gets dirty or wet.  If a bandage is placed over the wound, do not let the tape for the bandage touch the skin glue.  Do not pick at the glue. The skin glue usually stays on for 5-10 days. Then, it falls off of the skin. General Instructions  To help prevent scarring, make sure to cover your wound with sunscreen whenever you are outside after stitches are removed, after adhesive strips are removed, or   when wound glue stays in place and the wound is healed. Make sure to wear a sunscreen of at least 30 SPF.  Take over-the-counter and prescription medicines only as told by your doctor.  If you were given antibiotic medicine or ointment, take or apply it as told by your doctor. Do not stop using the antibiotic even if your wound is getting better.  Do not scratch or pick at the wound.  Keep all follow-up visits as told by your doctor. This is important.  Check your wound every day for signs of infection. Watch for: ? Redness, swelling, or pain. ? Fluid, blood, or pus.  Raise (elevate) the injured area above the level of your heart while you are sitting or lying down, if possible. Get help if:  You got a tetanus shot and you have any of these problems at the injection site: ? Swelling. ? Very bad pain. ? Redness. ? Bleeding.  You have a fever.  A wound that was  closed breaks open.  You notice a bad smell coming from your wound or your bandage.  You notice something coming out of the wound, such as wood or glass.  Medicine does not help your pain.  You have more redness, swelling, or pain at the site of your wound.  You have fluid, blood, or pus coming from your wound.  You notice a change in the color of your skin near your wound.  You need to change the bandage often because fluid, blood, or pus is coming from the wound.  You start to have a new rash.  You start to have numbness around the wound. Get help right away if:  You have very bad swelling around the wound.  Your pain suddenly gets worse and is very bad.  You notice painful lumps near the wound or on skin that is anywhere on your body.  You have a red streak going away from your wound.  The wound is on your hand or foot and you cannot move a finger or toe like you usually can.  The wound is on your hand or foot and you notice that your fingers or toes look pale or bluish. This information is not intended to replace advice given to you by your health care provider. Make sure you discuss any questions you have with your health care provider. Document Released: 12/04/2007 Document Revised: 11/23/2015 Document Reviewed: 06/13/2014 Elsevier Interactive Patient Education  2018 Elsevier Inc.  

## 2017-09-29 NOTE — Progress Notes (Signed)
Subjective:  I acted as a Education administrator for [employer]. Yancey Flemings, [place]   Patient ID: Richard [name], male    DOB: 1941/01/22, 77 y.o.   MRN: [id number]  Chief Complaint  Patient presents with  . Laceration    left hand    HPI  Patient is in today for laceration on left hand.  He slammed his hand in the storm door.  His skin was cut and because of the eliquis he had trouble getting it to stop bleeding but with pressure it finally did stop.  He just wants [place] to check it.    Patient Care Team: Carollee Herter, Alferd Apa, DO as PCP - General (Family Medicine) Minus Breeding, MD as PCP - Cardiology (Cardiology) Dingeldein, Remo Lipps, MD (Ophthalmology) Rigoberto Noel, MD as Consulting Physician (Pulmonary Disease) [name], MD as Consulting Physician (Oncology) Minus Breeding, MD as Consulting Physician (Cardiology) Parrett, Fonnie Mu, NP as Nurse Practitioner (Pulmonary Disease) [facility] (Dermatology) Latanya Maudlin, MD as Consulting Physician (Orthopedic Surgery)   Past Medical History:  Diagnosis Date  . Anemia   . Axillary adenopathy 02/25/2017  . Bradycardia    a. holter monitor has demonstrated HRs in 30s and [place]   . CAD (coronary artery disease)    a. s/p CABG 2001  . Chronic lower back pain   . Diffuse large B cell lymphoma ([place])   . Diverticulosis   . Esophageal stricture   . GERD (gastroesophageal reflux disease)   . History of gout   . HTN (hypertension)   . Mixed hyperlipidemia   . OSA on CPAP    with 2L O2 at night  . Pancytopenia ([place])    a. related to chemo therapy for B cell lymphoma  . Peptic stricture of esophagus   . Severe aortic stenosis   . Spinal stenosis   . Type II diabetes mellitus ([place])     Past Surgical History:  Procedure Laterality Date  . APPENDECTOMY  ~ 1952  . BACK SURGERY    . CATARACT EXTRACTION W/PHACO Left 11/06/2016   Procedure: CATARACT EXTRACTION PHACO AND INTRAOCULAR LENS PLACEMENT (IOC);  Surgeon: Estill Cotta, MD;  Location: [facility] ORS;  Service: Ophthalmology;  Laterality: Left;  Lot # [id number] H [place]: 01:09.4 AP%:25.2 CDE: 30.64  . CATARACT EXTRACTION W/PHACO Right 12/04/2016   Procedure: CATARACT EXTRACTION PHACO AND INTRAOCULAR LENS PLACEMENT (IOC);  Surgeon: Estill Cotta, MD;  Location: [facility] ORS;  Service: Ophthalmology;  Laterality: Right;  [place] 1:25.9 AP% 24.1 CDE 39.10 Fluid Pack lot # [id number]  . COLONOSCOPY W/ BIOPSIES AND POLYPECTOMY  2013  . CORONARY ANGIOPLASTY  1993  . CORONARY ANGIOPLASTY WITH STENT PLACEMENT  05/1997   "1"  . CORONARY ARTERY BYPASS GRAFT  03/2000   "CABG X5"  . ESOPHAGEAL DILATION  X 3-4   Dr. Lyla Son; "last one was in the 1990's"  . ESOPHAGOGASTRODUODENOSCOPY     multiple  . FLEXIBLE SIGMOIDOSCOPY     multiple  . HYDRADENITIS EXCISION Left 02/25/2017   Procedure: EXCISION DEEP LEFT AXILLARY LYMPH NODE;  Surgeon: Fanny Skates, MD;  Location: [place];  Service: General;  Laterality: Left;  . INTRAOPERATIVE TRANSTHORACIC ECHOCARDIOGRAM N/A 09/09/2017   Procedure: INTRAOPERATIVE TRANSTHORACIC ECHOCARDIOGRAM;  Surgeon: Burnell Blanks, MD;  Location: [place];  Service: Open Heart Surgery;  Laterality: N/A;  . KNEE ARTHROSCOPY Left 2011   meniscus repair  . LEFT HEART CATHETERIZATION WITH CORONARY/GRAFT ANGIOGRAM N/A 03/16/2014   Procedure: LEFT HEART CATHETERIZATION WITH CORONARY/GRAFT ANGIOGRAM;  Surgeon: Burnell Blanks, MD;  Location: [facility] CATH LAB;  Service: Cardiovascular;  Laterality: N/A;  . LUMBAR LAMINECTOMY/DECOMPRESSION MICRODISCECTOMY Right 06/17/2013   Procedure: LUMBAR LAMINECTOMY MICRODISCECTOMY L4-L5 RIGHT EXCISION OF SYNOVIAL CYST RIGHT   (1 LEVEL) RIGHT PARTIAL FACETECTOMY;  Surgeon: Tobi Bastos, MD;  Location: [facility];  Service: Orthopedics;  Laterality: Right;  . MYELOGRAM  04/06/13   lumbar, Dr Gladstone Lighter  . PACEMAKER IMPLANT N/A 09/10/2017   Procedure: PACEMAKER IMPLANT;  Surgeon: Deboraha Sprang, MD;  Location: [place]  CV LAB;  Service: Cardiovascular;  Laterality: N/A;  . PANENDOSCOPY    . PORTACATH PLACEMENT N/A 03/06/2017   Procedure: INSERTION PORT-A-CATH AND ASPIRATE SEROMA LEFT AXILLA;  Surgeon: Fanny Skates, MD;  Location: [place];  Service: General;  Laterality: N/A;  . RIGHT/LEFT HEART CATH AND CORONARY/GRAFT ANGIOGRAPHY N/A 07/28/2017   Procedure: RIGHT/LEFT HEART CATH AND CORONARY/GRAFT ANGIOGRAPHY;  Surgeon: Sherren Mocha, MD;  Location: [place] CV LAB;  Service: Cardiovascular;  Laterality: N/A;  . SHOULDER SURGERY Right 08/2010   screws placed; "tendons tore off"  . SKIN CANCER EXCISION Right    "neck"  . TONSILLECTOMY  ~ 1954  . TRANSCATHETER AORTIC VALVE REPLACEMENT, TRANSFEMORAL N/A 09/09/2017   Procedure: TRANSCATHETER AORTIC VALVE REPLACEMENT, TRANSFEMORAL;  Surgeon: Burnell Blanks, MD;  Location: [place];  Service: Open Heart Surgery;  Laterality: N/A;  . UPPER GI ENDOSCOPY  2013   Gastritis; Dr Carlean Purl  . VASECTOMY      Family History  Problem Relation Age of Onset  . Stroke Father   . Hypertension Father   . Pancreatic cancer Mother   . Diabetes Maternal Grandmother   . Stroke Maternal Grandmother   . Heart attack Paternal Grandmother   . Colon cancer Neg Hx   . Esophageal cancer Neg Hx   . Rectal cancer Neg Hx   . Stomach cancer Neg Hx   . Ulcers Neg Hx     Social History   Socioeconomic History  . Marital status: Divorced    Spouse name: Not on file  . Number of children: 2  . Years of education: Not on file  . Highest education level: Not on file  Occupational History    Employer: RETIRED  Social Needs  . Financial resource strain: Not on file  . Food insecurity:    Worry: Not on file    Inability: Not on file  . Transportation needs:    Medical: Not on file    Non-medical: Not on file  Tobacco Use  . Smoking status: Never Smoker  . Smokeless tobacco: Never Used  Substance and Sexual Activity  . Alcohol use: No    Alcohol/week: 0.0 oz     Comment: "last drink was in 2012"( 03/15/2014)  . Drug use: No  . Sexual activity: Not Currently  Lifestyle  . Physical activity:    Days per week: Not on file    Minutes per session: Not on file  . Stress: Not on file  Relationships  . Social connections:    Talks on phone: Not on file    Gets together: Not on file    Attends religious service: Not on file    Active member of club or organization: Not on file    Attends meetings of clubs or organizations: Not on file    Relationship status: Not on file  . Intimate partner violence:    Fear of current or ex partner: Not on file    Emotionally abused:  Not on file    Physically abused: Not on file    Forced sexual activity: Not on file  Other Topics Concern  . Not on file  Social History Narrative   Divorced, lives with a roommate.   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    Outpatient Medications Prior to Visit  Medication Sig Dispense Refill  . allopurinol (ZYLOPRIM) 300 MG tablet Take 450 mg by mouth daily.     [name] amLODipine (NORVASC) 5 MG tablet TAKE 2 TABLETS (10 MG TOTAL) BY MOUTH DAILY. (Patient taking differently: Take 1 tablet BID) 60 tablet 11  . apixaban (ELIQUIS) 5 MG TABS tablet Take 1 tablet (5 mg total) by mouth 2 (two) times daily. 60 tablet 1  . aspirin 81 MG tablet Take 81 mg by mouth daily.    [name] atorvastatin (LIPITOR) 80 MG tablet TAKE ONE TABLET BY MOUTH AT BEDTIME 90 tablet 3  . esomeprazole (NEXIUM) 40 MG capsule Take 1 capsule (40 mg total) by mouth daily. 30 capsule 5  . ezetimibe (ZETIA) 10 MG tablet TAKE ONE TABLET BY MOUTH DAILY 90 tablet 3  . famciclovir (FAMVIR) 500 MG tablet Take 1 tablet (500 mg total) by mouth daily. 30 tablet 12  . fluticasone (FLONASE) 50 MCG/ACT nasal spray INHALE ONE SPRAY INTO EACH NOSTRIL TWICE DAILY.  16 g 6  . furosemide (LASIX) 40 MG tablet Take 120 mg by mouth 2 (two) times daily.    [name] glimepiride (AMARYL) 2 MG tablet TAKE 1 TABLET (2 MG TOTAL) BY MOUTH DAILY WITH BREAKFAST. 90 tablet 0  . glucose blood (FREESTYLE LITE) test strip TEST BLOOD SUGAR bid 100 each 11  . Lancets (FREESTYLE) lancets Use BID to check blood sugar.  DX E11.9 100 each 6  . lipase/protease/amylase (CREON) 36000 UNITS CPEP capsule Take 2 capsules (72,000 Units total) by mouth 3 (three) times daily with meals. 1 capsule with snack. 210 capsule 2  . METFORMIN HCL PO Take 40 mg by mouth 2 (two) times daily with a meal. Pt takes 1.5 tablets in the AM and 1 tablet in the PM    . nitroGLYCERIN (NITROSTAT) 0.4 MG SL tablet Place 1 tablet (0.4 mg total) under the tongue every 5 (five) minutes as needed for chest pain. 25 tablet 3  . polycarbophil (FIBERCON) 625 MG tablet Take 625 mg by mouth daily.    . potassium chloride SA (K-DUR,KLOR-CON) 20 MEQ tablet Take 40 mEq by mouth 2 (two) times daily.    . pregabalin (LYRICA) 75 MG capsule Take 1 capsule (75 mg total) by mouth 3 (three) times daily. 270 capsule 3  . psyllium (METAMUCIL) 58.6 % powder Take 1 packet by mouth 3 (three) times daily.     Facility-Administered Medications Prior to Visit  Medication Dose Route Frequency Provider Last Rate Last Dose  . sodium chloride flush (NS) 0.9 % injection 10 mL  10 mL Intravenous PRN [name], MD   10 mL at 05/30/17 0920    Allergies  Allergen Reactions  . Benazepril Swelling    angioedema; he is not a candidate for any angiotensin receptor blockers because of this significant allergic reaction. Because of a history of documented adverse serious drug reaction;Medi Alert bracelet  is recommended  . Hctz [Hydrochlorothiazide] Anaphylaxis and Swelling    Tongue  and lip swelling   . Aspirin Other (See Comments)    Gastritis, cant take 325 Mg aspirin   . Lactose Intolerance (Gi) Nausea And Vomiting    ROS       Objective:    Physical Exam  Constitutional: He is oriented to person, place, and time. Vital signs are normal. He appears well-developed and well-nourished. He is sleeping.  HENT:  Head: Normocephalic and atraumatic.  Mouth/Throat: Oropharynx is clear and moist.  Eyes: Pupils are equal, round, and reactive to light. EOM are normal.  Neck: Normal range of motion. Neck supple. No thyromegaly present.  Cardiovascular: Normal rate and regular rhythm.  No murmur heard. Pulmonary/Chest: Effort normal and breath sounds normal. No respiratory distress. He has no wheezes. He has no rales. He exhibits no tenderness.  Musculoskeletal: He exhibits no edema or tenderness.  Neurological: He is alert and oriented to person, place, and time.  Skin: Skin is warm and dry. Ecchymosis and laceration noted.     Psychiatric: He has a normal mood and affect. His behavior is normal. Judgment and thought content normal.  Nursing note and vitals reviewed.   BP 126/60 (BP Location: Left Arm, Patient Position: Sitting, Cuff Size: Large)   Pulse 67   Temp 97.6 F (36.4 C) (Oral)   Resp 14   Ht 6' (1.829 m)   Wt 239 lb 9.6 oz (108.7 kg)   SpO2 97%   BMI 32.50 kg/m  Wt Readings from Last 3 Encounters:  09/29/17 239 lb 9.6 oz (108.7 kg)  09/18/17 231 lb 9.6 oz (105.1 kg)  09/12/17 240 lb 8.4 oz (109.1 kg)   BP Readings from Last 3 Encounters:  09/29/17 126/60  09/18/17 112/64  09/12/17 125/72     Immunization History  Administered Date(s) Administered  . Influenza Split 06/12/2011, 05/31/2013  . Influenza Whole 05/01/2012  . Influenza, High Dose Seasonal PF 02/09/2015, 03/12/2017  . Influenza-Unspecified 02/11/2014, 02/29/2016  . Pneumococcal Conjugate-13 09/18/2015  . Pneumococcal Polysaccharide-23 06/20/2013  . Td 03/20/2010, 03/16/2013, 09/29/2017  . Zoster Recombinat (Shingrix) 11/21/2016    Health Maintenance  Topic Date Due  . URINE MICROALBUMIN  03/15/2017  . OPHTHALMOLOGY EXAM   10/16/2017  . FOOT EXAM  11/21/2017  . INFLUENZA VACCINE  01/29/2018  . COLONOSCOPY  02/19/2018  . HEMOGLOBIN A1C  03/08/2018  . TETANUS/TDAP  03/17/2023  . PNA vac Low Risk Adult  Completed    Lab Results  Component Value Date   WBC 2.8 (L) 09/18/2017   HGB 11.5 (L) 09/18/2017   HCT 36.7 (L) 09/18/2017   PLT 69 (LL) 09/18/2017   GLUCOSE 108 (H) 09/12/2017   CHOL 111 08/05/2017   TRIG 218.0 (H) 08/05/2017   HDL 27.30 (L) 08/05/2017   LDLDIRECT 51.0 08/05/2017   LDLCALC 57 11/21/2016   ALT 19 09/05/2017   AST 24 09/05/2017   NA 138 09/12/2017   K 3.7 09/12/2017   CL 104 09/12/2017   CREATININE 0.87 09/12/2017   BUN 11 09/12/2017   CO2 24 09/12/2017   TSH 2.101 08/18/2017   PSA 0.65 09/27/2013   INR 1.45 09/09/2017   HGBA1C 5.4 09/05/2017   MICROALBUR 0.4 03/15/2016    Lab Results  Component Value Date   TSH 2.101 08/18/2017   Lab Results  Component Value Date   WBC 2.8 (L) 09/18/2017   HGB 11.5 (L) 09/18/2017   HCT 36.7 (L) 09/18/2017   MCV 90 09/18/2017   PLT 69 (LL) 09/18/2017   Lab Results  Component Value Date   NA 138 09/12/2017   K 3.7 09/12/2017   CO2 24 09/12/2017   GLUCOSE 108 (H) 09/12/2017   BUN 11 09/12/2017   CREATININE 0.87 09/12/2017   BILITOT 1.0 09/05/2017   ALKPHOS 106 (H) 09/05/2017   AST 24 09/05/2017   ALT 19 09/05/2017   PROT 6.4 09/05/2017   ALBUMIN 3.5 09/05/2017   CALCIUM 8.1 (L) 09/12/2017   ANIONGAP 10 09/12/2017   GFR 88.78 08/05/2017   Lab Results  Component Value Date   CHOL 111 08/05/2017   Lab Results  Component Value Date   HDL 27.30 (L) 08/05/2017   Lab Results  Component Value Date   LDLCALC 57 11/21/2016   Lab Results  Component Value Date   TRIG 218.0 (H) 08/05/2017   Lab Results  Component Value Date   CHOLHDL 4 08/05/2017   Lab Results  Component Value Date   HGBA1C 5.4 09/05/2017         Assessment & Plan:   Problem List Items Addressed This Visit    None    Visit Diagnoses     Laceration of left hand without foreign body, initial encounter    -  Primary   Relevant Medications   cephALEXin (KEFLEX) 500 MG capsule   tetanus & diphtheria toxoids (adult) (TENIVAC) injection 0.5 mL (Completed)    steri strips put on laceration  Guaze and paper tape  Pt put on abx  Td given  rto prn   I am having Richard [name] start on cephALEXin. I am also having him maintain his allopurinol, esomeprazole, aspirin, nitroGLYCERIN, ezetimibe, amLODipine, atorvastatin, polycarbophil, famciclovir, freestyle, glucose blood, pregabalin, fluticasone, lipase/protease/amylase, glimepiride, furosemide, potassium chloride SA, METFORMIN HCL PO, psyllium, and apixaban. We administered tetanus & diphtheria toxoids (adult).  Meds ordered this encounter  Medications  . cephALEXin (KEFLEX) 500 MG capsule    Sig: Take 1 capsule (500 mg total) by mouth 2 (two) times daily.    Dispense:  20 capsule    Refill:  0  . tetanus & diphtheria toxoids (adult) (TENIVAC) injection 0.5 mL    CMA served as scribe during this visit. History, Physical and Plan performed by medical provider. Documentation and orders reviewed and attested to.  [name], DO

## 2017-09-30 ENCOUNTER — Telehealth: Payer: Self-pay | Admitting: *Deleted

## 2017-09-30 NOTE — Telephone Encounter (Signed)
Advised patient that Dr. Etter Sjogren had some samples for Eliquis ready for pickup and to also call the circled number on the paper inside bag.

## 2017-10-06 ENCOUNTER — Inpatient Hospital Stay: Payer: Medicare Other | Attending: Hematology & Oncology

## 2017-10-06 ENCOUNTER — Other Ambulatory Visit: Payer: Self-pay | Admitting: Family Medicine

## 2017-10-06 VITALS — BP 129/81 | HR 71 | Temp 97.4°F | Resp 16

## 2017-10-06 DIAGNOSIS — D510 Vitamin B12 deficiency anemia due to intrinsic factor deficiency: Secondary | ICD-10-CM | POA: Diagnosis not present

## 2017-10-06 DIAGNOSIS — D508 Other iron deficiency anemias: Secondary | ICD-10-CM

## 2017-10-06 DIAGNOSIS — D519 Vitamin B12 deficiency anemia, unspecified: Secondary | ICD-10-CM

## 2017-10-06 DIAGNOSIS — E1140 Type 2 diabetes mellitus with diabetic neuropathy, unspecified: Secondary | ICD-10-CM

## 2017-10-06 MED ORDER — CYANOCOBALAMIN 1000 MCG/ML IJ SOLN
INTRAMUSCULAR | Status: AC
  Filled 2017-10-06: qty 1

## 2017-10-06 MED ORDER — CYANOCOBALAMIN 1000 MCG/ML IJ SOLN
1000.0000 ug | Freq: Once | INTRAMUSCULAR | Status: AC
  Administered 2017-10-06: 1000 ug via INTRAMUSCULAR

## 2017-10-06 NOTE — Patient Instructions (Signed)
Cyanocobalamin, Vitamin B12 injection What is this medicine? CYANOCOBALAMIN (sye an oh koe BAL a min) is a man made form of vitamin B12. Vitamin B12 is used in the growth of healthy blood cells, nerve cells, and proteins in the body. It also helps with the metabolism of fats and carbohydrates. This medicine is used to treat people who can not absorb vitamin B12. This medicine may be used for other purposes; ask your health care provider or pharmacist if you have questions. COMMON BRAND NAME(S): B-12 Compliance Kit, B-12 Injection Kit, Cyomin, LA-12, Nutri-Twelve, Physicians EZ Use B-12, Primabalt What should I tell my health care provider before I take this medicine? They need to know if you have any of these conditions: -kidney disease -Leber's disease -megaloblastic anemia -an unusual or allergic reaction to cyanocobalamin, cobalt, other medicines, foods, dyes, or preservatives -pregnant or trying to get pregnant -breast-feeding How should I use this medicine? This medicine is injected into a muscle or deeply under the skin. It is usually given by a health care professional in a clinic or doctor's office. However, your doctor may teach you how to inject yourself. Follow all instructions. Talk to your pediatrician regarding the use of this medicine in children. Special care may be needed. Overdosage: If you think you have taken too much of this medicine contact a poison control center or emergency room at once. NOTE: This medicine is only for you. Do not share this medicine with others. What if I miss a dose? If you are given your dose at a clinic or doctor's office, call to reschedule your appointment. If you give your own injections and you miss a dose, take it as soon as you can. If it is almost time for your next dose, take only that dose. Do not take double or extra doses. What may interact with this medicine? -colchicine -heavy alcohol intake This list may not describe all possible  interactions. Give your health care provider a list of all the medicines, herbs, non-prescription drugs, or dietary supplements you use. Also tell them if you smoke, drink alcohol, or use illegal drugs. Some items may interact with your medicine. What should I watch for while using this medicine? Visit your doctor or health care professional regularly. You may need blood work done while you are taking this medicine. You may need to follow a special diet. Talk to your doctor. Limit your alcohol intake and avoid smoking to get the best benefit. What side effects may I notice from receiving this medicine? Side effects that you should report to your doctor or health care professional as soon as possible: -allergic reactions like skin rash, itching or hives, swelling of the face, lips, or tongue -blue tint to skin -chest tightness, pain -difficulty breathing, wheezing -dizziness -red, swollen painful area on the leg Side effects that usually do not require medical attention (report to your doctor or health care professional if they continue or are bothersome): -diarrhea -headache This list may not describe all possible side effects. Call your doctor for medical advice about side effects. You may report side effects to FDA at 1-800-FDA-1088. Where should I keep my medicine? Keep out of the reach of children. Store at room temperature between 15 and 30 degrees C (59 and 85 degrees F). Protect from light. Throw away any unused medicine after the expiration date. NOTE: This sheet is a summary. It may not cover all possible information. If you have questions about this medicine, talk to your doctor, pharmacist, or   health care provider.  2018 Elsevier/Gold Standard (2007-09-28 22:10:20)  

## 2017-10-07 NOTE — Progress Notes (Addendum)
Valve Clinic Note  Chief Complaint  Patient presents with  . Follow-up    One month post TAVR   History of Present Illness:77 yo male with history of CAD s/p CABG, GERD, HTN, HLD, DM2, obesity, sleep apnea, chronic diastolic CHF, diffuse large B cell lymphoma, paroxysmal atrial fib/flutter, complete heart block s/p PPM and severe aortic stenosis who is now s/p TAVR and here today for one month TAVR follow up. His CABG was in 2001. He was diagnosed with lymphoma in August 2018 and was undergoing chemotherapy when admitted in January 2019 with chest pain and dyspnea. Cardiac cath 07/28/17 with 4/5 patent bypass grafts. He was diuresed and discharged home. We allowed him to finish his chemotherapy. He underwent TAVR on 09/09/17 with placement of a 26 mm Edwards Sapien 3 valve from the right transfemoral approach. Following his valve replacement, he developed complete heart block and had a permanent pacemaker placed on 09/10/17. There was left arm swelling following his pacemaker placement. Venous dopplers negative for DVT of the left arm. He was diuresed during this admission. Atrial fib/flutter noted. Eliquis was started. He was discharged on ASA and Eliquis.   He is here today for one month TAVR follow up. He is feeling great. No chest pain. He has seen a big improvement in his dyspnea. No LE edema. His left arm swelling has resolved.   Primary Care Physician: Carollee Herter, Alferd Apa, DO Primary Cardiologist: Percival Spanish  Past Medical History:  Diagnosis Date  . Anemia   . Axillary adenopathy 02/25/2017  . Bradycardia    a. holter monitor has demonstrated HRs in 30s and [place]ink[place]ach   . CAD (coronary artery disease)    a. s/p CABG 2001  . Chronic lower back pain   . Diffuse large B cell lymphoma ([place])   . Diverticulosis   . Esophageal stricture   . GERD (gastroesophageal reflux disease)   . History of gout   . HTN (hypertension)   . Mixed hyperlipidemia   . OSA on CPAP    with 2L O2 at night   . Pancytopenia ([place])    a. related to chemo therapy for B cell lymphoma  . Peptic stricture of esophagus   . Severe aortic stenosis   . Spinal stenosis   . Type II diabetes mellitus ([place])     Past Surgical History:  Procedure Laterality Date  . APPENDECTOMY  ~ 1952  . BACK SURGERY    . CATARACT EXTRACTION W/PHACO Left 11/06/2016   Procedure: CATARACT EXTRACTION PHACO AND INTRAOCULAR LENS PLACEMENT (IOC);  Surgeon: Estill Cotta, MD;  Location: [facility];  Service: Ophthalmology;  Laterality: Left;  Lot # [id number] H [place]: 01:09.4 AP%:25.2 CDE: 30.64  . CATARACT EXTRACTION W/PHACO Right 12/04/2016   Procedure: CATARACT EXTRACTION PHACO AND INTRAOCULAR LENS PLACEMENT (IOC);  Surgeon: Estill Cotta, MD;  Location: [facility];  Service: Ophthalmology;  Laterality: Right;  [place] 1:25.9 AP% 24.1 CDE 39.10 Fluid Pack lot # [id number]  . COLONOSCOPY W/ BIOPSIES AND POLYPECTOMY  2013  . CORONARY ANGIOPLASTY  1993  . CORONARY ANGIOPLASTY WITH STENT PLACEMENT  05/1997   "1"  . CORONARY ARTERY BYPASS GRAFT  03/2000   "CABG X5"  . ESOPHAGEAL DILATION  X 3-4   Dr. Lyla Son; "last one was in the 1990's"  . ESOPHAGOGASTRODUODENOSCOPY     multiple  . FLEXIBLE SIGMOIDOSCOPY     multiple  . HYDRADENITIS EXCISION Left 02/25/2017   Procedure: EXCISION DEEP LEFT AXILLARY LYMPH NODE;  Surgeon: Dalbert Batman,  Renelda Loma, MD;  Location: [place];  Service: General;  Laterality: Left;  . INTRAOPERATIVE TRANSTHORACIC ECHOCARDIOGRAM N/A 09/09/2017   Procedure: INTRAOPERATIVE TRANSTHORACIC ECHOCARDIOGRAM;  Surgeon: Burnell Blanks, MD;  Location: [place];  Service: Open Heart Surgery;  Laterality: N/A;  . KNEE ARTHROSCOPY Left 2011   meniscus repair  . LEFT HEART CATHETERIZATION WITH CORONARY/GRAFT ANGIOGRAM N/A 03/16/2014   Procedure: LEFT HEART CATHETERIZATION WITH Beatrix Fetters;  Surgeon: Burnell Blanks, MD;  Location: [facility] CATH LAB;  Service: Cardiovascular;  Laterality: N/A;  . LUMBAR  LAMINECTOMY/DECOMPRESSION MICRODISCECTOMY Right 06/17/2013   Procedure: LUMBAR LAMINECTOMY MICRODISCECTOMY L4-L5 RIGHT EXCISION OF SYNOVIAL CYST RIGHT   (1 LEVEL) RIGHT PARTIAL FACETECTOMY;  Surgeon: Tobi Bastos, MD;  Location: [facility];  Service: Orthopedics;  Laterality: Right;  . MYELOGRAM  04/06/13   lumbar, Dr Gladstone Lighter  . PACEMAKER IMPLANT N/A 09/10/2017   Procedure: PACEMAKER IMPLANT;  Surgeon: Deboraha Sprang, MD;  Location: [place] CV LAB;  Service: Cardiovascular;  Laterality: N/A;  . PANENDOSCOPY    . PORTACATH PLACEMENT N/A 03/06/2017   Procedure: INSERTION PORT-A-CATH AND ASPIRATE SEROMA LEFT AXILLA;  Surgeon: Fanny Skates, MD;  Location: [place];  Service: General;  Laterality: N/A;  . RIGHT/LEFT HEART CATH AND CORONARY/GRAFT ANGIOGRAPHY N/A 07/28/2017   Procedure: RIGHT/LEFT HEART CATH AND CORONARY/GRAFT ANGIOGRAPHY;  Surgeon: Sherren Mocha, MD;  Location: [place] CV LAB;  Service: Cardiovascular;  Laterality: N/A;  . SHOULDER SURGERY Right 08/2010   screws placed; "tendons tore off"  . SKIN CANCER EXCISION Right    "neck"  . TONSILLECTOMY  ~ 1954  . TRANSCATHETER AORTIC VALVE REPLACEMENT, TRANSFEMORAL N/A 09/09/2017   Procedure: TRANSCATHETER AORTIC VALVE REPLACEMENT, TRANSFEMORAL;  Surgeon: Burnell Blanks, MD;  Location: [place];  Service: Open Heart Surgery;  Laterality: N/A;  . UPPER GI ENDOSCOPY  2013   Gastritis; Dr Carlean Purl  . VASECTOMY      Current Outpatient Medications  Medication Sig Dispense Refill  . allopurinol (ZYLOPRIM) 300 MG tablet Take 450 mg by mouth daily.     [name] amLODipine (NORVASC) 5 MG tablet TAKE 2 TABLETS (10 MG TOTAL) BY MOUTH DAILY. (Patient taking differently: Take 1 tablet BID) 60 tablet 11  . apixaban (ELIQUIS) 5 MG TABS tablet Take 1 tablet (5 mg total) by mouth 2 (two) times daily. 60 tablet 1  . aspirin 81 MG tablet Take 81 mg by mouth daily.    [name] atorvastatin (LIPITOR) 80 MG tablet TAKE ONE TABLET BY MOUTH AT BEDTIME 90 tablet 3   . cephALEXin (KEFLEX) 500 MG capsule Take 1 capsule (500 mg total) by mouth 2 (two) times daily. 20 capsule 0  . esomeprazole (NEXIUM) 40 MG capsule Take 1 capsule (40 mg total) by mouth daily. 30 capsule 5  . ezetimibe (ZETIA) 10 MG tablet TAKE ONE TABLET BY MOUTH DAILY 90 tablet 3  . famciclovir (FAMVIR) 500 MG tablet Take 1 tablet (500 mg total) by mouth daily. 30 tablet 12  . fluticasone (FLONASE) 50 MCG/ACT nasal spray INHALE ONE SPRAY INTO EACH NOSTRIL TWICE DAILY. 16 g 6  . furosemide (LASIX) 40 MG tablet Take 120 mg by mouth 2 (two) times daily.    [name] glimepiride (AMARYL) 2 MG tablet TAKE 1 TABLET (2 MG TOTAL) BY MOUTH DAILY WITH BREAKFAST. 90 tablet 0  . glucose blood (FREESTYLE LITE) test strip TEST BLOOD SUGAR bid 100 each 11  . Lancets (FREESTYLE) lancets Use BID to check blood sugar.  DX E11.9 100 each 6  .  lipase/protease/amylase (CREON) 36000 UNITS CPEP capsule Take 2 capsules (72,000 Units total) by mouth 3 (three) times daily with meals. 1 capsule with snack. 210 capsule 2  . LYRICA 75 MG capsule TAKE 1 CAPSULE BY MOUTH THREE TIMES A DAY 270 capsule 1  . METFORMIN HCL PO Take 40 mg by mouth 2 (two) times daily with a meal. Pt takes 1.5 tablets in the AM and 1 tablet in the PM    . nitroGLYCERIN (NITROSTAT) 0.4 MG SL tablet Place 1 tablet (0.4 mg total) under the tongue every 5 (five) minutes as needed for chest pain. 25 tablet 3  . polycarbophil (FIBERCON) 625 MG tablet Take 625 mg by mouth daily.    . potassium chloride SA (K-DUR,KLOR-CON) 20 MEQ tablet Take 40 mEq by mouth 2 (two) times daily.    . psyllium (METAMUCIL) 58.6 % powder Take 1 packet by mouth 3 (three) times daily.     No current facility-administered medications for this visit.    Facility-Administered Medications Ordered in Other Visits  Medication Dose Route Frequency Provider Last Rate Last Dose  . sodium chloride flush (NS) 0.9 % injection 10 mL  10 mL Intravenous PRN [name], MD   10 mL at 05/30/17  0920    Allergies  Allergen Reactions  . Benazepril Swelling    angioedema; he is not a candidate for any angiotensin receptor blockers because of this significant allergic reaction. Because of a history of documented adverse serious drug reaction;Medi Alert bracelet  is recommended  . Hctz [Hydrochlorothiazide] Anaphylaxis and Swelling    Tongue and lip swelling   . Aspirin Other (See Comments)    Gastritis, cant take 325 Mg aspirin   . Lactose Intolerance (Gi) Nausea And Vomiting    Social History   Socioeconomic History  . Marital status: Divorced    Spouse name: Not on file  . Number of children: 2  . Years of education: Not on file  . Highest education level: Not on file  Occupational History    Employer: RETIRED  Social Needs  . Financial resource strain: Not on file  . Food insecurity:    Worry: Not on file    Inability: Not on file  . Transportation needs:    Medical: Not on file    Non-medical: Not on file  Tobacco Use  . Smoking status: Never Smoker  . Smokeless tobacco: Never Used  Substance and Sexual Activity  . Alcohol use: No    Alcohol/week: 0.0 oz    Comment: "last drink was in 2012"( 03/15/2014)  . Drug use: No  . Sexual activity: Not Currently  Lifestyle  . Physical activity:    Days per week: Not on file    Minutes per session: Not on file  . Stress: Not on file  Relationships  . Social connections:    Talks on phone: Not on file    Gets together: Not on file    Attends religious service: Not on file    Active member of club or organization: Not on file    Attends meetings of clubs or organizations: Not on file    Relationship status: Not on file  . Intimate partner violence:    Fear of current or ex partner: Not on file    Emotionally abused: Not on file    Physically abused: Not on file    Forced sexual activity: Not on file  Other Topics Concern  . Not on file  Social History Narrative  Divorced, lives with a roommate.   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    Family History  Problem Relation Age of Onset  . Stroke Father   . Hypertension Father   . Pancreatic cancer Mother   . Diabetes Maternal Grandmother   . Stroke Maternal Grandmother   . Heart attack Paternal Grandmother   . Colon cancer Neg Hx   . Esophageal cancer Neg Hx   . Rectal cancer Neg Hx   . Stomach cancer Neg Hx   . Ulcers Neg Hx     Review of Systems:  As stated in the HPI and otherwise negative.   BP (!) 144/70   Pulse 87   Ht 6' (1.829 m)   Wt 241 lb (109.3 kg)   SpO2 94%   BMI 32.69 kg/m   Physical Examination: General: Well developed, well nourished, NAD  HEENT: OP clear, mucus membranes moist  SKIN: warm, dry. No rashes. Neuro: No focal deficits  Musculoskeletal: Muscle strength 5/5 all ext  Psychiatric: Mood and affect normal  Neck: No JVD, no carotid bruits, no thyromegaly, no lymphadenopathy.  Lungs:Clear bilaterally, no wheezes, rhonci, crackles Cardiovascular: Regular rate and rhythm. Soft systolic murmur.  Abdomen:Soft. Bowel sounds present. Non-tender.  Extremities: No lower extremity edema. Pulses are 2 + in the bilateral DP/PT.  10/08/17: Left ventricle: The cavity size was normal. There was mild   concentric hypertrophy. Systolic function was normal. The   estimated ejection fraction was 50%. Doppler parameters are   consistent with diastolic dysfunction. LV Filling pressure is   indeterminate. Ejection fraction (MOD, 2-plane): 49%. - Aortic valve: s/p TAVR - no obstruction or paravalvular leak.   Mean gradient (S): 18 mm Hg. Peak gradient (S): 34 mm Hg. Valve   area (VTI): 1.24 cm^2. Valve area (Vmax): 1.3 cm^2. Valve area   (Vmean): 1.2 cm^2. - Aorta: Ascending aortic diameter: 45 mm (S). - Ascending aorta: The ascending aorta  is moderately dilated. - Mitral valve: Mildly thickened leaflets . There was mild   regurgitation. Valve area by pressure half-time: 2.27 cm^2. Valve   area by continuity equation (using LVOT flow): 1.58 cm^2. - Left atrium: The atrium was mildly dilated. - Right ventricle: The cavity size was moderately dilated. Pacer   wire or catheter noted in right ventricle. - Right atrium: Moderately dilated. Pacer wire or catheter noted in   right atrium. - Tricuspid valve: There was mild regurgitation. - Pulmonary arteries: PA peak pressure: 45 mm Hg (S). - Inferior vena cava: The vessel was dilated. The respirophasic   diameter changes were blunted (< 50%), consistent with elevated   central venous pressure.  Impressions:  - Compared to a prior study in 08/2017, the LVEF is lower at 50%.   The TAVR valve is stable. The aorta is dilated to 4.5 cm.   EKG:  No EKG today  Recent Labs: 08/18/2017: TSH 2.101 09/05/2017: ALT 19; B Natriuretic Peptide 1,104.8 09/10/2017: Magnesium 1.8 09/12/2017: BUN 11; Creatinine, Ser 0.87; Potassium 3.7; Sodium 138 09/18/2017: Hemoglobin 11.5; Platelets 69    Wt Readings from Last 3 Encounters:  10/08/17 241 lb (109.3 kg)  09/29/17 239 lb 9.6 oz (108.7 kg)  09/18/17 231 lb 9.6 oz (105.1 kg)     Other studies Reviewed: Additional studies/ records that were reviewed today  include: . Review of the above records demonstrates:    Assessment and Plan:   1. Severe aortic valve stenosis: He is now one month post TAVR with placement of a 26 mm Edwards Sapien 3 valve from the right transfemoral approach. The procedure was complicated by complete heart block. A permanent pacemaker is now in place. He is doing well. He is NYHA class 1. Echo today shows the bioprosthetic valve is working well. The  Mean gradient was 14 mmHg post implant and now 18 mmHg. Official echo report to follow. Will continue ASA and Eliquis. He will continue to use antibiotic prophylaxis as  indicated.   Curent medicines are reviewed at length with the patient today.  The patient does not have concerns regarding medicines.  The following changes have been made:  no change  Labs/ tests ordered today include:  No orders of the defined types were placed in this encounter.    Disposition:   Follow up in one year in the valve clinic. He will need follow up in 6 months with Dr. Percival Spanish.    Signed, Lauree Chandler, MD 10/08/2017 10:41 AM    [facility] [address], [place], [place]  [address] Phone: [phone]; Fax: [phone]

## 2017-10-07 NOTE — Telephone Encounter (Signed)
[facility] [address] requesting refill for lyrica  Database ran and is on your desk for review.  Last filled per database:  06/30/17 Last written: 04/03/17 Last ov: 09/29/17 Next ov: none Contract: none UDS: none

## 2017-10-08 ENCOUNTER — Ambulatory Visit (HOSPITAL_COMMUNITY): Payer: Medicare Other | Attending: Internal Medicine

## 2017-10-08 ENCOUNTER — Other Ambulatory Visit: Payer: Self-pay

## 2017-10-08 ENCOUNTER — Encounter: Payer: Self-pay | Admitting: Cardiovascular Disease

## 2017-10-08 ENCOUNTER — Ambulatory Visit (INDEPENDENT_AMBULATORY_CARE_PROVIDER_SITE_OTHER): Payer: Medicare Other | Admitting: Cardiovascular Disease

## 2017-10-08 VITALS — BP 144/70 | HR 87 | Ht 72.0 in | Wt 241.0 lb

## 2017-10-08 DIAGNOSIS — Z8572 Personal history of non-Hodgkin lymphomas: Secondary | ICD-10-CM | POA: Diagnosis not present

## 2017-10-08 DIAGNOSIS — Z953 Presence of xenogenic heart valve: Secondary | ICD-10-CM

## 2017-10-08 DIAGNOSIS — I350 Nonrheumatic aortic (valve) stenosis: Secondary | ICD-10-CM

## 2017-10-08 DIAGNOSIS — Z8249 Family history of ischemic heart disease and other diseases of the circulatory system: Secondary | ICD-10-CM | POA: Insufficient documentation

## 2017-10-08 DIAGNOSIS — I2510 Atherosclerotic heart disease of native coronary artery without angina pectoris: Secondary | ICD-10-CM | POA: Insufficient documentation

## 2017-10-08 DIAGNOSIS — I119 Hypertensive heart disease without heart failure: Secondary | ICD-10-CM | POA: Insufficient documentation

## 2017-10-08 DIAGNOSIS — Z9221 Personal history of antineoplastic chemotherapy: Secondary | ICD-10-CM | POA: Diagnosis not present

## 2017-10-08 DIAGNOSIS — I77810 Thoracic aortic ectasia: Secondary | ICD-10-CM | POA: Diagnosis not present

## 2017-10-08 DIAGNOSIS — Z952 Presence of prosthetic heart valve: Secondary | ICD-10-CM | POA: Diagnosis not present

## 2017-10-08 DIAGNOSIS — E119 Type 2 diabetes mellitus without complications: Secondary | ICD-10-CM | POA: Insufficient documentation

## 2017-10-08 DIAGNOSIS — Z950 Presence of cardiac pacemaker: Secondary | ICD-10-CM | POA: Insufficient documentation

## 2017-10-08 DIAGNOSIS — Z951 Presence of aortocoronary bypass graft: Secondary | ICD-10-CM | POA: Insufficient documentation

## 2017-10-08 DIAGNOSIS — I083 Combined rheumatic disorders of mitral, aortic and tricuspid valves: Secondary | ICD-10-CM | POA: Insufficient documentation

## 2017-10-08 DIAGNOSIS — E785 Hyperlipidemia, unspecified: Secondary | ICD-10-CM | POA: Diagnosis not present

## 2017-10-08 DIAGNOSIS — I248 Other forms of acute ischemic heart disease: Secondary | ICD-10-CM | POA: Diagnosis not present

## 2017-10-08 NOTE — Patient Instructions (Signed)
Medication Instructions:  Your physician recommends that you continue on your current medications as directed. Please refer to the Current Medication list given to you today.   Labwork: none  Testing/Procedures: Your physician has requested that you have an echocardiogram. Echocardiography is a painless test that uses sound waves to create images of your heart. It provides your doctor with information about the size and shape of your heart and how well your heart's chambers and valves are working. This procedure takes approximately one hour. There are no restrictions for this procedure.  To be done in 11 months on day of appointment with K. Grandville Silos, [place].  We will call you to schedule this  Follow-Up: Your physician recommends that you schedule a follow-up appointment in: 11 months with K. Grandville Silos, [place]  Follow up with Dr. Percival Spanish as planned    Any Other Special Instructions Will Be Listed Below (If Applicable).     If you need a refill on your cardiac medications before your next appointment, please call your pharmacy.

## 2017-11-07 ENCOUNTER — Encounter: Payer: Self-pay | Admitting: Hematology & Oncology

## 2017-11-07 ENCOUNTER — Inpatient Hospital Stay: Payer: Medicare Other

## 2017-11-07 ENCOUNTER — Telehealth: Payer: Self-pay | Admitting: *Deleted

## 2017-11-07 ENCOUNTER — Other Ambulatory Visit: Payer: Self-pay

## 2017-11-07 ENCOUNTER — Inpatient Hospital Stay: Payer: Medicare Other | Attending: Hematology & Oncology | Admitting: Hematology & Oncology

## 2017-11-07 VITALS — BP 106/51 | HR 62 | Temp 97.7°F | Resp 18 | Wt 248.0 lb

## 2017-11-07 DIAGNOSIS — D500 Iron deficiency anemia secondary to blood loss (chronic): Secondary | ICD-10-CM

## 2017-11-07 DIAGNOSIS — I248 Other forms of acute ischemic heart disease: Secondary | ICD-10-CM | POA: Insufficient documentation

## 2017-11-07 DIAGNOSIS — D510 Vitamin B12 deficiency anemia due to intrinsic factor deficiency: Secondary | ICD-10-CM | POA: Insufficient documentation

## 2017-11-07 DIAGNOSIS — C8332 Diffuse large B-cell lymphoma, intrathoracic lymph nodes: Secondary | ICD-10-CM

## 2017-11-07 DIAGNOSIS — D508 Other iron deficiency anemias: Secondary | ICD-10-CM

## 2017-11-07 DIAGNOSIS — D519 Vitamin B12 deficiency anemia, unspecified: Secondary | ICD-10-CM

## 2017-11-07 LAB — IRON AND TIBC
Iron: 52 ug/dL (ref 42–163)
Saturation Ratios: 18 % — ABNORMAL LOW (ref 42–163)
TIBC: 296 ug/dL (ref 202–409)
UIBC: 244 ug/dL

## 2017-11-07 LAB — CBC WITH DIFFERENTIAL (CANCER CENTER ONLY)
BASOS ABS: 0.1 10*3/uL (ref 0.0–0.1)
BASOS PCT: 3 %
EOS PCT: 3 %
Eosinophils Absolute: 0.1 10*3/uL (ref 0.0–0.5)
HCT: 34 % — ABNORMAL LOW (ref 38.7–49.9)
Hemoglobin: 10.8 g/dL — ABNORMAL LOW (ref 13.0–17.1)
LYMPHS PCT: 33 %
Lymphs Abs: 1 10*3/uL (ref 0.9–3.3)
MCH: 29.4 pg (ref 28.0–33.4)
MCHC: 31.8 g/dL — ABNORMAL LOW (ref 32.0–35.9)
MCV: 92.6 fL (ref 82.0–98.0)
MONOS PCT: 11 %
Monocytes Absolute: 0.3 10*3/uL (ref 0.1–0.9)
NEUTROS ABS: 1.5 10*3/uL (ref 1.5–6.5)
Neutrophils Relative %: 50 %
PLATELETS: 91 10*3/uL — AB (ref 145–400)
RBC: 3.67 MIL/uL — AB (ref 4.20–5.70)
RDW: 16.7 % — ABNORMAL HIGH (ref 11.1–15.7)
WBC Count: 3 10*3/uL — ABNORMAL LOW (ref 4.0–10.0)

## 2017-11-07 LAB — CMP (CANCER CENTER ONLY)
ALBUMIN: 3.8 g/dL (ref 3.5–5.0)
ALT: 24 U/L (ref 10–47)
AST: 31 U/L (ref 11–38)
Alkaline Phosphatase: 111 U/L — ABNORMAL HIGH (ref 26–84)
Anion gap: 9 (ref 5–15)
BUN: 19 mg/dL (ref 7–22)
CALCIUM: 9.1 mg/dL (ref 8.0–10.3)
CO2: 28 mmol/L (ref 18–33)
CREATININE: 1 mg/dL (ref 0.60–1.20)
Chloride: 102 mmol/L (ref 98–108)
GLUCOSE: 111 mg/dL (ref 73–118)
Potassium: 3.5 mmol/L (ref 3.3–4.7)
Sodium: 139 mmol/L (ref 128–145)
TOTAL PROTEIN: 6.5 g/dL (ref 6.4–8.1)
Total Bilirubin: 1 mg/dL (ref 0.2–1.6)

## 2017-11-07 LAB — VITAMIN B12: VITAMIN B 12: 216 pg/mL (ref 180–914)

## 2017-11-07 LAB — FERRITIN: Ferritin: 637 ng/mL — ABNORMAL HIGH (ref 22–316)

## 2017-11-07 MED ORDER — CYANOCOBALAMIN 1000 MCG/ML IJ SOLN
INTRAMUSCULAR | Status: AC
  Filled 2017-11-07: qty 1

## 2017-11-07 MED ORDER — SODIUM CHLORIDE 0.9% FLUSH
10.0000 mL | INTRAVENOUS | Status: DC | PRN
  Administered 2017-11-07: 10 mL via INTRAVENOUS
  Filled 2017-11-07: qty 10

## 2017-11-07 MED ORDER — HEPARIN SOD (PORK) LOCK FLUSH 100 UNIT/ML IV SOLN
500.0000 [IU] | Freq: Once | INTRAVENOUS | Status: AC
  Administered 2017-11-07: 500 [IU] via INTRAVENOUS
  Filled 2017-11-07: qty 5

## 2017-11-07 MED ORDER — CYANOCOBALAMIN 1000 MCG/ML IJ SOLN
1000.0000 ug | Freq: Once | INTRAMUSCULAR | Status: AC
  Administered 2017-11-07: 1000 ug via INTRAMUSCULAR

## 2017-11-07 MED ORDER — DENOSUMAB 120 MG/1.7ML ~~LOC~~ SOLN
120.0000 mg | Freq: Once | SUBCUTANEOUS | Status: AC
  Administered 2017-11-07: 120 mg via SUBCUTANEOUS

## 2017-11-07 MED ORDER — DENOSUMAB 120 MG/1.7ML ~~LOC~~ SOLN
SUBCUTANEOUS | Status: AC
  Filled 2017-11-07: qty 1.7

## 2017-11-07 NOTE — Patient Instructions (Signed)
Implanted Port Home Guide An implanted port is a type of central line that is placed under the skin. Central lines are used to provide IV access when treatment or nutrition needs to be given through a person's veins. Implanted ports are used for long-term IV access. An implanted port may be placed because:  You need IV medicine that would be irritating to the small veins in your hands or arms.  You need long-term IV medicines, such as antibiotics.  You need IV nutrition for a long period.  You need frequent blood draws for lab tests.  You need dialysis.  Implanted ports are usually placed in the chest area, but they can also be placed in the upper arm, the abdomen, or the leg. An implanted port has two main parts:  Reservoir. The reservoir is round and will appear as a small, raised area under your skin. The reservoir is the part where a needle is inserted to give medicines or draw blood.  Catheter. The catheter is a thin, flexible tube that extends from the reservoir. The catheter is placed into a large vein. Medicine that is inserted into the reservoir goes into the catheter and then into the vein.  How will I care for my incision site? Do not get the incision site wet. Bathe or shower as directed by your health care provider. How is my port accessed? Special steps must be taken to access the port:  Before the port is accessed, a numbing cream can be placed on the skin. This helps numb the skin over the port site.  Your health care provider uses a sterile technique to access the port. ? Your health care provider must put on a mask and sterile gloves. ? The skin over your port is cleaned carefully with an antiseptic and allowed to dry. ? The port is gently pinched between sterile gloves, and a needle is inserted into the port.  Only "non-coring" port needles should be used to access the port. Once the port is accessed, a blood return should be checked. This helps ensure that the port  is in the vein and is not clogged.  If your port needs to remain accessed for a constant infusion, a clear (transparent) bandage will be placed over the needle site. The bandage and needle will need to be changed every week, or as directed by your health care provider.  Keep the bandage covering the needle clean and dry. Do not get it wet. Follow your health care provider's instructions on how to take a shower or bath while the port is accessed.  If your port does not need to stay accessed, no bandage is needed over the port.  What is flushing? Flushing helps keep the port from getting clogged. Follow your health care provider's instructions on how and when to flush the port. Ports are usually flushed with saline solution or a medicine called heparin. The need for flushing will depend on how the port is used.  If the port is used for intermittent medicines or blood draws, the port will need to be flushed: ? After medicines have been given. ? After blood has been drawn. ? As part of routine maintenance.  If a constant infusion is running, the port may not need to be flushed.  How long will my port stay implanted? The port can stay in for as long as your health care provider thinks it is needed. When it is time for the port to come out, surgery will be   done to remove it. The procedure is similar to the one performed when the port was put in. When should I seek immediate medical care? When you have an implanted port, you should seek immediate medical care if:  You notice a bad smell coming from the incision site.  You have swelling, redness, or drainage at the incision site.  You have more swelling or pain at the port site or the surrounding area.  You have a fever that is not controlled with medicine.  This information is not intended to replace advice given to you by your health care provider. Make sure you discuss any questions you have with your health care provider. Document  Released: 06/17/2005 Document Revised: 11/23/2015 Document Reviewed: 02/22/2013 Elsevier Interactive Patient Education  2017 [employer]vier [employer].  

## 2017-11-07 NOTE — Telephone Encounter (Addendum)
-----   Message from [name], MD sent at 11/07/2017  1:54 PM EDT ----- Called patient to let him know his iron is a little low!!  1 dose of Feraheme is needed per Dr. Marin Olp.  Appt made for Thursday 5/16

## 2017-11-07 NOTE — Patient Instructions (Signed)
Cyanocobalamin, Vitamin B12 injection What is this medicine? CYANOCOBALAMIN (sye an oh koe BAL a min) is a man made form of vitamin B12. Vitamin B12 is used in the growth of healthy blood cells, nerve cells, and proteins in the body. It also helps with the metabolism of fats and carbohydrates. This medicine is used to treat people who can not absorb vitamin B12. This medicine may be used for other purposes; ask your health care provider or pharmacist if you have questions. COMMON BRAND NAME(S): B-12 Compliance Kit, B-12 Injection Kit, Cyomin, LA-12, Nutri-Twelve, Physicians EZ Use B-12, Primabalt What should I tell my health care provider before I take this medicine? They need to know if you have any of these conditions: -kidney disease -Leber's disease -megaloblastic anemia -an unusual or allergic reaction to cyanocobalamin, cobalt, other medicines, foods, dyes, or preservatives -pregnant or trying to get pregnant -breast-feeding How should I use this medicine? This medicine is injected into a muscle or deeply under the skin. It is usually given by a health care professional in a clinic or doctor's office. However, your doctor may teach you how to inject yourself. Follow all instructions. Talk to your pediatrician regarding the use of this medicine in children. Special care may be needed. Overdosage: If you think you have taken too much of this medicine contact a poison control center or emergency room at once. NOTE: This medicine is only for you. Do not share this medicine with others. What if I miss a dose? If you are given your dose at a clinic or doctor's office, call to reschedule your appointment. If you give your own injections and you miss a dose, take it as soon as you can. If it is almost time for your next dose, take only that dose. Do not take double or extra doses. What may interact with this medicine? -colchicine -heavy alcohol intake This list may not describe all possible  interactions. Give your health care provider a list of all the medicines, herbs, non-prescription drugs, or dietary supplements you use. Also tell them if you smoke, drink alcohol, or use illegal drugs. Some items may interact with your medicine. What should I watch for while using this medicine? Visit your doctor or health care professional regularly. You may need blood work done while you are taking this medicine. You may need to follow a special diet. Talk to your doctor. Limit your alcohol intake and avoid smoking to get the best benefit. What side effects may I notice from receiving this medicine? Side effects that you should report to your doctor or health care professional as soon as possible: -allergic reactions like skin rash, itching or hives, swelling of the face, lips, or tongue -blue tint to skin -chest tightness, pain -difficulty breathing, wheezing -dizziness -red, swollen painful area on the leg Side effects that usually do not require medical attention (report to your doctor or health care professional if they continue or are bothersome): -diarrhea -headache This list may not describe all possible side effects. Call your doctor for medical advice about side effects. You may report side effects to FDA at 1-800-FDA-1088. Where should I keep my medicine? Keep out of the reach of children. Store at room temperature between 15 and 30 degrees C (59 and 85 degrees F). Protect from light. Throw away any unused medicine after the expiration date. NOTE: This sheet is a summary. It may not cover all possible information. If you have questions about this medicine, talk to your doctor, pharmacist, or   health care provider.  2018 Elsevier/Gold Standard (2007-09-28 22:10:20)  

## 2017-11-07 NOTE — Addendum Note (Signed)
Addended by: Perlie Gold on: 11/07/2017 11:06 AM   Modules accepted: Orders

## 2017-11-07 NOTE — Progress Notes (Signed)
Hematology and Oncology Follow Up Visit  Richard [name] [id number] 1940-12-29 77 y.o. 11/07/2017   Principle Diagnosis:  Diffuse large cell non-Hodgkin's lymphoma (IPI = 3) - NOT "double hit" Pernicious anemia  Current Therapy:   R-CHOP - s/p cycle#8 Vitamin B12 1 mg IM every month Xgeva 120 mg subcu q 3 months - next dose in 10/2017   Interim History:  Richard [name] is here today for follow-up.  He looks fantastic.  He feels so well.  I think a lot of this has to do with his aortic valve being repaired.  He is now out of play golf.  He actually is going to [place], [place] for a golf benefit.  He will play 3 different golf courses.  He has had no fever.  He has had no bleeding.  He has had no cough or shortness of breath.  He has had some slight leg swelling but this is chronic.  He has had no fever.  His iron studies done back in February showed a ferritin of 834 with iron saturation of 23%.  .  Overall, his performance status is ECOG 1.   Medications:  Allergies as of 11/07/2017      Reactions   Benazepril Swelling   angioedema; he is not a candidate for any angiotensin receptor blockers because of this significant allergic reaction. Because of a history of documented adverse serious drug reaction;Medi Alert bracelet  is recommended   Hctz [hydrochlorothiazide] Anaphylaxis, Swelling   Tongue and lip swelling   Aspirin Other (See Comments)   Gastritis, cant take 325 Mg aspirin    Lactose Intolerance (gi) Nausea And Vomiting      Medication List        Accurate as of 11/07/17 11:09 AM. Always use your most recent med list.          allopurinol 300 MG tablet Commonly known as:  ZYLOPRIM Take 450 mg by mouth daily.   amLODipine 5 MG tablet Commonly known as:  NORVASC TAKE 2 TABLETS (10 MG TOTAL) BY MOUTH DAILY.   apixaban 5 MG Tabs tablet Commonly known as:  ELIQUIS Take 1 tablet (5 mg total) by mouth 2 (two) times daily.   aspirin 81 MG tablet Take 81 mg by mouth  daily.   atorvastatin 80 MG tablet Commonly known as:  LIPITOR TAKE ONE TABLET BY MOUTH AT BEDTIME   cephALEXin 500 MG capsule Commonly known as:  KEFLEX Take 1 capsule (500 mg total) by mouth 2 (two) times daily.   esomeprazole 40 MG capsule Commonly known as:  NEXIUM Take 1 capsule (40 mg total) by mouth daily.   ezetimibe 10 MG tablet Commonly known as:  ZETIA TAKE ONE TABLET BY MOUTH DAILY   famciclovir 500 MG tablet Commonly known as:  FAMVIR Take 1 tablet (500 mg total) by mouth daily.   fluticasone 50 MCG/ACT nasal spray Commonly known as:  FLONASE INHALE ONE SPRAY INTO EACH NOSTRIL TWICE DAILY.   freestyle lancets Use BID to check blood sugar.  DX E11.9   furosemide 40 MG tablet Commonly known as:  LASIX Take 120 mg by mouth 2 (two) times daily.   glimepiride 2 MG tablet Commonly known as:  AMARYL TAKE 1 TABLET (2 MG TOTAL) BY MOUTH DAILY WITH BREAKFAST.   glucose blood test strip Commonly known as:  FREESTYLE LITE TEST BLOOD SUGAR bid   lipase/protease/amylase 36000 UNITS Cpep capsule Commonly known as:  CREON Take 2 capsules (72,000 Units total) by mouth 3 (  three) times daily with meals. 1 capsule with snack.   LYRICA 75 MG capsule Generic drug:  pregabalin TAKE 1 CAPSULE BY MOUTH THREE TIMES A DAY   METFORMIN HCL PO Take 40 mg by mouth 2 (two) times daily with a meal. Pt takes 1.5 tablets in the AM and 1 tablet in the PM   nitroGLYCERIN 0.4 MG SL tablet Commonly known as:  NITROSTAT Place 1 tablet (0.4 mg total) under the tongue every 5 (five) minutes as needed for chest pain.   polycarbophil 625 MG tablet Commonly known as:  FIBERCON Take 625 mg by mouth daily.   potassium chloride SA 20 MEQ tablet Commonly known as:  K-DUR,KLOR-CON Take 40 mEq by mouth 2 (two) times daily.   psyllium 58.6 % powder Commonly known as:  METAMUCIL Take 1 packet by mouth 3 (three) times daily.       Allergies:  Allergies  Allergen Reactions  .  Benazepril Swelling    angioedema; he is not a candidate for any angiotensin receptor blockers because of this significant allergic reaction. Because of a history of documented adverse serious drug reaction;Medi Alert bracelet  is recommended  . Hctz [Hydrochlorothiazide] Anaphylaxis and Swelling    Tongue and lip swelling   . Aspirin Other (See Comments)    Gastritis, cant take 325 Mg aspirin   . Lactose Intolerance (Gi) Nausea And Vomiting    Past Medical History, Surgical history, Social history, and Family History were reviewed and updated.  Review of Systems: Review of Systems  Constitutional: Negative.   HENT: Positive for congestion.   Eyes: Negative.   Respiratory: Negative.   Cardiovascular: Negative.   Gastrointestinal: Negative.   Genitourinary: Negative.   Musculoskeletal: Negative.   Skin: Negative.   Neurological: Negative.   Endo/Heme/Allergies: Negative.      Physical Exam:  weight is 248 lb (112.5 kg). His oral temperature is 97.7 F (36.5 C). His blood pressure is 106/51 (abnormal) and his pulse is 62. His respiration is 18 and oxygen saturation is 97%.   Wt Readings from Last 3 Encounters:  11/07/17 248 lb (112.5 kg)  10/08/17 241 lb (109.3 kg)  09/29/17 239 lb 9.6 oz (108.7 kg)    Physical Exam  Constitutional: He is oriented to person, place, and time.  HENT:  Head: Normocephalic and atraumatic.  Mouth/Throat: Oropharynx is clear and moist.  Eyes: Pupils are equal, round, and reactive to light. EOM are normal.  Neck: Normal range of motion.  Cardiovascular: Normal rate, regular rhythm and normal heart sounds.  Pulmonary/Chest: Effort normal and breath sounds normal.  Abdominal: Soft. Bowel sounds are normal.  Musculoskeletal: Normal range of motion. He exhibits no edema, tenderness or deformity.  Lymphadenopathy:    He has no cervical adenopathy.  Neurological: He is alert and oriented to person, place, and time.  Skin: Skin is warm and dry.  No rash noted. No erythema.  Psychiatric: He has a normal mood and affect. His behavior is normal. Judgment and thought content normal.  Vitals reviewed.   Lab Results  Component Value Date   WBC 3.0 (L) 11/07/2017   HGB 10.8 (L) 11/07/2017   HCT 34.0 (L) 11/07/2017   MCV 92.6 11/07/2017   PLT 91 (L) 11/07/2017   Lab Results  Component Value Date   FERRITIN 834 (H) 09/10/2017   IRON 64 09/10/2017   TIBC 284 09/10/2017   UIBC 220 09/10/2017   IRONPCTSAT 23 09/10/2017   Lab Results  Component Value Date  RETICCTPCT 1.1 11/22/2011   RBC 3.67 (L) 11/07/2017   RETICCTABS 52.1 11/22/2011   No results found for: Nils Pyle [facility] Lab Results  Component Value Date   IGA 159 03/09/2012   No results found for: Odetta Pink, SPEI   Chemistry      Component Value Date/Time   NA 139 11/07/2017 0926   NA 137 08/01/2017 0805   NA 144 06/27/2017 0857   K 3.5 11/07/2017 0926   K 3.9 06/27/2017 0857   CL 102 11/07/2017 0926   CL 103 06/27/2017 0857   CO2 28 11/07/2017 0926   CO2 26 06/27/2017 0857   BUN 19 11/07/2017 0926   BUN 12 08/01/2017 0805   BUN 12 06/27/2017 0857   CREATININE 1.00 11/07/2017 0926   CREATININE 1.0 06/27/2017 0857      Component Value Date/Time   CALCIUM 9.1 11/07/2017 0926   CALCIUM 9.2 06/27/2017 0857   ALKPHOS 111 (H) 11/07/2017 0926   ALKPHOS 128 (H) 06/27/2017 0857   AST 31 11/07/2017 0926   ALT 24 11/07/2017 0926   ALT 24 06/27/2017 0857   BILITOT 1.0 11/07/2017 0926      Impression and Plan: Mr. Lopezgarcia is a very pleasant 77 yo caucasian gentleman with diffuse large B-cell lymphoma (not double hit lymphoma).   At this point, we will plan for surveillance.  I know that his lymphoma can always come back.  However, he got into remission pretty quickly.  For right now, we will plan to get him back in August.  Hopefully he will continue to feel better from his valve  repair.   [name], MD 5/10/201911:09 AM

## 2017-11-13 ENCOUNTER — Inpatient Hospital Stay: Payer: Medicare Other

## 2017-11-13 ENCOUNTER — Other Ambulatory Visit: Payer: Self-pay

## 2017-11-13 VITALS — BP 123/62 | HR 63 | Temp 97.7°F | Resp 18

## 2017-11-13 DIAGNOSIS — D508 Other iron deficiency anemias: Secondary | ICD-10-CM

## 2017-11-13 DIAGNOSIS — I248 Other forms of acute ischemic heart disease: Secondary | ICD-10-CM | POA: Diagnosis not present

## 2017-11-13 DIAGNOSIS — C8332 Diffuse large B-cell lymphoma, intrathoracic lymph nodes: Secondary | ICD-10-CM | POA: Diagnosis not present

## 2017-11-13 DIAGNOSIS — D519 Vitamin B12 deficiency anemia, unspecified: Secondary | ICD-10-CM

## 2017-11-13 DIAGNOSIS — D500 Iron deficiency anemia secondary to blood loss (chronic): Secondary | ICD-10-CM | POA: Diagnosis not present

## 2017-11-13 DIAGNOSIS — D510 Vitamin B12 deficiency anemia due to intrinsic factor deficiency: Secondary | ICD-10-CM | POA: Diagnosis not present

## 2017-11-13 MED ORDER — HEPARIN SOD (PORK) LOCK FLUSH 100 UNIT/ML IV SOLN
500.0000 [IU] | Freq: Once | INTRAVENOUS | Status: AC
  Administered 2017-11-13: 500 [IU] via INTRAVENOUS
  Filled 2017-11-13: qty 5

## 2017-11-13 MED ORDER — SODIUM CHLORIDE 0.9 % IV SOLN
510.0000 mg | Freq: Once | INTRAVENOUS | Status: AC
  Administered 2017-11-13: 510 mg via INTRAVENOUS
  Filled 2017-11-13: qty 17

## 2017-11-13 MED ORDER — SODIUM CHLORIDE 0.9% FLUSH
10.0000 mL | INTRAVENOUS | Status: DC | PRN
  Administered 2017-11-13: 10 mL via INTRAVENOUS
  Filled 2017-11-13: qty 10

## 2017-11-13 NOTE — Progress Notes (Signed)
Patient refused to stay for 30 minute post Feraheme observation. Patient discharged ambulatory without concerns or complaints.

## 2017-11-13 NOTE — Patient Instructions (Signed)
Ferumoxytol injection (Feraheme) What is this medicine? FERUMOXYTOL is an iron complex. Iron is used to make healthy red blood cells, which carry oxygen and nutrients throughout the body. This medicine is used to treat iron deficiency anemia in people with chronic kidney disease. This medicine may be used for other purposes; ask your health care provider or pharmacist if you have questions. COMMON BRAND NAME(S): Feraheme What should I tell my health care provider before I take this medicine? They need to know if you have any of these conditions: -anemia not caused by low iron levels -high levels of iron in the blood -magnetic resonance imaging (MRI) test scheduled -an unusual or allergic reaction to iron, other medicines, foods, dyes, or preservatives -pregnant or trying to get pregnant -breast-feeding How should I use this medicine? This medicine is for injection into a vein. It is given by a health care professional in a hospital or clinic setting. Talk to your pediatrician regarding the use of this medicine in children. Special care may be needed. Overdosage: If you think you have taken too much of this medicine contact a poison control center or emergency room at once. NOTE: This medicine is only for you. Do not share this medicine with others. What if I miss a dose? It is important not to miss your dose. Call your doctor or health care professional if you are unable to keep an appointment. What may interact with this medicine? This medicine may interact with the following medications: -other iron products This list may not describe all possible interactions. Give your health care provider a list of all the medicines, herbs, non-prescription drugs, or dietary supplements you use. Also tell them if you smoke, drink alcohol, or use illegal drugs. Some items may interact with your medicine. What should I watch for while using this medicine? Visit your doctor or healthcare professional  regularly. Tell your doctor or healthcare professional if your symptoms do not start to get better or if they get worse. You may need blood work done while you are taking this medicine. You may need to follow a special diet. Talk to your doctor. Foods that contain iron include: whole grains/cereals, dried fruits, beans, or peas, leafy green vegetables, and organ meats (liver, kidney). What side effects may I notice from receiving this medicine? Side effects that you should report to your doctor or health care professional as soon as possible: -allergic reactions like skin rash, itching or hives, swelling of the face, lips, or tongue -breathing problems -changes in blood pressure -feeling faint or lightheaded, falls -fever or chills -flushing, sweating, or hot feelings -swelling of the ankles or feet Side effects that usually do not require medical attention (report to your doctor or health care professional if they continue or are bothersome): -diarrhea -headache -nausea, vomiting -stomach pain This list may not describe all possible side effects. Call your doctor for medical advice about side effects. You may report side effects to FDA at 1-800-FDA-1088. Where should I keep my medicine? This drug is given in a hospital or clinic and will not be stored at home. NOTE: This sheet is a summary. It may not cover all possible information. If you have questions about this medicine, talk to your doctor, pharmacist, or health care provider.  2018 Elsevier/Gold Standard (2015-07-20 12:41:49)  

## 2017-11-14 DIAGNOSIS — I4891 Unspecified atrial fibrillation: Secondary | ICD-10-CM | POA: Diagnosis not present

## 2017-11-14 DIAGNOSIS — I2510 Atherosclerotic heart disease of native coronary artery without angina pectoris: Secondary | ICD-10-CM | POA: Diagnosis not present

## 2017-11-14 DIAGNOSIS — R011 Cardiac murmur, unspecified: Secondary | ICD-10-CM | POA: Diagnosis not present

## 2017-11-14 DIAGNOSIS — R58 Hemorrhage, not elsewhere classified: Secondary | ICD-10-CM | POA: Diagnosis not present

## 2017-11-14 DIAGNOSIS — I5032 Chronic diastolic (congestive) heart failure: Secondary | ICD-10-CM | POA: Insufficient documentation

## 2017-11-14 DIAGNOSIS — E119 Type 2 diabetes mellitus without complications: Secondary | ICD-10-CM | POA: Diagnosis not present

## 2017-11-14 DIAGNOSIS — Z951 Presence of aortocoronary bypass graft: Secondary | ICD-10-CM | POA: Diagnosis not present

## 2017-11-14 DIAGNOSIS — Z7901 Long term (current) use of anticoagulants: Secondary | ICD-10-CM | POA: Diagnosis not present

## 2017-11-14 DIAGNOSIS — Z79899 Other long term (current) drug therapy: Secondary | ICD-10-CM | POA: Insufficient documentation

## 2017-11-14 DIAGNOSIS — Y712 Prosthetic and other implants, materials and accessory cardiovascular devices associated with adverse incidents: Secondary | ICD-10-CM | POA: Diagnosis not present

## 2017-11-14 DIAGNOSIS — T82594A Other mechanical complication of infusion catheter, initial encounter: Secondary | ICD-10-CM | POA: Diagnosis not present

## 2017-11-14 DIAGNOSIS — I10 Essential (primary) hypertension: Secondary | ICD-10-CM | POA: Diagnosis not present

## 2017-11-14 DIAGNOSIS — Z7984 Long term (current) use of oral hypoglycemic drugs: Secondary | ICD-10-CM | POA: Insufficient documentation

## 2017-11-15 ENCOUNTER — Emergency Department (HOSPITAL_COMMUNITY)
Admission: EM | Admit: 2017-11-15 | Discharge: 2017-11-15 | Disposition: A | Discharge status: Home / Self Care | Payer: Medicare Other | Attending: Emergency Medicine | Admitting: Emergency Medicine

## 2017-11-15 ENCOUNTER — Encounter (HOSPITAL_COMMUNITY): Payer: Self-pay | Admitting: Nurse Practitioner

## 2017-11-15 DIAGNOSIS — T82594A Other mechanical complication of infusion catheter, initial encounter: Secondary | ICD-10-CM | POA: Diagnosis not present

## 2017-11-15 DIAGNOSIS — R58 Hemorrhage, not elsewhere classified: Secondary | ICD-10-CM

## 2017-11-15 LAB — CBC WITH DIFFERENTIAL/PLATELET
Basophils Absolute: 0 10*3/uL (ref 0.0–0.1)
Basophils Relative: 1 %
Eosinophils Absolute: 0.1 10*3/uL (ref 0.0–0.7)
Eosinophils Relative: 2 %
HCT: 36.8 % — ABNORMAL LOW (ref 39.0–52.0)
HEMOGLOBIN: 11.9 g/dL — AB (ref 13.0–17.0)
LYMPHS PCT: 32 %
Lymphs Abs: 1.4 10*3/uL (ref 0.7–4.0)
MCH: 29.5 pg (ref 26.0–34.0)
MCHC: 32.3 g/dL (ref 30.0–36.0)
MCV: 91.3 fL (ref 78.0–100.0)
Monocytes Absolute: 0.6 10*3/uL (ref 0.1–1.0)
Monocytes Relative: 13 %
Neutro Abs: 2.3 10*3/uL (ref 1.7–7.7)
Neutrophils Relative %: 52 %
Platelets: 109 10*3/uL — ABNORMAL LOW (ref 150–400)
RBC: 4.03 MIL/uL — AB (ref 4.22–5.81)
RDW: 16.9 % — ABNORMAL HIGH (ref 11.5–15.5)
WBC: 4.5 10*3/uL (ref 4.0–10.5)

## 2017-11-15 LAB — BASIC METABOLIC PANEL
Anion gap: 15 (ref 5–15)
BUN: 22 mg/dL — AB (ref 6–20)
CO2: 22 mmol/L (ref 22–32)
Calcium: 10 mg/dL (ref 8.9–10.3)
Chloride: 101 mmol/L (ref 101–111)
Creatinine, Ser: 1.11 mg/dL (ref 0.61–1.24)
GFR calc Af Amer: >60 mL/min (ref >60)
GLUCOSE: 96 mg/dL (ref 65–99)
POTASSIUM: 3.8 mmol/L (ref 3.5–5.1)
Sodium: 138 mmol/L (ref 135–145)

## 2017-11-15 LAB — PROTIME-INR
INR: 1.15
Prothrombin Time: 14.6 seconds (ref 11.4–15.2)

## 2017-11-15 MED ORDER — TRANEXAMIC ACID 1000 MG/10ML IV SOLN
500.0000 mg | Freq: Once | INTRAVENOUS | Status: AC
  Administered 2017-11-15: 500 mg via TOPICAL
  Filled 2017-11-15: qty 10

## 2017-11-15 NOTE — ED Triage Notes (Signed)
Pt states he noticed that his port-a-cath is bleeding. He reports being on a blood thinner (Eliquis) and was concerned that the bleeding wouldn't stop.

## 2017-11-15 NOTE — ED Provider Notes (Signed)
DEPT Provider Note   CSN: [id number] Arrival date & time: 11/14/17  2337     History   Chief Complaint Chief Complaint  Patient presents with  . Port-A-Cath Bleeding    HPI Richard [name] is a 77 y.o. male.  77 yo male with history of CAD s/p CABG, GERD, HTN, HLD, DM2, obesity, sleep apnea, chronic diastolic CHF, diffuse large B cell lymphoma, paroxysmal atrial fib/flutter, complete heart block s/p PPM and severe aortic stenosis who is now s/p TAVR 8 weeks ago.  He developed bleeding from his Port-A-Cath tonight while sitting watching television.  He states the blood was trickling down his chest and onto his shirts.  He does take Eliquis and became concerned.  He denies any trauma to the Port-A-Cath.  He was last use yesterday for an iron infusion.  Patient denies any dizziness, chest pain, shortness of breath.  States he feels fine which is concerned about the amount of bleeding he was having.  The history is provided by the patient.    Past Medical History:  Diagnosis Date  . Anemia   . Axillary adenopathy 02/25/2017  . Bradycardia    a. holter monitor has demonstrated HRs in 30s and [place]   . CAD (coronary artery disease)    a. s/p CABG 2001  . Chronic lower back pain   . Diffuse large B cell lymphoma ([place])   . Diverticulosis   . Esophageal stricture   . GERD (gastroesophageal reflux disease)   . History of gout   . HTN (hypertension)   . Mixed hyperlipidemia   . OSA on CPAP    with 2L O2 at night  . Pancytopenia ([place])    a. related to chemo therapy for B cell lymphoma  . Peptic stricture of esophagus   . Severe aortic stenosis   . Spinal stenosis   . Type II diabetes mellitus [facility])     Patient Active Problem List   Diagnosis Date Noted  . Severe aortic stenosis   . Complete heart block ([place])   . Paroxysmal atrial fibrillation (H[facility])   . Acute on chronic diastolic CHF (congestive heart failure), NYHA class 2 ([place])    . Left arm swelling   . Status post transcatheter aortic valve replacement (TAVR) using bioprosthesis 09/09/2017  . Demand ischemia of myocardium ([place])   . Typical atrial flutter ([place])   . Acute on chronic diastolic heart failure ([place])   . GIB (gastrointestinal bleeding) 08/16/2017  . Acute hypoxemic respiratory failure ([place]) 08/16/2017  . Hyperlipidemia 08/05/2017  . NSTEMI (non-ST elevated myocardial infarction) ([place])   . Aortic stenosis, severe 07/25/2017  . Pancytopenia ([place]) 07/24/2017  . 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 ([place]) 02/27/2017  . Bradycardia 01/04/2017  . Coronary artery disease 01/04/2017  . Stenosis of carotid artery 01/04/2017  . Obesity 09/18/2016  . Wenckebach block   . Spinal stenosis, lumbar region, with neurogenic claudication 06/17/2013  . DM (diabetes mellitus) type II uncontrolled, periph vascular disorder ([place]) 05/21/2013  . Spinal stenosis of lumbar region 04/08/2013  . Anemia, iron deficiency 11/29/2011  . B12 deficiency anemia 07/30/2011  . OSA (obstructive sleep apnea) 07/06/2010  . CAD, ARTERY BYPASS GRAFT 08/22/2009  . Essential hypertension 03/29/2009  . Bilateral lower extremity edema 02/21/2009  . Hyperlipidemia LDL goal <70 11/26/2008    Past Surgical History:  Procedure Laterality Date  . APPENDECTOMY  ~ 1952  . BACK SURGERY    . CATARACT EXTRACTION  W/PHACO Left 11/06/2016   Procedure: CATARACT EXTRACTION PHACO AND INTRAOCULAR LENS PLACEMENT (IOC);  Surgeon: Estill Cotta, MD;  Location: [facility] ORS;  Service: Ophthalmology;  Laterality: Left;  Lot # [id number] H [place]: 01:09.4 AP%:25.2 CDE: 30.64  . CATARACT EXTRACTION W/PHACO Right 12/04/2016   Procedure: CATARACT EXTRACTION PHACO AND INTRAOCULAR LENS PLACEMENT (IOC);  Surgeon: Estill Cotta, MD;  Location: [facility] ORS;  Service: Ophthalmology;  Laterality: Right;  [place] 1:25.9 AP% 24.1 CDE 39.10 Fluid Pack lot # [id number]  . COLONOSCOPY W/ BIOPSIES AND POLYPECTOMY   2013  . CORONARY ANGIOPLASTY  1993  . CORONARY ANGIOPLASTY WITH STENT PLACEMENT  05/1997   "1"  . CORONARY ARTERY BYPASS GRAFT  03/2000   "CABG X5"  . ESOPHAGEAL DILATION  X 3-4   Dr. Lyla Son; "last one was in the 1990's"  . ESOPHAGOGASTRODUODENOSCOPY     multiple  . FLEXIBLE SIGMOIDOSCOPY     multiple  . HYDRADENITIS EXCISION Left 02/25/2017   Procedure: EXCISION DEEP LEFT AXILLARY LYMPH NODE;  Surgeon: Fanny Skates, MD;  Location: [place];  Service: General;  Laterality: Left;  . INTRAOPERATIVE TRANSTHORACIC ECHOCARDIOGRAM N/A 09/09/2017   Procedure: INTRAOPERATIVE TRANSTHORACIC ECHOCARDIOGRAM;  Surgeon: Burnell Blanks, MD;  Location: [place];  Service: Open Heart Surgery;  Laterality: N/A;  . KNEE ARTHROSCOPY Left 2011   meniscus repair  . LEFT HEART CATHETERIZATION WITH CORONARY/GRAFT ANGIOGRAM N/A 03/16/2014   Procedure: LEFT HEART CATHETERIZATION WITH Beatrix Fetters;  Surgeon: Burnell Blanks, MD;  Location: [facility] CATH LAB;  Service: Cardiovascular;  Laterality: N/A;  . LUMBAR LAMINECTOMY/DECOMPRESSION MICRODISCECTOMY Right 06/17/2013   Procedure: LUMBAR LAMINECTOMY MICRODISCECTOMY L4-L5 RIGHT EXCISION OF SYNOVIAL CYST RIGHT   (1 LEVEL) RIGHT PARTIAL FACETECTOMY;  Surgeon: Tobi Bastos, MD;  Location: [facility] ORS;  Service: Orthopedics;  Laterality: Right;  . MYELOGRAM  04/06/13   lumbar, Dr Gladstone Lighter  . PACEMAKER IMPLANT N/A 09/10/2017   Procedure: PACEMAKER IMPLANT;  Surgeon: Deboraha Sprang, MD;  Location: [place] CV LAB;  Service: Cardiovascular;  Laterality: N/A;  . PANENDOSCOPY    . PORTACATH PLACEMENT N/A 03/06/2017   Procedure: INSERTION PORT-A-CATH AND ASPIRATE SEROMA LEFT AXILLA;  Surgeon: Fanny Skates, MD;  Location: [place];  Service: General;  Laterality: N/A;  . RIGHT/LEFT HEART CATH AND CORONARY/GRAFT ANGIOGRAPHY N/A 07/28/2017   Procedure: RIGHT/LEFT HEART CATH AND CORONARY/GRAFT ANGIOGRAPHY;  Surgeon: Sherren Mocha, MD;  Location: [place]  CV LAB;  Service: Cardiovascular;  Laterality: N/A;  . SHOULDER SURGERY Right 08/2010   screws placed; "tendons tore off"  . SKIN CANCER EXCISION Right    "neck"  . TONSILLECTOMY  ~ 1954  . TRANSCATHETER AORTIC VALVE REPLACEMENT, TRANSFEMORAL N/A 09/09/2017   Procedure: TRANSCATHETER AORTIC VALVE REPLACEMENT, TRANSFEMORAL;  Surgeon: Burnell Blanks, MD;  Location: [place];  Service: Open Heart Surgery;  Laterality: N/A;  . UPPER GI ENDOSCOPY  2013   Gastritis; Dr Carlean Purl  . VASECTOMY          Home Medications    Prior to Admission medications   Medication Sig Start Date End Date Taking? Authorizing Provider  allopurinol (ZYLOPRIM) 300 MG tablet Take 450 mg by mouth daily.     [provider]  amLODipine (NORVASC) 5 MG tablet TAKE 2 TABLETS (10 MG TOTAL) BY MOUTH DAILY. Patient taking differently: Take 1 tablet BID 01/15/17   Minus Breeding, MD  apixaban (ELIQUIS) 5 MG TABS tablet Take 1 tablet (5 mg total) by mouth 2 (two) times daily. 09/12/17   Cheryln Manly,  NP  aspirin 81 MG tablet Take 81 mg by mouth daily.    [provider]  atorvastatin (LIPITOR) 80 MG tablet TAKE ONE TABLET BY MOUTH AT BEDTIME 01/29/17   Minus Breeding, MD  cephALEXin (KEFLEX) 500 MG capsule Take 1 capsule (500 mg total) by mouth 2 (two) times daily. 09/29/17   [name], DO  esomeprazole (NEXIUM) 40 MG capsule Take 1 capsule (40 mg total) by mouth daily. 10/15/12   [name], MD  ezetimibe (ZETIA) 10 MG tablet TAKE ONE TABLET BY MOUTH DAILY 01/15/17   Minus Breeding, MD  famciclovir (FAMVIR) 500 MG tablet Take 1 tablet (500 mg total) by mouth daily. 02/27/17   [name], MD  fluticasone (FLONASE) 50 MCG/ACT nasal spray INHALE ONE SPRAY INTO EACH NOSTRIL TWICE DAILY. 05/05/17   [name], DO  furosemide (LASIX) 40 MG tablet Take 120 mg by mouth 2 (two) times daily.    [provider]  glimepiride (AMARYL) 2 MG tablet TAKE 1 TABLET (2 MG  TOTAL) BY MOUTH DAILY WITH BREAKFAST. 08/18/17   Roma Schanz R, DO  glucose blood (FREESTYLE LITE) test strip TEST BLOOD SUGAR bid 04/03/17   Carollee Herter, Alferd Apa, DO  Lancets (FREESTYLE) lancets Use BID to check blood sugar.  DX E11.9 04/03/17   [name], DO  lipase/protease/amylase (CREON) 36000 UNITS CPEP capsule Take 2 capsules (72,000 Units total) by mouth 3 (three) times daily with meals. 1 capsule with snack. 05/30/17   [place], Holli Humbles, NP  LYRICA 75 MG capsule TAKE 1 CAPSULE BY MOUTH THREE TIMES A DAY 10/07/17   Carollee Herter, Yvonne R, DO  METFORMIN HCL PO Take 40 mg by mouth 2 (two) times daily with a meal. Pt takes 1.5 tablets in the AM and 1 tablet in the PM    [provider]  nitroGLYCERIN (NITROSTAT) 0.4 MG SL tablet Place 1 tablet (0.4 mg total) under the tongue every 5 (five) minutes as needed for chest pain. 09/18/16   Minus Breeding, MD  polycarbophil (FIBERCON) 625 MG tablet Take 625 mg by mouth daily.    [provider]  potassium chloride SA (K-DUR,KLOR-CON) 20 MEQ tablet Take 40 mEq by mouth 2 (two) times daily.    [provider]  psyllium (METAMUCIL) 58.6 % powder Take 1 packet by mouth 3 (three) times daily.    [provider]    Family History Family History  Problem Relation Age of Onset  . Stroke Father   . Hypertension Father   . Pancreatic cancer Mother   . Diabetes Maternal Grandmother   . Stroke Maternal Grandmother   . Heart attack Paternal Grandmother   . Colon cancer Neg Hx   . Esophageal cancer Neg Hx   . Rectal cancer Neg Hx   . Stomach cancer Neg Hx   . Ulcers Neg Hx     Social History Social History   Tobacco Use  . Smoking status: Never Smoker  . Smokeless tobacco: Never Used  Substance Use Topics  . Alcohol use: No    Alcohol/week: 0.0 oz    Comment: "last drink was in 2012"( 03/15/2014)  . Drug use: No     Allergies   Benazepril; Hctz [hydrochlorothiazide]; Aspirin; and Lactose  intolerance (gi)   Review of Systems Review of Systems  Constitutional: Negative for activity change, appetite change and fever.  Eyes: Negative for visual disturbance.  Respiratory: Negative for cough, chest tightness and  shortness of breath.   Cardiovascular: Negative for chest pain.  Gastrointestinal: Negative for abdominal pain, nausea and vomiting.  Genitourinary: Negative for dysuria, hematuria and testicular pain.  Musculoskeletal: Negative for arthralgias and myalgias.  Skin: Negative for rash.  Neurological: Negative for dizziness, weakness, light-headedness and headaches.   all other systems are negative except as noted in the HPI and PMH.     Physical Exam Updated Vital Signs BP (!) 152/76 (BP Location: Left Arm)   Pulse 75   Temp 98.2 F (36.8 C) (Oral)   Resp 20   SpO2 99%   Physical Exam  Constitutional: He is oriented to person, place, and time. He appears well-developed and well-nourished. No distress.  Appears well, no distress.  HENT:  Head: Normocephalic and atraumatic.  Mouth/Throat: Oropharynx is clear and moist. No oropharyngeal exudate.  Eyes: Pupils are equal, round, and reactive to light. Conjunctivae and EOM are normal.  Neck: Normal range of motion. Neck supple.  No meningismus.  Cardiovascular: Normal rate, regular rhythm and intact distal pulses.  Murmur heard. Pulmonary/Chest: Effort normal and breath sounds normal. No respiratory distress.  Small amount of dried blood the patient's shirt.  Port-A-Cath has a bandage in place which is dry.  There is no active bleeding.  There is no hematoma.  Abdominal: Soft. There is no tenderness. There is no rebound and no guarding.  Musculoskeletal: Normal range of motion. He exhibits no edema or tenderness.  Neurological: He is alert and oriented to person, place, and time. No cranial nerve deficit. He exhibits normal muscle tone. Coordination normal.  No ataxia on finger to nose bilaterally. No pronator  drift. 5/5 strength throughout. CN 2-12 intact.Equal grip strength. Sensation intact.   Skin: Skin is warm.  Psychiatric: He has a normal mood and affect. His behavior is normal.  Nursing note and vitals reviewed.    ED Treatments / Results  Labs (all labs ordered are listed, but only abnormal results are displayed) Labs Reviewed  CBC WITH DIFFERENTIAL/PLATELET - Abnormal; Notable for the following components:      Result Value   RBC 4.03 (*)    Hemoglobin 11.9 (*)    HCT 36.8 (*)    RDW 16.9 (*)    Platelets 109 (*)    All other components within normal limits  BASIC METABOLIC PANEL - Abnormal; Notable for the following components:   BUN 22 (*)    All other components within normal limits  PROTIME-INR    EKG None  Radiology No results found.  Procedures Procedures (including critical care time)  Medications Ordered in ED Medications  tranexamic acid (CYKLOKAPRON) injection 500 mg (has no administration in time range)     Initial Impression / Assessment and Plan / ED Course  I have reviewed the triage vital signs and the nursing notes.  Pertinent labs & imaging results that were available during my care of the patient were reviewed by me and considered in my medical decision making (see chart for details).    Patient on Eliquis presenting with bleeding from Port-A-Cath, now resolved.  No chest pain or shortness of breath.  Hemoglobin stable.  Vitals are reassuring.  Patient given topical TXA over Port-A-Cath site.  There is no evidence of further bleeding or hematoma.  Patient feels well.  He is requesting discharge and will follow-up with his PCP.  Return precautions discussed.   Final Clinical Impressions(s) / ED Diagnoses   Final diagnoses:  Bleeding    ED Discharge Orders  None       Ezequiel Essex, MD 11/15/17 [phone]

## 2017-11-15 NOTE — Discharge Instructions (Addendum)
If bleeding recurs, apply pressure.  Follow-up with your doctor.  Return to the ED if you develop new or worsening symptoms.

## 2017-11-18 DIAGNOSIS — M25562 Pain in left knee: Secondary | ICD-10-CM | POA: Diagnosis not present

## 2017-11-23 ENCOUNTER — Other Ambulatory Visit: Payer: Self-pay | Admitting: Family Medicine

## 2017-11-25 NOTE — Telephone Encounter (Signed)
Received refill request for glimepiride 2 mg tablet. Last office visit 09/29/17 and last refill 08/18/17. Refill sent to pt's pharmacy.

## 2017-11-26 ENCOUNTER — Encounter: Payer: Self-pay | Admitting: Medical

## 2017-11-26 ENCOUNTER — Ambulatory Visit (INDEPENDENT_AMBULATORY_CARE_PROVIDER_SITE_OTHER): Payer: Medicare Other | Admitting: Medical

## 2017-11-26 VITALS — BP 123/59 | HR 68 | Temp 97.6°F | Resp 16 | Ht 72.0 in | Wt 241.8 lb

## 2017-11-26 DIAGNOSIS — J301 Allergic rhinitis due to pollen: Secondary | ICD-10-CM

## 2017-11-26 DIAGNOSIS — J40 Bronchitis, not specified as acute or chronic: Secondary | ICD-10-CM | POA: Diagnosis not present

## 2017-11-26 DIAGNOSIS — I248 Other forms of acute ischemic heart disease: Secondary | ICD-10-CM | POA: Diagnosis not present

## 2017-11-26 MED ORDER — AZITHROMYCIN 250 MG PO TABS: ORAL_TABLET | ORAL | 0 refills | Status: DC

## 2017-11-26 MED ORDER — BENZONATATE 100 MG PO CAPS: 100.0000 mg | ORAL_CAPSULE | Freq: Three times a day (TID) | ORAL | 0 refills | Status: DC | PRN

## 2017-11-26 NOTE — Progress Notes (Signed)
Subjective:    Patient ID: Richard [name], male    DOB: 1941-06-07, 77 y.o.   MRN: [id number]  HPI  Pt in for some recent nasal and chest congestion. Symptom for 2 days. States after cutting grass and exposed to a lot of dust. Then that night got sore throat. Next morning felt tired and started coughing up mucus.  No fever, no chills or sweats.  Pt give me update large b cell lymphoma. He was treated by Dr. Marin Olp.  Last wbc count was 4.5      Review of Systems  Constitutional: Positive for fatigue. Negative for chills and fever.       Slight faint fatigue.  HENT: Positive for congestion and sneezing. Negative for ear pain, postnasal drip, sinus pressure, sinus pain and sore throat.   Respiratory: Positive for cough. Negative for chest tightness, shortness of breath and wheezing.   Cardiovascular: Negative for chest pain and palpitations.  Gastrointestinal: Negative for abdominal pain.  Musculoskeletal: Negative for back pain, myalgias, neck pain and neck stiffness.       Faint achiness back. Not diffuse or severe.  Skin: Negative for rash.  Neurological: Negative for dizziness and light-headedness.  Hematological: Negative for adenopathy. Does not bruise/bleed easily.  Psychiatric/Behavioral: Negative for behavioral problems and confusion.    Past Medical History:  Diagnosis Date  . Anemia   . Axillary adenopathy 02/25/2017  . Bradycardia    a. holter monitor has demonstrated HRs in 30s and [place]   . CAD (coronary artery disease)    a. s/p CABG 2001  . Chronic lower back pain   . Diffuse large B cell lymphoma ([place])   . Diverticulosis   . Esophageal stricture   . GERD (gastroesophageal reflux disease)   . History of gout   . HTN (hypertension)   . Mixed hyperlipidemia   . OSA on CPAP    with 2L O2 at night  . Pancytopenia ([place])    a. related to chemo therapy for B cell lymphoma  . Peptic stricture of esophagus   . Severe aortic stenosis   . Spinal  stenosis   . Type II diabetes mellitus ([place])      Social History   Socioeconomic History  . Marital status: Divorced    Spouse name: Not on file  . Number of children: 2  . Years of education: Not on file  . Highest education level: Not on file  Occupational History    Employer: RETIRED  Social Needs  . Financial resource strain: Not on file  . Food insecurity:    Worry: Not on file    Inability: Not on file  . Transportation needs:    Medical: Not on file    Non-medical: Not on file  Tobacco Use  . Smoking status: Never Smoker  . Smokeless tobacco: Never Used  Substance and Sexual Activity  . Alcohol use: No    Alcohol/week: 0.0 oz    Comment: "last drink was in 2012"( 03/15/2014)  . Drug use: No  . Sexual activity: Not Currently  Lifestyle  . Physical activity:    Days per week: Not on file    Minutes per session: Not on file  . Stress: Not on file  Relationships  . Social connections:    Talks on phone: Not on file    Gets together: Not on file    Attends religious service: Not on file    Active member of club or organization: Not on  file    Attends meetings of clubs or organizations: Not on file    Relationship status: Not on file  . Intimate partner violence:    Fear of current or ex partner: Not on file    Emotionally abused: Not on file    Physically abused: Not on file    Forced sexual activity: Not on file  Other Topics Concern  . Not on file  Social History Narrative   Divorced, lives with a roommate.   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    Past Surgical History:  Procedure Laterality Date  . APPENDECTOMY  ~ 1952  . BACK SURGERY    . CATARACT EXTRACTION W/PHACO Left 11/06/2016   Procedure: CATARACT EXTRACTION PHACO AND INTRAOCULAR LENS PLACEMENT (IOC);  Surgeon:  Estill Cotta, MD;  Location: [facility];  Service: Ophthalmology;  Laterality: Left;  Lot # [id number] H [place]: 01:09.4 AP%:25.2 CDE: 30.64  . CATARACT EXTRACTION W/PHACO Right 12/04/2016   Procedure: CATARACT EXTRACTION PHACO AND INTRAOCULAR LENS PLACEMENT (IOC);  Surgeon: Estill Cotta, MD;  Location: [facility];  Service: Ophthalmology;  Laterality: Right;  [place] 1:25.9 AP% 24.1 CDE 39.10 Fluid Pack lot # [id number]  . COLONOSCOPY W/ BIOPSIES AND POLYPECTOMY  2013  . CORONARY ANGIOPLASTY  1993  . CORONARY ANGIOPLASTY WITH STENT PLACEMENT  05/1997   "1"  . CORONARY ARTERY BYPASS GRAFT  03/2000   "CABG X5"  . ESOPHAGEAL DILATION  X 3-4   Dr. Lyla Son; "last one was in the 1990's"  . ESOPHAGOGASTRODUODENOSCOPY     multiple  . FLEXIBLE SIGMOIDOSCOPY     multiple  . HYDRADENITIS EXCISION Left 02/25/2017   Procedure: EXCISION DEEP LEFT AXILLARY LYMPH NODE;  Surgeon: Fanny Skates, MD;  Location: [place];  Service: General;  Laterality: Left;  . INTRAOPERATIVE TRANSTHORACIC ECHOCARDIOGRAM N/A 09/09/2017   Procedure: INTRAOPERATIVE TRANSTHORACIC ECHOCARDIOGRAM;  Surgeon: Burnell Blanks, MD;  Location: [facility];  Service: Open Heart Surgery;  Laterality: N/A;  . KNEE ARTHROSCOPY Left 2011   meniscus repair  . LEFT HEART CATHETERIZATION WITH CORONARY/GRAFT ANGIOGRAM N/A 03/16/2014   Procedure: LEFT HEART CATHETERIZATION WITH Beatrix Fetters;  Surgeon: Burnell Blanks, MD;  Location: [facility] CATH LAB;  Service: Cardiovascular;  Laterality: N/A;  . LUMBAR LAMINECTOMY/DECOMPRESSION MICRODISCECTOMY Right 06/17/2013   Procedure: LUMBAR LAMINECTOMY MICRODISCECTOMY L4-L5 RIGHT EXCISION OF SYNOVIAL CYST RIGHT   (1 LEVEL) RIGHT PARTIAL FACETECTOMY;  Surgeon: Tobi Bastos, MD;  Location: [facility];  Service: Orthopedics;  Laterality: Right;  . MYELOGRAM  04/06/13   lumbar, Dr Gladstone Lighter  . PACEMAKER IMPLANT N/A 09/10/2017   Procedure: PACEMAKER IMPLANT;  Surgeon: Deboraha Sprang, MD;  Location:  [facility];  Service: Cardiovascular;  Laterality: N/A;  . PANENDOSCOPY    . PORTACATH PLACEMENT N/A 03/06/2017   Procedure: INSERTION PORT-A-CATH AND ASPIRATE SEROMA LEFT AXILLA;  Surgeon: Fanny Skates, MD;  Location: [place];  Service: General;  Laterality: N/A;  . RIGHT/LEFT HEART CATH AND CORONARY/GRAFT ANGIOGRAPHY N/A 07/28/2017   Procedure: RIGHT/LEFT HEART CATH AND CORONARY/GRAFT ANGIOGRAPHY;  Surgeon: Sherren Mocha, MD;  Location: [facility];  Service: Cardiovascular;  Laterality: N/A;  . SHOULDER SURGERY Right 08/2010   screws placed; "tendons tore off"  . SKIN CANCER EXCISION Right    "neck"  . TONSILLECTOMY  ~ 1954  .  TRANSCATHETER AORTIC VALVE REPLACEMENT, TRANSFEMORAL N/A 09/09/2017   Procedure: TRANSCATHETER AORTIC VALVE REPLACEMENT, TRANSFEMORAL;  Surgeon: Burnell Blanks, MD;  Location: [place];  Service: Open Heart Surgery;  Laterality: N/A;  . UPPER GI ENDOSCOPY  2013   Gastritis; Dr Carlean Purl  . VASECTOMY      Family History  Problem Relation Age of Onset  . Stroke Father   . Hypertension Father   . Pancreatic cancer Mother   . Diabetes Maternal Grandmother   . Stroke Maternal Grandmother   . Heart attack Paternal Grandmother   . Colon cancer Neg Hx   . Esophageal cancer Neg Hx   . Rectal cancer Neg Hx   . Stomach cancer Neg Hx   . Ulcers Neg Hx     Allergies  Allergen Reactions  . Benazepril Swelling    angioedema; he is not a candidate for any angiotensin receptor blockers because of this significant allergic reaction. Because of a history of documented adverse serious drug reaction;Medi Alert bracelet  is recommended  . Hctz [Hydrochlorothiazide] Anaphylaxis and Swelling    Tongue and lip swelling   . Aspirin Other (See Comments)    Gastritis, cant take 325 Mg aspirin   . Lactose Intolerance (Gi) Nausea And Vomiting    Current Outpatient Medications on File Prior to Visit  Medication Sig Dispense Refill  . allopurinol (ZYLOPRIM)  300 MG tablet Take 450 mg by mouth daily.     [name] amLODipine (NORVASC) 5 MG tablet TAKE 2 TABLETS (10 MG TOTAL) BY MOUTH DAILY. (Patient taking differently: Take 1 tablet BID) 60 tablet 11  . apixaban (ELIQUIS) 5 MG TABS tablet Take 1 tablet (5 mg total) by mouth 2 (two) times daily. 60 tablet 1  . aspirin 81 MG tablet Take 81 mg by mouth daily.    [name] atorvastatin (LIPITOR) 80 MG tablet TAKE ONE TABLET BY MOUTH AT BEDTIME 90 tablet 3  . cephALEXin (KEFLEX) 500 MG capsule Take 1 capsule (500 mg total) by mouth 2 (two) times daily. 20 capsule 0  . esomeprazole (NEXIUM) 40 MG capsule Take 1 capsule (40 mg total) by mouth daily. 30 capsule 5  . ezetimibe (ZETIA) 10 MG tablet TAKE ONE TABLET BY MOUTH DAILY 90 tablet 3  . famciclovir (FAMVIR) 500 MG tablet Take 1 tablet (500 mg total) by mouth daily. 30 tablet 12  . fluticasone (FLONASE) 50 MCG/ACT nasal spray INHALE ONE SPRAY INTO EACH NOSTRIL TWICE DAILY. 16 g 6  . furosemide (LASIX) 40 MG tablet Take 120 mg by mouth 2 (two) times daily.    [name] glimepiride (AMARYL) 2 MG tablet TAKE 1 TABLET (2 MG TOTAL) BY MOUTH DAILY WITH BREAKFAST. 90 tablet 0  . glucose blood (FREESTYLE LITE) test strip TEST BLOOD SUGAR bid 100 each 11  . Lancets (FREESTYLE) lancets Use BID to check blood sugar.  DX E11.9 100 each 6  . lipase/protease/amylase (CREON) 36000 UNITS CPEP capsule Take 2 capsules (72,000 Units total) by mouth 3 (three) times daily with meals. 1 capsule with snack. 210 capsule 2  . LYRICA 75 MG capsule TAKE 1 CAPSULE BY MOUTH THREE TIMES A DAY 270 capsule 1  . METFORMIN HCL PO Take 40 mg by mouth 2 (two) times daily with a meal. Pt takes 1.5 tablets in the AM and 1 tablet in the PM    . nitroGLYCERIN (NITROSTAT) 0.4 MG SL tablet Place 1 tablet (0.4 mg total) under the tongue every 5 (five) minutes as needed for chest pain. 25  tablet 3  . polycarbophil (FIBERCON) 625 MG tablet Take 625 mg by mouth daily.    . potassium chloride SA (K-DUR,KLOR-CON) 20 MEQ tablet  Take 40 mEq by mouth 2 (two) times daily.    . psyllium (METAMUCIL) 58.6 % powder Take 1 packet by mouth 3 (three) times daily.     Current Facility-Administered Medications on File Prior to Visit  Medication Dose Route Frequency Provider Last Rate Last Dose  . sodium chloride flush (NS) 0.9 % injection 10 mL  10 mL Intravenous PRN [name], MD   10 mL at 05/30/17 0920    BP (!) 123/59   Pulse 68   Temp 97.6 F (36.4 C) (Oral)   Resp 16   Ht 6' (1.829 m)   Wt 241 lb 12.8 oz (109.7 kg)   SpO2 97%   BMI 32.79 kg/m       Objective:   Physical Exam  General  Mental Status - Alert. General Appearance - Well groomed. Not in acute distress.  Skin Rashes- No Rashes.  HEENT Head- Normal. Ear Auditory Canal - Left- Normal. Right - Normal.Tympanic Membrane- Left- Normal. Right- Normal. Eye Sclera/Conjunctiva- Left- Normal. Right- Normal. Nose & Sinuses Nasal Mucosa- Left-  Boggy and Congested. Right-  Boggy and  Congested.Bilateral no maxillary and  No frontal sinus pressure. Mouth & Throat Lips: Upper Lip- Normal: no dryness, cracking, pallor, cyanosis, or vesicular eruption. Lower Lip-Normal: no dryness, cracking, pallor, cyanosis or vesicular eruption. Buccal Mucosa- Bilateral- No Aphthous ulcers. Oropharynx- No Discharge or Erythema. Tonsils: Characteristics- Bilateral- No Erythema or Congestion. Size/Enlargement- Bilateral- No enlargement. Discharge- bilateral-None.  Neck Neck- Supple. No Masses.   Chest and Lung Exam Auscultation: Breath Sounds:-Clear even and unlabored.  Cardiovascular Auscultation:Rythm- Regular, rate and rhythm. Murmurs & Other Heart Sounds:Ausculatation of the heart reveal- No Murmurs.  Lymphatic Head & Neck General Head & Neck Lymphatics: Bilateral: Description- No Localized lymphadenopathy.       Assessment & Plan:   You appear to have bronchitis and sinusitis following allergies/extreme dust exposure. Rest hydrate and tylenol  for fever. I am prescribing cough medicine benzonatate, and azithromycin antibiotic. For your nasal congestion rx flonase  You should gradually get better. If not then notify us and would recommend a chest xray.  In light of your general condition/medical history and fact leaving out of town this weekend, I decided to go ahead and give you antibiotic today.  Follow up in 7-10 days or as needed  [employer], [employer]

## 2017-11-26 NOTE — Patient Instructions (Addendum)
You appear to have bronchitis and sinusitis following allergies/extreme dust exposure. Rest hydrate and tylenol for fever. I am prescribing cough medicine benzonatate, and azithromycin antibiotic. For your nasal congestion use your  flonase  You should gradually get better. If not then notify us and would recommend a chest xray.  In light of your general condition/medical history and fact leaving out of town this weekend, I decided to go ahead and give you antibiotic today.  Follow up in 7-10 days or as needed

## 2017-12-01 ENCOUNTER — Encounter: Payer: Self-pay | Admitting: Family Medicine

## 2017-12-01 ENCOUNTER — Ambulatory Visit (HOSPITAL_BASED_OUTPATIENT_CLINIC_OR_DEPARTMENT_OTHER)
Admission: RE | Admit: 2017-12-01 | Discharge: 2017-12-01 | Disposition: A | Discharge status: Home / Self Care | Payer: Medicare Other | Source: Ambulatory Visit | Attending: Family Medicine | Admitting: Family Medicine

## 2017-12-01 ENCOUNTER — Ambulatory Visit: Payer: Medicare Other

## 2017-12-01 ENCOUNTER — Ambulatory Visit (INDEPENDENT_AMBULATORY_CARE_PROVIDER_SITE_OTHER): Payer: Medicare Other | Admitting: Family Medicine

## 2017-12-01 VITALS — BP 118/66 | HR 76 | Temp 97.9°F | Resp 16 | Ht 72.0 in | Wt 240.2 lb

## 2017-12-01 DIAGNOSIS — R05 Cough: Secondary | ICD-10-CM | POA: Diagnosis not present

## 2017-12-01 DIAGNOSIS — J40 Bronchitis, not specified as acute or chronic: Secondary | ICD-10-CM

## 2017-12-01 DIAGNOSIS — I248 Other forms of acute ischemic heart disease: Secondary | ICD-10-CM | POA: Diagnosis not present

## 2017-12-01 LAB — CBC WITH DIFFERENTIAL/PLATELET
Basophils Absolute: 0.1 10*3/uL (ref 0.0–0.1)
Basophils Relative: 1.4 % (ref 0.0–3.0)
Eosinophils Absolute: 0.2 10*3/uL (ref 0.0–0.7)
Eosinophils Relative: 4.6 % (ref 0.0–5.0)
HEMATOCRIT: 39.9 % (ref 39.0–52.0)
HEMOGLOBIN: 13 g/dL (ref 13.0–17.0)
LYMPHS PCT: 33.9 % (ref 12.0–46.0)
Lymphs Abs: 1.3 10*3/uL (ref 0.7–4.0)
MCHC: 32.5 g/dL (ref 30.0–36.0)
MCV: 92.3 fl (ref 78.0–100.0)
MONO ABS: 0.8 10*3/uL (ref 0.1–1.0)
Monocytes Relative: 20.5 % — ABNORMAL HIGH (ref 3.0–12.0)
Neutro Abs: 1.5 10*3/uL (ref 1.4–7.7)
Neutrophils Relative %: 39.6 % — ABNORMAL LOW (ref 43.0–77.0)
Platelets: 103 10*3/uL — ABNORMAL LOW (ref 150.0–400.0)
RBC: 4.32 Mil/uL (ref 4.22–5.81)
RDW: 19.6 % — ABNORMAL HIGH (ref 11.5–15.5)
WBC: 3.8 10*3/uL — AB (ref 4.0–10.5)

## 2017-12-01 LAB — COMPREHENSIVE METABOLIC PANEL
ALBUMIN: 4.8 g/dL (ref 3.5–5.2)
ALK PHOS: 118 U/L — AB (ref 39–117)
ALT: 31 U/L (ref 0–53)
AST: 23 U/L (ref 0–37)
BUN: 19 mg/dL (ref 6–23)
CALCIUM: 9.8 mg/dL (ref 8.4–10.5)
CO2: 32 mEq/L (ref 19–32)
Chloride: 101 mEq/L (ref 96–112)
Creatinine, Ser: 1.12 mg/dL (ref 0.40–1.50)
GFR: 67.16 mL/min (ref >60.00)
Glucose, Bld: 108 mg/dL — ABNORMAL HIGH (ref 70–99)
Potassium: 5.1 mEq/L (ref 3.5–5.1)
SODIUM: 142 meq/L (ref 135–145)
TOTAL PROTEIN: 6.9 g/dL (ref 6.0–8.3)
Total Bilirubin: 0.8 mg/dL (ref 0.2–1.2)

## 2017-12-01 MED ORDER — LEVOFLOXACIN 500 MG PO TABS: 500.0000 mg | ORAL_TABLET | Freq: Every day | ORAL | 0 refills | Status: DC

## 2017-12-01 MED ORDER — METHYLPREDNISOLONE ACETATE 80 MG/ML IJ SUSP
80.0000 mg | Freq: Once | INTRAMUSCULAR | Status: AC
Start: 2017-12-01 — End: 2017-12-01
  Administered 2017-12-01: 80 mg via INTRAMUSCULAR

## 2017-12-01 MED ORDER — PREDNISONE 10 MG PO TABS: ORAL_TABLET | ORAL | 0 refills | Status: DC

## 2017-12-01 NOTE — Progress Notes (Signed)
Patient ID: Richard [name], male   DOB: [birth year], 77 y.o.   MRN: [id number]     Subjective:  I acted as a Education administrator for Dr. Carollee Herter.  [name], [place]   Patient ID: Richard [name], male    DOB: 1941-05-28, 77 y.o.   MRN: [id number]  Chief Complaint  Patient presents with  . Cough    HPI  Patient is in today for follow up cough.  Was seen last Wednesday and now cough is worse.   No fever, no sob. No cp.  + productive cough   Patient Care Team: Carollee Herter, Alferd Apa, DO as PCP - General (Family Medicine) Minus Breeding, MD as PCP - Cardiology (Cardiology) Dingeldein, Remo Lipps, MD (Ophthalmology) Rigoberto Noel, MD as Consulting Physician (Pulmonary Disease) [name], MD as Consulting Physician (Oncology) Minus Breeding, MD as Consulting Physician (Cardiology) Parrett, Fonnie Mu, NP as Nurse Practitioner (Pulmonary Disease) [facility] (Dermatology) Latanya Maudlin, MD as Consulting Physician (Orthopedic Surgery)   Past Medical History:  Diagnosis Date  . Anemia   . Axillary adenopathy 02/25/2017  . Bradycardia    a. holter monitor has demonstrated HRs in 30s and [place]   . CAD (coronary artery disease)    a. s/p CABG 2001  . Chronic lower back pain   . Diffuse large B cell lymphoma ([place])   . Diverticulosis   . Esophageal stricture   . GERD (gastroesophageal reflux disease)   . History of gout   . HTN (hypertension)   . Mixed hyperlipidemia   . OSA on CPAP    with 2L O2 at night  . Pancytopenia ([place])    a. related to chemo therapy for B cell lymphoma  . Peptic stricture of esophagus   . Severe aortic stenosis   . Spinal stenosis   . Type II diabetes mellitus ([place])     Past Surgical History:  Procedure Laterality Date  . APPENDECTOMY  ~ 1952  . BACK SURGERY    . CATARACT EXTRACTION W/PHACO Left 11/06/2016   Procedure: CATARACT EXTRACTION PHACO AND INTRAOCULAR LENS PLACEMENT (IOC);  Surgeon: Estill Cotta, MD;  Location: [facility];  Service:  Ophthalmology;  Laterality: Left;  Lot # [id number] H [place]: 01:09.4 AP%:25.2 CDE: 30.64  . CATARACT EXTRACTION W/PHACO Right 12/04/2016   Procedure: CATARACT EXTRACTION PHACO AND INTRAOCULAR LENS PLACEMENT (IOC);  Surgeon: Estill Cotta, MD;  Location: [facility];  Service: Ophthalmology;  Laterality: Right;  [place] 1:25.9 AP% 24.1 CDE 39.10 Fluid Pack lot # [id number]  . COLONOSCOPY W/ BIOPSIES AND POLYPECTOMY  2013  . CORONARY ANGIOPLASTY  1993  . CORONARY ANGIOPLASTY WITH STENT PLACEMENT  05/1997   "1"  . CORONARY ARTERY BYPASS GRAFT  03/2000   "CABG X5"  . ESOPHAGEAL DILATION  X 3-4   Dr. Lyla Son; "last one was in the 1990's"  . ESOPHAGOGASTRODUODENOSCOPY     multiple  . FLEXIBLE SIGMOIDOSCOPY     multiple  . HYDRADENITIS EXCISION Left 02/25/2017   Procedure: EXCISION DEEP LEFT AXILLARY LYMPH NODE;  Surgeon: Fanny Skates, MD;  Location: [place];  Service: General;  Laterality: Left;  . INTRAOPERATIVE TRANSTHORACIC ECHOCARDIOGRAM N/A 09/09/2017   Procedure: INTRAOPERATIVE TRANSTHORACIC ECHOCARDIOGRAM;  Surgeon: Burnell Blanks, MD;  Location: [place];  Service: Open Heart Surgery;  Laterality: N/A;  . KNEE ARTHROSCOPY Left 2011   meniscus repair  . LEFT HEART CATHETERIZATION WITH CORONARY/GRAFT ANGIOGRAM N/A 03/16/2014   Procedure: LEFT HEART CATHETERIZATION WITH Beatrix Fetters;  Surgeon: Burnell Blanks, MD;  Location: [place] [facility];  Service: Cardiovascular;  Laterality: N/A;  . LUMBAR LAMINECTOMY/DECOMPRESSION MICRODISCECTOMY Right 06/17/2013   Procedure: LUMBAR LAMINECTOMY MICRODISCECTOMY L4-L5 RIGHT EXCISION OF SYNOVIAL CYST RIGHT   (1 LEVEL) RIGHT PARTIAL FACETECTOMY;  Surgeon: Tobi Bastos, MD;  Location: [facility];  Service: Orthopedics;  Laterality: Right;  . MYELOGRAM  04/06/13   lumbar, Dr Gladstone Lighter  . PACEMAKER IMPLANT N/A 09/10/2017   Procedure: PACEMAKER IMPLANT;  Surgeon: Deboraha Sprang, MD;  Location: [place] [facility];  Service: Cardiovascular;   Laterality: N/A;  . PANENDOSCOPY    . PORTACATH PLACEMENT N/A 03/06/2017   Procedure: INSERTION PORT-A-CATH AND ASPIRATE SEROMA LEFT AXILLA;  Surgeon: Fanny Skates, MD;  Location: [place];  Service: General;  Laterality: N/A;  . RIGHT/LEFT HEART CATH AND CORONARY/GRAFT ANGIOGRAPHY N/A 07/28/2017   Procedure: RIGHT/LEFT HEART CATH AND CORONARY/GRAFT ANGIOGRAPHY;  Surgeon: Sherren Mocha, MD;  Location: [place] [facility];  Service: Cardiovascular;  Laterality: N/A;  . SHOULDER SURGERY Right 08/2010   screws placed; "tendons tore off"  . SKIN CANCER EXCISION Right    "neck"  . TONSILLECTOMY  ~ 1954  . TRANSCATHETER AORTIC VALVE REPLACEMENT, TRANSFEMORAL N/A 09/09/2017   Procedure: TRANSCATHETER AORTIC VALVE REPLACEMENT, TRANSFEMORAL;  Surgeon: Burnell Blanks, MD;  Location: [place];  Service: Open Heart Surgery;  Laterality: N/A;  . UPPER GI ENDOSCOPY  2013   Gastritis; Dr Carlean Purl  . VASECTOMY      Family History  Problem Relation Age of Onset  . Stroke Father   . Hypertension Father   . Pancreatic cancer Mother   . Diabetes Maternal Grandmother   . Stroke Maternal Grandmother   . Heart attack Paternal Grandmother   . Colon cancer Neg Hx   . Esophageal cancer Neg Hx   . Rectal cancer Neg Hx   . Stomach cancer Neg Hx   . Ulcers Neg Hx     Social History   Socioeconomic History  . Marital status: Divorced    Spouse name: Not on file  . Number of children: 2  . Years of education: Not on file  . Highest education level: Not on file  Occupational History    Employer: RETIRED  Social Needs  . Financial resource strain: Not on file  . Food insecurity:    Worry: Not on file    Inability: Not on file  . Transportation needs:    Medical: Not on file    Non-medical: Not on file  Tobacco Use  . Smoking status: Never Smoker  . Smokeless tobacco: Never Used  Substance and Sexual Activity  . Alcohol use: No    Alcohol/week: 0.0 oz    Comment: "last drink was in 2012"(  03/15/2014)  . Drug use: No  . Sexual activity: Not Currently  Lifestyle  . Physical activity:    Days per week: Not on file    Minutes per session: Not on file  . Stress: Not on file  Relationships  . Social connections:    Talks on phone: Not on file    Gets together: Not on file    Attends religious service: Not on file    Active member of club or organization: Not on file    Attends meetings of clubs or organizations: Not on file    Relationship status: Not on file  . Intimate partner violence:    Fear of current or ex partner: Not on file    Emotionally abused: Not on file  Physically abused: Not on file    Forced sexual activity: Not on file  Other Topics Concern  . Not on file  Social History Narrative   Divorced, lives with a roommate.   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    Outpatient Medications Prior to Visit  Medication Sig Dispense Refill  . allopurinol (ZYLOPRIM) 300 MG tablet Take 450 mg by mouth daily.     [name] amLODipine (NORVASC) 5 MG tablet TAKE 2 TABLETS (10 MG TOTAL) BY MOUTH DAILY. (Patient taking differently: Take 1 tablet BID) 60 tablet 11  . apixaban (ELIQUIS) 5 MG TABS tablet Take 1 tablet (5 mg total) by mouth 2 (two) times daily. 60 tablet 1  . aspirin 81 MG tablet Take 81 mg by mouth daily.    [name] atorvastatin (LIPITOR) 80 MG tablet TAKE ONE TABLET BY MOUTH AT BEDTIME 90 tablet 3  . benzonatate (TESSALON) 100 MG capsule Take 1 capsule (100 mg total) by mouth 3 (three) times daily as needed for cough. 30 capsule 0  . esomeprazole (NEXIUM) 40 MG capsule Take 1 capsule (40 mg total) by mouth daily. 30 capsule 5  . ezetimibe (ZETIA) 10 MG tablet TAKE ONE TABLET BY MOUTH DAILY 90 tablet 3  . famciclovir (FAMVIR) 500 MG tablet Take 1 tablet (500 mg total) by mouth daily. 30 tablet  12  . fluticasone (FLONASE) 50 MCG/ACT nasal spray INHALE ONE SPRAY INTO EACH NOSTRIL TWICE DAILY. 16 g 6  . furosemide (LASIX) 40 MG tablet Take 120 mg by mouth 2 (two) times daily.    [name] glimepiride (AMARYL) 2 MG tablet TAKE 1 TABLET (2 MG TOTAL) BY MOUTH DAILY WITH BREAKFAST. 90 tablet 0  . glucose blood (FREESTYLE LITE) test strip TEST BLOOD SUGAR bid 100 each 11  . Lancets (FREESTYLE) lancets Use BID to check blood sugar.  DX E11.9 100 each 6  . lipase/protease/amylase (CREON) 36000 UNITS CPEP capsule Take 2 capsules (72,000 Units total) by mouth 3 (three) times daily with meals. 1 capsule with snack. 210 capsule 2  . LYRICA 75 MG capsule TAKE 1 CAPSULE BY MOUTH THREE TIMES A DAY 270 capsule 1  . METFORMIN HCL PO Take 40 mg by mouth 2 (two) times daily with a meal. Pt takes 1.5 tablets in the AM and 1 tablet in the PM    . nitroGLYCERIN (NITROSTAT) 0.4 MG SL tablet Place 1 tablet (0.4 mg total) under the tongue every 5 (five) minutes as needed for chest pain. 25 tablet 3  . polycarbophil (FIBERCON) 625 MG tablet Take 625 mg by mouth daily.    . potassium chloride SA (K-DUR,KLOR-CON) 20 MEQ tablet Take 40 mEq by mouth 2 (two) times daily.    . psyllium (METAMUCIL) 58.6 % powder Take 1 packet by mouth 3 (three) times daily.    [name] azithromycin (ZITHROMAX) 250 MG tablet Take 2 tablets by mouth on day 1, followed by 1 tablet by mouth daily for 4 days. 6 tablet 0  . cephALEXin (KEFLEX) 500 MG capsule Take 1 capsule (500 mg total) by mouth 2 (two) times daily. 20 capsule 0   Facility-Administered Medications Prior to Visit  Medication Dose Route Frequency Provider Last Rate Last Dose  . sodium chloride flush (NS) 0.9 % injection 10 mL  10 mL Intravenous PRN Burney Gauze  R, MD   10 mL at 05/30/17 0920    Allergies  Allergen Reactions  . Benazepril Swelling    angioedema; he is not a candidate for any angiotensin receptor blockers because of this significant allergic reaction. Because of a  history of documented adverse serious drug reaction;Medi Alert bracelet  is recommended  . Hctz [Hydrochlorothiazide] Anaphylaxis and Swelling    Tongue and lip swelling   . Aspirin Other (See Comments)    Gastritis, cant take 325 Mg aspirin   . Lactose Intolerance (Gi) Nausea And Vomiting    Review of Systems  Constitutional: Negative for fever and malaise/fatigue.  HENT: Negative for congestion.        Nasal drainage   Eyes: Negative for blurred vision.  Respiratory: Positive for cough and wheezing. Negative for shortness of breath.   Cardiovascular: Negative for chest pain, palpitations and leg swelling.  Gastrointestinal: Negative for vomiting.  Musculoskeletal: Negative for back pain.  Skin: Negative for rash.  Neurological: Negative for loss of consciousness and headaches.       Objective:    Physical Exam  Constitutional: He is oriented to person, place, and time. He appears well-developed and well-nourished.  HENT:  Right Ear: External ear normal.  Left Ear: External ear normal.  + PND + errythema  Eyes: Conjunctivae are normal. Right eye exhibits no discharge. Left eye exhibits no discharge.  Cardiovascular: Normal rate, regular rhythm and normal heart sounds.  No murmur heard. Pulmonary/Chest: Effort normal. No respiratory distress. He has wheezes. He has rhonchi. He has no rales. He exhibits no tenderness.  Musculoskeletal: He exhibits no edema.  Lymphadenopathy:    He has cervical adenopathy.  Neurological: He is alert and oriented to person, place, and time.    BP 118/66 (BP Location: Right Arm, Cuff Size: Large)   Pulse 76   Temp 97.9 F (36.6 C) (Oral)   Resp 16   Ht 6' (1.829 m)   Wt 240 lb 3.2 oz (109 kg)   SpO2 95%   BMI 32.58 kg/m  Wt Readings from Last 3 Encounters:  12/01/17 240 lb 3.2 oz (109 kg)  11/26/17 241 lb 12.8 oz (109.7 kg)  11/07/17 248 lb (112.5 kg)   BP Readings from Last 3 Encounters:  12/01/17 118/66  11/26/17 (!) 123/59    11/15/17 135/70     Immunization History  Administered Date(s) Administered  . Influenza Split 06/12/2011, 05/31/2013  . Influenza Whole 05/01/2012  . Influenza, High Dose Seasonal PF 02/09/2015, 03/12/2017  . Influenza-Unspecified 02/11/2014, 02/29/2016  . Pneumococcal Conjugate-13 09/18/2015  . Pneumococcal Polysaccharide-23 06/20/2013  . Td 03/20/2010, 03/16/2013, 09/29/2017  . Zoster Recombinat (Shingrix) 11/21/2016    Health Maintenance  Topic Date Due  . URINE MICROALBUMIN  03/15/2017  . OPHTHALMOLOGY EXAM  10/16/2017  . FOOT EXAM  11/21/2017  . INFLUENZA VACCINE  01/29/2018  . COLONOSCOPY  02/19/2018  . HEMOGLOBIN A1C  03/08/2018  . TETANUS/TDAP  09/30/2027  . PNA vac Low Risk Adult  Completed    Lab Results  Component Value Date   WBC 3.8 (L) 12/01/2017   HGB 13.0 12/01/2017   HCT 39.9 12/01/2017   PLT 103.0 (L) 12/01/2017   GLUCOSE 108 (H) 12/01/2017   CHOL 111 08/05/2017   TRIG 218.0 (H) 08/05/2017   HDL 27.30 (L) 08/05/2017   LDLDIRECT 51.0 08/05/2017   LDLCALC 57 11/21/2016   ALT 31 12/01/2017   AST 23 12/01/2017   NA 142 12/01/2017   K 5.1 12/01/2017  CL 101 12/01/2017   CREATININE 1.12 12/01/2017   BUN 19 12/01/2017   CO2 32 12/01/2017   TSH 2.101 08/18/2017   PSA 0.65 09/27/2013   INR 1.15 11/15/2017   HGBA1C 5.4 09/05/2017   MICROALBUR 0.4 03/15/2016    Lab Results  Component Value Date   TSH 2.101 08/18/2017   Lab Results  Component Value Date   WBC 3.8 (L) 12/01/2017   HGB 13.0 12/01/2017   HCT 39.9 12/01/2017   MCV 92.3 12/01/2017   PLT 103.0 (L) 12/01/2017   Lab Results  Component Value Date   NA 142 12/01/2017   K 5.1 12/01/2017   CO2 32 12/01/2017   GLUCOSE 108 (H) 12/01/2017   BUN 19 12/01/2017   CREATININE 1.12 12/01/2017   BILITOT 0.8 12/01/2017   ALKPHOS 118 (H) 12/01/2017   AST 23 12/01/2017   ALT 31 12/01/2017   PROT 6.9 12/01/2017   ALBUMIN 4.8 12/01/2017   CALCIUM 9.8 12/01/2017   ANIONGAP 15 11/15/2017    GFR 67.16 12/01/2017   Lab Results  Component Value Date   CHOL 111 08/05/2017   Lab Results  Component Value Date   HDL 27.30 (L) 08/05/2017   Lab Results  Component Value Date   LDLCALC 57 11/21/2016   Lab Results  Component Value Date   TRIG 218.0 (H) 08/05/2017   Lab Results  Component Value Date   CHOLHDL 4 08/05/2017   Lab Results  Component Value Date   HGBA1C 5.4 09/05/2017         Assessment & Plan:   Problem List Items Addressed This Visit    None    Visit Diagnoses    Bronchitis    -  Primary   Relevant Medications   levofloxacin (LEVAQUIN) 500 MG tablet   predniSONE (DELTASONE) 10 MG tablet   methylPREDNISolone acetate (DEPO-MEDROL) injection 80 mg (Completed)   Other Relevant Orders   DG Chest 2 View (Completed)   CBC with Differential/Platelet (Completed)   Comprehensive metabolic panel (Completed)      I have discontinued Pilar Plate L. Smolinsky's cephALEXin and azithromycin. I am also having him start on levofloxacin and predniSONE. Additionally, I am having him maintain his allopurinol, esomeprazole, aspirin, nitroGLYCERIN, ezetimibe, amLODipine, atorvastatin, polycarbophil, famciclovir, freestyle, glucose blood, fluticasone, lipase/protease/amylase, furosemide, potassium chloride SA, METFORMIN HCL PO, psyllium, apixaban, LYRICA, glimepiride, and benzonatate. We administered methylPREDNISolone acetate.  Meds ordered this encounter  Medications  . levofloxacin (LEVAQUIN) 500 MG tablet    Sig: Take 1 tablet (500 mg total) by mouth daily.    Dispense:  7 tablet    Refill:  0  . predniSONE (DELTASONE) 10 MG tablet    Sig: TAKE 3 TABLETS PO QD FOR 3 DAYS THEN TAKE 2 TABLETS PO QD FOR 3 DAYS THEN TAKE 1 TABLET PO QD FOR 3 DAYS THEN TAKE 1/2 TAB PO QD FOR 3 DAYS    Dispense:  20 tablet    Refill:  0  . methylPREDNISolone acetate (DEPO-MEDROL) injection 80 mg    CMA served as Education administrator during this visit. History, Physical and Plan performed by medical  provider. Documentation and orders reviewed and attested to.  [name], DO

## 2017-12-01 NOTE — Patient Instructions (Signed)

## 2017-12-04 ENCOUNTER — Ambulatory Visit: Payer: Medicare Other

## 2017-12-04 ENCOUNTER — Encounter: Payer: Self-pay | Admitting: Thoracic Surgery (Cardiothoracic Vascular Surgery)

## 2017-12-09 ENCOUNTER — Ambulatory Visit (INDEPENDENT_AMBULATORY_CARE_PROVIDER_SITE_OTHER): Payer: Medicare Other | Admitting: Pulmonary Disease

## 2017-12-09 ENCOUNTER — Encounter: Payer: Self-pay | Admitting: Pulmonary Disease

## 2017-12-09 DIAGNOSIS — G4733 Obstructive sleep apnea (adult) (pediatric): Secondary | ICD-10-CM | POA: Diagnosis not present

## 2017-12-09 DIAGNOSIS — I248 Other forms of acute ischemic heart disease: Secondary | ICD-10-CM | POA: Diagnosis not present

## 2017-12-09 NOTE — Patient Instructions (Signed)
Your CPAP machine is set at 15 cm and seems to be working well.

## 2017-12-09 NOTE — Progress Notes (Signed)
   Subjective:    Patient ID: Richard [name], male    DOB: [birth year], 77 y.o.   MRN: [id number]  HPI  77 year old male For FU of obstructive sleep apnea. He got a new CPAP machine in 10/2015  He was diagnosed with diffuse large B-cell lymphoma and underwent chemotherapy (ennever) , now in remission He had  severe aortic stenosis s/p TAVR  & PPM. He seems to have done well with this procedure, pedal edema is resolved. He had an episode of acute bronchitis and has been on prednisone for the last week which has made him hyperactive and disturbed his sleep somewhat. His nasal pillows seem to last about 3 weeks. No problems with pressure, feels rested otherwise without sleep pressure in the afternoons. CPAP download from 09/2017 shows good control of events with residual AHI 5/hour and moderate leak with excellent compliance  He has lost about 35 pounds to his current weight of 236   Significant tests/ events 2008 PSGRDI 48/h ONO on CPAP /RA 08/2014 >> no desatn - dc O2  Review of Systems Pt denies any significant  nasal congestion or excess secretions, fever, chills, sweats, unintended wt loss, pleuritic or exertional cp, orthopnea pnd or leg swelling.  Pt also denies any obvious fluctuation in symptoms with weather or environmental change or other alleviating or aggravating factors.         Objective:   Physical Exam   Gen. Pleasant, obese, in no distress ENT - no lesions, no post nasal drip Neck: No JVD, no thyromegaly, no carotid bruits Lungs: no use of accessory muscles, no dullness to percussion, decreased without rales or rhonchi  Cardiovascular: Rhythm regular, heart sounds  normal, no murmurs or gallops, no peripheral edema Musculoskeletal: No deformities, no cyanosis or clubbing , no tremors        Assessment & Plan:

## 2017-12-09 NOTE — Assessment & Plan Note (Signed)
CPAP is set at 15 cm and seems to work well CPAP supplies will be renewed for a year  Weight loss encouraged, compliance with goal of at least 4-6 hrs every night is the expectation. Advised against medications with sedative side effects Cautioned against driving when sleepy - understanding that sleepiness will vary on a day to day basis

## 2017-12-10 ENCOUNTER — Other Ambulatory Visit: Payer: Self-pay | Admitting: Physician Assistant

## 2017-12-20 ENCOUNTER — Other Ambulatory Visit: Payer: Self-pay | Admitting: Cardiology

## 2017-12-22 ENCOUNTER — Other Ambulatory Visit: Payer: Self-pay | Admitting: Family Medicine

## 2017-12-22 NOTE — Telephone Encounter (Signed)
Rx request sent to pharmacy.  

## 2017-12-24 ENCOUNTER — Telehealth: Payer: Self-pay | Admitting: *Deleted

## 2017-12-24 NOTE — Telephone Encounter (Signed)
Copied from [place] [id number]. Topic: Inquiry >> Dec 23, 2017 10:25 AM Lennox Solders wrote: Reason for CRM:pt is calling and would like more samples of eliquis

## 2017-12-24 NOTE — Telephone Encounter (Signed)
Do you want him to contact specialist?

## 2017-12-25 NOTE — Telephone Encounter (Signed)
Ok to give samples 5 mg

## 2017-12-25 NOTE — Telephone Encounter (Signed)
Patient came to pickup samples

## 2017-12-29 ENCOUNTER — Other Ambulatory Visit: Payer: Self-pay

## 2017-12-29 ENCOUNTER — Ambulatory Visit: Payer: Self-pay | Admitting: *Deleted

## 2017-12-29 NOTE — Telephone Encounter (Signed)
Patient reports he started Eliquis in March and he has superficial bruising on his arms.   Reason for Disposition . Caller has NON-URGENT medication question about med that PCP prescribed and triager unable to answer question  Answer Assessment - Initial Assessment Questions 1. SYMPTOMS: "Do you have any symptoms?"     superficial bruising on arm- bilateral 2. SEVERITY: If symptoms are present, ask "Are they mild, moderate or severe?"     severe  Protocols used: MEDICATION QUESTION CALL-A-AH  Patient also was treated for bronchitis twice. Since taking the medication- within 2-3 days he has lost all since of taste . He has finished the medication and still can not taste anything.

## 2017-12-30 ENCOUNTER — Ambulatory Visit (INDEPENDENT_AMBULATORY_CARE_PROVIDER_SITE_OTHER): Payer: Medicare Other | Admitting: Internal Medicine

## 2017-12-30 ENCOUNTER — Encounter: Payer: Self-pay | Admitting: Internal Medicine

## 2017-12-30 VITALS — BP 132/68 | HR 68 | Temp 98.3°F | Resp 16 | Ht 72.0 in | Wt 242.4 lb

## 2017-12-30 DIAGNOSIS — I248 Other forms of acute ischemic heart disease: Secondary | ICD-10-CM

## 2017-12-30 DIAGNOSIS — R238 Other skin changes: Secondary | ICD-10-CM

## 2017-12-30 DIAGNOSIS — R233 Spontaneous ecchymoses: Secondary | ICD-10-CM

## 2017-12-30 LAB — CBC WITH DIFFERENTIAL/PLATELET
Basophils Absolute: 0 10*3/uL (ref 0.0–0.1)
Basophils Relative: 1.1 % (ref 0.0–3.0)
EOS ABS: 0.1 10*3/uL (ref 0.0–0.7)
Eosinophils Relative: 2.9 % (ref 0.0–5.0)
HCT: 37.4 % — ABNORMAL LOW (ref 39.0–52.0)
Hemoglobin: 12.5 g/dL — ABNORMAL LOW (ref 13.0–17.0)
LYMPHS ABS: 0.8 10*3/uL (ref 0.7–4.0)
Lymphocytes Relative: 44.7 % (ref 12.0–46.0)
MCHC: 33.5 g/dL (ref 30.0–36.0)
MCV: 91 fl (ref 78.0–100.0)
MONO ABS: 0.3 10*3/uL (ref 0.1–1.0)
MONOS PCT: 16.5 % — AB (ref 3.0–12.0)
NEUTROS ABS: 0.7 10*3/uL — AB (ref 1.4–7.7)
NEUTROS PCT: 34.8 % — AB (ref 43.0–77.0)
PLATELETS: 80 10*3/uL — AB (ref 150.0–400.0)
RBC: 4.11 Mil/uL — ABNORMAL LOW (ref 4.22–5.81)
RDW: 19.7 % — ABNORMAL HIGH (ref 11.5–15.5)
WBC: 2 10*3/uL — ABNORMAL LOW (ref 4.0–10.5)

## 2017-12-30 NOTE — Patient Instructions (Signed)
Please get your blood work  Continue the same medications  Call or go to the ER if: You have stomach pain, nausea, change in the color of the stools. Also if you see blood in the urine or you cough up blood Also if you have severe or increased bruising.

## 2017-12-30 NOTE — Progress Notes (Signed)
Subjective:    Patient ID: Richard [name], male    DOB: 1940-09-14, 77 y.o.   MRN: [id number]  DOS:  12/30/2017 Type of visit - description : Acute visit Interval history:  The patient went to play golf 5 days ago, while he was playing he developed a bruise on the right arm, it spread down towards  his hand.  He also noted a superficial lump near the right elbow, see picture. Left arm had the same problem few weeks ago and the bruising has not increased. Also had a bronchitis, was treated with antibiotics, since then he has not recover his sense of smell and taste.  Review of Systems  No fever chills No nausea, vomiting, diarrhea. Bowel movements normal, no change in the color of the stools No cough or difficulty breathing No hemoptysis No hematuria Past Medical History:  Diagnosis Date  . Anemia   . Axillary adenopathy 02/25/2017  . Bradycardia    a. holter monitor has demonstrated HRs in 30s and Weinkibach   . CAD (coronary artery disease)    a. s/p CABG 2001  . Chronic lower back pain   . Diffuse large B cell lymphoma ([place])   . Diverticulosis   . Esophageal stricture   . GERD (gastroesophageal reflux disease)   . History of gout   . HTN (hypertension)   . Mixed hyperlipidemia   . OSA on CPAP    with 2L O2 at night  . Pancytopenia ([place])    a. related to chemo therapy for B cell lymphoma  . Peptic stricture of esophagus   . Severe aortic stenosis   . Spinal stenosis   . Type II diabetes mellitus ([place])     Past Surgical History:  Procedure Laterality Date  . APPENDECTOMY  ~ 1952  . BACK SURGERY    . CATARACT EXTRACTION W/PHACO Left 11/06/2016   Procedure: CATARACT EXTRACTION PHACO AND INTRAOCULAR LENS PLACEMENT (IOC);  Surgeon: Estill Cotta, MD;  Location: [facility];  Service: Ophthalmology;  Laterality: Left;  Lot # [id number] H [place]: 01:09.4 AP%:25.2 CDE: 30.64  . CATARACT EXTRACTION W/PHACO Right 12/04/2016   Procedure: CATARACT EXTRACTION PHACO AND INTRAOCULAR  LENS PLACEMENT (IOC);  Surgeon: Estill Cotta, MD;  Location: [facility];  Service: Ophthalmology;  Laterality: Right;  [place] 1:25.9 AP% 24.1 CDE 39.10 Fluid Pack lot # [id number]  . COLONOSCOPY W/ BIOPSIES AND POLYPECTOMY  2013  . CORONARY ANGIOPLASTY  1993  . CORONARY ANGIOPLASTY WITH STENT PLACEMENT  05/1997   "1"  . CORONARY ARTERY BYPASS GRAFT  03/2000   "CABG X5"  . ESOPHAGEAL DILATION  X 3-4   Dr. Lyla Son; "last one was in the 1990's"  . ESOPHAGOGASTRODUODENOSCOPY     multiple  . FLEXIBLE SIGMOIDOSCOPY     multiple  . HYDRADENITIS EXCISION Left 02/25/2017   Procedure: EXCISION DEEP LEFT AXILLARY LYMPH NODE;  Surgeon: Fanny Skates, MD;  Location: [place];  Service: General;  Laterality: Left;  . INTRAOPERATIVE TRANSTHORACIC ECHOCARDIOGRAM N/A 09/09/2017   Procedure: INTRAOPERATIVE TRANSTHORACIC ECHOCARDIOGRAM;  Surgeon: Burnell Blanks, MD;  Location: [place];  Service: Open Heart Surgery;  Laterality: N/A;  . KNEE ARTHROSCOPY Left 2011   meniscus repair  . LEFT HEART CATHETERIZATION WITH CORONARY/GRAFT ANGIOGRAM N/A 03/16/2014   Procedure: LEFT HEART CATHETERIZATION WITH Beatrix Fetters;  Surgeon: Burnell Blanks, MD;  Location: [facility] CATH LAB;  Service: Cardiovascular;  Laterality: N/A;  . LUMBAR LAMINECTOMY/DECOMPRESSION MICRODISCECTOMY Right 06/17/2013   Procedure: LUMBAR LAMINECTOMY MICRODISCECTOMY L4-L5 RIGHT EXCISION  OF SYNOVIAL CYST RIGHT   (1 LEVEL) RIGHT PARTIAL FACETECTOMY;  Surgeon: Tobi Bastos, MD;  [facility];  Service: Orthopedics;  Laterality: Right;  . MYELOGRAM  04/06/13   lumbar, Dr Gladstone Lighter  . PACEMAKER IMPLANT N/A 09/10/2017   Procedure: PACEMAKER IMPLANT;  Surgeon: Deboraha Sprang, MD;  Location: [place] CV LAB;  Service: Cardiovascular;  Laterality: N/A;  . PANENDOSCOPY    . PORTACATH PLACEMENT N/A 03/06/2017   Procedure: INSERTION PORT-A-CATH AND ASPIRATE SEROMA LEFT AXILLA;  Surgeon: Fanny Skates, MD;  Location: [place];   Service: General;  Laterality: N/A;  . RIGHT/LEFT HEART CATH AND CORONARY/GRAFT ANGIOGRAPHY N/A 07/28/2017   Procedure: RIGHT/LEFT HEART CATH AND CORONARY/GRAFT ANGIOGRAPHY;  Surgeon: Sherren Mocha, MD;  Location: [place] CV LAB;  Service: Cardiovascular;  Laterality: N/A;  . SHOULDER SURGERY Right 08/2010   screws placed; "tendons tore off"  . SKIN CANCER EXCISION Right    "neck"  . TONSILLECTOMY  ~ 1954  . TRANSCATHETER AORTIC VALVE REPLACEMENT, TRANSFEMORAL N/A 09/09/2017   Procedure: TRANSCATHETER AORTIC VALVE REPLACEMENT, TRANSFEMORAL;  Surgeon: Burnell Blanks, MD;  Location: [place];  Service: Open Heart Surgery;  Laterality: N/A;  . UPPER GI ENDOSCOPY  2013   Gastritis; Dr Carlean Purl  . VASECTOMY      Social History   Socioeconomic History  . Marital status: Divorced    Spouse name: Not on file  . Number of children: 2  . Years of education: Not on file  . Highest education level: Not on file  Occupational History    Employer: RETIRED  Social Needs  . Financial resource strain: Not on file  . Food insecurity:    Worry: Not on file    Inability: Not on file  . Transportation needs:    Medical: Not on file    Non-medical: Not on file  Tobacco Use  . Smoking status: Never Smoker  . Smokeless tobacco: Never Used  Substance and Sexual Activity  . Alcohol use: No    Alcohol/week: 0.0 oz    Comment: "last drink was in 2012"( 03/15/2014)  . Drug use: No  . Sexual activity: Not Currently  Lifestyle  . Physical activity:    Days per week: Not on file    Minutes per session: Not on file  . Stress: Not on file  Relationships  . Social connections:    Talks on phone: Not on file    Gets together: Not on file    Attends religious service: Not on file    Active member of club or organization: Not on file    Attends meetings of clubs or organizations: Not on file    Relationship status: Not on file  . Intimate partner violence:    Fear of current or ex partner:  Not on file    Emotionally abused: Not on file    Physically abused: Not on file    Forced sexual activity: Not on file  Other Topics Concern  . Not on file  Social History Narrative   Divorced, lives with a roommate.   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      Allergies as of 12/30/2017      Reactions   Benazepril Swelling   angioedema; he is not a candidate for any angiotensin receptor blockers  because of this significant allergic reaction. Because of a history of documented adverse serious drug reaction;Medi Alert bracelet  is recommended   Hctz [hydrochlorothiazide] Anaphylaxis, Swelling   Tongue and lip swelling   Aspirin Other (See Comments)   Gastritis, cant take 325 Mg aspirin    Lactose Intolerance (gi) Nausea And Vomiting      Medication List        Accurate as of 12/30/17 11:59 PM. Always use your most recent med list.          allopurinol 300 MG tablet Commonly known as:  ZYLOPRIM Take 450 mg by mouth daily.   amLODipine 5 MG tablet Commonly known as:  NORVASC TAKE 2 TABLETS (10 MG TOTAL) BY MOUTH DAILY.   apixaban 5 MG Tabs tablet Commonly known as:  ELIQUIS Take 1 tablet (5 mg total) by mouth 2 (two) times daily.   aspirin 81 MG tablet Take 81 mg by mouth daily.   atorvastatin 80 MG tablet Commonly known as:  LIPITOR TAKE ONE TABLET BY MOUTH AT BEDTIME   esomeprazole 40 MG capsule Commonly known as:  NEXIUM Take 1 capsule (40 mg total) by mouth daily.   ezetimibe 10 MG tablet Commonly known as:  ZETIA TAKE 1 TABLET BY MOUTH EVERY DAY   famciclovir 500 MG tablet Commonly known as:  FAMVIR Take 1 tablet (500 mg total) by mouth daily.   fluticasone 50 MCG/ACT nasal spray Commonly known as:  FLONASE INHALE ONE SPRAY INTO EACH NOSTRIL TWICE DAILY.   freestyle  lancets Use BID to check blood sugar.  DX E11.9   furosemide 40 MG tablet Commonly known as:  LASIX TAKE 3 TABLETS (120 MG TOTAL) 2 (TWO) TIMES DAILY BY MOUTH.   glimepiride 2 MG tablet Commonly known as:  AMARYL TAKE 1 TABLET (2 MG TOTAL) BY MOUTH DAILY WITH BREAKFAST.   glucose blood test strip Commonly known as:  FREESTYLE LITE TEST BLOOD SUGAR bid   lipase/protease/amylase 36000 UNITS Cpep capsule Commonly known as:  CREON Take 2 capsules (72,000 Units total) by mouth 3 (three) times daily with meals. 1 capsule with snack.   LYRICA 75 MG capsule Generic drug:  pregabalin TAKE 1 CAPSULE BY MOUTH THREE TIMES A DAY   METFORMIN HCL PO Take 40 mg by mouth 2 (two) times daily with a meal. Pt takes 1.5 tablets in the AM and 1 tablet in the PM   metFORMIN 1000 MG tablet Commonly known as:  GLUCOPHAGE TAKE ONE AND ONE-HALF TABLETS BY MOUTH EVERY MORNING AND 1 TALBET IN THE EVENING.   nitroGLYCERIN 0.4 MG SL tablet Commonly known as:  NITROSTAT Place 1 tablet (0.4 mg total) under the tongue every 5 (five) minutes as needed for chest pain.   polycarbophil 625 MG tablet Commonly known as:  FIBERCON Take 625 mg by mouth daily.   potassium chloride SA 20 MEQ tablet Commonly known as:  K-DUR,KLOR-CON Take 40 mEq by mouth 2 (two) times daily.   KLOR-CON M20 20 MEQ tablet Generic drug:  potassium chloride SA TAKE 2 TABLETS TWICE A DAY   psyllium 58.6 % powder Commonly known as:  METAMUCIL Take 1 packet by mouth 3 (three) times daily.          Objective:   Physical Exam BP 132/68 (BP Location: Left Arm, Patient Position: Sitting, Cuff Size: Normal)   Pulse 68   Temp 98.3 F (36.8 C) (Oral)   Resp 16   Ht 6' (1.829 m)   Wt 242 lb  6 oz (109.9 kg)   SpO2 98%   BMI 32.87 kg/m  General:   Well developed, NAD, see BMI.  HEENT:  Normocephalic . Face symmetric, atraumatic Lungs:  CTA B Normal respiratory effort, no intercostal retractions, no accessory muscle  use. Heart: RRR, soft systolic murmur    No pretibial edema bilaterally  Skin:  Bruising in both arms, see pictures, he has a small very superficial subcu nodule just distal from the right elbow.  No active bleeding. Neurologic:  alert & oriented X3.  Speech normal, gait appropriate for age and unassisted Psych--  Cognition and judgment appear intact.  Cooperative with normal attention span and concentration.  Behavior appropriate. No anxious or depressed appearing.          Assessment & Plan:    77 year old gentleman, PMH includes DM, B12 deficiency, pernicious anemia, iron deficiency, CAD, AoS s/p TVAR,, GERD, gout, HTN, hyperlipidemia, sleep apnea, presents with the following:  Easy bruising: Patient with multiple medical problems who takes aspirin on Eliquis presents with easy bruising most recent one on the right arm in the context of playing golf.  Denies any injury. ROS negative for internal or severe bleeding. I talked with the patient about the need to continue taking the same medication and at the same times avoid any trauma or injury. I asked him to be alert for signs of symptoms of a GI or other bleeds and he verbalized understanding. To be sure that there he had no major bleeding will check a CBC.

## 2017-12-30 NOTE — Progress Notes (Signed)
Pre visit review using our clinic review tool, if applicable. No additional management support is needed unless otherwise documented below in the visit note. 

## 2018-01-02 ENCOUNTER — Telehealth: Payer: Self-pay | Admitting: Family Medicine

## 2018-01-02 ENCOUNTER — Inpatient Hospital Stay: Payer: Medicare Other | Attending: Hematology & Oncology

## 2018-01-02 ENCOUNTER — Other Ambulatory Visit: Payer: Self-pay

## 2018-01-02 VITALS — BP 138/70 | HR 69 | Temp 97.9°F | Resp 18

## 2018-01-02 DIAGNOSIS — D510 Vitamin B12 deficiency anemia due to intrinsic factor deficiency: Secondary | ICD-10-CM | POA: Insufficient documentation

## 2018-01-02 DIAGNOSIS — D508 Other iron deficiency anemias: Secondary | ICD-10-CM

## 2018-01-02 DIAGNOSIS — D519 Vitamin B12 deficiency anemia, unspecified: Secondary | ICD-10-CM

## 2018-01-02 MED ORDER — CYANOCOBALAMIN 1000 MCG/ML IJ SOLN
INTRAMUSCULAR | Status: AC
  Filled 2018-01-02: qty 1

## 2018-01-02 MED ORDER — CYANOCOBALAMIN 1000 MCG/ML IJ SOLN
1000.0000 ug | Freq: Once | INTRAMUSCULAR | Status: AC
  Administered 2018-01-02: 1000 ug via INTRAMUSCULAR

## 2018-01-02 NOTE — Telephone Encounter (Signed)
Patient notified of labs.   

## 2018-01-02 NOTE — Telephone Encounter (Signed)
Copied from [place] [id number]. Topic: Quick Communication - See Telephone Encounter >> Jan 02, 2018  1:59 PM Rosalin Hawking wrote: CRM for notification. See Telephone encounter for: 01/02/18.   Pt came in office stating would like to be called to get his last lab results, pt states do not send it on mychart, that he would like to speak with someone for his last lab results. Please advise ASAP.

## 2018-01-02 NOTE — Patient Instructions (Signed)
Cyanocobalamin, Vitamin B12 injection What is this medicine? CYANOCOBALAMIN (sye an oh koe BAL a min) is a man made form of vitamin B12. Vitamin B12 is used in the growth of healthy blood cells, nerve cells, and proteins in the body. It also helps with the metabolism of fats and carbohydrates. This medicine is used to treat people who can not absorb vitamin B12. This medicine may be used for other purposes; ask your health care provider or pharmacist if you have questions. COMMON BRAND NAME(S): B-12 Compliance Kit, B-12 Injection Kit, Cyomin, LA-12, Nutri-Twelve, Physicians EZ Use B-12, Primabalt What should I tell my health care provider before I take this medicine? They need to know if you have any of these conditions: -kidney disease -Leber's disease -megaloblastic anemia -an unusual or allergic reaction to cyanocobalamin, cobalt, other medicines, foods, dyes, or preservatives -pregnant or trying to get pregnant -breast-feeding How should I use this medicine? This medicine is injected into a muscle or deeply under the skin. It is usually given by a health care professional in a clinic or doctor's office. However, your doctor may teach you how to inject yourself. Follow all instructions. Talk to your pediatrician regarding the use of this medicine in children. Special care may be needed. Overdosage: If you think you have taken too much of this medicine contact a poison control center or emergency room at once. NOTE: This medicine is only for you. Do not share this medicine with others. What if I miss a dose? If you are given your dose at a clinic or doctor's office, call to reschedule your appointment. If you give your own injections and you miss a dose, take it as soon as you can. If it is almost time for your next dose, take only that dose. Do not take double or extra doses. What may interact with this medicine? -colchicine -heavy alcohol intake This list may not describe all possible  interactions. Give your health care provider a list of all the medicines, herbs, non-prescription drugs, or dietary supplements you use. Also tell them if you smoke, drink alcohol, or use illegal drugs. Some items may interact with your medicine. What should I watch for while using this medicine? Visit your doctor or health care professional regularly. You may need blood work done while you are taking this medicine. You may need to follow a special diet. Talk to your doctor. Limit your alcohol intake and avoid smoking to get the best benefit. What side effects may I notice from receiving this medicine? Side effects that you should report to your doctor or health care professional as soon as possible: -allergic reactions like skin rash, itching or hives, swelling of the face, lips, or tongue -blue tint to skin -chest tightness, pain -difficulty breathing, wheezing -dizziness -red, swollen painful area on the leg Side effects that usually do not require medical attention (report to your doctor or health care professional if they continue or are bothersome): -diarrhea -headache This list may not describe all possible side effects. Call your doctor for medical advice about side effects. You may report side effects to FDA at 1-800-FDA-1088. Where should I keep my medicine? Keep out of the reach of children. Store at room temperature between 15 and 30 degrees C (59 and 85 degrees F). Protect from light. Throw away any unused medicine after the expiration date. NOTE: This sheet is a summary. It may not cover all possible information. If you have questions about this medicine, talk to your doctor, pharmacist, or   health care provider.  2018 Elsevier/Gold Standard (2007-09-28 22:10:20)  

## 2018-01-04 ENCOUNTER — Other Ambulatory Visit: Payer: Self-pay | Admitting: Cardiology

## 2018-01-18 ENCOUNTER — Encounter: Payer: Self-pay | Admitting: Cardiology

## 2018-01-18 NOTE — Progress Notes (Signed)
Cardiology Office Note   Date:  01/20/2018   ID:  Richard [name], DOB 10-13-1940, MRN [id number]  PCP:  Carollee Herter, Alferd Apa, DO  Cardiologist:   Minus Breeding, MD   No chief complaint on file.     History of Present Illness: Richard [name] is a 77 y.o. male who presents for follow up of CAD and TAVR.   He has had atrial flutter and heart block as well necessitating a pacemaker.   His biggest complaint is that he is been having lots of bruising.  If he cuts himself just her usual activities he has rapid bleeding.  He is breathing so much better than he was prior to his valve surgery.  He is golfing 3 times per week.  He denies any cardiovascular symptoms such as chest pressure, neck or arm discomfort.  He has no shortness of breath, PND or orthopnea.  He has had no weight gain or edema.     Past Medical History:  Diagnosis Date  . Anemia   . Axillary adenopathy 02/25/2017  . Bradycardia    a. holter monitor has demonstrated HRs in 30s and Weinkibach   . CAD (coronary artery disease)    a. s/p CABG 2001  b.  07/28/2017 cath:   Severe three-vessel native CAD with total occlusion of LAD, ramus intermedius, first OM and RCA, patent RIMA to PDA, LIMA to LAD, sequential SVG to ramus intermedius and first OM.    [name] Chronic lower back pain   . Diffuse large B cell lymphoma ([place])   . Diverticulosis   . Esophageal stricture   . GERD (gastroesophageal reflux disease)   . History of gout   . HTN (hypertension)   . Mixed hyperlipidemia   . OSA on CPAP    with 2L O2 at night  . Pancytopenia ([place])    a. related to chemo therapy for B cell lymphoma  . Peptic stricture of esophagus   . Severe aortic stenosis   . Spinal stenosis   . Type II diabetes mellitus ([place])     Past Surgical History:  Procedure Laterality Date  . APPENDECTOMY  ~ 1952  . BACK SURGERY    . CATARACT EXTRACTION W/PHACO Left 11/06/2016   Procedure: CATARACT EXTRACTION PHACO AND INTRAOCULAR LENS PLACEMENT (IOC);   Surgeon: Estill Cotta, MD;  Location: [facility];  Service: Ophthalmology;  Laterality: Left;  Lot # [id number] H [place]: 01:09.4 AP%:25.2 CDE: 30.64  . CATARACT EXTRACTION W/PHACO Right 12/04/2016   Procedure: CATARACT EXTRACTION PHACO AND INTRAOCULAR LENS PLACEMENT (IOC);  Surgeon: Estill Cotta, MD;  Location: [facility];  Service: Ophthalmology;  Laterality: Right;  [place] 1:25.9 AP% 24.1 CDE 39.10 Fluid Pack lot # [id number]  . COLONOSCOPY W/ BIOPSIES AND POLYPECTOMY  2013  . CORONARY ANGIOPLASTY  1993  . CORONARY ANGIOPLASTY WITH STENT PLACEMENT  05/1997   "1"  . CORONARY ARTERY BYPASS GRAFT  03/2000   "CABG X5"  . ESOPHAGEAL DILATION  X 3-4   Dr. Lyla Son; "last one was in the 1990's"  . ESOPHAGOGASTRODUODENOSCOPY     multiple  . FLEXIBLE SIGMOIDOSCOPY     multiple  . HYDRADENITIS EXCISION Left 02/25/2017   Procedure: EXCISION DEEP LEFT AXILLARY LYMPH NODE;  Surgeon: Fanny Skates, MD;  Location: [place];  Service: General;  Laterality: Left;  . INTRAOPERATIVE TRANSTHORACIC ECHOCARDIOGRAM N/A 09/09/2017   Procedure: INTRAOPERATIVE TRANSTHORACIC ECHOCARDIOGRAM;  Surgeon: Burnell Blanks, MD;  Location: [place];  Service: Open Heart Surgery;  Laterality: N/A;  . KNEE ARTHROSCOPY Left 2011   meniscus repair  . LEFT HEART CATHETERIZATION WITH CORONARY/GRAFT ANGIOGRAM N/A 03/16/2014   Procedure: LEFT HEART CATHETERIZATION WITH Beatrix Fetters;  Surgeon: Burnell Blanks, MD;  Location: [facility] CATH LAB;  Service: Cardiovascular;  Laterality: N/A;  . LUMBAR LAMINECTOMY/DECOMPRESSION MICRODISCECTOMY Right 06/17/2013   Procedure: LUMBAR LAMINECTOMY MICRODISCECTOMY L4-L5 RIGHT EXCISION OF SYNOVIAL CYST RIGHT   (1 LEVEL) RIGHT PARTIAL FACETECTOMY;  Surgeon: Tobi Bastos, MD;  Location[facility];  Service: Orthopedics;  Laterality: Right;  . MYELOGRAM  04/06/13   lumbar, Dr Gladstone Lighter  . PACEMAKER IMPLANT N/A 09/10/2017   Procedure: PACEMAKER IMPLANT;  Surgeon: Deboraha Sprang, MD;   Location: [place] CV LAB;  Service: Cardiovascular;  Laterality: N/A;  . PANENDOSCOPY    . PORTACATH PLACEMENT N/A 03/06/2017   Procedure: INSERTION PORT-A-CATH AND ASPIRATE SEROMA LEFT AXILLA;  Surgeon: Fanny Skates, MD;  Location: [place];  Service: General;  Laterality: N/A;  . RIGHT/LEFT HEART CATH AND CORONARY/GRAFT ANGIOGRAPHY N/A 07/28/2017   Procedure: RIGHT/LEFT HEART CATH AND CORONARY/GRAFT ANGIOGRAPHY;  Surgeon: Sherren Mocha, MD;  Location: [place] [facility];  Service: Cardiovascular;  Laterality: N/A;  . SHOULDER SURGERY Right 08/2010   screws placed; "tendons tore off"  . SKIN CANCER EXCISION Right    "neck"  . TONSILLECTOMY  ~ 1954  . TRANSCATHETER AORTIC VALVE REPLACEMENT, TRANSFEMORAL N/A 09/09/2017   Procedure: TRANSCATHETER AORTIC VALVE REPLACEMENT, TRANSFEMORAL;  Surgeon: Burnell Blanks, MD;  Location: [place];  Service: Open Heart Surgery;  Laterality: N/A;  . UPPER GI ENDOSCOPY  2013   Gastritis; Dr Carlean Purl  . VASECTOMY       Current Outpatient Medications  Medication Sig Dispense Refill  . allopurinol (ZYLOPRIM) 300 MG tablet Take 450 mg by mouth daily.     [name] amLODipine (NORVASC) 5 MG tablet Take 1 tablet BID 180 tablet 3  . apixaban (ELIQUIS) 5 MG TABS tablet Take 1 tablet (5 mg total) by mouth 2 (two) times daily. 60 tablet 1  . aspirin 81 MG tablet Take 81 mg by mouth daily.    [name] atorvastatin (LIPITOR) 80 MG tablet TAKE ONE TABLET BY MOUTH AT BEDTIME 90 tablet 3  . esomeprazole (NEXIUM) 40 MG capsule Take 1 capsule (40 mg total) by mouth daily. 30 capsule 5  . ezetimibe (ZETIA) 10 MG tablet TAKE 1 TABLET BY MOUTH EVERY DAY 90 tablet 3  . famciclovir (FAMVIR) 500 MG tablet Take 1 tablet (500 mg total) by mouth daily. 30 tablet 12  . fluticasone (FLONASE) 50 MCG/ACT nasal spray INHALE ONE SPRAY INTO EACH NOSTRIL TWICE DAILY. 16 g 6  . furosemide (LASIX) 40 MG tablet TAKE 3 TABLETS (120 MG TOTAL) 2 (TWO) TIMES DAILY BY MOUTH. 180 tablet 5  . glimepiride  (AMARYL) 2 MG tablet TAKE 1 TABLET (2 MG TOTAL) BY MOUTH DAILY WITH BREAKFAST. 90 tablet 0  . glucose blood (FREESTYLE LITE) test strip TEST BLOOD SUGAR bid 100 each 11  . Lancets (FREESTYLE) lancets Use BID to check blood sugar.  DX E11.9 100 each 6  . lipase/protease/amylase (CREON) 36000 UNITS CPEP capsule Take 2 capsules (72,000 Units total) by mouth 3 (three) times daily with meals. 1 capsule with snack. 210 capsule 2  . LYRICA 75 MG capsule TAKE 1 CAPSULE BY MOUTH THREE TIMES A DAY 270 capsule 1  . metFORMIN (GLUCOPHAGE) 1000 MG tablet TAKE ONE AND ONE-HALF TABLETS BY MOUTH EVERY MORNING AND 1 TALBET IN THE EVENING. [place]  tablet 1  . METFORMIN HCL PO Take 40 mg by mouth 2 (two) times daily with a meal. Pt takes 1.5 tablets in the AM and 1 tablet in the PM    . nitroGLYCERIN (NITROSTAT) 0.4 MG SL tablet Place 1 tablet (0.4 mg total) under the tongue every 5 (five) minutes as needed for chest pain. 25 tablet 3  . polycarbophil (FIBERCON) 625 MG tablet Take 625 mg by mouth daily.    . potassium chloride SA (K-DUR,KLOR-CON) 20 MEQ tablet Take 40 mEq by mouth 2 (two) times daily.    . predniSONE (DELTASONE) 10 MG tablet Taper prednisone for 12 days    . psyllium (METAMUCIL) 58.6 % powder Take 1 packet by mouth 3 (three) times daily.     No current facility-administered medications for this visit.    Facility-Administered Medications Ordered in Other Visits  Medication Dose Route Frequency Provider Last Rate Last Dose  . sodium chloride flush (NS) 0.9 % injection 10 mL  10 mL Intravenous PRN [name], MD   10 mL at 05/30/17 0920    Allergies:   Benazepril; Hctz [hydrochlorothiazide]; Aspirin; and Lactose intolerance (gi)    ROS:  Please see the history of present illness.   Otherwise, review of systems are positive for none.   All other systems are reviewed and negative.    PHYSICAL EXAM: VS:  BP 124/66   Pulse 83   Ht 6' (1.829 m)   Wt 240 lb 9.6 oz (109.1 kg)   BMI 32.63 kg/m   , BMI Body mass index is 32.63 kg/m. GENERAL:  Well appearing NECK:  No jugular venous distention, waveform within normal limits, carotid upstroke brisk and symmetric, no bruits, no thyromegaly LUNGS:  Clear to auscultation bilaterally CHEST:   Well healed sternotomy scar. HEART:  PMI not displaced or sustained,S1 and S2 within normal limits, no S3, no S4, no clicks, no rubs, soft apical systolic murmur with radiation into the carotids, no diastolic murmurs ABD:  Flat, positive bowel sounds normal in frequency in pitch, no bruits, no rebound, no guarding, no midline pulsatile mass, no hepatomegaly, no splenomegaly EXT:  2 plus pulses throughout, no edema, no cyanosis no clubbing   EKG:  EKG is not ordered today. The ekg ordered today demonstrates None   Recent Labs: 08/18/2017: TSH 2.101 09/05/2017: B Natriuretic Peptide 1,104.8 09/10/2017: Magnesium 1.8 12/01/2017: ALT 31; BUN 19; Creatinine, Ser 1.12; Potassium 5.1; Sodium 142 12/30/2017: Hemoglobin 12.5; Platelets 80.0    Lipid Panel    Component Value Date/Time   CHOL 111 08/05/2017 1150   TRIG 218.0 (H) 08/05/2017 1150   HDL 27.30 (L) 08/05/2017 1150   CHOLHDL 4 08/05/2017 1150   VLDL 43.6 (H) 08/05/2017 1150   LDLCALC 57 11/21/2016 1040   LDLDIRECT 51.0 08/05/2017 1150     Lab Results  Component Value Date   HGBA1C 5.4 09/05/2017     Wt Readings from Last 3 Encounters:  01/20/18 240 lb 9.6 oz (109.1 kg)  12/30/17 242 lb 6 oz (109.9 kg)  12/09/17 236 lb (107 kg)      Other studies Reviewed: Additional studies/ records that were reviewed today include:   Labs, echo results.  . Review of the above records demonstrates:  Please see elsewhere in the note.     ASSESSMENT AND PLAN:   S/p TAVR:   Normal function on recent echo.  See med change below.    CAD s/p CABG:   He has no current symptoms.  No change in therapy.   2-1 AV block s/p PPM: Underwent Medtronic MRI compatible dual-chamber pacemaker by Dr. Caryl Comes on  09/10/2017.     Atrial flutter: On Eliquis 5 mg twice daily.  I will stop his ASA secondary to his significant bruising and easy skin bleeding.  Hypertension: Blood pressure is at target.  No change in therapy.   Hyperlipidemia:   LDL is at target as above.   Continue current therapy.    DM 2:  Well controlled and followed by [name], DO.    Ascending aortic aneurysm:  45 mm.  I will follow this clinically and with repeat CT in the future.  I reviewed this with him today.      Current medicines are reviewed at length with the patient today.  The patient does not have concerns regarding medicines.  The following changes have been made:  As above.   Labs/ tests ordered today include: None No orders of the defined types were placed in this encounter.    Disposition:   FU with me in one year.      Signed, Minus Breeding, MD  01/20/2018 8:55 AM    [place]

## 2018-01-19 ENCOUNTER — Ambulatory Visit: Payer: Medicare Other | Admitting: Cardiology

## 2018-01-19 DIAGNOSIS — R430 Anosmia: Secondary | ICD-10-CM | POA: Diagnosis not present

## 2018-01-19 DIAGNOSIS — R438 Other disturbances of smell and taste: Secondary | ICD-10-CM | POA: Diagnosis not present

## 2018-01-20 ENCOUNTER — Encounter: Payer: Self-pay | Admitting: Cardiology

## 2018-01-20 ENCOUNTER — Ambulatory Visit (INDEPENDENT_AMBULATORY_CARE_PROVIDER_SITE_OTHER): Payer: Medicare Other | Admitting: Cardiology

## 2018-01-20 VITALS — BP 124/66 | HR 83 | Ht 72.0 in | Wt 240.6 lb

## 2018-01-20 DIAGNOSIS — T148XXA Other injury of unspecified body region, initial encounter: Secondary | ICD-10-CM | POA: Diagnosis not present

## 2018-01-20 DIAGNOSIS — I248 Other forms of acute ischemic heart disease: Secondary | ICD-10-CM

## 2018-01-20 DIAGNOSIS — I4892 Unspecified atrial flutter: Secondary | ICD-10-CM | POA: Diagnosis not present

## 2018-01-20 DIAGNOSIS — I10 Essential (primary) hypertension: Secondary | ICD-10-CM | POA: Diagnosis not present

## 2018-01-20 DIAGNOSIS — Z953 Presence of xenogenic heart valve: Secondary | ICD-10-CM

## 2018-01-20 DIAGNOSIS — E785 Hyperlipidemia, unspecified: Secondary | ICD-10-CM

## 2018-01-20 DIAGNOSIS — I2510 Atherosclerotic heart disease of native coronary artery without angina pectoris: Secondary | ICD-10-CM | POA: Diagnosis not present

## 2018-01-20 NOTE — Patient Instructions (Signed)
Medication Instructions:  STOP- Aspirin  If you need a refill on your cardiac medications before your next appointment, please call your pharmacy.  Labwork: None Ordered    Testing/Procedures: None Ordered  Follow-Up: Your physician wants you to follow-up in: 1 Year. You should receive a reminder letter in the mail two months in advance. If you do not receive a letter, please call our office [phone].      Thank you for choosing [facility] at [facility]!!

## 2018-01-29 ENCOUNTER — Inpatient Hospital Stay: Payer: Medicare Other | Attending: Hematology & Oncology | Admitting: Hematology & Oncology

## 2018-01-29 ENCOUNTER — Inpatient Hospital Stay: Payer: Medicare Other

## 2018-01-29 ENCOUNTER — Other Ambulatory Visit: Payer: Self-pay

## 2018-01-29 VITALS — BP 129/69 | HR 64 | Temp 97.9°F | Resp 20 | Wt 242.8 lb

## 2018-01-29 DIAGNOSIS — D500 Iron deficiency anemia secondary to blood loss (chronic): Secondary | ICD-10-CM

## 2018-01-29 DIAGNOSIS — I248 Other forms of acute ischemic heart disease: Secondary | ICD-10-CM | POA: Diagnosis not present

## 2018-01-29 DIAGNOSIS — M8588 Other specified disorders of bone density and structure, other site: Secondary | ICD-10-CM | POA: Insufficient documentation

## 2018-01-29 DIAGNOSIS — C8332 Diffuse large B-cell lymphoma, intrathoracic lymph nodes: Secondary | ICD-10-CM

## 2018-01-29 DIAGNOSIS — D510 Vitamin B12 deficiency anemia due to intrinsic factor deficiency: Secondary | ICD-10-CM | POA: Diagnosis not present

## 2018-01-29 DIAGNOSIS — D519 Vitamin B12 deficiency anemia, unspecified: Secondary | ICD-10-CM

## 2018-01-29 DIAGNOSIS — D508 Other iron deficiency anemias: Secondary | ICD-10-CM

## 2018-01-29 DIAGNOSIS — Z95828 Presence of other vascular implants and grafts: Secondary | ICD-10-CM

## 2018-01-29 LAB — IRON AND TIBC
Iron: 67 ug/dL (ref 42–163)
Saturation Ratios: 22 % — ABNORMAL LOW (ref 42–163)
TIBC: 301 ug/dL (ref 202–409)
UIBC: 235 ug/dL

## 2018-01-29 LAB — CBC WITH DIFFERENTIAL (CANCER CENTER ONLY)
BASOS ABS: 0.1 10*3/uL (ref 0.0–0.1)
BASOS PCT: 2 %
Band Neutrophils: 0 %
Blasts: 0 %
EOS PCT: 8 %
Eosinophils Absolute: 0.5 10*3/uL (ref 0.0–0.5)
HCT: 36 % — ABNORMAL LOW (ref 38.7–49.9)
Hemoglobin: 11.9 g/dL — ABNORMAL LOW (ref 13.0–17.1)
LYMPHS ABS: 0.3 10*3/uL — AB (ref 0.9–3.3)
Lymphocytes Relative: 4 %
MCH: 31.6 pg (ref 28.0–33.4)
MCHC: 33.1 g/dL (ref 32.0–35.9)
MCV: 95.5 fL (ref 82.0–98.0)
METAMYELOCYTES PCT: 0 %
MONO ABS: 0.8 10*3/uL (ref 0.1–0.9)
MYELOCYTES: 0 %
Monocytes Relative: 12 %
Neutro Abs: 4.9 10*3/uL (ref 1.5–6.5)
Neutrophils Relative %: 74 %
Other: 0 %
PROMYELOCYTES RELATIVE: 0 %
Platelet Count: 84 10*3/uL — ABNORMAL LOW (ref 145–400)
RBC: 3.77 MIL/uL — ABNORMAL LOW (ref 4.20–5.70)
RDW: 18.5 % — ABNORMAL HIGH (ref 11.1–15.7)
WBC Count: 6.6 10*3/uL (ref 4.0–10.0)
nRBC: 0 /100 WBC

## 2018-01-29 LAB — CMP (CANCER CENTER ONLY)
ALBUMIN: 3.7 g/dL (ref 3.5–5.0)
ALK PHOS: 94 U/L — AB (ref 26–84)
ALT: 41 U/L (ref 10–47)
AST: 28 U/L (ref 11–38)
Anion gap: 7 (ref 5–15)
BUN: 25 mg/dL — AB (ref 7–22)
CHLORIDE: 100 mmol/L (ref 98–108)
CO2: 30 mmol/L (ref 18–33)
CREATININE: 1.1 mg/dL (ref 0.60–1.20)
Calcium: 9.6 mg/dL (ref 8.0–10.3)
GLUCOSE: 194 mg/dL — AB (ref 73–118)
POTASSIUM: 3.3 mmol/L (ref 3.3–4.7)
SODIUM: 137 mmol/L (ref 128–145)
Total Bilirubin: 0.9 mg/dL (ref 0.2–1.6)
Total Protein: 6.5 g/dL (ref 6.4–8.1)

## 2018-01-29 LAB — LACTATE DEHYDROGENASE: LDH: 316 U/L — AB (ref 98–192)

## 2018-01-29 LAB — FERRITIN: Ferritin: 1199 ng/mL — ABNORMAL HIGH (ref 24–336)

## 2018-01-29 MED ORDER — HEPARIN SOD (PORK) LOCK FLUSH 100 UNIT/ML IV SOLN
500.0000 [IU] | Freq: Once | INTRAVENOUS | Status: AC
  Administered 2018-01-29: 500 [IU] via INTRAVENOUS
  Filled 2018-01-29: qty 5

## 2018-01-29 MED ORDER — CYANOCOBALAMIN 1000 MCG/ML IJ SOLN
1000.0000 ug | Freq: Once | INTRAMUSCULAR | Status: AC
Start: 2018-01-29 — End: 2018-01-29
  Administered 2018-01-29: 1000 ug via INTRAMUSCULAR

## 2018-01-29 MED ORDER — DENOSUMAB 120 MG/1.7ML ~~LOC~~ SOLN
120.0000 mg | Freq: Once | SUBCUTANEOUS | Status: AC
  Administered 2018-01-29: 120 mg via SUBCUTANEOUS

## 2018-01-29 MED ORDER — CYANOCOBALAMIN 1000 MCG/ML IJ SOLN
INTRAMUSCULAR | Status: AC
  Filled 2018-01-29: qty 1

## 2018-01-29 MED ORDER — DENOSUMAB 120 MG/1.7ML ~~LOC~~ SOLN
SUBCUTANEOUS | Status: AC
  Filled 2018-01-29: qty 1.7

## 2018-01-29 MED ORDER — FOLIC ACID 1 MG PO TABS: 2.0000 mg | ORAL_TABLET | Freq: Every day | ORAL | 12 refills | Status: DC

## 2018-01-29 MED ORDER — SODIUM CHLORIDE 0.9% FLUSH
10.0000 mL | INTRAVENOUS | Status: DC | PRN
  Administered 2018-01-29: 10 mL via INTRAVENOUS
  Filled 2018-01-29: qty 10

## 2018-01-29 NOTE — Progress Notes (Signed)
Hematology and Oncology Follow Up Visit  Richard [name] [id number] 02/22/1941 77 y.o. 01/29/2018   Principle Diagnosis:  Diffuse large cell non-Hodgkin's lymphoma (IPI = 3) - NOT "double hit" Pernicious anemia Iron deficiency secondary to bleeding  Current Therapy:   R-CHOP - s/p cycle#8 Vitamin B12 1 mg IM every month Xgeva 120 mg subcu q 3 months - next dose in 05/2018 IV iron with Feraheme.  Last dose given 10/2017   Interim History:  Richard [name] is here today for follow-up.  He looks fantastic.  He feels so well.  I think a lot of this has to do with his aortic valve being repaired.   He is playing golf.  He plays 3 times a week.  He has had no problems with bleeding.  He does have a lot of bruising from the Eliquis.  He has had no issues with nausea or vomiting.    He is had no bony pain.  There is been no change in bowel or bladder habits.  His last iron studies back in May showed a ferritin of 637 with iron saturation of 18%.  He did get a dose of IV iron.  Overall, his performance status is ECOG 1.   Medications:  Allergies as of 01/29/2018      Reactions   Benazepril Swelling   angioedema; he is not a candidate for any angiotensin receptor blockers because of this significant allergic reaction. Because of a history of documented adverse serious drug reaction;Medi Alert bracelet  is recommended   Hctz [hydrochlorothiazide] Anaphylaxis, Swelling   Tongue and lip swelling   Aspirin Other (See Comments)   Gastritis, cant take 325 Mg aspirin    Lactose Intolerance (gi) Nausea And Vomiting      Medication List        Accurate as of 01/29/18 10:05 AM. Always use your most recent med list.          allopurinol 300 MG tablet Commonly known as:  ZYLOPRIM Take 450 mg by mouth daily.   amLODipine 5 MG tablet Commonly known as:  NORVASC Take 1 tablet BID   apixaban 5 MG Tabs tablet Commonly known as:  ELIQUIS Take 1 tablet (5 mg total) by mouth 2 (two) times  daily.   atorvastatin 80 MG tablet Commonly known as:  LIPITOR TAKE ONE TABLET BY MOUTH AT BEDTIME   esomeprazole 40 MG capsule Commonly known as:  NEXIUM Take 1 capsule (40 mg total) by mouth daily.   ezetimibe 10 MG tablet Commonly known as:  ZETIA TAKE 1 TABLET BY MOUTH EVERY DAY   folic acid 1 MG tablet Commonly known as:  FOLVITE Take 2 tablets (2 mg total) by mouth daily.   freestyle lancets Use BID to check blood sugar.  DX E11.9   furosemide 40 MG tablet Commonly known as:  LASIX TAKE 3 TABLETS (120 MG TOTAL) 2 (TWO) TIMES DAILY BY MOUTH.   glimepiride 2 MG tablet Commonly known as:  AMARYL TAKE 1 TABLET (2 MG TOTAL) BY MOUTH DAILY WITH BREAKFAST.   glucose blood test strip Commonly known as:  FREESTYLE LITE TEST BLOOD SUGAR bid   LYRICA 75 MG capsule Generic drug:  pregabalin TAKE 1 CAPSULE BY MOUTH THREE TIMES A DAY   METFORMIN HCL PO Take 40 mg by mouth 2 (two) times daily with a meal. Pt takes 1.5 tablets in the AM and 1 tablet in the PM   metFORMIN 1000 MG tablet Commonly known as:  GLUCOPHAGE TAKE  ONE AND ONE-HALF TABLETS BY MOUTH EVERY MORNING AND 1 TALBET IN THE EVENING.   nitroGLYCERIN 0.4 MG SL tablet Commonly known as:  NITROSTAT Place 1 tablet (0.4 mg total) under the tongue every 5 (five) minutes as needed for chest pain.   polycarbophil 625 MG tablet Commonly known as:  FIBERCON Take 625 mg by mouth daily.   potassium chloride SA 20 MEQ tablet Commonly known as:  K-DUR,KLOR-CON Take 40 mEq by mouth 2 (two) times daily.   predniSONE 10 MG tablet Commonly known as:  DELTASONE Taper prednisone for 12 days   psyllium 58.6 % powder Commonly known as:  METAMUCIL Take 1 packet by mouth 3 (three) times daily.       Allergies:  Allergies  Allergen Reactions  . Benazepril Swelling    angioedema; he is not a candidate for any angiotensin receptor blockers because of this significant allergic reaction. Because of a history of  documented adverse serious drug reaction;Medi Alert bracelet  is recommended  . Hctz [Hydrochlorothiazide] Anaphylaxis and Swelling    Tongue and lip swelling   . Aspirin Other (See Comments)    Gastritis, cant take 325 Mg aspirin   . Lactose Intolerance (Gi) Nausea And Vomiting    Past Medical History, Surgical history, Social history, and Family History were reviewed and updated.  Review of Systems: Review of Systems  Constitutional: Negative.   HENT: Positive for congestion.   Eyes: Negative.   Respiratory: Negative.   Cardiovascular: Negative.   Gastrointestinal: Negative.   Genitourinary: Negative.   Musculoskeletal: Negative.   Skin: Negative.   Neurological: Negative.   Endo/Heme/Allergies: Negative.      Physical Exam:  weight is 242 lb 12 oz (110.1 kg). His oral temperature is 97.9 F (36.6 C). His blood pressure is 129/69 and his pulse is 64. His respiration is 20 and oxygen saturation is 97%.   Wt Readings from Last 3 Encounters:  01/29/18 242 lb 12 oz (110.1 kg)  01/20/18 240 lb 9.6 oz (109.1 kg)  12/30/17 242 lb 6 oz (109.9 kg)    Physical Exam  Constitutional: He is oriented to person, place, and time.  HENT:  Head: Normocephalic and atraumatic.  Mouth/Throat: Oropharynx is clear and moist.  Eyes: Pupils are equal, round, and reactive to light. EOM are normal.  Neck: Normal range of motion.  Cardiovascular: Normal rate, regular rhythm and normal heart sounds.  Pulmonary/Chest: Effort normal and breath sounds normal.  Abdominal: Soft. Bowel sounds are normal.  Musculoskeletal: Normal range of motion. He exhibits no edema, tenderness or deformity.  Lymphadenopathy:    He has no cervical adenopathy.  Neurological: He is alert and oriented to person, place, and time.  Skin: Skin is warm and dry. No rash noted. No erythema.  Psychiatric: He has a normal mood and affect. His behavior is normal. Judgment and thought content normal.  Vitals  reviewed.   Lab Results  Component Value Date   WBC 6.6 01/29/2018   HGB 11.9 (L) 01/29/2018   HCT 36.0 (L) 01/29/2018   MCV 95.5 01/29/2018   PLT 84 (L) 01/29/2018   Lab Results  Component Value Date   FERRITIN 637 (H) 11/07/2017   IRON 52 11/07/2017   TIBC 296 11/07/2017   UIBC 244 11/07/2017   IRONPCTSAT 18 (L) 11/07/2017   Lab Results  Component Value Date   RETICCTPCT 1.1 11/22/2011   RBC 3.77 (L) 01/29/2018   RETICCTABS 52.1 11/22/2011   No results found for: KPAFRELGTCHN, LAMBDASER, KAPLAMBRATIO  Lab Results  Component Value Date   IGA 159 03/09/2012   No results found for: Odetta Pink, SPEI   Chemistry      Component Value Date/Time   NA 137 01/29/2018 0843   NA 137 08/01/2017 0805   NA 144 06/27/2017 0857   K 3.3 01/29/2018 0843   K 3.9 06/27/2017 0857   CL 100 01/29/2018 0843   CL 103 06/27/2017 0857   CO2 30 01/29/2018 0843   CO2 26 06/27/2017 0857   BUN 25 (H) 01/29/2018 0843   BUN 12 08/01/2017 0805   BUN 12 06/27/2017 0857   CREATININE 1.10 01/29/2018 0843   CREATININE 1.0 06/27/2017 0857      Component Value Date/Time   CALCIUM 9.6 01/29/2018 0843   CALCIUM 9.2 06/27/2017 0857   ALKPHOS 94 (H) 01/29/2018 0843   ALKPHOS 128 (H) 06/27/2017 0857   AST 28 01/29/2018 0843   ALT 41 01/29/2018 0843   ALT 24 06/27/2017 0857   BILITOT 0.9 01/29/2018 0843      Impression and Plan: Mr. Crowson is a very pleasant 77 yo caucasian gentleman with diffuse large B-cell lymphoma (not double hit lymphoma).   Everything looks quite good.  Do think that folic acid will help him.  I suspect that he may have a little bit of hemolysis from his aortic valve.  I think folic acid at 2 mg a day would be very beneficial.  He will get his Delton See today.   He will continue to get his B12 shot monthly.  We will see what his iron studies show.  If necessary, we will give him IV iron.   I will plan to see him  back in another 3 months.  [name], MD 8/1/201910:05 AM

## 2018-01-29 NOTE — Patient Instructions (Signed)
Cyanocobalamin, Vitamin B12 injection What is this medicine? CYANOCOBALAMIN (sye an oh koe BAL a min) is a man made form of vitamin B12. Vitamin B12 is used in the growth of healthy blood cells, nerve cells, and proteins in the body. It also helps with the metabolism of fats and carbohydrates. This medicine is used to treat people who can not absorb vitamin B12. This medicine may be used for other purposes; ask your health care provider or pharmacist if you have questions. COMMON BRAND NAME(S): B-12 Compliance Kit, B-12 Injection Kit, Cyomin, LA-12, Nutri-Twelve, Physicians EZ Use B-12, Primabalt What should I tell my health care provider before I take this medicine? They need to know if you have any of these conditions: -kidney disease -Leber's disease -megaloblastic anemia -an unusual or allergic reaction to cyanocobalamin, cobalt, other medicines, foods, dyes, or preservatives -pregnant or trying to get pregnant -breast-feeding How should I use this medicine? This medicine is injected into a muscle or deeply under the skin. It is usually given by a health care professional in a clinic or doctor's office. However, your doctor may teach you how to inject yourself. Follow all instructions. Talk to your pediatrician regarding the use of this medicine in children. Special care may be needed. Overdosage: If you think you have taken too much of this medicine contact a poison control center or emergency room at once. NOTE: This medicine is only for you. Do not share this medicine with others. What if I miss a dose? If you are given your dose at a clinic or doctor's office, call to reschedule your appointment. If you give your own injections and you miss a dose, take it as soon as you can. If it is almost time for your next dose, take only that dose. Do not take double or extra doses. What may interact with this medicine? -colchicine -heavy alcohol intake This list may not describe all possible  interactions. Give your health care provider a list of all the medicines, herbs, non-prescription drugs, or dietary supplements you use. Also tell them if you smoke, drink alcohol, or use illegal drugs. Some items may interact with your medicine. What should I watch for while using this medicine? Visit your doctor or health care professional regularly. You may need blood work done while you are taking this medicine. You may need to follow a special diet. Talk to your doctor. Limit your alcohol intake and avoid smoking to get the best benefit. What side effects may I notice from receiving this medicine? Side effects that you should report to your doctor or health care professional as soon as possible: -allergic reactions like skin rash, itching or hives, swelling of the face, lips, or tongue -blue tint to skin -chest tightness, pain -difficulty breathing, wheezing -dizziness -red, swollen painful area on the leg Side effects that usually do not require medical attention (report to your doctor or health care professional if they continue or are bothersome): -diarrhea -headache This list may not describe all possible side effects. Call your doctor for medical advice about side effects. You may report side effects to FDA at 1-800-FDA-1088. Where should I keep my medicine? Keep out of the reach of children. Store at room temperature between 15 and 30 degrees C (59 and 85 degrees F). Protect from light. Throw away any unused medicine after the expiration date. NOTE: This sheet is a summary. It may not cover all possible information. If you have questions about this medicine, talk to your doctor, pharmacist, or   health care provider.  2018 Elsevier/Gold Standard (2007-09-28 22:10:20) Denosumab injection What is this medicine? DENOSUMAB (den oh sue mab) slows bone breakdown. Prolia is used to treat osteoporosis in women after menopause and in men. Delton See is used to treat a high calcium level due to  cancer and to prevent bone fractures and other bone problems caused by multiple myeloma or cancer bone metastases. Delton See is also used to treat giant cell tumor of the bone. This medicine may be used for other purposes; ask your health care provider or pharmacist if you have questions. COMMON BRAND NAME(S): Prolia, XGEVA What should I tell my health care provider before I take this medicine? They need to know if you have any of these conditions: -dental disease -having surgery or tooth extraction -infection -kidney disease -low levels of calcium or Vitamin D in the blood -malnutrition -on hemodialysis -skin conditions or sensitivity -thyroid or parathyroid disease -an unusual reaction to denosumab, other medicines, foods, dyes, or preservatives -pregnant or trying to get pregnant -breast-feeding How should I use this medicine? This medicine is for injection under the skin. It is given by a health care professional in a hospital or clinic setting. If you are getting Prolia, a special MedGuide will be given to you by the pharmacist with each prescription and refill. Be sure to read this information carefully each time. For Prolia, talk to your pediatrician regarding the use of this medicine in children. Special care may be needed. For Delton See, talk to your pediatrician regarding the use of this medicine in children. While this drug may be prescribed for children as young as 13 years for selected conditions, precautions do apply. Overdosage: If you think you have taken too much of this medicine contact a poison control center or emergency room at once. NOTE: This medicine is only for you. Do not share this medicine with others. What if I miss a dose? It is important not to miss your dose. Call your doctor or health care professional if you are unable to keep an appointment. What may interact with this medicine? Do not take this medicine with any of the following medications: -other medicines  containing denosumab This medicine may also interact with the following medications: -medicines that lower your chance of fighting infection -steroid medicines like prednisone or cortisone This list may not describe all possible interactions. Give your health care provider a list of all the medicines, herbs, non-prescription drugs, or dietary supplements you use. Also tell them if you smoke, drink alcohol, or use illegal drugs. Some items may interact with your medicine. What should I watch for while using this medicine? Visit your doctor or health care professional for regular checks on your progress. Your doctor or health care professional may order blood tests and other tests to see how you are doing. Call your doctor or health care professional for advice if you get a fever, chills or sore throat, or other symptoms of a cold or flu. Do not treat yourself. This drug may decrease your body's ability to fight infection. Try to avoid being around people who are sick. You should make sure you get enough calcium and vitamin D while you are taking this medicine, unless your doctor tells you not to. Discuss the foods you eat and the vitamins you take with your health care professional. See your dentist regularly. Brush and floss your teeth as directed. Before you have any dental work done, tell your dentist you are receiving this medicine. Do not become pregnant  while taking this medicine or for 5 months after stopping it. Talk with your doctor or health care professional about your birth control options while taking this medicine. Women should inform their doctor if they wish to become pregnant or think they might be pregnant. There is a potential for serious side effects to an unborn child. Talk to your health care professional or pharmacist for more information. What side effects may I notice from receiving this medicine? Side effects that you should report to your doctor or health care professional as  soon as possible: -allergic reactions like skin rash, itching or hives, swelling of the face, lips, or tongue -bone pain -breathing problems -dizziness -jaw pain, especially after dental work -redness, blistering, peeling of the skin -signs and symptoms of infection like fever or chills; cough; sore throat; pain or trouble passing urine -signs of low calcium like fast heartbeat, muscle cramps or muscle pain; pain, tingling, numbness in the hands or feet; seizures -unusual bleeding or bruising -unusually weak or tired Side effects that usually do not require medical attention (report to your doctor or health care professional if they continue or are bothersome): -constipation -diarrhea -headache -joint pain -loss of appetite -muscle pain -runny nose -tiredness -upset stomach This list may not describe all possible side effects. Call your doctor for medical advice about side effects. You may report side effects to FDA at 1-800-FDA-1088. Where should I keep my medicine? This medicine is only given in a clinic, doctor's office, or other health care setting and will not be stored at home. NOTE: This sheet is a summary. It may not cover all possible information. If you have questions about this medicine, talk to your doctor, pharmacist, or health care provider.  2018 Elsevier/Gold Standard (2016-07-09 19:17:21)

## 2018-01-30 ENCOUNTER — Telehealth: Payer: Self-pay | Admitting: *Deleted

## 2018-01-30 NOTE — Telephone Encounter (Addendum)
Patient is aware of results. Appointment made  ----- Message from [name], MD sent at 01/29/2018  4:11 PM EDT ----- Call - iron is low.  Needs 1 dose of IV iron. Laurey Arrow

## 2018-01-31 ENCOUNTER — Other Ambulatory Visit: Payer: Self-pay | Admitting: Cardiology

## 2018-02-05 ENCOUNTER — Inpatient Hospital Stay: Payer: Medicare Other

## 2018-02-05 VITALS — BP 144/70 | HR 79 | Temp 97.8°F | Resp 16

## 2018-02-05 DIAGNOSIS — D519 Vitamin B12 deficiency anemia, unspecified: Secondary | ICD-10-CM

## 2018-02-05 DIAGNOSIS — D508 Other iron deficiency anemias: Secondary | ICD-10-CM

## 2018-02-05 DIAGNOSIS — D500 Iron deficiency anemia secondary to blood loss (chronic): Secondary | ICD-10-CM | POA: Diagnosis not present

## 2018-02-05 DIAGNOSIS — C8332 Diffuse large B-cell lymphoma, intrathoracic lymph nodes: Secondary | ICD-10-CM | POA: Diagnosis not present

## 2018-02-05 DIAGNOSIS — I248 Other forms of acute ischemic heart disease: Secondary | ICD-10-CM | POA: Diagnosis not present

## 2018-02-05 DIAGNOSIS — D510 Vitamin B12 deficiency anemia due to intrinsic factor deficiency: Secondary | ICD-10-CM | POA: Diagnosis not present

## 2018-02-05 DIAGNOSIS — M8588 Other specified disorders of bone density and structure, other site: Secondary | ICD-10-CM | POA: Diagnosis not present

## 2018-02-05 MED ORDER — SODIUM CHLORIDE 0.9 % IV SOLN
INTRAVENOUS | Status: DC
  Administered 2018-02-05: 14:00:00 via INTRAVENOUS
  Filled 2018-02-05: qty 250

## 2018-02-05 MED ORDER — FERUMOXYTOL INJECTION 510 MG/17 ML
510.0000 mg | Freq: Once | INTRAVENOUS | Status: AC
  Administered 2018-02-05: 510 mg via INTRAVENOUS
  Filled 2018-02-05: qty 17

## 2018-02-05 NOTE — Patient Instructions (Signed)

## 2018-02-09 ENCOUNTER — Other Ambulatory Visit: Payer: Self-pay | Admitting: Family

## 2018-02-09 ENCOUNTER — Other Ambulatory Visit: Payer: Self-pay | Admitting: Cardiology

## 2018-02-09 ENCOUNTER — Telehealth: Payer: Self-pay | Admitting: *Deleted

## 2018-02-09 DIAGNOSIS — C8332 Diffuse large B-cell lymphoma, intrathoracic lymph nodes: Secondary | ICD-10-CM

## 2018-02-09 DIAGNOSIS — M436 Torticollis: Secondary | ICD-10-CM

## 2018-02-09 NOTE — Telephone Encounter (Signed)
Patient c/o stiff neck with onset starting Saturday. He is very concerned because this was his presenting symptoms last year when he was diagnosed with lymphoma.   Reviewed symptom with Dr Marin Olp. The best way to determine if there is any issue regarding the patient's lymphoma is to obtain a PET scan. Orders will be placed.  Patient notified of MD plan. He knows they will reach out to him about scheduling.

## 2018-02-20 ENCOUNTER — Other Ambulatory Visit: Payer: Self-pay | Admitting: Family Medicine

## 2018-02-24 ENCOUNTER — Ambulatory Visit (HOSPITAL_COMMUNITY)
Admission: RE | Admit: 2018-02-24 | Discharge: 2018-02-24 | Disposition: A | Discharge status: Home / Self Care | Payer: Medicare Other | Source: Ambulatory Visit | Attending: Family | Admitting: Family

## 2018-02-24 DIAGNOSIS — M436 Torticollis: Secondary | ICD-10-CM | POA: Insufficient documentation

## 2018-02-24 DIAGNOSIS — C8330 Diffuse large B-cell lymphoma, unspecified site: Secondary | ICD-10-CM | POA: Diagnosis not present

## 2018-02-24 DIAGNOSIS — R590 Localized enlarged lymph nodes: Secondary | ICD-10-CM | POA: Diagnosis not present

## 2018-02-24 DIAGNOSIS — C8332 Diffuse large B-cell lymphoma, intrathoracic lymph nodes: Secondary | ICD-10-CM

## 2018-02-24 DIAGNOSIS — C8232 Follicular lymphoma grade IIIa, intrathoracic lymph nodes: Secondary | ICD-10-CM | POA: Diagnosis not present

## 2018-02-24 LAB — GLUCOSE, CAPILLARY: Glucose-Capillary: 158 mg/dL — ABNORMAL HIGH (ref 70–99)

## 2018-02-24 MED ORDER — FLUDEOXYGLUCOSE F - 18 (FDG) INJECTION
12.0400 | Freq: Once | INTRAVENOUS | Status: AC | PRN
  Administered 2018-02-24: 12.04 via INTRAVENOUS

## 2018-02-25 ENCOUNTER — Other Ambulatory Visit: Payer: Self-pay | Admitting: Cardiology

## 2018-02-25 ENCOUNTER — Telehealth: Payer: Self-pay | Admitting: *Deleted

## 2018-02-25 NOTE — Telephone Encounter (Addendum)
Detailed message left on cell phone voice mail  ----- Message from [name], MD sent at 02/25/2018  5:50 AM EDT ----- Call -= PET scan is normal.  pete

## 2018-02-26 ENCOUNTER — Ambulatory Visit: Payer: Medicare Other

## 2018-03-05 ENCOUNTER — Ambulatory Visit: Payer: Medicare Other

## 2018-03-05 ENCOUNTER — Inpatient Hospital Stay: Payer: Medicare Other | Attending: Hematology & Oncology

## 2018-03-05 DIAGNOSIS — D519 Vitamin B12 deficiency anemia, unspecified: Secondary | ICD-10-CM

## 2018-03-05 DIAGNOSIS — D508 Other iron deficiency anemias: Secondary | ICD-10-CM

## 2018-03-05 DIAGNOSIS — D510 Vitamin B12 deficiency anemia due to intrinsic factor deficiency: Secondary | ICD-10-CM | POA: Insufficient documentation

## 2018-03-05 MED ORDER — CYANOCOBALAMIN 1000 MCG/ML IJ SOLN
INTRAMUSCULAR | Status: AC
  Filled 2018-03-05: qty 1

## 2018-03-05 MED ORDER — CYANOCOBALAMIN 1000 MCG/ML IJ SOLN
1000.0000 ug | Freq: Once | INTRAMUSCULAR | Status: AC
  Administered 2018-03-05: 1000 ug via INTRAMUSCULAR

## 2018-03-05 NOTE — Patient Instructions (Signed)
Cyanocobalamin, Vitamin B12 injection What is this medicine? CYANOCOBALAMIN (sye an oh koe BAL a min) is a man made form of vitamin B12. Vitamin B12 is used in the growth of healthy blood cells, nerve cells, and proteins in the body. It also helps with the metabolism of fats and carbohydrates. This medicine is used to treat people who can not absorb vitamin B12. This medicine may be used for other purposes; ask your health care provider or pharmacist if you have questions. COMMON BRAND NAME(S): B-12 Compliance Kit, B-12 Injection Kit, Cyomin, LA-12, Nutri-Twelve, Physicians EZ Use B-12, Primabalt What should I tell my health care provider before I take this medicine? They need to know if you have any of these conditions: -kidney disease -Leber's disease -megaloblastic anemia -an unusual or allergic reaction to cyanocobalamin, cobalt, other medicines, foods, dyes, or preservatives -pregnant or trying to get pregnant -breast-feeding How should I use this medicine? This medicine is injected into a muscle or deeply under the skin. It is usually given by a health care professional in a clinic or doctor's office. However, your doctor may teach you how to inject yourself. Follow all instructions. Talk to your pediatrician regarding the use of this medicine in children. Special care may be needed. Overdosage: If you think you have taken too much of this medicine contact a poison control center or emergency room at once. NOTE: This medicine is only for you. Do not share this medicine with others. What if I miss a dose? If you are given your dose at a clinic or doctor's office, call to reschedule your appointment. If you give your own injections and you miss a dose, take it as soon as you can. If it is almost time for your next dose, take only that dose. Do not take double or extra doses. What may interact with this medicine? -colchicine -heavy alcohol intake This list may not describe all possible  interactions. Give your health care provider a list of all the medicines, herbs, non-prescription drugs, or dietary supplements you use. Also tell them if you smoke, drink alcohol, or use illegal drugs. Some items may interact with your medicine. What should I watch for while using this medicine? Visit your doctor or health care professional regularly. You may need blood work done while you are taking this medicine. You may need to follow a special diet. Talk to your doctor. Limit your alcohol intake and avoid smoking to get the best benefit. What side effects may I notice from receiving this medicine? Side effects that you should report to your doctor or health care professional as soon as possible: -allergic reactions like skin rash, itching or hives, swelling of the face, lips, or tongue -blue tint to skin -chest tightness, pain -difficulty breathing, wheezing -dizziness -red, swollen painful area on the leg Side effects that usually do not require medical attention (report to your doctor or health care professional if they continue or are bothersome): -diarrhea -headache This list may not describe all possible side effects. Call your doctor for medical advice about side effects. You may report side effects to FDA at 1-800-FDA-1088. Where should I keep my medicine? Keep out of the reach of children. Store at room temperature between 15 and 30 degrees C (59 and 85 degrees F). Protect from light. Throw away any unused medicine after the expiration date. NOTE: This sheet is a summary. It may not cover all possible information. If you have questions about this medicine, talk to your doctor, pharmacist, or   health care provider.  2018 Elsevier/Gold Standard (2007-09-28 22:10:20)

## 2018-03-09 DIAGNOSIS — Z23 Encounter for immunization: Secondary | ICD-10-CM | POA: Diagnosis not present

## 2018-03-09 DIAGNOSIS — M25562 Pain in left knee: Secondary | ICD-10-CM | POA: Diagnosis not present

## 2018-03-09 DIAGNOSIS — M545 Low back pain: Secondary | ICD-10-CM | POA: Diagnosis not present

## 2018-03-17 ENCOUNTER — Telehealth: Payer: Self-pay

## 2018-03-17 NOTE — Telephone Encounter (Signed)
   [facility] Pre-operative Risk Assessment    Request for surgical clearance:  1. What type of surgery is being performed?  Lumbar ESI    2. When is this surgery scheduled? 03/24/2018   3. What type of clearance is required (medical clearance vs. Pharmacy clearance to hold med vs. Both)? Both  4. Are there any medications that need to be held prior to surgery and how long? Eliquis 3 days prior    5. Practice name and name of physician performing surgery? [facility], Not listed    6. What is your office phone number [phone]    7.   What is your office fax number [phone]  8.   Anesthesia type (None, local, MAC, general) ? Not specified   Richard [name] 03/17/2018, 4:47 PM  _________________________________________________________________   (provider comments below)

## 2018-03-18 NOTE — Telephone Encounter (Signed)
   Primary Cardiologist: Minus Breeding, MD  Chart reviewed as part of pre-operative protocol coverage. Patient was contacted 03/18/2018 in reference to pre-operative risk assessment for pending surgery as outlined below.  Richard [name] was last seen on 01/20/18 by Dr. Percival Spanish.  Since that day, Richard [name] has done well w/o anginal symptomatology. No exertional CP or dyspnea.   Therefore, based on ACC/AHA guidelines, the patient would be at acceptable risk for the planned procedure without further cardiovascular testing.   Per office protocol, patient can hold Eliquis for 3 days prior to procedure.    I will route this recommendation to the requesting party via Epic fax function and remove from pre-op pool.  Please call with questions.  Lyda Jester, PA-C 03/18/2018, 3:15 PM

## 2018-03-18 NOTE — Telephone Encounter (Signed)
Patient with diagnosis of Afib on Eliquis for anticoagulation.    Procedure: Lumbar ESI Date of procedure: 03/24/18  CHADS2-VASc score of  6 (CHF, HTN, AGE, DM2, stroke/tia x 2, CAD, AGE, male)  CrCl 70ml/min  Per office protocol, patient can hold Eliquis for 3 days prior to procedure.

## 2018-03-24 DIAGNOSIS — M5136 Other intervertebral disc degeneration, lumbar region: Secondary | ICD-10-CM | POA: Diagnosis not present

## 2018-03-25 ENCOUNTER — Telehealth: Payer: Self-pay | Admitting: Family Medicine

## 2018-03-25 NOTE — Telephone Encounter (Signed)
Copied from [place] [id number]. Topic: Quick Communication - See Telephone Encounter >> Mar 25, 2018  3:03 PM Adelene Idler wrote: Pt is calling wanting to know if samples of eliquis are available for pick up   Cb# [phone]

## 2018-03-26 ENCOUNTER — Ambulatory Visit: Payer: Medicare Other

## 2018-03-30 NOTE — Telephone Encounter (Signed)
Author phoned pt. To relay that we do have 5mg  available, as pt. Stated he cannot afford it. Routed to Dr. Etter Sjogren and Dr. Percival Spanish to suggest long-term solution. Appt made for 10/2 for acute febrile illness per pt. Request with Dr. Larose Kells. Sample with co-insurance card left at sample med area; [place], Dr. Ethel Rana CMA, made aware.

## 2018-03-31 NOTE — Telephone Encounter (Signed)
Patient came in to pickup samples.  Samples given to patient.

## 2018-04-01 ENCOUNTER — Encounter: Payer: Self-pay | Admitting: Internal Medicine

## 2018-04-01 ENCOUNTER — Ambulatory Visit (INDEPENDENT_AMBULATORY_CARE_PROVIDER_SITE_OTHER): Payer: Medicare Other | Admitting: Internal Medicine

## 2018-04-01 VITALS — BP 126/72 | HR 67 | Temp 97.7°F | Resp 16 | Ht 72.0 in | Wt 244.5 lb

## 2018-04-01 DIAGNOSIS — I248 Other forms of acute ischemic heart disease: Secondary | ICD-10-CM

## 2018-04-01 DIAGNOSIS — J069 Acute upper respiratory infection, unspecified: Secondary | ICD-10-CM

## 2018-04-01 MED ORDER — AMOXICILLIN 500 MG PO CAPS: 1000.0000 mg | ORAL_CAPSULE | Freq: Two times a day (BID) | ORAL | 0 refills | Status: DC

## 2018-04-01 MED ORDER — AZELASTINE HCL 0.1 % NA SOLN: 2.0000 | Freq: Every evening | NASAL | 3 refills | Status: DC | PRN

## 2018-04-01 NOTE — Progress Notes (Signed)
Pre visit review using our clinic review tool, if applicable. No additional management support is needed unless otherwise documented below in the visit note. 

## 2018-04-01 NOTE — Progress Notes (Signed)
Subjective:    Patient ID: Richard [name], male    DOB: 10-01-1940, 77 y.o.   MRN: [id number]  DOS:  04/01/2018 Type of visit - description : acute Interval history: Symptoms of started approximately 3 days ago with nose congestion, nasal discharge, yellow in color. Some malaise.  He felt some subjective fever and sweats.  Denies chills. In the mornings, he has a lot of mucus pooled in the throat, he feels is due to postnasal dripping.  Review of Systems  Denies nausea, vomiting.  Mild myalgias. Chest is not congested per se. No earache or sore throat.  Past Medical History:  Diagnosis Date  . Anemia   . Axillary adenopathy 02/25/2017  . Bradycardia    a. holter monitor has demonstrated HRs in 30s and [place]   . CAD (coronary artery disease)    a. s/p CABG 2001  b.  07/28/2017 cath:   Severe three-vessel native CAD with total occlusion of LAD, ramus intermedius, first OM and RCA, patent RIMA to PDA, LIMA to LAD, sequential SVG to ramus intermedius and first OM.    [name] Chronic lower back pain   . Diffuse large B cell lymphoma ([place])   . Diverticulosis   . Esophageal stricture   . GERD (gastroesophageal reflux disease)   . History of gout   . HTN (hypertension)   . Mixed hyperlipidemia   . OSA on CPAP    with 2L O2 at night  . Pancytopenia ([place])    a. related to chemo therapy for B cell lymphoma  . Peptic stricture of esophagus   . Severe aortic stenosis   . Spinal stenosis   . Type II diabetes mellitus ([place])     Past Surgical History:  Procedure Laterality Date  . APPENDECTOMY  ~ 1952  . BACK SURGERY    . CATARACT EXTRACTION W/PHACO Left 11/06/2016   Procedure: CATARACT EXTRACTION PHACO AND INTRAOCULAR LENS PLACEMENT (IOC);  Surgeon: Estill Cotta, MD;  Location: [facility];  Service: Ophthalmology;  Laterality: Left;  Lot # [id number] H [place]: 01:09.4 AP%:25.2 CDE: 30.64  . CATARACT EXTRACTION W/PHACO Right 12/04/2016   Procedure: CATARACT EXTRACTION PHACO AND  INTRAOCULAR LENS PLACEMENT (IOC);  Surgeon: Estill Cotta, MD;  Location: [facility];  Service: Ophthalmology;  Laterality: Right;  [place] 1:25.9 AP% 24.1 CDE 39.10 Fluid Pack lot # [id number]  . COLONOSCOPY W/ BIOPSIES AND POLYPECTOMY  2013  . CORONARY ANGIOPLASTY  1993  . CORONARY ANGIOPLASTY WITH STENT PLACEMENT  05/1997   "1"  . CORONARY ARTERY BYPASS GRAFT  03/2000   "CABG X5"  . ESOPHAGEAL DILATION  X 3-4   Dr. Lyla Son; "last one was in the 1990's"  . ESOPHAGOGASTRODUODENOSCOPY     multiple  . FLEXIBLE SIGMOIDOSCOPY     multiple  . HYDRADENITIS EXCISION Left 02/25/2017   Procedure: EXCISION DEEP LEFT AXILLARY LYMPH NODE;  Surgeon: Fanny Skates, MD;  Location: [place];  Service: General;  Laterality: Left;  . INTRAOPERATIVE TRANSTHORACIC ECHOCARDIOGRAM N/A 09/09/2017   Procedure: INTRAOPERATIVE TRANSTHORACIC ECHOCARDIOGRAM;  Surgeon: Burnell Blanks, MD;  Location: [place];  Service: Open Heart Surgery;  Laterality: N/A;  . KNEE ARTHROSCOPY Left 2011   meniscus repair  . LEFT HEART CATHETERIZATION WITH CORONARY/GRAFT ANGIOGRAM N/A 03/16/2014   Procedure: LEFT HEART CATHETERIZATION WITH Beatrix Fetters;  Surgeon: Burnell Blanks, MD;  Location: [facility] CATH LAB;  Service: Cardiovascular;  Laterality: N/A;  . LUMBAR LAMINECTOMY/DECOMPRESSION MICRODISCECTOMY Right 06/17/2013   Procedure: LUMBAR LAMINECTOMY MICRODISCECTOMY L4-L5 RIGHT EXCISION  OF SYNOVIAL CYST RIGHT   (1 LEVEL) RIGHT PARTIAL FACETECTOMY;  Surgeon: Tobi Bastos, MD;  Location: [facility];  Service: Orthopedics;  Laterality: Right;  . MYELOGRAM  04/06/13   lumbar, Dr Gladstone Lighter  . PACEMAKER IMPLANT N/A 09/10/2017   Procedure: PACEMAKER IMPLANT;  Surgeon: Deboraha Sprang, MD;  Location: [place] CV LAB;  Service: Cardiovascular;  Laterality: N/A;  . PANENDOSCOPY    . PORTACATH PLACEMENT N/A 03/06/2017   Procedure: INSERTION PORT-A-CATH AND ASPIRATE SEROMA LEFT AXILLA;  Surgeon: Fanny Skates, MD;  Location:  [place];  Service: General;  Laterality: N/A;  . RIGHT/LEFT HEART CATH AND CORONARY/GRAFT ANGIOGRAPHY N/A 07/28/2017   Procedure: RIGHT/LEFT HEART CATH AND CORONARY/GRAFT ANGIOGRAPHY;  Surgeon: Sherren Mocha, MD;  Location: [place] CV LAB;  Service: Cardiovascular;  Laterality: N/A;  . SHOULDER SURGERY Right 08/2010   screws placed; "tendons tore off"  . SKIN CANCER EXCISION Right    "neck"  . TONSILLECTOMY  ~ 1954  . TRANSCATHETER AORTIC VALVE REPLACEMENT, TRANSFEMORAL N/A 09/09/2017   Procedure: TRANSCATHETER AORTIC VALVE REPLACEMENT, TRANSFEMORAL;  Surgeon: Burnell Blanks, MD;  Location: [place];  Service: Open Heart Surgery;  Laterality: N/A;  . UPPER GI ENDOSCOPY  2013   Gastritis; Dr Carlean Purl  . VASECTOMY      Social History   Socioeconomic History  . Marital status: Divorced    Spouse name: Not on file  . Number of children: 2  . Years of education: Not on file  . Highest education level: Not on file  Occupational History    Employer: RETIRED  Social Needs  . Financial resource strain: Not on file  . Food insecurity:    Worry: Not on file    Inability: Not on file  . Transportation needs:    Medical: Not on file    Non-medical: Not on file  Tobacco Use  . Smoking status: Never Smoker  . Smokeless tobacco: Never Used  Substance and Sexual Activity  . Alcohol use: No    Alcohol/week: 0.0 standard drinks    Comment: "last drink was in 2012"( 03/15/2014)  . Drug use: No  . Sexual activity: Not Currently  Lifestyle  . Physical activity:    Days per week: Not on file    Minutes per session: Not on file  . Stress: Not on file  Relationships  . Social connections:    Talks on phone: Not on file    Gets together: Not on file    Attends religious service: Not on file    Active member of club or organization: Not on file    Attends meetings of clubs or organizations: Not on file    Relationship status: Not on file  . Intimate partner violence:    Fear of  current or ex partner: Not on file    Emotionally abused: Not on file    Physically abused: Not on file    Forced sexual activity: Not on file  Other Topics Concern  . Not on file  Social History Narrative   Divorced, lives with a roommate.   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      Allergies as of 04/01/2018      Reactions   Benazepril Swelling   angioedema; he is not a candidate for any angiotensin receptor  blockers because of this significant allergic reaction. Because of a history of documented adverse serious drug reaction;Medi Alert bracelet  is recommended   Hctz [hydrochlorothiazide] Anaphylaxis, Swelling   Tongue and lip swelling   Aspirin Other (See Comments)   Gastritis, cant take 325 Mg aspirin    Lactose Intolerance (gi) Nausea And Vomiting      Medication List        Accurate as of 04/01/18 11:59 PM. Always use your most recent med list.          allopurinol 300 MG tablet Commonly known as:  ZYLOPRIM Take 450 mg by mouth daily.   amLODipine 5 MG tablet Commonly known as:  NORVASC Take 1 tablet BID   amoxicillin 500 MG capsule Commonly known as:  AMOXIL Take 2 capsules (1,000 mg total) by mouth 2 (two) times daily.   atorvastatin 80 MG tablet Commonly known as:  LIPITOR TAKE ONE TABLET BY MOUTH AT BEDTIME   azelastine 0.1 % nasal spray Commonly known as:  ASTELIN Place 2 sprays into both nostrils at bedtime as needed for rhinitis. Use in each nostril as directed   ELIQUIS 5 MG Tabs tablet Generic drug:  apixaban TAKE 1 TABLET BY MOUTH TWICE A DAY   esomeprazole 40 MG capsule Commonly known as:  NEXIUM Take 1 capsule (40 mg total) by mouth daily.   ezetimibe 10 MG tablet Commonly known as:  ZETIA TAKE 1 TABLET BY MOUTH EVERY DAY   folic acid 1 MG tablet Commonly  known as:  FOLVITE Take 2 tablets (2 mg total) by mouth daily.   freestyle lancets Use BID to check blood sugar.  DX E11.9   furosemide 40 MG tablet Commonly known as:  LASIX TAKE 3 TABLETS (120 MG TOTAL) 2 (TWO) TIMES DAILY BY MOUTH.   glimepiride 2 MG tablet Commonly known as:  AMARYL Take 1 tablet (2 mg total) by mouth daily with breakfast. Need ov/lab work   glucose blood test strip TEST BLOOD SUGAR bid   LYRICA 75 MG capsule Generic drug:  pregabalin TAKE 1 CAPSULE BY MOUTH THREE TIMES A DAY   metFORMIN 1000 MG tablet Commonly known as:  GLUCOPHAGE TAKE ONE AND ONE-HALF TABLETS BY MOUTH EVERY MORNING AND 1 TALBET IN THE EVENING.   metoprolol tartrate 25 MG tablet Commonly known as:  LOPRESSOR TAKE ONE-HALF TABLET BY MOUTH TWICE DAILY   nitroGLYCERIN 0.4 MG SL tablet Commonly known as:  NITROSTAT Place 1 tablet (0.4 mg total) under the tongue every 5 (five) minutes as needed for chest pain.   polycarbophil 625 MG tablet Commonly known as:  FIBERCON Take 625 mg by mouth daily.   potassium chloride SA 20 MEQ tablet Commonly known as:  K-DUR,KLOR-CON Take 40 mEq by mouth 2 (two) times daily.   psyllium 58.6 % powder Commonly known as:  METAMUCIL Take 1 packet by mouth 3 (three) times daily.          Objective:   Physical Exam BP 126/72 (BP Location: Left Arm, Patient Position: Sitting, Cuff Size: Normal)   Pulse 67   Temp 97.7 F (36.5 C) (Oral)   Resp 16   Ht 6' (1.829 m)   Wt 244 lb 8 oz (110.9 kg)   SpO2 97%   BMI 33.16 kg/m       General:   Well developed, NAD, see BMI.  HEENT:  Normocephalic . Face symmetric, atraumatic.  Unable to see right TM due to wax, left TM normal.  Nose is  slightly congested.  Sinuses mildly TTP throughout.  Throat symmetric, no red, no discharge. Lungs:  CTA B Normal respiratory effort, no intercostal retractions, no accessory muscle use. Heart: RRR, mild systolic murmur.  No pretibial edema bilaterally  Skin: Not  pale. Not jaundice Neurologic:  alert & oriented X3.  Speech normal, gait appropriate for age and unassisted Psych--  Cognition and judgment appear intact.  Cooperative with normal attention span and concentration.  Behavior appropriate. No anxious or depressed appearing.   Assessment & Plan:   77 year old gentleman, PMH includes B -cell lymphoma , DM, B12 deficiency, pernicious anemia, iron deficiency, CAD, AoS s/p TVAR, GERD, gout, HTN, hyperlipidemia, sleep apnea, presents with the following:  URI: Symptoms as described above, vital signs are stable, nontoxic-appearing. Recommend supportive treatment first, if not better will start amoxicillin.  See AVS.  Patient in agreement.

## 2018-04-01 NOTE — Patient Instructions (Signed)
Rest, fluids , tylenol  If  cough:  Take Mucinex DM twice a day as needed until better  For nasal congestion: Use OTC  Flonase : 2 nasal sprays on each side of the nose in the morning until you feel better Use ASTELIN a prescribed spray : 2 nasal sprays on each side of the nose at night until you feel better   Avoid decongestants such as  Pseudoephedrine or phenylephrine    Take the antibiotic as prescribed  (Amoxicillin) if no better in 2-3 days    Call if not gradually better over the next  10 days  Call anytime if the symptoms are severe

## 2018-04-02 ENCOUNTER — Ambulatory Visit: Payer: Medicare Other

## 2018-04-03 ENCOUNTER — Inpatient Hospital Stay: Payer: Medicare Other | Attending: Hematology & Oncology

## 2018-04-03 VITALS — BP 129/75 | HR 71 | Temp 98.1°F | Resp 16

## 2018-04-03 DIAGNOSIS — D508 Other iron deficiency anemias: Secondary | ICD-10-CM

## 2018-04-03 DIAGNOSIS — D510 Vitamin B12 deficiency anemia due to intrinsic factor deficiency: Secondary | ICD-10-CM | POA: Diagnosis not present

## 2018-04-03 DIAGNOSIS — D519 Vitamin B12 deficiency anemia, unspecified: Secondary | ICD-10-CM

## 2018-04-03 MED ORDER — CYANOCOBALAMIN 1000 MCG/ML IJ SOLN
1000.0000 ug | Freq: Once | INTRAMUSCULAR | Status: AC
  Administered 2018-04-03: 1000 ug via INTRAMUSCULAR

## 2018-04-03 MED ORDER — CYANOCOBALAMIN 1000 MCG/ML IJ SOLN
INTRAMUSCULAR | Status: AC
  Filled 2018-04-03: qty 1

## 2018-04-03 NOTE — Patient Instructions (Signed)
Cyanocobalamin, Pyridoxine, and Folate What is this medicine? A multivitamin containing folic acid, vitamin B6, and vitamin B12. This medicine may be used for other purposes; ask your health care provider or pharmacist if you have questions. COMMON BRAND NAME(S): AllanFol RX, AllanTex, Av-Vite FB, ComBgen, FaBB, Folamin, Folastin, Folbalin, Folbee, Folbic, Folcaps, Folgard, Folgard RX, Folgard RX 2.2, Folplex, Folplex 2.2, Foltabs 800, Foltx, Homocysteine Formula, Niva-Fol, NuFol, TL Gard RX, Virt-Gard, Virt-Vite, Virt-Vite Forte, Vita-Respa What should I tell my health care provider before I take this medicine? They need to know if you have any of these conditions: -bleeding or clotting disorder -history of anemia of any type -other chronic health condition -an unusual or allergic reaction to vitamins, other medicines, foods, dyes, or preservatives -pregnant or trying to get pregnant -breast-feeding How should I use this medicine? Take by mouth with a glass of water. May take with food. Follow the directions on the prescription label. It is usually given once a day. Do not take your medicine more often than directed. Contact your pediatrician regarding the use of this medicine in children. Special care may be needed. Overdosage: If you think you have taken too much of this medicine contact a poison control center or emergency room at once. NOTE: This medicine is only for you. Do not share this medicine with others. What if I miss a dose? If you miss a dose, take it as soon as you can. If it is almost time for your next dose, take only that dose. Do not take double or extra doses. What may interact with this medicine? -levodopa This list may not describe all possible interactions. Give your health care provider a list of all the medicines, herbs, non-prescription drugs, or dietary supplements you use. Also tell them if you smoke, drink alcohol, or use illegal drugs. Some items may interact with  your medicine. What should I watch for while using this medicine? See your health care professional for regular checks on your progress. Remember that vitamin supplements do not replace the need for good nutrition from a balanced diet. What side effects may I notice from receiving this medicine? Side effects that you should report to your doctor or health care professional as soon as possible: -allergic reaction such as skin rash or difficulty breathing -vomiting Side effects that usually do not require medical attention (report to your doctor or health care professional if they continue or are bothersome): -nausea -stomach upset This list may not describe all possible side effects. Call your doctor for medical advice about side effects. You may report side effects to FDA at 1-800-FDA-1088. Where should I keep my medicine? Keep out of the reach of children. Most vitamins should be stored at controlled room temperature. Check your specific product directions. Protect from heat and moisture. Throw away any unused medicine after the expiration date. NOTE: This sheet is a summary. It may not cover all possible information. If you have questions about this medicine, talk to your doctor, pharmacist, or health care provider.  2018 Elsevier/Gold Standard (2007-08-08 00:59:55)  

## 2018-04-15 ENCOUNTER — Telehealth: Payer: Self-pay

## 2018-04-15 NOTE — Telephone Encounter (Signed)
Left message to confirm if clearance letter has been recv'd. Lmom to call our office if clearance letter has not been recv'd. I will remove from the call back pool.

## 2018-04-15 NOTE — Telephone Encounter (Signed)
   Primary Cardiologist: Minus Breeding, MD  Chart reviewed as part of pre-operative protocol coverage. Clearance was provided for the same procedure 03/2018. Given past medical history and time since last visit, based on ACC/AHA guidelines, Richard [name] would be at acceptable risk for the planned procedure without further cardiovascular testing.   Per office protocol, patient can hold Eliquis for 3 days prior to procedure. Eliquis should be restarted once cleared to do so by the surgery team.   I will route this recommendation to the requesting party via [place] fax function and remove from pre-op pool.  Please call with questions.  Callback: - Please ensure [facility] received clearance form.   Abigail Butts, PA-C 04/15/2018, 2:36 PM

## 2018-04-15 NOTE — Telephone Encounter (Signed)
   [facility] Pre-operative Risk Assessment    Request for surgical clearance:  1. What type of surgery is being performed?  Lumbar ES1   2. When is this surgery scheduled?  04/28/18   3. What type of clearance is required (medical clearance vs. Pharmacy clearance to hold med vs. Both)?  BOTH  4. Are there any medications that need to be held prior to surgery and how long? Eliquis 3 days   5. Practice name and name of physician performing surgery?  [facility]   6. What is your office phone number [phone] ex. [phone]    7.   What is your office fax number [phone]  8.   Anesthesia type (None, local, MAC, general) ?     Richard [name]  Richard [name] 04/15/2018, 10:31 AM  _________________________________________________________________   (provider comments below)

## 2018-04-16 ENCOUNTER — Other Ambulatory Visit: Payer: Self-pay | Admitting: Family Medicine

## 2018-04-16 DIAGNOSIS — E1140 Type 2 diabetes mellitus with diabetic neuropathy, unspecified: Secondary | ICD-10-CM

## 2018-04-20 ENCOUNTER — Other Ambulatory Visit: Payer: Self-pay | Admitting: Family Medicine

## 2018-04-20 DIAGNOSIS — E1140 Type 2 diabetes mellitus with diabetic neuropathy, unspecified: Secondary | ICD-10-CM

## 2018-04-20 MED ORDER — PREGABALIN 75 MG PO CAPS: ORAL_CAPSULE | ORAL | 1 refills | Status: DC

## 2018-04-20 NOTE — Telephone Encounter (Signed)
Patient called Richard [name] stating that he needs his Lyrica 75 mg TID, last refill 10/07/17, #[id number] rf, pharmacy [facility] on [address] Please Advise on refills/SLS 10/21

## 2018-04-25 ENCOUNTER — Other Ambulatory Visit: Payer: Self-pay | Admitting: Family Medicine

## 2018-04-25 DIAGNOSIS — IMO0002 Reserved for concepts with insufficient information to code with codable children: Secondary | ICD-10-CM

## 2018-04-25 DIAGNOSIS — E1165 Type 2 diabetes mellitus with hyperglycemia: Principal | ICD-10-CM

## 2018-04-25 DIAGNOSIS — E1151 Type 2 diabetes mellitus with diabetic peripheral angiopathy without gangrene: Secondary | ICD-10-CM

## 2018-04-28 DIAGNOSIS — M5136 Other intervertebral disc degeneration, lumbar region: Secondary | ICD-10-CM | POA: Diagnosis not present

## 2018-05-01 ENCOUNTER — Encounter: Payer: Self-pay | Admitting: Hematology & Oncology

## 2018-05-01 ENCOUNTER — Inpatient Hospital Stay: Payer: Medicare Other | Attending: Hematology & Oncology | Admitting: Hematology & Oncology

## 2018-05-01 ENCOUNTER — Inpatient Hospital Stay: Payer: Medicare Other

## 2018-05-01 ENCOUNTER — Other Ambulatory Visit: Payer: Self-pay

## 2018-05-01 VITALS — Wt 245.0 lb

## 2018-05-01 VITALS — BP 133/67 | HR 72 | Temp 97.8°F | Resp 18

## 2018-05-01 DIAGNOSIS — D510 Vitamin B12 deficiency anemia due to intrinsic factor deficiency: Secondary | ICD-10-CM | POA: Insufficient documentation

## 2018-05-01 DIAGNOSIS — C8332 Diffuse large B-cell lymphoma, intrathoracic lymph nodes: Secondary | ICD-10-CM | POA: Insufficient documentation

## 2018-05-01 DIAGNOSIS — Z7901 Long term (current) use of anticoagulants: Secondary | ICD-10-CM | POA: Diagnosis not present

## 2018-05-01 DIAGNOSIS — D519 Vitamin B12 deficiency anemia, unspecified: Secondary | ICD-10-CM

## 2018-05-01 DIAGNOSIS — D500 Iron deficiency anemia secondary to blood loss (chronic): Secondary | ICD-10-CM | POA: Insufficient documentation

## 2018-05-01 DIAGNOSIS — Z79899 Other long term (current) drug therapy: Secondary | ICD-10-CM | POA: Insufficient documentation

## 2018-05-01 DIAGNOSIS — I248 Other forms of acute ischemic heart disease: Secondary | ICD-10-CM | POA: Diagnosis not present

## 2018-05-01 DIAGNOSIS — Z7984 Long term (current) use of oral hypoglycemic drugs: Secondary | ICD-10-CM | POA: Diagnosis not present

## 2018-05-01 DIAGNOSIS — D508 Other iron deficiency anemias: Secondary | ICD-10-CM

## 2018-05-01 DIAGNOSIS — D518 Other vitamin B12 deficiency anemias: Secondary | ICD-10-CM

## 2018-05-01 LAB — CBC WITH DIFFERENTIAL (CANCER CENTER ONLY)
Abs Immature Granulocytes: 0.16 10*3/uL — ABNORMAL HIGH (ref 0.00–0.07)
BASOS PCT: 0 %
Basophils Absolute: 0 10*3/uL (ref 0.0–0.1)
EOS ABS: 0.1 10*3/uL (ref 0.0–0.5)
EOS PCT: 1 %
HCT: 39.9 % (ref 39.0–52.0)
Hemoglobin: 12.4 g/dL — ABNORMAL LOW (ref 13.0–17.0)
Immature Granulocytes: 2 %
Lymphocytes Relative: 12 %
Lymphs Abs: 1.1 10*3/uL (ref 0.7–4.0)
MCH: 30.1 pg (ref 26.0–34.0)
MCHC: 31.1 g/dL (ref 30.0–36.0)
MCV: 96.8 fL (ref 80.0–100.0)
MONO ABS: 0.9 10*3/uL (ref 0.1–1.0)
MONOS PCT: 10 %
NEUTROS ABS: 7 10*3/uL (ref 1.7–7.7)
Neutrophils Relative %: 75 %
PLATELETS: 133 10*3/uL — AB (ref 150–400)
RBC: 4.12 MIL/uL — AB (ref 4.22–5.81)
RDW: 15.5 % (ref 11.5–15.5)
WBC: 9.2 10*3/uL (ref 4.0–10.5)
nRBC: 0.3 % — ABNORMAL HIGH (ref 0.0–0.2)

## 2018-05-01 LAB — CMP (CANCER CENTER ONLY)
ALBUMIN: 4.2 g/dL (ref 3.5–5.0)
ALK PHOS: 114 U/L (ref 38–126)
ALT: 23 U/L (ref 0–44)
AST: 17 U/L (ref 15–41)
Anion gap: 13 (ref 5–15)
BILIRUBIN TOTAL: 0.8 mg/dL (ref 0.3–1.2)
BUN: 26 mg/dL — AB (ref 8–23)
CALCIUM: 9.8 mg/dL (ref 8.9–10.3)
CO2: 28 mmol/L (ref 22–32)
CREATININE: 1.25 mg/dL — AB (ref 0.61–1.24)
Chloride: 101 mmol/L (ref 98–111)
GFR, EST NON AFRICAN AMERICAN: 53 mL/min — AB (ref >60)
GFR, Est AFR Am: >60 mL/min (ref >60)
GLUCOSE: 173 mg/dL — AB (ref 70–99)
POTASSIUM: 3.7 mmol/L (ref 3.5–5.1)
Sodium: 142 mmol/L (ref 135–145)
TOTAL PROTEIN: 7.1 g/dL (ref 6.5–8.1)

## 2018-05-01 LAB — LACTATE DEHYDROGENASE: LDH: 238 U/L — ABNORMAL HIGH (ref 98–192)

## 2018-05-01 LAB — VITAMIN B12: VITAMIN B 12: 245 pg/mL (ref 180–914)

## 2018-05-01 MED ORDER — CYANOCOBALAMIN 1000 MCG/ML IJ SOLN: 1000.0000 ug | Freq: Once | INTRAMUSCULAR | Status: DC

## 2018-05-01 MED ORDER — CYANOCOBALAMIN 1000 MCG/ML IJ SOLN
INTRAMUSCULAR | Status: AC
  Filled 2018-05-01: qty 1

## 2018-05-01 MED ORDER — DENOSUMAB 120 MG/1.7ML ~~LOC~~ SOLN
SUBCUTANEOUS | Status: AC
  Filled 2018-05-01: qty 1.7

## 2018-05-01 MED ORDER — CYANOCOBALAMIN 1000 MCG/ML IJ SOLN
1000.0000 ug | Freq: Once | INTRAMUSCULAR | Status: AC
  Administered 2018-05-01: 1000 ug via INTRAMUSCULAR

## 2018-05-01 MED ORDER — HEPARIN SOD (PORK) LOCK FLUSH 100 UNIT/ML IV SOLN
500.0000 [IU] | Freq: Once | INTRAVENOUS | Status: AC
  Administered 2018-05-01: 500 [IU] via INTRAVENOUS
  Filled 2018-05-01: qty 5

## 2018-05-01 MED ORDER — DENOSUMAB 120 MG/1.7ML ~~LOC~~ SOLN
120.0000 mg | Freq: Once | SUBCUTANEOUS | Status: AC
  Administered 2018-05-01: 120 mg via SUBCUTANEOUS

## 2018-05-01 MED ORDER — SODIUM CHLORIDE 0.9% FLUSH
10.0000 mL | INTRAVENOUS | Status: DC | PRN
  Filled 2018-05-01: qty 10

## 2018-05-01 NOTE — Patient Instructions (Signed)
Cyanocobalamin, Vitamin B12 injection What is this medicine? CYANOCOBALAMIN (sye an oh koe BAL a min) is a man made form of vitamin B12. Vitamin B12 is used in the growth of healthy blood cells, nerve cells, and proteins in the body. It also helps with the metabolism of fats and carbohydrates. This medicine is used to treat people who can not absorb vitamin B12. This medicine may be used for other purposes; ask your health care provider or pharmacist if you have questions. COMMON BRAND NAME(S): B-12 Compliance Kit, B-12 Injection Kit, Cyomin, LA-12, Nutri-Twelve, Physicians EZ Use B-12, Primabalt What should I tell my health care provider before I take this medicine? They need to know if you have any of these conditions: -kidney disease -Leber's disease -megaloblastic anemia -an unusual or allergic reaction to cyanocobalamin, cobalt, other medicines, foods, dyes, or preservatives -pregnant or trying to get pregnant -breast-feeding How should I use this medicine? This medicine is injected into a muscle or deeply under the skin. It is usually given by a health care professional in a clinic or doctor's office. However, your doctor may teach you how to inject yourself. Follow all instructions. Talk to your pediatrician regarding the use of this medicine in children. Special care may be needed. Overdosage: If you think you have taken too much of this medicine contact a poison control center or emergency room at once. NOTE: This medicine is only for you. Do not share this medicine with others. What if I miss a dose? If you are given your dose at a clinic or doctor's office, call to reschedule your appointment. If you give your own injections and you miss a dose, take it as soon as you can. If it is almost time for your next dose, take only that dose. Do not take double or extra doses. What may interact with this medicine? -colchicine -heavy alcohol intake This list may not describe all possible  interactions. Give your health care provider a list of all the medicines, herbs, non-prescription drugs, or dietary supplements you use. Also tell them if you smoke, drink alcohol, or use illegal drugs. Some items may interact with your medicine. What should I watch for while using this medicine? Visit your doctor or health care professional regularly. You may need blood work done while you are taking this medicine. You may need to follow a special diet. Talk to your doctor. Limit your alcohol intake and avoid smoking to get the best benefit. What side effects may I notice from receiving this medicine? Side effects that you should report to your doctor or health care professional as soon as possible: -allergic reactions like skin rash, itching or hives, swelling of the face, lips, or tongue -blue tint to skin -chest tightness, pain -difficulty breathing, wheezing -dizziness -red, swollen painful area on the leg Side effects that usually do not require medical attention (report to your doctor or health care professional if they continue or are bothersome): -diarrhea -headache This list may not describe all possible side effects. Call your doctor for medical advice about side effects. You may report side effects to FDA at 1-800-FDA-1088. Where should I keep my medicine? Keep out of the reach of children. Store at room temperature between 15 and 30 degrees C (59 and 85 degrees F). Protect from light. Throw away any unused medicine after the expiration date. NOTE: This sheet is a summary. It may not cover all possible information. If you have questions about this medicine, talk to your doctor, pharmacist, or   health care provider.  2018 Elsevier/Gold Standard (2007-09-28 22:10:20) Denosumab injection What is this medicine? DENOSUMAB (den oh sue mab) slows bone breakdown. Prolia is used to treat osteoporosis in women after menopause and in men. Delton See is used to treat a high calcium level due to  cancer and to prevent bone fractures and other bone problems caused by multiple myeloma or cancer bone metastases. Delton See is also used to treat giant cell tumor of the bone. This medicine may be used for other purposes; ask your health care provider or pharmacist if you have questions. COMMON BRAND NAME(S): Prolia, XGEVA What should I tell my health care provider before I take this medicine? They need to know if you have any of these conditions: -dental disease -having surgery or tooth extraction -infection -kidney disease -low levels of calcium or Vitamin D in the blood -malnutrition -on hemodialysis -skin conditions or sensitivity -thyroid or parathyroid disease -an unusual reaction to denosumab, other medicines, foods, dyes, or preservatives -pregnant or trying to get pregnant -breast-feeding How should I use this medicine? This medicine is for injection under the skin. It is given by a health care professional in a hospital or clinic setting. If you are getting Prolia, a special MedGuide will be given to you by the pharmacist with each prescription and refill. Be sure to read this information carefully each time. For Prolia, talk to your pediatrician regarding the use of this medicine in children. Special care may be needed. For Delton See, talk to your pediatrician regarding the use of this medicine in children. While this drug may be prescribed for children as young as 13 years for selected conditions, precautions do apply. Overdosage: If you think you have taken too much of this medicine contact a poison control center or emergency room at once. NOTE: This medicine is only for you. Do not share this medicine with others. What if I miss a dose? It is important not to miss your dose. Call your doctor or health care professional if you are unable to keep an appointment. What may interact with this medicine? Do not take this medicine with any of the following medications: -other medicines  containing denosumab This medicine may also interact with the following medications: -medicines that lower your chance of fighting infection -steroid medicines like prednisone or cortisone This list may not describe all possible interactions. Give your health care provider a list of all the medicines, herbs, non-prescription drugs, or dietary supplements you use. Also tell them if you smoke, drink alcohol, or use illegal drugs. Some items may interact with your medicine. What should I watch for while using this medicine? Visit your doctor or health care professional for regular checks on your progress. Your doctor or health care professional may order blood tests and other tests to see how you are doing. Call your doctor or health care professional for advice if you get a fever, chills or sore throat, or other symptoms of a cold or flu. Do not treat yourself. This drug may decrease your body's ability to fight infection. Try to avoid being around people who are sick. You should make sure you get enough calcium and vitamin D while you are taking this medicine, unless your doctor tells you not to. Discuss the foods you eat and the vitamins you take with your health care professional. See your dentist regularly. Brush and floss your teeth as directed. Before you have any dental work done, tell your dentist you are receiving this medicine. Do not become pregnant  while taking this medicine or for 5 months after stopping it. Talk with your doctor or health care professional about your birth control options while taking this medicine. Women should inform their doctor if they wish to become pregnant or think they might be pregnant. There is a potential for serious side effects to an unborn child. Talk to your health care professional or pharmacist for more information. What side effects may I notice from receiving this medicine? Side effects that you should report to your doctor or health care professional as  soon as possible: -allergic reactions like skin rash, itching or hives, swelling of the face, lips, or tongue -bone pain -breathing problems -dizziness -jaw pain, especially after dental work -redness, blistering, peeling of the skin -signs and symptoms of infection like fever or chills; cough; sore throat; pain or trouble passing urine -signs of low calcium like fast heartbeat, muscle cramps or muscle pain; pain, tingling, numbness in the hands or feet; seizures -unusual bleeding or bruising -unusually weak or tired Side effects that usually do not require medical attention (report to your doctor or health care professional if they continue or are bothersome): -constipation -diarrhea -headache -joint pain -loss of appetite -muscle pain -runny nose -tiredness -upset stomach This list may not describe all possible side effects. Call your doctor for medical advice about side effects. You may report side effects to FDA at 1-800-FDA-1088. Where should I keep my medicine? This medicine is only given in a clinic, doctor's office, or other health care setting and will not be stored at home. NOTE: This sheet is a summary. It may not cover all possible information. If you have questions about this medicine, talk to your doctor, pharmacist, or health care provider.  2018 Elsevier/Gold Standard (2016-07-09 19:17:21)

## 2018-05-01 NOTE — Progress Notes (Signed)
Hematology and Oncology Follow Up Visit  LAVERT MATOUSEK [id number] 08/26/1940 77 y.o. 05/01/2018   Principle Diagnosis:  Diffuse large cell non-Hodgkin's lymphoma (IPI = 3) - NOT "double hit" Pernicious anemia Iron deficiency secondary to bleeding  Current Therapy:   R-CHOP - s/p cycle#8 Vitamin B12 1 mg IM every month Xgeva 120 mg subcu q 3 months - next dose in 07/2018 IV iron with Feraheme.  Last dose given 01/2018   Interim History:  Mr. Sedano is here today for follow-up.  He looks fantastic.  He feels so well.  I think a lot of this has to do with his aortic valve being repaired.   He is playing golf.  He plays 3 times a week.  He really has been doing quite nicely.  He has had no problems with bleeding.  He is on Eliquis.  He has had no cough or shortness of breath.  He has had no nausea or vomiting.  He did have a little bit of back pain.  He saw his orthopedist.  He received a steroid injection.  This really helped him.  He did get a dose of iron back in August.  At that time, his iron studies showed a ferritin of 1200 but his iron saturation was only 22%.  He does get his B12 injections every month.  Overall, his performance status is ECOG 1.  Medications:  Allergies as of 05/01/2018      Reactions   Benazepril Swelling   angioedema; he is not a candidate for any angiotensin receptor blockers because of this significant allergic reaction. Because of a history of documented adverse serious drug reaction;Medi Alert bracelet  is recommended   Hctz [hydrochlorothiazide] Anaphylaxis, Swelling   Tongue and lip swelling   Aspirin Other (See Comments)   Gastritis, cant take 325 Mg aspirin    Lactose Intolerance (gi) Nausea And Vomiting      Medication List        Accurate as of 05/01/18 11:58 AM. Always use your most recent med list.          allopurinol 300 MG tablet Commonly known as:  ZYLOPRIM Take 450 mg by mouth daily.   amLODipine 5 MG tablet Commonly  known as:  NORVASC Take 1 tablet BID   atorvastatin 80 MG tablet Commonly known as:  LIPITOR TAKE ONE TABLET BY MOUTH AT BEDTIME   azelastine 0.1 % nasal spray Commonly known as:  ASTELIN Place 2 sprays into both nostrils at bedtime as needed for rhinitis. Use in each nostril as directed   ELIQUIS 5 MG Tabs tablet Generic drug:  apixaban TAKE 1 TABLET BY MOUTH TWICE A DAY   esomeprazole 40 MG capsule Commonly known as:  NEXIUM Take 1 capsule (40 mg total) by mouth daily.   ezetimibe 10 MG tablet Commonly known as:  ZETIA TAKE 1 TABLET BY MOUTH EVERY DAY   folic acid 1 MG tablet Commonly known as:  FOLVITE Take 2 tablets (2 mg total) by mouth daily.   freestyle lancets Use BID to check blood sugar.  DX E11.9   furosemide 40 MG tablet Commonly known as:  LASIX TAKE 3 TABLETS (120 MG TOTAL) 2 (TWO) TIMES DAILY BY MOUTH.   glimepiride 2 MG tablet Commonly known as:  AMARYL Take 1 tablet (2 mg total) by mouth daily with breakfast. Need ov/lab work   glucose blood test strip TEST BLOOD SUGAR TWICE A DAY.  Due for follow up visit   metFORMIN 1000  MG tablet Commonly known as:  GLUCOPHAGE TAKE ONE AND ONE-HALF TABLETS BY MOUTH EVERY MORNING AND 1 TALBET IN THE EVENING.   metoprolol tartrate 25 MG tablet Commonly known as:  LOPRESSOR TAKE ONE-HALF TABLET BY MOUTH TWICE DAILY   nitroGLYCERIN 0.4 MG SL tablet Commonly known as:  NITROSTAT Place 1 tablet (0.4 mg total) under the tongue every 5 (five) minutes as needed for chest pain.   polycarbophil 625 MG tablet Commonly known as:  FIBERCON Take 625 mg by mouth daily.   potassium chloride SA 20 MEQ tablet Commonly known as:  K-DUR,KLOR-CON Take 40 mEq by mouth 2 (two) times daily.   pregabalin 75 MG capsule Commonly known as:  LYRICA TAKE 1 CAPSULE BY MOUTH THREE TIMES A DAY   psyllium 58.6 % powder Commonly known as:  METAMUCIL Take 1 packet by mouth 3 (three) times daily.       Allergies:  Allergies    Allergen Reactions  . Benazepril Swelling    angioedema; he is not a candidate for any angiotensin receptor blockers because of this significant allergic reaction. Because of a history of documented adverse serious drug reaction;Medi Alert bracelet  is recommended  . Hctz [Hydrochlorothiazide] Anaphylaxis and Swelling    Tongue and lip swelling   . Aspirin Other (See Comments)    Gastritis, cant take 325 Mg aspirin   . Lactose Intolerance (Gi) Nausea And Vomiting    Past Medical History, Surgical history, Social history, and Family History were reviewed and updated.  Review of Systems: Review of Systems  Constitutional: Negative.   HENT: Positive for congestion.   Eyes: Negative.   Respiratory: Negative.   Cardiovascular: Negative.   Gastrointestinal: Negative.   Genitourinary: Negative.   Musculoskeletal: Negative.   Skin: Negative.   Neurological: Negative.   Endo/Heme/Allergies: Negative.      Physical Exam:  weight is 245 lb (111.1 kg).   Wt Readings from Last 3 Encounters:  05/01/18 245 lb (111.1 kg)  04/01/18 244 lb 8 oz (110.9 kg)  01/29/18 242 lb 12 oz (110.1 kg)    Physical Exam  Constitutional: He is oriented to person, place, and time.  HENT:  Head: Normocephalic and atraumatic.  Mouth/Throat: Oropharynx is clear and moist.  Eyes: Pupils are equal, round, and reactive to light. EOM are normal.  Neck: Normal range of motion.  Cardiovascular: Normal rate, regular rhythm and normal heart sounds.  Pulmonary/Chest: Effort normal and breath sounds normal.  Abdominal: Soft. Bowel sounds are normal.  Musculoskeletal: Normal range of motion. He exhibits no edema, tenderness or deformity.  Lymphadenopathy:    He has no cervical adenopathy.  Neurological: He is alert and oriented to person, place, and time.  Skin: Skin is warm and dry. No rash noted. No erythema.  Psychiatric: He has a normal mood and affect. His behavior is normal. Judgment and thought content  normal.  Vitals reviewed.   Lab Results  Component Value Date   WBC 9.2 05/01/2018   HGB 12.4 (L) 05/01/2018   HCT 39.9 05/01/2018   MCV 96.8 05/01/2018   PLT 133 (L) 05/01/2018   Lab Results  Component Value Date   FERRITIN 1,199 (H) 01/29/2018   IRON 67 01/29/2018   TIBC 301 01/29/2018   UIBC 235 01/29/2018   IRONPCTSAT 22 (L) 01/29/2018   Lab Results  Component Value Date   RETICCTPCT 1.1 11/22/2011   RBC 4.12 (L) 05/01/2018   RETICCTABS 52.1 11/22/2011   No results found for: KPAFRELGTCHN, LAMBDASER, KAPLAMBRATIO  Lab Results  Component Value Date   IGA 159 03/09/2012   No results found for: Odetta Pink, SPEI   Chemistry      Component Value Date/Time   NA 137 01/29/2018 0843   NA 137 08/01/2017 0805   NA 144 06/27/2017 0857   K 3.3 01/29/2018 0843   K 3.9 06/27/2017 0857   CL 100 01/29/2018 0843   CL 103 06/27/2017 0857   CO2 30 01/29/2018 0843   CO2 26 06/27/2017 0857   BUN 25 (H) 01/29/2018 0843   BUN 12 08/01/2017 0805   BUN 12 06/27/2017 0857   CREATININE 1.10 01/29/2018 0843   CREATININE 1.0 06/27/2017 0857      Component Value Date/Time   CALCIUM 9.6 01/29/2018 0843   CALCIUM 9.2 06/27/2017 0857   ALKPHOS 94 (H) 01/29/2018 0843   ALKPHOS 128 (H) 06/27/2017 0857   AST 28 01/29/2018 0843   ALT 41 01/29/2018 0843   ALT 24 06/27/2017 0857   BILITOT 0.9 01/29/2018 0843      Impression and Plan: Mr. Riebel is a very pleasant 77 yo caucasian gentleman with diffuse large B-cell lymphoma (not double hit lymphoma).   Everything looks quite good.  He will come back monthly for B12 injection.  I will plan to see him back in 3 months.  [name], MD 11/1/201911:58 AM

## 2018-05-04 ENCOUNTER — Encounter: Payer: Self-pay | Admitting: *Deleted

## 2018-05-04 LAB — FERRITIN: FERRITIN: 1067 ng/mL — AB (ref 24–336)

## 2018-05-04 LAB — IRON AND TIBC
IRON: 104 ug/dL (ref 42–163)
Saturation Ratios: 33 % (ref 20–55)
TIBC: 313 ug/dL (ref 202–409)
UIBC: 209 ug/dL (ref 117–376)

## 2018-05-12 ENCOUNTER — Ambulatory Visit (INDEPENDENT_AMBULATORY_CARE_PROVIDER_SITE_OTHER): Payer: Medicare Other | Admitting: Family Medicine

## 2018-05-12 ENCOUNTER — Encounter: Payer: Self-pay | Admitting: Family Medicine

## 2018-05-12 VITALS — BP 133/59 | HR 64 | Temp 97.8°F | Resp 16 | Ht 72.0 in | Wt 249.6 lb

## 2018-05-12 DIAGNOSIS — E1140 Type 2 diabetes mellitus with diabetic neuropathy, unspecified: Secondary | ICD-10-CM | POA: Diagnosis not present

## 2018-05-12 DIAGNOSIS — E1142 Type 2 diabetes mellitus with diabetic polyneuropathy: Secondary | ICD-10-CM | POA: Diagnosis not present

## 2018-05-12 DIAGNOSIS — E785 Hyperlipidemia, unspecified: Secondary | ICD-10-CM

## 2018-05-12 DIAGNOSIS — I248 Other forms of acute ischemic heart disease: Secondary | ICD-10-CM

## 2018-05-12 DIAGNOSIS — J324 Chronic pansinusitis: Secondary | ICD-10-CM

## 2018-05-12 DIAGNOSIS — I10 Essential (primary) hypertension: Secondary | ICD-10-CM

## 2018-05-12 DIAGNOSIS — E1165 Type 2 diabetes mellitus with hyperglycemia: Secondary | ICD-10-CM | POA: Diagnosis not present

## 2018-05-12 MED ORDER — PREGABALIN 75 MG PO CAPS: ORAL_CAPSULE | ORAL | 1 refills | Status: DC

## 2018-05-12 MED ORDER — AMOXICILLIN-POT CLAVULANATE 875-125 MG PO TABS: 1.0000 | ORAL_TABLET | Freq: Two times a day (BID) | ORAL | 0 refills | Status: DC

## 2018-05-12 NOTE — Patient Instructions (Signed)

## 2018-05-12 NOTE — Progress Notes (Signed)
Patient ID: Richard [name], male    DOB: 1941-03-28  Age: 77 y.o. MRN: [id number]    Subjective:  Subjective  HPI ZEBULUN DEMAN presents for sinus congestion x 2 weeks   No fever +  Sinus pressure/ headache.  No cough  No otc He also is here for f/u dm, chol and bp.    HPI HYPERTENSION   Blood pressure range-not checking   Chest pain- no      Dyspnea- no Lightheadedness- no   Edema- no  Other side effects - no   Medication compliance: good Low salt diet- yes    DIABETES    Blood Sugar ranges-high per pt  Polyuria- no New Visual problems- no  Hypoglycemic symptoms- no  Other side effects-no Medication compliance - good Last eye exam- due    HYPERLIPIDEMIA  Medication compliance- good RUQ pain- no  Muscle aches- no Other side effects-no     Review of Systems  Constitutional: Positive for chills. Negative for fever.  HENT: Positive for congestion, postnasal drip, rhinorrhea, sinus pressure and sinus pain.   Respiratory: Negative for cough, chest tightness, shortness of breath and wheezing.   Cardiovascular: Negative for chest pain, palpitations and leg swelling.  Allergic/Immunologic: Negative for environmental allergies.  Neurological: Positive for numbness.    History Past Medical History:  Diagnosis Date  . Anemia   . Axillary adenopathy 02/25/2017  . Bradycardia    a. holter monitor has demonstrated HRs in 30s and [place]   . CAD (coronary artery disease)    a. s/p CABG 2001  b.  07/28/2017 cath:   Severe three-vessel native CAD with total occlusion of LAD, ramus intermedius, first OM and RCA, patent RIMA to PDA, LIMA to LAD, sequential SVG to ramus intermedius and first OM.    [name] Chronic lower back pain   . Diffuse large B cell lymphoma ([place])   . Diverticulosis   . Esophageal stricture   . GERD (gastroesophageal reflux disease)   . History of gout   . HTN (hypertension)   . Mixed hyperlipidemia   . OSA on CPAP    with 2L O2 at night  . Pancytopenia  ([place])    a. related to chemo therapy for B cell lymphoma  . Peptic stricture of esophagus   . Severe aortic stenosis   . Spinal stenosis   . Type II diabetes mellitus ([place])     He has a past surgical history that includes Vasectomy; Coronary artery bypass graft (03/2000); Esophageal dilation (X 3-4); Esophagogastroduodenoscopy; Flexible sigmoidoscopy; Panendoscopy; Knee arthroscopy (Left, 2011); Shoulder surgery (Right, 08/2010); Colonoscopy w/ biopsies and polypectomy (2013); Upper gi endoscopy (2013); Myelogram (04/06/13); Lumbar laminectomy/decompression microdiscectomy (Right, 06/17/2013); Back surgery; Appendectomy (~ 1952); Tonsillectomy (~ 1954); Coronary angioplasty (1993); Coronary angioplasty with stent (05/1997); Skin cancer excision (Right); left heart catheterization with coronary/graft angiogram (N/A, 03/16/2014); Cataract extraction w/PHACO (Left, 11/06/2016); Cataract extraction w/PHACO (Right, 12/04/2016); Hydradenitis excision (Left, 02/25/2017); Portacath placement (N/A, 03/06/2017); RIGHT/LEFT HEART CATH AND CORONARY/GRAFT ANGIOGRAPHY (N/A, 07/28/2017); Transcatheter aortic valve replacement, transfemoral (N/A, 09/09/2017); Intraoperative transthoracic echocardiogram (N/A, 09/09/2017); and PACEMAKER IMPLANT (N/A, 09/10/2017).   His family history includes Diabetes in his maternal grandmother; Heart attack in his paternal grandmother; Hypertension in his father; Pancreatic cancer in his mother; Stroke in his father and maternal grandmother.He reports that he has never smoked. He has never used smokeless tobacco. He reports that he does not drink alcohol or use drugs.  Current Outpatient Medications on File Prior to Visit  Medication  Sig Dispense Refill  . allopurinol (ZYLOPRIM) 300 MG tablet Take 450 mg by mouth daily.     [name] amLODipine (NORVASC) 5 MG tablet Take 1 tablet BID 180 tablet 3  . atorvastatin (LIPITOR) 80 MG tablet TAKE ONE TABLET BY MOUTH AT BEDTIME 90 tablet 3  . azelastine  (ASTELIN) 0.1 % nasal spray Place 2 sprays into both nostrils at bedtime as needed for rhinitis. Use in each nostril as directed 30 mL 3  . ELIQUIS 5 MG TABS tablet TAKE 1 TABLET BY MOUTH TWICE A DAY 180 tablet 1  . esomeprazole (NEXIUM) 40 MG capsule Take 1 capsule (40 mg total) by mouth daily. 30 capsule 5  . ezetimibe (ZETIA) 10 MG tablet TAKE 1 TABLET BY MOUTH EVERY DAY 90 tablet 3  . folic acid (FOLVITE) 1 MG tablet Take 2 tablets (2 mg total) by mouth daily. 60 tablet 12  . furosemide (LASIX) 40 MG tablet TAKE 3 TABLETS (120 MG TOTAL) 2 (TWO) TIMES DAILY BY MOUTH. 180 tablet 5  . glimepiride (AMARYL) 2 MG tablet Take 1 tablet (2 mg total) by mouth daily with breakfast. Need ov/lab work 90 tablet 0  . glucose blood (FREESTYLE TEST STRIPS) test strip TEST BLOOD SUGAR TWICE A DAY.  Due for follow up visit 100 each 0  . Lancets (FREESTYLE) lancets Use BID to check blood sugar.  DX E11.9 100 each 6  . metFORMIN (GLUCOPHAGE) 1000 MG tablet TAKE ONE AND ONE-HALF TABLETS BY MOUTH EVERY MORNING AND 1 TALBET IN THE EVENING. 225 tablet 1  . metoprolol tartrate (LOPRESSOR) 25 MG tablet TAKE ONE-HALF TABLET BY MOUTH TWICE DAILY 90 tablet 3  . nitroGLYCERIN (NITROSTAT) 0.4 MG SL tablet Place 1 tablet (0.4 mg total) under the tongue every 5 (five) minutes as needed for chest pain. 25 tablet 3  . polycarbophil (FIBERCON) 625 MG tablet Take 625 mg by mouth daily.    . potassium chloride SA (K-DUR,KLOR-CON) 20 MEQ tablet Take 40 mEq by mouth 2 (two) times daily.    . psyllium (METAMUCIL) 58.6 % powder Take 1 packet by mouth 3 (three) times daily.     Current Facility-Administered Medications on File Prior to Visit  Medication Dose Route Frequency Provider Last Rate Last Dose  . sodium chloride flush (NS) 0.9 % injection 10 mL  10 mL Intravenous PRN [name], MD   10 mL at 05/30/17 0920     Objective:  Objective  Physical Exam  Constitutional: He is oriented to person, place, and time. He appears  well-developed and well-nourished.  HENT:  Right Ear: External ear normal.  Left Ear: External ear normal.  Nose: Rhinorrhea present. Right sinus exhibits no maxillary sinus tenderness and no frontal sinus tenderness. Left sinus exhibits maxillary sinus tenderness and frontal sinus tenderness.  Mouth/Throat: Posterior oropharyngeal erythema present. No oropharyngeal exudate.  + PND + errythema  Eyes: Conjunctivae are normal. Right eye exhibits no discharge. Left eye exhibits no discharge.  Cardiovascular: Normal rate, regular rhythm and normal heart sounds.  No murmur heard. Pulmonary/Chest: Effort normal and breath sounds normal. No respiratory distress. He has no wheezes. He has no rales. He exhibits no tenderness.  Musculoskeletal: He exhibits no edema.  Lymphadenopathy:    He has cervical adenopathy.  Neurological: He is alert and oriented to person, place, and time.  Nursing note and vitals reviewed.  BP (!) 133/59 (BP Location: Left Arm, Cuff Size: Large)   Pulse 64   Temp 97.8 F (36.6 C) (Oral)  Resp 16   Ht 6' (1.829 m)   Wt 249 lb 9.6 oz (113.2 kg)   SpO2 97%   BMI 33.85 kg/m  Wt Readings from Last 3 Encounters:  05/12/18 249 lb 9.6 oz (113.2 kg)  05/01/18 245 lb (111.1 kg)  04/01/18 244 lb 8 oz (110.9 kg)     Lab Results  Component Value Date   WBC 9.2 05/01/2018   HGB 12.4 (L) 05/01/2018   HCT 39.9 05/01/2018   PLT 133 (L) 05/01/2018   GLUCOSE 92 05/12/2018   CHOL 152 05/12/2018   TRIG (H) 05/12/2018    438.0 Triglyceride is over 400; calculations on Lipids are invalid.   HDL 33.00 (L) 05/12/2018   LDLDIRECT 79.0 05/12/2018   LDLCALC 57 11/21/2016   ALT 15 05/12/2018   AST 14 05/12/2018   NA 140 05/12/2018   K 3.8 05/12/2018   CL 101 05/12/2018   CREATININE 0.98 05/12/2018   BUN 17 05/12/2018   CO2 29 05/12/2018   TSH 2.101 08/18/2017   PSA 0.65 09/27/2013   INR 1.15 11/15/2017   HGBA1C 6.5 05/12/2018   MICROALBUR 0.4 03/15/2016    Nm Pet  Image Restag (ps) Skull Base To Thigh  Result Date: 02/24/2018 CLINICAL DATA:  Subsequent treatment strategy for large B-cell lymphoma. EXAM: NUCLEAR MEDICINE PET SKULL BASE TO THIGH TECHNIQUE: 12.04 mCi F-18 FDG was injected intravenously. Full-ring PET imaging was performed from the skull base to thigh after the radiotracer. CT data was obtained and used for attenuation correction and anatomic localization. Fasting blood glucose: 158 mg/dl COMPARISON:  Multiple prior PET CTs.  The most recent is 09/01/2017 FINDINGS: Mediastinal blood pool activity: SUV max 2.54 NECK: No findings for recurrent neck adenopathy. Incidental CT findings: none CHEST: No residual or recurrent supraclavicular, axillary, subpectoral, mediastinal or hilar lymphadenopathy. Small subcutaneous cystic lesion noted superficial to surgical clips in the left axilla. On the prior study this was a simple appearing cyst without hypermetabolism. It is now much smaller but demonstrates mild hypermetabolism. I would wonder if this is infected or has been previously aspirated. Recommend clinical correlation. No worrisome pulmonary lesions. Incidental CT findings: Stable advanced atherosclerotic calcifications, tortuosity and ectasia of the thoracic aorta and stable cardiac enlargement, aortic valve replacement and extensive coronary artery calcifications. ABDOMEN/PELVIS: No abnormal hypermetabolic activity within the liver, pancreas, adrenal glands, or spleen. No hypermetabolic lymph nodes in the abdomen or pelvis. Incidental CT findings: Stable advanced atherosclerotic calcifications involving the abdominal aorta and iliac arteries. SKELETON: No focal hypermetabolic activity to suggest skeletal metastasis. Incidental CT findings: none IMPRESSION: 1. No PET-CT findings to suggest recurrent lymphoma in the neck, chest, abdomen or pelvis. 2. Subcutaneous lesion in the left axilla shows mild FDG uptake where previously was negative. It is also smaller and  appears more complex. I would wonder if this is infected or has been recently aspirated. Recommend clinical correlation. Electronically Signed   By: Marijo Sanes M.D.   On: 02/24/2018 13:22     Assessment & Plan:  Plan  I have changed Jamaar L. Riggle's pregabalin. I am also having him start on amoxicillin-clavulanate. Additionally, I am having him maintain his allopurinol, esomeprazole, nitroGLYCERIN, polycarbophil, freestyle, potassium chloride SA, psyllium, furosemide, ezetimibe, metFORMIN, amLODipine, folic acid, atorvastatin, metoprolol tartrate, glimepiride, ELIQUIS, azelastine, and glucose blood.  Meds ordered this encounter  Medications  . amoxicillin-clavulanate (AUGMENTIN) 875-125 MG tablet    Sig: Take 1 tablet by mouth 2 (two) times daily.    Dispense:  20 tablet  Refill:  0  . pregabalin (LYRICA) 75 MG capsule    Sig: Take 1 po q am , 1 po midday and 2 po qhs    Dispense:  360 capsule    Refill:  1    This request is for a new prescription for a controlled substance as required by Federal/State law..    Problem List Items Addressed This Visit      Unprioritized   Diabetic peripheral neuropathy associated with type 2 diabetes mellitus ([place])    Can inc to tid for lyrica  Pt requesting referral due to worsening symptoms       Relevant Medications   pregabalin (LYRICA) 75 MG capsule   Other Relevant Orders   Ambulatory referral to Neurology   Essential hypertension    Well controlled, no changes to meds. Encouraged heart healthy diet such as the DASH diet and exercise as tolerated.       Hyperlipidemia    Encouraged heart healthy diet, increase exercise, avoid trans fats, consider a krill oil cap daily      Pansinusitis - Primary    abx per orders  Pt has nasal spray- flonase and astelin rtoprn       Relevant Medications   amoxicillin-clavulanate (AUGMENTIN) 875-125 MG tablet   Uncontrolled type 2 diabetes mellitus with hyperglycemia ([facility])    Check  labs con't meds  Uncontrolled per pt readings      Relevant Orders   Lipid panel (Completed)   Comprehensive metabolic panel (Completed)   Hemoglobin A1c (Completed)    Other Visit Diagnoses    Type 2 diabetes mellitus with diabetic neuropathy, without long-term current use of insulin ([facility])       Relevant Medications   pregabalin (LYRICA) 75 MG capsule       Follow-up: Return in about 3 months (around 08/12/2018) for hypertension, hyperlipidemia, diabetes II, annual exam, fasting.  [name], DO

## 2018-05-13 DIAGNOSIS — E1165 Type 2 diabetes mellitus with hyperglycemia: Secondary | ICD-10-CM | POA: Insufficient documentation

## 2018-05-13 DIAGNOSIS — J324 Chronic pansinusitis: Secondary | ICD-10-CM | POA: Insufficient documentation

## 2018-05-13 DIAGNOSIS — E1142 Type 2 diabetes mellitus with diabetic polyneuropathy: Secondary | ICD-10-CM | POA: Insufficient documentation

## 2018-05-13 LAB — COMPREHENSIVE METABOLIC PANEL
ALBUMIN: 4.7 g/dL (ref 3.5–5.2)
ALK PHOS: 110 U/L (ref 39–117)
ALT: 15 U/L (ref 0–53)
AST: 14 U/L (ref 0–37)
BILIRUBIN TOTAL: 0.8 mg/dL (ref 0.2–1.2)
BUN: 17 mg/dL (ref 6–23)
CO2: 29 mEq/L (ref 19–32)
CREATININE: 0.98 mg/dL (ref 0.40–1.50)
Calcium: 9.7 mg/dL (ref 8.4–10.5)
Chloride: 101 mEq/L (ref 96–112)
GFR: 78.26 mL/min (ref >60.00)
GLUCOSE: 92 mg/dL (ref 70–99)
Potassium: 3.8 mEq/L (ref 3.5–5.1)
SODIUM: 140 meq/L (ref 135–145)
TOTAL PROTEIN: 6.7 g/dL (ref 6.0–8.3)

## 2018-05-13 LAB — LIPID PANEL
CHOL/HDL RATIO: 5
CHOLESTEROL: 152 mg/dL (ref 0–200)
HDL: 33 mg/dL — ABNORMAL LOW (ref >39.00)
Triglycerides: 438 mg/dL — ABNORMAL HIGH (ref 0.0–149.0)

## 2018-05-13 LAB — HEMOGLOBIN A1C: HEMOGLOBIN A1C: 6.5 % (ref 4.6–6.5)

## 2018-05-13 LAB — LDL CHOLESTEROL, DIRECT: LDL DIRECT: 79 mg/dL

## 2018-05-13 NOTE — Assessment & Plan Note (Signed)
Can inc to tid for lyrica  Pt requesting referral due to worsening symptoms

## 2018-05-13 NOTE — Assessment & Plan Note (Signed)
Well controlled, no changes to meds. Encouraged heart healthy diet such as the DASH diet and exercise as tolerated.  °

## 2018-05-13 NOTE — Assessment & Plan Note (Signed)
Encouraged heart healthy diet, increase exercise, avoid trans fats, consider a krill oil cap daily 

## 2018-05-13 NOTE — Assessment & Plan Note (Signed)
abx per orders  Pt has nasal spray- flonase and astelin rtoprn

## 2018-05-13 NOTE — Assessment & Plan Note (Signed)
Check labs con't meds  Uncontrolled per pt readings

## 2018-05-18 ENCOUNTER — Other Ambulatory Visit: Payer: Self-pay | Admitting: Family Medicine

## 2018-05-18 DIAGNOSIS — E785 Hyperlipidemia, unspecified: Secondary | ICD-10-CM

## 2018-05-18 DIAGNOSIS — IMO0002 Reserved for concepts with insufficient information to code with codable children: Secondary | ICD-10-CM

## 2018-05-18 DIAGNOSIS — E1151 Type 2 diabetes mellitus with diabetic peripheral angiopathy without gangrene: Secondary | ICD-10-CM

## 2018-05-18 DIAGNOSIS — E1165 Type 2 diabetes mellitus with hyperglycemia: Principal | ICD-10-CM

## 2018-05-18 MED ORDER — FENOFIBRATE 160 MG PO TABS: 160.0000 mg | ORAL_TABLET | Freq: Every day | ORAL | 3 refills | Status: DC

## 2018-05-19 DIAGNOSIS — M150 Primary generalized (osteo)arthritis: Secondary | ICD-10-CM | POA: Diagnosis not present

## 2018-05-19 DIAGNOSIS — E669 Obesity, unspecified: Secondary | ICD-10-CM | POA: Diagnosis not present

## 2018-05-19 DIAGNOSIS — M1A09X0 Idiopathic chronic gout, multiple sites, without tophus (tophi): Secondary | ICD-10-CM | POA: Diagnosis not present

## 2018-05-19 DIAGNOSIS — Z6836 Body mass index (BMI) 36.0-36.9, adult: Secondary | ICD-10-CM | POA: Diagnosis not present

## 2018-05-19 DIAGNOSIS — G5793 Unspecified mononeuropathy of bilateral lower limbs: Secondary | ICD-10-CM | POA: Diagnosis not present

## 2018-05-21 ENCOUNTER — Telehealth: Payer: Self-pay | Admitting: *Deleted

## 2018-05-21 DIAGNOSIS — H02132 Senile ectropion of right lower eyelid: Secondary | ICD-10-CM | POA: Diagnosis not present

## 2018-05-21 LAB — HM DIABETES EYE EXAM

## 2018-05-21 NOTE — Telephone Encounter (Signed)
Received Lab Report results from [facility]; forwarded to provider/SLS  11/21

## 2018-05-27 ENCOUNTER — Other Ambulatory Visit: Payer: Self-pay | Admitting: *Deleted

## 2018-05-27 DIAGNOSIS — C8332 Diffuse large B-cell lymphoma, intrathoracic lymph nodes: Secondary | ICD-10-CM

## 2018-05-27 DIAGNOSIS — D500 Iron deficiency anemia secondary to blood loss (chronic): Secondary | ICD-10-CM

## 2018-05-29 ENCOUNTER — Inpatient Hospital Stay: Payer: Medicare Other

## 2018-05-29 ENCOUNTER — Telehealth: Payer: Self-pay | Admitting: Hematology & Oncology

## 2018-05-29 VITALS — BP 139/63 | HR 79 | Temp 97.9°F

## 2018-05-29 DIAGNOSIS — Z7901 Long term (current) use of anticoagulants: Secondary | ICD-10-CM | POA: Diagnosis not present

## 2018-05-29 DIAGNOSIS — C8332 Diffuse large B-cell lymphoma, intrathoracic lymph nodes: Secondary | ICD-10-CM | POA: Diagnosis not present

## 2018-05-29 DIAGNOSIS — D500 Iron deficiency anemia secondary to blood loss (chronic): Secondary | ICD-10-CM

## 2018-05-29 DIAGNOSIS — I248 Other forms of acute ischemic heart disease: Secondary | ICD-10-CM | POA: Diagnosis not present

## 2018-05-29 DIAGNOSIS — D519 Vitamin B12 deficiency anemia, unspecified: Secondary | ICD-10-CM

## 2018-05-29 DIAGNOSIS — D508 Other iron deficiency anemias: Secondary | ICD-10-CM

## 2018-05-29 DIAGNOSIS — D510 Vitamin B12 deficiency anemia due to intrinsic factor deficiency: Secondary | ICD-10-CM | POA: Diagnosis not present

## 2018-05-29 DIAGNOSIS — Z95828 Presence of other vascular implants and grafts: Secondary | ICD-10-CM

## 2018-05-29 DIAGNOSIS — Z79899 Other long term (current) drug therapy: Secondary | ICD-10-CM | POA: Diagnosis not present

## 2018-05-29 LAB — CBC WITH DIFFERENTIAL (CANCER CENTER ONLY)
Abs Immature Granulocytes: 0.06 10*3/uL (ref 0.00–0.07)
Basophils Absolute: 0 10*3/uL (ref 0.0–0.1)
Basophils Relative: 0 %
Eosinophils Absolute: 0.2 10*3/uL (ref 0.0–0.5)
Eosinophils Relative: 2 %
HCT: 35.1 % — ABNORMAL LOW (ref 39.0–52.0)
HEMOGLOBIN: 11 g/dL — AB (ref 13.0–17.0)
IMMATURE GRANULOCYTES: 1 %
LYMPHS ABS: 1 10*3/uL (ref 0.7–4.0)
LYMPHS PCT: 15 %
MCH: 30.4 pg (ref 26.0–34.0)
MCHC: 31.3 g/dL (ref 30.0–36.0)
MCV: 97 fL (ref 80.0–100.0)
Monocytes Absolute: 0.7 10*3/uL (ref 0.1–1.0)
Monocytes Relative: 10 %
NEUTROS ABS: 4.9 10*3/uL (ref 1.7–7.7)
Neutrophils Relative %: 72 %
Platelet Count: 153 10*3/uL (ref 150–400)
RBC: 3.62 MIL/uL — AB (ref 4.22–5.81)
RDW: 15.5 % (ref 11.5–15.5)
WBC: 6.8 10*3/uL (ref 4.0–10.5)
nRBC: 0 % (ref 0.0–0.2)

## 2018-05-29 LAB — CMP (CANCER CENTER ONLY)
ALBUMIN: 4.5 g/dL (ref 3.5–5.0)
ALK PHOS: 84 U/L (ref 38–126)
ALT: 11 U/L (ref 0–44)
ANION GAP: 12 (ref 5–15)
AST: 13 U/L — AB (ref 15–41)
BUN: 19 mg/dL (ref 8–23)
CALCIUM: 9.1 mg/dL (ref 8.9–10.3)
CO2: 26 mmol/L (ref 22–32)
Chloride: 103 mmol/L (ref 98–111)
Creatinine: 1.27 mg/dL — ABNORMAL HIGH (ref 0.61–1.24)
GFR, Est AFR Am: >60 mL/min (ref >60)
GFR, Estimated: 53 mL/min — ABNORMAL LOW (ref >60)
GLUCOSE: 150 mg/dL — AB (ref 70–99)
Potassium: 3.8 mmol/L (ref 3.5–5.1)
SODIUM: 141 mmol/L (ref 135–145)
Total Bilirubin: 0.6 mg/dL (ref 0.3–1.2)
Total Protein: 6.6 g/dL (ref 6.5–8.1)

## 2018-05-29 MED ORDER — CYANOCOBALAMIN 1000 MCG/ML IJ SOLN
INTRAMUSCULAR | Status: AC
  Filled 2018-05-29: qty 1

## 2018-05-29 MED ORDER — HEPARIN SOD (PORK) LOCK FLUSH 100 UNIT/ML IV SOLN
500.0000 [IU] | Freq: Once | INTRAVENOUS | Status: AC
  Administered 2018-05-29: 500 [IU] via INTRAVENOUS
  Filled 2018-05-29: qty 5

## 2018-05-29 MED ORDER — CYANOCOBALAMIN 1000 MCG/ML IJ SOLN: 1000.0000 ug | Freq: Once | INTRAMUSCULAR | Status: DC

## 2018-05-29 MED ORDER — SODIUM CHLORIDE 0.9% FLUSH
10.0000 mL | INTRAVENOUS | Status: DC | PRN
  Administered 2018-05-29: 10 mL via INTRAVENOUS
  Filled 2018-05-29: qty 10

## 2018-05-29 NOTE — Patient Instructions (Signed)
Cyanocobalamin, Vitamin B12 injection What is this medicine? CYANOCOBALAMIN (sye an oh koe BAL a min) is a man made form of vitamin B12. Vitamin B12 is used in the growth of healthy blood cells, nerve cells, and proteins in the body. It also helps with the metabolism of fats and carbohydrates. This medicine is used to treat people who can not absorb vitamin B12. This medicine may be used for other purposes; ask your health care provider or pharmacist if you have questions. COMMON BRAND NAME(S): B-12 Compliance Kit, B-12 Injection Kit, Cyomin, LA-12, Nutri-Twelve, Physicians EZ Use B-12, Primabalt What should I tell my health care provider before I take this medicine? They need to know if you have any of these conditions: -kidney disease -Leber's disease -megaloblastic anemia -an unusual or allergic reaction to cyanocobalamin, cobalt, other medicines, foods, dyes, or preservatives -pregnant or trying to get pregnant -breast-feeding How should I use this medicine? This medicine is injected into a muscle or deeply under the skin. It is usually given by a health care professional in a clinic or doctor's office. However, your doctor may teach you how to inject yourself. Follow all instructions. Talk to your pediatrician regarding the use of this medicine in children. Special care may be needed. Overdosage: If you think you have taken too much of this medicine contact a poison control center or emergency room at once. NOTE: This medicine is only for you. Do not share this medicine with others. What if I miss a dose? If you are given your dose at a clinic or doctor's office, call to reschedule your appointment. If you give your own injections and you miss a dose, take it as soon as you can. If it is almost time for your next dose, take only that dose. Do not take double or extra doses. What may interact with this medicine? -colchicine -heavy alcohol intake This list may not describe all possible  interactions. Give your health care provider a list of all the medicines, herbs, non-prescription drugs, or dietary supplements you use. Also tell them if you smoke, drink alcohol, or use illegal drugs. Some items may interact with your medicine. What should I watch for while using this medicine? Visit your doctor or health care professional regularly. You may need blood work done while you are taking this medicine. You may need to follow a special diet. Talk to your doctor. Limit your alcohol intake and avoid smoking to get the best benefit. What side effects may I notice from receiving this medicine? Side effects that you should report to your doctor or health care professional as soon as possible: -allergic reactions like skin rash, itching or hives, swelling of the face, lips, or tongue -blue tint to skin -chest tightness, pain -difficulty breathing, wheezing -dizziness -red, swollen painful area on the leg Side effects that usually do not require medical attention (report to your doctor or health care professional if they continue or are bothersome): -diarrhea -headache This list may not describe all possible side effects. Call your doctor for medical advice about side effects. You may report side effects to FDA at 1-800-FDA-1088. Where should I keep my medicine? Keep out of the reach of children. Store at room temperature between 15 and 30 degrees C (59 and 85 degrees F). Protect from light. Throw away any unused medicine after the expiration date. NOTE: This sheet is a summary. It may not cover all possible information. If you have questions about this medicine, talk to your doctor, pharmacist, or   health care provider.  2018 Elsevier/Gold Standard (2007-09-28 22:10:20)

## 2018-05-29 NOTE — Patient Instructions (Signed)
Implanted Port Insertion, Care After °This sheet gives you information about how to care for yourself after your procedure. Your health care provider may also give you more specific instructions. If you have problems or questions, contact your health care provider. °What can I expect after the procedure? °After your procedure, it is common to have: °· Discomfort at the port insertion site. °· Bruising on the skin over the port. This should improve over 3-4 days. ° °Follow these instructions at home: °Port care °· After your port is placed, you will get a manufacturer's information card. The card has information about your port. Keep this card with you at all times. °· Take care of the port as told by your health care provider. Ask your health care provider if you or a family member can get training for taking care of the port at home. A home health care nurse may also take care of the port. °· Make sure to remember what type of port you have. °Incision care °· Follow instructions from your health care provider about how to take care of your port insertion site. Make sure you: °? Wash your hands with soap and water before you change your bandage (dressing). If soap and water are not available, use hand sanitizer. °? Change your dressing as told by your health care provider. °? Leave stitches (sutures), skin glue, or adhesive strips in place. These skin closures may need to stay in place for 2 weeks or longer. If adhesive strip edges start to loosen and curl up, you may trim the loose edges. Do not remove adhesive strips completely unless your health care provider tells you to do that. °· Check your port insertion site every day for signs of infection. Check for: °? More redness, swelling, or pain. °? More fluid or blood. °? Warmth. °? Pus or a bad smell. °General instructions °· Do not take baths, swim, or use a hot tub until your health care provider approves. °· Do not lift anything that is heavier than 10 lb (4.5  kg) for a week, or as told by your health care provider. °· Ask your health care provider when it is okay to: °? Return to work or school. °? Resume usual physical activities or sports. °· Do not drive for 24 hours if you were given a medicine to help you relax (sedative). °· Take over-the-counter and prescription medicines only as told by your health care provider. °· Wear a medical alert bracelet in case of an emergency. This will tell any health care providers that you have a port. °· Keep all follow-up visits as told by your health care provider. This is important. °Contact a health care provider if: °· You cannot flush your port with saline as directed, or you cannot draw blood from the port. °· You have a fever or chills. °· You have more redness, swelling, or pain around your port insertion site. °· You have more fluid or blood coming from your port insertion site. °· Your port insertion site feels warm to the touch. °· You have pus or a bad smell coming from the port insertion site. °Get help right away if: °· You have chest pain or shortness of breath. °· You have bleeding from your port that you cannot control. °Summary °· Take care of the port as told by your health care provider. °· Change your dressing as told by your health care provider. °· Keep all follow-up visits as told by your health care provider. °  This information is not intended to replace advice given to you by your health care provider. Make sure you discuss any questions you have with your health care provider. °Document Released: 04/07/2013 Document Revised: 05/08/2016 Document Reviewed: 05/08/2016 °Elsevier Interactive Patient Education © 2017 Elsevier Inc. ° °

## 2018-05-29 NOTE — Telephone Encounter (Signed)
Spoke with patient regarding change in lab appointment moved from 2:30 to 2:15 per 11/27 staff message

## 2018-06-04 ENCOUNTER — Other Ambulatory Visit: Payer: Self-pay | Admitting: Family Medicine

## 2018-06-04 DIAGNOSIS — IMO0002 Reserved for concepts with insufficient information to code with codable children: Secondary | ICD-10-CM

## 2018-06-04 DIAGNOSIS — E1151 Type 2 diabetes mellitus with diabetic peripheral angiopathy without gangrene: Secondary | ICD-10-CM

## 2018-06-04 DIAGNOSIS — E1165 Type 2 diabetes mellitus with hyperglycemia: Principal | ICD-10-CM

## 2018-06-11 ENCOUNTER — Encounter: Payer: Self-pay | Admitting: Family Medicine

## 2018-06-14 IMAGING — CT CT BIOPSY AND ASPIRATION BONE MARROW
1 of 2 series · 12 of 16 positions shown, 15 images · non-contrast
Comparison: none

INDICATION: Non-Hodgkin's lymphoma

[Series 2: i-spiral 5.0 b40f · axial · 0.95mm/px · z∈[-122,-13]mm · 12 of 37 slices shown, 15 images]
[im 3/37  soft-tissue]
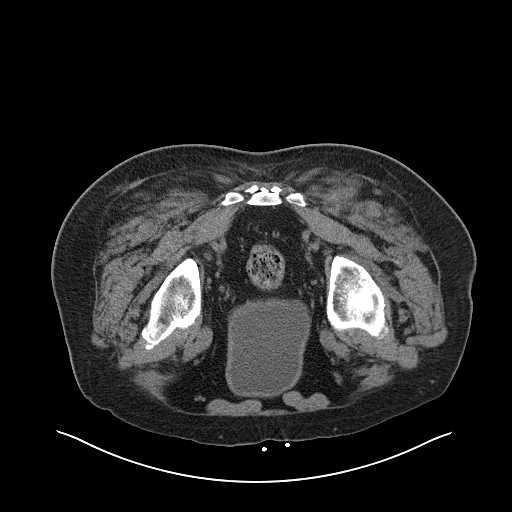
[im 3/37  bone]
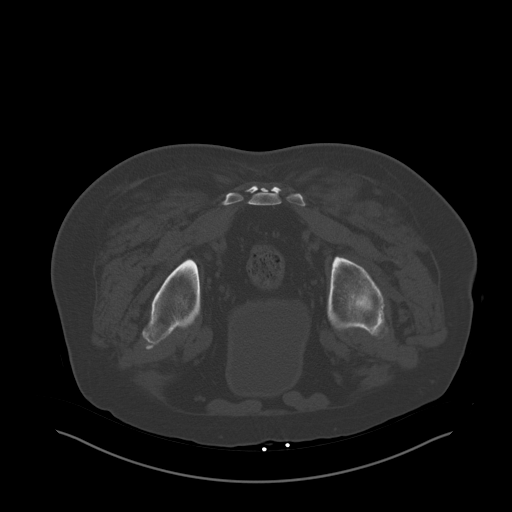
[im 6/37  soft-tissue]
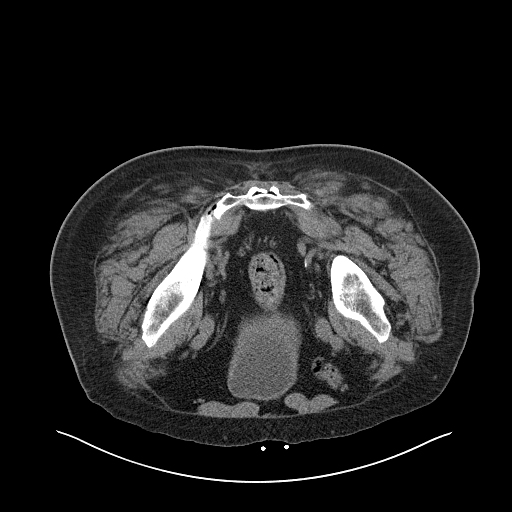
[im 9/37  soft-tissue]
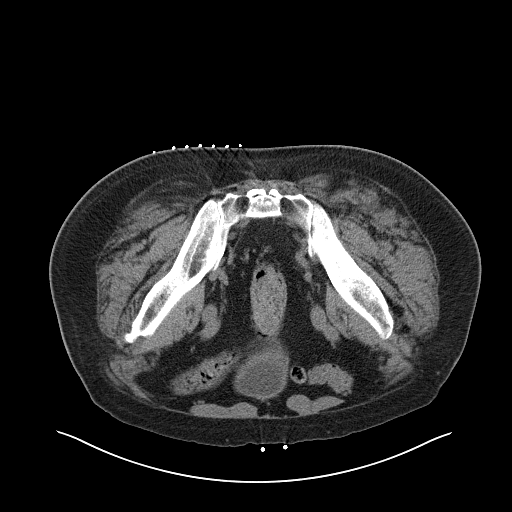
[im 12/37  soft-tissue]
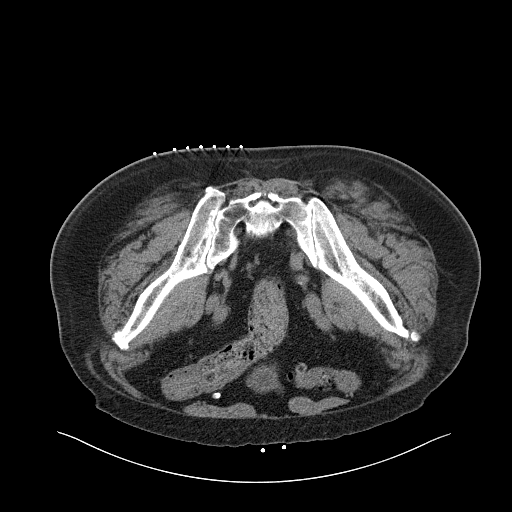
[im 14/37  soft-tissue]
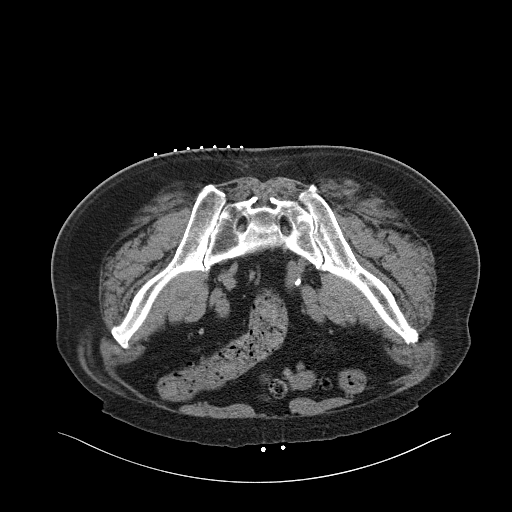
[im 14/37  bone]
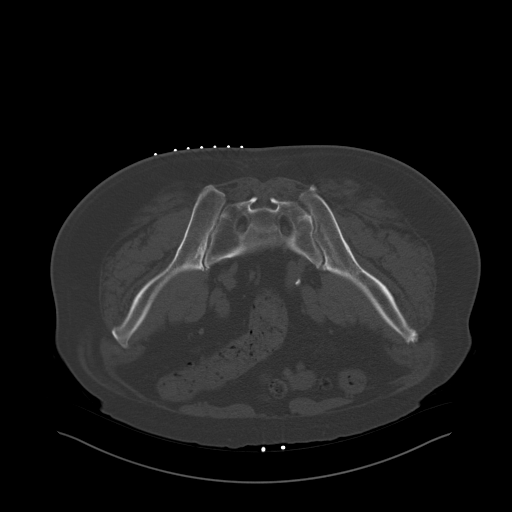
[im 17/37  soft-tissue]
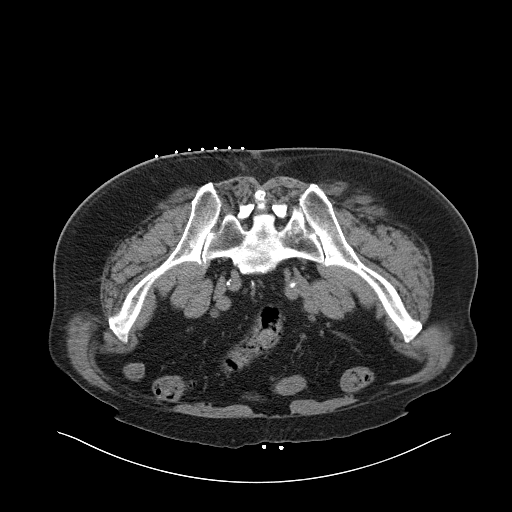
[im 20/37  soft-tissue]
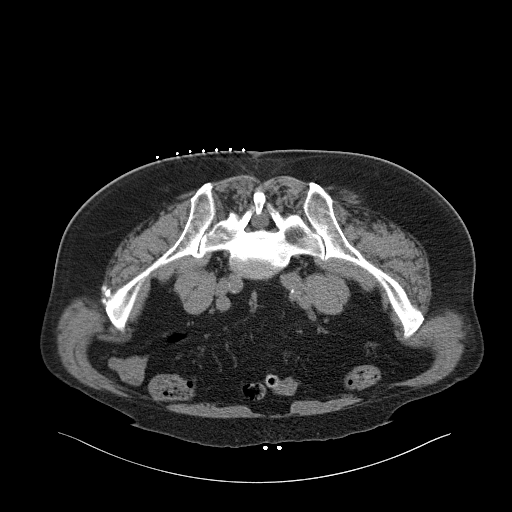
[im 23/37  soft-tissue]
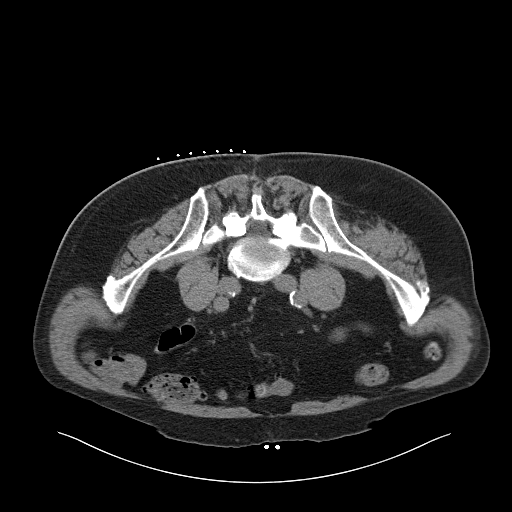
[im 25/37  soft-tissue]
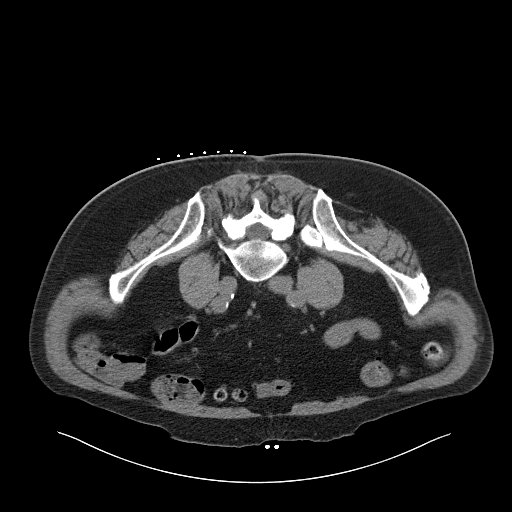
[im 25/37  bone]
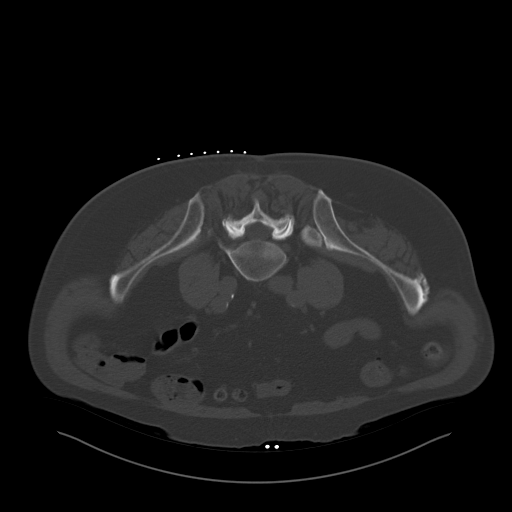
[im 28/37  soft-tissue]
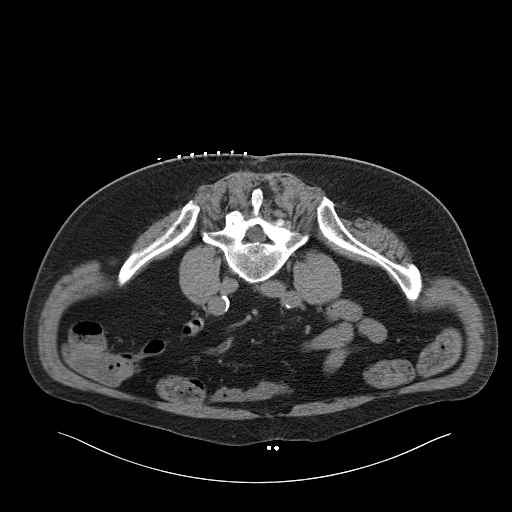
[im 31/37  soft-tissue]
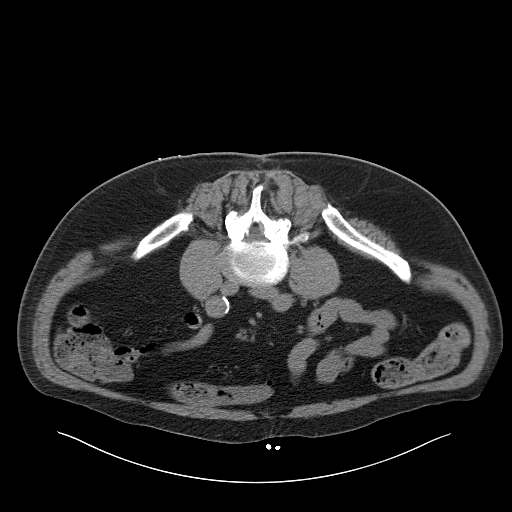
[im 34/37  soft-tissue]
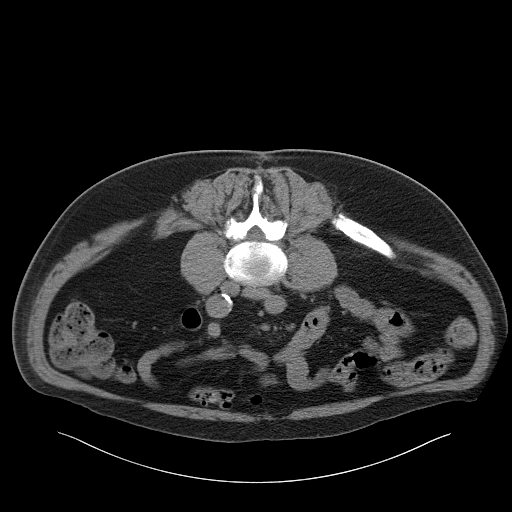

[12 of 16 positions shown; findings below may reference images not displayed]

EXAM:
CT GUIDED RIGHT ILIAC BONE MARROW ASPIRATION AND CORE BIOPSY

Radiologist:  Octavia, Myvi

Guidance:  CT

FLUOROSCOPY TIME:  Fluoroscopy Time: None.

MEDICATIONS:
None.

ANESTHESIA/SEDATION:
2.0 mg IV Versed; 100 mcg IV Fentanyl

Moderate Sedation Time:  10 minutes

The patient was continuously monitored during the procedure by the
interventional radiology nurse under my direct supervision.

CONTRAST:  None.

COMPLICATIONS:
None

PROCEDURE:
Informed consent was obtained from the patient following explanation
of the procedure, risks, benefits and alternatives. The patient
understands, agrees and consents for the procedure. All questions
were addressed. A time out was performed.

The patient was positioned prone and non-contrast localization CT
was performed of the pelvis to demonstrate the iliac marrow spaces.

Maximal barrier sterile technique utilized including caps, mask,
sterile gowns, sterile gloves, large sterile drape, hand hygiene,
and Betadine prep.

Under sterile conditions and local anesthesia, an 11 gauge coaxial
bone biopsy needle was advanced into the right iliac marrow space.
Needle position was confirmed with CT imaging. Initially, bone
marrow aspiration was performed. Next, the 11 gauge outer cannula
was utilized to obtain a right iliac bone marrow core biopsy. Needle
was removed. Hemostasis was obtained with compression. The patient
tolerated the procedure well. Samples were prepared with the
cytotechnologist. No immediate complications.
IMPRESSION: CT guided right iliac bone marrow aspiration and core biopsy.

## 2018-06-16 ENCOUNTER — Other Ambulatory Visit: Payer: Self-pay | Admitting: Physician Assistant

## 2018-06-25 ENCOUNTER — Other Ambulatory Visit: Payer: Medicare Other

## 2018-06-25 ENCOUNTER — Other Ambulatory Visit: Payer: Self-pay | Admitting: *Deleted

## 2018-06-25 DIAGNOSIS — D508 Other iron deficiency anemias: Secondary | ICD-10-CM

## 2018-06-26 ENCOUNTER — Inpatient Hospital Stay: Payer: Medicare Other

## 2018-06-26 ENCOUNTER — Inpatient Hospital Stay: Payer: Medicare Other | Attending: Hematology & Oncology

## 2018-06-26 DIAGNOSIS — D508 Other iron deficiency anemias: Secondary | ICD-10-CM

## 2018-06-26 DIAGNOSIS — D510 Vitamin B12 deficiency anemia due to intrinsic factor deficiency: Secondary | ICD-10-CM | POA: Insufficient documentation

## 2018-06-26 DIAGNOSIS — D519 Vitamin B12 deficiency anemia, unspecified: Secondary | ICD-10-CM

## 2018-06-26 LAB — CBC WITH DIFFERENTIAL (CANCER CENTER ONLY)
Abs Immature Granulocytes: 0.02 10*3/uL (ref 0.00–0.07)
BASOS PCT: 1 %
Basophils Absolute: 0 10*3/uL (ref 0.0–0.1)
Eosinophils Absolute: 0.1 10*3/uL (ref 0.0–0.5)
Eosinophils Relative: 2 %
HCT: 35.6 % — ABNORMAL LOW (ref 39.0–52.0)
Hemoglobin: 11 g/dL — ABNORMAL LOW (ref 13.0–17.0)
Immature Granulocytes: 1 %
Lymphocytes Relative: 32 %
Lymphs Abs: 1.2 10*3/uL (ref 0.7–4.0)
MCH: 29.9 pg (ref 26.0–34.0)
MCHC: 30.9 g/dL (ref 30.0–36.0)
MCV: 96.7 fL (ref 80.0–100.0)
Monocytes Absolute: 0.7 10*3/uL (ref 0.1–1.0)
Monocytes Relative: 19 %
Neutro Abs: 1.7 10*3/uL (ref 1.7–7.7)
Neutrophils Relative %: 45 %
Platelet Count: 145 10*3/uL — ABNORMAL LOW (ref 150–400)
RBC: 3.68 MIL/uL — ABNORMAL LOW (ref 4.22–5.81)
RDW: 15.6 % — ABNORMAL HIGH (ref 11.5–15.5)
WBC Count: 3.8 10*3/uL — ABNORMAL LOW (ref 4.0–10.5)
nRBC: 0 % (ref 0.0–0.2)

## 2018-06-26 LAB — CMP (CANCER CENTER ONLY)
ALBUMIN: 4.6 g/dL (ref 3.5–5.0)
ALT: 11 U/L (ref 0–44)
AST: 15 U/L (ref 15–41)
Alkaline Phosphatase: 61 U/L (ref 38–126)
Anion gap: 9 (ref 5–15)
BILIRUBIN TOTAL: 0.6 mg/dL (ref 0.3–1.2)
BUN: 22 mg/dL (ref 8–23)
CO2: 27 mmol/L (ref 22–32)
Calcium: 9.4 mg/dL (ref 8.9–10.3)
Chloride: 102 mmol/L (ref 98–111)
Creatinine: 1.6 mg/dL — ABNORMAL HIGH (ref 0.61–1.24)
GFR, Est AFR Am: 47 mL/min — ABNORMAL LOW (ref >60)
GFR, Estimated: 40 mL/min — ABNORMAL LOW (ref >60)
Glucose, Bld: 137 mg/dL — ABNORMAL HIGH (ref 70–99)
Potassium: 4 mmol/L (ref 3.5–5.1)
Sodium: 138 mmol/L (ref 135–145)
Total Protein: 6.9 g/dL (ref 6.5–8.1)

## 2018-06-26 MED ORDER — CYANOCOBALAMIN 1000 MCG/ML IJ SOLN
1000.0000 ug | Freq: Once | INTRAMUSCULAR | Status: AC
  Administered 2018-06-26: 1000 ug via INTRAMUSCULAR

## 2018-06-26 MED ORDER — CYANOCOBALAMIN 1000 MCG/ML IJ SOLN
INTRAMUSCULAR | Status: AC
  Filled 2018-06-26: qty 1

## 2018-06-26 NOTE — Patient Instructions (Signed)
Cyanocobalamin, Vitamin B12 injection What is this medicine? CYANOCOBALAMIN (sye an oh koe BAL a min) is a man made form of vitamin B12. Vitamin B12 is used in the growth of healthy blood cells, nerve cells, and proteins in the body. It also helps with the metabolism of fats and carbohydrates. This medicine is used to treat people who can not absorb vitamin B12. This medicine may be used for other purposes; ask your health care provider or pharmacist if you have questions. COMMON BRAND NAME(S): B-12 Compliance Kit, B-12 Injection Kit, Cyomin, LA-12, Nutri-Twelve, Physicians EZ Use B-12, Primabalt What should I tell my health care provider before I take this medicine? They need to know if you have any of these conditions: -kidney disease -Leber's disease -megaloblastic anemia -an unusual or allergic reaction to cyanocobalamin, cobalt, other medicines, foods, dyes, or preservatives -pregnant or trying to get pregnant -breast-feeding How should I use this medicine? This medicine is injected into a muscle or deeply under the skin. It is usually given by a health care professional in a clinic or doctor's office. However, your doctor may teach you how to inject yourself. Follow all instructions. Talk to your pediatrician regarding the use of this medicine in children. Special care may be needed. Overdosage: If you think you have taken too much of this medicine contact a poison control center or emergency room at once. NOTE: This medicine is only for you. Do not share this medicine with others. What if I miss a dose? If you are given your dose at a clinic or doctor's office, call to reschedule your appointment. If you give your own injections and you miss a dose, take it as soon as you can. If it is almost time for your next dose, take only that dose. Do not take double or extra doses. What may interact with this medicine? -colchicine -heavy alcohol intake This list may not describe all possible  interactions. Give your health care provider a list of all the medicines, herbs, non-prescription drugs, or dietary supplements you use. Also tell them if you smoke, drink alcohol, or use illegal drugs. Some items may interact with your medicine. What should I watch for while using this medicine? Visit your doctor or health care professional regularly. You may need blood work done while you are taking this medicine. You may need to follow a special diet. Talk to your doctor. Limit your alcohol intake and avoid smoking to get the best benefit. What side effects may I notice from receiving this medicine? Side effects that you should report to your doctor or health care professional as soon as possible: -allergic reactions like skin rash, itching or hives, swelling of the face, lips, or tongue -blue tint to skin -chest tightness, pain -difficulty breathing, wheezing -dizziness -red, swollen painful area on the leg Side effects that usually do not require medical attention (report to your doctor or health care professional if they continue or are bothersome): -diarrhea -headache This list may not describe all possible side effects. Call your doctor for medical advice about side effects. You may report side effects to FDA at 1-800-FDA-1088. Where should I keep my medicine? Keep out of the reach of children. Store at room temperature between 15 and 30 degrees C (59 and 85 degrees F). Protect from light. Throw away any unused medicine after the expiration date. NOTE: This sheet is a summary. It may not cover all possible information. If you have questions about this medicine, talk to your doctor, pharmacist, or   health care provider.  2019 Elsevier/Gold Standard (2007-09-28 22:10:20)  

## 2018-07-13 ENCOUNTER — Ambulatory Visit (INDEPENDENT_AMBULATORY_CARE_PROVIDER_SITE_OTHER): Payer: Medicare Other | Admitting: Family Medicine

## 2018-07-13 ENCOUNTER — Encounter: Payer: Self-pay | Admitting: Family Medicine

## 2018-07-13 VITALS — BP 116/62 | HR 67 | Temp 97.6°F | Resp 16 | Ht 72.0 in | Wt 252.2 lb

## 2018-07-13 DIAGNOSIS — J0141 Acute recurrent pansinusitis: Secondary | ICD-10-CM

## 2018-07-13 MED ORDER — AMOXICILLIN-POT CLAVULANATE 875-125 MG PO TABS: 1.0000 | ORAL_TABLET | Freq: Two times a day (BID) | ORAL | 0 refills | Status: DC

## 2018-07-13 NOTE — Progress Notes (Signed)
Patient ID: Richard [name], male    DOB: [birth year]  Age: 78 y.o. MRN: [id number]    Subjective:  Subjective  HPI Richard [name] presents for sinus congestion , pressure since last week   He is still using flonase.  He did completely get better in nov --- symptoms returned 5 days ago.   No fever He is having an eye procedure in march.    Review of Systems  Constitutional: Negative for appetite change, chills, diaphoresis, fatigue, fever and unexpected weight change.  HENT: Positive for congestion, postnasal drip, rhinorrhea, sinus pressure and sinus pain. Negative for sore throat.   Eyes: Negative for pain, redness and visual disturbance.  Respiratory: Negative for cough, chest tightness, shortness of breath and wheezing.   Cardiovascular: Negative for chest pain, palpitations and leg swelling.  Endocrine: Negative for cold intolerance, heat intolerance, polydipsia, polyphagia and polyuria.  Genitourinary: Negative for difficulty urinating, dysuria and frequency.  Allergic/Immunologic: Negative for environmental allergies.  Neurological: Negative for dizziness, light-headedness, numbness and headaches.    History Past Medical History:  Diagnosis Date  . Anemia   . Axillary adenopathy 02/25/2017  . Bradycardia    a. holter monitor has demonstrated HRs in 30s and [place]   . CAD (coronary artery disease)    a. s/p CABG 2001  b.  07/28/2017 cath:   Severe three-vessel native CAD with total occlusion of LAD, ramus intermedius, first OM and RCA, patent RIMA to PDA, LIMA to LAD, sequential SVG to ramus intermedius and first OM.    [name] Chronic lower back pain   . Diffuse large B cell lymphoma ([place])   . Diverticulosis   . Esophageal stricture   . GERD (gastroesophageal reflux disease)   . History of gout   . HTN (hypertension)   . Mixed hyperlipidemia   . OSA on CPAP    with 2L O2 at night  . Pancytopenia ([place])    a. related to chemo therapy for B cell lymphoma  . Peptic stricture of  esophagus   . Severe aortic stenosis   . Spinal stenosis   . Type II diabetes mellitus ([place])     He has a past surgical history that includes Vasectomy; Coronary artery bypass graft (03/2000); Esophageal dilation (X 3-4); Esophagogastroduodenoscopy; Flexible sigmoidoscopy; Panendoscopy; Knee arthroscopy (Left, 2011); Shoulder surgery (Right, 08/2010); Colonoscopy w/ biopsies and polypectomy (2013); Upper gi endoscopy (2013); Myelogram (04/06/13); Lumbar laminectomy/decompression microdiscectomy (Right, 06/17/2013); Back surgery; Appendectomy (~ 1952); Tonsillectomy (~ 1954); Coronary angioplasty (1993); Coronary angioplasty with stent (05/1997); Skin cancer excision (Right); left heart catheterization with coronary/graft angiogram (N/A, 03/16/2014); Cataract extraction w/PHACO (Left, 11/06/2016); Cataract extraction w/PHACO (Right, 12/04/2016); Hydradenitis excision (Left, 02/25/2017); Portacath placement (N/A, 03/06/2017); RIGHT/LEFT HEART CATH AND CORONARY/GRAFT ANGIOGRAPHY (N/A, 07/28/2017); Transcatheter aortic valve replacement, transfemoral (N/A, 09/09/2017); Intraoperative transthoracic echocardiogram (N/A, 09/09/2017); and PACEMAKER IMPLANT (N/A, 09/10/2017).   His family history includes Diabetes in his maternal grandmother; Heart attack in his paternal grandmother; Hypertension in his father; Pancreatic cancer in his mother; Stroke in his father and maternal grandmother.He reports that he has never smoked. He has never used smokeless tobacco. He reports that he does not drink alcohol or use drugs.  Current Outpatient Medications on File Prior to Visit  Medication Sig Dispense Refill  . allopurinol (ZYLOPRIM) 300 MG tablet Take 450 mg by mouth daily.     [name] amLODipine (NORVASC) 5 MG tablet Take 1 tablet BID 180 tablet 3  . atorvastatin (LIPITOR) 80 MG tablet TAKE ONE TABLET BY  MOUTH AT BEDTIME 90 tablet 3  . azelastine (ASTELIN) 0.1 % nasal spray Place 2 sprays into both nostrils at bedtime as needed for  rhinitis. Use in each nostril as directed 30 mL 3  . ELIQUIS 5 MG TABS tablet TAKE 1 TABLET BY MOUTH TWICE A DAY 180 tablet 1  . esomeprazole (NEXIUM) 40 MG capsule Take 1 capsule (40 mg total) by mouth daily. 30 capsule 5  . ezetimibe (ZETIA) 10 MG tablet TAKE 1 TABLET BY MOUTH EVERY DAY 90 tablet 3  . fenofibrate 160 MG tablet Take 1 tablet (160 mg total) by mouth daily. 30 tablet 3  . folic acid (FOLVITE) 1 MG tablet Take 2 tablets (2 mg total) by mouth daily. 60 tablet 12  . furosemide (LASIX) 40 MG tablet Take 3 tablets (120 mg total) by mouth 2 (two) times daily. 180 tablet 7  . glimepiride (AMARYL) 2 MG tablet TAKE 1 TABLET BY MOUTH DAILY WITH BREAKFAST. NEED OV/LAB WORK 90 tablet 0  . glucose blood (FREESTYLE TEST STRIPS) test strip TEST BLOOD SUGAR TWICE A DAY.  Due for follow up visit 100 each 0  . Lancets (FREESTYLE) lancets USE TWICE A DAY TO CHECK BLOOD SUGAR. DX E11.9 100 each 6  . metFORMIN (GLUCOPHAGE) 1000 MG tablet TAKE ONE AND ONE-HALF TABLETS BY MOUTH EVERY MORNING AND 1 TALBET IN THE EVENING. 225 tablet 1  . metoprolol tartrate (LOPRESSOR) 25 MG tablet TAKE ONE-HALF TABLET BY MOUTH TWICE DAILY 90 tablet 3  . nitroGLYCERIN (NITROSTAT) 0.4 MG SL tablet Place 1 tablet (0.4 mg total) under the tongue every 5 (five) minutes as needed for chest pain. 25 tablet 3  . polycarbophil (FIBERCON) 625 MG tablet Take 625 mg by mouth daily.    . potassium chloride SA (KLOR-CON M20) 20 MEQ tablet Take 2 tablets (40 mEq total) by mouth 2 (two) times daily. 120 tablet 7  . pregabalin (LYRICA) 75 MG capsule Take 1 po q am , 1 po midday and 2 po qhs 360 capsule 1  . psyllium (METAMUCIL) 58.6 % powder Take 1 packet by mouth 3 (three) times daily.     Current Facility-Administered Medications on File Prior to Visit  Medication Dose Route Frequency Provider Last Rate Last Dose  . sodium chloride flush (NS) 0.9 % injection 10 mL  10 mL Intravenous PRN [name], MD   10 mL at 05/30/17 0920      Objective:  Objective  Physical Exam Constitutional:      General: He is sleeping.     Appearance: He is well-developed.  HENT:     Head: Normocephalic and atraumatic.     Right Ear: External ear normal.     Left Ear: External ear normal.     Nose:     Right Sinus: Maxillary sinus tenderness and frontal sinus tenderness present.     Left Sinus: Maxillary sinus tenderness and frontal sinus tenderness present.  Eyes:     General:        Right eye: No discharge.        Left eye: No discharge.     Conjunctiva/sclera: Conjunctivae normal.     Pupils: Pupils are equal, round, and reactive to light.  Neck:     Musculoskeletal: Normal range of motion and neck supple.     Thyroid: No thyromegaly.  Cardiovascular:     Rate and Rhythm: Normal rate and regular rhythm.     Heart sounds: Normal heart sounds. No murmur.  Pulmonary:  Effort: Pulmonary effort is normal. No respiratory distress.     Breath sounds: Normal breath sounds. No wheezing or rales.  Chest:     Chest wall: No tenderness.  Musculoskeletal:        General: No tenderness.  Lymphadenopathy:     Cervical: Cervical adenopathy present.  Skin:    General: Skin is warm and dry.  Neurological:     Mental Status: He is oriented to person, place, and time.  Psychiatric:        Behavior: Behavior normal.        Thought Content: Thought content normal.        Judgment: Judgment normal.    BP 116/62 (BP Location: Right Arm, Cuff Size: Large)   Pulse 67   Temp 97.6 F (36.4 C) (Oral)   Resp 16   Ht 6' (1.829 m)   Wt 252 lb 3.2 oz (114.4 kg)   SpO2 95%   BMI 34.20 kg/m  Wt Readings from Last 3 Encounters:  07/13/18 252 lb 3.2 oz (114.4 kg)  05/12/18 249 lb 9.6 oz (113.2 kg)  05/01/18 245 lb (111.1 kg)     Lab Results  Component Value Date   WBC 3.8 (L) 06/26/2018   HGB 11.0 (L) 06/26/2018   HCT 35.6 (L) 06/26/2018   PLT 145 (L) 06/26/2018   GLUCOSE 137 (H) 06/26/2018   CHOL 152 05/12/2018   TRIG  (H) 05/12/2018    438.0 Triglyceride is over 400; calculations on Lipids are invalid.   HDL 33.00 (L) 05/12/2018   LDLDIRECT 79.0 05/12/2018   LDLCALC 57 11/21/2016   ALT 11 06/26/2018   AST 15 06/26/2018   NA 138 06/26/2018   K 4.0 06/26/2018   CL 102 06/26/2018   CREATININE 1.60 (H) 06/26/2018   BUN 22 06/26/2018   CO2 27 06/26/2018   TSH 2.101 08/18/2017   PSA 0.65 09/27/2013   INR 1.15 11/15/2017   HGBA1C 6.5 05/12/2018   MICROALBUR 0.4 03/15/2016    Nm Pet Image Restag (ps) Skull Base To Thigh  Result Date: 02/24/2018 CLINICAL DATA:  Subsequent treatment strategy for large B-cell lymphoma. EXAM: NUCLEAR MEDICINE PET SKULL BASE TO THIGH TECHNIQUE: 12.04 mCi F-18 FDG was injected intravenously. Full-ring PET imaging was performed from the skull base to thigh after the radiotracer. CT data was obtained and used for attenuation correction and anatomic localization. Fasting blood glucose: 158 mg/dl COMPARISON:  Multiple prior PET CTs.  The most recent is 09/01/2017 FINDINGS: Mediastinal blood pool activity: SUV max 2.54 NECK: No findings for recurrent neck adenopathy. Incidental CT findings: none CHEST: No residual or recurrent supraclavicular, axillary, subpectoral, mediastinal or hilar lymphadenopathy. Small subcutaneous cystic lesion noted superficial to surgical clips in the left axilla. On the prior study this was a simple appearing cyst without hypermetabolism. It is now much smaller but demonstrates mild hypermetabolism. I would wonder if this is infected or has been previously aspirated. Recommend clinical correlation. No worrisome pulmonary lesions. Incidental CT findings: Stable advanced atherosclerotic calcifications, tortuosity and ectasia of the thoracic aorta and stable cardiac enlargement, aortic valve replacement and extensive coronary artery calcifications. ABDOMEN/PELVIS: No abnormal hypermetabolic activity within the liver, pancreas, adrenal glands, or spleen. No  hypermetabolic lymph nodes in the abdomen or pelvis. Incidental CT findings: Stable advanced atherosclerotic calcifications involving the abdominal aorta and iliac arteries. SKELETON: No focal hypermetabolic activity to suggest skeletal metastasis. Incidental CT findings: none IMPRESSION: 1. No PET-CT findings to suggest recurrent lymphoma in the neck, chest, abdomen  or pelvis. 2. Subcutaneous lesion in the left axilla shows mild FDG uptake where previously was negative. It is also smaller and appears more complex. I would wonder if this is infected or has been recently aspirated. Recommend clinical correlation. Electronically Signed   By: Marijo Sanes M.D.   On: 02/24/2018 13:22     Assessment & Plan:  Plan  I have discontinued Richard [name]'s amoxicillin-clavulanate. I am also having him start on amoxicillin-clavulanate. Additionally, I am having him maintain his allopurinol, esomeprazole, nitroGLYCERIN, polycarbophil, psyllium, ezetimibe, metFORMIN, amLODipine, folic acid, atorvastatin, metoprolol tartrate, ELIQUIS, azelastine, glucose blood, pregabalin, glimepiride, fenofibrate, freestyle, furosemide, and potassium chloride SA.  Meds ordered this encounter  Medications  . amoxicillin-clavulanate (AUGMENTIN) 875-125 MG tablet    Sig: Take 1 tablet by mouth 2 (two) times daily.    Dispense:  20 tablet    Refill:  0    Problem List Items Addressed This Visit      Unprioritized   Pansinusitis - Primary   Relevant Medications   amoxicillin-clavulanate (AUGMENTIN) 875-125 MG tablet    con't flonase , can still use neti pot F/u or rto if symptoms worsen or do not improve  Follow-up: Return if symptoms worsen or fail to improve.  [name], DO

## 2018-07-13 NOTE — Patient Instructions (Signed)
Sinusitis, Adult  Sinusitis is inflammation of your sinuses. Sinuses are hollow spaces in the bones around your face. Your sinuses are located:   Around your eyes.   In the middle of your forehead.   Behind your nose.   In your cheekbones.  Mucus normally drains out of your sinuses. When your nasal tissues become inflamed or swollen, mucus can become trapped or blocked. This allows bacteria, viruses, and fungi to grow, which leads to infection. Most infections of the sinuses are caused by a virus.  Sinusitis can develop quickly. It can last for up to 4 weeks (acute) or for more than 12 weeks (chronic). Sinusitis often develops after a cold.  What are the causes?  This condition is caused by anything that creates swelling in the sinuses or stops mucus from draining. This includes:   Allergies.   Asthma.   Infection from bacteria or viruses.   Deformities or blockages in your nose or sinuses.   Abnormal growths in the nose (nasal polyps).   Pollutants, such as chemicals or irritants in the air.   Infection from fungi (rare).  What increases the risk?  You are more likely to develop this condition if you:   Have a weak body defense system (immune system).   Do a lot of swimming or diving.   Overuse nasal sprays.   Smoke.  What are the signs or symptoms?  The main symptoms of this condition are pain and a feeling of pressure around the affected sinuses. Other symptoms include:   Stuffy nose or congestion.   Thick drainage from your nose.   Swelling and warmth over the affected sinuses.   Headache.   Upper toothache.   A cough that may get worse at night.   Extra mucus that collects in the throat or the back of the nose (postnasal drip).   Decreased sense of smell and taste.   Fatigue.   A fever.   Sore throat.   Bad breath.  How is this diagnosed?  This condition is diagnosed based on:   Your symptoms.   Your medical history.   A physical exam.   Tests to find out if your condition is  acute or chronic. This may include:  ? Checking your nose for nasal polyps.  ? Viewing your sinuses using a device that has a light (endoscope).  ? Testing for allergies or bacteria.  ? Imaging tests, such as an MRI or CT scan.  In rare cases, a bone biopsy may be done to rule out more serious types of fungal sinus disease.  How is this treated?  Treatment for sinusitis depends on the cause and whether your condition is chronic or acute.   If caused by a virus, your symptoms should go away on their own within 10 days. You may be given medicines to relieve symptoms. They include:  ? Medicines that shrink swollen nasal passages (topical intranasal decongestants).  ? Medicines that treat allergies (antihistamines).  ? A spray that eases inflammation of the nostrils (topical intranasal corticosteroids).  ? Rinses that help get rid of thick mucus in your nose (nasal saline washes).   If caused by bacteria, your health care provider may recommend waiting to see if your symptoms improve. Most bacterial infections will get better without antibiotic medicine. You may be given antibiotics if you have:  ? A severe infection.  ? A weak immune system.   If caused by narrow nasal passages or nasal polyps, you may need   to have surgery.  Follow these instructions at home:  Medicines   Take, use, or apply over-the-counter and prescription medicines only as told by your health care provider. These may include nasal sprays.   If you were prescribed an antibiotic medicine, take it as told by your health care provider. Do not stop taking the antibiotic even if you start to feel better.  Hydrate and humidify     Drink enough fluid to keep your urine pale yellow. Staying hydrated will help to thin your mucus.   Use a cool mist humidifier to keep the humidity level in your home above 50%.   Inhale steam for 10-15 minutes, 3-4 times a day, or as told by your health care provider. You can do this in the bathroom while a hot shower is  running.   Limit your exposure to cool or dry air.  Rest   Rest as much as possible.   Sleep with your head raised (elevated).   Make sure you get enough sleep each night.  General instructions     Apply a warm, moist washcloth to your face 3-4 times a day or as told by your health care provider. This will help with discomfort.   Wash your hands often with soap and water to reduce your exposure to germs. If soap and water are not available, use hand sanitizer.   Do not smoke. Avoid being around people who are smoking (secondhand smoke).   Keep all follow-up visits as told by your health care provider. This is important.  Contact a health care provider if:   You have a fever.   Your symptoms get worse.   Your symptoms do not improve within 10 days.  Get help right away if:   You have a severe headache.   You have persistent vomiting.   You have severe pain or swelling around your face or eyes.   You have vision problems.   You develop confusion.   Your neck is stiff.   You have trouble breathing.  Summary   Sinusitis is soreness and inflammation of your sinuses. Sinuses are hollow spaces in the bones around your face.   This condition is caused by nasal tissues that become inflamed or swollen. The swelling traps or blocks the flow of mucus. This allows bacteria, viruses, and fungi to grow, which leads to infection.   If you were prescribed an antibiotic medicine, take it as told by your health care provider. Do not stop taking the antibiotic even if you start to feel better.   Keep all follow-up visits as told by your health care provider. This is important.  This information is not intended to replace advice given to you by your health care provider. Make sure you discuss any questions you have with your health care provider.  Document Released: 06/17/2005 Document Revised: 11/17/2017 Document Reviewed: 11/17/2017  [employer] Interactive Patient Education  2019 [employer]

## 2018-07-20 DIAGNOSIS — M5136 Other intervertebral disc degeneration, lumbar region: Secondary | ICD-10-CM | POA: Diagnosis not present

## 2018-07-20 DIAGNOSIS — M25562 Pain in left knee: Secondary | ICD-10-CM | POA: Diagnosis not present

## 2018-07-24 ENCOUNTER — Inpatient Hospital Stay: Payer: Medicare Other

## 2018-07-24 ENCOUNTER — Other Ambulatory Visit: Payer: Self-pay

## 2018-07-24 ENCOUNTER — Inpatient Hospital Stay: Payer: Medicare Other | Attending: Hematology & Oncology | Admitting: Family

## 2018-07-24 ENCOUNTER — Encounter: Payer: Self-pay | Admitting: Family

## 2018-07-24 VITALS — BP 130/64 | HR 80 | Temp 97.8°F | Resp 16 | Wt 247.0 lb

## 2018-07-24 DIAGNOSIS — C8332 Diffuse large B-cell lymphoma, intrathoracic lymph nodes: Secondary | ICD-10-CM

## 2018-07-24 DIAGNOSIS — G629 Polyneuropathy, unspecified: Secondary | ICD-10-CM | POA: Insufficient documentation

## 2018-07-24 DIAGNOSIS — D519 Vitamin B12 deficiency anemia, unspecified: Secondary | ICD-10-CM

## 2018-07-24 DIAGNOSIS — D500 Iron deficiency anemia secondary to blood loss (chronic): Secondary | ICD-10-CM | POA: Diagnosis not present

## 2018-07-24 DIAGNOSIS — D510 Vitamin B12 deficiency anemia due to intrinsic factor deficiency: Secondary | ICD-10-CM

## 2018-07-24 DIAGNOSIS — D518 Other vitamin B12 deficiency anemias: Secondary | ICD-10-CM

## 2018-07-24 DIAGNOSIS — Z954 Presence of other heart-valve replacement: Secondary | ICD-10-CM

## 2018-07-24 DIAGNOSIS — D508 Other iron deficiency anemias: Secondary | ICD-10-CM

## 2018-07-24 LAB — CMP (CANCER CENTER ONLY)
ALBUMIN: 5.1 g/dL — AB (ref 3.5–5.0)
ALT: 11 U/L (ref 0–44)
AST: 12 U/L — ABNORMAL LOW (ref 15–41)
Alkaline Phosphatase: 58 U/L (ref 38–126)
Anion gap: 12 (ref 5–15)
BUN: 35 mg/dL — ABNORMAL HIGH (ref 8–23)
CHLORIDE: 100 mmol/L (ref 98–111)
CO2: 28 mmol/L (ref 22–32)
Calcium: 10.5 mg/dL — ABNORMAL HIGH (ref 8.9–10.3)
Creatinine: 1.66 mg/dL — ABNORMAL HIGH (ref 0.61–1.24)
GFR, Est AFR Am: 44 mL/min — ABNORMAL LOW (ref >60)
GFR, Estimated: 38 mL/min — ABNORMAL LOW (ref >60)
Glucose, Bld: 68 mg/dL — ABNORMAL LOW (ref 70–99)
POTASSIUM: 3.6 mmol/L (ref 3.5–5.1)
Sodium: 140 mmol/L (ref 135–145)
Total Bilirubin: 0.5 mg/dL (ref 0.3–1.2)
Total Protein: 7.2 g/dL (ref 6.5–8.1)

## 2018-07-24 LAB — CBC WITH DIFFERENTIAL (CANCER CENTER ONLY)
Abs Immature Granulocytes: 0.17 10*3/uL — ABNORMAL HIGH (ref 0.00–0.07)
BASOS PCT: 1 %
Basophils Absolute: 0.1 10*3/uL (ref 0.0–0.1)
Eosinophils Absolute: 0.2 10*3/uL (ref 0.0–0.5)
Eosinophils Relative: 2 %
HCT: 38.4 % — ABNORMAL LOW (ref 39.0–52.0)
Hemoglobin: 12.3 g/dL — ABNORMAL LOW (ref 13.0–17.0)
Immature Granulocytes: 2 %
Lymphocytes Relative: 19 %
Lymphs Abs: 2 10*3/uL (ref 0.7–4.0)
MCH: 30.1 pg (ref 26.0–34.0)
MCHC: 32 g/dL (ref 30.0–36.0)
MCV: 93.9 fL (ref 80.0–100.0)
Monocytes Absolute: 0.9 10*3/uL (ref 0.1–1.0)
Monocytes Relative: 8 %
NEUTROS ABS: 7.1 10*3/uL (ref 1.7–7.7)
Neutrophils Relative %: 68 %
Platelet Count: 206 10*3/uL (ref 150–400)
RBC: 4.09 MIL/uL — ABNORMAL LOW (ref 4.22–5.81)
RDW: 15.3 % (ref 11.5–15.5)
WBC: 10.4 10*3/uL (ref 4.0–10.5)
nRBC: 0 % (ref 0.0–0.2)

## 2018-07-24 LAB — RETICULOCYTES
IMMATURE RETIC FRACT: 26.2 % — AB (ref 2.3–15.9)
RBC.: 4.09 MIL/uL — ABNORMAL LOW (ref 4.22–5.81)
RETIC COUNT ABSOLUTE: 75.7 10*3/uL (ref 19.0–186.0)
Retic Ct Pct: 1.9 % (ref 0.4–3.1)

## 2018-07-24 LAB — VITAMIN B12: Vitamin B-12: 159 pg/mL — ABNORMAL LOW (ref 180–914)

## 2018-07-24 MED ORDER — CYANOCOBALAMIN 1000 MCG/ML IJ SOLN
1000.0000 ug | Freq: Once | INTRAMUSCULAR | Status: AC
  Administered 2018-07-24: 1000 ug via INTRAMUSCULAR

## 2018-07-24 MED ORDER — DENOSUMAB 120 MG/1.7ML ~~LOC~~ SOLN
SUBCUTANEOUS | Status: AC
  Filled 2018-07-24: qty 1.7

## 2018-07-24 MED ORDER — DENOSUMAB 120 MG/1.7ML ~~LOC~~ SOLN
120.0000 mg | Freq: Once | SUBCUTANEOUS | Status: AC
  Administered 2018-07-24: 120 mg via SUBCUTANEOUS

## 2018-07-24 NOTE — Progress Notes (Signed)
Hematology and Oncology Follow Up Visit  Richard [name] [id number] 01/22/1941 78 y.o. 07/24/2018   Principle Diagnosis:  Diffuse large cell non-Hodgkin's lymphoma (IPI = 3) - NOT "double hit" Pernicious anemia Iron deficiency secondary to bleeding  Current Therapy:   R-CHOP - s/p cycle8 Vitamin B12 1 mg IM every month Xgeva 120 mg subcu q 3 months  IV iron with Feraheme   Interim History:  Richard [name] is here today for follow-up and treatment. Since having his aortic valve replacement last year he states he feels like a new person.  He is golfing regularly and staying busy at home.  No fever, chills, n/v, cough, rash, dizziness, SOB, chest pain, palpitations, abdominal pain or changes in bowel or bladder habits.  No swelling or tenderness in her extremities.  The neuropathy in his feet is stable and unchanged.  No lymphadenopathy noted on exam.  No episodes of bleeding, no bruising or petechiae.  He has maintained a good appetite despite his taste being bland. He is hydrating well and his weight is stable.   ECOG Performance Status: 1 - Symptomatic but completely ambulatory  Medications:  Allergies as of 07/24/2018      Reactions   Benazepril Swelling   angioedema; he is not a candidate for any angiotensin receptor blockers because of this significant allergic reaction. Because of a history of documented adverse serious drug reaction;Medi Alert bracelet  is recommended   Hctz [hydrochlorothiazide] Anaphylaxis, Swelling   Tongue and lip swelling   Aspirin Other (See Comments)   Gastritis, cant take 325 Mg aspirin    Lactose Intolerance (gi) Nausea And Vomiting      Medication List       Accurate as of July 24, 2018  2:53 PM. Always use your most recent med list.        allopurinol 300 MG tablet Commonly known as:  ZYLOPRIM Take 450 mg by mouth daily.   amLODipine 5 MG tablet Commonly known as:  NORVASC Take 1 tablet BID   amoxicillin-clavulanate 875-125 MG  tablet Commonly known as:  AUGMENTIN Take 1 tablet by mouth 2 (two) times daily.   atorvastatin 80 MG tablet Commonly known as:  LIPITOR TAKE ONE TABLET BY MOUTH AT BEDTIME   azelastine 0.1 % nasal spray Commonly known as:  ASTELIN Place 2 sprays into both nostrils at bedtime as needed for rhinitis. Use in each nostril as directed   ELIQUIS 5 MG Tabs tablet Generic drug:  apixaban TAKE 1 TABLET BY MOUTH TWICE A DAY   esomeprazole 40 MG capsule Commonly known as:  NEXIUM Take 1 capsule (40 mg total) by mouth daily.   ezetimibe 10 MG tablet Commonly known as:  ZETIA TAKE 1 TABLET BY MOUTH EVERY DAY   fenofibrate 160 MG tablet Take 1 tablet (160 mg total) by mouth daily.   folic acid 1 MG tablet Commonly known as:  FOLVITE Take 2 tablets (2 mg total) by mouth daily.   freestyle lancets USE TWICE A DAY TO CHECK BLOOD SUGAR. DX E11.9   furosemide 40 MG tablet Commonly known as:  LASIX Take 3 tablets (120 mg total) by mouth 2 (two) times daily.   glimepiride 2 MG tablet Commonly known as:  AMARYL TAKE 1 TABLET BY MOUTH DAILY WITH BREAKFAST. NEED OV/LAB WORK   glucose blood test strip Commonly known as:  FREESTYLE TEST STRIPS TEST BLOOD SUGAR TWICE A DAY.  Due for follow up visit   metFORMIN 1000 MG tablet Commonly known as:  GLUCOPHAGE TAKE ONE AND ONE-HALF TABLETS BY MOUTH EVERY MORNING AND 1 TALBET IN THE EVENING.   metoprolol tartrate 25 MG tablet Commonly known as:  LOPRESSOR TAKE ONE-HALF TABLET BY MOUTH TWICE DAILY   nitroGLYCERIN 0.4 MG SL tablet Commonly known as:  NITROSTAT Place 1 tablet (0.4 mg total) under the tongue every 5 (five) minutes as needed for chest pain.   polycarbophil 625 MG tablet Commonly known as:  FIBERCON Take 625 mg by mouth daily.   potassium chloride SA 20 MEQ tablet Commonly known as:  KLOR-CON M20 Take 2 tablets (40 mEq total) by mouth 2 (two) times daily.   pregabalin 75 MG capsule Commonly known as:  LYRICA Take 1 po q  am , 1 po midday and 2 po qhs   psyllium 58.6 % powder Commonly known as:  METAMUCIL Take 1 packet by mouth 3 (three) times daily.       Allergies:  Allergies  Allergen Reactions  . Benazepril Swelling    angioedema; he is not a candidate for any angiotensin receptor blockers because of this significant allergic reaction. Because of a history of documented adverse serious drug reaction;Medi Alert bracelet  is recommended  . Hctz [Hydrochlorothiazide] Anaphylaxis and Swelling    Tongue and lip swelling   . Aspirin Other (See Comments)    Gastritis, cant take 325 Mg aspirin   . Lactose Intolerance (Gi) Nausea And Vomiting    Past Medical History, Surgical history, Social history, and Family History were reviewed and updated.  Review of Systems: All other 10 point review of systems is negative.   Physical Exam:  weight is 247 lb (112 kg). His oral temperature is 97.8 F (36.6 C). His blood pressure is 130/64 and his pulse is 80. His respiration is 16 and oxygen saturation is 95%.   Wt Readings from Last 3 Encounters:  07/24/18 247 lb (112 kg)  07/13/18 252 lb 3.2 oz (114.4 kg)  05/12/18 249 lb 9.6 oz (113.2 kg)    Ocular: Sclerae unicteric, pupils equal, round and reactive to light Ear-nose-throat: Oropharynx clear, dentition fair Lymphatic: No cervical, supraclavicular or axillary adenopathy Lungs no rales or rhonchi, good excursion bilaterally Heart regular rate and rhythm, no murmur appreciated Abd soft, nontender, positive bowel sounds, no liver or spleen tip palpated on exam, no fluid wave  MSK no focal spinal tenderness, no joint edema Neuro: non-focal, well-oriented, appropriate affect Breasts: Deferred   Lab Results  Component Value Date   WBC 10.4 07/24/2018   HGB 12.3 (L) 07/24/2018   HCT 38.4 (L) 07/24/2018   MCV 93.9 07/24/2018   PLT 206 07/24/2018   Lab Results  Component Value Date   FERRITIN 1,067 (H) 05/01/2018   IRON 104 05/01/2018   TIBC 313  05/01/2018   UIBC 209 05/01/2018   IRONPCTSAT 33 05/01/2018   Lab Results  Component Value Date   RETICCTPCT 1.9 07/24/2018   RBC 4.09 (L) 07/24/2018   RBC 4.09 (L) 07/24/2018   RETICCTABS 52.1 11/22/2011   No results found for: KPAFRELGTCHN, LAMBDASER, KAPLAMBRATIO Lab Results  Component Value Date   IGA 159 03/09/2012   No results found for: Ronnald Ramp, A1GS, A2GS, Violet Baldy, MSPIKE, SPEI   Chemistry      Component Value Date/Time   NA 138 06/26/2018 1427   NA 137 08/01/2017 0805   NA 144 06/27/2017 0857   K 4.0 06/26/2018 1427   K 3.9 06/27/2017 0857   CL 102 06/26/2018 1427   CL  103 06/27/2017 0857   CO2 27 06/26/2018 1427   CO2 26 06/27/2017 0857   BUN 22 06/26/2018 1427   BUN 12 08/01/2017 0805   BUN 12 06/27/2017 0857   CREATININE 1.60 (H) 06/26/2018 1427   CREATININE 1.0 06/27/2017 0857      Component Value Date/Time   CALCIUM 9.4 06/26/2018 1427   CALCIUM 9.2 06/27/2017 0857   ALKPHOS 61 06/26/2018 1427   ALKPHOS 128 (H) 06/27/2017 0857   AST 15 06/26/2018 1427   ALT 11 06/26/2018 1427   ALT 24 06/27/2017 0857   BILITOT 0.6 06/26/2018 1427       Impression and Plan: Richard [name] is a very pleasant 78 yo caucasian gentleman with diffuse large B-cell lymphoma (not double hit lymphoma). He continues to do well and has no complaints at this time.  He received both B 12 and Xgeva today as planned.  We will plan to see him back in another 3 months.  He will contact our office with any questions or concerns. We can certainly see her sooner if need be.   Laverna Peace, NP 1/24/20202:53 PM

## 2018-07-24 NOTE — Patient Instructions (Signed)

## 2018-07-24 NOTE — Patient Instructions (Signed)
Denosumab injection What is this medicine? DENOSUMAB (den oh sue mab) slows bone breakdown. Prolia is used to treat osteoporosis in women after menopause and in men, and in people who are taking corticosteroids for 6 months or more. Xgeva is used to treat a high calcium level due to cancer and to prevent bone fractures and other bone problems caused by multiple myeloma or cancer bone metastases. Xgeva is also used to treat giant cell tumor of the bone. This medicine may be used for other purposes; ask your health care provider or pharmacist if you have questions. COMMON BRAND NAME(S): Prolia, XGEVA What should I tell my health care provider before I take this medicine? They need to know if you have any of these conditions: -dental disease -having surgery or tooth extraction -infection -kidney disease -low levels of calcium or Vitamin D in the blood -malnutrition -on hemodialysis -skin conditions or sensitivity -thyroid or parathyroid disease -an unusual reaction to denosumab, other medicines, foods, dyes, or preservatives -pregnant or trying to get pregnant -breast-feeding How should I use this medicine? This medicine is for injection under the skin. It is given by a health care professional in a hospital or clinic setting. A special MedGuide will be given to you before each treatment. Be sure to read this information carefully each time. For Prolia, talk to your pediatrician regarding the use of this medicine in children. Special care may be needed. For Xgeva, talk to your pediatrician regarding the use of this medicine in children. While this drug may be prescribed for children as young as 13 years for selected conditions, precautions do apply. Overdosage: If you think you have taken too much of this medicine contact a poison control center or emergency room at once. NOTE: This medicine is only for you. Do not share this medicine with others. What if I miss a dose? It is important not to  miss your dose. Call your doctor or health care professional if you are unable to keep an appointment. What may interact with this medicine? Do not take this medicine with any of the following medications: -other medicines containing denosumab This medicine may also interact with the following medications: -medicines that lower your chance of fighting infection -steroid medicines like prednisone or cortisone This list may not describe all possible interactions. Give your health care provider a list of all the medicines, herbs, non-prescription drugs, or dietary supplements you use. Also tell them if you smoke, drink alcohol, or use illegal drugs. Some items may interact with your medicine. What should I watch for while using this medicine? Visit your doctor or health care professional for regular checks on your progress. Your doctor or health care professional may order blood tests and other tests to see how you are doing. Call your doctor or health care professional for advice if you get a fever, chills or sore throat, or other symptoms of a cold or flu. Do not treat yourself. This drug may decrease your body's ability to fight infection. Try to avoid being around people who are sick. You should make sure you get enough calcium and vitamin D while you are taking this medicine, unless your doctor tells you not to. Discuss the foods you eat and the vitamins you take with your health care professional. See your dentist regularly. Brush and floss your teeth as directed. Before you have any dental work done, tell your dentist you are receiving this medicine. Do not become pregnant while taking this medicine or for 5 months   after stopping it. Talk with your doctor or health care professional about your birth control options while taking this medicine. Women should inform their doctor if they wish to become pregnant or think they might be pregnant. There is a potential for serious side effects to an unborn  child. Talk to your health care professional or pharmacist for more information. What side effects may I notice from receiving this medicine? Side effects that you should report to your doctor or health care professional as soon as possible: -allergic reactions like skin rash, itching or hives, swelling of the face, lips, or tongue -bone pain -breathing problems -dizziness -jaw pain, especially after dental work -redness, blistering, peeling of the skin -signs and symptoms of infection like fever or chills; cough; sore throat; pain or trouble passing urine -signs of low calcium like fast heartbeat, muscle cramps or muscle pain; pain, tingling, numbness in the hands or feet; seizures -unusual bleeding or bruising -unusually weak or tired Side effects that usually do not require medical attention (report to your doctor or health care professional if they continue or are bothersome): -constipation -diarrhea -headache -joint pain -loss of appetite -muscle pain -runny nose -tiredness -upset stomach This list may not describe all possible side effects. Call your doctor for medical advice about side effects. You may report side effects to FDA at 1-800-FDA-1088. Where should I keep my medicine? This medicine is only given in a clinic, doctor's office, or other health care setting and will not be stored at home. NOTE: This sheet is a summary. It may not cover all possible information. If you have questions about this medicine, talk to your doctor, pharmacist, or health care provider.  2019 Elsevier/Gold Standard (2017-10-24 16:10:44) Cyanocobalamin, Vitamin B12 injection What is this medicine? CYANOCOBALAMIN (sye an oh koe BAL a min) is a man made form of vitamin B12. Vitamin B12 is used in the growth of healthy blood cells, nerve cells, and proteins in the body. It also helps with the metabolism of fats and carbohydrates. This medicine is used to treat people who can not absorb vitamin  B12. This medicine may be used for other purposes; ask your health care provider or pharmacist if you have questions. COMMON BRAND NAME(S): B-12 Compliance Kit, B-12 Injection Kit, Cyomin, LA-12, Nutri-Twelve, Physicians EZ Use B-12, Primabalt What should I tell my health care provider before I take this medicine? They need to know if you have any of these conditions: -kidney disease -Leber's disease -megaloblastic anemia -an unusual or allergic reaction to cyanocobalamin, cobalt, other medicines, foods, dyes, or preservatives -pregnant or trying to get pregnant -breast-feeding How should I use this medicine? This medicine is injected into a muscle or deeply under the skin. It is usually given by a health care professional in a clinic or doctor's office. However, your doctor may teach you how to inject yourself. Follow all instructions. Talk to your pediatrician regarding the use of this medicine in children. Special care may be needed. Overdosage: If you think you have taken too much of this medicine contact a poison control center or emergency room at once. NOTE: This medicine is only for you. Do not share this medicine with others. What if I miss a dose? If you are given your dose at a clinic or doctor's office, call to reschedule your appointment. If you give your own injections and you miss a dose, take it as soon as you can. If it is almost time for your next dose, take only that dose.  Do not take double or extra doses. What may interact with this medicine? -colchicine -heavy alcohol intake This list may not describe all possible interactions. Give your health care provider a list of all the medicines, herbs, non-prescription drugs, or dietary supplements you use. Also tell them if you smoke, drink alcohol, or use illegal drugs. Some items may interact with your medicine. What should I watch for while using this medicine? Visit your doctor or health care professional regularly. You may  need blood work done while you are taking this medicine. You may need to follow a special diet. Talk to your doctor. Limit your alcohol intake and avoid smoking to get the best benefit. What side effects may I notice from receiving this medicine? Side effects that you should report to your doctor or health care professional as soon as possible: -allergic reactions like skin rash, itching or hives, swelling of the face, lips, or tongue -blue tint to skin -chest tightness, pain -difficulty breathing, wheezing -dizziness -red, swollen painful area on the leg Side effects that usually do not require medical attention (report to your doctor or health care professional if they continue or are bothersome): -diarrhea -headache This list may not describe all possible side effects. Call your doctor for medical advice about side effects. You may report side effects to FDA at 1-800-FDA-1088. Where should I keep my medicine? Keep out of the reach of children. Store at room temperature between 15 and 30 degrees C (59 and 85 degrees F). Protect from light. Throw away any unused medicine after the expiration date. NOTE: This sheet is a summary. It may not cover all possible information. If you have questions about this medicine, talk to your doctor, pharmacist, or health care provider.  2019 Elsevier/Gold Standard (2007-09-28 22:10:20)

## 2018-07-27 ENCOUNTER — Ambulatory Visit (INDEPENDENT_AMBULATORY_CARE_PROVIDER_SITE_OTHER): Payer: Medicare Other | Admitting: Neurology

## 2018-07-27 ENCOUNTER — Encounter: Payer: Self-pay | Admitting: Neurology

## 2018-07-27 VITALS — BP 130/72 | HR 80 | Ht 72.0 in | Wt 248.2 lb

## 2018-07-27 DIAGNOSIS — G6289 Other specified polyneuropathies: Secondary | ICD-10-CM | POA: Diagnosis not present

## 2018-07-27 DIAGNOSIS — G629 Polyneuropathy, unspecified: Secondary | ICD-10-CM | POA: Insufficient documentation

## 2018-07-27 LAB — IRON AND TIBC
Iron: 83 ug/dL (ref 42–163)
Saturation Ratios: 22 % (ref 20–55)
TIBC: 379 ug/dL (ref 202–409)
UIBC: 296 ug/dL (ref 117–376)

## 2018-07-27 LAB — FERRITIN: Ferritin: 594 ng/mL — ABNORMAL HIGH (ref 24–336)

## 2018-07-27 NOTE — Progress Notes (Signed)
PATIENT: Richard [name] DOB: 1941/06/04  Chief Complaint  Patient presents with  . Peripheral Neuropathy    He has diabetes and his A1C was 6.5 two months ago.  He is having more discomfort in his feet and legs.  His PCP just increased his Lyrica 75mg , one tab in am, 1 tab midday and 2 tabs at [facility].    [name] PCP    Carollee Herter, Alferd Apa, DO     HISTORICAL  Richard [name] is a 78 years old male, seen in request by his primary care physician Dr. Carollee Herter, Kendrick Fries for evaluation of peripheral neuropathy, initial evaluation was on July 27, 2018  I have reviewed and summarized the referring note from the referring physician.  He had past medical history of hypertension, hyperlipidemia, diabetes since age 1, diabetic peripheral neuropathy, he was also treated with chemotherapy for large B-cell lymphoma from August 2018 to February 2019, transcatheter aortic valve replacement using bioprosthesis for severe aortic stenosis in March 2019, paroxysmal atrial fibrillation on chronic Eliquis treatment, status post pacemaker  He reported a history of gradual onset bilateral feet paresthesia since 2009, initially at the bottom of his feet, numbness tingling, occasionally shooting pain, he denies significant worsening following his chemotherapy, but he does complain loss sense of smell and taste from his chemotherapy,  He also complains of chronic low back pain, especially on the right side, he is receiving orthopedic treatment, scheduled to have epidural injection,  Over the years, his feet paresthesia gradually getting worse, he was initially treated with low dose of Lyrica, recently the dosage was increased to 75/75/2 tablets of 75 every night, while he was active during the day, he denies significant difficulty, but at the evening and the end of the day, he felt numbness tingling stinging pain below ankle level, urge to move his leg, he does complains of mild side effect with Lyrica, "feeling high",  he also complains of mild bilateral lower extremity swelling  He denies bowel and bladder incontinence, denies upper extremity paresthesia,  Laboratory evaluations in January 2020, CBC, hemoglobin of 12.3, creatinine of 1.66, B12 of 159, he has been on B12 supplement, ferritin level was 594, LDL was 79   REVIEW OF SYSTEMS: Full 14 system review of systems performed and notable only for All other review of systems were negative.  ALLERGIES: Allergies  Allergen Reactions  . Benazepril Swelling    angioedema; he is not a candidate for any angiotensin receptor blockers because of this significant allergic reaction. Because of a history of documented adverse serious drug reaction;Medi Alert bracelet  is recommended  . Hctz [Hydrochlorothiazide] Anaphylaxis and Swelling    Tongue and lip swelling   . Aspirin Other (See Comments)    Gastritis, cant take 325 Mg aspirin   . Lactose Intolerance (Gi) Nausea And Vomiting    HOME MEDICATIONS: Current Outpatient Medications  Medication Sig Dispense Refill  . allopurinol (ZYLOPRIM) 300 MG tablet Take 450 mg by mouth daily.     [name] amLODipine (NORVASC) 5 MG tablet Take 1 tablet BID 180 tablet 3  . atorvastatin (LIPITOR) 80 MG tablet TAKE ONE TABLET BY MOUTH AT BEDTIME 90 tablet 3  . azelastine (ASTELIN) 0.1 % nasal spray Place 2 sprays into both nostrils at bedtime as needed for rhinitis. Use in each nostril as directed 30 mL 3  . ELIQUIS 5 MG TABS tablet TAKE 1 TABLET BY MOUTH TWICE A DAY 180 tablet 1  . esomeprazole (NEXIUM) 40 MG  capsule Take 1 capsule (40 mg total) by mouth daily. 30 capsule 5  . ezetimibe (ZETIA) 10 MG tablet TAKE 1 TABLET BY MOUTH EVERY DAY 90 tablet 3  . fenofibrate 160 MG tablet Take 1 tablet (160 mg total) by mouth daily. 30 tablet 3  . folic acid (FOLVITE) 1 MG tablet Take 2 tablets (2 mg total) by mouth daily. 60 tablet 12  . furosemide (LASIX) 40 MG tablet Take 3 tablets (120 mg total) by mouth 2 (two) times daily. 180  tablet 7  . glimepiride (AMARYL) 2 MG tablet TAKE 1 TABLET BY MOUTH DAILY WITH BREAKFAST. NEED OV/LAB WORK 90 tablet 0  . glucose blood (FREESTYLE TEST STRIPS) test strip TEST BLOOD SUGAR TWICE A DAY.  Due for follow up visit 100 each 0  . Lancets (FREESTYLE) lancets USE TWICE A DAY TO CHECK BLOOD SUGAR. DX E11.9 100 each 6  . metFORMIN (GLUCOPHAGE) 1000 MG tablet TAKE ONE AND ONE-HALF TABLETS BY MOUTH EVERY MORNING AND 1 TALBET IN THE EVENING. 225 tablet 1  . metoprolol tartrate (LOPRESSOR) 25 MG tablet TAKE ONE-HALF TABLET BY MOUTH TWICE DAILY 90 tablet 3  . nitroGLYCERIN (NITROSTAT) 0.4 MG SL tablet Place 1 tablet (0.4 mg total) under the tongue every 5 (five) minutes as needed for chest pain. 25 tablet 3  . polycarbophil (FIBERCON) 625 MG tablet Take 625 mg by mouth daily.    . potassium chloride SA (KLOR-CON M20) 20 MEQ tablet Take 2 tablets (40 mEq total) by mouth 2 (two) times daily. 120 tablet 7  . pregabalin (LYRICA) 75 MG capsule Take 1 po q am , 1 po midday and 2 po qhs 360 capsule 1  . psyllium (METAMUCIL) 58.6 % powder Take 1 packet by mouth 3 (three) times daily.     No current facility-administered medications for this visit.    Facility-Administered Medications Ordered in Other Visits  Medication Dose Route Frequency Provider Last Rate Last Dose  . sodium chloride flush (NS) 0.9 % injection 10 mL  10 mL Intravenous PRN [name], MD   10 mL at 05/30/17 0920    PAST MEDICAL HISTORY: Past Medical History:  Diagnosis Date  . Anemia   . Axillary adenopathy 02/25/2017  . Bradycardia    a. holter monitor has demonstrated HRs in 30s and [place]   . CAD (coronary artery disease)    a. s/p CABG 2001  b.  07/28/2017 cath:   Severe three-vessel native CAD with total occlusion of LAD, ramus intermedius, first OM and RCA, patent RIMA to PDA, LIMA to LAD, sequential SVG to ramus intermedius and first OM.    [name] Chronic lower back pain   . Diffuse large B cell lymphoma ([place])   .  Diverticulosis   . Esophageal stricture   . GERD (gastroesophageal reflux disease)   . History of gout   . HTN (hypertension)   . Mixed hyperlipidemia   . OSA on CPAP    with 2L O2 at night  . Pancytopenia ([place])    a. related to chemo therapy for B cell lymphoma  . Peptic stricture of esophagus   . Severe aortic stenosis   . Spinal stenosis   . Type II diabetes mellitus ([place])     PAST SURGICAL HISTORY: Past Surgical History:  Procedure Laterality Date  . APPENDECTOMY  ~ 1952  . BACK SURGERY    . CATARACT EXTRACTION W/PHACO Left 11/06/2016   Procedure: CATARACT EXTRACTION PHACO AND INTRAOCULAR LENS PLACEMENT (IOC);  Surgeon: Estill Cotta, MD;  Location: [facility];  Service: Ophthalmology;  Laterality: Left;  Lot # [id number] H [place]: 01:09.4 AP%:25.2 CDE: 30.64  . CATARACT EXTRACTION W/PHACO Right 12/04/2016   Procedure: CATARACT EXTRACTION PHACO AND INTRAOCULAR LENS PLACEMENT (IOC);  Surgeon: Estill Cotta, MD;  Location: [facility];  Service: Ophthalmology;  Laterality: Right;  [place] 1:25.9 AP% 24.1 CDE 39.10 Fluid Pack lot # [id number]  . COLONOSCOPY W/ BIOPSIES AND POLYPECTOMY  2013  . CORONARY ANGIOPLASTY  1993  . CORONARY ANGIOPLASTY WITH STENT PLACEMENT  05/1997   "1"  . CORONARY ARTERY BYPASS GRAFT  03/2000   "CABG X5"  . ESOPHAGEAL DILATION  X 3-4   Dr. Lyla Son; "last one was in the 1990's"  . ESOPHAGOGASTRODUODENOSCOPY     multiple  . FLEXIBLE SIGMOIDOSCOPY     multiple  . HYDRADENITIS EXCISION Left 02/25/2017   Procedure: EXCISION DEEP LEFT AXILLARY LYMPH NODE;  Surgeon: Fanny Skates, MD;  Location: [place];  Service: General;  Laterality: Left;  . INTRAOPERATIVE TRANSTHORACIC ECHOCARDIOGRAM N/A 09/09/2017   Procedure: INTRAOPERATIVE TRANSTHORACIC ECHOCARDIOGRAM;  Surgeon: Burnell Blanks, MD;  Location: [place];  Service: Open Heart Surgery;  Laterality: N/A;  . KNEE ARTHROSCOPY Left 2011   meniscus repair  . LEFT HEART CATHETERIZATION WITH CORONARY/GRAFT  ANGIOGRAM N/A 03/16/2014   Procedure: LEFT HEART CATHETERIZATION WITH Beatrix Fetters;  Surgeon: Burnell Blanks, MD;  Location: [facility] CATH LAB;  Service: Cardiovascular;  Laterality: N/A;  . LUMBAR LAMINECTOMY/DECOMPRESSION MICRODISCECTOMY Right 06/17/2013   Procedure: LUMBAR LAMINECTOMY MICRODISCECTOMY L4-L5 RIGHT EXCISION OF SYNOVIAL CYST RIGHT   (1 LEVEL) RIGHT PARTIAL FACETECTOMY;  Surgeon: Tobi Bastos, MD;  Location: [facility];  Service: Orthopedics;  Laterality: Right;  . MYELOGRAM  04/06/13   lumbar, Dr Gladstone Lighter  . PACEMAKER IMPLANT N/A 09/10/2017   Procedure: PACEMAKER IMPLANT;  Surgeon: Deboraha Sprang, MD;  Location: [place] CV LAB;  Service: Cardiovascular;  Laterality: N/A;  . PANENDOSCOPY    . PORTACATH PLACEMENT N/A 03/06/2017   Procedure: INSERTION PORT-A-CATH AND ASPIRATE SEROMA LEFT AXILLA;  Surgeon: Fanny Skates, MD;  Location: [place];  Service: General;  Laterality: N/A;  . RIGHT/LEFT HEART CATH AND CORONARY/GRAFT ANGIOGRAPHY N/A 07/28/2017   Procedure: RIGHT/LEFT HEART CATH AND CORONARY/GRAFT ANGIOGRAPHY;  Surgeon: Sherren Mocha, MD;  Location: [place] CV LAB;  Service: Cardiovascular;  Laterality: N/A;  . SHOULDER SURGERY Right 08/2010   screws placed; "tendons tore off"  . SKIN CANCER EXCISION Right    "neck"  . TONSILLECTOMY  ~ 1954  . TRANSCATHETER AORTIC VALVE REPLACEMENT, TRANSFEMORAL N/A 09/09/2017   Procedure: TRANSCATHETER AORTIC VALVE REPLACEMENT, TRANSFEMORAL;  Surgeon: Burnell Blanks, MD;  Location: [place];  Service: Open Heart Surgery;  Laterality: N/A;  . UPPER GI ENDOSCOPY  2013   Gastritis; Dr Carlean Purl  . VASECTOMY      FAMILY HISTORY: Family History  Problem Relation Age of Onset  . Stroke Father   . Hypertension Father   . Pancreatic cancer Mother   . Diabetes Maternal Grandmother   . Stroke Maternal Grandmother   . Heart attack Paternal Grandmother   . Colon cancer Neg Hx   . Esophageal cancer Neg Hx   . Rectal  cancer Neg Hx   . Stomach cancer Neg Hx   . Ulcers Neg Hx     SOCIAL HISTORY: Social History   Socioeconomic History  . Marital status: Divorced    Spouse name: Not on file  . Number of children: 2  .  Years of education: college  . Highest education level: Not on file  Occupational History    Employer: RETIRED  Social Needs  . Financial resource strain: Not on file  . Food insecurity:    Worry: Not on file    Inability: Not on file  . Transportation needs:    Medical: Not on file    Non-medical: Not on file  Tobacco Use  . Smoking status: Never Smoker  . Smokeless tobacco: Never Used  Substance and Sexual Activity  . Alcohol use: No    Alcohol/week: 0.0 standard drinks    Comment: "last drink was in 2012"( 03/15/2014)  . Drug use: No  . Sexual activity: Not Currently  Lifestyle  . Physical activity:    Days per week: Not on file    Minutes per session: Not on file  . Stress: Not on file  Relationships  . Social connections:    Talks on phone: Not on file    Gets together: Not on file    Attends religious service: Not on file    Active member of club or organization: Not on file    Attends meetings of clubs or organizations: Not on file    Relationship status: Not on file  . Intimate partner violence:    Fear of current or ex partner: Not on file    Emotionally abused: Not on file    Physically abused: Not on file    Forced sexual activity: Not on file  Other Topics Concern  . Not on file  Social History Narrative   Divorced, lives with a roommate.   1 son one daughter   3-4 caffeinated beverages daily   Right-handed.   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     PHYSICAL EXAM   Vitals:   07/27/18 1419  BP: 130/72  Pulse: 80  Weight: 248 lb 4 oz (112.6 kg)  Height: 6' (1.829 m)    Not recorded      Body mass index is 33.67  kg/m.  PHYSICAL EXAMNIATION:  Gen: NAD, conversant, well nourised, obese, well groomed                     Cardiovascular: Regular rate rhythm, no peripheral edema, warm, nontender. Eyes: Conjunctivae clear without exudates or hemorrhage Neck: Supple, no carotid bruits. Pulmonary: Clear to auscultation bilaterally   NEUROLOGICAL EXAM:  MENTAL STATUS: Speech:    Speech is normal; fluent and spontaneous with normal comprehension.  Cognition:     Orientation to time, place and person     Normal recent and remote memory     Normal Attention span and concentration     Normal Language, naming, repeating,spontaneous speech     Fund of knowledge   CRANIAL NERVES: CN II: Visual fields are full to confrontation. Pupils are round equal and briskly reactive to light. CN III, IV, VI: extraocular movement are normal. No ptosis. CN V: Facial sensation is intact to pinprick in all 3 divisions bilaterally. Corneal responses are intact.  CN VII: Face is symmetric with normal eye closure and smile. CN VIII: Hearing is normal to rubbing fingers CN IX, X: Palate elevates symmetrically. Phonation is normal. CN XI: Head turning and shoulder shrug are intact CN XII: Tongue is midline with normal movements and no atrophy.  MOTOR: He has discoloration from mid shin down, mild bilateral  toe extension flexion weakness,  REFLEXES: Reflexes are 1 and symmetric at the biceps, triceps, knees, and absent at ankles. Plantar responses are flexor.  SENSORY: Length dependent decreased to light touch, pinprick, and vibratory sensation to mid shin level  COORDINATION: Rapid alternating movements and fine finger movements are intact. There is no dysmetria on finger-to-nose and heel-knee-shin.    GAIT/STANCE: He needs pushed up to get up from seated position, mildly unsteady, mild positive Romberg signs,   DIAGNOSTIC DATA (LABS, IMAGING, TESTING) - I reviewed patient records, labs, notes, testing and  imaging myself where available.   ASSESSMENT AND PLAN  Richard [name] is a 78 y.o. male   Peripheral neuropathy  Likely due to combination of diabetes, B12 deficiency, chemotherapy in August 20 18-20 19,  Laboratory evaluations to rule out other treatable etiology  His symptoms is under excellent control with current Lyrica but complains of mildly side effect, I have advised him to skip morning dose,  EMG nerve conduction study Chronic right side low back pain, denies radiating pain  Not a candidate for MRI due to pacemaker placement  Continue management with orthopedic surgeon  Marcial Pacas, M.D. Ph.D.  [facility] [address], [place], [place] [address] Ph: [phone] Fax: [phone]  CC: Carollee Herter, Alferd Apa, DO

## 2018-07-28 LAB — PROTEIN ELECTROPHORESIS, SERUM
A/G Ratio: 1.5 (ref 0.7–1.7)
Albumin ELP: 4.2 g/dL (ref 2.9–4.4)
Alpha 1: 0.2 g/dL (ref 0.0–0.4)
Alpha 2: 1 g/dL (ref 0.4–1.0)
Beta: 1 g/dL (ref 0.7–1.3)
GLOBULIN, TOTAL: 2.8 g/dL (ref 2.2–3.9)
Gamma Globulin: 0.5 g/dL (ref 0.4–1.8)
Total Protein: 7 g/dL (ref 6.0–8.5)

## 2018-07-28 LAB — ANA W/REFLEX: Anti Nuclear Antibody(ANA): NEGATIVE

## 2018-07-28 LAB — RPR: RPR Ser Ql: NONREACTIVE

## 2018-07-28 LAB — HEMOGLOBIN A1C
Est. average glucose Bld gHb Est-mCnc: 128 mg/dL
Hgb A1c MFr Bld: 6.1 % — ABNORMAL HIGH (ref 4.8–5.6)

## 2018-07-28 LAB — HIV ANTIBODY (ROUTINE TESTING W REFLEX): HIV Screen 4th Generation wRfx: NONREACTIVE

## 2018-07-28 LAB — VITAMIN D 25 HYDROXY (VIT D DEFICIENCY, FRACTURES): Vit D, 25-Hydroxy: 16.1 ng/mL — ABNORMAL LOW (ref 30.0–100.0)

## 2018-07-28 LAB — TSH: TSH: 2.45 u[IU]/mL (ref 0.450–4.500)

## 2018-08-06 DIAGNOSIS — M5136 Other intervertebral disc degeneration, lumbar region: Secondary | ICD-10-CM | POA: Diagnosis not present

## 2018-08-07 ENCOUNTER — Other Ambulatory Visit: Payer: Self-pay | Admitting: Cardiology

## 2018-08-11 ENCOUNTER — Telehealth: Payer: Self-pay

## 2018-08-11 DIAGNOSIS — Z953 Presence of xenogenic heart valve: Secondary | ICD-10-CM

## 2018-08-11 NOTE — Telephone Encounter (Signed)
TAVR completed 09/09/2017. Scheduled patient for 1 year echo and subsequent OV with [name], PA on 3/18. She patient understands to arrive at 1230. He was grateful for call and agrees with treatment plan.

## 2018-08-16 ENCOUNTER — Other Ambulatory Visit: Payer: Self-pay | Admitting: Family Medicine

## 2018-08-18 ENCOUNTER — Other Ambulatory Visit (INDEPENDENT_AMBULATORY_CARE_PROVIDER_SITE_OTHER): Payer: Medicare Other

## 2018-08-18 DIAGNOSIS — E785 Hyperlipidemia, unspecified: Secondary | ICD-10-CM | POA: Diagnosis not present

## 2018-08-18 DIAGNOSIS — E1165 Type 2 diabetes mellitus with hyperglycemia: Secondary | ICD-10-CM

## 2018-08-18 DIAGNOSIS — E119 Type 2 diabetes mellitus without complications: Secondary | ICD-10-CM | POA: Diagnosis not present

## 2018-08-18 DIAGNOSIS — E1159 Type 2 diabetes mellitus with other circulatory complications: Secondary | ICD-10-CM | POA: Diagnosis not present

## 2018-08-18 DIAGNOSIS — IMO0002 Reserved for concepts with insufficient information to code with codable children: Secondary | ICD-10-CM

## 2018-08-18 DIAGNOSIS — E1151 Type 2 diabetes mellitus with diabetic peripheral angiopathy without gangrene: Secondary | ICD-10-CM | POA: Diagnosis not present

## 2018-08-18 LAB — COMPREHENSIVE METABOLIC PANEL
ALT: 16 U/L (ref 0–53)
AST: 15 U/L (ref 0–37)
Albumin: 4.2 g/dL (ref 3.5–5.2)
Alkaline Phosphatase: 59 U/L (ref 39–117)
BUN: 21 mg/dL (ref 6–23)
CO2: 28 mEq/L (ref 19–32)
Calcium: 9.3 mg/dL (ref 8.4–10.5)
Chloride: 99 mEq/L (ref 96–112)
Creatinine, Ser: 1.44 mg/dL (ref 0.40–1.50)
GFR: 47.19 mL/min — ABNORMAL LOW (ref >60.00)
Glucose, Bld: 174 mg/dL — ABNORMAL HIGH (ref 70–99)
Potassium: 4 mEq/L (ref 3.5–5.1)
SODIUM: 139 meq/L (ref 135–145)
TOTAL PROTEIN: 6.5 g/dL (ref 6.0–8.3)
Total Bilirubin: 0.5 mg/dL (ref 0.2–1.2)

## 2018-08-18 LAB — LIPID PANEL
CHOL/HDL RATIO: 5
Cholesterol: 159 mg/dL (ref 0–200)
HDL: 31.5 mg/dL — ABNORMAL LOW (ref >39.00)
Triglycerides: 524 mg/dL — ABNORMAL HIGH (ref 0.0–149.0)

## 2018-08-18 LAB — LDL CHOLESTEROL, DIRECT: Direct LDL: 79 mg/dL

## 2018-08-19 ENCOUNTER — Encounter: Payer: Self-pay | Admitting: *Deleted

## 2018-08-19 ENCOUNTER — Other Ambulatory Visit: Payer: Self-pay

## 2018-08-19 ENCOUNTER — Telehealth: Payer: Self-pay | Admitting: Cardiology

## 2018-08-19 NOTE — Telephone Encounter (Signed)
New Message  Richard [name] at [facility] care is also Faxing the information       [facility] Pre-operative Risk Assessment    Request for surgical clearance:  1. What type of surgery is being performed? Lesions removed from eye lid and an ectrotion repair  2. When is this surgery scheduled? 09/01/2018  3. What type of clearance is required (medical clearance vs. Pharmacy clearance to hold med vs. Both)? Medical   4. Are there any medications that need to be held prior to surgery and how long? Eliquis 4 days before   5. Practice name and name of physician performing surgery? [facility], Norlene Duel MD  6. What is your office phone number? [phone]   7.   What is your office fax number? [phone]   8.   Anesthesia type (None, local, MAC, general) ? MAC     Richard [name] 08/19/2018, 4:36 PM  _________________________________________________________________   (provider comments below)

## 2018-08-20 ENCOUNTER — Telehealth: Payer: Self-pay | Admitting: *Deleted

## 2018-08-20 NOTE — Telephone Encounter (Signed)
Duplicate encounter. Clearance has been addressed and was faxed today. See initial telephone encounter. Will remove from pool.

## 2018-08-20 NOTE — Telephone Encounter (Signed)
   [facility] Pre-operative Risk Assessment    Request for surgical clearance:  1. What type of surgery is being performed? ORBITOTOMY W/O BONE FLAP W/REMOVAL OF LESION. ECTROPION REPAIR, EXTENSIVE   2. When is this surgery scheduled? 09/01/18   3. What type of clearance is required (medical clearance vs. Pharmacy clearance to hold med vs. Both)? BOTH  4. Are there any medications that need to be held prior to surgery and how long?ELIQUIS X 4 DAYS PRIOR   5. Practice name and name of physician performing surgery? [facility].; DR. AMY FOWLER   6. What is your office phone number [phone]    7.   What is your office fax number [phone]  8.   Anesthesia type (None, local, MAC, general) ? MAC   Julaine Hua 08/20/2018, 12:44 PM  _________________________________________________________________   (provider comments below)

## 2018-08-20 NOTE — Telephone Encounter (Signed)
   Primary Cardiologist: Minus Breeding, MD  Chart reviewed as part of pre-operative protocol coverage. Patient was contacted 08/20/2018 in reference to pre-operative risk assessment for pending surgery as outlined below.  Richard [name] was last seen on 01/20/18 by Dr. Percival Spanish.  Since that day, Richard [name] has done well w/o any anginal symptoms. No CP or dyspnea. He is having a low risk procedure.   Therefore, based on ACC/AHA guidelines, the patient would be at acceptable risk for the planned procedure without further cardiovascular testing.   Per pharmacy recommendations, Recommend holding Eliquis for 2-3 days prior to procedure rather than 4 days. This will still ensure proper washout period.  I will route this recommendation to the requesting party via Epic fax function and remove from pre-op pool.  Please call with questions.  Richard Zambito, PA-C 08/20/2018, 9:30 AM

## 2018-08-20 NOTE — Telephone Encounter (Signed)
Follow up   Richard [name] with [facility] is calling in reference to pre-op clearance. She states that most things were addressed except one thing. She wants to know will the patient need and antibiotic. Please advise,.

## 2018-08-20 NOTE — Telephone Encounter (Signed)
SBE prophylaxis w/ antibiotics only needed for dental procedures.

## 2018-08-20 NOTE — Telephone Encounter (Signed)
Pt takes Eliquis for afib with CHADS2VASc score of 6 (age x2, CHF, HTN, DM, CAD). CrCl is 71mL/min. Recommend holding Eliquis for 2-3 days prior to procedure rather than 4 days. This will still ensure proper washout period.

## 2018-08-21 ENCOUNTER — Other Ambulatory Visit: Payer: Self-pay | Admitting: *Deleted

## 2018-08-21 ENCOUNTER — Other Ambulatory Visit: Payer: Self-pay | Admitting: Family Medicine

## 2018-08-21 ENCOUNTER — Encounter: Payer: Self-pay | Admitting: Family Medicine

## 2018-08-21 ENCOUNTER — Ambulatory Visit (INDEPENDENT_AMBULATORY_CARE_PROVIDER_SITE_OTHER): Payer: Medicare Other | Admitting: Family Medicine

## 2018-08-21 VITALS — BP 134/70 | HR 70 | Temp 97.7°F | Ht 72.0 in | Wt 255.0 lb

## 2018-08-21 DIAGNOSIS — J0141 Acute recurrent pansinusitis: Secondary | ICD-10-CM

## 2018-08-21 DIAGNOSIS — E781 Pure hyperglyceridemia: Secondary | ICD-10-CM

## 2018-08-21 DIAGNOSIS — IMO0002 Reserved for concepts with insufficient information to code with codable children: Secondary | ICD-10-CM

## 2018-08-21 DIAGNOSIS — I10 Essential (primary) hypertension: Secondary | ICD-10-CM

## 2018-08-21 DIAGNOSIS — E1165 Type 2 diabetes mellitus with hyperglycemia: Principal | ICD-10-CM

## 2018-08-21 DIAGNOSIS — E785 Hyperlipidemia, unspecified: Secondary | ICD-10-CM

## 2018-08-21 DIAGNOSIS — E1151 Type 2 diabetes mellitus with diabetic peripheral angiopathy without gangrene: Secondary | ICD-10-CM

## 2018-08-21 MED ORDER — AMOXICILLIN-POT CLAVULANATE 875-125 MG PO TABS: 1.0000 | ORAL_TABLET | Freq: Two times a day (BID) | ORAL | 0 refills | Status: DC

## 2018-08-21 MED ORDER — ICOSAPENT ETHYL 1 G PO CAPS: ORAL_CAPSULE | ORAL | 3 refills | Status: DC

## 2018-08-21 NOTE — Progress Notes (Signed)
Patient ID: Richard [name], male    DOB: February 11, 1941  Age: 78 y.o. MRN: [id number]    Subjective:  Subjective  HPI Richard [name] presents for sinus pressure/ congestion x 1 week.  He is to have eye surgery next week.  No fevers.  No sore throat   He is taking otc meds only  Review of Systems  Constitutional: Negative for chills and fever.  HENT: Positive for congestion, postnasal drip, rhinorrhea and sinus pressure.   Respiratory: Negative for cough, chest tightness, shortness of breath and wheezing.   Cardiovascular: Negative for chest pain, palpitations and leg swelling.  Allergic/Immunologic: Negative for environmental allergies.    History Past Medical History:  Diagnosis Date  . Anemia   . Arthritis    hips  . Axillary adenopathy 02/25/2017  . Bradycardia    a. holter monitor has demonstrated HRs in 30s and [place]   . CAD (coronary artery disease)    a. s/p CABG 2001  b.  07/28/2017 cath:   Severe three-vessel native CAD with total occlusion of LAD, ramus intermedius, first OM and RCA, patent RIMA to PDA, LIMA to LAD, sequential SVG to ramus intermedius and first OM.    [name] Chronic lower back pain   . Diffuse large B cell lymphoma ([place])   . Diverticulosis   . Esophageal stricture   . GERD (gastroesophageal reflux disease)   . History of gout   . HTN (hypertension)   . Mixed hyperlipidemia   . OSA on CPAP    with 2L O2 at night  . Pancytopenia ([place])    a. related to chemo therapy for B cell lymphoma  . Peptic stricture of esophagus   . Severe aortic stenosis   . Spinal stenosis   . Type II diabetes mellitus ([place])   . Wears dentures    partial upper    He has a past surgical history that includes Vasectomy; Coronary artery bypass graft (03/2000); Esophageal dilation (X 3-4); Esophagogastroduodenoscopy; Flexible sigmoidoscopy; Panendoscopy; Knee arthroscopy (Left, 2011); Shoulder surgery (Right, 08/2010); Colonoscopy w/ biopsies and polypectomy (2013); Upper gi endoscopy  (2013); Myelogram (04/06/13); Lumbar laminectomy/decompression microdiscectomy (Right, 06/17/2013); Back surgery; Appendectomy (~ 1952); Tonsillectomy (~ 1954); Coronary angioplasty (1993); Coronary angioplasty with stent (05/1997); Skin cancer excision (Right); left heart catheterization with coronary/graft angiogram (N/A, 03/16/2014); Cataract extraction w/PHACO (Left, 11/06/2016); Cataract extraction w/PHACO (Right, 12/04/2016); Hydradenitis excision (Left, 02/25/2017); Portacath placement (N/A, 03/06/2017); RIGHT/LEFT HEART CATH AND CORONARY/GRAFT ANGIOGRAPHY (N/A, 07/28/2017); Transcatheter aortic valve replacement, transfemoral (N/A, 09/09/2017); Intraoperative transthoracic echocardiogram (N/A, 09/09/2017); and PACEMAKER IMPLANT (N/A, 09/10/2017).   His family history includes Diabetes in his maternal grandmother; Heart attack in his paternal grandmother; Hypertension in his father; Pancreatic cancer in his mother; Stroke in his father and maternal grandmother.He reports that he has never smoked. He has never used smokeless tobacco. He reports that he does not drink alcohol or use drugs.  Current Outpatient Medications on File Prior to Visit  Medication Sig Dispense Refill  . allopurinol (ZYLOPRIM) 300 MG tablet Take 450 mg by mouth daily.     [name] amLODipine (NORVASC) 5 MG tablet Take 1 tablet BID 180 tablet 3  . atorvastatin (LIPITOR) 80 MG tablet TAKE ONE TABLET BY MOUTH AT BEDTIME 90 tablet 3  . azelastine (ASTELIN) 0.1 % nasal spray Place 2 sprays into both nostrils at bedtime as needed for rhinitis. Use in each nostril as directed 30 mL 3  . ELIQUIS 5 MG TABS tablet TAKE 1 TABLET BY MOUTH TWICE  A DAY 180 tablet 1  . esomeprazole (NEXIUM) 40 MG capsule Take 1 capsule (40 mg total) by mouth daily. 30 capsule 5  . ezetimibe (ZETIA) 10 MG tablet TAKE 1 TABLET BY MOUTH EVERY DAY 90 tablet 3  . fenofibrate 160 MG tablet Take 1 tablet (160 mg total) by mouth daily. 30 tablet 3  . folic acid (FOLVITE) 1 MG tablet  Take 2 tablets (2 mg total) by mouth daily. 60 tablet 12  . furosemide (LASIX) 40 MG tablet Take 3 tablets (120 mg total) by mouth 2 (two) times daily. 180 tablet 7  . glimepiride (AMARYL) 2 MG tablet TAKE 1 TABLET BY MOUTH DAILY WITH BREAKFAST. NEED OV/LAB WORK 90 tablet 0  . glucose blood (FREESTYLE TEST STRIPS) test strip TEST BLOOD SUGAR TWICE A DAY.  Due for follow up visit 100 each 0  . Lancets (FREESTYLE) lancets USE TWICE A DAY TO CHECK BLOOD SUGAR. DX E11.9 100 each 6  . metFORMIN (GLUCOPHAGE) 1000 MG tablet TAKE 1 & 1/2 TABLETS BY MOUTH EVERY MORNING AND 1 TABLET IN THE EVENING. 225 tablet 1  . metoprolol tartrate (LOPRESSOR) 25 MG tablet TAKE ONE-HALF TABLET BY MOUTH TWICE DAILY 90 tablet 3  . nitroGLYCERIN (NITROSTAT) 0.4 MG SL tablet PLACE 1 TABLET (0.4 MG TOTAL) UNDER THE TONGUE EVERY 5 (FIVE) MINUTES AS NEEDED FOR CHEST PAIN. 25 tablet 3  . polycarbophil (FIBERCON) 625 MG tablet Take 625 mg by mouth daily.    . potassium chloride SA (KLOR-CON M20) 20 MEQ tablet Take 2 tablets (40 mEq total) by mouth 2 (two) times daily. 120 tablet 7  . pregabalin (LYRICA) 75 MG capsule Take 1 po q am , 1 po midday and 2 po qhs 360 capsule 1  . psyllium (METAMUCIL) 58.6 % powder Take 1 packet by mouth 3 (three) times daily.     Current Facility-Administered Medications on File Prior to Visit  Medication Dose Route Frequency Provider Last Rate Last Dose  . sodium chloride flush (NS) 0.9 % injection 10 mL  10 mL Intravenous PRN [name], MD   10 mL at 05/30/17 0920     Objective:  Objective  Physical Exam Vitals signs and nursing note reviewed.  Constitutional:      Appearance: He is well-developed.  HENT:     Right Ear: External ear normal.     Left Ear: External ear normal.     Nose:     Right Sinus: Maxillary sinus tenderness and frontal sinus tenderness present.     Left Sinus: Maxillary sinus tenderness and frontal sinus tenderness present.  Eyes:     General:        Right  eye: No discharge.        Left eye: No discharge.     Conjunctiva/sclera: Conjunctivae normal.  Cardiovascular:     Rate and Rhythm: Normal rate and regular rhythm.     Heart sounds: Normal heart sounds. No murmur.  Pulmonary:     Effort: Pulmonary effort is normal. No respiratory distress.     Breath sounds: Normal breath sounds. No wheezing or rales.  Chest:     Chest wall: No tenderness.  Lymphadenopathy:     Cervical: Cervical adenopathy present.  Neurological:     Mental Status: He is alert and oriented to person, place, and time.    BP 134/70   Pulse 70   Temp 97.7 F (36.5 C)   Ht 6' (1.829 m)   Wt 255 lb (115.7  kg)   SpO2 94%   BMI 34.58 kg/m  Wt Readings from Last 3 Encounters:  08/21/18 255 lb (115.7 kg)  07/27/18 248 lb 4 oz (112.6 kg)  07/24/18 247 lb (112 kg)     Lab Results  Component Value Date   WBC 10.4 07/24/2018   HGB 12.3 (L) 07/24/2018   HCT 38.4 (L) 07/24/2018   PLT 206 07/24/2018   GLUCOSE 174 (H) 08/18/2018   CHOL 159 08/18/2018   TRIG (H) 08/18/2018    524.0 Triglyceride is over 400; calculations on Lipids are invalid.   HDL 31.50 (L) 08/18/2018   LDLDIRECT 79.0 08/18/2018   LDLCALC 57 11/21/2016   ALT 16 08/18/2018   AST 15 08/18/2018   NA 139 08/18/2018   K 4.0 08/18/2018   CL 99 08/18/2018   CREATININE 1.44 08/18/2018   BUN 21 08/18/2018   CO2 28 08/18/2018   TSH 2.450 07/27/2018   PSA 0.65 09/27/2013   INR 1.15 11/15/2017   HGBA1C 6.1 (H) 07/27/2018   MICROALBUR 0.4 03/15/2016    Nm Pet Image Restag (ps) Skull Base To Thigh  Result Date: 02/24/2018 CLINICAL DATA:  Subsequent treatment strategy for large B-cell lymphoma. EXAM: NUCLEAR MEDICINE PET SKULL BASE TO THIGH TECHNIQUE: 12.04 mCi F-18 FDG was injected intravenously. Full-ring PET imaging was performed from the skull base to thigh after the radiotracer. CT data was obtained and used for attenuation correction and anatomic localization. Fasting blood glucose: 158 mg/dl  COMPARISON:  Multiple prior PET CTs.  The most recent is 09/01/2017 FINDINGS: Mediastinal blood pool activity: SUV max 2.54 NECK: No findings for recurrent neck adenopathy. Incidental CT findings: none CHEST: No residual or recurrent supraclavicular, axillary, subpectoral, mediastinal or hilar lymphadenopathy. Small subcutaneous cystic lesion noted superficial to surgical clips in the left axilla. On the prior study this was a simple appearing cyst without hypermetabolism. It is now much smaller but demonstrates mild hypermetabolism. I would wonder if this is infected or has been previously aspirated. Recommend clinical correlation. No worrisome pulmonary lesions. Incidental CT findings: Stable advanced atherosclerotic calcifications, tortuosity and ectasia of the thoracic aorta and stable cardiac enlargement, aortic valve replacement and extensive coronary artery calcifications. ABDOMEN/PELVIS: No abnormal hypermetabolic activity within the liver, pancreas, adrenal glands, or spleen. No hypermetabolic lymph nodes in the abdomen or pelvis. Incidental CT findings: Stable advanced atherosclerotic calcifications involving the abdominal aorta and iliac arteries. SKELETON: No focal hypermetabolic activity to suggest skeletal metastasis. Incidental CT findings: none IMPRESSION: 1. No PET-CT findings to suggest recurrent lymphoma in the neck, chest, abdomen or pelvis. 2. Subcutaneous lesion in the left axilla shows mild FDG uptake where previously was negative. It is also smaller and appears more complex. I would wonder if this is infected or has been recently aspirated. Recommend clinical correlation. Electronically Signed   By: Marijo Sanes M.D.   On: 02/24/2018 13:22     Assessment & Plan:  Plan  I am having Jefferey L. Riffe start on amoxicillin-clavulanate. I am also having him maintain his allopurinol, esomeprazole, polycarbophil, psyllium, ezetimibe, amLODipine, folic acid, atorvastatin, metoprolol tartrate,  ELIQUIS, azelastine, glucose blood, pregabalin, glimepiride, fenofibrate, freestyle, furosemide, potassium chloride SA, nitroGLYCERIN, and metFORMIN.  Meds ordered this encounter  Medications  . amoxicillin-clavulanate (AUGMENTIN) 875-125 MG tablet    Sig: Take 1 tablet by mouth 2 (two) times daily.    Dispense:  20 tablet    Refill:  0    Problem List Items Addressed This Visit  Unprioritized   Pansinusitis - Primary   Relevant Medications   amoxicillin-clavulanate (AUGMENTIN) 875-125 MG tablet    con't nasal steroid and antihistamine  rto prn  Pt may need ent referral  Follow-up: Return in about 6 months (around 02/19/2019), or if symptoms worsen or fail to improve.  [name], DO

## 2018-08-21 NOTE — Patient Instructions (Signed)
Sinusitis, Adult  Sinusitis is inflammation of your sinuses. Sinuses are hollow spaces in the bones around your face. Your sinuses are located:   Around your eyes.   In the middle of your forehead.   Behind your nose.   In your cheekbones.  Mucus normally drains out of your sinuses. When your nasal tissues become inflamed or swollen, mucus can become trapped or blocked. This allows bacteria, viruses, and fungi to grow, which leads to infection. Most infections of the sinuses are caused by a virus.  Sinusitis can develop quickly. It can last for up to 4 weeks (acute) or for more than 12 weeks (chronic). Sinusitis often develops after a cold.  What are the causes?  This condition is caused by anything that creates swelling in the sinuses or stops mucus from draining. This includes:   Allergies.   Asthma.   Infection from bacteria or viruses.   Deformities or blockages in your nose or sinuses.   Abnormal growths in the nose (nasal polyps).   Pollutants, such as chemicals or irritants in the air.   Infection from fungi (rare).  What increases the risk?  You are more likely to develop this condition if you:   Have a weak body defense system (immune system).   Do a lot of swimming or diving.   Overuse nasal sprays.   Smoke.  What are the signs or symptoms?  The main symptoms of this condition are pain and a feeling of pressure around the affected sinuses. Other symptoms include:   Stuffy nose or congestion.   Thick drainage from your nose.   Swelling and warmth over the affected sinuses.   Headache.   Upper toothache.   A cough that may get worse at night.   Extra mucus that collects in the throat or the back of the nose (postnasal drip).   Decreased sense of smell and taste.   Fatigue.   A fever.   Sore throat.   Bad breath.  How is this diagnosed?  This condition is diagnosed based on:   Your symptoms.   Your medical history.   A physical exam.   Tests to find out if your condition is  acute or chronic. This may include:  ? Checking your nose for nasal polyps.  ? Viewing your sinuses using a device that has a light (endoscope).  ? Testing for allergies or bacteria.  ? Imaging tests, such as an MRI or CT scan.  In rare cases, a bone biopsy may be done to rule out more serious types of fungal sinus disease.  How is this treated?  Treatment for sinusitis depends on the cause and whether your condition is chronic or acute.   If caused by a virus, your symptoms should go away on their own within 10 days. You may be given medicines to relieve symptoms. They include:  ? Medicines that shrink swollen nasal passages (topical intranasal decongestants).  ? Medicines that treat allergies (antihistamines).  ? A spray that eases inflammation of the nostrils (topical intranasal corticosteroids).  ? Rinses that help get rid of thick mucus in your nose (nasal saline washes).   If caused by bacteria, your health care provider may recommend waiting to see if your symptoms improve. Most bacterial infections will get better without antibiotic medicine. You may be given antibiotics if you have:  ? A severe infection.  ? A weak immune system.   If caused by narrow nasal passages or nasal polyps, you may need   to have surgery.  Follow these instructions at home:  Medicines   Take, use, or apply over-the-counter and prescription medicines only as told by your health care provider. These may include nasal sprays.   If you were prescribed an antibiotic medicine, take it as told by your health care provider. Do not stop taking the antibiotic even if you start to feel better.  Hydrate and humidify     Drink enough fluid to keep your urine pale yellow. Staying hydrated will help to thin your mucus.   Use a cool mist humidifier to keep the humidity level in your home above 50%.   Inhale steam for 10-15 minutes, 3-4 times a day, or as told by your health care provider. You can do this in the bathroom while a hot shower is  running.   Limit your exposure to cool or dry air.  Rest   Rest as much as possible.   Sleep with your head raised (elevated).   Make sure you get enough sleep each night.  General instructions     Apply a warm, moist washcloth to your face 3-4 times a day or as told by your health care provider. This will help with discomfort.   Wash your hands often with soap and water to reduce your exposure to germs. If soap and water are not available, use hand sanitizer.   Do not smoke. Avoid being around people who are smoking (secondhand smoke).   Keep all follow-up visits as told by your health care provider. This is important.  Contact a health care provider if:   You have a fever.   Your symptoms get worse.   Your symptoms do not improve within 10 days.  Get help right away if:   You have a severe headache.   You have persistent vomiting.   You have severe pain or swelling around your face or eyes.   You have vision problems.   You develop confusion.   Your neck is stiff.   You have trouble breathing.  Summary   Sinusitis is soreness and inflammation of your sinuses. Sinuses are hollow spaces in the bones around your face.   This condition is caused by nasal tissues that become inflamed or swollen. The swelling traps or blocks the flow of mucus. This allows bacteria, viruses, and fungi to grow, which leads to infection.   If you were prescribed an antibiotic medicine, take it as told by your health care provider. Do not stop taking the antibiotic even if you start to feel better.   Keep all follow-up visits as told by your health care provider. This is important.  This information is not intended to replace advice given to you by your health care provider. Make sure you discuss any questions you have with your health care provider.  Document Released: 06/17/2005 Document Revised: 11/17/2017 Document Reviewed: 11/17/2017  [employer] Interactive Patient Education  2019 [employer]

## 2018-08-26 NOTE — Discharge Instructions (Signed)
INSTRUCTIONS FOLLOWING OCULOPLASTIC SURGERY AMY Dennie Maizes, MD  AFTER YOUR EYE SURGERY, THER ARE MANY THINGS Comfort YOU, THE PATIENT, CAN DO TO ASSURE THE BEST POSSIBLE RESULT FROM YOUR OPERATION.  THIS SHEET SHOULD BE REFERRED TO WHENEVER QUESTIONS ARISE.  IF THERE ARE ANY QUESTIONS NOT ANSWERED HERE, DO NOT HESITATE TO CALL OUR OFFICE AT [phone] OR [phone].  THERE IS ALWAYS [facility] AVAILABLE TO CALL IF QUESTIONS OR PROBLEMS ARISE.  VISION: Your vision may be blurred and out of focus after surgery until you are able to stop using your ointment, swelling resolves and your eye(s) heal. This may take 1 to 2 weeks at the least.  If your vision becomes gradually more dim or dark, this is not normal and you need to call our office immediately.  EYE CARE: For the first 48 hours after surgery, use ice packs frequently - 20 minutes on, 20 minutes off - to help reduce swelling and bruising.  Small bags of frozen peas or corn make good ice packs along with cloths soaked in ice water.  If you are wearing a patch or other type of dressing following surgery, keep this on for the amount of time specified by your doctor.  For the first week following surgery, you will need to treat your stitches with great care.  If is OK to shower, but take care to not allow soapy water to run into your eye(s) to help reduce changes of infection.  You may gently clean the eyelashes and around the eye(s) with cotton balls and sterile water, BUT DO NOT RUB THE STITCHES VIGOROUSLY.  Keeping your stitches moist with ointment will help promote healing with minimal scar formation.  ACTIVITY: When you leave the surgery center, you should go home, rest and be inactive.  The eye(s) may feel scratchy and keeping the eyes closed will allow for faster healing.  The first week following surgery, avoid straining (anything making the face turn red) or lifting over 20 pounds.  Additionally, avoid bending which causes your head to go below  your waist.  Using your eyes will NOT harm them, so feel free to read, watch television, use the computer, etc as desired.  Driving depends on each individual, so check with your doctor if you have questions about driving.  MEDICATIONS:  You will be given a prescription for an ointment to use 4 times a day on your stitches.  You can use the ointment in your eyes if they feel scratchy or irritated.  If you eyelid(s) dont close completely when you sleep, put some ointment in your eyes before bedtime.  EMERGENCY: If you experience SEVERE EYE PAIN OR HEADACHE UNRELIEVED BY TYLENOL OR PERCOCET, NAUSEA OR VOMITING, WORSENING REDNESS, OR WORSENING VISION (ESPECIALLY VISION THAT WA INITIALLY BETTER) CALL [phone] OR [phone] DURING BUSINESS HOURS OR AFTER HOURS.  General Anesthesia, Adult, Care After This sheet gives you information about how to care for yourself after your procedure. Your health care provider may also give you more specific instructions. If you have problems or questions, contact your health care provider. What can I expect after the procedure? After the procedure, the following side effects are common:  Pain or discomfort at the IV site.  Nausea.  Vomiting.  Sore throat.  Trouble concentrating.  Feeling cold or chills.  Weak or tired.  Sleepiness and fatigue.  Soreness and body aches. These side effects can affect parts of the body that were not involved in surgery. Follow these instructions at home:  For at least 24 hours after the procedure:  Have a responsible adult stay with you. It is important to have someone help care for you until you are awake and alert.  Rest as needed.  Do not: ? Participate in activities in which you could fall or become injured. ? Drive. ? Use heavy machinery. ? Drink alcohol. ? Take sleeping pills or medicines that cause drowsiness. ? Make important decisions or sign legal documents. ? Take care of children on your  own. Eating and drinking  Follow any instructions from your health care provider about eating or drinking restrictions.  When you feel hungry, start by eating small amounts of foods that are soft and easy to digest (bland), such as toast. Gradually return to your regular diet.  Drink enough fluid to keep your urine pale yellow.  If you vomit, rehydrate by drinking water, juice, or clear broth. General instructions  If you have sleep apnea, surgery and certain medicines can increase your risk for breathing problems. Follow instructions from your health care provider about wearing your sleep device: ? Anytime you are sleeping, including during daytime naps. ? While taking prescription pain medicines, sleeping medicines, or medicines that make you drowsy.  Return to your normal activities as told by your health care provider. Ask your health care provider what activities are safe for you.  Take over-the-counter and prescription medicines only as told by your health care provider.  If you smoke, do not smoke without supervision.  Keep all follow-up visits as told by your health care provider. This is important. Contact a health care provider if:  You have nausea or vomiting that does not get better with medicine.  You cannot eat or drink without vomiting.  You have pain that does not get better with medicine.  You are unable to pass urine.  You develop a skin rash.  You have a fever.  You have redness around your IV site that gets worse. Get help right away if:  You have difficulty breathing.  You have chest pain.  You have blood in your urine or stool, or you vomit blood. Summary  After the procedure, it is common to have a sore throat or nausea. It is also common to feel tired.  Have a responsible adult stay with you for the first 24 hours after general anesthesia. It is important to have someone help care for you until you are awake and alert.  When you feel hungry,  start by eating small amounts of foods that are soft and easy to digest (bland), such as toast. Gradually return to your regular diet.  Drink enough fluid to keep your urine pale yellow.  Return to your normal activities as told by your health care provider. Ask your health care provider what activities are safe for you. This information is not intended to replace advice given to you by your health care provider. Make sure you discuss any questions you have with your health care provider. Document Released: 09/23/2000 Document Revised: 01/31/2017 Document Reviewed: 01/31/2017 Elsevier Interactive Patient Education  2019 [employer].

## 2018-08-28 ENCOUNTER — Encounter (INDEPENDENT_AMBULATORY_CARE_PROVIDER_SITE_OTHER): Payer: Medicare Other

## 2018-08-28 ENCOUNTER — Ambulatory Visit (INDEPENDENT_AMBULATORY_CARE_PROVIDER_SITE_OTHER): Payer: Medicare Other | Admitting: Neurology

## 2018-08-28 DIAGNOSIS — Z0289 Encounter for other administrative examinations: Secondary | ICD-10-CM

## 2018-08-28 DIAGNOSIS — G6289 Other specified polyneuropathies: Secondary | ICD-10-CM

## 2018-08-28 NOTE — Procedures (Signed)
Full Name: Richard [name] Gender: Male MRN #: [id number] Date of Birth: [birth year]    Visit Date: 08/28/2018 09:50 Age: 78 Years 1 Months Old History: 78 years old male with long history of diabetes, presented with bilateral lower extremity paresthesia.  Summary of the tests: Nerve conduction study: Left sural, bilateral superficial peroneal, left ulnar sensory responses were absent. Left peroneal to EDB, tibial motor responses were absent. Left ulnar motor responses were within normal limit.  Electromyography: Selected needle examination was performed at left lower extremity, left upper extremity muscles.  There was no significant abnormality noted. The needle examination of lumbar and cervical paraspinal muscle was not performed due to the fact that patient is taking anticoagulation.  Conclusion: This is an abnormal study.  There is electrodiagnostic evidence of length dependent moderate sensorimotor polyneuropathy.  There is no evidence of left cervical, left lumbosacral radiculopathy.    ------------------------------- Marcial Pacas, M.D.  [facility] [place], [place] [address] Tel: [phone] Fax: [phone]        [facility]    Nerve / Sites Muscle Latency Ref. Amplitude Ref. Rel Amp Segments Distance Velocity Ref. Area    ms ms mV mV %  cm m/s m/s mVms  L Ulnar - ADM     Wrist ADM 3.4 ?3.3 11.1 ?6.0 100 Wrist - ADM 7   31.3     B.Elbow ADM 7.2  9.6  86.9 B.Elbow - Wrist 22 57 ?49 29.9     A.Elbow ADM 10.2  9.8  101 A.Elbow - B.Elbow 13 45 ?49 32.0         A.Elbow - Wrist      L Peroneal - EDB     Ankle EDB NR ?6.5 NR ?2.0 NR Ankle - EDB 9   NR         Pop fossa - Ankle      L Tibial - AH     Ankle AH NR ?5.8 NR ?4.0 NR Ankle - AH 9   NR           SNC    Nerve / Sites Rec. Site Peak Lat Ref.  Amp Ref. Segments Distance Peak Diff    ms ms V V  cm ms  L Sural - Ankle (Calf) (1)     Calf Ankle NR ?4.4 NR ?6 Calf - Ankle 14   L  Ulnar - Digit V (Antidromic)     Wrist Dig V NR ?3.1 NR ?15 Wrist - Dig V       A.Elbow Dig V NR  NR  A.Elbow - B.Elbow  NR        A.Elbow - Wrist  NR  L Superficial peroneal - Ankle     Lat leg Ankle NR ?4.4 NR ?6 Lat leg - Ankle 14   R Superficial peroneal - Ankle     Lat leg Ankle NR ?4.4 NR ?6 Lat leg - Ankle 14              EMG       EMG Summary Table    Spontaneous MUAP Recruitment  Muscle IA Fib PSW Fasc Other Amp Dur. Poly Pattern  L. Tibialis anterior Normal None None None _______ Normal Normal Normal Normal  L. Tibialis posterior Normal None None None _______ Normal Normal Normal Normal  L. Peroneus longus Normal None None None _______ Normal Normal Normal Normal  L. Gastrocnemius (Medial head) Normal None None None _______  Normal Normal Normal Normal  L. Vastus lateralis Normal None None None _______ Normal Normal Normal Normal  L. First dorsal interosseous Normal None None None _______ Normal Normal Normal Normal  L. Extensor digitorum communis Normal None None None _______ Normal Normal Normal Normal  L. Pronator teres Normal None None None _______ Normal Normal Normal Normal  L. Biceps brachii Normal None None None _______ Normal Normal Normal Normal  L. Deltoid Normal None None None _______ Normal Normal Normal Normal  L. Triceps brachii Normal None None None _______ Normal Normal Normal Normal  L. Abductor hallucis Normal None None None _______ Normal Normal Normal Normal

## 2018-08-31 ENCOUNTER — Other Ambulatory Visit: Payer: Self-pay | Admitting: Family Medicine

## 2018-08-31 DIAGNOSIS — E1151 Type 2 diabetes mellitus with diabetic peripheral angiopathy without gangrene: Secondary | ICD-10-CM

## 2018-08-31 DIAGNOSIS — E1165 Type 2 diabetes mellitus with hyperglycemia: Principal | ICD-10-CM

## 2018-08-31 DIAGNOSIS — IMO0002 Reserved for concepts with insufficient information to code with codable children: Secondary | ICD-10-CM

## 2018-08-31 NOTE — Telephone Encounter (Signed)
Copied from [place] [id number]. Topic: Quick Communication - Rx Refill/Question >> Aug 31, 2018 11:03 AM Robina Ade, Helene Kelp D wrote: Medication: glucose blood (FREESTYLE TEST STRIPS) test strip  Has the patient contacted their pharmacy? Yes, patient said he needs them today due to he is out and he has surgery tomorrow. (Agent: If no, request that the patient contact the pharmacy for the refill.) (Agent: If yes, when and what did the pharmacy advise?)  Preferred Pharmacy (with phone number or street name): [facility], [place]: Please be advised that RX refills may take up to 3 business days. We ask that you follow-up with your pharmacy.

## 2018-08-31 NOTE — Telephone Encounter (Signed)
Pt says he missed a call and was calling back.  He says he thinks it is about this med request

## 2018-09-01 ENCOUNTER — Ambulatory Visit: Payer: Medicare Other | Admitting: Anesthesiology

## 2018-09-01 ENCOUNTER — Ambulatory Visit
Admission: RE | Admit: 2018-09-01 | Discharge: 2018-09-01 | Disposition: A | Discharge status: Home / Self Care | Payer: Medicare Other | Attending: Ophthalmology | Admitting: Ophthalmology

## 2018-09-01 ENCOUNTER — Encounter
Admission: RE | Disposition: A | Discharge status: Home / Self Care | Payer: Self-pay | Source: Home / Self Care | Attending: Ophthalmology

## 2018-09-01 DIAGNOSIS — M1990 Unspecified osteoarthritis, unspecified site: Secondary | ICD-10-CM | POA: Insufficient documentation

## 2018-09-01 DIAGNOSIS — E119 Type 2 diabetes mellitus without complications: Secondary | ICD-10-CM | POA: Diagnosis not present

## 2018-09-01 DIAGNOSIS — H0589 Other disorders of orbit: Secondary | ICD-10-CM | POA: Diagnosis not present

## 2018-09-01 DIAGNOSIS — H02131 Senile ectropion of right upper eyelid: Secondary | ICD-10-CM | POA: Diagnosis not present

## 2018-09-01 DIAGNOSIS — Z952 Presence of prosthetic heart valve: Secondary | ICD-10-CM | POA: Insufficient documentation

## 2018-09-01 DIAGNOSIS — Z950 Presence of cardiac pacemaker: Secondary | ICD-10-CM | POA: Insufficient documentation

## 2018-09-01 DIAGNOSIS — Z951 Presence of aortocoronary bypass graft: Secondary | ICD-10-CM | POA: Insufficient documentation

## 2018-09-01 DIAGNOSIS — H02132 Senile ectropion of right lower eyelid: Secondary | ICD-10-CM | POA: Diagnosis not present

## 2018-09-01 DIAGNOSIS — H0289 Other specified disorders of eyelid: Secondary | ICD-10-CM | POA: Diagnosis not present

## 2018-09-01 DIAGNOSIS — G4730 Sleep apnea, unspecified: Secondary | ICD-10-CM | POA: Diagnosis not present

## 2018-09-01 DIAGNOSIS — I10 Essential (primary) hypertension: Secondary | ICD-10-CM | POA: Insufficient documentation

## 2018-09-01 DIAGNOSIS — Z889 Allergy status to unspecified drugs, medicaments and biological substances status: Secondary | ICD-10-CM | POA: Diagnosis not present

## 2018-09-01 DIAGNOSIS — H02134 Senile ectropion of left upper eyelid: Secondary | ICD-10-CM | POA: Diagnosis not present

## 2018-09-01 DIAGNOSIS — Z886 Allergy status to analgesic agent status: Secondary | ICD-10-CM | POA: Insufficient documentation

## 2018-09-01 DIAGNOSIS — H02101 Unspecified ectropion of right upper eyelid: Secondary | ICD-10-CM | POA: Diagnosis not present

## 2018-09-01 DIAGNOSIS — H02102 Unspecified ectropion of right lower eyelid: Secondary | ICD-10-CM | POA: Insufficient documentation

## 2018-09-01 DIAGNOSIS — E739 Lactose intolerance, unspecified: Secondary | ICD-10-CM | POA: Insufficient documentation

## 2018-09-01 DIAGNOSIS — I2510 Atherosclerotic heart disease of native coronary artery without angina pectoris: Secondary | ICD-10-CM | POA: Insufficient documentation

## 2018-09-01 DIAGNOSIS — C8330 Diffuse large B-cell lymphoma, unspecified site: Secondary | ICD-10-CM | POA: Insufficient documentation

## 2018-09-01 DIAGNOSIS — H02135 Senile ectropion of left lower eyelid: Secondary | ICD-10-CM | POA: Diagnosis not present

## 2018-09-01 DIAGNOSIS — E65 Localized adiposity: Secondary | ICD-10-CM | POA: Insufficient documentation

## 2018-09-01 HISTORY — DX: Presence of dental prosthetic device (complete) (partial): Z97.2

## 2018-09-01 HISTORY — PX: ORBITAL LESION EXCISION: SHX6682

## 2018-09-01 HISTORY — PX: ECTROPION REPAIR: SHX357

## 2018-09-01 LAB — GLUCOSE, CAPILLARY
Glucose-Capillary: 171 mg/dL — ABNORMAL HIGH (ref 70–99)
Glucose-Capillary: 137 mg/dL — ABNORMAL HIGH (ref 70–99)

## 2018-09-01 SURGERY — EXCISION, LESION, ORBIT
Anesthesia: Monitor Anesthesia Care | Site: Eye | Laterality: Right

## 2018-09-01 MED ORDER — LACTATED RINGERS IV SOLN: INTRAVENOUS | Status: DC

## 2018-09-01 MED ORDER — PROPOFOL 10 MG/ML IV BOLUS
INTRAVENOUS | Status: DC | PRN
  Administered 2018-09-01: 50 mg via INTRAVENOUS

## 2018-09-01 MED ORDER — ALFENTANIL 500 MCG/ML IJ INJ
INJECTION | INTRAVENOUS | Status: DC | PRN
  Administered 2018-09-01 (×2): 250 ug via INTRAVENOUS

## 2018-09-01 MED ORDER — TETRACAINE HCL 0.5 % OP SOLN
OPHTHALMIC | Status: DC | PRN
  Administered 2018-09-01: 1 [drp] via OPHTHALMIC

## 2018-09-01 MED ORDER — OXYCODONE HCL 5 MG/5ML PO SOLN: 5.0000 mg | Freq: Once | ORAL | Status: DC | PRN

## 2018-09-01 MED ORDER — LIDOCAINE HCL (CARDIAC) PF 100 MG/5ML IV SOSY
PREFILLED_SYRINGE | INTRAVENOUS | Status: DC | PRN
  Administered 2018-09-01: 30 mg via INTRAVENOUS

## 2018-09-01 MED ORDER — ERYTHROMYCIN 5 MG/GM OP OINT: TOPICAL_OINTMENT | OPHTHALMIC | 2 refills | Status: DC

## 2018-09-01 MED ORDER — NEOMYCIN-POLYMYXIN-DEXAMETH 3.5-10000-0.1 OP SUSP: OPHTHALMIC | 0 refills | Status: DC

## 2018-09-01 MED ORDER — LIDOCAINE-EPINEPHRINE 2 %-1:100000 IJ SOLN
INTRAMUSCULAR | Status: DC | PRN
  Administered 2018-09-01: 5 mL via OPHTHALMIC

## 2018-09-01 MED ORDER — ERYTHROMYCIN 5 MG/GM OP OINT
TOPICAL_OINTMENT | OPHTHALMIC | Status: DC | PRN
  Administered 2018-09-01: 1 via OPHTHALMIC

## 2018-09-01 MED ORDER — OXYCODONE HCL 5 MG PO TABS: 5.0000 mg | ORAL_TABLET | Freq: Once | ORAL | Status: DC | PRN

## 2018-09-01 MED ORDER — TRAMADOL HCL 50 MG PO TABS: ORAL_TABLET | ORAL | 0 refills | Status: DC

## 2018-09-01 MED ORDER — GLUCOSE BLOOD VI STRP: ORAL_STRIP | 0 refills | Status: DC

## 2018-09-01 MED ORDER — BSS IO SOLN
INTRAOCULAR | Status: DC | PRN
  Administered 2018-09-01: 15 mL

## 2018-09-01 MED ORDER — ONDANSETRON HCL 4 MG/2ML IJ SOLN
INTRAMUSCULAR | Status: DC | PRN
  Administered 2018-09-01: 4 mg via INTRAVENOUS

## 2018-09-01 MED ORDER — DEXMEDETOMIDINE HCL 200 MCG/2ML IV SOLN
INTRAVENOUS | Status: DC | PRN
  Administered 2018-09-01: 10 ug via INTRAVENOUS

## 2018-09-01 MED ORDER — LACTATED RINGERS IV SOLN
INTRAVENOUS | Status: DC | PRN
  Administered 2018-09-01: 08:00:00 via INTRAVENOUS

## 2018-09-01 MED ORDER — TOBRAMYCIN-DEXAMETHASONE 0.3-0.1 % OP SUSP: 1.0000 [drp] | Freq: Four times a day (QID) | OPHTHALMIC | 0 refills | Status: AC

## 2018-09-01 SURGICAL SUPPLY — 25 items
APPLICATOR COTTON TIP WD 3 STR (MISCELLANEOUS) ×6 IMPLANT
BLADE SURG 15 STRL LF DISP TIS (BLADE) ×1 IMPLANT
BLADE SURG 15 STRL SS (BLADE) ×3
CORD BIP STRL DISP 12FT (MISCELLANEOUS) ×3 IMPLANT
DRAPE HEAD BAR (DRAPES) ×3 IMPLANT
GAUZE SPONGE 4X4 12PLY STRL (GAUZE/BANDAGES/DRESSINGS) ×3 IMPLANT
GAUZE SPONGE NON-WVN 2X2 STRL (MISCELLANEOUS) ×10 IMPLANT
GLOVE SURG LX 7.0 MICRO (GLOVE) ×4
GLOVE SURG LX STRL 7.0 MICRO (GLOVE) ×2 IMPLANT
MARKER SKIN XFINE TIP W/RULER (MISCELLANEOUS) ×3 IMPLANT
NDL FILTER BLUNT 18X1 1/2 (NEEDLE) ×1 IMPLANT
NDL HYPO 30X.5 LL (NEEDLE) ×2 IMPLANT
NEEDLE FILTER BLUNT 18X 1/2SAF (NEEDLE) ×2
NEEDLE FILTER BLUNT 18X1 1/2 (NEEDLE) ×1 IMPLANT
NEEDLE HYPO 30X.5 LL (NEEDLE) ×6 IMPLANT
PACK ENT CUSTOM (PACKS) ×3 IMPLANT
SOL PREP PVP 2OZ (MISCELLANEOUS) ×3
SOLUTION PREP PVP 2OZ (MISCELLANEOUS) ×1 IMPLANT
SPONGE VERSALON 2X2 STRL (MISCELLANEOUS) ×30
SUT MERSILENE 4-0 S-2 (SUTURE) ×3 IMPLANT
SUT PLAIN GUT (SUTURE) ×3 IMPLANT
SUT VICRYL 7 0 TG140 8 (SUTURE) ×2 IMPLANT
SYR 10ML LL (SYRINGE) ×3 IMPLANT
SYR 3ML LL SCALE MARK (SYRINGE) ×3 IMPLANT
WATER STERILE IRR 250ML POUR (IV SOLUTION) ×3 IMPLANT

## 2018-09-01 NOTE — Op Note (Signed)
Preoperative Diagnosis:   Right orbital lesion Eyelid laxity with ectropion, right lower eyelid(s). Eyelid laxity with ectropion, right upper eyelid(s)  Postoperative Diagnosis:   Same.  Procedure(s) Performed:   Right anterior Orbitotomy without bone excision for lesion removal  Lateral tarsal strip procedure, right lower eyelid(s). Lateral tarsal strip procedure, right upper eyelid(s).  Teaching Surgeon: Philis Pique. Vickki Muff, M.D.  Assistants: none  Anesthesia: MAC  Specimens: Right orbital lesion.  Estimated Blood Loss: Minimal.  Complications: None.  Operative Findings: None   Procedure:   Allergies were reviewed and the patient is allergic to Benazepril; Hctz [hydrochlorothiazide]; Aspirin; and Lactose intolerance (gi).    After discussing the risks, benefits, complications, and alternatives with the patient, appropriate informed consent was obtained. The patient was brought to the operating suite and reclined supine. Time out was conducted and the patient was sedated.  Local anesthetic consisting of a 50/50 mixture of 2% lidocaine with epinephrine and 0.75% bupivacaine with added Hylenex was injected subcutaneously to the right lateral canthal region(s), lower and upper eyelid(s). Additional anesthetic was injected subconjunctivally to the right upper and lower eyelid(s) and over the orbital lesion. Finally, anesthetic was injected down to the periosteum of the right lateral orbital rim(s).  After adequate local was instilled, the patient was prepped and draped in the usual sterile fashion for eyelid surgery. Attention was turned to the right lateral canthal angle. Westcott scissors were used to create a lateral canthotomy. Hemostasis was obtained with bipolar cautery. An inferior and superior cantholysis was then performed with additional bipolar hemostasis. The anterior and posterior lamella of both the upper and lower eyelids were divided for approximately 10 mm.  A strip of the  epithelium was excised off the superior margin of both tarsal strips and conjunctiva and retractors were incised off the margins of the tarsal strips.    A lid speculum was then placed along with a corneal protector.  Wescott scissors and smooth forceps were used to dissect through conjunctiva and T9's capsule laterally over the orbital lesion.  The lesion was bluntly dissected from the overlying attachments, cauterized towards the pedicle base, and excised.  This was passed off to be placed in formalin and sent for pathologic evaluation.  The conjunctiva and tenons margins were closed with a running 7-0 Vicryl suture.  Hemostasis was achieved with careful bipolar cautery  Two double-armed 4-0 Mersilene suture were then passed  - each arm of one through the terminal portion of the upper then the second through the lower lower tarsal strip with  locking passes. Each arm of the suture was then passed through the periosteum of the inner portion of the lateral orbital rim at the level of Whitnall's tubercle. The sutures were advanced and this provided nice elevation and tightening of both eyelids.  2 interrupted 7-0 Vicryl sutures were used to adhere the margins of the tarsal strips together. Once the suture was secured, a thin strip of follicle-bearing skin was excised off the upper and lower eyelids. The lateral canthal angle was reformed with an interrupted 7-0 Vicryl suture. Orbicularis was reapproximated with horizontal subcuticular 7-0 Vicryl sutures. The skin was closed with interrupted 6-0 fast absorbing plain gut sutures.   The patient tolerated the procedure well. e ointment was applied to the incision site(s) followed by ice packs. The patient was taken to the recovery area where they recovered without difficulty.  Post-Op Plan/Instructions:  The patient was instructed to use ice packs frequently for the next 48 hours.  They were  instructed to use erythromycin ophthalmic ointment on their incisions 4  times a day for the next 12 to 14 days and Maxitrol drops 4 times a day for 2 weeks.  They were given a prescription for tramadol for pain control should Tylenol not be effective.  They were asked to to follow up at the [facility] in [place], [place] in 2 weeks' time or sooner as needed for problems.   M. Vickki Muff, M.D. Attending,Ophthalmology

## 2018-09-01 NOTE — Anesthesia Procedure Notes (Signed)
Procedure Name: MAC Date/Time: 09/01/2018 8:42 AM Performed by: Jeannene Patella, CRNA Pre-anesthesia Checklist: Patient identified, Emergency Drugs available, Suction available, Patient being monitored and Timeout performed Patient Re-evaluated:Patient Re-evaluated prior to induction Oxygen Delivery Method: Nasal cannula

## 2018-09-01 NOTE — Anesthesia Preprocedure Evaluation (Signed)
Anesthesia Evaluation  Patient identified by MRN, date of birth, ID band  Reviewed: NPO status   History of Anesthesia Complications Negative for: history of anesthetic complications  Airway Mallampati: II  TM Distance: >3 FB Neck ROM: full    Dental  (+) Partial Upper   Pulmonary sleep apnea and Continuous Positive Airway Pressure Ventilation , Recent URI  (resolved 1 week ago),    Pulmonary exam normal        Cardiovascular Exercise Tolerance: Good hypertension, + CAD and + CABG (2001)  Normal cardiovascular exam+ dysrhythmias (holter monitor has demonstrated HRs in 50s and [place]ink[place]ach ) + pacemaker + Valvular Problems/Murmurs (s/p TAVR)      Neuro/Psych negative neurological ROS  negative psych ROS   GI/Hepatic Neg liver ROS, GERD  Controlled,  Endo/Other  diabetes  Renal/GU negative Renal ROS  negative genitourinary   Musculoskeletal  (+) Arthritis , gout   Abdominal   Peds  Hematology  large B cell lymphoma (   Anesthesia Other Findings Last Eliquis: 3 days ago.  Cards cleared: 08/2018 : dr. Percival Spanish;   Magnet at bedside.  cr=1.44;  holter:2018: He does have some brief episodes of 2:1 heart block at which time his pulse might be 30.  However, this is brief.  He was not having symptoms related to this.  I think that he can continue with current medical therapy.;  pacer: mar 2019:  catH: jan 2019:  1.  Severe native three-vessel coronary artery disease with moderate distal left main stenosis, total occlusion of the LAD, total occlusion of the ramus intermedius, total occlusion of the first OM, and total occlusion of the RCA. 2.  Status post aortocoronary bypass surgery with continued patency of the RIMA to PDA, LIMA to LAD, and sequential saphenous vein graft to ramus intermedius and first OM branches of the circumflex. 3.  Chronic occlusion of the saphenous vein graft to diagonal, the diagonal branch is  collateralized by the apical LAD 4.  Severe calcific aortic stenosis with a mean transvalvular gradient of 51 mmHg  echo: 09/2017: - Compared to a prior study in 08/2017, the LVEF is lower at 50%.   The TAVR valve is stable. The aorta is dilated to 4.5 cm.;     Reproductive/Obstetrics                             Anesthesia Physical Anesthesia Plan  ASA: III  Anesthesia Plan: MAC   Post-op Pain Management:    Induction:   PONV Risk Score and Plan:   Airway Management Planned:   Additional Equipment:   Intra-op Plan:   Post-operative Plan:   Informed Consent: I have reviewed the patients History and Physical, chart, labs and discussed the procedure including the risks, benefits and alternatives for the proposed anesthesia with the patient or authorized representative who has indicated his/her understanding and acceptance.       Plan Discussed with: CRNA  Anesthesia Plan Comments:         Anesthesia Quick Evaluation

## 2018-09-01 NOTE — H&P (Signed)
See the history and physical completed at [facility] on 08/18/2018 and scanned into the chart.

## 2018-09-01 NOTE — Anesthesia Postprocedure Evaluation (Signed)
Anesthesia Post Note  Patient: Richard [name]  Procedure(s) Performed: ORBITOTOMY WITHOUT BONE FLAP WITH REMOVAL OF LESION RIGHT (Right Eye) REPAIR OF ECTROPION BILATERAL upper and lower (Right Eye)  Patient location during evaluation: PACU Anesthesia Type: MAC Level of consciousness: awake and alert Pain management: pain level controlled Vital Signs Assessment: post-procedure vital signs reviewed and stable Respiratory status: spontaneous breathing, nonlabored ventilation, respiratory function stable and patient connected to nasal cannula oxygen Cardiovascular status: stable and blood pressure returned to baseline Postop Assessment: no apparent nausea or vomiting Anesthetic complications: no    Johonna Binette

## 2018-09-01 NOTE — Interval H&P Note (Signed)
History and Physical Interval Note:  09/01/2018 9:18 AM  Richard [name]  has presented today for surgery, with the diagnosis of H02.89 ORBITAL LESION H02.132 ECTROPION RIGHT LOWER LID H02.135 ECTROPION Right Upper LID   The various methods of treatment have been discussed with the patient and family. After consideration of risks, benefits and other options for treatment, the patient has consented to  Procedure(s) with comments: ORBITOTOMY WITHOUT BONE FLAP WITH REMOVAL OF LESION RIGHT (Right) REPAIR OF ECTROPION BILATERAL upper and lower (Right) - Diabetic - oral meds sleep apnea as a surgical intervention .  The patient's history has been reviewed, patient examined, no change in status, stable for surgery.  I have reviewed the patient's chart and labs.  Questions were answered to the patient's satisfaction.     Vickki Muff, Jahzion Brogden M

## 2018-09-01 NOTE — Transfer of Care (Addendum)
Immediate Anesthesia Transfer of Care Note  Patient: Richard [name]  Procedure(s) Performed: ORBITOTOMY WITHOUT BONE FLAP WITH REMOVAL OF LESION RIGHT (Right Eye) REPAIR OF ECTROPION BILATERAL upper and lower (Right Eye)  Patient Location: PACU  Anesthesia Type: MAC  Level of Consciousness: awake, alert  and patient cooperative  Airway and Oxygen Therapy: Patient Spontanous Breathing and Patient connected to supplemental oxygen  Post-op Assessment: Post-op Vital signs reviewed, Patient's Cardiovascular Status Stable, Respiratory Function Stable, Patent Airway and No signs of Nausea or vomiting  Post-op Vital Signs: Reviewed and stable  Complications: No apparent anesthesia complications

## 2018-09-02 ENCOUNTER — Encounter: Payer: Self-pay | Admitting: Ophthalmology

## 2018-09-03 LAB — SURGICAL PATHOLOGY

## 2018-09-12 ENCOUNTER — Other Ambulatory Visit: Payer: Self-pay | Admitting: Family Medicine

## 2018-09-15 ENCOUNTER — Telehealth: Payer: Self-pay | Admitting: Cardiology

## 2018-09-15 NOTE — Telephone Encounter (Signed)
Patient called to reschedule 2D echocardiogram and follow-up appointment with [name], PA for his yearly follow-up for TAVR.  Due to the coronavirus pandemic it was recommended that his appointment for both be rescheduled given his increased risk both from advanced age as well as comorbidities including prior History of cancer.  I have informed him that he will be called at a later date to reschedule both the echo and the office follow-up visit.  Patient was thankful for the call.

## 2018-09-16 ENCOUNTER — Other Ambulatory Visit (HOSPITAL_COMMUNITY): Payer: Medicare Other

## 2018-09-16 ENCOUNTER — Ambulatory Visit: Payer: Medicare Other | Admitting: Physician Assistant

## 2018-09-16 NOTE — Telephone Encounter (Addendum)
  [place] VALVE TEAM  Patient contacted about 1 year TAVR follow up. Given Covid-19 pandemic we have asked the patient to not come into the office for appointment to limit exposure. The patient consented to do consult over the phone. The patient, Richard [name], and [name] PA-C were present during the telephone encounter.   Chief Complaint: 1 year s/p TAVR.    HPI: Richard [name] is a 78 y.o. male with a history of CAD s/p CABG, GERD, HTN, HLD, DM2, obesity, sleep apnea, chronic diastolic CHF, diffuse large B cell lymphoma, paroxysmal atrial fib/flutter, complete heart block s/p PPM and severe aortic stenosis s/p TAVR 09/09/17.  Patient is currently doing well with no new symptoms. No CP or SOB. No LE edema, orthopnea or PND. No dizziness or syncope. No blood in stool or urine. No palpitations. He is still able to play golf 3x a week. Mostly limited by hip arthritis.   KCCQ completed over the phone.   Assessment and Plan:  Patient has NYHA class II symptoms. The patient is no longer taking aspirin. Continue on Eliquis indefinitely. One year echo rescheduled 11/05/18. Patient has never had follow up outside of a wound check for his pacemaker which was implanted after TAVR. He has some questions about device connectivity. Will reach out to device clinic to get him hooked back in.   Diagnosis: s/p TAVR, ICD Z95.2. Total time spent on phone ~15 minutes.   Angelena Form PA-C  [facility]

## 2018-09-18 ENCOUNTER — Ambulatory Visit: Payer: Self-pay

## 2018-09-18 NOTE — Telephone Encounter (Addendum)
Pt called to seek advice.Richard [name] He states that his BS after lunch dropped.  He states that he just doesn't have an appetite in the morning.  He was told to drink gatorade before bed. Last night he didn't. Today fasting his BS was 130. He has small breakfast and lunch. after he felt his legs get weak and checked his sugar which it was 50.  He had gatorade and glucose pills and sugar returned to 150's. Pt called for advice for keeping BS level.  We discussed following with a complex carbs such as Cheese to follow the gatorade. Pt read home care advice and urged to call back for medication adjustment as necessary. Pt verbalized understanding of all instructions  Reason for Disposition . Low blood sugar prevention, questions about  Answer Assessment - Initial Assessment Questions 1. SYMPTOMS: "What symptoms are you concerned about?"     Low BS 50 weakness in  2. ONSET:  "When did the symptoms start?"    today 3. BLOOD GLUCOSE: "What is your blood glucose level?"      BS 51legs  Sugar is 145 4. USUAL RANGE: "What is your blood glucose level usually?" (e.g., usual fasting morning value, usual evening value)    130-145 5. TYPE 1 or 2:  "Do you know what type of diabetes you have?"  (e.g., Type 1, Type 2, Gestational; doesn't know)      Type 2 6. INSULIN: "Do you take insulin?" "What type of insulin(s) do you use? What is the mode of delivery? (syringe, pen (e.g., injection or  pump)      glimperride Metformin 7. DIABETES PILLS: "Do you take any pills for your diabetes?"     pills 8. OTHER SYMPTOMS: "Do you have any symptoms?" (e.g., fever, frequent urination, difficulty breathing, vomiting)    urination frequent but takes 9. LOW BLOOD GLUCOSE TREATMENT: "What have you done so far to treat the low blood glucose level?"    Gatorade  10. FOOD: "When did you last eat or drink?"      lunch 11. ALONE: "Are you alone right now or is someone with you?"       Ok now Sister is a phone call away 12. PREGNANCY:  "Is there any chance you are pregnant?" "When was your last menstrual period?"      NA  Protocols used: DIABETES - LOW BLOOD SUGAR-A-AH

## 2018-09-18 NOTE — Telephone Encounter (Signed)
See other triage encounter.

## 2018-09-18 NOTE — Telephone Encounter (Signed)
Pt calling about low blood sugar of 51. Pt having hypoglycemia of 51. Pt having weakness of arms and legs. Pt is drinking Gatorade to try and bring I up. Agent lost pt while trying to transfer. Called pt twice and left message to call back. Putting pt in call back queue.

## 2018-09-21 ENCOUNTER — Other Ambulatory Visit: Payer: Self-pay | Admitting: Family Medicine

## 2018-10-02 ENCOUNTER — Encounter: Payer: Self-pay | Admitting: Thoracic Surgery (Cardiothoracic Vascular Surgery)

## 2018-10-08 ENCOUNTER — Other Ambulatory Visit: Payer: Self-pay

## 2018-10-08 ENCOUNTER — Ambulatory Visit (INDEPENDENT_AMBULATORY_CARE_PROVIDER_SITE_OTHER): Payer: Medicare Other | Admitting: *Deleted

## 2018-10-08 DIAGNOSIS — I441 Atrioventricular block, second degree: Secondary | ICD-10-CM | POA: Diagnosis not present

## 2018-10-12 LAB — CUP PACEART REMOTE DEVICE CHECK
Battery Remaining Longevity: 68 mo
Battery Voltage: 2.97 V
Brady Statistic RA Percent Paced: 0.02 %
Brady Statistic RV Percent Paced: 90.62 %
Date Time Interrogation Session: 20200410154934
Implantable Lead Implant Date: 20190313
Implantable Lead Implant Date: 20190313
Implantable Lead Location: 753859
Implantable Lead Location: 753860
Implantable Lead Model: 3830
Implantable Lead Model: 5076
Implantable Pulse Generator Implant Date: 20190313
Lead Channel Impedance Value: 266 Ohm
Lead Channel Impedance Value: 285 Ohm
Lead Channel Impedance Value: 399 Ohm
Lead Channel Impedance Value: 418 Ohm
Lead Channel Pacing Threshold Amplitude: 1.125 V
Lead Channel Pacing Threshold Pulse Width: 0.4 ms
Lead Channel Sensing Intrinsic Amplitude: 1.875 mV
Lead Channel Sensing Intrinsic Amplitude: 1.875 mV
Lead Channel Sensing Intrinsic Amplitude: 4 mV
Lead Channel Sensing Intrinsic Amplitude: 4 mV
Lead Channel Setting Pacing Amplitude: 3.5 V
Lead Channel Setting Pacing Amplitude: 3.5 V
Lead Channel Setting Pacing Pulse Width: 1 ms
Lead Channel Setting Sensing Sensitivity: 1.2 mV

## 2018-10-16 ENCOUNTER — Encounter: Payer: Self-pay | Admitting: Cardiology

## 2018-10-16 NOTE — Progress Notes (Signed)
Remote pacemaker transmission.   

## 2018-10-20 ENCOUNTER — Other Ambulatory Visit: Payer: Self-pay | Admitting: *Deleted

## 2018-10-20 NOTE — Patient Outreach (Signed)
[facility]) Care Management  10/20/2018  [place] [facility] 1941-04-25 [id number]    Completed another outreach attempt: 10/20/2018 Comorbidities: DM/HTN/HF   Subjective: Initial successful telephone call to patient's preferred number in order to perform a screening; 2 HIPAA identifiers verified. Explained purpose of call and completed screening assessment.   Plan:  Will refer to [facility] case management to high triglycerides and pending A1C for diabetes management.   Raina Mina, RN Care Management Coordinator [place] Office [phone]

## 2018-10-20 NOTE — Patient Outreach (Signed)
[place] [facility]) Care Management  10/20/2018  [place] Brenneman [facility] [birth year] [id number]    Referral: Screening  Initial outreach to pt as RN explained the purpose for today's call. Pt unable to talk at this time and requested a call back later today. Will follow up accordingly as requested.  Raina Mina, RN Care Management Coordinator [place] Office [phone]

## 2018-10-21 ENCOUNTER — Encounter: Payer: Self-pay | Admitting: *Deleted

## 2018-10-21 ENCOUNTER — Other Ambulatory Visit: Payer: Self-pay | Admitting: *Deleted

## 2018-10-21 NOTE — Patient Outreach (Addendum)
[facility] [facility] Care Management  10/21/2018  Richard [name] [facility] Jun 02, 1941 [id number]    Referral received: 10/20/2018 Initial Outreach: 10/21/2018  RN spoke with pt and verified identifiers. RN explained the purpose for today's call and inquired further. Pt receptive and provided RN case manager on his history of diabetes. Pt states currently his CBG range around 139-159. Discuss possible goal in reducing his ranges to 130-140 initially that may improve his overall A1C to <7 (pt receptive). States he drinks a lot of Gatorade but willing to reduce to improve his overall CBGs. Pt also states his CGB maybe elevated in the AM but it's probable due to eating dinner at 4pm with no snacks prior to bedtime. RN stressed the importance of consuming several small meals throughout the day with a bedtime snack to help stabilize his BS.  Discussed reduce some existing food items and possible substitute to assist with again improving pt's overall BS (pt receptive).  Pt also indicates his triglycerides were elevated and he started a new medication to assist with lowering his reading. RN educated pt on this topic indicating this readings may also be improved with th change in his dietary intake.   Based upon what was discussed today RN discussed a plan of care with goals and interventions that will assist pt with improving his management of care related to his diabetes. Pt very grateful and receptive to this plan of care. RN able to obtain the initial assessment and will send today's report to pt's primary provider. Will plan to follow up next month with ongoing case management services related to pt's diabetes management.  [facility] CM Care Plan Problem One     Most Recent Value  Care Plan Problem One  Diabetes AIC over 7  Role Documenting the Problem One  Care Management Coordinator  Care Plan for Problem One  Active  [facility] Long Term Goal   Pt will reduce his A1C and overall CBG readings over the next 90 days.   [facility] Long Term Goal Start Date  10/21/18  Interventions for Problem One Long Term Goal  Will discuss the importance of reducing his A1C and the risk involved pt does not manage this condition.  Will educate on ways to reduce his readings and offered available resources to assist.  [facility] CM Short Term Goal #1   Pt will reduce high carb food and drink items from his diet over the next 30 days.  [facility] CM Short Term Goal #1 Start Date  10/21/18  Interventions for Short Term Goal #1  Discussed reduce certain food and drink items that pt consumes each day that affects his overall CBG /A1C levels. Will educate on healthy food items and encourage pt on reading the label with purchased food items for low carb food items to reduce his overall glucose levels. Will also discuss the consumptions of several small meals throughout the day with a bedtime snack to assist with stabilizing his glucose levels.   [facility] CM Short Term Goal #2   Adherence to medical appointments and laboratory testing over the next 30 days.  [facility] CM Short Term Goal #2 Start Date  10/21/18  Interventions for Short Term Goal #2  Will verfiy upcoming appointments and verify pt has suifficent transportation. Will strongly encouraged attendance.       Raina Mina, RN Care Management Coordinator [place] Office [phone]

## 2018-10-26 ENCOUNTER — Inpatient Hospital Stay: Payer: Medicare Other | Attending: Hematology & Oncology

## 2018-10-26 ENCOUNTER — Other Ambulatory Visit: Payer: Self-pay

## 2018-10-26 ENCOUNTER — Encounter: Payer: Self-pay | Admitting: Hematology & Oncology

## 2018-10-26 ENCOUNTER — Inpatient Hospital Stay (HOSPITAL_BASED_OUTPATIENT_CLINIC_OR_DEPARTMENT_OTHER): Payer: Medicare Other | Admitting: Family

## 2018-10-26 ENCOUNTER — Inpatient Hospital Stay: Payer: Medicare Other

## 2018-10-26 VITALS — BP 115/61 | HR 59 | Temp 98.1°F | Resp 17 | Wt 251.1 lb

## 2018-10-26 DIAGNOSIS — D510 Vitamin B12 deficiency anemia due to intrinsic factor deficiency: Secondary | ICD-10-CM | POA: Insufficient documentation

## 2018-10-26 DIAGNOSIS — D519 Vitamin B12 deficiency anemia, unspecified: Secondary | ICD-10-CM

## 2018-10-26 DIAGNOSIS — G629 Polyneuropathy, unspecified: Secondary | ICD-10-CM

## 2018-10-26 DIAGNOSIS — Z79899 Other long term (current) drug therapy: Secondary | ICD-10-CM | POA: Diagnosis not present

## 2018-10-26 DIAGNOSIS — C8332 Diffuse large B-cell lymphoma, intrathoracic lymph nodes: Secondary | ICD-10-CM | POA: Insufficient documentation

## 2018-10-26 DIAGNOSIS — D508 Other iron deficiency anemias: Secondary | ICD-10-CM

## 2018-10-26 LAB — CMP (CANCER CENTER ONLY)
ALT: 12 U/L (ref 0–44)
AST: 16 U/L (ref 15–41)
Albumin: 4.6 g/dL (ref 3.5–5.0)
Alkaline Phosphatase: 73 U/L (ref 38–126)
Anion gap: 12 (ref 5–15)
BUN: 26 mg/dL — ABNORMAL HIGH (ref 8–23)
CO2: 25 mmol/L (ref 22–32)
Calcium: 10 mg/dL (ref 8.9–10.3)
Chloride: 102 mmol/L (ref 98–111)
Creatinine: 1.51 mg/dL — ABNORMAL HIGH (ref 0.61–1.24)
GFR, Est AFR Am: 50 mL/min — ABNORMAL LOW (ref >60)
GFR, Estimated: 43 mL/min — ABNORMAL LOW (ref >60)
Glucose, Bld: 120 mg/dL — ABNORMAL HIGH (ref 70–99)
Potassium: 4.1 mmol/L (ref 3.5–5.1)
Sodium: 139 mmol/L (ref 135–145)
Total Bilirubin: 0.7 mg/dL (ref 0.3–1.2)
Total Protein: 7.1 g/dL (ref 6.5–8.1)

## 2018-10-26 LAB — VITAMIN B12: Vitamin B-12: 168 pg/mL — ABNORMAL LOW (ref 180–914)

## 2018-10-26 LAB — CBC WITH DIFFERENTIAL (CANCER CENTER ONLY)
Abs Immature Granulocytes: 0.07 10*3/uL (ref 0.00–0.07)
Basophils Absolute: 0.1 10*3/uL (ref 0.0–0.1)
Basophils Relative: 1 %
Eosinophils Absolute: 0.1 10*3/uL (ref 0.0–0.5)
Eosinophils Relative: 2 %
HCT: 37.8 % — ABNORMAL LOW (ref 39.0–52.0)
Hemoglobin: 12 g/dL — ABNORMAL LOW (ref 13.0–17.0)
Immature Granulocytes: 1 %
Lymphocytes Relative: 21 %
Lymphs Abs: 1.5 10*3/uL (ref 0.7–4.0)
MCH: 29.1 pg (ref 26.0–34.0)
MCHC: 31.7 g/dL (ref 30.0–36.0)
MCV: 91.7 fL (ref 80.0–100.0)
Monocytes Absolute: 0.7 10*3/uL (ref 0.1–1.0)
Monocytes Relative: 9 %
Neutro Abs: 4.9 10*3/uL (ref 1.7–7.7)
Neutrophils Relative %: 66 %
Platelet Count: 191 10*3/uL (ref 150–400)
RBC: 4.12 MIL/uL — ABNORMAL LOW (ref 4.22–5.81)
RDW: 16.7 % — ABNORMAL HIGH (ref 11.5–15.5)
WBC Count: 7.3 10*3/uL (ref 4.0–10.5)
nRBC: 0 % (ref 0.0–0.2)

## 2018-10-26 LAB — RETICULOCYTES
Immature Retic Fract: 27.7 % — ABNORMAL HIGH (ref 2.3–15.9)
RBC.: 4 MIL/uL — ABNORMAL LOW (ref 4.22–5.81)
Retic Count, Absolute: 66 10*3/uL (ref 19.0–186.0)
Retic Ct Pct: 1.7 % (ref 0.4–3.1)

## 2018-10-26 LAB — IRON AND TIBC
Iron: 54 ug/dL (ref 45–182)
Saturation Ratios: 14 % — ABNORMAL LOW (ref 17.9–39.5)
TIBC: 375 ug/dL (ref 250–450)
UIBC: 321 ug/dL

## 2018-10-26 LAB — FERRITIN: Ferritin: 568 ng/mL — ABNORMAL HIGH (ref 24–336)

## 2018-10-26 MED ORDER — SODIUM CHLORIDE 0.9% FLUSH
10.0000 mL | INTRAVENOUS | Status: DC | PRN
  Administered 2018-10-26: 10 mL via INTRAVENOUS
  Filled 2018-10-26: qty 10

## 2018-10-26 MED ORDER — HEPARIN SOD (PORK) LOCK FLUSH 100 UNIT/ML IV SOLN
500.0000 [IU] | Freq: Once | INTRAVENOUS | Status: AC
  Administered 2018-10-26: 500 [IU] via INTRAVENOUS
  Filled 2018-10-26: qty 5

## 2018-10-26 MED ORDER — CYANOCOBALAMIN 1000 MCG/ML IJ SOLN
1000.0000 ug | Freq: Once | INTRAMUSCULAR | Status: AC
  Administered 2018-10-26: 1000 ug via INTRAMUSCULAR

## 2018-10-26 MED ORDER — CYANOCOBALAMIN 1000 MCG/ML IJ SOLN
INTRAMUSCULAR | Status: AC
  Filled 2018-10-26: qty 1

## 2018-10-26 MED ORDER — DENOSUMAB 120 MG/1.7ML ~~LOC~~ SOLN
SUBCUTANEOUS | Status: AC
  Filled 2018-10-26: qty 1.7

## 2018-10-26 MED ORDER — DENOSUMAB 120 MG/1.7ML ~~LOC~~ SOLN
120.0000 mg | Freq: Once | SUBCUTANEOUS | Status: AC
  Administered 2018-10-26: 120 mg via SUBCUTANEOUS

## 2018-10-26 NOTE — Progress Notes (Signed)
Hematology and Oncology Follow Up Visit  Richard [name] [id number] 24-Nov-1940 78 y.o. 10/26/2018   Principle Diagnosis:  Diffuse large cell non-Hodgkin's lymphoma (IPI = 3) - NOT "double hit" Pernicious anemia Iron deficiency secondary to bleeding  Past Therapy: R-CHOP - s/p 8 cycles, completed 08/08/2017  Current Therapy:   Vitamin B12 1 mg IM every month Xgeva 120 mg subcu q 3 months  IV iron with Feraheme   Interim History:  [name] is here today for follow-up, B 12 and Xgeva. He is doing well and has no complaints at this time.  He has been golfing 3 days a week with a couple of his friends.  No fever, chills, n/v, cough, rash, dizziness, SOB, chest pain, palpitations, abdominal pain or changes in bowel or bladder habits.  No swelling or tenderness in his extremities.  The neuropathy in his feet is stable and unchanged.  No falls or syncopal episodes.  No lymphadenopathy noted on exam.  No episodes of bleeding, no bruising or petechiae.  He is eating well and staying hydrated. His weight is stable.   ECOG Performance Status: 1 - Symptomatic but completely ambulatory  Medications:  Allergies as of 10/26/2018      Reactions   Benazepril Swelling   angioedema; he is not a candidate for any angiotensin receptor blockers because of this significant allergic reaction. Because of a history of documented adverse serious drug reaction;Medi Alert bracelet  is recommended   Hctz [hydrochlorothiazide] Anaphylaxis, Swelling   Tongue and lip swelling   Aspirin Other (See Comments)   Gastritis, cant take 325 Mg aspirin    Lactose Intolerance (gi) Nausea And Vomiting      Medication List       Accurate as of October 26, 2018  2:52 PM. Always use your most recent med list.        allopurinol 300 MG tablet Commonly known as:  ZYLOPRIM Take 450 mg by mouth daily.   amLODipine 5 MG tablet Commonly known as:  NORVASC Take 1 tablet BID   atorvastatin 80 MG tablet Commonly  known as:  LIPITOR TAKE ONE TABLET BY MOUTH AT BEDTIME   azelastine 0.1 % nasal spray Commonly known as:  ASTELIN Place 2 sprays into both nostrils at bedtime as needed for rhinitis. Use in each nostril as directed   Eliquis 5 MG Tabs tablet Generic drug:  apixaban TAKE 1 TABLET BY MOUTH TWICE A DAY   esomeprazole 40 MG capsule Commonly known as:  NexIUM Take 1 capsule (40 mg total) by mouth daily.   ezetimibe 10 MG tablet Commonly known as:  ZETIA TAKE 1 TABLET BY MOUTH EVERY DAY   fenofibrate 160 MG tablet Take 1 tablet (160 mg total) by mouth daily.   folic acid 1 MG tablet Commonly known as:  FOLVITE Take 2 tablets (2 mg total) by mouth daily.   freestyle lancets USE TWICE A DAY TO CHECK BLOOD SUGAR. DX E11.9   furosemide 40 MG tablet Commonly known as:  LASIX Take 3 tablets (120 mg total) by mouth 2 (two) times daily.   glimepiride 2 MG tablet Commonly known as:  AMARYL TAKE 1 TABLET BY MOUTH DAILY WITH BREAKFAST. NEED OV/LAB WORK   glucose blood test strip Commonly known as:  FREESTYLE TEST STRIPS TEST BLOOD SUGAR TWICE A DAY.  Due for follow up visit   Icosapent Ethyl 1 g Caps Commonly known as:  Vascepa 2 po bid   metFORMIN 1000 MG tablet Commonly known as:  GLUCOPHAGE TAKE 1 & 1/2 TABLETS BY MOUTH EVERY MORNING AND 1 TABLET IN THE EVENING.   metoprolol tartrate 25 MG tablet Commonly known as:  LOPRESSOR TAKE ONE-HALF TABLET BY MOUTH TWICE DAILY   nitroGLYCERIN 0.4 MG SL tablet Commonly known as:  NITROSTAT PLACE 1 TABLET (0.4 MG TOTAL) UNDER THE TONGUE EVERY 5 (FIVE) MINUTES AS NEEDED FOR CHEST PAIN.   polycarbophil 625 MG tablet Commonly known as:  FIBERCON Take 625 mg by mouth daily.   potassium chloride SA 20 MEQ tablet Commonly known as:  Klor-Con M20 Take 2 tablets (40 mEq total) by mouth 2 (two) times daily.   pregabalin 75 MG capsule Commonly known as:  Lyrica Take 1 po q am , 1 po midday and 2 po qhs   psyllium 58.6 % powder  Commonly known as:  METAMUCIL Take 1 packet by mouth 3 (three) times daily.       Allergies:  Allergies  Allergen Reactions  . Benazepril Swelling    angioedema; he is not a candidate for any angiotensin receptor blockers because of this significant allergic reaction. Because of a history of documented adverse serious drug reaction;Medi Alert bracelet  is recommended  . Hctz [Hydrochlorothiazide] Anaphylaxis and Swelling    Tongue and lip swelling   . Aspirin Other (See Comments)    Gastritis, cant take 325 Mg aspirin   . Lactose Intolerance (Gi) Nausea And Vomiting    Past Medical History, Surgical history, Social history, and Family History were reviewed and updated.  Review of Systems: All other 10 point review of systems is negative.   Physical Exam:  weight is 251 lb 1.3 oz (113.9 kg). His oral temperature is 98.1 F (36.7 C). His blood pressure is 115/61 and his pulse is 59 (abnormal). His respiration is 17 and oxygen saturation is 97%.   Wt Readings from Last 3 Encounters:  10/26/18 251 lb 1.3 oz (113.9 kg)  09/01/18 258 lb (117 kg)  08/21/18 255 lb (115.7 kg)    Ocular: Sclerae unicteric, pupils equal, round and reactive to light Ear-nose-throat: Oropharynx clear, dentition fair Lymphatic: No cervical or supraclavicular adenopathy Lungs no rales or rhonchi, good excursion bilaterally Heart regular rate and rhythm, no murmur appreciated Abd soft, nontender, positive bowel sounds, no liver or spleen tip palpated on exam, no fluid wave  MSK no focal spinal tenderness, no joint edema Neuro: non-focal, well-oriented, appropriate affect Breasts: Deferred   Lab Results  Component Value Date   WBC 7.3 10/26/2018   HGB 12.0 (L) 10/26/2018   HCT 37.8 (L) 10/26/2018   MCV 91.7 10/26/2018   PLT 191 10/26/2018   Lab Results  Component Value Date   FERRITIN 594 (H) 07/24/2018   IRON 83 07/24/2018   TIBC 379 07/24/2018   UIBC 296 07/24/2018   IRONPCTSAT 22  07/24/2018   Lab Results  Component Value Date   RETICCTPCT 1.7 10/26/2018   RBC 4.12 (L) 10/26/2018   RBC 4.00 (L) 10/26/2018   RETICCTABS 52.1 11/22/2011   No results found for: Nils Pyle [facility] Lab Results  Component Value Date   IGA 159 03/09/2012   Lab Results  Component Value Date   ALBUMINELP 4.2 07/27/2018   MSPIKE Not Observed 07/27/2018     Chemistry      Component Value Date/Time   NA 139 10/26/2018 1400   NA 137 08/01/2017 0805   NA 144 06/27/2017 0857   K 4.1 10/26/2018 1400   K 3.9 06/27/2017 0857   CL  102 10/26/2018 1400   CL 103 06/27/2017 0857   CO2 25 10/26/2018 1400   CO2 26 06/27/2017 0857   BUN 26 (H) 10/26/2018 1400   BUN 12 08/01/2017 0805   BUN 12 06/27/2017 0857   CREATININE 1.51 (H) 10/26/2018 1400   CREATININE 1.0 06/27/2017 0857      Component Value Date/Time   CALCIUM 10.0 10/26/2018 1400   CALCIUM 9.2 06/27/2017 0857   ALKPHOS 73 10/26/2018 1400   ALKPHOS 128 (H) 06/27/2017 0857   AST 16 10/26/2018 1400   ALT 12 10/26/2018 1400   ALT 24 06/27/2017 0857   BILITOT 0.7 10/26/2018 1400       Impression and Plan: [name] is a very pleasant 78 yo caucasian gentleman with diffuse large B-cell lymphoma (not "double hit" lymphoma). He is doing well and has no complaints at this time.  We will proceed with B 12 and Xgeva today as planned.  We will plan to see him back in another 3 months.  He will contact our office with any questions or concerns. We can certainly see him sooner if need be.   Laverna Peace, NP 4/27/20202:52 PM

## 2018-10-26 NOTE — Patient Instructions (Signed)
Denosumab injection What is this medicine? DENOSUMAB (den oh sue mab) slows bone breakdown. Prolia is used to treat osteoporosis in women after menopause and in men, and in people who are taking corticosteroids for 6 months or more. Xgeva is used to treat a high calcium level due to cancer and to prevent bone fractures and other bone problems caused by multiple myeloma or cancer bone metastases. Xgeva is also used to treat giant cell tumor of the bone. This medicine may be used for other purposes; ask your health care provider or pharmacist if you have questions. COMMON BRAND NAME(S): Prolia, XGEVA What should I tell my health care provider before I take this medicine? They need to know if you have any of these conditions: -dental disease -having surgery or tooth extraction -infection -kidney disease -low levels of calcium or Vitamin D in the blood -malnutrition -on hemodialysis -skin conditions or sensitivity -thyroid or parathyroid disease -an unusual reaction to denosumab, other medicines, foods, dyes, or preservatives -pregnant or trying to get pregnant -breast-feeding How should I use this medicine? This medicine is for injection under the skin. It is given by a health care professional in a hospital or clinic setting. A special MedGuide will be given to you before each treatment. Be sure to read this information carefully each time. For Prolia, talk to your pediatrician regarding the use of this medicine in children. Special care may be needed. For Xgeva, talk to your pediatrician regarding the use of this medicine in children. While this drug may be prescribed for children as young as 13 years for selected conditions, precautions do apply. Overdosage: If you think you have taken too much of this medicine contact a poison control center or emergency room at once. NOTE: This medicine is only for you. Do not share this medicine with others. What if I miss a dose? It is important not to  miss your dose. Call your doctor or health care professional if you are unable to keep an appointment. What may interact with this medicine? Do not take this medicine with any of the following medications: -other medicines containing denosumab This medicine may also interact with the following medications: -medicines that lower your chance of fighting infection -steroid medicines like prednisone or cortisone This list may not describe all possible interactions. Give your health care provider a list of all the medicines, herbs, non-prescription drugs, or dietary supplements you use. Also tell them if you smoke, drink alcohol, or use illegal drugs. Some items may interact with your medicine. What should I watch for while using this medicine? Visit your doctor or health care professional for regular checks on your progress. Your doctor or health care professional may order blood tests and other tests to see how you are doing. Call your doctor or health care professional for advice if you get a fever, chills or sore throat, or other symptoms of a cold or flu. Do not treat yourself. This drug may decrease your body's ability to fight infection. Try to avoid being around people who are sick. You should make sure you get enough calcium and vitamin D while you are taking this medicine, unless your doctor tells you not to. Discuss the foods you eat and the vitamins you take with your health care professional. See your dentist regularly. Brush and floss your teeth as directed. Before you have any dental work done, tell your dentist you are receiving this medicine. Do not become pregnant while taking this medicine or for 5 months   after stopping it. Talk with your doctor or health care professional about your birth control options while taking this medicine. Women should inform their doctor if they wish to become pregnant or think they might be pregnant. There is a potential for serious side effects to an unborn  child. Talk to your health care professional or pharmacist for more information. What side effects may I notice from receiving this medicine? Side effects that you should report to your doctor or health care professional as soon as possible: -allergic reactions like skin rash, itching or hives, swelling of the face, lips, or tongue -bone pain -breathing problems -dizziness -jaw pain, especially after dental work -redness, blistering, peeling of the skin -signs and symptoms of infection like fever or chills; cough; sore throat; pain or trouble passing urine -signs of low calcium like fast heartbeat, muscle cramps or muscle pain; pain, tingling, numbness in the hands or feet; seizures -unusual bleeding or bruising -unusually weak or tired Side effects that usually do not require medical attention (report to your doctor or health care professional if they continue or are bothersome): -constipation -diarrhea -headache -joint pain -loss of appetite -muscle pain -runny nose -tiredness -upset stomach This list may not describe all possible side effects. Call your doctor for medical advice about side effects. You may report side effects to FDA at 1-800-FDA-1088. Where should I keep my medicine? This medicine is only given in a clinic, doctor's office, or other health care setting and will not be stored at home. NOTE: This sheet is a summary. It may not cover all possible information. If you have questions about this medicine, talk to your doctor, pharmacist, or health care provider.  2019 Elsevier/Gold Standard (2017-10-24 16:10:44) Cyanocobalamin, Vitamin B12 injection What is this medicine? CYANOCOBALAMIN (sye an oh koe BAL a min) is a man made form of vitamin B12. Vitamin B12 is used in the growth of healthy blood cells, nerve cells, and proteins in the body. It also helps with the metabolism of fats and carbohydrates. This medicine is used to treat people who can not absorb vitamin  B12. This medicine may be used for other purposes; ask your health care provider or pharmacist if you have questions. COMMON BRAND NAME(S): B-12 Compliance Kit, B-12 Injection Kit, Cyomin, LA-12, Nutri-Twelve, Physicians EZ Use B-12, Primabalt What should I tell my health care provider before I take this medicine? They need to know if you have any of these conditions: -kidney disease -Leber's disease -megaloblastic anemia -an unusual or allergic reaction to cyanocobalamin, cobalt, other medicines, foods, dyes, or preservatives -pregnant or trying to get pregnant -breast-feeding How should I use this medicine? This medicine is injected into a muscle or deeply under the skin. It is usually given by a health care professional in a clinic or doctor's office. However, your doctor may teach you how to inject yourself. Follow all instructions. Talk to your pediatrician regarding the use of this medicine in children. Special care may be needed. Overdosage: If you think you have taken too much of this medicine contact a poison control center or emergency room at once. NOTE: This medicine is only for you. Do not share this medicine with others. What if I miss a dose? If you are given your dose at a clinic or doctor's office, call to reschedule your appointment. If you give your own injections and you miss a dose, take it as soon as you can. If it is almost time for your next dose, take only that dose.  Do not take double or extra doses. What may interact with this medicine? -colchicine -heavy alcohol intake This list may not describe all possible interactions. Give your health care provider a list of all the medicines, herbs, non-prescription drugs, or dietary supplements you use. Also tell them if you smoke, drink alcohol, or use illegal drugs. Some items may interact with your medicine. What should I watch for while using this medicine? Visit your doctor or health care professional regularly. You may  need blood work done while you are taking this medicine. You may need to follow a special diet. Talk to your doctor. Limit your alcohol intake and avoid smoking to get the best benefit. What side effects may I notice from receiving this medicine? Side effects that you should report to your doctor or health care professional as soon as possible: -allergic reactions like skin rash, itching or hives, swelling of the face, lips, or tongue -blue tint to skin -chest tightness, pain -difficulty breathing, wheezing -dizziness -red, swollen painful area on the leg Side effects that usually do not require medical attention (report to your doctor or health care professional if they continue or are bothersome): -diarrhea -headache This list may not describe all possible side effects. Call your doctor for medical advice about side effects. You may report side effects to FDA at 1-800-FDA-1088. Where should I keep my medicine? Keep out of the reach of children. Store at room temperature between 15 and 30 degrees C (59 and 85 degrees F). Protect from light. Throw away any unused medicine after the expiration date. NOTE: This sheet is a summary. It may not cover all possible information. If you have questions about this medicine, talk to your doctor, pharmacist, or health care provider.  2019 Elsevier/Gold Standard (2007-09-28 22:10:20)

## 2018-10-26 NOTE — Patient Instructions (Signed)
Implanted Port Insertion, Care After  This sheet gives you information about how to care for yourself after your procedure. Your health care provider may also give you more specific instructions. If you have problems or questions, contact your health care provider.  What can I expect after the procedure?  After the procedure, it is common to have:  · Discomfort at the port insertion site.  · Bruising on the skin over the port. This should improve over 3-4 days.  Follow these instructions at home:  Port care  · After your port is placed, you will get a manufacturer's information card. The card has information about your port. Keep this card with you at all times.  · Take care of the port as told by your health care provider. Ask your health care provider if you or a family member can get training for taking care of the port at home. A home health care nurse may also take care of the port.  · Make sure to remember what type of port you have.  Incision care         · Follow instructions from your health care provider about how to take care of your port insertion site. Make sure you:  ? Wash your hands with soap and water before and after you change your bandage (dressing). If soap and water are not available, use hand sanitizer.  ? Change your dressing as told by your health care provider.  ? Leave stitches (sutures), skin glue, or adhesive strips in place. These skin closures may need to stay in place for 2 weeks or longer. If adhesive strip edges start to loosen and curl up, you may trim the loose edges. Do not remove adhesive strips completely unless your health care provider tells you to do that.  · Check your port insertion site every day for signs of infection. Check for:  ? Redness, swelling, or pain.  ? Fluid or blood.  ? Warmth.  ? Pus or a bad smell.  Activity  · Return to your normal activities as told by your health care provider. Ask your health care provider what activities are safe for you.  · Do not  lift anything that is heavier than 10 lb (4.5 kg), or the limit that you are told, until your health care provider says that it is safe.  General instructions  · Take over-the-counter and prescription medicines only as told by your health care provider.  · Do not take baths, swim, or use a hot tub until your health care provider approves. Ask your health care provider if you may take showers. You may only be allowed to take sponge baths.  · Do not drive for 24 hours if you were given a sedative during your procedure.  · Wear a medical alert bracelet in case of an emergency. This will tell any health care providers that you have a port.  · Keep all follow-up visits as told by your health care provider. This is important.  Contact a health care provider if:  · You cannot flush your port with saline as directed, or you cannot draw blood from the port.  · You have a fever or chills.  · You have redness, swelling, or pain around your port insertion site.  · You have fluid or blood coming from your port insertion site.  · Your port insertion site feels warm to the touch.  · You have pus or a bad smell coming from the port   insertion site.  Get help right away if:  · You have chest pain or shortness of breath.  · You have bleeding from your port that you cannot control.  Summary  · Take care of the port as told by your health care provider. Keep the manufacturer's information card with you at all times.  · Change your dressing as told by your health care provider.  · Contact a health care provider if you have a fever or chills or if you have redness, swelling, or pain around your port insertion site.  · Keep all follow-up visits as told by your health care provider.  This information is not intended to replace advice given to you by your health care provider. Make sure you discuss any questions you have with your health care provider.  Document Released: 04/07/2013 Document Revised: 01/13/2018 Document Reviewed:  01/13/2018  Elsevier Interactive Patient Education © 2019 [employer]

## 2018-10-29 ENCOUNTER — Telehealth: Payer: Self-pay | Admitting: Hematology & Oncology

## 2018-10-29 NOTE — Telephone Encounter (Signed)
Appointments scheduled letter/calendar mailed  °

## 2018-10-30 ENCOUNTER — Encounter: Payer: Self-pay | Admitting: *Deleted

## 2018-11-03 ENCOUNTER — Other Ambulatory Visit: Payer: Self-pay | Admitting: Family Medicine

## 2018-11-03 DIAGNOSIS — E1151 Type 2 diabetes mellitus with diabetic peripheral angiopathy without gangrene: Secondary | ICD-10-CM

## 2018-11-03 DIAGNOSIS — IMO0002 Reserved for concepts with insufficient information to code with codable children: Secondary | ICD-10-CM

## 2018-11-03 DIAGNOSIS — E1165 Type 2 diabetes mellitus with hyperglycemia: Principal | ICD-10-CM

## 2018-11-05 ENCOUNTER — Other Ambulatory Visit: Payer: Self-pay

## 2018-11-05 ENCOUNTER — Ambulatory Visit (HOSPITAL_COMMUNITY): Payer: Medicare Other | Attending: Cardiovascular Disease

## 2018-11-05 DIAGNOSIS — Z953 Presence of xenogenic heart valve: Secondary | ICD-10-CM

## 2018-11-06 ENCOUNTER — Telehealth: Payer: Self-pay | Admitting: *Deleted

## 2018-11-06 NOTE — Telephone Encounter (Signed)
PPM implanted 09/10/17. Leads should be well healed and stable at this point, but will schedule for repeat remote transmission and contact for in-clinic appointment if any issues. Scheduled for automatic transmission on 11/09/18.

## 2018-11-06 NOTE — Telephone Encounter (Signed)
-----   Message from Eileen Stanford, PA-C sent at 11/06/2018 12:37 PM EDT ----- TAVR valve functioning normally. Pacing wires in RA/RV the RA wire appears unusually mobile suggest checking thressholds. Device was interrogated last month and functioning normally, I wonder if we need to check is again now? Can you help me get it check amber/Richard [name]? Thanks!

## 2018-11-09 ENCOUNTER — Other Ambulatory Visit: Payer: Self-pay

## 2018-11-09 ENCOUNTER — Ambulatory Visit (INDEPENDENT_AMBULATORY_CARE_PROVIDER_SITE_OTHER): Payer: Medicare Other | Admitting: *Deleted

## 2018-11-09 DIAGNOSIS — R001 Bradycardia, unspecified: Secondary | ICD-10-CM

## 2018-11-09 DIAGNOSIS — I441 Atrioventricular block, second degree: Secondary | ICD-10-CM

## 2018-11-10 NOTE — Telephone Encounter (Signed)
Transmission received. Normal device function. Lead trends stable. Patient notified.   Chanetta Marshall, NP 11/10/2018 9:57 AM

## 2018-11-10 NOTE — Telephone Encounter (Signed)
Transmission not yet received-- shows up as "pending" in Carelink. Spoke with patient, walked him through [employer] update and manual transmission. Transmission pending. Instructed pt to keep app running in the background, will check back later. He is agreeable and thanked me for my call.

## 2018-11-16 LAB — CUP PACEART REMOTE DEVICE CHECK
Battery Remaining Longevity: 65 mo
Battery Voltage: 2.97 V
Brady Statistic RA Percent Paced: 0.02 %
Brady Statistic RV Percent Paced: 94.82 %
Date Time Interrogation Session: 20200512125042
Implantable Lead Implant Date: 20190313
Implantable Lead Implant Date: 20190313
Implantable Lead Location: 753859
Implantable Lead Location: 753860
Implantable Lead Model: 3830
Implantable Lead Model: 5076
Implantable Pulse Generator Implant Date: 20190313
Lead Channel Impedance Value: 266 Ohm
Lead Channel Impedance Value: 285 Ohm
Lead Channel Impedance Value: 380 Ohm
Lead Channel Impedance Value: 399 Ohm
Lead Channel Pacing Threshold Amplitude: 1.125 V
Lead Channel Pacing Threshold Pulse Width: 0.4 ms
Lead Channel Sensing Intrinsic Amplitude: 2 mV
Lead Channel Sensing Intrinsic Amplitude: 2 mV
Lead Channel Sensing Intrinsic Amplitude: 3.875 mV
Lead Channel Sensing Intrinsic Amplitude: 3.875 mV
Lead Channel Setting Pacing Amplitude: 3.5 V
Lead Channel Setting Pacing Amplitude: 3.5 V
Lead Channel Setting Pacing Pulse Width: 1 ms
Lead Channel Setting Sensing Sensitivity: 1.2 mV

## 2018-11-17 NOTE — Addendum Note (Signed)
Addended by: Douglass Rivers D on: 11/17/2018 02:44 PM   Modules accepted: Level of Service

## 2018-11-17 NOTE — Progress Notes (Signed)
Remote pacemaker transmission.   

## 2018-11-19 ENCOUNTER — Other Ambulatory Visit: Payer: Self-pay | Admitting: *Deleted

## 2018-11-19 DIAGNOSIS — M25562 Pain in left knee: Secondary | ICD-10-CM | POA: Diagnosis not present

## 2018-11-19 DIAGNOSIS — M5136 Other intervertebral disc degeneration, lumbar region: Secondary | ICD-10-CM | POA: Diagnosis not present

## 2018-11-19 NOTE — Patient Outreach (Signed)
[facility]) Care Management  11/19/2018  Jaquann Guarisco [facility] 15-Apr-1941 [id number]    RN spoke with pt today and received an update on his ongoing management of care. Pt states he has been attending all medical appointments with the orthopedic provider today for his left knee and will follow up over the next Tuesday 11/24/2018 with his primary provider for lab work on his A1c last reported in Jan (6.1).  Will discussed prediabetes is 5.7-6.1 and encouraged pt's focus on reducing his overall reading.  Discussed pt's dietary habits with bedtime snacks and pt reports his CBG in the AM around 139-140. States he is eating peanut butter on cheese it crackers.  RN discussed this is to much carbohydrates and pt should consider 1/2 sand-which instead of the peanut butter on cheese it crackers. Pt inquired on specific food items he can consume to continue to lower his CBGs. RN encourage pt to discuss possible consult with a dietitian or nutritionist next week on his appointments to obtain a more structured meal plan for his daily intake. Pt receptive and will consult with his primary provider.  Pt is aware to reduce his carbohydrates however this has been a slow progress for pt due to his lack of knowledge with food items in his regular dietary habits. Pt has made some changes related to reducing down to the ZERO gatotrade drinks however remains aware that they will still contains carbohydrates. Strongly encouraged pt to read the labels and praise pt for making slight changes in his dietary consumption but continued to encouraged consult with the dietitian.   Plan of care review along with all goals and interventions adjusted according to pt's progress. Will strongly encouraged the pending consults and inquired for a dietitian in order for pt to better manage his diabetes. Will follow up in few weeks and evaluate pt's progress with his management of care. Will also inquired on any additional needs for  community resources that may be able to assist him further with his ongoing management of care.  THN CM Care Plan Problem One     Most Recent Value  Care Plan Problem One  Diabetes AIC over 7  Role Documenting the Problem One  Care Management Coordinator  Care Plan for Problem One  Active  THN Long Term Goal   Pt will reduce his A1C and overall CBG readings over the next 90 days.  THN Long Term Goal Start Date  10/21/18  Interventions for Problem One Long Term Goal  Will extend to allow pending A1C appointment over the next few weeks. WIill continue to encouragd adherence with his dietary habits and daily CBGs. Will obtain report on readings and review the risk once again if his diabetes is not managed.   THN CM Short Term Goal #1   Pt will reduce high carb food and drink items from his diet over the next 30 days.  THN CM Short Term Goal #1 Start Date  10/21/18  Interventions for Short Term Goal #1  Will continue to discuss low carb dietary habits related to food items and allow pt consult with a dietitian and upcoming A1C lab. Will extend to allow adherence and ongoing follow ups.   THN CM Short Term Goal #2   Adherence to medical appointments and laboratory testing over the next 30 days.  THN CM Short Term Goal #2 Start Date  10/21/18  [facility] CM Short Term Goal #2 Met Date  11/19/18      Raina Mina,  RN Care Management Coordinator [place] Office [phone]

## 2018-11-24 ENCOUNTER — Other Ambulatory Visit (INDEPENDENT_AMBULATORY_CARE_PROVIDER_SITE_OTHER): Payer: Medicare Other

## 2018-11-24 ENCOUNTER — Other Ambulatory Visit: Payer: Self-pay

## 2018-11-24 DIAGNOSIS — E1151 Type 2 diabetes mellitus with diabetic peripheral angiopathy without gangrene: Secondary | ICD-10-CM | POA: Diagnosis not present

## 2018-11-24 DIAGNOSIS — I10 Essential (primary) hypertension: Secondary | ICD-10-CM | POA: Diagnosis not present

## 2018-11-24 DIAGNOSIS — E1165 Type 2 diabetes mellitus with hyperglycemia: Secondary | ICD-10-CM | POA: Diagnosis not present

## 2018-11-24 DIAGNOSIS — E785 Hyperlipidemia, unspecified: Secondary | ICD-10-CM

## 2018-11-24 DIAGNOSIS — IMO0002 Reserved for concepts with insufficient information to code with codable children: Secondary | ICD-10-CM

## 2018-11-24 LAB — COMPREHENSIVE METABOLIC PANEL
ALT: 16 U/L (ref 0–53)
AST: 16 U/L (ref 0–37)
Albumin: 4.9 g/dL (ref 3.5–5.2)
Alkaline Phosphatase: 82 U/L (ref 39–117)
BUN: 35 mg/dL — ABNORMAL HIGH (ref 6–23)
CO2: 29 mEq/L (ref 19–32)
Calcium: 10.2 mg/dL (ref 8.4–10.5)
Chloride: 100 mEq/L (ref 96–112)
Creatinine, Ser: 1.66 mg/dL — ABNORMAL HIGH (ref 0.40–1.50)
GFR: 40.02 mL/min — ABNORMAL LOW (ref >60.00)
Glucose, Bld: 159 mg/dL — ABNORMAL HIGH (ref 70–99)
Potassium: 4.3 mEq/L (ref 3.5–5.1)
Sodium: 141 mEq/L (ref 135–145)
Total Bilirubin: 0.6 mg/dL (ref 0.2–1.2)
Total Protein: 7.1 g/dL (ref 6.0–8.3)

## 2018-11-24 LAB — LIPID PANEL
Cholesterol: 151 mg/dL (ref 0–200)
HDL: 33.4 mg/dL — ABNORMAL LOW (ref >39.00)
NonHDL: 117.68
Total CHOL/HDL Ratio: 5
Triglycerides: 220 mg/dL — ABNORMAL HIGH (ref 0.0–149.0)
VLDL: 44 mg/dL — ABNORMAL HIGH (ref 0.0–40.0)

## 2018-11-24 LAB — LDL CHOLESTEROL, DIRECT: Direct LDL: 84 mg/dL

## 2018-11-24 LAB — HEMOGLOBIN A1C: Hgb A1c MFr Bld: 6.8 % — ABNORMAL HIGH (ref 4.6–6.5)

## 2018-11-25 ENCOUNTER — Other Ambulatory Visit: Payer: Self-pay | Admitting: Family Medicine

## 2018-11-25 DIAGNOSIS — E1169 Type 2 diabetes mellitus with other specified complication: Secondary | ICD-10-CM

## 2018-11-25 DIAGNOSIS — E1165 Type 2 diabetes mellitus with hyperglycemia: Secondary | ICD-10-CM

## 2018-11-25 DIAGNOSIS — E785 Hyperlipidemia, unspecified: Secondary | ICD-10-CM

## 2018-11-25 DIAGNOSIS — I10 Essential (primary) hypertension: Secondary | ICD-10-CM

## 2018-12-04 ENCOUNTER — Telehealth: Payer: Self-pay

## 2018-12-04 IMAGING — CT NM PET TUM IMG RESTAG (PS) SKULL BASE T - THIGH
1 of 7 series · 1 of 25 positions shown · non-contrast
Comparison: 3 PET-CT 04/15/2017, 817 18

CLINICAL DATA: Subsequent treatment strategy for diffuse large
B-cell lymphoma. Status post 4 cycles first-line therapy..

EXAM:
NUCLEAR MEDICINE PET SKULL BASE TO THIGH
TECHNIQUE: 12.6 mCi F-18 FDG was injected intravenously. Full-ring PET imaging
was performed from the skull base to thigh after the radiotracer. CT
data was obtained and used for attenuation correction and anatomic
localization.
Fasting blood glucose: 109 mg/dl
Mediastinal blood pool activity: SUV max

[Series 4: ct sk_thigh 5.0 hd_fov · axial · 5.0mm · 1.17mm/px · 1 of 238 slices shown]
[im 238/238  brain]
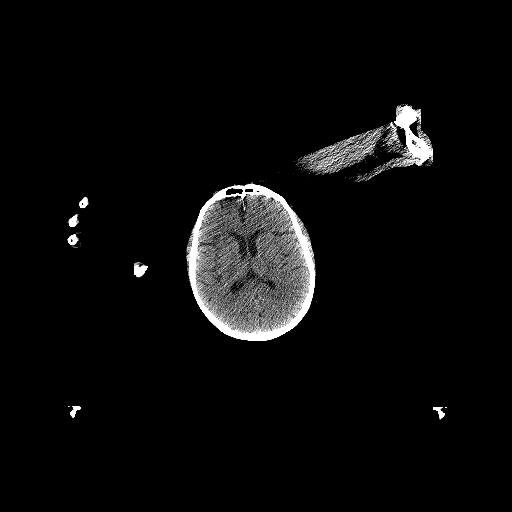

[1 of 25 positions shown; findings below may reference images not displayed]

FINDINGS: NECK: No hypermetabolic lymph nodes in the neck.

Incidental CT findings: none

CHEST: No hypermetabolic lymph nodes in the chest. No hypermetabolic
axillary lymph nodes. Seroma in the LEFT chest wall adjacent to the
axilla at lymphadenectomy site.

Incidental CT findings: Bilateral pleural effusions are small to
moderate and greater on the RIGHT. No suspicious pulmonary
nodularity. Port in the RIGHT chest wall with tip in distal SVC

ABDOMEN/PELVIS: Normal size spleen with normal metabolic activity.
No hypermetabolic abdominopelvic lymph nodes.

Incidental CT findings: Atherosclerotic calcification of the aorta..
Prostate gland normal

SKELETON: No focal hypermetabolic activity to suggest skeletal
metastasis.

Incidental CT findings: none
IMPRESSION: 1. No evidence lymphoma recurrence on skull base to mid thigh FDG
PET-CT scan.
2. Bilateral pleural effusions which are small to moderate.

## 2018-12-04 NOTE — Telephone Encounter (Signed)
Copied from [place] [id number]. Topic: General - Other >> Dec 03, 2018  8:48 AM Leward Quan A wrote: Reason for CRM: Patient would like a call back with lab results please. Ph# [phone]

## 2018-12-04 NOTE — Telephone Encounter (Signed)
Patient is calling in to see about getting a call back for lab results. Please advise.

## 2018-12-07 NOTE — Telephone Encounter (Signed)
Patient notified of lab results

## 2018-12-08 ENCOUNTER — Other Ambulatory Visit: Payer: Self-pay | Admitting: Family Medicine

## 2018-12-08 ENCOUNTER — Telehealth: Payer: Self-pay

## 2018-12-08 DIAGNOSIS — M5136 Other intervertebral disc degeneration, lumbar region: Secondary | ICD-10-CM | POA: Diagnosis not present

## 2018-12-08 DIAGNOSIS — E781 Pure hyperglyceridemia: Secondary | ICD-10-CM

## 2018-12-08 NOTE — Telephone Encounter (Signed)
I called and spoke with patient, he is ok with switching office visit to a telehealth visit. Patient requested a phone call, he does not have access to a smart phone or internet.      Virtual Visit Pre-Appointment Phone Call  "(Name), I am calling you today to discuss your upcoming appointment. We are currently trying to limit exposure to the virus that causes COVID-19 by seeing patients at home rather than in the office."  1. "What is the BEST phone number to call the day of the visit?" - include this in appointment notes  2. "Do you have or have access to (through a family member/friend) a smartphone with video capability that we can use for your visit?" a. If yes - list this number in appt notes as "cell" (if different from BEST phone #) and list the appointment type as a VIDEO visit in appointment notes b. If no - list the appointment type as a PHONE visit in appointment notes  3. Confirm consent - "In the setting of the current Covid19 crisis, you are scheduled for a (phone or video) visit with your provider on (date) at (time).  Just as we do with many in-office visits, in order for you to participate in this visit, we must obtain consent.  If you'd like, I can send this to your mychart (if signed up) or email for you to review.  Otherwise, I can obtain your verbal consent now.  All virtual visits are billed to your insurance company just like a normal visit would be.  By agreeing to a virtual visit, we'd like you to understand that the technology does not allow for your provider to perform an examination, and thus may limit your provider's ability to fully assess your condition. If your provider identifies any concerns that need to be evaluated in person, we will make arrangements to do so.  Finally, though the technology is pretty good, we cannot assure that it will always work on either your or our end, and in the setting of a video visit, we may have to convert it to a phone-only visit.  In  either situation, we cannot ensure that we have a secure connection.  Are you willing to proceed?" STAFF: Did the patient verbally acknowledge consent to telehealth visit? Document YES/NO here: YES  4. Advise patient to be prepared - "Two hours prior to your appointment, go ahead and check your blood pressure, pulse, oxygen saturation, and your weight (if you have the equipment to check those) and write them all down. When your visit starts, your provider will ask you for this information. If you have an Apple Watch or Kardia device, please plan to have heart rate information ready on the day of your appointment. Please have a pen and paper handy nearby the day of the visit as well."  5. Give patient instructions for MyChart download to smartphone OR Doximity/Doxy.me as below if video visit (depending on what platform provider is using)  6. Inform patient they will receive a phone call 15 minutes prior to their appointment time (may be from unknown caller ID) so they should be prepared to answer    TELEPHONE CALL NOTE  Richard [name] has been deemed a candidate for a follow-up tele-health visit to limit community exposure during the Covid-19 pandemic. I spoke with the patient via phone to ensure availability of phone/video source, confirm preferred email & phone number, and discuss instructions and expectations.  I reminded Richard [name] to  be prepared with any vital sign and/or heart rhythm information that could potentially be obtained via home monitoring, at the time of his visit. I reminded Richard [name] to expect a phone call prior to his visit.  Mady Haagensen, [place] 12/08/2018 10:16 AM   INSTRUCTIONS FOR DOWNLOADING THE MYCHART APP TO SMARTPHONE  - The patient must first make sure to have activated MyChart and know their login information - If Apple, go to [employer] and type in MyChart in the search bar and download the app. If Android, ask patient to go to [employer] and type in  [place] in the search bar and download the app. The app is free but as with any other app downloads, their phone may require them to verify saved payment information or Apple/Android password.  - The patient will need to then log into the app with their MyChart username and password, and select [place] as their healthcare provider to link the account. When it is time for your visit, go to the MyChart app, find appointments, and click Begin Video Visit. Be sure to Select Allow for your device to access the Microphone and Camera for your visit. You will then be connected, and your provider will be with you shortly.  **If they have any issues connecting, or need assistance please contact MyChart service desk [phone])**  **If using a computer, in order to ensure the best quality for their visit they will need to use either of the following Internet Browsers: [employer], or Google Chrome**  IF USING DOXIMITY or DOXY.ME - The patient will receive a link just prior to their visit by text.     FULL LENGTH CONSENT FOR TELE-HEALTH VISIT   I hereby voluntarily request, consent and authorize [facility] and its employed or contracted physicians, physician assistants, nurse practitioners or other licensed health care professionals (the Practitioner), to provide me with telemedicine health care services (the "Services") as deemed necessary by the treating Practitioner. I acknowledge and consent to receive the Services by the Practitioner via telemedicine. I understand that the telemedicine visit will involve communicating with the Practitioner through live audiovisual communication technology and the disclosure of certain medical information by electronic transmission. I acknowledge that I have been given the opportunity to request an in-person assessment or other available alternative prior to the telemedicine visit and am voluntarily participating in the telemedicine visit.  I  understand that I have the right to withhold or withdraw my consent to the use of telemedicine in the course of my care at any time, without affecting my right to future care or treatment, and that the Practitioner or I may terminate the telemedicine visit at any time. I understand that I have the right to inspect all information obtained and/or recorded in the course of the telemedicine visit and may receive copies of available information for a reasonable fee.  I understand that some of the potential risks of receiving the Services via telemedicine include:  [name] Delay or interruption in medical evaluation due to technological equipment failure or disruption; . Information transmitted may not be sufficient (e.g. poor resolution of images) to allow for appropriate medical decision making by the Practitioner; and/or  . In rare instances, security protocols could fail, causing a breach of personal health information.  Furthermore, I acknowledge that it is my responsibility to provide information about my medical history, conditions and care that is complete and accurate to the best of my ability. I acknowledge that  Practitioner's advice, recommendations, and/or decision may be based on factors not within their control, such as incomplete or inaccurate data provided by me or distortions of diagnostic images or specimens that may result from electronic transmissions. I understand that the practice of medicine is not an exact science and that Practitioner makes no warranties or guarantees regarding treatment outcomes. I acknowledge that I will receive a copy of this consent concurrently upon execution via email to the email address I last provided but may also request a printed copy by calling the office of [facility].    I understand that my insurance will be billed for this visit.   I have read or had this consent read to me. . I understand the contents of this consent, which adequately explains the  benefits and risks of the Services being provided via telemedicine.  . I have been provided ample opportunity to ask questions regarding this consent and the Services and have had my questions answered to my satisfaction. . I give my informed consent for the services to be provided through the use of telemedicine in my medical care  By participating in this telemedicine visit I agree to the above.

## 2018-12-18 ENCOUNTER — Other Ambulatory Visit: Payer: Self-pay

## 2018-12-18 ENCOUNTER — Encounter: Payer: Self-pay | Admitting: Internal Medicine

## 2018-12-18 ENCOUNTER — Telehealth (INDEPENDENT_AMBULATORY_CARE_PROVIDER_SITE_OTHER): Payer: Medicare Other | Admitting: Internal Medicine

## 2018-12-18 VITALS — BP 123/61 | HR 64 | Ht 72.0 in | Wt 241.0 lb

## 2018-12-18 DIAGNOSIS — I442 Atrioventricular block, complete: Secondary | ICD-10-CM

## 2018-12-18 DIAGNOSIS — I4892 Unspecified atrial flutter: Secondary | ICD-10-CM

## 2018-12-18 DIAGNOSIS — Z953 Presence of xenogenic heart valve: Secondary | ICD-10-CM

## 2018-12-18 DIAGNOSIS — I739 Peripheral vascular disease, unspecified: Secondary | ICD-10-CM

## 2018-12-18 DIAGNOSIS — Z950 Presence of cardiac pacemaker: Secondary | ICD-10-CM

## 2018-12-18 NOTE — Progress Notes (Signed)
Electrophysiology TeleHealth Note   Due to national recommendations of social distancing due to COVID 19, an audio/video telehealth visit is felt to be most appropriate for this patient at this time.  See MyChart message from today for the patient's consent to telehealth for [facility].   Date:  12/18/2018   ID:  Richard [name], DOB 1941-06-30, MRN [id number]  Location: patient's home  Provider location: [address] [place]  Evaluation Performed: Follow-up visit  PCP:  [name], DO  Cardiologist:   [facility] Electrophysiologist:  SK   Chief Complaint:  Heart block   History of Present Illness:    Richard [name] is a 78 y.o. male who presents via audio/video conferencing for a telehealth visit today. The patient did not have access to video technology/had technical difficulties with video requiring transitioning to audio format only (telephone).  All issues noted in this document were discussed and addressed.  No physical exam could be performed with this format.     Since last being seen in our clinic, the patient reports doing actually well.  He is playing golf a couple times a week and recently shot par  Denies chest pain or shortness of breath.  He does have leg discomfort of 3 varieties.  The first is tingling sounds like neuropathy.  The second is nocturnal cramping relieved by repositioning.  The third is a cramping and perhaps a tightness that is associated with walking.  He also uses a terms of "my legs get tired "and this is relieved by rest.  No nocturnal dyspnea or orthopnea.  No palpitations  On Anticoagulation;  No bleeding issues   Oncology notes reviewed.  Blood counts are stable.  The patient denies symptoms of fevers, chills, cough, or new SOB worrisome for COVID 19.    Past Medical History:  Diagnosis Date  . Anemia   . Arthritis    hips  . Axillary adenopathy 02/25/2017  . Bradycardia    a. holter monitor has demonstrated HRs in  30s and [place]   . CAD (coronary artery disease)    a. s/p CABG 2001  b.  07/28/2017 cath:   Severe three-vessel native CAD with total occlusion of LAD, ramus intermedius, first OM and RCA, patent RIMA to PDA, LIMA to LAD, sequential SVG to ramus intermedius and first OM.    [name] Chronic lower back pain   . Diffuse large B cell lymphoma ([place])   . Diverticulosis   . Esophageal stricture   . GERD (gastroesophageal reflux disease)   . History of gout   . HTN (hypertension)   . Mixed hyperlipidemia   . OSA on CPAP    with 2L O2 at night  . Pancytopenia ([place])    a. related to chemo therapy for B cell lymphoma  . Peptic stricture of esophagus   . Severe aortic stenosis   . Spinal stenosis   . Type II diabetes mellitus ([place])   . Wears dentures    partial upper    Past Surgical History:  Procedure Laterality Date  . APPENDECTOMY  ~ 1952  . BACK SURGERY    . CATARACT EXTRACTION W/PHACO Left 11/06/2016   Procedure: CATARACT EXTRACTION PHACO AND INTRAOCULAR LENS PLACEMENT (IOC);  Surgeon: Estill Cotta, MD;  Location: [facility] ORS;  Service: Ophthalmology;  Laterality: Left;  Lot # [id number] H [place]: 01:09.4 AP%:25.2 CDE: 30.64  . CATARACT EXTRACTION W/PHACO Right 12/04/2016   Procedure: CATARACT EXTRACTION PHACO AND INTRAOCULAR  LENS PLACEMENT (IOC);  Surgeon: Estill Cotta, MD;  Location: [facility] ORS;  Service: Ophthalmology;  Laterality: Right;  [place] 1:25.9 AP% 24.1 CDE 39.10 Fluid Pack lot # [id number]  . COLONOSCOPY W/ BIOPSIES AND POLYPECTOMY  2013  . CORONARY ANGIOPLASTY  1993  . CORONARY ANGIOPLASTY WITH STENT PLACEMENT  05/1997   "1"  . CORONARY ARTERY BYPASS GRAFT  03/2000   "CABG X5"  . ECTROPION REPAIR Right 09/01/2018   Procedure: REPAIR OF ECTROPION BILATERAL upper and lower;  Surgeon: Karle Starch, MD;  Location: [place];  Service: Ophthalmology;  Laterality: Right;  Diabetic - oral meds sleep apnea  . ESOPHAGEAL DILATION  X 3-4   Dr. Lyla Son; "last one was in the  1990's"  . ESOPHAGOGASTRODUODENOSCOPY     multiple  . FLEXIBLE SIGMOIDOSCOPY     multiple  . HYDRADENITIS EXCISION Left 02/25/2017   Procedure: EXCISION DEEP LEFT AXILLARY LYMPH NODE;  Surgeon: Fanny Skates, MD;  Location: [place];  Service: General;  Laterality: Left;  . INTRAOPERATIVE TRANSTHORACIC ECHOCARDIOGRAM N/A 09/09/2017   Procedure: INTRAOPERATIVE TRANSTHORACIC ECHOCARDIOGRAM;  Surgeon: Burnell Blanks, MD;  Location: [place];  Service: Open Heart Surgery;  Laterality: N/A;  . KNEE ARTHROSCOPY Left 2011   meniscus repair  . LEFT HEART CATHETERIZATION WITH CORONARY/GRAFT ANGIOGRAM N/A 03/16/2014   Procedure: LEFT HEART CATHETERIZATION WITH Beatrix Fetters;  Surgeon: Burnell Blanks, MD;  Location: [facility] CATH LAB;  Service: Cardiovascular;  Laterality: N/A;  . LUMBAR LAMINECTOMY/DECOMPRESSION MICRODISCECTOMY Right 06/17/2013   Procedure: LUMBAR LAMINECTOMY MICRODISCECTOMY L4-L5 RIGHT EXCISION OF SYNOVIAL CYST RIGHT   (1 LEVEL) RIGHT PARTIAL FACETECTOMY;  Surgeon: Tobi Bastos, MD;  Location: [facility];  Service: Orthopedics;  Laterality: Right;  . MYELOGRAM  04/06/13   lumbar, Dr Gladstone Lighter  . ORBITAL LESION EXCISION Right 09/01/2018   Procedure: ORBITOTOMY WITHOUT BONE FLAP WITH REMOVAL OF LESION RIGHT;  Surgeon: Karle Starch, MD;  Location: [place];  Service: Ophthalmology;  Laterality: Right;  . PACEMAKER IMPLANT N/A 09/10/2017   Procedure: PACEMAKER IMPLANT;  Surgeon: Deboraha Sprang, MD;  Location: [place] CV LAB;  Service: Cardiovascular;  Laterality: N/A;  . PANENDOSCOPY    . PORTACATH PLACEMENT N/A 03/06/2017   Procedure: INSERTION PORT-A-CATH AND ASPIRATE SEROMA LEFT AXILLA;  Surgeon: Fanny Skates, MD;  Location: [place];  Service: General;  Laterality: N/A;  . RIGHT/LEFT HEART CATH AND CORONARY/GRAFT ANGIOGRAPHY N/A 07/28/2017   Procedure: RIGHT/LEFT HEART CATH AND CORONARY/GRAFT ANGIOGRAPHY;  Surgeon: Sherren Mocha, MD;  Location: [facility] CV  LAB;  Service: Cardiovascular;  Laterality: N/A;  . SHOULDER SURGERY Right 08/2010   screws placed; "tendons tore off"  . SKIN CANCER EXCISION Right    "neck"  . TONSILLECTOMY  ~ 1954  . TRANSCATHETER AORTIC VALVE REPLACEMENT, TRANSFEMORAL N/A 09/09/2017   Procedure: TRANSCATHETER AORTIC VALVE REPLACEMENT, TRANSFEMORAL;  Surgeon: Burnell Blanks, MD;  Location: [place];  Service: Open Heart Surgery;  Laterality: N/A;  . UPPER GI ENDOSCOPY  2013   Gastritis; Dr Carlean Purl  . VASECTOMY      Current Outpatient Medications  Medication Sig Dispense Refill  . allopurinol (ZYLOPRIM) 300 MG tablet Take 450 mg by mouth daily.     [name] amLODipine (NORVASC) 5 MG tablet Take 1 tablet BID 180 tablet 3  . atorvastatin (LIPITOR) 80 MG tablet TAKE ONE TABLET BY MOUTH AT BEDTIME 90 tablet 3  . azelastine (ASTELIN) 0.1 % nasal spray Place 2 sprays into both nostrils at bedtime as needed  for rhinitis. Use in each nostril as directed 30 mL 3  . ELIQUIS 5 MG TABS tablet TAKE 1 TABLET BY MOUTH TWICE A DAY 180 tablet 1  . esomeprazole (NEXIUM) 40 MG capsule Take 1 capsule (40 mg total) by mouth daily. 30 capsule 5  . ezetimibe (ZETIA) 10 MG tablet TAKE 1 TABLET BY MOUTH EVERY DAY 90 tablet 3  . fenofibrate 160 MG tablet Take 1 tablet (160 mg total) by mouth daily. 90 tablet 3  . folic acid (FOLVITE) 1 MG tablet Take 2 tablets (2 mg total) by mouth daily. 60 tablet 12  . furosemide (LASIX) 40 MG tablet Take 3 tablets (120 mg total) by mouth 2 (two) times daily. 180 tablet 7  . glimepiride (AMARYL) 2 MG tablet TAKE 1 TABLET BY MOUTH DAILY WITH BREAKFAST. NEED OV/LAB WORK 90 tablet 1  . glucose blood (FREESTYLE TEST STRIPS) test strip TEST BLOOD SUGAR TWICE A DAY. DUE FOR FOLLOW UP VISIT 100 each 0  . Lancets (FREESTYLE) lancets USE TWICE A DAY TO CHECK BLOOD SUGAR. DX E11.9 100 each 6  . metoprolol tartrate (LOPRESSOR) 25 MG tablet TAKE ONE-HALF TABLET BY MOUTH TWICE DAILY 90 tablet 3  . nitroGLYCERIN (NITROSTAT)  0.4 MG SL tablet PLACE 1 TABLET (0.4 MG TOTAL) UNDER THE TONGUE EVERY 5 (FIVE) MINUTES AS NEEDED FOR CHEST PAIN. 25 tablet 3  . polycarbophil (FIBERCON) 625 MG tablet Take 625 mg by mouth daily.    . potassium chloride SA (KLOR-CON M20) 20 MEQ tablet Take 2 tablets (40 mEq total) by mouth 2 (two) times daily. 120 tablet 7  . pregabalin (LYRICA) 75 MG capsule Take 1 po q am , 1 po midday and 2 po qhs 360 capsule 1  . psyllium (METAMUCIL) 58.6 % powder Take 1 packet by mouth 3 (three) times daily.    [name] VASCEPA 1 g CAPS TAKE 2 CAPSULES BY MOUTH TWICE A DAY 120 capsule 3  . metFORMIN (GLUCOPHAGE) 1000 MG tablet TAKE 1 & 1/2 TABLETS BY MOUTH EVERY MORNING AND 1 TABLET IN THE EVENING. 225 tablet 1   No current facility-administered medications for this visit.    Facility-Administered Medications Ordered in Other Visits  Medication Dose Route Frequency Provider Last Rate Last Dose  . sodium chloride flush (NS) 0.9 % injection 10 mL  10 mL Intravenous PRN [name], MD   10 mL at 05/30/17 0920    Allergies:   Benazepril, Hctz [hydrochlorothiazide], Aspirin, and Lactose intolerance (gi)   Social History:  The patient  reports that he has never smoked. He has never used smokeless tobacco. He reports that he does not drink alcohol or use drugs.   Family History:  The patient's   family history includes Diabetes in his maternal grandmother; Heart attack in his paternal grandmother; Hypertension in his father; Pancreatic cancer in his mother; Stroke in his father and maternal grandmother.   ROS:  Please see the history of present illness.   All other systems are personally reviewed and negative.    Exam:    Vital Signs:  BP 123/61   Pulse 64   Ht 6' (1.829 m)   Wt 241 lb (109.3 kg)   SpO2 95%   BMI 32.69 kg/m     Labs/Other Tests and Data Reviewed:    Recent Labs: 07/27/2018: TSH 2.450 10/26/2018: Hemoglobin 12.0; Platelet Count 191 11/24/2018: ALT 16; BUN 35; Creatinine, Ser 1.66;  Potassium 4.3; Sodium 141   Wt Readings from Last 3 Encounters:  12/18/18 241 lb (109.3 kg)  10/26/18 251 lb 1.3 oz (113.9 kg)  09/01/18 258 lb (117 kg)     Other studies personally reviewed: Additional studies/ records that were reviewed today include:As above  Review of the above records today demonstrates:Q about atrial lead off of echo Prior radiographs 6/19 atrial lead position normal   *  Last device remote is reviewed from [facility] PDF dated 5/20 which reveals normal device function,   arrhythmias -AF permanent    ASSESSMENT & PLAN:    Aortic stenosis status post TAVR   Atrial Fibrillation permanent   Pacemaker -Medtronic His bundle   Intemittent complete heart block  CAD s/p CABG  OSA on CPAP  Non Hodgkins Lymphoma Diffuse  chemos Cx 2019   Anemia Pernicious   Leg Discomfort  Doing exceptionally well  Without symptoms of ischemia  Blood work impressively normal with resolution  May have claudication.  He is to see Dr. Lenore Cordia 9/20.  Will alert him as to this possibility.  Given his age, and the non-limiting symptoms, I am not sure how aggressive to be in diagnosis and/or treatment  COVID 19 screen The patient denies symptoms of COVID 19 at this time.  The importance of social distancing was discussed today.  Follow-up:  3/21 Next remote: As Scheduled  Current medicines are reviewed at length with the patient today.   The patient does not have concerns regarding his medicines.  The following changes were made today:  none  Labs/ tests ordered today include:   No orders of the defined types were placed in this encounter.   Future tests ( post COVID )     Patient Risk:  after full review of this patients clinical status, I feel that they are at moderate risk at this time.  Today, I have spent  8 minutes with the patient with telehealth technology discussing the above.  Signed, [name], MD  12/18/2018 11:23 AM     [place] [address] [phone] (office) [phone] (fax)

## 2018-12-20 NOTE — Progress Notes (Signed)
Thanks

## 2018-12-21 ENCOUNTER — Other Ambulatory Visit: Payer: Self-pay | Admitting: *Deleted

## 2018-12-21 NOTE — Patient Outreach (Signed)
[facility] [facility]) Care Management  12/21/2018  Richard [name] [facility] Nov 24, 1940 [id number]   RN attempted telephone assessment today with both number provided. RN requested to call 1st [phone] and then another contact [phone] however only able to leave a HIPAA approved voice message to both numbers requesting a call back. Will further update plan of care and inquire on pt's ongoing management of care concerning his diabetes at that time.   Will schedule another follow up call over the next week.  Raina Mina, RN Care Management Coordinator [place] Office [phone]

## 2018-12-28 ENCOUNTER — Other Ambulatory Visit: Payer: Self-pay | Admitting: Cardiology

## 2018-12-28 ENCOUNTER — Other Ambulatory Visit: Payer: Self-pay

## 2018-12-28 MED ORDER — AMLODIPINE BESYLATE 5 MG PO TABS: ORAL_TABLET | ORAL | 1 refills | Status: DC

## 2018-12-28 NOTE — Patient Outreach (Addendum)
[facility]) Care Management  12/28/2018  Richard [name] [facility] 10-25-1940 [id number]  Unsuccessful Outreach Call x2  Called member at preferred number and no answer. HIPPA compliant voicemail message left introducing self and requested return call. Called member's secondary number and no answer. HIPPA compliant voicemail message left introducing self and requested return call.  Unsuccessful Outreach letter sent. RN CM will return call to member within 4 business days.   Richard Mola "ANN" Josiah Lobo, RN-BSN  [facility] Care Management  Community Care Management Coordinator  [phone] [email or web address]

## 2018-12-30 ENCOUNTER — Other Ambulatory Visit: Payer: Self-pay

## 2018-12-30 NOTE — Patient Outreach (Signed)
[facility] [place] [facility]) Care Management  12/30/2018  Richard [name] [facility] October 14, 1940 [id number]  Received return call from member and member stated he could not hear on the phone. Returned call to member and introduced self and stated reason for the call. 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 Attempted to explain that call was on behalf of [facility] Coordinator, Richard [name] to speak to him and not concerning his wife. Asked member to verify his birthday and member stated his birthday is "[birth year]".  Member stated that information "is confusing" please have Richard [name] to call me".   Will send updated information to Richard [name], [facility] RN DM for return call to member next week.  [name], RN-BSN  [facility] Care Management  Community Care Management Coordinator  [phone] [email or web address]

## 2018-12-31 ENCOUNTER — Other Ambulatory Visit: Payer: Self-pay | Admitting: Internal Medicine

## 2019-01-05 ENCOUNTER — Other Ambulatory Visit: Payer: Self-pay | Admitting: *Deleted

## 2019-01-05 NOTE — Patient Outreach (Signed)
[facility]) Care Management  01/05/2019  [place] 01-23-1941 [id number]   Telephone Assessment  RN contacted pt today and was present a different name and DOB. Pt was able to verify the contact number today was correct [phone] H). Pt explained a consult with this RN CM back in April for his wife but denies any active services with [facility] at this time. RN attempted to get additional information like his provider however pt also denies the noted provider. RN has received past information that pt may have cognitive issues and there was a possibility of seeking memory unit placement. Due to the past receptive assessment with this pt RN has placed a call to the daughter Jayleen Afonso [phone].  Will await a call back if no response will follow up in a few days to verify ongoing [facility] services.  Raina Mina, RN Care Management Coordinator [place] Office [phone]

## 2019-01-06 ENCOUNTER — Other Ambulatory Visit: Payer: Self-pay | Admitting: *Deleted

## 2019-01-06 NOTE — Patient Outreach (Addendum)
[facility]) Care Management  01/05/2019  Richard [name] Peter Garter 03-09-1941 [id number]    RN received a call back from the number noted for pt's daughter Arrie Aran [place]) however number no longer belongs to this person. Number belongs to someone else and she has had this number for the last 3 months with no calls for the above person. RN attempted to contact pt's provider's office (Dr. Carollee Herter) several times to verify contacts however unsuccessful. Based upon pt's irritation and adamant about not being the person as noted but he was able to verify the correct contact number [phone]. This pt no longer wishes to received call via [facility] services at this time.  No further contacts will take place at this time based upon this person's response. . Will close this case with non-active status until notified once again with credentials. Will also alert primary provider of pt's decline for ongoing services.   Raina Mina, RN Care Management Coordinator [place] [facility] [phone]

## 2019-01-07 ENCOUNTER — Encounter: Payer: Medicare Other | Admitting: *Deleted

## 2019-01-08 ENCOUNTER — Telehealth: Payer: Self-pay | Admitting: Physician Assistant

## 2019-01-08 ENCOUNTER — Telehealth: Payer: Self-pay

## 2019-01-08 NOTE — Telephone Encounter (Signed)
Advised patient needs appointment with Kathlene November PA for TAVR 1 year follow up Will forward to [place] RN reach out to patient to schedule

## 2019-01-08 NOTE — Telephone Encounter (Signed)
Left message for patient to remind of missed remote transmission.  

## 2019-01-08 NOTE — Telephone Encounter (Signed)
°  Patient would like clarification as to why he needs to follow up with Richard [name]  When he has an appt scheduled with Dr. Percival Spanish on 09/01

## 2019-01-11 NOTE — Telephone Encounter (Signed)
Hello! Richard [name] is on vacation. Can you pease let the patient know that we did a telephone visit in early March that counts for his 1 year evaluation. Patient's are inappropriately getting one year recalls. No other appointment is necessary.  Thanks,  KT

## 2019-01-11 NOTE — Telephone Encounter (Signed)
Advised patient, verbalized understanding  

## 2019-01-14 ENCOUNTER — Other Ambulatory Visit: Payer: Self-pay | Admitting: Family Medicine

## 2019-01-14 DIAGNOSIS — E1151 Type 2 diabetes mellitus with diabetic peripheral angiopathy without gangrene: Secondary | ICD-10-CM

## 2019-01-14 DIAGNOSIS — IMO0002 Reserved for concepts with insufficient information to code with codable children: Secondary | ICD-10-CM

## 2019-01-20 ENCOUNTER — Ambulatory Visit (INDEPENDENT_AMBULATORY_CARE_PROVIDER_SITE_OTHER): Payer: Medicare Other | Admitting: *Deleted

## 2019-01-20 DIAGNOSIS — I480 Paroxysmal atrial fibrillation: Secondary | ICD-10-CM

## 2019-01-20 DIAGNOSIS — I442 Atrioventricular block, complete: Secondary | ICD-10-CM

## 2019-01-21 ENCOUNTER — Other Ambulatory Visit: Payer: Self-pay | Admitting: Hematology & Oncology

## 2019-01-21 LAB — CUP PACEART REMOTE DEVICE CHECK
Battery Remaining Longevity: 64 mo
Battery Voltage: 2.97 V
Brady Statistic RA Percent Paced: 0.02 %
Brady Statistic RV Percent Paced: 96.82 %
Date Time Interrogation Session: 20200721125038
Implantable Lead Implant Date: 20190313
Implantable Lead Implant Date: 20190313
Implantable Lead Location: 753859
Implantable Lead Location: 753860
Implantable Lead Model: 3830
Implantable Lead Model: 5076
Implantable Pulse Generator Implant Date: 20190313
Lead Channel Impedance Value: 285 Ohm
Lead Channel Impedance Value: 304 Ohm
Lead Channel Impedance Value: 399 Ohm
Lead Channel Impedance Value: 418 Ohm
Lead Channel Pacing Threshold Amplitude: 1 V
Lead Channel Pacing Threshold Pulse Width: 0.4 ms
Lead Channel Sensing Intrinsic Amplitude: 2.375 mV
Lead Channel Sensing Intrinsic Amplitude: 2.375 mV
Lead Channel Sensing Intrinsic Amplitude: 3.875 mV
Lead Channel Sensing Intrinsic Amplitude: 3.875 mV
Lead Channel Setting Pacing Amplitude: 3.5 V
Lead Channel Setting Pacing Amplitude: 3.5 V
Lead Channel Setting Pacing Pulse Width: 1 ms
Lead Channel Setting Sensing Sensitivity: 1.2 mV

## 2019-01-22 ENCOUNTER — Other Ambulatory Visit: Payer: Self-pay | Admitting: Family Medicine

## 2019-01-22 DIAGNOSIS — E1140 Type 2 diabetes mellitus with diabetic neuropathy, unspecified: Secondary | ICD-10-CM

## 2019-01-25 ENCOUNTER — Inpatient Hospital Stay: Payer: Medicare Other | Attending: Hematology & Oncology

## 2019-01-25 ENCOUNTER — Other Ambulatory Visit: Payer: Self-pay | Admitting: Cardiology

## 2019-01-25 ENCOUNTER — Other Ambulatory Visit: Payer: Self-pay

## 2019-01-25 VITALS — BP 136/62 | HR 68 | Temp 98.6°F | Resp 16

## 2019-01-25 DIAGNOSIS — C8330 Diffuse large B-cell lymphoma, unspecified site: Secondary | ICD-10-CM | POA: Diagnosis not present

## 2019-01-25 DIAGNOSIS — D508 Other iron deficiency anemias: Secondary | ICD-10-CM

## 2019-01-25 DIAGNOSIS — D510 Vitamin B12 deficiency anemia due to intrinsic factor deficiency: Secondary | ICD-10-CM | POA: Insufficient documentation

## 2019-01-25 DIAGNOSIS — D519 Vitamin B12 deficiency anemia, unspecified: Secondary | ICD-10-CM

## 2019-01-25 DIAGNOSIS — Z79899 Other long term (current) drug therapy: Secondary | ICD-10-CM | POA: Insufficient documentation

## 2019-01-25 DIAGNOSIS — E611 Iron deficiency: Secondary | ICD-10-CM | POA: Insufficient documentation

## 2019-01-25 MED ORDER — CYANOCOBALAMIN 1000 MCG/ML IJ SOLN
1000.0000 ug | Freq: Once | INTRAMUSCULAR | Status: AC
  Administered 2019-01-25: 1000 ug via INTRAMUSCULAR

## 2019-01-25 MED ORDER — CYANOCOBALAMIN 1000 MCG/ML IJ SOLN
INTRAMUSCULAR | Status: AC
  Filled 2019-01-25: qty 1

## 2019-01-25 NOTE — Telephone Encounter (Signed)
Pt came in office stating is needing the rx sent to pharmacy by today or tomorrow, since pt took last pill today and is needing the medication by tomorrow. Please advise ASAP

## 2019-01-25 NOTE — Patient Instructions (Signed)

## 2019-01-26 ENCOUNTER — Other Ambulatory Visit: Payer: Self-pay | Admitting: Family Medicine

## 2019-01-26 ENCOUNTER — Telehealth: Payer: Self-pay | Admitting: *Deleted

## 2019-01-26 ENCOUNTER — Other Ambulatory Visit: Payer: Self-pay | Admitting: Cardiology

## 2019-01-26 DIAGNOSIS — E1140 Type 2 diabetes mellitus with diabetic neuropathy, unspecified: Secondary | ICD-10-CM

## 2019-01-26 MED ORDER — PREGABALIN 75 MG PO CAPS: ORAL_CAPSULE | ORAL | 1 refills | Status: DC

## 2019-01-26 NOTE — Telephone Encounter (Signed)
Erin from [facility] called and stated that insurance for Lyrica will in cover 3 capsules a day.  Would you like to change rx or call for precert?

## 2019-01-26 NOTE — Telephone Encounter (Signed)
I believe he has been on that dose for a long time

## 2019-01-26 NOTE — Telephone Encounter (Signed)
Requesting: Lyrica Contract: 10/15/2012 UDS: N/A Last OV: 08/21/2018 Next OV: N/A Last Refill: 05/12/2018, #[id number] RF Database:   Please advise

## 2019-01-27 MED ORDER — PREGABALIN 75 MG PO CAPS: ORAL_CAPSULE | ORAL | 1 refills | Status: DC

## 2019-01-27 MED ORDER — PREGABALIN 150 MG PO CAPS: 150.0000 mg | ORAL_CAPSULE | Freq: Every day | ORAL | 1 refills | Status: DC

## 2019-01-27 NOTE — Telephone Encounter (Signed)
Just want to clarify.  Do you want 1 cap tid or what you wrote for which is the 1 cap am, 1 cap pm, and 1 at bedtime?

## 2019-01-27 NOTE — Telephone Encounter (Signed)
Spoke with pharmacist in regards to medication.  He usually just put it as Lyrica 75mg  1 po am and 1 po midday and does another prescription for Lyrica 150 1 at bedtime.  Medication profile has been updated to not have any questions next refill. Further refills we need to send in the two rxs for 75mg  and 150mg .

## 2019-01-28 ENCOUNTER — Other Ambulatory Visit: Payer: Self-pay | Admitting: Cardiology

## 2019-02-02 ENCOUNTER — Ambulatory Visit: Payer: Medicare Other | Admitting: *Deleted

## 2019-02-03 NOTE — Progress Notes (Signed)
Remote pacemaker transmission.   

## 2019-02-06 ENCOUNTER — Other Ambulatory Visit: Payer: Self-pay | Admitting: Family Medicine

## 2019-02-22 ENCOUNTER — Other Ambulatory Visit: Payer: Self-pay | Admitting: Cardiology

## 2019-02-22 NOTE — Telephone Encounter (Signed)
LMOM for patient to return call.  Turned 78 this year and with SCr at 1.66 will now need 2.5 mg dose.

## 2019-02-22 NOTE — Telephone Encounter (Signed)
33m 113.9kg Scr 1.66 Lovw/klein 12/18/18 Pt requesting 5mg  eliquis but needs 2.5 due to age and scr pharmd review

## 2019-02-24 ENCOUNTER — Other Ambulatory Visit: Payer: Self-pay | Admitting: Pharmacist Clinician (PhC)/ Clinical Pharmacy Specialist

## 2019-02-24 ENCOUNTER — Other Ambulatory Visit: Payer: Self-pay

## 2019-02-24 MED ORDER — APIXABAN 2.5 MG PO TABS: 2.5000 mg | ORAL_TABLET | Freq: Two times a day (BID) | ORAL | 1 refills | Status: DC

## 2019-02-24 NOTE — Telephone Encounter (Signed)
Dose decrease on Eliquis from 5 mg bid to 2.5 mg bid.  Pt aware of change

## 2019-02-25 ENCOUNTER — Encounter: Payer: Self-pay | Admitting: Family Medicine

## 2019-02-25 ENCOUNTER — Ambulatory Visit (INDEPENDENT_AMBULATORY_CARE_PROVIDER_SITE_OTHER): Payer: Medicare Other | Admitting: Family Medicine

## 2019-02-25 VITALS — BP 120/60 | HR 65 | Temp 97.6°F | Resp 18 | Ht 72.0 in | Wt 252.6 lb

## 2019-02-25 DIAGNOSIS — I10 Essential (primary) hypertension: Secondary | ICD-10-CM

## 2019-02-25 DIAGNOSIS — E785 Hyperlipidemia, unspecified: Secondary | ICD-10-CM

## 2019-02-25 DIAGNOSIS — M79605 Pain in left leg: Secondary | ICD-10-CM

## 2019-02-25 DIAGNOSIS — M79604 Pain in right leg: Secondary | ICD-10-CM

## 2019-02-25 DIAGNOSIS — E1165 Type 2 diabetes mellitus with hyperglycemia: Secondary | ICD-10-CM

## 2019-02-25 DIAGNOSIS — E1151 Type 2 diabetes mellitus with diabetic peripheral angiopathy without gangrene: Secondary | ICD-10-CM

## 2019-02-25 DIAGNOSIS — IMO0002 Reserved for concepts with insufficient information to code with codable children: Secondary | ICD-10-CM

## 2019-02-25 MED ORDER — FREESTYLE LITE TEST VI STRP: ORAL_STRIP | 1 refills | Status: DC

## 2019-02-25 NOTE — Assessment & Plan Note (Signed)
hgba1 to be checked , minimize simple carbs. Increase exercise as tolerated. Continue current meds

## 2019-02-25 NOTE — Patient Instructions (Signed)
Carbohydrate Counting for Diabetes Mellitus, Adult  Carbohydrate counting is a method of keeping track of how many carbohydrates you eat. Eating carbohydrates naturally increases the amount of sugar (glucose) in the blood. Counting how many carbohydrates you eat helps keep your blood glucose within normal limits, which helps you manage your diabetes (diabetes mellitus). It is important to know how many carbohydrates you can safely have in each meal. This is different for every person. A diet and nutrition specialist (registered dietitian) can help you make a meal plan and calculate how many carbohydrates you should have at each meal and snack. Carbohydrates are found in the following foods:  Grains, such as breads and cereals.  Dried beans and soy products.  Starchy vegetables, such as potatoes, peas, and corn.  Fruit and fruit juices.  Milk and yogurt.  Sweets and snack foods, such as cake, cookies, candy, chips, and soft drinks. How do I count carbohydrates? There are two ways to count carbohydrates in food. You can use either of the methods or a combination of both. Reading "Nutrition Facts" on packaged food The "Nutrition Facts" list is included on the labels of almost all packaged foods and beverages in the [place] It includes:  The serving size.  Information about nutrients in each serving, including the grams (g) of carbohydrate per serving. To use the "Nutrition Facts":  Decide how many servings you will have.  Multiply the number of servings by the number of carbohydrates per serving.  The resulting number is the total amount of carbohydrates that you will be having. Learning standard serving sizes of other foods When you eat carbohydrate foods that are not packaged or do not include "Nutrition Facts" on the label, you need to measure the servings in order to count the amount of carbohydrates:  Measure the foods that you will eat with a food scale or measuring cup, if needed.   Decide how many standard-size servings you will eat.  Multiply the number of servings by 15. Most carbohydrate-rich foods have about 15 g of carbohydrates per serving. ? For example, if you eat 8 oz (170 g) of strawberries, you will have eaten 2 servings and 30 g of carbohydrates (2 servings x 15 g = 30 g).  For foods that have more than one food mixed, such as soups and casseroles, you must count the carbohydrates in each food that is included. The following list contains standard serving sizes of common carbohydrate-rich foods. Each of these servings has about 15 g of carbohydrates:   hamburger bun or  English muffin.   oz (15 mL) syrup.   oz (14 g) jelly.  1 slice of bread.  1 six-inch tortilla.  3 oz (85 g) cooked rice or pasta.  4 oz (113 g) cooked dried beans.  4 oz (113 g) starchy vegetable, such as peas, corn, or potatoes.  4 oz (113 g) hot cereal.  4 oz (113 g) mashed potatoes or  of a large baked potato.  4 oz (113 g) canned or frozen fruit.  4 oz (120 mL) fruit juice.  4-6 crackers.  6 chicken nuggets.  6 oz (170 g) unsweetened dry cereal.  6 oz (170 g) plain fat-free yogurt or yogurt sweetened with artificial sweeteners.  8 oz (240 mL) milk.  8 oz (170 g) fresh fruit or one small piece of fruit.  24 oz (680 g) popped popcorn. Example of carbohydrate counting Sample meal  3 oz (85 g) chicken breast.  6 oz (170 g)   brown rice.  4 oz (113 g) corn.  8 oz (240 mL) milk.  8 oz (170 g) strawberries with sugar-free whipped topping. Carbohydrate calculation 1. Identify the foods that contain carbohydrates: ? Rice. ? Corn. ? Milk. ? Strawberries. 2. Calculate how many servings you have of each food: ? 2 servings rice. ? 1 serving corn. ? 1 serving milk. ? 1 serving strawberries. 3. Multiply each number of servings by 15 g: ? 2 servings rice x 15 g = 30 g. ? 1 serving corn x 15 g = 15 g. ? 1 serving milk x 15 g = 15 g. ? 1 serving  strawberries x 15 g = 15 g. 4. Add together all of the amounts to find the total grams of carbohydrates eaten: ? 30 g + 15 g + 15 g + 15 g = 75 g of carbohydrates total. Summary  Carbohydrate counting is a method of keeping track of how many carbohydrates you eat.  Eating carbohydrates naturally increases the amount of sugar (glucose) in the blood.  Counting how many carbohydrates you eat helps keep your blood glucose within normal limits, which helps you manage your diabetes.  A diet and nutrition specialist (registered dietitian) can help you make a meal plan and calculate how many carbohydrates you should have at each meal and snack. This information is not intended to replace advice given to you by your health care provider. Make sure you discuss any questions you have with your health care provider. Document Released: 06/17/2005 Document Revised: 01/09/2017 Document Reviewed: 11/29/2015 Elsevier Patient Education  2020 Elsevier Inc.  

## 2019-02-25 NOTE — Assessment & Plan Note (Signed)
Well controlled, no changes to meds. Encouraged heart healthy diet such as the DASH diet and exercise as tolerated.  °

## 2019-02-25 NOTE — Assessment & Plan Note (Signed)
Tolerating statin, encouraged heart healthy diet, avoid trans fats, minimize simple carbs and saturated fats. Increase exercise as tolerated 

## 2019-02-25 NOTE — Progress Notes (Signed)
Patient ID: Richard [name], male    DOB: 02/16/1941  Age: 78 y.o. MRN: [id number]    Subjective:  Subjective  HPI Richard [name] presents for f/u dm , chol and bp   Pt with no new complaints  Review of Systems  Constitutional: Negative.   HENT: Negative for congestion, ear pain, hearing loss, nosebleeds, postnasal drip, rhinorrhea, sinus pressure, sneezing and tinnitus.   Eyes: Negative for photophobia, discharge, itching and visual disturbance.  Respiratory: Negative.   Cardiovascular: Negative.   Gastrointestinal: Negative for abdominal distention, abdominal pain, anal bleeding, blood in stool and constipation.  Endocrine: Negative.   Genitourinary: Negative.   Musculoskeletal: Negative.   Skin: Negative.   Allergic/Immunologic: Negative.   Neurological: Negative for dizziness, weakness, light-headedness, numbness and headaches.  Psychiatric/Behavioral: Negative for agitation, confusion, decreased concentration, dysphoric mood, sleep disturbance and suicidal ideas. The patient is not nervous/anxious.     History Past Medical History:  Diagnosis Date  . Anemia   . Arthritis    hips  . Axillary adenopathy 02/25/2017  . Bradycardia    a. holter monitor has demonstrated HRs in 30s and [place]   . CAD (coronary artery disease)    a. s/p CABG 2001  b.  07/28/2017 cath:   Severe three-vessel native CAD with total occlusion of LAD, ramus intermedius, first OM and RCA, patent RIMA to PDA, LIMA to LAD, sequential SVG to ramus intermedius and first OM.    [name] Chronic lower back pain   . Diffuse large B cell lymphoma ([place])   . Diverticulosis   . Esophageal stricture   . GERD (gastroesophageal reflux disease)   . History of gout   . HTN (hypertension)   . Mixed hyperlipidemia   . OSA on CPAP    with 2L O2 at night  . Pancytopenia ([place])    a. related to chemo therapy for B cell lymphoma  . Peptic stricture of esophagus   . Severe aortic stenosis   . Spinal stenosis   . Type II  diabetes mellitus ([place])   . Wears dentures    partial upper    He has a past surgical history that includes Vasectomy; Coronary artery bypass graft (03/2000); Esophageal dilation (X 3-4); Esophagogastroduodenoscopy; Flexible sigmoidoscopy; Panendoscopy; Knee arthroscopy (Left, 2011); Shoulder surgery (Right, 08/2010); Colonoscopy w/ biopsies and polypectomy (2013); Upper gi endoscopy (2013); Myelogram (04/06/13); Lumbar laminectomy/decompression microdiscectomy (Right, 06/17/2013); Back surgery; Appendectomy (~ 1952); Tonsillectomy (~ 1954); Coronary angioplasty (1993); Coronary angioplasty with stent (05/1997); Skin cancer excision (Right); left heart catheterization with coronary/graft angiogram (N/A, 03/16/2014); Cataract extraction w/PHACO (Left, 11/06/2016); Cataract extraction w/PHACO (Right, 12/04/2016); Hydradenitis excision (Left, 02/25/2017); Portacath placement (N/A, 03/06/2017); RIGHT/LEFT HEART CATH AND CORONARY/GRAFT ANGIOGRAPHY (N/A, 07/28/2017); Transcatheter aortic valve replacement, transfemoral (N/A, 09/09/2017); Intraoperative transthoracic echocardiogram (N/A, 09/09/2017); PACEMAKER IMPLANT (N/A, 09/10/2017); Orbital lesion excision (Right, 09/01/2018); and Ectropion repair (Right, 09/01/2018).   His family history includes Diabetes in his maternal grandmother; Heart attack in his paternal grandmother; Hypertension in his father; Pancreatic cancer in his mother; Stroke in his father and maternal grandmother.He reports that he has never smoked. He has never used smokeless tobacco. He reports that he does not drink alcohol or use drugs.  Current Outpatient Medications on File Prior to Visit  Medication Sig Dispense Refill  . allopurinol (ZYLOPRIM) 300 MG tablet Take 450 mg by mouth daily.     [name] amLODipine (NORVASC) 5 MG tablet Take 1 tablet BID 180 tablet 1  . apixaban (ELIQUIS) 2.5 MG TABS tablet  Take 1 tablet (2.5 mg total) by mouth 2 (two) times daily. 180 tablet 1  . atorvastatin (LIPITOR) 80 MG  tablet TAKE ONE TABLET BY MOUTH AT BEDTIME 90 tablet 0  . azelastine (ASTELIN) 0.1 % nasal spray Place 2 sprays into both nostrils at bedtime as needed for rhinitis or allergies. 30 mL 3  . esomeprazole (NEXIUM) 40 MG capsule Take 1 capsule (40 mg total) by mouth daily. 30 capsule 5  . ezetimibe (ZETIA) 10 MG tablet TAKE 1 TABLET BY MOUTH EVERY DAY 90 tablet 3  . fenofibrate 160 MG tablet Take 1 tablet (160 mg total) by mouth daily. 90 tablet 3  . folic acid (FOLVITE) 1 MG tablet TAKE 2 TABLETS BY MOUTH EVERY DAY 180 tablet 4  . furosemide (LASIX) 40 MG tablet TAKE 3 TABLETS BY MOUTH 2 (TWO) TIMES DAILY. 540 tablet 2  . glimepiride (AMARYL) 2 MG tablet TAKE 1 TABLET BY MOUTH DAILY WITH BREAKFAST. NEED OV/LAB WORK 90 tablet 1  . KLOR-CON M20 20 MEQ tablet TAKE 2 TABLETS BY MOUTH 2 (TWO) TIMES DAILY. 360 tablet 2  . Lancets (FREESTYLE) lancets USE TWICE A DAY TO CHECK BLOOD SUGAR. DX E11.9 100 each 6  . metFORMIN (GLUCOPHAGE) 1000 MG tablet TAKE 1 & 1/2 TABLETS BY MOUTH EVERY MORNING AND 1 TABLET IN THE EVENING. 225 tablet 1  . metoprolol tartrate (LOPRESSOR) 25 MG tablet TAKE ONE-HALF TABLET BY MOUTH TWICE DAILY 90 tablet 3  . nitroGLYCERIN (NITROSTAT) 0.4 MG SL tablet PLACE 1 TABLET (0.4 MG TOTAL) UNDER THE TONGUE EVERY 5 (FIVE) MINUTES AS NEEDED FOR CHEST PAIN. 25 tablet 3  . polycarbophil (FIBERCON) 625 MG tablet Take 625 mg by mouth daily.    . pregabalin (LYRICA) 150 MG capsule Take 1 capsule (150 mg total) by mouth at bedtime. 90 capsule 1  . pregabalin (LYRICA) 75 MG capsule Take 1 po q am and 1 po mid day 180 capsule 1  . psyllium (METAMUCIL) 58.6 % powder Take 1 packet by mouth 3 (three) times daily.    [name] VASCEPA 1 g CAPS TAKE 2 CAPSULES BY MOUTH TWICE A DAY 120 capsule 3   Current Facility-Administered Medications on File Prior to Visit  Medication Dose Route Frequency Provider Last Rate Last Dose  . sodium chloride flush (NS) 0.9 % injection 10 mL  10 mL Intravenous PRN [name], MD   10 mL at 05/30/17 0920     Objective:  Objective  Physical Exam Vitals signs and nursing note reviewed.  Constitutional:      General: He is sleeping.     Appearance: He is well-developed.  HENT:     Head: Normocephalic and atraumatic.  Eyes:     Pupils: Pupils are equal, round, and reactive to light.  Neck:     Musculoskeletal: Normal range of motion and neck supple.     Thyroid: No thyromegaly.  Cardiovascular:     Rate and Rhythm: Normal rate and regular rhythm.     Heart sounds: No murmur.  Pulmonary:     Effort: Pulmonary effort is normal. No respiratory distress.     Breath sounds: Normal breath sounds. No wheezing or rales.  Chest:     Chest wall: No tenderness.  Musculoskeletal:        General: No tenderness.  Skin:    General: Skin is warm and dry.  Neurological:     Mental Status: He is oriented to person, place, and time.  Psychiatric:  Behavior: Behavior normal.        Thought Content: Thought content normal.        Judgment: Judgment normal.    BP 120/60 (BP Location: Right Arm, Patient Position: Sitting, Cuff Size: Normal)   Pulse 65   Temp 97.6 F (36.4 C) (Temporal)   Resp 18   Ht 6' (1.829 m)   Wt 252 lb 9.6 oz (114.6 kg)   SpO2 96%   BMI 34.26 kg/m  Wt Readings from Last 3 Encounters:  02/25/19 252 lb 9.6 oz (114.6 kg)  12/18/18 241 lb (109.3 kg)  10/26/18 251 lb 1.3 oz (113.9 kg)     Lab Results  Component Value Date   WBC 7.3 10/26/2018   HGB 12.0 (L) 10/26/2018   HCT 37.8 (L) 10/26/2018   PLT 191 10/26/2018   GLUCOSE 159 (H) 11/24/2018   CHOL 151 11/24/2018   TRIG 220.0 (H) 11/24/2018   HDL 33.40 (L) 11/24/2018   LDLDIRECT 84.0 11/24/2018   LDLCALC 57 11/21/2016   ALT 16 11/24/2018   AST 16 11/24/2018   NA 141 11/24/2018   K 4.3 11/24/2018   CL 100 11/24/2018   CREATININE 1.66 (H) 11/24/2018   BUN 35 (H) 11/24/2018   CO2 29 11/24/2018   TSH 2.450 07/27/2018   PSA 0.65 09/27/2013   INR 1.15 11/15/2017    HGBA1C 6.8 (H) 11/24/2018   MICROALBUR 0.4 03/15/2016    No results found.   Assessment & Plan:  Plan  I am having Richard [name] maintain his allopurinol, esomeprazole, polycarbophil, psyllium, freestyle, nitroGLYCERIN, fenofibrate, glimepiride, Vascepa, Klor-Con M20, furosemide, amLODipine, azelastine, folic acid, metoprolol tartrate, atorvastatin, pregabalin, pregabalin, ezetimibe, metFORMIN, apixaban, and FREESTYLE LITE.  Meds ordered this encounter  Medications  . glucose blood (FREESTYLE LITE) test strip    Sig: TEST BLOOD SUGAR TWICE A DAY. DUE FOR FOLLOW UP VISIT    Dispense:  100 strip    Refill:  1    DX Code Needed  .    Problem List Items Addressed This Visit      Unprioritized   DM (diabetes mellitus) type II uncontrolled, periph vascular disorder ([place])    hgba1 to be checked , minimize simple carbs. Increase exercise as tolerated. Continue current meds       Relevant Medications   glucose blood (FREESTYLE LITE) test strip   Other Relevant Orders   Lipid panel   Hemoglobin A1c   Comprehensive metabolic panel   Essential hypertension    Well controlled, no changes to meds. Encouraged heart healthy diet such as the DASH diet and exercise as tolerated.       Relevant Orders   Lipid panel   Hemoglobin A1c   Comprehensive metabolic panel   Hyperlipidemia - Primary   Relevant Orders   Lipid panel   Hemoglobin A1c   Comprehensive metabolic panel   Hyperlipidemia LDL goal <70    Tolerating statin, encouraged heart healthy diet, avoid trans fats, minimize simple carbs and saturated fats. Increase exercise as tolerated       Other Visit Diagnoses    Pain in both lower extremities       Relevant Orders   VAS [place] ABI WITH/WO TBI      Follow-up: Return in about 6 months (around 08/28/2019) for hypertension, hyperlipidemia, diabetes II.  [name], DO

## 2019-02-26 ENCOUNTER — Other Ambulatory Visit: Payer: Self-pay | Admitting: *Deleted

## 2019-02-26 ENCOUNTER — Inpatient Hospital Stay: Payer: Medicare Other

## 2019-02-26 ENCOUNTER — Other Ambulatory Visit: Payer: Self-pay

## 2019-02-26 ENCOUNTER — Telehealth: Payer: Self-pay | Admitting: Family Medicine

## 2019-02-26 ENCOUNTER — Inpatient Hospital Stay: Payer: Medicare Other | Attending: Hematology & Oncology

## 2019-02-26 VITALS — BP 128/55 | HR 59 | Temp 98.5°F | Resp 18

## 2019-02-26 DIAGNOSIS — D508 Other iron deficiency anemias: Secondary | ICD-10-CM

## 2019-02-26 DIAGNOSIS — E538 Deficiency of other specified B group vitamins: Secondary | ICD-10-CM | POA: Insufficient documentation

## 2019-02-26 DIAGNOSIS — D519 Vitamin B12 deficiency anemia, unspecified: Secondary | ICD-10-CM

## 2019-02-26 LAB — CBC WITH DIFFERENTIAL (CANCER CENTER ONLY)
Abs Immature Granulocytes: 0.05 10*3/uL (ref 0.00–0.07)
Basophils Absolute: 0 10*3/uL (ref 0.0–0.1)
Basophils Relative: 1 %
Eosinophils Absolute: 0.1 10*3/uL (ref 0.0–0.5)
Eosinophils Relative: 1 %
HCT: 36.3 % — ABNORMAL LOW (ref 39.0–52.0)
Hemoglobin: 11.4 g/dL — ABNORMAL LOW (ref 13.0–17.0)
Immature Granulocytes: 1 %
Lymphocytes Relative: 24 %
Lymphs Abs: 1.9 10*3/uL (ref 0.7–4.0)
MCH: 28.4 pg (ref 26.0–34.0)
MCHC: 31.4 g/dL (ref 30.0–36.0)
MCV: 90.3 fL (ref 80.0–100.0)
Monocytes Absolute: 0.7 10*3/uL (ref 0.1–1.0)
Monocytes Relative: 9 %
Neutro Abs: 5.3 10*3/uL (ref 1.7–7.7)
Neutrophils Relative %: 64 %
Platelet Count: 188 10*3/uL (ref 150–400)
RBC: 4.02 MIL/uL — ABNORMAL LOW (ref 4.22–5.81)
RDW: 17 % — ABNORMAL HIGH (ref 11.5–15.5)
WBC Count: 8.1 10*3/uL (ref 4.0–10.5)
nRBC: 0 % (ref 0.0–0.2)

## 2019-02-26 LAB — LDL CHOLESTEROL, DIRECT: Direct LDL: 53 mg/dL

## 2019-02-26 LAB — COMPREHENSIVE METABOLIC PANEL
ALT: 12 U/L (ref 0–53)
AST: 18 U/L (ref 0–37)
Albumin: 4.8 g/dL (ref 3.5–5.2)
Alkaline Phosphatase: 64 U/L (ref 39–117)
BUN: 34 mg/dL — ABNORMAL HIGH (ref 6–23)
CO2: 30 mEq/L (ref 19–32)
Calcium: 9.9 mg/dL (ref 8.4–10.5)
Chloride: 101 mEq/L (ref 96–112)
Creatinine, Ser: 2.08 mg/dL — ABNORMAL HIGH (ref 0.40–1.50)
GFR: 30.83 mL/min — ABNORMAL LOW (ref >60.00)
Glucose, Bld: 102 mg/dL — ABNORMAL HIGH (ref 70–99)
Potassium: 4.7 mEq/L (ref 3.5–5.1)
Sodium: 140 mEq/L (ref 135–145)
Total Bilirubin: 0.6 mg/dL (ref 0.2–1.2)
Total Protein: 7 g/dL (ref 6.0–8.3)

## 2019-02-26 LAB — LIPID PANEL
Cholesterol: 136 mg/dL (ref 0–200)
HDL: 26 mg/dL — ABNORMAL LOW (ref >39.00)
Total CHOL/HDL Ratio: 5
Triglycerides: 516 mg/dL — ABNORMAL HIGH (ref 0.0–149.0)

## 2019-02-26 LAB — VITAMIN B12: Vitamin B-12: 151 pg/mL — ABNORMAL LOW (ref 180–914)

## 2019-02-26 LAB — HEMOGLOBIN A1C: Hgb A1c MFr Bld: 6.7 % — ABNORMAL HIGH (ref 4.6–6.5)

## 2019-02-26 MED ORDER — CYANOCOBALAMIN 1000 MCG/ML IJ SOLN
1000.0000 ug | Freq: Once | INTRAMUSCULAR | Status: AC
  Administered 2019-02-26: 1000 ug via INTRAMUSCULAR

## 2019-02-26 MED ORDER — CYANOCOBALAMIN 1000 MCG/ML IJ SOLN
INTRAMUSCULAR | Status: AC
  Filled 2019-02-26: qty 1

## 2019-02-26 MED ORDER — DENOSUMAB 120 MG/1.7ML ~~LOC~~ SOLN
SUBCUTANEOUS | Status: AC
  Filled 2019-02-26: qty 1.7

## 2019-02-26 MED ORDER — SODIUM CHLORIDE 0.9% FLUSH
10.0000 mL | Freq: Once | INTRAVENOUS | Status: AC
  Administered 2019-02-26: 15:00:00 10 mL
  Filled 2019-02-26: qty 10

## 2019-02-26 MED ORDER — DENOSUMAB 120 MG/1.7ML ~~LOC~~ SOLN
120.0000 mg | Freq: Once | SUBCUTANEOUS | Status: AC
  Administered 2019-02-26: 15:00:00 120 mg via SUBCUTANEOUS

## 2019-02-26 MED ORDER — HEPARIN SOD (PORK) LOCK FLUSH 100 UNIT/ML IV SOLN
500.0000 [IU] | Freq: Once | INTRAVENOUS | Status: AC
  Administered 2019-02-26: 15:00:00 500 [IU] via INTRAVENOUS
  Filled 2019-02-26: qty 5

## 2019-02-26 NOTE — Patient Instructions (Signed)
Cyanocobalamin, Vitamin B12 injection What is this medicine? CYANOCOBALAMIN (sye an oh koe BAL a min) is a man made form of vitamin B12. Vitamin B12 is used in the growth of healthy blood cells, nerve cells, and proteins in the body. It also helps with the metabolism of fats and carbohydrates. This medicine is used to treat people who can not absorb vitamin B12. This medicine may be used for other purposes; ask your health care provider or pharmacist if you have questions. COMMON BRAND NAME(S): B-12 Compliance Kit, B-12 Injection Kit, Cyomin, LA-12, Nutri-Twelve, Physicians EZ Use B-12, Primabalt What should I tell my health care provider before I take this medicine? They need to know if you have any of these conditions:  kidney disease  Leber's disease  megaloblastic anemia  an unusual or allergic reaction to cyanocobalamin, cobalt, other medicines, foods, dyes, or preservatives  pregnant or trying to get pregnant  breast-feeding How should I use this medicine? This medicine is injected into a muscle or deeply under the skin. It is usually given by a health care professional in a clinic or doctor's office. However, your doctor may teach you how to inject yourself. Follow all instructions. Talk to your pediatrician regarding the use of this medicine in children. Special care may be needed. Overdosage: If you think you have taken too much of this medicine contact a poison control center or emergency room at once. NOTE: This medicine is only for you. Do not share this medicine with others. What if I miss a dose? If you are given your dose at a clinic or doctor's office, call to reschedule your appointment. If you give your own injections and you miss a dose, take it as soon as you can. If it is almost time for your next dose, take only that dose. Do not take double or extra doses. What may interact with this medicine?  colchicine  heavy alcohol intake This list may not describe all  possible interactions. Give your health care provider a list of all the medicines, herbs, non-prescription drugs, or dietary supplements you use. Also tell them if you smoke, drink alcohol, or use illegal drugs. Some items may interact with your medicine. What should I watch for while using this medicine? Visit your doctor or health care professional regularly. You may need blood work done while you are taking this medicine. You may need to follow a special diet. Talk to your doctor. Limit your alcohol intake and avoid smoking to get the best benefit. What side effects may I notice from receiving this medicine? Side effects that you should report to your doctor or health care professional as soon as possible:  allergic reactions like skin rash, itching or hives, swelling of the face, lips, or tongue  blue tint to skin  chest tightness, pain  difficulty breathing, wheezing  dizziness  red, swollen painful area on the leg Side effects that usually do not require medical attention (report to your doctor or health care professional if they continue or are bothersome):  diarrhea  headache This list may not describe all possible side effects. Call your doctor for medical advice about side effects. You may report side effects to FDA at 1-800-FDA-1088. Where should I keep my medicine? Keep out of the reach of children. Store at room temperature between 15 and 30 degrees C (59 and 85 degrees F). Protect from light. Throw away any unused medicine after the expiration date. NOTE: This sheet is a summary. It may not cover  all possible information. If you have questions about this medicine, talk to your doctor, pharmacist, or health care provider.  2020 Elsevier/Gold Standard (2007-09-28 22:10:20)  Denosumab injection Delton See) What is this medicine? DENOSUMAB (den oh sue mab) slows bone breakdown. Prolia is used to treat osteoporosis in women after menopause and in men, and in people who are  taking corticosteroids for 6 months or more. Delton See is used to treat a high calcium level due to cancer and to prevent bone fractures and other bone problems caused by multiple myeloma or cancer bone metastases. Delton See is also used to treat giant cell tumor of the bone. This medicine may be used for other purposes; ask your health care provider or pharmacist if you have questions. COMMON BRAND NAME(S): Prolia, XGEVA What should I tell my health care provider before I take this medicine? They need to know if you have any of these conditions:  dental disease  having surgery or tooth extraction  infection  kidney disease  low levels of calcium or Vitamin D in the blood  malnutrition  on hemodialysis  skin conditions or sensitivity  thyroid or parathyroid disease  an unusual reaction to denosumab, other medicines, foods, dyes, or preservatives  pregnant or trying to get pregnant  breast-feeding How should I use this medicine? This medicine is for injection under the skin. It is given by a health care professional in a hospital or clinic setting. A special MedGuide will be given to you before each treatment. Be sure to read this information carefully each time. For Prolia, talk to your pediatrician regarding the use of this medicine in children. Special care may be needed. For Delton See, talk to your pediatrician regarding the use of this medicine in children. While this drug may be prescribed for children as young as 13 years for selected conditions, precautions do apply. Overdosage: If you think you have taken too much of this medicine contact a poison control center or emergency room at once. NOTE: This medicine is only for you. Do not share this medicine with others. What if I miss a dose? It is important not to miss your dose. Call your doctor or health care professional if you are unable to keep an appointment. What may interact with this medicine? Do not take this medicine with any  of the following medications:  other medicines containing denosumab This medicine may also interact with the following medications:  medicines that lower your chance of fighting infection  steroid medicines like prednisone or cortisone This list may not describe all possible interactions. Give your health care provider a list of all the medicines, herbs, non-prescription drugs, or dietary supplements you use. Also tell them if you smoke, drink alcohol, or use illegal drugs. Some items may interact with your medicine. What should I watch for while using this medicine? Visit your doctor or health care professional for regular checks on your progress. Your doctor or health care professional may order blood tests and other tests to see how you are doing. Call your doctor or health care professional for advice if you get a fever, chills or sore throat, or other symptoms of a cold or flu. Do not treat yourself. This drug may decrease your body's ability to fight infection. Try to avoid being around people who are sick. You should make sure you get enough calcium and vitamin D while you are taking this medicine, unless your doctor tells you not to. Discuss the foods you eat and the vitamins you take  with your health care professional. See your dentist regularly. Brush and floss your teeth as directed. Before you have any dental work done, tell your dentist you are receiving this medicine. Do not become pregnant while taking this medicine or for 5 months after stopping it. Talk with your doctor or health care professional about your birth control options while taking this medicine. Women should inform their doctor if they wish to become pregnant or think they might be pregnant. There is a potential for serious side effects to an unborn child. Talk to your health care professional or pharmacist for more information. What side effects may I notice from receiving this medicine? Side effects that you should  report to your doctor or health care professional as soon as possible:  allergic reactions like skin rash, itching or hives, swelling of the face, lips, or tongue  bone pain  breathing problems  dizziness  jaw pain, especially after dental work  redness, blistering, peeling of the skin  signs and symptoms of infection like fever or chills; cough; sore throat; pain or trouble passing urine  signs of low calcium like fast heartbeat, muscle cramps or muscle pain; pain, tingling, numbness in the hands or feet; seizures  unusual bleeding or bruising  unusually weak or tired Side effects that usually do not require medical attention (report to your doctor or health care professional if they continue or are bothersome):  constipation  diarrhea  headache  joint pain  loss of appetite  muscle pain  runny nose  tiredness  upset stomach This list may not describe all possible side effects. Call your doctor for medical advice about side effects. You may report side effects to FDA at 1-800-FDA-1088. Where should I keep my medicine? This medicine is only given in a clinic, doctor's office, or other health care setting and will not be stored at home. NOTE: This sheet is a summary. It may not cover all possible information. If you have questions about this medicine, talk to your doctor, pharmacist, or health care provider.  2020 Elsevier/Gold Standard (2017-10-24 16:10:44)

## 2019-02-26 NOTE — Telephone Encounter (Signed)
Pt is requesting a call on Monday with this lab results

## 2019-03-01 ENCOUNTER — Other Ambulatory Visit: Payer: Self-pay | Admitting: Family Medicine

## 2019-03-01 DIAGNOSIS — I739 Peripheral vascular disease, unspecified: Secondary | ICD-10-CM

## 2019-03-01 LAB — IRON AND TIBC
Iron: 55 ug/dL (ref 42–163)
Saturation Ratios: 15 % — ABNORMAL LOW (ref 20–55)
TIBC: 361 ug/dL (ref 202–409)
UIBC: 306 ug/dL (ref 117–376)

## 2019-03-01 LAB — FERRITIN: Ferritin: 715 ng/mL — ABNORMAL HIGH (ref 24–336)

## 2019-03-01 NOTE — Telephone Encounter (Signed)
Patient notified

## 2019-03-01 NOTE — Telephone Encounter (Signed)
Pt called and is requesting to have his lab results and is requesting to know why he is having an ultrasound. Please advise.

## 2019-03-02 ENCOUNTER — Encounter (HOSPITAL_COMMUNITY): Payer: Medicare Other

## 2019-03-02 ENCOUNTER — Ambulatory Visit: Payer: Medicare Other | Admitting: Cardiology

## 2019-03-02 DIAGNOSIS — Z23 Encounter for immunization: Secondary | ICD-10-CM | POA: Diagnosis not present

## 2019-03-03 ENCOUNTER — Telehealth: Payer: Self-pay | Admitting: Hematology & Oncology

## 2019-03-03 NOTE — Telephone Encounter (Signed)
Spoke with patient regarding time change for 9/4 appt per 9/2 sch msg

## 2019-03-04 ENCOUNTER — Other Ambulatory Visit: Payer: Self-pay

## 2019-03-04 ENCOUNTER — Ambulatory Visit (HOSPITAL_COMMUNITY)
Admission: RE | Admit: 2019-03-04 | Discharge: 2019-03-04 | Disposition: A | Discharge status: Home / Self Care | Payer: Medicare Other | Source: Ambulatory Visit | Attending: Cardiovascular Disease | Admitting: Cardiovascular Disease

## 2019-03-04 DIAGNOSIS — I739 Peripheral vascular disease, unspecified: Secondary | ICD-10-CM | POA: Diagnosis not present

## 2019-03-05 ENCOUNTER — Ambulatory Visit: Payer: Medicare Other

## 2019-03-09 ENCOUNTER — Telehealth: Payer: Self-pay | Admitting: *Deleted

## 2019-03-09 NOTE — Telephone Encounter (Signed)
Message received from patient requesting to reschedule missed iron infusion appt from 03/05/19.  Message sent to scheduling.

## 2019-03-10 ENCOUNTER — Other Ambulatory Visit: Payer: Self-pay

## 2019-03-10 ENCOUNTER — Telehealth: Payer: Self-pay | Admitting: Family Medicine

## 2019-03-10 ENCOUNTER — Inpatient Hospital Stay: Payer: Medicare Other | Attending: Hematology & Oncology

## 2019-03-10 VITALS — BP 116/60 | HR 71 | Temp 97.5°F | Resp 16

## 2019-03-10 DIAGNOSIS — D519 Vitamin B12 deficiency anemia, unspecified: Secondary | ICD-10-CM

## 2019-03-10 DIAGNOSIS — E538 Deficiency of other specified B group vitamins: Secondary | ICD-10-CM | POA: Diagnosis not present

## 2019-03-10 DIAGNOSIS — D508 Other iron deficiency anemias: Secondary | ICD-10-CM | POA: Insufficient documentation

## 2019-03-10 MED ORDER — SODIUM CHLORIDE 0.9 % IV SOLN
510.0000 mg | Freq: Once | INTRAVENOUS | Status: AC
  Administered 2019-03-10: 510 mg via INTRAVENOUS
  Filled 2019-03-10: qty 510

## 2019-03-10 MED ORDER — SODIUM CHLORIDE 0.9% FLUSH
10.0000 mL | Freq: Once | INTRAVENOUS | Status: AC
  Administered 2019-03-10: 14:00:00 10 mL via INTRAVENOUS
  Filled 2019-03-10: qty 10

## 2019-03-10 MED ORDER — HEPARIN SOD (PORK) LOCK FLUSH 100 UNIT/ML IV SOLN
250.0000 [IU] | Freq: Once | INTRAVENOUS | Status: AC | PRN
  Administered 2019-03-10: 500 [IU]
  Filled 2019-03-10: qty 5

## 2019-03-10 MED ORDER — SODIUM CHLORIDE 0.9 % IV SOLN
Freq: Once | INTRAVENOUS | Status: AC
  Administered 2019-03-10: 13:00:00 via INTRAVENOUS
  Filled 2019-03-10: qty 250

## 2019-03-10 NOTE — Telephone Encounter (Signed)
Please advise 

## 2019-03-10 NOTE — Telephone Encounter (Signed)
Pt came in office stating that is wanting to get a referral to a hand doctor that can see him for an ingrowing nail on his right hand thumb. Pt was informed needing an appt with provider, but pt insisted wanting to send message to provider, if any question or needing appt please call pt at [phone]. Please advise.

## 2019-03-10 NOTE — Progress Notes (Signed)
Patient refused to wait 30 minutes after his iv feraheme infusion.

## 2019-03-10 NOTE — Patient Instructions (Signed)

## 2019-03-11 ENCOUNTER — Other Ambulatory Visit: Payer: Self-pay | Admitting: Family Medicine

## 2019-03-11 DIAGNOSIS — L600 Ingrowing nail: Secondary | ICD-10-CM

## 2019-03-11 NOTE — Telephone Encounter (Signed)
Referral in

## 2019-03-12 DIAGNOSIS — M25551 Pain in right hip: Secondary | ICD-10-CM | POA: Diagnosis not present

## 2019-03-12 DIAGNOSIS — M25562 Pain in left knee: Secondary | ICD-10-CM | POA: Diagnosis not present

## 2019-03-12 DIAGNOSIS — M545 Low back pain: Secondary | ICD-10-CM | POA: Diagnosis not present

## 2019-03-12 DIAGNOSIS — M79644 Pain in right finger(s): Secondary | ICD-10-CM | POA: Diagnosis not present

## 2019-03-12 DIAGNOSIS — M25552 Pain in left hip: Secondary | ICD-10-CM | POA: Diagnosis not present

## 2019-03-16 ENCOUNTER — Telehealth: Payer: Self-pay | Admitting: Cardiology

## 2019-03-16 DIAGNOSIS — Z5181 Encounter for therapeutic drug level monitoring: Secondary | ICD-10-CM | POA: Diagnosis not present

## 2019-03-16 DIAGNOSIS — R252 Cramp and spasm: Secondary | ICD-10-CM

## 2019-03-16 LAB — BASIC METABOLIC PANEL
BUN/Creatinine Ratio: 20 (ref 10–24)
BUN: 34 mg/dL — ABNORMAL HIGH (ref 8–27)
CO2: 24 mmol/L (ref 20–29)
Calcium: 10 mg/dL (ref 8.6–10.2)
Chloride: 96 mmol/L (ref 96–106)
Creatinine, Ser: 1.71 mg/dL — ABNORMAL HIGH (ref 0.76–1.27)
GFR calc Af Amer: 43 mL/min/{1.73_m2} — ABNORMAL LOW (ref >59)
GFR calc non Af Amer: 37 mL/min/{1.73_m2} — ABNORMAL LOW (ref >59)
Glucose: 169 mg/dL — ABNORMAL HIGH (ref 65–99)
Potassium: 4.3 mmol/L (ref 3.5–5.2)
Sodium: 136 mmol/L (ref 134–144)

## 2019-03-16 LAB — MAGNESIUM: Magnesium: 2.1 mg/dL (ref 1.6–2.3)

## 2019-03-16 NOTE — Telephone Encounter (Signed)
Spoke to pt regarding concerns with leg cramping and pain. Asked the pt if he was taking his lasix and potassium as prescribed. Told the pt that he should come in and get his labs drawn to check his K+ levels and for dehydration and then those labs could be further evaluated. Pt was also advised to keep his 04/02/19 appt with Dr. Percival Spanish. Rosaria Ferries, PA agreed with this advice

## 2019-03-16 NOTE — Telephone Encounter (Signed)
  Patient is calling c/o severe leg cramps in thigh area all night last night. He states he has had these cramps off and on for a while but last night it was so bad he almost went to the ER.  He is wondering if he should reduce his furosemide (LASIX) 40 MG tablet and KLOR-CON M20 20 MEQ tablet. He wonders if the cramps could be coming from dehydration.

## 2019-03-17 NOTE — Telephone Encounter (Signed)
Tonic water might work.  Also for the acute events he can try mustard or vinegar.

## 2019-03-17 NOTE — Telephone Encounter (Signed)
Advised patient, verbalized understanding  

## 2019-03-17 NOTE — Telephone Encounter (Signed)
Lab worked completed- any suggestions for leg cramps? Will route to MD.

## 2019-03-17 NOTE — Telephone Encounter (Signed)
° °  Please call with lab results. Patient concerned about leg cramps

## 2019-03-25 ENCOUNTER — Other Ambulatory Visit: Payer: Self-pay | Admitting: Family Medicine

## 2019-03-29 ENCOUNTER — Other Ambulatory Visit: Payer: Self-pay

## 2019-03-29 ENCOUNTER — Inpatient Hospital Stay: Payer: Medicare Other

## 2019-03-29 VITALS — BP 115/83 | HR 70 | Temp 98.0°F | Resp 16

## 2019-03-29 DIAGNOSIS — D508 Other iron deficiency anemias: Secondary | ICD-10-CM

## 2019-03-29 DIAGNOSIS — D519 Vitamin B12 deficiency anemia, unspecified: Secondary | ICD-10-CM

## 2019-03-29 DIAGNOSIS — E538 Deficiency of other specified B group vitamins: Secondary | ICD-10-CM | POA: Diagnosis not present

## 2019-03-29 MED ORDER — CYANOCOBALAMIN 1000 MCG/ML IJ SOLN
1000.0000 ug | Freq: Once | INTRAMUSCULAR | Status: AC
  Administered 2019-03-29: 15:00:00 1000 ug via INTRAMUSCULAR

## 2019-03-29 NOTE — Patient Instructions (Signed)

## 2019-04-01 NOTE — Progress Notes (Signed)
Cardiology Office Note   Date:  04/02/2019   ID:  NYAN DUFRESNE, DOB [birth year], MRN [id number]  PCP:  Carollee Herter, Alferd Apa, DO  Cardiologist:   Minus Breeding, MD  Chief Complaint  Patient presents with  . Foot Pain      History of Present Illness: Richard [name] is a 78 y.o. male who presents for follow up of CAD and TAVR.   He has had atrial flutter and heart block as well necessitating a pacemaker.   He has an ascending aortic aneurysm.  He saw Dr. Caryl Comes and had leg pain suggestive of claudication at the last visit.  However, since that appointment he has had lower extremity ultrasound there is been no suggestion of vascular disease.  He does get burning in his feet.  This happens when he raises his feet at the end of the day.  He has some chronic mild lower extremity swelling.  He overall feels so much better since he had his valve replacement.  He denies any acute shortness of breath, PND or orthopnea.  He said no new palpitations, presyncope or syncope.  He denies any chest pressure, neck or arm discomfort.  Past Medical History:  Diagnosis Date  . Anemia   . Arthritis    hips  . Axillary adenopathy 02/25/2017  . Bradycardia    a. holter monitor has demonstrated HRs in 30s and [place]   . CAD (coronary artery disease)    a. s/p CABG 2001  b.  07/28/2017 cath:   Severe three-vessel native CAD with total occlusion of LAD, ramus intermedius, first OM and RCA, patent RIMA to PDA, LIMA to LAD, sequential SVG to ramus intermedius and first OM.    [name] Chronic lower back pain   . Diffuse large B cell lymphoma ([place])   . Diverticulosis   . Esophageal stricture   . GERD (gastroesophageal reflux disease)   . History of gout   . HTN (hypertension)   . Mixed hyperlipidemia   . OSA on CPAP    with 2L O2 at night  . Pancytopenia ([place])    a. related to chemo therapy for B cell lymphoma  . Peptic stricture of esophagus   . Severe aortic stenosis   . Spinal stenosis   . Type II  diabetes mellitus ([place])   . Wears dentures    partial upper    Past Surgical History:  Procedure Laterality Date  . APPENDECTOMY  ~ 1952  . BACK SURGERY    . CATARACT EXTRACTION W/PHACO Left 11/06/2016   Procedure: CATARACT EXTRACTION PHACO AND INTRAOCULAR LENS PLACEMENT (IOC);  Surgeon: Estill Cotta, MD;  Location: [facility];  Service: Ophthalmology;  Laterality: Left;  Lot # [id number] H [place]: 01:09.4 AP%:25.2 CDE: 30.64  . CATARACT EXTRACTION W/PHACO Right 12/04/2016   Procedure: CATARACT EXTRACTION PHACO AND INTRAOCULAR LENS PLACEMENT (IOC);  Surgeon: Estill Cotta, MD;  Location: [facility];  Service: Ophthalmology;  Laterality: Right;  [place] 1:25.9 AP% 24.1 CDE 39.10 Fluid Pack lot # [id number]  . COLONOSCOPY W/ BIOPSIES AND POLYPECTOMY  2013  . CORONARY ANGIOPLASTY  1993  . CORONARY ANGIOPLASTY WITH STENT PLACEMENT  05/1997   "1"  . CORONARY ARTERY BYPASS GRAFT  03/2000   "CABG X5"  . ECTROPION REPAIR Right 09/01/2018   Procedure: REPAIR OF ECTROPION BILATERAL upper and lower;  Surgeon: Karle Starch, MD;  Location: [place];  Service: Ophthalmology;  Laterality: Right;  Diabetic - oral meds sleep apnea  .  ESOPHAGEAL DILATION  X 3-4   Dr. Lyla Son; "last one was in the 1990's"  . ESOPHAGOGASTRODUODENOSCOPY     multiple  . FLEXIBLE SIGMOIDOSCOPY     multiple  . HYDRADENITIS EXCISION Left 02/25/2017   Procedure: EXCISION DEEP LEFT AXILLARY LYMPH NODE;  Surgeon: Fanny Skates, MD;  Location: [place];  Service: General;  Laterality: Left;  . INTRAOPERATIVE TRANSTHORACIC ECHOCARDIOGRAM N/A 09/09/2017   Procedure: INTRAOPERATIVE TRANSTHORACIC ECHOCARDIOGRAM;  Surgeon: Burnell Blanks, MD;  Location: [place];  Service: Open Heart Surgery;  Laterality: N/A;  . KNEE ARTHROSCOPY Left 2011   meniscus repair  . LEFT HEART CATHETERIZATION WITH CORONARY/GRAFT ANGIOGRAM N/A 03/16/2014   Procedure: LEFT HEART CATHETERIZATION WITH Beatrix Fetters;  Surgeon: Burnell Blanks, MD;  Location: [facility];  Service: Cardiovascular;  Laterality: N/A;  . LUMBAR LAMINECTOMY/DECOMPRESSION MICRODISCECTOMY Right 06/17/2013   Procedure: LUMBAR LAMINECTOMY MICRODISCECTOMY L4-L5 RIGHT EXCISION OF SYNOVIAL CYST RIGHT   (1 LEVEL) RIGHT PARTIAL FACETECTOMY;  Surgeon: Tobi Bastos, MD;  Location: [facility];  Service: Orthopedics;  Laterality: Right;  . MYELOGRAM  04/06/13   lumbar, Dr Gladstone Lighter  . ORBITAL LESION EXCISION Right 09/01/2018   Procedure: ORBITOTOMY WITHOUT BONE FLAP WITH REMOVAL OF LESION RIGHT;  Surgeon: Karle Starch, MD;  Location: [place];  Service: Ophthalmology;  Laterality: Right;  . PACEMAKER IMPLANT N/A 09/10/2017   Procedure: PACEMAKER IMPLANT;  Surgeon: Deboraha Sprang, MD;  Location: [place] [facility];  Service: Cardiovascular;  Laterality: N/A;  . PANENDOSCOPY    . PORTACATH PLACEMENT N/A 03/06/2017   Procedure: INSERTION PORT-A-CATH AND ASPIRATE SEROMA LEFT AXILLA;  Surgeon: Fanny Skates, MD;  Location: [place];  Service: General;  Laterality: N/A;  . RIGHT/LEFT HEART CATH AND CORONARY/GRAFT ANGIOGRAPHY N/A 07/28/2017   Procedure: RIGHT/LEFT HEART CATH AND CORONARY/GRAFT ANGIOGRAPHY;  Surgeon: Sherren Mocha, MD;  Location: [place] [facility];  Service: Cardiovascular;  Laterality: N/A;  . SHOULDER SURGERY Right 08/2010   screws placed; "tendons tore off"  . SKIN CANCER EXCISION Right    "neck"  . TONSILLECTOMY  ~ 1954  . TRANSCATHETER AORTIC VALVE REPLACEMENT, TRANSFEMORAL N/A 09/09/2017   Procedure: TRANSCATHETER AORTIC VALVE REPLACEMENT, TRANSFEMORAL;  Surgeon: Burnell Blanks, MD;  Location: [place];  Service: Open Heart Surgery;  Laterality: N/A;  . UPPER GI ENDOSCOPY  2013   Gastritis; Dr Carlean Purl  . VASECTOMY       Current Outpatient Medications  Medication Sig Dispense Refill  . allopurinol (ZYLOPRIM) 300 MG tablet Take 450 mg by mouth daily.     [name] amLODipine (NORVASC) 5 MG tablet Take 1 tablet BID 180 tablet 1  .  amoxicillin (AMOXIL) 500 MG capsule Prior to dentist visit    . apixaban (ELIQUIS) 2.5 MG TABS tablet Take 1 tablet (2.5 mg total) by mouth 2 (two) times daily. 180 tablet 1  . atorvastatin (LIPITOR) 80 MG tablet TAKE ONE TABLET BY MOUTH AT BEDTIME 90 tablet 0  . azelastine (ASTELIN) 0.1 % nasal spray Place 2 sprays into both nostrils at bedtime as needed for rhinitis or allergies. 30 mL 3  . esomeprazole (NEXIUM) 40 MG capsule Take 1 capsule (40 mg total) by mouth daily. 30 capsule 5  . ezetimibe (ZETIA) 10 MG tablet TAKE 1 TABLET BY MOUTH EVERY DAY 90 tablet 3  . fenofibrate 160 MG tablet Take 1 tablet (160 mg total) by mouth daily. 90 tablet 3  . folic acid (FOLVITE) 1 MG tablet TAKE 2 TABLETS BY MOUTH  EVERY DAY 180 tablet 4  . furosemide (LASIX) 40 MG tablet TAKE 3 TABLETS BY MOUTH 2 (TWO) TIMES DAILY. 540 tablet 2  . glimepiride (AMARYL) 2 MG tablet Take 1 tablet (2 mg total) by mouth daily with breakfast. 90 tablet 1  . glucose blood (FREESTYLE LITE) test strip TEST BLOOD SUGAR TWICE A DAY. DUE FOR FOLLOW UP VISIT 100 strip 1  . KLOR-CON M20 20 MEQ tablet TAKE 2 TABLETS BY MOUTH 2 (TWO) TIMES DAILY. 360 tablet 2  . Lancets (FREESTYLE) lancets USE TWICE A DAY TO CHECK BLOOD SUGAR. DX E11.9 100 each 6  . metFORMIN (GLUCOPHAGE) 1000 MG tablet TAKE 1 & 1/2 TABLETS BY MOUTH EVERY MORNING AND 1 TABLET IN THE EVENING. 225 tablet 1  . metoprolol tartrate (LOPRESSOR) 25 MG tablet TAKE ONE-HALF TABLET BY MOUTH TWICE DAILY 90 tablet 3  . nitroGLYCERIN (NITROSTAT) 0.4 MG SL tablet PLACE 1 TABLET (0.4 MG TOTAL) UNDER THE TONGUE EVERY 5 (FIVE) MINUTES AS NEEDED FOR CHEST PAIN. 25 tablet 3  . polycarbophil (FIBERCON) 625 MG tablet Take 625 mg by mouth daily.    . pregabalin (LYRICA) 150 MG capsule Take 1 capsule (150 mg total) by mouth at bedtime. 90 capsule 1  . pregabalin (LYRICA) 75 MG capsule Take 1 po q am and 1 po mid day 180 capsule 1  . psyllium (METAMUCIL) 58.6 % powder Take 1 packet by mouth 3  (three) times daily.    [name] VASCEPA 1 g CAPS TAKE 2 CAPSULES BY MOUTH TWICE A DAY 120 capsule 3   No current facility-administered medications for this visit.    Facility-Administered Medications Ordered in Other Visits  Medication Dose Route Frequency Provider Last Rate Last Dose  . sodium chloride flush (NS) 0.9 % injection 10 mL  10 mL Intravenous PRN [name], MD   10 mL at 05/30/17 0920    Allergies:   Benazepril, Hctz [hydrochlorothiazide], Aspirin, and Lactose intolerance (gi)   ROS:  Please see the history of present illness.   Otherwise, review of systems are positive for none.   All other systems are reviewed and negative.    PHYSICAL EXAM: VS:  BP 124/66   Pulse 69   Temp (!) 97.2 F (36.2 C)   Ht 6' (1.829 m)   Wt 250 lb 3.2 oz (113.5 kg)   SpO2 93%   BMI 33.93 kg/m  , BMI Body mass index is 33.93 kg/m. GENERAL:  Well appearing NECK:  No jugular venous distention, waveform within normal limits, carotid upstroke brisk and symmetric, no bruits, no thyromegaly LUNGS:  Clear to auscultation bilaterally CHEST: Well healed sternotomy scar. HEART:  PMI not displaced or sustained,S1 and S2 within normal limits, no S3, no S4, no clicks, no rubs, 2 out of 6 apical systolic murmur radiating slightly after catheter check, no diastolic murmurs ABD:  Flat, positive bowel sounds normal in frequency in pitch, no bruits, no rebound, no guarding, no midline pulsatile mass, no hepatomegaly, no splenomegaly EXT:  2 plus pulses throughout, no edema, no cyanosis no clubbing    EKG:  EKG is ordered today. The ekg ordered today demonstrates atrial flutter, ventricular pacing 100% capture   Recent Labs: 07/27/2018: TSH 2.450 02/25/2019: ALT 12 02/26/2019: Hemoglobin 11.4; Platelet Count 188 03/16/2019: BUN 34; Creatinine, Ser 1.71; Magnesium 2.1; Potassium 4.3; Sodium 136    Lipid Panel    Component Value Date/Time   CHOL 136 02/25/2019 1555   TRIG (H) 02/25/2019 1555  516.0 Triglyceride is over 400; calculations on Lipids are invalid.   HDL 26.00 (L) 02/25/2019 1555   CHOLHDL 5 02/25/2019 1555   VLDL 44.0 (H) 11/24/2018 0815   LDLCALC 57 11/21/2016 1040   LDLDIRECT 53.0 02/25/2019 1555      Wt Readings from Last 3 Encounters:  04/02/19 250 lb 3.2 oz (113.5 kg)  02/25/19 252 lb 9.6 oz (114.6 kg)  12/18/18 241 lb (109.3 kg)      Other studies Reviewed: Additional studies/ records that were reviewed today include: Labs. Echo Review of the above records demonstrates:  Please see elsewhere in the note.     ASSESSMENT AND PLAN:   S/p TAVR:  She had normal TAVR function in May.  No change in therapy.  CADs/p CABG:   He has no ongoing chest pain.  No further evaluation is indicated.   2-1 AV blocks/pPPM:  He is up to date with follow up device monitoring.  I reviewed the note from Dr. Caryl Comes.  I reviewed Dr. Olin Pia note for this visit.   Atrial flutter: On Eliquis 5 mg twice daily.  No change in therapy.  Hypertension:Blood pressure is at target.  Continue meds as listed.  Hyperlipidemia:   LDL is 53.  He will continue the meds as listed.  This was checked in August.   DM 2:  A1c was 6.7 in August.  He will continue the meds as listed.  Ascending aortic aneurysm:  45 mm.  I will follow this up next year with a CT angiogram.  Preop: He is thinking of having orthopedic surgery.  He would be at acceptable risk for the planned procedure.  No change in therapy is indicated.  Claudication: I do not think this is claudication.  I think he might have neuropathy.  I have suggested trying benfotiamine  Carotid: He has had previous carotid stenosis.  We will arrange carotid Dopplers.  Current medicines are reviewed at length with the patient today.  The patient does not have concerns regarding medicines.  The following changes have been made:  As above  Labs/ tests ordered today include:   Orders Placed This Encounter  Procedures   . EKG 12-Lead     Disposition:   FU with me in one year.     Signed, Minus Breeding, MD  04/02/2019 5:35 PM    [facility]

## 2019-04-02 ENCOUNTER — Ambulatory Visit (INDEPENDENT_AMBULATORY_CARE_PROVIDER_SITE_OTHER): Payer: Medicare Other | Admitting: Cardiology

## 2019-04-02 ENCOUNTER — Encounter: Payer: Self-pay | Admitting: Cardiology

## 2019-04-02 ENCOUNTER — Other Ambulatory Visit: Payer: Self-pay

## 2019-04-02 VITALS — BP 124/66 | HR 69 | Temp 97.2°F | Ht 72.0 in | Wt 250.2 lb

## 2019-04-02 DIAGNOSIS — E785 Hyperlipidemia, unspecified: Secondary | ICD-10-CM | POA: Diagnosis not present

## 2019-04-02 DIAGNOSIS — I739 Peripheral vascular disease, unspecified: Secondary | ICD-10-CM

## 2019-04-02 DIAGNOSIS — Z952 Presence of prosthetic heart valve: Secondary | ICD-10-CM | POA: Diagnosis not present

## 2019-04-02 DIAGNOSIS — E1151 Type 2 diabetes mellitus with diabetic peripheral angiopathy without gangrene: Secondary | ICD-10-CM | POA: Diagnosis not present

## 2019-04-02 DIAGNOSIS — I480 Paroxysmal atrial fibrillation: Secondary | ICD-10-CM | POA: Diagnosis not present

## 2019-04-02 DIAGNOSIS — E1165 Type 2 diabetes mellitus with hyperglycemia: Secondary | ICD-10-CM

## 2019-04-02 DIAGNOSIS — I442 Atrioventricular block, complete: Secondary | ICD-10-CM

## 2019-04-02 DIAGNOSIS — I10 Essential (primary) hypertension: Secondary | ICD-10-CM

## 2019-04-02 DIAGNOSIS — IMO0002 Reserved for concepts with insufficient information to code with codable children: Secondary | ICD-10-CM

## 2019-04-02 NOTE — Patient Instructions (Signed)
Medication Instructions:  Your physician recommends that you continue on your current medications as directed. Please refer to the Current Medication list given to you today.  If you need a refill on your cardiac medications before your next appointment, please call your pharmacy.   Lab work: NONE   Testing/Procedures: NONE  Follow-Up: At [facility], you and your health needs are our priority.  As part of our continuing mission to provide you with exceptional heart care, we have created designated Provider Care Teams.  These Care Teams include your primary Cardiologist (physician) and Advanced Practice Providers (APPs -  Physician Assistants and Nurse Practitioners) who all work together to provide you with the care you need, when you need it. You will need a follow up appointment in 12 months.  Please call our office 2 months in advance to schedule this appointment.  You may see Minus Breeding, MD or one of the following Advanced Practice Providers on your designated Care Team:   Rosaria Ferries, PA-C Jory Sims, DNP, ANP  Any Other Special Instructions Will Be Listed Below (If Applicable). Get Benfotiamine over the counter.

## 2019-04-04 ENCOUNTER — Other Ambulatory Visit: Payer: Self-pay | Admitting: Family Medicine

## 2019-04-04 DIAGNOSIS — E781 Pure hyperglyceridemia: Secondary | ICD-10-CM

## 2019-04-05 ENCOUNTER — Telehealth: Payer: Self-pay

## 2019-04-05 ENCOUNTER — Other Ambulatory Visit: Payer: Self-pay

## 2019-04-05 ENCOUNTER — Telehealth: Payer: Self-pay | Admitting: Cardiology

## 2019-04-05 DIAGNOSIS — M545 Low back pain: Secondary | ICD-10-CM | POA: Diagnosis not present

## 2019-04-05 DIAGNOSIS — M25562 Pain in left knee: Secondary | ICD-10-CM | POA: Diagnosis not present

## 2019-04-05 DIAGNOSIS — M1712 Unilateral primary osteoarthritis, left knee: Secondary | ICD-10-CM | POA: Diagnosis not present

## 2019-04-05 DIAGNOSIS — M79644 Pain in right finger(s): Secondary | ICD-10-CM | POA: Diagnosis not present

## 2019-04-05 DIAGNOSIS — I350 Nonrheumatic aortic (valve) stenosis: Secondary | ICD-10-CM

## 2019-04-05 NOTE — Progress Notes (Signed)
Put in order for carotid doppler.

## 2019-04-05 NOTE — Telephone Encounter (Signed)
Patient of Dr. Percival Spanish walked in to office requesting his clearance be faxed to [facility] [phone].   Routed MD note from 04/02/2019 addressing clearance via epic fax function

## 2019-04-05 NOTE — Telephone Encounter (Signed)
Left message for patient to call and schedule carotid doppler ordered by Dr. Percival Spanish

## 2019-04-05 NOTE — Telephone Encounter (Signed)
Left message on pt's voicemail per DPR of needing to be scheduled for a carotid doppler. Sent message to scheduling.

## 2019-04-05 NOTE — Telephone Encounter (Signed)
-----   Message from Minus Breeding, MD sent at 04/02/2019  5:34 PM EDT ----- He needs carotid dopplers for known stenosis.  Thanks.

## 2019-04-06 DIAGNOSIS — M5136 Other intervertebral disc degeneration, lumbar region: Secondary | ICD-10-CM | POA: Diagnosis not present

## 2019-04-09 ENCOUNTER — Other Ambulatory Visit: Payer: Self-pay | Admitting: Cardiology

## 2019-04-09 DIAGNOSIS — I6523 Occlusion and stenosis of bilateral carotid arteries: Secondary | ICD-10-CM

## 2019-04-14 DIAGNOSIS — M25562 Pain in left knee: Secondary | ICD-10-CM | POA: Diagnosis not present

## 2019-04-14 DIAGNOSIS — M1712 Unilateral primary osteoarthritis, left knee: Secondary | ICD-10-CM | POA: Diagnosis not present

## 2019-04-15 ENCOUNTER — Other Ambulatory Visit: Payer: Self-pay

## 2019-04-15 ENCOUNTER — Ambulatory Visit (HOSPITAL_COMMUNITY)
Admission: RE | Admit: 2019-04-15 | Discharge: 2019-04-15 | Disposition: A | Discharge status: Home / Self Care | Payer: Medicare Other | Source: Ambulatory Visit | Attending: Cardiology | Admitting: Cardiology

## 2019-04-15 DIAGNOSIS — I6523 Occlusion and stenosis of bilateral carotid arteries: Secondary | ICD-10-CM | POA: Insufficient documentation

## 2019-04-22 ENCOUNTER — Encounter: Payer: Self-pay | Admitting: Pulmonary Disease

## 2019-04-22 ENCOUNTER — Encounter: Payer: Medicare Other | Admitting: *Deleted

## 2019-04-22 ENCOUNTER — Other Ambulatory Visit: Payer: Self-pay

## 2019-04-22 ENCOUNTER — Ambulatory Visit (INDEPENDENT_AMBULATORY_CARE_PROVIDER_SITE_OTHER): Payer: Medicare Other | Admitting: Pulmonary Disease

## 2019-04-22 ENCOUNTER — Other Ambulatory Visit: Payer: Self-pay | Admitting: Family Medicine

## 2019-04-22 DIAGNOSIS — I6523 Occlusion and stenosis of bilateral carotid arteries: Secondary | ICD-10-CM | POA: Diagnosis not present

## 2019-04-22 DIAGNOSIS — G4733 Obstructive sleep apnea (adult) (pediatric): Secondary | ICD-10-CM | POA: Diagnosis not present

## 2019-04-22 DIAGNOSIS — IMO0002 Reserved for concepts with insufficient information to code with codable children: Secondary | ICD-10-CM

## 2019-04-22 DIAGNOSIS — E1165 Type 2 diabetes mellitus with hyperglycemia: Secondary | ICD-10-CM

## 2019-04-22 DIAGNOSIS — E1151 Type 2 diabetes mellitus with diabetic peripheral angiopathy without gangrene: Secondary | ICD-10-CM

## 2019-04-22 NOTE — Assessment & Plan Note (Signed)
CPAP is working well on current settings of 15 cm He is very compliant and has no issues with daytime somnolence and fatigue. He does have a leak which is related to mask seal  Weight loss encouraged, compliance with goal of at least 4-6 hrs every night is the expectation. Advised against medications with sedative side effects Cautioned against driving when sleepy - understanding that sleepiness will vary on a day to day basis   You are cleared for knee surgery-take your machine with you to sleep in the hospital

## 2019-04-22 NOTE — Progress Notes (Signed)
   Subjective:    Patient ID: Richard [name], male    DOB: 1940/12/25, 78 y.o.   MRN: [id number]  HPI  78 yo man For FU of obstructive sleep apnea. He got a new CPAP machine in 10/2015  PMH -  diffuse large B-cell lymphoma and underwent chemotherapy(ennever) , in remission severe AS s/pTAVR & PPM.  Annual follow-up, he is doing well, denies daytime somnolence and fatigue. No problems with mask or pressure. CPAP download was reviewed which shows excellent compliance , no residual events and large leak.  He is scheduled for left knee replacement in December He feels like he has increased energy ever since he had TAVR.  He remains on anticoagulation  Significant tests/ events 2008 PSGRDI 48/h ONO on CPAP /RA 08/2014 >> no desatn - dc O2  Review of Systems neg for any significant sore throat, dysphagia, itching, sneezing, nasal congestion or excess/ purulent secretions, fever, chills, sweats, unintended wt loss, pleuritic or exertional cp, hempoptysis, orthopnea pnd or change in chronic leg swelling. Also denies presyncope, palpitations, heartburn, abdominal pain, nausea, vomiting, diarrhea or change in bowel or urinary habits, dysuria,hematuria, rash, arthralgias, visual complaints, headache, numbness weakness or ataxia.     Objective:   Physical Exam  Gen. Pleasant, obese, in no distress ENT - no lesions, no post nasal drip Neck: No JVD, no thyromegaly, no carotid bruits Lungs: no use of accessory muscles, no dullness to percussion, decreased without rales or rhonchi  Cardiovascular: Rhythm regular, heart sounds  normal, no murmurs or gallops, no peripheral edema Musculoskeletal: No deformities, no cyanosis or clubbing , no tremors       Assessment & Plan:

## 2019-04-22 NOTE — Patient Instructions (Signed)
CPAP is working well on current settings. You have a mild leak.  You are cleared for knee surgery-care machine with you to sleep in the hospital

## 2019-04-27 ENCOUNTER — Inpatient Hospital Stay (HOSPITAL_BASED_OUTPATIENT_CLINIC_OR_DEPARTMENT_OTHER): Payer: Medicare Other | Admitting: Hematology & Oncology

## 2019-04-27 ENCOUNTER — Inpatient Hospital Stay: Payer: Medicare Other | Attending: Hematology & Oncology

## 2019-04-27 ENCOUNTER — Inpatient Hospital Stay: Payer: Medicare Other

## 2019-04-27 ENCOUNTER — Other Ambulatory Visit: Payer: Self-pay

## 2019-04-27 ENCOUNTER — Encounter: Payer: Self-pay | Admitting: Hematology & Oncology

## 2019-04-27 VITALS — BP 128/57 | HR 60 | Temp 97.5°F | Resp 20 | Wt 255.1 lb

## 2019-04-27 DIAGNOSIS — R6889 Other general symptoms and signs: Secondary | ICD-10-CM | POA: Insufficient documentation

## 2019-04-27 DIAGNOSIS — C8332 Diffuse large B-cell lymphoma, intrathoracic lymph nodes: Secondary | ICD-10-CM

## 2019-04-27 DIAGNOSIS — D510 Vitamin B12 deficiency anemia due to intrinsic factor deficiency: Secondary | ICD-10-CM | POA: Diagnosis not present

## 2019-04-27 DIAGNOSIS — Z7901 Long term (current) use of anticoagulants: Secondary | ICD-10-CM | POA: Diagnosis not present

## 2019-04-27 DIAGNOSIS — K219 Gastro-esophageal reflux disease without esophagitis: Secondary | ICD-10-CM | POA: Diagnosis not present

## 2019-04-27 DIAGNOSIS — Z95828 Presence of other vascular implants and grafts: Secondary | ICD-10-CM

## 2019-04-27 DIAGNOSIS — Z79899 Other long term (current) drug therapy: Secondary | ICD-10-CM | POA: Insufficient documentation

## 2019-04-27 DIAGNOSIS — E785 Hyperlipidemia, unspecified: Secondary | ICD-10-CM | POA: Diagnosis not present

## 2019-04-27 DIAGNOSIS — E119 Type 2 diabetes mellitus without complications: Secondary | ICD-10-CM | POA: Insufficient documentation

## 2019-04-27 DIAGNOSIS — D519 Vitamin B12 deficiency anemia, unspecified: Secondary | ICD-10-CM

## 2019-04-27 DIAGNOSIS — Z7984 Long term (current) use of oral hypoglycemic drugs: Secondary | ICD-10-CM | POA: Insufficient documentation

## 2019-04-27 DIAGNOSIS — I6523 Occlusion and stenosis of bilateral carotid arteries: Secondary | ICD-10-CM | POA: Diagnosis not present

## 2019-04-27 DIAGNOSIS — I10 Essential (primary) hypertension: Secondary | ICD-10-CM | POA: Diagnosis not present

## 2019-04-27 DIAGNOSIS — D508 Other iron deficiency anemias: Secondary | ICD-10-CM | POA: Insufficient documentation

## 2019-04-27 DIAGNOSIS — D500 Iron deficiency anemia secondary to blood loss (chronic): Secondary | ICD-10-CM | POA: Diagnosis not present

## 2019-04-27 LAB — CMP (CANCER CENTER ONLY)
ALT: 17 U/L (ref 0–44)
AST: 19 U/L (ref 15–41)
Albumin: 4.7 g/dL (ref 3.5–5.0)
Alkaline Phosphatase: 52 U/L (ref 38–126)
Anion gap: 9 (ref 5–15)
BUN: 28 mg/dL — ABNORMAL HIGH (ref 8–23)
CO2: 28 mmol/L (ref 22–32)
Calcium: 9.5 mg/dL (ref 8.9–10.3)
Chloride: 102 mmol/L (ref 98–111)
Creatinine: 1.69 mg/dL — ABNORMAL HIGH (ref 0.61–1.24)
GFR, Est AFR Am: 43 mL/min — ABNORMAL LOW (ref >60)
GFR, Estimated: 38 mL/min — ABNORMAL LOW (ref >60)
Glucose, Bld: 199 mg/dL — ABNORMAL HIGH (ref 70–99)
Potassium: 3.7 mmol/L (ref 3.5–5.1)
Sodium: 139 mmol/L (ref 135–145)
Total Bilirubin: 0.5 mg/dL (ref 0.3–1.2)
Total Protein: 6.6 g/dL (ref 6.5–8.1)

## 2019-04-27 LAB — CBC WITH DIFFERENTIAL (CANCER CENTER ONLY)
Abs Immature Granulocytes: 0.11 10*3/uL — ABNORMAL HIGH (ref 0.00–0.07)
Basophils Absolute: 0 10*3/uL (ref 0.0–0.1)
Basophils Relative: 1 %
Eosinophils Absolute: 0.1 10*3/uL (ref 0.0–0.5)
Eosinophils Relative: 1 %
HCT: 36.2 % — ABNORMAL LOW (ref 39.0–52.0)
Hemoglobin: 11.4 g/dL — ABNORMAL LOW (ref 13.0–17.0)
Immature Granulocytes: 2 %
Lymphocytes Relative: 21 %
Lymphs Abs: 1.3 10*3/uL (ref 0.7–4.0)
MCH: 28.4 pg (ref 26.0–34.0)
MCHC: 31.5 g/dL (ref 30.0–36.0)
MCV: 90 fL (ref 80.0–100.0)
Monocytes Absolute: 0.6 10*3/uL (ref 0.1–1.0)
Monocytes Relative: 9 %
Neutro Abs: 4.2 10*3/uL (ref 1.7–7.7)
Neutrophils Relative %: 66 %
Platelet Count: 179 10*3/uL (ref 150–400)
RBC: 4.02 MIL/uL — ABNORMAL LOW (ref 4.22–5.81)
RDW: 17.2 % — ABNORMAL HIGH (ref 11.5–15.5)
WBC Count: 6.3 10*3/uL (ref 4.0–10.5)
nRBC: 0 % (ref 0.0–0.2)

## 2019-04-27 LAB — RETICULOCYTES
Immature Retic Fract: 31.9 % — ABNORMAL HIGH (ref 2.3–15.9)
RBC.: 4.03 MIL/uL — ABNORMAL LOW (ref 4.22–5.81)
Retic Count, Absolute: 69.7 10*3/uL (ref 19.0–186.0)
Retic Ct Pct: 1.7 % (ref 0.4–3.1)

## 2019-04-27 LAB — VITAMIN B12: Vitamin B-12: 167 pg/mL — ABNORMAL LOW (ref 180–914)

## 2019-04-27 MED ORDER — CYANOCOBALAMIN 1000 MCG/ML IJ SOLN
1000.0000 ug | Freq: Once | INTRAMUSCULAR | Status: AC
  Administered 2019-04-27: 1000 ug via INTRAMUSCULAR

## 2019-04-27 MED ORDER — SODIUM CHLORIDE 0.9% FLUSH
10.0000 mL | INTRAVENOUS | Status: DC | PRN
  Administered 2019-04-27: 10 mL via INTRAVENOUS
  Filled 2019-04-27: qty 10

## 2019-04-27 MED ORDER — CYANOCOBALAMIN 1000 MCG/ML IJ SOLN
INTRAMUSCULAR | Status: AC
  Filled 2019-04-27: qty 1

## 2019-04-27 MED ORDER — HEPARIN SOD (PORK) LOCK FLUSH 100 UNIT/ML IV SOLN
500.0000 [IU] | Freq: Once | INTRAVENOUS | Status: AC
  Administered 2019-04-27: 500 [IU] via INTRAVENOUS
  Filled 2019-04-27: qty 5

## 2019-04-27 NOTE — Progress Notes (Signed)
Hematology and Oncology Follow Up Visit  Richard [name] [id number] 04/17/1941 78 y.o. 04/27/2019   Principle Diagnosis:  Diffuse large cell non-Hodgkin's lymphoma (IPI = 3) - NOT "double hit" Pernicious anemia Iron deficiency secondary to bleeding  Current Therapy:   R-CHOP - s/p cycle#8 - completed 08/2017 Vitamin B12 1 mg IM every month Xgeva 120 mg subcu q 3 months - next dose in 06/2019 IV iron with Feraheme.  Last dose given 03/2019   Interim History:  Richard [name] is here today for follow-up.  He looks fantastic.  He feels so well.  He found out that he will have knee surgery for his left knee on December 29.  He has bad osteoarthritis of the left knee.  He has done incredibly well with respect to the vitamin B12 and the IV iron.  He has been maintaining his blood counts.  I am a little bit worried about his renal function.  I suspect that he is going to have a very low erythropoietin level.  He has had no obvious bleeding.  He has had no issues with bowels or bladder.  He has not noted any palpable lymph nodes.  There is been no cough.  Overall, his performance status is ECOG 1.  Medications:  Allergies as of 04/27/2019      Reactions   Benazepril Swelling   angioedema; he is not a candidate for any angiotensin receptor blockers because of this significant allergic reaction. Because of a history of documented adverse serious drug reaction;Medi Alert bracelet  is recommended   Hctz [hydrochlorothiazide] Anaphylaxis, Swelling   Tongue and lip swelling   Aspirin Other (See Comments)   Gastritis, cant take 325 Mg aspirin    Lactose Intolerance (gi) Nausea And Vomiting      Medication List       Accurate as of April 27, 2019  1:06 PM. If you have any questions, ask your nurse or doctor.        allopurinol 300 MG tablet Commonly known as: ZYLOPRIM Take 450 mg by mouth daily.   amLODipine 5 MG tablet Commonly known as: NORVASC Take 1 tablet BID   amoxicillin  500 MG capsule Commonly known as: AMOXIL Prior to dentist visit   apixaban 2.5 MG Tabs tablet Commonly known as: ELIQUIS Take 1 tablet (2.5 mg total) by mouth 2 (two) times daily.   atorvastatin 80 MG tablet Commonly known as: LIPITOR TAKE ONE TABLET BY MOUTH AT BEDTIME   azelastine 0.1 % nasal spray Commonly known as: ASTELIN Place 2 sprays into both nostrils at bedtime as needed for rhinitis or allergies.   esomeprazole 40 MG capsule Commonly known as: NexIUM Take 1 capsule (40 mg total) by mouth daily.   ezetimibe 10 MG tablet Commonly known as: ZETIA TAKE 1 TABLET BY MOUTH EVERY DAY   fenofibrate 160 MG tablet Take 1 tablet (160 mg total) by mouth daily.   folic acid 1 MG tablet Commonly known as: FOLVITE TAKE 2 TABLETS BY MOUTH EVERY DAY   freestyle lancets USE TWICE A DAY TO CHECK BLOOD SUGAR. DX E11.9   FREESTYLE LITE test strip Generic drug: glucose blood TEST BLOOD SUGAR TWICE A DAY. DUE FOR FOLLOW UP VISIT   furosemide 40 MG tablet Commonly known as: LASIX TAKE 3 TABLETS BY MOUTH 2 (TWO) TIMES DAILY.   glimepiride 2 MG tablet Commonly known as: AMARYL Take 1 tablet (2 mg total) by mouth daily with breakfast.   Klor-Con M20 20 MEQ tablet Generic  drug: potassium chloride SA TAKE 2 TABLETS BY MOUTH 2 (TWO) TIMES DAILY.   metFORMIN 1000 MG tablet Commonly known as: GLUCOPHAGE TAKE 1 & 1/2 TABLETS BY MOUTH EVERY MORNING AND 1 TABLET IN THE EVENING.   metoprolol tartrate 25 MG tablet Commonly known as: LOPRESSOR TAKE ONE-HALF TABLET BY MOUTH TWICE DAILY   nitroGLYCERIN 0.4 MG SL tablet Commonly known as: NITROSTAT PLACE 1 TABLET (0.4 MG TOTAL) UNDER THE TONGUE EVERY 5 (FIVE) MINUTES AS NEEDED FOR CHEST PAIN.   polycarbophil 625 MG tablet Commonly known as: FIBERCON Take 625 mg by mouth daily.   pregabalin 75 MG capsule Commonly known as: Lyrica Take 1 po q am and 1 po mid day   pregabalin 150 MG capsule Commonly known as: Lyrica Take 1  capsule (150 mg total) by mouth at bedtime.   psyllium 58.6 % powder Commonly known as: METAMUCIL Take 1 packet by mouth 3 (three) times daily.   Vascepa 1 g Caps Generic drug: Icosapent Ethyl TAKE 2 CAPSULES BY MOUTH TWICE A DAY       Allergies:  Allergies  Allergen Reactions  . Benazepril Swelling    angioedema; he is not a candidate for any angiotensin receptor blockers because of this significant allergic reaction. Because of a history of documented adverse serious drug reaction;Medi Alert bracelet  is recommended  . Hctz [Hydrochlorothiazide] Anaphylaxis and Swelling    Tongue and lip swelling   . Aspirin Other (See Comments)    Gastritis, cant take 325 Mg aspirin   . Lactose Intolerance (Gi) Nausea And Vomiting    Past Medical History, Surgical history, Social history, and Family History were reviewed and updated.  Review of Systems: Review of Systems  Constitutional: Negative.   HENT: Positive for congestion.   Eyes: Negative.   Respiratory: Negative.   Cardiovascular: Negative.   Gastrointestinal: Negative.   Genitourinary: Negative.   Musculoskeletal: Negative.   Skin: Negative.   Neurological: Negative.   Endo/Heme/Allergies: Negative.      Physical Exam:  weight is 255 lb 1.9 oz (115.7 kg). His oral temperature is 97.5 F (36.4 C) (abnormal). His blood pressure is 128/57 (abnormal) and his pulse is 60. His respiration is 20 and oxygen saturation is 99%.   Wt Readings from Last 3 Encounters:  04/27/19 255 lb 1.9 oz (115.7 kg)  04/22/19 253 lb 12.8 oz (115.1 kg)  04/02/19 250 lb 3.2 oz (113.5 kg)    Physical Exam Vitals signs reviewed.  HENT:     Head: Normocephalic and atraumatic.  Eyes:     Pupils: Pupils are equal, round, and reactive to light.  Neck:     Musculoskeletal: Normal range of motion.  Cardiovascular:     Rate and Rhythm: Normal rate and regular rhythm.     Heart sounds: Normal heart sounds.  Pulmonary:     Effort: Pulmonary  effort is normal.     Breath sounds: Normal breath sounds.  Abdominal:     General: Bowel sounds are normal.     Palpations: Abdomen is soft.  Musculoskeletal: Normal range of motion.        General: No tenderness or deformity.  Lymphadenopathy:     Cervical: No cervical adenopathy.  Skin:    General: Skin is warm and dry.     Findings: No erythema or rash.  Neurological:     Mental Status: He is alert and oriented to person, place, and time.  Psychiatric:        Behavior: Behavior normal.  Thought Content: Thought content normal.        Judgment: Judgment normal.     Lab Results  Component Value Date   WBC 6.3 04/27/2019   HGB 11.4 (L) 04/27/2019   HCT 36.2 (L) 04/27/2019   MCV 90.0 04/27/2019   PLT 179 04/27/2019   Lab Results  Component Value Date   FERRITIN 715 (H) 02/26/2019   IRON 55 02/26/2019   TIBC 361 02/26/2019   UIBC 306 02/26/2019   IRONPCTSAT 15 (L) 02/26/2019   Lab Results  Component Value Date   RETICCTPCT 1.7 04/27/2019   RBC 4.02 (L) 04/27/2019   RBC 4.03 (L) 04/27/2019   RETICCTABS 52.1 11/22/2011   No results found for: Nils Pyle [facility] Lab Results  Component Value Date   IGA 159 03/09/2012   Lab Results  Component Value Date   ALBUMINELP 4.2 07/27/2018   MSPIKE Not Observed 07/27/2018     Chemistry      Component Value Date/Time   NA 139 04/27/2019 1140   NA 136 03/16/2019 0938   NA 144 06/27/2017 0857   K 3.7 04/27/2019 1140   K 3.9 06/27/2017 0857   CL 102 04/27/2019 1140   CL 103 06/27/2017 0857   CO2 28 04/27/2019 1140   CO2 26 06/27/2017 0857   BUN 28 (H) 04/27/2019 1140   BUN 34 (H) 03/16/2019 0938   BUN 12 06/27/2017 0857   CREATININE 1.69 (H) 04/27/2019 1140   CREATININE 1.0 06/27/2017 0857      Component Value Date/Time   CALCIUM 9.5 04/27/2019 1140   CALCIUM 9.2 06/27/2017 0857   ALKPHOS 52 04/27/2019 1140   ALKPHOS 128 (H) 06/27/2017 0857   AST 19 04/27/2019 1140   ALT 17  04/27/2019 1140   ALT 24 06/27/2017 0857   BILITOT 0.5 04/27/2019 1140      Impression and Plan: Richard [name] is a very pleasant 78 yo caucasian gentleman with diffuse large B-cell lymphoma (not double hit lymphoma).   As far as the lymphoma goes, everything looks quite good.  It is been over 1-1/2 years that he has had his treatments.  I do not see anything that looks like recurrence.  He is not due for his Xgeva until we see him back.  I will see him back in December.  I would like to see him back a couple weeks before he has his surgery to make sure that his blood counts are okay for his surgery that he will not need any kind of transfusions.   [name], MD 10/27/20201:06 PM

## 2019-04-27 NOTE — Patient Instructions (Signed)

## 2019-04-28 LAB — IRON AND TIBC
Iron: 69 ug/dL (ref 42–163)
Saturation Ratios: 20 % (ref 20–55)
TIBC: 343 ug/dL (ref 202–409)
UIBC: 273 ug/dL (ref 117–376)

## 2019-04-28 LAB — FERRITIN: Ferritin: 737 ng/mL — ABNORMAL HIGH (ref 24–336)

## 2019-04-29 ENCOUNTER — Ambulatory Visit (INDEPENDENT_AMBULATORY_CARE_PROVIDER_SITE_OTHER): Payer: Medicare Other | Admitting: *Deleted

## 2019-04-29 DIAGNOSIS — I442 Atrioventricular block, complete: Secondary | ICD-10-CM

## 2019-04-29 DIAGNOSIS — I480 Paroxysmal atrial fibrillation: Secondary | ICD-10-CM

## 2019-04-29 LAB — CUP PACEART REMOTE DEVICE CHECK
Battery Remaining Longevity: 61 mo
Battery Voltage: 2.96 V
Brady Statistic AP VP Percent: 86.34 %
Brady Statistic AP VS Percent: 0 %
Brady Statistic AS VP Percent: 51.4 %
Brady Statistic AS VS Percent: 0 %
Brady Statistic RA Percent Paced: 0.02 %
Brady Statistic RV Percent Paced: 96.44 %
Date Time Interrogation Session: 20201029121039
Implantable Lead Implant Date: 20190313
Implantable Lead Implant Date: 20190313
Implantable Lead Location: 753859
Implantable Lead Location: 753860
Implantable Lead Model: 3830
Implantable Lead Model: 5076
Implantable Pulse Generator Implant Date: 20190313
Lead Channel Impedance Value: 304 Ohm
Lead Channel Impedance Value: 323 Ohm
Lead Channel Impedance Value: 399 Ohm
Lead Channel Impedance Value: 532 Ohm
Lead Channel Pacing Threshold Amplitude: 1.5 V
Lead Channel Pacing Threshold Pulse Width: 0.4 ms
Lead Channel Sensing Intrinsic Amplitude: 1.75 mV
Lead Channel Sensing Intrinsic Amplitude: 1.75 mV
Lead Channel Sensing Intrinsic Amplitude: 4.375 mV
Lead Channel Sensing Intrinsic Amplitude: 4.375 mV
Lead Channel Setting Pacing Amplitude: 3.5 V
Lead Channel Setting Pacing Amplitude: 3.5 V
Lead Channel Setting Pacing Pulse Width: 1 ms
Lead Channel Setting Sensing Sensitivity: 1.2 mV

## 2019-04-30 ENCOUNTER — Other Ambulatory Visit: Payer: Self-pay | Admitting: *Deleted

## 2019-04-30 DIAGNOSIS — E1151 Type 2 diabetes mellitus with diabetic peripheral angiopathy without gangrene: Secondary | ICD-10-CM

## 2019-04-30 DIAGNOSIS — IMO0002 Reserved for concepts with insufficient information to code with codable children: Secondary | ICD-10-CM

## 2019-04-30 MED ORDER — FREESTYLE LITE TEST VI STRP: ORAL_STRIP | 1 refills | Status: DC

## 2019-04-30 NOTE — Telephone Encounter (Signed)
Insurance will not cover twice a day.  Patient is not on any insulin.  So resent in for once a day.

## 2019-05-18 DIAGNOSIS — M25562 Pain in left knee: Secondary | ICD-10-CM | POA: Diagnosis not present

## 2019-05-20 DIAGNOSIS — G5793 Unspecified mononeuropathy of bilateral lower limbs: Secondary | ICD-10-CM | POA: Diagnosis not present

## 2019-05-20 DIAGNOSIS — M150 Primary generalized (osteo)arthritis: Secondary | ICD-10-CM | POA: Diagnosis not present

## 2019-05-20 DIAGNOSIS — Z6836 Body mass index (BMI) 36.0-36.9, adult: Secondary | ICD-10-CM | POA: Diagnosis not present

## 2019-05-20 DIAGNOSIS — E669 Obesity, unspecified: Secondary | ICD-10-CM | POA: Diagnosis not present

## 2019-05-20 DIAGNOSIS — M1A09X0 Idiopathic chronic gout, multiple sites, without tophus (tophi): Secondary | ICD-10-CM | POA: Diagnosis not present

## 2019-05-21 NOTE — Progress Notes (Signed)
Remote pacemaker transmission.   

## 2019-06-10 NOTE — H&P (Signed)
TOTAL KNEE ADMISSION H&P  Patient is being admitted for left total knee arthroplasty.  Subjective:  Chief Complaint:  Left knee primary OA / pain  HPI: Richard [name], 78 y.o. male, has a history of pain and functional disability in the left knee due to arthritis and has failed non-surgical conservative treatments for greater than 12 weeks to include NSAID's and/or analgesics, corticosteriod injections and activity modification.  Onset of symptoms was gradual, starting >10 years ago with gradually worsening course since that time. The patient noted prior procedures on the knee to include  arthroscopy and menisectomy on the left knee(s).  Patient currently rates pain in the left knee(s) at 9 out of 10 with activity. Patient has worsening of pain with activity and weight bearing, pain that interferes with activities of daily living, pain with passive range of motion, crepitus and joint swelling.  Patient has evidence of periarticular osteophytes and joint space narrowing by imaging studies.  There is no active infection.  Risks, benefits and expectations were discussed with the patient.  Risks including but not limited to the risk of anesthesia, blood clots, nerve damage, blood vessel damage, failure of the prosthesis, infection and up to and including death.  Patient understand the risks, benefits and expectations and wishes to proceed with surgery.   PCP: [name], DO  D/C Plans:       Home   Post-op Meds:       No Rx given  Tranexamic Acid:      To be given - IV   Decadron:      Is to be given  FYI:     Eliquis (on pre-op)  Norco  CPAP  DME:   Pt equipment arranged  PT:   OPPT arranged  Ph[facility]: [facility] [id number], [place], [place]    Patient Active Problem List   Diagnosis Date Noted  . Peripheral neuropathy 07/27/2018  . Uncontrolled type 2 diabetes mellitus with hyperglycemia ([place]) 05/13/2018  . Diabetic peripheral neuropathy associated with type 2  diabetes mellitus ([place]) 05/13/2018  . Pansinusitis 05/13/2018  . Severe aortic stenosis   . Complete heart block ([place])   . Paroxysmal atrial fibrillation (H[facility])   . Acute on chronic diastolic CHF (congestive heart failure), NYHA class 2 ([place])   . Left arm swelling   . Status post transcatheter aortic valve replacement (TAVR) using bioprosthesis 09/09/2017  . Demand ischemia of myocardium ([place])   . Typical atrial flutter ([place])   . Acute on chronic diastolic heart failure ([place])   . GIB (gastrointestinal bleeding) 08/16/2017  . Hyperlipidemia 08/05/2017  . NSTEMI (non-ST elevated myocardial infarction) ([place])   . Aortic stenosis, severe 07/25/2017  . Pancytopenia ([place]) 07/24/2017  . 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 ([place]) 02/27/2017  . Bradycardia 01/04/2017  . Coronary artery disease 01/04/2017  . Stenosis of carotid artery 01/04/2017  . Obesity 09/18/2016  . Wenckebach block   . DM (diabetes mellitus) type II uncontrolled, periph vascular disorder ([place]) 05/21/2013  . Spinal stenosis of lumbar region 04/08/2013  . Anemia, iron deficiency 11/29/2011  . B12 deficiency anemia 07/30/2011  . OSA (obstructive sleep apnea) 07/06/2010  . CAD, ARTERY BYPASS GRAFT 08/22/2009  . Essential hypertension 03/29/2009  . Bilateral lower extremity edema 02/21/2009  . Hyperlipidemia LDL goal <70 11/26/2008   Past Medical History:  Diagnosis Date  . Anemia   . Arthritis    hips  . Axillary adenopathy 02/25/2017  . Bradycardia  a. holter monitor has demonstrated HRs in 30s and [place]   . CAD (coronary artery disease)    a. s/p CABG 2001  b.  07/28/2017 cath:   Severe three-vessel native CAD with total occlusion of LAD, ramus intermedius, first OM and RCA, patent RIMA to PDA, LIMA to LAD, sequential SVG to ramus intermedius and first OM.    [name] Chronic lower back pain   . Diffuse large B cell lymphoma ([place])   . Diverticulosis   . Esophageal stricture   . GERD  (gastroesophageal reflux disease)   . History of gout   . HTN (hypertension)   . Mixed hyperlipidemia   . OSA on CPAP    with 2L O2 at night  . Pancytopenia ([place])    a. related to chemo therapy for B cell lymphoma  . Peptic stricture of esophagus   . Severe aortic stenosis   . Spinal stenosis   . Type II diabetes mellitus ([place])   . Wears dentures    partial upper    Past Surgical History:  Procedure Laterality Date  . APPENDECTOMY  ~ 1952  . BACK SURGERY    . CATARACT EXTRACTION W/PHACO Left 11/06/2016   Procedure: CATARACT EXTRACTION PHACO AND INTRAOCULAR LENS PLACEMENT (IOC);  Surgeon: Estill Cotta, MD;  Location: [facility];  Service: Ophthalmology;  Laterality: Left;  Lot # [id number] H [place]: 01:09.4 AP%:25.2 CDE: 30.64  . CATARACT EXTRACTION W/PHACO Right 12/04/2016   Procedure: CATARACT EXTRACTION PHACO AND INTRAOCULAR LENS PLACEMENT (IOC);  Surgeon: Estill Cotta, MD;  Location: [facility];  Service: Ophthalmology;  Laterality: Right;  [place] 1:25.9 AP% 24.1 CDE 39.10 Fluid Pack lot # [id number]  . COLONOSCOPY W/ BIOPSIES AND POLYPECTOMY  2013  . CORONARY ANGIOPLASTY  1993  . CORONARY ANGIOPLASTY WITH STENT PLACEMENT  05/1997   "1"  . CORONARY ARTERY BYPASS GRAFT  03/2000   "CABG X5"  . ECTROPION REPAIR Right 09/01/2018   Procedure: REPAIR OF ECTROPION BILATERAL upper and lower;  Surgeon: Karle Starch, MD;  Location: [facility];  Service: Ophthalmology;  Laterality: Right;  Diabetic - oral meds sleep apnea  . ESOPHAGEAL DILATION  X 3-4   Dr. Lyla Son; "last one was in the 1990's"  . ESOPHAGOGASTRODUODENOSCOPY     multiple  . FLEXIBLE SIGMOIDOSCOPY     multiple  . HYDRADENITIS EXCISION Left 02/25/2017   Procedure: EXCISION DEEP LEFT AXILLARY LYMPH NODE;  Surgeon: Fanny Skates, MD;  Location: [facility];  Service: General;  Laterality: Left;  . INTRAOPERATIVE TRANSTHORACIC ECHOCARDIOGRAM N/A 09/09/2017   Procedure: INTRAOPERATIVE TRANSTHORACIC ECHOCARDIOGRAM;   Surgeon: Burnell Blanks, MD;  Location: [facility];  Service: Open Heart Surgery;  Laterality: N/A;  . KNEE ARTHROSCOPY Left 2011   meniscus repair  . LEFT HEART CATHETERIZATION WITH CORONARY/GRAFT ANGIOGRAM N/A 03/16/2014   Procedure: LEFT HEART CATHETERIZATION WITH Beatrix Fetters;  Surgeon: Burnell Blanks, MD;  Location: [facility];  Service: Cardiovascular;  Laterality: N/A;  . LUMBAR LAMINECTOMY/DECOMPRESSION MICRODISCECTOMY Right 06/17/2013   Procedure: LUMBAR LAMINECTOMY MICRODISCECTOMY L4-L5 RIGHT EXCISION OF SYNOVIAL CYST RIGHT   (1 LEVEL) RIGHT PARTIAL FACETECTOMY;  Surgeon: Tobi Bastos, MD;  Location: [facility];  Service: Orthopedics;  Laterality: Right;  . MYELOGRAM  04/06/13   lumbar, Dr Gladstone Lighter  . ORBITAL LESION EXCISION Right 09/01/2018   Procedure: ORBITOTOMY WITHOUT BONE FLAP WITH REMOVAL OF LESION RIGHT;  Surgeon: Karle Starch, MD;  Location: [facility];  Service: Ophthalmology;  Laterality: Right;  . PACEMAKER IMPLANT  N/A 09/10/2017   Procedure: PACEMAKER IMPLANT;  Surgeon: Deboraha Sprang, MD;  Location: [facility];  Service: Cardiovascular;  Laterality: N/A;  . PANENDOSCOPY    . PORTACATH PLACEMENT N/A 03/06/2017   Procedure: INSERTION PORT-A-CATH AND ASPIRATE SEROMA LEFT AXILLA;  Surgeon: Fanny Skates, MD;  Location: [facility];  Service: General;  Laterality: N/A;  . RIGHT/LEFT HEART CATH AND CORONARY/GRAFT ANGIOGRAPHY N/A 07/28/2017   Procedure: RIGHT/LEFT HEART CATH AND CORONARY/GRAFT ANGIOGRAPHY;  Surgeon: Sherren Mocha, MD;  Location: [facility];  Service: Cardiovascular;  Laterality: N/A;  . SHOULDER SURGERY Right 08/2010   screws placed; "tendons tore off"  . SKIN CANCER EXCISION Right    "neck"  . TONSILLECTOMY  ~ 1954  . TRANSCATHETER AORTIC VALVE REPLACEMENT, TRANSFEMORAL N/A 09/09/2017   Procedure: TRANSCATHETER AORTIC VALVE REPLACEMENT, TRANSFEMORAL;  Surgeon: Burnell Blanks, MD;  Location: [facility];  Service:  Open Heart Surgery;  Laterality: N/A;  . UPPER GI ENDOSCOPY  2013   Gastritis; Dr Carlean Purl  . VASECTOMY      No current facility-administered medications for this encounter.   Current Outpatient Medications  Medication Sig Dispense Refill Last Dose  . allopurinol (ZYLOPRIM) 300 MG tablet Take 450 mg by mouth daily.      [name] amLODipine (NORVASC) 5 MG tablet Take 1 tablet BID 180 tablet 1   . amoxicillin (AMOXIL) 500 MG capsule Prior to dentist visit     . apixaban (ELIQUIS) 2.5 MG TABS tablet Take 1 tablet (2.5 mg total) by mouth 2 (two) times daily. 180 tablet 1   . atorvastatin (LIPITOR) 80 MG tablet TAKE ONE TABLET BY MOUTH AT BEDTIME 90 tablet 0   . azelastine (ASTELIN) 0.1 % nasal spray Place 2 sprays into both nostrils at bedtime as needed for rhinitis or allergies. 30 mL 3   . esomeprazole (NEXIUM) 40 MG capsule Take 1 capsule (40 mg total) by mouth daily. 30 capsule 5   . ezetimibe (ZETIA) 10 MG tablet TAKE 1 TABLET BY MOUTH EVERY DAY 90 tablet 3   . fenofibrate 160 MG tablet Take 1 tablet (160 mg total) by mouth daily. 90 tablet 3   . folic acid (FOLVITE) 1 MG tablet TAKE 2 TABLETS BY MOUTH EVERY DAY 180 tablet 4   . furosemide (LASIX) 40 MG tablet TAKE 3 TABLETS BY MOUTH 2 (TWO) TIMES DAILY. 540 tablet 2   . glimepiride (AMARYL) 2 MG tablet Take 1 tablet (2 mg total) by mouth daily with breakfast. 90 tablet 1   . glucose blood (FREESTYLE LITE) test strip Use to test blood sugar once a day.  Dx Code: E11.9 50 strip 1   . KLOR-CON M20 20 MEQ tablet TAKE 2 TABLETS BY MOUTH 2 (TWO) TIMES DAILY. 360 tablet 2   . Lancets (FREESTYLE) lancets USE TWICE A DAY TO CHECK BLOOD SUGAR. DX E11.9 100 each 6   . metFORMIN (GLUCOPHAGE) 1000 MG tablet TAKE 1 & 1/2 TABLETS BY MOUTH EVERY MORNING AND 1 TABLET IN THE EVENING. 225 tablet 1   . metoprolol tartrate (LOPRESSOR) 25 MG tablet TAKE ONE-HALF TABLET BY MOUTH TWICE DAILY 90 tablet 3   . nitroGLYCERIN (NITROSTAT) 0.4 MG SL tablet PLACE 1 TABLET (0.4  MG TOTAL) UNDER THE TONGUE EVERY 5 (FIVE) MINUTES AS NEEDED FOR CHEST PAIN. (Patient not taking: Reported on 04/27/2019) 25 tablet 3   . polycarbophil (FIBERCON) 625 MG tablet Take 625 mg by mouth daily.     . pregabalin (LYRICA) 150 MG  capsule Take 1 capsule (150 mg total) by mouth at bedtime. 90 capsule 1   . pregabalin (LYRICA) 75 MG capsule Take 1 po q am and 1 po mid day 180 capsule 1   . psyllium (METAMUCIL) 58.6 % powder Take 1 packet by mouth 3 (three) times daily.     [name] VASCEPA 1 g CAPS TAKE 2 CAPSULES BY MOUTH TWICE A DAY 120 capsule 3    Facility-Administered Medications Ordered in Other Encounters  Medication Dose Route Frequency Provider Last Rate Last Admin  . sodium chloride flush (NS) 0.9 % injection 10 mL  10 mL Intravenous PRN [name], MD   10 mL at 05/30/17 0920   Allergies  Allergen Reactions  . Benazepril Swelling    angioedema; he is not a candidate for any angiotensin receptor blockers because of this significant allergic reaction. Because of a history of documented adverse serious drug reaction;Medi Alert bracelet  is recommended  . Hctz [Hydrochlorothiazide] Anaphylaxis and Swelling    Tongue and lip swelling   . Aspirin Other (See Comments)    Gastritis, cant take 325 Mg aspirin   . Lactose Intolerance (Gi) Nausea And Vomiting    Social History   Tobacco Use  . Smoking status: Never Smoker  . Smokeless tobacco: Never Used  Substance Use Topics  . Alcohol use: No    Alcohol/week: 0.0 standard drinks    Comment: "last drink was in 2012"( 03/15/2014)    Family History  Problem Relation Age of Onset  . Stroke Father   . Hypertension Father   . Pancreatic cancer Mother   . Diabetes Maternal Grandmother   . Stroke Maternal Grandmother   . Heart attack Paternal Grandmother   . Colon cancer Neg Hx   . Esophageal cancer Neg Hx   . Rectal cancer Neg Hx   . Stomach cancer Neg Hx   . Ulcers Neg Hx      Review of Systems  Constitutional: Negative.    HENT: Negative.   Eyes: Negative.   Respiratory: Negative.   Cardiovascular: Negative.   Gastrointestinal: Negative.   Genitourinary: Negative.   Musculoskeletal: Positive for joint pain.  Skin: Negative.   Neurological: Negative.   Endo/Heme/Allergies: Positive for environmental allergies.  Psychiatric/Behavioral: Negative.      Objective:  Physical Exam  Constitutional: He is oriented to person, place, and time. He appears well-developed.  HENT:  Head: Normocephalic.  Eyes: Pupils are equal, round, and reactive to light.  Neck: No JVD present. No tracheal deviation present. No thyromegaly present.  Cardiovascular: Normal rate, regular rhythm and intact distal pulses.  Murmur (value replaced) heard. Respiratory: Effort normal and breath sounds normal. No respiratory distress. He has no wheezes.  GI: Soft. There is no abdominal tenderness. There is no guarding.  Musculoskeletal:     Cervical back: Neck supple.     Left knee: Swelling and bony tenderness present. No deformity, erythema, ecchymosis or lacerations. Decreased range of motion. Tenderness present.  Lymphadenopathy:    He has no cervical adenopathy.  Neurological: He is alert and oriented to person, place, and time. A sensory deficit (bilateral LE neuropathy) is present.  Skin: Skin is warm and dry.  Psychiatric: He has a normal mood and affect.      Labs:  Estimated body mass index is 34.6 kg/m as calculated from the following:   Height as of 04/22/19: 6' (1.829 m).   Weight as of 04/27/19: 115.7 kg.   Imaging Review Plain radiographs demonstrate  severe degenerative joint disease of the left knee. The overall alignment is mild varus. The bone quality appears to be good for age and reported activity level.      Assessment/Plan:  End stage arthritis, left knee   The patient history, physical examination, clinical judgment of the provider and imaging studies are consistent with end stage  degenerative joint disease of the left knee(s) and total knee arthroplasty is deemed medically necessary. The treatment options including medical management, injection therapy arthroscopy and arthroplasty were discussed at length. The risks and benefits of total knee arthroplasty were presented and reviewed. The risks due to aseptic loosening, infection, stiffness, patella tracking problems, thromboembolic complications and other imponderables were discussed. The patient acknowledged the explanation, agreed to proceed with the plan and consent was signed. Patient is being admitted for inpatient treatment for surgery, pain control, PT, OT, prophylactic antibiotics, VTE prophylaxis, progressive ambulation and ADL's and discharge planning. The patient is planning to be discharged home.    Anticipated LOS equal to or greater than 2 midnights due to - Age 47 and older with one or more of the following:  - Obesity  - Expected need for hospital services (PT, OT, Nursing) required for safe  discharge  - Anticipated need for postoperative skilled nursing care or inpatient rehab  - Active co-morbidities: Diabetes, Coronary Artery Disease, Heart Attack and Cardiac Arrhythmia     [place]ugh. Klein Willcox   PA-C  06/10/2019, 9:29 AM

## 2019-06-16 ENCOUNTER — Other Ambulatory Visit: Payer: Self-pay | Admitting: Cardiology

## 2019-06-18 ENCOUNTER — Encounter: Payer: Self-pay | Admitting: Hematology & Oncology

## 2019-06-18 ENCOUNTER — Other Ambulatory Visit: Payer: Self-pay | Admitting: Family

## 2019-06-18 ENCOUNTER — Inpatient Hospital Stay: Payer: Medicare Other

## 2019-06-18 ENCOUNTER — Inpatient Hospital Stay: Payer: Medicare Other | Attending: Hematology & Oncology

## 2019-06-18 ENCOUNTER — Inpatient Hospital Stay (HOSPITAL_BASED_OUTPATIENT_CLINIC_OR_DEPARTMENT_OTHER): Payer: Medicare Other | Admitting: Hematology & Oncology

## 2019-06-18 ENCOUNTER — Other Ambulatory Visit: Payer: Self-pay

## 2019-06-18 VITALS — BP 124/58 | HR 61 | Temp 97.6°F | Resp 19 | Wt 260.0 lb

## 2019-06-18 DIAGNOSIS — Z7984 Long term (current) use of oral hypoglycemic drugs: Secondary | ICD-10-CM | POA: Diagnosis not present

## 2019-06-18 DIAGNOSIS — Z7901 Long term (current) use of anticoagulants: Secondary | ICD-10-CM | POA: Insufficient documentation

## 2019-06-18 DIAGNOSIS — C8332 Diffuse large B-cell lymphoma, intrathoracic lymph nodes: Secondary | ICD-10-CM | POA: Insufficient documentation

## 2019-06-18 DIAGNOSIS — D519 Vitamin B12 deficiency anemia, unspecified: Secondary | ICD-10-CM

## 2019-06-18 DIAGNOSIS — D518 Other vitamin B12 deficiency anemias: Secondary | ICD-10-CM | POA: Diagnosis not present

## 2019-06-18 DIAGNOSIS — D510 Vitamin B12 deficiency anemia due to intrinsic factor deficiency: Secondary | ICD-10-CM | POA: Diagnosis not present

## 2019-06-18 DIAGNOSIS — D508 Other iron deficiency anemias: Secondary | ICD-10-CM | POA: Insufficient documentation

## 2019-06-18 DIAGNOSIS — D500 Iron deficiency anemia secondary to blood loss (chronic): Secondary | ICD-10-CM | POA: Diagnosis not present

## 2019-06-18 DIAGNOSIS — E785 Hyperlipidemia, unspecified: Secondary | ICD-10-CM | POA: Insufficient documentation

## 2019-06-18 DIAGNOSIS — I6523 Occlusion and stenosis of bilateral carotid arteries: Secondary | ICD-10-CM | POA: Diagnosis not present

## 2019-06-18 DIAGNOSIS — I10 Essential (primary) hypertension: Secondary | ICD-10-CM | POA: Diagnosis not present

## 2019-06-18 DIAGNOSIS — Z79899 Other long term (current) drug therapy: Secondary | ICD-10-CM | POA: Insufficient documentation

## 2019-06-18 DIAGNOSIS — M109 Gout, unspecified: Secondary | ICD-10-CM | POA: Diagnosis not present

## 2019-06-18 DIAGNOSIS — K219 Gastro-esophageal reflux disease without esophagitis: Secondary | ICD-10-CM | POA: Diagnosis not present

## 2019-06-18 DIAGNOSIS — E119 Type 2 diabetes mellitus without complications: Secondary | ICD-10-CM | POA: Diagnosis not present

## 2019-06-18 LAB — CBC WITH DIFFERENTIAL (CANCER CENTER ONLY)
Abs Immature Granulocytes: 0.08 10*3/uL — ABNORMAL HIGH (ref 0.00–0.07)
Basophils Absolute: 0.1 10*3/uL (ref 0.0–0.1)
Basophils Relative: 1 %
Eosinophils Absolute: 0.1 10*3/uL (ref 0.0–0.5)
Eosinophils Relative: 1 %
HCT: 37.1 % — ABNORMAL LOW (ref 39.0–52.0)
Hemoglobin: 11.6 g/dL — ABNORMAL LOW (ref 13.0–17.0)
Immature Granulocytes: 1 %
Lymphocytes Relative: 23 %
Lymphs Abs: 1.6 10*3/uL (ref 0.7–4.0)
MCH: 27.8 pg (ref 26.0–34.0)
MCHC: 31.3 g/dL (ref 30.0–36.0)
MCV: 89 fL (ref 80.0–100.0)
Monocytes Absolute: 0.6 10*3/uL (ref 0.1–1.0)
Monocytes Relative: 9 %
Neutro Abs: 4.5 10*3/uL (ref 1.7–7.7)
Neutrophils Relative %: 65 %
Platelet Count: 185 10*3/uL (ref 150–400)
RBC: 4.17 MIL/uL — ABNORMAL LOW (ref 4.22–5.81)
RDW: 16.4 % — ABNORMAL HIGH (ref 11.5–15.5)
WBC Count: 6.9 10*3/uL (ref 4.0–10.5)
nRBC: 0 % (ref 0.0–0.2)

## 2019-06-18 LAB — CMP (CANCER CENTER ONLY)
ALT: 12 U/L (ref 0–44)
AST: 17 U/L (ref 15–41)
Albumin: 4.6 g/dL (ref 3.5–5.0)
Alkaline Phosphatase: 56 U/L (ref 38–126)
Anion gap: 10 (ref 5–15)
BUN: 30 mg/dL — ABNORMAL HIGH (ref 8–23)
CO2: 26 mmol/L (ref 22–32)
Calcium: 9.6 mg/dL (ref 8.9–10.3)
Chloride: 100 mmol/L (ref 98–111)
Creatinine: 1.74 mg/dL — ABNORMAL HIGH (ref 0.61–1.24)
GFR, Est AFR Am: 42 mL/min — ABNORMAL LOW (ref >60)
GFR, Estimated: 36 mL/min — ABNORMAL LOW (ref >60)
Glucose, Bld: 130 mg/dL — ABNORMAL HIGH (ref 70–99)
Potassium: 3.9 mmol/L (ref 3.5–5.1)
Sodium: 136 mmol/L (ref 135–145)
Total Bilirubin: 0.6 mg/dL (ref 0.3–1.2)
Total Protein: 6.9 g/dL (ref 6.5–8.1)

## 2019-06-18 LAB — RETICULOCYTES
Immature Retic Fract: 28.3 % — ABNORMAL HIGH (ref 2.3–15.9)
RBC.: 4.12 MIL/uL — ABNORMAL LOW (ref 4.22–5.81)
Retic Count, Absolute: 65.5 10*3/uL (ref 19.0–186.0)
Retic Ct Pct: 1.6 % (ref 0.4–3.1)

## 2019-06-18 MED ORDER — SODIUM CHLORIDE 0.9% FLUSH
10.0000 mL | INTRAVENOUS | Status: DC | PRN
  Administered 2019-06-18: 10 mL
  Filled 2019-06-18: qty 10

## 2019-06-18 MED ORDER — HEPARIN SOD (PORK) LOCK FLUSH 100 UNIT/ML IV SOLN
500.0000 [IU] | Freq: Once | INTRAVENOUS | Status: AC | PRN
  Administered 2019-06-18: 500 [IU]
  Filled 2019-06-18: qty 5

## 2019-06-18 MED ORDER — DENOSUMAB 120 MG/1.7ML ~~LOC~~ SOLN
SUBCUTANEOUS | Status: AC
  Filled 2019-06-18: qty 1.7

## 2019-06-18 MED ORDER — CYANOCOBALAMIN 1000 MCG/ML IJ SOLN
INTRAMUSCULAR | Status: AC
  Filled 2019-06-18: qty 1

## 2019-06-18 MED ORDER — CYANOCOBALAMIN 1000 MCG/ML IJ SOLN
1000.0000 ug | Freq: Once | INTRAMUSCULAR | Status: AC
  Administered 2019-06-18: 1000 ug via INTRAMUSCULAR

## 2019-06-18 MED ORDER — DENOSUMAB 120 MG/1.7ML ~~LOC~~ SOLN
120.0000 mg | Freq: Once | SUBCUTANEOUS | Status: AC
  Administered 2019-06-18: 120 mg via SUBCUTANEOUS

## 2019-06-18 NOTE — Progress Notes (Signed)
Hematology and Oncology Follow Up Visit  Richard [name] [id number] 10/02/1940 78 y.o. 06/18/2019   Principle Diagnosis:  Diffuse large cell non-Hodgkin's lymphoma (IPI = 3) - NOT "double hit" Pernicious anemia Iron deficiency secondary to bleeding  Current Therapy:   R-CHOP - s/p cycle#8 - completed 08/2017 Vitamin B12 1 mg IM every month Xgeva 120 mg subcu q 3 months - next dose in 08/2019 IV iron with Feraheme.  Last dose given 05/2019   Interim History:  Richard [name] is here today for follow-up.  He looks fantastic.  He is ready for his left knee surgery.  This is can be done on December 29.  Otherwise, he really has had no complaints.  He feels quite well.  He has had no issues with pain outside of his left knee.  He has had no change in bowel or bladder habits.  He has had no problems with his diabetes.  His renal function is on the higher side.  We have to be careful with this.  I am sure that his family doctor is monitoring this closely.  When we last saw him back in October, his ferritin was 737 with an iron saturation of 20%.  He did get a dose of iron at that time.  He feels so well.  He found out that he will have knee surgery for his left knee on December 29.  He has bad osteoarthritis of the left knee.  He has done incredibly well with respect to the vitamin B12 and the IV iron.  He has been maintaining his blood counts.  I am a little bit worried about his renal function.  I suspect that he is going to have a very low erythropoietin level.  He has had no obvious bleeding.  He has had no issues with bowels or bladder.  He has not noted any palpable lymph nodes.  There is been no cough.  I see no problems with respect to his lymphoma recurring.  He gets his [place] today.  Overall, his performance status is ECOG 1.  Medications:  Allergies as of 06/18/2019      Reactions   Benazepril Swelling   angioedema; he is not a candidate for any angiotensin receptor  blockers because of this significant allergic reaction. Because of a history of documented adverse serious drug reaction;Medi Alert bracelet  is recommended   Hctz [hydrochlorothiazide] Anaphylaxis, Swelling   Tongue and lip swelling   Aspirin Other (See Comments)   Gastritis, cant take 325 Mg aspirin    Lactose Intolerance (gi) Nausea And Vomiting      Medication List       Accurate as of June 18, 2019 11:49 AM. If you have any questions, ask your nurse or doctor.        acetaminophen 500 MG tablet Commonly known as: TYLENOL Take 1,000 mg by mouth at bedtime as needed for moderate pain or headache.   allopurinol 300 MG tablet Commonly known as: ZYLOPRIM Take 450 mg by mouth daily.   aluminum hydroxide-magnesium carbonate 95-358 MG/15ML Susp Commonly known as: GAVISCON Take 15 mLs by mouth as needed for indigestion or heartburn.   amLODipine 5 MG tablet Commonly known as: NORVASC TAKE 1 TABLET BY MOUTH TWICE A DAY   apixaban 2.5 MG Tabs tablet Commonly known as: ELIQUIS Take 1 tablet (2.5 mg total) by mouth 2 (two) times daily.   atorvastatin 80 MG tablet Commonly known as: LIPITOR TAKE ONE TABLET BY MOUTH AT BEDTIME  azelastine 0.1 % nasal spray Commonly known as: ASTELIN Place 2 sprays into both nostrils at bedtime as needed for rhinitis or allergies.   esomeprazole 40 MG capsule Commonly known as: NexIUM Take 1 capsule (40 mg total) by mouth daily.   ezetimibe 10 MG tablet Commonly known as: ZETIA TAKE 1 TABLET BY MOUTH EVERY DAY   fenofibrate 160 MG tablet Take 1 tablet (160 mg total) by mouth daily.   folic acid 1 MG tablet Commonly known as: FOLVITE TAKE 2 TABLETS BY MOUTH EVERY DAY   freestyle lancets USE TWICE A DAY TO CHECK BLOOD SUGAR. DX E11.9   FREESTYLE LITE test strip Generic drug: glucose blood Use to test blood sugar once a day.  Dx Code: E11.9   furosemide 40 MG tablet Commonly known as: LASIX TAKE 3 TABLETS BY MOUTH 2 (TWO)  TIMES DAILY. What changed: See the new instructions.   glimepiride 2 MG tablet Commonly known as: AMARYL Take 1 tablet (2 mg total) by mouth daily with breakfast.   Klor-Con M20 20 MEQ tablet Generic drug: potassium chloride SA TAKE 2 TABLETS BY MOUTH 2 (TWO) TIMES DAILY. What changed: See the new instructions.   metFORMIN 1000 MG tablet Commonly known as: GLUCOPHAGE TAKE 1 & 1/2 TABLETS BY MOUTH EVERY MORNING AND 1 TABLET IN THE EVENING. What changed: See the new instructions.   metoprolol tartrate 25 MG tablet Commonly known as: LOPRESSOR TAKE ONE-HALF TABLET BY MOUTH TWICE DAILY   nitroGLYCERIN 0.4 MG SL tablet Commonly known as: NITROSTAT PLACE 1 TABLET (0.4 MG TOTAL) UNDER THE TONGUE EVERY 5 (FIVE) MINUTES AS NEEDED FOR CHEST PAIN.   polycarbophil 625 MG tablet Commonly known as: FIBERCON Take 625 mg by mouth daily.   pregabalin 75 MG capsule Commonly known as: Lyrica Take 1 po q am and 1 po mid day What changed:   how much to take  how to take this  when to take this  additional instructions   pregabalin 150 MG capsule Commonly known as: Lyrica Take 1 capsule (150 mg total) by mouth at bedtime. What changed: Another medication with the same name was changed. Make sure you understand how and when to take each.   psyllium 58.6 % powder Commonly known as: METAMUCIL Take 1 packet by mouth daily as needed (constipation).   Vascepa 1 g capsule Generic drug: icosapent Ethyl TAKE 2 CAPSULES BY MOUTH TWICE A DAY What changed: how much to take       Allergies:  Allergies  Allergen Reactions  . Benazepril Swelling    angioedema; he is not a candidate for any angiotensin receptor blockers because of this significant allergic reaction. Because of a history of documented adverse serious drug reaction;Medi Alert bracelet  is recommended  . Hctz [Hydrochlorothiazide] Anaphylaxis and Swelling    Tongue and lip swelling   . Aspirin Other (See Comments)     Gastritis, cant take 325 Mg aspirin   . Lactose Intolerance (Gi) Nausea And Vomiting    Past Medical History, Surgical history, Social history, and Family History were reviewed and updated.  Review of Systems: Review of Systems  Constitutional: Negative.   HENT: Positive for congestion.   Eyes: Negative.   Respiratory: Negative.   Cardiovascular: Negative.   Gastrointestinal: Negative.   Genitourinary: Negative.   Musculoskeletal: Negative.   Skin: Negative.   Neurological: Negative.   Endo/Heme/Allergies: Negative.      Physical Exam:  weight is 260 lb (117.9 kg). His temporal temperature is 97.6 F (  36.4 C). His blood pressure is 124/58 (abnormal) and his pulse is 61. His respiration is 19 and oxygen saturation is 97%.   Wt Readings from Last 3 Encounters:  06/18/19 260 lb (117.9 kg)  04/27/19 255 lb 1.9 oz (115.7 kg)  04/22/19 253 lb 12.8 oz (115.1 kg)    Physical Exam Vitals reviewed.  HENT:     Head: Normocephalic and atraumatic.  Eyes:     Pupils: Pupils are equal, round, and reactive to light.  Cardiovascular:     Rate and Rhythm: Normal rate and regular rhythm.     Heart sounds: Normal heart sounds.  Pulmonary:     Effort: Pulmonary effort is normal.     Breath sounds: Normal breath sounds.  Abdominal:     General: Bowel sounds are normal.     Palpations: Abdomen is soft.  Musculoskeletal:        General: No tenderness or deformity. Normal range of motion.     Cervical back: Normal range of motion.  Lymphadenopathy:     Cervical: No cervical adenopathy.  Skin:    General: Skin is warm and dry.     Findings: No erythema or rash.  Neurological:     Mental Status: He is alert and oriented to person, place, and time.  Psychiatric:        Behavior: Behavior normal.        Thought Content: Thought content normal.        Judgment: Judgment normal.     Lab Results  Component Value Date   WBC 6.9 06/18/2019   HGB 11.6 (L) 06/18/2019   HCT 37.1 (L)  06/18/2019   MCV 89.0 06/18/2019   PLT 185 06/18/2019   Lab Results  Component Value Date   FERRITIN 737 (H) 04/27/2019   IRON 69 04/27/2019   TIBC 343 04/27/2019   UIBC 273 04/27/2019   IRONPCTSAT 20 04/27/2019   Lab Results  Component Value Date   RETICCTPCT 1.6 06/18/2019   RBC 4.12 (L) 06/18/2019   RBC 4.17 (L) 06/18/2019   RETICCTABS 52.1 11/22/2011   No results found for: Nils Pyle [facility] Lab Results  Component Value Date   IGA 159 03/09/2012   Lab Results  Component Value Date   ALBUMINELP 4.2 07/27/2018   MSPIKE Not Observed 07/27/2018     Chemistry      Component Value Date/Time   NA 136 06/18/2019 1036   NA 136 03/16/2019 0938   NA 144 06/27/2017 0857   K 3.9 06/18/2019 1036   K 3.9 06/27/2017 0857   CL 100 06/18/2019 1036   CL 103 06/27/2017 0857   CO2 26 06/18/2019 1036   CO2 26 06/27/2017 0857   BUN 30 (H) 06/18/2019 1036   BUN 34 (H) 03/16/2019 0938   BUN 12 06/27/2017 0857   CREATININE 1.74 (H) 06/18/2019 1036   CREATININE 1.0 06/27/2017 0857      Component Value Date/Time   CALCIUM 9.6 06/18/2019 1036   CALCIUM 9.2 06/27/2017 0857   ALKPHOS 56 06/18/2019 1036   ALKPHOS 128 (H) 06/27/2017 0857   AST 17 06/18/2019 1036   ALT 12 06/18/2019 1036   ALT 24 06/27/2017 0857   BILITOT 0.6 06/18/2019 1036      Impression and Plan: Mr. Skelton is a very pleasant 78 yo caucasian gentleman with diffuse large B-cell lymphoma (not double hit lymphoma).   As far as the lymphoma goes, everything looks quite good.  It is been 2 years that  he has had his treatments.  I do not see anything that looks like recurrence.  I do not see any problems with him having his knee operated on.  His blood counts look good.  He should have no problems with recovery.  He should have no problems with bleeding.  We will plan to have him come back to see [place] in about 2 months or so.  I want to make sure that he is able to get to the office after his  surgery without a lot of pain and trepidation.   [name], MD 12/18/202011:49 AM

## 2019-06-18 NOTE — Patient Instructions (Signed)

## 2019-06-18 NOTE — Patient Instructions (Signed)
Cyanocobalamin, Vitamin B12 injection What is this medicine? CYANOCOBALAMIN (sye an oh koe BAL a min) is a man made form of vitamin B12. Vitamin B12 is used in the growth of healthy blood cells, nerve cells, and proteins in the body. It also helps with the metabolism of fats and carbohydrates. This medicine is used to treat people who can not absorb vitamin B12. This medicine may be used for other purposes; ask your health care provider or pharmacist if you have questions. COMMON BRAND NAME(S): B-12 Compliance Kit, B-12 Injection Kit, Cyomin, LA-12, Nutri-Twelve, Physicians EZ Use B-12, Primabalt What should I tell my health care provider before I take this medicine? They need to know if you have any of these conditions:  kidney disease  Leber's disease  megaloblastic anemia  an unusual or allergic reaction to cyanocobalamin, cobalt, other medicines, foods, dyes, or preservatives  pregnant or trying to get pregnant  breast-feeding How should I use this medicine? This medicine is injected into a muscle or deeply under the skin. It is usually given by a health care professional in a clinic or doctor's office. However, your doctor may teach you how to inject yourself. Follow all instructions. Talk to your pediatrician regarding the use of this medicine in children. Special care may be needed. Overdosage: If you think you have taken too much of this medicine contact a poison control center or emergency room at once. NOTE: This medicine is only for you. Do not share this medicine with others. What if I miss a dose? If you are given your dose at a clinic or doctor's office, call to reschedule your appointment. If you give your own injections and you miss a dose, take it as soon as you can. If it is almost time for your next dose, take only that dose. Do not take double or extra doses. What may interact with this medicine?  colchicine  heavy alcohol intake This list may not describe all  possible interactions. Give your health care provider a list of all the medicines, herbs, non-prescription drugs, or dietary supplements you use. Also tell them if you smoke, drink alcohol, or use illegal drugs. Some items may interact with your medicine. What should I watch for while using this medicine? Visit your doctor or health care professional regularly. You may need blood work done while you are taking this medicine. You may need to follow a special diet. Talk to your doctor. Limit your alcohol intake and avoid smoking to get the best benefit. What side effects may I notice from receiving this medicine? Side effects that you should report to your doctor or health care professional as soon as possible:  allergic reactions like skin rash, itching or hives, swelling of the face, lips, or tongue  blue tint to skin  chest tightness, pain  difficulty breathing, wheezing  dizziness  red, swollen painful area on the leg Side effects that usually do not require medical attention (report to your doctor or health care professional if they continue or are bothersome):  diarrhea  headache This list may not describe all possible side effects. Call your doctor for medical advice about side effects. You may report side effects to FDA at 1-800-FDA-1088. Where should I keep my medicine? Keep out of the reach of children. Store at room temperature between 15 and 30 degrees C (59 and 85 degrees F). Protect from light. Throw away any unused medicine after the expiration date. NOTE: This sheet is a summary. It may not cover  all possible information. If you have questions about this medicine, talk to your doctor, pharmacist, or health care provider.  2020 Elsevier/Gold Standard (2007-09-28 22:10:20)  Denosumab injection Delton See) What is this medicine? DENOSUMAB (den oh sue mab) slows bone breakdown. Prolia is used to treat osteoporosis in women after menopause and in men, and in people who are  taking corticosteroids for 6 months or more. Delton See is used to treat a high calcium level due to cancer and to prevent bone fractures and other bone problems caused by multiple myeloma or cancer bone metastases. Delton See is also used to treat giant cell tumor of the bone. This medicine may be used for other purposes; ask your health care provider or pharmacist if you have questions. COMMON BRAND NAME(S): Prolia, XGEVA What should I tell my health care provider before I take this medicine? They need to know if you have any of these conditions:  dental disease  having surgery or tooth extraction  infection  kidney disease  low levels of calcium or Vitamin D in the blood  malnutrition  on hemodialysis  skin conditions or sensitivity  thyroid or parathyroid disease  an unusual reaction to denosumab, other medicines, foods, dyes, or preservatives  pregnant or trying to get pregnant  breast-feeding How should I use this medicine? This medicine is for injection under the skin. It is given by a health care professional in a hospital or clinic setting. A special MedGuide will be given to you before each treatment. Be sure to read this information carefully each time. For Prolia, talk to your pediatrician regarding the use of this medicine in children. Special care may be needed. For Delton See, talk to your pediatrician regarding the use of this medicine in children. While this drug may be prescribed for children as young as 13 years for selected conditions, precautions do apply. Overdosage: If you think you have taken too much of this medicine contact a poison control center or emergency room at once. NOTE: This medicine is only for you. Do not share this medicine with others. What if I miss a dose? It is important not to miss your dose. Call your doctor or health care professional if you are unable to keep an appointment. What may interact with this medicine? Do not take this medicine with any  of the following medications:  other medicines containing denosumab This medicine may also interact with the following medications:  medicines that lower your chance of fighting infection  steroid medicines like prednisone or cortisone This list may not describe all possible interactions. Give your health care provider a list of all the medicines, herbs, non-prescription drugs, or dietary supplements you use. Also tell them if you smoke, drink alcohol, or use illegal drugs. Some items may interact with your medicine. What should I watch for while using this medicine? Visit your doctor or health care professional for regular checks on your progress. Your doctor or health care professional may order blood tests and other tests to see how you are doing. Call your doctor or health care professional for advice if you get a fever, chills or sore throat, or other symptoms of a cold or flu. Do not treat yourself. This drug may decrease your body's ability to fight infection. Try to avoid being around people who are sick. You should make sure you get enough calcium and vitamin D while you are taking this medicine, unless your doctor tells you not to. Discuss the foods you eat and the vitamins you take  with your health care professional. See your dentist regularly. Brush and floss your teeth as directed. Before you have any dental work done, tell your dentist you are receiving this medicine. Do not become pregnant while taking this medicine or for 5 months after stopping it. Talk with your doctor or health care professional about your birth control options while taking this medicine. Women should inform their doctor if they wish to become pregnant or think they might be pregnant. There is a potential for serious side effects to an unborn child. Talk to your health care professional or pharmacist for more information. What side effects may I notice from receiving this medicine? Side effects that you should  report to your doctor or health care professional as soon as possible:  allergic reactions like skin rash, itching or hives, swelling of the face, lips, or tongue  bone pain  breathing problems  dizziness  jaw pain, especially after dental work  redness, blistering, peeling of the skin  signs and symptoms of infection like fever or chills; cough; sore throat; pain or trouble passing urine  signs of low calcium like fast heartbeat, muscle cramps or muscle pain; pain, tingling, numbness in the hands or feet; seizures  unusual bleeding or bruising  unusually weak or tired Side effects that usually do not require medical attention (report to your doctor or health care professional if they continue or are bothersome):  constipation  diarrhea  headache  joint pain  loss of appetite  muscle pain  runny nose  tiredness  upset stomach This list may not describe all possible side effects. Call your doctor for medical advice about side effects. You may report side effects to FDA at 1-800-FDA-1088. Where should I keep my medicine? This medicine is only given in a clinic, doctor's office, or other health care setting and will not be stored at home. NOTE: This sheet is a summary. It may not cover all possible information. If you have questions about this medicine, talk to your doctor, pharmacist, or health care provider.  2020 Elsevier/Gold Standard (2017-10-24 16:10:44)

## 2019-06-21 ENCOUNTER — Encounter: Payer: Self-pay | Admitting: *Deleted

## 2019-06-21 LAB — ERYTHROPOIETIN: Erythropoietin: 40 m[IU]/mL — ABNORMAL HIGH (ref 2.6–18.5)

## 2019-06-21 LAB — IRON AND TIBC
Iron: 75 ug/dL (ref 42–163)
Saturation Ratios: 21 % (ref 20–55)
TIBC: 356 ug/dL (ref 202–409)
UIBC: 282 ug/dL (ref 117–376)

## 2019-06-21 LAB — FERRITIN: Ferritin: 646 ng/mL — ABNORMAL HIGH (ref 24–336)

## 2019-06-21 NOTE — Patient Instructions (Addendum)
DUE TO COVID-19 ONLY ONE VISITOR IS ALLOWED TO COME WITH YOU AND STAY IN THE WAITING ROOM ONLY DURING PRE OP AND PROCEDURE DAY OF SURGERY. THE 1 VISITOR MAY VISIT WITH YOU AFTER SURGERY IN YOUR PRIVATE ROOM DURING VISITING HOURS ONLY!  YOU NEED TO HAVE A COVID 19 TEST ON_ Saturday 12/26/2020______ @__  10:50 am_____, THIS TEST MUST BE DONE BEFORE SURGERY, COME  [name] , [id number].  ([place]) ONCE YOUR COVID TEST IS COMPLETED, PLEASE BEGIN THE QUARANTINE INSTRUCTIONS AS OUTLINED IN YOUR HANDOUT.                [name]     Your procedure is scheduled on: Tuesday 06/29/2019   Report to [facility] Main  Entrance    Report to admitting at 0735  AM                Please bring your CPAP mask and tubing with you to the hospital!    How to Manage Your Diabetes Before and After Surgery  Why is it important to control my blood sugar before and after surgery? . Improving blood sugar levels before and after surgery helps healing and can limit problems. . A way of improving blood sugar control is eating a healthy diet by: o  Eating less sugar and carbohydrates o  Increasing activity/exercise o  Talking with your doctor about reaching your blood sugar goals . High blood sugars (greater than 180 mg/dL) can raise your risk of infections and slow your recovery, so you will need to focus on controlling your diabetes during the weeks before surgery. . Make sure that the doctor who takes care of your diabetes knows about your planned surgery including the date and location.  How do I manage my blood sugar before surgery? . Check your blood sugar at least 4 times a day, starting 2 days before surgery, to make sure that the level is not too high or low. o Check your blood sugar the morning of your surgery when you wake up and every 2 hours until you get to the Short Stay unit. . If your blood sugar is less than 70 mg/dL, you will need to treat for  low blood sugar: o Do not take insulin. o Treat a low blood sugar (less than 70 mg/dL) with  cup of clear juice (cranberry or apple), 4 glucose tablets, OR glucose gel. o Recheck blood sugar in 15 minutes after treatment (to make sure it is greater than 70 mg/dL). If your blood sugar is not greater than 70 mg/dL on recheck, call [phone] for further instructions. . Report your blood sugar to the short stay nurse when you get to Short Stay.  . If you are admitted to the hospital after surgery: o Your blood sugar will be checked by the staff and you will probably be given insulin after surgery (instead of oral diabetes medicines) to make sure you have good blood sugar levels. o The goal for blood sugar control after surgery is 80-180 mg/dL.   WHAT DO I DO ABOUT MY DIABETES MEDICATION?        The day before surgery, Take the MORNING DOSE ONLY OF Glimepiride (Amaryl)!        The day before surgery, Take Metformin as prescribed!  . Do not take oral diabetes medicines (pills) the morning of surgery.      Call this number if you have problems the morning of surgery [phone]  Remember: Do not eat food :After Midnight.    NO SOLID FOOD AFTER MIDNIGHT THE NIGHT PRIOR TO SURGERY. NOTHING BY MOUTH EXCEPT CLEAR LIQUIDS UNTIL   0705 am .     PLEASE FINISH Gatorade  Zero  DRINK PER SURGEON ORDER  WHICH NEEDS TO BE COMPLETED AT  0705 am .   CLEAR LIQUID DIET   Foods Allowed                                                                     Foods Excluded  Coffee and tea, regular and decaf                             liquids that you cannot  Plain Jell-O any favor except red or purple                                           see through such as: Fruit ices (not with fruit pulp)                                     milk, soups, orange juice  Iced Popsicles                                    All solid food Carbonated beverages, regular and diet                                     Cranberry, grape and apple juices Sports drinks like Gatorade Lightly seasoned clear broth or consume(fat free) Sugar, honey syrup  Sample Menu Breakfast                                Lunch                                     Supper Cranberry juice                    Beef broth                            Chicken broth Jell-O                                     Grape juice                           Apple juice Coffee or tea                        Jell-O  Popsicle                                                Coffee or tea                        Coffee or tea  _____________________________________________________________________       BRUSH YOUR TEETH MORNING OF SURGERY AND RINSE YOUR MOUTH OUT, NO CHEWING GUM CANDY OR MINTS.     Take these medicines the morning of surgery with A SIP OF WATER: Pregabalin (Lyrica), Metoprolol tartrate (Lopressor), Amlodipine (Norvasc), Allopurinol (Zyloprim), Esomeprazole (Nexium)   DO NOT TAKE ANY DIABETIC MEDICATIONS DAY OF YOUR SURGERY!                               You may not have any metal on your body including hair pins and              piercings  Do not wear jewelry, make-up, lotions, powders or perfumes, deodorant                          Men may shave face and neck.   Do not bring valuables to the hospital. [name].  Contacts, dentures or bridgework may not be worn into surgery.  Leave suitcase in the car. After surgery it may be brought to your room.                  Please read over the following fact sheets you were given: _____________________________________________________________________             [name] - Preparing for Surgery Before surgery, you can play an important role.  Because skin is not sterile, your skin needs to be as free of germs as possible.  You can reduce the number of germs on your skin by washing with CHG  (chlorahexidine gluconate) soap before surgery.  CHG is an antiseptic cleaner which kills germs and bonds with the skin to continue killing germs even after washing. Please DO NOT use if you have an allergy to CHG or antibacterial soaps.  If your skin becomes reddened/irritated stop using the CHG and inform your nurse when you arrive at Short Stay. Do not shave (including legs and underarms) for at least 48 hours prior to the first CHG shower.  You may shave your face/neck. Please follow these instructions carefully:  1.  Shower with CHG Soap the night before surgery and the  morning of Surgery.  2.  If you choose to wash your hair, wash your hair first as usual with your  normal  shampoo.  3.  After you shampoo, rinse your hair and body thoroughly to remove the  shampoo.                           4.  Use CHG as you would any other liquid soap.  You can apply chg directly  to the skin and wash                       Gently  with a scrungie or clean washcloth.  5.  Apply the CHG Soap to your body ONLY FROM THE NECK DOWN.   Do not use on face/ open                           Wound or open sores. Avoid contact with eyes, ears mouth and genitals (private parts).                       Wash face,  Genitals (private parts) with your normal soap.             6.  Wash thoroughly, paying special attention to the area where your surgery  will be performed.  7.  Thoroughly rinse your body with warm water from the neck down.  8.  DO NOT shower/wash with your normal soap after using and rinsing off  the CHG Soap.                9.  Pat yourself dry with a clean towel.            10.  Wear clean pajamas.            11.  Place clean sheets on your bed the night of your first shower and do not  sleep with pets. Day of Surgery : Do not apply any lotions/deodorants the morning of surgery.  Please wear clean clothes to the hospital/surgery center.  FAILURE TO FOLLOW THESE INSTRUCTIONS MAY RESULT IN THE CANCELLATION OF  YOUR SURGERY PATIENT SIGNATURE_________________________________  NURSE SIGNATURE__________________________________  ________________________________________________________________________   Adam Phenix  An incentive spirometer is a tool that can help keep your lungs clear and active. This tool measures how well you are filling your lungs with each breath. Taking long deep breaths may help reverse or decrease the chance of developing breathing (pulmonary) problems (especially infection) following:  A long period of time when you are unable to move or be active. BEFORE THE PROCEDURE   If the spirometer includes an indicator to show your best effort, your nurse or respiratory therapist will set it to a desired goal.  If possible, sit up straight or lean slightly forward. Try not to slouch.  Hold the incentive spirometer in an upright position. INSTRUCTIONS FOR USE  1. Sit on the edge of your bed if possible, or sit up as far as you can in bed or on a chair. 2. Hold the incentive spirometer in an upright position. 3. Breathe out normally. 4. Place the mouthpiece in your mouth and seal your lips tightly around it. 5. Breathe in slowly and as deeply as possible, raising the piston or the ball toward the top of the column. 6. Hold your breath for 3-5 seconds or for as long as possible. Allow the piston or ball to fall to the bottom of the column. 7. Remove the mouthpiece from your mouth and breathe out normally. 8. Rest for a few seconds and repeat Steps 1 through 7 at least 10 times every 1-2 hours when you are awake. Take your time and take a few normal breaths between deep breaths. 9. The spirometer may include an indicator to show your best effort. Use the indicator as a goal to work toward during each repetition. 10. After each set of 10 deep breaths, practice coughing to be sure your lungs are clear. If you have an incision (the cut made at the time of surgery), support  your  incision when coughing by placing a pillow or rolled up towels firmly against it. Once you are able to get out of bed, walk around indoors and cough well. You may stop using the incentive spirometer when instructed by your caregiver.  RISKS AND COMPLICATIONS  Take your time so you do not get dizzy or light-headed.  If you are in pain, you may need to take or ask for pain medication before doing incentive spirometry. It is harder to take a deep breath if you are having pain. AFTER USE  Rest and breathe slowly and easily.  It can be helpful to keep track of a log of your progress. Your caregiver can provide you with a simple table to help with this. If you are using the spirometer at home, follow these instructions: [place] IF:   You are having difficultly using the spirometer.  You have trouble using the spirometer as often as instructed.  Your pain medication is not giving enough relief while using the spirometer.  You develop fever of 100.5 F (38.1 C) or higher. SEEK IMMEDIATE MEDICAL CARE IF:   You cough up bloody sputum that had not been present before.  You develop fever of 102 F (38.9 C) or greater.  You develop worsening pain at or near the incision site. MAKE SURE YOU:   Understand these instructions.  Will watch your condition.  Will get help right away if you are not doing well or get worse. Document Released: 10/28/2006 Document Revised: 09/09/2011 Document Reviewed: 12/29/2006 Exit[employer] Patient Information 2014 [employer], [place].   ________________________________________________________________________  WHAT IS A BLOOD TRANSFUSION? Blood Transfusion Information  A transfusion is the replacement of blood or some of its parts. Blood is made up of multiple cells which provide different functions.  Red blood cells carry oxygen and are used for blood loss replacement.  White blood cells fight against infection.  Platelets control bleeding.  Plasma  helps clot blood.  Other blood products are available for specialized needs, such as hemophilia or other clotting disorders. BEFORE THE TRANSFUSION  Who gives blood for transfusions?   Healthy volunteers who are fully evaluated to make sure their blood is safe. This is blood bank blood. Transfusion therapy is the safest it has ever been in the practice of medicine. Before blood is taken from a donor, a complete history is taken to make sure that person has no history of diseases nor engages in risky social behavior (examples are intravenous drug use or sexual activity with multiple partners). The donor's travel history is screened to minimize risk of transmitting infections, such as malaria. The donated blood is tested for signs of infectious diseases, such as HIV and hepatitis. The blood is then tested to be sure it is compatible with you in order to minimize the chance of a transfusion reaction. If you or a relative donates blood, this is often done in anticipation of surgery and is not appropriate for emergency situations. It takes many days to process the donated blood. RISKS AND COMPLICATIONS Although transfusion therapy is very safe and saves many lives, the main dangers of transfusion include:   Getting an infectious disease.  Developing a transfusion reaction. This is an allergic reaction to something in the blood you were given. Every precaution is taken to prevent this. The decision to have a blood transfusion has been considered carefully by your caregiver before blood is given. Blood is not given unless the benefits outweigh the risks. AFTER THE TRANSFUSION  Right after receiving a blood transfusion, you will usually feel much better and more energetic. This is especially true if your red blood cells have gotten low (anemic). The transfusion raises the level of the red blood cells which carry oxygen, and this usually causes an energy increase.  The nurse administering the transfusion  will monitor you carefully for complications. HOME CARE INSTRUCTIONS  No special instructions are needed after a transfusion. You may find your energy is better. Speak with your caregiver about any limitations on activity for underlying diseases you may have. SEEK MEDICAL CARE IF:   Your condition is not improving after your transfusion.  You develop redness or irritation at the intravenous (IV) site. SEEK IMMEDIATE MEDICAL CARE IF:  Any of the following symptoms occur over the next 12 hours:  Shaking chills.  You have a temperature by mouth above 102 F (38.9 C), not controlled by medicine.  Chest, back, or muscle pain.  People around you feel you are not acting correctly or are confused.  Shortness of breath or difficulty breathing.  Dizziness and fainting.  You get a rash or develop hives.  You have a decrease in urine output.  Your urine turns a dark color or changes to pink, red, or brown. Any of the following symptoms occur over the next 10 days:  You have a temperature by mouth above 102 F (38.9 C), not controlled by medicine.  Shortness of breath.  Weakness after normal activity.  The white part of the eye turns yellow (jaundice).  You have a decrease in the amount of urine or are urinating less often.  Your urine turns a dark color or changes to pink, red, or brown. Document Released: 06/14/2000 Document Revised: 09/09/2011 Document Reviewed: 02/01/2008 [facility] Patient Information 2014 [place], [place].  _______________________________________________________________________

## 2019-06-22 ENCOUNTER — Other Ambulatory Visit (HOSPITAL_COMMUNITY): Payer: Medicare Other

## 2019-06-22 ENCOUNTER — Encounter (HOSPITAL_COMMUNITY)
Admission: RE | Admit: 2019-06-22 | Discharge: 2019-06-22 | Disposition: A | Discharge status: Home / Self Care | Payer: Medicare Other | Source: Ambulatory Visit | Attending: Orthopedic Surgery | Admitting: Orthopedic Surgery

## 2019-06-22 ENCOUNTER — Inpatient Hospital Stay (HOSPITAL_COMMUNITY): Admission: RE | Admit: 2019-06-22 | Payer: Medicare Other | Source: Ambulatory Visit

## 2019-06-22 ENCOUNTER — Encounter (HOSPITAL_COMMUNITY): Payer: Medicare Other

## 2019-06-22 ENCOUNTER — Other Ambulatory Visit: Payer: Self-pay

## 2019-06-22 ENCOUNTER — Encounter (HOSPITAL_COMMUNITY): Payer: Self-pay

## 2019-06-22 ENCOUNTER — Encounter (HOSPITAL_COMMUNITY): Admission: RE | Admit: 2019-06-22 | Payer: Medicare Other | Source: Ambulatory Visit

## 2019-06-22 DIAGNOSIS — I10 Essential (primary) hypertension: Secondary | ICD-10-CM | POA: Diagnosis not present

## 2019-06-22 DIAGNOSIS — E782 Mixed hyperlipidemia: Secondary | ICD-10-CM | POA: Insufficient documentation

## 2019-06-22 DIAGNOSIS — Z01812 Encounter for preprocedural laboratory examination: Secondary | ICD-10-CM | POA: Insufficient documentation

## 2019-06-22 DIAGNOSIS — Z7984 Long term (current) use of oral hypoglycemic drugs: Secondary | ICD-10-CM | POA: Insufficient documentation

## 2019-06-22 DIAGNOSIS — Z951 Presence of aortocoronary bypass graft: Secondary | ICD-10-CM | POA: Insufficient documentation

## 2019-06-22 DIAGNOSIS — E119 Type 2 diabetes mellitus without complications: Secondary | ICD-10-CM | POA: Diagnosis not present

## 2019-06-22 DIAGNOSIS — I2510 Atherosclerotic heart disease of native coronary artery without angina pectoris: Secondary | ICD-10-CM | POA: Insufficient documentation

## 2019-06-22 DIAGNOSIS — Z8572 Personal history of non-Hodgkin lymphomas: Secondary | ICD-10-CM | POA: Diagnosis not present

## 2019-06-22 DIAGNOSIS — M1712 Unilateral primary osteoarthritis, left knee: Secondary | ICD-10-CM | POA: Insufficient documentation

## 2019-06-22 DIAGNOSIS — Z7901 Long term (current) use of anticoagulants: Secondary | ICD-10-CM | POA: Diagnosis not present

## 2019-06-22 DIAGNOSIS — Z79899 Other long term (current) drug therapy: Secondary | ICD-10-CM | POA: Insufficient documentation

## 2019-06-22 DIAGNOSIS — G4733 Obstructive sleep apnea (adult) (pediatric): Secondary | ICD-10-CM | POA: Insufficient documentation

## 2019-06-22 DIAGNOSIS — K219 Gastro-esophageal reflux disease without esophagitis: Secondary | ICD-10-CM | POA: Insufficient documentation

## 2019-06-22 DIAGNOSIS — Z950 Presence of cardiac pacemaker: Secondary | ICD-10-CM | POA: Insufficient documentation

## 2019-06-22 HISTORY — DX: Presence of cardiac pacemaker: Z95.0

## 2019-06-22 LAB — CBC
HCT: 39.4 % (ref 39.0–52.0)
Hemoglobin: 12.3 g/dL — ABNORMAL LOW (ref 13.0–17.0)
MCH: 28.6 pg (ref 26.0–34.0)
MCHC: 31.2 g/dL (ref 30.0–36.0)
MCV: 91.6 fL (ref 80.0–100.0)
Platelets: 200 10*3/uL (ref 150–400)
RBC: 4.3 MIL/uL (ref 4.22–5.81)
RDW: 16.8 % — ABNORMAL HIGH (ref 11.5–15.5)
WBC: 7.6 10*3/uL (ref 4.0–10.5)
nRBC: 0 % (ref 0.0–0.2)

## 2019-06-22 LAB — BASIC METABOLIC PANEL
Anion gap: 11 (ref 5–15)
BUN: 27 mg/dL — ABNORMAL HIGH (ref 8–23)
CO2: 25 mmol/L (ref 22–32)
Calcium: 9.8 mg/dL (ref 8.9–10.3)
Chloride: 101 mmol/L (ref 98–111)
Creatinine, Ser: 1.72 mg/dL — ABNORMAL HIGH (ref 0.61–1.24)
GFR calc Af Amer: 43 mL/min — ABNORMAL LOW (ref >60)
GFR calc non Af Amer: 37 mL/min — ABNORMAL LOW (ref >60)
Glucose, Bld: 209 mg/dL — ABNORMAL HIGH (ref 70–99)
Potassium: 3.9 mmol/L (ref 3.5–5.1)
Sodium: 137 mmol/L (ref 135–145)

## 2019-06-22 LAB — HEMOGLOBIN A1C
Hgb A1c MFr Bld: 6.5 % — ABNORMAL HIGH (ref 4.8–5.6)
Mean Plasma Glucose: 139.85 mg/dL

## 2019-06-22 LAB — SURGICAL PCR SCREEN
MRSA, PCR: NEGATIVE
Staphylococcus aureus: NEGATIVE

## 2019-06-22 LAB — GLUCOSE, CAPILLARY: Glucose-Capillary: 202 mg/dL — ABNORMAL HIGH (ref 70–99)

## 2019-06-22 LAB — ABO/RH: ABO/RH(D): A POS

## 2019-06-22 NOTE — Progress Notes (Deleted)
Routed results for PCR screen to Dr. Alvan Dame.Marland KitchenMarland KitchenMarland KitchenPlease see results patient is positive for Staph

## 2019-06-22 NOTE — Progress Notes (Addendum)
PCP - Dr. Roma Schanz Cardiologist - Dr. Minus Breeding  LOV 04/02/2019 in Epic-Cardiac Clearance addressed  EP Physician- Dr. [name]   Pacemaker orders on chart signed from Dr. Caryl Comes 06/21/2019 04/29/2019-Device check Epic  Pulmonary- Dr. Elsworth Soho for OSA  LOV-04/22/2019 Epic for Pulmonary Clearance  Oncology- Dr. Marin Olp 06/18/2019 Epic along with labs-CBC w/diff., CMP, Ferritin, Iron and TIBC, reticulocytes, erythropoietin   Chest x-ray - n/a 02/24/2018- NM PET image restaging EKG - 04/02/2019 EPIC Stress Test - 04/11/2008   EPIC ECHO - 11/05/2018 EPIC Cardiac Cath - 07/28/2017-Right /Left Heart cath  EPIC;  03/16/2014-Left heart cath EPIC  Sleep Study - 2017 CPAP - yes   Setting-15 cm with oxygen 2 liters  Fasting Blood Sugar - 135 Checks Blood Sugar __1___ times a day  Blood Thinner Instructions:Eliquis per Dr. Alvan Dame, patient instructed to Stop Eliquis 3 days before surgery. Aspirin Instructions:n/a Last Dose:of Eliquis will be Friday 06/25/2019  Anesthesia review:  Chart given to Konrad Felix, PA for review of medical history and labs as CBG on 06/22/2019 was 202.  Patient has a history of CAD, HTN, DM Type 2, OSA on CPAP, NSTEMI MI, Diffuse large B cell lymphoma (sees Dr. Marin Olp- last chemo was 22 months ago),Ascending Aortic Aneurysm, Pacemaker, CABG-03/2000, coronary angioplasty-1993, coronary angioplasty with stent -05/1997, TAVR( Transcatheter Aortic Valve Replacement) -09/09/2017  Patient denies shortness of breath, fever, cough and chest pain at PAT appointment   Patient verbalized understanding of instructions that were given to them at the PAT appointment. Patient was also instructed that they will need to review over the PAT instructions again at home before surgery.

## 2019-06-23 NOTE — Progress Notes (Signed)
Anesthesia Chart Review   Case: [id number] Date/Time: 06/29/19 0950   Procedure: TOTAL KNEE ARTHROPLASTY (Left Knee) - 70 mins   Anesthesia type: Spinal   Pre-op diagnosis: Left knee osteoarthritis   [facility]OM 09 / [facility]   Surgeons: Paralee Cancel, MD      DISCUSSION:78 y.o. never smoker with h/o HTN, DM II, GERD, OSA on CPAP, 2-1 AV block s/p PPM (pacemaker orders on chart), s/p TAVR 09/09/2017, CAD (CABG 2001), diffuse large cell non-Hodgkin's lymphoma, pernicious anemia, left hip OA scheduled for above procedure 06/29/2019 with Dr. Paralee Cancel.    H/o atrial flutter, on Eliquis, last dose 06/25/2019.   Last seen by hematology/oncology 06/18/2019.  Per Dr. Burney Gauze, "Mr. Borg is here today for follow-up.  He looks fantastic.  He is ready for his left knee surgery.  This is can be done on December 29."  OSA followed by pulmonologist.  Last seen by Dr. Kara Mead 04/22/2019.  Per OV note, "You are cleared for knee surgery-take your machine with you to sleep in the hospital"  Pt last seen by cardiologist, Dr. Minus Breeding, 04/02/2019.  Per OV note, "Preop: He is thinking of having orthopedic surgery.  He would be at acceptable risk for the planned procedure.  No change in therapy is indicated."  Anticipate pt can proceed with planned procedure barring acute status change.   VS: BP 136/80 (BP Location: Left Arm)   Pulse 66   Temp 36.5 C (Oral)   Resp 18   Ht 6' (1.829 m)   Wt 115.8 kg   SpO2 97%   BMI 34.64 kg/m   PROVIDERS: [name], DO is PCP   Minus Breeding, MD is Cardiologist   [name], MD is electrophysiologist   Elsworth Soho, MD is Pulmonologist   Burney Gauze, MD is Hematologist/Oncologist  LABS: Labs reviewed: Acceptable for surgery. (all labs ordered are listed, but only abnormal results are displayed)  Labs Reviewed  GLUCOSE, CAPILLARY - Abnormal; Notable for the following components:      Result Value   Glucose-Capillary 202 (*)     All other components within normal limits  TYPE AND SCREEN  ABO/RH     IMAGES:   EKG: 04/02/2019 Rate 69 bpm Ventricular-paced rhythm   CV: Echo 11/05/2018 IMPRESSIONS    1. The left ventricle has low normal systolic function, with an ejection fraction of 50-55%. The cavity size was mildly dilated. There is moderately increased left ventricular wall thickness. Left ventricular diastolic Doppler parameters are  indeterminate.  2. Distal septal hypokinesis Inferior basal hypokinesis.  3. The right ventricle has normal systolic function. The cavity was normal. There is no increase in right ventricular wall thickness.  4. Pacing wires in RA/RV the RA wire appears unusually mobile suggest checking thressholds.  5. Left atrial size was moderately dilated.  6. Right atrial size was mildly dilated.  7. The mitral valve is degenerative. Moderate thickening of the mitral valve leaflet. Moderate calcification of the mitral valve leaflet. There is moderate mitral annular calcification present.  8. Tricuspid valve regurgitation is moderate.  9. - TAVR: Good position of 26 mm Sapien 3 valve with no PVL gradients stable since 10/08/17.  Cardiac Cath 07/28/2017 1.  Severe native three-vessel coronary artery disease with moderate distal left main stenosis, total occlusion of the LAD, total occlusion of the ramus intermedius, total occlusion of the first OM, and total occlusion of the RCA. 2.  Status post aortocoronary bypass surgery with continued  patency of the RIMA to PDA, LIMA to LAD, and sequential saphenous vein graft to ramus intermedius and first OM branches of the circumflex. 3.  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  Severe calcific aortic stenosis with a mean transvalvular gradient of 51 mmHg Past Medical History:  Diagnosis Date  . Anemia   . Arthritis    hips  . Axillary adenopathy 02/25/2017  .  Bradycardia    a. holter monitor has demonstrated HRs in 30s and [place]   . CAD (coronary artery disease)    a. s/p CABG 2001  b.  07/28/2017 cath:   Severe three-vessel native CAD with total occlusion of LAD, ramus intermedius, first OM and RCA, patent RIMA to PDA, LIMA to LAD, sequential SVG to ramus intermedius and first OM.    [name] Chronic lower back pain   . Diffuse large B cell lymphoma ([place])   . Diverticulosis   . Esophageal stricture   . GERD (gastroesophageal reflux disease)   . History of gout   . HTN (hypertension)   . Mixed hyperlipidemia   . OSA on CPAP    with 2L O2 at night  . Pancytopenia ([place])    a. related to chemo therapy for B cell lymphoma  . Peptic stricture of esophagus   . Presence of permanent cardiac pacemaker    sees Dr. Valaria Good pacemaker  . Severe aortic stenosis   . Spinal stenosis   . Type II diabetes mellitus ([place])   . Wears dentures    partial upper    Past Surgical History:  Procedure Laterality Date  . APPENDECTOMY  ~ 1952  . BACK SURGERY    . CATARACT EXTRACTION W/PHACO Left 11/06/2016   Procedure: CATARACT EXTRACTION PHACO AND INTRAOCULAR LENS PLACEMENT (IOC);  Surgeon: Estill Cotta, MD;  Location: [facility];  Service: Ophthalmology;  Laterality: Left;  Lot # [id number] H [place]: 01:09.4 AP%:25.2 CDE: 30.64  . CATARACT EXTRACTION W/PHACO Right 12/04/2016   Procedure: CATARACT EXTRACTION PHACO AND INTRAOCULAR LENS PLACEMENT (IOC);  Surgeon: Estill Cotta, MD;  Location: [facility];  Service: Ophthalmology;  Laterality: Right;  [place] 1:25.9 AP% 24.1 CDE 39.10 Fluid Pack lot # [id number]  . COLONOSCOPY W/ BIOPSIES AND POLYPECTOMY  2013  . CORONARY ANGIOPLASTY  1993  . CORONARY ANGIOPLASTY WITH STENT PLACEMENT  05/1997   "1"  . CORONARY ARTERY BYPASS GRAFT  03/2000   "CABG X5"  . ECTROPION REPAIR Right 09/01/2018   Procedure: REPAIR OF ECTROPION BILATERAL upper and lower;  Surgeon: Karle Starch, MD;  Location: [place];  Service:  Ophthalmology;  Laterality: Right;  Diabetic - oral meds sleep apnea  . ESOPHAGEAL DILATION  X 3-4   Dr. Lyla Son; "last one was in the 1990's"  . ESOPHAGOGASTRODUODENOSCOPY     multiple  . FLEXIBLE SIGMOIDOSCOPY     multiple  . HYDRADENITIS EXCISION Left 02/25/2017   Procedure: EXCISION DEEP LEFT AXILLARY LYMPH NODE;  Surgeon: Fanny Skates, MD;  Location: [place];  Service: General;  Laterality: Left;  . INTRAOPERATIVE TRANSTHORACIC ECHOCARDIOGRAM N/A 09/09/2017   Procedure: INTRAOPERATIVE TRANSTHORACIC ECHOCARDIOGRAM;  Surgeon: Burnell Blanks, MD;  Location: [place];  Service: Open Heart Surgery;  Laterality: N/A;  . KNEE ARTHROSCOPY Left 2011   meniscus repair  . LEFT HEART CATHETERIZATION WITH CORONARY/GRAFT ANGIOGRAM N/A 03/16/2014   Procedure: LEFT HEART CATHETERIZATION WITH Beatrix Fetters;  Surgeon: Burnell Blanks, MD;  Location: [facility] CATH  LAB;  Service: Cardiovascular;  Laterality: N/A;  . LUMBAR LAMINECTOMY/DECOMPRESSION MICRODISCECTOMY Right 06/17/2013   Procedure: LUMBAR LAMINECTOMY MICRODISCECTOMY L4-L5 RIGHT EXCISION OF SYNOVIAL CYST RIGHT   (1 LEVEL) RIGHT PARTIAL FACETECTOMY;  Surgeon: Tobi Bastos, MD;  [facility];  Service: Orthopedics;  Laterality: Right;  . MYELOGRAM  04/06/13   lumbar, Dr Gladstone Lighter  . ORBITAL LESION EXCISION Right 09/01/2018   Procedure: ORBITOTOMY WITHOUT BONE FLAP WITH REMOVAL OF LESION RIGHT;  Surgeon: Karle Starch, MD;  Location: [place];  Service: Ophthalmology;  Laterality: Right;  . PACEMAKER IMPLANT N/A 09/10/2017   Procedure: PACEMAKER IMPLANT;  Surgeon: Deboraha Sprang, MD;  Location: [place] CV LAB;  Service: Cardiovascular;  Laterality: N/A;  . PANENDOSCOPY    . PORTACATH PLACEMENT N/A 03/06/2017   Procedure: INSERTION PORT-A-CATH AND ASPIRATE SEROMA LEFT AXILLA;  Surgeon: Fanny Skates, MD;  Location: [place];  Service: General;  Laterality: N/A;  . RIGHT/LEFT HEART CATH AND CORONARY/GRAFT  ANGIOGRAPHY N/A 07/28/2017   Procedure: RIGHT/LEFT HEART CATH AND CORONARY/GRAFT ANGIOGRAPHY;  Surgeon: Sherren Mocha, MD;  Location: [place] [facility];  Service: Cardiovascular;  Laterality: N/A;  . SHOULDER SURGERY Right 08/2010   screws placed; "tendons tore off"  . SKIN CANCER EXCISION Right    "neck"  . TONSILLECTOMY  ~ 1954  . TRANSCATHETER AORTIC VALVE REPLACEMENT, TRANSFEMORAL N/A 09/09/2017   Procedure: TRANSCATHETER AORTIC VALVE REPLACEMENT, TRANSFEMORAL;  Surgeon: Burnell Blanks, MD;  Location: [place];  Service: Open Heart Surgery;  Laterality: N/A;  . UPPER GI ENDOSCOPY  2013   Gastritis; Dr Carlean Purl  . VASECTOMY      MEDICATIONS: . acetaminophen (TYLENOL) 500 MG tablet  . allopurinol (ZYLOPRIM) 300 MG tablet  . aluminum hydroxide-magnesium carbonate (GAVISCON) 95-358 MG/15ML SUSP  . amLODipine (NORVASC) 5 MG tablet  . apixaban (ELIQUIS) 2.5 MG TABS tablet  . atorvastatin (LIPITOR) 80 MG tablet  . azelastine (ASTELIN) 0.1 % nasal spray  . esomeprazole (NEXIUM) 40 MG capsule  . ezetimibe (ZETIA) 10 MG tablet  . fenofibrate 160 MG tablet  . folic acid (FOLVITE) 1 MG tablet  . furosemide (LASIX) 40 MG tablet  . glimepiride (AMARYL) 2 MG tablet  . glucose blood (FREESTYLE LITE) test strip  . KLOR-CON M20 20 MEQ tablet  . Lancets (FREESTYLE) lancets  . metFORMIN (GLUCOPHAGE) 1000 MG tablet  . metoprolol tartrate (LOPRESSOR) 25 MG tablet  . nitroGLYCERIN (NITROSTAT) 0.4 MG SL tablet  . polycarbophil (FIBERCON) 625 MG tablet  . pregabalin (LYRICA) 150 MG capsule  . pregabalin (LYRICA) 75 MG capsule  . psyllium (METAMUCIL) 58.6 % powder  . VASCEPA 1 g CAPS   No current facility-administered medications for this encounter.   . sodium chloride flush (NS) 0.9 % injection 10 mL     Maia Plan [facility] Pre-Surgical Testing [phone] 06/23/19  1:38 PM

## 2019-06-23 NOTE — Anesthesia Preprocedure Evaluation (Addendum)
Anesthesia Evaluation  Patient identified by MRN, date of birth, ID band Patient awake    Reviewed: Allergy & Precautions, NPO status , Patient's Chart, lab work & pertinent test results  Airway Mallampati: III  TM Distance: >3 FB Neck ROM: Full    Dental  (+) Teeth Intact, Dental Advisory Given   Pulmonary sleep apnea and Continuous Positive Airway Pressure Ventilation ,    breath sounds clear to auscultation       Cardiovascular hypertension, Pt. on medications + CAD, + Cardiac Stents, + CABG, + Peripheral Vascular Disease and +CHF  Normal cardiovascular exam+ dysrhythmias + pacemaker + Valvular Problems/Murmurs AS   - s/p TAVR   Neuro/Psych  Neuromuscular disease negative psych ROS   GI/Hepatic Neg liver ROS, GERD  Medicated,  Endo/Other  diabetes, Type 2, Oral Hypoglycemic Agents  Renal/GU      Musculoskeletal  (+) Arthritis ,   Abdominal (+) + obese,   Peds  Hematology negative hematology ROS (+)   Anesthesia Other Findings   Reproductive/Obstetrics                           Lab Results  Component Value Date   WBC 7.6 06/22/2019   HGB 12.3 (L) 06/22/2019   HCT 39.4 06/22/2019   MCV 91.6 06/22/2019   PLT 200 06/22/2019   Lab Results  Component Value Date   INR 1.15 11/15/2017   INR 1.45 09/09/2017   INR 1.24 09/05/2017    Echo:   1. The left ventricle has low normal systolic function, with an ejection fraction of 50-55%. The cavity size was mildly dilated. There is moderately increased left ventricular wall thickness. Left ventricular diastolic Doppler parameters are  indeterminate.  2. Distal septal hypokinesis Inferior basal hypokinesis.  3. The right ventricle has normal systolic function. The cavity was normal. There is no increase in right ventricular wall thickness.  4. Pacing wires in RA/RV the RA wire appears unusually mobile suggest checking thressholds.  5. Left  atrial size was moderately dilated.  6. Right atrial size was mildly dilated.  7. The mitral valve is degenerative. Moderate thickening of the mitral valve leaflet. Moderate calcification of the mitral valve leaflet. There is moderate mitral annular calcification present.  8. Tricuspid valve regurgitation is moderate.  9. - TAVR: Good position of 26 mm Sapien 3 valve with no PVL gradients stable since 10/08/17.  Anesthesia Physical Anesthesia Plan  ASA: III  Anesthesia Plan: Spinal   Post-op Pain Management:    Induction: Intravenous  PONV Risk Score and Plan: 2 and Ondansetron and Propofol infusion  Airway Management Planned: Natural Airway and Simple Face Mask  Additional Equipment: None  Intra-op Plan:   Post-operative Plan:   Informed Consent: I have reviewed the patients History and Physical, chart, labs and discussed the procedure including the risks, benefits and alternatives for the proposed anesthesia with the patient or authorized representative who has indicated his/her understanding and acceptance.       Plan Discussed with: CRNA  Anesthesia Plan Comments: (See PAT note 06/22/2019, Konrad Felix, PA-C)     Anesthesia Quick Evaluation

## 2019-06-26 ENCOUNTER — Other Ambulatory Visit (HOSPITAL_COMMUNITY)
Admission: RE | Admit: 2019-06-26 | Discharge: 2019-06-26 | Disposition: A | Discharge status: Home / Self Care | Payer: Medicare Other | Source: Ambulatory Visit | Attending: Orthopedic Surgery | Admitting: Orthopedic Surgery

## 2019-06-26 DIAGNOSIS — Z01812 Encounter for preprocedural laboratory examination: Secondary | ICD-10-CM | POA: Insufficient documentation

## 2019-06-26 DIAGNOSIS — Z20828 Contact with and (suspected) exposure to other viral communicable diseases: Secondary | ICD-10-CM | POA: Diagnosis not present

## 2019-06-26 LAB — SARS CORONAVIRUS 2 (TAT 6-24 HRS): SARS Coronavirus 2: NEGATIVE

## 2019-06-28 ENCOUNTER — Other Ambulatory Visit: Payer: Self-pay | Admitting: Family Medicine

## 2019-06-28 DIAGNOSIS — E1151 Type 2 diabetes mellitus with diabetic peripheral angiopathy without gangrene: Secondary | ICD-10-CM

## 2019-06-28 DIAGNOSIS — IMO0002 Reserved for concepts with insufficient information to code with codable children: Secondary | ICD-10-CM

## 2019-06-29 ENCOUNTER — Other Ambulatory Visit: Payer: Self-pay

## 2019-06-29 ENCOUNTER — Ambulatory Visit (HOSPITAL_COMMUNITY)
Admission: RE | Admit: 2019-06-29 | Discharge: 2019-06-29 | Disposition: A | Discharge status: Home / Self Care | Payer: Medicare Other | Attending: Orthopedic Surgery | Admitting: Orthopedic Surgery

## 2019-06-29 ENCOUNTER — Ambulatory Visit (HOSPITAL_COMMUNITY): Payer: Medicare Other | Admitting: Physician Assistant

## 2019-06-29 ENCOUNTER — Ambulatory Visit (HOSPITAL_COMMUNITY): Payer: Medicare Other | Admitting: Certified Registered Nurse Anesthetist

## 2019-06-29 ENCOUNTER — Encounter (HOSPITAL_COMMUNITY)
Admission: RE | Disposition: A | Discharge status: Home / Self Care | Payer: Self-pay | Source: Home / Self Care | Attending: Orthopedic Surgery

## 2019-06-29 ENCOUNTER — Encounter (HOSPITAL_COMMUNITY): Payer: Self-pay | Admitting: Orthopedic Surgery

## 2019-06-29 DIAGNOSIS — Z952 Presence of prosthetic heart valve: Secondary | ICD-10-CM | POA: Diagnosis not present

## 2019-06-29 DIAGNOSIS — Z6834 Body mass index (BMI) 34.0-34.9, adult: Secondary | ICD-10-CM | POA: Insufficient documentation

## 2019-06-29 DIAGNOSIS — I110 Hypertensive heart disease with heart failure: Secondary | ICD-10-CM | POA: Insufficient documentation

## 2019-06-29 DIAGNOSIS — I5033 Acute on chronic diastolic (congestive) heart failure: Secondary | ICD-10-CM | POA: Diagnosis not present

## 2019-06-29 DIAGNOSIS — M48061 Spinal stenosis, lumbar region without neurogenic claudication: Secondary | ICD-10-CM | POA: Insufficient documentation

## 2019-06-29 DIAGNOSIS — Z955 Presence of coronary angioplasty implant and graft: Secondary | ICD-10-CM | POA: Insufficient documentation

## 2019-06-29 DIAGNOSIS — I480 Paroxysmal atrial fibrillation: Secondary | ICD-10-CM | POA: Insufficient documentation

## 2019-06-29 DIAGNOSIS — Z951 Presence of aortocoronary bypass graft: Secondary | ICD-10-CM | POA: Diagnosis not present

## 2019-06-29 DIAGNOSIS — I2510 Atherosclerotic heart disease of native coronary artery without angina pectoris: Secondary | ICD-10-CM | POA: Diagnosis not present

## 2019-06-29 DIAGNOSIS — Z833 Family history of diabetes mellitus: Secondary | ICD-10-CM | POA: Insufficient documentation

## 2019-06-29 DIAGNOSIS — M1712 Unilateral primary osteoarthritis, left knee: Secondary | ICD-10-CM | POA: Diagnosis not present

## 2019-06-29 DIAGNOSIS — E739 Lactose intolerance, unspecified: Secondary | ICD-10-CM | POA: Insufficient documentation

## 2019-06-29 DIAGNOSIS — D61818 Other pancytopenia: Secondary | ICD-10-CM | POA: Insufficient documentation

## 2019-06-29 DIAGNOSIS — I252 Old myocardial infarction: Secondary | ICD-10-CM | POA: Insufficient documentation

## 2019-06-29 DIAGNOSIS — E1142 Type 2 diabetes mellitus with diabetic polyneuropathy: Secondary | ICD-10-CM | POA: Diagnosis not present

## 2019-06-29 DIAGNOSIS — Z888 Allergy status to other drugs, medicaments and biological substances status: Secondary | ICD-10-CM | POA: Insufficient documentation

## 2019-06-29 DIAGNOSIS — G4733 Obstructive sleep apnea (adult) (pediatric): Secondary | ICD-10-CM | POA: Diagnosis not present

## 2019-06-29 DIAGNOSIS — Z7901 Long term (current) use of anticoagulants: Secondary | ICD-10-CM | POA: Insufficient documentation

## 2019-06-29 DIAGNOSIS — E782 Mixed hyperlipidemia: Secondary | ICD-10-CM | POA: Insufficient documentation

## 2019-06-29 DIAGNOSIS — I483 Typical atrial flutter: Secondary | ICD-10-CM | POA: Diagnosis not present

## 2019-06-29 DIAGNOSIS — I248 Other forms of acute ischemic heart disease: Secondary | ICD-10-CM | POA: Insufficient documentation

## 2019-06-29 DIAGNOSIS — Z79899 Other long term (current) drug therapy: Secondary | ICD-10-CM | POA: Insufficient documentation

## 2019-06-29 DIAGNOSIS — Z823 Family history of stroke: Secondary | ICD-10-CM | POA: Insufficient documentation

## 2019-06-29 DIAGNOSIS — C8332 Diffuse large B-cell lymphoma, intrathoracic lymph nodes: Secondary | ICD-10-CM | POA: Diagnosis not present

## 2019-06-29 DIAGNOSIS — E538 Deficiency of other specified B group vitamins: Secondary | ICD-10-CM | POA: Insufficient documentation

## 2019-06-29 DIAGNOSIS — I350 Nonrheumatic aortic (valve) stenosis: Secondary | ICD-10-CM | POA: Diagnosis not present

## 2019-06-29 DIAGNOSIS — Z9981 Dependence on supplemental oxygen: Secondary | ICD-10-CM | POA: Insufficient documentation

## 2019-06-29 DIAGNOSIS — Z7984 Long term (current) use of oral hypoglycemic drugs: Secondary | ICD-10-CM | POA: Insufficient documentation

## 2019-06-29 DIAGNOSIS — E1151 Type 2 diabetes mellitus with diabetic peripheral angiopathy without gangrene: Secondary | ICD-10-CM | POA: Insufficient documentation

## 2019-06-29 DIAGNOSIS — Z9841 Cataract extraction status, right eye: Secondary | ICD-10-CM | POA: Insufficient documentation

## 2019-06-29 DIAGNOSIS — K219 Gastro-esophageal reflux disease without esophagitis: Secondary | ICD-10-CM | POA: Diagnosis not present

## 2019-06-29 DIAGNOSIS — E669 Obesity, unspecified: Secondary | ICD-10-CM | POA: Insufficient documentation

## 2019-06-29 DIAGNOSIS — Z96652 Presence of left artificial knee joint: Secondary | ICD-10-CM

## 2019-06-29 DIAGNOSIS — I442 Atrioventricular block, complete: Secondary | ICD-10-CM | POA: Diagnosis not present

## 2019-06-29 DIAGNOSIS — Z800 Family history of malignant neoplasm of digestive organs: Secondary | ICD-10-CM | POA: Insufficient documentation

## 2019-06-29 DIAGNOSIS — M1990 Unspecified osteoarthritis, unspecified site: Secondary | ICD-10-CM | POA: Insufficient documentation

## 2019-06-29 DIAGNOSIS — Z8249 Family history of ischemic heart disease and other diseases of the circulatory system: Secondary | ICD-10-CM | POA: Insufficient documentation

## 2019-06-29 DIAGNOSIS — Z886 Allergy status to analgesic agent status: Secondary | ICD-10-CM | POA: Insufficient documentation

## 2019-06-29 DIAGNOSIS — Z9842 Cataract extraction status, left eye: Secondary | ICD-10-CM | POA: Insufficient documentation

## 2019-06-29 HISTORY — PX: TOTAL KNEE ARTHROPLASTY: SHX125

## 2019-06-29 LAB — GLUCOSE, CAPILLARY
Glucose-Capillary: 134 mg/dL — ABNORMAL HIGH (ref 70–99)
Glucose-Capillary: 162 mg/dL — ABNORMAL HIGH (ref 70–99)

## 2019-06-29 LAB — TYPE AND SCREEN
ABO/RH(D): A POS
Antibody Screen: NEGATIVE

## 2019-06-29 SURGERY — ARTHROPLASTY, KNEE, TOTAL
Anesthesia: Spinal | Site: Knee | Laterality: Left

## 2019-06-29 MED ORDER — PROMETHAZINE HCL 25 MG/ML IJ SOLN: 6.2500 mg | INTRAMUSCULAR | Status: DC | PRN

## 2019-06-29 MED ORDER — TRANEXAMIC ACID-NACL 1000-0.7 MG/100ML-% IV SOLN
1000.0000 mg | INTRAVENOUS | Status: AC
  Administered 2019-06-29: 1000 mg via INTRAVENOUS
  Filled 2019-06-29: qty 100

## 2019-06-29 MED ORDER — SODIUM CHLORIDE (PF) 0.9 % IJ SOLN
INTRAMUSCULAR | Status: DC | PRN
  Administered 2019-06-29: 30 mL

## 2019-06-29 MED ORDER — EPHEDRINE SULFATE-NACL 50-0.9 MG/10ML-% IV SOSY
PREFILLED_SYRINGE | INTRAVENOUS | Status: DC | PRN
  Administered 2019-06-29 (×2): 5 mg via INTRAVENOUS
  Administered 2019-06-29: 10 mg via INTRAVENOUS
  Administered 2019-06-29: 5 mg via INTRAVENOUS
  Administered 2019-06-29: 10 mg via INTRAVENOUS
  Administered 2019-06-29: 5 mg via INTRAVENOUS
  Administered 2019-06-29: 10 mg via INTRAVENOUS

## 2019-06-29 MED ORDER — PROPOFOL 500 MG/50ML IV EMUL
INTRAVENOUS | Status: AC
  Filled 2019-06-29: qty 100

## 2019-06-29 MED ORDER — CEFAZOLIN SODIUM-DEXTROSE 2-4 GM/100ML-% IV SOLN
2.0000 g | INTRAVENOUS | Status: AC
  Administered 2019-06-29: 2 g via INTRAVENOUS
  Filled 2019-06-29: qty 100

## 2019-06-29 MED ORDER — ROPIVACAINE HCL 7.5 MG/ML IJ SOLN
INTRAMUSCULAR | Status: DC | PRN
  Administered 2019-06-29: 20 mL via PERINEURAL

## 2019-06-29 MED ORDER — CEFAZOLIN SODIUM-DEXTROSE 2-4 GM/100ML-% IV SOLN
2.0000 g | Freq: Four times a day (QID) | INTRAVENOUS | Status: DC
  Administered 2019-06-29: 15:00:00 2 g via INTRAVENOUS

## 2019-06-29 MED ORDER — ONDANSETRON HCL 4 MG/2ML IJ SOLN
INTRAMUSCULAR | Status: DC | PRN
  Administered 2019-06-29: 4 mg via INTRAVENOUS

## 2019-06-29 MED ORDER — DOCUSATE SODIUM 100 MG PO CAPS: 100.0000 mg | ORAL_CAPSULE | Freq: Two times a day (BID) | ORAL | 0 refills | Status: DC

## 2019-06-29 MED ORDER — LACTATED RINGERS IV SOLN: INTRAVENOUS | Status: DC

## 2019-06-29 MED ORDER — SODIUM CHLORIDE 0.9 % IR SOLN
Status: DC | PRN
  Administered 2019-06-29: 1000 mL

## 2019-06-29 MED ORDER — BUPIVACAINE HCL (PF) 0.25 % IJ SOLN
INTRAMUSCULAR | Status: DC | PRN
  Administered 2019-06-29: 30 mL

## 2019-06-29 MED ORDER — CEFAZOLIN SODIUM-DEXTROSE 2-4 GM/100ML-% IV SOLN
INTRAVENOUS | Status: AC
  Filled 2019-06-29: qty 100

## 2019-06-29 MED ORDER — LACTATED RINGERS IV BOLUS
250.0000 mL | Freq: Once | INTRAVENOUS | Status: AC
  Administered 2019-06-29: 14:00:00 250 mL via INTRAVENOUS

## 2019-06-29 MED ORDER — FENTANYL CITRATE (PF) 100 MCG/2ML IJ SOLN
25.0000 ug | INTRAMUSCULAR | Status: DC | PRN
  Administered 2019-06-29: 50 ug via INTRAVENOUS

## 2019-06-29 MED ORDER — EPHEDRINE 5 MG/ML INJ
INTRAVENOUS | Status: AC
  Filled 2019-06-29: qty 10

## 2019-06-29 MED ORDER — FENTANYL CITRATE (PF) 100 MCG/2ML IJ SOLN
50.0000 ug | INTRAMUSCULAR | Status: AC
  Administered 2019-06-29: 09:00:00 25 ug via INTRAVENOUS
  Filled 2019-06-29: qty 2

## 2019-06-29 MED ORDER — PROPOFOL 500 MG/50ML IV EMUL
INTRAVENOUS | Status: DC | PRN
  Administered 2019-06-29: 25 ug/kg/min via INTRAVENOUS

## 2019-06-29 MED ORDER — DEXAMETHASONE SODIUM PHOSPHATE 10 MG/ML IJ SOLN
INTRAMUSCULAR | Status: AC
  Filled 2019-06-29: qty 1

## 2019-06-29 MED ORDER — METHOCARBAMOL 500 MG PO TABS: 500.0000 mg | ORAL_TABLET | Freq: Four times a day (QID) | ORAL | 0 refills | Status: DC | PRN

## 2019-06-29 MED ORDER — CHLORHEXIDINE GLUCONATE 4 % EX LIQD: 60.0000 mL | Freq: Once | CUTANEOUS | Status: DC

## 2019-06-29 MED ORDER — PHENYLEPHRINE 40 MCG/ML (10ML) SYRINGE FOR IV PUSH (FOR BLOOD PRESSURE SUPPORT)
PREFILLED_SYRINGE | INTRAVENOUS | Status: DC | PRN
  Administered 2019-06-29: 120 ug via INTRAVENOUS
  Administered 2019-06-29: 40 ug via INTRAVENOUS
  Administered 2019-06-29: 80 ug via INTRAVENOUS
  Administered 2019-06-29: 40 ug via INTRAVENOUS
  Administered 2019-06-29 (×2): 80 ug via INTRAVENOUS
  Administered 2019-06-29: 40 ug via INTRAVENOUS
  Administered 2019-06-29 (×2): 80 ug via INTRAVENOUS
  Administered 2019-06-29: 40 ug via INTRAVENOUS
  Administered 2019-06-29: 80 ug via INTRAVENOUS
  Administered 2019-06-29: 120 ug via INTRAVENOUS
  Administered 2019-06-29: 80 ug via INTRAVENOUS

## 2019-06-29 MED ORDER — MEPIVACAINE HCL (PF) 2 % IJ SOLN
INTRAMUSCULAR | Status: AC
  Filled 2019-06-29: qty 20

## 2019-06-29 MED ORDER — HYDROCODONE-ACETAMINOPHEN 7.5-325 MG PO TABS: 1.0000 | ORAL_TABLET | ORAL | 0 refills | Status: DC | PRN

## 2019-06-29 MED ORDER — HYDROCODONE-ACETAMINOPHEN 5-325 MG PO TABS: 1.0000 | ORAL_TABLET | ORAL | Status: DC | PRN

## 2019-06-29 MED ORDER — HYDROCODONE-ACETAMINOPHEN 7.5-325 MG PO TABS: 1.0000 | ORAL_TABLET | ORAL | Status: DC | PRN

## 2019-06-29 MED ORDER — HYDROCODONE-ACETAMINOPHEN 5-325 MG PO TABS
ORAL_TABLET | ORAL | Status: AC
  Administered 2019-06-29: 1 via ORAL
  Filled 2019-06-29: qty 1

## 2019-06-29 MED ORDER — TRANEXAMIC ACID-NACL 1000-0.7 MG/100ML-% IV SOLN
INTRAVENOUS | Status: AC
  Filled 2019-06-29: qty 100

## 2019-06-29 MED ORDER — LACTATED RINGERS IV BOLUS
500.0000 mL | Freq: Once | INTRAVENOUS | Status: AC
  Administered 2019-06-29: 12:00:00 500 mL via INTRAVENOUS

## 2019-06-29 MED ORDER — METHOCARBAMOL 500 MG PO TABS: 500.0000 mg | ORAL_TABLET | Freq: Four times a day (QID) | ORAL | Status: DC | PRN

## 2019-06-29 MED ORDER — BUPIVACAINE HCL (PF) 0.25 % IJ SOLN
INTRAMUSCULAR | Status: AC
  Filled 2019-06-29: qty 30

## 2019-06-29 MED ORDER — METHOCARBAMOL 500 MG IVPB - SIMPLE MED
500.0000 mg | Freq: Four times a day (QID) | INTRAVENOUS | Status: DC | PRN
  Administered 2019-06-29: 12:00:00 500 mg via INTRAVENOUS

## 2019-06-29 MED ORDER — MEPIVACAINE HCL (PF) 2 % IJ SOLN
INTRAMUSCULAR | Status: DC | PRN
  Administered 2019-06-29: 60 mg via INTRATHECAL

## 2019-06-29 MED ORDER — KETAMINE HCL 10 MG/ML IJ SOLN
INTRAMUSCULAR | Status: DC | PRN
  Administered 2019-06-29: 20 mg via INTRAVENOUS

## 2019-06-29 MED ORDER — DEXAMETHASONE SODIUM PHOSPHATE 10 MG/ML IJ SOLN
10.0000 mg | Freq: Once | INTRAMUSCULAR | Status: AC
  Administered 2019-06-29: 10 mg via INTRAVENOUS

## 2019-06-29 MED ORDER — HYDROCODONE-ACETAMINOPHEN 5-325 MG PO TABS
ORAL_TABLET | ORAL | Status: AC
  Filled 2019-06-29: qty 1

## 2019-06-29 MED ORDER — METHOCARBAMOL 500 MG IVPB - SIMPLE MED
INTRAVENOUS | Status: AC
  Filled 2019-06-29: qty 50

## 2019-06-29 MED ORDER — POLYETHYLENE GLYCOL 3350 17 G PO PACK: 17.0000 g | PACK | Freq: Two times a day (BID) | ORAL | 0 refills | Status: DC

## 2019-06-29 MED ORDER — MEPERIDINE HCL 50 MG/ML IJ SOLN: 6.2500 mg | INTRAMUSCULAR | Status: DC | PRN

## 2019-06-29 MED ORDER — FERROUS SULFATE 325 (65 FE) MG PO TABS: 325.0000 mg | ORAL_TABLET | Freq: Three times a day (TID) | ORAL | 0 refills | Status: DC

## 2019-06-29 MED ORDER — PHENYLEPHRINE 40 MCG/ML (10ML) SYRINGE FOR IV PUSH (FOR BLOOD PRESSURE SUPPORT)
PREFILLED_SYRINGE | INTRAVENOUS | Status: AC
  Filled 2019-06-29: qty 10

## 2019-06-29 MED ORDER — SODIUM CHLORIDE (PF) 0.9 % IJ SOLN
INTRAMUSCULAR | Status: AC
  Filled 2019-06-29: qty 50

## 2019-06-29 MED ORDER — ACETAMINOPHEN 160 MG/5ML PO SOLN: 325.0000 mg | Freq: Once | ORAL | Status: DC | PRN

## 2019-06-29 MED ORDER — TRAMADOL HCL 50 MG PO TABS: 50.0000 mg | ORAL_TABLET | Freq: Four times a day (QID) | ORAL | Status: DC

## 2019-06-29 MED ORDER — POVIDONE-IODINE 10 % EX SWAB
2.0000 "application " | Freq: Once | CUTANEOUS | Status: AC
  Administered 2019-06-29: 2 via TOPICAL

## 2019-06-29 MED ORDER — ONDANSETRON HCL 4 MG/2ML IJ SOLN
INTRAMUSCULAR | Status: AC
  Filled 2019-06-29: qty 2

## 2019-06-29 MED ORDER — ACETAMINOPHEN 325 MG PO TABS: 325.0000 mg | ORAL_TABLET | Freq: Once | ORAL | Status: DC | PRN

## 2019-06-29 MED ORDER — HYDROMORPHONE HCL 1 MG/ML IJ SOLN: 0.5000 mg | INTRAMUSCULAR | Status: DC | PRN

## 2019-06-29 MED ORDER — KETOROLAC TROMETHAMINE 30 MG/ML IJ SOLN
INTRAMUSCULAR | Status: DC | PRN
  Administered 2019-06-29: 30 mg

## 2019-06-29 MED ORDER — TRANEXAMIC ACID-NACL 1000-0.7 MG/100ML-% IV SOLN
1000.0000 mg | Freq: Once | INTRAVENOUS | Status: AC
  Administered 2019-06-29: 14:00:00 1000 mg via INTRAVENOUS

## 2019-06-29 MED ORDER — ACETAMINOPHEN 10 MG/ML IV SOLN: 1000.0000 mg | Freq: Once | INTRAVENOUS | Status: DC | PRN

## 2019-06-29 MED ORDER — KETOROLAC TROMETHAMINE 30 MG/ML IJ SOLN
INTRAMUSCULAR | Status: AC
  Filled 2019-06-29: qty 1

## 2019-06-29 MED ORDER — FENTANYL CITRATE (PF) 100 MCG/2ML IJ SOLN
INTRAMUSCULAR | Status: AC
  Filled 2019-06-29: qty 2

## 2019-06-29 MED ORDER — MIDAZOLAM HCL 2 MG/2ML IJ SOLN
1.0000 mg | INTRAMUSCULAR | Status: DC
  Filled 2019-06-29: qty 2

## 2019-06-29 MED ORDER — PROPOFOL 10 MG/ML IV BOLUS
INTRAVENOUS | Status: DC | PRN
  Administered 2019-06-29: 20 mg via INTRAVENOUS

## 2019-06-29 SURGICAL SUPPLY — 65 items
ADH SKN CLS APL DERMABOND .7 (GAUZE/BANDAGES/DRESSINGS) ×1
ATTUNE MED ANAT PAT 41 KNEE (Knees) ×1 IMPLANT
ATTUNE MED ANAT PAT 41MM KNEE (Knees) ×1 IMPLANT
ATTUNE PS FEM LT SZ 8 CEM KNEE (Femur) ×2 IMPLANT
ATTUNE PSRP INSR SZ8 5 KNEE (Insert) ×1 IMPLANT
ATTUNE PSRP INSR SZ8 5MM KNEE (Insert) ×1 IMPLANT
BAG SPEC THK2 15X12 ZIP CLS (MISCELLANEOUS)
BAG ZIPLOCK 12X15 (MISCELLANEOUS) IMPLANT
BASE TIBIAL ROT PLAT SZ 8 KNEE (Knees) IMPLANT
BLADE SAW SGTL 11.0X1.19X90.0M (BLADE) IMPLANT
BLADE SAW SGTL 13.0X1.19X90.0M (BLADE) ×3 IMPLANT
BLADE SURG SZ10 CARB STEEL (BLADE) ×6 IMPLANT
BNDG ELASTIC 6X5.8 VLCR STR LF (GAUZE/BANDAGES/DRESSINGS) ×3 IMPLANT
BOWL SMART MIX CTS (DISPOSABLE) ×3 IMPLANT
BSPLAT TIB 8 CMNT ROT PLAT STR (Knees) ×1 IMPLANT
CEMENT HV SMART SET (Cement) ×4 IMPLANT
COVER SURGICAL LIGHT HANDLE (MISCELLANEOUS) ×3 IMPLANT
COVER WAND RF STERILE (DRAPES) IMPLANT
CUFF TOURN SGL QUICK 34 (TOURNIQUET CUFF) ×3
CUFF TRNQT CYL 34X4.125X (TOURNIQUET CUFF) ×1 IMPLANT
DECANTER SPIKE VIAL GLASS SM (MISCELLANEOUS) ×6 IMPLANT
DERMABOND ADVANCED (GAUZE/BANDAGES/DRESSINGS) ×2
DERMABOND ADVANCED .7 DNX12 (GAUZE/BANDAGES/DRESSINGS) ×1 IMPLANT
DRAPE U-SHAPE 47X51 STRL (DRAPES) ×3 IMPLANT
DRESSING AQUACEL AG SP 3.5X10 (GAUZE/BANDAGES/DRESSINGS) ×1 IMPLANT
DRSG AQUACEL AG SP 3.5X10 (GAUZE/BANDAGES/DRESSINGS) ×3
DURAPREP 26ML APPLICATOR (WOUND CARE) ×6 IMPLANT
ELECT REM PT RETURN 15FT ADLT (MISCELLANEOUS) ×3 IMPLANT
GLOVE BIO SURGEON STRL SZ 6 (GLOVE) ×3 IMPLANT
GLOVE BIOGEL PI IND STRL 6.5 (GLOVE) ×1 IMPLANT
GLOVE BIOGEL PI IND STRL 7.5 (GLOVE) ×1 IMPLANT
GLOVE BIOGEL PI IND STRL 8.5 (GLOVE) ×1 IMPLANT
GLOVE BIOGEL PI INDICATOR 6.5 (GLOVE) ×2
GLOVE BIOGEL PI INDICATOR 7.5 (GLOVE) ×2
GLOVE BIOGEL PI INDICATOR 8.5 (GLOVE) ×2
GLOVE ECLIPSE 8.0 STRL XLNG CF (GLOVE) ×3 IMPLANT
GLOVE ORTHO TXT STRL SZ7.5 (GLOVE) ×3 IMPLANT
GOWN STRL REUS W/ TWL LRG LVL3 (GOWN DISPOSABLE) ×1 IMPLANT
GOWN STRL REUS W/TWL 2XL LVL3 (GOWN DISPOSABLE) ×3 IMPLANT
GOWN STRL REUS W/TWL LRG LVL3 (GOWN DISPOSABLE) ×6 IMPLANT
HANDPIECE INTERPULSE COAX TIP (DISPOSABLE) ×3
HOLDER FOLEY CATH W/STRAP (MISCELLANEOUS) ×2 IMPLANT
KIT TURNOVER KIT A (KITS) IMPLANT
MANIFOLD NEPTUNE II (INSTRUMENTS) ×3 IMPLANT
NDL SAFETY ECLIPSE 18X1.5 (NEEDLE) IMPLANT
NEEDLE HYPO 18GX1.5 SHARP (NEEDLE) ×3
NS IRRIG 1000ML POUR BTL (IV SOLUTION) ×3 IMPLANT
PACK TOTAL KNEE CUSTOM (KITS) ×3 IMPLANT
PENCIL SMOKE EVACUATOR (MISCELLANEOUS) IMPLANT
PIN FIX SIGMA LCS THRD HI (PIN) ×2 IMPLANT
PIN THREADED HEADED SIGMA (PIN) ×2 IMPLANT
PROTECTOR NERVE ULNAR (MISCELLANEOUS) ×3 IMPLANT
SET HNDPC FAN SPRY TIP SCT (DISPOSABLE) ×1 IMPLANT
SET PAD KNEE POSITIONER (MISCELLANEOUS) ×3 IMPLANT
SUT MNCRL AB 4-0 PS2 18 (SUTURE) ×3 IMPLANT
SUT STRATAFIX PDS+ 0 24IN (SUTURE) ×3 IMPLANT
SUT VIC AB 1 CT1 36 (SUTURE) ×3 IMPLANT
SUT VIC AB 2-0 CT1 27 (SUTURE) ×9
SUT VIC AB 2-0 CT1 TAPERPNT 27 (SUTURE) ×3 IMPLANT
SYR 3ML LL SCALE MARK (SYRINGE) ×3 IMPLANT
TIBIAL BASE ROT PLAT SZ 8 KNEE (Knees) ×3 IMPLANT
TRAY FOLEY MTR SLVR 16FR STAT (SET/KITS/TRAYS/PACK) ×3 IMPLANT
WATER STERILE IRR 1000ML POUR (IV SOLUTION) ×6 IMPLANT
WRAP KNEE MAXI GEL POST OP (GAUZE/BANDAGES/DRESSINGS) ×3 IMPLANT
YANKAUER SUCT BULB TIP 10FT TU (MISCELLANEOUS) ×3 IMPLANT

## 2019-06-29 NOTE — Care Plan (Signed)
Ortho Bundle Case Management Note  Patient Details  Name: Richard [name] MRN: [id number] Date of Birth: 08-16-1940                  Left Total Knee on 06/29/19.  DCP: Home with sister. Lives in 2 story house with 2 steps. MBR on main. DME: Has a 3in1. RW ordered through [place]. PT: [facility] 07/05/19    DME Arranged:  Gilford Rile rolling DME Agency:  [employer]  HH Arranged:    [place] Agency:     Additional Comments: Please contact me with any questions of if this plan should need to change.  Marianne Sofia, RN,CCM [facility]  [phone] 06/29/2019, 1:48 PM

## 2019-06-29 NOTE — Discharge Instructions (Addendum)
INSTRUCTIONS AFTER JOINT REPLACEMENT   o Remove items at home which could result in a fall. This includes throw rugs or furniture in walking pathways o ICE to the affected joint every three hours while awake for 30 minutes at a time, for at least the first 3-5 days, and then as needed for pain and swelling.  Continue to use ice for pain and swelling. You may notice swelling that will progress down to the foot and ankle.  This is normal after surgery.  Elevate your leg when you are not up walking on it.   o Continue to use the breathing machine you got in the hospital (incentive spirometer) which will help keep your temperature down.  It is common for your temperature to cycle up and down following surgery, especially at night when you are not up moving around and exerting yourself.  The breathing machine keeps your lungs expanded and your temperature down.   DIET:  As you were doing prior to hospitalization, we recommend a well-balanced diet.  DRESSING / WOUND CARE / SHOWERING  Keep the surgical dressing until follow up.  The dressing is water proof, so you can shower without any extra covering.  IF THE DRESSING FALLS OFF or the wound gets wet inside, change the dressing with sterile gauze.  Please use Treyon Wymore hand washing techniques before changing the dressing.  Do not use any lotions or creams on the incision until instructed by your surgeon.    ACTIVITY  o Increase activity slowly as tolerated, but follow the weight bearing instructions below.   o No driving for 6 weeks or until further direction given by your physician.  You cannot drive while taking narcotics.  o No lifting or carrying greater than 10 lbs. until further directed by your surgeon. o Avoid periods of inactivity such as sitting longer than an hour when not asleep. This helps prevent blood clots.  o You may return to work once you are authorized by your doctor.     WEIGHT BEARING   Weight bearing as tolerated with assist  device (walker, cane, etc) as directed, use it as long as suggested by your surgeon or therapist, typically at least 4-6 weeks.   EXERCISES  Results after joint replacement surgery are often greatly improved when you follow the exercise, range of motion and muscle strengthening exercises prescribed by your doctor. Safety measures are also important to protect the joint from further injury. Any time any of these exercises cause you to have increased pain or swelling, decrease what you are doing until you are comfortable again and then slowly increase them. If you have problems or questions, call your caregiver or physical therapist for advice.   Rehabilitation is important following a joint replacement. After just a few days of immobilization, the muscles of the leg can become weakened and shrink (atrophy).  These exercises are designed to build up the tone and strength of the thigh and leg muscles and to improve motion. Often times heat used for twenty to thirty minutes before working out will loosen up your tissues and help with improving the range of motion but do not use heat for the first two weeks following surgery (sometimes heat can increase post-operative swelling).   These exercises can be done on a training (exercise) mat, on the floor, on a table or on a bed. Use whatever works the best and is most comfortable for you.    Use music or television while you are exercising so that   the exercises are a pleasant break in your day. This will make your life better with the exercises acting as a break in your routine that you can look forward to.   Perform all exercises about fifteen times, three times per day or as directed.  You should exercise both the operative leg and the other leg as well.  Exercises include:   . Quad Sets - Tighten up the muscle on the front of the thigh (Quad) and hold for 5-10 seconds.   . Straight Leg Raises - With your knee straight (if you were given a brace, keep it on),  lift the leg to 60 degrees, hold for 3 seconds, and slowly lower the leg.  Perform this exercise against resistance later as your leg gets stronger.  . Leg Slides: Lying on your back, slowly slide your foot toward your buttocks, bending your knee up off the floor (only go as far as is comfortable). Then slowly slide your foot back down until your leg is flat on the floor again.  . Angel Wings: Lying on your back spread your legs to the side as far apart as you can without causing discomfort.  . Hamstring Strength:  Lying on your back, push your heel against the floor with your leg straight by tightening up the muscles of your buttocks.  Repeat, but this time bend your knee to a comfortable angle, and push your heel against the floor.  You may put a pillow under the heel to make it more comfortable if necessary.   A rehabilitation program following joint replacement surgery can speed recovery and prevent re-injury in the future due to weakened muscles. Contact your doctor or a physical therapist for more information on knee rehabilitation.    CONSTIPATION  Constipation is defined medically as fewer than three stools per week and severe constipation as less than one stool per week.  Even if you have a regular bowel pattern at home, your normal regimen is likely to be disrupted due to multiple reasons following surgery.  Combination of anesthesia, postoperative narcotics, change in appetite and fluid intake all can affect your bowels.   YOU MUST use at least one of the following options; they are listed in order of increasing strength to get the job done.  They are all available over the counter, and you may need to use some, POSSIBLY even all of these options:    Drink plenty of fluids (prune juice may be helpful) and high fiber foods Colace 100 mg by mouth twice a day  Senokot for constipation as directed and as needed Dulcolax (bisacodyl), take with full glass of water  Miralax (polyethylene glycol)  once or twice a day as needed.  If you have tried all these things and are unable to have a bowel movement in the first 3-4 days after surgery call either your surgeon or your primary doctor.    If you experience loose stools or diarrhea, hold the medications until you stool forms back up.  If your symptoms do not get better within 1 week or if they get worse, check with your doctor.  If you experience "the worst abdominal pain ever" or develop nausea or vomiting, please contact the office immediately for further recommendations for treatment.   ITCHING:  If you experience itching with your medications, try taking only a single pain pill, or even half a pain pill at a time.  You can also use Benadryl over the counter for itching or also to   help with sleep.   TED HOSE STOCKINGS:  Use stockings on both legs until for at least 2 weeks or as directed by physician office. They may be removed at night for sleeping.  MEDICATIONS:  See your medication summary on the "After Visit Summary" that nursing will review with you.  You may have some home medications which will be placed on hold until you complete the course of blood thinner medication.  It is important for you to complete the blood thinner medication as prescribed.  PRECAUTIONS:  If you experience chest pain or shortness of breath - call 911 immediately for transfer to the hospital emergency department.   If you develop a fever greater that 101 F, purulent drainage from wound, increased redness or drainage from wound, foul odor from the wound/dressing, or calf pain - CONTACT YOUR SURGEON.                                                   FOLLOW-UP APPOINTMENTS:  If you do not already have a post-op appointment, please call the office for an appointment to be seen by your surgeon.  Guidelines for how soon to be seen are listed in your "After Visit Summary", but are typically between 1-4 weeks after surgery.  OTHER INSTRUCTIONS:   Knee  Replacement:  Do not place pillow under knee, focus on keeping the knee straight while resting.  MAKE SURE YOU:  . Understand these instructions.  . Get help right away if you are not doing well or get worse.    Thank you for letting us be a part of your medical care team.  It is a privilege we respect greatly.  We hope these instructions will help you stay on track for a fast and full recovery!    General Anesthesia, Adult, Care After This sheet gives you information about how to care for yourself after your procedure. Your health care provider may also give you more specific instructions. If you have problems or questions, contact your health care provider. What can I expect after the procedure? After the procedure, the following side effects are common:  Pain or discomfort at the IV site.  Nausea.  Vomiting.  Sore throat.  Trouble concentrating.  Feeling cold or chills.  Weak or tired.  Sleepiness and fatigue.  Soreness and body aches. These side effects can affect parts of the body that were not involved in surgery. Follow these instructions at home:  For at least 24 hours after the procedure:  Have a responsible adult stay with you. It is important to have someone help care for you until you are awake and alert.  Rest as needed.  Do not: ? Participate in activities in which you could fall or become injured. ? Drive. ? Use heavy machinery. ? Drink alcohol. ? Take sleeping pills or medicines that cause drowsiness. ? Make important decisions or sign legal documents. ? Take care of children on your own. Eating and drinking  Follow any instructions from your health care provider about eating or drinking restrictions.  When you feel hungry, start by eating small amounts of foods that are soft and easy to digest (bland), such as toast. Gradually return to your regular diet.  Drink enough fluid to keep your urine pale yellow.  If you vomit, rehydrate by drinking  water, juice, or clear  broth. General instructions  If you have sleep apnea, surgery and certain medicines can increase your risk for breathing problems. Follow instructions from your health care provider about wearing your sleep device: ? Anytime you are sleeping, including during daytime naps. ? While taking prescription pain medicines, sleeping medicines, or medicines that make you drowsy.  Return to your normal activities as told by your health care provider. Ask your health care provider what activities are safe for you.  Take over-the-counter and prescription medicines only as told by your health care provider.  If you smoke, do not smoke without supervision.  Keep all follow-up visits as told by your health care provider. This is important. Contact a health care provider if:  You have nausea or vomiting that does not get better with medicine.  You cannot eat or drink without vomiting.  You have pain that does not get better with medicine.  You are unable to pass urine.  You develop a skin rash.  You have a fever.  You have redness around your IV site that gets worse. Get help right away if:  You have difficulty breathing.  You have chest pain.  You have blood in your urine or stool, or you vomit blood. Summary  After the procedure, it is common to have a sore throat or nausea. It is also common to feel tired.  Have a responsible adult stay with you for the first 24 hours after general anesthesia. It is important to have someone help care for you until you are awake and alert.  When you feel hungry, start by eating small amounts of foods that are soft and easy to digest (bland), such as toast. Gradually return to your regular diet.  Drink enough fluid to keep your urine pale yellow.  Return to your normal activities as told by your health care provider. Ask your health care provider what activities are safe for you. This information is not intended to replace  advice given to you by your health care provider. Make sure you discuss any questions you have with your health care provider. Document Released: 09/23/2000 Document Revised: 06/20/2017 Document Reviewed: 01/31/2017 Elsevier Patient Education  2020 [employer].

## 2019-06-29 NOTE — Anesthesia Procedure Notes (Signed)
Spinal  Start time: 06/29/2019 9:50 AM End time: 06/29/2019 9:52 AM Staffing Performed: anesthesiologist  Anesthesiologist: Effie Berkshire, MD Preanesthetic Checklist Completed: patient identified, IV checked, site marked, risks and benefits discussed, surgical consent, monitors and equipment checked, pre-op evaluation and timeout performed Spinal Block Patient position: sitting Prep: DuraPrep and site prepped and draped Location: L3-4 Injection technique: single-shot Needle Needle type: Pencan  Needle gauge: 24 G Needle length: 10 cm Needle insertion depth: 10 cm Additional Notes Patient tolerated well. No immediate complications.

## 2019-06-29 NOTE — Transfer of Care (Signed)
Immediate Anesthesia Transfer of Care Note  Patient: Richard [name]  Procedure(s) Performed: Procedure(s) with comments: TOTAL KNEE ARTHROPLASTY (Left) - 70 mins  Patient Location: PACU  Anesthesia Type:MAC, Regional and Spinal  Level of Consciousness: Patient easily awoken, sedated, comfortable, cooperative, following commands, responds to stimulation.   Airway & Oxygen Therapy: Patient spontaneously breathing, ventilating well, oxygen via simple oxygen mask.  Post-op Assessment: Report given to PACU RN, vital signs reviewed and stable, moving all extremities.   Post vital signs: Reviewed and stable.  Complications: No apparent anesthesia complications  Last Vitals:  Vitals Value Taken Time  BP 129/87 06/29/19 1145  Temp    Pulse 60 06/29/19 1149  Resp 19 06/29/19 1149  SpO2 98 % 06/29/19 1149  Vitals shown include unvalidated device data.  Last Pain:  Vitals:   06/29/19 1145  TempSrc:   PainSc: (P) 0-No pain      Patients Stated Pain Goal: 6 (67/61/95 0932)  Complications: No apparent anesthesia complications

## 2019-06-29 NOTE — Anesthesia Procedure Notes (Signed)
Procedure Name: MAC Date/Time: 06/29/2019 9:49 AM Performed by: Deliah Boston, CRNA Pre-anesthesia Checklist: Patient identified, Emergency Drugs available, Suction available and Patient being monitored Patient Re-evaluated:Patient Re-evaluated prior to induction Oxygen Delivery Method: Simple face mask Preoxygenation: Pre-oxygenation with 100% oxygen Induction Type: IV induction Dental Injury: Teeth and Oropharynx as per pre-operative assessment

## 2019-06-29 NOTE — Evaluation (Signed)
Physical Therapy Evaluation Patient Details Name: Richard [name] MRN: [id number] DOB: 04/10/1941 Today's Date: 06/29/2019   History of Present Illness  Pt is 78 y.o. male s/p Lt TKA on 06/29/19 with PMH significant for DM< HTN, HLD, CAD, OA, CABG, pacemaker.  Clinical Impression  Richard [name] is a 78 y.o. male POD 0 s/p Lt TKA. Patient reports independence with mobility at baseline. Patient is now limited by functional impairments (see PT problem list below) and requires supervision for transfers and gait with RW. Patient was able to ambulate ~100 feet with RW and min guard/supervision and cues for safe walker management. Patient educated on safe sequencing for stair mobility and verbalized safe guarding position for people assisting with mobility. Patient instructed in exercises to facilitate ROM and circulation. Pt's sister present for entire session and pt/sister demonstrated competence with safe guarding for gait and stair mobility. Patient will benefit from continued skilled PT interventions to address impairments and progress towards PLOF. Patient has met mobility goals at adequate level for discharge home; will continue to follow if pt continues acute stay to progress towards Mod I goals.    Follow Up Recommendations Follow surgeon's recommendation for DC plan and follow-up therapies    Equipment Recommendations  Rolling walker with 5" wheels(delievered to pt in PACU)    Recommendations for Other Services       Precautions / Restrictions Precautions Precautions: Fall Restrictions Weight Bearing Restrictions: No      Mobility  Bed Mobility Overal bed mobility: Needs Assistance Bed Mobility: Supine to Sit     Supine to sit: HOB elevated;Supervision     General bed mobility comments: no cues or assist required  Transfers Overall transfer level: Needs assistance Equipment used: Rolling walker (2 wheeled) Transfers: Sit to/from Stand Sit to Stand: Supervision          General transfer comment: cues for safe hand placement and technique with RW, no assist required to initiate power up  Ambulation/Gait Ambulation/Gait assistance: Supervision Gait Distance (Feet): 100 Feet Assistive device: Rolling walker (2 wheeled) Gait Pattern/deviations: Step-through pattern;Decreased stride length;Decreased step length - right;Decreased stance time - left Gait velocity: decreased   General Gait Details: verbal cues for safe hand placement and proximity to RW, pt maintained throuhgout. pt's sister present for mobility and educated on safe guarding position. pt/family demonstrated competance with safe gait and guarding position. no overt LOB noted.  Stairs Stairs: Yes Stairs assistance: Supervision Stair Management: No rails;One rail Left;With walker;Step to pattern;Backwards;Forwards Number of Stairs: 12(4x 3) General stair comments: 2x for backward stair negotiation with RW to ascend, cues for safe step pattern and guarding position/assist from pt's sister. on second time pt and family able to sequence without cues. 2x 3 stairs forwards wtih 1 rail and RW, verbal cues for safe guardingand assist from pt's sister, on second attempt pt/family demonstrated safe technique.  Wheelchair Mobility    Modified Rankin (Stroke Patients Only)       Balance Overall balance assessment: Needs assistance Sitting-balance support: Feet supported;No upper extremity supported Sitting balance-Leahy Scale: Good     Standing balance support: During functional activity;Bilateral upper extremity supported Standing balance-Leahy Scale: Fair            Pertinent Vitals/Pain Pain Assessment: 0-10 Pain Score: 0-No pain Pain Location: Lt knee Pain Descriptors / Indicators: Aching Pain Intervention(s): Limited activity within patient's tolerance;Monitored during session    Home Living Family/patient expects to be discharged to:: Private residence Living Arrangements:  Alone Available Help  at Discharge: Family;Available 24 hours/day(pt is going to stay with his sister for as long as needed to recover) Type of Home: House Home Access: Stairs to enter Entrance Stairs-Rails: None Entrance Stairs-Number of Steps: 2 Home Layout: Two level;Bed/bath upstairs Home Equipment: Toilet riser Additional Comments: pt will stay with his sister and her husband. he has to get to second floor for bed/bath in her home and his own.    Prior Function Level of Independence: Independent         Comments: pt independent at baseline with no device, he has been golfing 3x/week and doing exercises to get his muscles stronger prior to surgery     Hand Dominance   Dominant Hand: Right    Extremity/Trunk Assessment   Upper Extremity Assessment Upper Extremity Assessment: Overall WFL for tasks assessed    Lower Extremity Assessment Lower Extremity Assessment: Overall WFL for tasks assessed;LLE deficits/detail LLE Deficits / Details: no extensor lag with SLR and good quad activation, 4/5 for quad strength with MMT LLE Sensation: WNL LLE Coordination: WNL    Cervical / Trunk Assessment Cervical / Trunk Assessment: Normal  Communication   Communication: No difficulties  Cognition Arousal/Alertness: Awake/alert Behavior During Therapy: WFL for tasks assessed/performed Overall Cognitive Status: Within Functional Limits for tasks assessed             General Comments      Exercises Total Joint Exercises Ankle Circles/Pumps: AROM;10 reps;Supine;Seated Quad Sets: AROM;5 reps;Seated;Left Short Arc Quad: AROM;5 reps;Left;Seated Heel Slides: AAROM;5 reps;Seated;Left Hip ABduction/ADduction: AROM;5 reps;Seated;Left Straight Leg Raises: AROM;5 reps;Seated;Left Long Arc Quad: AROM;5 reps;Seated;Left Knee Flexion: AROM;10 reps;AAROM;Seated;Left(1x5 AROM ,1x5 AAROM)   Assessment/Plan    PT Assessment Patient needs continued PT services  PT Problem List Decreased  strength;Decreased activity tolerance;Decreased balance;Decreased range of motion;Decreased mobility;Decreased knowledge of use of DME       PT Treatment Interventions DME instruction;Therapeutic activities;Gait training;Stair training;Therapeutic exercise;Functional mobility training;Balance training;Patient/family education    PT Goals (Current goals can be found in the Care Plan section)  Acute Rehab PT Goals Patient Stated Goal: to get back to playing golf PT Goal Formulation: With patient Time For Goal Achievement: 07/06/19 Potential to Achieve Goals: Good    Frequency 7X/week    AM-PAC PT "6 Clicks" Mobility  Outcome Measure Help needed turning from your back to your side while in a flat bed without using bedrails?: None Help needed moving from lying on your back to sitting on the side of a flat bed without using bedrails?: None Help needed moving to and from a bed to a chair (including a wheelchair)?: A Little Help needed standing up from a chair using your arms (e.g., wheelchair or bedside chair)?: A Little Help needed to walk in hospital room?: A Little Help needed climbing 3-5 steps with a railing? : A Little 6 Click Score: 20    End of Session Equipment Utilized During Treatment: Gait belt Activity Tolerance: Patient tolerated treatment well Patient left: with call bell/phone within reach;in chair;with family/visitor present Nurse Communication: Mobility status PT Visit Diagnosis: Muscle weakness (generalized) (M62.81);Difficulty in walking, not elsewhere classified (R26.2)    Time: 5956-3875 PT Time Calculation (min) (ACUTE ONLY): 40 min   Charges:   PT Evaluation $PT Eval Low Complexity: 1 Low PT Treatments $Gait Training: 8-22 mins $Therapeutic Exercise: 8-22 mins        Gwynneth Albright PT, DPT Physical Therapist with [facility]  06/29/2019 3:43 PM

## 2019-06-29 NOTE — Op Note (Signed)
NAME:  Richard [name]                      MEDICAL RECORD NO.:  [id number]                             FACILITY:  [facility]      PHYSICIAN:  Pietro Cassis. Alvan Dame, M.D.  DATE OF BIRTH:  1941/05/15      DATE OF PROCEDURE:  06/29/2019                                     OPERATIVE REPORT         PREOPERATIVE DIAGNOSIS:  Left knee osteoarthritis.      POSTOPERATIVE DIAGNOSIS:  Left knee osteoarthritis.      FINDINGS:  The patient was noted to have complete loss of cartilage and   bone-on-bone arthritis with associated osteophytes in the medial and patellofemoral compartments of   the knee with a significant synovitis and associated effusion.  The patient had failed months of conservative treatment including medications, injection therapy, activity modification.     PROCEDURE:  Left total knee replacement.      COMPONENTS USED:  DePuy 8 rotating platform posterior stabilized knee   system, a size 8 femur, size 5 tibia, size 5 mm PS AOX insert, and 41 anatomic patellar   button.      SURGEON:  Pietro Cassis. Alvan Dame, M.D.      ASSISTANT:  Danae Orleans, PA-C.      ANESTHESIA:  Regional and Spinal.      SPECIMENS:  None.      COMPLICATION:  None.      DRAINS:  None.  EBL: <200cc      TOURNIQUET TIME:   Total Tourniquet Time Documented: Thigh (Left) - 35 minutes Total: Thigh (Left) - 35 minutes      The patient was stable to the recovery room.      INDICATION FOR PROCEDURE:  Richard [name] is a 78 y.o. male patient of   mine.  The patient had been seen, evaluated, and treated for months conservatively in the   office with medication, activity modification, and injections.  The patient had   radiographic changes of bone-on-bone arthritis with endplate sclerosis and osteophytes noted.  Based on the radiographic changes and failed conservative measures, the patient   decided to proceed with definitive treatment, total knee replacement.  Risks of infection, DVT, component failure, need for  revision surgery, neurovascular injury were reviewed in the office setting.  The postop course was reviewed stressing the efforts to maximize post-operative satisfaction and function.  Consent was obtained for benefit of pain   relief.      PROCEDURE IN DETAIL:  The patient was brought to the operative theater.   Once adequate anesthesia, preoperative antibiotics, 2 gm of Ancef,1 gm of Tranexamic Acid, and 10 mg of Decadron administered, the patient was positioned supine with a left thigh tourniquet placed.  The  left lower extremity was prepped and draped in sterile fashion.  A time-   out was performed identifying the patient, planned procedure, and the appropriate extremity.      The left lower extremity was placed in the [facility] leg holder.  The leg was   exsanguinated, tourniquet elevated to 250 mmHg.  A midline incision was   made followed  by median parapatellar arthrotomy.  Following initial   exposure, attention was first directed to the patella.  Precut   measurement was noted to be 25 mm.  I resected down to 14-15 mm and used a   41 anatomic patellar button to restore patellar height as well as cover the cut surface.      The lug holes were drilled and a metal shim was placed to protect the   patella from retractors and saw blade during the procedure.      At this point, attention was now directed to the femur.  The femoral   canal was opened with a drill, irrigated to try to prevent fat emboli.  An   intramedullary rod was passed at 5 degrees valgus, 9 mm of bone was   resected off the distal femur.  Following this resection, the tibia was   subluxated anteriorly.  Using the extramedullary guide, 2 mm of bone was resected off   the proximal medial tibia.  We confirmed the gap would be   stable medially and laterally with a size 5 spacer block as well as confirmed that the tibial cut was perpendicular in the coronal plane, checking with an alignment rod.      Once this was done, I  sized the femur to be a size 8 in the anterior-   posterior dimension, chose a standard component based on medial and   lateral dimension.  The size 5 rotation block was then pinned in   position anterior referenced using the C-clamp to set rotation.  The   anterior, posterior, and  chamfer cuts were made without difficulty nor   notching making certain that I was along the anterior cortex to help   with flexion gap stability.      The final box cut was made off the lateral aspect of distal femur.      At this point, the tibia was sized to be a size 8.  The size 8 tray was   then pinned in position through the medial third of the tubercle,   drilled, and keel punched.  Trial reduction was now carried with a 8 femur,  8 tibia, a size 5 mm PS insert, and the 8 anatomic patella botton.  The knee was brought to full extension with good flexion stability with the patella   tracking through the trochlea without application of pressure.  Given   all these findings the trial components removed.  Final components were   opened and cement was mixed.  The knee was irrigated with normal saline solution and pulse lavage.  The synovial lining was   then injected with 30 cc of 0.25% Marcaine with epinephrine, 1 cc of Toradol and 30 cc of NS for a total of 61 cc.     Final implants were then cemented onto cleaned and dried cut surfaces of bone with the knee brought to extension with a size 5 mm PS trial insert.      Once the cement had fully cured, excess cement was removed   throughout the knee.  I confirmed that I was satisfied with the range of   motion and stability, and the final size 5 mm PS AOX insert was chosen.  It was   placed into the knee.      The tourniquet had been let down at 35 minutes.  No significant   hemostasis was required.  The extensor mechanism was then reapproximated using #1 Vicryl and #1  Stratafix sutures with the knee   in flexion.  The   remaining wound was closed with 2-0  Vicryl and running 4-0 Monocryl.   The knee was cleaned, dried, dressed sterilely using Dermabond and   Aquacel dressing.  The patient was then   brought to recovery room in stable condition, tolerating the procedure   well.   Please note that Physician Assistant, Danae Orleans, PA-C was present for the entirety of the case, and was utilized for pre-operative positioning, peri-operative retractor management, general facilitation of the procedure and for primary wound closure at the end of the case.              Pietro Cassis Alvan Dame, M.D.    06/29/2019 11:15 AM

## 2019-06-29 NOTE — Anesthesia Postprocedure Evaluation (Signed)
Anesthesia Post Note  Patient: Richard [name]  Procedure(s) Performed: TOTAL KNEE ARTHROPLASTY (Left Knee)     Patient location during evaluation: PACU Anesthesia Type: Spinal Level of consciousness: oriented and awake and alert Pain management: pain level controlled Vital Signs Assessment: post-procedure vital signs reviewed and stable Respiratory status: spontaneous breathing, respiratory function stable and patient connected to nasal cannula oxygen Cardiovascular status: blood pressure returned to baseline and stable Postop Assessment: no headache, no backache and no apparent nausea or vomiting Anesthetic complications: no    Last Vitals:  Vitals:   06/29/19 1315 06/29/19 1324  BP: (!) 137/59 (!) 120/53  Pulse: 66 63  Resp: 18 16  Temp:  36.7 C  SpO2: 98% 94%    Last Pain:  Vitals:   06/29/19 1324  TempSrc:   PainSc: [place]

## 2019-06-29 NOTE — Progress Notes (Signed)
Assisted Dr. Hollis with left, ultrasound guided, adductor canal block. Side rails up, monitors on throughout procedure. See vital signs in flow sheet. Tolerated Procedure well.  

## 2019-06-29 NOTE — Interval H&P Note (Signed)
History and Physical Interval Note:  06/29/2019 8:40 AM  Richard [name]  has presented today for surgery, with the diagnosis of Left knee osteoarthritis.  The various methods of treatment have been discussed with the patient and family. After consideration of risks, benefits and other options for treatment, the patient has consented to  Procedure(s) with comments: TOTAL KNEE ARTHROPLASTY (Left) - 70 mins as a surgical intervention.  The patient's history has been reviewed, patient examined, no change in status, stable for surgery.  I have reviewed the patient's chart and labs.  Questions were answered to the patient's satisfaction.     Mauri Pole

## 2019-06-29 NOTE — Anesthesia Procedure Notes (Signed)
Anesthesia Regional Block: Adductor canal block   Pre-Anesthetic Checklist: ,, timeout performed, Correct Patient, Correct Site, Correct Laterality, Correct Procedure, Correct Position, site marked, Risks and benefits discussed,  Surgical consent,  Pre-op evaluation,  At surgeon's request and post-op pain management  Laterality: Left  Prep: chloraprep       Needles:  Injection technique: Single-shot  Needle Type: Echogenic Stimulator Needle     Needle Length: 9cm  Needle Gauge: 21     Additional Needles:   Procedures:,,,, ultrasound used (permanent image in chart),,,,  Narrative:  Start time: 06/29/2019 8:40 AM End time: 06/29/2019 8:50 AM Injection made incrementally with aspirations every 5 mL.  Performed by: Personally  Anesthesiologist: Effie Berkshire, MD  Additional Notes: Patient tolerated the procedure well. Local anesthetic introduced in an incremental fashion under minimal resistance after negative aspirations. No paresthesias were elicited. After completion of the procedure, no acute issues were identified and patient continued to be monitored by RN.

## 2019-06-30 ENCOUNTER — Encounter: Payer: Self-pay | Admitting: *Deleted

## 2019-06-30 NOTE — Addendum Note (Signed)
Addendum  created 06/30/19 0945 by Effie Berkshire, MD   Attestation recorded in [place], [place] filed

## 2019-07-01 ENCOUNTER — Telehealth: Payer: Self-pay | Admitting: *Deleted

## 2019-07-01 NOTE — Telephone Encounter (Signed)
Richard [name] -- can you help with this?  Received fax from [facility] stating:  Prior authorization needed for Medicare Part B. Insurance only allows 100 strips / lancets every 3 months.

## 2019-07-05 DIAGNOSIS — M25562 Pain in left knee: Secondary | ICD-10-CM | POA: Diagnosis not present

## 2019-07-05 DIAGNOSIS — M25662 Stiffness of left knee, not elsewhere classified: Secondary | ICD-10-CM | POA: Diagnosis not present

## 2019-07-12 DIAGNOSIS — M25562 Pain in left knee: Secondary | ICD-10-CM | POA: Diagnosis not present

## 2019-07-12 DIAGNOSIS — M25662 Stiffness of left knee, not elsewhere classified: Secondary | ICD-10-CM | POA: Diagnosis not present

## 2019-07-14 DIAGNOSIS — M25662 Stiffness of left knee, not elsewhere classified: Secondary | ICD-10-CM | POA: Diagnosis not present

## 2019-07-14 DIAGNOSIS — M25562 Pain in left knee: Secondary | ICD-10-CM | POA: Diagnosis not present

## 2019-07-16 DIAGNOSIS — M25562 Pain in left knee: Secondary | ICD-10-CM | POA: Diagnosis not present

## 2019-07-16 DIAGNOSIS — M25662 Stiffness of left knee, not elsewhere classified: Secondary | ICD-10-CM | POA: Diagnosis not present

## 2019-07-20 DIAGNOSIS — M25662 Stiffness of left knee, not elsewhere classified: Secondary | ICD-10-CM | POA: Diagnosis not present

## 2019-07-22 DIAGNOSIS — M25562 Pain in left knee: Secondary | ICD-10-CM | POA: Diagnosis not present

## 2019-07-22 DIAGNOSIS — M25662 Stiffness of left knee, not elsewhere classified: Secondary | ICD-10-CM | POA: Diagnosis not present

## 2019-07-26 DIAGNOSIS — M25562 Pain in left knee: Secondary | ICD-10-CM | POA: Diagnosis not present

## 2019-07-26 DIAGNOSIS — M25662 Stiffness of left knee, not elsewhere classified: Secondary | ICD-10-CM | POA: Diagnosis not present

## 2019-07-28 DIAGNOSIS — M25562 Pain in left knee: Secondary | ICD-10-CM | POA: Diagnosis not present

## 2019-07-28 DIAGNOSIS — M25662 Stiffness of left knee, not elsewhere classified: Secondary | ICD-10-CM | POA: Diagnosis not present

## 2019-07-29 ENCOUNTER — Ambulatory Visit (INDEPENDENT_AMBULATORY_CARE_PROVIDER_SITE_OTHER): Payer: Medicare Other | Admitting: *Deleted

## 2019-07-29 DIAGNOSIS — I442 Atrioventricular block, complete: Secondary | ICD-10-CM | POA: Diagnosis not present

## 2019-07-29 LAB — CUP PACEART REMOTE DEVICE CHECK
Battery Remaining Longevity: 59 mo
Battery Voltage: 2.96 V
Brady Statistic RA Percent Paced: 0.01 %
Brady Statistic RV Percent Paced: 94.79 %
Date Time Interrogation Session: 20210128040044
Implantable Lead Implant Date: 20190313
Implantable Lead Implant Date: 20190313
Implantable Lead Location: 753859
Implantable Lead Location: 753860
Implantable Lead Model: 3830
Implantable Lead Model: 5076
Implantable Pulse Generator Implant Date: 20190313
Lead Channel Impedance Value: 323 Ohm
Lead Channel Impedance Value: 342 Ohm
Lead Channel Impedance Value: 418 Ohm
Lead Channel Impedance Value: 437 Ohm
Lead Channel Pacing Threshold Amplitude: 1.125 V
Lead Channel Pacing Threshold Pulse Width: 0.4 ms
Lead Channel Sensing Intrinsic Amplitude: 2.5 mV
Lead Channel Sensing Intrinsic Amplitude: 2.5 mV
Lead Channel Sensing Intrinsic Amplitude: 3 mV
Lead Channel Sensing Intrinsic Amplitude: 3 mV
Lead Channel Setting Pacing Amplitude: 3.5 V
Lead Channel Setting Pacing Amplitude: 3.5 V
Lead Channel Setting Pacing Pulse Width: 1 ms
Lead Channel Setting Sensing Sensitivity: 1.2 mV

## 2019-07-29 NOTE — Progress Notes (Signed)
PPM Remote  

## 2019-07-30 DIAGNOSIS — M25562 Pain in left knee: Secondary | ICD-10-CM | POA: Diagnosis not present

## 2019-07-30 DIAGNOSIS — M25662 Stiffness of left knee, not elsewhere classified: Secondary | ICD-10-CM | POA: Diagnosis not present

## 2019-08-02 DIAGNOSIS — M25562 Pain in left knee: Secondary | ICD-10-CM | POA: Diagnosis not present

## 2019-08-04 ENCOUNTER — Telehealth: Payer: Self-pay | Admitting: Cardiology

## 2019-08-04 DIAGNOSIS — M25662 Stiffness of left knee, not elsewhere classified: Secondary | ICD-10-CM | POA: Diagnosis not present

## 2019-08-04 DIAGNOSIS — M25562 Pain in left knee: Secondary | ICD-10-CM | POA: Diagnosis not present

## 2019-08-04 NOTE — Telephone Encounter (Signed)
LMTCB regarding pts request for a letter to get his vaccine. Pt does not need a letter even if he is on a blood thinner.

## 2019-08-04 NOTE — Telephone Encounter (Signed)
Patient is scheduled for his COVID injection on Friday and was told since he is on Eliquis he will need a letter stating it is ok for him to get the injection.  He would like to pick this up Friday morning 08/06/19

## 2019-08-06 DIAGNOSIS — Z23 Encounter for immunization: Secondary | ICD-10-CM | POA: Diagnosis not present

## 2019-08-06 DIAGNOSIS — M25662 Stiffness of left knee, not elsewhere classified: Secondary | ICD-10-CM | POA: Diagnosis not present

## 2019-08-09 ENCOUNTER — Other Ambulatory Visit: Payer: Self-pay

## 2019-08-09 ENCOUNTER — Other Ambulatory Visit: Payer: Self-pay | Admitting: Family Medicine

## 2019-08-09 DIAGNOSIS — E1140 Type 2 diabetes mellitus with diabetic neuropathy, unspecified: Secondary | ICD-10-CM

## 2019-08-10 ENCOUNTER — Ambulatory Visit (INDEPENDENT_AMBULATORY_CARE_PROVIDER_SITE_OTHER): Payer: Medicare Other | Admitting: Family Medicine

## 2019-08-10 ENCOUNTER — Encounter: Payer: Self-pay | Admitting: Family Medicine

## 2019-08-10 VITALS — BP 124/60 | HR 74 | Temp 97.6°F | Resp 18 | Ht 72.0 in | Wt 240.6 lb

## 2019-08-10 DIAGNOSIS — E785 Hyperlipidemia, unspecified: Secondary | ICD-10-CM

## 2019-08-10 DIAGNOSIS — E1169 Type 2 diabetes mellitus with other specified complication: Secondary | ICD-10-CM | POA: Diagnosis not present

## 2019-08-10 DIAGNOSIS — G609 Hereditary and idiopathic neuropathy, unspecified: Secondary | ICD-10-CM

## 2019-08-10 DIAGNOSIS — E11649 Type 2 diabetes mellitus with hypoglycemia without coma: Secondary | ICD-10-CM | POA: Diagnosis not present

## 2019-08-10 DIAGNOSIS — E782 Mixed hyperlipidemia: Secondary | ICD-10-CM | POA: Insufficient documentation

## 2019-08-10 DIAGNOSIS — I10 Essential (primary) hypertension: Secondary | ICD-10-CM | POA: Diagnosis not present

## 2019-08-10 DIAGNOSIS — E119 Type 2 diabetes mellitus without complications: Secondary | ICD-10-CM | POA: Insufficient documentation

## 2019-08-10 LAB — COMPREHENSIVE METABOLIC PANEL
ALT: 13 U/L (ref 0–53)
AST: 18 U/L (ref 0–37)
Albumin: 4.8 g/dL (ref 3.5–5.2)
Alkaline Phosphatase: 65 U/L (ref 39–117)
BUN: 21 mg/dL (ref 6–23)
CO2: 30 mEq/L (ref 19–32)
Calcium: 10.1 mg/dL (ref 8.4–10.5)
Chloride: 98 mEq/L (ref 96–112)
Creatinine, Ser: 1.61 mg/dL — ABNORMAL HIGH (ref 0.40–1.50)
GFR: 41.39 mL/min — ABNORMAL LOW (ref >60.00)
Glucose, Bld: 161 mg/dL — ABNORMAL HIGH (ref 70–99)
Potassium: 4 mEq/L (ref 3.5–5.1)
Sodium: 137 mEq/L (ref 135–145)
Total Bilirubin: 0.6 mg/dL (ref 0.2–1.2)
Total Protein: 7.1 g/dL (ref 6.0–8.3)

## 2019-08-10 LAB — POC URINALSYSI DIPSTICK (AUTOMATED)
Bilirubin, UA: NEGATIVE
Blood, UA: NEGATIVE
Glucose, UA: NEGATIVE
Ketones, UA: NEGATIVE
Leukocytes, UA: NEGATIVE
Nitrite, UA: NEGATIVE
Protein, UA: NEGATIVE
Spec Grav, UA: 1.015 (ref 1.010–1.025)
Urobilinogen, UA: 0.2 E.U./dL
pH, UA: 6.5 (ref 5.0–8.0)

## 2019-08-10 LAB — MICROALBUMIN / CREATININE URINE RATIO
Creatinine,U: 66.2 mg/dL
Microalb Creat Ratio: 1.1 mg/g (ref 0.0–30.0)
Microalb, Ur: 0.7 mg/dL (ref 0.0–1.9)

## 2019-08-10 LAB — LIPID PANEL
Cholesterol: 174 mg/dL (ref 0–200)
HDL: 29.1 mg/dL — ABNORMAL LOW (ref >39.00)
Total CHOL/HDL Ratio: 6
Triglycerides: 439 mg/dL — ABNORMAL HIGH (ref 0.0–149.0)

## 2019-08-10 LAB — HEMOGLOBIN A1C: Hgb A1c MFr Bld: 5.6 % (ref 4.6–6.5)

## 2019-08-10 LAB — LDL CHOLESTEROL, DIRECT: Direct LDL: 93 mg/dL

## 2019-08-10 MED ORDER — GLIMEPIRIDE 1 MG PO TABS: 1.0000 mg | ORAL_TABLET | Freq: Every day | ORAL | 1 refills | Status: DC

## 2019-08-10 MED ORDER — PREGABALIN 150 MG PO CAPS: 150.0000 mg | ORAL_CAPSULE | Freq: Two times a day (BID) | ORAL | 1 refills | Status: DC

## 2019-08-10 NOTE — Patient Instructions (Signed)
Carbohydrate Counting for Diabetes Mellitus, Adult  Carbohydrate counting is a method of keeping track of how many carbohydrates you eat. Eating carbohydrates naturally increases the amount of sugar (glucose) in the blood. Counting how many carbohydrates you eat helps keep your blood glucose within normal limits, which helps you manage your diabetes (diabetes mellitus). It is important to know how many carbohydrates you can safely have in each meal. This is different for every person. A diet and nutrition specialist (registered dietitian) can help you make a meal plan and calculate how many carbohydrates you should have at each meal and snack. Carbohydrates are found in the following foods:  Grains, such as breads and cereals.  Dried beans and soy products.  Starchy vegetables, such as potatoes, peas, and corn.  Fruit and fruit juices.  Milk and yogurt.  Sweets and snack foods, such as cake, cookies, candy, chips, and soft drinks. How do I count carbohydrates? There are two ways to count carbohydrates in food. You can use either of the methods or a combination of both. Reading "Nutrition Facts" on packaged food The "Nutrition Facts" list is included on the labels of almost all packaged foods and beverages in the [place] It includes:  The serving size.  Information about nutrients in each serving, including the grams (g) of carbohydrate per serving. To use the "Nutrition Facts":  Decide how many servings you will have.  Multiply the number of servings by the number of carbohydrates per serving.  The resulting number is the total amount of carbohydrates that you will be having. Learning standard serving sizes of other foods When you eat carbohydrate foods that are not packaged or do not include "Nutrition Facts" on the label, you need to measure the servings in order to count the amount of carbohydrates:  Measure the foods that you will eat with a food scale or measuring cup, if  needed.  Decide how many standard-size servings you will eat.  Multiply the number of servings by 15. Most carbohydrate-rich foods have about 15 g of carbohydrates per serving. ? For example, if you eat 8 oz (170 g) of strawberries, you will have eaten 2 servings and 30 g of carbohydrates (2 servings x 15 g = 30 g).  For foods that have more than one food mixed, such as soups and casseroles, you must count the carbohydrates in each food that is included. The following list contains standard serving sizes of common carbohydrate-rich foods. Each of these servings has about 15 g of carbohydrates:   hamburger bun or  English muffin.   oz (15 mL) syrup.   oz (14 g) jelly.  1 slice of bread.  1 six-inch tortilla.  3 oz (85 g) cooked rice or pasta.  4 oz (113 g) cooked dried beans.  4 oz (113 g) starchy vegetable, such as peas, corn, or potatoes.  4 oz (113 g) hot cereal.  4 oz (113 g) mashed potatoes or  of a large baked potato.  4 oz (113 g) canned or frozen fruit.  4 oz (120 mL) fruit juice.  4-6 crackers.  6 chicken nuggets.  6 oz (170 g) unsweetened dry cereal.  6 oz (170 g) plain fat-free yogurt or yogurt sweetened with artificial sweeteners.  8 oz (240 mL) milk.  8 oz (170 g) fresh fruit or one small piece of fruit.  24 oz (680 g) popped popcorn. Example of carbohydrate counting Sample meal  3 oz (85 g) chicken breast.  6 oz (170 g)   brown rice.  4 oz (113 g) corn.  8 oz (240 mL) milk.  8 oz (170 g) strawberries with sugar-free whipped topping. Carbohydrate calculation 1. Identify the foods that contain carbohydrates: ? Rice. ? Corn. ? Milk. ? Strawberries. 2. Calculate how many servings you have of each food: ? 2 servings rice. ? 1 serving corn. ? 1 serving milk. ? 1 serving strawberries. 3. Multiply each number of servings by 15 g: ? 2 servings rice x 15 g = 30 g. ? 1 serving corn x 15 g = 15 g. ? 1 serving milk x 15 g = 15 g. ? 1  serving strawberries x 15 g = 15 g. 4. Add together all of the amounts to find the total grams of carbohydrates eaten: ? 30 g + 15 g + 15 g + 15 g = 75 g of carbohydrates total. Summary  Carbohydrate counting is a method of keeping track of how many carbohydrates you eat.  Eating carbohydrates naturally increases the amount of sugar (glucose) in the blood.  Counting how many carbohydrates you eat helps keep your blood glucose within normal limits, which helps you manage your diabetes.  A diet and nutrition specialist (registered dietitian) can help you make a meal plan and calculate how many carbohydrates you should have at each meal and snack. This information is not intended to replace advice given to you by your health care provider. Make sure you discuss any questions you have with your health care provider. Document Revised: 01/09/2017 Document Reviewed: 11/29/2015 Elsevier Patient Education  2020 [employer]  

## 2019-08-10 NOTE — Assessment & Plan Note (Signed)
Cut amaryl in 1/2----- 1 mg daily See if bs improve

## 2019-08-10 NOTE — Assessment & Plan Note (Signed)
Tolerating statin, encouraged heart healthy diet, avoid trans fats, minimize simple carbs and saturated fats. Increase exercise as tolerated 

## 2019-08-10 NOTE — Assessment & Plan Note (Signed)
lyrica 150 mg bid  Refill sent in

## 2019-08-10 NOTE — Progress Notes (Signed)
Patient ID: Richard [name], male    DOB: 1940-07-25  Age: 79 y.o. MRN: [id number]    Subjective:  Subjective  HPI Richard [name] presents for f/u dm-- he c/o low blood sugars in am.  He is afraid he will not wake up.  He has been eating better and has lost some weight over the last few months.  He is also in pt after his knee replacement   Review of Systems  Constitutional: Negative for fatigue and unexpected weight change.  Respiratory: Negative for cough and shortness of breath.   Cardiovascular: Negative for chest pain and palpitations.    History Past Medical History:  Diagnosis Date  . Anemia   . Arthritis    hips  . Axillary adenopathy 02/25/2017  . Bradycardia    a. holter monitor has demonstrated HRs in 30s and [place]   . CAD (coronary artery disease)    a. s/p CABG 2001  b.  07/28/2017 cath:   Severe three-vessel native CAD with total occlusion of LAD, ramus intermedius, first OM and RCA, patent RIMA to PDA, LIMA to LAD, sequential SVG to ramus intermedius and first OM.    [name] Chronic lower back pain   . Diffuse large B cell lymphoma ([place])   . Diverticulosis   . Esophageal stricture   . GERD (gastroesophageal reflux disease)   . History of gout   . HTN (hypertension)   . Mixed hyperlipidemia   . OSA on CPAP    with 2L O2 at night  . Pancytopenia ([place])    a. related to chemo therapy for B cell lymphoma  . Peptic stricture of esophagus   . Presence of permanent cardiac pacemaker    sees Dr. Valaria Good pacemaker  . Severe aortic stenosis   . Spinal stenosis   . Type II diabetes mellitus ([place])   . Wears dentures    partial upper    He has a past surgical history that includes Vasectomy; Coronary artery bypass graft (03/2000); Esophageal dilation (X 3-4); Esophagogastroduodenoscopy; Flexible sigmoidoscopy; Panendoscopy; Knee arthroscopy (Left, 2011); Shoulder surgery (Right, 08/2010); Colonoscopy w/ biopsies and polypectomy (2013); Upper gi endoscopy (2013);  Myelogram (04/06/13); Lumbar laminectomy/decompression microdiscectomy (Right, 06/17/2013); Back surgery; Appendectomy (~ 1952); Tonsillectomy (~ 1954); Coronary angioplasty (1993); Coronary angioplasty with stent (05/1997); Skin cancer excision (Right); left heart catheterization with coronary/graft angiogram (N/A, 03/16/2014); Cataract extraction w/PHACO (Left, 11/06/2016); Cataract extraction w/PHACO (Right, 12/04/2016); Hydradenitis excision (Left, 02/25/2017); Portacath placement (N/A, 03/06/2017); RIGHT/LEFT HEART CATH AND CORONARY/GRAFT ANGIOGRAPHY (N/A, 07/28/2017); Transcatheter aortic valve replacement, transfemoral (N/A, 09/09/2017); Intraoperative transthoracic echocardiogram (N/A, 09/09/2017); PACEMAKER IMPLANT (N/A, 09/10/2017); Orbital lesion excision (Right, 09/01/2018); Ectropion repair (Right, 09/01/2018); and Total knee arthroplasty (Left, 06/29/2019).   His family history includes Diabetes in his maternal grandmother; Heart attack in his paternal grandmother; Hypertension in his father; Pancreatic cancer in his mother; Stroke in his father and maternal grandmother.He reports that he has never smoked. He has never used smokeless tobacco. He reports that he does not drink alcohol or use drugs.  Current Outpatient Medications on File Prior to Visit  Medication Sig Dispense Refill  . allopurinol (ZYLOPRIM) 300 MG tablet Take 450 mg by mouth daily.     [name] aluminum hydroxide-magnesium carbonate (GAVISCON) 95-358 MG/15ML SUSP Take 15 mLs by mouth as needed for indigestion or heartburn.    [name] amLODipine (NORVASC) 5 MG tablet TAKE 1 TABLET BY MOUTH TWICE A DAY 180 tablet 1  . apixaban (ELIQUIS) 2.5 MG TABS tablet Take 1 tablet (  2.5 mg total) by mouth 2 (two) times daily. 180 tablet 1  . atorvastatin (LIPITOR) 80 MG tablet TAKE ONE TABLET BY MOUTH AT BEDTIME (Patient taking differently: Take 80 mg by mouth at bedtime. ) 90 tablet 0  . azelastine (ASTELIN) 0.1 % nasal spray Place 2 sprays into both nostrils at  bedtime as needed for rhinitis or allergies. 30 mL 3  . esomeprazole (NEXIUM) 40 MG capsule Take 1 capsule (40 mg total) by mouth daily. 30 capsule 5  . ezetimibe (ZETIA) 10 MG tablet TAKE 1 TABLET BY MOUTH EVERY DAY (Patient taking differently: Take 10 mg by mouth daily. ) 90 tablet 3  . fenofibrate 160 MG tablet Take 1 tablet (160 mg total) by mouth daily. 90 tablet 3  . folic acid (FOLVITE) 1 MG tablet TAKE 2 TABLETS BY MOUTH EVERY DAY (Patient taking differently: Take 2 mg by mouth daily. ) 180 tablet 4  . furosemide (LASIX) 40 MG tablet TAKE 3 TABLETS BY MOUTH 2 (TWO) TIMES DAILY. (Patient taking differently: Take 120 mg by mouth 2 (two) times daily. ) 540 tablet 2  . glucose blood (FREESTYLE LITE) test strip Use to test blood sugar once a day.  Dx Code: E11.9 50 strip 1  . KLOR-CON M20 20 MEQ tablet TAKE 2 TABLETS BY MOUTH 2 (TWO) TIMES DAILY. (Patient taking differently: Take 40 mEq by mouth 2 (two) times daily. ) 360 tablet 2  . Lancets (FREESTYLE) lancets USE TWICE A DAY TO CHECK BLOOD SUGAR. DX E11.9 100 each 6  . metFORMIN (GLUCOPHAGE) 1000 MG tablet TAKE 1 & 1/2 TABLETS BY MOUTH EVERY MORNING AND 1 TABLET IN THE EVENING. (Patient taking differently: Take 1,000-1,500 mg by mouth See admin instructions. Take 1500 mg in the morning and 1000 mg in the evening) 225 tablet 1  . metoprolol tartrate (LOPRESSOR) 25 MG tablet TAKE ONE-HALF TABLET BY MOUTH TWICE DAILY (Patient taking differently: Take 12.5 mg by mouth 2 (two) times daily. ) 90 tablet 3  . nitroGLYCERIN (NITROSTAT) 0.4 MG SL tablet PLACE 1 TABLET (0.4 MG TOTAL) UNDER THE TONGUE EVERY 5 (FIVE) MINUTES AS NEEDED FOR CHEST PAIN. 25 tablet 3  . polycarbophil (FIBERCON) 625 MG tablet Take 625 mg by mouth daily.    . psyllium (METAMUCIL) 58.6 % powder Take 1 packet by mouth daily as needed (constipation).     . VASCEPA 1 g CAPS TAKE 2 CAPSULES BY MOUTH TWICE A DAY (Patient taking differently: Take 2 g by mouth 2 (two) times daily. ) 120  capsule 3   Current Facility-Administered Medications on File Prior to Visit  Medication Dose Route Frequency Provider Last Rate Last Admin  . sodium chloride flush (NS) 0.9 % injection 10 mL  10 mL Intravenous PRN [name], MD   10 mL at 05/30/17 0920     Objective:  Objective  Physical Exam Vitals and nursing note reviewed.  Constitutional:      General: He is sleeping.     Appearance: He is well-developed.  HENT:     Head: Normocephalic and atraumatic.  Eyes:     Pupils: Pupils are equal, round, and reactive to light.  Neck:     Thyroid: No thyromegaly.  Cardiovascular:     Rate and Rhythm: Normal rate and regular rhythm.     Heart sounds: Murmur present.  Pulmonary:     Effort: Pulmonary effort is normal. No respiratory distress.     Breath sounds: Normal breath sounds. No wheezing or rales.  Chest:     Chest wall: No tenderness.  Musculoskeletal:        General: No tenderness.     Cervical back: Normal range of motion and neck supple.  Skin:    General: Skin is warm and dry.  Neurological:     Mental Status: He is oriented to person, place, and time.  Psychiatric:        Behavior: Behavior normal.        Thought Content: Thought content normal.        Judgment: Judgment normal.    BP 124/60 (BP Location: Right Arm, Patient Position: Sitting, Cuff Size: Large)   Pulse 74   Temp 97.6 F (36.4 C) (Temporal)   Resp 18   Ht 6' (1.829 m)   Wt 240 lb 9.6 oz (109.1 kg)   SpO2 96%   BMI 32.63 kg/m  Wt Readings from Last 3 Encounters:  08/10/19 240 lb 9.6 oz (109.1 kg)  06/29/19 252 lb 9.6 oz (114.6 kg)  06/22/19 255 lb 6.4 oz (115.8 kg)     Lab Results  Component Value Date   WBC 7.6 06/22/2019   HGB 12.3 (L) 06/22/2019   HCT 39.4 06/22/2019   PLT 200 06/22/2019   GLUCOSE 209 (H) 06/22/2019   CHOL 136 02/25/2019   TRIG (H) 02/25/2019    516.0 Triglyceride is over 400; calculations on Lipids are invalid.   HDL 26.00 (L) 02/25/2019   LDLDIRECT  53.0 02/25/2019   LDLCALC 57 11/21/2016   ALT 12 06/18/2019   AST 17 06/18/2019   NA 137 06/22/2019   K 3.9 06/22/2019   CL 101 06/22/2019   CREATININE 1.72 (H) 06/22/2019   BUN 27 (H) 06/22/2019   CO2 25 06/22/2019   TSH 2.450 07/27/2018   PSA 0.65 09/27/2013   INR 1.15 11/15/2017   HGBA1C 6.5 (H) 06/22/2019   MICROALBUR 0.4 03/15/2016    No results found.   Assessment & Plan:  Plan  I have discontinued Visente L. Sollenberger's glimepiride, ferrous sulfate, docusate sodium, polyethylene glycol, methocarbamol, and HYDROcodone-acetaminophen. I am also having him start on glimepiride. Additionally, I am having him maintain his allopurinol, esomeprazole, polycarbophil, psyllium, nitroGLYCERIN, fenofibrate, Klor-Con M20, furosemide, azelastine, folic acid, metoprolol tartrate, atorvastatin, ezetimibe, metFORMIN, apixaban, Vascepa, FREESTYLE LITE, aluminum hydroxide-magnesium carbonate, amLODipine, freestyle, and pregabalin.  Meds ordered this encounter  Medications  . glimepiride (AMARYL) 1 MG tablet    Sig: Take 1 tablet (1 mg total) by mouth daily with breakfast.    Dispense:  90 tablet    Refill:  1  . DISCONTD: pregabalin (LYRICA) 150 MG capsule    Sig: Take 1 capsule (150 mg total) by mouth 2 (two) times daily.    Dispense:  180 capsule    Refill:  1  . pregabalin (LYRICA) 150 MG capsule    Sig: Take 1 capsule (150 mg total) by mouth 2 (two) times daily.    Dispense:  180 capsule    Refill:  1    Problem List Items Addressed This Visit      Unprioritized   Essential hypertension    Well controlled, no changes to meds. Encouraged heart healthy diet such as the DASH diet and exercise as tolerated.       Hyperlipidemia associated with type 2 diabetes mellitus ([place])    Tolerating statin, encouraged heart healthy diet, avoid trans fats, minimize simple carbs and saturated fats. Increase exercise as tolerated      Relevant Medications  glimepiride (AMARYL) 1 MG tablet    Peripheral neuropathy    lyrica 150 mg bid  Refill sent in      Relevant Medications   pregabalin (LYRICA) 150 MG capsule   Uncontrolled type 2 diabetes mellitus with hypoglycemia without coma ([place]) - Primary    Cut amaryl in 1/2----- 1 mg daily See if bs improve       Relevant Medications   glimepiride (AMARYL) 1 MG tablet   Other Relevant Orders   Hemoglobin A1c   Lipid panel   Comprehensive metabolic panel   Microalbumin / creatinine urine ratio   POCT Urinalysis Dipstick (Automated) (Completed)      Follow-up: Return in about 6 months (around 02/07/2020), or if symptoms worsen or fail to improve, for hypertension, hyperlipidemia, diabetes II.  [name], DO

## 2019-08-10 NOTE — Assessment & Plan Note (Signed)
Well controlled, no changes to meds. Encouraged heart healthy diet such as the DASH diet and exercise as tolerated.  °

## 2019-08-11 ENCOUNTER — Other Ambulatory Visit: Payer: Self-pay | Admitting: Family Medicine

## 2019-08-11 DIAGNOSIS — Z471 Aftercare following joint replacement surgery: Secondary | ICD-10-CM | POA: Diagnosis not present

## 2019-08-11 DIAGNOSIS — Z96652 Presence of left artificial knee joint: Secondary | ICD-10-CM | POA: Diagnosis not present

## 2019-08-11 NOTE — Telephone Encounter (Signed)
Pt just had labs done but has enough until results annotated.

## 2019-08-13 ENCOUNTER — Other Ambulatory Visit: Payer: Self-pay | Admitting: Family Medicine

## 2019-08-13 DIAGNOSIS — E1169 Type 2 diabetes mellitus with other specified complication: Secondary | ICD-10-CM

## 2019-08-13 DIAGNOSIS — E1165 Type 2 diabetes mellitus with hyperglycemia: Secondary | ICD-10-CM

## 2019-08-13 DIAGNOSIS — E781 Pure hyperglyceridemia: Secondary | ICD-10-CM

## 2019-08-16 ENCOUNTER — Telehealth: Payer: Self-pay | Admitting: Family Medicine

## 2019-08-16 ENCOUNTER — Telehealth: Payer: Self-pay | Admitting: Pulmonary Disease

## 2019-08-16 DIAGNOSIS — G4733 Obstructive sleep apnea (adult) (pediatric): Secondary | ICD-10-CM

## 2019-08-16 NOTE — Telephone Encounter (Signed)
Pt requesting or a call back  For labs that he did 2 weeks ago. He said he was unable to get in to his mychart

## 2019-08-16 NOTE — Telephone Encounter (Signed)
Spoke with pt. He is aware that this order will be placed. Nothing further was needed.

## 2019-08-16 NOTE — Telephone Encounter (Signed)
Spoke with pt. States that he is having issues with his CPAP mask. Reports that his mask to irritating the bridge of his nose. He has been having to wear a bandaid on his nose which is causing the mask to leak. Pt would like to try either nasal pillows or a nasal mask.  Judson Roch - please advise. Thanks.

## 2019-08-16 NOTE — Telephone Encounter (Signed)
It is ok to try nasal pillow. Thanks

## 2019-08-17 NOTE — Telephone Encounter (Signed)
Spoke with patient  Verbalized understanding

## 2019-08-17 NOTE — Telephone Encounter (Signed)
Pt requesting or a call back  For labs that he did 2 weeks ago. He said he was unable to get in to his mychart

## 2019-08-23 ENCOUNTER — Other Ambulatory Visit: Payer: Self-pay | Admitting: Cardiology

## 2019-08-27 ENCOUNTER — Other Ambulatory Visit: Payer: Self-pay | Admitting: Cardiology

## 2019-09-02 ENCOUNTER — Inpatient Hospital Stay: Payer: Medicare Other

## 2019-09-02 ENCOUNTER — Telehealth: Payer: Self-pay | Admitting: *Deleted

## 2019-09-02 ENCOUNTER — Other Ambulatory Visit: Payer: Self-pay

## 2019-09-02 ENCOUNTER — Inpatient Hospital Stay: Payer: Medicare Other | Attending: Hematology & Oncology | Admitting: Hematology & Oncology

## 2019-09-02 VITALS — BP 134/71 | HR 88 | Temp 97.1°F | Resp 20 | Wt 252.0 lb

## 2019-09-02 DIAGNOSIS — I10 Essential (primary) hypertension: Secondary | ICD-10-CM | POA: Diagnosis not present

## 2019-09-02 DIAGNOSIS — Z7984 Long term (current) use of oral hypoglycemic drugs: Secondary | ICD-10-CM | POA: Insufficient documentation

## 2019-09-02 DIAGNOSIS — K219 Gastro-esophageal reflux disease without esophagitis: Secondary | ICD-10-CM | POA: Diagnosis not present

## 2019-09-02 DIAGNOSIS — Z7901 Long term (current) use of anticoagulants: Secondary | ICD-10-CM | POA: Diagnosis not present

## 2019-09-02 DIAGNOSIS — C8332 Diffuse large B-cell lymphoma, intrathoracic lymph nodes: Secondary | ICD-10-CM

## 2019-09-02 DIAGNOSIS — D508 Other iron deficiency anemias: Secondary | ICD-10-CM | POA: Insufficient documentation

## 2019-09-02 DIAGNOSIS — Z79899 Other long term (current) drug therapy: Secondary | ICD-10-CM | POA: Diagnosis not present

## 2019-09-02 DIAGNOSIS — E119 Type 2 diabetes mellitus without complications: Secondary | ICD-10-CM | POA: Insufficient documentation

## 2019-09-02 DIAGNOSIS — D500 Iron deficiency anemia secondary to blood loss (chronic): Secondary | ICD-10-CM | POA: Diagnosis not present

## 2019-09-02 DIAGNOSIS — E785 Hyperlipidemia, unspecified: Secondary | ICD-10-CM | POA: Diagnosis not present

## 2019-09-02 DIAGNOSIS — D510 Vitamin B12 deficiency anemia due to intrinsic factor deficiency: Secondary | ICD-10-CM | POA: Insufficient documentation

## 2019-09-02 DIAGNOSIS — Z95828 Presence of other vascular implants and grafts: Secondary | ICD-10-CM

## 2019-09-02 DIAGNOSIS — D518 Other vitamin B12 deficiency anemias: Secondary | ICD-10-CM

## 2019-09-02 DIAGNOSIS — D519 Vitamin B12 deficiency anemia, unspecified: Secondary | ICD-10-CM

## 2019-09-02 LAB — CBC WITH DIFFERENTIAL (CANCER CENTER ONLY)
Abs Immature Granulocytes: 0.05 10*3/uL (ref 0.00–0.07)
Basophils Absolute: 0 10*3/uL (ref 0.0–0.1)
Basophils Relative: 0 %
Eosinophils Absolute: 0.1 10*3/uL (ref 0.0–0.5)
Eosinophils Relative: 2 %
HCT: 36.4 % — ABNORMAL LOW (ref 39.0–52.0)
Hemoglobin: 11.3 g/dL — ABNORMAL LOW (ref 13.0–17.0)
Immature Granulocytes: 1 %
Lymphocytes Relative: 25 %
Lymphs Abs: 1.8 10*3/uL (ref 0.7–4.0)
MCH: 27.2 pg (ref 26.0–34.0)
MCHC: 31 g/dL (ref 30.0–36.0)
MCV: 87.7 fL (ref 80.0–100.0)
Monocytes Absolute: 0.5 10*3/uL (ref 0.1–1.0)
Monocytes Relative: 7 %
Neutro Abs: 4.9 10*3/uL (ref 1.7–7.7)
Neutrophils Relative %: 65 %
Platelet Count: 223 10*3/uL (ref 150–400)
RBC: 4.15 MIL/uL — ABNORMAL LOW (ref 4.22–5.81)
RDW: 17.3 % — ABNORMAL HIGH (ref 11.5–15.5)
WBC Count: 7.4 10*3/uL (ref 4.0–10.5)
nRBC: 0 % (ref 0.0–0.2)

## 2019-09-02 LAB — IRON AND TIBC
Iron: 49 ug/dL (ref 42–163)
Saturation Ratios: 13 % — ABNORMAL LOW (ref 20–55)
TIBC: 388 ug/dL (ref 202–409)
UIBC: 339 ug/dL (ref 117–376)

## 2019-09-02 LAB — FERRITIN: Ferritin: 610 ng/mL — ABNORMAL HIGH (ref 24–336)

## 2019-09-02 LAB — CMP (CANCER CENTER ONLY)
ALT: 10 U/L (ref 0–44)
AST: 15 U/L (ref 15–41)
Albumin: 4.8 g/dL (ref 3.5–5.0)
Alkaline Phosphatase: 52 U/L (ref 38–126)
Anion gap: 12 (ref 5–15)
BUN: 32 mg/dL — ABNORMAL HIGH (ref 8–23)
CO2: 26 mmol/L (ref 22–32)
Calcium: 10.4 mg/dL — ABNORMAL HIGH (ref 8.9–10.3)
Chloride: 100 mmol/L (ref 98–111)
Creatinine: 1.7 mg/dL — ABNORMAL HIGH (ref 0.61–1.24)
GFR, Est AFR Am: 43 mL/min — ABNORMAL LOW (ref >60)
GFR, Estimated: 37 mL/min — ABNORMAL LOW (ref >60)
Glucose, Bld: 146 mg/dL — ABNORMAL HIGH (ref 70–99)
Potassium: 3.9 mmol/L (ref 3.5–5.1)
Sodium: 138 mmol/L (ref 135–145)
Total Bilirubin: 0.5 mg/dL (ref 0.3–1.2)
Total Protein: 7.2 g/dL (ref 6.5–8.1)

## 2019-09-02 LAB — VITAMIN B12: Vitamin B-12: 130 pg/mL — ABNORMAL LOW (ref 180–914)

## 2019-09-02 LAB — LACTATE DEHYDROGENASE: LDH: 183 U/L (ref 98–192)

## 2019-09-02 MED ORDER — SODIUM CHLORIDE 0.9% FLUSH
10.0000 mL | INTRAVENOUS | Status: DC | PRN
  Administered 2019-09-02: 10 mL
  Filled 2019-09-02: qty 10

## 2019-09-02 MED ORDER — CYANOCOBALAMIN 1000 MCG/ML IJ SOLN
INTRAMUSCULAR | Status: AC
  Filled 2019-09-02: qty 1

## 2019-09-02 MED ORDER — HEPARIN SOD (PORK) LOCK FLUSH 100 UNIT/ML IV SOLN
500.0000 [IU] | Freq: Once | INTRAVENOUS | Status: AC | PRN
  Administered 2019-09-02: 500 [IU]
  Filled 2019-09-02: qty 5

## 2019-09-02 MED ORDER — CYANOCOBALAMIN 1000 MCG/ML IJ SOLN
1000.0000 ug | Freq: Once | INTRAMUSCULAR | Status: AC
  Administered 2019-09-02: 1000 ug via INTRAMUSCULAR

## 2019-09-02 MED ORDER — DENOSUMAB 120 MG/1.7ML ~~LOC~~ SOLN
120.0000 mg | Freq: Once | SUBCUTANEOUS | Status: AC
  Administered 2019-09-02: 120 mg via SUBCUTANEOUS

## 2019-09-02 MED ORDER — HEPARIN SOD (PORK) LOCK FLUSH 100 UNIT/ML IV SOLN
500.0000 [IU] | Freq: Once | INTRAVENOUS | Status: DC
  Filled 2019-09-02: qty 5

## 2019-09-02 MED ORDER — SODIUM CHLORIDE 0.9 % IV SOLN
510.0000 mg | Freq: Once | INTRAVENOUS | Status: AC
  Administered 2019-09-02: 510 mg via INTRAVENOUS
  Filled 2019-09-02: qty 510

## 2019-09-02 MED ORDER — DENOSUMAB 120 MG/1.7ML ~~LOC~~ SOLN
SUBCUTANEOUS | Status: AC
  Filled 2019-09-02: qty 1.7

## 2019-09-02 MED ORDER — SODIUM CHLORIDE 0.9% FLUSH
10.0000 mL | INTRAVENOUS | Status: DC | PRN
  Filled 2019-09-02: qty 10

## 2019-09-02 NOTE — Telephone Encounter (Addendum)
-----   Message from [name], MD sent at 09/02/2019  2:52 PM EST ----- Called patient to let him know that the iron is quite low as well as the Vit B12.  He needs a dose of BOTH next week.  Set patient  up for next  Friday!!

## 2019-09-02 NOTE — Patient Instructions (Signed)
Denosumab injection What is this medicine? DENOSUMAB (den oh sue mab) slows bone breakdown. Prolia is used to treat osteoporosis in women after menopause and in men, and in people who are taking corticosteroids for 6 months or more. Xgeva is used to treat a high calcium level due to cancer and to prevent bone fractures and other bone problems caused by multiple myeloma or cancer bone metastases. Xgeva is also used to treat giant cell tumor of the bone. This medicine may be used for other purposes; ask your health care provider or pharmacist if you have questions. COMMON BRAND NAME(S): Prolia, XGEVA What should I tell my health care provider before I take this medicine? They need to know if you have any of these conditions:  dental disease  having surgery or tooth extraction  infection  kidney disease  low levels of calcium or Vitamin D in the blood  malnutrition  on hemodialysis  skin conditions or sensitivity  thyroid or parathyroid disease  an unusual reaction to denosumab, other medicines, foods, dyes, or preservatives  pregnant or trying to get pregnant  breast-feeding How should I use this medicine? This medicine is for injection under the skin. It is given by a health care professional in a hospital or clinic setting. A special MedGuide will be given to you before each treatment. Be sure to read this information carefully each time. For Prolia, talk to your pediatrician regarding the use of this medicine in children. Special care may be needed. For Xgeva, talk to your pediatrician regarding the use of this medicine in children. While this drug may be prescribed for children as young as 13 years for selected conditions, precautions do apply. Overdosage: If you think you have taken too much of this medicine contact a poison control center or emergency room at once. NOTE: This medicine is only for you. Do not share this medicine with others. What if I miss a dose? It is  important not to miss your dose. Call your doctor or health care professional if you are unable to keep an appointment. What may interact with this medicine? Do not take this medicine with any of the following medications:  other medicines containing denosumab This medicine may also interact with the following medications:  medicines that lower your chance of fighting infection  steroid medicines like prednisone or cortisone This list may not describe all possible interactions. Give your health care provider a list of all the medicines, herbs, non-prescription drugs, or dietary supplements you use. Also tell them if you smoke, drink alcohol, or use illegal drugs. Some items may interact with your medicine. What should I watch for while using this medicine? Visit your doctor or health care professional for regular checks on your progress. Your doctor or health care professional may order blood tests and other tests to see how you are doing. Call your doctor or health care professional for advice if you get a fever, chills or sore throat, or other symptoms of a cold or flu. Do not treat yourself. This drug may decrease your body's ability to fight infection. Try to avoid being around people who are sick. You should make sure you get enough calcium and vitamin D while you are taking this medicine, unless your doctor tells you not to. Discuss the foods you eat and the vitamins you take with your health care professional. See your dentist regularly. Brush and floss your teeth as directed. Before you have any dental work done, tell your dentist you are   receiving this medicine. Do not become pregnant while taking this medicine or for 5 months after stopping it. Talk with your doctor or health care professional about your birth control options while taking this medicine. Women should inform their doctor if they wish to become pregnant or think they might be pregnant. There is a potential for serious side  effects to an unborn child. Talk to your health care professional or pharmacist for more information. What side effects may I notice from receiving this medicine? Side effects that you should report to your doctor or health care professional as soon as possible:  allergic reactions like skin rash, itching or hives, swelling of the face, lips, or tongue  bone pain  breathing problems  dizziness  jaw pain, especially after dental work  redness, blistering, peeling of the skin  signs and symptoms of infection like fever or chills; cough; sore throat; pain or trouble passing urine  signs of low calcium like fast heartbeat, muscle cramps or muscle pain; pain, tingling, numbness in the hands or feet; seizures  unusual bleeding or bruising  unusually weak or tired Side effects that usually do not require medical attention (report to your doctor or health care professional if they continue or are bothersome):  constipation  diarrhea  headache  joint pain  loss of appetite  muscle pain  runny nose  tiredness  upset stomach This list may not describe all possible side effects. Call your doctor for medical advice about side effects. You may report side effects to FDA at 1-800-FDA-1088. Where should I keep my medicine? This medicine is only given in a clinic, doctor's office, or other health care setting and will not be stored at home. NOTE: This sheet is a summary. It may not cover all possible information. If you have questions about this medicine, talk to your doctor, pharmacist, or health care provider.  2020 Elsevier/Gold Standard (2017-10-24 16:10:44) Cyanocobalamin, Vitamin B12 injection What is this medicine? CYANOCOBALAMIN (sye an oh koe BAL a min) is a man made form of vitamin B12. Vitamin B12 is used in the growth of healthy blood cells, nerve cells, and proteins in the body. It also helps with the metabolism of fats and carbohydrates. This medicine is used to treat  people who can not absorb vitamin B12. This medicine may be used for other purposes; ask your health care provider or pharmacist if you have questions. COMMON BRAND NAME(S): B-12 Compliance Kit, B-12 Injection Kit, Cyomin, LA-12, Nutri-Twelve, Physicians EZ Use B-12, Primabalt What should I tell my health care provider before I take this medicine? They need to know if you have any of these conditions:  kidney disease  Leber's disease  megaloblastic anemia  an unusual or allergic reaction to cyanocobalamin, cobalt, other medicines, foods, dyes, or preservatives  pregnant or trying to get pregnant  breast-feeding How should I use this medicine? This medicine is injected into a muscle or deeply under the skin. It is usually given by a health care professional in a clinic or doctor's office. However, your doctor may teach you how to inject yourself. Follow all instructions. Talk to your pediatrician regarding the use of this medicine in children. Special care may be needed. Overdosage: If you think you have taken too much of this medicine contact a poison control center or emergency room at once. NOTE: This medicine is only for you. Do not share this medicine with others. What if I miss a dose? If you are given your dose at a  clinic or doctor's office, call to reschedule your appointment. If you give your own injections and you miss a dose, take it as soon as you can. If it is almost time for your next dose, take only that dose. Do not take double or extra doses. What may interact with this medicine?  colchicine  heavy alcohol intake This list may not describe all possible interactions. Give your health care provider a list of all the medicines, herbs, non-prescription drugs, or dietary supplements you use. Also tell them if you smoke, drink alcohol, or use illegal drugs. Some items may interact with your medicine. What should I watch for while using this medicine? Visit your doctor or  health care professional regularly. You may need blood work done while you are taking this medicine. You may need to follow a special diet. Talk to your doctor. Limit your alcohol intake and avoid smoking to get the best benefit. What side effects may I notice from receiving this medicine? Side effects that you should report to your doctor or health care professional as soon as possible:  allergic reactions like skin rash, itching or hives, swelling of the face, lips, or tongue  blue tint to skin  chest tightness, pain  difficulty breathing, wheezing  dizziness  red, swollen painful area on the leg Side effects that usually do not require medical attention (report to your doctor or health care professional if they continue or are bothersome):  diarrhea  headache This list may not describe all possible side effects. Call your doctor for medical advice about side effects. You may report side effects to FDA at 1-800-FDA-1088. Where should I keep my medicine? Keep out of the reach of children. Store at room temperature between 15 and 30 degrees C (59 and 85 degrees F). Protect from light. Throw away any unused medicine after the expiration date. NOTE: This sheet is a summary. It may not cover all possible information. If you have questions about this medicine, talk to your doctor, pharmacist, or health care provider.  2020 Elsevier/Gold Standard (2007-09-28 22:10:20) Ferumoxytol injection What is this medicine? FERUMOXYTOL is an iron complex. Iron is used to make healthy red blood cells, which carry oxygen and nutrients throughout the body. This medicine is used to treat iron deficiency anemia. This medicine may be used for other purposes; ask your health care provider or pharmacist if you have questions. COMMON BRAND NAME(S): Feraheme What should I tell my health care provider before I take this medicine? They need to know if you have any of these conditions:  anemia not caused by  low iron levels  high levels of iron in the blood  magnetic resonance imaging (MRI) test scheduled  an unusual or allergic reaction to iron, other medicines, foods, dyes, or preservatives  pregnant or trying to get pregnant  breast-feeding How should I use this medicine? This medicine is for injection into a vein. It is given by a health care professional in a hospital or clinic setting. Talk to your pediatrician regarding the use of this medicine in children. Special care may be needed. Overdosage: If you think you have taken too much of this medicine contact a poison control center or emergency room at once. NOTE: This medicine is only for you. Do not share this medicine with others. What if I miss a dose? It is important not to miss your dose. Call your doctor or health care professional if you are unable to keep an appointment. What may interact with this  medicine? This medicine may interact with the following medications:  other iron products This list may not describe all possible interactions. Give your health care provider a list of all the medicines, herbs, non-prescription drugs, or dietary supplements you use. Also tell them if you smoke, drink alcohol, or use illegal drugs. Some items may interact with your medicine. What should I watch for while using this medicine? Visit your doctor or healthcare professional regularly. Tell your doctor or healthcare professional if your symptoms do not start to get better or if they get worse. You may need blood work done while you are taking this medicine. You may need to follow a special diet. Talk to your doctor. Foods that contain iron include: whole grains/cereals, dried fruits, beans, or peas, leafy green vegetables, and organ meats (liver, kidney). What side effects may I notice from receiving this medicine? Side effects that you should report to your doctor or health care professional as soon as possible:  allergic reactions like  skin rash, itching or hives, swelling of the face, lips, or tongue  breathing problems  changes in blood pressure  feeling faint or lightheaded, falls  fever or chills  flushing, sweating, or hot feelings  swelling of the ankles or feet Side effects that usually do not require medical attention (report to your doctor or health care professional if they continue or are bothersome):  diarrhea  headache  nausea, vomiting  stomach pain This list may not describe all possible side effects. Call your doctor for medical advice about side effects. You may report side effects to FDA at 1-800-FDA-1088. Where should I keep my medicine? This drug is given in a hospital or clinic and will not be stored at home. NOTE: This sheet is a summary. It may not cover all possible information. If you have questions about this medicine, talk to your doctor, pharmacist, or health care provider.  2020 Elsevier/Gold Standard (2016-08-05 20:21:10)

## 2019-09-02 NOTE — Progress Notes (Signed)
Hematology and Oncology Follow Up Visit  Richard [name] [id number] 1940/08/03 79 y.o. 09/02/2019   Principle Diagnosis:  Diffuse large cell non-Hodgkin's lymphoma (IPI = 3) - NOT "double hit" Pernicious anemia Iron deficiency secondary to bleeding  Current Therapy:   R-CHOP - s/p cycle#8 - completed 08/2017 Vitamin B12 1 mg IM every month Xgeva 120 mg subcu q 3 months - next dose in 11/2019 IV iron with Feraheme.  Last dose given 03//2021   Interim History:  Richard [name] is here today for follow-up.  He had his surgery for the left knee back in December.  He did well with this.  He is doing physical therapy.  He actually might be done with physical therapy.  He has had no problems with nausea or vomiting.  He has complained of some pain associated with the right breast area.  This is right around the nipple.  I palpated this area.  I cannot palpate anything unusual.  There is no firmness.  There is no swelling.  There is no erythema.  There is no nipple discharge.  This is some that we will just have to watch.  He has had no change in bowel or bladder habits.  He has had no bony pain issues outside of the left knee from his surgery.  There has been no fever.  He has had no cough.  Overall, I would say his performance status is ECOG 1.    Medications:  Allergies as of 09/02/2019      Reactions   Benazepril Swelling   angioedema; he is not a candidate for any angiotensin receptor blockers because of this significant allergic reaction. Because of a history of documented adverse serious drug reaction;Medi Alert bracelet  is recommended   Hctz [hydrochlorothiazide] Anaphylaxis, Swelling   Tongue and lip swelling   Aspirin Other (See Comments)   Gastritis, cant take 325 Mg aspirin    Lactose Intolerance (gi) Nausea And Vomiting      Medication List       Accurate as of September 02, 2019 10:02 AM. If you have any questions, ask your nurse or doctor.        allopurinol 300 MG  tablet Commonly known as: ZYLOPRIM Take 450 mg by mouth daily.   aluminum hydroxide-magnesium carbonate 95-358 MG/15ML Susp Commonly known as: GAVISCON Take 15 mLs by mouth as needed for indigestion or heartburn.   amLODipine 5 MG tablet Commonly known as: NORVASC TAKE 1 TABLET BY MOUTH TWICE A DAY   atorvastatin 80 MG tablet Commonly known as: LIPITOR TAKE ONE TABLET BY MOUTH AT BEDTIME   azelastine 0.1 % nasal spray Commonly known as: ASTELIN Place 2 sprays into both nostrils at bedtime as needed for rhinitis or allergies.   Eliquis 2.5 MG Tabs tablet Generic drug: apixaban TAKE 1 TABLET BY MOUTH TWICE A DAY   esomeprazole 40 MG capsule Commonly known as: NexIUM Take 1 capsule (40 mg total) by mouth daily.   ezetimibe 10 MG tablet Commonly known as: ZETIA TAKE 1 TABLET BY MOUTH EVERY DAY   fenofibrate 160 MG tablet Take 1 tablet (160 mg total) by mouth daily.   folic acid 1 MG tablet Commonly known as: FOLVITE TAKE 2 TABLETS BY MOUTH EVERY DAY   freestyle lancets USE TWICE A DAY TO CHECK BLOOD SUGAR. DX E11.9   FREESTYLE LITE test strip Generic drug: glucose blood Use to test blood sugar once a day.  Dx Code: E11.9   furosemide 40 MG tablet  Commonly known as: LASIX TAKE 3 TABLETS BY MOUTH 2 (TWO) TIMES DAILY. What changed: See the new instructions.   glimepiride 1 MG tablet Commonly known as: Amaryl Take 1 tablet (1 mg total) by mouth daily with breakfast.   Klor-Con M20 20 MEQ tablet Generic drug: potassium chloride SA TAKE 2 TABLETS BY MOUTH 2 (TWO) TIMES DAILY. What changed: See the new instructions.   metFORMIN 1000 MG tablet Commonly known as: GLUCOPHAGE TAKE 1 & 1/2 TABLETS BY MOUTH EVERY MORNING AND 1 TABLET IN THE EVENING.   metoprolol tartrate 25 MG tablet Commonly known as: LOPRESSOR TAKE ONE-HALF TABLET BY MOUTH TWICE DAILY   nitroGLYCERIN 0.4 MG SL tablet Commonly known as: NITROSTAT PLACE 1 TABLET (0.4 MG TOTAL) UNDER THE TONGUE  EVERY 5 (FIVE) MINUTES AS NEEDED FOR CHEST PAIN.   polycarbophil 625 MG tablet Commonly known as: FIBERCON Take 625 mg by mouth daily.   pregabalin 150 MG capsule Commonly known as: Lyrica Take 1 capsule (150 mg total) by mouth 2 (two) times daily.   psyllium 58.6 % powder Commonly known as: METAMUCIL Take 1 packet by mouth daily as needed (constipation).   Vascepa 1 g capsule Generic drug: icosapent Ethyl TAKE 2 CAPSULES BY MOUTH TWICE A DAY       Allergies:  Allergies  Allergen Reactions  . Benazepril Swelling    angioedema; he is not a candidate for any angiotensin receptor blockers because of this significant allergic reaction. Because of a history of documented adverse serious drug reaction;Medi Alert bracelet  is recommended  . Hctz [Hydrochlorothiazide] Anaphylaxis and Swelling    Tongue and lip swelling   . Aspirin Other (See Comments)    Gastritis, cant take 325 Mg aspirin   . Lactose Intolerance (Gi) Nausea And Vomiting    Past Medical History, Surgical history, Social history, and Family History were reviewed and updated.  Review of Systems: Review of Systems  Constitutional: Negative.   HENT: Positive for congestion.   Eyes: Negative.   Respiratory: Negative.   Cardiovascular: Negative.   Gastrointestinal: Negative.   Genitourinary: Negative.   Musculoskeletal: Negative.   Skin: Negative.   Neurological: Negative.   Endo/Heme/Allergies: Negative.      Physical Exam:  weight is 252 lb (114.3 kg). His temporal temperature is 97.1 F (36.2 C) (abnormal). His blood pressure is 134/71 and his pulse is 88. His respiration is 20 and oxygen saturation is 98%.   Wt Readings from Last 3 Encounters:  09/02/19 252 lb (114.3 kg)  08/10/19 240 lb 9.6 oz (109.1 kg)  06/29/19 252 lb 9.6 oz (114.6 kg)    Physical Exam Vitals reviewed.  HENT:     Head: Normocephalic and atraumatic.  Eyes:     Pupils: Pupils are equal, round, and reactive to light.   Cardiovascular:     Rate and Rhythm: Normal rate and regular rhythm.     Heart sounds: Normal heart sounds.  Pulmonary:     Effort: Pulmonary effort is normal.     Breath sounds: Normal breath sounds.  Abdominal:     General: Bowel sounds are normal.     Palpations: Abdomen is soft.  Musculoskeletal:        General: No tenderness or deformity. Normal range of motion.     Cervical back: Normal range of motion.  Lymphadenopathy:     Cervical: No cervical adenopathy.  Skin:    General: Skin is warm and dry.     Findings: No erythema or rash.  Neurological:     Mental Status: He is alert and oriented to person, place, and time.  Psychiatric:        Behavior: Behavior normal.        Thought Content: Thought content normal.        Judgment: Judgment normal.     Lab Results  Component Value Date   WBC 7.4 09/02/2019   HGB 11.3 (L) 09/02/2019   HCT 36.4 (L) 09/02/2019   MCV 87.7 09/02/2019   PLT 223 09/02/2019   Lab Results  Component Value Date   FERRITIN 646 (H) 06/18/2019   IRON 75 06/18/2019   TIBC 356 06/18/2019   UIBC 282 06/18/2019   IRONPCTSAT 21 06/18/2019   Lab Results  Component Value Date   RETICCTPCT 1.6 06/18/2019   RBC 4.15 (L) 09/02/2019   RETICCTABS 52.1 11/22/2011   No results found for: [facility]ils Pyle [facility] Lab Results  Component Value Date   IGA 159 03/09/2012   Lab Results  Component Value Date   ALBUMINELP 4.2 07/27/2018   MSPIKE Not Observed 07/27/2018     Chemistry      Component Value Date/Time   NA 137 08/10/2019 1329   NA 136 03/16/2019 0938   NA 144 06/27/2017 0857   K 4.0 08/10/2019 1329   K 3.9 06/27/2017 0857   CL 98 08/10/2019 1329   CL 103 06/27/2017 0857   CO2 30 08/10/2019 1329   CO2 26 06/27/2017 0857   BUN 21 08/10/2019 1329   BUN 34 (H) 03/16/2019 0938   BUN 12 06/27/2017 0857   CREATININE 1.61 (H) 08/10/2019 1329   CREATININE 1.74 (H) 06/18/2019 1036   CREATININE 1.0 06/27/2017 0857       Component Value Date/Time   CALCIUM 10.1 08/10/2019 1329   CALCIUM 9.2 06/27/2017 0857   ALKPHOS 65 08/10/2019 1329   ALKPHOS 128 (H) 06/27/2017 0857   AST 18 08/10/2019 1329   AST 17 06/18/2019 1036   ALT 13 08/10/2019 1329   ALT 12 06/18/2019 1036   ALT 24 06/27/2017 0857   BILITOT 0.6 08/10/2019 1329   BILITOT 0.6 06/18/2019 1036      Impression and Plan: Mr. Bickford is a very pleasant 79 yo caucasian gentleman with diffuse large B-cell lymphoma (not double hit lymphoma).   I do think that he will need iron today.  His MCV is down.  We last saw him back in December as the iron saturation was only 21%.  He will get his B12 today.  He also will get Xgeva today.  We will plan to get him back to see [place] in another month or so just to make sure everything is going well.  [name], MD 3/4/202110:02 AM

## 2019-09-04 DIAGNOSIS — Z23 Encounter for immunization: Secondary | ICD-10-CM | POA: Diagnosis not present

## 2019-09-10 ENCOUNTER — Inpatient Hospital Stay: Payer: Medicare Other

## 2019-09-10 ENCOUNTER — Other Ambulatory Visit: Payer: Self-pay | Admitting: Family Medicine

## 2019-09-10 ENCOUNTER — Other Ambulatory Visit: Payer: Self-pay

## 2019-09-10 VITALS — BP 126/58 | HR 61 | Temp 97.3°F | Resp 18

## 2019-09-10 DIAGNOSIS — C8332 Diffuse large B-cell lymphoma, intrathoracic lymph nodes: Secondary | ICD-10-CM | POA: Diagnosis not present

## 2019-09-10 DIAGNOSIS — D508 Other iron deficiency anemias: Secondary | ICD-10-CM | POA: Diagnosis not present

## 2019-09-10 DIAGNOSIS — I10 Essential (primary) hypertension: Secondary | ICD-10-CM | POA: Diagnosis not present

## 2019-09-10 DIAGNOSIS — D510 Vitamin B12 deficiency anemia due to intrinsic factor deficiency: Secondary | ICD-10-CM | POA: Diagnosis not present

## 2019-09-10 DIAGNOSIS — K219 Gastro-esophageal reflux disease without esophagitis: Secondary | ICD-10-CM | POA: Diagnosis not present

## 2019-09-10 DIAGNOSIS — D519 Vitamin B12 deficiency anemia, unspecified: Secondary | ICD-10-CM

## 2019-09-10 DIAGNOSIS — E785 Hyperlipidemia, unspecified: Secondary | ICD-10-CM | POA: Diagnosis not present

## 2019-09-10 MED ORDER — HEPARIN SOD (PORK) LOCK FLUSH 100 UNIT/ML IV SOLN
500.0000 [IU] | Freq: Once | INTRAVENOUS | Status: AC | PRN
  Administered 2019-09-10: 500 [IU]
  Filled 2019-09-10: qty 5

## 2019-09-10 MED ORDER — SODIUM CHLORIDE 0.9% FLUSH
10.0000 mL | INTRAVENOUS | Status: DC | PRN
  Administered 2019-09-10: 10 mL
  Filled 2019-09-10: qty 10

## 2019-09-10 MED ORDER — SODIUM CHLORIDE 0.9 % IV SOLN
510.0000 mg | Freq: Once | INTRAVENOUS | Status: AC
  Administered 2019-09-10: 14:00:00 510 mg via INTRAVENOUS
  Filled 2019-09-10: qty 510

## 2019-09-10 NOTE — Patient Instructions (Signed)

## 2019-09-21 ENCOUNTER — Other Ambulatory Visit: Payer: Self-pay | Admitting: Family Medicine

## 2019-09-23 ENCOUNTER — Other Ambulatory Visit: Payer: Self-pay | Admitting: Cardiology

## 2019-09-30 DIAGNOSIS — M5416 Radiculopathy, lumbar region: Secondary | ICD-10-CM | POA: Diagnosis not present

## 2019-10-01 ENCOUNTER — Telehealth: Payer: Self-pay | Admitting: Hematology & Oncology

## 2019-10-01 ENCOUNTER — Inpatient Hospital Stay: Payer: Medicare Other

## 2019-10-01 ENCOUNTER — Encounter: Payer: Self-pay | Admitting: Hematology & Oncology

## 2019-10-01 ENCOUNTER — Inpatient Hospital Stay: Payer: Medicare Other | Attending: Hematology & Oncology

## 2019-10-01 ENCOUNTER — Other Ambulatory Visit: Payer: Self-pay

## 2019-10-01 ENCOUNTER — Inpatient Hospital Stay (HOSPITAL_BASED_OUTPATIENT_CLINIC_OR_DEPARTMENT_OTHER): Payer: Medicare Other | Admitting: Hematology & Oncology

## 2019-10-01 ENCOUNTER — Telehealth: Payer: Self-pay | Admitting: *Deleted

## 2019-10-01 VITALS — BP 145/66 | HR 89 | Temp 97.5°F | Resp 17 | Wt 261.0 lb

## 2019-10-01 DIAGNOSIS — Z9221 Personal history of antineoplastic chemotherapy: Secondary | ICD-10-CM | POA: Insufficient documentation

## 2019-10-01 DIAGNOSIS — D511 Vitamin B12 deficiency anemia due to selective vitamin B12 malabsorption with proteinuria: Secondary | ICD-10-CM | POA: Diagnosis not present

## 2019-10-01 DIAGNOSIS — D500 Iron deficiency anemia secondary to blood loss (chronic): Secondary | ICD-10-CM | POA: Diagnosis not present

## 2019-10-01 DIAGNOSIS — D510 Vitamin B12 deficiency anemia due to intrinsic factor deficiency: Secondary | ICD-10-CM | POA: Diagnosis not present

## 2019-10-01 DIAGNOSIS — Z95828 Presence of other vascular implants and grafts: Secondary | ICD-10-CM

## 2019-10-01 DIAGNOSIS — C8332 Diffuse large B-cell lymphoma, intrathoracic lymph nodes: Secondary | ICD-10-CM | POA: Diagnosis not present

## 2019-10-01 DIAGNOSIS — D519 Vitamin B12 deficiency anemia, unspecified: Secondary | ICD-10-CM

## 2019-10-01 DIAGNOSIS — C8330 Diffuse large B-cell lymphoma, unspecified site: Secondary | ICD-10-CM | POA: Insufficient documentation

## 2019-10-01 DIAGNOSIS — D508 Other iron deficiency anemias: Secondary | ICD-10-CM

## 2019-10-01 LAB — IRON AND TIBC
Iron: 71 ug/dL (ref 42–163)
Saturation Ratios: 20 % (ref 20–55)
TIBC: 346 ug/dL (ref 202–409)
UIBC: 275 ug/dL (ref 117–376)

## 2019-10-01 LAB — CBC WITH DIFFERENTIAL (CANCER CENTER ONLY)
Abs Immature Granulocytes: 0.21 10*3/uL — ABNORMAL HIGH (ref 0.00–0.07)
Basophils Absolute: 0 10*3/uL (ref 0.0–0.1)
Basophils Relative: 0 %
Eosinophils Absolute: 0 10*3/uL (ref 0.0–0.5)
Eosinophils Relative: 0 %
HCT: 34.6 % — ABNORMAL LOW (ref 39.0–52.0)
Hemoglobin: 10.9 g/dL — ABNORMAL LOW (ref 13.0–17.0)
Immature Granulocytes: 2 %
Lymphocytes Relative: 14 %
Lymphs Abs: 1.4 10*3/uL (ref 0.7–4.0)
MCH: 27.7 pg (ref 26.0–34.0)
MCHC: 31.5 g/dL (ref 30.0–36.0)
MCV: 87.8 fL (ref 80.0–100.0)
Monocytes Absolute: 0.8 10*3/uL (ref 0.1–1.0)
Monocytes Relative: 8 %
Neutro Abs: 8.1 10*3/uL — ABNORMAL HIGH (ref 1.7–7.7)
Neutrophils Relative %: 76 %
Platelet Count: 193 10*3/uL (ref 150–400)
RBC: 3.94 MIL/uL — ABNORMAL LOW (ref 4.22–5.81)
RDW: 17.5 % — ABNORMAL HIGH (ref 11.5–15.5)
WBC Count: 10.5 10*3/uL (ref 4.0–10.5)
nRBC: 0 % (ref 0.0–0.2)

## 2019-10-01 LAB — CMP (CANCER CENTER ONLY)
ALT: 12 U/L (ref 0–44)
AST: 14 U/L — ABNORMAL LOW (ref 15–41)
Albumin: 4.6 g/dL (ref 3.5–5.0)
Alkaline Phosphatase: 47 U/L (ref 38–126)
Anion gap: 9 (ref 5–15)
BUN: 32 mg/dL — ABNORMAL HIGH (ref 8–23)
CO2: 28 mmol/L (ref 22–32)
Calcium: 9.9 mg/dL (ref 8.9–10.3)
Chloride: 100 mmol/L (ref 98–111)
Creatinine: 1.65 mg/dL — ABNORMAL HIGH (ref 0.61–1.24)
GFR, Est AFR Am: 44 mL/min — ABNORMAL LOW (ref >60)
GFR, Estimated: 38 mL/min — ABNORMAL LOW (ref >60)
Glucose, Bld: 180 mg/dL — ABNORMAL HIGH (ref 70–99)
Potassium: 4 mmol/L (ref 3.5–5.1)
Sodium: 137 mmol/L (ref 135–145)
Total Bilirubin: 0.5 mg/dL (ref 0.3–1.2)
Total Protein: 6.8 g/dL (ref 6.5–8.1)

## 2019-10-01 LAB — RETICULOCYTES
Immature Retic Fract: 32.5 % — ABNORMAL HIGH (ref 2.3–15.9)
RBC.: 3.89 MIL/uL — ABNORMAL LOW (ref 4.22–5.81)
Retic Count, Absolute: 57.6 10*3/uL (ref 19.0–186.0)
Retic Ct Pct: 1.5 % (ref 0.4–3.1)

## 2019-10-01 LAB — FERRITIN: Ferritin: 1125 ng/mL — ABNORMAL HIGH (ref 24–336)

## 2019-10-01 LAB — VITAMIN B12: Vitamin B-12: 143 pg/mL — ABNORMAL LOW (ref 180–914)

## 2019-10-01 MED ORDER — DENOSUMAB 120 MG/1.7ML ~~LOC~~ SOLN: 120.0000 mg | Freq: Once | SUBCUTANEOUS | Status: DC

## 2019-10-01 MED ORDER — DENOSUMAB 120 MG/1.7ML ~~LOC~~ SOLN
SUBCUTANEOUS | Status: AC
  Filled 2019-10-01: qty 1.7

## 2019-10-01 MED ORDER — CYANOCOBALAMIN 1000 MCG/ML IJ SOLN
INTRAMUSCULAR | Status: AC
  Filled 2019-10-01: qty 1

## 2019-10-01 MED ORDER — HEPARIN SOD (PORK) LOCK FLUSH 100 UNIT/ML IV SOLN
500.0000 [IU] | Freq: Once | INTRAVENOUS | Status: AC
  Administered 2019-10-01: 500 [IU] via INTRAVENOUS
  Filled 2019-10-01: qty 5

## 2019-10-01 MED ORDER — CYANOCOBALAMIN 1000 MCG/ML IJ SOLN
1000.0000 ug | Freq: Once | INTRAMUSCULAR | Status: AC
  Administered 2019-10-01: 1000 ug via INTRAMUSCULAR

## 2019-10-01 MED ORDER — SODIUM CHLORIDE 0.9% FLUSH
10.0000 mL | INTRAVENOUS | Status: DC | PRN
  Administered 2019-10-01: 10 mL via INTRAVENOUS
  Filled 2019-10-01: qty 10

## 2019-10-01 NOTE — Patient Instructions (Signed)
Implanted Port Insertion, Care After °This sheet gives you information about how to care for yourself after your procedure. Your health care provider may also give you more specific instructions. If you have problems or questions, contact your health care provider. °What can I expect after the procedure? °After the procedure, it is common to have: °· Discomfort at the port insertion site. °· Bruising on the skin over the port. This should improve over 3-4 days. °Follow these instructions at home: °Port care °· After your port is placed, you will get a manufacturer's information card. The card has information about your port. Keep this card with you at all times. °· Take care of the port as told by your health care provider. Ask your health care provider if you or a family member can get training for taking care of the port at home. A home health care nurse may also take care of the port. °· Make sure to remember what type of port you have. °Incision care ° °  ° °· Follow instructions from your health care provider about how to take care of your port insertion site. Make sure you: °? Wash your hands with soap and water before and after you change your bandage (dressing). If soap and water are not available, use hand sanitizer. °? Change your dressing as told by your health care provider. °? Leave stitches (sutures), skin glue, or adhesive strips in place. These skin closures may need to stay in place for 2 weeks or longer. If adhesive strip edges start to loosen and curl up, you may trim the loose edges. Do not remove adhesive strips completely unless your health care provider tells you to do that. °· Check your port insertion site every day for signs of infection. Check for: °? Redness, swelling, or pain. °? Fluid or blood. °? Warmth. °? Pus or a bad smell. °Activity °· Return to your normal activities as told by your health care provider. Ask your health care provider what activities are safe for you. °· Do not  lift anything that is heavier than 10 lb (4.5 kg), or the limit that you are told, until your health care provider says that it is safe. °General instructions °· Take over-the-counter and prescription medicines only as told by your health care provider. °· Do not take baths, swim, or use a hot tub until your health care provider approves. Ask your health care provider if you may take showers. You may only be allowed to take sponge baths. °· Do not drive for 24 hours if you were given a sedative during your procedure. °· Wear a medical alert bracelet in case of an emergency. This will tell any health care providers that you have a port. °· Keep all follow-up visits as told by your health care provider. This is important. °Contact a health care provider if: °· You cannot flush your port with saline as directed, or you cannot draw blood from the port. °· You have a fever or chills. °· You have redness, swelling, or pain around your port insertion site. °· You have fluid or blood coming from your port insertion site. °· Your port insertion site feels warm to the touch. °· You have pus or a bad smell coming from the port insertion site. °Get help right away if: °· You have chest pain or shortness of breath. °· You have bleeding from your port that you cannot control. °Summary °· Take care of the port as told by your health   care provider. Keep the manufacturer's information card with you at all times. °· Change your dressing as told by your health care provider. °· Contact a health care provider if you have a fever or chills or if you have redness, swelling, or pain around your port insertion site. °· Keep all follow-up visits as told by your health care provider. °This information is not intended to replace advice given to you by your health care provider. Make sure you discuss any questions you have with your health care provider. °Document Revised: 01/13/2018 Document Reviewed: 01/13/2018 °Elsevier Patient Education ©  2020 [employer] ° °

## 2019-10-01 NOTE — Telephone Encounter (Signed)
Appointments scheduled calendar printed per 4/2 los 

## 2019-10-01 NOTE — Patient Instructions (Signed)
Cyanocobalamin, Pyridoxine, and Folate What is this medicine? A multivitamin containing folic acid, vitamin B6, and vitamin B12. This medicine may be used for other purposes; ask your health care provider or pharmacist if you have questions. COMMON BRAND NAME(S): AllanFol RX, AllanTex, Av-Vite FB, B Complex with Folic Acid, ComBgen, FaBB, Folamin, Folastin, Folbalin, Folbee, Folbic, Folcaps, Folgard, Folgard RX, Folgard RX 2.2, Folplex, Folplex 2.2, Foltabs 800, Foltx, Homocysteine Formula, Niva-Fol, NuFol, TL Gard RX, Virt-Gard, Virt-Vite, Virt-Vite Forte, Vita-Respa What should I tell my health care provider before I take this medicine? They need to know if you have any of these conditions:  bleeding or clotting disorder  history of anemia of any type  other chronic health condition  an unusual or allergic reaction to vitamins, other medicines, foods, dyes, or preservatives  pregnant or trying to get pregnant  breast-feeding How should I use this medicine? Take by mouth with a glass of water. May take with food. Follow the directions on the prescription label. It is usually given once a day. Do not take your medicine more often than directed. Contact your pediatrician regarding the use of this medicine in children. Special care may be needed. Overdosage: If you think you have taken too much of this medicine contact a poison control center or emergency room at once. NOTE: This medicine is only for you. Do not share this medicine with others. What if I miss a dose? If you miss a dose, take it as soon as you can. If it is almost time for your next dose, take only that dose. Do not take double or extra doses. What may interact with this medicine?  levodopa This list may not describe all possible interactions. Give your health care provider a list of all the medicines, herbs, non-prescription drugs, or dietary supplements you use. Also tell them if you smoke, drink alcohol, or use illegal  drugs. Some items may interact with your medicine. What should I watch for while using this medicine? See your health care professional for regular checks on your progress. Remember that vitamin supplements do not replace the need for good nutrition from a balanced diet. What side effects may I notice from receiving this medicine? Side effects that you should report to your doctor or health care professional as soon as possible:  allergic reaction such as skin rash or difficulty breathing  vomiting Side effects that usually do not require medical attention (report to your doctor or health care professional if they continue or are bothersome):  nausea  stomach upset This list may not describe all possible side effects. Call your doctor for medical advice about side effects. You may report side effects to FDA at 1-800-FDA-1088. Where should I keep my medicine? Keep out of the reach of children. Most vitamins should be stored at controlled room temperature. Check your specific product directions. Protect from heat and moisture. Throw away any unused medicine after the expiration date. NOTE: This sheet is a summary. It may not cover all possible information. If you have questions about this medicine, talk to your doctor, pharmacist, or health care provider.  2020 Elsevier/Gold Standard (2007-08-08 00:59:55)  

## 2019-10-01 NOTE — Progress Notes (Signed)
Hematology and Oncology Follow Up Visit  Richard [name] [id number] January 13, 1941 79 y.o. 10/01/2019   Principle Diagnosis:  Diffuse large cell non-Hodgkin's lymphoma (IPI = 3) - NOT "double hit" Pernicious anemia Iron deficiency secondary to bleeding  Current Therapy:   R-CHOP - s/p cycle#8 - completed 08/2017 Vitamin B12 1 mg IM every month Xgeva 120 mg subcu q 3 months - next dose in 11/2019 IV iron with Feraheme.  Last dose given 03//2021   Interim History:  Richard [name] is here today for follow-up.  Surprisingly, even though we have given him iron, his hemoglobin is a bit lower.  When I saw him a month ago, his ferritin was 16 with an iron saturation of 13%.  Shockingly, his B12 was only 130.  He gets B12 monthly.  He got IV iron.  He gets his B12.  I am not sure exactly what might be going on.  I suspect that we might be looking at the kidney function as a possibility.  His erythropoietin level is about 30.  He really has no symptoms.  He played golf yesterday.  He feels okay.  He is having no problems with cardiac issues.  His heart valve is working okay.  His knee is doing fine after his surgery.  There is been no problems with obvious bleeding.  He has had no melena or bright red blood per rectum.  Overall, his performance status is ECOG 1.    Medications:  Allergies as of 10/01/2019      Reactions   Benazepril Swelling   angioedema; he is not a candidate for any angiotensin receptor blockers because of this significant allergic reaction. Because of a history of documented adverse serious drug reaction;Medi Alert bracelet  is recommended   Hctz [hydrochlorothiazide] Anaphylaxis, Swelling   Tongue and lip swelling   Aspirin Other (See Comments)   Gastritis, cant take 325 Mg aspirin    Lactose Intolerance (gi) Nausea And Vomiting      Medication List       Accurate as of October 01, 2019 10:19 AM. If you have any questions, ask your nurse or doctor.        allopurinol  300 MG tablet Commonly known as: ZYLOPRIM Take 450 mg by mouth daily.   aluminum hydroxide-magnesium carbonate 95-358 MG/15ML Susp Commonly known as: GAVISCON Take 15 mLs by mouth as needed for indigestion or heartburn.   amLODipine 5 MG tablet Commonly known as: NORVASC TAKE 1 TABLET BY MOUTH TWICE A DAY   atorvastatin 80 MG tablet Commonly known as: LIPITOR TAKE ONE TABLET BY MOUTH AT BEDTIME   azelastine 0.1 % nasal spray Commonly known as: ASTELIN Place 2 sprays into both nostrils at bedtime as needed for rhinitis or allergies.   Eliquis 2.5 MG Tabs tablet Generic drug: apixaban TAKE 1 TABLET BY MOUTH TWICE A DAY   esomeprazole 40 MG capsule Commonly known as: NexIUM Take 1 capsule (40 mg total) by mouth daily.   ezetimibe 10 MG tablet Commonly known as: ZETIA TAKE 1 TABLET BY MOUTH EVERY DAY   fenofibrate 160 MG tablet TAKE 1 TABLET BY MOUTH EVERY DAY   folic acid 1 MG tablet Commonly known as: FOLVITE TAKE 2 TABLETS BY MOUTH EVERY DAY   freestyle lancets USE TWICE A DAY TO CHECK BLOOD SUGAR. DX E11.9   FREESTYLE LITE test strip Generic drug: glucose blood Use to test blood sugar once a day.  Dx Code: E11.9   furosemide 40 MG tablet Commonly  known as: LASIX TAKE 3 TABLETS BY MOUTH 2 (TWO) TIMES DAILY.   glimepiride 1 MG tablet Commonly known as: Amaryl Take 1 tablet (1 mg total) by mouth daily with breakfast.   Klor-Con M20 20 MEQ tablet Generic drug: potassium chloride SA TAKE 2 TABLETS BY MOUTH 2 (TWO) TIMES DAILY.   metFORMIN 1000 MG tablet Commonly known as: GLUCOPHAGE TAKE 1 & 1/2 TABLETS BY MOUTH EVERY MORNING AND 1 TABLET IN THE EVENING.   metoprolol tartrate 25 MG tablet Commonly known as: LOPRESSOR TAKE ONE-HALF TABLET BY MOUTH TWICE DAILY   nitroGLYCERIN 0.4 MG SL tablet Commonly known as: NITROSTAT PLACE 1 TABLET (0.4 MG TOTAL) UNDER THE TONGUE EVERY 5 (FIVE) MINUTES AS NEEDED FOR CHEST PAIN.   polycarbophil 625 MG tablet Commonly  known as: FIBERCON Take 625 mg by mouth daily.   pregabalin 150 MG capsule Commonly known as: Lyrica Take 1 capsule (150 mg total) by mouth 2 (two) times daily.   psyllium 58.6 % powder Commonly known as: METAMUCIL Take 1 packet by mouth daily as needed (constipation).   Vascepa 1 g capsule Generic drug: icosapent Ethyl TAKE 2 CAPSULES BY MOUTH TWICE A DAY       Allergies:  Allergies  Allergen Reactions  . Benazepril Swelling    angioedema; he is not a candidate for any angiotensin receptor blockers because of this significant allergic reaction. Because of a history of documented adverse serious drug reaction;Medi Alert bracelet  is recommended  . Hctz [Hydrochlorothiazide] Anaphylaxis and Swelling    Tongue and lip swelling   . Aspirin Other (See Comments)    Gastritis, cant take 325 Mg aspirin   . Lactose Intolerance (Gi) Nausea And Vomiting    Past Medical History, Surgical history, Social history, and Family History were reviewed and updated.  Review of Systems: Review of Systems  Constitutional: Negative.   HENT: Positive for congestion.   Eyes: Negative.   Respiratory: Negative.   Cardiovascular: Negative.   Gastrointestinal: Negative.   Genitourinary: Negative.   Musculoskeletal: Negative.   Skin: Negative.   Neurological: Negative.   Endo/Heme/Allergies: Negative.      Physical Exam:  weight is 261 lb (118.4 kg). His temporal temperature is 97.5 F (36.4 C) (abnormal). His blood pressure is 145/66 (abnormal) and his pulse is 89. His respiration is 17 and oxygen saturation is 100%.   Wt Readings from Last 3 Encounters:  10/01/19 261 lb (118.4 kg)  09/02/19 252 lb (114.3 kg)  08/10/19 240 lb 9.6 oz (109.1 kg)    Physical Exam Vitals reviewed.  HENT:     Head: Normocephalic and atraumatic.  Eyes:     Pupils: Pupils are equal, round, and reactive to light.  Cardiovascular:     Rate and Rhythm: Normal rate and regular rhythm.     Heart sounds:  Normal heart sounds.  Pulmonary:     Effort: Pulmonary effort is normal.     Breath sounds: Normal breath sounds.  Abdominal:     General: Bowel sounds are normal.     Palpations: Abdomen is soft.  Musculoskeletal:        General: No tenderness or deformity. Normal range of motion.     Cervical back: Normal range of motion.  Lymphadenopathy:     Cervical: No cervical adenopathy.  Skin:    General: Skin is warm and dry.     Findings: No erythema or rash.  Neurological:     Mental Status: He is alert and oriented to person,  place, and time.  Psychiatric:        Behavior: Behavior normal.        Thought Content: Thought content normal.        Judgment: Judgment normal.     Lab Results  Component Value Date   WBC 10.5 10/01/2019   HGB 10.9 (L) 10/01/2019   HCT 34.6 (L) 10/01/2019   MCV 87.8 10/01/2019   PLT 193 10/01/2019   Lab Results  Component Value Date   FERRITIN 610 (H) 09/02/2019   IRON 49 09/02/2019   TIBC 388 09/02/2019   UIBC 339 09/02/2019   IRONPCTSAT 13 (L) 09/02/2019   Lab Results  Component Value Date   RETICCTPCT 1.5 10/01/2019   RBC 3.89 (L) 10/01/2019   RBC 3.94 (L) 10/01/2019   RETICCTABS 52.1 11/22/2011   No results found for: [facility] Lab Results  Component Value Date   IGA 159 03/09/2012   Lab Results  Component Value Date   ALBUMINELP 4.2 07/27/2018   MSPIKE Not Observed 07/27/2018     Chemistry      Component Value Date/Time   NA 137 10/01/2019 0854   NA 136 03/16/2019 0938   NA 144 06/27/2017 0857   K 4.0 10/01/2019 0854   K 3.9 06/27/2017 0857   CL 100 10/01/2019 0854   CL 103 06/27/2017 0857   CO2 28 10/01/2019 0854   CO2 26 06/27/2017 0857   BUN 32 (H) 10/01/2019 0854   BUN 34 (H) 03/16/2019 0938   BUN 12 06/27/2017 0857   CREATININE 1.65 (H) 10/01/2019 0854   CREATININE 1.0 06/27/2017 0857      Component Value Date/Time   CALCIUM 9.9 10/01/2019 0854   CALCIUM 9.2 06/27/2017 0857    ALKPHOS 47 10/01/2019 0854   ALKPHOS 128 (H) 06/27/2017 0857   AST 14 (L) 10/01/2019 0854   ALT 12 10/01/2019 0854   ALT 24 06/27/2017 0857   BILITOT 0.5 10/01/2019 0854      Impression and Plan: Richard [name] is a very pleasant 79 yo caucasian gentleman with diffuse large B-cell lymphoma (not double hit lymphoma).   I we will have to get him back in 4 weeks.  We will have to see how his labs look at that point.  We may have to add ESA to the protocol so that we can try to get his blood count up a little bit better.  I know this is complicated.  He has a lot of things going on.  He has a lot of issues that can affect the hemoglobin.    [name], MD 4/2/202110:19 AM

## 2019-10-01 NOTE — Telephone Encounter (Signed)
Pt notified per order of Dr. Marin Olp that "the iron level is actually ok!!  The B12 is still low!!"  Pt appreciative of call back and has no questions or concerns at this time.

## 2019-10-01 NOTE — Telephone Encounter (Signed)
-----   Message from Richard Napoleon, MD sent at 10/01/2019  1:13 PM EDT ----- Call - the iron level is actually ok!!  The B12 is still low!!  Richard [name]

## 2019-10-14 DIAGNOSIS — M5136 Other intervertebral disc degeneration, lumbar region: Secondary | ICD-10-CM | POA: Diagnosis not present

## 2019-10-29 ENCOUNTER — Inpatient Hospital Stay (HOSPITAL_BASED_OUTPATIENT_CLINIC_OR_DEPARTMENT_OTHER): Payer: Medicare Other | Admitting: Hematology & Oncology

## 2019-10-29 ENCOUNTER — Other Ambulatory Visit: Payer: Self-pay

## 2019-10-29 ENCOUNTER — Inpatient Hospital Stay: Payer: Medicare Other

## 2019-10-29 ENCOUNTER — Telehealth: Payer: Self-pay

## 2019-10-29 ENCOUNTER — Encounter: Payer: Self-pay | Admitting: Hematology & Oncology

## 2019-10-29 VITALS — BP 119/54 | HR 67 | Temp 97.7°F | Resp 18 | Wt 251.0 lb

## 2019-10-29 DIAGNOSIS — D519 Vitamin B12 deficiency anemia, unspecified: Secondary | ICD-10-CM

## 2019-10-29 DIAGNOSIS — C8332 Diffuse large B-cell lymphoma, intrathoracic lymph nodes: Secondary | ICD-10-CM

## 2019-10-29 DIAGNOSIS — D518 Other vitamin B12 deficiency anemias: Secondary | ICD-10-CM

## 2019-10-29 DIAGNOSIS — D500 Iron deficiency anemia secondary to blood loss (chronic): Secondary | ICD-10-CM

## 2019-10-29 DIAGNOSIS — D510 Vitamin B12 deficiency anemia due to intrinsic factor deficiency: Secondary | ICD-10-CM | POA: Diagnosis not present

## 2019-10-29 DIAGNOSIS — C8330 Diffuse large B-cell lymphoma, unspecified site: Secondary | ICD-10-CM | POA: Diagnosis not present

## 2019-10-29 DIAGNOSIS — D508 Other iron deficiency anemias: Secondary | ICD-10-CM

## 2019-10-29 DIAGNOSIS — Z95828 Presence of other vascular implants and grafts: Secondary | ICD-10-CM

## 2019-10-29 DIAGNOSIS — D511 Vitamin B12 deficiency anemia due to selective vitamin B12 malabsorption with proteinuria: Secondary | ICD-10-CM

## 2019-10-29 DIAGNOSIS — Z9221 Personal history of antineoplastic chemotherapy: Secondary | ICD-10-CM | POA: Diagnosis not present

## 2019-10-29 LAB — CMP (CANCER CENTER ONLY)
ALT: 16 U/L (ref 0–44)
AST: 16 U/L (ref 15–41)
Albumin: 4.2 g/dL (ref 3.5–5.0)
Alkaline Phosphatase: 44 U/L (ref 38–126)
Anion gap: 9 (ref 5–15)
BUN: 40 mg/dL — ABNORMAL HIGH (ref 8–23)
CO2: 26 mmol/L (ref 22–32)
Calcium: 9.2 mg/dL (ref 8.9–10.3)
Chloride: 104 mmol/L (ref 98–111)
Creatinine: 1.84 mg/dL — ABNORMAL HIGH (ref 0.61–1.24)
GFR, Est AFR Am: 39 mL/min — ABNORMAL LOW (ref >60)
GFR, Estimated: 34 mL/min — ABNORMAL LOW (ref >60)
Glucose, Bld: 115 mg/dL — ABNORMAL HIGH (ref 70–99)
Potassium: 4.4 mmol/L (ref 3.5–5.1)
Sodium: 139 mmol/L (ref 135–145)
Total Bilirubin: 0.5 mg/dL (ref 0.3–1.2)
Total Protein: 6.5 g/dL (ref 6.5–8.1)

## 2019-10-29 LAB — CBC WITH DIFFERENTIAL (CANCER CENTER ONLY)
Abs Immature Granulocytes: 0.08 10*3/uL — ABNORMAL HIGH (ref 0.00–0.07)
Basophils Absolute: 0 10*3/uL (ref 0.0–0.1)
Basophils Relative: 0 %
Eosinophils Absolute: 0.1 10*3/uL (ref 0.0–0.5)
Eosinophils Relative: 1 %
HCT: 37.3 % — ABNORMAL LOW (ref 39.0–52.0)
Hemoglobin: 11.8 g/dL — ABNORMAL LOW (ref 13.0–17.0)
Immature Granulocytes: 1 %
Lymphocytes Relative: 18 %
Lymphs Abs: 1.9 10*3/uL (ref 0.7–4.0)
MCH: 27.8 pg (ref 26.0–34.0)
MCHC: 31.6 g/dL (ref 30.0–36.0)
MCV: 87.8 fL (ref 80.0–100.0)
Monocytes Absolute: 0.8 10*3/uL (ref 0.1–1.0)
Monocytes Relative: 8 %
Neutro Abs: 7.3 10*3/uL (ref 1.7–7.7)
Neutrophils Relative %: 72 %
Platelet Count: 158 10*3/uL (ref 150–400)
RBC: 4.25 MIL/uL (ref 4.22–5.81)
RDW: 18 % — ABNORMAL HIGH (ref 11.5–15.5)
WBC Count: 10.2 10*3/uL (ref 4.0–10.5)
nRBC: 0 % (ref 0.0–0.2)

## 2019-10-29 LAB — RETICULOCYTES
Immature Retic Fract: 19.1 % — ABNORMAL HIGH (ref 2.3–15.9)
RBC.: 4.23 MIL/uL (ref 4.22–5.81)
Retic Count, Absolute: 58.8 10*3/uL (ref 19.0–186.0)
Retic Ct Pct: 1.4 % (ref 0.4–3.1)

## 2019-10-29 LAB — VITAMIN B12: Vitamin B-12: 148 pg/mL — ABNORMAL LOW (ref 180–914)

## 2019-10-29 MED ORDER — CYANOCOBALAMIN 1000 MCG/ML IJ SOLN
INTRAMUSCULAR | Status: AC
  Filled 2019-10-29: qty 1

## 2019-10-29 MED ORDER — SODIUM CHLORIDE 0.9% FLUSH
10.0000 mL | INTRAVENOUS | Status: DC | PRN
  Administered 2019-10-29: 14:00:00 10 mL via INTRAVENOUS
  Filled 2019-10-29: qty 10

## 2019-10-29 MED ORDER — ALTEPLASE 2 MG IJ SOLR
2.0000 mg | Freq: Once | INTRAMUSCULAR | Status: DC | PRN
  Filled 2019-10-29: qty 2

## 2019-10-29 MED ORDER — CYANOCOBALAMIN 1000 MCG/ML IJ SOLN
1000.0000 ug | Freq: Once | INTRAMUSCULAR | Status: AC
  Administered 2019-10-29: 1000 ug via INTRAMUSCULAR

## 2019-10-29 MED ORDER — HEPARIN SOD (PORK) LOCK FLUSH 100 UNIT/ML IV SOLN
500.0000 [IU] | Freq: Once | INTRAVENOUS | Status: AC
  Administered 2019-10-29: 14:00:00 500 [IU] via INTRAVENOUS
  Filled 2019-10-29: qty 5

## 2019-10-29 NOTE — Patient Instructions (Signed)

## 2019-10-29 NOTE — Progress Notes (Signed)
Hematology and Oncology Follow Up Visit  Richard [name] [id number] May 24, 1941 79 y.o. 10/29/2019   Principle Diagnosis:  Diffuse large cell non-Hodgkin's lymphoma (IPI = 3) - NOT "double hit" Pernicious anemia Iron deficiency secondary to bleeding  Current Therapy:   R-CHOP - s/p cycle#8 - completed 08/2017 Vitamin B12 1 mg IM every month Xgeva 120 mg subcu q 3 months - next dose in 11/2019 IV iron with Feraheme.  Last dose given 03//2021   Interim History:  Richard [name] is here today for follow-up.  He is doing quite well right now.  He has had a good week.  He has been playing golf.  His left knee is doing quite well since he had his surgery.  He is very pleased with the results of the surgery.  Of note, his last vitamin B12 level back in early April was only 143.  Hopefully, his level will be better no recheck at this time.  He has been doing pretty well with iron.  Back in early April, his ferritin was 1125 but his iron saturation was only 20%.  He has had no obvious bleeding.  There is been no nausea or vomiting.  He has had no change in bowel or bladder habits.  Overall, his performance status is ECOG 1.    Medications:  Allergies as of 10/29/2019      Reactions   Benazepril Swelling   angioedema; he is not a candidate for any angiotensin receptor blockers because of this significant allergic reaction. Because of a history of documented adverse serious drug reaction;Medi Alert bracelet  is recommended   Hctz [hydrochlorothiazide] Anaphylaxis, Swelling   Tongue and lip swelling   Aspirin Other (See Comments)   Gastritis, cant take 325 Mg aspirin    Lactose Intolerance (gi) Nausea And Vomiting      Medication List       Accurate as of October 29, 2019  2:35 PM. If you have any questions, ask your nurse or doctor.        allopurinol 300 MG tablet Commonly known as: ZYLOPRIM Take 450 mg by mouth daily.   aluminum hydroxide-magnesium carbonate 95-358 MG/15ML  Susp Commonly known as: GAVISCON Take 15 mLs by mouth as needed for indigestion or heartburn.   amLODipine 5 MG tablet Commonly known as: NORVASC TAKE 1 TABLET BY MOUTH TWICE A DAY   atorvastatin 80 MG tablet Commonly known as: LIPITOR TAKE ONE TABLET BY MOUTH AT BEDTIME   azelastine 0.1 % nasal spray Commonly known as: ASTELIN Place 2 sprays into both nostrils at bedtime as needed for rhinitis or allergies.   Eliquis 2.5 MG Tabs tablet Generic drug: apixaban TAKE 1 TABLET BY MOUTH TWICE A DAY   esomeprazole 40 MG capsule Commonly known as: NexIUM Take 1 capsule (40 mg total) by mouth daily.   ezetimibe 10 MG tablet Commonly known as: ZETIA TAKE 1 TABLET BY MOUTH EVERY DAY   fenofibrate 160 MG tablet TAKE 1 TABLET BY MOUTH EVERY DAY   folic acid 1 MG tablet Commonly known as: FOLVITE TAKE 2 TABLETS BY MOUTH EVERY DAY   freestyle lancets USE TWICE A DAY TO CHECK BLOOD SUGAR. DX E11.9   FREESTYLE LITE test strip Generic drug: glucose blood Use to test blood sugar once a day.  Dx Code: E11.9   furosemide 40 MG tablet Commonly known as: LASIX TAKE 3 TABLETS BY MOUTH 2 (TWO) TIMES DAILY.   glimepiride 1 MG tablet Commonly known as: Amaryl Take 1 tablet (  1 mg total) by mouth daily with breakfast.   Klor-Con M20 20 MEQ tablet Generic drug: potassium chloride SA TAKE 2 TABLETS BY MOUTH 2 (TWO) TIMES DAILY.   metFORMIN 1000 MG tablet Commonly known as: GLUCOPHAGE TAKE 1 & 1/2 TABLETS BY MOUTH EVERY MORNING AND 1 TABLET IN THE EVENING.   metoprolol tartrate 25 MG tablet Commonly known as: LOPRESSOR TAKE ONE-HALF TABLET BY MOUTH TWICE DAILY   nitroGLYCERIN 0.4 MG SL tablet Commonly known as: NITROSTAT PLACE 1 TABLET (0.4 MG TOTAL) UNDER THE TONGUE EVERY 5 (FIVE) MINUTES AS NEEDED FOR CHEST PAIN.   polycarbophil 625 MG tablet Commonly known as: FIBERCON Take 625 mg by mouth daily.   pregabalin 150 MG capsule Commonly known as: Lyrica Take 1 capsule (150 mg  total) by mouth 2 (two) times daily.   psyllium 58.6 % powder Commonly known as: METAMUCIL Take 1 packet by mouth daily as needed (constipation).   Vascepa 1 g capsule Generic drug: icosapent Ethyl TAKE 2 CAPSULES BY MOUTH TWICE A DAY       Allergies:  Allergies  Allergen Reactions  . Benazepril Swelling    angioedema; he is not a candidate for any angiotensin receptor blockers because of this significant allergic reaction. Because of a history of documented adverse serious drug reaction;Medi Alert bracelet  is recommended  . Hctz [Hydrochlorothiazide] Anaphylaxis and Swelling    Tongue and lip swelling   . Aspirin Other (See Comments)    Gastritis, cant take 325 Mg aspirin   . Lactose Intolerance (Gi) Nausea And Vomiting    Past Medical History, Surgical history, Social history, and Family History were reviewed and updated.  Review of Systems: Review of Systems  Constitutional: Negative.   HENT: Positive for congestion.   Eyes: Negative.   Respiratory: Negative.   Cardiovascular: Negative.   Gastrointestinal: Negative.   Genitourinary: Negative.   Musculoskeletal: Negative.   Skin: Negative.   Neurological: Negative.   Endo/Heme/Allergies: Negative.      Physical Exam:  weight is 251 lb (113.9 kg). His temporal temperature is 97.7 F (36.5 C). His blood pressure is 119/54 (abnormal) and his pulse is 67. His respiration is 18 and oxygen saturation is 96%.   Wt Readings from Last 3 Encounters:  10/29/19 251 lb (113.9 kg)  10/01/19 261 lb (118.4 kg)  09/02/19 252 lb (114.3 kg)    Physical Exam Vitals reviewed.  HENT:     Head: Normocephalic and atraumatic.  Eyes:     Pupils: Pupils are equal, round, and reactive to light.  Cardiovascular:     Rate and Rhythm: Normal rate and regular rhythm.     Heart sounds: Normal heart sounds.  Pulmonary:     Effort: Pulmonary effort is normal.     Breath sounds: Normal breath sounds.  Abdominal:     General: Bowel  sounds are normal.     Palpations: Abdomen is soft.  Musculoskeletal:        General: No tenderness or deformity. Normal range of motion.     Cervical back: Normal range of motion.  Lymphadenopathy:     Cervical: No cervical adenopathy.  Skin:    General: Skin is warm and dry.     Findings: No erythema or rash.  Neurological:     Mental Status: He is alert and oriented to person, place, and time.  Psychiatric:        Behavior: Behavior normal.        Thought Content: Thought content normal.  Judgment: Judgment normal.     Lab Results  Component Value Date   WBC 10.2 10/29/2019   HGB 11.8 (L) 10/29/2019   HCT 37.3 (L) 10/29/2019   MCV 87.8 10/29/2019   PLT 158 10/29/2019   Lab Results  Component Value Date   FERRITIN 1,125 (H) 10/01/2019   IRON 71 10/01/2019   TIBC 346 10/01/2019   UIBC 275 10/01/2019   IRONPCTSAT 20 10/01/2019   Lab Results  Component Value Date   RETICCTPCT 1.4 10/29/2019   RBC 4.25 10/29/2019   RBC 4.23 10/29/2019   RETICCTABS 52.1 11/22/2011   No results found for: Nils [facility] Lab Results  Component Value Date   IGA 159 03/09/2012   Lab Results  Component Value Date   ALBUMINELP 4.2 07/27/2018   MSPIKE Not Observed 07/27/2018     Chemistry      Component Value Date/Time   NA 137 10/01/2019 0854   NA 136 03/16/2019 0938   NA 144 06/27/2017 0857   K 4.0 10/01/2019 0854   K 3.9 06/27/2017 0857   CL 100 10/01/2019 0854   CL 103 06/27/2017 0857   CO2 28 10/01/2019 0854   CO2 26 06/27/2017 0857   BUN 32 (H) 10/01/2019 0854   BUN 34 (H) 03/16/2019 0938   BUN 12 06/27/2017 0857   CREATININE 1.65 (H) 10/01/2019 0854   CREATININE 1.0 06/27/2017 0857      Component Value Date/Time   CALCIUM 9.9 10/01/2019 0854   CALCIUM 9.2 06/27/2017 0857   ALKPHOS 47 10/01/2019 0854   ALKPHOS 128 (H) 06/27/2017 0857   AST 14 (L) 10/01/2019 0854   ALT 12 10/01/2019 0854   ALT 24 06/27/2017 0857   BILITOT 0.5  10/01/2019 0854      Impression and Plan: Mr. Margraf is a very pleasant 79 yo caucasian gentleman with diffuse large B-cell lymphoma (not double hit lymphoma).  This was treated successfully with systemic chemotherapy.  He now has been in remission for 2 years.  He was found to have pernicious anemia.  He also has iron deficiency anemia.  He did get his vitamin B12 shot today.  We will have to see what the level of vitamin B12 is.  We will see what his iron levels are.  At this point, we do not have to worry about giving him ESA.  We will get him back in 4 weeks.  [name], MD 4/30/20212:35 PM

## 2019-10-29 NOTE — Telephone Encounter (Signed)
Left message for patient to remind of missed remote transmission.  

## 2019-11-01 ENCOUNTER — Telehealth: Payer: Self-pay | Admitting: Hematology & Oncology

## 2019-11-01 LAB — IRON AND TIBC
Iron: 51 ug/dL (ref 42–163)
Saturation Ratios: 15 % — ABNORMAL LOW (ref 20–55)
TIBC: 346 ug/dL (ref 202–409)
UIBC: 295 ug/dL (ref 117–376)

## 2019-11-01 LAB — FERRITIN: Ferritin: 965 ng/mL — ABNORMAL HIGH (ref 24–336)

## 2019-11-01 NOTE — Telephone Encounter (Signed)
I called and spoke with patient regarding appointment for iron being added.  He requested late appt on 5/10.  Appt scheduled

## 2019-11-04 LAB — CUP PACEART REMOTE DEVICE CHECK
Battery Remaining Longevity: 53 mo
Battery Voltage: 2.95 V
Brady Statistic AP VP Percent: 11 %
Brady Statistic AP VS Percent: 0 %
Brady Statistic AS VP Percent: 88.54 %
Brady Statistic AS VS Percent: 0.09 %
Brady Statistic RA Percent Paced: 2.82 %
Brady Statistic RV Percent Paced: 97.32 %
Date Time Interrogation Session: 20210506192542
Implantable Lead Implant Date: 20190313
Implantable Lead Implant Date: 20190313
Implantable Lead Location: 753859
Implantable Lead Location: 753860
Implantable Lead Model: 3830
Implantable Lead Model: 5076
Implantable Pulse Generator Implant Date: 20190313
Lead Channel Impedance Value: 323 Ohm
Lead Channel Impedance Value: 342 Ohm
Lead Channel Impedance Value: 399 Ohm
Lead Channel Impedance Value: 437 Ohm
Lead Channel Pacing Threshold Amplitude: 0.75 V
Lead Channel Pacing Threshold Amplitude: 1.125 V
Lead Channel Pacing Threshold Pulse Width: 0.4 ms
Lead Channel Pacing Threshold Pulse Width: 0.4 ms
Lead Channel Sensing Intrinsic Amplitude: 2.25 mV
Lead Channel Sensing Intrinsic Amplitude: 2.25 mV
Lead Channel Sensing Intrinsic Amplitude: 3.5 mV
Lead Channel Sensing Intrinsic Amplitude: 3.5 mV
Lead Channel Setting Pacing Amplitude: 1.5 V
Lead Channel Setting Pacing Amplitude: 3.5 V
Lead Channel Setting Pacing Pulse Width: 1 ms
Lead Channel Setting Sensing Sensitivity: 1.2 mV

## 2019-11-05 ENCOUNTER — Ambulatory Visit (INDEPENDENT_AMBULATORY_CARE_PROVIDER_SITE_OTHER): Payer: Medicare Other | Admitting: *Deleted

## 2019-11-05 DIAGNOSIS — I442 Atrioventricular block, complete: Secondary | ICD-10-CM

## 2019-11-05 NOTE — Progress Notes (Signed)
Remote pacemaker transmission.   

## 2019-11-08 ENCOUNTER — Other Ambulatory Visit: Payer: Self-pay

## 2019-11-08 ENCOUNTER — Inpatient Hospital Stay: Payer: Medicare Other | Attending: Hematology & Oncology

## 2019-11-08 VITALS — BP 114/56 | HR 65 | Temp 97.4°F | Resp 17

## 2019-11-08 DIAGNOSIS — D510 Vitamin B12 deficiency anemia due to intrinsic factor deficiency: Secondary | ICD-10-CM | POA: Diagnosis not present

## 2019-11-08 DIAGNOSIS — Z9221 Personal history of antineoplastic chemotherapy: Secondary | ICD-10-CM | POA: Diagnosis not present

## 2019-11-08 DIAGNOSIS — D500 Iron deficiency anemia secondary to blood loss (chronic): Secondary | ICD-10-CM | POA: Insufficient documentation

## 2019-11-08 DIAGNOSIS — M25561 Pain in right knee: Secondary | ICD-10-CM | POA: Diagnosis not present

## 2019-11-08 DIAGNOSIS — M545 Low back pain: Secondary | ICD-10-CM | POA: Diagnosis not present

## 2019-11-08 DIAGNOSIS — M25511 Pain in right shoulder: Secondary | ICD-10-CM | POA: Diagnosis not present

## 2019-11-08 DIAGNOSIS — M79641 Pain in right hand: Secondary | ICD-10-CM | POA: Diagnosis not present

## 2019-11-08 DIAGNOSIS — Z96652 Presence of left artificial knee joint: Secondary | ICD-10-CM | POA: Diagnosis not present

## 2019-11-08 DIAGNOSIS — D508 Other iron deficiency anemias: Secondary | ICD-10-CM

## 2019-11-08 DIAGNOSIS — Z471 Aftercare following joint replacement surgery: Secondary | ICD-10-CM | POA: Diagnosis not present

## 2019-11-08 DIAGNOSIS — M79642 Pain in left hand: Secondary | ICD-10-CM | POA: Diagnosis not present

## 2019-11-08 DIAGNOSIS — T07XXXA Unspecified multiple injuries, initial encounter: Secondary | ICD-10-CM | POA: Diagnosis not present

## 2019-11-08 DIAGNOSIS — C8330 Diffuse large B-cell lymphoma, unspecified site: Secondary | ICD-10-CM | POA: Insufficient documentation

## 2019-11-08 DIAGNOSIS — D519 Vitamin B12 deficiency anemia, unspecified: Secondary | ICD-10-CM

## 2019-11-08 DIAGNOSIS — Z96659 Presence of unspecified artificial knee joint: Secondary | ICD-10-CM | POA: Diagnosis not present

## 2019-11-08 MED ORDER — SODIUM CHLORIDE 0.9 % IV SOLN
510.0000 mg | Freq: Once | INTRAVENOUS | Status: AC
  Administered 2019-11-08: 510 mg via INTRAVENOUS
  Filled 2019-11-08: qty 510

## 2019-11-08 MED ORDER — SODIUM CHLORIDE 0.9% FLUSH
10.0000 mL | INTRAVENOUS | Status: DC | PRN
  Administered 2019-11-08: 10 mL
  Filled 2019-11-08: qty 10

## 2019-11-08 MED ORDER — HEPARIN SOD (PORK) LOCK FLUSH 100 UNIT/ML IV SOLN
500.0000 [IU] | Freq: Once | INTRAVENOUS | Status: AC | PRN
  Administered 2019-11-08: 500 [IU]
  Filled 2019-11-08: qty 5

## 2019-11-08 NOTE — Progress Notes (Signed)
Pt declined to stay for the 30 minute post infusion observation period. Pt stated he has tolerated this medication multiple times prior. Pt aware to call clinic with any questions or concerns. Pt verbalized understanding and had no further questions.

## 2019-11-08 NOTE — Patient Instructions (Signed)

## 2019-11-15 ENCOUNTER — Other Ambulatory Visit: Payer: Self-pay

## 2019-11-15 ENCOUNTER — Ambulatory Visit (INDEPENDENT_AMBULATORY_CARE_PROVIDER_SITE_OTHER): Payer: Medicare Other | Admitting: Family

## 2019-11-15 ENCOUNTER — Encounter: Payer: Self-pay | Admitting: Family

## 2019-11-15 ENCOUNTER — Ambulatory Visit (HOSPITAL_BASED_OUTPATIENT_CLINIC_OR_DEPARTMENT_OTHER)
Admission: RE | Admit: 2019-11-15 | Discharge: 2019-11-15 | Disposition: A | Discharge status: Home / Self Care | Payer: Medicare Other | Source: Ambulatory Visit | Attending: Family | Admitting: Family

## 2019-11-15 VITALS — BP 120/69 | HR 66 | Temp 97.2°F | Resp 16 | Ht 72.0 in | Wt 250.0 lb

## 2019-11-15 DIAGNOSIS — S299XXA Unspecified injury of thorax, initial encounter: Secondary | ICD-10-CM | POA: Diagnosis not present

## 2019-11-15 DIAGNOSIS — R0781 Pleurodynia: Secondary | ICD-10-CM | POA: Insufficient documentation

## 2019-11-15 DIAGNOSIS — T148XXA Other injury of unspecified body region, initial encounter: Secondary | ICD-10-CM | POA: Diagnosis not present

## 2019-11-15 MED ORDER — CEPHALEXIN 500 MG PO CAPS
500.0000 mg | ORAL_CAPSULE | Freq: Two times a day (BID) | ORAL | 0 refills | Status: DC
Start: 2019-11-15 — End: 2019-11-22

## 2019-11-15 NOTE — Progress Notes (Signed)
Subjective:    Patient ID: Richard [name], male    DOB: 1940-07-03, 79 y.o.   MRN: [id number]  HPI   Patient is an 79 yr old male who presents today to discuss fall. Fall occurred on 11/07/19 while at church. States that he reached to get the door and his hand slipped off, dove with his hands and right side falling down 5 cement steps.  There was no head injury and no LOC.    Had x-rays of right shoulder and knees by his orthopedist. Reports no fractures.  Has a large bruise on his back and right breast.  His orthopedist did give him a 4 day course of keflex which he has completed.  He lives alone, his sister lives in [place] and "takes good care" of him.  She calls him twice a day and has been helping with dressing changes.    Review of Systems    see HPI  Past Medical History:  Diagnosis Date  . Anemia   . Arthritis    hips  . Axillary adenopathy 02/25/2017  . Bradycardia    a. holter monitor has demonstrated HRs in 30s and [place]   . CAD (coronary artery disease)    a. s/p CABG 2001  b.  07/28/2017 cath:   Severe three-vessel native CAD with total occlusion of LAD, ramus intermedius, first OM and RCA, patent RIMA to PDA, LIMA to LAD, sequential SVG to ramus intermedius and first OM.    [name] Chronic lower back pain   . Diffuse large B cell lymphoma ([place])   . Diverticulosis   . Esophageal stricture   . GERD (gastroesophageal reflux disease)   . History of gout   . HTN (hypertension)   . Mixed hyperlipidemia   . OSA on CPAP    with 2L O2 at night  . Pancytopenia ([place])    a. related to chemo therapy for B cell lymphoma  . Peptic stricture of esophagus   . Presence of permanent cardiac pacemaker    sees Dr. Valaria Good pacemaker  . Severe aortic stenosis   . Spinal stenosis   . Type II diabetes mellitus ([place])   . Wears dentures    partial upper     Social History   Socioeconomic History  . Marital status: Divorced    Spouse name: Not on file  . Number of  children: 2  . Years of education: college  . Highest education level: Not on file  Occupational History    Employer: RETIRED  Tobacco Use  . Smoking status: Never Smoker  . Smokeless tobacco: Never Used  Substance and Sexual Activity  . Alcohol use: No    Alcohol/week: 0.0 standard drinks    Comment: "last drink was in 2012"( 03/15/2014)  . Drug use: No  . Sexual activity: Not Currently  Other Topics Concern  . Not on file  Social History Narrative   Divorced, lives with a roommate.   1 son one daughter   3-4 caffeinated beverages daily   Right-handed.   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   Social Determinants of Health   Financial Resource Strain:   . Difficulty of Paying Living Expenses:   Food Insecurity:   . Worried About Charity fundraiser in the Last Year:   . Arboriculturist in the Last Year:   Transportation Needs:   .  Lack of Transportation (Medical):   [name] Lack of Transportation (Non-Medical):   Physical Activity:   . Days of Exercise per Week:   . Minutes of Exercise per Session:   Stress:   . Feeling of Stress :   Social Connections:   . Frequency of Communication with Friends and Family:   . Frequency of Social Gatherings with Friends and Family:   . Attends Religious Services:   . Active Member of Clubs or Organizations:   . Attends Archivist Meetings:   [name] Marital Status:   Intimate Partner Violence:   . Fear of Current or Ex-Partner:   . Emotionally Abused:   [name] Physically Abused:   . Sexually Abused:     Past Surgical History:  Procedure Laterality Date  . APPENDECTOMY  ~ 1952  . BACK SURGERY    . CATARACT EXTRACTION W/PHACO Left 11/06/2016   Procedure: CATARACT EXTRACTION PHACO AND INTRAOCULAR LENS PLACEMENT (IOC);  Surgeon: Estill Cotta, MD;  Location: [facility];  Service: Ophthalmology;  Laterality:  Left;  Lot # [id number] H [place]: 01:09.4 AP%:25.2 CDE: 30.64  . CATARACT EXTRACTION W/PHACO Right 12/04/2016   Procedure: CATARACT EXTRACTION PHACO AND INTRAOCULAR LENS PLACEMENT (IOC);  Surgeon: Estill Cotta, MD;  Location: [facility];  Service: Ophthalmology;  Laterality: Right;  [place] 1:25.9 AP% 24.1 CDE 39.10 Fluid Pack lot # [id number]  . COLONOSCOPY W/ BIOPSIES AND POLYPECTOMY  2013  . CORONARY ANGIOPLASTY  1993  . CORONARY ANGIOPLASTY WITH STENT PLACEMENT  05/1997   "1"  . CORONARY ARTERY BYPASS GRAFT  03/2000   "CABG X5"  . ECTROPION REPAIR Right 09/01/2018   Procedure: REPAIR OF ECTROPION BILATERAL upper and lower;  Surgeon: Karle Starch, MD;  Location: [place];  Service: Ophthalmology;  Laterality: Right;  Diabetic - oral meds sleep apnea  . ESOPHAGEAL DILATION  X 3-4   Dr. Lyla Son; "last one was in the 1990's"  . ESOPHAGOGASTRODUODENOSCOPY     multiple  . FLEXIBLE SIGMOIDOSCOPY     multiple  . HYDRADENITIS EXCISION Left 02/25/2017   Procedure: EXCISION DEEP LEFT AXILLARY LYMPH NODE;  Surgeon: Fanny Skates, MD;  Location: [place];  Service: General;  Laterality: Left;  . INTRAOPERATIVE TRANSTHORACIC ECHOCARDIOGRAM N/A 09/09/2017   Procedure: INTRAOPERATIVE TRANSTHORACIC ECHOCARDIOGRAM;  Surgeon: Burnell Blanks, MD;  Location: [place];  Service: Open Heart Surgery;  Laterality: N/A;  . KNEE ARTHROSCOPY Left 2011   meniscus repair  . LEFT HEART CATHETERIZATION WITH CORONARY/GRAFT ANGIOGRAM N/A 03/16/2014   Procedure: LEFT HEART CATHETERIZATION WITH Beatrix Fetters;  Surgeon: Burnell Blanks, MD;  Location: [facility] CATH LAB;  Service: Cardiovascular;  Laterality: N/A;  . LUMBAR LAMINECTOMY/DECOMPRESSION MICRODISCECTOMY Right 06/17/2013   Procedure: LUMBAR LAMINECTOMY MICRODISCECTOMY L4-L5 RIGHT EXCISION OF SYNOVIAL CYST RIGHT   (1 LEVEL) RIGHT PARTIAL FACETECTOMY;  Surgeon: Tobi Bastos, MD;  Location: [facility];  Service: Orthopedics;  Laterality: Right;   . MYELOGRAM  04/06/13   lumbar, Dr Gladstone Lighter  . ORBITAL LESION EXCISION Right 09/01/2018   Procedure: ORBITOTOMY WITHOUT BONE FLAP WITH REMOVAL OF LESION RIGHT;  Surgeon: Karle Starch, MD;  Location: [place];  Service: Ophthalmology;  Laterality: Right;  . PACEMAKER IMPLANT N/A 09/10/2017   Procedure: PACEMAKER IMPLANT;  Surgeon: Deboraha Sprang, MD;  Location: [place] CV LAB;  Service: Cardiovascular;  Laterality: N/A;  . PANENDOSCOPY    . PORTACATH PLACEMENT N/A 03/06/2017   Procedure: INSERTION PORT-A-CATH AND ASPIRATE SEROMA LEFT AXILLA;  Surgeon: Fanny Skates, MD;  Location: [facility];  Service: General;  Laterality: N/A;  . RIGHT/LEFT HEART CATH AND CORONARY/GRAFT ANGIOGRAPHY N/A 07/28/2017   Procedure: RIGHT/LEFT HEART CATH AND CORONARY/GRAFT ANGIOGRAPHY;  Surgeon: Sherren Mocha, MD;  Location: [place] CV LAB;  Service: Cardiovascular;  Laterality: N/A;  . SHOULDER SURGERY Right 08/2010   screws placed; "tendons tore off"  . SKIN CANCER EXCISION Right    "neck"  . TONSILLECTOMY  ~ 1954  . TOTAL KNEE ARTHROPLASTY Left 06/29/2019   Procedure: TOTAL KNEE ARTHROPLASTY;  Surgeon: Paralee Cancel, MD;  Location: [facility];  Service: Orthopedics;  Laterality: Left;  70 mins  . TRANSCATHETER AORTIC VALVE REPLACEMENT, TRANSFEMORAL N/A 09/09/2017   Procedure: TRANSCATHETER AORTIC VALVE REPLACEMENT, TRANSFEMORAL;  Surgeon: Burnell Blanks, MD;  Location: [place];  Service: Open Heart Surgery;  Laterality: N/A;  . UPPER GI ENDOSCOPY  2013   Gastritis; Dr Carlean Purl  . VASECTOMY      Family History  Problem Relation Age of Onset  . Stroke Father   . Hypertension Father   . Pancreatic cancer Mother   . Diabetes Maternal Grandmother   . Stroke Maternal Grandmother   . Heart attack Paternal Grandmother   . Colon cancer Neg Hx   . Esophageal cancer Neg Hx   . Rectal cancer Neg Hx   . Stomach cancer Neg Hx   . Ulcers Neg Hx     Allergies  Allergen Reactions  . Benazepril  Swelling    angioedema; he is not a candidate for any angiotensin receptor blockers because of this significant allergic reaction. Because of a history of documented adverse serious drug reaction;Medi Alert bracelet  is recommended  . Hctz [Hydrochlorothiazide] Anaphylaxis and Swelling    Tongue and lip swelling   . Aspirin Other (See Comments)    Gastritis, cant take 325 Mg aspirin   . Lactose Intolerance (Gi) Nausea And Vomiting    Current Outpatient Medications on File Prior to Visit  Medication Sig Dispense Refill  . allopurinol (ZYLOPRIM) 300 MG tablet Take 450 mg by mouth daily.     [name] aluminum hydroxide-magnesium carbonate (GAVISCON) 95-358 MG/15ML SUSP Take 15 mLs by mouth as needed for indigestion or heartburn.    [name] amLODipine (NORVASC) 5 MG tablet TAKE 1 TABLET BY MOUTH TWICE A DAY 180 tablet 1  . atorvastatin (LIPITOR) 80 MG tablet TAKE ONE TABLET BY MOUTH AT BEDTIME 90 tablet 1  . azelastine (ASTELIN) 0.1 % nasal spray Place 2 sprays into both nostrils at bedtime as needed for rhinitis or allergies. 30 mL 3  . ELIQUIS 2.5 MG TABS tablet TAKE 1 TABLET BY MOUTH TWICE A DAY 180 tablet 1  . esomeprazole (NEXIUM) 40 MG capsule Take 1 capsule (40 mg total) by mouth daily. 30 capsule 5  . ezetimibe (ZETIA) 10 MG tablet TAKE 1 TABLET BY MOUTH EVERY DAY (Patient taking differently: Take 10 mg by mouth daily. ) 90 tablet 3  . fenofibrate 160 MG tablet TAKE 1 TABLET BY MOUTH EVERY DAY 90 tablet 1  . folic acid (FOLVITE) 1 MG tablet TAKE 2 TABLETS BY MOUTH EVERY DAY (Patient taking differently: Take 2 mg by mouth daily. ) 180 tablet 4  . furosemide (LASIX) 40 MG tablet TAKE 3 TABLETS BY MOUTH 2 (TWO) TIMES DAILY. 540 tablet 2  . glimepiride (AMARYL) 1 MG tablet Take 1 tablet (1 mg total) by mouth daily with breakfast. 90 tablet 1  . glucose blood (FREESTYLE LITE) test strip Use to test blood sugar once a  day.  Dx Code: E11.9 50 strip 1  . KLOR-CON M20 20 MEQ tablet TAKE 2 TABLETS BY MOUTH 2  (TWO) TIMES DAILY. 360 tablet 1  . Lancets (FREESTYLE) lancets USE TWICE A DAY TO CHECK BLOOD SUGAR. DX E11.9 100 each 6  . metFORMIN (GLUCOPHAGE) 1000 MG tablet TAKE 1 & 1/2 TABLETS BY MOUTH EVERY MORNING AND 1 TABLET IN THE EVENING. 225 tablet 1  . metoprolol tartrate (LOPRESSOR) 25 MG tablet TAKE ONE-HALF TABLET BY MOUTH TWICE DAILY (Patient taking differently: Take 12.5 mg by mouth 2 (two) times daily. ) 90 tablet 3  . nitroGLYCERIN (NITROSTAT) 0.4 MG SL tablet PLACE 1 TABLET (0.4 MG TOTAL) UNDER THE TONGUE EVERY 5 (FIVE) MINUTES AS NEEDED FOR CHEST PAIN. 25 tablet 3  . polycarbophil (FIBERCON) 625 MG tablet Take 625 mg by mouth daily.    . pregabalin (LYRICA) 150 MG capsule Take 1 capsule (150 mg total) by mouth 2 (two) times daily. 180 capsule 1  . psyllium (METAMUCIL) 58.6 % powder Take 1 packet by mouth daily as needed (constipation).     . VASCEPA 1 g capsule TAKE 2 CAPSULES BY MOUTH TWICE A DAY 120 capsule 5   Current Facility-Administered Medications on File Prior to Visit  Medication Dose Route Frequency Provider Last Rate Last Admin  . sodium chloride flush (NS) 0.9 % injection 10 mL  10 mL Intravenous PRN [name], MD   10 mL at 05/30/17 0920    BP 120/69 (BP Location: Right Arm, Patient Position: Sitting, Cuff Size: Large)   Pulse 66   Temp (!) 97.2 F (36.2 C) (Temporal)   Resp 16   Ht 6' (1.829 m)   Wt 250 lb (113.4 kg)   SpO2 98%   BMI 33.91 kg/m    Objective:   Physical Exam Constitutional:      General: He is not in acute distress.    Appearance: He is well-developed.  HENT:     Head: Normocephalic and atraumatic.  Cardiovascular:     Rate and Rhythm: Normal rate and regular rhythm.     Heart sounds: No murmur.  Pulmonary:     Effort: Pulmonary effort is normal. No respiratory distress.     Breath sounds: Normal breath sounds. No wheezing or rales.  Skin:    General: Skin is warm and dry.     Comments: Skin tears on both dorsal hands, right  forearm  Bruising of flank as noted in photo.  Also bruising on right breast.  Neurological:     Mental Status: He is alert and oriented to person, place, and time.  Psychiatric:        Behavior: Behavior normal.        Thought Content: Thought content normal.            Assessment & Plan:  Rib pain- x-ray is performed and is negative for fracture.  Skin tears- wounds were cleansed, applied antibiotic ointment and fresh dressings. Recommended rx with keflex to prevent infection. He is advised to call if increased redness, drainage, odor from wounds.   This visit occurred during the SARS-CoV-2 public health emergency.  Safety protocols were in place, including screening questions prior to the visit, additional usage of staff PPE, and extensive cleaning of exam room while observing appropriate contact time as indicated for disinfecting solutions.

## 2019-11-15 NOTE — Patient Instructions (Signed)
Continue to dress wounds every other day. Start keflex (antibiotic) to help prevent skin infection. Call if increased redness, drainage, bad odor from wounds or if wounds do not continue to improve in the coming weeks.

## 2019-11-19 NOTE — Progress Notes (Signed)
Subjective:   Richard [name] is a 79 y.o. male who presents for Medicare Annual/Subsequent preventive examination.  Plays golf 3x/ week. Goes to church every Sunday.  Review of Systems:  Home Safety/Smoke Alarms: Feels safe in home. Smoke alarms in place.  Lives alone in 2 story home. Does well w/ stairs. Sister checks on him 2x/day.   Male:     PSA-  Lab Results  Component Value Date   PSA 0.65 09/27/2013   PSA 0.68 03/21/2010   PSA 0.57 12/15/2008       Objective:    Vitals: BP 122/70 (BP Location: Left Arm, Patient Position: Sitting) Comment: all vitals done by Alinda Dooms, CMA  Pulse 81   Temp (!) 97.2 F (36.2 C) (Oral)   Ht 6' (1.829 m)   Wt 250 lb (113.4 kg)   SpO2 95%   BMI 33.91 kg/m   Body mass index is 33.91 kg/m.  Advanced Directives 11/22/2019 10/29/2019 10/29/2019 10/01/2019 06/22/2019 06/18/2019 04/27/2019  Does Patient Have a Medical Advance Directive? Yes Yes Yes Yes Yes Yes Yes  Type of Paramedic of [place];Living will [place];Living will [place];Living will [place];Living will [place];Living will [place];Living will [place];Living will  Does patient want to make changes to medical advance directive? No - Patient declined No - Patient declined - No - Patient declined No - Patient declined No - Patient declined -  Copy of [place] in Chart? Yes - validated most recent copy scanned in chart (See row information) - - - No - copy requested - No - copy requested  Would patient like information on creating a medical advance directive? - - - - - - No - Patient declined    Tobacco Social History   Tobacco Use  Smoking Status Never Smoker  Smokeless Tobacco Never Used     Counseling given: Not Answered   Clinical Intake:     Pain : No/denies pain                 Past  Medical History:  Diagnosis Date  . Anemia   . Arthritis    hips  . Axillary adenopathy 02/25/2017  . Bradycardia    a. holter monitor has demonstrated HRs in 30s and [place]   . CAD (coronary artery disease)    a. s/p CABG 2001  b.  07/28/2017 cath:   Severe three-vessel native CAD with total occlusion of LAD, ramus intermedius, first OM and RCA, patent RIMA to PDA, LIMA to LAD, sequential SVG to ramus intermedius and first OM.    [name] Chronic lower back pain   . Diffuse large B cell lymphoma ([place])   . Diverticulosis   . Esophageal stricture   . GERD (gastroesophageal reflux disease)   . History of gout   . HTN (hypertension)   . Mixed hyperlipidemia   . OSA on CPAP    with 2L O2 at night  . Pancytopenia ([place])    a. related to chemo therapy for B cell lymphoma  . Peptic stricture of esophagus   . Presence of permanent cardiac pacemaker    sees Dr. Valaria Good pacemaker  . Severe aortic stenosis   . Spinal stenosis   . Type II diabetes mellitus ([place])   . Wears dentures    partial upper   Past Surgical History:  Procedure Laterality Date  . APPENDECTOMY  ~  1952  . BACK SURGERY    . CATARACT EXTRACTION W/PHACO Left 11/06/2016   Procedure: CATARACT EXTRACTION PHACO AND INTRAOCULAR LENS PLACEMENT (IOC);  Surgeon: Estill Cotta, MD;  Location: [facility];  Service: Ophthalmology;  Laterality: Left;  Lot # [id number] H [place]: 01:09.4 AP%:25.2 CDE: 30.64  . CATARACT EXTRACTION W/PHACO Right 12/04/2016   Procedure: CATARACT EXTRACTION PHACO AND INTRAOCULAR LENS PLACEMENT (IOC);  Surgeon: Estill Cotta, MD;  Location: [facility];  Service: Ophthalmology;  Laterality: Right;  [place] 1:25.9 AP% 24.1 CDE 39.10 Fluid Pack lot # [id number]  . COLONOSCOPY W/ BIOPSIES AND POLYPECTOMY  2013  . CORONARY ANGIOPLASTY  1993  . CORONARY ANGIOPLASTY WITH STENT PLACEMENT  05/1997   "1"  . CORONARY ARTERY BYPASS GRAFT  03/2000   "CABG X5"  . ECTROPION REPAIR Right 09/01/2018   Procedure: REPAIR OF  ECTROPION BILATERAL upper and lower;  Surgeon: Karle Starch, MD;  Location: [place];  Service: Ophthalmology;  Laterality: Right;  Diabetic - oral meds sleep apnea  . ESOPHAGEAL DILATION  X 3-4   Dr. Lyla Son; "last one was in the 1990's"  . ESOPHAGOGASTRODUODENOSCOPY     multiple  . FLEXIBLE SIGMOIDOSCOPY     multiple  . HYDRADENITIS EXCISION Left 02/25/2017   Procedure: EXCISION DEEP LEFT AXILLARY LYMPH NODE;  Surgeon: Fanny Skates, MD;  Location: [place];  Service: General;  Laterality: Left;  . INTRAOPERATIVE TRANSTHORACIC ECHOCARDIOGRAM N/A 09/09/2017   Procedure: INTRAOPERATIVE TRANSTHORACIC ECHOCARDIOGRAM;  Surgeon: Burnell Blanks, MD;  Location: [place];  Service: Open Heart Surgery;  Laterality: N/A;  . KNEE ARTHROSCOPY Left 2011   meniscus repair  . LEFT HEART CATHETERIZATION WITH CORONARY/GRAFT ANGIOGRAM N/A 03/16/2014   Procedure: LEFT HEART CATHETERIZATION WITH Beatrix Fetters;  Surgeon: Burnell Blanks, MD;  Location: [facility] C[facility];  Service: Cardiovascular;  Laterality: N/A;  . LUMBAR LAMINECTOMY/DECOMPRESSION MICRODISCECTOMY Right 06/17/2013   Procedure: LUMBAR LAMINECTOMY MICRODISCECTOMY L4-L5 RIGHT EXCISION OF SYNOVIAL CYST RIGHT   (1 LEVEL) RIGHT PARTIAL FACETECTOMY;  Surgeon: Tobi Bastos, MD;  Location: [facility];  Service: Orthopedics;  Laterality: Right;  . MYELOGRAM  04/06/13   lumbar, Dr Gladstone Lighter  . ORBITAL LESION EXCISION Right 09/01/2018   Procedure: ORBITOTOMY WITHOUT BONE FLAP WITH REMOVAL OF LESION RIGHT;  Surgeon: Karle Starch, MD;  Location: [place];  Service: Ophthalmology;  Laterality: Right;  . PACEMAKER IMPLANT N/A 09/10/2017   Procedure: PACEMAKER IMPLANT;  Surgeon: Deboraha Sprang, MD;  Location: [place] CV LAB;  Service: Cardiovascular;  Laterality: N/A;  . PANENDOSCOPY    . PORTACATH PLACEMENT N/A 03/06/2017   Procedure: INSERTION PORT-A-CATH AND ASPIRATE SEROMA LEFT AXILLA;  Surgeon: Fanny Skates, MD;   Location: [place];  Service: General;  Laterality: N/A;  . RIGHT/LEFT HEART CATH AND CORONARY/GRAFT ANGIOGRAPHY N/A 07/28/2017   Procedure: RIGHT/LEFT HEART CATH AND CORONARY/GRAFT ANGIOGRAPHY;  Surgeon: Sherren Mocha, MD;  Location: [place] [facility] LAB;  Service: Cardiovascular;  Laterality: N/A;  . SHOULDER SURGERY Right 08/2010   screws placed; "tendons tore off"  . SKIN CANCER EXCISION Right    "neck"  . TONSILLECTOMY  ~ 1954  . TOTAL KNEE ARTHROPLASTY Left 06/29/2019   Procedure: TOTAL KNEE ARTHROPLASTY;  Surgeon: Paralee Cancel, MD;  Location: [facility];  Service: Orthopedics;  Laterality: Left;  70 mins  . TRANSCATHETER AORTIC VALVE REPLACEMENT, TRANSFEMORAL N/A 09/09/2017   Procedure: TRANSCATHETER AORTIC VALVE REPLACEMENT, TRANSFEMORAL;  Surgeon: Burnell Blanks, MD;  Location: [place];  Service: Open Heart Surgery;  Laterality: N/A;  . UPPER GI ENDOSCOPY  2013   Gastritis; Dr Carlean Purl  . VASECTOMY     Family History  Problem Relation Age of Onset  . Stroke Father   . Hypertension Father   . Pancreatic cancer Mother   . Diabetes Maternal Grandmother   . Stroke Maternal Grandmother   . Heart attack Paternal Grandmother   . Colon cancer Neg Hx   . Esophageal cancer Neg Hx   . Rectal cancer Neg Hx   . Stomach cancer Neg Hx   . Ulcers Neg Hx    Social History   Socioeconomic History  . Marital status: Divorced    Spouse name: Not on file  . Number of children: 2  . Years of education: college  . Highest education level: Not on file  Occupational History    Employer: RETIRED  Tobacco Use  . Smoking status: Never Smoker  . Smokeless tobacco: Never Used  Substance and Sexual Activity  . Alcohol use: No    Alcohol/week: 0.0 standard drinks    Comment: "last drink was in 2012"( 03/15/2014)  . Drug use: No  . Sexual activity: Not Currently  Other Topics Concern  . Not on file  Social History Narrative   Divorced, lives with a roommate.   1 son one daughter   3-4  caffeinated beverages daily   Right-handed.   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   Social Determinants of Health   Financial Resource Strain: Low Risk   . Difficulty of Paying Living Expenses: Not hard at all  Food Insecurity: No Food Insecurity  . Worried About Charity fundraiser in the Last Year: Never true  . Ran Out of Food in the Last Year: Never true  Transportation Needs: No Transportation Needs  . Lack of Transportation (Medical): No  . Lack of Transportation (Non-Medical): No  Physical Activity:   . Days of Exercise per Week:   . Minutes of Exercise per Session:   Stress:   . Feeling of Stress :   Social Connections:   . Frequency of Communication with Friends and Family:   . Frequency of Social Gatherings with Friends and Family:   . Attends Religious Services:   . Active Member of Clubs or Organizations:   . Attends Archivist Meetings:   [name] Marital Status:     Outpatient Encounter Medications as of 11/22/2019  Medication Sig  . allopurinol (ZYLOPRIM) 300 MG tablet Take 450 mg by mouth daily.   [name] aluminum hydroxide-magnesium carbonate (GAVISCON) 95-358 MG/15ML SUSP Take 15 mLs by mouth as needed for indigestion or heartburn.  [name] amLODipine (NORVASC) 5 MG tablet TAKE 1 TABLET BY MOUTH TWICE A DAY  . atorvastatin (LIPITOR) 80 MG tablet TAKE ONE TABLET BY MOUTH AT BEDTIME  . azelastine (ASTELIN) 0.1 % nasal spray Place 2 sprays into both nostrils at bedtime as needed for rhinitis or allergies.  [name] ELIQUIS 2.5 MG TABS tablet TAKE 1 TABLET BY MOUTH TWICE A DAY  . esomeprazole (NEXIUM) 40 MG capsule Take 1 capsule (40 mg total) by mouth daily.  [name] ezetimibe (ZETIA) 10 MG tablet TAKE 1 TABLET BY MOUTH EVERY DAY (Patient taking differently: Take 10 mg by mouth daily. )  . fenofibrate 160 MG tablet TAKE 1 TABLET BY MOUTH EVERY DAY  . folic acid  (FOLVITE) 1 MG tablet TAKE 2  TABLETS BY MOUTH EVERY DAY (Patient taking differently: Take 2 mg by mouth daily. )  . furosemide (LASIX) 40 MG tablet TAKE 3 TABLETS BY MOUTH 2 (TWO) TIMES DAILY.  [name] glimepiride (AMARYL) 1 MG tablet Take 1 tablet (1 mg total) by mouth daily with breakfast.  . glucose blood (FREESTYLE LITE) test strip Use to test blood sugar once a day.  Dx Code: E11.9  . KLOR-CON M20 20 MEQ tablet TAKE 2 TABLETS BY MOUTH 2 (TWO) TIMES DAILY.  [name] Lancets (FREESTYLE) lancets USE TWICE A DAY TO CHECK BLOOD SUGAR. DX E11.9  . metFORMIN (GLUCOPHAGE) 1000 MG tablet TAKE 1 & 1/2 TABLETS BY MOUTH EVERY MORNING AND 1 TABLET IN THE EVENING.  . metoprolol tartrate (LOPRESSOR) 25 MG tablet TAKE ONE-HALF TABLET BY MOUTH TWICE DAILY (Patient taking differently: Take 12.5 mg by mouth 2 (two) times daily. )  . polycarbophil (FIBERCON) 625 MG tablet Take 625 mg by mouth daily.  . pregabalin (LYRICA) 150 MG capsule Take 1 capsule (150 mg total) by mouth 2 (two) times daily.  . psyllium (METAMUCIL) 58.6 % powder Take 1 packet by mouth daily as needed (constipation).   . VASCEPA 1 g capsule TAKE 2 CAPSULES BY MOUTH TWICE A DAY  . nitroGLYCERIN (NITROSTAT) 0.4 MG SL tablet PLACE 1 TABLET (0.4 MG TOTAL) UNDER THE TONGUE EVERY 5 (FIVE) MINUTES AS NEEDED FOR CHEST PAIN. (Patient not taking: Reported on 11/22/2019)  . [DISCONTINUED] cephALEXin (KEFLEX) 500 MG capsule Take 1 capsule (500 mg total) by mouth 2 (two) times daily for 7 days. (Patient not taking: Reported on 11/22/2019)   Facility-Administered Encounter Medications as of 11/22/2019  Medication  . sodium chloride flush (NS) 0.9 % injection 10 mL    Activities of Daily Living In your present state of health, do you have any difficulty performing the following activities: 11/22/2019 06/22/2019  Hearing? N N  Vision? N N  Difficulty concentrating or making decisions? N N  Walking or climbing stairs? N Y  Dressing or bathing? N N  Doing errands,  shopping? N N  Preparing Food and eating ? N -  Using the Toilet? N -  In the past six months, have you accidently leaked urine? Y -  Do you have problems with loss of bowel control? N -  Managing your Medications? N -  Managing your Finances? N -  Housekeeping or managing your Housekeeping? N -  Some recent data might be hidden    Patient Care Team: Carollee Herter, Alferd Apa, DO as PCP - General (Family Medicine) Minus Breeding, MD as PCP - Cardiology (Cardiology) Dingeldein, Remo Lipps, MD (Ophthalmology) Rigoberto Noel, MD as Consulting Physician (Pulmonary Disease) [name], MD as Consulting Physician (Oncology) Minus Breeding, MD as Consulting Physician (Cardiology) Parrett, Fonnie Mu, NP as Nurse Practitioner (Pulmonary Disease) [facility] (Dermatology) Latanya Maudlin, MD as Consulting Physician (Orthopedic Surgery)   Assessment:   This is a routine wellness examination for Richard [name]. Physical assessment deferred to PCP.  Exercise Activities and Dietary recommendations Exercise limited by: None identified Diet (meal preparation, eat out, water intake, caffeinated beverages, dairy products, fruits and vegetables): well balanced   Goals    . Cut out extra servings    . Lose 20 lbs       Fall Risk Fall Risk  11/22/2019 11/15/2019 10/21/2018 12/30/2017 04/25/2016  Falls in the past year? 1 1 0 No No  Number falls in past yr: 0 0 - - -  Comment - - - - -  Injury with Fall? 1 1 - - -  Risk for fall due to : - Other (Comment) - - -  Follow up Education provided;Falls prevention discussed - - - -   Depression Screen PHQ 2/9 Scores 11/22/2019 10/21/2018 10/20/2018 12/30/2017  PHQ - 2 Score 0 0 0 0    Cognitive Function Ad8 score reviewed for issues:  Issues making decisions:no  Less interest in hobbies / activities:no  Repeats questions, stories (family complaining):no  Trouble using ordinary gadgets (microwave, computer, phone):no  Forgets the month or year:  no  Mismanaging finances: no  Remembering appts:no  Daily problems with thinking and/or memory:no Ad8 score is=0     MMSE - Mini Mental State Exam 03/15/2016  Orientation to time 5  Orientation to Place 5  Registration 3  Attention/ Calculation 5  Recall 2  Language- name 2 objects 2  Language- repeat 1  Language- follow 3 step command 3  Language- read & follow direction 1  Write a sentence 1  Copy design 1  Total score 29        Immunization History  Administered Date(s) Administered  . Influenza Split 06/12/2011, 05/31/2013  . Influenza Whole 05/01/2012  . Influenza, High Dose Seasonal PF 02/09/2015, 03/12/2017, 03/09/2018  . Influenza-Unspecified 02/11/2014, 02/29/2016, 02/25/2019  . Pneumococcal Conjugate-13 09/18/2015  . Pneumococcal Polysaccharide-23 06/20/2013  . Td 03/20/2010, 03/16/2013, 09/29/2017  . Zoster Recombinat (Shingrix) 11/21/2016, 09/25/2017    Screening Tests Health Maintenance  Topic Date Due  . COVID-19 Vaccine (1) Never done  . OPHTHALMOLOGY EXAM  05/22/2019  . INFLUENZA VACCINE  01/30/2020  . HEMOGLOBIN A1C  02/07/2020  . URINE MICROALBUMIN  08/09/2020  . FOOT EXAM  11/21/2020  . TETANUS/TDAP  09/30/2027  . PNA vac Low Risk Adult  Completed     Plan:    Please schedule your next medicare wellness visit with me in 1 yr.  Continue to eat heart healthy diet (full of fruits, vegetables, whole grains, lean protein, water--limit salt, fat, and sugar intake) and increase physical activity as tolerated.  Continue doing brain stimulating activities (puzzles, reading, adult coloring books, staying active) to keep memory sharp.     I have personally reviewed and noted the following in the patient's chart:   . Medical and social history . Use of alcohol, tobacco or illicit drugs  . Current medications and supplements . Functional ability and status . Nutritional status . Physical activity . Advanced directives . List of other  physicians . Hospitalizations, surgeries, and ER visits in previous 12 months . Vitals . Screenings to include cognitive, depression, and falls . Referrals and appointments  In addition, I have reviewed and discussed with patient certain preventive protocols, quality metrics, and best practice recommendations. A written personalized care plan for preventive services as well as general preventive health recommendations were provided to patient.     Naaman Plummer [place], [place]  11/22/2019

## 2019-11-22 ENCOUNTER — Ambulatory Visit (INDEPENDENT_AMBULATORY_CARE_PROVIDER_SITE_OTHER): Payer: Medicare Other | Admitting: *Deleted

## 2019-11-22 ENCOUNTER — Ambulatory Visit (INDEPENDENT_AMBULATORY_CARE_PROVIDER_SITE_OTHER): Payer: Medicare Other | Admitting: Family Medicine

## 2019-11-22 ENCOUNTER — Other Ambulatory Visit: Payer: Self-pay

## 2019-11-22 ENCOUNTER — Encounter: Payer: Self-pay | Admitting: Family Medicine

## 2019-11-22 ENCOUNTER — Encounter: Payer: Self-pay | Admitting: *Deleted

## 2019-11-22 VITALS — BP 122/70 | HR 81 | Temp 97.2°F | Ht 72.0 in | Wt 250.0 lb

## 2019-11-22 VITALS — BP 122/70 | HR 81 | Temp 97.2°F | Resp 18 | Ht 72.0 in | Wt 250.0 lb

## 2019-11-22 DIAGNOSIS — I5033 Acute on chronic diastolic (congestive) heart failure: Secondary | ICD-10-CM

## 2019-11-22 DIAGNOSIS — M1A09X0 Idiopathic chronic gout, multiple sites, without tophus (tophi): Secondary | ICD-10-CM | POA: Diagnosis not present

## 2019-11-22 DIAGNOSIS — E785 Hyperlipidemia, unspecified: Secondary | ICD-10-CM

## 2019-11-22 DIAGNOSIS — Z0000 Encounter for general adult medical examination without abnormal findings: Secondary | ICD-10-CM | POA: Diagnosis not present

## 2019-11-22 DIAGNOSIS — I10 Essential (primary) hypertension: Secondary | ICD-10-CM

## 2019-11-22 DIAGNOSIS — IMO0002 Reserved for concepts with insufficient information to code with codable children: Secondary | ICD-10-CM

## 2019-11-22 DIAGNOSIS — E1165 Type 2 diabetes mellitus with hyperglycemia: Secondary | ICD-10-CM

## 2019-11-22 DIAGNOSIS — R351 Nocturia: Secondary | ICD-10-CM

## 2019-11-22 DIAGNOSIS — E1151 Type 2 diabetes mellitus with diabetic peripheral angiopathy without gangrene: Secondary | ICD-10-CM | POA: Diagnosis not present

## 2019-11-22 DIAGNOSIS — E1169 Type 2 diabetes mellitus with other specified complication: Secondary | ICD-10-CM | POA: Diagnosis not present

## 2019-11-22 DIAGNOSIS — C8332 Diffuse large B-cell lymphoma, intrathoracic lymph nodes: Secondary | ICD-10-CM | POA: Diagnosis not present

## 2019-11-22 DIAGNOSIS — E1142 Type 2 diabetes mellitus with diabetic polyneuropathy: Secondary | ICD-10-CM | POA: Diagnosis not present

## 2019-11-22 LAB — COMPREHENSIVE METABOLIC PANEL
ALT: 16 U/L (ref 0–53)
AST: 19 U/L (ref 0–37)
Albumin: 4.8 g/dL (ref 3.5–5.2)
Alkaline Phosphatase: 66 U/L (ref 39–117)
BUN: 36 mg/dL — ABNORMAL HIGH (ref 6–23)
CO2: 30 mEq/L (ref 19–32)
Calcium: 10.2 mg/dL (ref 8.4–10.5)
Chloride: 98 mEq/L (ref 96–112)
Creatinine, Ser: 1.76 mg/dL — ABNORMAL HIGH (ref 0.40–1.50)
GFR: 37.32 mL/min — ABNORMAL LOW (ref >60.00)
Glucose, Bld: 147 mg/dL — ABNORMAL HIGH (ref 70–99)
Potassium: 4.5 mEq/L (ref 3.5–5.1)
Sodium: 140 mEq/L (ref 135–145)
Total Bilirubin: 0.6 mg/dL (ref 0.2–1.2)
Total Protein: 7 g/dL (ref 6.0–8.3)

## 2019-11-22 LAB — PSA: PSA: 1.11 ng/mL (ref 0.10–4.00)

## 2019-11-22 LAB — LIPID PANEL
Cholesterol: 167 mg/dL (ref 0–200)
HDL: 28.3 mg/dL — ABNORMAL LOW (ref >39.00)
Total CHOL/HDL Ratio: 6
Triglycerides: 765 mg/dL — ABNORMAL HIGH (ref 0.0–149.0)

## 2019-11-22 LAB — URIC ACID: Uric Acid, Serum: 6.2 mg/dL (ref 4.0–7.8)

## 2019-11-22 LAB — HEMOGLOBIN A1C: Hgb A1c MFr Bld: 7 % — ABNORMAL HIGH (ref 4.6–6.5)

## 2019-11-22 LAB — LDL CHOLESTEROL, DIRECT: Direct LDL: 48 mg/dL

## 2019-11-22 NOTE — Patient Instructions (Signed)
Carbohydrate Counting for Diabetes Mellitus, Adult  Carbohydrate counting is a method of keeping track of how many carbohydrates you eat. Eating carbohydrates naturally increases the amount of sugar (glucose) in the blood. Counting how many carbohydrates you eat helps keep your blood glucose within normal limits, which helps you manage your diabetes (diabetes mellitus). It is important to know how many carbohydrates you can safely have in each meal. This is different for every person. A diet and nutrition specialist (registered dietitian) can help you make a meal plan and calculate how many carbohydrates you should have at each meal and snack. Carbohydrates are found in the following foods:  Grains, such as breads and cereals.  Dried beans and soy products.  Starchy vegetables, such as potatoes, peas, and corn.  Fruit and fruit juices.  Milk and yogurt.  Sweets and snack foods, such as cake, cookies, candy, chips, and soft drinks. How do I count carbohydrates? There are two ways to count carbohydrates in food. You can use either of the methods or a combination of both. Reading "Nutrition Facts" on packaged food The "Nutrition Facts" list is included on the labels of almost all packaged foods and beverages in the [place] It includes:  The serving size.  Information about nutrients in each serving, including the grams (g) of carbohydrate per serving. To use the "Nutrition Facts":  Decide how many servings you will have.  Multiply the number of servings by the number of carbohydrates per serving.  The resulting number is the total amount of carbohydrates that you will be having. Learning standard serving sizes of other foods When you eat carbohydrate foods that are not packaged or do not include "Nutrition Facts" on the label, you need to measure the servings in order to count the amount of carbohydrates:  Measure the foods that you will eat with a food scale or measuring cup, if  needed.  Decide how many standard-size servings you will eat.  Multiply the number of servings by 15. Most carbohydrate-rich foods have about 15 g of carbohydrates per serving. ? For example, if you eat 8 oz (170 g) of strawberries, you will have eaten 2 servings and 30 g of carbohydrates (2 servings x 15 g = 30 g).  For foods that have more than one food mixed, such as soups and casseroles, you must count the carbohydrates in each food that is included. The following list contains standard serving sizes of common carbohydrate-rich foods. Each of these servings has about 15 g of carbohydrates:   hamburger bun or  English muffin.   oz (15 mL) syrup.   oz (14 g) jelly.  1 slice of bread.  1 six-inch tortilla.  3 oz (85 g) cooked rice or pasta.  4 oz (113 g) cooked dried beans.  4 oz (113 g) starchy vegetable, such as peas, corn, or potatoes.  4 oz (113 g) hot cereal.  4 oz (113 g) mashed potatoes or  of a large baked potato.  4 oz (113 g) canned or frozen fruit.  4 oz (120 mL) fruit juice.  4-6 crackers.  6 chicken nuggets.  6 oz (170 g) unsweetened dry cereal.  6 oz (170 g) plain fat-free yogurt or yogurt sweetened with artificial sweeteners.  8 oz (240 mL) milk.  8 oz (170 g) fresh fruit or one small piece of fruit.  24 oz (680 g) popped popcorn. Example of carbohydrate counting Sample meal  3 oz (85 g) chicken breast.  6 oz (170 g)   brown rice.  4 oz (113 g) corn.  8 oz (240 mL) milk.  8 oz (170 g) strawberries with sugar-free whipped topping. Carbohydrate calculation 1. Identify the foods that contain carbohydrates: ? Rice. ? Corn. ? Milk. ? Strawberries. 2. Calculate how many servings you have of each food: ? 2 servings rice. ? 1 serving corn. ? 1 serving milk. ? 1 serving strawberries. 3. Multiply each number of servings by 15 g: ? 2 servings rice x 15 g = 30 g. ? 1 serving corn x 15 g = 15 g. ? 1 serving milk x 15 g = 15 g. ? 1  serving strawberries x 15 g = 15 g. 4. Add together all of the amounts to find the total grams of carbohydrates eaten: ? 30 g + 15 g + 15 g + 15 g = 75 g of carbohydrates total. Summary  Carbohydrate counting is a method of keeping track of how many carbohydrates you eat.  Eating carbohydrates naturally increases the amount of sugar (glucose) in the blood.  Counting how many carbohydrates you eat helps keep your blood glucose within normal limits, which helps you manage your diabetes.  A diet and nutrition specialist (registered dietitian) can help you make a meal plan and calculate how many carbohydrates you should have at each meal and snack. This information is not intended to replace advice given to you by your health care provider. Make sure you discuss any questions you have with your health care provider. Document Revised: 01/09/2017 Document Reviewed: 11/29/2015 Elsevier Patient Education  2020 [employer]  

## 2019-11-22 NOTE — Assessment & Plan Note (Signed)
Per cardiology 

## 2019-11-22 NOTE — Assessment & Plan Note (Signed)
Per oncology °

## 2019-11-22 NOTE — Patient Instructions (Signed)
Please schedule your next medicare wellness visit with me in 1 yr.  Continue to eat heart healthy diet (full of fruits, vegetables, whole grains, lean protein, water--limit salt, fat, and sugar intake) and increase physical activity as tolerated.  Continue doing brain stimulating activities (puzzles, reading, adult coloring books, staying active) to keep memory sharp.    [name] , Thank you for taking time to come for your Medicare Wellness Visit. I appreciate your ongoing commitment to your health goals. Please review the following plan we discussed and let me know if I can assist you in the future.   These are the goals we discussed: Goals    . Cut out extra servings    . Lose 20 lbs       This is a list of the screening recommended for you and due dates:  Health Maintenance  Topic Date Due  . COVID-19 Vaccine (1) Never done  . Eye exam for diabetics  05/22/2019  . Flu Shot  01/30/2020  . Hemoglobin A1C  02/07/2020  . Urine Protein Check  08/09/2020  . Complete foot exam   11/21/2020  . Tetanus Vaccine  09/30/2027  . Pneumonia vaccines  Completed    Preventive Care 49 Years and Older, Male Preventive care refers to lifestyle choices and visits with your health care provider that can promote health and wellness. This includes:  A yearly physical exam. This is also called an annual well check.  Regular dental and eye exams.  Immunizations.  Screening for certain conditions.  Healthy lifestyle choices, such as diet and exercise. What can I expect for my preventive care visit? Physical exam Your health care provider will check:  Height and weight. These may be used to calculate body mass index (BMI), which is a measurement that tells if you are at a healthy weight.  Heart rate and blood pressure.  Your skin for abnormal spots. Counseling Your health care provider may ask you questions about:  Alcohol, tobacco, and drug use.  Emotional well-being.  Home and  relationship well-being.  Sexual activity.  Eating habits.  History of falls.  Memory and ability to understand (cognition).  Work and work Statistician. What immunizations do I need?  Influenza (flu) vaccine  This is recommended every year. Tetanus, diphtheria, and pertussis (Tdap) vaccine  You may need a Td booster every 10 years. Varicella (chickenpox) vaccine  You may need this vaccine if you have not already been vaccinated. Zoster (shingles) vaccine  You may need this after age 47. Pneumococcal conjugate (PCV13) vaccine  One dose is recommended after age 41. Pneumococcal polysaccharide (PPSV23) vaccine  One dose is recommended after age 69. Measles, mumps, and rubella (MMR) vaccine  You may need at least one dose of MMR if you were born in 1957 or later. You may also need a second dose. Meningococcal conjugate (MenACWY) vaccine  You may need this if you have certain conditions. Hepatitis A vaccine  You may need this if you have certain conditions or if you travel or work in places where you may be exposed to hepatitis A. Hepatitis B vaccine  You may need this if you have certain conditions or if you travel or work in places where you may be exposed to hepatitis B. Haemophilus influenzae type b (Hib) vaccine  You may need this if you have certain conditions. You may receive vaccines as individual doses or as more than one vaccine together in one shot (combination vaccines). Talk with your health care  provider about the risks and benefits of combination vaccines. What tests do I need? Blood tests  Lipid and cholesterol levels. These may be checked every 5 years, or more frequently depending on your overall health.  Hepatitis C test.  Hepatitis B test. Screening  Lung cancer screening. You may have this screening every year starting at age 65 if you have a 30-pack-year history of smoking and currently smoke or have quit within the past 15  years.  Colorectal cancer screening. All adults should have this screening starting at age 9 and continuing until age 33. Your health care provider may recommend screening at age 52 if you are at increased risk. You will have tests every 1-10 years, depending on your results and the type of screening test.  Prostate cancer screening. Recommendations will vary depending on your family history and other risks.  Diabetes screening. This is done by checking your blood sugar (glucose) after you have not eaten for a while (fasting). You may have this done every 1-3 years.  Abdominal aortic aneurysm (AAA) screening. You may need this if you are a current or former smoker.  Sexually transmitted disease (STD) testing. Follow these instructions at home: Eating and drinking  Eat a diet that includes fresh fruits and vegetables, whole grains, lean protein, and low-fat dairy products. Limit your intake of foods with high amounts of sugar, saturated fats, and salt.  Take vitamin and mineral supplements as recommended by your health care provider.  Do not drink alcohol if your health care provider tells you not to drink.  If you drink alcohol: ? Limit how much you have to 0-2 drinks a day. ? Be aware of how much alcohol is in your drink. In the [place] one drink equals one 12 oz bottle of beer (355 mL), one 5 oz glass of wine (148 mL), or one 1 oz glass of hard liquor (44 mL). Lifestyle  Take daily care of your teeth and gums.  Stay active. Exercise for at least 30 minutes on 5 or more days each week.  Do not use any products that contain nicotine or tobacco, such as cigarettes, e-cigarettes, and chewing tobacco. If you need help quitting, ask your health care provider.  If you are sexually active, practice safe sex. Use a condom or other form of protection to prevent STIs (sexually transmitted infections).  Talk with your health care provider about taking a low-dose aspirin or statin. What's  next?  Visit your health care provider once a year for a well check visit.  Ask your health care provider how often you should have your eyes and teeth checked.  Stay up to date on all vaccines. This information is not intended to replace advice given to you by your health care provider. Make sure you discuss any questions you have with your health care provider. Document Revised: 06/11/2018 Document Reviewed: 06/11/2018 Elsevier Patient Education  2020 [employer].

## 2019-11-22 NOTE — Assessment & Plan Note (Signed)
Well controlled, no changes to meds. Encouraged heart healthy diet such as the DASH diet and exercise as tolerated.  °

## 2019-11-22 NOTE — Assessment & Plan Note (Signed)
Encouraged heart healthy diet, increase exercise, avoid trans fats, consider a krill oil cap daily 

## 2019-11-22 NOTE — Assessment & Plan Note (Signed)
hgba1c to be checked, minimize simple carbs. Increase exercise as tolerated. Continue current meds  

## 2019-11-22 NOTE — Progress Notes (Signed)
Patient ID: Richard [name], male    DOB: April 05, 1941  Age: 79 y.o. MRN: [id number]    Subjective:  Subjective  HPI Richard [name] presents for f/u dm, chol and htn    He c/o urinary frequency esp at night as well  No other complaint He had a fall a few weeks ago.  He did not get dizzy or light headed--- he just mis stepped and fell and scratched up his hands but otherwise is doing well.  Review of Systems  Constitutional: Negative.   HENT: Negative for congestion, ear pain, hearing loss, nosebleeds, postnasal drip, rhinorrhea, sinus pressure, sneezing and tinnitus.   Eyes: Negative for photophobia, discharge, itching and visual disturbance.  Respiratory: Negative.   Cardiovascular: Negative.   Gastrointestinal: Negative for abdominal distention, abdominal pain, anal bleeding, blood in stool and constipation.  Endocrine: Negative.   Genitourinary: Negative.   Musculoskeletal: Negative.   Skin: Negative.   Allergic/Immunologic: Negative.   Neurological: Positive for numbness. Negative for dizziness, weakness, light-headedness and headaches.  Psychiatric/Behavioral: Negative for agitation, confusion, decreased concentration, dysphoric mood, sleep disturbance and suicidal ideas. The patient is not nervous/anxious.     History Past Medical History:  Diagnosis Date  . Anemia   . Arthritis    hips  . Axillary adenopathy 02/25/2017  . Bradycardia    a. holter monitor has demonstrated HRs in 30s and [place]   . CAD (coronary artery disease)    a. s/p CABG 2001  b.  07/28/2017 cath:   Severe three-vessel native CAD with total occlusion of LAD, ramus intermedius, first OM and RCA, patent RIMA to PDA, LIMA to LAD, sequential SVG to ramus intermedius and first OM.    [name] Chronic lower back pain   . Diffuse large B cell lymphoma ([place])   . Diverticulosis   . Esophageal stricture   . GERD (gastroesophageal reflux disease)   . History of gout   . HTN (hypertension)   . Mixed hyperlipidemia     . OSA on CPAP    with 2L O2 at night  . Pancytopenia ([place])    a. related to chemo therapy for B cell lymphoma  . Peptic stricture of esophagus   . Presence of permanent cardiac pacemaker    sees Dr. Valaria Good pacemaker  . Severe aortic stenosis   . Spinal stenosis   . Type II diabetes mellitus ([place])   . Wears dentures    partial upper    He has a past surgical history that includes Vasectomy; Coronary artery bypass graft (03/2000); Esophageal dilation (X 3-4); Esophagogastroduodenoscopy; Flexible sigmoidoscopy; Panendoscopy; Knee arthroscopy (Left, 2011); Shoulder surgery (Right, 08/2010); Colonoscopy w/ biopsies and polypectomy (2013); Upper gi endoscopy (2013); Myelogram (04/06/13); Lumbar laminectomy/decompression microdiscectomy (Right, 06/17/2013); Back surgery; Appendectomy (~ 1952); Tonsillectomy (~ 1954); Coronary angioplasty (1993); Coronary angioplasty with stent (05/1997); Skin cancer excision (Right); left heart catheterization with coronary/graft angiogram (N/A, 03/16/2014); Cataract extraction w/PHACO (Left, 11/06/2016); Cataract extraction w/PHACO (Right, 12/04/2016); Hydradenitis excision (Left, 02/25/2017); Portacath placement (N/A, 03/06/2017); RIGHT/LEFT HEART CATH AND CORONARY/GRAFT ANGIOGRAPHY (N/A, 07/28/2017); Transcatheter aortic valve replacement, transfemoral (N/A, 09/09/2017); Intraoperative transthoracic echocardiogram (N/A, 09/09/2017); PACEMAKER IMPLANT (N/A, 09/10/2017); Orbital lesion excision (Right, 09/01/2018); Ectropion repair (Right, 09/01/2018); and Total knee arthroplasty (Left, 06/29/2019).   His family history includes Diabetes in his maternal grandmother; Heart attack in his paternal grandmother; Hypertension in his father; Pancreatic cancer in his mother; Stroke in his father and maternal grandmother.He reports that he has never smoked. He has never used smokeless tobacco.  He reports that he does not drink alcohol or use drugs.  Current Outpatient Medications on File  Prior to Visit  Medication Sig Dispense Refill  . allopurinol (ZYLOPRIM) 300 MG tablet Take 450 mg by mouth daily.     [name] aluminum hydroxide-magnesium carbonate (GAVISCON) 95-358 MG/15ML SUSP Take 15 mLs by mouth as needed for indigestion or heartburn.    [name] amLODipine (NORVASC) 5 MG tablet TAKE 1 TABLET BY MOUTH TWICE A DAY 180 tablet 1  . atorvastatin (LIPITOR) 80 MG tablet TAKE ONE TABLET BY MOUTH AT BEDTIME 90 tablet 1  . azelastine (ASTELIN) 0.1 % nasal spray Place 2 sprays into both nostrils at bedtime as needed for rhinitis or allergies. 30 mL 3  . ELIQUIS 2.5 MG TABS tablet TAKE 1 TABLET BY MOUTH TWICE A DAY 180 tablet 1  . esomeprazole (NEXIUM) 40 MG capsule Take 1 capsule (40 mg total) by mouth daily. 30 capsule 5  . ezetimibe (ZETIA) 10 MG tablet TAKE 1 TABLET BY MOUTH EVERY DAY (Patient taking differently: Take 10 mg by mouth daily. ) 90 tablet 3  . fenofibrate 160 MG tablet TAKE 1 TABLET BY MOUTH EVERY DAY 90 tablet 1  . folic acid (FOLVITE) 1 MG tablet TAKE 2 TABLETS BY MOUTH EVERY DAY (Patient taking differently: Take 2 mg by mouth daily. ) 180 tablet 4  . furosemide (LASIX) 40 MG tablet TAKE 3 TABLETS BY MOUTH 2 (TWO) TIMES DAILY. 540 tablet 2  . glimepiride (AMARYL) 1 MG tablet Take 1 tablet (1 mg total) by mouth daily with breakfast. 90 tablet 1  . glucose blood (FREESTYLE LITE) test strip Use to test blood sugar once a day.  Dx Code: E11.9 50 strip 1  . KLOR-CON M20 20 MEQ tablet TAKE 2 TABLETS BY MOUTH 2 (TWO) TIMES DAILY. 360 tablet 1  . Lancets (FREESTYLE) lancets USE TWICE A DAY TO CHECK BLOOD SUGAR. DX E11.9 100 each 6  . metFORMIN (GLUCOPHAGE) 1000 MG tablet TAKE 1 & 1/2 TABLETS BY MOUTH EVERY MORNING AND 1 TABLET IN THE EVENING. 225 tablet 1  . metoprolol tartrate (LOPRESSOR) 25 MG tablet TAKE ONE-HALF TABLET BY MOUTH TWICE DAILY (Patient taking differently: Take 12.5 mg by mouth 2 (two) times daily. ) 90 tablet 3  . nitroGLYCERIN (NITROSTAT) 0.4 MG SL tablet PLACE 1 TABLET  (0.4 MG TOTAL) UNDER THE TONGUE EVERY 5 (FIVE) MINUTES AS NEEDED FOR CHEST PAIN. (Patient not taking: Reported on 11/22/2019) 25 tablet 3  . polycarbophil (FIBERCON) 625 MG tablet Take 625 mg by mouth daily.    . pregabalin (LYRICA) 150 MG capsule Take 1 capsule (150 mg total) by mouth 2 (two) times daily. 180 capsule 1  . psyllium (METAMUCIL) 58.6 % powder Take 1 packet by mouth daily as needed (constipation).     . VASCEPA 1 g capsule TAKE 2 CAPSULES BY MOUTH TWICE A DAY 120 capsule 5   Current Facility-Administered Medications on File Prior to Visit  Medication Dose Route Frequency Provider Last Rate Last Admin  . sodium chloride flush (NS) 0.9 % injection 10 mL  10 mL Intravenous PRN [name], MD   10 mL at 05/30/17 0920     Objective:  Objective  Physical Exam Vitals and nursing note reviewed.  Constitutional:      General: He is sleeping.     Appearance: He is well-developed.  HENT:     Head: Normocephalic and atraumatic.  Eyes:     Pupils: Pupils are equal, round, and  reactive to light.  Neck:     Thyroid: No thyromegaly.  Cardiovascular:     Rate and Rhythm: Normal rate and regular rhythm.     Heart sounds: Murmur present.  Pulmonary:     Effort: Pulmonary effort is normal. No respiratory distress.     Breath sounds: Normal breath sounds. No wheezing or rales.  Chest:     Chest wall: No tenderness.  Musculoskeletal:        General: No tenderness.     Cervical back: Normal range of motion and neck supple.  Skin:    General: Skin is warm and dry.  Neurological:     Mental Status: He is oriented to person, place, and time.  Psychiatric:        Behavior: Behavior normal.        Thought Content: Thought content normal.        Judgment: Judgment normal.    Diabetic Foot Exam - Simple   Simple Foot Form Diabetic Foot exam was performed with the following findings: Yes 11/22/2019 10:24 AM  Visual Inspection No deformities, no ulcerations, no other skin breakdown  bilaterally: Yes Sensation Testing See comments: Yes Pulse Check Posterior Tibialis and Dorsalis pulse intact bilaterally: Yes Comments Pt able to feel monofilament L foot except big toe Unable to feel monofilament R foot -- pt has seen neuro in the past for this and does not wish to go back Will refer to podiatry to help him with his toenails etc      BP 122/70 (BP Location: Left Arm, Patient Position: Sitting, Cuff Size: Large)   Pulse 81   Temp (!) 97.2 F (36.2 C) (Temporal)   Resp 18   Ht 6' (1.829 m)   Wt 250 lb (113.4 kg)   SpO2 95%   BMI 33.91 kg/m  Wt Readings from Last 3 Encounters:  11/22/19 250 lb (113.4 kg)  11/15/19 250 lb (113.4 kg)  10/29/19 251 lb (113.9 kg)     Lab Results  Component Value Date   WBC 10.2 10/29/2019   HGB 11.8 (L) 10/29/2019   HCT 37.3 (L) 10/29/2019   PLT 158 10/29/2019   GLUCOSE 115 (H) 10/29/2019   CHOL 174 08/10/2019   TRIG (H) 08/10/2019    439.0 Triglyceride is over 400; calculations on Lipids are invalid.   HDL 29.10 (L) 08/10/2019   LDLDIRECT 93.0 08/10/2019   LDLCALC 57 11/21/2016   ALT 16 10/29/2019   AST 16 10/29/2019   NA 139 10/29/2019   K 4.4 10/29/2019   CL 104 10/29/2019   CREATININE 1.84 (H) 10/29/2019   BUN 40 (H) 10/29/2019   CO2 26 10/29/2019   TSH 2.450 07/27/2018   PSA 0.65 09/27/2013   INR 1.15 11/15/2017   HGBA1C 5.6 08/10/2019   MICROALBUR 0.7 08/10/2019    DG Ribs Unilateral W/Chest Right  Result Date: 11/15/2019 CLINICAL DATA:  Pain following fall EXAM: RIGHT RIBS AND CHEST - 3+ VIEW COMPARISON:  Chest radiograph December 01, 2017 FINDINGS: Lungs are clear. Heart size and pulmonary vascularity are normal. No adenopathy. Port-A-Cath tip is in the superior vena cava. Pacemaker leads attached to right atrium, stable. There is aortic atherosclerosis. Status post aortic valve replacement. Surgical clips in or overlying the lateral left breast. No pneumothorax or pleural effusion.  No appreciable rib  fracture. IMPRESSION: Postoperative changes. No edema or airspace opacity. No evident rib fracture. No pneumothorax. Electronically Signed   By: Lowella Grip III M.D.   On: 11/15/2019  12:33     Assessment & Plan:  Plan  I have discontinued Richard [name]'s cephALEXin. I am also having him maintain his allopurinol, esomeprazole, polycarbophil, psyllium, nitroGLYCERIN, azelastine, folic acid, metoprolol tartrate, ezetimibe, FREESTYLE LITE, aluminum hydroxide-magnesium carbonate, amLODipine, freestyle, glimepiride, pregabalin, metFORMIN, Vascepa, Eliquis, atorvastatin, fenofibrate, furosemide, and Klor-Con M20.  No orders of the defined types were placed in this encounter.   Problem List Items Addressed This Visit      Unprioritized   Acute on chronic diastolic CHF (congestive heart failure), NYHA class 2 ([place])    Per cardiology      Diabetic peripheral neuropathy associated with type 2 diabetes mellitus ([place])    Stable lyrica makes it tolerable       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 ([place])    Per oncology      DM (diabetes mellitus) type II uncontrolled, periph vascular disorder ([place])    hgba1c to be checked, minimize simple carbs. Increase exercise as tolerated. Continue current meds      Essential hypertension    Well controlled, no changes to meds. Encouraged heart healthy diet such as the DASH diet and exercise as tolerated.       Relevant Orders   Comprehensive metabolic panel   Hyperlipidemia associated with type 2 diabetes mellitus ([place])    Encouraged heart healthy diet, increase exercise, avoid trans fats, consider a krill oil cap daily      Relevant Orders   Lipid panel   Comprehensive metabolic panel   Uncontrolled type 2 diabetes mellitus with hyperglycemia ([facility])   Relevant Orders   Hemoglobin A1c   Comprehensive metabolic panel   Ambulatory referral to Podiatry    Other Visit Diagnoses    Nocturia    -  Primary   Relevant  Orders   PSA   Idiopathic chronic gout of multiple sites without tophus       Relevant Orders   Uric acid      Follow-up: Return in about 6 months (around 05/24/2020) for hypertension, hyperlipidemia, diabetes II.  [name], DO

## 2019-11-22 NOTE — Assessment & Plan Note (Signed)
Stable lyrica makes it tolerable

## 2019-11-23 ENCOUNTER — Inpatient Hospital Stay (HOSPITAL_BASED_OUTPATIENT_CLINIC_OR_DEPARTMENT_OTHER): Payer: Medicare Other | Admitting: Hematology & Oncology

## 2019-11-23 ENCOUNTER — Inpatient Hospital Stay: Payer: Medicare Other

## 2019-11-23 ENCOUNTER — Telehealth: Payer: Self-pay | Admitting: Hematology & Oncology

## 2019-11-23 ENCOUNTER — Encounter: Payer: Self-pay | Admitting: Hematology & Oncology

## 2019-11-23 VITALS — BP 119/63 | HR 85 | Temp 97.7°F | Resp 19 | Wt 253.0 lb

## 2019-11-23 VITALS — BP 138/92 | HR 85 | Temp 97.7°F | Resp 17 | Wt 253.0 lb

## 2019-11-23 DIAGNOSIS — C8332 Diffuse large B-cell lymphoma, intrathoracic lymph nodes: Secondary | ICD-10-CM

## 2019-11-23 DIAGNOSIS — Z95828 Presence of other vascular implants and grafts: Secondary | ICD-10-CM

## 2019-11-23 DIAGNOSIS — C8330 Diffuse large B-cell lymphoma, unspecified site: Secondary | ICD-10-CM | POA: Diagnosis not present

## 2019-11-23 DIAGNOSIS — D508 Other iron deficiency anemias: Secondary | ICD-10-CM

## 2019-11-23 DIAGNOSIS — D510 Vitamin B12 deficiency anemia due to intrinsic factor deficiency: Secondary | ICD-10-CM | POA: Diagnosis not present

## 2019-11-23 DIAGNOSIS — M545 Low back pain: Secondary | ICD-10-CM | POA: Diagnosis not present

## 2019-11-23 DIAGNOSIS — D500 Iron deficiency anemia secondary to blood loss (chronic): Secondary | ICD-10-CM | POA: Diagnosis not present

## 2019-11-23 DIAGNOSIS — M25562 Pain in left knee: Secondary | ICD-10-CM | POA: Diagnosis not present

## 2019-11-23 DIAGNOSIS — D519 Vitamin B12 deficiency anemia, unspecified: Secondary | ICD-10-CM

## 2019-11-23 DIAGNOSIS — M79641 Pain in right hand: Secondary | ICD-10-CM | POA: Diagnosis not present

## 2019-11-23 DIAGNOSIS — Z9221 Personal history of antineoplastic chemotherapy: Secondary | ICD-10-CM | POA: Diagnosis not present

## 2019-11-23 DIAGNOSIS — D518 Other vitamin B12 deficiency anemias: Secondary | ICD-10-CM

## 2019-11-23 DIAGNOSIS — M79642 Pain in left hand: Secondary | ICD-10-CM | POA: Diagnosis not present

## 2019-11-23 LAB — CMP (CANCER CENTER ONLY)
ALT: 15 U/L (ref 0–44)
AST: 18 U/L (ref 15–41)
Albumin: 4.7 g/dL (ref 3.5–5.0)
Alkaline Phosphatase: 63 U/L (ref 38–126)
Anion gap: 11 (ref 5–15)
BUN: 42 mg/dL — ABNORMAL HIGH (ref 8–23)
CO2: 28 mmol/L (ref 22–32)
Calcium: 10.1 mg/dL (ref 8.9–10.3)
Chloride: 99 mmol/L (ref 98–111)
Creatinine: 1.76 mg/dL — ABNORMAL HIGH (ref 0.61–1.24)
GFR, Est AFR Am: 41 mL/min — ABNORMAL LOW (ref >60)
GFR, Estimated: 35 mL/min — ABNORMAL LOW (ref >60)
Glucose, Bld: 152 mg/dL — ABNORMAL HIGH (ref 70–99)
Potassium: 4.1 mmol/L (ref 3.5–5.1)
Sodium: 138 mmol/L (ref 135–145)
Total Bilirubin: 0.5 mg/dL (ref 0.3–1.2)
Total Protein: 7.1 g/dL (ref 6.5–8.1)

## 2019-11-23 LAB — CBC WITH DIFFERENTIAL (CANCER CENTER ONLY)
Abs Immature Granulocytes: 0.07 10*3/uL (ref 0.00–0.07)
Basophils Absolute: 0.1 10*3/uL (ref 0.0–0.1)
Basophils Relative: 1 %
Eosinophils Absolute: 0.1 10*3/uL (ref 0.0–0.5)
Eosinophils Relative: 1 %
HCT: 37.1 % — ABNORMAL LOW (ref 39.0–52.0)
Hemoglobin: 12 g/dL — ABNORMAL LOW (ref 13.0–17.0)
Immature Granulocytes: 1 %
Lymphocytes Relative: 20 %
Lymphs Abs: 1.6 10*3/uL (ref 0.7–4.0)
MCH: 28.8 pg (ref 26.0–34.0)
MCHC: 32.3 g/dL (ref 30.0–36.0)
MCV: 89.2 fL (ref 80.0–100.0)
Monocytes Absolute: 0.7 10*3/uL (ref 0.1–1.0)
Monocytes Relative: 9 %
Neutro Abs: 5.4 10*3/uL (ref 1.7–7.7)
Neutrophils Relative %: 68 %
Platelet Count: 200 10*3/uL (ref 150–400)
RBC: 4.16 MIL/uL — ABNORMAL LOW (ref 4.22–5.81)
RDW: 17.7 % — ABNORMAL HIGH (ref 11.5–15.5)
WBC Count: 8 10*3/uL (ref 4.0–10.5)
nRBC: 0 % (ref 0.0–0.2)

## 2019-11-23 LAB — VITAMIN B12: Vitamin B-12: 140 pg/mL — ABNORMAL LOW (ref 180–914)

## 2019-11-23 MED ORDER — DENOSUMAB 120 MG/1.7ML ~~LOC~~ SOLN
SUBCUTANEOUS | Status: AC
  Filled 2019-11-23: qty 1.7

## 2019-11-23 MED ORDER — DENOSUMAB 120 MG/1.7ML ~~LOC~~ SOLN
120.0000 mg | Freq: Once | SUBCUTANEOUS | Status: AC
  Administered 2019-11-23: 120 mg via SUBCUTANEOUS

## 2019-11-23 MED ORDER — CYANOCOBALAMIN 1000 MCG/ML IJ SOLN
INTRAMUSCULAR | Status: AC
  Filled 2019-11-23: qty 1

## 2019-11-23 MED ORDER — HEPARIN SOD (PORK) LOCK FLUSH 100 UNIT/ML IV SOLN
500.0000 [IU] | Freq: Once | INTRAVENOUS | Status: AC
  Administered 2019-11-23: 500 [IU] via INTRAVENOUS
  Filled 2019-11-23: qty 5

## 2019-11-23 MED ORDER — SODIUM CHLORIDE 0.9% FLUSH
10.0000 mL | INTRAVENOUS | Status: DC | PRN
  Administered 2019-11-23: 10 mL via INTRAVENOUS
  Filled 2019-11-23: qty 10

## 2019-11-23 MED ORDER — CYANOCOBALAMIN 1000 MCG/ML IJ SOLN
1000.0000 ug | Freq: Once | INTRAMUSCULAR | Status: DC
  Administered 2019-11-23: 1000 ug via INTRAMUSCULAR

## 2019-11-23 NOTE — Progress Notes (Signed)
Hematology and Oncology Follow Up Visit  Richard [name] [id number] [birth year] 79 y.o. 11/23/2019   Principle Diagnosis:  Diffuse large cell non-Hodgkin's lymphoma (IPI = 3) - NOT "double hit" Pernicious anemia Iron deficiency secondary to bleeding  Current Therapy:   R-CHOP - s/p cycle#8 - completed 08/2017 Vitamin B12 1 mg IM every month Xgeva 120 mg subcu q 3 months - next dose in 03/2020 IV iron with Feraheme.  Last dose given 11/03/2019   Interim History:  Richard [name] is here today for follow-up.  Overall, he seems to be holding his own.  Unfortunate couple weeks ago, he was at church and fell.  He landed on his arms.  He scraped the skin off the dorsum of his hands.  Being on his Eliquis, he did have some bleeding.  He was seen by his orthopedic surgeon.  His orthopedist did x-rays and thankfully there is no broken bones.  I am surprised that his vitamin B12 level still is not going up.  We will recheck back in late April, that the vitamin B12 is 07/01/1948.  I am a little surprised by this.  There has been no issues with his heart.  He is doing quite well since he had the valve replaced.  He has had no fever.  He has been playing golf 3 times a week.  He has had no cough.  Is had no nausea or vomiting.    Overall, his performance status is ECOG 1.    Medications:  Allergies as of 11/23/2019      Reactions   Benazepril Swelling   angioedema; he is not a candidate for any angiotensin receptor blockers because of this significant allergic reaction. Because of a history of documented adverse serious drug reaction;Medi Alert bracelet  is recommended   Hctz [hydrochlorothiazide] Anaphylaxis, Swelling   Tongue and lip swelling   Aspirin Other (See Comments)   Gastritis, cant take 325 Mg aspirin    Lactose Intolerance (gi) Nausea And Vomiting      Medication List       Accurate as of Nov 23, 2019  1:49 PM. If you have any questions, ask your nurse or doctor.          allopurinol 300 MG tablet Commonly known as: ZYLOPRIM Take 450 mg by mouth daily.   aluminum hydroxide-magnesium carbonate 95-358 MG/15ML Susp Commonly known as: GAVISCON Take 15 mLs by mouth as needed for indigestion or heartburn.   amLODipine 5 MG tablet Commonly known as: NORVASC TAKE 1 TABLET BY MOUTH TWICE A DAY   atorvastatin 80 MG tablet Commonly known as: LIPITOR TAKE ONE TABLET BY MOUTH AT BEDTIME   azelastine 0.1 % nasal spray Commonly known as: ASTELIN Place 2 sprays into both nostrils at bedtime as needed for rhinitis or allergies.   Eliquis 2.5 MG Tabs tablet Generic drug: apixaban TAKE 1 TABLET BY MOUTH TWICE A DAY   esomeprazole 40 MG capsule Commonly known as: NexIUM Take 1 capsule (40 mg total) by mouth daily.   ezetimibe 10 MG tablet Commonly known as: ZETIA TAKE 1 TABLET BY MOUTH EVERY DAY   fenofibrate 160 MG tablet TAKE 1 TABLET BY MOUTH EVERY DAY   folic acid 1 MG tablet Commonly known as: FOLVITE TAKE 2 TABLETS BY MOUTH EVERY DAY   freestyle lancets USE TWICE A DAY TO CHECK BLOOD SUGAR. DX E11.9   FREESTYLE LITE test strip Generic drug: glucose blood Use to test blood sugar once a day.  Dx Code:  E11.9   furosemide 40 MG tablet Commonly known as: LASIX TAKE 3 TABLETS BY MOUTH 2 (TWO) TIMES DAILY.   glimepiride 1 MG tablet Commonly known as: Amaryl Take 1 tablet (1 mg total) by mouth daily with breakfast.   Klor-Con M20 20 MEQ tablet Generic drug: potassium chloride SA TAKE 2 TABLETS BY MOUTH 2 (TWO) TIMES DAILY.   metFORMIN 1000 MG tablet Commonly known as: GLUCOPHAGE TAKE 1 & 1/2 TABLETS BY MOUTH EVERY MORNING AND 1 TABLET IN THE EVENING.   metoprolol tartrate 25 MG tablet Commonly known as: LOPRESSOR TAKE ONE-HALF TABLET BY MOUTH TWICE DAILY   nitroGLYCERIN 0.4 MG SL tablet Commonly known as: NITROSTAT PLACE 1 TABLET (0.4 MG TOTAL) UNDER THE TONGUE EVERY 5 (FIVE) MINUTES AS NEEDED FOR CHEST PAIN.   polycarbophil 625 MG  tablet Commonly known as: FIBERCON Take 625 mg by mouth daily.   pregabalin 150 MG capsule Commonly known as: Lyrica Take 1 capsule (150 mg total) by mouth 2 (two) times daily.   psyllium 58.6 % powder Commonly known as: METAMUCIL Take 1 packet by mouth daily as needed (constipation).   Vascepa 1 g capsule Generic drug: icosapent Ethyl TAKE 2 CAPSULES BY MOUTH TWICE A DAY       Allergies:  Allergies  Allergen Reactions  . Benazepril Swelling    angioedema; he is not a candidate for any angiotensin receptor blockers because of this significant allergic reaction. Because of a history of documented adverse serious drug reaction;Medi Alert bracelet  is recommended  . Hctz [Hydrochlorothiazide] Anaphylaxis and Swelling    Tongue and lip swelling   . Aspirin Other (See Comments)    Gastritis, cant take 325 Mg aspirin   . Lactose Intolerance (Gi) Nausea And Vomiting    Past Medical History, Surgical history, Social history, and Family History were reviewed and updated.  Review of Systems: Review of Systems  Constitutional: Negative.   HENT: Positive for congestion.   Eyes: Negative.   Respiratory: Negative.   Cardiovascular: Negative.   Gastrointestinal: Negative.   Genitourinary: Negative.   Musculoskeletal: Negative.   Skin: Negative.   Neurological: Negative.   Endo/Heme/Allergies: Negative.      Physical Exam:  weight is 253 lb (114.8 kg). His temporal temperature is 97.7 F (36.5 C). His blood pressure is 138/92 (abnormal) and his pulse is 85. His respiration is 17 and oxygen saturation is 100%.   Wt Readings from Last 3 Encounters:  11/23/19 253 lb (114.8 kg)  11/23/19 253 lb (114.8 kg)  11/22/19 250 lb (113.4 kg)    Physical Exam Vitals reviewed.  HENT:     Head: Normocephalic and atraumatic.  Eyes:     Pupils: Pupils are equal, round, and reactive to light.  Cardiovascular:     Rate and Rhythm: Normal rate and regular rhythm.     Heart sounds:  Normal heart sounds.  Pulmonary:     Effort: Pulmonary effort is normal.     Breath sounds: Normal breath sounds.  Abdominal:     General: Bowel sounds are normal.     Palpations: Abdomen is soft.  Musculoskeletal:        General: No tenderness or deformity. Normal range of motion.     Cervical back: Normal range of motion.  Lymphadenopathy:     Cervical: No cervical adenopathy.  Skin:    General: Skin is warm and dry.     Findings: No erythema or rash.  Neurological:     Mental Status: He is  alert and oriented to person, place, and time.  Psychiatric:        Behavior: Behavior normal.        Thought Content: Thought content normal.        Judgment: Judgment normal.     Lab Results  Component Value Date   WBC 8.0 11/23/2019   HGB 12.0 (L) 11/23/2019   HCT 37.1 (L) 11/23/2019   MCV 89.2 11/23/2019   PLT 200 11/23/2019   Lab Results  Component Value Date   FERRITIN 965 (H) 10/29/2019   IRON 51 10/29/2019   TIBC 346 10/29/2019   UIBC 295 10/29/2019   IRONPCTSAT 15 (L) 10/29/2019   Lab Results  Component Value Date   RETICCTPCT 1.4 10/29/2019   RBC 4.16 (L) 11/23/2019   RETICCTABS 52.1 11/22/2011   No results found for: Nils Pyle [facility] Lab Results  Component Value Date   IGA 159 03/09/2012   Lab Results  Component Value Date   ALBUMINELP 4.2 07/27/2018   MSPIKE Not Observed 07/27/2018     Chemistry      Component Value Date/Time   NA 138 11/23/2019 1223   NA 136 03/16/2019 0938   NA 144 06/27/2017 0857   K 4.1 11/23/2019 1223   K 3.9 06/27/2017 0857   CL 99 11/23/2019 1223   CL 103 06/27/2017 0857   CO2 28 11/23/2019 1223   CO2 26 06/27/2017 0857   BUN 42 (H) 11/23/2019 1223   BUN 34 (H) 03/16/2019 0938   BUN 12 06/27/2017 0857   CREATININE 1.76 (H) 11/23/2019 1223   CREATININE 1.0 06/27/2017 0857      Component Value Date/Time   CALCIUM 10.1 11/23/2019 1223   CALCIUM 9.2 06/27/2017 0857   ALKPHOS 63 11/23/2019 1223    ALKPHOS 128 (H) 06/27/2017 0857   AST 18 11/23/2019 1223   ALT 15 11/23/2019 1223   ALT 24 06/27/2017 0857   BILITOT 0.5 11/23/2019 1223      Impression and Plan: Richard [name] is a very pleasant 79 yo caucasian gentleman with diffuse large B-cell lymphoma (not double hit lymphoma).  This was treated successfully with systemic chemotherapy.  He now has been in remission for 2 years.  He was found to have pernicious anemia.  He also has iron deficiency anemia.  He did get his vitamin B12 shot today.  We will have to see what the level of vitamin B12 is.  We will give him his Delton See today.  We will see what his iron levels are.   [name], MD 5/25/20211:49 PM

## 2019-11-23 NOTE — Telephone Encounter (Signed)
Appointments scheduled calendar printed per 5/25 los

## 2019-11-24 ENCOUNTER — Other Ambulatory Visit: Payer: Self-pay

## 2019-11-24 ENCOUNTER — Other Ambulatory Visit: Payer: Self-pay | Admitting: Family Medicine

## 2019-11-24 ENCOUNTER — Telehealth: Payer: Self-pay | Admitting: *Deleted

## 2019-11-24 DIAGNOSIS — E781 Pure hyperglyceridemia: Secondary | ICD-10-CM

## 2019-11-24 DIAGNOSIS — N183 Chronic kidney disease, stage 3 unspecified: Secondary | ICD-10-CM

## 2019-11-24 DIAGNOSIS — N289 Disorder of kidney and ureter, unspecified: Secondary | ICD-10-CM

## 2019-11-24 LAB — FERRITIN: Ferritin: 1501 ng/mL — ABNORMAL HIGH (ref 24–336)

## 2019-11-24 LAB — IRON AND TIBC
Iron: 78 ug/dL (ref 42–163)
Saturation Ratios: 21 % (ref 20–55)
TIBC: 365 ug/dL (ref 202–409)
UIBC: 287 ug/dL (ref 117–376)

## 2019-11-24 NOTE — Telephone Encounter (Signed)
As noted below by Dr. Marin Olp, I informed the patient of the iron level. Instructed him to call if he had any questions or concerns.

## 2019-11-24 NOTE — Telephone Encounter (Signed)
-----   Message from [name], MD sent at 11/24/2019 10:51 AM EDT ----- Call - the iron level is ok!!  Richard [name]

## 2019-11-25 DIAGNOSIS — Z7189 Other specified counseling: Secondary | ICD-10-CM | POA: Insufficient documentation

## 2019-11-25 NOTE — Progress Notes (Signed)
Cardiology Office Note   Date:  11/26/2019   ID:  DRESHON PROFFIT, DOB 01-22-1941, MRN [id number]  PCP:  Carollee Herter, Alferd Apa, DO  Cardiologist:   Minus Breeding, MD  Chief Complaint  Patient presents with  . Hyperlipidemia      History of Present Illness: Richard [name] is a 79 y.o. male who presents for follow up of CAD and TAVR.   He has had atrial flutter and heart block as well necessitating a pacemaker.   He has an ascending aortic aneurysm.  He has had continued problems with triglycerides.  He has some renal insufficiency.  Overall he feels well.  He is golfing. The patient denies any new symptoms such as chest discomfort, neck or arm discomfort. There has been no new shortness of breath, PND or orthopnea. There have been no reported palpitations, presyncope or syncope.  He did fall the other day and scraped himself up but he said this was just an accidental trip.  Past Medical History:  Diagnosis Date  . Anemia   . Arthritis    hips  . Axillary adenopathy 02/25/2017  . Bradycardia    a. holter monitor has demonstrated HRs in 30s and [place]   . CAD (coronary artery disease)    a. s/p CABG 2001  b.  07/28/2017 cath:   Severe three-vessel native CAD with total occlusion of LAD, ramus intermedius, first OM and RCA, patent RIMA to PDA, LIMA to LAD, sequential SVG to ramus intermedius and first OM.    [name] Chronic lower back pain   . Diffuse large B cell lymphoma ([place])   . Diverticulosis   . Esophageal stricture   . GERD (gastroesophageal reflux disease)   . History of gout   . HTN (hypertension)   . Mixed hyperlipidemia   . OSA on CPAP    with 2L O2 at night  . Pancytopenia ([place])    a. related to chemo therapy for B cell lymphoma  . Peptic stricture of esophagus   . Presence of permanent cardiac pacemaker    sees Dr. Valaria Good pacemaker  . Severe aortic stenosis   . Spinal stenosis   . Type II diabetes mellitus ([place])   . Wears dentures    partial upper     Past Surgical History:  Procedure Laterality Date  . APPENDECTOMY  ~ 1952  . BACK SURGERY    . CATARACT EXTRACTION W/PHACO Left 11/06/2016   Procedure: CATARACT EXTRACTION PHACO AND INTRAOCULAR LENS PLACEMENT (IOC);  Surgeon: Estill Cotta, MD;  Location: [facility];  Service: Ophthalmology;  Laterality: Left;  Lot # [id number] H [place]: 01:09.4 AP%:25.2 CDE: 30.64  . CATARACT EXTRACTION W/PHACO Right 12/04/2016   Procedure: CATARACT EXTRACTION PHACO AND INTRAOCULAR LENS PLACEMENT (IOC);  Surgeon: Estill Cotta, MD;  Location: [facility];  Service: Ophthalmology;  Laterality: Right;  [place] 1:25.9 AP% 24.1 CDE 39.10 Fluid Pack lot # [id number]  . COLONOSCOPY W/ BIOPSIES AND POLYPECTOMY  2013  . CORONARY ANGIOPLASTY  1993  . CORONARY ANGIOPLASTY WITH STENT PLACEMENT  05/1997   "1"  . CORONARY ARTERY BYPASS GRAFT  03/2000   "CABG X5"  . ECTROPION REPAIR Right 09/01/2018   Procedure: REPAIR OF ECTROPION BILATERAL upper and lower;  Surgeon: Karle Starch, MD;  Location: [place];  Service: Ophthalmology;  Laterality: Right;  Diabetic - oral meds sleep apnea  . ESOPHAGEAL DILATION  X 3-4   Dr. Lyla Son; "last one was in the 1990's"  . ESOPHAGOGASTRODUODENOSCOPY  multiple  . FLEXIBLE SIGMOIDOSCOPY     multiple  . HYDRADENITIS EXCISION Left 02/25/2017   Procedure: EXCISION DEEP LEFT AXILLARY LYMPH NODE;  Surgeon: Fanny Skates, MD;  Location: [place];  Service: General;  Laterality: Left;  . INTRAOPERATIVE TRANSTHORACIC ECHOCARDIOGRAM N/A 09/09/2017   Procedure: INTRAOPERATIVE TRANSTHORACIC ECHOCARDIOGRAM;  Surgeon: Burnell Blanks, MD;  Location: [place];  Service: Open Heart Surgery;  Laterality: N/A;  . KNEE ARTHROSCOPY Left 2011   meniscus repair  . LEFT HEART CATHETERIZATION WITH CORONARY/GRAFT ANGIOGRAM N/A 03/16/2014   Procedure: LEFT HEART CATHETERIZATION WITH Beatrix Fetters;  Surgeon: Burnell Blanks, MD;  Location: [facility];  Service:  Cardiovascular;  Laterality: N/A;  . LUMBAR LAMINECTOMY/DECOMPRESSION MICRODISCECTOMY Right 06/17/2013   Procedure: LUMBAR LAMINECTOMY MICRODISCECTOMY L4-L5 RIGHT EXCISION OF SYNOVIAL CYST RIGHT   (1 LEVEL) RIGHT PARTIAL FACETECTOMY;  Surgeon: Tobi Bastos, MD;  Location: [facility];  Service: Orthopedics;  Laterality: Right;  . MYELOGRAM  04/06/13   lumbar, Dr Gladstone Lighter  . ORBITAL LESION EXCISION Right 09/01/2018   Procedure: ORBITOTOMY WITHOUT BONE FLAP WITH REMOVAL OF LESION RIGHT;  Surgeon: Karle Starch, MD;  Location: [place];  Service: Ophthalmology;  Laterality: Right;  . PACEMAKER IMPLANT N/A 09/10/2017   Procedure: PACEMAKER IMPLANT;  Surgeon: Deboraha Sprang, MD;  Location: [place] CV LAB;  Service: Cardiovascular;  Laterality: N/A;  . PANENDOSCOPY    . PORTACATH PLACEMENT N/A 03/06/2017   Procedure: INSERTION PORT-A-CATH AND ASPIRATE SEROMA LEFT AXILLA;  Surgeon: Fanny Skates, MD;  Location: [place];  Service: General;  Laterality: N/A;  . RIGHT/LEFT HEART CATH AND CORONARY/GRAFT ANGIOGRAPHY N/A 07/28/2017   Procedure: RIGHT/LEFT HEART CATH AND CORONARY/GRAFT ANGIOGRAPHY;  Surgeon: Sherren Mocha, MD;  Location: [place] CV LAB;  Service: Cardiovascular;  Laterality: N/A;  . SHOULDER SURGERY Right 08/2010   screws placed; "tendons tore off"  . SKIN CANCER EXCISION Right    "neck"  . TONSILLECTOMY  ~ 1954  . TOTAL KNEE ARTHROPLASTY Left 06/29/2019   Procedure: TOTAL KNEE ARTHROPLASTY;  Surgeon: Paralee Cancel, MD;  Location: [facility];  Service: Orthopedics;  Laterality: Left;  70 mins  . TRANSCATHETER AORTIC VALVE REPLACEMENT, TRANSFEMORAL N/A 09/09/2017   Procedure: TRANSCATHETER AORTIC VALVE REPLACEMENT, TRANSFEMORAL;  Surgeon: Burnell Blanks, MD;  Location: [place];  Service: Open Heart Surgery;  Laterality: N/A;  . UPPER GI ENDOSCOPY  2013   Gastritis; Dr Carlean Purl  . VASECTOMY       Current Outpatient Medications  Medication Sig Dispense Refill  .  allopurinol (ZYLOPRIM) 300 MG tablet Take 450 mg by mouth daily.     [name] aluminum hydroxide-magnesium carbonate (GAVISCON) 95-358 MG/15ML SUSP Take 15 mLs by mouth as needed for indigestion or heartburn.    [name] amLODipine (NORVASC) 5 MG tablet TAKE 1 TABLET BY MOUTH TWICE A DAY 180 tablet 1  . atorvastatin (LIPITOR) 80 MG tablet TAKE ONE TABLET BY MOUTH AT BEDTIME 90 tablet 1  . azelastine (ASTELIN) 0.1 % nasal spray Place 2 sprays into both nostrils at bedtime as needed for rhinitis or allergies. 30 mL 3  . ELIQUIS 2.5 MG TABS tablet TAKE 1 TABLET BY MOUTH TWICE A DAY 180 tablet 1  . esomeprazole (NEXIUM) 40 MG capsule Take 1 capsule (40 mg total) by mouth daily. 30 capsule 5  . ezetimibe (ZETIA) 10 MG tablet TAKE 1 TABLET BY MOUTH EVERY DAY (Patient taking differently: Take 10 mg by mouth daily. ) 90 tablet 3  . fenofibrate 160 MG  tablet TAKE 1 TABLET BY MOUTH EVERY DAY 90 tablet 1  . folic acid (FOLVITE) 1 MG tablet TAKE 2 TABLETS BY MOUTH EVERY DAY (Patient taking differently: Take 2 mg by mouth daily. ) 180 tablet 4  . furosemide (LASIX) 40 MG tablet TAKE 3 TABLETS BY MOUTH 2 (TWO) TIMES DAILY. 540 tablet 2  . glimepiride (AMARYL) 1 MG tablet Take 1 tablet (1 mg total) by mouth daily with breakfast. 90 tablet 1  . glucose blood (FREESTYLE LITE) test strip Use to test blood sugar once a day.  Dx Code: E11.9 50 strip 1  . KLOR-CON M20 20 MEQ tablet TAKE 2 TABLETS BY MOUTH 2 (TWO) TIMES DAILY. 360 tablet 1  . Lancets (FREESTYLE) lancets USE TWICE A DAY TO CHECK BLOOD SUGAR. DX E11.9 100 each 6  . metFORMIN (GLUCOPHAGE) 1000 MG tablet TAKE 1 & 1/2 TABLETS BY MOUTH EVERY MORNING AND 1 TABLET IN THE EVENING. 225 tablet 1  . metoprolol tartrate (LOPRESSOR) 25 MG tablet TAKE ONE-HALF TABLET BY MOUTH TWICE DAILY (Patient taking differently: Take 12.5 mg by mouth 2 (two) times daily. ) 90 tablet 3  . nitroGLYCERIN (NITROSTAT) 0.4 MG SL tablet PLACE 1 TABLET (0.4 MG TOTAL) UNDER THE TONGUE EVERY 5 (FIVE)  MINUTES AS NEEDED FOR CHEST PAIN. 25 tablet 3  . polycarbophil (FIBERCON) 625 MG tablet Take 625 mg by mouth daily.    . pregabalin (LYRICA) 150 MG capsule Take 1 capsule (150 mg total) by mouth 2 (two) times daily. 180 capsule 1  . psyllium (METAMUCIL) 58.6 % powder Take 1 packet by mouth daily as needed (constipation).     . VASCEPA 1 g capsule TAKE 2 CAPSULES BY MOUTH TWICE A DAY 120 capsule 5   No current facility-administered medications for this visit.   Facility-Administered Medications Ordered in Other Visits  Medication Dose Route Frequency Provider Last Rate Last Admin  . sodium chloride flush (NS) 0.9 % injection 10 mL  10 mL Intravenous PRN [name], MD   10 mL at 05/30/17 0920    Allergies:   Benazepril, Hctz [hydrochlorothiazide], Aspirin, and Lactose intolerance (gi)   ROS:  Please see the history of present illness.   Otherwise, review of systems are positive for none.   All other systems are reviewed and negative.    PHYSICAL EXAM: VS:  BP 134/76   Pulse 69   Temp (!) 97 F (36.1 C)   Ht 6' (1.829 m)   Wt 225 lb 9.6 oz (102.3 kg)   SpO2 91%   BMI 30.60 kg/m  , BMI Body mass index is 30.6 kg/m. GENERAL:  Well appearing NECK:  No jugular venous distention, waveform within normal limits, carotid upstroke brisk and symmetric, no bruits, no thyromegaly LUNGS:  Clear to auscultation bilaterally CHEST:  Unremarkable HEART:  PMI not displaced or sustained,S1 and S2 within normal limits, no S3, no S4, no clicks, no rubs, 2 out of 6 apical systolic murmur radiating up the aortic outflow tract, no diastolic murmurs ABD:  Flat, positive bowel sounds normal in frequency in pitch, no bruits, no rebound, no guarding, no midline pulsatile mass, no hepatomegaly, no splenomegaly EXT:  2 plus pulses throughout, no edema, no cyanosis no clubbing   EKG:  EKG is  ordered today. The ekg ordered sinus rhythm with ventricular pacing 100% capture   Recent Labs: 03/16/2019:  Magnesium 2.1 11/23/2019: ALT 15; BUN 42; Creatinine 1.76; Hemoglobin 12.0; Platelet Count 200; Potassium 4.1; Sodium 138  Lipid Panel    Component Value Date/Time   CHOL 167 11/22/2019 1037   TRIG (H) 11/22/2019 1037    765.0 Triglyceride is over 400; calculations on Lipids are invalid.   HDL 28.30 (L) 11/22/2019 1037   CHOLHDL 6 11/22/2019 1037   VLDL 44.0 (H) 11/24/2018 0815   LDLCALC 57 11/21/2016 1040   LDLDIRECT 48.0 11/22/2019 1037      Wt Readings from Last 3 Encounters:  11/26/19 225 lb 9.6 oz (102.3 kg)  11/23/19 253 lb (114.8 kg)  11/23/19 253 lb (114.8 kg)      Other studies Reviewed: Additional studies/ records that were reviewed today include:  Labs Review of the above records demonstrates:  Please see elsewhere in the note.     ASSESSMENT AND PLAN:   S/p TAVR:  He will be getting an echocardiogram to follow-up of this and his ascending aorta.  I like to try to avoid contrast with his current renal insufficiency.   CADs/p CABG:   He is having no chest pain.  No change in therapy.  2-1 AV blocks/pPPM:  He is up-to-date with device follow-up.  Atrial flutter:   He tolerates anticoagulation and has had no symptomatic recurrence.   Hypertension:Blood pressure is at target.  No change in therapy.  Hyperlipidemia:   LDL is 48.  His triglycerides are elevated and I am going to have him see a nutritionist.  He is on appropriate therapy.  Continue current therapy.   DM 2:  A1c was 7.0 which is up from 6.7.  Follow-up per Carollee Herter, Kendrick Fries R, DO  Ascending aortic aneurysm:  45 mm.  I will follow this up as above avoiding a CT.  Carotid: He has had previous carotid stenosis.   He is overdue for Doppler.  I will arrange Dopplers.   Covid education: He had his vaccine.  Current medicines are reviewed at length with the patient today.  The patient does not have concerns regarding medicines.  The following changes have been made:  None  Labs/  tests ordered today include:    Orders Placed This Encounter  Procedures  . Lipid panel  . Referral to Nutrition and Diabetes Services  . Consult to dietitian  . EKG 12-Lead  . ECHOCARDIOGRAM COMPLETE  . VAS US CAROTID     Disposition:   FU with me in 1 year.     Signed, Minus Breeding, MD  11/26/2019 10:44 AM    [facility]

## 2019-11-26 ENCOUNTER — Ambulatory Visit (INDEPENDENT_AMBULATORY_CARE_PROVIDER_SITE_OTHER): Payer: Medicare Other | Admitting: Cardiology

## 2019-11-26 ENCOUNTER — Encounter: Payer: Self-pay | Admitting: Cardiology

## 2019-11-26 ENCOUNTER — Other Ambulatory Visit: Payer: Self-pay

## 2019-11-26 VITALS — BP 134/76 | HR 69 | Temp 97.0°F | Ht 72.0 in | Wt 225.6 lb

## 2019-11-26 DIAGNOSIS — E1165 Type 2 diabetes mellitus with hyperglycemia: Secondary | ICD-10-CM

## 2019-11-26 DIAGNOSIS — I6523 Occlusion and stenosis of bilateral carotid arteries: Secondary | ICD-10-CM | POA: Diagnosis not present

## 2019-11-26 DIAGNOSIS — I4892 Unspecified atrial flutter: Secondary | ICD-10-CM

## 2019-11-26 DIAGNOSIS — Z952 Presence of prosthetic heart valve: Secondary | ICD-10-CM | POA: Diagnosis not present

## 2019-11-26 DIAGNOSIS — I2510 Atherosclerotic heart disease of native coronary artery without angina pectoris: Secondary | ICD-10-CM

## 2019-11-26 DIAGNOSIS — I350 Nonrheumatic aortic (valve) stenosis: Secondary | ICD-10-CM | POA: Diagnosis not present

## 2019-11-26 DIAGNOSIS — Z7189 Other specified counseling: Secondary | ICD-10-CM

## 2019-11-26 DIAGNOSIS — E1151 Type 2 diabetes mellitus with diabetic peripheral angiopathy without gangrene: Secondary | ICD-10-CM

## 2019-11-26 DIAGNOSIS — I10 Essential (primary) hypertension: Secondary | ICD-10-CM | POA: Diagnosis not present

## 2019-11-26 DIAGNOSIS — E785 Hyperlipidemia, unspecified: Secondary | ICD-10-CM

## 2019-11-26 DIAGNOSIS — IMO0002 Reserved for concepts with insufficient information to code with codable children: Secondary | ICD-10-CM

## 2019-11-26 NOTE — Patient Instructions (Addendum)
Medication Instructions:  NO CHANGES *If you need a refill on your cardiac medications before your next appointment, please call your pharmacy*  Lab Work: Your physician recommends that you return for lab work in 8 weeks (LIPIDS)  Testing/Procedures: Your physician has requested that you have an echocardiogram. Echocardiography is a painless test that uses sound waves to create images of your heart. It provides your doctor with information about the size and shape of your heart and how well your heart's chambers and valves are working. This procedure takes approximately one hour. There are no restrictions for this procedure.  Your physician has requested that you have a carotid duplex. This test is an ultrasound of the carotid arteries in your neck. It looks at blood flow through these arteries that supply the brain with blood. Allow one hour for this exam. There are no restrictions or special instructions.  Follow-Up: At [facility], you and your health needs are our priority.  As part of our continuing mission to provide you with exceptional heart care, we have created designated Provider Care Teams.  These Care Teams include your primary Cardiologist (physician) and Advanced Practice Providers (APPs -  Physician Assistants and Nurse Practitioners) who all work together to provide you with the care you need, when you need it.  Your next appointment:   12 month(s)   You will receive a reminder letter in the mail two months in advance. If you don't receive a letter, please call our office to schedule the follow-up appointment.  The format for your next appointment:   In Person  Provider:   Minus Breeding, MD  Other Instructions Nutrition consult

## 2019-12-02 ENCOUNTER — Other Ambulatory Visit: Payer: Self-pay | Admitting: Cardiology

## 2019-12-05 ENCOUNTER — Other Ambulatory Visit: Payer: Self-pay | Admitting: Cardiology

## 2019-12-07 ENCOUNTER — Ambulatory Visit (INDEPENDENT_AMBULATORY_CARE_PROVIDER_SITE_OTHER): Payer: Medicare Other | Admitting: Dermatology

## 2019-12-07 ENCOUNTER — Other Ambulatory Visit: Payer: Self-pay

## 2019-12-07 DIAGNOSIS — L821 Other seborrheic keratosis: Secondary | ICD-10-CM | POA: Diagnosis not present

## 2019-12-07 DIAGNOSIS — L918 Other hypertrophic disorders of the skin: Secondary | ICD-10-CM

## 2019-12-07 DIAGNOSIS — D692 Other nonthrombocytopenic purpura: Secondary | ICD-10-CM

## 2019-12-07 DIAGNOSIS — L570 Actinic keratosis: Secondary | ICD-10-CM | POA: Diagnosis not present

## 2019-12-07 DIAGNOSIS — L820 Inflamed seborrheic keratosis: Secondary | ICD-10-CM | POA: Diagnosis not present

## 2019-12-07 DIAGNOSIS — L578 Other skin changes due to chronic exposure to nonionizing radiation: Secondary | ICD-10-CM | POA: Diagnosis not present

## 2019-12-07 DIAGNOSIS — I6523 Occlusion and stenosis of bilateral carotid arteries: Secondary | ICD-10-CM | POA: Diagnosis not present

## 2019-12-07 NOTE — Patient Instructions (Addendum)
Recommend daily broad spectrum sunscreen SPF 30+ to sun-exposed areas, reapply every 2 hours as needed. Call for new or changing lesions.  Cryotherapy Aftercare  . Wash gently with soap and water everyday.   [name] Apply Vaseline and Band-Aid daily until healed.  Recommend GoldBond Rough and Bumpy.

## 2019-12-07 NOTE — Progress Notes (Signed)
Follow-Up Visit   Subjective  Richard [name] is a 79 y.o. male who presents for the following: Annual Exam.  Patient here today for UBSE. He does not have a history of skin cancer but has been treated for Large Cell B Lymphoma in the past year. Patient has seen Dr. Nehemiah Massed in the past. There are a few spots he would like checked, one on right elbow that is sore and one behind the R ear. 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  Brother was recently diagnosed with skin cancer.  The following portions of the chart were reviewed this encounter and updated as appropriate:      Review of Systems:  No other skin or systemic complaints except as noted in HPI or Assessment and Plan.  Objective  Well appearing patient in no apparent distress; mood and affect are within normal limits.  All skin waist up examined.  Objective  Left Temple x 3, L ear helix x 1, L forehead x 2, R temple x 3, R ear helix x 1, R cheek x 3, R postauricular x 1 (14): Erythematous thin papules/macules with gritty scale.   Objective  Left Forearm x 2, right elbow x 2, left elbow x 1, R upper arm x 1, L lateral calf x 1 (7): Erythematous keratotic papules/plaques.   Objective  B/L Forearm: Violaceous macules and patches.    Assessment & Plan  AK (actinic keratosis) (14) Left Temple x 3, L ear helix x 1, L forehead x 2, R temple x 3, R ear helix x 1, R cheek x 3, R postauricular x 1  Patient has recent fall and has healing spots on arms.  Will treat AKs on arms on f/up after skin has healed. Will consider PDT field treatment in fall to face/arms.  Will discuss on f/up.  Destruction of lesion - Left Temple x 3, L ear helix x 1, L forehead x 2, R temple x 3, R ear helix x 1, R cheek x 3, R postauricular x 1  Destruction method: cryotherapy   Informed consent: discussed and consent obtained   Lesion destroyed using liquid nitrogen: Yes   Region frozen until ice ball extended beyond lesion: Yes     Outcome: patient tolerated procedure well with no complications   Post-procedure details: wound care instructions given    Inflamed seborrheic keratosis (7) Left Forearm x 2, right elbow x 2, left elbow x 1, R upper arm x 1, L lateral calf x 1  Vs Hypertrophic AK's Recheck on follow up. Consider bx if not improved.  Destruction of lesion - Left Forearm x 2, right elbow x 2, left elbow x 1, R upper arm x 1, L lateral calf x 1  Destruction method: cryotherapy   Informed consent: discussed and consent obtained   Lesion destroyed using liquid nitrogen: Yes   Region frozen until ice ball extended beyond lesion: Yes   Outcome: patient tolerated procedure well with no complications   Post-procedure details: wound care instructions given    Senile purpura (HCC) B/L Forearm  Benign, observe.   Seborrheic Keratoses - Stuck-on, waxy, tan-brown papules and plaques  - Discussed benign etiology and prognosis. - Observe - Call for any changes  Actinic Damage - diffuse scaly erythematous macules with underlying dyspigmentation - Recommend daily broad spectrum sunscreen SPF 30+ to sun-exposed areas, reapply every 2 hours as needed.  - Call for new or changing lesions.  Acrochordons (Skin Tags) -  Fleshy, skin-colored pedunculated papules neck - Benign appearing.  - Observe. - If desired, they can be removed with an in office procedure that is not covered by insurance. - Please call the clinic if you notice any new or changing lesions.  Return in about 3 months (around 03/08/2020) for AK and ISK FOLLOW UP.  Graciella Belton, RMA, am acting as scribe for Brendolyn Patty, MD .  Documentation: I have reviewed the above documentation for accuracy and completeness, and I agree with the above.  Brendolyn Patty MD

## 2019-12-14 ENCOUNTER — Encounter: Payer: Self-pay | Admitting: Podiatry

## 2019-12-14 ENCOUNTER — Ambulatory Visit (INDEPENDENT_AMBULATORY_CARE_PROVIDER_SITE_OTHER): Payer: Medicare Other | Admitting: Podiatry

## 2019-12-14 ENCOUNTER — Other Ambulatory Visit: Payer: Self-pay

## 2019-12-14 DIAGNOSIS — E1142 Type 2 diabetes mellitus with diabetic polyneuropathy: Secondary | ICD-10-CM | POA: Diagnosis not present

## 2019-12-14 DIAGNOSIS — B351 Tinea unguium: Secondary | ICD-10-CM | POA: Diagnosis not present

## 2019-12-14 DIAGNOSIS — M79674 Pain in right toe(s): Secondary | ICD-10-CM | POA: Insufficient documentation

## 2019-12-14 DIAGNOSIS — E1165 Type 2 diabetes mellitus with hyperglycemia: Secondary | ICD-10-CM

## 2019-12-14 DIAGNOSIS — M79675 Pain in left toe(s): Secondary | ICD-10-CM | POA: Diagnosis not present

## 2019-12-14 NOTE — Progress Notes (Signed)
This patient returns to my office for at risk foot care.  This patient requires this care by a professional since this patient will be at risk due to having diabetic neuropathy  and coagulation defect.  Patient is taking eliquis. This patient is unable to cut nails himself since the patient cannot reach his nails.These nails are painful walking and wearing shoes.  This patient presents for at risk foot care today.  General Appearance  Alert, conversant and in no acute stress.  Vascular  Dorsalis pedis and posterior tibial  pulses are palpable  bilaterally.  Capillary return is within normal limits  bilaterally. Temperature is within normal limits  bilaterally.  Neurologic  Senn-Weinstein monofilament wire test within normal limits  Left foot.  LOPS right foot is absent.  . Muscle power within normal limits bilaterally.  Nails Thick disfigured discolored nails with subungual debris  from hallux to fifth toes bilaterally. No evidence of bacterial infection or drainage bilaterally.  Orthopedic  No limitations of motion  feet .  No crepitus or effusions noted.  No bony pathology or digital deformities noted.  Plantar flexed fifth metatarsal right foot.  Skin  normotropic skin with noted bilaterally.  No signs of infections or ulcers noted.   Asymptomatic porokeratosis  Right foot.  Onychomycosis  Pain in right toes  Pain in left toes  Consent was obtained for treatment procedures.   Mechanical debridement of nails 1-5  bilaterally performed with a nail nipper.  Filed with dremel without incident.    Return office visit   12 weeks.                  Told patient to return for periodic foot care and evaluation due to potential at risk complications.   Gardiner Barefoot DPM

## 2019-12-17 ENCOUNTER — Other Ambulatory Visit: Payer: Self-pay | Admitting: Cardiology

## 2019-12-17 ENCOUNTER — Other Ambulatory Visit: Payer: Self-pay

## 2019-12-21 ENCOUNTER — Inpatient Hospital Stay: Payer: Medicare Other

## 2019-12-21 ENCOUNTER — Inpatient Hospital Stay (HOSPITAL_BASED_OUTPATIENT_CLINIC_OR_DEPARTMENT_OTHER): Payer: Medicare Other | Admitting: Family

## 2019-12-21 ENCOUNTER — Other Ambulatory Visit: Payer: Self-pay

## 2019-12-21 ENCOUNTER — Inpatient Hospital Stay: Payer: Medicare Other | Attending: Hematology & Oncology

## 2019-12-21 ENCOUNTER — Encounter: Payer: Self-pay | Admitting: Family

## 2019-12-21 VITALS — BP 130/68 | HR 78 | Temp 97.7°F | Resp 18 | Ht 72.0 in | Wt 252.0 lb

## 2019-12-21 VITALS — BP 130/68 | HR 79 | Temp 97.7°F | Resp 18 | Wt 272.0 lb

## 2019-12-21 DIAGNOSIS — D508 Other iron deficiency anemias: Secondary | ICD-10-CM

## 2019-12-21 DIAGNOSIS — D519 Vitamin B12 deficiency anemia, unspecified: Secondary | ICD-10-CM

## 2019-12-21 DIAGNOSIS — D510 Vitamin B12 deficiency anemia due to intrinsic factor deficiency: Secondary | ICD-10-CM | POA: Insufficient documentation

## 2019-12-21 DIAGNOSIS — D500 Iron deficiency anemia secondary to blood loss (chronic): Secondary | ICD-10-CM

## 2019-12-21 DIAGNOSIS — Z95828 Presence of other vascular implants and grafts: Secondary | ICD-10-CM

## 2019-12-21 DIAGNOSIS — C8332 Diffuse large B-cell lymphoma, intrathoracic lymph nodes: Secondary | ICD-10-CM | POA: Diagnosis not present

## 2019-12-21 DIAGNOSIS — Z9221 Personal history of antineoplastic chemotherapy: Secondary | ICD-10-CM | POA: Diagnosis not present

## 2019-12-21 DIAGNOSIS — C8330 Diffuse large B-cell lymphoma, unspecified site: Secondary | ICD-10-CM | POA: Insufficient documentation

## 2019-12-21 DIAGNOSIS — I6523 Occlusion and stenosis of bilateral carotid arteries: Secondary | ICD-10-CM

## 2019-12-21 LAB — CBC WITH DIFFERENTIAL (CANCER CENTER ONLY)
Abs Immature Granulocytes: 0.09 10*3/uL — ABNORMAL HIGH (ref 0.00–0.07)
Basophils Absolute: 0.1 10*3/uL (ref 0.0–0.1)
Basophils Relative: 1 %
Eosinophils Absolute: 0.2 10*3/uL (ref 0.0–0.5)
Eosinophils Relative: 2 %
HCT: 38.2 % — ABNORMAL LOW (ref 39.0–52.0)
Hemoglobin: 12.3 g/dL — ABNORMAL LOW (ref 13.0–17.0)
Immature Granulocytes: 1 %
Lymphocytes Relative: 24 %
Lymphs Abs: 1.7 10*3/uL (ref 0.7–4.0)
MCH: 28.9 pg (ref 26.0–34.0)
MCHC: 32.2 g/dL (ref 30.0–36.0)
MCV: 89.9 fL (ref 80.0–100.0)
Monocytes Absolute: 0.6 10*3/uL (ref 0.1–1.0)
Monocytes Relative: 9 %
Neutro Abs: 4.4 10*3/uL (ref 1.7–7.7)
Neutrophils Relative %: 63 %
Platelet Count: 217 10*3/uL (ref 150–400)
RBC: 4.25 MIL/uL (ref 4.22–5.81)
RDW: 17.5 % — ABNORMAL HIGH (ref 11.5–15.5)
WBC Count: 6.9 10*3/uL (ref 4.0–10.5)
nRBC: 0.3 % — ABNORMAL HIGH (ref 0.0–0.2)

## 2019-12-21 LAB — CMP (CANCER CENTER ONLY)
ALT: 17 U/L (ref 0–44)
AST: 19 U/L (ref 15–41)
Albumin: 4.6 g/dL (ref 3.5–5.0)
Alkaline Phosphatase: 62 U/L (ref 38–126)
Anion gap: 11 (ref 5–15)
BUN: 34 mg/dL — ABNORMAL HIGH (ref 8–23)
CO2: 27 mmol/L (ref 22–32)
Calcium: 10.4 mg/dL — ABNORMAL HIGH (ref 8.9–10.3)
Chloride: 102 mmol/L (ref 98–111)
Creatinine: 1.59 mg/dL — ABNORMAL HIGH (ref 0.61–1.24)
GFR, Est AFR Am: 46 mL/min — ABNORMAL LOW (ref >60)
GFR, Estimated: 40 mL/min — ABNORMAL LOW (ref >60)
Glucose, Bld: 90 mg/dL (ref 70–99)
Potassium: 3.7 mmol/L (ref 3.5–5.1)
Sodium: 140 mmol/L (ref 135–145)
Total Bilirubin: 0.5 mg/dL (ref 0.3–1.2)
Total Protein: 7.3 g/dL (ref 6.5–8.1)

## 2019-12-21 LAB — VITAMIN B12: Vitamin B-12: 192 pg/mL (ref 180–914)

## 2019-12-21 LAB — RETICULOCYTES
Immature Retic Fract: 29.6 % — ABNORMAL HIGH (ref 2.3–15.9)
RBC.: 4.23 MIL/uL (ref 4.22–5.81)
Retic Count, Absolute: 76.6 10*3/uL (ref 19.0–186.0)
Retic Ct Pct: 1.8 % (ref 0.4–3.1)

## 2019-12-21 MED ORDER — CYANOCOBALAMIN 1000 MCG/ML IJ SOLN
INTRAMUSCULAR | Status: AC
  Filled 2019-12-21: qty 1

## 2019-12-21 MED ORDER — CYANOCOBALAMIN 1000 MCG/ML IJ SOLN
1000.0000 ug | Freq: Once | INTRAMUSCULAR | Status: AC
  Administered 2019-12-21: 1000 ug via INTRAMUSCULAR

## 2019-12-21 MED ORDER — HEPARIN SOD (PORK) LOCK FLUSH 100 UNIT/ML IV SOLN
500.0000 [IU] | Freq: Once | INTRAVENOUS | Status: AC
  Administered 2019-12-21: 500 [IU] via INTRAVENOUS
  Filled 2019-12-21: qty 5

## 2019-12-21 MED ORDER — SODIUM CHLORIDE 0.9% FLUSH
10.0000 mL | Freq: Once | INTRAVENOUS | Status: AC
  Administered 2019-12-21: 10 mL via INTRAVENOUS
  Filled 2019-12-21: qty 10

## 2019-12-21 NOTE — Progress Notes (Signed)
Hematology and Oncology Follow Up Visit  STEFANO TRULSON [id number] [birth year] 79 y.o. 12/21/2019   Principle Diagnosis:  Diffuse large cell non-Hodgkin's lymphoma (IPI = 3) - NOT "double hit" Pernicious anemia Iron deficiency secondary to bleeding  Past Therapy: R-CHOP - s/p cycle8 - completed 08/2017  Current Therapy:        Vitamin B12 1 mg IM every month Xgeva 120 mg subcu q 3 months - next dose due in 03/2020 IV iron as indicated    Interim History:  Mr. Printup is doing well and has no complaints at this time. He is golfing 3 days a week and enjoying the beautiful weather.  B 12 last month was only 140. Iron studies are pending.  No fever, chills, n/v, cough, rash, dizziness, SOB, chest pain, palpitations, abdominal pain or changes in bowel or bladder habits.  The neuropathy in his feet remains stable. No swelling or tenderness noted.  He bruises easily but has not noted any blood loss this last month.  No falls or syncope.  He has maintained a good appetite and is staying properly hydrated. His weight is stable.   ECOG Performance Status: 1 - Symptomatic but completely ambulatory  Medications:  Allergies as of 12/21/2019      Reactions   Benazepril Swelling   angioedema; he is not a candidate for any angiotensin receptor blockers because of this significant allergic reaction. Because of a history of documented adverse serious drug reaction;Medi Alert bracelet  is recommended   Hctz [hydrochlorothiazide] Anaphylaxis, Swelling   Tongue and lip swelling   Aspirin Other (See Comments)   Gastritis, cant take 325 Mg aspirin    Lactose Intolerance (gi) Nausea And Vomiting      Medication List       Accurate as of December 21, 2019  1:59 PM. If you have any questions, ask your nurse or doctor.        allopurinol 300 MG tablet Commonly known as: ZYLOPRIM Take 450 mg by mouth daily.   aluminum hydroxide-magnesium carbonate 95-358 MG/15ML Susp Commonly known as:  GAVISCON Take 15 mLs by mouth as needed for indigestion or heartburn.   amLODipine 5 MG tablet Commonly known as: NORVASC TAKE 1 TABLET BY MOUTH TWICE A DAY   atorvastatin 80 MG tablet Commonly known as: LIPITOR TAKE ONE TABLET BY MOUTH AT BEDTIME   azelastine 0.1 % nasal spray Commonly known as: ASTELIN Place 2 sprays into both nostrils at bedtime as needed for rhinitis or allergies.   Eliquis 2.5 MG Tabs tablet Generic drug: apixaban TAKE 1 TABLET BY MOUTH TWICE A DAY   esomeprazole 40 MG capsule Commonly known as: NexIUM Take 1 capsule (40 mg total) by mouth daily.   ezetimibe 10 MG tablet Commonly known as: ZETIA TAKE 1 TABLET BY MOUTH EVERY DAY   fenofibrate 160 MG tablet TAKE 1 TABLET BY MOUTH EVERY DAY   folic acid 1 MG tablet Commonly known as: FOLVITE TAKE 2 TABLETS BY MOUTH EVERY DAY   freestyle lancets USE TWICE A DAY TO CHECK BLOOD SUGAR. DX E11.9   FREESTYLE LITE test strip Generic drug: glucose blood Use to test blood sugar once a day.  Dx Code: E11.9   furosemide 40 MG tablet Commonly known as: LASIX TAKE 3 TABLETS BY MOUTH 2 (TWO) TIMES DAILY.   glimepiride 1 MG tablet Commonly known as: Amaryl Take 1 tablet (1 mg total) by mouth daily with breakfast.   Klor-Con M20 20 MEQ tablet Generic drug: potassium chloride  SA TAKE 2 TABLETS BY MOUTH 2 (TWO) TIMES DAILY.   metFORMIN 1000 MG tablet Commonly known as: GLUCOPHAGE TAKE 1 & 1/2 TABLETS BY MOUTH EVERY MORNING AND 1 TABLET IN THE EVENING.   metoprolol tartrate 25 MG tablet Commonly known as: LOPRESSOR TAKE ONE-HALF TABLET BY MOUTH TWICE DAILY   nitroGLYCERIN 0.4 MG SL tablet Commonly known as: NITROSTAT PLACE 1 TABLET (0.4 MG TOTAL) UNDER THE TONGUE EVERY 5 (FIVE) MINUTES AS NEEDED FOR CHEST PAIN.   polycarbophil 625 MG tablet Commonly known as: FIBERCON Take 625 mg by mouth daily.   pregabalin 150 MG capsule Commonly known as: Lyrica Take 1 capsule (150 mg total) by mouth 2 (two)  times daily.   psyllium 58.6 % powder Commonly known as: METAMUCIL Take 1 packet by mouth daily as needed (constipation).   Vascepa 1 g capsule Generic drug: icosapent Ethyl TAKE 2 CAPSULES BY MOUTH TWICE A DAY       Allergies:  Allergies  Allergen Reactions  . Benazepril Swelling    angioedema; he is not a candidate for any angiotensin receptor blockers because of this significant allergic reaction. Because of a history of documented adverse serious drug reaction;Medi Alert bracelet  is recommended  . Hctz [Hydrochlorothiazide] Anaphylaxis and Swelling    Tongue and lip swelling   . Aspirin Other (See Comments)    Gastritis, cant take 325 Mg aspirin   . Lactose Intolerance (Gi) Nausea And Vomiting    Past Medical History, Surgical history, Social history, and Family History were reviewed and updated.  Review of Systems: All other 10 point review of systems is negative.   Physical Exam:  height is 6' (1.829 m) and weight is 252 lb (114.3 kg). His temporal temperature is 97.7 F (36.5 C). His blood pressure is 130/68 and his pulse is 78. His respiration is 18 and oxygen saturation is 95%.   Wt Readings from Last 3 Encounters:  12/21/19 252 lb (114.3 kg)  12/21/19 272 lb (123.4 kg)  11/26/19 225 lb 9.6 oz (102.3 kg)    Ocular: Sclerae unicteric, pupils equal, round and reactive to light Ear-nose-throat: Oropharynx clear, dentition fair Lymphatic: No cervical or supraclavicular adenopathy Lungs no rales or rhonchi, good excursion bilaterally Heart regular rate and rhythm, no murmur appreciated Abd soft, nontender, positive bowel sounds, no liver or spleen tip palpated on exam, no fluid wave  MSK no focal spinal tenderness, no joint edema Neuro: non-focal, well-oriented, appropriate affect Breasts: Deferred   Lab Results  Component Value Date   WBC 6.9 12/21/2019   HGB 12.3 (L) 12/21/2019   HCT 38.2 (L) 12/21/2019   MCV 89.9 12/21/2019   PLT 217 12/21/2019    Lab Results  Component Value Date   FERRITIN 1,501 (H) 11/23/2019   IRON 78 11/23/2019   TIBC 365 11/23/2019   UIBC 287 11/23/2019   IRONPCTSAT 21 11/23/2019   Lab Results  Component Value Date   RETICCTPCT 1.8 12/21/2019   RBC 4.23 12/21/2019   RETICCTABS 52.1 11/22/2011   No results found for: [facility] Lab Results  Component Value Date   IGA 159 03/09/2012   Lab Results  Component Value Date   ALBUMINELP 4.2 07/27/2018   MSPIKE Not Observed 07/27/2018     Chemistry      Component Value Date/Time   NA 138 11/23/2019 1223   NA 136 03/16/2019 0938   NA 144 06/27/2017 0857   K 4.1 11/23/2019 1223   K 3.9 06/27/2017 0857  CL 99 11/23/2019 1223   CL 103 06/27/2017 0857   CO2 28 11/23/2019 1223   CO2 26 06/27/2017 0857   BUN 42 (H) 11/23/2019 1223   BUN 34 (H) 03/16/2019 0938   BUN 12 06/27/2017 0857   CREATININE 1.76 (H) 11/23/2019 1223   CREATININE 1.0 06/27/2017 0857      Component Value Date/Time   CALCIUM 10.1 11/23/2019 1223   CALCIUM 9.2 06/27/2017 0857   ALKPHOS 63 11/23/2019 1223   ALKPHOS 128 (H) 06/27/2017 0857   AST 18 11/23/2019 1223   ALT 15 11/23/2019 1223   ALT 24 06/27/2017 0857   BILITOT 0.5 11/23/2019 1223       Impression and Plan: Mr. Erekson is a very pleasant 79 yo caucasian gentleman with diffuse large B-cell lymphoma (not "double hit" lymphoma). He completed treatment in February 2019 and remains in remission.  He received B 12 today.  Iron studies are pending. We will replace if needed.  We will plan to see him back in another 3 months.  He will contact our office with any questions or concerns. We can certainly see him sooner if need be.   Laverna Peace, NP 6/22/20211:59 PM

## 2019-12-21 NOTE — Patient Instructions (Signed)

## 2019-12-21 NOTE — Patient Instructions (Signed)

## 2019-12-22 LAB — IRON AND TIBC
Iron: 79 ug/dL (ref 42–163)
Saturation Ratios: 21 % (ref 20–55)
TIBC: 383 ug/dL (ref 202–409)
UIBC: 303 ug/dL (ref 117–376)

## 2019-12-22 LAB — FERRITIN: Ferritin: 1258 ng/mL — ABNORMAL HIGH (ref 24–336)

## 2019-12-23 ENCOUNTER — Other Ambulatory Visit: Payer: Self-pay

## 2019-12-23 ENCOUNTER — Ambulatory Visit (HOSPITAL_COMMUNITY): Payer: Medicare Other | Attending: Cardiology

## 2019-12-23 DIAGNOSIS — I350 Nonrheumatic aortic (valve) stenosis: Secondary | ICD-10-CM | POA: Diagnosis present

## 2019-12-23 DIAGNOSIS — Z952 Presence of prosthetic heart valve: Secondary | ICD-10-CM

## 2019-12-28 ENCOUNTER — Ambulatory Visit (HOSPITAL_COMMUNITY)
Admission: RE | Admit: 2019-12-28 | Discharge: 2019-12-28 | Disposition: A | Discharge status: Home / Self Care | Payer: Medicare Other | Source: Ambulatory Visit | Attending: Cardiovascular Disease | Admitting: Cardiovascular Disease

## 2019-12-28 ENCOUNTER — Other Ambulatory Visit: Payer: Self-pay

## 2019-12-28 DIAGNOSIS — I6523 Occlusion and stenosis of bilateral carotid arteries: Secondary | ICD-10-CM

## 2019-12-29 ENCOUNTER — Telehealth: Payer: Self-pay | Admitting: Cardiology

## 2019-12-29 NOTE — Telephone Encounter (Signed)
The patient has been notified of the ECHO result and verbalized understanding.  All questions (if any) were answered. Wilma Flavin, RN 12/29/2019 1:40 PM

## 2019-12-29 NOTE — Telephone Encounter (Signed)
Follow Up:   Pt would like to know if his Echo results are ready please/

## 2020-01-11 ENCOUNTER — Other Ambulatory Visit: Payer: Self-pay

## 2020-01-11 ENCOUNTER — Other Ambulatory Visit: Payer: Self-pay | Admitting: Cardiology

## 2020-01-11 ENCOUNTER — Encounter: Payer: Self-pay | Admitting: Dietician

## 2020-01-11 ENCOUNTER — Encounter: Payer: Medicare Other | Attending: Cardiology | Admitting: Dietician

## 2020-01-11 DIAGNOSIS — E1151 Type 2 diabetes mellitus with diabetic peripheral angiopathy without gangrene: Secondary | ICD-10-CM | POA: Insufficient documentation

## 2020-01-11 DIAGNOSIS — IMO0002 Reserved for concepts with insufficient information to code with codable children: Secondary | ICD-10-CM

## 2020-01-11 DIAGNOSIS — E1165 Type 2 diabetes mellitus with hyperglycemia: Secondary | ICD-10-CM | POA: Diagnosis not present

## 2020-01-11 NOTE — Patient Instructions (Signed)
Try keeping G2 Gatorade on hand [place] the golf course if you need to prevent your blood sugar from dropping between breakfast and lunch. Or, have a small snack (with carbohydrates and protein) such as grapes and nuts.   If you eat an early dinner and need a snack before bed, use your Balanced Snacks sheet (remember eat carbohydrate and protein foods) for ideas such as whole wheat crackers with peanut butter.   Use your Meal Ideas sheet to create balanced meals including vegetables, carbohydrates, and lean protein.   Remember to limit saturated/trans fats by avoiding high fat animal foods (such as full-fat dairy, bacon, sausage, ground beef, dark meat poultry/poultry with skin, etc), fried foods, and processed foods. Instead, choose heart healthy fat sources such as olive oil, nuts, seafood, avocados, etc.

## 2020-01-11 NOTE — Progress Notes (Signed)
Diabetes Self-Management Education  Visit Type: First/Initial  Appt. Start Time: 10:45am   Appt. End Time: 11:40am  01/11/2020  Richard [name], identified by name and date of birth, is a 79 y.o. male with a diagnosis of Diabetes: Type 2.   ASSESSMENT  Patient states he would like to make sure his A1c does not continue to increase and to know how to eat better to better control his triglycerides. Lives alone, gets in some activity daily, and plays golf 3 days per week. Typical meal pattern is 3 meals per day plus maybe a snack in the evening. States he likes to eat dinner early (~5:00pm) but finds himself hungry before bed (~10:00pm). States he sometimes has low blood sugar at night if he eats a light dinner and no snack before bed. States he avoids bread but if he does have some it is whole wheat. Eats out occasionally, may have [employer] and order a chicken sandwich and nuggets. States he doesn't eat fries much anymore, hasn't had any in 3 months. States he really likes cheese and will have this with his breakfast and sometimes with lunch as well.    Diabetes Self-Management Education - 01/11/20 1058      Visit Information   Visit Type First/Initial      Initial Visit   Diabetes Type Type 2    Are you currently following a meal plan? No    Are you taking your medications as prescribed? Yes    Date Diagnosed 2001      Health Coping   How would you rate your overall health? Good      Psychosocial Assessment   Patient Belief/Attitude about Diabetes Motivated to manage diabetes    Self-care barriers None    Self-management support Doctor's office    Patient Concerns Nutrition/Meal planning    Special Needs None    Preferred Learning Style No preference indicated    Learning Readiness Ready    How often do you need to have someone help you when you read instructions, pamphlets, or other written materials from your doctor or pharmacy? 1 - Never    What is the last grade level you  completed in school? college graduate      Complications   Last HgB A1C per patient/outside source 7 %    How often do you check your blood sugar? 1-2 times/day    Fasting Blood glucose range (mg/dL) 130-179;180-200    Postprandial Blood glucose range (mg/dL) 130-179;180-200    Number of hypoglycemic episodes per month 1    Can you tell when your blood sugar is low? Yes    What do you do if your blood sugar is low? drink regular gatorade    Have you had a dilated eye exam in the past 12 months? Yes    Have you had a dental exam in the past 12 months? Yes    Are you checking your feet? Yes    How many days per week are you checking your feet? 5      Dietary Intake   Breakfast 2 scrambled eggs + 1 slice cheese + tomatoes    Snack (morning) Gatorade Zero    Lunch sandwich on whole wheat   or ham + tomatoes + lettuce + cheese + mayo   Dinner beef roast + potatoes + carrots/onions    Beverage(s) water, Gatorade (for hypoglycemia), Gatorade Zero      Exercise   Exercise Type ADL's;Light (walking / raking leaves)  golf 3 days/week   How many days per week to you exercise? 7    How many minutes per day do you exercise? 25    Total minutes per week of exercise 175      Patient Education   Previous Diabetes Education No    Nutrition management  Role of diet in the treatment of diabetes and the relationship between the three main macronutrients and blood glucose level;Food label reading, portion sizes and measuring food.;Information on hints to eating out and maintain blood glucose control.;Meal options for control of blood glucose level and chronic complications.    Chronic complications Lipid levels, blood glucose control and heart disease      Individualized Goals (developed by patient)   Nutrition General guidelines for healthy choices and portions discussed      Outcomes   Expected Outcomes Demonstrated interest in learning. Expect positive outcomes    Future DMSE PRN            Individualized Plan for Diabetes Self-Management Training:   Learning Objective:  Patient will have a greater understanding of diabetes self-management. Patient education plan is to attend individual and/or group sessions per assessed needs and concerns.   Plan:  Patient Instructions  Try keeping G2 Gatorade on hand at the golf course if you need to prevent your blood sugar from dropping between breakfast and lunch. Or, have a small snack (with carbohydrates and protein) such as grapes and nuts.   If you eat an early dinner and need a snack before bed, use your Balanced Snacks sheet (remember eat carbohydrate and protein foods) for ideas such as whole wheat crackers with peanut butter.   Use your Meal Ideas sheet to create balanced meals including vegetables, carbohydrates, and lean protein.   Remember to limit saturated/trans fats by avoiding high fat animal foods (such as full-fat dairy, bacon, sausage, ground beef, dark meat poultry/poultry with skin, etc), fried foods, and processed foods. Instead, choose heart healthy fat sources such as olive oil, nuts, seafood, avocados, etc.    Expected Outcomes:  Demonstrated interest in learning. Expect positive outcomes  Education material provided: My Plate and Snack sheet, Meal Ideas, Types of Fat  If problems or questions, patient to contact team via:  Phone and Email  Future DSME appointment: PRN. Patient would like to call back for follow up once he has labs checked again.

## 2020-01-18 ENCOUNTER — Encounter: Payer: Self-pay | Admitting: Family

## 2020-01-18 ENCOUNTER — Inpatient Hospital Stay: Payer: Medicare Other

## 2020-01-18 ENCOUNTER — Other Ambulatory Visit: Payer: Self-pay

## 2020-01-18 ENCOUNTER — Inpatient Hospital Stay: Payer: Medicare Other | Attending: Hematology & Oncology | Admitting: Family

## 2020-01-18 VITALS — BP 118/70 | HR 78 | Temp 97.8°F | Resp 17

## 2020-01-18 VITALS — BP 118/70 | HR 82 | Temp 97.8°F | Resp 18 | Ht 72.0 in | Wt 252.0 lb

## 2020-01-18 DIAGNOSIS — D500 Iron deficiency anemia secondary to blood loss (chronic): Secondary | ICD-10-CM

## 2020-01-18 DIAGNOSIS — D519 Vitamin B12 deficiency anemia, unspecified: Secondary | ICD-10-CM | POA: Diagnosis not present

## 2020-01-18 DIAGNOSIS — C8338 Diffuse large B-cell lymphoma, lymph nodes of multiple sites: Secondary | ICD-10-CM | POA: Diagnosis not present

## 2020-01-18 DIAGNOSIS — Z886 Allergy status to analgesic agent status: Secondary | ICD-10-CM | POA: Insufficient documentation

## 2020-01-18 DIAGNOSIS — C8332 Diffuse large B-cell lymphoma, intrathoracic lymph nodes: Secondary | ICD-10-CM

## 2020-01-18 DIAGNOSIS — I6523 Occlusion and stenosis of bilateral carotid arteries: Secondary | ICD-10-CM

## 2020-01-18 DIAGNOSIS — D510 Vitamin B12 deficiency anemia due to intrinsic factor deficiency: Secondary | ICD-10-CM | POA: Insufficient documentation

## 2020-01-18 DIAGNOSIS — G629 Polyneuropathy, unspecified: Secondary | ICD-10-CM | POA: Insufficient documentation

## 2020-01-18 DIAGNOSIS — D508 Other iron deficiency anemias: Secondary | ICD-10-CM

## 2020-01-18 LAB — RETICULOCYTES
Immature Retic Fract: 25.7 % — ABNORMAL HIGH (ref 2.3–15.9)
RBC.: 4.09 MIL/uL — ABNORMAL LOW (ref 4.22–5.81)
Retic Count, Absolute: 83 10*3/uL (ref 19.0–186.0)
Retic Ct Pct: 2 % (ref 0.4–3.1)

## 2020-01-18 LAB — VITAMIN B12: Vitamin B-12: 186 pg/mL (ref 180–914)

## 2020-01-18 LAB — CBC WITH DIFFERENTIAL (CANCER CENTER ONLY)
Abs Immature Granulocytes: 0.09 10*3/uL — ABNORMAL HIGH (ref 0.00–0.07)
Basophils Absolute: 0.1 10*3/uL (ref 0.0–0.1)
Basophils Relative: 1 %
Eosinophils Absolute: 0.2 10*3/uL (ref 0.0–0.5)
Eosinophils Relative: 3 %
HCT: 37 % — ABNORMAL LOW (ref 39.0–52.0)
Hemoglobin: 11.8 g/dL — ABNORMAL LOW (ref 13.0–17.0)
Immature Granulocytes: 1 %
Lymphocytes Relative: 20 %
Lymphs Abs: 1.5 10*3/uL (ref 0.7–4.0)
MCH: 28.9 pg (ref 26.0–34.0)
MCHC: 31.9 g/dL (ref 30.0–36.0)
MCV: 90.7 fL (ref 80.0–100.0)
Monocytes Absolute: 0.6 10*3/uL (ref 0.1–1.0)
Monocytes Relative: 9 %
Neutro Abs: 4.9 10*3/uL (ref 1.7–7.7)
Neutrophils Relative %: 66 %
Platelet Count: 211 10*3/uL (ref 150–400)
RBC: 4.08 MIL/uL — ABNORMAL LOW (ref 4.22–5.81)
RDW: 16.7 % — ABNORMAL HIGH (ref 11.5–15.5)
WBC Count: 7.4 10*3/uL (ref 4.0–10.5)
nRBC: 0 % (ref 0.0–0.2)

## 2020-01-18 LAB — CMP (CANCER CENTER ONLY)
ALT: 14 U/L (ref 0–44)
AST: 17 U/L (ref 15–41)
Albumin: 4.9 g/dL (ref 3.5–5.0)
Alkaline Phosphatase: 63 U/L (ref 38–126)
Anion gap: 10 (ref 5–15)
BUN: 34 mg/dL — ABNORMAL HIGH (ref 8–23)
CO2: 28 mmol/L (ref 22–32)
Calcium: 10.7 mg/dL — ABNORMAL HIGH (ref 8.9–10.3)
Chloride: 102 mmol/L (ref 98–111)
Creatinine: 1.64 mg/dL — ABNORMAL HIGH (ref 0.61–1.24)
GFR, Est AFR Am: 45 mL/min — ABNORMAL LOW (ref >60)
GFR, Estimated: 39 mL/min — ABNORMAL LOW (ref >60)
Glucose, Bld: 112 mg/dL — ABNORMAL HIGH (ref 70–99)
Potassium: 3.8 mmol/L (ref 3.5–5.1)
Sodium: 140 mmol/L (ref 135–145)
Total Bilirubin: 0.5 mg/dL (ref 0.3–1.2)
Total Protein: 7.2 g/dL (ref 6.5–8.1)

## 2020-01-18 MED ORDER — CYANOCOBALAMIN 1000 MCG/ML IJ SOLN
1000.0000 ug | Freq: Once | INTRAMUSCULAR | Status: AC
  Administered 2020-01-18: 1000 ug via INTRAMUSCULAR

## 2020-01-18 MED ORDER — HEPARIN SOD (PORK) LOCK FLUSH 100 UNIT/ML IV SOLN
500.0000 [IU] | Freq: Once | INTRAVENOUS | Status: AC
  Administered 2020-01-18: 500 [IU] via INTRAVENOUS
  Filled 2020-01-18: qty 5

## 2020-01-18 MED ORDER — SODIUM CHLORIDE 0.9% FLUSH
10.0000 mL | INTRAVENOUS | Status: DC | PRN
  Administered 2020-01-18: 10 mL via INTRAVENOUS
  Filled 2020-01-18: qty 10

## 2020-01-18 MED ORDER — CYANOCOBALAMIN 1000 MCG/ML IJ SOLN
INTRAMUSCULAR | Status: AC
  Filled 2020-01-18: qty 1

## 2020-01-18 NOTE — Patient Instructions (Signed)
Implanted Port Insertion, Care After °This sheet gives you information about how to care for yourself after your procedure. Your health care provider may also give you more specific instructions. If you have problems or questions, contact your health care provider. °What can I expect after the procedure? °After the procedure, it is common to have: °· Discomfort at the port insertion site. °· Bruising on the skin over the port. This should improve over 3-4 days. °Follow these instructions at home: °Port care °· After your port is placed, you will get a manufacturer's information card. The card has information about your port. Keep this card with you at all times. °· Take care of the port as told by your health care provider. Ask your health care provider if you or a family member can get training for taking care of the port at home. A home health care nurse may also take care of the port. °· Make sure to remember what type of port you have. °Incision care ° °  ° °· Follow instructions from your health care provider about how to take care of your port insertion site. Make sure you: °? Wash your hands with soap and water before and after you change your bandage (dressing). If soap and water are not available, use hand sanitizer. °? Change your dressing as told by your health care provider. °? Leave stitches (sutures), skin glue, or adhesive strips in place. These skin closures may need to stay in place for 2 weeks or longer. If adhesive strip edges start to loosen and curl up, you may trim the loose edges. Do not remove adhesive strips completely unless your health care provider tells you to do that. °· Check your port insertion site every day for signs of infection. Check for: °? Redness, swelling, or pain. °? Fluid or blood. °? Warmth. °? Pus or a bad smell. °Activity °· Return to your normal activities as told by your health care provider. Ask your health care provider what activities are safe for you. °· Do not  lift anything that is heavier than 10 lb (4.5 kg), or the limit that you are told, until your health care provider says that it is safe. °General instructions °· Take over-the-counter and prescription medicines only as told by your health care provider. °· Do not take baths, swim, or use a hot tub until your health care provider approves. Ask your health care provider if you may take showers. You may only be allowed to take sponge baths. °· Do not drive for 24 hours if you were given a sedative during your procedure. °· Wear a medical alert bracelet in case of an emergency. This will tell any health care providers that you have a port. °· Keep all follow-up visits as told by your health care provider. This is important. °Contact a health care provider if: °· You cannot flush your port with saline as directed, or you cannot draw blood from the port. °· You have a fever or chills. °· You have redness, swelling, or pain around your port insertion site. °· You have fluid or blood coming from your port insertion site. °· Your port insertion site feels warm to the touch. °· You have pus or a bad smell coming from the port insertion site. °Get help right away if: °· You have chest pain or shortness of breath. °· You have bleeding from your port that you cannot control. °Summary °· Take care of the port as told by your health   care provider. Keep the manufacturer's information card with you at all times. °· Change your dressing as told by your health care provider. °· Contact a health care provider if you have a fever or chills or if you have redness, swelling, or pain around your port insertion site. °· Keep all follow-up visits as told by your health care provider. °This information is not intended to replace advice given to you by your health care provider. Make sure you discuss any questions you have with your health care provider. °Document Revised: 01/13/2018 Document Reviewed: 01/13/2018 °Elsevier Patient Education ©  2020 [employer] ° °

## 2020-01-18 NOTE — Patient Instructions (Signed)

## 2020-01-18 NOTE — Progress Notes (Signed)
Hematology and Oncology Follow Up Visit  Richard [name] [id number] Feb 12, 1941 79 y.o. 01/18/2020   Principle Diagnosis:  Diffuse large cell non-Hodgkin's lymphoma (IPI = 3) - NOT "double hit" Pernicious anemia Iron deficiency secondary to bleeding  Past Therapy: R-CHOP - s/p cycle8 - completed 08/2017  Current Therapy: Vitamin B12 1 mg IM every month Xgeva 120 mg subcu q 3 months - next dose due in 03/2020 IV iron as indicated    Interim History:  Richard [name] is here today for follow-up. He is doing well and has no complaints. He is still golfing 3 days a week.  He recently got a life alert button which makes him feel much better about living by himself.  B 12 last month was 192, ferritin 1,258 and iron saturation 21%.  No bleeding, abnormal bruising or petechiae.  No fever, chills, n/v, cough, rash, dizziness, SOB, chest pain, palpitations, abdominal pain or changes in bowel or bladder habits.  The swelling in his legs has almost resolved with diuretics.  Neuropathy in his feet is unchanged.  No falls or syncope.  He has maintained a good appetite and is staying well hydrated. His weight is stable.    ECOG Performance Status: 1 - Symptomatic but completely ambulatory  Medications:  Allergies as of 01/18/2020      Reactions   Benazepril Swelling   angioedema; he is not a candidate for any angiotensin receptor blockers because of this significant allergic reaction. Because of a history of documented adverse serious drug reaction;Medi Alert bracelet  is recommended   Hctz [hydrochlorothiazide] Anaphylaxis, Swelling   Tongue and lip swelling   Aspirin Other (See Comments)   Gastritis, cant take 325 Mg aspirin    Lactose Intolerance (gi) Nausea And Vomiting      Medication List       Accurate as of January 18, 2020 11:22 AM. If you have any questions, ask your nurse or doctor.        allopurinol 300 MG tablet Commonly known as: ZYLOPRIM Take 450 mg by mouth  daily.   aluminum hydroxide-magnesium carbonate 95-358 MG/15ML Susp Commonly known as: GAVISCON Take 15 mLs by mouth as needed for indigestion or heartburn.   amLODipine 5 MG tablet Commonly known as: NORVASC TAKE 1 TABLET BY MOUTH TWICE A DAY   atorvastatin 80 MG tablet Commonly known as: LIPITOR TAKE ONE TABLET BY MOUTH AT BEDTIME   azelastine 0.1 % nasal spray Commonly known as: ASTELIN Place 2 sprays into both nostrils at bedtime as needed for rhinitis or allergies.   Eliquis 2.5 MG Tabs tablet Generic drug: apixaban TAKE 1 TABLET BY MOUTH TWICE A DAY   esomeprazole 40 MG capsule Commonly known as: NexIUM Take 1 capsule (40 mg total) by mouth daily.   ezetimibe 10 MG tablet Commonly known as: ZETIA TAKE 1 TABLET BY MOUTH EVERY DAY   fenofibrate 160 MG tablet TAKE 1 TABLET BY MOUTH EVERY DAY   folic acid 1 MG tablet Commonly known as: FOLVITE TAKE 2 TABLETS BY MOUTH EVERY DAY   freestyle lancets USE TWICE A DAY TO CHECK BLOOD SUGAR. DX E11.9   FREESTYLE LITE test strip Generic drug: glucose blood Use to test blood sugar once a day.  Dx Code: E11.9   furosemide 40 MG tablet Commonly known as: LASIX TAKE 3 TABLETS BY MOUTH 2 (TWO) TIMES DAILY.   glimepiride 1 MG tablet Commonly known as: Amaryl Take 1 tablet (1 mg total) by mouth daily with breakfast.  Klor-Con M20 20 MEQ tablet Generic drug: potassium chloride SA TAKE 2 TABLETS BY MOUTH 2 (TWO) TIMES DAILY.   metFORMIN 1000 MG tablet Commonly known as: GLUCOPHAGE TAKE 1 & 1/2 TABLETS BY MOUTH EVERY MORNING AND 1 TABLET IN THE EVENING.   metoprolol tartrate 25 MG tablet Commonly known as: LOPRESSOR TAKE ONE-HALF TABLET BY MOUTH TWICE DAILY   nitroGLYCERIN 0.4 MG SL tablet Commonly known as: NITROSTAT PLACE 1 TABLET (0.4 MG TOTAL) UNDER THE TONGUE EVERY 5 (FIVE) MINUTES AS NEEDED FOR CHEST PAIN.   polycarbophil 625 MG tablet Commonly known as: FIBERCON Take 625 mg by mouth daily.   pregabalin  150 MG capsule Commonly known as: Lyrica Take 1 capsule (150 mg total) by mouth 2 (two) times daily.   psyllium 58.6 % powder Commonly known as: METAMUCIL Take 1 packet by mouth daily as needed (constipation).   Vascepa 1 g capsule Generic drug: icosapent Ethyl TAKE 2 CAPSULES BY MOUTH TWICE A DAY       Allergies:  Allergies  Allergen Reactions  . Benazepril Swelling    angioedema; he is not a candidate for any angiotensin receptor blockers because of this significant allergic reaction. Because of a history of documented adverse serious drug reaction;Medi Alert bracelet  is recommended  . Hctz [Hydrochlorothiazide] Anaphylaxis and Swelling    Tongue and lip swelling   . Aspirin Other (See Comments)    Gastritis, cant take 325 Mg aspirin   . Lactose Intolerance (Gi) Nausea And Vomiting    Past Medical History, Surgical history, Social history, and Family History were reviewed and updated.  Review of Systems: All other 10 point review of systems is negative.   Physical Exam:  height is 6' (1.829 m) and weight is 252 lb (114.3 kg). His oral temperature is 97.8 F (36.6 C). His blood pressure is 118/70 and his pulse is 82. His respiration is 18 and oxygen saturation is 94%.   Wt Readings from Last 3 Encounters:  01/18/20 252 lb (114.3 kg)  12/21/19 252 lb (114.3 kg)  12/21/19 272 lb (123.4 kg)    Ocular: Sclerae unicteric, pupils equal, round and reactive to light Ear-nose-throat: Oropharynx clear, dentition fair Lymphatic: No cervical or supraclavicular adenopathy Lungs no rales or rhonchi, good excursion bilaterally Heart regular rate and rhythm, no murmur appreciated Abd soft, nontender, positive bowel sounds, no liver or spleen tip palpated on exam, no fluid wave  MSK no focal spinal tenderness, no joint edema Neuro: non-focal, well-oriented, appropriate affect Breasts: Deferred   Lab Results  Component Value Date   WBC 7.4 01/18/2020   HGB 11.8 (L) 01/18/2020    HCT 37.0 (L) 01/18/2020   MCV 90.7 01/18/2020   PLT 211 01/18/2020   Lab Results  Component Value Date   FERRITIN 1,258 (H) 12/21/2019   IRON 79 12/21/2019   TIBC 383 12/21/2019   UIBC 303 12/21/2019   IRONPCTSAT 21 12/21/2019   Lab Results  Component Value Date   RETICCTPCT 2.0 01/18/2020   RBC 4.08 (L) 01/18/2020   RBC 4.09 (L) 01/18/2020   RETICCTABS 52.1 11/22/2011   No results found for: [facility] Lab Results  Component Value Date   IGA 159 03/09/2012   Lab Results  Component Value Date   ALBUMINELP 4.2 07/27/2018   MSPIKE Not Observed 07/27/2018     Chemistry      Component Value Date/Time   NA 140 12/21/2019 1345   NA 136 03/16/2019 0938   NA 144 06/27/2017 0857  K 3.7 12/21/2019 1345   K 3.9 06/27/2017 0857   CL 102 12/21/2019 1345   CL 103 06/27/2017 0857   CO2 27 12/21/2019 1345   CO2 26 06/27/2017 0857   BUN 34 (H) 12/21/2019 1345   BUN 34 (H) 03/16/2019 0938   BUN 12 06/27/2017 0857   CREATININE 1.59 (H) 12/21/2019 1345   CREATININE 1.0 06/27/2017 0857      Component Value Date/Time   CALCIUM 10.4 (H) 12/21/2019 1345   CALCIUM 9.2 06/27/2017 0857   ALKPHOS 62 12/21/2019 1345   ALKPHOS 128 (H) 06/27/2017 0857   AST 19 12/21/2019 1345   ALT 17 12/21/2019 1345   ALT 24 06/27/2017 0857   BILITOT 0.5 12/21/2019 1345       Impression and Plan: Mr. Zawadzki is a very pleasant 79 yo caucasian gentleman withdiffuse large B-cell lymphoma (not"double hit"lymphoma). He completed treatment in February 2019 and so far he remains in remission.  He received B 12 today.  Iron studies are pending. We will replace if needed.  We will plan to see him back in another month He will contact our office with any questions or concerns. We can certainly see him sooner if need be.  Laverna Peace, NP 7/20/202111:22 AM

## 2020-01-19 LAB — IRON AND TIBC
Iron: 79 ug/dL (ref 42–163)
Saturation Ratios: 21 % (ref 20–55)
TIBC: 370 ug/dL (ref 202–409)
UIBC: 291 ug/dL (ref 117–376)

## 2020-01-19 LAB — FERRITIN: Ferritin: 1070 ng/mL — ABNORMAL HIGH (ref 24–336)

## 2020-01-20 ENCOUNTER — Other Ambulatory Visit: Payer: Self-pay | Admitting: Cardiology

## 2020-01-20 DIAGNOSIS — M545 Low back pain: Secondary | ICD-10-CM | POA: Diagnosis not present

## 2020-01-25 DIAGNOSIS — E785 Hyperlipidemia, unspecified: Secondary | ICD-10-CM | POA: Diagnosis not present

## 2020-01-25 LAB — LIPID PANEL
Chol/HDL Ratio: 5.1 ratio — ABNORMAL HIGH (ref 0.0–5.0)
Cholesterol, Total: 159 mg/dL (ref 100–199)
HDL: 31 mg/dL — ABNORMAL LOW (ref >39)
LDL Chol Calc (NIH): 73 mg/dL (ref 0–99)
Triglycerides: 342 mg/dL — ABNORMAL HIGH (ref 0–149)
VLDL Cholesterol Cal: 55 mg/dL — ABNORMAL HIGH (ref 5–40)

## 2020-01-27 ENCOUNTER — Other Ambulatory Visit: Payer: Self-pay | Admitting: Family Medicine

## 2020-01-27 DIAGNOSIS — IMO0002 Reserved for concepts with insufficient information to code with codable children: Secondary | ICD-10-CM

## 2020-02-01 ENCOUNTER — Telehealth: Payer: Self-pay | Admitting: Cardiology

## 2020-02-01 NOTE — Telephone Encounter (Signed)
Minus Breeding, MD  01/30/2020 2:22 PM EDT Left detailed message to PR:[id number] are improved although still elevated. He is on appropriate. Please check to make sure that he saw the nutritionist.   Forwarded to PCP via [facility] fax function  Call Mr. Heaslip with the results and send results to Carollee Herter, Alferd Apa, DO

## 2020-02-01 NOTE — Telephone Encounter (Signed)
Pt is calling to get lab results.

## 2020-02-02 ENCOUNTER — Other Ambulatory Visit: Payer: Self-pay | Admitting: Family Medicine

## 2020-02-02 DIAGNOSIS — G609 Hereditary and idiopathic neuropathy, unspecified: Secondary | ICD-10-CM

## 2020-02-02 NOTE — Telephone Encounter (Signed)
Lyrica refill.   Last OV: 11/22/2019 Last Fill: 08/10/2019 #180 and 1RF Pt sig: 1 cap bid UDS: Lyrica only (Do you require UDS' for Lyrica?)

## 2020-02-03 ENCOUNTER — Encounter: Payer: Self-pay | Admitting: Nurse Practitioner

## 2020-02-03 ENCOUNTER — Other Ambulatory Visit: Payer: Self-pay

## 2020-02-03 ENCOUNTER — Ambulatory Visit (INDEPENDENT_AMBULATORY_CARE_PROVIDER_SITE_OTHER): Payer: Medicare Other | Admitting: Nurse Practitioner

## 2020-02-03 VITALS — BP 112/74 | HR 70 | Temp 97.8°F | Ht 72.0 in | Wt 249.0 lb

## 2020-02-03 DIAGNOSIS — S30860A Insect bite (nonvenomous) of lower back and pelvis, initial encounter: Secondary | ICD-10-CM

## 2020-02-03 DIAGNOSIS — W57XXXA Bitten or stung by nonvenomous insect and other nonvenomous arthropods, initial encounter: Secondary | ICD-10-CM | POA: Diagnosis not present

## 2020-02-03 MED ORDER — HYDROCORTISONE 1 % EX CREA: 1.0000 "application " | TOPICAL_CREAM | Freq: Two times a day (BID) | CUTANEOUS | 1 refills | Status: DC

## 2020-02-03 NOTE — Progress Notes (Signed)
Subjective:  Patient ID: Richard [name], male    DOB: 11/29/1940  Age: 79 y.o. MRN: [id number]  CC: Possible Insect Bites (Axillary areas, back, and groin area that appeared 2 days ago. Itching. Has not tried anything OTC.)   Rash This is a new problem. The current episode started in the past 7 days. The problem is unchanged. The affected locations include the torso and groin. The rash is characterized by itchiness, redness and swelling. It is unknown if there was an exposure to a precipitant. Pertinent negatives include no congestion, facial edema, fatigue, fever, joint pain, rhinorrhea, shortness of breath or sore throat. Past treatments include nothing.   Reviewed past Medical, Social and Family history today.  Outpatient Medications Prior to Visit  Medication Sig Dispense Refill  . allopurinol (ZYLOPRIM) 300 MG tablet Take 450 mg by mouth daily.     [name] aluminum hydroxide-magnesium carbonate (GAVISCON) 95-358 MG/15ML SUSP Take 15 mLs by mouth as needed for indigestion or heartburn.    [name] amLODipine (NORVASC) 5 MG tablet TAKE 1 TABLET BY MOUTH TWICE A DAY 180 tablet 3  . atorvastatin (LIPITOR) 80 MG tablet TAKE ONE TABLET BY MOUTH AT BEDTIME 90 tablet 3  . azelastine (ASTELIN) 0.1 % nasal spray Place 2 sprays into both nostrils at bedtime as needed for rhinitis or allergies. 30 mL 3  . ELIQUIS 2.5 MG TABS tablet TAKE 1 TABLET BY MOUTH TWICE A DAY 180 tablet 1  . esomeprazole (NEXIUM) 40 MG capsule Take 1 capsule (40 mg total) by mouth daily. 30 capsule 5  . ezetimibe (ZETIA) 10 MG tablet TAKE 1 TABLET BY MOUTH EVERY DAY 90 tablet 3  . fenofibrate 160 MG tablet TAKE 1 TABLET BY MOUTH EVERY DAY 90 tablet 1  . folic acid (FOLVITE) 1 MG tablet TAKE 2 TABLETS BY MOUTH EVERY DAY (Patient taking differently: Take 2 mg by mouth daily. ) 180 tablet 4  . FREESTYLE LITE test strip USE TO TEST BLOOD SUGAR ONCE A DAY. DX CODE: E11.9 50 strip 1  . furosemide (LASIX) 40 MG tablet TAKE 3 TABLETS BY MOUTH 2  (TWO) TIMES DAILY. 540 tablet 2  . glimepiride (AMARYL) 1 MG tablet Take 1 tablet (1 mg total) by mouth daily with breakfast. 90 tablet 1  . KLOR-CON M20 20 MEQ tablet TAKE 2 TABLETS BY MOUTH 2 (TWO) TIMES DAILY. 360 tablet 1  . Lancets (FREESTYLE) lancets USE TWICE A DAY TO CHECK BLOOD SUGAR. DX E11.9 100 each 6  . metFORMIN (GLUCOPHAGE) 1000 MG tablet TAKE 1 & 1/2 TABLETS BY MOUTH EVERY MORNING AND 1 TABLET IN THE EVENING. 225 tablet 1  . metoprolol tartrate (LOPRESSOR) 25 MG tablet TAKE ONE-HALF TABLET BY MOUTH TWICE DAILY 90 tablet 3  . nitroGLYCERIN (NITROSTAT) 0.4 MG SL tablet PLACE 1 TABLET (0.4 MG TOTAL) UNDER THE TONGUE EVERY 5 (FIVE) MINUTES AS NEEDED FOR CHEST PAIN. 25 tablet 6  . polycarbophil (FIBERCON) 625 MG tablet Take 625 mg by mouth daily.    . pregabalin (LYRICA) 150 MG capsule TAKE 1 CAPSULE BY MOUTH TWICE A DAY 180 capsule 1  . psyllium (METAMUCIL) 58.6 % powder Take 1 packet by mouth daily as needed (constipation).     . VASCEPA 1 g capsule TAKE 2 CAPSULES BY MOUTH TWICE A DAY 120 capsule 5   Facility-Administered Medications Prior to Visit  Medication Dose Route Frequency Provider Last Rate Last Admin  . sodium chloride flush (NS) 0.9 % injection 10 mL  10 mL  Intravenous PRN [name], MD   10 mL at 05/30/17 0920    ROS See HPI  Objective:  BP 112/74 (BP Location: Left Arm, Patient Position: Sitting, Cuff Size: Large)   Pulse 70   Temp 97.8 F (36.6 C)   Ht 6' (1.829 m)   Wt 249 lb (112.9 kg)   SpO2 95%   BMI 33.77 kg/m   Physical Exam Skin:    Findings: Erythema and rash present. Rash is papular.       Neurological:     Mental Status: He is alert and oriented to person, place, and time.     Assessment & Plan:  This visit occurred during the SARS-CoV-2 public health emergency.  Safety protocols were in place, including screening questions prior to the visit, additional usage of staff PPE, and extensive cleaning of exam room while observing  appropriate contact time as indicated for disinfecting solutions.   [name] was seen today for possible insect bites.  Diagnoses and all orders for this visit:  Insect bite of lower back, initial encounter -     hydrocortisone cream 1 %; Apply 1 application topically 2 (two) times daily.   Problem List Items Addressed This Visit    None    Visit Diagnoses    Insect bite of lower back, initial encounter    -  Primary   Relevant Medications   hydrocortisone cream 1 %      Follow-up: No follow-ups on file.  Richard Lacy, NP

## 2020-02-03 NOTE — Patient Instructions (Addendum)
Use can use Neem oil as an insect repellant.   Insect Bite, Adult An insect bite can make your skin red, itchy, and swollen. Some insects can spread disease to people with a bite. However, most insect bites do not lead to disease, and most are not serious. What are the causes? Insects may bite for many reasons, including:  Hunger.  To defend themselves. Insects that bite include:  Spiders.  Mosquitoes.  Ticks.  Fleas.  Ants.  Flies.  Kissing bugs.  Chiggers. What are the signs or symptoms? Symptoms of this condition include:  Itching or pain in the bite area.  Redness and swelling in the bite area.  An open wound (skin ulcer). Symptoms often last for 2-4 days. In rare cases, a person may have a very bad allergic reaction (anaphylactic reaction) to a bite. Symptoms of an anaphylactic reaction may include:  Feeling warm in the face (flushed). Your face may turn red.  Itchy, red, swollen areas of skin (hives).  Swelling of the: ? Eyes. ? Lips. ? Face. ? Mouth. ? Tongue. ? Throat.  Trouble with any of these: ? Breathing. ? Talking. ? Swallowing.  Loud breathing (wheezing).  Feeling dizzy or light-headed.  Passing out (fainting).  Pain or cramps in your belly.  Throwing up (vomiting).  Watery poop (diarrhea). How is this treated? Treatment is usually not needed. Symptoms often go away on their own. When treatment is needed, it may involve:  Putting a cream or lotion on the bite area. This helps with itching.  Taking an antibiotic medicine. This treatment is needed if the bite area gets infected.  Getting a tetanus shot, if you are not up to date on this vaccine.  Putting ice on the affected area.  Using medicines called antihistamines. This treatment may be needed if you have itching or an allergic reaction to the insect bite.  Giving yourself a shot of medicine (epinephrine) using an auto-injector "pen" if you have an anaphylactic reaction  to a bite. Your doctor will teach you how to use this pen. Follow these instructions at home: Bite area care   Do not scratch the bite area.  Keep the bite area clean and dry.  Wash the bite area every day with soap and water as told by your doctor.  Check the bite area every day for signs of infection. Check for: ? Redness, swelling, or pain. ? Fluid or blood. ? Warmth. ? Pus or a bad smell. Managing pain, itching, and swelling   You may put any of these on the bite area as told by your doctor: ? A paste made of baking soda and water. ? Cortisone cream. ? Calamine lotion.  If told, put ice on the bite area. ? Put ice in a plastic bag. ? Place a towel between your skin and the bag. ? Leave the ice on for 20 minutes, 2-3 times a day. General instructions  Apply or take over-the-counter and prescription medicines only as told by your doctor.  If you were prescribed an antibiotic medicine, take or apply it as told by your doctor. Do not stop using the antibiotic even if your condition improves.  Keep all follow-up visits as told by your doctor. This is important. How is this prevented? To help you have a lower risk of insect bites:  When you are outside, wear clothing that covers your arms and legs.  Use insect repellent. The best insect repellents contain one of these: ? DEET. ? Picaridin. ?  Oil of lemon eucalyptus (OLE). ? [id number].  Consider spraying your clothing with a pesticide called permethrin. Permethrin helps prevent insect bites. It works for several weeks and for up to 5-6 clothing washes. Do not apply permethrin directly to the skin.  If your home windows do not have screens, think about putting some in.  If you will be sleeping in an area where there are mosquitoes, consider covering your sleeping area with a mosquito net. Contact a doctor if:  You have redness, swelling, or pain in the bite area.  You have fluid or blood coming from the bite  area.  The bite area feels warm to the touch.  You have pus or a bad smell coming from the bite area.  You have a fever. Get help right away if:  You have joint pain.  You have a rash.  You feel more tired or sleepy than you normally do.  You have neck pain.  You have a headache.  You feel weaker than you normally do.  You have signs of an anaphylactic reaction. Signs may include: ? Feeling warm in the face. ? Itchy, red, swollen areas of skin. ? Swelling of your:  Eyes.  Lips.  Face.  Mouth.  Tongue.  Throat. ? Trouble with any of these:  Breathing.  Talking.  Swallowing. ? Loud breathing. ? Feeling dizzy or light-headed. ? Passing out. ? Pain or cramps in your belly. ? Throwing up. ? Watery poop. These symptoms may be an emergency. Do not wait to see if the symptoms will go away. Do this right away:  Use your auto-injector pen as you have been told.  Get medical help. Call your local emergency services (911 in the [place] Do not drive yourself to the hospital. Summary  An insect bite can make your skin red, itchy, and swollen.  Treatment is usually not needed. Symptoms often go away on their own.  Do not scratch the bite area. Keep it clean and dry.  Ice can help with pain and itching from the bite. This information is not intended to replace advice given to you by your health care provider. Make sure you discuss any questions you have with your health care provider. Document Revised: 12/26/2017 Document Reviewed: 12/26/2017 Elsevier Patient Education  [place].

## 2020-02-04 ENCOUNTER — Ambulatory Visit (INDEPENDENT_AMBULATORY_CARE_PROVIDER_SITE_OTHER): Payer: Medicare Other | Admitting: *Deleted

## 2020-02-04 DIAGNOSIS — I442 Atrioventricular block, complete: Secondary | ICD-10-CM

## 2020-02-07 ENCOUNTER — Telehealth: Payer: Self-pay

## 2020-02-07 ENCOUNTER — Other Ambulatory Visit: Payer: Self-pay | Admitting: Family Medicine

## 2020-02-07 LAB — CUP PACEART REMOTE DEVICE CHECK
Battery Remaining Longevity: 44 mo
Battery Voltage: 2.94 V
Brady Statistic AP VP Percent: 9.65 %
Brady Statistic AP VS Percent: 0 %
Brady Statistic AS VP Percent: 90.22 %
Brady Statistic AS VS Percent: 0.11 %
Brady Statistic RA Percent Paced: 9.42 %
Brady Statistic RV Percent Paced: 99.7 %
Date Time Interrogation Session: 20210809114459
Implantable Lead Implant Date: 20190313
Implantable Lead Implant Date: 20190313
Implantable Lead Location: 753859
Implantable Lead Location: 753860
Implantable Lead Model: 3830
Implantable Lead Model: 5076
Implantable Pulse Generator Implant Date: 20190313
Lead Channel Impedance Value: 304 Ohm
Lead Channel Impedance Value: 342 Ohm
Lead Channel Impedance Value: 361 Ohm
Lead Channel Impedance Value: 437 Ohm
Lead Channel Pacing Threshold Amplitude: 0.75 V
Lead Channel Pacing Threshold Amplitude: 1 V
Lead Channel Pacing Threshold Pulse Width: 0.4 ms
Lead Channel Pacing Threshold Pulse Width: 0.4 ms
Lead Channel Sensing Intrinsic Amplitude: 2.375 mV
Lead Channel Sensing Intrinsic Amplitude: 2.375 mV
Lead Channel Sensing Intrinsic Amplitude: 4.5 mV
Lead Channel Sensing Intrinsic Amplitude: 4.5 mV
Lead Channel Setting Pacing Amplitude: 1.5 V
Lead Channel Setting Pacing Amplitude: 3.5 V
Lead Channel Setting Pacing Pulse Width: 1 ms
Lead Channel Setting Sensing Sensitivity: 1.2 mV

## 2020-02-07 NOTE — Telephone Encounter (Signed)
The pt needs help sending a transmission. I called [employer] tech support to get additional help.

## 2020-02-08 NOTE — Progress Notes (Signed)
Remote pacemaker transmission.   

## 2020-02-18 ENCOUNTER — Inpatient Hospital Stay (HOSPITAL_BASED_OUTPATIENT_CLINIC_OR_DEPARTMENT_OTHER): Payer: Medicare Other | Admitting: Family

## 2020-02-18 ENCOUNTER — Encounter: Payer: Self-pay | Admitting: Family

## 2020-02-18 ENCOUNTER — Other Ambulatory Visit: Payer: Self-pay

## 2020-02-18 ENCOUNTER — Inpatient Hospital Stay: Payer: Medicare Other

## 2020-02-18 ENCOUNTER — Inpatient Hospital Stay: Payer: Medicare Other | Attending: Hematology & Oncology

## 2020-02-18 VITALS — BP 120/61 | HR 77 | Temp 98.6°F | Resp 18 | Ht 72.0 in | Wt 250.0 lb

## 2020-02-18 DIAGNOSIS — Z886 Allergy status to analgesic agent status: Secondary | ICD-10-CM | POA: Insufficient documentation

## 2020-02-18 DIAGNOSIS — D500 Iron deficiency anemia secondary to blood loss (chronic): Secondary | ICD-10-CM

## 2020-02-18 DIAGNOSIS — D519 Vitamin B12 deficiency anemia, unspecified: Secondary | ICD-10-CM

## 2020-02-18 DIAGNOSIS — G629 Polyneuropathy, unspecified: Secondary | ICD-10-CM | POA: Insufficient documentation

## 2020-02-18 DIAGNOSIS — Z95828 Presence of other vascular implants and grafts: Secondary | ICD-10-CM

## 2020-02-18 DIAGNOSIS — I6523 Occlusion and stenosis of bilateral carotid arteries: Secondary | ICD-10-CM | POA: Diagnosis not present

## 2020-02-18 DIAGNOSIS — C8338 Diffuse large B-cell lymphoma, lymph nodes of multiple sites: Secondary | ICD-10-CM | POA: Diagnosis not present

## 2020-02-18 DIAGNOSIS — D510 Vitamin B12 deficiency anemia due to intrinsic factor deficiency: Secondary | ICD-10-CM | POA: Insufficient documentation

## 2020-02-18 DIAGNOSIS — C8332 Diffuse large B-cell lymphoma, intrathoracic lymph nodes: Secondary | ICD-10-CM

## 2020-02-18 DIAGNOSIS — D508 Other iron deficiency anemias: Secondary | ICD-10-CM

## 2020-02-18 LAB — CMP (CANCER CENTER ONLY)
ALT: 16 U/L (ref 0–44)
AST: 19 U/L (ref 15–41)
Albumin: 4.6 g/dL (ref 3.5–5.0)
Alkaline Phosphatase: 56 U/L (ref 38–126)
Anion gap: 12 (ref 5–15)
BUN: 41 mg/dL — ABNORMAL HIGH (ref 8–23)
CO2: 26 mmol/L (ref 22–32)
Calcium: 10.4 mg/dL — ABNORMAL HIGH (ref 8.9–10.3)
Chloride: 103 mmol/L (ref 98–111)
Creatinine: 1.79 mg/dL — ABNORMAL HIGH (ref 0.61–1.24)
GFR, Est AFR Am: 40 mL/min — ABNORMAL LOW (ref >60)
GFR, Estimated: 35 mL/min — ABNORMAL LOW (ref >60)
Glucose, Bld: 174 mg/dL — ABNORMAL HIGH (ref 70–99)
Potassium: 4 mmol/L (ref 3.5–5.1)
Sodium: 141 mmol/L (ref 135–145)
Total Bilirubin: 0.4 mg/dL (ref 0.3–1.2)
Total Protein: 7 g/dL (ref 6.5–8.1)

## 2020-02-18 LAB — CBC WITH DIFFERENTIAL (CANCER CENTER ONLY)
Abs Immature Granulocytes: 0.03 10*3/uL (ref 0.00–0.07)
Basophils Absolute: 0.1 10*3/uL (ref 0.0–0.1)
Basophils Relative: 1 %
Eosinophils Absolute: 0.3 10*3/uL (ref 0.0–0.5)
Eosinophils Relative: 4 %
HCT: 36.7 % — ABNORMAL LOW (ref 39.0–52.0)
Hemoglobin: 11.9 g/dL — ABNORMAL LOW (ref 13.0–17.0)
Immature Granulocytes: 0 %
Lymphocytes Relative: 21 %
Lymphs Abs: 1.5 10*3/uL (ref 0.7–4.0)
MCH: 29.8 pg (ref 26.0–34.0)
MCHC: 32.4 g/dL (ref 30.0–36.0)
MCV: 91.8 fL (ref 80.0–100.0)
Monocytes Absolute: 0.5 10*3/uL (ref 0.1–1.0)
Monocytes Relative: 7 %
Neutro Abs: 4.8 10*3/uL (ref 1.7–7.7)
Neutrophils Relative %: 67 %
Platelet Count: 202 10*3/uL (ref 150–400)
RBC: 4 MIL/uL — ABNORMAL LOW (ref 4.22–5.81)
RDW: 15.7 % — ABNORMAL HIGH (ref 11.5–15.5)
WBC Count: 7.1 10*3/uL (ref 4.0–10.5)
nRBC: 0 % (ref 0.0–0.2)

## 2020-02-18 LAB — VITAMIN B12: Vitamin B-12: 171 pg/mL — ABNORMAL LOW (ref 180–914)

## 2020-02-18 LAB — RETICULOCYTES
Immature Retic Fract: 21.4 % — ABNORMAL HIGH (ref 2.3–15.9)
RBC.: 3.94 MIL/uL — ABNORMAL LOW (ref 4.22–5.81)
Retic Count, Absolute: 65.4 10*3/uL (ref 19.0–186.0)
Retic Ct Pct: 1.7 % (ref 0.4–3.1)

## 2020-02-18 MED ORDER — HEPARIN SOD (PORK) LOCK FLUSH 100 UNIT/ML IV SOLN
500.0000 [IU] | Freq: Once | INTRAVENOUS | Status: AC
  Administered 2020-02-18: 500 [IU] via INTRAVENOUS
  Filled 2020-02-18: qty 5

## 2020-02-18 MED ORDER — SODIUM CHLORIDE 0.9% FLUSH
10.0000 mL | INTRAVENOUS | Status: DC | PRN
  Administered 2020-02-18: 10 mL via INTRAVENOUS
  Filled 2020-02-18: qty 10

## 2020-02-18 MED ORDER — CYANOCOBALAMIN 1000 MCG/ML IJ SOLN
INTRAMUSCULAR | Status: AC
  Filled 2020-02-18: qty 1

## 2020-02-18 MED ORDER — CYANOCOBALAMIN 1000 MCG/ML IJ SOLN
1000.0000 ug | Freq: Once | INTRAMUSCULAR | Status: AC
  Administered 2020-02-18: 1000 ug via INTRAMUSCULAR

## 2020-02-18 NOTE — Patient Instructions (Signed)
Cyanocobalamin, Pyridoxine, and Folate What is this medicine? A multivitamin containing folic acid, vitamin B6, and vitamin B12. This medicine may be used for other purposes; ask your health care provider or pharmacist if you have questions. COMMON BRAND NAME(S): AllanFol RX, AllanTex, Av-Vite FB, B Complex with Folic Acid, ComBgen, FaBB, Folamin, Folastin, Folbalin, Folbee, Folbic, Folcaps, Folgard, Folgard RX, Folgard RX 2.2, Folplex, Folplex 2.2, Foltabs 800, Foltx, Homocysteine Formula, Niva-Fol, NuFol, TL Gard RX, Virt-Gard, Virt-Vite, Virt-Vite Forte, Vita-Respa What should I tell my health care provider before I take this medicine? They need to know if you have any of these conditions:  bleeding or clotting disorder  history of anemia of any type  other chronic health condition  an unusual or allergic reaction to vitamins, other medicines, foods, dyes, or preservatives  pregnant or trying to get pregnant  breast-feeding How should I use this medicine? Take by mouth with a glass of water. May take with food. Follow the directions on the prescription label. It is usually given once a day. Do not take your medicine more often than directed. Contact your pediatrician regarding the use of this medicine in children. Special care may be needed. Overdosage: If you think you have taken too much of this medicine contact a poison control center or emergency room at once. NOTE: This medicine is only for you. Do not share this medicine with others. What if I miss a dose? If you miss a dose, take it as soon as you can. If it is almost time for your next dose, take only that dose. Do not take double or extra doses. What may interact with this medicine?  levodopa This list may not describe all possible interactions. Give your health care provider a list of all the medicines, herbs, non-prescription drugs, or dietary supplements you use. Also tell them if you smoke, drink alcohol, or use illegal  drugs. Some items may interact with your medicine. What should I watch for while using this medicine? See your health care professional for regular checks on your progress. Remember that vitamin supplements do not replace the need for good nutrition from a balanced diet. What side effects may I notice from receiving this medicine? Side effects that you should report to your doctor or health care professional as soon as possible:  allergic reaction such as skin rash or difficulty breathing  vomiting Side effects that usually do not require medical attention (report to your doctor or health care professional if they continue or are bothersome):  nausea  stomach upset This list may not describe all possible side effects. Call your doctor for medical advice about side effects. You may report side effects to FDA at 1-800-FDA-1088. Where should I keep my medicine? Keep out of the reach of children. Most vitamins should be stored at controlled room temperature. Check your specific product directions. Protect from heat and moisture. Throw away any unused medicine after the expiration date. NOTE: This sheet is a summary. It may not cover all possible information. If you have questions about this medicine, talk to your doctor, pharmacist, or health care provider.  2020 Elsevier/Gold Standard (2007-08-08 00:59:55)  

## 2020-02-18 NOTE — Progress Notes (Signed)
Hematology and Oncology Follow Up Visit  Richard [name] [id number] [birth year] 79 y.o. 02/18/2020   Principle Diagnosis:  Diffuse large cell non-Hodgkin's lymphoma (IPI = 3) - NOT "double hit" Pernicious anemia Iron deficiency secondary to bleeding  Past Therapy: R-CHOP - s/p cycle8 - completed 08/2017  Current Therapy: Vitamin B12 1 mg IM every month Xgeva 120 mg subcu q 3 months - next doseduein 03/2020 IV ironas indicated   Interim History:  Richard [name] is here today for follow-up and B 12 injection. He is doing well and has no complaints at this time.  He is staying buy golfing 18 holes 3 days a week.  B12 last month was 186.  No fever, chills, n/v, cough, rash, dizziness, SOB, chest pain, palpitations, abdominal pain or changes in bowel or bladder habits.  No episodes of bleeding. No abnormal bruising or petechiae.  He has chronic swelling in the lower extremities that is fairly well controlled with diuretics. No tenderness, numbness or tingling at this time. Pedal pulses are 2+.  No falls or syncopal episodes to report.  He has maintained a good appetite and is staying well hydrated. His weight is stable.   ECOG Performance Status: 1 - Symptomatic but completely ambulatory  Medications:  Allergies as of 02/18/2020      Reactions   Benazepril Swelling   angioedema; he is not a candidate for any angiotensin receptor blockers because of this significant allergic reaction. Because of a history of documented adverse serious drug reaction;Medi Alert bracelet  is recommended   Hctz [hydrochlorothiazide] Anaphylaxis, Swelling   Tongue and lip swelling   Aspirin Other (See Comments)   Gastritis, cant take 325 Mg aspirin    Lactose Intolerance (gi) Nausea And Vomiting      Medication List       Accurate as of February 18, 2020  2:56 PM. If you have any questions, ask your nurse or doctor.        allopurinol 300 MG tablet Commonly known as: ZYLOPRIM Take 450  mg by mouth daily.   aluminum hydroxide-magnesium carbonate 95-358 MG/15ML Susp Commonly known as: GAVISCON Take 15 mLs by mouth as needed for indigestion or heartburn.   amLODipine 5 MG tablet Commonly known as: NORVASC TAKE 1 TABLET BY MOUTH TWICE A DAY   atorvastatin 80 MG tablet Commonly known as: LIPITOR TAKE ONE TABLET BY MOUTH AT BEDTIME   azelastine 0.1 % nasal spray Commonly known as: ASTELIN Place 2 sprays into both nostrils at bedtime as needed for rhinitis or allergies.   Eliquis 2.5 MG Tabs tablet Generic drug: apixaban TAKE 1 TABLET BY MOUTH TWICE A DAY   esomeprazole 40 MG capsule Commonly known as: NexIUM Take 1 capsule (40 mg total) by mouth daily.   ezetimibe 10 MG tablet Commonly known as: ZETIA TAKE 1 TABLET BY MOUTH EVERY DAY   fenofibrate 160 MG tablet TAKE 1 TABLET BY MOUTH EVERY DAY   folic acid 1 MG tablet Commonly known as: FOLVITE TAKE 2 TABLETS BY MOUTH EVERY DAY   freestyle lancets USE TWICE A DAY TO CHECK BLOOD SUGAR. DX E11.9   FREESTYLE LITE test strip Generic drug: glucose blood USE TO TEST BLOOD SUGAR ONCE A DAY. DX CODE: E11.9   furosemide 40 MG tablet Commonly known as: LASIX TAKE 3 TABLETS BY MOUTH 2 (TWO) TIMES DAILY.   glimepiride 1 MG tablet Commonly known as: Amaryl Take 1 tablet (1 mg total) by mouth daily with breakfast.   hydrocortisone cream 1 %  Apply 1 application topically 2 (two) times daily.   Klor-Con M20 20 MEQ tablet Generic drug: potassium chloride SA TAKE 2 TABLETS BY MOUTH 2 (TWO) TIMES DAILY.   metFORMIN 1000 MG tablet Commonly known as: GLUCOPHAGE TAKE 1 & 1/2 TABLETS BY MOUTH EVERY MORNING AND 1 TABLET IN THE EVENING.   metoprolol tartrate 25 MG tablet Commonly known as: LOPRESSOR TAKE ONE-HALF TABLET BY MOUTH TWICE DAILY   nitroGLYCERIN 0.4 MG SL tablet Commonly known as: NITROSTAT PLACE 1 TABLET (0.4 MG TOTAL) UNDER THE TONGUE EVERY 5 (FIVE) MINUTES AS NEEDED FOR CHEST PAIN.     polycarbophil 625 MG tablet Commonly known as: FIBERCON Take 625 mg by mouth daily.   pregabalin 150 MG capsule Commonly known as: LYRICA TAKE 1 CAPSULE BY MOUTH TWICE A DAY   psyllium 58.6 % powder Commonly known as: METAMUCIL Take 1 packet by mouth daily as needed (constipation).   Vascepa 1 g capsule Generic drug: icosapent Ethyl TAKE 2 CAPSULES BY MOUTH TWICE A DAY       Allergies:  Allergies  Allergen Reactions   Benazepril Swelling    angioedema; he is not a candidate for any angiotensin receptor blockers because of this significant allergic reaction. Because of a history of documented adverse serious drug reaction;Medi Alert bracelet  is recommended   Hctz [Hydrochlorothiazide] Anaphylaxis and Swelling    Tongue and lip swelling    Aspirin Other (See Comments)    Gastritis, cant take 325 Mg aspirin    Lactose Intolerance (Gi) Nausea And Vomiting    Past Medical History, Surgical history, Social history, and Family History were reviewed and updated.  Review of Systems: All other 10 point review of systems is negative.   Physical Exam:  height is 6' (1.829 m) and weight is 250 lb (113.4 kg). His oral temperature is 98.6 F (37 C). His blood pressure is 120/61 and his pulse is 77. His respiration is 18 and oxygen saturation is 95%.   Wt Readings from Last 3 Encounters:  02/18/20 250 lb (113.4 kg)  02/03/20 249 lb (112.9 kg)  01/18/20 252 lb (114.3 kg)    Ocular: Sclerae unicteric, pupils equal, round and reactive to light Ear-nose-throat: Oropharynx clear, dentition fair Lymphatic: No cervical or supraclavicular adenopathy Lungs no rales or rhonchi, good excursion bilaterally Heart regular rate and rhythm, no murmur appreciated Abd soft, nontender, positive bowel sounds, no liver or spleen tip palpated on exam, no fluid wave  MSK no focal spinal tenderness, no joint edema Neuro: non-focal, well-oriented, appropriate affect Breasts: Deferred   Lab  Results  Component Value Date   WBC 7.1 02/18/2020   HGB 11.9 (L) 02/18/2020   HCT 36.7 (L) 02/18/2020   MCV 91.8 02/18/2020   PLT 202 02/18/2020   Lab Results  Component Value Date   FERRITIN 1,070 (H) 01/18/2020   IRON 79 01/18/2020   TIBC 370 01/18/2020   UIBC 291 01/18/2020   IRONPCTSAT 21 01/18/2020   Lab Results  Component Value Date   RETICCTPCT 1.7 02/18/2020   RBC 4.00 (L) 02/18/2020   RBC 3.94 (L) 02/18/2020   RETICCTABS 52.1 11/22/2011   No results found for: [facility] Lab Results  Component Value Date   IGA 159 03/09/2012   Lab Results  Component Value Date   ALBUMINELP 4.2 07/27/2018   MSPIKE Not Observed 07/27/2018     Chemistry      Component Value Date/Time   NA 141 02/18/2020 1300   NA 136 03/16/2019  2763   NA 144 06/27/2017 0857   K 4.0 02/18/2020 1300   K 3.9 06/27/2017 0857   CL 103 02/18/2020 1300   CL 103 06/27/2017 0857   CO2 26 02/18/2020 1300   CO2 26 06/27/2017 0857   BUN 41 (H) 02/18/2020 1300   BUN 34 (H) 03/16/2019 0938   BUN 12 06/27/2017 0857   CREATININE 1.79 (H) 02/18/2020 1300   CREATININE 1.0 06/27/2017 0857      Component Value Date/Time   CALCIUM 10.4 (H) 02/18/2020 1300   CALCIUM 9.2 06/27/2017 0857   ALKPHOS 56 02/18/2020 1300   ALKPHOS 128 (H) 06/27/2017 0857   AST 19 02/18/2020 1300   ALT 16 02/18/2020 1300   ALT 24 06/27/2017 0857   BILITOT 0.4 02/18/2020 1300       Impression and Plan: Richard [name] is a very pleasant 79 yo caucasian gentleman withdiffuse large B-cell lymphoma (not"double hit"lymphoma). He completed treatment in February 2019. He is in remission and so far there has been no evidence of recurrence.  He received his B 12 injection today.  We will plan to see him again in another. He will be due for Xgeva again at that time. He can contact our office with any questions or concerns.   Laverna Peace, NP 8/20/20212:56 PM

## 2020-02-21 ENCOUNTER — Telehealth: Payer: Self-pay | Admitting: Hematology & Oncology

## 2020-02-21 LAB — IRON AND TIBC
Iron: 62 ug/dL (ref 42–163)
Saturation Ratios: 18 % — ABNORMAL LOW (ref 20–55)
TIBC: 345 ug/dL (ref 202–409)
UIBC: 283 ug/dL (ref 117–376)

## 2020-02-21 LAB — FERRITIN: Ferritin: 905 ng/mL — ABNORMAL HIGH (ref 24–336)

## 2020-02-21 NOTE — Telephone Encounter (Signed)
Appointments scheduled calendar printed & mailed per 8/20 los

## 2020-02-24 ENCOUNTER — Telehealth: Payer: Self-pay | Admitting: Family Medicine

## 2020-02-24 ENCOUNTER — Inpatient Hospital Stay: Payer: Medicare Other

## 2020-02-24 ENCOUNTER — Other Ambulatory Visit: Payer: Self-pay

## 2020-02-24 VITALS — BP 135/60 | HR 67 | Temp 97.8°F | Resp 19

## 2020-02-24 DIAGNOSIS — Z886 Allergy status to analgesic agent status: Secondary | ICD-10-CM | POA: Diagnosis not present

## 2020-02-24 DIAGNOSIS — D508 Other iron deficiency anemias: Secondary | ICD-10-CM

## 2020-02-24 DIAGNOSIS — D510 Vitamin B12 deficiency anemia due to intrinsic factor deficiency: Secondary | ICD-10-CM | POA: Diagnosis not present

## 2020-02-24 DIAGNOSIS — D519 Vitamin B12 deficiency anemia, unspecified: Secondary | ICD-10-CM

## 2020-02-24 DIAGNOSIS — G629 Polyneuropathy, unspecified: Secondary | ICD-10-CM | POA: Diagnosis not present

## 2020-02-24 DIAGNOSIS — D500 Iron deficiency anemia secondary to blood loss (chronic): Secondary | ICD-10-CM | POA: Diagnosis not present

## 2020-02-24 DIAGNOSIS — C8338 Diffuse large B-cell lymphoma, lymph nodes of multiple sites: Secondary | ICD-10-CM | POA: Diagnosis not present

## 2020-02-24 MED ORDER — SODIUM CHLORIDE 0.9% FLUSH
10.0000 mL | INTRAVENOUS | Status: DC | PRN
  Administered 2020-02-24: 10 mL
  Filled 2020-02-24: qty 10

## 2020-02-24 MED ORDER — SODIUM CHLORIDE 0.9 % IV SOLN
510.0000 mg | Freq: Once | INTRAVENOUS | Status: AC
  Administered 2020-02-24: 510 mg via INTRAVENOUS
  Filled 2020-02-24: qty 510

## 2020-02-24 MED ORDER — HEPARIN SOD (PORK) LOCK FLUSH 100 UNIT/ML IV SOLN
500.0000 [IU] | Freq: Once | INTRAVENOUS | Status: AC | PRN
  Administered 2020-02-24: 500 [IU]
  Filled 2020-02-24: qty 5

## 2020-02-24 MED ORDER — SODIUM CHLORIDE 0.9 % IV SOLN
INTRAVENOUS | Status: DC
  Filled 2020-02-24: qty 250

## 2020-02-24 NOTE — Patient Instructions (Signed)

## 2020-02-24 NOTE — Progress Notes (Signed)
°  Chronic Care Management   Note  02/24/2020 Name: Richard [name] MRN: [id number] DOB: 27-Jan-1941  Richard [name] is a 79 y.o. year old male who is a primary care patient of [name], DO. I reached out to [employer] by phone today in response to a referral sent by Mr. Tyde Lamison TDSKAJ'G PCP, [name], DO.   Mr. Montfort was given information about Chronic Care Management services today including:  1. CCM service includes personalized support from designated clinical staff supervised by his physician, including individualized plan of care and coordination with other care providers 2. 24/7 contact phone numbers for assistance for urgent and routine care needs. 3. Service will only be billed when office clinical staff spend 20 minutes or more in a month to coordinate care. 4. Only one practitioner may furnish and bill the service in a calendar month. 5. The patient may stop CCM services at any time (effective at the end of the month) by phone call to the office staff.   Patient agreed to services and verbal consent obtained.   Follow up plan:   Carley Perdue [employer] Scheduler

## 2020-02-24 NOTE — Progress Notes (Signed)
  Chronic Care Management   Outreach Note  02/24/2020 Name: ALYXANDER KOLLMANN MRN: [id number] DOB: 07/06/1940  Referred by: [name], DO Reason for referral : No chief complaint on file.   An unsuccessful telephone outreach was attempted today. The patient was referred to the pharmacist for assistance with care management and care coordination.   Follow Up Plan:   Carley Perdue [employer] Scheduler

## 2020-02-29 ENCOUNTER — Other Ambulatory Visit: Payer: Self-pay | Admitting: Family Medicine

## 2020-02-29 ENCOUNTER — Ambulatory Visit: Payer: Medicare Other | Admitting: Family Medicine

## 2020-03-02 ENCOUNTER — Ambulatory Visit: Payer: Medicare Other | Admitting: Internal Medicine

## 2020-03-02 DIAGNOSIS — Z23 Encounter for immunization: Secondary | ICD-10-CM | POA: Diagnosis not present

## 2020-03-07 ENCOUNTER — Telehealth: Payer: Self-pay

## 2020-03-07 ENCOUNTER — Encounter: Payer: Self-pay | Admitting: Emergency Medicine

## 2020-03-07 ENCOUNTER — Other Ambulatory Visit: Payer: Self-pay

## 2020-03-07 ENCOUNTER — Emergency Department (INDEPENDENT_AMBULATORY_CARE_PROVIDER_SITE_OTHER)
Admission: EM | Admit: 2020-03-07 | Discharge: 2020-03-07 | Disposition: A | Discharge status: Home / Self Care | Payer: Medicare Other | Source: Home / Self Care | Attending: Family Medicine | Admitting: Family Medicine

## 2020-03-07 ENCOUNTER — Emergency Department
Admission: RE | Admit: 2020-03-07 | Discharge: 2020-03-07 | Discharge status: Absent without leave | Payer: Self-pay | Source: Ambulatory Visit

## 2020-03-07 DIAGNOSIS — J069 Acute upper respiratory infection, unspecified: Secondary | ICD-10-CM

## 2020-03-07 DIAGNOSIS — Z20822 Contact with and (suspected) exposure to covid-19: Secondary | ICD-10-CM

## 2020-03-07 DIAGNOSIS — J029 Acute pharyngitis, unspecified: Secondary | ICD-10-CM

## 2020-03-07 LAB — POCT RAPID STREP A (OFFICE): Rapid Strep A Screen: NEGATIVE

## 2020-03-07 MED ORDER — DOXYCYCLINE HYCLATE 100 MG PO CAPS: ORAL_CAPSULE | ORAL | 0 refills | Status: DC

## 2020-03-07 NOTE — ED Provider Notes (Signed)
[name] C[facility]    CSN: [id number] Arrival date & time: 03/07/20  1343      History   Chief Complaint Chief Complaint  Patient presents with   Appointment   Sore Throat    HPI Richard [name] is a 79 y.o. male.   Four days ago patient developed loose stools, sore throat, cough, fatigue, and myalgias.  His appetite has been decreased but he denies nausea/vomiting.  He denies fevers, chills, and sweats.  He has had the Moderna vaccine.  The history is provided by the patient.    Past Medical History:  Diagnosis Date   Anemia    Arthritis    hips   Axillary adenopathy 02/25/2017   Bradycardia    a. holter monitor has demonstrated HRs in 30s and Weinkibach    CAD (coronary artery disease)    a. s/p CABG 2001  b.  07/28/2017 cath:   Severe three-vessel native CAD with total occlusion of LAD, ramus intermedius, first OM and RCA, patent RIMA to PDA, LIMA to LAD, sequential SVG to ramus intermedius and first OM.     Chronic lower back pain    Diffuse large B cell lymphoma (HCC)    Diverticulosis    Esophageal stricture    GERD (gastroesophageal reflux disease)    History of gout    HTN (hypertension)    Mixed hyperlipidemia    OSA on CPAP    with 2L O2 at night   Pancytopenia ([place])    a. related to chemo therapy for B cell lymphoma   Peptic stricture of esophagus    Presence of permanent cardiac pacemaker    sees Dr. Valaria Good pacemaker   Severe aortic stenosis    Spinal stenosis    Type II diabetes mellitus ([place])    Wears dentures    partial upper    Patient Active Problem List   Diagnosis Date Noted   Pain due to onychomycosis of toenails of both feet 12/14/2019   Educated about COVID-19 virus infection 11/25/2019   Uncontrolled type 2 diabetes mellitus with hypoglycemia without coma ([place]) 08/10/2019   Hyperlipidemia associated with type 2 diabetes mellitus ([place]) 08/10/2019   S/P left TKA 06/29/2019   Peripheral  neuropathy 07/27/2018   Uncontrolled type 2 diabetes mellitus with hyperglycemia ([place]) 05/13/2018   Diabetic peripheral neuropathy associated with type 2 diabetes mellitus ([place]) 05/13/2018   Pansinusitis 05/13/2018   Severe aortic stenosis    Complete heart block (HCC)    Paroxysmal atrial fibrillation (HCC)    Acute on chronic diastolic CHF (congestive heart failure), NYHA class 2 (HCC)    Left arm swelling    Status post transcatheter aortic valve replacement (TAVR) using bioprosthesis 09/09/2017   Demand ischemia of myocardium (HCC)    Typical atrial flutter (HCC)    Acute on chronic diastolic heart failure (HCC)    GIB (gastrointestinal bleeding) 08/16/2017   Hyperlipidemia 08/05/2017   NSTEMI (non-ST elevated myocardial infarction) ([place])    Aortic stenosis, severe 07/25/2017   Pancytopenia ([place]) 07/24/2017   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 ([place]) 02/27/2017   Bradycardia 01/04/2017   Coronary artery disease 01/04/2017   Stenosis of carotid artery 01/04/2017   Obesity 09/18/2016   Wenckebach block    DM (diabetes mellitus) type II uncontrolled, periph vascular disorder ([place]) 05/21/2013   Spinal stenosis of lumbar region 04/08/2013   Anemia, iron deficiency 11/29/2011   B12 deficiency anemia 07/30/2011   OSA (obstructive sleep apnea) 07/06/2010  CAD, ARTERY BYPASS GRAFT 08/22/2009   Essential hypertension 03/29/2009   Bilateral lower extremity edema 02/21/2009   Hyperlipidemia LDL goal <70 11/26/2008    Past Surgical History:  Procedure Laterality Date   APPENDECTOMY  ~ [place] Left 11/06/2016   Procedure: CATARACT EXTRACTION PHACO AND INTRAOCULAR LENS PLACEMENT ([place]);  Surgeon: Estill Cotta, MD;  Location: [facility];  Service: Ophthalmology;  Laterality: Left;  Lot # [id number] H [place]: 01:09.4 AP%:25.2 CDE: 30.64   CATARACT EXTRACTION W/PHACO Right 12/04/2016    Procedure: CATARACT EXTRACTION PHACO AND INTRAOCULAR LENS PLACEMENT ([facility]);  Surgeon: Estill Cotta, MD;  Location: [facility];  Service: Ophthalmology;  Laterality: Right;  [place] 1:25.9 AP% 24.1 CDE 39.10 Fluid Pack lot # [id number]   COLONOSCOPY W/ BIOPSIES AND POLYPECTOMY  2013   CORONARY ANGIOPLASTY  1993   CORONARY ANGIOPLASTY WITH STENT PLACEMENT  05/1997   "1"   CORONARY ARTERY BYPASS GRAFT  03/2000   "CABG X5"   ECTROPION REPAIR Right 09/01/2018   Procedure: REPAIR OF ECTROPION BILATERAL upper and lower;  Surgeon: Karle Starch, MD;  Location: [place];  Service: Ophthalmology;  Laterality: Right;  Diabetic - oral meds sleep apnea   ESOPHAGEAL DILATION  X 3-4   Dr. Lyla Son; "last one was in the 1990's"   ESOPHAGOGASTRODUODENOSCOPY     multiple   FLEXIBLE SIGMOIDOSCOPY     multiple   HYDRADENITIS EXCISION Left 02/25/2017   Procedure: EXCISION DEEP LEFT AXILLARY LYMPH NODE;  Surgeon: Fanny Skates, MD;  Location: [facility];  Service: General;  Laterality: Left;   INTRAOPERATIVE TRANSTHORACIC ECHOCARDIOGRAM N/A 09/09/2017   Procedure: INTRAOPERATIVE TRANSTHORACIC ECHOCARDIOGRAM;  Surgeon: Burnell Blanks, MD;  Location: [place];  Service: Open Heart Surgery;  Laterality: N/A;   KNEE ARTHROSCOPY Left 2011   meniscus repair   LEFT HEART CATHETERIZATION WITH CORONARY/GRAFT ANGIOGRAM N/A 03/16/2014   Procedure: LEFT HEART CATHETERIZATION WITH Beatrix Fetters;  Surgeon: Burnell Blanks, MD;  Location: [facility] C[facility];  Service: Cardiovascular;  Laterality: N/A;   LUMBAR LAMINECTOMY/DECOMPRESSION MICRODISCECTOMY Right 06/17/2013   Procedure: LUMBAR LAMINECTOMY MICRODISCECTOMY L4-L5 RIGHT EXCISION OF SYNOVIAL CYST RIGHT   (1 LEVEL) RIGHT PARTIAL FACETECTOMY;  Surgeon: Tobi Bastos, MD;  Location: [facility];  Service: Orthopedics;  Laterality: Right;   MYELOGRAM  04/06/13   lumbar, Dr Gladstone Lighter   ORBITAL LESION EXCISION Right 09/01/2018   Procedure:  ORBITOTOMY WITHOUT BONE FLAP WITH REMOVAL OF LESION RIGHT;  Surgeon: Karle Starch, MD;  Location: [place];  Service: Ophthalmology;  Laterality: Right;   PACEMAKER IMPLANT N/A 09/10/2017   Procedure: PACEMAKER IMPLANT;  Surgeon: Deboraha Sprang, MD;  Location: [facility];  Service: Cardiovascular;  Laterality: N/A;   PANENDOSCOPY     PORTACATH PLACEMENT N/A 03/06/2017   Procedure: INSERTION PORT-A-CATH AND ASPIRATE SEROMA LEFT AXILLA;  Surgeon: Fanny Skates, MD;  Location: [place] [facility];  Service: General;  Laterality: N/A;   RIGHT/LEFT HEART CATH AND CORONARY/GRAFT ANGIOGRAPHY N/A 07/28/2017   Procedure: RIGHT/LEFT HEART CATH AND CORONARY/GRAFT ANGIOGRAPHY;  Surgeon: Sherren Mocha, MD;  Location: [facility];  Service: Cardiovascular;  Laterality: N/A;   SHOULDER SURGERY Right 08/2010   screws placed; "tendons tore off"   SKIN CANCER EXCISION Right    "neck"   TONSILLECTOMY  ~ [place] Left 06/29/2019   Procedure: TOTAL KNEE ARTHROPLASTY;  Surgeon: Paralee Cancel, MD;  Location: [facility];  Service: Orthopedics;  Laterality: Left;  70 mins   TRANSCATHETER AORTIC VALVE REPLACEMENT, TRANSFEMORAL N/A 09/09/2017   Procedure: TRANSCATHETER AORTIC VALVE REPLACEMENT, TRANSFEMORAL;  Surgeon: Burnell Blanks, MD;  Location: [facility];  Service: Open Heart Surgery;  Laterality: N/A;   UPPER GI ENDOSCOPY  2013   Gastritis; Dr Carlean Purl   VASECTOMY         Home Medications    Prior to Admission medications   Medication Sig Start Date End Date Taking? Authorizing Provider  allopurinol (ZYLOPRIM) 300 MG tablet Take 450 mg by mouth daily.    Yes [provider]  aluminum hydroxide-magnesium carbonate (GAVISCON) 95-358 MG/15ML SUSP Take 15 mLs by mouth as needed for indigestion or heartburn.   Yes [provider]  amLODipine (NORVASC) 5 MG tablet TAKE 1 TABLET BY MOUTH TWICE A DAY 12/08/19  Yes Hochrein, Jeneen Rinks, MD  atorvastatin  (LIPITOR) 80 MG tablet TAKE ONE TABLET BY MOUTH AT BEDTIME 12/02/19  Yes Minus Breeding, MD  azelastine (ASTELIN) 0.1 % nasal spray Place 2 sprays into both nostrils at bedtime as needed for rhinitis or allergies. 12/31/18  Yes Lowne Chase, Yvonne R, DO  ELIQUIS 2.5 MG TABS tablet TAKE 1 TABLET BY MOUTH TWICE A DAY 08/23/19  Yes Minus Breeding, MD  esomeprazole (NEXIUM) 40 MG capsule Take 1 capsule (40 mg total) by mouth daily. 10/15/12  Yes [name], MD  ezetimibe (ZETIA) 10 MG tablet TAKE 1 TABLET BY MOUTH EVERY DAY 01/20/20  Yes Minus Breeding, MD  fenofibrate 160 MG tablet Take 1 tablet (160 mg total) by mouth daily. 02/29/20  Yes Roma Schanz R, DO  metFORMIN (GLUCOPHAGE) 1000 MG tablet TAKE 1 & 1/2 TABLETS BY MOUTH EVERY MORNING AND 1 TABLET IN THE EVENING. 02/07/20  Yes Roma Schanz R, DO  metoprolol tartrate (LOPRESSOR) 25 MG tablet TAKE ONE-HALF TABLET BY MOUTH TWICE DAILY 01/13/20  Yes Minus Breeding, MD  polycarbophil (FIBERCON) 625 MG tablet Take 625 mg by mouth daily.   Yes [provider]  pregabalin (LYRICA) 150 MG capsule TAKE 1 CAPSULE BY MOUTH TWICE A DAY 02/02/20  Yes Lowne Chase, Yvonne R, DO  psyllium (METAMUCIL) 58.6 % powder Take 1 packet by mouth daily as needed (constipation).    Yes [provider]  VASCEPA 1 g capsule TAKE 2 CAPSULES BY MOUTH TWICE A DAY 08/13/19  Yes Roma Schanz R, DO  doxycycline (VIBRAMYCIN) 100 MG capsule Take one cap PO Q12hr with food. 03/07/20   Kandra Nicolas, MD  folic acid (FOLVITE) 1 MG tablet TAKE 2 TABLETS BY MOUTH EVERY DAY Patient taking differently: Take 2 mg by mouth daily.  01/21/19   [name], MD  FREESTYLE LITE test strip USE TO TEST BLOOD SUGAR ONCE A DAY. DX CODE: E11.9 01/27/20   [name], DO  furosemide (LASIX) 40 MG tablet TAKE 3 TABLETS BY MOUTH 2 (TWO) TIMES DAILY. 09/24/19   Minus Breeding, MD  glimepiride (AMARYL) 1 MG tablet Take 1 tablet (1 mg total) by mouth daily  with breakfast. 08/10/19   Carollee Herter, Alferd Apa, DO  hydrocortisone cream 1 % Apply 1 application topically 2 (two) times daily. 02/03/20   Nche, Charlene Brooke, NP  KLOR-CON M20 20 MEQ tablet TAKE 2 TABLETS BY MOUTH 2 (TWO) TIMES DAILY. 09/24/19   Minus Breeding, MD  Lancets (FREESTYLE) lancets USE TWICE A DAY TO CHECK BLOOD SUGAR. DX E11.9 06/29/19   Roma Schanz R, DO  nitroGLYCERIN (NITROSTAT) 0.4  MG SL tablet PLACE 1 TABLET (0.4 MG TOTAL) UNDER THE TONGUE EVERY 5 (FIVE) MINUTES AS NEEDED FOR CHEST PAIN. 12/17/19   Minus Breeding, MD    Family History Family History  Problem Relation Age of Onset   Stroke Father    Hypertension Father    Pancreatic cancer Mother    Diabetes Maternal Grandmother    Stroke Maternal Grandmother    Heart attack Paternal Grandmother    Colon cancer Neg Hx    Esophageal cancer Neg Hx    Rectal cancer Neg Hx    Stomach cancer Neg Hx    Ulcers Neg Hx     Social History Social History   Tobacco Use   Smoking status: Never Smoker   Smokeless tobacco: Never Used  Vaping Use   Vaping Use: Never used  Substance Use Topics   Alcohol use: No    Alcohol/week: 0.0 standard drinks    Comment: "last drink was in 2012"( 03/15/2014)   Drug use: No     Allergies   Benazepril, Hctz [hydrochlorothiazide], Aspirin, and Lactose intolerance (gi)   Review of Systems Review of Systems + sore throat + cough No pleuritic pain No wheezing ? nasal congestion No post-nasal drainage No sinus pain/pressure No itchy/red eyes No earache No hemoptysis No SOB No fever/chills No nausea No vomiting No abdominal pain + diarrhea, resolved No urinary symptoms No skin rash + fatigue + myalgias + headache    Physical Exam Triage Vital Signs ED Triage Vitals  Enc Vitals Group     BP --      Pulse Rate 03/07/20 1355 95     Resp 03/07/20 1355 18     Temp 03/07/20 1355 98.7 F (37.1 C)     Temp Source 03/07/20 1355 Oral     SpO2  03/07/20 1355 94 %     Weight 03/07/20 1400 247 lb (112 kg)     Height 03/07/20 1400 6' (1.829 m)     Head Circumference --      Peak Flow --      Pain Score 03/07/20 1400 6     Pain Loc --      Pain Edu? --      Excl. in [place]? --    No data found.  Updated Vital Signs Pulse 95    Temp 98.7 F (37.1 C) (Oral)    Resp 18    Ht 6' (1.829 m)    Wt 112 kg    SpO2 94%    BMI 33.50 kg/m   Visual Acuity Right Eye Distance:   Left Eye Distance:   Bilateral Distance:    Right Eye Near:   Left Eye Near:    Bilateral Near:     Physical Exam Nursing notes and Vital Signs reviewed. Appearance:  Patient appears stated age, and in no acute distress Eyes:  Pupils are equal, round, and reactive to light and accomodation.  Extraocular movement is intact.  Conjunctivae are not inflamed  Ears:  Canals normal.  Tympanic membranes normal.  Nose:  Mildly congested turbinates.  No sinus tenderness.  Pharynx:  Erythematous uvula Neck:  Supple.  Mildly enlarged lateral nodes are present, tender to palpation on the left.  Tender tonsillar nodes bilaterally. Lungs:  Clear to auscultation.  Breath sounds are equal.  Moving air well. Heart:  Regular rate and rhythm without murmurs, rubs, or gallops.  Abdomen:  Nontender without masses or hepatosplenomegaly.  Bowel sounds are present.  No CVA or  flank tenderness.  Extremities:  No edema.  Skin:  No rash present.   UC Treatments / Results  Labs (all labs ordered are listed, but only abnormal results are displayed) Labs Reviewed  STREP A DNA PROBE  POCT RAPID STREP A (OFFICE) negative    EKG   Radiology No results found.  Procedures Procedures (including critical care time)  Medications Ordered in UC Medications - No data to display  Initial Impression / Assessment and Plan / UC Course  I have reviewed the triage vital signs and the nursing notes.  Pertinent labs & imaging results that were available during my care of the patient were  reviewed by me and considered in my medical decision making (see chart for details).    Patient has multiple morbidities.  Begin empiric doxycycline. COVID19 PCR pending. Followup with Family Doctor if not improved in one week.   Final Clinical Impressions(s) / UC Diagnoses   Final diagnoses:  Acute pharyngitis, unspecified etiology  Viral URI with cough  Exposure to COVID-19 virus     Discharge Instructions     Take plain guaifenesin (1200mg  extended release tabs such as Mucinex) twice daily, with plenty of water, for cough and congestion.  Get adequate rest.   Continue daily neti pot for sinuses. Try warm salt water gargles for sore throat.  May take Delsym Cough Suppressant ("12 Hour Cough Relief") at bedtime for nighttime cough.   Isolate yourself until COVID-19 test result is available.   If your COVID19 test is positive, then you are infected with the novel coronavirus and could give the virus to others.  Please continue isolation at home for at least 10 days since the start of your symptoms.  Once you complete your 10 day quarantine, you may return to normal activities as long as you've not had a fever for over 24 hours (without taking fever reducing medicine) and your symptoms are improving. Please continue good preventive care measures, including:  frequent hand-washing, avoid touching your face, cover coughs/sneezes, stay out of crowds and keep a 6 foot distance from others.  Go to the nearest hospital emergency room if fever/cough/breathlessness are severe or illness seems like a threat to life.     ED Prescriptions    Medication Sig Dispense Auth. Provider   doxycycline (VIBRAMYCIN) 100 MG capsule Take one cap PO Q12hr with food. 14 capsule Kandra Nicolas, MD        Kandra Nicolas, MD 03/11/20 [id number]

## 2020-03-07 NOTE — Telephone Encounter (Signed)
Pt seen at UC today.

## 2020-03-07 NOTE — ED Triage Notes (Signed)
Sore throat since Saturday morning w/ general malaise  Pt had his flu vaccine this past Thursday  Throat lozenges & tylenol at 0200 for pain  Denies fever Pt had Moderna vaccine  Pt plays golf with 4 vaccinated friends & goes to [employer]  Denies fever or HA  Pt has been taking tylenol q 8hrs Uses a neti pot daily Uses CPAP

## 2020-03-07 NOTE — Discharge Instructions (Signed)
Take plain guaifenesin (1200mg  extended release tabs such as Mucinex) twice daily, with plenty of water, for cough and congestion.  Get adequate rest.   Continue daily neti pot for sinuses. Try warm salt water gargles for sore throat.  May take Delsym Cough Suppressant ("12 Hour Cough Relief") at bedtime for nighttime cough.   Isolate yourself until COVID-19 test result is available.   If your COVID19 test is positive, then you are infected with the novel coronavirus and could give the virus to others.  Please continue isolation at home for at least 10 days since the start of your symptoms.  Once you complete your 10 day quarantine, you may return to normal activities as long as you've not had a fever for over 24 hours (without taking fever reducing medicine) and your symptoms are improving. Please continue good preventive care measures, including:  frequent hand-washing, avoid touching your face, cover coughs/sneezes, stay out of crowds and keep a 6 foot distance from others.  Go to the nearest hospital emergency room if fever/cough/breathlessness are severe or illness seems like a threat to life.

## 2020-03-07 NOTE — Telephone Encounter (Signed)
Caller states has a severe sore throat, coughing up phlegm, no fever, and wanting to schedule an appt for today if possible, asking for a call back. Refused triage. Additional Comment Provided information for a call back from the office.

## 2020-03-08 LAB — STREP A DNA PROBE: Group A Strep Probe: NOT DETECTED

## 2020-03-10 LAB — SARS-COV-2, NAA 2 DAY TAT

## 2020-03-10 LAB — NOVEL CORONAVIRUS, NAA: SARS-CoV-2, NAA: NOT DETECTED

## 2020-03-11 ENCOUNTER — Telehealth: Payer: Self-pay | Admitting: Emergency Medicine

## 2020-03-11 NOTE — Telephone Encounter (Signed)
Message left for patient - both covid & strep are negative

## 2020-03-14 ENCOUNTER — Encounter: Payer: Self-pay | Admitting: General Practice

## 2020-03-14 ENCOUNTER — Other Ambulatory Visit: Payer: Self-pay | Admitting: Family Medicine

## 2020-03-14 ENCOUNTER — Other Ambulatory Visit: Payer: Self-pay

## 2020-03-14 ENCOUNTER — Ambulatory Visit (INDEPENDENT_AMBULATORY_CARE_PROVIDER_SITE_OTHER): Payer: Medicare Other | Admitting: Dermatology

## 2020-03-14 ENCOUNTER — Encounter: Payer: Self-pay | Admitting: Dermatology

## 2020-03-14 DIAGNOSIS — L578 Other skin changes due to chronic exposure to nonionizing radiation: Secondary | ICD-10-CM

## 2020-03-14 DIAGNOSIS — L570 Actinic keratosis: Secondary | ICD-10-CM | POA: Diagnosis not present

## 2020-03-14 DIAGNOSIS — I6523 Occlusion and stenosis of bilateral carotid arteries: Secondary | ICD-10-CM

## 2020-03-14 DIAGNOSIS — L820 Inflamed seborrheic keratosis: Secondary | ICD-10-CM | POA: Diagnosis not present

## 2020-03-14 DIAGNOSIS — E11649 Type 2 diabetes mellitus with hypoglycemia without coma: Secondary | ICD-10-CM

## 2020-03-14 NOTE — Progress Notes (Signed)
Follow-Up Visit   Subjective  Richard [name] is a 79 y.o. male who presents for the following: Follow-up (AKs and ISKs treated with LN2 3 months ago). He has a few spots to check today on right upper arm, left cheek, and left neck. Also L lower leg.  Most spots that were treated last time cleared up.  The following portions of the chart were reviewed this encounter and updated as appropriate:      Review of Systems:  No other skin or systemic complaints except as noted in HPI or Assessment and Plan.  Objective  Well appearing patient in no apparent distress; mood and affect are within normal limits.  A focused examination was performed including face, neck, arms. Relevant physical exam findings are noted in the Assessment and Plan.  Objective  Left Lateral Lower Leg x 1: 1.0cm pink scaly plaque  Objective  Left Hand Dorsum x 3, L forearm x 10, R hand dorsum x 2, R forearm x 4, R upper arm x 1 (20), R temple x 2, R preauricular x 2, R med lower cheek x 1, R mid cheek x 1, lower nasal dorsum x 1, R lower lip at vermillion x 1, L upper forehead x 2, L temple x 2, L cheek x 6, L neck x 1, L ear helix x 1 (20): Keratotic macules and papules    Assessment & Plan   Actinic Damage - diffuse scaly erythematous macules with underlying dyspigmentation - Recommend daily broad spectrum sunscreen SPF 30+ to sun-exposed areas, reapply every 2 hours as needed.  - Call for new or changing lesions.   Inflamed seborrheic keratosis Left Lateral Lower Leg x 1  Cyrotherapy today x 1 (2nd treatment today) If not clear on f/up will biopsy  Destruction of lesion - Left Lateral Lower Leg x 1  Destruction method: cryotherapy   Informed consent: discussed and consent obtained   Lesion destroyed using liquid nitrogen: Yes   Region frozen until ice ball extended beyond lesion: Yes   Outcome: patient tolerated procedure well with no complications   Post-procedure details: wound care instructions  given    AK (actinic keratosis) (40) R temple x 2, R preauricular x 2, R med lower cheek x 1, R mid cheek x 1, lower nasal dorsum x 1, R lower lip at vermillion x 1, L upper forehead x 2, L temple x 2, L cheek x 6, L neck x 1, L ear helix x 1 (20); Left Hand Dorsum x 3, L forearm x 10, R hand dorsum x 2, R forearm x 4, R upper arm x 1 (20)  Hypertrophic  Recheck L hand dorsum and L lat lower leg. May biopsy on f/u if not improved.  Destruction of lesion - Left Hand Dorsum x 3, L forearm x 10, R hand dorsum x 2, R forearm x 4, R upper arm x 1, R temple x 2, R preauricular x 2, R med lower cheek x 1, R mid cheek x 1, lower nasal dorsum x 1, R lower lip at vermillion x 1, L upper forehead x 2, L temple x 2, L cheek x 6, L neck x 1, L ear helix x 1  Destruction method: cryotherapy   Informed consent: discussed and consent obtained   Lesion destroyed using liquid nitrogen: Yes   Region frozen until ice ball extended beyond lesion: Yes   Outcome: patient tolerated procedure well with no complications   Post-procedure details: wound care instructions given  Return in about 3 months (around 06/13/2020) for AKs, possible bx.   IJamesetta Orleans, CMA, am acting as scribe for Brendolyn Patty, MD .  Documentation: I have reviewed the above documentation for accuracy and completeness, and I agree with the above.  Brendolyn Patty MD

## 2020-03-14 NOTE — Patient Instructions (Signed)
Cryotherapy Aftercare  . Wash gently with soap and water everyday.   . Apply Vaseline and Band-Aid daily until healed.  

## 2020-03-16 ENCOUNTER — Inpatient Hospital Stay (HOSPITAL_BASED_OUTPATIENT_CLINIC_OR_DEPARTMENT_OTHER): Payer: Medicare Other | Admitting: Family

## 2020-03-16 ENCOUNTER — Inpatient Hospital Stay: Payer: Medicare Other

## 2020-03-16 ENCOUNTER — Other Ambulatory Visit: Payer: Self-pay

## 2020-03-16 ENCOUNTER — Encounter: Payer: Self-pay | Admitting: Family

## 2020-03-16 ENCOUNTER — Inpatient Hospital Stay: Payer: Medicare Other | Attending: Hematology & Oncology

## 2020-03-16 VITALS — BP 120/65 | HR 76 | Temp 97.8°F | Resp 17 | Ht 72.0 in | Wt 255.1 lb

## 2020-03-16 VITALS — BP 120/65 | HR 76 | Temp 97.8°F | Resp 18

## 2020-03-16 DIAGNOSIS — Z886 Allergy status to analgesic agent status: Secondary | ICD-10-CM | POA: Diagnosis not present

## 2020-03-16 DIAGNOSIS — D500 Iron deficiency anemia secondary to blood loss (chronic): Secondary | ICD-10-CM

## 2020-03-16 DIAGNOSIS — D519 Vitamin B12 deficiency anemia, unspecified: Secondary | ICD-10-CM | POA: Diagnosis not present

## 2020-03-16 DIAGNOSIS — C8338 Diffuse large B-cell lymphoma, lymph nodes of multiple sites: Secondary | ICD-10-CM | POA: Insufficient documentation

## 2020-03-16 DIAGNOSIS — D508 Other iron deficiency anemias: Secondary | ICD-10-CM

## 2020-03-16 DIAGNOSIS — D510 Vitamin B12 deficiency anemia due to intrinsic factor deficiency: Secondary | ICD-10-CM | POA: Diagnosis not present

## 2020-03-16 DIAGNOSIS — G629 Polyneuropathy, unspecified: Secondary | ICD-10-CM | POA: Diagnosis not present

## 2020-03-16 DIAGNOSIS — I6523 Occlusion and stenosis of bilateral carotid arteries: Secondary | ICD-10-CM

## 2020-03-16 DIAGNOSIS — C8332 Diffuse large B-cell lymphoma, intrathoracic lymph nodes: Secondary | ICD-10-CM

## 2020-03-16 LAB — IRON AND TIBC
Iron: 74 ug/dL (ref 42–163)
Saturation Ratios: 23 % (ref 20–55)
TIBC: 326 ug/dL (ref 202–409)
UIBC: 253 ug/dL (ref 117–376)

## 2020-03-16 LAB — CMP (CANCER CENTER ONLY)
ALT: 17 U/L (ref 0–44)
AST: 19 U/L (ref 15–41)
Albumin: 4.4 g/dL (ref 3.5–5.0)
Alkaline Phosphatase: 62 U/L (ref 38–126)
Anion gap: 12 (ref 5–15)
BUN: 35 mg/dL — ABNORMAL HIGH (ref 8–23)
CO2: 27 mmol/L (ref 22–32)
Calcium: 10.5 mg/dL — ABNORMAL HIGH (ref 8.9–10.3)
Chloride: 98 mmol/L (ref 98–111)
Creatinine: 1.74 mg/dL — ABNORMAL HIGH (ref 0.61–1.24)
GFR, Est AFR Am: 42 mL/min — ABNORMAL LOW (ref >60)
GFR, Estimated: 36 mL/min — ABNORMAL LOW (ref >60)
Glucose, Bld: 200 mg/dL — ABNORMAL HIGH (ref 70–99)
Potassium: 3.7 mmol/L (ref 3.5–5.1)
Sodium: 137 mmol/L (ref 135–145)
Total Bilirubin: 0.6 mg/dL (ref 0.3–1.2)
Total Protein: 6.6 g/dL (ref 6.5–8.1)

## 2020-03-16 LAB — FERRITIN: Ferritin: 1305 ng/mL — ABNORMAL HIGH (ref 24–336)

## 2020-03-16 LAB — VITAMIN B12: Vitamin B-12: 208 pg/mL (ref 180–914)

## 2020-03-16 LAB — CBC WITH DIFFERENTIAL (CANCER CENTER ONLY)
Abs Immature Granulocytes: 0.1 10*3/uL — ABNORMAL HIGH (ref 0.00–0.07)
Basophils Absolute: 0.1 10*3/uL (ref 0.0–0.1)
Basophils Relative: 1 %
Eosinophils Absolute: 0.2 10*3/uL (ref 0.0–0.5)
Eosinophils Relative: 4 %
HCT: 37 % — ABNORMAL LOW (ref 39.0–52.0)
Hemoglobin: 12 g/dL — ABNORMAL LOW (ref 13.0–17.0)
Immature Granulocytes: 2 %
Lymphocytes Relative: 24 %
Lymphs Abs: 1.4 10*3/uL (ref 0.7–4.0)
MCH: 29.2 pg (ref 26.0–34.0)
MCHC: 32.4 g/dL (ref 30.0–36.0)
MCV: 90 fL (ref 80.0–100.0)
Monocytes Absolute: 0.7 10*3/uL (ref 0.1–1.0)
Monocytes Relative: 12 %
Neutro Abs: 3.3 10*3/uL (ref 1.7–7.7)
Neutrophils Relative %: 57 %
Platelet Count: 196 10*3/uL (ref 150–400)
RBC: 4.11 MIL/uL — ABNORMAL LOW (ref 4.22–5.81)
RDW: 15.9 % — ABNORMAL HIGH (ref 11.5–15.5)
WBC Count: 5.7 10*3/uL (ref 4.0–10.5)
nRBC: 0 % (ref 0.0–0.2)

## 2020-03-16 LAB — RETICULOCYTES
Immature Retic Fract: 27.4 % — ABNORMAL HIGH (ref 2.3–15.9)
RBC.: 4.04 MIL/uL — ABNORMAL LOW (ref 4.22–5.81)
Retic Count, Absolute: 66.3 10*3/uL (ref 19.0–186.0)
Retic Ct Pct: 1.6 % (ref 0.4–3.1)

## 2020-03-16 MED ORDER — DENOSUMAB 120 MG/1.7ML ~~LOC~~ SOLN
120.0000 mg | Freq: Once | SUBCUTANEOUS | Status: AC
  Administered 2020-03-16: 120 mg via SUBCUTANEOUS

## 2020-03-16 MED ORDER — HEPARIN SOD (PORK) LOCK FLUSH 100 UNIT/ML IV SOLN
500.0000 [IU] | Freq: Once | INTRAVENOUS | Status: AC | PRN
  Administered 2020-03-16: 500 [IU]
  Filled 2020-03-16: qty 5

## 2020-03-16 MED ORDER — CYANOCOBALAMIN 1000 MCG/ML IJ SOLN
1000.0000 ug | Freq: Once | INTRAMUSCULAR | Status: AC
  Administered 2020-03-16: 1000 ug via INTRAMUSCULAR

## 2020-03-16 MED ORDER — DENOSUMAB 120 MG/1.7ML ~~LOC~~ SOLN
SUBCUTANEOUS | Status: AC
  Filled 2020-03-16: qty 1.7

## 2020-03-16 MED ORDER — CYANOCOBALAMIN 1000 MCG/ML IJ SOLN
INTRAMUSCULAR | Status: AC
  Filled 2020-03-16: qty 1

## 2020-03-16 MED ORDER — SODIUM CHLORIDE 0.9% FLUSH
10.0000 mL | INTRAVENOUS | Status: DC | PRN
  Administered 2020-03-16: 10 mL
  Filled 2020-03-16: qty 10

## 2020-03-16 NOTE — Patient Instructions (Signed)

## 2020-03-16 NOTE — Patient Instructions (Signed)
Implanted Port Insertion, Care After °This sheet gives you information about how to care for yourself after your procedure. Your health care provider may also give you more specific instructions. If you have problems or questions, contact your health care provider. °What can I expect after the procedure? °After the procedure, it is common to have: °· Discomfort at the port insertion site. °· Bruising on the skin over the port. This should improve over 3-4 days. °Follow these instructions at home: °Port care °· After your port is placed, you will get a manufacturer's information card. The card has information about your port. Keep this card with you at all times. °· Take care of the port as told by your health care provider. Ask your health care provider if you or a family member can get training for taking care of the port at home. A home health care nurse may also take care of the port. °· Make sure to remember what type of port you have. °Incision care ° °  ° °· Follow instructions from your health care provider about how to take care of your port insertion site. Make sure you: °? Wash your hands with soap and water before and after you change your bandage (dressing). If soap and water are not available, use hand sanitizer. °? Change your dressing as told by your health care provider. °? Leave stitches (sutures), skin glue, or adhesive strips in place. These skin closures may need to stay in place for 2 weeks or longer. If adhesive strip edges start to loosen and curl up, you may trim the loose edges. Do not remove adhesive strips completely unless your health care provider tells you to do that. °· Check your port insertion site every day for signs of infection. Check for: °? Redness, swelling, or pain. °? Fluid or blood. °? Warmth. °? Pus or a bad smell. °Activity °· Return to your normal activities as told by your health care provider. Ask your health care provider what activities are safe for you. °· Do not  lift anything that is heavier than 10 lb (4.5 kg), or the limit that you are told, until your health care provider says that it is safe. °General instructions °· Take over-the-counter and prescription medicines only as told by your health care provider. °· Do not take baths, swim, or use a hot tub until your health care provider approves. Ask your health care provider if you may take showers. You may only be allowed to take sponge baths. °· Do not drive for 24 hours if you were given a sedative during your procedure. °· Wear a medical alert bracelet in case of an emergency. This will tell any health care providers that you have a port. °· Keep all follow-up visits as told by your health care provider. This is important. °Contact a health care provider if: °· You cannot flush your port with saline as directed, or you cannot draw blood from the port. °· You have a fever or chills. °· You have redness, swelling, or pain around your port insertion site. °· You have fluid or blood coming from your port insertion site. °· Your port insertion site feels warm to the touch. °· You have pus or a bad smell coming from the port insertion site. °Get help right away if: °· You have chest pain or shortness of breath. °· You have bleeding from your port that you cannot control. °Summary °· Take care of the port as told by your health   care provider. Keep the manufacturer's information card with you at all times. °· Change your dressing as told by your health care provider. °· Contact a health care provider if you have a fever or chills or if you have redness, swelling, or pain around your port insertion site. °· Keep all follow-up visits as told by your health care provider. °This information is not intended to replace advice given to you by your health care provider. Make sure you discuss any questions you have with your health care provider. °Document Revised: 01/13/2018 Document Reviewed: 01/13/2018 °Elsevier Patient Education ©  2020 [employer] ° °

## 2020-03-16 NOTE — Progress Notes (Signed)
Hematology and Oncology Follow Up Visit  Richard [name] [id number] 1940-09-12 79 y.o. 03/16/2020   Principle Diagnosis:  Diffuse large cell non-Hodgkin's lymphoma (IPI = 3) - NOT "double hit" Pernicious anemia Iron deficiency secondary to bleeding  Past Therapy: R-CHOP - s/p cycle8 - completed 08/2017  Current Therapy: Vitamin B12 1 mg IM every month Xgeva 120 mg subcu q 3 months - next doseduein 03/2020 IV ironas indicated   Interim History:  Richard [name] is here today for follow-up and injections. He is doing well and golfed yesterday.  He recently saw his dermatologist and had over 40 skin lesions removed. He is now wearing long sleeves and sun screen when outside. He has had no issue with blood loss. No bruising or petechiae.  No fever, chills, n/v, cough, rash, dizziness, SOB, chest pain, palpitations, abdominal pain or changes in bowel or bladder habits.  The swelling in his legs is stable. Pedal pulses are 2+. He is on a daily diuretic which helps reduce the fluid retention.  Numbness and tingling in feet is unchanged. No falls or syncopal episodes to report. He states that this does not effect his balance or gait.  He has maintained a good appetite and is staying well hydrated. His weight is stable.   ECOG Performance Status: 1 - Symptomatic but completely ambulatory  Medications:  Allergies as of 03/16/2020      Reactions   Benazepril Swelling   angioedema; he is not a candidate for any angiotensin receptor blockers because of this significant allergic reaction. Because of a history of documented adverse serious drug reaction;Medi Alert bracelet  is recommended   Hctz [hydrochlorothiazide] Anaphylaxis, Swelling   Tongue and lip swelling   Aspirin Other (See Comments)   Gastritis, cant take 325 Mg aspirin    Lactose Intolerance (gi) Nausea And Vomiting      Medication List       Accurate as of March 16, 2020  9:16 AM. If you have any questions, ask  your nurse or doctor.        allopurinol 300 MG tablet Commonly known as: ZYLOPRIM Take 450 mg by mouth daily.   aluminum hydroxide-magnesium carbonate 95-358 MG/15ML Susp Commonly known as: GAVISCON Take 15 mLs by mouth as needed for indigestion or heartburn.   amLODipine 5 MG tablet Commonly known as: NORVASC TAKE 1 TABLET BY MOUTH TWICE A DAY   atorvastatin 80 MG tablet Commonly known as: LIPITOR TAKE ONE TABLET BY MOUTH AT BEDTIME   azelastine 0.1 % nasal spray Commonly known as: ASTELIN Place 2 sprays into both nostrils at bedtime as needed for rhinitis or allergies.   doxycycline 100 MG capsule Commonly known as: VIBRAMYCIN Take one cap PO Q12hr with food.   Eliquis 2.5 MG Tabs tablet Generic drug: apixaban TAKE 1 TABLET BY MOUTH TWICE A DAY   esomeprazole 40 MG capsule Commonly known as: NexIUM Take 1 capsule (40 mg total) by mouth daily.   ezetimibe 10 MG tablet Commonly known as: ZETIA TAKE 1 TABLET BY MOUTH EVERY DAY   fenofibrate 160 MG tablet Take 1 tablet (160 mg total) by mouth daily.   folic acid 1 MG tablet Commonly known as: FOLVITE TAKE 2 TABLETS BY MOUTH EVERY DAY   freestyle lancets USE TWICE A DAY TO CHECK BLOOD SUGAR. DX E11.9   FREESTYLE LITE test strip Generic drug: glucose blood USE TO TEST BLOOD SUGAR ONCE A DAY. DX CODE: E11.9   furosemide 40 MG tablet Commonly known as: LASIX  TAKE 3 TABLETS BY MOUTH 2 (TWO) TIMES DAILY.   glimepiride 1 MG tablet Commonly known as: AMARYL TAKE 1 TABLET (1 MG TOTAL) BY MOUTH DAILY WITH BREAKFAST.   hydrocortisone cream 1 % Apply 1 application topically 2 (two) times daily.   Klor-Con M20 20 MEQ tablet Generic drug: potassium chloride SA TAKE 2 TABLETS BY MOUTH 2 (TWO) TIMES DAILY.   metFORMIN 1000 MG tablet Commonly known as: GLUCOPHAGE TAKE 1 & 1/2 TABLETS BY MOUTH EVERY MORNING AND 1 TABLET IN THE EVENING.   metoprolol tartrate 25 MG tablet Commonly known as: LOPRESSOR TAKE  ONE-HALF TABLET BY MOUTH TWICE DAILY   nitroGLYCERIN 0.4 MG SL tablet Commonly known as: NITROSTAT PLACE 1 TABLET (0.4 MG TOTAL) UNDER THE TONGUE EVERY 5 (FIVE) MINUTES AS NEEDED FOR CHEST PAIN.   polycarbophil 625 MG tablet Commonly known as: FIBERCON Take 625 mg by mouth daily.   pregabalin 150 MG capsule Commonly known as: LYRICA TAKE 1 CAPSULE BY MOUTH TWICE A DAY   psyllium 58.6 % powder Commonly known as: METAMUCIL Take 1 packet by mouth daily as needed (constipation).   Vascepa 1 g capsule Generic drug: icosapent Ethyl TAKE 2 CAPSULES BY MOUTH TWICE A DAY       Allergies:  Allergies  Allergen Reactions  . Benazepril Swelling    angioedema; he is not a candidate for any angiotensin receptor blockers because of this significant allergic reaction. Because of a history of documented adverse serious drug reaction;Medi Alert bracelet  is recommended  . Hctz [Hydrochlorothiazide] Anaphylaxis and Swelling    Tongue and lip swelling   . Aspirin Other (See Comments)    Gastritis, cant take 325 Mg aspirin   . Lactose Intolerance (Gi) Nausea And Vomiting    Past Medical History, Surgical history, Social history, and Family History were reviewed and updated.  Review of Systems: All other 10 point review of systems is negative.   Physical Exam:  height is 6' (1.829 m) and weight is 255 lb 1.9 oz (115.7 kg). His oral temperature is 97.8 F (36.6 C). His blood pressure is 120/65 and his pulse is 76. His respiration is 17 and oxygen saturation is 98%.   Wt Readings from Last 3 Encounters:  03/16/20 255 lb 1.9 oz (115.7 kg)  03/07/20 247 lb (112 kg)  02/18/20 250 lb (113.4 kg)    Ocular: Sclerae unicteric, pupils equal, round and reactive to light Ear-nose-throat: Oropharynx clear, dentition fair Lymphatic: No cervical or supraclavicular adenopathy Lungs no rales or rhonchi, good excursion bilaterally Heart regular rate and rhythm, no murmur appreciated Abd soft,  nontender, positive bowel sounds MSK no focal spinal tenderness, no joint edema Neuro: non-focal, well-oriented, appropriate affect Breasts: Deferred   Lab Results  Component Value Date   WBC 5.7 03/16/2020   HGB 12.0 (L) 03/16/2020   HCT 37.0 (L) 03/16/2020   MCV 90.0 03/16/2020   PLT 196 03/16/2020   Lab Results  Component Value Date   FERRITIN 905 (H) 02/18/2020   IRON 62 02/18/2020   TIBC 345 02/18/2020   UIBC 283 02/18/2020   IRONPCTSAT 18 (L) 02/18/2020   Lab Results  Component Value Date   RETICCTPCT 1.6 03/16/2020   RBC 4.04 (L) 03/16/2020   RETICCTABS 52.1 11/22/2011   No results found for: [facility] Lab Results  Component Value Date   IGA 159 03/09/2012   Lab Results  Component Value Date   ALBUMINELP 4.2 07/27/2018   MSPIKE Not Observed 07/27/2018  Chemistry      Component Value Date/Time   NA 137 03/16/2020 0814   NA 136 03/16/2019 0938   NA 144 06/27/2017 0857   K 3.7 03/16/2020 0814   K 3.9 06/27/2017 0857   CL 98 03/16/2020 0814   CL 103 06/27/2017 0857   CO2 27 03/16/2020 0814   CO2 26 06/27/2017 0857   BUN 35 (H) 03/16/2020 0814   BUN 34 (H) 03/16/2019 0938   BUN 12 06/27/2017 0857   CREATININE 1.74 (H) 03/16/2020 0814   CREATININE 1.0 06/27/2017 0857      Component Value Date/Time   CALCIUM 10.5 (H) 03/16/2020 0814   CALCIUM 9.2 06/27/2017 0857   ALKPHOS 62 03/16/2020 0814   ALKPHOS 128 (H) 06/27/2017 0857   AST 19 03/16/2020 0814   ALT 17 03/16/2020 0814   ALT 24 06/27/2017 0857   BILITOT 0.6 03/16/2020 0814       Impression and Plan: Mr. Coad is a very pleasant 79 yo caucasian gentleman withdiffuse large B-cell lymphoma (not"double hit"lymphoma). He completed treatment in February 2019.  He remains in remission and so far there has been no evidence of recurrence.  He received Xgeva and B 12 today as planned.  Iron studies are pending. We will replace if needed.  Follow-up in 1 month.  He  can contact our office with any questions or concerns.  Laverna Peace, NP 9/16/20219:16 AM

## 2020-03-17 ENCOUNTER — Ambulatory Visit: Payer: Medicare Other | Admitting: Family

## 2020-03-17 ENCOUNTER — Other Ambulatory Visit: Payer: Medicare Other

## 2020-03-17 ENCOUNTER — Ambulatory Visit: Payer: Medicare Other

## 2020-03-21 ENCOUNTER — Encounter: Payer: Self-pay | Admitting: Podiatry

## 2020-03-21 ENCOUNTER — Other Ambulatory Visit: Payer: Self-pay | Admitting: Cardiology

## 2020-03-21 ENCOUNTER — Other Ambulatory Visit: Payer: Self-pay

## 2020-03-21 ENCOUNTER — Ambulatory Visit (INDEPENDENT_AMBULATORY_CARE_PROVIDER_SITE_OTHER): Payer: Medicare Other | Admitting: Podiatry

## 2020-03-21 DIAGNOSIS — M79675 Pain in left toe(s): Secondary | ICD-10-CM

## 2020-03-21 DIAGNOSIS — M5136 Other intervertebral disc degeneration, lumbar region: Secondary | ICD-10-CM | POA: Diagnosis not present

## 2020-03-21 DIAGNOSIS — M79674 Pain in right toe(s): Secondary | ICD-10-CM

## 2020-03-21 DIAGNOSIS — E1142 Type 2 diabetes mellitus with diabetic polyneuropathy: Secondary | ICD-10-CM

## 2020-03-21 DIAGNOSIS — B351 Tinea unguium: Secondary | ICD-10-CM | POA: Diagnosis not present

## 2020-03-21 NOTE — Progress Notes (Signed)
This patient returns to my office for at risk foot care.  This patient requires this care by a professional since this patient will be at risk due to having diabetic neuropathy  and coagulation defect.  Patient is taking eliquis. This patient is unable to cut nails himself since the patient cannot reach his nails.These nails are painful walking and wearing shoes.  This patient presents for at risk foot care today.  General Appearance  Alert, conversant and in no acute stress.  Vascular  Dorsalis pedis and posterior tibial  pulses are palpable  bilaterally.  Capillary return is within normal limits  bilaterally. Temperature is within normal limits  bilaterally.  Neurologic  Senn-Weinstein monofilament wire test within normal limits  Left foot.  LOPS right foot is absent.  . Muscle power within normal limits bilaterally.  Nails Thick disfigured discolored nails with subungual debris  from hallux to fifth toes bilaterally. No evidence of bacterial infection or drainage bilaterally.  Orthopedic  No limitations of motion  feet .  No crepitus or effusions noted.  No bony pathology or digital deformities noted.  Plantar flexed fifth metatarsal right foot.  Skin  normotropic skin with noted bilaterally.  No signs of infections or ulcers noted.   Asymptomatic porokeratosis  Right foot.  Onychomycosis  Pain in right toes  Pain in left toes  Consent was obtained for treatment procedures.   Mechanical debridement of nails 1-5  bilaterally performed with a nail nipper.  Filed with dremel without incident.    Return office visit   12 weeks.                  Told patient to return for periodic foot care and evaluation due to potential at risk complications.   Gardiner Barefoot DPM

## 2020-04-09 ENCOUNTER — Other Ambulatory Visit: Payer: Self-pay | Admitting: Family Medicine

## 2020-04-09 DIAGNOSIS — E781 Pure hyperglyceridemia: Secondary | ICD-10-CM

## 2020-04-12 ENCOUNTER — Inpatient Hospital Stay: Payer: Medicare Other

## 2020-04-12 ENCOUNTER — Inpatient Hospital Stay (HOSPITAL_BASED_OUTPATIENT_CLINIC_OR_DEPARTMENT_OTHER): Payer: Medicare Other | Admitting: Family

## 2020-04-12 ENCOUNTER — Inpatient Hospital Stay: Payer: Medicare Other | Attending: Hematology & Oncology

## 2020-04-12 ENCOUNTER — Encounter: Payer: Self-pay | Admitting: Family

## 2020-04-12 ENCOUNTER — Telehealth: Payer: Self-pay | Admitting: Family

## 2020-04-12 ENCOUNTER — Other Ambulatory Visit: Payer: Self-pay

## 2020-04-12 ENCOUNTER — Other Ambulatory Visit: Payer: Self-pay | Admitting: Hematology & Oncology

## 2020-04-12 VITALS — BP 147/75 | HR 95 | Temp 98.2°F | Resp 18 | Wt 250.0 lb

## 2020-04-12 DIAGNOSIS — C8332 Diffuse large B-cell lymphoma, intrathoracic lymph nodes: Secondary | ICD-10-CM | POA: Diagnosis not present

## 2020-04-12 DIAGNOSIS — D510 Vitamin B12 deficiency anemia due to intrinsic factor deficiency: Secondary | ICD-10-CM | POA: Insufficient documentation

## 2020-04-12 DIAGNOSIS — D519 Vitamin B12 deficiency anemia, unspecified: Secondary | ICD-10-CM

## 2020-04-12 DIAGNOSIS — I6523 Occlusion and stenosis of bilateral carotid arteries: Secondary | ICD-10-CM | POA: Diagnosis not present

## 2020-04-12 DIAGNOSIS — C8338 Diffuse large B-cell lymphoma, lymph nodes of multiple sites: Secondary | ICD-10-CM | POA: Diagnosis not present

## 2020-04-12 DIAGNOSIS — D500 Iron deficiency anemia secondary to blood loss (chronic): Secondary | ICD-10-CM | POA: Diagnosis not present

## 2020-04-12 DIAGNOSIS — Z886 Allergy status to analgesic agent status: Secondary | ICD-10-CM | POA: Insufficient documentation

## 2020-04-12 DIAGNOSIS — D508 Other iron deficiency anemias: Secondary | ICD-10-CM

## 2020-04-12 DIAGNOSIS — G629 Polyneuropathy, unspecified: Secondary | ICD-10-CM | POA: Diagnosis not present

## 2020-04-12 LAB — RETICULOCYTES
Immature Retic Fract: 20.9 % — ABNORMAL HIGH (ref 2.3–15.9)
RBC.: 4.4 MIL/uL (ref 4.22–5.81)
Retic Count, Absolute: 67.8 10*3/uL (ref 19.0–186.0)
Retic Ct Pct: 1.5 % (ref 0.4–3.1)

## 2020-04-12 LAB — CBC WITH DIFFERENTIAL (CANCER CENTER ONLY)
Abs Immature Granulocytes: 0.06 10*3/uL (ref 0.00–0.07)
Basophils Absolute: 0 10*3/uL (ref 0.0–0.1)
Basophils Relative: 0 %
Eosinophils Absolute: 0.1 10*3/uL (ref 0.0–0.5)
Eosinophils Relative: 1 %
HCT: 39.1 % (ref 39.0–52.0)
Hemoglobin: 12.7 g/dL — ABNORMAL LOW (ref 13.0–17.0)
Immature Granulocytes: 1 %
Lymphocytes Relative: 22 %
Lymphs Abs: 1.6 10*3/uL (ref 0.7–4.0)
MCH: 29.3 pg (ref 26.0–34.0)
MCHC: 32.5 g/dL (ref 30.0–36.0)
MCV: 90.3 fL (ref 80.0–100.0)
Monocytes Absolute: 0.6 10*3/uL (ref 0.1–1.0)
Monocytes Relative: 8 %
Neutro Abs: 5.2 10*3/uL (ref 1.7–7.7)
Neutrophils Relative %: 68 %
Platelet Count: 177 10*3/uL (ref 150–400)
RBC: 4.33 MIL/uL (ref 4.22–5.81)
RDW: 16.4 % — ABNORMAL HIGH (ref 11.5–15.5)
WBC Count: 7.6 10*3/uL (ref 4.0–10.5)
nRBC: 0 % (ref 0.0–0.2)

## 2020-04-12 LAB — CMP (CANCER CENTER ONLY)
ALT: 24 U/L (ref 0–44)
AST: 22 U/L (ref 15–41)
Albumin: 4.5 g/dL (ref 3.5–5.0)
Alkaline Phosphatase: 53 U/L (ref 38–126)
Anion gap: 11 (ref 5–15)
BUN: 44 mg/dL — ABNORMAL HIGH (ref 8–23)
CO2: 27 mmol/L (ref 22–32)
Calcium: 10.2 mg/dL (ref 8.9–10.3)
Chloride: 101 mmol/L (ref 98–111)
Creatinine: 1.78 mg/dL — ABNORMAL HIGH (ref 0.61–1.24)
GFR, Estimated: 35 mL/min — ABNORMAL LOW (ref >60)
Glucose, Bld: 147 mg/dL — ABNORMAL HIGH (ref 70–99)
Potassium: 4.1 mmol/L (ref 3.5–5.1)
Sodium: 139 mmol/L (ref 135–145)
Total Bilirubin: 0.5 mg/dL (ref 0.3–1.2)
Total Protein: 6.7 g/dL (ref 6.5–8.1)

## 2020-04-12 LAB — FERRITIN: Ferritin: 776 ng/mL — ABNORMAL HIGH (ref 24–336)

## 2020-04-12 LAB — IRON AND TIBC
Iron: 70 ug/dL (ref 45–182)
Saturation Ratios: 17 % — ABNORMAL LOW (ref 17.9–39.5)
TIBC: 415 ug/dL (ref 250–450)
UIBC: 345 ug/dL

## 2020-04-12 LAB — VITAMIN B12: Vitamin B-12: 160 pg/mL — ABNORMAL LOW (ref 180–914)

## 2020-04-12 MED ORDER — CYANOCOBALAMIN 1000 MCG/ML IJ SOLN
1000.0000 ug | Freq: Once | INTRAMUSCULAR | Status: DC
  Administered 2020-04-12: 1000 ug via INTRAMUSCULAR

## 2020-04-12 MED ORDER — SODIUM CHLORIDE 0.9% FLUSH
10.0000 mL | INTRAVENOUS | Status: DC | PRN
  Administered 2020-04-12: 10 mL
  Filled 2020-04-12: qty 10

## 2020-04-12 MED ORDER — HEPARIN SOD (PORK) LOCK FLUSH 100 UNIT/ML IV SOLN
500.0000 [IU] | Freq: Once | INTRAVENOUS | Status: AC | PRN
  Administered 2020-04-12: 500 [IU]
  Filled 2020-04-12: qty 5

## 2020-04-12 MED ORDER — CYANOCOBALAMIN 1000 MCG/ML IJ SOLN
INTRAMUSCULAR | Status: AC
  Filled 2020-04-12: qty 1

## 2020-04-12 NOTE — Telephone Encounter (Signed)
Appointments scheduled calendar printed per 10/13 los

## 2020-04-12 NOTE — Patient Instructions (Signed)

## 2020-04-12 NOTE — Progress Notes (Signed)
Hematology and Oncology Follow Up Visit  Richard [name] [id number] 09-Jun-1941 79 y.o. 04/12/2020   Principle Diagnosis:  Diffuse large cell non-Hodgkin's lymphoma (IPI = 3) - NOT "double hit" Pernicious anemia Iron deficiency secondary to bleeding  Past Therapy: R-CHOP - s/p cycle8 - completed 08/2017  Current Therapy: Vitamin B12 1 mg IM every month Xgeva 120 mg subcu q 3 months - next doseduein 03/2020 IV ironas indicated   Interim History:  Richard [name] is here today for follow-up and B 12 injection. He continues to do well and still enjoys golfing 3 days a week.  B 12 level last month was 208.  No fever, chills, n/v, cough, rash, dizziness, SOB, chest pain, palpitations, abdominal pain or changes in bowel or bladder habits.  No episodes of blood loss. He bruises easily but not in excess. No petechiae.  Swelling in his legs and feet is stable. Pedal pulses are 2+.  No changes in the numbness and tingling in his feet.  No falls or syncope.  He has maintained a good appetite and is staying well hydrated. His weight is stable.   ECOG Performance Status: 1 - Symptomatic but completely ambulatory  Medications:  Allergies as of 04/12/2020      Reactions   Benazepril Swelling   angioedema; he is not a candidate for any angiotensin receptor blockers because of this significant allergic reaction. Because of a history of documented adverse serious drug reaction;Medi Alert bracelet  is recommended   Hctz [hydrochlorothiazide] Anaphylaxis, Swelling   Tongue and lip swelling   Aspirin Other (See Comments)   Gastritis, cant take 325 Mg aspirin    Lactose Intolerance (gi) Nausea And Vomiting      Medication List       Accurate as of April 12, 2020  1:19 PM. If you have any questions, ask your nurse or doctor.        allopurinol 300 MG tablet Commonly known as: ZYLOPRIM Take 450 mg by mouth daily.   aluminum hydroxide-magnesium carbonate 95-358 MG/15ML  Susp Commonly known as: GAVISCON Take 15 mLs by mouth as needed for indigestion or heartburn.   amLODipine 5 MG tablet Commonly known as: NORVASC TAKE 1 TABLET BY MOUTH TWICE A DAY   atorvastatin 80 MG tablet Commonly known as: LIPITOR TAKE ONE TABLET BY MOUTH AT BEDTIME   azelastine 0.1 % nasal spray Commonly known as: ASTELIN Place 2 sprays into both nostrils at bedtime as needed for rhinitis or allergies.   doxycycline 100 MG capsule Commonly known as: VIBRAMYCIN Take one cap PO Q12hr with food.   Eliquis 2.5 MG Tabs tablet Generic drug: apixaban TAKE 1 TABLET BY MOUTH TWICE A DAY   esomeprazole 40 MG capsule Commonly known as: NexIUM Take 1 capsule (40 mg total) by mouth daily.   ezetimibe 10 MG tablet Commonly known as: ZETIA TAKE 1 TABLET BY MOUTH EVERY DAY   fenofibrate 160 MG tablet Take 1 tablet (160 mg total) by mouth daily.   folic acid 1 MG tablet Commonly known as: FOLVITE TAKE 2 TABLETS BY MOUTH EVERY DAY   freestyle lancets USE TWICE A DAY TO CHECK BLOOD SUGAR. DX E11.9   FREESTYLE LITE test strip Generic drug: glucose blood USE TO TEST BLOOD SUGAR ONCE A DAY. DX CODE: E11.9   furosemide 40 MG tablet Commonly known as: LASIX TAKE 3 TABLETS BY MOUTH 2 (TWO) TIMES DAILY.   glimepiride 1 MG tablet Commonly known as: AMARYL TAKE 1 TABLET (1 MG TOTAL) BY  MOUTH DAILY WITH BREAKFAST.   hydrocortisone cream 1 % Apply 1 application topically 2 (two) times daily.   metFORMIN 1000 MG tablet Commonly known as: GLUCOPHAGE TAKE 1 & 1/2 TABLETS BY MOUTH EVERY MORNING AND 1 TABLET IN THE EVENING.   metoprolol tartrate 25 MG tablet Commonly known as: LOPRESSOR TAKE ONE-HALF TABLET BY MOUTH TWICE DAILY   nitroGLYCERIN 0.4 MG SL tablet Commonly known as: NITROSTAT PLACE 1 TABLET (0.4 MG TOTAL) UNDER THE TONGUE EVERY 5 (FIVE) MINUTES AS NEEDED FOR CHEST PAIN.   polycarbophil 625 MG tablet Commonly known as: FIBERCON Take 625 mg by mouth daily.    potassium chloride SA 20 MEQ tablet Commonly known as: Klor-Con M20 Take 2 tablets (40 mEq total) by mouth 2 (two) times daily.   pregabalin 150 MG capsule Commonly known as: LYRICA TAKE 1 CAPSULE BY MOUTH TWICE A DAY   psyllium 58.6 % powder Commonly known as: METAMUCIL Take 1 packet by mouth daily as needed (constipation).   Vascepa 1 g capsule Generic drug: icosapent Ethyl TAKE 2 CAPSULES BY MOUTH TWICE A DAY       Allergies:  Allergies  Allergen Reactions  . Benazepril Swelling    angioedema; he is not a candidate for any angiotensin receptor blockers because of this significant allergic reaction. Because of a history of documented adverse serious drug reaction;Medi Alert bracelet  is recommended  . Hctz [Hydrochlorothiazide] Anaphylaxis and Swelling    Tongue and lip swelling   . Aspirin Other (See Comments)    Gastritis, cant take 325 Mg aspirin   . Lactose Intolerance (Gi) Nausea And Vomiting    Past Medical History, Surgical history, Social history, and Family History were reviewed and updated.  Review of Systems: All other 10 point review of systems is negative.   Physical Exam:  vitals were not taken for this visit.   Wt Readings from Last 3 Encounters:  03/16/20 255 lb 1.9 oz (115.7 kg)  03/07/20 247 lb (112 kg)  02/18/20 250 lb (113.4 kg)    Ocular: Sclerae unicteric, pupils equal, round and reactive to light Ear-nose-throat: Oropharynx clear, dentition fair Lymphatic: No cervical or supraclavicular adenopathy Lungs no rales or rhonchi, good excursion bilaterally Heart regular rate and rhythm, no murmur appreciated Abd soft, nontender, positive bowel sounds MSK no focal spinal tenderness, no joint edema Neuro: non-focal, well-oriented, appropriate affect Breasts: Deferred   Lab Results  Component Value Date   WBC 5.7 03/16/2020   HGB 12.0 (L) 03/16/2020   HCT 37.0 (L) 03/16/2020   MCV 90.0 03/16/2020   PLT 196 03/16/2020   Lab Results   Component Value Date   FERRITIN 1,305 (H) 03/16/2020   IRON 74 03/16/2020   TIBC 326 03/16/2020   UIBC 253 03/16/2020   IRONPCTSAT 23 03/16/2020   Lab Results  Component Value Date   RETICCTPCT 1.6 03/16/2020   RBC 4.04 (L) 03/16/2020   RETICCTABS 52.1 11/22/2011   No results found for: [facility] Lab Results  Component Value Date   IGA 159 03/09/2012   Lab Results  Component Value Date   ALBUMINELP 4.2 07/27/2018   MSPIKE Not Observed 07/27/2018     Chemistry      Component Value Date/Time   NA 137 03/16/2020 0814   NA 136 03/16/2019 0938   NA 144 06/27/2017 0857   K 3.7 03/16/2020 0814   K 3.9 06/27/2017 0857   CL 98 03/16/2020 0814   CL 103 06/27/2017 0857   CO2 27  03/16/2020 0814   CO2 26 06/27/2017 0857   BUN 35 (H) 03/16/2020 0814   BUN 34 (H) 03/16/2019 0938   BUN 12 06/27/2017 0857   CREATININE 1.74 (H) 03/16/2020 0814   CREATININE 1.0 06/27/2017 0857      Component Value Date/Time   CALCIUM 10.5 (H) 03/16/2020 0814   CALCIUM 9.2 06/27/2017 0857   ALKPHOS 62 03/16/2020 0814   ALKPHOS 128 (H) 06/27/2017 0857   AST 19 03/16/2020 0814   ALT 17 03/16/2020 0814   ALT 24 06/27/2017 0857   BILITOT 0.6 03/16/2020 0814       Impression and Plan: Richard [name] is a very pleasant 79yo caucasian gentleman withdiffuse large B-cell lymphoma (not"double hit"lymphoma). He completed treatment in February 2019.  He is doing well and has no complaints at this time.  He received his B 12 today as planned.  Iron studies are pending. We will replace if needed.  Follow-up in 1 month.  He can contact our office with any questions or concerns.   Laverna Peace, NP 10/13/20211:19 PM

## 2020-04-12 NOTE — Progress Notes (Signed)
Patient discharged ambulatory and stable.

## 2020-04-12 NOTE — Patient Instructions (Signed)

## 2020-04-13 ENCOUNTER — Telehealth: Payer: Self-pay | Admitting: Pharmacist

## 2020-04-13 ENCOUNTER — Ambulatory Visit: Payer: Medicare Other

## 2020-04-13 ENCOUNTER — Other Ambulatory Visit: Payer: Self-pay

## 2020-04-13 VITALS — BP 129/64 | HR 89 | Wt 249.4 lb

## 2020-04-13 DIAGNOSIS — I10 Essential (primary) hypertension: Secondary | ICD-10-CM

## 2020-04-13 DIAGNOSIS — E1165 Type 2 diabetes mellitus with hyperglycemia: Secondary | ICD-10-CM

## 2020-04-13 DIAGNOSIS — E1169 Type 2 diabetes mellitus with other specified complication: Secondary | ICD-10-CM

## 2020-04-13 DIAGNOSIS — E785 Hyperlipidemia, unspecified: Secondary | ICD-10-CM

## 2020-04-13 DIAGNOSIS — N183 Chronic kidney disease, stage 3 unspecified: Secondary | ICD-10-CM

## 2020-04-13 NOTE — Chronic Care Management (AMB) (Signed)
Chronic Care Management Pharmacy  Name: Richard [name]  MRN: [id number] DOB: 1941/02/27  Chief Complaint/ HPI  Richard [name],  79 y.o. , male presents for their Initial CCM visit with the clinical pharmacist In office.  PCP : [name], DO  Their chronic conditions include: Hypertension, Hyperlipidemia/CAD, Diabetes, AFib, Heart Failure, Neuropathy, Gout, GERD, Allergic Rhinitis  Office Visits: 11/22/19: Visit w/ Dr. Etter Sjogren - Possible referral to lipid clinic.  Consult Visit: 03/21/20: Podiatry visit w/ Dr. Prudence Davidson - Debridement  03/16/20: Heme/Onc visit w/ Laverna Peace, NP - Completed treatment for diffuse large B-cell lymphoma in February 2019. Received Xgeva and B12 at visit  03/14/20: Derm visit w/ Dr. Nicole Kindred - Inflamed seborrheic keratosis: Cryotherapy   Medications: Outpatient Encounter Medications as of 04/13/2020  Medication Sig  . allopurinol (ZYLOPRIM) 300 MG tablet Take 450 mg by mouth daily.   [name] aluminum hydroxide-magnesium carbonate (GAVISCON) 95-358 MG/15ML SUSP Take 15 mLs by mouth as needed for indigestion or heartburn.  [name] amLODipine (NORVASC) 5 MG tablet TAKE 1 TABLET BY MOUTH TWICE A Treavon Castilleja  . atorvastatin (LIPITOR) 80 MG tablet TAKE ONE TABLET BY MOUTH AT BEDTIME  . azelastine (ASTELIN) 0.1 % nasal spray Place 2 sprays into both nostrils at bedtime as needed for rhinitis or allergies.  [name] ELIQUIS 2.5 MG TABS tablet TAKE 1 TABLET BY MOUTH TWICE A Kymani Laursen  . esomeprazole (NEXIUM) 40 MG capsule Take 1 capsule (40 mg total) by mouth daily.  [name] ezetimibe (ZETIA) 10 MG tablet TAKE 1 TABLET BY MOUTH EVERY Nakaiya Beddow  . fenofibrate 160 MG tablet Take 1 tablet (160 mg total) by mouth daily.  . folic acid (FOLVITE) 1 MG tablet TAKE 2 TABLETS BY MOUTH EVERY Stefen Juba  . FREESTYLE LITE test strip USE TO TEST BLOOD SUGAR ONCE A Lavern Maslow. DX CODE: E11.9  . furosemide (LASIX) 40 MG tablet TAKE 3 TABLETS BY MOUTH 2 (TWO) TIMES DAILY.  [name] glimepiride (AMARYL) 1 MG tablet TAKE 1 TABLET (1 MG  TOTAL) BY MOUTH DAILY WITH BREAKFAST.  . hydrocortisone cream 1 % Apply 1 application topically 2 (two) times daily.  . Lancets (FREESTYLE) lancets USE TWICE A Breella Vanostrand TO CHECK BLOOD SUGAR. DX E11.9  . metFORMIN (GLUCOPHAGE) 1000 MG tablet TAKE 1 & 1/2 TABLETS BY MOUTH EVERY MORNING AND 1 TABLET IN THE EVENING.  . metoprolol tartrate (LOPRESSOR) 25 MG tablet TAKE ONE-HALF TABLET BY MOUTH TWICE DAILY  . nitroGLYCERIN (NITROSTAT) 0.4 MG SL tablet PLACE 1 TABLET (0.4 MG TOTAL) UNDER THE TONGUE EVERY 5 (FIVE) MINUTES AS NEEDED FOR CHEST PAIN.  [name] polycarbophil (FIBERCON) 625 MG tablet Take 625 mg by mouth daily.  . potassium chloride SA (KLOR-CON M20) 20 MEQ tablet Take 2 tablets (40 mEq total) by mouth 2 (two) times daily.  . pregabalin (LYRICA) 150 MG capsule TAKE 1 CAPSULE BY MOUTH TWICE A Leightyn Cina  . psyllium (METAMUCIL) 58.6 % powder Take 1 packet by mouth daily as needed (constipation).   . VASCEPA 1 g capsule TAKE 2 CAPSULES BY MOUTH TWICE A Michaiah Maiden  . [DISCONTINUED] doxycycline (VIBRAMYCIN) 100 MG capsule Take one cap PO Q12hr with food. (Patient not taking: Reported on 04/18/2020)   Facility-Administered Encounter Medications as of 04/13/2020  Medication  . sodium chloride flush (NS) 0.9 % injection 10 mL     Current Diagnosis/Assessment:  Goals Addressed            This Visit's Progress   . Chronic Care Management Pharmacy Care Plan  CARE PLAN ENTRY (see longitudinal plan of care for additional care plan information)  Current Barriers:  . Chronic Disease Management support, education, and care coordination needs related to Hypertension, Hyperlipidemia/CAD, Diabetes, AFib, Heart Failure, Neuropathy, Gout, GERD, Allergic Rhinitis   Hypertension BP Readings from Last 3 Encounters:  04/20/20 (!) 143/70  04/18/20 110/60  04/13/20 129/64   . Pharmacist Clinical Goal(s): o Over the next 90 days, patient will work with PharmD and providers to maintain BP goal <130/80 . Current regimen:   . Amlodipine 57m daily  . Metoprolol Tartrate 236m#0.5 tabs twice daily  . Interventions: o Discussed BP goal, diet, and exercise . Patient self care activities - Over the next 90 days, patient will: o Check BP 1-2 times per week, document, and provide at future appointments o Ensure daily salt intake < 2300 mg/Brena Windsor  Hyperlipidemia/CAD Lab Results  Component Value Date/Time   [facility] 09:56 AM     Comment:     . LDL cholesterol not calculated. Triglyceride levels greater than 400 mg/dL invalidate calculated LDL results. . Reference range: <100 . Desirable range <100 mg/dL for primary prevention;   <70 mg/dL for patients with CHD or diabetic patients  with > or = 2 CHD risk factors. . Marland KitchenDL-C is now calculated using the Martin-Hopkins  calculation, which is a validated novel method providing  better accuracy than the Friedewald equation in the  estimation of LDL-C.  MaCresenciano Genret al. JAAnnamaria [id number];[phone]  (http://education.[facility].com/faq/FAQ164)    LDLDIRECT 48.0 11/22/2019 10:37 AM   . Pharmacist Clinical Goal(s): o Over the next 180 days, patient will work with PharmD and providers to achieve LDL goal < 70 and TG <150 . Current regimen:  . Atorvastatin 8013maily . Ezetimibe 43m37mily . Fenofibrate 160mg61mly . Vascepa 1g #2 twice daily  . Nitroglycerin 0.4mg a67meeded . Interventions: o Discussed diet and exercise o Discussed risk associated with elevated triglycerides including pancreatitis  . Patient self care activities - Over the next 90 days, patient will: o Maintain cholesterol medication regimen.   Diabetes Lab Results  Component Value Date/Time   HGBA1C 7.0 (H) 04/18/2020 09:56 AM   HGBA1C 7.0 (H) 11/22/2019 10:37 AM   . Pharmacist Clinical Goal(s): o Over the next 90 days, patient will work with PharmD and providers to achieve A1c goal <7% . Current regimen:  . GlimMarland Kitchenpiride 1mg da16m  . Metformin 1000mg #118mabs AM, and  #1 tab PM . Interventions: o Discussed diet and exercise . Patient self care activities - Over the next 90 days, patient will: o Check blood sugar once daily, document, and provide at future appointments o Contact provider with any episodes of hypoglycemia  Heart Failure . Pharmacist Clinical Goal(s) o Over the next 90 days, patient will work with PharmD and providers to reduce symptoms associated with heart failure while balancing risk benefit of medication therapy . Current regimen:   Furosemide 40mg #3 84m by mouth twice daily  Potassium 20mEq #2 30me daily . Interventions: o Discussed kidney function labs o Discussed risk/benefit of furosemide use at high dosages . Patient self care activities - Over the next 90 days, patient will: o Follow up with nephrology and cardiology regarding furosemide dosing  Medication management . Pharmacist Clinical Goal(s): o Over the next 90 days, patient will work with PharmD and providers to maintain optimal medication adherence . [facility] . Interventions o Comprehensive medication review performed. o Continue current medication management strategy o Discussed  benefits of medication synchronization, packaging and delivery as well as enhanced pharmacist oversight with [employer]. . 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  Retired at 79 yrs old. 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 Divorced Has a life alert Daughter died 2 years ago.  Son lives in  [place]. Has 3 grandaughters.  Has a sister that lives in [place].  Lives alone.   Hypertension   BP goal is:  <130/80  Office blood pressures are  BP Readings from Last 3 Encounters:  04/20/20 (!) 143/70  04/18/20 110/60  04/13/20 129/64   Patient checks BP at home 1-2x per week Patient home BP readings are ranging: Unable to assess (states he has not had any BP >563 systolic or >89 diastolic)  Patient has failed these meds in the past: Benazepril, hctz (angioedema) Patient is currently controlled on the following medications:  . Amlodipine 91m daily  . Metoprolol Tartrate 217m#0.5 tabs twice daily   Diet Cutting sweets, only eating fried foods 1-2x/week, rarely eats sausage (~4x/year) States he is "hooked on cheese" B - cereal (cheerios) w/ 2% milk L - roasted tuKuwaiteat w/ cheese, dukes mayo, lettuce, and whole wheat bread D - Olympics - pork chop with salad Snacks - PB before bedtime, eats jalapeno potatoe chips occasionally  Drinks - Diet drinks (Dr. PeMalachi Bondsnd Sprite); drinks about 4 per Javelle Donigan; "a lot of water" (5-6 bottles per Anselm Aumiller)  Exercise Plays golf 3x/week Dose 60 leg raises and leg bends  We discussed diet and exercise extensively and BP goal   Plan -Continue current medications   Hyperlipidemia/CAD   LDL goal < 70  Lipid Panel     Component Value Date/Time   CHOL 177 04/18/2020 0956   CHOL 159 01/25/2020 0819   TRIG 745 (H) 04/18/2020 0956   HDL 30 (L) 04/18/2020 0956   HDL 31 (L) 01/25/2020 0819   LDGranite10/19/2021 0956     Comment:     . LDL cholesterol not calculated. Triglyceride levels greater than 400 mg/dL invalidate calculated LDL results. . Reference range: <100 . Desirable range <100 mg/dL for primary prevention;   <70 mg/dL for patients with CHD or diabetic patients  with > or = 2 CHD risk factors. . Marland KitchenDL-C is now calculated using the Martin-Hopkins  calculation, which is a validated novel method providing  better accuracy  than the Friedewald equation in the  estimation of LDL-C.  MaCresenciano Genret al. JAAnnamaria [phone]  (http://education.[facility].com/faq/FAQ164)    LDLDIRECT 48.0 11/22/2019 1037    Hepatic Function Latest Ref Rng & Units 04/18/2020 04/12/2020 03/16/2020  Total Protein 6.1 - 8.1 g/dL 6.9 6.7 6.6  Albumin 3.5 - 5.0 g/dL - 4.5 4.4  AST 10 - 35 U/L _0 ALT 9 - 46 U/L _1 Alk Phosphatase 38 - 126 U/L - 53 62  Total Bilirubin 0.2 - 1.2 mg/dL 0.6 0.5 0.6  Bilirubin, Direct 0.0 - 0.3 mg/dL - - -  The ASCVD Risk score Mikey Bussing DC Jr., et al., 2013) failed to calculate for the following reasons:   The 2013 ASCVD risk score is only valid for ages 23 to 2   The patient has a prior MI or stroke diagnosis   Patient has failed these meds in past: None noted  Patient is currently borderline controlled on the following medications:  . Atorvastatin 76m daily . Ezetimibe 176mdaily . Fenofibrate 16020maily . Vascepa 1g #2 twice daily  . Nitroglycerin 0.4mg28m needed  Patient unsure if VascDalene Seltzerworking for him. Discussed risks associated with elevated TRIG including pancreatitis  We discussed:  diet and exercise extensively  Plan -Continue current medications  Diabetes   A1c goal <7%  Recent Relevant Labs: Lab Results  Component Value Date/Time   HGBA1C 7.0 (H) 04/18/2020 09:56 AM   HGBA1C 7.0 (H) 11/22/2019 10:37 AM   GFR 37.32 (L) 11/22/2019 10:37 AM   GFR 41.39 (L) 08/10/2019 01:29 PM   MICROALBUR <0.2 04/18/2020 09:56 AM   MICROALBUR 0.7 08/10/2019 01:29 PM    Last diabetic Eye exam:  Lab Results  Component Value Date/Time   HMDIABEYEEXA No Retinopathy 05/21/2018 12:00 AM    Last diabetic Foot exam: No results found for: HMDIABFOOTEX   Checking BG: Daily  Recent FBG Readings: FBG this AM 180, 140 yesterday  Patient has failed these meds in past: None noted  Patient is currently controlled on the following medications: . Glimepiride 1mg 55mily  . Metformin 1000mg 69m tabs AM, and #1 tab PM  We discussed: A1c goal  Plan -Continue current medications    AFIB   Patient is currently rate controlled.  Patient has failed these meds in past: None noted  Patient is currently controlled on the following medications:   Eliquis 2.5mg tw83m daily   Metoprolol Tartrate 25mg #020mabs twice daily   Plan -Continue current medications   Heart Failure    Followed by Cardio (Hochrein)  Type: Diastolic  Last ejection fraction: 11/05/2018 50-55%  Patient has failed these meds in past: None noted  Patient is currently controlled on the following medications:  Furosemide 40mg #3 72m by mouth twice daily  Potassium 20mEq #2 26me daily  We discussed the dosing of his furosemide and how this is helpful for edema, but could also affect his kidneys. Discussed risk/benefit of furosemide use at high dosages Thoroughly explained the meaning of Creatinine and GFR. Pt was appreciative of explanation.  He is scheduled to see nephrologist in 2 weeks.   Plan -Assess furosemide dosing with nephrology and cardiology   Neuropathy    Patient has failed these meds in past: gabapentin (listed in D/C meds. No apparent reason for D/C) Patient is currently controlled on the following medications: . Pregabalin 150mg twice70mly  Feels "tipsy" after taking lyrica Discussed that this could be a possible side effect.  Pt denies this causing him to lose balance.   Plan -Continue current medications   Gout   Uric Acid, Serum  Date Value Ref Range Status  11/22/2019 6.2 4.0 - 7.8 mg/dL Final  08/30/2013 5.1 4.0 - 7.8 mg/dL Final   Uric Acid  Date Value Ref Range Status  03/20/2017 5.0 3.7 - 8.6 mg/dL Final    Comment:                              Therapeutic target for gout patients: <6.0  Goal Uric Acid < 6 mg/dL   Medications that may increase uric acid levels: Loop Diuretics   Last gout flare: years ago  Patient  has failed these meds in past: None noted  Patient is currently controlled on the following medications:  . Allopurinol 319m 1.5 tabs daily   Plan -Continue current medications   GERD   Patient has failed these meds in past: omeprazole (listed in D/C meds. No apparent reason for D/C) Patient is currently controlled on the following medications:  . Esomeprazole 457mdaily . Gaviscon as needed  Breakthrough Sx: When he misses a dose Breakthrough Tx: Gaviscon (takes with nearly every meal)   Plan -Continue current medications   Allergic Rhinitis    Patient has failed these meds in past: None noted  Patient is currently controlled on the following medications: . Azelastine 0.1% 2 sprays into both nostrils as needed  Plan -Continue current medications   Vaccines   Reviewed and discussed patient's vaccination history.   Vaccines UTD  Immunization History  Administered Date(s) Administered  . Influenza Split 06/12/2011, 05/31/2013  . Influenza Whole 05/01/2012  . Influenza, High Dose Seasonal PF 02/09/2015, 03/12/2017, 03/09/2018, 03/02/2020  . Influenza-Unspecified 02/11/2014, 02/29/2016, 02/25/2019  . Moderna SARS-COVID-2 Vaccination 08/30/2019, 09/30/2019  . Pneumococcal Conjugate-13 09/18/2015  . Pneumococcal Polysaccharide-23 06/20/2013  . Td 03/20/2010, 03/16/2013, 09/29/2017  . Zoster Recombinat (Shingrix) 11/21/2016, 09/25/2017    Medication Management   [facility] Uses pill box? Yes (Has an AM and PM pill box that he feels every Monday) Pt endorses 100% compliance  Routine: Wake, check sugar, take meds, eat cereal  Goes in donut hole in May.    Meds to D/C from list Doxycycline 10041mydrocortisone 1% cream Fibercon 625m32metamucil  We discussed: Discussed benefits of medication synchronization, packaging and delivery as well as enhanced pharmacist oversight with [employer]. Patient really appreciates his pharmacist  yet also sees the benefit of having is medication packaged and delivered to him. He will consider pending pricing.   Plan  Continue current medication management strategy   Follow up:  2 month phone visit  KaneDe BlancharmD Clinical Pharmacist [facility]-[phone]

## 2020-04-13 NOTE — Progress Notes (Addendum)
Chronic Care Management Pharmacy Assistant   Name: Richard [name]  MRN: [id number] DOB: [birth year]  Reason for Encounter: Medication Review/ Initial Questions for Pharmacist visit on 04-13-20 at 2 pm.    PCP : Carollee Herter, Alferd Apa, DO  Allergies:   Allergies  Allergen Reactions   Benazepril Swelling    angioedema; he is not a candidate for any angiotensin receptor blockers because of this significant allergic reaction. Because of a history of documented adverse serious drug reaction;Medi Alert bracelet  is recommended   Hctz [Hydrochlorothiazide] Anaphylaxis and Swelling    Tongue and lip swelling    Aspirin Other (See Comments)    Gastritis, cant take 325 Mg aspirin    Lactose Intolerance (Gi) Nausea And Vomiting    Medications: Outpatient Encounter Medications as of 04/13/2020  Medication Sig   allopurinol (ZYLOPRIM) 300 MG tablet Take 450 mg by mouth daily.    aluminum hydroxide-magnesium carbonate (GAVISCON) 95-358 MG/15ML SUSP Take 15 mLs by mouth as needed for indigestion or heartburn.   amLODipine (NORVASC) 5 MG tablet TAKE 1 TABLET BY MOUTH TWICE A DAY   atorvastatin (LIPITOR) 80 MG tablet TAKE ONE TABLET BY MOUTH AT BEDTIME   azelastine (ASTELIN) 0.1 % nasal spray Place 2 sprays into both nostrils at bedtime as needed for rhinitis or allergies.   doxycycline (VIBRAMYCIN) 100 MG capsule Take one cap PO Q12hr with food.   ELIQUIS 2.5 MG TABS tablet TAKE 1 TABLET BY MOUTH TWICE A DAY   esomeprazole (NEXIUM) 40 MG capsule Take 1 capsule (40 mg total) by mouth daily.   ezetimibe (ZETIA) 10 MG tablet TAKE 1 TABLET BY MOUTH EVERY DAY   fenofibrate 160 MG tablet Take 1 tablet (160 mg total) by mouth daily.   folic acid (FOLVITE) 1 MG tablet TAKE 2 TABLETS BY MOUTH EVERY DAY   FREESTYLE LITE test strip USE TO TEST BLOOD SUGAR ONCE A DAY. DX CODE: E11.9   furosemide (LASIX) 40 MG tablet TAKE 3 TABLETS BY MOUTH 2 (TWO) TIMES DAILY.   glimepiride (AMARYL) 1  MG tablet TAKE 1 TABLET (1 MG TOTAL) BY MOUTH DAILY WITH BREAKFAST.   hydrocortisone cream 1 % Apply 1 application topically 2 (two) times daily.   Lancets (FREESTYLE) lancets USE TWICE A DAY TO CHECK BLOOD SUGAR. DX E11.9   metFORMIN (GLUCOPHAGE) 1000 MG tablet TAKE 1 & 1/2 TABLETS BY MOUTH EVERY MORNING AND 1 TABLET IN THE EVENING.   metoprolol tartrate (LOPRESSOR) 25 MG tablet TAKE ONE-HALF TABLET BY MOUTH TWICE DAILY   nitroGLYCERIN (NITROSTAT) 0.4 MG SL tablet PLACE 1 TABLET (0.4 MG TOTAL) UNDER THE TONGUE EVERY 5 (FIVE) MINUTES AS NEEDED FOR CHEST PAIN.   polycarbophil (FIBERCON) 625 MG tablet Take 625 mg by mouth daily.   potassium chloride SA (KLOR-CON M20) 20 MEQ tablet Take 2 tablets (40 mEq total) by mouth 2 (two) times daily.   pregabalin (LYRICA) 150 MG capsule TAKE 1 CAPSULE BY MOUTH TWICE A DAY   psyllium (METAMUCIL) 58.6 % powder Take 1 packet by mouth daily as needed (constipation).    VASCEPA 1 g capsule TAKE 2 CAPSULES BY MOUTH TWICE A DAY   Facility-Administered Encounter Medications as of 04/13/2020  Medication   sodium chloride flush (NS) 0.9 % injection 10 mL    Current Diagnosis: Patient Active Problem List   Diagnosis Date Noted   Pain due to onychomycosis of toenails of both feet 12/14/2019   Educated about COVID-19 virus infection 11/25/2019   Uncontrolled  type 2 diabetes mellitus with hypoglycemia without coma ([place]) 08/10/2019   Hyperlipidemia associated with type 2 diabetes mellitus ([place]) 08/10/2019   S/P left TKA 06/29/2019   Peripheral neuropathy 07/27/2018   Uncontrolled type 2 diabetes mellitus with hyperglycemia ([place]) 05/13/2018   Diabetic peripheral neuropathy associated with type 2 diabetes mellitus ([place]) 05/13/2018   Pansinusitis 05/13/2018   Severe aortic stenosis    Complete heart block (HCC)    Paroxysmal atrial fibrillation (HCC)    Acute on chronic diastolic CHF (congestive heart failure), NYHA class 2 (HCC)    Left  arm swelling    Status post transcatheter aortic valve replacement (TAVR) using bioprosthesis 09/09/2017   Demand ischemia of myocardium (HCC)    Typical atrial flutter (HCC)    Acute on chronic diastolic heart failure (HCC)    GIB (gastrointestinal bleeding) 08/16/2017   Hyperlipidemia 08/05/2017   NSTEMI (non-ST elevated myocardial infarction) ([place])    Aortic stenosis, severe 07/25/2017   Pancytopenia ([place]) 07/24/2017   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 ([place]) 02/27/2017   Bradycardia 01/04/2017   Coronary artery disease 01/04/2017   Stenosis of carotid artery 01/04/2017   Obesity 09/18/2016   Wenckebach block    DM (diabetes mellitus) type II uncontrolled, periph vascular disorder ([place]) 05/21/2013   Spinal stenosis of lumbar region 04/08/2013   Anemia, iron deficiency 11/29/2011   B12 deficiency anemia 07/30/2011   OSA (obstructive sleep apnea) 07/06/2010   CAD, ARTERY BYPASS GRAFT 08/22/2009   Essential hypertension 03/29/2009   Bilateral lower extremity edema 02/21/2009   Hyperlipidemia LDL goal <70 11/26/2008    Goals Addressed   None    Have you seen any other providers since your last visit? Yes patient states he sees his cancer provider once a month. Any changes in your medications or health? No Any side effects from any medications? States when he gets up in the morning he feels great but after medication is in system he states he does not feel comfortable. Do you have an symptoms or problems not managed by your medications? Patient states he has been in the doughnut hole since May. Any concerns about your health right now?  Patient states his triglycerides are high and would like to get that under control. Has your provider asked that you check blood pressure, blood sugar, or follow special diet at home? States he checks blood pressure daily. Do you get any type of exercise on a regular basis? Patient states he plays golf  twice a week. Can you think of a goal you would like to reach for your health? Patient states his triglycerides are high and would like to get that under control and keep sugar balance. Do you have any problems getting your medications? No Patient states he has been in the doughnut hole since May.  [facility] [place] Is there anything that you would like to discuss during the appointment? Patient would like to know if he needs to take all the medication that he is on.  Patient is interested in PAP since he has been in the doughnut hole since May.  Patient states he is very excited about this appointment.  Documented preliminary medication plan in preparation for patients initial chronic care management visit  Advised patient to please bring medications and supplements to appointment on 04-13-20.  Patient stated since he has so much medication that he will bring a detail list of his medications.   Follow-Up:  Pharmacist Review   [place] Shannon,  RMA [facility] [place] Pharmacist Assistant [phone]  Reviewed by: De Blanch, PharmD Clinical Pharmacist [facility] [phone]

## 2020-04-17 ENCOUNTER — Other Ambulatory Visit: Payer: Self-pay

## 2020-04-17 DIAGNOSIS — E1165 Type 2 diabetes mellitus with hyperglycemia: Secondary | ICD-10-CM

## 2020-04-17 DIAGNOSIS — I10 Essential (primary) hypertension: Secondary | ICD-10-CM

## 2020-04-17 DIAGNOSIS — E781 Pure hyperglyceridemia: Secondary | ICD-10-CM

## 2020-04-17 DIAGNOSIS — N183 Chronic kidney disease, stage 3 unspecified: Secondary | ICD-10-CM

## 2020-04-18 ENCOUNTER — Other Ambulatory Visit: Payer: Self-pay

## 2020-04-18 ENCOUNTER — Telehealth: Payer: Self-pay | Admitting: Family

## 2020-04-18 ENCOUNTER — Ambulatory Visit (INDEPENDENT_AMBULATORY_CARE_PROVIDER_SITE_OTHER): Payer: Medicare Other | Admitting: Family Medicine

## 2020-04-18 ENCOUNTER — Encounter: Payer: Self-pay | Admitting: Family Medicine

## 2020-04-18 VITALS — BP 110/60 | HR 81 | Resp 18 | Ht 72.0 in | Wt 248.0 lb

## 2020-04-18 DIAGNOSIS — E1151 Type 2 diabetes mellitus with diabetic peripheral angiopathy without gangrene: Secondary | ICD-10-CM

## 2020-04-18 DIAGNOSIS — E1165 Type 2 diabetes mellitus with hyperglycemia: Secondary | ICD-10-CM | POA: Diagnosis not present

## 2020-04-18 DIAGNOSIS — N183 Chronic kidney disease, stage 3 unspecified: Secondary | ICD-10-CM | POA: Diagnosis not present

## 2020-04-18 DIAGNOSIS — I10 Essential (primary) hypertension: Secondary | ICD-10-CM

## 2020-04-18 DIAGNOSIS — E785 Hyperlipidemia, unspecified: Secondary | ICD-10-CM | POA: Diagnosis not present

## 2020-04-18 DIAGNOSIS — E781 Pure hyperglyceridemia: Secondary | ICD-10-CM | POA: Diagnosis not present

## 2020-04-18 DIAGNOSIS — I6523 Occlusion and stenosis of bilateral carotid arteries: Secondary | ICD-10-CM

## 2020-04-18 DIAGNOSIS — I5033 Acute on chronic diastolic (congestive) heart failure: Secondary | ICD-10-CM | POA: Diagnosis not present

## 2020-04-18 DIAGNOSIS — IMO0002 Reserved for concepts with insufficient information to code with codable children: Secondary | ICD-10-CM

## 2020-04-18 DIAGNOSIS — C8332 Diffuse large B-cell lymphoma, intrathoracic lymph nodes: Secondary | ICD-10-CM

## 2020-04-18 NOTE — Patient Instructions (Signed)
Carbohydrate Counting for Diabetes Mellitus, Adult  Carbohydrate counting is a method of keeping track of how many carbohydrates you eat. Eating carbohydrates naturally increases the amount of sugar (glucose) in the blood. Counting how many carbohydrates you eat helps keep your blood glucose within normal limits, which helps you manage your diabetes (diabetes mellitus). It is important to know how many carbohydrates you can safely have in each meal. This is different for every person. A diet and nutrition specialist (registered dietitian) can help you make a meal plan and calculate how many carbohydrates you should have at each meal and snack. Carbohydrates are found in the following foods:  Grains, such as breads and cereals.  Dried beans and soy products.  Starchy vegetables, such as potatoes, peas, and corn.  Fruit and fruit juices.  Milk and yogurt.  Sweets and snack foods, such as cake, cookies, candy, chips, and soft drinks. How do I count carbohydrates? There are two ways to count carbohydrates in food. You can use either of the methods or a combination of both. Reading "Nutrition Facts" on packaged food The "Nutrition Facts" list is included on the labels of almost all packaged foods and beverages in the [place] It includes:  The serving size.  Information about nutrients in each serving, including the grams (g) of carbohydrate per serving. To use the "Nutrition Facts":  Decide how many servings you will have.  Multiply the number of servings by the number of carbohydrates per serving.  The resulting number is the total amount of carbohydrates that you will be having. Learning standard serving sizes of other foods When you eat carbohydrate foods that are not packaged or do not include "Nutrition Facts" on the label, you need to measure the servings in order to count the amount of carbohydrates:  Measure the foods that you will eat with a food scale or measuring cup, if  needed.  Decide how many standard-size servings you will eat.  Multiply the number of servings by 15. Most carbohydrate-rich foods have about 15 g of carbohydrates per serving. ? For example, if you eat 8 oz (170 g) of strawberries, you will have eaten 2 servings and 30 g of carbohydrates (2 servings x 15 g = 30 g).  For foods that have more than one food mixed, such as soups and casseroles, you must count the carbohydrates in each food that is included. The following list contains standard serving sizes of common carbohydrate-rich foods. Each of these servings has about 15 g of carbohydrates:   hamburger bun or  English muffin.   oz (15 mL) syrup.   oz (14 g) jelly.  1 slice of bread.  1 six-inch tortilla.  3 oz (85 g) cooked rice or pasta.  4 oz (113 g) cooked dried beans.  4 oz (113 g) starchy vegetable, such as peas, corn, or potatoes.  4 oz (113 g) hot cereal.  4 oz (113 g) mashed potatoes or  of a large baked potato.  4 oz (113 g) canned or frozen fruit.  4 oz (120 mL) fruit juice.  4-6 crackers.  6 chicken nuggets.  6 oz (170 g) unsweetened dry cereal.  6 oz (170 g) plain fat-free yogurt or yogurt sweetened with artificial sweeteners.  8 oz (240 mL) milk.  8 oz (170 g) fresh fruit or one small piece of fruit.  24 oz (680 g) popped popcorn. Example of carbohydrate counting Sample meal  3 oz (85 g) chicken breast.  6 oz (170 g)   brown rice.  4 oz (113 g) corn.  8 oz (240 mL) milk.  8 oz (170 g) strawberries with sugar-free whipped topping. Carbohydrate calculation 1. Identify the foods that contain carbohydrates: ? Rice. ? Corn. ? Milk. ? Strawberries. 2. Calculate how many servings you have of each food: ? 2 servings rice. ? 1 serving corn. ? 1 serving milk. ? 1 serving strawberries. 3. Multiply each number of servings by 15 g: ? 2 servings rice x 15 g = 30 g. ? 1 serving corn x 15 g = 15 g. ? 1 serving milk x 15 g = 15 g. ? 1  serving strawberries x 15 g = 15 g. 4. Add together all of the amounts to find the total grams of carbohydrates eaten: ? 30 g + 15 g + 15 g + 15 g = 75 g of carbohydrates total. Summary  Carbohydrate counting is a method of keeping track of how many carbohydrates you eat.  Eating carbohydrates naturally increases the amount of sugar (glucose) in the blood.  Counting how many carbohydrates you eat helps keep your blood glucose within normal limits, which helps you manage your diabetes.  A diet and nutrition specialist (registered dietitian) can help you make a meal plan and calculate how many carbohydrates you should have at each meal and snack. This information is not intended to replace advice given to you by your health care provider. Make sure you discuss any questions you have with your health care provider. Document Revised: 01/09/2017 Document Reviewed: 11/29/2015 Elsevier Patient Education  2020 [employer]  

## 2020-04-18 NOTE — Assessment & Plan Note (Addendum)
Tolerating statin, encouraged heart healthy diet, avoid trans fats, minimize simple carbs and saturated fats. Increase exercise as tolerated con't lipitor , zetia and fenofibrate

## 2020-04-18 NOTE — Assessment & Plan Note (Signed)
Well controlled, no changes to meds. Encouraged  heart healthy diet such as the DASH diet and exercise as tolerated.  con't metoprolol

## 2020-04-18 NOTE — Assessment & Plan Note (Signed)
con't lasix  F/u cardiology

## 2020-04-18 NOTE — Assessment & Plan Note (Signed)
Per oncology °

## 2020-04-18 NOTE — Telephone Encounter (Signed)
Appointments scheduled and I spoke with patient per 10/18 sch msg

## 2020-04-18 NOTE — Progress Notes (Signed)
Patient ID: Richard [name], male    DOB: 06-19-1941  Age: 79 y.o. MRN: [id number]    Subjective:  Subjective  HPI Richard [name] presents for f/u dm , chol and bp/ chf/   He saw Cardiology in July.   He will see nephrology next week.  He had a sore throat and queezy stomach last week but that has almost completely resolved.  No fevers     HYPERTENSION   Blood pressure range-not checking   Chest pain- no      Dyspnea- no Lightheadedness- no   Edema- no  Other side effects - good   Medication compliance: good Low salt diet- yes     DIABETES    Blood Sugar ranges-160-170  Polyuria- no New Visual problems- no  Hypoglycemic symptoms- no  Other side effects-no Medication compliance - good Last eye exam- due Foot exam- podiatry ,  neuropathy   HYPERLIPIDEMIA  Medication compliance- good RUQ pain- no  Muscle aches- no Other side effects-no    Review of Systems  Constitutional: Negative for appetite change, diaphoresis, fatigue and unexpected weight change.  Eyes: Negative for pain, redness and visual disturbance.  Respiratory: Negative for cough, chest tightness, shortness of breath and wheezing.   Cardiovascular: Negative for chest pain, palpitations and leg swelling.  Endocrine: Negative for cold intolerance, heat intolerance, polydipsia, polyphagia and polyuria.  Genitourinary: Negative for difficulty urinating, dysuria and frequency.  Neurological: Negative for dizziness, light-headedness, numbness and headaches.    History Past Medical History:  Diagnosis Date  . Anemia   . Arthritis    hips  . Axillary adenopathy 02/25/2017  . Bradycardia    a. holter monitor has demonstrated HRs in 30s and [place]   . CAD (coronary artery disease)    a. s/p CABG 2001  b.  07/28/2017 cath:   Severe three-vessel native CAD with total occlusion of LAD, ramus intermedius, first OM and RCA, patent RIMA to PDA, LIMA to LAD, sequential SVG to ramus intermedius and first OM.    [name]  Chronic lower back pain   . Diffuse large B cell lymphoma ([place])   . Diverticulosis   . Esophageal stricture   . GERD (gastroesophageal reflux disease)   . History of gout   . HTN (hypertension)   . Mixed hyperlipidemia   . OSA on CPAP    with 2L O2 at night  . Pancytopenia ([place])    a. related to chemo therapy for B cell lymphoma  . Peptic stricture of esophagus   . Presence of permanent cardiac pacemaker    sees Dr. Valaria Good pacemaker  . Severe aortic stenosis   . Spinal stenosis   . Squamous cell carcinoma of skin 03/22/2009   Right mandible. SCCis, hypertrophic.   . Type II diabetes mellitus ([place])   . Wears dentures    partial upper    He has a past surgical history that includes Vasectomy; Coronary artery bypass graft (03/2000); Esophageal dilation (X 3-4); Esophagogastroduodenoscopy; Flexible sigmoidoscopy; Panendoscopy; Knee arthroscopy (Left, 2011); Shoulder surgery (Right, 08/2010); Colonoscopy w/ biopsies and polypectomy (2013); Upper gi endoscopy (2013); Myelogram (04/06/13); Lumbar laminectomy/decompression microdiscectomy (Right, 06/17/2013); Back surgery; Appendectomy (~ 1952); Tonsillectomy (~ 1954); Coronary angioplasty (1993); Coronary angioplasty with stent (05/1997); Skin cancer excision (Right); left heart catheterization with coronary/graft angiogram (N/A, 03/16/2014); Cataract extraction w/PHACO (Left, 11/06/2016); Cataract extraction w/PHACO (Right, 12/04/2016); Hydradenitis excision (Left, 02/25/2017); Portacath placement (N/A, 03/06/2017); RIGHT/LEFT HEART CATH AND CORONARY/GRAFT ANGIOGRAPHY (N/A, 07/28/2017); Transcatheter aortic valve replacement, transfemoral (N/A,  09/09/2017); Intraoperative transthoracic echocardiogram (N/A, 09/09/2017); PACEMAKER IMPLANT (N/A, 09/10/2017); Orbital lesion excision (Right, 09/01/2018); Ectropion repair (Right, 09/01/2018); and Total knee arthroplasty (Left, 06/29/2019).   His family history includes Diabetes in his maternal grandmother; Heart  attack in his paternal grandmother; Hypertension in his father; Pancreatic cancer in his mother; Stroke in his father and maternal grandmother.He reports that he has never smoked. He has never used smokeless tobacco. He reports that he does not drink alcohol and does not use drugs.  Current Outpatient Medications on File Prior to Visit  Medication Sig Dispense Refill  . allopurinol (ZYLOPRIM) 300 MG tablet Take 450 mg by mouth daily.     [name] aluminum hydroxide-magnesium carbonate (GAVISCON) 95-358 MG/15ML SUSP Take 15 mLs by mouth as needed for indigestion or heartburn.    [name] amLODipine (NORVASC) 5 MG tablet TAKE 1 TABLET BY MOUTH TWICE A DAY 180 tablet 3  . atorvastatin (LIPITOR) 80 MG tablet TAKE ONE TABLET BY MOUTH AT BEDTIME 90 tablet 3  . azelastine (ASTELIN) 0.1 % nasal spray Place 2 sprays into both nostrils at bedtime as needed for rhinitis or allergies. 30 mL 3  . ELIQUIS 2.5 MG TABS tablet TAKE 1 TABLET BY MOUTH TWICE A DAY 180 tablet 1  . esomeprazole (NEXIUM) 40 MG capsule Take 1 capsule (40 mg total) by mouth daily. 30 capsule 5  . ezetimibe (ZETIA) 10 MG tablet TAKE 1 TABLET BY MOUTH EVERY DAY 90 tablet 3  . fenofibrate 160 MG tablet Take 1 tablet (160 mg total) by mouth daily. 90 tablet 0  . folic acid (FOLVITE) 1 MG tablet TAKE 2 TABLETS BY MOUTH EVERY DAY 180 tablet 4  . FREESTYLE LITE test strip USE TO TEST BLOOD SUGAR ONCE A DAY. DX CODE: E11.9 50 strip 1  . furosemide (LASIX) 40 MG tablet TAKE 3 TABLETS BY MOUTH 2 (TWO) TIMES DAILY. 540 tablet 2  . glimepiride (AMARYL) 1 MG tablet TAKE 1 TABLET (1 MG TOTAL) BY MOUTH DAILY WITH BREAKFAST. 90 tablet 1  . hydrocortisone cream 1 % Apply 1 application topically 2 (two) times daily. 45 g 1  . Lancets (FREESTYLE) lancets USE TWICE A DAY TO CHECK BLOOD SUGAR. DX E11.9 100 each 6  . metFORMIN (GLUCOPHAGE) 1000 MG tablet TAKE 1 & 1/2 TABLETS BY MOUTH EVERY MORNING AND 1 TABLET IN THE EVENING. 225 tablet 0  . metoprolol tartrate (LOPRESSOR)  25 MG tablet TAKE ONE-HALF TABLET BY MOUTH TWICE DAILY 90 tablet 3  . nitroGLYCERIN (NITROSTAT) 0.4 MG SL tablet PLACE 1 TABLET (0.4 MG TOTAL) UNDER THE TONGUE EVERY 5 (FIVE) MINUTES AS NEEDED FOR CHEST PAIN. 25 tablet 6  . polycarbophil (FIBERCON) 625 MG tablet Take 625 mg by mouth daily.    . potassium chloride SA (KLOR-CON M20) 20 MEQ tablet Take 2 tablets (40 mEq total) by mouth 2 (two) times daily. 360 tablet 3  . pregabalin (LYRICA) 150 MG capsule TAKE 1 CAPSULE BY MOUTH TWICE A DAY 180 capsule 1  . psyllium (METAMUCIL) 58.6 % powder Take 1 packet by mouth daily as needed (constipation).     . VASCEPA 1 g capsule TAKE 2 CAPSULES BY MOUTH TWICE A DAY 120 capsule 5   Current Facility-Administered Medications on File Prior to Visit  Medication Dose Route Frequency Provider Last Rate Last Admin  . sodium chloride flush (NS) 0.9 % injection 10 mL  10 mL Intravenous PRN [name], MD   10 mL at 05/30/17 0920     Objective:  Objective  Physical Exam Vitals and nursing note reviewed.  Constitutional:      General: He is sleeping.     Appearance: He is well-developed.  HENT:     Head: Normocephalic and atraumatic.  Eyes:     Pupils: Pupils are equal, round, and reactive to light.  Neck:     Thyroid: No thyromegaly.  Cardiovascular:     Rate and Rhythm: Normal rate and regular rhythm.     Heart sounds: No murmur heard.   Pulmonary:     Effort: Pulmonary effort is normal. No respiratory distress.     Breath sounds: Normal breath sounds. No wheezing or rales.  Chest:     Chest wall: No tenderness.  Musculoskeletal:        General: No tenderness.     Cervical back: Normal range of motion and neck supple.  Skin:    General: Skin is warm and dry.  Neurological:     Mental Status: He is oriented to person, place, and time.  Psychiatric:        Behavior: Behavior normal.        Thought Content: Thought content normal.        Judgment: Judgment normal.    BP 110/60 (BP  Location: Right Arm, Patient Position: Sitting, Cuff Size: Large)   Pulse 81   Resp 18   Ht 6' (1.829 m)   Wt 248 lb (112.5 kg)   SpO2 95%   BMI 33.63 kg/m  Wt Readings from Last 3 Encounters:  04/18/20 248 lb (112.5 kg)  04/12/20 250 lb (113.4 kg)  03/16/20 255 lb 1.9 oz (115.7 kg)     Lab Results  Component Value Date   WBC 7.6 04/12/2020   HGB 12.7 (L) 04/12/2020   HCT 39.1 04/12/2020   PLT 177 04/12/2020   GLUCOSE 147 (H) 04/12/2020   CHOL 159 01/25/2020   TRIG 342 (H) 01/25/2020   HDL 31 (L) 01/25/2020   LDLDIRECT 48.0 11/22/2019   LDLCALC 73 01/25/2020   ALT 24 04/12/2020   AST 22 04/12/2020   NA 139 04/12/2020   K 4.1 04/12/2020   CL 101 04/12/2020   CREATININE 1.78 (H) 04/12/2020   BUN 44 (H) 04/12/2020   CO2 27 04/12/2020   TSH 2.450 07/27/2018   PSA 1.11 11/22/2019   INR 1.15 11/15/2017   HGBA1C 7.0 (H) 11/22/2019   MICROALBUR 0.7 08/10/2019    No results found.   Assessment & Plan:  Plan  I have discontinued Jaben L. Hibler's doxycycline. I am also having him maintain his allopurinol, esomeprazole, polycarbophil, psyllium, azelastine, aluminum hydroxide-magnesium carbonate, freestyle, Eliquis, furosemide, atorvastatin, amLODipine, nitroGLYCERIN, metoprolol tartrate, ezetimibe, FREESTYLE LITE, pregabalin, hydrocortisone cream, metFORMIN, fenofibrate, glimepiride, potassium chloride SA, Vascepa, and folic acid.  No orders of the defined types were placed in this encounter.   Problem List Items Addressed This Visit      Unprioritized   Acute on chronic diastolic CHF (congestive heart failure), NYHA class 2 (HCC)    con't lasix  F/u cardiology       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 ([place])    Per oncology      DM (diabetes mellitus) type II uncontrolled, periph vascular disorder ([place])    hgba1c to be checked , minimize simple carbs. Increase exercise as tolerated. Continue current meds       Essential hypertension     Well controlled, no changes to meds. Encouraged  heart healthy diet such as  the DASH diet and exercise as tolerated.  con't metoprolol       Hyperlipidemia LDL goal <70    Tolerating statin, encouraged heart healthy diet, avoid trans fats, minimize simple carbs and saturated fats. Increase exercise as tolerated con't lipitor , zetia and fenofibrate       Uncontrolled type 2 diabetes mellitus with hyperglycemia (HCC)   Relevant Orders   Comprehensive metabolic panel   Hemoglobin A1c   Microalbumin / creatinine urine ratio    Other Visit Diagnoses    Hypertriglyceridemia    -  Primary   Relevant Orders   Lipid panel   Hypertension, unspecified type       Relevant Orders   Hemoglobin A1c   Microalbumin / creatinine urine ratio   Chronic renal insufficiency, stage 3 (moderate) (HCC)       Relevant Orders   Comprehensive metabolic panel      Follow-up: Return in about 6 months (around 10/17/2020), or if symptoms worsen or fail to improve, for hypertension, hyperlipidemia, diabetes II.  [name], DO

## 2020-04-18 NOTE — Assessment & Plan Note (Signed)
hgba1c to be checked, minimize simple carbs. Increase exercise as tolerated. Continue current meds  

## 2020-04-19 LAB — COMPREHENSIVE METABOLIC PANEL
AG Ratio: 2.1 (calc) (ref 1.0–2.5)
ALT: 23 U/L (ref 9–46)
AST: 21 U/L (ref 10–35)
Albumin: 4.7 g/dL (ref 3.6–5.1)
Alkaline phosphatase (APISO): 65 U/L (ref 35–144)
BUN/Creatinine Ratio: 15 (calc) (ref 6–22)
BUN: 25 mg/dL (ref 7–25)
CO2: 27 mmol/L (ref 20–32)
Calcium: 9.7 mg/dL (ref 8.6–10.3)
Chloride: 98 mmol/L (ref 98–110)
Creat: 1.65 mg/dL — ABNORMAL HIGH (ref 0.70–1.11)
Globulin: 2.2 g/dL (calc) (ref 1.9–3.7)
Glucose, Bld: 171 mg/dL — ABNORMAL HIGH (ref 65–99)
Potassium: 4.3 mmol/L (ref 3.5–5.3)
Sodium: 138 mmol/L (ref 135–146)
Total Bilirubin: 0.6 mg/dL (ref 0.2–1.2)
Total Protein: 6.9 g/dL (ref 6.1–8.1)

## 2020-04-19 LAB — LIPID PANEL
Cholesterol: 177 mg/dL (ref <200)
HDL: 30 mg/dL — ABNORMAL LOW (ref >=40)
Non-HDL Cholesterol (Calc): 147 mg/dL (calc) — ABNORMAL HIGH (ref <130)
Total CHOL/HDL Ratio: 5.9 (calc) — ABNORMAL HIGH (ref <5.0)
Triglycerides: 745 mg/dL — ABNORMAL HIGH (ref <150)

## 2020-04-19 LAB — HEMOGLOBIN A1C
Hgb A1c MFr Bld: 7 % of total Hgb — ABNORMAL HIGH (ref <5.7)
Mean Plasma Glucose: 154 (calc)
eAG (mmol/L): 8.5 (calc)

## 2020-04-19 LAB — MICROALBUMIN / CREATININE URINE RATIO
Creatinine, Urine: 51 mg/dL (ref 20–320)
Microalb, Ur: <0.2 mg/dL

## 2020-04-20 ENCOUNTER — Other Ambulatory Visit: Payer: Self-pay

## 2020-04-20 ENCOUNTER — Inpatient Hospital Stay: Payer: Medicare Other

## 2020-04-20 VITALS — BP 143/70 | HR 83 | Temp 98.2°F | Resp 16

## 2020-04-20 DIAGNOSIS — D510 Vitamin B12 deficiency anemia due to intrinsic factor deficiency: Secondary | ICD-10-CM | POA: Diagnosis not present

## 2020-04-20 DIAGNOSIS — Z886 Allergy status to analgesic agent status: Secondary | ICD-10-CM | POA: Diagnosis not present

## 2020-04-20 DIAGNOSIS — D519 Vitamin B12 deficiency anemia, unspecified: Secondary | ICD-10-CM

## 2020-04-20 DIAGNOSIS — G629 Polyneuropathy, unspecified: Secondary | ICD-10-CM | POA: Diagnosis not present

## 2020-04-20 DIAGNOSIS — D500 Iron deficiency anemia secondary to blood loss (chronic): Secondary | ICD-10-CM | POA: Diagnosis not present

## 2020-04-20 DIAGNOSIS — D508 Other iron deficiency anemias: Secondary | ICD-10-CM

## 2020-04-20 DIAGNOSIS — C8338 Diffuse large B-cell lymphoma, lymph nodes of multiple sites: Secondary | ICD-10-CM | POA: Diagnosis not present

## 2020-04-20 MED ORDER — SODIUM CHLORIDE 0.9 % IV SOLN
510.0000 mg | Freq: Once | INTRAVENOUS | Status: AC
  Administered 2020-04-20: 510 mg via INTRAVENOUS
  Filled 2020-04-20: qty 510

## 2020-04-20 MED ORDER — SODIUM CHLORIDE 0.9 % IV SOLN
INTRAVENOUS | Status: DC
  Filled 2020-04-20: qty 250

## 2020-04-20 NOTE — Patient Instructions (Signed)

## 2020-04-20 NOTE — Progress Notes (Signed)
Pt discharged in no apparent distress. Declined post-observation. Pt left ambulatory without assistance. Pt aware of discharge instructions and verbalized understanding and had no further questions.

## 2020-04-21 DIAGNOSIS — Z23 Encounter for immunization: Secondary | ICD-10-CM | POA: Diagnosis not present

## 2020-04-27 DIAGNOSIS — I129 Hypertensive chronic kidney disease with stage 1 through stage 4 chronic kidney disease, or unspecified chronic kidney disease: Secondary | ICD-10-CM | POA: Diagnosis not present

## 2020-04-27 DIAGNOSIS — M899 Disorder of bone, unspecified: Secondary | ICD-10-CM | POA: Diagnosis not present

## 2020-04-27 DIAGNOSIS — N1832 Chronic kidney disease, stage 3b: Secondary | ICD-10-CM | POA: Diagnosis not present

## 2020-04-27 DIAGNOSIS — R609 Edema, unspecified: Secondary | ICD-10-CM | POA: Diagnosis not present

## 2020-04-27 DIAGNOSIS — D649 Anemia, unspecified: Secondary | ICD-10-CM | POA: Diagnosis not present

## 2020-04-30 NOTE — Patient Instructions (Signed)
Visit Information  Goals Addressed            This Visit's Progress    Chronic Care Management Pharmacy Care Plan       CARE PLAN ENTRY (see longitudinal plan of care for additional care plan information)  Current Barriers:   Chronic Disease Management support, education, and care coordination needs related to Hypertension, Hyperlipidemia/CAD, Diabetes, AFib, Heart Failure, Neuropathy, Gout, GERD, Allergic Rhinitis   Hypertension BP Readings from Last 3 Encounters:  04/20/20 (!) 143/70  04/18/20 110/60  04/13/20 129/64    Pharmacist Clinical Goal(s): o Over the next 90 days, patient will work with PharmD and providers to maintain BP goal <130/80  Current regimen:   Amlodipine 5mg  daily   Metoprolol Tartrate 25mg  #0.5 tabs twice daily   Interventions: o Discussed BP goal, diet, and exercise  Patient self care activities - Over the next 90 days, patient will: o Check BP 1-2 times per week, document, and provide at future appointments o Ensure daily salt intake < 2300 mg/[place]  Hyperlipidemia/CAD Lab Results  Component Value Date/Time   Richard [name]  04/18/2020 09:56 AM     Comment:     . LDL cholesterol not calculated. Triglyceride levels greater than 400 mg/dL invalidate calculated LDL results. . Reference range: <100 . Desirable range <100 mg/dL for primary prevention;   <70 mg/dL for patients with CHD or diabetic patients  with > or = 2 CHD risk factors. [name] LDL-C is now calculated using the Richard [name]  calculation, which is a validated novel method providing  better accuracy than the Friedewald equation in the  estimation of LDL-C.  Richard [name] et al. [name]. 0347;425(95): 2061-2068  (http://education.[facility].com/faq/FAQ164)    LDLDIRECT 48.0 11/22/2019 10:37 AM    Pharmacist Clinical Goal(s): o Over the next 180 days, patient will work with PharmD and providers to achieve LDL goal < 70 and TG <150  Current regimen:   Atorvastatin 80mg   daily  Ezetimibe 10mg  daily  Fenofibrate 160mg  daily  Vascepa 1g #2 twice daily   Nitroglycerin 0.4mg  as needed  Interventions: o Discussed diet and exercise o Discussed risk associated with elevated triglycerides including pancreatitis   Patient self care activities - Over the next 90 days, patient will: o Maintain cholesterol medication regimen.   Diabetes Lab Results  Component Value Date/Time   HGBA1C 7.0 (H) 04/18/2020 09:56 AM   HGBA1C 7.0 (H) 11/22/2019 10:37 AM    Pharmacist Clinical Goal(s): o Over the next 90 days, patient will work with PharmD and providers to achieve A1c goal <7%  Current regimen:   Glimepiride 1mg  daily   Metformin 1000mg  #1.5 tabs AM, and #1 tab PM  Interventions: o Discussed diet and exercise  Patient self care activities - Over the next 90 days, patient will: o Check blood sugar once daily, document, and provide at future appointments o Contact provider with any episodes of hypoglycemia  Heart Failure  Pharmacist Clinical Goal(s) o Over the next 90 days, patient will work with PharmD and providers to reduce symptoms associated with heart failure while balancing risk benefit of medication therapy  Current regimen:   Furosemide 40mg  #3 tabs by mouth twice daily  Potassium 51mEq #2 twice daily  Interventions: o Discussed kidney function labs o Discussed risk/benefit of furosemide use at high dosages  Patient self care activities - Over the next 90 days, patient will: o Follow up with nephrology and cardiology regarding furosemide dosing  Medication management  Pharmacist Clinical Goal(s): o Over the next  90 days, patient will work with PharmD and providers to maintain optimal medication adherence  Current pharmacy: [facility]  Interventions o Comprehensive medication review performed. o Continue current medication management strategy o Discussed benefits of medication synchronization, packaging and delivery as well as enhanced  pharmacist oversight with [employer].  Patient self care activities - Over the next 90 days, patient will: o Focus on medication adherence by filling and taking medications appropriately  o Take medications as prescribed o Report any questions or concerns to PharmD and/or provider(s) o Consider benefits of medication packaging and delivery through [employer]  Initial goal documentation        Richard [name] was given information about Chronic Care Management services today including:  1. CCM service includes personalized support from designated clinical staff supervised by his physician, including individualized plan of care and coordination with other care providers 2. 24/7 contact phone numbers for assistance for urgent and routine care needs. 3. Standard insurance, coinsurance, copays and deductibles apply for chronic care management only during months in which we provide at least 20 minutes of these services. Most insurances cover these services at 100%, however patients may be responsible for any copay, coinsurance and/or deductible if applicable. This service may help you avoid the need for more expensive face-to-face services. 4. Only one practitioner may furnish and bill the service in a calendar month. 5. The patient may stop CCM services at any time (effective at the end of the month) by phone call to the office staff.  Patient agreed to services and verbal consent obtained.   The patient verbalized understanding of instructions provided today and agreed to receive a mailed copy of patient instruction and/or educational materials. Telephone follow up appointment with pharmacy team member scheduled for: 06/14/2020  Melvenia Beam Jewell Haught, PharmD Clinical Pharmacist [facility] [phone]   Chronic Kidney Disease, Adult Chronic kidney disease (CKD) happens when the kidneys are damaged over a long period of time. The kidneys are two organs that help  with:  Getting rid of waste and extra fluid from the blood.  Making hormones that maintain the amount of fluid in your tissues and blood vessels.  Making sure that the body has the right amount of fluids and chemicals. Most of the time, CKD does not go away, but it can usually be controlled. Steps must be taken to slow down the kidney damage or to stop it from getting worse. If this is not done, the kidneys may stop working. Follow these instructions at home: Medicines  Take over-the-counter and prescription medicines only as told by your doctor. You may need to change the amount of medicines you take.  Do not take any new medicines unless your doctor says it is okay. Many medicines can make your kidney damage worse.  Do not take any vitamin and supplements unless your doctor says it is okay. Many vitamins and supplements can make your kidney damage worse. General instructions  Follow a diet as told by your doctor. You may need to stay away from: ? Alcohol. ? Salty foods. ? Foods that are high in:  Potassium.  Calcium.  Protein.  Do not use any products that contain nicotine or tobacco, such as cigarettes and e-cigarettes. If you need help quitting, ask your doctor.  Keep track of your blood pressure at home. Tell your doctor about any changes.  If you have diabetes, keep track of your blood sugar as told by your doctor.  Try to stay at a  healthy weight. If you need help, ask your doctor.  Exercise at least 30 minutes a [place], 5 days a week.  Stay up-to-date with your shots (immunizations) as told by your doctor.  Keep all follow-up visits as told by your doctor. This is important. Contact a doctor if:  Your symptoms get worse.  You have new symptoms. Get help right away if:  You have symptoms of end-stage kidney disease. These may include: ? Headaches. ? Numbness in your hands or feet. ? Easy bruising. ? Having hiccups often. ? Chest pain. ? Shortness of  breath. ? Stopping of menstrual periods in women.  You have a fever.  You have very little pee (urine).  You have pain or bleeding when you pee. Summary  Chronic kidney disease (CKD) happens when the kidneys are damaged over a long period of time.  Most of the time, this condition does not go away, but it can usually be controlled. Steps must be taken to slow down the kidney damage or to stop it from getting worse.  Treatment may include a combination of medicines and lifestyle changes. This information is not intended to replace advice given to you by your health care provider. Make sure you discuss any questions you have with your health care provider. Document Revised: 05/30/2017 Document Reviewed: 07/22/2016 Elsevier Patient Education  2020 [employer].

## 2020-05-01 ENCOUNTER — Other Ambulatory Visit: Payer: Self-pay | Admitting: Family Medicine

## 2020-05-01 ENCOUNTER — Other Ambulatory Visit: Payer: Self-pay | Admitting: Nephrology

## 2020-05-01 DIAGNOSIS — E1165 Type 2 diabetes mellitus with hyperglycemia: Secondary | ICD-10-CM

## 2020-05-01 DIAGNOSIS — IMO0002 Reserved for concepts with insufficient information to code with codable children: Secondary | ICD-10-CM

## 2020-05-01 DIAGNOSIS — N1832 Chronic kidney disease, stage 3b: Secondary | ICD-10-CM

## 2020-05-01 DIAGNOSIS — N189 Chronic kidney disease, unspecified: Secondary | ICD-10-CM

## 2020-05-01 DIAGNOSIS — R609 Edema, unspecified: Secondary | ICD-10-CM

## 2020-05-03 ENCOUNTER — Other Ambulatory Visit: Payer: Self-pay | Admitting: Family Medicine

## 2020-05-05 ENCOUNTER — Ambulatory Visit (INDEPENDENT_AMBULATORY_CARE_PROVIDER_SITE_OTHER): Payer: Medicare Other

## 2020-05-05 DIAGNOSIS — I442 Atrioventricular block, complete: Secondary | ICD-10-CM

## 2020-05-05 LAB — CUP PACEART REMOTE DEVICE CHECK
Battery Remaining Longevity: 36 mo
Battery Voltage: 2.94 V
Brady Statistic AP VP Percent: 12.17 %
Brady Statistic AP VS Percent: 0 %
Brady Statistic AS VP Percent: 87.75 %
Brady Statistic AS VS Percent: 0.08 %
Brady Statistic RA Percent Paced: 12.17 %
Brady Statistic RV Percent Paced: 99.92 %
Date Time Interrogation Session: 20211105065527
Implantable Lead Implant Date: 20190313
Implantable Lead Implant Date: 20190313
Implantable Lead Location: 753859
Implantable Lead Location: 753860
Implantable Lead Model: 3830
Implantable Lead Model: 5076
Implantable Pulse Generator Implant Date: 20190313
Lead Channel Impedance Value: 304 Ohm
Lead Channel Impedance Value: 323 Ohm
Lead Channel Impedance Value: 361 Ohm
Lead Channel Impedance Value: 418 Ohm
Lead Channel Pacing Threshold Amplitude: 0.875 V
Lead Channel Pacing Threshold Amplitude: 1.125 V
Lead Channel Pacing Threshold Pulse Width: 0.4 ms
Lead Channel Pacing Threshold Pulse Width: 0.4 ms
Lead Channel Sensing Intrinsic Amplitude: 1.75 mV
Lead Channel Sensing Intrinsic Amplitude: 1.75 mV
Lead Channel Sensing Intrinsic Amplitude: 4.5 mV
Lead Channel Sensing Intrinsic Amplitude: 4.5 mV
Lead Channel Setting Pacing Amplitude: 1.75 V
Lead Channel Setting Pacing Amplitude: 3.5 V
Lead Channel Setting Pacing Pulse Width: 1 ms
Lead Channel Setting Sensing Sensitivity: 1.2 mV

## 2020-05-05 NOTE — Progress Notes (Signed)
Remote pacemaker transmission.   

## 2020-05-10 ENCOUNTER — Telehealth: Payer: Self-pay | Admitting: Pharmacist

## 2020-05-10 NOTE — Progress Notes (Signed)
Chronic Care Management Pharmacy Assistant   Name: Richard [name]  MRN: [id number] DOB: 1940-10-20  Reason for Encounter: Medication Review  PCP : [name], DO   Their chronic conditions include: Hypertension, Hyperlipidemia/CAD, Diabetes, AFib, Heart Failure, Neuropathy, Gout, GERD, Allergic Rhinitis  Allergies:   Allergies  Allergen Reactions  . Benazepril Swelling    angioedema; he is not a candidate for any angiotensin receptor blockers because of this significant allergic reaction. Because of a history of documented adverse serious drug reaction;Medi Alert bracelet  is recommended  . Hctz [Hydrochlorothiazide] Anaphylaxis and Swelling    Tongue and lip swelling   . Aspirin Other (See Comments)    Gastritis, cant take 325 Mg aspirin   . Lactose Intolerance (Gi) Nausea And Vomiting    Medications: Outpatient Encounter Medications as of 05/10/2020  Medication Sig  . allopurinol (ZYLOPRIM) 300 MG tablet Take 450 mg by mouth daily.   [name] aluminum hydroxide-magnesium carbonate (GAVISCON) 95-358 MG/15ML SUSP Take 15 mLs by mouth as needed for indigestion or heartburn.  [name] amLODipine (NORVASC) 5 MG tablet TAKE 1 TABLET BY MOUTH TWICE A DAY  . atorvastatin (LIPITOR) 80 MG tablet TAKE ONE TABLET BY MOUTH AT BEDTIME  . azelastine (ASTELIN) 0.1 % nasal spray Place 2 sprays into both nostrils at bedtime as needed for rhinitis or allergies.  [name] ELIQUIS 2.5 MG TABS tablet TAKE 1 TABLET BY MOUTH TWICE A DAY  . esomeprazole (NEXIUM) 40 MG capsule Take 1 capsule (40 mg total) by mouth daily.  [name] ezetimibe (ZETIA) 10 MG tablet TAKE 1 TABLET BY MOUTH EVERY DAY  . fenofibrate 160 MG tablet Take 1 tablet (160 mg total) by mouth daily.  . folic acid (FOLVITE) 1 MG tablet TAKE 2 TABLETS BY MOUTH EVERY DAY  . FREESTYLE LITE test strip USE TO TEST BLOOD SUGAR ONCE A DAY. DX CODE: E11.9  . furosemide (LASIX) 40 MG tablet TAKE 3 TABLETS BY MOUTH 2 (TWO) TIMES DAILY.  [name] glimepiride  (AMARYL) 1 MG tablet TAKE 1 TABLET (1 MG TOTAL) BY MOUTH DAILY WITH BREAKFAST.  . hydrocortisone cream 1 % Apply 1 application topically 2 (two) times daily.  . Lancets (FREESTYLE) lancets USE TWICE A DAY TO CHECK BLOOD SUGAR. DX E11.9  . metFORMIN (GLUCOPHAGE) 1000 MG tablet TAKE 1 & 1/2 TABLETS BY MOUTH EVERY MORNING AND 1 TABLET IN THE EVENING.  . metoprolol tartrate (LOPRESSOR) 25 MG tablet TAKE ONE-HALF TABLET BY MOUTH TWICE DAILY  . nitroGLYCERIN (NITROSTAT) 0.4 MG SL tablet PLACE 1 TABLET (0.4 MG TOTAL) UNDER THE TONGUE EVERY 5 (FIVE) MINUTES AS NEEDED FOR CHEST PAIN.  [name] polycarbophil (FIBERCON) 625 MG tablet Take 625 mg by mouth daily.  . potassium chloride SA (KLOR-CON M20) 20 MEQ tablet Take 2 tablets (40 mEq total) by mouth 2 (two) times daily.  . pregabalin (LYRICA) 150 MG capsule TAKE 1 CAPSULE BY MOUTH TWICE A DAY  . psyllium (METAMUCIL) 58.6 % powder Take 1 packet by mouth daily as needed (constipation).   . VASCEPA 1 g capsule TAKE 2 CAPSULES BY MOUTH TWICE A DAY   Facility-Administered Encounter Medications as of 05/10/2020  Medication  . sodium chloride flush (NS) 0.9 % injection 10 mL    Current Diagnosis: Patient Active Problem List   Diagnosis Date Noted  . Pain due to onychomycosis of toenails of both feet 12/14/2019  . Educated about COVID-19 virus infection 11/25/2019  . Uncontrolled type 2 diabetes mellitus with hypoglycemia without coma ([place])  08/10/2019  . Hyperlipidemia associated with type 2 diabetes mellitus ([place]) 08/10/2019  . S/P left TKA 06/29/2019  . Peripheral neuropathy 07/27/2018  . Uncontrolled type 2 diabetes mellitus with hyperglycemia ([place]) 05/13/2018  . Diabetic peripheral neuropathy associated with type 2 diabetes mellitus ([place]) 05/13/2018  . Pansinusitis 05/13/2018  . Severe aortic stenosis   . Complete heart block ([place])   . Paroxysmal atrial fibrillation (H[facility])   . Acute on chronic diastolic CHF (congestive heart failure), NYHA class 2 ([place])    . Left arm swelling   . Status post transcatheter aortic valve replacement (TAVR) using bioprosthesis 09/09/2017  . Demand ischemia of myocardium ([place])   . Typical atrial flutter ([place])   . Acute on chronic diastolic heart failure ([place])   . GIB (gastrointestinal bleeding) 08/16/2017  . Hyperlipidemia 08/05/2017  . NSTEMI (non-ST elevated myocardial infarction) ([place])   . Aortic stenosis, severe 07/25/2017  . Pancytopenia ([place]) 07/24/2017  . 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 ([place]) 02/27/2017  . Bradycardia 01/04/2017  . Coronary artery disease 01/04/2017  . Stenosis of carotid artery 01/04/2017  . Obesity 09/18/2016  . Wenckebach block   . DM (diabetes mellitus) type II uncontrolled, periph vascular disorder ([place]) 05/21/2013  . Spinal stenosis of lumbar region 04/08/2013  . Anemia, iron deficiency 11/29/2011  . B12 deficiency anemia 07/30/2011  . OSA (obstructive sleep apnea) 07/06/2010  . CAD, ARTERY BYPASS GRAFT 08/22/2009  . Essential hypertension 03/29/2009  . Bilateral lower extremity edema 02/21/2009  . Hyperlipidemia LDL goal <70 11/26/2008    Goals Addressed   None    Documented preliminary medication plan in preparation for patient's initial chronic care management visit.    Medication comparison prices listed on the onboarding form.   Follow-Up:  Medication Cost Review and Pharmacist Review   [place] Shannon, [facility] Pharmacist Assistant [phone]

## 2020-05-11 ENCOUNTER — Ambulatory Visit
Admission: RE | Admit: 2020-05-11 | Discharge: 2020-05-11 | Disposition: A | Discharge status: Home / Self Care | Payer: Medicare Other | Source: Ambulatory Visit | Attending: Nephrology | Admitting: Nephrology

## 2020-05-11 DIAGNOSIS — N1832 Chronic kidney disease, stage 3b: Secondary | ICD-10-CM

## 2020-05-11 DIAGNOSIS — R609 Edema, unspecified: Secondary | ICD-10-CM

## 2020-05-11 DIAGNOSIS — N281 Cyst of kidney, acquired: Secondary | ICD-10-CM | POA: Diagnosis not present

## 2020-05-11 DIAGNOSIS — N189 Chronic kidney disease, unspecified: Secondary | ICD-10-CM | POA: Diagnosis not present

## 2020-05-11 DIAGNOSIS — K7689 Other specified diseases of liver: Secondary | ICD-10-CM | POA: Diagnosis not present

## 2020-05-18 ENCOUNTER — Inpatient Hospital Stay: Payer: Medicare Other | Attending: Hematology & Oncology

## 2020-05-18 ENCOUNTER — Other Ambulatory Visit: Payer: Self-pay

## 2020-05-18 ENCOUNTER — Encounter: Payer: Self-pay | Admitting: Family

## 2020-05-18 ENCOUNTER — Inpatient Hospital Stay (HOSPITAL_BASED_OUTPATIENT_CLINIC_OR_DEPARTMENT_OTHER): Payer: Medicare Other | Admitting: Family

## 2020-05-18 ENCOUNTER — Inpatient Hospital Stay: Payer: Medicare Other

## 2020-05-18 ENCOUNTER — Telehealth: Payer: Self-pay

## 2020-05-18 VITALS — BP 123/62 | HR 81 | Temp 97.8°F | Resp 17 | Wt 256.5 lb

## 2020-05-18 DIAGNOSIS — D510 Vitamin B12 deficiency anemia due to intrinsic factor deficiency: Secondary | ICD-10-CM | POA: Diagnosis not present

## 2020-05-18 DIAGNOSIS — D500 Iron deficiency anemia secondary to blood loss (chronic): Secondary | ICD-10-CM

## 2020-05-18 DIAGNOSIS — D519 Vitamin B12 deficiency anemia, unspecified: Secondary | ICD-10-CM

## 2020-05-18 DIAGNOSIS — Z886 Allergy status to analgesic agent status: Secondary | ICD-10-CM | POA: Diagnosis not present

## 2020-05-18 DIAGNOSIS — C8338 Diffuse large B-cell lymphoma, lymph nodes of multiple sites: Secondary | ICD-10-CM | POA: Diagnosis not present

## 2020-05-18 DIAGNOSIS — G629 Polyneuropathy, unspecified: Secondary | ICD-10-CM | POA: Insufficient documentation

## 2020-05-18 DIAGNOSIS — D508 Other iron deficiency anemias: Secondary | ICD-10-CM

## 2020-05-18 DIAGNOSIS — Z95828 Presence of other vascular implants and grafts: Secondary | ICD-10-CM

## 2020-05-18 DIAGNOSIS — I6523 Occlusion and stenosis of bilateral carotid arteries: Secondary | ICD-10-CM | POA: Diagnosis not present

## 2020-05-18 DIAGNOSIS — C8332 Diffuse large B-cell lymphoma, intrathoracic lymph nodes: Secondary | ICD-10-CM

## 2020-05-18 LAB — CBC WITH DIFFERENTIAL (CANCER CENTER ONLY)
Abs Immature Granulocytes: 0.03 10*3/uL (ref 0.00–0.07)
Basophils Absolute: 0 10*3/uL (ref 0.0–0.1)
Basophils Relative: 1 %
Eosinophils Absolute: 0.3 10*3/uL (ref 0.0–0.5)
Eosinophils Relative: 4 %
HCT: 36.8 % — ABNORMAL LOW (ref 39.0–52.0)
Hemoglobin: 11.7 g/dL — ABNORMAL LOW (ref 13.0–17.0)
Immature Granulocytes: 1 %
Lymphocytes Relative: 20 %
Lymphs Abs: 1.2 10*3/uL (ref 0.7–4.0)
MCH: 28.7 pg (ref 26.0–34.0)
MCHC: 31.8 g/dL (ref 30.0–36.0)
MCV: 90.4 fL (ref 80.0–100.0)
Monocytes Absolute: 0.6 10*3/uL (ref 0.1–1.0)
Monocytes Relative: 9 %
Neutro Abs: 4.2 10*3/uL (ref 1.7–7.7)
Neutrophils Relative %: 65 %
Platelet Count: 195 10*3/uL (ref 150–400)
RBC: 4.07 MIL/uL — ABNORMAL LOW (ref 4.22–5.81)
RDW: 17 % — ABNORMAL HIGH (ref 11.5–15.5)
WBC Count: 6.3 10*3/uL (ref 4.0–10.5)
nRBC: 0 % (ref 0.0–0.2)

## 2020-05-18 LAB — RETICULOCYTES
Immature Retic Fract: 26 % — ABNORMAL HIGH (ref 2.3–15.9)
RBC.: 3.98 MIL/uL — ABNORMAL LOW (ref 4.22–5.81)
Retic Count, Absolute: 65.7 10*3/uL (ref 19.0–186.0)
Retic Ct Pct: 1.7 % (ref 0.4–3.1)

## 2020-05-18 LAB — CMP (CANCER CENTER ONLY)
ALT: 13 U/L (ref 0–44)
AST: 15 U/L (ref 15–41)
Albumin: 4.5 g/dL (ref 3.5–5.0)
Alkaline Phosphatase: 55 U/L (ref 38–126)
Anion gap: 8 (ref 5–15)
BUN: 30 mg/dL — ABNORMAL HIGH (ref 8–23)
CO2: 28 mmol/L (ref 22–32)
Calcium: 10.1 mg/dL (ref 8.9–10.3)
Chloride: 103 mmol/L (ref 98–111)
Creatinine: 1.59 mg/dL — ABNORMAL HIGH (ref 0.61–1.24)
GFR, Estimated: 43 mL/min — ABNORMAL LOW (ref >60)
Glucose, Bld: 180 mg/dL — ABNORMAL HIGH (ref 70–99)
Potassium: 4.2 mmol/L (ref 3.5–5.1)
Sodium: 139 mmol/L (ref 135–145)
Total Bilirubin: 0.6 mg/dL (ref 0.3–1.2)
Total Protein: 6.9 g/dL (ref 6.5–8.1)

## 2020-05-18 LAB — VITAMIN B12: Vitamin B-12: 187 pg/mL (ref 180–914)

## 2020-05-18 MED ORDER — ALTEPLASE 2 MG IJ SOLR
2.0000 mg | Freq: Once | INTRAMUSCULAR | Status: DC | PRN
  Filled 2020-05-18: qty 2

## 2020-05-18 MED ORDER — CYANOCOBALAMIN 1000 MCG/ML IJ SOLN
1000.0000 ug | Freq: Once | INTRAMUSCULAR | Status: AC
  Administered 2020-05-18: 1000 ug via INTRAMUSCULAR

## 2020-05-18 MED ORDER — SODIUM CHLORIDE 0.9% FLUSH
10.0000 mL | Freq: Once | INTRAVENOUS | Status: AC
  Administered 2020-05-18: 10 mL via INTRAVENOUS
  Filled 2020-05-18: qty 10

## 2020-05-18 MED ORDER — SODIUM CHLORIDE 0.9 % IV SOLN: 510.0000 mg | Freq: Once | INTRAVENOUS | Status: DC

## 2020-05-18 MED ORDER — SODIUM CHLORIDE 0.9% FLUSH
3.0000 mL | Freq: Once | INTRAVENOUS | Status: DC | PRN
  Filled 2020-05-18: qty 10

## 2020-05-18 MED ORDER — HEPARIN SOD (PORK) LOCK FLUSH 100 UNIT/ML IV SOLN
500.0000 [IU] | Freq: Once | INTRAVENOUS | Status: AC
  Administered 2020-05-18: 500 [IU] via INTRAVENOUS
  Filled 2020-05-18: qty 5

## 2020-05-18 NOTE — Progress Notes (Signed)
Hematology and Oncology Follow Up Visit  Richard [name] [id number] July 20, 1940 79 y.o. 05/18/2020   Principle Diagnosis:  Diffuse large cell non-Hodgkin's lymphoma (IPI = 3) - NOT "double hit" Pernicious anemia Iron deficiency secondary to bleeding  Past Therapy: R-CHOP - s/p cycle8 - completed 08/2017  Current Therapy: Vitamin B12 1 mg IM every month Xgeva 120 mg subcu q 3 months - next doseduein 05/2020 IV ironas indicated   Interim History:  Mr. Knick is here today for follow-up and B 12 injection. He is doing well and is feeling much better since seeing a nephrologist. He states that they took him off of Lasix and he is now on Torsemide. He notes that he feels great, swelling is down and he is now longer getting up multiple times a night to go to the bathroom.  No fever, chills, n/v, cough, rash, dizziness, SOB, chest pain, palpitations, abdominal pain or changes in bowel or bladder habits.  Minimal swelling in his lower extremities. Pedal pulses are 2+.  Neuropathy in his feet is stable.  No falls or syncope.  He is golfing several days a week.  He has maintained a good appetite and is staying well hydrated. His weight is stable.   ECOG Performance Status: 1 - Symptomatic but completely ambulatory  Medications:  Allergies as of 05/18/2020      Reactions   Benazepril Swelling   angioedema; he is not a candidate for any angiotensin receptor blockers because of this significant allergic reaction. Because of a history of documented adverse serious drug reaction;Medi Alert bracelet  is recommended   Hctz [hydrochlorothiazide] Anaphylaxis, Swelling   Tongue and lip swelling   Aspirin Other (See Comments)   Gastritis, cant take 325 Mg aspirin    Lactose Intolerance (gi) Nausea And Vomiting      Medication List       Accurate as of May 18, 2020 10:37 AM. If you have any questions, ask your nurse or doctor.        STOP taking these medications     furosemide 40 MG tablet Commonly known as: LASIX Stopped by: Laverna Peace, NP     TAKE these medications   allopurinol 300 MG tablet Commonly known as: ZYLOPRIM Take 450 mg by mouth daily.   aluminum hydroxide-magnesium carbonate 95-358 MG/15ML Susp Commonly known as: GAVISCON Take 15 mLs by mouth as needed for indigestion or heartburn.   amLODipine 5 MG tablet Commonly known as: NORVASC TAKE 1 TABLET BY MOUTH TWICE A DAY   atorvastatin 80 MG tablet Commonly known as: LIPITOR TAKE ONE TABLET BY MOUTH AT BEDTIME   azelastine 0.1 % nasal spray Commonly known as: ASTELIN Place 2 sprays into both nostrils at bedtime as needed for rhinitis or allergies.   Eliquis 2.5 MG Tabs tablet Generic drug: apixaban TAKE 1 TABLET BY MOUTH TWICE A DAY   esomeprazole 40 MG capsule Commonly known as: NexIUM Take 1 capsule (40 mg total) by mouth daily.   ezetimibe 10 MG tablet Commonly known as: ZETIA TAKE 1 TABLET BY MOUTH EVERY DAY   fenofibrate 160 MG tablet Take 1 tablet (160 mg total) by mouth daily.   folic acid 1 MG tablet Commonly known as: FOLVITE TAKE 2 TABLETS BY MOUTH EVERY DAY   freestyle lancets USE TWICE A DAY TO CHECK BLOOD SUGAR. DX E11.9   FREESTYLE LITE test strip Generic drug: glucose blood USE TO TEST BLOOD SUGAR ONCE A DAY. DX CODE: E11.9   glimepiride 1 MG tablet  Commonly known as: AMARYL TAKE 1 TABLET (1 MG TOTAL) BY MOUTH DAILY WITH BREAKFAST.   hydrocortisone cream 1 % Apply 1 application topically 2 (two) times daily.   metFORMIN 1000 MG tablet Commonly known as: GLUCOPHAGE TAKE 1 & 1/2 TABLETS BY MOUTH EVERY MORNING AND 1 TABLET IN THE EVENING.   metoprolol tartrate 25 MG tablet Commonly known as: LOPRESSOR TAKE ONE-HALF TABLET BY MOUTH TWICE DAILY   nitroGLYCERIN 0.4 MG SL tablet Commonly known as: NITROSTAT PLACE 1 TABLET (0.4 MG TOTAL) UNDER THE TONGUE EVERY 5 (FIVE) MINUTES AS NEEDED FOR CHEST PAIN.   polycarbophil 625 MG  tablet Commonly known as: FIBERCON Take 625 mg by mouth daily.   potassium chloride SA 20 MEQ tablet Commonly known as: K[place]-Con M20 Take 2 tablets (40 mEq total) by mouth 2 (two) times daily.   pregabalin 150 MG capsule Commonly known as: LYRICA TAKE 1 CAPSULE BY MOUTH TWICE A DAY   psyllium 58.6 % powder Commonly known as: METAMUCIL Take 1 packet by mouth daily as needed (constipation).   torsemide 20 MG tablet Commonly known as: DEMADEX Take 20 mg by mouth 2 (two) times daily.   Vascepa 1 g capsule Generic drug: icosapent Ethyl TAKE 2 CAPSULES BY MOUTH TWICE A DAY       Allergies:  Allergies  Allergen Reactions   Benazepril Swelling    angioedema; he is not a candidate for any angiotensin receptor blockers because of this significant allergic reaction. Because of a history of documented adverse serious drug reaction;Medi Alert bracelet  is recommended   Hctz [Hydrochlorothiazide] Anaphylaxis and Swelling    Tongue and lip swelling    Aspirin Other (See Comments)    Gastritis, cant take 325 Mg aspirin    Lactose Intolerance (Gi) Nausea And Vomiting    Past Medical History, Surgical history, Social history, and Family History were reviewed and updated.  Review of Systems: All other 10 point review of systems is negative.   Physical Exam:  vitals were not taken for this visit.   Wt Readings from Last 3 Encounters:  04/18/20 248 lb (112.5 kg)  04/13/20 249 lb 6.4 oz (113.1 kg)  04/12/20 250 lb (113.4 kg)    Ocular: Sclerae unicteric, pupils equal, round and reactive to light Ear-nose-throat: Oropharynx clear, dentition fair Lymphatic: No cervical or supraclavicular adenopathy Lungs no rales or rhonchi, good excursion bilaterally Heart regular rate and rhythm, no murmur appreciated Abd soft, nontender, positive bowel sounds MSK no focal spinal tenderness, no joint edema Neuro: non-focal, well-oriented, appropriate affect Breasts: Deferred   Lab  Results  Component Value Date   WBC 6.3 05/18/2020   HGB 11.7 (L) 05/18/2020   HCT 36.8 (L) 05/18/2020   MCV 90.4 05/18/2020   PLT 195 05/18/2020   Lab Results  Component Value Date   FERRITIN 776 (H) 04/12/2020   IRON 70 04/12/2020   TIBC 415 04/12/2020   UIBC 345 04/12/2020   IRONPCTSAT 17 (L) 04/12/2020   Lab Results  Component Value Date   RETICCTPCT 1.7 05/18/2020   RBC 3.98 (L) 05/18/2020   RBC 4.07 (L) 05/18/2020   RETICCTABS 52.1 11/22/2011   No results found for: Nils Pyle [facility] Lab Results  Component Value Date   IGA 159 03/09/2012   Lab Results  Component Value Date   ALBUMINELP 4.2 07/27/2018   MSPIKE Not Observed 07/27/2018     Chemistry      Component Value Date/Time   NA 138 04/18/2020 0956   NA  136 03/16/2019 0938   NA 144 06/27/2017 0857   K 4.3 04/18/2020 0956   K 3.9 06/27/2017 0857   CL 98 04/18/2020 0956   CL 103 06/27/2017 0857   CO2 27 04/18/2020 0956   CO2 26 06/27/2017 0857   BUN 25 04/18/2020 0956   BUN 34 (H) 03/16/2019 0938   BUN 12 06/27/2017 0857   CREATININE 1.65 (H) 04/18/2020 0956      Component Value Date/Time   CALCIUM 9.7 04/18/2020 0956   CALCIUM 9.2 06/27/2017 0857   ALKPHOS 53 04/12/2020 1321   ALKPHOS 128 (H) 06/27/2017 0857   AST 21 04/18/2020 0956   AST 22 04/12/2020 1321   ALT 23 04/18/2020 0956   ALT 24 04/12/2020 1321   ALT 24 06/27/2017 0857   BILITOT 0.6 04/18/2020 0956   BILITOT 0.5 04/12/2020 1321       Impression and Plan: Mr. Osuna is a very pleasant 79yo caucasian gentleman withdiffuse large B-cell lymphoma (not"double hit"lymphoma). He completed treatment in February 2019.  He received his B 12 today as planned.  Iron studies ae pending. We will replace if needed.  Follow-up in 1 month.  He was encouraged to contact our office with any questions or concerns.   Laverna Peace, NP 11/18/202110:37 AM

## 2020-05-18 NOTE — Patient Instructions (Signed)

## 2020-05-18 NOTE — Patient Instructions (Signed)
Tunneled Central Venous Catheter Flushing Guide  It is important to flush your tunneled central venous catheter each time you use it, both before and after you use it. Flushing your catheter will help prevent it from clogging. What are the risks? Risks may include:  Infection.  Air getting into the catheter and bloodstream. Supplies needed:  A clean pair of gloves.  A disinfecting wipe. Use an alcohol wipe, chlorhexidine wipe, or iodine wipe as told by your health care provider.  A 10 mL syringe that has been prefilled with saline solution.  An empty 10 mL syringe, if a substance called heparin was injected into your catheter. How to flush your catheter When you flush your catheter, make sure you follow any specific instructions from your health care provider or the manufacturer. These are general guidelines. Flushing your catheter before use If there is heparin in your catheter: 1. Wash your hands with soap and water. 2. Put on gloves. 3. Scrub the injection cap for a minimum of 15 seconds with a disinfecting wipe. 4. Unclamp the catheter. 5. Attach the empty syringe to the injection cap. 6. Pull the syringe plunger back and withdraw 10 mL of blood. 7. Place the syringe into an appropriate waste container. 8. Scrub the injection cap for 15 seconds with a disinfecting wipe. 9. Attach the prefilled syringe to the injection cap. 10. Flush the catheter by pushing the plunger forward until all the liquid from the syringe is in the catheter. 11. Remove the syringe from the injection cap. 12. Clamp the catheter. If there is no heparin in your catheter: 1. Wash your hands with soap and water. 2. Put on gloves. 3. Scrub the injection cap for 15 seconds with a disinfecting wipe. 4. Unclamp the catheter. 5. Attach the prefilled syringe to the injection cap. 6. Flush the catheter by pushing the plunger forward until 5 mL of the liquid from the syringe is in the catheter. 7. Pull back on  the syringe until you see blood in the catheter. 8. If you have been asked to collect any blood, follow your health care provider's instructions. Otherwise, flush the catheter with the rest of the solution from the syringe. 9. Remove the syringe from the injection cap. 10. Clamp the catheter.  Flushing your catheter after use 1. Wash your hands with soap and water. 2. Put on gloves. 3. Scrub the injection cap for 15 seconds with a disinfecting wipe. 4. Unclamp the catheter. 5. Attach the prefilled syringe to the injection cap. 6. Flush the catheter by pushing the plunger forward until all of the liquid from the syringe is in the catheter. 7. Remove the syringe from the injection cap. 8. Clamp the catheter. Problems and solutions  If blood cannot be completely cleared from the injection cap, you may need to have the injection cap replaced.  If the catheter is difficult to flush, use the pulsing method. The pulsing method involves pushing only a few milliliters of solution into the catheter at a time and pausing between pushes.  If you do not see blood in the catheter when you pull back on the syringe, change your body position, such as by raising your arms above your head. Take a deep breath and cough. Then, pull back on the syringe. If you still do not see blood, flush the catheter with a small amount of solution. Then, change positions again and take a breath or cough. Pull back on the syringe again. If you still do not see   blood, finish flushing the catheter and contact your health care provider. Do not use your catheter until your health care provider says it is okay. General tips  Have someone help you flush your catheter, if possible.  Do not force fluid through your catheter.  Do not use a syringe that is larger or smaller than 10 mL. Using a smaller syringe can make the catheter burst.  Do not use your catheter without flushing it first if it has heparin in it. Contact a health  care provider if:  You cannot see any blood in the catheter when you flush it before using it.  Your catheter is difficult to flush. Get help right away if:  You cannot flush the catheter.  The catheter leaks when you flush it or when there is fluid in it.  There are cracks or breaks in the catheter. Summary  It is important to flush your tunneled central venous catheter each time you use it, both before and after you use it.  Scrub the injection cap for 15 seconds with a disinfecting wipe before and after you flush it.  When you flush your catheter, make sure you follow any specific instructions from your health care provider or the manufacturer.  Get help right away if you cannot flush the catheter. This information is not intended to replace advice given to you by your health care provider. Make sure you discuss any questions you have with your health care provider. Document Revised: 03/12/2019 Document Reviewed: 09/02/2018 Elsevier Patient Education  2020 [employer]  

## 2020-05-18 NOTE — Telephone Encounter (Signed)
S/w pt per 05/18/20 los and he is aware of his f/u appts 12.2021... AOM

## 2020-05-19 LAB — IRON AND TIBC
Iron: 78 ug/dL (ref 42–163)
Saturation Ratios: 23 % (ref 20–55)
TIBC: 339 ug/dL (ref 202–409)
UIBC: 261 ug/dL (ref 117–376)

## 2020-05-19 LAB — FERRITIN: Ferritin: 948 ng/mL — ABNORMAL HIGH (ref 24–336)

## 2020-05-22 DIAGNOSIS — M150 Primary generalized (osteo)arthritis: Secondary | ICD-10-CM | POA: Diagnosis not present

## 2020-05-22 DIAGNOSIS — G5793 Unspecified mononeuropathy of bilateral lower limbs: Secondary | ICD-10-CM | POA: Diagnosis not present

## 2020-05-22 DIAGNOSIS — M1A09X0 Idiopathic chronic gout, multiple sites, without tophus (tophi): Secondary | ICD-10-CM | POA: Diagnosis not present

## 2020-05-22 DIAGNOSIS — E669 Obesity, unspecified: Secondary | ICD-10-CM | POA: Diagnosis not present

## 2020-05-22 DIAGNOSIS — Z6837 Body mass index (BMI) 37.0-37.9, adult: Secondary | ICD-10-CM | POA: Diagnosis not present

## 2020-05-24 ENCOUNTER — Other Ambulatory Visit: Payer: Self-pay | Admitting: Family Medicine

## 2020-05-24 DIAGNOSIS — M5459 Other low back pain: Secondary | ICD-10-CM | POA: Diagnosis not present

## 2020-06-12 ENCOUNTER — Other Ambulatory Visit: Payer: Self-pay | Admitting: Family Medicine

## 2020-06-13 ENCOUNTER — Ambulatory Visit (INDEPENDENT_AMBULATORY_CARE_PROVIDER_SITE_OTHER): Payer: Medicare Other | Admitting: Dermatology

## 2020-06-13 ENCOUNTER — Other Ambulatory Visit: Payer: Self-pay

## 2020-06-13 DIAGNOSIS — L570 Actinic keratosis: Secondary | ICD-10-CM

## 2020-06-13 DIAGNOSIS — L578 Other skin changes due to chronic exposure to nonionizing radiation: Secondary | ICD-10-CM | POA: Diagnosis not present

## 2020-06-13 DIAGNOSIS — I6523 Occlusion and stenosis of bilateral carotid arteries: Secondary | ICD-10-CM

## 2020-06-13 NOTE — Progress Notes (Signed)
Follow-Up Visit   Subjective  Richard [name] is a 79 y.o. male who presents for the following: Actinic Keratosis (Of the face, arms, and hands - check for any new or persistent skin lesions).  40 spots frozen last visit.  The following portions of the chart were reviewed this encounter and updated as appropriate:      Review of Systems: No other skin or systemic complaints.  Objective  Well appearing patient in no apparent distress; mood and affect are within normal limits.  A focused examination was performed including the face, scalp, arms, and hands. Relevant physical exam findings are noted in the Assessment and Plan.  Objective  L neck below jaw x 1, L ear helix x 1, L temple x 2, L zygoma x 2, nasal dorsum x 1, R nasal tip x 1, R cheek x 5, R post auricular x 1, R arm x 3, R hand x 1, L arm x 8, L hand x 5 (31): Pink keratotic macules/papules  Assessment & Plan  AK (actinic keratosis) (31) L neck below jaw x 1, L ear helix x 1, L temple x 2, L zygoma x 2, nasal dorsum x 1, R nasal tip x 1, R cheek x 5, R post auricular x 1, R arm x 3, R hand x 1, L arm x 8, L hand x 5  Hypertrophic -  Recheck at follow up: L neck below jaw, R upper forearm   Destruction of lesion - L neck below jaw x 1, L ear helix x 1, L temple x 2, L zygoma x 2, nasal dorsum x 1, R nasal tip x 1, R cheek x 5, R post auricular x 1, R arm x 3, R hand x 1, L arm x 8, L hand x 5  Destruction method: cryotherapy   Informed consent: discussed and consent obtained   Lesion destroyed using liquid nitrogen: Yes   Region frozen until ice ball extended beyond lesion: Yes   Outcome: patient tolerated procedure well with no complications   Post-procedure details: wound care instructions given    Actinic Damage - Severe, chronic, secondary to cumulative UV radiation exposure over time - diffuse scaly erythematous macules and papules with underlying dyspigmentation face, arms - Discussed Prescription "Field  Treatment" for Severe, Chronic Confluent Actinic Changes with Pre-Cancerous Actinic Keratoses Field treatment involves treatment of an entire area of skin that has confluent Actinic Changes (Sun/ Ultraviolet light damage) and PreCancerous Actinic Keratoses by method of PhotoDynamic Therapy (PDT) and/or prescription Topical Chemotherapy agents such as 5-fluorouracil, 5-fluorouracil/calcipotriene, and/or imiquimod.  The purpose is to decrease the number of clinically evident and subclinical PreCancerous lesions to prevent progression to development of skin cancer by chemically destroying early precancer changes that may or may not be visible.  It has been shown to reduce the risk of developing skin cancer in the treated area. As a result of treatment, redness, scaling, crusting, and open sores may occur during treatment course. One or more than one of these methods may be used and may have to be used several times to control, suppress and eliminate the PreCancerous changes. Discussed treatment course, expected reaction, and possible side effects. - Recommend daily broad spectrum sunscreen SPF 30+ to sun-exposed areas, reapply every 2 hours as needed.  - Call for new or changing lesions. - Will schedule photodynamic therapy to the face and arms  Return in about 1 month (around 07/14/2020) for nurse visit - PDT of the face, then two  weeks later do the arms; 3 month follow up with Dr. Nicole Kindred .   [name], CMA, am acting as scribe for Brendolyn Patty, MD .  Documentation: I have reviewed the above documentation for accuracy and completeness, and I agree with the above.  Brendolyn Patty MD

## 2020-06-14 ENCOUNTER — Telehealth: Payer: Medicare Other

## 2020-06-14 ENCOUNTER — Ambulatory Visit: Payer: Self-pay | Admitting: Pharmacist

## 2020-06-14 ENCOUNTER — Ambulatory Visit: Payer: Medicare Other | Admitting: Pharmacist

## 2020-06-14 DIAGNOSIS — I10 Essential (primary) hypertension: Secondary | ICD-10-CM

## 2020-06-14 DIAGNOSIS — E1165 Type 2 diabetes mellitus with hyperglycemia: Secondary | ICD-10-CM

## 2020-06-14 DIAGNOSIS — E781 Pure hyperglyceridemia: Secondary | ICD-10-CM

## 2020-06-14 NOTE — Chronic Care Management (AMB) (Signed)
  Chronic Care Management   Outreach Note  06/14/2020 Name: Richard [name] MRN: [id number] DOB: [birth year]  Referred by: [name], DO Reason for referral : No chief complaint on file.   Third unsuccessful telephone outreach was attempted today. The patient was referred to the pharmacist for assistance with care management and care coordination.   Follow Up Plan:  -Attempt follow up next month  Melvenia Beam Anberlyn Feimster, PharmD, BCACP Clinical Pharmacist [facility] [phone]

## 2020-06-14 NOTE — Chronic Care Management (AMB) (Deleted)
Chronic Care Management Pharmacy  Name: Richard [name]  MRN: [id number] DOB: [birth year]  Chief Complaint/ HPI  Richard [name],  79 y.o. , male presents for their Follow-Up CCM visit with the clinical pharmacist In office.  PCP : [name], DO  Their chronic conditions include: Hypertension, Hyperlipidemia/CAD, Diabetes, AFib, Heart Failure, Neuropathy, Gout, GERD, Allergic Rhinitis  Office Visits: 04/18/20: Visit w/ Dr. Etter Sjogren - Referral to lipid clinic, but appears pt has not scheduled appt. Referal to nephro.Marland Kitchenappointment made?  Consult Visit: 06/13/20: Derm visit w/ Dr. Nicole Kindred - Inflamed seborrheic keratosis: Cryotherapy   05/18/20: Heme/Onc visit w/ Laverna Peace, NP - B12 given. RTC 1 month  Medications: Outpatient Encounter Medications as of 06/14/2020  Medication Sig  . allopurinol (ZYLOPRIM) 300 MG tablet Take 450 mg by mouth daily.   [name] aluminum hydroxide-magnesium carbonate (GAVISCON) 95-358 MG/15ML SUSP Take 15 mLs by mouth as needed for indigestion or heartburn.  [name] amLODipine (NORVASC) 5 MG tablet TAKE 1 TABLET BY MOUTH TWICE A Haidynn Almendarez  . atorvastatin (LIPITOR) 80 MG tablet TAKE ONE TABLET BY MOUTH AT BEDTIME  . azelastine (ASTELIN) 0.1 % nasal spray Place 2 sprays into both nostrils at bedtime as needed for rhinitis or allergies.  [name] ELIQUIS 2.5 MG TABS tablet TAKE 1 TABLET BY MOUTH TWICE A Windle Huebert  . esomeprazole (NEXIUM) 40 MG capsule Take 1 capsule (40 mg total) by mouth daily.  [name] ezetimibe (ZETIA) 10 MG tablet TAKE 1 TABLET BY MOUTH EVERY Alysabeth Scalia  . fenofibrate 160 MG tablet Take 1 tablet (160 mg total) by mouth daily.  . folic acid (FOLVITE) 1 MG tablet TAKE 2 TABLETS BY MOUTH EVERY Terel Bann  . FREESTYLE LITE test strip USE TO TEST BLOOD SUGAR ONCE A Kahley Leib. DX CODE: E11.9  . glimepiride (AMARYL) 1 MG tablet TAKE 1 TABLET (1 MG TOTAL) BY MOUTH DAILY WITH BREAKFAST.  . hydrocortisone cream 1 % Apply 1 application topically 2 (two) times daily.  . Lancets (FREESTYLE)  lancets USE TWICE A Mariana Goytia TO CHECK BLOOD SUGAR. DX E11.9  . metFORMIN (GLUCOPHAGE) 1000 MG tablet TAKE 1 & 1/2 TABLETS BY MOUTH EVERY MORNING AND 1 TABLET IN THE EVENING.  . metoprolol tartrate (LOPRESSOR) 25 MG tablet TAKE ONE-HALF TABLET BY MOUTH TWICE DAILY  . nitroGLYCERIN (NITROSTAT) 0.4 MG SL tablet PLACE 1 TABLET (0.4 MG TOTAL) UNDER THE TONGUE EVERY 5 (FIVE) MINUTES AS NEEDED FOR CHEST PAIN.  [name] polycarbophil (FIBERCON) 625 MG tablet Take 625 mg by mouth daily.  . potassium chloride SA (KLOR-CON M20) 20 MEQ tablet Take 2 tablets (40 mEq total) by mouth 2 (two) times daily.  . pregabalin (LYRICA) 150 MG capsule TAKE 1 CAPSULE BY MOUTH TWICE A Dejanae Helser  . psyllium (METAMUCIL) 58.6 % powder Take 1 packet by mouth daily as needed (constipation).   . torsemide (DEMADEX) 20 MG tablet Take 20 mg by mouth 2 (two) times daily.  [name] VASCEPA 1 g capsule TAKE 2 CAPSULES BY MOUTH TWICE A Priyansh Pry   Facility-Administered Encounter Medications as of 06/14/2020  Medication  . 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  Retired at 79 yrs old. 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 Divorced Has a life alert Daughter died  2 years ago.  Son lives in [place]. Has 3 grandaughters.  Has a sister that lives in [place].  Lives alone.   Hypertension   BP goal is:  <130/80  Office blood pressures are  BP Readings from Last 3 Encounters:  05/18/20 123/62  04/20/20 (!) 143/70  04/18/20 110/60   Patient checks BP at home 1-2x per week Patient home BP readings are ranging: Unable to assess (states he has not had any BP >140 systolic or >75 diastolic)  Patient has failed these meds in the past: Benazepril, hctz (angioedema) Patient is currently controlled on the following  medications:  . Amlodipine 5mg daily  . Metoprolol Tartrate 25mg #0.5 tabs twice daily   Diet Cutting sweets, only eating fried foods 1-2x/week, rarely eats sausage (~4x/year) States he is "hooked on cheese" B - cereal (cheerios) w/ 2% milk L - roasted turkey meat w/ cheese, dukes mayo, lettuce, and whole wheat bread D - Olympics - pork chop with salad Snacks - PB before bedtime, eats jalapeno potatoe chips occasionally  Drinks - Diet drinks (Dr. Pepper and Sprite); drinks about 4 per ; "a lot of water" (5-6 bottles per )  Exercise Plays golf 3x/week Dose 60 leg raises and leg bends  We discussed diet and exercise extensively and BP goal   Update 06/14/20  Plan -Continue current medications   Hyperlipidemia/CAD   LDL goal < 70  Lipid Panel     Component Value Date/Time   CHOL 177 04/18/2020 0956   CHOL 159 01/25/2020 0819   TRIG 745 (H) 04/18/2020 0956   HDL 30 (L) 04/18/2020 0956   HDL 31 (L) 01/25/2020 0819   LDLCALC  04/18/2020 0956     Comment:     . LDL cholesterol not calculated. Triglyceride levels greater than 400 mg/dL invalidate calculated LDL results. . Reference range: <100 . Desirable range <100 mg/dL for primary prevention;   <70 mg/dL for patients with CHD or diabetic patients  with > or = 2 CHD risk factors. . LDL-C is now calculated using the Martin-Hopkins  calculation, which is a validated novel method providing  better accuracy than the Friedewald equation in the  estimation of LDL-C.  Martin SS et al. JAMA. 2013;310(19): 2061-2068  (http://education.[facility].com/faq/FAQ164)    LDLDIRECT 48.0 11/22/2019 1037    Hepatic Function Latest Ref Rng & Units 05/18/2020 04/18/2020 04/12/2020  Total Protein 6.5 - 8.1 g/dL 6.9 6.9 6.7  Albumin 3.5 - 5.0 g/dL 4.5 - 4.5  AST 15 - 41 U/L 15 21 22  ALT 0 - 44 U/L 13 23 24  Alk Phosphatase 38 - 126 U/L 55 - 53  Total Bilirubin 0.3 - 1.2 mg/dL 0.6 0.6 0.5  Bilirubin, Direct 0.0 - 0.3  mg/dL - - -     The ASCVD Risk score (Goff DC Jr., et al., 2013) failed to calculate for the following reasons:   The 2013 ASCVD risk score is only valid for ages 40 to 79   The patient has a prior MI or stroke diagnosis   Patient has failed these meds in past: None noted  Patient is currently borderline controlled, except for TRIG on the following medications:  . Atorvastatin 80mg daily . Ezetimibe 10mg daily . Fenofibrate 160mg daily . Vascepa 1g #2 twice daily  . Nitroglycerin 0.4mg as needed  Patient unsure if Vascepa is working for him. Discussed risks associated with elevated TRIG including pancreatitis  We discussed:  diet and exercise extensively   Update 06/14/20 Lipid   clinic??  Plan -Continue current medications  Diabetes   A1c goal <7%  Recent Relevant Labs: Lab Results  Component Value Date/Time   HGBA1C 7.0 (H) 04/18/2020 09:56 AM   HGBA1C 7.0 (H) 11/22/2019 10:37 AM   GFR 37.32 (L) 11/22/2019 10:37 AM   GFR 41.39 (L) 08/10/2019 01:29 PM   MICROALBUR <0.2 04/18/2020 09:56 AM   MICROALBUR 0.7 08/10/2019 01:29 PM    Last diabetic Eye exam:  Lab Results  Component Value Date/Time   HMDIABEYEEXA No Retinopathy 05/21/2018 12:00 AM    Last diabetic Foot exam: No results found for: HMDIABFOOTEX   Checking BG: Daily  Recent FBG Readings: FBG this AM 180, 140 yesterday  Patient has failed these meds in past: None noted  Patient is currently controlled on the following medications: . Glimepiride 77m daily  . Metformin 10043m#1.5 tabs AM, and #1 tab PM  We discussed: A1c goal   Update 06/14/20 A1c stable  Plan -Continue current medications   Heart Failure    Followed by Cardio (Hochrein)  Type: Diastolic  Last ejection fraction: 11/05/2018 50-55%  Patient has failed these meds in past: None noted  Patient is currently controlled on the following medications:  Torsemide 2071mwice daily (changed from furosemide)  Potassium 38m30m2 twice  daily  We discussed the dosing of his furosemide and how this is helpful for edema, but could also affect his kidneys. Discussed risk/benefit of furosemide use at high dosages Thoroughly explained the meaning of Creatinine and GFR. Pt was appreciative of explanation.  He is scheduled to see nephrologist in 2 weeks.   Update 06/14/20 Nephrology switched from furosemide to torsemide?  Plan -Assess furosemide dosing with nephrology and cardiology   Medication Management   Pt [facility] medications Uses pill box? Yes (Has an AM and PM pill box that he feels every Monday) Pt endorses 100% compliance  Routine: Wake, check sugar, take meds, eat cereal  Goes in donut hole in May.    Meds to D/C from list Doxycycline 100mg12mrocortisone 1% cream Fibercon 625mg 64mamucil  We discussed: Discussed benefits of medication synchronization, packaging and delivery as well as enhanced pharmacist oversight with [facility]. Patient really appreciates his pharmacist yet also sees the benefit of having is medication packaged and delivered to him. He will consider pending pricing.   Plan  Continue current medication management strategy   Follow up:  2 month phone visit  KaneshDe BlanchmD, BCACP Clinical Pharmacist [phone]

## 2020-06-14 NOTE — Chronic Care Management (AMB) (Signed)
Chronic Care Management Pharmacy  Name: Richard [name]  MRN: [id number] DOB: 1940-11-01  Chief Complaint/ HPI  Richard [name],  80 y.o. , male presents for their Follow-Up CCM visit with the clinical pharmacist In office.  PCP : [name], DO  Their chronic conditions include: Hypertension, Hyperlipidemia/CAD, Diabetes, AFib, Heart Failure, Neuropathy, Gout, GERD, Allergic Rhinitis  Office Visits: 04/18/20: Visit w/ Dr. Etter Sjogren - Referral to lipid clinic, but appears pt has not scheduled appt. Referal to nephro.Marland Kitchenappointment made?  Consult Visit: 06/13/20: Derm visit w/ Dr. Nicole Kindred - Inflamed seborrheic keratosis: Cryotherapy   05/18/20: Heme/Onc visit w/ Laverna Peace, NP - B12 given. RTC 1 month  Medications: Outpatient Encounter Medications as of 06/14/2020  Medication Sig  . allopurinol (ZYLOPRIM) 300 MG tablet Take 450 mg by mouth daily.   [name] aluminum hydroxide-magnesium carbonate (GAVISCON) 95-358 MG/15ML SUSP Take 15 mLs by mouth as needed for indigestion or heartburn.  [name] amLODipine (NORVASC) 5 MG tablet TAKE 1 TABLET BY MOUTH TWICE A Emery Dupuy  . atorvastatin (LIPITOR) 80 MG tablet TAKE ONE TABLET BY MOUTH AT BEDTIME  . azelastine (ASTELIN) 0.1 % nasal spray Place 2 sprays into both nostrils at bedtime as needed for rhinitis or allergies.  [name] ELIQUIS 2.5 MG TABS tablet TAKE 1 TABLET BY MOUTH TWICE A Knut Rondinelli  . esomeprazole (NEXIUM) 40 MG capsule Take 1 capsule (40 mg total) by mouth daily.  [name] ezetimibe (ZETIA) 10 MG tablet TAKE 1 TABLET BY MOUTH EVERY Elanie Hammitt  . fenofibrate 160 MG tablet Take 1 tablet (160 mg total) by mouth daily.  . folic acid (FOLVITE) 1 MG tablet TAKE 2 TABLETS BY MOUTH EVERY Cobi Aldape  . FREESTYLE LITE test strip USE TO TEST BLOOD SUGAR ONCE A Brita Jurgensen. DX CODE: E11.9  . glimepiride (AMARYL) 1 MG tablet TAKE 1 TABLET (1 MG TOTAL) BY MOUTH DAILY WITH BREAKFAST.  . hydrocortisone cream 1 % Apply 1 application topically 2 (two) times daily.  . Lancets (FREESTYLE)  lancets USE TWICE A Sigrid Schwebach TO CHECK BLOOD SUGAR. DX E11.9  . metFORMIN (GLUCOPHAGE) 1000 MG tablet TAKE 1 & 1/2 TABLETS BY MOUTH EVERY MORNING AND 1 TABLET IN THE EVENING.  . metoprolol tartrate (LOPRESSOR) 25 MG tablet TAKE ONE-HALF TABLET BY MOUTH TWICE DAILY  . nitroGLYCERIN (NITROSTAT) 0.4 MG SL tablet PLACE 1 TABLET (0.4 MG TOTAL) UNDER THE TONGUE EVERY 5 (FIVE) MINUTES AS NEEDED FOR CHEST PAIN.  [name] polycarbophil (FIBERCON) 625 MG tablet Take 625 mg by mouth daily.  . potassium chloride SA (KLOR-CON M20) 20 MEQ tablet Take 2 tablets (40 mEq total) by mouth 2 (two) times daily.  . pregabalin (LYRICA) 150 MG capsule TAKE 1 CAPSULE BY MOUTH TWICE A Thierry Dobosz  . psyllium (METAMUCIL) 58.6 % powder Take 1 packet by mouth daily as needed (constipation).   . torsemide (DEMADEX) 20 MG tablet Take 40 mg by mouth daily.  [name] VASCEPA 1 g capsule TAKE 2 CAPSULES BY MOUTH TWICE A Nicolle Heward   Facility-Administered Encounter Medications as of 06/14/2020  Medication  . sodium chloride flush (NS) 0.9 % injection 10 mL   SDOH Screenings   Alcohol Screen: Not on file  Depression (PHQ2-9): Low Risk   . PHQ-2 Score: 0  Financial Resource Strain: Low Risk   . Difficulty of Paying Living Expenses: Not hard at all  Food Insecurity: No Food Insecurity  . Worried About Charity fundraiser in the Last Year: Never true  . Ran Out of Food in the Last  Year: Never true  Housing: Low Risk   . Last Housing Risk Score: 0  Physical Activity: Not on file  Social Connections: Not on file  Stress: Not on file  Tobacco Use: Low Risk   . Smoking Tobacco Use: Never Smoker  . Smokeless Tobacco Use: Never Used  Transportation Needs: No Transportation Needs  . Lack of Transportation (Medical): No  . Lack of Transportation (Non-Medical): No   Current Diagnosis/Assessment:  Goals Addressed            This Visit's Progress   . Chronic Care Management Pharmacy Care Plan       CARE PLAN ENTRY (see longitudinal plan of care for  additional care plan information)  Current Barriers:  . Chronic Disease Management support, education, and care coordination needs related to Hypertension, Hyperlipidemia/CAD, Diabetes, AFib, Heart Failure, Neuropathy, Gout, GERD, Allergic Rhinitis   Hypertension BP Readings from Last 3 Encounters:  05/18/20 123/62  04/20/20 (!) 143/70  04/18/20 110/60   . Pharmacist Clinical Goal(s): o Over the next 90 days, patient will work with PharmD and providers to maintain BP goal <130/80 . Current regimen:  . Amlodipine 45m daily  . Metoprolol Tartrate 298m#0.5 tabs twice daily  . Interventions: o Discussed BP goal, diet, and exercise . Patient self care activities - Over the next 90 days, patient will: o Check BP 1-2 times per week, document, and provide at future appointments o Ensure daily salt intake < 2300 mg/Jearld Hemp  Hyperlipidemia/CAD Lab Results  Component Value Date/Time   [facility]     Comment:     . LDL cholesterol not calculated. Triglyceride levels greater than 400 mg/dL invalidate calculated LDL results. . Reference range: <100 . Desirable range <100 mg/dL for primary prevention;   <70 mg/dL for patients with CHD or diabetic patients  with > or = 2 CHD risk factors. . Marland KitchenDL-C is now calculated using the Martin-Hopkins  calculation, which is a validated novel method providing  better accuracy than the Friedewald equation in the  estimation of LDL-C.  MaCresenciano Genret al. JAAnnamaria [id number];831(51 [id number]  (http://education.[facility].com/faq/FAQ164)    LDLDIRECT 48.0 11/22/2019 10:37 AM   . Pharmacist Clinical Goal(s): o Over the next 180 days, patient will work with PharmD and providers to achieve LDL goal < 70 and TG <150 . Current regimen:  . Atorvastatin 8024maily . Ezetimibe 64m60mily . Fenofibrate 160mg71mly . Vascepa 1g #2 twice daily  . Nitroglycerin 0.4mg a57meeded . Interventions: o Discussed diet and exercise o Discussed risk  associated with elevated triglycerides including pancreatitis  . Patient self care activities - Over the next 90 days, patient will: o Maintain cholesterol medication regimen.   Diabetes Lab Results  Component Value Date/Time   HGBA1C 7.0 (H) 04/18/2020 09:56 AM   HGBA1C 7.0 (H) 11/22/2019 10:37 AM   . Pharmacist Clinical Goal(s): o Over the next 90 days, patient will work with PharmD and providers to achieve A1c goal <7% . Current regimen:  . GlimMarland Kitchenpiride 1mg da56m  . Metformin 1000mg #145mabs AM, and #1 tab PM . Interventions: o Discussed diet and exercise . Patient self care activities - Over the next 90 days, patient will: o Check blood sugar once daily, document, and provide at future appointments o Contact provider with any episodes of hypoglycemia  Heart Failure . Pharmacist Clinical Goal(s) o Over the next 90 days, patient will work with PharmD and providers to reduce symptoms associated with heart failure while  balancing risk benefit of medication therapy . Current regimen:   Torsemide 92m #2 daily  Potassium 2465m #2 twice daily . Interventions: o Discussed kidney function labs o Discussed risk/benefit of furosemide use at high dosages . Patient self care activities - Over the next 90 days, patient will: o Follow up with nephrology and cardiology regarding furosemide dosing  Medication management . Pharmacist Clinical Goal(s): o Over the next 90 days, patient will work with PharmD and providers to maintain optimal medication adherence . [facility] . Interventions o Comprehensive medication review performed. o Continue current medication management strategy o Discussed benefits of medication synchronization, packaging and delivery as well as enhanced pharmacist oversight with [employer]. . 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  Retired at [age]rs old. 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 Divorced Has a life alert Daughter died 2 years ago.  [place].  [place].   Hypertension   BP goal is:  <130/80  Office blood pressures are  BP Readings from Last 3 Encounters:  05/18/20 123/62  04/20/20 (!) 143/70  04/18/20 110/60   Patient checks BP at home 1-2x per week Patient home BP readings are ranging: Unable to assess (states he has not had any BP >1>536ystolic or >7>14iastolic)  Patient has failed these meds in the past: Benazepril, hctz (angioedema) Patient is currently controlled on the following medications:  . Amlodipine 65m80maily  . Metoprolol Tartrate 265m18m.5 tabs twice daily   Diet Cutting sweets, only eating fried foods 1-2x/week, rarely eats sausage (~4x/year) States he is "hooked on cheese" B - cereal (cheerios) w/ 2% milk L - roasted turkKuwaitt w/ cheese, dukes mayo, lettuce, and whole wheat bread D - Olympics - pork chop with salad Snacks - PB before bedtime, eats jalapeno potatoe chips occasionally  Drinks - Diet drinks (Dr. PeppMalachi Bonds Sprite); drinks about 4 per Akeela Busk; "a lot of water" (5-6 bottles per Gilmore List)  Exercise Plays golf 3x/week Dose 60 leg raises and leg bends  We discussed diet and exercise extensively and BP goal   Update 06/14/20 States he's been "laying off of sweets and eating a lot of vegetables" Highest 130 431tolic per patient. He gets "green  lights" for both systolic and diastolic  Plan -Continue current medications   Hyperlipidemia/CAD   LDL goal < 70  Lipid Panel     Component Value Date/Time   CHOL 177 04/18/2020 0956   CHOL 159 01/25/2020 0819   TRIG 745 (H) 04/18/2020 0956   HDL 30 (L) 04/18/2020 0956   HDL 31 (L) 01/25/2020 0819   LDLCALC  04/18/2020 0956     Comment:     . LDL cholesterol not calculated. Triglyceride levels greater than 400 mg/dL invalidate calculated LDL results. . Reference range: <100 . Desirable range <100 mg/dL for primary  prevention;   <70 mg/dL for patients with CHD or diabetic patients  with > or = 2 CHD risk factors. [name] LDL-C is now calculated using the Martin-Hopkins  calculation, which is a validated novel method providing  better accuracy than the Friedewald equation in the  estimation of LDL-C.  [name] et al. [name]. 0211;173(56): 2061-2068  (http://education.[facility].com/faq/FAQ164)    LDLDIRECT 48.0 11/22/2019 1037    Hepatic Function Latest Ref Rng & Units 05/18/2020 04/18/2020 04/12/2020  Total Protein 6.5 - 8.1 g/dL 6.9 6.9 6.7  Albumin 3.5 - 5.0 g/dL 4.5 - 4.5  AST 15 - 41 U/L 15 21 22   ALT 0 - 44 U/L 13 23 24   Alk Phosphatase 38 - 126 U/L 55 - 53  Total Bilirubin 0.3 - 1.2 mg/dL 0.6 0.6 0.5  Bilirubin, Direct 0.0 - 0.3 mg/dL - - -     The ASCVD Risk score (Langdon., et al., 2013) failed to calculate for the following reasons:   The 2013 ASCVD risk score is only valid for ages 43 to 62   The patient has a prior MI or stroke diagnosis   Patient has failed these meds in past: None noted  Patient is currently borderline controlled, except for TRIG on the following medications:  . Atorvastatin 59m daily . Ezetimibe 155mdaily . Fenofibrate 16046maily . Vascepa 1g #2 twice daily  . Nitroglycerin 0.4mg60m needed  Patient unsure if VascDalene Seltzerworking for him. Discussed risks associated with elevated TRIG including pancreatitis  We discussed:   diet and exercise extensively   Update 06/14/20 Lipid clinic??  States he has not been fasting for previous labs Reports his cardiologist recommended he reduce TRIG by diet vs lipid clinic. States he's been "laying off of sweets and eating a lot of vegetables" Wants to follow up with Dr. LownEtter Sjogrenuary to see if Trig have decreased since making diet changes.  Plan -Continue current medications  Diabetes   A1c goal <7%  Recent Relevant Labs: Lab Results  Component Value Date/Time   HGBA1C 7.0 (H) 04/18/2020 09:56 AM   HGBA1C 7.0 (H) 11/22/2019 10:37 AM   GFR 37.32 (L) 11/22/2019 10:37 AM   GFR 41.39 (L) 08/10/2019 01:29 PM   MICROALBUR <0.2 04/18/2020 09:56 AM   MICROALBUR 0.7 08/10/2019 01:29 PM    Last diabetic Eye exam:  Lab Results  Component Value Date/Time   HMDIABEYEEXA No Retinopathy 05/21/2018 12:00 AM    Last diabetic Foot exam: No results found for: HMDIABFOOTEX   Checking BG: Daily  Recent FBG Readings: FBG this AM 180, 140 yesterday  Patient has failed these meds in past: None noted  Patient is currently controlled on the following medications: . Glimepiride 1mg 53mly  . Metformin 1000mg 43m tabs AM, and #1 tab PM  We discussed: A1c goal   Update 06/14/20 A1c stable.  BG 108 fasting this morning Yesterday 127 fasting  Plan -Continue current medications   Heart Failure   Followed by Cardio (Hochrein)  Type: Diastolic  Last ejection fraction: 11/05/2018 50-55%  Patient has failed these meds in past: None noted  Patient is currently controlled on the following medications:  Torsemide 20mg #37mily (changed from furosemide)  Potassium 20mEq #11mice daily  We discussed the dosing of his furosemide and how this is helpful for edema, but could also affect his kidneys. Discussed risk/benefit of furosemide use at high dosages Thoroughly explained the meaning of Creatinine and GFR. Pt was appreciative of explanation.  He is scheduled to see  nephrologist in 2 weeks.   Update 06/14/20 Nephrology switched from furosemide to torsemide? Yes, he states he is feeling great! Reports his legs feel smaller and less swollen Denies shortness of breath He weighs daily States he only fluctuates 1-2lbs, but overall constant    Plan -Continue current medication regimen   Medication Management   [facility] Uses pill box? Yes (Has an AM and PM pill box that he feels every Monday) Pt endorses 100% compliance  Routine: Wake, check sugar, take meds, eat cereal  Goes in donut hole in May.    Meds to D/C from list Doxycycline 172m Hydrocortisone 1% cream Fibercon 6284m Metamucil  We discussed: Discussed benefits of medication synchronization, packaging and delivery as well as enhanced pharmacist oversight with [employer]. Patient really appreciates his pharmacist yet also sees the benefit of having is medication packaged and delivered to him. He will consider pending pricing.   Plan  Continue current medication management strategy   Follow up:  3 month phone visit  Vaccines   Reviewed and discussed patient's vaccination history.    Immunization History  Administered Date(s) Administered  . Influenza Split 06/12/2011, 05/31/2013  . Influenza Whole 05/01/2012  . Influenza, High Dose Seasonal PF 02/09/2015, 03/12/2017, 03/09/2018, 03/02/2020  . Influenza-Unspecified 02/11/2014, 02/29/2016, 02/25/2019  . Moderna Sars-Covid-2 Vaccination 08/30/2019, 09/30/2019  . Pneumococcal Conjugate-13 09/18/2015  . Pneumococcal Polysaccharide-23 06/20/2013  . Td 03/20/2010, 03/16/2013, 09/29/2017  . Zoster Recombinat (Shingrix) 11/21/2016, 09/25/2017   States he received covid booster in September, but can't provide exact date.   KaDe BlanchPharmD, BCACP Clinical Pharmacist [phone]

## 2020-06-14 NOTE — Patient Instructions (Addendum)
Visit Information  Goals Addressed            This Visit's Progress   . Chronic Care Management Pharmacy Care Plan       CARE PLAN ENTRY (see longitudinal plan of care for additional care plan information)  Current Barriers:  . Chronic Disease Management support, education, and care coordination needs related to Hypertension, Hyperlipidemia/CAD, Diabetes, AFib, Heart Failure, Neuropathy, Gout, GERD, Allergic Rhinitis   Hypertension BP Readings from Last 3 Encounters:  05/18/20 123/62  04/20/20 (!) 143/70  04/18/20 110/60   . Pharmacist Clinical Goal(s): o Over the next 90 days, patient will work with PharmD and providers to maintain BP goal <130/80 . Current regimen:  . Amlodipine 5mg  daily  . Metoprolol Tartrate 25mg  #0.5 tabs twice daily  . Interventions: o Discussed BP goal, diet, and exercise . Patient self care activities - Over the next 90 days, patient will: o Check BP 1-2 times per week, document, and provide at future appointments o Ensure daily salt intake < 2300 mg/Richard [name]  Hyperlipidemia/CAD Lab Results  Component Value Date/Time   [facility]  04/18/2020 09:56 AM     Comment:     . LDL cholesterol not calculated. Triglyceride levels greater than 400 mg/dL invalidate calculated LDL results. . Reference range: <100 . Desirable range <100 mg/dL for primary prevention;   <70 mg/dL for patients with CHD or diabetic patients  with > or = 2 CHD risk factors. [name] LDL-C is now calculated using the Martin-Hopkins  calculation, which is a validated novel method providing  better accuracy than the Friedewald equation in the  estimation of LDL-C.  [name] et al. [name]. 9381;017(51): 2061-2068  (http://education.[facility].com/faq/FAQ164)    LDLDIRECT 48.0 11/22/2019 10:37 AM   . Pharmacist Clinical Goal(s): o Over the next 180 days, patient will work with PharmD and providers to achieve LDL goal < 70 and TG <150 . Current regimen:  . Atorvastatin 80mg   daily . Ezetimibe 10mg  daily . Fenofibrate 160mg  daily . Vascepa 1g #2 twice daily  . Nitroglycerin 0.4mg  as needed . Interventions: o Discussed diet and exercise o Discussed risk associated with elevated triglycerides including pancreatitis  . Patient self care activities - Over the next 90 days, patient will: o Maintain cholesterol medication regimen.   Diabetes Lab Results  Component Value Date/Time   HGBA1C 7.0 (H) 04/18/2020 09:56 AM   HGBA1C 7.0 (H) 11/22/2019 10:37 AM   . Pharmacist Clinical Goal(s): o Over the next 90 days, patient will work with PharmD and providers to achieve A1c goal <7% . Current regimen:  [name] Glimepiride 1mg  daily  . Metformin 1000mg  #1.5 tabs AM, and #1 tab PM . Interventions: o Discussed diet and exercise . Patient self care activities - Over the next 90 days, patient will: o Check blood sugar once daily, document, and provide at future appointments o Contact provider with any episodes of hypoglycemia  Heart Failure . Pharmacist Clinical Goal(s) o Over the next 90 days, patient will work with PharmD and providers to reduce symptoms associated with heart failure while balancing risk benefit of medication therapy . Current regimen:   Torsemide 20mg  #2 daily  Potassium 77mEq #2 twice daily . Interventions: o Discussed kidney function labs o Discussed risk/benefit of furosemide use at high dosages . Patient self care activities - Over the next 90 days, patient will: o Follow up with nephrology and cardiology regarding furosemide dosing  Medication management . Pharmacist Clinical Goal(s): o Over the next 90 days, patient will  work with PharmD and providers to maintain optimal medication adherence . [facility] . Interventions o Comprehensive medication review performed. o Continue current medication management strategy o Discussed benefits of medication synchronization, packaging and delivery as well as enhanced pharmacist oversight  with [employer]. . Patient self care activities - Over the next 90 days, patient will: o Focus on medication adherence by filling and taking medications appropriately  o Take medications as prescribed o Report any questions or concerns to PharmD and/or provider(s) o Consider benefits of medication packaging and delivery through [employer]  Please see past updates related to this goal by clicking on the "Past Updates" button in the selected goal         The patient verbalized understanding of instructions, educational materials, and care plan provided today and agreed to receive a mailed copy of patient instructions, educational materials, and care plan.   Telephone follow up appointment with pharmacy team member scheduled for: 09/12/2020  Melvenia Beam Richard [name], [facility]

## 2020-06-20 ENCOUNTER — Other Ambulatory Visit: Payer: Self-pay

## 2020-06-20 ENCOUNTER — Ambulatory Visit (INDEPENDENT_AMBULATORY_CARE_PROVIDER_SITE_OTHER): Payer: Medicare Other | Admitting: Podiatry

## 2020-06-20 ENCOUNTER — Encounter: Payer: Self-pay | Admitting: Podiatry

## 2020-06-20 ENCOUNTER — Inpatient Hospital Stay: Payer: Medicare Other | Attending: Hematology & Oncology

## 2020-06-20 ENCOUNTER — Encounter: Payer: Self-pay | Admitting: Family

## 2020-06-20 ENCOUNTER — Inpatient Hospital Stay (HOSPITAL_BASED_OUTPATIENT_CLINIC_OR_DEPARTMENT_OTHER): Payer: Medicare Other | Admitting: Family

## 2020-06-20 ENCOUNTER — Inpatient Hospital Stay: Payer: Medicare Other

## 2020-06-20 VITALS — BP 145/73 | HR 77 | Temp 98.0°F | Resp 19 | Ht 73.0 in | Wt 257.0 lb

## 2020-06-20 DIAGNOSIS — D519 Vitamin B12 deficiency anemia, unspecified: Secondary | ICD-10-CM

## 2020-06-20 DIAGNOSIS — Z886 Allergy status to analgesic agent status: Secondary | ICD-10-CM | POA: Insufficient documentation

## 2020-06-20 DIAGNOSIS — G629 Polyneuropathy, unspecified: Secondary | ICD-10-CM | POA: Insufficient documentation

## 2020-06-20 DIAGNOSIS — C8332 Diffuse large B-cell lymphoma, intrathoracic lymph nodes: Secondary | ICD-10-CM | POA: Diagnosis not present

## 2020-06-20 DIAGNOSIS — D508 Other iron deficiency anemias: Secondary | ICD-10-CM

## 2020-06-20 DIAGNOSIS — D500 Iron deficiency anemia secondary to blood loss (chronic): Secondary | ICD-10-CM | POA: Diagnosis not present

## 2020-06-20 DIAGNOSIS — E1142 Type 2 diabetes mellitus with diabetic polyneuropathy: Secondary | ICD-10-CM

## 2020-06-20 DIAGNOSIS — B351 Tinea unguium: Secondary | ICD-10-CM | POA: Diagnosis not present

## 2020-06-20 DIAGNOSIS — M79674 Pain in right toe(s): Secondary | ICD-10-CM

## 2020-06-20 DIAGNOSIS — Z95828 Presence of other vascular implants and grafts: Secondary | ICD-10-CM

## 2020-06-20 DIAGNOSIS — I6523 Occlusion and stenosis of bilateral carotid arteries: Secondary | ICD-10-CM | POA: Diagnosis not present

## 2020-06-20 DIAGNOSIS — M79675 Pain in left toe(s): Secondary | ICD-10-CM

## 2020-06-20 DIAGNOSIS — D510 Vitamin B12 deficiency anemia due to intrinsic factor deficiency: Secondary | ICD-10-CM | POA: Insufficient documentation

## 2020-06-20 DIAGNOSIS — Q828 Other specified congenital malformations of skin: Secondary | ICD-10-CM | POA: Diagnosis not present

## 2020-06-20 DIAGNOSIS — C8338 Diffuse large B-cell lymphoma, lymph nodes of multiple sites: Secondary | ICD-10-CM | POA: Insufficient documentation

## 2020-06-20 LAB — CBC WITH DIFFERENTIAL (CANCER CENTER ONLY)
Abs Immature Granulocytes: 0.04 10*3/uL (ref 0.00–0.07)
Basophils Absolute: 0 10*3/uL (ref 0.0–0.1)
Basophils Relative: 1 %
Eosinophils Absolute: 0.1 10*3/uL (ref 0.0–0.5)
Eosinophils Relative: 2 %
HCT: 38.8 % — ABNORMAL LOW (ref 39.0–52.0)
Hemoglobin: 12.4 g/dL — ABNORMAL LOW (ref 13.0–17.0)
Immature Granulocytes: 1 %
Lymphocytes Relative: 23 %
Lymphs Abs: 1.4 10*3/uL (ref 0.7–4.0)
MCH: 29.2 pg (ref 26.0–34.0)
MCHC: 32 g/dL (ref 30.0–36.0)
MCV: 91.3 fL (ref 80.0–100.0)
Monocytes Absolute: 0.4 10*3/uL (ref 0.1–1.0)
Monocytes Relative: 7 %
Neutro Abs: 4 10*3/uL (ref 1.7–7.7)
Neutrophils Relative %: 66 %
Platelet Count: 179 10*3/uL (ref 150–400)
RBC: 4.25 MIL/uL (ref 4.22–5.81)
RDW: 16.5 % — ABNORMAL HIGH (ref 11.5–15.5)
WBC Count: 6 10*3/uL (ref 4.0–10.5)
nRBC: 0 % (ref 0.0–0.2)

## 2020-06-20 LAB — CMP (CANCER CENTER ONLY)
ALT: 21 U/L (ref 0–44)
AST: 19 U/L (ref 15–41)
Albumin: 4.5 g/dL (ref 3.5–5.0)
Alkaline Phosphatase: 54 U/L (ref 38–126)
Anion gap: 8 (ref 5–15)
BUN: 30 mg/dL — ABNORMAL HIGH (ref 8–23)
CO2: 30 mmol/L (ref 22–32)
Calcium: 10.7 mg/dL — ABNORMAL HIGH (ref 8.9–10.3)
Chloride: 101 mmol/L (ref 98–111)
Creatinine: 1.44 mg/dL — ABNORMAL HIGH (ref 0.61–1.24)
GFR, Estimated: 49 mL/min — ABNORMAL LOW (ref >60)
Glucose, Bld: 185 mg/dL — ABNORMAL HIGH (ref 70–99)
Potassium: 4.2 mmol/L (ref 3.5–5.1)
Sodium: 139 mmol/L (ref 135–145)
Total Bilirubin: 0.5 mg/dL (ref 0.3–1.2)
Total Protein: 6.9 g/dL (ref 6.5–8.1)

## 2020-06-20 LAB — VITAMIN B12: Vitamin B-12: 145 pg/mL — ABNORMAL LOW (ref 180–914)

## 2020-06-20 LAB — FERRITIN: Ferritin: 1032 ng/mL — ABNORMAL HIGH (ref 24–336)

## 2020-06-20 LAB — IRON AND TIBC
Iron: 76 ug/dL (ref 42–163)
Saturation Ratios: 21 % (ref 20–55)
TIBC: 369 ug/dL (ref 202–409)
UIBC: 293 ug/dL (ref 117–376)

## 2020-06-20 LAB — RETICULOCYTES
Immature Retic Fract: 25.2 % — ABNORMAL HIGH (ref 2.3–15.9)
RBC.: 4.23 MIL/uL (ref 4.22–5.81)
Retic Count, Absolute: 60.1 10*3/uL (ref 19.0–186.0)
Retic Ct Pct: 1.4 % (ref 0.4–3.1)

## 2020-06-20 MED ORDER — DENOSUMAB 120 MG/1.7ML ~~LOC~~ SOLN
120.0000 mg | Freq: Once | SUBCUTANEOUS | Status: AC
  Administered 2020-06-20: 10:00:00 120 mg via SUBCUTANEOUS

## 2020-06-20 MED ORDER — CYANOCOBALAMIN 1000 MCG/ML IJ SOLN
INTRAMUSCULAR | Status: AC
  Filled 2020-06-20: qty 1

## 2020-06-20 MED ORDER — CYANOCOBALAMIN 1000 MCG/ML IJ SOLN
1000.0000 ug | Freq: Once | INTRAMUSCULAR | Status: AC
  Administered 2020-06-20: 10:00:00 1000 ug via INTRAMUSCULAR

## 2020-06-20 MED ORDER — HEPARIN SOD (PORK) LOCK FLUSH 100 UNIT/ML IV SOLN
500.0000 [IU] | Freq: Once | INTRAVENOUS | Status: AC
  Administered 2020-06-20: 09:00:00 500 [IU] via INTRAVENOUS
  Filled 2020-06-20: qty 5

## 2020-06-20 MED ORDER — SODIUM CHLORIDE 0.9% FLUSH
10.0000 mL | Freq: Once | INTRAVENOUS | Status: AC
  Administered 2020-06-20: 09:00:00 10 mL via INTRAVENOUS
  Filled 2020-06-20: qty 10

## 2020-06-20 MED ORDER — DENOSUMAB 120 MG/1.7ML ~~LOC~~ SOLN
SUBCUTANEOUS | Status: AC
  Filled 2020-06-20: qty 1.7

## 2020-06-20 NOTE — Patient Instructions (Signed)
Tunneled Central Venous Catheter Flushing Guide  It is important to flush your tunneled central venous catheter each time you use it, both before and after you use it. Flushing your catheter will help prevent it from clogging. What are the risks? Risks may include:  Infection.  Air getting into the catheter and bloodstream. Supplies needed:  A clean pair of gloves.  A disinfecting wipe. Use an alcohol wipe, chlorhexidine wipe, or iodine wipe as told by your health care provider.  A 10 mL syringe that has been prefilled with saline solution.  An empty 10 mL syringe, if a substance called heparin was injected into your catheter. How to flush your catheter When you flush your catheter, make sure you follow any specific instructions from your health care provider or the manufacturer. These are general guidelines. Flushing your catheter before use If there is heparin in your catheter: 1. Wash your hands with soap and water. 2. Put on gloves. 3. Scrub the injection cap for a minimum of 15 seconds with a disinfecting wipe. 4. Unclamp the catheter. 5. Attach the empty syringe to the injection cap. 6. Pull the syringe plunger back and withdraw 10 mL of blood. 7. Place the syringe into an appropriate waste container. 8. Scrub the injection cap for 15 seconds with a disinfecting wipe. 9. Attach the prefilled syringe to the injection cap. 10. Flush the catheter by pushing the plunger forward until all the liquid from the syringe is in the catheter. 11. Remove the syringe from the injection cap. 12. Clamp the catheter. If there is no heparin in your catheter: 1. Wash your hands with soap and water. 2. Put on gloves. 3. Scrub the injection cap for 15 seconds with a disinfecting wipe. 4. Unclamp the catheter. 5. Attach the prefilled syringe to the injection cap. 6. Flush the catheter by pushing the plunger forward until 5 mL of the liquid from the syringe is in the catheter. 7. Pull back on  the syringe until you see blood in the catheter. 8. If you have been asked to collect any blood, follow your health care provider's instructions. Otherwise, flush the catheter with the rest of the solution from the syringe. 9. Remove the syringe from the injection cap. 10. Clamp the catheter.  Flushing your catheter after use 1. Wash your hands with soap and water. 2. Put on gloves. 3. Scrub the injection cap for 15 seconds with a disinfecting wipe. 4. Unclamp the catheter. 5. Attach the prefilled syringe to the injection cap. 6. Flush the catheter by pushing the plunger forward until all of the liquid from the syringe is in the catheter. 7. Remove the syringe from the injection cap. 8. Clamp the catheter. Problems and solutions  If blood cannot be completely cleared from the injection cap, you may need to have the injection cap replaced.  If the catheter is difficult to flush, use the pulsing method. The pulsing method involves pushing only a few milliliters of solution into the catheter at a time and pausing between pushes.  If you do not see blood in the catheter when you pull back on the syringe, change your body position, such as by raising your arms above your head. Take a deep breath and cough. Then, pull back on the syringe. If you still do not see blood, flush the catheter with a small amount of solution. Then, change positions again and take a breath or cough. Pull back on the syringe again. If you still do not see   blood, finish flushing the catheter and contact your health care provider. Do not use your catheter until your health care provider says it is okay. General tips  Have someone help you flush your catheter, if possible.  Do not force fluid through your catheter.  Do not use a syringe that is larger or smaller than 10 mL. Using a smaller syringe can make the catheter burst.  Do not use your catheter without flushing it first if it has heparin in it. Contact a health  care provider if:  You cannot see any blood in the catheter when you flush it before using it.  Your catheter is difficult to flush. Get help right away if:  You cannot flush the catheter.  The catheter leaks when you flush it or when there is fluid in it.  There are cracks or breaks in the catheter. Summary  It is important to flush your tunneled central venous catheter each time you use it, both before and after you use it.  Scrub the injection cap for 15 seconds with a disinfecting wipe before and after you flush it.  When you flush your catheter, make sure you follow any specific instructions from your health care provider or the manufacturer.  Get help right away if you cannot flush the catheter. This information is not intended to replace advice given to you by your health care provider. Make sure you discuss any questions you have with your health care provider. Document Revised: 03/12/2019 Document Reviewed: 09/02/2018 Elsevier Patient Education  2020 [employer]  

## 2020-06-20 NOTE — Patient Instructions (Signed)
Denosumab injection What is this medicine? DENOSUMAB (den oh sue mab) slows bone breakdown. Prolia is used to treat osteoporosis in women after menopause and in men, and in people who are taking corticosteroids for 6 months or more. Delton See is used to treat a high calcium level due to cancer and to prevent bone fractures and other bone problems caused by multiple myeloma or cancer bone metastases. Delton See is also used to treat giant cell tumor of the bone. This medicine may be used for other purposes; ask your health care provider or pharmacist if you have questions. COMMON BRAND NAME(S): Prolia, XGEVA What should I tell my health care provider before I take this medicine? They need to know if you have any of these conditions:  dental disease  having surgery or tooth extraction  infection  kidney disease  low levels of calcium or Vitamin D in the blood  malnutrition  on hemodialysis  skin conditions or sensitivity  thyroid or parathyroid disease  an unusual reaction to denosumab, other medicines, foods, dyes, or preservatives  pregnant or trying to get pregnant  breast-feeding How should I use this medicine? This medicine is for injection under the skin. It is given by a health care professional in a hospital or clinic setting. A special MedGuide will be given to you before each treatment. Be sure to read this information carefully each time. For Prolia, talk to your pediatrician regarding the use of this medicine in children. Special care may be needed. For Delton See, talk to your pediatrician regarding the use of this medicine in children. While this drug may be prescribed for children as young as 13 years for selected conditions, precautions do apply. Overdosage: If you think you have taken too much of this medicine contact a poison control center or emergency room at once. NOTE: This medicine is only for you. Do not share this medicine with others. What if I miss a dose? It is  important not to miss your dose. Call your doctor or health care professional if you are unable to keep an appointment. What may interact with this medicine? Do not take this medicine with any of the following medications:  other medicines containing denosumab This medicine may also interact with the following medications:  medicines that lower your chance of fighting infection  steroid medicines like prednisone or cortisone This list may not describe all possible interactions. Give your health care provider a list of all the medicines, herbs, non-prescription drugs, or dietary supplements you use. Also tell them if you smoke, drink alcohol, or use illegal drugs. Some items may interact with your medicine. What should I watch for while using this medicine? Visit your doctor or health care professional for regular checks on your progress. Your doctor or health care professional may order blood tests and other tests to see how you are doing. Call your doctor or health care professional for advice if you get a fever, chills or sore throat, or other symptoms of a cold or flu. Do not treat yourself. This drug may decrease your body's ability to fight infection. Try to avoid being around people who are sick. You should make sure you get enough calcium and vitamin D while you are taking this medicine, unless your doctor tells you not to. Discuss the foods you eat and the vitamins you take with your health care professional. See your dentist regularly. Brush and floss your teeth as directed. Before you have any dental work done, tell your dentist you are  receiving this medicine. Do not become pregnant while taking this medicine or for 5 months after stopping it. Talk with your doctor or health care professional about your birth control options while taking this medicine. Women should inform their doctor if they wish to become pregnant or think they might be pregnant. There is a potential for serious side  effects to an unborn child. Talk to your health care professional or pharmacist for more information. What side effects may I notice from receiving this medicine? Side effects that you should report to your doctor or health care professional as soon as possible:  allergic reactions like skin rash, itching or hives, swelling of the face, lips, or tongue  bone pain  breathing problems  dizziness  jaw pain, especially after dental work  redness, blistering, peeling of the skin  signs and symptoms of infection like fever or chills; cough; sore throat; pain or trouble passing urine  signs of low calcium like fast heartbeat, muscle cramps or muscle pain; pain, tingling, numbness in the hands or feet; seizures  unusual bleeding or bruising  unusually weak or tired Side effects that usually do not require medical attention (report to your doctor or health care professional if they continue or are bothersome):  constipation  diarrhea  headache  joint pain  loss of appetite  muscle pain  runny nose  tiredness  upset stomach This list may not describe all possible side effects. Call your doctor for medical advice about side effects. You may report side effects to FDA at 1-800-FDA-1088. Where should I keep my medicine? This medicine is only given in a clinic, doctor's office, or other health care setting and will not be stored at home. NOTE: This sheet is a summary. It may not cover all possible information. If you have questions about this medicine, talk to your doctor, pharmacist, or health care provider.  2020 Elsevier/Gold Standard (2017-10-24 16:10:44) Cyanocobalamin, Vitamin B12 injection What is this medicine? CYANOCOBALAMIN (sye an oh koe BAL a min) is a man made form of vitamin B12. Vitamin B12 is used in the growth of healthy blood cells, nerve cells, and proteins in the body. It also helps with the metabolism of fats and carbohydrates. This medicine is used to treat  people who can not absorb vitamin B12. This medicine may be used for other purposes; ask your health care provider or pharmacist if you have questions. COMMON BRAND NAME(S): B-12 Compliance Kit, B-12 Injection Kit, Cyomin, LA-12, Nutri-Twelve, Physicians EZ Use B-12, Primabalt What should I tell my health care provider before I take this medicine? They need to know if you have any of these conditions:  kidney disease  Leber's disease  megaloblastic anemia  an unusual or allergic reaction to cyanocobalamin, cobalt, other medicines, foods, dyes, or preservatives  pregnant or trying to get pregnant  breast-feeding How should I use this medicine? This medicine is injected into a muscle or deeply under the skin. It is usually given by a health care professional in a clinic or doctor's office. However, your doctor may teach you how to inject yourself. Follow all instructions. Talk to your pediatrician regarding the use of this medicine in children. Special care may be needed. Overdosage: If you think you have taken too much of this medicine contact a poison control center or emergency room at once. NOTE: This medicine is only for you. Do not share this medicine with others. What if I miss a dose? If you are given your dose at a  clinic or doctor's office, call to reschedule your appointment. If you give your own injections and you miss a dose, take it as soon as you can. If it is almost time for your next dose, take only that dose. Do not take double or extra doses. What may interact with this medicine?  colchicine  heavy alcohol intake This list may not describe all possible interactions. Give your health care provider a list of all the medicines, herbs, non-prescription drugs, or dietary supplements you use. Also tell them if you smoke, drink alcohol, or use illegal drugs. Some items may interact with your medicine. What should I watch for while using this medicine? Visit your doctor or  health care professional regularly. You may need blood work done while you are taking this medicine. You may need to follow a special diet. Talk to your doctor. Limit your alcohol intake and avoid smoking to get the best benefit. What side effects may I notice from receiving this medicine? Side effects that you should report to your doctor or health care professional as soon as possible:  allergic reactions like skin rash, itching or hives, swelling of the face, lips, or tongue  blue tint to skin  chest tightness, pain  difficulty breathing, wheezing  dizziness  red, swollen painful area on the leg Side effects that usually do not require medical attention (report to your doctor or health care professional if they continue or are bothersome):  diarrhea  headache This list may not describe all possible side effects. Call your doctor for medical advice about side effects. You may report side effects to FDA at 1-800-FDA-1088. Where should I keep my medicine? Keep out of the reach of children. Store at room temperature between 15 and 30 degrees C (59 and 85 degrees F). Protect from light. Throw away any unused medicine after the expiration date. NOTE: This sheet is a summary. It may not cover all possible information. If you have questions about this medicine, talk to your doctor, pharmacist, or health care provider.  2020 Elsevier/Gold Standard (2007-09-28 22:10:20)

## 2020-06-20 NOTE — Progress Notes (Signed)
This patient returns to my office for at risk foot care.  This patient requires this care by a professional since this patient will be at risk due to having diabetic neuropathy  and coagulation defect.  Patient is taking eliquis. This patient is unable to cut nails himself since the patient cannot reach his nails.These nails are painful walking and wearing shoes.  Patient says the callus on his right forefoot has become painful when walking.  This patient presents for at risk foot care today.  General Appearance  Alert, conversant and in no acute stress.  Vascular  Dorsalis pedis and posterior tibial  pulses are palpable  bilaterally.  Capillary return is within normal limits  bilaterally. Temperature is within normal limits  bilaterally.  Neurologic  Senn-Weinstein monofilament wire test within normal limits  Left foot.  LOPS right foot is absent.  . Muscle power within normal limits bilaterally.  Nails Thick disfigured discolored nails with subungual debris  from hallux to fifth toes bilaterally. No evidence of bacterial infection or drainage bilaterally.  Orthopedic  No limitations of motion  feet .  No crepitus or effusions noted.  No bony pathology or digital deformities noted.  Plantar flexed fifth metatarsal right foot.  Skin  normotropic skin with noted bilaterally.  No signs of infections or ulcers noted.   Symptomatic porokeratosis  Right foot.  Onychomycosis  Pain in right toes  Pain in left toes  Porokeratosis sub 5th right  Consent was obtained for treatment procedures.   Mechanical debridement of nails 1-5  bilaterally performed with a nail nipper.  Filed with dremel without incident. Debride with # 15 blade.  Padding dispensed.   Return office visit   12 weeks.                  Told patient to return for periodic foot care and evaluation due to potential at risk complications.   Gardiner Barefoot DPM

## 2020-06-20 NOTE — Progress Notes (Signed)
Hematology and Oncology Follow Up Visit  Richard [name] [id number] 1941-03-30 79 y.o. 06/20/2020   Principle Diagnosis:  Diffuse large cell non-Hodgkin's lymphoma (IPI = 3) - NOT "double hit" Pernicious anemia Iron deficiency secondary to bleeding  Past Therapy: R-CHOP - s/p cycle8 - completed 08/2017  Current Therapy: Vitamin B12 1 mg IM every month Xgeva 120 mg subcu q 3 months - next doseduein 08/2020 IV ironas indicated    Interim History:  Richard [name] is here today for follow-up and treatment. He is doing very well and has no complaints at this time.  He has been playing golf 3 days a week and denies fatigue.  He gets an injection in his back every 9 weeks or so with ortho which helps maintain his mobility.  No blood loss noted. No abnormal bruising, no petechiae.  No fever, chills, n/v, cough, dizziness, SOB, chest pain, palpitations, abdominal pain or changes in bowel or bladder habits.  Fluid retention in his lower extremities is minimal and stable.  Neuropathy in the feet is unchanged. No falls or syncope. Gait is stable.  He has maintained a good appetite and is staying hydrated throughout the day. His weight is unchanged at 257 lbs.   ECOG Performance Status: 1 - Symptomatic but completely ambulatory  Medications:  Allergies as of 06/20/2020      Reactions   Benazepril Swelling   angioedema; he is not a candidate for any angiotensin receptor blockers because of this significant allergic reaction. Because of a history of documented adverse serious drug reaction;Medi Alert bracelet  is recommended   Hctz [hydrochlorothiazide] Anaphylaxis, Swelling   Tongue and lip swelling   Aspirin Other (See Comments)   Gastritis, cant take 325 Mg aspirin    Lactose Intolerance (gi) Nausea And Vomiting      Medication List       Accurate as of June 20, 2020  9:15 AM. If you have any questions, ask your nurse or doctor.        allopurinol 300 MG  tablet Commonly known as: ZYLOPRIM Take 450 mg by mouth daily.   aluminum hydroxide-magnesium carbonate 95-358 MG/15ML Susp Commonly known as: GAVISCON Take 15 mLs by mouth as needed for indigestion or heartburn.   amLODipine 5 MG tablet Commonly known as: NORVASC TAKE 1 TABLET BY MOUTH TWICE A DAY   atorvastatin 80 MG tablet Commonly known as: LIPITOR TAKE ONE TABLET BY MOUTH AT BEDTIME   azelastine 0.1 % nasal spray Commonly known as: ASTELIN Place 2 sprays into both nostrils at bedtime as needed for rhinitis or allergies.   Eliquis 2.5 MG Tabs tablet Generic drug: apixaban TAKE 1 TABLET BY MOUTH TWICE A DAY   esomeprazole 40 MG capsule Commonly known as: NexIUM Take 1 capsule (40 mg total) by mouth daily.   ezetimibe 10 MG tablet Commonly known as: ZETIA TAKE 1 TABLET BY MOUTH EVERY DAY   fenofibrate 160 MG tablet Take 1 tablet (160 mg total) by mouth daily.   folic acid 1 MG tablet Commonly known as: FOLVITE TAKE 2 TABLETS BY MOUTH EVERY DAY   freestyle lancets USE TWICE A DAY TO CHECK BLOOD SUGAR. DX E11.9   FREESTYLE LITE test strip Generic drug: glucose blood USE TO TEST BLOOD SUGAR ONCE A DAY. DX CODE: E11.9   glimepiride 1 MG tablet Commonly known as: AMARYL TAKE 1 TABLET (1 MG TOTAL) BY MOUTH DAILY WITH BREAKFAST.   hydrocortisone cream 1 % Apply 1 application topically 2 (two) times daily.  metFORMIN 1000 MG tablet Commonly known as: GLUCOPHAGE TAKE 1 & 1/2 TABLETS BY MOUTH EVERY MORNING AND 1 TABLET IN THE EVENING.   metoprolol tartrate 25 MG tablet Commonly known as: LOPRESSOR TAKE ONE-HALF TABLET BY MOUTH TWICE DAILY   nitroGLYCERIN 0.4 MG SL tablet Commonly known as: NITROSTAT PLACE 1 TABLET (0.4 MG TOTAL) UNDER THE TONGUE EVERY 5 (FIVE) MINUTES AS NEEDED FOR CHEST PAIN.   polycarbophil 625 MG tablet Commonly known as: FIBERCON Take 625 mg by mouth daily.   potassium chloride SA 20 MEQ tablet Commonly known as: Klor-Con M20 Take 2  tablets (40 mEq total) by mouth 2 (two) times daily.   pregabalin 150 MG capsule Commonly known as: LYRICA TAKE 1 CAPSULE BY MOUTH TWICE A DAY   psyllium 58.6 % powder Commonly known as: METAMUCIL Take 1 packet by mouth daily as needed (constipation).   torsemide 20 MG tablet Commonly known as: DEMADEX Take 40 mg by mouth daily.   Vascepa 1 g capsule Generic drug: icosapent Ethyl TAKE 2 CAPSULES BY MOUTH TWICE A DAY       Allergies:  Allergies  Allergen Reactions  . Benazepril Swelling    angioedema; he is not a candidate for any angiotensin receptor blockers because of this significant allergic reaction. Because of a history of documented adverse serious drug reaction;Medi Alert bracelet  is recommended  . Hctz [Hydrochlorothiazide] Anaphylaxis and Swelling    Tongue and lip swelling   . Aspirin Other (See Comments)    Gastritis, cant take 325 Mg aspirin   . Lactose Intolerance (Gi) Nausea And Vomiting    Past Medical History, Surgical history, Social history, and Family History were reviewed and updated.  Review of Systems: All other 10 point review of systems is negative.   Physical Exam:  height is 6\' 1"  (1.854 m) and weight is 257 lb (116.6 kg). His oral temperature is 98 F (36.7 C). His blood pressure is 145/73 (abnormal) and his pulse is 77. His respiration is 19 and oxygen saturation is 99%.   Wt Readings from Last 3 Encounters:  06/20/20 257 lb (116.6 kg)  05/18/20 256 lb 8 oz (116.3 kg)  04/18/20 248 lb (112.5 kg)    Ocular: Sclerae unicteric, pupils equal, round and reactive to light Ear-nose-throat: Oropharynx clear, dentition fair Lymphatic: No cervical or supraclavicular adenopathy Lungs no rales or rhonchi, good excursion bilaterally Heart regular rate and rhythm, no murmur appreciated Abd soft, nontender, positive bowel sounds MSK no focal spinal tenderness, no joint edema Neuro: non-focal, well-oriented, appropriate affect Breasts: Deferred    Lab Results  Component Value Date   WBC 6.0 06/20/2020   HGB 12.4 (L) 06/20/2020   HCT 38.8 (L) 06/20/2020   MCV 91.3 06/20/2020   PLT 179 06/20/2020   Lab Results  Component Value Date   FERRITIN 948 (H) 05/18/2020   IRON 78 05/18/2020   TIBC 339 05/18/2020   UIBC 261 05/18/2020   IRONPCTSAT 23 05/18/2020   Lab Results  Component Value Date   RETICCTPCT 1.7 05/18/2020   RBC 4.25 06/20/2020   RETICCTABS 52.1 11/22/2011   No results found for: [facility] Lab Results  Component Value Date   IGA 159 03/09/2012   Lab Results  Component Value Date   ALBUMINELP 4.2 07/27/2018   MSPIKE Not Observed 07/27/2018     Chemistry      Component Value Date/Time   NA 139 05/18/2020 1000   NA 136 03/16/2019 0938   NA 144 06/27/2017  0857   K 4.2 05/18/2020 1000   K 3.9 06/27/2017 0857   CL 103 05/18/2020 1000   CL 103 06/27/2017 0857   CO2 28 05/18/2020 1000   CO2 26 06/27/2017 0857   BUN 30 (H) 05/18/2020 1000   BUN 34 (H) 03/16/2019 0938   BUN 12 06/27/2017 0857   CREATININE 1.59 (H) 05/18/2020 1000   CREATININE 1.65 (H) 04/18/2020 0956      Component Value Date/Time   CALCIUM 10.1 05/18/2020 1000   CALCIUM 9.2 06/27/2017 0857   ALKPHOS 55 05/18/2020 1000   ALKPHOS 128 (H) 06/27/2017 0857   AST 15 05/18/2020 1000   ALT 13 05/18/2020 1000   ALT 24 06/27/2017 0857   BILITOT 0.6 05/18/2020 1000       Impression and Plan: Mr. Kimmey is a very pleasant 79yo caucasian gentleman withdiffuse large B-cell lymphoma (not"double hit"lymphoma). He completed treatment in February 2019. Her received B 12 and Xgeva today.  We will see what his iron studies look like and replace if needed.  Follow-up in 1 months.  He can contact our office with any questions or concerns.   Laverna Peace, NP 12/21/20219:15 AM

## 2020-06-20 NOTE — Addendum Note (Signed)
Addended by: Amelia Jo I on: 06/20/2020 09:00 AM   Modules accepted: Orders

## 2020-07-04 ENCOUNTER — Other Ambulatory Visit: Payer: Medicare Other

## 2020-07-04 DIAGNOSIS — Z20822 Contact with and (suspected) exposure to covid-19: Secondary | ICD-10-CM | POA: Diagnosis not present

## 2020-07-06 LAB — SARS-COV-2, NAA 2 DAY TAT

## 2020-07-06 LAB — NOVEL CORONAVIRUS, NAA: SARS-CoV-2, NAA: NOT DETECTED

## 2020-07-18 ENCOUNTER — Other Ambulatory Visit: Payer: Self-pay

## 2020-07-18 ENCOUNTER — Ambulatory Visit (INDEPENDENT_AMBULATORY_CARE_PROVIDER_SITE_OTHER): Payer: Medicare Other

## 2020-07-18 DIAGNOSIS — L570 Actinic keratosis: Secondary | ICD-10-CM

## 2020-07-18 MED ORDER — AMINOLEVULINIC ACID HCL 20 % EX SOLR
1.0000 "application " | Freq: Once | CUTANEOUS | Status: AC
  Administered 2020-07-18: 354 mg via TOPICAL

## 2020-07-18 NOTE — Progress Notes (Signed)
Patient completed PDT therapy today. ° °1. AK (actinic keratosis) °Head - Anterior (Face) ° °Photodynamic therapy - Head - Anterior (Face) °Procedure discussed: discussed risks, benefits, side effects. and alternatives   °Prep: site scrubbed/prepped with acetone   °Location:  Face °Number of lesions:  Multiple °Type of treatment:  Blue light °Aminolevulinic Acid (see MAR for details): Levulan °Number of Levulan sticks used:  1 °Incubation time (minutes):  60 °Number of minutes under lamp:  16 °Number of seconds under lamp:  40 °Cooling:  Floor fan °Outcome: patient tolerated procedure well with no complications   °Post-procedure details: sunscreen applied   ° °Aminolevulinic Acid HCl 20 % SOLR 354 mg - Head - Anterior (Face) ° ° ° °

## 2020-07-19 DIAGNOSIS — M7061 Trochanteric bursitis, right hip: Secondary | ICD-10-CM | POA: Diagnosis not present

## 2020-07-19 DIAGNOSIS — M7062 Trochanteric bursitis, left hip: Secondary | ICD-10-CM | POA: Diagnosis not present

## 2020-07-19 DIAGNOSIS — Z96652 Presence of left artificial knee joint: Secondary | ICD-10-CM | POA: Diagnosis not present

## 2020-07-25 ENCOUNTER — Inpatient Hospital Stay: Payer: Medicare Other

## 2020-07-25 ENCOUNTER — Other Ambulatory Visit: Payer: Self-pay

## 2020-07-25 ENCOUNTER — Telehealth: Payer: Self-pay

## 2020-07-25 ENCOUNTER — Inpatient Hospital Stay (HOSPITAL_BASED_OUTPATIENT_CLINIC_OR_DEPARTMENT_OTHER): Payer: Medicare Other | Admitting: Family

## 2020-07-25 ENCOUNTER — Encounter: Payer: Self-pay | Admitting: Family

## 2020-07-25 ENCOUNTER — Inpatient Hospital Stay: Payer: Medicare Other | Attending: Hematology & Oncology

## 2020-07-25 VITALS — BP 127/64 | HR 70 | Temp 97.6°F | Resp 18 | Wt 258.0 lb

## 2020-07-25 DIAGNOSIS — D510 Vitamin B12 deficiency anemia due to intrinsic factor deficiency: Secondary | ICD-10-CM | POA: Insufficient documentation

## 2020-07-25 DIAGNOSIS — C8332 Diffuse large B-cell lymphoma, intrathoracic lymph nodes: Secondary | ICD-10-CM

## 2020-07-25 DIAGNOSIS — D519 Vitamin B12 deficiency anemia, unspecified: Secondary | ICD-10-CM

## 2020-07-25 DIAGNOSIS — Z886 Allergy status to analgesic agent status: Secondary | ICD-10-CM | POA: Insufficient documentation

## 2020-07-25 DIAGNOSIS — D508 Other iron deficiency anemias: Secondary | ICD-10-CM | POA: Diagnosis not present

## 2020-07-25 DIAGNOSIS — D500 Iron deficiency anemia secondary to blood loss (chronic): Secondary | ICD-10-CM | POA: Diagnosis not present

## 2020-07-25 DIAGNOSIS — G629 Polyneuropathy, unspecified: Secondary | ICD-10-CM | POA: Diagnosis not present

## 2020-07-25 DIAGNOSIS — C8338 Diffuse large B-cell lymphoma, lymph nodes of multiple sites: Secondary | ICD-10-CM | POA: Diagnosis not present

## 2020-07-25 LAB — CBC WITH DIFFERENTIAL (CANCER CENTER ONLY)
Abs Immature Granulocytes: 0.04 10*3/uL (ref 0.00–0.07)
Basophils Absolute: 0 10*3/uL (ref 0.0–0.1)
Basophils Relative: 1 %
Eosinophils Absolute: 0.1 10*3/uL (ref 0.0–0.5)
Eosinophils Relative: 2 %
HCT: 38.1 % — ABNORMAL LOW (ref 39.0–52.0)
Hemoglobin: 12.3 g/dL — ABNORMAL LOW (ref 13.0–17.0)
Immature Granulocytes: 1 %
Lymphocytes Relative: 25 %
Lymphs Abs: 1.7 10*3/uL (ref 0.7–4.0)
MCH: 28.7 pg (ref 26.0–34.0)
MCHC: 32.3 g/dL (ref 30.0–36.0)
MCV: 88.8 fL (ref 80.0–100.0)
Monocytes Absolute: 0.5 10*3/uL (ref 0.1–1.0)
Monocytes Relative: 7 %
Neutro Abs: 4.4 10*3/uL (ref 1.7–7.7)
Neutrophils Relative %: 64 %
Platelet Count: 202 10*3/uL (ref 150–400)
RBC: 4.29 MIL/uL (ref 4.22–5.81)
RDW: 15.9 % — ABNORMAL HIGH (ref 11.5–15.5)
WBC Count: 6.8 10*3/uL (ref 4.0–10.5)
nRBC: 0 % (ref 0.0–0.2)

## 2020-07-25 LAB — RETICULOCYTES
Immature Retic Fract: 28.3 % — ABNORMAL HIGH (ref 2.3–15.9)
RBC.: 4.26 MIL/uL (ref 4.22–5.81)
Retic Count, Absolute: 75.4 10*3/uL (ref 19.0–186.0)
Retic Ct Pct: 1.8 % (ref 0.4–3.1)

## 2020-07-25 LAB — CMP (CANCER CENTER ONLY)
ALT: 19 U/L (ref 0–44)
AST: 21 U/L (ref 15–41)
Albumin: 4.5 g/dL (ref 3.5–5.0)
Alkaline Phosphatase: 57 U/L (ref 38–126)
Anion gap: 12 (ref 5–15)
BUN: 38 mg/dL — ABNORMAL HIGH (ref 8–23)
CO2: 25 mmol/L (ref 22–32)
Calcium: 9.8 mg/dL (ref 8.9–10.3)
Chloride: 100 mmol/L (ref 98–111)
Creatinine: 2.03 mg/dL — ABNORMAL HIGH (ref 0.61–1.24)
GFR, Estimated: 32 mL/min — ABNORMAL LOW (ref >60)
Glucose, Bld: 191 mg/dL — ABNORMAL HIGH (ref 70–99)
Potassium: 4.2 mmol/L (ref 3.5–5.1)
Sodium: 137 mmol/L (ref 135–145)
Total Bilirubin: 0.4 mg/dL (ref 0.3–1.2)
Total Protein: 7.2 g/dL (ref 6.5–8.1)

## 2020-07-25 LAB — FERRITIN: Ferritin: 1227 ng/mL — ABNORMAL HIGH (ref 24–336)

## 2020-07-25 LAB — IRON AND TIBC
Iron: 77 ug/dL (ref 42–163)
Saturation Ratios: 21 % (ref 20–55)
TIBC: 370 ug/dL (ref 202–409)
UIBC: 293 ug/dL (ref 117–376)

## 2020-07-25 LAB — VITAMIN B12: Vitamin B-12: 146 pg/mL — ABNORMAL LOW (ref 180–914)

## 2020-07-25 MED ORDER — CYANOCOBALAMIN 1000 MCG/ML IJ SOLN
1000.0000 ug | Freq: Once | INTRAMUSCULAR | Status: AC
  Administered 2020-07-25: 1000 ug via INTRAMUSCULAR

## 2020-07-25 MED ORDER — HEPARIN SOD (PORK) LOCK FLUSH 100 UNIT/ML IV SOLN
500.0000 [IU] | Freq: Once | INTRAVENOUS | Status: AC | PRN
  Administered 2020-07-25: 500 [IU]
  Filled 2020-07-25: qty 5

## 2020-07-25 MED ORDER — CYANOCOBALAMIN 1000 MCG/ML IJ SOLN
INTRAMUSCULAR | Status: AC
  Filled 2020-07-25: qty 1

## 2020-07-25 MED ORDER — SODIUM CHLORIDE 0.9% FLUSH
10.0000 mL | INTRAVENOUS | Status: DC | PRN
  Administered 2020-07-25: 10 mL
  Filled 2020-07-25: qty 10

## 2020-07-25 NOTE — Patient Instructions (Signed)

## 2020-07-25 NOTE — Patient Instructions (Signed)

## 2020-07-25 NOTE — Telephone Encounter (Signed)
appts made and printed for pt per 07/25/20 los   Richard [name] 

## 2020-07-25 NOTE — Progress Notes (Signed)
Hematology and Oncology Follow Up Visit  Richard [name] [id number] [birth year] 80 y.o. 07/25/2020   Principle Diagnosis:  Diffuse large cell non-Hodgkin's lymphoma (IPI = 3) - NOT "double hit" Pernicious anemia Iron deficiency secondary to bleeding  Past Therapy: R-CHOP - s/p cycle8 - completed 08/2017  Current Therapy: Vitamin B12 1 mg IM every month Xgeva 120 mg subcu q 3 months - next doseduein03/2022 IV ironas indicated   Interim History:  Mr. Hayduk is here today for follow-up and B 12 injection. He is doing well and has no complaints at this time.  He has not been able to golf for the last week or so due to snow. He is hoping to get back on the course next week.  He denies fatigue.  No blood loss noted. No abnormal bruising, no petechiae.  No fever, chills, n/v, cough, rash, dizziness, SOB, chest pain, palpitations, abdominal pain or changes in bowel or bladder habits.  The swelling and neuropathy in his lower extremities is stable/unchanged.  No falls or syncope to report.  He has maintained a good appetite and is staying well hydrated throughout the day.   ECOG Performance Status: 1 - Symptomatic but completely ambulatory  Medications:  Allergies as of 07/25/2020      Reactions   Benazepril Swelling   angioedema; he is not a candidate for any angiotensin receptor blockers because of this significant allergic reaction. Because of a history of documented adverse serious drug reaction;Medi Alert bracelet  is recommended   Hctz [hydrochlorothiazide] Anaphylaxis, Swelling   Tongue and lip swelling   Aspirin Other (See Comments)   Gastritis, cant take 325 Mg aspirin    Lactose Intolerance (gi) Nausea And Vomiting      Medication List       Accurate as of July 25, 2020  8:55 AM. If you have any questions, ask your nurse or doctor.        allopurinol 300 MG tablet Commonly known as: ZYLOPRIM Take 450 mg by mouth daily.   aluminum  hydroxide-magnesium carbonate 95-358 MG/15ML Susp Commonly known as: GAVISCON Take 15 mLs by mouth as needed for indigestion or heartburn.   amLODipine 5 MG tablet Commonly known as: NORVASC TAKE 1 TABLET BY MOUTH TWICE A DAY   atorvastatin 80 MG tablet Commonly known as: LIPITOR TAKE ONE TABLET BY MOUTH AT BEDTIME   azelastine 0.1 % nasal spray Commonly known as: ASTELIN Place 2 sprays into both nostrils at bedtime as needed for rhinitis or allergies.   Eliquis 2.5 MG Tabs tablet Generic drug: apixaban TAKE 1 TABLET BY MOUTH TWICE A DAY   esomeprazole 40 MG capsule Commonly known as: NexIUM Take 1 capsule (40 mg total) by mouth daily.   ezetimibe 10 MG tablet Commonly known as: ZETIA TAKE 1 TABLET BY MOUTH EVERY DAY   fenofibrate 160 MG tablet Take 1 tablet (160 mg total) by mouth daily.   folic acid 1 MG tablet Commonly known as: FOLVITE TAKE 2 TABLETS BY MOUTH EVERY DAY   freestyle lancets USE TWICE A DAY TO CHECK BLOOD SUGAR. DX E11.9   FREESTYLE LITE test strip Generic drug: glucose blood USE TO TEST BLOOD SUGAR ONCE A DAY. DX CODE: E11.9   glimepiride 1 MG tablet Commonly known as: AMARYL TAKE 1 TABLET (1 MG TOTAL) BY MOUTH DAILY WITH BREAKFAST.   hydrocortisone cream 1 % Apply 1 application topically 2 (two) times daily.   metFORMIN 1000 MG tablet Commonly known as: GLUCOPHAGE TAKE 1 & 1/2  TABLETS BY MOUTH EVERY MORNING AND 1 TABLET IN THE EVENING.   metoprolol tartrate 25 MG tablet Commonly known as: LOPRESSOR TAKE ONE-HALF TABLET BY MOUTH TWICE DAILY   nitroGLYCERIN 0.4 MG SL tablet Commonly known as: NITROSTAT PLACE 1 TABLET (0.4 MG TOTAL) UNDER THE TONGUE EVERY 5 (FIVE) MINUTES AS NEEDED FOR CHEST PAIN.   polycarbophil 625 MG tablet Commonly known as: FIBERCON Take 625 mg by mouth daily.   potassium chloride SA 20 MEQ tablet Commonly known as: Klor-Con M20 Take 2 tablets (40 mEq total) by mouth 2 (two) times daily.   pregabalin 150 MG  capsule Commonly known as: LYRICA TAKE 1 CAPSULE BY MOUTH TWICE A DAY   psyllium 58.6 % powder Commonly known as: METAMUCIL Take 1 packet by mouth daily as needed (constipation).   torsemide 20 MG tablet Commonly known as: DEMADEX Take 40 mg by mouth daily.   Vascepa 1 g capsule Generic drug: icosapent Ethyl TAKE 2 CAPSULES BY MOUTH TWICE A DAY       Allergies:  Allergies  Allergen Reactions  . Benazepril Swelling    angioedema; he is not a candidate for any angiotensin receptor blockers because of this significant allergic reaction. Because of a history of documented adverse serious drug reaction;Medi Alert bracelet  is recommended  . Hctz [Hydrochlorothiazide] Anaphylaxis and Swelling    Tongue and lip swelling   . Aspirin Other (See Comments)    Gastritis, cant take 325 Mg aspirin   . Lactose Intolerance (Gi) Nausea And Vomiting    Past Medical History, Surgical history, Social history, and Family History were reviewed and updated.  Review of Systems: All other 10 point review of systems is negative.   Physical Exam:  vitals were not taken for this visit.   Wt Readings from Last 3 Encounters:  06/20/20 257 lb (116.6 kg)  05/18/20 256 lb 8 oz (116.3 kg)  04/18/20 248 lb (112.5 kg)    Ocular: Sclerae unicteric, pupils equal, round and reactive to light Ear-nose-throat: Oropharynx clear, dentition fair Lymphatic: No cervical or supraclavicular adenopathy Lungs no rales or rhonchi, good excursion bilaterally Heart regular rate and rhythm, no murmur appreciated Abd soft, nontender, positive bowel sounds MSK no focal spinal tenderness, no joint edema Neuro: non-focal, well-oriented, appropriate affect Breasts: Deferred   Lab Results  Component Value Date   WBC 6.0 06/20/2020   HGB 12.4 (L) 06/20/2020   HCT 38.8 (L) 06/20/2020   MCV 91.3 06/20/2020   PLT 179 06/20/2020   Lab Results  Component Value Date   FERRITIN 1,032 (H) 06/20/2020   IRON 76  06/20/2020   TIBC 369 06/20/2020   UIBC 293 06/20/2020   IRONPCTSAT 21 06/20/2020   Lab Results  Component Value Date   RETICCTPCT 1.4 06/20/2020   RBC 4.23 06/20/2020   RETICCTABS 52.1 11/22/2011   No results found for: Nils Pyle [facility] Lab Results  Component Value Date   IGA 159 03/09/2012   Lab Results  Component Value Date   ALBUMINELP 4.2 07/27/2018   MSPIKE Not Observed 07/27/2018     Chemistry      Component Value Date/Time   NA 139 06/20/2020 0826   NA 136 03/16/2019 0938   NA 144 06/27/2017 0857   K 4.2 06/20/2020 0826   K 3.9 06/27/2017 0857   CL 101 06/20/2020 0826   CL 103 06/27/2017 0857   CO2 30 06/20/2020 0826   CO2 26 06/27/2017 0857   BUN 30 (H) 06/20/2020 8588  BUN 34 (H) 03/16/2019 0938   BUN 12 06/27/2017 0857   CREATININE 1.44 (H) 06/20/2020 0826   CREATININE 1.65 (H) 04/18/2020 0956      Component Value Date/Time   CALCIUM 10.7 (H) 06/20/2020 0826   CALCIUM 9.2 06/27/2017 0857   ALKPHOS 54 06/20/2020 0826   ALKPHOS 128 (H) 06/27/2017 0857   AST 19 06/20/2020 0826   ALT 21 06/20/2020 0826   ALT 24 06/27/2017 0857   BILITOT 0.5 06/20/2020 0826       Impression and Plan: Mr. Roehrig is a very pleasant 80yo caucasian gentleman withdiffuse large B-cell lymphoma (not"double hit"lymphoma). He completed treatment in February 2019.He has done well and so far there has been no evidence of recurrence.  He received B 12 today as planned.  Iron studies are pending. We will replace if needed.  Follow-up in 1 month.  He can contact our office with any questions or concerns.   Laverna Peace, NP 1/25/20228:55 AM

## 2020-07-27 ENCOUNTER — Ambulatory Visit (INDEPENDENT_AMBULATORY_CARE_PROVIDER_SITE_OTHER): Payer: Medicare Other | Admitting: Family Medicine

## 2020-07-27 ENCOUNTER — Encounter: Payer: Self-pay | Admitting: Family Medicine

## 2020-07-27 ENCOUNTER — Other Ambulatory Visit: Payer: Self-pay

## 2020-07-27 VITALS — BP 130/70 | HR 75 | Temp 97.9°F | Resp 18 | Ht 73.0 in | Wt 256.8 lb

## 2020-07-27 DIAGNOSIS — E1151 Type 2 diabetes mellitus with diabetic peripheral angiopathy without gangrene: Secondary | ICD-10-CM

## 2020-07-27 DIAGNOSIS — D692 Other nonthrombocytopenic purpura: Secondary | ICD-10-CM | POA: Insufficient documentation

## 2020-07-27 DIAGNOSIS — E1169 Type 2 diabetes mellitus with other specified complication: Secondary | ICD-10-CM | POA: Diagnosis not present

## 2020-07-27 DIAGNOSIS — I25118 Atherosclerotic heart disease of native coronary artery with other forms of angina pectoris: Secondary | ICD-10-CM | POA: Diagnosis not present

## 2020-07-27 DIAGNOSIS — E1165 Type 2 diabetes mellitus with hyperglycemia: Secondary | ICD-10-CM

## 2020-07-27 DIAGNOSIS — I5033 Acute on chronic diastolic (congestive) heart failure: Secondary | ICD-10-CM

## 2020-07-27 DIAGNOSIS — I25810 Atherosclerosis of coronary artery bypass graft(s) without angina pectoris: Secondary | ICD-10-CM | POA: Diagnosis not present

## 2020-07-27 DIAGNOSIS — E1142 Type 2 diabetes mellitus with diabetic polyneuropathy: Secondary | ICD-10-CM

## 2020-07-27 DIAGNOSIS — I10 Essential (primary) hypertension: Secondary | ICD-10-CM

## 2020-07-27 DIAGNOSIS — D61818 Other pancytopenia: Secondary | ICD-10-CM | POA: Diagnosis not present

## 2020-07-27 DIAGNOSIS — IMO0002 Reserved for concepts with insufficient information to code with codable children: Secondary | ICD-10-CM

## 2020-07-27 DIAGNOSIS — C8332 Diffuse large B-cell lymphoma, intrathoracic lymph nodes: Secondary | ICD-10-CM | POA: Diagnosis not present

## 2020-07-27 DIAGNOSIS — E785 Hyperlipidemia, unspecified: Secondary | ICD-10-CM | POA: Diagnosis not present

## 2020-07-27 DIAGNOSIS — I4892 Unspecified atrial flutter: Secondary | ICD-10-CM

## 2020-07-27 DIAGNOSIS — I480 Paroxysmal atrial fibrillation: Secondary | ICD-10-CM

## 2020-07-27 LAB — COMPREHENSIVE METABOLIC PANEL
ALT: 18 U/L (ref 0–53)
AST: 19 U/L (ref 0–37)
Albumin: 4.9 g/dL (ref 3.5–5.2)
Alkaline Phosphatase: 64 U/L (ref 39–117)
BUN: 38 mg/dL — ABNORMAL HIGH (ref 6–23)
CO2: 32 mEq/L (ref 19–32)
Calcium: 10.5 mg/dL (ref 8.4–10.5)
Chloride: 100 mEq/L (ref 96–112)
Creatinine, Ser: 1.91 mg/dL — ABNORMAL HIGH (ref 0.40–1.50)
GFR: 32.35 mL/min — ABNORMAL LOW (ref >60.00)
Glucose, Bld: 135 mg/dL — ABNORMAL HIGH (ref 70–99)
Potassium: 5.1 mEq/L (ref 3.5–5.1)
Sodium: 139 mEq/L (ref 135–145)
Total Bilirubin: 0.6 mg/dL (ref 0.2–1.2)
Total Protein: 7.3 g/dL (ref 6.0–8.3)

## 2020-07-27 LAB — MICROALBUMIN / CREATININE URINE RATIO
Creatinine,U: 27.6 mg/dL
Microalb Creat Ratio: 2.5 mg/g (ref 0.0–30.0)
Microalb, Ur: <0.7 mg/dL (ref 0.0–1.9)

## 2020-07-27 LAB — LDL CHOLESTEROL, DIRECT: Direct LDL: 47 mg/dL

## 2020-07-27 LAB — HEMOGLOBIN A1C: Hgb A1c MFr Bld: 6.7 % — ABNORMAL HIGH (ref 4.6–6.5)

## 2020-07-27 LAB — LIPID PANEL
Cholesterol: 159 mg/dL (ref 0–200)
HDL: 29.6 mg/dL — ABNORMAL LOW (ref >39.00)
Total CHOL/HDL Ratio: 5
Triglycerides: 839 mg/dL — ABNORMAL HIGH (ref 0.0–149.0)

## 2020-07-27 NOTE — Progress Notes (Signed)
Patient ID: Richard [name], male    DOB: July 29, 1940  Age: 80 y.o. MRN: [id number]    Subjective:  Subjective  HPI Richard [name] presents for f/u chol and dm.  No compliants   HYPERTENSION   Blood pressure range-not checking   Chest pain- no      Dyspnea- no Lightheadedness- no   Edema- better   Other side effects - no   Medication compliance: good Low salt diet- yes     DIABETES    Blood Sugar ranges-140s  Polyuria- no New Visual problems- no  Hypoglycemic symptoms- no  Other side effects-no Medication compliance - good Last eye exam- due  Foot exam- today   HYPERLIPIDEMIA  Medication compliance- good RUQ pain- no  Muscle aches- no Other side effects-no     Review of Systems  Constitutional: Negative for appetite change, diaphoresis, fatigue and unexpected weight change.  Eyes: Negative for pain, redness and visual disturbance.  Respiratory: Negative for cough, chest tightness, shortness of breath and wheezing.   Cardiovascular: Negative for chest pain, palpitations and leg swelling.  Endocrine: Negative for cold intolerance, heat intolerance, polydipsia, polyphagia and polyuria.  Genitourinary: Negative for difficulty urinating, dysuria and frequency.  Neurological: Negative for dizziness, light-headedness, numbness and headaches.    History Past Medical History:  Diagnosis Date  . Anemia   . Arthritis    hips  . Axillary adenopathy 02/25/2017  . Bradycardia    a. holter monitor has demonstrated HRs in 30s and [place]   . CAD (coronary artery disease)    a. s/p CABG 2001  b.  07/28/2017 cath:   Severe three-vessel native CAD with total occlusion of LAD, ramus intermedius, first OM and RCA, patent RIMA to PDA, LIMA to LAD, sequential SVG to ramus intermedius and first OM.    [name] Chronic lower back pain   . Diffuse large B cell lymphoma ([place])   . Diverticulosis   . Esophageal stricture   . GERD (gastroesophageal reflux disease)   . History of gout   . HTN  (hypertension)   . Mixed hyperlipidemia   . OSA on CPAP    with 2L O2 at night  . Pancytopenia ([place])    a. related to chemo therapy for B cell lymphoma  . Peptic stricture of esophagus   . Presence of permanent cardiac pacemaker    sees Dr. Valaria Good pacemaker  . Severe aortic stenosis   . Spinal stenosis   . Squamous cell carcinoma of skin 03/22/2009   Right mandible. SCCis, hypertrophic.   . Type II diabetes mellitus ([place])   . Wears dentures    partial upper    He has a past surgical history that includes Vasectomy; Coronary artery bypass graft (03/2000); Esophageal dilation (X 3-4); Esophagogastroduodenoscopy; Flexible sigmoidoscopy; Panendoscopy; Knee arthroscopy (Left, 2011); Shoulder surgery (Right, 08/2010); Colonoscopy w/ biopsies and polypectomy (2013); Upper gi endoscopy (2013); Myelogram (04/06/13); Lumbar laminectomy/decompression microdiscectomy (Right, 06/17/2013); Back surgery; Appendectomy (~ 1952); Tonsillectomy (~ 1954); Coronary angioplasty (1993); Coronary angioplasty with stent (05/1997); Skin cancer excision (Right); left heart catheterization with coronary/graft angiogram (N/A, 03/16/2014); Cataract extraction w/PHACO (Left, 11/06/2016); Cataract extraction w/PHACO (Right, 12/04/2016); Hydradenitis excision (Left, 02/25/2017); Portacath placement (N/A, 03/06/2017); RIGHT/LEFT HEART CATH AND CORONARY/GRAFT ANGIOGRAPHY (N/A, 07/28/2017); Transcatheter aortic valve replacement, transfemoral (N/A, 09/09/2017); Intraoperative transthoracic echocardiogram (N/A, 09/09/2017); PACEMAKER IMPLANT (N/A, 09/10/2017); Orbital lesion excision (Right, 09/01/2018); Ectropion repair (Right, 09/01/2018); and Total knee arthroplasty (Left, 06/29/2019).   His family history includes Diabetes in his maternal grandmother; Heart  attack in his paternal grandmother; Hypertension in his father; Pancreatic cancer in his mother; Stroke in his father and maternal grandmother.He reports that he has never smoked. He  has never used smokeless tobacco. He reports that he does not drink alcohol and does not use drugs.  Current Outpatient Medications on File Prior to Visit  Medication Sig Dispense Refill  . allopurinol (ZYLOPRIM) 300 MG tablet Take 450 mg by mouth daily.    [name] aluminum hydroxide-magnesium carbonate (GAVISCON) 95-358 MG/15ML SUSP Take 15 mLs by mouth as needed for indigestion or heartburn.    [name] amLODipine (NORVASC) 5 MG tablet TAKE 1 TABLET BY MOUTH TWICE A DAY 180 tablet 3  . atorvastatin (LIPITOR) 80 MG tablet TAKE ONE TABLET BY MOUTH AT BEDTIME 90 tablet 3  . azelastine (ASTELIN) 0.1 % nasal spray Place 2 sprays into both nostrils at bedtime as needed for rhinitis or allergies. 30 mL 12  . ELIQUIS 2.5 MG TABS tablet TAKE 1 TABLET BY MOUTH TWICE A DAY 180 tablet 1  . esomeprazole (NEXIUM) 40 MG capsule Take 1 capsule (40 mg total) by mouth daily. 30 capsule 5  . ezetimibe (ZETIA) 10 MG tablet TAKE 1 TABLET BY MOUTH EVERY DAY 90 tablet 3  . fenofibrate 160 MG tablet Take 1 tablet (160 mg total) by mouth daily. 90 tablet 1  . folic acid (FOLVITE) 1 MG tablet TAKE 2 TABLETS BY MOUTH EVERY DAY 180 tablet 4  . FREESTYLE LITE test strip USE TO TEST BLOOD SUGAR ONCE A DAY. DX CODE: E11.9 100 strip 12  . glimepiride (AMARYL) 1 MG tablet TAKE 1 TABLET (1 MG TOTAL) BY MOUTH DAILY WITH BREAKFAST. 90 tablet 1  . hydrocortisone cream 1 % Apply 1 application topically 2 (two) times daily. 45 g 1  . Lancets (FREESTYLE) lancets USE TWICE A DAY TO CHECK BLOOD SUGAR. DX E11.9 100 each 6  . metFORMIN (GLUCOPHAGE) 1000 MG tablet TAKE 1 & 1/2 TABLETS BY MOUTH EVERY MORNING AND 1 TABLET IN THE EVENING. 225 tablet 1  . metoprolol tartrate (LOPRESSOR) 25 MG tablet TAKE ONE-HALF TABLET BY MOUTH TWICE DAILY 90 tablet 3  . nitroGLYCERIN (NITROSTAT) 0.4 MG SL tablet PLACE 1 TABLET (0.4 MG TOTAL) UNDER THE TONGUE EVERY 5 (FIVE) MINUTES AS NEEDED FOR CHEST PAIN. 25 tablet 6  . polycarbophil (FIBERCON) 625 MG tablet Take 625  mg by mouth daily.    . potassium chloride SA (KLOR-CON M20) 20 MEQ tablet Take 2 tablets (40 mEq total) by mouth 2 (two) times daily. 360 tablet 3  . pregabalin (LYRICA) 150 MG capsule TAKE 1 CAPSULE BY MOUTH TWICE A DAY 180 capsule 1  . psyllium (METAMUCIL) 58.6 % powder Take 1 packet by mouth daily as needed (constipation).     . torsemide (DEMADEX) 20 MG tablet Take 40 mg by mouth daily.    [name] VASCEPA 1 g capsule TAKE 2 CAPSULES BY MOUTH TWICE A DAY 120 capsule 5   Current Facility-Administered Medications on File Prior to Visit  Medication Dose Route Frequency Provider Last Rate Last Admin  . sodium chloride flush (NS) 0.9 % injection 10 mL  10 mL Intravenous PRN [name], MD   10 mL at 05/30/17 0920     Objective:  Objective  Physical Exam Vitals and nursing note reviewed.  Constitutional:      General: He is sleeping. Vital signs are normal.     Appearance: He is well-developed and well-nourished.  HENT:     Head:  Normocephalic and atraumatic.     Mouth/Throat:     Mouth: Oropharynx is clear and moist.  Eyes:     Extraocular Movements: EOM normal.     Pupils: Pupils are equal, round, and reactive to light.  Neck:     Thyroid: No thyromegaly.  Cardiovascular:     Rate and Rhythm: Normal rate and regular rhythm.     Heart sounds: No murmur heard.   Pulmonary:     Effort: Pulmonary effort is normal. No respiratory distress.     Breath sounds: Normal breath sounds. No wheezing or rales.  Chest:     Chest wall: No tenderness.  Musculoskeletal:        General: No tenderness or edema.     Cervical back: Normal range of motion and neck supple.  Skin:    General: Skin is warm and dry.  Neurological:     Mental Status: He is oriented to person, place, and time.  Psychiatric:        Mood and Affect: Mood and affect normal.        Behavior: Behavior normal.        Thought Content: Thought content normal.        Judgment: Judgment normal.    Diabetic Foot Exam -  Simple   Simple Foot Form Diabetic Foot exam was performed with the following findings: Yes 07/27/2020  8:50 AM  Visual Inspection No deformities, no ulcerations, no other skin breakdown bilaterally: Yes Sensation Testing Intact to touch and monofilament testing bilaterally: Yes Pulse Check Posterior Tibialis and Dorsalis pulse intact bilaterally: Yes Comments     BP 130/70 (BP Location: Right Arm, Patient Position: Sitting, Cuff Size: Large)   Pulse 75   Temp 97.9 F (36.6 C) (Oral)   Resp 18   Ht 6\' 1"  (1.854 m)   Wt 256 lb 12.8 oz (116.5 kg)   SpO2 96%   BMI 33.88 kg/m  Wt Readings from Last 3 Encounters:  07/27/20 256 lb 12.8 oz (116.5 kg)  07/25/20 258 lb (117 kg)  06/20/20 257 lb (116.6 kg)     Lab Results  Component Value Date   WBC 6.8 07/25/2020   HGB 12.3 (L) 07/25/2020   HCT 38.1 (L) 07/25/2020   PLT 202 07/25/2020   GLUCOSE 191 (H) 07/25/2020   CHOL 177 04/18/2020   TRIG 745 (H) 04/18/2020   HDL 30 (L) 04/18/2020   LDLDIRECT 48.0 11/22/2019   [place]  04/18/2020     Comment:     . LDL cholesterol not calculated. Triglyceride levels greater than 400 mg/dL invalidate calculated LDL results. . Reference range: <100 . Desirable range <100 mg/dL for primary prevention;   <70 mg/dL for patients with CHD or diabetic patients  with > or = 2 CHD risk factors. [name] LDL-C is now calculated using the Martin-Hopkins  calculation, which is a validated novel method providing  better accuracy than the Friedewald equation in the  estimation of LDL-C.  [name] et al. [name]. 7680;881(10): 2061-2068  (http://education.[employer].com/faq/FAQ164)    ALT 19 07/25/2020   AST 21 07/25/2020   NA 137 07/25/2020   K 4.2 07/25/2020   CL 100 07/25/2020   CREATININE 2.03 (H) 07/25/2020   BUN 38 (H) 07/25/2020   CO2 25 07/25/2020   TSH 2.450 07/27/2018   PSA 1.11 11/22/2019   INR 1.15 11/15/2017   HGBA1C 7.0 (H) 04/18/2020   MICROALBUR <0.2 04/18/2020    US  RENAL  Result Date: 05/11/2020 CLINICAL  DATA:  Chronic kidney disease. EXAM: RENAL / URINARY TRACT ULTRASOUND COMPLETE COMPARISON:  CT abdomen and pelvis 08/16/2017 FINDINGS: Right Kidney: Renal measurements: 11.0 x 5.1 x 5.7 cm = volume: 166 mL. Echogenicity within normal limits. No mass or hydronephrosis visualized. Left Kidney: Renal measurements: 10.5 x 6.1 x 5.4 cm = volume: 178 mL. Echogenicity within normal limits. No solid mass or hydronephrosis visualized. 14 mm interpolar cyst. Bladder: Appears normal for degree of bladder distention. Other: Diffusely increased parenchymal echogenicity of the visualized portion of the liver. IMPRESSION: 1. 14 mm left renal cyst, otherwise unremarkable appearance of the kidneys. 2. Suspected hepatic steatosis. Electronically Signed   By: Logan Bores M.D.   On: 05/11/2020 17:00     Assessment & Plan:  Plan  I am having Staton L. Rewerts maintain his allopurinol, esomeprazole, polycarbophil, psyllium, aluminum hydroxide-magnesium carbonate, freestyle, Eliquis, atorvastatin, amLODipine, nitroGLYCERIN, metoprolol tartrate, ezetimibe, pregabalin, hydrocortisone cream, glimepiride, potassium chloride SA, Vascepa, folic acid, FREESTYLE LITE, metFORMIN, torsemide, fenofibrate, and azelastine.  No orders of the defined types were placed in this encounter.   Problem List Items Addressed This Visit      Unprioritized   Acute on chronic diastolic CHF (congestive heart failure), NYHA class 2 (HCC)   CAD, ARTERY BYPASS GRAFT    con't meds con't with cardiology f/u      Coronary artery disease   Diabetic peripheral neuropathy associated with type 2 diabetes mellitus (HCC)    con'tlyrica      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 ([place])    Per oncology      DM (diabetes mellitus) type II uncontrolled, periph vascular disorder ([place])   Hyperlipidemia associated with type 2 diabetes mellitus ([place])    Tolerating statin, encouraged heart  healthy diet, avoid trans fats, minimize simple carbs and saturated fats. Increase exercise as tolerated      Relevant Orders   Lipid panel   Comprehensive metabolic panel   Pancytopenia (HCC)   Paroxysmal atrial fibrillation [facility])    Per cardiology      Senile purpura ([place])   Uncontrolled type 2 diabetes mellitus with hyperglycemia ([place])    hgba1c to be checked, minimize simple carbs. Increase exercise as tolerated. Continue current meds       Relevant Orders   Comprehensive metabolic panel   Hemoglobin A1c   Microalbumin / creatinine urine ratio    Other Visit Diagnoses    Primary hypertension    -  Primary   Relevant Orders   Lipid panel   Comprehensive metabolic panel   Hemoglobin A1c   Microalbumin / creatinine urine ratio   Atrial flutter, unspecified type (HCC)   (Chronic)        Follow-up: Return in about 6 months (around 01/24/2021), or if symptoms worsen or fail to improve, for hypertension, hyperlipidemia, diabetes II,, awv with rn.  [name], DO

## 2020-07-27 NOTE — Patient Instructions (Signed)
https://www.diabeteseducator.org/docs/default-source/living-with-diabetes/conquering-the-grocery-store-v1.pdf?sfvrsn=4">  Carbohydrate Counting for Diabetes Mellitus, Adult Carbohydrate counting is a method of keeping track of how many carbohydrates you eat. Eating carbohydrates naturally increases the amount of sugar (glucose) in the blood. Counting how many carbohydrates you eat improves your blood glucose control, which helps you manage your diabetes. It is important to know how many carbohydrates you can safely have in each meal. This is different for every person. A dietitian can help you make a meal plan and calculate how many carbohydrates you should have at each meal and snack. What foods contain carbohydrates? Carbohydrates are found in the following foods:  Grains, such as breads and cereals.  Dried beans and soy products.  Starchy vegetables, such as potatoes, peas, and corn.  Fruit and fruit juices.  Milk and yogurt.  Sweets and snack foods, such as cake, cookies, candy, chips, and soft drinks.   How do I count carbohydrates in foods? There are two ways to count carbohydrates in food. You can read food labels or learn standard serving sizes of foods. You can use either of the methods or a combination of both. Using the Nutrition Facts label The Nutrition Facts list is included on the labels of almost all packaged foods and beverages in the [place] It includes:  The serving size.  Information about nutrients in each serving, including the grams (g) of carbohydrate per serving. To use the Nutrition Facts:  Decide how many servings you will have.  Multiply the number of servings by the number of carbohydrates per serving.  The resulting number is the total amount of carbohydrates that you will be having. Learning the standard serving sizes of foods When you eat carbohydrate foods that are not packaged or do not include Nutrition Facts on the label, you need to measure the  servings in order to count the amount of carbohydrates.  Measure the foods that you will eat with a food scale or measuring cup, if needed.  Decide how many standard-size servings you will eat.  Multiply the number of servings by 15. For foods that contain carbohydrates, one serving equals 15 g of carbohydrates. ? For example, if you eat 2 cups or 10 oz (300 g) of strawberries, you will have eaten 2 servings and 30 g of carbohydrates (2 servings x 15 g = 30 g).  For foods that have more than one food mixed, such as soups and casseroles, you must count the carbohydrates in each food that is included. The following list contains standard serving sizes of common carbohydrate-rich foods. Each of these servings has about 15 g of carbohydrates:  1 slice of bread.  1 six-inch (15 cm) tortilla.  ? cup or 2 oz (53 g) cooked rice or pasta.   cup or 3 oz (85 g) cooked or canned, drained and rinsed beans or lentils.   cup or 3 oz (85 g) starchy vegetable, such as peas, corn, or squash.   cup or 4 oz (120 g) hot cereal.   cup or 3 oz (85 g) boiled or mashed potatoes, or  or 3 oz (85 g) of a large baked potato.   cup or 4 fl oz (118 mL) fruit juice.  1 cup or 8 fl oz (237 mL) milk.  1 small or 4 oz (106 g) apple.   or 2 oz (63 g) of a medium banana.  1 cup or 5 oz (150 g) strawberries.  3 cups or 1 oz (24 g) popped popcorn. What is an example of   carbohydrate counting? To calculate the number of carbohydrates in this sample meal, follow the steps shown below. Sample meal  3 oz (85 g) chicken breast.  ? cup or 4 oz (106 g) brown rice.   cup or 3 oz (85 g) corn.  1 cup or 8 fl oz (237 mL) milk.  1 cup or 5 oz (150 g) strawberries with sugar-free whipped topping. Carbohydrate calculation 1. Identify the foods that contain carbohydrates: ? Rice. ? Corn. ? Milk. ? Strawberries. 2. Calculate how many servings you have of each food: ? 2 servings rice. ? 1 serving  corn. ? 1 serving milk. ? 1 serving strawberries. 3. Multiply each number of servings by 15 g: ? 2 servings rice x 15 g = 30 g. ? 1 serving corn x 15 g = 15 g. ? 1 serving milk x 15 g = 15 g. ? 1 serving strawberries x 15 g = 15 g. 4. Add together all of the amounts to find the total grams of carbohydrates eaten: ? 30 g + 15 g + 15 g + 15 g = 75 g of carbohydrates total. What are tips for following this plan? Shopping  Develop a meal plan and then make a shopping list.  Buy fresh and frozen vegetables, fresh and frozen fruit, dairy, eggs, beans, lentils, and whole grains.  Look at food labels. Choose foods that have more fiber and less sugar.  Avoid processed foods and foods with added sugars. Meal planning  Aim to have the same amount of carbohydrates at each meal and for each snack time.  Plan to have regular, balanced meals and snacks. Where to find more information  American Diabetes Association: www.diabetes.org  Centers for Disease Control and Prevention: www.cdc.gov Summary  Carbohydrate counting is a method of keeping track of how many carbohydrates you eat.  Eating carbohydrates naturally increases the amount of sugar (glucose) in the blood.  Counting how many carbohydrates you eat improves your blood glucose control, which helps you manage your diabetes.  A dietitian can help you make a meal plan and calculate how many carbohydrates you should have at each meal and snack. This information is not intended to replace advice given to you by your health care provider. Make sure you discuss any questions you have with your health care provider. Document Revised: 06/17/2019 Document Reviewed: 06/18/2019 Elsevier Patient Education  2021 Elsevier Inc.  

## 2020-07-27 NOTE — Assessment & Plan Note (Signed)
Per oncology °

## 2020-07-27 NOTE — Assessment & Plan Note (Signed)
Tolerating statin, encouraged heart healthy diet, avoid trans fats, minimize simple carbs and saturated fats. Increase exercise as tolerated 

## 2020-07-27 NOTE — Assessment & Plan Note (Signed)
con'tlyrica

## 2020-07-27 NOTE — Assessment & Plan Note (Signed)
con't meds con't with cardiology f/u

## 2020-07-27 NOTE — Assessment & Plan Note (Signed)
Per cardiology 

## 2020-07-27 NOTE — Assessment & Plan Note (Signed)
hgba1c to be checked, minimize simple carbs. Increase exercise as tolerated. Continue current meds  

## 2020-07-28 DIAGNOSIS — M5459 Other low back pain: Secondary | ICD-10-CM | POA: Diagnosis not present

## 2020-07-28 DIAGNOSIS — M545 Low back pain, unspecified: Secondary | ICD-10-CM | POA: Diagnosis not present

## 2020-08-01 ENCOUNTER — Other Ambulatory Visit: Payer: Self-pay

## 2020-08-01 ENCOUNTER — Telehealth: Payer: Self-pay | Admitting: Family Medicine

## 2020-08-01 ENCOUNTER — Other Ambulatory Visit: Payer: Self-pay | Admitting: Family Medicine

## 2020-08-01 ENCOUNTER — Ambulatory Visit (INDEPENDENT_AMBULATORY_CARE_PROVIDER_SITE_OTHER): Payer: Medicare Other

## 2020-08-01 DIAGNOSIS — E781 Pure hyperglyceridemia: Secondary | ICD-10-CM

## 2020-08-01 DIAGNOSIS — L570 Actinic keratosis: Secondary | ICD-10-CM

## 2020-08-01 DIAGNOSIS — E11649 Type 2 diabetes mellitus with hypoglycemia without coma: Secondary | ICD-10-CM

## 2020-08-01 MED ORDER — AMINOLEVULINIC ACID HCL 20 % EX SOLR
2.0000 | Freq: Once | CUTANEOUS | Status: AC
Start: 2020-08-01 — End: 2020-08-01
  Administered 2020-08-01: 708 mg via TOPICAL

## 2020-08-01 NOTE — Telephone Encounter (Signed)
Patient states he does not understand message under lab results. He would like a call back with explanation

## 2020-08-01 NOTE — Progress Notes (Signed)
1. AK (actinic keratosis) bilateral forearms  Photodynamic therapy - bilateral forearms Procedure discussed: discussed risks, benefits, side effects. and alternatives   Prep: site scrubbed/prepped with acetone   Location:  Bilateral forearms Number of lesions:  Multiple Type of treatment:  Blue light Aminolevulinic Acid (see MAR for details): Levulan Number of Levulan sticks used:  2 Incubation time (minutes):  120 Number of minutes under lamp:  16 Number of seconds under lamp:  40 Cooling:  Floor fan Outcome: patient tolerated procedure well with no complications   Post-procedure details: sunscreen applied and aftercare instructions given to patient    Aminolevulinic Acid HCl 20 % SOLR 708 mg - bilateral forearms

## 2020-08-01 NOTE — Patient Instructions (Signed)
Levulan/PDT Treatment Common Side Effects  - Burning/stinging, which may be severe and last up to 24-72 hours after your treatment  - Redness, swelling and/or peeling which may last up to 4 weeks  - Scaling/crusting which may last up to 2 weeks  - Sun sensitivity (you MUST avoid sun exposure for 48-72 hours after treatment)  Care Instructions  - Okay to wash with soap and water and shampoo as normal  - If needed, you can do a cold compress (ex. Ice packs) for comfort  - If okay with your Primary Doctor, you may use analgesics such as Tylenol every 4-6 hours, not to exceed recommended dose  - You may apply Cerave Healing Ointment, Vaseline or Aquaphor  - If you have a lot of swelling you may take a Benadryl to help with this (this may cause drowsiness)  Sun Precautions  - Wear a wide brim hat for the next week if outside  - Wear a sunblock with zinc or titanium dioxide at least SPF 50 daily   We will recheck you in 10-12 weeks. If any problems, please call the office and ask to speak with a nurse. 

## 2020-08-01 NOTE — Telephone Encounter (Signed)
Pt called. Pt advised of labs and next steps

## 2020-08-04 ENCOUNTER — Ambulatory Visit (INDEPENDENT_AMBULATORY_CARE_PROVIDER_SITE_OTHER): Payer: Medicare Other

## 2020-08-04 DIAGNOSIS — I442 Atrioventricular block, complete: Secondary | ICD-10-CM

## 2020-08-04 LAB — CUP PACEART REMOTE DEVICE CHECK
Battery Remaining Longevity: 30 mo
Battery Voltage: 2.93 V
Brady Statistic AP VP Percent: 16.88 %
Brady Statistic AP VS Percent: 0 %
Brady Statistic AS VP Percent: 82.96 %
Brady Statistic AS VS Percent: 0.16 %
Brady Statistic RA Percent Paced: 16.9 %
Brady Statistic RV Percent Paced: 99.84 %
Date Time Interrogation Session: 20220203214115
Implantable Lead Implant Date: 20190313
Implantable Lead Implant Date: 20190313
Implantable Lead Location: 753859
Implantable Lead Location: 753860
Implantable Lead Model: 3830
Implantable Lead Model: 5076
Implantable Pulse Generator Implant Date: 20190313
Lead Channel Impedance Value: 323 Ohm
Lead Channel Impedance Value: 342 Ohm
Lead Channel Impedance Value: 380 Ohm
Lead Channel Impedance Value: 418 Ohm
Lead Channel Pacing Threshold Amplitude: 0.875 V
Lead Channel Pacing Threshold Amplitude: 1 V
Lead Channel Pacing Threshold Pulse Width: 0.4 ms
Lead Channel Pacing Threshold Pulse Width: 0.4 ms
Lead Channel Sensing Intrinsic Amplitude: 1.75 mV
Lead Channel Sensing Intrinsic Amplitude: 1.75 mV
Lead Channel Sensing Intrinsic Amplitude: 1.75 mV
Lead Channel Sensing Intrinsic Amplitude: 1.75 mV
Lead Channel Setting Pacing Amplitude: 1.75 V
Lead Channel Setting Pacing Amplitude: 3.5 V
Lead Channel Setting Pacing Pulse Width: 1 ms
Lead Channel Setting Sensing Sensitivity: 1.2 mV

## 2020-08-07 ENCOUNTER — Other Ambulatory Visit: Payer: Self-pay | Admitting: Family Medicine

## 2020-08-07 DIAGNOSIS — G609 Hereditary and idiopathic neuropathy, unspecified: Secondary | ICD-10-CM

## 2020-08-07 NOTE — Telephone Encounter (Signed)
Requesting: Lyrica 150mg  Contract: None UDS: None Last Visit: 07/27/2020 Next Visit: None Last Refill: 02/02/2020 #180 and 1RF  Please Advise

## 2020-08-08 NOTE — Progress Notes (Signed)
Remote pacemaker transmission.   

## 2020-08-09 ENCOUNTER — Encounter: Payer: Self-pay | Admitting: Hematology & Oncology

## 2020-08-09 ENCOUNTER — Telehealth: Payer: Self-pay | Admitting: Family Medicine

## 2020-08-09 NOTE — Telephone Encounter (Signed)
Are they asking [place] to do the labs in our office?   Do tricia and charisma know how to draw from picc line?

## 2020-08-09 NOTE — Telephone Encounter (Signed)
Caller : [facility]  Call Back @ [phone] ext [phone]  Patient has an appointment with [facility] on February 14,2022 and his doctor (dr singh)  would like more labs drawn, dr singh  would like them drawn here b/c of his picc line, Please advise what Dr Candiss Norse offices need to do have that happen.

## 2020-08-10 NOTE — Telephone Encounter (Signed)
We do not access PICC lines or Ports in our lab here.

## 2020-08-10 NOTE — Telephone Encounter (Signed)
Spoke with [facility]. Advised we do not draw labs from PICC lines/ports

## 2020-08-10 NOTE — Telephone Encounter (Signed)
I did not think so--- heather would you let [facility] know that

## 2020-08-15 DIAGNOSIS — N1832 Chronic kidney disease, stage 3b: Secondary | ICD-10-CM | POA: Diagnosis not present

## 2020-08-15 DIAGNOSIS — N2581 Secondary hyperparathyroidism of renal origin: Secondary | ICD-10-CM | POA: Diagnosis not present

## 2020-08-15 DIAGNOSIS — R609 Edema, unspecified: Secondary | ICD-10-CM | POA: Diagnosis not present

## 2020-08-15 DIAGNOSIS — I129 Hypertensive chronic kidney disease with stage 1 through stage 4 chronic kidney disease, or unspecified chronic kidney disease: Secondary | ICD-10-CM | POA: Diagnosis not present

## 2020-08-15 DIAGNOSIS — D649 Anemia, unspecified: Secondary | ICD-10-CM | POA: Diagnosis not present

## 2020-08-17 DIAGNOSIS — M7061 Trochanteric bursitis, right hip: Secondary | ICD-10-CM | POA: Diagnosis not present

## 2020-08-17 DIAGNOSIS — M7062 Trochanteric bursitis, left hip: Secondary | ICD-10-CM | POA: Diagnosis not present

## 2020-08-22 ENCOUNTER — Inpatient Hospital Stay: Payer: Medicare Other

## 2020-08-22 ENCOUNTER — Telehealth: Payer: Self-pay

## 2020-08-22 ENCOUNTER — Inpatient Hospital Stay: Payer: Medicare Other | Attending: Hematology & Oncology

## 2020-08-22 ENCOUNTER — Inpatient Hospital Stay (HOSPITAL_BASED_OUTPATIENT_CLINIC_OR_DEPARTMENT_OTHER): Payer: Medicare Other | Admitting: Family

## 2020-08-22 ENCOUNTER — Other Ambulatory Visit: Payer: Self-pay

## 2020-08-22 ENCOUNTER — Encounter: Payer: Self-pay | Admitting: Family

## 2020-08-22 VITALS — BP 122/64 | HR 69 | Temp 98.8°F | Resp 17 | Ht 73.0 in | Wt 253.0 lb

## 2020-08-22 DIAGNOSIS — C8338 Diffuse large B-cell lymphoma, lymph nodes of multiple sites: Secondary | ICD-10-CM | POA: Diagnosis not present

## 2020-08-22 DIAGNOSIS — D500 Iron deficiency anemia secondary to blood loss (chronic): Secondary | ICD-10-CM | POA: Insufficient documentation

## 2020-08-22 DIAGNOSIS — I25810 Atherosclerosis of coronary artery bypass graft(s) without angina pectoris: Secondary | ICD-10-CM | POA: Diagnosis not present

## 2020-08-22 DIAGNOSIS — C8332 Diffuse large B-cell lymphoma, intrathoracic lymph nodes: Secondary | ICD-10-CM

## 2020-08-22 DIAGNOSIS — Z886 Allergy status to analgesic agent status: Secondary | ICD-10-CM | POA: Insufficient documentation

## 2020-08-22 DIAGNOSIS — D508 Other iron deficiency anemias: Secondary | ICD-10-CM

## 2020-08-22 DIAGNOSIS — D519 Vitamin B12 deficiency anemia, unspecified: Secondary | ICD-10-CM

## 2020-08-22 DIAGNOSIS — G620 Drug-induced polyneuropathy: Secondary | ICD-10-CM | POA: Insufficient documentation

## 2020-08-22 DIAGNOSIS — D510 Vitamin B12 deficiency anemia due to intrinsic factor deficiency: Secondary | ICD-10-CM | POA: Diagnosis not present

## 2020-08-22 LAB — CBC WITH DIFFERENTIAL (CANCER CENTER ONLY)
Abs Immature Granulocytes: 0.15 10*3/uL — ABNORMAL HIGH (ref 0.00–0.07)
Basophils Absolute: 0 10*3/uL (ref 0.0–0.1)
Basophils Relative: 0 %
Eosinophils Absolute: 0 10*3/uL (ref 0.0–0.5)
Eosinophils Relative: 1 %
HCT: 39.5 % (ref 39.0–52.0)
Hemoglobin: 12.8 g/dL — ABNORMAL LOW (ref 13.0–17.0)
Immature Granulocytes: 2 %
Lymphocytes Relative: 21 %
Lymphs Abs: 1.7 10*3/uL (ref 0.7–4.0)
MCH: 28.6 pg (ref 26.0–34.0)
MCHC: 32.4 g/dL (ref 30.0–36.0)
MCV: 88.4 fL (ref 80.0–100.0)
Monocytes Absolute: 0.6 10*3/uL (ref 0.1–1.0)
Monocytes Relative: 7 %
Neutro Abs: 5.6 10*3/uL (ref 1.7–7.7)
Neutrophils Relative %: 69 %
Platelet Count: 236 10*3/uL (ref 150–400)
RBC: 4.47 MIL/uL (ref 4.22–5.81)
RDW: 16.3 % — ABNORMAL HIGH (ref 11.5–15.5)
WBC Count: 8.1 10*3/uL (ref 4.0–10.5)
nRBC: 0 % (ref 0.0–0.2)

## 2020-08-22 LAB — CMP (CANCER CENTER ONLY)
ALT: 20 U/L (ref 0–44)
AST: 16 U/L (ref 15–41)
Albumin: 4.7 g/dL (ref 3.5–5.0)
Alkaline Phosphatase: 55 U/L (ref 38–126)
Anion gap: 10 (ref 5–15)
BUN: 46 mg/dL — ABNORMAL HIGH (ref 8–23)
CO2: 26 mmol/L (ref 22–32)
Calcium: 10.6 mg/dL — ABNORMAL HIGH (ref 8.9–10.3)
Chloride: 103 mmol/L (ref 98–111)
Creatinine: 1.53 mg/dL — ABNORMAL HIGH (ref 0.61–1.24)
GFR, Estimated: 45 mL/min — ABNORMAL LOW (ref >60)
Glucose, Bld: 99 mg/dL (ref 70–99)
Potassium: 4.4 mmol/L (ref 3.5–5.1)
Sodium: 139 mmol/L (ref 135–145)
Total Bilirubin: 0.5 mg/dL (ref 0.3–1.2)
Total Protein: 6.9 g/dL (ref 6.5–8.1)

## 2020-08-22 LAB — IRON AND TIBC
Iron: 92 ug/dL (ref 42–163)
Saturation Ratios: 23 % (ref 20–55)
TIBC: 395 ug/dL (ref 202–409)
UIBC: 303 ug/dL (ref 117–376)

## 2020-08-22 LAB — RETICULOCYTES
Immature Retic Fract: 26.3 % — ABNORMAL HIGH (ref 2.3–15.9)
RBC.: 4.42 MIL/uL (ref 4.22–5.81)
Retic Count, Absolute: 92.4 10*3/uL (ref 19.0–186.0)
Retic Ct Pct: 2.1 % (ref 0.4–3.1)

## 2020-08-22 LAB — FERRITIN: Ferritin: 898 ng/mL — ABNORMAL HIGH (ref 24–336)

## 2020-08-22 LAB — VITAMIN B12: Vitamin B-12: 125 pg/mL — ABNORMAL LOW (ref 180–914)

## 2020-08-22 MED ORDER — CYANOCOBALAMIN 1000 MCG/ML IJ SOLN
INTRAMUSCULAR | Status: AC
  Filled 2020-08-22: qty 1

## 2020-08-22 MED ORDER — CYANOCOBALAMIN 1000 MCG/ML IJ SOLN
1000.0000 ug | Freq: Once | INTRAMUSCULAR | Status: AC
  Administered 2020-08-22: 1000 ug via INTRAMUSCULAR

## 2020-08-22 NOTE — Patient Instructions (Signed)

## 2020-08-22 NOTE — Progress Notes (Signed)
Hematology and Oncology Follow Up Visit  Richard [name] [id number] 05-05-1941 80 y.o. 08/22/2020   Principle Diagnosis:  Diffuse large cell non-Hodgkin's lymphoma (IPI = 3) - NOT "double hit" Pernicious anemia Iron deficiency secondary to bleeding  Past Therapy: R-CHOP - s/p cycle8 - completed 08/2017  Current Therapy: Vitamin B12 1 mg IM every month Xgeva 120 mg subcu q 3 months - next doseduein03/2022 IV ironas indicated   Interim History:  Richard [name] is here today for follow-up and B 12 injection. He is ding well and continues to play golf several days a week.  B 12 level at last appointment with 146.  He has some arthritic pain in knees that comes and goes.  No swelling in his extremities at this time. He states that he is doing well on Demadex.  No fever, chills, n/v, cough, rash, dizziness, SOB, chest pain, palpitations, abdominal pain or changes in bowel or bladder habits.  He has maintained a good appetite and is staying well hydrated. He states that his cholesterol has stayed consistently elevated and that he has been referred to a lipid clinic for further evaluation and treatment.  No falls or syncope to report.  No episodes of blood loss. No petechiae.   ECOG Performance Status: 1 - Symptomatic but completely ambulatory  Medications:  Allergies as of 08/22/2020      Reactions   Benazepril Swelling   angioedema; he is not a candidate for any angiotensin receptor blockers because of this significant allergic reaction. Because of a history of documented adverse serious drug reaction;Medi Alert bracelet  is recommended   Hctz [hydrochlorothiazide] Anaphylaxis, Swelling   Tongue and lip swelling   Aspirin Other (See Comments)   Gastritis, cant take 325 Mg aspirin    Lactose Intolerance (gi) Nausea And Vomiting      Medication List       Accurate as of August 22, 2020  8:52 AM. If you have any questions, ask your nurse or doctor.         allopurinol 300 MG tablet Commonly known as: ZYLOPRIM Take 450 mg by mouth daily.   aluminum hydroxide-magnesium carbonate 95-358 MG/15ML Susp Commonly known as: GAVISCON Take 15 mLs by mouth as needed for indigestion or heartburn.   amLODipine 5 MG tablet Commonly known as: NORVASC TAKE 1 TABLET BY MOUTH TWICE A DAY   atorvastatin 80 MG tablet Commonly known as: LIPITOR TAKE ONE TABLET BY MOUTH AT BEDTIME   azelastine 0.1 % nasal spray Commonly known as: ASTELIN Place 2 sprays into both nostrils at bedtime as needed for rhinitis or allergies.   Eliquis 2.5 MG Tabs tablet Generic drug: apixaban TAKE 1 TABLET BY MOUTH TWICE A DAY   esomeprazole 40 MG capsule Commonly known as: NexIUM Take 1 capsule (40 mg total) by mouth daily.   ezetimibe 10 MG tablet Commonly known as: ZETIA TAKE 1 TABLET BY MOUTH EVERY DAY   fenofibrate 160 MG tablet Take 1 tablet (160 mg total) by mouth daily.   folic acid 1 MG tablet Commonly known as: FOLVITE TAKE 2 TABLETS BY MOUTH EVERY DAY   freestyle lancets USE TWICE A DAY TO CHECK BLOOD SUGAR. DX E11.9   FREESTYLE LITE test strip Generic drug: glucose blood USE TO TEST BLOOD SUGAR ONCE A DAY. DX CODE: E11.9   glimepiride 1 MG tablet Commonly known as: AMARYL TAKE 1 TABLET (1 MG TOTAL) BY MOUTH DAILY WITH BREAKFAST.   hydrocortisone cream 1 % Apply 1 application topically 2 (  two) times daily.   metFORMIN 1000 MG tablet Commonly known as: GLUCOPHAGE TAKE 1 & 1/2 TABLETS BY MOUTH EVERY MORNING AND 1 TABLET IN THE EVENING.   metoprolol tartrate 25 MG tablet Commonly known as: LOPRESSOR TAKE ONE-HALF TABLET BY MOUTH TWICE DAILY   nitroGLYCERIN 0.4 MG SL tablet Commonly known as: NITROSTAT PLACE 1 TABLET (0.4 MG TOTAL) UNDER THE TONGUE EVERY 5 (FIVE) MINUTES AS NEEDED FOR CHEST PAIN.   polycarbophil 625 MG tablet Commonly known as: FIBERCON Take 625 mg by mouth daily.   potassium chloride SA 20 MEQ tablet Commonly known as:  Klor-Con M20 Take 2 tablets (40 mEq total) by mouth 2 (two) times daily.   pregabalin 150 MG capsule Commonly known as: LYRICA TAKE 1 CAPSULE BY MOUTH TWICE A DAY   psyllium 58.6 % powder Commonly known as: METAMUCIL Take 1 packet by mouth daily as needed (constipation).   torsemide 20 MG tablet Commonly known as: DEMADEX Take 40 mg by mouth daily.   Vascepa 1 g capsule Generic drug: icosapent Ethyl TAKE 2 CAPSULES BY MOUTH TWICE A DAY       Allergies:  Allergies  Allergen Reactions  . Benazepril Swelling    angioedema; he is not a candidate for any angiotensin receptor blockers because of this significant allergic reaction. Because of a history of documented adverse serious drug reaction;Medi Alert bracelet  is recommended  . Hctz [Hydrochlorothiazide] Anaphylaxis and Swelling    Tongue and lip swelling   . Aspirin Other (See Comments)    Gastritis, cant take 325 Mg aspirin   . Lactose Intolerance (Gi) Nausea And Vomiting    Past Medical History, Surgical history, Social history, and Family History were reviewed and updated.  Review of Systems: All other 10 point review of systems is negative.   Physical Exam:  height is 6\' 1"  (1.854 m) and weight is 253 lb (114.8 kg). His oral temperature is 98.8 F (37.1 C). His blood pressure is 122/64 and his pulse is 69. His respiration is 17 and oxygen saturation is 100%.   Wt Readings from Last 3 Encounters:  08/22/20 253 lb (114.8 kg)  07/27/20 256 lb 12.8 oz (116.5 kg)  07/25/20 258 lb (117 kg)    Ocular: Sclerae unicteric, pupils equal, round and reactive to light Ear-nose-throat: Oropharynx clear, dentition fair Lymphatic: No cervical or supraclavicular adenopathy Lungs no rales or rhonchi, good excursion bilaterally Heart regular rate and rhythm, no murmur appreciated Abd soft, nontender, positive bowel sounds MSK no focal spinal tenderness, no joint edema Neuro: non-focal, well-oriented, appropriate  affect Breasts: Deferred   Lab Results  Component Value Date   WBC 8.1 08/22/2020   HGB 12.8 (L) 08/22/2020   HCT 39.5 08/22/2020   MCV 88.4 08/22/2020   PLT 236 08/22/2020   Lab Results  Component Value Date   FERRITIN 1,227 (H) 07/25/2020   IRON 77 07/25/2020   TIBC 370 07/25/2020   UIBC 293 07/25/2020   IRONPCTSAT 21 07/25/2020   Lab Results  Component Value Date   RETICCTPCT 2.1 08/22/2020   RBC 4.42 08/22/2020   RBC 4.47 08/22/2020   RETICCTABS 52.1 11/22/2011   No results found for: Nils Pyle [facility] Lab Results  Component Value Date   IGA 159 03/09/2012   Lab Results  Component Value Date   ALBUMINELP 4.2 07/27/2018   MSPIKE Not Observed 07/27/2018     Chemistry      Component Value Date/Time   NA 139 07/27/2020 0858   NA  136 03/16/2019 0938   NA 144 06/27/2017 0857   K 5.1 07/27/2020 0858   K 3.9 06/27/2017 0857   CL 100 07/27/2020 0858   CL 103 06/27/2017 0857   CO2 32 07/27/2020 0858   CO2 26 06/27/2017 0857   BUN 38 (H) 07/27/2020 0858   BUN 34 (H) 03/16/2019 0938   BUN 12 06/27/2017 0857   CREATININE 1.91 (H) 07/27/2020 0858   CREATININE 2.03 (H) 07/25/2020 0859   CREATININE 1.65 (H) 04/18/2020 0956      Component Value Date/Time   CALCIUM 10.5 07/27/2020 0858   CALCIUM 9.2 06/27/2017 0857   ALKPHOS 64 07/27/2020 0858   ALKPHOS 128 (H) 06/27/2017 0857   AST 19 07/27/2020 0858   AST 21 07/25/2020 0859   ALT 18 07/27/2020 0858   ALT 19 07/25/2020 0859   ALT 24 06/27/2017 0857   BILITOT 0.6 07/27/2020 0858   BILITOT 0.4 07/25/2020 0859       Impression and Plan: Richard [name] is a very pleasant 80yo caucasian gentleman withdiffuse large B-cell lymphoma (not"double hit"lymphoma). He completed treatment in February 2019.He has done well and so far there has been no evidence of recurrence. He has no complaints at this time.  He received B 12 injection today.  Iron studies are pending. We will replace if needed.   Follow-up in 1 month.  He can contact our office with any questions or concerns.   Laverna Peace, NP 2/22/20228:52 AM

## 2020-08-22 NOTE — Telephone Encounter (Signed)
appts made and printed for pt per 08/22/20 los    Richard [name] 

## 2020-08-22 NOTE — Patient Instructions (Signed)

## 2020-09-07 NOTE — Progress Notes (Signed)
Chronic Care Management Pharmacy Note  09/13/2020 Name:  Richard [name] MRN:  [id number] DOB:  30-Jul-1940  Subjective: Richard [name] is an 80 y.o. year old male who is a primary patient of [name], DO.  The CCM team was consulted for assistance with disease management and care coordination needs.    Engaged with patient by telephone for follow up visit in response to provider referral for pharmacy case management and/or care coordination services.   Consent to Services:  The patient was given the following information about Chronic Care Management services today, agreed to services, and gave verbal consent: 1. CCM service includes personalized support from designated clinical staff supervised by the primary care provider, including individualized plan of care and coordination with other care providers 2. 24/7 contact phone numbers for assistance for urgent and routine care needs. 3. Service will only be billed when office clinical staff spend 20 minutes or more in a month to coordinate care. 4. Only one practitioner may furnish and bill the service in a calendar month. 5.The patient may stop CCM services at any time (effective at the end of the month) by phone call to the office staff. 6. The patient will be responsible for cost sharing (co-pay) of up to 20% of the service fee (after annual deductible is met). Patient agreed to services and consent obtained.  Patient Care Team: Carollee Herter, Alferd Apa, DO as PCP - General (Family Medicine) Minus Breeding, MD as PCP - Cardiology (Cardiology) Dingeldein, Remo Lipps, MD (Ophthalmology) Rigoberto Noel, MD as Consulting Physician (Pulmonary Disease) [name], MD as Consulting Physician (Oncology) Minus Breeding, MD as Consulting Physician (Cardiology) Parrett, Fonnie Mu, NP as Nurse Practitioner (Pulmonary Disease) [facility] (Dermatology) Latanya Maudlin, MD as Consulting Physician (Orthopedic Surgery) [facility],  [place], [facility] (Pharmacist)  Recent office visits: 07/27/20 Etter Sjogren) - no changes to medication regular labs drawn  Recent consult visits: None since last CCM visit  Hospital visits: None since last CCM visit  Objective:  Lab Results  Component Value Date   CREATININE 1.53 (H) 08/22/2020   BUN 46 (H) 08/22/2020   GFR 32.35 (L) 07/27/2020   GFRNONAA 45 (L) 08/22/2020   GFRAA 42 (L) 03/16/2020   NA 139 08/22/2020   K 4.4 08/22/2020   CALCIUM 10.6 (H) 08/22/2020   CO2 26 08/22/2020    Lab Results  Component Value Date/Time   HGBA1C 6.7 (H) 07/27/2020 08:58 AM   HGBA1C 7.0 (H) 04/18/2020 09:56 AM   GFR 32.35 (L) 07/27/2020 08:58 AM   GFR 37.32 (L) 11/22/2019 10:37 AM   MICROALBUR <0.7 07/27/2020 08:58 AM   MICROALBUR <0.2 04/18/2020 09:56 AM    Last diabetic Eye exam:  Lab Results  Component Value Date/Time   HMDIABEYEEXA No Retinopathy 05/21/2018 12:00 AM    Last diabetic Foot exam: No results found for: HMDIABFOOTEX   Lab Results  Component Value Date   CHOL 159 07/27/2020   HDL 29.60 (L) 07/27/2020   [place]  04/18/2020     Comment:     . LDL cholesterol not calculated. Triglyceride levels greater than 400 mg/dL invalidate calculated LDL results. . Reference range: <100 . Desirable range <100 mg/dL for primary prevention;   <70 mg/dL for patients with CHD or diabetic patients  with > or = 2 CHD risk factors. [name] LDL-C is now calculated using the Martin-Hopkins  calculation, which is a validated novel method providing  better accuracy than the Friedewald equation  in the  estimation of LDL-C.  [name] et al. [name]. 5379;432(76): 2061-2068  (http://education.[employer].com/faq/FAQ164)    LDLDIRECT 47.0 07/27/2020   TRIG (H) 07/27/2020    839.0 Triglyceride is over 400; calculations on Lipids are invalid.   CHOLHDL 5 07/27/2020    Hepatic Function Latest Ref Rng & Units 08/22/2020 07/27/2020 07/25/2020  Total Protein 6.5 - 8.1  g/dL 6.9 7.3 7.2  Albumin 3.5 - 5.0 g/dL 4.7 4.9 4.5  AST 15 - 41 U/L _0 ALT 0 - 44 U/L _1 Alk Phosphatase 38 - 126 U/L 55 64 57  Total Bilirubin 0.3 - 1.2 mg/dL 0.5 0.6 0.4  Bilirubin, Direct 0.0 - 0.3 mg/dL - - -    Lab Results  Component Value Date/Time   TSH 2.450 07/27/2018 03:24 PM   TSH 2.101 08/18/2017 06:08 AM   TSH 2.53 07/28/2015 10:04 AM   FREET4 0.87 06/21/2014 04:45 PM    CBC Latest Ref Rng & Units 08/22/2020 07/25/2020 06/20/2020  WBC 4.0 - 10.5 K/uL 8.1 6.8 6.0  Hemoglobin 13.0 - 17.0 g/dL 12.8(L) 12.3(L) 12.4(L)  Hematocrit 39.0 - 52.0 % 39.5 38.1(L) 38.8(L)  Platelets 150 - 400 K/uL 236 202 179    Lab Results  Component Value Date/Time   VD25OH 16.1 (L) 07/27/2018 03:24 PM    Clinical ASCVD: No  The ASCVD Risk score Mikey Bussing DC Jr., et al., 2013) failed to calculate for the following reasons:   The 2013 ASCVD risk score is only valid for ages 6 to 35   The patient has a prior MI or stroke diagnosis    Depression screen [facility] 2/9 01/11/2020 11/22/2019 10/21/2018  Decreased Interest 0 0 0  Down, Depressed, Hopeless 0 0 0  PHQ - 2 Score 0 0 0  Some recent data might be hidden      Social History   Tobacco Use  Smoking Status Never Smoker  Smokeless Tobacco Never Used   BP Readings from Last 3 Encounters:  08/22/20 122/64  07/27/20 130/70  07/25/20 127/64   Pulse Readings from Last 3 Encounters:  08/22/20 69  07/27/20 75  07/25/20 70   Wt Readings from Last 3 Encounters:  08/22/20 253 lb (114.8 kg)  07/27/20 256 lb 12.8 oz (116.5 kg)  07/25/20 258 lb (117 kg)    Assessment/Interventions: Review of patient past medical history, allergies, medications, health status, including review of consultants reports, laboratory and other test data, was performed as part of comprehensive evaluation and provision of chronic care management services.   SDOH:  (Social Determinants of Health) assessments and interventions performed: No   CCM Care  Plan  Allergies  Allergen Reactions  . Benazepril Swelling    angioedema; he is not a candidate for any angiotensin receptor blockers because of this significant allergic reaction. Because of a history of documented adverse serious drug reaction;Medi Alert bracelet  is recommended  . Hctz [Hydrochlorothiazide] Anaphylaxis and Swelling    Tongue and lip swelling   . Aspirin Other (See Comments)    Gastritis, cant take 325 Mg aspirin   . Lactose Intolerance (Gi) Nausea And Vomiting    Medications Reviewed Today    Reviewed by Edythe Clarity, [facility] (Pharmacist) on 09/13/20 at 1132  Med List Status: <None>  Medication Order Taking? Sig Documenting Provider Last Dose Status Informant  allopurinol (ZYLOPRIM) 300 MG tablet [id number] Yes Take 450 mg by mouth daily. [provider] Taking Active Self  aluminum hydroxide-magnesium carbonate (GAVISCON)  95-358 MG/15ML SUSP [id number] Yes Take 15 mLs by mouth as needed for indigestion or heartburn. [provider] Taking Active Self  amLODipine (NORVASC) 5 MG tablet [id number] Yes TAKE 1 TABLET BY MOUTH TWICE A Kelton Pillar, MD Taking Active   atorvastatin (LIPITOR) 80 MG tablet [id number] Yes TAKE ONE TABLET BY MOUTH AT BEDTIME Minus Breeding, MD Taking Active   azelastine (ASTELIN) 0.1 % nasal spray [id number] Yes Place 2 sprays into both nostrils at bedtime as needed for rhinitis or allergies. Carollee Herter, Kendrick Fries R, DO Taking Active   ELIQUIS 2.5 MG TABS tablet [id number] Yes TAKE 1 TABLET BY MOUTH TWICE A Kelton Pillar, MD Taking Active   esomeprazole (NEXIUM) 40 MG capsule [id number] Yes Take 1 capsule (40 mg total) by mouth daily. [name], MD Taking Active Self  ezetimibe (ZETIA) 10 MG tablet [id number] Yes TAKE 1 TABLET BY MOUTH EVERY DAY Minus Breeding, MD Taking Active   fenofibrate 160 MG tablet [id number] Yes Take 1 tablet (160 mg total) by mouth daily. Roma Schanz R, DO Taking Active   folic acid  (FOLVITE) 1 MG tablet [id number] Yes TAKE 2 TABLETS BY MOUTH EVERY DAY [name], MD Taking Active   FREESTYLE LITE test strip [id number] Yes USE TO TEST BLOOD SUGAR ONCE A DAY. DX CODE: E11.9 [name], DO Taking Active   glimepiride (AMARYL) 1 MG tablet [id number] Yes TAKE 1 TABLET (1 MG TOTAL) BY MOUTH DAILY WITH BREAKFAST. Roma Schanz R, DO Taking Active   hydrocortisone cream 1 % [id number] Yes Apply 1 application topically 2 (two) times daily. Nche, Charlene Brooke, NP Taking Active   Lancets (FREESTYLE) lancets [id number] Yes USE TWICE A DAY TO CHECK BLOOD SUGAR. DX E11.9 Roma Schanz R, DO Taking Active   metFORMIN (GLUCOPHAGE) 1000 MG tablet [id number] Yes TAKE 1 & 1/2 TABLETS BY MOUTH EVERY MORNING AND 1 TABLET IN THE EVENING. Roma Schanz R, DO Taking Active   metoprolol tartrate (LOPRESSOR) 25 MG tablet [id number] Yes TAKE ONE-HALF TABLET BY MOUTH TWICE DAILY Minus Breeding, MD Taking Active   nitroGLYCERIN (NITROSTAT) 0.4 MG SL tablet [id number] Yes PLACE 1 TABLET (0.4 MG TOTAL) UNDER THE TONGUE EVERY 5 (FIVE) MINUTES AS NEEDED FOR CHEST PAIN. Minus Breeding, MD Taking Active   polycarbophil (FIBERCON) 625 MG tablet [id number] Yes Take 625 mg by mouth daily. [provider] Taking Active Self  potassium chloride SA (KLOR-CON M20) 20 MEQ tablet [id number] Yes Take 2 tablets (40 mEq total) by mouth 2 (two) times daily. Minus Breeding, MD Taking Active   pregabalin (LYRICA) 150 MG capsule [id number] Yes TAKE 1 CAPSULE BY MOUTH TWICE A DAY Lowne Chase, Yvonne R, DO Taking Active   psyllium (METAMUCIL) 58.6 % powder [id number] Yes Take 1 packet by mouth daily as needed (constipation).  [provider] Taking Active Self  torsemide (DEMADEX) 20 MG tablet [id number] Yes Take 40 mg by mouth daily. [provider] Taking Active   VASCEPA 1 g capsule [id number] Yes TAKE 2 CAPSULES BY MOUTH TWICE A DAY [name], DO Taking Active    Med List Note Henderson Newcomer, CPhT 06/10/13 2336): cpap machine 2 L of oxygen at night          Patient Active Problem List   Diagnosis Date Noted  . Senile purpura ([place]) 07/27/2020  . Porokeratosis 06/20/2020  . Pain due to onychomycosis of toenails of both feet 12/14/2019  . Educated about COVID-19 virus  infection 11/25/2019  . Uncontrolled type 2 diabetes mellitus with hypoglycemia without coma ([place]) 08/10/2019  . Hyperlipidemia associated with type 2 diabetes mellitus ([place]) 08/10/2019  . S/P left TKA 06/29/2019  . Peripheral neuropathy 07/27/2018  . Uncontrolled type 2 diabetes mellitus with hyperglycemia ([place]) 05/13/2018  . Diabetic peripheral neuropathy associated with type 2 diabetes mellitus ([place]) 05/13/2018  . Pansinusitis 05/13/2018  . Severe aortic stenosis   . Complete heart block ([place])   . Paroxysmal atrial fibrillation (HCC)   . Acute on chronic diastolic CHF (congestive heart failure), NYHA class 2 ([place])   . Left arm swelling   . Status post transcatheter aortic valve replacement (TAVR) using bioprosthesis 09/09/2017  . Demand ischemia of myocardium ([place])   . Typical atrial flutter ([place])   . Acute on chronic diastolic heart failure ([place])   . GIB (gastrointestinal bleeding) 08/16/2017  . Hyperlipidemia 08/05/2017  . NSTEMI (non-ST elevated myocardial infarction) ([place])   . Aortic stenosis, severe 07/25/2017  . Pancytopenia ([place]) 07/24/2017  . 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 ([place]) 02/27/2017  . Bradycardia 01/04/2017  . Coronary artery disease 01/04/2017  . Stenosis of carotid artery 01/04/2017  . Obesity 09/18/2016  . Wenckebach block   . DM (diabetes mellitus) type II uncontrolled, periph vascular disorder ([place]) 05/21/2013  . Spinal stenosis of lumbar region 04/08/2013  . Anemia, iron deficiency 11/29/2011  . B12 deficiency anemia 07/30/2011  . OSA (obstructive sleep apnea) 07/06/2010  . CAD, ARTERY BYPASS GRAFT 08/22/2009  .  Essential hypertension 03/29/2009  . Bilateral lower extremity edema 02/21/2009  . Hyperlipidemia LDL goal <70 11/26/2008    Immunization History  Administered Date(s) Administered  . Influenza Split 06/12/2011, 05/31/2013  . Influenza Whole 05/01/2012  . Influenza, High Dose Seasonal PF 02/09/2015, 03/12/2017, 03/09/2018, 03/02/2020  . Influenza-Unspecified 02/11/2014, 02/29/2016, 02/25/2019  . Moderna Sars-Covid-2 Vaccination 08/30/2019, 09/30/2019  . Pneumococcal Conjugate-13 09/18/2015  . Pneumococcal Polysaccharide-23 06/20/2013  . Td 03/20/2010, 03/16/2013, 09/29/2017  . Zoster Recombinat (Shingrix) 11/21/2016, 09/25/2017    Conditions to be addressed/monitored:  Hypertension, Hyperlipidemia/CAD, Diabetes, AFib, Heart Failure, Neuropathy, Gout, GERD, Allergic Rhinitis  Care Plan : General Pharmacy (Adult)  Updates made by Edythe Clarity, RPH since 09/13/2020 12:00 AM    Problem: HTN, HLD, DM, HF   Priority: High  Onset Date: 09/13/2020    Long-Range Goal: Patient-Specific Goal   Start Date: 09/13/2020  Expected End Date: 03/16/2021  This Visit's Progress: On track  Priority: High  Note:   Current Barriers:  . No barriers currently at this time.  Pharmacist Clinical Goal(s):  [name] Over the next 90 days, patient will achieve adherence to monitoring guidelines and medication adherence to achieve therapeutic efficacy . maintain control of blood pressure and blood sugar as evidenced by home monitoring.  . contact provider office for questions/concerns as evidenced notation of same in electronic health record through collaboration with PharmD and provider.  . Work to bring down TG's by dietary modifications and continued exercised.  Interventions: . 1:1 collaboration with Carollee Herter, Alferd Apa, DO regarding development and update of comprehensive plan of care as evidenced by provider attestation and co-signature . Inter-disciplinary care team collaboration (see longitudinal  plan of care) . Comprehensive medication review performed; medication list updated in electronic medical record  Hypertension (BP goal <130/80) -Controlled -Current treatment:  Amlodipine 47m daily   Metoprolol Tartrate 240m#0.5 tabs twice daily  -Medications previously tried: benazepril, HCTZ  -Current home readings: 125-130/70s  Diet Cutting sweets, only  eating fried foods 1-2x/week, rarely eats sausage (~4x/year) States he is "hooked on cheese" B - cereal (cheerios) w/ 2% milk L - roasted [place] meat w/ cheese, dukes mayo, lettuce, and whole wheat bread D - Olympics - pork chop with salad Snacks - PB before bedtime, eats jalapeno potatoe chips occasionally  Drinks - No longer drinking sodas - has replaced with sparkling water   Exercise Plays golf 3x/week Dose 60 leg raises and leg bends Starting to walk on his treadmill  -Denies hypotensive/hypertensive symptoms -Educated on BP goals and benefits of medications for prevention of heart attack, stroke and kidney damage; Daily salt intake goal < 2300 mg; Exercise goal of 150 minutes per week; Importance of home blood pressure monitoring; -Counseled to monitor BP at home daily, document, and provide log at future appointments -Patient has been losing weight from exercising and making better dietary choices! Congratulated on these efforts.  States he feels great! -Counseled on diet and exercise extensively Recommended to continue current medication  Hyperlipidemia: (LDL goal < 70) -Uncontrolled (TG's) -Current treatment:  Atorvastatin 4m daily  Ezetimibe 175mdaily  Fenofibrate 16090maily  Vascepa 1g #2 twice daily   Nitroglycerin 0.4mg21m needed -Medications previously tried: none noted -Current dietary patterns: watching his sweets -Current exercise habits: walking on treadmill -Educated on Cholesterol goals;  Benefits of statin for ASCVD risk reduction; Importance of limiting foods high in  cholesterol; Exercise goal of 150 minutes per week;  -Patient going to lipid clinic on May 1st, reports he has lost about 13 pounds -Encouraged him to keep this appt -Last lipid panel showed continued elevated TG -Recommended to continue current medication Counseled on importance of keeping appt with lipid clinic.  Diabetes (A1c goal <7%) -Controlled -Current medications:  Glimepiride 1mg 16mly   Metformin 1000mg 50m tabs AM, and #1 tab PM -Medications previously tried: none noted  -Current home glucose readings . fasting glucose: 100-112 . post prandial glucose: unavailable -Denies hypoglycemic/hyperglycemic symptoms -Current meal patterns: see above -Current exercise: starting to walk on his treadmill, reports he is getting about 3-3.5 miles of activity on his tracker per day!! -Educated on A1c and blood sugar goals; Exercise goal of 150 minutes per week; Benefits of weight loss; Prevention and management of hypoglycemic episodes; -Counseled to check feet daily and get yearly eye exams -He denies any hypoglycemia -Recommended to continue current medication  Heart Failure (Goal: manage symptoms and prevent exacerbations) -Controlled  -Followed by cardiology Dr. HochrePercival Spanish ejection fraction: 11/05/2018 50/55% -HF type: Diastolic  -Current treatment:  Torsemide 20mg #69mily  Potassium 20mEq #78mice daily -Medications previously tried: furosemide  -Current home BP/HR readings: controlled -Current dietary habits: see above -Current exercise habits: see above -Educated on Benefits of medications for managing symptoms and prolonging life Importance of weighing daily; if you gain more than 3 pounds in one day or 5 pounds in one week, call providers  -Weighs daily -Is currently losing weight from exercising -Denies any swelling or SOB, very happy with torsemide! -Recommended to continue current medication   Patient Goals/Self-Care Activities . Over the next 90  days, patient will:  - take medications as prescribed check glucose daily, document, and provide at future appointments check blood pressure daily, document, and provide at future appointments target a minimum of 150 minutes of moderate intensity exercise weekly engage in dietary modifications by continuing to cut back on sweets and other cholesterol containing foods  Follow Up Plan: The care management team will reach out to  the patient again over the next 90 days.        Medication Assistance: None required.  Patient affirms current coverage meets needs.  Patient's preferred pharmacy is:  [facility] [place], [place] [address] Phone: [phone] Fax: [phone]  Uses pill box? Yes  - AM and PM box Pt endorses 100% compliance  We discussed: Benefits of medication synchronization, packaging and delivery as well as enhanced pharmacist oversight with [employer]. Patient decided to: Continue current medication management strategy  Care Plan and Follow Up Patient Decision:  Patient agrees to Care Plan and Follow-up.  Plan: The care management team will reach out to the patient again over the next 90 days.  Beverly Milch, PharmD Clinical Pharmacist [place] [phone]

## 2020-09-12 ENCOUNTER — Ambulatory Visit (INDEPENDENT_AMBULATORY_CARE_PROVIDER_SITE_OTHER): Payer: Medicare Other | Admitting: Pharmacist

## 2020-09-12 ENCOUNTER — Other Ambulatory Visit: Payer: Self-pay | Admitting: Family Medicine

## 2020-09-12 DIAGNOSIS — E1165 Type 2 diabetes mellitus with hyperglycemia: Secondary | ICD-10-CM | POA: Diagnosis not present

## 2020-09-12 DIAGNOSIS — I10 Essential (primary) hypertension: Secondary | ICD-10-CM

## 2020-09-12 DIAGNOSIS — E785 Hyperlipidemia, unspecified: Secondary | ICD-10-CM

## 2020-09-12 DIAGNOSIS — I5033 Acute on chronic diastolic (congestive) heart failure: Secondary | ICD-10-CM | POA: Diagnosis not present

## 2020-09-12 DIAGNOSIS — IMO0002 Reserved for concepts with insufficient information to code with codable children: Secondary | ICD-10-CM

## 2020-09-12 DIAGNOSIS — E1169 Type 2 diabetes mellitus with other specified complication: Secondary | ICD-10-CM | POA: Diagnosis not present

## 2020-09-13 DIAGNOSIS — M5459 Other low back pain: Secondary | ICD-10-CM | POA: Diagnosis not present

## 2020-09-13 NOTE — Patient Instructions (Addendum)
Visit Information  Goals Addressed            This Visit's Progress   . Lifestyle Change-Hypertension       Timeframe:  Long-Range Goal Priority:  High Start Date:     09/12/20                        Expected End Date:    03/15/21                   Follow Up Date 12/28/20   - agree to work together to make changes - ask questions to understand - learn about high blood pressure    Why is this important?    The changes that you are asked to make may be hard to do.   This is especially true when the changes are life-long.   Knowing why it is important to you is the first step.   Working on the change with your family or support person helps you not feel alone.   Reward yourself and family or support person when goals are met. This can be an activity you choose like bowling, hiking, biking, swimming or shooting hoops.     Notes: Continue positive lifestyle changes already in progress!!      Patient Care Plan: General Pharmacy (Adult)    Problem Identified: HTN, HLD, DM, HF   Priority: High  Onset Date: 09/13/2020    Long-Range Goal: Patient-Specific Goal   Start Date: 09/13/2020  Expected End Date: 03/16/2021  This Visit's Progress: On track  Priority: High  Note:   Current Barriers:  . No barriers currently at this time.  Pharmacist Clinical Goal(s):  [name] Over the next 90 days, patient will achieve adherence to monitoring guidelines and medication adherence to achieve therapeutic efficacy . maintain control of blood pressure and blood sugar as evidenced by home monitoring.  . contact provider office for questions/concerns as evidenced notation of same in electronic health record through collaboration with PharmD and provider.  . Work to bring down TG's by dietary modifications and continued exercised.  Interventions: . 1:1 collaboration with Carollee Herter, Alferd Apa, DO regarding development and update of comprehensive plan of care as evidenced by provider attestation  and co-signature . Inter-disciplinary care team collaboration (see longitudinal plan of care) . Comprehensive medication review performed; medication list updated in electronic medical record  Hypertension (BP goal <130/80) -Controlled -Current treatment:  Amlodipine 3m daily   Metoprolol Tartrate 236m#0.5 tabs twice daily  -Medications previously tried: benazepril, HCTZ  -Current home readings: 125-130/70s  Diet Cutting sweets, only eating fried foods 1-2x/week, rarely eats sausage (~4x/year) States he is "hooked on cheese" B - cereal (cheerios) w/ 2% milk L - roasted tu[place]uwaiteat w/ cheese, dukes mayo, lettuce, and whole wheat bread D - Olympics - pork chop with salad Snacks - PB before bedtime, eats jalapeno potatoe chips occasionally  Drinks - No longer drinking sodas - has replaced with sparkling water   Exercise Plays golf 3x/week Dose 60 leg raises and leg bends Starting to walk on his treadmill  -Denies hypotensive/hypertensive symptoms -Educated on BP goals and benefits of medications for prevention of heart attack, stroke and kidney damage; Daily salt intake goal < 2300 mg; Exercise goal of 150 minutes per week; Importance of home blood pressure monitoring; -Counseled to monitor BP at home daily, document, and provide log at future appointments -Patient has been losing weight from exercising and making better  dietary choices! Congratulated on these efforts.  States he feels great! -Counseled on diet and exercise extensively Recommended to continue current medication  Hyperlipidemia: (LDL goal < 70) -Uncontrolled (TG's) -Current treatment:  Atorvastatin 64m daily  Ezetimibe 140mdaily  Fenofibrate 16073maily  Vascepa 1g #2 twice daily   Nitroglycerin 0.4mg32m needed -Medications previously tried: none noted -Current dietary patterns: watching his sweets -Current exercise habits: walking on treadmill -Educated on Cholesterol goals;  Benefits of statin  for ASCVD risk reduction; Importance of limiting foods high in cholesterol; Exercise goal of 150 minutes per week;  -Patient going to lipid clinic on May 1st, reports he has lost about 13 pounds -Encouraged him to keep this appt -Last lipid panel showed continued elevated TG -Recommended to continue current medication Counseled on importance of keeping appt with lipid clinic.  Diabetes (A1c goal <7%) -Controlled -Current medications:  Glimepiride 1mg 62mly   Metformin 1000mg 37m tabs AM, and #1 tab PM -Medications previously tried: none noted  -Current home glucose readings . fasting glucose: 100-112 . post prandial glucose: unavailable -Denies hypoglycemic/hyperglycemic symptoms -Current meal patterns: see above -Current exercise: starting to walk on his treadmill, reports he is getting about 3-3.5 miles of activity on his tracker per day!! -Educated on A1c and blood sugar goals; Exercise goal of 150 minutes per week; Benefits of weight loss; Prevention and management of hypoglycemic episodes; -Counseled to check feet daily and get yearly eye exams -He denies any hypoglycemia -Recommended to continue current medication  Heart Failure (Goal: manage symptoms and prevent exacerbations) -Controlled  -Followed by cardiology Dr. HochrePercival Spanish ejection fraction: 11/05/2018 50/55% -HF type: Diastolic  -Current treatment:  Torsemide 20mg #12mily  Potassium 20mEq #73mice daily -Medications previously tried: furosemide  -Current home BP/HR readings: controlled -Current dietary habits: see above -Current exercise habits: see above -Educated on Benefits of medications for managing symptoms and prolonging life Importance of weighing daily; if you gain more than 3 pounds in one day or 5 pounds in one week, call providers  -Weighs daily -Is currently losing weight from exercising -Denies any swelling or SOB, very happy with torsemide! -Recommended to continue current  medication   Patient Goals/Self-Care Activities . Over the next 90 days, patient will:  - take medications as prescribed check glucose daily, document, and provide at future appointments check blood pressure daily, document, and provide at future appointments target a minimum of 150 minutes of moderate intensity exercise weekly engage in dietary modifications by continuing to cut back on sweets and other cholesterol containing foods  Follow Up Plan: The care management team will reach out to the patient again over the next 90 days.        The patient verbalized understanding of instructions, educational materials, and care plan provided today and agreed to receive a mailed copy of patient instructions, educational materials, and care plan.  Telephone follow up appointment with pharmacy team member scheduled for: 3 months  [facility]Edy[facility] Center[facility], Self-Care Heart failure is a serious condition. The following information explains the things you need to do to take care of yourself after a heart failure diagnosis. You may be asked to change your diet, take certain medicines, and make other lifestyle changes in order to stay as healthy as possible. Your health care provider may also give you more specific instructions. If you have problems or questions, contact your health care provider. What are the risks? Having heart failure puts you at higher risk for  certain problems. These problems can get worse if you do not take good care of yourself. Problems may include:  Damage to the kidneys, liver, or lungs.  Malnutrition.  Abnormal heart rhythms.  Blood clotting issues that could cause a stroke. Supplies needed:  Scale for monitoring weight.  Blood pressure monitor.  Notebook.  Medicines. How to care for yourself when you have heart failure Medicines Take over-the-counter and prescription medicines only as told by your health care provider. Medicines reduce the  workload of your heart, slow the progression of heart failure, and improve symptoms. Take your medicines every day.  Do not stop taking your medicine unless your health care provider tells you to do so.  Do not skip any dose of medicine.  Refill your prescriptions before you run out of medicine.  Talk with your health care provider if you cannot afford your medicines. Eating and drinking  Eat heart-healthy foods. Talk with a dietitian to make an eating plan that is right for you. ? Limit salt (sodium) if told by your health care provider. Sodium restriction may reduce symptoms of heart failure. Ask a dietitian to recommend heart-healthy seasonings. ? Use healthy cooking methods instead of frying. Healthy methods include roasting, grilling, broiling, baking, poaching, steaming, and stir-frying. ? Choose foods that contain no trans fat and are low in saturated fat and cholesterol. Healthy choices include fresh or frozen fruits and vegetables, fish, lean meats, legumes, fat-free or low-fat dairy products, and whole-grain or high-fiber foods.  Limit your fluid intake, if directed by your health care provider. Fluid restriction may reduce symptoms of heart failure.   Alcohol use  Do not drink alcohol if: ? Your health care provider tells you not to drink. ? Your heart was damaged by alcohol, or you have severe heart failure. ? You are pregnant, may be pregnant, or are planning to become pregnant.  If you drink alcohol: ? Limit how much you have to:  0-1 drink a day for women.  0-2 drinks a day for men. ? Know how much alcohol is in your drink. In the [place] one drink equals one 12 oz bottle of beer (355 mL), one 5 oz glass of wine (148 mL), or one 1 oz glass of hard liquor (44 mL). Lifestyle  Do not use any products that contain nicotine or tobacco. These products include cigarettes, chewing tobacco, and vaping devices, such as e-cigarettes. If you need help quitting, ask your health care  provider. ? Do not use nicotine gum or patches before talking to your health care provider.  Do not use illegal drugs.  Work with your health care provider to safely reach the right body weight.  Do physical activity if told by your health care provider. Talk to your health care provider before you begin an exercise if: ? You are an older adult. ? You have severe heart failure.  Learn to manage stress. If you need help to do this, ask your health care provider.  Participate in or seek physical rehabilitation as needed to keep or improve your independence and quality of life.  Participate in a cardiac rehabilitation program, which is a treatment program to improve your health and well-being through exercise training, education, and counseling.  Plan rest periods when you get tired.   Monitoring important information  Weigh yourself every day. This will help you to notice if too much fluid is building up in your body. ? Weigh yourself every morning after you urinate and before you eat  breakfast. ? Wear the same amount of clothing each time you weigh yourself. ? Record your daily weight. Provide your health care provider with your weight record.  Monitor and record your pulse and blood pressure as told by your health care provider.   Dealing with extreme temperatures  If the weather is extremely hot: ? Avoid vigorous physical activity. ? Use air conditioning or fans, or find a cooler location. ? Avoid caffeine and alcohol. ? Wear loose-fitting, lightweight, and light-colored clothing.  If the weather is extremely cold: ? Avoid vigorous activity. ? Layer your clothes. ? Wear mittens or gloves, a hat, and a face covering when you go outside. ? Avoid alcohol. Follow these instructions at home:  Stay up to date with vaccines. Pneumococcal and flu (influenza) vaccines are especially important in preventing infections of the airways.  Keep all follow-up visits. This is  important. Contact a health care provider if you:  Gain 2-3 lb (1-1.4 kg) in 24 hours or 5 lb (2.3 kg) in a week.  Have increasing shortness of breath.  Are unable to participate in your usual physical activities.  Get tired easily.  Cough more than normal, especially with physical activity.  Lose your appetite or feel nauseous.  Have any swelling or more swelling in areas such as your hands, feet, ankles, or abdomen.  Are unable to sleep because it is hard to breathe.  Feel like your heart is beating quickly (palpitations).  Become dizzy or light-headed when you stand up.  Have feelings of depression or sadness. Get help right away if you:  Have trouble breathing.  Notice, or your family notices, a change in your awareness, such as having trouble staying awake or concentrating.  Have pain or discomfort in your chest.  Have an episode of fainting (syncope). These symptoms may represent a serious problem that is an emergency. Do not wait to see if the symptoms will go away. Get medical help right away. Call your local emergency services (911 in the [place] Do not drive yourself to the hospital. Summary  Heart failure is a serious condition. To care for yourself, you may be asked to change your diet, take certain medicines, and make other lifestyle changes.  Take your medicines every day. Do not stop taking them unless your health care provider tells you to do so.  Limit salt and eat heart-healthy foods, such as fresh or frozen fruits and vegetables, fish, lean meats, legumes, fat-free or low-fat dairy products, and whole-grain or high-fiber foods.  Ask your health care provider if you have any alcohol restrictions. You may have to stop drinking alcohol if you have severe heart failure.  Contact your health care provider if you notice problems, such as rapid weight gain or a fast heartbeat. Get help right away if you faint or have chest pain or trouble breathing. This  information is not intended to replace advice given to you by your health care provider. Make sure you discuss any questions you have with your health care provider. Document Revised: 01/08/2020 Document Reviewed: 01/08/2020 Elsevier Patient Education  [employer].

## 2020-09-19 ENCOUNTER — Inpatient Hospital Stay: Payer: Medicare Other | Attending: Hematology & Oncology

## 2020-09-19 ENCOUNTER — Inpatient Hospital Stay (HOSPITAL_BASED_OUTPATIENT_CLINIC_OR_DEPARTMENT_OTHER): Payer: Medicare Other | Admitting: Family

## 2020-09-19 ENCOUNTER — Inpatient Hospital Stay: Payer: Medicare Other | Admitting: Family

## 2020-09-19 ENCOUNTER — Telehealth: Payer: Self-pay | Admitting: Family

## 2020-09-19 ENCOUNTER — Encounter: Payer: Self-pay | Admitting: Podiatry

## 2020-09-19 ENCOUNTER — Inpatient Hospital Stay: Payer: Medicare Other

## 2020-09-19 ENCOUNTER — Ambulatory Visit (INDEPENDENT_AMBULATORY_CARE_PROVIDER_SITE_OTHER): Payer: Medicare Other | Admitting: Podiatry

## 2020-09-19 ENCOUNTER — Other Ambulatory Visit: Payer: Self-pay

## 2020-09-19 VITALS — BP 133/67 | HR 70 | Temp 97.8°F | Resp 18 | Ht 73.0 in | Wt 252.1 lb

## 2020-09-19 DIAGNOSIS — C8332 Diffuse large B-cell lymphoma, intrathoracic lymph nodes: Secondary | ICD-10-CM

## 2020-09-19 DIAGNOSIS — D519 Vitamin B12 deficiency anemia, unspecified: Secondary | ICD-10-CM

## 2020-09-19 DIAGNOSIS — M79675 Pain in left toe(s): Secondary | ICD-10-CM | POA: Diagnosis not present

## 2020-09-19 DIAGNOSIS — D508 Other iron deficiency anemias: Secondary | ICD-10-CM

## 2020-09-19 DIAGNOSIS — B351 Tinea unguium: Secondary | ICD-10-CM

## 2020-09-19 DIAGNOSIS — D500 Iron deficiency anemia secondary to blood loss (chronic): Secondary | ICD-10-CM | POA: Diagnosis not present

## 2020-09-19 DIAGNOSIS — E1165 Type 2 diabetes mellitus with hyperglycemia: Secondary | ICD-10-CM

## 2020-09-19 DIAGNOSIS — Q828 Other specified congenital malformations of skin: Secondary | ICD-10-CM

## 2020-09-19 DIAGNOSIS — M79674 Pain in right toe(s): Secondary | ICD-10-CM

## 2020-09-19 DIAGNOSIS — C8338 Diffuse large B-cell lymphoma, lymph nodes of multiple sites: Secondary | ICD-10-CM | POA: Diagnosis not present

## 2020-09-19 DIAGNOSIS — G620 Drug-induced polyneuropathy: Secondary | ICD-10-CM | POA: Diagnosis not present

## 2020-09-19 DIAGNOSIS — D510 Vitamin B12 deficiency anemia due to intrinsic factor deficiency: Secondary | ICD-10-CM | POA: Diagnosis not present

## 2020-09-19 DIAGNOSIS — Z886 Allergy status to analgesic agent status: Secondary | ICD-10-CM | POA: Diagnosis not present

## 2020-09-19 DIAGNOSIS — E1142 Type 2 diabetes mellitus with diabetic polyneuropathy: Secondary | ICD-10-CM

## 2020-09-19 DIAGNOSIS — I25810 Atherosclerosis of coronary artery bypass graft(s) without angina pectoris: Secondary | ICD-10-CM | POA: Diagnosis not present

## 2020-09-19 LAB — CMP (CANCER CENTER ONLY)
ALT: 20 U/L (ref 0–44)
AST: 16 U/L (ref 15–41)
Albumin: 4.6 g/dL (ref 3.5–5.0)
Alkaline Phosphatase: 55 U/L (ref 38–126)
Anion gap: 9 (ref 5–15)
BUN: 52 mg/dL — ABNORMAL HIGH (ref 8–23)
CO2: 28 mmol/L (ref 22–32)
Calcium: 10.8 mg/dL — ABNORMAL HIGH (ref 8.9–10.3)
Chloride: 101 mmol/L (ref 98–111)
Creatinine: 1.32 mg/dL — ABNORMAL HIGH (ref 0.61–1.24)
GFR, Estimated: 54 mL/min — ABNORMAL LOW (ref >60)
Glucose, Bld: 95 mg/dL (ref 70–99)
Potassium: 4.2 mmol/L (ref 3.5–5.1)
Sodium: 138 mmol/L (ref 135–145)
Total Bilirubin: 0.5 mg/dL (ref 0.3–1.2)
Total Protein: 6.9 g/dL (ref 6.5–8.1)

## 2020-09-19 LAB — CBC WITH DIFFERENTIAL (CANCER CENTER ONLY)
Abs Immature Granulocytes: 0.59 10*3/uL — ABNORMAL HIGH (ref 0.00–0.07)
Basophils Absolute: 0 10*3/uL (ref 0.0–0.1)
Basophils Relative: 0 %
Eosinophils Absolute: 0.1 10*3/uL (ref 0.0–0.5)
Eosinophils Relative: 1 %
HCT: 38.9 % — ABNORMAL LOW (ref 39.0–52.0)
Hemoglobin: 12.8 g/dL — ABNORMAL LOW (ref 13.0–17.0)
Immature Granulocytes: 6 %
Lymphocytes Relative: 19 %
Lymphs Abs: 2 10*3/uL (ref 0.7–4.0)
MCH: 29 pg (ref 26.0–34.0)
MCHC: 32.9 g/dL (ref 30.0–36.0)
MCV: 88 fL (ref 80.0–100.0)
Monocytes Absolute: 0.9 10*3/uL (ref 0.1–1.0)
Monocytes Relative: 8 %
Neutro Abs: 6.8 10*3/uL (ref 1.7–7.7)
Neutrophils Relative %: 66 %
Platelet Count: 247 10*3/uL (ref 150–400)
RBC: 4.42 MIL/uL (ref 4.22–5.81)
RDW: 16.3 % — ABNORMAL HIGH (ref 11.5–15.5)
WBC Count: 10.3 10*3/uL (ref 4.0–10.5)
nRBC: 0.2 % (ref 0.0–0.2)

## 2020-09-19 LAB — IRON AND TIBC
Iron: 90 ug/dL (ref 42–163)
Saturation Ratios: 24 % (ref 20–55)
TIBC: 370 ug/dL (ref 202–409)
UIBC: 280 ug/dL (ref 117–376)

## 2020-09-19 LAB — VITAMIN B12: Vitamin B-12: 122 pg/mL — ABNORMAL LOW (ref 180–914)

## 2020-09-19 LAB — RETICULOCYTES
Immature Retic Fract: 32 % — ABNORMAL HIGH (ref 2.3–15.9)
RBC.: 4.43 MIL/uL (ref 4.22–5.81)
Retic Count, Absolute: 95.7 10*3/uL (ref 19.0–186.0)
Retic Ct Pct: 2.2 % (ref 0.4–3.1)

## 2020-09-19 LAB — FERRITIN: Ferritin: 1242 ng/mL — ABNORMAL HIGH (ref 24–336)

## 2020-09-19 MED ORDER — CYANOCOBALAMIN 1000 MCG/ML IJ SOLN
1000.0000 ug | Freq: Once | INTRAMUSCULAR | Status: AC
  Administered 2020-09-19: 1000 ug via INTRAMUSCULAR

## 2020-09-19 MED ORDER — DENOSUMAB 120 MG/1.7ML ~~LOC~~ SOLN
120.0000 mg | Freq: Once | SUBCUTANEOUS | Status: AC
Start: 2020-09-19 — End: 2020-09-19
  Administered 2020-09-19: 120 mg via SUBCUTANEOUS

## 2020-09-19 MED ORDER — CYANOCOBALAMIN 1000 MCG/ML IJ SOLN
INTRAMUSCULAR | Status: AC
  Filled 2020-09-19: qty 1

## 2020-09-19 MED ORDER — DENOSUMAB 120 MG/1.7ML ~~LOC~~ SOLN
SUBCUTANEOUS | Status: AC
  Filled 2020-09-19: qty 1.7

## 2020-09-19 MED ORDER — SODIUM CHLORIDE 0.9% FLUSH
10.0000 mL | Freq: Once | INTRAVENOUS | Status: AC
  Administered 2020-09-19: 10 mL via INTRAVENOUS
  Filled 2020-09-19: qty 10

## 2020-09-19 MED ORDER — HEPARIN SOD (PORK) LOCK FLUSH 100 UNIT/ML IV SOLN
500.0000 [IU] | Freq: Once | INTRAVENOUS | Status: AC
  Administered 2020-09-19: 500 [IU] via INTRAVENOUS
  Filled 2020-09-19: qty 5

## 2020-09-19 NOTE — Progress Notes (Signed)
Hematology and Oncology Follow Up Visit  Richard [name] [id number] Aug 08, 1940 80 y.o. 09/19/2020   Principle Diagnosis:  Diffuse large cell non-Hodgkin's lymphoma (IPI = 3) - NOT "double hit" Pernicious anemia Iron deficiency secondary to bleeding  Past Therapy: R-CHOP - s/p cycle8 - completed 08/2017  Current Therapy: Vitamin B12 1 mg IM every month Xgeva 120 mg subcu q 3 months - next doseduein06/2022 IV ironas indicated   Interim History:  Richard [name] is here today for follow-up and injections. He is doing well and has no complaints at this time.  He is golfing 3 days a week and states that since having his aortic valve replacement he has not had SOB with exertion.  No fever, chills, n/v, cough, rash, dizziness, SOB, chest pain, palpitations, abdominal pain or changes in bowel or bladder habits.  No swelling in his extremities on his current diuretic regimen.   The neuropathy in his feet is unchanged.  He has been doing strengthening exercises which have helped with his knees and seem to be improving his balance.  No falls or syncope to report.  He states that he has a good appetite and is staying well hydrated. His weight is stable.   ECOG Performance Status: 1 - Symptomatic but completely ambulatory  Medications:  Allergies as of 09/19/2020      Reactions   Benazepril Swelling   angioedema; he is not a candidate for any angiotensin receptor blockers because of this significant allergic reaction. Because of a history of documented adverse serious drug reaction;Medi Alert bracelet  is recommended   Hctz [hydrochlorothiazide] Anaphylaxis, Swelling   Tongue and lip swelling   Aspirin Other (See Comments)   Gastritis, cant take 325 Mg aspirin    Lactose Intolerance (gi) Nausea And Vomiting      Medication List       Accurate as of September 19, 2020  9:47 AM. If you have any questions, ask your nurse or doctor.        allopurinol 300 MG tablet Commonly  known as: ZYLOPRIM Take 450 mg by mouth daily.   aluminum hydroxide-magnesium carbonate 95-358 MG/15ML Susp Commonly known as: GAVISCON Take 15 mLs by mouth as needed for indigestion or heartburn.   amLODipine 5 MG tablet Commonly known as: NORVASC TAKE 1 TABLET BY MOUTH TWICE A DAY   atorvastatin 80 MG tablet Commonly known as: LIPITOR TAKE ONE TABLET BY MOUTH AT BEDTIME   azelastine 0.1 % nasal spray Commonly known as: ASTELIN Place 2 sprays into both nostrils at bedtime as needed for rhinitis or allergies.   Eliquis 2.5 MG Tabs tablet Generic drug: apixaban TAKE 1 TABLET BY MOUTH TWICE A DAY   esomeprazole 40 MG capsule Commonly known as: NexIUM Take 1 capsule (40 mg total) by mouth daily.   ezetimibe 10 MG tablet Commonly known as: ZETIA TAKE 1 TABLET BY MOUTH EVERY DAY   fenofibrate 160 MG tablet Take 1 tablet (160 mg total) by mouth daily.   folic acid 1 MG tablet Commonly known as: FOLVITE TAKE 2 TABLETS BY MOUTH EVERY DAY   freestyle lancets USE TWICE A DAY TO CHECK BLOOD SUGAR. DX E11.9   FREESTYLE LITE test strip Generic drug: glucose blood USE TO TEST BLOOD SUGAR ONCE A DAY. DX CODE: E11.9   glimepiride 1 MG tablet Commonly known as: AMARYL TAKE 1 TABLET (1 MG TOTAL) BY MOUTH DAILY WITH BREAKFAST.   hydrocortisone cream 1 % Apply 1 application topically 2 (two) times daily.  metFORMIN 1000 MG tablet Commonly known as: GLUCOPHAGE TAKE 1 & 1/2 TABLETS BY MOUTH EVERY MORNING AND 1 TABLET IN THE EVENING.   metoprolol tartrate 25 MG tablet Commonly known as: LOPRESSOR TAKE ONE-HALF TABLET BY MOUTH TWICE DAILY   nitroGLYCERIN 0.4 MG SL tablet Commonly known as: NITROSTAT PLACE 1 TABLET (0.4 MG TOTAL) UNDER THE TONGUE EVERY 5 (FIVE) MINUTES AS NEEDED FOR CHEST PAIN.   polycarbophil 625 MG tablet Commonly known as: FIBERCON Take 625 mg by mouth daily.   potassium chloride SA 20 MEQ tablet Commonly known as: Klor-Con M20 Take 2 tablets (40 mEq  total) by mouth 2 (two) times daily.   pregabalin 150 MG capsule Commonly known as: LYRICA TAKE 1 CAPSULE BY MOUTH TWICE A DAY   psyllium 58.6 % powder Commonly known as: METAMUCIL Take 1 packet by mouth daily as needed (constipation).   torsemide 20 MG tablet Commonly known as: DEMADEX Take 40 mg by mouth daily.   Vascepa 1 g capsule Generic drug: icosapent Ethyl TAKE 2 CAPSULES BY MOUTH TWICE A DAY       Allergies:  Allergies  Allergen Reactions  . Benazepril Swelling    angioedema; he is not a candidate for any angiotensin receptor blockers because of this significant allergic reaction. Because of a history of documented adverse serious drug reaction;Medi Alert bracelet  is recommended  . Hctz [Hydrochlorothiazide] Anaphylaxis and Swelling    Tongue and lip swelling   . Aspirin Other (See Comments)    Gastritis, cant take 325 Mg aspirin   . Lactose Intolerance (Gi) Nausea And Vomiting    Past Medical History, Surgical history, Social history, and Family History were reviewed and updated.  Review of Systems: All other 10 point review of systems is negative.   Physical Exam:  vitals were not taken for this visit.   Wt Readings from Last 3 Encounters:  08/22/20 253 lb (114.8 kg)  07/27/20 256 lb 12.8 oz (116.5 kg)  07/25/20 258 lb (117 kg)    Ocular: Sclerae unicteric, pupils equal, round and reactive to light Ear-nose-throat: Oropharynx clear, dentition fair Lymphatic: No cervical or supraclavicular adenopathy Lungs no rales or rhonchi, good excursion bilaterally Heart regular rate and rhythm, no murmur appreciated Abd soft, nontender, positive bowel sounds MSK no focal spinal tenderness, no joint edema Neuro: non-focal, well-oriented, appropriate affect Breasts: Deferred   Lab Results  Component Value Date   WBC 10.3 09/19/2020   HGB 12.8 (L) 09/19/2020   HCT 38.9 (L) 09/19/2020   MCV 88.0 09/19/2020   PLT 247 09/19/2020   Lab Results  Component  Value Date   FERRITIN 898 (H) 08/22/2020   IRON 92 08/22/2020   TIBC 395 08/22/2020   UIBC 303 08/22/2020   IRONPCTSAT 23 08/22/2020   Lab Results  Component Value Date   RETICCTPCT 2.2 09/19/2020   RBC 4.43 09/19/2020   RBC 4.42 09/19/2020   RETICCTABS 52.1 11/22/2011   No results found for: Nils Pyle [facility] Lab Results  Component Value Date   IGA 159 03/09/2012   Lab Results  Component Value Date   ALBUMINELP 4.2 07/27/2018   MSPIKE Not Observed 07/27/2018     Chemistry      Component Value Date/Time   NA 138 09/19/2020 0906   NA 136 03/16/2019 0938   NA 144 06/27/2017 0857   K 4.2 09/19/2020 0906   K 3.9 06/27/2017 0857   CL 101 09/19/2020 0906   CL 103 06/27/2017 0857   CO2 28  09/19/2020 0906   CO2 26 06/27/2017 0857   BUN 52 (H) 09/19/2020 0906   BUN 34 (H) 03/16/2019 0938   BUN 12 06/27/2017 0857   CREATININE 1.32 (H) 09/19/2020 0906   CREATININE 1.65 (H) 04/18/2020 0956      Component Value Date/Time   CALCIUM 10.8 (H) 09/19/2020 0906   CALCIUM 9.2 06/27/2017 0857   ALKPHOS 55 09/19/2020 0906   ALKPHOS 128 (H) 06/27/2017 0857   AST 16 09/19/2020 0906   ALT 20 09/19/2020 0906   ALT 24 06/27/2017 0857   BILITOT 0.5 09/19/2020 0906       Impression and Plan: Richard [name] is a very pleasant 80yo caucasian gentleman withdiffuse large B-cell lymphoma (not"double hit"lymphoma). He completed treatment in February 2019. He will receive B 12 as well as Xgeva today.  Iron studies are pending. We will replace if needed.  Follow-up in 1 month.  He can contact our office with any questions or concerns.   Laverna Peace, NP 3/22/20229:47 AM

## 2020-09-19 NOTE — Patient Instructions (Signed)
Cyanocobalamin, Vitamin B12 injection What is this medicine? CYANOCOBALAMIN (sye an oh koe BAL a min) is a man made form of vitamin B12. Vitamin B12 is used in the growth of healthy blood cells, nerve cells, and proteins in the body. It also helps with the metabolism of fats and carbohydrates. This medicine is used to treat people who can not absorb vitamin B12. This medicine may be used for other purposes; ask your health care provider or pharmacist if you have questions. COMMON BRAND NAME(S): B-12 Compliance Kit, B-12 Injection Kit, Cyomin, LA-12, Nutri-Twelve, Physicians EZ Use B-12, Primabalt What should I tell my health care provider before I take this medicine? They need to know if you have any of these conditions:  kidney disease  Leber's disease  megaloblastic anemia  an unusual or allergic reaction to cyanocobalamin, cobalt, other medicines, foods, dyes, or preservatives  pregnant or trying to get pregnant  breast-feeding How should I use this medicine? This medicine is injected into a muscle or deeply under the skin. It is usually given by a health care professional in a clinic or doctor's office. However, your doctor may teach you how to inject yourself. Follow all instructions. Talk to your pediatrician regarding the use of this medicine in children. Special care may be needed. Overdosage: If you think you have taken too much of this medicine contact a poison control center or emergency room at once. NOTE: This medicine is only for you. Do not share this medicine with others. What if I miss a dose? If you are given your dose at a clinic or doctor's office, call to reschedule your appointment. If you give your own injections and you miss a dose, take it as soon as you can. If it is almost time for your next dose, take only that dose. Do not take double or extra doses. What may interact with this medicine?  colchicine  heavy alcohol intake This list may not describe all  possible interactions. Give your health care provider a list of all the medicines, herbs, non-prescription drugs, or dietary supplements you use. Also tell them if you smoke, drink alcohol, or use illegal drugs. Some items may interact with your medicine. What should I watch for while using this medicine? Visit your doctor or health care professional regularly. You may need blood work done while you are taking this medicine. You may need to follow a special diet. Talk to your doctor. Limit your alcohol intake and avoid smoking to get the best benefit. What side effects may I notice from receiving this medicine? Side effects that you should report to your doctor or health care professional as soon as possible:  allergic reactions like skin rash, itching or hives, swelling of the face, lips, or tongue  blue tint to skin  chest tightness, pain  difficulty breathing, wheezing  dizziness  red, swollen painful area on the leg Side effects that usually do not require medical attention (report to your doctor or health care professional if they continue or are bothersome):  diarrhea  headache This list may not describe all possible side effects. Call your doctor for medical advice about side effects. You may report side effects to FDA at 1-800-FDA-1088. Where should I keep my medicine? Keep out of the reach of children. Store at room temperature between 15 and 30 degrees C (59 and 85 degrees F). Protect from light. Throw away any unused medicine after the expiration date. NOTE: This sheet is a summary. It may not cover  all possible information. If you have questions about this medicine, talk to your doctor, pharmacist, or health care provider.  2021 Elsevier/Gold Standard (2007-09-28 22:10:20)  Denosumab injection What is this medicine? DENOSUMAB (den oh sue mab) slows bone breakdown. Prolia is used to treat osteoporosis in women after menopause and in men, and in people who are taking  corticosteroids for 6 months or more. Xgeva is used to treat a high calcium level due to cancer and to prevent bone fractures and other bone problems caused by multiple myeloma or cancer bone metastases. Xgeva is also used to treat giant cell tumor of the bone. This medicine may be used for other purposes; ask your health care provider or pharmacist if you have questions. COMMON BRAND NAME(S): Prolia, XGEVA What should I tell my health care provider before I take this medicine? They need to know if you have any of these conditions:  dental disease  having surgery or tooth extraction  infection  kidney disease  low levels of calcium or Vitamin D in the blood  malnutrition  on hemodialysis  skin conditions or sensitivity  thyroid or parathyroid disease  an unusual reaction to denosumab, other medicines, foods, dyes, or preservatives  pregnant or trying to get pregnant  breast-feeding How should I use this medicine? This medicine is for injection under the skin. It is given by a health care professional in a hospital or clinic setting. A special MedGuide will be given to you before each treatment. Be sure to read this information carefully each time. For Prolia, talk to your pediatrician regarding the use of this medicine in children. Special care may be needed. For Xgeva, talk to your pediatrician regarding the use of this medicine in children. While this drug may be prescribed for children as young as 13 years for selected conditions, precautions do apply. Overdosage: If you think you have taken too much of this medicine contact a poison control center or emergency room at once. NOTE: This medicine is only for you. Do not share this medicine with others. What if I miss a dose? It is important not to miss your dose. Call your doctor or health care professional if you are unable to keep an appointment. What may interact with this medicine? Do not take this medicine with any of the  following medications:  other medicines containing denosumab This medicine may also interact with the following medications:  medicines that lower your chance of fighting infection  steroid medicines like prednisone or cortisone This list may not describe all possible interactions. Give your health care provider a list of all the medicines, herbs, non-prescription drugs, or dietary supplements you use. Also tell them if you smoke, drink alcohol, or use illegal drugs. Some items may interact with your medicine. What should I watch for while using this medicine? Visit your doctor or health care professional for regular checks on your progress. Your doctor or health care professional may order blood tests and other tests to see how you are doing. Call your doctor or health care professional for advice if you get a fever, chills or sore throat, or other symptoms of a cold or flu. Do not treat yourself. This drug may decrease your body's ability to fight infection. Try to avoid being around people who are sick. You should make sure you get enough calcium and vitamin D while you are taking this medicine, unless your doctor tells you not to. Discuss the foods you eat and the vitamins you take with   your health care professional. See your dentist regularly. Brush and floss your teeth as directed. Before you have any dental work done, tell your dentist you are receiving this medicine. Do not become pregnant while taking this medicine or for 5 months after stopping it. Talk with your doctor or health care professional about your birth control options while taking this medicine. Women should inform their doctor if they wish to become pregnant or think they might be pregnant. There is a potential for serious side effects to an unborn child. Talk to your health care professional or pharmacist for more information. What side effects may I notice from receiving this medicine? Side effects that you should report to  your doctor or health care professional as soon as possible:  allergic reactions like skin rash, itching or hives, swelling of the face, lips, or tongue  bone pain  breathing problems  dizziness  jaw pain, especially after dental work  redness, blistering, peeling of the skin  signs and symptoms of infection like fever or chills; cough; sore throat; pain or trouble passing urine  signs of low calcium like fast heartbeat, muscle cramps or muscle pain; pain, tingling, numbness in the hands or feet; seizures  unusual bleeding or bruising  unusually weak or tired Side effects that usually do not require medical attention (report to your doctor or health care professional if they continue or are bothersome):  constipation  diarrhea  headache  joint pain  loss of appetite  muscle pain  runny nose  tiredness  upset stomach This list may not describe all possible side effects. Call your doctor for medical advice about side effects. You may report side effects to FDA at 1-800-FDA-1088. Where should I keep my medicine? This medicine is only given in a clinic, doctor's office, or other health care setting and will not be stored at home. NOTE: This sheet is a summary. It may not cover all possible information. If you have questions about this medicine, talk to your doctor, pharmacist, or health care provider.  2021 Elsevier/Gold Standard (2017-10-24 16:10:44)  

## 2020-09-19 NOTE — Progress Notes (Signed)
This patient returns to my office for at risk foot care.  This patient requires this care by a professional since this patient will be at risk due to having diabetic neuropathy  and coagulation defect.  Patient is taking eliquis. This patient is unable to cut nails himself since the patient cannot reach his nails.These nails are painful walking and wearing shoes.  Patient says the callus on his right forefoot has become painful when walking.  This patient presents for at risk foot care today.  General Appearance  Alert, conversant and in no acute stress.  Vascular  Dorsalis pedis and posterior tibial  pulses are palpable  bilaterally.  Capillary return is within normal limits  bilaterally. Temperature is within normal limits  bilaterally.  Neurologic  Senn-Weinstein monofilament wire test within normal limits  Left foot.  LOPS right foot is absent.  . Muscle power within normal limits bilaterally.  Nails Thick disfigured discolored nails with subungual debris  from hallux to fifth toes bilaterally. No evidence of bacterial infection or drainage bilaterally.  Orthopedic  No limitations of motion  feet .  No crepitus or effusions noted.  No bony pathology or digital deformities noted.  Plantar flexed fifth metatarsal right foot.  Skin  normotropic skin with noted bilaterally.  No signs of infections or ulcers noted.   Symptomatic porokeratosis  Right foot.  Onychomycosis  Pain in right toes  Pain in left toes  Porokeratosis sub 5th right  Consent was obtained for treatment procedures.   Mechanical debridement of nails 1-5  bilaterally performed with a nail nipper.  Filed with dremel without incident. Debride with # 15 blade.  Padding dispensed.  Consider surgtery for fifth met resection.   Return office visit   12 weeks.                  Told patient to return for periodic foot care and evaluation due to potential at risk complications.   Gardiner Barefoot DPM

## 2020-09-19 NOTE — Telephone Encounter (Signed)
Appointments scheduled patient has my chart access per 3/22 los

## 2020-09-25 ENCOUNTER — Ambulatory Visit (INDEPENDENT_AMBULATORY_CARE_PROVIDER_SITE_OTHER): Payer: Medicare Other | Admitting: Student

## 2020-09-25 ENCOUNTER — Encounter: Payer: Self-pay | Admitting: Student

## 2020-09-25 ENCOUNTER — Other Ambulatory Visit: Payer: Self-pay

## 2020-09-25 VITALS — BP 130/78 | HR 83 | Ht 72.0 in | Wt 250.4 lb

## 2020-09-25 DIAGNOSIS — I442 Atrioventricular block, complete: Secondary | ICD-10-CM

## 2020-09-25 DIAGNOSIS — I25810 Atherosclerosis of coronary artery bypass graft(s) without angina pectoris: Secondary | ICD-10-CM

## 2020-09-25 DIAGNOSIS — I480 Paroxysmal atrial fibrillation: Secondary | ICD-10-CM | POA: Diagnosis not present

## 2020-09-25 DIAGNOSIS — I2510 Atherosclerotic heart disease of native coronary artery without angina pectoris: Secondary | ICD-10-CM | POA: Diagnosis not present

## 2020-09-25 DIAGNOSIS — Z952 Presence of prosthetic heart valve: Secondary | ICD-10-CM | POA: Diagnosis not present

## 2020-09-25 DIAGNOSIS — I10 Essential (primary) hypertension: Secondary | ICD-10-CM

## 2020-09-25 LAB — CUP PACEART INCLINIC DEVICE CHECK
Battery Remaining Longevity: 27 mo
Battery Voltage: 2.92 V
Brady Statistic AP VP Percent: 14.13 %
Brady Statistic AP VS Percent: 0 %
Brady Statistic AS VP Percent: 85.44 %
Brady Statistic AS VS Percent: 0.12 %
Brady Statistic RA Percent Paced: 4.54 %
Brady Statistic RV Percent Paced: 95.31 %
Date Time Interrogation Session: 20220328141200
Implantable Lead Implant Date: 20190313
Implantable Lead Implant Date: 20190313
Implantable Lead Location: 753859
Implantable Lead Location: 753860
Implantable Lead Model: 3830
Implantable Lead Model: 5076
Implantable Pulse Generator Implant Date: 20190313
Lead Channel Impedance Value: 323 Ohm
Lead Channel Impedance Value: 342 Ohm
Lead Channel Impedance Value: 399 Ohm
Lead Channel Impedance Value: 418 Ohm
Lead Channel Pacing Threshold Amplitude: 0.875 V
Lead Channel Pacing Threshold Amplitude: 0.875 V
Lead Channel Pacing Threshold Pulse Width: 0.4 ms
Lead Channel Pacing Threshold Pulse Width: 0.4 ms
Lead Channel Sensing Intrinsic Amplitude: 1.625 mV
Lead Channel Sensing Intrinsic Amplitude: 2.25 mV
Lead Channel Sensing Intrinsic Amplitude: 2.5 mV
Lead Channel Sensing Intrinsic Amplitude: 6.125 mV
Lead Channel Setting Pacing Amplitude: 1.75 V
Lead Channel Setting Pacing Amplitude: 2.5 V
Lead Channel Setting Pacing Pulse Width: 1 ms
Lead Channel Setting Sensing Sensitivity: 1.2 mV

## 2020-09-25 NOTE — Patient Instructions (Signed)
Medication Instructions:  Your physician recommends that you continue on your current medications as directed. Please refer to the Current Medication list given to you today.  *If you need a refill on your cardiac medications before your next appointment, please call your pharmacy*   Lab Work: None If you have labs (blood work) drawn today and your tests are completely normal, you will receive your results only by: [name] MyChart Message (if you have MyChart) OR . A paper copy in the mail If you have any lab test that is abnormal or we need to change your treatment, we will call you to review the results.  Follow-Up: At [facility], you and your health needs are our priority.  As part of our continuing mission to provide you with exceptional heart care, we have created designated Provider Care Teams.  These Care Teams include your primary Cardiologist (physician) and Advanced Practice Providers (APPs -  Physician Assistants and Nurse Practitioners) who all work together to provide you with the care you need, when you need it.  Your next appointment:   6 month(s)  The format for your next appointment:   In Person  Provider:   You may see [name], MD or one of the following Advanced Practice Providers on your designated Care Team:    Chanetta Marshall, NP  Tommye Standard, PA-C  [name] "[facility] [place] [facility]" [place], [place]

## 2020-09-25 NOTE — Progress Notes (Signed)
Electrophysiology Office Note Date: 09/25/2020  ID:  Richard [name], DOB 02/27/1941, MRN [id number]  PCP: [name], DO Primary Cardiologist: Minus Breeding, MD Electrophysiologist: Dr. Caryl Comes  CC: Pacemaker follow-up  Richard [name] is a 80 y.o. male seen today for [name], MD for routine electrophysiology followup.  His last visit was via telemedicine in the setting of Covid-19 pandemic. He reports that since that visit, he has been doing very well. He denies chest pain, palpitations, dyspnea, PND, orthopnea, nausea, vomiting, dizziness, syncope, edema, weight gain, or early satiety. He is walking 1/2 mile per day and has increased his pace from 31 min to 16 min. He plays golf 3 times per week. He has been working on weight loss and has lost 12 lbs with healthy diet and exercise. Drinking more water and stopped drinking diet soda. States his FBG have improved since making these changes.   Lives alone, sister checks on him morning and night by telephone, lives 20 min away.   Device History: Medtronic Dual Chamber PPM implanted March 2019 for complete heart block s/p TAVR  Past Medical History:  Diagnosis Date  . Anemia   . Arthritis    hips  . Axillary adenopathy 02/25/2017  . Bradycardia    a. holter monitor has demonstrated HRs in 30s and [place]   . CAD (coronary artery disease)    a. s/p CABG 2001  b.  07/28/2017 cath:   Severe three-vessel native CAD with total occlusion of LAD, ramus intermedius, first OM and RCA, patent RIMA to PDA, LIMA to LAD, sequential SVG to ramus intermedius and first OM.    [name] Chronic lower back pain   . Diffuse large B cell lymphoma ([place])   . Diverticulosis   . Esophageal stricture   . GERD (gastroesophageal reflux disease)   . History of gout   . HTN (hypertension)   . Mixed hyperlipidemia   . OSA on CPAP    with 2L O2 at night  . Pancytopenia ([place])    a. related to chemo therapy for B cell lymphoma  . Peptic stricture of  esophagus   . Presence of permanent cardiac pacemaker    sees Dr. Valaria Good pacemaker  . Severe aortic stenosis   . Spinal stenosis   . Squamous cell carcinoma of skin 03/22/2009   Right mandible. SCCis, hypertrophic.   . Type II diabetes mellitus ([place])   . Wears dentures    partial upper   Past Surgical History:  Procedure Laterality Date  . APPENDECTOMY  ~ 1952  . BACK SURGERY    . CATARACT EXTRACTION W/PHACO Left 11/06/2016   Procedure: CATARACT EXTRACTION PHACO AND INTRAOCULAR LENS PLACEMENT (IOC);  Surgeon: Estill Cotta, MD;  Location: [facility];  Service: Ophthalmology;  Laterality: Left;  Lot # [id number] H [place]: 01:09.4 AP%:25.2 CDE: 30.64  . CATARACT EXTRACTION W/PHACO Right 12/04/2016   Procedure: CATARACT EXTRACTION PHACO AND INTRAOCULAR LENS PLACEMENT (IOC);  Surgeon: Estill Cotta, MD;  Location: [facility];  Service: Ophthalmology;  Laterality: Right;  [place] 1:25.9 AP% 24.1 CDE 39.10 Fluid Pack lot # [id number]  . COLONOSCOPY W/ BIOPSIES AND POLYPECTOMY  2013  . CORONARY ANGIOPLASTY  1993  . CORONARY ANGIOPLASTY WITH STENT PLACEMENT  05/1997   "1"  . CORONARY ARTERY BYPASS GRAFT  03/2000   "CABG X5"  . ECTROPION REPAIR Right 09/01/2018   Procedure: REPAIR OF ECTROPION BILATERAL upper and lower;  Surgeon: Karle Starch, MD;  Location: [place];  Service: Ophthalmology;  Laterality: Right;  Diabetic - oral meds sleep apnea  . ESOPHAGEAL DILATION  X 3-4   Dr. Lyla Son; "last one was in the 1990's"  . ESOPHAGOGASTRODUODENOSCOPY     multiple  . FLEXIBLE SIGMOIDOSCOPY     multiple  . HYDRADENITIS EXCISION Left 02/25/2017   Procedure: EXCISION DEEP LEFT AXILLARY LYMPH NODE;  Surgeon: Fanny Skates, MD;  Location: [place];  Service: General;  Laterality: Left;  . INTRAOPERATIVE TRANSTHORACIC ECHOCARDIOGRAM N/A 09/09/2017   Procedure: INTRAOPERATIVE TRANSTHORACIC ECHOCARDIOGRAM;  Surgeon: Burnell Blanks, MD;  Location: [place];  Service: Open Heart  Surgery;  Laterality: N/A;  . KNEE ARTHROSCOPY Left 2011   meniscus repair  . LEFT HEART CATHETERIZATION WITH CORONARY/GRAFT ANGIOGRAM N/A 03/16/2014   Procedure: LEFT HEART CATHETERIZATION WITH Beatrix Fetters;  Surgeon: Burnell Blanks, MD;  Location: [facility] CATH [facility];  Service: Cardiovascular;  Laterality: N/A;  . LUMBAR LAMINECTOMY/DECOMPRESSION MICRODISCECTOMY Right 06/17/2013   Procedure: LUMBAR LAMINECTOMY MICRODISCECTOMY L4-L5 RIGHT EXCISION OF SYNOVIAL CYST RIGHT   (1 LEVEL) RIGHT PARTIAL FACETECTOMY;  Surgeon: Tobi Bastos, MD;  Location: [facility];  Service: Orthopedics;  Laterality: Right;  . MYELOGRAM  04/06/13   lumbar, Dr Gladstone Lighter  . ORBITAL LESION EXCISION Right 09/01/2018   Procedure: ORBITOTOMY WITHOUT BONE FLAP WITH REMOVAL OF LESION RIGHT;  Surgeon: Karle Starch, MD;  Location: [place];  Service: Ophthalmology;  Laterality: Right;  . PACEMAKER IMPLANT N/A 09/10/2017   Procedure: PACEMAKER IMPLANT;  Surgeon: Deboraha Sprang, MD;  Location: [place] CV LAB;  Service: Cardiovascular;  Laterality: N/A;  . PANENDOSCOPY    . PORTACATH PLACEMENT N/A 03/06/2017   Procedure: INSERTION PORT-A-CATH AND ASPIRATE SEROMA LEFT AXILLA;  Surgeon: Fanny Skates, MD;  Location: [place];  Service: General;  Laterality: N/A;  . RIGHT/LEFT HEART CATH AND CORONARY/GRAFT ANGIOGRAPHY N/A 07/28/2017   Procedure: RIGHT/LEFT HEART CATH AND CORONARY/GRAFT ANGIOGRAPHY;  Surgeon: Sherren Mocha, MD;  Location: [place] [facility];  Service: Cardiovascular;  Laterality: N/A;  . SHOULDER SURGERY Right 08/2010   screws placed; "tendons tore off"  . SKIN CANCER EXCISION Right    "neck"  . TONSILLECTOMY  ~ 1954  . TOTAL KNEE ARTHROPLASTY Left 06/29/2019   Procedure: TOTAL KNEE ARTHROPLASTY;  Surgeon: Paralee Cancel, MD;  Location: [facility];  Service: Orthopedics;  Laterality: Left;  70 mins  . TRANSCATHETER AORTIC VALVE REPLACEMENT, TRANSFEMORAL N/A 09/09/2017   Procedure: TRANSCATHETER  AORTIC VALVE REPLACEMENT, TRANSFEMORAL;  Surgeon: Burnell Blanks, MD;  Location: [place];  Service: Open Heart Surgery;  Laterality: N/A;  . UPPER GI ENDOSCOPY  2013   Gastritis; Dr Carlean Purl  . VASECTOMY      Current Outpatient Medications  Medication Sig Dispense Refill  . allopurinol (ZYLOPRIM) 300 MG tablet Take 450 mg by mouth daily.    [name] aluminum hydroxide-magnesium carbonate (GAVISCON) 95-358 MG/15ML SUSP Take 15 mLs by mouth as needed for indigestion or heartburn.    [name] amLODipine (NORVASC) 5 MG tablet TAKE 1 TABLET BY MOUTH TWICE A DAY 180 tablet 3  . atorvastatin (LIPITOR) 80 MG tablet TAKE ONE TABLET BY MOUTH AT BEDTIME 90 tablet 3  . azelastine (ASTELIN) 0.1 % nasal spray Place 2 sprays into both nostrils at bedtime as needed for rhinitis or allergies. 30 mL 12  . ELIQUIS 2.5 MG TABS tablet TAKE 1 TABLET BY MOUTH TWICE A DAY 180 tablet 1  . esomeprazole (NEXIUM) 40 MG capsule Take 1 capsule (40 mg total) by mouth daily.  30 capsule 5  . ezetimibe (ZETIA) 10 MG tablet TAKE 1 TABLET BY MOUTH EVERY DAY 90 tablet 3  . fenofibrate 160 MG tablet Take 1 tablet (160 mg total) by mouth daily. 90 tablet 1  . folic acid (FOLVITE) 1 MG tablet TAKE 2 TABLETS BY MOUTH EVERY DAY 180 tablet 4  . FREESTYLE LITE test strip USE TO TEST BLOOD SUGAR ONCE A DAY. DX CODE: E11.9 100 strip 12  . glimepiride (AMARYL) 1 MG tablet TAKE 1 TABLET (1 MG TOTAL) BY MOUTH DAILY WITH BREAKFAST. 90 tablet 1  . hydrocortisone cream 1 % Apply 1 application topically 2 (two) times daily. 45 g 1  . Lancets (FREESTYLE) lancets USE TWICE A DAY TO CHECK BLOOD SUGAR. DX E11.9 100 each 6  . metFORMIN (GLUCOPHAGE) 1000 MG tablet TAKE 1 & 1/2 TABLETS BY MOUTH EVERY MORNING AND 1 TABLET IN THE EVENING. 225 tablet 1  . metoprolol tartrate (LOPRESSOR) 25 MG tablet TAKE ONE-HALF TABLET BY MOUTH TWICE DAILY 90 tablet 3  . nitroGLYCERIN (NITROSTAT) 0.4 MG SL tablet PLACE 1 TABLET (0.4 MG TOTAL) UNDER THE TONGUE EVERY 5 (FIVE)  MINUTES AS NEEDED FOR CHEST PAIN. 25 tablet 6  . polycarbophil (FIBERCON) 625 MG tablet Take 625 mg by mouth daily.    . potassium chloride SA (KLOR-CON M20) 20 MEQ tablet Take 2 tablets (40 mEq total) by mouth 2 (two) times daily. 360 tablet 3  . pregabalin (LYRICA) 150 MG capsule TAKE 1 CAPSULE BY MOUTH TWICE A DAY 180 capsule 1  . psyllium (METAMUCIL) 58.6 % powder Take 1 packet by mouth daily as needed (constipation).     . torsemide (DEMADEX) 20 MG tablet Take 40 mg by mouth daily.    [name] VASCEPA 1 g capsule TAKE 2 CAPSULES BY MOUTH TWICE A DAY 120 capsule 5   No current facility-administered medications for this visit.   Facility-Administered Medications Ordered in Other Visits  Medication Dose Route Frequency Provider Last Rate Last Admin  . sodium chloride flush (NS) 0.9 % injection 10 mL  10 mL Intravenous PRN [name], MD   10 mL at 05/30/17 0920    Allergies:   Benazepril, Hctz [hydrochlorothiazide], Aspirin, and Lactose intolerance (gi)   Social History: Social History   Socioeconomic History  . Marital status: Divorced    Spouse name: Not on file  . Number of children: 2  . Years of education: college  . Highest education level: Not on file  Occupational History    Employer: RETIRED  Tobacco Use  . Smoking status: Never Smoker  . Smokeless tobacco: Never Used  Vaping Use  . Vaping Use: Never used  Substance and Sexual Activity  . Alcohol use: No    Alcohol/week: 0.0 standard drinks    Comment: "last drink was in 2012"( 03/15/2014)  . Drug use: No  . Sexual activity: Not Currently  Other Topics Concern  . Not on file  Social History Narrative   Divorced, lives with a roommate.   1 son one daughter   3-4 caffeinated beverages daily   Right-handed.   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   Social Determinants of Health    Financial Resource Strain: Low Risk   . Difficulty of Paying Living Expenses: Not hard at all  Food Insecurity: No Food Insecurity  . Worried About Charity fundraiser in the  Last Year: Never true  . Ran Out of Food in the Last Year: Never true  Transportation Needs: No Transportation Needs  . Lack of Transportation (Medical): No  . Lack of Transportation (Non-Medical): No  Physical Activity: Not on file  Stress: Not on file  Social Connections: Not on file  Intimate Partner Violence: Not on file    Family History: Family History  Problem Relation Age of Onset  . Stroke Father   . Hypertension Father   . Pancreatic cancer Mother   . Diabetes Maternal Grandmother   . Stroke Maternal Grandmother   . Heart attack Paternal Grandmother   . Colon cancer Neg Hx   . Esophageal cancer Neg Hx   . Rectal cancer Neg Hx   . Stomach cancer Neg Hx   . Ulcers Neg Hx      Review of Systems: All other systems reviewed and are otherwise negative except as noted above.  Physical Exam: BP 130/78   Pulse 83   Ht 6' (1.829 m)   Wt 250 lb 6.4 oz (113.6 kg)   SpO2 95%   BMI 33.96 kg/m    GEN- The patient is obese, well appearing, alert and oriented x 3 today.   HEENT: normocephalic, atraumatic; sclera clear, conjunctiva pink; hearing intact; oropharynx clear; neck supple  Lungs- Clear to ausculation bilaterally, normal work of breathing.  No wheezes, rales, rhonchi Heart- Regular rate and rhythm, no murmurs, rubs or gallops  GI- soft, non-tender, non-distended, bowel sounds present  Extremities- no clubbing or cyanosis. No edema MS- no significant deformity or atrophy Skin- warm and dry, no rash or lesion; PPM pocket well healed Psych- euthymic mood, full affect Neuro- strength and sensation are intact  PPM Interrogation- reviewed in detail today,  See PACEART report  EKG:  EKG is not ordered today.  Recent Labs: 09/19/2020: ALT 20; BUN 52; Creatinine 1.32; Hemoglobin 12.8;  Platelet Count 247; Potassium 4.2; Sodium 138   Wt Readings from Last 3 Encounters:  09/25/20 250 lb 6.4 oz (113.6 kg)  09/19/20 252 lb 1.6 oz (114.4 kg)  08/22/20 253 lb (114.8 kg)     Other studies Reviewed: Additional studies/ records that were reviewed today include: Previous EP office notes, Previous remote checks, Most recent labwork.   Assessment and Plan:  Aortic stenosis status post TAVR  He has no symptoms of DOE, dizziness, lightheadedness, chest discomfort, or syncope. Followed by gen cards.   Atrial Fibrillation permanent  His interrogation showed PAF. He remains on Eliquis with no concerns for bleeding issues.  On dose reduced Eliquisfor CHA2DS2VASC of at least 6.   Most recent Cr 1.3, though baseline 1.5-1.7. Age > 46. Would continue dose reduced for now; Repeat BMET in 4 weeks at Lipid visit, if remains < 1.5, need to consider dose increase, though his baseline has been > 1.5 for some time.  Intermittent CHB s/p Pacemaker -Medtronic His bundle  Stable interrogation today. See PaceART  CAD s/p CABG  He is very active and has no symptoms of angina. Is working with lipid clinic on reducing triglycerides. Sees Dr. Percival Spanish regularly.    OSA on CPAP  Non Hodgkins Lymphoma Diffuse  chemos Cx 2019  Completed chemotherapy, still has port. Management per hem/onc.  Anemia Pernicious  Stable. Management per hem/onc.   Leg Discomfort He reports his leg discomfort is consistent with chronic neuropathy and his symptoms are stable. He denies claudication and is walking for exercise on a daily basis without problems.  Current medicines are reviewed at length with the patient today.   The patient does not have concerns regarding his medicines.  The following changes were made today:  none  Disposition:   Follow up with Dr. Caryl Comes in 6 Months in person only  Signed, [name]  09/25/2020 1:13 PM  [address] [place] [address] [phone] (office) [phone] (fax)

## 2020-09-28 ENCOUNTER — Telehealth: Payer: Self-pay | Admitting: Student

## 2020-09-28 DIAGNOSIS — M7061 Trochanteric bursitis, right hip: Secondary | ICD-10-CM | POA: Diagnosis not present

## 2020-09-28 DIAGNOSIS — M7062 Trochanteric bursitis, left hip: Secondary | ICD-10-CM | POA: Diagnosis not present

## 2020-09-28 NOTE — Telephone Encounter (Signed)
Attempted to contact patient in reference to concerns and not feeling well since changes were made to his device. Patient had recent Device settings changed in clinic with Oda Kilts PA-C on 09/25/20. Left message for patient to send a transmission today. Unable to make contact with patient. Voicemail left with Device Clinic number [phone]. Waiting on return call/transmission. Will send to scheduling for possible office appointment with Oda Kilts PA-C.

## 2020-09-28 NOTE — Telephone Encounter (Signed)
His HIS bundle lead was still at chronic settings at 3.5V @ 1.0 ms. I lowered it to 2.5V @ 1.2ms and this was still > 2x safety margin.  I made no other programming changes.

## 2020-09-28 NOTE — Telephone Encounter (Signed)
    Pt said since his appt with Jonni Sanger, he made an adjustment on his device and since then he's been feeling weak. He doesn't have any energy and would like to know what he need to do

## 2020-10-02 ENCOUNTER — Other Ambulatory Visit: Payer: Self-pay

## 2020-10-02 NOTE — Telephone Encounter (Signed)
Received refill request for eliquis 2.5mg  but pt qualifies for 5mg  will route to pharmd pool. 60m, 113.6kg, scr 1.32 09/19/20, lovw/tillery 09/25/20

## 2020-10-03 ENCOUNTER — Ambulatory Visit: Payer: Medicare Other | Admitting: Dermatology

## 2020-10-04 ENCOUNTER — Other Ambulatory Visit: Payer: Self-pay

## 2020-10-04 MED ORDER — APIXABAN 2.5 MG PO TABS: 2.5000 mg | ORAL_TABLET | Freq: Two times a day (BID) | ORAL | 1 refills | Status: DC

## 2020-10-04 NOTE — Telephone Encounter (Signed)
29m, 113.6kg, scr 1.32 09/19/20, lovw/tillery 09/25/20 pt requesting efill f eliquis 2.5mg  when they qualify for 5mg  will route to pharmd pool refill to be send to [facility]

## 2020-10-04 NOTE — Progress Notes (Signed)
Cardiology Office Note Date:  10/05/2020  Patient ID:  Richard [name], DOB 08/10/1940, MRN [id number] PCP:  [name], DO  Cardiologist:  Dr. Percival Spanish Electrophysiologist: Dr. Caryl Comes    Chief Complaint: not feeling well  History of Present Illness: Richard [name] is a 80 y.o. male with history of CAD (CABG 2001), HTN, HLD, OSA w/ CPAP, DM, VHD w/AS s/p TAVR, AFib, CHB w/PPM, NHL, anemia, ascending Ao aneurysm, CKD (III)  He comes in today to e seen for Dr. Caryl Comes, last seen by him via tele health Jun 2020, doing well, no changes were made.  He saw Dr. Percival Spanish May 2021, golfing, doing well, had a trip/fall, no syncope.  Planned for an echo to evaluate his ascending aO in effort to avoid contrast given his CKD   Most recently saw A Tiller, PA 09/25/20, doing well, golfing, walking with good exertional capacity.   RV output reduced from 3.5 > 2.5  Pt called with c/o since his visit and programming changes not feeling as well.  TODAY He reports that he had been feeling quite good with good exertional capacity until his last pacer check./ By the time he got home he felt like something was different and when he went toi do his usual walk on the treadmill clearly had less energy and could not do his usual pace. Richard [name] the next few days the same. No CP, palpitations or clear cardiac awareness. No near syncope or syncope. 2 days ago he was walking down the stairs and felt a little lightheaded and winded, sat for a few minutes, checked his O2 sat was only 91%, (usually 94) his BP a bit high, can not recall a measurement or HR Yesterday afternoon he golfed and felt pretty good, and so far today feels OK, but really feels like the adjustment to his pacer has changed something.   Device information MDT dual chamber PPM implanted 08/2017    Past Medical History:  Diagnosis Date  . Anemia   . Arthritis    hips  . Axillary adenopathy 02/25/2017  . Bradycardia    a. holter monitor  has demonstrated HRs in 30s and [place]   . CAD (coronary artery disease)    a. s/p CABG 2001  b.  07/28/2017 cath:   Severe three-vessel native CAD with total occlusion of LAD, ramus intermedius, first OM and RCA, patent RIMA to PDA, LIMA to LAD, sequential SVG to ramus intermedius and first OM.    [name] Chronic lower back pain   . Diffuse large B cell lymphoma ([place])   . Diverticulosis   . Esophageal stricture   . GERD (gastroesophageal reflux disease)   . History of gout   . HTN (hypertension)   . Mixed hyperlipidemia   . OSA on CPAP    with 2L O2 at night  . Pancytopenia ([place])    a. related to chemo therapy for B cell lymphoma  . Peptic stricture of esophagus   . Presence of permanent cardiac pacemaker    sees Dr. Valaria Good pacemaker  . Severe aortic stenosis   . Spinal stenosis   . Squamous cell carcinoma of skin 03/22/2009   Right mandible. SCCis, hypertrophic.   . Type II diabetes mellitus ([place])   . Wears dentures    partial upper    Past Surgical History:  Procedure Laterality Date  . APPENDECTOMY  ~ 1952  . BACK SURGERY    . CATARACT EXTRACTION W/PHACO Left 11/06/2016   Procedure:  CATARACT EXTRACTION PHACO AND INTRAOCULAR LENS PLACEMENT (IOC);  Surgeon: Estill Cotta, MD;  Location: [facility];  Service: Ophthalmology;  Laterality: Left;  Lot # [id number] H [place]: 01:09.4 AP%:25.2 CDE: 30.64  . CATARACT EXTRACTION W/PHACO Right 12/04/2016   Procedure: CATARACT EXTRACTION PHACO AND INTRAOCULAR LENS PLACEMENT (IOC);  Surgeon: Estill Cotta, MD;  Location: [facility];  Service: Ophthalmology;  Laterality: Right;  [place] 1:25.9 AP% 24.1 CDE 39.10 Fluid Pack lot # [id number]  . COLONOSCOPY W/ BIOPSIES AND POLYPECTOMY  2013  . CORONARY ANGIOPLASTY  1993  . CORONARY ANGIOPLASTY WITH STENT PLACEMENT  05/1997   "1"  . CORONARY ARTERY BYPASS GRAFT  03/2000   "CABG X5"  . ECTROPION REPAIR Right 09/01/2018   Procedure: REPAIR OF ECTROPION BILATERAL upper and lower;  Surgeon: Karle Starch, MD;  Location: [place];  Service: Ophthalmology;  Laterality: Right;  Diabetic - oral meds sleep apnea  . ESOPHAGEAL DILATION  X 3-4   Dr. Lyla Son; "last one was in the 1990's"  . ESOPHAGOGASTRODUODENOSCOPY     multiple  . FLEXIBLE SIGMOIDOSCOPY     multiple  . HYDRADENITIS EXCISION Left 02/25/2017   Procedure: EXCISION DEEP LEFT AXILLARY LYMPH NODE;  Surgeon: Fanny Skates, MD;  Location: [place];  Service: General;  Laterality: Left;  . INTRAOPERATIVE TRANSTHORACIC ECHOCARDIOGRAM N/A 09/09/2017   Procedure: INTRAOPERATIVE TRANSTHORACIC ECHOCARDIOGRAM;  Surgeon: Burnell Blanks, MD;  Location: [place];  Service: Open Heart Surgery;  Laterality: N/A;  . KNEE ARTHROSCOPY Left 2011   meniscus repair  . LEFT HEART CATHETERIZATION WITH CORONARY/GRAFT ANGIOGRAM N/A 03/16/2014   Procedure: LEFT HEART CATHETERIZATION WITH Beatrix Fetters;  Surgeon: Burnell Blanks, MD;  Location: [facility] C[facility];  Service: Cardiovascular;  Laterality: N/A;  . LUMBAR LAMINECTOMY/DECOMPRESSION MICRODISCECTOMY Right 06/17/2013   Procedure: LUMBAR LAMINECTOMY MICRODISCECTOMY L4-L5 RIGHT EXCISION OF SYNOVIAL CYST RIGHT   (1 LEVEL) RIGHT PARTIAL FACETECTOMY;  Surgeon: Tobi Bastos, MD;  Location: [facility];  Service: Orthopedics;  Laterality: Right;  . MYELOGRAM  04/06/13   lumbar, Dr Gladstone Lighter  . ORBITAL LESION EXCISION Right 09/01/2018   Procedure: ORBITOTOMY WITHOUT BONE FLAP WITH REMOVAL OF LESION RIGHT;  Surgeon: Karle Starch, MD;  Location: [place];  Service: Ophthalmology;  Laterality: Right;  . PACEMAKER IMPLANT N/A 09/10/2017   Procedure: PACEMAKER IMPLANT;  Surgeon: Deboraha Sprang, MD;  Location: [place] CV LAB;  Service: Cardiovascular;  Laterality: N/A;  . PANENDOSCOPY    . PORTACATH PLACEMENT N/A 03/06/2017   Procedure: INSERTION PORT-A-CATH AND ASPIRATE SEROMA LEFT AXILLA;  Surgeon: Fanny Skates, MD;  Location: [place];  Service: General;  Laterality: N/A;   . RIGHT/LEFT HEART CATH AND CORONARY/GRAFT ANGIOGRAPHY N/A 07/28/2017   Procedure: RIGHT/LEFT HEART CATH AND CORONARY/GRAFT ANGIOGRAPHY;  Surgeon: Sherren Mocha, MD;  Location: [place] CV LAB;  Service: Cardiovascular;  Laterality: N/A;  . SHOULDER SURGERY Right 08/2010   screws placed; "tendons tore off"  . SKIN CANCER EXCISION Right    "neck"  . TONSILLECTOMY  ~ 1954  . TOTAL KNEE ARTHROPLASTY Left 06/29/2019   Procedure: TOTAL KNEE ARTHROPLASTY;  Surgeon: Paralee Cancel, MD;  Location: [facility];  Service: Orthopedics;  Laterality: Left;  70 mins  . TRANSCATHETER AORTIC VALVE REPLACEMENT, TRANSFEMORAL N/A 09/09/2017   Procedure: TRANSCATHETER AORTIC VALVE REPLACEMENT, TRANSFEMORAL;  Surgeon: Burnell Blanks, MD;  Location: [place];  Service: Open Heart Surgery;  Laterality: N/A;  . UPPER GI ENDOSCOPY  2013   Gastritis; Dr Carlean Purl  . VASECTOMY  Current Outpatient Medications  Medication Sig Dispense Refill  . allopurinol (ZYLOPRIM) 300 MG tablet Take 450 mg by mouth daily.    [name] aluminum hydroxide-magnesium carbonate (GAVISCON) 95-358 MG/15ML SUSP Take 15 mLs by mouth as needed for indigestion or heartburn.    [name] amLODipine (NORVASC) 5 MG tablet TAKE 1 TABLET BY MOUTH TWICE A DAY 180 tablet 3  . apixaban (ELIQUIS) 2.5 MG TABS tablet Take 1 tablet (2.5 mg total) by mouth 2 (two) times daily. 180 tablet 1  . atorvastatin (LIPITOR) 80 MG tablet TAKE ONE TABLET BY MOUTH AT BEDTIME 90 tablet 3  . azelastine (ASTELIN) 0.1 % nasal spray Place 2 sprays into both nostrils at bedtime as needed for rhinitis or allergies. 30 mL 12  . esomeprazole (NEXIUM) 40 MG capsule Take 1 capsule (40 mg total) by mouth daily. 30 capsule 5  . ezetimibe (ZETIA) 10 MG tablet TAKE 1 TABLET BY MOUTH EVERY DAY 90 tablet 3  . fenofibrate 160 MG tablet Take 1 tablet (160 mg total) by mouth daily. 90 tablet 1  . folic acid (FOLVITE) 1 MG tablet TAKE 2 TABLETS BY MOUTH EVERY DAY 180 tablet 4  . FREESTYLE LITE  test strip USE TO TEST BLOOD SUGAR ONCE A DAY. DX CODE: E11.9 100 strip 12  . glimepiride (AMARYL) 1 MG tablet TAKE 1 TABLET (1 MG TOTAL) BY MOUTH DAILY WITH BREAKFAST. 90 tablet 1  . hydrocortisone cream 1 % Apply 1 application topically 2 (two) times daily. 45 g 1  . Lancets (FREESTYLE) lancets USE TWICE A DAY TO CHECK BLOOD SUGAR. DX E11.9 100 each 6  . metFORMIN (GLUCOPHAGE) 1000 MG tablet TAKE 1 & 1/2 TABLETS BY MOUTH EVERY MORNING AND 1 TABLET IN THE EVENING. 225 tablet 1  . metoprolol tartrate (LOPRESSOR) 25 MG tablet TAKE ONE-HALF TABLET BY MOUTH TWICE DAILY 90 tablet 3  . nitroGLYCERIN (NITROSTAT) 0.4 MG SL tablet PLACE 1 TABLET (0.4 MG TOTAL) UNDER THE TONGUE EVERY 5 (FIVE) MINUTES AS NEEDED FOR CHEST PAIN. 25 tablet 6  . polycarbophil (FIBERCON) 625 MG tablet Take 625 mg by mouth daily.    . potassium chloride SA (KLOR-CON M20) 20 MEQ tablet Take 2 tablets (40 mEq total) by mouth 2 (two) times daily. 360 tablet 3  . pregabalin (LYRICA) 150 MG capsule TAKE 1 CAPSULE BY MOUTH TWICE A DAY 180 capsule 1  . psyllium (METAMUCIL) 58.6 % powder Take 1 packet by mouth daily as needed (constipation).     . torsemide (DEMADEX) 20 MG tablet Take 40 mg by mouth daily.    [name] VASCEPA 1 g capsule TAKE 2 CAPSULES BY MOUTH TWICE A DAY 120 capsule 5   No current facility-administered medications for this visit.   Facility-Administered Medications Ordered in Other Visits  Medication Dose Route Frequency Provider Last Rate Last Admin  . sodium chloride flush (NS) 0.9 % injection 10 mL  10 mL Intravenous PRN [name], MD   10 mL at 05/30/17 0920    Allergies:   Benazepril, Hctz [hydrochlorothiazide], Aspirin, and Lactose intolerance (gi)   Social History:  The patient  reports that he has never smoked. He has never used smokeless tobacco. He reports that he does not drink alcohol and does not use drugs.   Family History:  The patient's family history includes Diabetes in his maternal  grandmother; Heart attack in his paternal grandmother; Hypertension in his father; Pancreatic cancer in his mother; Stroke in his father and maternal grandmother.  ROS:  Please see the history of present illness.    All other systems are reviewed and otherwise negative.   PHYSICAL EXAM:  VS:  BP 138/62   Pulse 79   Ht 6' (1.829 m)   Wt 248 lb (112.5 kg)   SpO2 92%   BMI 33.63 kg/m  BMI: Body mass index is 33.63 kg/m. Well nourished, well developed, in no acute distress HEENT: normocephalic, atraumatic Neck: no JVD, carotid bruits or masses Cardiac:  RRR; soft SM, no rubs, or gallops Lungs:  CTA b/l, no wheezing, rhonchi or rales Abd: soft, nontender, obese MS: no deformity or atrophy Ext: trace edema Skin: warm and dry, no rash Neuro:  No gross deficits appreciated Psych: euthymic mood, full affect  PPM site is stable, no tethering or discomfort   EKG:  Done today and reviewed by myself shows  #1 (at 2.5V), SR/V pacing QRS 170ms #2 (st 3.5V) SR/VP, QRS 158ms, R wave noted V1 (though perhaps lead placement difference)  Device interrogation done today and reviewed by myself:  Battery and lead measurements are good One NSVT With RV thresholds starting at 5v, I could not appreciate any clear transition from selective to non-selective pacing His RV outputs were programmed back to 3.5V This changed battery estimate from 3 years to 2.2 years.   12/23/2019: TTE IMPRESSIONS  1. Left ventricular ejection fraction, by estimation, is 60 to 65%. The  left ventricle has normal function. The left ventricle has no regional  wall motion abnormalities. Left ventricular diastolic parameters are  consistent with Grade I diastolic  dysfunction (impaired relaxation). Elevated left ventricular end-diastolic  pressure. The average left ventricular global longitudinal strain is -19.9  %. The global longitudinal strain is normal.  2. Right ventricular systolic function is normal. The right  ventricular  size is mildly enlarged. There is normal pulmonary artery systolic  pressure.  3. The mitral valve is normal in structure. Mild mitral valve  regurgitation. No evidence of mitral stenosis.  4. The aortic valve has been repaired/replaced. Aortic valve  regurgitation is not visualized. No aortic stenosis is present. There is a  26 mm Edwards Sapien prosthetic (TAVR) valve present in the aortic  position. Procedure Date: 09/09/17. Echo findings  are consistent with normal structure and function of the aortic valve  prosthesis. Aortic valve area, by VTI measures 2.75 cm. Aortic valve mean  gradient measures 15.0 mmHg. Aortic valve Vmax measures 2.57 m/s. DI 0.56.  AVA 2.62cm2.  5. Aortic dilatation noted. There is mild dilatation at the level of the  sinuses of Valsalva and of the ascending aorta measuring 42 mm and 68mm  respectively.  6. The inferior vena cava is normal in size with greater than 50%  respiratory variability, suggesting right atrial pressure of 3 mmHg.  7. Right atrial size was mildly dilated.   Comparison(s): 11/05/18 EF 50-55%. AV 16mmHg mean PG, 40mmHg peak PG.  Compared to echo 10/2018, no significant change. Normal functioning TAVR  with stable gradients (peak AVG 27 and mean AVG 34mmHg) and no  perivalvular AI.    07/28/2017: LHC 1.  Severe native three-vessel coronary artery disease with moderate distal left main stenosis, total occlusion of the LAD, total occlusion of the ramus intermedius, total occlusion of the first OM, and total occlusion of the RCA. 2.  Status post aortocoronary bypass surgery with continued patency of the RIMA to PDA, LIMA to LAD, and sequential saphenous vein graft to ramus intermedius and first OM branches of the circumflex. 3.  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  Severe calcific aortic stenosis with a mean transvalvular gradient of 51  mmHg    Recent Labs: 09/19/2020: ALT 20; BUN 52; Creatinine 1.32; Hemoglobin 12.8; Platelet Count 247; Potassium 4.2; Sodium 138  01/25/2020: LDL Chol Calc (NIH) 73 07/27/2020: Cholesterol 159; Direct LDL 47.0; HDL 29.60; Total CHOL/HDL Ratio 5; Triglycerides 839.0 Triglyceride is over 400; calculations on Lipids are invalid.   Estimated Creatinine Clearance: 55.9 mL/min (A) (by C-G formula based on SCr of 1.32 mg/dL (H)).   Wt Readings from Last 3 Encounters:  10/05/20 248 lb (112.5 kg)  09/25/20 250 lb 6.4 oz (113.6 kg)  09/19/20 252 lb 1.6 oz (114.4 kg)     Other studies reviewed: Additional studies/records reviewed today include: summarized above  ASSESSMENT AND PLAN:  1. PPM     As above  I discussed with the patient that by minimal change on his EKG was not convinced that the voltage change on his lead made the difference, that being said there seemed to be a clear correlation for the patient. ?? Perhaps at higher voltage there is a more selective capture of the HIS.  I discussed the difference voltage adjustment makes on his battery longevity, he understands and he would very much like to stay at prior setting  I also discussed perhaps a more intrinsic issue, ?anginal equivalent, though he really doesn't think so and would 1st like his pacer back to whee it was and see how he feels. If he has ongoing symptoms or not clearly back to his baseline he will reach out to Dr. Percival Spanish for further.  2. CAD     No CP or clear anginal symptoms     On statin, BB, no ASA w/Eliquis     C/w Dr. Percival Spanish  3. VHD, severe AS     S/p TAVR     Echo last summer looked OK     C/w Dr. Percival Spanish  4. Paroxysmal Afib      CHA2DS2Vasc is 5, on Eliquis, last creat was 1.32, though generally is >1.5     <0.1 % burden  Disposition: F/u with [facility] as usual, Dr. Percival Spanish as scheduled, sooner if needed/  Current medicines are reviewed at length with the patient today.  The patient did not have  any concerns regarding medicines.  Venetia Night, PA-C 10/05/2020 1:10 PM     [address] [place] [address] [phone] (office)  [phone] (fax)

## 2020-10-04 NOTE — Telephone Encounter (Signed)
Duplicate refill request - routing msg from other encounter to this one for documentation: SCr historically > 1.5 in the past. Note has already been added to pt's appt with Dr Debara Pickett in 1 month - ok to send in 1 month refill for current 2.5mg  dose but BMET will need to be rechecked when pt sees Dr Debara Pickett in a month to at SCr trend before changing dose.

## 2020-10-04 NOTE — Telephone Encounter (Signed)
SCr historically > 1.5 in the past. Note has already been added to pt's appt with Dr Debara Pickett in 1 month - ok to send in 1 month refill for current 2.5mg  dose but BMET will need to be rechecked when pt sees Dr Debara Pickett in a month to at SCr trend before changing dose.

## 2020-10-05 ENCOUNTER — Encounter: Payer: Self-pay | Admitting: Physician Assistant

## 2020-10-05 ENCOUNTER — Other Ambulatory Visit: Payer: Self-pay

## 2020-10-05 ENCOUNTER — Ambulatory Visit (INDEPENDENT_AMBULATORY_CARE_PROVIDER_SITE_OTHER): Payer: Medicare Other | Admitting: Physician Assistant

## 2020-10-05 VITALS — BP 138/62 | HR 79 | Ht 72.0 in | Wt 248.0 lb

## 2020-10-05 DIAGNOSIS — I442 Atrioventricular block, complete: Secondary | ICD-10-CM | POA: Diagnosis not present

## 2020-10-05 DIAGNOSIS — I480 Paroxysmal atrial fibrillation: Secondary | ICD-10-CM

## 2020-10-05 DIAGNOSIS — Z950 Presence of cardiac pacemaker: Secondary | ICD-10-CM | POA: Diagnosis not present

## 2020-10-05 DIAGNOSIS — I2510 Atherosclerotic heart disease of native coronary artery without angina pectoris: Secondary | ICD-10-CM | POA: Diagnosis not present

## 2020-10-05 DIAGNOSIS — Z952 Presence of prosthetic heart valve: Secondary | ICD-10-CM | POA: Diagnosis not present

## 2020-10-05 DIAGNOSIS — I25810 Atherosclerosis of coronary artery bypass graft(s) without angina pectoris: Secondary | ICD-10-CM | POA: Diagnosis not present

## 2020-10-05 LAB — CUP PACEART INCLINIC DEVICE CHECK
Battery Remaining Longevity: 34 mo
Battery Voltage: 2.94 V
Brady Statistic AP VP Percent: 10.3 %
Brady Statistic AP VS Percent: 0 %
Brady Statistic AS VP Percent: 89.45 %
Brady Statistic AS VS Percent: 0.25 %
Brady Statistic RA Percent Paced: 10.32 %
Brady Statistic RV Percent Paced: 99.75 %
Date Time Interrogation Session: 20220407131449
Implantable Lead Implant Date: 20190313
Implantable Lead Implant Date: 20190313
Implantable Lead Location: 753859
Implantable Lead Location: 753860
Implantable Lead Model: 3830
Implantable Lead Model: 5076
Implantable Pulse Generator Implant Date: 20190313
Lead Channel Impedance Value: 342 Ohm
Lead Channel Impedance Value: 342 Ohm
Lead Channel Impedance Value: 437 Ohm
Lead Channel Impedance Value: 437 Ohm
Lead Channel Pacing Threshold Amplitude: 0.875 V
Lead Channel Pacing Threshold Amplitude: 1.125 V
Lead Channel Pacing Threshold Pulse Width: 0.4 ms
Lead Channel Pacing Threshold Pulse Width: 0.4 ms
Lead Channel Sensing Intrinsic Amplitude: 1.5 mV
Lead Channel Sensing Intrinsic Amplitude: 1.875 mV
Lead Channel Sensing Intrinsic Amplitude: 2.125 mV
Lead Channel Sensing Intrinsic Amplitude: 6.125 mV
Lead Channel Setting Pacing Amplitude: 2 V
Lead Channel Setting Pacing Amplitude: 3.5 V
Lead Channel Setting Pacing Pulse Width: 1 ms
Lead Channel Setting Sensing Sensitivity: 1.2 mV

## 2020-10-05 NOTE — Patient Instructions (Signed)
Medication Instructions:   Your physician recommends that you continue on your current medications as directed. Please refer to the Current Medication list given to you today.  *If you need a refill on your cardiac medications before your next appointment, please call your pharmacy*   Lab Work: NONE ORDERED  TODAY   If you have labs (blood work) drawn today and your tests are completely normal, you will receive your results only by: . MyChart Message (if you have MyChart) OR . A paper copy in the mail If you have any lab test that is abnormal or we need to change your treatment, we will call you to review the results.   Testing/Procedures: NONE ORDERED  TODAY   Follow-Up: At [facility], you and your health needs are our priority.  As part of our continuing mission to provide you with exceptional heart care, we have created designated Provider Care Teams.  These Care Teams include your primary Cardiologist (physician) and Advanced Practice Providers (APPs -  Physician Assistants and Nurse Practitioners) who all work together to provide you with the care you need, when you need it.  We recommend signing up for the patient portal called "MyChart".  Sign up information is provided on this After Visit Summary.  MyChart is used to connect with patients for Virtual Visits (Telemedicine).  Patients are able to view lab/test results, encounter notes, upcoming appointments, etc.  Non-urgent messages can be sent to your provider as well.   To learn more about what you can do with MyChart, go to [email or web address].    Your next appointment:   1 year(s)  The format for your next appointment:   In Person  Provider:   Steven Klein, MD   Other Instructions   

## 2020-10-11 NOTE — Telephone Encounter (Signed)
Patient seen 10/05/20 by Charlcie Cradle PA.

## 2020-10-20 ENCOUNTER — Inpatient Hospital Stay: Payer: Medicare Other

## 2020-10-20 ENCOUNTER — Telehealth: Payer: Self-pay | Admitting: *Deleted

## 2020-10-20 ENCOUNTER — Encounter: Payer: Self-pay | Admitting: Family

## 2020-10-20 ENCOUNTER — Inpatient Hospital Stay (HOSPITAL_BASED_OUTPATIENT_CLINIC_OR_DEPARTMENT_OTHER): Payer: Medicare Other | Admitting: Family

## 2020-10-20 ENCOUNTER — Inpatient Hospital Stay: Payer: Medicare Other | Attending: Hematology & Oncology

## 2020-10-20 ENCOUNTER — Ambulatory Visit (HOSPITAL_BASED_OUTPATIENT_CLINIC_OR_DEPARTMENT_OTHER)
Admission: RE | Admit: 2020-10-20 | Discharge: 2020-10-20 | Disposition: A | Discharge status: Home / Self Care | Payer: Medicare Other | Source: Ambulatory Visit | Attending: Family | Admitting: Family

## 2020-10-20 ENCOUNTER — Other Ambulatory Visit: Payer: Self-pay

## 2020-10-20 VITALS — BP 128/58 | HR 73 | Temp 98.2°F | Resp 18 | Ht 72.0 in | Wt 238.1 lb

## 2020-10-20 DIAGNOSIS — D510 Vitamin B12 deficiency anemia due to intrinsic factor deficiency: Secondary | ICD-10-CM | POA: Insufficient documentation

## 2020-10-20 DIAGNOSIS — M7989 Other specified soft tissue disorders: Secondary | ICD-10-CM | POA: Diagnosis not present

## 2020-10-20 DIAGNOSIS — D500 Iron deficiency anemia secondary to blood loss (chronic): Secondary | ICD-10-CM | POA: Insufficient documentation

## 2020-10-20 DIAGNOSIS — I25810 Atherosclerosis of coronary artery bypass graft(s) without angina pectoris: Secondary | ICD-10-CM

## 2020-10-20 DIAGNOSIS — D519 Vitamin B12 deficiency anemia, unspecified: Secondary | ICD-10-CM | POA: Diagnosis not present

## 2020-10-20 DIAGNOSIS — C8332 Diffuse large B-cell lymphoma, intrathoracic lymph nodes: Secondary | ICD-10-CM

## 2020-10-20 DIAGNOSIS — Z886 Allergy status to analgesic agent status: Secondary | ICD-10-CM | POA: Insufficient documentation

## 2020-10-20 DIAGNOSIS — D508 Other iron deficiency anemias: Secondary | ICD-10-CM

## 2020-10-20 DIAGNOSIS — C8338 Diffuse large B-cell lymphoma, lymph nodes of multiple sites: Secondary | ICD-10-CM | POA: Insufficient documentation

## 2020-10-20 DIAGNOSIS — G629 Polyneuropathy, unspecified: Secondary | ICD-10-CM | POA: Insufficient documentation

## 2020-10-20 LAB — CBC WITH DIFFERENTIAL (CANCER CENTER ONLY)
Abs Immature Granulocytes: 0.08 10*3/uL — ABNORMAL HIGH (ref 0.00–0.07)
Basophils Absolute: 0.1 10*3/uL (ref 0.0–0.1)
Basophils Relative: 1 %
Eosinophils Absolute: 0.2 10*3/uL (ref 0.0–0.5)
Eosinophils Relative: 2 %
HCT: 37 % — ABNORMAL LOW (ref 39.0–52.0)
Hemoglobin: 12 g/dL — ABNORMAL LOW (ref 13.0–17.0)
Immature Granulocytes: 1 %
Lymphocytes Relative: 24 %
Lymphs Abs: 1.7 10*3/uL (ref 0.7–4.0)
MCH: 29 pg (ref 26.0–34.0)
MCHC: 32.4 g/dL (ref 30.0–36.0)
MCV: 89.4 fL (ref 80.0–100.0)
Monocytes Absolute: 0.5 10*3/uL (ref 0.1–1.0)
Monocytes Relative: 8 %
Neutro Abs: 4.5 10*3/uL (ref 1.7–7.7)
Neutrophils Relative %: 64 %
Platelet Count: 223 10*3/uL (ref 150–400)
RBC: 4.14 MIL/uL — ABNORMAL LOW (ref 4.22–5.81)
RDW: 16.7 % — ABNORMAL HIGH (ref 11.5–15.5)
WBC Count: 7 10*3/uL (ref 4.0–10.5)
nRBC: 0 % (ref 0.0–0.2)

## 2020-10-20 LAB — CMP (CANCER CENTER ONLY)
ALT: 15 U/L (ref 0–44)
AST: 15 U/L (ref 15–41)
Albumin: 4.6 g/dL (ref 3.5–5.0)
Alkaline Phosphatase: 62 U/L (ref 38–126)
Anion gap: 10 (ref 5–15)
BUN: 35 mg/dL — ABNORMAL HIGH (ref 8–23)
CO2: 26 mmol/L (ref 22–32)
Calcium: 10.6 mg/dL — ABNORMAL HIGH (ref 8.9–10.3)
Chloride: 102 mmol/L (ref 98–111)
Creatinine: 1.39 mg/dL — ABNORMAL HIGH (ref 0.61–1.24)
GFR, Estimated: 51 mL/min — ABNORMAL LOW (ref >60)
Glucose, Bld: 122 mg/dL — ABNORMAL HIGH (ref 70–99)
Potassium: 4.2 mmol/L (ref 3.5–5.1)
Sodium: 138 mmol/L (ref 135–145)
Total Bilirubin: 0.4 mg/dL (ref 0.3–1.2)
Total Protein: 7.1 g/dL (ref 6.5–8.1)

## 2020-10-20 LAB — RETICULOCYTES
Immature Retic Fract: 28.1 % — ABNORMAL HIGH (ref 2.3–15.9)
RBC.: 4.18 MIL/uL — ABNORMAL LOW (ref 4.22–5.81)
Retic Count, Absolute: 76.5 10*3/uL (ref 19.0–186.0)
Retic Ct Pct: 1.8 % (ref 0.4–3.1)

## 2020-10-20 LAB — IRON AND TIBC
Iron: 81 ug/dL (ref 42–163)
Saturation Ratios: 23 % (ref 20–55)
TIBC: 356 ug/dL (ref 202–409)
UIBC: 275 ug/dL (ref 117–376)

## 2020-10-20 LAB — VITAMIN B12: Vitamin B-12: 193 pg/mL (ref 180–914)

## 2020-10-20 LAB — FERRITIN: Ferritin: 1137 ng/mL — ABNORMAL HIGH (ref 24–336)

## 2020-10-20 MED ORDER — CYANOCOBALAMIN 1000 MCG/ML IJ SOLN
INTRAMUSCULAR | Status: AC
  Filled 2020-10-20: qty 1

## 2020-10-20 MED ORDER — CYANOCOBALAMIN 1000 MCG/ML IJ SOLN
1000.0000 ug | Freq: Once | INTRAMUSCULAR | Status: AC
  Administered 2020-10-20: 1000 ug via INTRAMUSCULAR

## 2020-10-20 NOTE — Telephone Encounter (Signed)
-----   Message from Eliezer Bottom, NP sent at 10/20/2020  1:23 PM EDT ----- No DVT!!! WOO HOO!!!!! Follow up with orthopedist!!!!    ----- Message ----- From: Interface, Rad Results In Sent: 10/20/2020   1:10 PM EDT To: Eliezer Bottom, NP

## 2020-10-20 NOTE — Telephone Encounter (Signed)
Notified pt of results. Pt verbalized understanding, no concerns at this time. 

## 2020-10-20 NOTE — Progress Notes (Signed)
Hematology and Oncology Follow Up Visit  Richard [name] [id number] [birth year] 80 y.o. 10/20/2020   Principle Diagnosis:  Diffuse large cell non-Hodgkin's lymphoma (IPI = 3) - NOT "double hit" Pernicious anemia Iron deficiency secondary to bleeding  Past Therapy: R-CHOP - s/p cycle8 - completed 08/2017  Current Therapy: Vitamin B12 1 mg IM every month Xgeva 120 mg subcu q 3 months - next doseduein06/2022 IV ironas indicated   Interim History:  Richard [name] is here today for follow-up and B 12 injection. He is doing well but has swelling in the left arm that is quite noticable. Radial pulse is 2+. He verbalized that he is taking his Eliquis as prescribed.  He also notes tenderness in his left shoulder at times. He states that he has an orthopedist.  No blood loss or petechiae. He did scrape both knees while putting his lawn mower into the back of his care. He has several scabs on both knees and band aids on the left knee.  He has still been playing golf several days a week.  No fever, chills, n/v, cough, rash, dizziness, SOB, chest pain, palpitations, abdominal pain or changes in bowel or bladder habits.  The neuropathy in his hands and feet is unchanged.   No falls or syncope.  He states that his blood sugars have been well controlled and he is hoping his Hgb A1c will be less than 6 at his next PCP visit.  He has maintained a good appetite and is staying well hydrated. His weight is stable at 238 lbs.   ECOG Performance Status: 1 - Symptomatic but completely ambulatory  Medications:  Allergies as of 10/20/2020      Reactions   Benazepril Swelling   angioedema; he is not a candidate for any angiotensin receptor blockers because of this significant allergic reaction. Because of a history of documented adverse serious drug reaction;Medi Alert bracelet  is recommended   Hctz [hydrochlorothiazide] Anaphylaxis, Swelling   Tongue and lip swelling   Aspirin Other (See  Comments)   Gastritis, cant take 325 Mg aspirin    Lactose Intolerance (gi) Nausea And Vomiting      Medication List       Accurate as of October 20, 2020  9:35 AM. If you have any questions, ask your nurse or doctor.        allopurinol 300 MG tablet Commonly known as: ZYLOPRIM Take 450 mg by mouth daily.   aluminum hydroxide-magnesium carbonate 95-358 MG/15ML Susp Commonly known as: GAVISCON Take 15 mLs by mouth as needed for indigestion or heartburn.   amLODipine 5 MG tablet Commonly known as: NORVASC TAKE 1 TABLET BY MOUTH TWICE A DAY   apixaban 2.5 MG Tabs tablet Commonly known as: Eliquis Take 1 tablet (2.5 mg total) by mouth 2 (two) times daily.   atorvastatin 80 MG tablet Commonly known as: LIPITOR TAKE ONE TABLET BY MOUTH AT BEDTIME   azelastine 0.1 % nasal spray Commonly known as: ASTELIN Place 2 sprays into both nostrils at bedtime as needed for rhinitis or allergies.   esomeprazole 40 MG capsule Commonly known as: NexIUM Take 1 capsule (40 mg total) by mouth daily.   ezetimibe 10 MG tablet Commonly known as: ZETIA TAKE 1 TABLET BY MOUTH EVERY DAY   fenofibrate 160 MG tablet Take 1 tablet (160 mg total) by mouth daily.   folic acid 1 MG tablet Commonly known as: FOLVITE TAKE 2 TABLETS BY MOUTH EVERY DAY   freestyle lancets USE TWICE A DAY  TO CHECK BLOOD SUGAR. DX E11.9   FREESTYLE LITE test strip Generic drug: glucose blood USE TO TEST BLOOD SUGAR ONCE A DAY. DX CODE: E11.9   glimepiride 1 MG tablet Commonly known as: AMARYL TAKE 1 TABLET (1 MG TOTAL) BY MOUTH DAILY WITH BREAKFAST.   hydrocortisone cream 1 % Apply 1 application topically 2 (two) times daily.   metFORMIN 1000 MG tablet Commonly known as: GLUCOPHAGE TAKE 1 & 1/2 TABLETS BY MOUTH EVERY MORNING AND 1 TABLET IN THE EVENING.   metoprolol tartrate 25 MG tablet Commonly known as: LOPRESSOR TAKE ONE-HALF TABLET BY MOUTH TWICE DAILY   nitroGLYCERIN 0.4 MG SL tablet Commonly  known as: NITROSTAT PLACE 1 TABLET (0.4 MG TOTAL) UNDER THE TONGUE EVERY 5 (FIVE) MINUTES AS NEEDED FOR CHEST PAIN.   polycarbophil 625 MG tablet Commonly known as: FIBERCON Take 625 mg by mouth daily.   potassium chloride SA 20 MEQ tablet Commonly known as: Klor-Con M20 Take 2 tablets (40 mEq total) by mouth 2 (two) times daily.   pregabalin 150 MG capsule Commonly known as: LYRICA TAKE 1 CAPSULE BY MOUTH TWICE A DAY   psyllium 58.6 % powder Commonly known as: METAMUCIL Take 1 packet by mouth daily as needed (constipation).   torsemide 20 MG tablet Commonly known as: DEMADEX Take 40 mg by mouth daily.   Vascepa 1 g capsule Generic drug: icosapent Ethyl TAKE 2 CAPSULES BY MOUTH TWICE A DAY       Allergies:  Allergies  Allergen Reactions  . Benazepril Swelling    angioedema; he is not a candidate for any angiotensin receptor blockers because of this significant allergic reaction. Because of a history of documented adverse serious drug reaction;Medi Alert bracelet  is recommended  . Hctz [Hydrochlorothiazide] Anaphylaxis and Swelling    Tongue and lip swelling   . Aspirin Other (See Comments)    Gastritis, cant take 325 Mg aspirin   . Lactose Intolerance (Gi) Nausea And Vomiting    Past Medical History, Surgical history, Social history, and Family History were reviewed and updated.  Review of Systems: All other 10 point review of systems is negative.   Physical Exam:  height is 6' (1.829 m) and weight is 238 lb 1.3 oz (108 kg). His blood pressure is 128/58 (abnormal).   Wt Readings from Last 3 Encounters:  10/20/20 238 lb 1.3 oz (108 kg)  10/05/20 248 lb (112.5 kg)  09/25/20 250 lb 6.4 oz (113.6 kg)    Ocular: Sclerae unicteric, pupils equal, round and reactive to light Ear-nose-throat: Oropharynx clear, dentition fair Lymphatic: No cervical or supraclavicular adenopathy Lungs no rales or rhonchi, good excursion bilaterally Heart regular rate and rhythm, no  murmur appreciated Abd soft, nontender, positive bowel sounds MSK no focal spinal tenderness, no joint edema Neuro: non-focal, well-oriented, appropriate affect Breasts: Deferred   Lab Results  Component Value Date   WBC 7.0 10/20/2020   HGB 12.0 (L) 10/20/2020   HCT 37.0 (L) 10/20/2020   MCV 89.4 10/20/2020   PLT 223 10/20/2020   Lab Results  Component Value Date   FERRITIN 1,242 (H) 09/19/2020   IRON 90 09/19/2020   TIBC 370 09/19/2020   UIBC 280 09/19/2020   IRONPCTSAT 24 09/19/2020   Lab Results  Component Value Date   RETICCTPCT 1.8 10/20/2020   RBC 4.18 (L) 10/20/2020   RETICCTABS 52.1 11/22/2011   No results found for: Nils Pyle [facility] Lab Results  Component Value Date   IGA 159 03/09/2012   Lab  Results  Component Value Date   ALBUMINELP 4.2 07/27/2018   MSPIKE Not Observed 07/27/2018     Chemistry      Component Value Date/Time   NA 138 09/19/2020 0906   NA 136 03/16/2019 0938   NA 144 06/27/2017 0857   K 4.2 09/19/2020 0906   K 3.9 06/27/2017 0857   CL 101 09/19/2020 0906   CL 103 06/27/2017 0857   CO2 28 09/19/2020 0906   CO2 26 06/27/2017 0857   BUN 52 (H) 09/19/2020 0906   BUN 34 (H) 03/16/2019 0938   BUN 12 06/27/2017 0857   CREATININE 1.32 (H) 09/19/2020 0906   CREATININE 1.65 (H) 04/18/2020 0956      Component Value Date/Time   CALCIUM 10.8 (H) 09/19/2020 0906   CALCIUM 9.2 06/27/2017 0857   ALKPHOS 55 09/19/2020 0906   ALKPHOS 128 (H) 06/27/2017 0857   AST 16 09/19/2020 0906   ALT 20 09/19/2020 0906   ALT 24 06/27/2017 0857   BILITOT 0.5 09/19/2020 0906       Impression and Plan: Mr. Greenlaw is a very pleasant 80yo caucasian gentleman withdiffuse large B-cell lymphoma (not"double hit"lymphoma). He completed treatment in February 2019. He received B 12 today as planned. Iron studies pending.  We will get an US of the left arm to rule out clot. If this is negative, we will have him follow-up with his  orthopedist.  Follow-up in another month.  He was encouraged to contact our office with any questions or concerns.   Laverna Peace, NP 4/22/20229:35 AM

## 2020-10-20 NOTE — Patient Instructions (Signed)

## 2020-10-20 NOTE — Patient Instructions (Signed)

## 2020-10-23 DIAGNOSIS — M25512 Pain in left shoulder: Secondary | ICD-10-CM | POA: Diagnosis not present

## 2020-10-23 DIAGNOSIS — Z96659 Presence of unspecified artificial knee joint: Secondary | ICD-10-CM | POA: Diagnosis not present

## 2020-10-23 DIAGNOSIS — R2232 Localized swelling, mass and lump, left upper limb: Secondary | ICD-10-CM | POA: Diagnosis not present

## 2020-10-24 ENCOUNTER — Encounter (HOSPITAL_BASED_OUTPATIENT_CLINIC_OR_DEPARTMENT_OTHER): Payer: Self-pay

## 2020-10-24 ENCOUNTER — Other Ambulatory Visit: Payer: Self-pay

## 2020-10-24 ENCOUNTER — Telehealth: Payer: Self-pay | Admitting: *Deleted

## 2020-10-24 ENCOUNTER — Ambulatory Visit (HOSPITAL_BASED_OUTPATIENT_CLINIC_OR_DEPARTMENT_OTHER)
Admission: RE | Admit: 2020-10-24 | Discharge: 2020-10-24 | Disposition: A | Discharge status: Home / Self Care | Payer: Medicare Other | Source: Ambulatory Visit | Attending: Family | Admitting: Family

## 2020-10-24 ENCOUNTER — Other Ambulatory Visit: Payer: Self-pay | Admitting: Family

## 2020-10-24 DIAGNOSIS — M7989 Other specified soft tissue disorders: Secondary | ICD-10-CM

## 2020-10-24 DIAGNOSIS — C8590 Non-Hodgkin lymphoma, unspecified, unspecified site: Secondary | ICD-10-CM | POA: Diagnosis not present

## 2020-10-24 DIAGNOSIS — C8332 Diffuse large B-cell lymphoma, intrathoracic lymph nodes: Secondary | ICD-10-CM | POA: Insufficient documentation

## 2020-10-24 DIAGNOSIS — I700 Atherosclerosis of aorta: Secondary | ICD-10-CM | POA: Diagnosis not present

## 2020-10-24 DIAGNOSIS — I2510 Atherosclerotic heart disease of native coronary artery without angina pectoris: Secondary | ICD-10-CM | POA: Diagnosis not present

## 2020-10-24 MED ORDER — IOHEXOL 300 MG/ML  SOLN
100.0000 mL | Freq: Once | INTRAMUSCULAR | Status: AC | PRN
  Administered 2020-10-24: 75 mL via INTRAVENOUS

## 2020-10-24 MED ORDER — METHYLPREDNISOLONE 4 MG PO TBPK: ORAL_TABLET | ORAL | 0 refills | Status: DC

## 2020-10-24 NOTE — Telephone Encounter (Signed)
Called pt regarding swelling. Reviewed below comments from [place], [place] with pt. Gave pt instructions address and ph# for medial supply store regarding the compression sleeve. Pt verbalized understanding, will pick up rx today and purchase compression sleeve. No further concerns.

## 2020-10-24 NOTE — Progress Notes (Signed)
Medrol dose pack for axillary strain and advised patient to take a break from golfing for a week or so and he can also try a compression sleeve on the left arm as well.

## 2020-10-24 NOTE — Telephone Encounter (Signed)
Call received from patient stating that he saw Dr. Gladstone Lighter yesterday r/t swelling in left arm and hand and that Dr. Gladstone Lighter instructed pt to see Dr. Marin Olp for further f/u.  Dr. Marin Olp notified and CT of the chest ordered for pt.  Patient notified that Dr. Marin Olp would like a CT chest ordered.  CT to be done today at 11:00AM.  Pt aware.

## 2020-10-24 NOTE — Telephone Encounter (Signed)
-----   Message from Eliezer Bottom, NP sent at 10/24/2020  2:39 PM EDT ----- I reviewed with Dr. Marin Olp and he has some left axiliary straining. Take it easy on the golfing for a week or so. I'm sending in a medrol dose pack. Let us know if the swelling does not improve. He can also get a compression sleeve from his local medical supply store! Thank you!  Sarah  ----- Message ----- From: Buel Ream, Rad Results In Sent: 10/24/2020  11:47 AM EDT To: Eliezer Bottom, NP

## 2020-10-26 DIAGNOSIS — I129 Hypertensive chronic kidney disease with stage 1 through stage 4 chronic kidney disease, or unspecified chronic kidney disease: Secondary | ICD-10-CM | POA: Diagnosis not present

## 2020-10-26 DIAGNOSIS — R609 Edema, unspecified: Secondary | ICD-10-CM | POA: Diagnosis not present

## 2020-10-26 DIAGNOSIS — N2581 Secondary hyperparathyroidism of renal origin: Secondary | ICD-10-CM | POA: Diagnosis not present

## 2020-10-26 DIAGNOSIS — D649 Anemia, unspecified: Secondary | ICD-10-CM | POA: Diagnosis not present

## 2020-10-26 DIAGNOSIS — N1831 Chronic kidney disease, stage 3a: Secondary | ICD-10-CM | POA: Diagnosis not present

## 2020-10-28 ENCOUNTER — Other Ambulatory Visit: Payer: Self-pay | Admitting: Family Medicine

## 2020-10-30 ENCOUNTER — Encounter: Payer: Self-pay | Admitting: Internal Medicine

## 2020-10-30 ENCOUNTER — Ambulatory Visit (INDEPENDENT_AMBULATORY_CARE_PROVIDER_SITE_OTHER): Payer: Medicare Other | Admitting: Internal Medicine

## 2020-10-30 ENCOUNTER — Other Ambulatory Visit: Payer: Self-pay

## 2020-10-30 VITALS — BP 140/72 | HR 84 | Ht 72.0 in | Wt 244.0 lb

## 2020-10-30 DIAGNOSIS — I2510 Atherosclerotic heart disease of native coronary artery without angina pectoris: Secondary | ICD-10-CM

## 2020-10-30 DIAGNOSIS — Z952 Presence of prosthetic heart valve: Secondary | ICD-10-CM | POA: Diagnosis not present

## 2020-10-30 DIAGNOSIS — Z950 Presence of cardiac pacemaker: Secondary | ICD-10-CM | POA: Diagnosis not present

## 2020-10-30 DIAGNOSIS — Z951 Presence of aortocoronary bypass graft: Secondary | ICD-10-CM

## 2020-10-30 DIAGNOSIS — E783 Hyperchylomicronemia: Secondary | ICD-10-CM

## 2020-10-30 DIAGNOSIS — I25810 Atherosclerosis of coronary artery bypass graft(s) without angina pectoris: Secondary | ICD-10-CM

## 2020-10-30 NOTE — Patient Instructions (Signed)
Medication Instructions:  No Changes In Medications at this time.  *If you need a refill on your cardiac medications before your next appointment, please call your pharmacy*  Follow-Up: At [facility], you and your health needs are our priority.  As part of our continuing mission to provide you with exceptional heart care, we have created designated Provider Care Teams.  These Care Teams include your primary Cardiologist (physician) and Advanced Practice Providers (APPs -  Physician Assistants and Nurse Practitioners) who all work together to provide you with the care you need, when you need it.  Your next appointment:   AS NEEDED   The format for your next appointment:   In Person  Provider:   K. Chad Hilty, MD  

## 2020-11-02 NOTE — Progress Notes (Signed)
LIPID CLINIC CONSULT NOTE  Chief Complaint:  High triglycerides  Primary Care Physician: Carollee Herter, Alferd Apa, DO  Primary Cardiologist:  Minus Breeding, MD  HPI:  Richard [name] is a 80 y.o. male who is being seen today for the evaluation of high trigylcerides at the request of [name], *. This is an 80 yo male with history of coronary artery disease status post CABG x4 in 2001, bradycardia and complete heart block s/p PPM, severe AS s/p TAVR, hypertension, dyslipidemia with high triglycerides, OSA on CPAP, DM2 and other medical problems, who was referred for evaluation and management of high triglycerides.  He is followed by Dr. Percival Spanish and Dr. Caryl Comes.  Recent lipid profile in January showed total cholesterol of 159, triglycerides 839, HDL of 29 and a direct LDL of 47.  Hemoglobin A1c is 6.7, down from 7.0 just a few months ago.  [name] is currently on atorvastatin 80 mg daily, ezetimibe 10 mg daily, fenofibrate 160 mg daily and Vascepa 2 g twice daily.  Despite this combination of medications his triglycerides remain significantly elevated, however LDL cholesterol is at goal.  PMHx:  Past Medical History:  Diagnosis Date  . Anemia   . Arthritis    hips  . Axillary adenopathy 02/25/2017  . Bradycardia    a. holter monitor has demonstrated HRs in 30s and [place]   . CAD (coronary artery disease)    a. s/p CABG 2001  b.  07/28/2017 cath:   Severe three-vessel native CAD with total occlusion of LAD, ramus intermedius, first OM and RCA, patent RIMA to PDA, LIMA to LAD, sequential SVG to ramus intermedius and first OM.    [name] Chronic lower back pain   . Diffuse large B cell lymphoma ([place])   . Diverticulosis   . Esophageal stricture   . GERD (gastroesophageal reflux disease)   . History of gout   . HTN (hypertension)   . Mixed hyperlipidemia   . OSA on CPAP    with 2L O2 at night  . Pancytopenia ([place])    a. related to chemo therapy for B cell lymphoma  .  Peptic stricture of esophagus   . Presence of permanent cardiac pacemaker    sees Dr. Valaria Good pacemaker  . Severe aortic stenosis   . Spinal stenosis   . Squamous cell carcinoma of skin 03/22/2009   Right mandible. SCCis, hypertrophic.   . Type II diabetes mellitus ([place])   . Wears dentures    partial upper    Past Surgical History:  Procedure Laterality Date  . APPENDECTOMY  ~ 1952  . BACK SURGERY    . CATARACT EXTRACTION W/PHACO Left 11/06/2016   Procedure: CATARACT EXTRACTION PHACO AND INTRAOCULAR LENS PLACEMENT (IOC);  Surgeon: Estill Cotta, MD;  Location: [facility];  Service: Ophthalmology;  Laterality: Left;  Lot # [id number] H [place]: 01:09.4 AP%:25.2 CDE: 30.64  . CATARACT EXTRACTION W/PHACO Right 12/04/2016   Procedure: CATARACT EXTRACTION PHACO AND INTRAOCULAR LENS PLACEMENT (IOC);  Surgeon: Estill Cotta, MD;  Location: [facility];  Service: Ophthalmology;  Laterality: Right;  [place] 1:25.9 AP% 24.1 CDE 39.10 Fluid Pack lot # [id number]  . COLONOSCOPY W/ BIOPSIES AND POLYPECTOMY  2013  . CORONARY ANGIOPLASTY  1993  . CORONARY ANGIOPLASTY WITH STENT PLACEMENT  05/1997   "1"  . CORONARY ARTERY BYPASS GRAFT  03/2000   "CABG X5"  . ECTROPION REPAIR Right 09/01/2018   Procedure: REPAIR OF ECTROPION BILATERAL upper and lower;  Surgeon: Vickki Muff,  Philis Pique, MD;  Location: [place];  Service: Ophthalmology;  Laterality: Right;  Diabetic - oral meds sleep apnea  . ESOPHAGEAL DILATION  X 3-4   Dr. Lyla Son; "last one was in the 1990's"  . ESOPHAGOGASTRODUODENOSCOPY     multiple  . FLEXIBLE SIGMOIDOSCOPY     multiple  . HYDRADENITIS EXCISION Left 02/25/2017   Procedure: EXCISION DEEP LEFT AXILLARY LYMPH NODE;  Surgeon: Fanny Skates, MD;  Location: [place];  Service: General;  Laterality: Left;  . INTRAOPERATIVE TRANSTHORACIC ECHOCARDIOGRAM N/A 09/09/2017   Procedure: INTRAOPERATIVE TRANSTHORACIC ECHOCARDIOGRAM;  Surgeon: Burnell Blanks, MD;  Location: [place];   Service: Open Heart Surgery;  Laterality: N/A;  . KNEE ARTHROSCOPY Left 2011   meniscus repair  . LEFT HEART CATHETERIZATION WITH CORONARY/GRAFT ANGIOGRAM N/A 03/16/2014   Procedure: LEFT HEART CATHETERIZATION WITH Beatrix Fetters;  Surgeon: Burnell Blanks, MD;  Location: [facility] CATH LAB;  Service: Cardiovascular;  Laterality: N/A;  . LUMBAR LAMINECTOMY/DECOMPRESSION MICRODISCECTOMY Right 06/17/2013   Procedure: LUMBAR LAMINECTOMY MICRODISCECTOMY L4-L5 RIGHT EXCISION OF SYNOVIAL CYST RIGHT   (1 LEVEL) RIGHT PARTIAL FACETECTOMY;  Surgeon: Tobi Bastos, MD;  Location: [facility];  Service: Orthopedics;  Laterality: Right;  . MYELOGRAM  04/06/13   lumbar, Dr Gladstone Lighter  . ORBITAL LESION EXCISION Right 09/01/2018   Procedure: ORBITOTOMY WITHOUT BONE FLAP WITH REMOVAL OF LESION RIGHT;  Surgeon: Karle Starch, MD;  Location: [place];  Service: Ophthalmology;  Laterality: Right;  . PACEMAKER IMPLANT N/A 09/10/2017   Procedure: PACEMAKER IMPLANT;  Surgeon: Deboraha Sprang, MD;  Location: [place] CV LAB;  Service: Cardiovascular;  Laterality: N/A;  . PANENDOSCOPY    . PORTACATH PLACEMENT N/A 03/06/2017   Procedure: INSERTION PORT-A-CATH AND ASPIRATE SEROMA LEFT AXILLA;  Surgeon: Fanny Skates, MD;  Location: [place];  Service: General;  Laterality: N/A;  . RIGHT/LEFT HEART CATH AND CORONARY/GRAFT ANGIOGRAPHY N/A 07/28/2017   Procedure: RIGHT/LEFT HEART CATH AND CORONARY/GRAFT ANGIOGRAPHY;  Surgeon: Sherren Mocha, MD;  Location: [place] CV LAB;  Service: Cardiovascular;  Laterality: N/A;  . SHOULDER SURGERY Right 08/2010   screws placed; "tendons tore off"  . SKIN CANCER EXCISION Right    "neck"  . TONSILLECTOMY  ~ 1954  . TOTAL KNEE ARTHROPLASTY Left 06/29/2019   Procedure: TOTAL KNEE ARTHROPLASTY;  Surgeon: Paralee Cancel, MD;  Location: [facility];  Service: Orthopedics;  Laterality: Left;  70 mins  . TRANSCATHETER AORTIC VALVE REPLACEMENT, TRANSFEMORAL N/A 09/09/2017   Procedure:  TRANSCATHETER AORTIC VALVE REPLACEMENT, TRANSFEMORAL;  Surgeon: Burnell Blanks, MD;  Location: [place];  Service: Open Heart Surgery;  Laterality: N/A;  . UPPER GI ENDOSCOPY  2013   Gastritis; Dr Carlean Purl  . VASECTOMY      FAMHx:  Family History  Problem Relation Age of Onset  . Stroke Father   . Hypertension Father   . Pancreatic cancer Mother   . Diabetes Maternal Grandmother   . Stroke Maternal Grandmother   . Heart attack Paternal Grandmother   . Colon cancer Neg Hx   . Esophageal cancer Neg Hx   . Rectal cancer Neg Hx   . Stomach cancer Neg Hx   . Ulcers Neg Hx     SOCHx:   reports that he has never smoked. He has never used smokeless tobacco. He reports that he does not drink alcohol and does not use drugs.  ALLERGIES:  Allergies  Allergen Reactions  . Benazepril Swelling    angioedema; he is not a candidate  for any angiotensin receptor blockers because of this significant allergic reaction. Because of a history of documented adverse serious drug reaction;Medi Alert bracelet  is recommended  . Hctz [Hydrochlorothiazide] Anaphylaxis and Swelling    Tongue and lip swelling   . Aspirin Other (See Comments)    Gastritis, cant take 325 Mg aspirin   . Lactose Intolerance (Gi) Nausea And Vomiting    ROS: Pertinent items noted in HPI and remainder of comprehensive ROS otherwise negative.  HOME MEDS: Current Outpatient Medications on File Prior to Visit  Medication Sig Dispense Refill  . Acetaminophen (TYLENOL) 325 MG CAPS Tylenol    . allopurinol (ZYLOPRIM) 300 MG tablet Take 450 mg by mouth daily.    [name] aluminum hydroxide-magnesium carbonate (GAVISCON) 95-358 MG/15ML SUSP Take 15 mLs by mouth as needed for indigestion or heartburn.    [name] amLODipine (NORVASC) 5 MG tablet TAKE 1 TABLET BY MOUTH TWICE A DAY 180 tablet 3  . apixaban (ELIQUIS) 2.5 MG TABS tablet Take 1 tablet (2.5 mg total) by mouth 2 (two) times daily. 180 tablet 1  . atorvastatin (LIPITOR) 80 MG  tablet TAKE ONE TABLET BY MOUTH AT BEDTIME 90 tablet 3  . azelastine (ASTELIN) 0.1 % nasal spray Place 2 sprays into both nostrils at bedtime as needed for rhinitis or allergies. 30 mL 12  . doxycycline (VIBRAMYCIN) 100 MG capsule doxycycline hyclate 100 mg capsule  Take 1 capsule twice a day by oral route for 14 days.    [name] doxycycline (VIBRAMYCIN) 100 MG capsule Take 100 mg by mouth 2 (two) times daily.    [name] esomeprazole (NEXIUM) 40 MG capsule Take 1 capsule (40 mg total) by mouth daily. 30 capsule 5  . ezetimibe (ZETIA) 10 MG tablet TAKE 1 TABLET BY MOUTH EVERY DAY 90 tablet 3  . fenofibrate 160 MG tablet Take 1 tablet (160 mg total) by mouth daily. 90 tablet 1  . folic acid (FOLVITE) 1 MG tablet TAKE 2 TABLETS BY MOUTH EVERY DAY 180 tablet 4  . FREESTYLE LITE test strip USE TO TEST BLOOD SUGAR ONCE A DAY. DX CODE: E11.9 100 strip 12  . glimepiride (AMARYL) 1 MG tablet TAKE 1 TABLET (1 MG TOTAL) BY MOUTH DAILY WITH BREAKFAST. 90 tablet 1  . hydrocortisone cream 1 % Apply 1 application topically 2 (two) times daily. 45 g 1  . Lancets (FREESTYLE) lancets USE TWICE A DAY TO CHECK BLOOD SUGAR. DX E11.9 100 each 6  . metFORMIN (GLUCOPHAGE) 1000 MG tablet TAKE 1 & 1/2 TABLETS BY MOUTH EVERY MORNING AND 1 TABLET IN THE EVENING. 225 tablet 1  . methylPREDNISolone (MEDROL DOSEPAK) 4 MG TBPK tablet Take as directed on the packaging. 21 each 0  . metoprolol tartrate (LOPRESSOR) 25 MG tablet TAKE ONE-HALF TABLET BY MOUTH TWICE DAILY 90 tablet 3  . nitroGLYCERIN (NITROSTAT) 0.4 MG SL tablet PLACE 1 TABLET (0.4 MG TOTAL) UNDER THE TONGUE EVERY 5 (FIVE) MINUTES AS NEEDED FOR CHEST PAIN. 25 tablet 6  . polycarbophil (FIBERCON) 625 MG tablet Take 625 mg by mouth daily.    . potassium chloride SA (KLOR-CON M20) 20 MEQ tablet Take 2 tablets (40 mEq total) by mouth 2 (two) times daily. 360 tablet 3  . pregabalin (LYRICA) 150 MG capsule TAKE 1 CAPSULE BY MOUTH TWICE A DAY 180 capsule 1  . psyllium (METAMUCIL) 58.6  % powder Take 1 packet by mouth daily as needed (constipation).     . torsemide (DEMADEX) 20 MG tablet Take 40 mg by  mouth daily.    [name] VASCEPA 1 g capsule TAKE 2 CAPSULES BY MOUTH TWICE A DAY 120 capsule 5   Current Facility-Administered Medications on File Prior to Visit  Medication Dose Route Frequency Provider Last Rate Last Admin  . sodium chloride flush (NS) 0.9 % injection 10 mL  10 mL Intravenous PRN [name], MD   10 mL at 05/30/17 0920    LABS/IMAGING: No results found. However, due to the size of the patient record, not all encounters were searched. Please check Results Review for a complete set of results. No results found.  LIPID PANEL:    Component Value Date/Time   CHOL 159 07/27/2020 0858   CHOL 159 01/25/2020 0819   TRIG (H) 07/27/2020 0858    839.0 Triglyceride is over 400; calculations on Lipids are invalid.   HDL 29.60 (L) 07/27/2020 0858   HDL 31 (L) 01/25/2020 0819   CHOLHDL 5 07/27/2020 0858   VLDL 44.0 (H) 11/24/2018 0815   LDLCALC  04/18/2020 0956     Comment:     . LDL cholesterol not calculated. Triglyceride levels greater than 400 mg/dL invalidate calculated LDL results. . Reference range: <100 . Desirable range <100 mg/dL for primary prevention;   <70 mg/dL for patients with CHD or diabetic patients  with > or = 2 CHD risk factors. [name] LDL-C is now calculated using the Martin-Hopkins  calculation, which is a validated novel method providing  better accuracy than the Friedewald equation in the  estimation of LDL-C.  [name] et al. [name]. 6063;016(01): 2061-2068  (http://education.[employer].com/faq/FAQ164)    LDLDIRECT 47.0 07/27/2020 0858    WEIGHTS: Wt Readings from Last 3 Encounters:  10/30/20 244 lb (110.7 kg)  10/20/20 238 lb 1.3 oz (108 kg)  10/05/20 248 lb (112.5 kg)    VITALS: BP 140/72   Pulse 84   Ht 6' (1.829 m)   Wt 244 lb (110.7 kg)   SpO2 93%   BMI 33.09 kg/m   EXAM: General appearance: alert and no  distress Neck: no carotid bruit, no JVD and thyroid not enlarged, symmetric, no tenderness/mass/nodules Lungs: clear to auscultation bilaterally Heart: regular rate and rhythm, S1, S2 normal, no murmur, click, rub or gallop Abdomen: soft, non-tender; bowel sounds normal; no masses,  no organomegaly Extremities: extremities normal, atraumatic, no cyanosis or edema Pulses: 2+ and symmetric Skin: Skin color, texture, turgor normal. No rashes or lesions Neurologic: Grossly normal Psych: Pleasant  EKG: Deferred  ASSESSMENT: 1. Mixed dyslipidemia with hyperchylomicronemia 2. Coronary artery disease status post CABG 3. History of complete heart block status post permanent pacemaker  PLAN: 1.   Mr. 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  This is despite being on maximal therapies including statin, ezetimibe, fibrate and omega-3.  Further options are limited.  1 could consider PCSK9 inhibitor however his LDL cholesterol is already quite low.  He may actually be a very good candidate for our CORE trial, evaluating the mRNA inhibitor of ApoC3.  I will have our research nurses reach out to him for prescreening as we are already enrolling in the study.  Hopefully he will qualify and of course then will be randomized to either study drug or standard of care.  Thanks again for the kind referral.  Pixie Casino, MD, FACC, [place] Director of the [facility]  [facility] Diplomate of the American Board of Clinical Lipidology Attending Cardiologist  Direct Dial: [phone]  Fax: [phone]  Website:  [email or web address] [email or web address] 11/02/2020, 11:02 AM

## 2020-11-03 ENCOUNTER — Ambulatory Visit (INDEPENDENT_AMBULATORY_CARE_PROVIDER_SITE_OTHER): Payer: Medicare Other

## 2020-11-03 DIAGNOSIS — I442 Atrioventricular block, complete: Secondary | ICD-10-CM

## 2020-11-03 LAB — CUP PACEART REMOTE DEVICE CHECK
Battery Remaining Longevity: 23 mo
Battery Voltage: 2.92 V
Brady Statistic AP VP Percent: 13.49 %
Brady Statistic AP VS Percent: 0 %
Brady Statistic AS VP Percent: 86.33 %
Brady Statistic AS VS Percent: 0.17 %
Brady Statistic RA Percent Paced: 13.53 %
Brady Statistic RV Percent Paced: 99.83 %
Date Time Interrogation Session: 20220506070240
Implantable Lead Implant Date: 20190313
Implantable Lead Implant Date: 20190313
Implantable Lead Location: 753859
Implantable Lead Location: 753860
Implantable Lead Model: 3830
Implantable Lead Model: 5076
Implantable Pulse Generator Implant Date: 20190313
Lead Channel Impedance Value: 285 Ohm
Lead Channel Impedance Value: 323 Ohm
Lead Channel Impedance Value: 399 Ohm
Lead Channel Impedance Value: 418 Ohm
Lead Channel Pacing Threshold Amplitude: 0.875 V
Lead Channel Pacing Threshold Amplitude: 1 V
Lead Channel Pacing Threshold Pulse Width: 0.4 ms
Lead Channel Pacing Threshold Pulse Width: 0.4 ms
Lead Channel Sensing Intrinsic Amplitude: 1.5 mV
Lead Channel Sensing Intrinsic Amplitude: 1.5 mV
Lead Channel Sensing Intrinsic Amplitude: 1.875 mV
Lead Channel Sensing Intrinsic Amplitude: 6.125 mV
Lead Channel Setting Pacing Amplitude: 1.75 V
Lead Channel Setting Pacing Amplitude: 3.5 V
Lead Channel Setting Pacing Pulse Width: 1 ms
Lead Channel Setting Sensing Sensitivity: 1.2 mV

## 2020-11-16 ENCOUNTER — Other Ambulatory Visit: Payer: Self-pay | Admitting: Family Medicine

## 2020-11-16 NOTE — Progress Notes (Signed)
Remote pacemaker transmission.   

## 2020-11-20 ENCOUNTER — Inpatient Hospital Stay: Payer: Medicare Other

## 2020-11-20 ENCOUNTER — Encounter: Payer: Self-pay | Admitting: Family

## 2020-11-20 ENCOUNTER — Other Ambulatory Visit: Payer: Self-pay

## 2020-11-20 ENCOUNTER — Inpatient Hospital Stay: Payer: Medicare Other | Attending: Hematology & Oncology

## 2020-11-20 ENCOUNTER — Inpatient Hospital Stay (HOSPITAL_BASED_OUTPATIENT_CLINIC_OR_DEPARTMENT_OTHER): Payer: Medicare Other | Admitting: Family

## 2020-11-20 VITALS — BP 125/74 | HR 85 | Temp 97.9°F | Resp 20 | Ht 72.0 in | Wt 244.0 lb

## 2020-11-20 DIAGNOSIS — M7989 Other specified soft tissue disorders: Secondary | ICD-10-CM

## 2020-11-20 DIAGNOSIS — L089 Local infection of the skin and subcutaneous tissue, unspecified: Secondary | ICD-10-CM | POA: Diagnosis not present

## 2020-11-20 DIAGNOSIS — C8332 Diffuse large B-cell lymphoma, intrathoracic lymph nodes: Secondary | ICD-10-CM

## 2020-11-20 DIAGNOSIS — Z886 Allergy status to analgesic agent status: Secondary | ICD-10-CM | POA: Insufficient documentation

## 2020-11-20 DIAGNOSIS — D500 Iron deficiency anemia secondary to blood loss (chronic): Secondary | ICD-10-CM

## 2020-11-20 DIAGNOSIS — D519 Vitamin B12 deficiency anemia, unspecified: Secondary | ICD-10-CM

## 2020-11-20 DIAGNOSIS — D510 Vitamin B12 deficiency anemia due to intrinsic factor deficiency: Secondary | ICD-10-CM | POA: Insufficient documentation

## 2020-11-20 DIAGNOSIS — G629 Polyneuropathy, unspecified: Secondary | ICD-10-CM | POA: Insufficient documentation

## 2020-11-20 DIAGNOSIS — D508 Other iron deficiency anemias: Secondary | ICD-10-CM | POA: Diagnosis not present

## 2020-11-20 DIAGNOSIS — I712 Thoracic aortic aneurysm, without rupture: Secondary | ICD-10-CM | POA: Insufficient documentation

## 2020-11-20 DIAGNOSIS — C8338 Diffuse large B-cell lymphoma, lymph nodes of multiple sites: Secondary | ICD-10-CM | POA: Diagnosis not present

## 2020-11-20 DIAGNOSIS — N1831 Chronic kidney disease, stage 3a: Secondary | ICD-10-CM | POA: Diagnosis not present

## 2020-11-20 DIAGNOSIS — S40812A Abrasion of left upper arm, initial encounter: Secondary | ICD-10-CM | POA: Diagnosis not present

## 2020-11-20 DIAGNOSIS — W5503XA Scratched by cat, initial encounter: Secondary | ICD-10-CM | POA: Insufficient documentation

## 2020-11-20 DIAGNOSIS — Z951 Presence of aortocoronary bypass graft: Secondary | ICD-10-CM | POA: Insufficient documentation

## 2020-11-20 DIAGNOSIS — Z950 Presence of cardiac pacemaker: Secondary | ICD-10-CM | POA: Insufficient documentation

## 2020-11-20 DIAGNOSIS — I7121 Aneurysm of the ascending aorta, without rupture: Secondary | ICD-10-CM | POA: Insufficient documentation

## 2020-11-20 LAB — CMP (CANCER CENTER ONLY)
ALT: 18 U/L (ref 0–44)
AST: 16 U/L (ref 15–41)
Albumin: 4.6 g/dL (ref 3.5–5.0)
Alkaline Phosphatase: 50 U/L (ref 38–126)
Anion gap: 11 (ref 5–15)
BUN: 35 mg/dL — ABNORMAL HIGH (ref 8–23)
CO2: 25 mmol/L (ref 22–32)
Calcium: 10.2 mg/dL (ref 8.9–10.3)
Chloride: 103 mmol/L (ref 98–111)
Creatinine: 1.59 mg/dL — ABNORMAL HIGH (ref 0.61–1.24)
GFR, Estimated: 43 mL/min — ABNORMAL LOW (ref >60)
Glucose, Bld: 151 mg/dL — ABNORMAL HIGH (ref 70–99)
Potassium: 3.9 mmol/L (ref 3.5–5.1)
Sodium: 139 mmol/L (ref 135–145)
Total Bilirubin: 0.5 mg/dL (ref 0.3–1.2)
Total Protein: 6.8 g/dL (ref 6.5–8.1)

## 2020-11-20 LAB — CBC WITH DIFFERENTIAL (CANCER CENTER ONLY)
Abs Immature Granulocytes: 0 10*3/uL (ref 0.00–0.07)
Basophils Absolute: 0 10*3/uL (ref 0.0–0.1)
Basophils Relative: 0 %
Eosinophils Absolute: 0.2 10*3/uL (ref 0.0–0.5)
Eosinophils Relative: 3 %
HCT: 38.7 % — ABNORMAL LOW (ref 39.0–52.0)
Hemoglobin: 12.5 g/dL — ABNORMAL LOW (ref 13.0–17.0)
Lymphocytes Relative: 20 %
Lymphs Abs: 1.5 10*3/uL (ref 0.7–4.0)
MCH: 28.8 pg (ref 26.0–34.0)
MCHC: 32.3 g/dL (ref 30.0–36.0)
MCV: 89.2 fL (ref 80.0–100.0)
Monocytes Absolute: 0.5 10*3/uL (ref 0.1–1.0)
Monocytes Relative: 7 %
Neutro Abs: 5.3 10*3/uL (ref 1.7–7.7)
Neutrophils Relative %: 70 %
Platelet Count: 203 10*3/uL (ref 150–400)
RBC: 4.34 MIL/uL (ref 4.22–5.81)
RDW: 16.5 % — ABNORMAL HIGH (ref 11.5–15.5)
WBC Count: 7.5 10*3/uL (ref 4.0–10.5)
nRBC: 0 % (ref 0.0–0.2)

## 2020-11-20 LAB — RETICULOCYTES
Immature Retic Fract: 23.4 % — ABNORMAL HIGH (ref 2.3–15.9)
RBC.: 4.35 MIL/uL (ref 4.22–5.81)
Retic Count, Absolute: 81.3 10*3/uL (ref 19.0–186.0)
Retic Ct Pct: 1.9 % (ref 0.4–3.1)

## 2020-11-20 LAB — VITAMIN B12: Vitamin B-12: 148 pg/mL — ABNORMAL LOW (ref 180–914)

## 2020-11-20 MED ORDER — CYANOCOBALAMIN 1000 MCG/ML IJ SOLN
INTRAMUSCULAR | Status: AC
  Filled 2020-11-20: qty 1

## 2020-11-20 MED ORDER — AMOXICILLIN-POT CLAVULANATE 875-125 MG PO TABS: 1.0000 | ORAL_TABLET | Freq: Two times a day (BID) | ORAL | 0 refills | Status: DC

## 2020-11-20 MED ORDER — CYANOCOBALAMIN 1000 MCG/ML IJ SOLN
1000.0000 ug | Freq: Once | INTRAMUSCULAR | Status: AC
  Administered 2020-11-20: 1000 ug via INTRAMUSCULAR

## 2020-11-20 NOTE — Progress Notes (Signed)
Cardiology Office Note   Date:  11/21/2020   ID:  Richard [name], DOB [birth year], MRN [id number]  PCP:  Richard [name], Richard Apa, DO  Cardiologist:   Minus Breeding, MD  Chief Complaint  Patient presents with  . Arm Swelling      History of Present Illness: Richard [name] is a 80 y.o. male who presents for follow up of CAD and TAVR.  He has had atrial flutter and heart block as well necessitating a pacemaker.   He has had continued problems with triglycerides.  He has some renal insufficiency.   Since I last saw him except he has had increased left arm swelling.  I was able to review some imaging he had no significant abnormalities on CT and ultrasound demonstrated no venous thrombosis.  He was ultimately told he might have a torn muscle but it persists and he has not found relief with a compression garment.  He says it does not hurt but it swells.  He denies any chest pressure, neck or arm discomfort.  He said no shortness of breath, PND or orthopnea.  He otherwise says he is feeling quite well.  He exercises routinely.  He does balance exercises and walks on a treadmill.  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  He was a minor league baseball player.    Past Medical History:  Diagnosis Date  . Anemia   . Arthritis    hips  . Axillary adenopathy 02/25/2017  . Bradycardia    a. holter monitor has demonstrated HRs in 30s and [place]   . CAD (coronary artery disease)    a. s/p CABG 2001  b.  07/28/2017 cath:   Severe three-vessel native CAD with total occlusion of LAD, ramus intermedius, first OM and RCA, patent RIMA to PDA, LIMA to LAD, sequential SVG to ramus intermedius and first OM.    [name] Chronic lower back pain   . Diffuse large B cell lymphoma ([place])   . Diverticulosis   . Esophageal stricture   . GERD (gastroesophageal reflux disease)   . History of gout   . HTN (hypertension)   . Mixed  hyperlipidemia   . OSA on CPAP    with 2L O2 at night  . Pancytopenia ([place])    a. related to chemo therapy for B cell lymphoma  . Peptic stricture of esophagus   . Presence of permanent cardiac pacemaker    sees Dr. Valaria Good pacemaker  . Severe aortic stenosis   . Spinal stenosis   . Squamous cell carcinoma of skin 03/22/2009   Right mandible. SCCis, hypertrophic.   . Type II diabetes mellitus ([place])   . Wears dentures    partial upper    Past Surgical History:  Procedure Laterality Date  . APPENDECTOMY  ~ 1952  . BACK SURGERY    . CATARACT EXTRACTION W/PHACO Left 11/06/2016   Procedure: CATARACT EXTRACTION PHACO AND INTRAOCULAR LENS PLACEMENT (IOC);  Surgeon: Estill Cotta, MD;  Location: [facility];  Service: Ophthalmology;  Laterality: Left;  Lot # [id number] H [place]: 01:09.4 AP%:25.2 CDE: 30.64  . CATARACT EXTRACTION W/PHACO Right 12/04/2016   Procedure: CATARACT EXTRACTION PHACO AND INTRAOCULAR LENS PLACEMENT (IOC);  Surgeon: Estill Cotta, MD;  Location: [facility];  Service: Ophthalmology;  Laterality: Right;  [place] 1:25.9 AP% 24.1 CDE 39.10 Fluid Pack lot # [id number]  . COLONOSCOPY W/ BIOPSIES  AND POLYPECTOMY  2013  . CORONARY ANGIOPLASTY  1993  . CORONARY ANGIOPLASTY WITH STENT PLACEMENT  05/1997   "1"  . CORONARY ARTERY BYPASS GRAFT  03/2000   "CABG X5"  . ECTROPION REPAIR Right 09/01/2018   Procedure: REPAIR OF ECTROPION BILATERAL upper and lower;  Surgeon: Karle Starch, MD;  Location: [place];  Service: Ophthalmology;  Laterality: Right;  Diabetic - oral meds sleep apnea  . ESOPHAGEAL DILATION  X 3-4   Dr. Lyla Son; "last one was in the 1990's"  . ESOPHAGOGASTRODUODENOSCOPY     multiple  . FLEXIBLE SIGMOIDOSCOPY     multiple  . HYDRADENITIS EXCISION Left 02/25/2017   Procedure: EXCISION DEEP LEFT AXILLARY LYMPH NODE;  Surgeon: Fanny Skates, MD;  Location: [place];  Service: General;  Laterality: Left;  . INTRAOPERATIVE TRANSTHORACIC ECHOCARDIOGRAM  N/A 09/09/2017   Procedure: INTRAOPERATIVE TRANSTHORACIC ECHOCARDIOGRAM;  Surgeon: Burnell Blanks, MD;  Location: [place];  Service: Open Heart Surgery;  Laterality: N/A;  . KNEE ARTHROSCOPY Left 2011   meniscus repair  . LEFT HEART CATHETERIZATION WITH CORONARY/GRAFT ANGIOGRAM N/A 03/16/2014   Procedure: LEFT HEART CATHETERIZATION WITH Beatrix Fetters;  Surgeon: Burnell Blanks, MD;  Location: [facility] CATH LAB;  Service: Cardiovascular;  Laterality: N/A;  . LUMBAR LAMINECTOMY/DECOMPRESSION MICRODISCECTOMY Right 06/17/2013   Procedure: LUMBAR LAMINECTOMY MICRODISCECTOMY L4-L5 RIGHT EXCISION OF SYNOVIAL CYST RIGHT   (1 LEVEL) RIGHT PARTIAL FACETECTOMY;  Surgeon: Tobi Bastos, MD;  Location: [facility];  Service: Orthopedics;  Laterality: Right;  . MYELOGRAM  04/06/13   lumbar, Dr Gladstone Lighter  . ORBITAL LESION EXCISION Right 09/01/2018   Procedure: ORBITOTOMY WITHOUT BONE FLAP WITH REMOVAL OF LESION RIGHT;  Surgeon: Karle Starch, MD;  Location: [place];  Service: Ophthalmology;  Laterality: Right;  . PACEMAKER IMPLANT N/A 09/10/2017   Procedure: PACEMAKER IMPLANT;  Surgeon: Deboraha Sprang, MD;  Location: [place] CV LAB;  Service: Cardiovascular;  Laterality: N/A;  . PANENDOSCOPY    . PORTACATH PLACEMENT N/A 03/06/2017   Procedure: INSERTION PORT-A-CATH AND ASPIRATE SEROMA LEFT AXILLA;  Surgeon: Fanny Skates, MD;  Location: [place];  Service: General;  Laterality: N/A;  . RIGHT/LEFT HEART CATH AND CORONARY/GRAFT ANGIOGRAPHY N/A 07/28/2017   Procedure: RIGHT/LEFT HEART CATH AND CORONARY/GRAFT ANGIOGRAPHY;  Surgeon: Sherren Mocha, MD;  Location: [place] CV LAB;  Service: Cardiovascular;  Laterality: N/A;  . SHOULDER SURGERY Right 08/2010   screws placed; "tendons tore off"  . SKIN CANCER EXCISION Right    "neck"  . TONSILLECTOMY  ~ 1954  . TOTAL KNEE ARTHROPLASTY Left 06/29/2019   Procedure: TOTAL KNEE ARTHROPLASTY;  Surgeon: Paralee Cancel, MD;  Location: [facility];   Service: Orthopedics;  Laterality: Left;  70 mins  . TRANSCATHETER AORTIC VALVE REPLACEMENT, TRANSFEMORAL N/A 09/09/2017   Procedure: TRANSCATHETER AORTIC VALVE REPLACEMENT, TRANSFEMORAL;  Surgeon: Burnell Blanks, MD;  Location: [place];  Service: Open Heart Surgery;  Laterality: N/A;  . UPPER GI ENDOSCOPY  2013   Gastritis; Dr Carlean Purl  . VASECTOMY       Current Outpatient Medications  Medication Sig Dispense Refill  . Acetaminophen (TYLENOL) 325 MG CAPS Tylenol    . allopurinol (ZYLOPRIM) 300 MG tablet Take 450 mg by mouth daily.    [name] aluminum hydroxide-magnesium carbonate (GAVISCON) 95-358 MG/15ML SUSP Take 15 mLs by mouth as needed for indigestion or heartburn.    [name] amLODipine (NORVASC) 5 MG tablet TAKE 1 TABLET BY MOUTH TWICE A DAY 180 tablet 3  . amoxicillin-clavulanate (  AUGMENTIN) 875-125 MG tablet Take 1 tablet by mouth 2 (two) times daily. 14 tablet 0  . apixaban (ELIQUIS) 2.5 MG TABS tablet Take 1 tablet (2.5 mg total) by mouth 2 (two) times daily. 180 tablet 1  . atorvastatin (LIPITOR) 80 MG tablet TAKE ONE TABLET BY MOUTH AT BEDTIME 90 tablet 3  . azelastine (ASTELIN) 0.1 % nasal spray Place 2 sprays into both nostrils at bedtime as needed for rhinitis or allergies. 30 mL 12  . esomeprazole (NEXIUM) 40 MG capsule Take 1 capsule (40 mg total) by mouth daily. 30 capsule 5  . ezetimibe (ZETIA) 10 MG tablet TAKE 1 TABLET BY MOUTH EVERY DAY 90 tablet 3  . fenofibrate 160 MG tablet Take 1 tablet (160 mg total) by mouth daily. 90 tablet 0  . folic acid (FOLVITE) 1 MG tablet TAKE 2 TABLETS BY MOUTH EVERY DAY 180 tablet 4  . FREESTYLE LITE test strip USE TO TEST BLOOD SUGAR ONCE A DAY. DX CODE: E11.9 100 strip 12  . glimepiride (AMARYL) 1 MG tablet TAKE 1 TABLET (1 MG TOTAL) BY MOUTH DAILY WITH BREAKFAST. 90 tablet 1  . hydrocortisone cream 1 % Apply 1 application topically 2 (two) times daily. 45 g 1  . Lancets (FREESTYLE) lancets USE TWICE A DAY TO CHECK BLOOD SUGAR. DX E11.9 100  each 6  . metFORMIN (GLUCOPHAGE) 1000 MG tablet TAKE 1 & 1/2 TABLETS BY MOUTH EVERY MORNING AND 1 TABLET IN THE EVENING. 225 tablet 1  . methylPREDNISolone (MEDROL DOSEPAK) 4 MG TBPK tablet Take as directed on the packaging. 21 each 0  . metoprolol tartrate (LOPRESSOR) 25 MG tablet TAKE ONE-HALF TABLET BY MOUTH TWICE DAILY 90 tablet 3  . nitroGLYCERIN (NITROSTAT) 0.4 MG SL tablet PLACE 1 TABLET (0.4 MG TOTAL) UNDER THE TONGUE EVERY 5 (FIVE) MINUTES AS NEEDED FOR CHEST PAIN. 25 tablet 6  . polycarbophil (FIBERCON) 625 MG tablet Take 625 mg by mouth daily.    . potassium chloride SA (KLOR-CON M20) 20 MEQ tablet Take 2 tablets (40 mEq total) by mouth 2 (two) times daily. 360 tablet 3  . pregabalin (LYRICA) 150 MG capsule TAKE 1 CAPSULE BY MOUTH TWICE A DAY 180 capsule 1  . psyllium (METAMUCIL) 58.6 % powder Take 1 packet by mouth daily as needed (constipation).     . torsemide (DEMADEX) 20 MG tablet Take 40 mg by mouth daily.    [name] VASCEPA 1 g capsule TAKE 2 CAPSULES BY MOUTH TWICE A DAY 120 capsule 5   No current facility-administered medications for this visit.   Facility-Administered Medications Ordered in Other Visits  Medication Dose Route Frequency Provider Last Rate Last Admin  . sodium chloride flush (NS) 0.9 % injection 10 mL  10 mL Intravenous PRN [name], MD   10 mL at 05/30/17 0920    Allergies:   Benazepril, Hctz [hydrochlorothiazide], Aspirin, and Lactose intolerance (gi)   ROS:  Please see the history of present illness.   Otherwise, review of systems are positive for none.   All other systems are reviewed and negative.    PHYSICAL EXAM: VS:  BP 124/70 (BP Location: Right Arm, Patient Position: Sitting, Cuff Size: Normal)   Pulse 75   Ht 6' (1.829 m)   Wt 244 lb 12.8 oz (111 kg)   BMI 33.20 kg/m  , BMI Body mass index is 33.2 kg/m. GENERAL:  Well appearing NECK:  No jugular venous distention, waveform within normal limits, carotid upstroke brisk and symmetric, no  bruits, no thyromegaly LUNGS:  Clear to auscultation bilaterally CHEST: Well-healed pacemaker pocket HEART:  PMI not displaced or sustained,S1 and S2 within normal limits, no S3, no S4, no clicks, no rubs, very brief apical systolic murmur radiating slightly at the LOWER tract murmurs ABD:  Flat, positive bowel sounds normal in frequency in pitch, no bruits, no rebound, no guarding, no midline pulsatile mass, no hepatomegaly, no splenomegaly EXT:  2 plus pulses throughout, left upper extremity edema to the axilla edema, no cyanosis no clubbing    EKG:  EKG is  ordered today. The ekg ordered sinus rhythm with ventricular pacing 100% capture.  Rate 75   Recent Labs: 11/20/2020: ALT 18; BUN 35; Creatinine 1.59; Hemoglobin 12.5; Platelet Count 203; Potassium 3.9; Sodium 139    Lipid Panel    Component Value Date/Time   CHOL 159 07/27/2020 0858   CHOL 159 01/25/2020 0819   TRIG (H) 07/27/2020 0858    839.0 Triglyceride is over 400; calculations on Lipids are invalid.   HDL 29.60 (L) 07/27/2020 0858   HDL 31 (L) 01/25/2020 0819   CHOLHDL 5 07/27/2020 0858   VLDL 44.0 (H) 11/24/2018 0815   LDLCALC  04/18/2020 0956     Comment:     . LDL cholesterol not calculated. Triglyceride levels greater than 400 mg/dL invalidate calculated LDL results. . Reference range: <100 . Desirable range <100 mg/dL for primary prevention;   <70 mg/dL for patients with CHD or diabetic patients  with > or = 2 CHD risk factors. [name] LDL-C is now calculated using the Martin-Hopkins  calculation, which is a validated novel method providing  better accuracy than the Friedewald equation in the  estimation of LDL-C.  [name] et al. [name]. 9390;300(92): 2061-2068  (http://education.[employer].com/faq/FAQ164)    LDLDIRECT 47.0 07/27/2020 0858      Wt Readings from Last 3 Encounters:  11/21/20 244 lb 12.8 oz (111 kg)  11/20/20 244 lb (110.7 kg)  10/30/20 244 lb (110.7 kg)      Other studies  Reviewed: Additional studies/ records that were reviewed today include:  CT, upper extremity ultrasound Review of the above records demonstrates:  Please see elsewhere in the note.     ASSESSMENT AND PLAN:   S/p TAVR:  He had normal function on echo last year and has no symptoms.  No change in therapy.   CADs/p CABG:  The patient has no new sypmtoms.  No further cardiovascular testing is indicated.  We will continue with aggressive risk reduction and meds as listed.  2-1 AV blocks/pPPM:I am going to move his appointment up in EP.  I would not strongly suspect his pacemaker involved in his extremity swelling but I would like him to be seen in person by Dr. Caryl Comes.  He has had negative imaging and he has been on anticoagulation.   Atrial flutter:   He tolerates anticoagulation and has had no symptomatic recurrence.  Hypertension:Blood pressure is at target.  No change in therapy.   Hyperlipidemia:   LDL is at target but his triglycerides remain high.  He did see Dr. Debara Pickett and no change in therapy was suggested.  He understands low carbohydrate diet.   DM 2:  A1c was 6.7 down from 7.0.  No change in therapy.    Ascending aortic aneurysm:    His aorta was only 36 mm on recent CT.  No change in therapy or follow up is needed.  Spoke with the radiologist to review this today from his most  recent CT.  Carotid stenosis:  This was bilateral less than 39% in June of last year.  No follow up is indicated at this time.    CKD IIIa: Creatinine was 1.39.  This can be followed   Current medicines are reviewed at length with the patient today.  The patient does not have concerns regarding medicines.  The following changes have been made: None  Labs/ tests ordered today include:    Orders Placed This Encounter  Procedures  . Lipid panel  . Hepatic function panel  . Hemoglobin A1c  . EKG 12-Lead     Disposition:   FU with me in 1 year.     Signed, Minus Breeding, MD   11/21/2020 4:03 PM    [facility]

## 2020-11-20 NOTE — Patient Instructions (Signed)

## 2020-11-20 NOTE — Progress Notes (Signed)
Hematology and Oncology Follow Up Visit  Richard [name] [id number] 06/27/1941 80 y.o. 11/20/2020   Principle Diagnosis:  Diffuse large cell non-Hodgkin's lymphoma (IPI = 3) - NOT "double hit" Pernicious anemia Iron deficiency secondary to bleeding  Past Therapy: R-CHOP - s/p cycle8 - completed 08/2017  Current Therapy: Vitamin B12 1 mg IM every month Xgeva 120 mg subcu q 3 months - next doseduein06/2022 IV ironas indicated   Interim History:  Richard [name] is here today for follow-up and B 12 injections. [place] and has been enjoying this nice day.  He still has some swelling in the left arm. This looks a little better but he does not feel that the compression sleeve has helped much.  He states that he also got scratched by his cat on that same arm and has had some yellow drainage. He has kept this clean and bandaged. The drainage is a little better.  No fever, chills, n/v, cough, rash, dizziness, SOB, chest pain, palpitations, abdominal pain or changes in bowel or bladder habits.  No tenderness, numbness or tingling in his extremities at this time.  No falls or syncope.  She has maintained a good appetite and is staying well hydrated. His weight is stable.   ECOG Performance Status: 1 - Symptomatic but completely ambulatory  Medications:  Allergies as of 11/20/2020      Reactions   Benazepril Swelling   angioedema; he is not a candidate for any angiotensin receptor blockers because of this significant allergic reaction. Because of a history of documented adverse serious drug reaction;Medi Alert bracelet  is recommended   Hctz [hydrochlorothiazide] Anaphylaxis, Swelling   Tongue and lip swelling   Aspirin Other (See Comments)   Gastritis, cant take 325 Mg aspirin    Lactose Intolerance (gi) Nausea And Vomiting      Medication List       Accurate as of Nov 20, 2020  1:34 PM. If you have any questions, ask your nurse or doctor.         allopurinol 300 MG tablet Commonly known as: ZYLOPRIM Take 450 mg by mouth daily.   aluminum hydroxide-magnesium carbonate 95-358 MG/15ML Susp Commonly known as: GAVISCON Take 15 mLs by mouth as needed for indigestion or heartburn.   amLODipine 5 MG tablet Commonly known as: NORVASC TAKE 1 TABLET BY MOUTH TWICE A DAY   apixaban 2.5 MG Tabs tablet Commonly known as: Eliquis Take 1 tablet (2.5 mg total) by mouth 2 (two) times daily.   atorvastatin 80 MG tablet Commonly known as: LIPITOR TAKE ONE TABLET BY MOUTH AT BEDTIME   azelastine 0.1 % nasal spray Commonly known as: ASTELIN Place 2 sprays into both nostrils at bedtime as needed for rhinitis or allergies.   doxycycline 100 MG capsule Commonly known as: VIBRAMYCIN doxycycline hyclate 100 mg capsule  Take 1 capsule twice a day by oral route for 14 days.   doxycycline 100 MG capsule Commonly known as: VIBRAMYCIN Take 100 mg by mouth 2 (two) times daily.   esomeprazole 40 MG capsule Commonly known as: NexIUM Take 1 capsule (40 mg total) by mouth daily.   ezetimibe 10 MG tablet Commonly known as: ZETIA TAKE 1 TABLET BY MOUTH EVERY DAY   fenofibrate 160 MG tablet Take 1 tablet (160 mg total) by mouth daily.   folic acid 1 MG tablet Commonly known as: FOLVITE TAKE 2 TABLETS BY MOUTH EVERY DAY   freestyle lancets USE TWICE A DAY TO CHECK  BLOOD SUGAR. DX E11.9   FREESTYLE LITE test strip Generic drug: glucose blood USE TO TEST BLOOD SUGAR ONCE A DAY. DX CODE: E11.9   glimepiride 1 MG tablet Commonly known as: AMARYL TAKE 1 TABLET (1 MG TOTAL) BY MOUTH DAILY WITH BREAKFAST.   hydrocortisone cream 1 % Apply 1 application topically 2 (two) times daily.   metFORMIN 1000 MG tablet Commonly known as: GLUCOPHAGE TAKE 1 & 1/2 TABLETS BY MOUTH EVERY MORNING AND 1 TABLET IN THE EVENING.   methylPREDNISolone 4 MG Tbpk tablet Commonly known as: MEDROL DOSEPAK Take as directed on the packaging.   metoprolol  tartrate 25 MG tablet Commonly known as: LOPRESSOR TAKE ONE-HALF TABLET BY MOUTH TWICE DAILY   nitroGLYCERIN 0.4 MG SL tablet Commonly known as: NITROSTAT PLACE 1 TABLET (0.4 MG TOTAL) UNDER THE TONGUE EVERY 5 (FIVE) MINUTES AS NEEDED FOR CHEST PAIN.   polycarbophil 625 MG tablet Commonly known as: FIBERCON Take 625 mg by mouth daily.   potassium chloride SA 20 MEQ tablet Commonly known as: [place]-Con M20 Take 2 tablets (40 mEq total) by mouth 2 (two) times daily.   pregabalin 150 MG capsule Commonly known as: LYRICA TAKE 1 CAPSULE BY MOUTH TWICE A DAY   psyllium 58.6 % powder Commonly known as: METAMUCIL Take 1 packet by mouth daily as needed (constipation).   torsemide 20 MG tablet Commonly known as: DEMADEX Take 40 mg by mouth daily.   Tylenol 325 MG Caps Generic drug: Acetaminophen Tylenol   Vascepa 1 g capsule Generic drug: icosapent Ethyl TAKE 2 CAPSULES BY MOUTH TWICE A DAY       Allergies:  Allergies  Allergen Reactions  . Benazepril Swelling    angioedema; he is not a candidate for any angiotensin receptor blockers because of this significant allergic reaction. Because of a history of documented adverse serious drug reaction;Medi Alert bracelet  is recommended  . Hctz [Hydrochlorothiazide] Anaphylaxis and Swelling    Tongue and lip swelling   . Aspirin Other (See Comments)    Gastritis, cant take 325 Mg aspirin   . Lactose Intolerance (Gi) Nausea And Vomiting    Past Medical History, Surgical history, Social history, and Family History were reviewed and updated.  Review of Systems: All other 10 point review of systems is negative.   Physical Exam:  vitals were not taken for this visit.   Wt Readings from Last 3 Encounters:  10/30/20 244 lb (110.7 kg)  10/20/20 238 lb 1.3 oz (108 kg)  10/05/20 248 lb (112.5 kg)    Ocular: Sclerae unicteric, pupils equal, round and reactive to light Ear-nose-throat: Oropharynx clear, dentition fair Lymphatic:  No cervical or supraclavicular adenopathy Lungs no rales or rhonchi, good excursion bilaterally Heart regular rate and rhythm, no murmur appreciated Abd soft, nontender, positive bowel sounds MSK no focal spinal tenderness, no joint edema Neuro: non-focal, well-oriented, appropriate affect Breasts: Deferred   Lab Results  Component Value Date   WBC 7.0 10/20/2020   HGB 12.0 (L) 10/20/2020   HCT 37.0 (L) 10/20/2020   MCV 89.4 10/20/2020   PLT 223 10/20/2020   Lab Results  Component Value Date   FERRITIN 1,137 (H) 10/20/2020   IRON 81 10/20/2020   TIBC 356 10/20/2020   UIBC 275 10/20/2020   IRONPCTSAT 23 10/20/2020   Lab Results  Component Value Date   RETICCTPCT 1.8 10/20/2020   RBC 4.18 (L) 10/20/2020   RETICCTABS 52.1 11/22/2011   No results found for: KPAFRELGTCHN, LAMBDASER, KAPLAMBRATIO Lab Results  Component Value Date   IGA 159 03/09/2012   Lab Results  Component Value Date   ALBUMINELP 4.2 07/27/2018   MSPIKE Not Observed 07/27/2018     Chemistry      Component Value Date/Time   NA 138 10/20/2020 0924   NA 136 03/16/2019 0938   NA 144 06/27/2017 0857   K 4.2 10/20/2020 0924   K 3.9 06/27/2017 0857   CL 102 10/20/2020 0924   CL 103 06/27/2017 0857   CO2 26 10/20/2020 0924   CO2 26 06/27/2017 0857   BUN 35 (H) 10/20/2020 0924   BUN 34 (H) 03/16/2019 0938   BUN 12 06/27/2017 0857   CREATININE 1.39 (H) 10/20/2020 0924   CREATININE 1.65 (H) 04/18/2020 0956      Component Value Date/Time   CALCIUM 10.6 (H) 10/20/2020 0924   CALCIUM 9.2 06/27/2017 0857   ALKPHOS 62 10/20/2020 0924   ALKPHOS 128 (H) 06/27/2017 0857   AST 15 10/20/2020 0924   ALT 15 10/20/2020 0924   ALT 24 06/27/2017 0857   BILITOT 0.4 10/20/2020 0924       Impression and Plan: Richard [name] is a very pleasant 80yo caucasian gentleman withdiffuse large B-cell lymphoma (not"double hit"lymphoma). He completed treatment in February 2019. B 12 given today.  Iron studies are  pending. We will replace if needed.  We will have him take Augmenting 875-125 mg PO BID for 7 days for the cat scratch and left arm swelling.  Follow-up in another month.  He can contact our office with any questions or concerns.   Laverna Peace, NP 5/23/20221:34 PM

## 2020-11-21 ENCOUNTER — Other Ambulatory Visit: Payer: Self-pay | Admitting: Family

## 2020-11-21 ENCOUNTER — Encounter: Payer: Self-pay | Admitting: Cardiology

## 2020-11-21 ENCOUNTER — Ambulatory Visit (INDEPENDENT_AMBULATORY_CARE_PROVIDER_SITE_OTHER): Payer: Medicare Other | Admitting: Cardiology

## 2020-11-21 ENCOUNTER — Encounter: Payer: Self-pay | Admitting: Family

## 2020-11-21 VITALS — BP 124/70 | HR 75 | Ht 72.0 in | Wt 244.8 lb

## 2020-11-21 DIAGNOSIS — E1165 Type 2 diabetes mellitus with hyperglycemia: Secondary | ICD-10-CM

## 2020-11-21 DIAGNOSIS — E1151 Type 2 diabetes mellitus with diabetic peripheral angiopathy without gangrene: Secondary | ICD-10-CM | POA: Diagnosis not present

## 2020-11-21 DIAGNOSIS — Z951 Presence of aortocoronary bypass graft: Secondary | ICD-10-CM | POA: Diagnosis not present

## 2020-11-21 DIAGNOSIS — I25810 Atherosclerosis of coronary artery bypass graft(s) without angina pectoris: Secondary | ICD-10-CM | POA: Diagnosis not present

## 2020-11-21 DIAGNOSIS — I712 Thoracic aortic aneurysm, without rupture: Secondary | ICD-10-CM

## 2020-11-21 DIAGNOSIS — E785 Hyperlipidemia, unspecified: Secondary | ICD-10-CM

## 2020-11-21 DIAGNOSIS — Z952 Presence of prosthetic heart valve: Secondary | ICD-10-CM

## 2020-11-21 DIAGNOSIS — Z950 Presence of cardiac pacemaker: Secondary | ICD-10-CM

## 2020-11-21 DIAGNOSIS — IMO0002 Reserved for concepts with insufficient information to code with codable children: Secondary | ICD-10-CM

## 2020-11-21 DIAGNOSIS — I4892 Unspecified atrial flutter: Secondary | ICD-10-CM

## 2020-11-21 DIAGNOSIS — I7121 Aneurysm of the ascending aorta, without rupture: Secondary | ICD-10-CM

## 2020-11-21 LAB — IRON AND TIBC
Iron: 51 ug/dL (ref 42–163)
Saturation Ratios: 13 % — ABNORMAL LOW (ref 20–55)
TIBC: 375 ug/dL (ref 202–409)
UIBC: 324 ug/dL (ref 117–376)

## 2020-11-21 LAB — FERRITIN: Ferritin: 1296 ng/mL — ABNORMAL HIGH (ref 24–336)

## 2020-11-21 NOTE — Patient Instructions (Signed)
Medication Instructions:  No Changes In Medications at this time.  *If you need a refill on your cardiac medications before your next appointment, please call your pharmacy*  Lab Work: [place] TO FAST PRIOR If you have labs (blood work) drawn today and your tests are completely normal, you will receive your results only by: [name] MyChart Message (if you have MyChart) OR . A paper copy in the mail If you have any lab test that is abnormal or we need to change your treatment, we will call you to review the results.  Follow-Up: At [facility], you and your health needs are our priority.  As part of our continuing mission to provide you with exceptional heart care, we have created designated Provider Care Teams.  These Care Teams include your primary Cardiologist (physician) and Advanced Practice Providers (APPs -  Physician Assistants and Nurse Practitioners) who all work together to provide you with the care you need, when you need it.  Your next appointment:   1 year(s)  The format for your next appointment:   In Person  Provider:   Minus Breeding, MD  Other Instructions MESSAGE SENT TO SCHEDULING TO GET YOUR EP APPOINTMENT MOVED UP SOONER- [place]. [phone]

## 2020-11-23 DIAGNOSIS — E785 Hyperlipidemia, unspecified: Secondary | ICD-10-CM | POA: Diagnosis not present

## 2020-11-23 DIAGNOSIS — I712 Thoracic aortic aneurysm, without rupture: Secondary | ICD-10-CM | POA: Diagnosis not present

## 2020-11-23 DIAGNOSIS — Z951 Presence of aortocoronary bypass graft: Secondary | ICD-10-CM | POA: Diagnosis not present

## 2020-11-23 DIAGNOSIS — Z952 Presence of prosthetic heart valve: Secondary | ICD-10-CM | POA: Diagnosis not present

## 2020-11-23 DIAGNOSIS — E1151 Type 2 diabetes mellitus with diabetic peripheral angiopathy without gangrene: Secondary | ICD-10-CM | POA: Diagnosis not present

## 2020-11-23 DIAGNOSIS — E1165 Type 2 diabetes mellitus with hyperglycemia: Secondary | ICD-10-CM | POA: Diagnosis not present

## 2020-11-24 ENCOUNTER — Ambulatory Visit (INDEPENDENT_AMBULATORY_CARE_PROVIDER_SITE_OTHER): Payer: Medicare Other | Admitting: Internal Medicine

## 2020-11-24 ENCOUNTER — Other Ambulatory Visit: Payer: Self-pay

## 2020-11-24 ENCOUNTER — Encounter: Payer: Self-pay | Admitting: Internal Medicine

## 2020-11-24 VITALS — BP 130/66 | HR 68 | Ht 72.0 in | Wt 249.0 lb

## 2020-11-24 DIAGNOSIS — I25810 Atherosclerosis of coronary artery bypass graft(s) without angina pectoris: Secondary | ICD-10-CM

## 2020-11-24 DIAGNOSIS — I480 Paroxysmal atrial fibrillation: Secondary | ICD-10-CM

## 2020-11-24 DIAGNOSIS — Z950 Presence of cardiac pacemaker: Secondary | ICD-10-CM

## 2020-11-24 LAB — HEMOGLOBIN A1C
Est. average glucose Bld gHb Est-mCnc: 134 mg/dL
Hgb A1c MFr Bld: 6.3 % — ABNORMAL HIGH (ref 4.8–5.6)

## 2020-11-24 LAB — LIPID PANEL
Chol/HDL Ratio: 4.3 ratio (ref 0.0–5.0)
Cholesterol, Total: 162 mg/dL (ref 100–199)
HDL: 38 mg/dL — ABNORMAL LOW (ref >39)
LDL Chol Calc (NIH): 89 mg/dL (ref 0–99)
Triglycerides: 206 mg/dL — ABNORMAL HIGH (ref 0–149)
VLDL Cholesterol Cal: 35 mg/dL (ref 5–40)

## 2020-11-24 LAB — HEPATIC FUNCTION PANEL
ALT: 21 IU/L (ref 0–44)
AST: 17 IU/L (ref 0–40)
Albumin: 4.4 g/dL (ref 3.6–4.6)
Alkaline Phosphatase: 63 IU/L (ref 44–121)
Bilirubin Total: 0.3 mg/dL (ref 0.0–1.2)
Bilirubin, Direct: 0.12 mg/dL (ref 0.00–0.40)
Total Protein: 6.5 g/dL (ref 6.0–8.5)

## 2020-11-24 NOTE — Patient Instructions (Signed)
Medication Instructions:  Your physician recommends that you continue on your current medications as directed. Please refer to the Current Medication list given to you today.  *If you need a refill on your cardiac medications before your next appointment, please call your pharmacy*   Lab Work: None ordered.  If you have labs (blood work) drawn today and your tests are completely normal, you will receive your results only by: MyChart Message (if you have MyChart) OR A paper copy in the mail If you have any lab test that is abnormal or we need to change your treatment, we will call you to review the results.   Testing/Procedures: None ordered.    Follow-Up: At [facility], you and your health needs are our priority.  As part of our continuing mission to provide you with exceptional heart care, we have created designated Provider Care Teams.  These Care Teams include your primary Cardiologist (physician) and Advanced Practice Providers (APPs -  Physician Assistants and Nurse Practitioners) who all work together to provide you with the care you need, when you need it.  We recommend signing up for the patient portal called "MyChart".  Sign up information is provided on this After Visit Summary.  MyChart is used to connect with patients for Virtual Visits (Telemedicine).  Patients are able to view lab/test results, encounter notes, upcoming appointments, etc.  Non-urgent messages can be sent to your provider as well.   To learn more about what you can do with MyChart, go to [email or web address].    Your next appointment:   6 month(s)  The format for your next appointment:   In Person  Provider:   Steven Klein, MD    

## 2020-11-24 NOTE — Progress Notes (Signed)
Patient Care Team: Carollee Herter, Alferd Apa, DO as PCP - General (Family Medicine) Minus Breeding, MD as PCP - Cardiology (Cardiology) Deboraha Sprang, MD as PCP - Electrophysiology (Cardiology) Dingeldein, Remo Lipps, MD (Ophthalmology) Rigoberto Noel, MD as Consulting Physician (Pulmonary Disease) [name], MD as Consulting Physician (Oncology) Minus Breeding, MD as Consulting Physician (Cardiology) Parrett, Fonnie Mu, NP as Nurse Practitioner (Pulmonary Disease) [facility] (Dermatology) Latanya Maudlin, MD as Consulting Physician (Orthopedic Surgery) [facility] [place], [facility] (Pharmacist)   HPI  Richard [name] is a 80 y.o. male seen in follow-up for TAVR complicated by heart block requiring pacing for which he received a Medtronic His bundle device (3/19) .  He has permanent atrial fibrillation and is anticoagulated with Apixoban  *and has a history of coronary artery disease with prior bypass surgery  The patient denies, nocturnal dyspnea, orthopnea, peripheral edema.  There have been no palpitations, lightheadedness or syncope.    Heart pacemaker undergoing in 2019.  Participates in golf daily-no exertional shortness of breath, chest pain, tightness, or pressure.   There were device changes made 3/22 which were poorly tolerated.  Return to his previous programming and has felt good.  Compliant to use of CPAP secondary to sleep apnea. He is tolerating this well, sleeping 7-8 hours nightly.    Records and Results Reviewed   Past Medical History:  Diagnosis Date  . Anemia   . Arthritis    hips  . Axillary adenopathy 02/25/2017  . Bradycardia    a. holter monitor has demonstrated HRs in 30s and [place]   . CAD (coronary artery disease)    a. s/p CABG 2001  b.  07/28/2017 cath:   Severe three-vessel native CAD with total occlusion of LAD, ramus intermedius, first OM and RCA, patent RIMA to PDA, LIMA to LAD, sequential SVG to ramus  intermedius and first OM.    [name] Chronic lower back pain   . Diffuse large B cell lymphoma ([place])   . Diverticulosis   . Esophageal stricture   . GERD (gastroesophageal reflux disease)   . History of gout   . HTN (hypertension)   . Mixed hyperlipidemia   . OSA on CPAP    with 2L O2 at night  . Pancytopenia ([place])    a. related to chemo therapy for B cell lymphoma  . Peptic stricture of esophagus   . Presence of permanent cardiac pacemaker    sees Dr. Valaria Good pacemaker  . Severe aortic stenosis   . Spinal stenosis   . Squamous cell carcinoma of skin 03/22/2009   Right mandible. SCCis, hypertrophic.   . Type II diabetes mellitus ([place])   . Wears dentures    partial upper    Past Surgical History:  Procedure Laterality Date  . APPENDECTOMY  ~ 1952  . BACK SURGERY    . CATARACT EXTRACTION W/PHACO Left 11/06/2016   Procedure: CATARACT EXTRACTION PHACO AND INTRAOCULAR LENS PLACEMENT (IOC);  Surgeon: Estill Cotta, MD;  Location: [facility];  Service: Ophthalmology;  Laterality: Left;  Lot # [id number] H [place]: 01:09.4 AP%:25.2 CDE: 30.64  . CATARACT EXTRACTION W/PHACO Right 12/04/2016   Procedure: CATARACT EXTRACTION PHACO AND INTRAOCULAR LENS PLACEMENT (IOC);  Surgeon: Estill Cotta, MD;  Location: [facility];  Service: Ophthalmology;  Laterality: Right;  [place] 1:25.9 AP% 24.1 CDE 39.10 Fluid Pack lot # [id number]  . COLONOSCOPY W/ BIOPSIES AND POLYPECTOMY  2013  . CORONARY ANGIOPLASTY  1993  . CORONARY ANGIOPLASTY  WITH STENT PLACEMENT  05/1997   "1"  . CORONARY ARTERY BYPASS GRAFT  03/2000   "CABG X5"  . ECTROPION REPAIR Right 09/01/2018   Procedure: REPAIR OF ECTROPION BILATERAL upper and lower;  Surgeon: Karle Starch, MD;  Location: [place];  Service: Ophthalmology;  Laterality: Right;  Diabetic - oral meds sleep apnea  . ESOPHAGEAL DILATION  X 3-4   Dr. Lyla Son; "last one was in the 1990's"  . ESOPHAGOGASTRODUODENOSCOPY     multiple  . FLEXIBLE SIGMOIDOSCOPY      multiple  . HYDRADENITIS EXCISION Left 02/25/2017   Procedure: EXCISION DEEP LEFT AXILLARY LYMPH NODE;  Surgeon: Fanny Skates, MD;  Location: [place];  Service: General;  Laterality: Left;  . INTRAOPERATIVE TRANSTHORACIC ECHOCARDIOGRAM N/A 09/09/2017   Procedure: INTRAOPERATIVE TRANSTHORACIC ECHOCARDIOGRAM;  Surgeon: Burnell Blanks, MD;  Location: [place];  Service: Open Heart Surgery;  Laterality: N/A;  . KNEE ARTHROSCOPY Left 2011   meniscus repair  . LEFT HEART CATHETERIZATION WITH CORONARY/GRAFT ANGIOGRAM N/A 03/16/2014   Procedure: LEFT HEART CATHETERIZATION WITH Beatrix Fetters;  Surgeon: Burnell Blanks, MD;  Location: [facility] C[facility];  Service: Cardiovascular;  Laterality: N/A;  . LUMBAR LAMINECTOMY/DECOMPRESSION MICRODISCECTOMY Right 06/17/2013   Procedure: LUMBAR LAMINECTOMY MICRODISCECTOMY L4-L5 RIGHT EXCISION OF SYNOVIAL CYST RIGHT   (1 LEVEL) RIGHT PARTIAL FACETECTOMY;  Surgeon: Tobi Bastos, MD;  Location: [facility];  Service: Orthopedics;  Laterality: Right;  . MYELOGRAM  04/06/13   lumbar, Dr Gladstone Lighter  . ORBITAL LESION EXCISION Right 09/01/2018   Procedure: ORBITOTOMY WITHOUT BONE FLAP WITH REMOVAL OF LESION RIGHT;  Surgeon: Karle Starch, MD;  Location: [place];  Service: Ophthalmology;  Laterality: Right;  . PACEMAKER IMPLANT N/A 09/10/2017   Procedure: PACEMAKER IMPLANT;  Surgeon: Deboraha Sprang, MD;  Location: [place] CV LAB;  Service: Cardiovascular;  Laterality: N/A;  . PANENDOSCOPY    . PORTACATH PLACEMENT N/A 03/06/2017   Procedure: INSERTION PORT-A-CATH AND ASPIRATE SEROMA LEFT AXILLA;  Surgeon: Fanny Skates, MD;  Location: [place];  Service: General;  Laterality: N/A;  . RIGHT/LEFT HEART CATH AND CORONARY/GRAFT ANGIOGRAPHY N/A 07/28/2017   Procedure: RIGHT/LEFT HEART CATH AND CORONARY/GRAFT ANGIOGRAPHY;  Surgeon: Sherren Mocha, MD;  Location: [place] CV LAB;  Service: Cardiovascular;  Laterality: N/A;  . SHOULDER SURGERY Right  08/2010   screws placed; "tendons tore off"  . SKIN CANCER EXCISION Right    "neck"  . TONSILLECTOMY  ~ 1954  . TOTAL KNEE ARTHROPLASTY Left 06/29/2019   Procedure: TOTAL KNEE ARTHROPLASTY;  Surgeon: Paralee Cancel, MD;  Location: [facility];  Service: Orthopedics;  Laterality: Left;  70 mins  . TRANSCATHETER AORTIC VALVE REPLACEMENT, TRANSFEMORAL N/A 09/09/2017   Procedure: TRANSCATHETER AORTIC VALVE REPLACEMENT, TRANSFEMORAL;  Surgeon: Burnell Blanks, MD;  Location: [place];  Service: Open Heart Surgery;  Laterality: N/A;  . UPPER GI ENDOSCOPY  2013   Gastritis; Dr Carlean Purl  . VASECTOMY      Current Meds  Medication Sig  . Acetaminophen (TYLENOL) 325 MG CAPS Tylenol  . allopurinol (ZYLOPRIM) 300 MG tablet Take 450 mg by mouth daily.  [name] aluminum hydroxide-magnesium carbonate (GAVISCON) 95-358 MG/15ML SUSP Take 15 mLs by mouth as needed for indigestion or heartburn.  [name] amLODipine (NORVASC) 5 MG tablet TAKE 1 TABLET BY MOUTH TWICE A DAY  . amoxicillin-clavulanate (AUGMENTIN) 875-125 MG tablet Take 1 tablet by mouth 2 (two) times daily.  [name] apixaban (ELIQUIS) 2.5 MG TABS tablet Take 1 tablet (2.5 mg total)  by mouth 2 (two) times daily.  [name] atorvastatin (LIPITOR) 80 MG tablet TAKE ONE TABLET BY MOUTH AT BEDTIME  . azelastine (ASTELIN) 0.1 % nasal spray Place 2 sprays into both nostrils at bedtime as needed for rhinitis or allergies.  [name] esomeprazole (NEXIUM) 40 MG capsule Take 1 capsule (40 mg total) by mouth daily.  [name] ezetimibe (ZETIA) 10 MG tablet TAKE 1 TABLET BY MOUTH EVERY DAY  . fenofibrate 160 MG tablet Take 1 tablet (160 mg total) by mouth daily.  . folic acid (FOLVITE) 1 MG tablet TAKE 2 TABLETS BY MOUTH EVERY DAY  . FREESTYLE LITE test strip USE TO TEST BLOOD SUGAR ONCE A DAY. DX CODE: E11.9  . glimepiride (AMARYL) 1 MG tablet TAKE 1 TABLET (1 MG TOTAL) BY MOUTH DAILY WITH BREAKFAST.  . hydrocortisone cream 1 % Apply 1 application topically 2 (two) times daily.  . Lancets  (FREESTYLE) lancets USE TWICE A DAY TO CHECK BLOOD SUGAR. DX E11.9  . metFORMIN (GLUCOPHAGE) 1000 MG tablet TAKE 1 & 1/2 TABLETS BY MOUTH EVERY MORNING AND 1 TABLET IN THE EVENING.  . methylPREDNISolone (MEDROL DOSEPAK) 4 MG TBPK tablet Take as directed on the packaging.  . metoprolol tartrate (LOPRESSOR) 25 MG tablet TAKE ONE-HALF TABLET BY MOUTH TWICE DAILY  . nitroGLYCERIN (NITROSTAT) 0.4 MG SL tablet PLACE 1 TABLET (0.4 MG TOTAL) UNDER THE TONGUE EVERY 5 (FIVE) MINUTES AS NEEDED FOR CHEST PAIN.  [name] polycarbophil (FIBERCON) 625 MG tablet Take 625 mg by mouth daily.  . potassium chloride SA (KLOR-CON M20) 20 MEQ tablet Take 2 tablets (40 mEq total) by mouth 2 (two) times daily.  . pregabalin (LYRICA) 150 MG capsule TAKE 1 CAPSULE BY MOUTH TWICE A DAY  . psyllium (METAMUCIL) 58.6 % powder Take 1 packet by mouth daily as needed (constipation).   . torsemide (DEMADEX) 20 MG tablet Take 40 mg by mouth daily.  [name] VASCEPA 1 g capsule TAKE 2 CAPSULES BY MOUTH TWICE A DAY    Allergies  Allergen Reactions  . Benazepril Swelling    angioedema; he is not a candidate for any angiotensin receptor blockers because of this significant allergic reaction. Because of a history of documented adverse serious drug reaction;Medi Alert bracelet  is recommended  . Hctz [Hydrochlorothiazide] Anaphylaxis and Swelling    Tongue and lip swelling   . Aspirin Other (See Comments)    Gastritis, cant take 325 Mg aspirin   . Lactose Intolerance (Gi) Nausea And Vomiting   Review of Systems negative except from HPI and PMH  Physical Exam BP 130/66   Pulse 68   Ht 6' (1.829 m)   Wt 249 lb (112.9 kg)   SpO2 98%   BMI 33.77 kg/m  Well developed and well nourished in no acute distress HENT normal Neck supple with JVP-flat Clear Device pocket well healed; without hematoma or erythema.  There is no tethering  Regular rate and rhythm, no gallop, No murmur Abd-soft with active BS No Clubbing cyanosis, no  edema Skin-warm and dry A & Oriented  Grossly normal sensory and motor function  EC sinus with P synchronous pacing Interval 17/15/42   Estimated Creatinine Clearance: 46.5 mL/min (A) (by C-G formula based on SCr of 1.59 mg/dL (H)).   Assessment and  Plan Aortic stenosis status post TAVR   Atrial Fibrillation permanent   Pacemaker -Medtronic His bundle   Intemittent complete heart block  CAD s/p CABG  OSA on CPAP  Non Hodgkins Lymphoma Diffuse  chemos Cx 2019  Continues to do very well.  Especially following device returning to previous programming. Pacing threshold was high and the device was reprogrammed with improve longevity No bleeding on his Eliquis.  We will continue Appropriately dosed 2.5 given age and renal function  Pt heart failure status is stable. Continue Demadex at 40 mg daily dose.  Electrolytes are stable.  Without symptoms of ischemia.  Continue atorvastatin at 80 metoprolol 12.5 twice daily,  and not on antiplatelet therapy because of Eliquis    Current medicines are reviewed at length with the patient today .  The patient does not have concerns regarding medicines.   I,Alexis Bryant,acting as a scribe for [name], MD.,have documented all relevant documentation on the behalf of [name], MD,as directed by  [name], MD while in the presence of [name], MD.  I, [name], MD, have reviewed all documentation for this visit. The documentation on 11/24/20 for the exam, diagnosis, procedures, and orders are all accurate and complete.

## 2020-12-01 ENCOUNTER — Encounter: Payer: Self-pay | Admitting: *Deleted

## 2020-12-01 ENCOUNTER — Inpatient Hospital Stay: Payer: Medicare Other | Attending: Hematology & Oncology

## 2020-12-01 ENCOUNTER — Other Ambulatory Visit: Payer: Self-pay

## 2020-12-01 VITALS — BP 115/56 | HR 63 | Temp 98.8°F | Resp 17

## 2020-12-01 DIAGNOSIS — C8338 Diffuse large B-cell lymphoma, lymph nodes of multiple sites: Secondary | ICD-10-CM | POA: Diagnosis not present

## 2020-12-01 DIAGNOSIS — D500 Iron deficiency anemia secondary to blood loss (chronic): Secondary | ICD-10-CM | POA: Insufficient documentation

## 2020-12-01 DIAGNOSIS — D519 Vitamin B12 deficiency anemia, unspecified: Secondary | ICD-10-CM

## 2020-12-01 DIAGNOSIS — G620 Drug-induced polyneuropathy: Secondary | ICD-10-CM | POA: Diagnosis not present

## 2020-12-01 DIAGNOSIS — M7989 Other specified soft tissue disorders: Secondary | ICD-10-CM | POA: Insufficient documentation

## 2020-12-01 DIAGNOSIS — D508 Other iron deficiency anemias: Secondary | ICD-10-CM

## 2020-12-01 DIAGNOSIS — D510 Vitamin B12 deficiency anemia due to intrinsic factor deficiency: Secondary | ICD-10-CM | POA: Diagnosis not present

## 2020-12-01 MED ORDER — SODIUM CHLORIDE 0.9% FLUSH
10.0000 mL | INTRAVENOUS | Status: DC | PRN
  Administered 2020-12-01: 10 mL via INTRAVENOUS
  Filled 2020-12-01: qty 10

## 2020-12-01 MED ORDER — SODIUM CHLORIDE 0.9 % IV SOLN
INTRAVENOUS | Status: DC
  Filled 2020-12-01: qty 250

## 2020-12-01 MED ORDER — HEPARIN SOD (PORK) LOCK FLUSH 100 UNIT/ML IV SOLN
500.0000 [IU] | Freq: Once | INTRAVENOUS | Status: AC
  Administered 2020-12-01: 500 [IU] via INTRAVENOUS
  Filled 2020-12-01: qty 5

## 2020-12-01 MED ORDER — SODIUM CHLORIDE 0.9 % IV SOLN
510.0000 mg | Freq: Once | INTRAVENOUS | Status: AC
  Administered 2020-12-01: 510 mg via INTRAVENOUS
  Filled 2020-12-01: qty 510

## 2020-12-01 NOTE — Progress Notes (Signed)
Pt. refused to wait 30 min post infusion. Released stable and ASX. 

## 2020-12-01 NOTE — Patient Instructions (Signed)
Ferumoxytol injection What is this medicine? FERUMOXYTOL is an iron complex. Iron is used to make healthy red blood cells, which carry oxygen and nutrients throughout the body. This medicine is used to treat iron deficiency anemia. This medicine may be used for other purposes; ask your health care provider or pharmacist if you have questions. COMMON BRAND NAME(S): Feraheme What should I tell my health care provider before I take this medicine? They need to know if you have any of these conditions:  anemia not caused by low iron levels  high levels of iron in the blood  magnetic resonance imaging (MRI) test scheduled  an unusual or allergic reaction to iron, other medicines, foods, dyes, or preservatives  pregnant or trying to get pregnant  breast-feeding How should I use this medicine? This medicine is for injection into a vein. It is given by a health care professional in a hospital or clinic setting. Talk to your pediatrician regarding the use of this medicine in children. Special care may be needed. Overdosage: If you think you have taken too much of this medicine contact a poison control center or emergency room at once. NOTE: This medicine is only for you. Do not share this medicine with others. What if I miss a dose? It is important not to miss your dose. Call your doctor or health care professional if you are unable to keep an appointment. What may interact with this medicine? This medicine may interact with the following medications:  other iron products This list may not describe all possible interactions. Give your health care provider a list of all the medicines, herbs, non-prescription drugs, or dietary supplements you use. Also tell them if you smoke, drink alcohol, or use illegal drugs. Some items may interact with your medicine. What should I watch for while using this medicine? Visit your doctor or healthcare professional regularly. Tell your doctor or healthcare  professional if your symptoms do not start to get better or if they get worse. You may need blood work done while you are taking this medicine. You may need to follow a special diet. Talk to your doctor. Foods that contain iron include: whole grains/cereals, dried fruits, beans, or peas, leafy green vegetables, and organ meats (liver, kidney). What side effects may I notice from receiving this medicine? Side effects that you should report to your doctor or health care professional as soon as possible:  allergic reactions like skin rash, itching or hives, swelling of the face, lips, or tongue  breathing problems  changes in blood pressure  feeling faint or lightheaded, falls  fever or chills  flushing, sweating, or hot feelings  swelling of the ankles or feet Side effects that usually do not require medical attention (report to your doctor or health care professional if they continue or are bothersome):  diarrhea  headache  nausea, vomiting  stomach pain This list may not describe all possible side effects. Call your doctor for medical advice about side effects. You may report side effects to FDA at 1-800-FDA-1088. Where should I keep my medicine? This drug is given in a hospital or clinic and will not be stored at home. NOTE: This sheet is a summary. It may not cover all possible information. If you have questions about this medicine, talk to your doctor, pharmacist, or health care provider.  2021 Elsevier/Gold Standard (2016-08-05 20:21:10)  

## 2020-12-03 ENCOUNTER — Other Ambulatory Visit: Payer: Self-pay | Admitting: Family Medicine

## 2020-12-03 DIAGNOSIS — E781 Pure hyperglyceridemia: Secondary | ICD-10-CM

## 2020-12-09 ENCOUNTER — Other Ambulatory Visit: Payer: Self-pay | Admitting: Family Medicine

## 2020-12-09 DIAGNOSIS — E11649 Type 2 diabetes mellitus with hypoglycemia without coma: Secondary | ICD-10-CM

## 2020-12-12 DIAGNOSIS — M25512 Pain in left shoulder: Secondary | ICD-10-CM | POA: Diagnosis not present

## 2020-12-12 DIAGNOSIS — R2232 Localized swelling, mass and lump, left upper limb: Secondary | ICD-10-CM | POA: Diagnosis not present

## 2020-12-15 ENCOUNTER — Ambulatory Visit (INDEPENDENT_AMBULATORY_CARE_PROVIDER_SITE_OTHER): Payer: Medicare Other | Admitting: Pharmacist

## 2020-12-15 DIAGNOSIS — E1142 Type 2 diabetes mellitus with diabetic polyneuropathy: Secondary | ICD-10-CM

## 2020-12-15 DIAGNOSIS — N183 Chronic kidney disease, stage 3 unspecified: Secondary | ICD-10-CM

## 2020-12-15 DIAGNOSIS — E781 Pure hyperglyceridemia: Secondary | ICD-10-CM

## 2020-12-15 DIAGNOSIS — I129 Hypertensive chronic kidney disease with stage 1 through stage 4 chronic kidney disease, or unspecified chronic kidney disease: Secondary | ICD-10-CM | POA: Diagnosis not present

## 2020-12-15 DIAGNOSIS — E1122 Type 2 diabetes mellitus with diabetic chronic kidney disease: Secondary | ICD-10-CM

## 2020-12-15 DIAGNOSIS — I25118 Atherosclerotic heart disease of native coronary artery with other forms of angina pectoris: Secondary | ICD-10-CM

## 2020-12-15 DIAGNOSIS — E785 Hyperlipidemia, unspecified: Secondary | ICD-10-CM | POA: Diagnosis not present

## 2020-12-19 ENCOUNTER — Other Ambulatory Visit: Payer: Self-pay

## 2020-12-19 ENCOUNTER — Ambulatory Visit (INDEPENDENT_AMBULATORY_CARE_PROVIDER_SITE_OTHER): Payer: Medicare Other | Admitting: Podiatry

## 2020-12-19 ENCOUNTER — Encounter: Payer: Self-pay | Admitting: Podiatry

## 2020-12-19 DIAGNOSIS — M79674 Pain in right toe(s): Secondary | ICD-10-CM

## 2020-12-19 DIAGNOSIS — M79675 Pain in left toe(s): Secondary | ICD-10-CM | POA: Diagnosis not present

## 2020-12-19 DIAGNOSIS — B351 Tinea unguium: Secondary | ICD-10-CM | POA: Diagnosis not present

## 2020-12-19 DIAGNOSIS — E1142 Type 2 diabetes mellitus with diabetic polyneuropathy: Secondary | ICD-10-CM

## 2020-12-19 DIAGNOSIS — Q828 Other specified congenital malformations of skin: Secondary | ICD-10-CM

## 2020-12-19 NOTE — Patient Instructions (Signed)
Visit Information  PATIENT GOALS:  Goals Addressed             This Visit's Progress    Chronic Care Management Pharmacy Care Plan   On track    CARE PLAN ENTRY (see longitudinal plan of care for additional care plan information)  Current Barriers:  Chronic Disease Management support, education, and care coordination needs related to Hypertension, Hyperlipidemia/CAD, Diabetes, AFib, Heart Failure, Neuropathy, Gout, GERD, Allergic Rhinitis   Hypertension / Heart BP Readings from Last 3 Encounters:  12/01/20 (!) 115/56  11/24/20 130/66  11/21/20 124/70  Pharmacist Clinical Goal(s): Over the next 90 days, patient will work with PharmD and providers to maintain BP goal <130/80 and minimize symptoms of heart failure like swelling and shortness of breath Current regimen:  Amlodipine 5mg  daily  Metoprolol Tartrate 25mg  - take 0.5 tablet (= 12.5 mg) twice a day toresmide 20mg  - take 2 tablets = 40mg  daily  Potassium chloride 20 mEq - take 2 tablets = 16mEq twice a day Interventions: Discussed blood pressure goal, diet, and exercise Check weight daily  Patient self care activities - Over the next 90 days, patient will: Check blood pressure 1 to 2 times per week, document, and provide at future appointments Check weight daily and report if you gain more than 3 pounds in 24 hours or 5 pounds in 1 week.  Ensure daily salt intake < 2300 mg/day  Hyperlipidemia/CAD Lipid Panel     Component Value Date/Time   CHOL 162 11/23/2020 0828   TRIG 206 (H) 11/23/2020 0828   HDL 38 (L) 11/23/2020 0828   CHOLHDL 4.3 11/23/2020 0828   LDLCALC 89 11/23/2020 0828   LABVLDL 35 11/23/2020 0828  Pharmacist Clinical Goal(s): Over the next 180 days, patient will work with PharmD and providers to achieve LDL goal < 70 and TG <150 Current regimen:  Atorvastatin 80mg  daily Ezetimibe 10mg  daily Fenofibrate 160mg  daily Vascepa 1g - take 2 capsules twice a day  Nitroglycerin 0.4mg  as  needed Interventions: Discussed diet and exercise Discussed risk associated with elevated triglycerides including pancreatitis  Patient self care activities - Over the next 90 days, patient will: Maintain cholesterol medication regimen.  Triglycerides are much improved! Continue with exercise and recent dietary changes.   Diabetes Lab Results  Component Value Date/Time   HGBA1C 6.3 (H) 11/23/2020 08:28 AM   HGBA1C 6.7 (H) 07/27/2020 08:58 AM  Pharmacist Clinical Goal(s): Over the next 90 days, patient will work with PharmD and providers to achieve A1c goal <7% Current regimen:  Glimepiride 1mg  daily  Metformin 1000mg  1.5 tablets each morning with food and 1 tablet each evening with food Interventions: Discussed diet and exercise Patient self care activities - Over the next 90 days, patient will: Check blood sugar once daily, document, and provide at future appointments Contact provider with any episodes of hypoglycemia  Medication management Pharmacist Clinical Goal(s): Over the next 90 days, patient will work with PharmD and providers to maintain optimal medication adherence Current pharmacy: [facility] Interventions Comprehensive medication review performed. Continue current medication management strategy Patient self care activities - Over the next 90 days, patient will: Focus on medication adherence by filling and taking medications appropriately  Take medications as prescribed Report any questions or concerns to PharmD and/or provider(s)  Please see past updates related to this goal by clicking on the "Past Updates" button in the selected goal           Patient verbalizes understanding of instructions provided today and agrees  to view in [place].   Telephone follow up appointment with care management team member scheduled for: 3 to 4 months  [name], PharmD Clinical Pharmacist [place] [facility] [phone]

## 2020-12-19 NOTE — Progress Notes (Signed)
This patient returns to my office for at risk foot care.  This patient requires this care by a professional since this patient will be at risk due to having diabetic neuropathy  and coagulation defect.  Patient is taking eliquis. This patient is unable to cut nails himself since the patient cannot reach his nails.These nails are painful walking and wearing shoes.  Patient says the callus on his right forefoot has become painful when walking.  This patient presents for at risk foot care today.  General Appearance  Alert, conversant and in no acute stress.  Vascular  Dorsalis pedis and posterior tibial  pulses are palpable  bilaterally.  Capillary return is within normal limits  bilaterally. Temperature is within normal limits  bilaterally.  Neurologic  Senn-Weinstein monofilament wire test within normal limits  Left foot.  LOPS right foot is absent.  . Muscle power within normal limits bilaterally.  Nails Thick disfigured discolored nails with subungual debris  from hallux to fifth toes bilaterally. No evidence of bacterial infection or drainage bilaterally.  Orthopedic  No limitations of motion  feet .  No crepitus or effusions noted.  No bony pathology or digital deformities noted.  Plantar flexed fifth metatarsal right foot.  Skin  normotropic skin with noted bilaterally.  No signs of infections or ulcers noted.   Symptomatic porokeratosis  Right foot.  Onychomycosis  Pain in right toes  Pain in left toes  Porokeratosis sub 5th right  Consent was obtained for treatment procedures.   Mechanical debridement of nails 1-5  bilaterally performed with a nail nipper.  Filed with dremel without incident. Debride with # 15 blade.  Padding dispensed.  Consider surgtery for fifth met resection.   Return office visit   12 weeks.                  Told patient to return for periodic foot care and evaluation due to potential at risk complications.   Gardiner Barefoot DPM

## 2020-12-19 NOTE — Chronic Care Management (AMB) (Signed)
Chronic Care Management Pharmacy Note  12/19/2020 Name:  Richard [name] MRN:  [id number] DOB:  1941/04/10  Summary: Was started on Farxiga for CKD, HF and type 2 DM but has been unable to afford  Recommendations/Changes made from today's visit: Assessed for patient assistance program for Farxiga but patient does not meet income limit.   Plan: Patient to discuss with nephrologist who started Farxiga  Subjective: Richard [name] is an 80 y.o. year old male who is a primary patient of [name], DO.  The CCM team was consulted for assistance with disease management and care coordination needs.    Engaged with patient by telephone for follow up visit in response to provider referral for pharmacy case management and/or care coordination services.   Consent to Services:  The patient was given information about Chronic Care Management services, agreed to services, and gave verbal consent prior to initiation of services.  Please see initial visit note for detailed documentation.   Patient Care Team: Carollee Herter, Alferd Apa, DO as PCP - General (Family Medicine) Minus Breeding, MD as PCP - Cardiology (Cardiology) Deboraha Sprang, MD as PCP - Electrophysiology (Cardiology) Dingeldein, Remo Lipps, MD (Ophthalmology) Rigoberto Noel, MD as Consulting Physician (Pulmonary Disease) [name], MD as Consulting Physician (Oncology) Minus Breeding, MD as Consulting Physician (Cardiology) Parrett, Fonnie Mu, NP as Nurse Practitioner (Pulmonary Disease) [facility] (Dermatology) Latanya Maudlin, MD as Consulting Physician (Orthopedic Surgery) Kidney, Ellyn Hack, Lynelle Smoke, PharmD (Pharmacist)  Recent office visits: 07/27/2020 - PCP (Dr Etter Sjogren) No med changes.   Recent consult visits: 12/12/2020 - Ortho (Dr Gladstone Lighter) left shoulder pain; swelling of left upper limb. No structural issues; referred to vascular surgeon 11/24/2020 - Cardio (Dr Caryl Comes) f/u atrial fibrillation.  no med changes.  Hospital visits: None in previous 6 months  Objective:  Lab Results  Component Value Date   CREATININE 1.59 (H) 11/20/2020   CREATININE 1.39 (H) 10/20/2020   CREATININE 1.32 (H) 09/19/2020    Lab Results  Component Value Date   HGBA1C 6.3 (H) 11/23/2020   Last diabetic Eye exam:  Lab Results  Component Value Date/Time   HMDIABEYEEXA No Retinopathy 05/21/2018 12:00 AM    Last diabetic Foot exam: No results found for: HMDIABFOOTEX      Component Value Date/Time   CHOL 162 11/23/2020 0828   TRIG 206 (H) 11/23/2020 0828   HDL 38 (L) 11/23/2020 0828   CHOLHDL 4.3 11/23/2020 0828   CHOLHDL 5 07/27/2020 0858   VLDL 44.0 (H) 11/24/2018 0815   LDLCALC 89 11/23/2020 0828   LDLCALC  04/18/2020 0956     Comment:     . LDL cholesterol not calculated. Triglyceride levels greater than 400 mg/dL invalidate calculated LDL results. . Reference range: <100 . Desirable range <100 mg/dL for primary prevention;   <70 mg/dL for patients with CHD or diabetic patients  with > or = 2 CHD risk factors. [name] LDL-C is now calculated using the Martin-Hopkins  calculation, which is a validated novel method providing  better accuracy than the Friedewald equation in the  estimation of LDL-C.  [name] et al. [name]. 5465;681(27): 2061-2068  (http://education.[facility].com/faq/FAQ164)    LDLDIRECT 47.0 07/27/2020 0858    Hepatic Function Latest Ref Rng & Units 11/23/2020 11/20/2020 10/20/2020  Total Protein 6.0 - 8.5 g/dL 6.5 6.8 7.1  Albumin 3.6 - 4.6 g/dL 4.4 4.6 4.6  AST 0 - 40 IU/L 17 16 15   ALT 0 - 44 IU/L 21  18 15  Alk Phosphatase 44 - 121 IU/L 63 50 62  Total Bilirubin 0.0 - 1.2 mg/dL 0.3 0.5 0.4  Bilirubin, Direct 0.00 - 0.40 mg/dL 0.12 - -    Lab Results  Component Value Date/Time   TSH 2.450 07/27/2018 03:24 PM   TSH 2.101 08/18/2017 06:08 AM   TSH 2.53 07/28/2015 10:04 AM   FREET4 0.87 06/21/2014 04:45 PM    CBC Latest Ref Rng & Units 11/20/2020  10/20/2020 09/19/2020  WBC 4.0 - 10.5 K/uL 7.5 7.0 10.3  Hemoglobin 13.0 - 17.0 g/dL 12.5(L) 12.0(L) 12.8(L)  Hematocrit 39.0 - 52.0 % 38.7(L) 37.0(L) 38.9(L)  Platelets 150 - 400 K/uL 203 223 247    Lab Results  Component Value Date/Time   VD25OH 16.1 (L) 07/27/2018 03:24 PM    Clinical ASCVD: Yes  The ASCVD Risk score Mikey Bussing DC Jr., et al., 2013) failed to calculate for the following reasons:   The 2013 ASCVD risk score is only valid for ages 38 to 35   The patient has a prior MI or stroke diagnosis     Social History   Tobacco Use  Smoking Status Never  Smokeless Tobacco Never   BP Readings from Last 3 Encounters:  12/01/20 (!) 115/56  11/24/20 130/66  11/21/20 124/70   Pulse Readings from Last 3 Encounters:  12/01/20 63  11/24/20 68  11/21/20 75   Wt Readings from Last 3 Encounters:  11/24/20 249 lb (112.9 kg)  11/21/20 244 lb 12.8 oz (111 kg)  11/20/20 244 lb (110.7 kg)    Assessment: Review of patient past medical history, allergies, medications, health status, including review of consultants reports, laboratory and other test data, was performed as part of comprehensive evaluation and provision of chronic care management services.   SDOH:  (Social Determinants of Health) assessments and interventions performed:    CCM Care Plan  Allergies  Allergen Reactions   Benazepril Swelling    angioedema; he is not a candidate for any angiotensin receptor blockers because of this significant allergic reaction. Because of a history of documented adverse serious drug reaction;Medi Alert bracelet  is recommended   Hctz [Hydrochlorothiazide] Anaphylaxis and Swelling    Tongue and lip swelling    Aspirin Other (See Comments)    Gastritis, cant take 325 Mg aspirin    Lactose Intolerance (Gi) Nausea And Vomiting    Medications Reviewed Today     Reviewed by Gardiner Barefoot, DPM (Physician) on 12/19/20 at 1422  Med List Status: <None>   Medication Order Taking? Sig  Documenting Provider Last Dose Status Informant  Acetaminophen (TYLENOL) 325 MG CAPS [id number] No Tylenol [provider] Taking Active   allopurinol (ZYLOPRIM) 300 MG tablet [id number] No Take 450 mg by mouth daily. [provider] Taking Active Self  aluminum hydroxide-magnesium carbonate (GAVISCON) 95-358 MG/15ML SUSP [id number] No Take 15 mLs by mouth as needed for indigestion or heartburn. [provider] Taking Active Self  amLODipine (NORVASC) 5 MG tablet [id number] No TAKE 1 TABLET BY MOUTH TWICE A Kelton Pillar, MD Taking Active   apixaban (ELIQUIS) 2.5 MG TABS tablet [id number] No Take 1 tablet (2.5 mg total) by mouth 2 (two) times daily. Shirley Friar, PA-C Taking Active   atorvastatin (LIPITOR) 80 MG tablet [id number] No TAKE ONE TABLET BY MOUTH AT BEDTIME Minus Breeding, MD Taking Active   azelastine (ASTELIN) 0.1 % nasal spray [id number] No Place 2 sprays into both nostrils at bedtime as needed for rhinitis or allergies. Lowne  Koren Shiver, DO Taking Active   esomeprazole (NEXIUM) 40 MG capsule [id number] No Take 1 capsule (40 mg total) by mouth daily. [name], MD Taking Active Self  ezetimibe (ZETIA) 10 MG tablet [id number] No TAKE 1 TABLET BY MOUTH EVERY DAY Minus Breeding, MD Taking Active   FARXIGA 10 MG TABS tablet [id number] No Take 10 mg by mouth daily.  Patient not taking: Reported on 12/15/2020   [provider] Not Taking Active            Med Note Antony Contras, [place] B   Fri Dec 15, 2020  1:20 PM) Not taking due to cost  fenofibrate 160 MG tablet [id number] No Take 1 tablet (160 mg total) by mouth daily. Roma Schanz R, DO Taking Active   folic acid (FOLVITE) 1 MG tablet [id number] No TAKE 2 TABLETS BY MOUTH EVERY DAY [name], MD Taking Active   FREESTYLE LITE test strip [id number] No USE TO TEST BLOOD SUGAR ONCE A DAY. DX CODE: E11.9 [name], DO Taking Active   glimepiride (AMARYL) 1 MG tablet  [id number] No TAKE 1 TABLET BY MOUTH DAILY WITH BREAKFAST. [name], DO Taking Active   Lancets (FREESTYLE) lancets [id number] No USE TWICE A DAY TO CHECK BLOOD SUGAR. DX E11.9 Roma Schanz R, DO Taking Active   metFORMIN (GLUCOPHAGE) 1000 MG tablet [id number] No TAKE 1 & 1/2 TABLETS BY MOUTH EVERY MORNING AND 1 TABLET IN THE EVENING. Roma Schanz R, DO Taking Active   metoprolol tartrate (LOPRESSOR) 25 MG tablet [id number] No TAKE ONE-HALF TABLET BY MOUTH TWICE DAILY Minus Breeding, MD Taking Active   nitroGLYCERIN (NITROSTAT) 0.4 MG SL tablet [id number] No PLACE 1 TABLET (0.4 MG TOTAL) UNDER THE TONGUE EVERY 5 (FIVE) MINUTES AS NEEDED FOR CHEST PAIN. Minus Breeding, MD Taking Active   polycarbophil (FIBERCON) 625 MG tablet [id number] No Take 625 mg by mouth daily. [provider] Taking Active Self  potassium chloride SA (KLOR-CON M20) 20 MEQ tablet [id number] No Take 2 tablets (40 mEq total) by mouth 2 (two) times daily. Minus Breeding, MD Taking Active   pregabalin (LYRICA) 150 MG capsule [id number] No TAKE 1 CAPSULE BY MOUTH TWICE A DAY Lowne Chase, Yvonne R, DO Taking Active   psyllium (METAMUCIL) 58.6 % powder [id number] No Take 1 packet by mouth daily as needed (constipation).   Patient not taking: Reported on 12/15/2020   [provider] Not Taking Active Self  sodium chloride flush (NS) 0.9 % injection 10 mL [id number]   [name], MD  Active   torsemide (DEMADEX) 20 MG tablet [id number] No Take 20 mg by mouth daily. [provider] Taking Active   VASCEPA 1 g capsule [id number] No TAKE 2 CAPSULES BY MOUTH TWICE A DAY [name], DO Taking Active   Med List Note Henderson Newcomer, CPhT 06/10/13 1537): cpap machine 2 L of oxygen at night            Patient Active Problem List   Diagnosis Date Noted   Cardiac pacemaker in situ 11/20/2020   S/P CABG x 4 11/20/2020   Ascending aortic aneurysm ([place]) 11/20/2020   Senile  purpura ([place]) 07/27/2020   Porokeratosis 06/20/2020   Pain due to onychomycosis of toenails of both feet 12/14/2019   Educated about COVID-19 virus infection 11/25/2019   Uncontrolled type 2 diabetes mellitus with hypoglycemia without coma ([place]) 08/10/2019   Hyperlipidemia associated with type 2 diabetes mellitus (  [place]) 08/10/2019   S/P left TKA 06/29/2019   Peripheral neuropathy 07/27/2018   Uncontrolled type 2 diabetes mellitus with hyperglycemia ([place]) 05/13/2018   Diabetic peripheral neuropathy associated with type 2 diabetes mellitus ([place]) 05/13/2018   Pansinusitis 05/13/2018   Severe aortic stenosis    Complete heart block (HCC)    Paroxysmal atrial fibrillation (HCC)    Acute on chronic diastolic CHF (congestive heart failure), NYHA class 2 (HCC)    Left arm swelling    Status post transcatheter aortic valve replacement (TAVR) using bioprosthesis 09/09/2017   Demand ischemia of myocardium (HCC)    Typical atrial flutter ([place])    Acute on chronic diastolic heart failure ([place])    GIB (gastrointestinal bleeding) 08/16/2017   Hyperlipidemia 08/05/2017   NSTEMI (non-ST elevated myocardial infarction) ([place])    Aortic stenosis, severe 07/25/2017   Pancytopenia ([place]) 07/24/2017   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 ([place]) 02/27/2017   Bradycardia 01/04/2017   Coronary artery disease 01/04/2017   Stenosis of carotid artery 01/04/2017   Obesity 09/18/2016   Wenckebach block    DM (diabetes mellitus) type II uncontrolled, periph vascular disorder ([place]) 05/21/2013   Spinal stenosis of lumbar region 04/08/2013   Anemia, iron deficiency 11/29/2011   B12 deficiency anemia 07/30/2011   OSA (obstructive sleep apnea) 07/06/2010   CAD, ARTERY BYPASS GRAFT 08/22/2009   Essential hypertension 03/29/2009   Bilateral lower extremity edema 02/21/2009   Hyperlipidemia LDL goal <70 11/26/2008    Immunization History  Administered Date(s) Administered   Influenza Split  06/12/2011, 05/31/2013   Influenza Whole 05/01/2012   Influenza, High Dose Seasonal PF 02/09/2015, 03/12/2017, 03/09/2018, 03/02/2020   Influenza-Unspecified 02/11/2014, 02/29/2016, 02/25/2019   Moderna Sars-Covid-2 Vaccination 08/30/2019, 09/30/2019   Pneumococcal Conjugate-13 09/18/2015   Pneumococcal Polysaccharide-23 06/20/2013   Td 03/20/2010, 03/16/2013, 09/29/2017   Zoster Recombinat (Shingrix) 11/21/2016, 09/25/2017    Conditions to be addressed/monitored: Atrial Fibrillation, CHF, CAD, HTN, HLD, Hypertriglyceridemia, DMII, CKD Stage 3, and gout; neuropathy; h/o GIB; OSA with CPAP;   Care Plan : General Pharmacy (Adult)  Updates made by [name], PHARMD since 12/19/2020 12:00 AM     Problem: HTN, HLD, DM, HF   Priority: High  Onset Date: 09/13/2020     Long-Range Goal: Patient-Specific Goal   Start Date: 09/13/2020  Expected End Date: 03/16/2021  Recent Progress: On track  Priority: High  Note:   Current Barriers:  Unable to independently afford cost of medication (Farxiga)  Unable to maintain control of mixed hyperlipidemia (elevated LDL and triglycerides and low HDL)  Pharmacist Clinical Goal(s):  Over the next 90 days, patient will achieve adherence to monitoring guidelines and medication adherence to achieve therapeutic efficacy maintain control of blood pressure and BG as evidenced by home monitoring.  contact provider office for questions/concerns as evidenced notation of same in electronic health record through collaboration with PharmD and provider.  Work to bring down TG's by dietary modifications, continued exercised, control of BG and taking medications as prescribed  Interventions: 1:1 collaboration with Carollee Herter, Alferd Apa, DO regarding development and update of comprehensive plan of care as evidenced by provider attestation and co-signature Inter-disciplinary care team collaboration (see longitudinal plan of care) Comprehensive medication review  performed; medication list updated in electronic medical record  Hypertension (BP goal <130/80) Controlled Current treatment: Amlodipine 5mg  daily  Metoprolol Tartrate 25mg  - take 0.5 tablet (= 12.5mg ) twice daily  Medications previously tried: benazepril (swelling), HCTZ (anaphylaxis)  Current home BP range SBP =  120 to 130 DBP = 70-78 Diet:  has been reducing intake of sweets and fried foods Also is chooseing leaner proteins and lower fat dairy. Has replaced sodas with sparkling water. (I really feel this has made a big difference in his Triglycerides.) Exercise: still golfing 3 times a week with his group of about 8 friends; Some leg exercises and walking.  Denies hypotensive/hypertensive symptoms Interventions:  Educated on BP goals and benefits of medications for prevention of heart attack, stroke and kidney damage; Recommended limit daily salt intake to < 2300 mg; Exercise goal of 150 minutes per week; Continue to check BP at home 2 to 3 times per week; document, and provide log at future appointments Recommended to continue current medication  Hyperlipidemia / CAD: (LDL goal < 70; Tg goal <150; HDL goal >40) Not at goal but triglycerides have improved from 839 to 206! Current treatment: Atorvastatin 4m daily Ezetimibe 170mdaily Fenofibrate 16033maily Vascepa 1g - take 2 capsules twice daily  Nitroglycerin 0.4mg59m needed Medications previously tried: none noted Interventions:  Continue dietary changes mentioned above under HTN Reviewed cholesterol goals;  Continue to exercise regularly with goal of at least 150 minutes per week  Diabetes (A1c goal <7%) Controlled Current medications: Glimepiride 1mg 70mly  Metformin 1000mg 22mtabs each morning, and 1 tablet each evening Medications previously tried: Farxig[place](5m for one month for BG, CKD and HFpEF but stopped due to cost)  Current home glucose readings fasting glucose: 98; 105; 126; 137 Denies  hypoglycemic/hyperglycemic symptoms Current meal patterns: see above Current exercise: see above Interventions:  Educated on A1c and blood glucose goals Screened for patient assistance for Farxiga thru [place] and [place] program (does not meet income criteria)  Continue to exercise at least 150 minutes per week; Reminded to check feet daily and get yearly eye exam Recommended to continue current medication  Heart Failure (Goal: manage symptoms and prevent exacerbations) Controlled  Denies edema / swelling or SOB Followed by cardiology Dr. HochreiPercival Spanishjection fraction: 60-65% (12/23/2019) HF type: Diastolic / HFpEF Current treatment: Torsemide 20mg - 36m 2 tablets = 40mg dai80motassium 20mEq - t60m2 tablets = 40 mEq twice daily Metoprolol tartrate 25mg - tak57m5 tablet = 12.5mg twice a21my Medications previously tried: furosemide (ineffective) ; Farxiga (cosWilder Gladeerventions:  Educated on Benefits of medications for managing symptoms and prolonging life [place] patient [place] (does not meet income criteria)  Recommended to continue current medication  CKD - stage 3 (goal: slow pregression of  renal disease and adjust medications as needed based on renal function) Managed by nephrology Started Farxiga 10mg daily b74matient has to stop due to cost - does not meet income criteria for PAP Last eGFR was 43 (11/20/2020) Estimated CrCL = 39 to 49 mL / min (range based on using adjusted and ideal body weight)  Interventions:  Reviewed medications that might need adjustment based on renal function:  Eliquis - adjusted already based on renal function and age to 2.5mg twice a d27mPregabalin - CrCL 30 to 60 mL/min = max of 300mg/ day (pat53m is taking 150mg / day) Fen58mrate - recommended starting dose is 54mg daily and a45mt upward as needed monitoring for myalgia. Patient is on highest dose but Ok as long as monitoring for myalgias due to increased  risk with lower renal function and concurrent treatment with statin.  Metformin - if eGFR falls below 45 - monitor and  weight risk versus benefits of full dose. Recommend continue to monitor; Consider lower dose if eGFR continues to drop.   Patient Goals/Self-Care Activities Over the next 90 days, patient will:  take medications as prescribed check glucose daily, document, and provide at future appointments check blood pressure 2 to 3 times daily, document, and provide at future appointments target a minimum of 150 minutes of moderate intensity exercise weekly Engage in dietary modifications by continuing to cut back on sugar containing beverages and foods  Follow Up Plan: The care management team will reach out to the patient again over the next 90 days.       Medication Assistance:  assessed for Farxiga PAP -patient does not meet income requirement  Patient's preferred pharmacy is:  [facility], [place] [address] Phone: [phone] Fax: [phone]   Follow Up:  Patient agrees to Care Plan and Follow-up.  Plan: Telephone follow up appointment with care management team member scheduled for:  3 to 4 months  [name], PharmD Clinical Pharmacist [facility] [phone]

## 2020-12-20 ENCOUNTER — Inpatient Hospital Stay (HOSPITAL_BASED_OUTPATIENT_CLINIC_OR_DEPARTMENT_OTHER): Payer: Medicare Other | Admitting: Family

## 2020-12-20 ENCOUNTER — Inpatient Hospital Stay: Payer: Medicare Other

## 2020-12-20 ENCOUNTER — Telehealth: Payer: Self-pay | Admitting: *Deleted

## 2020-12-20 ENCOUNTER — Encounter: Payer: Self-pay | Admitting: Family

## 2020-12-20 VITALS — BP 128/67 | HR 92 | Temp 98.4°F | Resp 20 | Ht 72.0 in | Wt 245.0 lb

## 2020-12-20 VITALS — BP 128/67 | HR 92 | Temp 98.4°F | Resp 16

## 2020-12-20 DIAGNOSIS — C8332 Diffuse large B-cell lymphoma, intrathoracic lymph nodes: Secondary | ICD-10-CM | POA: Diagnosis not present

## 2020-12-20 DIAGNOSIS — D519 Vitamin B12 deficiency anemia, unspecified: Secondary | ICD-10-CM

## 2020-12-20 DIAGNOSIS — D508 Other iron deficiency anemias: Secondary | ICD-10-CM

## 2020-12-20 DIAGNOSIS — C8338 Diffuse large B-cell lymphoma, lymph nodes of multiple sites: Secondary | ICD-10-CM | POA: Diagnosis not present

## 2020-12-20 DIAGNOSIS — D500 Iron deficiency anemia secondary to blood loss (chronic): Secondary | ICD-10-CM

## 2020-12-20 DIAGNOSIS — D510 Vitamin B12 deficiency anemia due to intrinsic factor deficiency: Secondary | ICD-10-CM | POA: Diagnosis not present

## 2020-12-20 DIAGNOSIS — G620 Drug-induced polyneuropathy: Secondary | ICD-10-CM | POA: Diagnosis not present

## 2020-12-20 DIAGNOSIS — M7989 Other specified soft tissue disorders: Secondary | ICD-10-CM | POA: Diagnosis not present

## 2020-12-20 LAB — CBC WITH DIFFERENTIAL (CANCER CENTER ONLY)
Abs Immature Granulocytes: 0.1 10*3/uL — ABNORMAL HIGH (ref 0.00–0.07)
Basophils Absolute: 0.1 10*3/uL (ref 0.0–0.1)
Basophils Relative: 1 %
Eosinophils Absolute: 0.1 10*3/uL (ref 0.0–0.5)
Eosinophils Relative: 2 %
HCT: 41.9 % (ref 39.0–52.0)
Hemoglobin: 13.4 g/dL (ref 13.0–17.0)
Immature Granulocytes: 1 %
Lymphocytes Relative: 16 %
Lymphs Abs: 1.4 10*3/uL (ref 0.7–4.0)
MCH: 29.2 pg (ref 26.0–34.0)
MCHC: 32 g/dL (ref 30.0–36.0)
MCV: 91.3 fL (ref 80.0–100.0)
Monocytes Absolute: 0.6 10*3/uL (ref 0.1–1.0)
Monocytes Relative: 7 %
Neutro Abs: 6.7 10*3/uL (ref 1.7–7.7)
Neutrophils Relative %: 73 %
Platelet Count: 212 10*3/uL (ref 150–400)
RBC: 4.59 MIL/uL (ref 4.22–5.81)
RDW: 16.5 % — ABNORMAL HIGH (ref 11.5–15.5)
WBC Count: 9 10*3/uL (ref 4.0–10.5)
nRBC: 0 % (ref 0.0–0.2)

## 2020-12-20 LAB — CMP (CANCER CENTER ONLY)
ALT: 18 U/L (ref 0–44)
AST: 16 U/L (ref 15–41)
Albumin: 4.9 g/dL (ref 3.5–5.0)
Alkaline Phosphatase: 57 U/L (ref 38–126)
Anion gap: 9 (ref 5–15)
BUN: 36 mg/dL — ABNORMAL HIGH (ref 8–23)
CO2: 27 mmol/L (ref 22–32)
Calcium: 11.1 mg/dL — ABNORMAL HIGH (ref 8.9–10.3)
Chloride: 102 mmol/L (ref 98–111)
Creatinine: 1.51 mg/dL — ABNORMAL HIGH (ref 0.61–1.24)
GFR, Estimated: 46 mL/min — ABNORMAL LOW (ref >60)
Glucose, Bld: 117 mg/dL — ABNORMAL HIGH (ref 70–99)
Potassium: 4.9 mmol/L (ref 3.5–5.1)
Sodium: 138 mmol/L (ref 135–145)
Total Bilirubin: 0.5 mg/dL (ref 0.3–1.2)
Total Protein: 7.2 g/dL (ref 6.5–8.1)

## 2020-12-20 LAB — RETICULOCYTES
Immature Retic Fract: 27.1 % — ABNORMAL HIGH (ref 2.3–15.9)
RBC.: 4.55 MIL/uL (ref 4.22–5.81)
Retic Count, Absolute: 79.6 10*3/uL (ref 19.0–186.0)
Retic Ct Pct: 1.8 % (ref 0.4–3.1)

## 2020-12-20 LAB — VITAMIN B12: Vitamin B-12: 134 pg/mL — ABNORMAL LOW (ref 180–914)

## 2020-12-20 MED ORDER — ALTEPLASE 2 MG IJ SOLR
INTRAMUSCULAR | Status: AC
  Filled 2020-12-20: qty 2

## 2020-12-20 MED ORDER — ALTEPLASE 2 MG IJ SOLR
2.0000 mg | Freq: Once | INTRAMUSCULAR | Status: AC | PRN
  Administered 2020-12-20: 2 mg
  Filled 2020-12-20: qty 2

## 2020-12-20 MED ORDER — HEPARIN SOD (PORK) LOCK FLUSH 100 UNIT/ML IV SOLN
500.0000 [IU] | Freq: Once | INTRAVENOUS | Status: AC | PRN
  Administered 2020-12-20: 500 [IU]
  Filled 2020-12-20: qty 5

## 2020-12-20 MED ORDER — SODIUM CHLORIDE 0.9% FLUSH
10.0000 mL | INTRAVENOUS | Status: DC | PRN
  Administered 2020-12-20: 10 mL
  Filled 2020-12-20: qty 10

## 2020-12-20 MED ORDER — CYANOCOBALAMIN 1000 MCG/ML IJ SOLN
1000.0000 ug | Freq: Once | INTRAMUSCULAR | Status: AC
  Administered 2020-12-20: 1000 ug via INTRAMUSCULAR

## 2020-12-20 MED ORDER — DENOSUMAB 120 MG/1.7ML ~~LOC~~ SOLN
120.0000 mg | Freq: Once | SUBCUTANEOUS | Status: AC
Start: 2020-12-20 — End: 2020-12-20
  Administered 2020-12-20: 120 mg via SUBCUTANEOUS

## 2020-12-20 NOTE — Patient Instructions (Addendum)
Denosumab injection What is this medication? DENOSUMAB (den oh sue mab) slows bone breakdown. Prolia is used to treat osteoporosis in women after menopause and in men, and in people who are taking corticosteroids for 6 months or more. Delton See is used to treat a high calcium level due to cancer and to prevent bone fractures and other bone problems caused by multiple myeloma or cancer bone metastases. Delton See is also used totreat giant cell tumor of the bone. This medicine may be used for other purposes; ask your health care provider orpharmacist if you have questions. COMMON BRAND NAME(S): Prolia, XGEVA What should I tell my care team before I take this medication? They need to know if you have any of these conditions: dental disease having surgery or tooth extraction infection kidney disease low levels of calcium or Vitamin D in the blood malnutrition on hemodialysis skin conditions or sensitivity thyroid or parathyroid disease an unusual reaction to denosumab, other medicines, foods, dyes, or preservatives pregnant or trying to get pregnant breast-feeding How should I use this medication? This medicine is for injection under the skin. It is given by a health careprofessional in a hospital or clinic setting. A special MedGuide will be given to you before each treatment. Be sure to readthis information carefully each time. For Prolia, talk to your pediatrician regarding the use of this medicine in children. Special care may be needed. For Delton See, talk to your pediatrician regarding the use of this medicine in children. While this drug may be prescribed for children as young as 13 years for selected conditions,precautions do apply. Overdosage: If you think you have taken too much of this medicine contact apoison control center or emergency room at once. NOTE: This medicine is only for you. Do not share this medicine with others. What if I miss a dose? It is important not to miss your dose. Call  your doctor or health careprofessional if you are unable to keep an appointment. What may interact with this medication? Do not take this medicine with any of the following medications: other medicines containing denosumab This medicine may also interact with the following medications: medicines that lower your chance of fighting infection steroid medicines like prednisone or cortisone This list may not describe all possible interactions. Give your health care provider a list of all the medicines, herbs, non-prescription drugs, or dietary supplements you use. Also tell them if you smoke, drink alcohol, or use illegaldrugs. Some items may interact with your medicine. What should I watch for while using this medication? Visit your doctor or health care professional for regular checks on your progress. Your doctor or health care professional may order blood tests andother tests to see how you are doing. Call your doctor or health care professional for advice if you get a fever, chills or sore throat, or other symptoms of a cold or flu. Do not treat yourself. This drug may decrease your body's ability to fight infection. Try toavoid being around people who are sick. You should make sure you get enough calcium and vitamin D while you are taking this medicine, unless your doctor tells you not to. Discuss the foods you eatand the vitamins you take with your health care professional. See your dentist regularly. Brush and floss your teeth as directed. Before youhave any dental work done, tell your dentist you are receiving this medicine. Do not become pregnant while taking this medicine or for 5 months after stopping it. Talk with your doctor or health care professional about your  birth control options while taking this medicine. Women should inform their doctor if they wish to become pregnant or think they might be pregnant. There is a potential for serious side effects to an unborn child. Talk to your health  careprofessional or pharmacist for more information. What side effects may I notice from receiving this medication? Side effects that you should report to your doctor or health care professionalas soon as possible: allergic reactions like skin rash, itching or hives, swelling of the face, lips, or tongue bone pain breathing problems dizziness jaw pain, especially after dental work redness, blistering, peeling of the skin signs and symptoms of infection like fever or chills; cough; sore throat; pain or trouble passing urine signs of low calcium like fast heartbeat, muscle cramps or muscle pain; pain, tingling, numbness in the hands or feet; seizures unusual bleeding or bruising unusually weak or tired Side effects that usually do not require medical attention (report to yourdoctor or health care professional if they continue or are bothersome): constipation diarrhea headache joint pain loss of appetite muscle pain runny nose tiredness upset stomach This list may not describe all possible side effects. Call your doctor for medical advice about side effects. You may report side effects to FDA at1-800-FDA-1088. Where should I keep my medication? This medicine is only given in a clinic, doctor's office, or other health caresetting and will not be stored at home. NOTE: This sheet is a summary. It may not cover all possible information. If you have questions about this medicine, talk to your doctor, pharmacist, orhealth care provider.  2022 Elsevier/Gold Standard (2017-10-24 16:10:44)  Vitamin B12 Injection What is this medication? Vitamin B12 (VAHY tuh min B12) prevents and treats low vitamin B12 levels in your body. It is used in people who do not get enough vitamin B12 from their diet or when their digestive tract does not absorb enough. Vitamin B12 plays an important role in maintaining the health of your nervous system and red bloodcells. This medicine may be used for other purposes;  ask your health care provider orpharmacist if you have questions. COMMON BRAND NAME(S): B-12 Compliance Kit, B-12 Injection Kit, Cyomin, LA-12,Nutri-Twelve, Physicians EZ Use B-12, Primabalt What should I tell my care team before I take this medication? They need to know if you have any of these conditions: Kidney disease Leber's disease Megaloblastic anemia An unusual or allergic reaction to cyanocobalamin, cobalt, other medications, foods, dyes, or preservatives Pregnant or trying to get pregnant Breast-feeding How should I use this medication? This medication is injected into a muscle or deeply under the skin. It is usually given in a clinic or care team's office. However, your care team mayteach you how to inject yourself. Follow all instructions. Talk to your care team about the use of this medication in children. Specialcare may be needed. Overdosage: If you think you have taken too much of this medicine contact apoison control center or emergency room at once. NOTE: This medicine is only for you. Do not share this medicine with others. What if I miss a dose? If you are given your dose at a clinic or care team's office, call to reschedule your appointment. If you give your own injections, and you miss a dose, take it as soon as you can. If it is almost time for your next dose, takeonly that dose. Do not take double or extra doses. What may interact with this medication? Colchicine Heavy alcohol intake This list may not describe all possible interactions. Give your   care provider a list of all the medicines, herbs, non-prescription drugs, or dietary supplements you use. Also tell them if you smoke, drink alcohol, or use illegaldrugs. Some items may interact with your medicine. What should I watch for while using this medication? Visit your care team regularly. You may need blood work done while you aretaking this medication. You may need to follow a special diet. Talk to your care team.  Limit youralcohol intake and avoid smoking to get the best benefit. What side effects may I notice from receiving this medication? Side effects that you should report to your care team as soon as possible: Allergic reactions-skin rash, itching, hives, swelling of the face, lips, tongue, or throat Swelling of the ankles, hands, or feet Trouble breathing Side effects that usually do not require medical attention (report to your careteam if they continue or are bothersome): Diarrhea This list may not describe all possible side effects. Call your doctor for medical advice about side effects. You may report side effects to FDA at1-800-FDA-1088. Where should I keep my medication? Keep out of the reach of children. Store at room temperature between 15 and 30 degrees C (59 and 85 degrees F).Protect from light. Throw away any unused medication after the expiration date. NOTE: This sheet is a summary. It may not cover all possible information. If you have questions about this medicine, talk to your doctor, pharmacist, orhealth care provider.  2022 Elsevier/Gold Standard (2020-08-07 11:47:06)

## 2020-12-20 NOTE — Telephone Encounter (Signed)
Per 12/20/20 los - gave patient upcoming appointments - patient confirmed - print calendar

## 2020-12-20 NOTE — Progress Notes (Signed)
Hematology and Oncology Follow Up Visit  Richard [name] [id number] 26-Dec-1940 80 y.o. 12/20/2020   Principle Diagnosis:  Diffuse large cell non-Hodgkin's lymphoma (IPI = 3) - NOT "double hit" Pernicious anemia Iron deficiency secondary to bleeding   Past Therapy: R-CHOP - s/p cycle 8 - completed 08/2017   Current Therapy:        Vitamin B12 1 mg IM every month Xgeva 120 mg subcu q 3 months - next dose due in 03/2021 IV iron as indicated    Interim History:  Richard [name] is here today for follow-up, B 12 and Xgeva injections. He is doing well but still has the swelling in his left arm. This is felt to possibly be due to lymphedema secondary to previous lymph node removal. He has an appointment tomorrow with Vascular for further work up.  Radial and pedal pulses are 2+.  No fever, chills, n/v, cough, rash, dizziness, SOB, chest pain, palpitations, abdominal pain or changes in bowel or bladder habits.  No tenderness in her extremities.  The neuropathy in his lower extremities is unchanged.  No falls or syncope to report.  He is still golfing 3 days a week.  He has maintained a good appetite and is staying well hydrated. His weight is stable at 245 lbs.   ECOG Performance Status: 1 - Symptomatic but completely ambulatory  Medications:  Allergies as of 12/20/2020       Reactions   Benazepril Swelling   angioedema; he is not a candidate for any angiotensin receptor blockers because of this significant allergic reaction. Because of a history of documented adverse serious drug reaction;Medi Alert bracelet  is recommended   Hctz [hydrochlorothiazide] Anaphylaxis, Swelling   Tongue and lip swelling   Aspirin Other (See Comments)   Gastritis, cant take 325 Mg aspirin    Lactose Intolerance (gi) Nausea And Vomiting        Medication List        Accurate as of December 20, 2020  1:43 PM. If you have any questions, ask your nurse or doctor.          STOP taking these medications     Farxiga 10 MG Tabs tablet Generic drug: dapagliflozin propanediol Stopped by: Laverna Peace, NP       TAKE these medications    allopurinol 300 MG tablet Commonly known as: ZYLOPRIM Take 450 mg by mouth daily.   aluminum hydroxide-magnesium carbonate 95-358 MG/15ML Susp Commonly known as: GAVISCON Take 15 mLs by mouth as needed for indigestion or heartburn.   amLODipine 5 MG tablet Commonly known as: NORVASC TAKE 1 TABLET BY MOUTH TWICE A DAY   apixaban 2.5 MG Tabs tablet Commonly known as: Eliquis Take 1 tablet (2.5 mg total) by mouth 2 (two) times daily.   atorvastatin 80 MG tablet Commonly known as: LIPITOR TAKE ONE TABLET BY MOUTH AT BEDTIME   azelastine 0.1 % nasal spray Commonly known as: ASTELIN Place 2 sprays into both nostrils at bedtime as needed for rhinitis or allergies.   esomeprazole 40 MG capsule Commonly known as: NexIUM Take 1 capsule (40 mg total) by mouth daily.   ezetimibe 10 MG tablet Commonly known as: ZETIA TAKE 1 TABLET BY MOUTH EVERY DAY   fenofibrate 160 MG tablet Take 1 tablet (160 mg total) by mouth daily.   folic acid 1 MG tablet Commonly known as: FOLVITE TAKE 2 TABLETS BY MOUTH EVERY DAY   freestyle lancets USE TWICE A DAY TO CHECK BLOOD SUGAR. DX E11.9  FREESTYLE LITE test strip Generic drug: glucose blood USE TO TEST BLOOD SUGAR ONCE A DAY. DX CODE: E11.9   glimepiride 1 MG tablet Commonly known as: AMARYL TAKE 1 TABLET BY MOUTH DAILY WITH BREAKFAST.   metFORMIN 1000 MG tablet Commonly known as: GLUCOPHAGE TAKE 1 & 1/2 TABLETS BY MOUTH EVERY MORNING AND 1 TABLET IN THE EVENING.   metoprolol tartrate 25 MG tablet Commonly known as: LOPRESSOR TAKE ONE-HALF TABLET BY MOUTH TWICE DAILY   nitroGLYCERIN 0.4 MG SL tablet Commonly known as: NITROSTAT PLACE 1 TABLET (0.4 MG TOTAL) UNDER THE TONGUE EVERY 5 (FIVE) MINUTES AS NEEDED FOR CHEST PAIN.   polycarbophil 625 MG tablet Commonly known as: FIBERCON Take 625  mg by mouth daily.   potassium chloride SA 20 MEQ tablet Commonly known as: Klor-Con M20 Take 2 tablets (40 mEq total) by mouth 2 (two) times daily.   pregabalin 150 MG capsule Commonly known as: LYRICA TAKE 1 CAPSULE BY MOUTH TWICE A DAY   psyllium 58.6 % powder Commonly known as: METAMUCIL Take 1 packet by mouth daily as needed (constipation).   torsemide 20 MG tablet Commonly known as: DEMADEX Take 20 mg by mouth daily.   Tylenol 325 MG Caps Generic drug: Acetaminophen Tylenol   Vascepa 1 g capsule Generic drug: icosapent Ethyl TAKE 2 CAPSULES BY MOUTH TWICE A DAY        Allergies:  Allergies  Allergen Reactions   Benazepril Swelling    angioedema; he is not a candidate for any angiotensin receptor blockers because of this significant allergic reaction. Because of a history of documented adverse serious drug reaction;Medi Alert bracelet  is recommended   Hctz [Hydrochlorothiazide] Anaphylaxis and Swelling    Tongue and lip swelling    Aspirin Other (See Comments)    Gastritis, cant take 325 Mg aspirin    Lactose Intolerance (Gi) Nausea And Vomiting    Past Medical History, Surgical history, Social history, and Family History were reviewed and updated.  Review of Systems: All other 10 point review of systems is negative.   Physical Exam:  height is 6' (1.829 m) and weight is 245 lb (111.1 kg). His oral temperature is 98.4 F (36.9 C). His blood pressure is 128/67 and his pulse is 92. His respiration is 20 and oxygen saturation is 95%.   Wt Readings from Last 3 Encounters:  12/20/20 245 lb (111.1 kg)  11/24/20 249 lb (112.9 kg)  11/21/20 244 lb 12.8 oz (111 kg)    Ocular: Sclerae unicteric, pupils equal, round and reactive to light Ear-nose-throat: Oropharynx clear, dentition fair Lymphatic: No cervical or supraclavicular adenopathy Lungs no rales or rhonchi, good excursion bilaterally Heart regular rate and rhythm, no murmur appreciated Abd soft,  nontender, positive bowel sounds MSK no focal spinal tenderness, no joint edema Neuro: non-focal, well-oriented, appropriate affect Breasts: Deferred   Lab Results  Component Value Date   WBC 9.0 12/20/2020   HGB 13.4 12/20/2020   HCT 41.9 12/20/2020   MCV 91.3 12/20/2020   PLT 212 12/20/2020   Lab Results  Component Value Date   FERRITIN 1,296 (H) 11/20/2020   IRON 51 11/20/2020   TIBC 375 11/20/2020   UIBC 324 11/20/2020   IRONPCTSAT 13 (L) 11/20/2020   Lab Results  Component Value Date   RETICCTPCT 1.8 12/20/2020   RBC 4.59 12/20/2020   RBC 4.55 12/20/2020   RETICCTABS 52.1 11/22/2011   No results found for: KPAFRELGTCHN, LAMBDASER, KAPLAMBRATIO Lab Results  Component Value Date  IGA 159 03/09/2012   Lab Results  Component Value Date   ALBUMINELP 4.2 07/27/2018   MSPIKE Not Observed 07/27/2018     Chemistry      Component Value Date/Time   NA 139 11/20/2020 1330   NA 136 03/16/2019 0938   NA 144 06/27/2017 0857   K 3.9 11/20/2020 1330   K 3.9 06/27/2017 0857   CL 103 11/20/2020 1330   CL 103 06/27/2017 0857   CO2 25 11/20/2020 1330   CO2 26 06/27/2017 0857   BUN 35 (H) 11/20/2020 1330   BUN 34 (H) 03/16/2019 0938   BUN 12 06/27/2017 0857   CREATININE 1.59 (H) 11/20/2020 1330   CREATININE 1.65 (H) 04/18/2020 0956      Component Value Date/Time   CALCIUM 10.2 11/20/2020 1330   CALCIUM 9.2 06/27/2017 0857   ALKPHOS 63 11/23/2020 0828   ALKPHOS 128 (H) 06/27/2017 0857   AST 17 11/23/2020 0828   AST 16 11/20/2020 1330   ALT 21 11/23/2020 0828   ALT 18 11/20/2020 1330   ALT 24 06/27/2017 0857   BILITOT 0.3 11/23/2020 0828   BILITOT 0.5 11/20/2020 1330       Impression and Plan: Richard [name] is a very pleasant 80 yo caucasian gentleman with diffuse large B-cell lymphoma (not "double hit" lymphoma). He completed treatment in February 2019.  He received B 12 and Xgeva today as planned.  Iron studies are pending. We will replace if needed.  Follow-up  in 1 month.  He can contact our office with any questions or concerns.   Laverna Peace, NP 6/22/20221:43 PM

## 2020-12-20 NOTE — Patient Instructions (Signed)
Implanted Port Home Guide An implanted port is a device that is placed under the skin. It is usually placed in the chest. The device can be used to give IV medicine, to take blood, or for dialysis. You may have an implanted port if: You need IV medicine that would be irritating to the small veins in your hands or arms. You need IV medicines, such as antibiotics, for a long period of time. You need IV nutrition for a long period of time. You need dialysis. When you have a port, your health care provider can choose to use the port instead of veins in your arms for these procedures. You may have fewer limitations when using a port than you would if you used other types of long-term IVs, and you will likely be able to return to normal activities afteryour incision heals. An implanted port has two main parts: Reservoir. The reservoir is the part where a needle is inserted to give medicines or draw blood. The reservoir is round. After it is placed, it appears as a small, raised area under your skin. Catheter. The catheter is a thin, flexible tube that connects the reservoir to a vein. Medicine that is inserted into the reservoir goes into the catheter and then into the vein. How is my port accessed? To access your port: A numbing cream may be placed on the skin over the port site. Your health care provider will put on a mask and sterile gloves. The skin over your port will be cleaned carefully with a germ-killing soap and allowed to dry. Your health care provider will gently pinch the port and insert a needle into it. Your health care provider will check for a blood return to make sure the port is in the vein and is not clogged. If your port needs to remain accessed to get medicine continuously (constant infusion), your health care provider will place a clear bandage (dressing) over the needle site. The dressing and needle will need to be changed every week, or as told by your health care provider. What  is flushing? Flushing helps keep the port from getting clogged. Follow instructions from your health care provider about how and when to flush the port. Ports are usually flushed with saline solution or a medicine called heparin. The need for flushing will depend on how the port is used: If the port is only used from time to time to give medicines or draw blood, the port may need to be flushed: Before and after medicines have been given. Before and after blood has been drawn. As part of routine maintenance. Flushing may be recommended every 4-6 weeks. If a constant infusion is running, the port may not need to be flushed. Throw away any syringes in a disposal container that is meant for sharp items (sharps container). You can buy a sharps container from a pharmacy, or you can make one by using an empty hard plastic bottle with a cover. How long will my port stay implanted? The port can stay in for as long as your health care provider thinks it is needed. When it is time for the port to come out, a surgery will be done to remove it. The surgery will be similar to the procedure that was done to putthe port in. Follow these instructions at home:  Flush your port as told by your health care provider. If you need an infusion over several days, follow instructions from your health care provider about how to take   care of your port site. Make sure you: Wash your hands with soap and water before you change your dressing. If soap and water are not available, use alcohol-based hand sanitizer. Change your dressing as told by your health care provider. Place any used dressings or infusion bags into a plastic bag. Throw that bag in the trash. Keep the dressing that covers the needle clean and dry. Do not get it wet. Do not use scissors or sharp objects near the tube. Keep the tube clamped, unless it is being used. Check your port site every day for signs of infection. Check for: Redness, swelling, or  pain. Fluid or blood. Pus or a bad smell. Protect the skin around the port site. Avoid wearing bra straps that rub or irritate the site. Protect the skin around your port from seat belts. Place a soft pad over your chest if needed. Bathe or shower as told by your health care provider. The site may get wet as long as you are not actively receiving an infusion. Return to your normal activities as told by your health care provider. Ask your health care provider what activities are safe for you. Carry a medical alert card or wear a medical alert bracelet at all times. This will let health care providers know that you have an implanted port in case of an emergency. Get help right away if: You have redness, swelling, or pain at the port site. You have fluid or blood coming from your port site. You have pus or a bad smell coming from the port site. You have a fever. Summary Implanted ports are usually placed in the chest for long-term IV access. Follow instructions from your health care provider about flushing the port and changing bandages (dressings). Take care of the area around your port by avoiding clothing that puts pressure on the area, and by watching for signs of infection. Protect the skin around your port from seat belts. Place a soft pad over your chest if needed. Get help right away if you have a fever or you have redness, swelling, pain, drainage, or a bad smell at the port site. This information is not intended to replace advice given to you by your health care provider. Make sure you discuss any questions you have with your healthcare provider. Document Revised: 11/01/2019 Document Reviewed: 11/01/2019 Elsevier Patient Education  2022 [employer]  

## 2020-12-21 ENCOUNTER — Other Ambulatory Visit: Payer: Self-pay

## 2020-12-21 ENCOUNTER — Encounter: Payer: Self-pay | Admitting: Vascular Surgery

## 2020-12-21 ENCOUNTER — Ambulatory Visit (INDEPENDENT_AMBULATORY_CARE_PROVIDER_SITE_OTHER): Payer: Medicare Other | Admitting: Vascular Surgery

## 2020-12-21 VITALS — BP 147/69 | Temp 98.5°F | Resp 20 | Ht 72.0 in | Wt 245.0 lb

## 2020-12-21 DIAGNOSIS — I82B12 Acute embolism and thrombosis of left subclavian vein: Secondary | ICD-10-CM

## 2020-12-21 DIAGNOSIS — I25810 Atherosclerosis of coronary artery bypass graft(s) without angina pectoris: Secondary | ICD-10-CM

## 2020-12-21 LAB — IRON AND TIBC
Iron: 77 ug/dL (ref 42–163)
Saturation Ratios: 22 % (ref 20–55)
TIBC: 354 ug/dL (ref 202–409)
UIBC: 277 ug/dL (ref 117–376)

## 2020-12-21 LAB — FERRITIN: Ferritin: 1935 ng/mL — ABNORMAL HIGH (ref 24–336)

## 2020-12-21 NOTE — Progress Notes (Signed)
Referring Physician: Dr. Rushie Nyhan  Patient name: Richard [name] MRN: [id number] DOB: Nov 10, 1940 Sex: male  REASON FOR CONSULT: Left arm swelling  HPI: Richard [name] is a 80 y.o. male, with a 5 to 62-month history of increased left arm swelling.  His left arm is about 50% larger than his right arm.  He recently had an ulceration on his left hand and had some clear drainage from this.  He states the hand does have less swelling by the morning time if he elevates it while he is sleeping.  He has no prior history of DVT.  He does have a left-sided pacemaker with leads through the left subclavian vein.  He has also had axillary lymph nodes removed from the left side.  He has a right side Port-A-Cath.  The swelling is minimally bothersome to him.  He is still able to play golf and do his daily activities as he wishes.    Past Medical History:  Diagnosis Date   Anemia    Arthritis    hips   Axillary adenopathy 02/25/2017   Bradycardia    a. holter monitor has demonstrated HRs in 30s and [place]    CAD (coronary artery disease)    a. s/p CABG 2001  b.  07/28/2017 cath:   Severe three-vessel native CAD with total occlusion of LAD, ramus intermedius, first OM and RCA, patent RIMA to PDA, LIMA to LAD, sequential SVG to ramus intermedius and first OM.     Chronic lower back pain    Diffuse large B cell lymphoma (HCC)    Diverticulosis    Esophageal stricture    GERD (gastroesophageal reflux disease)    History of gout    HTN (hypertension)    Mixed hyperlipidemia    OSA on CPAP    with 2L O2 at night   Pancytopenia ([place])    a. related to chemo therapy for B cell lymphoma   Peptic stricture of esophagus    Presence of permanent cardiac pacemaker    sees Dr. Valaria Good pacemaker   Severe aortic stenosis    Spinal stenosis    Squamous cell carcinoma of skin 03/22/2009   Right mandible. SCCis, hypertrophic.    Type II diabetes mellitus ([place])    Wears dentures    partial upper    Past Surgical History:  Procedure Laterality Date   APPENDECTOMY  ~ [place] Left 11/06/2016   Procedure: CATARACT EXTRACTION PHACO AND INTRAOCULAR LENS PLACEMENT (IOC);  Surgeon: Estill Cotta, MD;  Location: [facility];  Service: Ophthalmology;  Laterality: Left;  Lot # [id number] H [place]: 01:09.4 AP%:25.2 CDE: 30.64   CATARACT EXTRACTION W/PHACO Right 12/04/2016   Procedure: CATARACT EXTRACTION PHACO AND INTRAOCULAR LENS PLACEMENT (IOC);  Surgeon: Estill Cotta, MD;  Location: [facility];  Service: Ophthalmology;  Laterality: Right;  [place] 1:25.9 AP% 24.1 CDE 39.10 Fluid Pack lot # [id number]   COLONOSCOPY W/ BIOPSIES AND POLYPECTOMY  2013   CORONARY ANGIOPLASTY  1993   CORONARY ANGIOPLASTY WITH STENT PLACEMENT  05/1997   "1"   CORONARY ARTERY BYPASS GRAFT  03/2000   "CABG X5"   ECTROPION REPAIR Right 09/01/2018   Procedure: REPAIR OF ECTROPION BILATERAL upper and lower;  Surgeon: Karle Starch, MD;  Location: [place];  Service: Ophthalmology;  Laterality: Right;  Diabetic - oral meds sleep apnea   ESOPHAGEAL DILATION  X 3-4   Dr. Lyla Son; "last one was  in the 1990's"   ESOPHAGOGASTRODUODENOSCOPY     multiple   FLEXIBLE SIGMOIDOSCOPY     multiple   HYDRADENITIS EXCISION Left 02/25/2017   Procedure: EXCISION DEEP LEFT AXILLARY LYMPH NODE;  Surgeon: Fanny Skates, MD;  Location: [place];  Service: General;  Laterality: Left;   INTRAOPERATIVE TRANSTHORACIC ECHOCARDIOGRAM N/A 09/09/2017   Procedure: INTRAOPERATIVE TRANSTHORACIC ECHOCARDIOGRAM;  Surgeon: Burnell Blanks, MD;  Location: [place];  Service: Open Heart Surgery;  Laterality: N/A;   KNEE ARTHROSCOPY Left 2011   meniscus repair   LEFT HEART CATHETERIZATION WITH CORONARY/GRAFT ANGIOGRAM N/A 03/16/2014   Procedure: LEFT HEART CATHETERIZATION WITH Beatrix Fetters;  Surgeon: Burnell Blanks, MD;  Location: [facility] CATH LAB;  Service: Cardiovascular;  Laterality:  N/A;   LUMBAR LAMINECTOMY/DECOMPRESSION MICRODISCECTOMY Right 06/17/2013   Procedure: LUMBAR LAMINECTOMY MICRODISCECTOMY L4-L5 RIGHT EXCISION OF SYNOVIAL CYST RIGHT   (1 LEVEL) RIGHT PARTIAL FACETECTOMY;  Surgeon: Tobi Bastos, MD;  Location: [facility];  Service: Orthopedics;  Laterality: Right;   MYELOGRAM  04/06/13   lumbar, Dr Gladstone Lighter   ORBITAL LESION EXCISION Right 09/01/2018   Procedure: ORBITOTOMY WITHOUT BONE FLAP WITH REMOVAL OF LESION RIGHT;  Surgeon: Karle Starch, MD;  Location: [place];  Service: Ophthalmology;  Laterality: Right;   PACEMAKER IMPLANT N/A 09/10/2017   Procedure: PACEMAKER IMPLANT;  Surgeon: Deboraha Sprang, MD;  Location: [place] [facility];  Service: Cardiovascular;  Laterality: N/A;   PANENDOSCOPY     PORTACATH PLACEMENT N/A 03/06/2017   Procedure: INSERTION PORT-A-CATH AND ASPIRATE SEROMA LEFT AXILLA;  Surgeon: Fanny Skates, MD;  Location: [place];  Service: General;  Laterality: N/A;   RIGHT/LEFT HEART CATH AND CORONARY/GRAFT ANGIOGRAPHY N/A 07/28/2017   Procedure: RIGHT/LEFT HEART CATH AND CORONARY/GRAFT ANGIOGRAPHY;  Surgeon: Sherren Mocha, MD;  Location: [place] [facility];  Service: Cardiovascular;  Laterality: N/A;   SHOULDER SURGERY Right 08/2010   screws placed; "tendons tore off"   SKIN CANCER EXCISION Right    "neck"   TONSILLECTOMY  ~ [place] Left 06/29/2019   Procedure: TOTAL KNEE ARTHROPLASTY;  Surgeon: Paralee Cancel, MD;  Location: [facility];  Service: Orthopedics;  Laterality: Left;  70 mins   TRANSCATHETER AORTIC VALVE REPLACEMENT, TRANSFEMORAL N/A 09/09/2017   Procedure: TRANSCATHETER AORTIC VALVE REPLACEMENT, TRANSFEMORAL;  Surgeon: Burnell Blanks, MD;  Location: [place];  Service: Open Heart Surgery;  Laterality: N/A;   UPPER GI ENDOSCOPY  2013   Gastritis; Dr Carlean Purl   VASECTOMY      Family History  Problem Relation Age of Onset   Stroke Father    Hypertension Father    Pancreatic cancer Mother     Diabetes Maternal Grandmother    Stroke Maternal Grandmother    Heart attack Paternal Grandmother    Colon cancer Neg Hx    Esophageal cancer Neg Hx    Rectal cancer Neg Hx    Stomach cancer Neg Hx    Ulcers Neg Hx     SOCIAL HISTORY: Social History   Socioeconomic History   Marital status: Divorced    Spouse name: Not on file   Number of children: 2   Years of education: college   Highest education level: Not on file  Occupational History    Employer: RETIRED  Tobacco Use   Smoking status: Never   Smokeless tobacco: Never  Vaping Use   Vaping Use: Never used  Substance and Sexual Activity   Alcohol use: No    Alcohol/week: 0.0  standard drinks    Comment: "last drink was in 2012"( 03/15/2014)   Drug use: No   Sexual activity: Not Currently  Other Topics Concern   Not on file  Social History Narrative   Divorced, lives with a roommate.   1 son one daughter   3-4 caffeinated beverages daily   Right-handed.   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   Social Determinants of Health   Financial Resource Strain: Low Risk    Difficulty of Paying Living Expenses: Not hard at all  Food Insecurity: Not on file  Transportation Needs: Not on file  Physical Activity: Not on file  Stress: Not on file  Social Connections: Not on file  Intimate Partner Violence: Not on file    Allergies  Allergen Reactions   Benazepril Swelling    angioedema; he is not a candidate for any angiotensin receptor blockers because of this significant allergic reaction. Because of a history of documented adverse serious drug reaction;Medi Alert bracelet  is recommended   Hctz [Hydrochlorothiazide] Anaphylaxis and Swelling    Tongue and lip swelling    Aspirin Other (See Comments)    Gastritis, cant take 325 Mg aspirin    Lactose Intolerance (Gi) Nausea And Vomiting     Current Outpatient Medications  Medication Sig Dispense Refill   Acetaminophen (TYLENOL) 325 MG CAPS Tylenol     allopurinol (ZYLOPRIM) 300 MG tablet Take 450 mg by mouth daily.     aluminum hydroxide-magnesium carbonate (GAVISCON) 95-358 MG/15ML SUSP Take 15 mLs by mouth as needed for indigestion or heartburn.     amLODipine (NORVASC) 5 MG tablet TAKE 1 TABLET BY MOUTH TWICE A DAY 180 tablet 3   apixaban (ELIQUIS) 2.5 MG TABS tablet Take 1 tablet (2.5 mg total) by mouth 2 (two) times daily. 180 tablet 1   atorvastatin (LIPITOR) 80 MG tablet TAKE ONE TABLET BY MOUTH AT BEDTIME 90 tablet 3   azelastine (ASTELIN) 0.1 % nasal spray Place 2 sprays into both nostrils at bedtime as needed for rhinitis or allergies. 30 mL 12   esomeprazole (NEXIUM) 40 MG capsule Take 1 capsule (40 mg total) by mouth daily. 30 capsule 5   ezetimibe (ZETIA) 10 MG tablet TAKE 1 TABLET BY MOUTH EVERY DAY 90 tablet 3   fenofibrate 160 MG tablet Take 1 tablet (160 mg total) by mouth daily. 90 tablet 0   folic acid (FOLVITE) 1 MG tablet TAKE 2 TABLETS BY MOUTH EVERY DAY 180 tablet 4   FREESTYLE LITE test strip USE TO TEST BLOOD SUGAR ONCE A DAY. DX CODE: E11.9 100 strip 12   glimepiride (AMARYL) 1 MG tablet TAKE 1 TABLET BY MOUTH DAILY WITH BREAKFAST. 90 tablet 1   Lancets (FREESTYLE) lancets USE TWICE A DAY TO CHECK BLOOD SUGAR. DX E11.9 100 each 6   metFORMIN (GLUCOPHAGE) 1000 MG tablet TAKE 1 & 1/2 TABLETS BY MOUTH EVERY MORNING AND 1 TABLET IN THE EVENING. 225 tablet 1   metoprolol tartrate (LOPRESSOR) 25 MG tablet TAKE ONE-HALF TABLET BY MOUTH TWICE DAILY 90 tablet 3   nitroGLYCERIN (NITROSTAT) 0.4 MG SL tablet PLACE 1 TABLET (0.4 MG TOTAL) UNDER THE TONGUE EVERY 5 (FIVE) MINUTES AS NEEDED FOR CHEST PAIN. 25 tablet 6   polycarbophil (FIBERCON) 625 MG tablet Take 625 mg by mouth daily.     potassium chloride SA (KLOR-CON M20) 20 MEQ tablet Take  2 tablets (40 mEq total) by mouth 2 (two) times daily. 360 tablet 3    pregabalin (LYRICA) 150 MG capsule TAKE 1 CAPSULE BY MOUTH TWICE A DAY 180 capsule 1   psyllium (METAMUCIL) 58.6 % powder Take 1 packet by mouth daily as needed (constipation).     torsemide (DEMADEX) 20 MG tablet Take 20 mg by mouth daily.     VASCEPA 1 g capsule TAKE 2 CAPSULES BY MOUTH TWICE A DAY 120 capsule 5   No current facility-administered medications for this visit.   Facility-Administered Medications Ordered in Other Visits  Medication Dose Route Frequency Provider Last Rate Last Admin   sodium chloride flush (NS) 0.9 % injection 10 mL  10 mL Intravenous PRN [name], MD   10 mL at 05/30/17 0920    ROS:   General:  No weight loss, Fever, chills  HEENT: No recent headaches, no nasal bleeding, no visual changes, no sore throat  Neurologic: No dizziness, blackouts, seizures. No recent symptoms of stroke or mini- stroke. No recent episodes of slurred speech, or temporary blindness.  Cardiac: No recent episodes of chest pain/pressure, no shortness of breath at rest.  No shortness of breath with exertion.  Denies history of atrial fibrillation or irregular heartbeat  Vascular: No history of rest pain in feet.  No history of claudication.  No history of non-healing ulcer, No history of DVT   Pulmonary: No home oxygen, no productive cough, no hemoptysis,  No asthma or wheezing  Musculoskeletal:  [ ]  Arthritis, [ ]  Low back pain,  [ ]  Joint pain  Hematologic:No history of hypercoagulable state.  No history of easy bleeding.  No history of anemia  Gastrointestinal: No hematochezia or melena,  No gastroesophageal reflux, no trouble swallowing  Urinary: [ ]  chronic Kidney disease, [ ]  on HD - [ ]  MWF or [ ]  TTHS, [ ]  Burning with urination, [ ]  Frequent urination, [ ]  Difficulty urinating;   Skin: No rashes  Psychological: No history of anxiety,  No history of depression   Physical Examination  Vitals:   12/21/20 1441  BP: (!) 147/69  Resp: 20  Temp: 98.5 F (36.9  C)  SpO2: 94%  Weight: 245 lb (111.1 kg)  Height: 6' (1.829 m)    Body mass index is 33.23 kg/m.  General:  Alert and oriented, no acute distress HEENT: Normal Neck: No bruit or JVD Pulmonary: Clear to auscultation bilaterally, large network of chest wall collaterals extending up into the left neck and left anterior chest, left side pacemaker, right side Port-A-Cath Cardiac: Regular Rate and Rhythm  Abdomen: Soft, non-tender, non-distended, no mass, no scars Skin: No rash Extremity Pulses:  2+ radial, brachial pulses bilaterally Musculoskeletal: No deformity diffuse left upper extremity edema approximately 50% larger than the right arm Neurologic: Upper and lower extremity motor 5/5 and symmetric  DATA:  Patient recently had a left upper extremity DVT ultrasound which was negative.  Patient recently had a CT scan of the chest which showed no mass-effect on the left side axillary vein not well visualized  ASSESSMENT: Left arm swelling most likely secondary to left subclavian vein severe stenosis or occlusion secondary to pacing wires.  He may also have a component of lymphedema from removal of axillary lymph nodes but this is probably less likely considering his lymph node biopsy was 4 years ago and he has only fairly recent symptoms.  I discussed with the patient the possibility of a central venogram and intervention however  this most likely would be of limited durability due to the indwelling pacing wires and could potentially injure the conduction system of the pacing wires with the angioplasty.  The patient so far has fairly minimal symptoms from this and does not feel debilitated by this.  I believe the best option at this point would be to fit him for a left upper extremity compression garment to control swelling symptoms.  If his symptoms worsen over time and he feels this is more lifestyle limiting we could consider a central venogram.  However in light of his age any procedure  would be of higher risk may be only of minimal durability.  If he develops any infections in his left upper extremity this should be treated very aggressively since he has some lymphatic obstruction probably as well as venous outflow obstruction.   PLAN: Patient was given a prescription today for a left upper extremity compression garment.  He will follow-up on an as-needed basis.  Recommendations and diagnoses listed above.   Ruta Hinds, MD [facility] Office: [phone]

## 2020-12-24 ENCOUNTER — Telehealth: Payer: Self-pay | Admitting: Emergency Medicine

## 2020-12-24 ENCOUNTER — Inpatient Hospital Stay: Admission: RE | Admit: 2020-12-24 | Payer: Medicare Other | Source: Ambulatory Visit

## 2020-12-24 ENCOUNTER — Emergency Department
Admission: EM | Admit: 2020-12-24 | Discharge: 2020-12-25 | Disposition: A | Discharge status: Home / Self Care | Payer: Medicare Other | Source: Home / Self Care

## 2020-12-24 ENCOUNTER — Other Ambulatory Visit: Payer: Self-pay

## 2020-12-24 ENCOUNTER — Emergency Department (INDEPENDENT_AMBULATORY_CARE_PROVIDER_SITE_OTHER)
Admission: EM | Admit: 2020-12-24 | Discharge: 2020-12-24 | Disposition: A | Discharge status: Home / Self Care | Payer: Medicare Other | Source: Home / Self Care

## 2020-12-24 DIAGNOSIS — R531 Weakness: Secondary | ICD-10-CM | POA: Diagnosis not present

## 2020-12-24 DIAGNOSIS — J9811 Atelectasis: Secondary | ICD-10-CM | POA: Diagnosis not present

## 2020-12-24 DIAGNOSIS — R231 Pallor: Secondary | ICD-10-CM | POA: Diagnosis not present

## 2020-12-24 DIAGNOSIS — E1165 Type 2 diabetes mellitus with hyperglycemia: Secondary | ICD-10-CM | POA: Diagnosis not present

## 2020-12-24 DIAGNOSIS — I2510 Atherosclerotic heart disease of native coronary artery without angina pectoris: Secondary | ICD-10-CM | POA: Diagnosis not present

## 2020-12-24 DIAGNOSIS — E785 Hyperlipidemia, unspecified: Secondary | ICD-10-CM | POA: Diagnosis not present

## 2020-12-24 DIAGNOSIS — Z955 Presence of coronary angioplasty implant and graft: Secondary | ICD-10-CM | POA: Diagnosis not present

## 2020-12-24 DIAGNOSIS — B349 Viral infection, unspecified: Secondary | ICD-10-CM

## 2020-12-24 DIAGNOSIS — I700 Atherosclerosis of aorta: Secondary | ICD-10-CM | POA: Diagnosis not present

## 2020-12-24 DIAGNOSIS — K5900 Constipation, unspecified: Secondary | ICD-10-CM | POA: Diagnosis not present

## 2020-12-24 DIAGNOSIS — K219 Gastro-esophageal reflux disease without esophagitis: Secondary | ICD-10-CM | POA: Diagnosis not present

## 2020-12-24 DIAGNOSIS — N3289 Other specified disorders of bladder: Secondary | ICD-10-CM | POA: Diagnosis not present

## 2020-12-24 DIAGNOSIS — R404 Transient alteration of awareness: Secondary | ICD-10-CM | POA: Diagnosis not present

## 2020-12-24 DIAGNOSIS — G9341 Metabolic encephalopathy: Secondary | ICD-10-CM | POA: Diagnosis not present

## 2020-12-24 DIAGNOSIS — S81812A Laceration without foreign body, left lower leg, initial encounter: Secondary | ICD-10-CM | POA: Diagnosis not present

## 2020-12-24 DIAGNOSIS — R4020 Unspecified coma: Secondary | ICD-10-CM | POA: Diagnosis not present

## 2020-12-24 DIAGNOSIS — R000 Tachycardia, unspecified: Secondary | ICD-10-CM

## 2020-12-24 DIAGNOSIS — Z951 Presence of aortocoronary bypass graft: Secondary | ICD-10-CM | POA: Diagnosis not present

## 2020-12-24 DIAGNOSIS — R4182 Altered mental status, unspecified: Secondary | ICD-10-CM | POA: Diagnosis not present

## 2020-12-24 DIAGNOSIS — R0989 Other specified symptoms and signs involving the circulatory and respiratory systems: Secondary | ICD-10-CM | POA: Diagnosis not present

## 2020-12-24 DIAGNOSIS — I10 Essential (primary) hypertension: Secondary | ICD-10-CM | POA: Diagnosis not present

## 2020-12-24 DIAGNOSIS — S0990XA Unspecified injury of head, initial encounter: Secondary | ICD-10-CM | POA: Diagnosis not present

## 2020-12-24 DIAGNOSIS — R509 Fever, unspecified: Secondary | ICD-10-CM

## 2020-12-24 DIAGNOSIS — R58 Hemorrhage, not elsewhere classified: Secondary | ICD-10-CM | POA: Diagnosis not present

## 2020-12-24 DIAGNOSIS — Z9989 Dependence on other enabling machines and devices: Secondary | ICD-10-CM | POA: Diagnosis not present

## 2020-12-24 DIAGNOSIS — R410 Disorientation, unspecified: Secondary | ICD-10-CM | POA: Diagnosis not present

## 2020-12-24 DIAGNOSIS — Z20822 Contact with and (suspected) exposure to covid-19: Secondary | ICD-10-CM | POA: Diagnosis not present

## 2020-12-24 DIAGNOSIS — K5730 Diverticulosis of large intestine without perforation or abscess without bleeding: Secondary | ICD-10-CM | POA: Diagnosis not present

## 2020-12-24 DIAGNOSIS — Z952 Presence of prosthetic heart valve: Secondary | ICD-10-CM | POA: Diagnosis not present

## 2020-12-24 DIAGNOSIS — A419 Sepsis, unspecified organism: Secondary | ICD-10-CM | POA: Diagnosis not present

## 2020-12-24 DIAGNOSIS — G4733 Obstructive sleep apnea (adult) (pediatric): Secondary | ICD-10-CM | POA: Diagnosis not present

## 2020-12-24 LAB — POC SARS CORONAVIRUS 2 AG -  ED: SARS Coronavirus 2 Ag: NEGATIVE

## 2020-12-24 MED ORDER — SODIUM CHLORIDE 0.9 % IV SOLN: INTRAVENOUS | Status: AC

## 2020-12-24 NOTE — ED Provider Notes (Signed)
[facility]    CSN: [id number] Arrival date & time: 12/24/20  0956      History   Chief Complaint Chief Complaint  Patient presents with   Fever   Covid Exposure    HPI Richard [name] is a 80 y.o. male.   HPI 80 year old male presents with chills, body aches, and fever this morning, currently 101.2.  PMH significant for severe aortic stenosis, CABG, complete heart block, cardiac pacemaker, acute on chronic heart failure, and recovering cancer patient.  Patient reports taking 1000 mg of Tylenol at 8 AM this morning and reports that he lives alone.  Patient CMP from cancer center on 12/20/2020 revealed creatinine of 1.51 and GFR of 46.  Past Medical History:  Diagnosis Date   Anemia    Arthritis    hips   Axillary adenopathy 02/25/2017   Bradycardia    a. holter monitor has demonstrated HRs in 30s and [place]    CAD (coronary artery disease)    a. s/p CABG 2001  b.  07/28/2017 cath:   Severe three-vessel native CAD with total occlusion of LAD, ramus intermedius, first OM and RCA, patent RIMA to PDA, LIMA to LAD, sequential SVG to ramus intermedius and first OM.     Chronic lower back pain    Diffuse large B cell lymphoma (HCC)    Diverticulosis    Esophageal stricture    GERD (gastroesophageal reflux disease)    History of gout    HTN (hypertension)    Mixed hyperlipidemia    OSA on CPAP    with 2L O2 at night   Pancytopenia ([place])    a. related to chemo therapy for B cell lymphoma   Peptic stricture of esophagus    Presence of permanent cardiac pacemaker    sees Dr. Valaria Good pacemaker   Severe aortic stenosis    Spinal stenosis    Squamous cell carcinoma of skin 03/22/2009   Right mandible. SCCis, hypertrophic.    Type II diabetes mellitus ([place])    Wears dentures    partial upper    Patient Active Problem List   Diagnosis Date Noted   Cardiac pacemaker in situ 11/20/2020   S/P CABG x 4 11/20/2020   Ascending aortic aneurysm ([place])  11/20/2020   Senile purpura ([place]) 07/27/2020   Porokeratosis 06/20/2020   Pain due to onychomycosis of toenails of both feet 12/14/2019   Educated about COVID-19 virus infection 11/25/2019   Uncontrolled type 2 diabetes mellitus with hypoglycemia without coma ([place]) 08/10/2019   Hyperlipidemia associated with type 2 diabetes mellitus ([place]) 08/10/2019   S/P left TKA 06/29/2019   Peripheral neuropathy 07/27/2018   Uncontrolled type 2 diabetes mellitus with hyperglycemia ([place]) 05/13/2018   Diabetic peripheral neuropathy associated with type 2 diabetes mellitus ([place]) 05/13/2018   Pansinusitis 05/13/2018   Severe aortic stenosis    Complete heart block (HCC)    Paroxysmal atrial fibrillation (HCC)    Acute on chronic diastolic CHF (congestive heart failure), NYHA class 2 (HCC)    Left arm swelling    Status post transcatheter aortic valve replacement (TAVR) using bioprosthesis 09/09/2017   Demand ischemia of myocardium (HCC)    Typical atrial flutter (HCC)    Acute on chronic diastolic heart failure (HCC)    GIB (gastrointestinal bleeding) 08/16/2017   Hyperlipidemia 08/05/2017   NSTEMI (non-ST elevated myocardial infarction) ([place])    Aortic stenosis, severe 07/25/2017   Pancytopenia ([place]) 07/24/2017   Diffuse large B-cell lymphoma of intrathoracic lymph  nodes ([place]) 02/27/2017   Bradycardia 01/04/2017   Coronary artery disease 01/04/2017   Stenosis of carotid artery 01/04/2017   Obesity 09/18/2016   Wenckebach block    DM (diabetes mellitus) type II uncontrolled, periph vascular disorder ([place]) 05/21/2013   Spinal stenosis of lumbar region 04/08/2013   Anemia, iron deficiency 11/29/2011   B12 deficiency anemia 07/30/2011   OSA (obstructive sleep apnea) 07/06/2010   CAD, ARTERY BYPASS GRAFT 08/22/2009   Essential hypertension 03/29/2009   Bilateral lower extremity edema 02/21/2009   Hyperlipidemia LDL goal <70 11/26/2008    Past Surgical History:  Procedure Laterality Date    APPENDECTOMY  ~ [place] W/PHACO Left 11/06/2016   Procedure: CATARACT EXTRACTION PHACO AND INTRAOCULAR LENS PLACEMENT ([place]);  Surgeon: Estill Cotta, MD;  Location: [facility];  Service: Ophthalmology;  Laterality: Left;  Lot # [id number] H [place]: 01:09.4 AP%:25.2 CDE: 30.64   CATARACT EXTRACTION W/PHACO Right 12/04/2016   Procedure: CATARACT EXTRACTION PHACO AND INTRAOCULAR LENS PLACEMENT (IOC);  Surgeon: Estill Cotta, MD;  Location: [facility];  Service: Ophthalmology;  Laterality: Right;  [place] 1:25.9 AP% 24.1 CDE 39.10 Fluid Pack lot # [id number]   COLONOSCOPY W/ BIOPSIES AND POLYPECTOMY  2013   CORONARY ANGIOPLASTY  1993   CORONARY ANGIOPLASTY WITH STENT PLACEMENT  05/1997   "1"   CORONARY ARTERY BYPASS GRAFT  03/2000   "CABG X5"   ECTROPION REPAIR Right 09/01/2018   Procedure: REPAIR OF ECTROPION BILATERAL upper and lower;  Surgeon: Karle Starch, MD;  Location: [facility];  Service: Ophthalmology;  Laterality: Right;  Diabetic - oral meds sleep apnea   ESOPHAGEAL DILATION  X 3-4   Dr. Lyla Son; "last one was in the 1990's"   ESOPHAGOGASTRODUODENOSCOPY     multiple   FLEXIBLE SIGMOIDOSCOPY     multiple   HYDRADENITIS EXCISION Left 02/25/2017   Procedure: EXCISION DEEP LEFT AXILLARY LYMPH NODE;  Surgeon: Fanny Skates, MD;  Location: [facility];  Service: General;  Laterality: Left;   INTRAOPERATIVE TRANSTHORACIC ECHOCARDIOGRAM N/A 09/09/2017   Procedure: INTRAOPERATIVE TRANSTHORACIC ECHOCARDIOGRAM;  Surgeon: Burnell Blanks, MD;  Location: [facility];  Service: Open Heart Surgery;  Laterality: N/A;   KNEE ARTHROSCOPY Left 2011   meniscus repair   LEFT HEART CATHETERIZATION WITH CORONARY/GRAFT ANGIOGRAM N/A 03/16/2014   Procedure: LEFT HEART CATHETERIZATION WITH Beatrix Fetters;  Surgeon: Burnell Blanks, MD;  Location: [facility] CATH LAB;  Service: Cardiovascular;  Laterality: N/A;   LUMBAR LAMINECTOMY/DECOMPRESSION MICRODISCECTOMY  Right 06/17/2013   Procedure: LUMBAR LAMINECTOMY MICRODISCECTOMY L4-L5 RIGHT EXCISION OF SYNOVIAL CYST RIGHT   (1 LEVEL) RIGHT PARTIAL FACETECTOMY;  Surgeon: Tobi Bastos, MD;  Location: [facility];  Service: Orthopedics;  Laterality: Right;   MYELOGRAM  04/06/13   lumbar, Dr Gladstone Lighter   ORBITAL LESION EXCISION Right 09/01/2018   Procedure: ORBITOTOMY WITHOUT BONE FLAP WITH REMOVAL OF LESION RIGHT;  Surgeon: Karle Starch, MD;  Location: [facility];  Service: Ophthalmology;  Laterality: Right;   PACEMAKER IMPLANT N/A 09/10/2017   Procedure: PACEMAKER IMPLANT;  Surgeon: Deboraha Sprang, MD;  Location: [facility] LAB;  Service: Cardiovascular;  Laterality: N/A;   PANENDOSCOPY     PORTACATH PLACEMENT N/A 03/06/2017   Procedure: INSERTION PORT-A-CATH AND ASPIRATE SEROMA LEFT AXILLA;  Surgeon: Fanny Skates, MD;  Location: [facility];  Service: General;  Laterality: N/A;   RIGHT/LEFT HEART CATH AND CORONARY/GRAFT ANGIOGRAPHY N/A 07/28/2017   Procedure: RIGHT/LEFT HEART CATH AND CORONARY/GRAFT ANGIOGRAPHY;  Surgeon: Sherren Mocha, MD;  Location: [facility] LAB;  Service: Cardiovascular;  Laterality: N/A;   SHOULDER SURGERY Right 08/2010   screws placed; "tendons tore off"   SKIN CANCER EXCISION Right    "neck"   TONSILLECTOMY  ~ [place] Left 06/29/2019   Procedure: TOTAL KNEE ARTHROPLASTY;  Surgeon: Paralee Cancel, MD;  [facility];  Service: Orthopedics;  Laterality: Left;  70 mins   TRANSCATHETER AORTIC VALVE REPLACEMENT, TRANSFEMORAL N/A 09/09/2017   Procedure: TRANSCATHETER AORTIC VALVE REPLACEMENT, TRANSFEMORAL;  Surgeon: Burnell Blanks, MD;  Location: [facility];  Service: Open Heart Surgery;  Laterality: N/A;   UPPER GI ENDOSCOPY  2013   Gastritis; Dr Carlean Purl   VASECTOMY         Home Medications    Prior to Admission medications   Medication Sig Start Date End Date Taking? Authorizing Provider  Acetaminophen (TYLENOL) 325 MG CAPS Tylenol     [provider]  allopurinol (ZYLOPRIM) 300 MG tablet Take 450 mg by mouth daily.    [provider]  aluminum hydroxide-magnesium carbonate (GAVISCON) 95-358 MG/15ML SUSP Take 15 mLs by mouth as needed for indigestion or heartburn.    [provider]  amLODipine (NORVASC) 5 MG tablet TAKE 1 TABLET BY MOUTH TWICE A DAY 12/08/19   Minus Breeding, MD  apixaban (ELIQUIS) 2.5 MG TABS tablet Take 1 tablet (2.5 mg total) by mouth 2 (two) times daily. 10/04/20   Shirley Friar, PA-C  atorvastatin (LIPITOR) 80 MG tablet TAKE ONE TABLET BY MOUTH AT BEDTIME 12/02/19   Minus Breeding, MD  azelastine (ASTELIN) 0.1 % nasal spray Place 2 sprays into both nostrils at bedtime as needed for rhinitis or allergies. 06/12/20   [name], DO  esomeprazole (NEXIUM) 40 MG capsule Take 1 capsule (40 mg total) by mouth daily. 10/15/12   [name], MD  ezetimibe (ZETIA) 10 MG tablet TAKE 1 TABLET BY MOUTH EVERY DAY 01/20/20   Minus Breeding, MD  fenofibrate 160 MG tablet Take 1 tablet (160 mg total) by mouth daily. 11/16/20   Roma Schanz R, DO  folic acid (FOLVITE) 1 MG tablet TAKE 2 TABLETS BY MOUTH EVERY DAY 04/12/20   [name], MD  FREESTYLE LITE test strip USE TO TEST BLOOD SUGAR ONCE A DAY. DX CODE: E11.9 05/01/20   Roma Schanz R, DO  glimepiride (AMARYL) 1 MG tablet TAKE 1 TABLET BY MOUTH DAILY WITH BREAKFAST. 12/11/20   Roma Schanz R, DO  Lancets (FREESTYLE) lancets USE TWICE A DAY TO CHECK BLOOD SUGAR. DX E11.9 09/12/20   Carollee Herter, Kendrick Fries R, DO  metFORMIN (GLUCOPHAGE) 1000 MG tablet TAKE 1 & 1/2 TABLETS BY MOUTH EVERY MORNING AND 1 TABLET IN THE EVENING. 10/30/20   Carollee Herter, Alferd Apa, DO  metoprolol tartrate (LOPRESSOR) 25 MG tablet TAKE ONE-HALF TABLET BY MOUTH TWICE DAILY 01/13/20   Minus Breeding, MD  nitroGLYCERIN (NITROSTAT) 0.4 MG SL tablet PLACE 1 TABLET (0.4 MG TOTAL) UNDER THE TONGUE EVERY 5 (FIVE) MINUTES AS NEEDED FOR CHEST  PAIN. 12/17/19   Minus Breeding, MD  polycarbophil (FIBERCON) 625 MG tablet Take 625 mg by mouth daily.    [provider]  potassium chloride SA (KLOR-CON M20) 20 MEQ tablet Take 2 tablets (40 mEq total) by mouth 2 (two) times daily. 03/21/20   Minus Breeding, MD  pregabalin (LYRICA) 150 MG capsule TAKE 1 CAPSULE BY MOUTH TWICE A DAY 08/07/20  Carollee Herter, Yvonne R, DO  psyllium (METAMUCIL) 58.6 % powder Take 1 packet by mouth daily as needed (constipation).    [provider]  torsemide (DEMADEX) 20 MG tablet Take 20 mg by mouth daily. 04/27/20   [provider]  VASCEPA 1 g capsule TAKE 2 CAPSULES BY MOUTH TWICE A DAY 12/04/20   [name], DO    Family History Family History  Problem Relation Age of Onset   Stroke Father    Hypertension Father    Pancreatic cancer Mother    Diabetes Maternal Grandmother    Stroke Maternal Grandmother    Heart attack Paternal Grandmother    Colon cancer Neg Hx    Esophageal cancer Neg Hx    Rectal cancer Neg Hx    Stomach cancer Neg Hx    Ulcers Neg Hx     Social History Social History   Tobacco Use   Smoking status: Never   Smokeless tobacco: Never  Vaping Use   Vaping Use: Never used  Substance Use Topics   Alcohol use: No    Alcohol/week: 0.0 standard drinks    Comment: "last drink was in 2012"( 03/15/2014)   Drug use: No     Allergies   Benazepril, Hctz [hydrochlorothiazide], Aspirin, and Lactose intolerance (gi)   Review of Systems Review of Systems  Constitutional:  Positive for chills and fever.  HENT: Negative.    Eyes: Negative.   Respiratory: Negative.    Cardiovascular: Negative.   Gastrointestinal: Negative.   Genitourinary: Negative.   Musculoskeletal:  Positive for myalgias.  Skin: Negative.   Neurological: Negative.     Physical Exam Triage Vital Signs ED Triage Vitals  Enc Vitals Group     BP 12/24/20 1010 127/72     Pulse Rate 12/24/20 1010 (!) 126     Resp 12/24/20  1010 (!) 24     Temp 12/24/20 1010 (!) 101.2 F (38.4 C)     Temp Source 12/24/20 1010 Oral     SpO2 12/24/20 1010 94 %     Weight 12/24/20 1012 243 lb (110.2 kg)     Height 12/24/20 1012 6' (1.829 m)     Head Circumference --      Peak Flow --      Pain Score 12/24/20 1012 4     Pain Loc --      Pain Edu? --      Excl. in [place]? --    No data found.  Updated Vital Signs BP 127/72 (BP Location: Right Arm)   Pulse (!) 126   Temp (!) 101.2 F (38.4 C) (Oral)   Resp (!) 24   Ht 6' (1.829 m)   Wt 243 lb (110.2 kg)   SpO2 94%   BMI 32.96 kg/m      Physical Exam Vitals and nursing note reviewed.  Constitutional:      General: He is not in acute distress.    Appearance: He is obese. He is ill-appearing.  HENT:     Head: Normocephalic and atraumatic.     Right Ear: Tympanic membrane and ear canal normal.     Left Ear: Tympanic membrane and ear canal normal.     Mouth/Throat:     Mouth: Mucous membranes are moist.     Pharynx: Oropharynx is clear.  Eyes:     Extraocular Movements: Extraocular movements intact.     Conjunctiva/sclera: Conjunctivae normal.     Pupils: Pupils are equal, round, and reactive  to light.  Neck:     Comments: No JVD, no bruit Cardiovascular:     Rate and Rhythm: Tachycardia present.     Pulses: Normal pulses.     Heart sounds: Normal heart sounds.     Comments: 3/6 HSEM noted Pulmonary:     Effort: Pulmonary effort is normal. No respiratory distress.     Breath sounds: No wheezing, rhonchi or rales.     Comments: Diminished breath sounds noted throughout, respirations 28-30 on exam Musculoskeletal:        General: Normal range of motion.     Cervical back: Normal range of motion and neck supple. No tenderness.  Lymphadenopathy:     Cervical: Cervical adenopathy present.  Skin:    General: Skin is warm and dry.  Neurological:     General: No focal deficit present.     Mental Status: He is alert and oriented to person, place, and time.   Psychiatric:        Mood and Affect: Mood normal.        Behavior: Behavior normal.     UC Treatments / Results  Labs (all labs ordered are listed, but only abnormal results are displayed) Labs Reviewed  POC SARS CORONAVIRUS 2 AG -  ED    EKG   Radiology No results found.  Procedures Procedures (including critical care time)  Medications Ordered in UC Medications - No data to display  Initial Impression / Assessment and Plan / UC Course  I have reviewed the triage vital signs and the nursing notes.  Pertinent labs & imaging results that were available during my care of the patient were reviewed by me and considered in my medical decision making (see chart for details).     MDM: 1.  Fever, 2.  Viral illness, 3.  Tachycardia.  Patient discharged and advised to go to [place] for septic work-up given PMH/current comorbidities.  Patient agreed and verbalized understanding of these instructions and plan of care today. Final Clinical Impressions(s) / UC Diagnoses   Final diagnoses:  Fever, unspecified  Viral illness  Tachycardia     Discharge Instructions      Instructed patient to go to [place] for septic work-up given PMH/current comorbidities.  Patient agreed and verbalized understanding of these instructions and plan of care today.     ED Prescriptions   None    PDMP not reviewed this encounter.   Eliezer Lofts, [place] 12/24/20 1059

## 2020-12-24 NOTE — Discharge Instructions (Addendum)
Instructed patient to go to [place] for septic work-up given PMH/current comorbidities.  Patient agreed and verbalized understanding of these instructions and plan of care today.

## 2020-12-24 NOTE — ED Triage Notes (Addendum)
Pt woke up this am w/ chills, body aches and fever  Did not check temp at home - took 1000mg  Tylenol at 0800 O2 sats run 95% at home  No home O2  OSA  w/ CPAP  Had an appointment at 2pm , asked to come in earlier - pt lives alone  Pt has a pacemaker COVID vaccine + 2 boosters

## 2020-12-24 NOTE — Telephone Encounter (Signed)
See triage encounter.

## 2020-12-24 NOTE — ED Notes (Addendum)
Patient is being discharged from the Urgent Care and sent to the Emergency Department via POV. Per M. Ragan, [place]  patient is in need of higher level of care due to febrile status & potential for sepsis.Rapid COVID test was negative - no PCR send out done  Patient is aware and verbalizes understanding of plan of care. Pt's sister & his son called to update.  lives alone.  Vitals:   12/24/20 1010  BP: 127/72  Pulse: (!) 126  Resp: (!) 24  Temp: (!) 101.2 F (38.4 C)  SpO2: 94%   RN attempted to call report to [facility] triage - no answer

## 2020-12-24 NOTE — ED Triage Notes (Addendum)
See ED note - pt triaged in parking lot

## 2020-12-24 NOTE — ED Notes (Signed)
Call from pt's sister at 1221 to see if pt had come back to [facility]- pt had never arrived at [facility]. Pt had refused EMS transport earlier. Pt found in security guard's car by security at 1319. Sister here- RN & CMA  to car at 1321 pt had been placed in a w/c by security guard and pt access. Pt hot to touch, skin flushed. Skin tears noted to BLE  & LUE Temp checked orally, switched over to monitor mode. Oral temp 106. Last tylenol at 0800 at home, pt had been d/c from [facility] earlier at 1055, AAO x 3 - pt unable to take ibuprofen due to being blood thinners. Pt's O2 sat on 3 L  is 91%. Room air sat is normally 94-95%. HR 120. Per sister pt had driven to [facility], became lost on the way and never arrived. Pt also has a life alert necklace, but did not activate it per family. EMS called at 1329 by RN  to transport pt. EMS arrived at 1345. CBG checked -204 at 1340. Pt able to open eyes, unable to follow commands or stand at this time - unable to do a stroke assessment due to pt's status. Skin tears noted to BUE & LLE. Pt transported to stretcher by EMS & [facility] staff. Pt remains on O2. Pt unable to sip water at this time, PO tylenol deferred due to aspiration risk. No PR tylenol available. [place] Ragan, NP to assist at 1345 - see orders (RBVO) PIV started in ambulance by RN at 1350 after packing pt in ice packs to groin and axilla & head. 2 attempts, no blood return to L AC, + blood return to R AC fossa, flushed, Normal saline 1 liter started per NP wide open at 1350 until EMS left to transport pt to [facility]. EMS team unable to transport with fluids or RN. Pt to [facility] for IV tylenol and sepsis workup. Family (sister-Laura & Benjie Karvonen -granddaughter) in parking lot - updated. Pt to [facility] due to proximity. EMS left w/ pt at 1359.

## 2020-12-25 ENCOUNTER — Encounter: Payer: Self-pay | Admitting: Vascular Surgery

## 2020-12-25 ENCOUNTER — Encounter: Payer: Self-pay | Admitting: Family

## 2020-12-25 DIAGNOSIS — I872 Venous insufficiency (chronic) (peripheral): Secondary | ICD-10-CM | POA: Diagnosis present

## 2020-12-25 DIAGNOSIS — Z8249 Family history of ischemic heart disease and other diseases of the circulatory system: Secondary | ICD-10-CM | POA: Diagnosis not present

## 2020-12-25 DIAGNOSIS — Z955 Presence of coronary angioplasty implant and graft: Secondary | ICD-10-CM | POA: Diagnosis not present

## 2020-12-25 DIAGNOSIS — M109 Gout, unspecified: Secondary | ICD-10-CM | POA: Diagnosis present

## 2020-12-25 DIAGNOSIS — G4733 Obstructive sleep apnea (adult) (pediatric): Secondary | ICD-10-CM | POA: Diagnosis present

## 2020-12-25 DIAGNOSIS — K219 Gastro-esophageal reflux disease without esophagitis: Secondary | ICD-10-CM | POA: Diagnosis present

## 2020-12-25 DIAGNOSIS — G9341 Metabolic encephalopathy: Secondary | ICD-10-CM | POA: Diagnosis present

## 2020-12-25 DIAGNOSIS — M791 Myalgia, unspecified site: Secondary | ICD-10-CM | POA: Diagnosis present

## 2020-12-25 DIAGNOSIS — Z952 Presence of prosthetic heart valve: Secondary | ICD-10-CM | POA: Diagnosis not present

## 2020-12-25 DIAGNOSIS — Z20822 Contact with and (suspected) exposure to covid-19: Secondary | ICD-10-CM | POA: Diagnosis present

## 2020-12-25 DIAGNOSIS — E1165 Type 2 diabetes mellitus with hyperglycemia: Secondary | ICD-10-CM | POA: Diagnosis present

## 2020-12-25 DIAGNOSIS — I2510 Atherosclerotic heart disease of native coronary artery without angina pectoris: Secondary | ICD-10-CM | POA: Diagnosis present

## 2020-12-25 DIAGNOSIS — N400 Enlarged prostate without lower urinary tract symptoms: Secondary | ICD-10-CM | POA: Diagnosis present

## 2020-12-25 DIAGNOSIS — E1140 Type 2 diabetes mellitus with diabetic neuropathy, unspecified: Secondary | ICD-10-CM | POA: Diagnosis present

## 2020-12-25 DIAGNOSIS — I10 Essential (primary) hypertension: Secondary | ICD-10-CM | POA: Diagnosis present

## 2020-12-25 DIAGNOSIS — Z961 Presence of intraocular lens: Secondary | ICD-10-CM | POA: Diagnosis present

## 2020-12-25 DIAGNOSIS — J9811 Atelectasis: Secondary | ICD-10-CM | POA: Diagnosis not present

## 2020-12-25 DIAGNOSIS — N419 Inflammatory disease of prostate, unspecified: Secondary | ICD-10-CM | POA: Diagnosis present

## 2020-12-25 DIAGNOSIS — K5730 Diverticulosis of large intestine without perforation or abscess without bleeding: Secondary | ICD-10-CM | POA: Diagnosis present

## 2020-12-25 DIAGNOSIS — Z951 Presence of aortocoronary bypass graft: Secondary | ICD-10-CM | POA: Diagnosis not present

## 2020-12-25 DIAGNOSIS — E785 Hyperlipidemia, unspecified: Secondary | ICD-10-CM | POA: Diagnosis present

## 2020-12-25 DIAGNOSIS — I700 Atherosclerosis of aorta: Secondary | ICD-10-CM | POA: Diagnosis not present

## 2020-12-25 DIAGNOSIS — S81812A Laceration without foreign body, left lower leg, initial encounter: Secondary | ICD-10-CM | POA: Diagnosis present

## 2020-12-25 DIAGNOSIS — A419 Sepsis, unspecified organism: Secondary | ICD-10-CM | POA: Diagnosis present

## 2020-12-25 DIAGNOSIS — Z823 Family history of stroke: Secondary | ICD-10-CM | POA: Diagnosis not present

## 2020-12-25 DIAGNOSIS — A4150 Gram-negative sepsis, unspecified: Secondary | ICD-10-CM | POA: Diagnosis not present

## 2020-12-25 DIAGNOSIS — Z950 Presence of cardiac pacemaker: Secondary | ICD-10-CM | POA: Diagnosis not present

## 2020-12-25 DIAGNOSIS — Z9989 Dependence on other enabling machines and devices: Secondary | ICD-10-CM | POA: Diagnosis not present

## 2020-12-25 DIAGNOSIS — Z96652 Presence of left artificial knee joint: Secondary | ICD-10-CM | POA: Diagnosis present

## 2020-12-25 DIAGNOSIS — Z954 Presence of other heart-valve replacement: Secondary | ICD-10-CM | POA: Diagnosis not present

## 2020-12-28 ENCOUNTER — Ambulatory Visit: Payer: No Typology Code available for payment source | Admitting: Podiatry

## 2021-01-04 ENCOUNTER — Ambulatory Visit (INDEPENDENT_AMBULATORY_CARE_PROVIDER_SITE_OTHER): Payer: Medicare Other | Admitting: Family Medicine

## 2021-01-04 ENCOUNTER — Other Ambulatory Visit: Payer: Self-pay

## 2021-01-04 ENCOUNTER — Other Ambulatory Visit: Payer: Self-pay | Admitting: Cardiology

## 2021-01-04 ENCOUNTER — Encounter: Payer: Self-pay | Admitting: Family Medicine

## 2021-01-04 VITALS — BP 120/68 | HR 83 | Temp 98.4°F | Resp 20 | Ht 72.0 in | Wt 245.0 lb

## 2021-01-04 DIAGNOSIS — I25810 Atherosclerosis of coronary artery bypass graft(s) without angina pectoris: Secondary | ICD-10-CM | POA: Diagnosis not present

## 2021-01-04 DIAGNOSIS — E1165 Type 2 diabetes mellitus with hyperglycemia: Secondary | ICD-10-CM

## 2021-01-04 DIAGNOSIS — T07XXXA Unspecified multiple injuries, initial encounter: Secondary | ICD-10-CM | POA: Diagnosis not present

## 2021-01-04 DIAGNOSIS — IMO0002 Reserved for concepts with insufficient information to code with codable children: Secondary | ICD-10-CM

## 2021-01-04 DIAGNOSIS — C8332 Diffuse large B-cell lymphoma, intrathoracic lymph nodes: Secondary | ICD-10-CM | POA: Diagnosis not present

## 2021-01-04 DIAGNOSIS — L089 Local infection of the skin and subcutaneous tissue, unspecified: Secondary | ICD-10-CM | POA: Diagnosis not present

## 2021-01-04 DIAGNOSIS — E785 Hyperlipidemia, unspecified: Secondary | ICD-10-CM | POA: Diagnosis not present

## 2021-01-04 DIAGNOSIS — A419 Sepsis, unspecified organism: Secondary | ICD-10-CM | POA: Diagnosis not present

## 2021-01-04 DIAGNOSIS — I10 Essential (primary) hypertension: Secondary | ICD-10-CM

## 2021-01-04 DIAGNOSIS — E1151 Type 2 diabetes mellitus with diabetic peripheral angiopathy without gangrene: Secondary | ICD-10-CM

## 2021-01-04 MED ORDER — DOXYCYCLINE HYCLATE 100 MG PO TABS
100.0000 mg | ORAL_TABLET | Freq: Two times a day (BID) | ORAL | 0 refills | Status: DC
Start: 2021-01-04 — End: 2021-02-06

## 2021-01-04 MED ORDER — LEVOFLOXACIN 500 MG PO TABS: 500.0000 mg | ORAL_TABLET | Freq: Every day | ORAL | 0 refills | Status: AC

## 2021-01-04 NOTE — Assessment & Plan Note (Signed)
Referral to wound clinic abx refilled

## 2021-01-04 NOTE — Progress Notes (Signed)
Subjective:   By signing my name below, I, Shehryar Baig, attest that this documentation has been prepared under the direction and in the presence of Dr. Roma Schanz, DO. 01/04/2021     Patient ID: Richard [name], male    DOB: 05/05/1941, 80 y.o.   MRN: [id number]  Chief Complaint  Patient presents with   Hospitalization Follow-up    Sepsis, Pt requesting referral to infectious disease/wound care    HPI Patient is in today for a hospital follow up. He was admitted to the ED on 12/24/2020 for sepsis without septic shock. He notes 5 days ago he woke up feeling cold aching all over his body. He then went to the urgent care and tested negative for Covid-19 and was told to go to the ED. He was confused while driving to the ED and lost consciousness after getting into the wrong car. He was found with a fever of 106 degrees and taken to the ED from ems services. While in the hospital he was taking antibiotics to manage his symptoms. His ED doctors could not determine the source of his infection. They recommend that his primary care physician refer him to a wound care specialist.  He reports that his left leg swelling has reduced from yesterday but is still currently swollen. His left arm is currently swollen. He continues seeing his cardiologist to manage his vascular issues.  He reports that his a1c levels have improved since his last visit. Lab Results  Component Value Date   HGBA1C 6.3 (H) 11/23/2020   His nephrologists reduced the amount of fluid medications he was taking and he reports doing well.  Past Medical History:  Diagnosis Date   Anemia    Arthritis    hips   Axillary adenopathy 02/25/2017   Bradycardia    a. holter monitor has demonstrated HRs in 30s and [place]    CAD (coronary artery disease)    a. s/p CABG 2001  b.  07/28/2017 cath:   Severe three-vessel native CAD with total occlusion of LAD, ramus intermedius, first OM and RCA, patent RIMA to PDA, LIMA to LAD,  sequential SVG to ramus intermedius and first OM.     Chronic lower back pain    Diffuse large B cell lymphoma (HCC)    Diverticulosis    Esophageal stricture    GERD (gastroesophageal reflux disease)    History of gout    HTN (hypertension)    Mixed hyperlipidemia    OSA on CPAP    with 2L O2 at night   Pancytopenia ([place])    a. related to chemo therapy for B cell lymphoma   Peptic stricture of esophagus    Presence of permanent cardiac pacemaker    sees Dr. Valaria Good pacemaker   Severe aortic stenosis    Spinal stenosis    Squamous cell carcinoma of skin 03/22/2009   Right mandible. SCCis, hypertrophic.    Type II diabetes mellitus ([place])    Wears dentures    partial upper    Past Surgical History:  Procedure Laterality Date   APPENDECTOMY  ~ [place] Left 11/06/2016   Procedure: CATARACT EXTRACTION PHACO AND INTRAOCULAR LENS PLACEMENT (IOC);  Surgeon: Estill Cotta, MD;  Location: [facility];  Service: Ophthalmology;  Laterality: Left;  Lot # [id number] H [place]: 01:09.4 AP%:25.2 CDE: 30.64   CATARACT EXTRACTION W/PHACO Right 12/04/2016   Procedure: CATARACT EXTRACTION PHACO AND INTRAOCULAR LENS PLACEMENT (IOC);  Surgeon: Estill Cotta, MD;  Location: [facility];  Service: Ophthalmology;  Laterality: Right;  [place] 1:25.9 AP% 24.1 CDE 39.10 Fluid Pack lot # [id number]   COLONOSCOPY W/ BIOPSIES AND POLYPECTOMY  2013   CORONARY ANGIOPLASTY  1993   CORONARY ANGIOPLASTY WITH STENT PLACEMENT  05/1997   "1"   CORONARY ARTERY BYPASS GRAFT  03/2000   "CABG X5"   ECTROPION REPAIR Right 09/01/2018   Procedure: REPAIR OF ECTROPION BILATERAL upper and lower;  Surgeon: Karle Starch, MD;  Location: [place];  Service: Ophthalmology;  Laterality: Right;  Diabetic - oral meds sleep apnea   ESOPHAGEAL DILATION  X 3-4   Dr. Lyla Son; "last one was in the 1990's"   ESOPHAGOGASTRODUODENOSCOPY     multiple   FLEXIBLE SIGMOIDOSCOPY      multiple   HYDRADENITIS EXCISION Left 02/25/2017   Procedure: EXCISION DEEP LEFT AXILLARY LYMPH NODE;  Surgeon: Fanny Skates, MD;  Location: [place];  Service: General;  Laterality: Left;   INTRAOPERATIVE TRANSTHORACIC ECHOCARDIOGRAM N/A 09/09/2017   Procedure: INTRAOPERATIVE TRANSTHORACIC ECHOCARDIOGRAM;  Surgeon: Burnell Blanks, MD;  Location: [place];  Service: Open Heart Surgery;  Laterality: N/A;   KNEE ARTHROSCOPY Left 2011   meniscus repair   LEFT HEART CATHETERIZATION WITH CORONARY/GRAFT ANGIOGRAM N/A 03/16/2014   Procedure: LEFT HEART CATHETERIZATION WITH Beatrix Fetters;  Surgeon: Burnell Blanks, MD;  Location: [facility] CATH LAB;  Service: Cardiovascular;  Laterality: N/A;   LUMBAR LAMINECTOMY/DECOMPRESSION MICRODISCECTOMY Right 06/17/2013   Procedure: LUMBAR LAMINECTOMY MICRODISCECTOMY L4-L5 RIGHT EXCISION OF SYNOVIAL CYST RIGHT   (1 LEVEL) RIGHT PARTIAL FACETECTOMY;  Surgeon: Tobi Bastos, MD;  Location: [facility];  Service: Orthopedics;  Laterality: Right;   MYELOGRAM  04/06/13   lumbar, Dr Gladstone Lighter   ORBITAL LESION EXCISION Right 09/01/2018   Procedure: ORBITOTOMY WITHOUT BONE FLAP WITH REMOVAL OF LESION RIGHT;  Surgeon: Karle Starch, MD;  Location: [place];  Service: Ophthalmology;  Laterality: Right;   PACEMAKER IMPLANT N/A 09/10/2017   Procedure: PACEMAKER IMPLANT;  Surgeon: Deboraha Sprang, MD;  Location: [place] CV LAB;  Service: Cardiovascular;  Laterality: N/A;   PANENDOSCOPY     PORTACATH PLACEMENT N/A 03/06/2017   Procedure: INSERTION PORT-A-CATH AND ASPIRATE SEROMA LEFT AXILLA;  Surgeon: Fanny Skates, MD;  Location: [place];  Service: General;  Laterality: N/A;   RIGHT/LEFT HEART CATH AND CORONARY/GRAFT ANGIOGRAPHY N/A 07/28/2017   Procedure: RIGHT/LEFT HEART CATH AND CORONARY/GRAFT ANGIOGRAPHY;  Surgeon: Sherren Mocha, MD;  Location: [place] CV LAB;  Service: Cardiovascular;  Laterality: N/A;   SHOULDER SURGERY Right 08/2010   screws  placed; "tendons tore off"   SKIN CANCER EXCISION Right    "neck"   TONSILLECTOMY  ~ [place] Left 06/29/2019   Procedure: TOTAL KNEE ARTHROPLASTY;  Surgeon: Paralee Cancel, MD;  Location: [facility];  Service: Orthopedics;  Laterality: Left;  70 mins   TRANSCATHETER AORTIC VALVE REPLACEMENT, TRANSFEMORAL N/A 09/09/2017   Procedure: TRANSCATHETER AORTIC VALVE REPLACEMENT, TRANSFEMORAL;  Surgeon: Burnell Blanks, MD;  Location: [place];  Service: Open Heart Surgery;  Laterality: N/A;   UPPER GI ENDOSCOPY  2013   Gastritis; Dr Carlean Purl   VASECTOMY      Family History  Problem Relation Age of Onset   Stroke Father    Hypertension Father    Pancreatic cancer Mother    Diabetes Maternal Grandmother    Stroke Maternal Grandmother    Heart attack Paternal Grandmother    Colon  cancer Neg Hx    Esophageal cancer Neg Hx    Rectal cancer Neg Hx    Stomach cancer Neg Hx    Ulcers Neg Hx     Social History   Socioeconomic History   Marital status: Divorced    Spouse name: Not on file   Number of children: 2   Years of education: college   Highest education level: Not on file  Occupational History    Employer: RETIRED  Tobacco Use   Smoking status: Never   Smokeless tobacco: Never  Vaping Use   Vaping Use: Never used  Substance and Sexual Activity   Alcohol use: No    Alcohol/week: 0.0 standard drinks    Comment: "last drink was in 2012"( 03/15/2014)   Drug use: No   Sexual activity: Not Currently  Other Topics Concern   Not on file  Social History Narrative   ** Merged History Encounter **       Divorced, lives with a roommate. 1 son one daughter 3-4 caffeinated beverages daily Right-handed. 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   Social Determinants of Health   Financial Resource Strain: Low Risk    Difficulty of Paying  Living Expenses: Not hard at all  Food Insecurity: Not on file  Transportation Needs: Not on file  Physical Activity: Not on file  Stress: Not on file  Social Connections: Not on file  Intimate Partner Violence: Not on file    Outpatient Medications Prior to Visit  Medication Sig Dispense Refill   Acetaminophen (TYLENOL) 325 MG CAPS Tylenol     allopurinol (ZYLOPRIM) 300 MG tablet Take 450 mg by mouth daily.     aluminum hydroxide-magnesium carbonate (GAVISCON) 95-358 MG/15ML SUSP Take 15 mLs by mouth as needed for indigestion or heartburn.     amLODipine (NORVASC) 5 MG tablet TAKE 1 TABLET BY MOUTH TWICE A DAY 180 tablet 3   apixaban (ELIQUIS) 2.5 MG TABS tablet Take 1 tablet (2.5 mg total) by mouth 2 (two) times daily. 180 tablet 1   atorvastatin (LIPITOR) 80 MG tablet TAKE ONE TABLET BY MOUTH AT BEDTIME 90 tablet 3   azelastine (ASTELIN) 0.1 % nasal spray Place 2 sprays into both nostrils at bedtime as needed for rhinitis or allergies. 30 mL 12   esomeprazole (NEXIUM) 40 MG capsule Take 1 capsule (40 mg total) by mouth daily. 30 capsule 5   ezetimibe (ZETIA) 10 MG tablet TAKE 1 TABLET BY MOUTH EVERY DAY 90 tablet 3   fenofibrate 160 MG tablet Take 1 tablet (160 mg total) by mouth daily. 90 tablet 0   folic acid (FOLVITE) 1 MG tablet TAKE 2 TABLETS BY MOUTH EVERY DAY 180 tablet 4   FREESTYLE LITE test strip USE TO TEST BLOOD SUGAR ONCE A DAY. DX CODE: E11.9 100 strip 12   glimepiride (AMARYL) 1 MG tablet TAKE 1 TABLET BY MOUTH DAILY WITH BREAKFAST. 90 tablet 1   Lancets (FREESTYLE) lancets USE TWICE A DAY TO CHECK BLOOD SUGAR. DX E11.9 100 each 6   metFORMIN (GLUCOPHAGE) 1000 MG tablet TAKE 1 & 1/2 TABLETS BY MOUTH EVERY MORNING AND 1 TABLET IN THE EVENING. 225 tablet 1   nitroGLYCERIN (NITROSTAT) 0.4 MG SL tablet PLACE 1 TABLET (0.4 MG TOTAL) UNDER THE TONGUE EVERY 5 (FIVE) MINUTES AS NEEDED FOR CHEST PAIN. 25 tablet 6   polycarbophil (  FIBERCON) 625 MG tablet Take 625 mg by mouth daily.      potassium chloride SA (KLOR-CON M20) 20 MEQ tablet Take 2 tablets (40 mEq total) by mouth 2 (two) times daily. 360 tablet 3   pregabalin (LYRICA) 150 MG capsule TAKE 1 CAPSULE BY MOUTH TWICE A DAY 180 capsule 1   psyllium (METAMUCIL) 58.6 % powder Take 1 packet by mouth daily as needed (constipation).     torsemide (DEMADEX) 20 MG tablet Take 20 mg by mouth daily.     VASCEPA 1 g capsule TAKE 2 CAPSULES BY MOUTH TWICE A DAY 120 capsule 5   metoprolol tartrate (LOPRESSOR) 25 MG tablet TAKE ONE-HALF TABLET BY MOUTH TWICE DAILY 90 tablet 3   Facility-Administered Medications Prior to Visit  Medication Dose Route Frequency Provider Last Rate Last Admin   sodium chloride flush (NS) 0.9 % injection 10 mL  10 mL Intravenous PRN [name], MD   10 mL at 05/30/17 0920    Allergies  Allergen Reactions   Benazepril Swelling    angioedema; he is not a candidate for any angiotensin receptor blockers because of this significant allergic reaction. Because of a history of documented adverse serious drug reaction;Medi Alert bracelet  is recommended   Hctz [Hydrochlorothiazide] Anaphylaxis and Swelling    Tongue and lip swelling    Aspirin Other (See Comments)    Gastritis, cant take 325 Mg aspirin    Lactose Intolerance (Gi) Nausea And Vomiting    Review of Systems  Constitutional:  Negative for chills, fever and malaise/fatigue.  HENT:  Negative for congestion and hearing loss.   Eyes:  Negative for discharge.  Respiratory:  Negative for cough, sputum production and shortness of breath.   Cardiovascular:  Positive for leg swelling (Left leg). Negative for chest pain and palpitations.       (+)Left arm swelling  Gastrointestinal:  Negative for abdominal pain, blood in stool, constipation, diarrhea, heartburn, nausea and vomiting.  Genitourinary:  Negative for dysuria, frequency, hematuria and urgency.  Musculoskeletal:  Negative for back pain, falls and myalgias.  Skin:  Negative for  rash.       Mult wounds arms and legs ----  dressed with bandages  + surrounding errythema   Neurological:  Negative for dizziness, sensory change, loss of consciousness, weakness and headaches.  Endo/Heme/Allergies:  Negative for environmental allergies. Does not bruise/bleed easily.  Psychiatric/Behavioral:  Negative for depression and suicidal ideas. The patient is not nervous/anxious and does not have insomnia.       Objective:    Physical Exam Vitals and nursing note reviewed.  Constitutional:      General: He is not in acute distress.    Appearance: Normal appearance. He is not ill-appearing.  HENT:     Head: Normocephalic and atraumatic.     Right Ear: External ear normal.     Left Ear: External ear normal.  Eyes:     Extraocular Movements: Extraocular movements intact.     Pupils: Pupils are equal, round, and reactive to light.  Cardiovascular:     Rate and Rhythm: Normal rate and regular rhythm.     Pulses: Normal pulses.     Heart sounds: Normal heart sounds. No murmur heard.   No gallop.  Pulmonary:     Effort: Pulmonary effort is normal. No respiratory distress.     Breath sounds: Normal breath sounds. No wheezing, rhonchi or rales.  Skin:    General: Skin is warm and dry.  Neurological:  Mental Status: He is alert and oriented to person, place, and time.  Psychiatric:        Behavior: Behavior normal.    BP 120/68 (BP Location: Right Arm, Patient Position: Sitting, Cuff Size: Large)   Pulse 83   Temp 98.4 F (36.9 C) (Oral)   Resp 20   Ht 6' (1.829 m)   Wt 245 lb (111.1 kg)   SpO2 97%   BMI 33.23 kg/m  Wt Readings from Last 3 Encounters:  01/04/21 245 lb (111.1 kg)  12/24/20 243 lb (110.2 kg)  12/21/20 245 lb (111.1 kg)    Diabetic Foot Exam - Simple   No data filed    Lab Results  Component Value Date   WBC 9.0 12/20/2020   HGB 13.4 12/20/2020   HCT 41.9 12/20/2020   PLT 212 12/20/2020   GLUCOSE 117 (H) 12/20/2020   CHOL 162 11/23/2020    TRIG 206 (H) 11/23/2020   HDL 38 (L) 11/23/2020   LDLDIRECT 47.0 07/27/2020   LDLCALC 89 11/23/2020   ALT 18 12/20/2020   AST 16 12/20/2020   NA 138 12/20/2020   K 4.9 12/20/2020   CL 102 12/20/2020   CREATININE 1.51 (H) 12/20/2020   BUN 36 (H) 12/20/2020   CO2 27 12/20/2020   TSH 2.450 07/27/2018   PSA 1.11 11/22/2019   INR 1.15 11/15/2017   HGBA1C 6.3 (H) 11/23/2020   MICROALBUR <0.7 07/27/2020    Lab Results  Component Value Date   TSH 2.450 07/27/2018   Lab Results  Component Value Date   WBC 9.0 12/20/2020   HGB 13.4 12/20/2020   HCT 41.9 12/20/2020   MCV 91.3 12/20/2020   PLT 212 12/20/2020   Lab Results  Component Value Date   NA 138 12/20/2020   K 4.9 12/20/2020   CO2 27 12/20/2020   GLUCOSE 117 (H) 12/20/2020   BUN 36 (H) 12/20/2020   CREATININE 1.51 (H) 12/20/2020   BILITOT 0.5 12/20/2020   ALKPHOS 57 12/20/2020   AST 16 12/20/2020   ALT 18 12/20/2020   PROT 7.2 12/20/2020   ALBUMIN 4.9 12/20/2020   CALCIUM 11.1 (H) 12/20/2020   ANIONGAP 9 12/20/2020   GFR 32.35 (L) 07/27/2020   Lab Results  Component Value Date   CHOL 162 11/23/2020   Lab Results  Component Value Date   HDL 38 (L) 11/23/2020   Lab Results  Component Value Date   LDLCALC 89 11/23/2020   Lab Results  Component Value Date   TRIG 206 (H) 11/23/2020   Lab Results  Component Value Date   CHOLHDL 4.3 11/23/2020   Lab Results  Component Value Date   HGBA1C 6.3 (H) 11/23/2020       Assessment & Plan:   Problem List Items Addressed This Visit       Unprioritized   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 ([place])    Per [place]       Relevant Medications   levofloxacin (LEVAQUIN) 500 MG tablet   doxycycline (VIBRA-TABS) 100 MG tablet   DM (diabetes mellitus) type II uncontrolled, periph vascular disorder (HCC)    Lab Results  Component Value Date   HGBA1C 6.3 (H) 11/23/2020  con't meds  hgba1c acceptable, minimize simple carbs. Increase  exercise as tolerated. Continue current meds        Essential hypertension    Well controlled, no changes to meds. Encouraged heart healthy diet such as the DASH diet and exercise as tolerated.  Hyperlipidemia LDL goal <70    Lab Results  Component Value Date   CHOL 162 11/23/2020   HDL 38 (L) 11/23/2020   LDLCALC 89 11/23/2020   LDLDIRECT 47.0 07/27/2020   TRIG 206 (H) 11/23/2020   CHOLHDL 4.3 11/23/2020  per cardiology        Multiple superficial wounds with infection - Primary    Referral to wound clinic abx refilled        Relevant Medications   levofloxacin (LEVAQUIN) 500 MG tablet   doxycycline (VIBRA-TABS) 100 MG tablet   Other Relevant Orders   Ambulatory referral to Wound Clinic   Sepsis [facility])    Unknown etiology Wound clinic referral       Relevant Medications   levofloxacin (LEVAQUIN) 500 MG tablet   doxycycline (VIBRA-TABS) 100 MG tablet     Meds ordered this encounter  Medications   levofloxacin (LEVAQUIN) 500 MG tablet    Sig: Take 1 tablet (500 mg total) by mouth daily for 7 days.    Dispense:  7 tablet    Refill:  0   doxycycline (VIBRA-TABS) 100 MG tablet    Sig: Take 1 tablet (100 mg total) by mouth 2 (two) times daily.    Dispense:  14 tablet    Refill:  0    I, Dr. Roma Schanz, DO, personally preformed the services described in this documentation.  All medical record entries made by the scribe were at my direction and in my presence.  I have reviewed the chart and discharge instructions (if applicable) and agree that the record reflects my personal performance and is accurate and complete. 01/04/2021   I,Shehryar Baig,acting as a scribe for [name], DO.,have documented all relevant documentation on the behalf of [name], DO,as directed by  [name], DO while in the presence of [name], DO.   [name], DO

## 2021-01-04 NOTE — Assessment & Plan Note (Signed)
Unknown etiology Wound clinic referral

## 2021-01-04 NOTE — Assessment & Plan Note (Signed)
Per [place]

## 2021-01-04 NOTE — Patient Instructions (Signed)
Wound Infection A wound infection happens when tiny organisms (microorganisms) start to grow in a wound. A wound infection is most often caused by bacteria. Infection can cause the wound to break open or worsen. Wound infection needs treatment. If a wound infection is left untreated, complications can occur. Untreated wound infections may lead to an infection in the bloodstream (septicemia) or a bone infection (osteomyelitis). What are the causes? This condition is most often caused by bacteria growing in a wound. Othermicroorganisms, like yeast and fungi, can also cause wound infections. What increases the risk? The following factors may make you more likely to develop this condition: Having a weak body defense system (immune system). Having diabetes. Taking steroid medicines for a long time (chronic use). Smoking. Being an older person. Being overweight. Taking chemotherapy medicines. What are the signs or symptoms? Symptoms of this condition include: Having more redness, swelling, or pain at the wound site. Having more blood or fluid at the wound site. A bad smell coming from a wound or bandage (dressing). Having a fever. Feeling tired or fatigued. Having warmth at or around the wound. Having pus at the wound site. How is this diagnosed? This condition is diagnosed with a medical history and physical exam. You mayalso have a wound culture or blood tests or both. How is this treated? This condition is usually treated with an antibiotic medicine. The infection should improve 24-48 hours after you start antibiotics. After 24-48 hours, redness around the wound should stop spreading, and the wound should be less painful. Follow these instructions at home: Medicines Take or apply over-the-counter and prescription medicines only as told by your health care provider. If you were prescribed an antibiotic medicine, take or apply it as told by your health care provider. Do not stop using the  antibiotic even if you start to feel better. Wound care  Clean the wound each day, or as told by your health care provider. Wash the wound with mild soap and water. Rinse the wound with water to remove all soap. Pat the wound dry with a clean towel. Do not rub it. Follow instructions from your health care provider about how to take care of your wound. Make sure you: Wash your hands with soap and water before and after you change your dressing. If soap and water are not available, use hand sanitizer. Change your dressing as told by your health care provider. Leave stitches (sutures), skin glue, or adhesive strips in place if your wound has been closed. These skin closures may need to stay in place for 2 weeks or longer. If adhesive strip edges start to loosen and curl up, you may trim the loose edges. Do not remove adhesive strips completely unless your health care provider tells you to do that. Some wounds are left open to heal on their own. Check your wound every day for signs of infection. Watch for: More redness, swelling, or pain. More fluid or blood. Warmth. Pus or a bad smell.  General instructions Keep the dressing dry until your health care provider says it can be removed. Do not take baths, swim, or use a hot tub until your health care provider approves. Ask your health care provider if you may take showers. You may only be allowed to take sponge baths. Raise (elevate) the injured area above the level of your heart while you are sitting or lying down. Do not scratch or pick at the wound. Keep all follow-up visits as told by your health care provider.  This is important. Contact a health care provider if: Your pain is not controlled with medicine. You have more redness, swelling, or pain around your wound. You have more fluid or blood coming from your wound. Your wound feels warm to the touch. You have pus coming from your wound. You continue to notice a bad smell coming from your  wound or your dressing. Your wound that was closed breaks open. Get help right away if: You have a red streak going away from your wound. You have a fever. Summary A wound infection happens when tiny organisms (microorganisms) start to grow in a wound. This condition is usually treated with an antibiotic medicine. Follow instructions from your health care provider about how to take care of your wound. Contact a health care provider if your wound infection does not begin to improve in 24-48 hours, or your symptoms worsen. Keep all follow-up visits as told by your health care provider. This is important. This information is not intended to replace advice given to you by your health care provider. Make sure you discuss any questions you have with your healthcare provider. Document Revised: 01/27/2018 Document Reviewed: 01/27/2018 Elsevier Patient Education  [place].

## 2021-01-04 NOTE — Assessment & Plan Note (Signed)
Lab Results  Component Value Date   HGBA1C 6.3 (H) 11/23/2020   con't meds  hgba1c acceptable, minimize simple carbs. Increase exercise as tolerated. Continue current meds

## 2021-01-04 NOTE — Assessment & Plan Note (Signed)
Lab Results  Component Value Date   CHOL 162 11/23/2020   HDL 38 (L) 11/23/2020   LDLCALC 89 11/23/2020   LDLDIRECT 47.0 07/27/2020   TRIG 206 (H) 11/23/2020   CHOLHDL 4.3 11/23/2020  per cardiology

## 2021-01-04 NOTE — Assessment & Plan Note (Signed)
Well controlled, no changes to meds. Encouraged heart healthy diet such as the DASH diet and exercise as tolerated.  °

## 2021-01-09 ENCOUNTER — Other Ambulatory Visit: Payer: Self-pay | Admitting: Student

## 2021-01-09 NOTE — Telephone Encounter (Signed)
Pt last saw Dr Percival Spanish 11/21/20, last labs 12/26/20 Creat 1.35, age 80, weight 111.1kg, based on most recent serum creat pt is not on appropriate dosage of Eliquis however pt's creat fluctuates and has been >1.5 as recent as 12/20/20.  Discussed with Marcelle Overlie, pharmacist requested message be sent to Dr Percival Spanish to inquire if he thinks dose change is appropriate, or if he would like pt to continue on Eliquis 2.5mg  BID.  Staff msg sent to Dr Percival Spanish, will await response.

## 2021-01-10 ENCOUNTER — Other Ambulatory Visit: Payer: Self-pay | Admitting: Cardiology

## 2021-01-11 ENCOUNTER — Encounter: Payer: Self-pay | Admitting: Family

## 2021-01-12 ENCOUNTER — Other Ambulatory Visit: Payer: Self-pay

## 2021-01-12 ENCOUNTER — Encounter (HOSPITAL_BASED_OUTPATIENT_CLINIC_OR_DEPARTMENT_OTHER): Payer: Medicare Other | Admitting: Internal Medicine

## 2021-01-12 DIAGNOSIS — S81801A Unspecified open wound, right lower leg, initial encounter: Secondary | ICD-10-CM | POA: Diagnosis not present

## 2021-01-12 DIAGNOSIS — S81802A Unspecified open wound, left lower leg, initial encounter: Secondary | ICD-10-CM | POA: Diagnosis not present

## 2021-01-12 DIAGNOSIS — C8338 Diffuse large B-cell lymphoma, lymph nodes of multiple sites: Secondary | ICD-10-CM | POA: Diagnosis not present

## 2021-01-12 DIAGNOSIS — I2510 Atherosclerotic heart disease of native coronary artery without angina pectoris: Secondary | ICD-10-CM | POA: Insufficient documentation

## 2021-01-12 DIAGNOSIS — D500 Iron deficiency anemia secondary to blood loss (chronic): Secondary | ICD-10-CM | POA: Diagnosis not present

## 2021-01-12 DIAGNOSIS — X58XXXA Exposure to other specified factors, initial encounter: Secondary | ICD-10-CM | POA: Insufficient documentation

## 2021-01-12 DIAGNOSIS — Z8572 Personal history of non-Hodgkin lymphomas: Secondary | ICD-10-CM | POA: Insufficient documentation

## 2021-01-12 DIAGNOSIS — M7989 Other specified soft tissue disorders: Secondary | ICD-10-CM | POA: Diagnosis not present

## 2021-01-12 DIAGNOSIS — E1140 Type 2 diabetes mellitus with diabetic neuropathy, unspecified: Secondary | ICD-10-CM | POA: Insufficient documentation

## 2021-01-12 DIAGNOSIS — S40812A Abrasion of left upper arm, initial encounter: Secondary | ICD-10-CM | POA: Diagnosis not present

## 2021-01-12 DIAGNOSIS — G620 Drug-induced polyneuropathy: Secondary | ICD-10-CM | POA: Diagnosis not present

## 2021-01-12 DIAGNOSIS — D510 Vitamin B12 deficiency anemia due to intrinsic factor deficiency: Secondary | ICD-10-CM | POA: Diagnosis not present

## 2021-01-12 NOTE — Progress Notes (Addendum)
KORREY, SCHLEICHER ([id number]) Visit Report for 01/12/2021 Chief Complaint Document Details Patient Name: Date of Service: Richard [name] [name] 01/12/2021 7:30 A M Medical Record Number: [id number] Patient Account Number: [id number] Date of Birth/Sex: Treating RN: 08-02-1940 (80 y.o. [name] Primary Care Provider: Roma Schanz Other Clinician: Referring Provider: Treating Provider/Extender: Tharon Aquas in Treatment: 0 Information Obtained from: Patient Chief Complaint Wounds to his left upper extremity and bilateral lower extremities. Electronic Signature(s) Signed: 01/12/2021 1:07:30 PM By: Kalman Shan DO Entered By: Kalman Shan on 01/12/2021 12:50:48 -------------------------------------------------------------------------------- Debridement Details Patient Name: Date of Service: [name], [name] 01/12/2021 7:30 A M Medical Record Number: [id number] Patient Account Number: [id number] Date of Birth/Sex: Treating RN: 1941/05/17 (80 y.o. [name] Primary Care Provider: Roma Schanz Other Clinician: Referring Provider: Treating Provider/Extender: Tharon Aquas in Treatment: 0 Debridement Performed for Assessment: Wound #2 Right,Posterior Lower Leg Performed By: Physician Kalman Shan, DO Debridement Type: Debridement Level of Consciousness (Pre-procedure): Awake and Alert Pre-procedure Verification/Time Out Yes - 09:10 Taken: Start Time: 09:10 Pain Control: Other : Benzocaine 20% T Area Debrided (L x W): otal 5 (cm) x 3.5 (cm) = 17.5 (cm) Tissue and other material debrided: Viable, Non-Viable, Slough, Subcutaneous, Skin: Dermis , Slough Level: Skin/Subcutaneous Tissue Debridement Description: Excisional Instrument: Curette Bleeding: Minimum Hemostasis Achieved: Pressure End Time: 09:17 Procedural Pain: 0 Post Procedural Pain: 0 Response to Treatment: Procedure was  tolerated well Level of Consciousness (Post- Awake and Alert procedure): Post Debridement Measurements of Total Wound Length: (cm) 5 Width: (cm) 3.5 Depth: (cm) 0.1 Volume: (cm) 1.374 Character of Wound/Ulcer Post Debridement: Requires Further Debridement Post Procedure Diagnosis Same as Pre-procedure Electronic Signature(s) Signed: 01/12/2021 12:39:56 PM By: Baruch Gouty RN, BSN Signed: 01/12/2021 1:07:30 PM By: Kalman Shan DO Entered By: Baruch Gouty on 01/12/2021 09:16:26 -------------------------------------------------------------------------------- Debridement Details Patient Name: Date of Service: [name], [name] 01/12/2021 7:30 A M Medical Record Number: [id number] Patient Account Number: [id number] Date of Birth/Sex: Treating RN: 01/01/1941 (79 y.o. [name] Primary Care Provider: Roma Schanz Other Clinician: Referring Provider: Treating Provider/Extender: Tharon Aquas in Treatment: 0 Debridement Performed for Assessment: Wound #1 Left,Lateral Lower Leg Performed By: Physician Kalman Shan, DO Debridement Type: Debridement Level of Consciousness (Pre-procedure): Awake and Alert Pre-procedure Verification/Time Out Yes - 09:10 Taken: Start Time: 09:10 Pain Control: Other : Benzocaine 20% T Area Debrided (L x W): otal 3.5 (cm) x 1.5 (cm) = 5.25 (cm) Tissue and other material debrided: Viable, Non-Viable, Slough, Subcutaneous, Slough Level: Skin/Subcutaneous Tissue Debridement Description: Excisional Instrument: Curette Bleeding: Minimum Hemostasis Achieved: Pressure End Time: 09:17 Procedural Pain: 0 Post Procedural Pain: 0 Response to Treatment: Procedure was tolerated well Level of Consciousness (Post- Awake and Alert procedure): Post Debridement Measurements of Total Wound Length: (cm) 9 Width: (cm) 6 Depth: (cm) 0.1 Volume: (cm) 4.241 Character of Wound/Ulcer Post Debridement: Requires  Further Debridement Post Procedure Diagnosis Same as Pre-procedure Electronic Signature(s) Signed: 01/12/2021 12:39:56 PM By: Baruch Gouty RN, BSN Signed: 01/12/2021 1:07:30 PM By: Kalman Shan DO Entered By: Baruch Gouty on 01/12/2021 09:17:23 -------------------------------------------------------------------------------- HPI Details Patient Name: Date of Service: [name], [name] 01/12/2021 7:30 A M Medical Record Number: [id number] Patient Account Number: [id number] Date of Birth/Sex: Treating RN: 1940/07/05 (80 y.o. [name] Primary Care Provider: Roma Schanz Other Clinician: Referring Provider: Treating Provider/Extender: Tharon Aquas in Treatment: 0 History of Present Illness HPI Description: Admission 7/15 Mr.  Zorian Edmonston is an 80 year old male with a past medical history of type 2 diabetes on oral agents, diffuse large B-cell lymphoma, and coronary artery disease status post CABG that presents to the clinic for wounds to his left upper extremity and bilateral lower extremities. He states that he went to urgent care to be evaluated for a fever. And he was instructed to go to the ED to be further evaluated for possible sepsis. Unfortunately he passed out in his car After his appointment and was not discovered for another 3 hours. He was eventually discovered and EMS had to take him out of his vehicle. This is when he developed abrasions to his left arm and legs. He has been using Xeroform to his wound beds every other day. He reports improvement to all the wounds except for the one on the back of his right leg. He denies pain or signs of infection. Electronic Signature(s) Signed: 01/12/2021 1:07:30 PM By: Kalman Shan DO Entered By: Kalman Shan on 01/12/2021 12:59:34 -------------------------------------------------------------------------------- Physical Exam Details Patient Name: Date of Service: [name], [name]  [name] 01/12/2021 7:30 A M Medical Record Number: [id number] Patient Account Number: [id number] Date of Birth/Sex: Treating RN: [birth year] (80 y.o. [name] Primary Care Provider: Roma Schanz Other Clinician: Referring Provider: Treating Provider/Extender: Tharon Aquas in Treatment: 0 Constitutional respirations regular, non-labored and within target range for patient.. Cardiovascular 2+ dorsalis pedis/posterior tibialis pulses. Psychiatric pleasant and cooperative. Notes Left upper extremity: Wound limited to skin breakdown. This appears almost closed. Right lower extremity: T the posterior aspect there is an open wound with granulation tissue and nonviable tissue present. T the upper anterior portion there is o o a small wound limited to skin breakdown Left lower extremity: T the anterior aspect there is a wound limited to skin breakdown but with some nonviable tissue present. o No signs of infection to all wounds. 1+ pitting edema to lower extremities bilaterally Electronic Signature(s) Signed: 01/12/2021 1:07:30 PM By: Kalman Shan DO Entered By: Kalman Shan on 01/12/2021 13:03:08 -------------------------------------------------------------------------------- Physician Orders Details Patient Name: Date of Service: [name], [name] 01/12/2021 7:30 A M Medical Record Number: [id number] Patient Account Number: [id number] Date of Birth/Sex: Treating RN: 1940/09/04 (80 y.o. [name] Primary Care Provider: Roma Schanz Other Clinician: Referring Provider: Treating Provider/Extender: Tharon Aquas in Treatment: 0 Verbal / Phone Orders: No Diagnosis Coding ICD-10 Coding Code Description S81.801A Unspecified open wound, right lower leg, initial encounter S81.802A Unspecified open wound, left lower leg, initial encounter S40.812A Abrasion of left upper arm, initial  encounter Follow-up Appointments Return Appointment in 1 week. Bathing/ Shower/ Hygiene May shower with protection but do not get wound dressing(s) wet. Edema Control - Lymphedema / SCD / Other Bilateral Lower Extremities Elevate legs to the level of the heart or above for 30 minutes daily and/or when sitting, a frequency of: - throughout the day Avoid standing for long periods of time. Exercise regularly Wound Treatment Wound #1 - Lower Leg Wound Laterality: Left, Lateral Prim Dressing: KerraCel Ag Gelling Fiber Dressing, 2x2 in (silver alginate) (DME) (Dispense As Written) Every Other Day/30 Days ary Discharge Instructions: Apply silver alginate to wound bed as instructed Secondary Dressing: Zetuvit Plus Silicone Border Dressing 5x5 (in/in) (DME) (Generic) Every Other Day/30 Days Discharge Instructions: Apply silicone border over primary dressing as directed. Wound #2 - Lower Leg Wound Laterality: Right, Posterior Peri-Wound Care: Sween Lotion (Moisturizing lotion) 1 x Per Week/15 Days Discharge Instructions: Apply moisturizing  lotion as directed Prim Dressing: Hydrofera Blue Ready Foam, 2.5 x2.5 in 1 x Per Week/15 Days ary Discharge Instructions: Apply to wound bed as instructed Prim Dressing: Santyl Ointment 1 x Per Week/15 Days ary Discharge Instructions: Apply nickel thick amount to wound bed as instructed Secondary Dressing: ABD Pad, 5x9 1 x Per Week/15 Days Discharge Instructions: Apply over primary dressing as directed. Compression Wrap: Kerlix Roll 4.5x3.1 (in/yd) 1 x Per Week/15 Days Discharge Instructions: Apply Kerlix and Coban compression as directed. Compression Wrap: Coban Self-Adherent Wrap 4x5 (in/yd) 1 x Per Week/15 Days Discharge Instructions: Apply over Kerlix as directed. Wound #3 - Lower Leg Wound Laterality: Right, Lateral Peri-Wound Care: Sween Lotion (Moisturizing lotion) Discharge Instructions: Apply moisturizing lotion as directed Prim Dressing: KerraCel  Ag Gelling Fiber Dressing, 2x2 in (silver alginate) ary Discharge Instructions: Apply silver alginate to wound bed as instructed Secondary Dressing: Woven Gauze Sponge, Non-Sterile 4x4 in Discharge Instructions: Apply over primary dressing as directed. Compression Wrap: Kerlix Roll 4.5x3.1 (in/yd) Discharge Instructions: Apply Kerlix and Coban compression as directed. Compression Wrap: Coban Self-Adherent Wrap 4x5 (in/yd) Discharge Instructions: Apply over Kerlix as directed. Wound #4 - Forearm Wound Laterality: Left, Lateral Prim Dressing: Xeroform Occlusive Gauze Dressing, 4x4 in Every Other Day/15 Days ary Discharge Instructions: Apply to wound bed as instructed Secondary Dressing: Zetuvit Plus Silicone Border Dressing 4x4 (in/in) Every Other Day/15 Days Discharge Instructions: Apply silicone border over primary dressing as directed. Electronic Signature(s) Signed: 01/12/2021 1:07:30 PM By: Kalman Shan DO Signed: 01/12/2021 1:07:30 PM By: Kalman Shan DO Previous Signature: 01/12/2021 12:39:56 PM Version By: Baruch Gouty RN, BSN Entered By: Kalman Shan on 01/12/2021 13:03:39 -------------------------------------------------------------------------------- Problem List Details Patient Name: Date of Service: [name], [name] 01/12/2021 7:30 A M Medical Record Number: [id number] Patient Account Number: [id number] Date of Birth/Sex: Treating RN: [birth year] (80 y.o. [name] Primary Care Provider: Roma Schanz Other Clinician: Referring Provider: Treating Provider/Extender: Tharon Aquas in Treatment: 0 Active Problems ICD-10 Encounter Code Description Active Date MDM Diagnosis S81.801A Unspecified open wound, right lower leg, initial encounter 01/12/2021 No Yes S81.802A Unspecified open wound, left lower leg, initial encounter 01/12/2021 No Yes S40.812A Abrasion of left upper arm, initial encounter 01/12/2021 No  Yes Inactive Problems Resolved Problems Electronic Signature(s) Signed: 01/12/2021 1:07:30 PM By: Kalman Shan DO Entered By: Kalman Shan on 01/12/2021 12:49:44 -------------------------------------------------------------------------------- Progress Note Details Patient Name: Date of Service: [name], [name] 01/12/2021 7:30 A M Medical Record Number: [id number] Patient Account Number: [id number] Date of Birth/Sex: Treating RN: 05-12-1941 (80 y.o. [name] Primary Care Provider: Roma Schanz Other Clinician: Referring Provider: Treating Provider/Extender: Tharon Aquas in Treatment: 0 Subjective Chief Complaint Information obtained from Patient Wounds to his left upper extremity and bilateral lower extremities. History of Present Illness (HPI) Admission 7/15 Mr. Jaiquan Epple is an 80 year old male with a past medical history of type 2 diabetes on oral agents, diffuse large B-cell lymphoma, and coronary artery disease status post CABG that presents to the clinic for wounds to his left upper extremity and bilateral lower extremities. He states that he went to urgent care to be evaluated for a fever. And he was instructed to go to the ED to be further evaluated for possible sepsis. Unfortunately he passed out in his car After his appointment and was not discovered for another 3 hours. He was eventually discovered and EMS had to take him out of his vehicle. This is when he developed abrasions to  his left arm and legs. He has been using Xeroform to his wound beds every other day. He reports improvement to all the wounds except for the one on the back of his right leg. He denies pain or signs of infection. Patient History Information obtained from Patient. Allergies benazepril, hydrochlorothiazide, aspirin, lactose Family History Cancer - Mother, Diabetes - Maternal Grandparents, Heart Disease - Paternal Grandparents, Hypertension -  Paternal Grandparents, Seizures - Child, Stroke - Father, No family history of Hereditary Spherocytosis, Kidney Disease, Lung Disease, Thyroid Problems, Tuberculosis. Social History Never smoker, Marital Status - Divorced, Alcohol Use - Never, Drug Use - No History, Caffeine Use - Rarely. Medical History Eyes Patient has history of Cataracts - Surgery Hematologic/Lymphatic Patient has history of Anemia Respiratory Patient has history of Sleep Apnea Cardiovascular Patient has history of Arrhythmia, Congestive Heart Failure, Coronary Artery Disease, Hypertension Endocrine Patient has history of Type II Diabetes Musculoskeletal Patient has history of Osteoarthritis Neurologic Patient has history of Neuropathy Oncologic Patient has history of Received Chemotherapy Denies history of Received Radiation Patient is treated with Oral Agents. Blood sugar is tested. Medical A Surgical History Notes nd Musculoskeletal Spinal Stenosis Oncologic Large B Cell Lymphoma Review of Systems (ROS) Ear/Nose/Mouth/Throat Denies complaints or symptoms of Chronic sinus problems or rhinitis. Gastrointestinal Denies complaints or symptoms of Frequent diarrhea, Nausea, Vomiting. Genitourinary Denies complaints or symptoms of Frequent urination. Integumentary (Skin) Complains or has symptoms of Wounds - legs. Psychiatric Denies complaints or symptoms of Claustrophobia, Suicidal. Objective Constitutional respirations regular, non-labored and within target range for patient.. Vitals Time Taken: 7:48 AM, Height: 72 in, Source: Stated, Weight: 235 lbs, Source: Stated, BMI: 31.9, Temperature: 97.6 F, Pulse: 89 bpm, Respiratory Rate: 18 breaths/min, Blood Pressure: 110/66 mmHg, Capillary Blood Glucose: 100 mg/dl. Cardiovascular 2+ dorsalis pedis/posterior tibialis pulses. Psychiatric pleasant and cooperative. General Notes: Left upper extremity: Wound limited to skin breakdown. This appears almost  closed. Right lower extremity: T the posterior aspect there is an o open wound with granulation tissue and nonviable tissue present. T the upper anterior portion there is a small wound limited to skin breakdown Left lower o extremity: T the anterior aspect there is a wound limited to skin breakdown but with some nonviable tissue present. No signs of infection to all wounds. 1+ o pitting edema to lower extremities bilaterally Integumentary (Hair, Skin) Wound #1 status is Open. Original cause of wound was Trauma. The date acquired was: 12/24/2020. The wound is located on the Left,Lateral Lower Leg. The wound measures 9cm length x 6cm width x 0.1cm depth; 42.412cm^2 area and 4.241cm^3 volume. There is Fat Layer (Subcutaneous Tissue) exposed. There is no tunneling or undermining noted. There is a medium amount of serosanguineous drainage noted. The wound margin is distinct with the outline attached to the wound base. There is large (67-100%) red, pink granulation within the wound bed. There is no necrotic tissue within the wound bed. Wound #2 status is Open. Original cause of wound was Trauma. The date acquired was: 12/24/2020. The wound is located on the Right,Posterior Lower Leg. The wound measures 5cm length x 3.5cm width x 0.2cm depth; 13.744cm^2 area and 2.749cm^3 volume. There is Fat Layer (Subcutaneous Tissue) exposed. There is no tunneling or undermining noted. There is a medium amount of serosanguineous drainage noted. The wound margin is distinct with the outline attached to the wound base. There is large (67-100%) red, pink granulation within the wound bed. There is a small (1-33%) amount of necrotic tissue within the wound bed including  Adherent Slough. Wound #3 status is Open. Original cause of wound was Trauma. The date acquired was: 12/24/2020. The wound is located on the Right,Lateral Lower Leg. The wound measures 1.6cm length x 1.2cm width x 0.1cm depth; 1.508cm^2 area and 0.151cm^3  volume. There is Fat Layer (Subcutaneous Tissue) exposed. There is no tunneling or undermining noted. There is a medium amount of serosanguineous drainage noted. The wound margin is distinct with the outline attached to the wound base. There is large (67-100%) red, pink granulation within the wound bed. There is no necrotic tissue within the wound bed. Wound #4 status is Open. Original cause of wound was Trauma. The date acquired was: 12/24/2020. The wound is located on the Left,Lateral Forearm. The wound measures 0.6cm length x 0.4cm width x 0.1cm depth; 0.188cm^2 area and 0.019cm^3 volume. There is Fat Layer (Subcutaneous Tissue) exposed. There is no tunneling or undermining noted. There is a medium amount of sanguinous drainage noted. The wound margin is distinct with the outline attached to the wound base. There is large (67-100%) red granulation within the wound bed. There is no necrotic tissue within the wound bed. Assessment Active Problems ICD-10 Unspecified open wound, right lower leg, initial encounter Unspecified open wound, left lower leg, initial encounter Abrasion of left upper arm, initial encounter All wounds appear well-healing except for the posterior right leg wound. This wound had nonviable tissue and I debrided it. No signs of infection. I think he would benefit from [place] [facility] to this area. I would also recommend a light compression with Kerlix/Coban to help decrease swelling. His ABIs were noncompressible bilaterally however he has excellent pulses. We will use silver alginate to the more proximal wound on the right extremity. T the left o lower extremity the wound is located higher than where the wrap would hit. For this I recommended dressing changes every other day with silver alginate. For the left arm wound that is almost healed he can continue using Vaseline dressings. Procedures Wound #1 Pre-procedure diagnosis of Wound #1 is a Trauma, Other located on  the Left,Lateral Lower Leg . There was a Excisional Skin/Subcutaneous Tissue Debridement with a total area of 5.25 sq cm performed by Kalman Shan, DO. With the following instrument(s): Curette to remove Viable and Non-Viable tissue/material. Material removed includes Subcutaneous Tissue and Slough and after achieving pain control using Other (Benzocaine 20%). No specimens were taken. A time out was conducted at 09:10, prior to the start of the procedure. A Minimum amount of bleeding was controlled with Pressure. The procedure was tolerated well with a pain level of 0 throughout and a pain level of 0 following the procedure. Post Debridement Measurements: 9cm length x 6cm width x 0.1cm depth; 4.241cm^3 volume. Character of Wound/Ulcer Post Debridement requires further debridement. Post procedure Diagnosis Wound #1: Same as Pre-Procedure Wound #2 Pre-procedure diagnosis of Wound #2 is a Trauma, Other located on the Right,Posterior Lower Leg . There was a Excisional Skin/Subcutaneous Tissue Debridement with a total area of 17.5 sq cm performed by Kalman Shan, DO. With the following instrument(s): Curette to remove Viable and Non-Viable tissue/material. Material removed includes Subcutaneous Tissue, Slough, and Skin: Dermis after achieving pain control using Other (Benzocaine 20%). No specimens were taken. A time out was conducted at 09:10, prior to the start of the procedure. A Minimum amount of bleeding was controlled with Pressure. The procedure was tolerated well with a pain level of 0 throughout and a pain level of 0 following the procedure. Post Debridement  Measurements: 5cm length x 3.5cm width x 0.1cm depth; 1.374cm^3 volume. Character of Wound/Ulcer Post Debridement requires further debridement. Post procedure Diagnosis Wound #2: Same as Pre-Procedure Plan Follow-up Appointments: Return Appointment in 1 week. Bathing/ Shower/ Hygiene: May shower with protection but do not get  wound dressing(s) wet. Edema Control - Lymphedema / SCD / Other: Elevate legs to the level of the heart or above for 30 minutes daily and/or when sitting, a frequency of: - throughout the day Avoid standing for long periods of time. Exercise regularly WOUND #1: - Lower Leg Wound Laterality: Left, Lateral Prim Dressing: KerraCel Ag Gelling Fiber Dressing, 2x2 in (silver alginate) (DME) (Dispense As Written) Every Other Day/30 Days ary Discharge Instructions: Apply silver alginate to wound bed as instructed Secondary Dressing: Zetuvit Plus Silicone Border Dressing 5x5 (in/in) (DME) (Generic) Every Other Day/30 Days Discharge Instructions: Apply silicone border over primary dressing as directed. WOUND #2: - Lower Leg Wound Laterality: Right, Posterior Peri-Wound Care: Sween Lotion (Moisturizing lotion) 1 x Per Week/15 Days Discharge Instructions: Apply moisturizing lotion as directed Prim Dressing: Hydrofera Blue Ready Foam, 2.5 x2.5 in 1 x Per Week/15 Days ary Discharge Instructions: Apply to wound bed as instructed Prim Dressing: Santyl Ointment 1 x Per Week/15 Days ary Discharge Instructions: Apply nickel thick amount to wound bed as instructed Secondary Dressing: ABD Pad, 5x9 1 x Per Week/15 Days Discharge Instructions: Apply over primary dressing as directed. Com pression Wrap: Kerlix Roll 4.5x3.1 (in/yd) 1 x Per Week/15 Days Discharge Instructions: Apply Kerlix and Coban compression as directed. Com pression Wrap: Coban Self-Adherent Wrap 4x5 (in/yd) 1 x Per Week/15 Days Discharge Instructions: Apply over Kerlix as directed. WOUND #3: - Lower Leg Wound Laterality: Right, Lateral Peri-Wound Care: Sween Lotion (Moisturizing lotion) Discharge Instructions: Apply moisturizing lotion as directed Prim Dressing: KerraCel Ag Gelling Fiber Dressing, 2x2 in (silver alginate) ary Discharge Instructions: Apply silver alginate to wound bed as instructed Secondary Dressing: Woven Gauze Sponge,  Non-Sterile 4x4 in Discharge Instructions: Apply over primary dressing as directed. Com pression Wrap: Kerlix Roll 4.5x3.1 (in/yd) Discharge Instructions: Apply Kerlix and Coban compression as directed. Com pression Wrap: Coban Self-Adherent Wrap 4x5 (in/yd) Discharge Instructions: Apply over Kerlix as directed. WOUND #4: - Forearm Wound Laterality: Left, Lateral Prim Dressing: Xeroform Occlusive Gauze Dressing, 4x4 in Every Other Day/15 Days ary Discharge Instructions: Apply to wound bed as instructed Secondary Dressing: Zetuvit Plus Silicone Border Dressing 4x4 (in/in) Every Other Day/15 Days Discharge Instructions: Apply silicone border over primary dressing as directed. 1. Hydrofera Blue with Santyl under Kerlix/Cobanooright lower extremity posterior wound 2. Silver alginateooright and left lower extremity proximal wound 3. Vaseline gauzeooleft arm 4. In office sharp debridement 5. Follow-up in 1 week Electronic Signature(s) Signed: 01/12/2021 1:07:30 PM By: Kalman Shan DO Entered By: Kalman Shan on 01/12/2021 13:06:40 -------------------------------------------------------------------------------- HxROS Details Patient Name: Date of Service: [name], [name] 01/12/2021 7:30 A M Medical Record Number: [id number] Patient Account Number: [id number] Date of Birth/Sex: Treating RN: 1940-12-20 (80 y.o. Marcheta Grammes Primary Care Provider: Roma Schanz Other Clinician: Referring Provider: Treating Provider/Extender: Tharon Aquas in Treatment: 0 Information Obtained From Patient Ear/Nose/Mouth/Throat Complaints and Symptoms: Negative for: Chronic sinus problems or rhinitis Gastrointestinal Complaints and Symptoms: Negative for: Frequent diarrhea; Nausea; Vomiting Genitourinary Complaints and Symptoms: Negative for: Frequent urination Integumentary (Skin) Complaints and Symptoms: Positive for: Wounds -  legs Psychiatric Complaints and Symptoms: Negative for: Claustrophobia; Suicidal Eyes Medical History: Positive for: Cataracts - Surgery Hematologic/Lymphatic Medical History: Positive for:  Anemia Respiratory Medical History: Positive for: Sleep Apnea Cardiovascular Medical History: Positive for: Arrhythmia; Congestive Heart Failure; Coronary Artery Disease; Hypertension Endocrine Medical History: Positive for: Type II Diabetes Time with diabetes: 20 years Treated with: Oral agents Blood sugar tested every day: Yes Tested : Q am Immunological Musculoskeletal Medical History: Positive for: Osteoarthritis Past Medical History Notes: Spinal Stenosis Neurologic Medical History: Positive for: Neuropathy Oncologic Medical History: Positive for: Received Chemotherapy Negative for: Received Radiation Past Medical History Notes: Large B Cell Lymphoma HBO Extended History Items Eyes: Cataracts Immunizations Pneumococcal Vaccine: Received Pneumococcal Vaccination: Yes Implantable Devices Yes Family and Social History Cancer: Yes - Mother; Diabetes: Yes - Maternal Grandparents; Heart Disease: Yes - Paternal Grandparents; Hereditary Spherocytosis: No; Hypertension: Yes - Paternal Grandparents; Kidney Disease: No; Lung Disease: No; Seizures: Yes - Child; Stroke: Yes - Father; Thyroid Problems: No; Tuberculosis: No; Never smoker; Marital Status - Divorced; Alcohol Use: Never; Drug Use: No History; Caffeine Use: Rarely; Financial Concerns: No; Food, Clothing or Shelter Needs: No; Support System Lacking: No; Transportation Concerns: No Electronic Signature(s) Signed: 01/12/2021 12:14:30 PM By: Lorrin Jackson Signed: 01/12/2021 1:07:30 PM By: Kalman Shan DO Entered By: Lorrin Jackson on 01/12/2021 08:14:10 -------------------------------------------------------------------------------- SuperBill Details Patient Name: Date of Service: [name], [name] 01/12/2021 Medical  Record Number: [id number] Patient Account Number: [id number] Date of Birth/Sex: Treating RN: 08/27/1940 (80 y.o. [name] Primary Care Provider: Roma Schanz Other Clinician: Referring Provider: Treating Provider/Extender: Tharon Aquas in Treatment: 0 Diagnosis Coding ICD-10 Codes Code Description [phone] Unspecified open wound, right lower leg, initial encounter S81.802A Unspecified open wound, left lower leg, initial encounter S40.812A Abrasion of left upper arm, initial encounter Facility Procedures CPT4 Code: [id number] Description: [place] VISIT-LEV 3 EST PT Modifier: 25 Quantity: 1 CPT4 Code: [id number] Description: [place] TISSUE 20 SQ CM/< ICD-10 Diagnosis Description S81.802A Unspecified open wound, left lower leg, initial encounter Modifier: Quantity: 1 CPT4 Code: [id number] Description: 97353 - DEB SUBQ TISS EA ADDL 20CM ICD-10 Diagnosis Description S81.801A Unspecified open wound, right lower leg, initial encounter Modifier: Quantity: 1 Physician Procedures : CPT4 Code Description Modifier [id number] 83419 - WC PHYS LEVEL 3 - EST PT ICD-10 Diagnosis Description S81.801A Unspecified open wound, right lower leg, initial encounter S81.802A Unspecified open wound, left lower leg, initial encounter S40.812A  Abrasion of left upper arm, initial encounter Quantity: 1 : [id number] 11042 - WC PHYS SUBQ TISS 20 SQ CM ICD-10 Diagnosis Description S81.802A Unspecified open wound, left lower leg, initial encounter Quantity: 1 : [id number] 11045 - WC PHYS SUBQ TISS EA ADDL 20 CM ICD-10 Diagnosis Description S81.801A Unspecified open wound, right lower leg, initial encounter Quantity: 1 Electronic Signature(s) Signed: 01/19/2021 2:46:51 PM By: Kalman Shan DO Previous Signature: 01/16/2021 8:42:46 AM Version By: Kalman Shan DO Previous Signature: 01/16/2021 6:27:59 PM Version By: Baruch Gouty RN, BSN Previous Signature:  01/12/2021 1:07:30 PM Version By: Kalman Shan DO Entered By: Kalman Shan on 01/19/2021 14:37:12

## 2021-01-12 NOTE — Progress Notes (Addendum)
BENNET, KUJAWA ([id number]) Visit Report for 01/12/2021 Allergy List Details Patient Name: Date of Service: Richard [name] [name] 01/12/2021 7:30 A M Medical Record Number: [id number] Patient Account Number: [id number] Date of Birth/Sex: Treating RN: [birth year] (80 y.o. Male) Lorrin Jackson Primary Care Kieli Golladay: Roma Schanz Other Clinician: Referring Tkeyah Burkman: Treating Zamarah Ullmer/Extender: Melina Fiddler Weeks in Treatment: 0 Allergies Active Allergies benazepril hydrochlorothiazide aspirin lactose Allergy Notes Electronic Signature(s) Signed: 01/12/2021 12:14:30 PM By: Lorrin Jackson Entered By: Lorrin Jackson on 01/12/2021 07:48:24 -------------------------------------------------------------------------------- Arrival Information Details Patient Name: Date of Service: Richard [name], Richard [name] [name] 01/12/2021 7:30 A M Medical Record Number: [id number] Patient Account Number: [id number] Date of Birth/Sex: Treating RN: 1941/01/17 (80 y.o. Male) Lorrin Jackson Primary Care Sander Remedios: Roma Schanz Other Clinician: Referring Marchell Froman: Treating Aras Albarran/Extender: Tharon Aquas in Treatment: 0 Visit Information Patient Arrived: Ambulatory Arrival Time: 07:44 Transfer Assistance: None Patient Identification Verified: Yes Secondary Verification Process Completed: Yes Patient Requires Transmission-Based Precautions: No Patient Has Alerts: Yes Patient Alerts: Patient on Blood Thinner Electronic Signature(s) Signed: 01/12/2021 12:14:30 PM By: Lorrin Jackson Entered By: Lorrin Jackson on 01/12/2021 07:44:57 -------------------------------------------------------------------------------- Clinic Level of Care Assessment Details Patient Name: Date of Service: Richard [name] [name] 01/12/2021 7:30 A M Medical Record Number: [id number] Patient Account Number: [id number] Date of Birth/Sex: Treating RN: Nov 15, 1940 (80 y.o. Male) Baruch Gouty Primary Care Jadaya Sommerfield: Roma Schanz Other Clinician: Referring [name]: Treating Jauna Raczynski/Extender: Tharon Aquas in Treatment: 0 Clinic Level of Care Assessment Items TOOL 1 Quantity Score []  - 0 Use when EandM and Procedure is performed on INITIAL visit ASSESSMENTS - Nursing Assessment / Reassessment X- 1 20 General Physical Exam (combine w/ comprehensive assessment (listed just below) when performed on new pt. evals) X- 1 25 Comprehensive Assessment (HX, ROS, Risk Assessments, Wounds Hx, etc.) ASSESSMENTS - Wound and Skin Assessment / Reassessment []  - 0 Dermatologic / Skin Assessment (not related to wound area) ASSESSMENTS - Ostomy and/or Continence Assessment and Care []  - 0 Incontinence Assessment and Management []  - 0 Ostomy Care Assessment and Management (repouching, etc.) PROCESS - Coordination of Care X - Simple Patient / Family Education for ongoing care 1 15 []  - 0 Complex (extensive) Patient / Family Education for ongoing care X- 1 10 Staff obtains Programmer, systems, Records, T Results / Process Orders est []  - 0 Staff telephones HHA, Nursing Homes / Clarify orders / etc []  - 0 Routine Transfer to another Facility (non-emergent condition) []  - 0 Routine Hospital Admission (non-emergent condition) X- 1 15 New Admissions / Biomedical engineer / Ordering NPWT Apligraf, etc. , []  - 0 Emergency Hospital Admission (emergent condition) PROCESS - Special Needs []  - 0 Pediatric / Minor Patient Management []  - 0 Isolation Patient Management []  - 0 Hearing / Language / Visual special needs []  - 0 Assessment of Community assistance (transportation, D/C planning, etc.) []  - 0 Additional assistance / Altered mentation []  - 0 Support Surface(s) Assessment (bed, cushion, seat, etc.) INTERVENTIONS - Miscellaneous []  - 0 External ear exam []  - 0 Patient Transfer (multiple staff / Civil Service fast streamer / Similar devices) []  - 0 Simple  Staple / Suture removal (25 or less) []  - 0 Complex Staple / Suture removal (26 or more) []  - 0 Hypo/Hyperglycemic Management (do not check if billed separately) X- 1 15 Ankle / Brachial Index (ABI) - do not check if billed separately Has the patient been seen at the hospital within the last three years: Yes Total Score: 100  Level Of Care: New/Established - Level 3 Electronic Signature(s) Signed: 01/12/2021 12:39:56 PM By: Baruch Gouty RN, BSN Entered By: Baruch Gouty on 01/12/2021 10:06:47 -------------------------------------------------------------------------------- Lower Extremity Assessment Details Patient Name: Date of Service: Richard [name], Richard [name] [name] 01/12/2021 7:30 A M Medical Record Number: [id number] Patient Account Number: [id number] Date of Birth/Sex: Treating RN: Oct 16, 1940 (80 y.o. Male) Lorrin Jackson Primary Care Maximina Pirozzi: Roma Schanz Other Clinician: Referring Eshawn Coor: Treating Barnett Elzey/Extender: Tharon Aquas in Treatment: 0 Edema Assessment Assessed: Shirlyn Goltz: Yes] Patrice Paradise: Yes] Edema: [Left: No] [Right: Yes] Calf Left: Right: Point of Measurement: 30 cm From Medial Instep 35.5 cm 36.5 cm Ankle Left: Right: Point of Measurement: 9 cm From Medial Instep 24.5 cm 27.4 cm Knee To Floor Left: Right: From Medial Instep 42 cm 42 cm Vascular Assessment Pulses: Dorsalis Pedis Palpable: [Left:Yes] [Right:Yes] Blood Pressure: Brachial: [Left:110] Ankle: [Left:Dorsalis Pedis: 155 1.41] Notes L ABI=Bucks Electronic Signature(s) Signed: 01/12/2021 12:14:30 PM By: Lorrin Jackson Entered By: Lorrin Jackson on 01/12/2021 08:05:47 -------------------------------------------------------------------------------- Multi Wound Chart Details Patient Name: Date of Service: Richard [name], Richard [name] [name] 01/12/2021 7:30 A M Medical Record Number: [id number] Patient Account Number: [id number] Date of Birth/Sex: Treating RN: 03-01-1941 (80 y.o. Male)  Baruch Gouty Primary Care Evanna Washinton: Roma Schanz Other Clinician: Referring Rosalin Buster: Treating Ladean Steinmeyer/Extender: Tharon Aquas in Treatment: 0 Vital Signs Height(in): 72 Capillary Blood Glucose(mg/dl): 100 Weight(lbs): 235 Pulse(bpm): 51 Body Mass Index(BMI): 67 Blood Pressure(mmHg): 110/66 Temperature(F): 97.6 Respiratory Rate(breaths/min): 18 Photos: [1:No Photos Left, Lateral Lower Leg] [2:No Photos Right, Posterior Lower Leg] [3:No Photos Right, Lateral Lower Leg] Wound Location: [1:Trauma] [2:Trauma] [3:Trauma] Wounding Event: [1:Trauma, Other] [2:Trauma, Other] [3:Trauma, Other] Primary Etiology: [1:Cataracts, Anemia, Sleep Apnea,] [2:Cataracts, Anemia, Sleep Apnea,] [3:Cataracts, Anemia, Sleep Apnea,] Comorbid History: [1:Arrhythmia, Congestive Heart Failure, Arrhythmia, Congestive Heart Failure, Arrhythmia, Congestive Heart Failure, Coronary Artery Disease, Hypertension, Type II Diabetes, Osteoarthritis, Neuropathy, Received Osteoarthritis,  Neuropathy, Received Osteoarthritis, Neuropathy, Received Chemotherapy 12/24/2020] [2:Coronary Artery Disease, Hypertension, Type II Diabetes, Chemotherapy 12/24/2020] [3:Coronary Artery Disease, Hypertension, Type II Diabetes, Chemotherapy 12/24/2020] Date Acquired: [1:0] [2:0] [3:0] Weeks of Treatment: [1:Open] [2:Open] [3:Open] Wound Status: [1:9x6x0.1] [2:5x3.5x0.2] [3:1.6x1.2x0.1] Measurements L x W x D (cm) [1:42.412] [2:13.744] [3:1.508] A (cm) : rea [1:4.241] [2:2.749] [3:0.151] Volume (cm) : [1:0.00%] [2:N/A] [3:N/A] % Reduction in A [1:rea: 0.00%] [2:N/A] [3:N/A] % Reduction in Volume: [1:Full Thickness Without Exposed] [2:Full Thickness Without Exposed] [3:Full Thickness Without Exposed] Classification: [1:Support Structures Medium] [2:Support Structures Medium] [3:Support Structures Medium] Exudate A mount: [1:Serosanguineous] [2:Serosanguineous] [3:Serosanguineous] Exudate Type:  [1:red, brown] [2:red, brown] [3:red, brown] Exudate Color: [1:Distinct, outline attached] [2:Distinct, outline attached] [3:Distinct, outline attached] Wound Margin: [1:Large (67-100%)] [2:Large (67-100%)] [3:Large (67-100%)] Granulation A mount: [1:Red, Pink] [2:Red, Pink] [3:Red, Pink] Granulation Quality: [1:None Present (0%)] [2:Small (1-33%)] [3:None Present (0%)] Necrotic A mount: [1:Fat Layer (Subcutaneous Tissue): Yes Fat Layer (Subcutaneous Tissue): Yes Fat Layer (Subcutaneous Tissue): Yes] Exposed Structures: [1:Fascia: No Tendon: No Muscle: No Joint: No Bone: No None] [2:Fascia: No Tendon: No Muscle: No Joint: No Bone: No None] [3:Fascia: No Tendon: No Muscle: No Joint: No Bone: No None] Epithelialization: [1:Debridement - Excisional] [2:Debridement - Excisional] [3:N/A] Debridement: Pre-procedure Verification/Time Out 09:10 [2:09:10] [3:N/A] Taken: [1:Other] [2:Other] [3:N/A] Pain Control: [1:Subcutaneous, Slough] [2:Subcutaneous, Slough] [3:N/A] Tissue Debrided: [1:Skin/Subcutaneous Tissue] [2:Skin/Subcutaneous Tissue] [3:N/A] Level: [1:5.25] [2:17.5] [3:N/A] Debridement A (sq cm): [1:rea Curette] [2:Curette] [3:N/A] Instrument: [1:Minimum] [2:Minimum] [3:N/A] Bleeding: [1:Pressure] [2:Pressure] [3:N/A] Hemostasis A chieved: [1:0] [2:0] [3:N/A] Procedural Pain: [1:0] [2:0] [3:N/A] Post Procedural Pain: [1:Procedure was tolerated  well] [2:Procedure was tolerated well] [3:N/A] Debridement Treatment Response: [1:9x6x0.1] [2:5x3.5x0.1] [3:N/A] Post Debridement Measurements L x W x D (cm) [1:4.241] [2:1.374] [3:N/A] Post Debridement Volume: (cm) [1:Debridement] [2:Debridement] [3:N/A] Wound Number: 4 N/A N/A Photos: No Photos N/A N/A Left, Lateral Forearm N/A N/A Wound Location: Trauma N/A N/A Wounding Event: Trauma, Other N/A N/A Primary Etiology: Cataracts, Anemia, Sleep Apnea, N/A N/A Comorbid History: Arrhythmia, Congestive Heart Failure, Coronary Artery  Disease, Hypertension, Type II Diabetes, Osteoarthritis, Neuropathy, Received Chemotherapy 12/24/2020 N/A N/A Date Acquired: 0 N/A N/A Weeks of Treatment: Open N/A N/A Wound Status: 0.6x0.4x0.1 N/A N/A Measurements L x W x D (cm) 0.188 N/A N/A A (cm) : rea 0.019 N/A N/A Volume (cm) : N/A N/A N/A % Reduction in Area: N/A N/A N/A % Reduction in Volume: Full Thickness Without Exposed N/A N/A Classification: Support Structures Medium N/A N/A Exudate Amount: Sanguinous N/A N/A Exudate Type: red N/A N/A Exudate Color: Distinct, outline attached N/A N/A Wound Margin: Large (67-100%) N/A N/A Granulation Amount: Red N/A N/A Granulation Quality: None Present (0%) N/A N/A Necrotic Amount: Fat Layer (Subcutaneous Tissue): Yes N/A N/A Exposed Structures: Fascia: No Tendon: No Muscle: No Joint: No Bone: No None N/A N/A Epithelialization: N/A N/A N/A Debridement: N/A N/A N/A Pain Control: N/A N/A N/A Tissue Debrided: N/A N/A N/A Level: N/A N/A N/A Debridement A (sq cm): rea N/A N/A N/A Instrument: N/A N/A N/A Bleeding: N/A N/A N/A Hemostasis A chieved: N/A N/A N/A Procedural Pain: N/A N/A N/A Post Procedural Pain: Debridement Treatment Response: N/A N/A N/A Post Debridement Measurements L x N/A N/A N/A W x D (cm) N/A N/A N/A Post Debridement Volume: (cm) N/A N/A N/A Procedures Performed: Treatment Notes Electronic Signature(s) Signed: 01/12/2021 1:07:30 PM By: Kalman Shan DO Signed: 01/15/2021 5:28:31 PM By: Baruch Gouty RN, BSN Entered By: Kalman Shan on 01/12/2021 12:49:51 -------------------------------------------------------------------------------- Multi-Disciplinary Care Plan Details Patient Name: Date of Service: Richard [name], Richard [name] [name] 01/12/2021 7:30 A M Medical Record Number: [id number] Patient Account Number: [id number] Date of Birth/Sex: Treating RN: 1940-08-02 (80 y.o. Male) Baruch Gouty Primary Care Taronda Comacho:  Roma Schanz Other Clinician: Referring Deontrae Drinkard: Treating Quindell Shere/Extender: Tharon Aquas in Treatment: 0 Multidisciplinary Care Plan reviewed with physician Active Inactive Wound/Skin Impairment Nursing Diagnoses: Impaired tissue integrity Knowledge deficit related to ulceration/compromised skin integrity Goals: Patient/caregiver will verbalize understanding of skin care regimen Date Initiated: 01/12/2021 Target Resolution Date: 02/09/2021 Goal Status: Active Ulcer/skin breakdown will have a volume reduction of 30% by week 4 Date Initiated: 01/12/2021 Target Resolution Date: 02/09/2021 Goal Status: Active Interventions: Assess patient/caregiver ability to obtain necessary supplies Assess patient/caregiver ability to perform ulcer/skin care regimen upon admission and as needed Assess ulceration(s) every visit Provide education on ulcer and skin care Treatment Activities: Skin care regimen initiated : 01/12/2021 Topical wound management initiated : 01/12/2021 Notes: Electronic Signature(s) Signed: 01/12/2021 12:39:56 PM By: Baruch Gouty RN, BSN Entered By: Baruch Gouty on 01/12/2021 08:48:28 -------------------------------------------------------------------------------- Pain Assessment Details Patient Name: Date of Service: Richard [name], Richard [name] [name] 01/12/2021 7:30 A M Medical Record Number: [id number] Patient Account Number: [id number] Date of Birth/Sex: Treating RN: 29-Jan-1941 (80 y.o. Male) Lorrin Jackson Primary Care Loranda Mastel: Roma Schanz Other Clinician: Referring Johnattan Strassman: Treating Shariece Viveiros/Extender: Tharon Aquas in Treatment: 0 Active Problems Location of Pain Severity and Description of Pain Patient Has Paino No Site Locations Pain Management and Medication Current Pain Management: Electronic Signature(s) Signed: 01/12/2021 12:14:30 PM By: Lorrin Jackson Entered By: Lorrin Jackson on  01/12/2021 07:50:14 --------------------------------------------------------------------------------  Patient/Caregiver Education Details Patient Name: Date of Service: Gavin Potters 7/15/2022andnbsp7:30 A M Medical Record Number: [id number] Patient Account Number: [id number] Date of Birth/Gender: Treating RN: [birth year] (80 y.o. Male) Baruch Gouty Primary Care Physician: Roma Schanz Other Clinician: Referring Physician: Treating Physician/Extender: Tharon Aquas in Treatment: 0 Education Assessment Education Provided To: Patient Education Topics Provided Wound/Skin Impairment: Handouts: Caring for Your Ulcer, Skin Care Do's and Dont's Methods: Explain/Verbal Responses: Reinforcements needed, State content correctly Electronic Signature(s) Signed: 01/12/2021 12:39:56 PM By: Baruch Gouty RN, BSN Entered By: Baruch Gouty on 01/12/2021 08:49:07 -------------------------------------------------------------------------------- Wound Assessment Details Patient Name: Date of Service: Richard [name] [name] 01/12/2021 7:30 A M Medical Record Number: [id number] Patient Account Number: [id number] Date of Birth/Sex: Treating RN: 1941/02/05 (80 y.o. Male) Lorrin Jackson Primary Care Alexx Giambra: Roma Schanz Other Clinician: Referring Ainhoa Rallo: Treating Kagen Kunath/Extender: Tharon Aquas in Treatment: 0 Wound Status Wound Number: 1 Primary Trauma, Other Etiology: Wound Location: Left, Lateral Lower Leg Wound Open Wounding Event: Trauma Status: Date Acquired: 12/24/2020 Comorbid Cataracts, Anemia, Sleep Apnea, Arrhythmia, Congestive Heart Weeks Of Treatment: 0 History: Failure, Coronary Artery Disease, Hypertension, Type II Diabetes, Clustered Wound: No Osteoarthritis, Neuropathy, Received Chemotherapy Wound Measurements Length: (cm) 9 Width: (cm) 6 Depth: (cm) 0.1 Area: (cm) 42.412 Volume: (cm)  4.241 % Reduction in Area: 0% % Reduction in Volume: 0% Epithelialization: None Tunneling: No Undermining: No Wound Description Classification: Full Thickness Without Exposed Support Structures Wound Margin: Distinct, outline attached Exudate Amount: Medium Exudate Type: Serosanguineous Exudate Color: red, brown Foul Odor After Cleansing: No Slough/Fibrino No Wound Bed Granulation Amount: Large (67-100%) Exposed Structure Granulation Quality: Red, Pink Fascia Exposed: No Necrotic Amount: None Present (0%) Fat Layer (Subcutaneous Tissue) Exposed: Yes Tendon Exposed: No Muscle Exposed: No Joint Exposed: No Bone Exposed: No Electronic Signature(s) Signed: 01/12/2021 12:14:30 PM By: Lorrin Jackson Entered By: Lorrin Jackson on 01/12/2021 08:17:08 -------------------------------------------------------------------------------- Wound Assessment Details Patient Name: Date of Service: Richard [name], Richard [name] [name] 01/12/2021 7:30 A M Medical Record Number: [id number] Patient Account Number: [id number] Date of Birth/Sex: Treating RN: 1940/09/03 (80 y.o. Male) Lorrin Jackson Primary Care Tab Rylee: Roma Schanz Other Clinician: Referring Aldea Avis: Treating Sheryll Dymek/Extender: Tharon Aquas in Treatment: 0 Wound Status Wound Number: 2 Primary Trauma, Other Etiology: Wound Location: Right, Posterior Lower Leg Wound Open Wounding Event: Trauma Status: Date Acquired: 12/24/2020 Comorbid Cataracts, Anemia, Sleep Apnea, Arrhythmia, Congestive Heart Weeks Of Treatment: 0 History: Failure, Coronary Artery Disease, Hypertension, Type II Diabetes, Clustered Wound: No Osteoarthritis, Neuropathy, Received Chemotherapy Wound Measurements Length: (cm) 5 Width: (cm) 3.5 Depth: (cm) 0.2 Area: (cm) 13.744 Volume: (cm) 2.749 % Reduction in Area: % Reduction in Volume: Epithelialization: None Tunneling: No Undermining: No Wound Description Classification: Full  Thickness Without Exposed Support Structures Wound Margin: Distinct, outline attached Exudate Amount: Medium Exudate Type: Serosanguineous Exudate Color: red, brown Foul Odor After Cleansing: No Slough/Fibrino Yes Wound Bed Granulation Amount: Large (67-100%) Exposed Structure Granulation Quality: Red, Pink Fascia Exposed: No Necrotic Amount: Small (1-33%) Fat Layer (Subcutaneous Tissue) Exposed: Yes Necrotic Quality: Adherent Slough Tendon Exposed: No Muscle Exposed: No Joint Exposed: No Bone Exposed: No Electronic Signature(s) Signed: 01/12/2021 12:14:30 PM By: Lorrin Jackson Entered By: Lorrin Jackson on 01/12/2021 08:18:58 -------------------------------------------------------------------------------- Wound Assessment Details Patient Name: Date of Service: Richard [name] [name] 01/12/2021 7:30 A M Medical Record Number: [id number] Patient Account Number: [id number] Date of Birth/Sex: Treating RN: Mar 18, 1941 (80 y.o. Male) Lorrin Jackson Primary Care Aryana Wonnacott: Roma Schanz Other Clinician:  Referring Zeek Rostron: Treating Haeleigh Streiff/Extender: Tharon Aquas in Treatment: 0 Wound Status Wound Number: 3 Primary Trauma, Other Etiology: Wound Location: Right, Lateral Lower Leg Wound Open Wounding Event: Trauma Status: Date Acquired: 12/24/2020 Comorbid Cataracts, Anemia, Sleep Apnea, Arrhythmia, Congestive Heart Weeks Of Treatment: 0 Weeks Of Treatment: 0 History: Failure, Coronary Artery Disease, Hypertension, Type II Diabetes, Clustered Wound: No Osteoarthritis, Neuropathy, Received Chemotherapy Wound Measurements Length: (cm) 1.6 Width: (cm) 1.2 Depth: (cm) 0.1 Area: (cm) 1.508 Volume: (cm) 0.151 % Reduction in Area: % Reduction in Volume: Epithelialization: None Tunneling: No Undermining: No Wound Description Classification: Full Thickness Without Exposed Support Structures Wound Margin: Distinct, outline attached Exudate  Amount: Medium Exudate Type: Serosanguineous Exudate Color: red, brown Foul Odor After Cleansing: No Slough/Fibrino No Wound Bed Granulation Amount: Large (67-100%) Exposed Structure Granulation Quality: Red, Pink Fascia Exposed: No Necrotic Amount: None Present (0%) Fat Layer (Subcutaneous Tissue) Exposed: Yes Tendon Exposed: No Muscle Exposed: No Joint Exposed: No Bone Exposed: No Electronic Signature(s) Signed: 01/12/2021 12:14:30 PM By: Lorrin Jackson Entered By: Lorrin Jackson on 01/12/2021 08:20:22 -------------------------------------------------------------------------------- Wound Assessment Details Patient Name: Date of Service: Richard [name], Richard [name] [name] 01/12/2021 7:30 A M Medical Record Number: [id number] Patient Account Number: [id number] Date of Birth/Sex: Treating RN: Jan 18, 1941 (80 y.o. Male) Lorrin Jackson Primary Care Janautica Netzley: Roma Schanz Other Clinician: Referring Enmanuel Zufall: Treating Shandora Koogler/Extender: Tharon Aquas in Treatment: 0 Wound Status Wound Number: 4 Primary Trauma, Other Etiology: Wound Location: Left, Lateral Forearm Wound Open Wounding Event: Trauma Status: Date Acquired: 12/24/2020 Comorbid Cataracts, Anemia, Sleep Apnea, Arrhythmia, Congestive Heart Weeks Of Treatment: 0 History: Failure, Coronary Artery Disease, Hypertension, Type II Diabetes, Clustered Wound: No Osteoarthritis, Neuropathy, Received Chemotherapy Wound Measurements Length: (cm) 0.6 Width: (cm) 0.4 Depth: (cm) 0.1 Area: (cm) 0.188 Volume: (cm) 0.019 % Reduction in Area: % Reduction in Volume: Epithelialization: None Tunneling: No Undermining: No Wound Description Classification: Full Thickness Without Exposed Support Structures Wound Margin: Distinct, outline attached Exudate Amount: Medium Exudate Type: Sanguinous Exudate Color: red Foul Odor After Cleansing: No Slough/Fibrino No Wound Bed Granulation Amount: Large (67-100%)  Exposed Structure Granulation Quality: Red Fascia Exposed: No Necrotic Amount: None Present (0%) Fat Layer (Subcutaneous Tissue) Exposed: Yes Tendon Exposed: No Muscle Exposed: No Joint Exposed: No Bone Exposed: No Electronic Signature(s) Signed: 01/12/2021 12:14:30 PM By: Lorrin Jackson Entered By: Lorrin Jackson on 01/12/2021 08:21:58 -------------------------------------------------------------------------------- Vitals Details Patient Name: Date of Service: Richard [name], Richard [name] [name] 01/12/2021 7:30 A M Medical Record Number: [id number] Patient Account Number: [id number] Date of Birth/Sex: Treating RN: [birth year] (80 y.o. Male) Lorrin Jackson Primary Care Maicol Bowland: Roma Schanz Other Clinician: Referring Assia Meanor: Treating Rolene Andrades/Extender: Tharon Aquas in Treatment: 0 Vital Signs Time Taken: 07:48 Temperature (F): 97.6 Height (in): 72 Pulse (bpm): 89 Source: Stated Respiratory Rate (breaths/min): 18 Weight (lbs): 235 Blood Pressure (mmHg): 110/66 Source: Stated Capillary Blood Glucose (mg/dl): 100 Body Mass Index (BMI): 31.9 Reference Range: 80 - 120 mg / dl Electronic Signature(s) Signed: 01/12/2021 12:14:30 PM By: Lorrin Jackson Entered By: Lorrin Jackson on 01/12/2021 07:49:55

## 2021-01-12 NOTE — Progress Notes (Signed)
MAUDE, GLOOR ([id number]) Visit Report for 01/12/2021 Abuse/Suicide Risk Screen Details Patient Name: Date of Service: Richard [name] [name] 01/12/2021 7:30 A M Medical Record Number: [id number] Patient Account Number: [id number] Date of Birth/Sex: Treating RN: August 18, 1940 (80 y.o. Male) Lorrin Jackson Primary Care Richard [name]: Richard [name] Other Clinician: Referring Richard [name]: Treating Richard [name]/Extender: Richard [name] in Treatment: 0 Abuse/Suicide Risk Screen Items Answer ABUSE RISK SCREEN: Has anyone close to you tried to hurt or harm you recentlyo No Do you feel uncomfortable with anyone in your familyo No Has anyone forced you do things that you didnt want to doo No Electronic Signature(s) Signed: 01/12/2021 12:14:30 PM By: Lorrin Jackson Entered By: Lorrin Jackson on 01/12/2021 07:50:04 -------------------------------------------------------------------------------- Activities of Daily Living Details Patient Name: Date of Service: Richard [name] [name] 01/12/2021 7:30 A M Medical Record Number: [id number] Patient Account Number: [id number] Date of Birth/Sex: Treating RN: 1941/04/22 (80 y.o. Male) Lorrin Jackson Primary Care Richard [name]: Richard [name] Other Clinician: Referring Richard [name]: Treating Richard [name]/Extender: Richard [name] in Treatment: 0 Activities of Daily Living Items Answer Activities of Daily Living (Please select one for each item) Drive Automobile Completely Able T Medications ake Completely Able Use T elephone Completely Able Care for Appearance Completely Able Use T oilet Completely Able Bath / Shower Completely Able Dress Self Completely Able Feed Self Completely Able Walk Completely Able Get In / Out Bed Completely Able Housework Completely Able Prepare Meals Completely Richard [name] for Self Completely Able Electronic Signature(s) Signed: 01/12/2021 12:14:30 PM By:  Lorrin Jackson Entered By: Lorrin Jackson on 01/12/2021 07:50:49 -------------------------------------------------------------------------------- Education Screening Details Patient Name: Date of Service: Richard [name] [name] 01/12/2021 7:30 A M Medical Record Number: [id number] Patient Account Number: [id number] Date of Birth/Sex: Treating RN: [birth year] (80 y.o. Male) Lorrin Jackson Primary Care Richard [name]: Richard [name] Other Clinician: Referring Richard [name]: Treating Richard [name]/Extender: Richard [name] in Treatment: 0 Primary Learner Assessed: Patient Learning Preferences/Education Level/Primary Language Preferred Language: English Cognitive Barrier Language Barrier: No Translator Needed: No Memory Deficit: No Emotional Barrier: No Cultural/Religious Beliefs Affecting Medical Care: No Physical Barrier Impaired Vision: No Impaired Hearing: No Decreased Hand dexterity: No Knowledge/Comprehension Knowledge Level: High Comprehension Level: High Ability to understand written instructions: High Ability to understand verbal instructions: High Motivation Anxiety Level: Calm Cooperation: Cooperative Education Importance: Acknowledges Need Interest in Health Problems: Asks Questions Perception: Coherent Willingness to Engage in Self-Management High Activities: Readiness to Engage in Self-Management High Activities: Electronic Signature(s) Signed: 01/12/2021 12:14:30 PM By: Lorrin Jackson Entered By: Lorrin Jackson on 01/12/2021 07:52:52 -------------------------------------------------------------------------------- Fall Risk Assessment Details Patient Name: Date of Service: Richard [name] [name] 01/12/2021 7:30 A M Medical Record Number: [id number] Patient Account Number: [id number] Date of Birth/Sex: Treating RN: 02/12/1941 (80 y.o. Male) Lorrin Jackson Primary Care Richard [name]: Richard [name] Other Clinician: Referring Richard [name]: Treating  Richard [name]/Extender: Richard [name] in Treatment: 0 Fall Risk Assessment Items Have you had 2 or more falls in the last 12 monthso 0 No Have you had any fall that resulted in injury in the last 12 monthso 0 No FALLS RISK SCREEN History of falling - immediate or within 3 months 0 No Secondary diagnosis (Do you have 2 or more medical diagnoseso) 0 No Ambulatory aid None/bed rest/wheelchair/nurse 0 Yes Crutches/cane/walker 0 No Furniture 0 No Intravenous therapy Access/Saline/Heparin Lock 0 No Gait/Transferring Normal/ bed rest/ wheelchair 0 Yes Weak (short steps with or without shuffle, stooped but able to lift head  while walking, may seek 0 No support from furniture) Impaired (short steps with shuffle, may have difficulty arising from chair, head down, impaired 0 No balance) Mental Status Oriented to own ability 0 Yes Electronic Signature(s) Signed: 01/12/2021 12:14:30 PM By: Lorrin Jackson Entered By: Lorrin Jackson on 01/12/2021 07:55:49 -------------------------------------------------------------------------------- Foot Assessment Details Patient Name: Date of Service: Richard [name] [name] 01/12/2021 7:30 A M Medical Record Number: [id number] Patient Account Number: [id number] Date of Birth/Sex: Treating RN: [birth year] (80 y.o. Male) Lorrin Jackson Primary Care Richard [name]: Richard [name] Other Clinician: Referring Richard [name]: Treating Richard [name]/Extender: Richard [name] in Treatment: 0 Foot Assessment Items Site Locations + = Sensation present, - = Sensation absent, C = Callus, U = Ulcer R = Redness, W = Warmth, M = Maceration, PU = Pre-ulcerative lesion F = Fissure, S = Swelling, D = Dryness Assessment Right: Left: Other Deformity: No No Prior Foot Ulcer: No No Prior Amputation: No No Charcot Joint: No No Ambulatory Status: Ambulatory Without Help Gait: Steady Electronic Signature(s) Signed: 01/12/2021 12:14:30 PM  By: Lorrin Jackson Entered By: Lorrin Jackson on 01/12/2021 08:07:49 -------------------------------------------------------------------------------- Nutrition Risk Screening Details Patient Name: Date of Service: Richard [name] [name] 01/12/2021 7:30 A M Medical Record Number: [id number] Patient Account Number: [id number] Date of Birth/Sex: Treating RN: [birth year] (80 y.o. Male) Lorrin Jackson Primary Care Shevy Yaney: Richard [name] Other Clinician: Referring Geanine Vandekamp: Treating Christy Ehrsam/Extender: Richard [name] in Treatment: 0 Height (in): 72 Weight (lbs): 235 Body Mass Index (BMI): 31.9 Nutrition Risk Screening Items Score Screening NUTRITION RISK SCREEN: I have an illness or condition that made me change the kind and/or amount of food I eat 0 No I eat fewer than two meals per day 0 No I eat few fruits and vegetables, or milk products 0 No I have three or more drinks of beer, liquor or wine almost every day 0 No I have tooth or mouth problems that make it hard for me to eat 0 No I don't always have enough money to buy the food I need 0 No I eat alone most of the time 0 No I take three or more different prescribed or over-the-counter drugs a day 1 Yes Without wanting to, I have lost or gained 10 pounds in the last six months 0 No I am not always physically able to shop, cook and/or feed myself 0 No Nutrition Protocols Good Risk Protocol 0 No interventions needed Moderate Risk Protocol High Risk Proctocol Risk Level: Good Risk Score: 1 Electronic Signature(s) Signed: 01/12/2021 12:14:30 PM By: Lorrin Jackson Entered By: Lorrin Jackson on 01/12/2021 07:52:13

## 2021-01-17 ENCOUNTER — Other Ambulatory Visit: Payer: Self-pay | Admitting: Cardiology

## 2021-01-17 ENCOUNTER — Telehealth: Payer: Self-pay | Admitting: *Deleted

## 2021-01-17 MED ORDER — APIXABAN 5 MG PO TABS: 5.0000 mg | ORAL_TABLET | Freq: Two times a day (BID) | ORAL | 1 refills | Status: DC

## 2021-01-17 NOTE — Telephone Encounter (Signed)
The pt's dose is changing per Dr. Rosezella [place] message:  Minus Breeding, MD  Brynda Peon, RN Many creatinine levels have been above 1.5 but it does look like it is trending down.  OK to increase to 5 mg bid.         Previous Messages    ----- Message -----  From: Brynda Peon, RN  Sent: 01/09/2021   1:43 PM EDT  To: Minus Breeding, MD   Pt last saw Dr Percival Spanish 11/21/20, last labs 12/26/20 Creat 1.35, age 80, weight 111.1kg, based on most recent serum creat pt is not on appropriate dosage of Eliquis however pt's creat fluctuates and has been >1.5 as recent as 12/20/20.  Discussed with Marcelle Overlie, pharmacist requested message be sent to Dr Percival Spanish to inquire if he thinks dose change is appropriate, or if he would like pt to continue on Eliquis 2.5mg  BID.  Please advise, Thanks.

## 2021-01-17 NOTE — Telephone Encounter (Signed)
Called pt since message was received from Dr. Percival Spanish to increase pt's Eliquis dose to 5mg  BID from 2.5mg  BID; spoke with pt and updated him and he verbalized understanding. He is aware to take Eliquis 5mg  twice a day and the reason for the dose increase.   Called the [facility] pharmacy and spoke with Gaspar Bidding, the pharmacist, and instructed him of the dose change and the appropriate refill was being sent. Also, advised him to remove the Eliquis 2.5mg  tablet off med list to avoid discrepancies   Denied the Eliquis 2.5mg  refill and removed Eliquis 2.5mg  off med list at this time.   Patient is 80 years old, weight-111.1kg, Crea-1.35 via [place] on 12/26/20 from [facility] and 1.51 on 12/20/20 per Epic, Diagnosis-Afib/flutter, and last seen by Dr. Caryl Comes on 11/24/2020 and Dr. Percival Spanish on 11/21/20.   Received: 3 days ago Minus Breeding, MD  Brynda Peon, RN Many creatinine levels have been above 1.5 but it does look like it is trending down.  OK to increase to 5 mg bid.         Previous Messages    ----- Message -----  From: Brynda Peon, RN  Sent: 01/09/2021   1:43 PM EDT  To: Minus Breeding, MD   Pt last saw Dr Percival Spanish 11/21/20, last labs 12/26/20 Creat 1.35, age 49, weight 111.1kg, based on most recent serum creat pt is not on appropriate dosage of Eliquis however pt's creat fluctuates and has been >1.5 as recent as 12/20/20.  Discussed with Marcelle Overlie, pharmacist requested message be sent to Dr Percival Spanish to inquire if he thinks dose change is appropriate, or if he would like pt to continue on Eliquis 2.5mg  BID.  Please advise, Thanks.

## 2021-01-19 ENCOUNTER — Encounter (HOSPITAL_BASED_OUTPATIENT_CLINIC_OR_DEPARTMENT_OTHER): Payer: Medicare Other | Admitting: Internal Medicine

## 2021-01-19 ENCOUNTER — Inpatient Hospital Stay: Payer: Medicare Other | Attending: Hematology & Oncology

## 2021-01-19 ENCOUNTER — Inpatient Hospital Stay: Payer: Medicare Other

## 2021-01-19 ENCOUNTER — Encounter: Payer: Self-pay | Admitting: Family

## 2021-01-19 ENCOUNTER — Inpatient Hospital Stay (HOSPITAL_BASED_OUTPATIENT_CLINIC_OR_DEPARTMENT_OTHER): Payer: Medicare Other | Admitting: Family

## 2021-01-19 ENCOUNTER — Other Ambulatory Visit: Payer: Self-pay

## 2021-01-19 VITALS — BP 121/69 | HR 85 | Temp 98.0°F | Resp 18 | Ht 72.0 in | Wt 240.0 lb

## 2021-01-19 DIAGNOSIS — D500 Iron deficiency anemia secondary to blood loss (chronic): Secondary | ICD-10-CM | POA: Diagnosis not present

## 2021-01-19 DIAGNOSIS — C8338 Diffuse large B-cell lymphoma, lymph nodes of multiple sites: Secondary | ICD-10-CM | POA: Diagnosis not present

## 2021-01-19 DIAGNOSIS — D519 Vitamin B12 deficiency anemia, unspecified: Secondary | ICD-10-CM

## 2021-01-19 DIAGNOSIS — S81801D Unspecified open wound, right lower leg, subsequent encounter: Secondary | ICD-10-CM | POA: Diagnosis not present

## 2021-01-19 DIAGNOSIS — S81802D Unspecified open wound, left lower leg, subsequent encounter: Secondary | ICD-10-CM

## 2021-01-19 DIAGNOSIS — M7989 Other specified soft tissue disorders: Secondary | ICD-10-CM | POA: Insufficient documentation

## 2021-01-19 DIAGNOSIS — G620 Drug-induced polyneuropathy: Secondary | ICD-10-CM | POA: Diagnosis not present

## 2021-01-19 DIAGNOSIS — D508 Other iron deficiency anemias: Secondary | ICD-10-CM

## 2021-01-19 DIAGNOSIS — S40812D Abrasion of left upper arm, subsequent encounter: Secondary | ICD-10-CM

## 2021-01-19 DIAGNOSIS — C8332 Diffuse large B-cell lymphoma, intrathoracic lymph nodes: Secondary | ICD-10-CM

## 2021-01-19 DIAGNOSIS — D510 Vitamin B12 deficiency anemia due to intrinsic factor deficiency: Secondary | ICD-10-CM | POA: Insufficient documentation

## 2021-01-19 LAB — IRON AND TIBC
Iron: 59 ug/dL (ref 45–182)
Saturation Ratios: 17 % — ABNORMAL LOW (ref 17.9–39.5)
TIBC: 340 ug/dL (ref 250–450)
UIBC: 281 ug/dL

## 2021-01-19 LAB — CBC WITH DIFFERENTIAL (CANCER CENTER ONLY)
Abs Immature Granulocytes: 0.08 10*3/uL — ABNORMAL HIGH (ref 0.00–0.07)
Basophils Absolute: 0 10*3/uL (ref 0.0–0.1)
Basophils Relative: 0 %
Eosinophils Absolute: 0.2 10*3/uL (ref 0.0–0.5)
Eosinophils Relative: 3 %
HCT: 37.5 % — ABNORMAL LOW (ref 39.0–52.0)
Hemoglobin: 12 g/dL — ABNORMAL LOW (ref 13.0–17.0)
Immature Granulocytes: 1 %
Lymphocytes Relative: 22 %
Lymphs Abs: 1.8 10*3/uL (ref 0.7–4.0)
MCH: 29.1 pg (ref 26.0–34.0)
MCHC: 32 g/dL (ref 30.0–36.0)
MCV: 90.8 fL (ref 80.0–100.0)
Monocytes Absolute: 0.6 10*3/uL (ref 0.1–1.0)
Monocytes Relative: 7 %
Neutro Abs: 5.5 10*3/uL (ref 1.7–7.7)
Neutrophils Relative %: 67 %
Platelet Count: 182 10*3/uL (ref 150–400)
RBC: 4.13 MIL/uL — ABNORMAL LOW (ref 4.22–5.81)
RDW: 16.3 % — ABNORMAL HIGH (ref 11.5–15.5)
WBC Count: 8.3 10*3/uL (ref 4.0–10.5)
nRBC: 0 % (ref 0.0–0.2)

## 2021-01-19 LAB — CMP (CANCER CENTER ONLY)
ALT: 11 U/L (ref 0–44)
AST: 14 U/L — ABNORMAL LOW (ref 15–41)
Albumin: 4.5 g/dL (ref 3.5–5.0)
Alkaline Phosphatase: 56 U/L (ref 38–126)
Anion gap: 9 (ref 5–15)
BUN: 30 mg/dL — ABNORMAL HIGH (ref 8–23)
CO2: 25 mmol/L (ref 22–32)
Calcium: 10.1 mg/dL (ref 8.9–10.3)
Chloride: 107 mmol/L (ref 98–111)
Creatinine: 1.39 mg/dL — ABNORMAL HIGH (ref 0.61–1.24)
GFR, Estimated: 51 mL/min — ABNORMAL LOW (ref >60)
Glucose, Bld: 102 mg/dL — ABNORMAL HIGH (ref 70–99)
Potassium: 4.2 mmol/L (ref 3.5–5.1)
Sodium: 141 mmol/L (ref 135–145)
Total Bilirubin: 0.4 mg/dL (ref 0.3–1.2)
Total Protein: 6.9 g/dL (ref 6.5–8.1)

## 2021-01-19 LAB — FERRITIN: Ferritin: 988 ng/mL — ABNORMAL HIGH (ref 24–336)

## 2021-01-19 LAB — RETICULOCYTES
Immature Retic Fract: 22.5 % — ABNORMAL HIGH (ref 2.3–15.9)
RBC.: 4.14 MIL/uL — ABNORMAL LOW (ref 4.22–5.81)
Retic Count, Absolute: 56.3 10*3/uL (ref 19.0–186.0)
Retic Ct Pct: 1.4 % (ref 0.4–3.1)

## 2021-01-19 LAB — VITAMIN B12: Vitamin B-12: 142 pg/mL — ABNORMAL LOW (ref 180–914)

## 2021-01-19 MED ORDER — CYANOCOBALAMIN 1000 MCG/ML IJ SOLN
1000.0000 ug | Freq: Once | INTRAMUSCULAR | Status: AC
  Administered 2021-01-19: 1000 ug via INTRAMUSCULAR

## 2021-01-19 MED ORDER — CYANOCOBALAMIN 1000 MCG/ML IJ SOLN
INTRAMUSCULAR | Status: AC
  Filled 2021-01-19: qty 1

## 2021-01-19 NOTE — Progress Notes (Signed)
CRESPIN, FORSTROM ([id number]) Visit Report for 01/19/2021 Chief Complaint Document Details Patient Name: Date of Service: [name] 01/19/2021 8:00 A M Medical Record Number: [id number] Patient Account Number: [id number] Date of Birth/Sex: Treating RN: 03/03/1941 (80 y.o. [name] Primary Care Provider: Roma Schanz Other Clinician: Referring Provider: Treating Provider/Extender: Tharon Aquas in Treatment: 1 Information Obtained from: Patient Chief Complaint Wounds to his left upper extremity and bilateral lower extremities. Electronic Signature(s) Signed: 01/19/2021 9:27:39 AM By: Kalman Shan DO Entered By: Kalman Shan on 01/19/2021 09:23:08 -------------------------------------------------------------------------------- Debridement Details Patient Name: Date of Service: [name], [name] 01/19/2021 8:00 A M Medical Record Number: [id number] Patient Account Number: [id number] Date of Birth/Sex: Treating RN: 02-Jun-1941 (80 y.o. [name] Primary Care Provider: Roma Schanz Other Clinician: Referring Provider: Treating Provider/Extender: Tharon Aquas in Treatment: 1 Debridement Performed for Assessment: Wound #2 Right,Posterior Lower Leg Performed By: Clinician Baruch Gouty, RN Debridement Type: Chemical/Enzymatic/Mechanical Agent Used: Santyl Level of Consciousness (Pre-procedure): Awake and Alert Pre-procedure Verification/Time Out No Taken: Bleeding: None Response to Treatment: Procedure was tolerated well Level of Consciousness (Post- Awake and Alert procedure): Post Debridement Measurements of Total Wound Length: (cm) 4.4 Width: (cm) 4.1 Depth: (cm) 0.1 Volume: (cm) 1.417 Character of Wound/Ulcer Post Debridement: Requires Further Debridement Post Procedure Diagnosis Same as Pre-procedure Electronic Signature(s) Signed: 01/19/2021 9:27:39 AM By: Kalman Shan DO Signed: 01/19/2021 1:37:28 PM By: Baruch Gouty RN, BSN Entered By: Baruch Gouty on 01/19/2021 09:14:53 -------------------------------------------------------------------------------- HPI Details Patient Name: Date of Service: [name], [name] 01/19/2021 8:00 A M Medical Record Number: [id number] Patient Account Number: [id number] Date of Birth/Sex: Treating RN: 1940-09-12 (80 y.o. [name] Primary Care Provider: Roma Schanz Other Clinician: Referring Provider: Treating Provider/Extender: Tharon Aquas in Treatment: 1 History of Present Illness HPI Description: Admission 7/15 Mr. [name] Hineman is an 80 year old male with a past medical history of type 2 diabetes on oral agents, diffuse large B-cell lymphoma, and coronary artery disease status post CABG that presents to the clinic for wounds to his left upper extremity and bilateral lower extremities. He states that he went to urgent care to be evaluated for a fever. And he was instructed to go to the ED to be further evaluated for possible sepsis. Unfortunately he passed out in his car After his appointment and was not discovered for another 3 hours. He was eventually discovered and EMS had to take him out of his vehicle. This is when he developed abrasions to his left arm and legs. He has been using Xeroform to his wound beds every other day. He reports improvement to all the wounds except for the one on the back of his right leg. He denies pain or signs of infection. 7/22; patient presents for 1 week follow-up. He has no issues or complaints today. He has tolerated the compression wrap well on his right lower extremity. He has been able to do the dressing changes without issues to his left leg and left arm. His left arm wound has healed. He denies signs of infection. Electronic Signature(s) Signed: 01/19/2021 9:27:39 AM By: Kalman Shan DO Entered By: Kalman Shan on  01/19/2021 09:23:41 -------------------------------------------------------------------------------- Physical Exam Details Patient Name: Date of Service: [name], [name] 01/19/2021 8:00 A M Medical Record Number: [id number] Patient Account Number: [id number] Date of Birth/Sex: Treating RN: 29-Nov-1940 (80 y.o. [name] Primary Care Provider: Roma Schanz Other Clinician: Referring Provider:  Treating Provider/Extender: Melina Fiddler Weeks in Treatment: 1 Constitutional respirations regular, non-labored and within target range for patient.. Cardiovascular 2+ dorsalis pedis/posterior tibialis pulses. Psychiatric pleasant and cooperative. Notes Left upper extremity: Epithelialization to previous wound site. Right lower extremity: Posterior aspect with open wound and granulation tissue and scant nonviable tissue present. T the upper anterior portion there are 2 o small wounds limited to skin breakdown. Left lower extremity: Anterior aspect there is a small open wound limited to skin breakdown. Electronic Signature(s) Signed: 01/19/2021 9:27:39 AM By: Kalman Shan DO Entered By: Kalman Shan on 01/19/2021 09:24:45 -------------------------------------------------------------------------------- Physician Orders Details Patient Name: Date of Service: [name], [name] 01/19/2021 8:00 A M Medical Record Number: [id number] Patient Account Number: [id number] Date of Birth/Sex: Treating RN: 10-19-1940 (80 y.o. [name] Primary Care Provider: Roma Schanz Other Clinician: Referring Provider: Treating Provider/Extender: Tharon Aquas in Treatment: 1 Verbal / Phone Orders: No Diagnosis Coding ICD-10 Coding Code Description S81.801D Unspecified open wound, right lower leg, subsequent encounter S81.802D Unspecified open wound, left lower leg, subsequent encounter S40.812D Abrasion of left upper  arm, subsequent encounter Follow-up Appointments Return Appointment in 1 week. Bathing/ Shower/ Hygiene May shower with protection but do not get wound dressing(s) wet. Edema Control - Lymphedema / SCD / Other Bilateral Lower Extremities Elevate legs to the level of the heart or above for 30 minutes daily and/or when sitting, a frequency of: - throughout the day Avoid standing for long periods of time. Exercise regularly Wound Treatment Wound #1 - Lower Leg Wound Laterality: Left, Lateral Topical: Triple Antibiotic Ointment, 1 (oz) Tube Every Other Day/30 Days Secondary Dressing: Zetuvit Plus Silicone Border Dressing 5x5 (in/in) (Generic) Every Other Day/30 Days Discharge Instructions: Apply silicone border over primary dressing as directed. Wound #2 - Lower Leg Wound Laterality: Right, Posterior Peri-Wound Care: Sween Lotion (Moisturizing lotion) 1 x Per Week/15 Days Discharge Instructions: Apply moisturizing lotion as directed Prim Dressing: Hydrofera Blue Ready Foam, 2.5 x2.5 in 1 x Per Week/15 Days ary Discharge Instructions: Apply to wound bed as instructed Prim Dressing: Santyl Ointment 1 x Per Week/15 Days ary Discharge Instructions: Apply nickel thick amount to wound bed as instructed Secondary Dressing: ABD Pad, 5x9 1 x Per Week/15 Days Discharge Instructions: Apply over primary dressing as directed. Compression Wrap: Kerlix Roll 4.5x3.1 (in/yd) 1 x Per Week/15 Days Discharge Instructions: Apply Kerlix and Coban compression as directed. Compression Wrap: Coban Self-Adherent Wrap 4x5 (in/yd) 1 x Per Week/15 Days Discharge Instructions: Apply over Kerlix as directed. Wound #3 - Lower Leg Wound Laterality: Right, Lateral Peri-Wound Care: Sween Lotion (Moisturizing lotion) Discharge Instructions: Apply moisturizing lotion as directed Prim Dressing: KerraCel Ag Gelling Fiber Dressing, 2x2 in (silver alginate) ary Discharge Instructions: Apply silver alginate to wound bed as  instructed Secondary Dressing: Woven Gauze Sponge, Non-Sterile 4x4 in Discharge Instructions: Apply over primary dressing as directed. Compression Wrap: Kerlix Roll 4.5x3.1 (in/yd) Discharge Instructions: Apply Kerlix and Coban compression as directed. Compression Wrap: Coban Self-Adherent Wrap 4x5 (in/yd) Discharge Instructions: Apply over Kerlix as directed. Electronic Signature(s) Signed: 01/19/2021 9:27:39 AM By: Kalman Shan DO Entered By: Kalman Shan on 01/19/2021 09:25:25 -------------------------------------------------------------------------------- Problem List Details Patient Name: Date of Service: [name], [name] 01/19/2021 8:00 A M Medical Record Number: [id number] Patient Account Number: [id number] Date of Birth/Sex: Treating RN: [birth year] (80 y.o. [name] Primary Care Provider: Roma Schanz Other Clinician: Referring Provider: Treating Provider/Extender: Tharon Aquas in Treatment: 1 Active Problems  ICD-10 Encounter Code Description Active Date MDM Diagnosis S81.801D Unspecified open wound, right lower leg, subsequent encounter 01/19/2021 No Yes S81.802D Unspecified open wound, left lower leg, subsequent encounter 01/19/2021 No Yes S40.812D Abrasion of left upper arm, subsequent encounter 01/19/2021 No Yes Inactive Problems ICD-10 Code Description Active Date Inactive Date S81.802A Unspecified open wound, left lower leg, initial encounter 01/12/2021 01/12/2021 S81.801A Unspecified open wound, right lower leg, initial encounter 01/12/2021 01/12/2021 S40.812A Abrasion of left upper arm, initial encounter 01/12/2021 01/12/2021 Resolved Problems Electronic Signature(s) Signed: 01/19/2021 9:27:39 AM By: Kalman Shan DO Entered By: Kalman Shan on 01/19/2021 09:22:28 -------------------------------------------------------------------------------- Progress Note Details Patient Name: Date of Service: [name],  [name] 01/19/2021 8:00 A M Medical Record Number: [id number] Patient Account Number: [id number] Date of Birth/Sex: Treating RN: 20-Jul-1940 (80 y.o. [name] Primary Care Provider: Roma Schanz Other Clinician: Referring Provider: Treating Provider/Extender: Tharon Aquas in Treatment: 1 Subjective Chief Complaint Information obtained from Patient Wounds to his left upper extremity and bilateral lower extremities. History of Present Illness (HPI) Admission 7/15 Mr. Robin Denham is an 80 year old male with a past medical history of type 2 diabetes on oral agents, diffuse large B-cell lymphoma, and coronary artery disease status post CABG that presents to the clinic for wounds to his left upper extremity and bilateral lower extremities. He states that he went to urgent care to be evaluated for a fever. And he was instructed to go to the ED to be further evaluated for possible sepsis. Unfortunately he passed out in his car After his appointment and was not discovered for another 3 hours. He was eventually discovered and EMS had to take him out of his vehicle. This is when he developed abrasions to his left arm and legs. He has been using Xeroform to his wound beds every other day. He reports improvement to all the wounds except for the one on the back of his right leg. He denies pain or signs of infection. 7/22; patient presents for 1 week follow-up. He has no issues or complaints today. He has tolerated the compression wrap well on his right lower extremity. He has been able to do the dressing changes without issues to his left leg and left arm. His left arm wound has healed. He denies signs of infection. Patient History Information obtained from Patient. Family History Cancer - Mother, Diabetes - Maternal Grandparents, Heart Disease - Paternal Grandparents, Hypertension - Paternal Grandparents, Seizures - Child, Stroke - Father, No family  history of Hereditary Spherocytosis, Kidney Disease, Lung Disease, Thyroid Problems, Tuberculosis. Social History Never smoker, Marital Status - Divorced, Alcohol Use - Never, Drug Use - No History, Caffeine Use - Rarely. Medical History Eyes Patient has history of Cataracts - Surgery Hematologic/Lymphatic Patient has history of Anemia Respiratory Patient has history of Sleep Apnea Cardiovascular Patient has history of Arrhythmia, Congestive Heart Failure, Coronary Artery Disease, Hypertension Endocrine Patient has history of Type II Diabetes Musculoskeletal Patient has history of Osteoarthritis Neurologic Patient has history of Neuropathy Oncologic Patient has history of Received Chemotherapy Denies history of Received Radiation Medical A Surgical History Notes nd Musculoskeletal Spinal Stenosis Oncologic Large B Cell Lymphoma Objective Constitutional respirations regular, non-labored and within target range for patient.. Vitals Time Taken: 7:51 AM, Height: 72 in, Source: Stated, Weight: 235 lbs, Source: Stated, BMI: 31.9, Temperature: 97.9 F, Pulse: 75 bpm, Respiratory Rate: 18 breaths/min, Blood Pressure: 118/65 mmHg, Capillary Blood Glucose: 102 mg/dl. General Notes: glucose per pt report this am Cardiovascular 2+  dorsalis pedis/posterior tibialis pulses. Psychiatric pleasant and cooperative. General Notes: Left upper extremity: Epithelialization to previous wound site. Right lower extremity: Posterior aspect with open wound and granulation tissue and scant nonviable tissue present. T the upper anterior portion there are 2 small wounds limited to skin breakdown. Left lower extremity: Anterior aspect there o is a small open wound limited to skin breakdown. Integumentary (Hair, Skin) Wound #1 status is Open. Original cause of wound was Trauma. The date acquired was: 12/24/2020. The wound has been in treatment 1 weeks. The wound is located on the Left,Lateral Lower Leg.  The wound measures 0.3cm length x 0.2cm width x 0.1cm depth; 0.047cm^2 area and 0.005cm^3 volume. There is Fat Layer (Subcutaneous Tissue) exposed. There is no tunneling or undermining noted. There is a small amount of serosanguineous drainage noted. The wound margin is distinct with the outline attached to the wound base. There is large (67-100%) red granulation within the wound bed. There is no necrotic tissue within the wound bed. Wound #2 status is Open. Original cause of wound was Trauma. The date acquired was: 12/24/2020. The wound has been in treatment 1 weeks. The wound is located on the Right,Posterior Lower Leg. The wound measures 4.4cm length x 4.1cm width x 0.1cm depth; 14.169cm^2 area and 1.417cm^3 volume. There is Fat Layer (Subcutaneous Tissue) exposed. There is no tunneling or undermining noted. There is a medium amount of serosanguineous drainage noted. The wound margin is distinct with the outline attached to the wound base. There is small (1-33%) red granulation within the wound bed. There is a large (67-100%) amount of necrotic tissue within the wound bed including Adherent Slough. Wound #3 status is Open. Original cause of wound was Trauma. The date acquired was: 12/24/2020. The wound has been in treatment 1 weeks. The wound is located on the Right,Lateral Lower Leg. The wound measures 1cm length x 1.5cm width x 0.1cm depth; 1.178cm^2 area and 0.118cm^3 volume. There is Fat Layer (Subcutaneous Tissue) exposed. There is no tunneling or undermining noted. There is a small amount of serosanguineous drainage noted. The wound margin is flat and intact. There is large (67-100%) red granulation within the wound bed. There is no necrotic tissue within the wound bed. Wound #4 status is Healed - Epithelialized. Original cause of wound was Trauma. The date acquired was: 12/24/2020. The wound has been in treatment 1 weeks. The wound is located on the Left,Lateral Forearm. The wound measures 0cm  length x 0cm width x 0cm depth; 0cm^2 area and 0cm^3 volume. There is no tunneling or undermining noted. There is a none present amount of drainage noted. There is no granulation within the wound bed. There is no necrotic tissue within the wound bed. Assessment Active Problems ICD-10 Unspecified open wound, right lower leg, subsequent encounter Unspecified open wound, left lower leg, subsequent encounter Abrasion of left upper arm, subsequent encounter In the past week patient has done very well with healing. His left arm wound is healed and his left lower extremity wounds are almost healed. I recommended using antibiotic ointment and keeping the area covered to his left lower extremity. His right lower extremity has improved greatly in size and appearance. I recommended the same treatment of Hydrofera Blue and Santyl to the posterior wound and silver alginate to the anterior leg wound under Kerlix/Coban. I suspect he will only need 1-2 more weeks of treatment before he is completely healed. Procedures Wound #2 Pre-procedure diagnosis of Wound #2 is a Trauma, Other located on the White  Lower Leg . There was a Chemical/Enzymatic/Mechanical debridement performed by Baruch Gouty, RN.[name] Agent used was [employer]. There was no bleeding. The procedure was tolerated well. Post Debridement Measurements: 4.4cm length x 4.1cm width x 0.1cm depth; 1.417cm^3 volume. Character of Wound/Ulcer Post Debridement requires further debridement. Post procedure Diagnosis Wound #2: Same as Pre-Procedure Plan Follow-up Appointments: Return Appointment in 1 week. Bathing/ Shower/ Hygiene: May shower with protection but do not get wound dressing(s) wet. Edema Control - Lymphedema / SCD / Other: Elevate legs to the level of the heart or above for 30 minutes daily and/or when sitting, a frequency of: - throughout the day Avoid standing for long periods of time. Exercise regularly WOUND #1: - Lower Leg Wound  Laterality: Left, Lateral Topical: Triple Antibiotic Ointment, 1 (oz) Tube Every Other Day/30 Days Secondary Dressing: Zetuvit Plus Silicone Border Dressing 5x5 (in/in) (Generic) Every Other Day/30 Days Discharge Instructions: Apply silicone border over primary dressing as directed. WOUND #2: - Lower Leg Wound Laterality: Right, Posterior Peri-Wound Care: Sween Lotion (Moisturizing lotion) 1 x Per Week/15 Days Discharge Instructions: Apply moisturizing lotion as directed Prim Dressing: Hydrofera Blue Ready Foam, 2.5 x2.5 in 1 x Per Week/15 Days ary Discharge Instructions: Apply to wound bed as instructed Prim Dressing: Santyl Ointment 1 x Per Week/15 Days ary Discharge Instructions: Apply nickel thick amount to wound bed as instructed Secondary Dressing: ABD Pad, 5x9 1 x Per Week/15 Days Discharge Instructions: Apply over primary dressing as directed. Com pression Wrap: Kerlix Roll 4.5x3.1 (in/yd) 1 x Per Week/15 Days Discharge Instructions: Apply Kerlix and Coban compression as directed. Com pression Wrap: Coban Self-Adherent Wrap 4x5 (in/yd) 1 x Per Week/15 Days Discharge Instructions: Apply over Kerlix as directed. WOUND #3: - Lower Leg Wound Laterality: Right, Lateral Peri-Wound Care: Sween Lotion (Moisturizing lotion) Discharge Instructions: Apply moisturizing lotion as directed Prim Dressing: KerraCel Ag Gelling Fiber Dressing, 2x2 in (silver alginate) ary Discharge Instructions: Apply silver alginate to wound bed as instructed Secondary Dressing: Woven Gauze Sponge, Non-Sterile 4x4 in Discharge Instructions: Apply over primary dressing as directed. Com pression Wrap: Kerlix Roll 4.5x3.1 (in/yd) Discharge Instructions: Apply Kerlix and Coban compression as directed. Com pression Wrap: Coban Self-Adherent Wrap 4x5 (in/yd) Discharge Instructions: Apply over Kerlix as directed. 1. Antibiotic ointment to the left lower extremity and keeping areas of skin breakdown covered 2.  Hydrofera Blue and Santyl to the posterior right leg wound and silver alginate to the anterior right leg wound under Kerlix/Coban 3. Follow-up in 1 week Electronic Signature(s) Signed: 01/19/2021 9:27:39 AM By: Kalman Shan DO Entered By: Kalman Shan on 01/19/2021 09:26:50 -------------------------------------------------------------------------------- HxROS Details Patient Name: Date of Service: [name], [name] 01/19/2021 8:00 A M Medical Record Number: [id number] Patient Account Number: [id number] Date of Birth/Sex: Treating RN: 13-Feb-1941 (80 y.o. [name] Primary Care Provider: Roma Schanz Other Clinician: Referring Provider: Treating Provider/Extender: Tharon Aquas in Treatment: 1 Information Obtained From Patient Eyes Medical History: Positive for: Cataracts - Surgery Hematologic/Lymphatic Medical History: Positive for: Anemia Respiratory Medical History: Positive for: Sleep Apnea Cardiovascular Medical History: Positive for: Arrhythmia; Congestive Heart Failure; Coronary Artery Disease; Hypertension Endocrine Medical History: Positive for: Type II Diabetes Time with diabetes: 20 years Treated with: Oral agents Blood sugar tested every day: Yes Tested : Q am Musculoskeletal Medical History: Positive for: Osteoarthritis Past Medical History Notes: Spinal Stenosis Neurologic Medical History: Positive for: Neuropathy Oncologic Medical History: Positive for: Received Chemotherapy Negative for: Received Radiation Past Medical History Notes: Large B Cell  Lymphoma HBO Extended History Items Eyes: Cataracts Immunizations Pneumococcal Vaccine: Received Pneumococcal Vaccination: Yes Implantable Devices Yes Family and Social History Cancer: Yes - Mother; Diabetes: Yes - Maternal Grandparents; Heart Disease: Yes - Paternal Grandparents; Hereditary Spherocytosis: No; Hypertension: Yes - Paternal  Grandparents; Kidney Disease: No; Lung Disease: No; Seizures: Yes - Child; Stroke: Yes - Father; Thyroid Problems: No; Tuberculosis: No; Never smoker; Marital Status - Divorced; Alcohol Use: Never; Drug Use: No History; Caffeine Use: Rarely; Financial Concerns: No; Food, Clothing or Shelter Needs: No; Support System Lacking: No; Transportation Concerns: No Electronic Signature(s) Signed: 01/19/2021 9:27:39 AM By: Kalman Shan DO Signed: 01/19/2021 1:37:28 PM By: Baruch Gouty RN, BSN Entered By: Kalman Shan on 01/19/2021 09:23:48 -------------------------------------------------------------------------------- [place]der Details Patient Name: Date of Service: [name] 01/19/2021 Medical Record Number: [id number] Patient Account Number: [id number] Date of Birth/Sex: Treating RN: [birth year] (80 y.o. [name] Primary Care Provider: Roma Schanz Other Clinician: Referring Provider: Treating Provider/Extender: Tharon Aquas in Treatment: 1 Diagnosis Coding ICD-10 Codes Code Description [phone] Unspecified open wound, right lower leg, subsequent encounter S81.802D Unspecified open wound, left lower leg, subsequent encounter S40.812D Abrasion of left upper arm, subsequent encounter Facility Procedures CPT4 Code: [id number] Description: [phone] - DEBRIDE W/O ANES NON SELECT Modifier: Quantity: 1 Physician Procedures : CPT4 Code Description Modifier [id number] - WC PHYS LEVEL 3 - EST PT ICD-10 Diagnosis Description S81.801D Unspecified open wound, right lower leg, subsequent encounter S81.802D Unspecified open wound, left lower leg, subsequent encounter S40.812D  Abrasion of left upper arm, subsequent encounter Quantity: 1 Electronic Signature(s) Signed: 01/19/2021 9:27:39 AM By: Kalman Shan DO Entered By: Kalman Shan on 01/19/2021 09:27:14

## 2021-01-19 NOTE — Progress Notes (Signed)
Richard [name] ([id number]) Visit Report for 01/19/2021 Arrival Information Details Patient Name: Date of Service: Richard [name] Richard L. 01/19/2021 8:00 A M Medical Record Number: [id number] Patient Account Number: [id number] Date of Birth/Sex: Treating RN: 1941/05/13 (80 y.o. [name] Primary Care Nakenya Theall: Roma Schanz Other Clinician: Referring Gillian Meeuwsen: Treating Temica Righetti/Extender: Tharon Aquas in Treatment: 1 Visit Information History Since Last Visit Added or deleted any medications: No Patient Arrived: Ambulatory Any new allergies or adverse reactions: No Arrival Time: 07:50 Had a fall or experienced change in No Accompanied By: self activities of daily living that may affect Transfer Assistance: None risk of falls: Patient Identification Verified: Yes Signs or symptoms of abuse/neglect since last visito No Secondary Verification Process Completed: Yes Hospitalized since last visit: No Patient Requires Transmission-Based Precautions: No Implantable device outside of the clinic excluding No Patient Has Alerts: Yes cellular tissue based products placed in the center Patient Alerts: Patient on Blood Thinner since last visit: Has Dressing in Place as Prescribed: Yes Has Compression in Place as Prescribed: Yes Pain Present Now: No Electronic Signature(s) Signed: 01/19/2021 1:37:28 PM By: Baruch Gouty RN, BSN Entered By: Baruch Gouty on 01/19/2021 07:51:13 -------------------------------------------------------------------------------- Encounter Discharge Information Details Patient Name: Date of Service: Richard [name], Richard [name] Richard L. 01/19/2021 8:00 A M Medical Record Number: [id number] Patient Account Number: [id number] Date of Birth/Sex: Treating RN: 21-Aug-1940 (80 y.o. Marcheta Grammes Primary Care Noelle Hoogland: Roma Schanz Other Clinician: Referring Javarie Crisp: Treating Sarahanne Novakowski/Extender: Tharon Aquas in Treatment: 1 Encounter Discharge Information Items Discharge Condition: Stable Ambulatory Status: Ambulatory Discharge Destination: Home Transportation: Private Auto Schedule Follow-up Appointment: Yes Clinical Summary of Care: Provided on 01/19/2021 Form Type Recipient Paper Patient Patient Electronic Signature(s) Signed: 01/19/2021 8:57:28 AM By: Lorrin Jackson Entered By: Lorrin Jackson on 01/19/2021 08:57:27 -------------------------------------------------------------------------------- Lower Extremity Assessment Details Patient Name: Date of Service: Richard [name], Richard [name] Richard L. 01/19/2021 8:00 A M Medical Record Number: [id number] Patient Account Number: [id number] Date of Birth/Sex: Treating RN: Oct 08, 1940 (80 y.o. [name] Primary Care Leary Mcnulty: Roma Schanz Other Clinician: Referring Yarissa Reining: Treating [place] Agostinelli/Extender: Tharon Aquas in Treatment: 1 Edema Assessment Assessed: [Left: No] [Right: No] Edema: [Left: Ye] [Right: s] Calf Left: Right: Point of Measurement: 30 cm From Medial Instep 38.4 cm Ankle Left: Right: Point of Measurement: 9 cm From Medial Instep 24.3 cm Vascular Assessment Pulses: Dorsalis Pedis Palpable: [Right:Yes] Electronic Signature(s) Signed: 01/19/2021 1:37:28 PM By: Baruch Gouty RN, BSN Entered By: Baruch Gouty on 01/19/2021 08:06:45 -------------------------------------------------------------------------------- Multi Wound Chart Details Patient Name: Date of Service: Richard [name], Richard [name] Richard L. 01/19/2021 8:00 A M Medical Record Number: [id number] Patient Account Number: [id number] Date of Birth/Sex: Treating RN: 02/04/1941 (80 y.o. [name] Primary Care Hazyl Marseille: Roma Schanz Other Clinician: Referring Angelys Yetman: Treating Mckennon Zwart/Extender: Tharon Aquas in Treatment: 1 Vital Signs Height(in): 72 Capillary Blood Glucose(mg/dl):  102 Weight(lbs): 235 Pulse(bpm): 38 Body Mass Index(BMI): 32 Blood Pressure(mmHg): 118/65 Temperature(F): 97.9 Respiratory Rate(breaths/min): 18 Photos: [1:Left, Lateral Lower Leg] [2:Right, Posterior Lower Leg] [3:Right, Lateral Lower Leg] Wound Location: [1:Trauma] [2:Trauma] [3:Trauma] Wounding Event: [1:Trauma, Other] [2:Trauma, Other] [3:Trauma, Other] Primary Etiology: [1:Cataracts, Anemia, Sleep Apnea,] [2:Cataracts, Anemia, Sleep Apnea,] [3:Cataracts, Anemia, Sleep Apnea,] Comorbid History: [1:Arrhythmia, Congestive Heart Failure, Arrhythmia, Congestive Heart Failure, Arrhythmia, Congestive Heart Failure, Coronary Artery Disease, Hypertension, Type II Diabetes, Osteoarthritis, Neuropathy, Received Osteoarthritis,  Neuropathy, Received Osteoarthritis, Neuropathy, Received Chemotherapy 12/24/2020] [2:Coronary Artery Disease, Hypertension, Type II Diabetes, Chemotherapy 12/24/2020] [3:Coronary Artery Disease, Hypertension,  Type II Diabetes, Chemotherapy 12/24/2020] Date Acquired: [1:1] [2:1] [3:1] Weeks of Treatment: [1:Open] [2:Open] [3:Open] Wound Status: [1:0.3x0.2x0.1] [2:4.4x4.1x0.1] [3:1x1.5x0.1] Measurements L x W x D (cm) [1:0.047] [2:14.169] [3:1.178] A (cm) : rea [1:0.005] [2:1.417] [3:0.118] Volume (cm) : [1:99.90%] [2:-3.10%] [3:21.90%] % Reduction in A [1:rea: 99.90%] [2:48.50%] [3:21.90%] % Reduction in Volume: [1:Full Thickness Without Exposed] [2:Full Thickness Without Exposed] [3:Full Thickness Without Exposed] Classification: [1:Support Structures Small] [2:Support Structures Medium] [3:Support Structures Small] Exudate A mount: [1:Serosanguineous] [2:Serosanguineous] [3:Serosanguineous] Exudate Type: [1:red, brown] [2:red, brown] [3:red, brown] Exudate Color: [1:Distinct, outline attached] [2:Distinct, outline attached] [3:Flat and Intact] Wound Margin: [1:Large (67-100%)] [2:Small (1-33%)] [3:Large (67-100%)] Granulation A mount: [1:Red] [2:Red]  [3:Red] Granulation Quality: [1:None Present (0%)] [2:Large (67-100%)] [3:None Present (0%)] Necrotic A mount: [1:Fat Layer (Subcutaneous Tissue): Yes Fat Layer (Subcutaneous Tissue): Yes Fat Layer (Subcutaneous Tissue): Yes] Exposed Structures: [1:Fascia: No Tendon: No Muscle: No Joint: No Bone: No Large (67-100%)] [2:Fascia: No Tendon: No Muscle: No Joint: No Bone: No Small (1-33%)] [3:Fascia: No Tendon: No Muscle: No Joint: No Bone: No Small (1-33%)] Epithelialization: [1:N/A] [2:Chemical/Enzymatic/Mechanical] [3:N/A] Debridement: [1:N/A] [2:N/A] [3:N/A] Instrument: [1:N/A] [2:None] [3:N/A] Bleeding: Debridement Treatment Response: N/A [2:Procedure was tolerated well] [3:N/A] Post Debridement Measurements L x N/A [2:4.4x4.1x0.1] [3:N/A] W x D (cm) [1:N/A] [2:1.417] [3:N/A] Post Debridement Volume: (cm) [1:N/A] [2:Debridement] [3:N/A] Wound Number: 4 N/A N/A Photos: No Photos N/A N/A Left, Lateral Forearm N/A N/A Wound Location: Trauma N/A N/A Wounding Event: Trauma, Other N/A N/A Primary Etiology: Cataracts, Anemia, Sleep Apnea, N/A N/A Comorbid History: Arrhythmia, Congestive Heart Failure, Coronary Artery Disease, Hypertension, Type II Diabetes, Osteoarthritis, Neuropathy, Received Chemotherapy 12/24/2020 N/A N/A Date Acquired: 1 N/A N/A Weeks of Treatment: Healed - Epithelialized N/A N/A Wound Status: 0x0x0 N/A N/A Measurements L x W x D (cm) 0 N/A N/A A (cm) : rea 0 N/A N/A Volume (cm) : 100.00% N/A N/A % Reduction in A rea: 100.00% N/A N/A % Reduction in Volume: Full Thickness Without Exposed N/A N/A Classification: Support Structures None Present N/A N/A Exudate A mount: N/A N/A N/A Exudate Type: N/A N/A N/A Exudate Color: N/A N/A N/A Wound Margin: None Present (0%) N/A N/A Granulation A mount: N/A N/A N/A Granulation Quality: None Present (0%) N/A N/A Necrotic A mount: Fascia: No N/A N/A Exposed Structures: Fat Layer (Subcutaneous Tissue):  No Tendon: No Muscle: No Joint: No Bone: No Large (67-100%) N/A N/A Epithelialization: N/A N/A N/A Debridement: N/A N/A N/A Instrument: N/A N/A N/A Bleeding: Debridement Treatment Response: N/A N/A N/A Post Debridement Measurements L x N/A N/A N/A W x D (cm) N/A N/A N/A Post Debridement Volume: (cm) N/A N/A N/A Procedures Performed: Treatment Notes Wound #1 (Lower Leg) Wound Laterality: Left, Lateral Cleanser Peri-Wound Care Topical Triple Antibiotic Ointment, 1 (oz) Tube Primary Dressing Secondary Dressing Zetuvit Plus Silicone Border Dressing 5x5 (in/in) Discharge Instruction: Apply silicone border over primary dressing as directed. Secured With Compression Wrap Compression Stockings Add-Ons Wound #2 (Lower Leg) Wound Laterality: Right, Posterior Cleanser Peri-Wound Care Sween Lotion (Moisturizing lotion) Discharge Instruction: Apply moisturizing lotion as directed Topical Primary Dressing Hydrofera Blue Ready Foam, 2.5 x2.5 in Discharge Instruction: Apply to wound bed as instructed Santyl Ointment Discharge Instruction: Apply nickel thick amount to wound bed as instructed Secondary Dressing ABD Pad, 5x9 Discharge Instruction: Apply over primary dressing as directed. Secured With Compression Wrap Kerlix Roll 4.5x3.1 (in/yd) Discharge Instruction: Apply Kerlix and Coban compression as directed. Coban Self-Adherent Wrap 4x5 (in/yd) Discharge Instruction: Apply over Kerlix as directed. Compression Stockings Add-Ons  Wound #3 (Lower Leg) Wound Laterality: Right, Lateral Cleanser Peri-Wound Care Sween Lotion (Moisturizing lotion) Discharge Instruction: Apply moisturizing lotion as directed Topical Primary Dressing KerraCel Ag Gelling Fiber Dressing, 2x2 in (silver alginate) Discharge Instruction: Apply silver alginate to wound bed as instructed Secondary Dressing Woven Gauze Sponge, Non-Sterile 4x4 in Discharge Instruction: Apply over primary dressing  as directed. Secured With Compression Wrap Kerlix Roll 4.5x3.1 (in/yd) Discharge Instruction: Apply Kerlix and Coban compression as directed. Coban Self-Adherent Wrap 4x5 (in/yd) Discharge Instruction: Apply over Kerlix as directed. Compression Stockings Add-Ons Electronic Signature(s) Signed: 01/19/2021 9:27:39 AM By: Kalman Shan DO Signed: 01/19/2021 1:37:28 PM By: Baruch Gouty RN, BSN Entered By: Kalman Shan on 01/19/2021 09:22:58 -------------------------------------------------------------------------------- Multi-Disciplinary Care Plan Details Patient Name: Date of Service: Richard [name], Richard [name] Richard L. 01/19/2021 8:00 A M Medical Record Number: [id number] Patient Account Number: [id number] Date of Birth/Sex: Treating RN: 1941/03/30 (80 y.o. [name] Primary Care Amberle Lyter: Roma Schanz Other Clinician: Referring Oneka Parada: Treating Heaven Wandell/Extender: Tharon Aquas in Treatment: 1 [place] reviewed with physician Active Inactive Wound/Skin Impairment Nursing Diagnoses: Impaired tissue integrity Knowledge deficit related to ulceration/compromised skin integrity Goals: Patient/caregiver will verbalize understanding of skin care regimen Date Initiated: 01/12/2021 Target Resolution Date: 02/09/2021 Goal Status: Active Ulcer/skin breakdown will have a volume reduction of 30% by week 4 Date Initiated: 01/12/2021 Target Resolution Date: 02/09/2021 Goal Status: Active Interventions: Assess patient/caregiver ability to obtain necessary supplies Assess patient/caregiver ability to perform ulcer/skin care regimen upon admission and as needed Assess ulceration(s) every visit Provide education on ulcer and skin care Treatment Activities: Skin care regimen initiated : 01/12/2021 Topical wound management initiated : 01/12/2021 Notes: Electronic Signature(s) Signed: 01/19/2021 1:37:28 PM By: Baruch Gouty RN,  BSN Entered By: Baruch Gouty on 01/19/2021 08:09:30 -------------------------------------------------------------------------------- Pain Assessment Details Patient Name: Date of Service: Richard [name], Richard [name] Richard L. 01/19/2021 8:00 A M Medical Record Number: [id number] Patient Account Number: [id number] Date of Birth/Sex: Treating RN: 01/04/1941 (80 y.o. [name] Primary Care Nani Ingram: Roma Schanz Other Clinician: Referring Detra Bores: Treating Delainey Winstanley/Extender: Tharon Aquas in Treatment: 1 Active Problems Location of Pain Severity and Description of Pain Patient Has Paino No Site Locations Rate the pain. Current Pain Level: 0 Pain Management and Medication Current Pain Management: Electronic Signature(s) Signed: 01/19/2021 1:37:28 PM By: Baruch Gouty RN, BSN Entered By: Baruch Gouty on 01/19/2021 08:05:53 -------------------------------------------------------------------------------- Patient/Caregiver Education Details Patient Name: Date of Service: Richard [name] 7/22/2022andnbsp8:00 A M Medical Record Number: [id number] Patient Account Number: [id number] Date of Birth/Gender: Treating RN: March 13, 1941 (80 y.o. [name] Primary Care Physician: Roma Schanz Other Clinician: Referring Physician: Treating Physician/Extender: Tharon Aquas in Treatment: 1 Education Assessment Education Provided To: Patient Education Topics Provided Wound/Skin Impairment: Methods: Explain/Verbal Responses: Reinforcements needed, State content correctly Electronic Signature(s) Signed: 01/19/2021 1:37:28 PM By: Baruch Gouty RN, BSN Entered By: Baruch Gouty on 01/19/2021 08:09:57 -------------------------------------------------------------------------------- Wound Assessment Details Patient Name: Date of Service: Richard [name], Richard [name] Richard L. 01/19/2021 8:00 A M Medical Record Number:  [id number] Patient Account Number: [id number] Date of Birth/Sex: Treating RN: Jun 25, 1941 (80 y.o. [name] Primary Care Jeremie Giangrande: Roma Schanz Other Clinician: Referring Gracyn Allor: Treating Stepheni Cameron/Extender: Tharon Aquas in Treatment: 1 Wound Status Wound Number: 1 Primary Trauma, Other Etiology: Wound Location: Left, Lateral Lower Leg Wound Open Wounding Event: Trauma Status: Date Acquired: 12/24/2020 Comorbid Cataracts, Anemia, Sleep Apnea, Arrhythmia, Congestive Heart Weeks Of Treatment: 1 History: Failure, Coronary Artery Disease, Hypertension,  Type II Diabetes, Clustered Wound: No Osteoarthritis, Neuropathy, Received Chemotherapy Photos Wound Measurements Length: (cm) 0.3 Width: (cm) 0.2 Depth: (cm) 0.1 Area: (cm) 0.047 Volume: (cm) 0.005 % Reduction in Area: 99.9% % Reduction in Volume: 99.9% Epithelialization: Large (67-100%) Tunneling: No Undermining: No Wound Description Classification: Full Thickness Without Exposed Support Structures Wound Margin: Distinct, outline attached Exudate Amount: Small Exudate Type: Serosanguineous Exudate Color: red, brown Foul Odor After Cleansing: No Slough/Fibrino No Wound Bed Granulation Amount: Large (67-100%) Exposed Structure Granulation Quality: Red Fascia Exposed: No Necrotic Amount: None Present (0%) Fat Layer (Subcutaneous Tissue) Exposed: Yes Tendon Exposed: No Muscle Exposed: No Joint Exposed: No Bone Exposed: No Treatment Notes Wound #1 (Lower Leg) Wound Laterality: Left, Lateral Cleanser Peri-Wound Care Topical Triple Antibiotic Ointment, 1 (oz) Tube Primary Dressing Secondary Dressing Zetuvit Plus Silicone Border Dressing 5x5 (in/in) Discharge Instruction: Apply silicone border over primary dressing as directed. Secured With Compression Wrap Compression Stockings Add-Ons Electronic Signature(s) Signed: 01/19/2021 10:49:45 AM By: Sandre Kitty Signed: 01/19/2021 1:37:28 PM By: Baruch Gouty RN, BSN Entered By: Sandre Kitty on 01/19/2021 08:13:21 -------------------------------------------------------------------------------- Wound Assessment Details Patient Name: Date of Service: Richard [name], Richard [name] Richard L. 01/19/2021 8:00 A M Medical Record Number: [id number] Patient Account Number: [id number] Date of Birth/Sex: Treating RN: 07-27-1940 (80 y.o. [name] Primary Care Kashayla Ungerer: Roma Schanz Other Clinician: Referring Aubry Tucholski: Treating Adalin Vanderploeg/Extender: Tharon Aquas in Treatment: 1 Wound Status Wound Number: 2 Primary Trauma, Other Etiology: Wound Location: Right, Posterior Lower Leg Wound Open Wounding Event: Trauma Status: Date Acquired: 12/24/2020 Comorbid Cataracts, Anemia, Sleep Apnea, Arrhythmia, Congestive Heart Weeks Of Treatment: 1 History: Failure, Coronary Artery Disease, Hypertension, Type II Diabetes, Clustered Wound: No Osteoarthritis, Neuropathy, Received Chemotherapy Photos Wound Measurements Length: (cm) 4.4 Width: (cm) 4.1 Depth: (cm) 0.1 Area: (cm) 14.169 Volume: (cm) 1.417 % Reduction in Area: -3.1% % Reduction in Volume: 48.5% Epithelialization: Small (1-33%) Tunneling: No Undermining: No Wound Description Classification: Full Thickness Without Exposed Support Structures Wound Margin: Distinct, outline attached Exudate Amount: Medium Exudate Type: Serosanguineous Exudate Color: red, brown Wound Bed Granulation Amount: Small (1-33%) Granulation Quality: Red Necrotic Amount: Large (67-100%) Necrotic Quality: Adherent Slough Foul Odor After Cleansing: No Slough/Fibrino Yes Exposed Structure Fascia Exposed: No Fat Layer (Subcutaneous Tissue) Exposed: Yes Tendon Exposed: No Muscle Exposed: No Joint Exposed: No Bone Exposed: No Treatment Notes Wound #2 (Lower Leg) Wound Laterality: Right, Posterior Cleanser Peri-Wound  Care Sween Lotion (Moisturizing lotion) Discharge Instruction: Apply moisturizing lotion as directed Topical Primary Dressing Hydrofera Blue Ready Foam, 2.5 x2.5 in Discharge Instruction: Apply to wound bed as instructed Santyl Ointment Discharge Instruction: Apply nickel thick amount to wound bed as instructed Secondary Dressing ABD Pad, 5x9 Discharge Instruction: Apply over primary dressing as directed. Secured With Compression Wrap Kerlix Roll 4.5x3.1 (in/yd) Discharge Instruction: Apply Kerlix and Coban compression as directed. Coban Self-Adherent Wrap 4x5 (in/yd) Discharge Instruction: Apply over Kerlix as directed. Compression Stockings Add-Ons Electronic Signature(s) Signed: 01/19/2021 10:49:45 AM By: Sandre Kitty Signed: 01/19/2021 1:37:28 PM By: Baruch Gouty RN, BSN Entered By: Sandre Kitty on 01/19/2021 08:13:59 -------------------------------------------------------------------------------- Wound Assessment Details Patient Name: Date of Service: Richard [name], Richard [name] Richard L. 01/19/2021 8:00 A M Medical Record Number: [id number] Patient Account Number: [id number] Date of Birth/Sex: Treating RN: 1941-04-03 (80 y.o. [name] Primary Care Eliese Kerwood: Roma Schanz Other Clinician: Referring Zacchary Pompei: Treating Khali Albanese/Extender: Tharon Aquas in Treatment: 1 Wound Status Wound Number: 3 Primary Trauma, Other Etiology: Wound Location: Right, Lateral Lower Leg Wound Open  Wounding Event: Trauma Status: Date Acquired: 12/24/2020 Comorbid Cataracts, Anemia, Sleep Apnea, Arrhythmia, Congestive Heart Weeks Of Treatment: 1 History: Failure, Coronary Artery Disease, Hypertension, Type II Diabetes, Clustered Wound: No Osteoarthritis, Neuropathy, Received Chemotherapy Photos Wound Measurements Length: (cm) 1 Width: (cm) 1.5 Depth: (cm) 0.1 Area: (cm) 1.178 Volume: (cm) 0.118 % Reduction in Area: 21.9% % Reduction in Volume:  21.9% Epithelialization: Small (1-33%) Tunneling: No Undermining: No Wound Description Classification: Full Thickness Without Exposed Support Structures Wound Margin: Flat and Intact Exudate Amount: Small Exudate Type: Serosanguineous Exudate Color: red, brown Foul Odor After Cleansing: No Slough/Fibrino No Wound Bed Granulation Amount: Large (67-100%) Exposed Structure Granulation Quality: Red Fascia Exposed: No Necrotic Amount: None Present (0%) Fat Layer (Subcutaneous Tissue) Exposed: Yes Tendon Exposed: No Muscle Exposed: No Joint Exposed: No Bone Exposed: No Treatment Notes Wound #3 (Lower Leg) Wound Laterality: Right, Lateral Cleanser Peri-Wound Care Sween Lotion (Moisturizing lotion) Discharge Instruction: Apply moisturizing lotion as directed Topical Primary Dressing KerraCel Ag Gelling Fiber Dressing, 2x2 in (silver alginate) Discharge Instruction: Apply silver alginate to wound bed as instructed Secondary Dressing Woven Gauze Sponge, Non-Sterile 4x4 in Discharge Instruction: Apply over primary dressing as directed. Secured With Compression Wrap Kerlix Roll 4.5x3.1 (in/yd) Discharge Instruction: Apply Kerlix and Coban compression as directed. Coban Self-Adherent Wrap 4x5 (in/yd) Discharge Instruction: Apply over Kerlix as directed. Compression Stockings Add-Ons Electronic Signature(s) Signed: 01/19/2021 10:49:45 AM By: Sandre Kitty Signed: 01/19/2021 1:37:28 PM By: Baruch Gouty RN, BSN Entered By: Sandre Kitty on 01/19/2021 08:14:56 -------------------------------------------------------------------------------- Wound Assessment Details Patient Name: Date of Service: Richard [name], Richard [name] Richard L. 01/19/2021 8:00 A M Medical Record Number: [id number] Patient Account Number: [id number] Date of Birth/Sex: Treating RN: 06/01/1941 (80 y.o. [name] Primary Care Tecla Mailloux: Roma Schanz Other Clinician: Referring Ewing Fandino: Treating  Keriana Sarsfield/Extender: Tharon Aquas in Treatment: 1 Wound Status Wound Number: 4 Primary Trauma, Other Etiology: Wound Location: Left, Lateral Forearm Wound Healed - Epithelialized Wounding Event: Trauma Status: Date Acquired: 12/24/2020 Comorbid Cataracts, Anemia, Sleep Apnea, Arrhythmia, Congestive Heart Weeks Of Treatment: 1 History: Failure, Coronary Artery Disease, Hypertension, Type II Diabetes, Clustered Wound: No Osteoarthritis, Neuropathy, Received Chemotherapy Wound Measurements Length: (cm) Width: (cm) Depth: (cm) Area: (cm) Volume: (cm) 0 % Reduction in Area: 100% 0 % Reduction in Volume: 100% 0 Epithelialization: Large (67-100%) 0 Tunneling: No 0 Undermining: No Wound Description Classification: Full Thickness Without Exposed Support Structures Exudate Amount: None Present Foul Odor After Cleansing: No Slough/Fibrino No Wound Bed Granulation Amount: None Present (0%) Exposed Structure Necrotic Amount: None Present (0%) Fascia Exposed: No Fat Layer (Subcutaneous Tissue) Exposed: No Tendon Exposed: No Muscle Exposed: No Joint Exposed: No Bone Exposed: No Electronic Signature(s) Signed: 01/19/2021 1:37:28 PM By: Baruch Gouty RN, BSN Entered By: Baruch Gouty on 01/19/2021 08:09:12 -------------------------------------------------------------------------------- Vitals Details Patient Name: Date of Service: Richard [name], Richard [name] Richard L. 01/19/2021 8:00 A M Medical Record Number: [id number] Patient Account Number: [id number] Date of Birth/Sex: Treating RN: 03-06-1941 (80 y.o. [name] Primary Care Dalisha Shively: Roma Schanz Other Clinician: Referring Loyde Orth: Treating Reagen Goates/Extender: Tharon Aquas in Treatment: 1 Vital Signs Time Taken: 07:51 Temperature (F): 97.9 Height (in): 72 Pulse (bpm): 75 Source: Stated Respiratory Rate (breaths/min): 18 Weight (lbs): 235 Blood Pressure  (mmHg): 118/65 Source: Stated Capillary Blood Glucose (mg/dl): 102 Body Mass Index (BMI): 31.9 Reference Range: 80 - 120 mg / dl Notes glucose per pt report this am Electronic Signature(s) Signed: 01/19/2021 1:37:28 PM By: Baruch Gouty RN, BSN Entered By: Baruch Gouty  on 01/19/2021 07:51:53

## 2021-01-19 NOTE — Progress Notes (Signed)
Hematology and Oncology Follow Up Visit  Richard [name] [id number] 1941/01/08 80 y.o. 01/19/2021   Principle Diagnosis:  Diffuse large cell non-Hodgkin's lymphoma (IPI = 3) - NOT "double hit" Pernicious anemia Iron deficiency secondary to bleeding   Past Therapy: R-CHOP - s/p cycle 8 - completed 08/2017   Current Therapy:        Vitamin B12 1 mg IM every month Xgeva 120 mg subcu q 3 months - next dose due in 03/2021 IV iron as indicated    Interim History:  Richard [name] is here today for follow-up and B 12 injection. He was hospitalized in June with sepsis without acute organ failure or shock. He was treated with IV antibiotics and went home on a 10 day course of oral antibiotics. He is feeling much better and thinks he may play a round of golf this weekend.  He states that his vascular specialist is having him wear a compression sleeve to see if this helps reduce the lymphedema in his left arm. They also feel that his pacemaker wires may be causing an issue. Of the sleeve does not work they will send him back to EP for eval of his pacemaker.  No fever, chills, n/v, cough, rash, dizziness, SOB, chest pain, palpations, abdominal pain or changes in bowel or bladder habits.  The neuropathy in his lower extremities is unchanged from baseline. His weight is stable at 240 lbs.   ECOG Performance Status: 1 - Symptomatic but completely ambulatory  Medications:  Allergies as of 01/19/2021       Reactions   Benazepril Swelling   angioedema; he is not a candidate for any angiotensin receptor blockers because of this significant allergic reaction. Because of a history of documented adverse serious drug reaction;Medi Alert bracelet  is recommended   Hctz [hydrochlorothiazide] Anaphylaxis, Swelling   Tongue and lip swelling   Aspirin Other (See Comments)   Gastritis, cant take 325 Mg aspirin    Lactose Intolerance (gi) Nausea And Vomiting        Medication List        Accurate as of  January 19, 2021  2:40 PM. If you have any questions, ask your nurse or doctor.          allopurinol 300 MG tablet Commonly known as: ZYLOPRIM Take 450 mg by mouth daily.   aluminum hydroxide-magnesium carbonate 95-358 MG/15ML Susp Commonly known as: GAVISCON Take 15 mLs by mouth as needed for indigestion or heartburn.   amLODipine 5 MG tablet Commonly known as: NORVASC TAKE 1 TABLET BY MOUTH TWICE A DAY   apixaban 5 MG Tabs tablet Commonly known as: ELIQUIS Take 1 tablet (5 mg total) by mouth 2 (two) times daily.   atorvastatin 80 MG tablet Commonly known as: LIPITOR TAKE ONE TABLET BY MOUTH AT BEDTIME   azelastine 0.1 % nasal spray Commonly known as: ASTELIN Place 2 sprays into both nostrils at bedtime as needed for rhinitis or allergies.   dapagliflozin propanediol 10 MG Tabs tablet Commonly known as: FARXIGA Take by mouth.   doxycycline 100 MG tablet Commonly known as: VIBRA-TABS Take 1 tablet (100 mg total) by mouth 2 (two) times daily.   esomeprazole 40 MG capsule Commonly known as: NexIUM Take 1 capsule (40 mg total) by mouth daily.   ezetimibe 10 MG tablet Commonly known as: ZETIA TAKE 1 TABLET BY MOUTH EVERY DAY   fenofibrate 160 MG tablet Take 1 tablet (160 mg total) by mouth daily.   folic acid 1  MG tablet Commonly known as: FOLVITE TAKE 2 TABLETS BY MOUTH EVERY DAY   freestyle lancets USE TWICE A DAY TO CHECK BLOOD SUGAR. DX E11.9   FREESTYLE LITE test strip Generic drug: glucose blood USE TO TEST BLOOD SUGAR ONCE A DAY. DX CODE: E11.9   furosemide 40 MG tablet Commonly known as: LASIX furosemide 40 mg tablet  TAKE 3 TABLETS BY MOUTH 2 (TWO) TIMES DAILY.   glimepiride 1 MG tablet Commonly known as: AMARYL TAKE 1 TABLET BY MOUTH DAILY WITH BREAKFAST.   metFORMIN 1000 MG tablet Commonly known as: GLUCOPHAGE TAKE 1 & 1/2 TABLETS BY MOUTH EVERY MORNING AND 1 TABLET IN THE EVENING.   metoprolol tartrate 25 MG tablet Commonly known as:  LOPRESSOR TAKE ONE-HALF TABLET BY MOUTH TWICE DAILY   nitroGLYCERIN 0.4 MG SL tablet Commonly known as: NITROSTAT PLACE 1 TABLET (0.4 MG TOTAL) UNDER THE TONGUE EVERY 5 (FIVE) MINUTES AS NEEDED FOR CHEST PAIN.   polycarbophil 625 MG tablet Commonly known as: FIBERCON Take 625 mg by mouth daily.   potassium chloride SA 20 MEQ tablet Commonly known as: Klor-Con M20 Take 2 tablets (40 mEq total) by mouth 2 (two) times daily.   pregabalin 150 MG capsule Commonly known as: LYRICA TAKE 1 CAPSULE BY MOUTH TWICE A DAY   psyllium 58.6 % powder Commonly known as: METAMUCIL Take 1 packet by mouth daily as needed (constipation).   torsemide 20 MG tablet Commonly known as: DEMADEX Take 20 mg by mouth daily.   Tylenol 325 MG Caps Generic drug: Acetaminophen Tylenol   Vascepa 1 g capsule Generic drug: icosapent Ethyl TAKE 2 CAPSULES BY MOUTH TWICE A DAY        Allergies:  Allergies  Allergen Reactions   Benazepril Swelling    angioedema; he is not a candidate for any angiotensin receptor blockers because of this significant allergic reaction. Because of a history of documented adverse serious drug reaction;Medi Alert bracelet  is recommended   Hctz [Hydrochlorothiazide] Anaphylaxis and Swelling    Tongue and lip swelling    Aspirin Other (See Comments)    Gastritis, cant take 325 Mg aspirin    Lactose Intolerance (Gi) Nausea And Vomiting    Past Medical History, Surgical history, Social history, and Family History were reviewed and updated.  Review of Systems: All other 10 point review of systems is negative.   Physical Exam:  height is 6' (1.829 m) and weight is 240 lb (108.9 kg). His oral temperature is 98 F (36.7 C). His blood pressure is 121/69 and his pulse is 85. His respiration is 18 and oxygen saturation is 94%.   Wt Readings from Last 3 Encounters:  01/19/21 240 lb (108.9 kg)  01/04/21 245 lb (111.1 kg)  12/24/20 243 lb (110.2 kg)    Ocular: Sclerae  unicteric, pupils equal, round and reactive to light Ear-nose-throat: Oropharynx clear, dentition fair Lymphatic: No cervical or supraclavicular adenopathy Lungs no rales or rhonchi, good excursion bilaterally Heart regular rate and rhythm, no murmur appreciated Abd soft, nontender, positive bowel sounds MSK no focal spinal tenderness, no joint edema Neuro: non-focal, well-oriented, appropriate affect Breasts: Deferred   Lab Results  Component Value Date   WBC 8.3 01/19/2021   HGB 12.0 (L) 01/19/2021   HCT 37.5 (L) 01/19/2021   MCV 90.8 01/19/2021   PLT 182 01/19/2021   Lab Results  Component Value Date   FERRITIN 1,935 (H) 12/20/2020   IRON 77 12/20/2020   TIBC 354 12/20/2020   UIBC  277 12/20/2020   IRONPCTSAT 22 12/20/2020   Lab Results  Component Value Date   RETICCTPCT 1.4 01/19/2021   RBC 4.14 (L) 01/19/2021   RETICCTABS 52.1 11/22/2011   No results found for: Nils Pyle [facility] Lab Results  Component Value Date   IGA 159 03/09/2012   Lab Results  Component Value Date   ALBUMINELP 4.2 07/27/2018   MSPIKE Not Observed 07/27/2018     Chemistry      Component Value Date/Time   NA 141 01/19/2021 1331   NA 136 03/16/2019 0938   NA 144 06/27/2017 0857   K 4.2 01/19/2021 1331   K 3.9 06/27/2017 0857   CL 107 01/19/2021 1331   CL 103 06/27/2017 0857   CO2 25 01/19/2021 1331   CO2 26 06/27/2017 0857   BUN 30 (H) 01/19/2021 1331   BUN 34 (H) 03/16/2019 0938   BUN 12 06/27/2017 0857   CREATININE 1.39 (H) 01/19/2021 1331   CREATININE 1.65 (H) 04/18/2020 0956      Component Value Date/Time   CALCIUM 10.1 01/19/2021 1331   CALCIUM 9.2 06/27/2017 0857   ALKPHOS 56 01/19/2021 1331   ALKPHOS 128 (H) 06/27/2017 0857   AST 14 (L) 01/19/2021 1331   ALT 11 01/19/2021 1331   ALT 24 06/27/2017 0857   BILITOT 0.4 01/19/2021 1331       Impression and Plan: Mr. Walberg is a very pleasant 80 yo caucasian gentleman with diffuse large B-cell lymphoma  (not "double hit" lymphoma). He completed treatment in February 2019.  B 12 given.  Iron studies pending. We will replace if needed.  Follow-up in 1 month.  He can contact our office with any questions or concerns.   Laverna Peace, NP 7/22/20222:40 PM

## 2021-01-19 NOTE — Patient Instructions (Signed)
Vitamin B12 Deficiency Vitamin B12 deficiency occurs when the body does not have enough vitamin B12, which is an important vitamin. The body needs this vitamin: To make red blood cells. To make DNA. This is the genetic material inside cells. To help the nerves work properly so they can carry messages from the brain to the body. Vitamin B12 deficiency can cause various health problems, such as a low red blood cell count (anemia) or nerve damage. What are the causes? This condition may be caused by: Not eating enough foods that contain vitamin B12. Not having enough stomach acid and digestive fluids to properly absorb vitamin B12 from the food that you eat. Certain digestive system diseases that make it hard to absorb vitamin B12. These diseases include Crohn's disease, chronic pancreatitis, and cystic fibrosis. A condition in which the body does not make enough of a protein (intrinsic factor), resulting in too few red blood cells (pernicious anemia). Having a surgery in which part of the stomach or small intestine is removed. Taking certain medicines that make it hard for the body to absorb vitamin B12. These medicines include: Heartburn medicines (antacids and proton pump inhibitors). Certain antibiotic medicines. Some medicines that are used to treat diabetes, tuberculosis, gout, or high cholesterol. What increases the risk? The following factors may make you more likely to develop a B12 deficiency: Being older than age 50. Eating a vegetarian or vegan diet, especially while you are pregnant. Eating a poor diet while you are pregnant. Taking certain medicines. Having alcoholism. What are the signs or symptoms? In some cases, there are no symptoms of this condition. If the condition leads to anemia or nerve damage, various symptoms can occur, such as: Weakness. Fatigue. Loss of appetite. Weight loss. Numbness or tingling in your hands and feet. Redness and burning of the  tongue. Confusion or memory problems. Depression. Sensory problems, such as color blindness, ringing in the ears, or loss of taste. Diarrhea or constipation. Trouble walking. If anemia is severe, symptoms can include: Shortness of breath. Dizziness. Rapid heart rate (tachycardia). How is this diagnosed? This condition may be diagnosed with a blood test to measure the level of vitamin B12 in your blood. You may also have other tests, including: A group of tests that measure certain characteristics of blood cells (complete blood count, CBC). A blood test to measure intrinsic factor. A procedure where a thin tube with a camera on the end is used to look into your stomach or intestines (endoscopy). Other tests may be needed to discover the cause of B12 deficiency. How is this treated? Treatment for this condition depends on the cause. This condition may be treated by: Changing your eating and drinking habits, such as: Eating more foods that contain vitamin B12. Drinking less alcohol or no alcohol. Getting vitamin B12 injections. Taking vitamin B12 supplements. Your health care provider will tell you which dosage is best for you. Follow these instructions at home: Eating and drinking  Eat lots of healthy foods that contain vitamin B12, including: Meats and poultry. This includes beef, pork, chicken, turkey, and organ meats, such as liver. Seafood. This includes clams, rainbow trout, salmon, tuna, and haddock. Eggs. Cereal and dairy products that are fortified. This means that vitamin B12 has been added to the food. Check the label on the package to see if the food is fortified. The items listed above may not be a complete list of recommended foods and beverages. Contact a dietitian for more information. General instructions Get any   injections that are prescribed by your health care provider. Take supplements only as told by your health care provider. Follow the directions carefully. Do  not drink alcohol if your health care provider tells you not to. In some cases, you may only be asked to limit alcohol use. Keep all follow-up visits as told by your health care provider. This is important. Contact a health care provider if: Your symptoms come back. Get help right away if you: Develop shortness of breath. Have a rapid heart rate. Have chest pain. Become dizzy or lose consciousness. Summary Vitamin B12 deficiency occurs when the body does not have enough vitamin B12. The main causes of vitamin B12 deficiency include dietary deficiency, digestive diseases, pernicious anemia, and having a surgery in which part of the stomach or small intestine is removed. In some cases, there are no symptoms of this condition. If the condition leads to anemia or nerve damage, various symptoms can occur, such as weakness, shortness of breath, and numbness. Treatment may include getting vitamin B12 injections or taking vitamin B12 supplements. Eat lots of healthy foods that contain vitamin B12. This information is not intended to replace advice given to you by your health care provider. Make sure you discuss any questions you have with your healthcare provider. Document Revised: 12/04/2018 Document Reviewed: 02/24/2018 Elsevier Patient Education  2022 [employer]  

## 2021-01-19 NOTE — Patient Instructions (Signed)
Implanted Port Home Guide An implanted port is a device that is placed under the skin. It is usually placed in the chest. The device can be used to give IV medicine, to take blood, or for dialysis. You may have an implanted port if: You need IV medicine that would be irritating to the small veins in your hands or arms. You need IV medicines, such as antibiotics, for a long period of time. You need IV nutrition for a long period of time. You need dialysis. When you have a port, your health care provider can choose to use the port instead of veins in your arms for these procedures. You may have fewer limitations when using a port than you would if you used other types of long-term IVs, and you will likely be able to return to normal activities afteryour incision heals. An implanted port has two main parts: Reservoir. The reservoir is the part where a needle is inserted to give medicines or draw blood. The reservoir is round. After it is placed, it appears as a small, raised area under your skin. Catheter. The catheter is a thin, flexible tube that connects the reservoir to a vein. Medicine that is inserted into the reservoir goes into the catheter and then into the vein. How is my port accessed? To access your port: A numbing cream may be placed on the skin over the port site. Your health care provider will put on a mask and sterile gloves. The skin over your port will be cleaned carefully with a germ-killing soap and allowed to dry. Your health care provider will gently pinch the port and insert a needle into it. Your health care provider will check for a blood return to make sure the port is in the vein and is not clogged. If your port needs to remain accessed to get medicine continuously (constant infusion), your health care provider will place a clear bandage (dressing) over the needle site. The dressing and needle will need to be changed every week, or as told by your health care provider. What  is flushing? Flushing helps keep the port from getting clogged. Follow instructions from your health care provider about how and when to flush the port. Ports are usually flushed with saline solution or a medicine called heparin. The need for flushing will depend on how the port is used: If the port is only used from time to time to give medicines or draw blood, the port may need to be flushed: Before and after medicines have been given. Before and after blood has been drawn. As part of routine maintenance. Flushing may be recommended every 4-6 weeks. If a constant infusion is running, the port may not need to be flushed. Throw away any syringes in a disposal container that is meant for sharp items (sharps container). You can buy a sharps container from a pharmacy, or you can make one by using an empty hard plastic bottle with a cover. How long will my port stay implanted? The port can stay in for as long as your health care provider thinks it is needed. When it is time for the port to come out, a surgery will be done to remove it. The surgery will be similar to the procedure that was done to putthe port in. Follow these instructions at home:  Flush your port as told by your health care provider. If you need an infusion over several days, follow instructions from your health care provider about how to take   care of your port site. Make sure you: Wash your hands with soap and water before you change your dressing. If soap and water are not available, use alcohol-based hand sanitizer. Change your dressing as told by your health care provider. Place any used dressings or infusion bags into a plastic bag. Throw that bag in the trash. Keep the dressing that covers the needle clean and dry. Do not get it wet. Do not use scissors or sharp objects near the tube. Keep the tube clamped, unless it is being used. Check your port site every day for signs of infection. Check for: Redness, swelling, or  pain. Fluid or blood. Pus or a bad smell. Protect the skin around the port site. Avoid wearing bra straps that rub or irritate the site. Protect the skin around your port from seat belts. Place a soft pad over your chest if needed. Bathe or shower as told by your health care provider. The site may get wet as long as you are not actively receiving an infusion. Return to your normal activities as told by your health care provider. Ask your health care provider what activities are safe for you. Carry a medical alert card or wear a medical alert bracelet at all times. This will let health care providers know that you have an implanted port in case of an emergency. Get help right away if: You have redness, swelling, or pain at the port site. You have fluid or blood coming from your port site. You have pus or a bad smell coming from the port site. You have a fever. Summary Implanted ports are usually placed in the chest for long-term IV access. Follow instructions from your health care provider about flushing the port and changing bandages (dressings). Take care of the area around your port by avoiding clothing that puts pressure on the area, and by watching for signs of infection. Protect the skin around your port from seat belts. Place a soft pad over your chest if needed. Get help right away if you have a fever or you have redness, swelling, pain, drainage, or a bad smell at the port site. This information is not intended to replace advice given to you by your health care provider. Make sure you discuss any questions you have with your healthcare provider. Document Revised: 11/01/2019 Document Reviewed: 11/01/2019 Elsevier Patient Education  2022 [employer]  

## 2021-01-22 ENCOUNTER — Telehealth: Payer: Self-pay

## 2021-01-22 NOTE — Telephone Encounter (Signed)
No 01/19/21 LOS noted 01/22/21  Mckale Haffey

## 2021-01-22 NOTE — Telephone Encounter (Signed)
S/w pt and he is aware of his f/u appts   [name]

## 2021-01-25 ENCOUNTER — Other Ambulatory Visit: Payer: Self-pay | Admitting: Cardiology

## 2021-01-25 DIAGNOSIS — M48061 Spinal stenosis, lumbar region without neurogenic claudication: Secondary | ICD-10-CM | POA: Diagnosis not present

## 2021-01-26 ENCOUNTER — Other Ambulatory Visit: Payer: Self-pay

## 2021-01-26 ENCOUNTER — Encounter (HOSPITAL_BASED_OUTPATIENT_CLINIC_OR_DEPARTMENT_OTHER): Payer: Medicare Other | Admitting: Internal Medicine

## 2021-01-26 ENCOUNTER — Inpatient Hospital Stay: Payer: Medicare Other

## 2021-01-26 VITALS — BP 132/56 | HR 74 | Temp 97.9°F | Resp 18

## 2021-01-26 DIAGNOSIS — S81801D Unspecified open wound, right lower leg, subsequent encounter: Secondary | ICD-10-CM | POA: Diagnosis not present

## 2021-01-26 DIAGNOSIS — G620 Drug-induced polyneuropathy: Secondary | ICD-10-CM | POA: Diagnosis not present

## 2021-01-26 DIAGNOSIS — D508 Other iron deficiency anemias: Secondary | ICD-10-CM

## 2021-01-26 DIAGNOSIS — D519 Vitamin B12 deficiency anemia, unspecified: Secondary | ICD-10-CM

## 2021-01-26 DIAGNOSIS — D510 Vitamin B12 deficiency anemia due to intrinsic factor deficiency: Secondary | ICD-10-CM | POA: Diagnosis not present

## 2021-01-26 DIAGNOSIS — D500 Iron deficiency anemia secondary to blood loss (chronic): Secondary | ICD-10-CM | POA: Diagnosis not present

## 2021-01-26 DIAGNOSIS — M7989 Other specified soft tissue disorders: Secondary | ICD-10-CM | POA: Diagnosis not present

## 2021-01-26 DIAGNOSIS — C8338 Diffuse large B-cell lymphoma, lymph nodes of multiple sites: Secondary | ICD-10-CM | POA: Diagnosis not present

## 2021-01-26 MED ORDER — SODIUM CHLORIDE 0.9 % IV SOLN
510.0000 mg | Freq: Once | INTRAVENOUS | Status: AC
  Administered 2021-01-26: 510 mg via INTRAVENOUS
  Filled 2021-01-26: qty 17

## 2021-01-26 MED ORDER — SODIUM CHLORIDE 0.9 % IV SOLN
INTRAVENOUS | Status: DC
  Filled 2021-01-26: qty 250

## 2021-01-26 MED ORDER — SODIUM CHLORIDE 0.9% FLUSH
10.0000 mL | INTRAVENOUS | Status: DC | PRN
  Administered 2021-01-26: 10 mL
  Filled 2021-01-26: qty 10

## 2021-01-26 MED ORDER — HEPARIN SOD (PORK) LOCK FLUSH 100 UNIT/ML IV SOLN
500.0000 [IU] | Freq: Once | INTRAVENOUS | Status: AC | PRN
  Administered 2021-01-26: 500 [IU]
  Filled 2021-01-26: qty 5

## 2021-01-26 MED ORDER — SODIUM CHLORIDE 0.9% FLUSH
3.0000 mL | Freq: Once | INTRAVENOUS | Status: DC | PRN
  Filled 2021-01-26: qty 10

## 2021-01-26 NOTE — Progress Notes (Signed)
ONEAL, SCHOENBERGER ([id number]) Visit Report for 01/26/2021 Chief Complaint Document Details Patient Name: Date of Service: Richard [name] [name] 01/26/2021 8:15 A M Medical Record Number: [id number] Patient Account Number: [id number] Date of Birth/Sex: Treating RN: [birth year] (80 y.o. Richard [name] Primary Care Provider: Roma Schanz Other Clinician: Referring Provider: Treating Provider/Extender: Tharon Aquas in Treatment: 2 Information Obtained from: Patient Chief Complaint Wounds to his left upper extremity and bilateral lower extremities. Electronic Signature(s) Signed: 01/26/2021 8:21:01 AM By: Kalman Shan DO Entered By: Kalman Shan on 01/26/2021 08:16:25 -------------------------------------------------------------------------------- Debridement Details Patient Name: Date of Service: Richard [name], Richard [name] [name] 01/26/2021 8:15 A M Medical Record Number: [id number] Patient Account Number: [id number] Date of Birth/Sex: Treating RN: 01/13/1941 (80 y.o. Richard [name] Primary Care Provider: Roma Schanz Other Clinician: Referring Provider: Treating Provider/Extender: Tharon Aquas in Treatment: 2 Debridement Performed for Assessment: Wound #2 Right,Posterior Lower Leg Performed By: Physician Kalman Shan, DO Debridement Type: Debridement Level of Consciousness (Pre-procedure): Awake and Alert Pre-procedure Verification/Time Out Yes - 08:05 Taken: Start Time: 08:08 Pain Control: Lidocaine 4% T opical Solution T Area Debrided (L x W): otal 3.1 (cm) x 3.4 (cm) = 10.54 (cm) Tissue and other material debrided: Viable, Non-Viable, Slough, Subcutaneous, Slough Level: Skin/Subcutaneous Tissue Debridement Description: Excisional Instrument: Curette Bleeding: Minimum Hemostasis Achieved: Pressure End Time: 08:10 Procedural Pain: 0 Post Procedural Pain: 0 Response to Treatment: Procedure was tolerated  well Level of Consciousness (Post- Awake and Alert procedure): Post Debridement Measurements of Total Wound Length: (cm) 3.1 Width: (cm) 3.4 Depth: (cm) 0.1 Volume: (cm) 0.828 Character of Wound/Ulcer Post Debridement: Improved Post Procedure Diagnosis Same as Pre-procedure Electronic Signature(s) Signed: 01/26/2021 8:21:01 AM By: Kalman Shan DO Signed: 01/26/2021 2:27:43 PM By: Baruch Gouty RN, BSN Entered By: Baruch Gouty on 01/26/2021 08:10:58 -------------------------------------------------------------------------------- HPI Details Patient Name: Date of Service: Richard [name], Richard [name] [name] 01/26/2021 8:15 A M Medical Record Number: [id number] Patient Account Number: [id number] Date of Birth/Sex: Treating RN: 1940/12/08 (80 y.o. Richard [name] Primary Care Provider: Roma Schanz Other Clinician: Referring Provider: Treating Provider/Extender: Tharon Aquas in Treatment: 2 History of Present Illness HPI Description: Admission 7/15 Richard [name] is an 80 year old male with a past medical history of type 2 diabetes on oral agents, diffuse large B-cell lymphoma, and coronary artery disease status post CABG that presents to the clinic for wounds to his left upper extremity and bilateral lower extremities. He states that he went to urgent care to be evaluated for a fever. And he was instructed to go to the ED to be further evaluated for possible sepsis. Unfortunately he passed out in his car After his appointment and was not discovered for another 3 hours. He was eventually discovered and EMS had to take him out of his vehicle. This is when he developed abrasions to his left arm and legs. He has been using Xeroform to his wound beds every other day. He reports improvement to all the wounds except for the one on the back of his right leg. He denies pain or signs of infection. 7/22; patient presents for 1 week follow-up. He has no issues or  complaints today. He has tolerated the compression wrap well on his right lower extremity. He has been able to do the dressing changes without issues to his left leg and left arm. His left arm wound has healed. He denies signs of infection. 7/29; patient presents for 1 week follow-up. He reports that  his left leg wound is healed. He tolerated the compression wrap well on his right leg. He has no issues or complaints today. He denies signs of infection. Electronic Signature(s) Signed: 01/26/2021 8:21:01 AM By: Kalman Shan DO Entered By: Kalman Shan on 01/26/2021 08:16:52 -------------------------------------------------------------------------------- Physical Exam Details Patient Name: Date of Service: Richard [name] [name] 01/26/2021 8:15 A M Medical Record Number: [id number] Patient Account Number: [id number] Date of Birth/Sex: Treating RN: [birth year] (80 y.o. Richard [name] Primary Care Provider: Roma Schanz Other Clinician: Referring Provider: Treating Provider/Extender: Tharon Aquas in Treatment: 2 Constitutional respirations regular, non-labored and within target range for patient.. Cardiovascular 2+ dorsalis pedis/posterior tibialis pulses. Psychiatric pleasant and cooperative. Notes Right lower extremity: T the posterior aspect there is an open wound with slough throughout. T the upper anterior aspect there are 2 small wounds with o o granulation tissue present. This area is limited to skin breakdown. No signs of infection. Left lower extremity: Epithelialization to previous wound sites. No signs of infection. Electronic Signature(s) Signed: 01/26/2021 8:21:01 AM By: Kalman Shan DO Entered By: Kalman Shan on 01/26/2021 08:18:14 -------------------------------------------------------------------------------- Physician Orders Details Patient Name: Date of Service: Richard [name], Richard [name] [name] 01/26/2021 8:15 A M Medical Record  Number: [id number] Patient Account Number: [id number] Date of Birth/Sex: Treating RN: 1941/01/25 (80 y.o. Richard [name] Primary Care Provider: Roma Schanz Other Clinician: Referring Provider: Treating Provider/Extender: Tharon Aquas in Treatment: 2 Verbal / Phone Orders: No Diagnosis Coding ICD-10 Coding Code Description S81.801D Unspecified open wound, right lower leg, subsequent encounter S81.802D Unspecified open wound, left lower leg, subsequent encounter S40.812D Abrasion of left upper arm, subsequent encounter Follow-up Appointments Return Appointment in 1 week. Bathing/ Shower/ Hygiene May shower with protection but do not get wound dressing(s) wet. Edema Control - Lymphedema / SCD / Other Bilateral Lower Extremities Elevate legs to the level of the heart or above for 30 minutes daily and/or when sitting, a frequency of: - throughout the day Avoid standing for long periods of time. Exercise regularly Moisturize legs daily. - left leg daily Wound Treatment Wound #2 - Lower Leg Wound Laterality: Right, Posterior Peri-Wound Care: Sween Lotion (Moisturizing lotion) 1 x Per Week/15 Days Discharge Instructions: Apply moisturizing lotion as directed Prim Dressing: Hydrofera Blue Ready Foam, 2.5 x2.5 in 1 x Per Week/15 Days ary Discharge Instructions: Apply to wound bed as instructed Prim Dressing: Santyl Ointment 1 x Per Week/15 Days ary Discharge Instructions: Apply nickel thick amount to wound bed as instructed Secondary Dressing: Woven Gauze Sponge, Non-Sterile 4x4 in 1 x Per Week/15 Days Discharge Instructions: Apply over primary dressing as directed. Compression Wrap: Kerlix Roll 4.5x3.1 (in/yd) 1 x Per Week/15 Days Discharge Instructions: Apply Kerlix and Coban compression as directed. Compression Wrap: Coban Self-Adherent Wrap 4x5 (in/yd) 1 x Per Week/15 Days Discharge Instructions: Apply over Kerlix as directed. Wound #3 -  Lower Leg Wound Laterality: Right, Lateral Peri-Wound Care: Sween Lotion (Moisturizing lotion) Discharge Instructions: Apply moisturizing lotion as directed Prim Dressing: KerraCel Ag Gelling Fiber Dressing, 2x2 in (silver alginate) ary Discharge Instructions: Apply silver alginate to wound bed as instructed Secondary Dressing: Woven Gauze Sponge, Non-Sterile 4x4 in Discharge Instructions: Apply over primary dressing as directed. Compression Wrap: Kerlix Roll 4.5x3.1 (in/yd) Discharge Instructions: Apply Kerlix and Coban compression as directed. Compression Wrap: Coban Self-Adherent Wrap 4x5 (in/yd) Discharge Instructions: Apply over Kerlix as directed. Electronic Signature(s) Signed: 01/26/2021 8:21:01 AM By: Kalman Shan DO Entered By: Kalman Shan on 01/26/2021 08:18:37 --------------------------------------------------------------------------------  Problem List Details Patient Name: Date of Service: Richard [name] [name] 01/26/2021 8:15 A M Medical Record Number: [id number] Patient Account Number: [id number] Date of Birth/Sex: Treating RN: May 17, 1941 (80 y.o. Richard [name] Primary Care Provider: Roma Schanz Other Clinician: Referring Provider: Treating Provider/Extender: Tharon Aquas in Treatment: 2 Active Problems ICD-10 Encounter Code Description Active Date MDM Diagnosis S81.801D Unspecified open wound, right lower leg, subsequent encounter 01/19/2021 No Yes S81.802D Unspecified open wound, left lower leg, subsequent encounter 01/19/2021 No Yes Inactive Problems ICD-10 Code Description Active Date Inactive Date S40.812A Abrasion of left upper arm, initial encounter 01/12/2021 01/12/2021 S81.801A Unspecified open wound, right lower leg, initial encounter 01/12/2021 01/12/2021 F63.846K Unspecified open wound, left lower leg, initial encounter 01/12/2021 01/12/2021 Resolved Problems ICD-10 Code Description Active Date Resolved  Date S40.812D Abrasion of left upper arm, subsequent encounter 01/19/2021 01/19/2021 Electronic Signature(s) Signed: 01/26/2021 8:21:01 AM By: Kalman Shan DO Entered By: Kalman Shan on 01/26/2021 08:15:55 -------------------------------------------------------------------------------- Progress Note Details Patient Name: Date of Service: Richard [name] [name] 01/26/2021 8:15 A M Medical Record Number: [id number] Patient Account Number: [id number] Date of Birth/Sex: Treating RN: 1940-10-20 (80 y.o. Richard [name] Primary Care Provider: Roma Schanz Other Clinician: Referring Provider: Treating Provider/Extender: Tharon Aquas in Treatment: 2 Subjective Chief Complaint Information obtained from Patient Wounds to his left upper extremity and bilateral lower extremities. History of Present Illness (HPI) Admission 7/15 Mr. Mikhail Heiberger is an 80 year old male with a past medical history of type 2 diabetes on oral agents, diffuse large B-cell lymphoma, and coronary artery disease status post CABG that presents to the clinic for wounds to his left upper extremity and bilateral lower extremities. He states that he went to urgent care to be evaluated for a fever. And he was instructed to go to the ED to be further evaluated for possible sepsis. Unfortunately he passed out in his car After his appointment and was not discovered for another 3 hours. He was eventually discovered and EMS had to take him out of his vehicle. This is when he developed abrasions to his left arm and legs. He has been using Xeroform to his wound beds every other day. He reports improvement to all the wounds except for the one on the back of his right leg. He denies pain or signs of infection. 7/22; patient presents for 1 week follow-up. He has no issues or complaints today. He has tolerated the compression wrap well on his right lower extremity. He has been able to do the dressing  changes without issues to his left leg and left arm. His left arm wound has healed. He denies signs of infection. 7/29; patient presents for 1 week follow-up. He reports that his left leg wound is healed. He tolerated the compression wrap well on his right leg. He has no issues or complaints today. He denies signs of infection. Patient History Information obtained from Patient. Family History Cancer - Mother, Diabetes - Maternal Grandparents, Heart Disease - Paternal Grandparents, Hypertension - Paternal Grandparents, Seizures - Child, Stroke - Father, No family history of Hereditary Spherocytosis, Kidney Disease, Lung Disease, Thyroid Problems, Tuberculosis. Social History Never smoker, Marital Status - Divorced, Alcohol Use - Never, Drug Use - No History, Caffeine Use - Rarely. Medical History Eyes Patient has history of Cataracts - Surgery Hematologic/Lymphatic Patient has history of Anemia Respiratory Patient has history of Sleep Apnea Cardiovascular Patient has history of Arrhythmia, Congestive Heart Failure, Coronary Artery Disease, Hypertension Endocrine Patient  has history of Type II Diabetes Musculoskeletal Patient has history of Osteoarthritis Neurologic Patient has history of Neuropathy Oncologic Patient has history of Received Chemotherapy Denies history of Received Radiation Medical A Surgical History Notes nd Musculoskeletal Spinal Stenosis Oncologic Large B Cell Lymphoma Objective Constitutional respirations regular, non-labored and within target range for patient.. Vitals Time Taken: 7:50 AM, Height: 72 in, Weight: 235 lbs, BMI: 31.9, Temperature: 97.9 F, Pulse: 74 bpm, Respiratory Rate: 20 breaths/min, Blood Pressure: 116/64 mmHg, Capillary Blood Glucose: 99 mg/dl. Cardiovascular 2+ dorsalis pedis/posterior tibialis pulses. Psychiatric pleasant and cooperative. General Notes: Right lower extremity: T the posterior aspect there is an open wound with  slough throughout. T the upper anterior aspect there are 2 small o o wounds with granulation tissue present. This area is limited to skin breakdown. No signs of infection. Left lower extremity: Epithelialization to previous wound sites. No signs of infection. Integumentary (Hair, Skin) Wound #1 status is Healed - Epithelialized. Original cause of wound was Trauma. The date acquired was: 12/24/2020. The wound has been in treatment 2 weeks. The wound is located on the Left,Lateral Lower Leg. The wound measures 0cm length x 0cm width x 0cm depth; 0cm^2 area and 0cm^3 volume. There is Fat Layer (Subcutaneous Tissue) exposed. There is a small amount of serosanguineous drainage noted. The wound margin is distinct with the outline attached to the wound base. There is large (67-100%) red granulation within the wound bed. There is no necrotic tissue within the wound bed. Wound #2 status is Open. Original cause of wound was Trauma. The date acquired was: 12/24/2020. The wound has been in treatment 2 weeks. The wound is located on the Right,Posterior Lower Leg. The wound measures 3.1cm length x 3.4cm width x 0.1cm depth; 8.278cm^2 area and 0.828cm^3 volume. There is Fat Layer (Subcutaneous Tissue) exposed. There is no tunneling or undermining noted. There is a medium amount of serosanguineous drainage noted. The wound margin is distinct with the outline attached to the wound base. There is small (1-33%) red granulation within the wound bed. There is a large (67-100%) amount of necrotic tissue within the wound bed including Adherent Slough. Wound #3 status is Open. Original cause of wound was Trauma. The date acquired was: 12/24/2020. The wound has been in treatment 2 weeks. The wound is located on the Right,Lateral Lower Leg. The wound measures 0.9cm length x 1cm width x 0.1cm depth; 0.707cm^2 area and 0.071cm^3 volume. There is Fat Layer (Subcutaneous Tissue) exposed. There is no tunneling or undermining noted.  There is a medium amount of serosanguineous drainage noted. The wound margin is flat and intact. There is large (67-100%) red granulation within the wound bed. There is no necrotic tissue within the wound bed. Assessment Active Problems ICD-10 Unspecified open wound, right lower leg, subsequent encounter Unspecified open wound, left lower leg, subsequent encounter Patient's wounds have improved in size and appearance. The left leg wounds are healed. The right posterior leg wound had slough and this was debrided in office. I recommended continuing Santyl and Hydrofera Blue to this area. The right anterior upper leg wound has granulation tissue present and I recommended continuing silver alginate to this area. We will continue Kerlix/Coban. No signs of infection on exam. Procedures Wound #2 Pre-procedure diagnosis of Wound #2 is a Trauma, Other located on the Right,Posterior Lower Leg . There was a Excisional Skin/Subcutaneous Tissue Debridement with a total area of 10.54 sq cm performed by Kalman Shan, DO. With the following instrument(s): Curette to remove Viable and  Non-Viable tissue/material. Material removed includes Subcutaneous Tissue and Slough and after achieving pain control using Lidocaine 4% T opical Solution. No specimens were taken. A time out was conducted at 08:05, prior to the start of the procedure. A Minimum amount of bleeding was controlled with Pressure. The procedure was tolerated well with a pain level of 0 throughout and a pain level of 0 following the procedure. Post Debridement Measurements: 3.1cm length x 3.4cm width x 0.1cm depth; 0.828cm^3 volume. Character of Wound/Ulcer Post Debridement is improved. Post procedure Diagnosis Wound #2: Same as Pre-Procedure Plan Follow-up Appointments: Return Appointment in 1 week. Bathing/ Shower/ Hygiene: May shower with protection but do not get wound dressing(s) wet. Edema Control - Lymphedema / SCD / Other: Elevate legs  to the level of the heart or above for 30 minutes daily and/or when sitting, a frequency of: - throughout the day Avoid standing for long periods of time. Exercise regularly Moisturize legs daily. - left leg daily WOUND #2: - Lower Leg Wound Laterality: Right, Posterior Peri-Wound Care: Sween Lotion (Moisturizing lotion) 1 x Per Week/15 Days Discharge Instructions: Apply moisturizing lotion as directed Prim Dressing: Hydrofera Blue Ready Foam, 2.5 x2.5 in 1 x Per Week/15 Days ary Discharge Instructions: Apply to wound bed as instructed Prim Dressing: Santyl Ointment 1 x Per Week/15 Days ary Discharge Instructions: Apply nickel thick amount to wound bed as instructed Secondary Dressing: Woven Gauze Sponge, Non-Sterile 4x4 in 1 x Per Week/15 Days Discharge Instructions: Apply over primary dressing as directed. Com pression Wrap: Kerlix Roll 4.5x3.1 (in/yd) 1 x Per Week/15 Days Discharge Instructions: Apply Kerlix and Coban compression as directed. Com pression Wrap: Coban Self-Adherent Wrap 4x5 (in/yd) 1 x Per Week/15 Days Discharge Instructions: Apply over Kerlix as directed. WOUND #3: - Lower Leg Wound Laterality: Right, Lateral Peri-Wound Care: Sween Lotion (Moisturizing lotion) Discharge Instructions: Apply moisturizing lotion as directed Prim Dressing: KerraCel Ag Gelling Fiber Dressing, 2x2 in (silver alginate) ary Discharge Instructions: Apply silver alginate to wound bed as instructed Secondary Dressing: Woven Gauze Sponge, Non-Sterile 4x4 in Discharge Instructions: Apply over primary dressing as directed. Com pression Wrap: Kerlix Roll 4.5x3.1 (in/yd) Discharge Instructions: Apply Kerlix and Coban compression as directed. Com pression Wrap: Coban Self-Adherent Wrap 4x5 (in/yd) Discharge Instructions: Apply over Kerlix as directed. 1. In office sharp debridement 2. Hydrofera Blue and Santyl to the right posterior wound 3. Silver alginate to the upper right leg wound 4.  Follow-up in 1 week Electronic Signature(s) Signed: 01/26/2021 8:21:01 AM By: Kalman Shan DO Entered By: Kalman Shan on 01/26/2021 08:20:35 -------------------------------------------------------------------------------- HxROS Details Patient Name: Date of Service: Richard [name], Richard [name] [name] 01/26/2021 8:15 A M Medical Record Number: [id number] Patient Account Number: [id number] Date of Birth/Sex: Treating RN: 1941-04-19 (80 y.o. Richard [name] Primary Care Provider: Roma Schanz Other Clinician: Referring Provider: Treating Provider/Extender: Tharon Aquas in Treatment: 2 Information Obtained From Patient Eyes Medical History: Positive for: Cataracts - Surgery Hematologic/Lymphatic Medical History: Positive for: Anemia Respiratory Medical History: Positive for: Sleep Apnea Cardiovascular Medical History: Positive for: Arrhythmia; Congestive Heart Failure; Coronary Artery Disease; Hypertension Endocrine Medical History: Positive for: Type II Diabetes Time with diabetes: 20 years Treated with: Oral agents Blood sugar tested every day: Yes Tested : Q am Musculoskeletal Medical History: Positive for: Osteoarthritis Past Medical History Notes: Spinal Stenosis Neurologic Medical History: Positive for: Neuropathy Oncologic Medical History: Positive for: Received Chemotherapy Negative for: Received Radiation Past Medical History Notes: Large B Cell Lymphoma HBO Extended History Items Eyes:  Cataracts Immunizations Pneumococcal Vaccine: Received Pneumococcal Vaccination: Yes Received Pneumococcal Vaccination On or After 60th Birthday: No Implantable Devices Yes Family and Social History Cancer: Yes - Mother; Diabetes: Yes - Maternal Grandparents; Heart Disease: Yes - Paternal Grandparents; Hereditary Spherocytosis: No; Hypertension: Yes - Paternal Grandparents; Kidney Disease: No; Lung Disease: No; Seizures: Yes - Child;  Stroke: Yes - Father; Thyroid Problems: No; Tuberculosis: No; Never smoker; Marital Status - Divorced; Alcohol Use: Never; Drug Use: No History; Caffeine Use: Rarely; Financial Concerns: No; Food, Clothing or Shelter Needs: No; Support System Lacking: No; Transportation Concerns: No Electronic Signature(s) Signed: 01/26/2021 8:21:01 AM By: Kalman Shan DO Signed: 01/26/2021 2:27:43 PM By: Baruch Gouty RN, BSN Entered By: Kalman Shan on 01/26/2021 08:17:03 -------------------------------------------------------------------------------- [facility] Details Patient Name: Date of Service: Richard [name] [name] 01/26/2021 Medical Record Number: [id number] Patient Account Number: [id number] Date of Birth/Sex: Treating RN: 09-Jan-1941 (80 y.o. Richard [name] Primary Care Provider: Roma Schanz Other Clinician: Referring Provider: Treating Provider/Extender: Tharon Aquas in Treatment: 2 Diagnosis Coding ICD-10 Codes Code Description [phone] Unspecified open wound, right lower leg, subsequent encounter S81.802D Unspecified open wound, left lower leg, subsequent encounter S40.812D Abrasion of left upper arm, subsequent encounter Facility Procedures CPT4 Code: [id number] Description: 49355 - DEB SUBQ TISSUE 20 SQ CM/< ICD-10 Diagnosis Description S81.801D Unspecified open wound, right lower leg, subsequent encounter Modifier: Quantity: 1 Physician Procedures : CPT4 Code Description Modifier [id number] - WC PHYS SUBQ TISS 20 SQ CM ICD-10 Diagnosis Description S81.801D Unspecified open wound, right lower leg, subsequent encounter Quantity: 1 Electronic Signature(s) Signed: 01/26/2021 8:21:01 AM By: Kalman Shan DO Entered By: Kalman Shan on 01/26/2021 08:20:40

## 2021-01-26 NOTE — Patient Instructions (Signed)
Ferumoxytol injection What is this medication? FERUMOXYTOL is an iron complex. Iron is used to make healthy red blood cells, which carry oxygen and nutrients throughout the body. This medicine is used totreat iron deficiency anemia. This medicine may be used for other purposes; ask your health care provider orpharmacist if you have questions. COMMON BRAND NAME(S): Feraheme What should I tell my care team before I take this medication? They need to know if you have any of these conditions: anemia not caused by low iron levels high levels of iron in the blood magnetic resonance imaging (MRI) test scheduled an unusual or allergic reaction to iron, other medicines, foods, dyes, or preservatives pregnant or trying to get pregnant breast-feeding How should I use this medication? This medicine is for injection into a vein. It is given by a health careprofessional in a hospital or clinic setting. Talk to your pediatrician regarding the use of this medicine in children.Special care may be needed. Overdosage: If you think you have taken too much of this medicine contact apoison control center or emergency room at once. NOTE: This medicine is only for you. Do not share this medicine with others. What if I miss a dose? It is important not to miss your dose. Call your doctor or health careprofessional if you are unable to keep an appointment. What may interact with this medication? This medicine may interact with the following medications: other iron products This list may not describe all possible interactions. Give your health care provider a list of all the medicines, herbs, non-prescription drugs, or dietary supplements you use. Also tell them if you smoke, drink alcohol, or use illegaldrugs. Some items may interact with your medicine. What should I watch for while using this medication? Visit your doctor or healthcare professional regularly. Tell your doctor or healthcare professional if your  symptoms do not start to get better or if theyget worse. You may need blood work done while you are taking this medicine. You may need to follow a special diet. Talk to your doctor. Foods that contain iron include: whole grains/cereals, dried fruits, beans, or peas, leafy greenvegetables, and organ meats (liver, kidney). What side effects may I notice from receiving this medication? Side effects that you should report to your doctor or health care professionalas soon as possible: allergic reactions like skin rash, itching or hives, swelling of the face, lips, or tongue breathing problems changes in blood pressure feeling faint or lightheaded, falls fever or chills flushing, sweating, or hot feelings swelling of the ankles or feet Side effects that usually do not require medical attention (report to yourdoctor or health care professional if they continue or are bothersome): diarrhea headache nausea, vomiting stomach pain This list may not describe all possible side effects. Call your doctor for medical advice about side effects. You may report side effects to FDA at1-800-FDA-1088. Where should I keep my medication? This drug is given in a hospital or clinic and will not be stored at home. NOTE: This sheet is a summary. It may not cover all possible information. If you have questions about this medicine, talk to your doctor, pharmacist, orhealth care provider.  2022 Elsevier/Gold Standard (2016-08-05 20:21:10)  

## 2021-01-29 NOTE — Progress Notes (Signed)
Richard [name] ([id number]) Visit Report for 01/26/2021 Arrival Information Details Patient Name: Date of Service: Richard [name] 01/26/2021 8:15 A M Medical Record Number: [id number] Patient Account Number: [id number] Date of Birth/Sex: Treating RN: August 10, 1940 (80 y.o. [name] Primary Care Guillermo Difrancesco: Roma Schanz Other Clinician: Referring Lameka Disla: Treating Amilliana Hayworth/Extender: Tharon Aquas in Treatment: 2 Visit Information History Since Last Visit Added or deleted any medications: No Patient Arrived: Ambulatory Any new allergies or adverse reactions: No Arrival Time: 07:50 Had a fall or experienced change in No Accompanied By: self activities of daily living that may affect Transfer Assistance: None risk of falls: Patient Identification Verified: Yes Signs or symptoms of abuse/neglect since last visito No Secondary Verification Process Completed: Yes Hospitalized since last visit: No Patient Requires Transmission-Based Precautions: No Implantable device outside of the clinic excluding No Patient Has Alerts: Yes cellular tissue based products placed in the center Patient Alerts: Patient on Blood Thinner since last visit: Has Dressing in Place as Prescribed: Yes Has Compression in Place as Prescribed: Yes Pain Present Now: No Electronic Signature(s) Signed: 01/26/2021 1:14:47 PM By: Deon Pilling Entered By: Deon Pilling on 01/26/2021 07:57:41 -------------------------------------------------------------------------------- Encounter Discharge Information Details Patient Name: Date of Service: Richard [name], Richard [name] [name] 01/26/2021 8:15 A M Medical Record Number: [id number] Patient Account Number: [id number] Date of Birth/Sex: Treating RN: 1941/01/31 (80 y.o. [name] Primary Care Kameelah Minish: Roma Schanz Other Clinician: Referring Teodoro Jeffreys: Treating Jaquavius Hudler/Extender: Tharon Aquas in  Treatment: 2 Encounter Discharge Information Items Post Procedure Vitals Discharge Condition: Stable Temperature (F): 97.9 Ambulatory Status: Ambulatory Pulse (bpm): 74 Discharge Destination: Home Respiratory Rate (breaths/min): 20 Transportation: Private Auto Blood Pressure (mmHg): 116/64 Accompanied By: self Schedule Follow-up Appointment: Yes Clinical Summary of Care: Patient Declined Electronic Signature(s) Signed: 01/26/2021 2:27:43 PM By: Baruch Gouty RN, BSN Entered By: Baruch Gouty on 01/26/2021 08:23:34 -------------------------------------------------------------------------------- Lower Extremity Assessment Details Patient Name: Date of Service: Richard [name], Richard [name] [name] 01/26/2021 8:15 A M Medical Record Number: [id number] Patient Account Number: [id number] Date of Birth/Sex: Treating RN: August 12, 1940 (80 y.o. [name] Primary Care Canary Fister: Roma Schanz Other Clinician: Referring Zinnia Tindall: Treating Samaa Ueda/Extender: Tharon Aquas in Treatment: 2 Edema Assessment Assessed: Shirlyn Goltz: Yes] Patrice Paradise: Yes] Edema: [Left: Yes] [Right: No] Calf Left: Right: Point of Measurement: 30 cm From Medial Instep 36.5 cm 37.5 cm Ankle Left: Right: Point of Measurement: 9 cm From Medial Instep 22 cm 23 cm Vascular Assessment Pulses: Dorsalis Pedis Palpable: [Left:Yes] [Right:Yes] Electronic Signature(s) Signed: 01/26/2021 1:14:47 PM By: Deon Pilling Entered By: Deon Pilling on 01/26/2021 07:58:40 -------------------------------------------------------------------------------- Multi Wound Chart Details Patient Name: Date of Service: Richard [name], Richard [name] [name] 01/26/2021 8:15 A M Medical Record Number: [id number] Patient Account Number: [id number] Date of Birth/Sex: Treating RN: 01/08/1941 (80 y.o. [name] Primary Care Meiling Hendriks: Roma Schanz Other Clinician: Referring Jamice Carreno: Treating Darian Ace/Extender: Tharon Aquas in Treatment: 2 Vital Signs Height(in): 72 Capillary Blood Glucose(mg/dl): 99 Weight(lbs): 235 Pulse(bpm): 70 Body Mass Index(BMI): 32 Blood Pressure(mmHg): 116/64 Temperature(F): 97.9 Respiratory Rate(breaths/min): 20 Photos: Left, Lateral Lower Leg Right, Posterior Lower Leg Right, Lateral Lower Leg Wound Location: Trauma Trauma Trauma Wounding Event: Trauma, Other Trauma, Other Trauma, Other Primary Etiology: Cataracts, Anemia, Sleep Apnea, Cataracts, Anemia, Sleep Apnea, Cataracts, Anemia, Sleep Apnea, Comorbid History: Arrhythmia, Congestive Heart Failure, Arrhythmia, Congestive Heart Failure, Arrhythmia, Congestive Heart Failure, Coronary Artery Disease, Coronary Artery Disease, Coronary Artery Disease, Hypertension, Type II Diabetes, Hypertension, Type II  Diabetes, Hypertension, Type II Diabetes, Osteoarthritis, Neuropathy, Received Osteoarthritis, Neuropathy, Received Osteoarthritis, Neuropathy, Received Chemotherapy Chemotherapy Chemotherapy 12/24/2020 12/24/2020 12/24/2020 Date Acquired: 2 2 2  Weeks of Treatment: Healed - Epithelialized Open Open Wound Status: 0x0x0 3.1x3.4x0.1 0.9x1x0.1 Measurements L x W x D (cm) 0 8.278 0.707 A (cm) : rea 0 0.828 0.071 Volume (cm) : 100.00% 39.80% 53.10% % Reduction in A rea: 100.00% 69.90% 53.00% % Reduction in Volume: Full Thickness Without Exposed Full Thickness Without Exposed Full Thickness Without Exposed Classification: Support Structures Support Structures Support Structures Small Medium Medium Exudate A mount: Serosanguineous Serosanguineous Serosanguineous Exudate Type: red, brown red, brown red, brown Exudate Color: Distinct, outline attached Distinct, outline attached Flat and Intact Wound Margin: Large (67-100%) Small (1-33%) Large (67-100%) Granulation A mount: Red Red Red Granulation Quality: None Present (0%) Large (67-100%) None Present (0%) Necrotic A  mount: Fat Layer (Subcutaneous Tissue): Yes Fat Layer (Subcutaneous Tissue): Yes Fat Layer (Subcutaneous Tissue): Yes Exposed Structures: Fascia: No Fascia: No Fascia: No Tendon: No Tendon: No Tendon: No Muscle: No Muscle: No Muscle: No Joint: No Joint: No Joint: No Bone: No Bone: No Bone: No Large (67-100%) Medium (34-66%) Large (67-100%) Epithelialization: N/A Debridement - Excisional N/A Debridement: Pre-procedure Verification/Time Out N/A 08:05 N/A Taken: N/A Lidocaine 4% Topical Solution N/A Pain Control: N/A Subcutaneous, Slough N/A Tissue Debrided: N/A Skin/Subcutaneous Tissue N/A Level: N/A 10.54 N/A Debridement A (sq cm): rea N/A Curette N/A Instrument: N/A Minimum N/A Bleeding: N/A Pressure N/A Hemostasis A chieved: N/A 0 N/A Procedural Pain: N/A 0 N/A Post Procedural Pain: N/A Procedure was tolerated well N/A Debridement Treatment Response: N/A 3.1x3.4x0.1 N/A Post Debridement Measurements L x W x D (cm) N/A 0.828 N/A Post Debridement Volume: (cm) N/A Debridement N/A Procedures Performed: Treatment Notes Electronic Signature(s) Signed: 01/26/2021 8:21:01 AM By: Kalman Shan DO Signed: 01/26/2021 2:27:43 PM By: Baruch Gouty RN, BSN Entered By: Kalman Shan on 01/26/2021 08:16:03 -------------------------------------------------------------------------------- Multi-Disciplinary Care Plan Details Patient Name: Date of Service: Richard [name], Richard [name] [name] 01/26/2021 8:15 A M Medical Record Number: [id number] Patient Account Number: [id number] Date of Birth/Sex: Treating RN: 04-17-1941 (80 y.o. [name] Primary Care Michella Detjen: Roma Schanz Other Clinician: Referring [name]: Treating Ruthia Person/Extender: Tharon Aquas in Treatment: 2 [place] reviewed with physician Active Inactive Wound/Skin Impairment Nursing Diagnoses: Impaired tissue integrity Knowledge deficit related  to ulceration/compromised skin integrity Goals: Patient/caregiver will verbalize understanding of skin care regimen Date Initiated: 01/12/2021 Target Resolution Date: 02/09/2021 Goal Status: Active Ulcer/skin breakdown will have a volume reduction of 30% by week 4 Date Initiated: 01/12/2021 Target Resolution Date: 02/09/2021 Goal Status: Active Interventions: Assess patient/caregiver ability to obtain necessary supplies Assess patient/caregiver ability to perform ulcer/skin care regimen upon admission and as needed Assess ulceration(s) every visit Provide education on ulcer and skin care Treatment Activities: Skin care regimen initiated : 01/12/2021 Topical wound management initiated : 01/12/2021 Notes: Electronic Signature(s) Signed: 01/26/2021 2:27:43 PM By: Baruch Gouty RN, BSN Entered By: Baruch Gouty on 01/26/2021 07:53:08 -------------------------------------------------------------------------------- Pain Assessment Details Patient Name: Date of Service: [name] 01/26/2021 8:15 A M Medical Record Number: [id number] Patient Account Number: [id number] Date of Birth/Sex: Treating RN: 12/29/1940 (80 y.o. [name] Primary Care Edie Darley: Roma Schanz Other Clinician: Referring Dakari Cregger: Treating Cory Kitt/Extender: Tharon Aquas in Treatment: 2 Active Problems Location of Pain Severity and Description of Pain Patient Has Paino No Site Locations Rate the pain. Current Pain Level: 0 Pain Management and Medication Current Pain Management:  Medication: No Cold Application: No Rest: No Massage: No Activity: No T.E.N.S.: No Heat Application: No Leg drop or elevation: No Is the Current Pain Management Adequate: Adequate How does your wound impact your activities of daily livingo Sleep: No Bathing: No Appetite: No Relationship With Others: No Bladder Continence: No Emotions: No Bowel Continence: No Work:  No Toileting: No Drive: No Dressing: No Hobbies: No Electronic Signature(s) Signed: 01/26/2021 1:14:47 PM By: Deon Pilling Entered By: Deon Pilling on 01/26/2021 07:58:17 -------------------------------------------------------------------------------- Patient/Caregiver Education Details Patient Name: Date of Service: Richard [name] 7/29/2022andnbsp8:15 A M Medical Record Number: [id number] Patient Account Number: [id number] Date of Birth/Gender: Treating RN: Apr 25, 1941 (80 y.o. [name] Primary Care Physician: Roma Schanz Other Clinician: Referring Physician: Treating Physician/Extender: Tharon Aquas in Treatment: 2 Education Assessment Education Provided To: Patient Education Topics Provided Wound/Skin Impairment: Methods: Explain/Verbal Responses: Reinforcements needed, State content correctly Electronic Signature(s) Signed: 01/26/2021 2:27:43 PM By: Baruch Gouty RN, BSN Entered By: Baruch Gouty on 01/26/2021 07:53:30 -------------------------------------------------------------------------------- Wound Assessment Details Patient Name: Date of Service: [name] 01/26/2021 8:15 A M Medical Record Number: [id number] Patient Account Number: [id number] Date of Birth/Sex: Treating RN: 12-27-1940 (80 y.o. [name] Primary Care Taquana Bartley: Roma Schanz Other Clinician: Referring Kamilo Och: Treating Terri Rorrer/Extender: Tharon Aquas in Treatment: 2 Wound Status Wound Number: 1 Primary Trauma, Other Etiology: Wound Location: Left, Lateral Lower Leg Wound Healed - Epithelialized Wounding Event: Trauma Status: Date Acquired: 12/24/2020 Comorbid Cataracts, Anemia, Sleep Apnea, Arrhythmia, Congestive Heart Weeks Of Treatment: 2 History: Failure, Coronary Artery Disease, Hypertension, Type II Diabetes, Clustered Wound: No Osteoarthritis, Neuropathy, Received  Chemotherapy Photos Wound Measurements Length: (cm) Width: (cm) Depth: (cm) Area: (cm) Volume: (cm) 0 % Reduction in Area: 100% 0 % Reduction in Volume: 100% 0 Epithelialization: Large (67-100%) 0 0 Wound Description Classification: Full Thickness Without Exposed Support Structures Wound Margin: Distinct, outline attached Exudate Amount: Small Exudate Type: Serosanguineous Exudate Color: red, brown Foul Odor After Cleansing: No Slough/Fibrino No Wound Bed Granulation Amount: Large (67-100%) Exposed Structure Granulation Quality: Red Fascia Exposed: No Necrotic Amount: None Present (0%) Fat Layer (Subcutaneous Tissue) Exposed: Yes Tendon Exposed: No Muscle Exposed: No Joint Exposed: No Bone Exposed: No Treatment Notes Wound #1 (Lower Leg) Wound Laterality: Left, Lateral Cleanser Peri-Wound Care Topical Primary Dressing Secondary Dressing Secured With Compression Wrap Compression Stockings Add-Ons Electronic Signature(s) Signed: 01/26/2021 1:14:47 PM By: Deon Pilling Signed: 01/29/2021 1:33:39 PM By: Sandre Kitty Entered By: Sandre Kitty on 01/26/2021 08:00:52 -------------------------------------------------------------------------------- Wound Assessment Details Patient Name: Date of Service: [name] 01/26/2021 8:15 A M Medical Record Number: [id number] Patient Account Number: [id number] Date of Birth/Sex: Treating RN: 26-Jul-1940 (80 y.o. [name] Primary Care Nazariah Cadet: Roma Schanz Other Clinician: Referring Adib Wahba: Treating Velencia Lenart/Extender: Tharon Aquas in Treatment: 2 Wound Status Wound Number: 2 Primary Trauma, Other Etiology: Wound Location: Right, Posterior Lower Leg Wound Open Wounding Event: Trauma Status: Date Acquired: 12/24/2020 Comorbid Cataracts, Anemia, Sleep Apnea, Arrhythmia, Congestive Heart Weeks Of Treatment: 2 History: Failure, Coronary Artery Disease,  Hypertension, Type II Diabetes, Clustered Wound: No Osteoarthritis, Neuropathy, Received Chemotherapy Photos Wound Measurements Length: (cm) 3.1 Width: (cm) 3.4 Depth: (cm) 0.1 Area: (cm) 8.278 Volume: (cm) 0.828 % Reduction in Area: 39.8% % Reduction in Volume: 69.9% Epithelialization: Medium (34-66%) Tunneling: No Undermining: No Wound Description Classification: Full Thickness Without Exposed Support Structures Wound Margin: Distinct, outline attached Exudate Amount: Medium Exudate Type: Serosanguineous Exudate Color: red,  brown Foul Odor After Cleansing: No Slough/Fibrino Yes Wound Bed Granulation Amount: Small (1-33%) Exposed Structure Granulation Quality: Red Fascia Exposed: No Necrotic Amount: Large (67-100%) Fat Layer (Subcutaneous Tissue) Exposed: Yes Necrotic Quality: Adherent Slough Tendon Exposed: No Muscle Exposed: No Joint Exposed: No Bone Exposed: No Treatment Notes Wound #2 (Lower Leg) Wound Laterality: Right, Posterior Cleanser Peri-Wound Care Sween Lotion (Moisturizing lotion) Discharge Instruction: Apply moisturizing lotion as directed Topical Primary Dressing Hydrofera Blue Ready Foam, 2.5 x2.5 in Discharge Instruction: Apply to wound bed as instructed Santyl Ointment Discharge Instruction: Apply nickel thick amount to wound bed as instructed Secondary Dressing Woven Gauze Sponge, Non-Sterile 4x4 in Discharge Instruction: Apply over primary dressing as directed. Secured With Compression Wrap Kerlix Roll 4.5x3.1 (in/yd) Discharge Instruction: Apply Kerlix and Coban compression as directed. Coban Self-Adherent Wrap 4x5 (in/yd) Discharge Instruction: Apply over Kerlix as directed. Compression Stockings Add-Ons Electronic Signature(s) Signed: 01/26/2021 1:14:47 PM By: Deon Pilling Signed: 01/29/2021 1:33:39 PM By: Sandre Kitty Entered By: Sandre Kitty on 01/26/2021  08:01:50 -------------------------------------------------------------------------------- Wound Assessment Details Patient Name: Date of Service: Richard [name], Richard [name] [name] 01/26/2021 8:15 A M Medical Record Number: [id number] Patient Account Number: [id number] Date of Birth/Sex: Treating RN: 1940-12-21 (80 y.o. [name] Primary Care Onya Eutsler: Roma Schanz Other Clinician: Referring Symiah Nowotny: Treating Shandrell Boda/Extender: Tharon Aquas in Treatment: 2 Wound Status Wound Number: 3 Primary Trauma, Other Etiology: Wound Location: Right, Lateral Lower Leg Wound Open Wounding Event: Trauma Status: Date Acquired: 12/24/2020 Comorbid Cataracts, Anemia, Sleep Apnea, Arrhythmia, Congestive Heart Weeks Of Treatment: 2 History: Failure, Coronary Artery Disease, Hypertension, Type II Diabetes, Clustered Wound: No Osteoarthritis, Neuropathy, Received Chemotherapy Photos Wound Measurements Length: (cm) 0.9 Width: (cm) 1 Depth: (cm) 0.1 Area: (cm) 0.707 Volume: (cm) 0.071 % Reduction in Area: 53.1% % Reduction in Volume: 53% Epithelialization: Large (67-100%) Tunneling: No Undermining: No Wound Description Classification: Full Thickness Without Exposed Support Structures Wound Margin: Flat and Intact Exudate Amount: Medium Exudate Type: Serosanguineous Exudate Color: red, brown Foul Odor After Cleansing: No Slough/Fibrino No Wound Bed Granulation Amount: Large (67-100%) Exposed Structure Granulation Quality: Red Fascia Exposed: No Necrotic Amount: None Present (0%) Fat Layer (Subcutaneous Tissue) Exposed: Yes Tendon Exposed: No Muscle Exposed: No Joint Exposed: No Bone Exposed: No Treatment Notes Wound #3 (Lower Leg) Wound Laterality: Right, Lateral Cleanser Peri-Wound Care Sween Lotion (Moisturizing lotion) Discharge Instruction: Apply moisturizing lotion as directed Topical Primary Dressing KerraCel Ag Gelling Fiber Dressing, 2x2 in  (silver alginate) Discharge Instruction: Apply silver alginate to wound bed as instructed Secondary Dressing Woven Gauze Sponge, Non-Sterile 4x4 in Discharge Instruction: Apply over primary dressing as directed. Secured With Compression Wrap Kerlix Roll 4.5x3.1 (in/yd) Discharge Instruction: Apply Kerlix and Coban compression as directed. Coban Self-Adherent Wrap 4x5 (in/yd) Discharge Instruction: Apply over Kerlix as directed. Compression Stockings Add-Ons Electronic Signature(s) Signed: 01/26/2021 1:14:47 PM By: Deon Pilling Signed: 01/29/2021 1:33:39 PM By: Sandre Kitty Entered By: Sandre Kitty on 01/26/2021 08:01:23 -------------------------------------------------------------------------------- Vitals Details Patient Name: Date of Service: Richard [name], Richard [name] [name] 01/26/2021 8:15 A M Medical Record Number: [id number] Patient Account Number: [id number] Date of Birth/Sex: Treating RN: Oct 16, 1940 (80 y.o. [name] Primary Care Zoeya Gramajo: Roma Schanz Other Clinician: Referring Recia Sons: Treating Dinesha Twiggs/Extender: Tharon Aquas in Treatment: 2 Vital Signs Time Taken: 07:50 Temperature (F): 97.9 Height (in): 72 Pulse (bpm): 74 Weight (lbs): 235 Respiratory Rate (breaths/min): 20 Body Mass Index (BMI): 31.9 Blood Pressure (mmHg): 116/64 Capillary Blood Glucose (mg/dl): 99 Reference Range: 80 - 120 mg /  dl Electronic Signature(s) Signed: 01/26/2021 1:14:47 PM By: Deon Pilling Entered By: Deon Pilling on 01/26/2021 07:58:03

## 2021-01-31 ENCOUNTER — Other Ambulatory Visit: Payer: Self-pay

## 2021-01-31 ENCOUNTER — Ambulatory Visit (INDEPENDENT_AMBULATORY_CARE_PROVIDER_SITE_OTHER): Payer: Medicare Other | Admitting: Dermatology

## 2021-01-31 DIAGNOSIS — C44329 Squamous cell carcinoma of skin of other parts of face: Secondary | ICD-10-CM

## 2021-01-31 DIAGNOSIS — C4492 Squamous cell carcinoma of skin, unspecified: Secondary | ICD-10-CM

## 2021-01-31 DIAGNOSIS — C44320 Squamous cell carcinoma of skin of unspecified parts of face: Secondary | ICD-10-CM

## 2021-01-31 DIAGNOSIS — L578 Other skin changes due to chronic exposure to nonionizing radiation: Secondary | ICD-10-CM | POA: Diagnosis not present

## 2021-01-31 DIAGNOSIS — D692 Other nonthrombocytopenic purpura: Secondary | ICD-10-CM | POA: Diagnosis not present

## 2021-01-31 DIAGNOSIS — D485 Neoplasm of uncertain behavior of skin: Secondary | ICD-10-CM

## 2021-01-31 DIAGNOSIS — I25810 Atherosclerosis of coronary artery bypass graft(s) without angina pectoris: Secondary | ICD-10-CM

## 2021-01-31 DIAGNOSIS — C44729 Squamous cell carcinoma of skin of left lower limb, including hip: Secondary | ICD-10-CM

## 2021-01-31 HISTORY — DX: Squamous cell carcinoma of skin, unspecified: C44.92

## 2021-01-31 NOTE — Patient Instructions (Addendum)
Wound Care Instructions  Cleanse wound gently with soap and water once a day then pat dry with clean gauze. Apply a thing coat of Petrolatum (petroleum jelly, "Vaseline") over the wound (unless you have an allergy to this). We recommend that you use a new, sterile tube of Vaseline. Do not pick or remove scabs. Do not remove the yellow or white "healing tissue" from the base of the wound.  Cover the wound with fresh, clean, nonstick gauze and secure with paper tape. You may use Band-Aids in place of gauze and tape if the would is small enough, but would recommend trimming much of the tape off as there is often too much. Sometimes Band-Aids can irritate the skin.  You should call the office for your biopsy report after 1 week if you have not already been contacted.  If you experience any problems, such as abnormal amounts of bleeding, swelling, significant bruising, significant pain, or evidence of infection, please call the office immediately.  FOR ADULT SURGERY PATIENTS: If you need something for pain relief you may take 1 extra strength Tylenol (acetaminophen) AND 2 Ibuprofen (200mg each) together every 4 hours as needed for pain. (do not take these if you are allergic to them or if you have a reason you should not take them.) Typically, you may only need pain medication for 1 to 3 days.   If you have any questions or concerns for your doctor, please call our main line at [phone] and press option 4 to reach your doctor's medical assistant. If no one answers, please leave a voicemail as directed and we will return your call as soon as possible. Messages left after 4 pm will be answered the following business day.   You may also send us a message via MyChart. We typically respond to MyChart messages within 1-2 business days.  For prescription refills, please ask your pharmacy to contact our office. Our fax number is [phone].  If you have an urgent issue when the clinic is closed that  cannot wait until the next business day, you can page your doctor at the number below.    Please note that while we do our best to be available for urgent issues outside of office hours, we are not available 24/7.   If you have an urgent issue and are unable to reach us, you may choose to seek medical care at your doctor's office, retail clinic, urgent care center, or emergency room.  If you have a medical emergency, please immediately call 911 or go to the emergency department.  Pager Numbers  - Dr. Kowalski: [phone]  - Dr. Moye: [phone]  - Dr. Stewart: [phone]  In the event of inclement weather, please call our main line at [phone] for an update on the status of any delays or closures.  Dermatology Medication Tips: Please keep the boxes that topical medications come in in order to help keep track of the instructions about where and how to use these. Pharmacies typically print the medication instructions only on the boxes and not directly on the medication tubes.   If your medication is too expensive, please contact our office at [phone] option 4 or send us a message through MyChart.   We are unable to tell what your co-pay for medications will be in advance as this is different depending on your insurance coverage. However, we may be able to find a substitute medication at lower cost or fill out paperwork to get insurance to cover a needed   medication.   If a prior authorization is required to get your medication covered by your insurance company, please allow us 1-2 business days to complete this process.  Drug prices often vary depending on where the prescription is filled and some pharmacies may offer cheaper prices.  The website www.goodrx.com contains coupons for medications through different pharmacies. The prices here do not account for what the cost may be with help from insurance (it may be cheaper with your insurance), but the website can give you the  price if you did not use any insurance.  - You can print the associated coupon and take it with your prescription to the pharmacy.  - You may also stop by our office during regular business hours and pick up a GoodRx coupon card.  - If you need your prescription sent electronically to a different pharmacy, notify our office through [facility] MyChart or by phone at [phone] option 4.   

## 2021-01-31 NOTE — Progress Notes (Signed)
Follow-Up Visit   Subjective  Richard [name] is a 80 y.o. male who presents for the following: Growths (Right cheek, left popliteal, left heel. Present for several months. ). Tender to touch.   The following portions of the chart were reviewed this encounter and updated as appropriate:       Review of Systems:  No other skin or systemic complaints except as noted in HPI or Assessment and Plan.  Objective  Well appearing patient in no apparent distress; mood and affect are within normal limits.  A focused examination was performed including face, legs, arms. Relevant physical exam findings are noted in the Assessment and Plan.  Right Zygoma 6.0 mm keratotic papule     Left Posterior Ankle 1.0 cm keratotic crusted papule       Left Popliteal 6.0 mm keratotic papule      Assessment & Plan  Actinic Damage - chronic, secondary to cumulative UV radiation exposure/sun exposure over time - diffuse scaly erythematous macules with underlying dyspigmentation - Recommend daily broad spectrum sunscreen SPF 30+ to sun-exposed areas, reapply every 2 hours as needed.  - Recommend staying in the shade or wearing long sleeves, sun glasses (UVA+UVB protection) and wide brim hats (4-inch brim around the entire circumference of the hat). - Call for new or changing lesions.  Purpura - Chronic; persistent and recurrent.  Treatable, but not curable. - Violaceous macules and patches - Benign - Related to trauma, age, sun damage and/or use of blood thinners, chronic use of topical and/or oral steroids - Observe - Can use OTC arnica containing moisturizer such as Dermend Bruise Formula if desired - Call for worsening or other concerns  Neoplasm of uncertain behavior of skin (3) Right Zygoma  Skin / nail biopsy Type of biopsy: tangential   Informed consent: discussed and consent obtained   Patient was prepped and draped in usual sterile fashion: Area prepped with  alcohol. Anesthesia: the lesion was anesthetized in a standard fashion   Anesthetic:  1% lidocaine w/ epinephrine 1-100,000 buffered w/ 8.4% NaHCO3 Instrument used: flexible razor blade   Hemostasis achieved with: pressure, aluminum chloride and electrodesiccation   Outcome: patient tolerated procedure well    Destruction of lesion  Destruction method: electrodesiccation and curettage   Informed consent: discussed and consent obtained   Timeout:  patient name, date of birth, surgical site, and procedure verified Curettage performed in three different directions: Yes   Electrodesiccation performed over the curetted area: Yes   Lesion length (cm):  0.6 Lesion width (cm):  0.6 Margin per side (cm):  0.1 Final wound size (cm):  0.8 Hemostasis achieved with:  pressure, aluminum chloride and electrodesiccation Outcome: patient tolerated procedure well with no complications   Post-procedure details: wound care instructions given   Post-procedure details comment:  Ointment and bandage applied.  Specimen 1 - Surgical pathology Differential Diagnosis: Hypertrophic AK vs ISK vs Wart r/o SCC Check Margins: No 6.0 mm keratotic papule EDC today  Left Posterior Ankle  Skin / nail biopsy Type of biopsy: tangential   Informed consent: discussed and consent obtained   Patient was prepped and draped in usual sterile fashion: Area prepped with alcohol. Anesthesia: the lesion was anesthetized in a standard fashion   Anesthetic:  1% lidocaine w/ epinephrine 1-100,000 buffered w/ 8.4% NaHCO3 Instrument used: flexible razor blade   Hemostasis achieved with: pressure, aluminum chloride and electrodesiccation   Outcome: patient tolerated procedure well    Destruction of lesion  Destruction method: electrodesiccation and  curettage   Informed consent: discussed and consent obtained   Timeout:  patient name, date of birth, surgical site, and procedure verified Curettage performed in three different  directions: Yes   Electrodesiccation performed over the curetted area: Yes   Lesion length (cm):  1 Lesion width (cm):  1 Margin per side (cm):  0.3 Final wound size (cm):  1.6 Hemostasis achieved with:  pressure, aluminum chloride and electrodesiccation Outcome: patient tolerated procedure well with no complications   Post-procedure details: wound care instructions given   Post-procedure details comment:  Ointment and bandage applied.  Specimen 2 - Surgical pathology Differential Diagnosis: Hypertrophic AK vs ISK vs Wart r/o SCC Check Margins: No 1.0 cm keratotic crusted papule EDC today  Left Popliteal  Skin / nail biopsy Type of biopsy: tangential   Informed consent: discussed and consent obtained   Patient was prepped and draped in usual sterile fashion: Area prepped with alcohol. Anesthesia: the lesion was anesthetized in a standard fashion   Anesthetic:  1% lidocaine w/ epinephrine 1-100,000 buffered w/ 8.4% NaHCO3 Instrument used: flexible razor blade   Hemostasis achieved with: pressure, aluminum chloride and electrodesiccation   Outcome: patient tolerated procedure well    Destruction of lesion  Destruction method: electrodesiccation and curettage   Informed consent: discussed and consent obtained   Timeout:  patient name, date of birth, surgical site, and procedure verified Curettage performed in three different directions: Yes   Electrodesiccation performed over the curetted area: Yes   Lesion length (cm):  0.6 Lesion width (cm):  0.6 Margin per side (cm):  0.2 Final wound size (cm):  1 Hemostasis achieved with:  pressure, aluminum chloride and electrodesiccation Outcome: patient tolerated procedure well with no complications   Post-procedure details: wound care instructions given   Post-procedure details comment:  Ointment and bandage applied.  Specimen 3 - Surgical pathology Differential Diagnosis: Hypertrophic AK vs ISK vs Wart r/o SCC Check Margins:  No 6.0 mm keratotic papule EDC today  R/o SCC  Return in about 3 months (around 05/03/2021) for f/u bxs.  IJamesetta Orleans, CMA, am acting as scribe for Brendolyn Patty, MD . Documentation: I have reviewed the above documentation for accuracy and completeness, and I agree with the above.  Brendolyn Patty MD

## 2021-02-01 ENCOUNTER — Encounter (HOSPITAL_BASED_OUTPATIENT_CLINIC_OR_DEPARTMENT_OTHER): Payer: Medicare Other | Attending: Internal Medicine | Admitting: Internal Medicine

## 2021-02-01 DIAGNOSIS — S81801D Unspecified open wound, right lower leg, subsequent encounter: Secondary | ICD-10-CM

## 2021-02-01 DIAGNOSIS — E11622 Type 2 diabetes mellitus with other skin ulcer: Secondary | ICD-10-CM | POA: Diagnosis not present

## 2021-02-02 ENCOUNTER — Ambulatory Visit (INDEPENDENT_AMBULATORY_CARE_PROVIDER_SITE_OTHER): Payer: Medicare Other

## 2021-02-02 DIAGNOSIS — I480 Paroxysmal atrial fibrillation: Secondary | ICD-10-CM | POA: Diagnosis not present

## 2021-02-02 NOTE — Progress Notes (Signed)
Richard [name] ([id number]) Visit Report for 02/01/2021 Chief Complaint Document Details Patient Name: Date of Service: Richard [name] [name] 02/01/2021 8:00 A M Medical Record Number: [id number] Patient Account Number: [id number] Date of Birth/Sex: Treating RN: [birth year] (80 y.o. Richard [name] Primary Care Provider: Roma Schanz Other Clinician: Referring Provider: Treating Provider/Extender: Tharon Aquas in Treatment: 2 Information Obtained from: Patient Chief Complaint Wounds to his left upper extremity and bilateral lower extremities. Electronic Signature(s) Signed: 02/01/2021 8:45:46 AM By: Kalman Shan DO Entered By: Kalman Shan on 02/01/2021 08:41:45 -------------------------------------------------------------------------------- Debridement Details Patient Name: Date of Service: Richard [name], Richard Larsson [name] 02/01/2021 8:00 A M Medical Record Number: [id number] Patient Account Number: [id number] Date of Birth/Sex: Treating RN: 1941-05-24 (80 y.o. Richard [name], Richard [name] Primary Care Provider: Roma Schanz Other Clinician: Referring Provider: Treating Provider/Extender: Tharon Aquas in Treatment: 2 Debridement Performed for Assessment: Wound #2 Right,Posterior Lower Leg Performed By: Physician Kalman Shan, DO Debridement Type: Debridement Level of Consciousness (Pre-procedure): Awake and Alert Pre-procedure Verification/Time Out Yes - 08:20 Taken: Start Time: 08:21 Pain Control: Lidocaine 4% T opical Solution T Area Debrided (L x W): otal 3 (cm) x 3 (cm) = 9 (cm) Tissue and other material debrided: Viable, Non-Viable, Slough, Subcutaneous, Skin: Dermis , Skin: Epidermis, Fibrin/Exudate, Slough Level: Skin/Subcutaneous Tissue Debridement Description: Excisional Instrument: Curette Bleeding: Minimum Hemostasis Achieved: Pressure End Time: 08:26 Procedural Pain: 0 Post Procedural Pain: 0 Response to  Treatment: Procedure was tolerated well Level of Consciousness (Post- Awake and Alert procedure): Post Debridement Measurements of Total Wound Length: (cm) 3 Width: (cm) 3 Depth: (cm) 0.1 Volume: (cm) 0.707 Character of Wound/Ulcer Post Debridement: Improved Post Procedure Diagnosis Same as Pre-procedure Electronic Signature(s) Signed: 02/01/2021 8:45:46 AM By: Kalman Shan DO Signed: 02/01/2021 6:14:36 PM By: Deon Pilling Entered By: Deon Pilling on 02/01/2021 08:26:39 -------------------------------------------------------------------------------- HPI Details Patient Name: Date of Service: Richard [name], Richard [name] 02/01/2021 8:00 A M Medical Record Number: [id number] Patient Account Number: [id number] Date of Birth/Sex: Treating RN: 1941/01/20 (80 y.o. Richard [name] Primary Care Provider: Roma Schanz Other Clinician: Referring Provider: Treating Provider/Extender: Tharon Aquas in Treatment: 2 History of Present Illness HPI Description: Admission 7/15 Mr. [name] is an 80 year old male with a past medical history of type 2 diabetes on oral agents, diffuse large B-cell lymphoma, and coronary artery disease status post CABG that presents to the clinic for wounds to his left upper extremity and bilateral lower extremities. He states that he went to urgent care to be evaluated for a fever. And he was instructed to go to the ED to be further evaluated for possible sepsis. Unfortunately he passed out in his car After his appointment and was not discovered for another 3 hours. He was eventually discovered and EMS had to take him out of his vehicle. This is when he developed abrasions to his left arm and legs. He has been using Xeroform to his wound beds every other day. He reports improvement to all the wounds except for the one on the back of his right leg. He denies pain or signs of infection. 7/22; patient presents for 1 week follow-up. He has no  issues or complaints today. He has tolerated the compression wrap well on his right lower extremity. He has been able to do the dressing changes without issues to his left leg and left arm. His left arm wound has healed. He denies signs of infection. 7/29; patient presents for 1 week  follow-up. He reports that his left leg wound is healed. He tolerated the compression wrap well on his right leg. He has no issues or complaints today. He denies signs of infection. 8/4; patient presents for 1 week follow-up. He continues to see improvement in wound healing to his right lower extremity. He has no issues or complaints today. He denies signs of infection. He does state that he went to the dermatologist and had some lesions removed on his face and left leg. He has been keeping these covered with a Band-Aid. Electronic Signature(s) Signed: 02/01/2021 8:45:46 AM By: Kalman Shan DO Entered By: Kalman Shan on 02/01/2021 08:42:25 -------------------------------------------------------------------------------- Physical Exam Details Patient Name: Date of Service: Richard [name], Richard Larsson [name] 02/01/2021 8:00 A M Medical Record Number: [id number] Patient Account Number: [id number] Date of Birth/Sex: Treating RN: 1941-05-26 (80 y.o. Richard [name] Primary Care Provider: Roma Schanz Other Clinician: Referring Provider: Treating Provider/Extender: Tharon Aquas in Treatment: 2 Constitutional respirations regular, non-labored and within target range for patient.. Cardiovascular 2+ dorsalis pedis/posterior tibialis pulses. Psychiatric pleasant and cooperative. Notes Right lower extremity: T the posterior aspect there is an open wound with granulation tissue and nonviable tissue present. The upper anterior previous wound o sites are epithelialized. No signs of infection. Left lower extremity: He has 2 areas of biopsy site that appear well-healing, limited to skin  breakdown. Electronic Signature(s) Signed: 02/01/2021 8:45:46 AM By: Kalman Shan DO Entered By: Kalman Shan on 02/01/2021 08:43:25 -------------------------------------------------------------------------------- Physician Orders Details Patient Name: Date of Service: Richard [name], Richard Larsson [name] 02/01/2021 8:00 A M Medical Record Number: [id number] Patient Account Number: [id number] Date of Birth/Sex: Treating RN: 1940-11-22 (80 y.o. Richard [name] Primary Care Provider: Roma Schanz Other Clinician: Referring Provider: Treating Provider/Extender: Tharon Aquas in Treatment: 2 Verbal / Phone Orders: No Diagnosis Coding ICD-10 Coding Code Description S81.801D Unspecified open wound, right lower leg, subsequent encounter S81.802D Unspecified open wound, left lower leg, subsequent encounter Follow-up Appointments ppointment in 2 weeks. - Dr. Heber Ward 02/15/2021 Return A Nurse Visit: - next week Bathing/ Shower/ Hygiene May shower with protection but do not get wound dressing(s) wet. Edema Control - Lymphedema / SCD / Other Bilateral Lower Extremities Elevate legs to the level of the heart or above for 30 minutes daily and/or when sitting, a frequency of: - throughout the day Avoid standing for long periods of time. Exercise regularly Moisturize legs daily. - left leg daily Additional Orders / Instructions Other: - ****apply triple antibiotic ointment and Band-Aids to areas on left leg where dermatology removed skin areas.*** Wound Treatment Wound #2 - Lower Leg Wound Laterality: Right, Posterior Peri-Wound Care: Sween Lotion (Moisturizing lotion) 1 x Per Week/15 Days Discharge Instructions: Apply moisturizing lotion as directed Prim Dressing: Hydrofera Blue Ready Foam, 2.5 x2.5 in 1 x Per Week/15 Days ary Discharge Instructions: Apply to wound bed as instructed Prim Dressing: Santyl Ointment 1 x Per Week/15 Days ary Discharge Instructions:  Apply nickel thick amount to wound bed as instructed Secondary Dressing: Woven Gauze Sponge, Non-Sterile 4x4 in 1 x Per Week/15 Days Discharge Instructions: Apply over primary dressing as directed. Compression Wrap: Kerlix Roll 4.5x3.1 (in/yd) 1 x Per Week/15 Days Discharge Instructions: Apply Kerlix and Coban compression as directed. Compression Wrap: Coban Self-Adherent Wrap 4x5 (in/yd) 1 x Per Week/15 Days Discharge Instructions: Apply over Kerlix as directed. Electronic Signature(s) Signed: 02/01/2021 8:45:46 AM By: Kalman Shan DO Entered By: Kalman Shan on 02/01/2021 08:43:39 -------------------------------------------------------------------------------- Problem List Details Patient Name:  Date of Service: Richard [name] [name] 02/01/2021 8:00 A M Medical Record Number: [id number] Patient Account Number: [id number] Date of Birth/Sex: Treating RN: 1941-04-24 (80 y.o. Richard [name] Primary Care Provider: Roma Schanz Other Clinician: Referring Provider: Treating Provider/Extender: Tharon Aquas in Treatment: 2 Active Problems ICD-10 Encounter Code Description Active Date MDM Diagnosis S81.801D Unspecified open wound, right lower leg, subsequent encounter 01/19/2021 No Yes S81.802D Unspecified open wound, left lower leg, subsequent encounter 01/19/2021 No Yes Inactive Problems ICD-10 Code Description Active Date Inactive Date S40.812A Abrasion of left upper arm, initial encounter 01/12/2021 01/12/2021 S81.801A Unspecified open wound, right lower leg, initial encounter 01/12/2021 01/12/2021 W10.272Z Unspecified open wound, left lower leg, initial encounter 01/12/2021 01/12/2021 Resolved Problems ICD-10 Code Description Active Date Resolved Date S40.812D Abrasion of left upper arm, subsequent encounter 01/19/2021 01/19/2021 Electronic Signature(s) Signed: 02/01/2021 8:45:46 AM By: Kalman Shan DO Entered By: Kalman Shan on 02/01/2021  08:41:06 -------------------------------------------------------------------------------- Progress Note Details Patient Name: Date of Service: Richard [name], Richard Larsson [name] 02/01/2021 8:00 A M Medical Record Number: [id number] Patient Account Number: [id number] Date of Birth/Sex: Treating RN: [birth year] (80 y.o. Richard [name] Primary Care Provider: Roma Schanz Other Clinician: Referring Provider: Treating Provider/Extender: Tharon Aquas in Treatment: 2 Subjective Chief Complaint Information obtained from Patient Wounds to his left upper extremity and bilateral lower extremities. History of Present Illness (HPI) Admission 7/15 [name] is an 80 year old male with a past medical history of type 2 diabetes on oral agents, diffuse large B-cell lymphoma, and coronary artery disease status post CABG that presents to the clinic for wounds to his left upper extremity and bilateral lower extremities. He states that he went to urgent care to be evaluated for a fever. And he was instructed to go to the ED to be further evaluated for possible sepsis. Unfortunately he passed out in his car After his appointment and was not discovered for another 3 hours. He was eventually discovered and EMS had to take him out of his vehicle. This is when he developed abrasions to his left arm and legs. He has been using Xeroform to his wound beds every other day. He reports improvement to all the wounds except for the one on the back of his right leg. He denies pain or signs of infection. 7/22; patient presents for 1 week follow-up. He has no issues or complaints today. He has tolerated the compression wrap well on his right lower extremity. He has been able to do the dressing changes without issues to his left leg and left arm. His left arm wound has healed. He denies signs of infection. 7/29; patient presents for 1 week follow-up. He reports that his left leg wound is healed. He  tolerated the compression wrap well on his right leg. He has no issues or complaints today. He denies signs of infection. 8/4; patient presents for 1 week follow-up. He continues to see improvement in wound healing to his right lower extremity. He has no issues or complaints today. He denies signs of infection. He does state that he went to the dermatologist and had some lesions removed on his face and left leg. He has been keeping these covered with a Band-Aid. Patient History Information obtained from Patient. Family History Cancer - Mother, Diabetes - Maternal Grandparents, Heart Disease - Paternal Grandparents, Hypertension - Paternal Grandparents, Seizures - Child, Stroke - Father, No family history of Hereditary Spherocytosis, Kidney Disease, Lung Disease, Thyroid Problems, Tuberculosis. Social History Never  smoker, Marital Status - Divorced, Alcohol Use - Never, Drug Use - No History, Caffeine Use - Rarely. Medical History Eyes Patient has history of Cataracts - Surgery Hematologic/Lymphatic Patient has history of Anemia Respiratory Patient has history of Sleep Apnea Cardiovascular Patient has history of Arrhythmia, Congestive Heart Failure, Coronary Artery Disease, Hypertension Endocrine Patient has history of Type II Diabetes Musculoskeletal Patient has history of Osteoarthritis Neurologic Patient has history of Neuropathy Oncologic Patient has history of Received Chemotherapy Denies history of Received Radiation Medical A Surgical History Notes nd Musculoskeletal Spinal Stenosis Oncologic Large B Cell Lymphoma Objective Constitutional respirations regular, non-labored and within target range for patient.. Vitals Time Taken: 7:51 AM, Height: 72 in, Weight: 235 lbs, BMI: 31.9, Temperature: 97.8 F, Pulse: 79 bpm, Respiratory Rate: 16 breaths/min, Blood Pressure: 123/65 mmHg, Capillary Blood Glucose: 114 mg/dl. General Notes: glucose per pt report Cardiovascular 2+  dorsalis pedis/posterior tibialis pulses. Psychiatric pleasant and cooperative. General Notes: Right lower extremity: T the posterior aspect there is an open wound with granulation tissue and nonviable tissue present. The upper anterior o previous wound sites are epithelialized. No signs of infection. Left lower extremity: He has 2 areas of biopsy site that appear well-healing, limited to skin breakdown. Integumentary (Hair, Skin) Wound #2 status is Open. Original cause of wound was Trauma. The date acquired was: 12/24/2020. The wound has been in treatment 2 weeks. The wound is located on the Right,Posterior Lower Leg. The wound measures 3cm length x 3cm width x 0.1cm depth; 7.069cm^2 area and 0.707cm^3 volume. There is Fat Layer (Subcutaneous Tissue) exposed. There is no tunneling or undermining noted. There is a medium amount of serosanguineous drainage noted. The wound margin is distinct with the outline attached to the wound base. There is medium (34-66%) pink granulation within the wound bed. There is a medium (34-66%) amount of necrotic tissue within the wound bed including Adherent Slough. Wound #3 status is Healed - Epithelialized. Original cause of wound was Trauma. The date acquired was: 12/24/2020. The wound has been in treatment 2 weeks. The wound is located on the Right,Lateral Lower Leg. The wound measures 0cm length x 0cm width x 0cm depth; 0cm^2 area and 0cm^3 volume. There is no tunneling or undermining noted. There is a none present amount of drainage noted. The wound margin is flat and intact. There is no granulation within the wound bed. There is no necrotic tissue within the wound bed. Assessment Active Problems ICD-10 Unspecified open wound, right lower leg, subsequent encounter Unspecified open wound, left lower leg, subsequent encounter Patient's wounds have improved in size and appearance since last clinic visit. He has 1 remaining wound to the posterior aspect of his  right leg. I debrided nonviable tissue. No signs of infection. I recommended continuing Santyl and Hydrofera Blue under Kerlix/Coban. Of note he has 2 areas on his left leg limited to skin breakdown from previous biopsy site done recently. At this time we will just watch these as these appear well-healing. I recommended putting antibiotic ointment on it and keeping it covered with a Band-Aid. Procedures Wound #2 Pre-procedure diagnosis of Wound #2 is a Trauma, Other located on the Right,Posterior Lower Leg . There was a Excisional Skin/Subcutaneous Tissue Debridement with a total area of 9 sq cm performed by Kalman Shan, DO. With the following instrument(s): Curette to remove Viable and Non-Viable tissue/material. Material removed includes Subcutaneous Tissue, Slough, Skin: Dermis, Skin: Epidermis, and Fibrin/Exudate after achieving pain control using Lidocaine 4% T opical Solution. A time out  was conducted at 08:20, prior to the start of the procedure. A Minimum amount of bleeding was controlled with Pressure. The procedure was tolerated well with a pain level of 0 throughout and a pain level of 0 following the procedure. Post Debridement Measurements: 3cm length x 3cm width x 0.1cm depth; 0.707cm^3 volume. Character of Wound/Ulcer Post Debridement is improved. Post procedure Diagnosis Wound #2: Same as Pre-Procedure Plan Follow-up Appointments: Return Appointment in 2 weeks. - Dr. Heber Courtland 02/15/2021 Nurse Visit: - next week Bathing/ Shower/ Hygiene: May shower with protection but do not get wound dressing(s) wet. Edema Control - Lymphedema / SCD / Other: Elevate legs to the level of the heart or above for 30 minutes daily and/or when sitting, a frequency of: - throughout the day Avoid standing for long periods of time. Exercise regularly Moisturize legs daily. - left leg daily Additional Orders / Instructions: Other: - ****apply triple antibiotic ointment and Band-Aids to areas on  left leg where dermatology removed skin areas.*** WOUND #2: - Lower Leg Wound Laterality: Right, Posterior Peri-Wound Care: Sween Lotion (Moisturizing lotion) 1 x Per Week/15 Days Discharge Instructions: Apply moisturizing lotion as directed Prim Dressing: Hydrofera Blue Ready Foam, 2.5 x2.5 in 1 x Per Week/15 Days ary Discharge Instructions: Apply to wound bed as instructed Prim Dressing: Santyl Ointment 1 x Per Week/15 Days ary Discharge Instructions: Apply nickel thick amount to wound bed as instructed Secondary Dressing: Woven Gauze Sponge, Non-Sterile 4x4 in 1 x Per Week/15 Days Discharge Instructions: Apply over primary dressing as directed. Com pression Wrap: Kerlix Roll 4.5x3.1 (in/yd) 1 x Per Week/15 Days Discharge Instructions: Apply Kerlix and Coban compression as directed. Com pression Wrap: Coban Self-Adherent Wrap 4x5 (in/yd) 1 x Per Week/15 Days Discharge Instructions: Apply over Kerlix as directed. 1. In office sharp debridement 2. Santyl and Hydrofera Blue under Kerlix/Coban 3. Antibiotic ointment and Band-Aid to the left leg 4. Follow-up next week for nurse visit 5. Follow-up with me in 2 weeks Electronic Signature(s) Signed: 02/01/2021 8:45:46 AM By: Kalman Shan DO Entered By: Kalman Shan on 02/01/2021 08:45:21 -------------------------------------------------------------------------------- HxROS Details Patient Name: Date of Service: Richard [name], Richard [name] 02/01/2021 8:00 A M Medical Record Number: [id number] Patient Account Number: [id number] Date of Birth/Sex: Treating RN: 1940-10-22 (80 y.o. Richard [name] Primary Care Provider: Roma Schanz Other Clinician: Referring Provider: Treating Provider/Extender: Tharon Aquas in Treatment: 2 Information Obtained From Patient Eyes Medical History: Positive for: Cataracts - Surgery Hematologic/Lymphatic Medical History: Positive for: Anemia Respiratory Medical  History: Positive for: Sleep Apnea Cardiovascular Medical History: Positive for: Arrhythmia; Congestive Heart Failure; Coronary Artery Disease; Hypertension Endocrine Medical History: Positive for: Type II Diabetes Time with diabetes: 20 years Treated with: Oral agents Blood sugar tested every day: Yes Tested : Q am Musculoskeletal Medical History: Positive for: Osteoarthritis Past Medical History Notes: Spinal Stenosis Neurologic Medical History: Positive for: Neuropathy Oncologic Medical History: Positive for: Received Chemotherapy Negative for: Received Radiation Past Medical History Notes: Large B Cell Lymphoma HBO Extended History Items Eyes: Cataracts Immunizations Pneumococcal Vaccine: Received Pneumococcal Vaccination: Yes Received Pneumococcal Vaccination On or After 60th Birthday: No Implantable Devices Yes Family and Social History Cancer: Yes - Mother; Diabetes: Yes - Maternal Grandparents; Heart Disease: Yes - Paternal Grandparents; Hereditary Spherocytosis: No; Hypertension: Yes - Paternal Grandparents; Kidney Disease: No; Lung Disease: No; Seizures: Yes - Child; Stroke: Yes - Father; Thyroid Problems: No; Tuberculosis: No; Never smoker; Marital Status - Divorced; Alcohol Use: Never; Drug Use: No History; Caffeine Use:  Rarely; Financial Concerns: No; Food, Clothing or Shelter Needs: No; Support System Lacking: No; Transportation Concerns: No Electronic Signature(s) Signed: 02/01/2021 8:45:46 AM By: Kalman Shan DO Signed: 02/01/2021 6:14:36 PM By: Deon Pilling Entered By: Kalman Shan on 02/01/2021 08:42:31 -------------------------------------------------------------------------------- SuperBill Details Patient Name: Date of Service: Richard [name], Richard Larsson [name] 02/01/2021 Medical Record Number: [id number] Patient Account Number: [id number] Date of Birth/Sex: Treating RN: 04/04/1941 (80 y.o. Richard [name] Primary Care Provider: Roma Schanz Other  Clinician: Referring Provider: Treating Provider/Extender: Tharon Aquas in Treatment: 2 Diagnosis Coding ICD-10 Codes Code Description [phone] Unspecified open wound, right lower leg, subsequent encounter S81.802D Unspecified open wound, left lower leg, subsequent encounter Facility Procedures CPT4 Code: [id number] Description: 64680 - DEB SUBQ TISSUE 20 SQ CM/< ICD-10 Diagnosis Description S81.801D Unspecified open wound, right lower leg, subsequent encounter Modifier: Quantity: 1 Physician Procedures : CPT4 Code Description Modifier [id number] 11042 - WC PHYS SUBQ TISS 20 SQ CM ICD-10 Diagnosis Description S81.801D Unspecified open wound, right lower leg, subsequent encounter Quantity: 1 Electronic Signature(s) Signed: 02/01/2021 8:45:46 AM By: Kalman Shan DO Entered By: Kalman Shan on 02/01/2021 08:45:26

## 2021-02-02 NOTE — Progress Notes (Signed)
Richard [name] ([id number]) Visit Report for 02/01/2021 Arrival Information Details Patient Name: Date of Service: Richard [name] Richard L. 02/01/2021 8:00 A M Medical Record Number: [id number] Patient Account Number: [id number] Date of Birth/Sex: Treating RN: 07/26/1940 (80 y.o. Richard [name] Primary Care Richard [name]: Richard [name] Other Clinician: Referring Reene Harlacher: Treating Richard [name]: Richard [name] in Treatment: 2 Visit Information History Since Last Visit Added or deleted any medications: No Patient Arrived: Ambulatory Any new allergies or adverse reactions: No Arrival Time: 07:51 Had a fall or experienced change in No Accompanied By: alone activities of daily living that may affect Transfer Assistance: None risk of falls: Patient Identification Verified: Yes Signs or symptoms of abuse/neglect since last visito No Secondary Verification Process Completed: Yes Hospitalized since last visit: No Patient Requires Transmission-Based Precautions: No Implantable device outside of the clinic excluding No Patient Has Alerts: Yes cellular tissue based products placed in the center Patient Alerts: Patient on Blood Thinner since last visit: Has Dressing in Place as Prescribed: Yes Has Compression in Place as Prescribed: Yes Pain Present Now: No Electronic Signature(s) Signed: 02/02/2021 12:35:09 PM By: Levan Hurst RN, BSN Entered By: Levan Hurst on 02/01/2021 07:51:28 -------------------------------------------------------------------------------- Encounter Discharge Information Details Patient Name: Date of Service: Richard [name], Richard [name]. 02/01/2021 8:00 A M Medical Record Number: [id number] Patient Account Number: [id number] Date of Birth/Sex: Treating RN: 26-Oct-1940 (80 y.o. Richard [name] Primary Care Brissa Asante: Richard [name] Other Clinician: Referring Richard [name]: Treating Richard [name]: Richard [name]  in Treatment: 2 Encounter Discharge Information Items Post Procedure Vitals Discharge Condition: Stable Temperature (F): 97.8 Ambulatory Status: Ambulatory Pulse (bpm): 79 Discharge Destination: Home Respiratory Rate (breaths/min): 18 Transportation: Private Auto Blood Pressure (mmHg): 123/65 Accompanied By: self Schedule Follow-up Appointment: Yes Clinical Summary of Care: Patient Declined Electronic Signature(s) Signed: 02/01/2021 5:39:10 PM By: Baruch Gouty RN, BSN Entered By: Baruch Gouty on 02/01/2021 08:40:52 -------------------------------------------------------------------------------- Lower Extremity Assessment Details Patient Name: Date of Service: Richard [name], Richard [place]. 02/01/2021 8:00 A M Medical Record Number: [id number] Patient Account Number: [id number] Date of Birth/Sex: Treating RN: January 10, 1941 (80 y.o. Richard [name] Primary Care Richard [name]: Richard [name] Other Clinician: Referring Shizuye Rupert: Treating Eymi Lipuma/Extender: Richard [name] in Treatment: 2 Edema Assessment Assessed: Richard [name]: No] Richard [name]: No] Edema: [Left: Yes] [Right: No] Calf Left: Right: Point of Measurement: 30 cm From Medial Instep 37.5 cm 36 cm Ankle Left: Right: Point of Measurement: 9 cm From Medial Instep 22 cm 23 cm Vascular Assessment Pulses: Dorsalis Pedis Palpable: [Left:Yes] [Right:Yes] Electronic Signature(s) Signed: 02/02/2021 12:35:09 PM By: Levan Hurst RN, BSN Entered By: Levan Hurst on 02/01/2021 08:05:09 -------------------------------------------------------------------------------- Multi Wound Chart Details Patient Name: Date of Service: Richard [name], Richard Ramirez. 02/01/2021 8:00 A M Medical Record Number: [id number] Patient Account Number: [id number] Date of Birth/Sex: Treating RN: 01-28-1941 (80 y.o. Richard [name] Primary Care Tomekia Helton: Richard [name] Other Clinician: Referring Shelisa Fern: Treating Ivoree Felmlee/Extender: Richard [name] in Treatment: 2 Vital Signs Height(in): 72 Capillary Blood Glucose(mg/dl): 114 Weight(lbs): 235 Pulse(bpm): 86 Body Mass Index(BMI): 32 Blood Pressure(mmHg): 123/65 Temperature(F): 97.8 Respiratory Rate(breaths/min): 16 Photos: [N/A:N/A] Right, Posterior Lower Leg Right, Lateral Lower Leg N/A Wound Location: Trauma Trauma N/A Wounding Event: Trauma, Other Trauma, Other N/A Primary Etiology: Cataracts, Anemia, Sleep Apnea, Cataracts, Anemia, Sleep Apnea, N/A Comorbid History: Arrhythmia, Congestive Heart Failure, Arrhythmia, Congestive Heart Failure, Coronary Artery Disease, Coronary Artery Disease, Hypertension, Type II Diabetes, Hypertension, Type II Diabetes, Osteoarthritis, Neuropathy, Received Osteoarthritis, Neuropathy, Received Chemotherapy Chemotherapy  12/24/2020 12/24/2020 N/A Date Acquired: 2 2 N/A Weeks of Treatment: Open Healed - Epithelialized N/A Wound Status: 3x3x0.1 0x0x0 N/A Measurements L x W x D (cm) 7.069 0 N/A A (cm) : rea 0.707 0 N/A Volume (cm) : 48.60% 100.00% N/A % Reduction in A rea: 74.30% 100.00% N/A % Reduction in Volume: Full Thickness Without Exposed Full Thickness Without Exposed N/A Classification: Support Structures Support Structures Medium None Present N/A Exudate A mount: Serosanguineous N/A N/A Exudate Type: red, brown N/A N/A Exudate Color: Distinct, outline attached Flat and Intact N/A Wound Margin: Medium (34-66%) None Present (0%) N/A Granulation A mount: Pink N/A N/A Granulation Quality: Medium (34-66%) None Present (0%) N/A Necrotic A mount: Fat Layer (Subcutaneous Tissue): Yes Fascia: No N/A Exposed Structures: Fascia: No Fat Layer (Subcutaneous Tissue): No Tendon: No Tendon: No Muscle: No Muscle: No Joint: No Joint: No Bone: No Bone: No Medium (34-66%) Large (67-100%) N/A Epithelialization: Debridement - Excisional N/A N/A Debridement: Pre-procedure  Verification/Time Out 08:20 N/A N/A Taken: Lidocaine 4% Topical Solution N/A N/A Pain Control: Subcutaneous, Slough N/A N/A Tissue Debrided: Skin/Subcutaneous Tissue N/A N/A Level: 9 N/A N/A Debridement A (sq cm): rea Curette N/A N/A Instrument: Minimum N/A N/A Bleeding: Pressure N/A N/A Hemostasis A chieved: 0 N/A N/A Procedural Pain: 0 N/A N/A Post Procedural Pain: Procedure was tolerated well N/A N/A Debridement Treatment Response: 3x3x0.1 N/A N/A Post Debridement Measurements L x W x D (cm) 0.707 N/A N/A Post Debridement Volume: (cm) Debridement N/A N/A Procedures Performed: Treatment Notes Wound #2 (Lower Leg) Wound Laterality: Right, Posterior Cleanser Peri-Wound Care Sween Lotion (Moisturizing lotion) Discharge Instruction: Apply moisturizing lotion as directed Topical Primary Dressing Hydrofera Blue Ready Foam, 2.5 x2.5 in Discharge Instruction: Apply to wound bed as instructed Santyl Ointment Discharge Instruction: Apply nickel thick amount to wound bed as instructed Secondary Dressing Woven Gauze Sponge, Non-Sterile 4x4 in Discharge Instruction: Apply over primary dressing as directed. Secured With Compression Wrap Kerlix Roll 4.5x3.1 (in/yd) Discharge Instruction: Apply Kerlix and Coban compression as directed. Coban Self-Adherent Wrap 4x5 (in/yd) Discharge Instruction: Apply over Kerlix as directed. Compression Stockings Add-Ons Electronic Signature(s) Signed: 02/01/2021 8:45:46 AM By: Kalman Shan DO Signed: 02/01/2021 6:14:36 PM By: Deon Pilling Entered By: Kalman Shan on 02/01/2021 08:41:38 -------------------------------------------------------------------------------- Multi-Disciplinary Care Plan Details Patient Name: Date of Service: Richard [name], [name] Richard L. 02/01/2021 8:00 A M Medical Record Number: [id number] Patient Account Number: [id number] Date of Birth/Sex: Treating RN: 1940/07/16 (80 y.o. [name] Primary Care Julien Berryman:  Richard [name] Other Clinician: Referring Odetta Forness: Treating Dorothye Berni/Extender: Richard [name] in Treatment: 2 [place] reviewed with physician Active Inactive Wound/Skin Impairment Nursing Diagnoses: Impaired tissue integrity Knowledge deficit related to ulceration/compromised skin integrity Goals: Patient/caregiver will verbalize understanding of skin care regimen Date Initiated: 01/12/2021 Target Resolution Date: 02/09/2021 Goal Status: Active Ulcer/skin breakdown will have a volume reduction of 30% by week 4 Date Initiated: 01/12/2021 Target Resolution Date: 02/09/2021 Goal Status: Active Interventions: Assess patient/caregiver ability to obtain necessary supplies Assess patient/caregiver ability to perform ulcer/skin care regimen upon admission and as needed Assess ulceration(s) every visit Provide education on ulcer and skin care Treatment Activities: Skin care regimen initiated : 01/12/2021 Topical wound management initiated : 01/12/2021 Notes: Electronic Signature(s) Signed: 02/01/2021 6:14:36 PM By: Deon Pilling Entered By: Deon Pilling on 02/01/2021 07:54:26 -------------------------------------------------------------------------------- Pain Assessment Details Patient Name: Date of Service: Richard [name], [name] Richard L. 02/01/2021 8:00 A M Medical Record Number: [id number] Patient Account Number: [id number] Date of Birth/Sex: Treating RN:  May 28, 1941 (80 y.o. Richard [name] Primary Care Athea Haley: Richard [name] Other Clinician: Referring Geneva Pallas: Treating Limmie Schoenberg/Extender: Richard [name] in Treatment: 2 Active Problems Location of Pain Severity and Description of Pain Patient Has Paino No Site Locations Pain Management and Medication Current Pain Management: Electronic Signature(s) Signed: 02/02/2021 12:35:09 PM By: Levan Hurst RN, BSN Entered By: Levan Hurst on 02/01/2021  07:52:04 -------------------------------------------------------------------------------- Patient/Caregiver Education Details Patient Name: Date of Service: Gavin Potters 8/4/2022andnbsp8:00 A M Medical Record Number: [id number] Patient Account Number: [id number] Date of Birth/Gender: Treating RN: [birth year] (80 y.o. [name] Primary Care Physician: Richard [name] Other Clinician: Referring Physician: Treating Physician/Extender: Richard [name] in Treatment: 2 Education Assessment Education Provided To: Patient Education Topics Provided Wound/Skin Impairment: Handouts: Skin Care Do's and Dont's Methods: Explain/Verbal Responses: Reinforcements needed Electronic Signature(s) Signed: 02/01/2021 6:14:36 PM By: Deon Pilling Entered By: Deon Pilling on 02/01/2021 07:54:36 -------------------------------------------------------------------------------- Wound Assessment Details Patient Name: Date of Service: Richard [name], [name] Richard L. 02/01/2021 8:00 A M Medical Record Number: [id number] Patient Account Number: [id number] Date of Birth/Sex: Treating RN: 1940-11-17 (80 y.o. Richard [name] Primary Care Aarib Pulido: Richard [name] Other Clinician: Referring Donavyn Fecher: Treating Ambriel Gorelick/Extender: Richard [name] in Treatment: 2 Wound Status Wound Number: 2 Primary Trauma, Other Etiology: Wound Location: Right, Posterior Lower Leg Wound Open Wounding Event: Trauma Status: Date Acquired: 12/24/2020 Comorbid Cataracts, Anemia, Sleep Apnea, Arrhythmia, Congestive Heart Weeks Of Treatment: 2 History: Failure, Coronary Artery Disease, Hypertension, Type II Diabetes, Clustered Wound: No Osteoarthritis, Neuropathy, Received Chemotherapy Photos Wound Measurements Length: (cm) 3 Width: (cm) 3 Depth: (cm) 0.1 Area: (cm) 7.069 Volume: (cm) 0.707 % Reduction in Area: 48.6% % Reduction in Volume:  74.3% Epithelialization: Medium (34-66%) Tunneling: No Undermining: No Wound Description Classification: Full Thickness Without Exposed Support Structures Wound Margin: Distinct, outline attached Exudate Amount: Medium Exudate Type: Serosanguineous Exudate Color: red, brown Foul Odor After Cleansing: No Slough/Fibrino Yes Wound Bed Granulation Amount: Medium (34-66%) Exposed Structure Granulation Quality: Pink Fascia Exposed: No Necrotic Amount: Medium (34-66%) Fat Layer (Subcutaneous Tissue) Exposed: Yes Necrotic Quality: Adherent Slough Tendon Exposed: No Muscle Exposed: No Joint Exposed: No Bone Exposed: No Treatment Notes Wound #2 (Lower Leg) Wound Laterality: Right, Posterior Cleanser Peri-Wound Care Sween Lotion (Moisturizing lotion) Discharge Instruction: Apply moisturizing lotion as directed Topical Primary Dressing Hydrofera Blue Ready Foam, 2.5 x2.5 in Discharge Instruction: Apply to wound bed as instructed Santyl Ointment Discharge Instruction: Apply nickel thick amount to wound bed as instructed Secondary Dressing Woven Gauze Sponge, Non-Sterile 4x4 in Discharge Instruction: Apply over primary dressing as directed. Secured With Compression Wrap Kerlix Roll 4.5x3.1 (in/yd) Discharge Instruction: Apply Kerlix and Coban compression as directed. Coban Self-Adherent Wrap 4x5 (in/yd) Discharge Instruction: Apply over Kerlix as directed. Compression Stockings Add-Ons Electronic Signature(s) Signed: 02/02/2021 12:35:09 PM By: Levan Hurst RN, BSN Entered By: Levan Hurst on 02/01/2021 08:02:52 -------------------------------------------------------------------------------- Wound Assessment Details Patient Name: Date of Service: Richard [name], [place]. 02/01/2021 8:00 A M Medical Record Number: [id number] Patient Account Number: [id number] Date of Birth/Sex: Treating RN: May 28, 1941 (80 y.o. Richard [name] Primary Care Deepa Barthel: Richard [name] Other  Clinician: Referring Owynn Mosqueda: Treating Nazariah Cadet/Extender: Richard [name] in Treatment: 2 Wound Status Wound Number: 3 Primary Trauma, Other Etiology: Wound Location: Right, Lateral Lower Leg Wound Healed - Epithelialized Wounding Event: Trauma Status: Date Acquired: 12/24/2020 Comorbid Cataracts, Anemia, Sleep Apnea, Arrhythmia, Congestive Heart Weeks Of Treatment: 2 History: Failure, Coronary Artery Disease, Hypertension, Type II Diabetes,  Clustered Wound: No Osteoarthritis, Neuropathy, Received Chemotherapy Photos Wound Measurements Length: (cm) Width: (cm) Depth: (cm) Area: (cm) Volume: (cm) 0 % Reduction in Area: 100% 0 % Reduction in Volume: 100% 0 Epithelialization: Large (67-100%) 0 Tunneling: No 0 Undermining: No Wound Description Classification: Full Thickness Without Exposed Support Structures Wound Margin: Flat and Intact Exudate Amount: None Present Foul Odor After Cleansing: No Slough/Fibrino No Wound Bed Granulation Amount: None Present (0%) Exposed Structure Necrotic Amount: None Present (0%) Fascia Exposed: No Fat Layer (Subcutaneous Tissue) Exposed: No Tendon Exposed: No Muscle Exposed: No Joint Exposed: No Bone Exposed: No Electronic Signature(s) Signed: 02/02/2021 12:35:09 PM By: Levan Hurst RN, BSN Entered By: Levan Hurst on 02/01/2021 08:03:35 -------------------------------------------------------------------------------- Vitals Details Patient Name: Date of Service: Richard [name], [name] Richard L. 02/01/2021 8:00 A M Medical Record Number: [id number] Patient Account Number: [id number] Date of Birth/Sex: Treating RN: 08/16/1940 (80 y.o. Richard [name] Primary Care Deyjah Kindel: Richard [name] Other Clinician: Referring Cameo Shewell: Treating Kenlynn Houde/Extender: Richard [name] in Treatment: 2 Vital Signs Time Taken: 07:51 Temperature (F): 97.8 Height (in): 72 Pulse (bpm): 79 Weight  (lbs): 235 Respiratory Rate (breaths/min): 16 Body Mass Index (BMI): 31.9 Blood Pressure (mmHg): 123/65 Capillary Blood Glucose (mg/dl): 114 Reference Range: 80 - 120 mg / dl Notes glucose per pt report Electronic Signature(s) Signed: 02/02/2021 12:35:09 PM By: Levan Hurst RN, BSN Entered By: Levan Hurst on 02/01/2021 07:51:59

## 2021-02-05 LAB — CUP PACEART REMOTE DEVICE CHECK
Battery Remaining Longevity: 26 mo
Battery Voltage: 2.92 V
Brady Statistic AP VP Percent: 16.17 %
Brady Statistic AP VS Percent: 0 %
Brady Statistic AS VP Percent: 83.47 %
Brady Statistic AS VS Percent: 0.36 %
Brady Statistic RA Percent Paced: 16.26 %
Brady Statistic RV Percent Paced: 99.62 %
Date Time Interrogation Session: 20220805001535
Implantable Lead Implant Date: 20190313
Implantable Lead Implant Date: 20190313
Implantable Lead Location: 753859
Implantable Lead Location: 753860
Implantable Lead Model: 3830
Implantable Lead Model: 5076
Implantable Pulse Generator Implant Date: 20190313
Lead Channel Impedance Value: 304 Ohm
Lead Channel Impedance Value: 342 Ohm
Lead Channel Impedance Value: 418 Ohm
Lead Channel Impedance Value: 418 Ohm
Lead Channel Pacing Threshold Amplitude: 0.875 V
Lead Channel Pacing Threshold Amplitude: 1 V
Lead Channel Pacing Threshold Pulse Width: 0.4 ms
Lead Channel Pacing Threshold Pulse Width: 0.4 ms
Lead Channel Sensing Intrinsic Amplitude: 2.125 mV
Lead Channel Sensing Intrinsic Amplitude: 2.125 mV
Lead Channel Sensing Intrinsic Amplitude: 3 mV
Lead Channel Sensing Intrinsic Amplitude: 3 mV
Lead Channel Setting Pacing Amplitude: 1.75 V
Lead Channel Setting Pacing Amplitude: 2.5 V
Lead Channel Setting Pacing Pulse Width: 1 ms
Lead Channel Setting Sensing Sensitivity: 1.2 mV

## 2021-02-05 NOTE — Progress Notes (Signed)
Electrophysiology Office Note Date: 02/06/2021  ID:  Richard [name], DOB 05-12-1941, MRN [id number]  PCP: [name], DO Primary Cardiologist: Minus Breeding, MD Electrophysiologist: [name], MD   CC: Pacemaker follow-up  Richard [name] is a 80 y.o. male seen today for [name], MD for routine electrophysiology followup.  Since last being seen in our clinic the patient reports doing OK.  Over the past several months he has developed worsening Left arm swelling concerning for significant subclavian stenosis.   [place] 10/20/2020 with no evidence of DVT.   CT 10/24/20 1. No evidence for lymphadenopathy in the chest. 2. Soft tissue stranding in the left axilla with small axillary lymph nodes. Imaging features suggest edema/inflammation. 3. Venous anatomy of the left axillary region is un opacified due to bolus timing and use of the right upper extremity for contrast administration. Patient had left upper extremity venous ultrasound on 10/20/2020. 4. Aortic Atherosclerosis (ICD10-I70.0).  Saw Dr. Valinda Hoar 12/21/20 who recommended EP follow up to discuss. Note says  "discussed with the patient the possibility of a central venogram and intervention however this most likely would be of limited durability due to the indwelling pacing wires and could potentially injure the conduction system of the pacing wires with the angioplasty.  The patient so far has fairly minimal symptoms from this and does not feel debilitated by this.  I believe the best option at this point would be to fit him for a left upper extremity compression garment to control swelling symptoms.  If his symptoms worsen over time and he feels this is more lifestyle limiting we could consider a central venogram.  However in light of his age any procedure would be of higher risk may be only of minimal durability.   If he develops any infections in his left upper extremity this should be treated very aggressively since  he has some lymphatic obstruction probably as well as venous outflow obstruction."   Today he is doing well. He is concerned that his arm will continue to get worse. With compression it improves and is "only slightly bigger than his right arm" in the mornings, but gets progressively bigger throughout the day. No syncope.   Device History: Medtronic Dual Chamber PPM implanted 08/2017 for CHB  Past Medical History:  Diagnosis Date   Anemia    Arthritis    hips   Axillary adenopathy 02/25/2017   Bradycardia    a. holter monitor has demonstrated HRs in 30s and Weinkibach    CAD (coronary artery disease)    a. s/p CABG 2001  b.  07/28/2017 cath:   Severe three-vessel native CAD with total occlusion of LAD, ramus intermedius, first OM and RCA, patent RIMA to PDA, LIMA to LAD, sequential SVG to ramus intermedius and first OM.     Chronic lower back pain    Diffuse large B cell lymphoma (HCC)    Diverticulosis    Esophageal stricture    GERD (gastroesophageal reflux disease)    History of gout    HTN (hypertension)    Mixed hyperlipidemia    OSA on CPAP    with 2L O2 at night   Pancytopenia ([place])    a. related to chemo therapy for B cell lymphoma   Peptic stricture of esophagus    Presence of permanent cardiac pacemaker    sees Dr. Valaria Good pacemaker   Severe aortic stenosis    Spinal stenosis    Squamous cell carcinoma of skin 03/22/2009  Right mandible. SCCis, hypertrophic.    Type II diabetes mellitus ([place])    Wears dentures    partial upper   Past Surgical History:  Procedure Laterality Date   APPENDECTOMY  ~ [place] Left 11/06/2016   Procedure: CATARACT EXTRACTION PHACO AND INTRAOCULAR LENS PLACEMENT (IOC);  Surgeon: Estill Cotta, MD;  Location: [facility];  Service: Ophthalmology;  Laterality: Left;  Lot # [id number] H [place]: 01:09.4 AP%:25.2 CDE: 30.64   CATARACT EXTRACTION W/PHACO Right 12/04/2016   Procedure: CATARACT  EXTRACTION PHACO AND INTRAOCULAR LENS PLACEMENT (IOC);  Surgeon: Estill Cotta, MD;  Location: [facility];  Service: Ophthalmology;  Laterality: Right;  [place] 1:25.9 AP% 24.1 CDE 39.10 Fluid Pack lot # [id number]   COLONOSCOPY W/ BIOPSIES AND POLYPECTOMY  2013   CORONARY ANGIOPLASTY  1993   CORONARY ANGIOPLASTY WITH STENT PLACEMENT  05/1997   "1"   CORONARY ARTERY BYPASS GRAFT  03/2000   "CABG X5"   ECTROPION REPAIR Right 09/01/2018   Procedure: REPAIR OF ECTROPION BILATERAL upper and lower;  Surgeon: Karle Starch, MD;  Location: [place];  Service: Ophthalmology;  Laterality: Right;  Diabetic - oral meds sleep apnea   ESOPHAGEAL DILATION  X 3-4   Dr. Lyla Son; "last one was in the 1990's"   ESOPHAGOGASTRODUODENOSCOPY     multiple   FLEXIBLE SIGMOIDOSCOPY     multiple   HYDRADENITIS EXCISION Left 02/25/2017   Procedure: EXCISION DEEP LEFT AXILLARY LYMPH NODE;  Surgeon: Fanny Skates, MD;  Location: [place];  Service: General;  Laterality: Left;   INTRAOPERATIVE TRANSTHORACIC ECHOCARDIOGRAM N/A 09/09/2017   Procedure: INTRAOPERATIVE TRANSTHORACIC ECHOCARDIOGRAM;  Surgeon: Burnell Blanks, MD;  Location: [place];  Service: Open Heart Surgery;  Laterality: N/A;   KNEE ARTHROSCOPY Left 2011   meniscus repair   LEFT HEART CATHETERIZATION WITH CORONARY/GRAFT ANGIOGRAM N/A 03/16/2014   Procedure: LEFT HEART CATHETERIZATION WITH Beatrix Fetters;  Surgeon: Burnell Blanks, MD;  Location: [facility];  Service: Cardiovascular;  Laterality: N/A;   LUMBAR LAMINECTOMY/DECOMPRESSION MICRODISCECTOMY Right 06/17/2013   Procedure: LUMBAR LAMINECTOMY MICRODISCECTOMY L4-L5 RIGHT EXCISION OF SYNOVIAL CYST RIGHT   (1 LEVEL) RIGHT PARTIAL FACETECTOMY;  Surgeon: Tobi Bastos, MD;  Location: [facility];  Service: Orthopedics;  Laterality: Right;   MYELOGRAM  04/06/13   lumbar, Dr Gladstone Lighter   ORBITAL LESION EXCISION Right 09/01/2018   Procedure: ORBITOTOMY WITHOUT BONE FLAP WITH  REMOVAL OF LESION RIGHT;  Surgeon: Karle Starch, MD;  Location: [place];  Service: Ophthalmology;  Laterality: Right;   PACEMAKER IMPLANT N/A 09/10/2017   Procedure: PACEMAKER IMPLANT;  Surgeon: Deboraha Sprang, MD;  Location: [place] [facility];  Service: Cardiovascular;  Laterality: N/A;   PANENDOSCOPY     PORTACATH PLACEMENT N/A 03/06/2017   Procedure: INSERTION PORT-A-CATH AND ASPIRATE SEROMA LEFT AXILLA;  Surgeon: Fanny Skates, MD;  Location: [place];  Service: General;  Laterality: N/A;   RIGHT/LEFT HEART CATH AND CORONARY/GRAFT ANGIOGRAPHY N/A 07/28/2017   Procedure: RIGHT/LEFT HEART CATH AND CORONARY/GRAFT ANGIOGRAPHY;  Surgeon: Sherren Mocha, MD;  Location: [place] [facility];  Service: Cardiovascular;  Laterality: N/A;   SHOULDER SURGERY Right 08/2010   screws placed; "tendons tore off"   SKIN CANCER EXCISION Right    "neck"   TONSILLECTOMY  ~ [place] Left 06/29/2019   Procedure: TOTAL KNEE ARTHROPLASTY;  Surgeon: Paralee Cancel, MD;  Location: [facility];  Service: Orthopedics;  Laterality: Left;  70 mins   TRANSCATHETER AORTIC VALVE REPLACEMENT, TRANSFEMORAL N/A 09/09/2017   Procedure: TRANSCATHETER AORTIC VALVE REPLACEMENT, TRANSFEMORAL;  Surgeon: Burnell Blanks, MD;  Location: [place];  Service: Open Heart Surgery;  Laterality: N/A;   UPPER GI ENDOSCOPY  2013   Gastritis; Dr Elenore Paddy      Current Outpatient Medications  Medication Sig Dispense Refill   Acetaminophen (TYLENOL) 325 MG CAPS Tylenol     allopurinol (ZYLOPRIM) 300 MG tablet Take 450 mg by mouth daily.     aluminum hydroxide-magnesium carbonate (GAVISCON) 95-358 MG/15ML SUSP Take 15 mLs by mouth as needed for indigestion or heartburn.     amLODipine (NORVASC) 5 MG tablet TAKE 1 TABLET BY MOUTH TWICE A DAY 180 tablet 3   apixaban (ELIQUIS) 5 MG TABS tablet Take 1 tablet (5 mg total) by mouth 2 (two) times daily. 180 tablet 1   atorvastatin (LIPITOR) 80 MG tablet TAKE  ONE TABLET BY MOUTH AT BEDTIME 90 tablet 3   azelastine (ASTELIN) 0.1 % nasal spray Place 2 sprays into both nostrils at bedtime as needed for rhinitis or allergies. 30 mL 12   dapagliflozin propanediol (FARXIGA) 10 MG TABS tablet Take by mouth.     esomeprazole (NEXIUM) 40 MG capsule Take 1 capsule (40 mg total) by mouth daily. 30 capsule 5   ezetimibe (ZETIA) 10 MG tablet TAKE 1 TABLET BY MOUTH EVERY DAY 90 tablet 3   fenofibrate 160 MG tablet Take 1 tablet (160 mg total) by mouth daily. 90 tablet 0   folic acid (FOLVITE) 1 MG tablet TAKE 2 TABLETS BY MOUTH EVERY DAY 180 tablet 4   FREESTYLE LITE test strip USE TO TEST BLOOD SUGAR ONCE A DAY. DX CODE: E11.9 100 strip 12   furosemide (LASIX) 40 MG tablet furosemide 40 mg tablet  TAKE 3 TABLETS BY MOUTH 2 (TWO) TIMES DAILY.     glimepiride (AMARYL) 1 MG tablet TAKE 1 TABLET BY MOUTH DAILY WITH BREAKFAST. 90 tablet 1   Lancets (FREESTYLE) lancets USE TWICE A DAY TO CHECK BLOOD SUGAR. DX E11.9 100 each 6   metFORMIN (GLUCOPHAGE) 1000 MG tablet TAKE 1 & 1/2 TABLETS BY MOUTH EVERY MORNING AND 1 TABLET IN THE EVENING. 225 tablet 1   metoprolol tartrate (LOPRESSOR) 25 MG tablet TAKE ONE-HALF TABLET BY MOUTH TWICE DAILY 90 tablet 3   nitroGLYCERIN (NITROSTAT) 0.4 MG SL tablet PLACE 1 TABLET (0.4 MG TOTAL) UNDER THE TONGUE EVERY 5 (FIVE) MINUTES AS NEEDED FOR CHEST PAIN. 25 tablet 6   polycarbophil (FIBERCON) 625 MG tablet Take 625 mg by mouth daily.     potassium chloride SA (KLOR-CON M20) 20 MEQ tablet Take 2 tablets (40 mEq total) by mouth 2 (two) times daily. 360 tablet 3   pregabalin (LYRICA) 150 MG capsule TAKE 1 CAPSULE BY MOUTH TWICE A DAY 180 capsule 1   psyllium (METAMUCIL) 58.6 % powder Take 1 packet by mouth daily as needed (constipation).     torsemide (DEMADEX) 20 MG tablet Take 20 mg by mouth daily.     VASCEPA 1 g capsule TAKE 2 CAPSULES BY MOUTH TWICE A DAY 120 capsule 5   No current facility-administered medications for this visit.    Facility-Administered Medications Ordered in Other Visits  Medication Dose Route Frequency Provider Last Rate Last Admin   sodium chloride flush (NS) 0.9 % injection 10 mL  10 mL Intravenous PRN [name], MD   10 mL at 05/30/17 0920    Allergies:  Benazepril, Hctz [hydrochlorothiazide], Aspirin, and Lactose intolerance (gi)   Social History: Social History   Socioeconomic History   Marital status: Divorced    Spouse name: Not on file   Number of children: 2   Years of education: college   Highest education level: Not on file  Occupational History    Employer: RETIRED  Tobacco Use   Smoking status: Never   Smokeless tobacco: Never  Vaping Use   Vaping Use: Never used  Substance and Sexual Activity   Alcohol use: No    Alcohol/week: 0.0 standard drinks    Comment: "last drink was in 2012"( 03/15/2014)   Drug use: No   Sexual activity: Not Currently  Other Topics Concern   Not on file  Social History Narrative   ** Merged History Encounter **       Divorced, lives with a roommate. 1 son one daughter 3-4 caffeinated beverages daily Right-handed. 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   Social Determinants of Health   Financial Resource Strain: Low Risk    Difficulty of Paying Living Expenses: Not hard at all  Food Insecurity: Not on file  Transportation Needs: Not on file  Physical Activity: Not on file  Stress: Not on file  Social Connections: Not on file  Intimate Partner Violence: Not on file    Family History: Family History  Problem Relation Age of Onset   Stroke Father    Hypertension Father    Pancreatic cancer Mother    Diabetes Maternal Grandmother    Stroke Maternal Grandmother    Heart attack Paternal Grandmother    Colon cancer Neg Hx    Esophageal cancer Neg Hx    Rectal cancer Neg Hx    Stomach cancer Neg  Hx    Ulcers Neg Hx      Review of Systems: All other systems reviewed and are otherwise negative except as noted above.  Physical Exam: Vitals:   02/06/21 1154  BP: 128/62  Pulse: 79  SpO2: 96%  Weight: 241 lb (109.3 kg)  Height: 6' (1.829 m)     GEN- The patient is well appearing, alert and oriented x 3 today.   HEENT: normocephalic, atraumatic; sclera clear, conjunctiva pink; hearing intact; oropharynx clear; neck supple  Lungs- Clear to ausculation bilaterally, normal work of breathing.  No wheezes, rales, rhonchi Heart- Regular rate and rhythm, no murmurs, rubs or gallops  GI- soft, non-tender, non-distended, bowel sounds present  Extremities- no clubbing or cyanosis. No edema MS- no significant deformity or atrophy Skin- warm and dry, no rash or lesion; PPM pocket well healed Psych- euthymic mood, full affect Neuro- strength and sensation are intact  PPM Interrogation- reviewed in detail today,  See PACEART report  EKG:  EKG is not ordered today.  Recent Labs: 01/19/2021: ALT 11; BUN 30; Creatinine 1.39; Hemoglobin 12.0; Platelet Count 182; Potassium 4.2; Sodium 141   Wt Readings from Last 3 Encounters:  02/06/21 241 lb (109.3 kg)  01/19/21 240 lb (108.9 kg)  01/04/21 245 lb (111.1 kg)     Other studies Reviewed: Additional studies/ records that were reviewed today include: Previous EP office notes, Previous remote checks, Most recent labwork.   Assessment and Plan:  Aortic stenosis status post TAVR  Denies symptoms of DOE, dizziness, lightheadedness, chest discomfort, or syncope. Followed by gen cards.    Atrial Fibrillation  persistent Burden <1% currently by device interrogation.  Continue eliquis for CHA2DS2-VASc of at least 6.  He continues to fluctuate around the 1.5 Cr limit of dose adjustment. .   Intermittent CHB s/p Pacemaker -Medtronic His bundle  Stable interrogation today. See PaceART He did not tolerate down-titration of his stable  thresholds. ? Less selective HIS capture   CAD s/p CABG  Denies symptoms of angina Follows with Dr. Percival Spanish and lipid clinic.    OSA on CPAP   Non Hodgkins Lymphoma Diffuse  chemos Cx 2019  Completed chemotherapy, still has port. Management per hem/onc .    Anemia Pernicious  Hgb 12.0 7/22     Leg Discomfort He reports his leg discomfort is consistent with chronic neuropathy  Symptoms remain stable. No claudication type history.   Left UE edema / likely subclavian stenosis/occlusion Intervention would likely require a staged procedure with angiography to occur first, and alone. Angioplasty would very likely interfere with pacemaker function. He is NOT dependent today, but in CHB in 30s intrinsically.  If intervention felt needed based on angiography, could consider stepwise and possible approach to possible include consideration of leadless pacemaker given R sided port. Will update Dr. Caryl Comes as well. Dr. Quentin Ore saw the patient with me today.   Current medicines are reviewed at length with the patient today.   The patient does not have concerns regarding his medicines.  The following changes were made today:  none  Labs/ tests ordered today include:  Orders Placed This Encounter  Procedures   CUP PACEART INCLINIC DEVICE CHECK   Disposition:   Follow up made with Dr. Caryl Comes in 3 months as placeholder. Plan pending angiography.   Jacalyn Lefevre, PA-C  02/06/2021 1:42 PM  [address] [address] [place] [address] [phone] (office) [phone] (fax)

## 2021-02-06 ENCOUNTER — Other Ambulatory Visit: Payer: Self-pay

## 2021-02-06 ENCOUNTER — Ambulatory Visit (INDEPENDENT_AMBULATORY_CARE_PROVIDER_SITE_OTHER): Payer: Medicare Other | Admitting: Student

## 2021-02-06 ENCOUNTER — Encounter: Payer: Self-pay | Admitting: Student

## 2021-02-06 VITALS — BP 128/62 | HR 79 | Ht 72.0 in | Wt 241.0 lb

## 2021-02-06 DIAGNOSIS — I25810 Atherosclerosis of coronary artery bypass graft(s) without angina pectoris: Secondary | ICD-10-CM | POA: Diagnosis not present

## 2021-02-06 DIAGNOSIS — I480 Paroxysmal atrial fibrillation: Secondary | ICD-10-CM | POA: Diagnosis not present

## 2021-02-06 DIAGNOSIS — I442 Atrioventricular block, complete: Secondary | ICD-10-CM | POA: Diagnosis not present

## 2021-02-06 DIAGNOSIS — Z951 Presence of aortocoronary bypass graft: Secondary | ICD-10-CM

## 2021-02-06 DIAGNOSIS — Z950 Presence of cardiac pacemaker: Secondary | ICD-10-CM | POA: Diagnosis not present

## 2021-02-06 LAB — CUP PACEART INCLINIC DEVICE CHECK
Date Time Interrogation Session: 20220809133252
Implantable Lead Implant Date: 20190313
Implantable Lead Implant Date: 20190313
Implantable Lead Location: 753859
Implantable Lead Location: 753860
Implantable Lead Model: 3830
Implantable Lead Model: 5076
Implantable Pulse Generator Implant Date: 20190313

## 2021-02-06 NOTE — Patient Instructions (Signed)
Medication Instructions:  Your physician recommends that you continue on your current medications as directed. Please refer to the Current Medication list given to you today.  *If you need a refill on your cardiac medications before your next appointment, please call your pharmacy*   Lab Work: None If you have labs (blood work) drawn today and your tests are completely normal, you will receive your results only by: MyChart Message (if you have MyChart) OR A paper copy in the mail If you have any lab test that is abnormal or we need to change your treatment, we will call you to review the results.  Follow-Up: At [facility], you and your health needs are our priority.  As part of our continuing mission to provide you with exceptional heart care, we have created designated Provider Care Teams.  These Care Teams include your primary Cardiologist (physician) and Advanced Practice Providers (APPs -  Physician Assistants and Nurse Practitioners) who all work together to provide you with the care you need, when you need it.   Your next appointment:   05/01/2021  The format for your next appointment:   In Person  Provider:   Steven Klein, MD  

## 2021-02-07 ENCOUNTER — Encounter (HOSPITAL_BASED_OUTPATIENT_CLINIC_OR_DEPARTMENT_OTHER): Payer: Medicare Other | Admitting: Physician Assistant

## 2021-02-07 DIAGNOSIS — E11622 Type 2 diabetes mellitus with other skin ulcer: Secondary | ICD-10-CM | POA: Diagnosis not present

## 2021-02-07 NOTE — Progress Notes (Signed)
AIKEN, WITHEM ([id number]) Visit Report for 02/07/2021 Arrival Information Details Patient Name: Date of Service: Richard [name] 02/07/2021 2:30 PM Medical Record Number: [id number] Patient Account Number: [id number] Date of Birth/Sex: Treating RN: 1941-06-25 (80 y.o. [name] Primary Care Phallon Haydu: Roma Schanz Other Clinician: Referring Brilee Port: Treating Jaleya Pebley/Extender: Jackelyn Knife in Treatment: 3 Visit Information History Since Last Visit Added or deleted any medications: No Patient Arrived: Ambulatory Any new allergies or adverse reactions: No Arrival Time: 14:17 Had a fall or experienced change in No Accompanied By: self activities of daily living that may affect Transfer Assistance: None risk of falls: Patient Identification Verified: Yes Signs or symptoms of abuse/neglect since last visito No Secondary Verification Process Completed: Yes Hospitalized since last visit: No Patient Requires Transmission-Based Precautions: No Implantable device outside of the clinic excluding No Patient Has Alerts: Yes cellular tissue based products placed in the center Patient Alerts: Patient on Blood Thinner since last visit: Has Dressing in Place as Prescribed: Yes Has Compression in Place as Prescribed: Yes Pain Present Now: No Electronic Signature(s) Signed: 02/07/2021 3:55:35 PM By: Baruch Gouty RN, BSN Entered By: Baruch Gouty on 02/07/2021 14:22:08 -------------------------------------------------------------------------------- Encounter Discharge Information Details Patient Name: Date of Service: Richard [name], Richard [name] [name] 02/07/2021 2:30 PM Medical Record Number: [id number] Patient Account Number: [id number] Date of Birth/Sex: Treating RN: 1941/01/28 (80 y.o. [name] Primary Care Wenceslao Loper: Roma Schanz Other Clinician: Referring Asani Mcburney: Treating Mariel Gaudin/Extender: Jackelyn Knife  in Treatment: 3 Encounter Discharge Information Items Post Procedure Vitals Discharge Condition: Stable Temperature (F): 98.2 Ambulatory Status: Ambulatory Pulse (bpm): 88 Discharge Destination: Home Respiratory Rate (breaths/min): 18 Transportation: Private Auto Blood Pressure (mmHg): 136/76 Accompanied By: self Schedule Follow-up Appointment: Yes Clinical Summary of Care: Patient Declined Electronic Signature(s) Signed: 02/07/2021 3:55:35 PM By: Baruch Gouty RN, BSN Entered By: Baruch Gouty on 02/07/2021 14:36:38 -------------------------------------------------------------------------------- Patient/Caregiver Education Details Patient Name: Date of Service: Richard [name] 8/10/2022andnbsp2:30 PM Medical Record Number: [id number] Patient Account Number: [id number] Date of Birth/Gender: Treating RN: [birth year] (80 y.o. [name] Primary Care Physician: Roma Schanz Other Clinician: Referring Physician: Treating Physician/Extender: Jackelyn Knife in Treatment: 3 Education Assessment Education Provided To: Patient Education Topics Provided Venous: Methods: Explain/Verbal Responses: Reinforcements needed, State content correctly Wound/Skin Impairment: Methods: Explain/Verbal Responses: Reinforcements needed, State content correctly Electronic Signature(s) Signed: 02/07/2021 3:55:35 PM By: Baruch Gouty RN, BSN Entered By: Baruch Gouty on 02/07/2021 14:35:56 -------------------------------------------------------------------------------- Wound Assessment Details Patient Name: Date of Service: Richard [name] [name] 02/07/2021 2:30 PM Medical Record Number: [id number] Patient Account Number: [id number] Date of Birth/Sex: Treating RN: 19-Feb-1941 (80 y.o. [name] Primary Care Dominiqua Cooner: Roma Schanz Other Clinician: Referring Jony Ladnier: Treating Aerik Polan/Extender: Jackelyn Knife in Treatment: 3 Wound Status Wound Number: 2 Primary Trauma, Other Etiology: Wound Location: Right, Posterior Lower Leg Wound Open Wounding Event: Trauma Status: Date Acquired: 12/24/2020 Comorbid Cataracts, Anemia, Sleep Apnea, Arrhythmia, Congestive Heart Weeks Of Treatment: 3 History: Failure, Coronary Artery Disease, Hypertension, Type II Diabetes, Clustered Wound: No Osteoarthritis, Neuropathy, Received Chemotherapy Wound Measurements Length: (cm) 3 Width: (cm) 3 Depth: (cm) 0.1 Area: (cm) 7.069 Volume: (cm) 0.707 % Reduction in Area: 48.6% % Reduction in Volume: 74.3% Epithelialization: Small (1-33%) Tunneling: No Undermining: No Wound Description Classification: Full Thickness Without Exposed Support Structures Wound Margin: Flat and Intact Exudate Amount: Medium Exudate Type: Serosanguineous Exudate Color: red, brown Wound Bed Granulation Amount: Medium (  34-66%) Granulation Quality: Red Necrotic Amount: Medium (34-66%) Necrotic Quality: Adherent Slough Foul Odor After Cleansing: No Slough/Fibrino Yes Exposed Structure Fascia Exposed: No Fat Layer (Subcutaneous Tissue) Exposed: Yes Tendon Exposed: No Muscle Exposed: No Joint Exposed: No Bone Exposed: No Treatment Notes Wound #2 (Lower Leg) Wound Laterality: Right, Posterior Cleanser Peri-Wound Care Sween Lotion (Moisturizing lotion) Discharge Instruction: Apply moisturizing lotion as directed Topical Primary Dressing Hydrofera Blue Ready Foam, 2.5 x2.5 in Discharge Instruction: Apply to wound bed as instructed Santyl Ointment Discharge Instruction: Apply nickel thick amount to wound bed as instructed Secondary Dressing Woven Gauze Sponge, Non-Sterile 4x4 in Discharge Instruction: Apply over primary dressing as directed. ABD Pad, 5x9 Discharge Instruction: Apply over primary dressing as directed. Secured With Compression Wrap Kerlix Roll 4.5x3.1 (in/yd) Discharge Instruction: Apply  Kerlix and Coban compression as directed. Coban Self-Adherent Wrap 4x5 (in/yd) Discharge Instruction: Apply over Kerlix as directed. Compression Stockings Add-Ons Electronic Signature(s) Signed: 02/07/2021 3:55:35 PM By: Baruch Gouty RN, BSN Entered By: Baruch Gouty on 02/07/2021 14:34:11 -------------------------------------------------------------------------------- Vitals Details Patient Name: Date of Service: Richard [name], Richard [name] [name] 02/07/2021 2:30 PM Medical Record Number: [id number] Patient Account Number: [id number] Date of Birth/Sex: Treating RN: 07-08-1940 (80 y.o. [name] Primary Care Javanni Maring: Roma Schanz Other Clinician: Referring Zyliah Schier: Treating Charlsie Fleeger/Extender: Jackelyn Knife in Treatment: 3 Vital Signs Time Taken: 14:22 Temperature (F): 98.2 Height (in): 72 Pulse (bpm): 88 Source: Stated Respiratory Rate (breaths/min): 18 Weight (lbs): 235 Blood Pressure (mmHg): 136/76 Source: Stated Capillary Blood Glucose (mg/dl): 108 Body Mass Index (BMI): 31.9 Reference Range: 80 - 120 mg / dl Notes glucose per pt report this am Electronic Signature(s) Signed: 02/07/2021 3:55:35 PM By: Baruch Gouty RN, BSN Entered By: Baruch Gouty on 02/07/2021 14:23:07

## 2021-02-08 ENCOUNTER — Other Ambulatory Visit: Payer: Self-pay | Admitting: Family Medicine

## 2021-02-08 DIAGNOSIS — G609 Hereditary and idiopathic neuropathy, unspecified: Secondary | ICD-10-CM

## 2021-02-08 NOTE — Telephone Encounter (Signed)
Requesting: [place] Contract: none UDS: none Last Visit: 01/04/21 Next Visit: none Last Refill: 08/07/20  Please Advise

## 2021-02-08 NOTE — Progress Notes (Signed)
KAITLYN, SKOWRON ([id number]) Visit Report for 02/07/2021 Debridement Details Patient Name: Date of Service: [name] 02/07/2021 2:30 PM Medical Record Number: [id number] Patient Account Number: [id number] Date of Birth/Sex: Treating RN: 27-Oct-1940 (80 y.o. [name] Primary Care Provider: Roma Schanz Other Clinician: Referring Provider: Treating Provider/Extender: Jackelyn Knife in Treatment: 3 Debridement Performed for Assessment: Wound #2 Right,Posterior Lower Leg Performed By: Clinician Baruch Gouty, RN Debridement Type: Chemical/Enzymatic/Mechanical Agent Used: Santyl Level of Consciousness (Pre-procedure): Awake and Alert Pre-procedure Verification/Time Out No Taken: Bleeding: None Response to Treatment: Procedure was tolerated well Level of Consciousness (Post- Awake and Alert procedure): Post Debridement Measurements of Total Wound Length: (cm) 3 Width: (cm) 3 Depth: (cm) 0.1 Volume: (cm) 0.707 Character of Wound/Ulcer Post Debridement: Improved Electronic Signature(s) Signed: 02/07/2021 3:55:35 PM By: Baruch Gouty RN, BSN Signed: 02/08/2021 5:47:57 PM By: Worthy Keeler PA-C Entered By: Baruch Gouty on 02/07/2021 14:34:53 -------------------------------------------------------------------------------- SuperBill Details Patient Name: Date of Service: [name], [place] L. 02/07/2021 Medical Record Number: [id number] Patient Account Number: [id number] Date of Birth/Sex: Treating RN: May 14, 1941 (80 y.o. [name] Primary Care Provider: Roma Schanz Other Clinician: Referring Provider: Treating Provider/Extender: Jackelyn Knife in Treatment: 3 Diagnosis Coding ICD-10 Codes Code Description [id number] Unspecified open wound, right lower leg, subsequent encounter S81.802D Unspecified open wound, left lower leg, subsequent encounter Facility Procedures CPT4 Code:  [id number] Description: 85631 - DEBRIDE W/O ANES NON SELECT Modifier: Quantity: 1 Electronic Signature(s) Signed: 02/07/2021 3:55:35 PM By: Baruch Gouty RN, BSN Signed: 02/08/2021 5:47:57 PM By: Worthy Keeler PA-C Entered By: Baruch Gouty on 02/07/2021 14:36:44

## 2021-02-10 ENCOUNTER — Other Ambulatory Visit: Payer: Self-pay | Admitting: Family Medicine

## 2021-02-12 ENCOUNTER — Telehealth: Payer: Self-pay

## 2021-02-12 ENCOUNTER — Ambulatory Visit: Payer: Medicare Other | Admitting: Surgery

## 2021-02-12 NOTE — H&P (View-Only) (Signed)
Referring Physician: Dr. Rushie Nyhan  Patient name: Richard [name] MRN: [id number] DOB: 11/09/1940 Sex: male  REASON FOR CONSULT: Left arm swelling  HPI: Richard DISE is a 80 y.o. male, with a 5 to 32-month history of increased left arm swelling.  His left arm is about 50% larger than his right arm.  He recently had an ulceration on his left hand and had some clear drainage from this.  He states the hand does have less swelling by the morning time if he elevates it while he is sleeping.  He has no prior history of DVT.  He does have a left-sided pacemaker with leads through the left subclavian vein.  He has also had axillary lymph nodes removed from the left side.  He has a right side Port-A-Cath.  The swelling is minimally bothersome to him.  He is still able to play golf and do his daily activities as he wishes.    02/13/21: Patient reports persistent left upper extremity swelling.  He saw his electrophysiologist who recommended central venogram before considering receding of pacemaker which seems to be working quite well for him.  Past Medical History:  Diagnosis Date   Anemia    Arthritis    hips   Axillary adenopathy 02/25/2017   Bradycardia    a. holter monitor has demonstrated HRs in 30s and [place]ink[place]ach    CAD (coronary artery disease)    a. s/p CABG 2001  b.  07/28/2017 cath:   Severe three-vessel native CAD with total occlusion of LAD, ramus intermedius, first OM and RCA, patent RIMA to PDA, LIMA to LAD, sequential SVG to ramus intermedius and first OM.     Chronic lower back pain    Diffuse large B cell lymphoma (HCC)    Diverticulosis    Esophageal stricture    GERD (gastroesophageal reflux disease)    History of gout    HTN (hypertension)    Mixed hyperlipidemia    OSA on CPAP    with 2L O2 at night   Pancytopenia ([place])    a. related to chemo therapy for B cell lymphoma   Peptic stricture of esophagus    Presence of permanent cardiac pacemaker    sees Dr. Valaria Good  pacemaker   SCC (squamous cell carcinoma) 01/31/2021   R zygoma, [facility]   SCC (squamous cell carcinoma) 01/31/2021   L post ankle, [facility]   SCC (squamous cell carcinoma) 01/31/2021   L popliteal, [facility]   Severe aortic stenosis    Spinal stenosis    Squamous cell carcinoma of skin 03/22/2009   Right mandible. SCCis, hypertrophic.    Type II diabetes mellitus ([place])    Wears dentures    partial upper   Past Surgical History:  Procedure Laterality Date   APPENDECTOMY  ~ [place] Left 11/06/2016   Procedure: CATARACT EXTRACTION PHACO AND INTRAOCULAR LENS PLACEMENT (IOC);  Surgeon: Estill Cotta, MD;  Location: [facility];  Service: Ophthalmology;  Laterality: Left;  Lot # [id number] H [place]: 01:09.4 AP%:25.2 CDE: 30.64   CATARACT EXTRACTION W/PHACO Right 12/04/2016   Procedure: CATARACT EXTRACTION PHACO AND INTRAOCULAR LENS PLACEMENT (IOC);  Surgeon: Estill Cotta, MD;  Location: [facility];  Service: Ophthalmology;  Laterality: Right;  [place] 1:25.9 AP% 24.1 CDE 39.10 Fluid Pack lot # [id number]   COLONOSCOPY W/ BIOPSIES AND POLYPECTOMY  2013   CORONARY ANGIOPLASTY  1993   CORONARY ANGIOPLASTY WITH STENT PLACEMENT  05/1997   "  1"   CORONARY ARTERY BYPASS GRAFT  03/2000   "CABG X5"   ECTROPION REPAIR Right 09/01/2018   Procedure: REPAIR OF ECTROPION BILATERAL upper and lower;  Surgeon: Karle Starch, MD;  Location: [place];  Service: Ophthalmology;  Laterality: Right;  Diabetic - oral meds sleep apnea   ESOPHAGEAL DILATION  X 3-4   Dr. Lyla Son; "last one was in the 1990's"   ESOPHAGOGASTRODUODENOSCOPY     multiple   FLEXIBLE SIGMOIDOSCOPY     multiple   HYDRADENITIS EXCISION Left 02/25/2017   Procedure: EXCISION DEEP LEFT AXILLARY LYMPH NODE;  Surgeon: Fanny Skates, MD;  Location: [place];  Service: General;  Laterality: Left;   INTRAOPERATIVE TRANSTHORACIC ECHOCARDIOGRAM N/A 09/09/2017   Procedure: INTRAOPERATIVE TRANSTHORACIC  ECHOCARDIOGRAM;  Surgeon: Burnell Blanks, MD;  Location: [place];  Service: Open Heart Surgery;  Laterality: N/A;   KNEE ARTHROSCOPY Left 2011   meniscus repair   LEFT HEART CATHETERIZATION WITH CORONARY/GRAFT ANGIOGRAM N/A 03/16/2014   Procedure: LEFT HEART CATHETERIZATION WITH Beatrix Fetters;  Surgeon: Burnell Blanks, MD;  [facility];  Service: Cardiovascular;  Laterality: N/A;   LUMBAR LAMINECTOMY/DECOMPRESSION MICRODISCECTOMY Right 06/17/2013   Procedure: LUMBAR LAMINECTOMY MICRODISCECTOMY L4-L5 RIGHT EXCISION OF SYNOVIAL CYST RIGHT   (1 LEVEL) RIGHT PARTIAL FACETECTOMY;  Surgeon: Tobi Bastos, MD;  Location: [facility];  Service: Orthopedics;  Laterality: Right;   MYELOGRAM  04/06/13   lumbar, Dr Gladstone Lighter   ORBITAL LESION EXCISION Right 09/01/2018   Procedure: ORBITOTOMY WITHOUT BONE FLAP WITH REMOVAL OF LESION RIGHT;  Surgeon: Karle Starch, MD;  Location: [place];  Service: Ophthalmology;  Laterality: Right;   PACEMAKER IMPLANT N/A 09/10/2017   Procedure: PACEMAKER IMPLANT;  Surgeon: Deboraha Sprang, MD;  Location: [place] CV LAB;  Service: Cardiovascular;  Laterality: N/A;   PANENDOSCOPY     PORTACATH PLACEMENT N/A 03/06/2017   Procedure: INSERTION PORT-A-CATH AND ASPIRATE SEROMA LEFT AXILLA;  Surgeon: Fanny Skates, MD;  Location: [place];  Service: General;  Laterality: N/A;   RIGHT/LEFT HEART CATH AND CORONARY/GRAFT ANGIOGRAPHY N/A 07/28/2017   Procedure: RIGHT/LEFT HEART CATH AND CORONARY/GRAFT ANGIOGRAPHY;  Surgeon: Sherren Mocha, MD;  Location: [place] CV LAB;  Service: Cardiovascular;  Laterality: N/A;   SHOULDER SURGERY Right 08/2010   screws placed; "tendons tore off"   SKIN CANCER EXCISION Right    "neck"   TONSILLECTOMY  ~ [place] Left 06/29/2019   Procedure: TOTAL KNEE ARTHROPLASTY;  Surgeon: Paralee Cancel, MD;  Location: [facility];  Service: Orthopedics;  Laterality: Left;  70 mins   TRANSCATHETER AORTIC  VALVE REPLACEMENT, TRANSFEMORAL N/A 09/09/2017   Procedure: TRANSCATHETER AORTIC VALVE REPLACEMENT, TRANSFEMORAL;  Surgeon: Burnell Blanks, MD;  Location: [place];  Service: Open Heart Surgery;  Laterality: N/A;   UPPER GI ENDOSCOPY  2013   Gastritis; Dr Carlean Purl   VASECTOMY      Family History  Problem Relation Age of Onset   Stroke Father    Hypertension Father    Pancreatic cancer Mother    Diabetes Maternal Grandmother    Stroke Maternal Grandmother    Heart attack Paternal Grandmother    Colon cancer Neg Hx    Esophageal cancer Neg Hx    Rectal cancer Neg Hx    Stomach cancer Neg Hx    Ulcers Neg Hx     SOCIAL HISTORY: Social History   Socioeconomic History   Marital status: Divorced    Spouse name:  Not on file   Number of children: 2   Years of education: college   Highest education level: Not on file  Occupational History    Employer: RETIRED  Tobacco Use   Smoking status: Never   Smokeless tobacco: Never  Vaping Use   Vaping Use: Never used  Substance and Sexual Activity   Alcohol use: No    Alcohol/week: 0.0 standard drinks    Comment: "last drink was in 2012"( 03/15/2014)   Drug use: No   Sexual activity: Not Currently  Other Topics Concern   Not on file  Social History Narrative   ** Merged History Encounter **       Divorced, lives with a roommate. 1 son one daughter 3-4 caffeinated beverages daily Right-handed. 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   Social Determinants of Health   Financial Resource Strain: Low Risk    Difficulty of Paying Living Expenses: Not hard at all  Food Insecurity: Not on file  Transportation Needs: Not on file  Physical Activity: Not on file  Stress: Not on file  Social Connections: Not on file  Intimate Partner Violence: Not on file    Allergies  Allergen Reactions   Benazepril  Swelling    angioedema; he is not a candidate for any angiotensin receptor blockers because of this significant allergic reaction. Because of a history of documented adverse serious drug reaction;Medi Alert bracelet  is recommended   Hctz [Hydrochlorothiazide] Anaphylaxis and Swelling    Tongue and lip swelling    Aspirin Other (See Comments)    Gastritis, cant take 325 Mg aspirin    Lactose Intolerance (Gi) Nausea And Vomiting    Current Outpatient Medications  Medication Sig Dispense Refill   Acetaminophen (TYLENOL) 325 MG CAPS Tylenol     allopurinol (ZYLOPRIM) 300 MG tablet Take 450 mg by mouth daily.     aluminum hydroxide-magnesium carbonate (GAVISCON) 95-358 MG/15ML SUSP Take 15 mLs by mouth as needed for indigestion or heartburn.     amLODipine (NORVASC) 5 MG tablet TAKE 1 TABLET BY MOUTH TWICE A DAY 180 tablet 3   apixaban (ELIQUIS) 5 MG TABS tablet Take 1 tablet (5 mg total) by mouth 2 (two) times daily. 180 tablet 1   atorvastatin (LIPITOR) 80 MG tablet TAKE ONE TABLET BY MOUTH AT BEDTIME 90 tablet 3   azelastine (ASTELIN) 0.1 % nasal spray Place 2 sprays into both nostrils at bedtime as needed for rhinitis or allergies. 30 mL 12   dapagliflozin propanediol (FARXIGA) 10 MG TABS tablet Take by mouth.     esomeprazole (NEXIUM) 40 MG capsule Take 1 capsule (40 mg total) by mouth daily. 30 capsule 5   ezetimibe (ZETIA) 10 MG tablet TAKE 1 TABLET BY MOUTH EVERY DAY 90 tablet 3   fenofibrate 160 MG tablet TAKE 1 TABLET BY MOUTH EVERY DAY 90 tablet 0   folic acid (FOLVITE) 1 MG tablet TAKE 2 TABLETS BY MOUTH EVERY DAY 180 tablet 4   FREESTYLE LITE test strip USE TO TEST BLOOD SUGAR ONCE A DAY. DX CODE: E11.9 100 strip 12   furosemide (LASIX) 40 MG tablet furosemide 40 mg tablet  TAKE 3 TABLETS BY MOUTH 2 (TWO) TIMES DAILY.     glimepiride (AMARYL) 1 MG tablet TAKE 1 TABLET BY MOUTH DAILY WITH BREAKFAST. 90 tablet 1   Lancets (FREESTYLE) lancets USE TWICE  A DAY TO CHECK BLOOD SUGAR.  DX E11.9 100 each 6   metFORMIN (GLUCOPHAGE) 1000 MG tablet TAKE 1 & 1/2 TABLETS BY MOUTH EVERY MORNING AND 1 TABLET IN THE EVENING. 225 tablet 1   metoprolol tartrate (LOPRESSOR) 25 MG tablet TAKE ONE-HALF TABLET BY MOUTH TWICE DAILY 90 tablet 3   nitroGLYCERIN (NITROSTAT) 0.4 MG SL tablet PLACE 1 TABLET (0.4 MG TOTAL) UNDER THE TONGUE EVERY 5 (FIVE) MINUTES AS NEEDED FOR CHEST PAIN. 25 tablet 6   polycarbophil (FIBERCON) 625 MG tablet Take 625 mg by mouth daily.     potassium chloride SA (KLOR-CON M20) 20 MEQ tablet Take 2 tablets (40 mEq total) by mouth 2 (two) times daily. 360 tablet 3   pregabalin (LYRICA) 150 MG capsule TAKE 1 CAPSULE BY MOUTH TWICE A DAY 180 capsule 0   psyllium (METAMUCIL) 58.6 % powder Take 1 packet by mouth daily as needed (constipation).     torsemide (DEMADEX) 20 MG tablet Take 20 mg by mouth daily.     VASCEPA 1 g capsule TAKE 2 CAPSULES BY MOUTH TWICE A DAY 120 capsule 5   No current facility-administered medications for this visit.   Facility-Administered Medications Ordered in Other Visits  Medication Dose Route Frequency Provider Last Rate Last Admin   sodium chloride flush (NS) 0.9 % injection 10 mL  10 mL Intravenous PRN [name], MD   10 mL at 05/30/17 0920    ROS:   General:  No weight loss, Fever, chills  HEENT: No recent headaches, no nasal bleeding, no visual changes, no sore throat  Neurologic: No dizziness, blackouts, seizures. No recent symptoms of stroke or mini- stroke. No recent episodes of slurred speech, or temporary blindness.  Cardiac: No recent episodes of chest pain/pressure, no shortness of breath at rest.  No shortness of breath with exertion.  Denies history of atrial fibrillation or irregular heartbeat  Vascular: No history of rest pain in feet.  No history of claudication.  No history of non-healing ulcer, No history of DVT   Pulmonary: No home oxygen, no productive cough, no hemoptysis,  No asthma or  wheezing  Musculoskeletal:  [ ]  Arthritis, [ ]  Low back pain,  [ ]  Joint pain  Hematologic:No history of hypercoagulable state.  No history of easy bleeding.  No history of anemia  Gastrointestinal: No hematochezia or melena,  No gastroesophageal reflux, no trouble swallowing  Urinary: [ ]  chronic Kidney disease, [ ]  on HD - [ ]  MWF or [ ]  TTHS, [ ]  Burning with urination, [ ]  Frequent urination, [ ]  Difficulty urinating;   Skin: No rashes  Psychological: No history of anxiety,  No history of depression   Physical Examination  Vitals:   02/13/21 1521  BP: 136/78  Pulse: 77  Resp: 20  Temp: 98.3 F (36.8 C)  SpO2: 95%  Weight: 241 lb (109.3 kg)  Height: 6' (1.829 m)     Body mass index is 32.69 kg/m.  General:  Alert and oriented, no acute distress HEENT: Normal Neck: No bruit or JVD Pulmonary: Clear to auscultation bilaterally, large network of chest wall collaterals extending up into the left neck and left anterior chest, left side pacemaker, right side Port-A-Cath Cardiac: Regular Rate and Rhythm  Abdomen: Soft, non-tender, non-distended, no mass, no scars Skin: No rash Extremity Pulses:  2+ radial, brachial pulses bilaterally Musculoskeletal: No deformity diffuse left upper extremity edema approximately 50% larger than the right arm Neurologic: Upper and lower extremity motor 5/5  and symmetric  DATA:  Patient recently had a left upper extremity DVT ultrasound which was negative.  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  Recent CT scan was nondiagnostic.  Electrophysiology team reasonably requesting venogram prior to considering repositioning the pacemaker.  PLAN: Left upper extremity and central venogram 02/15/2021  [name]. Stanford Breed, MD [facility] Phone Number: [phone] 02/13/2021 4:53 PM

## 2021-02-12 NOTE — Progress Notes (Signed)
Referring Physician: Dr. Rushie Nyhan  Patient name: Richard [name] MRN: [id number] DOB: 10-25-1940 Sex: male  REASON FOR CONSULT: Left arm swelling  HPI: Richard [name] is a 80 y.o. male, with a 5 to 65-month history of increased left arm swelling.  His left arm is about 50% larger than his right arm.  He recently had an ulceration on his left hand and had some clear drainage from this.  He states the hand does have less swelling by the morning time if he elevates it while he is sleeping.  He has no prior history of DVT.  He does have a left-sided pacemaker with leads through the left subclavian vein.  He has also had axillary lymph nodes removed from the left side.  He has a right side Port-A-Cath.  The swelling is minimally bothersome to him.  He is still able to play golf and do his daily activities as he wishes.    02/13/21: Patient reports persistent left upper extremity swelling.  He saw his electrophysiologist who recommended central venogram before considering receding of pacemaker which seems to be working quite well for him.  Past Medical History:  Diagnosis Date   Anemia    Arthritis    hips   Axillary adenopathy 02/25/2017   Bradycardia    a. holter monitor has demonstrated HRs in 30s and Weinkibach    CAD (coronary artery disease)    a. s/p CABG 2001  b.  07/28/2017 cath:   Severe three-vessel native CAD with total occlusion of LAD, ramus intermedius, first OM and RCA, patent RIMA to PDA, LIMA to LAD, sequential SVG to ramus intermedius and first OM.     Chronic lower back pain    Diffuse large B cell lymphoma (HCC)    Diverticulosis    Esophageal stricture    GERD (gastroesophageal reflux disease)    History of gout    HTN (hypertension)    Mixed hyperlipidemia    OSA on CPAP    with 2L O2 at night   Pancytopenia ([place])    a. related to chemo therapy for B cell lymphoma   Peptic stricture of esophagus    Presence of permanent cardiac pacemaker    sees Dr. Valaria Good  pacemaker   SCC (squamous cell carcinoma) 01/31/2021   R zygoma, EDC   SCC (squamous cell carcinoma) 01/31/2021   L post ankle, EDC   SCC (squamous cell carcinoma) 01/31/2021   L popliteal, EDC   Severe aortic stenosis    Spinal stenosis    Squamous cell carcinoma of skin 03/22/2009   Right mandible. SCCis, hypertrophic.    Type II diabetes mellitus ([place])    Wears dentures    partial upper   Past Surgical History:  Procedure Laterality Date   APPENDECTOMY  ~ [place] Left 11/06/2016   Procedure: CATARACT EXTRACTION PHACO AND INTRAOCULAR LENS PLACEMENT (IOC);  Surgeon: Estill Cotta, MD;  Location: [facility];  Service: Ophthalmology;  Laterality: Left;  Lot # [id number] H [place]: 01:09.4 AP%:25.2 CDE: 30.64   CATARACT EXTRACTION W/PHACO Right 12/04/2016   Procedure: CATARACT EXTRACTION PHACO AND INTRAOCULAR LENS PLACEMENT (IOC);  Surgeon: Estill Cotta, MD;  Location: [facility];  Service: Ophthalmology;  Laterality: Right;  [place] 1:25.9 AP% 24.1 CDE 39.10 Fluid Pack lot # [id number]   COLONOSCOPY W/ BIOPSIES AND POLYPECTOMY  2013   CORONARY ANGIOPLASTY  1993   CORONARY ANGIOPLASTY WITH STENT PLACEMENT  05/1997   "  1"   CORONARY ARTERY BYPASS GRAFT  03/2000   "CABG X5"   ECTROPION REPAIR Right 09/01/2018   Procedure: REPAIR OF ECTROPION BILATERAL upper and lower;  Surgeon: Karle Starch, MD;  Location: [place];  Service: Ophthalmology;  Laterality: Right;  Diabetic - oral meds sleep apnea   ESOPHAGEAL DILATION  X 3-4   Dr. Lyla Son; "last one was in the 1990's"   ESOPHAGOGASTRODUODENOSCOPY     multiple   FLEXIBLE SIGMOIDOSCOPY     multiple   HYDRADENITIS EXCISION Left 02/25/2017   Procedure: EXCISION DEEP LEFT AXILLARY LYMPH NODE;  Surgeon: Fanny Skates, MD;  Location: [place];  Service: General;  Laterality: Left;   INTRAOPERATIVE TRANSTHORACIC ECHOCARDIOGRAM N/A 09/09/2017   Procedure: INTRAOPERATIVE TRANSTHORACIC  ECHOCARDIOGRAM;  Surgeon: Burnell Blanks, MD;  Location: [place];  Service: Open Heart Surgery;  Laterality: N/A;   KNEE ARTHROSCOPY Left 2011   meniscus repair   LEFT HEART CATHETERIZATION WITH CORONARY/GRAFT ANGIOGRAM N/A 03/16/2014   Procedure: LEFT HEART CATHETERIZATION WITH Beatrix Fetters;  Surgeon: Burnell Blanks, MD;  Location: [facility];  Service: Cardiovascular;  Laterality: N/A;   LUMBAR LAMINECTOMY/DECOMPRESSION MICRODISCECTOMY Right 06/17/2013   Procedure: LUMBAR LAMINECTOMY MICRODISCECTOMY L4-L5 RIGHT EXCISION OF SYNOVIAL CYST RIGHT   (1 LEVEL) RIGHT PARTIAL FACETECTOMY;  Surgeon: Tobi Bastos, MD;  Location: [facility];  Service: Orthopedics;  Laterality: Right;   MYELOGRAM  04/06/13   lumbar, Dr Gladstone Lighter   ORBITAL LESION EXCISION Right 09/01/2018   Procedure: ORBITOTOMY WITHOUT BONE FLAP WITH REMOVAL OF LESION RIGHT;  Surgeon: Karle Starch, MD;  Location: [place];  Service: Ophthalmology;  Laterality: Right;   PACEMAKER IMPLANT N/A 09/10/2017   Procedure: PACEMAKER IMPLANT;  Surgeon: Deboraha Sprang, MD;  Location: [place] [facility];  Service: Cardiovascular;  Laterality: N/A;   PANENDOSCOPY     PORTACATH PLACEMENT N/A 03/06/2017   Procedure: INSERTION PORT-A-CATH AND ASPIRATE SEROMA LEFT AXILLA;  Surgeon: Fanny Skates, MD;  Location: [place];  Service: General;  Laterality: N/A;   RIGHT/LEFT HEART CATH AND CORONARY/GRAFT ANGIOGRAPHY N/A 07/28/2017   Procedure: RIGHT/LEFT HEART CATH AND CORONARY/GRAFT ANGIOGRAPHY;  Surgeon: Sherren Mocha, MD;  Location: [place] [facility];  Service: Cardiovascular;  Laterality: N/A;   SHOULDER SURGERY Right 08/2010   screws placed; "tendons tore off"   SKIN CANCER EXCISION Right    "neck"   TONSILLECTOMY  ~ [place] Left 06/29/2019   Procedure: TOTAL KNEE ARTHROPLASTY;  Surgeon: Paralee Cancel, MD;  Location: [facility];  Service: Orthopedics;  Laterality: Left;  70 mins   TRANSCATHETER AORTIC  VALVE REPLACEMENT, TRANSFEMORAL N/A 09/09/2017   Procedure: TRANSCATHETER AORTIC VALVE REPLACEMENT, TRANSFEMORAL;  Surgeon: Burnell Blanks, MD;  Location: [place];  Service: Open Heart Surgery;  Laterality: N/A;   UPPER GI ENDOSCOPY  2013   Gastritis; Dr Carlean Purl   VASECTOMY      Family History  Problem Relation Age of Onset   Stroke Father    Hypertension Father    Pancreatic cancer Mother    Diabetes Maternal Grandmother    Stroke Maternal Grandmother    Heart attack Paternal Grandmother    Colon cancer Neg Hx    Esophageal cancer Neg Hx    Rectal cancer Neg Hx    Stomach cancer Neg Hx    Ulcers Neg Hx     SOCIAL HISTORY: Social History   Socioeconomic History   Marital status: Divorced    Spouse name:  Not on file   Number of children: 2   Years of education: college   Highest education level: Not on file  Occupational History    Employer: RETIRED  Tobacco Use   Smoking status: Never   Smokeless tobacco: Never  Vaping Use   Vaping Use: Never used  Substance and Sexual Activity   Alcohol use: No    Alcohol/week: 0.0 standard drinks    Comment: "last drink was in 2012"( 03/15/2014)   Drug use: No   Sexual activity: Not Currently  Other Topics Concern   Not on file  Social History Narrative   ** Merged History Encounter **       Divorced, lives with a roommate. 1 son one daughter 3-4 caffeinated beverages daily Right-handed. 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   Social Determinants of Health   Financial Resource Strain: Low Risk    Difficulty of Paying Living Expenses: Not hard at all  Food Insecurity: Not on file  Transportation Needs: Not on file  Physical Activity: Not on file  Stress: Not on file  Social Connections: Not on file  Intimate Partner Violence: Not on file    Allergies  Allergen Reactions   Benazepril  Swelling    angioedema; he is not a candidate for any angiotensin receptor blockers because of this significant allergic reaction. Because of a history of documented adverse serious drug reaction;Medi Alert bracelet  is recommended   Hctz [Hydrochlorothiazide] Anaphylaxis and Swelling    Tongue and lip swelling    Aspirin Other (See Comments)    Gastritis, cant take 325 Mg aspirin    Lactose Intolerance (Gi) Nausea And Vomiting    Current Outpatient Medications  Medication Sig Dispense Refill   Acetaminophen (TYLENOL) 325 MG CAPS Tylenol     allopurinol (ZYLOPRIM) 300 MG tablet Take 450 mg by mouth daily.     aluminum hydroxide-magnesium carbonate (GAVISCON) 95-358 MG/15ML SUSP Take 15 mLs by mouth as needed for indigestion or heartburn.     amLODipine (NORVASC) 5 MG tablet TAKE 1 TABLET BY MOUTH TWICE A DAY 180 tablet 3   apixaban (ELIQUIS) 5 MG TABS tablet Take 1 tablet (5 mg total) by mouth 2 (two) times daily. 180 tablet 1   atorvastatin (LIPITOR) 80 MG tablet TAKE ONE TABLET BY MOUTH AT BEDTIME 90 tablet 3   azelastine (ASTELIN) 0.1 % nasal spray Place 2 sprays into both nostrils at bedtime as needed for rhinitis or allergies. 30 mL 12   dapagliflozin propanediol (FARXIGA) 10 MG TABS tablet Take by mouth.     esomeprazole (NEXIUM) 40 MG capsule Take 1 capsule (40 mg total) by mouth daily. 30 capsule 5   ezetimibe (ZETIA) 10 MG tablet TAKE 1 TABLET BY MOUTH EVERY DAY 90 tablet 3   fenofibrate 160 MG tablet TAKE 1 TABLET BY MOUTH EVERY DAY 90 tablet 0   folic acid (FOLVITE) 1 MG tablet TAKE 2 TABLETS BY MOUTH EVERY DAY 180 tablet 4   FREESTYLE LITE test strip USE TO TEST BLOOD SUGAR ONCE A DAY. DX CODE: E11.9 100 strip 12   furosemide (LASIX) 40 MG tablet furosemide 40 mg tablet  TAKE 3 TABLETS BY MOUTH 2 (TWO) TIMES DAILY.     glimepiride (AMARYL) 1 MG tablet TAKE 1 TABLET BY MOUTH DAILY WITH BREAKFAST. 90 tablet 1   Lancets (FREESTYLE) lancets USE TWICE  A DAY TO CHECK BLOOD SUGAR.  DX E11.9 100 each 6   metFORMIN (GLUCOPHAGE) 1000 MG tablet TAKE 1 & 1/2 TABLETS BY MOUTH EVERY MORNING AND 1 TABLET IN THE EVENING. 225 tablet 1   metoprolol tartrate (LOPRESSOR) 25 MG tablet TAKE ONE-HALF TABLET BY MOUTH TWICE DAILY 90 tablet 3   nitroGLYCERIN (NITROSTAT) 0.4 MG SL tablet PLACE 1 TABLET (0.4 MG TOTAL) UNDER THE TONGUE EVERY 5 (FIVE) MINUTES AS NEEDED FOR CHEST PAIN. 25 tablet 6   polycarbophil (FIBERCON) 625 MG tablet Take 625 mg by mouth daily.     potassium chloride SA (KLOR-CON M20) 20 MEQ tablet Take 2 tablets (40 mEq total) by mouth 2 (two) times daily. 360 tablet 3   pregabalin (LYRICA) 150 MG capsule TAKE 1 CAPSULE BY MOUTH TWICE A DAY 180 capsule 0   psyllium (METAMUCIL) 58.6 % powder Take 1 packet by mouth daily as needed (constipation).     torsemide (DEMADEX) 20 MG tablet Take 20 mg by mouth daily.     VASCEPA 1 g capsule TAKE 2 CAPSULES BY MOUTH TWICE A DAY 120 capsule 5   No current facility-administered medications for this visit.   Facility-Administered Medications Ordered in Other Visits  Medication Dose Route Frequency Provider Last Rate Last Admin   sodium chloride flush (NS) 0.9 % injection 10 mL  10 mL Intravenous PRN [name], MD   10 mL at 05/30/17 0920    ROS:   General:  No weight loss, Fever, chills  HEENT: No recent headaches, no nasal bleeding, no visual changes, no sore throat  Neurologic: No dizziness, blackouts, seizures. No recent symptoms of stroke or mini- stroke. No recent episodes of slurred speech, or temporary blindness.  Cardiac: No recent episodes of chest pain/pressure, no shortness of breath at rest.  No shortness of breath with exertion.  Denies history of atrial fibrillation or irregular heartbeat  Vascular: No history of rest pain in feet.  No history of claudication.  No history of non-healing ulcer, No history of DVT   Pulmonary: No home oxygen, no productive cough, no hemoptysis,  No asthma or  wheezing  Musculoskeletal:  [ ]  Arthritis, [ ]  Low back pain,  [ ]  Joint pain  Hematologic:No history of hypercoagulable state.  No history of easy bleeding.  No history of anemia  Gastrointestinal: No hematochezia or melena,  No gastroesophageal reflux, no trouble swallowing  Urinary: [ ]  chronic Kidney disease, [ ]  on HD - [ ]  MWF or [ ]  TTHS, [ ]  Burning with urination, [ ]  Frequent urination, [ ]  Difficulty urinating;   Skin: No rashes  Psychological: No history of anxiety,  No history of depression   Physical Examination  Vitals:   02/13/21 1521  BP: 136/78  Pulse: 77  Resp: 20  Temp: 98.3 F (36.8 C)  SpO2: 95%  Weight: 241 lb (109.3 kg)  Height: 6' (1.829 m)     Body mass index is 32.69 kg/m.  General:  Alert and oriented, no acute distress HEENT: Normal Neck: No bruit or JVD Pulmonary: Clear to auscultation bilaterally, large network of chest wall collaterals extending up into the left neck and left anterior chest, left side pacemaker, right side Port-A-Cath Cardiac: Regular Rate and Rhythm  Abdomen: Soft, non-tender, non-distended, no mass, no scars Skin: No rash Extremity Pulses:  2+ radial, brachial pulses bilaterally Musculoskeletal: No deformity diffuse left upper extremity edema approximately 50% larger than the right arm Neurologic: Upper and lower extremity motor 5/5  and symmetric  DATA:  Patient recently had a left upper extremity DVT ultrasound which was negative.  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  Recent CT scan was nondiagnostic.  Electrophysiology team reasonably requesting venogram prior to considering repositioning the pacemaker.  PLAN: Left upper extremity and central venogram 02/15/2021  [name]. Stanford Breed, MD [facility] Phone Number: [phone] 02/13/2021 4:53 PM

## 2021-02-12 NOTE — Telephone Encounter (Signed)
Advised pt of bx results/sh ?

## 2021-02-12 NOTE — Telephone Encounter (Signed)
-----   Message from Brendolyn Patty, MD sent at 02/12/2021  9:00 AM EDT ----- 1. Skin , right zygoma WELL DIFFERENTIATED SQUAMOUS CELL CARCINOMA, BASE INVOLVED 2. Skin , left posterior ankle WELL DIFFERENTIATED SQUAMOUS CELL CARCINOMA, BASE INVOLVED 3. Skin , left popliteal WELL DIFFERENTIATED SQUAMOUS CELL CARCINOMA, BASE INVOLVED  SCC skin cancer- already treated with EDC at time of biopsy, will observe for recurrence   - please call patient

## 2021-02-13 ENCOUNTER — Other Ambulatory Visit: Payer: Self-pay

## 2021-02-13 ENCOUNTER — Ambulatory Visit (INDEPENDENT_AMBULATORY_CARE_PROVIDER_SITE_OTHER): Payer: Medicare Other | Admitting: Vascular Surgery

## 2021-02-13 ENCOUNTER — Encounter: Payer: Self-pay | Admitting: Vascular Surgery

## 2021-02-13 VITALS — BP 136/78 | HR 77 | Temp 98.3°F | Resp 20 | Ht 72.0 in | Wt 241.0 lb

## 2021-02-13 DIAGNOSIS — R600 Localized edema: Secondary | ICD-10-CM

## 2021-02-13 DIAGNOSIS — I25810 Atherosclerosis of coronary artery bypass graft(s) without angina pectoris: Secondary | ICD-10-CM

## 2021-02-14 ENCOUNTER — Telehealth: Payer: Self-pay | Admitting: Cardiology

## 2021-02-14 NOTE — Telephone Encounter (Signed)
Left message to call back  

## 2021-02-14 NOTE — Telephone Encounter (Signed)
Pt c/o medication issue:  1. Name of Medication: apixaban (ELIQUIS) 5 MG TABS tablet  2. How are you currently taking this medication (dosage and times per day)? 2.5 mg twice daily   3. Are you having a reaction (difficulty breathing--STAT)?   4. What is your medication issue? Patient wants to make sure he is on the correct dose of this medication.  He was not sure if the dosage was changed or not. Please advise

## 2021-02-14 NOTE — Telephone Encounter (Signed)
Patient returning call.

## 2021-02-14 NOTE — Telephone Encounter (Signed)
Spoke with patient and reviewed instructions below from 01/17/2021 telephone note  Hemphill, Myrtis Hopping, RN at 01/17/2021  9:26 AM  Status: Signed  Called pt since message was received from Dr. Percival Spanish to increase pt's Eliquis dose to 5mg  BID from 2.5mg  BID; spoke with pt and updated him and he verbalized understanding. He is aware to take Eliquis 5mg  twice a day and the reason for the dose increase.    Called the [facility] and spoke with Gaspar Bidding, the pharmacist, and instructed him of the dose change and the appropriate refill was being sent. Also, advised him to remove the Eliquis 2.5mg  tablet off med list to avoid discrepancies    Denied the Eliquis 2.5mg  refill and removed Eliquis 2.5mg  off med list at this time.    Patient is 80 years old, weight-111.1kg, Crea-1.35 via [place] on 12/26/20 from [facility] and 1.51 on 12/20/20 per Epic, Diagnosis-Afib/flutter, and last seen by Dr. Caryl Comes on 11/24/2020 and Dr. Percival Spanish on 11/21/20.    Received: 3 days ago Minus Breeding, MD  Brynda Peon, RN Many creatinine levels have been above 1.5 but it does look like it is trending down.  OK to increase to 5 mg bid.           Previous Messages    ----- Message -----  From: Brynda Peon, RN  Sent: 01/09/2021   1:43 PM EDT  To: Minus Breeding, MD   Pt last saw Dr Percival Spanish 11/21/20, last labs 12/26/20 Creat 1.35, age 22, weight 111.1kg, based on most recent serum creat pt is not on appropriate dosage of Eliquis however pt's creat fluctuates and has been >1.5 as recent as 12/20/20.  Discussed with Marcelle Overlie, pharmacist requested message be sent to Dr Percival Spanish to inquire if he thinks dose change is appropriate, or if he would like pt to continue on Eliquis 2.5mg  BID.  Please advise, Thanks.      Patient verbalized understanding

## 2021-02-15 ENCOUNTER — Encounter (HOSPITAL_COMMUNITY)
Admission: RE | Disposition: A | Discharge status: Home / Self Care | Payer: Self-pay | Source: Home / Self Care | Attending: Vascular Surgery

## 2021-02-15 ENCOUNTER — Ambulatory Visit (HOSPITAL_COMMUNITY)
Admission: RE | Admit: 2021-02-15 | Discharge: 2021-02-15 | Disposition: A | Discharge status: Home / Self Care | Payer: Medicare Other | Attending: Vascular Surgery | Admitting: Vascular Surgery

## 2021-02-15 ENCOUNTER — Other Ambulatory Visit: Payer: Self-pay

## 2021-02-15 ENCOUNTER — Encounter (HOSPITAL_BASED_OUTPATIENT_CLINIC_OR_DEPARTMENT_OTHER): Payer: Medicare Other | Admitting: Internal Medicine

## 2021-02-15 DIAGNOSIS — Z950 Presence of cardiac pacemaker: Secondary | ICD-10-CM | POA: Diagnosis not present

## 2021-02-15 DIAGNOSIS — M7989 Other specified soft tissue disorders: Secondary | ICD-10-CM | POA: Diagnosis not present

## 2021-02-15 DIAGNOSIS — I871 Compression of vein: Secondary | ICD-10-CM | POA: Diagnosis not present

## 2021-02-15 DIAGNOSIS — Z7901 Long term (current) use of anticoagulants: Secondary | ICD-10-CM | POA: Diagnosis not present

## 2021-02-15 DIAGNOSIS — Z79899 Other long term (current) drug therapy: Secondary | ICD-10-CM | POA: Insufficient documentation

## 2021-02-15 DIAGNOSIS — Z886 Allergy status to analgesic agent status: Secondary | ICD-10-CM | POA: Diagnosis not present

## 2021-02-15 DIAGNOSIS — Z888 Allergy status to other drugs, medicaments and biological substances status: Secondary | ICD-10-CM | POA: Insufficient documentation

## 2021-02-15 DIAGNOSIS — Z91011 Allergy to milk products: Secondary | ICD-10-CM | POA: Diagnosis not present

## 2021-02-15 DIAGNOSIS — Z7984 Long term (current) use of oral hypoglycemic drugs: Secondary | ICD-10-CM | POA: Diagnosis not present

## 2021-02-15 DIAGNOSIS — R2232 Localized swelling, mass and lump, left upper limb: Secondary | ICD-10-CM | POA: Diagnosis not present

## 2021-02-15 HISTORY — PX: UPPER EXTREMITY VENOGRAPHY: CATH118272

## 2021-02-15 LAB — POCT I-STAT, CHEM 8
BUN: 31 mg/dL — ABNORMAL HIGH (ref 8–23)
Calcium, Ion: 1.35 mmol/L (ref 1.15–1.40)
Chloride: 108 mmol/L (ref 98–111)
Creatinine, Ser: 1.3 mg/dL — ABNORMAL HIGH (ref 0.61–1.24)
Glucose, Bld: 119 mg/dL — ABNORMAL HIGH (ref 70–99)
HCT: 37 % — ABNORMAL LOW (ref 39.0–52.0)
Hemoglobin: 12.6 g/dL — ABNORMAL LOW (ref 13.0–17.0)
Potassium: 4.3 mmol/L (ref 3.5–5.1)
Sodium: 141 mmol/L (ref 135–145)
TCO2: 25 mmol/L (ref 22–32)

## 2021-02-15 SURGERY — UPPER EXTREMITY VENOGRAPHY
Anesthesia: LOCAL | Laterality: Left

## 2021-02-15 MED ORDER — IODIXANOL 320 MG/ML IV SOLN
INTRAVENOUS | Status: DC | PRN
  Administered 2021-02-15: 55 mL

## 2021-02-15 MED ORDER — SODIUM CHLORIDE 0.9 % IV SOLN
INTRAVENOUS | Status: DC
Start: 2021-02-15 — End: 2021-02-15

## 2021-02-15 SURGICAL SUPPLY — 2 items
STOPCOCK MORSE 400PSI 3WAY (MISCELLANEOUS) ×1 IMPLANT
TUBING CIL FLEX 10 FLL-RA (TUBING) ×1 IMPLANT

## 2021-02-15 NOTE — Op Note (Signed)
DATE OF SERVICE: 02/15/2021  PATIENT:  Richard [name]  80 y.o. male  PRE-OPERATIVE DIAGNOSIS:  left arm swelling, pacemaker in place  POST-OPERATIVE DIAGNOSIS:  Same  PROCEDURE:   Left upper extremity and central venogram  SURGEON:  Surgeon(s) and Role:    * [name], MD - Primary  ASSISTANT: none  ANESTHESIA:   none  EBL: min  BLOOD ADMINISTERED:none  DRAINS: none   LOCAL MEDICATIONS USED:  NONE  SPECIMEN:  none  COUNTS: confirmed correct .  TOURNIQUET:  none  PATIENT DISPOSITION:  PACU - hemodynamically stable.   Delay start of Pharmacological VTE agent (>24hrs) due to surgical blood loss or risk of bleeding: no  INDICATION FOR PROCEDURE: [name] is a 80 y.o. male with left arm swelling.  He has a left-sided pacemaker in place.  Clinical exam is concerning for central venous stenosis or occlusion.  Electrophysiology team desired venogram before moving the pacemaker. After careful discussion of risks, benefits, and alternatives the patient was offered venogram. The patient understood and wished to proceed.  OPERATIVE FINDINGS: Severe (greater than 85% subclavian vein stenosis at the area of pacemaker wire entry.  No other evidence of venous stenosis.  The vein appears patent and peripheral and central to the stenosis.  DESCRIPTION OF PROCEDURE: After identification of the patient in the pre-operative holding area, the patient was transferred to the operating room. The patient was positioned supine on the operating room table.  A peripheral IV was placed in the left arm in the preoperative area and used to deliver contrast.  A surgical pause was performed confirming correct patient, procedure, and operative location.  Contrast was infused into the peripheral IV.  Subtraction angiography was performed of the chest, shoulder, and left upper arm.  Significant subclavian vein stenosis was noted about the area of pacemaker wire entry.  Upon completion of the case  instrument and sharps counts were confirmed correct. The patient was transferred to the PACU in good condition. I was present for all portions of the procedure.  [name]. Stanford Breed, MD [facility] Phone Number: [phone] 02/15/2021 1:09 PM

## 2021-02-15 NOTE — Interval H&P Note (Signed)
History and Physical Interval Note:  02/15/2021 1:09 PM  Richard [name]  has presented today for surgery, with the diagnosis of LEFT arm swelling.  The various methods of treatment have been discussed with the patient and family. After consideration of risks, benefits and other options for treatment, the patient has consented to  Procedure(s): CENTRAL VENO (Left) as a surgical intervention.  The patient's history has been reviewed, patient examined, no change in status, stable for surgery.  I have reviewed the patient's chart and labs.  Questions were answered to the patient's satisfaction.     [name]

## 2021-02-16 ENCOUNTER — Encounter (HOSPITAL_BASED_OUTPATIENT_CLINIC_OR_DEPARTMENT_OTHER): Payer: Medicare Other | Admitting: Internal Medicine

## 2021-02-16 ENCOUNTER — Encounter (HOSPITAL_COMMUNITY): Payer: Self-pay | Admitting: Vascular Surgery

## 2021-02-16 DIAGNOSIS — S81802D Unspecified open wound, left lower leg, subsequent encounter: Secondary | ICD-10-CM | POA: Diagnosis not present

## 2021-02-16 DIAGNOSIS — E11622 Type 2 diabetes mellitus with other skin ulcer: Secondary | ICD-10-CM | POA: Diagnosis not present

## 2021-02-16 DIAGNOSIS — S81801D Unspecified open wound, right lower leg, subsequent encounter: Secondary | ICD-10-CM

## 2021-02-16 NOTE — Progress Notes (Signed)
Richard [name], Richard [name] ([id number]) Visit Report for 02/16/2021 Chief Complaint Document Details Patient Name: Date of Service: [name] 02/16/2021 11:00 A M Medical Record Number: [id number] Patient Account Number: [id number] Date of Birth/Sex: Treating RN: 11-Mar-1941 (80 y.o. Richard [name] Primary Care Provider: Roma Schanz Other Clinician: Referring Provider: Treating Provider/Extender: Tharon Aquas in Treatment: 5 Information Obtained from: Patient Chief Complaint Wounds to his left upper extremity and bilateral lower extremities. Electronic Signature(s) Signed: 02/16/2021 12:10:08 PM By: Kalman Shan DO Entered By: Kalman Shan on 02/16/2021 11:58:15 -------------------------------------------------------------------------------- Debridement Details Patient Name: Date of Service: Richard [name], Richard [name] [name] 02/16/2021 11:00 A M Medical Record Number: [id number] Patient Account Number: [id number] Date of Birth/Sex: Treating RN: [birth year] (80 y.o. Richard [name] Primary Care Provider: Roma Schanz Other Clinician: Referring Provider: Treating Provider/Extender: Tharon Aquas in Treatment: 5 Debridement Performed for Assessment: Wound #2 Right,Posterior Lower Leg Performed By: Clinician Deon Pilling, RN Debridement Type: Chemical/Enzymatic/Mechanical Agent Used: Santyl Level of Consciousness (Pre-procedure): Awake and Alert Pre-procedure Verification/Time Out No Taken: Bleeding: None Response to Treatment: Procedure was tolerated well Level of Consciousness (Post- Awake and Alert procedure): Post Debridement Measurements of Total Wound Length: (cm) 2.9 Width: (cm) 2 Depth: (cm) 0.1 Volume: (cm) 0.456 Character of Wound/Ulcer Post Debridement: Stable Post Procedure Diagnosis Same as Pre-procedure Electronic Signature(s) Signed: 02/16/2021 12:10:08 PM By: Kalman Shan DO Signed:  02/16/2021 12:21:32 PM By: Deon Pilling Entered By: Deon Pilling on 02/16/2021 11:36:18 -------------------------------------------------------------------------------- HPI Details Patient Name: Date of Service: Richard [name], Richard [name] [name] 02/16/2021 11:00 A M Medical Record Number: [id number] Patient Account Number: [id number] Date of Birth/Sex: Treating RN: 1940/07/26 (80 y.o. Richard [name] Primary Care Provider: Roma Schanz Other Clinician: Referring Provider: Treating Provider/Extender: Tharon Aquas in Treatment: 5 History of Present Illness HPI Description: Admission 7/15 [name] is an 80 year old male with a past medical history of type 2 diabetes on oral agents, diffuse large B-cell lymphoma, and coronary artery disease status post CABG that presents to the clinic for wounds to his left upper extremity and bilateral lower extremities. He states that he went to urgent care to be evaluated for a fever. And he was instructed to go to the ED to be further evaluated for possible sepsis. Unfortunately he passed out in his car After his appointment and was not discovered for another 3 hours. He was eventually discovered and EMS had to take him out of his vehicle. This is when he developed abrasions to his left arm and legs. He has been using Xeroform to his wound beds every other day. He reports improvement to all the wounds except for the one on the back of his right leg. He denies pain or signs of infection. 7/22; patient presents for 1 week follow-up. He has no issues or complaints today. He has tolerated the compression wrap well on his right lower extremity. He has been able to do the dressing changes without issues to his left leg and left arm. His left arm wound has healed. He denies signs of infection. 7/29; patient presents for 1 week follow-up. He reports that his left leg wound is healed. He tolerated the compression wrap well on his right  leg. He has no issues or complaints today. He denies signs of infection. 8/4; patient presents for 1 week follow-up. He continues to see improvement in wound healing to his right lower extremity. He has no issues or complaints today. He denies  signs of infection. He does state that he went to the dermatologist and had some lesions removed on his face and left leg. He has been keeping these covered with a Band-Aid. 8/19; patient presents for follow-up. He is tolerated the compression wrap well. He has no issues or complaints today. He now only has 1 remaining wound. Electronic Signature(s) Signed: 02/16/2021 12:10:08 PM By: Kalman Shan DO Entered By: Kalman Shan on 02/16/2021 11:58:50 -------------------------------------------------------------------------------- Physical Exam Details Patient Name: Date of Service: Richard [name], Richard [name] [name] 02/16/2021 11:00 A M Medical Record Number: [id number] Patient Account Number: [id number] Date of Birth/Sex: Treating RN: February 23, 1941 (80 y.o. Richard [name] Primary Care Provider: Roma Schanz Other Clinician: Referring Provider: Treating Provider/Extender: Tharon Aquas in Treatment: 5 Constitutional respirations regular, non-labored and within target range for patient.[name] Psychiatric pleasant and cooperative. Notes Right lower extremity: T the posterior aspect there is an open wound with granulation tissue and scant nonviable tissue present. No signs of infection. o Electronic Signature(s) Signed: 02/16/2021 12:10:08 PM By: Kalman Shan DO Entered By: Kalman Shan on 02/16/2021 12:07:23 -------------------------------------------------------------------------------- Physician Orders Details Patient Name: Date of Service: Richard [name], Richard [name] [name] 02/16/2021 11:00 A M Medical Record Number: [id number] Patient Account Number: [id number] Date of Birth/Sex: Treating RN: 21-Dec-1940 (80 y.o. Richard [name] Primary Care Provider: Roma Schanz Other Clinician: Referring Provider: Treating Provider/Extender: Tharon Aquas in Treatment: 5 Verbal / Phone Orders: No Diagnosis Coding ICD-10 Coding Code Description S81.801D Unspecified open wound, right lower leg, subsequent encounter S81.802D Unspecified open wound, left lower leg, subsequent encounter Follow-up Appointments ppointment in 2 weeks. - Dr. Heber  03/02/2021 Return A Nurse Visit: - next week Bathing/ Shower/ Hygiene May shower with protection but do not get wound dressing(s) wet. Edema Control - Lymphedema / SCD / Other Bilateral Lower Extremities Elevate legs to the level of the heart or above for 30 minutes daily and/or when sitting, a frequency of: - throughout the day Avoid standing for long periods of time. Exercise regularly Moisturize legs daily. - left leg daily Wound Treatment Wound #2 - Lower Leg Wound Laterality: Right, Posterior Peri-Wound Care: Sween Lotion (Moisturizing lotion) 1 x Per Week/15 Days Discharge Instructions: Apply moisturizing lotion as directed Prim Dressing: Hydrofera Blue Ready Foam, 2.5 x2.5 in 1 x Per Week/15 Days ary Discharge Instructions: Apply to wound bed as instructed Prim Dressing: Santyl Ointment 1 x Per Week/15 Days ary Discharge Instructions: Apply nickel thick amount to wound bed as instructed Secondary Dressing: Woven Gauze Sponge, Non-Sterile 4x4 in 1 x Per Week/15 Days Discharge Instructions: Apply over primary dressing as directed. Compression Wrap: Kerlix Roll 4.5x3.1 (in/yd) 1 x Per Week/15 Days Discharge Instructions: Apply Kerlix and Coban compression as directed. Compression Wrap: Coban Self-Adherent Wrap 4x5 (in/yd) 1 x Per Week/15 Days Discharge Instructions: Apply over Kerlix as directed. Electronic Signature(s) Signed: 02/16/2021 12:10:08 PM By: Kalman Shan DO Entered By: Kalman Shan on 02/16/2021  12:07:54 -------------------------------------------------------------------------------- Problem List Details Patient Name: Date of Service: Richard [name], Richard [name] [name] 02/16/2021 11:00 A M Medical Record Number: [id number] Patient Account Number: [id number] Date of Birth/Sex: Treating RN: 1940/12/14 (80 y.o. Richard [name] Primary Care Provider: Roma Schanz Other Clinician: Referring Provider: Treating Provider/Extender: Tharon Aquas in Treatment: 5 Active Problems ICD-10 Encounter Code Description Active Date MDM Diagnosis S81.801D Unspecified open wound, right lower leg, subsequent encounter 01/19/2021 No Yes S81.802D Unspecified open wound, left lower leg, subsequent encounter 01/19/2021 No Yes Inactive Problems ICD-10  Code Description Active Date Inactive Date S40.812A Abrasion of left upper arm, initial encounter 01/12/2021 01/12/2021 S81.801A Unspecified open wound, right lower leg, initial encounter 01/12/2021 01/12/2021 H82.993Z Unspecified open wound, left lower leg, initial encounter 01/12/2021 01/12/2021 Resolved Problems ICD-10 Code Description Active Date Resolved Date S40.812D Abrasion of left upper arm, subsequent encounter 01/19/2021 01/19/2021 Electronic Signature(s) Signed: 02/16/2021 12:10:08 PM By: Kalman Shan DO Entered By: Kalman Shan on 02/16/2021 11:56:59 -------------------------------------------------------------------------------- Progress Note Details Patient Name: Date of Service: Richard [name], Richard [name] [name] 02/16/2021 11:00 A M Medical Record Number: [id number] Patient Account Number: [id number] Date of Birth/Sex: Treating RN: [birth year] (80 y.o. Richard [name] Primary Care Provider: Roma Schanz Other Clinician: Referring Provider: Treating Provider/Extender: Tharon Aquas in Treatment: 5 Subjective Chief Complaint Information obtained from Patient Wounds to his left upper  extremity and bilateral lower extremities. History of Present Illness (HPI) Admission 7/15 [name] is an 80 year old male with a past medical history of type 2 diabetes on oral agents, diffuse large B-cell lymphoma, and coronary artery disease status post CABG that presents to the clinic for wounds to his left upper extremity and bilateral lower extremities. He states that he went to urgent care to be evaluated for a fever. And he was instructed to go to the ED to be further evaluated for possible sepsis. Unfortunately he passed out in his car After his appointment and was not discovered for another 3 hours. He was eventually discovered and EMS had to take him out of his vehicle. This is when he developed abrasions to his left arm and legs. He has been using Xeroform to his wound beds every other day. He reports improvement to all the wounds except for the one on the back of his right leg. He denies pain or signs of infection. 7/22; patient presents for 1 week follow-up. He has no issues or complaints today. He has tolerated the compression wrap well on his right lower extremity. He has been able to do the dressing changes without issues to his left leg and left arm. His left arm wound has healed. He denies signs of infection. 7/29; patient presents for 1 week follow-up. He reports that his left leg wound is healed. He tolerated the compression wrap well on his right leg. He has no issues or complaints today. He denies signs of infection. 8/4; patient presents for 1 week follow-up. He continues to see improvement in wound healing to his right lower extremity. He has no issues or complaints today. He denies signs of infection. He does state that he went to the dermatologist and had some lesions removed on his face and left leg. He has been keeping these covered with a Band-Aid. 8/19; patient presents for follow-up. He is tolerated the compression wrap well. He has no issues or complaints  today. He now only has 1 remaining wound. Patient History Information obtained from Patient. Family History Cancer - Mother, Diabetes - Maternal Grandparents, Heart Disease - Paternal Grandparents, Hypertension - Paternal Grandparents, Seizures - Child, Stroke - Father, No family history of Hereditary Spherocytosis, Kidney Disease, Lung Disease, Thyroid Problems, Tuberculosis. Social History Never smoker, Marital Status - Divorced, Alcohol Use - Never, Drug Use - No History, Caffeine Use - Rarely. Medical History Eyes Patient has history of Cataracts - Surgery Hematologic/Lymphatic Patient has history of Anemia Respiratory Patient has history of Sleep Apnea Cardiovascular Patient has history of Arrhythmia, Congestive Heart Failure, Coronary Artery Disease, Hypertension Endocrine Patient has history of Type II  Diabetes Musculoskeletal Patient has history of Osteoarthritis Neurologic Patient has history of Neuropathy Oncologic Patient has history of Received Chemotherapy Denies history of Received Radiation Medical A Surgical History Notes nd Musculoskeletal Spinal Stenosis Oncologic Large B Cell Lymphoma Objective Constitutional respirations regular, non-labored and within target range for patient.. Vitals Time Taken: 10:57 AM, Height: 72 in, Weight: 235 lbs, BMI: 31.9, Temperature: 98.3 F, Pulse: 71 bpm, Respiratory Rate: 20 breaths/min, Blood Pressure: 133/69 mmHg, Capillary Blood Glucose: 140 mg/dl. Psychiatric pleasant and cooperative. General Notes: Right lower extremity: T the posterior aspect there is an open wound with granulation tissue and scant nonviable tissue present. No signs of o infection. Integumentary (Hair, Skin) Wound #2 status is Open. Original cause of wound was Trauma. The date acquired was: 12/24/2020. The wound has been in treatment 5 weeks. The wound is located on the Right,Posterior Lower Leg. The wound measures 2.9cm length x 2cm width x 0.1cm  depth; 4.555cm^2 area and 0.456cm^3 volume. There is Fat Layer (Subcutaneous Tissue) exposed. There is no tunneling or undermining noted. There is a medium amount of serosanguineous drainage noted. The wound margin is distinct with the outline attached to the wound base. There is large (67-100%) red granulation within the wound bed. There is a small (1-33%) amount of necrotic tissue within the wound bed including Adherent Slough. Assessment Active Problems ICD-10 Unspecified open wound, right lower leg, subsequent encounter Unspecified open wound, left lower leg, subsequent encounter Patient continues to show improvement in size and appearance to the posterior right leg wound. No signs of infection on exam. I recommended continuing Santyl and Hydrofera Blue under compression therapy. Patient was agreeable. Previous wound sites noted at last clinic visit from biopsy sites done by dermatology are now healed. Procedures Wound #2 Pre-procedure diagnosis of Wound #2 is a Trauma, Other located on the Right,Posterior Lower Leg . There was a Chemical/Enzymatic/Mechanical debridement performed by Deon Pilling, RN.[name] Agent used was [employer]. There was no bleeding. The procedure was tolerated well. Post Debridement Measurements: 2.9cm length x 2cm width x 0.1cm depth; 0.456cm^3 volume. Character of Wound/Ulcer Post Debridement is stable. Post procedure Diagnosis Wound #2: Same as Pre-Procedure Plan Follow-up Appointments: Return Appointment in 2 weeks. - Dr. Heber [place]n 03/02/2021 Nurse Visit: - next week Bathing/ Shower/ Hygiene: May shower with protection but do not get wound dressing(s) wet. Edema Control - Lymphedema / SCD / Other: Elevate legs to the level of the heart or above for 30 minutes daily and/or when sitting, a frequency of: - throughout the day Avoid standing for long periods of time. Exercise regularly Moisturize legs daily. - left leg daily WOUND #2: - Lower Leg Wound Laterality: Right,  Posterior Peri-Wound Care: Sween Lotion (Moisturizing lotion) 1 x Per Week/15 Days Discharge Instructions: Apply moisturizing lotion as directed Prim Dressing: Hydrofera Blue Ready Foam, 2.5 x2.5 in 1 x Per Week/15 Days ary Discharge Instructions: Apply to wound bed as instructed Prim Dressing: Santyl Ointment 1 x Per Week/15 Days ary Discharge Instructions: Apply nickel thick amount to wound bed as instructed Secondary Dressing: Woven Gauze Sponge, Non-Sterile 4x4 in 1 x Per Week/15 Days Discharge Instructions: Apply over primary dressing as directed. Com pression Wrap: Kerlix Roll 4.5x3.1 (in/yd) 1 x Per Week/15 Days Discharge Instructions: Apply Kerlix and Coban compression as directed. Com pression Wrap: Coban Self-Adherent Wrap 4x5 (in/yd) 1 x Per Week/15 Days Discharge Instructions: Apply over Kerlix as directed. 1. Continue Hydrofera Blue and Santyl under compression therapy 2. Follow-up next week for nurse visit and in  2 weeks with me Electronic Signature(s) Signed: 02/16/2021 12:10:08 PM By: Kalman Shan DO Entered By: Kalman Shan on 02/16/2021 12:09:14 -------------------------------------------------------------------------------- HxROS Details Patient Name: Date of Service: Richard [name], Richard [name] [name] 02/16/2021 11:00 A M Medical Record Number: [id number] Patient Account Number: [id number] Date of Birth/Sex: Treating RN: 1941/04/06 (80 y.o. Richard [name] Primary Care Provider: Roma Schanz Other Clinician: Referring Provider: Treating Provider/Extender: Tharon Aquas in Treatment: 5 Information Obtained From Patient Eyes Medical History: Positive for: Cataracts - Surgery Hematologic/Lymphatic Medical History: Positive for: Anemia Respiratory Medical History: Positive for: Sleep Apnea Cardiovascular Medical History: Positive for: Arrhythmia; Congestive Heart Failure; Coronary Artery Disease;  Hypertension Endocrine Medical History: Positive for: Type II Diabetes Time with diabetes: 20 years Treated with: Oral agents Blood sugar tested every day: Yes Tested : Q am Musculoskeletal Medical History: Positive for: Osteoarthritis Past Medical History Notes: Spinal Stenosis Neurologic Medical History: Positive for: Neuropathy Oncologic Medical History: Positive for: Received Chemotherapy Negative for: Received Radiation Past Medical History Notes: Large B Cell Lymphoma HBO Extended History Items Eyes: Cataracts Immunizations Pneumococcal Vaccine: Received Pneumococcal Vaccination: Yes Received Pneumococcal Vaccination On or After 60th Birthday: No Implantable Devices Yes Family and Social History Cancer: Yes - Mother; Diabetes: Yes - Maternal Grandparents; Heart Disease: Yes - Paternal Grandparents; Hereditary Spherocytosis: No; Hypertension: Yes - Paternal Grandparents; Kidney Disease: No; Lung Disease: No; Seizures: Yes - Child; Stroke: Yes - Father; Thyroid Problems: No; Tuberculosis: No; Never smoker; Marital Status - Divorced; Alcohol Use: Never; Drug Use: No History; Caffeine Use: Rarely; Financial Concerns: No; Food, Clothing or Shelter Needs: No; Support System Lacking: No; Transportation Concerns: No Electronic Signature(s) Signed: 02/16/2021 12:10:08 PM By: Kalman Shan DO Signed: 02/16/2021 12:21:50 PM By: Levan Hurst RN, BSN Entered By: Kalman Shan on 02/16/2021 12:06:39 -------------------------------------------------------------------------------- [place] Details Patient Name: Date of Service: Richard [name], Richard [name] [name] 02/16/2021 Medical Record Number: [id number] Patient Account Number: [id number] Date of Birth/Sex: Treating RN: 1940/12/09 (80 y.o. Richard [name] Primary Care Provider: Roma Schanz Other Clinician: Referring Provider: Treating Provider/Extender: Tharon Aquas in Treatment: 5 Diagnosis  Coding ICD-10 Codes Code Description [phone] Unspecified open wound, right lower leg, subsequent encounter S81.802D Unspecified open wound, left lower leg, subsequent encounter Facility Procedures CPT4 Code: [id number] Description: [phone] - DEBRIDE W/O ANES NON SELECT Modifier: Quantity: 1 Physician Procedures : CPT4 Code Description Modifier [id number] - WC PHYS LEVEL 3 - EST PT ICD-10 Diagnosis Description S81.801D Unspecified open wound, right lower leg, subsequent encounter S81.802D Unspecified open wound, left lower leg, subsequent encounter Quantity: 1 Electronic Signature(s) Signed: 02/16/2021 12:10:08 PM By: Kalman Shan DO Entered By: Kalman Shan on 02/16/2021 12:09:23

## 2021-02-19 ENCOUNTER — Inpatient Hospital Stay: Payer: Medicare Other

## 2021-02-19 ENCOUNTER — Other Ambulatory Visit: Payer: Self-pay

## 2021-02-19 ENCOUNTER — Telehealth: Payer: Self-pay

## 2021-02-19 ENCOUNTER — Inpatient Hospital Stay (HOSPITAL_BASED_OUTPATIENT_CLINIC_OR_DEPARTMENT_OTHER): Payer: Medicare Other | Admitting: Family

## 2021-02-19 ENCOUNTER — Inpatient Hospital Stay: Payer: Medicare Other | Attending: Hematology & Oncology

## 2021-02-19 ENCOUNTER — Encounter: Payer: Self-pay | Admitting: Family

## 2021-02-19 VITALS — BP 122/68 | HR 78 | Temp 98.3°F | Resp 18

## 2021-02-19 VITALS — BP 122/68 | HR 78 | Temp 98.3°F | Resp 18 | Wt 243.0 lb

## 2021-02-19 DIAGNOSIS — D510 Vitamin B12 deficiency anemia due to intrinsic factor deficiency: Secondary | ICD-10-CM | POA: Diagnosis not present

## 2021-02-19 DIAGNOSIS — D519 Vitamin B12 deficiency anemia, unspecified: Secondary | ICD-10-CM

## 2021-02-19 DIAGNOSIS — D500 Iron deficiency anemia secondary to blood loss (chronic): Secondary | ICD-10-CM | POA: Diagnosis not present

## 2021-02-19 DIAGNOSIS — Z95828 Presence of other vascular implants and grafts: Secondary | ICD-10-CM

## 2021-02-19 DIAGNOSIS — G620 Drug-induced polyneuropathy: Secondary | ICD-10-CM | POA: Insufficient documentation

## 2021-02-19 DIAGNOSIS — M7989 Other specified soft tissue disorders: Secondary | ICD-10-CM | POA: Diagnosis not present

## 2021-02-19 DIAGNOSIS — C8338 Diffuse large B-cell lymphoma, lymph nodes of multiple sites: Secondary | ICD-10-CM | POA: Insufficient documentation

## 2021-02-19 DIAGNOSIS — D508 Other iron deficiency anemias: Secondary | ICD-10-CM

## 2021-02-19 LAB — CBC WITH DIFFERENTIAL (CANCER CENTER ONLY)
Abs Immature Granulocytes: 0.21 10*3/uL — ABNORMAL HIGH (ref 0.00–0.07)
Basophils Absolute: 0 10*3/uL (ref 0.0–0.1)
Basophils Relative: 0 %
Eosinophils Absolute: 0.1 10*3/uL (ref 0.0–0.5)
Eosinophils Relative: 1 %
HCT: 39.8 % (ref 39.0–52.0)
Hemoglobin: 12.5 g/dL — ABNORMAL LOW (ref 13.0–17.0)
Immature Granulocytes: 2 %
Lymphocytes Relative: 17 %
Lymphs Abs: 1.6 10*3/uL (ref 0.7–4.0)
MCH: 28.7 pg (ref 26.0–34.0)
MCHC: 31.4 g/dL (ref 30.0–36.0)
MCV: 91.5 fL (ref 80.0–100.0)
Monocytes Absolute: 0.5 10*3/uL (ref 0.1–1.0)
Monocytes Relative: 6 %
Neutro Abs: 6.7 10*3/uL (ref 1.7–7.7)
Neutrophils Relative %: 74 %
Platelet Count: 178 10*3/uL (ref 150–400)
RBC: 4.35 MIL/uL (ref 4.22–5.81)
RDW: 16.8 % — ABNORMAL HIGH (ref 11.5–15.5)
WBC Count: 9.2 10*3/uL (ref 4.0–10.5)
nRBC: 0 % (ref 0.0–0.2)

## 2021-02-19 LAB — CMP (CANCER CENTER ONLY)
ALT: 25 U/L (ref 0–44)
AST: 20 U/L (ref 15–41)
Albumin: 4.3 g/dL (ref 3.5–5.0)
Alkaline Phosphatase: 64 U/L (ref 38–126)
Anion gap: 9 (ref 5–15)
BUN: 31 mg/dL — ABNORMAL HIGH (ref 8–23)
CO2: 25 mmol/L (ref 22–32)
Calcium: 9.4 mg/dL (ref 8.9–10.3)
Chloride: 107 mmol/L (ref 98–111)
Creatinine: 1.47 mg/dL — ABNORMAL HIGH (ref 0.61–1.24)
GFR, Estimated: 47 mL/min — ABNORMAL LOW (ref >60)
Glucose, Bld: 126 mg/dL — ABNORMAL HIGH (ref 70–99)
Potassium: 4.2 mmol/L (ref 3.5–5.1)
Sodium: 141 mmol/L (ref 135–145)
Total Bilirubin: 0.5 mg/dL (ref 0.3–1.2)
Total Protein: 6.8 g/dL (ref 6.5–8.1)

## 2021-02-19 LAB — RETICULOCYTES
Immature Retic Fract: 31.9 % — ABNORMAL HIGH (ref 2.3–15.9)
RBC.: 4.32 MIL/uL (ref 4.22–5.81)
Retic Count, Absolute: 76 10*3/uL (ref 19.0–186.0)
Retic Ct Pct: 1.8 % (ref 0.4–3.1)

## 2021-02-19 LAB — LACTATE DEHYDROGENASE: LDH: 291 U/L — ABNORMAL HIGH (ref 98–192)

## 2021-02-19 LAB — VITAMIN B12: Vitamin B-12: 161 pg/mL — ABNORMAL LOW (ref 180–914)

## 2021-02-19 MED ORDER — HEPARIN SOD (PORK) LOCK FLUSH 100 UNIT/ML IV SOLN
500.0000 [IU] | Freq: Once | INTRAVENOUS | Status: AC
  Administered 2021-02-19: 500 [IU] via INTRAVENOUS

## 2021-02-19 MED ORDER — SODIUM CHLORIDE 0.9% FLUSH
10.0000 mL | Freq: Once | INTRAVENOUS | Status: AC
  Administered 2021-02-19: 10 mL via INTRAVENOUS

## 2021-02-19 MED ORDER — CYANOCOBALAMIN 1000 MCG/ML IJ SOLN
1000.0000 ug | Freq: Once | INTRAMUSCULAR | Status: AC
  Administered 2021-02-19: 1000 ug via INTRAMUSCULAR
  Filled 2021-02-19: qty 1

## 2021-02-19 NOTE — Progress Notes (Signed)
Hematology and Oncology Follow Up Visit  Richard [name] [id number] [birth year] 80 y.o. 02/19/2021   Principle Diagnosis:  Diffuse large cell non-Hodgkin's lymphoma (IPI = 3) - NOT "double hit" Pernicious anemia Iron deficiency secondary to bleeding   Past Therapy: R-CHOP - s/p cycle 8 - completed 08/2017   Current Therapy:        Vitamin B12 1 mg IM every month Xgeva 120 mg subcu q 3 months - next dose due in 03/2021 IV iron as indicated    Interim History:  Richard [name] is here today for follow-up. He states that he is going back to see EP for evaluation of his pacemaker. He states that they confirmed that one of the wires has caused a blockage and this is what has led to the swelling in his left arm. Radial pulses are 3+, pedal pulses are 2+.  Lower extremity swelling in stab;e/unchanged from baseline.  Neuropathy unchanged.  No falls or syncope to report.  No fever, chills, n/v, cough, rash, dizziness, SOB, chest pain, palpitations, abdominal pain or changes in bowel or bladder habits.  He has maintained a good appetite and is staying well hydrated. His weight is stable at 243 lbs.   ECOG Performance Status: 1 - Symptomatic but completely ambulatory  Medications:  Allergies as of 02/19/2021       Reactions   Benazepril Swelling   angioedema; he is not a candidate for any angiotensin receptor blockers because of this significant allergic reaction. Because of a history of documented adverse serious drug reaction;Medi Alert bracelet  is recommended   Hctz [hydrochlorothiazide] Anaphylaxis, Swelling   Tongue and lip swelling   Aspirin Other (See Comments)   Gastritis, cant take 325 Mg aspirin    Lactose Intolerance (gi) Nausea And Vomiting        Medication List        Accurate as of February 19, 2021  2:01 PM. If you have any questions, ask your nurse or doctor.          acetaminophen 325 MG tablet Commonly known as: TYLENOL Take 650 mg by mouth at bedtime as  needed for moderate pain or headache.   allopurinol 300 MG tablet Commonly known as: ZYLOPRIM Take 450 mg by mouth daily.   aluminum hydroxide-magnesium carbonate 95-358 MG/15ML Susp Commonly known as: GAVISCON Take 15 mLs by mouth as needed for indigestion or heartburn.   amLODipine 5 MG tablet Commonly known as: NORVASC TAKE 1 TABLET BY MOUTH TWICE A DAY   apixaban 5 MG Tabs tablet Commonly known as: ELIQUIS Take 1 tablet (5 mg total) by mouth 2 (two) times daily.   atorvastatin 80 MG tablet Commonly known as: LIPITOR TAKE ONE TABLET BY MOUTH AT BEDTIME   azelastine 0.1 % nasal spray Commonly known as: ASTELIN Place 2 sprays into both nostrils at bedtime as needed for rhinitis or allergies. What changed: when to take this   dapagliflozin propanediol 10 MG Tabs tablet Commonly known as: FARXIGA Take 10 mg by mouth daily.   esomeprazole 40 MG capsule Commonly known as: NexIUM Take 1 capsule (40 mg total) by mouth daily.   ezetimibe 10 MG tablet Commonly known as: ZETIA TAKE 1 TABLET BY MOUTH EVERY DAY   fenofibrate 160 MG tablet TAKE 1 TABLET BY MOUTH EVERY DAY   folic acid 1 MG tablet Commonly known as: FOLVITE TAKE 2 TABLETS BY MOUTH EVERY DAY   freestyle lancets USE TWICE A DAY TO CHECK BLOOD SUGAR. DX E11.9  FREESTYLE LITE test strip Generic drug: glucose blood USE TO TEST BLOOD SUGAR ONCE A DAY. DX CODE: E11.9   glimepiride 1 MG tablet Commonly known as: AMARYL TAKE 1 TABLET BY MOUTH DAILY WITH BREAKFAST.   metFORMIN 1000 MG tablet Commonly known as: GLUCOPHAGE TAKE 1 & 1/2 TABLETS BY MOUTH EVERY MORNING AND 1 TABLET IN THE EVENING.   metoprolol tartrate 25 MG tablet Commonly known as: LOPRESSOR TAKE ONE-HALF TABLET BY MOUTH TWICE DAILY   nitroGLYCERIN 0.4 MG SL tablet Commonly known as: NITROSTAT PLACE 1 TABLET (0.4 MG TOTAL) UNDER THE TONGUE EVERY 5 (FIVE) MINUTES AS NEEDED FOR CHEST PAIN.   potassium chloride SA 20 MEQ tablet Commonly  known as: Klor-Con M20 Take 2 tablets (40 mEq total) by mouth 2 (two) times daily.   pregabalin 150 MG capsule Commonly known as: LYRICA TAKE 1 CAPSULE BY MOUTH TWICE A DAY   psyllium 58.6 % powder Commonly known as: METAMUCIL Take 1 packet by mouth daily as needed (constipation).   torsemide 20 MG tablet Commonly known as: DEMADEX Take 20 mg by mouth daily.   Vascepa 1 g capsule Generic drug: icosapent Ethyl TAKE 2 CAPSULES BY MOUTH TWICE A DAY        Allergies:  Allergies  Allergen Reactions   Benazepril Swelling    angioedema; he is not a candidate for any angiotensin receptor blockers because of this significant allergic reaction. Because of a history of documented adverse serious drug reaction;Medi Alert bracelet  is recommended   Hctz [Hydrochlorothiazide] Anaphylaxis and Swelling    Tongue and lip swelling    Aspirin Other (See Comments)    Gastritis, cant take 325 Mg aspirin    Lactose Intolerance (Gi) Nausea And Vomiting    Past Medical History, Surgical history, Social history, and Family History were reviewed and updated.  Review of Systems: All other 10 point review of systems is negative.   Physical Exam:  weight is 243 lb (110.2 kg). His oral temperature is 98.3 F (36.8 C). His blood pressure is 122/68 and his pulse is 78. His respiration is 18 and oxygen saturation is 93%.   Wt Readings from Last 3 Encounters:  02/19/21 243 lb (110.2 kg)  02/15/21 236 lb (107 kg)  02/13/21 241 lb (109.3 kg)    Ocular: Sclerae unicteric, pupils equal, round and reactive to light Ear-nose-throat: Oropharynx clear, dentition fair Lymphatic: No cervical or supraclavicular adenopathy Lungs no rales or rhonchi, good excursion bilaterally Heart regular rate and rhythm, no murmur appreciated Abd soft, nontender, positive bowel sounds MSK no focal spinal tenderness, no joint edema Neuro: non-focal, well-oriented, appropriate affect Breasts: Deferred   Lab Results   Component Value Date   WBC 9.2 02/19/2021   HGB 12.5 (L) 02/19/2021   HCT 39.8 02/19/2021   MCV 91.5 02/19/2021   PLT 178 02/19/2021   Lab Results  Component Value Date   FERRITIN 988 (H) 01/19/2021   IRON 59 01/19/2021   TIBC 340 01/19/2021   UIBC 281 01/19/2021   IRONPCTSAT 17 (L) 01/19/2021   Lab Results  Component Value Date   RETICCTPCT 1.8 02/19/2021   RBC 4.32 02/19/2021   RBC 4.35 02/19/2021   RETICCTABS 52.1 11/22/2011   No results found for: Nils Pyle [facility] Lab Results  Component Value Date   IGA 159 03/09/2012   Lab Results  Component Value Date   ALBUMINELP 4.2 07/27/2018   MSPIKE Not Observed 07/27/2018     Chemistry      Component  Value Date/Time   NA 141 02/19/2021 1320   NA 136 03/16/2019 0938   NA 144 06/27/2017 0857   K 4.2 02/19/2021 1320   K 3.9 06/27/2017 0857   CL 107 02/19/2021 1320   CL 103 06/27/2017 0857   CO2 25 02/19/2021 1320   CO2 26 06/27/2017 0857   BUN 31 (H) 02/19/2021 1320   BUN 34 (H) 03/16/2019 0938   BUN 12 06/27/2017 0857   CREATININE 1.47 (H) 02/19/2021 1320   CREATININE 1.65 (H) 04/18/2020 0956      Component Value Date/Time   CALCIUM 9.4 02/19/2021 1320   CALCIUM 9.2 06/27/2017 0857   ALKPHOS 64 02/19/2021 1320   ALKPHOS 128 (H) 06/27/2017 0857   AST 20 02/19/2021 1320   ALT 25 02/19/2021 1320   ALT 24 06/27/2017 0857   BILITOT 0.5 02/19/2021 1320       Impression and Plan: Richard [name] is a very pleasant 80 yo caucasian gentleman with diffuse large B-cell lymphoma (not "double hit" lymphoma). He completed treatment in February 2019.  Her received his B 12.  Iron studies are pending. Follow-up in 1 month.  He can contact our office with any questions or concerns.   Laverna Peace, NP 8/22/20222:01 PM

## 2021-02-19 NOTE — Patient Instructions (Signed)

## 2021-02-19 NOTE — Progress Notes (Signed)
Chronic Care Management Pharmacy Assistant   Name: Richard [name]  MRN: [id number] DOB: [birth year]   Reason for Encounter: Disease State Diabetes    Recent office visits:  01/04/21 Lyndal Pulley [facility] Follow up - Follow up in 3 months   Recent consult visits:  12/19/20 Gardiner Barefoot DPM - [facility] risk foot care - Follow up in 12 weeks  12/20/20 Laverna Peace NP- [facility] - oncology follow up  12/21/20 Ruta Hinds MD - Left arm swelling - Vascular vein specialist - follow up as needed  01/12/21 [place] [facility]  01/19/21 [place] [facility]  01/19/21 - Laverna Peace NP - [facility] - Follow up and B 12 injection - Follow up in 1 month  01/26/21 [place] [facility]  01/31/21 Brendolyn Patty MD - [facility] - Follow up visit - Follow up in 3 months  02/01/21 [place] [facility]  02/06/21 Satira Mccallum Tillery PA-C - [facility] - Pacemaker follow-up - follow up in November  02/13/21 Jamelle Haring MD - Persistent upper Left extremity swelling  02/16/21 [place] [facility]  02/19/21 Laverna Peace NP - Hematology and Oncology Follow Up Visit - follow up in one month.     Hospital visits:  Medication Reconciliation was completed by comparing discharge summary, patient's EMR and Pharmacy list, and upon discussion with patient.  Admitted to the hospital on 12/24/20 due to Sepsis. Discharge date was 12/24/20. Discharged from [facility]?Medications Started at [facility] Discharge:?? Doxycycline monohydrate (MONODOX) 100 mg capsule Take one capsule (100 mg dose) by mouth 2 (two) times daily for 5 days.  Medication Changes at Hospital Discharge: -Changed levofloxacin (LEVAQUIN) 500 mg tablet Take one tablet (500 mg dose) by mouth daily for 5 days.  Medications Discontinued at Hospital Discharge: -Stopped  doxycycline hyclate (VIBRAMYCIN) 100 mg capsule, predniSONE (DELTASONE) 10 mg tablet, torsemide (DEMADEX) 20 mg tablet   Medications that remain the same after Hospital Discharge:??  -All other medications will remain the same.    Medications: Outpatient Encounter Medications as of 02/19/2021  Medication Sig   acetaminophen (TYLENOL) 325 MG tablet Take 650 mg by mouth at bedtime as needed for moderate pain or headache.   allopurinol (ZYLOPRIM) 300 MG tablet Take 450 mg by mouth daily.   aluminum hydroxide-magnesium carbonate (GAVISCON) 95-358 MG/15ML SUSP Take 15 mLs by mouth as needed for indigestion or heartburn.   amLODipine (NORVASC) 5 MG tablet TAKE 1 TABLET BY MOUTH TWICE A DAY   apixaban (ELIQUIS) 5 MG TABS tablet Take 1 tablet (5 mg total) by mouth 2 (two) times daily.   atorvastatin (LIPITOR) 80 MG tablet TAKE ONE TABLET BY MOUTH AT BEDTIME   azelastine (ASTELIN) 0.1 % nasal spray Place 2 sprays into both nostrils at bedtime as needed for rhinitis or allergies. (Patient taking differently: Place 2 sprays into both nostrils daily as needed for rhinitis or allergies.)   dapagliflozin propanediol (FARXIGA) 10 MG TABS tablet Take 10 mg by mouth daily.   esomeprazole (NEXIUM) 40 MG capsule Take 1 capsule (40 mg total) by mouth daily.   ezetimibe (ZETIA) 10 MG tablet TAKE 1 TABLET BY MOUTH EVERY DAY   fenofibrate 160 MG tablet TAKE 1 TABLET BY MOUTH EVERY DAY   folic acid (FOLVITE) 1 MG tablet TAKE 2 TABLETS BY MOUTH EVERY  DAY   FREESTYLE LITE test strip USE TO TEST BLOOD SUGAR ONCE A DAY. DX CODE: E11.9   glimepiride (AMARYL) 1 MG tablet TAKE 1 TABLET BY MOUTH DAILY WITH BREAKFAST.   Lancets (FREESTYLE) lancets USE TWICE A DAY TO CHECK BLOOD SUGAR. DX E11.9   metFORMIN (GLUCOPHAGE) 1000 MG tablet TAKE 1 & 1/2 TABLETS BY MOUTH EVERY MORNING AND 1 TABLET IN THE EVENING.   metoprolol tartrate (LOPRESSOR) 25 MG tablet TAKE ONE-HALF TABLET BY MOUTH TWICE DAILY   nitroGLYCERIN (NITROSTAT) 0.4  MG SL tablet PLACE 1 TABLET (0.4 MG TOTAL) UNDER THE TONGUE EVERY 5 (FIVE) MINUTES AS NEEDED FOR CHEST PAIN.   potassium chloride SA (KLOR-CON M20) 20 MEQ tablet Take 2 tablets (40 mEq total) by mouth 2 (two) times daily.   pregabalin (LYRICA) 150 MG capsule TAKE 1 CAPSULE BY MOUTH TWICE A DAY   psyllium (METAMUCIL) 58.6 % powder Take 1 packet by mouth daily as needed (constipation).   torsemide (DEMADEX) 20 MG tablet Take 20 mg by mouth daily.   VASCEPA 1 g capsule TAKE 2 CAPSULES BY MOUTH TWICE A DAY   Facility-Administered Encounter Medications as of 02/19/2021  Medication   cyanocobalamin ((VITAMIN B-12)) injection 1,000 mcg   sodium chloride flush (NS) 0.9 % injection 10 mL    Recent Relevant Labs: Lab Results  Component Value Date/Time   HGBA1C 6.3 (H) 11/23/2020 08:28 AM   HGBA1C 6.7 (H) 07/27/2020 08:58 AM   MICROALBUR <0.7 07/27/2020 08:58 AM   MICROALBUR <0.2 04/18/2020 09:56 AM    Kidney Function Lab Results  Component Value Date/Time   CREATININE 1.47 (H) 02/19/2021 01:20 PM   CREATININE 1.30 (H) 02/15/2021 10:30 AM   CREATININE 1.39 (H) 01/19/2021 01:31 PM   CREATININE 1.65 (H) 04/18/2020 09:56 AM   CREATININE 1.0 06/27/2017 08:57 AM   GFR 32.35 (L) 07/27/2020 08:58 AM   GFRNONAA 47 (L) 02/19/2021 01:20 PM   GFRAA 42 (L) 03/16/2020 08:14 AM    Current antihyperglycemic regimen:  Glimepiride 1mg  daily  Metformin 1000mg  1.5 tabs each morning, and 1 tablet each evening What recent interventions/DTPs have been made to improve glycemic control:  N/a Have there been any recent hospitalizations or ED visits since last visit with CPP? Yes Patient denies hypoglycemic symptoms, including Pale, Sweaty, Shaky, Hungry, Nervous/irritable, and Vision changes Patient denies hyperglycemic symptoms, including none How often are you checking your blood sugar? before breakfast What are your blood sugars ranging?  Fasting: Unable to document due to patient not being at home   Before meals: Unable to document due to patient not being at home  After meals: Unable to document due to patient not being at home  Bedtime: Unable to document due to patient not being at home   During the week, how often does your blood glucose drop below 70? Never Are you checking your feet daily/regularly? Yes  Adherence Review: Is the patient currently on a STATIN medication? Yes Is the patient currently on ACE/ARB medication? No Does the patient have >5 day gap between last estimated fill dates? No  Misc. Comment: Mr. Shubham states that he is doing well. Patient was not at home during the call to get his readings. Patient states that his blood sugars range from 108-120 lately, Patient regularly checks his sugars in the morning before breakfast. Patient states that he has not had any low or high blood sugars lately, patient states he eats a little peanut butter before going to bed to prevent his blood  sugars from dropping through the night, Overall patient states he is doing well.    Star Rating Drugs: Atorvastatin 80 mg  Glimepiride 1 mg  Ezetimibe 10 mg    Andee Poles, CMA

## 2021-02-19 NOTE — Patient Instructions (Signed)
Vitamin B12 Deficiency Vitamin B12 deficiency occurs when the body does not have enough vitamin B12, which is an important vitamin. The body needs this vitamin: To make red blood cells. To make DNA. This is the genetic material inside cells. To help the nerves work properly so they can carry messages from the brain to the body. Vitamin B12 deficiency can cause various health problems, such as a low red blood cell count (anemia) or nerve damage. What are the causes? This condition may be caused by: Not eating enough foods that contain vitamin B12. Not having enough stomach acid and digestive fluids to properly absorb vitamin B12 from the food that you eat. Certain digestive system diseases that make it hard to absorb vitamin B12. These diseases include Crohn's disease, chronic pancreatitis, and cystic fibrosis. A condition in which the body does not make enough of a protein (intrinsic factor), resulting in too few red blood cells (pernicious anemia). Having a surgery in which part of the stomach or small intestine is removed. Taking certain medicines that make it hard for the body to absorb vitamin B12. These medicines include: Heartburn medicines (antacids and proton pump inhibitors). Certain antibiotic medicines. Some medicines that are used to treat diabetes, tuberculosis, gout, or high cholesterol. What increases the risk? The following factors may make you more likely to develop a B12 deficiency: Being older than age 50. Eating a vegetarian or vegan diet, especially while you are pregnant. Eating a poor diet while you are pregnant. Taking certain medicines. Having alcoholism. What are the signs or symptoms? In some cases, there are no symptoms of this condition. If the condition leads to anemia or nerve damage, various symptoms can occur, such as: Weakness. Fatigue. Loss of appetite. Weight loss. Numbness or tingling in your hands and feet. Redness and burning of the  tongue. Confusion or memory problems. Depression. Sensory problems, such as color blindness, ringing in the ears, or loss of taste. Diarrhea or constipation. Trouble walking. If anemia is severe, symptoms can include: Shortness of breath. Dizziness. Rapid heart rate (tachycardia). How is this diagnosed? This condition may be diagnosed with a blood test to measure the level of vitamin B12 in your blood. You may also have other tests, including: A group of tests that measure certain characteristics of blood cells (complete blood count, CBC). A blood test to measure intrinsic factor. A procedure where a thin tube with a camera on the end is used to look into your stomach or intestines (endoscopy). Other tests may be needed to discover the cause of B12 deficiency. How is this treated? Treatment for this condition depends on the cause. This condition may be treated by: Changing your eating and drinking habits, such as: Eating more foods that contain vitamin B12. Drinking less alcohol or no alcohol. Getting vitamin B12 injections. Taking vitamin B12 supplements. Your health care provider will tell you which dosage is best for you. Follow these instructions at home: Eating and drinking  Eat lots of healthy foods that contain vitamin B12, including: Meats and poultry. This includes beef, pork, chicken, turkey, and organ meats, such as liver. Seafood. This includes clams, rainbow trout, salmon, tuna, and haddock. Eggs. Cereal and dairy products that are fortified. This means that vitamin B12 has been added to the food. Check the label on the package to see if the food is fortified. The items listed above may not be a complete list of recommended foods and beverages. Contact a dietitian for more information. General instructions Get any   injections that are prescribed by your health care provider. Take supplements only as told by your health care provider. Follow the directions carefully. Do  not drink alcohol if your health care provider tells you not to. In some cases, you may only be asked to limit alcohol use. Keep all follow-up visits as told by your health care provider. This is important. Contact a health care provider if: Your symptoms come back. Get help right away if you: Develop shortness of breath. Have a rapid heart rate. Have chest pain. Become dizzy or lose consciousness. Summary Vitamin B12 deficiency occurs when the body does not have enough vitamin B12. The main causes of vitamin B12 deficiency include dietary deficiency, digestive diseases, pernicious anemia, and having a surgery in which part of the stomach or small intestine is removed. In some cases, there are no symptoms of this condition. If the condition leads to anemia or nerve damage, various symptoms can occur, such as weakness, shortness of breath, and numbness. Treatment may include getting vitamin B12 injections or taking vitamin B12 supplements. Eat lots of healthy foods that contain vitamin B12. This information is not intended to replace advice given to you by your health care provider. Make sure you discuss any questions you have with your healthcare provider. Document Revised: 12/04/2018 Document Reviewed: 02/24/2018 Elsevier Patient Education  2022 [employer]  

## 2021-02-20 LAB — IRON AND TIBC
Iron: 67 ug/dL (ref 42–163)
Saturation Ratios: 20 % (ref 20–55)
TIBC: 338 ug/dL (ref 202–409)
UIBC: 271 ug/dL (ref 117–376)

## 2021-02-20 LAB — FERRITIN: Ferritin: 1762 ng/mL — ABNORMAL HIGH (ref 24–336)

## 2021-02-21 NOTE — Progress Notes (Signed)
Richard, [name] ([id number]) Visit Report for 02/16/2021 Arrival Information Details Patient Name: Date of Service: Richard [name] [name] 02/16/2021 11:00 A M Medical Record Number: [id number] Patient Account Number: [id number] Date of Birth/Sex: Treating RN: March 13, 1941 (80 y.o. Richard [name] Primary Care Girolamo Lortie: Roma Schanz Other Clinician: Referring Bud Kaeser: Treating Devereaux Grayson/Extender: Tharon Aquas in Treatment: 5 Visit Information History Since Last Visit Added or deleted any medications: No Patient Arrived: Ambulatory Any new allergies or adverse reactions: No Arrival Time: 10:52 Had a fall or experienced change in No Transfer Assistance: None activities of daily living that may affect Patient Identification Verified: Yes risk of falls: Secondary Verification Process Completed: Yes Signs or symptoms of abuse/neglect since last visito No Patient Requires Transmission-Based Precautions: No Hospitalized since last visit: No Patient Has Alerts: Yes Implantable device outside of the clinic excluding No Patient Alerts: Patient on Blood Thinner cellular tissue based products placed in the center since last visit: Has Dressing in Place as Prescribed: Yes Has Compression in Place as Prescribed: Yes Pain Present Now: No Electronic Signature(s) Signed: 02/21/2021 9:12:52 AM By: Sandre Kitty Entered By: Sandre Kitty on 02/16/2021 10:57:36 -------------------------------------------------------------------------------- Encounter Discharge Information Details Patient Name: Date of Service: Richard [name], Richard [name] [name] 02/16/2021 11:00 A M Medical Record Number: [id number] Patient Account Number: [id number] Date of Birth/Sex: Treating RN: [birth year] (80 y.o. Richard [name] Primary Care Debrina Kizer: Roma Schanz Other Clinician: Referring Talvin Christianson: Treating Kayanna Mckillop/Extender: Tharon Aquas in Treatment:  5 Encounter Discharge Information Items Post Procedure Vitals Discharge Condition: Stable Temperature (F): 98.3 Ambulatory Status: Ambulatory Pulse (bpm): 71 Discharge Destination: Home Respiratory Rate (breaths/min): 20 Transportation: Private Auto Blood Pressure (mmHg): 133/69 Accompanied By: self Schedule Follow-up Appointment: Yes Clinical Summary of Care: Electronic Signature(s) Signed: 02/16/2021 12:21:32 PM By: Deon Pilling Entered By: Deon Pilling on 02/16/2021 11:37:09 -------------------------------------------------------------------------------- Lower Extremity Assessment Details Patient Name: Date of Service: Richard [name] [name] 02/16/2021 11:00 A M Medical Record Number: [id number] Patient Account Number: [id number] Date of Birth/Sex: Treating RN: Jan 14, 1941 (80 y.o. Marcheta Grammes Primary Care Yates Weisgerber: Roma Schanz Other Clinician: Referring Richard [name]: Treating Khianna Blazina/Extender: Tharon Aquas in Treatment: 5 Edema Assessment Assessed: Shirlyn Goltz: No] Patrice Paradise: Yes] Edema: [Left: N] [Right: o] Calf Left: Right: Point of Measurement: 30 cm From Medial Instep 33.8 cm Ankle Left: Right: Point of Measurement: 9 cm From Medial Instep 23.3 cm Vascular Assessment Pulses: Dorsalis Pedis Palpable: [Right:Yes] Electronic Signature(s) Signed: 02/16/2021 12:15:18 PM By: Lorrin Jackson Entered By: Lorrin Jackson on 02/16/2021 11:04:47 -------------------------------------------------------------------------------- Multi Wound Chart Details Patient Name: Date of Service: Richard [name], Richard [name] [name] 02/16/2021 11:00 A M Medical Record Number: [id number] Patient Account Number: [id number] Date of Birth/Sex: Treating RN: [birth year] (80 y.o. Richard [name] Primary Care Richard [name]: Roma Schanz Other Clinician: Referring Annalysa Mohammad: Treating Deamonte [name]/Extender: Tharon Aquas in Treatment: 5 Vital  Signs Height(in): 72 Capillary Blood Glucose(mg/dl): 140 Weight(lbs): 235 Pulse(bpm): 58 Body Mass Index(BMI): 32 Blood Pressure(mmHg): 133/69 Temperature(F): 98.3 Respiratory Rate(breaths/min): 20 Photos: [N/A:N/A] Right, Posterior Lower Leg N/A N/A Wound Location: Trauma N/A N/A Wounding Event: Trauma, Other N/A N/A Primary Etiology: Cataracts, Anemia, Sleep Apnea, N/A N/A Comorbid History: Arrhythmia, Congestive Heart Failure, Coronary Artery Disease, Hypertension, Type II Diabetes, Osteoarthritis, Neuropathy, Received Chemotherapy 12/24/2020 N/A N/A Date Acquired: 5 N/A N/A Weeks of Treatment: Open N/A N/A Wound Status: 2.9x2x0.1 N/A N/A Measurements L x W x D (cm) 4.555 N/A N/A A (cm) : rea 0.456 N/A N/A Volume (cm) :  66.90% N/A N/A % Reduction in A rea: 83.40% N/A N/A % Reduction in Volume: Full Thickness Without Exposed N/A N/A Classification: Support Structures Medium N/A N/A Exudate A mount: Serosanguineous N/A N/A Exudate Type: red, brown N/A N/A Exudate Color: Distinct, outline attached N/A N/A Wound Margin: Large (67-100%) N/A N/A Granulation A mount: Red N/A N/A Granulation Quality: Small (1-33%) N/A N/A Necrotic A mount: Fat Layer (Subcutaneous Tissue): Yes N/A N/A Exposed Structures: Fascia: No Tendon: No Muscle: No Joint: No Bone: No Small (1-33%) N/A N/A Epithelialization: Chemical/Enzymatic/Mechanical N/A N/A Debridement: N/A N/A N/A Instrument: None N/A N/A Bleeding: Debridement Treatment Response: Procedure was tolerated well N/A N/A Post Debridement Measurements L x 2.9x2x0.1 N/A N/A W x D (cm) 0.456 N/A N/A Post Debridement Volume: (cm) Debridement N/A N/A Procedures Performed: Treatment Notes Wound #2 (Lower Leg) Wound Laterality: Right, Posterior Cleanser Peri-Wound Care Sween Lotion (Moisturizing lotion) Discharge Instruction: Apply moisturizing lotion as directed Topical Primary Dressing Hydrofera  Blue Ready Foam, 2.5 x2.5 in Discharge Instruction: Apply to wound bed as instructed Santyl Ointment Discharge Instruction: Apply nickel thick amount to wound bed as instructed Secondary Dressing Woven Gauze Sponge, Non-Sterile 4x4 in Discharge Instruction: Apply over primary dressing as directed. Secured With Compression Wrap Kerlix Roll 4.5x3.1 (in/yd) Discharge Instruction: Apply Kerlix and Coban compression as directed. Coban Self-Adherent Wrap 4x5 (in/yd) Discharge Instruction: Apply over Kerlix as directed. Compression Stockings Add-Ons Electronic Signature(s) Signed: 02/16/2021 12:10:08 PM By: Kalman Shan DO Signed: 02/16/2021 12:21:50 PM By: Levan Hurst RN, BSN Entered By: Kalman Shan on 02/16/2021 11:57:21 -------------------------------------------------------------------------------- Multi-Disciplinary Care Plan Details Patient Name: Date of Service: Richard [name], Richard [name] [name] 02/16/2021 11:00 A M Medical Record Number: [id number] Patient Account Number: [id number] Date of Birth/Sex: Treating RN: 04-13-1941 (80 y.o. Marcheta Grammes Primary Care Alajia Schmelzer: Roma Schanz Other Clinician: Referring Malillany Kazlauskas: Treating Adreanne Yono/Extender: Tharon Aquas in Treatment: Blanco reviewed with physician Active Inactive Wound/Skin Impairment Nursing Diagnoses: Impaired tissue integrity Knowledge deficit related to ulceration/compromised skin integrity Goals: Patient/caregiver will verbalize understanding of skin care regimen Date Initiated: 01/12/2021 Target Resolution Date: 02/09/2021 Goal Status: Active Ulcer/skin breakdown will have a volume reduction of 30% by week 4 Date Initiated: 01/12/2021 Date Inactivated: 02/16/2021 Target Resolution Date: 02/09/2021 Goal Status: Met Ulcer/skin breakdown will have a volume reduction of 80% by week 12 Date Initiated: 02/16/2021 Target Resolution Date: 03/16/2021 Goal  Status: Active Interventions: Assess patient/caregiver ability to obtain necessary supplies Assess patient/caregiver ability to perform ulcer/skin care regimen upon admission and as needed Assess ulceration(s) every visit Provide education on ulcer and skin care Treatment Activities: Skin care regimen initiated : 01/12/2021 Topical wound management initiated : 01/12/2021 Notes: Electronic Signature(s) Signed: 02/16/2021 12:15:18 PM By: Lorrin Jackson Entered By: Lorrin Jackson on 02/16/2021 11:05:41 -------------------------------------------------------------------------------- Pain Assessment Details Patient Name: Date of Service: Richard [name], Richard [name] [name] 02/16/2021 11:00 A M Medical Record Number: [id number] Patient Account Number: [id number] Date of Birth/Sex: Treating RN: 1940-11-09 (80 y.o. Marcheta Grammes Primary Care Lorene Klimas: Roma Schanz Other Clinician: Referring Dajaun Goldring: Treating Amarrion Pastorino/Extender: Tharon Aquas in Treatment: 5 Active Problems Location of Pain Severity and Description of Pain Patient Has Paino No Site Locations Pain Management and Medication Current Pain Management: Electronic Signature(s) Signed: 02/16/2021 12:15:18 PM By: Lorrin Jackson Entered By: Lorrin Jackson on 02/16/2021 10:58:45 -------------------------------------------------------------------------------- Patient/Caregiver Education Details Patient Name: Date of Service: FO RBIS, [name] 8/19/2022andnbsp11:00 [place] Record Number: [id number] Patient Account Number: [id number] Date of Birth/Gender: Treating RN:  03/18/1941 (80 y.o. Marcheta Grammes Primary Care Physician: Roma Schanz Other Clinician: Referring Physician: Treating Physician/Extender: Tharon Aquas in Treatment: 5 Education Assessment Education Provided To: Patient Education Topics Provided Venous: Methods: Explain/Verbal,  Printed Responses: State content correctly Wound/Skin Impairment: Methods: Explain/Verbal, Printed Responses: State content correctly Electronic Signature(s) Signed: 02/16/2021 12:15:18 PM By: Lorrin Jackson Entered By: Lorrin Jackson on 02/16/2021 11:02:17 -------------------------------------------------------------------------------- Wound Assessment Details Patient Name: Date of Service: Richard [name], Richard [name] [name] 02/16/2021 11:00 A M Medical Record Number: [id number] Patient Account Number: [id number] Date of Birth/Sex: Treating RN: 10/01/1940 (80 y.o. Marcheta Grammes Primary Care Sabastion Hrdlicka: Roma Schanz Other Clinician: Referring Nandan Willems: Treating Takeysha Bonk/Extender: Tharon Aquas in Treatment: 5 Wound Status Wound Number: 2 Primary Trauma, Other Etiology: Wound Location: Right, Posterior Lower Leg Wound Open Wounding Event: Trauma Status: Date Acquired: 12/24/2020 Comorbid Cataracts, Anemia, Sleep Apnea, Arrhythmia, Congestive Heart Weeks Of Treatment: 5 History: Failure, Coronary Artery Disease, Hypertension, Type II Diabetes, Clustered Wound: No Osteoarthritis, Neuropathy, Received Chemotherapy Photos Wound Measurements Length: (cm) 2.9 Width: (cm) 2 Depth: (cm) 0.1 Area: (cm) 4.555 Volume: (cm) 0.456 % Reduction in Area: 66.9% % Reduction in Volume: 83.4% Epithelialization: Small (1-33%) Tunneling: No Undermining: No Wound Description Classification: Full Thickness Without Exposed Support Structures Wound Margin: Distinct, outline attached Exudate Amount: Medium Exudate Type: Serosanguineous Exudate Color: red, brown Foul Odor After Cleansing: No Slough/Fibrino Yes Wound Bed Granulation Amount: Large (67-100%) Exposed Structure Granulation Quality: Red Fascia Exposed: No Necrotic Amount: Small (1-33%) Fat Layer (Subcutaneous Tissue) Exposed: Yes Necrotic Quality: Adherent Slough Tendon Exposed: No Muscle Exposed:  No Joint Exposed: No Bone Exposed: No Treatment Notes Wound #2 (Lower Leg) Wound Laterality: Right, Posterior Cleanser Peri-Wound Care Sween Lotion (Moisturizing lotion) Discharge Instruction: Apply moisturizing lotion as directed Topical Primary Dressing Hydrofera Blue Ready Foam, 2.5 x2.5 in Discharge Instruction: Apply to wound bed as instructed Santyl Ointment Discharge Instruction: Apply nickel thick amount to wound bed as instructed Secondary Dressing Woven Gauze Sponge, Non-Sterile 4x4 in Discharge Instruction: Apply over primary dressing as directed. Secured With Compression Wrap Kerlix Roll 4.5x3.1 (in/yd) Discharge Instruction: Apply Kerlix and Coban compression as directed. Coban Self-Adherent Wrap 4x5 (in/yd) Discharge Instruction: Apply over Kerlix as directed. Compression Stockings Add-Ons Electronic Signature(s) Signed: 02/16/2021 12:15:18 PM By: Lorrin Jackson Signed: 02/21/2021 9:12:52 AM By: Sandre Kitty Entered By: Sandre Kitty on 02/16/2021 11:03:44 -------------------------------------------------------------------------------- [place] Details Patient Name: Date of Service: Richard [name], Richard [name] [name] 02/16/2021 11:00 A M Medical Record Number: [id number] Patient Account Number: [id number] Date of Birth/Sex: Treating RN: [birth year] (80 y.o. Richard [name] Primary Care Kiarah Eckstein: Roma Schanz Other Clinician: Referring Kinley Ferrentino: Treating Geovanie Winnett/Extender: Tharon Aquas in Treatment: 5 Vital Signs Time Taken: 10:57 Temperature (F): 98.3 Height (in): 72 Pulse (bpm): 71 Weight (lbs): 235 Respiratory Rate (breaths/min): 20 Body Mass Index (BMI): 31.9 Blood Pressure (mmHg): 133/69 Capillary Blood Glucose (mg/dl): 140 Reference Range: 80 - 120 mg / dl Electronic Signature(s) Signed: 02/21/2021 9:12:52 AM By: Sandre Kitty Entered By: Sandre Kitty on 02/16/2021 10:58:04

## 2021-02-22 ENCOUNTER — Encounter: Payer: Medicare Other | Admitting: Student

## 2021-02-22 NOTE — Progress Notes (Signed)
Remote pacemaker transmission.   

## 2021-02-23 ENCOUNTER — Other Ambulatory Visit: Payer: Self-pay

## 2021-02-23 ENCOUNTER — Encounter (HOSPITAL_BASED_OUTPATIENT_CLINIC_OR_DEPARTMENT_OTHER): Payer: Medicare Other | Admitting: Internal Medicine

## 2021-02-23 DIAGNOSIS — E11622 Type 2 diabetes mellitus with other skin ulcer: Secondary | ICD-10-CM | POA: Diagnosis not present

## 2021-02-26 NOTE — Progress Notes (Deleted)
Electrophysiology Office Note Date: 02/26/2021  ID:  Richard [name], DOB Sep 15, 1940, MRN [id number]  PCP: [name], DO Primary Cardiologist: Minus Breeding, MD Electrophysiologist: [name], MD   CC: Pacemaker follow-up  Richard [name] is a 80 y.o. male seen today for [name], MD for routine electrophysiology followup.  Since last being seen in our clinic the patient reports doing OK.  Over the past several months he has developed worsening Left arm swelling concerning for significant subclavian stenosis.   [place] 10/20/2020 with no evidence of DVT.   CT 10/24/20 1. No evidence for lymphadenopathy in the chest. 2. Soft tissue stranding in the left axilla with small axillary lymph nodes. Imaging features suggest edema/inflammation. 3. Venous anatomy of the left axillary region is un opacified due to bolus timing and use of the right upper extremity for contrast administration. Patient had left upper extremity venous ultrasound on 10/20/2020. 4. Aortic Atherosclerosis (ICD10-I70.0).  Saw Dr. Valinda Hoar 12/21/20 who recommended EP follow up to discuss. Note says  "discussed with the patient the possibility of a central venogram and intervention however this most likely would be of limited durability due to the indwelling pacing wires and could potentially injure the conduction system of the pacing wires with the angioplasty.  The patient so far has fairly minimal symptoms from this and does not feel debilitated by this.  I believe the best option at this point would be to fit him for a left upper extremity compression garment to control swelling symptoms.  If his symptoms worsen over time and he feels this is more lifestyle limiting we could consider a central venogram.  However in light of his age any procedure would be of higher risk may be only of minimal durability.   If he develops any infections in his left upper extremity this should be treated very aggressively  since he has some lymphatic obstruction probably as well as venous outflow obstruction."  Underwent UE venogram 02/15/21 which showed "Severe (greater than 85% subclavian vein stenosis at the area of pacemaker wire entry.  No other evidence of venous stenosis.  The vein appears patent and peripheral and central to the stenosis."  He follows up today for EP planning. ***    Device History: Medtronic Dual Chamber PPM implanted 08/2017 for CHB  Past Medical History:  Diagnosis Date   Anemia    Arthritis    hips   Axillary adenopathy 02/25/2017   Bradycardia    a. holter monitor has demonstrated HRs in 30s and Weinkibach    CAD (coronary artery disease)    a. s/p CABG 2001  b.  07/28/2017 cath:   Severe three-vessel native CAD with total occlusion of LAD, ramus intermedius, first OM and RCA, patent RIMA to PDA, LIMA to LAD, sequential SVG to ramus intermedius and first OM.     Chronic lower back pain    Diffuse large B cell lymphoma (HCC)    Diverticulosis    Esophageal stricture    GERD (gastroesophageal reflux disease)    History of gout    HTN (hypertension)    Mixed hyperlipidemia    OSA on CPAP    with 2L O2 at night   Pancytopenia ([place])    a. related to chemo therapy for B cell lymphoma   Peptic stricture of esophagus    Presence of permanent cardiac pacemaker    sees Dr. Valaria Good pacemaker   SCC (squamous cell carcinoma) 01/31/2021   R zygoma, ED[facility]  SCC (squamous cell carcinoma) 01/31/2021   L post ankle, ED[facility]   SCC (squamous cell carcinoma) 01/31/2021   L popliteal, ED[facility]   Severe aortic stenosis    Spinal stenosis    Squamous cell carcinoma of skin 03/22/2009   Right mandible. SCCis, hypertrophic.    Type II diabetes mellitus ([place])    Wears dentures    partial upper   Past Surgical History:  Procedure Laterality Date   APPENDECTOMY  ~ [place] Left 11/06/2016   Procedure: CATARACT EXTRACTION PHACO AND INTRAOCULAR  LENS PLACEMENT (IOC);  Surgeon: Estill Cotta, MD;  Location: [facility];  Service: Ophthalmology;  Laterality: Left;  Lot # [id number] H [place]: 01:09.4 AP%:25.2 CDE: 30.64   CATARACT EXTRACTION W/PHACO Right 12/04/2016   Procedure: CATARACT EXTRACTION PHACO AND INTRAOCULAR LENS PLACEMENT (IOC);  Surgeon: Estill Cotta, MD;  Location: [facility];  Service: Ophthalmology;  Laterality: Right;  [place] 1:25.9 AP% 24.1 CDE 39.10 Fluid Pack lot # [id number]   COLONOSCOPY W/ BIOPSIES AND POLYPECTOMY  2013   CORONARY ANGIOPLASTY  1993   CORONARY ANGIOPLASTY WITH STENT PLACEMENT  05/1997   "1"   CORONARY ARTERY BYPASS GRAFT  03/2000   "CABG X5"   ECTROPION REPAIR Right 09/01/2018   Procedure: REPAIR OF ECTROPION BILATERAL upper and lower;  Surgeon: Karle Starch, MD;  Location: [place];  Service: Ophthalmology;  Laterality: Right;  Diabetic - oral meds sleep apnea   ESOPHAGEAL DILATION  X 3-4   Dr. Lyla Son; "last one was in the 1990's"   ESOPHAGOGASTRODUODENOSCOPY     multiple   FLEXIBLE SIGMOIDOSCOPY     multiple   HYDRADENITIS EXCISION Left 02/25/2017   Procedure: EXCISION DEEP LEFT AXILLARY LYMPH NODE;  Surgeon: Fanny Skates, MD;  Location: [place];  Service: General;  Laterality: Left;   INTRAOPERATIVE TRANSTHORACIC ECHOCARDIOGRAM N/A 09/09/2017   Procedure: INTRAOPERATIVE TRANSTHORACIC ECHOCARDIOGRAM;  Surgeon: Burnell Blanks, MD;  Location: [place];  Service: Open Heart Surgery;  Laterality: N/A;   KNEE ARTHROSCOPY Left 2011   meniscus repair   LEFT HEART CATHETERIZATION WITH CORONARY/GRAFT ANGIOGRAM N/A 03/16/2014   Procedure: LEFT HEART CATHETERIZATION WITH Beatrix Fetters;  Surgeon: Burnell Blanks, MD;  Location: [facility];  Service: Cardiovascular;  Laterality: N/A;   LUMBAR LAMINECTOMY/DECOMPRESSION MICRODISCECTOMY Right 06/17/2013   Procedure: LUMBAR LAMINECTOMY MICRODISCECTOMY L4-L5 RIGHT EXCISION OF SYNOVIAL CYST RIGHT   (1 LEVEL) RIGHT PARTIAL  FACETECTOMY;  Surgeon: Tobi Bastos, MD;  Location: [facility];  Service: Orthopedics;  Laterality: Right;   MYELOGRAM  04/06/13   lumbar, Dr Gladstone Lighter   ORBITAL LESION EXCISION Right 09/01/2018   Procedure: ORBITOTOMY WITHOUT BONE FLAP WITH REMOVAL OF LESION RIGHT;  Surgeon: Karle Starch, MD;  Location: [place];  Service: Ophthalmology;  Laterality: Right;   PACEMAKER IMPLANT N/A 09/10/2017   Procedure: PACEMAKER IMPLANT;  Surgeon: Deboraha Sprang, MD;  Location: [place] CV LAB;  Service: Cardiovascular;  Laterality: N/A;   PANENDOSCOPY     PORTACATH PLACEMENT N/A 03/06/2017   Procedure: INSERTION PORT-A-CATH AND ASPIRATE SEROMA LEFT AXILLA;  Surgeon: Fanny Skates, MD;  Location: [place];  Service: General;  Laterality: N/A;   RIGHT/LEFT HEART CATH AND CORONARY/GRAFT ANGIOGRAPHY N/A 07/28/2017   Procedure: RIGHT/LEFT HEART CATH AND CORONARY/GRAFT ANGIOGRAPHY;  Surgeon: Sherren Mocha, MD;  Location: [place] [facility];  Service: Cardiovascular;  Laterality: N/A;   SHOULDER SURGERY Right 08/2010   screws placed; "tendons tore off"  SKIN CANCER EXCISION Right    "neck"   TONSILLECTOMY  ~ 1954   TOTAL KNEE ARTHROPLASTY Left 06/29/2019   Procedure: TOTAL KNEE ARTHROPLASTY;  Surgeon: Paralee Cancel, MD;  Location: [facility];  Service: Orthopedics;  Laterality: Left;  70 mins   TRANSCATHETER AORTIC VALVE REPLACEMENT, TRANSFEMORAL N/A 09/09/2017   Procedure: TRANSCATHETER AORTIC VALVE REPLACEMENT, TRANSFEMORAL;  Surgeon: Burnell Blanks, MD;  Location: [place];  Service: Open Heart Surgery;  Laterality: N/A;   UPPER EXTREMITY VENOGRAPHY Left 02/15/2021   Procedure: CENTRAL VENO;  Surgeon: [name], MD;  Location: [place] [facility];  Service: Cardiovascular;  Laterality: Left;   UPPER GI ENDOSCOPY  2013   Gastritis; Dr Elenore Paddy      Current Outpatient Medications  Medication Sig Dispense Refill   acetaminophen (TYLENOL) 325 MG tablet Take 650 mg by mouth at  bedtime as needed for moderate pain or headache.     allopurinol (ZYLOPRIM) 300 MG tablet Take 450 mg by mouth daily.     aluminum hydroxide-magnesium carbonate (GAVISCON) 95-358 MG/15ML SUSP Take 15 mLs by mouth as needed for indigestion or heartburn.     amLODipine (NORVASC) 5 MG tablet TAKE 1 TABLET BY MOUTH TWICE A DAY 180 tablet 3   apixaban (ELIQUIS) 5 MG TABS tablet Take 1 tablet (5 mg total) by mouth 2 (two) times daily. 180 tablet 1   atorvastatin (LIPITOR) 80 MG tablet TAKE ONE TABLET BY MOUTH AT BEDTIME 90 tablet 3   azelastine (ASTELIN) 0.1 % nasal spray Place 2 sprays into both nostrils at bedtime as needed for rhinitis or allergies. (Patient taking differently: Place 2 sprays into both nostrils daily as needed for rhinitis or allergies.) 30 mL 12   dapagliflozin propanediol (FARXIGA) 10 MG TABS tablet Take 10 mg by mouth daily.     esomeprazole (NEXIUM) 40 MG capsule Take 1 capsule (40 mg total) by mouth daily. 30 capsule 5   ezetimibe (ZETIA) 10 MG tablet TAKE 1 TABLET BY MOUTH EVERY DAY 90 tablet 3   fenofibrate 160 MG tablet TAKE 1 TABLET BY MOUTH EVERY DAY 90 tablet 0   folic acid (FOLVITE) 1 MG tablet TAKE 2 TABLETS BY MOUTH EVERY DAY 180 tablet 4   FREESTYLE LITE test strip USE TO TEST BLOOD SUGAR ONCE A DAY. DX CODE: E11.9 100 strip 12   glimepiride (AMARYL) 1 MG tablet TAKE 1 TABLET BY MOUTH DAILY WITH BREAKFAST. 90 tablet 1   Lancets (FREESTYLE) lancets USE TWICE A DAY TO CHECK BLOOD SUGAR. DX E11.9 100 each 6   metFORMIN (GLUCOPHAGE) 1000 MG tablet TAKE 1 & 1/2 TABLETS BY MOUTH EVERY MORNING AND 1 TABLET IN THE EVENING. 225 tablet 1   metoprolol tartrate (LOPRESSOR) 25 MG tablet TAKE ONE-HALF TABLET BY MOUTH TWICE DAILY 90 tablet 3   nitroGLYCERIN (NITROSTAT) 0.4 MG SL tablet PLACE 1 TABLET (0.4 MG TOTAL) UNDER THE TONGUE EVERY 5 (FIVE) MINUTES AS NEEDED FOR CHEST PAIN. 25 tablet 6   potassium chloride SA (KLOR-CON M20) 20 MEQ tablet Take 2 tablets (40 mEq total) by mouth 2  (two) times daily. 360 tablet 3   pregabalin (LYRICA) 150 MG capsule TAKE 1 CAPSULE BY MOUTH TWICE A DAY 180 capsule 0   psyllium (METAMUCIL) 58.6 % powder Take 1 packet by mouth daily as needed (constipation).     torsemide (DEMADEX) 20 MG tablet Take 20 mg by mouth daily.     VASCEPA 1 g capsule TAKE 2 CAPSULES BY  MOUTH TWICE A DAY 120 capsule 5   No current facility-administered medications for this visit.   Facility-Administered Medications Ordered in Other Visits  Medication Dose Route Frequency Provider Last Rate Last Admin   sodium chloride flush (NS) 0.9 % injection 10 mL  10 mL Intravenous PRN [name], MD   10 mL at 05/30/17 0920    Allergies:   Benazepril, Hctz [hydrochlorothiazide], Aspirin, and Lactose intolerance (gi)   Social History: Social History   Socioeconomic History   Marital status: Divorced    Spouse name: Not on file   Number of children: 2   Years of education: college   Highest education level: Not on file  Occupational History    Employer: RETIRED  Tobacco Use   Smoking status: Never   Smokeless tobacco: Never  Vaping Use   Vaping Use: Never used  Substance and Sexual Activity   Alcohol use: No    Alcohol/week: 0.0 standard drinks    Comment: "last drink was in 2012"( 03/15/2014)   Drug use: No   Sexual activity: Not Currently  Other Topics Concern   Not on file  Social History Narrative   ** Merged History Encounter **       Divorced, lives with a roommate. 1 son one daughter 3-4 caffeinated beverages daily Right-handed. 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   Social Determinants of Health   Financial Resource Strain: Low Risk    Difficulty of Paying Living Expenses: Not hard at all  Food Insecurity: Not on file  Transportation Needs: Not on file  Physical Activity: Not on file  Stress: Not on file   Social Connections: Not on file  Intimate Partner Violence: Not on file    Family History: Family History  Problem Relation Age of Onset   Stroke Father    Hypertension Father    Pancreatic cancer Mother    Diabetes Maternal Grandmother    Stroke Maternal Grandmother    Heart attack Paternal Grandmother    Colon cancer Neg Hx    Esophageal cancer Neg Hx    Rectal cancer Neg Hx    Stomach cancer Neg Hx    Ulcers Neg Hx      Review of Systems: Review of systems complete and found to be negative unless listed in HPI.    Physical Exam: There were no vitals filed for this visit.   General:  Well appearing. No resp difficulty. HEENT: Normal Neck: Supple. JVP 5-6. Carotids 2+ bilat; no bruits. No thyromegaly or nodule noted. Cor: PMI nondisplaced. RRR, No M/G/R noted Lungs: CTAB, normal effort. Abdomen: Soft, non-tender, non-distended, no HSM. No bruits or masses. +BS   Extremities: No cyanosis, clubbing, or rash. R and LLE no edema. *** Neuro: Alert & orientedx3, cranial nerves grossly intact. moves all 4 extremities w/o difficulty. Affect pleasant   PPM Interrogation- Not checked today. At last visit. Did have intrinsic rhythm, CHB in 30s  EKG:  EKG is not ordered today.  Recent Labs: 02/19/2021: ALT 25; BUN 31; Creatinine 1.47; Hemoglobin 12.5; Platelet Count 178; Potassium 4.2; Sodium 141   Wt Readings from Last 3 Encounters:  02/19/21 243 lb (110.2 kg)  02/15/21 236 lb (107 kg)  02/13/21 241 lb (109.3 kg)     Other studies Reviewed: Additional studies/ records that were reviewed today include: Previous EP office notes, Previous remote  checks, Most recent labwork.   Assessment and Plan:  Aortic stenosis status post TAVR  Stable and asymptomatic.    Followed by gen cards.    Atrial Fibrillation persistent Burden <1% currently by most recent device interrogation.  Continue eliquis for CHA2DS2-VASc of at least 6.  He continues to fluctuate around the 1.5 Cr  limit of dose adjustment. Cr 02/19/21 1.47  Intermittent CHB s/p Pacemaker -Medtronic His bundle  Stable interrogation at last visit sevearl weeks ago. See PaceART He did not tolerate down-titration of his stable thresholds in the past. ? Less selective HIS capture   CAD s/p CABG  No s/s ischemia  Follows with Dr. Percival Spanish and lipid clinic.    OSA on CPAP   Non Hodgkins Lymphoma Diffuse  chemos Cx 2019  Completed chemotherapy, still has port. Management per hem/onc . Stable.   Anemia Pernicious  Hgb 12.5 02/19/21   Leg Discomfort He reports his leg discomfort is consistent with chronic neuropathy  Symptoms stable.    Left UE edema / likely subclavian stenosis/occlusion Venogram 02/15/21 with "severe" 85% subclavian vein stenosis at the area of pacemaker wire entry.  No other evidence of venous stenosis.  The vein appears patent and peripheral and central to the stenosis. *** Intervention would likely require a staged procedure with angiography to occur first, and alone. Angioplasty would very likely interfere with pacemaker function. He is NOT dependent today, but in CHB in 30s intrinsically.  If intervention felt needed based on angiography, could consider stepwise and possible approach to possible include consideration of leadless pacemaker given R sided port. Will update Dr. Caryl Comes as well. Dr. Quentin Ore saw the patient with me today.   Current medicines are reviewed at length with the patient today.   The patient does not have concerns regarding his medicines.  The following changes were made today:  none  Labs/ tests ordered today include:  No orders of the defined types were placed in this encounter.  Disposition:   Follow up made with Dr. Caryl Comes in 3 months as placeholder. Plan pending angiography.   Jacalyn Lefevre, PA-C  02/26/2021 9:00 AM  [address] [place] [address] [phone] (office) [phone] (fax)

## 2021-02-27 ENCOUNTER — Encounter: Payer: Medicare Other | Admitting: Student

## 2021-02-27 DIAGNOSIS — I480 Paroxysmal atrial fibrillation: Secondary | ICD-10-CM

## 2021-02-27 DIAGNOSIS — Z951 Presence of aortocoronary bypass graft: Secondary | ICD-10-CM

## 2021-02-27 DIAGNOSIS — I712 Thoracic aortic aneurysm, without rupture: Secondary | ICD-10-CM

## 2021-02-27 DIAGNOSIS — I871 Compression of vein: Secondary | ICD-10-CM

## 2021-02-27 DIAGNOSIS — Z950 Presence of cardiac pacemaker: Secondary | ICD-10-CM

## 2021-02-27 NOTE — Progress Notes (Signed)
MYRL, LAZARUS ([id number]) Visit Report for 02/23/2021 SuperBill Details Patient Name: Date of Service: Gavin Potters 02/23/2021 Medical Record Number: [id number] Patient Account Number: [id number] Date of Birth/Sex: Treating RN: 12-27-1940 (80 y.o. [name], [name] Primary Care Provider: Roma Schanz Other Clinician: Referring Provider: Treating Provider/Extender: Tharon Aquas in Treatment: 6 Diagnosis Coding ICD-10 Codes Code Description [phone] Unspecified open wound, right lower leg, subsequent encounter S81.802D Unspecified open wound, left lower leg, subsequent encounter Facility Procedures CPT4 Code Description Modifier Quantity [id number] - WOUND CARE VISIT-LEV 3 EST PT 1 Electronic Signature(s) Signed: 02/23/2021 11:24:47 AM By: Kalman Shan DO Signed: 02/27/2021 5:24:19 PM By: Rhae Hammock RN Entered By: Rhae Hammock on 02/23/2021 10:48:33

## 2021-02-27 NOTE — Progress Notes (Signed)
[name], [name] ([id number]) Visit Report for 02/23/2021 Arrival Information Details Patient Name: Date of Service: Richard [name] [name] 02/23/2021 10:30 A M Medical Record Number: [id number] Patient Account Number: [id number] Date of Birth/Sex: Treating RN: [birth year] (80 y.o. Richard [name], Richard [name] Primary Care Manny Vitolo: Roma Schanz Other Clinician: Referring Shenee Wignall: Treating Mehmet Scally/Extender: Tharon Aquas in Treatment: 6 Visit Information History Since Last Visit Added or deleted any medications: No Patient Arrived: Ambulatory Any new allergies or adverse reactions: No Arrival Time: 10:43 Had a fall or experienced change in No Accompanied By: self activities of daily living that may affect Transfer Assistance: None risk of falls: Patient Identification Verified: Yes Signs or symptoms of abuse/neglect since last visito No Secondary Verification Process Completed: Yes Hospitalized since last visit: No Patient Requires Transmission-Based Precautions: No Implantable device outside of the clinic excluding No Patient Has Alerts: Yes cellular tissue based products placed in the center Patient Alerts: Patient on Blood Thinner since last visit: Has Dressing in Place as Prescribed: Yes Has Compression in Place as Prescribed: Yes Pain Present Now: No Electronic Signature(s) Signed: 02/27/2021 5:24:19 PM By: Rhae Hammock RN Entered By: Rhae Hammock on 02/23/2021 10:43:45 -------------------------------------------------------------------------------- Clinic Level of Care Assessment Details Patient Name: Date of Service: Richard [name] [name] 02/23/2021 10:30 A M Medical Record Number: [id number] Patient Account Number: [id number] Date of Birth/Sex: Treating RN: 1941/02/26 (80 y.o. Richard [name], Richard [name] Primary Care Valen Gillison: Roma Schanz Other Clinician: Referring Richard [name]: Treating Richard [name]/Extender: Tharon Aquas in Treatment: 6 Clinic Level of Care Assessment Items TOOL 4 Quantity Score X- 1 0 Use when only an EandM is performed on FOLLOW-UP visit ASSESSMENTS - Nursing Assessment / Reassessment X- 1 10 Reassessment of Co-morbidities (includes updates in patient status) X- 1 5 Reassessment of Adherence to Treatment Plan ASSESSMENTS - Wound and Skin A ssessment / Reassessment X - Simple Wound Assessment / Reassessment - one wound 1 5 []  - 0 Complex Wound Assessment / Reassessment - multiple wounds []  - 0 Dermatologic / Skin Assessment (not related to wound area) ASSESSMENTS - Focused Assessment []  - 0 Circumferential Edema Measurements - multi extremities []  - 0 Nutritional Assessment / Counseling / Intervention []  - 0 Lower Extremity Assessment (monofilament, tuning fork, pulses) []  - 0 Peripheral Arterial Disease Assessment (using hand held doppler) ASSESSMENTS - Ostomy and/or Continence Assessment and Care []  - 0 Incontinence Assessment and Management []  - 0 Ostomy Care Assessment and Management (repouching, etc.) PROCESS - Coordination of Care X - Simple Patient / Family Education for ongoing care 1 15 []  - 0 Complex (extensive) Patient / Family Education for ongoing care X- 1 10 Staff obtains Programmer, systems, Records, T Results / Process Orders est []  - 0 Staff telephones HHA, Nursing Homes / Clarify orders / etc []  - 0 Routine Transfer to another Facility (non-emergent condition) []  - 0 Routine Hospital Admission (non-emergent condition) []  - 0 New Admissions / Biomedical engineer / Ordering NPWT Apligraf, etc. , []  - 0 Emergency Hospital Admission (emergent condition) X- 1 10 Simple Discharge Coordination []  - 0 Complex (extensive) Discharge Coordination PROCESS - Special Needs []  - 0 Pediatric / Minor Patient Management []  - 0 Isolation Patient Management []  - 0 Hearing / Language / Visual special needs []  - 0 Assessment of Community assistance  (transportation, D/C planning, etc.) []  - 0 Additional assistance / Altered mentation []  - 0 Support Surface(s) Assessment (bed, cushion, seat, etc.) INTERVENTIONS - Wound Cleansing / Measurement X - Simple  Wound Cleansing - one wound 1 5 []  - 0 Complex Wound Cleansing - multiple wounds []  - 0 Wound Imaging (photographs - any number of wounds) []  - 0 Wound Tracing (instead of photographs) X- 1 5 Simple Wound Measurement - one wound []  - 0 Complex Wound Measurement - multiple wounds INTERVENTIONS - Wound Dressings []  - 0 Small Wound Dressing one or multiple wounds X- 1 15 Medium Wound Dressing one or multiple wounds []  - 0 Large Wound Dressing one or multiple wounds []  - 0 Application of Medications - topical []  - 0 Application of Medications - injection INTERVENTIONS - Miscellaneous []  - 0 External ear exam []  - 0 Specimen Collection (cultures, biopsies, blood, body fluids, etc.) []  - 0 Specimen(s) / Culture(s) sent or taken to Lab for analysis []  - 0 Patient Transfer (multiple staff / Civil Service fast streamer / Similar devices) []  - 0 Simple Staple / Suture removal (25 or less) []  - 0 Complex Staple / Suture removal (26 or more) []  - 0 Hypo / Hyperglycemic Management (close monitor of Blood Glucose) []  - 0 Ankle / Brachial Index (ABI) - do not check if billed separately X- 1 5 Vital Signs Has the patient been seen at the hospital within the last three years: Yes Total Score: 85 Level Of Care: New/Established - Level 3 Electronic Signature(s) Signed: 02/27/2021 5:24:19 PM By: Rhae Hammock RN Entered By: Rhae Hammock on 02/23/2021 10:48:28 -------------------------------------------------------------------------------- Encounter Discharge Information Details Patient Name: Date of Service: Richard [name], Richard [name] [name] 02/23/2021 10:30 A M Medical Record Number: [id number] Patient Account Number: [id number] Date of Birth/Sex: Treating RN: Nov 25, 1940 (80 y.o. Richard [name],  Richard [name] Primary Care Andrienne Havener: Roma Schanz Other Clinician: Referring Richard [name]: Treating Supriya Beaston/Extender: Tharon Aquas in Treatment: 6 Encounter Discharge Information Items Discharge Condition: Stable Ambulatory Status: Ambulatory Discharge Destination: Home Transportation: Private Auto Accompanied By: self Schedule Follow-up Appointment: Yes Clinical Summary of Care: Patient Declined Electronic Signature(s) Signed: 02/27/2021 5:24:19 PM By: Rhae Hammock RN Entered By: Rhae Hammock on 02/23/2021 10:47:58 -------------------------------------------------------------------------------- Patient/Caregiver Education Details Patient Name: Date of Service: Gavin Potters 8/26/2022andnbsp10:30 A M Medical Record Number: [id number] Patient Account Number: [id number] Date of Birth/Gender: Treating RN: 27-May-1941 (80 y.o. Erie Noe Primary Care Physician: Roma Schanz Other Clinician: Referring Physician: Treating Physician/Extender: Tharon Aquas in Treatment: 6 Education Assessment Education Provided To: Patient and Caregiver Education Topics Provided Wound/Skin Impairment: Methods: Explain/Verbal Responses: State content correctly Electronic Signature(s) Signed: 02/27/2021 5:24:19 PM By: Rhae Hammock RN Entered By: Rhae Hammock on 02/23/2021 10:46:24 -------------------------------------------------------------------------------- Wound Assessment Details Patient Name: Date of Service: Richard [name] [name] 02/23/2021 10:30 A M Medical Record Number: [id number] Patient Account Number: [id number] Date of Birth/Sex: Treating RN: [birth year] (80 y.o. Richard [name], Richard [name] Primary Care Andrew Soria: Roma Schanz Other Clinician: Referring Alessia Gonsalez: Treating Lasheka Kempner/Extender: Tharon Aquas in Treatment: 6 Wound Status Wound Number: 2 Primary  Etiology: Trauma, Other Wound Location: Right, Posterior Lower Leg Wound Status: Open Wounding Event: Trauma Date Acquired: 12/24/2020 Weeks Of Treatment: 6 Clustered Wound: No Wound Measurements Length: (cm) 2 Width: (cm) 2 Depth: (cm) 0.1 Area: (cm) 3.142 Volume: (cm) 0.314 % Reduction in Area: 77.1% % Reduction in Volume: 88.6% Wound Description Classification: Full Thickness Without Exposed Support Structu Exudate Amount: Medium Exudate Type: Serosanguineous Exudate Color: red, brown res Treatment Notes Wound #2 (Lower Leg) Wound Laterality: Right, Posterior Cleanser Peri-Wound Care Sween Lotion (Moisturizing lotion) Discharge Instruction: Apply moisturizing lotion as directed Topical Primary  Dressing Hydrofera Blue Ready Foam, 2.5 x2.5 in Discharge Instruction: Apply to wound bed as instructed Santyl Ointment Discharge Instruction: Apply nickel thick amount to wound bed as instructed Secondary Dressing Woven Gauze Sponge, Non-Sterile 4x4 in Discharge Instruction: Apply over primary dressing as directed. Secured With Compression Wrap Kerlix Roll 4.5x3.1 (in/yd) Discharge Instruction: Apply Kerlix and Coban compression as directed. Coban Self-Adherent Wrap 4x5 (in/yd) Discharge Instruction: Apply over Kerlix as directed. Compression Stockings Add-Ons Electronic Signature(s) Signed: 02/27/2021 5:24:19 PM By: Rhae Hammock RN Entered By: Rhae Hammock on 02/23/2021 10:44:27 -------------------------------------------------------------------------------- Vitals Details Patient Name: Date of Service: Richard [name], Richard [name] [name] 02/23/2021 10:30 A M Medical Record Number: [id number] Patient Account Number: [id number] Date of Birth/Sex: Treating RN: Sep 16, 1940 (80 y.o. Richard [name], Richard [name] Primary Care Denilson Salminen: Roma Schanz Other Clinician: Referring Tige Meas: Treating Dericka Ostenson/Extender: Tharon Aquas in Treatment: 6 Vital  Signs Time Taken: 10:43 Temperature (F): 98.4 Height (in): 72 Pulse (bpm): 74 Weight (lbs): 235 Respiratory Rate (breaths/min): 17 Body Mass Index (BMI): 31.9 Blood Pressure (mmHg): 134/73 Capillary Blood Glucose (mg/dl): 143 Reference Range: 80 - 120 mg / dl Electronic Signature(s) Signed: 02/27/2021 5:24:19 PM By: Rhae Hammock RN Entered By: Rhae Hammock on 02/23/2021 10:44:13

## 2021-03-01 ENCOUNTER — Encounter (HOSPITAL_BASED_OUTPATIENT_CLINIC_OR_DEPARTMENT_OTHER): Payer: Medicare Other | Attending: Internal Medicine | Admitting: Internal Medicine

## 2021-03-01 ENCOUNTER — Other Ambulatory Visit: Payer: Self-pay

## 2021-03-01 DIAGNOSIS — S81801A Unspecified open wound, right lower leg, initial encounter: Secondary | ICD-10-CM | POA: Diagnosis not present

## 2021-03-01 DIAGNOSIS — X58XXXA Exposure to other specified factors, initial encounter: Secondary | ICD-10-CM | POA: Diagnosis not present

## 2021-03-01 DIAGNOSIS — S81801D Unspecified open wound, right lower leg, subsequent encounter: Secondary | ICD-10-CM

## 2021-03-01 DIAGNOSIS — Z09 Encounter for follow-up examination after completed treatment for conditions other than malignant neoplasm: Secondary | ICD-10-CM | POA: Insufficient documentation

## 2021-03-01 NOTE — Progress Notes (Signed)
NEVIN, GRIZZLE ([id number]) Visit Report for 03/01/2021 Arrival Information Details Patient Name: Date of Service: Richard [name] 03/01/2021 1:45 PM Medical Record Number: [id number] Patient Account Number: [id number] Date of Birth/Sex: Treating RN: 02/03/1941 (80 y.o. Marcheta Grammes Primary Care Adael Culbreath: Roma Schanz Other Clinician: Referring Salinda Snedeker: Treating Matisse Salais/Extender: Tharon Aquas in Treatment: 6 Visit Information History Since Last Visit Added or deleted any medications: No Patient Arrived: Ambulatory Any new allergies or adverse reactions: No Arrival Time: 13:54 Had a fall or experienced change in No Transfer Assistance: None activities of daily living that may affect Patient Identification Verified: Yes risk of falls: Secondary Verification Process Completed: Yes Signs or symptoms of abuse/neglect since last visito No Patient Requires Transmission-Based Precautions: No Hospitalized since last visit: No Patient Has Alerts: Yes Implantable device outside of the clinic excluding No Patient Alerts: Patient on Blood Thinner cellular tissue based products placed in the center since last visit: Has Dressing in Place as Prescribed: Yes Has Compression in Place as Prescribed: Yes Pain Present Now: No Electronic Signature(s) Signed: 03/01/2021 4:37:51 PM By: Lorrin Jackson Entered By: Lorrin Jackson on 03/01/2021 13:56:20 -------------------------------------------------------------------------------- Clinic Level of Care Assessment Details Patient Name: Date of Service: [name] 03/01/2021 1:45 PM Medical Record Number: [id number] Patient Account Number: [id number] Date of Birth/Sex: Treating RN: July 21, 1940 (80 y.o. [name], [name] Primary Care Mckensey Berghuis: Roma Schanz Other Clinician: Referring Courtne Lighty: Treating Catherene Kaleta/Extender: Tharon Aquas in Treatment: 6 Clinic Level of  Care Assessment Items TOOL 4 Quantity Score X- 1 0 Use when only an EandM is performed on FOLLOW-UP visit ASSESSMENTS - Nursing Assessment / Reassessment X- 1 10 Reassessment of Co-morbidities (includes updates in patient status) X- 1 5 Reassessment of Adherence to Treatment Plan ASSESSMENTS - Wound and Skin A ssessment / Reassessment X - Simple Wound Assessment / Reassessment - one wound 1 5 []  - 0 Complex Wound Assessment / Reassessment - multiple wounds X- 1 10 Dermatologic / Skin Assessment (not related to wound area) ASSESSMENTS - Focused Assessment X- 1 5 Circumferential Edema Measurements - multi extremities []  - 0 Nutritional Assessment / Counseling / Intervention []  - 0 Lower Extremity Assessment (monofilament, tuning fork, pulses) []  - 0 Peripheral Arterial Disease Assessment (using hand held doppler) ASSESSMENTS - Ostomy and/or Continence Assessment and Care []  - 0 Incontinence Assessment and Management []  - 0 Ostomy Care Assessment and Management (repouching, etc.) PROCESS - Coordination of Care X - Simple Patient / Family Education for ongoing care 1 15 []  - 0 Complex (extensive) Patient / Family Education for ongoing care X- 1 10 Staff obtains Programmer, systems, Records, T Results / Process Orders est []  - 0 Staff telephones HHA, Nursing Homes / Clarify orders / etc []  - 0 Routine Transfer to another Facility (non-emergent condition) []  - 0 Routine Hospital Admission (non-emergent condition) []  - 0 New Admissions / Biomedical engineer / Ordering NPWT Apligraf, etc. , []  - 0 Emergency Hospital Admission (emergent condition) X- 1 10 Simple Discharge Coordination []  - 0 Complex (extensive) Discharge Coordination PROCESS - Special Needs []  - 0 Pediatric / Minor Patient Management []  - 0 Isolation Patient Management []  - 0 Hearing / Language / Visual special needs []  - 0 Assessment of Community assistance (transportation, D/C planning, etc.) []  -  0 Additional assistance / Altered mentation []  - 0 Support Surface(s) Assessment (bed, cushion, seat, etc.) INTERVENTIONS - Wound Cleansing / Measurement X - Simple Wound Cleansing - one wound 1  5 []  - 0 Complex Wound Cleansing - multiple wounds X- 1 5 Wound Imaging (photographs - any number of wounds) []  - 0 Wound Tracing (instead of photographs) X- 1 5 Simple Wound Measurement - one wound []  - 0 Complex Wound Measurement - multiple wounds INTERVENTIONS - Wound Dressings X - Small Wound Dressing one or multiple wounds 1 10 []  - 0 Medium Wound Dressing one or multiple wounds []  - 0 Large Wound Dressing one or multiple wounds X- 1 5 Application of Medications - topical []  - 0 Application of Medications - injection INTERVENTIONS - Miscellaneous []  - 0 External ear exam []  - 0 Specimen Collection (cultures, biopsies, blood, body fluids, etc.) []  - 0 Specimen(s) / Culture(s) sent or taken to Lab for analysis []  - 0 Patient Transfer (multiple staff / Civil Service fast streamer / Similar devices) []  - 0 Simple Staple / Suture removal (25 or less) []  - 0 Complex Staple / Suture removal (26 or more) []  - 0 Hypo / Hyperglycemic Management (close monitor of Blood Glucose) []  - 0 Ankle / Brachial Index (ABI) - do not check if billed separately X- 1 5 Vital Signs Has the patient been seen at the hospital within the last three years: Yes Total Score: 105 Level Of Care: New/Established - Level 3 Electronic Signature(s) Signed: 03/01/2021 4:23:50 PM By: Rhae Hammock RN Entered By: Rhae Hammock on 03/01/2021 15:04:02 -------------------------------------------------------------------------------- Encounter Discharge Information Details Patient Name: Date of Service: [name], [name] 03/01/2021 1:45 PM Medical Record Number: [id number] Patient Account Number: [id number] Date of Birth/Sex: Treating RN: 11/30/1940 (80 y.o. [name], [name] Primary Care Maxwell Martorano: Roma Schanz  Other Clinician: Referring Marelin Tat: Treating Lenay Lovejoy/Extender: Tharon Aquas in Treatment: 6 Encounter Discharge Information Items Discharge Condition: Stable Ambulatory Status: Ambulatory Discharge Destination: Home Transportation: Private Auto Accompanied By: SELF Schedule Follow-up Appointment: Yes Clinical Summary of Care: Electronic Signature(s) Signed: 03/01/2021 4:23:50 PM By: Rhae Hammock RN Entered By: Rhae Hammock on 03/01/2021 15:04:48 -------------------------------------------------------------------------------- Lower Extremity Assessment Details Patient Name: Date of Service: [name] 03/01/2021 1:45 PM Medical Record Number: [id number] Patient Account Number: [id number] Date of Birth/Sex: Treating RN: 1941/01/04 (80 y.o. Marcheta Grammes Primary Care Tenaya Hilyer: Roma Schanz Other Clinician: Referring Bethzy Hauck: Treating Syrianna Schillaci/Extender: Tharon Aquas in Treatment: 6 Edema Assessment Assessed: Shirlyn Goltz: No] Patrice Paradise: Yes] Edema: [Left: N] [Right: o] Calf Left: Right: Point of Measurement: 30 cm From Medial Instep 33 cm Ankle Left: Right: Point of Measurement: 9 cm From Medial Instep 23 cm Vascular Assessment Pulses: Dorsalis Pedis Palpable: [Right:Yes] Electronic Signature(s) Signed: 03/01/2021 4:37:51 PM By: Lorrin Jackson Entered By: Lorrin Jackson on 03/01/2021 14:08:18 -------------------------------------------------------------------------------- Multi Wound Chart Details Patient Name: Date of Service: [name], [name] 03/01/2021 1:45 PM Medical Record Number: [id number] Patient Account Number: [id number] Date of Birth/Sex: Treating RN: July 25, 1940 (80 y.o. [name] Primary Care Westley Blass: Roma Schanz Other Clinician: Referring Marisela Line: Treating Liviah Cake/Extender: Tharon Aquas in Treatment: 6 Vital Signs Height(in):  72 Capillary Blood Glucose(mg/dl): 127 Weight(lbs): 235 Pulse(bpm): 23 Body Mass Index(BMI): 32 Blood Pressure(mmHg): 131/71 Temperature(F): 97.8 Respiratory Rate(breaths/min): 18 Photos: [N/A:N/A] Right, Posterior Lower Leg N/A N/A Wound Location: Trauma N/A N/A Wounding Event: Trauma, Other N/A N/A Primary Etiology: Cataracts, Anemia, Sleep Apnea, N/A N/A Comorbid History: Arrhythmia, Congestive Heart Failure, Coronary Artery Disease, Hypertension, Type II Diabetes, Osteoarthritis, Neuropathy, Received Chemotherapy 12/24/2020 N/A N/A Date Acquired: 6 N/A N/A Weeks of Treatment: Open N/A N/A Wound Status: 1.8x2x0.1 N/A  N/A Measurements L x W x D (cm) 2.827 N/A N/A A (cm) : rea 0.283 N/A N/A Volume (cm) : 79.40% N/A N/A % Reduction in Area: 89.70% N/A N/A % Reduction in Volume: Full Thickness Without Exposed N/A N/A Classification: Support Structures Medium N/A N/A Exudate Amount: Serosanguineous N/A N/A Exudate Type: red, brown N/A N/A Exudate Color: Distinct, outline attached N/A N/A Wound Margin: Medium (34-66%) N/A N/A Granulation Amount: Red, Pink N/A N/A Granulation Quality: Medium (34-66%) N/A N/A Necrotic Amount: Fat Layer (Subcutaneous Tissue): Yes N/A N/A Exposed Structures: Fascia: No Tendon: No Muscle: No Joint: No Bone: No Medium (34-66%) N/A N/A Epithelialization: Treatment Notes Electronic Signature(s) Signed: 03/01/2021 3:00:24 PM By: Kalman Shan DO Signed: 03/01/2021 5:36:38 PM By: Deon Pilling Entered By: Kalman Shan on 03/01/2021 14:55:33 -------------------------------------------------------------------------------- Multi-Disciplinary Care Plan Details Patient Name: Date of Service: [name], [name] 03/01/2021 1:45 PM Medical Record Number: [id number] Patient Account Number: [id number] Date of Birth/Sex: Treating RN: 06-Nov-1940 (80 y.o. Marcheta Grammes Primary Care Esaias Cleavenger: Roma Schanz Other  Clinician: Referring Haydon Kalmar: Treating Laiken Nohr/Extender: Tharon Aquas in Treatment: [place] reviewed with physician Active Inactive Wound/Skin Impairment Nursing Diagnoses: Impaired tissue integrity Knowledge deficit related to ulceration/compromised skin integrity Goals: Patient/caregiver will verbalize understanding of skin care regimen Date Initiated: 01/12/2021 Target Resolution Date: 03/29/2021 Goal Status: Active Ulcer/skin breakdown will have a volume reduction of 30% by week 4 Date Initiated: 01/12/2021 Date Inactivated: 02/16/2021 Target Resolution Date: 02/09/2021 Goal Status: Met Ulcer/skin breakdown will have a volume reduction of 80% by week 12 Date Initiated: 02/16/2021 Date Inactivated: 03/01/2021 Target Resolution Date: 03/16/2021 Goal Status: Met Ulcer/skin breakdown will heal within 14 weeks Date Initiated: 03/01/2021 Target Resolution Date: 04/05/2021 Goal Status: Active Interventions: Assess patient/caregiver ability to obtain necessary supplies Assess patient/caregiver ability to perform ulcer/skin care regimen upon admission and as needed Assess ulceration(s) every visit Provide education on ulcer and skin care Treatment Activities: Skin care regimen initiated : 01/12/2021 Topical wound management initiated : 01/12/2021 Notes: Electronic Signature(s) Signed: 03/01/2021 4:37:51 PM By: Lorrin Jackson Entered By: Lorrin Jackson on 03/01/2021 14:09:59 -------------------------------------------------------------------------------- Pain Assessment Details Patient Name: Date of Service: [name] 03/01/2021 1:45 PM Medical Record Number: [id number] Patient Account Number: [id number] Date of Birth/Sex: Treating RN: [birth year] (80 y.o. Marcheta Grammes Primary Care Twilla Khouri: Roma Schanz Other Clinician: Referring Iriel Nason: Treating Georgeanna Radziewicz/Extender: Tharon Aquas in  Treatment: 6 Active Problems Location of Pain Severity and Description of Pain Patient Has Paino No Site Locations Pain Management and Medication Current Pain Management: Electronic Signature(s) Signed: 03/01/2021 4:37:51 PM By: Lorrin Jackson Entered By: Lorrin Jackson on 03/01/2021 13:58:41 -------------------------------------------------------------------------------- Patient/Caregiver Education Details Patient Name: Date of Service: Richard [name] 9/1/2022andnbsp1:45 PM Medical Record Number: [id number] Patient Account Number: [id number] Date of Birth/Gender: Treating RN: [birth year] (80 y.o. Marcheta Grammes Primary Care Physician: Roma Schanz Other Clinician: Referring Physician: Treating Physician/Extender: Tharon Aquas in Treatment: 6 Education Assessment Education Provided To: Patient Education Topics Provided Venous: Methods: Explain/Verbal, Printed Responses: State content correctly Wound/Skin Impairment: Methods: Explain/Verbal, Printed Responses: State content correctly Electronic Signature(s) Signed: 03/01/2021 4:37:51 PM By: Lorrin Jackson Entered By: Lorrin Jackson on 03/01/2021 14:10:23 -------------------------------------------------------------------------------- Wound Assessment Details Patient Name: Date of Service: [name] 03/01/2021 1:45 PM Medical Record Number: [id number] Patient Account Number: [id number] Date of Birth/Sex: Treating RN: 04-Jan-1941 (80 y.o. Marcheta Grammes Primary Care Araya Roel: Roma Schanz Other Clinician: Referring Kaytelynn Scripter: Treating  Costa Jha/Extender: Melina Fiddler Weeks in Treatment: 6 Wound Status Wound Number: 2 Primary Trauma, Other Etiology: Wound Location: Right, Posterior Lower Leg Wound Open Wounding Event: Trauma Status: Date Acquired: 12/24/2020 Comorbid Cataracts, Anemia, Sleep Apnea, Arrhythmia, Congestive Heart Weeks Of  Treatment: 6 History: Failure, Coronary Artery Disease, Hypertension, Type II Diabetes, Clustered Wound: No Osteoarthritis, Neuropathy, Received Chemotherapy Photos Wound Measurements Length: (cm) 1.8 Width: (cm) 2 Depth: (cm) 0.1 Area: (cm) 2.827 Volume: (cm) 0.283 % Reduction in Area: 79.4% % Reduction in Volume: 89.7% Epithelialization: Medium (34-66%) Tunneling: No Undermining: No Wound Description Classification: Full Thickness Without Exposed Support Structures Wound Margin: Distinct, outline attached Exudate Amount: Medium Exudate Type: Serosanguineous Exudate Color: red, brown Foul Odor After Cleansing: No Slough/Fibrino Yes Wound Bed Granulation Amount: Medium (34-66%) Exposed Structure Granulation Quality: Red, Pink Fascia Exposed: No Necrotic Amount: Medium (34-66%) Fat Layer (Subcutaneous Tissue) Exposed: Yes Necrotic Quality: Adherent Slough Tendon Exposed: No Muscle Exposed: No Joint Exposed: No Bone Exposed: No Treatment Notes Wound #2 (Lower Leg) Wound Laterality: Right, Posterior Cleanser Peri-Wound Care Sween Lotion (Moisturizing lotion) Discharge Instruction: Apply moisturizing lotion as directed Topical Primary Dressing Hydrofera Blue Ready Foam, 2.5 x2.5 in Discharge Instruction: Apply to wound bed as instructed Santyl Ointment Discharge Instruction: Apply nickel thick amount to wound bed as instructed Secondary Dressing Woven Gauze Sponge, Non-Sterile 4x4 in Discharge Instruction: Apply over primary dressing as directed. Secured With Compression Wrap Kerlix Roll 4.5x3.1 (in/yd) Discharge Instruction: Apply Kerlix and Coban compression as directed. Coban Self-Adherent Wrap 4x5 (in/yd) Discharge Instruction: Apply over Kerlix as directed. Compression Stockings Add-Ons Electronic Signature(s) Signed: 03/01/2021 4:37:51 PM By: Lorrin Jackson Entered By: Lorrin Jackson on 03/01/2021  14:11:53 -------------------------------------------------------------------------------- Vitals Details Patient Name: Date of Service: [name], [name] 03/01/2021 1:45 PM Medical Record Number: [id number] Patient Account Number: [id number] Date of Birth/Sex: Treating RN: 1940/09/07 (80 y.o. Marcheta Grammes Primary Care Terry Abila: Roma Schanz Other Clinician: Referring Sixto Bowdish: Treating Halden Phegley/Extender: Tharon Aquas in Treatment: 6 Vital Signs Time Taken: 13:56 Temperature (F): 97.8 Height (in): 72 Pulse (bpm): 84 Weight (lbs): 235 Respiratory Rate (breaths/min): 18 Body Mass Index (BMI): 31.9 Blood Pressure (mmHg): 131/71 Capillary Blood Glucose (mg/dl): 127 Reference Range: 80 - 120 mg / dl Electronic Signature(s) Signed: 03/01/2021 4:37:51 PM By: Lorrin Jackson Entered By: Lorrin Jackson on 03/01/2021 13:58:33

## 2021-03-01 NOTE — Progress Notes (Signed)
[name], [name] ([id number]) Visit Report for 03/01/2021 Chief Complaint Document Details Patient Name: Date of Service: Richard [name] [name] 03/01/2021 1:45 PM Medical Record Number: [id number] Patient Account Number: [id number] Date of Birth/Sex: Treating RN: 02-25-1941 (80 y.o. [name] Primary Care Provider: Roma Schanz Other Clinician: Referring Provider: Treating Provider/Extender: Tharon Aquas in Treatment: 6 Information Obtained from: Patient Chief Complaint Wounds to his left upper extremity and bilateral lower extremities. Electronic Signature(s) Signed: 03/01/2021 3:00:24 PM By: Kalman Shan DO Entered By: Kalman Shan on 03/01/2021 14:55:44 -------------------------------------------------------------------------------- HPI Details Patient Name: Date of Service: [name], [name] 03/01/2021 1:45 PM Medical Record Number: [id number] Patient Account Number: [id number] Date of Birth/Sex: Treating RN: 10-09-1940 (80 y.o. [name] Primary Care Provider: Roma Schanz Other Clinician: Referring Provider: Treating Provider/Extender: Tharon Aquas in Treatment: 6 History of Present Illness HPI Description: Admission 7/15 Mr. Ariston Etchison is an 80 year old male with a past medical history of type 2 diabetes on oral agents, diffuse large B-cell lymphoma, and coronary artery disease status post CABG that presents to the clinic for wounds to his left upper extremity and bilateral lower extremities. He states that he went to urgent care to be evaluated for a fever. And he was instructed to go to the ED to be further evaluated for possible sepsis. Unfortunately he passed out in his car After his appointment and was not discovered for another 3 hours. He was eventually discovered and EMS had to take him out of his vehicle. This is when he developed abrasions to his left arm and legs. He has been  using Xeroform to his wound beds every other day. He reports improvement to all the wounds except for the one on the back of his right leg. He denies pain or signs of infection. 7/22; patient presents for 1 week follow-up. He has no issues or complaints today. He has tolerated the compression wrap well on his right lower extremity. He has been able to do the dressing changes without issues to his left leg and left arm. His left arm wound has healed. He denies signs of infection. 7/29; patient presents for 1 week follow-up. He reports that his left leg wound is healed. He tolerated the compression wrap well on his right leg. He has no issues or complaints today. He denies signs of infection. 8/4; patient presents for 1 week follow-up. He continues to see improvement in wound healing to his right lower extremity. He has no issues or complaints today. He denies signs of infection. He does state that he went to the dermatologist and had some lesions removed on his face and left leg. He has been keeping these covered with a Band-Aid. 8/19; patient presents for follow-up. He is tolerated the compression wrap well. He has no issues or complaints today. He now only has 1 remaining wound. 9/1; patient presents for 1 week follow-up. He has tolerated the compression wrap well and has no issues or complaints today. He has been golfing 3 times weekly. He is in good spirits today. Electronic Signature(s) Signed: 03/01/2021 3:00:24 PM By: Kalman Shan DO Entered By: Kalman Shan on 03/01/2021 14:58:08 -------------------------------------------------------------------------------- Physical Exam Details Patient Name: Date of Service: Richard [name] [name] 03/01/2021 1:45 PM Medical Record Number: [id number] Patient Account Number: [id number] Date of Birth/Sex: Treating RN: [birth year] (80 y.o. [name] Primary Care Provider: Roma Schanz Other Clinician: Referring Provider: Treating  Provider/Extender: Tharon Aquas  in Treatment: 6 Constitutional respirations regular, non-labored and within target range for patient.. Cardiovascular 2+ dorsalis pedis/posterior tibialis pulses. Psychiatric pleasant and cooperative. Notes Right lower extremity: T the posterior aspect there is an open wound with granulation tissue and scant nonviable tissue present. No signs of infection. o Electronic Signature(s) Signed: 03/01/2021 3:00:24 PM By: Kalman Shan DO Entered By: Kalman Shan on 03/01/2021 14:58:38 -------------------------------------------------------------------------------- Physician Orders Details Patient Name: Date of Service: [name], [name] 03/01/2021 1:45 PM Medical Record Number: [id number] Patient Account Number: [id number] Date of Birth/Sex: Treating RN: 1941/02/04 (80 y.o. [name], [name] Primary Care Provider: Roma Schanz Other Clinician: Referring Provider: Treating Provider/Extender: Tharon Aquas in Treatment: 6 Verbal / Phone Orders: No Diagnosis Coding ICD-10 Coding Code Description S81.801D Unspecified open wound, right lower leg, subsequent encounter S81.802D Unspecified open wound, left lower leg, subsequent encounter Follow-up Appointments ppointment in 2 weeks. - Dr. Heber [place] 03/02/2021 Return A Nurse Visit: - next week Bathing/ Shower/ Hygiene May shower with protection but do not get wound dressing(s) wet. Edema Control - Lymphedema / SCD / Other Bilateral Lower Extremities Elevate legs to the level of the heart or above for 30 minutes daily and/or when sitting, a frequency of: - throughout the day Avoid standing for long periods of time. Exercise regularly Moisturize legs daily. - left leg daily Wound Treatment Wound #2 - Lower Leg Wound Laterality: Right, Posterior Peri-Wound Care: Sween Lotion (Moisturizing lotion) 1 x Per Week/15 Days Discharge  Instructions: Apply moisturizing lotion as directed Prim Dressing: Hydrofera Blue Ready Foam, 2.5 x2.5 in 1 x Per Week/15 Days ary Discharge Instructions: Apply to wound bed as instructed Prim Dressing: Santyl Ointment 1 x Per Week/15 Days ary Discharge Instructions: Apply nickel thick amount to wound bed as instructed Secondary Dressing: Woven Gauze Sponge, Non-Sterile 4x4 in 1 x Per Week/15 Days Discharge Instructions: Apply over primary dressing as directed. Compression Wrap: Kerlix Roll 4.5x3.1 (in/yd) 1 x Per Week/15 Days Discharge Instructions: Apply Kerlix and Coban compression as directed. Compression Wrap: Coban Self-Adherent Wrap 4x5 (in/yd) 1 x Per Week/15 Days Discharge Instructions: Apply over Kerlix as directed. Electronic Signature(s) Signed: 03/01/2021 3:00:24 PM By: Kalman Shan DO Entered By: Kalman Shan on 03/01/2021 14:58:51 -------------------------------------------------------------------------------- Problem List Details Patient Name: Date of Service: [name], [name] 03/01/2021 1:45 PM Medical Record Number: [id number] Patient Account Number: [id number] Date of Birth/Sex: Treating RN: 1941/04/12 (80 y.o. Marcheta Grammes Primary Care Provider: Roma Schanz Other Clinician: Referring Provider: Treating Provider/Extender: Tharon Aquas in Treatment: 6 Active Problems ICD-10 Encounter Code Description Active Date MDM Diagnosis S81.801D Unspecified open wound, right lower leg, subsequent encounter 01/19/2021 No Yes Inactive Problems ICD-10 Code Description Active Date Inactive Date S40.812A Abrasion of left upper arm, initial encounter 01/12/2021 01/12/2021 S81.801A Unspecified open wound, right lower leg, initial encounter 01/12/2021 01/12/2021 I43.329J Unspecified open wound, left lower leg, initial encounter 01/12/2021 01/12/2021 S81.802D Unspecified open wound, left lower leg, subsequent encounter 01/19/2021  01/19/2021 Resolved Problems ICD-10 Code Description Active Date Resolved Date S40.812D Abrasion of left upper arm, subsequent encounter 01/19/2021 01/19/2021 Electronic Signature(s) Signed: 03/01/2021 3:00:24 PM By: Kalman Shan DO Entered By: Kalman Shan on 03/01/2021 14:55:27 -------------------------------------------------------------------------------- Progress Note Details Patient Name: Date of Service: [name], [name] 03/01/2021 1:45 PM Medical Record Number: [id number] Patient Account Number: [id number] Date of Birth/Sex: Treating RN: [birth year] (80 y.o. [name] Primary Care Provider: Roma Schanz Other Clinician: Referring Provider: Treating Provider/Extender: Melina Fiddler  Weeks in Treatment: 6 Subjective Chief Complaint Information obtained from Patient Wounds to his left upper extremity and bilateral lower extremities. History of Present Illness (HPI) Admission 7/15 Mr. Lason Sermeno is an 80 year old male with a past medical history of type 2 diabetes on oral agents, diffuse large B-cell lymphoma, and coronary artery disease status post CABG that presents to the clinic for wounds to his left upper extremity and bilateral lower extremities. He states that he went to urgent care to be evaluated for a fever. And he was instructed to go to the ED to be further evaluated for possible sepsis. Unfortunately he passed out in his car After his appointment and was not discovered for another 3 hours. He was eventually discovered and EMS had to take him out of his vehicle. This is when he developed abrasions to his left arm and legs. He has been using Xeroform to his wound beds every other day. He reports improvement to all the wounds except for the one on the back of his right leg. He denies pain or signs of infection. 7/22; patient presents for 1 week follow-up. He has no issues or complaints today. He has tolerated the compression wrap  well on his right lower extremity. He has been able to do the dressing changes without issues to his left leg and left arm. His left arm wound has healed. He denies signs of infection. 7/29; patient presents for 1 week follow-up. He reports that his left leg wound is healed. He tolerated the compression wrap well on his right leg. He has no issues or complaints today. He denies signs of infection. 8/4; patient presents for 1 week follow-up. He continues to see improvement in wound healing to his right lower extremity. He has no issues or complaints today. He denies signs of infection. He does state that he went to the dermatologist and had some lesions removed on his face and left leg. He has been keeping these covered with a Band-Aid. 8/19; patient presents for follow-up. He is tolerated the compression wrap well. He has no issues or complaints today. He now only has 1 remaining wound. 9/1; patient presents for 1 week follow-up. He has tolerated the compression wrap well and has no issues or complaints today. He has been golfing 3 times weekly. He is in good spirits today. Patient History Information obtained from Patient. Family History Cancer - Mother, Diabetes - Maternal Grandparents, Heart Disease - Paternal Grandparents, Hypertension - Paternal Grandparents, Seizures - Child, Stroke - Father, No family history of Hereditary Spherocytosis, Kidney Disease, Lung Disease, Thyroid Problems, Tuberculosis. Social History Never smoker, Marital Status - Divorced, Alcohol Use - Never, Drug Use - No History, Caffeine Use - Rarely. Medical History Eyes Patient has history of Cataracts - Surgery Hematologic/Lymphatic Patient has history of Anemia Respiratory Patient has history of Sleep Apnea Cardiovascular Patient has history of Arrhythmia, Congestive Heart Failure, Coronary Artery Disease, Hypertension Endocrine Patient has history of Type II Diabetes Musculoskeletal Patient has history of  Osteoarthritis Neurologic Patient has history of Neuropathy Oncologic Patient has history of Received Chemotherapy Denies history of Received Radiation Medical A Surgical History Notes nd Musculoskeletal Spinal Stenosis Oncologic Large B Cell Lymphoma Objective Constitutional respirations regular, non-labored and within target range for patient.. Vitals Time Taken: 1:56 PM, Height: 72 in, Weight: 235 lbs, BMI: 31.9, Temperature: 97.8 F, Pulse: 84 bpm, Respiratory Rate: 18 breaths/min, Blood Pressure: 131/71 mmHg, Capillary Blood Glucose: 127 mg/dl. Cardiovascular 2+ dorsalis pedis/posterior tibialis pulses. Psychiatric pleasant and cooperative.  General Notes: Right lower extremity: T the posterior aspect there is an open wound with granulation tissue and scant nonviable tissue present. No signs of o infection. Integumentary (Hair, Skin) Wound #2 status is Open. Original cause of wound was Trauma. The date acquired was: 12/24/2020. The wound has been in treatment 6 weeks. The wound is located on the Right,Posterior Lower Leg. The wound measures 1.8cm length x 2cm width x 0.1cm depth; 2.827cm^2 area and 0.283cm^3 volume. There is Fat Layer (Subcutaneous Tissue) exposed. There is no tunneling or undermining noted. There is a medium amount of serosanguineous drainage noted. The wound margin is distinct with the outline attached to the wound base. There is medium (34-66%) red, pink granulation within the wound bed. There is a medium (34-66%) amount of necrotic tissue within the wound bed including Adherent Slough. Assessment Active Problems ICD-10 Unspecified open wound, right lower leg, subsequent encounter Patient's wound continues to show improvement in size and appearance. No signs of infection on exam. I recommended continuing Santyl and Hydrofera Blue under compression therapy. I am hopeful that he will be closed up in 2 weeks. Plan Follow-up Appointments: Return Appointment  in 2 weeks. - Dr. Heber [place] 03/02/2021 Nurse Visit: - next week Bathing/ Shower/ Hygiene: May shower with protection but do not get wound dressing(s) wet. Edema Control - Lymphedema / SCD / Other: Elevate legs to the level of the heart or above for 30 minutes daily and/or when sitting, a frequency of: - throughout the day Avoid standing for long periods of time. Exercise regularly Moisturize legs daily. - left leg daily WOUND #2: - Lower Leg Wound Laterality: Right, Posterior Peri-Wound Care: Sween Lotion (Moisturizing lotion) 1 x Per Week/15 Days Discharge Instructions: Apply moisturizing lotion as directed Prim Dressing: Hydrofera Blue Ready Foam, 2.5 x2.5 in 1 x Per Week/15 Days ary Discharge Instructions: Apply to wound bed as instructed Prim Dressing: Santyl Ointment 1 x Per Week/15 Days ary Discharge Instructions: Apply nickel thick amount to wound bed as instructed Secondary Dressing: Woven Gauze Sponge, Non-Sterile 4x4 in 1 x Per Week/15 Days Discharge Instructions: Apply over primary dressing as directed. Com pression Wrap: Kerlix Roll 4.5x3.1 (in/yd) 1 x Per Week/15 Days Discharge Instructions: Apply Kerlix and Coban compression as directed. Com pression Wrap: Coban Self-Adherent Wrap 4x5 (in/yd) 1 x Per Week/15 Days Discharge Instructions: Apply over Kerlix as directed. 1. Santyl and Hydrofera Blue under Kerlix/Coban 2. Nurse visit in 1 week and 2 weeks with me Electronic Signature(s) Signed: 03/01/2021 3:00:24 PM By: Kalman Shan DO Entered By: Kalman Shan on 03/01/2021 14:59:53 -------------------------------------------------------------------------------- HxROS Details Patient Name: Date of Service: [name], [name] 03/01/2021 1:45 PM Medical Record Number: [id number] Patient Account Number: [id number] Date of Birth/Sex: Treating RN: 01-Aug-1940 (80 y.o. [name] Primary Care Provider: Roma Schanz Other Clinician: Referring Provider: Treating  Provider/Extender: Tharon Aquas in Treatment: 6 Information Obtained From Patient Eyes Medical History: Positive for: Cataracts - Surgery Hematologic/Lymphatic Medical History: Positive for: Anemia Respiratory Medical History: Positive for: Sleep Apnea Cardiovascular Medical History: Positive for: Arrhythmia; Congestive Heart Failure; Coronary Artery Disease; Hypertension Endocrine Medical History: Positive for: Type II Diabetes Time with diabetes: 20 years Treated with: Oral agents Blood sugar tested every day: Yes Tested : Q am Musculoskeletal Medical History: Positive for: Osteoarthritis Past Medical History Notes: Spinal Stenosis Neurologic Medical History: Positive for: Neuropathy Oncologic Medical History: Positive for: Received Chemotherapy Negative for: Received Radiation Past Medical History Notes: Large B Cell Lymphoma HBO Extended History  Items Eyes: Cataracts Immunizations Pneumococcal Vaccine: Received Pneumococcal Vaccination: Yes Received Pneumococcal Vaccination On or After 60th Birthday: No Implantable Devices Yes Family and Social History Cancer: Yes - Mother; Diabetes: Yes - Maternal Grandparents; Heart Disease: Yes - Paternal Grandparents; Hereditary Spherocytosis: No; Hypertension: Yes - Paternal Grandparents; Kidney Disease: No; Lung Disease: No; Seizures: Yes - Child; Stroke: Yes - Father; Thyroid Problems: No; Tuberculosis: No; Never smoker; Marital Status - Divorced; Alcohol Use: Never; Drug Use: No History; Caffeine Use: Rarely; Financial Concerns: No; Food, Clothing or Shelter Needs: No; Support System Lacking: No; Transportation Concerns: No Electronic Signature(s) Signed: 03/01/2021 3:00:24 PM By: Kalman Shan DO Signed: 03/01/2021 5:36:38 PM By: Deon Pilling Entered By: Kalman Shan on 03/01/2021 14:58:23 -------------------------------------------------------------------------------- SuperBill  Details Patient Name: Date of Service: [name], [name] 03/01/2021 Medical Record Number: [id number] Patient Account Number: [id number] Date of Birth/Sex: Treating RN: October 05, 1940 (80 y.o. [name] Primary Care Provider: Roma Schanz Other Clinician: Referring Provider: Treating Provider/Extender: Tharon Aquas in Treatment: 6 Diagnosis Coding ICD-10 Codes Code Description [phone] Unspecified open wound, right lower leg, subsequent encounter Facility Procedures CPT4 Code: [id number] Description: [place] [facility] [place] VISIT-LEV 3 EST PT Modifier: Quantity: 1 Physician Procedures : CPT4 Code Description Modifier [id number] - WC PHYS LEVEL 3 - EST PT ICD-10 Diagnosis Description S81.801D Unspecified open wound, right lower leg, subsequent encounter Quantity: 1 Electronic Signature(s) Signed: 03/01/2021 3:20:42 PM By: Kalman Shan DO Signed: 03/01/2021 4:23:50 PM By: Rhae Hammock RN Previous Signature: 03/01/2021 3:00:24 PM Version By: Kalman Shan DO Entered By: Rhae Hammock on 03/01/2021 15:04:10

## 2021-03-06 ENCOUNTER — Ambulatory Visit: Payer: Medicare Other | Admitting: Vascular Surgery

## 2021-03-07 NOTE — Progress Notes (Signed)
Electrophysiology Office Note Date: 03/08/2021  ID:  Richard [name], DOB 1940-11-27, MRN [id number]  PCP: [name], DO Primary Cardiologist: Minus Breeding, MD Electrophysiologist: [name], MD   CC: Pacemaker follow-up  Richard [name] is a 80 y.o. male seen today for [name], MD for routine electrophysiology followup.  Since last being seen in our clinic the patient reports doing OK.  Over the past several months he has developed worsening Left arm swelling concerning for significant subclavian stenosis.   [place] 10/20/2020 with no evidence of DVT.   CT 10/24/20 1. No evidence for lymphadenopathy in the chest. 2. Soft tissue stranding in the left axilla with small axillary lymph nodes. Imaging features suggest edema/inflammation. 3. Venous anatomy of the left axillary region is un opacified due to bolus timing and use of the right upper extremity for contrast administration. Patient had left upper extremity venous ultrasound on 10/20/2020. 4. Aortic Atherosclerosis (ICD10-I70.0).  Saw Dr. Valinda Hoar 12/21/20 who recommended EP follow up to discuss. Note says  "discussed with the patient the possibility of a central venogram and intervention however this most likely would be of limited durability due to the indwelling pacing wires and could potentially injure the conduction system of the pacing wires with the angioplasty.  The patient so far has fairly minimal symptoms from this and does not feel debilitated by this.  I believe the best option at this point would be to fit him for a left upper extremity compression garment to control swelling symptoms.  If his symptoms worsen over time and he feels this is more lifestyle limiting we could consider a central venogram.  However in light of his age any procedure would be of higher risk may be only of minimal durability.   If he develops any infections in his left upper extremity this should be treated very aggressively since  he has some lymphatic obstruction probably as well as venous outflow obstruction."  Underwent UE venogram 02/15/21 which showed "Severe (greater than 85% subclavian vein stenosis at the area of pacemaker wire entry.  No other evidence of venous stenosis.  The vein appears patent and peripheral and central to the stenosis."  He follows up today for EP planning. Dr. Caryl Comes has contacted Dr. Stanford Breed regarding plan as below. Pt is overall doing about the same. Oozing from Left UE with any trauma to the area. Denies undue SOB. No CP.    Device History: Medtronic Dual Chamber PPM implanted 08/2017 for CHB  Past Medical History:  Diagnosis Date   Anemia    Arthritis    hips   Axillary adenopathy 02/25/2017   Bradycardia    a. holter monitor has demonstrated HRs in 30s and [place]ach    CAD (coronary artery disease)    a. s/p CABG 2001  b.  07/28/2017 cath:   Severe three-vessel native CAD with total occlusion of LAD, ramus intermedius, first OM and RCA, patent RIMA to PDA, LIMA to LAD, sequential SVG to ramus intermedius and first OM.     Chronic lower back pain    Diffuse large B cell lymphoma (HCC)    Diverticulosis    Esophageal stricture    GERD (gastroesophageal reflux disease)    History of gout    HTN (hypertension)    Mixed hyperlipidemia    OSA on CPAP    with 2L O2 at night   Pancytopenia ([place])    a. related to chemo therapy for B cell lymphoma  Peptic stricture of esophagus    Presence of permanent cardiac pacemaker    sees Dr. Valaria Good pacemaker   SCC (squamous cell carcinoma) 01/31/2021   R zygoma, ED[facility]   SCC (squamous cell carcinoma) 01/31/2021   L post ankle, ED[facility]   SCC (squamous cell carcinoma) 01/31/2021   L popliteal, ED[facility]   Severe aortic stenosis    Spinal stenosis    Squamous cell carcinoma of skin 03/22/2009   Right mandible. SCCis, hypertrophic.    Type II diabetes mellitus ([place])    Wears dentures    partial upper   Past Surgical History:  Procedure  Laterality Date   APPENDECTOMY  ~ [place] Left 11/06/2016   Procedure: CATARACT EXTRACTION PHACO AND INTRAOCULAR LENS PLACEMENT (IOC);  Surgeon: Estill Cotta, MD;  Location: [facility];  Service: Ophthalmology;  Laterality: Left;  Lot # [id number] [place]: 01:09.4 AP%:25.2 CDE: 30.64   CATARACT EXTRACTION W/PHACO Right 12/04/2016   Procedure: CATARACT EXTRACTION PHACO AND INTRAOCULAR LENS PLACEMENT (IOC);  Surgeon: Estill Cotta, MD;  Location: [facility];  Service: Ophthalmology;  Laterality: Right;  [place] 1:25.9 AP% 24.1 CDE 39.10 Fluid Pack lot # [id number]   COLONOSCOPY W/ BIOPSIES AND POLYPECTOMY  2013   CORONARY ANGIOPLASTY  1993   CORONARY ANGIOPLASTY WITH STENT PLACEMENT  05/1997   "1"   CORONARY ARTERY BYPASS GRAFT  03/2000   "CABG X5"   ECTROPION REPAIR Right 09/01/2018   Procedure: REPAIR OF ECTROPION BILATERAL upper and lower;  Surgeon: Karle Starch, MD;  Location: [place];  Service: Ophthalmology;  Laterality: Right;  Diabetic - oral meds sleep apnea   ESOPHAGEAL DILATION  X 3-4   Dr. Lyla Son; "last one was in the 1990's"   ESOPHAGOGASTRODUODENOSCOPY     multiple   FLEXIBLE SIGMOIDOSCOPY     multiple   HYDRADENITIS EXCISION Left 02/25/2017   Procedure: EXCISION DEEP LEFT AXILLARY LYMPH NODE;  Surgeon: Fanny Skates, MD;  Location: [place];  Service: General;  Laterality: Left;   INTRAOPERATIVE TRANSTHORACIC ECHOCARDIOGRAM N/A 09/09/2017   Procedure: INTRAOPERATIVE TRANSTHORACIC ECHOCARDIOGRAM;  Surgeon: Burnell Blanks, MD;  Location: [place];  Service: Open Heart Surgery;  Laterality: N/A;   KNEE ARTHROSCOPY Left 2011   meniscus repair   LEFT HEART CATHETERIZATION WITH CORONARY/GRAFT ANGIOGRAM N/A 03/16/2014   Procedure: LEFT HEART CATHETERIZATION WITH Beatrix Fetters;  Surgeon: Burnell Blanks, MD;  Location: [facility];  Service: Cardiovascular;  Laterality: N/A;   LUMBAR LAMINECTOMY/DECOMPRESSION  MICRODISCECTOMY Right 06/17/2013   Procedure: LUMBAR LAMINECTOMY MICRODISCECTOMY L4-L5 RIGHT EXCISION OF SYNOVIAL CYST RIGHT   (1 LEVEL) RIGHT PARTIAL FACETECTOMY;  Surgeon: Tobi Bastos, MD;  Location: [facility];  Service: Orthopedics;  Laterality: Right;   MYELOGRAM  04/06/13   lumbar, Dr Gladstone Lighter   ORBITAL LESION EXCISION Right 09/01/2018   Procedure: ORBITOTOMY WITHOUT BONE FLAP WITH REMOVAL OF LESION RIGHT;  Surgeon: Karle Starch, MD;  Location: [place];  Service: Ophthalmology;  Laterality: Right;   PACEMAKER IMPLANT N/A 09/10/2017   Procedure: PACEMAKER IMPLANT;  Surgeon: Deboraha Sprang, MD;  Location: [place] [facility];  Service: Cardiovascular;  Laterality: N/A;   PANENDOSCOPY     PORTACATH PLACEMENT N/A 03/06/2017   Procedure: INSERTION PORT-A-CATH AND ASPIRATE SEROMA LEFT AXILLA;  Surgeon: Fanny Skates, MD;  Location: [place];  Service: General;  Laterality: N/A;   RIGHT/LEFT HEART CATH AND CORONARY/GRAFT ANGIOGRAPHY N/A 07/28/2017   Procedure: RIGHT/LEFT HEART CATH  AND CORONARY/GRAFT ANGIOGRAPHY;  Surgeon: Sherren Mocha, MD;  Location: [place] [facility];  Service: Cardiovascular;  Laterality: N/A;   SHOULDER SURGERY Right 08/2010   screws placed; "tendons tore off"   SKIN CANCER EXCISION Right    "neck"   TONSILLECTOMY  ~ [place] Left 06/29/2019   Procedure: TOTAL KNEE ARTHROPLASTY;  Surgeon: Paralee Cancel, MD;  Location: [facility];  Service: Orthopedics;  Laterality: Left;  70 mins   TRANSCATHETER AORTIC VALVE REPLACEMENT, TRANSFEMORAL N/A 09/09/2017   Procedure: TRANSCATHETER AORTIC VALVE REPLACEMENT, TRANSFEMORAL;  Surgeon: Burnell Blanks, MD;  Location: [place];  Service: Open Heart Surgery;  Laterality: N/A;   UPPER EXTREMITY VENOGRAPHY Left 02/15/2021   Procedure: CENTRAL VENO;  Surgeon: [name], MD;  Location: [place] [facility];  Service: Cardiovascular;  Laterality: Left;   UPPER GI ENDOSCOPY  2013   Gastritis; Dr Elenore Paddy      Current Outpatient Medications  Medication Sig Dispense Refill   acetaminophen (TYLENOL) 325 MG tablet Take 650 mg by mouth at bedtime as needed for moderate pain or headache.     allopurinol (ZYLOPRIM) 300 MG tablet Take 450 mg by mouth daily.     aluminum hydroxide-magnesium carbonate (GAVISCON) 95-358 MG/15ML SUSP Take 15 mLs by mouth as needed for indigestion or heartburn.     amLODipine (NORVASC) 5 MG tablet TAKE 1 TABLET BY MOUTH TWICE A DAY 180 tablet 3   apixaban (ELIQUIS) 5 MG TABS tablet Take 1 tablet (5 mg total) by mouth 2 (two) times daily. 180 tablet 1   atorvastatin (LIPITOR) 80 MG tablet TAKE ONE TABLET BY MOUTH AT BEDTIME 90 tablet 3   azelastine (ASTELIN) 0.1 % nasal spray Place 2 sprays into both nostrils at bedtime as needed for rhinitis or allergies. 30 mL 12   dapagliflozin propanediol (FARXIGA) 10 MG TABS tablet Take 10 mg by mouth daily.     esomeprazole (NEXIUM) 40 MG capsule Take 1 capsule (40 mg total) by mouth daily. 30 capsule 5   ezetimibe (ZETIA) 10 MG tablet TAKE 1 TABLET BY MOUTH EVERY DAY 90 tablet 3   fenofibrate 160 MG tablet TAKE 1 TABLET BY MOUTH EVERY DAY 90 tablet 0   folic acid (FOLVITE) 1 MG tablet TAKE 2 TABLETS BY MOUTH EVERY DAY 180 tablet 4   FREESTYLE LITE test strip USE TO TEST BLOOD SUGAR ONCE A DAY. DX CODE: E11.9 100 strip 12   glimepiride (AMARYL) 1 MG tablet TAKE 1 TABLET BY MOUTH DAILY WITH BREAKFAST. 90 tablet 1   Lancets (FREESTYLE) lancets USE TWICE A DAY TO CHECK BLOOD SUGAR. DX E11.9 100 each 6   metFORMIN (GLUCOPHAGE) 1000 MG tablet TAKE 1 & 1/2 TABLETS BY MOUTH EVERY MORNING AND 1 TABLET IN THE EVENING. 225 tablet 1   metoprolol tartrate (LOPRESSOR) 25 MG tablet TAKE ONE-HALF TABLET BY MOUTH TWICE DAILY 90 tablet 3   nitroGLYCERIN (NITROSTAT) 0.4 MG SL tablet PLACE 1 TABLET (0.4 MG TOTAL) UNDER THE TONGUE EVERY 5 (FIVE) MINUTES AS NEEDED FOR CHEST PAIN. 25 tablet 6   potassium chloride SA (KLOR-CON M20) 20 MEQ tablet  Take 2 tablets (40 mEq total) by mouth 2 (two) times daily. 360 tablet 3   pregabalin (LYRICA) 150 MG capsule TAKE 1 CAPSULE BY MOUTH TWICE A DAY 180 capsule 0   psyllium (METAMUCIL) 58.6 % powder Take 1 packet by mouth daily as needed (constipation).     torsemide (DEMADEX) 20 MG  tablet Take 20 mg by mouth daily.     VASCEPA 1 g capsule TAKE 2 CAPSULES BY MOUTH TWICE A DAY 120 capsule 5   No current facility-administered medications for this visit.   Facility-Administered Medications Ordered in Other Visits  Medication Dose Route Frequency Provider Last Rate Last Admin   sodium chloride flush (NS) 0.9 % injection 10 mL  10 mL Intravenous PRN [name], MD   10 mL at 05/30/17 0920    Allergies:   Benazepril, Hctz [hydrochlorothiazide], Aspirin, and Lactose intolerance (gi)   Social History: Social History   Socioeconomic History   Marital status: Divorced    Spouse name: Not on file   Number of children: 2   Years of education: college   Highest education level: Not on file  Occupational History    Employer: RETIRED  Tobacco Use   Smoking status: Never   Smokeless tobacco: Never  Vaping Use   Vaping Use: Never used  Substance and Sexual Activity   Alcohol use: No    Alcohol/week: 0.0 standard drinks    Comment: "last drink was in 2012"( 03/15/2014)   Drug use: No   Sexual activity: Not Currently  Other Topics Concern   Not on file  Social History Narrative   ** Merged History Encounter **       Divorced, lives with a roommate. 1 son one daughter 3-4 caffeinated beverages daily Right-handed. 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   Social Determinants of Health   Financial Resource Strain: Low Risk    Difficulty of Paying Living Expenses: Not hard at all  Food Insecurity: Not on file  Transportation Needs: Not on file  Physical  Activity: Not on file  Stress: Not on file  Social Connections: Not on file  Intimate Partner Violence: Not on file    Family History: Family History  Problem Relation Age of Onset   Stroke Father    Hypertension Father    Pancreatic cancer Mother    Diabetes Maternal Grandmother    Stroke Maternal Grandmother    Heart attack Paternal Grandmother    Colon cancer Neg Hx    Esophageal cancer Neg Hx    Rectal cancer Neg Hx    Stomach cancer Neg Hx    Ulcers Neg Hx      Review of Systems: Review of systems complete and found to be negative unless listed in HPI.    Physical Exam: Vitals:   03/08/21 1039  BP: 118/70  Pulse: 73  SpO2: 95%  Weight: 244 lb (110.7 kg)  Height: 6' (1.829 m)     General:  Well appearing. No resp difficulty. HEENT: Normal Neck: Supple. JVP 5-6. Carotids 2+ bilat; no bruits. No thyromegaly or nodule noted. Cor: PMI nondisplaced. RRR, No M/G/R noted Lungs: CTAB, normal effort. Abdomen: Soft, non-tender, non-distended, no HSM. No bruits or masses. +BS   Extremities: No cyanosis, clubbing, or rash. R and LLE no edema. LUE edematous Neuro: Alert & orientedx3, cranial nerves grossly intact. moves all 4 extremities w/o difficulty. Affect pleasant   PPM Interrogation- Not checked today. At last visit. Did have intrinsic rhythm, CHB in 30s  EKG:  EKG is not ordered today.  Recent Labs: 02/19/2021: ALT 25; BUN 31; Creatinine 1.47; Hemoglobin 12.5; Platelet Count 178; Potassium 4.2; Sodium 141   Wt Readings from Last 3 Encounters:  03/08/21 244 lb (110.7 kg)  02/19/21 243 lb (110.2 kg)  02/15/21 236 lb (107 kg)     Other studies Reviewed: Additional studies/ records that were reviewed today include: Previous EP office notes, Previous remote checks, Most recent labwork.   Assessment and Plan:  Aortic stenosis status post TAVR  Stable and asymptomatic.    Followed by gen cards.    Atrial Fibrillation persistent Burden <1% currently by most  recent device interrogation.  Continue eliquis for CHA2DS2-VASc of at least 6.  He continues to fluctuate around the 1.5 Cr limit of dose adjustment. Cr 02/19/21 1.47  Intermittent CHB s/p Pacemaker -Medtronic His bundle  Stable interrogation at last visit sevearl weeks ago. See PaceART He did not tolerate down-titration of his stable thresholds in the past. ? Less selective HIS capture   CAD s/p CABG  No s/s ischemia  Follows with Dr. Percival Spanish and lipid clinic.    OSA on CPAP   Non Hodgkins Lymphoma Diffuse  chemos Cx 2019  Completed chemotherapy, still has port. Management per hem/onc . Stable.   Anemia Pernicious  Hgb 12.5 02/19/21   Leg Discomfort He reports his leg discomfort is consistent with chronic neuropathy  Symptoms stable.    Left UE edema / likely subclavian stenosis/occlusion Venogram 02/15/21 with "severe" 85% subclavian vein stenosis at the area of pacemaker wire entry.  No other evidence of venous stenosis.  The vein appears patent and peripheral and central to the stenosis.  Discussed at length with Dr. Caryl Comes who has reached out to Dr. Oneida Alar (follows patient) and Dr. Stanford Breed (performed Venogram).   EP has recommended Angioplasty, ideally with balloon only if possible. Stenting would likely trap the lead in the event it eventually needs to be extracted.  Our concern with device extraction would be that further injury to the area could potentially cause total occlusion.  Malfunction of the pacemaker is a possibility with any internal manipulation, including micro or macro dislodgement of the lead that could lead to failure to pace or increased thresholds.  Dr. Caryl Comes to further discuss with Dr. Quentin Ore regarding alternate pacing strategies if needed in the future.   Current medicines are reviewed at length with the patient today.   The patient does not have concerns regarding his medicines.  The following changes were made today:  none  Labs/ tests ordered today  include:  No orders of the defined types were placed in this encounter.  Disposition:   Keep 2 month follow up with Dr. Caryl Comes in place for now. He sees Dr. Stanford Breed next week for further planning.  Jacalyn Lefevre, PA-C  03/08/2021 11:07 AM  [facility] [address] [place]  [address] [phone] (office) [phone] (fax)

## 2021-03-08 ENCOUNTER — Other Ambulatory Visit: Payer: Self-pay

## 2021-03-08 ENCOUNTER — Encounter: Payer: Self-pay | Admitting: Student

## 2021-03-08 ENCOUNTER — Ambulatory Visit (INDEPENDENT_AMBULATORY_CARE_PROVIDER_SITE_OTHER): Payer: Medicare Other | Admitting: Student

## 2021-03-08 ENCOUNTER — Encounter (HOSPITAL_BASED_OUTPATIENT_CLINIC_OR_DEPARTMENT_OTHER): Payer: Medicare Other | Admitting: Internal Medicine

## 2021-03-08 VITALS — BP 118/70 | HR 73 | Ht 72.0 in | Wt 244.0 lb

## 2021-03-08 DIAGNOSIS — S81801A Unspecified open wound, right lower leg, initial encounter: Secondary | ICD-10-CM | POA: Diagnosis not present

## 2021-03-08 DIAGNOSIS — I442 Atrioventricular block, complete: Secondary | ICD-10-CM

## 2021-03-08 DIAGNOSIS — I25810 Atherosclerosis of coronary artery bypass graft(s) without angina pectoris: Secondary | ICD-10-CM

## 2021-03-08 DIAGNOSIS — Z09 Encounter for follow-up examination after completed treatment for conditions other than malignant neoplasm: Secondary | ICD-10-CM | POA: Diagnosis not present

## 2021-03-08 DIAGNOSIS — Z951 Presence of aortocoronary bypass graft: Secondary | ICD-10-CM

## 2021-03-08 DIAGNOSIS — I480 Paroxysmal atrial fibrillation: Secondary | ICD-10-CM | POA: Diagnosis not present

## 2021-03-08 NOTE — Progress Notes (Signed)
Richard [name] ([id number]) Visit Report for 03/08/2021 Arrival Information Details Patient Name: Date of Service: Richard [name] [facility] L. 03/08/2021 8:45 A M Medical Record Number: [id number] Patient Account Number: [id number] Date of Birth/Sex: Treating RN: Jul 12, 1940 (79 y.o. Richard [name], Richard [name] Primary Care Richard [name]: Richard [name] Other Clinician: Referring Richard [name]: Treating Richard [name]/Extender: Richard [name] in Treatment: 7 Visit Information History Since Last Visit Added or deleted any medications: No Patient Arrived: Ambulatory Any new allergies or adverse reactions: No Arrival Time: 08:12 Had a fall or experienced change in No Accompanied By: self activities of daily living that may affect Transfer Assistance: None risk of falls: Patient Identification Verified: Yes Signs or symptoms of abuse/neglect since last visito No Secondary Verification Process Completed: Yes Hospitalized since last visit: No Patient Requires Transmission-Based Precautions: No Implantable device outside of the clinic excluding No Patient Has Alerts: Yes cellular tissue based products placed in the center Patient Alerts: Patient on Blood Thinner since last visit: Has Dressing in Place as Prescribed: Yes Pain Present Now: No Electronic Signature(s) Signed: 03/08/2021 6:08:32 PM By: Richard Hammock RN Entered By: Richard [name] on 03/08/2021 08:12:30 -------------------------------------------------------------------------------- Clinic Level of Care Assessment Details Patient Name: Date of Service: Richard [name] [name] 03/08/2021 8:45 A M Medical Record Number: [id number] Patient Account Number: [id number] Date of Birth/Sex: Treating RN: 11-15-1940 (80 y.o. Janyth [name] Primary Care Richard [name]: Richard [name] Other Clinician: Referring Aryah Doering: Treating Richard [name]/Extender: Richard [name] in Treatment: 7 Clinic Level of Care  Assessment Items TOOL 4 Quantity Score X- 1 0 Use when only an EandM is performed on FOLLOW-UP visit ASSESSMENTS - Nursing Assessment / Reassessment X- 1 10 Reassessment of Co-morbidities (includes updates in patient status) X- 1 5 Reassessment of Adherence to Treatment Plan ASSESSMENTS - Wound and Skin A ssessment / Reassessment X - Simple Wound Assessment / Reassessment - one wound 1 5 _0  - 0 Complex Wound Assessment / Reassessment - multiple wounds _1  - 0 Dermatologic / Skin Assessment (not related to wound area) ASSESSMENTS - Focused Assessment _2  - 0 Circumferential Edema Measurements - multi extremities _3  - 0 Nutritional Assessment / Counseling / Intervention _4  - 0 Lower Extremity Assessment (monofilament, tuning fork, pulses) _5  - 0 Peripheral Arterial Disease Assessment (using hand held doppler) ASSESSMENTS - Ostomy and/or Continence Assessment and Care _6  - 0 Incontinence Assessment and Management _7  - 0 Ostomy Care Assessment and Management (repouching, etc.) PROCESS - Coordination of Care X - Simple Patient / Family Education for ongoing care 1 15 _8  - 0 Complex (extensive) Patient / Family Education for ongoing care X- 1 10 Staff obtains Programmer, systems, Records, T Results / Process Orders est _9  - 0 Staff telephones HHA, Nursing Homes / Clarify orders / etc _10  - 0 Routine Transfer to another Facility (non-emergent condition) _11  - 0 Routine Hospital Admission (non-emergent condition) _12  - 0 New Admissions / Biomedical engineer / Ordering NPWT Apligraf, etc. , _13  - 0 Emergency Hospital Admission (emergent condition) _14  - 0 Simple Discharge Coordination _15  - 0 Complex (extensive) Discharge Coordination PROCESS - Special Needs _16  - 0 Pediatric / Minor Patient Management _17  - 0 Isolation Patient Management _18  - 0 Hearing / Language / Visual special needs _19  - 0 Assessment of Community assistance (transportation, D/C planning, etc.) _20  -  0 Additional assistance / Altered mentation _21  - 0 Support Surface(s) Assessment (bed, cushion, seat, etc.) INTERVENTIONS - Wound Cleansing / Measurement X - Simple Wound Cleansing - one wound 1 5 _22  -  0 Complex Wound Cleansing - multiple wounds X- 1 5 Wound Imaging (photographs - any number of wounds) _0  - 0 Wound Tracing (instead of photographs) X- 1 5 Simple Wound Measurement - one wound _1  - 0 Complex Wound Measurement - multiple wounds INTERVENTIONS - Wound Dressings _2  - 0 Small Wound Dressing one or multiple wounds _3  - 0 Medium Wound Dressing one or multiple wounds X- 1 20 Large Wound Dressing one or multiple wounds <YWVPXTGGYIRSWNIO>_2<\/VOJJKKXFGHWEXHBZ>_1  - 0 Application of Medications - topical <IRCVELFYBOFBPZWC>_5<\/ENIDPOEUMPNTIRWE>_3  - 0 Application of Medications - injection INTERVENTIONS - Miscellaneous _6  - 0 External ear exam _7  - 0 Specimen Collection (cultures, biopsies, blood, body fluids, etc.) _8  - 0 Specimen(s) / Culture(s) sent or taken to Lab for analysis _9  - 0 Patient Transfer (multiple staff / Civil Service fast streamer / Similar devices) _10  - 0 Simple Staple / Suture removal (25 or less) _11  - 0 Complex Staple / Suture removal (26 or more) _12  - 0 Hypo / Hyperglycemic Management (close monitor of Blood Glucose) _13  - 0 Ankle / Brachial Index (ABI) - do not check if billed separately X- 1 5 Vital Signs Has the patient been seen at the hospital within the last three years: Yes Total Score: 85 Level Of Care: New/Established - Level 3 Electronic Signature(s) Signed: 03/08/2021 6:29:12 PM By: Levan Hurst RN, BSN Entered By: Levan Hurst on 03/08/2021 11:27:39 -------------------------------------------------------------------------------- Lower Extremity Assessment Details Patient Name: Date of Service: Richard [name], [place] L. 03/08/2021 8:45 A M Medical Record Number: [id number] Patient Account Number: [id number] Date of Birth/Sex: Treating RN: October 28, 1940 (80 y.o. Richard [name], Richard [name] Primary Care Lillyian Heidt: Richard [name] Other  Clinician: Referring Ladavion Savitz: Treating Jarick Harkins/Extender: Richard [name] in Treatment: 7 Edema Assessment Assessed: Shirlyn Goltz: No] Patrice Paradise: Yes] Edema: [Left: Ye] [Right: s] Calf Left: Right: Point of Measurement: 30 cm From Medial Instep 33 cm Ankle Left: Right: Point of Measurement: 9 cm From Medial Instep 23 cm Vascular Assessment Pulses: Dorsalis Pedis Palpable: [Right:Yes] Posterior Tibial Palpable: [Right:Yes] Electronic Signature(s) Signed: 03/08/2021 6:08:32 PM By: Richard Hammock RN Entered By: Richard [name] on 03/08/2021 08:12:48 -------------------------------------------------------------------------------- Multi-Disciplinary Care Plan Details Patient Name: Date of Service: Richard [name], [place]. 03/08/2021 8:45 A M Medical Record Number: [id number] Patient Account Number: [id number] Date of Birth/Sex: Treating RN: 03-28-1941 (80 y.o. Janyth [name] Primary Care Priyal Musquiz: Richard [name] Other Clinician: Referring Skylen Danielsen: Treating Takelia Urieta/Extender: Richard [name] in Treatment: 7 [place] reviewed with physician Active Inactive Wound/Skin Impairment Nursing Diagnoses: Impaired tissue integrity Knowledge deficit related to ulceration/compromised skin integrity Goals: Patient/caregiver will verbalize understanding of skin care regimen Date Initiated: 01/12/2021 Target Resolution Date: 03/29/2021 Goal Status: Active Ulcer/skin breakdown will have a volume reduction of 30% by week 4 Date Initiated: 01/12/2021 Date Inactivated: 02/16/2021 Target Resolution Date: 02/09/2021 Goal Status: Met Ulcer/skin breakdown will have a volume reduction of 80% by week 12 Date Initiated: 02/16/2021 Date Inactivated: 03/01/2021 Target Resolution Date: 03/16/2021 Goal Status: Met Ulcer/skin breakdown will heal within 14 weeks Date Initiated: 03/01/2021 Target Resolution Date: 04/05/2021 Goal Status:  Active Interventions: Assess patient/caregiver ability to obtain necessary supplies Assess patient/caregiver ability to perform ulcer/skin care regimen upon admission and as needed Assess ulceration(s) every visit Provide education on ulcer and skin care Treatment Activities: Skin care regimen initiated : 01/12/2021 Topical wound management initiated : 01/12/2021 Notes: Electronic Signature(s) Signed: 03/08/2021 6:29:12 PM By: Levan Hurst RN, BSN Entered By: Levan Hurst on 03/08/2021 08:22:33 -------------------------------------------------------------------------------- Pain Assessment Details Patient Name: Date of Service:  Richard [name], Richard [name] 03/08/2021 8:45 A M Medical Record Number: [id number] Patient Account Number: [id number] Date of Birth/Sex: Treating RN: 14-Dec-1940 (80 y.o. Richard [name], Richard [name] Primary Care Shirelle Tootle: Richard [name] Other Clinician: Referring Jaquasha Carne[place]: Treating Mateja Dier/Extender: Richard [name] in Treatment: 7 Active Problems Location of Pain Severity and Description of Pain Patient Has Paino No Site Locations Pain Management and Medication Current Pain Management: Electronic Signature(s) Signed: 03/08/2021 6:08:32 PM By: Richard Hammock RN Entered By: Richard [name] on 03/08/2021 08:12:38 -------------------------------------------------------------------------------- Patient/Caregiver Education Details Patient Name: Date of Service: Richard [name], Richard [name] 9/8/2022andnbsp8:45 A M Medical Record Number: [id number] Patient Account Number: [id number] Date of Birth/Gender: Treating RN: 1940-12-31 (80 y.o. Janyth [name] Primary Care Physician: Richard [name] Other Clinician: Referring Physician: Treating Physician/Extender: Richard [name] in Treatment: 7 Education Assessment Education Provided To: Patient Education Topics Provided Wound/Skin Impairment: Methods:  Explain/Verbal Responses: State content correctly Electronic Signature(s) Signed: 03/08/2021 6:29:12 PM By: Levan Hurst RN, BSN Entered By: Levan Hurst on 03/08/2021 08:22:43 -------------------------------------------------------------------------------- Wound Assessment Details Patient Name: Date of Service: Richard [name] [name] 03/08/2021 8:45 A M Medical Record Number: [id number] Patient Account Number: [id number] Date of Birth/Sex: Treating RN: 08/06/1940 (80 y.o. Richard [name], Richard [name] Primary Care Jacquis Paxton: Richard [name] Other Clinician: Referring Maghan Jessee: Treating Nikalas Bramel/Extender: Richard [name] in Treatment: 7 Wound Status Wound Number: 2 Primary Trauma, Other Etiology: Wound Location: Right, Posterior Lower Leg Wound Open Wounding Event: Trauma Status: Date Acquired: 12/24/2020 Comorbid Cataracts, Anemia, Sleep Apnea, Arrhythmia, Congestive Heart Weeks Of Treatment: 7 History: Failure, Coronary Artery Disease, Hypertension, Type II Diabetes, Clustered Wound: No Osteoarthritis, Neuropathy, Received Chemotherapy Photos Wound Measurements Length: (cm) 1.8 Width: (cm) 1.6 Depth: (cm) 0.1 Area: (cm) 2.262 Volume: (cm) 0.226 % Reduction in Area: 83.5% % Reduction in Volume: 91.8% Epithelialization: Medium (34-66%) Tunneling: No Undermining: No Wound Description Classification: Full Thickness Without Exposed Support Structures Wound Margin: Distinct, outline attached Exudate Amount: Medium Exudate Type: Serosanguineous Exudate Color: red, brown Foul Odor After Cleansing: No Slough/Fibrino Yes Wound Bed Granulation Amount: Medium (34-66%) Exposed Structure Granulation Quality: Red, Pink Fascia Exposed: No Necrotic Amount: Medium (34-66%) Fat Layer (Subcutaneous Tissue) Exposed: Yes Necrotic Quality: Adherent Slough Tendon Exposed: No Muscle Exposed: No Joint Exposed: No Bone Exposed: No Treatment Notes Wound #2 (Lower  Leg) Wound Laterality: Right, Posterior Cleanser Peri-Wound Care Sween Lotion (Moisturizing lotion) Discharge Instruction: Apply moisturizing lotion as directed Topical Primary Dressing Hydrofera Blue Ready Foam, 2.5 x2.5 in Discharge Instruction: Apply to wound bed as instructed Santyl Ointment Discharge Instruction: Apply nickel thick amount to wound bed as instructed Secondary Dressing Woven Gauze Sponge, Non-Sterile 4x4 in Discharge Instruction: Apply over primary dressing as directed. Secured With Compression Wrap Kerlix Roll 4.5x3.1 (in/yd) Discharge Instruction: Apply Kerlix and Coban compression as directed. Coban Self-Adherent Wrap 4x5 (in/yd) Discharge Instruction: Apply over Kerlix as directed. Compression Stockings Add-Ons Electronic Signature(s) Signed: 03/08/2021 6:08:32 PM By: Richard Hammock RN Entered By: Richard [name] on 03/08/2021 08:16:11 -------------------------------------------------------------------------------- Vitals Details Patient Name: Date of Service: Richard [name], Richard [name] [name] 03/08/2021 8:45 A M Medical Record Number: [id number] Patient Account Number: [id number] Date of Birth/Sex: Treating RN: 1940/07/25 (80 y.o. Richard [name], Richard [name] Primary Care Kyrstin Campillo: Richard [name] Other Clinician: Referring Maurianna Benard: Treating Javaria Knapke/Extender: Richard [name] in Treatment: 7 Vital Signs Time Taken: 08:12 Temperature (F): 98.2 Height (in): 72 Pulse (bpm): 80 Weight (lbs): 235 Respiratory Rate (breaths/min): 17 Body Mass Index (BMI): 31.9 Blood Pressure (mmHg): 124/69 Capillary  Blood Glucose (mg/dl): 122 Reference Range: 80 - 120 mg / dl Electronic Signature(s) Signed: 03/08/2021 6:08:32 PM By: Richard Hammock RN Entered By: Richard [name] on 03/08/2021 08:13:18

## 2021-03-08 NOTE — Progress Notes (Signed)
IDA, UPPAL ([id number]) Visit Report for 03/08/2021 Problem List Details Patient Name: Date of Service: Richard [name] [name] 03/08/2021 8:45 A M Medical Record Number: [id number] Patient Account Number: [id number] Date of Birth/Sex: Treating RN: 06/16/1941 (80 y.o. Richard [name], [name] Primary Care Provider: Roma Schanz Other Clinician: Referring Provider: Treating Provider/Extender: Tharon Aquas in Treatment: 7 Active Problems ICD-10 Encounter Code Description Active Date MDM Diagnosis S81.801D Unspecified open wound, right lower leg, subsequent encounter 01/19/2021 No Yes Inactive Problems ICD-10 Code Description Active Date Inactive Date S40.812A Abrasion of left upper arm, initial encounter 01/12/2021 01/12/2021 S81.801A Unspecified open wound, right lower leg, initial encounter 01/12/2021 01/12/2021 A21.308M Unspecified open wound, left lower leg, initial encounter 01/12/2021 01/12/2021 S81.802D Unspecified open wound, left lower leg, subsequent encounter 01/19/2021 01/19/2021 Resolved Problems ICD-10 Code Description Active Date Resolved Date S40.812D Abrasion of left upper arm, subsequent encounter 01/19/2021 01/19/2021 Electronic Signature(s) Signed: 03/08/2021 10:48:03 AM By: Kalman Shan DO Signed: 03/08/2021 6:08:32 PM By: Rhae Hammock RN Entered By: Rhae Hammock on 03/08/2021 08:18:16 -------------------------------------------------------------------------------- [place] Details Patient Name: Date of Service: Richard [name], [place]. 03/08/2021 Medical Record Number: [id number] Patient Account Number: [id number] Date of Birth/Sex: Treating RN: August 09, 1940 (80 y.o. Richard [name] Primary Care Provider: Roma Schanz Other Clinician: Referring Provider: Treating Provider/Extender: Tharon Aquas in Treatment: 7 Diagnosis Coding ICD-10 Codes Code Description S81.801D Unspecified open wound, right  lower leg, subsequent encounter Facility Procedures CPT4 Code: [id number] Description: [place] VISIT-LEV 3 EST PT Modifier: Quantity: 1 Electronic Signature(s) Signed: 03/08/2021 11:52:03 AM By: Kalman Shan DO Signed: 03/08/2021 6:29:12 PM By: Levan Hurst RN, BSN Entered By: Levan Hurst on 03/08/2021 11:27:59

## 2021-03-13 ENCOUNTER — Other Ambulatory Visit: Payer: Self-pay

## 2021-03-13 ENCOUNTER — Ambulatory Visit (INDEPENDENT_AMBULATORY_CARE_PROVIDER_SITE_OTHER): Payer: Medicare Other | Admitting: Vascular Surgery

## 2021-03-13 ENCOUNTER — Encounter: Payer: Self-pay | Admitting: Vascular Surgery

## 2021-03-13 VITALS — BP 117/62 | HR 75 | Temp 98.7°F | Resp 20 | Ht 72.0 in | Wt 247.0 lb

## 2021-03-13 DIAGNOSIS — I871 Compression of vein: Secondary | ICD-10-CM

## 2021-03-13 NOTE — H&P (View-Only) (Signed)
Referring Physician: Dr. Rushie Nyhan  Patient name: Richard [name] MRN: [id number] DOB: May 19, 1941 Sex: male  REASON FOR CONSULT: Left arm swelling  HPI: Richard [name] is a 80 y.o. male, with a 5 to 35-month history of increased left arm swelling.  His left arm is about 50% larger than his right arm.  He recently had an ulceration on his left hand and had some clear drainage from this.  He states the hand does have less swelling by the morning time if he elevates it while he is sleeping.  He has no prior history of DVT.  He does have a left-sided pacemaker with leads through the left subclavian vein.  He has also had axillary lymph nodes removed from the left side.  He has a right side Port-A-Cath.  The swelling is minimally bothersome to him.  He is still able to play golf and do his daily activities as he wishes.    02/13/21: Patient reports persistent left upper extremity swelling.  He saw his electrophysiologist who recommended central venogram before considering receding of pacemaker which seems to be working quite well for him.  03/13/21: Returns to discuss options. EP reviewed images and recommended venoplasty.  No change in left upper extremity symptoms.   Past Medical History:  Diagnosis Date   Anemia    Arthritis    hips   Axillary adenopathy 02/25/2017   Bradycardia    a. holter monitor has demonstrated HRs in 30s and [place]    CAD (coronary artery disease)    a. s/p CABG 2001  b.  07/28/2017 cath:   Severe three-vessel native CAD with total occlusion of LAD, ramus intermedius, first OM and RCA, patent RIMA to PDA, LIMA to LAD, sequential SVG to ramus intermedius and first OM.     Chronic lower back pain    Diffuse large B cell lymphoma (HCC)    Diverticulosis    Esophageal stricture    GERD (gastroesophageal reflux disease)    History of gout    HTN (hypertension)    Mixed hyperlipidemia    OSA on CPAP    with 2L O2 at night   Pancytopenia ([place])    a. related to chemo  therapy for B cell lymphoma   Peptic stricture of esophagus    Presence of permanent cardiac pacemaker    sees Dr. Valaria Good pacemaker   SCC (squamous cell carcinoma) 01/31/2021   R zygoma, [facility]   SCC (squamous cell carcinoma) 01/31/2021   L post ankle, [facility]   SCC (squamous cell carcinoma) 01/31/2021   L popliteal, [facility]   Severe aortic stenosis    Spinal stenosis    Squamous cell carcinoma of skin 03/22/2009   Right mandible. SCCis, hypertrophic.    Type II diabetes mellitus ([place])    Wears dentures    partial upper   Past Surgical History:  Procedure Laterality Date   APPENDECTOMY  ~ [place] Left 11/06/2016   Procedure: CATARACT EXTRACTION PHACO AND INTRAOCULAR LENS PLACEMENT (IOC);  Surgeon: Estill Cotta, MD;  Location: [facility];  Service: Ophthalmology;  Laterality: Left;  Lot # [id number] H [place]: 01:09.4 AP%:25.2 CDE: 30.64   CATARACT EXTRACTION W/PHACO Right 12/04/2016   Procedure: CATARACT EXTRACTION PHACO AND INTRAOCULAR LENS PLACEMENT (IOC);  Surgeon: Estill Cotta, MD;  Location: [facility];  Service: Ophthalmology;  Laterality: Right;  [place] 1:25.9 AP% 24.1 CDE 39.10 Fluid Pack lot # [id number]   COLONOSCOPY W/  BIOPSIES AND POLYPECTOMY  2013   CORONARY ANGIOPLASTY  1993   CORONARY ANGIOPLASTY WITH STENT PLACEMENT  05/1997   "1"   CORONARY ARTERY BYPASS GRAFT  03/2000   "CABG X5"   ECTROPION REPAIR Right 09/01/2018   Procedure: REPAIR OF ECTROPION BILATERAL upper and lower;  Surgeon: Karle Starch, MD;  Location: [place];  Service: Ophthalmology;  Laterality: Right;  Diabetic - oral meds sleep apnea   ESOPHAGEAL DILATION  X 3-4   Dr. Lyla Son; "last one was in the 1990's"   ESOPHAGOGASTRODUODENOSCOPY     multiple   FLEXIBLE SIGMOIDOSCOPY     multiple   HYDRADENITIS EXCISION Left 02/25/2017   Procedure: EXCISION DEEP LEFT AXILLARY LYMPH NODE;  Surgeon: Fanny Skates, MD;  Location: [place];  Service: General;   Laterality: Left;   INTRAOPERATIVE TRANSTHORACIC ECHOCARDIOGRAM N/A 09/09/2017   Procedure: INTRAOPERATIVE TRANSTHORACIC ECHOCARDIOGRAM;  Surgeon: Burnell Blanks, MD;  Location: [place];  Service: Open Heart Surgery;  Laterality: N/A;   KNEE ARTHROSCOPY Left 2011   meniscus repair   LEFT HEART CATHETERIZATION WITH CORONARY/GRAFT ANGIOGRAM N/A 03/16/2014   Procedure: LEFT HEART CATHETERIZATION WITH Beatrix Fetters;  Surgeon: Burnell Blanks, MD;  Location: [facility] CATH [facility];  Service: Cardiovascular;  Laterality: N/A;   LUMBAR LAMINECTOMY/DECOMPRESSION MICRODISCECTOMY Right 06/17/2013   Procedure: LUMBAR LAMINECTOMY MICRODISCECTOMY L4-L5 RIGHT EXCISION OF SYNOVIAL CYST RIGHT   (1 LEVEL) RIGHT PARTIAL FACETECTOMY;  Surgeon: Tobi Bastos, MD;  Location: [facility];  Service: Orthopedics;  Laterality: Right;   MYELOGRAM  04/06/13   lumbar, Dr Gladstone Lighter   ORBITAL LESION EXCISION Right 09/01/2018   Procedure: ORBITOTOMY WITHOUT BONE FLAP WITH REMOVAL OF LESION RIGHT;  Surgeon: Karle Starch, MD;  Location: [place];  Service: Ophthalmology;  Laterality: Right;   PACEMAKER IMPLANT N/A 09/10/2017   Procedure: PACEMAKER IMPLANT;  Surgeon: Deboraha Sprang, MD;  Location: [place] [facility];  Service: Cardiovascular;  Laterality: N/A;   PANENDOSCOPY     PORTACATH PLACEMENT N/A 03/06/2017   Procedure: INSERTION PORT-A-CATH AND ASPIRATE SEROMA LEFT AXILLA;  Surgeon: Fanny Skates, MD;  Location: [place];  Service: General;  Laterality: N/A;   RIGHT/LEFT HEART CATH AND CORONARY/GRAFT ANGIOGRAPHY N/A 07/28/2017   Procedure: RIGHT/LEFT HEART CATH AND CORONARY/GRAFT ANGIOGRAPHY;  Surgeon: Sherren Mocha, MD;  Location: [place] [facility];  Service: Cardiovascular;  Laterality: N/A;   SHOULDER SURGERY Right 08/2010   screws placed; "tendons tore off"   SKIN CANCER EXCISION Right    "neck"   TONSILLECTOMY  ~ [place] Left 06/29/2019   Procedure: TOTAL KNEE ARTHROPLASTY;   Surgeon: Paralee Cancel, MD;  Location: [facility];  Service: Orthopedics;  Laterality: Left;  70 mins   TRANSCATHETER AORTIC VALVE REPLACEMENT, TRANSFEMORAL N/A 09/09/2017   Procedure: TRANSCATHETER AORTIC VALVE REPLACEMENT, TRANSFEMORAL;  Surgeon: Burnell Blanks, MD;  Location: [place];  Service: Open Heart Surgery;  Laterality: N/A;   UPPER EXTREMITY VENOGRAPHY Left 02/15/2021   Procedure: CENTRAL VENO;  Surgeon: [name], MD;  Location: [place] [facility];  Service: Cardiovascular;  Laterality: Left;   UPPER GI ENDOSCOPY  2013   Gastritis; Dr Carlean Purl   VASECTOMY      Family History  Problem Relation Age of Onset   Stroke Father    Hypertension Father    Pancreatic cancer Mother    Diabetes Maternal Grandmother    Stroke Maternal Grandmother    Heart attack Paternal Grandmother    Colon  cancer Neg Hx    Esophageal cancer Neg Hx    Rectal cancer Neg Hx    Stomach cancer Neg Hx    Ulcers Neg Hx     SOCIAL HISTORY: Social History   Socioeconomic History   Marital status: Divorced    Spouse name: Not on file   Number of children: 2   Years of education: college   Highest education level: Not on file  Occupational History    Employer: RETIRED  Tobacco Use   Smoking status: Never   Smokeless tobacco: Never  Vaping Use   Vaping Use: Never used  Substance and Sexual Activity   Alcohol use: No    Alcohol/week: 0.0 standard drinks    Comment: "last drink was in 2012"( 03/15/2014)   Drug use: No   Sexual activity: Not Currently  Other Topics Concern   Not on file  Social History Narrative   ** Merged History Encounter **       Divorced, lives with a roommate. 1 son one daughter 3-4 caffeinated beverages daily Right-handed. 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   Social Determinants of Health   Financial Resource Strain: Low Risk     Difficulty of Paying Living Expenses: Not hard at all  Food Insecurity: Not on file  Transportation Needs: Not on file  Physical Activity: Not on file  Stress: Not on file  Social Connections: Not on file  Intimate Partner Violence: Not on file    Allergies  Allergen Reactions   Benazepril Swelling    angioedema; he is not a candidate for any angiotensin receptor blockers because of this significant allergic reaction. Because of a history of documented adverse serious drug reaction;Medi Alert bracelet  is recommended   Hctz [Hydrochlorothiazide] Anaphylaxis and Swelling    Tongue and lip swelling    Aspirin Other (See Comments)    Gastritis, cant take 325 Mg aspirin    Lactose Intolerance (Gi) Nausea And Vomiting    Current Outpatient Medications  Medication Sig Dispense Refill   acetaminophen (TYLENOL) 325 MG tablet Take 650 mg by mouth at bedtime as needed for moderate pain or headache.     allopurinol (ZYLOPRIM) 300 MG tablet Take 450 mg by mouth daily.     aluminum hydroxide-magnesium carbonate (GAVISCON) 95-358 MG/15ML SUSP Take 15 mLs by mouth as needed for indigestion or heartburn.     amLODipine (NORVASC) 5 MG tablet TAKE 1 TABLET BY MOUTH TWICE A DAY 180 tablet 3   apixaban (ELIQUIS) 5 MG TABS tablet Take 1 tablet (5 mg total) by mouth 2 (two) times daily. 180 tablet 1   atorvastatin (LIPITOR) 80 MG tablet TAKE ONE TABLET BY MOUTH AT BEDTIME 90 tablet 3   azelastine (ASTELIN) 0.1 % nasal spray Place 2 sprays into both nostrils at bedtime as needed for rhinitis or allergies. 30 mL 12   dapagliflozin propanediol (FARXIGA) 10 MG TABS tablet Take 10 mg by mouth daily.     esomeprazole (NEXIUM) 40 MG capsule Take 1 capsule (40 mg total) by mouth daily. 30 capsule 5   ezetimibe (ZETIA) 10 MG tablet TAKE 1 TABLET BY MOUTH EVERY DAY 90 tablet 3   fenofibrate 160 MG tablet TAKE 1 TABLET BY MOUTH EVERY DAY 90 tablet 0   folic acid (FOLVITE) 1 MG tablet TAKE 2 TABLETS BY MOUTH  EVERY DAY 180 tablet  4   FREESTYLE LITE test strip USE TO TEST BLOOD SUGAR ONCE A DAY. DX CODE: E11.9 100 strip 12   glimepiride (AMARYL) 1 MG tablet TAKE 1 TABLET BY MOUTH DAILY WITH BREAKFAST. 90 tablet 1   Lancets (FREESTYLE) lancets USE TWICE A DAY TO CHECK BLOOD SUGAR. DX E11.9 100 each 6   metFORMIN (GLUCOPHAGE) 1000 MG tablet TAKE 1 & 1/2 TABLETS BY MOUTH EVERY MORNING AND 1 TABLET IN THE EVENING. 225 tablet 1   metoprolol tartrate (LOPRESSOR) 25 MG tablet TAKE ONE-HALF TABLET BY MOUTH TWICE DAILY 90 tablet 3   nitroGLYCERIN (NITROSTAT) 0.4 MG SL tablet PLACE 1 TABLET (0.4 MG TOTAL) UNDER THE TONGUE EVERY 5 (FIVE) MINUTES AS NEEDED FOR CHEST PAIN. 25 tablet 6   potassium chloride SA (KLOR-CON M20) 20 MEQ tablet Take 2 tablets (40 mEq total) by mouth 2 (two) times daily. 360 tablet 3   pregabalin (LYRICA) 150 MG capsule TAKE 1 CAPSULE BY MOUTH TWICE A DAY 180 capsule 0   psyllium (METAMUCIL) 58.6 % powder Take 1 packet by mouth daily as needed (constipation).     torsemide (DEMADEX) 20 MG tablet Take 20 mg by mouth daily.     VASCEPA 1 g capsule TAKE 2 CAPSULES BY MOUTH TWICE A DAY 120 capsule 5   No current facility-administered medications for this visit.   Facility-Administered Medications Ordered in Other Visits  Medication Dose Route Frequency Provider Last Rate Last Admin   sodium chloride flush (NS) 0.9 % injection 10 mL  10 mL Intravenous PRN [name], MD   10 mL at 05/30/17 0920    ROS:   General:  No weight loss, Fever, chills  HEENT: No recent headaches, no nasal bleeding, no visual changes, no sore throat  Neurologic: No dizziness, blackouts, seizures. No recent symptoms of stroke or mini- stroke. No recent episodes of slurred speech, or temporary blindness.  Cardiac: No recent episodes of chest pain/pressure, no shortness of breath at rest.  No shortness of breath with exertion.  Denies history of atrial fibrillation or irregular heartbeat  Vascular: No  history of rest pain in feet.  No history of claudication.  No history of non-healing ulcer, No history of DVT   Pulmonary: No home oxygen, no productive cough, no hemoptysis,  No asthma or wheezing  Musculoskeletal:  [ ]  Arthritis, [ ]  Low back pain,  [ ]  Joint pain  Hematologic:No history of hypercoagulable state.  No history of easy bleeding.  No history of anemia  Gastrointestinal: No hematochezia or melena,  No gastroesophageal reflux, no trouble swallowing  Urinary: [ ]  chronic Kidney disease, [ ]  on HD - [ ]  MWF or [ ]  TTHS, [ ]  Burning with urination, [ ]  Frequent urination, [ ]  Difficulty urinating;   Skin: No rashes  Psychological: No history of anxiety,  No history of depression   Physical Examination  Vitals:   03/13/21 1409  BP: 117/62  Pulse: 75  Resp: 20  Temp: 98.7 F (37.1 C)  SpO2: 94%  Weight: 247 lb (112 kg)  Height: 6' (1.829 m)     Body mass index is 33.5 kg/m.  General:  Alert and oriented, no acute distress HEENT: Normal Neck: No bruit or JVD Pulmonary: Clear to auscultation bilaterally, large network of chest wall collaterals extending up into the left neck and left anterior chest, left side pacemaker, right side Port-A-Cath Cardiac: Regular Rate and Rhythm  Abdomen: Soft, non-tender, non-distended, no mass, no scars Skin: No rash  Extremity Pulses:  2+ radial, brachial pulses bilaterally Musculoskeletal: No deformity diffuse left upper extremity edema approximately 50% larger than the right arm Neurologic: Upper and lower extremity motor 5/5 and symmetric  DATA:  Venogram again reviewed - left subclavian stenosis  ASSESSMENT:  Left upper extremity edema secondary to subclavian vein stenosis about pacemaker wire insertion.   PLAN: Left subclavian venoplasty 03/16/21  [name]. Stanford Breed, MD [facility] Phone Number: [phone] 03/13/2021 4:30 PM

## 2021-03-13 NOTE — Progress Notes (Signed)
Referring Physician: Dr. Rushie Nyhan  Patient name: Richard [name] MRN: [id number] DOB: Jul 26, 1940 Sex: male  REASON FOR CONSULT: Left arm swelling  HPI: JARETT DRALLE is a 80 y.o. male, with a 5 to 40-month history of increased left arm swelling.  His left arm is about 50% larger than his right arm.  He recently had an ulceration on his left hand and had some clear drainage from this.  He states the hand does have less swelling by the morning time if he elevates it while he is sleeping.  He has no prior history of DVT.  He does have a left-sided pacemaker with leads through the left subclavian vein.  He has also had axillary lymph nodes removed from the left side.  He has a right side Port-A-Cath.  The swelling is minimally bothersome to him.  He is still able to play golf and do his daily activities as he wishes.    02/13/21: Patient reports persistent left upper extremity swelling.  He saw his electrophysiologist who recommended central venogram before considering receding of pacemaker which seems to be working quite well for him.  03/13/21: Returns to discuss options. EP reviewed images and recommended venoplasty.  No change in left upper extremity symptoms.   Past Medical History:  Diagnosis Date   Anemia    Arthritis    hips   Axillary adenopathy 02/25/2017   Bradycardia    a. holter monitor has demonstrated HRs in 30s and [place]    CAD (coronary artery disease)    a. s/p CABG 2001  b.  07/28/2017 cath:   Severe three-vessel native CAD with total occlusion of LAD, ramus intermedius, first OM and RCA, patent RIMA to PDA, LIMA to LAD, sequential SVG to ramus intermedius and first OM.     Chronic lower back pain    Diffuse large B cell lymphoma (HCC)    Diverticulosis    Esophageal stricture    GERD (gastroesophageal reflux disease)    History of gout    HTN (hypertension)    Mixed hyperlipidemia    OSA on CPAP    with 2L O2 at night   Pancytopenia ([place])    a. related to chemo  therapy for B cell lymphoma   Peptic stricture of esophagus    Presence of permanent cardiac pacemaker    sees Dr. Valaria Good pacemaker   SCC (squamous cell carcinoma) 01/31/2021   R zygoma, [facility]   SCC (squamous cell carcinoma) 01/31/2021   L post ankle, [facility]   SCC (squamous cell carcinoma) 01/31/2021   L popliteal, [facility]   Severe aortic stenosis    Spinal stenosis    Squamous cell carcinoma of skin 03/22/2009   Right mandible. SCCis, hypertrophic.    Type II diabetes mellitus ([place])    Wears dentures    partial upper   Past Surgical History:  Procedure Laterality Date   APPENDECTOMY  ~ [place] Left 11/06/2016   Procedure: CATARACT EXTRACTION PHACO AND INTRAOCULAR LENS PLACEMENT (IOC);  Surgeon: Estill Cotta, MD;  Location: [facility];  Service: Ophthalmology;  Laterality: Left;  Lot # [id number] H [place]: 01:09.4 AP%:25.2 CDE: 30.64   CATARACT EXTRACTION W/PHACO Right 12/04/2016   Procedure: CATARACT EXTRACTION PHACO AND INTRAOCULAR LENS PLACEMENT (IOC);  Surgeon: Estill Cotta, MD;  Location: [facility];  Service: Ophthalmology;  Laterality: Right;  [place] 1:25.9 AP% 24.1 CDE 39.10 Fluid Pack lot # [id number]   COLONOSCOPY W/  BIOPSIES AND POLYPECTOMY  2013   CORONARY ANGIOPLASTY  1993   CORONARY ANGIOPLASTY WITH STENT PLACEMENT  05/1997   "1"   CORONARY ARTERY BYPASS GRAFT  03/2000   "CABG X5"   ECTROPION REPAIR Right 09/01/2018   Procedure: REPAIR OF ECTROPION BILATERAL upper and lower;  Surgeon: Karle Starch, MD;  Location: [place];  Service: Ophthalmology;  Laterality: Right;  Diabetic - oral meds sleep apnea   ESOPHAGEAL DILATION  X 3-4   Dr. Lyla Son; "last one was in the 1990's"   ESOPHAGOGASTRODUODENOSCOPY     multiple   FLEXIBLE SIGMOIDOSCOPY     multiple   HYDRADENITIS EXCISION Left 02/25/2017   Procedure: EXCISION DEEP LEFT AXILLARY LYMPH NODE;  Surgeon: Fanny Skates, MD;  Location: [place];  Service: General;   Laterality: Left;   INTRAOPERATIVE TRANSTHORACIC ECHOCARDIOGRAM N/A 09/09/2017   Procedure: INTRAOPERATIVE TRANSTHORACIC ECHOCARDIOGRAM;  Surgeon: Burnell Blanks, MD;  Location: [place];  Service: Open Heart Surgery;  Laterality: N/A;   KNEE ARTHROSCOPY Left 2011   meniscus repair   LEFT HEART CATHETERIZATION WITH CORONARY/GRAFT ANGIOGRAM N/A 03/16/2014   Procedure: LEFT HEART CATHETERIZATION WITH Beatrix Fetters;  Surgeon: Burnell Blanks, MD;  Location: [facility];  Service: Cardiovascular;  Laterality: N/A;   LUMBAR LAMINECTOMY/DECOMPRESSION MICRODISCECTOMY Right 06/17/2013   Procedure: LUMBAR LAMINECTOMY MICRODISCECTOMY L4-L5 RIGHT EXCISION OF SYNOVIAL CYST RIGHT   (1 LEVEL) RIGHT PARTIAL FACETECTOMY;  Surgeon: Tobi Bastos, MD;  Location: [facility];  Service: Orthopedics;  Laterality: Right;   MYELOGRAM  04/06/13   lumbar, Dr Gladstone Lighter   ORBITAL LESION EXCISION Right 09/01/2018   Procedure: ORBITOTOMY WITHOUT BONE FLAP WITH REMOVAL OF LESION RIGHT;  Surgeon: Karle Starch, MD;  Location: [place];  Service: Ophthalmology;  Laterality: Right;   PACEMAKER IMPLANT N/A 09/10/2017   Procedure: PACEMAKER IMPLANT;  Surgeon: Deboraha Sprang, MD;  Location: [place] [facility];  Service: Cardiovascular;  Laterality: N/A;   PANENDOSCOPY     PORTACATH PLACEMENT N/A 03/06/2017   Procedure: INSERTION PORT-A-CATH AND ASPIRATE SEROMA LEFT AXILLA;  Surgeon: Fanny Skates, MD;  Location: [place];  Service: General;  Laterality: N/A;   RIGHT/LEFT HEART CATH AND CORONARY/GRAFT ANGIOGRAPHY N/A 07/28/2017   Procedure: RIGHT/LEFT HEART CATH AND CORONARY/GRAFT ANGIOGRAPHY;  Surgeon: Sherren Mocha, MD;  Location: [place] [facility];  Service: Cardiovascular;  Laterality: N/A;   SHOULDER SURGERY Right 08/2010   screws placed; "tendons tore off"   SKIN CANCER EXCISION Right    "neck"   TONSILLECTOMY  ~ [place] Left 06/29/2019   Procedure: TOTAL KNEE ARTHROPLASTY;   Surgeon: Paralee Cancel, MD;  Location: [facility];  Service: Orthopedics;  Laterality: Left;  70 mins   TRANSCATHETER AORTIC VALVE REPLACEMENT, TRANSFEMORAL N/A 09/09/2017   Procedure: TRANSCATHETER AORTIC VALVE REPLACEMENT, TRANSFEMORAL;  Surgeon: Burnell Blanks, MD;  Location: [place];  Service: Open Heart Surgery;  Laterality: N/A;   UPPER EXTREMITY VENOGRAPHY Left 02/15/2021   Procedure: CENTRAL VENO;  Surgeon: [name], MD;  Location: [place] [facility];  Service: Cardiovascular;  Laterality: Left;   UPPER GI ENDOSCOPY  2013   Gastritis; Dr Carlean Purl   VASECTOMY      Family History  Problem Relation Age of Onset   Stroke Father    Hypertension Father    Pancreatic cancer Mother    Diabetes Maternal Grandmother    Stroke Maternal Grandmother    Heart attack Paternal Grandmother    Colon  cancer Neg Hx    Esophageal cancer Neg Hx    Rectal cancer Neg Hx    Stomach cancer Neg Hx    Ulcers Neg Hx     SOCIAL HISTORY: Social History   Socioeconomic History   Marital status: Divorced    Spouse name: Not on file   Number of children: 2   Years of education: college   Highest education level: Not on file  Occupational History    Employer: RETIRED  Tobacco Use   Smoking status: Never   Smokeless tobacco: Never  Vaping Use   Vaping Use: Never used  Substance and Sexual Activity   Alcohol use: No    Alcohol/week: 0.0 standard drinks    Comment: "last drink was in 2012"( 03/15/2014)   Drug use: No   Sexual activity: Not Currently  Other Topics Concern   Not on file  Social History Narrative   ** Merged History Encounter **       Divorced, lives with a roommate. 1 son one daughter 3-4 caffeinated beverages daily Right-handed. 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   Social Determinants of Health   Financial Resource Strain: Low Risk     Difficulty of Paying Living Expenses: Not hard at all  Food Insecurity: Not on file  Transportation Needs: Not on file  Physical Activity: Not on file  Stress: Not on file  Social Connections: Not on file  Intimate Partner Violence: Not on file    Allergies  Allergen Reactions   Benazepril Swelling    angioedema; he is not a candidate for any angiotensin receptor blockers because of this significant allergic reaction. Because of a history of documented adverse serious drug reaction;Medi Alert bracelet  is recommended   Hctz [Hydrochlorothiazide] Anaphylaxis and Swelling    Tongue and lip swelling    Aspirin Other (See Comments)    Gastritis, cant take 325 Mg aspirin    Lactose Intolerance (Gi) Nausea And Vomiting    Current Outpatient Medications  Medication Sig Dispense Refill   acetaminophen (TYLENOL) 325 MG tablet Take 650 mg by mouth at bedtime as needed for moderate pain or headache.     allopurinol (ZYLOPRIM) 300 MG tablet Take 450 mg by mouth daily.     aluminum hydroxide-magnesium carbonate (GAVISCON) 95-358 MG/15ML SUSP Take 15 mLs by mouth as needed for indigestion or heartburn.     amLODipine (NORVASC) 5 MG tablet TAKE 1 TABLET BY MOUTH TWICE A DAY 180 tablet 3   apixaban (ELIQUIS) 5 MG TABS tablet Take 1 tablet (5 mg total) by mouth 2 (two) times daily. 180 tablet 1   atorvastatin (LIPITOR) 80 MG tablet TAKE ONE TABLET BY MOUTH AT BEDTIME 90 tablet 3   azelastine (ASTELIN) 0.1 % nasal spray Place 2 sprays into both nostrils at bedtime as needed for rhinitis or allergies. 30 mL 12   dapagliflozin propanediol (FARXIGA) 10 MG TABS tablet Take 10 mg by mouth daily.     esomeprazole (NEXIUM) 40 MG capsule Take 1 capsule (40 mg total) by mouth daily. 30 capsule 5   ezetimibe (ZETIA) 10 MG tablet TAKE 1 TABLET BY MOUTH EVERY DAY 90 tablet 3   fenofibrate 160 MG tablet TAKE 1 TABLET BY MOUTH EVERY DAY 90 tablet 0   folic acid (FOLVITE) 1 MG tablet TAKE 2 TABLETS BY MOUTH  EVERY DAY 180 tablet  4   FREESTYLE LITE test strip USE TO TEST BLOOD SUGAR ONCE A DAY. DX CODE: E11.9 100 strip 12   glimepiride (AMARYL) 1 MG tablet TAKE 1 TABLET BY MOUTH DAILY WITH BREAKFAST. 90 tablet 1   Lancets (FREESTYLE) lancets USE TWICE A DAY TO CHECK BLOOD SUGAR. DX E11.9 100 each 6   metFORMIN (GLUCOPHAGE) 1000 MG tablet TAKE 1 & 1/2 TABLETS BY MOUTH EVERY MORNING AND 1 TABLET IN THE EVENING. 225 tablet 1   metoprolol tartrate (LOPRESSOR) 25 MG tablet TAKE ONE-HALF TABLET BY MOUTH TWICE DAILY 90 tablet 3   nitroGLYCERIN (NITROSTAT) 0.4 MG SL tablet PLACE 1 TABLET (0.4 MG TOTAL) UNDER THE TONGUE EVERY 5 (FIVE) MINUTES AS NEEDED FOR CHEST PAIN. 25 tablet 6   potassium chloride SA (KLOR-CON M20) 20 MEQ tablet Take 2 tablets (40 mEq total) by mouth 2 (two) times daily. 360 tablet 3   pregabalin (LYRICA) 150 MG capsule TAKE 1 CAPSULE BY MOUTH TWICE A DAY 180 capsule 0   psyllium (METAMUCIL) 58.6 % powder Take 1 packet by mouth daily as needed (constipation).     torsemide (DEMADEX) 20 MG tablet Take 20 mg by mouth daily.     VASCEPA 1 g capsule TAKE 2 CAPSULES BY MOUTH TWICE A DAY 120 capsule 5   No current facility-administered medications for this visit.   Facility-Administered Medications Ordered in Other Visits  Medication Dose Route Frequency Provider Last Rate Last Admin   sodium chloride flush (NS) 0.9 % injection 10 mL  10 mL Intravenous PRN [name], MD   10 mL at 05/30/17 0920    ROS:   General:  No weight loss, Fever, chills  HEENT: No recent headaches, no nasal bleeding, no visual changes, no sore throat  Neurologic: No dizziness, blackouts, seizures. No recent symptoms of stroke or mini- stroke. No recent episodes of slurred speech, or temporary blindness.  Cardiac: No recent episodes of chest pain/pressure, no shortness of breath at rest.  No shortness of breath with exertion.  Denies history of atrial fibrillation or irregular heartbeat  Vascular: No  history of rest pain in feet.  No history of claudication.  No history of non-healing ulcer, No history of DVT   Pulmonary: No home oxygen, no productive cough, no hemoptysis,  No asthma or wheezing  Musculoskeletal:  [ ]  Arthritis, [ ]  Low back pain,  [ ]  Joint pain  Hematologic:No history of hypercoagulable state.  No history of easy bleeding.  No history of anemia  Gastrointestinal: No hematochezia or melena,  No gastroesophageal reflux, no trouble swallowing  Urinary: [ ]  chronic Kidney disease, [ ]  on HD - [ ]  MWF or [ ]  TTHS, [ ]  Burning with urination, [ ]  Frequent urination, [ ]  Difficulty urinating;   Skin: No rashes  Psychological: No history of anxiety,  No history of depression   Physical Examination  Vitals:   03/13/21 1409  BP: 117/62  Pulse: 75  Resp: 20  Temp: 98.7 F (37.1 C)  SpO2: 94%  Weight: 247 lb (112 kg)  Height: 6' (1.829 m)     Body mass index is 33.5 kg/m.  General:  Alert and oriented, no acute distress HEENT: Normal Neck: No bruit or JVD Pulmonary: Clear to auscultation bilaterally, large network of chest wall collaterals extending up into the left neck and left anterior chest, left side pacemaker, right side Port-A-Cath Cardiac: Regular Rate and Rhythm  Abdomen: Soft, non-tender, non-distended, no mass, no scars Skin: No rash  Extremity Pulses:  2+ radial, brachial pulses bilaterally Musculoskeletal: No deformity diffuse left upper extremity edema approximately 50% larger than the right arm Neurologic: Upper and lower extremity motor 5/5 and symmetric  DATA:  Venogram again reviewed - left subclavian stenosis  ASSESSMENT:  Left upper extremity edema secondary to subclavian vein stenosis about pacemaker wire insertion.   PLAN: Left subclavian venoplasty 03/16/21  [name]. Stanford Breed, MD [facility] Phone Number: [phone] 03/13/2021 4:30 PM

## 2021-03-15 ENCOUNTER — Encounter (HOSPITAL_BASED_OUTPATIENT_CLINIC_OR_DEPARTMENT_OTHER): Payer: Medicare Other | Admitting: Internal Medicine

## 2021-03-15 ENCOUNTER — Other Ambulatory Visit: Payer: Self-pay

## 2021-03-15 DIAGNOSIS — Z09 Encounter for follow-up examination after completed treatment for conditions other than malignant neoplasm: Secondary | ICD-10-CM | POA: Diagnosis not present

## 2021-03-15 DIAGNOSIS — S81801D Unspecified open wound, right lower leg, subsequent encounter: Secondary | ICD-10-CM

## 2021-03-15 DIAGNOSIS — S81801A Unspecified open wound, right lower leg, initial encounter: Secondary | ICD-10-CM | POA: Diagnosis not present

## 2021-03-15 NOTE — Progress Notes (Signed)
RAMIR, MALERBA ([id number]) Visit Report for 03/15/2021 Arrival Information Details Patient Name: Date of Service: Richard Killings [name] 03/15/2021 9:00 A M Medical Record Number: [id number] Patient Account Number: [id number] Date of Birth/Sex: Treating RN: 1941/05/11 (80 y.o. [name], [name] Primary Care Sakai Heinle: Roma Schanz Other Clinician: Referring Eletha Culbertson: Treating Baelyn Doring/Extender: Tharon Aquas in Treatment: 8 Visit Information History Since Last Visit Added or deleted any medications: No Patient Arrived: Ambulatory Any new allergies or adverse reactions: No Arrival Time: 08:37 Had a fall or experienced change in No Accompanied By: self activities of daily living that may affect Transfer Assistance: None risk of falls: Patient Identification Verified: Yes Signs or symptoms of abuse/neglect since last visito No Secondary Verification Process Completed: Yes Hospitalized since last visit: No Patient Requires Transmission-Based Precautions: No Implantable device outside of the clinic excluding No Patient Has Alerts: Yes cellular tissue based products placed in the center Patient Alerts: Patient on Blood Thinner since last visit: Has Dressing in Place as Prescribed: Yes Pain Present Now: No Electronic Signature(s) Signed: 03/15/2021 11:03:59 AM By: Sandre Kitty Entered By: Sandre Kitty on 03/15/2021 08:39:06 -------------------------------------------------------------------------------- Clinic Level of Care Assessment Details Patient Name: Date of Service: Richard Killings [name] 03/15/2021 9:00 A M Medical Record Number: [id number] Patient Account Number: [id number] Date of Birth/Sex: Treating RN: 1940-12-27 (80 y.o. [name], [name] Primary Care Krishna Heuer: Roma Schanz Other Clinician: Referring Casidee Jann: Treating Coburn Knaus/Extender: Tharon Aquas in Treatment: 8 Clinic Level of Care  Assessment Items TOOL 4 Quantity Score X- 1 0 Use when only an EandM is performed on FOLLOW-UP visit ASSESSMENTS - Nursing Assessment / Reassessment X- 1 10 Reassessment of Co-morbidities (includes updates in patient status) X- 1 5 Reassessment of Adherence to Treatment Plan ASSESSMENTS - Wound and Skin A ssessment / Reassessment X - Simple Wound Assessment / Reassessment - one wound 1 5 []  - 0 Complex Wound Assessment / Reassessment - multiple wounds X- 1 10 Dermatologic / Skin Assessment (not related to wound area) ASSESSMENTS - Focused Assessment []  - 0 Circumferential Edema Measurements - multi extremities []  - 0 Nutritional Assessment / Counseling / Intervention []  - 0 Lower Extremity Assessment (monofilament, tuning fork, pulses) []  - 0 Peripheral Arterial Disease Assessment (using hand held doppler) ASSESSMENTS - Ostomy and/or Continence Assessment and Care []  - 0 Incontinence Assessment and Management []  - 0 Ostomy Care Assessment and Management (repouching, etc.) PROCESS - Coordination of Care X - Simple Patient / Family Education for ongoing care 1 15 []  - 0 Complex (extensive) Patient / Family Education for ongoing care X- 1 10 Staff obtains Programmer, systems, Records, T Results / Process Orders est []  - 0 Staff telephones HHA, Nursing Homes / Clarify orders / etc []  - 0 Routine Transfer to another Facility (non-emergent condition) []  - 0 Routine Hospital Admission (non-emergent condition) []  - 0 New Admissions / Biomedical engineer / Ordering NPWT Apligraf, etc. , []  - 0 Emergency Hospital Admission (emergent condition) X- 1 10 Simple Discharge Coordination []  - 0 Complex (extensive) Discharge Coordination PROCESS - Special Needs []  - 0 Pediatric / Minor Patient Management []  - 0 Isolation Patient Management []  - 0 Hearing / Language / Visual special needs []  - 0 Assessment of Community assistance (transportation, D/C planning, etc.) []  -  0 Additional assistance / Altered mentation []  - 0 Support Surface(s) Assessment (bed, cushion, seat, etc.) INTERVENTIONS - Wound Cleansing / Measurement X - Simple Wound Cleansing - one wound 1 5 []  -  0 Complex Wound Cleansing - multiple wounds X- 1 5 Wound Imaging (photographs - any number of wounds) []  - 0 Wound Tracing (instead of photographs) X- 1 5 Simple Wound Measurement - one wound []  - 0 Complex Wound Measurement - multiple wounds INTERVENTIONS - Wound Dressings X - Small Wound Dressing one or multiple wounds 1 10 []  - 0 Medium Wound Dressing one or multiple wounds []  - 0 Large Wound Dressing one or multiple wounds []  - 0 Application of Medications - topical []  - 0 Application of Medications - injection INTERVENTIONS - Miscellaneous []  - 0 External ear exam []  - 0 Specimen Collection (cultures, biopsies, blood, body fluids, etc.) []  - 0 Specimen(s) / Culture(s) sent or taken to Lab for analysis []  - 0 Patient Transfer (multiple staff / Civil Service fast streamer / Similar devices) []  - 0 Simple Staple / Suture removal (25 or less) []  - 0 Complex Staple / Suture removal (26 or more) []  - 0 Hypo / Hyperglycemic Management (close monitor of Blood Glucose) []  - 0 Ankle / Brachial Index (ABI) - do not check if billed separately X- 1 5 Vital Signs Has the patient been seen at the hospital within the last three years: Yes Total Score: 95 Level Of Care: New/Established - Level 3 Electronic Signature(s) Signed: 03/15/2021 4:34:42 PM By: Rhae Hammock RN Entered By: Rhae Hammock on 03/15/2021 08:53:36 -------------------------------------------------------------------------------- Encounter Discharge Information Details Patient Name: Date of Service: Richard [name], Richard Larsson [name] 03/15/2021 9:00 A M Medical Record Number: [id number] Patient Account Number: [id number] Date of Birth/Sex: Treating RN: Nov 29, 1940 (80 y.o. [name], [name] Primary Care Beula Joyner: Roma Schanz  Other Clinician: Referring Eshawn Coor: Treating Samad Thon/Extender: Tharon Aquas in Treatment: 8 Encounter Discharge Information Items Discharge Condition: Stable Ambulatory Status: Ambulatory Discharge Destination: Home Transportation: Private Auto Accompanied By: self Schedule Follow-up Appointment: Yes Clinical Summary of Care: Patient Declined Electronic Signature(s) Signed: 03/15/2021 4:34:42 PM By: Rhae Hammock RN Entered By: Rhae Hammock on 03/15/2021 08:54:20 -------------------------------------------------------------------------------- Lower Extremity Assessment Details Patient Name: Date of Service: Richard [name], Richard Larsson [name] 03/15/2021 9:00 A M Medical Record Number: [id number] Patient Account Number: [id number] Date of Birth/Sex: Treating RN: [birth year] (80 y.o. [name], [name] Primary Care Lopez Dentinger: Roma Schanz Other Clinician: Referring Jaylean Buenaventura: Treating Robertta Halfhill/Extender: Tharon Aquas in Treatment: 8 Edema Assessment Assessed: Shirlyn Goltz: No] Patrice Paradise: Yes] Edema: [Left: Ye] [Right: s] Calf Left: Right: Point of Measurement: 30 cm From Medial Instep 33 cm Ankle Left: Right: Point of Measurement: 9 cm From Medial Instep 23 cm Vascular Assessment Pulses: Dorsalis Pedis Palpable: [Right:Yes] Posterior Tibial Palpable: [Right:Yes] Electronic Signature(s) Signed: 03/15/2021 4:34:42 PM By: Rhae Hammock RN Entered By: Rhae Hammock on 03/15/2021 08:50:08 -------------------------------------------------------------------------------- Multi Wound Chart Details Patient Name: Date of Service: Richard [name], Richard Larsson [name] 03/15/2021 9:00 A M Medical Record Number: [id number] Patient Account Number: [id number] Date of Birth/Sex: Treating RN: [birth year] (81 y.o. [name], [name] Primary Care Jahmil Macleod: Roma Schanz Other Clinician: Referring Olson Lucarelli: Treating Willis Holquin/Extender: Tharon Aquas in Treatment: 8 Vital Signs Height(in): 72 Capillary Blood Glucose(mg/dl): 123 Weight(lbs): 235 Pulse(bpm): 74 Body Mass Index(BMI): 32 Blood Pressure(mmHg): 123/70 Temperature(F): 98.1 Respiratory Rate(breaths/min): 17 Photos: [N/A:N/A] Right, Posterior Lower Leg N/A N/A Wound Location: Trauma N/A N/A Wounding Event: Trauma, Other N/A N/A Primary Etiology: Cataracts, Anemia, Sleep Apnea, N/A N/A Comorbid History: Arrhythmia, Congestive Heart Failure, Coronary Artery Disease, Hypertension, Type II Diabetes, Osteoarthritis, Neuropathy, Received Chemotherapy 12/24/2020 N/A N/A Date Acquired: 8 N/A N/A Weeks of Treatment:  Open N/A N/A Wound Status: 0.1x0.1x0.1 N/A N/A Measurements L x W x D (cm) 0.008 N/A N/A A (cm) : rea 0.001 N/A N/A Volume (cm) : 99.90% N/A N/A % Reduction in Area: 100.00% N/A N/A % Reduction in Volume: Full Thickness Without Exposed N/A N/A Classification: Support Structures Medium N/A N/A Exudate Amount: Serosanguineous N/A N/A Exudate Type: red, brown N/A N/A Exudate Color: Distinct, outline attached N/A N/A Wound Margin: Medium (34-66%) N/A N/A Granulation Amount: Red, Pink N/A N/A Granulation Quality: Medium (34-66%) N/A N/A Necrotic Amount: Fat Layer (Subcutaneous Tissue): Yes N/A N/A Exposed Structures: Fascia: No Tendon: No Muscle: No Joint: No Bone: No Medium (34-66%) N/A N/A Epithelialization: Treatment Notes Electronic Signature(s) Signed: 03/15/2021 8:57:24 AM By: Kalman Shan DO Signed: 03/15/2021 4:34:42 PM By: Rhae Hammock RN Entered By: Kalman Shan on 03/15/2021 08:50:53 -------------------------------------------------------------------------------- Multi-Disciplinary Care Plan Details Patient Name: Date of Service: Richard [name], Richard Larsson [name] 03/15/2021 9:00 A M Medical Record Number: [id number] Patient Account Number: [id number] Date of Birth/Sex: Treating  RN: [birth year] (80 y.o. [name], [name] Primary Care Aalliyah Kilker: Roma Schanz Other Clinician: Referring Lakelynn Severtson: Treating Grier Vu/Extender: Tharon Aquas in Treatment: [place] reviewed with physician Active Inactive Electronic Signature(s) Signed: 03/15/2021 4:34:42 PM By: Rhae Hammock RN Entered By: Rhae Hammock on 03/15/2021 08:52:52 -------------------------------------------------------------------------------- Pain Assessment Details Patient Name: Date of Service: Richard [name], Richard Larsson [name] 03/15/2021 9:00 A M Medical Record Number: [id number] Patient Account Number: [id number] Date of Birth/Sex: Treating RN: Feb 17, 1941 (80 y.o. [name], [name] Primary Care Jaxson Keener: Roma Schanz Other Clinician: Referring Oliveah Zwack: Treating Wilhelmina Hark/Extender: Tharon Aquas in Treatment: 8 Active Problems Location of Pain Severity and Description of Pain Patient Has Paino No Site Locations Pain Management and Medication Current Pain Management: Electronic Signature(s) Signed: 03/15/2021 11:03:59 AM By: Sandre Kitty Signed: 03/15/2021 4:34:42 PM By: Rhae Hammock RN Entered By: Sandre Kitty on 03/15/2021 08:39:35 -------------------------------------------------------------------------------- Patient/Caregiver Education Details Patient Name: Date of Service: Richard [name] 9/15/2022andnbsp9:00 A M Medical Record Number: [id number] Patient Account Number: [id number] Date of Birth/Gender: Treating RN: 04/08/1941 (80 y.o. Erie Noe Primary Care Physician: Roma Schanz Other Clinician: Referring Physician: Treating Physician/Extender: Tharon Aquas in Treatment: 8 Education Assessment Education Provided To: Patient Education Topics Provided Wound/Skin Impairment: Methods: Explain/Verbal Responses: State content  correctly Electronic Signature(s) Signed: 03/15/2021 4:34:42 PM By: Rhae Hammock RN Entered By: Rhae Hammock on 03/15/2021 08:53:02 -------------------------------------------------------------------------------- Wound Assessment Details Patient Name: Date of Service: Richard [name], Richard Larsson [name] 03/15/2021 9:00 A M Medical Record Number: [id number] Patient Account Number: [id number] Date of Birth/Sex: Treating RN: 1940-12-01 (80 y.o. [name], [name] Primary Care Aften Lipsey: Roma Schanz Other Clinician: Referring Klyde Banka: Treating Yeira Gulden/Extender: Tharon Aquas in Treatment: 8 Wound Status Wound Number: 2 Primary Trauma, Other Etiology: Wound Location: Right, Posterior Lower Leg Wound Healed - Epithelialized Wounding Event: Trauma Status: Date Acquired: 12/24/2020 Comorbid Cataracts, Anemia, Sleep Apnea, Arrhythmia, Congestive Heart Weeks Of Treatment: 8 History: Failure, Coronary Artery Disease, Hypertension, Type II Diabetes, Clustered Wound: No Osteoarthritis, Neuropathy, Received Chemotherapy Photos Wound Measurements Length: (cm) Width: (cm) Depth: (cm) Area: (cm) Volume: (cm) 0 % Reduction in Area: 100% 0 % Reduction in Volume: 100% 0 Epithelialization: Medium (34-66%) 0 0 Wound Description Classification: Full Thickness Without Exposed Support Structures Wound Margin: Distinct, outline attached Exudate Amount: Medium Exudate Type: Serosanguineous Exudate Color: red, brown Foul Odor After Cleansing: No Slough/Fibrino Yes Wound Bed Granulation Amount: Medium (34-66%) Exposed Structure Granulation  Quality: Red, Pink Fascia Exposed: No Necrotic Amount: Medium (34-66%) Fat Layer (Subcutaneous Tissue) Exposed: Yes Tendon Exposed: No Muscle Exposed: No Joint Exposed: No Bone Exposed: No Treatment Notes Wound #2 (Lower Leg) Wound Laterality: Right, Posterior Cleanser Peri-Wound Care Topical Primary  Dressing Secondary Dressing Secured With Compression Wrap Compression Stockings Add-Ons Electronic Signature(s) Signed: 03/15/2021 4:34:42 PM By: Rhae Hammock RN Entered By: Rhae Hammock on 03/15/2021 08:51:32 -------------------------------------------------------------------------------- Vitals Details Patient Name: Date of Service: Richard [name], Richard Larsson [name] 03/15/2021 9:00 A M Medical Record Number: [id number] Patient Account Number: [id number] Date of Birth/Sex: Treating RN: 1941/04/01 (81 y.o. [name], [name] Primary Care Briceida Rasberry: Roma Schanz Other Clinician: Referring Azarah Dacy: Treating Shreshta Medley/Extender: Tharon Aquas in Treatment: 8 Vital Signs Time Taken: 08:39 Temperature (F): 98.1 Height (in): 72 Pulse (bpm): 76 Weight (lbs): 235 Respiratory Rate (breaths/min): 17 Body Mass Index (BMI): 31.9 Blood Pressure (mmHg): 123/70 Capillary Blood Glucose (mg/dl): 123 Reference Range: 80 - 120 mg / dl Electronic Signature(s) Signed: 03/15/2021 11:03:59 AM By: Sandre Kitty Entered By: Sandre Kitty on 03/15/2021 79:15:05

## 2021-03-15 NOTE — Progress Notes (Signed)
[name], [name] ([id number]) Visit Report for 03/15/2021 Chief Complaint Document Details Patient Name: Date of Service: Richard [name] [name] 03/15/2021 9:00 A M Medical Record Number: [id number] Patient Account Number: [id number] Date of Birth/Sex: Treating RN: 26-Apr-1941 (80 y.o. Erie Noe Primary Care Provider: Roma Schanz Other Clinician: Referring Provider: Treating Provider/Extender: Tharon Aquas in Treatment: 8 Information Obtained from: Patient Chief Complaint Wounds to his left upper extremity and bilateral lower extremities. Electronic Signature(s) Signed: 03/15/2021 8:57:24 AM By: Kalman Shan DO Entered By: Kalman Shan on 03/15/2021 08:51:02 -------------------------------------------------------------------------------- HPI Details Patient Name: Date of Service: Richard [name], [name] 03/15/2021 9:00 A M Medical Record Number: [id number] Patient Account Number: [id number] Date of Birth/Sex: Treating RN: [birth year] (80 y.o. Erie Noe Primary Care Provider: Roma Schanz Other Clinician: Referring Provider: Treating Provider/Extender: Tharon Aquas in Treatment: 8 History of Present Illness HPI Description: Admission 7/15 [name] is an 80 year old male with a past medical history of type 2 diabetes on oral agents, diffuse large B-cell lymphoma, and coronary artery disease status post CABG that presents to the clinic for wounds to his left upper extremity and bilateral lower extremities. He states that he went to urgent care to be evaluated for a fever. And he was instructed to go to the ED to be further evaluated for possible sepsis. Unfortunately he passed out in his car After his appointment and was not discovered for another 3 hours. He was eventually discovered and EMS had to take him out of his vehicle. This is when he developed abrasions to his left arm and legs. He  has been using Xeroform to his wound beds every other day. He reports improvement to all the wounds except for the one on the back of his right leg. He denies pain or signs of infection. 7/22; patient presents for 1 week follow-up. He has no issues or complaints today. He has tolerated the compression wrap well on his right lower extremity. He has been able to do the dressing changes without issues to his left leg and left arm. His left arm wound has healed. He denies signs of infection. 7/29; patient presents for 1 week follow-up. He reports that his left leg wound is healed. He tolerated the compression wrap well on his right leg. He has no issues or complaints today. He denies signs of infection. 8/4; patient presents for 1 week follow-up. He continues to see improvement in wound healing to his right lower extremity. He has no issues or complaints today. He denies signs of infection. He does state that he went to the dermatologist and had some lesions removed on his face and left leg. He has been keeping these covered with a Band-Aid. 8/19; patient presents for follow-up. He is tolerated the compression wrap well. He has no issues or complaints today. He now only has 1 remaining wound. 9/1; patient presents for 1 week follow-up. He has tolerated the compression wrap well and has no issues or complaints today. He has been golfing 3 times weekly. He is in good spirits today. 9/15; patient presents for follow-up. He has no issues or complaints today. The wound is closed. Electronic Signature(s) Signed: 03/15/2021 8:57:24 AM By: Kalman Shan DO Entered By: Kalman Shan on 03/15/2021 08:51:30 -------------------------------------------------------------------------------- Physical Exam Details Patient Name: Date of Service: Richard [name], Pricilla [name] [name] 03/15/2021 9:00 A M Medical Record Number: [id number] Patient Account Number: [id number] Date of Birth/Sex: Treating RN: 1941-01-13 (80 y.o. M)  Rhae Hammock Primary Care Provider: Roma Schanz Other Clinician: Referring Provider: Treating Provider/Extender: Tharon Aquas in Treatment: 8 Constitutional respirations regular, non-labored and within target range for patient.[name] Psychiatric pleasant and cooperative. Notes Right lower extremity: T the posterior aspect there is epithelialization to previous wound site. No signs of infection. No surrounding skin breakdown. o Electronic Signature(s) Signed: 03/15/2021 8:57:24 AM By: Kalman Shan DO Entered By: Kalman Shan on 03/15/2021 08:56:10 -------------------------------------------------------------------------------- Physician Orders Details Patient Name: Date of Service: Richard [name], Pricilla [name] [name] 03/15/2021 9:00 A M Medical Record Number: [id number] Patient Account Number: [id number] Date of Birth/Sex: Treating RN: 1941-05-30 (80 y.o. Richard [name], [name] Primary Care Provider: Roma Schanz Other Clinician: Referring Provider: Treating Provider/Extender: Tharon Aquas in Treatment: 8 Verbal / Phone Orders: No Diagnosis Coding ICD-10 Coding Code Description S81.801D Unspecified open wound, right lower leg, subsequent encounter Discharge From [facility] Discharge from [place] Signature(s) Signed: 03/15/2021 8:57:24 AM By: Kalman Shan DO Entered By: Kalman Shan on 03/15/2021 08:56:21 -------------------------------------------------------------------------------- Problem List Details Patient Name: Date of Service: Richard [name], Pricilla [name] [name] 03/15/2021 9:00 A M Medical Record Number: [id number] Patient Account Number: [id number] Date of Birth/Sex: Treating RN: December 18, 1940 (80 y.o. Richard [name], [name] Primary Care Provider: Roma Schanz Other Clinician: Referring Provider: Treating Provider/Extender: Tharon Aquas in Treatment: 8 Active  Problems ICD-10 Encounter Code Description Active Date MDM Diagnosis S81.801D Unspecified open wound, right lower leg, subsequent encounter 01/19/2021 No Yes Inactive Problems ICD-10 Code Description Active Date Inactive Date S40.812A Abrasion of left upper arm, initial encounter 01/12/2021 01/12/2021 S81.801A Unspecified open wound, right lower leg, initial encounter 01/12/2021 01/12/2021 X52.841L Unspecified open wound, left lower leg, initial encounter 01/12/2021 01/12/2021 S81.802D Unspecified open wound, left lower leg, subsequent encounter 01/19/2021 01/19/2021 Resolved Problems ICD-10 Code Description Active Date Resolved Date S40.812D Abrasion of left upper arm, subsequent encounter 01/19/2021 01/19/2021 Electronic Signature(s) Signed: 03/15/2021 8:57:24 AM By: Kalman Shan DO Entered By: Kalman Shan on 03/15/2021 08:50:49 -------------------------------------------------------------------------------- Progress Note Details Patient Name: Date of Service: Richard [name], Pricilla [name] [name] 03/15/2021 9:00 A M Medical Record Number: [id number] Patient Account Number: [id number] Date of Birth/Sex: Treating RN: 02-Jun-1941 (80 y.o. Erie Noe Primary Care Provider: Roma Schanz Other Clinician: Referring Provider: Treating Provider/Extender: Tharon Aquas in Treatment: 8 Subjective Chief Complaint Information obtained from Patient Wounds to his left upper extremity and bilateral lower extremities. History of Present Illness (HPI) Admission 7/15 Mr. Cayde Ohlrich is an 80 year old male with a past medical history of type 2 diabetes on oral agents, diffuse large B-cell lymphoma, and coronary artery disease status post CABG that presents to the clinic for wounds to his left upper extremity and bilateral lower extremities. He states that he went to urgent care to be evaluated for a fever. And he was instructed to go to the ED to be further evaluated for  possible sepsis. Unfortunately he passed out in his car After his appointment and was not discovered for another 3 hours. He was eventually discovered and EMS had to take him out of his vehicle. This is when he developed abrasions to his left arm and legs. He has been using Xeroform to his wound beds every other day. He reports improvement to all the wounds except for the one on the back of his right leg. He denies pain or signs of infection. 7/22; patient presents for 1 week follow-up. He has no issues or complaints today. He has tolerated  the compression wrap well on his right lower extremity. He has been able to do the dressing changes without issues to his left leg and left arm. His left arm wound has healed. He denies signs of infection. 7/29; patient presents for 1 week follow-up. He reports that his left leg wound is healed. He tolerated the compression wrap well on his right leg. He has no issues or complaints today. He denies signs of infection. 8/4; patient presents for 1 week follow-up. He continues to see improvement in wound healing to his right lower extremity. He has no issues or complaints today. He denies signs of infection. He does state that he went to the dermatologist and had some lesions removed on his face and left leg. He has been keeping these covered with a Band-Aid. 8/19; patient presents for follow-up. He is tolerated the compression wrap well. He has no issues or complaints today. He now only has 1 remaining wound. 9/1; patient presents for 1 week follow-up. He has tolerated the compression wrap well and has no issues or complaints today. He has been golfing 3 times weekly. He is in good spirits today. 9/15; patient presents for follow-up. He has no issues or complaints today. The wound is closed. Patient History Information obtained from Patient. Family History Cancer - Mother, Diabetes - Maternal Grandparents, Heart Disease - Paternal Grandparents, Hypertension -  Paternal Grandparents, Seizures - Child, Stroke - Father, No family history of Hereditary Spherocytosis, Kidney Disease, Lung Disease, Thyroid Problems, Tuberculosis. Social History Never smoker, Marital Status - Divorced, Alcohol Use - Never, Drug Use - No History, Caffeine Use - Rarely. Medical History Eyes Patient has history of Cataracts - Surgery Hematologic/Lymphatic Patient has history of Anemia Respiratory Patient has history of Sleep Apnea Cardiovascular Patient has history of Arrhythmia, Congestive Heart Failure, Coronary Artery Disease, Hypertension Endocrine Patient has history of Type II Diabetes Musculoskeletal Patient has history of Osteoarthritis Neurologic Patient has history of Neuropathy Oncologic Patient has history of Received Chemotherapy Denies history of Received Radiation Medical A Surgical History Notes nd Musculoskeletal Spinal Stenosis Oncologic Large B Cell Lymphoma Objective Constitutional respirations regular, non-labored and within target range for patient.. Vitals Time Taken: 8:39 AM, Height: 72 in, Weight: 235 lbs, BMI: 31.9, Temperature: 98.1 F, Pulse: 76 bpm, Respiratory Rate: 17 breaths/min, Blood Pressure: 123/70 mmHg, Capillary Blood Glucose: 123 mg/dl. Psychiatric pleasant and cooperative. General Notes: Right lower extremity: T the posterior aspect there is epithelialization to previous wound site. No signs of infection. No surrounding skin o breakdown. Integumentary (Hair, Skin) Wound #2 status is Healed - Epithelialized. Original cause of wound was Trauma. The date acquired was: 12/24/2020. The wound has been in treatment 8 weeks. The wound is located on the Right,Posterior Lower Leg. The wound measures 0cm length x 0cm width x 0cm depth; 0cm^2 area and 0cm^3 volume. There is Fat Layer (Subcutaneous Tissue) exposed. There is a medium amount of serosanguineous drainage noted. The wound margin is distinct with the outline attached  to the wound base. There is medium (34-66%) red, pink granulation within the wound bed. There is a medium (34-66%) amount of necrotic tissue within the wound bed. Assessment Active Problems ICD-10 Unspecified open wound, right lower leg, subsequent encounter Patient has done very well with Hydrofera Blue and compression wrap. His wound is healed today. He can follow-up as needed. Plan Discharge From [facility] Services: Discharge from [place] 1. Discharge from clinic due to closed wound 2. Follow-up as needed Electronic Signature(s) Signed: 03/15/2021 8:57:24  AM By: Kalman Shan DO Entered By: Kalman Shan on 03/15/2021 08:56:50 -------------------------------------------------------------------------------- HxROS Details Patient Name: Date of Service: Richard [name] [name] 03/15/2021 9:00 A M Medical Record Number: [id number] Patient Account Number: [id number] Date of Birth/Sex: Treating RN: December 14, 1940 (80 y.o. Richard [name], [name] Primary Care Provider: Roma Schanz Other Clinician: Referring Provider: Treating Provider/Extender: Tharon Aquas in Treatment: 8 Information Obtained From Patient Eyes Medical History: Positive for: Cataracts - Surgery Hematologic/Lymphatic Medical History: Positive for: Anemia Respiratory Medical History: Positive for: Sleep Apnea Cardiovascular Medical History: Positive for: Arrhythmia; Congestive Heart Failure; Coronary Artery Disease; Hypertension Endocrine Medical History: Positive for: Type II Diabetes Time with diabetes: 20 years Treated with: Oral agents Blood sugar tested every day: Yes Tested : Q am Musculoskeletal Medical History: Positive for: Osteoarthritis Past Medical History Notes: Spinal Stenosis Neurologic Medical History: Positive for: Neuropathy Oncologic Medical History: Positive for: Received Chemotherapy Negative for: Received Radiation Past Medical History  Notes: Large B Cell Lymphoma HBO Extended History Items Eyes: Cataracts Immunizations Pneumococcal Vaccine: Received Pneumococcal Vaccination: Yes Received Pneumococcal Vaccination On or After 60th Birthday: No Implantable Devices Yes Family and Social History Cancer: Yes - Mother; Diabetes: Yes - Maternal Grandparents; Heart Disease: Yes - Paternal Grandparents; Hereditary Spherocytosis: No; Hypertension: Yes - Paternal Grandparents; Kidney Disease: No; Lung Disease: No; Seizures: Yes - Child; Stroke: Yes - Father; Thyroid Problems: No; Tuberculosis: No; Never smoker; Marital Status - Divorced; Alcohol Use: Never; Drug Use: No History; Caffeine Use: Rarely; Financial Concerns: No; Food, Clothing or Shelter Needs: No; Support System Lacking: No; Transportation Concerns: No Electronic Signature(s) Signed: 03/15/2021 8:57:24 AM By: Kalman Shan DO Signed: 03/15/2021 4:34:42 PM By: Rhae Hammock RN Entered By: Kalman Shan on 03/15/2021 08:51:39 -------------------------------------------------------------------------------- SuperBill Details Patient Name: Date of Service: Richard [name], [place]. 03/15/2021 Medical Record Number: [id number] Patient Account Number: [id number] Date of Birth/Sex: Treating RN: Oct 19, 1940 (80 y.o. Erie Noe Primary Care Provider: Roma Schanz Other Clinician: Referring Provider: Treating Provider/Extender: Tharon Aquas in Treatment: 8 Diagnosis Coding ICD-10 Codes Code Description [phone] Unspecified open wound, right lower leg, subsequent encounter Facility Procedures Physician Procedures : CPT4 Code Description Modifier [id number] - WC PHYS LEVEL 3 - EST PT ICD-10 Diagnosis Description S81.801D Unspecified open wound, right lower leg, subsequent encounter Quantity: 1 Electronic Signature(s) Signed: 03/15/2021 8:57:24 AM By: Kalman Shan DO Entered By: Kalman Shan on 03/15/2021  08:57:00

## 2021-03-16 ENCOUNTER — Ambulatory Visit (HOSPITAL_COMMUNITY)
Admission: RE | Admit: 2021-03-16 | Discharge: 2021-03-16 | Disposition: A | Discharge status: Home / Self Care | Payer: Medicare Other | Attending: Vascular Surgery | Admitting: Vascular Surgery

## 2021-03-16 ENCOUNTER — Encounter (HOSPITAL_COMMUNITY)
Admission: RE | Disposition: A | Discharge status: Home / Self Care | Payer: Self-pay | Source: Home / Self Care | Attending: Vascular Surgery

## 2021-03-16 DIAGNOSIS — Z950 Presence of cardiac pacemaker: Secondary | ICD-10-CM | POA: Diagnosis not present

## 2021-03-16 DIAGNOSIS — Z7901 Long term (current) use of anticoagulants: Secondary | ICD-10-CM | POA: Insufficient documentation

## 2021-03-16 DIAGNOSIS — M7989 Other specified soft tissue disorders: Secondary | ICD-10-CM | POA: Diagnosis not present

## 2021-03-16 DIAGNOSIS — Z888 Allergy status to other drugs, medicaments and biological substances status: Secondary | ICD-10-CM | POA: Diagnosis not present

## 2021-03-16 DIAGNOSIS — Z79899 Other long term (current) drug therapy: Secondary | ICD-10-CM | POA: Insufficient documentation

## 2021-03-16 DIAGNOSIS — I871 Compression of vein: Secondary | ICD-10-CM | POA: Insufficient documentation

## 2021-03-16 DIAGNOSIS — Z7984 Long term (current) use of oral hypoglycemic drugs: Secondary | ICD-10-CM | POA: Diagnosis not present

## 2021-03-16 DIAGNOSIS — Z886 Allergy status to analgesic agent status: Secondary | ICD-10-CM | POA: Diagnosis not present

## 2021-03-16 DIAGNOSIS — Z91011 Allergy to milk products: Secondary | ICD-10-CM | POA: Insufficient documentation

## 2021-03-16 HISTORY — PX: UPPER EXTREMITY VENOGRAPHY: CATH118272

## 2021-03-16 HISTORY — PX: PERIPHERAL VASCULAR BALLOON ANGIOPLASTY: CATH118281

## 2021-03-16 LAB — GLUCOSE, CAPILLARY: Glucose-Capillary: 150 mg/dL — ABNORMAL HIGH (ref 70–99)

## 2021-03-16 LAB — POCT I-STAT, CHEM 8
BUN: 27 mg/dL — ABNORMAL HIGH (ref 8–23)
Calcium, Ion: 1.27 mmol/L (ref 1.15–1.40)
Chloride: 104 mmol/L (ref 98–111)
Creatinine, Ser: 1.4 mg/dL — ABNORMAL HIGH (ref 0.61–1.24)
Glucose, Bld: 149 mg/dL — ABNORMAL HIGH (ref 70–99)
HCT: 38 % — ABNORMAL LOW (ref 39.0–52.0)
Hemoglobin: 12.9 g/dL — ABNORMAL LOW (ref 13.0–17.0)
Potassium: 4.3 mmol/L (ref 3.5–5.1)
Sodium: 139 mmol/L (ref 135–145)
TCO2: 26 mmol/L (ref 22–32)

## 2021-03-16 SURGERY — UPPER EXTREMITY VENOGRAPHY
Anesthesia: LOCAL

## 2021-03-16 MED ORDER — HEPARIN SODIUM (PORCINE) 1000 UNIT/ML IJ SOLN
INTRAMUSCULAR | Status: AC
  Filled 2021-03-16: qty 1

## 2021-03-16 MED ORDER — IODIXANOL 320 MG/ML IV SOLN
INTRAVENOUS | Status: DC | PRN
  Administered 2021-03-16: 55 mL

## 2021-03-16 MED ORDER — SODIUM CHLORIDE 0.9 % IV SOLN: INTRAVENOUS | Status: DC

## 2021-03-16 MED ORDER — LIDOCAINE HCL (PF) 1 % IJ SOLN
INTRAMUSCULAR | Status: AC
  Filled 2021-03-16: qty 30

## 2021-03-16 MED ORDER — HEPARIN (PORCINE) IN NACL 1000-0.9 UT/500ML-% IV SOLN
INTRAVENOUS | Status: DC | PRN
  Administered 2021-03-16: 500 mL

## 2021-03-16 MED ORDER — LIDOCAINE HCL (PF) 1 % IJ SOLN
INTRAMUSCULAR | Status: DC | PRN
  Administered 2021-03-16: 2 mL via INTRADERMAL

## 2021-03-16 MED ORDER — HEPARIN SODIUM (PORCINE) 1000 UNIT/ML IJ SOLN
INTRAMUSCULAR | Status: DC | PRN
  Administered 2021-03-16: 5000 [IU] via INTRAVENOUS

## 2021-03-16 MED ORDER — HEPARIN (PORCINE) IN NACL 1000-0.9 UT/500ML-% IV SOLN
INTRAVENOUS | Status: AC
  Filled 2021-03-16: qty 500

## 2021-03-16 SURGICAL SUPPLY — 16 items
BALLN ATHLETIS 12X40X75 (BALLOONS) ×3
BALLN MUSTANG 10.0X40 75 (BALLOONS) ×3
BALLN MUSTANG 8X60X75 (BALLOONS) ×3
BALLOON ATHLETIS 12X40X75 (BALLOONS) IMPLANT
BALLOON MUSTANG 10.0X40 75 (BALLOONS) IMPLANT
BALLOON MUSTANG 8X60X75 (BALLOONS) IMPLANT
CATH ANGIO 5F BER2 65CM (CATHETERS) ×1 IMPLANT
KIT ENCORE 26 ADVANTAGE (KITS) ×1 IMPLANT
KIT MICROPUNCTURE NIT STIFF (SHEATH) ×1 IMPLANT
KIT PV (KITS) ×3 IMPLANT
SHEATH PINNACLE 8F 10CM (SHEATH) ×1 IMPLANT
SHEATH PINNACLE R/O II 6F 4CM (SHEATH) ×1 IMPLANT
SHEATH PROBE COVER 6X72 (BAG) ×1 IMPLANT
TRANSDUCER W/STOPCOCK (MISCELLANEOUS) ×3 IMPLANT
TRAY PV CATH (CUSTOM PROCEDURE TRAY) ×3 IMPLANT
WIRE BENTSON .035X145CM (WIRE) ×1 IMPLANT

## 2021-03-16 NOTE — Progress Notes (Signed)
Client advised to hold metformin until Monday and voiced understanding

## 2021-03-16 NOTE — Interval H&P Note (Signed)
History and Physical Interval Note:  03/16/2021 1:23 PM  Richard [name]  has presented today for surgery, with the diagnosis of swelling left arm.  The various methods of treatment have been discussed with the patient and family. After consideration of risks, benefits and other options for treatment, the patient has consented to  Procedure(s): UPPER EXTREMITY VENOGRAPHY (N/A) as a surgical intervention.  The patient's history has been reviewed, patient examined, no change in status, stable for surgery.  I have reviewed the patient's chart and labs.  Questions were answered to the patient's satisfaction.     [name]

## 2021-03-16 NOTE — Op Note (Signed)
DATE OF SERVICE: 03/16/2021  PATIENT:  Richard [name]  80 y.o. male  PRE-OPERATIVE DIAGNOSIS:  left subclavian vein stenosis about pacemaker wires  POST-OPERATIVE DIAGNOSIS:  Same  PROCEDURE:   1) US guided left basilic vein access 2) Left upper extremity and central venogram (37mL total contrast) 3) Left subclavian vein angioplasty (12x70mm Stelaris)  SURGEON:  [name]. Stanford Breed, MD  ASSISTANT: none  ANESTHESIA:   local  ESTIMATED BLOOD LOSS: min  LOCAL MEDICATIONS USED:  LIDOCAINE   COUNTS: confirmed correct .  PATIENT DISPOSITION:  PACU - hemodynamically stable.   Delay start of Pharmacological VTE agent (>24hrs) due to surgical blood loss or risk of bleeding: no  INDICATION FOR PROCEDURE: Richard [name] is a 80 y.o. male with symptomatic left arm swelling with subclavian stenosis about pacemaker wire insertion. After careful discussion of risks, benefits, and alternatives the patient was offered angioplasty. The patient understood and wished to proceed.  OPERATIVE FINDINGS:  Critical stenosis of subclavian vein about the pacemaker wires. 20% residual stenosis after angioplasty  DESCRIPTION OF PROCEDURE: After identification of the patient in the pre-operative holding area, the patient was transferred to the operating room. The patient was positioned supine on the operating room table. The groins was prepped and draped in standard fashion. A surgical pause was performed confirming correct patient, procedure, and operative location.  The left medial arm was anesthetized with subcutaneous injection of 1% lidocaine. Using ultrasound guidance, the left basilic vein was accessed with micropuncture technique. Fluoroscopy was used to confirm cannulation over the femoral head. The 262F sheath was upsized to 62F.   A Benson wire was advanced into superior vena cava with a Bernstein catheter support.  The decision was made to intervene. The patient was heparinized with 5,000 units of  heparin. Selective angiography of the left lower extremity was performed prior to intervention. The lesions were treated with angioplasty (12x6mm Stelaris).  A figure of 8 monocryl suture was placed around the access site. Manual pressure was held to the access site for several minutes. Hemostasis was good.   Upon completion of the case instrument and sharps counts were confirmed correct. The patient was transferred to the PACU in good condition. I was present for all portions of the procedure.  PLAN: good technical result from angioplasty. Follow up with me in 1-2 weeks.   [name]. Stanford Breed, MD [facility] Phone Number: [phone] 03/16/2021 1:23 PM

## 2021-03-19 ENCOUNTER — Encounter (HOSPITAL_COMMUNITY): Payer: Self-pay | Admitting: Vascular Surgery

## 2021-03-21 ENCOUNTER — Other Ambulatory Visit: Payer: Self-pay

## 2021-03-21 ENCOUNTER — Ambulatory Visit (INDEPENDENT_AMBULATORY_CARE_PROVIDER_SITE_OTHER): Payer: Medicare Other | Admitting: Podiatry

## 2021-03-21 ENCOUNTER — Encounter: Payer: Self-pay | Admitting: Podiatry

## 2021-03-21 DIAGNOSIS — M79675 Pain in left toe(s): Secondary | ICD-10-CM

## 2021-03-21 DIAGNOSIS — B351 Tinea unguium: Secondary | ICD-10-CM | POA: Diagnosis not present

## 2021-03-21 DIAGNOSIS — E1142 Type 2 diabetes mellitus with diabetic polyneuropathy: Secondary | ICD-10-CM

## 2021-03-21 DIAGNOSIS — M79674 Pain in right toe(s): Secondary | ICD-10-CM

## 2021-03-21 NOTE — Progress Notes (Signed)
This patient returns to my office for at risk foot care.  This patient requires this care by a professional since this patient will be at risk due to having diabetic neuropathy  and coagulation defect.  Patient is taking eliquis. This patient is unable to cut nails himself since the patient cannot reach his nails.These nails are painful walking and wearing shoes.  Patient says the callus on his right forefoot has become painful when walking.  This patient presents for at risk foot care today. ° °General Appearance  Alert, conversant and in no acute stress. ° °Vascular  Dorsalis pedis and posterior tibial  pulses are palpable  bilaterally.  Capillary return is within normal limits  bilaterally. Temperature is within normal limits  bilaterally. ° °Neurologic  Senn-Weinstein monofilament wire test within normal limits  Left foot.  LOPS right foot is absent.  . Muscle power within normal limits bilaterally. ° °Nails Thick disfigured discolored nails with subungual debris  from hallux to fifth toes bilaterally. No evidence of bacterial infection or drainage bilaterally. ° °Orthopedic  No limitations of motion  feet .  No crepitus or effusions noted.  No bony pathology or digital deformities noted.  Plantar flexed fifth metatarsal right foot. ° °Skin  normotropic skin with noted bilaterally.  No signs of infections or ulcers noted ° °Onychomycosis  Pain in right toes  Pain in left toes  Porokeratosis sub 5th right ° °Consent was obtained for treatment procedures.   Mechanical debridement of nails 1-5  bilaterally performed with a nail nipper.  Filed with dremel without incident.  ° ° °Return office visit   12 weeks.                  Told patient to return for periodic foot care and evaluation due to potential at risk complications. ° ° °Dennies Coate DPM  °

## 2021-03-27 ENCOUNTER — Inpatient Hospital Stay: Payer: Medicare Other | Attending: Hematology & Oncology

## 2021-03-27 ENCOUNTER — Encounter: Payer: Self-pay | Admitting: Family

## 2021-03-27 ENCOUNTER — Other Ambulatory Visit: Payer: Self-pay

## 2021-03-27 ENCOUNTER — Inpatient Hospital Stay: Payer: Medicare Other

## 2021-03-27 ENCOUNTER — Inpatient Hospital Stay (HOSPITAL_BASED_OUTPATIENT_CLINIC_OR_DEPARTMENT_OTHER): Payer: Medicare Other | Admitting: Family

## 2021-03-27 VITALS — BP 131/59 | HR 76 | Temp 98.1°F | Resp 16

## 2021-03-27 VITALS — BP 131/59 | HR 76 | Temp 98.2°F | Resp 18 | Ht 72.0 in | Wt 248.1 lb

## 2021-03-27 DIAGNOSIS — T451X5D Adverse effect of antineoplastic and immunosuppressive drugs, subsequent encounter: Secondary | ICD-10-CM | POA: Insufficient documentation

## 2021-03-27 DIAGNOSIS — D510 Vitamin B12 deficiency anemia due to intrinsic factor deficiency: Secondary | ICD-10-CM | POA: Diagnosis not present

## 2021-03-27 DIAGNOSIS — D508 Other iron deficiency anemias: Secondary | ICD-10-CM

## 2021-03-27 DIAGNOSIS — G620 Drug-induced polyneuropathy: Secondary | ICD-10-CM | POA: Diagnosis not present

## 2021-03-27 DIAGNOSIS — M7989 Other specified soft tissue disorders: Secondary | ICD-10-CM | POA: Insufficient documentation

## 2021-03-27 DIAGNOSIS — D519 Vitamin B12 deficiency anemia, unspecified: Secondary | ICD-10-CM

## 2021-03-27 DIAGNOSIS — Z95828 Presence of other vascular implants and grafts: Secondary | ICD-10-CM

## 2021-03-27 DIAGNOSIS — D500 Iron deficiency anemia secondary to blood loss (chronic): Secondary | ICD-10-CM

## 2021-03-27 DIAGNOSIS — C8338 Diffuse large B-cell lymphoma, lymph nodes of multiple sites: Secondary | ICD-10-CM | POA: Diagnosis not present

## 2021-03-27 LAB — CBC WITH DIFFERENTIAL (CANCER CENTER ONLY)
Abs Immature Granulocytes: 0.11 10*3/uL — ABNORMAL HIGH (ref 0.00–0.07)
Basophils Absolute: 0.1 10*3/uL (ref 0.0–0.1)
Basophils Relative: 1 %
Eosinophils Absolute: 0.1 10*3/uL (ref 0.0–0.5)
Eosinophils Relative: 1 %
HCT: 39.3 % (ref 39.0–52.0)
Hemoglobin: 12.8 g/dL — ABNORMAL LOW (ref 13.0–17.0)
Immature Granulocytes: 1 %
Lymphocytes Relative: 19 %
Lymphs Abs: 1.6 10*3/uL (ref 0.7–4.0)
MCH: 29.6 pg (ref 26.0–34.0)
MCHC: 32.6 g/dL (ref 30.0–36.0)
MCV: 90.8 fL (ref 80.0–100.0)
Monocytes Absolute: 0.6 10*3/uL (ref 0.1–1.0)
Monocytes Relative: 7 %
Neutro Abs: 6.1 10*3/uL (ref 1.7–7.7)
Neutrophils Relative %: 71 %
Platelet Count: 195 10*3/uL (ref 150–400)
RBC: 4.33 MIL/uL (ref 4.22–5.81)
RDW: 16.2 % — ABNORMAL HIGH (ref 11.5–15.5)
WBC Count: 8.7 10*3/uL (ref 4.0–10.5)
nRBC: 0 % (ref 0.0–0.2)

## 2021-03-27 LAB — CMP (CANCER CENTER ONLY)
ALT: 16 U/L (ref 0–44)
AST: 15 U/L (ref 15–41)
Albumin: 4.5 g/dL (ref 3.5–5.0)
Alkaline Phosphatase: 54 U/L (ref 38–126)
Anion gap: 11 (ref 5–15)
BUN: 28 mg/dL — ABNORMAL HIGH (ref 8–23)
CO2: 27 mmol/L (ref 22–32)
Calcium: 9.7 mg/dL (ref 8.9–10.3)
Chloride: 104 mmol/L (ref 98–111)
Creatinine: 1.44 mg/dL — ABNORMAL HIGH (ref 0.61–1.24)
GFR, Estimated: 49 mL/min — ABNORMAL LOW (ref >60)
Glucose, Bld: 96 mg/dL (ref 70–99)
Potassium: 4.3 mmol/L (ref 3.5–5.1)
Sodium: 142 mmol/L (ref 135–145)
Total Bilirubin: 0.6 mg/dL (ref 0.3–1.2)
Total Protein: 6.8 g/dL (ref 6.5–8.1)

## 2021-03-27 LAB — RETICULOCYTES
Immature Retic Fract: 27.5 % — ABNORMAL HIGH (ref 2.3–15.9)
RBC.: 4.32 MIL/uL (ref 4.22–5.81)
Retic Count, Absolute: 74.7 10*3/uL (ref 19.0–186.0)
Retic Ct Pct: 1.7 % (ref 0.4–3.1)

## 2021-03-27 LAB — VITAMIN B12: Vitamin B-12: 158 pg/mL — ABNORMAL LOW (ref 180–914)

## 2021-03-27 LAB — LACTATE DEHYDROGENASE: LDH: 180 U/L (ref 98–192)

## 2021-03-27 MED ORDER — HEPARIN SOD (PORK) LOCK FLUSH 100 UNIT/ML IV SOLN
500.0000 [IU] | Freq: Once | INTRAVENOUS | Status: AC
  Administered 2021-03-27: 500 [IU] via INTRAVENOUS

## 2021-03-27 MED ORDER — SODIUM CHLORIDE 0.9% FLUSH
10.0000 mL | Freq: Once | INTRAVENOUS | Status: AC
  Administered 2021-03-27: 10 mL via INTRAVENOUS

## 2021-03-27 MED ORDER — DENOSUMAB 120 MG/1.7ML ~~LOC~~ SOLN
120.0000 mg | Freq: Once | SUBCUTANEOUS | Status: AC
  Administered 2021-03-27: 120 mg via SUBCUTANEOUS
  Filled 2021-03-27: qty 1.7

## 2021-03-27 MED ORDER — CYANOCOBALAMIN 1000 MCG/ML IJ SOLN
1000.0000 ug | Freq: Once | INTRAMUSCULAR | Status: AC
  Administered 2021-03-27: 1000 ug via INTRAMUSCULAR
  Filled 2021-03-27: qty 1

## 2021-03-27 NOTE — Patient Instructions (Signed)
Implanted Port Home Guide An implanted port is a device that is placed under the skin. It is usually placed in the chest. The device can be used to give IV medicine, to take blood, or for dialysis. You may have an implanted port if: You need IV medicine that would be irritating to the small veins in your hands or arms. You need IV medicines, such as antibiotics, for a long period of time. You need IV nutrition for a long period of time. You need dialysis. When you have a port, your health care provider can choose to use the port instead of veins in your arms for these procedures. You may have fewer limitations when using a port than you would if you used other types of long-term IVs, and you will likely be able to return to normal activities after your incision heals. An implanted port has two main parts: Reservoir. The reservoir is the part where a needle is inserted to give medicines or draw blood. The reservoir is round. After it is placed, it appears as a small, raised area under your skin. Catheter. The catheter is a thin, flexible tube that connects the reservoir to a vein. Medicine that is inserted into the reservoir goes into the catheter and then into the vein. How is my port accessed? To access your port: A numbing cream may be placed on the skin over the port site. Your health care provider will put on a mask and sterile gloves. The skin over your port will be cleaned carefully with a germ-killing soap and allowed to dry. Your health care provider will gently pinch the port and insert a needle into it. Your health care provider will check for a blood return to make sure the port is in the vein and is not clogged. If your port needs to remain accessed to get medicine continuously (constant infusion), your health care provider will place a clear bandage (dressing) over the needle site. The dressing and needle will need to be changed every week, or as told by your health care provider. What  is flushing? Flushing helps keep the port from getting clogged. Follow instructions from your health care provider about how and when to flush the port. Ports are usually flushed with saline solution or a medicine called heparin. The need for flushing will depend on how the port is used: If the port is only used from time to time to give medicines or draw blood, the port may need to be flushed: Before and after medicines have been given. Before and after blood has been drawn. As part of routine maintenance. Flushing may be recommended every 4-6 weeks. If a constant infusion is running, the port may not need to be flushed. Throw away any syringes in a disposal container that is meant for sharp items (sharps container). You can buy a sharps container from a pharmacy, or you can make one by using an empty hard plastic bottle with a cover. How long will my port stay implanted? The port can stay in for as long as your health care provider thinks it is needed. When it is time for the port to come out, a surgery will be done to remove it. The surgery will be similar to the procedure that was done to put the port in. Follow these instructions at home:  Flush your port as told by your health care provider. If you need an infusion over several days, follow instructions from your health care provider about how   to take care of your port site. Make sure you: Wash your hands with soap and water before you change your dressing. If soap and water are not available, use alcohol-based hand sanitizer. Change your dressing as told by your health care provider. Place any used dressings or infusion bags into a plastic bag. Throw that bag in the trash. Keep the dressing that covers the needle clean and dry. Do not get it wet. Do not use scissors or sharp objects near the tube. Keep the tube clamped, unless it is being used. Check your port site every day for signs of infection. Check for: Redness, swelling, or  pain. Fluid or blood. Pus or a bad smell. Protect the skin around the port site. Avoid wearing bra straps that rub or irritate the site. Protect the skin around your port from seat belts. Place a soft pad over your chest if needed. Bathe or shower as told by your health care provider. The site may get wet as long as you are not actively receiving an infusion. Return to your normal activities as told by your health care provider. Ask your health care provider what activities are safe for you. Carry a medical alert card or wear a medical alert bracelet at all times. This will let health care providers know that you have an implanted port in case of an emergency. Get help right away if: You have redness, swelling, or pain at the port site. You have fluid or blood coming from your port site. You have pus or a bad smell coming from the port site. You have a fever. Summary Implanted ports are usually placed in the chest for long-term IV access. Follow instructions from your health care provider about flushing the port and changing bandages (dressings). Take care of the area around your port by avoiding clothing that puts pressure on the area, and by watching for signs of infection. Protect the skin around your port from seat belts. Place a soft pad over your chest if needed. Get help right away if you have a fever or you have redness, swelling, pain, drainage, or a bad smell at the port site. This information is not intended to replace advice given to you by your health care provider. Make sure you discuss any questions you have with your health care provider. Document Revised: 09/06/2020 Document Reviewed: 11/01/2019 Elsevier Patient Education  2022 [employer]  

## 2021-03-27 NOTE — Progress Notes (Signed)
Hematology and Oncology Follow Up Visit  Richard [name] [id number] 19-Dec-1940 80 y.o. 03/27/2021   Principle Diagnosis:  Diffuse large cell non-Hodgkin's lymphoma (IPI = 3) - NOT "double hit" Pernicious anemia Iron deficiency secondary to bleeding   Past Therapy: R-[facility] - s/p cycle 8 - completed 08/2017   Current Therapy:        Vitamin B12 1 mg IM every month Xgeva 120 mg subcu q 3 months - next dose due in 05/2021 IV iron as indicated    Interim History:  Richard [name] is here today for follow-up and B12 with Xgeva. He is doing fairly well and was able to have a stent placed to help reduce the swelling in the left arm 1 weeks ago. He had improvement for 1 day and then the swelling returned. He has his post of follow-up this week and plans to discuss with them at that time.  Radial pulses 3+. Pedal pulses 2+.  Neuropathy in his lower extremities is stable/unchanged from baseline.  He was able to start golfing again yesterday.  No fever, chills, n/v, cough, rash, dizziness, SOB, chest pain, palpitations, abdominal pain or changes in bowel or bladder habits.  No falls or syncope to report.  He is eating well and staying hydrated throughout the day.  His weight is 248 lbs.   ECOG Performance Status: 1 - Symptomatic but completely ambulatory  Medications:  Allergies as of 03/27/2021       Reactions   Benazepril Swelling   angioedema; he is not a candidate for any angiotensin receptor blockers because of this significant allergic reaction. Because of a history of documented adverse serious drug reaction;Medi Alert bracelet  is recommended   Hctz [hydrochlorothiazide] Anaphylaxis, Swelling   Tongue and lip swelling   Aspirin Other (See Comments)   Gastritis, cant take 325 Mg aspirin    Lactose Intolerance (gi) Nausea And Vomiting        Medication List        Accurate as of March 27, 2021  2:06 PM. If you have any questions, ask your nurse or doctor.           acetaminophen 325 MG tablet Commonly known as: TYLENOL Take 650 mg by mouth at bedtime as needed for moderate pain or headache.   allopurinol 300 MG tablet Commonly known as: ZYLOPRIM Take 450 mg by mouth daily.   aluminum hydroxide-magnesium carbonate 95-358 MG/15ML Susp Commonly known as: GAVISCON Take 15 mLs by mouth as needed for indigestion or heartburn.   amLODipine 5 MG tablet Commonly known as: NORVASC TAKE 1 TABLET BY MOUTH TWICE A DAY   apixaban 5 MG Tabs tablet Commonly known as: ELIQUIS Take 1 tablet (5 mg total) by mouth 2 (two) times daily.   atorvastatin 80 MG tablet Commonly known as: LIPITOR TAKE ONE TABLET BY MOUTH AT BEDTIME   azelastine 0.1 % nasal spray Commonly known as: ASTELIN Place 2 sprays into both nostrils at bedtime as needed for rhinitis or allergies. What changed: when to take this   dapagliflozin propanediol 10 MG Tabs tablet Commonly known as: FARXIGA Take 10 mg by mouth daily.   esomeprazole 40 MG capsule Commonly known as: NexIUM Take 1 capsule (40 mg total) by mouth daily.   eye wash Soln Place 1 drop into both eyes at bedtime. [facility] Soothing Eye Wash   ezetimibe 10 MG tablet Commonly known as: ZETIA TAKE 1 TABLET BY MOUTH EVERY DAY   fenofibrate 160 MG tablet TAKE 1 TABLET  BY MOUTH EVERY DAY   folic acid 1 MG tablet Commonly known as: FOLVITE TAKE 2 TABLETS BY MOUTH EVERY DAY   freestyle lancets USE TWICE A DAY TO CHECK BLOOD SUGAR. DX E11.9   FREESTYLE LITE test strip Generic drug: glucose blood USE TO TEST BLOOD SUGAR ONCE A DAY. DX CODE: E11.9   glimepiride 1 MG tablet Commonly known as: AMARYL TAKE 1 TABLET BY MOUTH DAILY WITH BREAKFAST.   ICY HOT ADVANCED PAIN RELIEF EX Apply 1 application topically daily. Roll-on   metFORMIN 1000 MG tablet Commonly known as: GLUCOPHAGE TAKE 1 & 1/2 TABLETS BY MOUTH EVERY MORNING AND 1 TABLET IN THE EVENING. What changed: See the new instructions.   metoprolol  tartrate 25 MG tablet Commonly known as: LOPRESSOR TAKE ONE-HALF TABLET BY MOUTH TWICE DAILY   nitroGLYCERIN 0.4 MG SL tablet Commonly known as: NITROSTAT PLACE 1 TABLET (0.4 MG TOTAL) UNDER THE TONGUE EVERY 5 (FIVE) MINUTES AS NEEDED FOR CHEST PAIN.   polycarbophil 625 MG tablet Commonly known as: FIBERCON Take 625 mg by mouth daily as needed for moderate constipation.   potassium chloride SA 20 MEQ tablet Commonly known as: Klor-Con M20 Take 2 tablets (40 mEq total) by mouth 2 (two) times daily.   pregabalin 150 MG capsule Commonly known as: LYRICA TAKE 1 CAPSULE BY MOUTH TWICE A DAY   psyllium 58.6 % powder Commonly known as: METAMUCIL Take 1 packet by mouth daily as needed (constipation).   torsemide 20 MG tablet Commonly known as: DEMADEX Take 20 mg by mouth daily.   Vascepa 1 g capsule Generic drug: icosapent Ethyl TAKE 2 CAPSULES BY MOUTH TWICE A DAY        Allergies:  Allergies  Allergen Reactions   Benazepril Swelling    angioedema; he is not a candidate for any angiotensin receptor blockers because of this significant allergic reaction. Because of a history of documented adverse serious drug reaction;Medi Alert bracelet  is recommended   Hctz [Hydrochlorothiazide] Anaphylaxis and Swelling    Tongue and lip swelling    Aspirin Other (See Comments)    Gastritis, cant take 325 Mg aspirin    Lactose Intolerance (Gi) Nausea And Vomiting    Past Medical History, Surgical history, Social history, and Family History were reviewed and updated.  Review of Systems: All other 10 point review of systems is negative.   Physical Exam:  vitals were not taken for this visit.   Wt Readings from Last 3 Encounters:  03/16/21 242 lb (109.8 kg)  03/13/21 247 lb (112 kg)  03/08/21 244 lb (110.7 kg)    Ocular: Sclerae unicteric, pupils equal, round and reactive to light Ear-nose-throat: Oropharynx clear, dentition fair Lymphatic: No cervical or supraclavicular  adenopathy Lungs no rales or rhonchi, good excursion bilaterally Heart regular rate and rhythm, no murmur appreciated Abd soft, nontender, positive bowel sounds MSK no focal spinal tenderness, no joint edema Neuro: non-focal, well-oriented, appropriate affect Breasts: Deferred   Lab Results  Component Value Date   WBC 8.7 03/27/2021   HGB 12.8 (L) 03/27/2021   HCT 39.3 03/27/2021   MCV 90.8 03/27/2021   PLT 195 03/27/2021   Lab Results  Component Value Date   FERRITIN 1,762 (H) 02/19/2021   IRON 67 02/19/2021   TIBC 338 02/19/2021   UIBC 271 02/19/2021   IRONPCTSAT 20 02/19/2021   Lab Results  Component Value Date   RETICCTPCT 1.7 03/27/2021   RBC 4.33 03/27/2021   RBC 4.32 03/27/2021   RETICCTABS  52.1 11/22/2011   No results found for: Nils Pyle [facility] Lab Results  Component Value Date   IGA 159 03/09/2012   Lab Results  Component Value Date   ALBUMINELP 4.2 07/27/2018   MSPIKE Not Observed 07/27/2018     Chemistry      Component Value Date/Time   NA 139 03/16/2021 1104   NA 136 03/16/2019 0938   NA 144 06/27/2017 0857   K 4.3 03/16/2021 1104   K 3.9 06/27/2017 0857   CL 104 03/16/2021 1104   CL 103 06/27/2017 0857   CO2 25 02/19/2021 1320   CO2 26 06/27/2017 0857   BUN 27 (H) 03/16/2021 1104   BUN 34 (H) 03/16/2019 0938   BUN 12 06/27/2017 0857   CREATININE 1.40 (H) 03/16/2021 1104   CREATININE 1.47 (H) 02/19/2021 1320   CREATININE 1.65 (H) 04/18/2020 0956      Component Value Date/Time   CALCIUM 9.4 02/19/2021 1320   CALCIUM 9.2 06/27/2017 0857   ALKPHOS 64 02/19/2021 1320   ALKPHOS 128 (H) 06/27/2017 0857   AST 20 02/19/2021 1320   ALT 25 02/19/2021 1320   ALT 24 06/27/2017 0857   BILITOT 0.5 02/19/2021 1320       Impression and Plan: Mr. Mccanless is a very pleasant 80 yo caucasian gentleman with diffuse large B-cell lymphoma (not "double hit" lymphoma). He completed treatment in February 2019.  B 12 and Xgeva given  today.  Iron studies are pending. We will replace if needed.  Follow-up in 1 month.  He can contact our office with any questions or concerns.   Lottie Dawson, NP 9/27/20222:06 PM

## 2021-03-28 LAB — IRON AND TIBC
Iron: 92 ug/dL (ref 42–163)
Saturation Ratios: 27 % (ref 20–55)
TIBC: 341 ug/dL (ref 202–409)
UIBC: 249 ug/dL (ref 117–376)

## 2021-03-28 LAB — FERRITIN: Ferritin: 1300 ng/mL — ABNORMAL HIGH (ref 24–336)

## 2021-03-29 ENCOUNTER — Ambulatory Visit: Payer: Medicare Other | Admitting: Internal Medicine

## 2021-03-31 ENCOUNTER — Other Ambulatory Visit: Payer: Self-pay | Admitting: Cardiology

## 2021-04-03 ENCOUNTER — Encounter: Payer: Medicare Other | Admitting: Vascular Surgery

## 2021-04-04 DIAGNOSIS — Z23 Encounter for immunization: Secondary | ICD-10-CM | POA: Diagnosis not present

## 2021-04-05 DIAGNOSIS — E119 Type 2 diabetes mellitus without complications: Secondary | ICD-10-CM | POA: Diagnosis not present

## 2021-04-05 LAB — HM DIABETES EYE EXAM

## 2021-04-09 NOTE — Progress Notes (Signed)
Referring Physician: Dr. Rushie Nyhan  Patient name: Richard [name] MRN: [id number] DOB: 1940-10-12 Sex: male  REASON FOR CONSULT: Left arm swelling  HPI: ORELL [name] is a 80 y.o. male, with a 5 to 35-month history of increased left arm swelling.  His left arm is about 50% larger than his right arm.  He recently had an ulceration on his left hand and had some clear drainage from this.  He states the hand does have less swelling by the morning time if he elevates it while he is sleeping.  He has no prior history of DVT.  He does have a left-sided pacemaker with leads through the left subclavian vein.  He has also had axillary lymph nodes removed from the left side.  He has a right side Port-A-Cath.  The swelling is minimally bothersome to him.  He is still able to play golf and do his daily activities as he wishes.    02/13/21: Patient reports persistent left upper extremity swelling.  He saw his electrophysiologist who recommended central venogram before considering receding of pacemaker which seems to be working quite well for him.  03/13/21: Returns to discuss options. EP reviewed images and recommended venoplasty.  No change in left upper extremity symptoms.   04/10/21: Patient returns after venoplasty.  He had symptom resolution for 1 to 2 days after venoplasty but this recurred quickly thereafter.  I counseled him that he was likely to continue to have arm swelling as long as a foreign body remained in his left subclavian vein.  Past Medical History:  Diagnosis Date   Anemia    Arthritis    hips   Axillary adenopathy 02/25/2017   Bradycardia    a. holter monitor has demonstrated HRs in 30s and [place]ink[place]ach    CAD (coronary artery disease)    a. s/p CABG 2001  b.  07/28/2017 cath:   Severe three-vessel native CAD with total occlusion of LAD, ramus intermedius, first OM and RCA, patent RIMA to PDA, LIMA to LAD, sequential SVG to ramus intermedius and first OM.     Chronic lower back pain     Diffuse large B cell lymphoma (HCC)    Diverticulosis    Esophageal stricture    GERD (gastroesophageal reflux disease)    History of gout    HTN (hypertension)    Mixed hyperlipidemia    OSA on CPAP    with 2L O2 at night   Pancytopenia ([place])    a. related to chemo therapy for B cell lymphoma   Peptic stricture of esophagus    Presence of permanent cardiac pacemaker    sees Dr. Valaria Good pacemaker   SCC (squamous cell carcinoma) 01/31/2021   R zygoma, ED[facility]   SCC (squamous cell carcinoma) 01/31/2021   L post ankle, ED[facility]   SCC (squamous cell carcinoma) 01/31/2021   L popliteal, [facility]   Severe aortic stenosis    Spinal stenosis    Squamous cell carcinoma of skin 03/22/2009   Right mandible. SCCis, hypertrophic.    Type II diabetes mellitus ([place])    Wears dentures    partial upper   Past Surgical History:  Procedure Laterality Date   APPENDECTOMY  ~ [place] Left 11/06/2016   Procedure: CATARACT EXTRACTION PHACO AND INTRAOCULAR LENS PLACEMENT (IOC);  Surgeon: Estill Cotta, MD;  Location: [facility];  Service: Ophthalmology;  Laterality: Left;  Lot # [id number] H [place]: 01:09.4 AP%:25.2 CDE: 30.64  CATARACT EXTRACTION W/PHACO Right 12/04/2016   Procedure: CATARACT EXTRACTION PHACO AND INTRAOCULAR LENS PLACEMENT (IOC);  Surgeon: Estill Cotta, MD;  Location: [facility];  Service: Ophthalmology;  Laterality: Right;  [place] 1:25.9 AP% 24.1 CDE 39.10 Fluid Pack lot # [id number]   COLONOSCOPY W/ BIOPSIES AND POLYPECTOMY  2013   CORONARY ANGIOPLASTY  1993   CORONARY ANGIOPLASTY WITH STENT PLACEMENT  05/1997   "1"   CORONARY ARTERY BYPASS GRAFT  03/2000   "CABG X5"   ECTROPION REPAIR Right 09/01/2018   Procedure: REPAIR OF ECTROPION BILATERAL upper and lower;  Surgeon: Karle Starch, MD;  Location: [place];  Service: Ophthalmology;  Laterality: Right;  Diabetic - oral meds sleep apnea   ESOPHAGEAL DILATION  X 3-4   Dr. Lyla Son;  "last one was in the 1990's"   ESOPHAGOGASTRODUODENOSCOPY     multiple   FLEXIBLE SIGMOIDOSCOPY     multiple   HYDRADENITIS EXCISION Left 02/25/2017   Procedure: EXCISION DEEP LEFT AXILLARY LYMPH NODE;  Surgeon: Fanny Skates, MD;  Location: [place];  Service: General;  Laterality: Left;   INTRAOPERATIVE TRANSTHORACIC ECHOCARDIOGRAM N/A 09/09/2017   Procedure: INTRAOPERATIVE TRANSTHORACIC ECHOCARDIOGRAM;  Surgeon: Burnell Blanks, MD;  Location: [place];  Service: Open Heart Surgery;  Laterality: N/A;   KNEE ARTHROSCOPY Left 2011   meniscus repair   LEFT HEART CATHETERIZATION WITH CORONARY/GRAFT ANGIOGRAM N/A 03/16/2014   Procedure: LEFT HEART CATHETERIZATION WITH Beatrix Fetters;  Surgeon: Burnell Blanks, MD;  Location: [facility] CATH LAB;  Service: Cardiovascular;  Laterality: N/A;   LUMBAR LAMINECTOMY/DECOMPRESSION MICRODISCECTOMY Right 06/17/2013   Procedure: LUMBAR LAMINECTOMY MICRODISCECTOMY L4-L5 RIGHT EXCISION OF SYNOVIAL CYST RIGHT   (1 LEVEL) RIGHT PARTIAL FACETECTOMY;  Surgeon: Tobi Bastos, MD;  Location: [facility];  Service: Orthopedics;  Laterality: Right;   MYELOGRAM  04/06/13   lumbar, Dr Gladstone Lighter   ORBITAL LESION EXCISION Right 09/01/2018   Procedure: ORBITOTOMY WITHOUT BONE FLAP WITH REMOVAL OF LESION RIGHT;  Surgeon: Karle Starch, MD;  Location: [place];  Service: Ophthalmology;  Laterality: Right;   PACEMAKER IMPLANT N/A 09/10/2017   Procedure: PACEMAKER IMPLANT;  Surgeon: Deboraha Sprang, MD;  Location: [place] [facility];  Service: Cardiovascular;  Laterality: N/A;   PANENDOSCOPY     PERIPHERAL VASCULAR BALLOON ANGIOPLASTY Left 03/16/2021   Procedure: PERIPHERAL VASCULAR BALLOON ANGIOPLASTY;  Surgeon: [name], MD;  Location: [place] [facility];  Service: Cardiovascular;  Laterality: Left;  subclavian  vein   PORTACATH PLACEMENT N/A 03/06/2017   Procedure: INSERTION PORT-A-CATH AND ASPIRATE SEROMA LEFT AXILLA;  Surgeon: Fanny Skates, MD;   Location: [place];  Service: General;  Laterality: N/A;   RIGHT/LEFT HEART CATH AND CORONARY/GRAFT ANGIOGRAPHY N/A 07/28/2017   Procedure: RIGHT/LEFT HEART CATH AND CORONARY/GRAFT ANGIOGRAPHY;  Surgeon: Sherren Mocha, MD;  Location: [place] [facility];  Service: Cardiovascular;  Laterality: N/A;   SHOULDER SURGERY Right 08/2010   screws placed; "tendons tore off"   SKIN CANCER EXCISION Right    "neck"   TONSILLECTOMY  ~ [place] Left 06/29/2019   Procedure: TOTAL KNEE ARTHROPLASTY;  Surgeon: Paralee Cancel, MD;  Location: [facility];  Service: Orthopedics;  Laterality: Left;  70 mins   TRANSCATHETER AORTIC VALVE REPLACEMENT, TRANSFEMORAL N/A 09/09/2017   Procedure: TRANSCATHETER AORTIC VALVE REPLACEMENT, TRANSFEMORAL;  Surgeon: Burnell Blanks, MD;  Location: [place];  Service: Open Heart Surgery;  Laterality: N/A;   UPPER EXTREMITY VENOGRAPHY Left 02/15/2021   Procedure: CENTRAL VENO;  Surgeon: [name], MD;  Location: [place] [facility];  Service: Cardiovascular;  Laterality: Left;   UPPER EXTREMITY VENOGRAPHY N/A 03/16/2021   Procedure: UPPER EXTREMITY VENOGRAPHY;  Surgeon: [name], MD;  Location: [place] [facility];  Service: Cardiovascular;  Laterality: N/A;   UPPER GI ENDOSCOPY  2013   Gastritis; Dr Carlean Purl   VASECTOMY      Family History  Problem Relation Age of Onset   Stroke Father    Hypertension Father    Pancreatic cancer Mother    Diabetes Maternal Grandmother    Stroke Maternal Grandmother    Heart attack Paternal Grandmother    Colon cancer Neg Hx    Esophageal cancer Neg Hx    Rectal cancer Neg Hx    Stomach cancer Neg Hx    Ulcers Neg Hx     SOCIAL HISTORY: Social History   Socioeconomic History   Marital status: Divorced    Spouse name: Not on file   Number of children: 2   Years of education: college   Highest education level: Not on file  Occupational History    Employer: RETIRED  Tobacco Use   Smoking status:  Never   Smokeless tobacco: Never  Vaping Use   Vaping Use: Never used  Substance and Sexual Activity   Alcohol use: No    Alcohol/week: 0.0 standard drinks    Comment: "last drink was in 2012"( 03/15/2014)   Drug use: No   Sexual activity: Not Currently  Other Topics Concern   Not on file  Social History Narrative   ** Merged History Encounter **       Divorced, lives with a roommate. 1 son one daughter 3-4 caffeinated beverages daily Right-handed. 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   Social Determinants of Health   Financial Resource Strain: Low Risk    Difficulty of Paying Living Expenses: Not hard at all  Food Insecurity: Not on file  Transportation Needs: Not on file  Physical Activity: Not on file  Stress: Not on file  Social Connections: Not on file  Intimate Partner Violence: Not on file    Allergies  Allergen Reactions   Benazepril Swelling    angioedema; he is not a candidate for any angiotensin receptor blockers because of this significant allergic reaction. Because of a history of documented adverse serious drug reaction;Medi Alert bracelet  is recommended   Hctz [Hydrochlorothiazide] Anaphylaxis and Swelling    Tongue and lip swelling    Aspirin Other (See Comments)    Gastritis, cant take 325 Mg aspirin    Lactose Intolerance (Gi) Nausea And Vomiting    Current Outpatient Medications  Medication Sig Dispense Refill   acetaminophen (TYLENOL) 325 MG tablet Take 650 mg by mouth at bedtime as needed for moderate pain or headache.     allopurinol (ZYLOPRIM) 300 MG tablet Take 450 mg by mouth daily.     aluminum hydroxide-magnesium carbonate (GAVISCON) 95-358 MG/15ML SUSP Take 15 mLs by mouth as needed for indigestion or heartburn.     amLODipine (NORVASC) 5 MG tablet TAKE 1 TABLET BY MOUTH TWICE A DAY 180 tablet 3   apixaban  (ELIQUIS) 5 MG TABS tablet Take 1 tablet (5 mg total) by mouth 2 (two) times daily. 180 tablet 1   atorvastatin (LIPITOR) 80 MG tablet TAKE ONE TABLET BY MOUTH AT BEDTIME  90 tablet 3   azelastine (ASTELIN) 0.1 % nasal spray Place 2 sprays into both nostrils at bedtime as needed for rhinitis or allergies. (Patient taking differently: Place 2 sprays into both nostrils 2 (two) times daily as needed for rhinitis or allergies.) 30 mL 12   dapagliflozin propanediol (FARXIGA) 10 MG TABS tablet Take 10 mg by mouth daily.     esomeprazole (NEXIUM) 40 MG capsule Take 1 capsule (40 mg total) by mouth daily. 30 capsule 5   eye wash (,SODIUM/POTASSIUM/SOD CHLORIDE,) SOLN Place 1 drop into both eyes at bedtime. [facility] Soothing Eye Wash     ezetimibe (ZETIA) 10 MG tablet TAKE 1 TABLET BY MOUTH EVERY DAY 90 tablet 3   fenofibrate 160 MG tablet TAKE 1 TABLET BY MOUTH EVERY DAY 90 tablet 0   folic acid (FOLVITE) 1 MG tablet TAKE 2 TABLETS BY MOUTH EVERY DAY 180 tablet 4   FREESTYLE LITE test strip USE TO TEST BLOOD SUGAR ONCE A DAY. DX CODE: E11.9 100 strip 12   glimepiride (AMARYL) 1 MG tablet TAKE 1 TABLET BY MOUTH DAILY WITH BREAKFAST. 90 tablet 1   KLOR-CON M20 20 MEQ tablet TAKE 2 TABLETS (40 MEQ TOTAL) BY MOUTH 2 (TWO) TIMES DAILY. 360 tablet 3   Lancets (FREESTYLE) lancets USE TWICE A DAY TO CHECK BLOOD SUGAR. DX E11.9 100 each 6   Menthol-Camphor (ICY HOT ADVANCED PAIN RELIEF EX) Apply 1 application topically daily. Roll-on     metFORMIN (GLUCOPHAGE) 1000 MG tablet TAKE 1 & 1/2 TABLETS BY MOUTH EVERY MORNING AND 1 TABLET IN THE EVENING. (Patient taking differently: Take 1,000-1,500 mg by mouth See admin instructions. 1500 mg every morning, 1000 mg in the evening) 225 tablet 1   metoprolol tartrate (LOPRESSOR) 25 MG tablet TAKE ONE-HALF TABLET BY MOUTH TWICE DAILY (Patient taking differently: Take 12.5 mg by mouth 2 (two) times daily.) 90 tablet 3   nitroGLYCERIN (NITROSTAT) 0.4 MG SL tablet PLACE 1 TABLET  (0.4 MG TOTAL) UNDER THE TONGUE EVERY 5 (FIVE) MINUTES AS NEEDED FOR CHEST PAIN. 25 tablet 6   polycarbophil (FIBERCON) 625 MG tablet Take 625 mg by mouth daily as needed for moderate constipation.     pregabalin (LYRICA) 150 MG capsule TAKE 1 CAPSULE BY MOUTH TWICE A DAY 180 capsule 0   psyllium (METAMUCIL) 58.6 % powder Take 1 packet by mouth daily as needed (constipation).     torsemide (DEMADEX) 20 MG tablet Take 20 mg by mouth daily.     VASCEPA 1 g capsule TAKE 2 CAPSULES BY MOUTH TWICE A DAY 120 capsule 5   No current facility-administered medications for this visit.   Facility-Administered Medications Ordered in Other Visits  Medication Dose Route Frequency Provider Last Rate Last Admin   sodium chloride flush (NS) 0.9 % injection 10 mL  10 mL Intravenous PRN [name], MD   10 mL at 05/30/17 0920    ROS:   General:  No weight loss, Fever, chills  HEENT: No recent headaches, no nasal bleeding, no visual changes, no sore throat  Neurologic: No dizziness, blackouts, seizures. No recent symptoms of stroke or mini- stroke. No recent episodes of slurred speech, or temporary blindness.  Cardiac: No recent episodes of chest pain/pressure, no shortness of breath at rest.  No shortness of breath with exertion.  Denies history of atrial fibrillation or irregular heartbeat  Vascular: No history of rest pain in feet.  No history of claudication.  No history of non-healing ulcer, No history of  DVT   Pulmonary: No home oxygen, no productive cough, no hemoptysis,  No asthma or wheezing  Musculoskeletal:  [ ]  Arthritis, [ ]  Low back pain,  [ ]  Joint pain  Hematologic:No history of hypercoagulable state.  No history of easy bleeding.  No history of anemia  Gastrointestinal: No hematochezia or melena,  No gastroesophageal reflux, no trouble swallowing  Urinary: [ ]  chronic Kidney disease, [ ]  on HD - [ ]  MWF or [ ]  TTHS, [ ]  Burning with urination, [ ]  Frequent urination, [ ]   Difficulty urinating;   Skin: No rashes  Psychological: No history of anxiety,  No history of depression   Physical Examination  Vitals:   04/10/21 1341  BP: 129/71  Pulse: 77  Resp: 20  Temp: 98.5 F (36.9 C)  SpO2: 95%  Weight: 251 lb (113.9 kg)  Height: 6' (1.829 m)      Body mass index is 34.04 kg/m.  General:  Alert and oriented, no acute distress HEENT: Normal Neck: No bruit or JVD Pulmonary: Clear to auscultation bilaterally, large network of chest wall collaterals extending up into the left neck and left anterior chest, left side pacemaker, right side Port-A-Cath Cardiac: Regular Rate and Rhythm  Abdomen: Soft, non-tender, non-distended, no mass, no scars Skin: No rash Extremity Pulses:  2+ radial, brachial pulses bilaterally Musculoskeletal: No deformity diffuse left upper extremity edema approximately 50% larger than the right arm Neurologic: Upper and lower extremity motor 5/5 and symmetric   ASSESSMENT:  Left upper extremity edema secondary to subclavian vein stenosis about pacemaker wire insertion.  Limited benefit from simple venoplasty.  PLAN: Needs to discuss repositioning his pacemaker with his electrophysiologist.  He should follow-up with me as needed  [name]. Stanford Breed, MD [facility] Phone Number: [phone] 04/10/2021 4:54 PM

## 2021-04-10 ENCOUNTER — Other Ambulatory Visit: Payer: Self-pay

## 2021-04-10 ENCOUNTER — Ambulatory Visit (INDEPENDENT_AMBULATORY_CARE_PROVIDER_SITE_OTHER): Payer: Medicare Other | Admitting: Vascular Surgery

## 2021-04-10 ENCOUNTER — Encounter: Payer: Self-pay | Admitting: Vascular Surgery

## 2021-04-10 VITALS — BP 129/71 | HR 77 | Temp 98.5°F | Resp 20 | Ht 72.0 in | Wt 251.0 lb

## 2021-04-10 DIAGNOSIS — R600 Localized edema: Secondary | ICD-10-CM | POA: Diagnosis not present

## 2021-04-17 ENCOUNTER — Telehealth: Payer: Self-pay | Admitting: Pharmacist

## 2021-04-17 NOTE — Chronic Care Management (AMB) (Unsigned)
Chronic Care Management Pharmacy Assistant   Name: Richard [name]  MRN: [id number] DOB: 1941-01-09  Reason for Encounter: Disease State Diabetes   Recent office visits:  ***  Recent consult visits:  04/10/21 Sunny Schlein MD- pt was seen for edema of left upper arm post op. Follow up PRN  03/21/21 Podiatry Gardiner Barefoot DPM- pt was seen for Pain due to onychomycosis of toenails of both feet. Return office visit  12 weeks. Told patient to return for periodic foot care and evaluation due to potential at risk complications.    03/13/21 Vasc Jamelle Haring MD- pt seen for    Hospital visits:  Medication Reconciliation was completed by comparing discharge summary, patient's EMR and Pharmacy list.  Admitted to the hospital on 03/16/21 due to  Venography. Discharge date was 03/16/21. Discharged from [facility]?Medications Started at [facility] Discharge:?? -started none  Medication Changes at Hospital Discharge: -Changed none  Medications Discontinued at Hospital Discharge: -Stopped none  Medications that remain the same after Hospital Discharge:??  -All other medications will remain the same.    Medications: Outpatient Encounter Medications as of 04/17/2021  Medication Sig Note   acetaminophen (TYLENOL) 325 MG tablet Take 650 mg by mouth at bedtime as needed for moderate pain or headache.    allopurinol (ZYLOPRIM) 300 MG tablet Take 450 mg by mouth daily.    aluminum hydroxide-magnesium carbonate (GAVISCON) 95-358 MG/15ML SUSP Take 15 mLs by mouth as needed for indigestion or heartburn.    amLODipine (NORVASC) 5 MG tablet TAKE 1 TABLET BY MOUTH TWICE A DAY    apixaban (ELIQUIS) 5 MG TABS tablet Take 1 tablet (5 mg total) by mouth 2 (two) times daily.    atorvastatin (LIPITOR) 80 MG tablet TAKE ONE TABLET BY MOUTH AT BEDTIME    azelastine (ASTELIN) 0.1 % nasal spray Place 2 sprays into both nostrils at bedtime as needed for rhinitis or allergies. (Patient  taking differently: Place 2 sprays into both nostrils 2 (two) times daily as needed for rhinitis or allergies.) 03/14/2021: Uses every morning    dapagliflozin propanediol (FARXIGA) 10 MG TABS tablet Take 10 mg by mouth daily.    esomeprazole (NEXIUM) 40 MG capsule Take 1 capsule (40 mg total) by mouth daily.    eye wash (,SODIUM/POTASSIUM/SOD CHLORIDE,) SOLN Place 1 drop into both eyes at bedtime. [facility] Soothing Eye Wash    ezetimibe (ZETIA) 10 MG tablet TAKE 1 TABLET BY MOUTH EVERY DAY    fenofibrate 160 MG tablet TAKE 1 TABLET BY MOUTH EVERY DAY    folic acid (FOLVITE) 1 MG tablet TAKE 2 TABLETS BY MOUTH EVERY DAY    FREESTYLE LITE test strip USE TO TEST BLOOD SUGAR ONCE A DAY. DX CODE: E11.9    glimepiride (AMARYL) 1 MG tablet TAKE 1 TABLET BY MOUTH DAILY WITH BREAKFAST.    KLOR-CON M20 20 MEQ tablet TAKE 2 TABLETS (40 MEQ TOTAL) BY MOUTH 2 (TWO) TIMES DAILY.    Lancets (FREESTYLE) lancets USE TWICE A DAY TO CHECK BLOOD SUGAR. DX E11.9    Menthol-Camphor (ICY HOT ADVANCED PAIN RELIEF EX) Apply 1 application topically daily. Roll-on    metFORMIN (GLUCOPHAGE) 1000 MG tablet TAKE 1 & 1/2 TABLETS BY MOUTH EVERY MORNING AND 1 TABLET IN THE EVENING. (Patient taking differently: Take 1,000-1,500 mg by mouth See admin instructions. 1500 mg every morning, 1000 mg in the evening)    metoprolol tartrate (LOPRESSOR) 25 MG tablet TAKE ONE-HALF TABLET BY MOUTH  TWICE DAILY (Patient taking differently: Take 12.5 mg by mouth 2 (two) times daily.)    nitroGLYCERIN (NITROSTAT) 0.4 MG SL tablet PLACE 1 TABLET (0.4 MG TOTAL) UNDER THE TONGUE EVERY 5 (FIVE) MINUTES AS NEEDED FOR CHEST PAIN.    polycarbophil (FIBERCON) 625 MG tablet Take 625 mg by mouth daily as needed for moderate constipation.    pregabalin (LYRICA) 150 MG capsule TAKE 1 CAPSULE BY MOUTH TWICE A DAY    psyllium (METAMUCIL) 58.6 % powder Take 1 packet by mouth daily as needed (constipation).    torsemide (DEMADEX) 20 MG tablet Take 20 mg by mouth  daily.    VASCEPA 1 g capsule TAKE 2 CAPSULES BY MOUTH TWICE A DAY    Facility-Administered Encounter Medications as of 04/17/2021  Medication   sodium chloride flush (NS) 0.9 % injection 10 mL    Care Gaps:  Star Rating Drugs:  SIG***

## 2021-04-22 ENCOUNTER — Other Ambulatory Visit: Payer: Self-pay | Admitting: Family Medicine

## 2021-04-25 ENCOUNTER — Inpatient Hospital Stay: Payer: Medicare Other | Attending: Hematology & Oncology

## 2021-04-25 ENCOUNTER — Other Ambulatory Visit: Payer: Self-pay

## 2021-04-25 ENCOUNTER — Encounter: Payer: Self-pay | Admitting: Family

## 2021-04-25 ENCOUNTER — Inpatient Hospital Stay: Payer: Medicare Other

## 2021-04-25 ENCOUNTER — Inpatient Hospital Stay (HOSPITAL_BASED_OUTPATIENT_CLINIC_OR_DEPARTMENT_OTHER): Payer: Medicare Other | Admitting: Family

## 2021-04-25 VITALS — BP 131/66 | HR 71 | Temp 98.1°F | Wt 245.0 lb

## 2021-04-25 DIAGNOSIS — D500 Iron deficiency anemia secondary to blood loss (chronic): Secondary | ICD-10-CM

## 2021-04-25 DIAGNOSIS — D519 Vitamin B12 deficiency anemia, unspecified: Secondary | ICD-10-CM

## 2021-04-25 DIAGNOSIS — C8332 Diffuse large B-cell lymphoma, intrathoracic lymph nodes: Secondary | ICD-10-CM | POA: Diagnosis not present

## 2021-04-25 DIAGNOSIS — Z8572 Personal history of non-Hodgkin lymphomas: Secondary | ICD-10-CM | POA: Insufficient documentation

## 2021-04-25 DIAGNOSIS — D510 Vitamin B12 deficiency anemia due to intrinsic factor deficiency: Secondary | ICD-10-CM | POA: Insufficient documentation

## 2021-04-25 DIAGNOSIS — D508 Other iron deficiency anemias: Secondary | ICD-10-CM

## 2021-04-25 DIAGNOSIS — Z95828 Presence of other vascular implants and grafts: Secondary | ICD-10-CM

## 2021-04-25 LAB — CBC WITH DIFFERENTIAL (CANCER CENTER ONLY)
Abs Immature Granulocytes: 0.06 10*3/uL (ref 0.00–0.07)
Basophils Absolute: 0.1 10*3/uL (ref 0.0–0.1)
Basophils Relative: 1 %
Eosinophils Absolute: 0.2 10*3/uL (ref 0.0–0.5)
Eosinophils Relative: 2 %
HCT: 40.7 % (ref 39.0–52.0)
Hemoglobin: 13.1 g/dL (ref 13.0–17.0)
Immature Granulocytes: 1 %
Lymphocytes Relative: 20 %
Lymphs Abs: 1.8 10*3/uL (ref 0.7–4.0)
MCH: 29.6 pg (ref 26.0–34.0)
MCHC: 32.2 g/dL (ref 30.0–36.0)
MCV: 92.1 fL (ref 80.0–100.0)
Monocytes Absolute: 0.6 10*3/uL (ref 0.1–1.0)
Monocytes Relative: 7 %
Neutro Abs: 6.2 10*3/uL (ref 1.7–7.7)
Neutrophils Relative %: 69 %
Platelet Count: 204 10*3/uL (ref 150–400)
RBC: 4.42 MIL/uL (ref 4.22–5.81)
RDW: 16.2 % — ABNORMAL HIGH (ref 11.5–15.5)
WBC Count: 9 10*3/uL (ref 4.0–10.5)
nRBC: 0 % (ref 0.0–0.2)

## 2021-04-25 LAB — RETICULOCYTES
Immature Retic Fract: 29.4 % — ABNORMAL HIGH (ref 2.3–15.9)
RBC.: 4.39 MIL/uL (ref 4.22–5.81)
Retic Count, Absolute: 78.1 10*3/uL (ref 19.0–186.0)
Retic Ct Pct: 1.8 % (ref 0.4–3.1)

## 2021-04-25 LAB — CMP (CANCER CENTER ONLY)
ALT: 14 U/L (ref 0–44)
AST: 15 U/L (ref 15–41)
Albumin: 4.6 g/dL (ref 3.5–5.0)
Alkaline Phosphatase: 57 U/L (ref 38–126)
Anion gap: 9 (ref 5–15)
BUN: 26 mg/dL — ABNORMAL HIGH (ref 8–23)
CO2: 26 mmol/L (ref 22–32)
Calcium: 10.1 mg/dL (ref 8.9–10.3)
Chloride: 104 mmol/L (ref 98–111)
Creatinine: 1.33 mg/dL — ABNORMAL HIGH (ref 0.61–1.24)
GFR, Estimated: 53 mL/min — ABNORMAL LOW (ref >60)
Glucose, Bld: 105 mg/dL — ABNORMAL HIGH (ref 70–99)
Potassium: 4.6 mmol/L (ref 3.5–5.1)
Sodium: 139 mmol/L (ref 135–145)
Total Bilirubin: 0.5 mg/dL (ref 0.3–1.2)
Total Protein: 7.2 g/dL (ref 6.5–8.1)

## 2021-04-25 LAB — VITAMIN B12: Vitamin B-12: 174 pg/mL — ABNORMAL LOW (ref 180–914)

## 2021-04-25 LAB — LACTATE DEHYDROGENASE: LDH: 182 U/L (ref 98–192)

## 2021-04-25 MED ORDER — SODIUM CHLORIDE 0.9% FLUSH
10.0000 mL | INTRAVENOUS | Status: DC | PRN
  Administered 2021-04-25: 10 mL via INTRAVENOUS

## 2021-04-25 MED ORDER — CYANOCOBALAMIN 1000 MCG/ML IJ SOLN
1000.0000 ug | Freq: Once | INTRAMUSCULAR | Status: AC
  Administered 2021-04-25: 1000 ug via INTRAMUSCULAR
  Filled 2021-04-25: qty 1

## 2021-04-25 MED ORDER — HEPARIN SOD (PORK) LOCK FLUSH 100 UNIT/ML IV SOLN
500.0000 [IU] | Freq: Once | INTRAVENOUS | Status: AC
  Administered 2021-04-25: 500 [IU] via INTRAVENOUS

## 2021-04-25 NOTE — Patient Instructions (Signed)
Hydroxocobalamin injection What is this medication? HYDROXOCOBALAMIN (hye drox oh koe BAL a min) is a form of vitamin B12. Vitamin B12 helps the growth of healthy blood cells, nerve cells, and proteins in the body. It also helps with the metabolism of fats and carbohydrates. This medicine is used to treat people with low levels of vitamin B12 or those who can not absorb vitamin B12. It also helps with the metabolism of fats andcarbohydrates. It is also used as an antidote to treat cyanide poisoning. This medicine may be used for other purposes; ask your health care provider orpharmacist if you have questions. COMMON BRAND NAME(S): Cyanokit, Primabalt-RP What should I tell my care team before I take this medication? They need to know if you have any of these conditions: high blood pressure iron-deficiency anemia kidney disease low levels of folic acid in the blood megaloblastic anemia an unusual or allergic reaction to hydroxocobalamin, cyanocobalamin, cobalt, other medicines, foods, dyes, or preservatives pregnant or trying to get pregnant breast-feeding How should I use this medication? This medicine is for injection into a muscle or a vein. It is given by a healthcare professional in a clinic, doctor's office, or hospital. Talk to your pediatrician regarding the use of this medicine in children.Special care may be needed. Overdosage: If you think you have taken too much of this medicine contact apoison control center or emergency room at once. NOTE: This medicine is only for you. Do not share this medicine with others. What if I miss a dose? It is important not to miss your dose. Call your doctor or health careprofessional if you are unable to keep an appointment. What may interact with this medication? chemotherapy chloramphenicol This list may not describe all possible interactions. Give your health care provider a list of all the medicines, herbs, non-prescription drugs, or dietary  supplements you use. Also tell them if you smoke, drink alcohol, or use illegaldrugs. Some items may interact with your medicine. What should I watch for while using this medication? Visit your doctor or health care professional regularly. You may need bloodwork done while you are taking this medicine. You may need to follow a special diet. Talk to your doctor. Limit your alcoholintake and avoid smoking to get the best benefit. What side effects may I notice from receiving this medication? Side effects that you should report to your doctor or health care professionalas soon as possible: allergic reactions like skin rash, itching or hives, swelling of the face, lips, or tongue breathing problems chest tightness, pain dizziness shaking or shivering Side effects that usually do not require medical attention (report to yourdoctor or health care professional if they continue or are bothersome): diarrhea headache nausea, vomiting pain at site where injected red colored urine red tint to skin This list may not describe all possible side effects. Call your doctor for medical advice about side effects. You may report side effects to FDA at1-800-FDA-1088. Where should I keep my medication? This drug is given in a hospital or clinic and will not be stored at home. NOTE: This sheet is a summary. It may not cover all possible information. If you have questions about this medicine, talk to your doctor, pharmacist, orhealth care provider.  2022 Elsevier/Gold Standard (2007-11-02 14:49:26)  

## 2021-04-25 NOTE — Progress Notes (Signed)
Hematology and Oncology Follow Up Visit  Richard [name] [id number] 01/16/1941 80 y.o. 04/25/2021   Principle Diagnosis:  Diffuse large cell non-Hodgkin's lymphoma (IPI = 3) - NOT "double hit" Pernicious anemia Iron deficiency secondary to bleeding   Past Therapy: R-CHOP - s/p cycle 8 - completed 08/2017   Current Therapy:        Vitamin B12 1 mg IM every month Xgeva 120 mg subcu q 3 months - next dose due in 05/2021 IV iron as indicated                Interim History:  Richard [name] is here today for follow-up and B 12. He is doing well but has taken a break this week from golfing. He has an appointment with his EP doctor next week on Tuesday for follow-up regarding the swelling in his left arm and discuss replacement of his pacemaker.  No other swelling noted. The neuropathy in his legs is unchanged from baseline.  No falls or syncope reported.  He denies fever, chills, n/v, cough, rash, dizziness, SOB, chest pain, palpitations, abdominal pain or changes in bowel or bladder habits.  He states that he sees his nephrologist tomorrow for routine follow-up.  No blood loss noted. No abnormal bruising, no petechiae.  He has been eating well and is staying well hydrated. His weight is 245 lbs.   ECOG Performance Status: 1 - Symptomatic but completely ambulatory  Medications:  Allergies as of 04/25/2021       Reactions   Benazepril Swelling   angioedema; he is not a candidate for any angiotensin receptor blockers because of this significant allergic reaction. Because of a history of documented adverse serious drug reaction;Medi Alert bracelet  is recommended   Hctz [hydrochlorothiazide] Anaphylaxis, Swelling   Tongue and lip swelling   Aspirin Other (See Comments)   Gastritis, cant take 325 Mg aspirin    Lactose Intolerance (gi) Nausea And Vomiting        Medication List        Accurate as of April 25, 2021  1:47 PM. If you have any questions, ask your nurse or doctor.           acetaminophen 325 MG tablet Commonly known as: TYLENOL Take 650 mg by mouth at bedtime as needed for moderate pain or headache.   allopurinol 300 MG tablet Commonly known as: ZYLOPRIM Take 450 mg by mouth daily.   aluminum hydroxide-magnesium carbonate 95-358 MG/15ML Susp Commonly known as: GAVISCON Take 15 mLs by mouth as needed for indigestion or heartburn.   amLODipine 5 MG tablet Commonly known as: NORVASC TAKE 1 TABLET BY MOUTH TWICE A DAY   apixaban 5 MG Tabs tablet Commonly known as: ELIQUIS Take 1 tablet (5 mg total) by mouth 2 (two) times daily.   atorvastatin 80 MG tablet Commonly known as: LIPITOR TAKE ONE TABLET BY MOUTH AT BEDTIME   azelastine 0.1 % nasal spray Commonly known as: ASTELIN Place 2 sprays into both nostrils at bedtime as needed for rhinitis or allergies. What changed: when to take this   dapagliflozin propanediol 10 MG Tabs tablet Commonly known as: FARXIGA Take 10 mg by mouth daily.   esomeprazole 40 MG capsule Commonly known as: NexIUM Take 1 capsule (40 mg total) by mouth daily.   eye wash Soln Place 1 drop into both eyes at bedtime. [facility] Soothing Eye Wash   ezetimibe 10 MG tablet Commonly known as: ZETIA TAKE 1 TABLET BY MOUTH EVERY DAY  fenofibrate 160 MG tablet TAKE 1 TABLET BY MOUTH EVERY DAY   folic acid 1 MG tablet Commonly known as: FOLVITE TAKE 2 TABLETS BY MOUTH EVERY DAY   freestyle lancets USE TWICE A DAY TO CHECK BLOOD SUGAR. DX E11.9   FREESTYLE LITE test strip Generic drug: glucose blood USE TO TEST BLOOD SUGAR ONCE A DAY. DX CODE: E11.9   glimepiride 1 MG tablet Commonly known as: AMARYL TAKE 1 TABLET BY MOUTH DAILY WITH BREAKFAST.   ICY HOT ADVANCED PAIN RELIEF EX Apply 1 application topically daily. Roll-on   Klor-Con M20 20 MEQ tablet Generic drug: potassium chloride SA TAKE 2 TABLETS (40 MEQ TOTAL) BY MOUTH 2 (TWO) TIMES DAILY.   metFORMIN 1000 MG tablet Commonly known as:  GLUCOPHAGE TAKE 1 & 1/2 TABLETS BY MOUTH EVERY MORNING AND 1 TABLET IN THE EVENING.   metoprolol tartrate 25 MG tablet Commonly known as: LOPRESSOR TAKE ONE-HALF TABLET BY MOUTH TWICE DAILY   nitroGLYCERIN 0.4 MG SL tablet Commonly known as: NITROSTAT PLACE 1 TABLET (0.4 MG TOTAL) UNDER THE TONGUE EVERY 5 (FIVE) MINUTES AS NEEDED FOR CHEST PAIN.   polycarbophil 625 MG tablet Commonly known as: FIBERCON Take 625 mg by mouth daily as needed for moderate constipation.   pregabalin 150 MG capsule Commonly known as: LYRICA TAKE 1 CAPSULE BY MOUTH TWICE A DAY   psyllium 58.6 % powder Commonly known as: METAMUCIL Take 1 packet by mouth daily as needed (constipation).   torsemide 20 MG tablet Commonly known as: DEMADEX Take 20 mg by mouth daily.   Vascepa 1 g capsule Generic drug: icosapent Ethyl TAKE 2 CAPSULES BY MOUTH TWICE A DAY        Allergies:  Allergies  Allergen Reactions   Benazepril Swelling    angioedema; he is not a candidate for any angiotensin receptor blockers because of this significant allergic reaction. Because of a history of documented adverse serious drug reaction;Medi Alert bracelet  is recommended   Hctz [Hydrochlorothiazide] Anaphylaxis and Swelling    Tongue and lip swelling    Aspirin Other (See Comments)    Gastritis, cant take 325 Mg aspirin    Lactose Intolerance (Gi) Nausea And Vomiting    Past Medical History, Surgical history, Social history, and Family History were reviewed and updated.  Review of Systems: All other 10 point review of systems is negative.   Physical Exam:  vitals were not taken for this visit.   Wt Readings from Last 3 Encounters:  04/10/21 251 lb (113.9 kg)  03/27/21 248 lb 1.3 oz (112.5 kg)  03/16/21 242 lb (109.8 kg)    Ocular: Sclerae unicteric, pupils equal, round and reactive to light Ear-nose-throat: Oropharynx clear, dentition fair Lymphatic: No cervical or supraclavicular adenopathy Lungs no rales or  rhonchi, good excursion bilaterally Heart regular rate and rhythm, no murmur appreciated Abd soft, nontender, positive bowel sounds MSK no focal spinal tenderness, no joint edema Neuro: non-focal, well-oriented, appropriate affect Breasts: Deferred   Lab Results  Component Value Date   WBC 9.0 04/25/2021   HGB 13.1 04/25/2021   HCT 40.7 04/25/2021   MCV 92.1 04/25/2021   PLT 204 04/25/2021   Lab Results  Component Value Date   FERRITIN 1,300 (H) 03/27/2021   IRON 92 03/27/2021   TIBC 341 03/27/2021   UIBC 249 03/27/2021   IRONPCTSAT 27 03/27/2021   Lab Results  Component Value Date   RETICCTPCT 1.8 04/25/2021   RBC 4.42 04/25/2021   RBC 4.39 04/25/2021  RETICCTABS 52.1 11/22/2011   No results found for: Nils Pyle [facility] Lab Results  Component Value Date   IGA 159 03/09/2012   Lab Results  Component Value Date   ALBUMINELP 4.2 07/27/2018   MSPIKE Not Observed 07/27/2018     Chemistry      Component Value Date/Time   NA 142 03/27/2021 1338   NA 136 03/16/2019 0938   NA 144 06/27/2017 0857   K 4.3 03/27/2021 1338   K 3.9 06/27/2017 0857   CL 104 03/27/2021 1338   CL 103 06/27/2017 0857   CO2 27 03/27/2021 1338   CO2 26 06/27/2017 0857   BUN 28 (H) 03/27/2021 1338   BUN 34 (H) 03/16/2019 0938   BUN 12 06/27/2017 0857   CREATININE 1.44 (H) 03/27/2021 1338   CREATININE 1.65 (H) 04/18/2020 0956      Component Value Date/Time   CALCIUM 9.7 03/27/2021 1338   CALCIUM 9.2 06/27/2017 0857   ALKPHOS 54 03/27/2021 1338   ALKPHOS 128 (H) 06/27/2017 0857   AST 15 03/27/2021 1338   ALT 16 03/27/2021 1338   ALT 24 06/27/2017 0857   BILITOT 0.6 03/27/2021 1338       Impression and Plan: Mr. Hellen is a very pleasant 80 yo caucasian gentleman with diffuse large B-cell lymphoma (not "double hit" lymphoma). He completed treatment in February 2019.  B 12 given.  Iron studies are pending. We will replace if needed.  Delton See due again in December.   Follow-up in 1 months.  She can contact our office with any questions or concerns.   Lottie Dawson, NP 10/26/20221:47 PM

## 2021-04-26 ENCOUNTER — Telehealth: Payer: Self-pay | Admitting: *Deleted

## 2021-04-26 DIAGNOSIS — D631 Anemia in chronic kidney disease: Secondary | ICD-10-CM | POA: Diagnosis not present

## 2021-04-26 DIAGNOSIS — I129 Hypertensive chronic kidney disease with stage 1 through stage 4 chronic kidney disease, or unspecified chronic kidney disease: Secondary | ICD-10-CM | POA: Diagnosis not present

## 2021-04-26 DIAGNOSIS — N1831 Chronic kidney disease, stage 3a: Secondary | ICD-10-CM | POA: Diagnosis not present

## 2021-04-26 DIAGNOSIS — N2581 Secondary hyperparathyroidism of renal origin: Secondary | ICD-10-CM | POA: Diagnosis not present

## 2021-04-26 LAB — IRON AND TIBC
Iron: 75 ug/dL (ref 42–163)
Saturation Ratios: 22 % (ref 20–55)
TIBC: 335 ug/dL (ref 202–409)
UIBC: 260 ug/dL (ref 117–376)

## 2021-04-26 LAB — FERRITIN: Ferritin: 1279 ng/mL — ABNORMAL HIGH (ref 24–336)

## 2021-04-26 NOTE — Telephone Encounter (Signed)
No 04/25/21 LOS

## 2021-05-01 ENCOUNTER — Ambulatory Visit (INDEPENDENT_AMBULATORY_CARE_PROVIDER_SITE_OTHER): Payer: Medicare Other | Admitting: Internal Medicine

## 2021-05-01 ENCOUNTER — Encounter: Payer: Self-pay | Admitting: Internal Medicine

## 2021-05-01 ENCOUNTER — Other Ambulatory Visit: Payer: Self-pay

## 2021-05-01 VITALS — BP 124/60 | HR 80 | Ht 72.0 in

## 2021-05-01 DIAGNOSIS — I480 Paroxysmal atrial fibrillation: Secondary | ICD-10-CM | POA: Diagnosis not present

## 2021-05-01 DIAGNOSIS — I25810 Atherosclerosis of coronary artery bypass graft(s) without angina pectoris: Secondary | ICD-10-CM | POA: Diagnosis not present

## 2021-05-01 DIAGNOSIS — I442 Atrioventricular block, complete: Secondary | ICD-10-CM | POA: Diagnosis not present

## 2021-05-01 DIAGNOSIS — Z950 Presence of cardiac pacemaker: Secondary | ICD-10-CM | POA: Diagnosis not present

## 2021-05-01 LAB — CUP PACEART INCLINIC DEVICE CHECK
Battery Remaining Longevity: 25 mo
Battery Voltage: 2.92 V
Brady Statistic AP VP Percent: 11.75 %
Brady Statistic AP VS Percent: 0 %
Brady Statistic AS VP Percent: 87.93 %
Brady Statistic AS VS Percent: 0.33 %
Brady Statistic RA Percent Paced: 11.83 %
Brady Statistic RV Percent Paced: 99.67 %
Date Time Interrogation Session: 20221101165810
Implantable Lead Implant Date: 20190313
Implantable Lead Implant Date: 20190313
Implantable Lead Location: 753859
Implantable Lead Location: 753860
Implantable Lead Model: 3830
Implantable Lead Model: 5076
Implantable Pulse Generator Implant Date: 20190313
Lead Channel Impedance Value: 285 Ohm
Lead Channel Impedance Value: 323 Ohm
Lead Channel Impedance Value: 399 Ohm
Lead Channel Impedance Value: 418 Ohm
Lead Channel Pacing Threshold Amplitude: 1 V
Lead Channel Pacing Threshold Amplitude: 1 V
Lead Channel Pacing Threshold Pulse Width: 0.4 ms
Lead Channel Pacing Threshold Pulse Width: 0.4 ms
Lead Channel Sensing Intrinsic Amplitude: 1.75 mV
Lead Channel Sensing Intrinsic Amplitude: 1.875 mV
Lead Channel Sensing Intrinsic Amplitude: 2.5 mV
Lead Channel Sensing Intrinsic Amplitude: 5.5 mV
Lead Channel Setting Pacing Amplitude: 2 V
Lead Channel Setting Pacing Amplitude: 2.5 V
Lead Channel Setting Pacing Pulse Width: 1 ms
Lead Channel Setting Sensing Sensitivity: 1.2 mV

## 2021-05-01 NOTE — Patient Instructions (Signed)
Medication Instructions:  Your physician recommends that you continue on your current medications as directed. Please refer to the Current Medication list given to you today.  *If you need a refill on your cardiac medications before your next appointment, please call your pharmacy*   Lab Work: None ordered.  If you have labs (blood work) drawn today and your tests are completely normal, you will receive your results only by: . MyChart Message (if you have MyChart) OR . A paper copy in the mail If you have any lab test that is abnormal or we need to change your treatment, we will call you to review the results.   Testing/Procedures: None ordered.    Follow-Up: At [facility], you and your health needs are our priority.  As part of our continuing mission to provide you with exceptional heart care, we have created designated Provider Care Teams.  These Care Teams include your primary Cardiologist (physician) and Advanced Practice Providers (APPs -  Physician Assistants and Nurse Practitioners) who all work together to provide you with the care you need, when you need it.  We recommend signing up for the patient portal called "MyChart".  Sign up information is provided on this After Visit Summary.  MyChart is used to connect with patients for Virtual Visits (Telemedicine).  Patients are able to view lab/test results, encounter notes, upcoming appointments, etc.  Non-urgent messages can be sent to your provider as well.   To learn more about what you can do with MyChart, go to [email or web address].    Your next appointment:   As scheduled  

## 2021-05-01 NOTE — Progress Notes (Signed)
Mph no     Patient Care Team: Carollee Herter, Alferd Apa, DO as PCP - General (Family Medicine) Minus Breeding, MD as PCP - Cardiology (Cardiology) Deboraha Sprang, MD as PCP - Electrophysiology (Cardiology) Dingeldein, Remo Lipps, MD (Ophthalmology) Rigoberto Noel, MD as Consulting Physician (Pulmonary Disease) [name], MD as Consulting Physician (Oncology) Minus Breeding, MD as Consulting Physician (Cardiology) Parrett, Fonnie Mu, NP as Nurse Practitioner (Pulmonary Disease) [facility] [place] [facility] (Dermatology) Latanya Maudlin, MD as Consulting Physician (Orthopedic Surgery) Kidney, Ellyn Hack, [place], RPH-CPP (Pharmacist)   HPI  Richard [name] is a 80 y.o. male seen in follow-up for TAVR complicated by heart block requiring pacing for which he received a Medtronic His bundle device (3/19) .  He has persistent atrial fibrillation and is anticoagulated with Apixoban  *and has a history of coronary artery disease with prior bypass surgery  There were device changes made 3/22 which were poorly tolerated.  Return to his previous programming and has felt good.   The patient denies chest pain, shortness of breath, nocturnal dyspnea, orthopnea or peripheral edema.  There have been no palpitations, lightheadedness or syncope.  Complains of swelling of his left arm.  He had had a left lymph node dissection prior to device implantation, but also had a port in his right venous systems of the left arm was chosen anyway.  Swelling developed about 3 years following device implantation.  Venography demonstrated occlusion and he underwent venoplasty without success.   Lifting his arm at night and his son has procured a arm lifting system that he uses during the day is associated with significant improvement; wearing a sleeve has not been of benefit..        Records and Results Reviewed   Past Medical History:  Diagnosis Date   Anemia    Arthritis    hips   Axillary adenopathy 02/25/2017    Bradycardia    a. holter monitor has demonstrated HRs in 30s and [place]ach    CAD (coronary artery disease)    a. s/p CABG 2001  b.  07/28/2017 cath:   Severe three-vessel native CAD with total occlusion of LAD, ramus intermedius, first OM and RCA, patent RIMA to PDA, LIMA to LAD, sequential SVG to ramus intermedius and first OM.     Chronic lower back pain    Diffuse large B cell lymphoma (HCC)    Diverticulosis    Esophageal stricture    GERD (gastroesophageal reflux disease)    History of gout    HTN (hypertension)    Mixed hyperlipidemia    OSA on CPAP    with 2L O2 at night   Pancytopenia ([place])    a. related to chemo therapy for B cell lymphoma   Peptic stricture of esophagus    Presence of permanent cardiac pacemaker    sees Dr. Valaria Good pacemaker   SCC (squamous cell carcinoma) 01/31/2021   R zygoma, EDC   SCC (squamous cell carcinoma) 01/31/2021   L post ankle, EDC   SCC (squamous cell carcinoma) 01/31/2021   L popliteal, EDC   Severe aortic stenosis    Spinal stenosis    Squamous cell carcinoma of skin 03/22/2009   Right mandible. SCCis, hypertrophic.    Type II diabetes mellitus ([place])    Wears dentures    partial upper    Past Surgical History:  Procedure Laterality Date   APPENDECTOMY  ~ [place] Left 11/06/2016  Procedure: CATARACT EXTRACTION PHACO AND INTRAOCULAR LENS PLACEMENT (IOC);  Surgeon: Estill Cotta, MD;  Location: [facility];  Service: Ophthalmology;  Laterality: Left;  Lot # [id number] H [place]: 01:09.4 AP%:25.2 CDE: 30.64   CATARACT EXTRACTION W/PHACO Right 12/04/2016   Procedure: CATARACT EXTRACTION PHACO AND INTRAOCULAR LENS PLACEMENT (IOC);  Surgeon: Estill Cotta, MD;  Location: [facility];  Service: Ophthalmology;  Laterality: Right;  [place] 1:25.9 AP% 24.1 CDE 39.10 Fluid Pack lot # [id number]   COLONOSCOPY W/ BIOPSIES AND POLYPECTOMY  2013   CORONARY ANGIOPLASTY  1993   CORONARY ANGIOPLASTY  WITH STENT PLACEMENT  05/1997   "1"   CORONARY ARTERY BYPASS GRAFT  03/2000   "CABG X5"   ECTROPION REPAIR Right 09/01/2018   Procedure: REPAIR OF ECTROPION BILATERAL upper and lower;  Surgeon: Karle Starch, MD;  Location: [place];  Service: Ophthalmology;  Laterality: Right;  Diabetic - oral meds sleep apnea   ESOPHAGEAL DILATION  X 3-4   Dr. Lyla Son; "last one was in the 1990's"   ESOPHAGOGASTRODUODENOSCOPY     multiple   FLEXIBLE SIGMOIDOSCOPY     multiple   HYDRADENITIS EXCISION Left 02/25/2017   Procedure: EXCISION DEEP LEFT AXILLARY LYMPH NODE;  Surgeon: Fanny Skates, MD;  Location: [place];  Service: General;  Laterality: Left;   INTRAOPERATIVE TRANSTHORACIC ECHOCARDIOGRAM N/A 09/09/2017   Procedure: INTRAOPERATIVE TRANSTHORACIC ECHOCARDIOGRAM;  Surgeon: Burnell Blanks, MD;  Location: [place];  Service: Open Heart Surgery;  Laterality: N/A;   KNEE ARTHROSCOPY Left 2011   meniscus repair   LEFT HEART CATHETERIZATION WITH CORONARY/GRAFT ANGIOGRAM N/A 03/16/2014   Procedure: LEFT HEART CATHETERIZATION WITH Beatrix Fetters;  Surgeon: Burnell Blanks, MD;  Location: [facility] C[facility];  Service: Cardiovascular;  Laterality: N/A;   LUMBAR LAMINECTOMY/DECOMPRESSION MICRODISCECTOMY Right 06/17/2013   Procedure: LUMBAR LAMINECTOMY MICRODISCECTOMY L4-L5 RIGHT EXCISION OF SYNOVIAL CYST RIGHT   (1 LEVEL) RIGHT PARTIAL FACETECTOMY;  Surgeon: Tobi Bastos, MD;  Location: [facility];  Service: Orthopedics;  Laterality: Right;   MYELOGRAM  04/06/13   lumbar, Dr Gladstone Lighter   ORBITAL LESION EXCISION Right 09/01/2018   Procedure: ORBITOTOMY WITHOUT BONE FLAP WITH REMOVAL OF LESION RIGHT;  Surgeon: Karle Starch, MD;  Location: [place];  Service: Ophthalmology;  Laterality: Right;   PACEMAKER IMPLANT N/A 09/10/2017   Procedure: PACEMAKER IMPLANT;  Surgeon: Deboraha Sprang, MD;  Location: [place] [facility];  Service: Cardiovascular;  Laterality: N/A;   PANENDOSCOPY      PERIPHERAL VASCULAR BALLOON ANGIOPLASTY Left 03/16/2021   Procedure: PERIPHERAL VASCULAR BALLOON ANGIOPLASTY;  Surgeon: [name], MD;  Location: [place] [facility];  Service: Cardiovascular;  Laterality: Left;  subclavian  vein   PORTACATH PLACEMENT N/A 03/06/2017   Procedure: INSERTION PORT-A-CATH AND ASPIRATE SEROMA LEFT AXILLA;  Surgeon: Fanny Skates, MD;  Location: [place];  Service: General;  Laterality: N/A;   RIGHT/LEFT HEART CATH AND CORONARY/GRAFT ANGIOGRAPHY N/A 07/28/2017   Procedure: RIGHT/LEFT HEART CATH AND CORONARY/GRAFT ANGIOGRAPHY;  Surgeon: Sherren Mocha, MD;  Location: [place] [facility] LAB;  Service: Cardiovascular;  Laterality: N/A;   SHOULDER SURGERY Right 08/2010   screws placed; "tendons tore off"   SKIN CANCER EXCISION Right    "neck"   TONSILLECTOMY  ~ [place] Left 06/29/2019   Procedure: TOTAL KNEE ARTHROPLASTY;  Surgeon: Paralee Cancel, MD;  Location: [facility];  Service: Orthopedics;  Laterality: Left;  70 mins   TRANSCATHETER AORTIC VALVE REPLACEMENT, TRANSFEMORAL N/A 09/09/2017   Procedure:  TRANSCATHETER AORTIC VALVE REPLACEMENT, TRANSFEMORAL;  Surgeon: [place] Blanks, MD;  Location: [place];  Service: Open Heart Surgery;  Laterality: N/A;   UPPER EXTREMITY VENOGRAPHY Left 02/15/2021   Procedure: CENTRAL VENO;  Surgeon: [name], MD;  Location: [place] [facility];  Service: Cardiovascular;  Laterality: Left;   UPPER EXTREMITY VENOGRAPHY N/A 03/16/2021   Procedure: UPPER EXTREMITY VENOGRAPHY;  Surgeon: [name], MD;  Location: [place] [facility];  Service: Cardiovascular;  Laterality: N/A;   UPPER GI ENDOSCOPY  2013   Gastritis; Dr Carlean Purl   VASECTOMY      Current Meds  Medication Sig   acetaminophen (TYLENOL) 325 MG tablet Take 650 mg by mouth at bedtime as needed for moderate pain or headache.   allopurinol (ZYLOPRIM) 300 MG tablet Take 450 mg by mouth daily.   aluminum hydroxide-magnesium carbonate (GAVISCON)  95-358 MG/15ML SUSP Take 15 mLs by mouth as needed for indigestion or heartburn.   amLODipine (NORVASC) 5 MG tablet TAKE 1 TABLET BY MOUTH TWICE A DAY   apixaban (ELIQUIS) 5 MG TABS tablet Take 1 tablet (5 mg total) by mouth 2 (two) times daily.   atorvastatin (LIPITOR) 80 MG tablet TAKE ONE TABLET BY MOUTH AT BEDTIME   azelastine (ASTELIN) 0.1 % nasal spray Place 2 sprays into both nostrils at bedtime as needed for rhinitis or allergies. (Patient taking differently: Place 2 sprays into both nostrils 2 (two) times daily as needed for rhinitis or allergies.)   dapagliflozin propanediol (FARXIGA) 10 MG TABS tablet Take 10 mg by mouth daily.   esomeprazole (NEXIUM) 40 MG capsule Take 1 capsule (40 mg total) by mouth daily.   eye wash (,SODIUM/POTASSIUM/SOD CHLORIDE,) SOLN Place 1 drop into both eyes at bedtime. [facility] Soothing Eye Wash   ezetimibe (ZETIA) 10 MG tablet TAKE 1 TABLET BY MOUTH EVERY DAY   fenofibrate 160 MG tablet TAKE 1 TABLET BY MOUTH EVERY DAY   folic acid (FOLVITE) 1 MG tablet TAKE 2 TABLETS BY MOUTH EVERY DAY   FREESTYLE LITE test strip USE TO TEST BLOOD SUGAR ONCE A DAY. DX CODE: E11.9   glimepiride (AMARYL) 1 MG tablet TAKE 1 TABLET BY MOUTH DAILY WITH BREAKFAST.   KLOR-CON M20 20 MEQ tablet TAKE 2 TABLETS (40 MEQ TOTAL) BY MOUTH 2 (TWO) TIMES DAILY.   Lancets (FREESTYLE) lancets USE TWICE A DAY TO CHECK BLOOD SUGAR. DX E11.9   Menthol-Camphor (ICY HOT ADVANCED PAIN RELIEF EX) Apply 1 application topically daily. Roll-on   metFORMIN (GLUCOPHAGE) 1000 MG tablet TAKE 1 & 1/2 TABLETS BY MOUTH EVERY MORNING AND 1 TABLET IN THE EVENING.   metoprolol tartrate (LOPRESSOR) 25 MG tablet TAKE ONE-HALF TABLET BY MOUTH TWICE DAILY   nitroGLYCERIN (NITROSTAT) 0.4 MG SL tablet PLACE 1 TABLET (0.4 MG TOTAL) UNDER THE TONGUE EVERY 5 (FIVE) MINUTES AS NEEDED FOR CHEST PAIN.   polycarbophil (FIBERCON) 625 MG tablet Take 625 mg by mouth daily as needed for moderate constipation.   pregabalin  (LYRICA) 150 MG capsule TAKE 1 CAPSULE BY MOUTH TWICE A DAY   psyllium (METAMUCIL) 58.6 % powder Take 1 packet by mouth daily as needed (constipation).   torsemide (DEMADEX) 20 MG tablet Take 20 mg by mouth daily.   VASCEPA 1 g capsule TAKE 2 CAPSULES BY MOUTH TWICE A DAY    Allergies  Allergen Reactions   Benazepril Swelling    angioedema; he is not a candidate for any angiotensin receptor blockers because of this significant allergic reaction. Because of  a history of documented adverse serious drug reaction;Medi Alert bracelet  is recommended   Hctz [Hydrochlorothiazide] Anaphylaxis and Swelling    Tongue and lip swelling    Aspirin Other (See Comments)    Gastritis, cant take 325 Mg aspirin    Lactose Intolerance (Gi) Nausea And Vomiting   Review of Systems negative except from HPI and PMH  Physical Exam BP 124/60 (BP Location: Right Arm, Patient Position: Sitting, Cuff Size: Normal)   Pulse 80   Ht 6' (1.829 m)   SpO2 96%   BMI 33.23 kg/m  Well developed and well nourished in no acute distress HENT normal Neck supple with JVP-flat Clear Device pocket well healed; without hematoma or erythema.  There is no tethering  Regular rate and rhythm, no  murmur Abd-soft with active BS No Clubbing cyanosis significant left arm edema Skin-warm and dry lateralization left greater than right over the chest A & Oriented  Grossly normal sensory and motor function  ECG sinus with P-synchronous/ AV  pacing  18/12/38    Estimated Creatinine Clearance: 55.1 mL/min (A) (by C-G formula based on SCr of 1.33 mg/dL (H)).   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   Venoplasty failed.  It is unlikely that there will be a strategy to ameliorate his symptoms apart from supportive care.  I doubt there would be benefit to lead extraction although in conversations after the patient left with Dr. [name] Cousin he has seen some improvement following extraction; moreover, apparently there was some experience with stenting at the time of lead extraction, stenting, otherwise frowned upon because of jailing the lead  He could as part of that process received a leadless pacemaker  For now would continue anticoagulation I did suggest that he try a sleeve early in the morning when his arm is smallest to see if he can manage the swelling that way.    Current medicines are reviewed at length with the patient today .  The patient does not have concerns regarding medicines.

## 2021-05-04 ENCOUNTER — Ambulatory Visit (INDEPENDENT_AMBULATORY_CARE_PROVIDER_SITE_OTHER): Payer: Medicare Other

## 2021-05-04 DIAGNOSIS — I442 Atrioventricular block, complete: Secondary | ICD-10-CM

## 2021-05-04 LAB — CUP PACEART REMOTE DEVICE CHECK
Battery Remaining Longevity: 25 mo
Battery Voltage: 2.92 V
Brady Statistic AP VP Percent: 1.92 %
Brady Statistic AP VS Percent: 0 %
Brady Statistic AS VP Percent: 97.05 %
Brady Statistic AS VS Percent: 1.04 %
Brady Statistic RA Percent Paced: 2.28 %
Brady Statistic RV Percent Paced: 98.96 %
Date Time Interrogation Session: 20221103190319
Implantable Lead Implant Date: 20190313
Implantable Lead Implant Date: 20190313
Implantable Lead Location: 753859
Implantable Lead Location: 753860
Implantable Lead Model: 3830
Implantable Lead Model: 5076
Implantable Pulse Generator Implant Date: 20190313
Lead Channel Impedance Value: 285 Ohm
Lead Channel Impedance Value: 323 Ohm
Lead Channel Impedance Value: 399 Ohm
Lead Channel Impedance Value: 418 Ohm
Lead Channel Pacing Threshold Amplitude: 1 V
Lead Channel Pacing Threshold Amplitude: 1 V
Lead Channel Pacing Threshold Pulse Width: 0.4 ms
Lead Channel Pacing Threshold Pulse Width: 0.4 ms
Lead Channel Sensing Intrinsic Amplitude: 1.375 mV
Lead Channel Sensing Intrinsic Amplitude: 1.375 mV
Lead Channel Sensing Intrinsic Amplitude: 2.5 mV
Lead Channel Sensing Intrinsic Amplitude: 5.5 mV
Lead Channel Setting Pacing Amplitude: 2 V
Lead Channel Setting Pacing Amplitude: 2.5 V
Lead Channel Setting Pacing Pulse Width: 1 ms
Lead Channel Setting Sensing Sensitivity: 1.2 mV

## 2021-05-08 ENCOUNTER — Ambulatory Visit (INDEPENDENT_AMBULATORY_CARE_PROVIDER_SITE_OTHER): Payer: Medicare Other | Admitting: Dermatology

## 2021-05-08 ENCOUNTER — Other Ambulatory Visit: Payer: Self-pay

## 2021-05-08 DIAGNOSIS — L578 Other skin changes due to chronic exposure to nonionizing radiation: Secondary | ICD-10-CM | POA: Diagnosis not present

## 2021-05-08 DIAGNOSIS — L821 Other seborrheic keratosis: Secondary | ICD-10-CM

## 2021-05-08 DIAGNOSIS — I25810 Atherosclerosis of coronary artery bypass graft(s) without angina pectoris: Secondary | ICD-10-CM

## 2021-05-08 DIAGNOSIS — L570 Actinic keratosis: Secondary | ICD-10-CM | POA: Diagnosis not present

## 2021-05-08 DIAGNOSIS — Z85828 Personal history of other malignant neoplasm of skin: Secondary | ICD-10-CM

## 2021-05-08 NOTE — Progress Notes (Signed)
Follow-Up Visit   Subjective  Richard [name] is a 80 y.o. male who presents for the following: Follow-up (Patient here today for 3 month follow up on biopsies proven scc. ). They were treated with EDC at time of biopsies. He also has h/o Aks.   The following portions of the chart were reviewed this encounter and updated as appropriate:      Review of Systems: No other skin or systemic complaints except as noted in HPI or Assessment and Plan.   Objective  Well appearing patient in no apparent distress; mood and affect are within normal limits.  A focused examination was performed including face, ears, arms, hands, legs, knee, ankles . Relevant physical exam findings are noted in the Assessment and Plan.  right temple adjacent to scar x 1, right mid cheek x 5, right zygoma x 1, left ear helix x 2, left temple x 2 , left zygoma x 1, left upper forehead x 3, left cheek x 5, left inferior mandible x 1, right arm x 9, right hand dorsum x 3, left popliteal x 1 (34) Erythematous thin papules/macules with gritty scale.   Assessment & Plan  Actinic keratosis (34) right temple adjacent to scar x 1, right mid cheek x 5, right zygoma x 1, left ear helix x 2, left temple x 2 , left zygoma x 1, left upper forehead x 3, left cheek x 5, left inferior mandible x 1, right arm x 9, right hand dorsum x 3, left popliteal x 1  Actinic keratoses are precancerous spots that appear secondary to cumulative UV radiation exposure/sun exposure over time. They are chronic with expected duration over 1 year. A portion of actinic keratoses will progress to squamous cell carcinoma of the skin. It is not possible to reliably predict which spots will progress to skin cancer and so treatment is recommended to prevent development of skin cancer.  Recommend daily broad spectrum sunscreen SPF 30+ to sun-exposed areas, reapply every 2 hours as needed.  Recommend staying in the shade or wearing long sleeves, sun glasses  (UVA+UVB protection) and wide brim hats (4-inch brim around the entire circumference of the hat). Call for new or changing lesions.  Destruction of lesion - right temple adjacent to scar x 1, right mid cheek x 5, right zygoma x 1, left ear helix x 2, left temple x 2 , left zygoma x 1, left upper forehead x 3, left cheek x 5, left inferior mandible x 1, right arm x 9, right hand dorsum x 3, left popliteal x 1  Destruction method: cryotherapy   Informed consent: discussed and consent obtained   Lesion destroyed using liquid nitrogen: Yes   Region frozen until ice ball extended beyond lesion: Yes   Outcome: patient tolerated procedure well with no complications   Post-procedure details: wound care instructions given   Additional details:  Prior to procedure, discussed risks of blister formation, small wound, skin dyspigmentation, or rare scar following cryotherapy. Recommend Vaseline ointment to treated areas while healing.   Seborrheic Keratoses - Stuck-on, waxy, tan-brown papules and/or plaques  - Benign-appearing - Discussed benign etiology and prognosis. - Observe - Call for any changes  History of Squamous Cell Carcinoma of the Skin - No evidence of recurrence today right zygoma, left posterior ankle, and left popliteal treated with [facility] 2022 - Recommend regular full body skin exams - Recommend daily broad spectrum sunscreen SPF 30+ to sun-exposed areas, reapply every 2 hours as needed.  - Call  if any new or changing lesions are noted between office visits  Actinic Damage - Severe, confluent actinic changes with pre-cancerous actinic keratoses  - Severe, chronic, not at goal, secondary to cumulative UV radiation exposure over time - diffuse scaly erythematous macules and papules with underlying dyspigmentation - Discussed Prescription "Field Treatment" for Severe, Chronic Confluent Actinic Changes with Pre-Cancerous Actinic Keratoses Field treatment involves treatment of an entire  area of skin that has confluent Actinic Changes (Sun/ Ultraviolet light damage) and PreCancerous Actinic Keratoses by method of PhotoDynamic Therapy (PDT) and/or prescription Topical Chemotherapy agents such as 5-fluorouracil, 5-fluorouracil/calcipotriene, and/or imiquimod.  The purpose is to decrease the number of clinically evident and subclinical PreCancerous lesions to prevent progression to development of skin cancer by chemically destroying early precancer changes that may or may not be visible.  It has been shown to reduce the risk of developing skin cancer in the treated area. As a result of treatment, redness, scaling, crusting, and open sores may occur during treatment course. One or more than one of these methods may be used and may have to be used several times to control, suppress and eliminate the PreCancerous changes. Discussed treatment course, expected reaction, and possible side effects. - Recommend daily broad spectrum sunscreen SPF 30+ to sun-exposed areas, reapply every 2 hours as needed.  - Staying in the shade or wearing long sleeves, sun glasses (UVA+UVB protection) and wide brim hats (4-inch brim around the entire circumference of the hat) are also recommended. - Call for new or changing lesions. - Will schedule photodynamic therapy for January and February to face and arms, two treatments each site   Return for PDT for face and arms in january and february , Dr. Nicole Kindred follow up 6 month. I, Ruthell Rummage, CMA, am acting as scribe for Brendolyn Patty, MD.  Documentation: I have reviewed the above documentation for accuracy and completeness, and I agree with the above.  Brendolyn Patty MD

## 2021-05-08 NOTE — Patient Instructions (Addendum)
Actinic keratoses are precancerous spots that appear secondary to cumulative UV radiation exposure/sun exposure over time. They are chronic with expected duration over 1 year. A portion of actinic keratoses will progress to squamous cell carcinoma of the skin. It is not possible to reliably predict which spots will progress to skin cancer and so treatment is recommended to prevent development of skin cancer.  Recommend daily broad spectrum sunscreen SPF 30+ to sun-exposed areas, reapply every 2 hours as needed.  Recommend staying in the shade or wearing long sleeves, sun glasses (UVA+UVB protection) and wide brim hats (4-inch brim around the entire circumference of the hat). Call for new or changing lesions.   Cryotherapy Aftercare  Wash gently with soap and water everyday.   Apply Vaseline and Band-Aid daily until healed.    Photodynamic Therapy/Blue Light Therapy for face schedule in January and February   Actinic keratoses are the dry, red scaly spots on the skin caused by sun damage. A portion of these spots can turn into skin cancer with time, and treating them can help prevent development of skin cancer.   Treatment of these spots requires removal of the defective skin cells. There are various ways to remove actinic keratoses, including freezing with liquid nitrogen, treatment with creams, or treatment with a blue light procedure in the office.   Photodynamic Therapy (PDT), also known as "blue light therapy" is an in office procedure used to treat actinic keratoses. It works by targeting precancerous cells. After treatment, these cells peel off and are replaced by healthy ones.   For your phototherapy appointment, you will have two appointments on the day of your treatment. The first appointment will be to apply a cream to the treatment area. You will leave this cream on for 1-2 hours depending on the area being treated. The second appointment will be to shine a blue light on the area for  16 minutes to kill off the precancer cells. It is common to experience a burning sensation during the treatment.  After your treatment, it will be important to keep the treated areas of skin out of the sun completely for 48-72 hours (2-3 days) to prevent having a reaction.   Common side effects include: - Burning or stinging, which may be severe and can last up to 24-72 hours after your treatment - Scaling and crusting which may last up to 2 weeks - Redness, swelling and/or peeling which can last up to 4 weeks  To Care for Your Skin After PDT/Blue Light Therapy: - Wash with soap, water and shampoo as normal. - If needed, you can use cold compresses (e.g. ice packs) for comfort - If okay with your primary care doctor, you may use analgesics such as acetaminophen (tylenol) every 4-6 hours, not to exceed recommended dose - You may apply Cerave Healing Ointment, Vaseline or Aquaphor as needed - If you have a lot of swelling you may take a Benadryl to help with this (this may cause drowsiness), not to exceed recommended dose. This may increase the risk of falls in people over 65 and may slow reaction time while driving, so it is not recommended to take before driving or operating machinery. - Sun Precautions - Wear a wide brim hat for the next week if outside  - Wear a sunblock with zinc or titanium dioxide at least SPF 50 daily  If you have any questions or concerns, please call the office and ask to speak with a nurse.   --------------------------------------------------------------------------------------------------------------  If you have any questions or concerns for your doctor, please call our main line at [phone] and press option 4 to reach your doctor's medical assistant. If no one answers, please leave a voicemail as directed and we will return your call as soon as possible. Messages left after 4 pm will be answered the following business day.   You may also send [place] a  message via [place]. We typically respond to MyChart messages within 1-2 business days.  For prescription refills, please ask your pharmacy to contact our office. Our fax number is [phone].  If you have an urgent issue when the clinic is closed that cannot wait until the next business day, you can page your doctor at the number below.    Please note that while we do our best to be available for urgent issues outside of office hours, we are not available 24/7.   If you have an urgent issue and are unable to reach [place], you may choose to seek medical care at your doctor's office, retail clinic, urgent care center, or emergency room.  If you have a medical emergency, please immediately call 911 or go to the emergency department.  Pager Numbers  - Dr. Nehemiah Massed: [phone]  - Dr. Laurence Ferrari: [phone]  - Dr. Nicole Kindred: [phone]  In the event of inclement weather, please call our main line at [phone] for an update on the status of any delays or closures.  Dermatology Medication Tips: Please keep the boxes that topical medications come in in order to help keep track of the instructions about where and how to use these. Pharmacies typically print the medication instructions only on the boxes and not directly on the medication tubes.   If your medication is too expensive, please contact our office at [phone] option 4 or send [place] a message through [place].   We are unable to tell what your co-pay for medications will be in advance as this is different depending on your insurance coverage. However, we may be able to find a substitute medication at lower cost or fill out paperwork to get insurance to cover a needed medication.   If a prior authorization is required to get your medication covered by your insurance company, please allow [place] 1-2 business days to complete this process.  Drug prices often vary depending on where the prescription is filled and some pharmacies may offer  cheaper prices.  The website www.goodrx.com contains coupons for medications through different pharmacies. The prices here do not account for what the cost may be with help from insurance (it may be cheaper with your insurance), but the website can give you the price if you did not use any insurance.  - You can print the associated coupon and take it with your prescription to the pharmacy.  - You may also stop by our office during regular business hours and pick up a GoodRx coupon card.  - If you need your prescription sent electronically to a different pharmacy, notify our office through [facility] or by phone at [phone] option 4.

## 2021-05-09 NOTE — Progress Notes (Signed)
Remote pacemaker transmission.   

## 2021-05-11 NOTE — Addendum Note (Signed)
Addended by: Maren Beach, Claudis Giovanelli A on: 05/11/2021 02:25 PM   Modules accepted: Orders

## 2021-05-15 ENCOUNTER — Other Ambulatory Visit: Payer: Self-pay | Admitting: Family Medicine

## 2021-05-15 ENCOUNTER — Telehealth: Payer: Self-pay | Admitting: Internal Medicine

## 2021-05-15 DIAGNOSIS — G609 Hereditary and idiopathic neuropathy, unspecified: Secondary | ICD-10-CM

## 2021-05-15 NOTE — Telephone Encounter (Addendum)
Spoke with pt and advised per Dr Olin Pia office note from 05/01/2021 he has spoken with Dr Lovena Le re: possible lead extraction.  Pt advised Dr Caryl Comes is out of the office until 05/28/2021 and RN is currently unaware if a decision on how to best help the patient has been reached. Will forward to Dr Caryl Comes to address once he has returned to the office.  Pt verbalizes understanding and is agreeable with current plan.

## 2021-05-15 NOTE — Telephone Encounter (Signed)
Richard [name] is calling stating Dr. Caryl Comes told him at his last visit he would be contacting him to let him know the plan on what he is going to do in regards to this blockage. He is requesting a callback to discuss it due to not hearing back yet. He reports he is having swelling in his left arm during the day that is twice the size of his right arm. It goes down some during the night, but swells back up everyday. Please advise.

## 2021-05-15 NOTE — Telephone Encounter (Signed)
Requesting: Lyrica 150mg   Contract: None UDS: None Last Visit: 01/04/2021 Next Visit: None Last Refill: 02/08/2021 #180 and 0rf  Please Advise

## 2021-05-21 ENCOUNTER — Other Ambulatory Visit: Payer: Self-pay

## 2021-05-21 ENCOUNTER — Ambulatory Visit (INDEPENDENT_AMBULATORY_CARE_PROVIDER_SITE_OTHER): Payer: Medicare Other | Admitting: Pulmonary Disease

## 2021-05-21 ENCOUNTER — Encounter: Payer: Self-pay | Admitting: Pulmonary Disease

## 2021-05-21 DIAGNOSIS — I25810 Atherosclerosis of coronary artery bypass graft(s) without angina pectoris: Secondary | ICD-10-CM

## 2021-05-21 DIAGNOSIS — G4733 Obstructive sleep apnea (adult) (pediatric): Secondary | ICD-10-CM

## 2021-05-21 NOTE — Progress Notes (Signed)
   Subjective:    Patient ID: Richard [name], male    DOB: 1940/07/13, 80 y.o.   MRN: [id number]  HPI  80 yo man For FU of obstructive sleep apnea.   He got a new CPAP machine in 10/2015   PMH -  diffuse large B-cell lymphoma and underwent chemotherapy (ennever) , in remission severe AS s/p TAVR  & PPM.   Annual follow-up for OSA. No problems with mask or pressure.  He sleeps well. He has been found to have left upper extremity swelling which is attributed to subclavian vein stenosis due to pacemaker wire.  Unfortunately angioplasty was not very helpful.  He is waiting for EP recommendations.  PPM wire will have to be changed.  He has a Port-A-Cath on the other side He is compliant with his CPAP machine, was told by DME that machine has to be changed but working well at his end  Significant tests/ events reviewed  2008 PSG RDI 48/h ONO on CPAP /RA 08/2014 >> no desatn - dc O2  Review of Systems neg for any significant sore throat, dysphagia, itching, sneezing, nasal congestion or excess/ purulent secretions, fever, chills, sweats, unintended wt loss, pleuritic or exertional cp, hempoptysis, orthopnea pnd or change in chronic leg swelling. Also denies presyncope, palpitations, heartburn, abdominal pain, nausea, vomiting, diarrhea or change in bowel or urinary habits, dysuria,hematuria, rash, arthralgias, visual complaints, headache, numbness weakness or ataxia.     Objective:   Physical Exam  Gen. Pleasant, obese, in no distress ENT - no lesions, no post nasal drip Neck: No JVD, no thyromegaly, no carotid bruits Lungs: no use of accessory muscles, no dullness to percussion, decreased without rales or rhonchi  Cardiovascular: Rhythm regular, heart sounds  normal, no murmurs or gallops, no peripheral edema Musculoskeletal: No deformities, no cyanosis or clubbing , no tremors, left upper extremity swelling        Assessment & Plan:     Cardiovascular benefits of OSA treatment  were discussed

## 2021-05-21 NOTE — Patient Instructions (Signed)
CPAP is working well on current settings 

## 2021-05-21 NOTE — Assessment & Plan Note (Signed)
CPAP download was reviewed which shows excellent compliance on 15 cm with good control of events.  He has a large leak but this does not seem to bother him.  I will asked him to tighten the straps but will not make more efforts to fix this CPAP is only helped improve his daytime somnolence and fatigue.  I reassured him that his CPAP machine is working well and he need not replace it at this time but certainly should not develop any problems, he will be eligible by insurance to replace this  Weight loss encouraged, compliance with goal of at least 4-6 hrs every night is the expectation. Advised against medications with sedative side effects Cautioned against driving when sleepy - understanding that sleepiness will vary on a day to day basis

## 2021-05-22 ENCOUNTER — Ambulatory Visit: Payer: Medicare Other | Admitting: Vascular Surgery

## 2021-05-28 ENCOUNTER — Encounter: Payer: Self-pay | Admitting: Family

## 2021-05-28 ENCOUNTER — Telehealth: Payer: Self-pay | Admitting: *Deleted

## 2021-05-28 ENCOUNTER — Inpatient Hospital Stay (HOSPITAL_BASED_OUTPATIENT_CLINIC_OR_DEPARTMENT_OTHER): Payer: Medicare Other | Admitting: Family

## 2021-05-28 ENCOUNTER — Inpatient Hospital Stay: Payer: Medicare Other

## 2021-05-28 ENCOUNTER — Other Ambulatory Visit: Payer: Self-pay

## 2021-05-28 ENCOUNTER — Inpatient Hospital Stay: Payer: Medicare Other | Attending: Hematology & Oncology

## 2021-05-28 VITALS — BP 110/52 | HR 67 | Temp 97.6°F | Resp 20 | Wt 252.0 lb

## 2021-05-28 DIAGNOSIS — D508 Other iron deficiency anemias: Secondary | ICD-10-CM | POA: Diagnosis not present

## 2021-05-28 DIAGNOSIS — Z8572 Personal history of non-Hodgkin lymphomas: Secondary | ICD-10-CM | POA: Diagnosis not present

## 2021-05-28 DIAGNOSIS — C8332 Diffuse large B-cell lymphoma, intrathoracic lymph nodes: Secondary | ICD-10-CM

## 2021-05-28 DIAGNOSIS — D519 Vitamin B12 deficiency anemia, unspecified: Secondary | ICD-10-CM

## 2021-05-28 DIAGNOSIS — D500 Iron deficiency anemia secondary to blood loss (chronic): Secondary | ICD-10-CM

## 2021-05-28 DIAGNOSIS — D510 Vitamin B12 deficiency anemia due to intrinsic factor deficiency: Secondary | ICD-10-CM | POA: Diagnosis not present

## 2021-05-28 LAB — CBC WITH DIFFERENTIAL (CANCER CENTER ONLY)
Abs Immature Granulocytes: 0.12 10*3/uL — ABNORMAL HIGH (ref 0.00–0.07)
Basophils Absolute: 0 10*3/uL (ref 0.0–0.1)
Basophils Relative: 1 %
Eosinophils Absolute: 0.2 10*3/uL (ref 0.0–0.5)
Eosinophils Relative: 2 %
HCT: 37.7 % — ABNORMAL LOW (ref 39.0–52.0)
Hemoglobin: 12.4 g/dL — ABNORMAL LOW (ref 13.0–17.0)
Immature Granulocytes: 2 %
Lymphocytes Relative: 23 %
Lymphs Abs: 1.9 10*3/uL (ref 0.7–4.0)
MCH: 29.9 pg (ref 26.0–34.0)
MCHC: 32.9 g/dL (ref 30.0–36.0)
MCV: 90.8 fL (ref 80.0–100.0)
Monocytes Absolute: 0.5 10*3/uL (ref 0.1–1.0)
Monocytes Relative: 6 %
Neutro Abs: 5.4 10*3/uL (ref 1.7–7.7)
Neutrophils Relative %: 66 %
Platelet Count: 205 10*3/uL (ref 150–400)
RBC: 4.15 MIL/uL — ABNORMAL LOW (ref 4.22–5.81)
RDW: 15.2 % (ref 11.5–15.5)
WBC Count: 8.1 10*3/uL (ref 4.0–10.5)
nRBC: 0 % (ref 0.0–0.2)

## 2021-05-28 LAB — CMP (CANCER CENTER ONLY)
ALT: 15 U/L (ref 0–44)
AST: 16 U/L (ref 15–41)
Albumin: 4.4 g/dL (ref 3.5–5.0)
Alkaline Phosphatase: 70 U/L (ref 38–126)
Anion gap: 12 (ref 5–15)
BUN: 29 mg/dL — ABNORMAL HIGH (ref 8–23)
CO2: 26 mmol/L (ref 22–32)
Calcium: 10.1 mg/dL (ref 8.9–10.3)
Chloride: 103 mmol/L (ref 98–111)
Creatinine: 1.51 mg/dL — ABNORMAL HIGH (ref 0.61–1.24)
GFR, Estimated: 46 mL/min — ABNORMAL LOW (ref >60)
Glucose, Bld: 150 mg/dL — ABNORMAL HIGH (ref 70–99)
Potassium: 3.4 mmol/L — ABNORMAL LOW (ref 3.5–5.1)
Sodium: 141 mmol/L (ref 135–145)
Total Bilirubin: 0.6 mg/dL (ref 0.3–1.2)
Total Protein: 6.7 g/dL (ref 6.5–8.1)

## 2021-05-28 LAB — FERRITIN: Ferritin: 1567 ng/mL — ABNORMAL HIGH (ref 24–336)

## 2021-05-28 LAB — IRON AND TIBC
Iron: 71 ug/dL (ref 42–163)
Saturation Ratios: 22 % (ref 20–55)
TIBC: 329 ug/dL (ref 202–409)
UIBC: 258 ug/dL (ref 117–376)

## 2021-05-28 LAB — VITAMIN B12: Vitamin B-12: 169 pg/mL — ABNORMAL LOW (ref 180–914)

## 2021-05-28 LAB — RETICULOCYTES
Immature Retic Fract: 28 % — ABNORMAL HIGH (ref 2.3–15.9)
RBC.: 4.16 MIL/uL — ABNORMAL LOW (ref 4.22–5.81)
Retic Count, Absolute: 62.8 10*3/uL (ref 19.0–186.0)
Retic Ct Pct: 1.5 % (ref 0.4–3.1)

## 2021-05-28 LAB — LACTATE DEHYDROGENASE: LDH: 172 U/L (ref 98–192)

## 2021-05-28 MED ORDER — SODIUM CHLORIDE 0.9% FLUSH
10.0000 mL | INTRAVENOUS | Status: DC | PRN
  Administered 2021-05-28: 08:00:00 10 mL

## 2021-05-28 MED ORDER — CYANOCOBALAMIN 1000 MCG/ML IJ SOLN
1000.0000 ug | Freq: Once | INTRAMUSCULAR | Status: AC
  Administered 2021-05-28: 09:00:00 1000 ug via INTRAMUSCULAR
  Filled 2021-05-28: qty 1

## 2021-05-28 MED ORDER — HEPARIN SOD (PORK) LOCK FLUSH 100 UNIT/ML IV SOLN
500.0000 [IU] | Freq: Once | INTRAVENOUS | Status: AC | PRN
  Administered 2021-05-28: 08:00:00 500 [IU]

## 2021-05-28 NOTE — Telephone Encounter (Signed)
Per 05/28/21 los  - gave upcoming appointments - confirmed

## 2021-05-28 NOTE — Progress Notes (Signed)
Hematology and Oncology Follow Up Visit  Richard [name] [id number] August 06, 1940 80 y.o. 05/28/2021   Principle Diagnosis:  Diffuse large cell non-Hodgkin's lymphoma (IPI = 3) - NOT "double hit" Pernicious anemia Iron deficiency secondary to bleeding   Past Therapy: R-CHOP - s/p cycle 8 - completed 08/2017   Current Therapy:        Vitamin B12 1 mg IM every month Xgeva 120 mg subcu q 3 months - next dose due in 05/2021 IV iron as indicated    Interim History:  Richard [name] is here today for follow-up and B 12 injection. He is doing well and states that he was able to see his EP doctor and that they plan to remove his pacemaker and replace with a wireless device.  He has had no fever, chills, n/v, cough, rash, dizziness, SOB, palpitations, abdominal pain or changes in bowel or bladder habits.  He had one episode of chest discomfort but states that resolved quickly without intervention and he feels it may have been GERD.  The neuropathy and mild fluid retention in his lower extremities is unchanged from baseline.  No falls or syncope to report.  He is eating well and staying properly hydrated. His weight is stable at 252 lbs.  He has been walking in his treadmill for exercise and plans to golf two days this week.   ECOG Performance Status: 1 - Symptomatic but completely ambulatory  Medications:  Allergies as of 05/28/2021       Reactions   Benazepril Swelling   angioedema; he is not a candidate for any angiotensin receptor blockers because of this significant allergic reaction. Because of a history of documented adverse serious drug reaction;Medi Alert bracelet  is recommended   Hctz [hydrochlorothiazide] Anaphylaxis, Swelling   Tongue and lip swelling   Aspirin Other (See Comments)   Gastritis, cant take 325 Mg aspirin    Lactose Intolerance (gi) Nausea And Vomiting        Medication List        Accurate as of May 28, 2021  8:33 AM. If you have any questions, ask  your nurse or doctor.          acetaminophen 325 MG tablet Commonly known as: TYLENOL Take 650 mg by mouth at bedtime as needed for moderate pain or headache.   allopurinol 300 MG tablet Commonly known as: ZYLOPRIM Take 450 mg by mouth daily.   aluminum hydroxide-magnesium carbonate 95-358 MG/15ML Susp Commonly known as: GAVISCON Take 15 mLs by mouth as needed for indigestion or heartburn.   amLODipine 5 MG tablet Commonly known as: NORVASC TAKE 1 TABLET BY MOUTH TWICE A DAY   apixaban 5 MG Tabs tablet Commonly known as: ELIQUIS Take 1 tablet (5 mg total) by mouth 2 (two) times daily.   atorvastatin 80 MG tablet Commonly known as: LIPITOR TAKE ONE TABLET BY MOUTH AT BEDTIME   azelastine 0.1 % nasal spray Commonly known as: ASTELIN Place 2 sprays into both nostrils at bedtime as needed for rhinitis or allergies. What changed: when to take this   dapagliflozin propanediol 10 MG Tabs tablet Commonly known as: FARXIGA Take 10 mg by mouth daily.   esomeprazole 40 MG capsule Commonly known as: NexIUM Take 1 capsule (40 mg total) by mouth daily.   eye wash Soln Place 1 drop into both eyes at bedtime. [facility] Soothing Eye Wash   ezetimibe 10 MG tablet Commonly known as: ZETIA TAKE 1 TABLET BY MOUTH EVERY DAY   fenofibrate  160 MG tablet TAKE 1 TABLET BY MOUTH EVERY DAY   folic acid 1 MG tablet Commonly known as: FOLVITE TAKE 2 TABLETS BY MOUTH EVERY DAY   freestyle lancets USE TWICE A DAY TO CHECK BLOOD SUGAR. DX E11.9   FREESTYLE LITE test strip Generic drug: glucose blood USE TO TEST BLOOD SUGAR ONCE A DAY. DX CODE: E11.9   glimepiride 1 MG tablet Commonly known as: AMARYL TAKE 1 TABLET BY MOUTH DAILY WITH BREAKFAST.   ICY HOT ADVANCED PAIN RELIEF EX Apply 1 application topically daily. Roll-on   Klor-Con M20 20 MEQ tablet Generic drug: potassium chloride SA TAKE 2 TABLETS (40 MEQ TOTAL) BY MOUTH 2 (TWO) TIMES DAILY.   metFORMIN 1000 MG  tablet Commonly known as: GLUCOPHAGE TAKE 1 & 1/2 TABLETS BY MOUTH EVERY MORNING AND 1 TABLET IN THE EVENING.   metoprolol tartrate 25 MG tablet Commonly known as: LOPRESSOR TAKE ONE-HALF TABLET BY MOUTH TWICE DAILY   nitroGLYCERIN 0.4 MG SL tablet Commonly known as: NITROSTAT PLACE 1 TABLET (0.4 MG TOTAL) UNDER THE TONGUE EVERY 5 (FIVE) MINUTES AS NEEDED FOR CHEST PAIN.   polycarbophil 625 MG tablet Commonly known as: FIBERCON Take 625 mg by mouth daily as needed for moderate constipation.   pregabalin 150 MG capsule Commonly known as: LYRICA TAKE 1 CAPSULE BY MOUTH TWICE A DAY   psyllium 58.6 % powder Commonly known as: METAMUCIL Take 1 packet by mouth daily as needed (constipation).   torsemide 20 MG tablet Commonly known as: DEMADEX Take 20 mg by mouth daily.   Vascepa 1 g capsule Generic drug: icosapent Ethyl TAKE 2 CAPSULES BY MOUTH TWICE A DAY        Allergies:  Allergies  Allergen Reactions   Benazepril Swelling    angioedema; he is not a candidate for any angiotensin receptor blockers because of this significant allergic reaction. Because of a history of documented adverse serious drug reaction;Medi Alert bracelet  is recommended   Hctz [Hydrochlorothiazide] Anaphylaxis and Swelling    Tongue and lip swelling    Aspirin Other (See Comments)    Gastritis, cant take 325 Mg aspirin    Lactose Intolerance (Gi) Nausea And Vomiting    Past Medical History, Surgical history, Social history, and Family History were reviewed and updated.  Review of Systems: All other 10 point review of systems is negative.   Physical Exam:  weight is 252 lb (114.3 kg). His oral temperature is 97.6 F (36.4 C). His blood pressure is 110/52 (abnormal) and his pulse is 67. His respiration is 20 and oxygen saturation is 93%.   Wt Readings from Last 3 Encounters:  05/28/21 252 lb (114.3 kg)  05/21/21 253 lb 3.2 oz (114.9 kg)  04/25/21 245 lb (111.1 kg)    Ocular: Sclerae  unicteric, pupils equal, round and reactive to light Ear-nose-throat: Oropharynx clear, dentition fair Lymphatic: No cervical or supraclavicular adenopathy Lungs no rales or rhonchi, good excursion bilaterally Heart regular rate and rhythm, no murmur appreciated Abd soft, nontender, positive bowel sounds MSK no focal spinal tenderness, no joint edema Neuro: non-focal, well-oriented, appropriate affect Breasts: Deferred   Lab Results  Component Value Date   WBC 8.1 05/28/2021   HGB 12.4 (L) 05/28/2021   HCT 37.7 (L) 05/28/2021   MCV 90.8 05/28/2021   PLT 205 05/28/2021   Lab Results  Component Value Date   FERRITIN 1,279 (H) 04/25/2021   IRON 75 04/25/2021   TIBC 335 04/25/2021   UIBC 260 04/25/2021  IRONPCTSAT 22 04/25/2021   Lab Results  Component Value Date   RETICCTPCT 1.5 05/28/2021   RBC 4.16 (L) 05/28/2021   RETICCTABS 52.1 11/22/2011   No results found for: Nils Pyle [facility] Lab Results  Component Value Date   IGA 159 03/09/2012   Lab Results  Component Value Date   ALBUMINELP 4.2 07/27/2018   MSPIKE Not Observed 07/27/2018     Chemistry      Component Value Date/Time   NA 139 04/25/2021 1336   NA 136 03/16/2019 0938   NA 144 06/27/2017 0857   K 4.6 04/25/2021 1336   K 3.9 06/27/2017 0857   CL 104 04/25/2021 1336   CL 103 06/27/2017 0857   CO2 26 04/25/2021 1336   CO2 26 06/27/2017 0857   BUN 26 (H) 04/25/2021 1336   BUN 34 (H) 03/16/2019 0938   BUN 12 06/27/2017 0857   CREATININE 1.33 (H) 04/25/2021 1336   CREATININE 1.65 (H) 04/18/2020 0956      Component Value Date/Time   CALCIUM 10.1 04/25/2021 1336   CALCIUM 9.2 06/27/2017 0857   ALKPHOS 57 04/25/2021 1336   ALKPHOS 128 (H) 06/27/2017 0857   AST 15 04/25/2021 1336   ALT 14 04/25/2021 1336   ALT 24 06/27/2017 0857   BILITOT 0.5 04/25/2021 1336       Impression and Plan: Richard [name] is a very pleasant 80 yo caucasian gentleman with diffuse large B-cell lymphoma  (not "double hit" lymphoma). He completed treatment in February 2019.  He received his B 12 injection today.  Iron studies are pending.  Xgeva due at next visit.  Follow-up in 1 month.  He can contact our office with any questions or concerns.   Lottie Dawson, NP 11/28/20228:33 AM

## 2021-05-28 NOTE — Patient Instructions (Signed)

## 2021-05-28 NOTE — Patient Instructions (Signed)
Vitamin B12 Deficiency Vitamin B12 deficiency means that your body does not have enough vitamin B12. The body needs this important vitamin: To make red blood cells. To make genes (DNA). To help the nerves work. If you do not have enough vitamin B12 in your body, you can have health problems, such as not having enough red blood cells in the blood (anemia). What are the causes? Not eating enough foods that contain vitamin B12. Not being able to take in (absorb) vitamin B12 from the food that you eat. Certain diseases. A condition in which the body does not make enough of a certain protein. This results in your body not taking in enough vitamin B12. Having a surgery in which part of the stomach or small intestine is taken out. Taking medicines that make it hard for the body to take in vitamin B12. These include: Heartburn medicines. Some medicines that are used to treat diabetes. What increases the risk? Being an older adult. Eating a vegetarian or vegan diet that does not include any foods that come from animals. Not eating enough foods that contain vitamin B12 while you are pregnant. Taking certain medicines. Having alcoholism. What are the signs or symptoms? In some cases, there are no symptoms. If the condition leads to too few blood cells or nerve damage, symptoms can occur, such as: Feeling weak or tired. Not being hungry. Losing feeling (numbness) or tingling in your hands and feet. Redness and burning of the tongue. Feeling sad (depressed). Confusion or memory problems. Trouble walking. If anemia is very bad, symptoms can include: Being short of breath. Being dizzy. Having a very fast heartbeat. How is this treated? Changing the way you eat and drink, such as: Eating more foods that contain vitamin B12. Drinking little or no alcohol. Getting vitamin B12 shots. Taking vitamin B12 supplements by mouth (orally). Your doctor will tell you the dose that is best for you. Follow  these instructions at home: Eating and drinking  Eat foods that come from animals and have a lot of vitamin B12 in them. These include: Meats and poultry. This includes beef, pork, chicken, turkey, and organ meats, such as liver. Seafood, such as clams, rainbow trout, salmon, tuna, and haddock. Eggs. Dairy foods such as milk, yogurt, and cheese. Eat breakfast cereals that have vitamin B12 added to them (are fortified). Check the label. The items listed above may not be a complete list of foods and beverages you can eat and drink. Contact a dietitian for more information. Alcohol use Do not drink alcohol if: Your doctor tells you not to drink. You are pregnant, may be pregnant, or are planning to become pregnant. If you drink alcohol: Limit how much you have to: 0-1 drink a day for women. 0-2 drinks a day for men. Know how much alcohol is in your drink. In the [place] one drink equals one 12 oz bottle of beer (355 mL), one 5 oz glass of wine (148 mL), or one 1 oz glass of hard liquor (44 mL). General instructions Get any vitamin B12 shots if told by your doctor. Take supplements only as told by your doctor. Follow the directions. Keep all follow-up visits. Contact a doctor if: Your symptoms come back. Your symptoms get worse or do not get better with treatment. Get help right away if: You have trouble breathing. You have a very fast heartbeat. You have chest pain. You get dizzy. You faint. These symptoms may be an emergency. Get help right away. Call 911.   Do not wait to see if the symptoms will go away. Do not drive yourself to the hospital. Summary Vitamin B12 deficiency means that your body is not getting enough of the vitamin. In some cases, there are no symptoms of this condition. Treatment may include making a change in the way you eat and drink, getting shots, or taking supplements. Eat foods that have vitamin B12 in them. This information is not intended to replace advice  given to you by your health care provider. Make sure you discuss any questions you have with your health care provider. Document Revised: 02/09/2021 Document Reviewed: 02/09/2021 Elsevier Patient Education  2022 [employer] Inc.  

## 2021-05-29 ENCOUNTER — Ambulatory Visit: Payer: Medicare Other | Admitting: Family

## 2021-05-29 ENCOUNTER — Ambulatory Visit: Payer: Medicare Other

## 2021-05-29 ENCOUNTER — Inpatient Hospital Stay: Payer: Medicare Other

## 2021-05-29 ENCOUNTER — Other Ambulatory Visit: Payer: Medicare Other

## 2021-05-29 DIAGNOSIS — M150 Primary generalized (osteo)arthritis: Secondary | ICD-10-CM | POA: Diagnosis not present

## 2021-05-29 DIAGNOSIS — E669 Obesity, unspecified: Secondary | ICD-10-CM | POA: Diagnosis not present

## 2021-05-29 DIAGNOSIS — G5793 Unspecified mononeuropathy of bilateral lower limbs: Secondary | ICD-10-CM | POA: Diagnosis not present

## 2021-05-29 DIAGNOSIS — Z6836 Body mass index (BMI) 36.0-36.9, adult: Secondary | ICD-10-CM | POA: Diagnosis not present

## 2021-05-29 DIAGNOSIS — M1A09X0 Idiopathic chronic gout, multiple sites, without tophus (tophi): Secondary | ICD-10-CM | POA: Diagnosis not present

## 2021-05-31 NOTE — Addendum Note (Signed)
Addended by: Maren Beach, Jerett Odonohue A on: 05/31/2021 04:52 PM   Modules accepted: Orders

## 2021-05-31 NOTE — Telephone Encounter (Signed)
Spoke with pt and advised of Dr Olin Pia recommendations as below:  The recommendation was to not to try and extract and also not to stent ( that would "jail" the lead betweeen the stent and the vessel wall  The best we can do is symptom management.  If he would like another opinion, we can try and arrange at [facility] or [place] or [facility]  Thanks  steve   Pt would like to move forward with another opinion either at [place] or [place].  Will forward to Dr Caryl Comes to assist in arranging consult.  Pt verbalizes understanding and agrees with current plan.

## 2021-06-06 ENCOUNTER — Other Ambulatory Visit: Payer: Self-pay | Admitting: Family Medicine

## 2021-06-11 ENCOUNTER — Telehealth: Payer: Self-pay | Admitting: Internal Medicine

## 2021-06-11 ENCOUNTER — Other Ambulatory Visit: Payer: Self-pay | Admitting: Family Medicine

## 2021-06-11 DIAGNOSIS — E781 Pure hyperglyceridemia: Secondary | ICD-10-CM

## 2021-06-11 DIAGNOSIS — R600 Localized edema: Secondary | ICD-10-CM

## 2021-06-11 NOTE — Telephone Encounter (Signed)
Returned call to Pt.  He is wanting to know where we are in the process of referring him to [facility].  Advised would discuss with Dr. Caryl Comes.

## 2021-06-11 NOTE — Telephone Encounter (Signed)
New Message:     The patient would like for Dr Olin Pia nurse to call him. This is concerning his referral to [facility].

## 2021-06-11 NOTE — Telephone Encounter (Signed)
Discussed with Dr. Caryl Comes.  Per Dr. Felicie Morn Pt to Dr. Salley Scarlet at [facility].  Pt advised and thanked nurse for follow up.  Referral entered.  Attempted to contact clinic to get fax information.  No answer.  Left message to call back this nurse directly.  Will follow up.

## 2021-06-12 NOTE — Telephone Encounter (Signed)
Confirmation fax received.  Referral complete

## 2021-06-12 NOTE — Telephone Encounter (Signed)
Pt has been referred to [facility]. See encounter dated 06/11/2021.

## 2021-06-12 NOTE — Telephone Encounter (Signed)
Call back received from Dr. Ammie Ferrier assistant.  Fax records to [phone]  Will fax records.

## 2021-06-19 ENCOUNTER — Ambulatory Visit (INDEPENDENT_AMBULATORY_CARE_PROVIDER_SITE_OTHER): Payer: Medicare Other | Admitting: Pharmacist

## 2021-06-19 DIAGNOSIS — I25118 Atherosclerotic heart disease of native coronary artery with other forms of angina pectoris: Secondary | ICD-10-CM

## 2021-06-19 DIAGNOSIS — E1122 Type 2 diabetes mellitus with diabetic chronic kidney disease: Secondary | ICD-10-CM

## 2021-06-19 DIAGNOSIS — E785 Hyperlipidemia, unspecified: Secondary | ICD-10-CM

## 2021-06-19 DIAGNOSIS — N183 Chronic kidney disease, stage 3 unspecified: Secondary | ICD-10-CM

## 2021-06-19 DIAGNOSIS — I10 Essential (primary) hypertension: Secondary | ICD-10-CM

## 2021-06-19 NOTE — Chronic Care Management (AMB) (Signed)
Chronic Care Management Pharmacy Note  06/19/2021 Name:  Richard [name] MRN:  [id number] DOB:  September 06, 1940  Summary: Patient is due to see PCP. Assisted with making appointment to see Dr Richard [name] 07/26/2021 Labs will be due - lipids and A1c If A1c remains below 6.5%, consider de-escalation of pharmacotherapy for diabetes by either stopping glimepiride or lowering dose of metformin to 532m twice a day. (Last eGFR was 46 (05/28/2021 - has CKD, CHF) Future treatment options if LDL and Tg remain above goals - change atorvastatin to rosuvastatin 419mor add PCSK9 agent (cost might be a barrier)  Subjective: Richard [name] an [age].o. year old male who is a primary patient of Richard [name].  The CCM team was consulted for assistance with disease management and care coordination needs.    Engaged with patient by telephone for follow up visit in response to provider referral for pharmacy case management and/or care coordination services.   Consent to Services:  The patient was given information about Chronic Care Management services, agreed to services, and gave verbal consent prior to initiation of services.  Please see initial visit note for detailed documentation.   Patient Care Team: LoCarollee HerterYvAlferd [name] as PCP - General (Family Medicine) Richard [name] as PCP - Cardiology (Cardiology) Richard [name] as PCP - Electrophysiology (Cardiology) Richard [name] (Ophthalmology) Richard [name] as Consulting Physician (Pulmonary Disease) Richard [name] as Consulting Physician (Oncology) Richard [name] as Consulting Physician (Cardiology) Richard [name] as Nurse Practitioner (Pulmonary Disease) [facility] (Dermatology) GiLatanya MaudlinMD as Consulting Physician (Orthopedic Surgery) [facility], CaEllyn HackTaLynelle SmokeRPCelestePharmacist)  Recent office visits: 07/27/2020 - PCP (Dr LoEtter SjogrenNo med changes.   Recent consult  visits: 05/01/2021 - Cardio (Dr KlCaryl Comesf/u afib, pacemaker and subclavian vein stenosis. Venoplast not successful. Consider other option. [facility] 03/13/2021 - Vasc Surgery (HStanford BreedMD) Seen for stenosis of left subclavian vein. Left subcalvian venoplasty scheduled for 03/16/2021 03/08/2021 - [facility]. Considering angioplasty to severe 85% subclavian vein stenosis at area of pacemaker wire entry. Consulting with Dr FiOneida Alarnd Dr KlCaryl Comes 03/08/2021 - Wound Center (Dr [place]Heber [place] 03[place]2021 [place]/23[place]20[place] 12/12/2020 - Ortho (Dr GiGladstone Lighterleft shoulder pain; swelling of left upper limb. No structural issues; referred to vascular surgeon 11/24/2020 - Cardio (Dr KlCaryl Comesf/u atrial fibrillation. no med changes.  H[facility] visits: [facility]2021 [facility]   Objective:  Lab Results  Component Value Date   CREATININE 1.51 (H) 05/28/2021   CREATININE 1.33 (H) 04/25/2021   CREATININE 1.44 (H) 03/27/2021    Lab Results  Component Value Date   HGBA1C 6.3 (H) 11/23/2020   Last diabetic Eye exam:  Lab Results  Component Value Date/Time   HMDIABEYEEXA No Retinopathy 04/05/2021 12:00 AM    Last diabetic Foot exam: No results found for: HMDIABFOOTEX      Component Value Date/Time   CHOL 162 11/23/2020 0828   TRIG 206 (H) 11/23/2020 0828   HDL 38 (L) 11/23/2020 0828   CHOLHDL 4.3 11/23/2020 0828   CHOLHDL 5 07/27/2020 0858   VLDL 44.0 (H) 11/24/2018 0815   LDLCALC 89 11/23/2020 0828   LDLCALC  04/18/2020 0956     Comment:     .  LDL cholesterol not calculated. Triglyceride levels greater than 400 mg/dL invalidate calculated LDL results. . Reference range: <100 . Desirable range <100 mg/dL for primary prevention;   <70 mg/dL for patients with CHD or diabetic patients   with > or = 2 CHD risk factors. [name] LDL-C is now calculated using the Martin-Hopkins  calculation, which is a validated novel method providing  better accuracy than the Friedewald equation in the  estimation of LDL-C.  [name] et al. [name]. 4970;263(78): 2061-2068  (http://education.[employer].com/faq/FAQ164)    LDLDIRECT 47.0 07/27/2020 0858    Hepatic Function Latest Ref Rng & Units 05/28/2021 04/25/2021 03/27/2021  Total Protein 6.5 - 8.1 g/dL 6.7 7.2 6.8  Albumin 3.5 - 5.0 g/dL 4.4 4.6 4.5  AST 15 - 41 U/L _0 ALT 0 - 44 U/L _1 Alk Phosphatase 38 - 126 U/L 70 57 54  Total Bilirubin 0.3 - 1.2 mg/dL 0.6 0.5 0.6  Bilirubin, Direct 0.00 - 0.40 mg/dL - - -    Lab Results  Component Value Date/Time   TSH 2.450 07/27/2018 03:24 PM   TSH 2.101 08/18/2017 06:08 AM   TSH 2.53 07/28/2015 10:04 AM   FREET4 0.87 06/21/2014 04:45 PM    CBC Latest Ref Rng & Units 05/28/2021 04/25/2021 03/27/2021  WBC 4.0 - 10.5 K/uL 8.1 9.0 8.7  Hemoglobin 13.0 - 17.0 g/dL 12.4(L) 13.1 12.8(L)  Hematocrit 39.0 - 52.0 % 37.7(L) 40.7 39.3  Platelets 150 - 400 K/uL 205 204 195    Lab Results  Component Value Date/Time   VD25OH 16.1 (L) 07/27/2018 03:24 PM    Clinical ASCVD: Yes  The ASCVD Risk score (Arnett DK, et al., 2019) failed to calculate for the following reasons:   The 2019 ASCVD risk score is only valid for ages 14 to 39   The patient has a prior MI or stroke diagnosis     Social History   Tobacco Use  Smoking Status Never  Smokeless Tobacco Never   BP Readings from Last 3 Encounters:  05/28/21 (!) 110/52  05/21/21 140/70  05/01/21 124/60   Pulse Readings from Last 3 Encounters:  05/28/21 67  05/21/21 77  05/01/21 80   Wt Readings from Last 3 Encounters:  05/28/21 252 lb (114.3 kg)  05/21/21 253 lb 3.2 oz (114.9 kg)  04/25/21 245 lb (111.1 kg)    Assessment: Review of patient past medical history, allergies, medications, health status, including  review of consultants reports, laboratory and other test data, was performed as part of comprehensive evaluation and provision of chronic care management services.   SDOH:  (Social Determinants of Health) assessments and interventions performed:    CCM Care Plan  Allergies  Allergen Reactions   Benazepril Swelling    angioedema; he is not a candidate for any angiotensin receptor blockers because of this significant allergic reaction. Because of a history of documented adverse serious drug reaction;Medi Alert bracelet  is recommended   Hctz [Hydrochlorothiazide] Anaphylaxis and Swelling    Tongue and lip swelling    Aspirin Other (See Comments)    Gastritis, cant take 325 Mg aspirin    Lactose Intolerance (Gi) Nausea And Vomiting    Medications Reviewed Today     Reviewed by Fabio Neighbors, LPN (Licensed Practical Nurse) on 05/28/21 at [place] List Status: <None>   Medication Order Taking? Sig Documenting Provider Last Dose Status Informant  acetaminophen (TYLENOL) 325 MG tablet [id number] Yes Take 650 mg by mouth at bedtime  as needed for moderate pain or headache. [provider] Taking Active Self  allopurinol (ZYLOPRIM) 300 MG tablet [id number] Yes Take 450 mg by mouth daily. [provider] Taking Active Self  aluminum hydroxide-magnesium carbonate (GAVISCON) 95-358 MG/15ML SUSP [id number] Yes Take 15 mLs by mouth as needed for indigestion or heartburn. [provider] Taking Active Self  amLODipine (NORVASC) 5 MG tablet [id number] Yes TAKE 1 TABLET BY MOUTH TWICE A DAY Hochrein, Jeneen Rinks, MD Taking Active Self  apixaban (ELIQUIS) 5 MG TABS tablet [id number] Yes Take 1 tablet (5 mg total) by mouth 2 (two) times daily. Minus Breeding, MD Taking Active Self           Med Note Wilmon Pali, MELISSA R   Wed Feb 14, 2021  1:21 PM)    atorvastatin (LIPITOR) 80 MG tablet [id number] Yes TAKE ONE TABLET BY MOUTH AT BEDTIME Minus Breeding, MD Taking Active Self   azelastine (ASTELIN) 0.1 % nasal spray [id number] Yes Place 2 sprays into both nostrils at bedtime as needed for rhinitis or allergies.  Patient taking differently: Place 2 sprays into both nostrils 2 (two) times daily as needed for rhinitis or allergies.   [name], DO Taking Active            Med Note [facility], CARLOS A   Tue May 01, 2021  3:36 PM)    dapagliflozin propanediol (FARXIGA) 10 MG TABS tablet [id number] Yes Take 10 mg by mouth daily. [provider] Taking Active Self  esomeprazole (NEXIUM) 40 MG capsule [id number] Yes Take 1 capsule (40 mg total) by mouth daily. [name], MD Taking Active Self  eye wash (,SODIUM/POTASSIUM/SOD CHLORIDE,) Bailey Mech [id number] Yes Place 1 drop into both eyes at bedtime. [place] [provider] Taking Active Self  ezetimibe (ZETIA) 10 MG tablet [id number] Yes TAKE 1 TABLET BY MOUTH EVERY Kelton Pillar, MD Taking Active Self  fenofibrate 160 MG tablet [id number] Yes TAKE 1 TABLET BY MOUTH EVERY DAY Roma Schanz R, DO Taking Active Self  folic acid (FOLVITE) 1 MG tablet [id number] Yes TAKE 2 TABLETS BY MOUTH EVERY DAY [name], MD Taking Active Self  FREESTYLE LITE test strip [id number] Yes USE TO TEST BLOOD SUGAR ONCE A DAY. DX CODE: E11.9 [name], DO Taking Active Self  glimepiride (AMARYL) 1 MG tablet [id number] Yes TAKE 1 TABLET BY MOUTH DAILY WITH BREAKFAST. [name], DO Taking Active Self  KLOR-CON M20 20 MEQ tablet [id number] Yes TAKE 2 TABLETS (40 MEQ TOTAL) BY MOUTH 2 (TWO) TIMES DAILY. Minus Breeding, MD Taking Active   Lancets (FREESTYLE) lancets [id number] Yes USE TWICE A DAY TO CHECK BLOOD SUGAR. DX E11.9 [name], DO Taking Active Self  Menthol-Camphor (ICY HOT ADVANCED PAIN RELIEF EX) [id number] Yes Apply 1 application topically daily. [place] [provider] Taking Active Self  metFORMIN (GLUCOPHAGE) 1000 MG tablet  [id number] Yes TAKE 1 & 1/2 TABLETS BY MOUTH EVERY MORNING AND 1 TABLET IN THE EVENING. Roma Schanz R, DO Taking Active   metoprolol tartrate (LOPRESSOR) 25 MG tablet [id number] Yes TAKE ONE-HALF TABLET BY MOUTH TWICE DAILY Minus Breeding, MD Taking Active   nitroGLYCERIN (NITROSTAT) 0.4 MG SL tablet [id number] Yes PLACE 1 TABLET (0.4 MG TOTAL) UNDER THE TONGUE EVERY 5 (FIVE) MINUTES AS NEEDED FOR CHEST PAIN. Minus Breeding, MD Taking Active   polycarbophil (FIBERCON) 625 MG tablet [id number] Yes Take 625 mg by mouth daily as needed  for moderate constipation. [provider] Taking Active Self  pregabalin (LYRICA) 150 MG capsule [id number] Yes TAKE 1 CAPSULE BY MOUTH TWICE A DAY Lowne Chase, Yvonne R, DO Taking Active   psyllium (METAMUCIL) 58.6 % powder [id number] Yes Take 1 packet by mouth daily as needed (constipation). [provider] Taking Active Self  torsemide (DEMADEX) 20 MG tablet [id number] Yes Take 20 mg by mouth daily. [provider] Taking Active Self  VASCEPA 1 g capsule [id number] Yes TAKE 2 CAPSULES BY MOUTH TWICE A DAY [name], DO Taking Active Self  Med List Note Henderson Newcomer, CPhT 06/10/13 5643): cpap machine 2 L of oxygen at night            Patient Active Problem List   Diagnosis Date Noted   Multiple superficial wounds with infection 01/04/2021   Cardiac pacemaker in situ 11/20/2020   S/P CABG x 4 11/20/2020   Ascending aortic aneurysm 11/20/2020   Senile purpura ([place]) 07/27/2020   Porokeratosis 06/20/2020   Pain due to onychomycosis of toenails of both feet 12/14/2019   Educated about COVID-19 virus infection 11/25/2019   Uncontrolled type 2 diabetes mellitus with hypoglycemia without coma ([place]) 08/10/2019   Hyperlipidemia associated with type 2 diabetes mellitus ([place]) 08/10/2019   S/P left TKA 06/29/2019   Peripheral neuropathy 07/27/2018   Uncontrolled type 2 diabetes mellitus with hyperglycemia ([place]) 05/13/2018    Diabetic peripheral neuropathy associated with type 2 diabetes mellitus ([place]) 05/13/2018   Pansinusitis 05/13/2018   Severe aortic stenosis    Complete heart block (HCC)    Paroxysmal atrial fibrillation (HCC)    Acute on chronic diastolic CHF (congestive heart failure), NYHA class 2 (HCC)    Left arm swelling    Status post transcatheter aortic valve replacement (TAVR) using bioprosthesis 09/09/2017   Demand ischemia of myocardium (HCC)    Typical atrial flutter ([place])    Acute on chronic diastolic heart failure ([place])    GIB (gastrointestinal bleeding) 08/16/2017   Hyperlipidemia 08/05/2017   NSTEMI (non-ST elevated myocardial infarction) ([place])    Aortic stenosis, severe 07/25/2017   Pancytopenia ([place]) 07/24/2017   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 ([place]) 02/27/2017   Bradycardia 01/04/2017   Coronary artery disease 01/04/2017   Stenosis of carotid artery 01/04/2017   Obesity 09/18/2016   Wenckebach block    DM (diabetes mellitus) type II uncontrolled, periph vascular disorder 05/21/2013   Spinal stenosis of lumbar region 04/08/2013   Anemia, iron deficiency 11/29/2011   B12 deficiency anemia 07/30/2011   OSA (obstructive sleep apnea) 07/06/2010   CAD, ARTERY BYPASS GRAFT 08/22/2009   Essential hypertension 03/29/2009   Bilateral lower extremity edema 02/21/2009   Hyperlipidemia LDL goal <70 11/26/2008    Immunization History  Administered Date(s) Administered   Fluad Quad(high Dose 65+) 03/21/2021   Influenza Split 06/12/2011, 05/31/2013   Influenza Whole 05/01/2012   Influenza, High Dose Seasonal PF 02/09/2015, 03/12/2017, 03/09/2018, 03/02/2020   Influenza-Unspecified 02/11/2014, 02/29/2016, 02/25/2019   Moderna Sars-Covid-2 Vaccination 08/30/2019, 09/30/2019   Pneumococcal Conjugate-13 09/18/2015   Pneumococcal Polysaccharide-23 06/20/2013   Td 03/20/2010, 03/16/2013, 09/29/2017   Zoster Recombinat (Shingrix) 11/21/2016, 09/25/2017     Conditions to be addressed/monitored: Atrial Fibrillation, CHF, CAD, HTN, HLD, Hypertriglyceridemia, DMII, CKD Stage 3, and gout; neuropathy; h/o GIB; OSA with CPAP;   There are no care plans that you recently modified to display for this patient.    Medication Assistance:  assessed for Farxiga PAP -patient does not meet  income requirement  Patient's preferred pharmacy is:  [facility] [address] [address] [place] [address] Phone: [phone] Fax: [phone]   Follow Up:  Patient agrees to Care Plan and Follow-up.  Plan: Telephone follow up appointment with care management team member scheduled for:  3 to 4 months  [name], PharmD Clinical Pharmacist [facility] [phone]

## 2021-06-20 ENCOUNTER — Other Ambulatory Visit: Payer: Self-pay | Admitting: Hematology & Oncology

## 2021-06-20 ENCOUNTER — Ambulatory Visit: Payer: Medicare Other | Admitting: Podiatry

## 2021-06-20 ENCOUNTER — Other Ambulatory Visit: Payer: Self-pay | Admitting: Family Medicine

## 2021-06-20 DIAGNOSIS — E11649 Type 2 diabetes mellitus with hypoglycemia without coma: Secondary | ICD-10-CM

## 2021-06-20 DIAGNOSIS — E1151 Type 2 diabetes mellitus with diabetic peripheral angiopathy without gangrene: Secondary | ICD-10-CM

## 2021-06-20 NOTE — Patient Instructions (Signed)
Mr. Somera It was a pleasure speaking with you today.  I have attached a summary of our visit today and information about your health goals.  [facility] and they will be able to fill a few of your medications on 06/30/2021 (ezetimibe and metformin) but unfortunately it will be too soon to fill some of the higher cost medications until January 2023.   Our next appointment is by telephone on September 25, 2921 at 1:45pm  Please call the care guide team at [phone] if you need to cancel or reschedule your appointment.   If you have any questions or concerns, please feel free to contact me either at the phone number below or with a MyChart message.   Keep up the good work!  [name], PharmD Clinical Pharmacist [facility] [phone] (direct line)  [phone] (main office number)  CARE PLAN ENTRY     Hypertension / Heart BP Readings from Last 3 Encounters:  05/28/21 (!) 110/52  05/21/21 140/70  05/01/21 124/60   Pharmacist Clinical Goal(s): Over the next 90 days, patient will work with PharmD and providers to maintain BP goal <130/80 and minimize symptoms of heart failure like swelling and shortness of breath Current regimen:  Amlodipine 5mg  daily  Metoprolol Tartrate 25mg  - take 0.5 tablet (= 12.5 mg) twice a day toresmide 20mg  - take 2 tablets = 40mg  daily  Potassium chloride 20 mEq - take 2 tablets = 60mEq twice a day Farxiga 10mg  daily Interventions: Discussed blood pressure goal, diet, and exercise Check weight daily  Patient self care activities - Over the next 90 days, patient will: Check blood pressure 1 to 2 times per week, document, and provide at future appointments Check weight daily and report if you gain more than 3 pounds in 24 hours or 5 pounds in 1 week.  Ensure daily salt intake < 2300 mg/day  Hyperlipidemia/CAD Lipid Panel     Component Value Date/Time   CHOL 162 11/23/2020 0828   TRIG 206 (H)  11/23/2020 0828   HDL 38 (L) 11/23/2020 0828   CHOLHDL 4.3 11/23/2020 0828   LDLCALC 89 11/23/2020 0828   LABVLDL 35 11/23/2020 0828   Pharmacist Clinical Goal(s): Over the next 180 days, patient will work with PharmD and providers to achieve LDL goal < 70 and TG <150 Current regimen:  Atorvastatin 80mg  daily Ezetimibe 10mg  daily Fenofibrate 160mg  daily Vascepa 1g - take 2 capsules twice a day  Nitroglycerin 0.4mg  as needed Interventions: Discussed diet and exercise Discussed risk associated with elevated triglycerides including pancreatitis  Patient self care activities - Over the next 90 days, patient will: Maintain cholesterol medication regimen.  Triglycerides are much improved! Continue with exercise and recent dietary changes.   Diabetes Lab Results  Component Value Date/Time   HGBA1C 6.3 (H) 11/23/2020 08:28 AM   HGBA1C 6.7 (H) 07/27/2020 08:58 AM   Pharmacist Clinical Goal(s): Over the next 90 days, patient will work with PharmD and providers to achieve A1c goal <7% Current regimen:  Glimepiride 1mg  daily  Metformin 1000mg  1.5 tablets each morning with food and 1 tablet each evening with food Farxiga 10mg  daily  Interventions: Discussed diet and exercise Patient self care activities - Over the next 90 days, patient will: Check blood sugar once daily, document, and provide at future appointments Contact provider with any episodes of hypoglycemia  Medication management Pharmacist Clinical Goal(s): Over the next 90 days, patient will work with PharmD and providers to  maintain optimal medication adherence [facility] [place] Interventions Comprehensive medication review performed. Reviewed refill history and assessed adherence Continue current medication management strategy Spoke to patients's pharmacy to try to fill all maintenance medication before the end of 2022 as patient is out of coverage gap / in catastrophic coverage and copays are low until  benefits restart in 2023. Pharmacy will be able to fill ezetimibe and metformin 06/30/2021. All his other medications are not due until January 2023.  Made appointment for follow up with PCP 07/26/2020 at 8:40am Patient self care activities - Over the next 90 days, patient will: Focus on medication adherence by filling medications appropriately  Take medications as prescribed Report any questions or concerns to PharmD and/or provider(s)  Patient Goals/Self-Care Activities Over the next 90 days, patient will:  take medications as prescribed check glucose daily, document, and provide at future appointments check blood pressure 2 to 3 times daily, document, and provide at future appointments target a minimum of 150 minutes of moderate intensity exercise weekly Engage in dietary modifications by continuing to cut back on sugar containing beverages and foods [place] to appointment for follow up with Dr Carollee Herter 07/26/2020 at 8:40am  Patient verbalizes understanding of instructions provided today and agrees to view in [place].

## 2021-06-21 ENCOUNTER — Encounter: Payer: Self-pay | Admitting: Family

## 2021-06-26 DIAGNOSIS — M7989 Other specified soft tissue disorders: Secondary | ICD-10-CM | POA: Diagnosis not present

## 2021-06-26 DIAGNOSIS — Z950 Presence of cardiac pacemaker: Secondary | ICD-10-CM | POA: Diagnosis not present

## 2021-06-27 ENCOUNTER — Inpatient Hospital Stay (HOSPITAL_BASED_OUTPATIENT_CLINIC_OR_DEPARTMENT_OTHER): Payer: Medicare Other | Admitting: Family

## 2021-06-27 ENCOUNTER — Inpatient Hospital Stay: Payer: Medicare Other

## 2021-06-27 ENCOUNTER — Inpatient Hospital Stay: Payer: Medicare Other | Attending: Hematology & Oncology

## 2021-06-27 ENCOUNTER — Encounter: Payer: Self-pay | Admitting: Family

## 2021-06-27 ENCOUNTER — Other Ambulatory Visit: Payer: Self-pay

## 2021-06-27 ENCOUNTER — Other Ambulatory Visit: Payer: Self-pay | Admitting: Cardiology

## 2021-06-27 ENCOUNTER — Telehealth: Payer: Self-pay | Admitting: *Deleted

## 2021-06-27 VITALS — BP 132/66 | HR 68 | Temp 98.7°F | Resp 18 | Ht 72.0 in | Wt 252.0 lb

## 2021-06-27 VITALS — BP 115/61 | HR 67

## 2021-06-27 DIAGNOSIS — R197 Diarrhea, unspecified: Secondary | ICD-10-CM | POA: Diagnosis not present

## 2021-06-27 DIAGNOSIS — D510 Vitamin B12 deficiency anemia due to intrinsic factor deficiency: Secondary | ICD-10-CM | POA: Insufficient documentation

## 2021-06-27 DIAGNOSIS — Z886 Allergy status to analgesic agent status: Secondary | ICD-10-CM | POA: Diagnosis not present

## 2021-06-27 DIAGNOSIS — D519 Vitamin B12 deficiency anemia, unspecified: Secondary | ICD-10-CM | POA: Diagnosis not present

## 2021-06-27 DIAGNOSIS — C8332 Diffuse large B-cell lymphoma, intrathoracic lymph nodes: Secondary | ICD-10-CM

## 2021-06-27 DIAGNOSIS — D500 Iron deficiency anemia secondary to blood loss (chronic): Secondary | ICD-10-CM

## 2021-06-27 DIAGNOSIS — D508 Other iron deficiency anemias: Secondary | ICD-10-CM

## 2021-06-27 DIAGNOSIS — M7989 Other specified soft tissue disorders: Secondary | ICD-10-CM | POA: Diagnosis not present

## 2021-06-27 DIAGNOSIS — C8330 Diffuse large B-cell lymphoma, unspecified site: Secondary | ICD-10-CM | POA: Insufficient documentation

## 2021-06-27 DIAGNOSIS — G629 Polyneuropathy, unspecified: Secondary | ICD-10-CM | POA: Diagnosis not present

## 2021-06-27 DIAGNOSIS — Z95828 Presence of other vascular implants and grafts: Secondary | ICD-10-CM

## 2021-06-27 DIAGNOSIS — Z79899 Other long term (current) drug therapy: Secondary | ICD-10-CM | POA: Diagnosis not present

## 2021-06-27 LAB — CMP (CANCER CENTER ONLY)
ALT: 21 U/L (ref 0–44)
AST: 17 U/L (ref 15–41)
Albumin: 4.6 g/dL (ref 3.5–5.0)
Alkaline Phosphatase: 89 U/L (ref 38–126)
Anion gap: 11 (ref 5–15)
BUN: 23 mg/dL (ref 8–23)
CO2: 24 mmol/L (ref 22–32)
Calcium: 9.7 mg/dL (ref 8.9–10.3)
Chloride: 103 mmol/L (ref 98–111)
Creatinine: 1.31 mg/dL — ABNORMAL HIGH (ref 0.61–1.24)
GFR, Estimated: 54 mL/min — ABNORMAL LOW (ref >60)
Glucose, Bld: 117 mg/dL — ABNORMAL HIGH (ref 70–99)
Potassium: 4.2 mmol/L (ref 3.5–5.1)
Sodium: 138 mmol/L (ref 135–145)
Total Bilirubin: 0.4 mg/dL (ref 0.3–1.2)
Total Protein: 7.1 g/dL (ref 6.5–8.1)

## 2021-06-27 LAB — CBC WITH DIFFERENTIAL (CANCER CENTER ONLY)
Abs Immature Granulocytes: 0.09 10*3/uL — ABNORMAL HIGH (ref 0.00–0.07)
Basophils Absolute: 0.1 10*3/uL (ref 0.0–0.1)
Basophils Relative: 1 %
Eosinophils Absolute: 0.2 10*3/uL (ref 0.0–0.5)
Eosinophils Relative: 3 %
HCT: 40.5 % (ref 39.0–52.0)
Hemoglobin: 12.9 g/dL — ABNORMAL LOW (ref 13.0–17.0)
Immature Granulocytes: 1 %
Lymphocytes Relative: 23 %
Lymphs Abs: 1.7 10*3/uL (ref 0.7–4.0)
MCH: 29 pg (ref 26.0–34.0)
MCHC: 31.9 g/dL (ref 30.0–36.0)
MCV: 91 fL (ref 80.0–100.0)
Monocytes Absolute: 0.5 10*3/uL (ref 0.1–1.0)
Monocytes Relative: 8 %
Neutro Abs: 4.7 10*3/uL (ref 1.7–7.7)
Neutrophils Relative %: 64 %
Platelet Count: 218 10*3/uL (ref 150–400)
RBC: 4.45 MIL/uL (ref 4.22–5.81)
RDW: 15.9 % — ABNORMAL HIGH (ref 11.5–15.5)
WBC Count: 7.2 10*3/uL (ref 4.0–10.5)
nRBC: 0 % (ref 0.0–0.2)

## 2021-06-27 LAB — RETICULOCYTES
Immature Retic Fract: 24.7 % — ABNORMAL HIGH (ref 2.3–15.9)
RBC.: 4.41 MIL/uL (ref 4.22–5.81)
Retic Count, Absolute: 63.5 10*3/uL (ref 19.0–186.0)
Retic Ct Pct: 1.4 % (ref 0.4–3.1)

## 2021-06-27 LAB — LACTATE DEHYDROGENASE: LDH: 169 U/L (ref 98–192)

## 2021-06-27 LAB — FERRITIN: Ferritin: 1579 ng/mL — ABNORMAL HIGH (ref 24–336)

## 2021-06-27 LAB — IRON AND IRON BINDING CAPACITY (CC-WL,HP ONLY)
Iron: 75 ug/dL (ref 45–182)
Saturation Ratios: 23 % (ref 17.9–39.5)
TIBC: 330 ug/dL (ref 250–450)
UIBC: 255 ug/dL (ref 117–376)

## 2021-06-27 LAB — VITAMIN B12: Vitamin B-12: 168 pg/mL — ABNORMAL LOW (ref 180–914)

## 2021-06-27 MED ORDER — CYANOCOBALAMIN 1000 MCG/ML IJ SOLN
1000.0000 ug | Freq: Once | INTRAMUSCULAR | Status: AC
  Administered 2021-06-27: 09:00:00 1000 ug via INTRAMUSCULAR
  Filled 2021-06-27: qty 1

## 2021-06-27 MED ORDER — DENOSUMAB 120 MG/1.7ML ~~LOC~~ SOLN
120.0000 mg | Freq: Once | SUBCUTANEOUS | Status: AC
  Administered 2021-06-27: 09:00:00 120 mg via SUBCUTANEOUS
  Filled 2021-06-27: qty 1.7

## 2021-06-27 MED ORDER — HEPARIN SOD (PORK) LOCK FLUSH 100 UNIT/ML IV SOLN
500.0000 [IU] | Freq: Once | INTRAVENOUS | Status: AC
  Administered 2021-06-27: 09:00:00 500 [IU] via INTRAVENOUS

## 2021-06-27 MED ORDER — SODIUM CHLORIDE 0.9% FLUSH
10.0000 mL | Freq: Once | INTRAVENOUS | Status: AC
  Administered 2021-06-27: 09:00:00 10 mL via INTRAVENOUS

## 2021-06-27 NOTE — Telephone Encounter (Signed)
Per 06/27/21 los - gave upcoming appointments - confirmed °

## 2021-06-27 NOTE — Progress Notes (Signed)
Hematology and Oncology Follow Up Visit  Richard [name] [id number] [birth year] 80 y.o. 06/27/2021   Principle Diagnosis:  Diffuse large cell non-Hodgkin's lymphoma (IPI = 3) - NOT "double hit" Pernicious anemia Iron deficiency secondary to bleeding   Past Therapy: R-CHOP - s/p cycle 8 - completed 08/2017   Current Therapy:        Vitamin B12 1 mg IM every month Xgeva 120 mg subcu q 3 months - next dose due in 05/2021 IV iron as indicated    Interim History:  Richard [name] is here today for follow-up and treatment. He is doing well and states that he has seen the vascular specialist. He will eventually have his port removed and his pacemaker moved to the right side. He states that he will also need to have a stent placed. He will let us know if when he is ready to have the port removed.  The swelling in the left arm is unchanged. Radial pulses are 2+.  No fever, chills, n/v, cough, rash, dizziness, SOB, chest pain, palpitations, abdominal pain or changes in bladder habits.  No blood loss noted. No bruising or petechiae.  He still has bouts with diarrhea but has started taking an imodium every morning and this seems to be helping. He has a hemorrhoid and states that preparation H has been helpful.  Neuropathy in his lower extremities is unchanged from baseline.  No falls or syncope to report.  He is still golfing weekly and enjoyed a wonderful Christmas with his family.  He has a good appetite and is staying well hydrated. His weight is stable at 252 lbs.   ECOG Performance Status: 1 - Symptomatic but completely ambulatory  Medications:  Allergies as of 06/27/2021       Reactions   Benazepril Swelling   angioedema; he is not a candidate for any angiotensin receptor blockers because of this significant allergic reaction. Because of a history of documented adverse serious drug reaction;Medi Alert bracelet  is recommended   Hctz [hydrochlorothiazide] Anaphylaxis, Swelling   Tongue and  lip swelling   Benazepril Hcl Other (See Comments)   Aspirin Other (See Comments)   Gastritis, cant take 325 Mg aspirin    Lactose Intolerance (gi) Nausea And Vomiting        Medication List        Accurate as of June 27, 2021  8:47 AM. If you have any questions, ask your nurse or doctor.          acetaminophen 325 MG tablet Commonly known as: TYLENOL Take 650 mg by mouth at bedtime as needed for moderate pain or headache.   allopurinol 300 MG tablet Commonly known as: ZYLOPRIM Take 450 mg by mouth daily.   aluminum hydroxide-magnesium carbonate 95-358 MG/15ML Susp Commonly known as: GAVISCON Take 15 mLs by mouth as needed for indigestion or heartburn.   amLODipine 5 MG tablet Commonly known as: NORVASC TAKE 1 TABLET BY MOUTH TWICE A DAY   apixaban 5 MG Tabs tablet Commonly known as: ELIQUIS Take 1 tablet (5 mg total) by mouth 2 (two) times daily.   atorvastatin 80 MG tablet Commonly known as: LIPITOR TAKE ONE TABLET BY MOUTH AT BEDTIME   azelastine 0.1 % nasal spray Commonly known as: ASTELIN Place 2 sprays into both nostrils at bedtime as needed for rhinitis or allergies. What changed: when to take this   dapagliflozin propanediol 10 MG Tabs tablet Commonly known as: FARXIGA Take 10 mg by mouth daily.   esomeprazole  40 MG capsule Commonly known as: NexIUM Take 1 capsule (40 mg total) by mouth daily.   eye wash Soln Place 1 drop into both eyes at bedtime. [facility] Soothing Eye Wash   ezetimibe 10 MG tablet Commonly known as: ZETIA TAKE 1 TABLET BY MOUTH EVERY DAY   fenofibrate 160 MG tablet TAKE 1 TABLET BY MOUTH EVERY DAY   folic acid 1 MG tablet Commonly known as: FOLVITE TAKE 2 TABLETS BY MOUTH EVERY DAY   freestyle lancets USE TWICE A DAY TO CHECK BLOOD SUGAR. DX E11.9   FREESTYLE LITE test strip Generic drug: glucose blood USE TO TEST BLOOD SUGAR ONCE A DAY. DX CODE: E11.9   glimepiride 1 MG tablet Commonly known as:  AMARYL TAKE 1 TABLET BY MOUTH EVERY DAY WITH BREAKFAST   icosapent Ethyl 1 g capsule Commonly known as: VASCEPA TAKE 2 CAPSULES BY MOUTH TWICE A DAY   ICY HOT ADVANCED PAIN RELIEF EX Apply 1 application topically daily. Roll-on   Klor-Con M20 20 MEQ tablet Generic drug: potassium chloride SA TAKE 2 TABLETS (40 MEQ TOTAL) BY MOUTH 2 (TWO) TIMES DAILY.   metFORMIN 1000 MG tablet Commonly known as: GLUCOPHAGE TAKE 1 & 1/2 TABLETS BY MOUTH EVERY MORNING AND 1 TABLET IN THE EVENING.   metoprolol tartrate 25 MG tablet Commonly known as: LOPRESSOR TAKE ONE-HALF TABLET BY MOUTH TWICE DAILY   nitroGLYCERIN 0.4 MG SL tablet Commonly known as: NITROSTAT PLACE 1 TABLET (0.4 MG TOTAL) UNDER THE TONGUE EVERY 5 (FIVE) MINUTES AS NEEDED FOR CHEST PAIN.   pregabalin 150 MG capsule Commonly known as: LYRICA TAKE 1 CAPSULE BY MOUTH TWICE A DAY   psyllium 58.6 % powder Commonly known as: METAMUCIL Take 1 packet by mouth daily as needed (constipation).   torsemide 20 MG tablet Commonly known as: DEMADEX Take 20 mg by mouth daily.        Allergies:  Allergies  Allergen Reactions   Benazepril Swelling    angioedema; he is not a candidate for any angiotensin receptor blockers because of this significant allergic reaction. Because of a history of documented adverse serious drug reaction;Medi Alert bracelet  is recommended   Hctz [Hydrochlorothiazide] Anaphylaxis and Swelling    Tongue and lip swelling    Benazepril Hcl Other (See Comments)   Aspirin Other (See Comments)    Gastritis, cant take 325 Mg aspirin    Lactose Intolerance (Gi) Nausea And Vomiting    Past Medical History, Surgical history, Social history, and Family History were reviewed and updated.  Review of Systems: All other 10 point review of systems is negative.   Physical Exam:  vitals were not taken for this visit.   Wt Readings from Last 3 Encounters:  05/28/21 252 lb (114.3 kg)  05/21/21 253 lb 3.2 oz  (114.9 kg)  04/25/21 245 lb (111.1 kg)    Ocular: Sclerae unicteric, pupils equal, round and reactive to light Ear-nose-throat: Oropharynx clear, dentition fair Lymphatic: No cervical or supraclavicular adenopathy, lymphedema of the left arm unchanged Lungs no rales or rhonchi, good excursion bilaterally Heart regular rate and rhythm, no murmur appreciated Abd soft, nontender, positive bowel sounds MSK no focal spinal tenderness, no joint edema Neuro: non-focal, well-oriented, appropriate affect Breasts: Deferred   Lab Results  Component Value Date   WBC 7.2 06/27/2021   HGB 12.9 (L) 06/27/2021   HCT 40.5 06/27/2021   MCV 91.0 06/27/2021   PLT 218 06/27/2021   Lab Results  Component Value Date   FERRITIN 1,567 (  H) 05/28/2021   IRON 71 05/28/2021   TIBC 329 05/28/2021   UIBC 258 05/28/2021   IRONPCTSAT 22 05/28/2021   Lab Results  Component Value Date   RETICCTPCT 1.4 06/27/2021   RBC 4.45 06/27/2021   RETICCTABS 52.1 11/22/2011   No results found for: Nils Pyle [facility] Lab Results  Component Value Date   IGA 159 03/09/2012   Lab Results  Component Value Date   ALBUMINELP 4.2 07/27/2018   MSPIKE Not Observed 07/27/2018     Chemistry      Component Value Date/Time   NA 141 05/28/2021 0817   NA 136 03/16/2019 0938   NA 144 06/27/2017 0857   K 3.4 (L) 05/28/2021 0817   K 3.9 06/27/2017 0857   CL 103 05/28/2021 0817   CL 103 06/27/2017 0857   CO2 26 05/28/2021 0817   CO2 26 06/27/2017 0857   BUN 29 (H) 05/28/2021 0817   BUN 34 (H) 03/16/2019 0938   BUN 12 06/27/2017 0857   CREATININE 1.51 (H) 05/28/2021 0817   CREATININE 1.65 (H) 04/18/2020 0956      Component Value Date/Time   CALCIUM 10.1 05/28/2021 0817   CALCIUM 9.2 06/27/2017 0857   ALKPHOS 70 05/28/2021 0817   ALKPHOS 128 (H) 06/27/2017 0857   AST 16 05/28/2021 0817   ALT 15 05/28/2021 0817   ALT 24 06/27/2017 0857   BILITOT 0.6 05/28/2021 0817       Impression and  Plan: Mr. Joubert is a very pleasant 80 yo caucasian gentleman with diffuse large B-cell lymphoma (not "double hit" lymphoma). He completed treatment in February 2019.  B 12 and Xgeva given today.  Iron studies are pending.  Follow-up in 1 month.   Lottie Dawson, NP 12/28/20228:47 AM

## 2021-06-27 NOTE — Addendum Note (Signed)
Addended by: Lucile Crater on: 06/27/2021 08:35 AM   Modules accepted: Orders

## 2021-06-30 DIAGNOSIS — E785 Hyperlipidemia, unspecified: Secondary | ICD-10-CM | POA: Diagnosis not present

## 2021-06-30 DIAGNOSIS — E1122 Type 2 diabetes mellitus with diabetic chronic kidney disease: Secondary | ICD-10-CM

## 2021-07-10 ENCOUNTER — Ambulatory Visit: Payer: Medicare Other

## 2021-07-10 DIAGNOSIS — M7989 Other specified soft tissue disorders: Secondary | ICD-10-CM | POA: Diagnosis not present

## 2021-07-10 DIAGNOSIS — Z9889 Other specified postprocedural states: Secondary | ICD-10-CM | POA: Diagnosis not present

## 2021-07-10 DIAGNOSIS — Z950 Presence of cardiac pacemaker: Secondary | ICD-10-CM | POA: Diagnosis not present

## 2021-07-17 ENCOUNTER — Ambulatory Visit (INDEPENDENT_AMBULATORY_CARE_PROVIDER_SITE_OTHER): Payer: Medicare Other | Admitting: Podiatry

## 2021-07-17 ENCOUNTER — Encounter: Payer: Self-pay | Admitting: Podiatry

## 2021-07-17 ENCOUNTER — Other Ambulatory Visit: Payer: Self-pay

## 2021-07-17 DIAGNOSIS — M79675 Pain in left toe(s): Secondary | ICD-10-CM | POA: Diagnosis not present

## 2021-07-17 DIAGNOSIS — E1142 Type 2 diabetes mellitus with diabetic polyneuropathy: Secondary | ICD-10-CM

## 2021-07-17 DIAGNOSIS — E1165 Type 2 diabetes mellitus with hyperglycemia: Secondary | ICD-10-CM | POA: Diagnosis not present

## 2021-07-17 DIAGNOSIS — B351 Tinea unguium: Secondary | ICD-10-CM | POA: Diagnosis not present

## 2021-07-17 DIAGNOSIS — M79674 Pain in right toe(s): Secondary | ICD-10-CM | POA: Diagnosis not present

## 2021-07-17 NOTE — Progress Notes (Signed)
This patient returns to my office for at risk foot care.  This patient requires this care by a professional since this patient will be at risk due to having diabetic neuropathy  and coagulation defect.  Patient is taking eliquis. This patient is unable to cut nails himself since the patient cannot reach his nails.These nails are painful walking and wearing shoes.  Patient says the callus on his right forefoot has become painful when walking.  This patient presents for at risk foot care today.  General Appearance  Alert, conversant and in no acute stress.  Vascular  Dorsalis pedis and posterior tibial  pulses are palpable  bilaterally.  Capillary return is within normal limits  bilaterally. Temperature is within normal limits  bilaterally.  Neurologic  Senn-Weinstein monofilament wire test within normal limits  Left foot.  LOPS right foot is absent.  . Muscle power within normal limits bilaterally.  Nails Thick disfigured discolored nails with subungual debris  from hallux to fifth toes bilaterally. No evidence of bacterial infection or drainage bilaterally.  Orthopedic  No limitations of motion  feet .  No crepitus or effusions noted.  No bony pathology or digital deformities noted.  Plantar flexed fifth metatarsal right foot.  Skin  normotropic skin with noted bilaterally.  No signs of infections or ulcers noted  Onychomycosis  Pain in right toes  Pain in left toes  Porokeratosis sub 5th right  Consent was obtained for treatment procedures.   Mechanical debridement of nails 1-5  bilaterally performed with a nail nipper.  Filed with dremel without incident.    Return office visit   12 weeks.                  Told patient to return for periodic foot care and evaluation due to potential at risk complications.   Gardiner Barefoot DPM

## 2021-07-24 ENCOUNTER — Ambulatory Visit: Payer: Medicare Other

## 2021-07-26 ENCOUNTER — Ambulatory Visit (INDEPENDENT_AMBULATORY_CARE_PROVIDER_SITE_OTHER): Payer: Medicare Other | Admitting: Family Medicine

## 2021-07-26 ENCOUNTER — Other Ambulatory Visit: Payer: Self-pay | Admitting: Family Medicine

## 2021-07-26 ENCOUNTER — Encounter: Payer: Self-pay | Admitting: Family Medicine

## 2021-07-26 VITALS — BP 132/76 | HR 68 | Temp 97.5°F | Resp 20 | Ht 72.0 in | Wt 253.0 lb

## 2021-07-26 DIAGNOSIS — R351 Nocturia: Secondary | ICD-10-CM | POA: Diagnosis not present

## 2021-07-26 DIAGNOSIS — I10 Essential (primary) hypertension: Secondary | ICD-10-CM | POA: Diagnosis not present

## 2021-07-26 DIAGNOSIS — I5033 Acute on chronic diastolic (congestive) heart failure: Secondary | ICD-10-CM | POA: Diagnosis not present

## 2021-07-26 DIAGNOSIS — D692 Other nonthrombocytopenic purpura: Secondary | ICD-10-CM

## 2021-07-26 DIAGNOSIS — I25810 Atherosclerosis of coronary artery bypass graft(s) without angina pectoris: Secondary | ICD-10-CM | POA: Diagnosis not present

## 2021-07-26 DIAGNOSIS — C8332 Diffuse large B-cell lymphoma, intrathoracic lymph nodes: Secondary | ICD-10-CM | POA: Diagnosis not present

## 2021-07-26 DIAGNOSIS — E785 Hyperlipidemia, unspecified: Secondary | ICD-10-CM

## 2021-07-26 DIAGNOSIS — K219 Gastro-esophageal reflux disease without esophagitis: Secondary | ICD-10-CM

## 2021-07-26 DIAGNOSIS — E1165 Type 2 diabetes mellitus with hyperglycemia: Secondary | ICD-10-CM

## 2021-07-26 DIAGNOSIS — M7989 Other specified soft tissue disorders: Secondary | ICD-10-CM

## 2021-07-26 DIAGNOSIS — N183 Chronic kidney disease, stage 3 unspecified: Secondary | ICD-10-CM | POA: Diagnosis not present

## 2021-07-26 DIAGNOSIS — E1142 Type 2 diabetes mellitus with diabetic polyneuropathy: Secondary | ICD-10-CM

## 2021-07-26 DIAGNOSIS — I4892 Unspecified atrial flutter: Secondary | ICD-10-CM

## 2021-07-26 DIAGNOSIS — E118 Type 2 diabetes mellitus with unspecified complications: Secondary | ICD-10-CM

## 2021-07-26 DIAGNOSIS — I129 Hypertensive chronic kidney disease with stage 1 through stage 4 chronic kidney disease, or unspecified chronic kidney disease: Secondary | ICD-10-CM | POA: Diagnosis not present

## 2021-07-26 DIAGNOSIS — I25118 Atherosclerotic heart disease of native coronary artery with other forms of angina pectoris: Secondary | ICD-10-CM

## 2021-07-26 DIAGNOSIS — E1169 Type 2 diabetes mellitus with other specified complication: Secondary | ICD-10-CM

## 2021-07-26 DIAGNOSIS — I350 Nonrheumatic aortic (valve) stenosis: Secondary | ICD-10-CM

## 2021-07-26 DIAGNOSIS — E1122 Type 2 diabetes mellitus with diabetic chronic kidney disease: Secondary | ICD-10-CM | POA: Diagnosis not present

## 2021-07-26 DIAGNOSIS — D61818 Other pancytopenia: Secondary | ICD-10-CM

## 2021-07-26 LAB — COMPREHENSIVE METABOLIC PANEL
ALT: 12 U/L (ref 0–53)
AST: 14 U/L (ref 0–37)
Albumin: 4.8 g/dL (ref 3.5–5.2)
Alkaline Phosphatase: 65 U/L (ref 39–117)
BUN: 28 mg/dL — ABNORMAL HIGH (ref 6–23)
CO2: 28 mEq/L (ref 19–32)
Calcium: 10.5 mg/dL (ref 8.4–10.5)
Chloride: 103 mEq/L (ref 96–112)
Creatinine, Ser: 1.62 mg/dL — ABNORMAL HIGH (ref 0.40–1.50)
GFR: 39.15 mL/min — ABNORMAL LOW (ref >60.00)
Glucose, Bld: 112 mg/dL — ABNORMAL HIGH (ref 70–99)
Potassium: 4.9 mEq/L (ref 3.5–5.1)
Sodium: 141 mEq/L (ref 135–145)
Total Bilirubin: 0.5 mg/dL (ref 0.2–1.2)
Total Protein: 7.3 g/dL (ref 6.0–8.3)

## 2021-07-26 LAB — MICROALBUMIN / CREATININE URINE RATIO
Creatinine,U: 25 mg/dL
Microalb Creat Ratio: 2.8 mg/g (ref 0.0–30.0)
Microalb, Ur: <0.7 mg/dL (ref 0.0–1.9)

## 2021-07-26 LAB — LIPID PANEL
Cholesterol: 168 mg/dL (ref 0–200)
HDL: 32.4 mg/dL — ABNORMAL LOW (ref >39.00)
Total CHOL/HDL Ratio: 5
Triglycerides: 690 mg/dL — ABNORMAL HIGH (ref 0.0–149.0)

## 2021-07-26 LAB — PSA: PSA: 1.03 ng/mL (ref 0.10–4.00)

## 2021-07-26 LAB — LDL CHOLESTEROL, DIRECT: Direct LDL: 58 mg/dL

## 2021-07-26 LAB — HEMOGLOBIN A1C: Hgb A1c MFr Bld: 6.3 % (ref 4.6–6.5)

## 2021-07-26 MED ORDER — ESOMEPRAZOLE MAGNESIUM 40 MG PO CPDR: 40.0000 mg | DELAYED_RELEASE_CAPSULE | Freq: Every day | ORAL | 3 refills | Status: DC

## 2021-07-26 NOTE — Assessment & Plan Note (Signed)
S/p TAVR Per cardiology

## 2021-07-26 NOTE — Assessment & Plan Note (Signed)
Well controlled, no changes to meds. Encouraged heart healthy diet such as the DASH diet and exercise as tolerated.  °

## 2021-07-26 NOTE — Assessment & Plan Note (Signed)
Stable

## 2021-07-26 NOTE — Assessment & Plan Note (Signed)
With neuropathy Stable  He is playing golf 3 days a week

## 2021-07-26 NOTE — Progress Notes (Signed)
Established Patient Office Visit  Subjective:  Patient ID: Richard [name], male    DOB: 05-08-1941  Age: 81 y.o. MRN: [id number]  CC:  Chief Complaint  Patient presents with   Diabetes   Hypertension   Hyperlipidemia   Follow-up    HPI ANTIONIO NEGRON presents for f/u dm, htn and lipid    He has a blockage in L arm --- from pacemaker wire---- he will go to [facility] to have port removed and pacemaker moved.    Pt has no other complaints.  He plays golf 3 days a week weather permitting   HYPERTENSION  Blood pressure range-not checking   Chest pain- no      Dyspnea- no Lightheadedness- no   Edema- swelling L arm Other side effects - no   Medication compliance: good Low salt diet- yes  DIABETES  Blood Sugar ranges-108-148  Polyuria- no New Visual problems- no Hypoglycemic symptoms- no Other side effects-no Medication compliance - good Last eye exam- dec 2022 Foot exam- podiatry  HYPERLIPIDEMIA  Medication compliance- good  RUQ pain- no  Muscle aches- no Other side effects-no     Past Medical History:  Diagnosis Date   Anemia    Arthritis    hips   Axillary adenopathy 02/25/2017   Bradycardia    a. holter monitor has demonstrated HRs in 30s and [place]ink[place]ach    CAD (coronary artery disease)    a. s/p CABG 2001  b.  07/28/2017 cath:   Severe three-vessel native CAD with total occlusion of LAD, ramus intermedius, first OM and RCA, patent RIMA to PDA, LIMA to LAD, sequential SVG to ramus intermedius and first OM.     Chronic lower back pain    Diffuse large B cell lymphoma (HCC)    Diverticulosis    Esophageal stricture    GERD (gastroesophageal reflux disease)    History of gout    HTN (hypertension)    Mixed hyperlipidemia    OSA on CPAP    with 2L O2 at night   Pancytopenia ([place])    a. related to chemo therapy for B cell lymphoma   Peptic stricture of esophagus    Presence of permanent cardiac pacemaker    sees Dr. Valaria Good pacemaker   SCC (squamous  cell carcinoma) 01/31/2021   R zygoma, ED[facility]   SCC (squamous cell carcinoma) 01/31/2021   L post ankle, ED[facility]   SCC (squamous cell carcinoma) 01/31/2021   L popliteal, EDC   Severe aortic stenosis    Spinal stenosis    Squamous cell carcinoma of skin 03/22/2009   Right mandible. SCCis, hypertrophic.    Type II diabetes mellitus ([place])    Wears dentures    partial upper    Past Surgical History:  Procedure Laterality Date   APPENDECTOMY  ~ [place] Left 11/06/2016   Procedure: CATARACT EXTRACTION PHACO AND INTRAOCULAR LENS PLACEMENT (IOC);  Surgeon: Estill Cotta, MD;  Location: [facility];  Service: Ophthalmology;  Laterality: Left;  Lot # [id number] H [place]: 01:09.4 AP%:25.2 CDE: 30.64   CATARACT EXTRACTION W/PHACO Right 12/04/2016   Procedure: CATARACT EXTRACTION PHACO AND INTRAOCULAR LENS PLACEMENT (IOC);  Surgeon: Estill Cotta, MD;  Location: [facility];  Service: Ophthalmology;  Laterality: Right;  [place] 1:25.9 AP% 24.1 CDE 39.10 Fluid Pack lot # [id number]   COLONOSCOPY W/ BIOPSIES AND POLYPECTOMY  2013   [place]   CORONARY ANGIOPLASTY WITH STENT PLACEMENT  05/1997   "1"   CORONARY ARTERY BYPASS GRAFT  03/2000   "CABG X5"   ECTROPION REPAIR Right 09/01/2018   Procedure: REPAIR OF ECTROPION BILATERAL upper and lower;  Surgeon: Karle Starch, MD;  Location: [facility];  Service: Ophthalmology;  Laterality: Right;  Diabetic - oral meds sleep apnea   ESOPHAGEAL DILATION  X 3-4   Dr. Lyla Son; "last one was in the 1990's"   ESOPHAGOGASTRODUODENOSCOPY     multiple   FLEXIBLE SIGMOIDOSCOPY     multiple   HYDRADENITIS EXCISION Left 02/25/2017   Procedure: EXCISION DEEP LEFT AXILLARY LYMPH NODE;  Surgeon: Fanny Skates, MD;  Location: [facility];  Service: General;  Laterality: Left;   INTRAOPERATIVE TRANSTHORACIC ECHOCARDIOGRAM N/A 09/09/2017   Procedure: INTRAOPERATIVE TRANSTHORACIC ECHOCARDIOGRAM;  Surgeon: Burnell Blanks, MD;  Location: [facility];  Service: Open Heart Surgery;  Laterality: N/A;   KNEE ARTHROSCOPY Left 2011   meniscus repair   LEFT HEART CATHETERIZATION WITH CORONARY/GRAFT ANGIOGRAM N/A 03/16/2014   Procedure: LEFT HEART CATHETERIZATION WITH Beatrix Fetters;  Surgeon: Burnell Blanks, MD;  Location: [facility];  Service: Cardiovascular;  Laterality: N/A;   LUMBAR LAMINECTOMY/DECOMPRESSION MICRODISCECTOMY Right 06/17/2013   Procedure: LUMBAR LAMINECTOMY MICRODISCECTOMY L4-L5 RIGHT EXCISION OF SYNOVIAL CYST RIGHT   (1 LEVEL) RIGHT PARTIAL FACETECTOMY;  Surgeon: Tobi Bastos, MD;  Location: [facility];  Service: Orthopedics;  Laterality: Right;   MYELOGRAM  04/06/13   lumbar, Dr Gladstone Lighter   ORBITAL LESION EXCISION Right 09/01/2018   Procedure: ORBITOTOMY WITHOUT BONE FLAP WITH REMOVAL OF LESION RIGHT;  Surgeon: Karle Starch, MD;  Location: [facility];  Service: Ophthalmology;  Laterality: Right;   PACEMAKER IMPLANT N/A 09/10/2017   Procedure: PACEMAKER IMPLANT;  Surgeon: Deboraha Sprang, MD;  Location: [place] [facility];  Service: Cardiovascular;  Laterality: N/A;   PANENDOSCOPY     PERIPHERAL VASCULAR BALLOON ANGIOPLASTY Left 03/16/2021   Procedure: PERIPHERAL VASCULAR BALLOON ANGIOPLASTY;  Surgeon: [name], MD;  Location: [place] [facility];  Service: Cardiovascular;  Laterality: Left;  subclavian  vein   PORTACATH PLACEMENT N/A 03/06/2017   Procedure: INSERTION PORT-A-CATH AND ASPIRATE SEROMA LEFT AXILLA;  Surgeon: Fanny Skates, MD;  Location: [facility];  Service: General;  Laterality: N/A;   RIGHT/LEFT HEART CATH AND CORONARY/GRAFT ANGIOGRAPHY N/A 07/28/2017   Procedure: RIGHT/LEFT HEART CATH AND CORONARY/GRAFT ANGIOGRAPHY;  Surgeon: Sherren Mocha, MD;  Location: [place];  Service: Cardiovascular;  Laterality: N/A;   SHOULDER SURGERY Right 08/2010   screws placed; "tendons tore off"   SKIN CANCER EXCISION Right    "neck"   TONSILLECTOMY  ~ [place] Left 06/29/2019   Procedure: TOTAL KNEE ARTHROPLASTY;  Surgeon: Paralee Cancel, MD;  Location: [facility];  Service: Orthopedics;  Laterality: Left;  70 mins   TRANSCATHETER AORTIC VALVE REPLACEMENT, TRANSFEMORAL N/A 09/09/2017   Procedure: TRANSCATHETER AORTIC VALVE REPLACEMENT, TRANSFEMORAL;  Surgeon: Burnell Blanks, MD;  Location: [facility];  Service: Open Heart Surgery;  Laterality: N/A;   UPPER EXTREMITY VENOGRAPHY Left 02/15/2021   Procedure: CENTRAL VENO;  Surgeon: [name], MD;  Location: [facility];  Service: Cardiovascular;  Laterality: Left;   UPPER EXTREMITY VENOGRAPHY N/A 03/16/2021   Procedure: UPPER EXTREMITY VENOGRAPHY;  Surgeon: [name], MD;  Location: [facility];  Service: Cardiovascular;  Laterality: N/A;   UPPER GI ENDOSCOPY  2013   Gastritis; Dr Carlean Purl   VASECTOMY  Family History  Problem Relation Age of Onset   Stroke Father    Hypertension Father    Pancreatic cancer Mother    Diabetes Maternal Grandmother    Stroke Maternal Grandmother    Heart attack Paternal Grandmother    Colon cancer Neg Hx    Esophageal cancer Neg Hx    Rectal cancer Neg Hx    Stomach cancer Neg Hx    Ulcers Neg Hx     Social History   Socioeconomic History   Marital status: Divorced    Spouse name: Not on file   Number of children: 2   Years of education: college   Highest education level: Not on file  Occupational History    Employer: RETIRED  Tobacco Use   Smoking status: Never   Smokeless tobacco: Never  Vaping Use   Vaping Use: Never used  Substance and Sexual Activity   Alcohol use: No    Alcohol/week: 0.0 standard drinks    Comment: "last drink was in 2012"( 03/15/2014)   Drug use: No   Sexual activity: Not Currently  Other Topics Concern   Not on file  Social History Narrative   ** Merged History Encounter **       Divorced, lives with a roommate. 1 son one daughter 3-4 caffeinated beverages  daily Right-handed. 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   Social Determinants of Health   Financial Resource Strain: Low Risk    Difficulty of Paying Living Expenses: Not very hard  Food Insecurity: Not on file  Transportation Needs: Not on file  Physical Activity: Sufficiently Active   Days of Exercise per Week: 5 days   Minutes of Exercise per Session: 30 min  Stress: Not on file  Social Connections: Not on file  Intimate Partner Violence: Not on file    Outpatient Medications Prior to Visit  Medication Sig Dispense Refill   acetaminophen (TYLENOL) 325 MG tablet Take 650 mg by mouth at bedtime as needed for moderate pain or headache.     allopurinol (ZYLOPRIM) 300 MG tablet Take 450 mg by mouth daily.     aluminum hydroxide-magnesium carbonate (GAVISCON) 95-358 MG/15ML SUSP Take 15 mLs by mouth as needed for indigestion or heartburn.     amLODipine (NORVASC) 5 MG tablet TAKE 1 TABLET BY MOUTH TWICE A DAY 180 tablet 3   apixaban (ELIQUIS) 5 MG TABS tablet Take 1 tablet (5 mg total) by mouth 2 (two) times daily. 180 tablet 1   atorvastatin (LIPITOR) 80 MG tablet TAKE ONE TABLET BY MOUTH AT BEDTIME 90 tablet 3   azelastine (ASTELIN) 0.1 % nasal spray Place 2 sprays into both nostrils at bedtime as needed for rhinitis or allergies. (Patient taking differently: Place 2 sprays into both nostrils 2 (two) times daily as needed for rhinitis or allergies.) 30 mL 12   dapagliflozin propanediol (FARXIGA) 10 MG TABS tablet Take 10 mg by mouth daily.     esomeprazole (NEXIUM) 40 MG capsule Take 1 capsule (40 mg total) by mouth daily. 30 capsule 5   eye wash (,SODIUM/POTASSIUM/SOD CHLORIDE,) SOLN Place 1 drop into both eyes at bedtime. [facility] Soothing Eye Wash     ezetimibe (ZETIA) 10 MG tablet TAKE 1 TABLET BY MOUTH EVERY DAY 90 tablet 3   fenofibrate 160 MG tablet  TAKE 1 TABLET BY MOUTH EVERY DAY 90 tablet 0  folic acid (FOLVITE) 1 MG tablet TAKE 2 TABLETS BY MOUTH EVERY DAY 180 tablet 4   FREESTYLE LITE test strip USE TO TEST BLOOD SUGAR ONCE A DAY. DX CODE: E11.9 100 strip 12   glimepiride (AMARYL) 1 MG tablet TAKE 1 TABLET BY MOUTH EVERY DAY WITH BREAKFAST 90 tablet 1   icosapent Ethyl (VASCEPA) 1 g capsule TAKE 2 CAPSULES BY MOUTH TWICE A DAY 120 capsule 4   KLOR-CON M20 20 MEQ tablet TAKE 2 TABLETS (40 MEQ TOTAL) BY MOUTH 2 (TWO) TIMES DAILY. 360 tablet 3   Lancets (FREESTYLE) lancets USE TWICE A DAY TO CHECK BLOOD SUGAR. DX E11.9 100 each 6   Menthol-Camphor (ICY HOT ADVANCED PAIN RELIEF EX) Apply 1 application topically daily. Roll-on     metFORMIN (GLUCOPHAGE) 1000 MG tablet TAKE 1 & 1/2 TABLETS BY MOUTH EVERY MORNING AND 1 TABLET IN THE EVENING. 225 tablet 1   metoprolol tartrate (LOPRESSOR) 25 MG tablet TAKE ONE-HALF TABLET BY MOUTH TWICE DAILY 90 tablet 3   nitroGLYCERIN (NITROSTAT) 0.4 MG SL tablet PLACE 1 TABLET (0.4 MG TOTAL) UNDER THE TONGUE EVERY 5 (FIVE) MINUTES AS NEEDED FOR CHEST PAIN. 25 tablet 6   pregabalin (LYRICA) 150 MG capsule TAKE 1 CAPSULE BY MOUTH TWICE A DAY 180 capsule 0   psyllium (METAMUCIL) 58.6 % powder Take 1 packet by mouth daily as needed (constipation).     torsemide (DEMADEX) 20 MG tablet Take 20 mg by mouth daily.     Facility-Administered Medications Prior to Visit  Medication Dose Route Frequency Provider Last Rate Last Admin   sodium chloride flush (NS) 0.9 % injection 10 mL  10 mL Intravenous PRN [name], MD   10 mL at 05/30/17 0920    Allergies  Allergen Reactions   Benazepril Swelling    angioedema; he is not a candidate for any angiotensin receptor blockers because of this significant allergic reaction. Because of a history of documented adverse serious drug reaction;Medi Alert bracelet  is recommended   Hctz [Hydrochlorothiazide] Anaphylaxis and Swelling    Tongue and lip swelling     Benazepril Hcl Other (See Comments)   Aspirin Other (See Comments)    Gastritis, cant take 325 Mg aspirin    Lactose Intolerance (Gi) Nausea And Vomiting    ROS Review of Systems  Constitutional: Negative.  Negative for fever.  HENT:  Negative for congestion, ear pain, hearing loss, nosebleeds, postnasal drip, rhinorrhea, sinus pressure, sneezing and tinnitus.   Eyes:  Negative for photophobia, discharge, itching and visual disturbance.  Respiratory: Negative.  Negative for shortness of breath.   Cardiovascular: Negative.  Negative for chest pain, palpitations and leg swelling.  Gastrointestinal:  Negative for abdominal distention, abdominal pain, anal bleeding, blood in stool, constipation and nausea.  Endocrine: Negative.   Genitourinary: Negative.  Negative for dysuria and frequency.  Musculoskeletal: Negative.   Skin: Negative.  Negative for rash.  Allergic/Immunologic: Negative.  Negative for environmental allergies.  Neurological:  Negative for dizziness, weakness, light-headedness, numbness and headaches.  Psychiatric/Behavioral:  Negative for agitation, confusion, decreased concentration, dysphoric mood, sleep disturbance and suicidal ideas. The patient is not nervous/anxious.      Objective:    Physical Exam Vitals and nursing note reviewed.  Constitutional:      Appearance: He is well-developed.  HENT:     Head: Normocephalic and atraumatic.  Eyes:     Pupils: Pupils are equal, round, and reactive to light.  Neck:     Thyroid: No thyromegaly.  Cardiovascular:     Rate and Rhythm: Normal rate and regular rhythm.     Heart sounds: No murmur heard. Pulmonary:     Effort: Pulmonary effort is normal. No respiratory distress.     Breath sounds: Normal breath sounds. No wheezing or rales.  Chest:     Chest wall: No tenderness.  Musculoskeletal:        General: Swelling present. No tenderness.     Left forearm: Swelling and edema present.     Cervical back: Normal  range of motion and neck supple.  Skin:    General: Skin is warm and dry.  Neurological:     Mental Status: He is alert and oriented to person, place, and time.  Psychiatric:        Behavior: Behavior normal.        Thought Content: Thought content normal.        Judgment: Judgment normal.    BP 132/76 (BP Location: Left Arm, Patient Position: Sitting, Cuff Size: Normal)    Pulse 68    Temp (!) 97.5 F (36.4 C) (Oral)    Resp 20    Ht 6' (1.829 m)    Wt 253 lb (114.8 kg)    SpO2 96%    BMI 34.31 kg/m  Wt Readings from Last 3 Encounters:  07/26/21 253 lb (114.8 kg)  06/27/21 252 lb 0.6 oz (114.3 kg)  05/28/21 252 lb (114.3 kg)     Health Maintenance Due  Topic Date Due   COVID-19 Vaccine (3 - Moderna risk series) 10/28/2019   HEMOGLOBIN A1C  05/26/2021   URINE MICROALBUMIN  07/27/2021    There are no preventive care reminders to display for this patient.  Lab Results  Component Value Date   TSH 2.450 07/27/2018   Lab Results  Component Value Date   WBC 7.2 06/27/2021   HGB 12.9 (L) 06/27/2021   HCT 40.5 06/27/2021   MCV 91.0 06/27/2021   PLT 218 06/27/2021   Lab Results  Component Value Date   NA 138 06/27/2021   K 4.2 06/27/2021   CO2 24 06/27/2021   GLUCOSE 117 (H) 06/27/2021   BUN 23 06/27/2021   CREATININE 1.31 (H) 06/27/2021   BILITOT 0.4 06/27/2021   ALKPHOS 89 06/27/2021   AST 17 06/27/2021   ALT 21 06/27/2021   PROT 7.1 06/27/2021   ALBUMIN 4.6 06/27/2021   CALCIUM 9.7 06/27/2021   ANIONGAP 11 06/27/2021   GFR 32.35 (L) 07/27/2020   Lab Results  Component Value Date   CHOL 162 11/23/2020   Lab Results  Component Value Date   HDL 38 (L) 11/23/2020   Lab Results  Component Value Date   LDLCALC 89 11/23/2020   Lab Results  Component Value Date   TRIG 206 (H) 11/23/2020   Lab Results  Component Value Date   CHOLHDL 4.3 11/23/2020   Lab Results  Component Value Date   HGBA1C 6.3 (H) 11/23/2020      Assessment & Plan:   Problem  List Items Addressed This Visit       Unprioritized   Pancytopenia ([place])   Coronary artery disease   Relevant Orders   Lipid panel   Comprehensive metabolic panel   Hemoglobin A1c   Microalbumin / creatinine urine ratio   PSA   Acute on chronic diastolic CHF (congestive heart failure), NYHA class 2 (HCC)   Relevant Orders   Lipid panel   Comprehensive metabolic panel   Hemoglobin A1c  Microalbumin / creatinine urine ratio   PSA   CAD, ARTERY BYPASS GRAFT   Relevant Orders   Lipid panel   Comprehensive metabolic panel   Hemoglobin A1c   Microalbumin / creatinine urine ratio   PSA   Senile purpura (HCC)   Uncontrolled type 2 diabetes mellitus with hyperglycemia (HCC)   Relevant Orders   Lipid panel   Comprehensive metabolic panel   Hemoglobin A1c   Microalbumin / creatinine urine ratio   PSA   Aortic stenosis, severe    S/p TAVR Per cardiology      Diabetic peripheral neuropathy associated with type 2 diabetes mellitus (HCC)    Stable       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)    Stable Per heme/ onc      Disorder associated with type II diabetes mellitus ([place])    With neuropathy Stable  He is playing golf 3 days a week       Essential hypertension    Well controlled, no changes to meds. Encouraged heart healthy diet such as the DASH diet and exercise as tolerated.       Hyperlipidemia associated with type 2 diabetes mellitus ([place])    Tolerating statin, encouraged heart healthy diet, avoid trans fats, minimize simple carbs and saturated fats. Increase exercise as tolerated      Relevant Orders   Lipid panel   Comprehensive metabolic panel   Hemoglobin A1c   Microalbumin / creatinine urine ratio   PSA   Left arm swelling    Pt will have port removed and pacemaker moved to the right side      Other Visit Diagnoses     Type 2 DM with CKD stage 3 and hypertension ([place])    -  Primary   Relevant Orders   Lipid panel    Comprehensive metabolic panel   Hemoglobin A1c   Microalbumin / creatinine urine ratio   PSA   Primary hypertension       Relevant Orders   Lipid panel   Comprehensive metabolic panel   Hemoglobin A1c   Microalbumin / creatinine urine ratio   PSA   Atrial flutter, unspecified type (HCC)       Relevant Orders   Lipid panel   Comprehensive metabolic panel   Hemoglobin A1c   Microalbumin / creatinine urine ratio   PSA   Chronic renal insufficiency, stage 3 (moderate) (HCC)       Relevant Orders   Lipid panel   Comprehensive metabolic panel   Hemoglobin A1c   Microalbumin / creatinine urine ratio   PSA   Nocturia       Relevant Orders   PSA   Gastroesophageal reflux disease, unspecified whether esophagitis present       Relevant Medications   esomeprazole (NEXIUM) 40 MG capsule       Meds ordered this encounter  Medications   esomeprazole (NEXIUM) 40 MG capsule    Sig: Take 1 capsule (40 mg total) by mouth daily at 12 noon.    Dispense:  90 capsule    Refill:  3    Follow-up: Return in about 6 months (around 01/23/2022), or if symptoms worsen or fail to improve, for hypertension, hyperlipidemia, diabetes II.    [name], DO

## 2021-07-26 NOTE — Assessment & Plan Note (Signed)
Tolerating statin, encouraged heart healthy diet, avoid trans fats, minimize simple carbs and saturated fats. Increase exercise as tolerated 

## 2021-07-26 NOTE — Assessment & Plan Note (Signed)
Pt will have port removed and pacemaker moved to the right side

## 2021-07-26 NOTE — Patient Instructions (Signed)

## 2021-07-26 NOTE — Assessment & Plan Note (Signed)
Stable Per heme/ onc

## 2021-07-27 ENCOUNTER — Encounter: Payer: Self-pay | Admitting: Family

## 2021-07-27 ENCOUNTER — Inpatient Hospital Stay: Payer: Medicare Other

## 2021-07-27 ENCOUNTER — Inpatient Hospital Stay: Payer: Medicare Other | Attending: Hematology & Oncology

## 2021-07-27 ENCOUNTER — Telehealth: Payer: Self-pay | Admitting: *Deleted

## 2021-07-27 ENCOUNTER — Inpatient Hospital Stay (HOSPITAL_BASED_OUTPATIENT_CLINIC_OR_DEPARTMENT_OTHER): Payer: Medicare Other | Admitting: Family

## 2021-07-27 ENCOUNTER — Other Ambulatory Visit: Payer: Self-pay

## 2021-07-27 VITALS — BP 129/57 | HR 75 | Temp 97.8°F | Resp 16 | Ht 72.0 in | Wt 252.0 lb

## 2021-07-27 DIAGNOSIS — M7989 Other specified soft tissue disorders: Secondary | ICD-10-CM | POA: Insufficient documentation

## 2021-07-27 DIAGNOSIS — D500 Iron deficiency anemia secondary to blood loss (chronic): Secondary | ICD-10-CM | POA: Diagnosis not present

## 2021-07-27 DIAGNOSIS — G629 Polyneuropathy, unspecified: Secondary | ICD-10-CM | POA: Insufficient documentation

## 2021-07-27 DIAGNOSIS — D519 Vitamin B12 deficiency anemia, unspecified: Secondary | ICD-10-CM | POA: Diagnosis not present

## 2021-07-27 DIAGNOSIS — C8332 Diffuse large B-cell lymphoma, intrathoracic lymph nodes: Secondary | ICD-10-CM

## 2021-07-27 DIAGNOSIS — C8330 Diffuse large B-cell lymphoma, unspecified site: Secondary | ICD-10-CM | POA: Insufficient documentation

## 2021-07-27 DIAGNOSIS — Z79899 Other long term (current) drug therapy: Secondary | ICD-10-CM | POA: Insufficient documentation

## 2021-07-27 DIAGNOSIS — D510 Vitamin B12 deficiency anemia due to intrinsic factor deficiency: Secondary | ICD-10-CM | POA: Insufficient documentation

## 2021-07-27 DIAGNOSIS — Z886 Allergy status to analgesic agent status: Secondary | ICD-10-CM | POA: Insufficient documentation

## 2021-07-27 DIAGNOSIS — D508 Other iron deficiency anemias: Secondary | ICD-10-CM

## 2021-07-27 LAB — CMP (CANCER CENTER ONLY)
ALT: 12 U/L (ref 0–44)
AST: 15 U/L (ref 15–41)
Albumin: 4.6 g/dL (ref 3.5–5.0)
Alkaline Phosphatase: 68 U/L (ref 38–126)
Anion gap: 10 (ref 5–15)
BUN: 28 mg/dL — ABNORMAL HIGH (ref 8–23)
CO2: 27 mmol/L (ref 22–32)
Calcium: 10.4 mg/dL — ABNORMAL HIGH (ref 8.9–10.3)
Chloride: 103 mmol/L (ref 98–111)
Creatinine: 1.52 mg/dL — ABNORMAL HIGH (ref 0.61–1.24)
GFR, Estimated: 45 mL/min — ABNORMAL LOW (ref >60)
Glucose, Bld: 125 mg/dL — ABNORMAL HIGH (ref 70–99)
Potassium: 4.1 mmol/L (ref 3.5–5.1)
Sodium: 140 mmol/L (ref 135–145)
Total Bilirubin: 0.5 mg/dL (ref 0.3–1.2)
Total Protein: 7 g/dL (ref 6.5–8.1)

## 2021-07-27 LAB — CBC WITH DIFFERENTIAL (CANCER CENTER ONLY)
Abs Immature Granulocytes: 0.03 10*3/uL (ref 0.00–0.07)
Basophils Absolute: 0.1 10*3/uL (ref 0.0–0.1)
Basophils Relative: 1 %
Eosinophils Absolute: 0.2 10*3/uL (ref 0.0–0.5)
Eosinophils Relative: 2 %
HCT: 39.1 % (ref 39.0–52.0)
Hemoglobin: 12.7 g/dL — ABNORMAL LOW (ref 13.0–17.0)
Immature Granulocytes: 0 %
Lymphocytes Relative: 20 %
Lymphs Abs: 1.5 10*3/uL (ref 0.7–4.0)
MCH: 29 pg (ref 26.0–34.0)
MCHC: 32.5 g/dL (ref 30.0–36.0)
MCV: 89.3 fL (ref 80.0–100.0)
Monocytes Absolute: 0.5 10*3/uL (ref 0.1–1.0)
Monocytes Relative: 6 %
Neutro Abs: 5.4 10*3/uL (ref 1.7–7.7)
Neutrophils Relative %: 71 %
Platelet Count: 189 10*3/uL (ref 150–400)
RBC: 4.38 MIL/uL (ref 4.22–5.81)
RDW: 16 % — ABNORMAL HIGH (ref 11.5–15.5)
WBC Count: 7.6 10*3/uL (ref 4.0–10.5)
nRBC: 0 % (ref 0.0–0.2)

## 2021-07-27 LAB — IRON AND IRON BINDING CAPACITY (CC-WL,HP ONLY)
Iron: 71 ug/dL (ref 45–182)
Saturation Ratios: 18 % (ref 17.9–39.5)
TIBC: 403 ug/dL (ref 250–450)
UIBC: 332 ug/dL (ref 117–376)

## 2021-07-27 LAB — RETICULOCYTES
Immature Retic Fract: 26.8 % — ABNORMAL HIGH (ref 2.3–15.9)
RBC.: 4.44 MIL/uL (ref 4.22–5.81)
Retic Count, Absolute: 80.4 10*3/uL (ref 19.0–186.0)
Retic Ct Pct: 1.8 % (ref 0.4–3.1)

## 2021-07-27 LAB — VITAMIN B12: Vitamin B-12: 190 pg/mL (ref 180–914)

## 2021-07-27 MED ORDER — CYANOCOBALAMIN 1000 MCG/ML IJ SOLN
1000.0000 ug | Freq: Once | INTRAMUSCULAR | Status: AC
  Administered 2021-07-27: 1000 ug via INTRAMUSCULAR
  Filled 2021-07-27: qty 1

## 2021-07-27 NOTE — Telephone Encounter (Signed)
Per 07/27/21 los - gave upcoming appointments

## 2021-07-27 NOTE — Patient Instructions (Signed)

## 2021-07-27 NOTE — Patient Instructions (Signed)
Vitamin B12 Deficiency Vitamin B12 deficiency means that your body does not have enough vitamin B12. The body needs this important vitamin: To make red blood cells. To make genes (DNA). To help the nerves work. If you do not have enough vitamin B12 in your body, you can have health problems, such as not having enough red blood cells in the blood (anemia). What are the causes? Not eating enough foods that contain vitamin B12. Not being able to take in (absorb) vitamin B12 from the food that you eat. Certain diseases. A condition in which the body does not make enough of a certain protein. This results in your body not taking in enough vitamin B12. Having a surgery in which part of the stomach or small intestine is taken out. Taking medicines that make it hard for the body to take in vitamin B12. These include: Heartburn medicines. Some medicines that are used to treat diabetes. What increases the risk? Being an older adult. Eating a vegetarian or vegan diet that does not include any foods that come from animals. Not eating enough foods that contain vitamin B12 while you are pregnant. Taking certain medicines. Having alcoholism. What are the signs or symptoms? In some cases, there are no symptoms. If the condition leads to too few blood cells or nerve damage, symptoms can occur, such as: Feeling weak or tired. Not being hungry. Losing feeling (numbness) or tingling in your hands and feet. Redness and burning of the tongue. Feeling sad (depressed). Confusion or memory problems. Trouble walking. If anemia is very bad, symptoms can include: Being short of breath. Being dizzy. Having a very fast heartbeat. How is this treated? Changing the way you eat and drink, such as: Eating more foods that contain vitamin B12. Drinking little or no alcohol. Getting vitamin B12 shots. Taking vitamin B12 supplements by mouth (orally). Your doctor will tell you the dose that is best for you. Follow  these instructions at home: Eating and drinking  Eat foods that come from animals and have a lot of vitamin B12 in them. These include: Meats and poultry. This includes beef, pork, chicken, turkey, and organ meats, such as liver. Seafood, such as clams, rainbow trout, salmon, tuna, and haddock. Eggs. Dairy foods such as milk, yogurt, and cheese. Eat breakfast cereals that have vitamin B12 added to them (are fortified). Check the label. The items listed above may not be a complete list of foods and beverages you can eat and drink. Contact a dietitian for more information. Alcohol use Do not drink alcohol if: Your doctor tells you not to drink. You are pregnant, may be pregnant, or are planning to become pregnant. If you drink alcohol: Limit how much you have to: 0-1 drink a day for women. 0-2 drinks a day for men. Know how much alcohol is in your drink. In the [place] one drink equals one 12 oz bottle of beer (355 mL), one 5 oz glass of wine (148 mL), or one 1 oz glass of hard liquor (44 mL). General instructions Get any vitamin B12 shots if told by your doctor. Take supplements only as told by your doctor. Follow the directions. Keep all follow-up visits. Contact a doctor if: Your symptoms come back. Your symptoms get worse or do not get better with treatment. Get help right away if: You have trouble breathing. You have a very fast heartbeat. You have chest pain. You get dizzy. You faint. These symptoms may be an emergency. Get help right away. Call 911.   Do not wait to see if the symptoms will go away. Do not drive yourself to the hospital. Summary Vitamin B12 deficiency means that your body is not getting enough of the vitamin. In some cases, there are no symptoms of this condition. Treatment may include making a change in the way you eat and drink, getting shots, or taking supplements. Eat foods that have vitamin B12 in them. This information is not intended to replace advice  given to you by your health care provider. Make sure you discuss any questions you have with your health care provider. Document Revised: 02/09/2021 Document Reviewed: 02/09/2021 Elsevier Patient Education  2022 [employer] Inc.  

## 2021-07-27 NOTE — Progress Notes (Signed)
Hematology and Oncology Follow Up Visit  Richard CALABRETTA [id number] 1941-01-02 81 y.o. 07/27/2021   Principle Diagnosis:  Diffuse large cell non-Hodgkin's lymphoma (IPI = 3) - NOT "double hit" Pernicious anemia Iron deficiency secondary to bleeding   Past Therapy: R-CHOP - s/p cycle 8 - completed 08/2017   Current Therapy:        Vitamin B12 1 mg IM every month Xgeva 120 mg subcu q 3 months - next dose due in 05/2021 IV iron as indicated    Interim History:  [name] is here today for follow-up and B 12. He is doing quite well and states that he will be having his port removed on 2/13 and that vascular will be assessing his veins at that time. They also plan to move his pacemaker from the left chest to the right at a later date.  The swelling in the left arm is stable. Radial pulses are 2+.  Neuropathy in his lower extremities is unchanged from baseline. Pedal pulses are 2+.  No fever, chills, n/v, cough, rash, dizziness, SOB, chest pain, palpitations, abdominal pain or changes in bowel or bladder habits.  No falls or syncope to report.  Sadly it has been too rainy and wet for him to golf the last couple weeks.  He has a good appetite and is staying well hydrated. His weight is stable at 252 lbs.   ECOG Performance Status: 1 - Symptomatic but completely ambulatory  Medications:  Allergies as of 07/27/2021       Reactions   Benazepril Swelling   angioedema; he is not a candidate for any angiotensin receptor blockers because of this significant allergic reaction. Because of a history of documented adverse serious drug reaction;Medi Alert bracelet  is recommended   Hctz [hydrochlorothiazide] Anaphylaxis, Swelling   Tongue and lip swelling   Benazepril Hcl Other (See Comments)   Aspirin Other (See Comments)   Gastritis, cant take 325 Mg aspirin    Lactose Intolerance (gi) Nausea And Vomiting        Medication List        Accurate as of July 27, 2021  1:23 PM. If you  have any questions, ask your nurse or doctor.          acetaminophen 325 MG tablet Commonly known as: TYLENOL Take 650 mg by mouth at bedtime as needed for moderate pain or headache.   allopurinol 300 MG tablet Commonly known as: ZYLOPRIM Take 450 mg by mouth daily.   aluminum hydroxide-magnesium carbonate 95-358 MG/15ML Susp Commonly known as: GAVISCON Take 15 mLs by mouth as needed for indigestion or heartburn.   amLODipine 5 MG tablet Commonly known as: NORVASC TAKE 1 TABLET BY MOUTH TWICE A DAY   apixaban 5 MG Tabs tablet Commonly known as: ELIQUIS Take 1 tablet (5 mg total) by mouth 2 (two) times daily.   atorvastatin 80 MG tablet Commonly known as: LIPITOR TAKE ONE TABLET BY MOUTH AT BEDTIME   azelastine 0.1 % nasal spray Commonly known as: ASTELIN Place 2 sprays into both nostrils at bedtime as needed for rhinitis or allergies. What changed: when to take this   dapagliflozin propanediol 10 MG Tabs tablet Commonly known as: FARXIGA Take 10 mg by mouth daily.   esomeprazole 40 MG capsule Commonly known as: NexIUM Take 1 capsule (40 mg total) by mouth daily.   esomeprazole 40 MG capsule Commonly known as: NEXIUM TAKE 1 CAPSULE (40 MG TOTAL) BY MOUTH DAILY AT 12 NOON.   eye  wash Soln Place 1 drop into both eyes at bedtime. [facility] Soothing Eye Wash   ezetimibe 10 MG tablet Commonly known as: ZETIA TAKE 1 TABLET BY MOUTH EVERY DAY   fenofibrate 160 MG tablet TAKE 1 TABLET BY MOUTH EVERY DAY   folic acid 1 MG tablet Commonly known as: FOLVITE TAKE 2 TABLETS BY MOUTH EVERY DAY   freestyle lancets USE TWICE A DAY TO CHECK BLOOD SUGAR. DX E11.9   FREESTYLE LITE test strip Generic drug: glucose blood USE TO TEST BLOOD SUGAR ONCE A DAY. DX CODE: E11.9   glimepiride 1 MG tablet Commonly known as: AMARYL TAKE 1 TABLET BY MOUTH EVERY DAY WITH BREAKFAST   icosapent Ethyl 1 g capsule Commonly known as: VASCEPA TAKE 2 CAPSULES BY MOUTH TWICE A DAY    ICY HOT ADVANCED PAIN RELIEF EX Apply 1 application topically daily. Roll-on   Klor-Con M20 20 MEQ tablet Generic drug: potassium chloride SA TAKE 2 TABLETS (40 MEQ TOTAL) BY MOUTH 2 (TWO) TIMES DAILY.   metFORMIN 1000 MG tablet Commonly known as: GLUCOPHAGE TAKE 1 & 1/2 TABLETS BY MOUTH EVERY MORNING AND 1 TABLET IN THE EVENING.   metoprolol tartrate 25 MG tablet Commonly known as: LOPRESSOR TAKE ONE-HALF TABLET BY MOUTH TWICE DAILY   nitroGLYCERIN 0.4 MG SL tablet Commonly known as: NITROSTAT PLACE 1 TABLET (0.4 MG TOTAL) UNDER THE TONGUE EVERY 5 (FIVE) MINUTES AS NEEDED FOR CHEST PAIN.   pregabalin 150 MG capsule Commonly known as: LYRICA TAKE 1 CAPSULE BY MOUTH TWICE A DAY   psyllium 58.6 % powder Commonly known as: METAMUCIL Take 1 packet by mouth daily as needed (constipation).   torsemide 20 MG tablet Commonly known as: DEMADEX Take 20 mg by mouth daily.        Allergies:  Allergies  Allergen Reactions   Benazepril Swelling    angioedema; he is not a candidate for any angiotensin receptor blockers because of this significant allergic reaction. Because of a history of documented adverse serious drug reaction;Medi Alert bracelet  is recommended   Hctz [Hydrochlorothiazide] Anaphylaxis and Swelling    Tongue and lip swelling    Benazepril Hcl Other (See Comments)   Aspirin Other (See Comments)    Gastritis, cant take 325 Mg aspirin    Lactose Intolerance (Gi) Nausea And Vomiting    Past Medical History, Surgical history, Social history, and Family History were reviewed and updated.  Review of Systems: All other 10 point review of systems is negative.   Physical Exam:  height is 6' (1.829 m) and weight is 252 lb (114.3 kg). His oral temperature is 97.8 F (36.6 C). His blood pressure is 129/57 (abnormal) and his pulse is 75. His respiration is 16 and oxygen saturation is 96%.   Wt Readings from Last 3 Encounters:  07/27/21 252 lb (114.3 kg)  07/26/21  253 lb (114.8 kg)  06/27/21 252 lb 0.6 oz (114.3 kg)    Ocular: Sclerae unicteric, pupils equal, round and reactive to light Ear-nose-throat: Oropharynx clear, dentition fair Lymphatic: No cervical or supraclavicular adenopathy Lungs no rales or rhonchi, good excursion bilaterally Heart regular rate and rhythm, no murmur appreciated Abd soft, nontender, positive bowel sounds MSK no focal spinal tenderness, no joint edema Neuro: non-focal, well-oriented, appropriate affect Breasts: Deferred   Lab Results  Component Value Date   WBC 7.6 07/27/2021   HGB 12.7 (L) 07/27/2021   HCT 39.1 07/27/2021   MCV 89.3 07/27/2021   PLT 189 07/27/2021   Lab Results  Component Value Date   FERRITIN 1,579 (H) 06/27/2021   IRON 75 06/27/2021   TIBC 330 06/27/2021   UIBC 255 06/27/2021   IRONPCTSAT 23 06/27/2021   Lab Results  Component Value Date   RETICCTPCT 1.8 07/27/2021   RBC 4.44 07/27/2021   RETICCTABS 52.1 11/22/2011   No results found for: Nils Pyle [facility] Lab Results  Component Value Date   IGA 159 03/09/2012   Lab Results  Component Value Date   ALBUMINELP 4.2 07/27/2018   MSPIKE Not Observed 07/27/2018     Chemistry      Component Value Date/Time   NA 141 07/26/2021 0912   NA 136 03/16/2019 0938   NA 144 06/27/2017 0857   K 4.9 07/26/2021 0912   K 3.9 06/27/2017 0857   CL 103 07/26/2021 0912   CL 103 06/27/2017 0857   CO2 28 07/26/2021 0912   CO2 26 06/27/2017 0857   BUN 28 (H) 07/26/2021 0912   BUN 34 (H) 03/16/2019 0938   BUN 12 06/27/2017 0857   CREATININE 1.62 (H) 07/26/2021 0912   CREATININE 1.31 (H) 06/27/2021 0827   CREATININE 1.65 (H) 04/18/2020 0956      Component Value Date/Time   CALCIUM 10.5 07/26/2021 0912   CALCIUM 9.2 06/27/2017 0857   ALKPHOS 65 07/26/2021 0912   ALKPHOS 128 (H) 06/27/2017 0857   AST 14 07/26/2021 0912   AST 17 06/27/2021 0827   ALT 12 07/26/2021 0912   ALT 21 06/27/2021 0827   ALT 24 06/27/2017  0857   BILITOT 0.5 07/26/2021 0912   BILITOT 0.4 06/27/2021 0827       Impression and Plan: Mr. Brazel is a very pleasant 81 yo caucasian gentleman with diffuse large B-cell lymphoma (not "double hit" lymphoma). He completed treatment in February 2019.  B 12 injection given.  Iron studies pending.  Follow-up in 1 month.  Lottie Dawson, NP 1/27/20231:23 PM

## 2021-07-27 NOTE — Telephone Encounter (Signed)
PA sent.   Key: [id number]

## 2021-07-30 LAB — FERRITIN: Ferritin: 1435 ng/mL — ABNORMAL HIGH (ref 24–336)

## 2021-07-31 DIAGNOSIS — U071 COVID-19: Secondary | ICD-10-CM | POA: Diagnosis not present

## 2021-08-03 ENCOUNTER — Ambulatory Visit (INDEPENDENT_AMBULATORY_CARE_PROVIDER_SITE_OTHER): Payer: Medicare Other

## 2021-08-03 ENCOUNTER — Other Ambulatory Visit: Payer: Self-pay | Admitting: Family Medicine

## 2021-08-03 DIAGNOSIS — I442 Atrioventricular block, complete: Secondary | ICD-10-CM | POA: Diagnosis not present

## 2021-08-03 LAB — CUP PACEART REMOTE DEVICE CHECK
Battery Remaining Longevity: 24 mo
Battery Voltage: 2.91 V
Brady Statistic AP VP Percent: 7.96 %
Brady Statistic AP VS Percent: 0 %
Brady Statistic AS VP Percent: 91.3 %
Brady Statistic AS VS Percent: 0.74 %
Brady Statistic RA Percent Paced: 8.29 %
Brady Statistic RV Percent Paced: 99.26 %
Date Time Interrogation Session: 20230202214905
Implantable Lead Implant Date: 20190313
Implantable Lead Implant Date: 20190313
Implantable Lead Location: 753859
Implantable Lead Location: 753860
Implantable Lead Model: 3830
Implantable Lead Model: 5076
Implantable Pulse Generator Implant Date: 20190313
Lead Channel Impedance Value: 285 Ohm
Lead Channel Impedance Value: 323 Ohm
Lead Channel Impedance Value: 380 Ohm
Lead Channel Impedance Value: 418 Ohm
Lead Channel Pacing Threshold Amplitude: 0.875 V
Lead Channel Pacing Threshold Amplitude: 1.125 V
Lead Channel Pacing Threshold Pulse Width: 0.4 ms
Lead Channel Pacing Threshold Pulse Width: 0.4 ms
Lead Channel Sensing Intrinsic Amplitude: 1.375 mV
Lead Channel Sensing Intrinsic Amplitude: 1.375 mV
Lead Channel Sensing Intrinsic Amplitude: 1.625 mV
Lead Channel Sensing Intrinsic Amplitude: 1.625 mV
Lead Channel Setting Pacing Amplitude: 1.75 V
Lead Channel Setting Pacing Amplitude: 2.5 V
Lead Channel Setting Pacing Pulse Width: 1 ms
Lead Channel Setting Sensing Sensitivity: 1.2 mV

## 2021-08-06 NOTE — Telephone Encounter (Signed)
Request Reference Number: [id number]. ESOMEPRA MAG CAP 40MG  DR is denied for not meeting the prior authorization requirement(s). Details of this decision are in the notice attached below or have been faxed to you.

## 2021-08-07 ENCOUNTER — Ambulatory Visit: Payer: Medicare Other

## 2021-08-08 NOTE — Progress Notes (Signed)
Remote pacemaker transmission.   

## 2021-08-13 DIAGNOSIS — Z950 Presence of cardiac pacemaker: Secondary | ICD-10-CM | POA: Diagnosis not present

## 2021-08-13 DIAGNOSIS — I459 Conduction disorder, unspecified: Secondary | ICD-10-CM | POA: Diagnosis not present

## 2021-08-13 DIAGNOSIS — M7989 Other specified soft tissue disorders: Secondary | ICD-10-CM | POA: Diagnosis not present

## 2021-08-13 DIAGNOSIS — I340 Nonrheumatic mitral (valve) insufficiency: Secondary | ICD-10-CM | POA: Diagnosis not present

## 2021-08-13 DIAGNOSIS — I441 Atrioventricular block, second degree: Secondary | ICD-10-CM | POA: Diagnosis not present

## 2021-08-13 DIAGNOSIS — I517 Cardiomegaly: Secondary | ICD-10-CM | POA: Diagnosis not present

## 2021-08-13 DIAGNOSIS — I2510 Atherosclerotic heart disease of native coronary artery without angina pectoris: Secondary | ICD-10-CM | POA: Diagnosis not present

## 2021-08-13 DIAGNOSIS — I483 Typical atrial flutter: Secondary | ICD-10-CM | POA: Diagnosis not present

## 2021-08-13 DIAGNOSIS — I871 Compression of vein: Secondary | ICD-10-CM | POA: Diagnosis not present

## 2021-08-13 DIAGNOSIS — G4733 Obstructive sleep apnea (adult) (pediatric): Secondary | ICD-10-CM | POA: Diagnosis not present

## 2021-08-13 DIAGNOSIS — I480 Paroxysmal atrial fibrillation: Secondary | ICD-10-CM | POA: Diagnosis not present

## 2021-08-13 DIAGNOSIS — I442 Atrioventricular block, complete: Secondary | ICD-10-CM | POA: Diagnosis not present

## 2021-08-13 DIAGNOSIS — Z951 Presence of aortocoronary bypass graft: Secondary | ICD-10-CM | POA: Diagnosis not present

## 2021-08-13 DIAGNOSIS — R600 Localized edema: Secondary | ICD-10-CM | POA: Diagnosis not present

## 2021-08-13 DIAGNOSIS — Z452 Encounter for adjustment and management of vascular access device: Secondary | ICD-10-CM | POA: Diagnosis not present

## 2021-08-13 DIAGNOSIS — E1165 Type 2 diabetes mellitus with hyperglycemia: Secondary | ICD-10-CM | POA: Diagnosis not present

## 2021-08-13 DIAGNOSIS — R001 Bradycardia, unspecified: Secondary | ICD-10-CM | POA: Diagnosis not present

## 2021-08-16 ENCOUNTER — Telehealth: Payer: Self-pay | Admitting: Family Medicine

## 2021-08-16 ENCOUNTER — Other Ambulatory Visit: Payer: Self-pay | Admitting: Family Medicine

## 2021-08-16 DIAGNOSIS — G609 Hereditary and idiopathic neuropathy, unspecified: Secondary | ICD-10-CM

## 2021-08-16 MED ORDER — PREGABALIN 150 MG PO CAPS: 150.0000 mg | ORAL_CAPSULE | Freq: Two times a day (BID) | ORAL | 1 refills | Status: DC

## 2021-08-16 NOTE — Telephone Encounter (Signed)
Medication:  pregabalin (LYRICA) 150 MG capsule [[id number]]     Has the patient contacted their pharmacy? No. (If no, request that the patient contact the pharmacy for the refill.) (If yes, when and what did the pharmacy advise?)     Preferred Pharmacy (with phone number or street name):  [facility] [place] - [address]  [place], [place] [address]  Phone:  [phone]  Fax:  [phone]     Agent: Please be advised that RX refills may take up to 3 business days. We ask that you follow-up with your pharmacy.   Pt stated he needs meds today or he will start to have withdrawals.

## 2021-08-16 NOTE — Telephone Encounter (Signed)
Requesting: Lyrica 150MG  Contract: 10/20/12 UDS: None Last Visit: 07/26/21 Next Visit: 09/25/21 Last Refill: 05/16/21, #180, 0 refills  Please Advise

## 2021-08-17 ENCOUNTER — Telehealth: Payer: Self-pay

## 2021-08-17 ENCOUNTER — Other Ambulatory Visit: Payer: Self-pay

## 2021-08-17 ENCOUNTER — Other Ambulatory Visit: Payer: Self-pay | Admitting: Family Medicine

## 2021-08-17 ENCOUNTER — Other Ambulatory Visit: Payer: Self-pay | Admitting: Cardiology

## 2021-08-17 MED ORDER — APIXABAN 2.5 MG PO TABS: 2.5000 mg | ORAL_TABLET | Freq: Two times a day (BID) | ORAL | 0 refills | Status: DC

## 2021-08-17 NOTE — Telephone Encounter (Signed)
Prescription refill request for Eliquis received. Indication:Afib Last office visit:11/22 Scr:1.52 Age: 81 Weight:114.3 kg  May need Eliquis dose reduced.

## 2021-08-17 NOTE — Telephone Encounter (Signed)
I spoke to the patient and informed him of Eliquis dose change to 2.5 mg bid.  He verbalized understanding

## 2021-08-17 NOTE — Telephone Encounter (Signed)
Yes please dose reduce to 2.5mg  BID.  Looks like patient has been on 2.5mg  before but renal function improved last year.  Would only send 90 day supply.

## 2021-08-19 DIAGNOSIS — E785 Hyperlipidemia, unspecified: Secondary | ICD-10-CM | POA: Diagnosis not present

## 2021-08-19 DIAGNOSIS — M48061 Spinal stenosis, lumbar region without neurogenic claudication: Secondary | ICD-10-CM | POA: Diagnosis not present

## 2021-08-19 DIAGNOSIS — Z8572 Personal history of non-Hodgkin lymphomas: Secondary | ICD-10-CM | POA: Diagnosis not present

## 2021-08-19 DIAGNOSIS — R918 Other nonspecific abnormal finding of lung field: Secondary | ICD-10-CM | POA: Diagnosis not present

## 2021-08-19 DIAGNOSIS — I871 Compression of vein: Secondary | ICD-10-CM | POA: Diagnosis not present

## 2021-08-19 DIAGNOSIS — I110 Hypertensive heart disease with heart failure: Secondary | ICD-10-CM | POA: Diagnosis not present

## 2021-08-19 DIAGNOSIS — I4892 Unspecified atrial flutter: Secondary | ICD-10-CM | POA: Diagnosis not present

## 2021-08-19 DIAGNOSIS — Z7901 Long term (current) use of anticoagulants: Secondary | ICD-10-CM | POA: Diagnosis not present

## 2021-08-19 DIAGNOSIS — Z950 Presence of cardiac pacemaker: Secondary | ICD-10-CM | POA: Diagnosis not present

## 2021-08-19 DIAGNOSIS — I442 Atrioventricular block, complete: Secondary | ICD-10-CM | POA: Diagnosis not present

## 2021-08-19 DIAGNOSIS — Z45018 Encounter for adjustment and management of other part of cardiac pacemaker: Secondary | ICD-10-CM | POA: Diagnosis not present

## 2021-08-19 DIAGNOSIS — I5032 Chronic diastolic (congestive) heart failure: Secondary | ICD-10-CM | POA: Diagnosis not present

## 2021-08-19 DIAGNOSIS — I252 Old myocardial infarction: Secondary | ICD-10-CM | POA: Diagnosis not present

## 2021-08-19 DIAGNOSIS — I480 Paroxysmal atrial fibrillation: Secondary | ICD-10-CM | POA: Diagnosis not present

## 2021-08-19 DIAGNOSIS — G4733 Obstructive sleep apnea (adult) (pediatric): Secondary | ICD-10-CM | POA: Diagnosis not present

## 2021-08-19 DIAGNOSIS — Z79899 Other long term (current) drug therapy: Secondary | ICD-10-CM | POA: Diagnosis not present

## 2021-08-19 DIAGNOSIS — I483 Typical atrial flutter: Secondary | ICD-10-CM | POA: Diagnosis not present

## 2021-08-19 DIAGNOSIS — R600 Localized edema: Secondary | ICD-10-CM | POA: Diagnosis not present

## 2021-08-19 DIAGNOSIS — I498 Other specified cardiac arrhythmias: Secondary | ICD-10-CM | POA: Diagnosis not present

## 2021-08-19 DIAGNOSIS — Z006 Encounter for examination for normal comparison and control in clinical research program: Secondary | ICD-10-CM | POA: Diagnosis not present

## 2021-08-19 DIAGNOSIS — M1990 Unspecified osteoarthritis, unspecified site: Secondary | ICD-10-CM | POA: Diagnosis not present

## 2021-08-19 DIAGNOSIS — Z953 Presence of xenogenic heart valve: Secondary | ICD-10-CM | POA: Diagnosis not present

## 2021-08-19 DIAGNOSIS — I2510 Atherosclerotic heart disease of native coronary artery without angina pectoris: Secondary | ICD-10-CM | POA: Diagnosis not present

## 2021-08-19 DIAGNOSIS — M7989 Other specified soft tissue disorders: Secondary | ICD-10-CM | POA: Diagnosis not present

## 2021-08-19 DIAGNOSIS — R001 Bradycardia, unspecified: Secondary | ICD-10-CM | POA: Diagnosis not present

## 2021-08-20 DIAGNOSIS — J90 Pleural effusion, not elsewhere classified: Secondary | ICD-10-CM | POA: Diagnosis not present

## 2021-08-20 DIAGNOSIS — I483 Typical atrial flutter: Secondary | ICD-10-CM | POA: Diagnosis not present

## 2021-08-20 DIAGNOSIS — R600 Localized edema: Secondary | ICD-10-CM | POA: Diagnosis not present

## 2021-08-20 DIAGNOSIS — I509 Heart failure, unspecified: Secondary | ICD-10-CM | POA: Diagnosis not present

## 2021-08-20 DIAGNOSIS — I499 Cardiac arrhythmia, unspecified: Secondary | ICD-10-CM | POA: Diagnosis not present

## 2021-08-20 DIAGNOSIS — Z45018 Encounter for adjustment and management of other part of cardiac pacemaker: Secondary | ICD-10-CM | POA: Diagnosis not present

## 2021-08-20 DIAGNOSIS — I442 Atrioventricular block, complete: Secondary | ICD-10-CM | POA: Diagnosis not present

## 2021-08-20 DIAGNOSIS — I871 Compression of vein: Secondary | ICD-10-CM | POA: Diagnosis not present

## 2021-08-20 DIAGNOSIS — I4729 Other ventricular tachycardia: Secondary | ICD-10-CM | POA: Diagnosis not present

## 2021-08-20 DIAGNOSIS — Z006 Encounter for examination for normal comparison and control in clinical research program: Secondary | ICD-10-CM | POA: Diagnosis not present

## 2021-08-20 DIAGNOSIS — M7989 Other specified soft tissue disorders: Secondary | ICD-10-CM | POA: Diagnosis not present

## 2021-08-20 DIAGNOSIS — Z950 Presence of cardiac pacemaker: Secondary | ICD-10-CM | POA: Diagnosis not present

## 2021-08-20 DIAGNOSIS — I480 Paroxysmal atrial fibrillation: Secondary | ICD-10-CM | POA: Diagnosis not present

## 2021-08-21 ENCOUNTER — Ambulatory Visit: Payer: Medicare Other

## 2021-08-21 DIAGNOSIS — Z45018 Encounter for adjustment and management of other part of cardiac pacemaker: Secondary | ICD-10-CM | POA: Diagnosis not present

## 2021-08-21 DIAGNOSIS — I480 Paroxysmal atrial fibrillation: Secondary | ICD-10-CM | POA: Diagnosis not present

## 2021-08-21 DIAGNOSIS — Z9889 Other specified postprocedural states: Secondary | ICD-10-CM | POA: Diagnosis not present

## 2021-08-21 DIAGNOSIS — Z006 Encounter for examination for normal comparison and control in clinical research program: Secondary | ICD-10-CM | POA: Diagnosis not present

## 2021-08-21 DIAGNOSIS — I483 Typical atrial flutter: Secondary | ICD-10-CM | POA: Diagnosis not present

## 2021-08-21 DIAGNOSIS — I97190 Other postprocedural cardiac functional disturbances following cardiac surgery: Secondary | ICD-10-CM | POA: Diagnosis not present

## 2021-08-21 DIAGNOSIS — I871 Compression of vein: Secondary | ICD-10-CM | POA: Diagnosis not present

## 2021-08-21 DIAGNOSIS — Z952 Presence of prosthetic heart valve: Secondary | ICD-10-CM | POA: Diagnosis not present

## 2021-08-21 DIAGNOSIS — R600 Localized edema: Secondary | ICD-10-CM | POA: Diagnosis not present

## 2021-08-21 DIAGNOSIS — I442 Atrioventricular block, complete: Secondary | ICD-10-CM | POA: Diagnosis not present

## 2021-08-21 DIAGNOSIS — Z950 Presence of cardiac pacemaker: Secondary | ICD-10-CM | POA: Diagnosis not present

## 2021-08-22 DIAGNOSIS — I499 Cardiac arrhythmia, unspecified: Secondary | ICD-10-CM | POA: Diagnosis not present

## 2021-08-28 ENCOUNTER — Other Ambulatory Visit: Payer: Self-pay

## 2021-08-28 ENCOUNTER — Inpatient Hospital Stay (HOSPITAL_BASED_OUTPATIENT_CLINIC_OR_DEPARTMENT_OTHER): Payer: Medicare Other | Admitting: Family

## 2021-08-28 ENCOUNTER — Inpatient Hospital Stay: Payer: Medicare Other

## 2021-08-28 ENCOUNTER — Inpatient Hospital Stay: Payer: Medicare Other | Attending: Hematology & Oncology

## 2021-08-28 ENCOUNTER — Encounter: Payer: Self-pay | Admitting: Family

## 2021-08-28 VITALS — BP 119/63 | Temp 97.8°F | Resp 18 | Wt 249.0 lb

## 2021-08-28 DIAGNOSIS — D500 Iron deficiency anemia secondary to blood loss (chronic): Secondary | ICD-10-CM

## 2021-08-28 DIAGNOSIS — D519 Vitamin B12 deficiency anemia, unspecified: Secondary | ICD-10-CM

## 2021-08-28 DIAGNOSIS — D510 Vitamin B12 deficiency anemia due to intrinsic factor deficiency: Secondary | ICD-10-CM | POA: Insufficient documentation

## 2021-08-28 DIAGNOSIS — C8332 Diffuse large B-cell lymphoma, intrathoracic lymph nodes: Secondary | ICD-10-CM

## 2021-08-28 DIAGNOSIS — D508 Other iron deficiency anemias: Secondary | ICD-10-CM

## 2021-08-28 LAB — CBC WITH DIFFERENTIAL (CANCER CENTER ONLY)
Abs Immature Granulocytes: 0.07 10*3/uL (ref 0.00–0.07)
Basophils Absolute: 0.1 10*3/uL (ref 0.0–0.1)
Basophils Relative: 1 %
Eosinophils Absolute: 0.2 10*3/uL (ref 0.0–0.5)
Eosinophils Relative: 2 %
HCT: 39.4 % (ref 39.0–52.0)
Hemoglobin: 12.6 g/dL — ABNORMAL LOW (ref 13.0–17.0)
Immature Granulocytes: 1 %
Lymphocytes Relative: 20 %
Lymphs Abs: 1.8 10*3/uL (ref 0.7–4.0)
MCH: 28.6 pg (ref 26.0–34.0)
MCHC: 32 g/dL (ref 30.0–36.0)
MCV: 89.5 fL (ref 80.0–100.0)
Monocytes Absolute: 0.7 10*3/uL (ref 0.1–1.0)
Monocytes Relative: 7 %
Neutro Abs: 6.3 10*3/uL (ref 1.7–7.7)
Neutrophils Relative %: 69 %
Platelet Count: 226 10*3/uL (ref 150–400)
RBC: 4.4 MIL/uL (ref 4.22–5.81)
RDW: 16.9 % — ABNORMAL HIGH (ref 11.5–15.5)
WBC Count: 9 10*3/uL (ref 4.0–10.5)
nRBC: 0 % (ref 0.0–0.2)

## 2021-08-28 LAB — CMP (CANCER CENTER ONLY)
ALT: 10 U/L (ref 0–44)
AST: 14 U/L — ABNORMAL LOW (ref 15–41)
Albumin: 4.3 g/dL (ref 3.5–5.0)
Alkaline Phosphatase: 56 U/L (ref 38–126)
Anion gap: 10 (ref 5–15)
BUN: 30 mg/dL — ABNORMAL HIGH (ref 8–23)
CO2: 28 mmol/L (ref 22–32)
Calcium: 10.3 mg/dL (ref 8.9–10.3)
Chloride: 100 mmol/L (ref 98–111)
Creatinine: 1.63 mg/dL — ABNORMAL HIGH (ref 0.61–1.24)
GFR, Estimated: 42 mL/min — ABNORMAL LOW (ref >60)
Glucose, Bld: 115 mg/dL — ABNORMAL HIGH (ref 70–99)
Potassium: 4.7 mmol/L (ref 3.5–5.1)
Sodium: 138 mmol/L (ref 135–145)
Total Bilirubin: 0.6 mg/dL (ref 0.3–1.2)
Total Protein: 6.9 g/dL (ref 6.5–8.1)

## 2021-08-28 LAB — IRON AND IRON BINDING CAPACITY (CC-WL,HP ONLY)
Iron: 73 ug/dL (ref 45–182)
Saturation Ratios: 20 % (ref 17.9–39.5)
TIBC: 372 ug/dL (ref 250–450)
UIBC: 299 ug/dL (ref 117–376)

## 2021-08-28 LAB — RETICULOCYTES
Immature Retic Fract: 28.6 % — ABNORMAL HIGH (ref 2.3–15.9)
RBC.: 4.4 MIL/uL (ref 4.22–5.81)
Retic Count, Absolute: 82.7 10*3/uL (ref 19.0–186.0)
Retic Ct Pct: 1.9 % (ref 0.4–3.1)

## 2021-08-28 LAB — LACTATE DEHYDROGENASE: LDH: 152 U/L (ref 98–192)

## 2021-08-28 LAB — VITAMIN B12: Vitamin B-12: 149 pg/mL — ABNORMAL LOW (ref 180–914)

## 2021-08-28 MED ORDER — CYANOCOBALAMIN 1000 MCG/ML IJ SOLN
1000.0000 ug | Freq: Once | INTRAMUSCULAR | Status: AC
  Administered 2021-08-28: 1000 ug via INTRAMUSCULAR
  Filled 2021-08-28: qty 1

## 2021-08-28 NOTE — Patient Instructions (Signed)
Vitamin B12 Deficiency Vitamin B12 deficiency means that your body does not have enough vitamin B12. The body needs this important vitamin: To make red blood cells. To make genes (DNA). To help the nerves work. If you do not have enough vitamin B12 in your body, you can have health problems, such as not having enough red blood cells in the blood (anemia). What are the causes? Not eating enough foods that contain vitamin B12. Not being able to take in (absorb) vitamin B12 from the food that you eat. Certain diseases. A condition in which the body does not make enough of a certain protein. This results in your body not taking in enough vitamin B12. Having a surgery in which part of the stomach or small intestine is taken out. Taking medicines that make it hard for the body to take in vitamin B12. These include: Heartburn medicines. Some medicines that are used to treat diabetes. What increases the risk? Being an older adult. Eating a vegetarian or vegan diet that does not include any foods that come from animals. Not eating enough foods that contain vitamin B12 while you are pregnant. Taking certain medicines. Having alcoholism. What are the signs or symptoms? In some cases, there are no symptoms. If the condition leads to too few blood cells or nerve damage, symptoms can occur, such as: Feeling weak or tired. Not being hungry. Losing feeling (numbness) or tingling in your hands and feet. Redness and burning of the tongue. Feeling sad (depressed). Confusion or memory problems. Trouble walking. If anemia is very bad, symptoms can include: Being short of breath. Being dizzy. Having a very fast heartbeat. How is this treated? Changing the way you eat and drink, such as: Eating more foods that contain vitamin B12. Drinking little or no alcohol. Getting vitamin B12 shots. Taking vitamin B12 supplements by mouth (orally). Your doctor will tell you the dose that is best for you. Follow  these instructions at home: Eating and drinking  Eat foods that come from animals and have a lot of vitamin B12 in them. These include: Meats and poultry. This includes beef, pork, chicken, turkey, and organ meats, such as liver. Seafood, such as clams, rainbow trout, salmon, tuna, and haddock. Eggs. Dairy foods such as milk, yogurt, and cheese. Eat breakfast cereals that have vitamin B12 added to them (are fortified). Check the label. The items listed above may not be a complete list of foods and beverages you can eat and drink. Contact a dietitian for more information. Alcohol use Do not drink alcohol if: Your doctor tells you not to drink. You are pregnant, may be pregnant, or are planning to become pregnant. If you drink alcohol: Limit how much you have to: 0-1 drink a day for women. 0-2 drinks a day for men. Know how much alcohol is in your drink. In the [place] one drink equals one 12 oz bottle of beer (355 mL), one 5 oz glass of wine (148 mL), or one 1 oz glass of hard liquor (44 mL). General instructions Get any vitamin B12 shots if told by your doctor. Take supplements only as told by your doctor. Follow the directions. Keep all follow-up visits. Contact a doctor if: Your symptoms come back. Your symptoms get worse or do not get better with treatment. Get help right away if: You have trouble breathing. You have a very fast heartbeat. You have chest pain. You get dizzy. You faint. These symptoms may be an emergency. Get help right away. Call 911.   Do not wait to see if the symptoms will go away. Do not drive yourself to the hospital. Summary Vitamin B12 deficiency means that your body is not getting enough of the vitamin. In some cases, there are no symptoms of this condition. Treatment may include making a change in the way you eat and drink, getting shots, or taking supplements. Eat foods that have vitamin B12 in them. This information is not intended to replace advice  given to you by your health care provider. Make sure you discuss any questions you have with your health care provider. Document Revised: 02/09/2021 Document Reviewed: 02/09/2021 Elsevier Patient Education  2022 [employer] Inc.  

## 2021-08-28 NOTE — Progress Notes (Signed)
Hematology and Oncology Follow Up Visit  Richard [name] [id number] 11/26/1940 81 y.o. 08/28/2021   Principle Diagnosis:  Diffuse large cell non-Hodgkin's lymphoma (IPI = 3) - NOT "double hit" Pernicious anemia Iron deficiency secondary to bleeding   Past Therapy: R-CHOP - s/p cycle 8 - completed 08/2017   Current Therapy:        Vitamin B12 1 mg IM every month Xgeva 120 mg subcu q 3 months - next dose due in 08/2021 IV iron as indicated    Interim History:  [name] is here today for follow-up and B 12 injection. Since we last saw him he had both his port a cath and pacemaker removed. They replaced his pacemaker with a leadless model and he has already had a noticeable reduction in the swelling in his left arm. He is quite happy with the results.  He has had some fatigue post procedure but states that his energy continues to improve.  He was told not to go golfing until he is cleared at his follow-up next week.  No blood loss noted. No bruising or petechiae.  No fever, chills, n/v, cough, rash, dizziness, SOB, chest pain, palpitations, abdominal pain or changes in bowel or bladder habits.  Neuropathy in his lower extremities is unchanged from baseline.  No falls or syncope.  He notes that his appetite is improving and he is staying well hydrated throughout the day. His weight is 249 lbs.   ECOG Performance Status: 1 - Symptomatic but completely ambulatory  Medications:  Allergies as of 08/28/2021       Reactions   Benazepril Swelling   angioedema; he is not a candidate for any angiotensin receptor blockers because of this significant allergic reaction. Because of a history of documented adverse serious drug reaction;Medi Alert bracelet  is recommended   Hctz [hydrochlorothiazide] Anaphylaxis, Swelling   Tongue and lip swelling   Benazepril Hcl Other (See Comments)   Aspirin Other (See Comments)   Gastritis, cant take 325 Mg aspirin    Lactose Intolerance (gi) Nausea And  Vomiting        Medication List        Accurate as of August 28, 2021  1:07 PM. If you have any questions, ask your nurse or doctor.          acetaminophen 325 MG tablet Commonly known as: TYLENOL Take 650 mg by mouth at bedtime as needed for moderate pain or headache.   allopurinol 300 MG tablet Commonly known as: ZYLOPRIM Take 450 mg by mouth daily.   aluminum hydroxide-magnesium carbonate 95-358 MG/15ML Susp Commonly known as: GAVISCON Take 15 mLs by mouth as needed for indigestion or heartburn.   amLODipine 5 MG tablet Commonly known as: NORVASC TAKE 1 TABLET BY MOUTH TWICE A DAY   apixaban 2.5 MG Tabs tablet Commonly known as: ELIQUIS Take 1 tablet (2.5 mg total) by mouth 2 (two) times daily.   atorvastatin 80 MG tablet Commonly known as: LIPITOR TAKE ONE TABLET BY MOUTH AT BEDTIME   azelastine 0.1 % nasal spray Commonly known as: ASTELIN PLACE 2 SPRAYS INTO BOTH NOSTRILS AT BEDTIME AS NEEDED FOR RHINITIS OR ALLERGIES.   dapagliflozin propanediol 10 MG Tabs tablet Commonly known as: FARXIGA Take 10 mg by mouth daily.   esomeprazole 40 MG capsule Commonly known as: NexIUM Take 1 capsule (40 mg total) by mouth daily.   esomeprazole 40 MG capsule Commonly known as: NEXIUM TAKE 1 CAPSULE (40 MG TOTAL) BY MOUTH DAILY AT 12  NOON.   eye wash Soln Place 1 drop into both eyes at bedtime. [facility] Soothing Eye Wash   ezetimibe 10 MG tablet Commonly known as: ZETIA TAKE 1 TABLET BY MOUTH EVERY DAY   fenofibrate 160 MG tablet TAKE 1 TABLET BY MOUTH EVERY DAY   folic acid 1 MG tablet Commonly known as: FOLVITE TAKE 2 TABLETS BY MOUTH EVERY DAY   freestyle lancets USE TWICE A DAY TO CHECK BLOOD SUGAR. DX E11.9   FREESTYLE LITE test strip Generic drug: glucose blood USE TO TEST BLOOD SUGAR ONCE A DAY. DX CODE: E11.9   glimepiride 1 MG tablet Commonly known as: AMARYL TAKE 1 TABLET BY MOUTH EVERY DAY WITH BREAKFAST   icosapent Ethyl 1 g  capsule Commonly known as: VASCEPA TAKE 2 CAPSULES BY MOUTH TWICE A DAY   ICY HOT ADVANCED PAIN RELIEF EX Apply 1 application topically daily. Roll-on   Klor-Con M20 20 MEQ tablet Generic drug: potassium chloride SA TAKE 2 TABLETS (40 MEQ TOTAL) BY MOUTH 2 (TWO) TIMES DAILY.   metFORMIN 1000 MG tablet Commonly known as: GLUCOPHAGE TAKE 1 & 1/2 TABLETS BY MOUTH EVERY MORNING AND 1 TABLET IN THE EVENING.   metoprolol tartrate 25 MG tablet Commonly known as: LOPRESSOR TAKE ONE-HALF TABLET BY MOUTH TWICE DAILY   nitroGLYCERIN 0.4 MG SL tablet Commonly known as: NITROSTAT PLACE 1 TABLET (0.4 MG TOTAL) UNDER THE TONGUE EVERY 5 (FIVE) MINUTES AS NEEDED FOR CHEST PAIN.   pregabalin 150 MG capsule Commonly known as: LYRICA Take 1 capsule (150 mg total) by mouth 2 (two) times daily.   psyllium 58.6 % powder Commonly known as: METAMUCIL Take 1 packet by mouth daily as needed (constipation).   torsemide 20 MG tablet Commonly known as: DEMADEX Take 20 mg by mouth daily.        Allergies:  Allergies  Allergen Reactions   Benazepril Swelling    angioedema; he is not a candidate for any angiotensin receptor blockers because of this significant allergic reaction. Because of a history of documented adverse serious drug reaction;Medi Alert bracelet  is recommended   Hctz [Hydrochlorothiazide] Anaphylaxis and Swelling    Tongue and lip swelling    Benazepril Hcl Other (See Comments)   Aspirin Other (See Comments)    Gastritis, cant take 325 Mg aspirin    Lactose Intolerance (Gi) Nausea And Vomiting    Past Medical History, Surgical history, Social history, and Family History were reviewed and updated.  Review of Systems: All other 10 point review of systems is negative.   Physical Exam:  vitals were not taken for this visit.   Wt Readings from Last 3 Encounters:  07/27/21 252 lb (114.3 kg)  07/26/21 253 lb (114.8 kg)  06/27/21 252 lb 0.6 oz (114.3 kg)    Ocular:  Sclerae unicteric, pupils equal, round and reactive to light Ear-nose-throat: Oropharynx clear, dentition fair Lymphatic: No cervical or supraclavicular adenopathy Lungs no rales or rhonchi, good excursion bilaterally Heart regular rate and rhythm, no murmur appreciated Abd soft, nontender, positive bowel sounds MSK no focal spinal tenderness, no joint edema Neuro: non-focal, well-oriented, appropriate affect Breasts: Deferred   Lab Results  Component Value Date   WBC 7.6 07/27/2021   HGB 12.7 (L) 07/27/2021   HCT 39.1 07/27/2021   MCV 89.3 07/27/2021   PLT 189 07/27/2021   Lab Results  Component Value Date   FERRITIN 1,435 (H) 07/27/2021   IRON 71 07/27/2021   TIBC 403 07/27/2021   UIBC 332 07/27/2021  IRONPCTSAT 18 07/27/2021   Lab Results  Component Value Date   RETICCTPCT 1.8 07/27/2021   RBC 4.44 07/27/2021   RETICCTABS 52.1 11/22/2011   No results found for: Nils Pyle [facility] Lab Results  Component Value Date   IGA 159 03/09/2012   Lab Results  Component Value Date   ALBUMINELP 4.2 07/27/2018   MSPIKE Not Observed 07/27/2018     Chemistry      Component Value Date/Time   NA 140 07/27/2021 1303   NA 136 03/16/2019 0938   NA 144 06/27/2017 0857   K 4.1 07/27/2021 1303   K 3.9 06/27/2017 0857   CL 103 07/27/2021 1303   CL 103 06/27/2017 0857   CO2 27 07/27/2021 1303   CO2 26 06/27/2017 0857   BUN 28 (H) 07/27/2021 1303   BUN 34 (H) 03/16/2019 0938   BUN 12 06/27/2017 0857   CREATININE 1.52 (H) 07/27/2021 1303   CREATININE 1.65 (H) 04/18/2020 0956      Component Value Date/Time   CALCIUM 10.4 (H) 07/27/2021 1303   CALCIUM 9.2 06/27/2017 0857   ALKPHOS 68 07/27/2021 1303   ALKPHOS 128 (H) 06/27/2017 0857   AST 15 07/27/2021 1303   ALT 12 07/27/2021 1303   ALT 24 06/27/2017 0857   BILITOT 0.5 07/27/2021 1303       Impression and Plan: Mr. Statz is a very pleasant 81 yo caucasian gentleman with diffuse large B-cell lymphoma  (not "double hit" lymphoma). He completed treatment in February 2019.  B 12 given.  Iron studies pending.  Follow-up in 1 month.   Lottie Dawson, NP 2/28/20231:07 PM

## 2021-08-29 LAB — FERRITIN: Ferritin: 1035 ng/mL — ABNORMAL HIGH (ref 24–336)

## 2021-09-06 ENCOUNTER — Ambulatory Visit (INDEPENDENT_AMBULATORY_CARE_PROVIDER_SITE_OTHER): Payer: Medicare Other

## 2021-09-06 DIAGNOSIS — Z0000 Encounter for general adult medical examination without abnormal findings: Secondary | ICD-10-CM

## 2021-09-06 NOTE — Patient Instructions (Signed)
Richard [name] , Thank you for taking time to come for your Medicare Wellness Visit. I appreciate your ongoing commitment to your health goals. Please review the following plan we discussed and let me know if I can assist you in the future.   Screening recommendations/referrals: Colonoscopy: no longer needed Recommended yearly ophthalmology/optometry visit for glaucoma screening and checkup Recommended yearly dental visit for hygiene and checkup  Vaccinations: Influenza vaccine: up to date Pneumococcal vaccine: up to date Tdap vaccine: up to date Shingles vaccine: up to date   Covid-19: completed per pt   Advanced directives: Yes, copy on file  Conditions/risks identified: see problem list  Next appointment: Follow up in one year for your annual wellness visit.   Preventive Care 40 Years and Older, Male Preventive care refers to lifestyle choices and visits with your health care provider that can promote health and wellness. What does preventive care include? A yearly physical exam. This is also called an annual well check. Dental exams once or twice a year. Routine eye exams. Ask your health care provider how often you should have your eyes checked. Personal lifestyle choices, including: Daily care of your teeth and gums. Regular physical activity. Eating a healthy diet. Avoiding tobacco and drug use. Limiting alcohol use. Practicing safe sex. Taking low doses of aspirin every day. Taking vitamin and mineral supplements as recommended by your health care provider. What happens during an annual well check? The services and screenings done by your health care provider during your annual well check will depend on your age, overall health, lifestyle risk factors, and family history of disease. Counseling  Your health care provider may ask you questions about your: Alcohol use. Tobacco use. Drug use. Emotional well-being. Home and relationship well-being. Sexual activity. Eating  habits. History of falls. Memory and ability to understand (cognition). Work and work Statistician. Screening  You may have the following tests or measurements: Height, weight, and BMI. Blood pressure. Lipid and cholesterol levels. These may be checked every 5 years, or more frequently if you are over 59 years old. Skin check. Lung cancer screening. You may have this screening every year starting at age 22 if you have a 30-pack-year history of smoking and currently smoke or have quit within the past 15 years. Fecal occult blood test (FOBT) of the stool. You may have this test every year starting at age 53. Flexible sigmoidoscopy or colonoscopy. You may have a sigmoidoscopy every 5 years or a colonoscopy every 10 years starting at age 28. Prostate cancer screening. Recommendations will vary depending on your family history and other risks. Hepatitis C blood test. Hepatitis B blood test. Sexually transmitted disease (STD) testing. Diabetes screening. This is done by checking your blood sugar (glucose) after you have not eaten for a while (fasting). You may have this done every 1-3 years. Abdominal aortic aneurysm (AAA) screening. You may need this if you are a current or former smoker. Osteoporosis. You may be screened starting at age 71 if you are at high risk. Talk with your health care provider about your test results, treatment options, and if necessary, the need for more tests. Vaccines  Your health care provider may recommend certain vaccines, such as: Influenza vaccine. This is recommended every year. Tetanus, diphtheria, and acellular pertussis (Tdap, Td) vaccine. You may need a Td booster every 10 years. Zoster vaccine. You may need this after age 20. Pneumococcal 13-valent conjugate (PCV13) vaccine. One dose is recommended after age 2. Pneumococcal polysaccharide (PPSV23) vaccine. One  dose is recommended after age 24. Talk to your health care provider about which screenings and  vaccines you need and how often you need them. This information is not intended to replace advice given to you by your health care provider. Make sure you discuss any questions you have with your health care provider. Document Released: 07/14/2015 Document Revised: 03/06/2016 Document Reviewed: 04/18/2015 Elsevier Interactive Patient Education  2017 [place] Prevention in the Home Falls can cause injuries. They can happen to people of all ages. There are many things you can do to make your home safe and to help prevent falls. What can I do on the outside of my home? Regularly fix the edges of walkways and driveways and fix any cracks. Remove anything that might make you trip as you walk through a door, such as a raised step or threshold. Trim any bushes or trees on the path to your home. Use bright outdoor lighting. Clear any walking paths of anything that might make someone trip, such as rocks or tools. Regularly check to see if handrails are loose or broken. Make sure that both sides of any steps have handrails. Any raised decks and porches should have guardrails on the edges. Have any leaves, snow, or ice cleared regularly. Use sand or salt on walking paths during winter. Clean up any spills in your garage right away. This includes oil or grease spills. What can I do in the bathroom? Use night lights. Install grab bars by the toilet and in the tub and shower. Do not use towel bars as grab bars. Use non-skid mats or decals in the tub or shower. If you need to sit down in the shower, use a plastic, non-slip stool. Keep the floor dry. Clean up any water that spills on the floor as soon as it happens. Remove soap buildup in the tub or shower regularly. Attach bath mats securely with double-sided non-slip rug tape. Do not have throw rugs and other things on the floor that can make you trip. What can I do in the bedroom? Use night lights. Make sure that you have a light by your  bed that is easy to reach. Do not use any sheets or blankets that are too big for your bed. They should not hang down onto the floor. Have a firm chair that has side arms. You can use this for support while you get dressed. Do not have throw rugs and other things on the floor that can make you trip. What can I do in the kitchen? Clean up any spills right away. Avoid walking on wet floors. Keep items that you use a lot in easy-to-reach places. If you need to reach something above you, use a strong step stool that has a grab bar. Keep electrical cords out of the way. Do not use floor polish or wax that makes floors slippery. If you must use wax, use non-skid floor wax. Do not have throw rugs and other things on the floor that can make you trip. What can I do with my stairs? Do not leave any items on the stairs. Make sure that there are handrails on both sides of the stairs and use them. Fix handrails that are broken or loose. Make sure that handrails are as long as the stairways. Check any carpeting to make sure that it is firmly attached to the stairs. Fix any carpet that is loose or worn. Avoid having throw rugs at the top or bottom of the  stairs. If you do have throw rugs, attach them to the floor with carpet tape. Make sure that you have a light switch at the top of the stairs and the bottom of the stairs. If you do not have them, ask someone to add them for you. What else can I do to help prevent falls? Wear shoes that: Do not have high heels. Have rubber bottoms. Are comfortable and fit you well. Are closed at the toe. Do not wear sandals. If you use a stepladder: Make sure that it is fully opened. Do not climb a closed stepladder. Make sure that both sides of the stepladder are locked into place. Ask someone to hold it for you, if possible. Clearly mark and make sure that you can see: Any grab bars or handrails. First and last steps. Where the edge of each step is. Use tools that  help you move around (mobility aids) if they are needed. These include: Canes. Walkers. Scooters. Crutches. Turn on the lights when you go into a dark area. Replace any light bulbs as soon as they burn out. Set up your furniture so you have a clear path. Avoid moving your furniture around. If any of your floors are uneven, fix them. If there are any pets around you, be aware of where they are. Review your medicines with your doctor. Some medicines can make you feel dizzy. This can increase your chance of falling. Ask your doctor what other things that you can do to help prevent falls. This information is not intended to replace advice given to you by your health care provider. Make sure you discuss any questions you have with your health care provider. Document Released: 04/13/2009 Document Revised: 11/23/2015 Document Reviewed: 07/22/2014 Elsevier Interactive Patient Education  2017 [employer].

## 2021-09-06 NOTE — Progress Notes (Unsigned)
Subjective:   Richard [name] is a 81 y.o. male who presents for Medicare Annual/Subsequent preventive examination.  I connected with  [name] on 09/06/21 by a audio enabled telemedicine application and verified that I am speaking with the correct person using two identifiers.  Patient Location: Home  Provider Location: Office/Clinic  I discussed the limitations of evaluation and management by telemedicine. The patient expressed understanding and agreed to proceed.   Review of Systems     Cardiac Risk Factors include: advanced age (>68mn, >>59women);diabetes mellitus;hypertension;dyslipidemia     Objective:    Today's Vitals   09/06/21 1307  PainSc: 0-No pain   There is no height or weight on file to calculate BMI.  Advanced Directives 09/06/2021 09/06/2021 08/28/2021 07/27/2021 06/27/2021 05/28/2021 04/25/2021  Does Patient Have a Medical Advance Directive? Yes Yes No No Yes Yes Yes  Type of AP[place]amed[place] of facility DNR (pink MOST or yellow form);Living will HH[place] of facility DNR (pink MOST or yellow form);Living will Living will;Healthcare Power of Attorney - Living will;Healthcare Power of ARonaldLiving will HMayettaLiving will  Does patient want to make changes to medical advance directive? - - - - No - Patient declined - No - Patient declined  Copy of HM[place]in Chart? Yes - validated most recent copy scanned in chart (See row information) Yes - validated most recent copy scanned in chart (See row information) No - copy requested - - No - copy requested No - copy requested  Would patient like information on creating a medical advance directive? - - No - Patient declined - - - No - Patient declined    Current Medications (verified) Outpatient Encounter Medications as of 09/06/2021  Medication Sig   acetaminophen (TYLENOL) 325 MG tablet  Take 650 mg by mouth at bedtime as needed for moderate pain or headache.   allopurinol (ZYLOPRIM) 300 MG tablet Take 450 mg by mouth daily.   aluminum hydroxide-magnesium carbonate (GAVISCON) 95-358 MG/15ML SUSP Take 15 mLs by mouth as needed for indigestion or heartburn.   amLODipine (NORVASC) 5 MG tablet TAKE 1 TABLET BY MOUTH TWICE A DAY   apixaban (ELIQUIS) 2.5 MG TABS tablet Take 1 tablet (2.5 mg total) by mouth 2 (two) times daily.   atorvastatin (LIPITOR) 80 MG tablet TAKE ONE TABLET BY MOUTH AT BEDTIME   azelastine (ASTELIN) 0.1 % nasal spray PLACE 2 SPRAYS INTO BOTH NOSTRILS AT BEDTIME AS NEEDED FOR RHINITIS OR ALLERGIES.   dapagliflozin propanediol (FARXIGA) 10 MG TABS tablet Take 10 mg by mouth daily.   esomeprazole (NEXIUM) 40 MG capsule Take 1 capsule (40 mg total) by mouth daily.   esomeprazole (NEXIUM) 40 MG capsule TAKE 1 CAPSULE (40 MG TOTAL) BY MOUTH DAILY AT 12 NOON.   eye wash (,SODIUM/POTASSIUM/SOD CHLORIDE,) SOLN Place 1 drop into both eyes at bedtime. [facility] Soothing Eye Wash   ezetimibe (ZETIA) 10 MG tablet TAKE 1 TABLET BY MOUTH EVERY DAY   fenofibrate 160 MG tablet TAKE 1 TABLET BY MOUTH EVERY DAY   folic acid (FOLVITE) 1 MG tablet TAKE 2 TABLETS BY MOUTH EVERY DAY   FREESTYLE LITE test strip USE TO TEST BLOOD SUGAR ONCE A DAY. DX CODE: E11.9   glimepiride (AMARYL) 1 MG tablet TAKE 1 TABLET BY MOUTH EVERY DAY WITH BREAKFAST   icosapent Ethyl (VASCEPA) 1 g capsule TAKE 2 CAPSULES BY MOUTH TWICE A DAY  KLOR-CON M20 20 MEQ tablet TAKE 2 TABLETS (40 MEQ TOTAL) BY MOUTH 2 (TWO) TIMES DAILY.   Lancets (FREESTYLE) lancets USE TWICE A DAY TO CHECK BLOOD SUGAR. DX E11.9   Menthol-Camphor (ICY HOT ADVANCED PAIN RELIEF EX) Apply 1 application topically daily. Roll-on   metFORMIN (GLUCOPHAGE) 1000 MG tablet TAKE 1 & 1/2 TABLETS BY MOUTH EVERY MORNING AND 1 TABLET IN THE EVENING.   metoprolol tartrate (LOPRESSOR) 25 MG tablet TAKE ONE-HALF TABLET BY MOUTH TWICE DAILY    nitroGLYCERIN (NITROSTAT) 0.4 MG SL tablet PLACE 1 TABLET (0.4 MG TOTAL) UNDER THE TONGUE EVERY 5 (FIVE) MINUTES AS NEEDED FOR CHEST PAIN.   pregabalin (LYRICA) 150 MG capsule Take 1 capsule (150 mg total) by mouth 2 (two) times daily.   psyllium (METAMUCIL) 58.6 % powder Take 1 packet by mouth daily as needed (constipation).   torsemide (DEMADEX) 20 MG tablet Take 20 mg by mouth daily.   Facility-Administered Encounter Medications as of 09/06/2021  Medication   sodium chloride flush (NS) 0.9 % injection 10 mL    Allergies (verified) Benazepril, Hctz [hydrochlorothiazide], Benazepril hcl, Aspirin, and Lactose intolerance (gi)   History: Past Medical History:  Diagnosis Date   Anemia    Arthritis    hips   Axillary adenopathy 02/25/2017   Bradycardia    a. holter monitor has demonstrated HRs in 30s and [place]ink[place]ach    CAD (coronary artery disease)    a. s/p CABG 2001  b.  07/28/2017 cath:   Severe three-vessel native CAD with total occlusion of LAD, ramus intermedius, first OM and RCA, patent RIMA to PDA, LIMA to LAD, sequential SVG to ramus intermedius and first OM.     Chronic lower back pain    Diffuse large B cell lymphoma (HCC)    Diverticulosis    Esophageal stricture    GERD (gastroesophageal reflux disease)    History of gout    HTN (hypertension)    Mixed hyperlipidemia    OSA on CPAP    with 2L O2 at night   Pancytopenia ([place])    a. related to chemo therapy for B cell lymphoma   Peptic stricture of esophagus    Presence of permanent cardiac pacemaker    sees Dr. Valaria Good pacemaker   SCC (squamous cell carcinoma) 01/31/2021   R zygoma, [facility]   SCC (squamous cell carcinoma) 01/31/2021   L post ankle, [facility]   SCC (squamous cell carcinoma) 01/31/2021   L popliteal, [facility]   Severe aortic stenosis    Spinal stenosis    Squamous cell carcinoma of skin 03/22/2009   Right mandible. SCCis, hypertrophic.    Type II diabetes mellitus ([place])    Wears dentures    partial  upper   Past Surgical History:  Procedure Laterality Date   APPENDECTOMY  ~ [place] Left 11/06/2016   Procedure: CATARACT EXTRACTION PHACO AND INTRAOCULAR LENS PLACEMENT (IOC);  Surgeon: Estill Cotta, MD;  Location: [facility];  Service: Ophthalmology;  Laterality: Left;  Lot # [id number] H [place]: 01:09.4 AP%:25.2 CDE: 30.64   CATARACT EXTRACTION W/PHACO Right 12/04/2016   Procedure: CATARACT EXTRACTION PHACO AND INTRAOCULAR LENS PLACEMENT (IOC);  Surgeon: Estill Cotta, MD;  Location: [facility];  Service: Ophthalmology;  Laterality: Right;  [place] 1:25.9 AP% 24.1 CDE 39.10 Fluid Pack lot # [id number]   COLONOSCOPY W/ BIOPSIES AND POLYPECTOMY  07/02/2011   CORONARY ANGIOPLASTY  07/02/1991   CORONARY ANGIOPLASTY WITH STENT PLACEMENT  05/31/1997   "  1"   CORONARY ARTERY BYPASS GRAFT  03/01/2000   "CABG X5"   ECTROPION REPAIR Right 09/01/2018   Procedure: REPAIR OF ECTROPION BILATERAL upper and lower;  Surgeon: Karle Starch, MD;  Location: [place];  Service: Ophthalmology;  Laterality: Right;  Diabetic - oral meds sleep apnea   ESOPHAGEAL DILATION  X 3-4   Dr. Lyla Son; "last one was in the 1990's"   ESOPHAGOGASTRODUODENOSCOPY     multiple   FLEXIBLE SIGMOIDOSCOPY     multiple   HYDRADENITIS EXCISION Left 02/25/2017   Procedure: EXCISION DEEP LEFT AXILLARY LYMPH NODE;  Surgeon: Fanny Skates, MD;  Location: [place];  Service: General;  Laterality: Left;   INTRAOPERATIVE TRANSTHORACIC ECHOCARDIOGRAM N/A 09/09/2017   Procedure: INTRAOPERATIVE TRANSTHORACIC ECHOCARDIOGRAM;  Surgeon: Burnell Blanks, MD;  Location: [place];  Service: Open Heart Surgery;  Laterality: N/A;   KNEE ARTHROSCOPY Left 07/01/2009   meniscus repair   LEFT HEART CATHETERIZATION WITH CORONARY/GRAFT ANGIOGRAM N/A 03/16/2014   Procedure: LEFT HEART CATHETERIZATION WITH Beatrix Fetters;  Surgeon: Burnell Blanks, MD;  Location: [facility] C[facility];   Service: Cardiovascular;  Laterality: N/A;   LUMBAR LAMINECTOMY/DECOMPRESSION MICRODISCECTOMY Right 06/17/2013   Procedure: LUMBAR LAMINECTOMY MICRODISCECTOMY L4-L5 RIGHT EXCISION OF SYNOVIAL CYST RIGHT   (1 LEVEL) RIGHT PARTIAL FACETECTOMY;  Surgeon: Tobi Bastos, MD;  Location: [facility];  Service: Orthopedics;  Laterality: Right;   MYELOGRAM  04/06/2013   lumbar, Dr Gladstone Lighter   ORBITAL LESION EXCISION Right 09/01/2018   Procedure: ORBITOTOMY WITHOUT BONE FLAP WITH REMOVAL OF LESION RIGHT;  Surgeon: Karle Starch, MD;  Location: [place];  Service: Ophthalmology;  Laterality: Right;   PACEMAKER IMPLANT N/A 09/10/2017   Procedure: PACEMAKER IMPLANT;  Surgeon: Deboraha Sprang, MD;  Location: [place] CV LAB;  Service: Cardiovascular;  Laterality: N/A;   PANENDOSCOPY     PERIPHERAL VASCULAR BALLOON ANGIOPLASTY Left 03/16/2021   Procedure: PERIPHERAL VASCULAR BALLOON ANGIOPLASTY;  Surgeon: [name], MD;  Location: [place] CV LAB;  Service: Cardiovascular;  Laterality: Left;  subclavian  vein   PORTACATH PLACEMENT N/A 03/06/2017   Procedure: INSERTION PORT-A-CATH AND ASPIRATE SEROMA LEFT AXILLA;  Surgeon: Fanny Skates, MD;  Location: [place];  Service: General;  Laterality: N/A;   RIGHT/LEFT HEART CATH AND CORONARY/GRAFT ANGIOGRAPHY N/A 07/28/2017   Procedure: RIGHT/LEFT HEART CATH AND CORONARY/GRAFT ANGIOGRAPHY;  Surgeon: Sherren Mocha, MD;  Location: [place] CV LAB;  Service: Cardiovascular;  Laterality: N/A;   SHOULDER SURGERY Right 08/30/2010   screws placed; "tendons tore off"   SKIN CANCER EXCISION Right    "neck"   TONSILLECTOMY  ~ [place] Left 06/29/2019   Procedure: TOTAL KNEE ARTHROPLASTY;  Surgeon: Paralee Cancel, MD;  [facility];  Service: Orthopedics;  Laterality: Left;  70 mins   TRANSCATHETER AORTIC VALVE REPLACEMENT, TRANSFEMORAL N/A 09/09/2017   Procedure: TRANSCATHETER AORTIC VALVE REPLACEMENT, TRANSFEMORAL;  Surgeon:  Burnell Blanks, MD;  Location: [place];  Service: Open Heart Surgery;  Laterality: N/A;   UPPER EXTREMITY VENOGRAPHY Left 02/15/2021   Procedure: CENTRAL VENO;  Surgeon: [name], MD;  Location: [place] CV LAB;  Service: Cardiovascular;  Laterality: Left;   UPPER EXTREMITY VENOGRAPHY N/A 03/16/2021   Procedure: UPPER EXTREMITY VENOGRAPHY;  Surgeon: [name], MD;  Location: [place] CV LAB;  Service: Cardiovascular;  Laterality: N/A;   UPPER GI ENDOSCOPY  07/02/2011   Gastritis; Dr Carlean Purl   VASECTOMY     wireless  pacemaker placed     Family History  Problem Relation Age of Onset   Stroke Father    Hypertension Father    Pancreatic cancer Mother    Diabetes Maternal Grandmother    Stroke Maternal Grandmother    Heart attack Paternal Grandmother    Colon cancer Neg Hx    Esophageal cancer Neg Hx    Rectal cancer Neg Hx    Stomach cancer Neg Hx    Ulcers Neg Hx    Social History   Socioeconomic History   Marital status: Divorced    Spouse name: Not on file   Number of children: 2   Years of education: college   Highest education level: Not on file  Occupational History    Employer: RETIRED  Tobacco Use   Smoking status: Never   Smokeless tobacco: Never  Vaping Use   Vaping Use: Never used  Substance and Sexual Activity   Alcohol use: No    Alcohol/week: 0.0 standard drinks    Comment: "last drink was in 2012"( 03/15/2014)   Drug use: No   Sexual activity: Not Currently  Other Topics Concern   Not on file  Social History Narrative   ** Merged History Encounter **       Divorced, lives with a roommate. 1 son one daughter 3-4 caffeinated beverages daily Right-handed. 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   Social Determinants of Health   Financial Resource Strain: Low Risk    Difficulty of Paying Living  Expenses: Not very hard  Food Insecurity: No Food Insecurity   Worried About Charity fundraiser in the Last Year: Never true   Ran Out of Food in the Last Year: Never true  Transportation Needs: No Transportation Needs   Lack of Transportation (Medical): No   Lack of Transportation (Non-Medical): No  Physical Activity: Sufficiently Active   Days of Exercise per Week: 5 days   Minutes of Exercise per Session: 30 min  Stress: Not on file  Social Connections: Moderately Isolated   Frequency of Communication with Friends and Family: More than three times a week   Frequency of Social Gatherings with Friends and Family: More than three times a week   Attends Religious Services: More than 4 times per year   Active Member of [employer] or Organizations: No   Attends Music therapist: Never   Marital Status: Divorced    Tobacco Counseling Counseling given: Not Answered   Clinical Intake:  Pre-visit preparation completed: Yes  Pain : No/denies pain Pain Score: 0-No pain     Nutritional Risks: None Diabetes: Yes CBG done?: No Did pt. bring in CBG monitor from home?: No  How often do you need to have someone help you when you read instructions, pamphlets, or other written materials from your doctor or pharmacy?: 1 - Never  Diabetic?Yes Nutrition Risk Assessment:  Has the patient had any N/V/D within the last 2 months?  No  Does the patient have any non-healing wounds?  No  Has the patient had any unintentional weight loss or weight gain?  No   Diabetes:  Is the patient diabetic?  No  If diabetic, was a CBG obtained today?  No  Did the patient bring in their glucometer from home?  No  How often do you monitor your CBG's? Once.   Financial Strains and  Diabetes Management:  Are you having any financial strains with the device, your supplies or your medication? No .  Does the patient want to be seen by Chronic Care Management for management of their diabetes?  No   Would the patient like to be referred to a Nutritionist or for Diabetic Management?  No   Diabetic Exams:  Diabetic Eye Exam: Completed 04/05/21 Diabetic Foot Exam: Completed 03/21/21    Interpreter Needed?: No  Information entered by :: [place] of Daily Living In your present state of health, do you have any difficulty performing the following activities: 09/06/2021  Hearing? N  Vision? N  Difficulty concentrating or making decisions? N  Walking or climbing stairs? N  Dressing or bathing? N  Doing errands, shopping? N  Preparing Food and eating ? N  Using the Toilet? N  In the past six months, have you accidently leaked urine? N  Do you have problems with loss of bowel control? N  Managing your Medications? N  Managing your Finances? N  Housekeeping or managing your Housekeeping? N  Some recent data might be hidden    Patient Care Team: Carollee Herter, Alferd Apa, DO as PCP - General (Family Medicine) Minus Breeding, MD as PCP - Cardiology (Cardiology) Deboraha Sprang, MD as PCP - Electrophysiology (Cardiology) Dingeldein, Remo Lipps, MD (Ophthalmology) Rigoberto Noel, MD as Consulting Physician (Pulmonary Disease) [name], MD as Consulting Physician (Oncology) Minus Breeding, MD as Consulting Physician (Cardiology) Parrett, Fonnie Mu, NP as Nurse Practitioner (Pulmonary Disease) [facility] (Dermatology) Latanya Maudlin, MD as Consulting Physician (Orthopedic Surgery) Kidney, Inez Pilgrim, [place] (Pharmacist) Murlean Iba, MD (Nephrology) [name], MD as Referring Physician (Radiology)  Indicate any recent Medical Services you may have received from other than [facility] providers in the past year (date may be approximate).     Assessment:   This is a routine wellness examination for Devansh.  Hearing/Vision screen No results found.  Dietary issues and exercise activities discussed: Current Exercise Habits:  Home exercise routine, Type of exercise: treadmill;walking;Other - see comments, Time (Minutes): 60, Frequency (Times/Week): 7, Weekly Exercise (Minutes/Week): 420, Intensity: Mild, Exercise limited by: None identified   Goals Addressed   None    Depression Screen PHQ 2/9 Scores 09/06/2021 07/26/2021 01/11/2020 11/22/2019 10/21/2018 10/20/2018 12/30/2017  PHQ - 2 Score 0 0 0 0 0 0 0    Fall Risk Fall Risk  09/06/2021 06/19/2021 01/11/2020 11/22/2019 11/15/2019  Falls in the past year? 0 0 '1 1 1  '$ Number falls in past yr: 0 0 0 0 0  Comment - - - - -  Injury with Fall? 0 0 0 1 1  Risk for fall due to : No Fall Risks History of fall(s);Impaired mobility - - Other (Comment)  Follow up Falls evaluation completed Falls evaluation completed - Education provided;Falls prevention discussed -    FALL RISK PREVENTION PERTAINING TO THE HOME:  Any stairs in or around the home? Yes  If so, are there any without handrails? Yes  Home free of loose throw rugs in walkways, pet beds, electrical cords, etc? Yes  Adequate lighting in your home to reduce risk of falls? Yes   ASSISTIVE DEVICES UTILIZED TO PREVENT FALLS:  Life alert? Yes  Use of a cane, walker or w/c? No  Grab bars in the bathroom? Yes  Shower chair or bench in shower? No  Elevated toilet seat or a handicapped toilet? No   TIMED UP AND  GO:  Was the test performed? No .   Cognitive Function: MMSE - Mini Mental State Exam 03/15/2016  Orientation to time 5  Orientation to Place 5  Registration 3  Attention/ Calculation 5  Recall 2  Language- name 2 objects 2  Language- repeat 1  Language- follow 3 step command 3  Language- read & follow direction 1  Write a sentence 1  Copy design 1  Total score 29     6CIT Screen 09/06/2021  What Year? 0 points  What month? 0 points  What time? 0 points  Count back from 20 0 points  Months in reverse 0 points  Repeat phrase 0 points  Total Score 0    Immunizations Immunization History   Administered Date(s) Administered   Fluad Quad(high Dose 65+) 03/21/2021   Influenza Split 06/12/2011, 05/31/2013   Influenza Whole 05/01/2012   Influenza, High Dose Seasonal PF 02/09/2015, 03/12/2017, 03/09/2018, 03/02/2020   Influenza-Unspecified 02/11/2014, 02/29/2016, 02/25/2019   Moderna Sars-Covid-2 Vaccination 08/30/2019, 09/30/2019, 04/21/2020   Pneumococcal Conjugate-13 09/18/2015   Pneumococcal Polysaccharide-23 06/20/2013   Td 03/20/2010, 03/16/2013, 09/29/2017   Zoster Recombinat (Shingrix) 11/21/2016, 09/25/2017    TDAP status: Up to date  Flu Vaccine status: Up to date  Pneumococcal vaccine status: Up to date  Covid-19 vaccine status: Completed vaccines per pt   Qualifies for Shingles Vaccine? Yes   Zostavax completed No   Shingrix Completed?: Yes  Screening Tests Health Maintenance  Topic Date Due   COVID-19 Vaccine (4 - Booster for Moderna series) 06/16/2020   HEMOGLOBIN A1C  01/23/2022   FOOT EXAM  03/21/2022   OPHTHALMOLOGY EXAM  04/05/2022   URINE MICROALBUMIN  07/26/2022   TETANUS/TDAP  09/30/2027   Pneumonia Vaccine 90+ Years old  Completed   INFLUENZA VACCINE  Completed   Zoster Vaccines- Shingrix  Completed   HPV VACCINES  Aged Out   COLONOSCOPY (Pts 45-80yr Insurance coverage will need to be confirmed)  Discontinued    Health Maintenance  Health Maintenance Due  Topic Date Due   COVID-19 Vaccine (4 - Booster for Moderna series) 06/16/2020    Colorectal cancer screening: No longer required.   Lung Cancer Screening: (Low Dose CT Chest recommended if Age 83-80years, 30 pack-year currently smoking OR have quit w/in 15years.) does not qualify.   Lung Cancer Screening Referral: N/A  Additional Screening:  Hepatitis C Screening: does not qualify; Completed Aged out   Vision Screening: Recommended annual ophthalmology exams for early detection of glaucoma and other disorders of the eye. Is the patient up to date with their annual eye  exam?  Yes  Who is the provider or what is the name of the office in which the patient attends annual eye exams? [facility] pt is not established with a provider, would they like to be referred to a provider to establish care? No .   Dental Screening: Recommended annual dental exams for proper oral hygiene  Community Resource Referral / Chronic Care Management: CRR required this visit?  No   CCM required this visit?  No      Plan:     I have personally reviewed and noted the following in the patients chart:   Medical and social history Use of alcohol, tobacco or illicit drugs  Current medications and supplements including opioid prescriptions. Patient is not currently taking opioid prescriptions. Functional ability and status Nutritional status Physical activity Advanced directives List of other physicians Hospitalizations, surgeries, and ER visits in previous 12 months  Vitals Screenings to include cognitive, depression, and falls Referrals and appointments  In addition, I have reviewed and discussed with patient certain preventive protocols, quality metrics, and best practice recommendations. A written personalized care plan for preventive services as well as general preventive health recommendations were provided to patient.   Due to this being a telephonic visit, the after visit summary with patients personalized plan was offered to patient via mail or my-chart.  Patient would like per request, patient was mailed a copy of AVS.   Duard Brady Alex Mcmanigal, [place]   09/06/2021   Nurse Notes: None

## 2021-09-14 ENCOUNTER — Telehealth (INDEPENDENT_AMBULATORY_CARE_PROVIDER_SITE_OTHER): Payer: Medicare Other | Admitting: Family Medicine

## 2021-09-14 ENCOUNTER — Encounter: Payer: Self-pay | Admitting: Family Medicine

## 2021-09-14 DIAGNOSIS — U071 COVID-19: Secondary | ICD-10-CM

## 2021-09-14 DIAGNOSIS — J029 Acute pharyngitis, unspecified: Secondary | ICD-10-CM | POA: Diagnosis not present

## 2021-09-14 MED ORDER — PREDNISONE 10 MG PO TABS: ORAL_TABLET | ORAL | 0 refills | Status: DC

## 2021-09-14 MED ORDER — MOLNUPIRAVIR EUA 200MG CAPSULE: 4.0000 | ORAL_CAPSULE | Freq: Two times a day (BID) | ORAL | 0 refills | Status: AC

## 2021-09-14 NOTE — Patient Instructions (Signed)
COVID-19: Quarantine and Isolation ?Quarantine ?If you were exposed ?Quarantine and stay away from others when you have been in close contact with someone who has COVID-19. ?Isolate ?If you are sick or test positive ?Isolate when you are sick or when you have COVID-19, even if you don't have symptoms. ?When to stay home ?Calculating quarantine ?The date of your exposure is considered day 0. Day 1 is the first full day after your last contact with a person who has had COVID-19. Stay home and away from other people for at least 5 days. Learn why CDC updated guidance for the general public. ?IF YOU were exposed to COVID-19 and are NOT  ?up to dateIF YOU were exposed to COVID-19 and are NOT on COVID-19 vaccinations ?Quarantine for at least 5 days ?Stay home ?Stay home and quarantine for at least 5 full days. ?Wear a well-fitting mask if you must be around others in your home. ?Do not travel. ?Get tested ?Even if you don't develop symptoms, get tested at least 5 days after you last had close contact with someone with COVID-19. ?After quarantine ?Watch for symptoms ?Watch for symptoms until 10 days after you last had close contact with someone with COVID-19. ?Avoid travel ?It is best to avoid travel until a full 10 days after you last had close contact with someone with COVID-19. ?If you develop symptoms ?Isolate immediately and get tested. Continue to stay home until you know the results. Wear a well-fitting mask around others. ?Take precautions until day 10 ?Wear a well-fitting mask ?Wear a well-fitting mask for 10 full days any time you are around others inside your home or in public. Do not go to places where you are unable to wear a well-fitting mask. ?If you must travel during days 6-10, take precautions. ?Avoid being around people who are more likely to get very sick from COVID-19. ?IF YOU were exposed to COVID-19 and are  ?up to dateIF YOU were exposed to COVID-19 and are on COVID-19 vaccinations ?No  quarantine ?You do not need to stay home unless you develop symptoms. ?Get tested ?Even if you don't develop symptoms, get tested at least 5 days after you last had close contact with someone with COVID-19. ?Watch for symptoms ?Watch for symptoms until 10 days after you last had close contact with someone with COVID-19. ?If you develop symptoms ?Isolate immediately and get tested. Continue to stay home until you know the results. Wear a well-fitting mask around others. ?Take precautions until day 10 ?Wear a well-fitting mask ?Wear a well-fitting mask for 10 full days any time you are around others inside your home or in public. Do not go to places where you are unable to wear a well-fitting mask. ?Take precautions if traveling ?Avoid being around people who are more likely to get very sick from COVID-19. ?IF YOU were exposed to COVID-19 and had confirmed COVID-19 within the past 90 days (you tested positive using a viral test) ?No quarantine ?You do not need to stay home unless you develop symptoms. ?Watch for symptoms ?Watch for symptoms until 10 days after you last had close contact with someone with COVID-19. ?If you develop symptoms ?Isolate immediately and get tested. Continue to stay home until you know the results. Wear a well-fitting mask around others. ?Take precautions until day 10 ?Wear a well-fitting mask ?Wear a well-fitting mask for 10 full days any time you are around others inside your home or in public. Do not go to places where you are  unable to wear a well-fitting mask. ?Take precautions if traveling ?Avoid being around people who are more likely to get very sick from COVID-19. ?Calculating isolation ?Day 0 is your first day of symptoms or a positive viral test. Day 1 is the first full day after your symptoms developed or your test specimen was collected. If you have COVID-19 or have symptoms, isolate for at least 5 days. ?IF YOU tested positive for COVID-19 or have symptoms, regardless of  vaccination status ?Stay home for at least 5 days ?Stay home for 5 days and isolate from others in your home. ?Wear a well-fitting mask if you must be around others in your home. ?Do not travel. ?Ending isolation if you had symptoms ?End isolation after 5 full days if you are fever-free for 24 hours (without the use of fever-reducing medication) and your symptoms are improving. ?Ending isolation if you did NOT have symptoms ?End isolation after at least 5 full days after your positive test. ?If you got very sick from COVID-19 or have a weakened immune system ?You should isolate for at least 10 days. Consult your doctor before ending isolation. ?Take precautions until day 10 ?Wear a well-fitting mask ?Wear a well-fitting mask for 10 full days any time you are around others inside your home or in public. Do not go to places where you are unable to wear a well-fitting mask. ?Do not travel ?Do not travel until a full 10 days after your symptoms started or the date your positive test was taken if you had no symptoms. ?Avoid being around people who are more likely to get very sick from COVID-19. ?Definitions ?Exposure ?Contact with someone infected with SARS-CoV-2, the virus that causes COVID-19, in a way that increases the likelihood of getting infected with the virus. ?Close contact ?A close contact is someone who was less than 6 feet away from an infected person (laboratory-confirmed or a clinical diagnosis) for a cumulative total of 15 minutes or more over a 24-hour period. For example, three individual 5-minute exposures for a total of 15 minutes. People who are exposed to someone with COVID-19 after they completed at least 5 days of isolation are not considered close contacts. ?Quarantine ?Richard [name] is a strategy used to prevent transmission of COVID-19 by keeping people who have been in close contact with someone with COVID-19 apart from others. ?Who does not need to quarantine? ?If you had close contact with  someone with COVID-19 and you are in one of the following groups, you do not need to quarantine. ?You are up to date with your COVID-19 vaccines. ?You had confirmed COVID-19 within the last 90 days (meaning you tested positive using a viral test). ?If you are up to date with COVID-19 vaccines, you should wear a well-fitting mask around others for 10 days from the date of your last close contact with someone with COVID-19 (the date of last close contact is considered day 0). Get tested at least 5 days after you last had close contact with someone with COVID-19. If you test positive or develop COVID-19 symptoms, isolate from other people and follow recommendations in the Isolation section below. If you tested positive for COVID-19 with a viral test within the previous 90 days and subsequently recovered and remain without COVID-19 symptoms, you do not need to quarantine or get tested after close contact. You should wear a well-fitting mask around others for 10 days from the date of your last close contact with someone with COVID-19 (the date of last  close contact is considered day 0). If you have COVID-19 symptoms, get tested and isolate from other people and follow recommendations in the Isolation section below. ?Who should quarantine? ?If you come into close contact with someone with COVID-19, you should quarantine if you are not up to date on COVID-19 vaccines. This includes people who are not vaccinated. ?What to do for quarantine ?Stay home and away from other people for at least 5 days (day 0 through day 5) after your last contact with a person who has COVID-19. The date of your exposure is considered day 0. Wear a well-fitting mask when around others at home, if possible. ?For 10 days after your last close contact with someone with COVID-19, watch for fever (100.4?F or greater), cough, shortness of breath, or other COVID-19 symptoms. ?If you develop symptoms, get tested immediately and isolate until you receive  your test results. If you test positive, follow isolation recommendations. ?If you do not develop symptoms, get tested at least 5 days after you last had close contact with someone with COVID-19. ?If you test negative, you c

## 2021-09-14 NOTE — Progress Notes (Signed)
? ? ?MyChart Video Visit ? ? ? ?Virtual Visit via Video Note  ? ?This visit type was conducted due to national recommendations for restrictions regarding the COVID-19 Pandemic (e.g. social distancing) in an effort to limit this patient's exposure and mitigate transmission in our community. This patient is at least at moderate risk for complications without adequate follow up. This format is felt to be most appropriate for this patient at this time. Physical exam was limited by quality of the video and audio technology used for the visit. Richard [name]. was able to get the patient set up on a video visit. ? ?Patient location: Home Patient and provider in visit ?Provider location: Office ? ?I discussed the limitations of evaluation and management by telemedicine and the availability of in person appointments. The patient expressed understanding and agreed to proceed. ? ?Visit Date: 09/14/2021 ? ?Today's healthcare provider: [name], DO  ? ? ?Subjective:  ? ? Patient ID: Richard [name], male    DOB: 03/03/1941, 81 y.o.   MRN: [id number] ? ?Chief Complaint  ?Patient presents with  ? Covid Positive  ? ? ?HPI ?Patient is in today for a video visit. ? ?He tested positive for Covid-19 on 03/16. He was in the hospital 2 weeks ago. ? ?Symptoms started yesterday morning with rhinorrhea, sore throat, chills, trouble swallowing, loss of appetite and a little non-productive cough. He was not able to sleep much last night. He used mucinex and azelastine last night and it provided little relief. ? ?Past Medical History:  ?Diagnosis Date  ? Anemia   ? Arthritis   ? hips  ? Axillary adenopathy 02/25/2017  ? Bradycardia   ? a. holter monitor has demonstrated HRs in 30s and [place]   ? CAD (coronary artery disease)   ? a. s/p CABG 2001  b.  07/28/2017 cath:   Severe three-vessel native CAD with total occlusion of LAD, ramus intermedius, first OM and RCA, patent RIMA to PDA, LIMA to LAD, sequential SVG to ramus intermedius and  first OM.    ? Chronic lower back pain   ? Diffuse large B cell lymphoma ([place])   ? Diverticulosis   ? Esophageal stricture   ? GERD (gastroesophageal reflux disease)   ? History of gout   ? HTN (hypertension)   ? Mixed hyperlipidemia   ? OSA on CPAP   ? with 2L O2 at night  ? Pancytopenia ([place])   ? a. related to chemo therapy for B cell lymphoma  ? Peptic stricture of esophagus   ? Presence of permanent cardiac pacemaker   ? sees Dr. Valaria Good pacemaker  ? SCC (squamous cell carcinoma) 01/31/2021  ? R zygoma, [facility]  ? SCC (squamous cell carcinoma) 01/31/2021  ? L post ankle, [facility]  ? SCC (squamous cell carcinoma) 01/31/2021  ? L popliteal, [facility]  ? Severe aortic stenosis   ? Spinal stenosis   ? Squamous cell carcinoma of skin 03/22/2009  ? Right mandible. SCCis, hypertrophic.   ? Type II diabetes mellitus ([place])   ? Wears dentures   ? partial upper  ? ? ?Past Surgical History:  ?Procedure Laterality Date  ? APPENDECTOMY  ~ 1952  ? BACK SURGERY    ? CATARACT EXTRACTION W/PHACO Left 11/06/2016  ? Procedure: CATARACT EXTRACTION PHACO AND INTRAOCULAR LENS PLACEMENT (IOC);  Surgeon: Estill Cotta, MD;  Location: [facility];  Service: Ophthalmology;  Laterality: Left;  Lot # [id number] ?[place]: 01:09.4 ?AP%:25.2 ?CDE: 30.64  ? CATARACT EXTRACTION W/PHACO  Right 12/04/2016  ? Procedure: CATARACT EXTRACTION PHACO AND INTRAOCULAR LENS PLACEMENT (IOC);  Surgeon: Estill Cotta, MD;  Location: [facility];  Service: Ophthalmology;  Laterality: Right;  [place] 1:25.9 ?AP% 24.1 ?CDE 39.10 ?Fluid Pack lot # [id number]  ? COLONOSCOPY W/ BIOPSIES AND POLYPECTOMY  07/02/2011  ? CORONARY ANGIOPLASTY  07/02/1991  ? CORONARY ANGIOPLASTY WITH STENT PLACEMENT  05/31/1997  ? "1"  ? CORONARY ARTERY BYPASS GRAFT  03/01/2000  ? "CABG X5"  ? ECTROPION REPAIR Right 09/01/2018  ? Procedure: REPAIR OF ECTROPION BILATERAL upper and lower;  Surgeon: Karle Starch, MD;  Location: [place];  Service: Ophthalmology;  Laterality: Right;  Diabetic -  oral meds ?sleep apnea  ? ESOPHAGEAL DILATION  X 3-4  ? Dr. Lyla Son; "last one was in the 1990's"  ? ESOPHAGOGASTRODUODENOSCOPY    ? multiple  ? FLEXIBLE SIGMOIDOSCOPY    ? multiple  ? HYDRADENITIS EXCISION Left 02/25/2017  ? Procedure: EXCISION DEEP LEFT AXILLARY LYMPH NODE;  Surgeon: Fanny Skates, MD;  Location: [place];  Service: General;  Laterality: Left;  ? INTRAOPERATIVE TRANSTHORACIC ECHOCARDIOGRAM N/A 09/09/2017  ? Procedure: INTRAOPERATIVE TRANSTHORACIC ECHOCARDIOGRAM;  Surgeon: Burnell Blanks, MD;  Location: [place];  Service: Open Heart Surgery;  Laterality: N/A;  ? KNEE ARTHROSCOPY Left 07/01/2009  ? meniscus repair  ? LEFT HEART CATHETERIZATION WITH CORONARY/GRAFT ANGIOGRAM N/A 03/16/2014  ? Procedure: LEFT HEART CATHETERIZATION WITH Beatrix Fetters;  Surgeon: Burnell Blanks, MD;  Location: [facility];  Service: Cardiovascular;  Laterality: N/A;  ? LUMBAR LAMINECTOMY/DECOMPRESSION MICRODISCECTOMY Right 06/17/2013  ? Procedure: LUMBAR LAMINECTOMY MICRODISCECTOMY L4-L5 RIGHT EXCISION OF SYNOVIAL CYST RIGHT   (1 LEVEL) RIGHT PARTIAL FACETECTOMY;  Surgeon: Tobi Bastos, MD;  Location: [facility];  Service: Orthopedics;  Laterality: Right;  ? MYELOGRAM  04/06/2013  ? lumbar, Dr Gladstone Lighter  ? ORBITAL LESION EXCISION Right 09/01/2018  ? Procedure: ORBITOTOMY WITHOUT BONE FLAP WITH REMOVAL OF LESION RIGHT;  Surgeon: Karle Starch, MD;  Location: [place];  Service: Ophthalmology;  Laterality: Right;  ? PACEMAKER IMPLANT N/A 09/10/2017  ? Procedure: PACEMAKER IMPLANT;  Surgeon: Deboraha Sprang, MD;  Location: [place] [facility];  Service: Cardiovascular;  Laterality: N/A;  ? PANENDOSCOPY    ? PERIPHERAL VASCULAR BALLOON ANGIOPLASTY Left 03/16/2021  ? Procedure: PERIPHERAL VASCULAR BALLOON ANGIOPLASTY;  Surgeon: [name], MD;  Location: [place] [facility];  Service: Cardiovascular;  Laterality: Left;  subclavian  vein  ? PORTACATH PLACEMENT N/A 03/06/2017  ?  Procedure: INSERTION PORT-A-CATH AND ASPIRATE SEROMA LEFT AXILLA;  Surgeon: Fanny Skates, MD;  Location: [place];  Service: General;  Laterality: N/A;  ? RIGHT/LEFT HEART CATH AND CORONARY/GRAFT ANGIOGRAPHY N/A 07/28/2017  ? Procedure: RIGHT/LEFT HEART CATH AND CORONARY/GRAFT ANGIOGRAPHY;  Surgeon: Sherren Mocha, MD;  Location: [place] [facility];  Service: Cardiovascular;  Laterality: N/A;  ? SHOULDER SURGERY Right 08/30/2010  ? screws placed; "tendons tore off"  ? SKIN CANCER EXCISION Right   ? "neck"  ? TONSILLECTOMY  ~ 1954  ? TOTAL KNEE ARTHROPLASTY Left 06/29/2019  ? Procedure: TOTAL KNEE ARTHROPLASTY;  Surgeon: Paralee Cancel, MD;  Location: [facility];  Service: Orthopedics;  Laterality: Left;  70 mins  ? TRANSCATHETER AORTIC VALVE REPLACEMENT, TRANSFEMORAL N/A 09/09/2017  ? Procedure: TRANSCATHETER AORTIC VALVE REPLACEMENT, TRANSFEMORAL;  Surgeon: Burnell Blanks, MD;  Location: [place];  Service: Open Heart Surgery;  Laterality: N/A;  ? UPPER EXTREMITY VENOGRAPHY Left 02/15/2021  ? Procedure: CENTRAL VENO;  Surgeon: Stanford Breed,  [name], MD;  Location: [place] [facility];  Service: Cardiovascular;  Laterality: Left;  ? UPPER EXTREMITY VENOGRAPHY N/A 03/16/2021  ? Procedure: UPPER EXTREMITY VENOGRAPHY;  Surgeon: [name], MD;  Location: [place] [facility];  Service: Cardiovascular;  Laterality: N/A;  ? UPPER GI ENDOSCOPY  07/02/2011  ? Gastritis; Dr Carlean Purl  ? VASECTOMY    ? wireless pacemaker placed    ? ? ?Family History  ?Problem Relation Age of Onset  ? Stroke Father   ? Hypertension Father   ? Pancreatic cancer Mother   ? Diabetes Maternal Grandmother   ? Stroke Maternal Grandmother   ? Heart attack Paternal Grandmother   ? Colon cancer Neg Hx   ? Esophageal cancer Neg Hx   ? Rectal cancer Neg Hx   ? Stomach cancer Neg Hx   ? Ulcers Neg Hx   ? ? ?Social History  ? ?Socioeconomic History  ? Marital status: Divorced  ?  Spouse name: Not on file  ? Number of children: 2  ? Years of education:  college  ? Highest education level: Not on file  ?Occupational History  ?  Employer: RETIRED  ?Tobacco Use  ? Smoking status: Never  ? Smokeless tobacco: Never  ?Vaping Use  ? Vaping Use: Never used  ?Substance an

## 2021-09-18 DIAGNOSIS — Z48812 Encounter for surgical aftercare following surgery on the circulatory system: Secondary | ICD-10-CM | POA: Diagnosis not present

## 2021-09-18 DIAGNOSIS — Z950 Presence of cardiac pacemaker: Secondary | ICD-10-CM | POA: Diagnosis not present

## 2021-09-18 DIAGNOSIS — R600 Localized edema: Secondary | ICD-10-CM | POA: Diagnosis not present

## 2021-09-25 ENCOUNTER — Encounter: Payer: Self-pay | Admitting: Family

## 2021-09-25 ENCOUNTER — Other Ambulatory Visit: Payer: Self-pay

## 2021-09-25 ENCOUNTER — Telehealth: Payer: Self-pay | Admitting: *Deleted

## 2021-09-25 ENCOUNTER — Inpatient Hospital Stay (HOSPITAL_BASED_OUTPATIENT_CLINIC_OR_DEPARTMENT_OTHER): Payer: Medicare Other | Admitting: Family

## 2021-09-25 ENCOUNTER — Inpatient Hospital Stay: Payer: Medicare Other

## 2021-09-25 ENCOUNTER — Inpatient Hospital Stay: Payer: Medicare Other | Attending: Hematology & Oncology

## 2021-09-25 ENCOUNTER — Ambulatory Visit (INDEPENDENT_AMBULATORY_CARE_PROVIDER_SITE_OTHER): Payer: Medicare Other | Admitting: Pharmacist

## 2021-09-25 VITALS — BP 128/72 | HR 83 | Temp 97.0°F | Resp 17

## 2021-09-25 DIAGNOSIS — D519 Vitamin B12 deficiency anemia, unspecified: Secondary | ICD-10-CM

## 2021-09-25 DIAGNOSIS — C8332 Diffuse large B-cell lymphoma, intrathoracic lymph nodes: Secondary | ICD-10-CM | POA: Diagnosis not present

## 2021-09-25 DIAGNOSIS — D510 Vitamin B12 deficiency anemia due to intrinsic factor deficiency: Secondary | ICD-10-CM | POA: Insufficient documentation

## 2021-09-25 DIAGNOSIS — E1169 Type 2 diabetes mellitus with other specified complication: Secondary | ICD-10-CM

## 2021-09-25 DIAGNOSIS — D500 Iron deficiency anemia secondary to blood loss (chronic): Secondary | ICD-10-CM

## 2021-09-25 DIAGNOSIS — D508 Other iron deficiency anemias: Secondary | ICD-10-CM

## 2021-09-25 DIAGNOSIS — I25118 Atherosclerotic heart disease of native coronary artery with other forms of angina pectoris: Secondary | ICD-10-CM

## 2021-09-25 DIAGNOSIS — N183 Hypertensive chronic kidney disease with stage 1 through stage 4 chronic kidney disease, or unspecified chronic kidney disease: Secondary | ICD-10-CM

## 2021-09-25 DIAGNOSIS — I10 Essential (primary) hypertension: Secondary | ICD-10-CM

## 2021-09-25 DIAGNOSIS — I25810 Atherosclerosis of coronary artery bypass graft(s) without angina pectoris: Secondary | ICD-10-CM

## 2021-09-25 LAB — LACTATE DEHYDROGENASE: LDH: 151 U/L (ref 98–192)

## 2021-09-25 LAB — CBC WITH DIFFERENTIAL (CANCER CENTER ONLY)
Abs Immature Granulocytes: 0.2 10*3/uL — ABNORMAL HIGH (ref 0.00–0.07)
Basophils Absolute: 0 10*3/uL (ref 0.0–0.1)
Basophils Relative: 0 %
Eosinophils Absolute: 0.2 10*3/uL (ref 0.0–0.5)
Eosinophils Relative: 2 %
HCT: 39 % (ref 39.0–52.0)
Hemoglobin: 12.3 g/dL — ABNORMAL LOW (ref 13.0–17.0)
Immature Granulocytes: 2 %
Lymphocytes Relative: 17 %
Lymphs Abs: 1.6 10*3/uL (ref 0.7–4.0)
MCH: 28.3 pg (ref 26.0–34.0)
MCHC: 31.5 g/dL (ref 30.0–36.0)
MCV: 89.7 fL (ref 80.0–100.0)
Monocytes Absolute: 0.9 10*3/uL (ref 0.1–1.0)
Monocytes Relative: 9 %
Neutro Abs: 6.9 10*3/uL (ref 1.7–7.7)
Neutrophils Relative %: 70 %
Platelet Count: 169 10*3/uL (ref 150–400)
RBC: 4.35 MIL/uL (ref 4.22–5.81)
RDW: 16.8 % — ABNORMAL HIGH (ref 11.5–15.5)
WBC Count: 9.8 10*3/uL (ref 4.0–10.5)
nRBC: 0 % (ref 0.0–0.2)

## 2021-09-25 LAB — RETICULOCYTES
Immature Retic Fract: 24.4 % — ABNORMAL HIGH (ref 2.3–15.9)
RBC.: 4.36 MIL/uL (ref 4.22–5.81)
Retic Count, Absolute: 94.6 10*3/uL (ref 19.0–186.0)
Retic Ct Pct: 2.2 % (ref 0.4–3.1)

## 2021-09-25 LAB — VITAMIN B12: Vitamin B-12: 120 pg/mL — ABNORMAL LOW (ref 180–914)

## 2021-09-25 MED ORDER — CYANOCOBALAMIN 1000 MCG/ML IJ SOLN
1000.0000 ug | Freq: Once | INTRAMUSCULAR | Status: AC
  Administered 2021-09-25: 1000 ug via INTRAMUSCULAR
  Filled 2021-09-25: qty 1

## 2021-09-25 NOTE — Chronic Care Management (AMB) (Signed)
? ? ?Chronic Care Management ?Pharmacy Note ? ?09/28/2021 ?Name:  Richard [name] MRN:  [id number] DOB:  March 13, 1941 ? ?Summary: ?Blood glucose well controlled but triglycerides elevated. Home blood glucose has been 85 to 180 with none < 85. Last A1c was 6.3%. Could consider de-escalation of metformin since Scr declining. Recommended dosing for metformin suggests if eGFR falls below 45 - monitor and weight risk versus benefits of full dose. Will consult with PCP about possibly lowering metformin to 570m bid. (Current dose is 15052mqam and 100027mpm)    ?We have assessed for patient assistance program in past but patient did not meet income limit. Usually hits Medicare coverage gap in May.  ?Subjective: ?FraJUSTAN [name] an 81 30o. year old male who is a primary patient of LowAnn HeldO.  The CCM team was consulted for assistance with disease management and care coordination needs.   ? ?Engaged with patient by telephone for follow up visit in response to provider referral for pharmacy case management and/or care coordination services.  ? ?Consent to Services:  ?The patient was given information about Chronic Care Management services, agreed to services, and gave verbal consent prior to initiation of services.  Please see initial visit note for detailed documentation.  ? ?Patient Care Team: ?LowCarollee HertervoAlferd ApaO as PCP - General (Family Medicine) ?HocMinus BreedingD as PCP - Cardiology (Cardiology) ?KleDeboraha SprangD as PCP - Electrophysiology (Cardiology) ?DinEstill CottaD (Ophthalmology) ?AlvRigoberto NoelD as Consulting Physician (Pulmonary Disease) ?EnnVolanda NapoleonD as Consulting Physician (Oncology) ?HocMinus BreedingD as Consulting Physician (Cardiology) ?Parrett, TamFonnie MuP as Nurse Practitioner (Pulmonary Disease) ?[facility], [facility] (Dermatology) ?GioLatanya MaudlinD as Consulting Physician (Orthopedic Surgery) ?Kidney, CarKentuckyckCherre RobinsPH-CPP (Pharmacist) ?SinMurlean IbaD (Nephrology) ?MilNevada CraneD as Referring Physician (Radiology) ? ?Recent office visits: ?09/14/2021 - Fam Med (Dr LowEtter SjogrenCheri Rousideo Visit - COVID. Prescribed molnupiravir and prednisone. ?07/26/2021 - Fam Med (Dr LowCarollee Herter/U chronic conditions. Labs checked. TG noted to be elevated. No medication changes noted.  ? ? ?Recent consult visits: ?09/18/2021 - [facility]U post balloon dilatation of the left subclavian vein following removal of pacemaker leads. F/U 1 month. ?07/27/2021 - Hematology (CaEulas PostP) F/U anemia due to B12 deficiency and large B cell lymphoma. B12 injection given. Labs checked.  ?07/10/2021 - Radiology (Dr MilSabra Heck/U left upper extremity swelling. Plan for port removal and bilateral upper extremity venogram under moderate sedation.Will hold Eliquis for 48 hours prior to the procedure ?06/27/2021 - Hematology (CaEulas PostP) F/U anemia due to B12 deficiency and large B cell lymphoma. GIven B12 and xgeva. Labs checked. F/U 1 month. ?06/26/2021 - Radiology ([facility]ab[facility] for left arm swelling following left sided pacemaker placement. Recommended he continue Eliquis and wear compression sleeve. Will discuss case w/Dr. KleCaryl Comesd WhaRemus Blaker Pacemaker revision and right subclavian port removed prior to Pacemaker placement.  ?05/01/2021 - Cardio (Dr KleCaryl Comes/u afib, pacemaker and subclavian vein stenosis. Venoplast not successful. Consider other option. [facility] ?03/13/2021 - Vasc Surgery (HaStanford BreedD) Seen for stenosis of left subclavian vein. Left subcalvian venoplasty scheduled for 03/16/2021 ?03/08/2021 - [facility] ([facility] follow up. Considering angioplasty to severe 85% subclavian vein stenosis at area of pacemaker wire entry. Consulting with Dr FieOneida Alard Dr KleCaryl Comes? ?Hospital visits: ?08/20/2021 - removal of  permanent pacemaker and  leads from left side. Inplantation leadless pacemaker (Micra) ?08/13/2021 - Radiology (Dr Loreta Ave) [place] removal ?03/16/2021 - Hospital Admission at [facility] for left subclavian angioplasty ? ? ?Objective: ? ?Lab Results  ?Component Value Date  ? CREATININE 1.63 (H) 08/28/2021  ? CREATININE 1.52 (H) 07/27/2021  ? CREATININE 1.62 (H) 07/26/2021  ? ? ?Lab Results  ?Component Value Date  ? HGBA1C 6.3 07/26/2021  ? ?Last diabetic Eye exam:  ?Lab Results  ?Component Value Date/Time  ? HMDIABEYEEXA No Retinopathy 04/05/2021 12:00 AM  ?  ?Last diabetic Foot exam: No results found for: HMDIABFOOTEX  ? ?   ?Component Value Date/Time  ? CHOL 168 07/26/2021 0912  ? CHOL 162 11/23/2020 0828  ? TRIG (H) 07/26/2021 0912  ?  690.0 Triglyceride is over 400; calculations on Lipids are invalid.  ? HDL 32.40 (L) 07/26/2021 0912  ? HDL 38 (L) 11/23/2020 0828  ? CHOLHDL 5 07/26/2021 0912  ? VLDL 44.0 (H) 11/24/2018 0815  ? [place] 89 11/23/2020 0828  ? [place]  04/18/2020 0956  ?   Comment:  ?   . ?LDL cholesterol not calculated. Triglyceride levels ?greater than 400 mg/dL invalidate calculated LDL results. ?. ?Reference range: <100 ?[name] ?Desirable range <100 mg/dL for primary prevention;   ?<70 mg/dL for patients with CHD or diabetic patients  ?with > or = 2 CHD risk factors. ?. ?LDL-C is now calculated using the Martin-Hopkins  ?calculation, which is a validated novel method providing  ?better accuracy than the Friedewald equation in the  ?estimation of LDL-C.  ?[name] et al. [name]. 1610;960(45): 2061-2068  ?(http://education.[employer].com/faq/FAQ164) ?  ? LDLDIRECT 58.0 07/26/2021 0912  ? ? ? ?  Latest Ref Rng & Units 08/28/2021  ? 12:55 PM 07/27/2021  ?  1:03 PM 07/26/2021  ?  9:12 AM  ?Hepatic Function  ?Total Protein 6.5 - 8.1 g/dL 6.9   7.0   7.3    ?Albumin 3.5 - 5.0 g/dL 4.3   4.6   4.8    ?AST 15 - 41 U/L _0 ?ALT 0 - 44 U/L _1 ?Alk Phosphatase 38 - 126 U/L 56   68   65    ?Total  Bilirubin 0.3 - 1.2 mg/dL 0.6   0.5   0.5    ? ? ?Lab Results  ?Component Value Date/Time  ? TSH 2.450 07/27/2018 03:24 PM  ? TSH 2.101 08/18/2017 06:08 AM  ? TSH 2.53 07/28/2015 10:04 AM  ? FREET4 0.87 06/21/2014 04:45 PM  ? ? ? ?  Latest Ref Rng & Units 09/25/2021  ?  2:18 PM 08/28/2021  ? 12:55 PM 07/27/2021  ?  1:03 PM  ?CBC  ?WBC 4.0 - 10.5 K/uL 9.8   9.0   7.6    ?Hemoglobin 13.0 - 17.0 g/dL 12.3   12.6   12.7    ?Hematocrit 39.0 - 52.0 % 39.0   39.4   39.1    ?Platelets 150 - 400 K/uL 169   226   189    ? ? ?Lab Results  ?Component Value Date/Time  ? VD25OH 16.1 (L) 07/27/2018 03:24 PM  ? ? ?Clinical ASCVD: Yes  ?The ASCVD Risk score (Arnett DK, et al., 2019) failed to calculate for the following reasons: ?  The 2019 ASCVD risk score is only valid for ages 62 to 56 ?  The patient has a  prior MI or stroke diagnosis   ? ? ?Social History  ? ?Tobacco Use  ?Smoking Status Never  ?Smokeless Tobacco Never  ? ?BP Readings from Last 3 Encounters:  ?09/25/21 128/72  ?08/28/21 119/63  ?07/27/21 (!) 129/57  ? ?Pulse Readings from Last 3 Encounters:  ?09/25/21 83  ?07/27/21 75  ?07/26/21 68  ? ?Wt Readings from Last 3 Encounters:  ?08/28/21 249 lb (112.9 kg)  ?07/27/21 252 lb (114.3 kg)  ?07/26/21 253 lb (114.8 kg)  ? ? ?Assessment: Review of patient past medical history, allergies, medications, health status, including review of consultants reports, laboratory and other test data, was performed as part of comprehensive evaluation and provision of chronic care management services.  ? ?SDOH:  (Social Determinants of Health) assessments and interventions performed:  ? ? ?CCM Care Plan ? ?Allergies  ?Allergen Reactions  ? Benazepril Swelling  ?  angioedema; he is not a candidate for any angiotensin receptor blockers because of this significant allergic reaction. ?Because of a history of documented adverse serious drug reaction;Medi Alert bracelet  is recommended  ? Hctz [Hydrochlorothiazide] Anaphylaxis and Swelling  ?  Tongue  and lip swelling ?  ? Benazepril Hcl Other (See Comments)  ? Aspirin Other (See Comments)  ?  Gastritis, cant take 325 Mg aspirin   ? Lactose Intolerance (Gi) Nausea And Vomiting  ? ? ?Medications Reviewed Today

## 2021-09-25 NOTE — Progress Notes (Signed)
?Hematology and Oncology Follow Up Visit ? ?Richard [name] ?[id number] ?[birth year] 81 y.o. ?09/25/2021 ? ? ?Principle Diagnosis:  ?Diffuse large cell non-Hodgkin's lymphoma (IPI = 3) - NOT "double hit" ?Pernicious anemia ?Iron deficiency secondary to bleeding ?  ?Past Therapy: ?R-CHOP - s/p cycle 8 - completed 08/2017 ?  ?Current Therapy:        ?Vitamin B12 1 mg IM every month ?Xgeva 120 mg subcu q 3 months - next dose due in 08/2021 ?IV iron as indicated  ?  ?Interim History:  Richard [name] is here today for follow-up and B 12 injection. He is doing quite well and has no complaints at this time.  ?He starting golfing again this week.  ?The swelling in his left arm is much better. He states that if he elevates it all the swelling resolves.  ?No fever, chills, n/v, cough, rash, dizziness, SOB, chest pain, palpitations, abdominal pain or changes in bowel or bladder habits.  ?No bleeding, bruising or petechiae.  ?No tenderness in his extremities at this time.  ?Neuropathy in his lower extremities unchanged from baseline.  ?No falls or syncope reported.  ?He has maintained a good appetite and is staying well hydrated. His weight is stable at 253 lbs.  ? ?ECOG Performance Status: 1 - Symptomatic but completely ambulatory ? ?Medications:  ?Allergies as of 09/25/2021   ? ?   Reactions  ? Benazepril Swelling  ? angioedema; he is not a candidate for any angiotensin receptor blockers because of this significant allergic reaction. ?Because of a history of documented adverse serious drug reaction;Medi Alert bracelet  is recommended  ? Hctz [hydrochlorothiazide] Anaphylaxis, Swelling  ? Tongue and lip swelling  ? Benazepril Hcl Other (See Comments)  ? Aspirin Other (See Comments)  ? Gastritis, cant take 325 Mg aspirin   ? Lactose Intolerance (gi) Nausea And Vomiting  ? ?  ? ?  ?Medication List  ?  ? ?  ? Accurate as of September 25, 2021  2:43 PM. If you have any questions, ask your nurse or doctor.  ?  ?  ? ?  ? ?acetaminophen 325 MG  tablet ?Commonly known as: TYLENOL ?Take 650 mg by mouth at bedtime as needed for moderate pain or headache. ?  ?allopurinol 300 MG tablet ?Commonly known as: ZYLOPRIM ?Take 450 mg by mouth daily. ?  ?aluminum hydroxide-magnesium carbonate 95-358 MG/15ML Susp ?Commonly known as: GAVISCON ?Take 15 mLs by mouth as needed for indigestion or heartburn. ?  ?amLODipine 5 MG tablet ?Commonly known as: NORVASC ?TAKE 1 TABLET BY MOUTH TWICE A DAY ?  ?apixaban 2.5 MG Tabs tablet ?Commonly known as: ELIQUIS ?Take 1 tablet (2.5 mg total) by mouth 2 (two) times daily. ?  ?atorvastatin 80 MG tablet ?Commonly known as: LIPITOR ?TAKE ONE TABLET BY MOUTH AT BEDTIME ?  ?azelastine 0.1 % nasal spray ?Commonly known as: ASTELIN ?PLACE 2 SPRAYS INTO BOTH NOSTRILS AT BEDTIME AS NEEDED FOR RHINITIS OR ALLERGIES. ?  ?dapagliflozin propanediol 10 MG Tabs tablet ?Commonly known as: FARXIGA ?Take 10 mg by mouth daily. ?  ?esomeprazole 40 MG capsule ?Commonly known as: NexIUM ?Take 1 capsule (40 mg total) by mouth daily. ?What changed: how much to take ?  ?eye wash Soln ?Place 1 drop into both eyes at bedtime. [place] ?  ?ezetimibe 10 MG tablet ?Commonly known as: ZETIA ?TAKE 1 TABLET BY MOUTH EVERY DAY ?  ?fenofibrate 160 MG tablet ?TAKE 1 TABLET BY MOUTH EVERY DAY ?  ?folic  acid 1 MG tablet ?Commonly known as: FOLVITE ?TAKE 2 TABLETS BY MOUTH EVERY DAY ?  ?freestyle lancets ?USE TWICE A DAY TO CHECK BLOOD SUGAR. DX E11.9 ?  ?FREESTYLE LITE test strip ?Generic drug: glucose blood ?USE TO TEST BLOOD SUGAR ONCE A DAY. DX CODE: E11.9 ?  ?glimepiride 1 MG tablet ?Commonly known as: AMARYL ?TAKE 1 TABLET BY MOUTH EVERY DAY WITH BREAKFAST ?  ?icosapent Ethyl 1 g capsule ?Commonly known as: VASCEPA ?TAKE 2 CAPSULES BY MOUTH TWICE A DAY ?  ?ICY HOT ADVANCED PAIN RELIEF EX ?Apply 1 application topically daily. Roll-on ?  ?Klor-Con M20 20 MEQ tablet ?Generic drug: potassium chloride SA ?TAKE 2 TABLETS (40 MEQ TOTAL) BY MOUTH 2 (TWO)  TIMES DAILY. ?  ?metFORMIN 1000 MG tablet ?Commonly known as: GLUCOPHAGE ?TAKE 1 & 1/2 TABLETS BY MOUTH EVERY MORNING AND 1 TABLET IN THE EVENING. ?  ?metoprolol tartrate 25 MG tablet ?Commonly known as: LOPRESSOR ?TAKE ONE-HALF TABLET BY MOUTH TWICE DAILY ?  ?nitroGLYCERIN 0.4 MG SL tablet ?Commonly known as: NITROSTAT ?PLACE 1 TABLET (0.4 MG TOTAL) UNDER THE TONGUE EVERY 5 (FIVE) MINUTES AS NEEDED FOR CHEST PAIN. ?  ?predniSONE 10 MG tablet ?Commonly known as: DELTASONE ?TAKE 3 TABLETS PO QD FOR 3 DAYS THEN TAKE 2 TABLETS PO QD FOR 3 DAYS THEN TAKE 1 TABLET PO QD FOR 3 DAYS THEN TAKE 1/2 TAB PO QD FOR 3 DAYS ?  ?pregabalin 150 MG capsule ?Commonly known as: LYRICA ?Take 1 capsule (150 mg total) by mouth 2 (two) times daily. ?  ?psyllium 58.6 % powder ?Commonly known as: METAMUCIL ?Take 1 packet by mouth daily as needed (constipation). ?  ?torsemide 20 MG tablet ?Commonly known as: DEMADEX ?Take 20 mg by mouth daily. ?  ? ?  ? ? ?Allergies:  ?Allergies  ?Allergen Reactions  ? Benazepril Swelling  ?  angioedema; he is not a candidate for any angiotensin receptor blockers because of this significant allergic reaction. ?Because of a history of documented adverse serious drug reaction;Medi Alert bracelet  is recommended  ? Hctz [Hydrochlorothiazide] Anaphylaxis and Swelling  ?  Tongue and lip swelling ?  ? Benazepril Hcl Other (See Comments)  ? Aspirin Other (See Comments)  ?  Gastritis, cant take 325 Mg aspirin   ? Lactose Intolerance (Gi) Nausea And Vomiting  ? ? ?Past Medical History, Surgical history, Social history, and Family History were reviewed and updated. ? ?Review of Systems: ?All other 10 point review of systems is negative.  ? ?Physical Exam: ? oral temperature is 97 ?F (36.1 ?C) (abnormal). His blood pressure is 128/72 and his pulse is 83. His respiration is 17 and oxygen saturation is 97%.  ? ?Wt Readings from Last 3 Encounters:  ?08/28/21 249 lb (112.9 kg)  ?07/27/21 252 lb (114.3 kg)  ?07/26/21 253 lb  (114.8 kg)  ? ? ?Ocular: Sclerae unicteric, pupils equal, round and reactive to light ?Ear-nose-throat: Oropharynx clear, dentition fair ?Lymphatic: No cervical or supraclavicular adenopathy ?Lungs no rales or rhonchi, good excursion bilaterally ?Heart regular rate and rhythm, no murmur appreciated ?Abd soft, nontender, positive bowel sounds ?MSK no focal spinal tenderness, no joint edema ?Neuro: non-focal, well-oriented, appropriate affect ?Breasts: Deferred  ? ?Lab Results  ?Component Value Date  ? WBC 9.8 09/25/2021  ? HGB 12.3 (L) 09/25/2021  ? HCT 39.0 09/25/2021  ? MCV 89.7 09/25/2021  ? PLT 169 09/25/2021  ? ?Lab Results  ?Component Value Date  ? FERRITIN 1,035 (H) 08/28/2021  ? IRON  73 08/28/2021  ? TIBC 372 08/28/2021  ? UIBC 299 08/28/2021  ? IRONPCTSAT 20 08/28/2021  ? ?Lab Results  ?Component Value Date  ? RETICCTPCT 2.2 09/25/2021  ? RBC 4.36 09/25/2021  ? RETICCTABS 52.1 11/22/2011  ? ?No results found for: KPAFRELGTCHN, LAMBDASER, KAPLAMBRATIO ?Lab Results  ?Component Value Date  ? IGA 159 03/09/2012  ? ?Lab Results  ?Component Value Date  ? ALBUMINELP 4.2 07/27/2018  ? MSPIKE Not Observed 07/27/2018  ? ?  Chemistry   ?   ?Component Value Date/Time  ? NA 138 08/28/2021 1255  ? NA 136 03/16/2019 0938  ? NA 144 06/27/2017 0857  ? K 4.7 08/28/2021 1255  ? K 3.9 06/27/2017 0857  ? CL 100 08/28/2021 1255  ? CL 103 06/27/2017 0857  ? CO2 28 08/28/2021 1255  ? CO2 26 06/27/2017 0857  ? BUN 30 (H) 08/28/2021 1255  ? BUN 34 (H) 03/16/2019 9242  ? BUN 12 06/27/2017 0857  ? CREATININE 1.63 (H) 08/28/2021 1255  ? CREATININE 1.65 (H) 04/18/2020 0956  ?    ?Component Value Date/Time  ? CALCIUM 10.3 08/28/2021 1255  ? CALCIUM 9.2 06/27/2017 0857  ? ALKPHOS 56 08/28/2021 1255  ? ALKPHOS 128 (H) 06/27/2017 0857  ? AST 14 (L) 08/28/2021 1255  ? ALT 10 08/28/2021 1255  ? ALT 24 06/27/2017 0857  ? BILITOT 0.6 08/28/2021 1255  ?  ? ? ? ?Impression and Plan: Richard [name] is a very pleasant 81 yo caucasian gentleman with  diffuse large B-cell lymphoma (not "double hit" lymphoma). He completed treatment in February 2019.  ?B 12 injection given.  ?Iron studies pending.  ?Follow-up in 1 month.  ? ?Lottie Dawson, NP ?3/28/20232

## 2021-09-25 NOTE — Patient Instructions (Signed)
Vitamin B12 Deficiency Vitamin B12 deficiency means that your body does not have enough vitamin B12. The body needs this important vitamin: To make red blood cells. To make genes (DNA). To help the nerves work. If you do not have enough vitamin B12 in your body, you can have health problems, such as not having enough red blood cells in the blood (anemia). What are the causes? Not eating enough foods that contain vitamin B12. Not being able to take in (absorb) vitamin B12 from the food that you eat. Certain diseases. A condition in which the body does not make enough of a certain protein. This results in your body not taking in enough vitamin B12. Having a surgery in which part of the stomach or small intestine is taken out. Taking medicines that make it hard for the body to take in vitamin B12. These include: Heartburn medicines. Some medicines that are used to treat diabetes. What increases the risk? Being an older adult. Eating a vegetarian or vegan diet that does not include any foods that come from animals. Not eating enough foods that contain vitamin B12 while you are pregnant. Taking certain medicines. Having alcoholism. What are the signs or symptoms? In some cases, there are no symptoms. If the condition leads to too few blood cells or nerve damage, symptoms can occur, such as: Feeling weak or tired. Not being hungry. Losing feeling (numbness) or tingling in your hands and feet. Redness and burning of the tongue. Feeling sad (depressed). Confusion or memory problems. Trouble walking. If anemia is very bad, symptoms can include: Being short of breath. Being dizzy. Having a very fast heartbeat. How is this treated? Changing the way you eat and drink, such as: Eating more foods that contain vitamin B12. Drinking little or no alcohol. Getting vitamin B12 shots. Taking vitamin B12 supplements by mouth (orally). Your doctor will tell you the dose that is best for you. Follow  these instructions at home: Eating and drinking  Eat foods that come from animals and have a lot of vitamin B12 in them. These include: Meats and poultry. This includes beef, pork, chicken, turkey, and organ meats, such as liver. Seafood, such as clams, rainbow trout, salmon, tuna, and haddock. Eggs. Dairy foods such as milk, yogurt, and cheese. Eat breakfast cereals that have vitamin B12 added to them (are fortified). Check the label. The items listed above may not be a complete list of foods and beverages you can eat and drink. Contact a dietitian for more information. Alcohol use Do not drink alcohol if: Your doctor tells you not to drink. You are pregnant, may be pregnant, or are planning to become pregnant. If you drink alcohol: Limit how much you have to: 0-1 drink a day for women. 0-2 drinks a day for men. Know how much alcohol is in your drink. In the [place] one drink equals one 12 oz bottle of beer (355 mL), one 5 oz glass of wine (148 mL), or one 1 oz glass of hard liquor (44 mL). General instructions Get any vitamin B12 shots if told by your doctor. Take supplements only as told by your doctor. Follow the directions. Keep all follow-up visits. Contact a doctor if: Your symptoms come back. Your symptoms get worse or do not get better with treatment. Get help right away if: You have trouble breathing. You have a very fast heartbeat. You have chest pain. You get dizzy. You faint. These symptoms may be an emergency. Get help right away. Call 911.   Do not wait to see if the symptoms will go away. Do not drive yourself to the hospital. Summary Vitamin B12 deficiency means that your body is not getting enough of the vitamin. In some cases, there are no symptoms of this condition. Treatment may include making a change in the way you eat and drink, getting shots, or taking supplements. Eat foods that have vitamin B12 in them. This information is not intended to replace advice  given to you by your health care provider. Make sure you discuss any questions you have with your health care provider. Document Revised: 02/09/2021 Document Reviewed: 02/09/2021 Elsevier Patient Education  2022 [employer] Inc.  

## 2021-09-25 NOTE — Telephone Encounter (Signed)
Per 09/25/21 los - gave upcoming appointments - confirmed ?

## 2021-09-26 LAB — IRON AND IRON BINDING CAPACITY (CC-WL,HP ONLY)
Iron: 53 ug/dL (ref 45–182)
Saturation Ratios: 15 % — ABNORMAL LOW (ref 17.9–39.5)
TIBC: 363 ug/dL (ref 250–450)
UIBC: 310 ug/dL (ref 117–376)

## 2021-09-26 LAB — FERRITIN: Ferritin: 1126 ng/mL — ABNORMAL HIGH (ref 24–336)

## 2021-09-28 DIAGNOSIS — E1122 Type 2 diabetes mellitus with diabetic chronic kidney disease: Secondary | ICD-10-CM | POA: Diagnosis not present

## 2021-09-28 DIAGNOSIS — E1159 Type 2 diabetes mellitus with other circulatory complications: Secondary | ICD-10-CM | POA: Diagnosis not present

## 2021-09-28 DIAGNOSIS — I5030 Unspecified diastolic (congestive) heart failure: Secondary | ICD-10-CM

## 2021-09-28 DIAGNOSIS — Z7984 Long term (current) use of oral hypoglycemic drugs: Secondary | ICD-10-CM | POA: Diagnosis not present

## 2021-09-28 DIAGNOSIS — N183 Chronic kidney disease, stage 3 unspecified: Secondary | ICD-10-CM | POA: Diagnosis not present

## 2021-09-28 DIAGNOSIS — I130 Hypertensive heart and chronic kidney disease with heart failure and stage 1 through stage 4 chronic kidney disease, or unspecified chronic kidney disease: Secondary | ICD-10-CM | POA: Diagnosis not present

## 2021-09-28 DIAGNOSIS — I2510 Atherosclerotic heart disease of native coronary artery without angina pectoris: Secondary | ICD-10-CM

## 2021-09-28 DIAGNOSIS — E785 Hyperlipidemia, unspecified: Secondary | ICD-10-CM | POA: Diagnosis not present

## 2021-09-28 NOTE — Patient Instructions (Signed)
Mr.  Beckstrand ?It was a pleasure speaking with you  ?Below is a summary of your health goals and care plan ? ?Patient Goals/Self-Care Activities ?Over the next 90 days, patient will:  ?take medications as prescribed ?check glucose daily, document, and provide at future appointments ?check blood pressure 2 to 3 times daily, document, and provide at future appointments ?target a minimum of 150 minutes of moderate intensity exercise weekly ?Engage in dietary modifications by continuing to cut back on sugar containing beverages and foods ? ?If you have any questions or concerns, please feel free to contact me either at the phone number below or with a MyChart message.  ? ?Keep up the good work! ? ?[name], PharmD ?Clinical Pharmacist ?[facility] ?[place] ?[phone] (direct line)  ?[phone] (main office number) ? ? ? ?Chronic Care Management Care Plan ?  ?Hypertension / Heart ?BP Readings from Last 3 Encounters:  ?09/25/21 128/72  ?08/28/21 119/63  ?07/27/21 (!) 129/57  ? ?Pharmacist Clinical Goal(s): ?Over the next 90 days, patient will work with PharmD and providers to maintain BP goal <130/80 and minimize symptoms of heart failure like swelling and shortness of breath ?Current regimen:  ?Amlodipine '5mg'$  daily  ?Metoprolol Tartrate '25mg'$  - take 0.5 tablet (= 12.5 mg) twice a day ?toresmide '20mg'$  - take 1 tablet daily  ?Potassium chloride 20 mEq - take 2 tablets = 69mq twice a day ?Farxiga '10mg'$  daily ?Interventions: ?Discussed blood pressure goal, diet, and exercise ?Check weight daily  ?Patient self care activities - Over the next 90 days, patient will: ?Check blood pressure 1 to 2 times per week, document, and provide at future appointments ?Check weight daily and report if you gain more than 3 pounds in 24 hours or 5 pounds in 1 week.  ?Ensure daily salt intake < 2300 mg/day ? ?Hyperlipidemia/CAD ?Lipid Panel  ?Lab Results  ?Component Value Date  ? CHOL 168 07/26/2021  ? HDL 32.40  (L) 07/26/2021  ? L[place]89 11/23/2020  ? LDLDIRECT 58.0 07/26/2021  ? TRIG (H) 07/26/2021  ?  690.0 Triglyceride is over 400; calculations on Lipids are invalid.  ? CHOLHDL 5 07/26/2021  ? ? ?Pharmacist Clinical Goal(s): ?Over the next 180 days, patient will work with PharmD and providers to achieve LDL goal < 70 and TG <150 ?Current regimen:  ?Atorvastatin '80mg'$  daily ?Ezetimibe '10mg'$  daily ?Fenofibrate '160mg'$  daily ?Vascepa 1g - take 2 capsules twice a day  ?Nitroglycerin 0.'4mg'$  as needed ?Interventions: ?Discussed diet and exercise ?Discussed risk associated with elevated triglycerides including pancreatitis  ?Patient self care activities - Over the next 90 days, patient will: ?Maintain cholesterol medication regimen.  ?Continue with exercise and limiting foods and beverage containing sugar and high carbohydrate foods.  ? ?Diabetes ?Lab Results  ?Component Value Date/Time  ? HGBA1C 6.3 07/26/2021 09:12 AM  ? HGBA1C 6.3 (H) 11/23/2020 08:28 AM  ? ?Pharmacist Clinical Goal(s): ?Over the next 90 days, patient will work with PharmD and providers to achieve A1c goal <7% ?Current regimen:  ?Glimepiride '1mg'$  daily  ?Metformin '1000mg'$  1.5 tablets each morning with food and 1 tablet each evening with food ?Farxiga '10mg'$  daily  ?Interventions: ?Discussed diet and exercise ?Patient self care activities - Over the next 90 days, patient will: ?Check blood sugar once daily, document, and provide at future appointments ?Contact provider with any episodes of hypoglycemia ? ?Medication management ?Pharmacist Clinical Goal(s): ?Over the next 90 days, patient will work with PharmD and providers to maintain optimal medication adherence ?Current  pharmacy: [facility] [place] ?Interventions ?Comprehensive medication review performed. ?Reviewed refill history and assessed adherence ?Continue current medication management strategy ?Patient self care activities - Over the next 90 days, patient will: ?Focus on medication adherence by filling  medications appropriately  ?Take medications as prescribed ?Report any questions or concerns to PharmD and/or provider(s) ? ?Patient Goals/Self-Care Activities ?Over the next 90 days, patient will:  ?take medications as prescribed ?check glucose daily, document, and provide at future appointments ?check blood pressure 2 to 3 times daily, document, and provide at future appointments ?target a minimum of 150 minutes of moderate intensity exercise weekly ?Engage in dietary modifications by continuing to cut back on sugar containing beverages and foods ? ?Patient verbalizes understanding of instructions and care plan provided today and agrees to view in [place]. Active MyChart status confirmed with patient.    ?

## 2021-10-02 ENCOUNTER — Inpatient Hospital Stay: Payer: Medicare Other | Attending: Hematology & Oncology

## 2021-10-02 ENCOUNTER — Other Ambulatory Visit: Payer: Self-pay | Admitting: Family Medicine

## 2021-10-02 VITALS — BP 118/55 | HR 72 | Temp 97.7°F | Resp 18

## 2021-10-02 DIAGNOSIS — E11649 Type 2 diabetes mellitus with hypoglycemia without coma: Secondary | ICD-10-CM

## 2021-10-02 DIAGNOSIS — D508 Other iron deficiency anemias: Secondary | ICD-10-CM

## 2021-10-02 DIAGNOSIS — D510 Vitamin B12 deficiency anemia due to intrinsic factor deficiency: Secondary | ICD-10-CM | POA: Diagnosis not present

## 2021-10-02 DIAGNOSIS — D519 Vitamin B12 deficiency anemia, unspecified: Secondary | ICD-10-CM

## 2021-10-02 MED ORDER — DENOSUMAB 120 MG/1.7ML ~~LOC~~ SOLN
120.0000 mg | Freq: Once | SUBCUTANEOUS | Status: AC
  Administered 2021-10-02: 120 mg via SUBCUTANEOUS
  Filled 2021-10-02: qty 1.7

## 2021-10-02 MED ORDER — SODIUM CHLORIDE 0.9 % IV SOLN: INTRAVENOUS | Status: DC

## 2021-10-02 MED ORDER — SODIUM CHLORIDE 0.9 % IV SOLN
510.0000 mg | Freq: Once | INTRAVENOUS | Status: AC
  Administered 2021-10-02: 510 mg via INTRAVENOUS
  Filled 2021-10-02: qty 510

## 2021-10-02 NOTE — Patient Instructions (Signed)
Ferumoxytol Injection ?What is this medication? ?FERUMOXYTOL (FER ue MOX i tol) treats low levels of iron in your body (iron deficiency anemia). Iron is a mineral that plays an important role in making red blood cells, which carry oxygen from your lungs to the rest of your body. ?This medicine may be used for other purposes; ask your health care provider or pharmacist if you have questions. ?COMMON BRAND NAME(S): Feraheme ?What should I tell my care team before I take this medication? ?They need to know if you have any of these conditions: ?Anemia not caused by low iron levels ?High levels of iron in the blood ?Magnetic resonance imaging (MRI) test scheduled ?An unusual or allergic reaction to iron, other medications, foods, dyes, or preservatives ?Pregnant or trying to get pregnant ?Breast-feeding ?How should I use this medication? ?This medication is for injection into a vein. It is given in a hospital or clinic setting. ?Talk to your care team the use of this medication in children. Special care may be needed. ?Overdosage: If you think you have taken too much of this medicine contact a poison control center or emergency room at once. ?NOTE: This medicine is only for you. Do not share this medicine with others. ?What if I miss a dose? ?It is important not to miss your dose. Call your care team if you are unable to keep an appointment. ?What may interact with this medication? ?Other iron products ?This list may not describe all possible interactions. Give your health care provider a list of all the medicines, herbs, non-prescription drugs, or dietary supplements you use. Also tell them if you smoke, drink alcohol, or use illegal drugs. Some items may interact with your medicine. ?What should I watch for while using this medication? ?Visit your care team regularly. Tell your care team if your symptoms do not start to get better or if they get worse. You may need blood work done while you are taking this  medication. ?You may need to follow a special diet. Talk to your care team. Foods that contain iron include: whole grains/cereals, dried fruits, beans, or peas, leafy green vegetables, and organ meats (liver, kidney). ?What side effects may I notice from receiving this medication? ?Side effects that you should report to your care team as soon as possible: ?Allergic reactions--skin rash, itching, hives, swelling of the face, lips, tongue, or throat ?Low blood pressure--dizziness, feeling faint or lightheaded, blurry vision ?Shortness of breath ?Side effects that usually do not require medical attention (report to your care team if they continue or are bothersome): ?Flushing ?Headache ?Joint pain ?Muscle pain ?Nausea ?Pain, redness, or irritation at injection site ?This list may not describe all possible side effects. Call your doctor for medical advice about side effects. You may report side effects to FDA at 1-800-FDA-1088. ?Where should I keep my medication? ?This medication is given in a hospital or clinic and will not be stored at home. ?NOTE: This sheet is a summary. It may not cover all possible information. If you have questions about this medicine, talk to your doctor, pharmacist, or health care provider. ?? 2022 Elsevier/Gold Standard (2020-11-10 00:00:00) ? ?Denosumab injection ?What is this medication? ?DENOSUMAB (den oh sue mab) slows bone breakdown. Prolia is used to treat osteoporosis in women after menopause and in men, and in people who are taking corticosteroids for 6 months or more. Delton See is used to treat a high calcium level due to cancer and to prevent bone fractures and other bone problems caused by  multiple myeloma or cancer bone metastases. Delton See is also used to treat giant cell tumor of the bone. ?This medicine may be used for other purposes; ask your health care provider or pharmacist if you have questions. ?COMMON BRAND NAME(S): Prolia, XGEVA ?What should I tell my care team before I take  this medication? ?They need to know if you have any of these conditions: ?dental disease ?having surgery or tooth extraction ?infection ?kidney disease ?low levels of calcium or Vitamin D in the blood ?malnutrition ?on hemodialysis ?skin conditions or sensitivity ?thyroid or parathyroid disease ?an unusual reaction to denosumab, other medicines, foods, dyes, or preservatives ?pregnant or trying to get pregnant ?breast-feeding ?How should I use this medication? ?This medicine is for injection under the skin. It is given by a health care professional in a hospital or clinic setting. ?A special MedGuide will be given to you before each treatment. Be sure to read this information carefully each time. ?For Prolia, talk to your pediatrician regarding the use of this medicine in children. Special care may be needed. For Delton See, talk to your pediatrician regarding the use of this medicine in children. While this drug may be prescribed for children as young as 13 years for selected conditions, precautions do apply. ?Overdosage: If you think you have taken too much of this medicine contact a poison control center or emergency room at once. ?NOTE: This medicine is only for you. Do not share this medicine with others. ?What if I miss a dose? ?It is important not to miss your dose. Call your doctor or health care professional if you are unable to keep an appointment. ?What may interact with this medication? ?Do not take this medicine with any of the following medications: ?other medicines containing denosumab ?This medicine may also interact with the following medications: ?medicines that lower your chance of fighting infection ?steroid medicines like prednisone or cortisone ?This list may not describe all possible interactions. Give your health care provider a list of all the medicines, herbs, non-prescription drugs, or dietary supplements you use. Also tell them if you smoke, drink alcohol, or use illegal drugs. Some items may  interact with your medicine. ?What should I watch for while using this medication? ?Visit your doctor or health care professional for regular checks on your progress. Your doctor or health care professional may order blood tests and other tests to see how you are doing. ?Call your doctor or health care professional for advice if you get a fever, chills or sore throat, or other symptoms of a cold or flu. Do not treat yourself. This drug may decrease your body's ability to fight infection. Try to avoid being around people who are sick. ?You should make sure you get enough calcium and vitamin D while you are taking this medicine, unless your doctor tells you not to. Discuss the foods you eat and the vitamins you take with your health care professional. ?See your dentist regularly. Brush and floss your teeth as directed. Before you have any dental work done, tell your dentist you are receiving this medicine. ?Do not become pregnant while taking this medicine or for 5 months after stopping it. Talk with your doctor or health care professional about your birth control options while taking this medicine. Women should inform their doctor if they wish to become pregnant or think they might be pregnant. There is a potential for serious side effects to an unborn child. Talk to your health care professional or pharmacist for more information. ?What side  effects may I notice from receiving this medication? ?Side effects that you should report to your doctor or health care professional as soon as possible: ?allergic reactions like skin rash, itching or hives, swelling of the face, lips, or tongue ?bone pain ?breathing problems ?dizziness ?jaw pain, especially after dental work ?redness, blistering, peeling of the skin ?signs and symptoms of infection like fever or chills; cough; sore throat; pain or trouble passing urine ?signs of low calcium like fast heartbeat, muscle cramps or muscle pain; pain, tingling, numbness in the hands  or feet; seizures ?unusual bleeding or bruising ?unusually weak or tired ?Side effects that usually do not require medical attention (report to your doctor or health care professional if they continue or are bothe

## 2021-10-02 NOTE — Progress Notes (Signed)
Patient refused to wait 30 minutes post infusion. Released stable and ASX. 

## 2021-10-09 ENCOUNTER — Other Ambulatory Visit: Payer: Self-pay | Admitting: Family

## 2021-10-09 ENCOUNTER — Inpatient Hospital Stay: Payer: Medicare Other

## 2021-10-09 ENCOUNTER — Other Ambulatory Visit: Payer: Self-pay | Admitting: Family Medicine

## 2021-10-09 VITALS — BP 122/62 | HR 76 | Temp 98.0°F | Resp 17

## 2021-10-09 DIAGNOSIS — D519 Vitamin B12 deficiency anemia, unspecified: Secondary | ICD-10-CM

## 2021-10-09 DIAGNOSIS — E1151 Type 2 diabetes mellitus with diabetic peripheral angiopathy without gangrene: Secondary | ICD-10-CM

## 2021-10-09 DIAGNOSIS — D510 Vitamin B12 deficiency anemia due to intrinsic factor deficiency: Secondary | ICD-10-CM | POA: Diagnosis not present

## 2021-10-09 DIAGNOSIS — D508 Other iron deficiency anemias: Secondary | ICD-10-CM

## 2021-10-09 MED ORDER — SODIUM CHLORIDE 0.9 % IV SOLN: Freq: Once | INTRAVENOUS | Status: AC

## 2021-10-09 MED ORDER — SODIUM CHLORIDE 0.9 % IV SOLN
510.0000 mg | Freq: Once | INTRAVENOUS | Status: AC
  Administered 2021-10-09: 510 mg via INTRAVENOUS
  Filled 2021-10-09: qty 510

## 2021-10-09 NOTE — Progress Notes (Signed)
Pt declined to stay for post infusion observation period. Pt stated he has tolerated medication multiple times prior without difficulty. Pt aware to call clinic with any questions or concerns. Pt verbalized understanding and had no further questions.   

## 2021-10-09 NOTE — Patient Instructions (Signed)

## 2021-10-16 ENCOUNTER — Ambulatory Visit (INDEPENDENT_AMBULATORY_CARE_PROVIDER_SITE_OTHER): Payer: Medicare Other | Admitting: Podiatry

## 2021-10-16 ENCOUNTER — Encounter: Payer: Self-pay | Admitting: Podiatry

## 2021-10-16 DIAGNOSIS — M79675 Pain in left toe(s): Secondary | ICD-10-CM

## 2021-10-16 DIAGNOSIS — R600 Localized edema: Secondary | ICD-10-CM | POA: Diagnosis not present

## 2021-10-16 DIAGNOSIS — Z950 Presence of cardiac pacemaker: Secondary | ICD-10-CM | POA: Diagnosis not present

## 2021-10-16 DIAGNOSIS — Z9889 Other specified postprocedural states: Secondary | ICD-10-CM | POA: Diagnosis not present

## 2021-10-16 DIAGNOSIS — B351 Tinea unguium: Secondary | ICD-10-CM | POA: Diagnosis not present

## 2021-10-16 DIAGNOSIS — Q828 Other specified congenital malformations of skin: Secondary | ICD-10-CM

## 2021-10-16 DIAGNOSIS — E1142 Type 2 diabetes mellitus with diabetic polyneuropathy: Secondary | ICD-10-CM

## 2021-10-16 DIAGNOSIS — M79674 Pain in right toe(s): Secondary | ICD-10-CM

## 2021-10-16 NOTE — Progress Notes (Signed)
This patient returns to my office for at risk foot care.  This patient requires this care by a professional since this patient will be at risk due to having diabetic neuropathy  and coagulation defect.  Patient is taking eliquis. This patient is unable to cut nails himself since the patient cannot reach his nails.These nails are painful walking and wearing shoes.  Patient says the callus on his right forefoot has become painful when walking.  This patient presents for at risk foot care today. ? ?General Appearance  Alert, conversant and in no acute stress. ? ?Vascular  Dorsalis pedis and posterior tibial  pulses are palpable  bilaterally.  Capillary return is within normal limits  bilaterally. Temperature is within normal limits  bilaterally. ? ?Neurologic  Senn-Weinstein monofilament wire test within normal limits  Left foot.  LOPS right foot is absent.  . Muscle power within normal limits bilaterally. ? ?Nails Thick disfigured discolored nails with subungual debris  from hallux to fifth toes bilaterally. No evidence of bacterial infection or drainage bilaterally. ? ?Orthopedic  No limitations of motion  feet .  No crepitus or effusions noted.  No bony pathology or digital deformities noted.  Plantar flexed fifth metatarsal right foot. ? ?Skin  normotropic skin with noted bilaterally.  No signs of infections or ulcers noted ? ?Onychomycosis  Pain in right toes  Pain in left toes  Porokeratosis sub 5th right ? ?Consent was obtained for treatment procedures.   Mechanical debridement of nails 1-5  bilaterally performed with a nail nipper.  Filed with dremel without incident.  ? ? ?Return office visit   12 weeks.                  Told patient to return for periodic foot care and evaluation due to potential at risk complications. ? ? ?Gardiner Barefoot DPM  ?

## 2021-10-22 DIAGNOSIS — I871 Compression of vein: Secondary | ICD-10-CM | POA: Diagnosis not present

## 2021-10-22 DIAGNOSIS — R600 Localized edema: Secondary | ICD-10-CM | POA: Diagnosis not present

## 2021-10-22 DIAGNOSIS — E1165 Type 2 diabetes mellitus with hyperglycemia: Secondary | ICD-10-CM | POA: Diagnosis not present

## 2021-10-23 ENCOUNTER — Inpatient Hospital Stay: Payer: Medicare Other

## 2021-10-23 ENCOUNTER — Inpatient Hospital Stay: Payer: Medicare Other | Admitting: Family

## 2021-10-26 ENCOUNTER — Inpatient Hospital Stay: Payer: Medicare Other

## 2021-10-26 ENCOUNTER — Other Ambulatory Visit: Payer: Self-pay

## 2021-10-26 ENCOUNTER — Encounter: Payer: Self-pay | Admitting: Family

## 2021-10-26 ENCOUNTER — Inpatient Hospital Stay (HOSPITAL_BASED_OUTPATIENT_CLINIC_OR_DEPARTMENT_OTHER): Payer: Medicare Other | Admitting: Family

## 2021-10-26 VITALS — BP 109/52 | HR 73 | Temp 97.8°F | Resp 18 | Wt 251.0 lb

## 2021-10-26 DIAGNOSIS — C8332 Diffuse large B-cell lymphoma, intrathoracic lymph nodes: Secondary | ICD-10-CM | POA: Diagnosis not present

## 2021-10-26 DIAGNOSIS — D508 Other iron deficiency anemias: Secondary | ICD-10-CM

## 2021-10-26 DIAGNOSIS — D519 Vitamin B12 deficiency anemia, unspecified: Secondary | ICD-10-CM

## 2021-10-26 DIAGNOSIS — D510 Vitamin B12 deficiency anemia due to intrinsic factor deficiency: Secondary | ICD-10-CM | POA: Diagnosis not present

## 2021-10-26 DIAGNOSIS — D500 Iron deficiency anemia secondary to blood loss (chronic): Secondary | ICD-10-CM

## 2021-10-26 LAB — CBC WITH DIFFERENTIAL (CANCER CENTER ONLY)
Abs Immature Granulocytes: 0.07 10*3/uL (ref 0.00–0.07)
Basophils Absolute: 0 10*3/uL (ref 0.0–0.1)
Basophils Relative: 1 %
Eosinophils Absolute: 0.1 10*3/uL (ref 0.0–0.5)
Eosinophils Relative: 2 %
HCT: 40.3 % (ref 39.0–52.0)
Hemoglobin: 12.6 g/dL — ABNORMAL LOW (ref 13.0–17.0)
Immature Granulocytes: 1 %
Lymphocytes Relative: 22 %
Lymphs Abs: 1.7 10*3/uL (ref 0.7–4.0)
MCH: 28.6 pg (ref 26.0–34.0)
MCHC: 31.3 g/dL (ref 30.0–36.0)
MCV: 91.6 fL (ref 80.0–100.0)
Monocytes Absolute: 0.7 10*3/uL (ref 0.1–1.0)
Monocytes Relative: 9 %
Neutro Abs: 5.2 10*3/uL (ref 1.7–7.7)
Neutrophils Relative %: 65 %
Platelet Count: 169 10*3/uL (ref 150–400)
RBC: 4.4 MIL/uL (ref 4.22–5.81)
RDW: 17.4 % — ABNORMAL HIGH (ref 11.5–15.5)
WBC Count: 7.8 10*3/uL (ref 4.0–10.5)
nRBC: 0 % (ref 0.0–0.2)

## 2021-10-26 LAB — IRON AND IRON BINDING CAPACITY (CC-WL,HP ONLY)
Iron: 82 ug/dL (ref 45–182)
Saturation Ratios: 21 % (ref 17.9–39.5)
TIBC: 388 ug/dL (ref 250–450)
UIBC: 306 ug/dL (ref 117–376)

## 2021-10-26 LAB — LACTATE DEHYDROGENASE: LDH: 139 U/L (ref 98–192)

## 2021-10-26 LAB — VITAMIN B12: Vitamin B-12: 183 pg/mL (ref 180–914)

## 2021-10-26 LAB — RETICULOCYTES
Immature Retic Fract: 27.8 % — ABNORMAL HIGH (ref 2.3–15.9)
RBC.: 4.45 MIL/uL (ref 4.22–5.81)
Retic Count, Absolute: 76.5 10*3/uL (ref 19.0–186.0)
Retic Ct Pct: 1.7 % (ref 0.4–3.1)

## 2021-10-26 LAB — FERRITIN: Ferritin: 1377 ng/mL — ABNORMAL HIGH (ref 24–336)

## 2021-10-26 MED ORDER — CYANOCOBALAMIN 1000 MCG/ML IJ SOLN
1000.0000 ug | Freq: Once | INTRAMUSCULAR | Status: AC
  Administered 2021-10-26: 1000 ug via INTRAMUSCULAR
  Filled 2021-10-26: qty 1

## 2021-10-26 NOTE — Progress Notes (Signed)
?Hematology and Oncology Follow Up Visit ? ?Richard [name] ?[id number] ?August 10, 1940 81 y.o. ?10/26/2021 ? ? ?Principle Diagnosis:  ?Diffuse large cell non-Hodgkin's lymphoma (IPI = 3) - NOT "double hit" ?Pernicious anemia ?Iron deficiency secondary to bleeding ?  ?Past Therapy: ?R-CHOP - s/p cycle 8 - completed 08/2017 ?  ?Current Therapy:        ?Vitamin B12 1 mg IM every month ?Xgeva 120 mg subcu q 3 months - next dose due in 08/2021 ?IV iron as indicated   ? ?Interim History:  Richard [name] is here today for follow-up and B 12 injection. He is doing well.  ?He states that he had a venogram on the left arm and that unfortunately the vein cannot be stented. He states that when he gets up in the morning all the swelling is gone out of the left arm but as the day goes on it accumulates. Elevating his arm helps. Radial pulses are 2+.  ?No tenderness in his extremities.  ?Neuropathy in his lower extremities is unchanged from baseline.  ?No falls or syncope to report.  ?He is eating well and staying hydrated. His weight is stable at 251 lbs.  ?No fever, chills, n/v, rash, dizziness, SOB, chest pain, palpitations, abdominal pain or changes in bowel or bladder habits.  ?He states that since having Covid recently he will have a little pink tinged blood in his sputum with cough once a day.  ?No other blood loss noted. No petechiae.  ? ?ECOG Performance Status: 1 - Symptomatic but completely ambulatory ? ?Medications:  ?Allergies as of 10/26/2021   ? ?   Reactions  ? Benazepril Swelling  ? angioedema; he is not a candidate for any angiotensin receptor blockers because of this significant allergic reaction. ?Because of a history of documented adverse serious drug reaction;Medi Alert bracelet  is recommended  ? Hctz [hydrochlorothiazide] Anaphylaxis, Swelling  ? Tongue and lip swelling  ? Benazepril Hcl Other (See Comments)  ? Aspirin Other (See Comments)  ? Gastritis, cant take 325 Mg aspirin   ? Lactose Intolerance (gi) Nausea And  Vomiting  ? ?  ? ?  ?Medication List  ?  ? ?  ? Accurate as of October 26, 2021  1:17 PM. If you have any questions, ask your nurse or doctor.  ?  ?  ? ?  ? ?acetaminophen 325 MG tablet ?Commonly known as: TYLENOL ?Take 650 mg by mouth at bedtime as needed for moderate pain or headache. ?  ?allopurinol 300 MG tablet ?Commonly known as: ZYLOPRIM ?Take 450 mg by mouth daily. ?  ?aluminum hydroxide-magnesium carbonate 95-358 MG/15ML Susp ?Commonly known as: GAVISCON ?Take 15 mLs by mouth as needed for indigestion or heartburn. ?  ?amLODipine 5 MG tablet ?Commonly known as: NORVASC ?TAKE 1 TABLET BY MOUTH TWICE A DAY ?  ?apixaban 2.5 MG Tabs tablet ?Commonly known as: ELIQUIS ?Take 1 tablet (2.5 mg total) by mouth 2 (two) times daily. ?  ?atorvastatin 80 MG tablet ?Commonly known as: LIPITOR ?TAKE ONE TABLET BY MOUTH AT BEDTIME ?  ?azelastine 0.1 % nasal spray ?Commonly known as: ASTELIN ?PLACE 2 SPRAYS INTO BOTH NOSTRILS AT BEDTIME AS NEEDED FOR RHINITIS OR ALLERGIES. ?  ?dapagliflozin propanediol 10 MG Tabs tablet ?Commonly known as: FARXIGA ?Take 10 mg by mouth daily. ?  ?esomeprazole 40 MG capsule ?Commonly known as: NexIUM ?Take 1 capsule (40 mg total) by mouth daily. ?What changed: how much to take ?  ?eye wash Soln ?Place 1 drop into both  eyes at bedtime. [place] ?  ?ezetimibe 10 MG tablet ?Commonly known as: ZETIA ?TAKE 1 TABLET BY MOUTH EVERY DAY ?  ?fenofibrate 160 MG tablet ?TAKE 1 TABLET BY MOUTH EVERY DAY ?  ?folic acid 1 MG tablet ?Commonly known as: FOLVITE ?TAKE 2 TABLETS BY MOUTH EVERY DAY ?  ?freestyle lancets ?USE ONCE A DAY TO CHECK BLOOD SUGAR. ?  ?FREESTYLE LITE test strip ?Generic drug: glucose blood ?USE TO TEST BLOOD SUGAR ONCE A DAY. DX CODE: E11.9 ?  ?glimepiride 1 MG tablet ?Commonly known as: AMARYL ?TAKE 1 TABLET BY MOUTH EVERY DAY WITH BREAKFAST ?  ?icosapent Ethyl 1 g capsule ?Commonly known as: VASCEPA ?TAKE 2 CAPSULES BY MOUTH TWICE A DAY ?  ?ICY HOT ADVANCED PAIN  RELIEF EX ?Apply 1 application topically daily. Roll-on ?  ?Klor-Con M20 20 MEQ tablet ?Generic drug: potassium chloride SA ?TAKE 2 TABLETS (40 MEQ TOTAL) BY MOUTH 2 (TWO) TIMES DAILY. ?  ?metFORMIN 1000 MG tablet ?Commonly known as: GLUCOPHAGE ?TAKE 1 & 1/2 TABLETS BY MOUTH EVERY MORNING AND 1 TABLET IN THE EVENING. ?  ?metoprolol tartrate 25 MG tablet ?Commonly known as: LOPRESSOR ?TAKE ONE-HALF TABLET BY MOUTH TWICE DAILY ?  ?nitroGLYCERIN 0.4 MG SL tablet ?Commonly known as: NITROSTAT ?PLACE 1 TABLET (0.4 MG TOTAL) UNDER THE TONGUE EVERY 5 (FIVE) MINUTES AS NEEDED FOR CHEST PAIN. ?  ?predniSONE 10 MG tablet ?Commonly known as: DELTASONE ?TAKE 3 TABLETS PO QD FOR 3 DAYS THEN TAKE 2 TABLETS PO QD FOR 3 DAYS THEN TAKE 1 TABLET PO QD FOR 3 DAYS THEN TAKE 1/2 TAB PO QD FOR 3 DAYS ?  ?pregabalin 150 MG capsule ?Commonly known as: LYRICA ?Take 1 capsule (150 mg total) by mouth 2 (two) times daily. ?  ?psyllium 58.6 % powder ?Commonly known as: METAMUCIL ?Take 1 packet by mouth daily as needed (constipation). ?  ?torsemide 20 MG tablet ?Commonly known as: DEMADEX ?Take 20 mg by mouth daily. ?  ? ?  ? ? ?Allergies:  ?Allergies  ?Allergen Reactions  ? Benazepril Swelling  ?  angioedema; he is not a candidate for any angiotensin receptor blockers because of this significant allergic reaction. ?Because of a history of documented adverse serious drug reaction;Medi Alert bracelet  is recommended  ? Hctz [Hydrochlorothiazide] Anaphylaxis and Swelling  ?  Tongue and lip swelling ?  ? Benazepril Hcl Other (See Comments)  ? Aspirin Other (See Comments)  ?  Gastritis, cant take 325 Mg aspirin   ? Lactose Intolerance (Gi) Nausea And Vomiting  ? ? ?Past Medical History, Surgical history, Social history, and Family History were reviewed and updated. ? ?Review of Systems: ?All other 10 point review of systems is negative.  ? ?Physical Exam: ? vitals were not taken for this visit.  ? ?Wt Readings from Last 3 Encounters:  ?08/28/21 249  lb (112.9 kg)  ?07/27/21 252 lb (114.3 kg)  ?07/26/21 253 lb (114.8 kg)  ? ? ?Ocular: Sclerae unicteric, pupils equal, round and reactive to light ?Ear-nose-throat: Oropharynx clear, dentition fair ?Lymphatic: No cervical or supraclavicular adenopathy ?Lungs no rales or rhonchi, good excursion bilaterally ?Heart regular rate and rhythm, no murmur appreciated ?Abd soft, nontender, positive bowel sounds ?MSK no focal spinal tenderness, no joint edema ?Neuro: non-focal, well-oriented, appropriate affect ?Breasts: Deferred  ? ?Lab Results  ?Component Value Date  ? WBC 9.8 09/25/2021  ? HGB 12.3 (L) 09/25/2021  ? HCT 39.0 09/25/2021  ? MCV 89.7 09/25/2021  ? PLT 169  09/25/2021  ? ?Lab Results  ?Component Value Date  ? FERRITIN 1,126 (H) 09/25/2021  ? IRON 53 09/25/2021  ? TIBC 363 09/25/2021  ? UIBC 310 09/25/2021  ? IRONPCTSAT 15 (L) 09/25/2021  ? ?Lab Results  ?Component Value Date  ? RETICCTPCT 2.2 09/25/2021  ? RBC 4.36 09/25/2021  ? RETICCTABS 52.1 11/22/2011  ? ?No results found for: KPAFRELGTCHN, LAMBDASER, KAPLAMBRATIO ?Lab Results  ?Component Value Date  ? IGA 159 03/09/2012  ? ?Lab Results  ?Component Value Date  ? ALBUMINELP 4.2 07/27/2018  ? MSPIKE Not Observed 07/27/2018  ? ?  Chemistry   ?   ?Component Value Date/Time  ? NA 138 08/28/2021 1255  ? NA 136 03/16/2019 0938  ? NA 144 06/27/2017 0857  ? K 4.7 08/28/2021 1255  ? K 3.9 06/27/2017 0857  ? CL 100 08/28/2021 1255  ? CL 103 06/27/2017 0857  ? CO2 28 08/28/2021 1255  ? CO2 26 06/27/2017 0857  ? BUN 30 (H) 08/28/2021 1255  ? BUN 34 (H) 03/16/2019 8527  ? BUN 12 06/27/2017 0857  ? CREATININE 1.63 (H) 08/28/2021 1255  ? CREATININE 1.65 (H) 04/18/2020 0956  ?    ?Component Value Date/Time  ? CALCIUM 10.3 08/28/2021 1255  ? CALCIUM 9.2 06/27/2017 0857  ? ALKPHOS 56 08/28/2021 1255  ? ALKPHOS 128 (H) 06/27/2017 0857  ? AST 14 (L) 08/28/2021 1255  ? ALT 10 08/28/2021 1255  ? ALT 24 06/27/2017 0857  ? BILITOT 0.6 08/28/2021 1255  ?  ? ? ? ?Impression and Plan:  Richard [name] is a very pleasant 81 yo caucasian gentleman with diffuse large B-cell lymphoma (not "double hit" lymphoma). He completed treatment in February 2019.  ?B12 injection given.  ?Iron studies pendi

## 2021-10-26 NOTE — Patient Instructions (Signed)
Vitamin B12 and Folate Test Why am I having this test? Vitamin B12 and folate (folic acid) are B vitamins needed to make red blood cells and keep your nervous system healthy. Vitamin B12 is in foods such as meats, eggs, dairy products, and fish. Folate is in fruits, beans, and leafy green vegetables. Some foods, such as whole grains, bread, and cereals have vitamin B12 added to them (are fortified). You may not have enough of these B vitamins (have a deficiency) if your diet lacks these vitamins. Low levels can also be caused by diseases or having had surgeries on your stomach or small intestine that interfere with your ability to absorb the vitamins from your food. You may have a vitamin B12 and folate test if: You have symptoms of vitamin B12 or folate deficiency, such as tiredness (fatigue), headache, confusion, poor balance, or tingling and numbness in your hands and feet. You are pregnant or breastfeeding. Women who are pregnant or breastfeeding need more folate and may need to take dietary supplements. Your red blood cell count is low (anemia). You are an older person and have mental confusion. You have a disease or condition that may lead to a deficiency of these B vitamins. What is being tested? This test measures the amount of vitamin B12 and folate in your blood. The tests for vitamin B12 and folate may be done together or separately. What kind of sample is taken?  A blood sample is required for this test. It is usually collected by inserting a needle into a blood vessel. How do I prepare for this test? Follow instructions from your health care provider about eating and drinking before the test. Tell a health care provider about: All medicines you are taking, including vitamins, herbs, eye drops, creams, and over-the-counter medicines. Any medical conditions you have. Whether you are pregnant or may be pregnant. How often you drink alcohol. How are the results reported? Your test  results will be reported as values that identify the amount of vitamin B12 and folate in your blood. Your health care provider will compare your results to normal ranges that were established after testing a large group of people (reference ranges). Reference ranges may vary among labs and hospitals. For this test, common reference ranges are: Vitamin B12: 160-950 pg/mL or 118-701 pmol/L (SI units). Folate: 5-25 ng/mL or 11-57 nmol/L (SI units). What do the results mean? Results within the reference range are considered normal. Vitamin B12 or folate levels that are lower than the reference range may be caused by: Poor nutrition or eating a vegetarian or vegan diet that does not include any foods that come from animals. Having alcoholism. Having certain diseases that make it hard to absorb vitamin B12. These diseases include Crohn's disease, chronic pancreatitis, and cystic fibrosis. Taking certain medicines. Having had surgeries on your stomach or small intestine. High levels of vitamin B12 are rare, but they may happen if you have: Cancer. Liver disease. High levels of folate may happen if: You have anemia. You are vegetarian. You have had a recent blood transfusion. Talk with your health care provider about what your results mean. Questions to ask your health care provider Ask your health care provider, or the department that is doing the test: When will my results be ready? How will I get my results? What are my treatment options? What other tests do I need? What are my next steps? Summary Vitamin B12 and folate (folic acid) are both B vitamins that are needed to   make red blood cells and to keep your nervous system healthy. You may not have enough B vitamins in your body if you do not get enough in your diet or if you have a disease that makes it hard to absorb vitamin B12. This test measures the amount of vitamin B12 and folate in your blood. A blood sample is required for the  test. Talk with your health care provider about what your results mean. This information is not intended to replace advice given to you by your health care provider. Make sure you discuss any questions you have with your health care provider. Document Revised: 02/09/2021 Document Reviewed: 02/09/2021 Elsevier Patient Education  2023 Elsevier Inc. 

## 2021-11-01 ENCOUNTER — Telehealth: Payer: Self-pay | Admitting: *Deleted

## 2021-11-01 DIAGNOSIS — M545 Low back pain, unspecified: Secondary | ICD-10-CM | POA: Diagnosis not present

## 2021-11-01 NOTE — Telephone Encounter (Signed)
Per 10/26/21 los - called and lvm of upcoming appointments - requested call back to confirm ?

## 2021-11-02 ENCOUNTER — Telehealth: Payer: Self-pay | Admitting: Internal Medicine

## 2021-11-02 NOTE — Telephone Encounter (Signed)
I spoke with the patient and let him know that I did updated our system to show he has a Micra now. He is now being monitored by Dr. Remus Blake office at [facility]. ?

## 2021-11-02 NOTE — Telephone Encounter (Signed)
Patient said he now has a wireless pacemaker that was implanted by Dr. Remus Blake at [facility]. He has been sending transmissions to Dr. Remus Blake to monitor his pacemaker. ? ?The patient only has a cell phone now. If the office needs to reach him please call [phone] ?

## 2021-11-04 ENCOUNTER — Other Ambulatory Visit: Payer: Self-pay | Admitting: Family Medicine

## 2021-11-04 DIAGNOSIS — E781 Pure hyperglyceridemia: Secondary | ICD-10-CM

## 2021-11-06 DIAGNOSIS — I871 Compression of vein: Secondary | ICD-10-CM | POA: Diagnosis not present

## 2021-11-06 DIAGNOSIS — Z48812 Encounter for surgical aftercare following surgery on the circulatory system: Secondary | ICD-10-CM | POA: Diagnosis not present

## 2021-11-06 DIAGNOSIS — Z9861 Coronary angioplasty status: Secondary | ICD-10-CM | POA: Diagnosis not present

## 2021-11-06 DIAGNOSIS — M7989 Other specified soft tissue disorders: Secondary | ICD-10-CM | POA: Diagnosis not present

## 2021-11-06 DIAGNOSIS — Z950 Presence of cardiac pacemaker: Secondary | ICD-10-CM | POA: Diagnosis not present

## 2021-11-12 ENCOUNTER — Other Ambulatory Visit: Payer: Self-pay | Admitting: Family Medicine

## 2021-11-13 ENCOUNTER — Ambulatory Visit (INDEPENDENT_AMBULATORY_CARE_PROVIDER_SITE_OTHER): Payer: Medicare Other | Admitting: Dermatology

## 2021-11-13 DIAGNOSIS — L578 Other skin changes due to chronic exposure to nonionizing radiation: Secondary | ICD-10-CM | POA: Diagnosis not present

## 2021-11-13 DIAGNOSIS — I25810 Atherosclerosis of coronary artery bypass graft(s) without angina pectoris: Secondary | ICD-10-CM | POA: Diagnosis not present

## 2021-11-13 DIAGNOSIS — L570 Actinic keratosis: Secondary | ICD-10-CM

## 2021-11-13 DIAGNOSIS — D489 Neoplasm of uncertain behavior, unspecified: Secondary | ICD-10-CM

## 2021-11-13 NOTE — Patient Instructions (Addendum)
Actinic keratoses are precancerous spots that appear secondary to cumulative UV radiation exposure/sun exposure over time. They are chronic with expected duration over 1 year. A portion of actinic keratoses will progress to squamous cell carcinoma of the skin. It is not possible to reliably predict which spots will progress to skin cancer and so treatment is recommended to prevent development of skin cancer. ? ?Recommend daily broad spectrum sunscreen SPF 30+ to sun-exposed areas, reapply every 2 hours as needed.  ?Recommend staying in the shade or wearing long sleeves, sun glasses (UVA+UVB protection) and wide brim hats (4-inch brim around the entire circumference of the hat). ?Call for new or changing lesions.  ? ? ? ? ? ?If You Need Anything After Your Visit ? ?If you have any questions or concerns for your doctor, please call our main line at [phone] and press option 4 to reach your doctor's medical assistant. If no one answers, please leave a voicemail as directed and we will return your call as soon as possible. Messages left after 4 pm will be answered the following business day.  ? ?You may also send [place] a message via MyChart. We typically respond to MyChart messages within 1-2 business days. ? ?For prescription refills, please ask your pharmacy to contact our office. Our fax number is [phone]. ? ?If you have an urgent issue when the clinic is closed that cannot wait until the next business day, you can page your doctor at the number below.   ? ?Please note that while we do our best to be available for urgent issues outside of office hours, we are not available 24/7.  ? ?If you have an urgent issue and are unable to reach [place], you may choose to seek medical care at your doctor's office, retail clinic, urgent care center, or emergency room. ? ?If you have a medical emergency, please immediately call 911 or go to the emergency department. ? ?Pager Numbers ? ?- Dr. Nehemiah Massed: [phone] ? ?- Dr. Laurence Ferrari:  [phone] ? ?- Dr. Nicole Kindred: [phone] ? ?In the event of inclement weather, please call our main line at [phone] for an update on the status of any delays or closures. ? ?Dermatology Medication Tips: ?Please keep the boxes that topical medications come in in order to help keep track of the instructions about where and how to use these. Pharmacies typically print the medication instructions only on the boxes and not directly on the medication tubes.  ? ?If your medication is too expensive, please contact our office at [phone] option 4 or send [place] a message through [place].  ? ?We are unable to tell what your co-pay for medications will be in advance as this is different depending on your insurance coverage. However, we may be able to find a substitute medication at lower cost or fill out paperwork to get insurance to cover a needed medication.  ? ?If a prior authorization is required to get your medication covered by your insurance company, please allow [place] 1-2 business days to complete this process. ? ?Drug prices often vary depending on where the prescription is filled and some pharmacies may offer cheaper prices. ? ?The website www.goodrx.com contains coupons for medications through different pharmacies. The prices here do not account for what the cost may be with help from insurance (it may be cheaper with your insurance), but the website can give you the price if you did not use any insurance.  ?- You can print the associated coupon and take it with  your prescription to the pharmacy.  ?- You may also stop by our office during regular business hours and pick up a GoodRx coupon card.  ?- If you need your prescription sent electronically to a different pharmacy, notify our office through [facility] or by phone at [phone] option 4. ? ? ? ? ?Si Usted Necesita Algo Despu?s de Su Visita ? ?Tambi?n puede enviarnos un mensaje a trav?s de MyChart. Por lo general respondemos a los mensajes de  MyChart en el transcurso de 1 a 2 d?as h?biles. ? ?Para renovar recetas, por favor pida a su farmacia que se ponga en contacto con nuestra oficina. Nuestro n?mero de fax es el [phone]. ? ?Si tiene un asunto urgente cuando la cl?nica est? cerrada y que no puede esperar hasta el siguiente d?a h?bil, puede llamar/localizar a su doctor(a) al n?mero que aparece a continuaci?n.  ? ?Por favor, tenga en cuenta que aunque hacemos todo lo posible para estar disponibles para asuntos urgentes fuera del horario de oficina, no estamos disponibles las 24 horas del d?a, los 7 d?as de la semana.  ? ?Si tiene un problema urgente y no puede comunicarse con nosotros, puede optar por buscar atenci?n m?dica  en el consultorio de su doctor(a), en una cl?nica privada, en un centro de atenci?n urgente o en una sala de emergencias. ? ?Si tiene Engineer, maintenance (IT) m?dica, por favor llame inmediatamente al 911 o vaya a la sala de emergencias. ? ?N?meros de b?per ? ?- Dr. Nehemiah Massed: [phone] ? ?- Dra. Moye: [phone] ? ?- Dra. Nicole Kindred: [phone] ? ?En caso de inclemencias del tiempo, por favor llame a nuestra l?nea principal al [phone] para una actualizaci?n sobre el estado de cualquier retraso o cierre. ? ?Consejos para la medicaci?n en dermatolog?a: ?Por favor, guarde las cajas en las que vienen los medicamentos de uso t?pico para ayudarle a seguir las instrucciones sobre d?nde y c?mo usarlos. Las farmacias generalmente imprimen las instrucciones del medicamento s?lo en las cajas y no directamente en los tubos del [place].  ? ?Si su medicamento es muy caro, por favor, p?ngase en contacto con Zigmund Daniel llamando al [phone] y presione la opci?n 4 o env?enos un mensaje a trav?s de MyChart.  ? ?No podemos decirle cu?l ser? su copago por los medicamentos por adelantado ya que esto es diferente dependiendo de la cobertura de su seguro. Sin embargo, es posible que podamos encontrar un medicamento sustituto a Contractor un formulario para que el seguro cubra el medicamento que se considera necesario.  ? ?Si se requiere Ardelia Mems autorizaci?n previa para que su compa??a de seguros [place] su medicamento, por favor perm?tanos de 1 a 2 d?as h?biles para completar este proceso. ? ?Los precios de los medicamentos var?an con frecuencia dependiendo del Environmental consultant de d?nde se surte la receta y alguna farmacias pueden ofrecer precios m?s baratos. ? ?El sitio web www.goodrx.com tiene cupones para medicamentos de Airline pilot. Los precios aqu? no tienen en cuenta lo que podr?a costar con la ayuda del seguro (puede ser m?s barato con su seguro), pero el sitio web puede darle el precio si no utiliz? ning?n seguro.  ?- Puede imprimir el cup?n correspondiente y llevarlo con su receta a la farmacia.  ?- Tambi?n puede pasar por nuestra oficina durante el horario de atenci?n regular y recoger una tarjeta de cupones de GoodRx.  ?- Si necesita que su receta se env?e electr?nicamente a Chiropodist, informe a nuestra oficina a trav?s [place]  o por tel?fono llamando al [phone] y presione la opci?n 4. ? ?

## 2021-11-13 NOTE — Progress Notes (Signed)
? ?  Follow-Up Visit ?  ?Subjective  ?Richard [name] is a 81 y.o. male who presents for the following: Actinic Keratosis (6 month follow up, hx of aks at face, scalp, ears, and arms. Discussed treating arms and face with PDT at last follow up. Patient reports unable to do PDT due to patient's recent health. /Has several spots he would like checked at face and arms today. ).  He has a surgical procedure for a stent placement scheduled next week to help his swollen arm. ? ? ?The patient has spots, moles and lesions to be evaluated, some may be new or changing and the patient has concerns that these could be cancer. ? ? ?The following portions of the chart were reviewed this encounter and updated as appropriate:   ?  ? ?Review of Systems: No other skin or systemic complaints except as noted in HPI or Assessment and Plan. ? ? ?Objective  ?Well appearing patient in no apparent distress; mood and affect are within normal limits. ? ?A focused examination was performed including face, arms, neck, ears, . Relevant physical exam findings are noted in the Assessment and Plan. ? ?face and arms ?Multiple hyperkeratotic pink papules at face and arms  ? ?right lateral cheek ?6 mm pink crusted papule  ? ?rt postauricular neck ?5 mm pink crusted papule  ? ? ?Assessment & Plan  ?Actinic keratosis ?face and arms ? ?Multiple hyperkertrophic aks at face and arms will hold treatment today due to patient health and upcoming surgery.  Will plan to treat areas at next follow up ? ?Actinic keratoses are precancerous spots that appear secondary to cumulative UV radiation exposure/sun exposure over time. They are chronic with expected duration over 1 year. A portion of actinic keratoses will progress to squamous cell carcinoma of the skin. It is not possible to reliably predict which spots will progress to skin cancer and so treatment is recommended to prevent development of skin cancer. ? ?Recommend daily broad spectrum sunscreen SPF 30+ to  sun-exposed areas, reapply every 2 hours as needed.  ?Recommend staying in the shade or wearing long sleeves, sun glasses (UVA+UVB protection) and wide brim hats (4-inch brim around the entire circumference of the hat). ?Call for new or changing lesions. ? ?Neoplasm of uncertain behavior (2) ?right lateral cheek ? ?rt postauricular neck ? ?Will hold off on treatment today due to patient's health and upcoming surgery.  ? ?Will plan shave bx at next follow up for area at right lateral cheek and right postauricular neck r/o BCC/SCC  ? ? ? ? ?Actinic Damage ?- chronic, secondary to cumulative UV radiation exposure/sun exposure over time ?- diffuse scaly erythematous macules with underlying dyspigmentation ?- Recommend daily broad spectrum sunscreen SPF 30+ to sun-exposed areas, reapply every 2 hours as needed.  ?- Recommend staying in the shade or wearing long sleeves, sun glasses (UVA+UVB protection) and wide brim hats (4-inch brim around the entire circumference of the hat). ?- Call for new or changing lesions.  ? ?Return for 4 - 6 week ak follow up and bx . ?I, Ruthell Rummage, CMA, am acting as scribe for Brendolyn Patty, MD. ? ?Documentation: I have reviewed the above documentation for accuracy and completeness, and I agree with the above. ? ?Brendolyn Patty MD  ? ?

## 2021-11-16 ENCOUNTER — Encounter: Payer: Self-pay | Admitting: Family Medicine

## 2021-11-16 ENCOUNTER — Ambulatory Visit (INDEPENDENT_AMBULATORY_CARE_PROVIDER_SITE_OTHER): Payer: Medicare Other | Admitting: Family Medicine

## 2021-11-16 VITALS — BP 120/68 | HR 75 | Temp 97.7°F | Resp 20 | Ht 72.0 in | Wt 254.0 lb

## 2021-11-16 DIAGNOSIS — R051 Acute cough: Secondary | ICD-10-CM

## 2021-11-16 DIAGNOSIS — I442 Atrioventricular block, complete: Secondary | ICD-10-CM | POA: Diagnosis not present

## 2021-11-16 DIAGNOSIS — Z45018 Encounter for adjustment and management of other part of cardiac pacemaker: Secondary | ICD-10-CM | POA: Diagnosis not present

## 2021-11-16 DIAGNOSIS — J020 Streptococcal pharyngitis: Secondary | ICD-10-CM

## 2021-11-16 DIAGNOSIS — I25810 Atherosclerosis of coronary artery bypass graft(s) without angina pectoris: Secondary | ICD-10-CM

## 2021-11-16 MED ORDER — AMOXICILLIN 875 MG PO TABS: 875.0000 mg | ORAL_TABLET | Freq: Two times a day (BID) | ORAL | 0 refills | Status: AC

## 2021-11-16 NOTE — Assessment & Plan Note (Signed)
Amoxicillin 875 bid x 10 days  Drink plenty of fluids and tylenol for pain  rto if no improvement

## 2021-11-16 NOTE — Patient Instructions (Signed)
Strep Throat, Adult Strep throat is an infection in the throat that is caused by bacteria. It is common during the cold months of the year. It mostly affects children who are 5-83 years old. However, people of all ages can get it at any time of the year. This infection spreads from person to person (is contagious) through coughing, sneezing, or having close contact. Your health care provider may use other names to describe the infection. When strep throat affects the tonsils, it is called tonsillitis. When it affects the back of the throat, it is called pharyngitis. What are the causes? This condition is caused by the Streptococcus pyogenes bacteria. What increases the risk? You are more likely to develop this condition if: You care for school-age children, or are around school-age children. Children are more likely to get strep throat and may spread it to others. You spend time in crowded places where the infection can spread easily. You have close contact with someone who has strep throat. What are the signs or symptoms? Symptoms of this condition include: Fever or chills. Redness, swelling, or pain in the tonsils or throat. Pain or difficulty when swallowing. White or yellow spots on the tonsils or throat. Tender glands in the neck and under the jaw. Bad smelling breath. Red rash all over the body. This is rare. How is this diagnosed? This condition is diagnosed by tests that check for the presence and the amount of bacteria that cause strep throat. They are: Rapid strep test. Your throat is swabbed and checked for the presence of bacteria. Results are usually ready in minutes. Throat culture test. Your throat is swabbed. The sample is placed in a cup that allows infections to grow. Results are usually ready in 1 or 2 days. How is this treated? This condition may be treated with: Medicines that kill germs (antibiotics). Medicines that relieve pain or fever. These include: Ibuprofen or  acetaminophen. Aspirin, only for people who are over the age of 18. Throat lozenges. Throat sprays. Follow these instructions at home: Medicines  Take over-the-counter and prescription medicines only as told by your health care provider. Take your antibiotic medicine as told by your health care provider. Do not stop taking the antibiotic even if you start to feel better. Eating and drinking  If you have trouble swallowing, try eating soft foods until your sore throat feels better. Drink enough fluid to keep your urine pale yellow. To help relieve pain, you may have: Warm fluids, such as soup and tea. Cold fluids, such as frozen desserts or popsicles. General instructions Gargle with a salt-water mixture 3-4 times a day or as needed. To make a salt-water mixture, completely dissolve -1 tsp (3-6 g) of salt in 1 cup (237 mL) of warm water. Get plenty of rest. Stay home from work or school until you have been taking antibiotics for 24 hours. Do not use any products that contain nicotine or tobacco. These products include cigarettes, chewing tobacco, and vaping devices, such as e-cigarettes. If you need help quitting, ask your health care provider. It is up to you to get your test results. Ask your health care provider, or the department that is doing the test, when your results will be ready. Keep all follow-up visits. This is important. How is this prevented?  Do not share food, drinking cups, or personal items that could cause the infection to spread to other people. Wash your hands often with soap and water for at least 20 seconds. If soap   and water are not available, use hand sanitizer. Make sure that all people in your house wash their hands well. Have family members tested if they have a sore throat or fever. They may need an antibiotic if they have strep throat. Contact a health care provider if: You have swelling in your neck that keeps getting bigger. You develop a rash, cough, or  earache. You cough up a thick mucus that is green, yellow-brown, or bloody. You have pain or discomfort that does not get better with medicine. Your symptoms seem to be getting worse. You have a fever. Get help right away if: You have new symptoms, such as vomiting, severe headache, stiff or painful neck, chest pain, or shortness of breath. You have severe throat pain, drooling, or changes in your voice. You have swelling of the neck, or the skin on the neck becomes red and tender. You have signs of dehydration, such as tiredness (fatigue), dry mouth, and decreased urination. You become increasingly sleepy, or you cannot wake up completely. Your joints become red or painful. These symptoms may represent a serious problem that is an emergency. Do not wait to see if the symptoms will go away. Get medical help right away. Call your local emergency services (911 in the [place] Do not drive yourself to the hospital. Summary Strep throat is an infection in the throat that is caused by the Streptococcus pyogenes bacteria. This infection is spread from person to person (is contagious) through coughing, sneezing, or having close contact. Take your medicines, including antibiotics, as told by your health care provider. Do not stop taking the antibiotic even if you start to feel better. To prevent the spread of germs, wash your hands well with soap and water. Have others do the same. Do not share food, drinking cups, or personal items. Get help right away if you have new symptoms, such as vomiting, severe headache, stiff or painful neck, chest pain, or shortness of breath. This information is not intended to replace advice given to you by your health care provider. Make sure you discuss any questions you have with your health care provider. Document Revised: 10/10/2020 Document Reviewed: 10/10/2020 Elsevier Patient Education  2023 [employer]  

## 2021-11-16 NOTE — Progress Notes (Signed)
Established Patient Office Visit  Subjective   Patient ID: Richard [name], male    DOB: 17-Dec-1940  Age: 81 y.o. MRN: [id number]  Chief Complaint  Patient presents with   Sore Throat    Pt states having a sore throat x2 weeks. Pt states coughing up blood and had some blood come out his nose. Pain with swallowing     HPI Pt here c/o sore throat for 2 weeks.   No fever , + cough  Patient Active Problem List   Diagnosis Date Noted   Strep throat 11/16/2021   Multiple superficial wounds with infection 01/04/2021   Cardiac pacemaker in situ 11/20/2020   S/P CABG x 4 11/20/2020   Ascending aortic aneurysm ([place]) 11/20/2020   Senile purpura ([place]) 07/27/2020   Porokeratosis 06/20/2020   Pain due to onychomycosis of toenails of both feet 12/14/2019   Educated about COVID-19 virus infection 11/25/2019   Uncontrolled type 2 diabetes mellitus with hypoglycemia without coma ([place]) 08/10/2019   Hyperlipidemia associated with type 2 diabetes mellitus ([place]) 08/10/2019   S/P left TKA 06/29/2019   Peripheral neuropathy 07/27/2018   Uncontrolled type 2 diabetes mellitus with hyperglycemia ([place]) 05/13/2018   Diabetic peripheral neuropathy associated with type 2 diabetes mellitus ([place]) 05/13/2018   Pansinusitis 05/13/2018   Severe aortic stenosis    Complete heart block (HCC)    Paroxysmal atrial fibrillation (HCC)    Acute on chronic diastolic CHF (congestive heart failure), NYHA class 2 (HCC)    Left arm swelling    Status post transcatheter aortic valve replacement (TAVR) using bioprosthesis 09/09/2017   Demand ischemia of myocardium (HCC)    Typical atrial flutter (HCC)    Acute on chronic diastolic heart failure (HCC)    GIB (gastrointestinal bleeding) 08/16/2017   Hyperlipidemia 08/05/2017   NSTEMI (non-ST elevated myocardial infarction) ([place])    Aortic stenosis, severe 07/25/2017   Pancytopenia ([place]) 07/24/2017   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 ([place])  02/27/2017   Bradycardia 01/04/2017   Coronary artery disease 01/04/2017   Stenosis of carotid artery 01/04/2017   Obesity 09/18/2016   Wenckebach block    Disorder associated with type II diabetes mellitus ([place]) 05/21/2013   Spinal stenosis of lumbar region 04/08/2013   Anemia, iron deficiency 11/29/2011   B12 deficiency anemia 07/30/2011   OSA (obstructive sleep apnea) 07/06/2010   CAD, ARTERY BYPASS GRAFT 08/22/2009   Essential hypertension 03/29/2009   Bilateral lower extremity edema 02/21/2009   Hyperlipidemia LDL goal <70 11/26/2008   Past Medical History:  Diagnosis Date   Anemia    Arthritis    hips   Axillary adenopathy 02/25/2017   Bradycardia    a. holter monitor has demonstrated HRs in 65s and [place]    CAD (coronary artery disease)    a. s/p CABG 2001  b.  07/28/2017 cath:   Severe three-vessel native CAD with total occlusion of LAD, ramus intermedius, first OM and RCA, patent RIMA to PDA, LIMA to LAD, sequential SVG to ramus intermedius and first OM.     Chronic lower back pain    Diffuse large B cell lymphoma (HCC)    Diverticulosis    Esophageal stricture    GERD (gastroesophageal reflux disease)    History of gout    HTN (hypertension)    Mixed hyperlipidemia    OSA on CPAP    with 2L O2 at night   Pancytopenia ([place])    a. related to chemo therapy for B  cell lymphoma   Peptic stricture of esophagus    Presence of permanent cardiac pacemaker    sees Dr. Valaria Good pacemaker   SCC (squamous cell carcinoma) 01/31/2021   R zygoma, ED[facility]   SCC (squamous cell carcinoma) 01/31/2021   L post ankle, ED[facility]   SCC (squamous cell carcinoma) 01/31/2021   L popliteal, EDC   Severe aortic stenosis    Spinal stenosis    Squamous cell carcinoma of skin 03/22/2009   Right mandible. SCCis, hypertrophic.    Type II diabetes mellitus ([place])    Wears dentures    partial upper   Past Surgical History:  Procedure Laterality Date   APPENDECTOMY  ~ [place] Left 11/06/2016   Procedure: CATARACT EXTRACTION PHACO AND INTRAOCULAR LENS PLACEMENT (IOC);  Surgeon: Estill Cotta, MD;  Location: [facility];  Service: Ophthalmology;  Laterality: Left;  Lot # [id number] [place]: 01:09.4 AP%:25.2 CDE: 30.64   CATARACT EXTRACTION W/PHACO Right 12/04/2016   Procedure: CATARACT EXTRACTION PHACO AND INTRAOCULAR LENS PLACEMENT (IOC);  Surgeon: Estill Cotta, MD;  Location: [facility];  Service: Ophthalmology;  Laterality: Right;  [place] 1:25.9 AP% 24.1 CDE 39.10 Fluid Pack lot # [id number]   COLONOSCOPY W/ BIOPSIES AND POLYPECTOMY  07/02/2011   CORONARY ANGIOPLASTY  07/02/1991   CORONARY ANGIOPLASTY WITH STENT PLACEMENT  05/31/1997   "1"   CORONARY ARTERY BYPASS GRAFT  03/01/2000   "CABG X5"   ECTROPION REPAIR Right 09/01/2018   Procedure: REPAIR OF ECTROPION BILATERAL upper and lower;  Surgeon: Karle Starch, MD;  Location: [place];  Service: Ophthalmology;  Laterality: Right;  Diabetic - oral meds sleep apnea   ESOPHAGEAL DILATION  X 3-4   Dr. Lyla Son; "last one was in the 1990's"   ESOPHAGOGASTRODUODENOSCOPY     multiple   FLEXIBLE SIGMOIDOSCOPY     multiple   HYDRADENITIS EXCISION Left 02/25/2017   Procedure: EXCISION DEEP LEFT AXILLARY LYMPH NODE;  Surgeon: Fanny Skates, MD;  Location: [place];  Service: General;  Laterality: Left;   INTRAOPERATIVE TRANSTHORACIC ECHOCARDIOGRAM N/A 09/09/2017   Procedure: INTRAOPERATIVE TRANSTHORACIC ECHOCARDIOGRAM;  Surgeon: Burnell Blanks, MD;  Location: [place];  Service: Open Heart Surgery;  Laterality: N/A;   KNEE ARTHROSCOPY Left 07/01/2009   meniscus repair   LEFT HEART CATHETERIZATION WITH CORONARY/GRAFT ANGIOGRAM N/A 03/16/2014   Procedure: LEFT HEART CATHETERIZATION WITH Beatrix Fetters;  Surgeon: Burnell Blanks, MD;  [facility] CATH [facility];  Service: Cardiovascular;  Laterality: N/A;   LUMBAR LAMINECTOMY/DECOMPRESSION  MICRODISCECTOMY Right 06/17/2013   Procedure: LUMBAR LAMINECTOMY MICRODISCECTOMY L4-L5 RIGHT EXCISION OF SYNOVIAL CYST RIGHT   (1 LEVEL) RIGHT PARTIAL FACETECTOMY;  Surgeon: Tobi Bastos, MD;  Location: [facility];  Service: Orthopedics;  Laterality: Right;   MYELOGRAM  04/06/2013   lumbar, Dr Gladstone Lighter   ORBITAL LESION EXCISION Right 09/01/2018   Procedure: ORBITOTOMY WITHOUT BONE FLAP WITH REMOVAL OF LESION RIGHT;  Surgeon: Karle Starch, MD;  Location: [place];  Service: Ophthalmology;  Laterality: Right;   PACEMAKER IMPLANT N/A 09/10/2017   Procedure: PACEMAKER IMPLANT;  Surgeon: Deboraha Sprang, MD;  Location: [place] [facility] LAB;  Service: Cardiovascular;  Laterality: N/A;   PANENDOSCOPY     PERIPHERAL VASCULAR BALLOON ANGIOPLASTY Left 03/16/2021   Procedure: PERIPHERAL VASCULAR BALLOON ANGIOPLASTY;  Surgeon: [name], MD;  Location: [place] CV LAB;  Service: Cardiovascular;  Laterality: Left;  subclavian  vein   PORTACATH PLACEMENT N/A 03/06/2017  Procedure: INSERTION PORT-A-CATH AND ASPIRATE SEROMA LEFT AXILLA;  Surgeon: Fanny Skates, MD;  Location: [place];  Service: General;  Laterality: N/A;   RIGHT/LEFT HEART CATH AND CORONARY/GRAFT ANGIOGRAPHY N/A 07/28/2017   Procedure: RIGHT/LEFT HEART CATH AND CORONARY/GRAFT ANGIOGRAPHY;  Surgeon: Sherren Mocha, MD;  Location: [place] [facility];  Service: Cardiovascular;  Laterality: N/A;   SHOULDER SURGERY Right 08/30/2010   screws placed; "tendons tore off"   SKIN CANCER EXCISION Right    "neck"   TONSILLECTOMY  ~ [place] Left 06/29/2019   Procedure: TOTAL KNEE ARTHROPLASTY;  Surgeon: Paralee Cancel, MD;  Location: [facility];  Service: Orthopedics;  Laterality: Left;  70 mins   TRANSCATHETER AORTIC VALVE REPLACEMENT, TRANSFEMORAL N/A 09/09/2017   Procedure: TRANSCATHETER AORTIC VALVE REPLACEMENT, TRANSFEMORAL;  Surgeon: Burnell Blanks, MD;  Location: [place];  Service: Open Heart Surgery;   Laterality: N/A;   UPPER EXTREMITY VENOGRAPHY Left 02/15/2021   Procedure: CENTRAL VENO;  Surgeon: [name], MD;  Location: [place] [facility];  Service: Cardiovascular;  Laterality: Left;   UPPER EXTREMITY VENOGRAPHY N/A 03/16/2021   Procedure: UPPER EXTREMITY VENOGRAPHY;  Surgeon: [name], MD;  Location: [place] CV LAB;  Service: Cardiovascular;  Laterality: N/A;   UPPER GI ENDOSCOPY  07/02/2011   Gastritis; Dr Carlean Purl   VASECTOMY     wireless pacemaker placed     Social History   Tobacco Use   Smoking status: Never   Smokeless tobacco: Never  Vaping Use   Vaping Use: Never used  Substance Use Topics   Alcohol use: No    Alcohol/week: 0.0 standard drinks    Comment: "last drink was in 2012"( 03/15/2014)   Drug use: No   Social History   Socioeconomic History   Marital status: Divorced    Spouse name: Not on file   Number of children: 2   Years of education: college   Highest education level: Not on file  Occupational History    Employer: RETIRED  Tobacco Use   Smoking status: Never   Smokeless tobacco: Never  Vaping Use   Vaping Use: Never used  Substance and Sexual Activity   Alcohol use: No    Alcohol/week: 0.0 standard drinks    Comment: "last drink was in 2012"( 03/15/2014)   Drug use: No   Sexual activity: Not Currently  Other Topics Concern   Not on file  Social History Narrative   ** Merged History Encounter **       Divorced, lives with a roommate. 1 son one daughter 3-4 caffeinated beverages daily Right-handed. 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   Social Determinants of Health   Financial Resource Strain: Low Risk    Difficulty of Paying Living Expenses: Not very hard  Food Insecurity: No Food Insecurity   Worried About Charity fundraiser in the Last Year: Never true   Ran Out of Food in the Last  Year: Never true  Transportation Needs: No Transportation Needs   Lack of Transportation (Medical): No   Lack of Transportation (Non-Medical): No  Physical Activity: Sufficiently Active   Days of Exercise per Week: 5 days   Minutes of Exercise per Session: 30 min  Stress: Not on file  Social Connections: Moderately Isolated   Frequency of Communication with Friends and Family: More than three times a  week   Frequency of Social Gatherings with Friends and Family: More than three times a week   Attends Religious Services: More than 4 times per year   Active Member of [employer] or Organizations: No   Attends Music therapist: Never   Marital Status: Divorced  Human resources officer Violence: Not At Risk   Fear of Current or Ex-Partner: No   Emotionally Abused: No   Physically Abused: No   Sexually Abused: No   Family Status  Relation Name Status   Father  Deceased   Mother  Deceased   MGM  Deceased   PGM  Deceased   MGF  Deceased   PGF  Deceased   Neg Hx  (Not Specified)   Family History  Problem Relation Age of Onset   Stroke Father    Hypertension Father    Pancreatic cancer Mother    Diabetes Maternal Grandmother    Stroke Maternal Grandmother    Heart attack Paternal Grandmother    Colon cancer Neg Hx    Esophageal cancer Neg Hx    Rectal cancer Neg Hx    Stomach cancer Neg Hx    Ulcers Neg Hx    Allergies  Allergen Reactions   Benazepril Swelling    angioedema; he is not a candidate for any angiotensin receptor blockers because of this significant allergic reaction. Because of a history of documented adverse serious drug reaction;Medi Alert bracelet  is recommended   Hctz [Hydrochlorothiazide] Anaphylaxis and Swelling    Tongue and lip swelling    Benazepril Hcl Other (See Comments)   Aspirin Other (See Comments)    Gastritis, cant take 325 Mg aspirin    Lactose Intolerance (Gi) Nausea And Vomiting      Review of Systems  Constitutional:  Negative for  fever and malaise/fatigue.  HENT:  Positive for sore throat. Negative for congestion.   Eyes:  Negative for blurred vision.  Respiratory:  Negative for cough and shortness of breath.   Cardiovascular:  Negative for chest pain, palpitations and leg swelling.  Gastrointestinal:  Negative for vomiting.  Musculoskeletal:  Negative for back pain.  Skin:  Negative for rash.  Neurological:  Negative for loss of consciousness and headaches.     Objective:     BP 120/68 (BP Location: Right Arm, Patient Position: Sitting, Cuff Size: Large)   Pulse 75   Temp 97.7 F (36.5 C) (Oral)   Resp 20   Ht 6' (1.829 m)   Wt 254 lb (115.2 kg)   SpO2 96%   BMI 34.45 kg/m  BP Readings from Last 3 Encounters:  11/16/21 120/68  10/26/21 (!) 109/52  10/09/21 122/62   Wt Readings from Last 3 Encounters:  11/16/21 254 lb (115.2 kg)  10/26/21 251 lb (113.9 kg)  08/28/21 249 lb (112.9 kg)   SpO2 Readings from Last 3 Encounters:  11/16/21 96%  10/26/21 96%  10/09/21 96%      Physical Exam Vitals and nursing note reviewed.  Constitutional:      Appearance: He is well-developed.  HENT:     Head: Normocephalic and atraumatic.     Mouth/Throat:     Pharynx: Pharyngeal swelling and posterior oropharyngeal erythema present. No oropharyngeal exudate.  Eyes:     Pupils: Pupils are equal, round, and reactive to light.  Neck:     Thyroid: No thyromegaly.  Cardiovascular:     Rate and Rhythm: Normal rate and regular rhythm.     Heart sounds: No murmur heard.  Pulmonary:     Effort: Pulmonary effort is normal. No respiratory distress.     Breath sounds: Normal breath sounds. No wheezing or rales.  Chest:     Chest wall: No tenderness.  Musculoskeletal:        General: No tenderness.     Cervical back: Normal range of motion and neck supple.  Skin:    General: Skin is warm and dry.  Neurological:     Mental Status: He is alert and oriented to person, place, and time.  Psychiatric:         Behavior: Behavior normal.        Thought Content: Thought content normal.        Judgment: Judgment normal.     No results found for any visits on 11/16/21.  Last CBC Lab Results  Component Value Date   WBC 7.8 10/26/2021   HGB 12.6 (L) 10/26/2021   HCT 40.3 10/26/2021   MCV 91.6 10/26/2021   MCH 28.6 10/26/2021   RDW 17.4 (H) 10/26/2021   PLT 169 59/56/3875   Last metabolic panel Lab Results  Component Value Date   GLUCOSE 115 (H) 08/28/2021   NA 138 08/28/2021   K 4.7 08/28/2021   CL 100 08/28/2021   CO2 28 08/28/2021   BUN 30 (H) 08/28/2021   CREATININE 1.63 (H) 08/28/2021   GFRNONAA 42 (L) 08/28/2021   CALCIUM 10.3 08/28/2021   PHOS 3.0 08/17/2017   PROT 6.9 08/28/2021   ALBUMIN 4.3 08/28/2021   LABGLOB 2.8 07/27/2018   AGRATIO 1.5 07/27/2018   BILITOT 0.6 08/28/2021   ALKPHOS 56 08/28/2021   AST 14 (L) 08/28/2021   ALT 10 08/28/2021   ANIONGAP 10 08/28/2021      The ASCVD Risk score (Arnett DK, et al., 2019) failed to calculate for the following reasons:   The 2019 ASCVD risk score is only valid for ages 74 to 9   The patient has a prior MI or stroke diagnosis   Poc strep--- Positive  Assessment & Plan:   Problem List Items Addressed This Visit       Unprioritized   Strep throat - Primary    Amoxicillin 875 bid x 10 days  Drink plenty of fluids and tylenol for pain  rto if no improvement        Relevant Medications   amoxicillin (AMOXIL) 875 MG tablet   Other Visit Diagnoses     Acute cough           Return if symptoms worsen or fail to improve.    [name], DO

## 2021-11-22 NOTE — Progress Notes (Signed)
Cardiology Office Note   Date:  11/23/2021   ID:  Richard [name], DOB 21-Apr-1941, MRN [id number]  PCP:  Carollee Herter, Alferd Apa, DO  Cardiologist:   Minus Breeding, MD  Chief Complaint  Patient presents with   Left arm swelling      History of Present Illness: Richard [name] is a 81 y.o. male who presents for follow up of CAD and TAVR.  He has had atrial flutter and heart block as well necessitating a pacemaker.   He has had continued problems with triglycerides.  He has some renal insufficiency.   Since I last saw him he had a leadless pacemaker placed.   This was in response to subclavian stenosis associated with a transvenous pacemaker.   He still has chronic thrombosis and apparently is going to have stenting of this soon.  He has continued left arm swelling.  He also was in the hospital since I saw him at [facility] in [place] with acute sepsis.  I reviewed these records.  The etiology was not clear.  He did recover from this.  He presented with altered mental status and loss of consciousness.  He says he is actually been doing well.  He is golfing.  He denies any cardiovascular symptoms.  He is not having any new shortness of breath, PND or orthopnea.  He is not having any new palpitations, presyncope or syncope.  He denies any chest pressure, neck or arm discomfort.  Aside from the upper extremity he has had no new edema.  He has some chronic lower extremity swelling.  He does think he has a little less energy than he did doing things like golfing.  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  Past Medical History:  Diagnosis Date   Anemia    Arthritis    hips   Axillary adenopathy 02/25/2017   Bradycardia    a. holter monitor has demonstrated HRs in 30s and [place]    CAD (coronary artery disease)    a. s/p CABG 2001  b.  07/28/2017 cath:   Severe three-vessel native CAD with total occlusion of LAD, ramus intermedius, first OM and RCA, patent RIMA to PDA, LIMA  to LAD, sequential SVG to ramus intermedius and first OM.     Chronic lower back pain    Diffuse large B cell lymphoma (HCC)    Diverticulosis    Esophageal stricture    GERD (gastroesophageal reflux disease)    History of gout    HTN (hypertension)    Mixed hyperlipidemia    OSA on CPAP    with 2L O2 at night   Pancytopenia ([place])    a. related to chemo therapy for B cell lymphoma   Peptic stricture of esophagus    Presence of permanent cardiac pacemaker    sees Dr. Valaria Good pacemaker   SCC (squamous cell carcinoma) 01/31/2021   R zygoma, [facility]   SCC (squamous cell carcinoma) 01/31/2021   L post ankle, [facility]   SCC (squamous cell carcinoma) 01/31/2021   L popliteal, ED[facility]   Severe aortic stenosis    Spinal stenosis    Squamous cell carcinoma of skin 03/22/2009   Right mandible. SCCis, hypertrophic.    Type II diabetes mellitus ([place])    Wears dentures    partial upper    Past Surgical History:  Procedure Laterality Date   APPENDECTOMY  ~ [place]  Left 11/06/2016   Procedure: CATARACT EXTRACTION PHACO AND INTRAOCULAR LENS PLACEMENT (IOC);  Surgeon: Estill Cotta, MD;  Location: [facility];  Service: Ophthalmology;  Laterality: Left;  Lot # [id number] H [place]: 01:09.4 AP%:25.2 CDE: 30.64   CATARACT EXTRACTION W/PHACO Right 12/04/2016   Procedure: CATARACT EXTRACTION PHACO AND INTRAOCULAR LENS PLACEMENT (IOC);  Surgeon: Estill Cotta, MD;  Location: [facility];  Service: Ophthalmology;  Laterality: Right;  [place] 1:25.9 AP% 24.1 CDE 39.10 Fluid Pack lot # [id number]   COLONOSCOPY W/ BIOPSIES AND POLYPECTOMY  07/02/2011   CORONARY ANGIOPLASTY  07/02/1991   CORONARY ANGIOPLASTY WITH STENT PLACEMENT  05/31/1997   "1"   CORONARY ARTERY BYPASS GRAFT  03/01/2000   "CABG X5"   ECTROPION REPAIR Right 09/01/2018   Procedure: REPAIR OF ECTROPION BILATERAL upper and lower;  Surgeon: Karle Starch, MD;  Location: [facility];  Service:  Ophthalmology;  Laterality: Right;  Diabetic - oral meds sleep apnea   ESOPHAGEAL DILATION  X 3-4   Dr. Lyla Son; "last one was in the 1990's"   ESOPHAGOGASTRODUODENOSCOPY     multiple   FLEXIBLE SIGMOIDOSCOPY     multiple   HYDRADENITIS EXCISION Left 02/25/2017   Procedure: EXCISION DEEP LEFT AXILLARY LYMPH NODE;  Surgeon: Fanny Skates, MD;  Location: [facility];  Service: General;  Laterality: Left;   INTRAOPERATIVE TRANSTHORACIC ECHOCARDIOGRAM N/A 09/09/2017   Procedure: INTRAOPERATIVE TRANSTHORACIC ECHOCARDIOGRAM;  Surgeon: Burnell Blanks, MD;  Location: [place] [facility];  Service: Open Heart Surgery;  Laterality: N/A;   KNEE ARTHROSCOPY Left 07/01/2009   meniscus repair   LEFT HEART CATHETERIZATION WITH CORONARY/GRAFT ANGIOGRAM N/A 03/16/2014   Procedure: LEFT HEART CATHETERIZATION WITH Beatrix Fetters;  Surgeon: Burnell Blanks, MD;  Location: [facility] CATH LAB;  Service: Cardiovascular;  Laterality: N/A;   LUMBAR LAMINECTOMY/DECOMPRESSION MICRODISCECTOMY Right 06/17/2013   Procedure: LUMBAR LAMINECTOMY MICRODISCECTOMY L4-L5 RIGHT EXCISION OF SYNOVIAL CYST RIGHT   (1 LEVEL) RIGHT PARTIAL FACETECTOMY;  Surgeon: Tobi Bastos, MD;  Location: [facility];  Service: Orthopedics;  Laterality: Right;   MYELOGRAM  04/06/2013   lumbar, Dr Gladstone Lighter   ORBITAL LESION EXCISION Right 09/01/2018   Procedure: ORBITOTOMY WITHOUT BONE FLAP WITH REMOVAL OF LESION RIGHT;  Surgeon: Karle Starch, MD;  Location: [place];  Service: Ophthalmology;  Laterality: Right;   PACEMAKER IMPLANT N/A 09/10/2017   Procedure: PACEMAKER IMPLANT;  Surgeon: Deboraha Sprang, MD;  Location: [place] [facility];  Service: Cardiovascular;  Laterality: N/A;   PANENDOSCOPY     PERIPHERAL VASCULAR BALLOON ANGIOPLASTY Left 03/16/2021   Procedure: PERIPHERAL VASCULAR BALLOON ANGIOPLASTY;  Surgeon: [name], MD;  Location: [place] [facility];  Service: Cardiovascular;  Laterality: Left;  subclavian  vein    PORTACATH PLACEMENT N/A 03/06/2017   Procedure: INSERTION PORT-A-CATH AND ASPIRATE SEROMA LEFT AXILLA;  Surgeon: Fanny Skates, MD;  Location: [place];  Service: General;  Laterality: N/A;   RIGHT/LEFT HEART CATH AND CORONARY/GRAFT ANGIOGRAPHY N/A 07/28/2017   Procedure: RIGHT/LEFT HEART CATH AND CORONARY/GRAFT ANGIOGRAPHY;  Surgeon: Sherren Mocha, MD;  Location: [place] [facility];  Service: Cardiovascular;  Laterality: N/A;   SHOULDER SURGERY Right 08/30/2010   screws placed; "tendons tore off"   SKIN CANCER EXCISION Right    "neck"   TONSILLECTOMY  ~ [place] Left 06/29/2019   Procedure: TOTAL KNEE ARTHROPLASTY;  Surgeon: Paralee Cancel, MD;  Location[facility];  Service: Orthopedics;  Laterality: Left;  70 mins   TRANSCATHETER AORTIC VALVE REPLACEMENT, TRANSFEMORAL N/A  09/09/2017   Procedure: TRANSCATHETER AORTIC VALVE REPLACEMENT, TRANSFEMORAL;  Surgeon: Burnell Blanks, MD;  Location: [place];  Service: Open Heart Surgery;  Laterality: N/A;   UPPER EXTREMITY VENOGRAPHY Left 02/15/2021   Procedure: CENTRAL VENO;  Surgeon: [name], MD;  Location: [place] [facility];  Service: Cardiovascular;  Laterality: Left;   UPPER EXTREMITY VENOGRAPHY N/A 03/16/2021   Procedure: UPPER EXTREMITY VENOGRAPHY;  Surgeon: [name], MD;  Location: [place];  Service: Cardiovascular;  Laterality: N/A;   UPPER GI ENDOSCOPY  07/02/2011   Gastritis; Dr Carlean Purl   VASECTOMY     wireless pacemaker placed       Current Outpatient Medications  Medication Sig Dispense Refill   acetaminophen (TYLENOL) 325 MG tablet Take 650 mg by mouth at bedtime as needed for moderate pain or headache.     allopurinol (ZYLOPRIM) 300 MG tablet Take 450 mg by mouth daily.     aluminum hydroxide-magnesium carbonate (GAVISCON) 95-358 MG/15ML SUSP Take 15 mLs by mouth as needed for indigestion or heartburn.     amLODipine (NORVASC) 5 MG tablet TAKE 1 TABLET BY MOUTH TWICE A DAY 180  tablet 3   amoxicillin (AMOXIL) 875 MG tablet Take 1 tablet (875 mg total) by mouth 2 (two) times daily for 10 days. 20 tablet 0   apixaban (ELIQUIS) 2.5 MG TABS tablet Take 1 tablet (2.5 mg total) by mouth 2 (two) times daily. 180 tablet 0   atorvastatin (LIPITOR) 80 MG tablet TAKE ONE TABLET BY MOUTH AT BEDTIME 90 tablet 3   azelastine (ASTELIN) 0.1 % nasal spray PLACE 2 SPRAYS INTO BOTH NOSTRILS AT BEDTIME AS NEEDED FOR RHINITIS OR ALLERGIES. 30 mL 4   dapagliflozin propanediol (FARXIGA) 10 MG TABS tablet Take 10 mg by mouth daily.     esomeprazole (NEXIUM) 40 MG capsule Take 1 capsule (40 mg total) by mouth daily. (Patient taking differently: Take 20 mg by mouth daily.) 30 capsule 5   eye wash (,SODIUM/POTASSIUM/SOD CHLORIDE,) SOLN Place 1 drop into both eyes at bedtime. [facility] Soothing Eye Wash     ezetimibe (ZETIA) 10 MG tablet TAKE 1 TABLET BY MOUTH EVERY DAY 90 tablet 3   fenofibrate 160 MG tablet TAKE 1 TABLET BY MOUTH EVERY DAY 90 tablet 0   folic acid (FOLVITE) 1 MG tablet TAKE 2 TABLETS BY MOUTH EVERY DAY 180 tablet 4   FREESTYLE LITE test strip USE TO TEST BLOOD SUGAR ONCE A DAY. DX CODE: E11.9 100 strip 12   glimepiride (AMARYL) 1 MG tablet TAKE 1 TABLET BY MOUTH EVERY DAY WITH BREAKFAST 90 tablet 1   icosapent Ethyl (VASCEPA) 1 g capsule TAKE 2 CAPSULES BY MOUTH TWICE A DAY 360 capsule 1   KLOR-CON M20 20 MEQ tablet TAKE 2 TABLETS (40 MEQ TOTAL) BY MOUTH 2 (TWO) TIMES DAILY. 360 tablet 3   Lancets (FREESTYLE) lancets USE ONCE A DAY TO CHECK BLOOD SUGAR. 100 each 12   Menthol-Camphor (ICY HOT ADVANCED PAIN RELIEF EX) Apply 1 application topically daily. Roll-on     metFORMIN (GLUCOPHAGE) 1000 MG tablet TAKE 1 & 1/2 TABLETS BY MOUTH EVERY MORNING AND 1 TABLET IN THE EVENING. 225 tablet 1   metoprolol tartrate (LOPRESSOR) 25 MG tablet TAKE ONE-HALF TABLET BY MOUTH TWICE DAILY 90 tablet 3   nitroGLYCERIN (NITROSTAT) 0.4 MG SL tablet PLACE 1 TABLET (0.4 MG TOTAL) UNDER THE TONGUE  EVERY 5 (FIVE) MINUTES AS NEEDED FOR CHEST PAIN. 25 tablet 6   pregabalin (LYRICA)  150 MG capsule Take 1 capsule (150 mg total) by mouth 2 (two) times daily. 180 capsule 1   psyllium (METAMUCIL) 58.6 % powder Take 1 packet by mouth daily as needed (constipation).     torsemide (DEMADEX) 20 MG tablet Take 20 mg by mouth daily.     No current facility-administered medications for this visit.   Facility-Administered Medications Ordered in Other Visits  Medication Dose Route Frequency Provider Last Rate Last Admin   sodium chloride flush (NS) 0.9 % injection 10 mL  10 mL Intravenous PRN [name], MD   10 mL at 05/30/17 0920    Allergies:   Benazepril, Hctz [hydrochlorothiazide], Benazepril hcl, Aspirin, and Lactose intolerance (gi)   ROS:  Please see the history of present illness.   Otherwise, review of systems are positive for neuropathy.   All other systems are reviewed and negative.    PHYSICAL EXAM: VS:  BP 100/60 (BP Location: Right Arm)   Pulse 80   Ht 6' (1.829 m)   Wt 254 lb 9.6 oz (115.5 kg)   SpO2 98%   BMI 34.53 kg/m  , BMI Body mass index is 34.53 kg/m. GENERAL:  Well appearing NECK:  No jugular venous distention, waveform within normal limits, carotid upstroke brisk and symmetric, no bruits, no thyromegaly LUNGS:  Clear to auscultation bilaterally CHEST:  Unremarkable HEART:  PMI not displaced or sustained,S1 and S2 within normal limits, no S3, no S4, no clicks, no rubs, soft apical systolic, no diastolic murmurs ABD:  Flat, positive bowel sounds normal in frequency in pitch, no bruits, no rebound, no guarding, no midline pulsatile mass, no hepatomegaly, no splenomegaly EXT:  2 plus pulses throughout, right greater than left leg edema, no cyanosis no clubbing, left arm swelling nonpitting   EKG:  EKG is not ordered today. The ekg ordered sinus rhythm with ventricular pacing 100% capture.  Rate 80   Recent Labs: 08/28/2021: ALT 10; BUN 30; Creatinine 1.63;  Potassium 4.7; Sodium 138 10/26/2021: Hemoglobin 12.6; Platelet Count 169    Lipid Panel    Component Value Date/Time   CHOL 168 07/26/2021 0912   CHOL 162 11/23/2020 0828   TRIG (H) 07/26/2021 0912    690.0 Triglyceride is over 400; calculations on Lipids are invalid.   HDL 32.40 (L) 07/26/2021 0912   HDL 38 (L) 11/23/2020 0828   CHOLHDL 5 07/26/2021 0912   VLDL 44.0 (H) 11/24/2018 0815   LDLCALC 89 11/23/2020 0828   LDLCALC  04/18/2020 0956     Comment:     . LDL cholesterol not calculated. Triglyceride levels greater than 400 mg/dL invalidate calculated LDL results. . Reference range: <100 . Desirable range <100 mg/dL for primary prevention;   <70 mg/dL for patients with CHD or diabetic patients  with > or = 2 CHD risk factors. [name] LDL-C is now calculated using the Martin-Hopkins  calculation, which is a validated novel method providing  better accuracy than the Friedewald equation in the  estimation of LDL-C.  [name] et al. [name]. 4008;676(19): 2061-2068  (http://education.[employer].com/faq/FAQ164)    LDLDIRECT 58.0 07/26/2021 0912      Wt Readings from Last 3 Encounters:  11/23/21 254 lb 9.6 oz (115.5 kg)  11/16/21 254 lb (115.2 kg)  10/26/21 251 lb (113.9 kg)      Other studies Reviewed: Additional studies/ records that were reviewed today include: Extensive [facility] of [facility] Review of the above records demonstrates:  Please see elsewhere in the note.  ASSESSMENT AND PLAN:    S/p TAVR:   I would not suspect any structural heart issues and so we will make no changes and will defer further imaging this year.     CAD s/p CABG:   He has had no anginal symptoms.  No change in therapy.   2-1 AV block s/p PPM: He has a leadless pacemaker placed now  Atrial flutter:   He tolerates anticoagulation and has had no symptomatic recurrence.   Hypertension: Blood pressure is at target.  No change in therapy.   Hyperlipidemia:   LDL is at target  but his triglycerides remain high.  He did see Dr. Debara Pickett and no change in therapy was suggested.  He understands low carbohydrate diet.    DM 2:  A1c was 6.7 down from 7.0.  No change in therapy.     Ascending aortic aneurysm:    His aorta was only 36 mm on recent CT.  No change in therapy or follow up is needed.  Spoke with the radiologist to review this today from his most recent CT.  Carotid stenosis:  This was bilateral less than 39% in June of last year.  No follow up is indicated at this time.    Sleep apnea: He wears CPAP  CKD IIIa: Creatinine was 1.63.  This is up from previous.    This can be followed by his primary provider.     Current medicines are reviewed at length with the patient today.  The patient does not have concerns regarding medicines.  The following changes have been made: None  Labs/ tests ordered today include:    No orders of the defined types were placed in this encounter.    Disposition:   FU with me in 1 year.     Signed, Minus Breeding, MD  11/23/2021 11:02 AM    [facility]

## 2021-11-23 ENCOUNTER — Encounter: Payer: Self-pay | Admitting: Cardiology

## 2021-11-23 ENCOUNTER — Ambulatory Visit (INDEPENDENT_AMBULATORY_CARE_PROVIDER_SITE_OTHER): Payer: Medicare Other | Admitting: Cardiology

## 2021-11-23 VITALS — BP 100/60 | HR 80 | Ht 72.0 in | Wt 254.6 lb

## 2021-11-23 DIAGNOSIS — Z952 Presence of prosthetic heart valve: Secondary | ICD-10-CM

## 2021-11-23 DIAGNOSIS — I442 Atrioventricular block, complete: Secondary | ICD-10-CM

## 2021-11-23 DIAGNOSIS — I25810 Atherosclerosis of coronary artery bypass graft(s) without angina pectoris: Secondary | ICD-10-CM | POA: Diagnosis not present

## 2021-11-23 DIAGNOSIS — I4892 Unspecified atrial flutter: Secondary | ICD-10-CM

## 2021-11-23 NOTE — Patient Instructions (Signed)
Medication Instructions:  Your physician recommends that you continue on your current medications as directed. Please refer to the Current Medication list given to you today.  *If you need a refill on your cardiac medications before your next appointment, please call your pharmacy*   Lab Work: None If you have labs (blood work) drawn today and your tests are completely normal, you will receive your results only by: MyChart Message (if you have MyChart) OR A paper copy in the mail If you have any lab test that is abnormal or we need to change your treatment, we will call you to review the results.   Testing/Procedures: None   Follow-Up: At [facility], you and your health needs are our priority.  As part of our continuing mission to provide you with exceptional heart care, we have created designated Provider Care Teams.  These Care Teams include your primary Cardiologist (physician) and Advanced Practice Providers (APPs -  Physician Assistants and Nurse Practitioners) who all work together to provide you with the care you need, when you need it.  We recommend signing up for the patient portal called "MyChart".  Sign up information is provided on this After Visit Summary.  MyChart is used to connect with patients for Virtual Visits (Telemedicine).  Patients are able to view lab/test results, encounter notes, upcoming appointments, etc.  Non-urgent messages can be sent to your provider as well.   To learn more about what you can do with MyChart, go to [email or web address].    Your next appointment:   1 year(s)  The format for your next appointment:   In Person  Provider:   James Hochrein, MD     Other Instructions   Important Information About Sugar       

## 2021-11-27 ENCOUNTER — Inpatient Hospital Stay: Payer: Medicare Other | Attending: Hematology & Oncology

## 2021-11-27 ENCOUNTER — Telehealth: Payer: Self-pay | Admitting: Family Medicine

## 2021-11-27 ENCOUNTER — Ambulatory Visit (HOSPITAL_BASED_OUTPATIENT_CLINIC_OR_DEPARTMENT_OTHER)
Admission: RE | Admit: 2021-11-27 | Discharge: 2021-11-27 | Disposition: A | Discharge status: Home / Self Care | Payer: Medicare Other | Source: Ambulatory Visit | Attending: Family Medicine | Admitting: Family Medicine

## 2021-11-27 ENCOUNTER — Encounter: Payer: Self-pay | Admitting: Family

## 2021-11-27 ENCOUNTER — Other Ambulatory Visit: Payer: Self-pay

## 2021-11-27 ENCOUNTER — Telehealth: Payer: Self-pay | Admitting: *Deleted

## 2021-11-27 ENCOUNTER — Inpatient Hospital Stay: Payer: Medicare Other

## 2021-11-27 ENCOUNTER — Inpatient Hospital Stay (HOSPITAL_BASED_OUTPATIENT_CLINIC_OR_DEPARTMENT_OTHER): Payer: Medicare Other | Admitting: Family

## 2021-11-27 VITALS — BP 114/65 | HR 73 | Temp 97.6°F | Resp 19 | Ht 72.0 in | Wt 254.1 lb

## 2021-11-27 DIAGNOSIS — R059 Cough, unspecified: Secondary | ICD-10-CM | POA: Insufficient documentation

## 2021-11-27 DIAGNOSIS — R042 Hemoptysis: Secondary | ICD-10-CM

## 2021-11-27 DIAGNOSIS — C8332 Diffuse large B-cell lymphoma, intrathoracic lymph nodes: Secondary | ICD-10-CM

## 2021-11-27 DIAGNOSIS — D500 Iron deficiency anemia secondary to blood loss (chronic): Secondary | ICD-10-CM

## 2021-11-27 DIAGNOSIS — D519 Vitamin B12 deficiency anemia, unspecified: Secondary | ICD-10-CM

## 2021-11-27 DIAGNOSIS — D510 Vitamin B12 deficiency anemia due to intrinsic factor deficiency: Secondary | ICD-10-CM | POA: Diagnosis not present

## 2021-11-27 DIAGNOSIS — D508 Other iron deficiency anemias: Secondary | ICD-10-CM

## 2021-11-27 LAB — CBC WITH DIFFERENTIAL (CANCER CENTER ONLY)
Abs Immature Granulocytes: 0.11 10*3/uL — ABNORMAL HIGH (ref 0.00–0.07)
Basophils Absolute: 0 10*3/uL (ref 0.0–0.1)
Basophils Relative: 0 %
Eosinophils Absolute: 0.1 10*3/uL (ref 0.0–0.5)
Eosinophils Relative: 1 %
HCT: 38.4 % — ABNORMAL LOW (ref 39.0–52.0)
Hemoglobin: 12.2 g/dL — ABNORMAL LOW (ref 13.0–17.0)
Immature Granulocytes: 2 %
Lymphocytes Relative: 20 %
Lymphs Abs: 1.3 10*3/uL (ref 0.7–4.0)
MCH: 29 pg (ref 26.0–34.0)
MCHC: 31.8 g/dL (ref 30.0–36.0)
MCV: 91.2 fL (ref 80.0–100.0)
Monocytes Absolute: 0.5 10*3/uL (ref 0.1–1.0)
Monocytes Relative: 8 %
Neutro Abs: 4.6 10*3/uL (ref 1.7–7.7)
Neutrophils Relative %: 69 %
Platelet Count: 151 10*3/uL (ref 150–400)
RBC: 4.21 MIL/uL — ABNORMAL LOW (ref 4.22–5.81)
RDW: 17.8 % — ABNORMAL HIGH (ref 11.5–15.5)
WBC Count: 6.6 10*3/uL (ref 4.0–10.5)
nRBC: 0 % (ref 0.0–0.2)

## 2021-11-27 LAB — LACTATE DEHYDROGENASE: LDH: 168 U/L (ref 98–192)

## 2021-11-27 LAB — RETICULOCYTES
Immature Retic Fract: 26.8 % — ABNORMAL HIGH (ref 2.3–15.9)
RBC.: 4.23 MIL/uL (ref 4.22–5.81)
Retic Count, Absolute: 73.2 10*3/uL (ref 19.0–186.0)
Retic Ct Pct: 1.7 % (ref 0.4–3.1)

## 2021-11-27 LAB — VITAMIN B12: Vitamin B-12: 174 pg/mL — ABNORMAL LOW (ref 180–914)

## 2021-11-27 LAB — FERRITIN: Ferritin: 973 ng/mL — ABNORMAL HIGH (ref 24–336)

## 2021-11-27 MED ORDER — CYANOCOBALAMIN 1000 MCG/ML IJ SOLN
1000.0000 ug | Freq: Once | INTRAMUSCULAR | Status: AC
  Administered 2021-11-27: 1000 ug via INTRAMUSCULAR
  Filled 2021-11-27: qty 1

## 2021-11-27 NOTE — Telephone Encounter (Signed)
Per 11/27/21 los - gave upcoming appointments - confirmed 

## 2021-11-27 NOTE — Telephone Encounter (Signed)
Pt seen for concern on 11/16/2021. Please advise

## 2021-11-27 NOTE — Telephone Encounter (Signed)
Left message on machine to call back  

## 2021-11-27 NOTE — Progress Notes (Signed)
Hematology and Oncology Follow Up Visit  Richard [name] [id number] 01/03/1941 81 y.o. 11/27/2021   Principle Diagnosis:  Diffuse large cell non-Hodgkin's lymphoma (IPI = 3) - NOT "double hit" Pernicious anemia Iron deficiency secondary to bleeding   Past Therapy: R-CHOP - s/p cycle 8 - completed 08/2017   Current Therapy:        Vitamin B12 1 mg IM every month Xgeva 120 mg subcu q 3 months - next dose due in 12/2021 IV iron as indicated          Interim History:  Richard [name] is here today for follow-up and B 12 injection. He is doing well and will be having surgery to stent a vein in his left arm to help with swelling next week.  He has SOB with over exertion and takes a break to rest as needed.  No fever, chills, n/v, cough, rash, dizziness, SOB, chest pain, palpitations, abdominal pain or changes in bowel or bladder habits.  Neuropathy in his lower extremities unchanged from baseline.  No falls or syncope to report.  Appetite and hydration are good. Weight is stable at 254 lbs.  He is still enjoying golf during the week.   ECOG Performance Status: 1 - Symptomatic but completely ambulatory  Medications:  Allergies as of 11/27/2021       Reactions   Benazepril Swelling   angioedema; he is not a candidate for any angiotensin receptor blockers because of this significant allergic reaction. Because of a history of documented adverse serious drug reaction;Medi Alert bracelet  is recommended   Hctz [hydrochlorothiazide] Anaphylaxis, Swelling   Tongue and lip swelling   Benazepril Hcl Other (See Comments)   Aspirin Other (See Comments)   Gastritis, cant take 325 Mg aspirin    Lactose Intolerance (gi) Nausea And Vomiting        Medication List        Accurate as of Nov 27, 2021  2:37 PM. If you have any questions, ask your nurse or doctor.          acetaminophen 325 MG tablet Commonly known as: TYLENOL Take 650 mg by mouth at bedtime as needed for moderate pain or  headache.   allopurinol 300 MG tablet Commonly known as: ZYLOPRIM Take 450 mg by mouth daily.   aluminum hydroxide-magnesium carbonate 95-358 MG/15ML Susp Commonly known as: GAVISCON Take 15 mLs by mouth as needed for indigestion or heartburn.   amLODipine 5 MG tablet Commonly known as: NORVASC TAKE 1 TABLET BY MOUTH TWICE A DAY   apixaban 2.5 MG Tabs tablet Commonly known as: ELIQUIS Take 1 tablet (2.5 mg total) by mouth 2 (two) times daily.   atorvastatin 80 MG tablet Commonly known as: LIPITOR TAKE ONE TABLET BY MOUTH AT BEDTIME   azelastine 0.1 % nasal spray Commonly known as: ASTELIN PLACE 2 SPRAYS INTO BOTH NOSTRILS AT BEDTIME AS NEEDED FOR RHINITIS OR ALLERGIES.   dapagliflozin propanediol 10 MG Tabs tablet Commonly known as: FARXIGA Take 10 mg by mouth daily.   esomeprazole 40 MG capsule Commonly known as: NexIUM Take 1 capsule (40 mg total) by mouth daily. What changed: how much to take   eye wash Soln Place 1 drop into both eyes at bedtime. [facility] Soothing Eye Wash   ezetimibe 10 MG tablet Commonly known as: ZETIA TAKE 1 TABLET BY MOUTH EVERY DAY   fenofibrate 160 MG tablet TAKE 1 TABLET BY MOUTH EVERY DAY   folic acid 1 MG tablet Commonly known as: [employer]  TAKE 2 TABLETS BY MOUTH EVERY DAY   freestyle lancets USE ONCE A DAY TO CHECK BLOOD SUGAR.   FREESTYLE LITE test strip Generic drug: glucose blood USE TO TEST BLOOD SUGAR ONCE A DAY. DX CODE: E11.9   glimepiride 1 MG tablet Commonly known as: AMARYL TAKE 1 TABLET BY MOUTH EVERY DAY WITH BREAKFAST   icosapent Ethyl 1 g capsule Commonly known as: VASCEPA TAKE 2 CAPSULES BY MOUTH TWICE A DAY   ICY HOT ADVANCED PAIN RELIEF EX Apply 1 application topically daily. Roll-on   Klor-Con M20 20 MEQ tablet Generic drug: potassium chloride SA TAKE 2 TABLETS (40 MEQ TOTAL) BY MOUTH 2 (TWO) TIMES DAILY.   metFORMIN 1000 MG tablet Commonly known as: GLUCOPHAGE TAKE 1 & 1/2 TABLETS BY MOUTH  EVERY MORNING AND 1 TABLET IN THE EVENING.   metoprolol tartrate 25 MG tablet Commonly known as: LOPRESSOR TAKE ONE-HALF TABLET BY MOUTH TWICE DAILY   nitroGLYCERIN 0.4 MG SL tablet Commonly known as: NITROSTAT PLACE 1 TABLET (0.4 MG TOTAL) UNDER THE TONGUE EVERY 5 (FIVE) MINUTES AS NEEDED FOR CHEST PAIN.   pregabalin 150 MG capsule Commonly known as: LYRICA Take 1 capsule (150 mg total) by mouth 2 (two) times daily.   psyllium 58.6 % powder Commonly known as: METAMUCIL Take 1 packet by mouth daily as needed (constipation).   torsemide 20 MG tablet Commonly known as: DEMADEX Take 20 mg by mouth daily.        Allergies:  Allergies  Allergen Reactions   Benazepril Swelling    angioedema; he is not a candidate for any angiotensin receptor blockers because of this significant allergic reaction. Because of a history of documented adverse serious drug reaction;Medi Alert bracelet  is recommended   Hctz [Hydrochlorothiazide] Anaphylaxis and Swelling    Tongue and lip swelling    Benazepril Hcl Other (See Comments)   Aspirin Other (See Comments)    Gastritis, cant take 325 Mg aspirin    Lactose Intolerance (Gi) Nausea And Vomiting    Past Medical History, Surgical history, Social history, and Family History were reviewed and updated.  Review of Systems: All other 10 point review of systems is negative.   Physical Exam:  vitals were not taken for this visit.   Wt Readings from Last 3 Encounters:  11/23/21 254 lb 9.6 oz (115.5 kg)  11/16/21 254 lb (115.2 kg)  10/26/21 251 lb (113.9 kg)    Ocular: Sclerae unicteric, pupils equal, round and reactive to light Ear-nose-throat: Oropharynx clear, dentition fair Lymphatic: No cervical or supraclavicular adenopathy Lungs no rales or rhonchi, good excursion bilaterally Heart regular rate and rhythm, no murmur appreciated Abd soft, nontender, positive bowel sounds MSK no focal spinal tenderness, no joint edema Neuro:  non-focal, well-oriented, appropriate affect Breasts: Deferred   Lab Results  Component Value Date   WBC 6.6 11/27/2021   HGB 12.2 (L) 11/27/2021   HCT 38.4 (L) 11/27/2021   MCV 91.2 11/27/2021   PLT 151 11/27/2021   Lab Results  Component Value Date   FERRITIN 1,377 (H) 10/26/2021   IRON 82 10/26/2021   TIBC 388 10/26/2021   UIBC 306 10/26/2021   IRONPCTSAT 21 10/26/2021   Lab Results  Component Value Date   RETICCTPCT 1.7 11/27/2021   RBC 4.23 11/27/2021   RETICCTABS 52.1 11/22/2011   No results found for: Nils Pyle [facility] Lab Results  Component Value Date   IGA 159 03/09/2012   Lab Results  Component Value Date   ALBUMINELP 4.2  07/27/2018   MSPIKE Not Observed 07/27/2018     Chemistry      Component Value Date/Time   NA 138 08/28/2021 1255   NA 136 03/16/2019 0938   NA 144 06/27/2017 0857   K 4.7 08/28/2021 1255   K 3.9 06/27/2017 0857   CL 100 08/28/2021 1255   CL 103 06/27/2017 0857   CO2 28 08/28/2021 1255   CO2 26 06/27/2017 0857   BUN 30 (H) 08/28/2021 1255   BUN 34 (H) 03/16/2019 0938   BUN 12 06/27/2017 0857   CREATININE 1.63 (H) 08/28/2021 1255   CREATININE 1.65 (H) 04/18/2020 0956      Component Value Date/Time   CALCIUM 10.3 08/28/2021 1255   CALCIUM 9.2 06/27/2017 0857   ALKPHOS 56 08/28/2021 1255   ALKPHOS 128 (H) 06/27/2017 0857   AST 14 (L) 08/28/2021 1255   ALT 10 08/28/2021 1255   ALT 24 06/27/2017 0857   BILITOT 0.6 08/28/2021 1255       Impression and Plan: Richard [name] is a very pleasant 81 yo caucasian gentleman with diffuse large B-cell lymphoma (not "double hit" lymphoma). He completed treatment in February 2019.  B 12 injection given.  Iron studies are pending.  Follow-up in 1 month.   Lottie Dawson, NP 5/30/20232:37 PM

## 2021-11-27 NOTE — Patient Instructions (Signed)
Vitamin B12 Deficiency Vitamin B12 deficiency occurs when the body does not have enough of this important vitamin. The body needs this vitamin: To make red blood cells. To make DNA. This is the genetic material inside cells. To help the nerves work properly so they can carry messages from the brain to the body. Vitamin B12 deficiency can cause health problems, such as not having enough red blood cells in the blood (anemia). This can lead to nerve damage if untreated. What are the causes? This condition may be caused by: Not eating enough foods that contain vitamin B12. Not having enough stomach acid and digestive fluids to properly absorb vitamin B12 from the food that you eat. Having certain diseases that make it hard to absorb vitamin B12. These diseases include Crohn's disease, chronic pancreatitis, and cystic fibrosis. An autoimmune disorder in which the body does not make enough of a protein (intrinsic factor) within the stomach, resulting in not enough absorption of vitamin B12. Having a surgery in which part of the stomach or small intestine is removed. Taking certain medicines that make it hard for the body to absorb vitamin B12. These include: Heartburn medicines, such as antacids and proton pump inhibitors. Some medicines that are used to treat diabetes. What increases the risk? The following factors may make you more likely to develop a vitamin B12 deficiency: Being an older adult. Eating a vegetarian or vegan diet that does not include any foods that come from animals. Eating a poor diet while you are pregnant. Taking certain medicines. Having alcoholism. What are the signs or symptoms? In some cases, there are no symptoms of this condition. If the condition leads to anemia or nerve damage, various symptoms may occur, such as: Weakness. Tiredness (fatigue). Loss of appetite. Numbness or tingling in your hands and feet. Redness and burning of the tongue. Depression,  confusion, or memory problems. Trouble walking. If anemia is severe, symptoms can include: Shortness of breath. Dizziness. Rapid heart rate. How is this diagnosed? This condition may be diagnosed with a blood test to measure the level of vitamin B12 in your blood. You may also have other tests, including: A group of tests that measure certain characteristics of blood cells (complete blood count, CBC). A blood test to measure intrinsic factor. A procedure where a thin tube with a camera on the end is used to look into your stomach or intestines (endoscopy). Other tests may be needed to discover the cause of the deficiency. How is this treated? Treatment for this condition depends on the cause. This condition may be treated by: Changing your eating and drinking habits, such as: Eating more foods that contain vitamin B12. Drinking less alcohol or no alcohol. Getting vitamin B12 injections. Taking vitamin B12 supplements by mouth (orally). Your health care provider will tell you which dose is best for you. Follow these instructions at home: Eating and drinking  Include foods in your diet that come from animals and contain a lot of vitamin B12. These include: Meats and poultry. This includes beef, pork, chicken, turkey, and organ meats, such as liver. Seafood. This includes clams, rainbow trout, salmon, tuna, and haddock. Eggs. Dairy foods such as milk, yogurt, and cheese. Eat foods that have vitamin B12 added to them (are fortified), such as ready-to-eat breakfast cereals. Check the label on the package to see if a food is fortified. The items listed above may not be a complete list of foods and beverages you can eat and drink. Contact a dietitian for   more information. Alcohol use Do not drink alcohol if: Your health care provider tells you not to drink. You are pregnant, may be pregnant, or are planning to become pregnant. If you drink alcohol: Limit how much you have to: 0-1 drink a  day for women. 0-2 drinks a day for men. Know how much alcohol is in your drink. In the [place] one drink equals one 12 oz bottle of beer (355 mL), one 5 oz glass of wine (148 mL), or one 1 oz glass of hard liquor (44 mL). General instructions Get vitamin B12 injections if told to by your health care provider. Take supplements only as told by your health care provider. Follow the directions carefully. Keep all follow-up visits. This is important. Contact a health care provider if: Your symptoms come back. Your symptoms get worse or do not improve with treatment. Get help right away: You develop shortness of breath. You have a rapid heart rate. You have chest pain. You become dizzy or you faint. These symptoms may be an emergency. Get help right away. Call 911. Do not wait to see if the symptoms will go away. Do not drive yourself to the hospital. Summary Vitamin B12 deficiency occurs when the body does not have enough of this important vitamin. Common causes include not eating enough foods that contain vitamin B12, not being able to absorb vitamin B12 from the food that you eat, having a surgery in which part of the stomach or small intestine is removed, or taking certain medicines. Eat foods that have vitamin B12 in them. Treatment may include making a change in the way you eat and drink, getting vitamin B12 injections, or taking vitamin B12 supplements. This information is not intended to replace advice given to you by your health care provider. Make sure you discuss any questions you have with your health care provider. Document Revised: 02/09/2021 Document Reviewed: 02/09/2021 Else[employer] Patient Education  2023 [employer]  

## 2021-11-27 NOTE — Telephone Encounter (Signed)
Patient states he's still coughing up blood and would like suggestions on next step.Richard [name]

## 2021-11-27 NOTE — Telephone Encounter (Signed)
Patient advised.  He was here in the building and will be able to get cxr done here.  CXR ordered.

## 2021-11-28 ENCOUNTER — Telehealth: Payer: Self-pay | Admitting: Cardiology

## 2021-11-28 LAB — IRON AND IRON BINDING CAPACITY (CC-WL,HP ONLY)
Iron: 52 ug/dL (ref 45–182)
Saturation Ratios: 14 % — ABNORMAL LOW (ref 17.9–39.5)
TIBC: 371 ug/dL (ref 250–450)
UIBC: 319 ug/dL (ref 117–376)

## 2021-11-28 NOTE — Telephone Encounter (Signed)
Left message to call back  

## 2021-11-28 NOTE — Telephone Encounter (Signed)
Pt c/o Shortness Of Breath: STAT if SOB developed within the last 24 hours or pt is noticeably SOB on the phone  1. Are you currently SOB (can you hear that pt is SOB on the phone)? no  2. How long have you been experiencing SOB?   About 2 months ago, but getting worse  3. Are you SOB when sitting or when up moving around? When walking and it is getting worse  4. Are you currently experiencing any other symptoms? Weakness in his legs- went to his primary doctor,  she told him to see his Cardiologist- I made an appointment with Vin for tomorrow(11-28-21) please call to evaluate

## 2021-11-29 ENCOUNTER — Encounter: Payer: Self-pay | Admitting: *Deleted

## 2021-11-29 ENCOUNTER — Encounter: Payer: Self-pay | Admitting: Physician Assistant

## 2021-11-29 ENCOUNTER — Ambulatory Visit (INDEPENDENT_AMBULATORY_CARE_PROVIDER_SITE_OTHER): Payer: Medicare Other | Admitting: Physician Assistant

## 2021-11-29 VITALS — BP 112/64 | HR 72 | Ht 72.0 in | Wt 253.8 lb

## 2021-11-29 DIAGNOSIS — Z952 Presence of prosthetic heart valve: Secondary | ICD-10-CM

## 2021-11-29 DIAGNOSIS — R079 Chest pain, unspecified: Secondary | ICD-10-CM | POA: Diagnosis not present

## 2021-11-29 DIAGNOSIS — I4892 Unspecified atrial flutter: Secondary | ICD-10-CM | POA: Diagnosis not present

## 2021-11-29 DIAGNOSIS — Z79899 Other long term (current) drug therapy: Secondary | ICD-10-CM

## 2021-11-29 DIAGNOSIS — R0609 Other forms of dyspnea: Secondary | ICD-10-CM | POA: Diagnosis not present

## 2021-11-29 DIAGNOSIS — I25810 Atherosclerosis of coronary artery bypass graft(s) without angina pectoris: Secondary | ICD-10-CM

## 2021-11-29 NOTE — Addendum Note (Signed)
Addended by: Juventino Slovak on: 11/29/2021 02:02 PM   Modules accepted: Orders

## 2021-11-29 NOTE — Patient Instructions (Addendum)
Medication Instructions:  Your physician has recommended you make the following change in your medication:  1.Take torsemide 40 mg by mouth daily for the next 3 days, then resume your usual dose 2.Take potassium 80 meq in the morning and 40 meq in the evening for the next 3 days, then resume your usual dose *If you need a refill on your cardiac medications before your next appointment, please call your pharmacy*   Lab Work: BNP and BMP today If you have labs (blood work) drawn today and your tests are completely normal, you will receive your results only by: [facility] (if you have MyChart) OR A paper copy in the mail If you have any lab test that is abnormal or we need to change your treatment, we will call you to review the results.   Testing/Procedures: Your physician has requested that you have a lexiscan myoview. For further information please visit [email or web address]. Please follow instruction sheet, as given.   Your physician has requested that you have an echocardiogram. Echocardiography is a painless test that uses sound waves to create images of your heart. It provides your doctor with information about the size and shape of your heart and how well your heart's chambers and valves are working. This procedure takes approximately one hour. There are no restrictions for this procedure.    Follow-Up: At [facility], you and your health needs are our priority.  As part of our continuing mission to provide you with exceptional heart care, we have created designated Provider Care Teams.  These Care Teams include your primary Cardiologist (physician) and Advanced Practice Providers (APPs -  Physician Assistants and Nurse Practitioners) who all work together to provide you with the care you need, when you need it.   Your next appointment:   4 week(s)  The format for your next appointment:   In Person  Provider:   Minus Breeding, MD  or APP   Important Information About  [employer]

## 2021-11-29 NOTE — Progress Notes (Signed)
Cardiology Office Note:    Date:  11/29/2021   ID:  Richard [name], DOB Feb 28, 1941, MRN [id number]  PCP:  Carollee Herter, Alferd Apa, DO  [place] Cardiologist:  Minus Breeding, MD  [facility] Electrophysiologist:  [name], MD   Chief Complaint: shortness of breath   History of Present Illness:    Richard [name] is a 81 y.o. male with a hx of CAD s/p CABG in 2001, aortic stenosis s/p TVAR, CHB s/p PPM now leadless pacemaker 08/2021 at [facility] to subclavian vein stenosis s/p ballon angioplasty,  hypertension, permanent atrial fibrillation/Flutter, dyslipidemia with high triglycerides, OSA on CPAP, DM2 , CKD III, GI bleed and Non Hodgkin's lymphoma seen for SOB.   He was admitted for NSTEMI in January, and underwent cardiac catheterization on 07/28/2017 which showed severe three-vessel native CAD with total occlusion of LAD, ramus intermedius, first OM and RCA, patent RIMA to PDA, LIMA to LAD, sequential SVG to ramus intermedius and first OM.  Severe calcific aortic stenosis with mean transvalvular gradient of 51 mmHg.  He was readmitted in February 2019 for possible GI bleed and pancytopenia, he developed new onset of atrial flutter during that admission, he is not considered a candidate for full dose anticoagulation due to anemia and GI bleed.  He eventually underwent 26 mm TAVR with Edwards Sapien 3 THV by Dr. Angelena Form and Cyndia Bent.  Postop, patient did develop 2-1 heart block on no AV nodal blocking agent.  He was seen by Dr. Caryl Comes in the agreed to undergo pacemaker implantation.   Hx of left upper extremity edema secondary to subclavian vein stenosis about pacemaker wire insertion.  Limited benefit from simple venoplasty by Dr. Stanford Breed. He underwent leadless pacemaker implantation at [facility] 08/2021.   He underwent successful balloon angioplasty of left subclavian vein stenosis on 10/22/21 @ [facility] and plan for additional angioplasty/stent.   He was doing well when seen by Dr.  Percival Spanish 11/23/21.  Added to my schedule for shortness of breath.  Patient reported 1 month history of progressively worsening dyspnea on exertion.  Denies exertional tightness.  He forgot to mention this to Dr. Percival Spanish.  Reported treated for COVID 2 months ago.  He had a sore throat 2 weeks ago and completed antibiotic few days ago.  Also reported intermittent bright red blood when coughing.  No dark hemoptysis.  Also reported intermittent lower extremity edema.  He has left upper extremity edema.  Does endorse eating high salt diet.  Blood work done by oncologist yesterday showing low iron, plan for iron infusion. Chest x-ray yesterday showed pulmonary edema.   Past Medical History:  Diagnosis Date   Anemia    Arthritis    hips   Axillary adenopathy 02/25/2017   Bradycardia    a. holter monitor has demonstrated HRs in 30s and [place]ink[place]ach    CAD (coronary artery disease)    a. s/p CABG 2001  b.  07/28/2017 cath:   Severe three-vessel native CAD with total occlusion of LAD, ramus intermedius, first OM and RCA, patent RIMA to PDA, LIMA to LAD, sequential SVG to ramus intermedius and first OM.     Chronic lower back pain    Diffuse large B cell lymphoma (HCC)    Diverticulosis    Esophageal stricture    GERD (gastroesophageal reflux disease)    History of gout    HTN (hypertension)    Mixed hyperlipidemia    OSA on CPAP    with 2L O2 at night  Pancytopenia ([place])    a. related to chemo therapy for B cell lymphoma   Peptic stricture of esophagus    Presence of permanent cardiac pacemaker    sees Dr. Valaria Good pacemaker   SCC (squamous cell carcinoma) 01/31/2021   R zygoma, [facility]   SCC (squamous cell carcinoma) 01/31/2021   L post ankle, [facility]   SCC (squamous cell carcinoma) 01/31/2021   L popliteal, [facility]   Severe aortic stenosis    Spinal stenosis    Squamous cell carcinoma of skin 03/22/2009   Right mandible. SCCis, hypertrophic.    Type II diabetes mellitus ([place])    Wears  dentures    partial upper    Past Surgical History:  Procedure Laterality Date   APPENDECTOMY  ~ [place] Left 11/06/2016   Procedure: CATARACT EXTRACTION PHACO AND INTRAOCULAR LENS PLACEMENT (IOC);  Surgeon: Estill Cotta, MD;  Location: [facility] ORS;  Service: Ophthalmology;  Laterality: Left;  Lot # [id number] H [place]: 01:09.4 AP%:25.2 CDE: 30.64   CATARACT EXTRACTION W/PHACO Right 12/04/2016   Procedure: CATARACT EXTRACTION PHACO AND INTRAOCULAR LENS PLACEMENT (IOC);  Surgeon: Estill Cotta, MD;  Location: [facility] ORS;  Service: Ophthalmology;  Laterality: Right;  [place] 1:25.9 AP% 24.1 CDE 39.10 Fluid Pack lot # [id number]   COLONOSCOPY W/ BIOPSIES AND POLYPECTOMY  07/02/2011   CORONARY ANGIOPLASTY  07/02/1991   CORONARY ANGIOPLASTY WITH STENT PLACEMENT  05/31/1997   "1"   CORONARY ARTERY BYPASS GRAFT  03/01/2000   "CABG X5"   ECTROPION REPAIR Right 09/01/2018   Procedure: REPAIR OF ECTROPION BILATERAL upper and lower;  Surgeon: Karle Starch, MD;  Location: [place];  Service: Ophthalmology;  Laterality: Right;  Diabetic - oral meds sleep apnea   ESOPHAGEAL DILATION  X 3-4   Dr. Lyla Son; "last one was in the 1990's"   ESOPHAGOGASTRODUODENOSCOPY     multiple   FLEXIBLE SIGMOIDOSCOPY     multiple   HYDRADENITIS EXCISION Left 02/25/2017   Procedure: EXCISION DEEP LEFT AXILLARY LYMPH NODE;  Surgeon: Fanny Skates, MD;  Location: [place];  Service: General;  Laterality: Left;   INTRAOPERATIVE TRANSTHORACIC ECHOCARDIOGRAM N/A 09/09/2017   Procedure: INTRAOPERATIVE TRANSTHORACIC ECHOCARDIOGRAM;  Surgeon: Burnell Blanks, MD;  Location: [place];  Service: Open Heart Surgery;  Laterality: N/A;   KNEE ARTHROSCOPY Left 07/01/2009   meniscus repair   LEFT HEART CATHETERIZATION WITH CORONARY/GRAFT ANGIOGRAM N/A 03/16/2014   Procedure: LEFT HEART CATHETERIZATION WITH Beatrix Fetters;  Surgeon: Burnell Blanks, MD;   Location: [facility];  Service: Cardiovascular;  Laterality: N/A;   LUMBAR LAMINECTOMY/DECOMPRESSION MICRODISCECTOMY Right 06/17/2013   Procedure: LUMBAR LAMINECTOMY MICRODISCECTOMY L4-L5 RIGHT EXCISION OF SYNOVIAL CYST RIGHT   (1 LEVEL) RIGHT PARTIAL FACETECTOMY;  Surgeon: Tobi Bastos, MD;  Location: [facility];  Service: Orthopedics;  Laterality: Right;   MYELOGRAM  04/06/2013   lumbar, Dr Gladstone Lighter   ORBITAL LESION EXCISION Right 09/01/2018   Procedure: ORBITOTOMY WITHOUT BONE FLAP WITH REMOVAL OF LESION RIGHT;  Surgeon: Karle Starch, MD;  Location: [place];  Service: Ophthalmology;  Laterality: Right;   PACEMAKER IMPLANT N/A 09/10/2017   Procedure: PACEMAKER IMPLANT;  Surgeon: Deboraha Sprang, MD;  Location: [place] CV LAB;  Service: Cardiovascular;  Laterality: N/A;   PANENDOSCOPY     PERIPHERAL VASCULAR BALLOON ANGIOPLASTY Left 03/16/2021   Procedure: PERIPHERAL VASCULAR BALLOON ANGIOPLASTY;  Surgeon: [name], MD;  Location: [place] CV LAB;  Service: Cardiovascular;  Laterality: Left;  subclavian  vein   PORTACATH PLACEMENT N/A 03/06/2017   Procedure: INSERTION PORT-A-CATH AND ASPIRATE SEROMA LEFT AXILLA;  Surgeon: Fanny Skates, MD;  Location: [place];  Service: General;  Laterality: N/A;   RIGHT/LEFT HEART CATH AND CORONARY/GRAFT ANGIOGRAPHY N/A 07/28/2017   Procedure: RIGHT/LEFT HEART CATH AND CORONARY/GRAFT ANGIOGRAPHY;  Surgeon: Sherren Mocha, MD;  Location: [place] CV LAB;  Service: Cardiovascular;  Laterality: N/A;   SHOULDER SURGERY Right 08/30/2010   screws placed; "tendons tore off"   SKIN CANCER EXCISION Right    "neck"   TONSILLECTOMY  ~ [place] Left 06/29/2019   Procedure: TOTAL KNEE ARTHROPLASTY;  Surgeon: Paralee Cancel, MD;  Location: [facility];  Service: Orthopedics;  Laterality: Left;  70 mins   TRANSCATHETER AORTIC VALVE REPLACEMENT, TRANSFEMORAL N/A 09/09/2017   Procedure: TRANSCATHETER AORTIC VALVE REPLACEMENT,  TRANSFEMORAL;  Surgeon: Burnell Blanks, MD;  Location: [place];  Service: Open Heart Surgery;  Laterality: N/A;   UPPER EXTREMITY VENOGRAPHY Left 02/15/2021   Procedure: CENTRAL VENO;  Surgeon: [name], MD;  Location: [place] CV LAB;  Service: Cardiovascular;  Laterality: Left;   UPPER EXTREMITY VENOGRAPHY N/A 03/16/2021   Procedure: UPPER EXTREMITY VENOGRAPHY;  Surgeon: [name], MD;  Location: [place] CV LAB;  Service: Cardiovascular;  Laterality: N/A;   UPPER GI ENDOSCOPY  07/02/2011   Gastritis; Dr Carlean Purl   VASECTOMY     wireless pacemaker placed      Current Medications: No outpatient medications have been marked as taking for the 11/29/21 encounter (Office Visit) with Leanor Kail, [place].     Allergies:   Benazepril, Hctz [hydrochlorothiazide], Benazepril hcl, Aspirin, and Lactose intolerance (gi)   Social History   Socioeconomic History   Marital status: Divorced    Spouse name: Not on file   Number of children: 2   Years of education: college   Highest education level: Not on file  Occupational History    Employer: RETIRED  Tobacco Use   Smoking status: Never   Smokeless tobacco: Never  Vaping Use   Vaping Use: Never used  Substance and Sexual Activity   Alcohol use: No    Alcohol/week: 0.0 standard drinks    Comment: "last drink was in 2012"( 03/15/2014)   Drug use: No   Sexual activity: Not Currently  Other Topics Concern   Not on file  Social History Narrative   ** Merged History Encounter **       Divorced, lives with a roommate. 1 son one daughter 3-4 caffeinated beverages daily Right-handed. 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   Social Determinants of Health   Financial Resource Strain: Low Risk    Difficulty of Paying Living Expenses: Not very hard  Food Insecurity: No Food Insecurity    Worried About Charity fundraiser in the Last Year: Never true   Ran Out of Food in the Last Year: Never true  Transportation Needs: No Transportation Needs   Lack of Transportation (Medical): No   Lack of Transportation (Non-Medical): No  Physical Activity: Sufficiently Active   Days of Exercise per Week: 5 days   Minutes of Exercise per Session: 30 min  Stress: Not on file  Social Connections: Moderately Isolated   Frequency of Communication with Friends and Family: More than three times a week   Frequency  of Social Gatherings with Friends and Family: More than three times a week   Attends Religious Services: More than 4 times per year   Active Member of [employer] or Organizations: No   Attends Music therapist: Never   Marital Status: Divorced     Family History: The patient's family history includes Diabetes in his maternal grandmother; Heart attack in his paternal grandmother; Hypertension in his father; Pancreatic cancer in his mother; Stroke in his father and maternal grandmother. There is no history of Colon cancer, Esophageal cancer, Rectal cancer, Stomach cancer, or Ulcers.    ROS:   Please see the history of present illness.    All other systems reviewed and are negative.   EKGs/Labs/Other Studies Reviewed:    The following studies were reviewed today:  Echo 12/23/19 1. Left ventricular ejection fraction, by estimation, is 60 to 65%. The  left ventricle has normal function. The left ventricle has no regional  wall motion abnormalities. Left ventricular diastolic parameters are  consistent with Grade I diastolic  dysfunction (impaired relaxation). Elevated left ventricular end-diastolic  pressure. The average left ventricular global longitudinal strain is -19.9  %. The global longitudinal strain is normal.   2. Right ventricular systolic function is normal. The right ventricular  size is mildly enlarged. There is normal pulmonary artery systolic  pressure.    3. The mitral valve is normal in structure. Mild mitral valve  regurgitation. No evidence of mitral stenosis.   4. The aortic valve has been repaired/replaced. Aortic valve  regurgitation is not visualized. No aortic stenosis is present. There is a  26 mm Edwards Sapien prosthetic (TAVR) valve present in the aortic  position. Procedure Date: 09/09/17. Echo findings  are consistent with normal structure and function of the aortic valve  prosthesis. Aortic valve area, by VTI measures 2.75 cm. Aortic valve mean  gradient measures 15.0 mmHg. Aortic valve Vmax measures 2.57 m/s. DI 0.56.  AVA 2.62cm2.   5. Aortic dilatation noted. There is mild dilatation at the level of the  sinuses of Valsalva and of the ascending aorta measuring 42 mm and 70m  respectively.   6. The inferior vena cava is normal in size with greater than 50%  respiratory variability, suggesting right atrial pressure of 3 mmHg.   7. Right atrial size was mildly dilated.   Comparison(s): 11/05/18 EF 50-55%. AV 147mg mean PG, 3161m peak PG.  Compared to echo 10/2018, no significant change. Normal functioning TAVR  with stable gradients (peak AVG 27 and mean AVG 70m37m and no  perivalvular AI.   EKG:  EKG is ordered today.  The ekg ordered today demonstrates V paced rhythm  Recent Labs: 08/28/2021: ALT 10; BUN 30; Creatinine 1.63; Potassium 4.7; Sodium 138 11/27/2021: Hemoglobin 12.2; Platelet Count 151  Recent Lipid Panel    Component Value Date/Time   CHOL 168 07/26/2021 0912   CHOL 162 11/23/2020 0828   TRIG (H) 07/26/2021 0912    690.0 Triglyceride is over 400; calculations on Lipids are invalid.   HDL 32.40 (L) 07/26/2021 0912   HDL 38 (L) 11/23/2020 0828   CHOLHDL 5 07/26/2021 0912   VLDL 44.0 (H) 11/24/2018 0815   LDLCALC 89 11/23/2020 0828   LDLCALC  04/18/2020 0956     Comment:     . LDL cholesterol not calculated. Triglyceride levels greater than 400 mg/dL invalidate calculated LDL results. . Reference  range: <100 . Desirable range <100 mg/dL for primary prevention;   <70 mg/dL for patients  with CHD or diabetic patients  with > or = 2 CHD risk factors. [name] LDL-C is now calculated using the Martin-Hopkins  calculation, which is a validated novel method providing  better accuracy than the Friedewald equation in the  estimation of LDL-C.  [name] et al. [name]. 4540;981(19): 2061-2068  (http://education.QuestDiagnostics.com/faq/FAQ164)    LDLDIRECT 58.0 07/26/2021 0912    Physical Exam:    VS:  BP 112/64   Pulse 72   Ht 6' (1.829 m)   Wt 253 lb 12.8 oz (115.1 kg)   SpO2 95%   BMI 34.42 kg/m     Wt Readings from Last 3 Encounters:  11/29/21 253 lb 12.8 oz (115.1 kg)  11/27/21 254 lb 1.9 oz (115.3 kg)  11/23/21 254 lb 9.6 oz (115.5 kg)     GEN:  Well nourished, well developed in no acute distress HEENT: Normal NECK: No JVD; No carotid bruits LYMPHATICS: No lymphadenopathy CARDIAC: RRR, no murmurs, rubs, gallops RESPIRATORY:  Clear to auscultation without rales, wheezing or rhonchi  ABDOMEN: Soft, non-tender, non-distended MUSCULOSKELETAL: Trace bilateral lower extremity edema; left upper extremity edema, no deformity  SKIN: Warm and dry NEUROLOGIC:  Alert and oriented x 3 PSYCHIATRIC:  Normal affect   ASSESSMENT AND PLAN:    Dyspnea on exertion Likely multifactorial from deconditioning, low iron, worsening valvular disease +/- CAD. He is scheduled for iron infusion next week. Evidence of pulmonary edema on chest x-ray.  He does endorse eating high salt. Recommended low-sodium diet, leg elevation and compression stocking.  Increase torsemide for 3 days with supplemental potassium. Update echocardiogram and stress test.  2.  CAD s/p CABG -His dyspnea on exertion could be anginal equivalent.  Not recall his prior symptoms properly.  Get chest chest.  Not on aspirin due to need of anticoagulation.  Continue Zetia and Lipitor.  3.  Aortic stenosis s/p TAVR -Update  echocardiogram  4.  Left subclavian stenosis -No improvement despite angioplasty.  He is scheduled for stenting in 1 week or 2.  5.  CKD stage III -BMP today  6.  Permanent atrial fibrillation/flutter -V paced rhythm.  Continue Eliquis.  7.  Coughing up blood -Treated for COVID 2 months ago.  He is also treated with antibiotic for strep throat in past 1 to 2 weeks.  Symptoms started while on antibiotic.  No dark hemoptysis.  Seems upper throat trauma.  Continue Eliquis.  Advised to follow-up with PCP/pulmonary.  8.  complete heart block s/p pacemaker -Now has delayed pacemaker.  Shared Decision Making/Informed Consent The risks [chest pain, shortness of breath, cardiac arrhythmias, dizziness, blood pressure fluctuations, myocardial infarction, stroke/transient ischemic attack, nausea, vomiting, allergic reaction, radiation exposure, metallic taste sensation and life-threatening complications (estimated to be 1 in 10,000)], benefits (risk stratification, diagnosing coronary artery disease, treatment guidance) and alternatives of a nuclear stress test were discussed in detail with Richard [name] and he agrees to proceed.    Medication Adjustments/Labs and Tests Ordered: Current medicines are reviewed at length with the patient today.  Concerns regarding medicines are outlined above.  Orders Placed This Encounter  Procedures   NM Myocar Single W/Spect W/Wall Motion And EF   Basic Metabolic Panel (BMET)   Pro b natriuretic peptide (BNP)   EKG 12-Lead   ECHOCARDIOGRAM COMPLETE   No orders of the defined types were placed in this encounter.   Patient Instructions  Medication Instructions:  Your physician has recommended you make the following change in your medication:  1.Take torsemide 40 mg by  mouth daily for the next 3 days, then resume your usual dose 2.Take potassium 80 meq in the morning and 40 meq in the evening for the next 3 days, then resume your usual dose *If you need a  refill on your cardiac medications before your next appointment, please call your pharmacy*   Lab Work: BNP and BMP today If you have labs (blood work) drawn today and your tests are completely normal, you will receive your results only by: [place] (if you have MyChart) OR A paper copy in the mail If you have any lab test that is abnormal or we need to change your treatment, we will call you to review the results.   Testing/Procedures: Your physician has requested that you have a lexiscan myoview. For further information please visit [email or web address]. Please follow instruction sheet, as given.   Your physician has requested that you have an echocardiogram. Echocardiography is a painless test that uses sound waves to create images of your heart. It provides your doctor with information about the size and shape of your heart and how well your heart's chambers and valves are working. This procedure takes approximately one hour. There are no restrictions for this procedure.    Follow-Up: At [facility], you and your health needs are our priority.  As part of our continuing mission to provide you with exceptional heart care, we have created designated Provider Care Teams.  These Care Teams include your primary Cardiologist (physician) and Advanced Practice Providers (APPs -  Physician Assistants and Nurse Practitioners) who all work together to provide you with the care you need, when you need it.   Your next appointment:   4 week(s)  The format for your next appointment:   In Person  Provider:   Minus Breeding, MD  or APP   Important Information About Sugar         Jarrett Soho, [place]  11/29/2021 1:48 PM    [facility]

## 2021-11-30 LAB — BASIC METABOLIC PANEL
BUN/Creatinine Ratio: 20 (ref 10–24)
BUN: 34 mg/dL — ABNORMAL HIGH (ref 8–27)
CO2: 21 mmol/L (ref 20–29)
Calcium: 10.2 mg/dL (ref 8.6–10.2)
Chloride: 102 mmol/L (ref 96–106)
Creatinine, Ser: 1.69 mg/dL — ABNORMAL HIGH (ref 0.76–1.27)
Glucose: 80 mg/dL (ref 70–99)
Potassium: 5.1 mmol/L (ref 3.5–5.2)
Sodium: 141 mmol/L (ref 134–144)
eGFR: 40 mL/min/{1.73_m2} — ABNORMAL LOW (ref >59)

## 2021-11-30 LAB — PRO B NATRIURETIC PEPTIDE: NT-Pro BNP: 5385 pg/mL — ABNORMAL HIGH (ref 0–486)

## 2021-12-05 DIAGNOSIS — I871 Compression of vein: Secondary | ICD-10-CM | POA: Diagnosis not present

## 2021-12-05 DIAGNOSIS — M7989 Other specified soft tissue disorders: Secondary | ICD-10-CM | POA: Diagnosis not present

## 2021-12-06 NOTE — Telephone Encounter (Signed)
Patient has been seen.

## 2021-12-07 ENCOUNTER — Inpatient Hospital Stay: Payer: Medicare Other | Attending: Hematology & Oncology

## 2021-12-07 VITALS — BP 107/58 | HR 76 | Temp 97.8°F | Resp 17

## 2021-12-07 DIAGNOSIS — D519 Vitamin B12 deficiency anemia, unspecified: Secondary | ICD-10-CM

## 2021-12-07 DIAGNOSIS — D510 Vitamin B12 deficiency anemia due to intrinsic factor deficiency: Secondary | ICD-10-CM | POA: Insufficient documentation

## 2021-12-07 DIAGNOSIS — D508 Other iron deficiency anemias: Secondary | ICD-10-CM

## 2021-12-07 MED ORDER — SODIUM CHLORIDE 0.9 % IV SOLN: Freq: Once | INTRAVENOUS | Status: AC

## 2021-12-07 MED ORDER — SODIUM CHLORIDE 0.9 % IV SOLN
510.0000 mg | Freq: Once | INTRAVENOUS | Status: AC
  Administered 2021-12-07: 510 mg via INTRAVENOUS
  Filled 2021-12-07: qty 510

## 2021-12-07 NOTE — Patient Instructions (Signed)

## 2021-12-07 NOTE — Progress Notes (Signed)
Pt declined to stay for post infusion observation period. Pt stated he has tolerated medication multiple times prior without difficulty. Pt aware to call clinic with any questions or concerns. Pt verbalized understanding and had no further questions.   

## 2021-12-13 ENCOUNTER — Telehealth (HOSPITAL_COMMUNITY): Payer: Self-pay

## 2021-12-13 NOTE — Telephone Encounter (Signed)
Detailed instructions left on the patient's answering machine. Asked to call back with any questions. S.Santi Troung EMTP 

## 2021-12-14 ENCOUNTER — Inpatient Hospital Stay: Payer: Medicare Other

## 2021-12-17 ENCOUNTER — Other Ambulatory Visit: Payer: Self-pay | Admitting: Physician Assistant

## 2021-12-17 ENCOUNTER — Other Ambulatory Visit: Payer: Self-pay | Admitting: *Deleted

## 2021-12-17 DIAGNOSIS — I25810 Atherosclerosis of coronary artery bypass graft(s) without angina pectoris: Secondary | ICD-10-CM

## 2021-12-17 NOTE — Addendum Note (Signed)
Addended by: Juventino Slovak on: 12/17/2021 08:16 AM   Modules accepted: Orders

## 2021-12-18 ENCOUNTER — Ambulatory Visit (HOSPITAL_COMMUNITY): Payer: Medicare Other | Attending: Internal Medicine

## 2021-12-18 ENCOUNTER — Ambulatory Visit (HOSPITAL_BASED_OUTPATIENT_CLINIC_OR_DEPARTMENT_OTHER): Payer: Medicare Other

## 2021-12-18 DIAGNOSIS — Z79899 Other long term (current) drug therapy: Secondary | ICD-10-CM | POA: Insufficient documentation

## 2021-12-18 DIAGNOSIS — Z952 Presence of prosthetic heart valve: Secondary | ICD-10-CM | POA: Insufficient documentation

## 2021-12-18 DIAGNOSIS — I25810 Atherosclerosis of coronary artery bypass graft(s) without angina pectoris: Secondary | ICD-10-CM | POA: Diagnosis not present

## 2021-12-18 DIAGNOSIS — R0609 Other forms of dyspnea: Secondary | ICD-10-CM | POA: Diagnosis not present

## 2021-12-18 DIAGNOSIS — R079 Chest pain, unspecified: Secondary | ICD-10-CM | POA: Diagnosis not present

## 2021-12-18 DIAGNOSIS — I4892 Unspecified atrial flutter: Secondary | ICD-10-CM | POA: Diagnosis not present

## 2021-12-18 DIAGNOSIS — R600 Localized edema: Secondary | ICD-10-CM | POA: Diagnosis not present

## 2021-12-18 LAB — MYOCARDIAL PERFUSION IMAGING
LV dias vol: 189 mL (ref 62–150)
LV sys vol: 128 mL
Nuc Stress EF: 32 %
Peak HR: 77 {beats}/min
Rest HR: 76 {beats}/min
Rest Nuclear Isotope Dose: 10.3 mCi
SDS: 1
SRS: 0
SSS: 1
ST Depression (mm): 0 mm
Stress Nuclear Isotope Dose: 30.3 mCi
TID: 1.07

## 2021-12-18 LAB — ECHOCARDIOGRAM COMPLETE
AR max vel: 1 cm2
AV Area VTI: 0.81 cm2
AV Area mean vel: 0.97 cm2
AV Mean grad: 9 mmHg
AV Peak grad: 15.7 mmHg
Ao pk vel: 1.98 m/s
Area-P 1/2: 4.36 cm2
Height: 72 in
MV M vel: 4.53 m/s
MV Peak grad: 82.1 mmHg
MV VTI: 0.91 cm2
Radius: 0.5 cm
S' Lateral: 4.6 cm
Weight: 4048 oz

## 2021-12-18 MED ORDER — REGADENOSON 0.4 MG/5ML IV SOLN
0.4000 mg | Freq: Once | INTRAVENOUS | Status: AC
  Administered 2021-12-18: 0.4 mg via INTRAVENOUS

## 2021-12-18 MED ORDER — TECHNETIUM TC 99M TETROFOSMIN IV KIT
30.6000 | PACK | Freq: Once | INTRAVENOUS | Status: AC | PRN
  Administered 2021-12-18: 30.6 via INTRAVENOUS

## 2021-12-18 MED ORDER — TECHNETIUM TC 99M TETROFOSMIN IV KIT
10.3000 | PACK | Freq: Once | INTRAVENOUS | Status: AC | PRN
  Administered 2021-12-18: 10.3 via INTRAVENOUS

## 2021-12-18 MED ORDER — PERFLUTREN LIPID MICROSPHERE
1.0000 mL | INTRAVENOUS | Status: AC | PRN
  Administered 2021-12-18: 6 mL via INTRAVENOUS

## 2021-12-20 ENCOUNTER — Other Ambulatory Visit: Payer: Self-pay | Admitting: Cardiology

## 2021-12-21 ENCOUNTER — Inpatient Hospital Stay: Payer: Medicare Other

## 2021-12-21 VITALS — BP 111/58 | HR 81 | Temp 97.8°F | Resp 18

## 2021-12-21 DIAGNOSIS — D508 Other iron deficiency anemias: Secondary | ICD-10-CM

## 2021-12-21 DIAGNOSIS — D510 Vitamin B12 deficiency anemia due to intrinsic factor deficiency: Secondary | ICD-10-CM | POA: Diagnosis not present

## 2021-12-21 DIAGNOSIS — D519 Vitamin B12 deficiency anemia, unspecified: Secondary | ICD-10-CM

## 2021-12-21 MED ORDER — SODIUM CHLORIDE 0.9 % IV SOLN
510.0000 mg | Freq: Once | INTRAVENOUS | Status: AC
  Administered 2021-12-21: 510 mg via INTRAVENOUS
  Filled 2021-12-21: qty 17

## 2021-12-21 MED ORDER — SODIUM CHLORIDE 0.9 % IV SOLN: INTRAVENOUS | Status: DC

## 2021-12-23 DIAGNOSIS — I871 Compression of vein: Secondary | ICD-10-CM | POA: Insufficient documentation

## 2021-12-24 ENCOUNTER — Telehealth: Payer: Self-pay | Admitting: Cardiology

## 2021-12-24 ENCOUNTER — Encounter: Payer: Self-pay | Admitting: Cardiology

## 2021-12-24 ENCOUNTER — Ambulatory Visit (INDEPENDENT_AMBULATORY_CARE_PROVIDER_SITE_OTHER): Payer: Medicare Other | Admitting: Cardiology

## 2021-12-24 VITALS — BP 118/60 | HR 78 | Ht 72.0 in | Wt 256.6 lb

## 2021-12-24 DIAGNOSIS — I25810 Atherosclerosis of coronary artery bypass graft(s) without angina pectoris: Secondary | ICD-10-CM | POA: Diagnosis not present

## 2021-12-24 DIAGNOSIS — E785 Hyperlipidemia, unspecified: Secondary | ICD-10-CM

## 2021-12-24 DIAGNOSIS — E118 Type 2 diabetes mellitus with unspecified complications: Secondary | ICD-10-CM | POA: Diagnosis not present

## 2021-12-24 DIAGNOSIS — I871 Compression of vein: Secondary | ICD-10-CM

## 2021-12-24 DIAGNOSIS — Z952 Presence of prosthetic heart valve: Secondary | ICD-10-CM | POA: Diagnosis not present

## 2021-12-24 DIAGNOSIS — I10 Essential (primary) hypertension: Secondary | ICD-10-CM

## 2021-12-24 MED ORDER — SACUBITRIL-VALSARTAN 24-26 MG PO TABS: 1.0000 | ORAL_TABLET | Freq: Two times a day (BID) | ORAL | 5 refills | Status: DC

## 2021-12-24 NOTE — Telephone Encounter (Signed)
Pt c/o medication issue:  1. Name of Medication: sacubitril-valsartan (ENTRESTO) 24-26 MG  2. How are you currently taking this medication (dosage and times per day)? Take 1 tablet by mouth 2 (two) times daily.  3. Are you having a reaction (difficulty breathing--STAT)? No   4. What is your medication issue? Patient called and said that the pharmacy  [id number] [place], [place] - [address]  Phone: [phone] would like for Dr. Antoine Poche or nurse to call to see if they need to refill the medication or not. Patient is allergic to sister of medication.

## 2021-12-24 NOTE — Telephone Encounter (Addendum)
Pt has angioedema documented with benazepril with note to not use ARBs either in the future, this should have flagged as high alert allergy when medication was ordered. Most recent BMET also shows K of 5.1. Recommend avoiding Entresto unless detailed risk/benefit discussion has been had with pt, will defer to MD. Instead, could change his metoprolol tartrate to metoprolol succinate for his HFrEF and increase his dose as able to target a HR of 60. Already on Farxiga, and K will likely prevent spironolactone from being started.

## 2021-12-25 ENCOUNTER — Telehealth: Payer: Self-pay | Admitting: Family Medicine

## 2021-12-25 ENCOUNTER — Ambulatory Visit (INDEPENDENT_AMBULATORY_CARE_PROVIDER_SITE_OTHER): Payer: Medicare Other | Admitting: Dermatology

## 2021-12-25 DIAGNOSIS — I25810 Atherosclerosis of coronary artery bypass graft(s) without angina pectoris: Secondary | ICD-10-CM

## 2021-12-25 DIAGNOSIS — C4442 Squamous cell carcinoma of skin of scalp and neck: Secondary | ICD-10-CM

## 2021-12-25 DIAGNOSIS — L578 Other skin changes due to chronic exposure to nonionizing radiation: Secondary | ICD-10-CM | POA: Diagnosis not present

## 2021-12-25 DIAGNOSIS — C44622 Squamous cell carcinoma of skin of right upper limb, including shoulder: Secondary | ICD-10-CM

## 2021-12-25 DIAGNOSIS — L570 Actinic keratosis: Secondary | ICD-10-CM

## 2021-12-25 DIAGNOSIS — D489 Neoplasm of uncertain behavior, unspecified: Secondary | ICD-10-CM

## 2021-12-25 DIAGNOSIS — C44329 Squamous cell carcinoma of skin of other parts of face: Secondary | ICD-10-CM

## 2021-12-25 DIAGNOSIS — C44320 Squamous cell carcinoma of skin of unspecified parts of face: Secondary | ICD-10-CM

## 2021-12-25 MED ORDER — AMLODIPINE BESYLATE 5 MG PO TABS: 5.0000 mg | ORAL_TABLET | Freq: Every day | ORAL | 3 refills | Status: DC

## 2021-12-25 NOTE — Telephone Encounter (Signed)
Left message for pt to call.

## 2021-12-25 NOTE — Progress Notes (Signed)
Follow-Up Visit   Subjective  Richard [name] is a 81 y.o. male who presents for the following: Actinic Keratosis (Patient reports a few spots behind right ear and spots at right forearm. Patient here today to have bx of right lateral cheek and right postauricular neck. Patient has hx of of multiple aks. ).   The patient has spots, moles and lesions to be evaluated, some may be new or changing and the patient has concerns that these could be cancer.   The following portions of the chart were reviewed this encounter and updated as appropriate:      Review of Systems: No other skin or systemic complaints except as noted in HPI or Assessment and Plan.   Objective  Well appearing patient in no apparent distress; mood and affect are within normal limits.  A focused examination was performed including right forearm, right hands, right postauricular area, right cheek, left and right neck and right zygoma, left cheek. Relevant physical exam findings are noted in the Assessment and Plan.  right lateral cheek 6 mm pink crusted papule           right postauricular neck 5 mm pink crusted papule        right forearm superior 7 mm pink keratotic nodule        right wrist 6 mm pink keratotic nodule        right thenar hand dorsum x 1, right zygoma x 3, left cheek x 5 (9) Erythematous thin papules/macules with gritty scale.    Assessment & Plan  Neoplasm of uncertain behavior (4) right lateral cheek  Epidermal / dermal shaving  Lesion diameter (cm):  0.6 Informed consent: discussed and consent obtained   Patient was prepped and draped in usual sterile fashion: Area prepped with alcohol. Anesthesia: the lesion was anesthetized in a standard fashion   Anesthetic:  1% lidocaine w/ epinephrine 1-100,000 buffered w/ 8.4% NaHCO3 Instrument used: flexible razor blade   Hemostasis achieved with: pressure and aluminum chloride   Outcome: patient tolerated procedure  well    Destruction of lesion  Destruction method: electrodesiccation and curettage   Informed consent: discussed and consent obtained   Timeout:  patient name, date of birth, surgical site, and procedure verified Curettage performed in three different directions: Yes   Electrodesiccation performed over the curetted area: Yes   Final wound size (cm):  0.9 Hemostasis achieved with:  electrodesiccation Outcome: patient tolerated procedure well with no complications   Post-procedure details: wound care instructions given   Additional details:  Mupirocin ointment and Bandaid applied   Specimen 1 - Surgical pathology Differential Diagnosis: R/o BCC/SCC  Check Margins: No  right postauricular neck  Epidermal / dermal shaving  Lesion diameter (cm):  0.5 Informed consent: discussed and consent obtained   Patient was prepped and draped in usual sterile fashion: Area prepped with alcohol. Anesthesia: the lesion was anesthetized in a standard fashion   Anesthetic:  1% lidocaine w/ epinephrine 1-100,000 buffered w/ 8.4% NaHCO3 Instrument used: flexible razor blade   Hemostasis achieved with: pressure and aluminum chloride   Outcome: patient tolerated procedure well    Destruction of lesion  Destruction method: electrodesiccation and curettage   Informed consent: discussed and consent obtained   Timeout:  patient name, date of birth, surgical site, and procedure verified Curettage performed in three different directions: Yes   Electrodesiccation performed over the curetted area: Yes   Final wound size (cm):  0.9 Hemostasis achieved with:  electrodesiccation Outcome:  patient tolerated procedure well with no complications   Post-procedure details: wound care instructions given   Additional details:  Mupirocin ointment and Bandaid applied   Specimen 2 - Surgical pathology Differential Diagnosis: R/o BCC/SCC  Check Margins: No  right forearm superior  Epidermal / dermal  shaving  Lesion diameter (cm):  0.7 Informed consent: discussed and consent obtained   Patient was prepped and draped in usual sterile fashion: Area prepped with alcohol. Anesthesia: the lesion was anesthetized in a standard fashion   Anesthetic:  1% lidocaine w/ epinephrine 1-100,000 buffered w/ 8.4% NaHCO3 Instrument used: flexible razor blade   Hemostasis achieved with: pressure and aluminum chloride   Outcome: patient tolerated procedure well    Destruction of lesion  Destruction method: electrodesiccation and curettage   Informed consent: discussed and consent obtained   Timeout:  patient name, date of birth, surgical site, and procedure verified Curettage performed in three different directions: Yes   Electrodesiccation performed over the curetted area: Yes   Final wound size (cm):  1.1 Hemostasis achieved with:  electrodesiccation Outcome: patient tolerated procedure well with no complications   Post-procedure details: wound care instructions given   Additional details:  Mupirocin ointment and Bandaid applied   Specimen 3 - Surgical pathology Differential Diagnosis: R/o BCC/SCC  Check Margins: No  right wrist  Epidermal / dermal shaving  Lesion diameter (cm):  0.6 Informed consent: discussed and consent obtained   Patient was prepped and draped in usual sterile fashion: Area prepped with alcohol. Anesthesia: the lesion was anesthetized in a standard fashion   Anesthetic:  1% lidocaine w/ epinephrine 1-100,000 buffered w/ 8.4% NaHCO3 Instrument used: flexible razor blade   Hemostasis achieved with: pressure and aluminum chloride   Outcome: patient tolerated procedure well    Destruction of lesion  Destruction method: electrodesiccation and curettage   Informed consent: discussed and consent obtained   Timeout:  patient name, date of birth, surgical site, and procedure verified Curettage performed in three different directions: Yes   Electrodesiccation performed  over the curetted area: Yes   Final wound size (cm):  1 Hemostasis achieved with:  electrodesiccation Outcome: patient tolerated procedure well with no complications   Post-procedure details: sterile dressing applied and wound care instructions given   Additional details:  Mupirocin ointment and Bandaid applied   Specimen 4 - Surgical pathology Differential Diagnosis: R/o BCC/SCC  Check Margins: No  R/o bcc/scc  Actinic keratosis (9) right thenar hand dorsum x 1, right zygoma x 3, left cheek x 5  Actinic keratoses are precancerous spots that appear secondary to cumulative UV radiation exposure/sun exposure over time. They are chronic with expected duration over 1 year. A portion of actinic keratoses will progress to squamous cell carcinoma of the skin. It is not possible to reliably predict which spots will progress to skin cancer and so treatment is recommended to prevent development of skin cancer.  Recommend daily broad spectrum sunscreen SPF 30+ to sun-exposed areas, reapply every 2 hours as needed.  Recommend staying in the shade or wearing long sleeves, sun glasses (UVA+UVB protection) and wide brim hats (4-inch brim around the entire circumference of the hat). Call for new or changing lesions.  Destruction of lesion - right thenar hand dorsum x 1, right zygoma x 3, left cheek x 5  Destruction method: cryotherapy   Informed consent: discussed and consent obtained   Lesion destroyed using liquid nitrogen: Yes   Region frozen until ice ball extended beyond lesion: Yes   Outcome:  patient tolerated procedure well with no complications   Post-procedure details: wound care instructions given   Additional details:  Prior to procedure, discussed risks of blister formation, small wound, skin dyspigmentation, or rare scar following cryotherapy. Recommend Vaseline ointment to treated areas while healing.   Actinic Damage - chronic, secondary to cumulative UV radiation exposure/sun  exposure over time - diffuse scaly erythematous macules with underlying dyspigmentation - Recommend daily broad spectrum sunscreen SPF 30+ to sun-exposed areas, reapply every 2 hours as needed.  - Recommend staying in the shade or wearing long sleeves, sun glasses (UVA+UVB protection) and wide brim hats (4-inch brim around the entire circumference of the hat). - Call for new or changing lesions.   Return in about 3 months (around 03/27/2022) for ak followup and recheck bx's. I, Ruthell Rummage, CMA, am acting as scribe for Brendolyn Patty, MD.  Documentation: I have reviewed the above documentation for accuracy and completeness, and I agree with the above.  Brendolyn Patty MD

## 2021-12-25 NOTE — Telephone Encounter (Signed)
Pt stated sore throat has not gotten better and he has started to cough up blood. Warm transferred to triage.

## 2021-12-25 NOTE — Telephone Encounter (Signed)
Pt called stating triage nurse rec'd being seen within 24 hrs. Pt scheduled.

## 2021-12-26 ENCOUNTER — Telehealth: Payer: Self-pay | Admitting: Cardiology

## 2021-12-26 ENCOUNTER — Ambulatory Visit (INDEPENDENT_AMBULATORY_CARE_PROVIDER_SITE_OTHER): Payer: Medicare Other | Admitting: Family

## 2021-12-26 ENCOUNTER — Encounter: Payer: Self-pay | Admitting: Family

## 2021-12-26 ENCOUNTER — Ambulatory Visit (HOSPITAL_BASED_OUTPATIENT_CLINIC_OR_DEPARTMENT_OTHER)
Admission: RE | Admit: 2021-12-26 | Discharge: 2021-12-26 | Disposition: A | Discharge status: Home / Self Care | Payer: Medicare Other | Source: Ambulatory Visit | Attending: Family | Admitting: Family

## 2021-12-26 VITALS — BP 112/68 | HR 71 | Temp 97.7°F | Resp 16 | Ht 72.0 in | Wt 257.4 lb

## 2021-12-26 DIAGNOSIS — I10 Essential (primary) hypertension: Secondary | ICD-10-CM

## 2021-12-26 DIAGNOSIS — R042 Hemoptysis: Secondary | ICD-10-CM | POA: Insufficient documentation

## 2021-12-26 DIAGNOSIS — I25810 Atherosclerosis of coronary artery bypass graft(s) without angina pectoris: Secondary | ICD-10-CM

## 2021-12-26 NOTE — Patient Instructions (Signed)
Please complete chest x-ray on the first floor. Your should be contacted about your referral to Dr. Elsworth Soho. Go ER if you notice increased blood in your phlegm.

## 2021-12-26 NOTE — Assessment & Plan Note (Signed)
New.  Obtain CXR and refer back to his pulmonologist, Dr. Elsworth Soho for further evaluation.

## 2021-12-26 NOTE — Assessment & Plan Note (Signed)
BP Readings from Last 3 Encounters:  12/26/21 112/68  12/24/21 118/60  12/21/21 (!) 111/58   It looks like previous amlodipine dosing was '5mg'$  bid. He is advised to continue at this dose unless he hears otherwise from Dr. Percival Spanish.

## 2021-12-26 NOTE — Telephone Encounter (Signed)
Called patient left message on personal voice mail I will send message to Dr.Hochrein to make sure he wants you to take Norvasc 5 mg twice a day.

## 2021-12-26 NOTE — Telephone Encounter (Signed)
Spoke to patient he stated he use to take Norvasc 5 mg twice a day.Advised he should continue taking 5 mg twice a day.

## 2021-12-26 NOTE — Telephone Encounter (Signed)
Pt c/o medication issue:  1. Name of Medication: amLODipine (NORVASC) 5 MG tablet  2. How are you currently taking this medication (dosage and times per day)? Twice daily.  3. Are you having a reaction (difficulty breathing--STAT)? No  4. What is your medication issue? Pt is calling stating that triage told him that he is suppose to be taking this medication 1x daily, and that is the way it is stated on medication profile, but patient states that he is suppose to be taking this 2 x's daily and according to Dr. Percival Spanish from past correspondence on 06/26. Pt is just wanting this to reflect correctly so there is no confusion in the future.

## 2021-12-26 NOTE — Telephone Encounter (Signed)
Spoke to patient he stated he never started taking Entresto.He is taking Norvasc 5 mg daily.He will keep appointment scheduled with Dr.Hochrein 02/08/22 at 7:40 am.Advised to call sooner if needed.

## 2021-12-26 NOTE — Progress Notes (Signed)
Subjective:   By signing my name below, I, Carylon Perches, attest that this documentation has been prepared under the direction and in the presence of [name], NP 12/26/2021    Patient ID: Richard [name], male    DOB: 1940-08-14, 81 y.o.   MRN: [id number]  Chief Complaint  Patient presents with   coughing up blood in morning and sometimes at night    Was recently dx with strep    HPI Patient is in today for an office visit  Coughing up blood: He complains of coughing up blood which he noticed appeared after developing strep throat. He notes the blood is streaked. He notices in the morning, the blood is darker in color. He reports that he had Covid in March and strep throat in May. He denies of any sore throat. He is currently taking 2.5 Mg of Eliquis. Amlodipine: He reports that he has been taking 2 tablets of 5 Mg of Amlodipine. However, Dr. Rosezella [place] nurse informed him that he only needed to take one tablet. He remembers being advised to take 2 and is continuing to do so.    Health Maintenance Due  Topic Date Due   COVID-19 Vaccine (4 - Booster for Moderna series) 06/16/2020    Past Medical History:  Diagnosis Date   Anemia    Arthritis    hips   Axillary adenopathy 02/25/2017   Bradycardia    a. holter monitor has demonstrated HRs in 30s and [place]    CAD (coronary artery disease)    a. s/p CABG 2001  b.  07/28/2017 cath:   Severe three-vessel native CAD with total occlusion of LAD, ramus intermedius, first OM and RCA, patent RIMA to PDA, LIMA to LAD, sequential SVG to ramus intermedius and first OM.     Chronic lower back pain    Diffuse large B cell lymphoma (HCC)    Diverticulosis    Esophageal stricture    GERD (gastroesophageal reflux disease)    History of gout    HTN (hypertension)    Mixed hyperlipidemia    OSA on CPAP    with 2L O2 at night   Pancytopenia ([place])    a. related to chemo therapy for B cell lymphoma   Peptic stricture of  esophagus    Presence of permanent cardiac pacemaker    sees Dr. Valaria Good pacemaker   SCC (squamous cell carcinoma) 01/31/2021   R zygoma, [facility]   SCC (squamous cell carcinoma) 01/31/2021   L post ankle, [facility]   SCC (squamous cell carcinoma) 01/31/2021   L popliteal, ED[facility]   Severe aortic stenosis    Spinal stenosis    Squamous cell carcinoma of skin 03/22/2009   Right mandible. SCCis, hypertrophic.    Type II diabetes mellitus ([place])    Wears dentures    partial upper    Past Surgical History:  Procedure Laterality Date   APPENDECTOMY  ~ [place] Left 11/06/2016   Procedure: CATARACT EXTRACTION PHACO AND INTRAOCULAR LENS PLACEMENT (IOC);  Surgeon: Estill Cotta, MD;  Location: [facility];  Service: Ophthalmology;  Laterality: Left;  Lot # [id number] H [place]: 01:09.4 AP%:25.2 CDE: 30.64   CATARACT EXTRACTION W/PHACO Right 12/04/2016   Procedure: CATARACT EXTRACTION PHACO AND INTRAOCULAR LENS PLACEMENT (IOC);  Surgeon: Estill Cotta, MD;  Location: [facility];  Service: Ophthalmology;  Laterality: Right;  [place] 1:25.9 AP% 24.1 CDE 39.10 Fluid Pack lot # [id number]   COLONOSCOPY  W/ BIOPSIES AND POLYPECTOMY  07/02/2011   CORONARY ANGIOPLASTY  07/02/1991   CORONARY ANGIOPLASTY WITH STENT PLACEMENT  05/31/1997   "1"   CORONARY ARTERY BYPASS GRAFT  03/01/2000   "CABG X5"   ECTROPION REPAIR Right 09/01/2018   Procedure: REPAIR OF ECTROPION BILATERAL upper and lower;  Surgeon: Karle Starch, MD;  Location: [place];  Service: Ophthalmology;  Laterality: Right;  Diabetic - oral meds sleep apnea   ESOPHAGEAL DILATION  X 3-4   Dr. Lyla Son; "last one was in the 1990's"   ESOPHAGOGASTRODUODENOSCOPY     multiple   FLEXIBLE SIGMOIDOSCOPY     multiple   HYDRADENITIS EXCISION Left 02/25/2017   Procedure: EXCISION DEEP LEFT AXILLARY LYMPH NODE;  Surgeon: Fanny Skates, MD;  Location: [place];  Service: General;  Laterality: Left;    INTRAOPERATIVE TRANSTHORACIC ECHOCARDIOGRAM N/A 09/09/2017   Procedure: INTRAOPERATIVE TRANSTHORACIC ECHOCARDIOGRAM;  Surgeon: Burnell Blanks, MD;  Location: [place];  Service: Open Heart Surgery;  Laterality: N/A;   KNEE ARTHROSCOPY Left 07/01/2009   meniscus repair   LEFT HEART CATHETERIZATION WITH CORONARY/GRAFT ANGIOGRAM N/A 03/16/2014   Procedure: LEFT HEART CATHETERIZATION WITH Beatrix Fetters;  Surgeon: Burnell Blanks, MD;  Location: [facility] C[facility];  Service: Cardiovascular;  Laterality: N/A;   LUMBAR LAMINECTOMY/DECOMPRESSION MICRODISCECTOMY Right 06/17/2013   Procedure: LUMBAR LAMINECTOMY MICRODISCECTOMY L4-L5 RIGHT EXCISION OF SYNOVIAL CYST RIGHT   (1 LEVEL) RIGHT PARTIAL FACETECTOMY;  Surgeon: Tobi Bastos, MD;  Location: [facility];  Service: Orthopedics;  Laterality: Right;   MYELOGRAM  04/06/2013   lumbar, Dr Gladstone Lighter   ORBITAL LESION EXCISION Right 09/01/2018   Procedure: ORBITOTOMY WITHOUT BONE FLAP WITH REMOVAL OF LESION RIGHT;  Surgeon: Karle Starch, MD;  Location: [place];  Service: Ophthalmology;  Laterality: Right;   PACEMAKER IMPLANT N/A 09/10/2017   Procedure: PACEMAKER IMPLANT;  Surgeon: Deboraha Sprang, MD;  Location: [place] [facility];  Service: Cardiovascular;  Laterality: N/A;   PANENDOSCOPY     PERIPHERAL VASCULAR BALLOON ANGIOPLASTY Left 03/16/2021   Procedure: PERIPHERAL VASCULAR BALLOON ANGIOPLASTY;  Surgeon: [name], MD;  Location: [place] [facility];  Service: Cardiovascular;  Laterality: Left;  subclavian  vein   PORTACATH PLACEMENT N/A 03/06/2017   Procedure: INSERTION PORT-A-CATH AND ASPIRATE SEROMA LEFT AXILLA;  Surgeon: Fanny Skates, MD;  Location: [place];  Service: General;  Laterality: N/A;   RIGHT/LEFT HEART CATH AND CORONARY/GRAFT ANGIOGRAPHY N/A 07/28/2017   Procedure: RIGHT/LEFT HEART CATH AND CORONARY/GRAFT ANGIOGRAPHY;  Surgeon: Sherren Mocha, MD;  Location: [place] [facility];  Service: Cardiovascular;   Laterality: N/A;   SHOULDER SURGERY Right 08/30/2010   screws placed; "tendons tore off"   SKIN CANCER EXCISION Right    "neck"   TONSILLECTOMY  ~ [place] Left 06/29/2019   Procedure: TOTAL KNEE ARTHROPLASTY;  Surgeon: Paralee Cancel, MD;  Location[facility];  Service: Orthopedics;  Laterality: Left;  70 mins   TRANSCATHETER AORTIC VALVE REPLACEMENT, TRANSFEMORAL N/A 09/09/2017   Procedure: TRANSCATHETER AORTIC VALVE REPLACEMENT, TRANSFEMORAL;  Surgeon: Burnell Blanks, MD;  Location: [place];  Service: Open Heart Surgery;  Laterality: N/A;   UPPER EXTREMITY VENOGRAPHY Left 02/15/2021   Procedure: CENTRAL VENO;  Surgeon: [name], MD;  Location: [place] [facility];  Service: Cardiovascular;  Laterality: Left;   UPPER EXTREMITY VENOGRAPHY N/A 03/16/2021   Procedure: UPPER EXTREMITY VENOGRAPHY;  Surgeon: [name], MD;  Location: [place] [facility];  Service: Cardiovascular;  Laterality: N/A;   UPPER GI ENDOSCOPY  07/02/2011   Gastritis; Dr Carlean Purl   VASECTOMY     wireless pacemaker placed      Family History  Problem Relation Age of Onset   Stroke Father    Hypertension Father    Pancreatic cancer Mother    Diabetes Maternal Grandmother    Stroke Maternal Grandmother    Heart attack Paternal Grandmother    Colon cancer Neg Hx    Esophageal cancer Neg Hx    Rectal cancer Neg Hx    Stomach cancer Neg Hx    Ulcers Neg Hx     Social History   Socioeconomic History   Marital status: Divorced    Spouse name: Not on file   Number of children: 2   Years of education: college   Highest education level: Not on file  Occupational History    Employer: RETIRED  Tobacco Use   Smoking status: Never   Smokeless tobacco: Never  Vaping Use   Vaping Use: Never used  Substance and Sexual Activity   Alcohol use: No    Alcohol/week: 0.0 standard drinks of alcohol    Comment: "last drink was in 2012"( 03/15/2014)   Drug use: No   Sexual activity:  Not Currently  Other Topics Concern   Not on file  Social History Narrative   ** Merged History Encounter **       Divorced, lives with a roommate. 1 son one daughter 3-4 caffeinated beverages daily Right-handed. 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   Social Determinants of Health   Financial Resource Strain: Low Risk  (06/19/2021)   Overall Financial Resource Strain (CARDIA)    Difficulty of Paying Living Expenses: Not very hard  Food Insecurity: No Food Insecurity (09/06/2021)   Hunger Vital Sign    Worried About Running Out of Food in the Last Year: Never true    Ran Out of Food in the Last Year: Never true  Transportation Needs: No Transportation Needs (09/06/2021)   PRAPARE - Hydrologist (Medical): No    Lack of Transportation (Non-Medical): No  Physical Activity: Sufficiently Active (06/19/2021)   Exercise Vital Sign    Days of Exercise per Week: 5 days    Minutes of Exercise per Session: 30 min  Stress: Not on file  Social Connections: Moderately Isolated (09/06/2021)   Social Connection and Isolation Panel [NHANES]    Frequency of Communication with Friends and Family: More than three times a week    Frequency of Social Gatherings with Friends and Family: More than three times a week    Attends Religious Services: More than 4 times per year    Active Member of [employer] or Organizations: No    Attends Archivist Meetings: Never    Marital Status: Divorced  Human resources officer Violence: Not At Risk (09/06/2021)   Humiliation, Afraid, Rape, and Kick questionnaire    Fear of Current or Ex-Partner: No    Emotionally Abused: No    Physically Abused: No    Sexually Abused: No    Outpatient Medications Prior to Visit  Medication Sig Dispense Refill   acetaminophen (TYLENOL) 325 MG tablet Take 650 mg by mouth at  bedtime as needed for moderate pain or headache.     allopurinol (ZYLOPRIM) 300 MG tablet Take 450 mg by  mouth daily.     aluminum hydroxide-magnesium carbonate (GAVISCON) 95-358 MG/15ML SUSP Take 15 mLs by mouth as needed for indigestion or heartburn.     amLODipine (NORVASC) 5 MG tablet Take 1 tablet (5 mg total) by mouth daily. 180 tablet 3   apixaban (ELIQUIS) 2.5 MG TABS tablet Take 1 tablet (2.5 mg total) by mouth 2 (two) times daily. 180 tablet 0   atorvastatin (LIPITOR) 80 MG tablet TAKE 1 TABLET BY MOUTH EVERYDAY AT BEDTIME 90 tablet 3   azelastine (ASTELIN) 0.1 % nasal spray PLACE 2 SPRAYS INTO BOTH NOSTRILS AT BEDTIME AS NEEDED FOR RHINITIS OR ALLERGIES. 30 mL 4   dapagliflozin propanediol (FARXIGA) 10 MG TABS tablet Take 10 mg by mouth daily.     esomeprazole (NEXIUM) 40 MG capsule Take 1 capsule (40 mg total) by mouth daily. (Patient taking differently: Take 20 mg by mouth daily.) 30 capsule 5   eye wash (,SODIUM/POTASSIUM/SOD CHLORIDE,) SOLN Place 1 drop into both eyes at bedtime. [facility] Soothing Eye Wash     ezetimibe (ZETIA) 10 MG tablet TAKE 1 TABLET BY MOUTH EVERY DAY 90 tablet 3   fenofibrate 160 MG tablet TAKE 1 TABLET BY MOUTH EVERY DAY 90 tablet 0   folic acid (FOLVITE) 1 MG tablet TAKE 2 TABLETS BY MOUTH EVERY DAY 180 tablet 4   FREESTYLE LITE test strip USE TO TEST BLOOD SUGAR ONCE A DAY. DX CODE: E11.9 100 strip 12   glimepiride (AMARYL) 1 MG tablet TAKE 1 TABLET BY MOUTH EVERY DAY WITH BREAKFAST 90 tablet 1   icosapent Ethyl (VASCEPA) 1 g capsule TAKE 2 CAPSULES BY MOUTH TWICE A DAY 360 capsule 1   KLOR-CON M20 20 MEQ tablet TAKE 2 TABLETS (40 MEQ TOTAL) BY MOUTH 2 (TWO) TIMES DAILY. 360 tablet 3   Lancets (FREESTYLE) lancets USE ONCE A DAY TO CHECK BLOOD SUGAR. 100 each 12   Menthol-Camphor (ICY HOT ADVANCED PAIN RELIEF EX) Apply 1 application topically daily. Roll-on     metFORMIN (GLUCOPHAGE) 1000 MG tablet TAKE 1 & 1/2 TABLETS BY MOUTH EVERY MORNING AND 1 TABLET IN  THE EVENING. 225 tablet 1   metoprolol tartrate (LOPRESSOR) 25 MG tablet TAKE 1/2 TABLET BY MOUTH TWICE DAILY 90 tablet 3   nitroGLYCERIN (NITROSTAT) 0.4 MG SL tablet PLACE 1 TABLET (0.4 MG TOTAL) UNDER THE TONGUE EVERY 5 (FIVE) MINUTES AS NEEDED FOR CHEST PAIN. 25 tablet 6   pregabalin (LYRICA) 150 MG capsule Take 1 capsule (150 mg total) by mouth 2 (two) times daily. 180 capsule 1   psyllium (METAMUCIL) 58.6 % powder Take 1 packet by mouth daily as needed (constipation).     torsemide (DEMADEX) 20 MG tablet Take 20 mg by mouth daily.     No facility-administered medications prior to visit.    Allergies  Allergen Reactions   Ace Inhibitors Swelling   Benazepril Swelling    angioedema; he is not a candidate for any angiotensin receptor blockers because of this significant allergic reaction. Because of a history of documented adverse serious drug reaction;Medi Alert bracelet  is recommended   Entresto [Sacubitril-Valsartan]    Hctz [Hydrochlorothiazide] Anaphylaxis and Swelling    Tongue and lip swelling    Benazepril Hcl Other (See Comments)   Aspirin Other (See Comments)    Gastritis, cant take 325 Mg aspirin    Lactose Intolerance (Gi) Nausea And Vomiting    Review of Systems  HENT:  Negative for sore throat.   Respiratory:  Positive for cough (w/  blood).        Objective:    Physical Exam Constitutional:      General: He is not in acute distress.    Appearance: Normal appearance. He is not ill-appearing.  HENT:     Head: Normocephalic and atraumatic.     Right Ear: Tympanic membrane, ear canal and external ear normal.     Left Ear: Tympanic membrane, ear canal and external ear normal. There is impacted cerumen.     Mouth/Throat:     Comments: Tonsils Surgically absent   Eyes:     Extraocular Movements: Extraocular movements intact.     Pupils: Pupils are equal, round, and reactive to light.  Cardiovascular:     Rate and Rhythm: Normal rate and regular rhythm.      Heart sounds: Normal heart sounds. No murmur heard.    No gallop.  Pulmonary:     Effort: No respiratory distress.     Breath sounds: Normal breath sounds. No wheezing or rales.  Skin:    General: Skin is warm and dry.  Neurological:     Mental Status: He is alert and oriented to person, place, and time.  Psychiatric:        Mood and Affect: Mood normal.        Behavior: Behavior normal.        Judgment: Judgment normal.     BP 112/68 (BP Location: Right Arm, Cuff Size: Large)   Pulse 71   Temp 97.7 F (36.5 C) (Oral)   Resp 16   Ht 6' (1.829 m)   Wt 257 lb 6.4 oz (116.8 kg)   SpO2 96%   BMI 34.91 kg/m  Wt Readings from Last 3 Encounters:  12/26/21 257 lb 6.4 oz (116.8 kg)  12/24/21 256 lb 9.6 oz (116.4 kg)  12/18/21 253 lb (114.8 kg)       Assessment & Plan:   Problem List Items Addressed This Visit       Unprioritized   Hemoptysis - Primary    New.  Obtain CXR and refer back to his pulmonologist, Dr. Elsworth Soho for further evaluation.       Relevant Orders   Ambulatory referral to Pulmonology   DG Chest 2 View (Completed)   Essential hypertension    BP Readings from Last 3 Encounters:  12/26/21 112/68  12/24/21 118/60  12/21/21 (!) 111/58  It looks like previous amlodipine dosing was '5mg'$  bid. He is advised to continue at this dose unless he hears otherwise from Dr. Percival Spanish.         No orders of the defined types were placed in this encounter.   I, Nance Pear, NP, personally preformed the services described in this documentation.  All medical record entries made by the scribe were at my direction and in my presence.  I have reviewed the chart and discharge instructions (if applicable) and agree that the record reflects my personal performance and is accurate and complete. 12/26/2021   I,Amber Collins,acting as a scribe for Nance Pear, NP.,have documented all relevant documentation on the behalf of Nance Pear, NP,as directed by   Nance Pear, NP while in the presence of Nance Pear, NP.  Nance Pear, NP

## 2021-12-27 ENCOUNTER — Other Ambulatory Visit: Payer: Self-pay | Admitting: Cardiology

## 2021-12-28 ENCOUNTER — Inpatient Hospital Stay: Payer: Medicare Other

## 2021-12-28 ENCOUNTER — Telehealth: Payer: Self-pay | Admitting: *Deleted

## 2021-12-28 ENCOUNTER — Inpatient Hospital Stay (HOSPITAL_BASED_OUTPATIENT_CLINIC_OR_DEPARTMENT_OTHER): Payer: Medicare Other | Admitting: Family

## 2021-12-28 ENCOUNTER — Other Ambulatory Visit: Payer: Self-pay

## 2021-12-28 ENCOUNTER — Encounter: Payer: Self-pay | Admitting: Family

## 2021-12-28 VITALS — BP 106/66 | HR 75 | Temp 97.8°F | Resp 20 | Ht 72.0 in | Wt 258.0 lb

## 2021-12-28 DIAGNOSIS — D508 Other iron deficiency anemias: Secondary | ICD-10-CM

## 2021-12-28 DIAGNOSIS — D519 Vitamin B12 deficiency anemia, unspecified: Secondary | ICD-10-CM

## 2021-12-28 DIAGNOSIS — C8332 Diffuse large B-cell lymphoma, intrathoracic lymph nodes: Secondary | ICD-10-CM

## 2021-12-28 DIAGNOSIS — D500 Iron deficiency anemia secondary to blood loss (chronic): Secondary | ICD-10-CM

## 2021-12-28 DIAGNOSIS — D510 Vitamin B12 deficiency anemia due to intrinsic factor deficiency: Secondary | ICD-10-CM | POA: Diagnosis not present

## 2021-12-28 LAB — CMP (CANCER CENTER ONLY)
ALT: 12 U/L (ref 0–44)
AST: 12 U/L — ABNORMAL LOW (ref 15–41)
Albumin: 4.6 g/dL (ref 3.5–5.0)
Alkaline Phosphatase: 59 U/L (ref 38–126)
Anion gap: 10 (ref 5–15)
BUN: 41 mg/dL — ABNORMAL HIGH (ref 8–23)
CO2: 25 mmol/L (ref 22–32)
Calcium: 10 mg/dL (ref 8.9–10.3)
Chloride: 106 mmol/L (ref 98–111)
Creatinine: 2.17 mg/dL — ABNORMAL HIGH (ref 0.61–1.24)
GFR, Estimated: 29 mL/min — ABNORMAL LOW (ref >60)
Glucose, Bld: 93 mg/dL (ref 70–99)
Potassium: 5 mmol/L (ref 3.5–5.1)
Sodium: 141 mmol/L (ref 135–145)
Total Bilirubin: 0.8 mg/dL (ref 0.3–1.2)
Total Protein: 6.7 g/dL (ref 6.5–8.1)

## 2021-12-28 LAB — CBC WITH DIFFERENTIAL (CANCER CENTER ONLY)
Abs Immature Granulocytes: 0.06 10*3/uL (ref 0.00–0.07)
Basophils Absolute: 0.1 10*3/uL (ref 0.0–0.1)
Basophils Relative: 1 %
Eosinophils Absolute: 0.1 10*3/uL (ref 0.0–0.5)
Eosinophils Relative: 2 %
HCT: 38.1 % — ABNORMAL LOW (ref 39.0–52.0)
Hemoglobin: 11.8 g/dL — ABNORMAL LOW (ref 13.0–17.0)
Immature Granulocytes: 1 %
Lymphocytes Relative: 15 %
Lymphs Abs: 1.2 10*3/uL (ref 0.7–4.0)
MCH: 28.4 pg (ref 26.0–34.0)
MCHC: 31 g/dL (ref 30.0–36.0)
MCV: 91.8 fL (ref 80.0–100.0)
Monocytes Absolute: 0.7 10*3/uL (ref 0.1–1.0)
Monocytes Relative: 9 %
Neutro Abs: 6.1 10*3/uL (ref 1.7–7.7)
Neutrophils Relative %: 72 %
Platelet Count: 119 10*3/uL — ABNORMAL LOW (ref 150–400)
RBC: 4.15 MIL/uL — ABNORMAL LOW (ref 4.22–5.81)
RDW: 18.4 % — ABNORMAL HIGH (ref 11.5–15.5)
WBC Count: 8.2 10*3/uL (ref 4.0–10.5)
nRBC: 0 % (ref 0.0–0.2)

## 2021-12-28 LAB — VITAMIN B12: Vitamin B-12: 177 pg/mL — ABNORMAL LOW (ref 180–914)

## 2021-12-28 LAB — RETICULOCYTES
Immature Retic Fract: 32.6 % — ABNORMAL HIGH (ref 2.3–15.9)
RBC.: 4.11 MIL/uL — ABNORMAL LOW (ref 4.22–5.81)
Retic Count, Absolute: 80.1 10*3/uL (ref 19.0–186.0)
Retic Ct Pct: 2 % (ref 0.4–3.1)

## 2021-12-28 LAB — FERRITIN: Ferritin: 1374 ng/mL — ABNORMAL HIGH (ref 24–336)

## 2021-12-28 LAB — LACTATE DEHYDROGENASE: LDH: 158 U/L (ref 98–192)

## 2021-12-28 MED ORDER — CYANOCOBALAMIN 1000 MCG/ML IJ SOLN
1000.0000 ug | Freq: Once | INTRAMUSCULAR | Status: AC
  Administered 2021-12-28: 1000 ug via INTRAMUSCULAR
  Filled 2021-12-28: qty 1

## 2021-12-28 NOTE — Patient Instructions (Signed)
Vitamin B12 Deficiency Vitamin B12 deficiency means that your body does not have enough vitamin B12. The body needs this important vitamin: To make red blood cells. To make genes (DNA). To help the nerves work. If you do not have enough vitamin B12 in your body, you can have health problems, such as not having enough red blood cells in the blood (anemia). What are the causes? Not eating enough foods that contain vitamin B12. Not being able to take in (absorb) vitamin B12 from the food that you eat. Certain diseases. A condition in which the body does not make enough of a certain protein. This results in your body not taking in enough vitamin B12. Having a surgery in which part of the stomach or small intestine is taken out. Taking medicines that make it hard for the body to take in vitamin B12. These include: Heartburn medicines. Some medicines that are used to treat diabetes. What increases the risk? Being an older adult. Eating a vegetarian or vegan diet that does not include any foods that come from animals. Not eating enough foods that contain vitamin B12 while you are pregnant. Taking certain medicines. Having alcoholism. What are the signs or symptoms? In some cases, there are no symptoms. If the condition leads to too few blood cells or nerve damage, symptoms can occur, such as: Feeling weak or tired. Not being hungry. Losing feeling (numbness) or tingling in your hands and feet. Redness and burning of the tongue. Feeling sad (depressed). Confusion or memory problems. Trouble walking. If anemia is very bad, symptoms can include: Being short of breath. Being dizzy. Having a very fast heartbeat. How is this treated? Changing the way you eat and drink, such as: Eating more foods that contain vitamin B12. Drinking little or no alcohol. Getting vitamin B12 shots. Taking vitamin B12 supplements by mouth (orally). Your doctor will tell you the dose that is best for you. Follow  these instructions at home: Eating and drinking  Eat foods that come from animals and have a lot of vitamin B12 in them. These include: Meats and poultry. This includes beef, pork, chicken, turkey, and organ meats, such as liver. Seafood, such as clams, rainbow trout, salmon, tuna, and haddock. Eggs. Dairy foods such as milk, yogurt, and cheese. Eat breakfast cereals that have vitamin B12 added to them (are fortified). Check the label. The items listed above may not be a complete list of foods and beverages you can eat and drink. Contact a dietitian for more information. Alcohol use Do not drink alcohol if: Your doctor tells you not to drink. You are pregnant, may be pregnant, or are planning to become pregnant. If you drink alcohol: Limit how much you have to: 0-1 drink a day for women. 0-2 drinks a day for men. Know how much alcohol is in your drink. In the [place] one drink equals one 12 oz bottle of beer (355 mL), one 5 oz glass of wine (148 mL), or one 1 oz glass of hard liquor (44 mL). General instructions Get any vitamin B12 shots if told by your doctor. Take supplements only as told by your doctor. Follow the directions. Keep all follow-up visits. Contact a doctor if: Your symptoms come back. Your symptoms get worse or do not get better with treatment. Get help right away if: You have trouble breathing. You have a very fast heartbeat. You have chest pain. You get dizzy. You faint. These symptoms may be an emergency. Get help right away. Call 911.   Do not wait to see if the symptoms will go away. Do not drive yourself to the hospital. Summary Vitamin B12 deficiency means that your body is not getting enough of the vitamin. In some cases, there are no symptoms of this condition. Treatment may include making a change in the way you eat and drink, getting shots, or taking supplements. Eat foods that have vitamin B12 in them. This information is not intended to replace advice  given to you by your health care provider. Make sure you discuss any questions you have with your health care provider. Document Revised: 02/09/2021 Document Reviewed: 02/09/2021 Elsevier Patient Education  2023 [employer]  

## 2021-12-28 NOTE — Progress Notes (Signed)
Hematology and Oncology Follow Up Visit  Richard [name] [id number] 04/21/1941 81 y.o. 12/28/2021   Principle Diagnosis:  Diffuse large cell non-Hodgkin's lymphoma (IPI = 3) - NOT "double hit" Pernicious anemia Iron deficiency secondary to bleeding   Past Therapy: R-CHOP - s/p cycle 8 - completed 08/2017   Current Therapy:        Vitamin B12 1 mg IM every month Xgeva 120 mg subcu q 3 months - next dose due in 12/2021 IV iron as indicated      Interim History:  Richard [name] is here today for follow-up and B 12 injection. He recently had an ECHO and stress test while revealed him to have an EF of 30-35%. He notes some fatigue and SOB with exertion.  He has only been able to play golf once in 3 weeks and could only play every other hole.  His cardiologist has spoken with Dr. Caryl Comes and per the patient it sounds as though they feel his current pacemaker may be a contributing factor.  He has occasional lightheadedness when he stands which has attributes to lyrica and antihypertensive medication.  No falls or syncope reported.  No fever, chills, n/v, cough, rash, chest pain, palpitations, abdominal pain or changes in bowel or bladder habits.  The swelling in his left arms appears to be much improved. He states that he did not have to have a stent placed. He is wearing a compression sleeve with glove.  Neuropathy in his feet is unchanged from baseline.  Appetite and hydration have been good. He is avoiding salt. His weight is   ECOG Performance Status: 1 - Symptomatic but completely ambulatory  Medications:  Allergies as of 12/28/2021       Reactions   Ace Inhibitors Swelling   Benazepril Swelling   angioedema; he is not a candidate for any angiotensin receptor blockers because of this significant allergic reaction. Because of a history of documented adverse serious drug reaction;Medi Alert bracelet  is recommended   Entresto [sacubitril-valsartan]    Hctz [hydrochlorothiazide]  Anaphylaxis, Swelling   Tongue and lip swelling   Benazepril Hcl Other (See Comments)   Aspirin Other (See Comments)   Gastritis, cant take 325 Mg aspirin    Lactose Intolerance (gi) Nausea And Vomiting        Medication List        Accurate as of December 28, 2021  1:54 PM. If you have any questions, ask your nurse or doctor.          acetaminophen 325 MG tablet Commonly known as: TYLENOL Take 650 mg by mouth at bedtime as needed for moderate pain or headache.   allopurinol 300 MG tablet Commonly known as: ZYLOPRIM Take 450 mg by mouth daily.   aluminum hydroxide-magnesium carbonate 95-358 MG/15ML Susp Commonly known as: GAVISCON Take 15 mLs by mouth as needed for indigestion or heartburn.   amLODipine 5 MG tablet Commonly known as: NORVASC Take 5 mg twice a day   apixaban 2.5 MG Tabs tablet Commonly known as: ELIQUIS Take 1 tablet (2.5 mg total) by mouth 2 (two) times daily.   atorvastatin 80 MG tablet Commonly known as: LIPITOR TAKE 1 TABLET BY MOUTH EVERYDAY AT BEDTIME   azelastine 0.1 % nasal spray Commonly known as: ASTELIN PLACE 2 SPRAYS INTO BOTH NOSTRILS AT BEDTIME AS NEEDED FOR RHINITIS OR ALLERGIES.   dapagliflozin propanediol 10 MG Tabs tablet Commonly known as: FARXIGA Take 10 mg by mouth daily.   esomeprazole 40 MG capsule Commonly  known as: NexIUM Take 1 capsule (40 mg total) by mouth daily. What changed: how much to take   eye wash Soln Place 1 drop into both eyes at bedtime. [facility] Soothing Eye Wash   ezetimibe 10 MG tablet Commonly known as: ZETIA TAKE 1 TABLET BY MOUTH EVERY DAY   fenofibrate 160 MG tablet TAKE 1 TABLET BY MOUTH EVERY DAY   folic acid 1 MG tablet Commonly known as: FOLVITE TAKE 2 TABLETS BY MOUTH EVERY DAY   freestyle lancets USE ONCE A DAY TO CHECK BLOOD SUGAR.   FREESTYLE LITE test strip Generic drug: glucose blood USE TO TEST BLOOD SUGAR ONCE A DAY. DX CODE: E11.9   glimepiride 1 MG tablet Commonly  known as: AMARYL TAKE 1 TABLET BY MOUTH EVERY DAY WITH BREAKFAST   icosapent Ethyl 1 g capsule Commonly known as: VASCEPA TAKE 2 CAPSULES BY MOUTH TWICE A DAY   ICY HOT ADVANCED PAIN RELIEF EX Apply 1 application topically daily. Roll-on   Klor-Con M20 20 MEQ tablet Generic drug: potassium chloride SA TAKE 2 TABLETS (40 MEQ TOTAL) BY MOUTH 2 (TWO) TIMES DAILY.   metFORMIN 1000 MG tablet Commonly known as: GLUCOPHAGE TAKE 1 & 1/2 TABLETS BY MOUTH EVERY MORNING AND 1 TABLET IN THE EVENING.   metoprolol tartrate 25 MG tablet Commonly known as: LOPRESSOR TAKE 1/2 TABLET BY MOUTH TWICE DAILY   nitroGLYCERIN 0.4 MG SL tablet Commonly known as: NITROSTAT PLACE 1 TABLET (0.4 MG TOTAL) UNDER THE TONGUE EVERY 5 (FIVE) MINUTES AS NEEDED FOR CHEST PAIN.   pregabalin 150 MG capsule Commonly known as: LYRICA Take 1 capsule (150 mg total) by mouth 2 (two) times daily.   psyllium 58.6 % powder Commonly known as: METAMUCIL Take 1 packet by mouth daily as needed (constipation).   torsemide 20 MG tablet Commonly known as: DEMADEX Take 20 mg by mouth daily.        Allergies:  Allergies  Allergen Reactions   Ace Inhibitors Swelling   Benazepril Swelling    angioedema; he is not a candidate for any angiotensin receptor blockers because of this significant allergic reaction. Because of a history of documented adverse serious drug reaction;Medi Alert bracelet  is recommended   Entresto [Sacubitril-Valsartan]    Hctz [Hydrochlorothiazide] Anaphylaxis and Swelling    Tongue and lip swelling    Benazepril Hcl Other (See Comments)   Aspirin Other (See Comments)    Gastritis, cant take 325 Mg aspirin    Lactose Intolerance (Gi) Nausea And Vomiting    Past Medical History, Surgical history, Social history, and Family History were reviewed and updated.  Review of Systems: All other 10 point review of systems is negative.   Physical Exam:  vitals were not taken for this visit.   Wt  Readings from Last 3 Encounters:  12/26/21 257 lb 6.4 oz (116.8 kg)  12/24/21 256 lb 9.6 oz (116.4 kg)  12/18/21 253 lb (114.8 kg)    Ocular: Sclerae unicteric, pupils equal, round and reactive to light Ear-nose-throat: Oropharynx clear, dentition fair Lymphatic: No cervical or supraclavicular adenopathy Lungs no rales or rhonchi, good excursion bilaterally Heart regular rate and rhythm, no murmur appreciated Abd soft, nontender, positive bowel sounds MSK no focal spinal tenderness, no joint edema Neuro: non-focal, well-oriented, appropriate affect Breasts: Deferred   Lab Results  Component Value Date   WBC 6.6 11/27/2021   HGB 12.2 (L) 11/27/2021   HCT 38.4 (L) 11/27/2021   MCV 91.2 11/27/2021   PLT 151 11/27/2021  Lab Results  Component Value Date   FERRITIN 973 (H) 11/27/2021   IRON 52 11/27/2021   TIBC 371 11/27/2021   UIBC 319 11/27/2021   IRONPCTSAT 14 (L) 11/27/2021   Lab Results  Component Value Date   RETICCTPCT 2.0 12/28/2021   RBC 4.11 (L) 12/28/2021   RETICCTABS 52.1 11/22/2011   No results found for: "KPAFRELGTCHN", "LAMBDASER", "[facility]" Lab Results  Component Value Date   IGA 159 03/09/2012   Lab Results  Component Value Date   ALBUMINELP 4.2 07/27/2018   MSPIKE Not Observed 07/27/2018     Chemistry      Component Value Date/Time   NA 141 11/29/2021 1401   NA 144 06/27/2017 0857   K 5.1 11/29/2021 1401   K 3.9 06/27/2017 0857   CL 102 11/29/2021 1401   CL 103 06/27/2017 0857   CO2 21 11/29/2021 1401   CO2 26 06/27/2017 0857   BUN 34 (H) 11/29/2021 1401   BUN 12 06/27/2017 0857   CREATININE 1.69 (H) 11/29/2021 1401   CREATININE 1.63 (H) 08/28/2021 1255   CREATININE 1.65 (H) 04/18/2020 0956      Component Value Date/Time   CALCIUM 10.2 11/29/2021 1401   CALCIUM 9.2 06/27/2017 0857   ALKPHOS 56 08/28/2021 1255   ALKPHOS 128 (H) 06/27/2017 0857   AST 14 (L) 08/28/2021 1255   ALT 10 08/28/2021 1255   ALT 24 06/27/2017 0857    BILITOT 0.6 08/28/2021 1255       Impression and Plan: Richard [name] is a very pleasant 81 yo caucasian gentleman with diffuse large B-cell lymphoma (not "double hit" lymphoma). He completed treatment in February 2019.  B 12 injection given.  Iron studies pending.  Follow-up in 1 month. Xgeva due again at that time.   Lottie Dawson, NP 6/30/20231:54 PM

## 2021-12-28 NOTE — Telephone Encounter (Signed)
Per 12/28/21 los - gave patient upcoming appointments - confirmed

## 2021-12-31 LAB — IRON AND IRON BINDING CAPACITY (CC-WL,HP ONLY)
Iron: 62 ug/dL (ref 45–182)
Saturation Ratios: 18 % (ref 17.9–39.5)
TIBC: 353 ug/dL (ref 250–450)
UIBC: 291 ug/dL (ref 117–376)

## 2022-01-02 ENCOUNTER — Telehealth: Payer: Self-pay

## 2022-01-02 NOTE — Telephone Encounter (Signed)
-----   Message from Brendolyn Patty, MD sent at 01/02/2022 10:43 AM EDT ----- 1. Skin , right lateral cheek WELL DIFFERENTIATED SQUAMOUS CELL CARCINOMA WITH SUPERFICIAL INFILTRATION 2. Skin , right postauricular neck WELL DIFFERENTIATED SQUAMOUS CELL CARCINOMA WITH SUPERFICIAL INFILTRATION, CLOSE TO MARGIN 3. Skin , right forearm superior WELL DIFFERENTIATED SQUAMOUS CELL CARCINOMA, BASE INVOLVED 4. Skin , right wrist WELL DIFFERENTIATED SQUAMOUS CELL CARCINOMA WITH SUPERFICIAL INFILTRATION, CLOSE TO MARGIN   1-4 SCC skin cancer- already treated with EDC at time of biopsy   - please call patient

## 2022-01-02 NOTE — Telephone Encounter (Signed)
Left message for patient to call for biopsy results. 

## 2022-01-02 NOTE — Telephone Encounter (Signed)
Patient advised of BX results.  °

## 2022-01-07 DIAGNOSIS — I442 Atrioventricular block, complete: Secondary | ICD-10-CM | POA: Diagnosis not present

## 2022-01-07 DIAGNOSIS — Z950 Presence of cardiac pacemaker: Secondary | ICD-10-CM | POA: Diagnosis not present

## 2022-01-07 DIAGNOSIS — I483 Typical atrial flutter: Secondary | ICD-10-CM | POA: Diagnosis not present

## 2022-01-07 DIAGNOSIS — I5033 Acute on chronic diastolic (congestive) heart failure: Secondary | ICD-10-CM | POA: Diagnosis not present

## 2022-01-07 DIAGNOSIS — I480 Paroxysmal atrial fibrillation: Secondary | ICD-10-CM | POA: Diagnosis not present

## 2022-01-07 DIAGNOSIS — Z45018 Encounter for adjustment and management of other part of cardiac pacemaker: Secondary | ICD-10-CM | POA: Diagnosis not present

## 2022-01-08 ENCOUNTER — Encounter: Payer: Self-pay | Admitting: Pulmonary Disease

## 2022-01-08 ENCOUNTER — Ambulatory Visit (INDEPENDENT_AMBULATORY_CARE_PROVIDER_SITE_OTHER): Payer: Medicare Other | Admitting: Pulmonary Disease

## 2022-01-08 ENCOUNTER — Ambulatory Visit (INDEPENDENT_AMBULATORY_CARE_PROVIDER_SITE_OTHER): Payer: Medicare Other | Admitting: Pharmacist

## 2022-01-08 ENCOUNTER — Telehealth: Payer: Self-pay | Admitting: Pharmacist

## 2022-01-08 DIAGNOSIS — I25810 Atherosclerosis of coronary artery bypass graft(s) without angina pectoris: Secondary | ICD-10-CM | POA: Diagnosis not present

## 2022-01-08 DIAGNOSIS — R042 Hemoptysis: Secondary | ICD-10-CM | POA: Diagnosis not present

## 2022-01-08 DIAGNOSIS — G4733 Obstructive sleep apnea (adult) (pediatric): Secondary | ICD-10-CM | POA: Diagnosis not present

## 2022-01-08 DIAGNOSIS — I25118 Atherosclerotic heart disease of native coronary artery with other forms of angina pectoris: Secondary | ICD-10-CM

## 2022-01-08 DIAGNOSIS — E1169 Type 2 diabetes mellitus with other specified complication: Secondary | ICD-10-CM

## 2022-01-08 DIAGNOSIS — I10 Essential (primary) hypertension: Secondary | ICD-10-CM

## 2022-01-08 DIAGNOSIS — N183 Chronic kidney disease, stage 3 unspecified: Secondary | ICD-10-CM

## 2022-01-08 DIAGNOSIS — I5033 Acute on chronic diastolic (congestive) heart failure: Secondary | ICD-10-CM

## 2022-01-08 MED ORDER — METFORMIN HCL 500 MG PO TABS: 500.0000 mg | ORAL_TABLET | Freq: Two times a day (BID) | ORAL | 1 refills | Status: DC

## 2022-01-08 NOTE — Assessment & Plan Note (Signed)
Seems like he is not having a good diuresis on torsemide 20 mg.  His creatinine is 2.2 so we will need close monitoring with increased diuresis  Increase toresemide to 40 mg ( 2 tabs ) daily   X check BMET on Thursday -2 days from now  X Weigh yourself daily. If weight not down by 5 lbs, we will add zaroxlyn '5mg'$  - take 1 h before toresemide   Obtain earlier appointment with cardiology, we will try to see him again next week

## 2022-01-08 NOTE — Patient Instructions (Signed)
Mr. Buresh It was a pleasure speaking with you  Below is a summary of your health goals and care plan  Patient Goals/Self-Care Activities Over the next 90 days, patient will:  take medications as prescribed check glucose daily, document, and provide at future appointments check blood pressure 2 to 3 times daily, document, and provide at future appointments target a minimum of 150 minutes of moderate intensity exercise weekly (once shortness of breath improves) Decrease metformin  '500mg'$  twice a day with food.  Engage in dietary modifications by continuing to cut back on sugar containing beverages and foods Notify office of signs of CHF exacerbation - weight gain, SOB, abdominal fullness, swelling in legs or abdomen, Fatigue and weakness, changes in ability to perform usual activities, persistent cough or wheezing with white or pink blood-tinged mucus, nausea and lack of appetite Continue to weigh daily - report weight gain of more than 3 lbs in 24 hours or 5 lbs in 1 week.  If you have any questions or concerns, please feel free to contact me either at the phone number below or with a MyChart message.   Keep up the good work!  [name], PharmD Clinical Pharmacist [facility] [place] [facility] [phone] (direct line)  [phone] (main office number)   Patient verbalizes understanding of instructions and care plan provided today and agrees to view in [place]. Active MyChart status and patient understanding of how to access instructions and care plan via MyChart confirmed with patient.

## 2022-01-08 NOTE — Telephone Encounter (Signed)
Recent changes in renal function. Serum creatinine was 2.17 (12/28/2021)  with eGFR of 40 mL / min and estimated CrCl of 34 mL / min.  Called to see if Dr Amil Amen recommends dose adjustment in allopurinol based on most recent renal function. Patient is currently taking allopurinol 465m daily.  Left message with Dr BMelissa Noonassistant on VM. Awaiting return call.

## 2022-01-08 NOTE — Assessment & Plan Note (Signed)
CPAP download was reviewed which shows excellent control of events and 15 cm with large leak that has been present for a long time.  He is very compliant and CPAP is certainly helped improve his daytime somnolence and fatigue.  CPAP supplies will be renewed for a year

## 2022-01-08 NOTE — Progress Notes (Signed)
   Subjective:    Patient ID: Richard [name], male    DOB: Dec 26, 1940, 81 y.o.   MRN: [id number]  HPI   81 yo man For FU of obstructive sleep apnea.   He got a new CPAP machine in 10/2015   PMH -  diffuse large B-cell lymphoma s/p chemotherapy (ennever) , in remission severe AS s/p TAVR  & PPM CHB  -left sided subclavian PPM placed 08/2017 Atrial Fibrillation/Flutter (eliquis, metoprolol), 08/2021 extraction of left axillary approach transvenous pacing system and Micra AV leadless pacemaker    He has a Port-A-Cath on the other side  70-monthfollow-up visit Accompanied by his sister. He reports increased shortness of breath , he has gained 10 pounds over the past few weeks. Chest x-ray 12/26/2021 shows cardiomegaly with increased interstitial edema.  BUN/creatinine 41/2.2 11/2021 Reviewed cardiology note from 11/2021  Underwent Lexiscan 12/18/2021 with no evidence of ischemia or infarction. Notable reduced EF. echocardiogram 12/18/2021 with akinetic anterior/anteroseptal wall from base to apex with low EF at 30-35% with moderately decreased LV function. Right ventricular systolic function mildly reduced.  He developed angioedema with EDelene LollHe remains on Eliquis and torsemide 20 mg.  He also reports blood-tinged sputum for the past few weeks  Reports COVID infection 08/2021 and strep throat 10/2021  Significant tests/ events reviewed  2008 PSG RDI 48/h ONO on CPAP /RA 08/2014 >> no desatn - dc O2    Review of Systems neg for any significant sore throat, dysphagia, itching, sneezing, nasal congestion or excess/ purulent secretions, fever, chills, sweats, unintended wt loss, pleuritic or exertional cp, hempoptysis, orthopnea pnd or change in chronic leg swelling. Also denies presyncope, palpitations, heartburn, abdominal pain, nausea, vomiting, diarrhea or change in bowel or urinary habits, dysuria,hematuria, rash, arthralgias, visual complaints, headache, numbness weakness or ataxia.      Objective:   Physical Exam  Gen. Pleasant, obese, in no distress ENT - no lesions, no post nasal drip Neck: No JVD, no thyromegaly, no carotid bruits Lungs: no use of accessory muscles, no dullness to percussion, decreased without rales or rhonchi  Cardiovascular: Rhythm regular, heart sounds  normal, no murmurs or gallops, 2+  peripheral edema Musculoskeletal: No deformities, no cyanosis or clubbing , no tremors , left arm sleeve.  Skin -bruising       Assessment & Plan:

## 2022-01-08 NOTE — Patient Instructions (Signed)
  Increase toresemide to 40 mg ( 2 tabs ) daily   X check BMET on Thursday   X Weigh yourself daily. If weight not down by 5 lbs, we will add zaroxlyn '5mg'$  - take 1 h before toresemide

## 2022-01-08 NOTE — Telephone Encounter (Signed)
Received call back from Dr Valora Piccolo assistant Benjamine Mola. She consulted with PAC in their office Leaf[facility] Kindle who recommended continuing current dose of allopurinol '450mg'$  daily as long as patient if tolerating well.

## 2022-01-08 NOTE — Chronic Care Management (AMB) (Signed)
Chronic Care Management Pharmacy Note  01/08/2022 Name:  Richard [name] MRN:  [id number] DOB:  1940-09-08  Summary: Diabetes - Blood glucose well controlled but triglycerides elevated. Last A1c was 6.3%. Due to changes in renal function - lowered dose of metformin to 589m twice with food (Scr = 2.17; eGFR = 40 and est CrCl = 34). Also spoke with EBenjamine [name] Dr BMelissa Noonoffice to let them know about changes in renal function since patient is taking allopurinol 4557mdaily. Recommended no change in dose as long as patient is tolerating.  CHF - reminded patient to check weight daily. Discussed signs and symptoms of CHF exacerbation - weight gain, SOB, abdominal fullness, swelling in legs or abdomen, Fatigue and weakness, changes in ability to perform usual activities, persistent cough or wheezing with white or pink blood-tinged mucus, nausea and lack of appetite. Report weight gain of more than 3 lbs in 24 hours or 5 lbs in 1 week.      Subjective: Richard [name] an [age].o. year old male who is a primary patient of Richard [name].  The CCM team was consulted for assistance with disease management and care coordination needs.    Engaged with patient by telephone for follow up visit in response to provider referral for pharmacy case management and/or care coordination services.   Consent to Services:  The patient was given information about Chronic Care Management services, agreed to services, and gave verbal consent prior to initiation of services.  Please see initial visit note for detailed documentation.   Patient Care Team: Richard HerterYvAlferd [name] as PCP - General (Family Medicine) HoMinus [name] as PCP - Cardiology (Cardiology) KlDeboraha SprangMD as PCP - Electrophysiology (Cardiology) Richard [name] (Ophthalmology) AlRigoberto [name] as Consulting Physician (Pulmonary Disease) EnVolanda [name] as Consulting Physician (Oncology) HoMinus [name] as  Consulting Physician (Cardiology) Richard [name] as Nurse Practitioner (Pulmonary Disease) Richard [name] (Dermatology) GiLatanya [name] as Consulting Physician (Orthopedic Surgery) Kidney, CaInez PilgrimRPBeauregardPharmacist) SiMurlean [name] (Nephrology) Richard [name] as Referring Physician (Radiology)  Recent office visits: 11/16/2021 - Richard [name] (Dr LoJason NestSeen for strep throat. Prescribed amoxicillin 87519mwice a day 09/14/2021 - Richard [name] (Dr LowEtter SjogrenCheri [name] Visit - COVID. Prescribed molnupiravir and prednisone. 07/26/2021 - Richard [name] (Dr Richard [name]/U chronic conditions. Labs checked. TG noted to be elevated. No medication changes noted.    Recent consult visits: 01/08/2022 - Pulm (Dr Richard Sohoncreased torsemide to 8m42mily. Recheck BMP in 2 days. Considering addition of metolazone if weight does not decrease by at least 5 lbs over the next 2 to 3 days.  01/07/2022 - [name] F/U 91 day postop revision of pacemaker w/medtronic Richard [name] AV leadless pacemaker 08/2021. Scheduled remote device checks. No [name] changes noted. F/U 3 to 4 months.   11/29/2021- [facility] (Richard [name] Chest x-ray form 11/28/2021 showed pulomonary edema. Suspect DOE is likely multifactorial - low iron deconditioning, worsening valvular disease +/- CAD. Recommended torsemide 8mg58mly for 3 days , then resume usual. Potassium 80mEq59mam and 40 in pm for 3 days, then usual dose. 11/23/2021 - Cardio (Dr HocherJenkins [name] for follow up TAVR / CAD and atrial flutter. No [name] changes noted. F/U 1 year. 10/16/2021 - [facility] ([name] with balloon angioplasty scheduled.  09/25/2021 - Hem/Onc (Richard PostF/U  lymphona and anemia. Received B12 injection and Ferahema. Iron studies checked. F/U 1 month 09/18/2021 - Radiology ([name]) F/U [name] balloon dilatation of the left subclavian vein following removal  of pacemaker leads. F/U 1 month. 07/27/2021 - Hematology Richard Post, NP) F/U anemia due to B12 deficiency and large B cell lymphoma. B12 injection given. Labs checked.  07/10/2021 - Radiology (Dr Richard [name]) F/U left upper extremity swelling. Plan for port removal and bilateral upper extremity venogram under moderate sedation.Will hold Eliquis for 48 hours prior to the procedure 06/27/2021 - Hematology Richard Post, NP) F/U anemia due to B12 deficiency and large B cell lymphoma. GIven B12 and xgeva. Labs checked. F/U 1 month. 06/26/2021 - Radiology (Dr Richard [name] - [facility]) Seen for left arm swelling following left sided pacemaker placement. Recommended he continue Eliquis and wear compression sleeve. Will discuss case w/Dr. Caryl [name] and Richard [name] for Pacemaker revision and right subclavian port removed prior to Pacemaker placement.    Hospital visits: 08/20/2021 - removal of permanent pacemaker and leads from left side. Inplantation leadless pacemaker (Richard [name]) 08/13/2021 - Radiology (Dr Richard [name]) Port removal   Objective:  Lab Results  Component Value Date   CREATININE 2.17 (H) 12/28/2021   CREATININE 1.69 (H) 11/29/2021   CREATININE 1.63 (H) 08/28/2021    Lab Results  Component Value Date   HGBA1C 6.3 07/26/2021   Last diabetic Eye exam:  Lab Results  Component Value Date/Time   HMDIABEYEEXA No Retinopathy 04/05/2021 12:00 AM    Last diabetic Foot exam: No results found for: "HMDIABFOOTEX"      Component Value Date/Time   CHOL 168 07/26/2021 0912   CHOL 162 11/23/2020 0828   TRIG (H) 07/26/2021 0912    690.0 Triglyceride is over 400; calculations on Lipids are invalid.   HDL 32.40 (L) 07/26/2021 0912   HDL 38 (L) 11/23/2020 0828   CHOLHDL 5 07/26/2021 0912   VLDL 44.0 (H) 11/24/2018 0815   LDLCALC 89 11/23/2020 0828   LDLCALC  04/18/2020 0956     Comment:     . LDL cholesterol not calculated. Triglyceride levels greater than 400 mg/dL invalidate calculated LDL results. . Reference  range: <100 . Desirable range <100 mg/dL for primary prevention;   <70 mg/dL for patients with CHD or diabetic patients  with > or = 2 CHD risk factors. [name] LDL-C is now calculated using the Martin-Hopkins  calculation, which is a validated novel method providing  better accuracy than the Friedewald equation in the  estimation of LDL-C.  [name] et al. [name]. 9629;528(41): 2061-2068  (http://education.[employer].com/faq/FAQ164)    LDLDIRECT 58.0 07/26/2021 0912       Latest Ref Rng & Units 12/28/2021    1:38 PM 08/28/2021   12:55 PM 07/27/2021    1:03 PM  Hepatic Function  Total Protein 6.5 - 8.1 g/dL 6.7  6.9  7.0   Albumin 3.5 - 5.0 g/dL 4.6  4.3  4.6   AST 15 - 41 U/L 12  14  15    ALT 0 - 44 U/L 12  10  12    Alk Phosphatase 38 - 126 U/L 59  56  68   Total Bilirubin 0.3 - 1.2 mg/dL 0.8  0.6  0.5     Lab Results  Component Value Date/Time   TSH 2.450 07/27/2018 03:24 PM   TSH 2.101 08/18/2017 06:08 AM   TSH 2.53 07/28/2015 10:04 AM   FREET4 0.87 06/21/2014 04:45 PM       Latest Ref Rng &  Units 12/28/2021    1:38 PM 11/27/2021    2:23 PM 10/26/2021    1:04 PM  CBC  WBC 4.0 - 10.5 K/uL 8.2  6.6  7.8   Hemoglobin 13.0 - 17.0 g/dL 11.8  12.2  12.6   Hematocrit 39.0 - 52.0 % 38.1  38.4  40.3   Platelets 150 - 400 K/uL 119  151  169     Lab Results  Component Value Date/Time   VD25OH 16.1 (L) 07/27/2018 03:24 PM    Clinical ASCVD: Yes  The ASCVD Risk score (Arnett DK, et al., 2019) failed to calculate for the following reasons:   The 2019 ASCVD risk score is only valid for ages 56 to 37   The patient has a prior MI or stroke diagnosis     Social History   Tobacco Use  Smoking Status Never  Smokeless Tobacco Never   BP Readings from Last 3 Encounters:  01/08/22 108/64  12/28/21 106/66  12/26/21 112/68   Pulse Readings from Last 3 Encounters:  01/08/22 81  12/28/21 75  12/26/21 71   Wt Readings from Last 3 Encounters:  01/08/22 266 lb 3.2 oz  (120.7 kg)  12/28/21 258 lb (117 kg)  12/26/21 257 lb 6.4 oz (116.8 kg)    Assessment: Review of patient past medical history, allergies, medications, health status, including review of consultants reports, laboratory and other test data, was performed as part of comprehensive evaluation and provision of chronic care management services.   SDOH:  (Social Determinants of Health) assessments and interventions performed:    CCM Care Plan  Allergies  Allergen Reactions   Ace Inhibitors Swelling   Benazepril Swelling    angioedema; he is not a candidate for any angiotensin receptor blockers because of this significant allergic reaction. Because of a history of documented adverse serious drug reaction;Medi Alert bracelet  is recommended   Entresto [Sacubitril-Valsartan]    Hctz [Hydrochlorothiazide] Anaphylaxis and Swelling    Tongue and lip swelling    Benazepril Hcl Other (See Comments)   Aspirin Other (See Comments)    Gastritis, cant take 325 Mg aspirin    Lactose Intolerance (Gi) Nausea And Vomiting    Medications Reviewed Today     Reviewed by [name], RPH-CPP (Pharmacist) on 01/08/22 at 1439  [name] List Status: <None>   Medication Order Taking? Sig Documenting Provider Last Dose Status Informant  acetaminophen (TYLENOL) 325 MG tablet [id number] Yes Take 650 mg by mouth at bedtime as needed for moderate pain or headache. [provider] Taking Active Self  allopurinol (ZYLOPRIM) 300 MG tablet [id number] Yes Take 450 mg by mouth daily. [provider] Taking Active Self  aluminum hydroxide-magnesium carbonate (GAVISCON) 95-358 MG/15ML SUSP [id number] Yes Take 15 mLs by mouth as needed for indigestion or heartburn. [provider] Taking Active Self  amLODipine (NORVASC) 5 MG tablet [id number] Yes Take 5 mg twice a day Richard Breeding, MD Taking Active   apixaban (ELIQUIS) 2.5 MG TABS tablet [id number] Yes Take 1 tablet (2.5 mg total) by mouth 2 (two) times  daily. Richard Breeding, MD Taking Active   atorvastatin (LIPITOR) 80 MG tablet [id number] Yes TAKE 1 TABLET BY MOUTH EVERYDAY AT BEDTIME Richard Breeding, MD Taking Active   azelastine (ASTELIN) 0.1 % nasal spray [id number] Yes PLACE 2 SPRAYS INTO BOTH NOSTRILS AT BEDTIME AS NEEDED FOR RHINITIS OR ALLERGIES. Roma Schanz R, DO Taking Active   dapagliflozin propanediol (FARXIGA) 10 MG TABS tablet [id number] Yes Take 10 mg  by mouth daily. [provider] Taking Active Self  esomeprazole (NEXIUM) 40 MG capsule [id number] Yes Take 1 capsule (40 mg total) by mouth daily.  Patient taking differently: Take 20 mg by mouth daily.   [name], MD Taking Active Self  eye wash (,SODIUM/POTASSIUM/SOD CHLORIDE,) Bailey Mech [id number] Yes Place 1 drop into both eyes at bedtime. Shafter [provider] Taking Active Self  ezetimibe (ZETIA) 10 MG tablet [id number] Yes TAKE 1 TABLET BY MOUTH EVERY Kelton Pillar, MD Taking Active   fenofibrate 160 MG tablet [id number] Yes TAKE 1 TABLET BY MOUTH EVERY DAY Carollee [name], Richard Apa, DO Taking Active   folic acid (FOLVITE) 1 MG tablet [id number] Yes TAKE 2 TABLETS BY MOUTH EVERY DAY Richard Napoleon, MD Taking Active   FREESTYLE LITE test strip [id number] Yes USE TO TEST BLOOD SUGAR ONCE A DAY. DX CODE: E11.9 [name], DO Taking Active   glimepiride (AMARYL) 1 MG tablet [id number] Yes TAKE 1 TABLET BY MOUTH EVERY DAY WITH BREAKFAST Carollee [name], [place] R, DO Taking Active   icosapent Ethyl (VASCEPA) 1 g capsule [id number] Yes TAKE 2 CAPSULES BY MOUTH TWICE A DAY Lowne Koren Shiver, DO Taking Active   KLOR-CON M20 20 MEQ tablet [id number] Yes TAKE 2 TABLETS (40 MEQ TOTAL) BY MOUTH 2 (TWO) TIMES DAILY. Richard Breeding, MD Taking Active   Lancets (FREESTYLE) lancets [id number] Yes USE ONCE A DAY TO CHECK BLOOD SUGAR. [name], DO Taking Active   Menthol-Camphor (ICY HOT ADVANCED PAIN RELIEF EX) [id number] Yes Apply  1 application topically daily. Roll-on [provider] Taking Active Self  metFORMIN (GLUCOPHAGE) 1000 MG tablet [id number] Yes TAKE 1 & 1/2 TABLETS BY MOUTH EVERY MORNING AND 1 TABLET IN THE EVENING. Roma Schanz R, DO Taking Active   metoprolol tartrate (LOPRESSOR) 25 MG tablet [id number] Yes TAKE 1/2 TABLET BY MOUTH TWICE DAILY Richard Breeding, MD Taking Active   nitroGLYCERIN (NITROSTAT) 0.4 MG SL tablet [id number] Yes PLACE 1 TABLET (0.4 MG TOTAL) UNDER THE TONGUE EVERY 5 (FIVE) MINUTES AS NEEDED FOR CHEST PAIN. Richard Breeding, MD Taking Active   pregabalin (LYRICA) 150 MG capsule [id number] Yes Take 1 capsule (150 mg total) by mouth 2 (two) times daily. Roma Schanz R, DO Taking Active   psyllium (METAMUCIL) 58.6 % powder [id number] Yes Take 1 packet by mouth daily as needed (constipation). [provider] Taking Active Self  torsemide (DEMADEX) 20 MG tablet [id number] Yes Take 40 mg by mouth daily. [provider] Taking Active Self  [name] List Note Henderson Newcomer, CPhT 06/10/13 5035): cpap machine 2 L of oxygen at night            Patient Active Problem List   Diagnosis Date Noted   Hemoptysis 12/26/2021   Subclavian vein stenosis 12/23/2021   Strep throat 11/16/2021   Multiple superficial wounds with infection 01/04/2021   Cardiac pacemaker in situ 11/20/2020   S/P CABG x 4 11/20/2020   Ascending aortic aneurysm ([place]) 11/20/2020   Senile purpura ([place]) 07/27/2020   Porokeratosis 06/20/2020   Pain due to onychomycosis of toenails of both feet 12/14/2019   Educated about COVID-19 virus infection 11/25/2019   Uncontrolled type 2 diabetes mellitus with hypoglycemia without coma ([place]) 08/10/2019   Hyperlipidemia associated with type 2 diabetes mellitus ([place]) 08/10/2019   S/P left TKA 06/29/2019   Peripheral neuropathy 07/27/2018   Uncontrolled type 2 diabetes mellitus with hyperglycemia ([place]) 05/13/2018  Diabetic peripheral neuropathy  associated with type 2 diabetes mellitus ([place]) 05/13/2018   Pansinusitis 05/13/2018   Severe aortic stenosis    Complete heart block (HCC)    Paroxysmal atrial fibrillation (HCC)    Acute on chronic diastolic CHF (congestive heart failure), NYHA class 2 (HCC)    Left arm swelling    Status [name] transcatheter aortic valve replacement (TAVR) using bioprosthesis 09/09/2017   Demand ischemia of myocardium (HCC)    Typical atrial flutter (HCC)    Acute on chronic diastolic heart failure ([place])    GIB (gastrointestinal bleeding) 08/16/2017   Hyperlipidemia 08/05/2017   NSTEMI (non-ST elevated myocardial infarction) ([place])    Aortic stenosis, severe 07/25/2017   Pancytopenia ([place]) 07/24/2017   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 ([place]) 02/27/2017   Bradycardia 01/04/2017   Coronary artery disease 01/04/2017   Stenosis of carotid artery 01/04/2017   Obesity 09/18/2016   Wenckebach block    Disorder associated with type II diabetes mellitus ([place]) 05/21/2013   Spinal stenosis of lumbar region 04/08/2013   Anemia, iron deficiency 11/29/2011   B12 deficiency anemia 07/30/2011   OSA (obstructive sleep apnea) 07/06/2010   CAD, ARTERY BYPASS GRAFT 08/22/2009   Essential hypertension 03/29/2009   Bilateral lower extremity edema 02/21/2009   Hyperlipidemia LDL goal <70 11/26/2008    Immunization History  Administered Date(s) Administered   Fluad Quad(high Dose 65+) 03/21/2021   Influenza Split 06/12/2011, 05/31/2013   Influenza Whole 05/01/2012   Influenza, High Dose Seasonal PF 02/09/2015, 03/12/2017, 03/09/2018, 03/02/2020   Influenza-Unspecified 02/11/2014, 02/29/2016, 02/25/2019   Moderna Sars-Covid-2 Vaccination 08/30/2019, 09/30/2019, 04/21/2020   Pneumococcal Conjugate-13 09/18/2015   Pneumococcal Polysaccharide-23 06/20/2013   Td 03/20/2010, 03/16/2013, 09/29/2017   Zoster Recombinat (Shingrix) 11/21/2016, 09/25/2017    Conditions to be  addressed/monitored: Atrial Fibrillation, CHF, CAD, HTN, HLD, Hypertriglyceridemia, DMII, CKD Stage 3, and gout; neuropathy; h/o GIB; OSA with CPAP;   Care Plan : General Pharmacy (Adult)  Updates made by [name], RPH-CPP since 01/08/2022 12:00 AM     Problem: HTN, HLD, DM, HF   Priority: High  Onset Date: 09/13/2020     Long-Range Goal: Provide education, support and care coordination for medication therapy and chronic conditions   Start Date: 09/13/2020  Expected End Date: 03/16/2021  Recent Progress: On track  Priority: High  Note:   Current Barriers:  Unable to independently afford cost of medication (Farxiga, Eliquis)  Unable to maintain control of mixed hyperlipidemia (elevated LDL and triglycerides and low HDL) Changes in renal function  Pharmacist Clinical Goal(s):  Over the next 90 days, patient will achieve adherence to monitoring guidelines and medication adherence to achieve therapeutic efficacy maintain control of blood pressure and BG as evidenced by home monitoring.  contact provider office for questions/concerns as evidenced notation of same in electronic health record through collaboration with PharmD and provider.  Work to bring down TG's by dietary modifications, continued exercised, control of BG and taking medications as prescribed Review medications and make adjustments if needed based on renal function  Interventions: 1:1 collaboration with Carollee [name], Richard Apa, DO regarding development and update of comprehensive plan of care as evidenced by provider attestation and co-signature Inter-disciplinary care team collaboration (see longitudinal plan of care) Comprehensive medication review performed; medication list updated in electronic medical record  Hypertension (BP goal <130/80) Controlled Current treatment: Metoprolol Tartrate 80m - take 0.5 tablet (= 12.513m twice daily  Medications previously tried: benazepril (swelling), HCTZ (anaphylaxis)   Current home BP  range SBP = 120 to 130 DBP = 65 to 70 Diet:  has been reducing intake of sweets and fried foods Also is chooseing leaner proteins and lower fat dairy. Has replaced sodas with sparkling water. Exercise: decreased recently due to shortness of breath. Has not golfed in several weeks. Usually goes 3 times per week . Denies hypotensive/hypertensive symptoms Interventions:  Educated on blood pressure  goals and benefits of medications for prevention of heart attack, stroke and kidney damage; Recommended limit daily salt intake to < 2300 mg; Exercise goal of 150 minutes per week; Continue to check blood pressure  at home 2 to 3 times per week; document, and provide log at future appointments Recommended to continue current medication  Hyperlipidemia / CAD: (LDL goal < 70; Tg goal <150; HDL goal >40) Not at goal but triglycerides have improved from 839 to 206! LDL is close to goal Current treatment: Atorvastatin 70m daily Ezetimibe 123mdaily Fenofibrate 16033maily Vascepa 1g - take 2 capsules twice daily  Nitroglycerin 0.4mg11m needed Medications previously tried: none noted Interventions:  Continue dietary changes mentioned above under HTN Reviewed cholesterol goals;  Continue to exercise regularly with goal of at least 150 minutes per week Future treatment options if LDL and Tg remain above goals - change atorvastatin to rosuvastatin 40mg61madd PCSK9 agent  Diabetes (A1c goal <7%) Controlled; Last A1c was 6.3%  Current medications: Glimepiride 1mg d20my  Metformin 1000mg 174mabs each morning, and 1 tablet each evening Farxiga 10mg da53mCurrent home glucose readings range from 90 to 150s Denies hypoglycemic/hyperglycemic symptoms Screened for patient assistance program for Farxiga at past visit - patient did not meet 2022 patient assistance program income requirement. Current meal patterns: see above Current exercise: see above Recent decrease in renal  function.  Interventions:  Educated on A1c and blood glucose goals Continue to exercise at least 150 minutes per week; Reminded to check feet daily and get yearly eye exam Recommended to continue current medication Adjusted metformin dose to 500mg twi91m day with food due to decreased in eGFR  Heart Failure (Goal: manage symptoms and prevent exacerbations) Uncontrolled  Experiencing edema / swelling or SOB Followed by cardiology Dr. Hochrein Percival Spanishction fraction: 30-35%  HF type: Diastolic / HFrEF Current treatment: Torsemide 40mg dail74mncreased 01/08/2022 by Dr Alva)  Pot[place] Sohoum 20mEq - ta73m tablets = 40 mEq twice daily Metoprolol tartrate 25mg - take10m tablet = 12.5mg twice a 59m Farxiga 10mg daily Me40mtions previously tried: furosemide (ineffective); ACE - angioedema;  Entresto - not started due to history of engioedema with ACE-inhibitor Interventions:  Educated on Benefits of medications for managing symptoms and prolonging life Recommended to continue current medication Discussed signs and symptoms of CHF exacerbation - weight gain, SOB, abdominal fullness, swelling in legs or abdomen, Fatigue and weakness, changes in ability to perform usual activities, persistent cough or wheezing with white or pink blood-tinged mucus, nausea and lack of appetite Continue to weigh daily - report weight gain of more than 3 lbs in 24 hours or 5 lbs in 1 week.  CKD - stage 3 (goal: slow pregression of  renal disease and adjust medications as needed based on renal function) Managed by nephrology Current treatment: Farxiga 10mg daily Las61mFR was 39.15 Estimated CrCL = 25 - 35mL/min (range32med on using adjusted and ideal body weight)  Lab Results  Component Value Date   CREATININE 2.17 (H) 12/28/2021   Wt Readings from Last 3 Encounters:  01/08/22  266 lb 3.2 oz (120.7 kg)  12/28/21 258 lb (117 kg)  12/26/21 257 lb 6.4 oz (116.8 kg)   Interventions:  Reviewed medications that  might need adjustment based on renal function:  Eliquis - adjusted already based on renal function and age to 2.51m twice a day Pregabalin - CrCL 30 to 60 mL/min = max of 303m day (patient is taking 30095m day) Fenofibrate - recommended starting dose is 63m45mily and adjust upward as needed monitoring for myalgia. Patient is on highest dose but Ok as long as monitoring for myalgias due to increased risk with lower renal function and concurrent treatment with statin.  Metformin - if eGFR falls below 45 - monitor and weight risk versus benefits of full dose. Decreased metformin to 500mg79mce a day.   Left message with Dr BeekmAmil Amenrding allopurinol dose - currently taking 450mg 67my   Medication management Pharmacist Clinical Goal(s): Over the next 90 days, patient will work with PharmD and providers to maintain optimal medication adherence Current [facility] [place] Interventions Comprehensive medication review performed. Reviewed refill history and assessed adherence Continue current medication management strategy Patient self care activities - Over the next 90 days, patient will: Focus on medication adherence by filling medications appropriately  Take medications as prescribed Report any questions or concerns to PharmD and/or provider(s)  Patient Goals/Self-Care Activities Over the next 90 days, patient will:  take medications as prescribed check glucose daily, document, and provide at future appointments check blood pressure 2 to 3 times daily, document, and provide at future appointments target a minimum of 150 minutes of moderate intensity exercise weekly (once shortness of breath improves) Decrease metformin  500mg t42m a day with food.  Engage in dietary modifications by continuing to cut back on sugar containing beverages and foods  Follow Up Plan: The care management team will reach out to the patient again over the next 90 days.        Medication Assistance:   assessed for Farxiga and Eliquis PAP -patient does not meet income requirement  [facility]'s [facility]:  [facility] [id number] [phone] Up:  Patient agrees to Care Plan and Follow-up.  Plan: Telephone follow up appointment with care management team member scheduled for:  1 to 2 months  Keya Wynes ECherre RobinsD Clinical Pharmacist [phone]

## 2022-01-08 NOTE — Assessment & Plan Note (Signed)
Does not appear to be significant but concerning that he is on Eliquis with creatinine 2.2 range. I have asked him to stop Eliquis if hemoptysis worsens.  He clearly needs this for atrial fibrillation

## 2022-01-10 ENCOUNTER — Other Ambulatory Visit (INDEPENDENT_AMBULATORY_CARE_PROVIDER_SITE_OTHER): Payer: Medicare Other

## 2022-01-10 ENCOUNTER — Telehealth: Payer: Self-pay | Admitting: Pulmonary Disease

## 2022-01-10 ENCOUNTER — Other Ambulatory Visit: Payer: Self-pay | Admitting: Pulmonary Disease

## 2022-01-10 DIAGNOSIS — I5033 Acute on chronic diastolic (congestive) heart failure: Secondary | ICD-10-CM | POA: Diagnosis not present

## 2022-01-10 LAB — BASIC METABOLIC PANEL
BUN: 39 mg/dL — ABNORMAL HIGH (ref 6–23)
CO2: 25 mEq/L (ref 19–32)
Calcium: 9.8 mg/dL (ref 8.4–10.5)
Chloride: 104 mEq/L (ref 96–112)
Creatinine, Ser: 1.91 mg/dL — ABNORMAL HIGH (ref 0.40–1.50)
GFR: 32.03 mL/min — ABNORMAL LOW (ref >60.00)
Glucose, Bld: 163 mg/dL — ABNORMAL HIGH (ref 70–99)
Potassium: 4.4 mEq/L (ref 3.5–5.1)
Sodium: 138 mEq/L (ref 135–145)

## 2022-01-10 NOTE — Telephone Encounter (Signed)
Called and spoke with pt who states that he has lost 5 pounds and wants to know if he still needs to take an extra torsemide as recommended in OV with Dr. Elsworth Soho 7/11 or if he should be okay to continue to take the '20mg'$  torsemide as he had been doing. Pt said that he has an appt 7/18 with [facility] for a follow up. Dr. Elsworth Soho, please advise on this.

## 2022-01-11 NOTE — Telephone Encounter (Signed)
I do not see lasix on med list Dr. Elsworth Soho  said in note to continue on Torsemide '40mg'$  daily

## 2022-01-11 NOTE — Telephone Encounter (Signed)
Called and spoke with patient to let him know to continue 40 mg until his appt next week. Patient expressed understanding. Nothing further needed at this time.

## 2022-01-11 NOTE — Telephone Encounter (Signed)
[place], [place]  01/10/2022  5:27 PM EDT Back to Top    Spoke with patient regarding lab results. They verbalized understanding. Patient would like to know if he needs to continue  the 40 mg lasix and states that he has lost 5 pounds and is feeling better but feels like he still has some swelling in his legs. Please advise   Rigoberto Noel, MD  01/10/2022  1:17 PM EDT     Kidney function and potassium is okay. Has he lost 5 pounds or has his swelling decreased?. If not then add Zaroxolyn as we discussed during office visit    Tammy- pt called back bc still no response on what to do about his torsemide and the wkend is here. Alva off today. Can you please advise? Thanks!

## 2022-01-15 ENCOUNTER — Encounter: Payer: Self-pay | Admitting: Nurse Practitioner

## 2022-01-15 ENCOUNTER — Ambulatory Visit (INDEPENDENT_AMBULATORY_CARE_PROVIDER_SITE_OTHER): Payer: Medicare Other

## 2022-01-15 ENCOUNTER — Ambulatory Visit (INDEPENDENT_AMBULATORY_CARE_PROVIDER_SITE_OTHER): Payer: Medicare Other | Admitting: Nurse Practitioner

## 2022-01-15 VITALS — BP 116/68 | HR 78 | Temp 97.8°F | Ht 72.0 in | Wt 261.0 lb

## 2022-01-15 DIAGNOSIS — I5033 Acute on chronic diastolic (congestive) heart failure: Secondary | ICD-10-CM | POA: Diagnosis not present

## 2022-01-15 DIAGNOSIS — R042 Hemoptysis: Secondary | ICD-10-CM

## 2022-01-15 DIAGNOSIS — G4733 Obstructive sleep apnea (adult) (pediatric): Secondary | ICD-10-CM | POA: Diagnosis not present

## 2022-01-15 DIAGNOSIS — J811 Chronic pulmonary edema: Secondary | ICD-10-CM | POA: Diagnosis not present

## 2022-01-15 DIAGNOSIS — J90 Pleural effusion, not elsewhere classified: Secondary | ICD-10-CM | POA: Diagnosis not present

## 2022-01-15 LAB — BASIC METABOLIC PANEL
BUN: 48 mg/dL — ABNORMAL HIGH (ref 6–23)
CO2: 25 mEq/L (ref 19–32)
Calcium: 10 mg/dL (ref 8.4–10.5)
Chloride: 105 mEq/L (ref 96–112)
Creatinine, Ser: 2.03 mg/dL — ABNORMAL HIGH (ref 0.40–1.50)
GFR: 29.76 mL/min — ABNORMAL LOW (ref >60.00)
Glucose, Bld: 151 mg/dL — ABNORMAL HIGH (ref 70–99)
Potassium: 4.3 mEq/L (ref 3.5–5.1)
Sodium: 141 mEq/L (ref 135–145)

## 2022-01-15 LAB — BRAIN NATRIURETIC PEPTIDE: Pro B Natriuretic peptide (BNP): 1211 pg/mL — ABNORMAL HIGH (ref 0.0–100.0)

## 2022-01-15 NOTE — Progress Notes (Signed)
Please notify patient his CXR today showed persistent fluid overload/pulmonary edema. He should continue his torsemide as prescribed and follow up with cardiology on Monday, as we scheduled today. Please advise him to go to the ED should his breathing or swelling get worse before he sees them. Monitor oxygen at home for goal >88-90%. Thanks.

## 2022-01-15 NOTE — Assessment & Plan Note (Signed)
Excellent control and compliance. Continues to receive good benefit. No changes made today.

## 2022-01-15 NOTE — Progress Notes (Signed)
_0  ID: Richard [name], male    DOB: [birth year], 81 y.o.   MRN: [id number]  Chief Complaint  Patient presents with   Follow-up    Patient states his lungs has fluid on his lungs.     Referring provider: [name], *  HPI: 81 year old male, never smoker followed for OSA on CPAP.  He is a patient of Dr. Bari Mantis and last seen in office 01/08/2022.  Past medical history significant for CHF, hypertension, CAD status post CABG, CAD, history of NSTEMI, AS status post TAVR and PPM, PAF on Eliquis, AAA without rupture, DM 2, HLD, neuropathy, anemia, obesity.  He has a history of diffuse large B-cell lymphoma status postchemotherapy and followed by Dr. Marin Olp.  TEST/EVENTS:  2008 PSG: RDI 48/h 08/2014 Ono on CPAP/room air: No desaturation-DC'd oxygen  01/08/2022: OV with Dr. Elsworth Soho.  Increase shortness of breath and 10 pound weight gain over the last few weeks CXR from 12/26/2021 shows cardiomegaly with increased interstitial edema.  Blood-tinged sputum for the past few months.  He had COVID infection in March 2023 and strep throat in May 2023.  CPAP download with excellent control of events and compliance.  CPAP supplies renewed for a year.  Discussed that hemoptysis did not seem to be significant but concerning that he is on Eliquis.  He was asked to stop Eliquis if hemoptysis worsens.  Not currently having good diuresis on torsemide 20 mg.  His creatinine is 2.2 so we will need monitoring.  Increase torsemide to 40 mg daily; plan to recheck be met in 2 days.  Advised to weigh himself and if he is not down by 5 pounds at next visit, plan to add metolazone 5 mg 1 hour before torsemide.  Recommended to obtain earlier appointment with cardiology.  01/15/2022: Today-follow-up Patient presents today for follow-up after being started on higher dose of torsemide for volume overload and associated dyspnea.  He has lost about 5 pounds; however, his breathing feels relatively unchanged.  He continues  to have lower extremity edema, which she feels may be worse than it was previously.  He has been taking torsemide 40 mg daily as previously directed.  Does feel like he was voiding more frequently, however, seems to have returned to more of his baseline.  He still continues to have hemoptysis first thing in the morning.  Reports that as the size of a pencil eraser and does not occur at any other point during the day.  He is described as pink to bright red.  It is no worse and better.  This has been ongoing for the past few months.  Otherwise, cough is congested but usually non-productive. He is also having difficulties with orthopnea. He is unable to lay flat without his CPAP machine. He denies any chest pain, fevers, night sweats, palpitations.   Allergies  Allergen Reactions   Ace Inhibitors Swelling   Benazepril Swelling    angioedema; he is not a candidate for any angiotensin receptor blockers because of this significant allergic reaction. Because of a history of documented adverse serious drug reaction;Medi Alert bracelet  is recommended   Entresto [Sacubitril-Valsartan]    Hctz [Hydrochlorothiazide] Anaphylaxis and Swelling    Tongue and lip swelling    Benazepril Hcl Other (See Comments)   Aspirin Other (See Comments)    Gastritis, cant take 325 Mg aspirin    Lactose Intolerance (Gi) Nausea And Vomiting    Immunization History  Administered Date(s) Administered  Fluad Quad(high Dose 65+) 03/21/2021   Influenza Split 06/12/2011, 05/31/2013   Influenza Whole 05/01/2012   Influenza, High Dose Seasonal PF 02/09/2015, 03/12/2017, 03/09/2018, 03/02/2020   Influenza-Unspecified 02/11/2014, 02/29/2016, 02/25/2019   Moderna Sars-Covid-2 Vaccination 08/30/2019, 09/30/2019, 04/21/2020   Pneumococcal Conjugate-13 09/18/2015   Pneumococcal Polysaccharide-23 06/20/2013   Td 03/20/2010, 03/16/2013, 09/29/2017   Zoster Recombinat (Shingrix) 11/21/2016, 09/25/2017    Past Medical History:   Diagnosis Date   Anemia    Arthritis    hips   Axillary adenopathy 02/25/2017   Bradycardia    a. holter monitor has demonstrated HRs in 30s and Weink[place]ach    CAD (coronary artery disease)    a. s/p CABG 2001  b.  07/28/2017 cath:   Severe three-vessel native CAD with total occlusion of LAD, ramus intermedius, first OM and RCA, patent RIMA to PDA, LIMA to LAD, sequential SVG to ramus intermedius and first OM.     Chronic lower back pain    Diffuse large B cell lymphoma (HCC)    Diverticulosis    Esophageal stricture    GERD (gastroesophageal reflux disease)    History of gout    HTN (hypertension)    Mixed hyperlipidemia    OSA on CPAP    with 2L O2 at night   Pancytopenia ([place])    a. related to chemo therapy for B cell lymphoma   Peptic stricture of esophagus    Presence of permanent cardiac pacemaker    sees Dr. Valaria Good pacemaker   SCC (squamous cell carcinoma) 01/31/2021   R zygoma, [facility]   SCC (squamous cell carcinoma) 01/31/2021   L post ankle, [facility]   SCC (squamous cell carcinoma) 01/31/2021   L popliteal, [facility]   Severe aortic stenosis    Spinal stenosis    Squamous cell carcinoma of skin 03/22/2009   Right mandible. SCCis, hypertrophic.    Squamous cell carcinoma of skin 12/25/2021   R lateral cheek, [facility]   Squamous cell carcinoma of skin 12/25/2021   R postauricular neck, [facility]   Squamous cell carcinoma of skin 12/25/2021   R forearm sup, [facility]   Squamous cell carcinoma of skin 12/25/2021   R wrist, [facility]   Type II diabetes mellitus ([place])    Wears dentures    partial upper    Tobacco History: Social History   Tobacco Use  Smoking Status Never  Smokeless Tobacco Never   Counseling given: Not Answered   Outpatient Medications Prior to Visit  Medication Sig Dispense Refill   acetaminophen (TYLENOL) 325 MG tablet Take 650 mg by mouth at bedtime as needed for moderate pain or headache.     allopurinol (ZYLOPRIM) 300 MG tablet Take 450 mg by mouth daily.      aluminum hydroxide-magnesium carbonate (GAVISCON) 95-358 MG/15ML SUSP Take 15 mLs by mouth as needed for indigestion or heartburn.     amLODipine (NORVASC) 5 MG tablet Take 5 mg twice a day 180 tablet 3   apixaban (ELIQUIS) 2.5 MG TABS tablet Take 1 tablet (2.5 mg total) by mouth 2 (two) times daily. 180 tablet 0   atorvastatin (LIPITOR) 80 MG tablet TAKE 1 TABLET BY MOUTH EVERYDAY AT BEDTIME 90 tablet 3   azelastine (ASTELIN) 0.1 % nasal spray PLACE 2 SPRAYS INTO BOTH NOSTRILS AT BEDTIME AS NEEDED FOR RHINITIS OR ALLERGIES. 30 mL 4   dapagliflozin propanediol (FARXIGA) 10 MG TABS tablet Take 10 mg by mouth daily.     esomeprazole (NEXIUM) 40 MG capsule Take 1 capsule (40  mg total) by mouth daily. (Patient taking differently: Take 20 mg by mouth daily.) 30 capsule 5   eye wash (,SODIUM/POTASSIUM/SOD CHLORIDE,) SOLN Place 1 drop into both eyes at bedtime. [facility] Soothing Eye Wash     ezetimibe (ZETIA) 10 MG tablet TAKE 1 TABLET BY MOUTH EVERY DAY 90 tablet 3   fenofibrate 160 MG tablet TAKE 1 TABLET BY MOUTH EVERY DAY 90 tablet 0   folic acid (FOLVITE) 1 MG tablet TAKE 2 TABLETS BY MOUTH EVERY DAY 180 tablet 4   FREESTYLE LITE test strip USE TO TEST BLOOD SUGAR ONCE A DAY. DX CODE: E11.9 100 strip 12   glimepiride (AMARYL) 1 MG tablet TAKE 1 TABLET BY MOUTH EVERY DAY WITH BREAKFAST 90 tablet 1   icosapent Ethyl (VASCEPA) 1 g capsule TAKE 2 CAPSULES BY MOUTH TWICE A DAY 360 capsule 1   KLOR-CON M20 20 MEQ tablet TAKE 2 TABLETS (40 MEQ TOTAL) BY MOUTH 2 (TWO) TIMES DAILY. 360 tablet 3   Lancets (FREESTYLE) lancets USE ONCE A DAY TO CHECK BLOOD SUGAR. 100 each 12   Menthol-Camphor (ICY HOT ADVANCED PAIN RELIEF EX) Apply 1 application topically daily. Roll-on     metFORMIN (GLUCOPHAGE) 500 MG tablet Take 1 tablet (500 mg total) by mouth 2 (two) times daily with a meal. 180 tablet 1   metoprolol tartrate (LOPRESSOR) 25 MG tablet TAKE 1/2 TABLET BY MOUTH TWICE DAILY 90 tablet 3   nitroGLYCERIN  (NITROSTAT) 0.4 MG SL tablet PLACE 1 TABLET (0.4 MG TOTAL) UNDER THE TONGUE EVERY 5 (FIVE) MINUTES AS NEEDED FOR CHEST PAIN. 25 tablet 6   pregabalin (LYRICA) 150 MG capsule Take 1 capsule (150 mg total) by mouth 2 (two) times daily. 180 capsule 1   psyllium (METAMUCIL) 58.6 % powder Take 1 packet by mouth daily as needed (constipation).     torsemide (DEMADEX) 20 MG tablet Take 40 mg by mouth daily.     No facility-administered medications prior to visit.     Review of Systems:   Constitutional: No weight loss or gain, night sweats, fevers, chills, fatigue, or lassitude. HEENT: No headaches, difficulty swallowing, tooth/dental problems, or sore throat. No sneezing, itching, ear ache, nasal congestion, or post nasal drip CV:  +orthopnea, leg swelling. No chest pain,  PND, anasarca, dizziness, palpitations, syncope Resp: +shortness of breath with exertion; AM hemoptysis; congested cough; occasional wheeze. No excess mucus or change in color of mucus.  No chest wall deformity GI:  No heartburn, indigestion, abdominal pain, nausea, vomiting, diarrhea, change in bowel habits, loss of appetite, bloody stools.  GU: No dysuria, change in color of urine, urgency or frequency.  No flank pain, no hematuria  Skin: No rash, lesions, ulcerations Neuro: No dizziness or lightheadedness.  Psych: No depression or anxiety. Mood stable.     Physical Exam:  BP 116/68 (BP Location: Right Wrist, Patient Position: Sitting, Cuff Size: Normal)   Pulse 78   Temp 97.8 F (36.6 C) (Oral)   Ht 6' (1.829 m)   Wt 261 lb (118.4 kg)   SpO2 94%   BMI 35.40 kg/m   GEN: Pleasant, interactive, well-kempt; obese; in no acute distress. HEENT:  Normocephalic and atraumatic. PERRLA. Sclera white. Nasal turbinates pink, moist and patent bilaterally. No rhinorrhea present. Oropharynx pink and moist, without exudate or edema. No lesions, ulcerations, or postnasal drip.  NECK:  Supple w/ fair ROM. No JVD present. Normal  carotid impulses w/o bruits. Thyroid symmetrical with no goiter or nodules palpated. No lymphadenopathy.  CV: Irregular rhythm, rate controlled, systolic murmur, +0-9 pitting edema. Pulses intact, +2 bilaterally. No cyanosis, pallor or clubbing. PULMONARY:  Unlabored, regular breathing. End expiratory wheeze bilaterally posteriorly. No accessory muscle use. No dullness to percussion. GI: BS present and normoactive. Soft, non-tender to palpation.  MSK: No erythema, warmth or tenderness. Cap refil <2 sec all extrem. No deformities or joint swelling noted.  Neuro: A/Ox3. No focal deficits noted.   Skin: Warm, no lesions or rashe Psych: Normal affect and behavior. Judgement and thought content appropriate.     Lab Results:  CBC    Component Value Date/Time   WBC 8.2 12/28/2021 1338   WBC 7.6 06/22/2019 1055   RBC 4.11 (L) 12/28/2021 1339   RBC 4.15 (L) 12/28/2021 1338   HGB 11.8 (L) 12/28/2021 1338   HGB 11.5 (L) 09/18/2017 1559   HGB 10.8 (L) 06/27/2017 0857   HCT 38.1 (L) 12/28/2021 1338   HCT 36.7 (L) 09/18/2017 1559   HCT 34.0 (L) 06/27/2017 0857   PLT 119 (L) 12/28/2021 1338   PLT 69 (LL) 09/18/2017 1559   MCV 91.8 12/28/2021 1338   MCV 90 09/18/2017 1559   MCV 90 06/27/2017 0857   MCH 28.4 12/28/2021 1338   MCHC 31.0 12/28/2021 1338   RDW 18.4 (H) 12/28/2021 1338   RDW 18.4 (H) 09/18/2017 1559   RDW 17.9 (H) 06/27/2017 0857   LYMPHSABS 1.2 12/28/2021 1338   LYMPHSABS 0.5 (L) 06/27/2017 0857   MONOABS 0.7 12/28/2021 1338   EOSABS 0.1 12/28/2021 1338   EOSABS 0.0 06/27/2017 0857   BASOSABS 0.1 12/28/2021 1338   BASOSABS 0.1 06/27/2017 0857    BMET    Component Value Date/Time   NA 141 01/15/2022 1122   NA 141 11/29/2021 1401   NA 144 06/27/2017 0857   K 4.3 01/15/2022 1122   K 3.9 06/27/2017 0857   CL 105 01/15/2022 1122   CL 103 06/27/2017 0857   CO2 25 01/15/2022 1122   CO2 26 06/27/2017 0857   GLUCOSE 151 (H) 01/15/2022 1122   GLUCOSE 166 (H) 06/27/2017  0857   BUN 48 (H) 01/15/2022 1122   BUN 34 (H) 11/29/2021 1401   BUN 12 06/27/2017 0857   CREATININE 2.03 (H) 01/15/2022 1122   CREATININE 2.17 (H) 12/28/2021 1338   CREATININE 1.65 (H) 04/18/2020 0956   CALCIUM 10.0 01/15/2022 1122   CALCIUM 9.2 06/27/2017 0857   GFRNONAA 29 (L) 12/28/2021 1338   GFRAA 42 (L) 03/16/2020 0814    BNP    Component Value Date/Time   BNP 1,104.8 (H) 09/05/2017 1400     Imaging:  DG Chest 2 View  Result Date: 01/15/2022 CLINICAL DATA:  Pulmonary edema. EXAM: CHEST - 2 VIEW COMPARISON:  Chest radiographs 12/26/2021 FINDINGS: Sequelae of transcatheter aortic valve replacement are again identified. The cardiac silhouette remains mildly enlarged. Aortic atherosclerosis and a leadless pacemaker are again noted. Pulmonary vascular congestion and mild diffuse interstitial prominence are similar to the prior study, and there are persistent trace bilateral pleural effusions. No pneumothorax is identified. No acute osseous abnormality is seen. IMPRESSION: Unchanged findings of mild interstitial edema and trace pleural effusions. Electronically Signed   By: Logan Bores M.D.   On: 01/15/2022 11:33   DG Chest 2 View  Result Date: 12/26/2021 CLINICAL DATA:  Hemoptysis EXAM: CHEST - 2 VIEW COMPARISON:  Previous studies including the examination of 11/28/2018 FINDINGS: Transverse diameter of heart is increased. There is previous cardiac surgery. Central pulmonary vessels are prominent. There  are no signs of alveolar pulmonary edema. There is slight prominence of interstitial markings in the lower lung fields with no significant interval change. There is blunting of lateral CP angles. There is no pneumothorax. IMPRESSION: Cardiomegaly. Central pulmonary vessels are prominent suggesting mild CHF. Prominence of interstitial markings in the lower lung fields may suggest mild interstitial edema or interstitial pneumonia. Minimal pleural effusions. Electronically Signed   By:  Elmer Picker M.D.   On: 12/26/2021 12:47   MYOCARDIAL PERFUSION IMAGING  Result Date: 12/18/2021   Findings are consistent with no prior ischemia and no prior myocardial infarction. The study is high risk based on reduction of her LV EF   No ST deviation was noted.   LV perfusion is normal. There is no evidence of ischemia. There is no evidence of infarction.   Left ventricular function is abnormal. Nuclear stress EF: 32 %. The left ventricular ejection fraction is moderately decreased (30-44%). End diastolic cavity size is severely enlarged.   Prior study not available for comparison.   ECHOCARDIOGRAM COMPLETE  Result Date: 12/18/2021    ECHOCARDIOGRAM REPORT   Patient Name:   Richard [name] Date of Exam: 12/18/2021 Medical Rec #:  [id number]      Height:       72.0 in Accession #:    [id number]     Weight:       253.0 lb Date of Birth:  1940/09/21      BSA:          2.354 m Patient Age:    89 years       BP:           107/58 mmHg Patient Gender: M              HR:           78 bpm. Exam Location:  [place] Procedure: 2D Echo, Cardiac Doppler, Color Doppler, Intracardiac Opacification            Agent and Strain Analysis Indications:    S/P TAVR (transcatheter aortic valve replacement) [Z95.2                 (ICD-10-CM)]; Coronary artery disease involving coronary bypass                 graft of native heart without angina pectoris [I25.810                 (ICD-10-CM)]; Medication management [phone] (ICD-10-CM)];                 Atrial flutter, unspecified type ([place]) [I48.92 (ICD-10-CM)]; DOE                 (dyspnea on exertion) [R06.09 (ICD-10-CM)]  History:        Patient has prior history of Echocardiogram examinations, most                 recent 12/23/2019. CHF, CAD, Prior CABG, Aortic Valve Disease,                 Arrythmias:Atrial Fibrillation and Wenckebach block; Risk                 Factors:Dyslipidemia, Hypertension and Diabetes.  Sonographer:    Philipp Deputy RDCS Referring Phys:  Leanor Kail  Sonographer Comments: Global longitudinal strain was attempted. IMPRESSIONS  1. Akinetic anterior/ anteroseptal wall from base to apex. Left ventricular ejection fraction, by estimation, is 30 to 35%. The left ventricle has  moderately decreased function. The left ventricular internal cavity size was mildly dilated. Left ventricular diastolic parameters are indeterminate.  2. Right ventricular systolic function is mildly reduced. The right ventricular size is moderately enlarged. There is mildly elevated pulmonary artery systolic pressure.  3. Left atrial size was mildly dilated.  4. Right atrial size was mildly dilated.  5. Restricted posterior leaflet . The mitral valve is degenerative. Mild to moderate mitral valve regurgitation. Moderate mitral annular calcification.  6. No significant paravalvular leak. Prosthesis is well seated. Peak velocity 1.98 m/s. DI 0.32. Normal functioning prosthesis. The aortic valve has been repaired/replaced. Aortic valve regurgitation is not visualized. Procedure Date: 09/09/2017.  7. Aortic dilatation noted and mild dilation of the ascending aorta and root.  8. The inferior vena cava is dilated in size with >50% respiratory variability, suggesting right atrial pressure of 8 mmHg. Conclusion(s)/Recommendation(s): Compared to prior echo 12/23/2019, EF has declined with LAD territory WMA. FINDINGS  Left Ventricle: Akinetic anterior/ anteroseptal wall from base to apex. Left ventricular ejection fraction, by estimation, is 30 to 35%. The left ventricle has moderately decreased function. Definity contrast agent was given IV to delineate the left ventricular endocardial borders. The left ventricular internal cavity size was mildly dilated. There is no left ventricular hypertrophy. Abnormal (paradoxical) septal motion consistent with post-operative status. Left ventricular diastolic parameters are  indeterminate. Right Ventricle: The right ventricular size is moderately  enlarged. Right ventricular systolic function is mildly reduced. There is mildly elevated pulmonary artery systolic pressure. The tricuspid regurgitant velocity is 2.68 m/s, and with an assumed right atrial pressure of 8 mmHg, the estimated right ventricular systolic pressure is 38.4 mmHg. Left Atrium: Left atrial size was mildly dilated. Right Atrium: Right atrial size was mildly dilated. Pericardium: There is no evidence of pericardial effusion. Mitral Valve: Restricted posterior leaflet. The mitral valve is degenerative in appearance. Moderate mitral annular calcification. Mild to moderate mitral valve regurgitation. MV peak gradient, 10.2 mmHg. The mean mitral valve gradient is 4.0 mmHg. Tricuspid Valve: Tricuspid valve regurgitation is mild. Aortic Valve: No significant paravalvular leak. Prosthesis is well seated. Peak velocity 1.98 m/s. DI 0.32. Normal functioning prosthesis. The aortic valve has been repaired/replaced. Aortic valve regurgitation is not visualized. Aortic valve mean gradient measures 9.0 mmHg. Aortic valve peak gradient measures 15.7 mmHg. Aortic valve area, by VTI measures 0.81 cm. There is a 26 mm Sapien prosthetic, stented (TAVR) valve present in the aortic position. Pulmonic Valve: The pulmonic valve was not well visualized. Pulmonic valve regurgitation is not visualized. Aorta: Aortic dilatation noted and mild dilation of the ascending aorta and root. Venous: The inferior vena cava is dilated in size with greater than 50% respiratory variability, suggesting right atrial pressure of 8 mmHg. Additional Comments: A device lead is visualized.  LEFT VENTRICLE PLAX 2D LVIDd:         5.90 cm   Diastology LVIDs:         4.60 cm   LV e' medial:    80.54 cm/s LV PW:         1.00 cm   LV E/e' medial:  2.0 LV IVS:        0.80 cm   LV e' lateral:   11.30 cm/s LVOT diam:     1.80 cm   LV E/e' lateral: 14.2 LV SV:         31 LV SV Index:   13 LVOT Area:     2.54 cm  RIGHT VENTRICLE  IVC RV  Basal diam:  5.10 cm    IVC diam: 3.30 cm RV Mid diam:    3.80 cm RV S prime:     8.92 cm/s TAPSE (M-mode): 1.6 cm LEFT ATRIUM             Index        RIGHT ATRIUM           Index LA diam:        4.20 cm 1.78 cm/m   RA Area:     34.70 cm LA Vol (A2C):   60.5 ml 25.70 ml/m  RA Volume:   122.00 ml 51.82 ml/m LA Vol (A4C):   75.4 ml 32.02 ml/m LA Biplane Vol: 67.7 ml 28.75 ml/m  AORTIC VALVE AV Area (Vmax):    1.00 cm AV Area (Vmean):   0.97 cm AV Area (VTI):     0.81 cm AV Vmax:           198.20 cm/s AV Vmean:          140.800 cm/s AV VTI:            0.381 m AV Peak Grad:      15.7 mmHg AV Mean Grad:      9.0 mmHg LVOT Vmax:         78.02 cm/s LVOT Vmean:        53.620 cm/s LVOT VTI:          0.121 m LVOT/AV VTI ratio: 0.32  AORTA Ao Root diam: 3.40 cm Ao Asc diam:  3.30 cm MITRAL VALVE                  TRICUSPID VALVE MV Area (PHT): 4.36 cm       TR Peak grad:   28.7 mmHg MV Area VTI:   0.91 cm       TR Vmax:        268.00 cm/s MV Peak grad:  10.2 mmHg MV Mean grad:  4.0 mmHg       SHUNTS MV Vmax:       1.60 m/s       Systemic VTI:  0.12 m MV Vmean:      94.6 cm/s      Systemic Diam: 1.80 cm MV Decel Time: 174 msec MR Peak grad:    82.1 mmHg MR Mean grad:    53.0 mmHg MR Vmax:         453.00 cm/s MR Vmean:        344.0 cm/s MR PISA:         1.57 cm MR PISA Eff ROA: 13 mm MR PISA Radius:  0.50 cm MV E velocity: 161.00 cm/s Phineas Inches Electronically signed by Phineas Inches Signature Date/Time: 12/18/2021/9:58:50 AM    Final     cyanocobalamin ((VITAMIN B-12)) injection 1,000 mcg     Date Action Dose Route User   11/27/2021 1456 Given 1,000 mcg Intramuscular (Left Deltoid) Tildon Husky P, RN      cyanocobalamin ((VITAMIN B-12)) injection 1,000 mcg     Date Action Dose Route User   12/28/2021 1422 Given 1,000 mcg Intramuscular (Right Deltoid) Johny Drilling, RN      ferumoxytol (FERAHEME) 510 mg in sodium chloride 0.9 % 100 mL IVPB     Date Action Dose Route User   12/07/2021 1355 New  Bag/Given 510 mg Intravenous Poindexter, Macario Carls, RN      ferumoxytol (FERAHEME) 510 mg in sodium chloride 0.9 %  100 mL IVPB     Date Action Dose Route User   12/21/2021 1413 Infusion Verify (none) Intravenous Johny Drilling, RN   12/21/2021 1356 Rate/Dose Change (none) Intravenous Johny Drilling, RN   12/21/2021 1356 Infusion Verify (none) Intravenous Johny Drilling, RN   12/21/2021 1356 New Bag/Given 510 mg Intravenous Johny Drilling, RN      perflutren lipid microspheres (DEFINITY) IV suspension     Date Action Dose Route User   12/18/2021 [phone] Given 6 mL Intravenous Thompson Grayer, RDCS      regadenoson [facility]) injection SOLN 0.4 mg     Date Action Dose Route User   12/18/2021 0841 Given 0.4 mg Intravenous Hornowski Kubak, Elzbieta      0.9 %  sodium chloride infusion     Date Action Dose Route User   12/07/2021 1423 Infusion Verify (none) Intravenous Shelda Altes, RN   12/07/2021 1420 Rate/Dose Change (none) Intravenous Shelda Altes, RN   12/07/2021 1420 Rate/Dose Change (none) Intravenous Shelda Altes, RN   12/07/2021 1419 Restarted (none) Intravenous Shelda Altes, RN   12/07/2021 1410 Restarted (none) Intravenous Shelda Altes, RN      0.9 %  sodium chloride infusion     Date Action Dose Route User   12/21/2021 1416 Restarted (none) Intravenous Johny Drilling, RN   12/21/2021 1348 New Bag/Given (none) Intravenous Johny Drilling, RN      technetium tetrofosmin (TC-MYOVIEW) injection 29.5 millicurie     Date Action Dose Route User   12/18/2021 0720 Contrast Given 62.1 millicurie Intravenous Hornowski Kubak, Elzbieta      technetium tetrofosmin (TC-MYOVIEW) injection 30.8 millicurie     Date Action Dose Route User   12/18/2021 0842 Contrast Given 65.7 millicurie Intravenous Hornowski Kubak, Elzbieta          Latest Ref Rng & Units 08/12/2017    9:13 AM  PFT Results  FVC-Pre L 3.75   FVC-Predicted Pre %  85   FVC-Post L 3.63   FVC-Predicted Post % 82   Pre FEV1/FVC % % 74   Post FEV1/FCV % % 81   FEV1-Pre L 2.79   FEV1-Predicted Pre % 88   FEV1-Post L 2.93   DLCO uncorrected ml/min/mmHg 17.11   DLCO UNC% % 48   DLCO corrected ml/min/mmHg 20.56   DLCO COR %Predicted % 58   DLVA Predicted % 68   TLC L 6.20   TLC % Predicted % 83   RV % Predicted % 87     No results found for: "NITRICOXIDE"      Assessment & Plan:   Acute on chronic diastolic CHF (congestive heart failure), NYHA class 2 (HCC) Persistent volume overload upon exam.  He has had minimal improvement in his dyspnea since being seen last week, despite increase in torsemide.  Unable to add metolazone due to his history of angioedema with HCTZ.  Walking oximetry today without desaturation and vital signs were stable.  We were able to get him set up for acute visit with cardiology on Monday.  He was provided with strict ED precautions in the interim.  We will check a chest x-ray today, BMET and BNP.  We did discuss that depending on his lab results and imaging, may recommend he go to the ED for further evaluation/IV diuretic therapy. Continue toresemide 40 mg daily for now until seen by cardiology.  Patient Instructions  Continue to use CPAP every night, minimum of 4-6 hours a  night.  Change equipment every 30 days or as directed by DME. Wash your tubing with warm soap and water daily, hang to dry. Wash humidifier portion weekly.  Be aware of reduced alertness and do not drive or operate heavy machinery if experiencing this or drowsiness.  Exercise encouraged, as tolerated. Avoid or decrease alcohol consumption and medications that make you more sleepy, if possible. Notify if persistent daytime sleepiness occurs even with consistent use of CPAP.  Continue torsemide 40 mg daily  We have contacted cardiology to get you set up for an appointment this week.   Labs today - BMET and BNP Chest x ray today.  We will plan for CT  chest for further evaluation; however, you will need to lay flat for this so we may have to wait until we have gotten some more fluid off  Follow up in one week with Dr. Elsworth Soho or Alanson Aly. If symptoms do not improve or worsen, please contact office for sooner follow up or seek emergency care.     Hemoptysis Hemoptysis over the past 2 months per his report, occurring in AM. Could be related to his pulmonary edema and CHF. It is no worse than last visit and previous CBC stable. If this does not improve with volume correction, we will order CT chest for further evaluation. At this point in time, I don't think he would be able to tolerate the scan due to his orthopnea. He will need to hold Eliquis if hemoptysis worsens.   OSA (obstructive sleep apnea) Excellent control and compliance. Continues to receive good benefit. No changes made today.   I spent 42 minutes of dedicated to the care of this patient on the date of this encounter to include pre-visit review of records, face-to-face time with the patient discussing conditions above, post visit ordering of testing, clinical documentation with the electronic health record, making appropriate referrals as documented, and communicating necessary findings to members of the patients care team.  Clayton Bibles, NP 01/15/2022  Pt aware and understands NP's role.

## 2022-01-15 NOTE — Assessment & Plan Note (Signed)
Persistent volume overload upon exam.  He has had minimal improvement in his dyspnea since being seen last week, despite increase in torsemide.  Unable to add metolazone due to his history of angioedema with HCTZ.  Walking oximetry today without desaturation and vital signs were stable.  We were able to get him set up for acute visit with cardiology on Monday.  He was provided with strict ED precautions in the interim.  We will check a chest x-ray today, BMET and BNP.  We did discuss that depending on his lab results and imaging, may recommend he go to the ED for further evaluation/IV diuretic therapy. Continue toresemide 40 mg daily for now until seen by cardiology.  Patient Instructions  Continue to use CPAP every night, minimum of 4-6 hours a night.  Change equipment every 30 days or as directed by DME. Wash your tubing with warm soap and water daily, hang to dry. Wash humidifier portion weekly.  Be aware of reduced alertness and do not drive or operate heavy machinery if experiencing this or drowsiness.  Exercise encouraged, as tolerated. Avoid or decrease alcohol consumption and medications that make you more sleepy, if possible. Notify if persistent daytime sleepiness occurs even with consistent use of CPAP.  Continue torsemide 40 mg daily  We have contacted cardiology to get you set up for an appointment this week.   Labs today - BMET and BNP Chest x ray today.  We will plan for CT chest for further evaluation; however, you will need to lay flat for this so we may have to wait until we have gotten some more fluid off  Follow up in one week with Dr. Elsworth Soho or Alanson Aly. If symptoms do not improve or worsen, please contact office for sooner follow up or seek emergency care.

## 2022-01-15 NOTE — Patient Instructions (Addendum)
Continue to use CPAP every night, minimum of 4-6 hours a night.  Change equipment every 30 days or as directed by DME. Wash your tubing with warm soap and water daily, hang to dry. Wash humidifier portion weekly.  Be aware of reduced alertness and do not drive or operate heavy machinery if experiencing this or drowsiness.  Exercise encouraged, as tolerated. Avoid or decrease alcohol consumption and medications that make you more sleepy, if possible. Notify if persistent daytime sleepiness occurs even with consistent use of CPAP.  Continue torsemide 40 mg daily  We have contacted cardiology to get you set up for an appointment this week.   Labs today - BMET and BNP Chest x ray today.  We will plan for CT chest for further evaluation; however, you will need to lay flat for this so we may have to wait until we have gotten some more fluid off  Follow up in one week with Dr. Elsworth Soho or Alanson Aly. If symptoms do not improve or worsen, please contact office for sooner follow up or seek emergency care.

## 2022-01-15 NOTE — Assessment & Plan Note (Signed)
Hemoptysis over the past 2 months per his report, occurring in AM. Could be related to his pulmonary edema and CHF. It is no worse than last visit and previous CBC stable. If this does not improve with volume correction, we will order CT chest for further evaluation. At this point in time, I don't think he would be able to tolerate the scan due to his orthopnea. He will need to hold Eliquis if hemoptysis worsens.

## 2022-01-16 ENCOUNTER — Encounter (HOSPITAL_COMMUNITY): Payer: Self-pay

## 2022-01-16 ENCOUNTER — Inpatient Hospital Stay (HOSPITAL_COMMUNITY): Payer: Medicare Other

## 2022-01-16 ENCOUNTER — Emergency Department (HOSPITAL_COMMUNITY): Payer: Medicare Other

## 2022-01-16 ENCOUNTER — Inpatient Hospital Stay (HOSPITAL_COMMUNITY)
Admission: EM | Admit: 2022-01-16 | Discharge: 2022-01-23 | DRG: 291 | Disposition: A | Discharge status: Home / Self Care | Payer: Medicare Other | Attending: Internal Medicine | Admitting: Internal Medicine

## 2022-01-16 DIAGNOSIS — Z85828 Personal history of other malignant neoplasm of skin: Secondary | ICD-10-CM

## 2022-01-16 DIAGNOSIS — Z950 Presence of cardiac pacemaker: Secondary | ICD-10-CM | POA: Diagnosis present

## 2022-01-16 DIAGNOSIS — I255 Ischemic cardiomyopathy: Secondary | ICD-10-CM | POA: Diagnosis present

## 2022-01-16 DIAGNOSIS — E1142 Type 2 diabetes mellitus with diabetic polyneuropathy: Secondary | ICD-10-CM | POA: Diagnosis present

## 2022-01-16 DIAGNOSIS — K5900 Constipation, unspecified: Secondary | ICD-10-CM | POA: Diagnosis not present

## 2022-01-16 DIAGNOSIS — I5033 Acute on chronic diastolic (congestive) heart failure: Secondary | ICD-10-CM | POA: Diagnosis not present

## 2022-01-16 DIAGNOSIS — I5043 Acute on chronic combined systolic (congestive) and diastolic (congestive) heart failure: Secondary | ICD-10-CM

## 2022-01-16 DIAGNOSIS — I4821 Permanent atrial fibrillation: Secondary | ICD-10-CM | POA: Diagnosis not present

## 2022-01-16 DIAGNOSIS — I350 Nonrheumatic aortic (valve) stenosis: Secondary | ICD-10-CM | POA: Diagnosis not present

## 2022-01-16 DIAGNOSIS — I2510 Atherosclerotic heart disease of native coronary artery without angina pectoris: Secondary | ICD-10-CM | POA: Diagnosis not present

## 2022-01-16 DIAGNOSIS — G4733 Obstructive sleep apnea (adult) (pediatric): Secondary | ICD-10-CM | POA: Diagnosis not present

## 2022-01-16 DIAGNOSIS — I5023 Acute on chronic systolic (congestive) heart failure: Principal | ICD-10-CM

## 2022-01-16 DIAGNOSIS — I340 Nonrheumatic mitral (valve) insufficiency: Secondary | ICD-10-CM | POA: Diagnosis present

## 2022-01-16 DIAGNOSIS — D500 Iron deficiency anemia secondary to blood loss (chronic): Secondary | ICD-10-CM | POA: Diagnosis not present

## 2022-01-16 DIAGNOSIS — I4892 Unspecified atrial flutter: Secondary | ICD-10-CM | POA: Diagnosis present

## 2022-01-16 DIAGNOSIS — I110 Hypertensive heart disease with heart failure: Secondary | ICD-10-CM | POA: Diagnosis not present

## 2022-01-16 DIAGNOSIS — D509 Iron deficiency anemia, unspecified: Secondary | ICD-10-CM | POA: Diagnosis present

## 2022-01-16 DIAGNOSIS — K219 Gastro-esophageal reflux disease without esophagitis: Secondary | ICD-10-CM | POA: Diagnosis not present

## 2022-01-16 DIAGNOSIS — G8929 Other chronic pain: Secondary | ICD-10-CM | POA: Diagnosis present

## 2022-01-16 DIAGNOSIS — Z8572 Personal history of non-Hodgkin lymphomas: Secondary | ICD-10-CM

## 2022-01-16 DIAGNOSIS — Z952 Presence of prosthetic heart valve: Secondary | ICD-10-CM

## 2022-01-16 DIAGNOSIS — I2582 Chronic total occlusion of coronary artery: Secondary | ICD-10-CM | POA: Diagnosis present

## 2022-01-16 DIAGNOSIS — I495 Sick sinus syndrome: Secondary | ICD-10-CM | POA: Diagnosis not present

## 2022-01-16 DIAGNOSIS — I252 Old myocardial infarction: Secondary | ICD-10-CM

## 2022-01-16 DIAGNOSIS — I509 Heart failure, unspecified: Secondary | ICD-10-CM

## 2022-01-16 DIAGNOSIS — D649 Anemia, unspecified: Secondary | ICD-10-CM | POA: Diagnosis not present

## 2022-01-16 DIAGNOSIS — Z951 Presence of aortocoronary bypass graft: Secondary | ICD-10-CM

## 2022-01-16 DIAGNOSIS — R042 Hemoptysis: Secondary | ICD-10-CM | POA: Diagnosis not present

## 2022-01-16 DIAGNOSIS — I130 Hypertensive heart and chronic kidney disease with heart failure and stage 1 through stage 4 chronic kidney disease, or unspecified chronic kidney disease: Secondary | ICD-10-CM | POA: Diagnosis present

## 2022-01-16 DIAGNOSIS — M545 Low back pain, unspecified: Secondary | ICD-10-CM | POA: Diagnosis present

## 2022-01-16 DIAGNOSIS — J811 Chronic pulmonary edema: Secondary | ICD-10-CM | POA: Diagnosis not present

## 2022-01-16 DIAGNOSIS — Z9221 Personal history of antineoplastic chemotherapy: Secondary | ICD-10-CM

## 2022-01-16 DIAGNOSIS — I10 Essential (primary) hypertension: Secondary | ICD-10-CM | POA: Diagnosis present

## 2022-01-16 DIAGNOSIS — Z86711 Personal history of pulmonary embolism: Secondary | ICD-10-CM | POA: Diagnosis not present

## 2022-01-16 DIAGNOSIS — I442 Atrioventricular block, complete: Secondary | ICD-10-CM | POA: Diagnosis present

## 2022-01-16 DIAGNOSIS — Z833 Family history of diabetes mellitus: Secondary | ICD-10-CM

## 2022-01-16 DIAGNOSIS — I714 Abdominal aortic aneurysm, without rupture, unspecified: Secondary | ICD-10-CM | POA: Diagnosis present

## 2022-01-16 DIAGNOSIS — M7989 Other specified soft tissue disorders: Secondary | ICD-10-CM

## 2022-01-16 DIAGNOSIS — Z888 Allergy status to other drugs, medicaments and biological substances status: Secondary | ICD-10-CM

## 2022-01-16 DIAGNOSIS — E1165 Type 2 diabetes mellitus with hyperglycemia: Secondary | ICD-10-CM | POA: Diagnosis present

## 2022-01-16 DIAGNOSIS — Z800 Family history of malignant neoplasm of digestive organs: Secondary | ICD-10-CM

## 2022-01-16 DIAGNOSIS — N179 Acute kidney failure, unspecified: Secondary | ICD-10-CM | POA: Diagnosis not present

## 2022-01-16 DIAGNOSIS — N1832 Chronic kidney disease, stage 3b: Secondary | ICD-10-CM | POA: Diagnosis not present

## 2022-01-16 DIAGNOSIS — M109 Gout, unspecified: Secondary | ICD-10-CM | POA: Diagnosis present

## 2022-01-16 DIAGNOSIS — R252 Cramp and spasm: Secondary | ICD-10-CM | POA: Diagnosis not present

## 2022-01-16 DIAGNOSIS — Z886 Allergy status to analgesic agent status: Secondary | ICD-10-CM

## 2022-01-16 DIAGNOSIS — E782 Mixed hyperlipidemia: Secondary | ICD-10-CM | POA: Diagnosis present

## 2022-01-16 DIAGNOSIS — Z96652 Presence of left artificial knee joint: Secondary | ICD-10-CM | POA: Diagnosis present

## 2022-01-16 DIAGNOSIS — Z79899 Other long term (current) drug therapy: Secondary | ICD-10-CM

## 2022-01-16 DIAGNOSIS — Z7984 Long term (current) use of oral hypoglycemic drugs: Secondary | ICD-10-CM

## 2022-01-16 DIAGNOSIS — Z7901 Long term (current) use of anticoagulants: Secondary | ICD-10-CM

## 2022-01-16 DIAGNOSIS — Z8249 Family history of ischemic heart disease and other diseases of the circulatory system: Secondary | ICD-10-CM

## 2022-01-16 DIAGNOSIS — M160 Bilateral primary osteoarthritis of hip: Secondary | ICD-10-CM | POA: Diagnosis not present

## 2022-01-16 DIAGNOSIS — N289 Disorder of kidney and ureter, unspecified: Secondary | ICD-10-CM

## 2022-01-16 DIAGNOSIS — Z823 Family history of stroke: Secondary | ICD-10-CM

## 2022-01-16 DIAGNOSIS — R0602 Shortness of breath: Secondary | ICD-10-CM | POA: Diagnosis not present

## 2022-01-16 LAB — CBC WITH DIFFERENTIAL/PLATELET
Abs Immature Granulocytes: 0.03 10*3/uL (ref 0.00–0.07)
Basophils Absolute: 0 10*3/uL (ref 0.0–0.1)
Basophils Relative: 1 %
Eosinophils Absolute: 0.3 10*3/uL (ref 0.0–0.5)
Eosinophils Relative: 4 %
HCT: 39.9 % (ref 39.0–52.0)
Hemoglobin: 12.5 g/dL — ABNORMAL LOW (ref 13.0–17.0)
Immature Granulocytes: 0 %
Lymphocytes Relative: 13 %
Lymphs Abs: 1 10*3/uL (ref 0.7–4.0)
MCH: 29 pg (ref 26.0–34.0)
MCHC: 31.3 g/dL (ref 30.0–36.0)
MCV: 92.6 fL (ref 80.0–100.0)
Monocytes Absolute: 0.5 10*3/uL (ref 0.1–1.0)
Monocytes Relative: 7 %
Neutro Abs: 5.7 10*3/uL (ref 1.7–7.7)
Neutrophils Relative %: 75 %
Platelets: 133 10*3/uL — ABNORMAL LOW (ref 150–400)
RBC: 4.31 MIL/uL (ref 4.22–5.81)
RDW: 18.9 % — ABNORMAL HIGH (ref 11.5–15.5)
WBC: 7.5 10*3/uL (ref 4.0–10.5)
nRBC: 0 % (ref 0.0–0.2)

## 2022-01-16 LAB — COMPREHENSIVE METABOLIC PANEL
ALT: 17 U/L (ref 0–44)
AST: 18 U/L (ref 15–41)
Albumin: 4.3 g/dL (ref 3.5–5.0)
Alkaline Phosphatase: 77 U/L (ref 38–126)
Anion gap: 10 (ref 5–15)
BUN: 45 mg/dL — ABNORMAL HIGH (ref 8–23)
CO2: 24 mmol/L (ref 22–32)
Calcium: 9.9 mg/dL (ref 8.9–10.3)
Chloride: 108 mmol/L (ref 98–111)
Creatinine, Ser: 2.04 mg/dL — ABNORMAL HIGH (ref 0.61–1.24)
GFR, Estimated: 32 mL/min — ABNORMAL LOW (ref >60)
Glucose, Bld: 191 mg/dL — ABNORMAL HIGH (ref 70–99)
Potassium: 4.3 mmol/L (ref 3.5–5.1)
Sodium: 142 mmol/L (ref 135–145)
Total Bilirubin: 1.1 mg/dL (ref 0.3–1.2)
Total Protein: 7.4 g/dL (ref 6.5–8.1)

## 2022-01-16 LAB — BRAIN NATRIURETIC PEPTIDE: B Natriuretic Peptide: 992 pg/mL — ABNORMAL HIGH (ref 0.0–100.0)

## 2022-01-16 LAB — D-DIMER, QUANTITATIVE: D-Dimer, Quant: 0.49 ug/mL-FEU (ref 0.00–0.50)

## 2022-01-16 MED ORDER — ICOSAPENT ETHYL 1 G PO CAPS
2.0000 g | ORAL_CAPSULE | Freq: Two times a day (BID) | ORAL | Status: DC
  Administered 2022-01-16 – 2022-01-23 (×14): 2 g via ORAL
  Filled 2022-01-16 (×14): qty 2

## 2022-01-16 MED ORDER — FOLIC ACID 1 MG PO TABS
2.0000 mg | ORAL_TABLET | Freq: Every day | ORAL | Status: DC
  Administered 2022-01-17 – 2022-01-23 (×7): 2 mg via ORAL
  Filled 2022-01-16 (×7): qty 2

## 2022-01-16 MED ORDER — SODIUM CHLORIDE 0.9% FLUSH
3.0000 mL | Freq: Two times a day (BID) | INTRAVENOUS | Status: DC
  Administered 2022-01-16 – 2022-01-23 (×14): 3 mL via INTRAVENOUS

## 2022-01-16 MED ORDER — METOPROLOL TARTRATE 25 MG PO TABS
12.5000 mg | ORAL_TABLET | Freq: Two times a day (BID) | ORAL | Status: DC
  Administered 2022-01-16 – 2022-01-17 (×2): 12.5 mg via ORAL
  Filled 2022-01-16 (×2): qty 1

## 2022-01-16 MED ORDER — APIXABAN 2.5 MG PO TABS
2.5000 mg | ORAL_TABLET | Freq: Two times a day (BID) | ORAL | Status: DC
  Administered 2022-01-16 – 2022-01-23 (×14): 2.5 mg via ORAL
  Filled 2022-01-16 (×14): qty 1

## 2022-01-16 MED ORDER — POTASSIUM CHLORIDE CRYS ER 20 MEQ PO TBCR
40.0000 meq | EXTENDED_RELEASE_TABLET | Freq: Two times a day (BID) | ORAL | Status: DC
  Administered 2022-01-16 – 2022-01-19 (×6): 40 meq via ORAL
  Filled 2022-01-16 (×6): qty 2

## 2022-01-16 MED ORDER — ACETAMINOPHEN 325 MG PO TABS
650.0000 mg | ORAL_TABLET | ORAL | Status: DC | PRN
  Administered 2022-01-16 – 2022-01-22 (×5): 650 mg via ORAL
  Filled 2022-01-16 (×5): qty 2

## 2022-01-16 MED ORDER — ALLOPURINOL 300 MG PO TABS
450.0000 mg | ORAL_TABLET | Freq: Every day | ORAL | Status: DC
  Administered 2022-01-17 – 2022-01-23 (×7): 450 mg via ORAL
  Filled 2022-01-16 (×7): qty 2

## 2022-01-16 MED ORDER — FUROSEMIDE 10 MG/ML IJ SOLN
40.0000 mg | Freq: Two times a day (BID) | INTRAMUSCULAR | Status: DC
  Administered 2022-01-17: 40 mg via INTRAVENOUS
  Filled 2022-01-16: qty 4

## 2022-01-16 MED ORDER — ONDANSETRON HCL 4 MG/2ML IJ SOLN: 4.0000 mg | Freq: Four times a day (QID) | INTRAMUSCULAR | Status: DC | PRN

## 2022-01-16 MED ORDER — EZETIMIBE 10 MG PO TABS
10.0000 mg | ORAL_TABLET | Freq: Every day | ORAL | Status: DC
  Administered 2022-01-17 – 2022-01-23 (×7): 10 mg via ORAL
  Filled 2022-01-16 (×7): qty 1

## 2022-01-16 MED ORDER — SODIUM CHLORIDE 0.9% FLUSH: 3.0000 mL | INTRAVENOUS | Status: DC | PRN

## 2022-01-16 MED ORDER — ATORVASTATIN CALCIUM 40 MG PO TABS
80.0000 mg | ORAL_TABLET | Freq: Every day | ORAL | Status: DC
  Administered 2022-01-16 – 2022-01-22 (×7): 80 mg via ORAL
  Filled 2022-01-16 (×7): qty 2

## 2022-01-16 MED ORDER — PREGABALIN 75 MG PO CAPS
150.0000 mg | ORAL_CAPSULE | Freq: Two times a day (BID) | ORAL | Status: DC
  Administered 2022-01-16 – 2022-01-23 (×14): 150 mg via ORAL
  Filled 2022-01-16: qty 3
  Filled 2022-01-16 (×13): qty 2

## 2022-01-16 MED ORDER — SODIUM CHLORIDE 0.9 % IV SOLN: 250.0000 mL | INTRAVENOUS | Status: DC | PRN

## 2022-01-16 MED ORDER — FENOFIBRATE 160 MG PO TABS
160.0000 mg | ORAL_TABLET | Freq: Every day | ORAL | Status: DC
  Administered 2022-01-17 – 2022-01-23 (×7): 160 mg via ORAL
  Filled 2022-01-16 (×8): qty 1

## 2022-01-16 MED ORDER — FUROSEMIDE 10 MG/ML IJ SOLN
60.0000 mg | Freq: Once | INTRAMUSCULAR | Status: AC
  Administered 2022-01-16: 60 mg via INTRAVENOUS
  Filled 2022-01-16: qty 8

## 2022-01-16 NOTE — ED Triage Notes (Signed)
Pt arrived via POV,c/o abnormal lab, was told his BNP was elevated, told to come to ED

## 2022-01-16 NOTE — Progress Notes (Signed)
Right lower extremity venous duplex has been completed. Preliminary results can be found in CV Proc through chart review.   01/16/22 5:43 PM Richard [name] RVT

## 2022-01-16 NOTE — Assessment & Plan Note (Addendum)
Echo performed roughly 1 month ago demonstrated EF of 30%. Repeat echo ordered due to worsening of symptoms despite diuresis. It is not much changed.The patient has been admitted to a telemetry bed. Cardiology and the advance heart failure team have been consulted. Will monitor troponins. EKG demonstrates a paced rhythm due to pacemaker placed due to complete heart block. He received 60 mg IV lasix in the ED.   Monitor strict I's and O's. He will receive lasix 60 mg IV BID beginning tomorrow am. Will monitor creatinine and electrolytes as well. He is maintaining a negative fluid balance. Cardiology has been consulted. I appreciate their assistance.   No ACE as the patient's blood pressure and creatinine would not tolerate it.

## 2022-01-16 NOTE — ED Provider Notes (Signed)
[facility] DEPT Provider Note   CSN: [id number] Arrival date & time: 01/16/22  1109     History  Chief Complaint  Patient presents with   Abnormal Lab    Richard [name] is a 81 y.o. male.  HPI Patient presents with a companion who assists with the history.  He presents after being made aware of abnormal lab results from a draw yesterday.  Patient also complains of dyspnea, worsening over the past weeks and spite of taking his Bumex, with increased dosing starting last week.  No pain at rest, minimal dyspnea at rest, though there is some of this. No fever, nausea, vomiting.  Yesterday saw his pulmonologist, and as part of blood draw was found to have BNP that is elevated.  He was sent here for evaluation.    Home Medications Prior to Admission medications   Medication Sig Start Date End Date Taking? Authorizing Provider  acetaminophen (TYLENOL) 325 MG tablet Take 650 mg by mouth at bedtime as needed for moderate pain or headache.   Yes [provider]  allopurinol (ZYLOPRIM) 300 MG tablet Take 450 mg by mouth daily.   Yes [provider]  aluminum hydroxide-magnesium carbonate (GAVISCON) 95-358 MG/15ML SUSP Take 15 mLs by mouth as needed for indigestion or heartburn.   Yes [provider]  amLODipine (NORVASC) 5 MG tablet Take 5 mg by mouth in the morning and at bedtime. 12/26/21  Yes Minus Breeding, MD  apixaban (ELIQUIS) 2.5 MG TABS tablet Take 1 tablet (2.5 mg total) by mouth 2 (two) times daily. 08/17/21  Yes Minus Breeding, MD  atorvastatin (LIPITOR) 80 MG tablet TAKE 1 TABLET BY MOUTH EVERYDAY AT BEDTIME 12/21/21  Yes Hochrein, Jeneen Rinks, MD  azelastine (ASTELIN) 0.1 % nasal spray PLACE 2 SPRAYS INTO BOTH NOSTRILS AT BEDTIME AS NEEDED FOR RHINITIS OR ALLERGIES. 08/03/21  Yes [name], DO  Coenzyme Q10 (CO Q-10 PO) Take 1 capsule by mouth daily.   Yes [provider]  folic acid (FOLVITE) 1 MG tablet TAKE 2  TABLETS BY MOUTH EVERY DAY 06/21/21  Yes [name], MD  metFORMIN (GLUCOPHAGE) 500 MG tablet Take 1 tablet (500 mg total) by mouth 2 (two) times daily with a meal. 01/08/22  Yes Roma Schanz R, DO  metoprolol tartrate (LOPRESSOR) 25 MG tablet TAKE 1/2 TABLET BY MOUTH TWICE DAILY Patient taking differently: Take 12.5 mg by mouth in the morning and at bedtime. 12/21/21  Yes Hochrein, Jeneen Rinks, MD  nitroGLYCERIN (NITROSTAT) 0.4 MG SL tablet PLACE 1 TABLET (0.4 MG TOTAL) UNDER THE TONGUE EVERY 5 (FIVE) MINUTES AS NEEDED FOR CHEST PAIN. 06/27/21  Yes Minus Breeding, MD  psyllium (METAMUCIL) 58.6 % powder Take 1 packet by mouth daily as needed (constipation).   Yes [provider]  torsemide (DEMADEX) 20 MG tablet Take 40 mg by mouth in the morning. 04/27/20  Yes [provider]  dapagliflozin propanediol (FARXIGA) 10 MG TABS tablet Take 10 mg by mouth daily. 11/22/20   [provider]  esomeprazole (NEXIUM) 40 MG capsule Take 1 capsule (40 mg total) by mouth daily. Patient taking differently: Take 20 mg by mouth daily. 10/15/12   [name], MD  eye wash (,SODIUM/POTASSIUM/SOD CHLORIDE,) SOLN Place 1 drop into both eyes at bedtime. [place]    [provider]  ezetimibe (ZETIA) 10 MG tablet TAKE 1 TABLET BY MOUTH EVERY DAY 12/27/21   Minus Breeding, MD  fenofibrate 160 MG tablet  TAKE 1 TABLET BY MOUTH EVERY DAY 11/12/21   Carollee Herter, Yvonne R, DO  FREESTYLE LITE test strip USE TO TEST BLOOD SUGAR ONCE A DAY. DX CODE: E11.9 06/21/21   Carollee Herter, Kendrick Fries R, DO  glimepiride (AMARYL) 1 MG tablet TAKE 1 TABLET BY MOUTH EVERY DAY WITH BREAKFAST Patient taking differently: Take 1 mg by mouth daily with breakfast. 10/02/21   Carollee Herter, Alferd Apa, DO  icosapent Ethyl (VASCEPA) 1 g capsule TAKE 2 CAPSULES BY MOUTH TWICE A DAY 11/05/21   Lowne Chase, Yvonne R, DO  KLOR-CON M20 20 MEQ tablet TAKE 2 TABLETS (40 MEQ TOTAL) BY MOUTH 2 (TWO) TIMES  DAILY. 04/02/21   Minus Breeding, MD  Lancets (FREESTYLE) lancets USE ONCE A DAY TO CHECK BLOOD SUGAR. 10/09/21   Carollee Herter, Alferd Apa, DO  Menthol-Camphor (ICY HOT ADVANCED PAIN RELIEF EX) Apply 1 application topically daily. Roll-on    [provider]  pregabalin (LYRICA) 150 MG capsule Take 1 capsule (150 mg total) by mouth 2 (two) times daily. 08/16/21   [name], DO      Allergies    Ace inhibitors, Benazepril, Entresto [sacubitril-valsartan], Hctz [hydrochlorothiazide], and Aspirin    Review of Systems   Review of Systems  All other systems reviewed and are negative.   Physical Exam Updated Vital Signs BP 124/75 (BP Location: Left Arm)   Pulse 78   Temp 98 F (36.7 C) (Oral)   Resp 17   SpO2 94%  Physical Exam Vitals and nursing note reviewed.  Constitutional:      General: He is not in acute distress.    Appearance: He is well-developed.  HENT:     Head: Normocephalic and atraumatic.  Eyes:     Conjunctiva/sclera: Conjunctivae normal.  Cardiovascular:     Rate and Rhythm: Normal rate and regular rhythm.  Pulmonary:     Effort: No respiratory distress.     Breath sounds: Decreased air movement present.  Abdominal:     General: There is no distension.  Musculoskeletal:     Right lower leg: Edema present.     Left lower leg: Edema present.  Skin:    General: Skin is warm and dry.  Neurological:     Mental Status: He is alert and oriented to person, place, and time.     ED Results / Procedures / Treatments   Labs (all labs ordered are listed, but only abnormal results are displayed) Labs Reviewed  COMPREHENSIVE METABOLIC PANEL - Abnormal; Notable for the following components:      Result Value   Glucose, Bld 191 (*)    BUN 45 (*)    Creatinine, Ser 2.04 (*)    GFR, Estimated 32 (*)    All other components within normal limits  CBC WITH DIFFERENTIAL/PLATELET - Abnormal; Notable for the following components:   Hemoglobin 12.5 (*)     RDW 18.9 (*)    Platelets 133 (*)    All other components within normal limits  BRAIN NATRIURETIC PEPTIDE - Abnormal; Notable for the following components:   B Natriuretic Peptide 992.0 (*)    All other components within normal limits    EKG EKG Interpretation  Date/Time:  Wednesday January 16 2022 12:15:38 EDT Ventricular Rate:  79 PR Interval:  155 QRS Duration: 149 QT Interval:  424 QTC Calculation: 487 R Axis:   116 Text Interpretation: VENTRICULAR PACED RHYTHM Abnormal ECG Confirmed by Carmin Muskrat [id number] on 01/16/2022 12:36:42 PM  Radiology DG  Chest 2 View  Result Date: 01/16/2022 CLINICAL DATA:  sob EXAM: CHEST - 2 VIEW COMPARISON:  None Available. FINDINGS: Mild enlargement of the cardio mediastinal silhouette, stable. Sternotomy wires, aortic valve replacement changes and aortic arch atherosclerotic calcifications, stable. Again seen are the mild pulmonary vascular congestion and interstitial changes under pulmonary more prominent at the lung bases, stable. No focal consolidation. Trace bilateral pleural effusion. The visualized skeletal structures are unremarkable. IMPRESSION: There has been no significant interval change in the cardiomegaly, pulmonary vascular congestion, interstitial changes and likely trace bilateral pleural effusion. Electronically Signed   By: Frazier Richards M.D.   On: 01/16/2022 11:55   DG Chest 2 View  Result Date: 01/15/2022 CLINICAL DATA:  Pulmonary edema. EXAM: CHEST - 2 VIEW COMPARISON:  Chest radiographs 12/26/2021 FINDINGS: Sequelae of transcatheter aortic valve replacement are again identified. The cardiac silhouette remains mildly enlarged. Aortic atherosclerosis and a leadless pacemaker are again noted. Pulmonary vascular congestion and mild diffuse interstitial prominence are similar to the prior study, and there are persistent trace bilateral pleural effusions. No pneumothorax is identified. No acute osseous abnormality is seen. IMPRESSION:  Unchanged findings of mild interstitial edema and trace pleural effusions. Electronically Signed   By: Logan Bores M.D.   On: 01/15/2022 11:33    Procedures Procedures    Medications Ordered in ED Medications  furosemide (LASIX) injection 60 mg (60 mg Intravenous Given 01/16/22 1341)    ED Course/ Medical Decision Making/ A&P This patient with a Hx of heart failure, respiratory disease, hypertension presents to the ED for concern of dyspnea, edema, this involves an extensive number of treatment options, and is a complaint that carries with it a high risk of complications and morbidity.    The differential diagnosis includes failure exacerbation, pneumonia, hepatic or renal dysfunction   Social Determinants of Health:  Advanced age  Additional history obtained:  Additional history and/or information obtained from companion at bedside and clinic notes, notable for PA details above, clinic notes from yesterday suggesting worsening renal function with pulmonary congestion   Echo: Echo showed reduced LV function at 30-35% from prior normal along with wall motion abnormality suggesting blood flow issue on LAD territory (main artery of the heart). However stress test did without evidence of ischemia or infraction. Read high risk due to low EF.    After the initial evaluation, orders, including: Labs x-ray ECG were initiated.   Patient placed on Cardiac and Pulse-Oximetry Monitors. The patient was maintained on a cardiac monitor.  The cardiac monitored showed an rhythm of 80 sinus The patient was also maintained on pulse oximetry. The readings were typically 100% room air normal   On repeat evaluation of the patient stayed the same  Lab Tests:  I personally interpreted labs.  The pertinent results include: Consistent with heart failure exacerbation  Imaging Studies ordered:  I independently visualized and interpreted imaging which showed pulmonary congestion I agree with the  radiologist interpretation  Consultations Obtained:  I requested consultation with the cardiology,  and discussed lab and imaging findings as well as pertinent plan - they recommend: Diuresis IV admission  Dispostion / Final MDM:  After consideration of the diagnostic results and the patient's response to treatment, adult male with multiple medical issues including pulmonary and cardiac disease as well as CKD presents with worsening dyspnea is found to have evidence for acute heart failure exacerbation, refractory to his increased dosing of Bumex over the past few days. Patient is afebrile, there is no early  evidence for concurrent pneumonia, or bacteremia.  Patient's are on IV diuretics, and after consultation with cardiology was admitted for further monitoring, management.  Final Clinical Impression(s) / ED Diagnoses Final diagnoses:  Acute on chronic systolic congestive heart failure ([place])  Renal dysfunction     Carmin Muskrat, MD 01/16/22 1529

## 2022-01-16 NOTE — ED Notes (Signed)
Floor made aware that a purple man is needed.

## 2022-01-16 NOTE — Progress Notes (Signed)
Notified patient that his CXR showed persistent pulmonary edema/volume overload. His BMET showed that his kidney function has declined and his BNP was significantly elevated to 1,211, indicating he is in acute congestive heart failure. I do not think he is safe to wait for OV with cardiology on Monday, given his imaging, worsening kidney function, persistent dyspnea, and significant LE edema. Advised that he go to the ED for further management. He will need IV diuretic therapy and close monitoring of his kidney function/electrolytes. Verbalized understanding and agreed to go today.

## 2022-01-16 NOTE — H&P (Signed)
History and Physical    Patient: Richard [name] WNI:[id number] DOB: [birth year] DOA: 01/16/2022 DOS: the patient was seen and examined on 01/16/2022 PCP: [name], DO  Patient coming from: Home  Chief Complaint:  Chief Complaint  Patient presents with   Abnormal Lab   HPI: Richard [name] is a 81 y.o. male with medical history significant of CHF, Bradycardia (pacemaker), Diffuse large B cell lymphoma (s/p chemo), Esophageal stricture, GERD, gout, HTN, Hyperlipidemia, OSA on CPAP, squamous cell carcinoma of skin, Severe aortic stenosis, DM II.   The patient states that he has been having increasing dyspnea with exertion for the past 3-6 months. He states that several months ago a nephrologist took him off of his lasix and switched him to Torsemide stating that the Lasix was going to burn up his kidneys. This dose of torsemide was continued until last week when the dose was increased by his cardiologist. The patient lost 5 lbs after increased dose. However he continue to become increasingly short of breath with exertion. The patient underwent an echocardiogram and a nuclear medicine stress that demonstrated an EF of 30-35%, but no ischemia, although it is documented that the patient is felt to be high risk for ischemia. Echocardiogram also demonstrated EF of 30%.  The patient followed up with pulmonology this week due to his ongoing shortness of breath. CXR demonstrated pulmonary vascular congestion with some pleural effusion. At this point his diuretic had been increased. Labs were drawn. The patient's creatinine (baseline appears to be 1.3 - 1.5 is now 2.11. His BNP was greater than 1200 yesterday, although today it is down to 998.  Upon further questioning the patient denies any chest pain. He does admit to orthopnea, lower extremity swelling (right leg greater than left), and hemoptysis. No nausea, vomiting, constipation, diarrhea, hematemesis, melena, coffee ground emesis, or melena.  No fevers or chills.   In the ED the patient is found to have no change in the appearance of his pulmonary vascular congestion. Vital signs are stable, and he is saturating in the low to mid 90's on room air. EKG is unchanged from previous. As previously stated the patient's creatinine has increased to 2.11 with a EGFR of 32. Glucose is 191. BNP os 992, and platelets are 133.   The patient has received 60 mg IV lasix in the ED.  D Dimer, VQ scan and Doppler of the right lower extremity have been ordered and are pending.  [facility] hospitalists have been consulted to admit the patient for further evaluation and treatment. Cardiology and the advanced heart team have been consulted.  Review of Systems: As mentioned in the history of present illness. All other systems reviewed and are negative. Past Medical History:  Diagnosis Date   Anemia    Arthritis    hips   Axillary adenopathy 02/25/2017   Bradycardia    a. holter monitor has demonstrated HRs in 30s and [place]ink[place]ach    CAD (coronary artery disease)    a. s/p CABG 2001  b.  07/28/2017 cath:   Severe three-vessel native CAD with total occlusion of LAD, ramus intermedius, first OM and RCA, patent RIMA to PDA, LIMA to LAD, sequential SVG to ramus intermedius and first OM.     Chronic lower back pain    Diffuse large B cell lymphoma (HCC)    Diverticulosis    Esophageal stricture    GERD (gastroesophageal reflux disease)    History of gout    HTN (hypertension)  Mixed hyperlipidemia    OSA on CPAP    with 2L O2 at night   Pancytopenia (HCC)    a. related to chemo therapy for B cell lymphoma   Peptic stricture of esophagus    Presence of permanent cardiac pacemaker    sees Dr. Valaria Good pacemaker   SCC (squamous cell carcinoma) 01/31/2021   R zygoma, [facility]   SCC (squamous cell carcinoma) 01/31/2021   L post ankle, [facility]   SCC (squamous cell carcinoma) 01/31/2021   L popliteal, [facility]   Severe aortic stenosis    Spinal stenosis     Squamous cell carcinoma of skin 03/22/2009   Right mandible. SCCis, hypertrophic.    Squamous cell carcinoma of skin 12/25/2021   R lateral cheek, ED[facility]   Squamous cell carcinoma of skin 12/25/2021   R postauricular neck, ED[facility]   Squamous cell carcinoma of skin 12/25/2021   R forearm sup, ED[facility]   Squamous cell carcinoma of skin 12/25/2021   R wrist, [facility]   Type II diabetes mellitus ([place])    Wears dentures    partial upper   Past Surgical History:  Procedure Laterality Date   APPENDECTOMY  ~ [place] Left 11/06/2016   Procedure: CATARACT EXTRACTION PHACO AND INTRAOCULAR LENS PLACEMENT ([place]);  Surgeon: Estill Cotta, MD;  Location: [facility];  Service: Ophthalmology;  Laterality: Left;  Lot # [id number] H [place]: 01:09.4 AP%:25.2 CDE: 30.64   CATARACT EXTRACTION W/PHACO Right 12/04/2016   Procedure: CATARACT EXTRACTION PHACO AND INTRAOCULAR LENS PLACEMENT ([facility]);  Surgeon: Estill Cotta, MD;  Location: [facility];  Service: Ophthalmology;  Laterality: Right;  [place] 1:25.9 AP% 24.1 CDE 39.10 Fluid Pack lot # [id number]   COLONOSCOPY W/ BIOPSIES AND POLYPECTOMY  07/02/2011   CORONARY ANGIOPLASTY  07/02/1991   CORONARY ANGIOPLASTY WITH STENT PLACEMENT  05/31/1997   "1"   CORONARY ARTERY BYPASS GRAFT  03/01/2000   "CABG X5"   ECTROPION REPAIR Right 09/01/2018   Procedure: REPAIR OF ECTROPION BILATERAL upper and lower;  Surgeon: Karle Starch, MD;  Location: [place];  Service: Ophthalmology;  Laterality: Right;  Diabetic - oral meds sleep apnea   ESOPHAGEAL DILATION  X 3-4   Dr. Lyla Son; "last one was in the 1990's"   ESOPHAGOGASTRODUODENOSCOPY     multiple   FLEXIBLE SIGMOIDOSCOPY     multiple   HYDRADENITIS EXCISION Left 02/25/2017   Procedure: EXCISION DEEP LEFT AXILLARY LYMPH NODE;  Surgeon: Fanny Skates, MD;  Location: [place];  Service: General;  Laterality: Left;   INTRAOPERATIVE TRANSTHORACIC ECHOCARDIOGRAM N/A 09/09/2017    Procedure: INTRAOPERATIVE TRANSTHORACIC ECHOCARDIOGRAM;  Surgeon: Burnell Blanks, MD;  Location: [place];  Service: Open Heart Surgery;  Laterality: N/A;   KNEE ARTHROSCOPY Left 07/01/2009   meniscus repair   LEFT HEART CATHETERIZATION WITH CORONARY/GRAFT ANGIOGRAM N/A 03/16/2014   Procedure: LEFT HEART CATHETERIZATION WITH Beatrix Fetters;  Surgeon: Burnell Blanks, MD;  Location: [facility] CATH LAB;  Service: Cardiovascular;  Laterality: N/A;   LUMBAR LAMINECTOMY/DECOMPRESSION MICRODISCECTOMY Right 06/17/2013   Procedure: LUMBAR LAMINECTOMY MICRODISCECTOMY L4-L5 RIGHT EXCISION OF SYNOVIAL CYST RIGHT   (1 LEVEL) RIGHT PARTIAL FACETECTOMY;  Surgeon: Tobi Bastos, MD;  Location: [facility];  Service: Orthopedics;  Laterality: Right;   MYELOGRAM  04/06/2013   lumbar, Dr Gladstone Lighter   ORBITAL LESION EXCISION Right 09/01/2018   Procedure: ORBITOTOMY WITHOUT BONE FLAP WITH REMOVAL OF LESION RIGHT;  Surgeon: Karle Starch, MD;  [facility];  Service: Ophthalmology;  Laterality: Right;   PACEMAKER IMPLANT N/A 09/10/2017   Procedure: PACEMAKER IMPLANT;  Surgeon: Deboraha Sprang, MD;  Location: [place] [facility];  Service: Cardiovascular;  Laterality: N/A;   PANENDOSCOPY     PERIPHERAL VASCULAR BALLOON ANGIOPLASTY Left 03/16/2021   Procedure: PERIPHERAL VASCULAR BALLOON ANGIOPLASTY;  Surgeon: [name], MD;  Location: [place] [facility];  Service: Cardiovascular;  Laterality: Left;  subclavian  vein   PORTACATH PLACEMENT N/A 03/06/2017   Procedure: INSERTION PORT-A-CATH AND ASPIRATE SEROMA LEFT AXILLA;  Surgeon: Fanny Skates, MD;  Location: [place];  Service: General;  Laterality: N/A;   RIGHT/LEFT HEART CATH AND CORONARY/GRAFT ANGIOGRAPHY N/A 07/28/2017   Procedure: RIGHT/LEFT HEART CATH AND CORONARY/GRAFT ANGIOGRAPHY;  Surgeon: Sherren Mocha, MD;  Location: [place] [facility];  Service: Cardiovascular;  Laterality: N/A;   SHOULDER SURGERY Right 08/30/2010    screws placed; "tendons tore off"   SKIN CANCER EXCISION Right    "neck"   TONSILLECTOMY  ~ [place] Left 06/29/2019   Procedure: TOTAL KNEE ARTHROPLASTY;  Surgeon: Paralee Cancel, MD;  Location: [facility];  Service: Orthopedics;  Laterality: Left;  70 mins   TRANSCATHETER AORTIC VALVE REPLACEMENT, TRANSFEMORAL N/A 09/09/2017   Procedure: TRANSCATHETER AORTIC VALVE REPLACEMENT, TRANSFEMORAL;  Surgeon: Burnell Blanks, MD;  Location: [place];  Service: Open Heart Surgery;  Laterality: N/A;   UPPER EXTREMITY VENOGRAPHY Left 02/15/2021   Procedure: CENTRAL VENO;  Surgeon: [name], MD;  Location: [place] [facility];  Service: Cardiovascular;  Laterality: Left;   UPPER EXTREMITY VENOGRAPHY N/A 03/16/2021   Procedure: UPPER EXTREMITY VENOGRAPHY;  Surgeon: [name], MD;  Location: [place] [facility];  Service: Cardiovascular;  Laterality: N/A;   UPPER GI ENDOSCOPY  07/02/2011   Gastritis; Dr Carlean Purl   VASECTOMY     wireless pacemaker placed     Social History:  reports that he has never smoked. He has never used smokeless tobacco. He reports that he does not drink alcohol and does not use drugs.  Allergies  Allergen Reactions   Ace Inhibitors Swelling and Other (See Comments)    Angioedema    Benazepril Swelling and Other (See Comments)    Angioedema; he is not a candidate for any angiotensin receptor blockers because of this significant allergic reaction. Because of a history of documented adverse serious drug reaction;Medi Alert bracelet  is recommended   Entresto [Sacubitril-Valsartan] Swelling and Other (See Comments)    First-in-Class Angiotensin Receptor Neprilysin Inhibitor- Med was "red-flagged" by the patient's pharmacy for him to NOT take!   Hctz [Hydrochlorothiazide] Anaphylaxis and Swelling    Tongue and lip swelling    Aspirin Other (See Comments)    Gastritis, can aspirin not take 325 mg aspirin    Family History  Problem Relation  Age of Onset   Stroke Father    Hypertension Father    Pancreatic cancer Mother    Diabetes Maternal Grandmother    Stroke Maternal Grandmother    Heart attack Paternal Grandmother    Colon cancer Neg Hx    Esophageal cancer Neg Hx    Rectal cancer Neg Hx    Stomach cancer Neg Hx    Ulcers Neg Hx     Prior to Admission medications   Medication Sig Start Date End Date Taking? Authorizing Provider  acetaminophen (TYLENOL) 325 MG tablet Take 650 mg by mouth at bedtime as needed for moderate pain or headache.  Yes [provider]  allopurinol (ZYLOPRIM) 300 MG tablet Take 450 mg by mouth daily.   Yes [provider]  aluminum hydroxide-magnesium carbonate (GAVISCON) 95-358 MG/15ML SUSP Take 15 mLs by mouth as needed for indigestion or heartburn.   Yes [provider]  amLODipine (NORVASC) 5 MG tablet Take 5 mg by mouth in the morning and at bedtime. 12/26/21  Yes Minus Breeding, MD  apixaban (ELIQUIS) 2.5 MG TABS tablet Take 1 tablet (2.5 mg total) by mouth 2 (two) times daily. 08/17/21  Yes Minus Breeding, MD  Ascorbic Acid (VITAMIN C PO) Take 1 tablet by mouth daily with breakfast.   Yes [provider]  atorvastatin (LIPITOR) 80 MG tablet TAKE 1 TABLET BY MOUTH EVERYDAY AT BEDTIME Patient taking differently: Take 80 mg by mouth at bedtime. 12/21/21  Yes Minus Breeding, MD  azelastine (ASTELIN) 0.1 % nasal spray PLACE 2 SPRAYS INTO BOTH NOSTRILS AT BEDTIME AS NEEDED FOR RHINITIS OR ALLERGIES. 08/03/21  Yes [name], DO  Coenzyme Q10 (CO Q-10 PO) Take 1 capsule by mouth daily.   Yes [provider]  esomeprazole (NEXIUM) 40 MG capsule Take 1 capsule (40 mg total) by mouth daily. Patient taking differently: Take 40 mg by mouth daily before breakfast. 10/15/12  Yes [name], MD  ezetimibe (ZETIA) 10 MG tablet TAKE 1 TABLET BY MOUTH EVERY DAY Patient taking differently: Take 10 mg by mouth daily. 12/27/21  Yes Hochrein, Jeneen Rinks, MD   FARXIGA 10 MG TABS tablet Take 10 mg by mouth in the morning.   Yes [provider]  fenofibrate 160 MG tablet TAKE 1 TABLET BY MOUTH EVERY DAY Patient taking differently: Take 160 mg by mouth daily. 11/12/21  Yes [name], DO  folic acid (FOLVITE) 1 MG tablet TAKE 2 TABLETS BY MOUTH EVERY DAY Patient taking differently: Take 2 mg by mouth in the morning. 06/21/21  Yes Ennever, Rudell Cobb, MD  glimepiride (AMARYL) 1 MG tablet TAKE 1 TABLET BY MOUTH EVERY DAY WITH BREAKFAST Patient taking differently: Take 1 mg by mouth daily with breakfast. 10/02/21  Yes Lowne Chase, Alferd Apa, DO  icosapent Ethyl (VASCEPA) 1 g capsule TAKE 2 CAPSULES BY MOUTH TWICE A DAY Patient taking differently: Take 2 g by mouth in the morning and at bedtime. 11/05/21  Yes Lowne Chase, Yvonne R, DO  KLOR-CON M20 20 MEQ tablet TAKE 2 TABLETS (40 MEQ TOTAL) BY MOUTH 2 (TWO) TIMES DAILY. Patient taking differently: Take 40 mEq by mouth in the morning and at bedtime. 04/02/21  Yes Hochrein, Jeneen Rinks, MD  Menthol-Camphor (ICY HOT ADVANCED PAIN RELIEF EX) Apply 1 application  topically daily as needed (to painful sites).   Yes [provider]  metFORMIN (GLUCOPHAGE) 500 MG tablet Take 1 tablet (500 mg total) by mouth 2 (two) times daily with a meal. 01/08/22  Yes Roma Schanz R, DO  metoprolol tartrate (LOPRESSOR) 25 MG tablet TAKE 1/2 TABLET BY MOUTH TWICE DAILY Patient taking differently: Take 12.5 mg by mouth in the morning and at bedtime. 12/21/21  Yes Hochrein, Jeneen Rinks, MD  nitroGLYCERIN (NITROSTAT) 0.4 MG SL tablet PLACE 1 TABLET (0.4 MG TOTAL) UNDER THE TONGUE EVERY 5 (FIVE) MINUTES AS NEEDED FOR CHEST PAIN. 06/27/21  Yes Minus Breeding, MD  pregabalin (LYRICA) 150 MG capsule Take 1 capsule (150 mg total) by mouth 2 (two) times daily. Patient taking differently: Take 150 mg by mouth in the morning and at bedtime. 08/16/21  Yes Carollee Herter,  Yvonne R, DO  psyllium (METAMUCIL) 58.6 % powder Take 1 packet  by mouth daily as needed (for constipation- mix and drink).   Yes [provider]  torsemide (DEMADEX) 20 MG tablet Take 40 mg by mouth in the morning. 04/27/20  Yes [provider]  FREESTYLE LITE test strip USE TO TEST BLOOD SUGAR ONCE A DAY. DX CODE: E11.9 06/21/21   Carollee Herter, Kendrick Fries R, DO  Lancets (FREESTYLE) lancets USE ONCE A DAY TO CHECK BLOOD SUGAR. 10/09/21   [name], DO    Physical Exam: Vitals:   01/16/22 1440 01/16/22 1445 01/16/22 1500 01/16/22 1653  BP: 124/75 (!) 149/86 (!) 153/97 (!) 155/97  Pulse: 78 78 74 74  Resp: 17 16 (!) 23 14  Temp:    97.7 F (36.5 C)  TempSrc:    Oral  SpO2: 94% 95% 94% 92%   Exam:  Constitutional:  The patient is awake, alert, and oriented x 3. No acute distress. Eyes:  pupils and irises appear normal Normal lids and conjunctivae ENMT:  grossly normal hearing  Lips appear normal external ears, nose appear normal Oropharynx: mucosa, tongue,posterior pharynx appear normal Neck:  neck appears normal, no masses, normal ROM, supple no thyromegaly Respiratory:  No increased work of breathing. No wheezes, rales, or rhonchi No tactile fremitus Cardiovascular:  Regular rate and rhythm No murmurs, ectopy, or gallups. No lateral PMI. No thrills. Abdomen:  Abdomen is soft, non-tender, non-distended No hernias, masses, or organomegaly Normoactive bowel sounds.  Musculoskeletal:  No cyanosis, clubbing, 4+ edema in the right lower extremity. 2-3+ pitting edema in the left lower extremity. Skin:  No rashes, lesions, ulcers palpation of skin: no induration or nodules Neurologic:  CN 2-12 intact Sensation all 4 extremities intact Psychiatric:  Mental status Mood, affect appropriate Orientation to person, place, time  judgment and insight appear intact  Data Reviewed: CBC, BMP, EKG, CXR, Echocardiogram, Vitals.  Assessment and Plan: Problem  Acute Exacerbation of Chf (Congestive Heart Failure)  (Hcc)  Hemoptysis  Cardiac Pacemaker in Situ  Uncontrolled Type 2 Diabetes Mellitus With Hyperglycemia (Hcc)  Acute On Chronic Diastolic Chf (Congestive Heart Failure), Ny[place] Class 2 (Hcc)  Aortic Stenosis, Severe  Anemia, Iron Deficiency  Osa (Obstructive Sleep Apnea)  Essential Hypertension        Advance Care Planning:   Code Status: Full Code   Consults:  Cardiology   Advanced heart failure team   Nephrology  Family Communication: Family at bedside.  Severity of Illness: The appropriate patient status for this patient is INPATIENT. Inpatient status is judged to be reasonable and necessary in order to provide the required intensity of service to ensure the patient's safety. The patient's presenting symptoms, physical exam findings, and initial radiographic and laboratory data in the context of their chronic comorbidities is felt to place them at high risk for further clinical deterioration. Furthermore, it is not anticipated that the patient will be medically stable for discharge from the hospital within 2 midnights of admission.   * I certify that at the point of admission it is my clinical judgment that the patient will require inpatient hospital care spanning beyond 2 midnights from the point of admission due to high intensity of service, high risk for further deterioration and high frequency of surveillance required.*  Author: Navin Dogan, DO 01/16/2022 5:33 PM  For on call review [email or web address].

## 2022-01-16 NOTE — ED Notes (Signed)
Pt provided dinner tray.

## 2022-01-17 ENCOUNTER — Inpatient Hospital Stay (HOSPITAL_COMMUNITY): Payer: Medicare Other

## 2022-01-17 ENCOUNTER — Other Ambulatory Visit: Payer: Self-pay

## 2022-01-17 DIAGNOSIS — D500 Iron deficiency anemia secondary to blood loss (chronic): Secondary | ICD-10-CM | POA: Diagnosis not present

## 2022-01-17 DIAGNOSIS — I5033 Acute on chronic diastolic (congestive) heart failure: Secondary | ICD-10-CM

## 2022-01-17 DIAGNOSIS — I5023 Acute on chronic systolic (congestive) heart failure: Secondary | ICD-10-CM | POA: Diagnosis not present

## 2022-01-17 DIAGNOSIS — Z950 Presence of cardiac pacemaker: Secondary | ICD-10-CM | POA: Diagnosis not present

## 2022-01-17 DIAGNOSIS — I350 Nonrheumatic aortic (valve) stenosis: Secondary | ICD-10-CM | POA: Diagnosis not present

## 2022-01-17 DIAGNOSIS — I5043 Acute on chronic combined systolic (congestive) and diastolic (congestive) heart failure: Secondary | ICD-10-CM | POA: Diagnosis not present

## 2022-01-17 LAB — BASIC METABOLIC PANEL
Anion gap: 11 (ref 5–15)
BUN: 43 mg/dL — ABNORMAL HIGH (ref 8–23)
CO2: 24 mmol/L (ref 22–32)
Calcium: 9.8 mg/dL (ref 8.9–10.3)
Chloride: 108 mmol/L (ref 98–111)
Creatinine, Ser: 1.78 mg/dL — ABNORMAL HIGH (ref 0.61–1.24)
GFR, Estimated: 37 mL/min — ABNORMAL LOW (ref >60)
Glucose, Bld: 96 mg/dL (ref 70–99)
Potassium: 4.1 mmol/L (ref 3.5–5.1)
Sodium: 143 mmol/L (ref 135–145)

## 2022-01-17 LAB — CBC WITH DIFFERENTIAL/PLATELET
Abs Immature Granulocytes: 0.03 10*3/uL (ref 0.00–0.07)
Basophils Absolute: 0.1 10*3/uL (ref 0.0–0.1)
Basophils Relative: 1 %
Eosinophils Absolute: 0.4 10*3/uL (ref 0.0–0.5)
Eosinophils Relative: 6 %
HCT: 39 % (ref 39.0–52.0)
Hemoglobin: 11.9 g/dL — ABNORMAL LOW (ref 13.0–17.0)
Immature Granulocytes: 0 %
Lymphocytes Relative: 18 %
Lymphs Abs: 1.2 10*3/uL (ref 0.7–4.0)
MCH: 28.3 pg (ref 26.0–34.0)
MCHC: 30.5 g/dL (ref 30.0–36.0)
MCV: 92.6 fL (ref 80.0–100.0)
Monocytes Absolute: 0.6 10*3/uL (ref 0.1–1.0)
Monocytes Relative: 9 %
Neutro Abs: 4.6 10*3/uL (ref 1.7–7.7)
Neutrophils Relative %: 66 %
Platelets: 131 10*3/uL — ABNORMAL LOW (ref 150–400)
RBC: 4.21 MIL/uL — ABNORMAL LOW (ref 4.22–5.81)
RDW: 18.7 % — ABNORMAL HIGH (ref 11.5–15.5)
WBC: 7 10*3/uL (ref 4.0–10.5)
nRBC: 0 % (ref 0.0–0.2)

## 2022-01-17 LAB — TROPONIN I (HIGH SENSITIVITY)
Troponin I (High Sensitivity): 34 ng/L — ABNORMAL HIGH (ref <18)
Troponin I (High Sensitivity): 30 ng/L — ABNORMAL HIGH (ref <18)

## 2022-01-17 LAB — ECHOCARDIOGRAM COMPLETE
AR max vel: 2.72 cm2
AV Peak grad: 14.4 mmHg
Ao pk vel: 1.9 m/s
Area-P 1/2: 3.07 cm2
Height: 72 in
MV M vel: 4.6 m/s
MV Peak grad: 84.6 mmHg
S' Lateral: 5.1 cm
Weight: 3985.92 oz

## 2022-01-17 LAB — BRAIN NATRIURETIC PEPTIDE: B Natriuretic Peptide: 969.2 pg/mL — ABNORMAL HIGH (ref 0.0–100.0)

## 2022-01-17 MED ORDER — METOPROLOL SUCCINATE ER 25 MG PO TB24
25.0000 mg | ORAL_TABLET | Freq: Every day | ORAL | Status: DC
  Administered 2022-01-18 – 2022-01-23 (×6): 25 mg via ORAL
  Filled 2022-01-17 (×6): qty 1

## 2022-01-17 MED ORDER — FUROSEMIDE 10 MG/ML IJ SOLN
60.0000 mg | Freq: Two times a day (BID) | INTRAMUSCULAR | Status: DC
  Administered 2022-01-17 – 2022-01-19 (×4): 60 mg via INTRAVENOUS
  Filled 2022-01-17 (×4): qty 6

## 2022-01-17 MED ORDER — TECHNETIUM TO 99M ALBUMIN AGGREGATED
4.4000 | Freq: Once | INTRAVENOUS | Status: AC
  Administered 2022-01-17: 4.4 via INTRAVENOUS

## 2022-01-17 MED ORDER — DAPAGLIFLOZIN PROPANEDIOL 10 MG PO TABS
10.0000 mg | ORAL_TABLET | Freq: Every day | ORAL | Status: DC
  Administered 2022-01-17 – 2022-01-23 (×7): 10 mg via ORAL
  Filled 2022-01-17 (×7): qty 1

## 2022-01-17 MED ORDER — ORAL CARE MOUTH RINSE: 15.0000 mL | OROMUCOSAL | Status: DC | PRN

## 2022-01-17 NOTE — Progress Notes (Signed)
  Transition of Care [facility]) Screening Note   Patient Details  Name: Richard [name] Date of Birth: 08-26-1940   Transition of Care [facility] CM/SW Contact:    Dessa Phi, RN Phone Number: 01/17/2022, 12:26 PM    Transition of Care Department [facility]) has reviewed patient and no TOC needs have been identified at this time. We will continue to monitor patient advancement through interdisciplinary progression rounds. If new patient transition needs arise, please place a TOC consult.

## 2022-01-17 NOTE — Consult Note (Addendum)
Cardiology Consultation:   Patient ID: Richard [name] MRN: [id number]; DOB: 21-Jul-1940  Admit date: 01/16/2022 Date of Consult: 01/17/2022  PCP:  [name], DO   [facility] Cardiologist:  Minus Breeding, MD  Electrophysiologist:  [name], MD  {   Patient Profile:   Richard [name] is a 81 y.o. male with a complex PMH of systolic and diastolic heart failure, CAD s/p CABG x5 2001, aortic stenosis s/p TAVR, CHB following TAVR s/p left sided subclavian PPM 08/2017,  left subclavian occlusion s/p PPM extraction with conversion to [place] pacemaker 08/20/21 at [facility], HTN,  permanent atrial fibrillation/flutter, severe angioedema with ACEi, AAA, dyslipidemia, OSA on CPAP, CKD III, type II DM, GI bleed 08/2017 from hemorrhoids, Diffuse large B-cell lymphoma s/p chemotherapy 2019, pernicious anemia, who is being seen 01/17/2022 for the evaluation of CHF exacerbation at the request of Dr. Benny Lennert.  History of Present Illness:   Richard [name] with above complex PMH presented to ER 01/16/22 for SOB.   In brief, he has CAD, underwent CABG x5 in 2001 by Dr Cyndia Bent.   He was diagnosed with diffuse large B cell lymphoma 2018 and completed 8 cycle of R-CHOP in 08/2017.  He is getting monthly B12 injection, Xgeva q78monthinjection,  and iron infusion periodically with hematology.   He was hospitalized for NSTEMI 07/2017, underwent LHC 07/28/17 showing severe three-vessel native CAD with total occlusion of LAD, ramus intermedius, first OM and RCA, patent RIMA to PDA, LIMA to LAD, sequential SVG to ramus intermedius and first OM. Severe calcific aortic stenosis with mean transvalvular gradient of 51 mmHg. He underwent TAVR with placement of a 26 mm [place] 3 valve on 3/12/ 2019 for severe aortic stenosis by Dr MAngelena Form Following TAVR, he developed 2:1 AVB and had a permanent pacemaker was placed on 09/10/17 by Dr KCaryl Comes He developed paroxysmal atrial fibrillation  subsequently where anticoagulation with Eliquis was started, along with ASA (post TAVR).  He did require diuresis for diastolic heart failure during that hospitalization. Follow up with structural heart team mentioned he was doing well with normal TAVR function.  He followed up with general cardiology Dr HPercival Spanishlater, noted to have persistent A fib/flutter, ASA was stopped and he was maintained on Eliquis.  He developed LUE swelling, underwent LUE venogram and angioplasty by Dr HStanford Breed9/16/22, noted to have critical stenosis of subclavian vein due to the pacemaker wires. [facility] [place] 08[place]20/21. He also underwent repeat left upper extremity venogram with balloon [facility] He had overall much improved LUE swelling since.   He was seen by cardiology APP 11/29/21 due to SOB with exertion, advised to increase torsemide for 3 days. Echo was updated on 12/18/21 showed reduced LVEF 30-35%, akinetic anterior/ anteroseptal wall from base to apex, mildly reduced RV, mild to moderate MR, no significant PVL, well seated prosthesis with Peak velocity 1.98 m/s. DI 0.32. Normal functioning prosthesis. Stress Myoview from 12/18/21 was high risk, consistent with no prior ischemia and no prior myocardial infarction. He was subsequently seen by Dr HPercival Spanishon 12/24/21, felt newly reduced EF may be due to leadless PPM, EP DR KCaryl Comeswas reached out for advice, and he was started on Entresto and stopped on Amlodipine for GDMT.   He had hemoptysis and was referred to pulmonology by PCP. Seen by pulmonology  01/08/22,  reportedly  had COVID 08/2021 and strep throat 10/2021, has increased SOB and 10 pound weight gain over the past few weeks. He was told to stop Eliquis if hemoptysis worsen. He was advised to increase torsemide '40mg'$  daily with concern of volume overload from heart  failure. He was seen by pulmonology again 01/15/22, lost 5 pounds, breathing is unchanged, has leg edema worsened, also has orthopnea. He still has morning hemoptysis unchanged. He was plan for adding metolazone but has allergy to HCTZ. He was sent to ER for evaluation.   Upon encounter, he states he has been SOB with exertion with leg edema for at least 1 yr.  His nephrologist switched him from lasix to torsemide 3-4 yrs ago. Initially it was helping leg edema. He noted less effect over the past year. He has not been able to golf, he is SOB walking to the bathroom. He noted weight change from 245 pound to 266 pound over the past 2-3 weeks. He noted inadequate improvement of SOB, leg edema, orthopnea with increasing torsemide to '40mg'$  daily by his pulmonologist 1.5 weeks ago. He did lose 5 pounds. He states he is feeling improved after receiving IV lasix at ED, has lost 11 pounds so far and urinate a lot. He denied any chest pain, dizziness, syncope, and states he feels great since leadless PPM placement and LUE swelling much improved. He does consume much salt, drinks lot of water daily. He did not take Entresto due to severe allergy.   Diagnostic here showed Cr 2.04 BUN 45 at admission. BNP 992. ProBNP from pulmonology office on 7/18 was 1211. CBC with Hgb 12.5, PLT 133k. D dimer WNL. CXR showed no significant interval change in the cardiomegaly,pulmonary vascular congestion, interstitial changes and likely trace bilateral pleural effusion. CXR from 7/18 at pulmonology office showed mild interstitial edema and trace pleural effusions.He was admitted to hospital medicine, received IV Lasix '60mg'$  x1 at ED and started on IV Lasix '40mg'$  BID so far, Cr improving to 1.78 today. Cardiology is consulted for further input today.      Past Medical History:  Diagnosis Date   Anemia    Arthritis    hips   Axillary adenopathy 02/25/2017   Bradycardia    a. holter monitor has demonstrated HRs in 30s and [place]     CAD (coronary artery disease)    a. s/p CABG 2001  b.  07/28/2017 cath:   Severe three-vessel native CAD with total occlusion of LAD, ramus intermedius, first OM and RCA, patent RIMA to PDA, LIMA to LAD, sequential SVG to ramus intermedius and first OM.     Chronic lower back pain    Diffuse large B cell lymphoma (HCC)    Diverticulosis    Esophageal stricture    GERD (gastroesophageal reflux disease)    History of gout    HTN (hypertension)    Mixed hyperlipidemia    OSA on CPAP    with 2L O2 at night   Pancytopenia ([place])    a. related to chemo therapy for B cell lymphoma   Peptic stricture of esophagus    Presence of permanent cardiac pacemaker    sees Dr. Valaria Good pacemaker   SCC (squamous cell carcinoma) 01/31/2021   R zygoma, EDC   SCC (squamous cell carcinoma) 01/31/2021   L post ankle, EDC   SCC (squamous cell carcinoma) 01/31/2021   L popliteal, EDC   Severe aortic stenosis    Spinal stenosis    Squamous cell  carcinoma of skin 03/22/2009   Right mandible. SCCis, hypertrophic.    Squamous cell carcinoma of skin 12/25/2021   R lateral cheek, EDC   Squamous cell carcinoma of skin 12/25/2021   R postauricular neck, EDC   Squamous cell carcinoma of skin 12/25/2021   R forearm sup, EDC   Squamous cell carcinoma of skin 12/25/2021   R wrist, EDC   Type II diabetes mellitus ([place])    Wears dentures    partial upper    Past Surgical History:  Procedure Laterality Date   APPENDECTOMY  ~ [place] Left 11/06/2016   Procedure: CATARACT EXTRACTION PHACO AND INTRAOCULAR LENS PLACEMENT ([place]);  Surgeon: Estill Cotta, MD;  Location: [facility] ORS;  Service: Ophthalmology;  Laterality: Left;  Lot # [id number] H [place]: 01:09.4 AP%:25.2 CDE: 30.64   CATARACT EXTRACTION W/PHACO Right 12/04/2016   Procedure: CATARACT EXTRACTION PHACO AND INTRAOCULAR LENS PLACEMENT ([facility]);  Surgeon: Estill Cotta, MD;  Location: [facility];  Service:  Ophthalmology;  Laterality: Right;  [place] 1:25.9 AP% 24.1 CDE 39.10 Fluid Pack lot # [id number]   COLONOSCOPY W/ BIOPSIES AND POLYPECTOMY  07/02/2011   CORONARY ANGIOPLASTY  07/02/1991   CORONARY ANGIOPLASTY WITH STENT PLACEMENT  05/31/1997   "1"   CORONARY ARTERY BYPASS GRAFT  03/01/2000   "CABG X5"   ECTROPION REPAIR Right 09/01/2018   Procedure: REPAIR OF ECTROPION BILATERAL upper and lower;  Surgeon: Karle Starch, MD;  Location: [place];  Service: Ophthalmology;  Laterality: Right;  Diabetic - oral meds sleep apnea   ESOPHAGEAL DILATION  X 3-4   Dr. Lyla Son; "last one was in the 1990's"   ESOPHAGOGASTRODUODENOSCOPY     multiple   FLEXIBLE SIGMOIDOSCOPY     multiple   HYDRADENITIS EXCISION Left 02/25/2017   Procedure: EXCISION DEEP LEFT AXILLARY LYMPH NODE;  Surgeon: Fanny Skates, MD;  Location: [place];  Service: General;  Laterality: Left;   INTRAOPERATIVE TRANSTHORACIC ECHOCARDIOGRAM N/A 09/09/2017   Procedure: INTRAOPERATIVE TRANSTHORACIC ECHOCARDIOGRAM;  Surgeon: Burnell Blanks, MD;  Location: [place];  Service: Open Heart Surgery;  Laterality: N/A;   KNEE ARTHROSCOPY Left 07/01/2009   meniscus repair   LEFT HEART CATHETERIZATION WITH CORONARY/GRAFT ANGIOGRAM N/A 03/16/2014   Procedure: LEFT HEART CATHETERIZATION WITH Beatrix Fetters;  Surgeon: Burnell Blanks, MD;  Location: [facility] CATH LAB;  Service: Cardiovascular;  Laterality: N/A;   LUMBAR LAMINECTOMY/DECOMPRESSION MICRODISCECTOMY Right 06/17/2013   Procedure: LUMBAR LAMINECTOMY MICRODISCECTOMY L4-L5 RIGHT EXCISION OF SYNOVIAL CYST RIGHT   (1 LEVEL) RIGHT PARTIAL FACETECTOMY;  Surgeon: Tobi Bastos, MD;  Location: [facility];  Service: Orthopedics;  Laterality: Right;   MYELOGRAM  04/06/2013   lumbar, Dr Gladstone Lighter   ORBITAL LESION EXCISION Right 09/01/2018   Procedure: ORBITOTOMY WITHOUT BONE FLAP WITH REMOVAL OF LESION RIGHT;  Surgeon: Karle Starch, MD;  Location: [place];   Service: Ophthalmology;  Laterality: Right;   PACEMAKER IMPLANT N/A 09/10/2017   Procedure: PACEMAKER IMPLANT;  Surgeon: Deboraha Sprang, MD;  Location: [place] CV LAB;  Service: Cardiovascular;  Laterality: N/A;   PANENDOSCOPY     PERIPHERAL VASCULAR BALLOON ANGIOPLASTY Left 03/16/2021   Procedure: PERIPHERAL VASCULAR BALLOON ANGIOPLASTY;  Surgeon: [name], MD;  Location: [place] CV LAB;  Service: Cardiovascular;  Laterality: Left;  subclavian  vein   PORTACATH PLACEMENT N/A 03/06/2017   Procedure: INSERTION PORT-A-CATH AND ASPIRATE SEROMA LEFT AXILLA;  Surgeon: Fanny Skates, MD;  Location: [place];  Service: General;  Laterality: N/A;   RIGHT/LEFT HEART CATH AND CORONARY/GRAFT ANGIOGRAPHY N/A 07/28/2017   Procedure: RIGHT/LEFT HEART CATH AND CORONARY/GRAFT ANGIOGRAPHY;  Surgeon: Sherren Mocha, MD;  Location: [place] CV LAB;  Service: Cardiovascular;  Laterality: N/A;   SHOULDER SURGERY Right 08/30/2010   screws placed; "tendons tore off"   SKIN CANCER EXCISION Right    "neck"   TONSILLECTOMY  ~ [place] Left 06/29/2019   Procedure: TOTAL KNEE ARTHROPLASTY;  Surgeon: Paralee Cancel, MD;  Location[facility];  Service: Orthopedics;  Laterality: Left;  70 mins   TRANSCATHETER AORTIC VALVE REPLACEMENT, TRANSFEMORAL N/A 09/09/2017   Procedure: TRANSCATHETER AORTIC VALVE REPLACEMENT, TRANSFEMORAL;  Surgeon: Burnell Blanks, MD;  Location: [place];  Service: Open Heart Surgery;  Laterality: N/A;   UPPER EXTREMITY VENOGRAPHY Left 02/15/2021   Procedure: CENTRAL VENO;  Surgeon: [name], MD;  Location: [place] CV LAB;  Service: Cardiovascular;  Laterality: Left;   UPPER EXTREMITY VENOGRAPHY N/A 03/16/2021   Procedure: UPPER EXTREMITY VENOGRAPHY;  Surgeon: [name], MD;  Location: [place] CV LAB;  Service: Cardiovascular;  Laterality: N/A;   UPPER GI ENDOSCOPY  07/02/2011   Gastritis; Dr Carlean Purl   VASECTOMY     wireless  pacemaker placed       Home Medications:  Prior to Admission medications   Medication Sig Start Date End Date Taking? Authorizing Provider  acetaminophen (TYLENOL) 325 MG tablet Take 650 mg by mouth at bedtime as needed for moderate pain or headache.   Yes [provider]  allopurinol (ZYLOPRIM) 300 MG tablet Take 450 mg by mouth daily.   Yes [provider]  aluminum hydroxide-magnesium carbonate (GAVISCON) 95-358 MG/15ML SUSP Take 15 mLs by mouth as needed for indigestion or heartburn.   Yes [provider]  amLODipine (NORVASC) 5 MG tablet Take 5 mg by mouth in the morning and at bedtime. 12/26/21  Yes Minus Breeding, MD  apixaban (ELIQUIS) 2.5 MG TABS tablet Take 1 tablet (2.5 mg total) by mouth 2 (two) times daily. 08/17/21  Yes Minus Breeding, MD  Ascorbic Acid (VITAMIN C PO) Take 1 tablet by mouth daily with breakfast.   Yes [provider]  atorvastatin (LIPITOR) 80 MG tablet TAKE 1 TABLET BY MOUTH EVERYDAY AT BEDTIME Patient taking differently: Take 80 mg by mouth at bedtime. 12/21/21  Yes Minus Breeding, MD  azelastine (ASTELIN) 0.1 % nasal spray PLACE 2 SPRAYS INTO BOTH NOSTRILS AT BEDTIME AS NEEDED FOR RHINITIS OR ALLERGIES. 08/03/21  Yes [name], DO  Coenzyme Q10 (CO Q-10 PO) Take 1 capsule by mouth daily.   Yes [provider]  esomeprazole (NEXIUM) 40 MG capsule Take 1 capsule (40 mg total) by mouth daily. Patient taking differently: Take 40 mg by mouth daily before breakfast. 10/15/12  Yes [name], MD  ezetimibe (ZETIA) 10 MG tablet TAKE 1 TABLET BY MOUTH EVERY DAY Patient taking differently: Take 10 mg by mouth daily. 12/27/21  Yes Hochrein, Jeneen Rinks, MD  FARXIGA 10 MG TABS tablet Take 10 mg by mouth in the morning.   Yes [provider]  fenofibrate 160 MG tablet TAKE 1 TABLET BY MOUTH EVERY DAY Patient taking differently: Take 160 mg by mouth daily. 11/12/21  Yes [name], DO  folic acid  (FOLVITE) 1 MG tablet TAKE 2 TABLETS BY MOUTH EVERY DAY Patient taking differently: Take 2 mg by mouth in the morning. 06/21/21  Yes [name],  MD  glimepiride (AMARYL) 1 MG tablet TAKE 1 TABLET BY MOUTH EVERY DAY WITH BREAKFAST Patient taking differently: Take 1 mg by mouth daily with breakfast. 10/02/21  Yes Lowne Chase, Alferd Apa, DO  icosapent Ethyl (VASCEPA) 1 g capsule TAKE 2 CAPSULES BY MOUTH TWICE A DAY Patient taking differently: Take 2 g by mouth in the morning and at bedtime. 11/05/21  Yes Lowne Chase, Yvonne R, DO  KLOR-CON M20 20 MEQ tablet TAKE 2 TABLETS (40 MEQ TOTAL) BY MOUTH 2 (TWO) TIMES DAILY. Patient taking differently: Take 40 mEq by mouth in the morning and at bedtime. 04/02/21  Yes Hochrein, Jeneen Rinks, MD  Menthol-Camphor (ICY HOT ADVANCED PAIN RELIEF EX) Apply 1 application  topically daily as needed (to painful sites).   Yes [provider]  metFORMIN (GLUCOPHAGE) 500 MG tablet Take 1 tablet (500 mg total) by mouth 2 (two) times daily with a meal. 01/08/22  Yes Roma Schanz R, DO  metoprolol tartrate (LOPRESSOR) 25 MG tablet TAKE 1/2 TABLET BY MOUTH TWICE DAILY Patient taking differently: Take 12.5 mg by mouth in the morning and at bedtime. 12/21/21  Yes Hochrein, Jeneen Rinks, MD  nitroGLYCERIN (NITROSTAT) 0.4 MG SL tablet PLACE 1 TABLET (0.4 MG TOTAL) UNDER THE TONGUE EVERY 5 (FIVE) MINUTES AS NEEDED FOR CHEST PAIN. 06/27/21  Yes Minus Breeding, MD  pregabalin (LYRICA) 150 MG capsule Take 1 capsule (150 mg total) by mouth 2 (two) times daily. Patient taking differently: Take 150 mg by mouth in the morning and at bedtime. 08/16/21  Yes Roma Schanz R, DO  psyllium (METAMUCIL) 58.6 % powder Take 1 packet by mouth daily as needed (for constipation- mix and drink).   Yes [provider]  torsemide (DEMADEX) 20 MG tablet Take 40 mg by mouth in the morning. 04/27/20  Yes [provider]  FREESTYLE LITE test strip USE TO TEST BLOOD SUGAR ONCE A DAY.  DX CODE: E11.9 06/21/21   Carollee Herter, Alferd Apa, DO  Lancets (FREESTYLE) lancets USE ONCE A DAY TO CHECK BLOOD SUGAR. 10/09/21   [name], DO    Inpatient Medications: Scheduled Meds:  allopurinol  450 mg Oral Daily   apixaban  2.5 mg Oral BID   atorvastatin  80 mg Oral QHS   ezetimibe  10 mg Oral Daily   fenofibrate  160 mg Oral Daily   folic acid  2 mg Oral Daily   furosemide  60 mg Intravenous Q12H   icosapent Ethyl  2 g Oral BID   metoprolol succinate  25 mg Oral Daily   potassium chloride SA  40 mEq Oral BID   pregabalin  150 mg Oral BID   sodium chloride flush  3 mL Intravenous Q12H   Continuous Infusions:  sodium chloride     PRN Meds: sodium chloride, acetaminophen, ondansetron (ZOFRAN) IV, sodium chloride flush  Allergies:    Allergies  Allergen Reactions   Ace Inhibitors Swelling and Other (See Comments)    Angioedema    Benazepril Swelling and Other (See Comments)    Angioedema; he is not a candidate for any angiotensin receptor blockers because of this significant allergic reaction. Because of a history of documented adverse serious drug reaction;Medi Alert bracelet  is recommended   Entresto [Sacubitril-Valsartan] Swelling and Other (See Comments)    First-in-Class Angiotensin Receptor Neprilysin Inhibitor- Med was "red-flagged" by the patient's pharmacy for him to NOT take!   Hctz [Hydrochlorothiazide] Anaphylaxis and Swelling    Tongue and lip swelling  Aspirin Other (See Comments)    Gastritis, can aspirin not take 325 mg aspirin    Social History:   Social History   Socioeconomic History   Marital status: Divorced    Spouse name: Not on file   Number of children: 2   Years of education: college   Highest education level: Not on file  Occupational History    Employer: RETIRED  Tobacco Use   Smoking status: Never   Smokeless tobacco: Never  Vaping Use   Vaping Use: Never used  Substance and Sexual Activity   Alcohol use: No     Alcohol/week: 0.0 standard drinks of alcohol    Comment: "last drink was in 2012"( 03/15/2014)   Drug use: No   Sexual activity: Not Currently  Other Topics Concern   Not on file  Social History Narrative   ** Merged History Encounter **       Divorced, lives with a roommate. 1 son one daughter 3-4 caffeinated beverages daily Right-handed. 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   Social Determinants of Health   Financial Resource Strain: Low Risk  (06/19/2021)   Overall Financial Resource Strain (CARDIA)    Difficulty of Paying Living Expenses: Not very hard  Food Insecurity: No Food Insecurity (09/06/2021)   Hunger Vital Sign    Worried About Running Out of Food in the Last Year: Never true    Ran Out of Food in the Last Year: Never true  Transportation Needs: No Transportation Needs (09/06/2021)   PRAPARE - Hydrologist (Medical): No    Lack of Transportation (Non-Medical): No  Physical Activity: Sufficiently Active (06/19/2021)   Exercise Vital Sign    Days of Exercise per Week: 5 days    Minutes of Exercise per Session: 30 min  Stress: Not on file  Social Connections: Moderately Isolated (09/06/2021)   Social Connection and Isolation Panel [NHANES]    Frequency of Communication with Friends and Family: More than three times a week    Frequency of Social Gatherings with Friends and Family: More than three times a week    Attends Religious Services: More than 4 times per year    Active Member of [employer] or Organizations: No    Attends Archivist Meetings: Never    Marital Status: Divorced  Human resources officer Violence: Not At Risk (09/06/2021)   Humiliation, Afraid, Rape, and Kick questionnaire    Fear of Current or Ex-Partner: No    Emotionally Abused: No    Physically Abused: No    Sexually Abused: No    Family  History:    Family History  Problem Relation Age of Onset   Stroke Father    Hypertension Father    Pancreatic cancer Mother    Diabetes Maternal Grandmother    Stroke Maternal Grandmother    Heart attack Paternal Grandmother    Colon cancer Neg Hx    Esophageal cancer Neg Hx    Rectal cancer Neg Hx    Stomach cancer Neg Hx    Ulcers Neg Hx      ROS:  Constitutional: Denied fever, chills, malaise, night sweats Eyes: Denied vision change or loss Ears/Nose/Mouth/Throat: Denied ear ache, sore throat, sinus pain Cardiovascular: see HPI  Respiratory: see HPI Gastrointestinal: Denied nausea, vomiting, abdominal pain, diarrhea Genital/Urinary: Denied dysuria, hematuria, urinary frequency/urgency Musculoskeletal: Denied  muscle ache, joint pain, weakness Skin: Denied rash, wound Neuro: Denied headache, dizziness, syncope Psych: Denied history of depression/anxiety  Endocrine: history of diabetes     Physical Exam/Data:   Vitals:   01/17/22 0400 01/17/22 0438 01/17/22 0500 01/17/22 0600  BP:      Pulse:      Resp: '18 19 15 17  '$ Temp:      TempSrc:      SpO2:      Weight:  113 kg     No intake or output data in the 24 hours ending 01/17/22 1033    01/17/2022    4:38 AM 01/15/2022   10:17 AM 01/08/2022   10:19 AM  Last 3 Weights  Weight (lbs) 249 lb 1.9 oz 261 lb 266 lb 3.2 oz  Weight (kg) 113 kg 118.389 kg 120.748 kg     Body mass index is 33.79 kg/m.   Vitals:  Vitals:   01/17/22 0500 01/17/22 0600  BP:    Pulse:    Resp: 15 17  Temp:    SpO2:     General Appearance: In no apparent distress, sitting in bed HEENT: Normocephalic, atraumatic.  Neck: Supple, trachea midline, JVD up to mandible Cardiovascular: Regular rate and rhythm, normal S1-S2,  no murmur Respiratory: Resting breathing unlabored, lungs sounds clear but diminished at base to auscultation bilaterally, no use of accessory muscles. On room air.   Gastrointestinal: Bowel sounds positive, abdomen  soft.  Extremities: Able to move all extremities in bed without difficulty, 1+ pitting edema BLE Musculoskeletal: Normal muscle bulk and tone Skin: Intact, warm, dry. Scattered petechiae noted in arms Neurologic: Alert, oriented to person, place and time. Fluent speech, no facial droop, no cognitive deficit Psychiatric: Normal affect. Mood is appropriate.    EKG:  The EKG was personally reviewed and demonstrates:  V paced rhythm   Telemetry:  Telemetry was personally reviewed and demonstrates:  V paced rhythm   Relevant CV Studies:  Stress Myvoiew 12/18/21:   Findings are consistent with no prior ischemia and no prior myocardial infarction. The study is high risk based on reduction of her LV EF   No ST deviation was noted.   LV perfusion is normal. There is no evidence of ischemia. There is no evidence of infarction.   Left ventricular function is abnormal. Nuclear stress EF: 32 %. The left ventricular ejection fraction is moderately decreased (30-44%). End diastolic cavity size is severely enlarged.   Prior study not available for comparison.  Echo from 12/18/21:   1. Akinetic anterior/ anteroseptal wall from base to apex. Left  ventricular ejection fraction, by estimation, is 30 to 35%. The left  ventricle has moderately decreased function. The left ventricular internal  cavity size was mildly dilated. Left  ventricular diastolic parameters are indeterminate.   2. Right ventricular systolic function is mildly reduced. The right  ventricular size is moderately enlarged. There is mildly elevated  pulmonary artery systolic pressure.   3. Left atrial size was mildly dilated.   4. Right atrial size was mildly dilated.   5. Restricted posterior leaflet . The mitral valve is degenerative. Mild  to moderate mitral valve regurgitation. Moderate mitral annular  calcification.   6. No significant paravalvular leak. Prosthesis is well seated. Peak  velocity 1.98 m/s. DI 0.32. Normal  functioning prosthesis. The aortic  valve has been repaired/replaced. Aortic valve regurgitation is not  visualized. Procedure Date: 09/09/2017.   7. Aortic dilatation noted and mild dilation of the ascending aorta  and  root.   8. The inferior vena cava is dilated in size with >50% respiratory  variability, suggesting right atrial pressure of 8 mmHg.   Conclusion(s)/Recommendation(s): Compared to prior echo 12/23/2019, EF has  declined with LAD territory WMA.   Laboratory Data:  High Sensitivity Troponin:  No results for input(s): "TROPONINIHS" in the last 720 hours.   Chemistry Recent Labs  Lab 01/15/22 1122 01/16/22 1202 01/17/22 0514  NA 141 142 143  K 4.3 4.3 4.1  CL 105 108 108  CO2 '25 24 24  '$ GLUCOSE 151* 191* 96  BUN 48* 45* 43*  CREATININE 2.03* 2.04* 1.78*  CALCIUM 10.0 9.9 9.8  GFRNONAA  --  32* 37*  ANIONGAP  --  10 11    Recent Labs  Lab 01/16/22 1202  PROT 7.4  ALBUMIN 4.3  AST 18  ALT 17  ALKPHOS 77  BILITOT 1.1   Lipids No results for input(s): "CHOL", "TRIG", "HDL", "LABVLDL", "LDLCALC", "CHOLHDL" in the last 168 hours.  Hematology Recent Labs  Lab 01/16/22 1202 01/17/22 0514  WBC 7.5 7.0  RBC 4.31 4.21*  HGB 12.5* 11.9*  HCT 39.9 39.0  MCV 92.6 92.6  MCH 29.0 28.3  MCHC 31.3 30.5  RDW 18.9* 18.7*  PLT 133* 131*   Thyroid No results for input(s): "TSH", "FREET4" in the last 168 hours.  BNP Recent Labs  Lab 01/15/22 1122 01/16/22 1202 01/17/22 0514  BNP  --  992.0* 969.2*  PROBNP 1,211.0*  --   --     DDimer  Recent Labs  Lab 01/16/22 1920  DDIMER 0.49     Radiology/Studies:  VAS [place] LOWER EXTREMITY VENOUS (DVT)  Result Date: 01/16/2022  Lower Venous DVT Study Patient Name:  Richard [name]  Date of Exam:   01/16/2022 Medical Rec #: [id number]       Accession #:    [id number] Date of Birth: 1940/12/11       Patient Gender: M Patient Age:   53 years Exam Location:  [facility] Procedure:      VAS [place] LOWER EXTREMITY VENOUS  (DVT) Referring Phys: Karie Kirks --------------------------------------------------------------------------------  Indications: Swelling.  Risk Factors: None identified. Limitations: Poor ultrasound/tissue interface. Comparison Study: No prior studies. Performing Technologist: Oliver Hum RVT  Examination Guidelines: A complete evaluation includes B-mode imaging, spectral Doppler, color Doppler, and power Doppler as needed of all accessible portions of each vessel. Bilateral testing is considered an integral part of a complete examination. Limited examinations for reoccurring indications may be performed as noted. The reflux portion of the exam is performed with the patient in reverse Trendelenburg.  +---------+---------------+---------+-----------+----------+--------------+ RIGHT    CompressibilityPhasicitySpontaneityPropertiesThrombus Aging +---------+---------------+---------+-----------+----------+--------------+ CFV      Full           Yes      Yes                                 +---------+---------------+---------+-----------+----------+--------------+ SFJ      Full                                                        +---------+---------------+---------+-----------+----------+--------------+ FV Prox  Full                                                        +---------+---------------+---------+-----------+----------+--------------+  FV Mid   Full                                                        +---------+---------------+---------+-----------+----------+--------------+ FV DistalFull                                                        +---------+---------------+---------+-----------+----------+--------------+ PFV      Full                                                        +---------+---------------+---------+-----------+----------+--------------+ POP      Full           Yes      Yes                                  +---------+---------------+---------+-----------+----------+--------------+ PTV      Full                                                        +---------+---------------+---------+-----------+----------+--------------+ PERO     Full                                                        +---------+---------------+---------+-----------+----------+--------------+   +----+---------------+---------+-----------+----------+--------------+ LEFTCompressibilityPhasicitySpontaneityPropertiesThrombus Aging +----+---------------+---------+-----------+----------+--------------+ CFV Full           Yes      Yes                                 +----+---------------+---------+-----------+----------+--------------+    Summary: RIGHT: - There is no evidence of deep vein thrombosis in the lower extremity.  - No cystic structure found in the popliteal fossa.  LEFT: - No evidence of common femoral vein obstruction.  *See table(s) above for measurements and observations. Electronically signed by Monica Martinez MD on 01/16/2022 at 7:10:07 PM.    Final    DG Chest 2 View  Result Date: 01/16/2022 CLINICAL DATA:  sob EXAM: CHEST - 2 VIEW COMPARISON:  None Available. FINDINGS: Mild enlargement of the cardio mediastinal silhouette, stable. Sternotomy wires, aortic valve replacement changes and aortic arch atherosclerotic calcifications, stable. Again seen are the mild pulmonary vascular congestion and interstitial changes under pulmonary more prominent at the lung bases, stable. No focal consolidation. Trace bilateral pleural effusion. The visualized skeletal structures are unremarkable. IMPRESSION: There has been no significant interval change in the cardiomegaly, pulmonary vascular congestion, interstitial changes and likely trace bilateral pleural effusion. Electronically Signed   By: Frazier Richards M.D.   On: 01/16/2022 11:55  DG Chest 2 View  Result Date: 01/15/2022 CLINICAL DATA:  Pulmonary edema.  EXAM: CHEST - 2 VIEW COMPARISON:  Chest radiographs 12/26/2021 FINDINGS: Sequelae of transcatheter aortic valve replacement are again identified. The cardiac silhouette remains mildly enlarged. Aortic atherosclerosis and a leadless pacemaker are again noted. Pulmonary vascular congestion and mild diffuse interstitial prominence are similar to the prior study, and there are persistent trace bilateral pleural effusions. No pneumothorax is identified. No acute osseous abnormality is seen. IMPRESSION: Unchanged findings of mild interstitial edema and trace pleural effusions. Electronically Signed   By: Logan Bores M.D.   On: 01/15/2022 11:33     Assessment and Plan:   Acute systolic and diastolic heart failure  - presented with SOB, leg edema, orthopnea progressively worsening for 1 yr, despite torsemide '40mg'$  daily  - self reports weight gain 266 from 245 over 2-3 weeks - BNP 992, POA - CXR with pulmonary congestion, POA  - Echo from 12/18/21 with LVEF 30-35% (was 60-65%11/2019); Stress Myoview 12/18/21 high risk but no ischemia; etiology unclear, possible leadless PPM related where Dr Percival Spanish had reached out to Dr Caryl Comes  - Cr 2.04 POA, now 1.78 after diuresis, clinically volume status improving, suspect CRS - will increase IV Lasix to '60mg'$  BID, monitor daily labs/weight/I&O - GDMT: will switch Metoprolol 12.'5mg'$  BID to Toprol XL '25mg'$  daily; no ARNI/ACEI/ARB due to severe angioedema, unlikely add MRA and SGLT2I given CKD III-IV; if BP high, may try Bidil   CAD with hx of remote CABG 2001 - SOB is from CHF, not angina equivalent, recent stress myoview 12/18/21 high risk but no ischemia;  Hx of aortic stenosis - s/p TAVR 2019, functional prosthesis without PVL on Echo 12/18/21   CHB following TAVR 2019 - s/p conversion to [employer] Micra AV pacemaker 08/2021 from [facility] due to 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 The patient's score is based upon: CHF History: 1 HTN History: 1 Diabetes History: 1 Stroke History: 0 Vascular Disease History: 1 Age Score: 2 Gender Score: 0  For questions or updates, please contact [facility] Please consult [email or web address] for contact info under  Signed, Margie Billet, NP  01/17/2022 10:33 AM  As above, patient seen and examined.  Briefly he is an 81 year old male with past medical history of chronic combined systolic/diastolic congestive heart failure, ischemic cardiomyopathy, coronary artery disease status post coronary bypass and graft, history of TAVR, prior pacemaker, chronic stage III kidney disease, permanent atrial fibrillation, history of angioedema with ACE inhibitor, diabetes mellitus, history of B-cell lymphoma for evaluation of acute on chronic combined systolic/diastolic congestive heart failure.  Most recent echocardiogram June 2023 showed ejection fraction 30 to 35%, moderate right ventricular enlargement with mild RV dysfunction, mild biatrial enlargement, mild to moderate mitral regurgitation, prior aortic valve replacement with no aortic insufficiency. Patient states that for the past several months he has had progressive dyspnea  on exertion.  He has also noticed increasing bilateral lower extremity edema.  He denies chest pain.  He did have COVID and a strep throat in the past several months and has hemoptysis/productive cough.  He was  admitted and cardiology asked to evaluate.   Chest x-ray on admission showed mild interstitial edema and small effusions.  VQ scan not suggestive of pulmonary embolus.  Creatinine 1.78 with BUN 43, BNP 969, hemoglobin 11.9, platelet count 131, lower extremity venous Doppler showed no DVT.  Electrocardiogram shows ventricular pacing.  1 acute on chronic combined systolic/diastolic congestive heart failure-continue Lasix at present dose.  Will not add spironolactone in the setting of renal insufficiency.  Add Farxiga 10 mg daily.  Follow renal function.  He is overall improving.  2 ischemic cardiomyopathy-CHF is improving and we will therefore continue low-dose beta-blocker.  Continue Toprol 25 mg daily.  He has an allergy to ACE inhibitors/ARB's/ARNI including angioedema.  We will consider adding hydralazine/nitrates later.  3 permanent atrial fibrillation-continue Toprol at present dose for rate control.  Continue apixaban.  4 coronary artery disease status post coronary artery bypass and graft-continue statin.  He is not on aspirin given need for apixaban.  5 hyperlipidemia-continue present medical regimen.  6 chronic stage IIIa kidney disease-follow renal function closely with diuresis.  7 status post aortic valve replacement-most recent echocardiogram showed normally functioning valve.  8 status post pacemaker  Kirk Ruths, MD

## 2022-01-17 NOTE — Plan of Care (Signed)
  Problem: Education: Goal: Knowledge of General Education information will improve Description: Including pain rating scale, medication(s)/side effects and non-pharmacologic comfort measures Outcome: Completed/Met   Problem: Activity: Goal: Risk for activity intolerance will decrease Outcome: Completed/Met   Problem: Pain Managment: Goal: General experience of comfort will improve Outcome: Completed/Met

## 2022-01-17 NOTE — Progress Notes (Signed)
I saw that [name] is in the hospital.  I know him well.  He is a very nice 81 year old white male.  We have been seeing him for several years.  He actually has been seeing [place] initially because of recurrent iron deficiency anemia.  He then presented with high-grade B cell non-Hodgkin's lymphoma.  This was in his bones.  He was treated with 8 cycles of R-CHOP.  He completed this back in I think September 2021  He has been coming to the office monthly for B12 injections and possible IV iron.  I he was just in the office on 12/28/2021.  At the time, his hemoglobin was 11.8 hematocrit was 38.1.  Platelet count 119,000.  His iron studies in late June showed a ferritin of 1300 with an iron saturation of 18%.  He subsequently has been admitted because of congestive heart failure.  He gained quite a bit of fluid weight.  Had an echocardiogram done done on 12/18/2021.  This showed a decreased ejection fraction of 30-35%.  The left ventricle had moderately decreased function.  The left atrium and right atrium were moderately dilated.  There is some mitral valve degeneration.  He has mild to moderate mitral valve regurgitation.  He does have a prosthetic aortic valve.  When he came in, his BUN was 45 creatinine 2.  Today, his BUN is 43 creatinine 1.78.  When he was admitted on the 19th, his white cell count was 7.5.  Hemoglobin 12.5.  Platelet count 133,000.  I think the issue here is whether or not this is congestive heart failure from intrinsic cardiac abnormalities.  He does have risk factors for coronary artery disease.  However, I do not worry about the fact that he did receive Adriamycin.  He had 8 cycles of Adriamycin.  I am not sure exactly what the total dose of Adriamycin was but this can be obtained from the pharmacy.  It is possible he may have cardiomyopathy from Adriamycin.  Typically, this shows up about 7-10 years later.  Again, given his age and other risk factors, he may have developed this  sooner.  I am not sure there is any way to know if there is Adriamycin cardiotoxicity.  I know that endomyocardial biopsies were not done anymore.  He has been diuresed.  He looks pretty good.  He still has quite a bit of swelling in his legs.  He did have Doppler of his right leg.  This was negative for any obvious thrombus.  I hate the fact that he is in the hospital.  He is always been active.  He likes to play golf.  He cannot take Entresto because he has an allergic reaction to ARB agents.  Again, no cardiology will be aggressive.  He still has had a very active lifestyle and it would be nice to get him back to this.  Again, I am not sure if Adriamycin that he received for the lymphoma could be a factor at this point.  I do see exactly what the dose of Adriamycin was that he received.  We will be more than happy to help out in any way possible.  Lattie Haw, MD  Romans 8:28

## 2022-01-18 DIAGNOSIS — I350 Nonrheumatic aortic (valve) stenosis: Secondary | ICD-10-CM | POA: Diagnosis not present

## 2022-01-18 DIAGNOSIS — D500 Iron deficiency anemia secondary to blood loss (chronic): Secondary | ICD-10-CM | POA: Diagnosis not present

## 2022-01-18 DIAGNOSIS — Z950 Presence of cardiac pacemaker: Secondary | ICD-10-CM | POA: Diagnosis not present

## 2022-01-18 DIAGNOSIS — I5023 Acute on chronic systolic (congestive) heart failure: Secondary | ICD-10-CM | POA: Diagnosis not present

## 2022-01-18 LAB — BASIC METABOLIC PANEL
Anion gap: 11 (ref 5–15)
BUN: 45 mg/dL — ABNORMAL HIGH (ref 8–23)
CO2: 26 mmol/L (ref 22–32)
Calcium: 9.9 mg/dL (ref 8.9–10.3)
Chloride: 104 mmol/L (ref 98–111)
Creatinine, Ser: 2.05 mg/dL — ABNORMAL HIGH (ref 0.61–1.24)
GFR, Estimated: 32 mL/min — ABNORMAL LOW (ref >60)
Glucose, Bld: 116 mg/dL — ABNORMAL HIGH (ref 70–99)
Potassium: 3.8 mmol/L (ref 3.5–5.1)
Sodium: 141 mmol/L (ref 135–145)

## 2022-01-18 MED ORDER — PSYLLIUM 95 % PO PACK
1.0000 | PACK | Freq: Two times a day (BID) | ORAL | Status: DC
  Administered 2022-01-18 – 2022-01-23 (×9): 1 via ORAL
  Filled 2022-01-18 (×11): qty 1

## 2022-01-18 MED ORDER — ALUM & MAG HYDROXIDE-SIMETH 200-200-20 MG/5ML PO SUSP
15.0000 mL | ORAL | Status: DC | PRN
  Administered 2022-01-18: 15 mL via ORAL
  Filled 2022-01-18: qty 30

## 2022-01-18 MED ORDER — ALUM & MAG HYDROXIDE-SIMETH 200-200-20 MG/5ML PO SUSP
15.0000 mL | Freq: Once | ORAL | Status: DC | PRN
Start: 2022-01-18 — End: 2022-01-23

## 2022-01-18 MED ORDER — MAGNESIUM HYDROXIDE 400 MG/5ML PO SUSP: 30.0000 mL | Freq: Every day | ORAL | Status: DC | PRN

## 2022-01-18 NOTE — Progress Notes (Signed)
Progress Note  Patient Name: Richard [name] Date of Encounter: 01/18/2022  [facility] Cardiologist: Minus Breeding, MD   Subjective   No CP or dyspnea  Inpatient Medications    Scheduled Meds:  allopurinol  450 mg Oral Daily   apixaban  2.5 mg Oral BID   atorvastatin  80 mg Oral QHS   dapagliflozin propanediol  10 mg Oral Daily   ezetimibe  10 mg Oral Daily   fenofibrate  160 mg Oral Daily   folic acid  2 mg Oral Daily   furosemide  60 mg Intravenous Q12H   icosapent Ethyl  2 g Oral BID   metoprolol succinate  25 mg Oral Daily   potassium chloride SA  40 mEq Oral BID   pregabalin  150 mg Oral BID   sodium chloride flush  3 mL Intravenous Q12H   Continuous Infusions:  sodium chloride     PRN Meds: sodium chloride, acetaminophen, ondansetron (ZOFRAN) IV, mouth rinse, sodium chloride flush   Vital Signs    Vitals:   01/17/22 1333 01/17/22 2031 01/18/22 0309 01/18/22 0539  BP: 101/60 129/72  131/75  Pulse: 77 77  70  Resp: '18 17  17  '$ Temp: 98.5 F (36.9 C) 97.7 F (36.5 C)  98 F (36.7 C)  TempSrc: Oral Oral  Oral  SpO2: 92% 92%  97%  Weight:   110 kg   Height:        Intake/Output Summary (Last 24 hours) at 01/18/2022 0853 Last data filed at 01/18/2022 0759 Gross per 24 hour  Intake 1143 ml  Output 3320 ml  Net -2177 ml      01/18/2022    3:09 AM 01/17/2022    4:38 AM 01/15/2022   10:17 AM  Last 3 Weights  Weight (lbs) 242 lb 6.4 oz 249 lb 1.9 oz 261 lb  Weight (kg) 109.952 kg 113 kg 118.389 kg      Telemetry    Atrial fibrillation rate controlled - Personally Reviewed  Physical Exam   GEN: No acute distress.   Neck: supple Cardiac: irregular Respiratory: Clear to auscultation bilaterally. GI: Soft, nontender, non-distended  MS: 1+ edema Neuro:  Nonfocal  Psych: Normal affect   Labs    High Sensitivity Troponin:   Recent Labs  Lab 01/17/22 1147 01/17/22 1400  TROPONINIHS 34* 30*     Chemistry Recent Labs  Lab 01/16/22 1202  01/17/22 0514 01/18/22 0422  NA 142 143 141  K 4.3 4.1 3.8  CL 108 108 104  CO2 '24 24 26  '$ GLUCOSE 191* 96 116*  BUN 45* 43* 45*  CREATININE 2.04* 1.78* 2.05*  CALCIUM 9.9 9.8 9.9  PROT 7.4  --   --   ALBUMIN 4.3  --   --   AST 18  --   --   ALT 17  --   --   ALKPHOS 77  --   --   BILITOT 1.1  --   --   GFRNONAA 32* 37* 32*  ANIONGAP '10 11 11     '$ Hematology Recent Labs  Lab 01/16/22 1202 01/17/22 0514  WBC 7.5 7.0  RBC 4.31 4.21*  HGB 12.5* 11.9*  HCT 39.9 39.0  MCV 92.6 92.6  MCH 29.0 28.3  MCHC 31.3 30.5  RDW 18.9* 18.7*  PLT 133* 131*    BNP Recent Labs  Lab 01/15/22 1122 01/16/22 1202 01/17/22 0514  BNP  --  992.0* 969.2*  PROBNP 1,211.0*  --   --  DDimer  Recent Labs  Lab 01/16/22 1920  DDIMER 0.49     Radiology    ECHOCARDIOGRAM COMPLETE  Result Date: 01/17/2022    ECHOCARDIOGRAM REPORT   Patient Name:   Richard [name] Date of Exam: 01/17/2022 Medical Rec #:  [id number]      Height:       72.0 in Accession #:    [id number]     Weight:       249.1 lb Date of Birth:  09/14/1940      BSA:          2.339 m Patient Age:    81 years       BP:           101/60 mmHg Patient Gender: M              HR:           81 bpm. Exam Location:  Inpatient Procedure: 2D Echo, Cardiac Doppler and Color Doppler Indications:    CHF  History:        Patient has prior history of Echocardiogram examinations, most                 recent 12/18/2021. CHF, CAD; Risk Factors:Hypertension, Sleep                 Apnea and Diabetes.                 Aortic Valve: 26 mm Sapien prosthetic, stented (TAVR) valve is                 present in the aortic position.  Sonographer:    Jefferey Pica Referring Phys: [id number] AVA SWAYZE IMPRESSIONS  1. Left ventricular ejection fraction, by estimation, is 30 to 35%. The left ventricle has moderately decreased function. The left ventricle demonstrates regional wall motion abnormalities (see scoring diagram/findings for description). The left ventricular  internal cavity size was moderately dilated. Left ventricular diastolic parameters are consistent with Grade II diastolic dysfunction (pseudonormalization). Elevated left ventricular end-diastolic pressure.  2. Right ventricular systolic function is normal. The right ventricular size is normal. There is mildly elevated pulmonary artery systolic pressure.  3. Left atrial size was mildly dilated.  4. Right atrial size was severely dilated.  5. The mitral valve is normal in structure. Trivial mitral valve regurgitation. No evidence of mitral stenosis. Moderate mitral annular calcification.  6. Tricuspid valve regurgitation is mild to moderate.  7. The aortic valve has been repaired/replaced. Aortic valve regurgitation is not visualized. No aortic stenosis is present. There is a 26 mm Sapien prosthetic (TAVR) valve present in the aortic position.  8. Aortic dilatation noted. There is borderline dilatation of the ascending aorta, measuring 36 mm.  9. The inferior vena cava is dilated in size with <50% respiratory variability, suggesting right atrial pressure of 15 mmHg. FINDINGS  Left Ventricle: Left ventricular ejection fraction, by estimation, is 30 to 35%. The left ventricle has moderately decreased function. The left ventricle demonstrates regional wall motion abnormalities. The left ventricular internal cavity size was moderately dilated. There is no left ventricular hypertrophy. Left ventricular diastolic parameters are consistent with Grade II diastolic dysfunction (pseudonormalization). Elevated left ventricular end-diastolic pressure.  LV Wall Scoring: The anterior septum and apex are akinetic. The entire anterior wall, entire lateral wall, inferior septum, and entire inferior wall are hypokinetic. Right Ventricle: The right ventricular size is normal. No increase in right ventricular wall thickness. Right ventricular systolic function is normal.  There is mildly elevated pulmonary artery systolic pressure. The  tricuspid regurgitant velocity is 2.57  m/s, and with an assumed right atrial pressure of 15 mmHg, the estimated right ventricular systolic pressure is 53.9 mmHg. Left Atrium: Left atrial size was mildly dilated. Right Atrium: Right atrial size was severely dilated. Pericardium: There is no evidence of pericardial effusion. Mitral Valve: The mitral valve is normal in structure. Moderate mitral annular calcification. Trivial mitral valve regurgitation. No evidence of mitral valve stenosis. Tricuspid Valve: The tricuspid valve is normal in structure. Tricuspid valve regurgitation is mild to moderate. No evidence of tricuspid stenosis. Aortic Valve: The aortic valve has been repaired/replaced. Aortic valve regurgitation is not visualized. No aortic stenosis is present. Aortic valve peak gradient measures 14.4 mmHg. There is a 26 mm Sapien prosthetic, stented (TAVR) valve present in the  aortic position. Pulmonic Valve: The pulmonic valve was normal in structure. Pulmonic valve regurgitation is trivial. No evidence of pulmonic stenosis. Aorta: Aortic dilatation noted. There is borderline dilatation of the ascending aorta, measuring 36 mm. Venous: The inferior vena cava is dilated in size with less than 50% respiratory variability, suggesting right atrial pressure of 15 mmHg. IAS/Shunts: No atrial level shunt detected by color flow Doppler.  LEFT VENTRICLE PLAX 2D LVIDd:         6.10 cm   Diastology LVIDs:         5.10 cm   LV e' lateral:   7.43 cm/s LV PW:         1.20 cm   LV E/e' lateral: 16.4 LV IVS:        1.00 cm LVOT diam:     2.10 cm LV SV:         96 LV SV Index:   41 LVOT Area:     3.46 cm  RIGHT VENTRICLE            IVC RV Basal diam:  4.10 cm    IVC diam: 3.20 cm RV Mid diam:    4.50 cm RV S prime:     6.45 cm/s TAPSE (M-mode): 1.0 cm LEFT ATRIUM             Index        RIGHT ATRIUM           Index LA diam:        5.60 cm 2.39 cm/m   RA Area:     34.10 cm LA Vol (A2C):   72.9 ml 31.17 ml/m  RA Volume:    130.00 ml 55.58 ml/m LA Vol (A4C):   76.4 ml 32.66 ml/m LA Biplane Vol: 75.6 ml 32.32 ml/m  AORTIC VALVE                 PULMONIC VALVE AV Area (Vmax): 2.72 cm     PV Vmax:       0.83 m/s AV Vmax:        189.50 cm/s  PV Peak grad:  2.8 mmHg AV Peak Grad:   14.4 mmHg LVOT Vmax:      149.00 cm/s LVOT Vmean:     93.400 cm/s LVOT VTI:       0.278 m  AORTA Ao Root diam: 3.40 cm Ao Asc diam:  3.60 cm MITRAL VALVE                TRICUSPID VALVE MV Area (PHT): 3.07 cm     TR Peak grad:   26.4 mmHg MV Decel Time: 247 msec  TR Vmax:        257.00 cm/s MR Peak grad: 84.6 mmHg MR Vmax:      460.00 cm/s   SHUNTS MV E velocity: 122.00 cm/s  Systemic VTI:  0.28 m MV A velocity: 83.90 cm/s   Systemic Diam: 2.10 cm MV E/A ratio:  1.45 Skeet Latch MD Electronically signed by Skeet Latch MD Signature Date/Time: 01/17/2022/3:55:48 PM    Final    NM Pulmonary Perfusion  Result Date: 01/17/2022 CLINICAL DATA:  High probability pulmonary embolus. EXAM: NUCLEAR MEDICINE PERFUSION LUNG SCAN TECHNIQUE: Perfusion images were obtained in multiple projections after intravenous injection of radiopharmaceutical. Ventilation scans intentionally deferred if perfusion scan and chest x-ray adequate for interpretation during COVID 19 epidemic. RADIOPHARMACEUTICALS:  4.4 mCi Tc-24mMAA IV COMPARISON:  Chest radiograph January 16, 2022 FINDINGS: No large wedge-shaped segmental perfusion defects. Relative homogeneous radiotracer uptake in the bilateral lungs. IMPRESSION: No scintigraphic evidence of pulmonary embolus. Electronically Signed   By: JDahlia BailiffM.D.   On: 01/17/2022 11:17   VAS [place]OWER EXTREMITY VENOUS (DVT)  Result Date: 01/16/2022  Lower Venous DVT Study Patient Name:  Richard [name] Date of Exam:   01/16/2022 Medical Rec #: [id number]      Accession #:    [id number] of Birth: [birth year]      Patient Gender: M Patient Age:   89years [facility]      VAS [place]OWER EXTREMITY  VENOUS (DVT) Referring Phys: AKarie Kirks--------------------------------------------------------------------------------  Indications: Swelling.  Risk Factors: None identified. Limitations: Poor ultrasound/tissue interface. Comparison Study: No prior studies. Performing Technologist: GOliver HumRVT  Examination Guidelines: A complete evaluation includes B-mode imaging, spectral Doppler, color Doppler, and power Doppler as needed of all accessible portions of each vessel. Bilateral testing is considered an integral part of a complete examination. Limited examinations for reoccurring indications may be performed as noted. The reflux portion of the exam is performed with the patient in reverse Trendelenburg.  +---------+---------------+---------+-----------+----------+--------------+ RIGHT    CompressibilityPhasicitySpontaneityPropertiesThrombus Aging +---------+---------------+---------+-----------+----------+--------------+ CFV      Full           Yes      Yes                                 +---------+---------------+---------+-----------+----------+--------------+ SFJ      Full                                                        +---------+---------------+---------+-----------+----------+--------------+ FV Prox  Full                                                        +---------+---------------+---------+-----------+----------+--------------+ FV Mid   Full                                                        +---------+---------------+---------+-----------+----------+--------------+ FV DistalFull                                                        +---------+---------------+---------+-----------+----------+--------------+  PFV      Full                                                        +---------+---------------+---------+-----------+----------+--------------+ POP      Full           Yes      Yes                                  +---------+---------------+---------+-----------+----------+--------------+ PTV      Full                                                        +---------+---------------+---------+-----------+----------+--------------+ PERO     Full                                                        +---------+---------------+---------+-----------+----------+--------------+   +----+---------------+---------+-----------+----------+--------------+ LEFTCompressibilityPhasicitySpontaneityPropertiesThrombus Aging +----+---------------+---------+-----------+----------+--------------+ CFV Full           Yes      Yes                                 +----+---------------+---------+-----------+----------+--------------+    Summary: RIGHT: - There is no evidence of deep vein thrombosis in the lower extremity.  - No cystic structure found in the popliteal fossa.  LEFT: - No evidence of common femoral vein obstruction.  *See table(s) above for measurements and observations. Electronically signed by Monica Martinez MD on 01/16/2022 at 7:10:07 PM.    Final    DG Chest 2 View  Result Date: 01/16/2022 CLINICAL DATA:  sob EXAM: CHEST - 2 VIEW COMPARISON:  None Available. FINDINGS: Mild enlargement of the cardio mediastinal silhouette, stable. Sternotomy wires, aortic valve replacement changes and aortic arch atherosclerotic calcifications, stable. Again seen are the mild pulmonary vascular congestion and interstitial changes under pulmonary more prominent at the lung bases, stable. No focal consolidation. Trace bilateral pleural effusion. The visualized skeletal structures are unremarkable. IMPRESSION: There has been no significant interval change in the cardiomegaly, pulmonary vascular congestion, interstitial changes and likely trace bilateral pleural effusion. Electronically Signed   By: Frazier Richards M.D.   On: 01/16/2022 11:55      Patient Profile      81 year old male with past medical history of  chronic combined systolic/diastolic congestive heart failure, ischemic cardiomyopathy, coronary artery disease status post coronary bypass and graft, history of TAVR, prior pacemaker, chronic stage III kidney disease, permanent atrial fibrillation, history of angioedema with ACE inhibitor, diabetes mellitus, history of B-cell lymphoma for evaluation of acute on chronic combined systolic/diastolic congestive heart failure.  Most recent echocardiogram shows ejection fraction 30 to 35%, moderate left ventricular enlargement, grade 2 diastolic dysfunction, biatrial enlargement, mild to moderate tricuspid regurgitation, status post aortic valve replacement with no aortic stenosis or aortic insufficiency.  Assessment & Plan    1 acute on chronic  combined systolic/diastolic congestive heart failure-patient continues to improve.  We will continue Lasix at present dose.  Spironolactone not added due to baseline renal insufficiency.  Continue Farxiga.  Continue to follow renal function.  Hopefully can transition to oral diuretic in the next 24 to 48 hours and discharge.    2 ischemic cardiomyopathy-continue Toprol at present dose.  He has an allergy to ACE inhibitors/ARB's/ARNI including angioedema.  We will consider adding hydralazine/nitrates later once it is clear renal function and blood pressure stable.   3 permanent atrial fibrillation-continue Toprol at present dose for rate control.  Continue apixaban.   4 coronary artery disease status post coronary artery bypass and graft-continue statin.  He is not on aspirin given need for apixaban.   5 hyperlipidemia-continue present medical regimen.   6 chronic stage IIIa kidney disease-follow renal function closely with diuresis.   7 status post aortic valve replacement-most recent echocardiogram showed normally functioning valve.   8 status post pacemaker  For questions or updates, please contact [facility] Please consult [email or web address] for contact info  under        Signed, Kirk Ruths, MD  01/18/2022, 8:53 AM

## 2022-01-18 NOTE — Assessment & Plan Note (Signed)
Likely secondary to CHF. VQ scan was negative for PE. Will consult pulmonology.

## 2022-01-18 NOTE — Assessment & Plan Note (Signed)
Blood pressures are normotensive on metoprolol. No ACE as the patient's blood pressure and creatinine would not tolerate it.

## 2022-01-18 NOTE — Assessment & Plan Note (Signed)
Noted. Patient uses CPAP.

## 2022-01-18 NOTE — Progress Notes (Signed)
PROGRESS NOTE  Richard [name]:[id number] DOB: [birth year] DOA: 01/16/2022 PCP: [name], DO  Brief History   Richard [name] is a 81 y.o. male with medical history significant of CHF, Bradycardia (pacemaker), Diffuse large B cell lymphoma (s/p chemo), Esophageal stricture, GERD, gout, HTN, Hyperlipidemia, OSA on CPAP, squamous cell carcinoma of skin, Severe aortic stenosis, DM II.    The patient states that he has been having increasing dyspnea with exertion for the past 3-6 months. He states that several months ago a nephrologist took him off of his lasix and switched him to Torsemide stating that the Lasix was going to burn up his kidneys. This dose of torsemide was continued until last week when the dose was increased by his cardiologist. The patient lost 5 lbs after increased dose. However he continue to become increasingly short of breath with exertion. The patient underwent an echocardiogram and a nuclear medicine stress that demonstrated an EF of 30-35%, but no ischemia, although it is documented that the patient is felt to be high risk for ischemia. Echocardiogram also demonstrated EF of 30%. Repeat echocardiogram is unchanged, but does demonstrated wall motion abnormalities.   The patient followed up with pulmonology this week due to his ongoing shortness of breath. CXR demonstrated pulmonary vascular congestion with some pleural effusion. At this point his diuretic had been increased. Labs were drawn. The patient's creatinine (baseline appears to be 1.3 - 1.5 is now 2.11. His BNP was greater than 1200 yesterday, although today it is down to 998.   Upon further questioning the patient denies any chest pain. He does admit to orthopnea, lower extremity swelling (right leg greater than left), and hemoptysis. No nausea, vomiting, constipation, diarrhea, hematemesis, melena, coffee ground emesis, or melena. No fevers or chills.    In the ED the patient is found to have no change in  the appearance of his pulmonary vascular congestion. Vital signs are stable, and he is saturating in the low to mid 90's on room air. EKG is unchanged from previous. As previously stated the patient's creatinine has increased to 2.11 with a EGFR of 32. Glucose is 191. BNP os 992, and platelets are 133.    The patient has received 60 mg IV lasix in the ED.   D Dimer, VQ scan and Doppler of the right lower extremity have been ordered and are pending.   The patient has been admitted to a telemetry bed. Cardiology has been consulted and the patient has been continued on lasix 60 mg IV bid.   He is maintaining a negative fluid balance.  Consultants  Cardiology Advanced heart failure team  Procedures  None  Antibiotics   Anti-infectives (From admission, onward)    None      Subjective  The patient is resting comfortably. No new complaints.   Objective   Vitals:  Vitals:   01/18/22 0539 01/18/22 1405  BP: 131/75 117/73  Pulse: 70 82  Resp: 17 19  Temp: 98 F (36.7 C) 97.7 F (36.5 C)  SpO2: 97% 94%    Exam:  Constitutional:  The patient is awake, alert, and oriented x 3. No acute distress. Respiratory:  No increased work of breathing. No wheezes, rales, or rhonchi No tactile fremitus Cardiovascular:  Regular rate and rhythm No murmurs, ectopy, or gallups. No lateral PMI. No thrills. Abdomen:  Abdomen is soft, non-tender, non-distended No hernias, masses, or organomegaly Normoactive bowel sounds.  Musculoskeletal:  No cyanosis, clubbing, or edema Skin:  No  rashes, lesions, ulcers palpation of skin: no induration or nodules Neurologic:  CN 2-12 intact Sensation all 4 extremities intact Psychiatric:  Mental status Mood, affect appropriate Orientation to person, place, time  judgment and insight appear intact   I have personally reviewed the following:   Today's Data  Vitals  Lab Data  CBC BMP  Micro Data    Imaging  CXR  Cardiology Data   EKG Echocardiogram  Other Data    Scheduled Meds:  allopurinol  450 mg Oral Daily   apixaban  2.5 mg Oral BID   atorvastatin  80 mg Oral QHS   dapagliflozin propanediol  10 mg Oral Daily   ezetimibe  10 mg Oral Daily   fenofibrate  160 mg Oral Daily   folic acid  2 mg Oral Daily   furosemide  60 mg Intravenous Q12H   icosapent Ethyl  2 g Oral BID   metoprolol succinate  25 mg Oral Daily   potassium chloride SA  40 mEq Oral BID   pregabalin  150 mg Oral BID   sodium chloride flush  3 mL Intravenous Q12H   Continuous Infusions:  sodium chloride      Principal Problem:   Acute exacerbation of CHF (congestive heart failure) (HCC) Active Problems:   Essential hypertension   OSA (obstructive sleep apnea)   Anemia, iron deficiency   Aortic stenosis, severe   Uncontrolled type 2 diabetes mellitus with hyperglycemia (HCC)   Cardiac pacemaker in situ   Hemoptysis   LOS: 2 days   A & P  Assessment and Plan: * Acute exacerbation of CHF (congestive heart failure) ([place]) Echo performed roughly 1 month ago demonstrated EF of 30%. Repeat echo ordered due to worsening of symptoms despite diuresis. It is not much changed.The patient has been admitted to a telemetry bed. Cardiology and the advance heart failure team have been consulted. Will monitor troponins. EKG demonstrates a paced rhythm due to pacemaker placed due to complete heart block. He received 60 mg IV lasix in the ED.   Monitor strict I's and O's. He will receive lasix 60 mg IV BID beginning tomorrow am. Will monitor creatinine and electrolytes as well. He is maintaining a negative fluid balance. Cardiology has been consulted. I appreciate their assistance.   No ACE as the patient's blood pressure and creatinine would not tolerate it.   Hemoptysis Likely secondary to CHF. VQ scan was negative for PE. Will consult pulmonology.  Cardiac pacemaker in situ Noted.   Uncontrolled type 2 diabetes mellitus with hyperglycemia  (HCC) Glucoses will be followed with FSBS and SSI.  Aortic stenosis, severe S/P TAVR. Stable.  Anemia, iron deficiency Hemoglobin is stable at 12.9. Continue iron supplementation and to monitor hemoglobin.  OSA (obstructive sleep apnea) Noted. Patient uses CPAP.  Essential hypertension Blood pressures are normotensive on metoprolol. No ACE as the patient's blood pressure and creatinine would not tolerate it.    I have seen and examined this patient myself. I have spent 32 minutes in his evaluation and care.  DVT prophylaxis: Eliquis Code Status: Full Code Family Communication: None available Disposition Plan: Home    Arieona Swaggerty, DO [facility] Direct contact: see [email or web address]  7PM-7AM contact night coverage as above 01/18/2022, 6:32 PM  LOS: 2 days

## 2022-01-18 NOTE — Assessment & Plan Note (Addendum)
Hemoglobin is stable at 13.4. Continue iron supplementation and to monitor hemoglobin.

## 2022-01-18 NOTE — Progress Notes (Signed)
Telemetry called RN that pt O2 sats were sustaining at 86-88% with CPAP on. RN notified RT who added 2L to CPAP machine. Pt saturated at 92% afterwards.

## 2022-01-18 NOTE — Progress Notes (Signed)
PROGRESS NOTE  Richard [name] TMH:[id number] DOB: October 08, 1940 DOA: 01/16/2022 PCP: [name], DO  Brief History   Richard [name] is a 81 y.o. male with medical history significant of CHF, Bradycardia (pacemaker), Diffuse large B cell lymphoma (s/p chemo), Esophageal stricture, GERD, gout, HTN, Hyperlipidemia, OSA on CPAP, squamous cell carcinoma of skin, Severe aortic stenosis, DM II.    The patient states that he has been having increasing dyspnea with exertion for the past 3-6 months. He states that several months ago a nephrologist took him off of his lasix and switched him to Torsemide stating that the Lasix was going to burn up his kidneys. This dose of torsemide was continued until last week when the dose was increased by his cardiologist. The patient lost 5 lbs after increased dose. However he continue to become increasingly short of breath with exertion. The patient underwent an echocardiogram and a nuclear medicine stress that demonstrated an EF of 30-35%, but no ischemia, although it is documented that the patient is felt to be high risk for ischemia. Echocardiogram also demonstrated EF of 30%.   The patient followed up with pulmonology this week due to his ongoing shortness of breath. CXR demonstrated pulmonary vascular congestion with some pleural effusion. At this point his diuretic had been increased. Labs were drawn. The patient's creatinine (baseline appears to be 1.3 - 1.5 is now 2.11. His BNP was greater than 1200 yesterday, although today it is down to 998.   Upon further questioning the patient denies any chest pain. He does admit to orthopnea, lower extremity swelling (right leg greater than left), and hemoptysis. No nausea, vomiting, constipation, diarrhea, hematemesis, melena, coffee ground emesis, or melena. No fevers or chills.    In the ED the patient is found to have no change in the appearance of his pulmonary vascular congestion. Vital signs are stable, and he is  saturating in the low to mid 90's on room air. EKG is unchanged from previous. As previously stated the patient's creatinine has increased to 2.11 with a EGFR of 32. Glucose is 191. BNP os 992, and platelets are 133.    The patient has received 60 mg IV lasix in the ED.   D Dimer, VQ scan and Doppler of the right lower extremity have been ordered and are pending.   The patient has been admitted to a telemetry bed. Cardiology has been consulted and the patient has been continued on lasix 60 mg IV bid.   Consultants  Cardiology Advanced heart failure team  Procedures  None  Antibiotics   Anti-infectives (From admission, onward)    None      Subjective  The patient is resting comfortably. No new complaints.   Objective   Vitals:  Vitals:   01/18/22 0539 01/18/22 1405  BP: 131/75 117/73  Pulse: 70 82  Resp: 17 19  Temp: 98 F (36.7 C) 97.7 F (36.5 C)  SpO2: 97% 94%    Exam:  Constitutional:  The patient is awake, alert, and oriented x 3. No acute distress. Respiratory:  No increased work of breathing. No wheezes, rales, or rhonchi No tactile fremitus Cardiovascular:  Regular rate and rhythm No murmurs, ectopy, or gallups. No lateral PMI. No thrills. Abdomen:  Abdomen is soft, non-tender, non-distended No hernias, masses, or organomegaly Normoactive bowel sounds.  Musculoskeletal:  No cyanosis, clubbing, or edema Skin:  No rashes, lesions, ulcers palpation of skin: no induration or nodules Neurologic:  CN 2-12 intact Sensation all 4  extremities intact Psychiatric:  Mental status Mood, affect appropriate Orientation to person, place, time  judgment and insight appear intact   I have personally reviewed the following:   Today's Data  Vitals  Lab Data  CBC BMP  Micro Data    Imaging  CXR  Cardiology Data  EKG Echocardiogram  Other Data    Scheduled Meds:  allopurinol  450 mg Oral Daily   apixaban  2.5 mg Oral BID   atorvastatin   80 mg Oral QHS   dapagliflozin propanediol  10 mg Oral Daily   ezetimibe  10 mg Oral Daily   fenofibrate  160 mg Oral Daily   folic acid  2 mg Oral Daily   furosemide  60 mg Intravenous Q12H   icosapent Ethyl  2 g Oral BID   metoprolol succinate  25 mg Oral Daily   potassium chloride SA  40 mEq Oral BID   pregabalin  150 mg Oral BID   sodium chloride flush  3 mL Intravenous Q12H   Continuous Infusions:  sodium chloride      Principal Problem:   Acute exacerbation of CHF (congestive heart failure) (HCC) Active Problems:   Essential hypertension   OSA (obstructive sleep apnea)   Anemia, iron deficiency   Aortic stenosis, severe   Acute on chronic diastolic CHF (congestive heart failure), NYHA class 2 ([place])   Uncontrolled type 2 diabetes mellitus with hyperglycemia (HCC)   Cardiac pacemaker in situ   Hemoptysis   LOS: 2 days   A & P  Assessment and Plan: * Acute exacerbation of CHF (congestive heart failure) ([place]) Echo performed roughly 1 month ago demonstrated EF of 30%. Repeat echo ordered due to worsening of symptoms despite diuresis. It is not much changed.The patient has been admitted to a telemetry bed. Cardiology and the advance heart failure team have been consulted. Will monitor troponins. EKG demonstrates a paced rhythm due to pacemaker placed due to complete heart block. He received 60 mg IV lasix in the ED.   Monitor strict I's and O's. He will receive lasix 60 mg IV BID beginning tomorrow am. Will monitor creatinine and electrolytes as well. He is maintaining a negative fluid balance. Cardiology has been consulted. I appreciate their assistance.   No ACE as the patient's blood pressure and creatinine would not tolerate it.   Hemoptysis Likely secondary to CHF. VQ scan was negative for PE. Will consult pulmonology.  Cardiac pacemaker in situ Noted.   Uncontrolled type 2 diabetes mellitus with hyperglycemia (HCC) Glucoses will be followed with FSBS and  SSI.  Aortic stenosis, severe S/P TAVR. Stable.  Anemia, iron deficiency Hemoglobin is stable at 12.9. Continue iron supplementation and to monitor hemoglobin.  OSA (obstructive sleep apnea) Noted. Patient uses CPAP.  Essential hypertension Blood pressures are normotensive on metoprolol. No ACE as the patient's blood pressure and creatinine would not tolerate it.    I have seen and examined this patient myself. I have spent 34 minutes in his evaluation and care.  DVT prophylaxis: Eliquis Code Status: Full Code Family Communication: None available Disposition Plan: Home    Danuel Felicetti, DO [facility] Direct contact: see [email or web address]  7PM-7AM contact night coverage as above 01/17/2022, 4:46 PM  LOS: 2 days

## 2022-01-18 NOTE — Assessment & Plan Note (Signed)
Glucoses will be followed with FSBS and SSI.

## 2022-01-18 NOTE — Plan of Care (Signed)
  Problem: Health Behavior/Discharge Planning: Goal: Ability to manage health-related needs will improve Outcome: Completed/Met   Problem: Clinical Measurements: Goal: Respiratory complications will improve Outcome: Progressing   Problem: Nutrition: Goal: Adequate nutrition will be maintained Outcome: Completed/Met   Problem: Coping: Goal: Level of anxiety will decrease Outcome: Completed/Met

## 2022-01-18 NOTE — Assessment & Plan Note (Signed)
Noted  

## 2022-01-18 NOTE — Assessment & Plan Note (Signed)
S/P TAVR. Stable.

## 2022-01-19 DIAGNOSIS — Z950 Presence of cardiac pacemaker: Secondary | ICD-10-CM | POA: Diagnosis not present

## 2022-01-19 DIAGNOSIS — I5023 Acute on chronic systolic (congestive) heart failure: Secondary | ICD-10-CM | POA: Diagnosis not present

## 2022-01-19 DIAGNOSIS — G4733 Obstructive sleep apnea (adult) (pediatric): Secondary | ICD-10-CM | POA: Diagnosis not present

## 2022-01-19 DIAGNOSIS — I350 Nonrheumatic aortic (valve) stenosis: Secondary | ICD-10-CM | POA: Diagnosis not present

## 2022-01-19 LAB — BASIC METABOLIC PANEL
Anion gap: 11 (ref 5–15)
BUN: 49 mg/dL — ABNORMAL HIGH (ref 8–23)
CO2: 26 mmol/L (ref 22–32)
Calcium: 10.2 mg/dL (ref 8.9–10.3)
Chloride: 105 mmol/L (ref 98–111)
Creatinine, Ser: 1.89 mg/dL — ABNORMAL HIGH (ref 0.61–1.24)
GFR, Estimated: 35 mL/min — ABNORMAL LOW (ref >60)
Glucose, Bld: 143 mg/dL — ABNORMAL HIGH (ref 70–99)
Potassium: 4.1 mmol/L (ref 3.5–5.1)
Sodium: 142 mmol/L (ref 135–145)

## 2022-01-19 MED ORDER — FUROSEMIDE 10 MG/ML IJ SOLN
80.0000 mg | Freq: Two times a day (BID) | INTRAMUSCULAR | Status: DC
  Administered 2022-01-19 – 2022-01-20 (×2): 80 mg via INTRAVENOUS
  Filled 2022-01-19 (×2): qty 8

## 2022-01-19 MED ORDER — SALINE SPRAY 0.65 % NA SOLN
1.0000 | NASAL | Status: DC | PRN
Start: 2022-01-19 — End: 2022-01-23
  Administered 2022-01-19: 1 via NASAL
  Filled 2022-01-19: qty 44

## 2022-01-19 MED ORDER — POTASSIUM CHLORIDE CRYS ER 20 MEQ PO TBCR
60.0000 meq | EXTENDED_RELEASE_TABLET | Freq: Two times a day (BID) | ORAL | Status: DC
  Administered 2022-01-19: 60 meq via ORAL
  Filled 2022-01-19: qty 3

## 2022-01-19 NOTE — Progress Notes (Signed)
Progress Note  Patient Name: Richard [name] Date of Encounter: 01/19/2022  [facility] Cardiologist: Minus Breeding, MD   Subjective   No CP or dyspnea. Walking up and down the halls: 3 weeks PTA was playing golf and wants to get back to this.  Inpatient Medications    Scheduled Meds:  allopurinol  450 mg Oral Daily   apixaban  2.5 mg Oral BID   atorvastatin  80 mg Oral QHS   dapagliflozin propanediol  10 mg Oral Daily   ezetimibe  10 mg Oral Daily   fenofibrate  160 mg Oral Daily   folic acid  2 mg Oral Daily   furosemide  60 mg Intravenous Q12H   icosapent Ethyl  2 g Oral BID   metoprolol succinate  25 mg Oral Daily   potassium chloride SA  40 mEq Oral BID   pregabalin  150 mg Oral BID   psyllium  1 packet Oral BID   sodium chloride flush  3 mL Intravenous Q12H   Continuous Infusions:  sodium chloride     PRN Meds: sodium chloride, acetaminophen, alum & mag hydroxide-simeth, ondansetron (ZOFRAN) IV, mouth rinse, sodium chloride, sodium chloride flush   Vital Signs    Vitals:   01/18/22 1835 01/18/22 2046 01/19/22 0250 01/19/22 0417  BP: 114/72 99/68  107/72  Pulse: 82 89  76  Resp:  18  16  Temp: 98.6 F (37 C) 98 F (36.7 C)  (!) 97.1 F (36.2 C)  TempSrc:  Oral  Axillary  SpO2: 93% 91%  96%  Weight:   107.4 kg   Height:        Intake/Output Summary (Last 24 hours) at 01/19/2022 0752 Last data filed at 01/19/2022 0300 Gross per 24 hour  Intake 1160 ml  Output 3945 ml  Net -2785 ml      01/19/2022    2:50 AM 01/18/2022    3:09 AM 01/17/2022    4:38 AM  Last 3 Weights  Weight (lbs) 236 lb 12.8 oz 242 lb 6.4 oz 249 lb 1.9 oz  Weight (kg) 107.412 kg 109.952 kg 113 kg      Telemetry    Atrial fibrillation rate controlled VP with one 4 beat run of NSVT - Personally Reviewed  Physical Exam   GEN: No acute distress.   Cardiac: irregular no murmur Respiratory: Clear to auscultation bilaterally. GI: Soft, non-tenderly distended with + fluid  wave and dullness to percussion MS: 1+ edema Neuro:  Nonfocal  Psych: Pleasant affect   Labs    High Sensitivity Troponin:   Recent Labs  Lab 01/17/22 1147 01/17/22 1400  TROPONINIHS 34* 30*     Chemistry Recent Labs  Lab 01/16/22 1202 01/17/22 0514 01/18/22 0422 01/19/22 0544  NA 142 143 141 142  K 4.3 4.1 3.8 4.1  CL 108 108 104 105  CO2 '24 24 26 26  '$ GLUCOSE 191* 96 116* 143*  BUN 45* 43* 45* 49*  CREATININE 2.04* 1.78* 2.05* 1.89*  CALCIUM 9.9 9.8 9.9 10.2  PROT 7.4  --   --   --   ALBUMIN 4.3  --   --   --   AST 18  --   --   --   ALT 17  --   --   --   ALKPHOS 77  --   --   --   BILITOT 1.1  --   --   --   GFRNONAA 32* 37* 32* 35*  ANIONGAP '10 11 11 11     '$ Hematology Recent Labs  Lab 01/16/22 1202 01/17/22 0514  WBC 7.5 7.0  RBC 4.31 4.21*  HGB 12.5* 11.9*  HCT 39.9 39.0  MCV 92.6 92.6  MCH 29.0 28.3  MCHC 31.3 30.5  RDW 18.9* 18.7*  PLT 133* 131*    BNP Recent Labs  Lab 01/15/22 1122 01/16/22 1202 01/17/22 0514  BNP  --  992.0* 969.2*  PROBNP 1,211.0*  --   --     DDimer  Recent Labs  Lab 01/16/22 1920  DDIMER 0.49     Radiology    ECHOCARDIOGRAM COMPLETE  Result Date: 01/17/2022    ECHOCARDIOGRAM REPORT   Patient Name:   [name] Date of Exam: 01/17/2022 Medical Rec #:  [id number]      Height:       72.0 in Accession #:    [id number]     Weight:       249.1 lb Date of Birth:  02/06/1941      BSA:          2.339 m Patient Age:    81 years       BP:           101/60 mmHg Patient Gender: M              HR:           81 bpm. Exam Location:  Inpatient Procedure: 2D Echo, Cardiac Doppler and Color Doppler Indications:    CHF  History:        Patient has prior history of Echocardiogram examinations, most                 recent 12/18/2021. CHF, CAD; Risk Factors:Hypertension, Sleep                 Apnea and Diabetes.                 Aortic Valve: 26 mm Sapien prosthetic, stented (TAVR) valve is                 present in the aortic  position.  Sonographer:    Jefferey Pica Referring Phys: [id number] AVA SWAYZE IMPRESSIONS  1. Left ventricular ejection fraction, by estimation, is 30 to 35%. The left ventricle has moderately decreased function. The left ventricle demonstrates regional wall motion abnormalities (see scoring diagram/findings for description). The left ventricular internal cavity size was moderately dilated. Left ventricular diastolic parameters are consistent with Grade II diastolic dysfunction (pseudonormalization). Elevated left ventricular end-diastolic pressure.  2. Right ventricular systolic function is normal. The right ventricular size is normal. There is mildly elevated pulmonary artery systolic pressure.  3. Left atrial size was mildly dilated.  4. Right atrial size was severely dilated.  5. The mitral valve is normal in structure. Trivial mitral valve regurgitation. No evidence of mitral stenosis. Moderate mitral annular calcification.  6. Tricuspid valve regurgitation is mild to moderate.  7. The aortic valve has been repaired/replaced. Aortic valve regurgitation is not visualized. No aortic stenosis is present. There is a 26 mm Sapien prosthetic (TAVR) valve present in the aortic position.  8. Aortic dilatation noted. There is borderline dilatation of the ascending aorta, measuring 36 mm.  9. The inferior vena cava is dilated in size with <50% respiratory variability, suggesting right atrial pressure of 15 mmHg. FINDINGS  Left Ventricle: Left ventricular ejection fraction, by estimation, is 30 to 35%. The left ventricle has moderately decreased function.  The left ventricle demonstrates regional wall motion abnormalities. The left ventricular internal cavity size was moderately dilated. There is no left ventricular hypertrophy. Left ventricular diastolic parameters are consistent with Grade II diastolic dysfunction (pseudonormalization). Elevated left ventricular end-diastolic pressure.  LV Wall Scoring: The anterior septum  and apex are akinetic. The entire anterior wall, entire lateral wall, inferior septum, and entire inferior wall are hypokinetic. Right Ventricle: The right ventricular size is normal. No increase in right ventricular wall thickness. Right ventricular systolic function is normal. There is mildly elevated pulmonary artery systolic pressure. The tricuspid regurgitant velocity is 2.57  m/s, and with an assumed right atrial pressure of 15 mmHg, the estimated right ventricular systolic pressure is 40.8 mmHg. Left Atrium: Left atrial size was mildly dilated. Right Atrium: Right atrial size was severely dilated. Pericardium: There is no evidence of pericardial effusion. Mitral Valve: The mitral valve is normal in structure. Moderate mitral annular calcification. Trivial mitral valve regurgitation. No evidence of mitral valve stenosis. Tricuspid Valve: The tricuspid valve is normal in structure. Tricuspid valve regurgitation is mild to moderate. No evidence of tricuspid stenosis. Aortic Valve: The aortic valve has been repaired/replaced. Aortic valve regurgitation is not visualized. No aortic stenosis is present. Aortic valve peak gradient measures 14.4 mmHg. There is a 26 mm Sapien prosthetic, stented (TAVR) valve present in the  aortic position. Pulmonic Valve: The pulmonic valve was normal in structure. Pulmonic valve regurgitation is trivial. No evidence of pulmonic stenosis. Aorta: Aortic dilatation noted. There is borderline dilatation of the ascending aorta, measuring 36 mm. Venous: The inferior vena cava is dilated in size with less than 50% respiratory variability, suggesting right atrial pressure of 15 mmHg. IAS/Shunts: No atrial level shunt detected by color flow Doppler.  LEFT VENTRICLE PLAX 2D LVIDd:         6.10 cm   Diastology LVIDs:         5.10 cm   LV e' lateral:   7.43 cm/s LV PW:         1.20 cm   LV E/e' lateral: 16.4 LV IVS:        1.00 cm LVOT diam:     2.10 cm LV SV:         96 LV SV Index:   41 LVOT  Area:     3.46 cm  RIGHT VENTRICLE            IVC RV Basal diam:  4.10 cm    IVC diam: 3.20 cm RV Mid diam:    4.50 cm RV S prime:     6.45 cm/s TAPSE (M-mode): 1.0 cm LEFT ATRIUM             Index        RIGHT ATRIUM           Index LA diam:        5.60 cm 2.39 cm/m   RA Area:     34.10 cm LA Vol (A2C):   72.9 ml 31.17 ml/m  RA Volume:   130.00 ml 55.58 ml/m LA Vol (A4C):   76.4 ml 32.66 ml/m LA Biplane Vol: 75.6 ml 32.32 ml/m  AORTIC VALVE                 PULMONIC VALVE AV Area (Vmax): 2.72 cm     PV Vmax:       0.83 m/s AV Vmax:        189.50 cm/s  PV Peak grad:  2.8 mmHg  AV Peak Grad:   14.4 mmHg LVOT Vmax:      149.00 cm/s LVOT Vmean:     93.400 cm/s LVOT VTI:       0.278 m  AORTA Ao Root diam: 3.40 cm Ao Asc diam:  3.60 cm MITRAL VALVE                TRICUSPID VALVE MV Area (PHT): 3.07 cm     TR Peak grad:   26.4 mmHg MV Decel Time: 247 msec     TR Vmax:        257.00 cm/s MR Peak grad: 84.6 mmHg MR Vmax:      460.00 cm/s   SHUNTS MV E velocity: 122.00 cm/s  Systemic VTI:  0.28 m MV A velocity: 83.90 cm/s   Systemic Diam: 2.10 cm MV E/A ratio:  1.45 Skeet Latch MD Electronically signed by Skeet Latch MD Signature Date/Time: 01/17/2022/3:55:48 PM    Final    NM Pulmonary Perfusion  Result Date: 01/17/2022 CLINICAL DATA:  High probability pulmonary embolus. EXAM: NUCLEAR MEDICINE PERFUSION LUNG SCAN TECHNIQUE: Perfusion images were obtained in multiple projections after intravenous injection of radiopharmaceutical. Ventilation scans intentionally deferred if perfusion scan and chest x-ray adequate for interpretation during COVID 19 epidemic. RADIOPHARMACEUTICALS:  4.4 mCi Tc-51mMAA IV COMPARISON:  Chest radiograph January 16, 2022 FINDINGS: No large wedge-shaped segmental perfusion defects. Relative homogeneous radiotracer uptake in the bilateral lungs. IMPRESSION: No scintigraphic evidence of pulmonary embolus. Electronically Signed   By: JDahlia BailiffM.D.   On: 01/17/2022 11:17       Patient Profile      81year old male with past medical history of chronic combined systolic/diastolic congestive heart failure, ischemic cardiomyopathy, coronary artery disease status post coronary bypass and graft, history of TAVR, prior pacemaker with removal and MICRA in 2023, chronic stage III kidney disease, permanent atrial fibrillation, history of angioedema with ACE inhibitor, diabetes mellitus, history of B-cell lymphoma for evaluation of acute on chronic combined systolic/diastolic congestive heart failure.  Most recent echocardiogram shows ejection fraction 30 to 35%, moderate left ventricular enlargement, grade 2 diastolic dysfunction, biatrial enlargement, mild to moderate tricuspid regurgitation, status post aortic valve replacement with no aortic stenosis or aortic insufficiency.  Assessment & Plan    Heart Failure Reduced Ejection Fraction  Chronic stage IIIa kidney disease Ischemic cardiomyopathy S/p TAVR without evidence of prosthetic valve dysfunction - NYHA class II, Stage C, hypervolemic, etiology is likely multi-factorial - Diuretic regimen: He is still notably volume up, will increase to 80 IV Lasix BID - MRA note started for AKI issues - continue SGLT2i   - Patient notes he has an allergy to ACE inhibitors/ARB's/ARNI including angioedema.   - BP would not tolerate additional afterload reduction agents    Permanent atrial fibrillation Tachy brady s/p PPM with revision and s/p MICRA - CHADSVASC 5 - continue Toprol at present dose for rate control.   - Continue apixaban.   Coronary artery disease status post coronary artery bypass HLD - Continue current meds (he is on statin, zetia, fenofibrate and vascepa)   For questions or updates, please contact [email or web address] consult [email or web address] for contact info under        Signed, MWerner Lean MD  01/19/2022, 7:52 AM

## 2022-01-19 NOTE — Progress Notes (Signed)
Pt care assumed from previous RN at 1500 this shift. I have reviewed their assessment and agree with their findings. Pt is currently resting comfortably in bed in no acute distress.

## 2022-01-19 NOTE — Progress Notes (Signed)
PROGRESS NOTE  Richard [name] BHA:[id number] DOB: 06/07/1941 DOA: 01/16/2022 PCP: [name], DO  Brief History   Richard [name] is a 81 y.o. male with medical history significant of CHF, Bradycardia (pacemaker), Diffuse large B cell lymphoma (s/p chemo), Esophageal stricture, GERD, gout, HTN, Hyperlipidemia, OSA on CPAP, squamous cell carcinoma of skin, Severe aortic stenosis, DM II.    The patient states that he has been having increasing dyspnea with exertion for the past 3-6 months. He states that several months ago a nephrologist took him off of his lasix and switched him to Torsemide stating that the Lasix was going to burn up his kidneys. This dose of torsemide was continued until last week when the dose was increased by his cardiologist. The patient lost 5 lbs after increased dose. However he continue to become increasingly short of breath with exertion. The patient underwent an echocardiogram and a nuclear medicine stress that demonstrated an EF of 30-35%, but no ischemia, although it is documented that the patient is felt to be high risk for ischemia. Echocardiogram also demonstrated EF of 30%. Repeat echocardiogram is unchanged, but does demonstrated wall motion abnormalities.   The patient followed up with pulmonology this week due to his ongoing shortness of breath. CXR demonstrated pulmonary vascular congestion with some pleural effusion. At this point his diuretic had been increased. Labs were drawn. The patient's creatinine (baseline appears to be 1.3 - 1.5 is now 2.11. His BNP was greater than 1200 yesterday, although today it is down to 998.   Upon further questioning the patient denies any chest pain. He does admit to orthopnea, lower extremity swelling (right leg greater than left), and hemoptysis. No nausea, vomiting, constipation, diarrhea, hematemesis, melena, coffee ground emesis, or melena. No fevers or chills.    In the ED the patient is found to have no change in  the appearance of his pulmonary vascular congestion. Vital signs are stable, and he is saturating in the low to mid 90's on room air. EKG is unchanged from previous. As previously stated the patient's creatinine has increased to 2.11 with a EGFR of 32. Glucose is 191. BNP os 992, and platelets are 133.    The patient has received 60 mg IV lasix in the ED.   D Dimer, VQ scan and Doppler of the right lower extremity have been ordered and are pending.   The patient has been admitted to a telemetry bed. Cardiology has been consulted and the patient has been continued on lasix 60 mg IV bid.   He is maintaining a negative fluid balance.  Consultants  Cardiology Advanced heart failure team  Procedures  None  Antibiotics   Anti-infectives (From admission, onward)    None      Subjective  The patient is resting comfortably. No new complaints.   Objective   Vitals:  Vitals:   01/19/22 1021 01/19/22 1407  BP: 125/75 (!) 151/83  Pulse: 88 82  Resp: 18   Temp:  97.6 F (36.4 C)  SpO2: 96% 95%    Exam:  Constitutional:  The patient is awake, alert, and oriented x 3. No acute distress. Respiratory:  No increased work of breathing. No wheezes, rales, or rhonchi No tactile fremitus Cardiovascular:  Regular rate and rhythm No murmurs, ectopy, or gallups. No lateral PMI. No thrills. Abdomen:  Abdomen is soft, non-tender, non-distended No hernias, masses, or organomegaly Normoactive bowel sounds.  Musculoskeletal:  Edema of lower extremities bilaterally are greatly improved. Still with  2-3 + pitting edema. Skin:  No rashes, lesions, ulcers palpation of skin: no induration or nodules Neurologic:  CN 2-12 intact Sensation all 4 extremities intact Psychiatric:  Mental status Mood, affect appropriate Orientation to person, place, time  judgment and insight appear intact  I have personally reviewed the following:   Today's Data  Vitals  Lab Data  CBC BMP  Micro  Data    Imaging  CXR  Cardiology Data  EKG Echocardiogram  Other Data    Scheduled Meds:  allopurinol  450 mg Oral Daily   apixaban  2.5 mg Oral BID   atorvastatin  80 mg Oral QHS   dapagliflozin propanediol  10 mg Oral Daily   ezetimibe  10 mg Oral Daily   fenofibrate  160 mg Oral Daily   folic acid  2 mg Oral Daily   furosemide  80 mg Intravenous Q12H   icosapent Ethyl  2 g Oral BID   metoprolol succinate  25 mg Oral Daily   potassium chloride SA  60 mEq Oral BID   pregabalin  150 mg Oral BID   psyllium  1 packet Oral BID   sodium chloride flush  3 mL Intravenous Q12H   Continuous Infusions:  sodium chloride      Principal Problem:   Acute exacerbation of CHF (congestive heart failure) (HCC) Active Problems:   Essential hypertension   OSA (obstructive sleep apnea)   Anemia, iron deficiency   Aortic stenosis, severe   Uncontrolled type 2 diabetes mellitus with hyperglycemia (HCC)   Cardiac pacemaker in situ   Hemoptysis   LOS: 3 days   A & P  Assessment and Plan: * Acute exacerbation of CHF (congestive heart failure) ([place]) Echo performed roughly 1 month ago demonstrated EF of 30%. Repeat echo ordered due to worsening of symptoms despite diuresis. It is not much changed.The patient has been admitted to a telemetry bed. Cardiology and the advance heart failure team have been consulted. Will monitor troponins. EKG demonstrates a paced rhythm due to pacemaker placed due to complete heart block. He received 60 mg IV lasix in the ED.   Monitor strict I's and O's. He is receiving lasix 80 mg IV BID beginning tomorrow am. Will monitor creatinine and electrolytes as well. He is maintaining a negative fluid balance. Cardiology has been consulted. I appreciate their assistance.   No ACE as the patient's blood pressure and creatinine would not tolerate it.   Hemoptysis Likely secondary to CHF. VQ scan was negative for PE. Will consult pulmonology if continues.  Cardiac  pacemaker in situ Noted.   Uncontrolled type 2 diabetes mellitus with hyperglycemia (HCC) Glucoses will be followed with FSBS and SSI.  Aortic stenosis, severe S/P TAVR. Stable.  Anemia, iron deficiency Hemoglobin is stable at 11.9. Continue iron supplementation and to monitor hemoglobin.  OSA (obstructive sleep apnea) Noted. Patient uses CPAP.  Essential hypertension Blood pressures are normotensive on metoprolol. No ACE as the patient's blood pressure and creatinine would not tolerate it.    I have seen and examined this patient myself. I have spent 34 minutes in his evaluation and care.  DVT prophylaxis: Eliquis Code Status: Full Code Family Communication: None available Disposition Plan: Home    Richard Steinmeyer, DO [facility] Direct contact: see [email or web address]  7PM-7AM contact night coverage as above 01/19/2022, 5:18 PM  LOS: 2 days

## 2022-01-19 NOTE — Progress Notes (Deleted)
error 

## 2022-01-20 DIAGNOSIS — Z950 Presence of cardiac pacemaker: Secondary | ICD-10-CM | POA: Diagnosis not present

## 2022-01-20 DIAGNOSIS — I5043 Acute on chronic combined systolic (congestive) and diastolic (congestive) heart failure: Secondary | ICD-10-CM | POA: Diagnosis not present

## 2022-01-20 DIAGNOSIS — I4821 Permanent atrial fibrillation: Secondary | ICD-10-CM | POA: Diagnosis not present

## 2022-01-20 DIAGNOSIS — E782 Mixed hyperlipidemia: Secondary | ICD-10-CM | POA: Diagnosis not present

## 2022-01-20 LAB — CBC WITH DIFFERENTIAL/PLATELET
Abs Immature Granulocytes: 0.04 10*3/uL (ref 0.00–0.07)
Basophils Absolute: 0.1 10*3/uL (ref 0.0–0.1)
Basophils Relative: 1 %
Eosinophils Absolute: 0.3 10*3/uL (ref 0.0–0.5)
Eosinophils Relative: 4 %
HCT: 43.8 % (ref 39.0–52.0)
Hemoglobin: 13.6 g/dL (ref 13.0–17.0)
Immature Granulocytes: 1 %
Lymphocytes Relative: 23 %
Lymphs Abs: 1.7 10*3/uL (ref 0.7–4.0)
MCH: 28.3 pg (ref 26.0–34.0)
MCHC: 31.1 g/dL (ref 30.0–36.0)
MCV: 91.3 fL (ref 80.0–100.0)
Monocytes Absolute: 0.7 10*3/uL (ref 0.1–1.0)
Monocytes Relative: 10 %
Neutro Abs: 4.5 10*3/uL (ref 1.7–7.7)
Neutrophils Relative %: 61 %
Platelets: 185 10*3/uL (ref 150–400)
RBC: 4.8 MIL/uL (ref 4.22–5.81)
RDW: 18.1 % — ABNORMAL HIGH (ref 11.5–15.5)
WBC: 7.2 10*3/uL (ref 4.0–10.5)
nRBC: 0 % (ref 0.0–0.2)

## 2022-01-20 LAB — BASIC METABOLIC PANEL
Anion gap: 13 (ref 5–15)
BUN: 46 mg/dL — ABNORMAL HIGH (ref 8–23)
CO2: 26 mmol/L (ref 22–32)
Calcium: 10.1 mg/dL (ref 8.9–10.3)
Chloride: 102 mmol/L (ref 98–111)
Creatinine, Ser: 1.68 mg/dL — ABNORMAL HIGH (ref 0.61–1.24)
GFR, Estimated: 40 mL/min — ABNORMAL LOW (ref >60)
Glucose, Bld: 142 mg/dL — ABNORMAL HIGH (ref 70–99)
Potassium: 4.2 mmol/L (ref 3.5–5.1)
Sodium: 141 mmol/L (ref 135–145)

## 2022-01-20 LAB — GLUCOSE, CAPILLARY: Glucose-Capillary: 147 mg/dL — ABNORMAL HIGH (ref 70–99)

## 2022-01-20 MED ORDER — POTASSIUM CHLORIDE CRYS ER 20 MEQ PO TBCR
80.0000 meq | EXTENDED_RELEASE_TABLET | Freq: Two times a day (BID) | ORAL | Status: DC
  Administered 2022-01-20 – 2022-01-22 (×5): 80 meq via ORAL
  Filled 2022-01-20 (×5): qty 4

## 2022-01-20 MED ORDER — FUROSEMIDE 10 MG/ML IJ SOLN
100.0000 mg | Freq: Two times a day (BID) | INTRAVENOUS | Status: DC
  Administered 2022-01-20 – 2022-01-21 (×3): 100 mg via INTRAVENOUS
  Filled 2022-01-20 (×4): qty 10

## 2022-01-20 MED ORDER — DOCUSATE SODIUM 100 MG PO CAPS
200.0000 mg | ORAL_CAPSULE | Freq: Once | ORAL | Status: AC
Start: 2022-01-20 — End: 2022-01-20
  Administered 2022-01-20: 200 mg via ORAL
  Filled 2022-01-20: qty 2

## 2022-01-20 NOTE — Progress Notes (Signed)
Pt informed RN that he has an appointment scheduled for tomorrow morning with cardiology and would need to cancel this appointment due to hospitalization. RN unable to cancel at this time because office is closed. Carmita Boom, Laurel Dimmer, RN

## 2022-01-20 NOTE — Plan of Care (Signed)
  Problem: Clinical Measurements: Goal: Respiratory complications will improve Outcome: Completed/Met Goal: Cardiovascular complication will be avoided Outcome: Progressing

## 2022-01-20 NOTE — Progress Notes (Signed)
PROGRESS NOTE  Richard [name] DXI:[id number] DOB: June 01, 1941 DOA: 01/16/2022 PCP: [name], DO  Brief History   Richard [name] is a 81 y.o. male with medical history significant of CHF, Bradycardia (pacemaker), Diffuse large B cell lymphoma (s/p chemo), Esophageal stricture, GERD, gout, HTN, Hyperlipidemia, OSA on CPAP, squamous cell carcinoma of skin, Severe aortic stenosis, DM II.    The patient states that he has been having increasing dyspnea with exertion for the past 3-6 months. He states that several months ago a nephrologist took him off of his lasix and switched him to Torsemide stating that the Lasix was going to burn up his kidneys. This dose of torsemide was continued until last week when the dose was increased by his cardiologist. The patient lost 5 lbs after increased dose. However he continue to become increasingly short of breath with exertion. The patient underwent an echocardiogram and a nuclear medicine stress that demonstrated an EF of 30-35%, but no ischemia, although it is documented that the patient is felt to be high risk for ischemia. Echocardiogram also demonstrated EF of 30%. Repeat echocardiogram is unchanged, but does demonstrated wall motion abnormalities.   The patient followed up with pulmonology this week due to his ongoing shortness of breath. CXR demonstrated pulmonary vascular congestion with some pleural effusion. At this point his diuretic had been increased. Labs were drawn. The patient's creatinine (baseline appears to be 1.3 - 1.5 is now 2.11. His BNP was greater than 1200 yesterday, although today it is down to 998.   Upon further questioning the patient denies any chest pain. He does admit to orthopnea, lower extremity swelling (right leg greater than left), and hemoptysis. No nausea, vomiting, constipation, diarrhea, hematemesis, melena, coffee ground emesis, or melena. No fevers or chills.    In the ED the patient is found to have no change in  the appearance of his pulmonary vascular congestion. Vital signs are stable, and he is saturating in the low to mid 90's on room air. EKG is unchanged from previous. As previously stated the patient's creatinine has increased to 2.11 with a EGFR of 32. Glucose is 191. BNP os 992, and platelets are 133.    The patient has received 60 mg IV lasix in the ED.   D Dimer, VQ scan and Doppler of the right lower extremity have been ordered and are pending.   The patient has been admitted to a telemetry bed. Cardiology has been consulted and the patient has been continued on lasix 80 mg IV bid.   The patient had severe leg cramps on the morning of 01/20/2022.  He is maintaining a negative fluid balance.  Consultants  Cardiology Advanced heart failure team  Procedures  None  Antibiotics   Anti-infectives (From admission, onward)    None      Subjective  The patient is resting comfortably. No new complaints.   Objective   Vitals:  Vitals:   01/20/22 0505 01/20/22 1100  BP: 116/76 96/78  Pulse: 72 77  Resp: 18 20  Temp: 97.7 F (36.5 C) 97.7 F (36.5 C)  SpO2: 96% 96%    Exam:  Constitutional:  The patient is awake, alert, and oriented x 3. No acute distress. Respiratory:  No increased work of breathing. No wheezes, rales, or rhonchi No tactile fremitus Cardiovascular:  Regular rate and rhythm No murmurs, ectopy, or gallups. No lateral PMI. No thrills. Abdomen:  Abdomen is soft, non-tender, non-distended No hernias, masses, or organomegaly Normoactive bowel  sounds.  Musculoskeletal:  Edema of lower extremities bilaterally are greatly improved. Still with 2+ pitting edema bilaterally. Skin:  No rashes, lesions, ulcers palpation of skin: no induration or nodules Neurologic:  CN 2-12 intact Sensation all 4 extremities intact Psychiatric:  Mental status Mood, affect appropriate Orientation to person, place, time  judgment and insight appear intact  I have  personally reviewed the following:   Today's Data  Vitals  Lab Data  CBC BMP  Micro Data    Imaging  CXR  Cardiology Data  EKG Echocardiogram  Other Data    Scheduled Meds:  allopurinol  450 mg Oral Daily   apixaban  2.5 mg Oral BID   atorvastatin  80 mg Oral QHS   dapagliflozin propanediol  10 mg Oral Daily   ezetimibe  10 mg Oral Daily   fenofibrate  160 mg Oral Daily   folic acid  2 mg Oral Daily   icosapent Ethyl  2 g Oral BID   metoprolol succinate  25 mg Oral Daily   potassium chloride SA  80 mEq Oral BID   pregabalin  150 mg Oral BID   psyllium  1 packet Oral BID   sodium chloride flush  3 mL Intravenous Q12H   Continuous Infusions:  sodium chloride     furosemide Stopped (01/20/22 1419)    Principal Problem:   Acute exacerbation of CHF (congestive heart failure) (HCC) Active Problems:   Essential hypertension   OSA (obstructive sleep apnea)   Anemia, iron deficiency   Aortic stenosis, severe   Uncontrolled type 2 diabetes mellitus with hyperglycemia (HCC)   Cardiac pacemaker in situ   Hemoptysis   LOS: 4 days   A & P   Assessment and Plan: * Acute exacerbation of CHF (congestive heart failure) ([place]) Echo performed roughly 1 month ago demonstrated EF of 30%. Repeat echo ordered due to worsening of symptoms despite diuresis. It is not much changed.The patient has been admitted to a telemetry bed. Cardiology and the advance heart failure team have been consulted. Will monitor troponins. EKG demonstrates a paced rhythm due to pacemaker placed due to complete heart block. He received 60 mg IV lasix in the ED.   Monitor strict I's and O's. He is receiving lasix 80 mg IV BID beginning tomorrow am. Will monitor creatinine and electrolytes as well. He is maintaining a negative fluid balance. Cardiology has been consulted. I appreciate their assistance.   No ACE as the patient's blood pressure and creatinine would not tolerate it.   Hemoptysis Likely  secondary to CHF. VQ scan negative for PE. Now resolved with aggressive treatment of CHF.   Cardiac pacemaker in situ Noted.   Uncontrolled type 2 diabetes mellitus with hyperglycemia (HCC) Glucoses will be followed with FSBS and SSI.  Aortic stenosis, severe S/P TAVR. Stable.  Anemia, iron deficiency Hemoglobin is stable at 13.6. Continue iron supplementation and to monitor hemoglobin.  OSA (obstructive sleep apnea) Noted. Patient uses CPAP.  Essential hypertension Blood pressures are normotensive on metoprolol. No ACE as the patient's blood pressure and creatinine would not tolerate it.    I have seen and examined this patient myself. I have spent 32 minutes in his evaluation and care.  DVT prophylaxis: Eliquis Code Status: Full Code Family Communication: None available Disposition Plan: Home    Richard Juhasz, DO [facility] Direct contact: see [email or web address]  7PM-7AM contact night coverage as above 01/20/2022, 3:19 PM  LOS: 2 days

## 2022-01-20 NOTE — Progress Notes (Signed)
Progress Note  Patient Name: Richard [name] Date of Encounter: 01/20/2022  [facility] Cardiologist: Minus Breeding, MD   Subjective   - 3 L overnight Patient notes early AM leg cramps that woke up him. Noted constipation. No CP SOB. Last PM did two labs around the unit.  Inpatient Medications    Scheduled Meds:  allopurinol  450 mg Oral Daily   apixaban  2.5 mg Oral BID   atorvastatin  80 mg Oral QHS   dapagliflozin propanediol  10 mg Oral Daily   ezetimibe  10 mg Oral Daily   fenofibrate  160 mg Oral Daily   folic acid  2 mg Oral Daily   furosemide  80 mg Intravenous Q12H   icosapent Ethyl  2 g Oral BID   metoprolol succinate  25 mg Oral Daily   potassium chloride SA  60 mEq Oral BID   pregabalin  150 mg Oral BID   psyllium  1 packet Oral BID   sodium chloride flush  3 mL Intravenous Q12H   Continuous Infusions:  sodium chloride     PRN Meds: sodium chloride, acetaminophen, alum & mag hydroxide-simeth, ondansetron (ZOFRAN) IV, mouth rinse, sodium chloride, sodium chloride flush   Vital Signs    Vitals:   01/19/22 1928 01/19/22 2306 01/20/22 0458 01/20/22 0505  BP: 119/76   116/76  Pulse: 84 84  72  Resp: '18 18  18  '$ Temp: 97.6 F (36.4 C)   97.7 F (36.5 C)  TempSrc: Oral   Oral  SpO2: 96% 96%  96%  Weight:   104.9 kg   Height:   6' (1.829 m)     Intake/Output Summary (Last 24 hours) at 01/20/2022 0742 Last data filed at 01/20/2022 0455 Gross per 24 hour  Intake 880 ml  Output 4050 ml  Net -3170 ml      01/20/2022    4:58 AM 01/19/2022    2:50 AM 01/18/2022    3:09 AM  Last 3 Weights  Weight (lbs) 231 lb 4.8 oz 236 lb 12.8 oz 242 lb 6.4 oz  Weight (kg) 104.917 kg 107.412 kg 109.952 kg      Telemetry    VP with PVCs; NSVT and VTACH are artifacts from pacing - Personally Reviewed  Physical Exam   Gen: no distress   Cardiac: No Rubs or Gallops, no murmur, IRIR rhythm Respiratory: Clear to auscultation bilaterally, great effort, normal   respiratory rate GI: Soft, nontender, distended with fluid wave MS: Non pitting edema;  moves all extremities Integument: Skin feels warm Neuro:  At time of evaluation, alert and oriented to person/place/time/situation  Psych: Normal affect, patient feels well   Labs    High Sensitivity Troponin:   Recent Labs  Lab 01/17/22 1147 01/17/22 1400  TROPONINIHS 34* 30*     Chemistry Recent Labs  Lab 01/16/22 1202 01/17/22 0514 01/18/22 0422 01/19/22 0544 01/20/22 0518  NA 142   < > 141 142 141  K 4.3   < > 3.8 4.1 4.2  CL 108   < > 104 105 102  CO2 24   < > '26 26 26  '$ GLUCOSE 191*   < > 116* 143* 142*  BUN 45*   < > 45* 49* 46*  CREATININE 2.04*   < > 2.05* 1.89* 1.68*  CALCIUM 9.9   < > 9.9 10.2 10.1  PROT 7.4  --   --   --   --   ALBUMIN 4.3  --   --   --   --  AST 18  --   --   --   --   ALT 17  --   --   --   --   ALKPHOS 77  --   --   --   --   BILITOT 1.1  --   --   --   --   GFRNONAA 32*   < > 32* 35* 40*  ANIONGAP 10   < > '11 11 13   '$ < > = values in this interval not displayed.     Hematology Recent Labs  Lab 01/16/22 1202 01/17/22 0514 01/20/22 0518  WBC 7.5 7.0 7.2  RBC 4.31 4.21* 4.80  HGB 12.5* 11.9* 13.6  HCT 39.9 39.0 43.8  MCV 92.6 92.6 91.3  MCH 29.0 28.3 28.3  MCHC 31.3 30.5 31.1  RDW 18.9* 18.7* 18.1*  PLT 133* 131* 185    BNP Recent Labs  Lab 01/15/22 1122 01/16/22 1202 01/17/22 0514  BNP  --  992.0* 969.2*  PROBNP 1,211.0*  --   --     DDimer  Recent Labs  Lab 01/16/22 1920  DDIMER 0.49     Radiology    No results found.  Patient Profile      81 year old male with past medical history of chronic combined systolic/diastolic congestive heart failure, ischemic cardiomyopathy, coronary artery disease status post coronary bypass and graft, history of TAVR, prior pacemaker with removal and MICRA in 2023, chronic stage III kidney disease, permanent atrial fibrillation, history of angioedema with ACE inhibitor, diabetes  mellitus, history of B-cell lymphoma for evaluation of acute on chronic combined systolic/diastolic congestive heart failure.  Most recent echocardiogram shows ejection fraction 30 to 35%, moderate left ventricular enlargement, grade 2 diastolic dysfunction, biatrial enlargement, mild to moderate tricuspid regurgitation, status post aortic valve replacement with no aortic stenosis or aortic insufficiency.  Assessment & Plan    Heart Failure Reduced Ejection Fraction  Chronic stage IIIa kidney disease Ischemic cardiomyopathy S/p TAVR without evidence of prosthetic valve dysfunction Complicated by AM leg cramps with diuresis - NYHA class II, Stage C, hypervolemic, etiology is likely multi-factorial - Diuretic regimen: will increase to 100 IV Lasix BID  - increase K supplementation, Mg level from tomorrow AM - MRA note started for AKI issues - continue SGLT2i   -- allergy to ACE inhibitors (angioedema); BP would not tolerate additional afterload reduction agents  Permanent atrial fibrillation Tachy brady s/p PPM with revision and s/p MICRA - CHADSVASC 5 - continue Toprol  - Continue apixaban.   Coronary artery disease status post coronary artery bypass HLD - Continue current meds    For questions or updates, please contact [place] Please consult [email or web address] for contact info under        Signed, Werner Lean, MD  01/20/2022, 7:42 AM

## 2022-01-21 ENCOUNTER — Ambulatory Visit: Payer: Medicare Other | Admitting: General Practice

## 2022-01-21 DIAGNOSIS — I10 Essential (primary) hypertension: Secondary | ICD-10-CM | POA: Diagnosis not present

## 2022-01-21 DIAGNOSIS — Z950 Presence of cardiac pacemaker: Secondary | ICD-10-CM | POA: Diagnosis not present

## 2022-01-21 DIAGNOSIS — E782 Mixed hyperlipidemia: Secondary | ICD-10-CM | POA: Diagnosis not present

## 2022-01-21 DIAGNOSIS — I4821 Permanent atrial fibrillation: Secondary | ICD-10-CM | POA: Diagnosis not present

## 2022-01-21 DIAGNOSIS — I5043 Acute on chronic combined systolic (congestive) and diastolic (congestive) heart failure: Secondary | ICD-10-CM | POA: Diagnosis not present

## 2022-01-21 DIAGNOSIS — N289 Disorder of kidney and ureter, unspecified: Secondary | ICD-10-CM

## 2022-01-21 DIAGNOSIS — G4733 Obstructive sleep apnea (adult) (pediatric): Secondary | ICD-10-CM | POA: Diagnosis not present

## 2022-01-21 LAB — BASIC METABOLIC PANEL
Anion gap: 11 (ref 5–15)
BUN: 57 mg/dL — ABNORMAL HIGH (ref 8–23)
CO2: 25 mmol/L (ref 22–32)
Calcium: 10.1 mg/dL (ref 8.9–10.3)
Chloride: 105 mmol/L (ref 98–111)
Creatinine, Ser: 1.91 mg/dL — ABNORMAL HIGH (ref 0.61–1.24)
GFR, Estimated: 34 mL/min — ABNORMAL LOW (ref >60)
Glucose, Bld: 142 mg/dL — ABNORMAL HIGH (ref 70–99)
Potassium: 4.5 mmol/L (ref 3.5–5.1)
Sodium: 141 mmol/L (ref 135–145)

## 2022-01-21 LAB — CBC WITH DIFFERENTIAL/PLATELET
Abs Immature Granulocytes: 0.03 10*3/uL (ref 0.00–0.07)
Basophils Absolute: 0.1 10*3/uL (ref 0.0–0.1)
Basophils Relative: 1 %
Eosinophils Absolute: 0.4 10*3/uL (ref 0.0–0.5)
Eosinophils Relative: 5 %
HCT: 42.4 % (ref 39.0–52.0)
Hemoglobin: 13.4 g/dL (ref 13.0–17.0)
Immature Granulocytes: 0 %
Lymphocytes Relative: 24 %
Lymphs Abs: 1.8 10*3/uL (ref 0.7–4.0)
MCH: 29 pg (ref 26.0–34.0)
MCHC: 31.6 g/dL (ref 30.0–36.0)
MCV: 91.8 fL (ref 80.0–100.0)
Monocytes Absolute: 0.8 10*3/uL (ref 0.1–1.0)
Monocytes Relative: 10 %
Neutro Abs: 4.5 10*3/uL (ref 1.7–7.7)
Neutrophils Relative %: 60 %
Platelets: 177 10*3/uL (ref 150–400)
RBC: 4.62 MIL/uL (ref 4.22–5.81)
RDW: 17.7 % — ABNORMAL HIGH (ref 11.5–15.5)
WBC: 7.6 10*3/uL (ref 4.0–10.5)
nRBC: 0 % (ref 0.0–0.2)

## 2022-01-21 LAB — MAGNESIUM: Magnesium: 2.6 mg/dL — ABNORMAL HIGH (ref 1.7–2.4)

## 2022-01-21 MED ORDER — TORSEMIDE 20 MG PO TABS
40.0000 mg | ORAL_TABLET | Freq: Every day | ORAL | Status: DC
  Administered 2022-01-22 – 2022-01-23 (×2): 40 mg via ORAL
  Filled 2022-01-21 (×2): qty 2

## 2022-01-21 MED ORDER — LACTULOSE 10 GM/15ML PO SOLN
20.0000 g | Freq: Three times a day (TID) | ORAL | Status: DC
  Administered 2022-01-21 – 2022-01-23 (×5): 20 g via ORAL
  Filled 2022-01-21 (×5): qty 30

## 2022-01-21 NOTE — Progress Notes (Signed)
Progress Note  Patient Name: Richard [name] Date of Encounter: 01/21/2022  [facility] Cardiologist: Minus Breeding, MD   Subjective   Pt feels well, slept nearly flat last night, LE swelling much improved, abdomen slightly distended. Has walked in the hall this morning  Inpatient Medications    Scheduled Meds:  allopurinol  450 mg Oral Daily   apixaban  2.5 mg Oral BID   atorvastatin  80 mg Oral QHS   dapagliflozin propanediol  10 mg Oral Daily   ezetimibe  10 mg Oral Daily   fenofibrate  160 mg Oral Daily   folic acid  2 mg Oral Daily   icosapent Ethyl  2 g Oral BID   metoprolol succinate  25 mg Oral Daily   potassium chloride SA  80 mEq Oral BID   pregabalin  150 mg Oral BID   psyllium  1 packet Oral BID   sodium chloride flush  3 mL Intravenous Q12H   Continuous Infusions:  sodium chloride     furosemide 100 mg (01/21/22 0232)   PRN Meds: sodium chloride, acetaminophen, alum & mag hydroxide-simeth, ondansetron (ZOFRAN) IV, mouth rinse, sodium chloride, sodium chloride flush   Vital Signs    Vitals:   01/20/22 0505 01/20/22 1100 01/21/22 0500 01/21/22 0900  BP: 116/76 96/78  119/70  Pulse: 72 77  76  Resp: '18 20  20  '$ Temp: 97.7 F (36.5 C) 97.7 F (36.5 C)    TempSrc: Oral Oral    SpO2: 96% 96%  96%  Weight:   105 kg   Height:        Intake/Output Summary (Last 24 hours) at 01/21/2022 1246 Last data filed at 01/21/2022 1000 Gross per 24 hour  Intake 1860 ml  Output 2360 ml  Net -500 ml      01/21/2022    5:00 AM 01/20/2022    4:58 AM 01/19/2022    2:50 AM  Last 3 Weights  Weight (lbs) 231 lb 8 oz 231 lb 4.8 oz 236 lb 12.8 oz  Weight (kg) 105.008 kg 104.917 kg 107.412 kg      Telemetry    Paced rhythm - Personally Reviewed  ECG    No new tracings - Personally Reviewed  Physical Exam   GEN: No acute distress.   Neck: minimal JVD Cardiac: RRR, no murmurs, rubs, or gallops.  Respiratory: Clear to auscultation bilaterally. GI: Soft,  nontender, mildly distended  MS: mild edema; chronic skin changes and prior wounds Neuro:  Nonfocal  Psych: Normal affect   Labs    High Sensitivity Troponin:   Recent Labs  Lab 01/17/22 1147 01/17/22 1400  TROPONINIHS 34* 30*     Chemistry Recent Labs  Lab 01/16/22 1202 01/17/22 0514 01/19/22 0544 01/20/22 0518 01/21/22 0453  NA 142   < > 142 141 141  K 4.3   < > 4.1 4.2 4.5  CL 108   < > 105 102 105  CO2 24   < > '26 26 25  '$ GLUCOSE 191*   < > 143* 142* 142*  BUN 45*   < > 49* 46* 57*  CREATININE 2.04*   < > 1.89* 1.68* 1.91*  CALCIUM 9.9   < > 10.2 10.1 10.1  MG  --   --   --   --  2.6*  PROT 7.4  --   --   --   --   ALBUMIN 4.3  --   --   --   --  AST 18  --   --   --   --   ALT 17  --   --   --   --   ALKPHOS 77  --   --   --   --   BILITOT 1.1  --   --   --   --   GFRNONAA 32*   < > 35* 40* 34*  ANIONGAP 10   < > '11 13 11   '$ < > = values in this interval not displayed.    Lipids No results for input(s): "CHOL", "TRIG", "HDL", "LABVLDL", "LDLCALC", "CHOLHDL" in the last 168 hours.  Hematology Recent Labs  Lab 01/17/22 0514 01/20/22 0518 01/21/22 0453  WBC 7.0 7.2 7.6  RBC 4.21* 4.80 4.62  HGB 11.9* 13.6 13.4  HCT 39.0 43.8 42.4  MCV 92.6 91.3 91.8  MCH 28.3 28.3 29.0  MCHC 30.5 31.1 31.6  RDW 18.7* 18.1* 17.7*  PLT 131* 185 177   Thyroid No results for input(s): "TSH", "FREET4" in the last 168 hours.  BNP Recent Labs  Lab 01/15/22 1122 01/16/22 1202 01/17/22 0514  BNP  --  992.0* 969.2*  PROBNP 1,211.0*  --   --     DDimer  Recent Labs  Lab 01/16/22 1920  DDIMER 0.49     Radiology    No results found.  Cardiac Studies   Echo 01/17/22: 1. Left ventricular ejection fraction, by estimation, is 30 to 35%. The  left ventricle has moderately decreased function. The left ventricle  demonstrates regional wall motion abnormalities (see scoring  diagram/findings for description). The left  ventricular internal cavity size was moderately  dilated. Left ventricular  diastolic parameters are consistent with Grade II diastolic dysfunction  (pseudonormalization). Elevated left ventricular end-diastolic pressure.   2. Right ventricular systolic function is normal. The right ventricular  size is normal. There is mildly elevated pulmonary artery systolic  pressure.   3. Left atrial size was mildly dilated.   4. Right atrial size was severely dilated.   5. The mitral valve is normal in structure. Trivial mitral valve  regurgitation. No evidence of mitral stenosis. Moderate mitral annular  calcification.   6. Tricuspid valve regurgitation is mild to moderate.   7. The aortic valve has been repaired/replaced. Aortic valve  regurgitation is not visualized. No aortic stenosis is present. There is a  26 mm Sapien prosthetic (TAVR) valve present in the aortic position.   8. Aortic dilatation noted. There is borderline dilatation of the  ascending aorta, measuring 36 mm.   9. The inferior vena cava is dilated in size with <50% respiratory  variability, suggesting right atrial pressure of 15 mmHg.   Patient Profile     81 y.o. male with a complex PMH of systolic and diastolic heart failure, CAD s/p CABG x5 2001, aortic stenosis s/p TAVR, CHB following TAVR s/p left sided subclavian PPM 08/2017,  left subclavian occlusion s/p PPM extraction with conversion to [place] pacemaker 08/20/21 at [facility], HTN,  permanent atrial fibrillation/flutter, severe angioedema with ACEi, AAA, dyslipidemia, OSA on CPAP, CKD III, type II DM, GI bleed 08/2017 from hemorrhoids, Diffuse large B-cell lymphoma s/p chemotherapy 2019, pernicious anemia, who is was seen for the evaluation of CHF exacerbation.  Assessment & Plan    Chronic systolic heart failure  Acute on Chronic stage IIIa kidney disease Ischemic cardiomyopathy S/p TAVR without evidence of prosthetic valve dysfunction - leg cramping yesterday, no further cramping today - diuretic  regimen increased to 100 mg IV lasix BID yesterday - is now 8 L net negative with 2.5 L urine output yesterday - weight is down 18 lbs - but stable overnight - GDMT limited by angiodema with ACEI, marginal BP and renal function - continue farxiga - sCr 1.91 - baseline near 1.5-1.6 - K 4.5 - nearing euvolemia - will stay on IV lasix today and transition back to torsemide tomorrow morning - home dose of torsemide was 20 mg, increased to 40 mg prior to admission - suspect he may need to stay on 40 mg torsemide until follow up in August - defer MRA to primary cardiologist and nephrology   Permanent atrial fibrillation  SSS s/p PPM with revision to leadless Micra (08/2021) Continue BB and eliquis - appropriate dose of eliquis given age and renal function   CAD s/p CABG  Hyperlipidemia No chest pain No ASA given eliquis Continue BB, lipitor, zetia, fenofibrate, and vascepa 07/26/2021: Cholesterol 168; HDL 32.40; Triglycerides 690.0 Triglyceride is over 400; calculations on Lipids are invalid. - may consider PCSK9i in the outpatient setting      For questions or updates, please contact [place] Please consult [email or web address] for contact info under        Signed, Ledora Bottcher, PA  01/21/2022, 12:46 PM

## 2022-01-21 NOTE — Progress Notes (Signed)
PROGRESS NOTE  Richard [name] ZOX:[id number] DOB: Dec 01, 1940 DOA: 01/16/2022 PCP: [name], DO  Brief History   Richard [name] is a 81 y.o. male with medical history significant of CHF, Bradycardia (pacemaker), Diffuse large B cell lymphoma (s/p chemo), Esophageal stricture, GERD, gout, HTN, Hyperlipidemia, OSA on CPAP, squamous cell carcinoma of skin, Severe aortic stenosis, DM II.    The patient states that he has been having increasing dyspnea with exertion for the past 3-6 months. He states that several months ago a nephrologist took him off of his lasix and switched him to Torsemide stating that the Lasix was going to burn up his kidneys. This dose of torsemide was continued until last week when the dose was increased by his cardiologist. The patient lost 5 lbs after increased dose. However he continue to become increasingly short of breath with exertion. The patient underwent an echocardiogram and a nuclear medicine stress that demonstrated an EF of 30-35%, but no ischemia, although it is documented that the patient is felt to be high risk for ischemia. Echocardiogram also demonstrated EF of 30%. Repeat echocardiogram is unchanged, but does demonstrated wall motion abnormalities.   The patient followed up with pulmonology this week due to his ongoing shortness of breath. CXR demonstrated pulmonary vascular congestion with some pleural effusion. At this point his diuretic had been increased. Labs were drawn. The patient's creatinine (baseline appears to be 1.3 - 1.5 is now 2.11. His BNP was greater than 1200 yesterday, although today it is down to 998.   Upon further questioning the patient denies any chest pain. He does admit to orthopnea, lower extremity swelling (right leg greater than left), and hemoptysis. No nausea, vomiting, constipation, diarrhea, hematemesis, melena, coffee ground emesis, or melena. No fevers or chills.    In the ED the patient is found to have no change in  the appearance of his pulmonary vascular congestion. Vital signs are stable, and he is saturating in the low to mid 90's on room air. EKG is unchanged from previous. As previously stated the patient's creatinine has increased to 2.11 with a EGFR of 32. Glucose is 191. BNP os 992, and platelets are 133.    The patient has received 60 mg IV lasix in the ED.   D Dimer, VQ scan and Doppler of the right lower extremity have been ordered and are pending.   The patient has been admitted to a telemetry bed. Cardiology has been consulted and the patient has been continued on lasix 100 mg IV bid.   The patient had severe leg cramps on the morning of 01/20/2022.  He is maintaining a negative fluid balance.  Consultants  Cardiology Advanced heart failure team  Procedures  None  Antibiotics   Anti-infectives (From admission, onward)    None      Subjective  The patient is resting comfortably. No new complaints.   Objective   Vitals:  Vitals:   01/21/22 0900 01/21/22 1429  BP: 119/70 107/70  Pulse: 76 60  Resp: 20 18  Temp:  (!) 97.5 F (36.4 C)  SpO2: 96% 95%    Exam:  Constitutional:  The patient is awake, alert, and oriented x 3. No acute distress. Respiratory:  No increased work of breathing. No wheezes, rales, or rhonchi No tactile fremitus Cardiovascular:  Regular rate and rhythm No murmurs, ectopy, or gallups. No lateral PMI. No thrills. Abdomen:  Abdomen is soft, non-tender, non-distended No hernias, masses, or organomegaly Normoactive bowel sounds.  Musculoskeletal:  Edema of lower extremities bilaterally are greatly improved. Still with 1+ pitting edema bilaterally. Skin:  No rashes, lesions, ulcers palpation of skin: no induration or nodules Neurologic:  CN 2-12 intact Sensation all 4 extremities intact Psychiatric:  Mental status Mood, affect appropriate Orientation to person, place, time  judgment and insight appear intact  I have personally  reviewed the following:   Today's Data  Vitals  Lab Data  CBC BMP  Micro Data    Imaging  CXR  Cardiology Data  EKG Echocardiogram  Other Data    Scheduled Meds:  allopurinol  450 mg Oral Daily   apixaban  2.5 mg Oral BID   atorvastatin  80 mg Oral QHS   dapagliflozin propanediol  10 mg Oral Daily   ezetimibe  10 mg Oral Daily   fenofibrate  160 mg Oral Daily   folic acid  2 mg Oral Daily   icosapent Ethyl  2 g Oral BID   lactulose  20 g Oral TID   metoprolol succinate  25 mg Oral Daily   potassium chloride SA  80 mEq Oral BID   pregabalin  150 mg Oral BID   psyllium  1 packet Oral BID   sodium chloride flush  3 mL Intravenous Q12H   [START ON 01/22/2022] torsemide  40 mg Oral Daily   Continuous Infusions:  sodium chloride      Principal Problem:   Acute exacerbation of CHF (congestive heart failure) (HCC) Active Problems:   Essential hypertension   OSA (obstructive sleep apnea)   Anemia, iron deficiency   Aortic stenosis, severe   Uncontrolled type 2 diabetes mellitus with hyperglycemia (HCC)   Cardiac pacemaker in situ   Hemoptysis   LOS: 5 days   A & P   Assessment and Plan: * Acute exacerbation of CHF (congestive heart failure) ([place]) Echo performed roughly 1 month ago demonstrated EF of 30%. Repeat echo ordered due to worsening of symptoms despite diuresis. It is not much changed.The patient has been admitted to a telemetry bed. Cardiology and the advance heart failure team have been consulted. Will monitor troponins. EKG demonstrates a paced rhythm due to pacemaker placed due to complete heart block. He received 60 mg IV lasix in the ED.   Monitor strict I's and O's. He is receiving lasix 100 mg IV BID. Cardiology plans to begin to transition him to torsemide tomorrow. Will continue to monitor creatinine and electrolytes. He is maintaining strong negative fluid balance. I appreciate Cardiology's assistance.   No ACE due to the patient's history of  angioedema.   Hemoptysis Likely secondary to CHF. VQ scan negative for PE. Now resolved with aggressive treatment of CHF.   Cardiac pacemaker in situ Noted.   Uncontrolled type 2 diabetes mellitus with hyperglycemia (HCC) Glucoses will be followed with FSBS and SSI.  Aortic stenosis, severe S/P TAVR. Stable.  Anemia, iron deficiency Hemoglobin is stable at 13.4. Continue iron supplementation and to monitor hemoglobin.  OSA (obstructive sleep apnea) Noted. Patient uses CPAP.  Essential hypertension Blood pressures are normotensive on metoprolol. No ACE as the patient's blood pressure and creatinine would not tolerate it, and the patient has a history of angioedema.    I have seen and examined this patient myself. I have spent 32 minutes in his evaluation and care.  DVT prophylaxis: Eliquis Code Status: Full Code Family Communication: None available Disposition Plan: Home   Teigen Bellin, DO [facility] Direct contact: see [email or web address]  7PM-7AM contact night  coverage as above 01/21/2022, 5:33 PM  LOS: 2 days

## 2022-01-22 ENCOUNTER — Ambulatory Visit: Payer: Medicare Other | Admitting: Nurse Practitioner

## 2022-01-22 ENCOUNTER — Ambulatory Visit: Payer: Medicare Other | Admitting: Podiatry

## 2022-01-22 DIAGNOSIS — I5033 Acute on chronic diastolic (congestive) heart failure: Secondary | ICD-10-CM | POA: Diagnosis not present

## 2022-01-22 DIAGNOSIS — I350 Nonrheumatic aortic (valve) stenosis: Secondary | ICD-10-CM | POA: Diagnosis not present

## 2022-01-22 LAB — CBC WITH DIFFERENTIAL/PLATELET
Abs Immature Granulocytes: 0.02 10*3/uL (ref 0.00–0.07)
Basophils Absolute: 0.1 10*3/uL (ref 0.0–0.1)
Basophils Relative: 1 %
Eosinophils Absolute: 0.4 10*3/uL (ref 0.0–0.5)
Eosinophils Relative: 5 %
HCT: 43.9 % (ref 39.0–52.0)
Hemoglobin: 13.6 g/dL (ref 13.0–17.0)
Immature Granulocytes: 0 %
Lymphocytes Relative: 24 %
Lymphs Abs: 1.9 10*3/uL (ref 0.7–4.0)
MCH: 28.3 pg (ref 26.0–34.0)
MCHC: 31 g/dL (ref 30.0–36.0)
MCV: 91.5 fL (ref 80.0–100.0)
Monocytes Absolute: 0.8 10*3/uL (ref 0.1–1.0)
Monocytes Relative: 9 %
Neutro Abs: 5 10*3/uL (ref 1.7–7.7)
Neutrophils Relative %: 61 %
Platelets: 212 10*3/uL (ref 150–400)
RBC: 4.8 MIL/uL (ref 4.22–5.81)
RDW: 17.7 % — ABNORMAL HIGH (ref 11.5–15.5)
WBC: 8.2 10*3/uL (ref 4.0–10.5)
nRBC: 0 % (ref 0.0–0.2)

## 2022-01-22 LAB — BASIC METABOLIC PANEL
Anion gap: 12 (ref 5–15)
BUN: 61 mg/dL — ABNORMAL HIGH (ref 8–23)
CO2: 21 mmol/L — ABNORMAL LOW (ref 22–32)
Calcium: 9.6 mg/dL (ref 8.9–10.3)
Chloride: 106 mmol/L (ref 98–111)
Creatinine, Ser: 1.87 mg/dL — ABNORMAL HIGH (ref 0.61–1.24)
GFR, Estimated: 35 mL/min — ABNORMAL LOW (ref >60)
Glucose, Bld: 150 mg/dL — ABNORMAL HIGH (ref 70–99)
Potassium: 4.5 mmol/L (ref 3.5–5.1)
Sodium: 139 mmol/L (ref 135–145)

## 2022-01-22 LAB — MAGNESIUM: Magnesium: 2.6 mg/dL — ABNORMAL HIGH (ref 1.7–2.4)

## 2022-01-22 MED ORDER — POTASSIUM CHLORIDE CRYS ER 20 MEQ PO TBCR
40.0000 meq | EXTENDED_RELEASE_TABLET | Freq: Every day | ORAL | Status: DC
  Administered 2022-01-23: 40 meq via ORAL
  Filled 2022-01-22: qty 2

## 2022-01-22 NOTE — Plan of Care (Signed)
  Problem: Clinical Measurements: Goal: Ability to maintain clinical measurements within normal limits will improve Outcome: Progressing Goal: Will remain free from infection Outcome: Progressing Goal: Diagnostic test results will improve Outcome: Progressing Goal: Cardiovascular complication will be avoided Outcome: Progressing   Problem: Elimination: Goal: Will not experience complications related to bowel motility Outcome: Progressing Goal: Will not experience complications related to urinary retention Outcome: Progressing   Problem: Safety: Goal: Ability to remain free from injury will improve Outcome: Progressing   Problem: Skin Integrity: Goal: Risk for impaired skin integrity will decrease Outcome: Progressing   Problem: Education: Goal: Ability to demonstrate management of disease process will improve Outcome: Progressing Goal: Ability to verbalize understanding of medication therapies will improve Outcome: Progressing   Problem: Activity: Goal: Capacity to carry out activities will improve Outcome: Progressing   Problem: Cardiac: Goal: Ability to achieve and maintain adequate cardiopulmonary perfusion will improve Outcome: Progressing   Problem: Education: Goal: Individualized Educational Video(s) Outcome: Not Applicable

## 2022-01-22 NOTE — Progress Notes (Addendum)
PROGRESS NOTE  Richard [name] ZES:[id number] DOB: 21-Nov-1940 DOA: 01/16/2022 PCP: [name], DO  HPI/Recap of past 24 hours:  Richard [name] is a 81 y.o. male with medical history significant of CHF, Bradycardia (pacemaker), Diffuse large B cell lymphoma (s/p chemo), Esophageal stricture, GERD, gout, HTN, Hyperlipidemia, OSA on CPAP, squamous cell carcinoma of skin, Severe aortic stenosis, DM II.    The patient states that he has been having increasing dyspnea with exertion for the past 3-6 months. He states that several months ago a nephrologist took him off of his lasix and switched him to Torsemide stating that the Lasix was going to burn up his kidneys. This dose of torsemide was continued until last week when the dose was increased by his cardiologist. The patient lost 5 lbs after increased dose. However he continue to become increasingly short of breath with exertion. The patient underwent an echocardiogram and a nuclear medicine stress that demonstrated an EF of 30-35%, but no ischemia, although it is documented that the patient is felt to be high risk for ischemia. Echocardiogram also demonstrated EF of 30%. Repeat echocardiogram is unchanged, but does demonstrated wall motion abnormalities.   The patient followed up with pulmonology this week due to his ongoing shortness of breath. CXR demonstrated pulmonary vascular congestion with some pleural effusion. At this point his diuretic had been increased. Labs were drawn. The patient's creatinine (baseline appears to be 1.3 - 1.5 is now 2.11. His BNP was greater than 1200 yesterday, although today it is down to 998.   Upon further questioning the patient denies any chest pain. He does admit to orthopnea, lower extremity swelling (right leg greater than left), and hemoptysis. No nausea, vomiting, constipation, diarrhea, hematemesis, melena, coffee ground emesis, or melena. No fevers or chills.    In the ED the patient is found to have  no change in the appearance of his pulmonary vascular congestion. Vital signs are stable, and he is saturating in the low to mid 90's on room air. EKG is unchanged from previous. As previously stated the patient's creatinine has increased to 2.11 with a EGFR of 32. Glucose is 191. BNP os 992, and platelets are 133.    The patient has received 60 mg IV lasix in the ED.   D Dimer, VQ scan and Doppler of the right lower extremity have been ordered and are pending.   The patient has been admitted to a telemetry bed. Cardiology has been consulted and the patient has been continued on lasix 100 mg IV bid.    The patient had severe leg cramps on the morning of 01/20/2022.   He is maintaining a negative fluid balance.  01/22/22:  Seen and examined at his bedside.  States he feels better with negative fluid balance.  Still volume overloaded on exam.  Assessment/Plan: Principal Problem:   Acute exacerbation of CHF (congestive heart failure) (HCC) Active Problems:   Essential hypertension   OSA (obstructive sleep apnea)   Anemia, iron deficiency   Aortic stenosis, severe   Uncontrolled type 2 diabetes mellitus with hyperglycemia (HCC)   Cardiac pacemaker in situ   Hemoptysis  *Acute on chronic combined diastolic and systolic CHF.   Last 2D echo done on 01/17/2022 revealed LVEF 30 to 35% with grade 2 diastolic dysfunction.  He received 60 mg IV lasix in the ED.  He is currently on torsemide 40 mg daily. Continue to monitor strict I's and O's and daily weight. Net I&O -9.7  L.   No ACE due to the patient's history of angioedema.    Resolved hemoptysis Likely secondary to CHF. VQ scan negative for PE. Resolved with aggressive treatment of CHF.    Cardiac pacemaker in situ No acute issues   Uncontrolled type 2 diabetes mellitus with hyperglycemia (HCC) Glucoses will be followed with FSBS and SSI.   Aortic stenosis, severe S/P TAVR. Stable.   Anemia, iron deficiency Hemoglobin is stable  at 13.4. Continue iron supplementation and to monitor hemoglobin.   OSA (obstructive sleep apnea) Noted. Patient uses CPAP.   Essential hypertension Blood pressures are normotensive on metoprolol. No ACE as the patient's blood pressure and creatinine would not tolerate it, and the patient has a history of angioedema.       DVT prophylaxis: Eliquis Code Status: Full Code Family Communication: None available Disposition Plan: Home possibly tomorrow      Status is: Inpatient The patient requires at least 2 midnights for further evaluation and management of present condition.    Objective: Vitals:   01/22/22 0436 01/22/22 0500 01/22/22 1206 01/22/22 1224  BP: 123/80  92/81   Pulse: 63  83   Resp: 20  18   Temp: (!) 97.5 F (36.4 C)   (!) 97.4 F (36.3 C)  TempSrc: Oral   Oral  SpO2: 95%  92%   Weight:  104 kg    Height:        Intake/Output Summary (Last 24 hours) at 01/22/2022 1626 Last data filed at 01/22/2022 1225 Gross per 24 hour  Intake 420 ml  Output 1650 ml  Net -1230 ml   Filed Weights   01/20/22 0458 01/21/22 0500 01/22/22 0500  Weight: 104.9 kg 105 kg 104 kg    Exam:  General: 81 y.o. year-old male well developed well nourished in no acute distress.  Alert and oriented x3. Cardiovascular: Regular rate and rhythm with no rubs or gallops.  No thyromegaly or JVD noted.   Respiratory: Clear to auscultation with no wheezes or rales. Good inspiratory effort. Abdomen: Soft nontender nondistended with normal bowel sounds x4 quadrants. Musculoskeletal: Trace lower extremity edema bilaterally. Skin: No ulcerative lesions noted or rashes. Psychiatry: Mood is appropriate for condition and setting   Data Reviewed: CBC: Recent Labs  Lab 01/16/22 1202 01/17/22 0514 01/20/22 0518 01/21/22 0453 01/22/22 0649  WBC 7.5 7.0 7.2 7.6 8.2  NEUTROABS 5.7 4.6 4.5 4.5 5.0  HGB 12.5* 11.9* 13.6 13.4 13.6  HCT 39.9 39.0 43.8 42.4 43.9  MCV 92.6 92.6 91.3 91.8 91.5   PLT 133* 131* 185 177 263   Basic Metabolic Panel: Recent Labs  Lab 01/18/22 0422 01/19/22 0544 01/20/22 0518 01/21/22 0453 01/22/22 0445 01/22/22 0649  NA 141 142 141 141  --  139  K 3.8 4.1 4.2 4.5  --  4.5  CL 104 105 102 105  --  106  CO2 26 26 26 25   --  21*  GLUCOSE 116* 143* 142* 142*  --  150*  BUN 45* 49* 46* 57*  --  61*  CREATININE 2.05* 1.89* 1.68* 1.91*  --  1.87*  CALCIUM 9.9 10.2 10.1 10.1  --  9.6  MG  --   --   --  2.6* 2.6*  --    GFR: Estimated Creatinine Clearance: 37.3 mL/min (A) (by C-G formula based on SCr of 1.87 mg/dL (H)). Liver Function Tests: Recent Labs  Lab 01/16/22 1202  AST 18  ALT 17  ALKPHOS 77  BILITOT 1.1  PROT 7.4  ALBUMIN 4.3   No results for input(s): "LIPASE", "AMYLASE" in the last 168 hours. No results for input(s): "AMMONIA" in the last 168 hours. Coagulation Profile: No results for input(s): "INR", "PROTIME" in the last 168 hours. Cardiac Enzymes: No results for input(s): "CKTOTAL", "CKMB", "CKMBINDEX", "TROPONINI" in the last 168 hours. BNP (last 3 results) Recent Labs    11/29/21 1401 01/15/22 1122  PROBNP 5,385* 1,211.0*   HbA1C: No results for input(s): "HGBA1C" in the last 72 hours. CBG: Recent Labs  Lab 01/20/22 0342  GLUCAP 147*   Lipid Profile: No results for input(s): "CHOL", "HDL", "LDLCALC", "TRIG", "CHOLHDL", "LDLDIRECT" in the last 72 hours. Thyroid Function Tests: No results for input(s): "TSH", "T4TOTAL", "FREET4", "T3FREE", "THYROIDAB" in the last 72 hours. Anemia Panel: No results for input(s): "VITAMINB12", "FOLATE", "FERRITIN", "TIBC", "IRON", "RETICCTPCT" in the last 72 hours. Urine analysis:    Component Value Date/Time   COLORURINE YELLOW 09/05/2017 1400   APPEARANCEUR CLEAR 09/05/2017 1400   LABSPEC 1.010 09/05/2017 1400   PHURINE 5.0 09/05/2017 1400   GLUCOSEU NEGATIVE 09/05/2017 1400   HGBUR NEGATIVE 09/05/2017 1400   HGBUR negative 05/29/2010 1100   BILIRUBINUR negative  08/10/2019 1343   KETONESUR NEGATIVE 09/05/2017 1400   PROTEINUR Negative 08/10/2019 1343   PROTEINUR NEGATIVE 09/05/2017 1400   UROBILINOGEN 0.2 08/10/2019 1343   UROBILINOGEN 0.2 10/06/2014 1010   NITRITE negative 08/10/2019 1343   NITRITE NEGATIVE 09/05/2017 1400   LEUKOCYTESUR Negative 08/10/2019 1343   Sepsis Labs: @LABRCNTIP (procalcitonin:4,lacticidven:4)  )No results found for this or any previous visit (from the past 240 hour(s)).    Studies: No results found.  Scheduled Meds:  allopurinol  450 mg Oral Daily   apixaban  2.5 mg Oral BID   atorvastatin  80 mg Oral QHS   dapagliflozin propanediol  10 mg Oral Daily   ezetimibe  10 mg Oral Daily   fenofibrate  160 mg Oral Daily   folic acid  2 mg Oral Daily   icosapent Ethyl  2 g Oral BID   lactulose  20 g Oral TID   metoprolol succinate  25 mg Oral Daily   [START ON 01/23/2022] potassium chloride SA  40 mEq Oral Daily   pregabalin  150 mg Oral BID   psyllium  1 packet Oral BID   sodium chloride flush  3 mL Intravenous Q12H   torsemide  40 mg Oral Daily    Continuous Infusions:  sodium chloride       LOS: 6 days     Kayleen Memos, MD [facility] Pager [phone]  If 7PM-7AM, please contact night-coverage [email or web address] Password [facility] 01/22/2022, 4:26 PM

## 2022-01-22 NOTE — Progress Notes (Signed)
Progress Note  Patient Name: Richard [name] Date of Encounter: 01/22/2022  [facility] Cardiologist: Minus Breeding, MD   Subjective   Found sitting up in chair, feels well, breathing well.  Inpatient Medications    Scheduled Meds:  allopurinol  450 mg Oral Daily   apixaban  2.5 mg Oral BID   atorvastatin  80 mg Oral QHS   dapagliflozin propanediol  10 mg Oral Daily   ezetimibe  10 mg Oral Daily   fenofibrate  160 mg Oral Daily   folic acid  2 mg Oral Daily   icosapent Ethyl  2 g Oral BID   lactulose  20 g Oral TID   metoprolol succinate  25 mg Oral Daily   potassium chloride SA  80 mEq Oral BID   pregabalin  150 mg Oral BID   psyllium  1 packet Oral BID   sodium chloride flush  3 mL Intravenous Q12H   torsemide  40 mg Oral Daily   Continuous Infusions:  sodium chloride     PRN Meds: sodium chloride, acetaminophen, alum & mag hydroxide-simeth, ondansetron (ZOFRAN) IV, mouth rinse, sodium chloride, sodium chloride flush   Vital Signs    Vitals:   01/21/22 1429 01/21/22 1942 01/22/22 0436 01/22/22 0500  BP: 107/70 119/81 123/80   Pulse: 60 85 63   Resp: '18 18 20   '$ Temp: (!) 97.5 F (36.4 C) 97.6 F (36.4 C) (!) 97.5 F (36.4 C)   TempSrc: Oral Oral Oral   SpO2: 95% 98% 95%   Weight:    104 kg  Height:        Intake/Output Summary (Last 24 hours) at 01/22/2022 1205 Last data filed at 01/22/2022 0826 Gross per 24 hour  Intake 300 ml  Output 2125 ml  Net -1825 ml      01/22/2022    5:00 AM 01/21/2022    5:00 AM 01/20/2022    4:58 AM  Last 3 Weights  Weight (lbs) 229 lb 4.5 oz 231 lb 8 oz 231 lb 4.8 oz  Weight (kg) 104 kg 105.008 kg 104.917 kg      Telemetry    Paced rhythm - Personally Reviewed  ECG    No new tracings - Personally Reviewed  Physical Exam   GEN: No acute distress.   Neck: No JVD Cardiac: RRR, no murmurs, rubs, or gallops.  Respiratory: Clear to auscultation bilaterally. GI: Soft, nontender, non-distended  MS: mild B LE  edema with chronic skin changes Neuro:  Nonfocal  Psych: Normal affect   Labs    High Sensitivity Troponin:   Recent Labs  Lab 01/17/22 1147 01/17/22 1400  TROPONINIHS 34* 30*     Chemistry Recent Labs  Lab 01/16/22 1202 01/17/22 0514 01/20/22 0518 01/21/22 0453 01/22/22 0445 01/22/22 0649  NA 142   < > 141 141  --  139  K 4.3   < > 4.2 4.5  --  4.5  CL 108   < > 102 105  --  106  CO2 24   < > 26 25  --  21*  GLUCOSE 191*   < > 142* 142*  --  150*  BUN 45*   < > 46* 57*  --  61*  CREATININE 2.04*   < > 1.68* 1.91*  --  1.87*  CALCIUM 9.9   < > 10.1 10.1  --  9.6  MG  --   --   --  2.6* 2.6*  --  PROT 7.4  --   --   --   --   --   ALBUMIN 4.3  --   --   --   --   --   AST 18  --   --   --   --   --   ALT 17  --   --   --   --   --   ALKPHOS 77  --   --   --   --   --   BILITOT 1.1  --   --   --   --   --   GFRNONAA 32*   < > 40* 34*  --  35*  ANIONGAP 10   < > 13 11  --  12   < > = values in this interval not displayed.    Lipids No results for input(s): "CHOL", "TRIG", "HDL", "LABVLDL", "LDLCALC", "CHOLHDL" in the last 168 hours.  Hematology Recent Labs  Lab 01/20/22 0518 01/21/22 0453 01/22/22 0649  WBC 7.2 7.6 8.2  RBC 4.80 4.62 4.80  HGB 13.6 13.4 13.6  HCT 43.8 42.4 43.9  MCV 91.3 91.8 91.5  MCH 28.3 29.0 28.3  MCHC 31.1 31.6 31.0  RDW 18.1* 17.7* 17.7*  PLT 185 177 212   Thyroid No results for input(s): "TSH", "FREET4" in the last 168 hours.  BNP Recent Labs  Lab 01/16/22 1202 01/17/22 0514  BNP 992.0* 969.2*    DDimer  Recent Labs  Lab 01/16/22 1920  DDIMER 0.49     Radiology    No results found.  Cardiac Studies   Echo 01/17/22: 1. Left ventricular ejection fraction, by estimation, is 30 to 35%. The  left ventricle has moderately decreased function. The left ventricle  demonstrates regional wall motion abnormalities (see scoring  diagram/findings for description). The left  ventricular internal cavity size was moderately  dilated. Left ventricular  diastolic parameters are consistent with Grade II diastolic dysfunction  (pseudonormalization). Elevated left ventricular end-diastolic pressure.   2. Right ventricular systolic function is normal. The right ventricular  size is normal. There is mildly elevated pulmonary artery systolic  pressure.   3. Left atrial size was mildly dilated.   4. Right atrial size was severely dilated.   5. The mitral valve is normal in structure. Trivial mitral valve  regurgitation. No evidence of mitral stenosis. Moderate mitral annular  calcification.   6. Tricuspid valve regurgitation is mild to moderate.   7. The aortic valve has been repaired/replaced. Aortic valve  regurgitation is not visualized. No aortic stenosis is present. There is a  26 mm Sapien prosthetic (TAVR) valve present in the aortic position.   8. Aortic dilatation noted. There is borderline dilatation of the  ascending aorta, measuring 36 mm.   9. The inferior vena cava is dilated in size with <50% respiratory  variability, suggesting right atrial pressure of 15 mmHg.     Patient Profile     81 y.o. male with a complex PMH of systolic and diastolic heart failure, CAD s/p CABG x5 2001, aortic stenosis s/p TAVR, CHB following TAVR s/p left sided subclavian PPM 08/2017,  left subclavian occlusion s/p PPM extraction with conversion to [place] pacemaker 08/20/21 at [facility], HTN,  permanent atrial fibrillation/flutter, severe angioedema with ACEi, AAA, dyslipidemia, OSA on CPAP, CKD III, type II DM, GI bleed 08/2017 from hemorrhoids, Diffuse large B-cell lymphoma s/p chemotherapy 2019, pernicious anemia, who is was seen for the evaluation of  CHF exacerbation.  Assessment & Plan    Chronic systolic heart failure  Acute on Chronic stage IIIa kidney disease Ischemic cardiomyopathy S/p TAVR without evidence of prosthetic valve dysfunction - transitioned to 40 mg torsemide yesterday (home dose was  20 mg) - GDMT limited by renal functio and angiodema with ACEI - continue farxiga and BB - renal function continues to improve - 1.87 (baseline 1.5-1.6)   Permanent atrial fibrillation  SSS s/p PPM with revision to leadless Micra (08/2021) Continue BB and eliquis - appropriate dose of eliquis given age and renal function     CAD s/p CABG  Hyperlipidemia No chest pain No ASA given eliquis Continue BB, lipitor, zetia, fenofibrate, and vascepa 07/26/2021: Cholesterol 168; HDL 32.40; Triglycerides 690.0 Triglyceride is over 400; calculations on Lipids are invalid. - may consider PCSK9i in the outpatient setting   Pt has appt already scheduled with Dr. Percival Spanish in 2 weeks.      For questions or updates, please contact [place] Please consult [email or web address] for contact info under        Signed, Ledora Bottcher, PA  01/22/2022, 12:05 PM

## 2022-01-22 NOTE — Progress Notes (Signed)
PT using own CPAP equipment

## 2022-01-22 NOTE — Care Management Important Message (Signed)
Important Message  Patient Details IM Letter placed in Patients room. Name: Richard [name] MRN: [id number] Date of Birth: [birth year]   Medicare Important Message Given:  Yes     Kerin Salen 01/22/2022, 3:44 PM

## 2022-01-22 NOTE — Progress Notes (Signed)
Overall, everything is about the same.  I will give his chemotherapy records.  His total dose of Adriamycin was 400 mg per metered squared.  This is below the typically recognized level of 450 mg per metered squared that can lead to congestive heart failure.  I still realize that the Adriamycin could be playing a role with him in the congestive heart failure.  Again there is no way to really prove this I think less you get an endomyocardial biopsy.  His blood counts are looking okay.  He is not anemic.  Hopefully, he will be able to go home soon.  I know that cardiology is working real hard and trying to adjust his medications.   Lattie Haw, MD  Romans 1:16

## 2022-01-23 ENCOUNTER — Other Ambulatory Visit: Payer: Self-pay | Admitting: Physician Assistant

## 2022-01-23 DIAGNOSIS — I5023 Acute on chronic systolic (congestive) heart failure: Secondary | ICD-10-CM | POA: Diagnosis not present

## 2022-01-23 DIAGNOSIS — N179 Acute kidney failure, unspecified: Secondary | ICD-10-CM | POA: Diagnosis not present

## 2022-01-23 DIAGNOSIS — I2510 Atherosclerotic heart disease of native coronary artery without angina pectoris: Secondary | ICD-10-CM

## 2022-01-23 DIAGNOSIS — I350 Nonrheumatic aortic (valve) stenosis: Secondary | ICD-10-CM | POA: Diagnosis not present

## 2022-01-23 DIAGNOSIS — I5033 Acute on chronic diastolic (congestive) heart failure: Secondary | ICD-10-CM | POA: Diagnosis not present

## 2022-01-23 DIAGNOSIS — I5043 Acute on chronic combined systolic (congestive) and diastolic (congestive) heart failure: Secondary | ICD-10-CM | POA: Diagnosis not present

## 2022-01-23 LAB — BASIC METABOLIC PANEL
Anion gap: 9 (ref 5–15)
BUN: 61 mg/dL — ABNORMAL HIGH (ref 8–23)
CO2: 26 mmol/L (ref 22–32)
Calcium: 10 mg/dL (ref 8.9–10.3)
Chloride: 110 mmol/L (ref 98–111)
Creatinine, Ser: 2.01 mg/dL — ABNORMAL HIGH (ref 0.61–1.24)
GFR, Estimated: 32 mL/min — ABNORMAL LOW (ref >60)
Glucose, Bld: 137 mg/dL — ABNORMAL HIGH (ref 70–99)
Potassium: 4.6 mmol/L (ref 3.5–5.1)
Sodium: 145 mmol/L (ref 135–145)

## 2022-01-23 LAB — MAGNESIUM: Magnesium: 3 mg/dL — ABNORMAL HIGH (ref 1.7–2.4)

## 2022-01-23 MED ORDER — METOPROLOL SUCCINATE ER 25 MG PO TB24: 25.0000 mg | ORAL_TABLET | Freq: Every day | ORAL | 1 refills | Status: DC

## 2022-01-23 MED ORDER — MILK AND MOLASSES ENEMA
1.0000 | Freq: Once | RECTAL | Status: AC
  Administered 2022-01-23: 240 mL via RECTAL
  Filled 2022-01-23: qty 240

## 2022-01-23 MED ORDER — SENNA 8.6 MG PO TABS: 2.0000 | ORAL_TABLET | Freq: Every day | ORAL | 0 refills | Status: DC

## 2022-01-23 MED ORDER — POTASSIUM CHLORIDE CRYS ER 20 MEQ PO TBCR: 40.0000 meq | EXTENDED_RELEASE_TABLET | Freq: Every day | ORAL | 0 refills | Status: DC

## 2022-01-23 MED ORDER — TORSEMIDE 20 MG PO TABS: 40.0000 mg | ORAL_TABLET | Freq: Every morning | ORAL | 0 refills | Status: DC

## 2022-01-23 NOTE — Discharge Instructions (Signed)

## 2022-01-23 NOTE — Progress Notes (Signed)
Progress Note  Patient Name: Richard [name] Date of Encounter: 01/23/2022  [facility] Cardiologist: Richard Breeding, MD   Subjective   Feeling well.  Eager to go home.   Inpatient Medications    Scheduled Meds:  allopurinol  450 mg Oral Daily   apixaban  2.5 mg Oral BID   atorvastatin  80 mg Oral QHS   dapagliflozin propanediol  10 mg Oral Daily   ezetimibe  10 mg Oral Daily   fenofibrate  160 mg Oral Daily   folic acid  2 mg Oral Daily   icosapent Ethyl  2 g Oral BID   lactulose  20 g Oral TID   metoprolol succinate  25 mg Oral Daily   potassium chloride SA  40 mEq Oral Daily   pregabalin  150 mg Oral BID   psyllium  1 packet Oral BID   sodium chloride flush  3 mL Intravenous Q12H   torsemide  40 mg Oral Daily   Continuous Infusions:  sodium chloride     PRN Meds: sodium chloride, acetaminophen, alum & mag hydroxide-simeth, ondansetron (ZOFRAN) IV, mouth rinse, sodium chloride, sodium chloride flush   Vital Signs    Vitals:   01/22/22 1224 01/22/22 2116 01/23/22 0500 01/23/22 0544  BP:  106/65  113/80  Pulse:  (!) 57  78  Resp:  20  20  Temp: (!) 97.4 F (36.3 C) 98 F (36.7 C)  (!) 97.5 F (36.4 C)  TempSrc: Oral Oral  Oral  SpO2:  94%  96%  Weight:   104.3 kg   Height:        Intake/Output Summary (Last 24 hours) at 01/23/2022 0951 Last data filed at 01/23/2022 0847 Gross per 24 hour  Intake 2080 ml  Output 1180 ml  Net 900 ml      01/23/2022    5:00 AM 01/22/2022    5:00 AM 01/21/2022    5:00 AM  Last 3 Weights  Weight (lbs) 230 lb 229 lb 4.5 oz 231 lb 8 oz  Weight (kg) 104.327 kg 104 kg 105.008 kg      Telemetry    Afib.  PV.  PVCs - Personally Reviewed  ECG    N/a - Personally Reviewed  Physical Exam   VS:  BP 113/80 (BP Location: Right Arm)   Pulse 78   Temp (!) 97.5 F (36.4 C) (Oral)   Resp 20   Ht 6' (1.829 m)   Wt 104.3 kg   SpO2 96%   BMI 31.19 kg/m  , BMI Body mass index is 31.19 kg/m. GENERAL:  Well  appearing HEENT: Pupils equal round and reactive, fundi not visualized, oral mucosa unremarkable NECK:  No jugular venous distention, waveform within normal limits, carotid upstroke brisk and symmetric, no bruits, no thyromegaly LUNGS:  Clear to auscultation bilaterally HEART:  Irregularly irregular.  PMI not displaced or sustained,S1 and S2 within normal limits, no S3, no S4, no clicks, no rubs, no murmurs ABD:  Central adiposity.  Positive bowel sounds normal in frequency in pitch, no bruits, no rebound, no guarding, no midline pulsatile mass, no hepatomegaly, no splenomegaly EXT:  2 plus pulses throughout, no edema, no cyanosis no clubbing SKIN:  No rashes no nodules NEURO:  Cranial nerves II through XII grossly intact, motor grossly intact throughout PSYCH:  Cognitively intact, oriented to person place and time  Labs    High Sensitivity Troponin:   Recent Labs  Lab 01/17/22 1147 01/17/22 1400  TROPONINIHS 34*  30*     Chemistry Recent Labs  Lab 01/16/22 1202 01/17/22 0514 01/21/22 0453 01/22/22 0445 01/22/22 0649 01/23/22 0455  NA 142   < > 141  --  139 145  K 4.3   < > 4.5  --  4.5 4.6  CL 108   < > 105  --  106 110  CO2 24   < > 25  --  21* 26  GLUCOSE 191*   < > 142*  --  150* 137*  BUN 45*   < > 57*  --  61* 61*  CREATININE 2.04*   < > 1.91*  --  1.87* 2.01*  CALCIUM 9.9   < > 10.1  --  9.6 10.0  MG  --   --  2.6* 2.6*  --  3.0*  PROT 7.4  --   --   --   --   --   ALBUMIN 4.3  --   --   --   --   --   AST 18  --   --   --   --   --   ALT 17  --   --   --   --   --   ALKPHOS 77  --   --   --   --   --   BILITOT 1.1  --   --   --   --   --   GFRNONAA 32*   < > 34*  --  35* 32*  ANIONGAP 10   < > 11  --  12 9   < > = values in this interval not displayed.    Lipids No results for input(s): "CHOL", "TRIG", "HDL", "LABVLDL", "LDLCALC", "CHOLHDL" in the last 168 hours.  Hematology Recent Labs  Lab 01/20/22 0518 01/21/22 0453 01/22/22 0649  WBC 7.2 7.6 8.2  RBC  4.80 4.62 4.80  HGB 13.6 13.4 13.6  HCT 43.8 42.4 43.9  MCV 91.3 91.8 91.5  MCH 28.3 29.0 28.3  MCHC 31.1 31.6 31.0  RDW 18.1* 17.7* 17.7*  PLT 185 177 212   Thyroid No results for input(s): "TSH", "FREET4" in the last 168 hours.  BNP Recent Labs  Lab 01/16/22 1202 01/17/22 0514  BNP 992.0* 969.2*    DDimer  Recent Labs  Lab 01/16/22 1920  DDIMER 0.49     Radiology    No results found.  Cardiac Studies   Echo 01/17/22: IMPRESSIONS     1. Left ventricular ejection fraction, by estimation, is 30 to 35%. The  left ventricle has moderately decreased function. The left ventricle  demonstrates regional wall motion abnormalities (see scoring  diagram/findings for description). The left  ventricular internal cavity size was moderately dilated. Left ventricular  diastolic parameters are consistent with Grade II diastolic dysfunction  (pseudonormalization). Elevated left ventricular end-diastolic pressure.   2. Right ventricular systolic function is normal. The right ventricular  size is normal. There is mildly elevated pulmonary artery systolic  pressure.   3. Left atrial size was mildly dilated.   4. Right atrial size was severely dilated.   5. The mitral valve is normal in structure. Trivial mitral valve  regurgitation. No evidence of mitral stenosis. Moderate mitral annular  calcification.   6. Tricuspid valve regurgitation is mild to moderate.   7. The aortic valve has been repaired/replaced. Aortic valve  regurgitation is not visualized. No aortic stenosis is present. There is a  26 mm Sapien prosthetic (TAVR) valve present  in the aortic position.   8. Aortic dilatation noted. There is borderline dilatation of the  ascending aorta, measuring 36 mm.   9. The inferior vena cava is dilated in size with <50% respiratory  variability, suggesting right atrial pressure of 15 mmHg.   Patient Profile     Richard [name] is an 77M with  with HFrEF, s/p TAVR, CAD s/p CABG,  hypertension, CHB s/p PPM, subclavian occlusion with leadless pacemaker implantation, OSA on CPAP, permanent atrial fibrillation, DM and prior GIB here with acute on chronic systolic and diastolic heart failure.  Assessment & Plan    # Acute on chronic systolic and diastolic HF:  # Hypertension:  He is feeling much better.  He appears to be euvolemic on exam.  He was net -1 L yesterday after switching to oral torsemide.  Today his creatinine increased slightly from 1.87 yesterday to 2.01 today.  He already received his dose of torsemide today.  Would recommend holding torsemide tomorrow and having him resume it on Friday.  Plan to repeat a BMP next week.  Continue metoprolol.  He is not on an ACE inhibitor or ARB due to angioedema with ACE inhibitors.  Continue Farxiga.  #Permanent atrial fibrillation: He remains in atrial fibrillation.  Continue Eliquis and metoprolol.  # Acute on chronic renal failure:  Creatinine slightly worse today as above.  Plan for BMP next week.  # s/p TAVR:  Stable on echo this admission.   # s/p CABG:  # Mixed hyperlipidemia: Continue Eliquis, metoprolol, atorvastatin, Zetia, fenofibrate.  He has significant hypertriglyceridemia.  He has seen Dr. Debara Pickett in the past and was being considered for some lipid trials.   [facility] will sign off.   Medication Recommendations:  Hold torsemide tomorrow.  Resume Friday Other recommendations (labs, testing, etc):  BMP in one week Follow up as an outpatient:  scheduled for 8/11  For questions or updates, please contact [place] Please consult [email or web address] for contact info under        Signed, Skeet Latch, MD  01/23/2022, 9:51 AM

## 2022-01-23 NOTE — Progress Notes (Signed)
Pt weaned off oxygen supplementation with CPAP and maintained O2sats at 92-96%

## 2022-01-23 NOTE — Discharge Summary (Addendum)
Physician Discharge Summary  Richard [name] YKD:[id number] DOB: [birth year]  PCP: [name], DO  Admitted from: Home Discharged to: Home  Admit date: 01/16/2022 Discharge date: 01/23/2022  Recommendations for Outpatient Follow-up:    Follow-up Information     [facility] Follow up in 1 week(s).   Specialty: Cardiology Why: Present to [facility] in 1 week to have blood drawn. You do not need an appointment and you do not need to be fasting. Lab hours are 8:30-3:30. Contact information: [address] [place] [phone]        Richard Breeding, MD Follow up on 02/08/2022.   Specialty: Cardiology Why: 7:40 am for hospital follow up Contact information: [address] [place] [address] [phone]         Richard [name], [place], DO. Schedule an appointment as soon as possible for a visit in 2 week(s).   Specialty: Family Medicine Why: Labs to be drawn either at the cancer center or at the cardiologist's office.  Kindly follow labs. Contact information: [address] [place] [address] [phone]                  Home Health: None    Equipment/Devices: None    Discharge Condition: Improved and stable.   Code Status: Full Code Diet recommendation:  Discharge Diet Orders (From admission, onward)     Start     Ordered   01/23/22 0000  Diet - low sodium heart healthy        01/23/22 1219   01/23/22 0000  Diet Carb Modified        01/23/22 1219             Discharge Diagnoses:  Principal Problem:   Acute exacerbation of CHF (congestive heart failure) (HCC) Active Problems:   Essential hypertension   OSA (obstructive sleep apnea)   Anemia, iron deficiency   Aortic stenosis, severe   Uncontrolled type 2 diabetes mellitus with hyperglycemia (HCC)   Cardiac pacemaker in situ   Hemoptysis   Brief Summary: 81 year old male with medical history significant  for HFrEF, s/p TAVR for severe aortic stenosis, CAD s/p CABG, HTN, CHB s/p PPM, subclavian occlusion with leadless pacemaker implantation, OSA on CPAP, permanent atrial fibrillation, type II DM with peripheral neuropathy, prior GI bleed, diffuse large B-cell lymphoma, esophageal stricture, GERD, gout, HLD presented with progressively worsening dyspnea with exertion over 3 to 6 months.  He reported that several months ago, and nephrologist took him off his Lasix and switched him to torsemide the dose of which was increased a week prior to admission by his cardiologist.  Patient reported losing 5 pounds after dose increase but he continued to become increasingly short of breath with exertion.  2D echo 12/19/2021 showed LVEF of 30-35% with wall motion abnormality.  However stress test was without evidence of ischemia or infarction but study was read as high risk.  Repeat echo 7/20 showed LVEF of 30-35% with regional wall motion abnormalities and grade 2 diastolic dysfunction.  He was then followed by pulmonology for ongoing dyspnea, chest x-ray demonstrated pulmonary vascular congestion with some pleural effusion, diuretics had been increased, repeat labs showed creatinine of 2.11 up from his baseline of 1.3-1.5.  He did admit to orthopnea, lower extremity swelling and hemoptysis.  Patient was admitted for acute on chronic combined diastolic and systolic CHF.  Assessment and plan:  Acute on chronic combined diastolic and systolic CHF: Cardiology  was consulted.  He was treated with IV Lasix from 7/19 - 7/23 with varying doses.  On 7/24, he was -8.5 L, BUN was climbing and creatinine was labile with diuresis.  IV Lasix was stopped.  Diuretics were held for a day and then patient was resumed on home dose of torsemide 40 mg daily from 7/25.  As per cardiology follow-up and communication with Dr. Oval Linsey, recommends holding torsemide tomorrow and having him resume it on Friday 7/28.  Clinically he feels much better,  euvolemic on exam.  Creatinine is slightly increased from 1.87 yesterday to 2.01 and management of diuretics as noted above.  He had already received his dose of torsemide today.  Plan is to repeat BMP next week, either at oncologist office (has prior appointment on 7/31) or at the cardiologist office.  Continue metoprolol (consolidated to Toprol-XL).  He is not on an ACEI or ARB due to angioedema with ACEI.  Continue Farxiga.  Net -9.1 L since admission and weight down by about 19 pounds.  Hemoptysis, resolved Likely secondary to decompensated CHF.  VQ scan negative for PE.  Lower extremity venous Doppler negative for DVT.  Remains on Eliquis for A-fib.  Permanent atrial fibrillation: He remains in atrial fibrillation.  Continue Eliquis and metoprolol (consolidated to Toprol-XL.  Essential hypertension: Continue Toprol-XL.  Acute on chronic kidney disease, stage IIIb: Creatinine slightly worse today as above.  Plan for BMP next week.   Severe aortic stenosis, s/p TAVR:  Stable on echo this admission.    s/p CABG:  Mixed hyperlipidemia: Continue Eliquis, metoprolol, atorvastatin, Zetia, fenofibrate.  He has significant hypertriglyceridemia.  He has seen Dr. Debara Pickett in the past and was being considered for some lipid trials.  S/p PPM  Type II DM, controlled with renal complications and peripheral neuropathy: It appears that blood glucose was only checked once a day and it was mostly in the 130-140 range.  Most recent A1c on 07/27/2021: 6.3.  Oral hypoglycemic agents were held in the hospital.  Recommend continuing to hold Amaryl and metformin due to worsening renal insufficiency and risk for hypoglycemia in this elderly male, continue to monitor daily CBGs and close follow-up with PCP to determine whether oral hypoglycemics need to be resumed.  Follow-up repeat A1c at PCPs office.  Iron deficiency anemia: Normal hemoglobin   OSA on CPAP: Continue CPAP.  Constipation: Patient reports  daily BMs at home but no BM for almost a week since hospital admission despite adjustment of bowel regimen.  Gave milk and molasses enema x1 prior to discharge and continued temporary Senokot at home.  Diffuse large B-cell lymphoma: Has upcoming close follow-up appointment with outpatient oncology with labs on 7/31.  Consultations: Cardiology Medical oncology  Procedures: None   Discharge Instructions  Discharge Instructions     (HEART FAILURE PATIENTS) Call MD:  Anytime you have any of the following symptoms: 1) 3 pound weight gain in 24 hours or 5 pounds in 1 week 2) shortness of breath, with or without a dry hacking cough 3) swelling in the hands, feet or stomach 4) if you have to sleep on extra pillows at night in order to breathe.   Complete by: As directed    Call MD for:  difficulty breathing, headache or visual disturbances   Complete by: As directed    Call MD for:  extreme fatigue   Complete by: As directed    Call MD for:  persistant dizziness or light-headedness   Complete by: As directed  Call MD for:  persistant nausea and vomiting   Complete by: As directed    Call MD for:  severe uncontrolled pain   Complete by: As directed    Call MD for:  temperature >100.4   Complete by: As directed    Diet - low sodium heart healthy   Complete by: As directed    Diet Carb Modified   Complete by: As directed    Discharge instructions   Complete by: As directed    1) your diabetes medications have been discontinued due to worsening kidney functions.  Continue to check your fingerstick blood sugar as you did prior to hospital admission.  Closely follow-up with your family physician to determine if these meds have to be resumed. 2) you need to have lab work done in a week's time.  This can be either done at the follow-up appointment that you have with your oncologist on 01/28/2022 or at the cardiologist office.   Increase activity slowly   Complete by: As directed          Medication List     STOP taking these medications    amLODipine 5 MG tablet Commonly known as: NORVASC   glimepiride 1 MG tablet Commonly known as: AMARYL   metFORMIN 500 MG tablet Commonly known as: GLUCOPHAGE   metoprolol tartrate 25 MG tablet Commonly known as: LOPRESSOR       TAKE these medications    acetaminophen 325 MG tablet Commonly known as: TYLENOL Take 650 mg by mouth at bedtime as needed for moderate pain or headache.   allopurinol 300 MG tablet Commonly known as: ZYLOPRIM Take 450 mg by mouth daily.   aluminum hydroxide-magnesium carbonate 95-358 MG/15ML Susp Commonly known as: GAVISCON Take 15 mLs by mouth as needed for indigestion or heartburn.   apixaban 2.5 MG Tabs tablet Commonly known as: ELIQUIS Take 1 tablet (2.5 mg total) by mouth 2 (two) times daily.   atorvastatin 80 MG tablet Commonly known as: LIPITOR TAKE 1 TABLET BY MOUTH EVERYDAY AT BEDTIME What changed: See the new instructions.   azelastine 0.1 % nasal spray Commonly known as: ASTELIN PLACE 2 SPRAYS INTO BOTH NOSTRILS AT BEDTIME AS NEEDED FOR RHINITIS OR ALLERGIES.   CO Q-10 PO Take 1 capsule by mouth daily.   esomeprazole 40 MG capsule Commonly known as: NexIUM Take 1 capsule (40 mg total) by mouth daily. What changed: when to take this   ezetimibe 10 MG tablet Commonly known as: ZETIA TAKE 1 TABLET BY MOUTH EVERY DAY   Farxiga 10 MG Tabs tablet Generic drug: dapagliflozin propanediol Take 10 mg by mouth in the morning.   fenofibrate 160 MG tablet TAKE 1 TABLET BY MOUTH EVERY DAY   folic acid 1 MG tablet Commonly known as: FOLVITE TAKE 2 TABLETS BY MOUTH EVERY DAY What changed: when to take this   freestyle lancets USE ONCE A DAY TO CHECK BLOOD SUGAR.   FREESTYLE LITE test strip Generic drug: glucose blood USE TO TEST BLOOD SUGAR ONCE A DAY. DX CODE: E11.9   icosapent Ethyl 1 g capsule Commonly known as: VASCEPA TAKE 2 CAPSULES BY MOUTH TWICE A  DAY What changed: when to take this   ICY HOT ADVANCED PAIN RELIEF EX Apply 1 application  topically daily as needed (to painful sites).   metoprolol succinate 25 MG 24 hr tablet Commonly known as: TOPROL-XL Take 1 tablet (25 mg total) by mouth daily. Start taking on: January 24, 2022   nitroGLYCERIN 0.4  MG SL tablet Commonly known as: NITROSTAT PLACE 1 TABLET (0.4 MG TOTAL) UNDER THE TONGUE EVERY 5 (FIVE) MINUTES AS NEEDED FOR CHEST PAIN.   potassium chloride SA 20 MEQ tablet Commonly known as: Klor-Con M20 Take 2 tablets (40 mEq total) by mouth daily. Start taking on: January 25, 2022 What changed:  See the new instructions. These instructions start on January 25, 2022. If you are unsure what to do until then, ask your doctor or other care provider.   pregabalin 150 MG capsule Commonly known as: LYRICA Take 1 capsule (150 mg total) by mouth 2 (two) times daily. What changed: when to take this   psyllium 58.6 % powder Commonly known as: METAMUCIL Take 1 packet by mouth daily as needed (for constipation- mix and drink).   senna 8.6 MG Tabs tablet Commonly known as: SENOKOT Take 2 tablets (17.2 mg total) by mouth at bedtime. This is for constipation.  Stop taking if you start having diarrhea.   torsemide 20 MG tablet Commonly known as: DEMADEX Take 2 tablets (40 mg total) by mouth in the morning. Do not take on 01/24/2022. Start taking on: January 25, 2022 What changed:  additional instructions These instructions start on January 25, 2022. If you are unsure what to do until then, ask your doctor or other care provider.   VITAMIN C PO Take 1 tablet by mouth daily with breakfast.       Allergies  Allergen Reactions   Ace Inhibitors Swelling and Other (See Comments)    Angioedema    Benazepril Swelling and Other (See Comments)    Angioedema; he is not a candidate for any angiotensin receptor blockers because of this significant allergic reaction. Because of a history of  documented adverse serious drug reaction;Medi Alert bracelet  is recommended   Entresto [Sacubitril-Valsartan] Swelling and Other (See Comments)    First-in-Class Angiotensin Receptor Neprilysin Inhibitor- Med was "red-flagged" by the patient's pharmacy for him to NOT take!   Hctz [Hydrochlorothiazide] Anaphylaxis and Swelling    Tongue and lip swelling    Aspirin Other (See Comments)    Gastritis, can aspirin not take 325 mg aspirin      Procedures/Studies: ECHOCARDIOGRAM COMPLETE  Result Date: 01/17/2022    ECHOCARDIOGRAM REPORT   Patient Name:   RUBENS CRANSTON Date of Exam: 01/17/2022 Medical Rec #:  [id number]      Height:       72.0 in Accession #:    [id number]     Weight:       249.1 lb Date of Birth:  02-22-1941      BSA:          2.339 m Patient Age:    81 years       BP:           101/60 mmHg Patient Gender: M              HR:           81 bpm. Exam Location:  Inpatient Procedure: 2D Echo, Cardiac Doppler and Color Doppler Indications:    CHF  History:        Patient has prior history of Echocardiogram examinations, most                 recent 12/18/2021. CHF, CAD; Risk Factors:Hypertension, Sleep                 Apnea and Diabetes.  Aortic Valve: 26 mm Sapien prosthetic, stented (TAVR) valve is                 present in the aortic position.  Sonographer:    Jefferey Pica Referring Phys: [id number] AVA SWAYZE IMPRESSIONS  1. Left ventricular ejection fraction, by estimation, is 30 to 35%. The left ventricle has moderately decreased function. The left ventricle demonstrates regional wall motion abnormalities (see scoring diagram/findings for description). The left ventricular internal cavity size was moderately dilated. Left ventricular diastolic parameters are consistent with Grade II diastolic dysfunction (pseudonormalization). Elevated left ventricular end-diastolic pressure.  2. Right ventricular systolic function is normal. The right ventricular size is normal. There is mildly  elevated pulmonary artery systolic pressure.  3. Left atrial size was mildly dilated.  4. Right atrial size was severely dilated.  5. The mitral valve is normal in structure. Trivial mitral valve regurgitation. No evidence of mitral stenosis. Moderate mitral annular calcification.  6. Tricuspid valve regurgitation is mild to moderate.  7. The aortic valve has been repaired/replaced. Aortic valve regurgitation is not visualized. No aortic stenosis is present. There is a 26 mm Sapien prosthetic (TAVR) valve present in the aortic position.  8. Aortic dilatation noted. There is borderline dilatation of the ascending aorta, measuring 36 mm.  9. The inferior vena cava is dilated in size with <50% respiratory variability, suggesting right atrial pressure of 15 mmHg. FINDINGS  Left Ventricle: Left ventricular ejection fraction, by estimation, is 30 to 35%. The left ventricle has moderately decreased function. The left ventricle demonstrates regional wall motion abnormalities. The left ventricular internal cavity size was moderately dilated. There is no left ventricular hypertrophy. Left ventricular diastolic parameters are consistent with Grade II diastolic dysfunction (pseudonormalization). Elevated left ventricular end-diastolic pressure.  LV Wall Scoring: The anterior septum and apex are akinetic. The entire anterior wall, entire lateral wall, inferior septum, and entire inferior wall are hypokinetic. Right Ventricle: The right ventricular size is normal. No increase in right ventricular wall thickness. Right ventricular systolic function is normal. There is mildly elevated pulmonary artery systolic pressure. The tricuspid regurgitant velocity is 2.57  m/s, and with an assumed right atrial pressure of 15 mmHg, the estimated right ventricular systolic pressure is 09.7 mmHg. Left Atrium: Left atrial size was mildly dilated. Right Atrium: Right atrial size was severely dilated. Pericardium: There is no evidence of  pericardial effusion. Mitral Valve: The mitral valve is normal in structure. Moderate mitral annular calcification. Trivial mitral valve regurgitation. No evidence of mitral valve stenosis. Tricuspid Valve: The tricuspid valve is normal in structure. Tricuspid valve regurgitation is mild to moderate. No evidence of tricuspid stenosis. Aortic Valve: The aortic valve has been repaired/replaced. Aortic valve regurgitation is not visualized. No aortic stenosis is present. Aortic valve peak gradient measures 14.4 mmHg. There is a 26 mm Sapien prosthetic, stented (TAVR) valve present in the  aortic position. Pulmonic Valve: The pulmonic valve was normal in structure. Pulmonic valve regurgitation is trivial. No evidence of pulmonic stenosis. Aorta: Aortic dilatation noted. There is borderline dilatation of the ascending aorta, measuring 36 mm. Venous: The inferior vena cava is dilated in size with less than 50% respiratory variability, suggesting right atrial pressure of 15 mmHg. IAS/Shunts: No atrial level shunt detected by color flow Doppler.  LEFT VENTRICLE PLAX 2D LVIDd:         6.10 cm   Diastology LVIDs:         5.10 cm   LV e' lateral:   7.43  cm/s LV PW:         1.20 cm   LV E/e' lateral: 16.4 LV IVS:        1.00 cm LVOT diam:     2.10 cm LV SV:         96 LV SV Index:   41 LVOT Area:     3.46 cm  RIGHT VENTRICLE            IVC RV Basal diam:  4.10 cm    IVC diam: 3.20 cm RV Mid diam:    4.50 cm RV S prime:     6.45 cm/s TAPSE (M-mode): 1.0 cm LEFT ATRIUM             Index        RIGHT ATRIUM           Index LA diam:        5.60 cm 2.39 cm/m   RA Area:     34.10 cm LA Vol (A2C):   72.9 ml 31.17 ml/m  RA Volume:   130.00 ml 55.58 ml/m LA Vol (A4C):   76.4 ml 32.66 ml/m LA Biplane Vol: 75.6 ml 32.32 ml/m  AORTIC VALVE                 PULMONIC VALVE AV Area (Vmax): 2.72 cm     PV Vmax:       0.83 m/s AV Vmax:        189.50 cm/s  PV Peak grad:  2.8 mmHg AV Peak Grad:   14.4 mmHg LVOT Vmax:      149.00 cm/s LVOT  Vmean:     93.400 cm/s LVOT VTI:       0.278 m  AORTA Ao Root diam: 3.40 cm Ao Asc diam:  3.60 cm MITRAL VALVE                TRICUSPID VALVE MV Area (PHT): 3.07 cm     TR Peak grad:   26.4 mmHg MV Decel Time: 247 msec     TR Vmax:        257.00 cm/s MR Peak grad: 84.6 mmHg MR Vmax:      460.00 cm/s   SHUNTS MV E velocity: 122.00 cm/s  Systemic VTI:  0.28 m MV A velocity: 83.90 cm/s   Systemic Diam: 2.10 cm MV E/A ratio:  1.45 Skeet Latch MD Electronically signed by Skeet Latch MD Signature Date/Time: 01/17/2022/3:55:48 PM    Final    NM Pulmonary Perfusion  Result Date: 01/17/2022 CLINICAL DATA:  High probability pulmonary embolus. EXAM: NUCLEAR MEDICINE PERFUSION LUNG SCAN TECHNIQUE: Perfusion images were obtained in multiple projections after intravenous injection of radiopharmaceutical. Ventilation scans intentionally deferred if perfusion scan and chest x-ray adequate for interpretation during COVID 19 epidemic. RADIOPHARMACEUTICALS:  4.4 mCi Tc-21mMAA IV COMPARISON:  Chest radiograph January 16, 2022 FINDINGS: No large wedge-shaped segmental perfusion defects. Relative homogeneous radiotracer uptake in the bilateral lungs. IMPRESSION: No scintigraphic evidence of pulmonary embolus. Electronically Signed   By: JDahlia BailiffM.D.   On: 01/17/2022 11:17   VAS [place]OWER EXTREMITY VENOUS (DVT)  Result Date: 01/16/2022  Lower Venous DVT Study Patient Name:  FJKAI ARWOOD Date of Exam:   01/16/2022 Medical Rec #: [id number]      Accession #:    [id number] of Birth: [birth year]      Patient Gender: M Patient Age:   89years [facility]  Procedure:      VAS [place] LOWER EXTREMITY VENOUS (DVT) Referring Phys: AVA SWAYZE --------------------------------------------------------------------------------  Indications: Swelling.  Risk Factors: None identified. Limitations: Poor ultrasound/tissue interface. Comparison Study: No prior studies. Performing Technologist: Oliver Hum RVT   Examination Guidelines: A complete evaluation includes B-mode imaging, spectral Doppler, color Doppler, and power Doppler as needed of all accessible portions of each vessel. Bilateral testing is considered an integral part of a complete examination. Limited examinations for reoccurring indications may be performed as noted. The reflux portion of the exam is performed with the patient in reverse Trendelenburg.  +---------+---------------+---------+-----------+----------+--------------+ RIGHT    CompressibilityPhasicitySpontaneityPropertiesThrombus Aging +---------+---------------+---------+-----------+----------+--------------+ CFV      Full           Yes      Yes                                 +---------+---------------+---------+-----------+----------+--------------+ SFJ      Full                                                        +---------+---------------+---------+-----------+----------+--------------+ FV Prox  Full                                                        +---------+---------------+---------+-----------+----------+--------------+ FV Mid   Full                                                        +---------+---------------+---------+-----------+----------+--------------+ FV DistalFull                                                        +---------+---------------+---------+-----------+----------+--------------+ PFV      Full                                                        +---------+---------------+---------+-----------+----------+--------------+ POP      Full           Yes      Yes                                 +---------+---------------+---------+-----------+----------+--------------+ PTV      Full                                                        +---------+---------------+---------+-----------+----------+--------------+ PERO     Full                                                         +---------+---------------+---------+-----------+----------+--------------+   +----+---------------+---------+-----------+----------+--------------+  LEFTCompressibilityPhasicitySpontaneityPropertiesThrombus Aging +----+---------------+---------+-----------+----------+--------------+ CFV Full           Yes      Yes                                 +----+---------------+---------+-----------+----------+--------------+    Summary: RIGHT: - There is no evidence of deep vein thrombosis in the lower extremity.  - No cystic structure found in the popliteal fossa.  LEFT: - No evidence of common femoral vein obstruction.  *See table(s) above for measurements and observations. Electronically signed by Monica Martinez MD on 01/16/2022 at 7:10:07 PM.    Final    DG Chest 2 View  Result Date: 01/16/2022 CLINICAL DATA:  sob EXAM: CHEST - 2 VIEW COMPARISON:  None Available. FINDINGS: Mild enlargement of the cardio mediastinal silhouette, stable. Sternotomy wires, aortic valve replacement changes and aortic arch atherosclerotic calcifications, stable. Again seen are the mild pulmonary vascular congestion and interstitial changes under pulmonary more prominent at the lung bases, stable. No focal consolidation. Trace bilateral pleural effusion. The visualized skeletal structures are unremarkable. IMPRESSION: There has been no significant interval change in the cardiomegaly, pulmonary vascular congestion, interstitial changes and likely trace bilateral pleural effusion. Electronically Signed   By: Frazier Richards M.D.   On: 01/16/2022 11:55   DG Chest 2 View  Result Date: 01/15/2022 CLINICAL DATA:  Pulmonary edema. EXAM: CHEST - 2 VIEW COMPARISON:  Chest radiographs 12/26/2021 FINDINGS: Sequelae of transcatheter aortic valve replacement are again identified. The cardiac silhouette remains mildly enlarged. Aortic atherosclerosis and a leadless pacemaker are again noted. Pulmonary vascular congestion and mild diffuse  interstitial prominence are similar to the prior study, and there are persistent trace bilateral pleural effusions. No pneumothorax is identified. No acute osseous abnormality is seen. IMPRESSION: Unchanged findings of mild interstitial edema and trace pleural effusions. Electronically Signed   By: Logan Bores M.D.   On: 01/15/2022 11:33   DG Chest 2 View  Result Date: 12/26/2021 CLINICAL DATA:  Hemoptysis EXAM: CHEST - 2 VIEW COMPARISON:  Previous studies including the examination of 11/28/2018 FINDINGS: Transverse diameter of heart is increased. There is previous cardiac surgery. Central pulmonary vessels are prominent. There are no signs of alveolar pulmonary edema. There is slight prominence of interstitial markings in the lower lung fields with no significant interval change. There is blunting of lateral CP angles. There is no pneumothorax. IMPRESSION: Cardiomegaly. Central pulmonary vessels are prominent suggesting mild CHF. Prominence of interstitial markings in the lower lung fields may suggest mild interstitial edema or interstitial pneumonia. Minimal pleural effusions. Electronically Signed   By: Elmer Picker M.D.   On: 12/26/2021 12:47      Subjective: Denies complaints.  Anxious to return home.  No chest pain, dyspnea, orthopnea and leg swellings have resolved.  Does report constipation without BM for almost a week despite adjustment of bowel regimen.  No abdominal pain, nausea or vomiting.  Reports daily BMs at home.  Discharge Exam:  Vitals:   01/22/22 1224 01/22/22 2116 01/23/22 0500 01/23/22 0544  BP:  106/65  113/80  Pulse:  (!) 57  78  Resp:  20  20  Temp: (!) 97.4 F (36.3 C) 98 F (36.7 C)  (!) 97.5 F (36.4 C)  TempSrc: Oral Oral  Oral  SpO2:  94%  96%  Weight:   104.3 kg   Height:        General: Elderly male, moderately built and  obese sitting up comfortably in reclining chair without distress. Cardiovascular: S1 & S2 heard, irregularly irregular, S1/S2 +.  No murmurs, rubs, gallops or clicks. No JVD or pedal edema. Respiratory: Mildly decreased breath sounds in the bases with occasional basal crackles but otherwise clear to auscultation without wheezing or rhonchi.  No increased work of breathing. Abdominal:  Non distended, non tender & soft. No organomegaly or masses appreciated. Normal bowel sounds heard. CNS: Alert and oriented. No focal deficits. Extremities: no edema, no cyanosis    The results of significant diagnostics from this hospitalization (including imaging, microbiology, ancillary and laboratory) are listed below for reference.     Microbiology: No results found for this or any previous visit (from the past 240 hour(s)).   Labs: CBC: Recent Labs  Lab 01/17/22 0514 01/20/22 0518 01/21/22 0453 01/22/22 0649  WBC 7.0 7.2 7.6 8.2  NEUTROABS 4.6 4.5 4.5 5.0  HGB 11.9* 13.6 13.4 13.6  HCT 39.0 43.8 42.4 43.9  MCV 92.6 91.3 91.8 91.5  PLT 131* 185 177 417    Basic Metabolic Panel: Recent Labs  Lab 01/19/22 0544 01/20/22 0518 01/21/22 0453 01/22/22 0445 01/22/22 0649 01/23/22 0455  NA 142 141 141  --  139 145  K 4.1 4.2 4.5  --  4.5 4.6  CL 105 102 105  --  106 110  CO2 '26 26 25  '$ --  21* 26  GLUCOSE 143* 142* 142*  --  150* 137*  BUN 49* 46* 57*  --  61* 61*  CREATININE 1.89* 1.68* 1.91*  --  1.87* 2.01*  CALCIUM 10.2 10.1 10.1  --  9.6 10.0  MG  --   --  2.6* 2.6*  --  3.0*    Liver Function Tests: No results for input(s): "AST", "ALT", "ALKPHOS", "BILITOT", "PROT", "ALBUMIN" in the last 168 hours.  CBG: Recent Labs  Lab 01/20/22 0342  GLUCAP 147*    I was unable to reach patient's son via phone but did leave a voicemail message.  I spoke with patient's sister via phone, updated care and answered all questions.  Time coordinating discharge: 45 minutes  SIGNED:  Vernell Leep, MD,  FACP, [facility], [facility], [facility] (Care Management Physician Certified). [facility]  To contact  the attending provider between 7A-7P or the covering provider during after hours 7P-7A, please log into the web site [email or web address] and access using universal [place] password for that web site. If you do not have the password, please call the hospital operator.

## 2022-01-23 NOTE — Plan of Care (Signed)
  Problem: Clinical Measurements: Goal: Ability to maintain clinical measurements within normal limits will improve Outcome: Completed/Met Goal: Will remain free from infection Outcome: Completed/Met Goal: Diagnostic test results will improve Outcome: Completed/Met Goal: Cardiovascular complication will be avoided Outcome: Completed/Met   Problem: Elimination: Goal: Will not experience complications related to bowel motility Outcome: Completed/Met Goal: Will not experience complications related to urinary retention Outcome: Completed/Met   Problem: Safety: Goal: Ability to remain free from injury will improve Outcome: Completed/Met   Problem: Skin Integrity: Goal: Risk for impaired skin integrity will decrease Outcome: Completed/Met   Problem: Education: Goal: Ability to demonstrate management of disease process will improve Outcome: Completed/Met Goal: Ability to verbalize understanding of medication therapies will improve Outcome: Completed/Met   Problem: Activity: Goal: Capacity to carry out activities will improve Outcome: Completed/Met   Problem: Cardiac: Goal: Ability to achieve and maintain adequate cardiopulmonary perfusion will improve Outcome: Completed/Met

## 2022-01-24 ENCOUNTER — Telehealth: Payer: Self-pay | Admitting: *Deleted

## 2022-01-24 ENCOUNTER — Encounter: Payer: Self-pay | Admitting: *Deleted

## 2022-01-24 NOTE — Patient Outreach (Addendum)
  Care Coordination TOC Note Transition Care Management Follow-up Telephone Call Date of discharge and from where: 01/23/22- [facility] How have you been since you were released from the hospital? "I am doing absolutely great since I got home; I feel like a new man; they pulled over 40 pounds of fluid off of me and I am able to walk on my treadmill and am not having any shortness of breath; I am glad I will be seeing my doctor tomorrow so she can look at all of my medicines" Any questions or concerns? No  Items Reviewed: Did the pt receive and understand the discharge instructions provided? Yes  Medications obtained and verified? Yes  Other?  N/A Any new allergies since your discharge? No  Dietary orders reviewed? Yes Do you have support at home? Yes - sister is assisting  Home Care and Equipment/Supplies: Were home health services ordered? no If so, what is the name of the agency? N/A  Has the agency set up a time to come to the patient's home? not applicable Were any new equipment or medical supplies ordered?  No What is the name of the medical supply agency? N/A Were you able to get the supplies/equipment? not applicable Do you have any questions related to the use of the equipment or supplies? No  Functional Questionnaire: (I = Independent and D = Dependent) ADLs: I  Bathing/Dressing- I  Meal Prep- I  Eating- I  Maintaining continence- I  Transferring/Ambulation- I  Managing Meds- I  Follow up appointments reviewed: yes  PCP Hospital f/u appt confirmed? Yes  Scheduled to see PCP on Friday, 01/25/22 @ 11:40 am Specialist Hospital f/u appt confirmed? Yes  Scheduled to see oncology provider on 01/28/22 @ 2:00 pm Are transportation arrangements needed? No  If their condition worsens, is the pt aware to call PCP or go to the Emergency Dept.? Yes Was the patient provided with contact information for the PCP's office or ED? Yes Was to pt encouraged to call back with questions  or concerns? Yes  SDOH assessments and interventions completed:   Yes  Care Coordination Interventions Activated:  No Care Coordination Interventions:   N/A  Encounter Outcome:  Pt. Visit Completed  Oneta Rack, RN, BSN, [place] RN [place] [facility] [phone]: direct office

## 2022-01-25 ENCOUNTER — Encounter: Payer: Self-pay | Admitting: Family Medicine

## 2022-01-25 ENCOUNTER — Ambulatory Visit (INDEPENDENT_AMBULATORY_CARE_PROVIDER_SITE_OTHER): Payer: Medicare Other | Admitting: Family Medicine

## 2022-01-25 ENCOUNTER — Telehealth: Payer: Self-pay | Admitting: Pulmonary Disease

## 2022-01-25 VITALS — BP 104/68 | HR 84 | Temp 97.8°F | Resp 20 | Ht 72.0 in | Wt 233.2 lb

## 2022-01-25 DIAGNOSIS — N183 Chronic kidney disease, stage 3 unspecified: Secondary | ICD-10-CM

## 2022-01-25 DIAGNOSIS — I10 Essential (primary) hypertension: Secondary | ICD-10-CM | POA: Diagnosis not present

## 2022-01-25 DIAGNOSIS — I25810 Atherosclerosis of coronary artery bypass graft(s) without angina pectoris: Secondary | ICD-10-CM | POA: Diagnosis not present

## 2022-01-25 DIAGNOSIS — I5033 Acute on chronic diastolic (congestive) heart failure: Secondary | ICD-10-CM | POA: Diagnosis not present

## 2022-01-25 DIAGNOSIS — I129 Hypertensive chronic kidney disease with stage 1 through stage 4 chronic kidney disease, or unspecified chronic kidney disease: Secondary | ICD-10-CM

## 2022-01-25 DIAGNOSIS — E1122 Type 2 diabetes mellitus with diabetic chronic kidney disease: Secondary | ICD-10-CM | POA: Diagnosis not present

## 2022-01-25 DIAGNOSIS — E785 Hyperlipidemia, unspecified: Secondary | ICD-10-CM

## 2022-01-25 DIAGNOSIS — G4733 Obstructive sleep apnea (adult) (pediatric): Secondary | ICD-10-CM

## 2022-01-25 DIAGNOSIS — E1169 Type 2 diabetes mellitus with other specified complication: Secondary | ICD-10-CM

## 2022-01-25 LAB — COMPREHENSIVE METABOLIC PANEL
ALT: 29 U/L (ref 0–53)
AST: 26 U/L (ref 0–37)
Albumin: 4.7 g/dL (ref 3.5–5.2)
Alkaline Phosphatase: 82 U/L (ref 39–117)
BUN: 52 mg/dL — ABNORMAL HIGH (ref 6–23)
CO2: 26 mEq/L (ref 19–32)
Calcium: 9.3 mg/dL (ref 8.4–10.5)
Chloride: 105 mEq/L (ref 96–112)
Creatinine, Ser: 1.83 mg/dL — ABNORMAL HIGH (ref 0.40–1.50)
GFR: 33.7 mL/min — ABNORMAL LOW (ref >60.00)
Glucose, Bld: 179 mg/dL — ABNORMAL HIGH (ref 70–99)
Potassium: 4.3 mEq/L (ref 3.5–5.1)
Sodium: 140 mEq/L (ref 135–145)
Total Bilirubin: 1 mg/dL (ref 0.2–1.2)
Total Protein: 7.1 g/dL (ref 6.0–8.3)

## 2022-01-25 LAB — HEMOGLOBIN A1C: Hgb A1c MFr Bld: 6.6 % — ABNORMAL HIGH (ref 4.6–6.5)

## 2022-01-25 LAB — LIPID PANEL
Cholesterol: 138 mg/dL (ref 0–200)
HDL: 28.9 mg/dL — ABNORMAL LOW (ref >39.00)
LDL Cholesterol: 76 mg/dL (ref 0–99)
NonHDL: 108.83
Total CHOL/HDL Ratio: 5
Triglycerides: 164 mg/dL — ABNORMAL HIGH (ref 0.0–149.0)
VLDL: 32.8 mg/dL (ref 0.0–40.0)

## 2022-01-25 NOTE — Patient Instructions (Signed)

## 2022-01-25 NOTE — Progress Notes (Signed)
Subjective:   By signing my name below, I, Richard [name], attest that this documentation has been prepared under the direction and in the presence of [name], DO 01/25/2022   Patient ID: Richard [name], male    DOB: 1941/01/07, 81 y.o.   MRN: [id number]  Chief Complaint  Patient presents with   Hospitalization Follow-up    CHF    HPI Patient is in today for hospital follow up.  He was admitted to the hospital 01/16/2022 for acute congestive heart failure. After cardiology was consulted he was treated with Lasix intravenously from July 19th to July 23rd 2023. His IV Lasix was stopped after is blood urea nitrogen elevated and his creatinine was labile with diuresis. He was sent home with torsemide 40 mg daily. He was discharged from the hospital on 01/23/2022.   He was experiencing shortness of breath and had to see a pulmonologist, but his symptoms have improved as of this time.   He complains of having poor circulation on the toes of his right extremity, and attributes this to the tightness of his socks.   He has been taken off of multiple medications due the effect on the kidneys.   He continues to take torsemide which he was given after being taken off of Lasix. While at the hospital he lost large amounts of excesses fluid, which contributed to his weight loss. His diet had changed while he was at the hospital as well, and he adds that he lost about 30 lbs.   He checks his blood sugar regularly, and this morning after he ate breakfast it was 180 mg/dL.   Past Medical History:  Diagnosis Date   Anemia    Arthritis    hips   Axillary adenopathy 02/25/2017   Bradycardia    a. holter monitor has demonstrated HRs in 30s and [place]    CAD (coronary artery disease)    a. s/p CABG 2001  b.  07/28/2017 cath:   Severe three-vessel native CAD with total occlusion of LAD, ramus intermedius, first OM and RCA, patent RIMA to PDA, LIMA to LAD, sequential SVG to ramus  intermedius and first OM.     Chronic lower back pain    Diffuse large B cell lymphoma (HCC)    Diverticulosis    Esophageal stricture    GERD (gastroesophageal reflux disease)    History of gout    HTN (hypertension)    Mixed hyperlipidemia    OSA on CPAP    with 2L O2 at night   Pancytopenia ([place])    a. related to chemo therapy for B cell lymphoma   Peptic stricture of esophagus    Presence of permanent cardiac pacemaker    sees Dr. Valaria Good pacemaker   SCC (squamous cell carcinoma) 01/31/2021   R zygoma, ED[facility]   SCC (squamous cell carcinoma) 01/31/2021   L post ankle, ED[facility]   SCC (squamous cell carcinoma) 01/31/2021   L popliteal, ED[facility]   Severe aortic stenosis    Spinal stenosis    Squamous cell carcinoma of skin 03/22/2009   Right mandible. SCCis, hypertrophic.    Squamous cell carcinoma of skin 12/25/2021   R lateral cheek, ED[facility]   Squamous cell carcinoma of skin 12/25/2021   R postauricular neck, EDC   Squamous cell carcinoma of skin 12/25/2021   R forearm sup, EDC   Squamous cell carcinoma of skin 12/25/2021   R wrist, EDC   Type II diabetes mellitus ([place])  Wears dentures    partial upper    Past Surgical History:  Procedure Laterality Date   APPENDECTOMY  ~ [place] Left 11/06/2016   Procedure: CATARACT EXTRACTION PHACO AND INTRAOCULAR LENS PLACEMENT (IOC);  Surgeon: Estill Cotta, MD;  Location: [facility];  Service: Ophthalmology;  Laterality: Left;  Lot # [id number] [place]: 01:09.4 AP%:25.2 CDE: 30.64   CATARACT EXTRACTION W/PHACO Right 12/04/2016   Procedure: CATARACT EXTRACTION PHACO AND INTRAOCULAR LENS PLACEMENT (IOC);  Surgeon: Estill Cotta, MD;  Location: [facility];  Service: Ophthalmology;  Laterality: Right;  [place] 1:25.9 AP% 24.1 CDE 39.10 Fluid Pack lot # [id number]   COLONOSCOPY W/ BIOPSIES AND POLYPECTOMY  07/02/2011   CORONARY ANGIOPLASTY  07/02/1991   CORONARY ANGIOPLASTY WITH STENT PLACEMENT   05/31/1997   "1"   CORONARY ARTERY BYPASS GRAFT  03/01/2000   "CABG X5"   ECTROPION REPAIR Right 09/01/2018   Procedure: REPAIR OF ECTROPION BILATERAL upper and lower;  Surgeon: Karle Starch, MD;  Location: [place];  Service: Ophthalmology;  Laterality: Right;  Diabetic - oral meds sleep apnea   ESOPHAGEAL DILATION  X 3-4   Dr. Lyla Son; "last one was in the 1990's"   ESOPHAGOGASTRODUODENOSCOPY     multiple   FLEXIBLE SIGMOIDOSCOPY     multiple   HYDRADENITIS EXCISION Left 02/25/2017   Procedure: EXCISION DEEP LEFT AXILLARY LYMPH NODE;  Surgeon: Fanny Skates, MD;  Location: [place];  Service: General;  Laterality: Left;   INTRAOPERATIVE TRANSTHORACIC ECHOCARDIOGRAM N/A 09/09/2017   Procedure: INTRAOPERATIVE TRANSTHORACIC ECHOCARDIOGRAM;  Surgeon: Burnell Blanks, MD;  Location: [place];  Service: Open Heart Surgery;  Laterality: N/A;   KNEE ARTHROSCOPY Left 07/01/2009   meniscus repair   LEFT HEART CATHETERIZATION WITH CORONARY/GRAFT ANGIOGRAM N/A 03/16/2014   Procedure: LEFT HEART CATHETERIZATION WITH Beatrix Fetters;  Surgeon: Burnell Blanks, MD;  Location[facility];  Service: Cardiovascular;  Laterality: N/A;   LUMBAR LAMINECTOMY/DECOMPRESSION MICRODISCECTOMY Right 06/17/2013   Procedure: LUMBAR LAMINECTOMY MICRODISCECTOMY L4-L5 RIGHT EXCISION OF SYNOVIAL CYST RIGHT   (1 LEVEL) RIGHT PARTIAL FACETECTOMY;  Surgeon: Tobi Bastos, MD;  Location: [facility];  Service: Orthopedics;  Laterality: Right;   MYELOGRAM  04/06/2013   lumbar, Dr Gladstone Lighter   ORBITAL LESION EXCISION Right 09/01/2018   Procedure: ORBITOTOMY WITHOUT BONE FLAP WITH REMOVAL OF LESION RIGHT;  Surgeon: Karle Starch, MD;  Location: [place];  Service: Ophthalmology;  Laterality: Right;   PACEMAKER IMPLANT N/A 09/10/2017   Procedure: PACEMAKER IMPLANT;  Surgeon: Deboraha Sprang, MD;  Location: [place] [facility];  Service: Cardiovascular;  Laterality: N/A;   PANENDOSCOPY      PERIPHERAL VASCULAR BALLOON ANGIOPLASTY Left 03/16/2021   Procedure: PERIPHERAL VASCULAR BALLOON ANGIOPLASTY;  Surgeon: [name], MD;  Location: [place] [facility];  Service: Cardiovascular;  Laterality: Left;  subclavian  vein   PORTACATH PLACEMENT N/A 03/06/2017   Procedure: INSERTION PORT-A-CATH AND ASPIRATE SEROMA LEFT AXILLA;  Surgeon: Fanny Skates, MD;  Location: [place];  Service: General;  Laterality: N/A;   RIGHT/LEFT HEART CATH AND CORONARY/GRAFT ANGIOGRAPHY N/A 07/28/2017   Procedure: RIGHT/LEFT HEART CATH AND CORONARY/GRAFT ANGIOGRAPHY;  Surgeon: Sherren Mocha, MD;  Location: [place] [facility];  Service: Cardiovascular;  Laterality: N/A;   SHOULDER SURGERY Right 08/30/2010   screws placed; "tendons tore off"   SKIN CANCER EXCISION Right    "neck"   TONSILLECTOMY  ~ [place]  Left 06/29/2019   Procedure: TOTAL KNEE ARTHROPLASTY;  Surgeon: Paralee Cancel, MD;  Location: [facility];  Service: Orthopedics;  Laterality: Left;  70 mins   TRANSCATHETER AORTIC VALVE REPLACEMENT, TRANSFEMORAL N/A 09/09/2017   Procedure: TRANSCATHETER AORTIC VALVE REPLACEMENT, TRANSFEMORAL;  Surgeon: Burnell Blanks, MD;  Location: [place];  Service: Open Heart Surgery;  Laterality: N/A;   UPPER EXTREMITY VENOGRAPHY Left 02/15/2021   Procedure: CENTRAL VENO;  Surgeon: [name], MD;  Location: [place] [facility];  Service: Cardiovascular;  Laterality: Left;   UPPER EXTREMITY VENOGRAPHY N/A 03/16/2021   Procedure: UPPER EXTREMITY VENOGRAPHY;  Surgeon: [name], MD;  Location: [place] [facility];  Service: Cardiovascular;  Laterality: N/A;   UPPER GI ENDOSCOPY  07/02/2011   Gastritis; Dr Carlean Purl   VASECTOMY     wireless pacemaker placed      Family History  Problem Relation Age of Onset   Stroke Father    Hypertension Father    Pancreatic cancer Mother    Diabetes Maternal Grandmother    Stroke Maternal Grandmother    Heart attack Paternal  Grandmother    Colon cancer Neg Hx    Esophageal cancer Neg Hx    Rectal cancer Neg Hx    Stomach cancer Neg Hx    Ulcers Neg Hx     Social History   Socioeconomic History   Marital status: Divorced    Spouse name: Not on file   Number of children: 2   Years of education: college   Highest education level: Not on file  Occupational History    Employer: RETIRED  Tobacco Use   Smoking status: Never   Smokeless tobacco: Never  Vaping Use   Vaping Use: Never used  Substance and Sexual Activity   Alcohol use: No    Alcohol/week: 0.0 standard drinks of alcohol    Comment: "last drink was in 2012"( 03/15/2014)   Drug use: No   Sexual activity: Not Currently  Other Topics Concern   Not on file  Social History Narrative   ** Merged History Encounter **       Divorced, lives with a roommate. 1 son one daughter 3-4 caffeinated beverages daily Right-handed. 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   Social Determinants of Health   Financial Resource Strain: Low Risk  (06/19/2021)   Overall Financial Resource Strain (CARDIA)    Difficulty of Paying Living Expenses: Not very hard  Food Insecurity: No Food Insecurity (01/24/2022)   Hunger Vital Sign    Worried About Running Out of Food in the Last Year: Never true    Ran Out of Food in the Last Year: Never true  Transportation Needs: No Transportation Needs (01/24/2022)   PRAPARE - Hydrologist (Medical): No    Lack of Transportation (Non-Medical): No  Physical Activity: Sufficiently Active (06/19/2021)   Exercise Vital Sign    Days of Exercise per Week: 5 days    Minutes of Exercise per Session: 30 min  Stress: Not on file  Social Connections: Moderately Isolated (09/06/2021)   Social Connection and Isolation Panel [NHANES]    Frequency of Communication with Friends and  Family: More than three times a week    Frequency of Social Gatherings with Friends and Family: More than three times a week    Attends Religious Services: More  than 4 times per year    Active Member of Clubs or Organizations: No    Attends Archivist Meetings: Never    Marital Status: Divorced  Human resources officer Violence: Not At Risk (09/06/2021)   Humiliation, Afraid, Rape, and Kick questionnaire    Fear of Current or Ex-Partner: No    Emotionally Abused: No    Physically Abused: No    Sexually Abused: No    Outpatient Medications Prior to Visit  Medication Sig Dispense Refill   acetaminophen (TYLENOL) 325 MG tablet Take 650 mg by mouth at bedtime as needed for moderate pain or headache.     allopurinol (ZYLOPRIM) 300 MG tablet Take 450 mg by mouth daily.     aluminum hydroxide-magnesium carbonate (GAVISCON) 95-358 MG/15ML SUSP Take 15 mLs by mouth as needed for indigestion or heartburn.     apixaban (ELIQUIS) 2.5 MG TABS tablet Take 1 tablet (2.5 mg total) by mouth 2 (two) times daily. 180 tablet 0   Ascorbic Acid (VITAMIN C PO) Take 1 tablet by mouth daily with breakfast.     atorvastatin (LIPITOR) 80 MG tablet TAKE 1 TABLET BY MOUTH EVERYDAY AT BEDTIME (Patient taking differently: Take 80 mg by mouth at bedtime.) 90 tablet 3   azelastine (ASTELIN) 0.1 % nasal spray PLACE 2 SPRAYS INTO BOTH NOSTRILS AT BEDTIME AS NEEDED FOR RHINITIS OR ALLERGIES. 30 mL 4   Coenzyme Q10 (CO Q-10 PO) Take 1 capsule by mouth daily.     esomeprazole (NEXIUM) 40 MG capsule Take 1 capsule (40 mg total) by mouth daily. (Patient taking differently: Take 40 mg by mouth daily before breakfast.) 30 capsule 5   ezetimibe (ZETIA) 10 MG tablet TAKE 1 TABLET BY MOUTH EVERY DAY (Patient taking differently: Take 10 mg by mouth daily.) 90 tablet 3   FARXIGA 10 MG TABS tablet Take 10 mg by mouth in the morning.     folic acid (FOLVITE) 1 MG tablet TAKE 2 TABLETS BY MOUTH EVERY DAY (Patient taking differently: Take  2 mg by mouth in the morning.) 180 tablet 4   FREESTYLE LITE test strip USE TO TEST BLOOD SUGAR ONCE A DAY. DX CODE: E11.9 100 strip 12   icosapent Ethyl (VASCEPA) 1 g capsule TAKE 2 CAPSULES BY MOUTH TWICE A DAY (Patient taking differently: Take 2 g by mouth in the morning and at bedtime.) 360 capsule 1   Lancets (FREESTYLE) lancets USE ONCE A DAY TO CHECK BLOOD SUGAR. 100 each 12   Menthol-Camphor (ICY HOT ADVANCED PAIN RELIEF EX) Apply 1 application  topically daily as needed (to painful sites).     nitroGLYCERIN (NITROSTAT) 0.4 MG SL tablet PLACE 1 TABLET (0.4 MG TOTAL) UNDER THE TONGUE EVERY 5 (FIVE) MINUTES AS NEEDED FOR CHEST PAIN. 25 tablet 6   potassium chloride SA (KLOR-CON M20) 20 MEQ tablet Take 2 tablets (40 mEq total) by mouth daily. 60 tablet 0   pregabalin (LYRICA) 150 MG capsule Take 1 capsule (150 mg total) by mouth 2 (two) times daily. (Patient taking differently: Take 150 mg by mouth in the morning and at bedtime.) 180 capsule 1   psyllium (METAMUCIL) 58.6 % powder Take 1 packet by mouth daily as needed (for constipation- mix and drink).     senna (SENOKOT) 8.6 MG TABS tablet Take 2 tablets (17.2 mg total) by mouth at bedtime. This is for constipation.  Stop taking if you start having diarrhea. 20 tablet 0   torsemide (DEMADEX) 20 MG tablet Take 2 tablets (  40 mg total) by mouth in the morning. Do not take on 01/24/2022. 60 tablet 0   fenofibrate 160 MG tablet TAKE 1 TABLET BY MOUTH EVERY DAY (Patient taking differently: Take 160 mg by mouth daily.) 90 tablet 0   metoprolol succinate (TOPROL-XL) 25 MG 24 hr tablet Take 1 tablet (25 mg total) by mouth daily. 30 tablet 1   No facility-administered medications prior to visit.    Allergies  Allergen Reactions   Ace Inhibitors Swelling and Other (See Comments)    Angioedema    Benazepril Swelling and Other (See Comments)    Angioedema; he is not a candidate for any angiotensin receptor blockers because of this significant allergic  reaction. Because of a history of documented adverse serious drug reaction;Medi Alert bracelet  is recommended   Entresto [Sacubitril-Valsartan] Swelling and Other (See Comments)    First-in-Class Angiotensin Receptor Neprilysin Inhibitor- Med was "red-flagged" by the patient's pharmacy for him to NOT take!   Hctz [Hydrochlorothiazide] Anaphylaxis and Swelling    Tongue and lip swelling    Aspirin Other (See Comments)    Gastritis, can aspirin not take 325 mg aspirin    Review of Systems  Constitutional:  Positive for weight loss.  Respiratory:  Positive for shortness of breath.   Musculoskeletal:        (+) Ischemia in toes on right lower extremity       Objective:    Physical Exam Vitals and nursing note reviewed.  Constitutional:      Appearance: He is well-developed.  HENT:     Head: Normocephalic and atraumatic.  Eyes:     Pupils: Pupils are equal, round, and reactive to light.  Neck:     Thyroid: No thyromegaly.  Cardiovascular:     Rate and Rhythm: Normal rate and regular rhythm.     Heart sounds: No murmur heard. Pulmonary:     Effort: Pulmonary effort is normal. No respiratory distress.     Breath sounds: Normal breath sounds. No wheezing or rales.  Chest:     Chest wall: No tenderness.  Musculoskeletal:     Cervical back: Normal range of motion and neck supple.     Right hip: Tenderness present. Normal range of motion. Normal strength.     Left hip: Tenderness present. Normal range of motion. Normal strength.     Right foot: Bony tenderness present. No swelling.     Left foot: Bony tenderness present. No swelling.  Skin:    General: Skin is warm and dry.  Neurological:     Mental Status: He is alert and oriented to person, place, and time.  Psychiatric:        Behavior: Behavior normal.        Thought Content: Thought content normal.        Judgment: Judgment normal.     BP 104/68 (BP Location: Left Arm, Patient Position: Sitting, Cuff Size: Large)    Pulse 84   Temp 97.8 F (36.6 C) (Oral)   Resp 20   Ht 6' (1.829 m)   Wt 233 lb 3.2 oz (105.8 kg)   SpO2 96%   BMI 31.63 kg/m  Wt Readings from Last 3 Encounters:  02/08/22 232 lb 3.2 oz (105.3 kg)  01/28/22 230 lb (104.3 kg)  01/25/22 233 lb 3.2 oz (105.8 kg)    Diabetic Foot Exam - Simple   No data filed    Lab Results  Component Value Date   WBC 7.0 01/28/2022  HGB 12.4 (L) 01/28/2022   HCT 40.1 01/28/2022   PLT 177 01/28/2022   GLUCOSE 258 (H) 02/08/2022   CHOL 138 01/25/2022   TRIG 164.0 (H) 01/25/2022   HDL 28.90 (L) 01/25/2022   LDLDIRECT 58.0 07/26/2021   LDLCALC 76 01/25/2022   ALT 26 01/28/2022   AST 27 01/28/2022   NA 138 02/08/2022   K 4.8 02/08/2022   CL 101 02/08/2022   CREATININE 1.86 (H) 02/08/2022   BUN 46 (H) 02/08/2022   CO2 22 02/08/2022   TSH 2.450 07/27/2018   PSA 1.03 07/26/2021   INR 1.15 11/15/2017   HGBA1C 6.6 (H) 01/25/2022   MICROALBUR <0.7 07/26/2021    Lab Results  Component Value Date   TSH 2.450 07/27/2018   Lab Results  Component Value Date   WBC 7.0 01/28/2022   HGB 12.4 (L) 01/28/2022   HCT 40.1 01/28/2022   MCV 92.2 01/28/2022   PLT 177 01/28/2022   Lab Results  Component Value Date   NA 138 02/08/2022   K 4.8 02/08/2022   CO2 22 02/08/2022   GLUCOSE 258 (H) 02/08/2022   BUN 46 (H) 02/08/2022   CREATININE 1.86 (H) 02/08/2022   BILITOT 0.6 01/28/2022   ALKPHOS 65 01/28/2022   AST 27 01/28/2022   ALT 26 01/28/2022   PROT 6.5 01/28/2022   ALBUMIN 3.3 (L) 01/28/2022   CALCIUM 9.6 02/08/2022   ANIONGAP 10 01/28/2022   EGFR 35 (L) 02/08/2022   GFR 33.70 (L) 01/25/2022   Lab Results  Component Value Date   CHOL 138 01/25/2022   Lab Results  Component Value Date   HDL 28.90 (L) 01/25/2022   Lab Results  Component Value Date   LDLCALC 76 01/25/2022   Lab Results  Component Value Date   TRIG 164.0 (H) 01/25/2022   Lab Results  Component Value Date   CHOLHDL 5 01/25/2022   Lab Results   Component Value Date   HGBA1C 6.6 (H) 01/25/2022       Assessment & Plan:   Problem List Items Addressed This Visit       Unprioritized   Hyperlipidemia associated with type 2 diabetes mellitus ([place])   Relevant Medications   glimepiride (AMARYL) 1 MG tablet   Other Relevant Orders   Comprehensive metabolic panel (Completed)   Hemoglobin A1c (Completed)   Lipid panel (Completed)   Acute on chronic diastolic CHF (congestive heart failure), NYHA class 2 (HCC)   Relevant Orders   Comprehensive metabolic panel (Completed)   Hemoglobin A1c (Completed)   Lipid panel (Completed)   Essential hypertension   Relevant Orders   Comprehensive metabolic panel (Completed)   Hemoglobin A1c (Completed)   Lipid panel (Completed)   Other Visit Diagnoses     Type 2 DM with CKD stage 3 and hypertension (HCC)    -  Primary   Relevant Medications   glimepiride (AMARYL) 1 MG tablet   Other Relevant Orders   Comprehensive metabolic panel (Completed)   Hemoglobin A1c (Completed)   Lipid panel (Completed)   Chronic renal insufficiency, stage 3 (moderate) (HCC)       Relevant Orders   Comprehensive metabolic panel (Completed)   Hemoglobin A1c (Completed)   Lipid panel (Completed)        Meds ordered this encounter  Medications   glimepiride (AMARYL) 1 MG tablet    Sig: Take 1 tablet (1 mg total) by mouth daily with breakfast.    Dispense:  30 tablet    Refill:  2    I, [name], DO, personally preformed the services described in this documentation.  All medical record entries made by the scribe were at my direction and in my presence.  I have reviewed the chart and discharge instructions (if applicable) and agree that the record reflects my personal performance and is accurate and complete. 01/25/2022   I,Tinashe Williams,acting as a scribe for [name], DO.,have documented all relevant documentation on the behalf of [name], DO,as directed by   [name], DO while in the presence of [name], DO.   [name], DO

## 2022-01-25 NOTE — Telephone Encounter (Signed)
I called the patient back and he was told that he was told that he would need  2 liters of oxygen added to his CPAP at night time.  The staff said they had to wake him up a few times to add oxygen under his CPAP and 2 liters would bring him above 90%. He is wanting to know if further testing would be needed to see if he would need to have oxygen testing with the CPAP in place. Please advise.

## 2022-01-25 NOTE — Telephone Encounter (Signed)
Left message for patient to call back  

## 2022-01-25 NOTE — Telephone Encounter (Signed)
Spoke with pt and explained that Dr. Elsworth Soho wanted ONO on C-Pap. Pt stated understanding and ONO was ordered. Nothing further needed at this time.

## 2022-01-28 ENCOUNTER — Inpatient Hospital Stay: Payer: Medicare Other | Attending: Hematology & Oncology

## 2022-01-28 ENCOUNTER — Inpatient Hospital Stay: Payer: Medicare Other

## 2022-01-28 ENCOUNTER — Telehealth: Payer: Self-pay | Admitting: Family Medicine

## 2022-01-28 ENCOUNTER — Inpatient Hospital Stay (HOSPITAL_BASED_OUTPATIENT_CLINIC_OR_DEPARTMENT_OTHER): Payer: Medicare Other | Admitting: Family

## 2022-01-28 ENCOUNTER — Encounter: Payer: Self-pay | Admitting: Family

## 2022-01-28 VITALS — BP 124/66 | HR 77 | Temp 97.9°F | Resp 17 | Ht 72.0 in | Wt 230.0 lb

## 2022-01-28 DIAGNOSIS — E1122 Type 2 diabetes mellitus with diabetic chronic kidney disease: Secondary | ICD-10-CM

## 2022-01-28 DIAGNOSIS — E1169 Type 2 diabetes mellitus with other specified complication: Secondary | ICD-10-CM

## 2022-01-28 DIAGNOSIS — C8332 Diffuse large B-cell lymphoma, intrathoracic lymph nodes: Secondary | ICD-10-CM | POA: Insufficient documentation

## 2022-01-28 DIAGNOSIS — I10 Essential (primary) hypertension: Secondary | ICD-10-CM | POA: Diagnosis not present

## 2022-01-28 DIAGNOSIS — D519 Vitamin B12 deficiency anemia, unspecified: Secondary | ICD-10-CM | POA: Diagnosis not present

## 2022-01-28 DIAGNOSIS — D510 Vitamin B12 deficiency anemia due to intrinsic factor deficiency: Secondary | ICD-10-CM | POA: Diagnosis not present

## 2022-01-28 DIAGNOSIS — I129 Hypertensive chronic kidney disease with stage 1 through stage 4 chronic kidney disease, or unspecified chronic kidney disease: Secondary | ICD-10-CM

## 2022-01-28 DIAGNOSIS — I25118 Atherosclerotic heart disease of native coronary artery with other forms of angina pectoris: Secondary | ICD-10-CM | POA: Diagnosis not present

## 2022-01-28 DIAGNOSIS — N183 Chronic kidney disease, stage 3 unspecified: Secondary | ICD-10-CM

## 2022-01-28 DIAGNOSIS — D508 Other iron deficiency anemias: Secondary | ICD-10-CM

## 2022-01-28 DIAGNOSIS — D500 Iron deficiency anemia secondary to blood loss (chronic): Secondary | ICD-10-CM

## 2022-01-28 DIAGNOSIS — E785 Hyperlipidemia, unspecified: Secondary | ICD-10-CM | POA: Diagnosis not present

## 2022-01-28 LAB — CMP (CANCER CENTER ONLY)
ALT: 26 U/L (ref 0–44)
AST: 27 U/L (ref 15–41)
Albumin: 3.3 g/dL — ABNORMAL LOW (ref 3.5–5.0)
Alkaline Phosphatase: 65 U/L (ref 38–126)
Anion gap: 10 (ref 5–15)
BUN: 41 mg/dL — ABNORMAL HIGH (ref 8–23)
CO2: 27 mmol/L (ref 22–32)
Calcium: 9 mg/dL (ref 8.9–10.3)
Chloride: 105 mmol/L (ref 98–111)
Creatinine: 1.93 mg/dL — ABNORMAL HIGH (ref 0.61–1.24)
GFR, Estimated: 34 mL/min — ABNORMAL LOW (ref >60)
Glucose, Bld: 218 mg/dL — ABNORMAL HIGH (ref 70–99)
Potassium: 4.4 mmol/L (ref 3.5–5.1)
Sodium: 142 mmol/L (ref 135–145)
Total Bilirubin: 0.6 mg/dL (ref 0.3–1.2)
Total Protein: 6.5 g/dL (ref 6.5–8.1)

## 2022-01-28 LAB — CBC WITH DIFFERENTIAL (CANCER CENTER ONLY)
Abs Immature Granulocytes: 0.09 10*3/uL — ABNORMAL HIGH (ref 0.00–0.07)
Basophils Absolute: 0 10*3/uL (ref 0.0–0.1)
Basophils Relative: 1 %
Eosinophils Absolute: 0.2 10*3/uL (ref 0.0–0.5)
Eosinophils Relative: 3 %
HCT: 40.1 % (ref 39.0–52.0)
Hemoglobin: 12.4 g/dL — ABNORMAL LOW (ref 13.0–17.0)
Immature Granulocytes: 1 %
Lymphocytes Relative: 22 %
Lymphs Abs: 1.6 10*3/uL (ref 0.7–4.0)
MCH: 28.5 pg (ref 26.0–34.0)
MCHC: 30.9 g/dL (ref 30.0–36.0)
MCV: 92.2 fL (ref 80.0–100.0)
Monocytes Absolute: 0.6 10*3/uL (ref 0.1–1.0)
Monocytes Relative: 8 %
Neutro Abs: 4.5 10*3/uL (ref 1.7–7.7)
Neutrophils Relative %: 65 %
Platelet Count: 177 10*3/uL (ref 150–400)
RBC: 4.35 MIL/uL (ref 4.22–5.81)
RDW: 17.2 % — ABNORMAL HIGH (ref 11.5–15.5)
WBC Count: 7 10*3/uL (ref 4.0–10.5)
nRBC: 0 % (ref 0.0–0.2)

## 2022-01-28 LAB — LACTATE DEHYDROGENASE: LDH: 161 U/L (ref 98–192)

## 2022-01-28 LAB — RETICULOCYTES
Immature Retic Fract: 21 % — ABNORMAL HIGH (ref 2.3–15.9)
RBC.: 4.42 MIL/uL (ref 4.22–5.81)
Retic Count, Absolute: 57.9 10*3/uL (ref 19.0–186.0)
Retic Ct Pct: 1.3 % (ref 0.4–3.1)

## 2022-01-28 LAB — VITAMIN B12: Vitamin B-12: 346 pg/mL (ref 180–914)

## 2022-01-28 LAB — FERRITIN: Ferritin: 1987 ng/mL — ABNORMAL HIGH (ref 24–336)

## 2022-01-28 MED ORDER — DENOSUMAB 120 MG/1.7ML ~~LOC~~ SOLN
120.0000 mg | Freq: Once | SUBCUTANEOUS | Status: AC
  Administered 2022-01-28: 120 mg via SUBCUTANEOUS
  Filled 2022-01-28: qty 1.7

## 2022-01-28 MED ORDER — CYANOCOBALAMIN 1000 MCG/ML IJ SOLN
1000.0000 ug | Freq: Once | INTRAMUSCULAR | Status: AC
  Administered 2022-01-28: 1000 ug via INTRAMUSCULAR
  Filled 2022-01-28: qty 1

## 2022-01-28 NOTE — Telephone Encounter (Signed)
Patient called requesting a call to discuss labs once Dr. Etter Sjogren has reviewed them.

## 2022-01-28 NOTE — Progress Notes (Signed)
Hematology and Oncology Follow Up Visit  Richard [name] [id number] Jul 05, 1940 81 y.o. 01/28/2022   Principle Diagnosis:  Diffuse large cell non-Hodgkin's lymphoma (IPI = 3) - NOT "double hit" Pernicious anemia Iron deficiency secondary to bleeding   Past Therapy: R-CHOP - s/p cycle 8 - completed 08/2017   Current Therapy:        Vitamin B12 1 mg IM every month Xgeva 120 mg subcu q 3 months - next dose due in 03/2022 IV iron as indicated    Interim History:  Richard [name] is here today for follow-up and B 12 injection. He is feeling "great" since getting out the hospital for heart failure.  He was diuresed and lost over 30 lbs of fluid weight.  He is ready to get back out on the golf course.  All the fluid in the left arm has resolved.  Neuropathy in his feet is stable and unchanged from baseline.  No falls or syncope reported.  No fever, chills, n/v, cough, rash, dizziness, SOB, chest pain, palpitations, abdominal pain or changes in bowel or bladder habits at this time.  Appetite and hydration are good. He weighs himself daily and weight have remained stable.   ECOG Performance Status: 1 - Symptomatic but completely ambulatory  Medications:  Allergies as of 01/28/2022       Reactions   Ace Inhibitors Swelling, Other (See Comments)   Angioedema   Benazepril Swelling, Other (See Comments)   Angioedema; he is not a candidate for any angiotensin receptor blockers because of this significant allergic reaction. Because of a history of documented adverse serious drug reaction;Medi Alert bracelet  is recommended   Entresto [sacubitril-valsartan] Swelling, Other (See Comments)   First-in-Class Angiotensin Receptor Neprilysin Inhibitor- Med was "red-flagged" by the patient's pharmacy for him to NOT take!   Hctz [hydrochlorothiazide] Anaphylaxis, Swelling   Tongue and lip swelling   Aspirin Other (See Comments)   Gastritis, can aspirin not take 325 mg aspirin        Medication List         Accurate as of January 28, 2022  2:07 PM. If you have any questions, ask your nurse or doctor.          acetaminophen 325 MG tablet Commonly known as: TYLENOL Take 650 mg by mouth at bedtime as needed for moderate pain or headache.   allopurinol 300 MG tablet Commonly known as: ZYLOPRIM Take 450 mg by mouth daily.   aluminum hydroxide-magnesium carbonate 95-358 MG/15ML Susp Commonly known as: GAVISCON Take 15 mLs by mouth as needed for indigestion or heartburn.   apixaban 2.5 MG Tabs tablet Commonly known as: ELIQUIS Take 1 tablet (2.5 mg total) by mouth 2 (two) times daily.   atorvastatin 80 MG tablet Commonly known as: LIPITOR TAKE 1 TABLET BY MOUTH EVERYDAY AT BEDTIME What changed: See the new instructions.   azelastine 0.1 % nasal spray Commonly known as: ASTELIN PLACE 2 SPRAYS INTO BOTH NOSTRILS AT BEDTIME AS NEEDED FOR RHINITIS OR ALLERGIES.   CO Q-10 PO Take 1 capsule by mouth daily.   esomeprazole 40 MG capsule Commonly known as: NexIUM Take 1 capsule (40 mg total) by mouth daily. What changed: when to take this   ezetimibe 10 MG tablet Commonly known as: ZETIA TAKE 1 TABLET BY MOUTH EVERY DAY   Farxiga 10 MG Tabs tablet Generic drug: dapagliflozin propanediol Take 10 mg by mouth in the morning.   fenofibrate 160 MG tablet TAKE 1 TABLET BY MOUTH EVERY DAY  folic acid 1 MG tablet Commonly known as: FOLVITE TAKE 2 TABLETS BY MOUTH EVERY DAY What changed: when to take this   freestyle lancets USE ONCE A DAY TO CHECK BLOOD SUGAR.   FREESTYLE LITE test strip Generic drug: glucose blood USE TO TEST BLOOD SUGAR ONCE A DAY. DX CODE: E11.9   icosapent Ethyl 1 g capsule Commonly known as: VASCEPA TAKE 2 CAPSULES BY MOUTH TWICE A DAY What changed: when to take this   ICY HOT ADVANCED PAIN RELIEF EX Apply 1 application  topically daily as needed (to painful sites).   metoprolol succinate 25 MG 24 hr tablet Commonly known as: TOPROL-XL Take  1 tablet (25 mg total) by mouth daily.   nitroGLYCERIN 0.4 MG SL tablet Commonly known as: NITROSTAT PLACE 1 TABLET (0.4 MG TOTAL) UNDER THE TONGUE EVERY 5 (FIVE) MINUTES AS NEEDED FOR CHEST PAIN.   potassium chloride SA 20 MEQ tablet Commonly known as: Klor-Con M20 Take 2 tablets (40 mEq total) by mouth daily.   pregabalin 150 MG capsule Commonly known as: LYRICA Take 1 capsule (150 mg total) by mouth 2 (two) times daily. What changed: when to take this   psyllium 58.6 % powder Commonly known as: METAMUCIL Take 1 packet by mouth daily as needed (for constipation- mix and drink).   senna 8.6 MG Tabs tablet Commonly known as: SENOKOT Take 2 tablets (17.2 mg total) by mouth at bedtime. This is for constipation.  Stop taking if you start having diarrhea.   torsemide 20 MG tablet Commonly known as: DEMADEX Take 2 tablets (40 mg total) by mouth in the morning. Do not take on 01/24/2022.   VITAMIN C PO Take 1 tablet by mouth daily with breakfast.        Allergies:  Allergies  Allergen Reactions   Ace Inhibitors Swelling and Other (See Comments)    Angioedema    Benazepril Swelling and Other (See Comments)    Angioedema; he is not a candidate for any angiotensin receptor blockers because of this significant allergic reaction. Because of a history of documented adverse serious drug reaction;Medi Alert bracelet  is recommended   Entresto [Sacubitril-Valsartan] Swelling and Other (See Comments)    First-in-Class Angiotensin Receptor Neprilysin Inhibitor- Med was "red-flagged" by the patient's pharmacy for him to NOT take!   Hctz [Hydrochlorothiazide] Anaphylaxis and Swelling    Tongue and lip swelling    Aspirin Other (See Comments)    Gastritis, can aspirin not take 325 mg aspirin    Past Medical History, Surgical history, Social history, and Family History were reviewed and updated.  Review of Systems: All other 10 point review of systems is negative.   Physical  Exam:  vitals were not taken for this visit.   Wt Readings from Last 3 Encounters:  01/25/22 233 lb 3.2 oz (105.8 kg)  01/23/22 230 lb (104.3 kg)  01/15/22 261 lb (118.4 kg)    Ocular: Sclerae unicteric, pupils equal, round and reactive to light Ear-nose-throat: Oropharynx clear, dentition fair Lymphatic: No cervical or supraclavicular adenopathy Lungs no rales or rhonchi, good excursion bilaterally Heart regular rate and rhythm, no murmur appreciated Abd soft, nontender, positive bowel sounds MSK no focal spinal tenderness, no joint edema Neuro: non-focal, well-oriented, appropriate affect Breasts: Deferred   Lab Results  Component Value Date   WBC 8.2 01/22/2022   HGB 13.6 01/22/2022   HCT 43.9 01/22/2022   MCV 91.5 01/22/2022   PLT 212 01/22/2022   Lab Results  Component Value Date  FERRITIN 1,374 (H) 12/28/2021   IRON 62 12/28/2021   TIBC 353 12/28/2021   UIBC 291 12/28/2021   IRONPCTSAT 18 12/28/2021   Lab Results  Component Value Date   RETICCTPCT 2.0 12/28/2021   RBC 4.80 01/22/2022   RETICCTABS 52.1 11/22/2011   No results found for: "KPAFRELGTCHN", "LAMBDASER", "[facility]" Lab Results  Component Value Date   IGA 159 03/09/2012   Lab Results  Component Value Date   ALBUMINELP 4.2 07/27/2018   MSPIKE Not Observed 07/27/2018     Chemistry      Component Value Date/Time   NA 140 01/25/2022 1218   NA 141 11/29/2021 1401   NA 144 06/27/2017 0857   K 4.3 01/25/2022 1218   K 3.9 06/27/2017 0857   CL 105 01/25/2022 1218   CL 103 06/27/2017 0857   CO2 26 01/25/2022 1218   CO2 26 06/27/2017 0857   BUN 52 (H) 01/25/2022 1218   BUN 34 (H) 11/29/2021 1401   BUN 12 06/27/2017 0857   CREATININE 1.83 (H) 01/25/2022 1218   CREATININE 2.17 (H) 12/28/2021 1338   CREATININE 1.65 (H) 04/18/2020 0956      Component Value Date/Time   CALCIUM 9.3 01/25/2022 1218   CALCIUM 9.2 06/27/2017 0857   ALKPHOS 82 01/25/2022 1218   ALKPHOS 128 (H) 06/27/2017 0857    AST 26 01/25/2022 1218   AST 12 (L) 12/28/2021 1338   ALT 29 01/25/2022 1218   ALT 12 12/28/2021 1338   ALT 24 06/27/2017 0857   BILITOT 1.0 01/25/2022 1218   BILITOT 0.8 12/28/2021 1338       Impression and Plan: Mr. Spickler is a very pleasant 81 yo caucasian gentleman with diffuse large B-cell lymphoma (not "double hit" lymphoma). He completed treatment in February 2019.  B 12 injection and Xgeva today as planned.  Iron studies pending. Follow-up in 1 month.   Lottie Dawson, NP 7/31/20232:07 PM

## 2022-01-28 NOTE — Telephone Encounter (Signed)
Noted  

## 2022-01-29 LAB — IRON AND IRON BINDING CAPACITY (CC-WL,HP ONLY)
Iron: 80 ug/dL (ref 45–182)
Saturation Ratios: 21 % (ref 17.9–39.5)
TIBC: 377 ug/dL (ref 250–450)
UIBC: 297 ug/dL (ref 117–376)

## 2022-01-30 ENCOUNTER — Encounter: Payer: Self-pay | Admitting: Family Medicine

## 2022-02-01 NOTE — Telephone Encounter (Signed)
Patient called stating he has been off his blood sugar medication and needs his lab reviews to know what the next steps are. He is scared that being off his medication might cause him more damage. Please advise.

## 2022-02-04 NOTE — Telephone Encounter (Signed)
Pt called to advise Dr. Etter Sjogren that the [facility] medicine that she put him back on is working and he is pleased with the effectiveness. Pt stated his BS went down from 190 on 8.4.23 to 130 on 8.7.23 and will keep on the medication until Dr. Etter Sjogren tells him otherwise.

## 2022-02-06 DIAGNOSIS — I5042 Chronic combined systolic (congestive) and diastolic (congestive) heart failure: Secondary | ICD-10-CM | POA: Insufficient documentation

## 2022-02-06 NOTE — Progress Notes (Signed)
Cardiology Office Note   Date:  02/08/2022   ID:  OCTAVIA MOTTOLA, DOB 1941/02/14, MRN [id number]  PCP:  Carollee Herter, Alferd Apa, DO  Cardiologist:   Minus Breeding, MD  Chief Complaint  Patient presents with   Shortness of Breath      History of Present Illness: Richard [name] is a 81 y.o. male who presents for follow up of CAD and TAVR.  He has had atrial flutter and heart block as well necessitating a pacemaker.   He has had continued problems with triglycerides.  He has some renal insufficiency.   He had a leadless pacemaker placed.   This was in response to subclavian stenosis associated with a transvenous pacemaker.   He still has chronic thrombosis .  He also was in the hospital  at [facility] in [place] with acute sepsis.  The etiology was not clear.  He did recover from this.  He presented with altered mental status and loss of consciousness.  I saw him earlier this year and he had  some shortness of breath.  He was sent for stress perfusion study there was no suggestion of prior ischemia or prior infarct.  However, his ejection fraction was reduced down to 30%.  He had an echocardiogram which agreed that the EF was 30 to 35%.  This is reduced compared to previous.   He was in the hospital in July diuresis.  He was sent home with orders to stop amlodipine, glimepiride, metformin.  He was sent home on 40 mg of torsemide daily as well as 20 mEq potassium.  His glimepiride was restarted by his primary provider.  Between from his usual 256 to 3 without change in his diet.  When he was sent home his weight was 225.  He feels much better.  He is not having any shortness of breath.  Has been exercising and playing golf.  The patient denies any new symptoms such as chest discomfort, neck or arm discomfort. There has been no new shortness of breath, PND or orthopnea. There have been no reported palpitations, presyncope or syncope.    Past Medical History:  Diagnosis Date   Anemia     Arthritis    hips   Axillary adenopathy 02/25/2017   Bradycardia    a. holter monitor has demonstrated HRs in 30s and [place]    CAD (coronary artery disease)    a. s/p CABG 2001  b.  07/28/2017 cath:   Severe three-vessel native CAD with total occlusion of LAD, ramus intermedius, first OM and RCA, patent RIMA to PDA, LIMA to LAD, sequential SVG to ramus intermedius and first OM.     Chronic lower back pain    Diffuse large B cell lymphoma (HCC)    Diverticulosis    Esophageal stricture    GERD (gastroesophageal reflux disease)    History of gout    HTN (hypertension)    Mixed hyperlipidemia    OSA on CPAP    with 2L O2 at night   Pancytopenia ([place])    a. related to chemo therapy for B cell lymphoma   Peptic stricture of esophagus    Presence of permanent cardiac pacemaker    sees Dr. Valaria Good pacemaker   SCC (squamous cell carcinoma) 01/31/2021   R zygoma, EDC   SCC (squamous cell carcinoma) 01/31/2021   L post ankle, EDC   SCC (squamous cell carcinoma) 01/31/2021   L popliteal, EDC   Severe aortic stenosis  Spinal stenosis    Squamous cell carcinoma of skin 03/22/2009   Right mandible. SCCis, hypertrophic.    Squamous cell carcinoma of skin 12/25/2021   R lateral cheek, EDC   Squamous cell carcinoma of skin 12/25/2021   R postauricular neck, EDC   Squamous cell carcinoma of skin 12/25/2021   R forearm sup, EDC   Squamous cell carcinoma of skin 12/25/2021   R wrist, EDC   Type II diabetes mellitus ([place])    Wears dentures    partial upper    Past Surgical History:  Procedure Laterality Date   APPENDECTOMY  ~ [place] Left 11/06/2016   Procedure: CATARACT EXTRACTION PHACO AND INTRAOCULAR LENS PLACEMENT ([place]);  Surgeon: Estill Cotta, MD;  Location: [facility];  Service: Ophthalmology;  Laterality: Left;  Lot # [id number] H [place]: 01:09.4 AP%:25.2 CDE: 30.64   CATARACT EXTRACTION W/PHACO Right 12/04/2016    Procedure: CATARACT EXTRACTION PHACO AND INTRAOCULAR LENS PLACEMENT (I[place]);  Surgeon: Estill Cotta, MD;  Location: [facility];  Service: Ophthalmology;  Laterality: Right;  [place] 1:25.9 AP% 24.1 CDE 39.10 Fluid Pack lot # [id number]   COLONOSCOPY W/ BIOPSIES AND POLYPECTOMY  07/02/2011   CORONARY ANGIOPLASTY  07/02/1991   CORONARY ANGIOPLASTY WITH STENT PLACEMENT  05/31/1997   "1"   CORONARY ARTERY BYPASS GRAFT  03/01/2000   "CABG X5"   ECTROPION REPAIR Right 09/01/2018   Procedure: REPAIR OF ECTROPION BILATERAL upper and lower;  Surgeon: Karle Starch, MD;  Location: [place];  Service: Ophthalmology;  Laterality: Right;  Diabetic - oral meds sleep apnea   ESOPHAGEAL DILATION  X 3-4   Dr. Lyla Son; "last one was in the 1990's"   ESOPHAGOGASTRODUODENOSCOPY     multiple   FLEXIBLE SIGMOIDOSCOPY     multiple   HYDRADENITIS EXCISION Left 02/25/2017   Procedure: EXCISION DEEP LEFT AXILLARY LYMPH NODE;  Surgeon: Fanny Skates, MD;  Location: [place];  Service: General;  Laterality: Left;   INTRAOPERATIVE TRANSTHORACIC ECHOCARDIOGRAM N/A 09/09/2017   Procedure: INTRAOPERATIVE TRANSTHORACIC ECHOCARDIOGRAM;  Surgeon: Burnell Blanks, MD;  Location: [place];  Service: Open Heart Surgery;  Laterality: N/A;   KNEE ARTHROSCOPY Left 07/01/2009   meniscus repair   LEFT HEART CATHETERIZATION WITH CORONARY/GRAFT ANGIOGRAM N/A 03/16/2014   Procedure: LEFT HEART CATHETERIZATION WITH Beatrix Fetters;  Surgeon: Burnell Blanks, MD;  Location: [facility] CATH LAB;  Service: Cardiovascular;  Laterality: N/A;   LUMBAR LAMINECTOMY/DECOMPRESSION MICRODISCECTOMY Right 06/17/2013   Procedure: LUMBAR LAMINECTOMY MICRODISCECTOMY L4-L5 RIGHT EXCISION OF SYNOVIAL CYST RIGHT   (1 LEVEL) RIGHT PARTIAL FACETECTOMY;  Surgeon: Tobi Bastos, MD;  Location: [facility];  Service: Orthopedics;  Laterality: Right;   MYELOGRAM  04/06/2013   lumbar, Dr Gladstone Lighter   ORBITAL LESION EXCISION Right 09/01/2018    Procedure: ORBITOTOMY WITHOUT BONE FLAP WITH REMOVAL OF LESION RIGHT;  Surgeon: Karle Starch, MD;  Location: [place];  Service: Ophthalmology;  Laterality: Right;   PACEMAKER IMPLANT N/A 09/10/2017   Procedure: PACEMAKER IMPLANT;  Surgeon: Deboraha Sprang, MD;  Location: [place] CV LAB;  Service: Cardiovascular;  Laterality: N/A;   PANENDOSCOPY     PERIPHERAL VASCULAR BALLOON ANGIOPLASTY Left 03/16/2021   Procedure: PERIPHERAL VASCULAR BALLOON ANGIOPLASTY;  Surgeon: [name], MD;  Location: [place] [facility] LAB;  Service: Cardiovascular;  Laterality: Left;  subclavian  vein   PORTACATH PLACEMENT N/A 03/06/2017   Procedure: INSERTION PORT-A-CATH AND ASPIRATE SEROMA LEFT AXILLA;  Surgeon:  Fanny Skates, MD;  Location: [place];  Service: General;  Laterality: N/A;   RIGHT/LEFT HEART CATH AND CORONARY/GRAFT ANGIOGRAPHY N/A 07/28/2017   Procedure: RIGHT/LEFT HEART CATH AND CORONARY/GRAFT ANGIOGRAPHY;  Surgeon: Sherren Mocha, MD;  Location: [place] [facility];  Service: Cardiovascular;  Laterality: N/A;   SHOULDER SURGERY Right 08/30/2010   screws placed; "tendons tore off"   SKIN CANCER EXCISION Right    "neck"   TONSILLECTOMY  ~ [place] Left 06/29/2019   Procedure: TOTAL KNEE ARTHROPLASTY;  Surgeon: Paralee Cancel, MD;  Location[facility];  Service: Orthopedics;  Laterality: Left;  70 mins   TRANSCATHETER AORTIC VALVE REPLACEMENT, TRANSFEMORAL N/A 09/09/2017   Procedure: TRANSCATHETER AORTIC VALVE REPLACEMENT, TRANSFEMORAL;  Surgeon: Burnell Blanks, MD;  Location: [place];  Service: Open Heart Surgery;  Laterality: N/A;   UPPER EXTREMITY VENOGRAPHY Left 02/15/2021   Procedure: CENTRAL VENO;  Surgeon: [name], MD;  Location: [place] [facility];  Service: Cardiovascular;  Laterality: Left;   UPPER EXTREMITY VENOGRAPHY N/A 03/16/2021   Procedure: UPPER EXTREMITY VENOGRAPHY;  Surgeon: [name], MD;  Location: [place] [facility] LAB;   Service: Cardiovascular;  Laterality: N/A;   UPPER GI ENDOSCOPY  07/02/2011   Gastritis; Dr Carlean Purl   VASECTOMY     wireless pacemaker placed       Current Outpatient Medications  Medication Sig Dispense Refill   acetaminophen (TYLENOL) 325 MG tablet Take 650 mg by mouth at bedtime as needed for moderate pain or headache.     allopurinol (ZYLOPRIM) 300 MG tablet Take 450 mg by mouth daily.     aluminum hydroxide-magnesium carbonate (GAVISCON) 95-358 MG/15ML SUSP Take 15 mLs by mouth as needed for indigestion or heartburn.     apixaban (ELIQUIS) 2.5 MG TABS tablet Take 1 tablet (2.5 mg total) by mouth 2 (two) times daily. 180 tablet 0   Ascorbic Acid (VITAMIN C PO) Take 1 tablet by mouth daily with breakfast.     atorvastatin (LIPITOR) 80 MG tablet TAKE 1 TABLET BY MOUTH EVERYDAY AT BEDTIME (Patient taking differently: Take 80 mg by mouth at bedtime.) 90 tablet 3   azelastine (ASTELIN) 0.1 % nasal spray PLACE 2 SPRAYS INTO BOTH NOSTRILS AT BEDTIME AS NEEDED FOR RHINITIS OR ALLERGIES. 30 mL 4   Coenzyme Q10 (CO Q-10 PO) Take 1 capsule by mouth daily.     esomeprazole (NEXIUM) 40 MG capsule Take 1 capsule (40 mg total) by mouth daily. (Patient taking differently: Take 40 mg by mouth daily before breakfast.) 30 capsule 5   ezetimibe (ZETIA) 10 MG tablet TAKE 1 TABLET BY MOUTH EVERY DAY (Patient taking differently: Take 10 mg by mouth daily.) 90 tablet 3   FARXIGA 10 MG TABS tablet Take 10 mg by mouth in the morning.     fenofibrate 160 MG tablet TAKE 1 TABLET BY MOUTH EVERY DAY (Patient taking differently: Take 160 mg by mouth daily.) 90 tablet 0   folic acid (FOLVITE) 1 MG tablet TAKE 2 TABLETS BY MOUTH EVERY DAY (Patient taking differently: Take 2 mg by mouth in the morning.) 180 tablet 4   FREESTYLE LITE test strip USE TO TEST BLOOD SUGAR ONCE A DAY. DX CODE: E11.9 100 strip 12   icosapent Ethyl (VASCEPA) 1 g capsule TAKE 2 CAPSULES BY MOUTH TWICE A DAY (Patient taking differently: Take 2 g by  mouth in the morning and at bedtime.) 360 capsule 1   Lancets (FREESTYLE) lancets USE ONCE A DAY  TO CHECK BLOOD SUGAR. 100 each 12   Menthol-Camphor (ICY HOT ADVANCED PAIN RELIEF EX) Apply 1 application  topically daily as needed (to painful sites).     metoprolol succinate (TOPROL-XL) 25 MG 24 hr tablet Take 1 tablet (25 mg total) by mouth daily. 30 tablet 1   nitroGLYCERIN (NITROSTAT) 0.4 MG SL tablet PLACE 1 TABLET (0.4 MG TOTAL) UNDER THE TONGUE EVERY 5 (FIVE) MINUTES AS NEEDED FOR CHEST PAIN. 25 tablet 6   potassium chloride SA (KLOR-CON M20) 20 MEQ tablet Take 2 tablets (40 mEq total) by mouth daily. 60 tablet 0   pregabalin (LYRICA) 150 MG capsule Take 1 capsule (150 mg total) by mouth 2 (two) times daily. (Patient taking differently: Take 150 mg by mouth in the morning and at bedtime.) 180 capsule 1   psyllium (METAMUCIL) 58.6 % powder Take 1 packet by mouth daily as needed (for constipation- mix and drink).     senna (SENOKOT) 8.6 MG TABS tablet Take 2 tablets (17.2 mg total) by mouth at bedtime. This is for constipation.  Stop taking if you start having diarrhea. 20 tablet 0   torsemide (DEMADEX) 20 MG tablet Take 2 tablets (40 mg total) by mouth in the morning. Do not take on 01/24/2022. 60 tablet 0   No current facility-administered medications for this visit.    Allergies:   Ace inhibitors, Benazepril, Entresto [sacubitril-valsartan], Hctz [hydrochlorothiazide], and Aspirin   ROS:  Please see the history of present illness.   Otherwise, review of systems are positive none.   All other systems are reviewed and negative.    PHYSICAL EXAM: VS:  There were no vitals taken for this visit. , BMI There is no height or weight on file to calculate BMI. GENERAL:  Well appearing NECK:  No jugular venous distention, waveform within normal limits, carotid upstroke brisk and symmetric, no bruits, no thyromegaly LUNGS:  Clear to auscultation bilaterally CHEST:  Unremarkable HEART:  PMI not  displaced or sustained,S1 and S2 within normal limits, no S3, no S4, no clicks, no rubs, soft apical systolic nonradiating murmur, no diastolic murmurs ABD:  Flat, positive bowel sounds normal in frequency in pitch, no bruits, no rebound, no guarding, no midline pulsatile mass, no hepatomegaly, no splenomegaly EXT:  2 plus pulses throughout, no edema, no cyanosis no clubbing   EKG:  EKG is not ordered today.    Recent Labs: 01/15/2022: Pro B Natriuretic peptide (BNP) 1,211.0 01/17/2022: B Natriuretic Peptide 969.2 01/23/2022: Magnesium 3.0 01/28/2022: ALT 26; BUN 41; Creatinine 1.93; Hemoglobin 12.4; Platelet Count 177; Potassium 4.4; Sodium 142    Lipid Panel    Component Value Date/Time   CHOL 138 01/25/2022 1218   CHOL 162 11/23/2020 0828   TRIG 164.0 (H) 01/25/2022 1218   HDL 28.90 (L) 01/25/2022 1218   HDL 38 (L) 11/23/2020 0828   CHOLHDL 5 01/25/2022 1218   VLDL 32.8 01/25/2022 1218   LDLCALC 76 01/25/2022 1218   LDLCALC 89 11/23/2020 0828   LDLCALC  04/18/2020 0956     Comment:     . LDL cholesterol not calculated. Triglyceride levels greater than 400 mg/dL invalidate calculated LDL results. . Reference range: <100 . Desirable range <100 mg/dL for primary prevention;   <70 mg/dL for patients with CHD or diabetic patients  with > or = 2 CHD risk factors. [name] LDL-C is now calculated using the Martin-Hopkins  calculation, which is a validated novel method providing  better accuracy than the Friedewald equation in the  estimation of LDL-C.  [name] et al. [name]. 3734;287(68): 2061-2068  (http://education.[employer].com/faq/FAQ164)    LDLDIRECT 58.0 07/26/2021 0912      Wt Readings from Last 3 Encounters:  01/28/22 230 lb (104.3 kg)  01/25/22 233 lb 3.2 oz (105.8 kg)  01/23/22 230 lb (104.3 kg)      Other studies Reviewed: Additional studies/ records that were reviewed today include: Hospital records Review of the above records demonstrates:  Please see  elsewhere in the note.     ASSESSMENT AND PLAN:    S/p TAVR: He had a normally functioning TAVR on echo in July 2023.  This is stable appearance.  CAD s/p CABG:    The patient's having increasing dyspnea but no evidence of ischemia or infarct on the study.  Continue with anti-ischemic therapies.   2-1 AV block s/p PPM:   Some of his reduced ejection fraction could be related to the leadless pacemaker.  However, we do not want to traditional pacing.   Atrial flutter:   He tolerates anticoagulation.  Mr. LAM MCCUBBINS has a CHA2DS2 - VASc score of 5.  Hypertension:  This is being managed in the context of treating his CHF  Hyperlipidemia:   LDL is 69.  No change in therapy.  DM 2:  A1c was 6.6.    Subclavian stenosis:  Left status post angioplasty.   Carotid stenosis:   No further imaging is indicated.   Sleep apnea:    He wears CPAP.      He wears CPAP.  CKD IIIa: Creatinine was 1.93.  I will check a BMET today.    Cardiomyopathy:    Unfortunately, he cannot use Entresto because he has had anaphylaxis to ACE inhibitor and I confirmed this.  Continue current therapy.   Current medicines are reviewed at length with the patient today.  The patient does not have concerns regarding medicines.  The following changes have been made: None  Labs/ tests ordered today include:    No orders of the defined types were placed in this encounter.    Disposition:   FU with Doreene Adas PhD, PAc in 3 months   Signed, Minus Breeding, MD  02/08/2022 7:48 AM    [place]

## 2022-02-08 ENCOUNTER — Other Ambulatory Visit: Payer: Self-pay | Admitting: Family Medicine

## 2022-02-08 ENCOUNTER — Encounter: Payer: Self-pay | Admitting: Cardiology

## 2022-02-08 ENCOUNTER — Ambulatory Visit (INDEPENDENT_AMBULATORY_CARE_PROVIDER_SITE_OTHER): Payer: Medicare Other | Admitting: Cardiology

## 2022-02-08 VITALS — BP 108/62 | HR 82 | Wt 232.2 lb

## 2022-02-08 DIAGNOSIS — E785 Hyperlipidemia, unspecified: Secondary | ICD-10-CM

## 2022-02-08 DIAGNOSIS — Z79899 Other long term (current) drug therapy: Secondary | ICD-10-CM

## 2022-02-08 DIAGNOSIS — I10 Essential (primary) hypertension: Secondary | ICD-10-CM | POA: Diagnosis not present

## 2022-02-08 DIAGNOSIS — I5042 Chronic combined systolic (congestive) and diastolic (congestive) heart failure: Secondary | ICD-10-CM

## 2022-02-08 DIAGNOSIS — Z952 Presence of prosthetic heart valve: Secondary | ICD-10-CM | POA: Diagnosis not present

## 2022-02-08 DIAGNOSIS — I483 Typical atrial flutter: Secondary | ICD-10-CM | POA: Diagnosis not present

## 2022-02-08 DIAGNOSIS — I871 Compression of vein: Secondary | ICD-10-CM | POA: Diagnosis not present

## 2022-02-08 DIAGNOSIS — E118 Type 2 diabetes mellitus with unspecified complications: Secondary | ICD-10-CM

## 2022-02-08 DIAGNOSIS — I25810 Atherosclerosis of coronary artery bypass graft(s) without angina pectoris: Secondary | ICD-10-CM | POA: Diagnosis not present

## 2022-02-08 LAB — BASIC METABOLIC PANEL
BUN/Creatinine Ratio: 25 — ABNORMAL HIGH (ref 10–24)
BUN: 46 mg/dL — ABNORMAL HIGH (ref 8–27)
CO2: 22 mmol/L (ref 20–29)
Calcium: 9.6 mg/dL (ref 8.6–10.2)
Chloride: 101 mmol/L (ref 96–106)
Creatinine, Ser: 1.86 mg/dL — ABNORMAL HIGH (ref 0.76–1.27)
Glucose: 258 mg/dL — ABNORMAL HIGH (ref 70–99)
Potassium: 4.8 mmol/L (ref 3.5–5.2)
Sodium: 138 mmol/L (ref 134–144)
eGFR: 35 mL/min/{1.73_m2} — ABNORMAL LOW (ref >59)

## 2022-02-08 MED ORDER — METOPROLOL SUCCINATE ER 25 MG PO TB24: 25.0000 mg | ORAL_TABLET | Freq: Every day | ORAL | 3 refills | Status: DC

## 2022-02-08 NOTE — Patient Instructions (Signed)
Medication Instructions:  Your Physician recommend you continue on your current medication as directed.    *If you need a refill on your cardiac medications before your next appointment, please call your pharmacy*   Lab Work: Your physician recommends lab work today (BMP)  If you have labs (blood work) drawn today and your tests are completely normal, you will receive your results only by: MyChart Message (if you have MyChart) OR A paper copy in the mail If you have any lab test that is abnormal or we need to change your treatment, we will call you to review the results.   Testing/Procedures: None ordered today   Follow-Up: At [facility], you and your health needs are our priority.  As part of our continuing mission to provide you with exceptional heart care, we have created designated Provider Care Teams.  These Care Teams include your primary Cardiologist (physician) and Advanced Practice Providers (APPs -  Physician Assistants and Nurse Practitioners) who all work together to provide you with the care you need, when you need it.  We recommend signing up for the patient portal called "MyChart".  Sign up information is provided on this After Visit Summary.  MyChart is used to connect with patients for Virtual Visits (Telemedicine).  Patients are able to view lab/test results, encounter notes, upcoming appointments, etc.  Non-urgent messages can be sent to your provider as well.   To learn more about what you can do with MyChart, go to [email or web address].    Your next appointment:   3 month(s)  The format for your next appointment:   In Person  Provider: Doreene Adas, PA

## 2022-02-09 MED ORDER — GLIMEPIRIDE 1 MG PO TABS: 1.0000 mg | ORAL_TABLET | Freq: Every day | ORAL | 2 refills | Status: DC

## 2022-02-11 ENCOUNTER — Ambulatory Visit (INDEPENDENT_AMBULATORY_CARE_PROVIDER_SITE_OTHER): Payer: Medicare Other | Admitting: Pharmacist

## 2022-02-11 DIAGNOSIS — E1169 Type 2 diabetes mellitus with other specified complication: Secondary | ICD-10-CM

## 2022-02-11 DIAGNOSIS — I25118 Atherosclerotic heart disease of native coronary artery with other forms of angina pectoris: Secondary | ICD-10-CM

## 2022-02-11 DIAGNOSIS — N183 Chronic kidney disease, stage 3 unspecified: Secondary | ICD-10-CM

## 2022-02-11 DIAGNOSIS — I10 Essential (primary) hypertension: Secondary | ICD-10-CM

## 2022-02-11 NOTE — Patient Instructions (Signed)
[name] It was a pleasure speaking with you today.  Below is a summary of your health goals and summary of our recent visit. You can also view your updated Chronic Care Management Care plan through your MyChart account.    Patient Goals/Self-Care Activities take medications as prescribed check glucose daily, document, and provide at future appointments check blood pressure 2 to 3 times daily, document, and provide at future appointments target a minimum of 150 minutes of moderate intensity exercise weekly (once shortness of breath improves) Change pregabalin to take '150mg'$  at noon and '150mg'$  at night.  Change metoprolol succinate - take '25mg'$  at night.  Increase glimepiride '1mg'$  - take 1.5 tablets once each morning.   Follow Up Plan: The care management team will reach out to the patient again over the next 14 days.   As always if you have any questions or concerns especially regarding medications, please feel free to contact me either at the phone number below or with a MyChart message.   Keep up the good work!  [name], PharmD Clinical Pharmacist [facility] [place] [phone] (direct line)  [phone] (main office number)   The patient verbalized understanding of instructions, educational materials, and care plan provided today and agreed to receive a mailed copy of patient instructions, educational materials, and care plan.

## 2022-02-11 NOTE — Chronic Care Management (AMB) (Signed)
Chronic Care Management Pharmacy Note  02/11/2022 Name:  FARLEY CROOKER MRN:  [id number] DOB:  20-Nov-1940  Summary: Diabetes - Blood glucose has increased since metformin was stopped due to elevated eGFR 12/2021. Patient has restarted glimepiride 70m daily with blood glucose 130's to 170's. Continues to take Farxiga 129mdaily for blood glucose and CHF. Due to low eGFR, FaWilder Glades likely not as effective for lowering blood glucose. Last A1c was 6.6%. Recommended increase glimepiride to 1.70m24maily. Will try to tget blood glucose a little lower but would be OK if A1c goal is < 7.5% and we prevent hypoglycemia. Reviewed how to treat hypoglycemia with patient .  CHF - Patient to continue to weigh daily each morning.  Discussed signs and symptoms of CHF exacerbation - weight gain, SOB, abdominal fullness, swelling in legs or abdomen, Fatigue and weakness, changes in ability to perform usual activities, persistent cough or wheezing with white or pink blood-tinged mucus, nausea and lack of appetite. Report weight gain of more than 3 lbs in 24 hours or 5 lbs in 1 week.   Continue torsemide 76m58mily, Farxiga 10mg22mly and metoprolol succinate 270mg 52my  Daytime drowsiness- Patient reports being sleepy in the morning especially Sunday mornings. He thinks it could be metoprolol succinate 270mg d83m but I think could be pregabalin 150mg bi70mRecommended he move metoprolol ER 50mg to 73m each evening. Also recommended trial of pregabalin 150mg afte770mon and evening. Also discussed lowering dose of pregabalin to 100mg but p570mnt wanted to try taking later in the day instead.    Subjective: Darious L ForRAYMEL CULL.o. yea72old male who is a primary patient of Lowne ChaseAnn HeldCCM team was consulted for assistance with disease management and care coordination needs.    Engaged with patient by telephone for follow up visit in response to provider referral for pharmacy case management and/or  care coordination services.   Consent to Services:  The patient was given information about Chronic Care Management services, agreed to services, and gave verbal consent prior to initiation of services.  Please see initial visit note for detailed documentation.   Patient Care Team: Lowne ChaseCarollee Herter DAlferd Apa - General (Family Medicine) Hochrein, JMinus Breeding - Cardiology (Cardiology) Klein, StevDeboraha Sprang - Electrophysiology (Cardiology) Dingeldein, Steven, MD Remo Lippsalmology) Alva, RakesRigoberto Noelsulting Physician (Pulmonary Disease) Ennever, PeVolanda Napoleonsulting Physician (Oncology) Hochrein, JMinus Breedingsulting Physician (Cardiology) Parrett, Alcide Memoli S, NPFonnie Muse Practitioner (Pulmonary Disease) [facility], [place] [facility] (Dermatology) Gioffre, RoLatanya Maudlinsulting Physician (Orthopedic Surgery) Kidney, [place] EcInez PilgrimPhBrunsvillet) Singh, HarmMurlean Ibaology) Miller, MicNevada Craneerring Physician (Radiology)  Recent office visits: 01/25/2022 - Fam Med (Dr Lowne- ChasCarollee Herterfollow up. Labs checked. Glimepiride 1mg restart51m  11/16/2021 - Fam Med (Dr LowneChase) Jason Nesttrep throat. Prescribed amoxicillin 8770mg twice a370m 09/14/2021 - Fam Med (Dr Lowne- Chase)Etter SjogrenCheri Rous - COVID. Prescribed molnupiravir and prednisone.   Recent consult visits: 02/08/2022 - Cardio (Dr Hocherin) SeeJenkins RougeF. No med changes noted.  01/28/2022 - Hem/Onc ([name], NP) [name] Post large B-cell lymphoma (not "double hit" lymphoma) and anemia. Completed treatment in February 2019.  Continue B 12 injection and Xgeva today as planned. Iron studies pending. Follow-up in 1 month.  01/15/2022 - Pulm (Cobb, NP) Seen for Hemoptysis, OSA and acute on chronic CHF. Noted fluid overload, Checked CXR  which showed persistent pulmonary edema. BMET showed decreased in renal function and BNP elevated at 1211. Recommended patient go to ED. 01/08/2022 - Pulm (Dr  Elsworth Soho) increased torsemide to 40mg  daily. Recheck BMP in 2 days. Considering addition of metolazone if weight does not decrease by at least 5 lbs over the next 2 to 3 days.  01/07/2022 - Cardio ([facility] Juanita Laster, NP) F/U 91 day postop revision of pacemaker w/medtronic Micra AV leadless pacemaker 08/2021. Scheduled remote device checks. No med changes noted. F/U 3 to 4 months.   11/29/2021- Cardio [place], [facility]) Work in for SOB. Chest x-ray form 11/28/2021 showed pulomonary edema. Suspect DOE is likely multifactorial - low iron deconditioning, worsening valvular disease +/- CAD. Recommended torsemide 40mg  daily for 3 days , then resume usual. Potassium 88mEq in am and 40 in pm for 3 days, then usual dose. 11/23/2021 - Cardio (Dr Jenkins Rouge) seen for follow up TAVR / CAD and atrial flutter. No med changes noted. F/U 1 year. 10/16/2021 - Radiology ([place] - Dr Sabra Heck) Venogram with balloon angioplasty scheduled.  09/25/2021 - Hem/Onc [name] Post, NP) F/U lymphona and anemia. Received B12 injection and Ferahema. Iron studies checked. F/U 1 month 09/18/2021 - Radiology (Dr Illene Labrador [facility]) F/U post balloon dilatation of the left subclavian vein following removal of pacemaker leads. F/U 1 month.  Hospital visits: 01/16/2022 - 01/23/2022 - Hospital admission for CHF. Treated with IV furosemide until BUN and creatinine increased. Duiretic held for a day, then torsemide 40mg  started 7/25, held 07/27, then told to restart 07/28.  Medications stopped at discharge: amlodipine 5mg , glimepiride 1mg , metformin 500mg  and metoprolol tartrate 25mg  Medications started at discharge: metoprolol succinate 25mg  daily; potassium chloride 65mEq - 2 tablets daily.  Medication changes at discharge: torsemide 20mg  - take 2 tablets = 40mg  each morning. 08/20/2021 - removal of permanent pacemaker and leads from left side. Inplantation leadless pacemaker (Micra) 08/13/2021 - Radiology (Dr Loreta Ave) Port  removal   Objective:  Lab Results  Component Value Date   CREATININE 1.86 (H) 02/08/2022   CREATININE 1.93 (H) 01/28/2022   CREATININE 1.83 (H) 01/25/2022    Lab Results  Component Value Date   HGBA1C 6.6 (H) 01/25/2022   Last diabetic Eye exam:  Lab Results  Component Value Date/Time   HMDIABEYEEXA No Retinopathy 04/05/2021 12:00 AM    Last diabetic Foot exam: No results found for: "HMDIABFOOTEX"      Component Value Date/Time   CHOL 138 01/25/2022 1218   CHOL 162 11/23/2020 0828   TRIG 164.0 (H) 01/25/2022 1218   HDL 28.90 (L) 01/25/2022 1218   HDL 38 (L) 11/23/2020 0828   CHOLHDL 5 01/25/2022 1218   VLDL 32.8 01/25/2022 1218   LDLCALC 76 01/25/2022 1218   [place] 89 11/23/2020 0828   [place]  04/18/2020 0956     Comment:     . LDL cholesterol not calculated. Triglyceride levels greater than 400 mg/dL invalidate calculated LDL results. . Reference range: <100 . Desirable range <100 mg/dL for primary prevention;   <70 mg/dL for patients with CHD or diabetic patients  with > or = 2 CHD risk factors. [name] LDL-C is now calculated using the Martin-Hopkins  calculation, which is a validated novel method providing  better accuracy than the Friedewald equation in the  estimation of LDL-C.  [name] et al. [name]. 6378;588(50): 2061-2068  (http://education.[employer].com/faq/FAQ164)    LDLDIRECT 58.0 07/26/2021 0912       Latest Ref Rng & Units 01/28/2022    1:55  PM 01/25/2022   12:18 PM 01/16/2022   12:02 PM  Hepatic Function  Total Protein 6.5 - 8.1 g/dL 6.5  7.1  7.4   Albumin 3.5 - 5.0 g/dL 3.3  4.7  4.3   AST 15 - 41 U/L 27  26  18    ALT 0 - 44 U/L 26  29  17    Alk Phosphatase 38 - 126 U/L 65  82  77   Total Bilirubin 0.3 - 1.2 mg/dL 0.6  1.0  1.1     Lab Results  Component Value Date/Time   TSH 2.450 07/27/2018 03:24 PM   TSH 2.101 08/18/2017 06:08 AM   TSH 2.53 07/28/2015 10:04 AM   FREET4 0.87 06/21/2014 04:45 PM       Latest Ref Rng &  Units 01/28/2022    1:55 PM 01/22/2022    6:49 AM 01/21/2022    4:53 AM  CBC  WBC 4.0 - 10.5 K/uL 7.0  8.2  7.6   Hemoglobin 13.0 - 17.0 g/dL 12.4  13.6  13.4   Hematocrit 39.0 - 52.0 % 40.1  43.9  42.4   Platelets 150 - 400 K/uL 177  212  177     Lab Results  Component Value Date/Time   VD25OH 16.1 (L) 07/27/2018 03:24 PM    Clinical ASCVD: Yes  The ASCVD Risk score (Arnett DK, et al., 2019) failed to calculate for the following reasons:   The 2019 ASCVD risk score is only valid for ages 22 to 31   The patient has a prior MI or stroke diagnosis     Social History   Tobacco Use  Smoking Status Never  Smokeless Tobacco Never   BP Readings from Last 3 Encounters:  02/08/22 108/62  01/28/22 124/66  01/25/22 104/68   Pulse Readings from Last 3 Encounters:  02/08/22 82  01/28/22 77  01/25/22 84   Wt Readings from Last 3 Encounters:  02/08/22 232 lb 3.2 oz (105.3 kg)  01/28/22 230 lb (104.3 kg)  01/25/22 233 lb 3.2 oz (105.8 kg)    Assessment: Review of patient past medical history, allergies, medications, health status, including review of consultants reports, laboratory and other test data, was performed as part of comprehensive evaluation and provision of chronic care management services.   SDOH:  (Social Determinants of Health) assessments and interventions performed:  SDOH Interventions    Flowsheet Row Most Recent Value  SDOH Interventions   Financial Strain Interventions Other (Comment)  Physical Activity Interventions Intervention Not Indicated  [patient has been walking on treadmill a little - plans to increase as able]       CCM Care Plan  Allergies  Allergen Reactions   Ace Inhibitors Swelling and Other (See Comments)    Angioedema    Benazepril Swelling and Other (See Comments)    Angioedema; he is not a candidate for any angiotensin receptor blockers because of this significant allergic reaction. Because of a history of documented adverse serious  drug reaction;Medi Alert bracelet  is recommended   Entresto [Sacubitril-Valsartan] Swelling and Other (See Comments)    First-in-Class Angiotensin Receptor Neprilysin Inhibitor- Med was "red-flagged" by the patient's pharmacy for him to NOT take!   Hctz [Hydrochlorothiazide] Anaphylaxis and Swelling    Tongue and lip swelling    Aspirin Other (See Comments)    Gastritis, can aspirin not take 325 mg aspirin    Medications Reviewed Today     Reviewed by [name], RPH-CPP (Pharmacist) on 02/11/22 at 1351  Med List Status: <None>   Medication Order Taking? Sig Documenting Provider Last Dose Status Informant  acetaminophen (TYLENOL) 325 MG tablet [id number] Yes Take 650 mg by mouth at bedtime as needed for moderate pain or headache. [provider] Taking Active Self  allopurinol (ZYLOPRIM) 300 MG tablet [id number] Yes Take 450 mg by mouth daily. [provider] Taking Active Self           Med Note [name], [name]   Wed Jan 16, 2022  2:57 PM) 1.5 tablets = 450 mg  aluminum hydroxide-magnesium carbonate (GAVISCON) 95-358 MG/15ML SUSP [id number]  Take 15 mLs by mouth as needed for indigestion or heartburn. [provider]  Active Self  apixaban (ELIQUIS) 2.5 MG TABS tablet [id number] Yes Take 1 tablet (2.5 mg total) by mouth 2 (two) times daily. Minus Breeding, MD Taking Active   Ascorbic Acid (VITAMIN C PO) [id number] Yes Take 1 tablet by mouth daily with breakfast. [provider] Taking Active   atorvastatin (LIPITOR) 80 MG tablet [id number] Yes TAKE 1 TABLET BY MOUTH EVERYDAY AT BEDTIME  Patient taking differently: Take 80 mg by mouth at bedtime.   Minus Breeding, MD Taking Active   azelastine (ASTELIN) 0.1 % nasal spray [id number] Yes PLACE 2 SPRAYS INTO BOTH NOSTRILS AT BEDTIME AS NEEDED FOR RHINITIS OR ALLERGIES. Roma Schanz R, DO Taking Active   Coenzyme Q10 (CO Q-10 PO) [id number] Yes Take 1 capsule by mouth daily. [provider] Taking  Active   esomeprazole (NEXIUM) 40 MG capsule [id number] Yes Take 1 capsule (40 mg total) by mouth daily.  Patient taking differently: Take 40 mg by mouth daily before breakfast.   [name], MD Taking Active Self  ezetimibe (ZETIA) 10 MG tablet [id number] Yes TAKE 1 TABLET BY MOUTH EVERY DAY Minus Breeding, MD Taking Active   FARXIGA 10 MG TABS tablet [id number] Yes Take 10 mg by mouth in the morning. [provider] Taking Active   fenofibrate 160 MG tablet [id number] Yes TAKE 1 TABLET BY MOUTH EVERY DAY Carollee Herter, Alferd Apa, DO Taking Active   folic acid (FOLVITE) 1 MG tablet [id number] Yes TAKE 2 TABLETS BY MOUTH EVERY DAY  Patient taking differently: Take 2 mg by mouth in the morning.   [name], MD Taking Active   FREESTYLE LITE test strip [id number] Yes USE TO TEST BLOOD SUGAR ONCE A DAY. DX CODE: E11.9 [name], DO Taking Active   glimepiride (AMARYL) 1 MG tablet [id number] Yes Take 1 tablet (1 mg total) by mouth daily with breakfast. Carollee Herter, Alferd Apa, DO Taking Active   icosapent Ethyl (VASCEPA) 1 g capsule [id number] Yes TAKE 2 CAPSULES BY MOUTH TWICE A DAY  Patient taking differently: Take 2 g by mouth in the morning and at bedtime.   Carollee Herter, Alferd Apa, DO Taking Active   Lancets (FREESTYLE) lancets [id number] Yes USE ONCE A DAY TO CHECK BLOOD SUGAR. [name], DO Taking Active   Menthol-Camphor (ICY HOT ADVANCED PAIN RELIEF EX) [id number] Yes Apply 1 application  topically daily as needed (to painful sites). [provider] Taking Active Self  metoprolol succinate (TOPROL-XL) 25 MG 24 hr tablet [id number] Yes Take 1 tablet (25 mg total) by mouth daily. Minus Breeding, MD Taking Active   nitroGLYCERIN (NITROSTAT) 0.4 MG SL tablet [id number] Yes PLACE 1 TABLET (0.4 MG TOTAL) UNDER THE TONGUE EVERY 5 (FIVE) MINUTES AS NEEDED FOR CHEST PAIN. Minus Breeding, MD Taking Active  potassium chloride SA (KLOR-CON M20) 20 MEQ tablet  [id number] Yes Take 2 tablets (40 mEq total) by mouth daily. Modena Jansky, MD Taking Active   pregabalin (LYRICA) 150 MG capsule [id number] Yes Take 1 capsule (150 mg total) by mouth 2 (two) times daily.  Patient taking differently: Take 150 mg by mouth in the morning and at bedtime.   Roma Schanz R, DO Taking Active   psyllium (METAMUCIL) 58.6 % powder [id number] Yes Take 1 packet by mouth daily as needed (for constipation- mix and drink). [provider] Taking Active Self  senna (SENOKOT) 8.6 MG TABS tablet [id number] Yes Take 2 tablets (17.2 mg total) by mouth at bedtime. This is for constipation.  Stop taking if you start having diarrhea. Modena Jansky, MD Taking Active   torsemide (DEMADEX) 20 MG tablet [id number] Yes Take 2 tablets (40 mg total) by mouth in the morning. Do not take on 01/24/2022. Modena Jansky, MD Taking Active   Med List Note Henderson Newcomer, CPhT 06/10/13 9169): cpap machine 2 L of oxygen at night            Patient Active Problem List   Diagnosis Date Noted   Chronic combined systolic and diastolic heart failure ([place]) 02/06/2022   Acute exacerbation of CHF (congestive heart failure) ([place]) 01/16/2022   Hemoptysis 12/26/2021   Subclavian vein stenosis 12/23/2021   Strep throat 11/16/2021   Multiple superficial wounds with infection 01/04/2021   Cardiac pacemaker in situ 11/20/2020   S/P CABG x 4 11/20/2020   Ascending aortic aneurysm ([place]) 11/20/2020   Senile purpura ([place]) 07/27/2020   Porokeratosis 06/20/2020   Pain due to onychomycosis of toenails of both feet 12/14/2019   Educated about COVID-19 virus infection 11/25/2019   Uncontrolled type 2 diabetes mellitus with hypoglycemia without coma ([place]) 08/10/2019   Hyperlipidemia associated with type 2 diabetes mellitus ([place]) 08/10/2019   S/P left TKA 06/29/2019   Peripheral neuropathy 07/27/2018   Uncontrolled type 2 diabetes mellitus with hyperglycemia ([place]) 05/13/2018   Diabetic  peripheral neuropathy associated with type 2 diabetes mellitus ([place]) 05/13/2018   Pansinusitis 05/13/2018   Severe aortic stenosis    Complete heart block (HCC)    Paroxysmal atrial fibrillation (HCC)    Acute on chronic diastolic CHF (congestive heart failure), NYHA class 2 (HCC)    Left arm swelling    Status post transcatheter aortic valve replacement (TAVR) using bioprosthesis 09/09/2017   Demand ischemia of myocardium (HCC)    Typical atrial flutter (HCC)    Acute on chronic diastolic heart failure (HCC)    GIB (gastrointestinal bleeding) 08/16/2017   Hyperlipidemia 08/05/2017   NSTEMI (non-ST elevated myocardial infarction) ([place])    Aortic stenosis, severe 07/25/2017   Pancytopenia ([place]) 07/24/2017   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 ([place]) 02/27/2017   Bradycardia 01/04/2017   Coronary artery disease 01/04/2017   Stenosis of carotid artery 01/04/2017   Obesity 09/18/2016   Wenckebach block    Disorder associated with type II diabetes mellitus ([place]) 05/21/2013   Spinal stenosis of lumbar region 04/08/2013   Anemia, iron deficiency 11/29/2011   B12 deficiency anemia 07/30/2011   OSA (obstructive sleep apnea) 07/06/2010   CAD, ARTERY BYPASS GRAFT 08/22/2009   Essential hypertension 03/29/2009   Bilateral lower extremity edema 02/21/2009   Hyperlipidemia LDL goal <70 11/26/2008    Immunization History  Administered Date(s) Administered   Fluad Quad(high Dose 65+) 03/21/2021   Influenza Split 06/12/2011, 05/31/2013   Influenza  Whole 05/01/2012   Influenza, High Dose Seasonal PF 02/09/2015, 03/12/2017, 03/09/2018, 03/02/2020   Influenza-Unspecified 02/11/2014, 02/29/2016, 02/25/2019   Moderna Sars-Covid-2 Vaccination 08/30/2019, 09/30/2019, 04/21/2020   Pneumococcal Conjugate-13 09/18/2015   Pneumococcal Polysaccharide-23 06/20/2013   Td 03/20/2010, 03/16/2013, 09/29/2017   Zoster Recombinat (Shingrix) 11/21/2016, 09/25/2017    Conditions to be  addressed/monitored: Atrial Fibrillation, CHF, CAD, HTN, HLD, Hypertriglyceridemia, DMII, CKD Stage 3, and gout; neuropathy; h/o GIB; OSA with CPAP;   Care Plan : General Pharmacy (Adult)  Updates made by [name], RPH-CPP since 02/11/2022 12:00 AM     Problem: HTN, HLD, DM, HF   Priority: High  Onset Date: 09/13/2020     Long-Range Goal: Provide education, support and care coordination for medication therapy and chronic conditions   Start Date: 09/13/2020  Expected End Date: 03/16/2021  Recent Progress: On track  Priority: High  Note:   Current Barriers:  Unable to independently afford cost of medication (Farxiga, Eliquis)  Unable to maintain control of mixed hyperlipidemia (elevated LDL and triglycerides and low HDL) Changes in renal function  Pharmacist Clinical Goal(s):  Over the next 90 days, patient will achieve adherence to monitoring guidelines and medication adherence to achieve therapeutic efficacy maintain control of blood pressure and BG as evidenced by home monitoring.  contact provider office for questions/concerns as evidenced notation of same in electronic health record through collaboration with PharmD and provider.  Work to bring down TG's by dietary modifications, continued exercised, control of BG and taking medications as prescribed Review medications and make adjustments if needed based on renal function  Interventions: 1:1 collaboration with Carollee Herter, Alferd Apa, DO regarding development and update of comprehensive plan of care as evidenced by provider attestation and co-signature Inter-disciplinary care team collaboration (see longitudinal plan of care) Comprehensive medication review performed; medication list updated in electronic medical record  Hypertension (BP goal <130/80) Controlled Current treatment: Metoprolol succinate ER 66m daily  Torsemide 239m- take 2 tablets = 4039maily  Medications previously tried: benazepril (swelling), HCTZ  (anaphylaxis); amlodipine (worsened CHF)  Current home BP range SBP = 120 to 130 DBP = 65 to 70 Diet:  has been reducing intake of sweets and fried foods Also is chooseing leaner proteins and lower fat dairy. Has replaced sodas with sparkling water. Exercise: decreased recently due to shortness of breath but he has started walking a little on treadmill. Denies hypotensive/hypertensive symptoms Patient c/o drowsiness in the morning especially on Sundays during church. He is wondering if it is metoprolol. I suspect more likely is morning pregabalin dose.  Interventions:  Educated on blood pressure  goals and benefits of medications for prevention of heart attack, stroke and kidney damage; Recommended limit daily salt intake to < 2300 mg; Exercise goal of 150 minutes per week; Continue to check blood pressure  at home 2 to 3 times per week; document, and provide log at future appointments Recommended to trial of taking metoprolol succinate at night to see if daytime drowsiness improves.   Hyperlipidemia / CAD: (LDL goal < 70; Tg goal <150; HDL goal >40) Not at goal but triglycerides have improved from 839 to 164! LDL is close to goal Current treatment: Atorvastatin 71m87mily Ezetimibe 10mg55mly Fenofibrate 160mg 27my Vascepa 1g - take 2 capsules twice daily  Nitroglycerin 0.4mg as65meded Medications previously tried: none noted Interventions:  Continue dietary changes mentioned above under HTN Reviewed cholesterol goals;  Continue to exercise regularly with goal of at least 150 minutes per week Future  treatment options if LDL and Tg remain above goals - change atorvastatin to rosuvastatin 65m or add PCSK9 agent  Diabetes (A1c goal <7% but will accept < 7.5%) Controlled; Last A1c was 6.6%  Current medications: Glimepiride 134mdaily  Farxiga 1084maily Current home glucose readings range from 130's to 170's (decreased from 190's with adding back glimepiride 01/25/2022) Denies  hypoglycemic/hyperglycemic symptoms Screened for patient assistance program for Farxiga at past visit - patient did not meet 2023 patient assistance program income requirement. Current meal patterns: see above Current exercise: see above Recent decrease in renal function - metformin was discontinued during July 2023 hospitalization.  Interventions:  Educated on A1c and blood glucose goals Continue to exercise at least 150 minutes per week; Reminded to check feet daily and get yearly eye exam Recommended increase glimepiride to take 1.5 tablets = 1.5mg24mily   Heart Failure (Goal: manage symptoms and prevent exacerbations) Improving after last hospitalization 12/2021 Followed by cardiology Dr. HochPercival Spanisht ejection fraction: 30-35%  HF type: Diastolic / HFrEF Current treatment: Torsemide 20mg6mly - take 2 tablets = 40mg 55my  Potassium 20mEq 22mke 2 tablets = 40 mEq twice daily Metoprolol succinate 25mg - 53m 1 tablet daily  Farxiga 10mg dai13medications previously tried: furosemide (ineffective); ACE - angioedema;  Entresto - not started due to history of engioedema with ACE-inhibitor Interventions:  Educated on Benefits of medications for managing symptoms and prolonging life Recommended to continue current medication Discussed signs and symptoms of CHF exacerbation - weight gain, SOB, abdominal fullness, swelling in legs or abdomen, Fatigue and weakness, changes in ability to perform usual activities, persistent cough or wheezing with white or pink blood-tinged mucus, nausea and lack of appetite Continue to weigh daily - report weight gain of more than 3 lbs in 24 hours or 5 lbs in 1 week.  CKD - stage 3 (goal: slow pregression of  renal disease and adjust medications as needed based on renal function) Managed by nephrology - sees Dr Singh 09/Candiss Norsefor follow up Current treatment: Farxiga 10mg dail41mst eGFR was 35 Estimated CrCL = 25 - 35mL/min (76me based on using  adjusted and ideal body weight)  Lab Results  Component Value Date   CREATININE 1.86 (H) 02/08/2022   Wt Readings from Last 3 Encounters:  02/08/22 232 lb 3.2 oz (105.3 kg)  01/28/22 230 lb (104.3 kg)  01/25/22 233 lb 3.2 oz (105.8 kg)   Interventions:  Reviewed medications that might need adjustment based on renal function:  Eliquis - adjusted already based on renal function and age to 2.5mg twice a54my Pregabalin - CrCL 30 to 60 mL/min = max of 300mg/ day (p34mnt is taking 300mg / day) F23mibrate - recommended starting dose is 54mg daily and62must upward as needed monitoring for myalgia. Patient is on highest dose but Ok as long as monitoring for myalgias due to increased risk with lower renal function and concurrent treatment with statin.    Medication management Pharmacist Clinical Goal(s): Over the next 90 days, patient will work with PharmD and providers to maintain optimal medication adherence [facility] [place] Comprehensive medication review performed. Reviewed refill history and assessed adherence Continue current medication management strategy Patient self care activities - Over the next 90 days, patient will: Focus on medication adherence by filling medications appropriately  Take medications as prescribed Report any questions or concerns to PharmD and/or provider(s)  Patient Goals/Self-Care Activities Over the next 90 days, patient will:  take medications  as prescribed check glucose daily, document, and provide at future appointments check blood pressure 2 to 3 times daily, document, and provide at future appointments target a minimum of 150 minutes of moderate intensity exercise weekly (once shortness of breath improves) Change pregabalin to take 153m at noon and 1573mat night.  Change metoprolol succinate - take 2547mt night.  Increase glimepiride 1mg73mtake 1.5 tablets once each morning.   Follow Up Plan: The care management team  will reach out to the patient again over the next 14 days.         Medication Assistance:  assessed for Farxiga and Eliquis PAP -patient does not meet income requirement  [facility]'s [facility] [id number]: [phone]: [phone] Up:  Patient agrees to Care Plan and Follow-up.  Plan: Telephone follow up appointment with care management team member scheduled for:  2 weeks  TammCherre RobinsarmD Clinical Pharmacist [place]-[phone]

## 2022-02-15 DIAGNOSIS — I442 Atrioventricular block, complete: Secondary | ICD-10-CM | POA: Diagnosis not present

## 2022-02-15 DIAGNOSIS — Z45018 Encounter for adjustment and management of other part of cardiac pacemaker: Secondary | ICD-10-CM | POA: Diagnosis not present

## 2022-02-16 ENCOUNTER — Other Ambulatory Visit: Payer: Self-pay | Admitting: Family Medicine

## 2022-02-23 ENCOUNTER — Other Ambulatory Visit: Payer: Self-pay | Admitting: Family Medicine

## 2022-02-23 DIAGNOSIS — G609 Hereditary and idiopathic neuropathy, unspecified: Secondary | ICD-10-CM

## 2022-02-25 ENCOUNTER — Telehealth: Payer: Self-pay | Admitting: Family Medicine

## 2022-02-25 ENCOUNTER — Other Ambulatory Visit: Payer: Self-pay | Admitting: Family Medicine

## 2022-02-25 NOTE — Telephone Encounter (Signed)
Requesting: Lyrica '150mg'$   Contract: None UDS: None Last Visit: 01/25/22 Next Visit: None Last Refill: 08/16/21 #180 and 1RF  Please Advise

## 2022-02-25 NOTE — Telephone Encounter (Signed)
Duplicate request

## 2022-02-25 NOTE — Telephone Encounter (Signed)
Pt is completely out of medication and is hoping to pick it up by lunch.   Medication: pregabalin (LYRICA) 150 MG capsule   Has the patient contacted their pharmacy? Yes.     [facility] [place] [place] [facility] [address]   [place], [place] [address]  Phone:  [phone]  Fax:  [phone]

## 2022-02-25 NOTE — Telephone Encounter (Signed)
Pt called to follow up on refill status. He stated he is completely out and is very nervous if he manages to lapse on his medication. Pt would like to have this filled ASAP so he can resume taking it.

## 2022-02-26 ENCOUNTER — Ambulatory Visit: Payer: Medicare Other | Admitting: Pharmacist

## 2022-02-26 DIAGNOSIS — I10 Essential (primary) hypertension: Secondary | ICD-10-CM

## 2022-02-26 DIAGNOSIS — E1122 Type 2 diabetes mellitus with diabetic chronic kidney disease: Secondary | ICD-10-CM

## 2022-02-26 DIAGNOSIS — E1169 Type 2 diabetes mellitus with other specified complication: Secondary | ICD-10-CM

## 2022-02-26 DIAGNOSIS — I25118 Atherosclerotic heart disease of native coronary artery with other forms of angina pectoris: Secondary | ICD-10-CM

## 2022-02-26 DIAGNOSIS — N183 Chronic kidney disease, stage 3 unspecified: Secondary | ICD-10-CM

## 2022-02-26 DIAGNOSIS — Z45018 Encounter for adjustment and management of other part of cardiac pacemaker: Secondary | ICD-10-CM | POA: Diagnosis not present

## 2022-02-26 DIAGNOSIS — I5033 Acute on chronic diastolic (congestive) heart failure: Secondary | ICD-10-CM

## 2022-02-26 MED ORDER — GLIMEPIRIDE 2 MG PO TABS: 2.0000 mg | ORAL_TABLET | Freq: Every day | ORAL | 1 refills | Status: DC

## 2022-02-26 NOTE — Patient Instructions (Signed)
Mr. Sauceda It was a pleasure speaking with you today.  Below is a summary of your health goals and summary of our recent visit. You can also view your updated Chronic Care Management Care plan through your MyChart account.    Patient Goals/Self-Care Activities take medications as prescribed check glucose daily, document, and provide at future appointments check blood pressure 2 to 3 times daily, document, and provide at future appointments target a minimum of 150 minutes of moderate intensity exercise weekly (once shortness of breath improves) Increase glimepiride to '2mg'$  each morning.   Follow Up Plan: The care management team will reach out to the patient again over the next 30 days.   As always if you have any questions or concerns especially regarding medications, please feel free to contact me either at the phone number below or with a MyChart message.   Keep up the good work!  [name], PharmD Clinical Pharmacist [place] [phone] (direct line)  [phone] (main office number)   The patient verbalized understanding of instructions, educational materials, and care plan provided today and agreed to receive a mailed copy of patient instructions, educational materials, and care plan.

## 2022-02-26 NOTE — Chronic Care Management (AMB) (Signed)
Chronic Care Management Pharmacy Note  02/26/2022 Name:  Richard [name] MRN:  [id number] DOB:  07/21/1940  Summary: Diabetes - Blood glucose over the last 2 weeks has been: 157, 177, 166, 171, 158, 147, 182, 141, 158, 139, 171, 161. Two weeks ago increased glimepiride from 48m to 1.553mdaily. Metformin stopped due to elevated eGFR 12/2021. Continues to take Farxiga 1043maily for blood glucose and CHF. Due to low eGFR, FarWilder Glade likely not as effective for lowering blood glucose. Last A1c was 6.6%. Recommended increase glimepiride to 2mg68mily. Will try to get blood glucose a little lower but would be OK if A1c goal is < 7.5% and we prevent hypoglycemia. Reviewed how to treat hypoglycemia with patient .  CHF - Patient to continue to weigh daily each morning.  Weight has been between 225 and 229 over the last 2 weeks. Patient reports no shortness of breath and minimal edema. He is walking 10 minutes daily on treadmill and golfs every Monday, Wednesday and Friday. Reviewed signs and symptoms of CHF exacerbation - weight gain, SOB, abdominal fullness, swelling in legs or abdomen, Fatigue and weakness, changes in ability to perform usual activities, persistent cough or wheezing with white or pink blood-tinged mucus, nausea and lack of appetite. Report weight gain of more than 3 lbs in 24 hours or 5 lbs in 1 week.   Continue torsemide 40mg34mly, Farxiga 10mg 61my and metoprolol succinate 25mg d28m  Daytime drowsiness- Patient reports daytime drowsiness improved greatly by switching metoprolol to pm and taking pregabalin at noon and bedtime (instead of morning and bedtime).    Subjective: Richard [name] y.o.51ear old male who is a primary patient of Richard [name] was consulted for assistance with disease management and care coordination needs.    Engaged with patient by telephone for follow up visit in response to provider referral for pharmacy case management  and/or care coordination services.   Consent to Services:  The patient was given information about Chronic Care Management services, agreed to services, and gave verbal consent prior to initiation of services.  Please see initial visit note for detailed documentation.   Patient Care [name]: Richard [name] Apa PCP - General (Family Medicine) HochreiMinus Breeding PCP - Cardiology (Cardiology) Klein, Deboraha Sprang PCP - Electrophysiology (Cardiology) Dingeldein, Steven,Remo Lippsphthalmology) Alva, RRigoberto Noel Consulting Physician (Pulmonary Disease) EnneverVolanda Napoleon Consulting Physician (Oncology) HochreiMinus Breeding Consulting Physician (Cardiology) Parrett, Camry Theiss SFonnie Mu Nurse Practitioner (Pulmonary Disease) Center, [facility] (Dermatology) [name] Consulting Physician (Orthopedic Surgery) Kidney, CarolinInez PilgrimPPMount Vernonacist) Singh, [name]) Miller,[name] Referring Physician (Radiology)  Recent office visits: 01/25/2022 - Fam Med (Dr Richard- Carollee Hertertal follow up. Labs checked. Glimepiride 1mg res60mted.  11/16/2021 - Fam Med (Dr LowneChaJason Nestor strep throat. Prescribed amoxicillin 875mg twi26m day 09/14/2021 - Fam Med (Dr Richard- ChEtter SjogrenCheri [name] - COVID. Prescribed molnupiravir and prednisone.   Recent consult visits: 02/08/2022 - Cardio (Dr Hocherin)Jenkins Rouger CHF. No med changes noted.  01/28/2022 - Hem/Onc (Richard, Richard [name] large B-cell lymphoma (not "double hit" lymphoma) and anemia. Completed treatment in February 2019.  Continue B 12 injection and Xgeva today as planned. Iron studies pending. Follow-up in 1 month.  01/15/2022 - Pulm (Cobb, NP) Seen for Hemoptysis, OSA and acute on chronic CHF.  Noted fluid overload, Checked CXR which showed persistent pulmonary edema. BMET showed decreased in renal function and BNP elevated at 1211. Recommended patient go to ED. 01/08/2022 -  Pulm (Dr Elsworth Soho) increased torsemide to 101m daily. Recheck BMP in 2 days. Considering addition of metolazone if weight does not decrease by at least 5 lbs over the next 2 to 3 days.  01/07/2022 - [facility] F/U 91 day postop revision of pacemaker w/medtronic Micra AV leadless pacemaker 08/2021. Scheduled remote device checks. No med changes noted. F/U 3 to 4 months.   11/29/2021- [facility] ([facility] for SOB. Chest x-ray form 11/28/2021 showed pulomonary edema. Suspect DOE is likely multifactorial - low iron deconditioning, worsening valvular disease +/- CAD. Recommended torsemide 474mdaily for 3 days , then resume usual. Potassium 805min am and 40 in pm for 3 days, then usual dose. 11/23/2021 - Cardio (Dr HocJenkins Rougeeen for follow up TAVR / CAD and atrial flutter. No med changes noted. F/U 1 year. 10/16/2021 - Radiology ([facility] [name] Heckenogram with balloon angioplasty scheduled.  09/25/2021 - Hem/Onc (Richard [name]) F/U lymphona and anemia. Received B12 injection and Ferahema. Iron studies checked. F/U 1 month 09/18/2021 - Radiology ([facility] MilIllene [name] post balloon dilatation of the left subclavian vein following removal of pacemaker leads. F/U 1 month.  Hospital visits: 01/16/2022 - 01/23/2022 - Hospital admission for CHF. Treated with IV furosemide until BUN and creatinine increased. Duiretic held for a day, then torsemide 29m36marted 7/25, held 07/27, then told to restart 07/28.  Medications stopped at discharge: amlodipine 5mg,42mimepiride 1mg, 60mformin 500mg a60metoprolol tartrate 25mg Me65mtions started at discharge: metoprolol succinate 25mg dai14mpotassium chloride 20mEq - 213mlets daily.  Medication changes at discharge: torsemide 20mg - tak32mtablets = 29mg each m14mng. 08/20/2021 - removal of permanent pacemaker and leads from left side. Inplantation leadless pacemaker (Micra) 08/13/2021 - Radiology (Dr Milller) PorLoreta Aveval   Objective:  Lab Results  Component Value Date   CREATININE 1.86 (H) 02/08/2022   CREATININE 1.93 (H) 01/28/2022   CREATININE 1.83 (H) 01/25/2022    Lab Results  Component Value Date   HGBA1C 6.6 (H) 01/25/2022   Last diabetic Eye exam:  Lab Results  Component Value Date/Time   HMDIABEYEEXA No Retinopathy 04/05/2021 12:00 AM    Last diabetic Foot exam: No results found for: "HMDIABFOOTEX"      Component Value Date/Time   CHOL 138 01/25/2022 1218   CHOL 162 11/23/2020 0828   TRIG 164.0 (H) 01/25/2022 1218   HDL 28.90 (L) 01/25/2022 1218   HDL 38 (L) 11/23/2020 0828   CHOLHDL 5 01/25/2022 1218   VLDL 32.8 01/25/2022 1218   LDLCALC 76 01/25/2022 1218   LDLCALC 89 0[place]22 0828   LDLCALC  10/[place] 0956     Comment:     . LDL cholesterol not calculated. Triglyceride levels greater than 400 mg/dL invalidate calculated LDL results. . Reference range: <100 . Desirable range <100 mg/dL for primary prevention;   <70 mg/dL for patients with CHD or diabetic patients  with > or = 2 CHD risk factors. . LDL-C is nMarland Kitchenw calculated using the Martin-Hopkins  calculation, which is a validated novel method providing  better accuracy than the Friedewald equation in the  estimation of LDL-C.  Martin SS etCresenciano Genre. 2013;3[name]9)2353;614(43  (http://education.[employer].com/faq/FAQ164)    LDLDIRECT 58.0 07/26/2021 0912       Latest Ref Rng & Units  01/28/2022    1:55 PM 01/25/2022   12:18 PM 01/16/2022   12:02 PM  Hepatic Function  Total Protein 6.5 - 8.1 g/dL 6.5  7.1  7.4   Albumin 3.5 - 5.0 g/dL 3.3  4.7  4.3   AST 15 - 41 U/L 27  26  18    ALT 0 - 44 U/L 26  29  17    Alk Phosphatase 38 - 126 U/L 65  82  77   Total Bilirubin 0.3 - 1.2 mg/dL 0.6  1.0  1.1     Lab Results  Component Value Date/Time   TSH 2.450 07/27/2018 03:24 PM   TSH 2.101 08/18/2017 06:08 AM   TSH 2.53 07/28/2015 10:04 AM   FREET4 0.87 06/21/2014 04:45 PM       Latest Ref Rng &  Units 01/28/2022    1:55 PM 01/22/2022    6:49 AM 01/21/2022    4:53 AM  CBC  WBC 4.0 - 10.5 K/uL 7.0  8.2  7.6   Hemoglobin 13.0 - 17.0 g/dL 12.4  13.6  13.4   Hematocrit 39.0 - 52.0 % 40.1  43.9  42.4   Platelets 150 - 400 K/uL 177  212  177     Lab Results  Component Value Date/Time   VD25OH 16.1 (L) 07/27/2018 03:24 PM    Clinical ASCVD: Yes  The ASCVD Risk score (Arnett DK, et al., 2019) failed to calculate for the following reasons:   The 2019 ASCVD risk score is only valid for ages 83 to 74   The patient has a prior MI or stroke diagnosis     Social History   Tobacco Use  Smoking Status Never  Smokeless Tobacco Never   BP Readings from Last 3 Encounters:  02/08/22 108/62  01/28/22 124/66  01/25/22 104/68   Pulse Readings from Last 3 Encounters:  02/08/22 82  01/28/22 77  01/25/22 84   Wt Readings from Last 3 Encounters:  02/08/22 232 lb 3.2 oz (105.3 kg)  01/28/22 230 lb (104.3 kg)  01/25/22 233 lb 3.2 oz (105.8 kg)    Assessment: Review of patient past medical history, allergies, medications, health status, including review of consultants reports, laboratory and other test data, was performed as part of comprehensive evaluation and provision of chronic care management services.   SDOH:  (Social Determinants of Health) assessments and interventions performed:     CCM Care Plan  Allergies  Allergen Reactions   Ace Inhibitors Swelling and Other (See Comments)    Angioedema    Benazepril Swelling and Other (See Comments)    Angioedema; he is not a candidate for any angiotensin receptor blockers because of this significant allergic reaction. Because of a history of documented adverse serious drug reaction;Medi Alert bracelet  is recommended   Entresto [Sacubitril-Valsartan] Swelling and Other (See Comments)    First-in-Class Angiotensin Receptor Neprilysin Inhibitor- Med was "red-flagged" by the patient's pharmacy for him to NOT take!   Hctz  [Hydrochlorothiazide] Anaphylaxis and Swelling    Tongue and lip swelling    Aspirin Other (See Comments)    Gastritis, can aspirin not take 325 mg aspirin    Medications Reviewed Today     Reviewed by [name], RPH-CPP (Pharmacist) on 02/26/22 at 1526  Med List Status: <None>   Medication Order Taking? Sig Documenting Provider Last Dose Status Informant  acetaminophen (TYLENOL) 325 MG tablet [id number] Yes Take 650 mg by mouth at bedtime as needed for moderate pain or headache. [provider] Taking Active Self  allopurinol (ZYLOPRIM) 300 MG tablet [id number] Yes Take 450 mg by mouth daily. [provider] Taking Active Self           Med Note [name], [name]   Wed Jan 16, 2022  2:57 PM) 1.5 tablets = 450 mg  aluminum hydroxide-magnesium carbonate (GAVISCON) 95-358 MG/15ML SUSP [id number] Yes Take 15 mLs by mouth as needed for indigestion or heartburn. [provider] Taking Active Self  apixaban (ELIQUIS) 2.5 MG TABS tablet [id number] Yes Take 1 tablet (2.5 mg total) by mouth 2 (two) times daily. Minus Breeding, MD Taking Active   Ascorbic Acid (VITAMIN C PO) [id number] Yes Take 1 tablet by mouth daily with breakfast. [provider] Taking Active   atorvastatin (LIPITOR) 80 MG tablet [id number] Yes TAKE 1 TABLET BY MOUTH EVERYDAY AT BEDTIME  Patient taking differently: Take 80 mg by mouth at bedtime.   Minus Breeding, MD Taking Active   azelastine (ASTELIN) 0.1 % nasal spray [id number] Yes PLACE 2 SPRAYS INTO BOTH NOSTRILS AT BEDTIME AS NEEDED FOR RHINITIS OR ALLERGIES. Roma Schanz R, DO Taking Active   Coenzyme Q10 (CO Q-10 PO) [id number] Yes Take 1 capsule by mouth daily. [provider] Taking Active   esomeprazole (NEXIUM) 40 MG capsule [id number] Yes Take 1 capsule (40 mg total) by mouth daily.  Patient taking differently: Take 40 mg by mouth daily before breakfast.   [name], MD Taking Active Self  ezetimibe (ZETIA) 10 MG  tablet [id number] Yes TAKE 1 TABLET BY MOUTH EVERY DAY Minus Breeding, MD Taking Active   FARXIGA 10 MG TABS tablet [id number] Yes Take 10 mg by mouth in the morning. [provider] Taking Active   fenofibrate 160 MG tablet [id number] Yes TAKE 1 TABLET BY MOUTH EVERY DAY Carollee Herter, [name] Apa, DO Taking Active   folic acid (FOLVITE) 1 MG tablet [id number] Yes TAKE 2 TABLETS BY MOUTH EVERY DAY  Patient taking differently: Take 2 mg by mouth in the morning.   [name], MD Taking Active   FREESTYLE LITE test strip [id number] Yes USE TO TEST BLOOD SUGAR ONCE A DAY. DX CODE: E11.9 [name], DO Taking Active   glimepiride (AMARYL) 1 MG tablet [id number] Yes Take 1.5 tablets (1.5 mg total) by mouth daily with breakfast. Carollee Herter, [name] Apa, DO Taking Active   icosapent Ethyl (VASCEPA) 1 g capsule [id number] Yes TAKE 2 CAPSULES BY MOUTH TWICE A DAY  Patient taking differently: Take 2 g by mouth in the morning and at bedtime.   Carollee Herter, [name] Apa, DO Taking Active   Lancets (FREESTYLE) lancets [id number] Yes USE ONCE A DAY TO CHECK BLOOD SUGAR. [name], DO Taking Active   Menthol-Camphor (ICY HOT ADVANCED PAIN RELIEF EX) [id number] Yes Apply 1 application  topically daily as needed (to painful sites). [provider] Taking Active Self  metoprolol succinate (TOPROL-XL) 25 MG 24 hr tablet [id number] Yes Take 1 tablet (25 mg total) by mouth daily. Minus Breeding, MD Taking Active   nitroGLYCERIN (NITROSTAT) 0.4 MG SL tablet [id number] Yes PLACE 1 TABLET (0.4 MG TOTAL) UNDER THE TONGUE EVERY 5 (FIVE) MINUTES AS NEEDED FOR CHEST PAIN. Minus Breeding, MD Taking Active   potassium chloride SA (KLOR-CON M20) 20 MEQ tablet [id number] Yes Take 2 tablets (40 mEq total) by mouth daily. Modena Jansky, MD Taking Active   pregabalin (LYRICA) 150 MG capsule [id number] Yes TAKE 1 CAPSULE BY MOUTH  TWICE A DAY [name], DO Taking Active   psyllium (METAMUCIL)  58.6 % powder [id number] Yes Take 1 packet by mouth daily as needed (for constipation- mix and drink). [provider] Taking Active Self  senna (SENOKOT) 8.6 MG TABS tablet [id number] Yes Take 2 tablets (17.2 mg total) by mouth at bedtime. This is for constipation.  Stop taking if you start having diarrhea. Modena Jansky, MD Taking Active   torsemide (DEMADEX) 20 MG tablet [id number] Yes Take 2 tablets (40 mg total) by mouth in the morning. Do not take on 01/24/2022. Modena Jansky, MD Taking Active   Med List Note Henderson Newcomer, CPhT 06/10/13 4680): cpap machine 2 L of oxygen at night            Patient Active Problem List   Diagnosis Date Noted   Chronic combined systolic and diastolic heart failure ([place]) 02/06/2022   Acute exacerbation of CHF (congestive heart failure) ([place]) 01/16/2022   Hemoptysis 12/26/2021   Subclavian vein stenosis 12/23/2021   Strep throat 11/16/2021   Multiple superficial wounds with infection 01/04/2021   Cardiac pacemaker in situ 11/20/2020   S/P CABG x 4 11/20/2020   Ascending aortic aneurysm ([place]) 11/20/2020   Senile purpura ([place]) 07/27/2020   Porokeratosis 06/20/2020   Pain due to onychomycosis of toenails of both feet 12/14/2019   Educated about COVID-19 virus infection 11/25/2019   Uncontrolled type 2 diabetes mellitus with hypoglycemia without coma ([place]) 08/10/2019   Hyperlipidemia associated with type 2 diabetes mellitus ([place]) 08/10/2019   S/P left TKA 06/29/2019   Peripheral neuropathy 07/27/2018   Uncontrolled type 2 diabetes mellitus with hyperglycemia ([place]) 05/13/2018   Diabetic peripheral neuropathy associated with type 2 diabetes mellitus ([place]) 05/13/2018   Pansinusitis 05/13/2018   Severe aortic stenosis    Complete heart block (HCC)    Paroxysmal atrial fibrillation (HCC)    Acute on chronic diastolic CHF (congestive heart failure), NYHA class 2 (HCC)    Left arm swelling    Status post transcatheter aortic valve  replacement (TAVR) using bioprosthesis 09/09/2017   Demand ischemia of myocardium (HCC)    Typical atrial flutter ([place])    Acute on chronic diastolic heart failure ([place])    GIB (gastrointestinal bleeding) 08/16/2017   Hyperlipidemia 08/05/2017   NSTEMI (non-ST elevated myocardial infarction) ([place])    Aortic stenosis, severe 07/25/2017   Pancytopenia ([place]) 07/24/2017   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 ([place]) 02/27/2017   Bradycardia 01/04/2017   Coronary artery disease 01/04/2017   Stenosis of carotid artery 01/04/2017   Obesity 09/18/2016   Wenckebach block    Disorder associated with type II diabetes mellitus ([place]) 05/21/2013   Spinal stenosis of lumbar region 04/08/2013   Anemia, iron deficiency 11/29/2011   B12 deficiency anemia 07/30/2011   OSA (obstructive sleep apnea) 07/06/2010   CAD, ARTERY BYPASS GRAFT 08/22/2009   Essential hypertension 03/29/2009   Bilateral lower extremity edema 02/21/2009   Hyperlipidemia LDL goal <70 11/26/2008    Immunization History  Administered Date(s) Administered   Fluad Quad(high Dose 65+) 03/21/2021   Influenza Split 06/12/2011, 05/31/2013   Influenza Whole 05/01/2012   Influenza, High Dose Seasonal PF 02/09/2015, 03/12/2017, 03/09/2018, 03/02/2020   Influenza-Unspecified 02/11/2014, 02/29/2016, 02/25/2019   Moderna Sars-Covid-2 Vaccination 08/30/2019, 09/30/2019, 04/21/2020   Pneumococcal Conjugate-13 09/18/2015   Pneumococcal Polysaccharide-23 06/20/2013   Td 03/20/2010, 03/16/2013, 09/29/2017   Zoster Recombinat (Shingrix) 11/21/2016, 09/25/2017    Conditions to be addressed/monitored: Atrial Fibrillation, CHF,  CAD, HTN, HLD, Hypertriglyceridemia, DMII, CKD Stage 3, and gout; neuropathy; h/o GIB; OSA with CPAP;   Care Plan : General Pharmacy (Adult)  Updates made by [name], RPH-CPP since 02/26/2022 12:00 AM     Problem: HTN, HLD, DM, HF   Priority: High  Onset Date: 09/13/2020     Long-Range  Goal: Provide education, support and care coordination for medication therapy and chronic conditions   Start Date: 09/13/2020  Expected End Date: 03/16/2021  Recent Progress: On track  Priority: High  Note:   Current Barriers:  Unable to independently afford cost of medication (Farxiga, Eliquis)  Unable to maintain control of mixed hyperlipidemia (elevated LDL and triglycerides and low HDL) Changes in renal function  Pharmacist Clinical Goal(s):  Over the next 90 days, patient will achieve adherence to monitoring guidelines and medication adherence to achieve therapeutic efficacy maintain control of blood pressure and BG as evidenced by home monitoring.  contact provider office for questions/concerns as evidenced notation of same in electronic health record through collaboration with PharmD and provider.  Work to bring down TG's by dietary modifications, continued exercised, control of BG and taking medications as prescribed Review medications and make adjustments if needed based on renal function  Interventions: 1:1 collaboration with Carollee Herter, [name] Apa, DO regarding development and update of comprehensive plan of care as evidenced by provider attestation and co-signature Inter-disciplinary care [name] collaboration (see longitudinal plan of care) Comprehensive medication review performed; medication list updated in electronic medical record  Hypertension (BP goal <130/80) Controlled Current treatment: Metoprolol succinate ER 51m daily in evening Torsemide 237m- take 2 tablets = 4042maily  Medications previously tried: benazepril (swelling), HCTZ (anaphylaxis); amlodipine (worsened CHF)  Current home BP range SBP = 113 to 120 DBP = 63 to 71 Diet:  has been reducing intake of sweets and fried foods Also is chooseing leaner proteins and lower fat dairy. Has replaced sodas with sparkling water. Exercise: decreased recently due to shortness of breath but he has started walking a  little on treadmill. Denies hypotensive/hypertensive symptoms Patient c/o drowsiness in the morning especially on Sundays during church. He is wondering if it is metoprolol. At last visit recommended trial of taking metoprolol succinate at night to see if daytime drowsiness improves. Patient reports drowsiness improved (also changed dosing of Lyrica / pregabalin which also might have helped) Interventions:  Educated on blood pressure  goals and benefits of medications for prevention of heart attack, stroke and kidney damage; Recommended limit daily salt intake to < 2300 mg; Exercise goal of 150 minutes per week; Continue to check blood pressure  at home 2 to 3 times per week; document, and provide log at future appointments  Hyperlipidemia / CAD: (LDL goal < 70; Tg goal <150; HDL goal >40) Not at goal but triglycerides have improved from 839 to 164! LDL is close to goal Current treatment: Atorvastatin 46m49mily Ezetimibe 10mg26mly Fenofibrate 160mg 30my Vascepa 1g - take 2 capsules twice daily  Nitroglycerin 0.4mg as33meded Medications previously tried: none noted Interventions:  Continue dietary changes mentioned above under HTN Reviewed cholesterol goals;  Continue to exercise regularly with goal of at least 150 minutes per week Future treatment options if LDL and Tg remain above goals - change atorvastatin to rosuvastatin 40mg or41m PCSK9 agent  Diabetes (A1c goal <7% but will accept < 7.5%) Controlled; Last A1c was 6.6%  Current medications: Glimepiride 1.5mg dail73mFarxiga 10mg dail62mrrent home glucose readings range from 139  to 182  Denies hypoglycemic/hyperglycemic symptoms Screened for patient assistance program for Farxiga at past visit - patient did not meet 2023 patient assistance program income requirement. Current meal patterns: see above Current exercise: see above Recent decrease in renal function - metformin was discontinued during July 2023 hospitalization.   Interventions:  Educated on A1c and blood glucose goals Continue to exercise at least 150 minutes per week; Reminded to check feet daily and get yearly eye exam Recommended increase glimepiride to take 37m daily  Heart Failure (Goal: manage symptoms and prevent exacerbations) Improving after last hospitalization 12/2021 Followed by cardiology Dr. HPercival SpanishLast ejection fraction: 30-35%  HF type: Diastolic / HFrEF Current treatment: Torsemide 29mdaily - take 2 tablets = 4066maily  Potassium 70m38m take 2 tablets = 40 mEq twice daily Metoprolol succinate 25mg83make 1 tablet daily  Farxiga 10mg 29my Medications previously tried: furosemide (ineffective); ACE - angioedema;  Entresto - not started due to history of engioedema with ACE-inhibitor Interventions:  Educated on Benefits of medications for managing symptoms and prolonging life Recommended to continue current medication Discussed signs and symptoms of CHF exacerbation - weight gain, SOB, abdominal fullness, swelling in legs or abdomen, Fatigue and weakness, changes in ability to perform usual activities, persistent cough or wheezing with white or pink blood-tinged mucus, nausea and lack of appetite Continue to weigh daily - report weight gain of more than 3 lbs in 24 hours or 5 lbs in 1 week.  CKD - stage 3 (goal: slow pregression of  renal disease and adjust medications as needed based on renal function) Managed by nephrology - sees Dr Singh Candiss Norse23 for follow up Current treatment: Farxiga 10mg d68m Last eGFR was 35 Estimated CrCL = 25 - 35mL/mi60mange based on using adjusted and ideal body weight)  Lab Results  Component Value Date   CREATININE 1.86 (H) 02/08/2022   Wt Readings from Last 3 Encounters:  02/08/22 232 lb 3.2 oz (105.3 kg)  01/28/22 230 lb (104.3 kg)  01/25/22 233 lb 3.2 oz (105.8 kg)   Interventions:  Reviewed medications that might need adjustment based on renal function:  Eliquis - adjusted  already based on renal function and age to 2.5mg twic34m day Pregabalin - CrCL 30 to 60 mL/min = max of 300mg/ day29mtient is taking 300mg / day90mnofibrate - recommended starting dose is 54mg daily 80madjust upward as needed monitoring for myalgia. Patient is on highest dose but Ok as long as monitoring for myalgias due to increased risk with lower renal function and concurrent treatment with statin.    Medication management Pharmacist Clinical Goal(s): Over the next 90 days, patient will work with PharmD and providers to maintain optimal medication adherence Current pharmacy: [facility] [place] Inter[place] Comprehensive medication review performed. Reviewed refill history and assessed adherence Continue current medication management strategy Patient self care activities - Over the next 90 days, patient will: Focus on medication adherence by filling medications appropriately  Take medications as prescribed Report any questions or concerns to PharmD and/or provider(s)  Patient Goals/Self-Care Activities Over the next 90 days, patient will:  take medications as prescribed check glucose daily, document, and provide at future appointments check blood pressure 2 to 3 times daily, document, and provide at future appointments target a minimum of 150 minutes of moderate intensity exercise weekly (once shortness of breath improves) Increase glimepiride to 2mg each mor9mg.   Follow Up Plan: The care management [name] will reach out to the patient  again over the next 30 days.         Medication Assistance:  assessed for Farxiga and Eliquis PAP -patient does not meet income requirement  Patient's preferred pharmacy is:  [facility] [address] [place] [address] Phone: [phone] Fax: [phone]   Follow Up:  Patient agrees to Care Plan and Follow-up.  Plan: Telephone follow up appointment with care management [name] member  scheduled for:  4 weeks  [name], PharmD Clinical Pharmacist [place] [phone]

## 2022-02-27 ENCOUNTER — Other Ambulatory Visit: Payer: Medicare Other

## 2022-02-27 ENCOUNTER — Ambulatory Visit: Payer: Medicare Other | Admitting: Family

## 2022-02-27 ENCOUNTER — Ambulatory Visit: Payer: Medicare Other

## 2022-02-28 DIAGNOSIS — E785 Hyperlipidemia, unspecified: Secondary | ICD-10-CM | POA: Diagnosis not present

## 2022-02-28 DIAGNOSIS — Z7984 Long term (current) use of oral hypoglycemic drugs: Secondary | ICD-10-CM

## 2022-02-28 DIAGNOSIS — N183 Chronic kidney disease, stage 3 unspecified: Secondary | ICD-10-CM | POA: Diagnosis not present

## 2022-02-28 DIAGNOSIS — I2510 Atherosclerotic heart disease of native coronary artery without angina pectoris: Secondary | ICD-10-CM

## 2022-02-28 DIAGNOSIS — I5030 Unspecified diastolic (congestive) heart failure: Secondary | ICD-10-CM | POA: Diagnosis not present

## 2022-02-28 DIAGNOSIS — I130 Hypertensive heart and chronic kidney disease with heart failure and stage 1 through stage 4 chronic kidney disease, or unspecified chronic kidney disease: Secondary | ICD-10-CM

## 2022-02-28 DIAGNOSIS — E1159 Type 2 diabetes mellitus with other circulatory complications: Secondary | ICD-10-CM

## 2022-02-28 DIAGNOSIS — E1122 Type 2 diabetes mellitus with diabetic chronic kidney disease: Secondary | ICD-10-CM

## 2022-03-05 ENCOUNTER — Telehealth: Payer: Self-pay | Admitting: Cardiology

## 2022-03-05 MED ORDER — TORSEMIDE 20 MG PO TABS: 40.0000 mg | ORAL_TABLET | Freq: Every morning | ORAL | 3 refills | Status: DC

## 2022-03-05 NOTE — Telephone Encounter (Signed)
Called patient left message on personal voice mail Torsemide refill sent to pharmacy.

## 2022-03-05 NOTE — Telephone Encounter (Signed)
*  STAT* If patient is at the pharmacy, call can be transferred to refill team.   1. Which medications need to be refilled? (please list name of each medication and dose if known) torsemide (DEMADEX) 20 MG tablet  2. Which pharmacy/location (including street and city if local pharmacy) is medication to be sent to? [facility] [place] [place] [facility] [place]  3. Do they need a 30 day or 90 day supply? 69   Pt states that the last person to fill was doctor in hospital and it was increased. He states he needs this called in again by his cardiologist.  Pt states he is out of this medication.

## 2022-03-12 ENCOUNTER — Ambulatory Visit (INDEPENDENT_AMBULATORY_CARE_PROVIDER_SITE_OTHER): Payer: Medicare Other | Admitting: Podiatry

## 2022-03-12 ENCOUNTER — Encounter: Payer: Self-pay | Admitting: Podiatry

## 2022-03-12 DIAGNOSIS — E1142 Type 2 diabetes mellitus with diabetic polyneuropathy: Secondary | ICD-10-CM

## 2022-03-12 DIAGNOSIS — Q828 Other specified congenital malformations of skin: Secondary | ICD-10-CM

## 2022-03-12 NOTE — Progress Notes (Signed)
This patient returns to my office for at risk foot care.  This patient requires this care by a professional since this patient will be at risk due to having diabetic neuropathy  and coagulation defect.  Patient is taking eliquis. This patient is unable to cut nails himself since the patient cannot reach his nails.These nails are painful walking and wearing shoes.  Patient says the callus on his right forefoot has become painful when walking.  This patient presents for at risk foot care today.  General Appearance  Alert, conversant and in no acute stress.  Vascular  Dorsalis pedis and posterior tibial  pulses are palpable  bilaterally.  Capillary return is within normal limits  bilaterally. Temperature is within normal limits  bilaterally.  Neurologic  Senn-Weinstein monofilament wire test within normal limits  Left foot.  LOPS right foot is absent.  . Muscle power within normal limits bilaterally.  Nails Thick disfigured discolored nails with subungual debris  from hallux to fifth toes bilaterally. No evidence of bacterial infection or drainage bilaterally.  Orthopedic  No limitations of motion  feet .  No crepitus or effusions noted.  No bony pathology or digital deformities noted.  Plantar flexed fifth metatarsal right foot.  Skin  normotropic skin with noted bilaterally.  No signs of infections or ulcers noted  Porokeratosis sub 5th right foot.  Onychomycosis  Pain in right toes  Pain in left toes  Porokeratosis sub 5th right  Consent was obtained for treatment procedures.   Mechanical debridement of nails 1-5  bilaterally performed with a nail nipper.  Filed with dremel without incident.  Padding added to hs shoe per patient request.   Return office visit   12 weeks.                  Told patient to return for periodic foot care and evaluation due to potential at risk complications.   Gardiner Barefoot DPM

## 2022-03-18 ENCOUNTER — Inpatient Hospital Stay: Payer: Medicare Other

## 2022-03-18 ENCOUNTER — Other Ambulatory Visit: Payer: Self-pay

## 2022-03-18 ENCOUNTER — Inpatient Hospital Stay: Payer: Medicare Other | Attending: Hematology & Oncology

## 2022-03-18 ENCOUNTER — Inpatient Hospital Stay (HOSPITAL_BASED_OUTPATIENT_CLINIC_OR_DEPARTMENT_OTHER): Payer: Medicare Other | Admitting: Family

## 2022-03-18 ENCOUNTER — Encounter: Payer: Self-pay | Admitting: Family

## 2022-03-18 VITALS — BP 106/58 | HR 64 | Temp 98.1°F | Resp 19 | Ht 72.0 in | Wt 231.4 lb

## 2022-03-18 DIAGNOSIS — C8332 Diffuse large B-cell lymphoma, intrathoracic lymph nodes: Secondary | ICD-10-CM

## 2022-03-18 DIAGNOSIS — D508 Other iron deficiency anemias: Secondary | ICD-10-CM

## 2022-03-18 DIAGNOSIS — D500 Iron deficiency anemia secondary to blood loss (chronic): Secondary | ICD-10-CM

## 2022-03-18 DIAGNOSIS — D510 Vitamin B12 deficiency anemia due to intrinsic factor deficiency: Secondary | ICD-10-CM | POA: Diagnosis not present

## 2022-03-18 DIAGNOSIS — D519 Vitamin B12 deficiency anemia, unspecified: Secondary | ICD-10-CM

## 2022-03-18 LAB — CMP (CANCER CENTER ONLY)
ALT: 29 U/L (ref 0–44)
AST: 22 U/L (ref 15–41)
Albumin: 4.5 g/dL (ref 3.5–5.0)
Alkaline Phosphatase: 69 U/L (ref 38–126)
Anion gap: 11 (ref 5–15)
BUN: 47 mg/dL — ABNORMAL HIGH (ref 8–23)
CO2: 28 mmol/L (ref 22–32)
Calcium: 10.4 mg/dL — ABNORMAL HIGH (ref 8.9–10.3)
Chloride: 99 mmol/L (ref 98–111)
Creatinine: 1.87 mg/dL — ABNORMAL HIGH (ref 0.61–1.24)
GFR, Estimated: 35 mL/min — ABNORMAL LOW (ref >60)
Glucose, Bld: 348 mg/dL — ABNORMAL HIGH (ref 70–99)
Potassium: 4 mmol/L (ref 3.5–5.1)
Sodium: 138 mmol/L (ref 135–145)
Total Bilirubin: 0.6 mg/dL (ref 0.3–1.2)
Total Protein: 7.6 g/dL (ref 6.5–8.1)

## 2022-03-18 LAB — CBC WITH DIFFERENTIAL (CANCER CENTER ONLY)
Abs Immature Granulocytes: 0.16 10*3/uL — ABNORMAL HIGH (ref 0.00–0.07)
Basophils Absolute: 0 10*3/uL (ref 0.0–0.1)
Basophils Relative: 0 %
Eosinophils Absolute: 0.2 10*3/uL (ref 0.0–0.5)
Eosinophils Relative: 2 %
HCT: 45.2 % (ref 39.0–52.0)
Hemoglobin: 14.6 g/dL (ref 13.0–17.0)
Immature Granulocytes: 2 %
Lymphocytes Relative: 19 %
Lymphs Abs: 1.8 10*3/uL (ref 0.7–4.0)
MCH: 29 pg (ref 26.0–34.0)
MCHC: 32.3 g/dL (ref 30.0–36.0)
MCV: 89.9 fL (ref 80.0–100.0)
Monocytes Absolute: 0.6 10*3/uL (ref 0.1–1.0)
Monocytes Relative: 6 %
Neutro Abs: 6.7 10*3/uL (ref 1.7–7.7)
Neutrophils Relative %: 71 %
Platelet Count: 199 10*3/uL (ref 150–400)
RBC: 5.03 MIL/uL (ref 4.22–5.81)
RDW: 17.2 % — ABNORMAL HIGH (ref 11.5–15.5)
WBC Count: 9.4 10*3/uL (ref 4.0–10.5)
nRBC: 0 % (ref 0.0–0.2)

## 2022-03-18 LAB — RETICULOCYTES
Immature Retic Fract: 23.5 % — ABNORMAL HIGH (ref 2.3–15.9)
RBC.: 4.99 MIL/uL (ref 4.22–5.81)
Retic Count, Absolute: 79.3 10*3/uL (ref 19.0–186.0)
Retic Ct Pct: 1.6 % (ref 0.4–3.1)

## 2022-03-18 LAB — LACTATE DEHYDROGENASE: LDH: 155 U/L (ref 98–192)

## 2022-03-18 LAB — VITAMIN B12: Vitamin B-12: 303 pg/mL (ref 180–914)

## 2022-03-18 MED ORDER — CYANOCOBALAMIN 1000 MCG/ML IJ SOLN
1000.0000 ug | Freq: Once | INTRAMUSCULAR | Status: AC
  Administered 2022-03-18: 1000 ug via INTRAMUSCULAR
  Filled 2022-03-18: qty 1

## 2022-03-18 NOTE — Progress Notes (Signed)
Hematology and Oncology Follow Up Visit  Richard [name] [id number] 1941/02/02 81 y.o. 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 He is doing well after losing a good deal of fluid weight. He states that he is feeling so much better and his golf game has also improved.  B 12 last month was up to 346.  No blood loss noted. No bruising, no petechiae.  No fever, chills, n/v, cough, rash, dizziness, SOB, chest pain, palpitations, abdominal pain or changes in bowel or bladder habits.  Minimal swelling in his lower extremities, no swelling in the upper extremities at this time.  Neuropathy in his feet unchanged form baseline.  He states that he was taken off Metformin due to increased kidney function tests. He follows up with Dr. Candiss Norse soon.  Blood glucose today is 348.  No falls or syncope reported.  Appetite and hydration have been good. Weight stable at 231 lbs.   ECOG Performance Status: 1 - Symptomatic but completely ambulatory  Medications:  Allergies as of 03/18/2022       Reactions   Ace Inhibitors Swelling, Other (See Comments)   Angioedema   Benazepril Swelling, Other (See Comments)   Angioedema; he is not a candidate for any angiotensin receptor blockers because of this significant allergic reaction. Because of a history of documented adverse serious drug reaction;Medi Alert bracelet  is recommended   Entresto [sacubitril-valsartan] Swelling, Other (See Comments)   First-in-Class Angiotensin Receptor Neprilysin Inhibitor- Med was "red-flagged" by the patient's pharmacy for him to NOT  take!   Hctz [hydrochlorothiazide] Anaphylaxis, Swelling   Tongue and lip swelling   Aspirin Other (See Comments)   Gastritis, can aspirin not take 325 mg aspirin        Medication List        Accurate as of March 18, 2022  1:45 PM. If you have any questions, ask your nurse or doctor.          acetaminophen 325 MG tablet Commonly known as: TYLENOL Take 650 mg by mouth at bedtime as needed for moderate pain or headache.   allopurinol 300 MG tablet Commonly known as: ZYLOPRIM Take 450 mg by mouth daily.   aluminum hydroxide-magnesium carbonate 95-358 MG/15ML Susp Commonly known as: GAVISCON Take 15 mLs by mouth as needed for indigestion or heartburn.   apixaban 2.5 MG Tabs tablet Commonly known as: ELIQUIS Take 1 tablet (2.5 mg total) by mouth 2 (two) times daily.   atorvastatin 80 MG tablet Commonly known as: LIPITOR TAKE 1 TABLET BY MOUTH EVERYDAY AT BEDTIME What changed: See the new instructions.   azelastine 0.1 % nasal spray Commonly known as: ASTELIN PLACE 2 SPRAYS INTO BOTH NOSTRILS AT BEDTIME AS NEEDED FOR RHINITIS OR ALLERGIES.   CO Q-10 PO Take 1 capsule by mouth daily.   esomeprazole 40 MG capsule Commonly known as: NexIUM Take 1 capsule (40 mg total) by mouth daily. What changed: when to take this   ezetimibe 10 MG tablet Commonly known as: ZETIA TAKE 1 TABLET BY  MOUTH EVERY DAY   Farxiga 10 MG Tabs tablet Generic drug: dapagliflozin propanediol Take 10 mg by mouth in the morning.   fenofibrate 160 MG tablet TAKE 1 TABLET BY MOUTH EVERY DAY   folic acid 1 MG tablet Commonly known as: FOLVITE TAKE 2 TABLETS BY MOUTH EVERY DAY What changed: when to take this   freestyle lancets USE ONCE A DAY TO CHECK BLOOD SUGAR.   FREESTYLE LITE test strip Generic drug: glucose blood USE TO TEST BLOOD SUGAR ONCE A DAY. DX CODE: E11.9   glimepiride 2 MG tablet Commonly known as: AMARYL Take 1 tablet (2 mg total) by mouth daily with breakfast.    icosapent Ethyl 1 g capsule Commonly known as: VASCEPA TAKE 2 CAPSULES BY MOUTH TWICE A DAY What changed: when to take this   ICY HOT ADVANCED PAIN RELIEF EX Apply 1 application  topically daily as needed (to painful sites).   metoprolol succinate 25 MG 24 hr tablet Commonly known as: TOPROL-XL Take 1 tablet (25 mg total) by mouth daily.   nitroGLYCERIN 0.4 MG SL tablet Commonly known as: NITROSTAT PLACE 1 TABLET (0.4 MG TOTAL) UNDER THE TONGUE EVERY 5 (FIVE) MINUTES AS NEEDED FOR CHEST PAIN.   potassium chloride SA 20 MEQ tablet Commonly known as: Klor-Con M20 Take 2 tablets (40 mEq total) by mouth daily.   pregabalin 150 MG capsule Commonly known as: LYRICA TAKE 1 CAPSULE BY MOUTH TWICE A DAY   psyllium 58.6 % powder Commonly known as: METAMUCIL Take 1 packet by mouth daily as needed (for constipation- mix and drink).   senna 8.6 MG Tabs tablet Commonly known as: SENOKOT Take 2 tablets (17.2 mg total) by mouth at bedtime. This is for constipation.  Stop taking if you start having diarrhea.   torsemide 20 MG tablet Commonly known as: DEMADEX Take 2 tablets (40 mg total) by mouth in the morning. Do not take on 01/24/2022.   VITAMIN C PO Take 1 tablet by mouth daily with breakfast.        Allergies:  Allergies  Allergen Reactions   Ace Inhibitors Swelling and Other (See Comments)    Angioedema    Benazepril Swelling and Other (See Comments)    Angioedema; he is not a candidate for any angiotensin receptor blockers because of this significant allergic reaction. Because of a history of documented adverse serious drug reaction;Medi Alert bracelet  is recommended   Entresto [Sacubitril-Valsartan] Swelling and Other (See Comments)    First-in-Class Angiotensin Receptor Neprilysin Inhibitor- Med was "red-flagged" by the patient's pharmacy for him to NOT take!   Hctz [Hydrochlorothiazide] Anaphylaxis and Swelling    Tongue and lip swelling    Aspirin Other (See  Comments)    Gastritis, can aspirin not take 325 mg aspirin    Past Medical History, Surgical history, Social history, and Family History were reviewed and updated.  Review of Systems: All other 10 point review of systems is negative.   Physical Exam:  height is 6' (1.829 m) and weight is 231 lb 6.4 oz (105 kg). His oral temperature is 98.1 F (36.7 C). His blood pressure is 106/58 (abnormal) and his pulse is 64. His respiration is 19 and oxygen saturation is 98%.   Wt Readings from Last 3 Encounters:  03/18/22 231 lb 6.4 oz (105 kg)  02/08/22 232 lb 3.2 oz (105.3 kg)  01/28/22 230 lb (104.3 kg)    Ocular: Sclerae unicteric, pupils equal, round and reactive to light Ear-nose-throat: Oropharynx clear, dentition  fair Lymphatic: No cervical or supraclavicular adenopathy Lungs no rales or rhonchi, good excursion bilaterally Heart regular rate and rhythm, no murmur appreciated Abd soft, nontender, positive bowel sounds MSK no focal spinal tenderness, no joint edema Neuro: non-focal, well-oriented, appropriate affect Breasts: Deferred   Lab Results  Component Value Date   WBC 9.4 03/18/2022   HGB 14.6 03/18/2022   HCT 45.2 03/18/2022   MCV 89.9 03/18/2022   PLT 199 03/18/2022   Lab Results  Component Value Date   FERRITIN 1,987 (H) 01/28/2022   IRON 80 01/28/2022   TIBC 377 01/28/2022   UIBC 297 01/28/2022   IRONPCTSAT 21 01/28/2022   Lab Results  Component Value Date   RETICCTPCT 1.6 03/18/2022   RBC 4.99 03/18/2022   RETICCTABS 52.1 11/22/2011   No results found for: "KPAFRELGTCHN", "LAMBDASER", "[facility]" Lab Results  Component Value Date   IGA 159 03/09/2012   Lab Results  Component Value Date   ALBUMINELP 4.2 07/27/2018   MSPIKE Not Observed 07/27/2018     Chemistry      Component Value Date/Time   NA 138 02/08/2022 0811   NA 144 06/27/2017 0857   K 4.8 02/08/2022 0811   K 3.9 06/27/2017 0857   CL 101 02/08/2022 0811   CL 103 06/27/2017 0857    CO2 22 02/08/2022 0811   CO2 26 06/27/2017 0857   BUN 46 (H) 02/08/2022 0811   BUN 12 06/27/2017 0857   CREATININE 1.86 (H) 02/08/2022 0811   CREATININE 1.93 (H) 01/28/2022 1355   CREATININE 1.65 (H) 04/18/2020 0956      Component Value Date/Time   CALCIUM 9.6 02/08/2022 0811   CALCIUM 9.2 06/27/2017 0857   ALKPHOS 65 01/28/2022 1355   ALKPHOS 128 (H) 06/27/2017 0857   AST 27 01/28/2022 1355   ALT 26 01/28/2022 1355   ALT 24 06/27/2017 0857   BILITOT 0.6 01/28/2022 1355       Impression and Plan: Richard [name] is a very pleasant 81 yo caucasian gentleman with diffuse large B-cell lymphoma (not "double hit" lymphoma). He completed treatment in February 2019.  B 12 given today as planned.  Iron studies pending.  Follow-up in 1 month.   Lottie Dawson, NP 9/18/20231:45 PM

## 2022-03-18 NOTE — Patient Instructions (Signed)
Vitamin B12 Injection What is this medication? Vitamin B12 (VAHY tuh min B12) prevents and treats low vitamin B12 levels in your body. It is used in people who do not get enough vitamin B12 from their diet or when their digestive tract does not absorb enough. Vitamin B12 plays an important role in maintaining the health of your nervous system and red blood cells. This medicine may be used for other purposes; ask your health care provider or pharmacist if you have questions. COMMON BRAND NAME(S): B-12 Compliance Kit, B-12 Injection Kit, Cyomin, Dodex, LA-12, Nutri-Twelve, Physicians EZ Use B-12, Primabalt What should I tell my care team before I take this medication? They need to know if you have any of these conditions: Kidney disease Leber's disease Megaloblastic anemia An unusual or allergic reaction to cyanocobalamin, cobalt, other medications, foods, dyes, or preservatives Pregnant or trying to get pregnant Breast-feeding How should I use this medication? This medication is injected into a muscle or deeply under the skin. It is usually given in a clinic or care team's office. However, your care team may teach you how to inject yourself. Follow all instructions. Talk to your care team about the use of this medication in children. Special care may be needed. Overdosage: If you think you have taken too much of this medicine contact a poison control center or emergency room at once. NOTE: This medicine is only for you. Do not share this medicine with others. What if I miss a dose? If you are given your dose at a clinic or care team's office, call to reschedule your appointment. If you give your own injections, and you miss a dose, take it as soon as you can. If it is almost time for your next dose, take only that dose. Do not take double or extra doses. What may interact with this medication? Alcohol Colchicine This list may not describe all possible interactions. Give your health care  provider a list of all the medicines, herbs, non-prescription drugs, or dietary supplements you use. Also tell them if you smoke, drink alcohol, or use illegal drugs. Some items may interact with your medicine. What should I watch for while using this medication? Visit your care team regularly. You may need blood work done while you are taking this medication. You may need to follow a special diet. Talk to your care team. Limit your alcohol intake and avoid smoking to get the best benefit. What side effects may I notice from receiving this medication? Side effects that you should report to your care team as soon as possible: Allergic reactions--skin rash, itching, hives, swelling of the face, lips, tongue, or throat Swelling of the ankles, hands, or feet Trouble breathing Side effects that usually do not require medical attention (report to your care team if they continue or are bothersome): Diarrhea This list may not describe all possible side effects. Call your doctor for medical advice about side effects. You may report side effects to FDA at 1-800-FDA-1088. Where should I keep my medication? Keep out of the reach of children. Store at room temperature between 15 and 30 degrees C (59 and 85 degrees F). Protect from light. Throw away any unused medication after the expiration date. NOTE: This sheet is a summary. It may not cover all possible information. If you have questions about this medicine, talk to your doctor, pharmacist, or health care provider.  2023 Elsevier/Gold Standard (2020-08-24 00:00:00)

## 2022-03-19 LAB — IRON AND IRON BINDING CAPACITY (CC-WL,HP ONLY)
Iron: 99 ug/dL (ref 45–182)
Saturation Ratios: 25 % (ref 17.9–39.5)
TIBC: 392 ug/dL (ref 250–450)
UIBC: 293 ug/dL (ref 117–376)

## 2022-03-19 LAB — FERRITIN: Ferritin: 1715 ng/mL — ABNORMAL HIGH (ref 24–336)

## 2022-03-20 ENCOUNTER — Telehealth: Payer: Self-pay | Admitting: Cardiology

## 2022-03-20 DIAGNOSIS — I483 Typical atrial flutter: Secondary | ICD-10-CM

## 2022-03-20 MED ORDER — APIXABAN 2.5 MG PO TABS: 2.5000 mg | ORAL_TABLET | Freq: Two times a day (BID) | ORAL | 2 refills | Status: DC

## 2022-03-20 NOTE — Telephone Encounter (Signed)
*  STAT* If patient is at the pharmacy, call can be transferred to refill team.   1. Which medications need to be refilled? (please list name of each medication and dose if known) apixaban (ELIQUIS) 2.5 MG TABS tablet  2. Which pharmacy/location (including street and city if local pharmacy) is medication to be sent to? [facility] [place] [facility] [place]  3. Do they need a 30 day or 90 day supply? 90 day  Patient only has 1 day left.

## 2022-03-20 NOTE — Telephone Encounter (Signed)
Eliquis 2.'5mg'$  refill request received. Patient is 81 years old, weight-105kg, Crea-1.87 on 03/18/2022, Diagnosis-Aflutter, and last seen by Dr. Percival Spanish on 02/08/2022. Dose is appropriate based on dosing criteria. Will send in refill to requested pharmacy.

## 2022-03-25 DIAGNOSIS — D631 Anemia in chronic kidney disease: Secondary | ICD-10-CM | POA: Diagnosis not present

## 2022-03-25 DIAGNOSIS — I129 Hypertensive chronic kidney disease with stage 1 through stage 4 chronic kidney disease, or unspecified chronic kidney disease: Secondary | ICD-10-CM | POA: Diagnosis not present

## 2022-03-25 DIAGNOSIS — N2581 Secondary hyperparathyroidism of renal origin: Secondary | ICD-10-CM | POA: Diagnosis not present

## 2022-03-25 DIAGNOSIS — R609 Edema, unspecified: Secondary | ICD-10-CM | POA: Diagnosis not present

## 2022-03-25 DIAGNOSIS — N1832 Chronic kidney disease, stage 3b: Secondary | ICD-10-CM | POA: Diagnosis not present

## 2022-03-27 ENCOUNTER — Telehealth: Payer: Self-pay | Admitting: *Deleted

## 2022-03-27 NOTE — Telephone Encounter (Signed)
Per 03/18/22 los - called and lvm of upcoming appointments - requested callback to confirm

## 2022-04-02 ENCOUNTER — Ambulatory Visit (INDEPENDENT_AMBULATORY_CARE_PROVIDER_SITE_OTHER): Payer: Medicare Other | Admitting: Dermatology

## 2022-04-02 DIAGNOSIS — Z85828 Personal history of other malignant neoplasm of skin: Secondary | ICD-10-CM

## 2022-04-02 DIAGNOSIS — I25810 Atherosclerosis of coronary artery bypass graft(s) without angina pectoris: Secondary | ICD-10-CM

## 2022-04-02 DIAGNOSIS — L821 Other seborrheic keratosis: Secondary | ICD-10-CM

## 2022-04-02 DIAGNOSIS — L820 Inflamed seborrheic keratosis: Secondary | ICD-10-CM | POA: Diagnosis not present

## 2022-04-02 DIAGNOSIS — L578 Other skin changes due to chronic exposure to nonionizing radiation: Secondary | ICD-10-CM

## 2022-04-02 DIAGNOSIS — L570 Actinic keratosis: Secondary | ICD-10-CM | POA: Diagnosis not present

## 2022-04-02 DIAGNOSIS — Z23 Encounter for immunization: Secondary | ICD-10-CM | POA: Diagnosis not present

## 2022-04-02 NOTE — Patient Instructions (Addendum)
Cryotherapy Aftercare  Wash gently with soap and water everyday.   Apply Vaseline and Band-Aid daily until healed.     Due to recent changes in healthcare laws, you may see results of your pathology and/or laboratory studies on MyChart before the doctors have had a chance to review them. We understand that in some cases there may be results that are confusing or concerning to you. Please understand that not all results are received at the same time and often the doctors may need to interpret multiple results in order to provide you with the best plan of care or course of treatment. Therefore, we ask that you please give us 2 business days to thoroughly review all your results before contacting the office for clarification. Should we see a critical lab result, you will be contacted sooner.   If You Need Anything After Your Visit  If you have any questions or concerns for your doctor, please call our main line at [phone] and press option 4 to reach your doctor's medical assistant. If no one answers, please leave a voicemail as directed and we will return your call as soon as possible. Messages left after 4 pm will be answered the following business day.   You may also send us a message via MyChart. We typically respond to MyChart messages within 1-2 business days.  For prescription refills, please ask your pharmacy to contact our office. Our fax number is [phone].  If you have an urgent issue when the clinic is closed that cannot wait until the next business day, you can page your doctor at the number below.    Please note that while we do our best to be available for urgent issues outside of office hours, we are not available 24/7.   If you have an urgent issue and are unable to reach us, you may choose to seek medical care at your doctor's office, retail clinic, urgent care center, or emergency room.  If you have a medical emergency, please immediately call 911 or go to the  emergency department.  Pager Numbers  - Dr. Kowalski: [phone]  - Dr. Moye: [phone]  - Dr. Stewart: [phone]  In the event of inclement weather, please call our main line at [phone] for an update on the status of any delays or closures.  Dermatology Medication Tips: Please keep the boxes that topical medications come in in order to help keep track of the instructions about where and how to use these. Pharmacies typically print the medication instructions only on the boxes and not directly on the medication tubes.   If your medication is too expensive, please contact our office at [phone] option 4 or send us a message through MyChart.   We are unable to tell what your co-pay for medications will be in advance as this is different depending on your insurance coverage. However, we may be able to find a substitute medication at lower cost or fill out paperwork to get insurance to cover a needed medication.   If a prior authorization is required to get your medication covered by your insurance company, please allow us 1-2 business days to complete this process.  Drug prices often vary depending on where the prescription is filled and some pharmacies may offer cheaper prices.  The website www.goodrx.com contains coupons for medications through different pharmacies. The prices here do not account for what the cost may be with help from insurance (it may be cheaper with your insurance), but the website can   give you the price if you did not use any insurance.  - You can print the associated coupon and take it with your prescription to the pharmacy.  - You may also stop by our office during regular business hours and pick up a GoodRx coupon card.  - If you need your prescription sent electronically to a different pharmacy, notify our office through [facility] MyChart or by phone at [phone] option 4.     Si Usted Necesita Algo Despus de Su Visita  Tambin puede  enviarnos un mensaje a travs de MyChart. Por lo general respondemos a los mensajes de MyChart en el transcurso de 1 a 2 das hbiles.  Para renovar recetas, por favor pida a su farmacia que se ponga en contacto con nuestra oficina. Nuestro nmero de fax es el [phone].  Si tiene un asunto urgente cuando la clnica est cerrada y que no puede esperar hasta el siguiente da hbil, puede llamar/localizar a su doctor(a) al nmero que aparece a continuacin.   Por favor, tenga en cuenta que aunque hacemos todo lo posible para estar disponibles para asuntos urgentes fuera del horario de oficina, no estamos disponibles las 24 horas del da, los 7 das de la semana.   Si tiene un problema urgente y no puede comunicarse con nosotros, puede optar por buscar atencin mdica  en el consultorio de su doctor(a), en una clnica privada, en un centro de atencin urgente o en una sala de emergencias.  Si tiene una emergencia mdica, por favor llame inmediatamente al 911 o vaya a la sala de emergencias.  Nmeros de bper  - Dr. Kowalski: [phone]  - Dra. Moye: [phone]  - Dra. Stewart: [phone]  En caso de inclemencias del tiempo, por favor llame a nuestra lnea principal al [phone] para una actualizacin sobre el estado de cualquier retraso o cierre.  Consejos para la medicacin en dermatologa: Por favor, guarde las cajas en las que vienen los medicamentos de uso tpico para ayudarle a seguir las instrucciones sobre dnde y cmo usarlos. Las farmacias generalmente imprimen las instrucciones del medicamento slo en las cajas y no directamente en los tubos del medicamento.   Si su medicamento es muy caro, por favor, pngase en contacto con nuestra oficina llamando al [phone] y presione la opcin 4 o envenos un mensaje a travs de MyChart.   No podemos decirle cul ser su copago por los medicamentos por adelantado ya que esto es diferente dependiendo de la cobertura de su seguro.  Sin embargo, es posible que podamos encontrar un medicamento sustituto a menor costo o llenar un formulario para que el seguro cubra el medicamento que se considera necesario.   Si se requiere una autorizacin previa para que su compaa de seguros cubra su medicamento, por favor permtanos de 1 a 2 das hbiles para completar este proceso.  Los precios de los medicamentos varan con frecuencia dependiendo del lugar de dnde se surte la receta y alguna farmacias pueden ofrecer precios ms baratos.  El sitio web www.goodrx.com tiene cupones para medicamentos de diferentes farmacias. Los precios aqu no tienen en cuenta lo que podra costar con la ayuda del seguro (puede ser ms barato con su seguro), pero el sitio web puede darle el precio si no utiliz ningn seguro.  - Puede imprimir el cupn correspondiente y llevarlo con su receta a la farmacia.  - Tambin puede pasar por nuestra oficina durante el horario de atencin regular y recoger una tarjeta de cupones de GoodRx.  -   Si necesita que su receta se enve electrnicamente a una farmacia diferente, informe a nuestra oficina a travs de MyChart de [place] o por telfono llamando al [phone] y presione la opcin 4.  

## 2022-04-02 NOTE — Progress Notes (Signed)
Follow-Up Visit   Subjective  Richard [name] is a 81 y.o. male who presents for the following: Follow-up.  Patient presents for 3 month follow-up Aks and follow-up bxs x 4. Biopsy of the right lateral cheek, right postauricular neck, right forearm superior, and right wrist were SCC. All areas treated with EDC at time of biopsy. He has a couple of spots he would like checked on the left ear and left cheek, irritated by shaving.  Since we last saw him he was hospitalized for a week for congestive heart failure.  He said he lost 36 pounds of fluid.  He has a lot more energy now than he used to.  The following portions of the chart were reviewed this encounter and updated as appropriate:       Review of Systems:  No other skin or systemic complaints except as noted in HPI or Assessment and Plan.  Objective  Well appearing patient in no apparent distress; mood and affect are within normal limits.  A focused examination was performed including face, scalp. Relevant physical exam findings are noted in the Assessment and Plan.  Left Mid Cheek x 1 Erythematous stuck-on, waxy papule or plaque  R cheek x 5, R temple x 3, R ear helix x 1, R ear antihelix x 1, R nasal tip x 1, L cheek x 6, L jaw x 3, L postauricular neck x 3, L forehead x 2, R glabella x 1 (26) Pink scaly macules  Left Forearm x 13, L hand x 5, Right Forearm x 6, R hand x 2, L ear helix x 1 (27) Keratotic papules.  R lat cheek, R postauricular neck, R forearm sup, R wrist Well healed scar with no evidence of recurrence.     Assessment & Plan  Actinic Damage - Severe, confluent actinic changes with pre-cancerous actinic keratoses  - Severe, chronic, not at goal, secondary to cumulative UV radiation exposure over time - diffuse scaly erythematous macules and papules with underlying dyspigmentation - Discussed Prescription "Field Treatment" for Severe, Chronic Confluent Actinic Changes with Pre-Cancerous Actinic  Keratoses Field treatment involves treatment of an entire area of skin that has confluent Actinic Changes (Sun/ Ultraviolet light damage) and PreCancerous Actinic Keratoses by method of PhotoDynamic Therapy (PDT) and/or prescription Topical Chemotherapy agents such as 5-fluorouracil, 5-fluorouracil/calcipotriene, and/or imiquimod.  The purpose is to decrease the number of clinically evident and subclinical PreCancerous lesions to prevent progression to development of skin cancer by chemically destroying early precancer changes that may or may not be visible.  It has been shown to reduce the risk of developing skin cancer in the treated area. As a result of treatment, redness, scaling, crusting, and open sores may occur during treatment course. One or more than one of these methods may be used and may have to be used several times to control, suppress and eliminate the PreCancerous changes. Discussed treatment course, expected reaction, and possible side effects. - Recommend daily broad spectrum sunscreen SPF 30+ to sun-exposed areas, reapply every 2 hours as needed.  - Staying in the shade or wearing long sleeves, sun glasses (UVA+UVB protection) and wide brim hats (4-inch brim around the entire circumference of the hat) are also recommended. - Call for new or changing lesions. - Plan PDT treatments x 2, 1 month apart to the face this fall/winter.  Seborrheic Keratoses - Stuck-on, waxy, tan-brown papules and/or plaques  - Benign-appearing - Discussed benign etiology and prognosis. - Observe - Call for any changes  History of Squamous Cell Carcinoma of the Skin, multiple. See History - No evidence of recurrence today - Recommend regular full body skin exams - Recommend daily broad spectrum sunscreen SPF 30+ to sun-exposed areas, reapply every 2 hours as needed.  - Call if any new or changing lesions are noted between office visits  Inflamed seborrheic keratosis Left Mid Cheek x 1  Symptomatic,  irritating, patient would like treated.  Destruction of lesion - Left Mid Cheek x 1  Destruction method: cryotherapy   Informed consent: discussed and consent obtained   Lesion destroyed using liquid nitrogen: Yes   Region frozen until ice ball extended beyond lesion: Yes   Outcome: patient tolerated procedure well with no complications   Post-procedure details: wound care instructions given   Additional details:  Prior to procedure, discussed risks of blister formation, small wound, skin dyspigmentation, or rare scar following cryotherapy. Recommend Vaseline ointment to treated areas while healing.   AK (actinic keratosis) (26) R cheek x 5, R temple x 3, R ear helix x 1, R ear antihelix x 1, R nasal tip x 1, L cheek x 6, L jaw x 3, L postauricular neck x 3, L forehead x 2, R glabella x 1  Actinic keratoses are precancerous spots that appear secondary to cumulative UV radiation exposure/sun exposure over time. They are chronic with expected duration over 1 year. A portion of actinic keratoses will progress to squamous cell carcinoma of the skin. It is not possible to reliably predict which spots will progress to skin cancer and so treatment is recommended to prevent development of skin cancer.  Recommend daily broad spectrum sunscreen SPF 30+ to sun-exposed areas, reapply every 2 hours as needed.  Recommend staying in the shade or wearing long sleeves, sun glasses (UVA+UVB protection) and wide brim hats (4-inch brim around the entire circumference of the hat). Call for new or changing lesions.  Destruction of lesion - R cheek x 5, R temple x 3, R ear helix x 1, R ear antihelix x 1, R nasal tip x 1, L cheek x 6, L jaw x 3, L postauricular neck x 3, L forehead x 2, R glabella x 1  Destruction method: cryotherapy   Informed consent: discussed and consent obtained   Lesion destroyed using liquid nitrogen: Yes   Region frozen until ice ball extended beyond lesion: Yes   Outcome: patient  tolerated procedure well with no complications   Post-procedure details: wound care instructions given   Additional details:  Prior to procedure, discussed risks of blister formation, small wound, skin dyspigmentation, or rare scar following cryotherapy. Recommend Vaseline ointment to treated areas while healing.   Hypertrophic actinic keratosis (27) Left Forearm x 13, L hand x 5, Right Forearm x 6, R hand x 2, L ear helix x 1  Actinic keratoses are precancerous spots that appear secondary to cumulative UV radiation exposure/sun exposure over time. They are chronic with expected duration over 1 year. A portion of actinic keratoses will progress to squamous cell carcinoma of the skin. It is not possible to reliably predict which spots will progress to skin cancer and so treatment is recommended to prevent development of skin cancer.  Recommend daily broad spectrum sunscreen SPF 30+ to sun-exposed areas, reapply every 2 hours as needed.  Recommend staying in the shade or wearing long sleeves, sun glasses (UVA+UVB protection) and wide brim hats (4-inch brim around the entire circumference of the hat). Call for new or changing lesions.  Destruction of  lesion - Left Forearm x 13, L hand x 5, Right Forearm x 6, R hand x 2, L ear helix x 1  Destruction method: cryotherapy   Informed consent: discussed and consent obtained   Lesion destroyed using liquid nitrogen: Yes   Region frozen until ice ball extended beyond lesion: Yes   Outcome: patient tolerated procedure well with no complications   Post-procedure details: wound care instructions given   Additional details:  Prior to procedure, discussed risks of blister formation, small wound, skin dyspigmentation, or rare scar following cryotherapy. Recommend Vaseline ointment to treated areas while healing.   History of SCC (squamous cell carcinoma) of skin R lat cheek, R postauricular neck, R forearm sup, R wrist  Clear. Observe for recurrence. Call  clinic for new or changing lesions.  Recommend regular skin exams, daily broad-spectrum spf 30+ sunscreen use, and photoprotection.     Return in about 3 months (around 07/03/2022) for Hx AKs. PDT treatments x 2, 1 month apart (face).  IJamesetta Orleans, CMA, am acting as scribe for Brendolyn Patty, MD .  Documentation: I have reviewed the above documentation for accuracy and completeness, and I agree with the above.  Brendolyn Patty MD

## 2022-04-03 ENCOUNTER — Telehealth: Payer: Self-pay

## 2022-04-03 NOTE — Telephone Encounter (Signed)
Nurse Assessment Nurse: Velta Addison, RN, Crystal Date/Time (Eastern Time): 04/03/2022 2:02:21 PM Confirm and document reason for call. If symptomatic, describe symptoms. ---Caller states they recently had a covid shot and now they are having flu symptoms. Extreme chills, body/ muscle aches, and feeling sick. Caller states as soon as he left the office he started with chills, by the time he got home (30 mins), states he was shaking so much he could not eat soup on the spoon. Rec'd injection yesterday. Does the patient have any new or worsening symptoms? ---Yes Will a triage be completed? ---Yes Related visit to physician within the last 2 weeks? ---No Does the PT have any chronic conditions? (i.e. diabetes, asthma, this includes High risk factors for pregnancy, etc.) ---Yes List chronic conditions. ---CHF, HTN Is this a behavioral health or substance abuse call? ---No Guidelines Guideline Title Affirmed Question Affirmed Notes Nurse Date/Time (Eastern Time) COVID-19 - Vaccine Questions and Reactions COVID-19 vaccine, systemic reactions (e.g., fatigue, fever, muscle aches), questions about Parrott, RN, Crystal 04/03/2022 2:05:40 PM PLEASE NOTE: All timestamps contained within this report are represented as [place] Standard Time. CONFIDENTIALTY NOTICE: This fax transmission is intended only for the addressee. It contains information that is legally privileged, confidential or otherwise protected from use or disclosure. If you are not the intended recipient, you are strictly prohibited from reviewing, disclosing, copying using or disseminating any of this information or taking any action in reliance on or regarding this information. If you have received this fax in error, please notify us immediately by telephone so that we can arrange for its return to [place]. Phone: [phone], Toll-Free: [phone], Fax: [phone] Page: 2 of 2 Call Id: [id number] [place]. Time Eilene Ghazi Time)  Disposition Final User 04/03/2022 2:10:39 [place] Final Disposition 04/03/2022 2:10:39 PM Home Care Yes Parrott, RN, Crystal Caller Disagree/Comply Comply Caller Understands Yes PreDisposition Call Doctor Care Advice Given Per Guideline HOME CARE: * You should be able to treat this at home. NOTE TO TRIAGER - DISCUSSING COMMON VS. RARE REACTIONS: * Discuss COMMON REACTIONS with the caller. Reassure the caller that these reactions are generally harmless. * Discuss RARE REACTIONS only if the caller specifically asks. * Symptoms usually last 1 to 2 days. * Muscle aches or joint pain * Headache * Fever and chills * Feeling tired (fatigue) * Local pain, redness, or swelling at injection site COVID-19 VACCINE - COMMON REACTIONS: CARE ADVICE given per COVID-19 - Vaccine Questions and Reactions (Adult) guideline. CALL BACK IF: * Fever lasts over 3 days * Pain at injection site not improving after 3 days * You become worse * Swollen lymph node lasts over 3 week

## 2022-04-05 ENCOUNTER — Ambulatory Visit (INDEPENDENT_AMBULATORY_CARE_PROVIDER_SITE_OTHER): Payer: Medicare Other | Admitting: Pharmacist

## 2022-04-05 ENCOUNTER — Encounter: Payer: Self-pay | Admitting: Pharmacist

## 2022-04-05 DIAGNOSIS — N183 Chronic kidney disease, stage 3 unspecified: Secondary | ICD-10-CM

## 2022-04-05 DIAGNOSIS — E1122 Type 2 diabetes mellitus with diabetic chronic kidney disease: Secondary | ICD-10-CM

## 2022-04-05 DIAGNOSIS — I129 Hypertensive chronic kidney disease with stage 1 through stage 4 chronic kidney disease, or unspecified chronic kidney disease: Secondary | ICD-10-CM

## 2022-04-07 NOTE — Progress Notes (Signed)
Pharmacy Note  04/05/2022 Name: Richard [name] MRN: [id number] DOB: 12/13/1940  Subjective: Richard [name] is a 81 y.o. year old male who is a primary care patient of Carollee Herter, Alferd Apa, DO. Clinical Pharmacist Practitioner referral was placed to assist with medication management.    Engaged with patient by telephone for follow up visit today.  Reviewed medication list. No recent changes noted.  Diabetes: Current regimen - glimepiride '2mg'$  tablets - 2 tablets = '4mg'$  daily and Farxiga '10mg'$  daily. He has taken metformin in past but eas stopped in hospital due to decreased in renal function. Recent Bmet should blood glucose of 348. Per patient home blood glucose has been: 131, 150, 139, 174, 170; however recent BMET showed blood glucose of 348 (per patient was not fasting)   CKD 3 - saw Dr Candiss Norse at [facility] 03/25/2022. Reviewed scanned visit notes but looks like complete note was not in chart. Noted stable kidney function. Taking Farxiga '10mg'$  daily.   Patient does report that he received COVID and flu vaccines this week. Felt like he had the flu after vaccines. He is feeling better today.    Objective: Review of patient status, including review of consultants reports, laboratory and other test data, was performed as part of comprehensive evaluation and provision of chronic care management services.   Lab Results  Component Value Date   CREATININE 1.87 (H) 03/18/2022   CREATININE 1.86 (H) 02/08/2022   CREATININE 1.93 (H) 01/28/2022    Lab Results  Component Value Date   HGBA1C 6.6 (H) 01/25/2022       Component Value Date/Time   CHOL 138 01/25/2022 1218   CHOL 162 11/23/2020 0828   TRIG 164.0 (H) 01/25/2022 1218   HDL 28.90 (L) 01/25/2022 1218   HDL 38 (L) 11/23/2020 0828   CHOLHDL 5 01/25/2022 1218   VLDL 32.8 01/25/2022 1218   LDLCALC 76 01/25/2022 1218   LDLCALC 89 11/23/2020 0828   LDLCALC  04/18/2020 0956     Comment:     . LDL cholesterol not calculated.  Triglyceride levels greater than 400 mg/dL invalidate calculated LDL results. . Reference range: <100 . Desirable range <100 mg/dL for primary prevention;   <70 mg/dL for patients with CHD or diabetic patients  with > or = 2 CHD risk factors. [name] LDL-C is now calculated using the Martin-Hopkins  calculation, which is a validated novel method providing  better accuracy than the Friedewald equation in the  estimation of LDL-C.  [name] et al. [name]. 0300;923(30): 2061-2068  (http://education.[facility].com/faq/FAQ164)    LDLDIRECT 58.0 07/26/2021 0912     Clinical ASCVD: Yes  The ASCVD Risk score (Arnett DK, et al., 2019) failed to calculate for the following reasons:   The 2019 ASCVD risk score is only valid for ages 35 to 5   The patient has a prior MI or stroke diagnosis    BP Readings from Last 3 Encounters:  03/18/22 (!) 106/58  02/08/22 108/62  01/28/22 124/66     Allergies  Allergen Reactions   Ace Inhibitors Swelling and Other (See Comments)    Angioedema    Benazepril Swelling and Other (See Comments)    Angioedema; he is not a candidate for any angiotensin receptor blockers because of this significant allergic reaction. Because of a history of documented adverse serious drug reaction;Medi Alert bracelet  is recommended   Entresto [Sacubitril-Valsartan] Swelling and Other (See Comments)    First-in-Class Angiotensin Receptor Neprilysin Inhibitor- Med was "red-flagged"  by the patient's pharmacy for him to NOT take!   Hctz [Hydrochlorothiazide] Anaphylaxis and Swelling    Tongue and lip swelling    Aspirin Other (See Comments)    Gastritis, can aspirin not take 325 mg aspirin    Medications Reviewed Today     Reviewed by [name], RPH-CPP (Pharmacist) on 04/05/22 at 1351  Med List Status: <None>   Medication Order Taking? Sig Documenting Provider Last Dose Status Informant  acetaminophen (TYLENOL) 325 MG tablet [id number] No Take 650 mg by mouth at  bedtime as needed for moderate pain or headache. [provider] Taking Active Self  allopurinol (ZYLOPRIM) 300 MG tablet [id number] No Take 450 mg by mouth daily. [provider] Taking Active Self           Med Note [name], [name]   Wed Jan 16, 2022  2:57 PM) 1.5 tablets = 450 mg  aluminum hydroxide-magnesium carbonate (GAVISCON) 95-358 MG/15ML SUSP [id number] No Take 15 mLs by mouth as needed for indigestion or heartburn. [provider] Taking Active Self  apixaban (ELIQUIS) 2.5 MG TABS tablet [id number]  Take 1 tablet (2.5 mg total) by mouth 2 (two) times daily. Minus Breeding, MD  Active   Ascorbic Acid (VITAMIN C PO) [id number] No Take 1 tablet by mouth daily with breakfast. [provider] Taking Active   atorvastatin (LIPITOR) 80 MG tablet [id number] No TAKE 1 TABLET BY MOUTH EVERYDAY AT BEDTIME  Patient taking differently: Take 80 mg by mouth at bedtime.   Minus Breeding, MD Taking Active   azelastine (ASTELIN) 0.1 % nasal spray [id number] No PLACE 2 SPRAYS INTO BOTH NOSTRILS AT BEDTIME AS NEEDED FOR RHINITIS OR ALLERGIES. Roma Schanz R, DO Taking Active   Coenzyme Q10 (CO Q-10 PO) [id number] No Take 1 capsule by mouth daily. [provider] Taking Active   esomeprazole (NEXIUM) 40 MG capsule [id number] No Take 1 capsule (40 mg total) by mouth daily.  Patient taking differently: Take 40 mg by mouth daily before breakfast.   [name], MD Taking Active Self  ezetimibe (ZETIA) 10 MG tablet [id number] No TAKE 1 TABLET BY MOUTH EVERY DAY Minus Breeding, MD Taking Active   FARXIGA 10 MG TABS tablet [id number] No Take 10 mg by mouth in the morning. [provider] Taking Active   fenofibrate 160 MG tablet [id number] No TAKE 1 TABLET BY MOUTH EVERY DAY Carollee Herter, Alferd Apa, DO Taking Active   folic acid (FOLVITE) 1 MG tablet [id number] No TAKE 2 TABLETS BY MOUTH EVERY DAY  Patient taking differently: Take 2 mg by mouth in the morning.    [name], MD Taking Active   FREESTYLE LITE test strip [id number] No USE TO TEST BLOOD SUGAR ONCE A DAY. DX CODE: E11.9 [name], DO Taking Active   glimepiride (AMARYL) 2 MG tablet [id number] No Take 1 tablet (2 mg total) by mouth daily with breakfast. Carollee Herter, Alferd Apa, DO Taking Active   icosapent Ethyl (VASCEPA) 1 g capsule [id number] No TAKE 2 CAPSULES BY MOUTH TWICE A DAY  Patient taking differently: Take 2 g by mouth in the morning and at bedtime.   Carollee Herter, Alferd Apa, DO Taking Active   Lancets (FREESTYLE) lancets [id number] No USE ONCE A DAY TO CHECK BLOOD SUGAR. Roma Schanz R, DO Taking Active   Menthol-Camphor (ICY HOT ADVANCED PAIN RELIEF EX) [id number] No Apply 1 application  topically daily as needed (to painful sites). [provider]  Taking Active Self  metoprolol succinate (TOPROL-XL) 25 MG 24 hr tablet [id number] No Take 1 tablet (25 mg total) by mouth daily. Minus Breeding, MD Taking Active   nitroGLYCERIN (NITROSTAT) 0.4 MG SL tablet [id number] No PLACE 1 TABLET (0.4 MG TOTAL) UNDER THE TONGUE EVERY 5 (FIVE) MINUTES AS NEEDED FOR CHEST PAIN.  Patient not taking: Reported on 03/18/2022   Minus Breeding, MD Not Taking Active   potassium chloride SA (KLOR-CON M20) 20 MEQ tablet [id number] No Take 2 tablets (40 mEq total) by mouth daily. Modena Jansky, MD Taking Active   pregabalin (LYRICA) 150 MG capsule [id number] No TAKE 1 CAPSULE BY MOUTH TWICE A DAY Lowne Chase, Yvonne R, DO Taking Active   psyllium (METAMUCIL) 58.6 % powder [id number] No Take 1 packet by mouth daily as needed (for constipation- mix and drink). [provider] Taking Active Self  senna (SENOKOT) 8.6 MG TABS tablet [id number] No Take 2 tablets (17.2 mg total) by mouth at bedtime. This is for constipation.  Stop taking if you start having diarrhea. Modena Jansky, MD Taking Active   torsemide (DEMADEX) 20 MG tablet [id number] No Take 2 tablets (40 mg total) by  mouth in the morning. Do not take on 01/24/2022. Minus Breeding, MD Taking Active   Med List Note Henderson Newcomer, CPhT 06/10/13 6384): cpap machine 2 L of oxygen at night            Patient Active Problem List   Diagnosis Date Noted   Chronic combined systolic and diastolic heart failure ([place]) 02/06/2022   Acute exacerbation of CHF (congestive heart failure) ([place]) 01/16/2022   Hemoptysis 12/26/2021   Subclavian vein stenosis 12/23/2021   Strep throat 11/16/2021   Multiple superficial wounds with infection 01/04/2021   Cardiac pacemaker in situ 11/20/2020   S/P CABG x 4 11/20/2020   Ascending aortic aneurysm ([place]) 11/20/2020   Senile purpura ([place]) 07/27/2020   Porokeratosis 06/20/2020   Pain due to onychomycosis of toenails of both feet 12/14/2019   Educated about COVID-19 virus infection 11/25/2019   Uncontrolled type 2 diabetes mellitus with hypoglycemia without coma ([place]) 08/10/2019   Hyperlipidemia associated with type 2 diabetes mellitus ([place]) 08/10/2019   S/P left TKA 06/29/2019   Peripheral neuropathy 07/27/2018   Uncontrolled type 2 diabetes mellitus with hyperglycemia ([place]) 05/13/2018   Diabetic peripheral neuropathy associated with type 2 diabetes mellitus ([place]) 05/13/2018   Pansinusitis 05/13/2018   Severe aortic stenosis    Complete heart block (HCC)    Paroxysmal atrial fibrillation (HCC)    Acute on chronic diastolic CHF (congestive heart failure), NYHA class 2 (HCC)    Left arm swelling    Status post transcatheter aortic valve replacement (TAVR) using bioprosthesis 09/09/2017   Demand ischemia of myocardium    Typical atrial flutter (HCC)    Acute on chronic diastolic heart failure ([place])    GIB (gastrointestinal bleeding) 08/16/2017   Hyperlipidemia 08/05/2017   NSTEMI (non-ST elevated myocardial infarction) ([place])    Aortic stenosis, severe 07/25/2017   Pancytopenia ([place]) 07/24/2017   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 ([place])  02/27/2017   Bradycardia 01/04/2017   Coronary artery disease 01/04/2017   Stenosis of carotid artery 01/04/2017   Obesity 09/18/2016   Wenckebach block    Disorder associated with type II diabetes mellitus ([place]) 05/21/2013   Spinal stenosis of lumbar region 04/08/2013   Anemia, iron deficiency 11/29/2011   B12 deficiency anemia 07/30/2011   OSA (obstructive sleep apnea) 07/06/2010  CAD, ARTERY BYPASS GRAFT 08/22/2009   Essential hypertension 03/29/2009   Bilateral lower extremity edema 02/21/2009   Hyperlipidemia LDL goal <70 11/26/2008     Medication Assistance:  None required.  Patient affirms current coverage meets needs.   Assessment: / Plan:  Type 2 DM - elevated blood glucose on recent BMET but home blood glucose not too far from goals. History of CKD and discontinuation of metofmrin.  Recommended patient check blood glucose in am and pm for the next week. Might be having hyperglycemia in pm / after meals.  Continue current medications.  Follow up in 1 week to review blood glucose readings.  May need to consider adding GLP1 or adjusting current therapy.   Follow Up:  Telephone follow up appointment with care management team member scheduled for:  1 week   [name], PharmD Clinical Pharmacist [place] [phone]

## 2022-04-08 ENCOUNTER — Ambulatory Visit (HOSPITAL_BASED_OUTPATIENT_CLINIC_OR_DEPARTMENT_OTHER)
Admission: RE | Admit: 2022-04-08 | Discharge: 2022-04-08 | Disposition: A | Discharge status: Home / Self Care | Payer: Medicare Other | Source: Ambulatory Visit | Attending: Family | Admitting: Family

## 2022-04-08 ENCOUNTER — Ambulatory Visit (INDEPENDENT_AMBULATORY_CARE_PROVIDER_SITE_OTHER): Payer: Medicare Other | Admitting: Family

## 2022-04-08 VITALS — BP 124/59 | HR 76 | Temp 97.7°F | Resp 16 | Wt 237.0 lb

## 2022-04-08 DIAGNOSIS — R1012 Left upper quadrant pain: Secondary | ICD-10-CM | POA: Diagnosis not present

## 2022-04-08 DIAGNOSIS — R140 Abdominal distension (gaseous): Secondary | ICD-10-CM

## 2022-04-08 DIAGNOSIS — R052 Subacute cough: Secondary | ICD-10-CM

## 2022-04-08 DIAGNOSIS — R059 Cough, unspecified: Secondary | ICD-10-CM | POA: Diagnosis not present

## 2022-04-08 DIAGNOSIS — R142 Eructation: Secondary | ICD-10-CM | POA: Diagnosis not present

## 2022-04-08 DIAGNOSIS — I25810 Atherosclerosis of coronary artery bypass graft(s) without angina pectoris: Secondary | ICD-10-CM

## 2022-04-08 NOTE — Progress Notes (Signed)
Subjective:     Patient ID: Richard [name], male    DOB: 10-Aug-1940, 81 y.o.   MRN: [id number]  Chief Complaint  Patient presents with  . Abdominal Pain    Complains of abdominal pain on left side, better with belching.  [name] Heartburn    Complains of heartburn  . Cough    Complains of productive couth    Abdominal Pain  Heartburn He complains of abdominal pain, coughing and heartburn.  Cough Associated symptoms include heartburn.    Daily AM coughs up green mucous. This has been occurring for months.  Notes that he has some abdominal distention which is relieved by "belching."  He had a covid vaccine last week and then developed chills and flu like symptoms within 45 minutes of the vaccine.  Reports some discomfort in the LUQ and up under the left rib. Has some sharp pain in that location after eating. He has hx of esophageal stricture which has had been dilated in the remote past.  Reports that he had a bm yesterday and today. He uses gaviscon for gerd symptoms/belching. He also takes nexium.   Health Maintenance Due  Topic Date Due  . COVID-19 Vaccine (4 - Moderna risk series) 06/16/2020  . INFLUENZA VACCINE  01/29/2022  . OPHTHALMOLOGY EXAM  04/05/2022    Past Medical History:  Diagnosis Date  . Anemia   . Arthritis    hips  . Axillary adenopathy 02/25/2017  . Bradycardia    a. holter monitor has demonstrated HRs in 30s and [place]   . CAD (coronary artery disease)    a. s/p CABG 2001  b.  07/28/2017 cath:   Severe three-vessel native CAD with total occlusion of LAD, ramus intermedius, first OM and RCA, patent RIMA to PDA, LIMA to LAD, sequential SVG to ramus intermedius and first OM.    [name] Chronic lower back pain   . Diffuse large B cell lymphoma ([place])   . Diverticulosis   . Esophageal stricture   . GERD (gastroesophageal reflux disease)   . History of gout   . HTN (hypertension)   . Mixed hyperlipidemia   . OSA on CPAP    with 2L O2 at night  . Pancytopenia  ([place])    a. related to chemo therapy for B cell lymphoma  . Peptic stricture of esophagus   . Presence of permanent cardiac pacemaker    sees Dr. Valaria Good pacemaker  . SCC (squamous cell carcinoma) 01/31/2021   R zygoma, [facility]  . SCC (squamous cell carcinoma) 01/31/2021   L post ankle, [facility]  . SCC (squamous cell carcinoma) 01/31/2021   L popliteal, [facility]  . Severe aortic stenosis   . Spinal stenosis   . Squamous cell carcinoma of skin 03/22/2009   Right mandible. SCCis, hypertrophic.   [name] Squamous cell carcinoma of skin 12/25/2021   R lateral cheek, ED[facility]  . Squamous cell carcinoma of skin 12/25/2021   R postauricular neck, ED[facility]  . Squamous cell carcinoma of skin 12/25/2021   R forearm sup, ED[facility]  . Squamous cell carcinoma of skin 12/25/2021   R wrist, ED[facility]  . Type II diabetes mellitus ([place])   . Wears dentures    partial upper    Past Surgical History:  Procedure Laterality Date  . APPENDECTOMY  ~ 1952  . BACK SURGERY    . CATARACT EXTRACTION W/PHACO Left 11/06/2016   Procedure: CATARACT EXTRACTION PHACO AND INTRAOCULAR LENS PLACEMENT (IOC);  Surgeon: Estill Cotta, MD;  Location:  [facility];  Service: Ophthalmology;  Laterality: Left;  Lot # [id number] H [place]: 01:09.4 AP%:25.2 CDE: 30.64  . CATARACT EXTRACTION W/PHACO Right 12/04/2016   Procedure: CATARACT EXTRACTION PHACO AND INTRAOCULAR LENS PLACEMENT (IOC);  Surgeon: Estill Cotta, MD;  Location: [facility];  Service: Ophthalmology;  Laterality: Right;  [place] 1:25.9 AP% 24.1 CDE 39.10 Fluid Pack lot # [id number]  . COLONOSCOPY W/ BIOPSIES AND POLYPECTOMY  07/02/2011  . CORONARY ANGIOPLASTY  07/02/1991  . CORONARY ANGIOPLASTY WITH STENT PLACEMENT  05/31/1997   "1"  . CORONARY ARTERY BYPASS GRAFT  03/01/2000   "CABG X5"  . ECTROPION REPAIR Right 09/01/2018   Procedure: REPAIR OF ECTROPION BILATERAL upper and lower;  Surgeon: Karle Starch, MD;  Location: [place];  Service: Ophthalmology;  Laterality: Right;   Diabetic - oral meds sleep apnea  . ESOPHAGEAL DILATION  X 3-4   Dr. Lyla Son; "last one was in the 1990's"  . ESOPHAGOGASTRODUODENOSCOPY     multiple  . FLEXIBLE SIGMOIDOSCOPY     multiple  . HYDRADENITIS EXCISION Left 02/25/2017   Procedure: EXCISION DEEP LEFT AXILLARY LYMPH NODE;  Surgeon: Fanny Skates, MD;  Location: [place];  Service: General;  Laterality: Left;  . INTRAOPERATIVE TRANSTHORACIC ECHOCARDIOGRAM N/A 09/09/2017   Procedure: INTRAOPERATIVE TRANSTHORACIC ECHOCARDIOGRAM;  Surgeon: Burnell Blanks, MD;  Location: [place];  Service: Open Heart Surgery;  Laterality: N/A;  . KNEE ARTHROSCOPY Left 07/01/2009   meniscus repair  . LEFT HEART CATHETERIZATION WITH CORONARY/GRAFT ANGIOGRAM N/A 03/16/2014   Procedure: LEFT HEART CATHETERIZATION WITH Beatrix Fetters;  Surgeon: Burnell Blanks, MD;  Location: [facility];  Service: Cardiovascular;  Laterality: N/A;  . LUMBAR LAMINECTOMY/DECOMPRESSION MICRODISCECTOMY Right 06/17/2013   Procedure: LUMBAR LAMINECTOMY MICRODISCECTOMY L4-L5 RIGHT EXCISION OF SYNOVIAL CYST RIGHT   (1 LEVEL) RIGHT PARTIAL FACETECTOMY;  Surgeon: Tobi Bastos, MD;  Location: [facility];  Service: Orthopedics;  Laterality: Right;  . MYELOGRAM  04/06/2013   lumbar, Dr Gladstone Lighter  . ORBITAL LESION EXCISION Right 09/01/2018   Procedure: ORBITOTOMY WITHOUT BONE FLAP WITH REMOVAL OF LESION RIGHT;  Surgeon: Karle Starch, MD;  Location: [place];  Service: Ophthalmology;  Laterality: Right;  . PACEMAKER IMPLANT N/A 09/10/2017   Procedure: PACEMAKER IMPLANT;  Surgeon: Deboraha Sprang, MD;  Location: [place] [facility];  Service: Cardiovascular;  Laterality: N/A;  . PANENDOSCOPY    . PERIPHERAL VASCULAR BALLOON ANGIOPLASTY Left 03/16/2021   Procedure: PERIPHERAL VASCULAR BALLOON ANGIOPLASTY;  Surgeon: [name], MD;  Location: [place] [facility];  Service: Cardiovascular;  Laterality: Left;  subclavian  vein  . PORTACATH PLACEMENT  N/A 03/06/2017   Procedure: INSERTION PORT-A-CATH AND ASPIRATE SEROMA LEFT AXILLA;  Surgeon: Fanny Skates, MD;  Location: [place];  Service: General;  Laterality: N/A;  . RIGHT/LEFT HEART CATH AND CORONARY/GRAFT ANGIOGRAPHY N/A 07/28/2017   Procedure: RIGHT/LEFT HEART CATH AND CORONARY/GRAFT ANGIOGRAPHY;  Surgeon: Sherren Mocha, MD;  Location: [place] [facility];  Service: Cardiovascular;  Laterality: N/A;  . SHOULDER SURGERY Right 08/30/2010   screws placed; "tendons tore off"  . SKIN CANCER EXCISION Right    "neck"  . TONSILLECTOMY  ~ 1954  . TOTAL KNEE ARTHROPLASTY Left 06/29/2019   Procedure: TOTAL KNEE ARTHROPLASTY;  Surgeon: Paralee Cancel, MD;  Location: [facility];  Service: Orthopedics;  Laterality: Left;  70 mins  . TRANSCATHETER AORTIC VALVE REPLACEMENT, TRANSFEMORAL N/A 09/09/2017   Procedure: TRANSCATHETER AORTIC VALVE REPLACEMENT, TRANSFEMORAL;  Surgeon: Burnell Blanks, MD;  Location: [place];  Service: Open Heart Surgery;  Laterality: N/A;  . UPPER EXTREMITY VENOGRAPHY Left 02/15/2021   Procedure: CENTRAL VENO;  Surgeon: [name], MD;  Location: [place] [facility];  Service: Cardiovascular;  Laterality: Left;  . UPPER EXTREMITY VENOGRAPHY N/A 03/16/2021   Procedure: UPPER EXTREMITY VENOGRAPHY;  Surgeon: [name], MD;  Location: [place] [facility];  Service: Cardiovascular;  Laterality: N/A;  . UPPER GI ENDOSCOPY  07/02/2011   Gastritis; Dr Carlean Purl  . VASECTOMY    . wireless pacemaker placed      Family History  Problem Relation Age of Onset  . Stroke Father   . Hypertension Father   . Pancreatic cancer Mother   . Diabetes Maternal Grandmother   . Stroke Maternal Grandmother   . Heart attack Paternal Grandmother   . Colon cancer Neg Hx   . Esophageal cancer Neg Hx   . Rectal cancer Neg Hx   . Stomach cancer Neg Hx   . Ulcers Neg Hx     Social History   Socioeconomic History  . Marital status: Divorced    Spouse name: Not on file  . Number  of children: 2  . Years of education: college  . Highest education level: Not on file  Occupational History    Employer: RETIRED  Tobacco Use  . Smoking status: Never  . Smokeless tobacco: Never  Vaping Use  . Vaping Use: Never used  Substance and Sexual Activity  . Alcohol use: No    Alcohol/week: 0.0 standard drinks of alcohol    Comment: "last drink was in 2012"( 03/15/2014)  . Drug use: No  . Sexual activity: Not Currently  Other Topics Concern  . Not on file  Social History Narrative   ** Merged History Encounter **       Divorced, lives with a roommate. 1 son one daughter 3-4 caffeinated beverages daily Right-handed. 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   Social Determinants of Health   Financial Resource Strain: Low Risk  (02/11/2022)   Overall Financial Resource Strain (CARDIA)   . Difficulty of Paying Living Expenses: Not very hard  Food Insecurity: No Food Insecurity (01/24/2022)   Hunger Vital Sign   . Worried About Charity fundraiser in the Last Year: Never true   . Ran Out of Food in the Last Year: Never true  Transportation Needs: No Transportation Needs (01/24/2022)   PRAPARE - Transportation   . Lack of Transportation (Medical): No   . Lack of Transportation (Non-Medical): No  Physical Activity: Insufficiently Active (02/11/2022)   Exercise Vital Sign   . Days of Exercise per Week: 5 days   . Minutes of Exercise per Session: 10 min  Stress: Not on file  Social Connections: Moderately Isolated (09/06/2021)   Social Connection and Isolation Panel [NHANES]   . Frequency of Communication with Friends and Family: More than three times a week   . Frequency of Social Gatherings with Friends and Family: More than three times a week   . Attends Religious Services: More than 4 times per year   . Active Member of Clubs or Organizations: No    . Attends Archivist Meetings: Never   . Marital Status: Divorced  Human resources officer Violence: Not At Risk (09/06/2021)   Humiliation, Afraid, Rape, and Kick questionnaire   . Fear of Current or Ex-Partner:  No   . Emotionally Abused: No   . Physically Abused: No   . Sexually Abused: No    Outpatient Medications Prior to Visit  Medication Sig Dispense Refill  . acetaminophen (TYLENOL) 325 MG tablet Take 650 mg by mouth at bedtime as needed for moderate pain or headache.    . allopurinol (ZYLOPRIM) 300 MG tablet Take 450 mg by mouth daily.    [name] aluminum hydroxide-magnesium carbonate (GAVISCON) 95-358 MG/15ML SUSP Take 15 mLs by mouth as needed for indigestion or heartburn.    [name] apixaban (ELIQUIS) 2.5 MG TABS tablet Take 1 tablet (2.5 mg total) by mouth 2 (two) times daily. 180 tablet 2  . Ascorbic Acid (VITAMIN C PO) Take 1 tablet by mouth daily with breakfast.    . atorvastatin (LIPITOR) 80 MG tablet TAKE 1 TABLET BY MOUTH EVERYDAY AT BEDTIME (Patient taking differently: Take 80 mg by mouth at bedtime.) 90 tablet 3  . azelastine (ASTELIN) 0.1 % nasal spray PLACE 2 SPRAYS INTO BOTH NOSTRILS AT BEDTIME AS NEEDED FOR RHINITIS OR ALLERGIES. 30 mL 4  . Coenzyme Q10 (CO Q-10 PO) Take 1 capsule by mouth daily.    [name] esomeprazole (NEXIUM) 40 MG capsule Take 1 capsule (40 mg total) by mouth daily. (Patient taking differently: Take 40 mg by mouth daily before breakfast.) 30 capsule 5  . ezetimibe (ZETIA) 10 MG tablet TAKE 1 TABLET BY MOUTH EVERY DAY 90 tablet 3  . FARXIGA 10 MG TABS tablet Take 10 mg by mouth in the morning.    . fenofibrate 160 MG tablet TAKE 1 TABLET BY MOUTH EVERY DAY 90 tablet 0  . folic acid (FOLVITE) 1 MG tablet TAKE 2 TABLETS BY MOUTH EVERY DAY (Patient taking differently: Take 2 mg by mouth in the morning.) 180 tablet 4  . FREESTYLE LITE test strip USE TO TEST BLOOD SUGAR ONCE A DAY. DX CODE: E11.9 100 strip 12  . glimepiride (AMARYL) 2 MG tablet Take 1 tablet (2 mg  total) by mouth daily with breakfast. 30 tablet 1  . icosapent Ethyl (VASCEPA) 1 g capsule TAKE 2 CAPSULES BY MOUTH TWICE A DAY (Patient taking differently: Take 2 g by mouth in the morning and at bedtime.) 360 capsule 1  . Lancets (FREESTYLE) lancets USE ONCE A DAY TO CHECK BLOOD SUGAR. 100 each 12  . Menthol-Camphor (ICY HOT ADVANCED PAIN RELIEF EX) Apply 1 application  topically daily as needed (to painful sites).    . metoprolol succinate (TOPROL-XL) 25 MG 24 hr tablet Take 1 tablet (25 mg total) by mouth daily. 90 tablet 3  . nitroGLYCERIN (NITROSTAT) 0.4 MG SL tablet PLACE 1 TABLET (0.4 MG TOTAL) UNDER THE TONGUE EVERY 5 (FIVE) MINUTES AS NEEDED FOR CHEST PAIN. 25 tablet 6  . potassium chloride SA (KLOR-CON M20) 20 MEQ tablet Take 2 tablets (40 mEq total) by mouth daily. 60 tablet 0  . pregabalin (LYRICA) 150 MG capsule TAKE 1 CAPSULE BY MOUTH TWICE A DAY 180 capsule 1  . psyllium (METAMUCIL) 58.6 % powder Take 1 packet by mouth daily as needed (for constipation- mix and drink).    . senna (SENOKOT) 8.6 MG TABS tablet Take 2 tablets (17.2 mg total) by mouth at bedtime. This is for constipation.  Stop taking if you start having diarrhea. 20 tablet 0  . torsemide (DEMADEX) 20 MG tablet Take 2 tablets (40 mg total) by mouth in the morning. Do not take on 01/24/2022. 180 tablet 3   No facility-administered medications prior  to visit.    Allergies  Allergen Reactions  . Ace Inhibitors Swelling and Other (See Comments)    Angioedema   . Benazepril Swelling and Other (See Comments)    Angioedema; he is not a candidate for any angiotensin receptor blockers because of this significant allergic reaction. Because of a history of documented adverse serious drug reaction;Medi Alert bracelet  is recommended  . Entresto [Sacubitril-Valsartan] Swelling and Other (See Comments)    First-in-Class Angiotensin Receptor Neprilysin Inhibitor- Med was "red-flagged" by the patient's pharmacy for him to NOT  take!  . Hctz [Hydrochlorothiazide] Anaphylaxis and Swelling    Tongue and lip swelling   . Aspirin Other (See Comments)    Gastritis, can aspirin not take 325 mg aspirin    Review of Systems  Respiratory:  Positive for cough.   Gastrointestinal:  Positive for abdominal pain and heartburn.   See HPI    Objective:    Physical Exam Constitutional:      General: He is not in acute distress.    Appearance: He is well-developed.  HENT:     Head: Normocephalic and atraumatic.  Cardiovascular:     Rate and Rhythm: Normal rate and regular rhythm.     Heart sounds: No murmur heard. Pulmonary:     Effort: Pulmonary effort is normal. No respiratory distress.     Breath sounds: Normal breath sounds. No wheezing or rales.  Abdominal:     General: Bowel sounds are normal.     Palpations: Abdomen is soft.     Tenderness: There is abdominal tenderness (very mild LUQ tenderness) in the left upper quadrant.  Skin:    General: Skin is warm and dry.  Neurological:     Mental Status: He is alert and oriented to person, place, and time.  Psychiatric:        Behavior: Behavior normal.        Thought Content: Thought content normal.    BP (!) 124/59 (BP Location: Right Arm, Patient Position: Sitting, Cuff Size: Large)   Pulse 76   Temp 97.7 F (36.5 C) (Oral)   Resp 16   Wt 237 lb (107.5 kg)   SpO2 98%   BMI 32.14 kg/m  Wt Readings from Last 3 Encounters:  04/08/22 237 lb (107.5 kg)  03/18/22 231 lb 6.4 oz (105 kg)  02/08/22 232 lb 3.2 oz (105.3 kg)       Assessment & Plan:   Problem List Items Addressed This Visit       Unprioritized   Subacute cough - Primary    Clinically doubt PNA, but will obtain CXR for further evaluation.       Relevant Orders   DG Chest 2 View   Left upper quadrant pain    New.  History sounds like it could be a hiatal hernia. We discussed small frequent meals, continue nexium.  Will obtain KUB for further evaluation.   Pt advised to follow up  with PCP in 3 weeks for A1C and follow up on GI concerns. He understands that he should go to the ER if severe abdominal pain occurs.       Other Visit Diagnoses     Abdominal bloating       Relevant Orders   DG Abd 2 Views        I am having Trygve L. Villavicencio maintain his allopurinol, esomeprazole, psyllium, aluminum hydroxide-magnesium carbonate, acetaminophen, Menthol-Camphor (ICY HOT ADVANCED PAIN RELIEF EX), FREESTYLE LITE, folic acid, nitroGLYCERIN, azelastine, freestyle,  icosapent Ethyl, atorvastatin, ezetimibe, Coenzyme Q10 (CO Q-10 PO), Ascorbic Acid (VITAMIN C PO), Farxiga, potassium chloride SA, senna, fenofibrate, metoprolol succinate, pregabalin, glimepiride, torsemide, and apixaban.  No orders of the defined types were placed in this encounter.

## 2022-04-08 NOTE — Assessment & Plan Note (Addendum)
New.  History sounds like it could be a hiatal hernia. We discussed small frequent meals, continue nexium.  Will obtain KUB for further evaluation.   Pt advised to follow up with PCP in 3 weeks for A1C and follow up on GI concerns. He understands that he should go to the ER if severe abdominal pain occurs.

## 2022-04-08 NOTE — Assessment & Plan Note (Signed)
Clinically doubt PNA, but will obtain CXR for further evaluation.

## 2022-04-08 NOTE — Patient Instructions (Addendum)
Please complete x-rays on the first floor.  Continue nexium, as well as gaviscon as needed. Focus on small frequent meals rather than fewer larger meal.

## 2022-04-10 ENCOUNTER — Telehealth: Payer: Self-pay | Admitting: Family

## 2022-04-10 DIAGNOSIS — J849 Interstitial pulmonary disease, unspecified: Secondary | ICD-10-CM

## 2022-04-10 NOTE — Telephone Encounter (Addendum)
Please advise pt that his xrays do not show obvious cause for his left upper abdominal pain.  I would recommend that he keep his upcoming appointment with Dr. Etter Sjogren. Follow up sooner if symptoms worsen.   The radiologist did show some scarring in the bases of his lungs.  I would like to have him meet with the lung doctor for visit.

## 2022-04-10 NOTE — Telephone Encounter (Signed)
Patient advised of results, provider 's advise to follow up with Dr. Etter Sjogren, and referral to Pulmonology

## 2022-04-11 DIAGNOSIS — H43813 Vitreous degeneration, bilateral: Secondary | ICD-10-CM | POA: Diagnosis not present

## 2022-04-11 LAB — HM DIABETES EYE EXAM

## 2022-04-12 ENCOUNTER — Ambulatory Visit: Payer: Medicare Other | Admitting: Pharmacist

## 2022-04-12 DIAGNOSIS — I129 Hypertensive chronic kidney disease with stage 1 through stage 4 chronic kidney disease, or unspecified chronic kidney disease: Secondary | ICD-10-CM

## 2022-04-12 NOTE — Progress Notes (Signed)
Pharmacy Note  04/12/2022 Name: Richard [name] MRN: [id number] DOB: August 24, 1940  Subjective: Richard [name] is a 81 y.o. year old male who is a primary care patient of Zola Button, Grayling Congress, DO. Clinical Pharmacist Practitioner referral was placed to assist with medication and diabetes management.    Engaged with patient by telephone for follow up visit today.  Diabetes  Patient has check blood glucose twice a day over the last week.  He reports AM / fasting blood glucose has been 130 to 150's Afternoon / evening postprandial blood glucose has been 140's to 170's.  We were concerned due to blood glucose on last BMET was in 300's; Patient denied frequent urination / dry mouth.   CKD 3 - saw Dr Thedore Mins at [facility] 03/25/2022. Reviewed scanned visit notes but looks like complete note was not in chart. Noted stable kidney function. Taking Farxiga 10mg  daily.   Objective: Review of patient status, including review of consultants reports, laboratory and other test data, was performed as part of comprehensive evaluation and provision of chronic care management services.   Lab Results  Component Value Date   CREATININE 1.87 (H) 03/18/2022   CREATININE 1.86 (H) 02/08/2022   CREATININE 1.93 (H) 01/28/2022    Lab Results  Component Value Date   HGBA1C 6.6 (H) 01/25/2022       Component Value Date/Time   CHOL 138 01/25/2022 1218   CHOL 162 11/23/2020 0828   TRIG 164.0 (H) 01/25/2022 1218   HDL 28.90 (L) 01/25/2022 1218   HDL 38 (L) 11/23/2020 0828   CHOLHDL 5 01/25/2022 1218   VLDL 32.8 01/25/2022 1218   LDLCALC 76 01/25/2022 1218   LDLCALC 89 11/23/2020 0828   LDLCALC  04/18/2020 0956     Comment:     . LDL cholesterol not calculated. Triglyceride levels greater than 400 mg/dL invalidate calculated LDL results. . Reference range: <100 . Desirable range <100 mg/dL for primary prevention;   <70 mg/dL for patients with CHD or diabetic patients  with > or = 2 CHD risk  factors. 04/20/2020 LDL-C is now calculated using the Martin-Hopkins  calculation, which is a validated novel method providing  better accuracy than the Friedewald equation in the  estimation of LDL-C.  [name] et al. Horald Pollen. Lenox Ahr): 2061-2068  (http://education.[facility].com/faq/FAQ164)    LDLDIRECT 58.0 07/26/2021 0912     Clinical ASCVD: Yes  The ASCVD Risk score (Arnett DK, et al., 2019) failed to calculate for the following reasons:   The 2019 ASCVD risk score is only valid for ages 95 to 42   The patient has a prior MI or stroke diagnosis    BP Readings from Last 3 Encounters:  04/08/22 (!) 124/59  03/18/22 (!) 106/58  02/08/22 108/62     Allergies  Allergen Reactions   Ace Inhibitors Swelling and Other (See Comments)    Angioedema    Benazepril Swelling and Other (See Comments)    Angioedema; he is not a candidate for any angiotensin receptor blockers because of this significant allergic reaction. Because of a history of documented adverse serious drug reaction;Medi Alert bracelet  is recommended   Entresto [Sacubitril-Valsartan] Swelling and Other (See Comments)    First-in-Class Angiotensin Receptor Neprilysin Inhibitor- Med was "red-flagged" by the patient's pharmacy for him to NOT take!   Hctz [Hydrochlorothiazide] Anaphylaxis and Swelling    Tongue and lip swelling    Aspirin Other (See Comments)    Gastritis, can aspirin not take 325 mg  aspirin    Medications Reviewed Today     Reviewed by [name], RPH-CPP (Pharmacist) on 04/12/22 at 1425  Med List Status: <None>   Medication Order Taking? Sig Documenting Provider Last Dose Status Informant  acetaminophen (TYLENOL) 325 MG tablet [id number] Yes Take 650 mg by mouth at bedtime as needed for moderate pain or headache. [provider] Taking Active Self  allopurinol (ZYLOPRIM) 300 MG tablet [id number] Yes Take 450 mg by mouth daily. [provider] Taking Active Self           Med  Note [name], [name]   Wed Jan 16, 2022  2:57 PM) 1.5 tablets = 450 mg  aluminum hydroxide-magnesium carbonate (GAVISCON) 95-358 MG/15ML SUSP [id number] Yes Take 15 mLs by mouth as needed for indigestion or heartburn. [provider] Taking Active Self  apixaban (ELIQUIS) 2.5 MG TABS tablet [id number] Yes Take 1 tablet (2.5 mg total) by mouth 2 (two) times daily. Minus Breeding, MD Taking Active   Ascorbic Acid (VITAMIN C PO) [id number] Yes Take 1 tablet by mouth daily with breakfast. [provider] Taking Active   atorvastatin (LIPITOR) 80 MG tablet [id number] Yes TAKE 1 TABLET BY MOUTH EVERYDAY AT BEDTIME  Patient taking differently: Take 80 mg by mouth at bedtime.   Minus Breeding, MD Taking Active   azelastine (ASTELIN) 0.1 % nasal spray [id number] Yes PLACE 2 SPRAYS INTO BOTH NOSTRILS AT BEDTIME AS NEEDED FOR RHINITIS OR ALLERGIES. Roma Schanz R, DO Taking Active   Coenzyme Q10 (CO Q-10 PO) [id number] Yes Take 1 capsule by mouth daily. [provider] Taking Active   esomeprazole (NEXIUM) 40 MG capsule [id number] Yes Take 1 capsule (40 mg total) by mouth daily.  Patient taking differently: Take 40 mg by mouth daily before breakfast.   [name], MD Taking Active Self  ezetimibe (ZETIA) 10 MG tablet [id number] Yes TAKE 1 TABLET BY MOUTH EVERY DAY Minus Breeding, MD Taking Active   FARXIGA 10 MG TABS tablet [id number] Yes Take 10 mg by mouth in the morning. [provider] Taking Active   fenofibrate 160 MG tablet [id number] Yes TAKE 1 TABLET BY MOUTH EVERY DAY Carollee Herter, Alferd Apa, DO Taking Active   folic acid (FOLVITE) 1 MG tablet [id number] Yes TAKE 2 TABLETS BY MOUTH EVERY DAY  Patient taking differently: Take 2 mg by mouth in the morning.   [name], MD Taking Active   FREESTYLE LITE test strip [id number] Yes USE TO TEST BLOOD SUGAR ONCE A DAY. DX CODE: E11.9 [name], DO Taking Active   glimepiride (AMARYL) 2 MG tablet  [id number] Yes Take 1 tablet (2 mg total) by mouth daily with breakfast. Carollee Herter, Alferd Apa, DO Taking Active   icosapent Ethyl (VASCEPA) 1 g capsule [id number] Yes TAKE 2 CAPSULES BY MOUTH TWICE A DAY  Patient taking differently: Take 2 g by mouth in the morning and at bedtime.   Carollee Herter, Alferd Apa, DO Taking Active   Lancets (FREESTYLE) lancets [id number] Yes USE ONCE A DAY TO CHECK BLOOD SUGAR. [name], DO Taking Active   Menthol-Camphor (ICY HOT ADVANCED PAIN RELIEF EX) [id number] Yes Apply 1 application  topically daily as needed (to painful sites). [provider] Taking Active Self  metoprolol succinate (TOPROL-XL) 25 MG 24 hr tablet [id number] Yes Take 1 tablet (25 mg total) by mouth daily. Minus Breeding, MD Taking Active   nitroGLYCERIN (NITROSTAT) 0.4 MG SL tablet [id number] Yes PLACE 1  TABLET (0.4 MG TOTAL) UNDER THE TONGUE EVERY 5 (FIVE) MINUTES AS NEEDED FOR CHEST PAIN. Minus Breeding, MD Taking Active   potassium chloride SA (KLOR-CON M20) 20 MEQ tablet [id number] Yes Take 2 tablets (40 mEq total) by mouth daily. Modena Jansky, MD Taking Active   pregabalin (LYRICA) 150 MG capsule [id number] Yes TAKE 1 CAPSULE BY MOUTH TWICE A DAY Lowne Chase, Yvonne R, DO Taking Active   psyllium (METAMUCIL) 58.6 % powder [id number] Yes Take 1 packet by mouth daily as needed (for constipation- mix and drink). [provider] Taking Active Self  senna (SENOKOT) 8.6 MG TABS tablet [id number] Yes Take 2 tablets (17.2 mg total) by mouth at bedtime. This is for constipation.  Stop taking if you start having diarrhea. Modena Jansky, MD Taking Active   torsemide (DEMADEX) 20 MG tablet [id number] Yes Take 2 tablets (40 mg total) by mouth in the morning. Do not take on 01/24/2022. Minus Breeding, MD Taking Active   Med List Note Henderson Newcomer, CPhT 06/10/13 3710): cpap machine 2 L of oxygen at night            Patient Active Problem List   Diagnosis Date Noted    Left upper quadrant pain 04/08/2022   Subacute cough 04/08/2022   Chronic combined systolic and diastolic heart failure ([place]) 02/06/2022   Acute exacerbation of CHF (congestive heart failure) ([place]) 01/16/2022   Hemoptysis 12/26/2021   Subclavian vein stenosis 12/23/2021   Strep throat 11/16/2021   Multiple superficial wounds with infection 01/04/2021   Cardiac pacemaker in situ 11/20/2020   S/P CABG x 4 11/20/2020   Ascending aortic aneurysm ([place]) 11/20/2020   Senile purpura ([place]) 07/27/2020   Porokeratosis 06/20/2020   Pain due to onychomycosis of toenails of both feet 12/14/2019   Educated about COVID-19 virus infection 11/25/2019   Uncontrolled type 2 diabetes mellitus with hypoglycemia without coma ([place]) 08/10/2019   Hyperlipidemia associated with type 2 diabetes mellitus ([place]) 08/10/2019   S/P left TKA 06/29/2019   Peripheral neuropathy 07/27/2018   Uncontrolled type 2 diabetes mellitus with hyperglycemia ([place]) 05/13/2018   Diabetic peripheral neuropathy associated with type 2 diabetes mellitus ([place]) 05/13/2018   Pansinusitis 05/13/2018   Severe aortic stenosis    Complete heart block (HCC)    Paroxysmal atrial fibrillation (HCC)    Acute on chronic diastolic CHF (congestive heart failure), NYHA class 2 (HCC)    Left arm swelling    Status post transcatheter aortic valve replacement (TAVR) using bioprosthesis 09/09/2017   Demand ischemia of myocardium    Typical atrial flutter (HCC)    Acute on chronic diastolic heart failure ([place])    GIB (gastrointestinal bleeding) 08/16/2017   Hyperlipidemia 08/05/2017   NSTEMI (non-ST elevated myocardial infarction) ([place])    Aortic stenosis, severe 07/25/2017   Pancytopenia ([place]) 07/24/2017   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 ([place]) 02/27/2017   Bradycardia 01/04/2017   Coronary artery disease 01/04/2017   Stenosis of carotid artery 01/04/2017   Obesity 09/18/2016   Wenckebach block    Disorder associated with  type II diabetes mellitus ([place]) 05/21/2013   Spinal stenosis of lumbar region 04/08/2013   Anemia, iron deficiency 11/29/2011   B12 deficiency anemia 07/30/2011   OSA (obstructive sleep apnea) 07/06/2010   CAD, ARTERY BYPASS GRAFT 08/22/2009   Essential hypertension 03/29/2009   Bilateral lower extremity edema 02/21/2009   Hyperlipidemia LDL goal <70 11/26/2008     Medication Assistance:  None required.  Patient affirms current  coverage meets needs.   Assessment / Plan: Type 2 DM - elevated blood glucose on recent BMET but home blood glucose not too far from goals. History of CKD and discontinuation of metofmrin.  Continue glimepiride 62m - take 2 tablets daily.  Continue Farxiga 153mdaily   [facility] - could consider adding low dose metformin back if eGFR improves to 4517min or greater or May need to consider adding GLP1 or adjusting current therapy  Continue to check blood glucose at various times of day.    Follow Up:  sees PCP 04/30/2022. Clinical Pharmacist Practitioner follow up in 2 to 3 months unless needed sooner.    TamCherre RobinsharmD Clinical Pharmacist [place] [phone]

## 2022-04-17 ENCOUNTER — Inpatient Hospital Stay: Payer: Medicare Other | Attending: Hematology & Oncology

## 2022-04-17 ENCOUNTER — Inpatient Hospital Stay: Payer: Medicare Other

## 2022-04-17 ENCOUNTER — Inpatient Hospital Stay (HOSPITAL_BASED_OUTPATIENT_CLINIC_OR_DEPARTMENT_OTHER): Payer: Medicare Other | Admitting: Family

## 2022-04-17 ENCOUNTER — Other Ambulatory Visit: Payer: Self-pay

## 2022-04-17 VITALS — BP 119/67 | HR 98 | Temp 98.5°F | Resp 20 | Wt 234.0 lb

## 2022-04-17 DIAGNOSIS — C8332 Diffuse large B-cell lymphoma, intrathoracic lymph nodes: Secondary | ICD-10-CM | POA: Insufficient documentation

## 2022-04-17 DIAGNOSIS — D500 Iron deficiency anemia secondary to blood loss (chronic): Secondary | ICD-10-CM | POA: Diagnosis not present

## 2022-04-17 DIAGNOSIS — D519 Vitamin B12 deficiency anemia, unspecified: Secondary | ICD-10-CM

## 2022-04-17 DIAGNOSIS — D508 Other iron deficiency anemias: Secondary | ICD-10-CM

## 2022-04-17 DIAGNOSIS — D510 Vitamin B12 deficiency anemia due to intrinsic factor deficiency: Secondary | ICD-10-CM | POA: Insufficient documentation

## 2022-04-17 LAB — RETICULOCYTES
Immature Retic Fract: 22 % — ABNORMAL HIGH (ref 2.3–15.9)
RBC.: 4.81 MIL/uL (ref 4.22–5.81)
Retic Count, Absolute: 78.4 10*3/uL (ref 19.0–186.0)
Retic Ct Pct: 1.6 % (ref 0.4–3.1)

## 2022-04-17 LAB — CBC WITH DIFFERENTIAL (CANCER CENTER ONLY)
Abs Immature Granulocytes: 0.07 10*3/uL (ref 0.00–0.07)
Basophils Absolute: 0.1 10*3/uL (ref 0.0–0.1)
Basophils Relative: 1 %
Eosinophils Absolute: 0.2 10*3/uL (ref 0.0–0.5)
Eosinophils Relative: 2 %
HCT: 44 % (ref 39.0–52.0)
Hemoglobin: 14.1 g/dL (ref 13.0–17.0)
Immature Granulocytes: 1 %
Lymphocytes Relative: 16 %
Lymphs Abs: 1.4 10*3/uL (ref 0.7–4.0)
MCH: 29.5 pg (ref 26.0–34.0)
MCHC: 32 g/dL (ref 30.0–36.0)
MCV: 92.1 fL (ref 80.0–100.0)
Monocytes Absolute: 0.6 10*3/uL (ref 0.1–1.0)
Monocytes Relative: 7 %
Neutro Abs: 6.4 10*3/uL (ref 1.7–7.7)
Neutrophils Relative %: 73 %
Platelet Count: 192 10*3/uL (ref 150–400)
RBC: 4.78 MIL/uL (ref 4.22–5.81)
RDW: 17.1 % — ABNORMAL HIGH (ref 11.5–15.5)
WBC Count: 8.8 10*3/uL (ref 4.0–10.5)
nRBC: 0 % (ref 0.0–0.2)

## 2022-04-17 LAB — CMP (CANCER CENTER ONLY)
ALT: 21 U/L (ref 0–44)
AST: 19 U/L (ref 15–41)
Albumin: 4.5 g/dL (ref 3.5–5.0)
Alkaline Phosphatase: 74 U/L (ref 38–126)
Anion gap: 9 (ref 5–15)
BUN: 36 mg/dL — ABNORMAL HIGH (ref 8–23)
CO2: 32 mmol/L (ref 22–32)
Calcium: 10.7 mg/dL — ABNORMAL HIGH (ref 8.9–10.3)
Chloride: 100 mmol/L (ref 98–111)
Creatinine: 1.8 mg/dL — ABNORMAL HIGH (ref 0.61–1.24)
GFR, Estimated: 37 mL/min — ABNORMAL LOW (ref >60)
Glucose, Bld: 213 mg/dL — ABNORMAL HIGH (ref 70–99)
Potassium: 4.4 mmol/L (ref 3.5–5.1)
Sodium: 141 mmol/L (ref 135–145)
Total Bilirubin: 0.6 mg/dL (ref 0.3–1.2)
Total Protein: 7.6 g/dL (ref 6.5–8.1)

## 2022-04-17 LAB — VITAMIN B12: Vitamin B-12: 218 pg/mL (ref 180–914)

## 2022-04-17 LAB — FERRITIN: Ferritin: 1409 ng/mL — ABNORMAL HIGH (ref 24–336)

## 2022-04-17 LAB — LACTATE DEHYDROGENASE: LDH: 143 U/L (ref 98–192)

## 2022-04-17 MED ORDER — DENOSUMAB 120 MG/1.7ML ~~LOC~~ SOLN
120.0000 mg | Freq: Once | SUBCUTANEOUS | Status: AC
  Administered 2022-04-17: 120 mg via SUBCUTANEOUS
  Filled 2022-04-17: qty 1.7

## 2022-04-17 MED ORDER — CYANOCOBALAMIN 1000 MCG/ML IJ SOLN
1000.0000 ug | Freq: Once | INTRAMUSCULAR | Status: AC
  Administered 2022-04-17: 1000 ug via INTRAMUSCULAR
  Filled 2022-04-17: qty 1

## 2022-04-17 NOTE — Patient Instructions (Signed)
Vitamin B12 Deficiency Vitamin B12 deficiency occurs when the body does not have enough of this important vitamin. The body needs this vitamin: To make red blood cells. To make DNA. This is the genetic material inside cells. To help the nerves work properly so they can carry messages from the brain to the body. Vitamin B12 deficiency can cause health problems, such as not having enough red blood cells in the blood (anemia). This can lead to nerve damage if untreated. What are the causes? This condition may be caused by: Not eating enough foods that contain vitamin B12. Not having enough stomach acid and digestive fluids to properly absorb vitamin B12 from the food that you eat. Having certain diseases that make it hard to absorb vitamin B12. These diseases include Crohn's disease, chronic pancreatitis, and cystic fibrosis. An autoimmune disorder in which the body does not make enough of a protein (intrinsic factor) within the stomach, resulting in not enough absorption of vitamin B12. Having a surgery in which part of the stomach or small intestine is removed. Taking certain medicines that make it hard for the body to absorb vitamin B12. These include: Heartburn medicines, such as antacids and proton pump inhibitors. Some medicines that are used to treat diabetes. What increases the risk? The following factors may make you more likely to develop a vitamin B12 deficiency: Being an older adult. Eating a vegetarian or vegan diet that does not include any foods that come from animals. Eating a poor diet while you are pregnant. Taking certain medicines. Having alcoholism. What are the signs or symptoms? In some cases, there are no symptoms of this condition. If the condition leads to anemia or nerve damage, various symptoms may occur, such as: Weakness. Tiredness (fatigue). Loss of appetite. Numbness or tingling in your hands and feet. Redness and burning of the tongue. Depression,  confusion, or memory problems. Trouble walking. If anemia is severe, symptoms can include: Shortness of breath. Dizziness. Rapid heart rate. How is this diagnosed? This condition may be diagnosed with a blood test to measure the level of vitamin B12 in your blood. You may also have other tests, including: A group of tests that measure certain characteristics of blood cells (complete blood count, CBC). A blood test to measure intrinsic factor. A procedure where a thin tube with a camera on the end is used to look into your stomach or intestines (endoscopy). Other tests may be needed to discover the cause of the deficiency. How is this treated? Treatment for this condition depends on the cause. This condition may be treated by: Changing your eating and drinking habits, such as: Eating more foods that contain vitamin B12. Drinking less alcohol or no alcohol. Getting vitamin B12 injections. Taking vitamin B12 supplements by mouth (orally). Your health care provider will tell you which dose is best for you. Follow these instructions at home: Eating and drinking  Include foods in your diet that come from animals and contain a lot of vitamin B12. These include: Meats and poultry. This includes beef, pork, chicken, turkey, and organ meats, such as liver. Seafood. This includes clams, rainbow trout, salmon, tuna, and haddock. Eggs. Dairy foods such as milk, yogurt, and cheese. Eat foods that have vitamin B12 added to them (are fortified), such as ready-to-eat breakfast cereals. Check the label on the package to see if a food is fortified. The items listed above may not be a complete list of foods and beverages you can eat and drink. Contact a dietitian for   more information. Alcohol use Do not drink alcohol if: Your health care provider tells you not to drink. You are pregnant, may be pregnant, or are planning to become pregnant. If you drink alcohol: Limit how much you have to: 0-1 drink a  day for women. 0-2 drinks a day for men. Know how much alcohol is in your drink. In the [place] one drink equals one 12 oz bottle of beer (355 mL), one 5 oz glass of wine (148 mL), or one 1 oz glass of hard liquor (44 mL). General instructions Get vitamin B12 injections if told to by your health care provider. Take supplements only as told by your health care provider. Follow the directions carefully. Keep all follow-up visits. This is important. Contact a health care provider if: Your symptoms come back. Your symptoms get worse or do not improve with treatment. Get help right away: You develop shortness of breath. You have a rapid heart rate. You have chest pain. You become dizzy or you faint. These symptoms may be an emergency. Get help right away. Call 911. Do not wait to see if the symptoms will go away. Do not drive yourself to the hospital. Summary Vitamin B12 deficiency occurs when the body does not have enough of this important vitamin. Common causes include not eating enough foods that contain vitamin B12, not being able to absorb vitamin B12 from the food that you eat, having a surgery in which part of the stomach or small intestine is removed, or taking certain medicines. Eat foods that have vitamin B12 in them. Treatment may include making a change in the way you eat and drink, getting vitamin B12 injections, or taking vitamin B12 supplements. This information is not intended to replace advice given to you by your health care provider. Make sure you discuss any questions you have with your health care provider. Document Revised: 02/09/2021 Document Reviewed: 02/09/2021 Elsevier Patient Education  [place]. Denosumab Injection (Oncology) What is this medication? DENOSUMAB (den oh SUE mab) prevents weakened bones caused by cancer. It may also be used to treat noncancerous bone tumors that cannot be removed by surgery. It can also be used to treat high calcium levels in  the blood caused by cancer. It works by blocking a protein that causes bones to break down quickly. This slows down the release of calcium from bones, which lowers calcium levels in your blood. It also makes your bones stronger and less likely to break (fracture). This medicine may be used for other purposes; ask your health care provider or pharmacist if you have questions. COMMON BRAND NAME(S): XGEVA What should I tell my care team before I take this medication? They need to know if you have any of these conditions: Dental disease Having surgery or tooth extraction Infection Kidney disease Low levels of calcium or vitamin D in the blood Malnutrition On hemodialysis Skin conditions or sensitivity Thyroid or parathyroid disease An unusual reaction to denosumab, other medications, foods, dyes, or preservatives Pregnant or trying to get pregnant Breast-feeding How should I use this medication? This medication is for injection under the skin. It is given by your care team in a hospital or clinic setting. A special MedGuide will be given to you before each treatment. Be sure to read this information carefully each time. Talk to your care team about the use of this medication in children. While it may be prescribed for children as young as 13 years for selected conditions, precautions do apply. Overdosage: If  you think you have taken too much of this medicine contact a poison control center or emergency room at once. NOTE: This medicine is only for you. Do not share this medicine with others. What if I miss a dose? Keep appointments for follow-up doses. It is important not to miss your dose. Call your care team if you are unable to keep an appointment. What may interact with this medication? Do not take this medication with any of the following: Other medications containing denosumab This medication may also interact with the following: Medications that lower your chance of fighting  infection Steroid medications, such as prednisone or cortisone This list may not describe all possible interactions. Give your health care provider a list of all the medicines, herbs, non-prescription drugs, or dietary supplements you use. Also tell them if you smoke, drink alcohol, or use illegal drugs. Some items may interact with your medicine. What should I watch for while using this medication? Your condition will be monitored carefully while you are receiving this medication. You may need blood work while taking this medication. This medication may increase your risk of getting an infection. Call your care team for advice if you get a fever, chills, sore throat, or other symptoms of a cold or flu. Do not treat yourself. Try to avoid being around people who are sick. You should make sure you get enough calcium and vitamin D while you are taking this medication, unless your care team tells you not to. Discuss the foods you eat and the vitamins you take with your care team. Some people who take this medication have severe bone, joint, or muscle pain. This medication may also increase your risk for jaw problems or a broken thigh bone. Tell your care team right away if you have severe pain in your jaw, bones, joints, or muscles. Tell your care team if you have any pain that does not go away or that gets worse. Talk to your care team if you may be pregnant. Serious birth defects can occur if you take this medication during pregnancy and for 5 months after the last dose. You will need a negative pregnancy test before starting this medication. Contraception is recommended while taking this medication and for 5 months after the last dose. Your care team can help you find the option that works for you. What side effects may I notice from receiving this medication? Side effects that you should report to your care team as soon as possible: Allergic reactions--skin rash, itching, hives, swelling of the face,  lips, tongue, or throat Bone, joint, or muscle pain Low calcium level--muscle pain or cramps, confusion, tingling, or numbness in the hands or feet Osteonecrosis of the jaw--pain, swelling, or redness in the mouth, numbness of the jaw, poor healing after dental work, unusual discharge from the mouth, visible bones in the mouth Side effects that usually do not require medical attention (report to your care team if they continue or are bothersome): Cough Diarrhea Fatigue Headache Nausea This list may not describe all possible side effects. Call your doctor for medical advice about side effects. You may report side effects to FDA at 1-800-FDA-1088. Where should I keep my medication? This medication is given in a hospital or clinic. It will not be stored at home. NOTE: This sheet is a summary. It may not cover all possible information. If you have questions about this medicine, talk to your doctor, pharmacist, or health care provider.  2023 Elsevier/Gold Standard (2021-11-05 00:00:00)

## 2022-04-17 NOTE — Progress Notes (Signed)
Hematology and Oncology Follow Up Visit  Richard [name] [id number] 1941-01-17 81 y.o. 04/17/2022   Principle Diagnosis:  Diffuse large cell non-Hodgkin's lymphoma (IPI = 3) - NOT "double hit" Pernicious anemia Iron deficiency secondary to bleeding   Past Therapy: R-CHOP - s/p cycle 8 - completed 08/2017   Current Therapy:        Vitamin B12 1 mg IM every month Xgeva 120 mg subcu q 3 months - next dose due in 03/2022 IV iron as indicated    Interim History:  Mr. Iodice is here today for follow-up and B 12 injection. He is doing quite well. His energy is improved and he states that his golf game has never been better.  He did take a tumble out of the golf cart last week and has a scabbed abrasion on the right knee. Thankfully he states this is healing nicely. No redness, edema or drainage.  No other falls, no syncope.  No swelling in his extremities.  Neuropathy in his feet unchanged from baseline.  No fever, chills, n/v, cough, rash, dizziness, SOB, chest pain, palpitations, abdominal pain or changes in bowel or bladder habits.  Appetite and hydration are good. Weight is stable at 234 lbs.   ECOG Performance Status: 1 - Symptomatic but completely ambulatory  Medications:  Allergies as of 04/17/2022       Reactions   Ace Inhibitors Swelling, Other (See Comments)   Angioedema   Benazepril Swelling, Other (See Comments)   Angioedema; he is not a candidate for any angiotensin receptor blockers because of this significant allergic reaction. Because of a history of documented adverse serious drug reaction;Medi Alert bracelet  is recommended   Entresto [sacubitril-valsartan] Swelling, Other (See Comments)   First-in-Class Angiotensin Receptor Neprilysin Inhibitor- Med was "red-flagged" by the patient's pharmacy for him to NOT take!   Hctz [hydrochlorothiazide] Anaphylaxis, Swelling   Tongue and lip swelling   Aspirin Other (See Comments)   Gastritis, can aspirin not take 325 mg  aspirin        Medication List        Accurate as of April 17, 2022  1:15 PM. If you have any questions, ask your nurse or doctor.          acetaminophen 325 MG tablet Commonly known as: TYLENOL Take 650 mg by mouth at bedtime as needed for moderate pain or headache.   allopurinol 300 MG tablet Commonly known as: ZYLOPRIM Take 450 mg by mouth daily.   aluminum hydroxide-magnesium carbonate 95-358 MG/15ML Susp Commonly known as: GAVISCON Take 15 mLs by mouth as needed for indigestion or heartburn.   apixaban 2.5 MG Tabs tablet Commonly known as: ELIQUIS Take 1 tablet (2.5 mg total) by mouth 2 (two) times daily.   atorvastatin 80 MG tablet Commonly known as: LIPITOR TAKE 1 TABLET BY MOUTH EVERYDAY AT BEDTIME What changed: See the new instructions.   azelastine 0.1 % nasal spray Commonly known as: ASTELIN PLACE 2 SPRAYS INTO BOTH NOSTRILS AT BEDTIME AS NEEDED FOR RHINITIS OR ALLERGIES.   CO Q-10 PO Take 1 capsule by mouth daily.   esomeprazole 40 MG capsule Commonly known as: NexIUM Take 1 capsule (40 mg total) by mouth daily. What changed: when to take this   ezetimibe 10 MG tablet Commonly known as: ZETIA TAKE 1 TABLET BY MOUTH EVERY DAY   Farxiga 10 MG Tabs tablet Generic drug: dapagliflozin propanediol Take 10 mg by mouth in the morning.   fenofibrate 160 MG tablet TAKE 1  TABLET BY MOUTH EVERY DAY   folic acid 1 MG tablet Commonly known as: FOLVITE TAKE 2 TABLETS BY MOUTH EVERY DAY What changed: when to take this   freestyle lancets USE ONCE A DAY TO CHECK BLOOD SUGAR.   FREESTYLE LITE test strip Generic drug: glucose blood USE TO TEST BLOOD SUGAR ONCE A DAY. DX CODE: E11.9   glimepiride 2 MG tablet Commonly known as: AMARYL Take 1 tablet (2 mg total) by mouth daily with breakfast.   icosapent Ethyl 1 g capsule Commonly known as: VASCEPA TAKE 2 CAPSULES BY MOUTH TWICE A DAY What changed: when to take this   ICY HOT ADVANCED PAIN  RELIEF EX Apply 1 application  topically daily as needed (to painful sites).   metoprolol succinate 25 MG 24 hr tablet Commonly known as: TOPROL-XL Take 1 tablet (25 mg total) by mouth daily.   nitroGLYCERIN 0.4 MG SL tablet Commonly known as: NITROSTAT PLACE 1 TABLET (0.4 MG TOTAL) UNDER THE TONGUE EVERY 5 (FIVE) MINUTES AS NEEDED FOR CHEST PAIN.   potassium chloride SA 20 MEQ tablet Commonly known as: Klor-Con M20 Take 2 tablets (40 mEq total) by mouth daily.   pregabalin 150 MG capsule Commonly known as: LYRICA TAKE 1 CAPSULE BY MOUTH TWICE A DAY   psyllium 58.6 % powder Commonly known as: METAMUCIL Take 1 packet by mouth daily as needed (for constipation- mix and drink).   senna 8.6 MG Tabs tablet Commonly known as: SENOKOT Take 2 tablets (17.2 mg total) by mouth at bedtime. This is for constipation.  Stop taking if you start having diarrhea.   torsemide 20 MG tablet Commonly known as: DEMADEX Take 2 tablets (40 mg total) by mouth in the morning. Do not take on 01/24/2022.   VITAMIN C PO Take 1 tablet by mouth daily with breakfast.        Allergies:  Allergies  Allergen Reactions   Ace Inhibitors Swelling and Other (See Comments)    Angioedema    Benazepril Swelling and Other (See Comments)    Angioedema; he is not a candidate for any angiotensin receptor blockers because of this significant allergic reaction. Because of a history of documented adverse serious drug reaction;Medi Alert bracelet  is recommended   Entresto [Sacubitril-Valsartan] Swelling and Other (See Comments)    First-in-Class Angiotensin Receptor Neprilysin Inhibitor- Med was "red-flagged" by the patient's pharmacy for him to NOT take!   Hctz [Hydrochlorothiazide] Anaphylaxis and Swelling    Tongue and lip swelling    Aspirin Other (See Comments)    Gastritis, can aspirin not take 325 mg aspirin    Past Medical History, Surgical history, Social history, and Family History were reviewed and  updated.  Review of Systems: All other 10 point review of systems is negative.   Physical Exam:  vitals were not taken for this visit.   Wt Readings from Last 3 Encounters:  04/08/22 237 lb (107.5 kg)  03/18/22 231 lb 6.4 oz (105 kg)  02/08/22 232 lb 3.2 oz (105.3 kg)    Ocular: Sclerae unicteric, pupils equal, round and reactive to light Ear-nose-throat: Oropharynx clear, dentition fair Lymphatic: No cervical or supraclavicular adenopathy Lungs no rales or rhonchi, good excursion bilaterally Heart regular rate and rhythm, no murmur appreciated Abd soft, nontender, positive bowel sounds MSK no focal spinal tenderness, no joint edema Neuro: non-focal, well-oriented, appropriate affect Breasts: Deferred   Lab Results  Component Value Date   WBC 8.8 04/17/2022   HGB 14.1 04/17/2022   HCT  44.0 04/17/2022   MCV 92.1 04/17/2022   PLT 192 04/17/2022   Lab Results  Component Value Date   FERRITIN 1,715 (H) 03/18/2022   IRON 99 03/18/2022   TIBC 392 03/18/2022   UIBC 293 03/18/2022   IRONPCTSAT 25 03/18/2022   Lab Results  Component Value Date   RETICCTPCT 1.6 04/17/2022   RBC 4.81 04/17/2022   RETICCTABS 52.1 11/22/2011   No results found for: "KPAFRELGTCHN", "LAMBDASER", "[facility]" Lab Results  Component Value Date   IGA 159 03/09/2012   Lab Results  Component Value Date   ALBUMINELP 4.2 07/27/2018   MSPIKE Not Observed 07/27/2018     Chemistry      Component Value Date/Time   NA 138 03/18/2022 1305   NA 138 02/08/2022 0811   NA 144 06/27/2017 0857   K 4.0 03/18/2022 1305   K 3.9 06/27/2017 0857   CL 99 03/18/2022 1305   CL 103 06/27/2017 0857   CO2 28 03/18/2022 1305   CO2 26 06/27/2017 0857   BUN 47 (H) 03/18/2022 1305   BUN 46 (H) 02/08/2022 0811   BUN 12 06/27/2017 0857   CREATININE 1.87 (H) 03/18/2022 1305   CREATININE 1.65 (H) 04/18/2020 0956      Component Value Date/Time   CALCIUM 10.4 (H) 03/18/2022 1305   CALCIUM 9.2 06/27/2017 0857    ALKPHOS 69 03/18/2022 1305   ALKPHOS 128 (H) 06/27/2017 0857   AST 22 03/18/2022 1305   ALT 29 03/18/2022 1305   ALT 24 06/27/2017 0857   BILITOT 0.6 03/18/2022 1305       Impression and Plan: Mr. Rehm is a very pleasant 81 yo caucasian gentleman with diffuse large B-cell lymphoma (not "double hit" lymphoma). He completed treatment in February 2019.  Iron studies pending.  B 12 given.  Follow-up in 1 month.   Lottie Dawson, NP 10/18/20231:15 PM

## 2022-04-18 LAB — IRON AND IRON BINDING CAPACITY (CC-WL,HP ONLY)
Iron: 83 ug/dL (ref 45–182)
Saturation Ratios: 22 % (ref 17.9–39.5)
TIBC: 377 ug/dL (ref 250–450)
UIBC: 294 ug/dL (ref 117–376)

## 2022-04-24 ENCOUNTER — Emergency Department (HOSPITAL_COMMUNITY): Payer: Medicare Other

## 2022-04-24 ENCOUNTER — Telehealth: Payer: Self-pay

## 2022-04-24 ENCOUNTER — Encounter (HOSPITAL_COMMUNITY): Payer: Self-pay

## 2022-04-24 ENCOUNTER — Other Ambulatory Visit: Payer: Self-pay

## 2022-04-24 ENCOUNTER — Inpatient Hospital Stay (HOSPITAL_COMMUNITY)
Admission: EM | Admit: 2022-04-24 | Discharge: 2022-04-27 | DRG: 603 | Disposition: A | Discharge status: Home / Self Care | Payer: Medicare Other | Attending: Internal Medicine | Admitting: Internal Medicine

## 2022-04-24 DIAGNOSIS — G4733 Obstructive sleep apnea (adult) (pediatric): Secondary | ICD-10-CM | POA: Diagnosis present

## 2022-04-24 DIAGNOSIS — N1832 Chronic kidney disease, stage 3b: Secondary | ICD-10-CM | POA: Diagnosis not present

## 2022-04-24 DIAGNOSIS — I129 Hypertensive chronic kidney disease with stage 1 through stage 4 chronic kidney disease, or unspecified chronic kidney disease: Secondary | ICD-10-CM | POA: Diagnosis not present

## 2022-04-24 DIAGNOSIS — E1169 Type 2 diabetes mellitus with other specified complication: Secondary | ICD-10-CM | POA: Diagnosis present

## 2022-04-24 DIAGNOSIS — Z79899 Other long term (current) drug therapy: Secondary | ICD-10-CM

## 2022-04-24 DIAGNOSIS — Z8572 Personal history of non-Hodgkin lymphomas: Secondary | ICD-10-CM

## 2022-04-24 DIAGNOSIS — I442 Atrioventricular block, complete: Secondary | ICD-10-CM | POA: Diagnosis present

## 2022-04-24 DIAGNOSIS — Z888 Allergy status to other drugs, medicaments and biological substances status: Secondary | ICD-10-CM

## 2022-04-24 DIAGNOSIS — Z6841 Body Mass Index (BMI) 40.0 and over, adult: Secondary | ICD-10-CM | POA: Diagnosis not present

## 2022-04-24 DIAGNOSIS — Z950 Presence of cardiac pacemaker: Secondary | ICD-10-CM | POA: Diagnosis not present

## 2022-04-24 DIAGNOSIS — L03115 Cellulitis of right lower limb: Secondary | ICD-10-CM | POA: Diagnosis present

## 2022-04-24 DIAGNOSIS — I483 Typical atrial flutter: Secondary | ICD-10-CM | POA: Diagnosis not present

## 2022-04-24 DIAGNOSIS — G8929 Other chronic pain: Secondary | ICD-10-CM | POA: Diagnosis present

## 2022-04-24 DIAGNOSIS — Z7901 Long term (current) use of anticoagulants: Secondary | ICD-10-CM

## 2022-04-24 DIAGNOSIS — E1142 Type 2 diabetes mellitus with diabetic polyneuropathy: Secondary | ICD-10-CM | POA: Diagnosis not present

## 2022-04-24 DIAGNOSIS — Z952 Presence of prosthetic heart valve: Secondary | ICD-10-CM

## 2022-04-24 DIAGNOSIS — E1122 Type 2 diabetes mellitus with diabetic chronic kidney disease: Secondary | ICD-10-CM | POA: Diagnosis present

## 2022-04-24 DIAGNOSIS — I350 Nonrheumatic aortic (valve) stenosis: Secondary | ICD-10-CM | POA: Diagnosis present

## 2022-04-24 DIAGNOSIS — I130 Hypertensive heart and chronic kidney disease with heart failure and stage 1 through stage 4 chronic kidney disease, or unspecified chronic kidney disease: Secondary | ICD-10-CM | POA: Diagnosis present

## 2022-04-24 DIAGNOSIS — E782 Mixed hyperlipidemia: Secondary | ICD-10-CM | POA: Diagnosis present

## 2022-04-24 DIAGNOSIS — L0390 Cellulitis, unspecified: Secondary | ICD-10-CM | POA: Diagnosis not present

## 2022-04-24 DIAGNOSIS — M160 Bilateral primary osteoarthritis of hip: Secondary | ICD-10-CM | POA: Diagnosis present

## 2022-04-24 DIAGNOSIS — N184 Chronic kidney disease, stage 4 (severe): Secondary | ICD-10-CM

## 2022-04-24 DIAGNOSIS — I4892 Unspecified atrial flutter: Secondary | ICD-10-CM | POA: Diagnosis not present

## 2022-04-24 DIAGNOSIS — M545 Low back pain, unspecified: Secondary | ICD-10-CM | POA: Diagnosis present

## 2022-04-24 DIAGNOSIS — M109 Gout, unspecified: Secondary | ICD-10-CM | POA: Diagnosis present

## 2022-04-24 DIAGNOSIS — Z886 Allergy status to analgesic agent status: Secondary | ICD-10-CM

## 2022-04-24 DIAGNOSIS — S80211A Abrasion, right knee, initial encounter: Secondary | ICD-10-CM | POA: Diagnosis present

## 2022-04-24 DIAGNOSIS — M7989 Other specified soft tissue disorders: Secondary | ICD-10-CM | POA: Diagnosis not present

## 2022-04-24 DIAGNOSIS — Z85828 Personal history of other malignant neoplasm of skin: Secondary | ICD-10-CM

## 2022-04-24 DIAGNOSIS — R509 Fever, unspecified: Secondary | ICD-10-CM | POA: Diagnosis not present

## 2022-04-24 DIAGNOSIS — E119 Type 2 diabetes mellitus without complications: Secondary | ICD-10-CM

## 2022-04-24 DIAGNOSIS — R7989 Other specified abnormal findings of blood chemistry: Secondary | ICD-10-CM

## 2022-04-24 DIAGNOSIS — I4891 Unspecified atrial fibrillation: Secondary | ICD-10-CM | POA: Diagnosis present

## 2022-04-24 DIAGNOSIS — I2510 Atherosclerotic heart disease of native coronary artery without angina pectoris: Secondary | ICD-10-CM | POA: Diagnosis not present

## 2022-04-24 DIAGNOSIS — N189 Chronic kidney disease, unspecified: Secondary | ICD-10-CM | POA: Diagnosis not present

## 2022-04-24 DIAGNOSIS — Z833 Family history of diabetes mellitus: Secondary | ICD-10-CM

## 2022-04-24 DIAGNOSIS — I5042 Chronic combined systolic (congestive) and diastolic (congestive) heart failure: Secondary | ICD-10-CM | POA: Diagnosis present

## 2022-04-24 DIAGNOSIS — Z7984 Long term (current) use of oral hypoglycemic drugs: Secondary | ICD-10-CM

## 2022-04-24 DIAGNOSIS — Z8249 Family history of ischemic heart disease and other diseases of the circulatory system: Secondary | ICD-10-CM

## 2022-04-24 DIAGNOSIS — M4800 Spinal stenosis, site unspecified: Secondary | ICD-10-CM | POA: Diagnosis present

## 2022-04-24 DIAGNOSIS — Z951 Presence of aortocoronary bypass graft: Secondary | ICD-10-CM

## 2022-04-24 DIAGNOSIS — Z823 Family history of stroke: Secondary | ICD-10-CM

## 2022-04-24 DIAGNOSIS — E872 Acidosis, unspecified: Secondary | ICD-10-CM

## 2022-04-24 DIAGNOSIS — E785 Hyperlipidemia, unspecified: Secondary | ICD-10-CM | POA: Diagnosis present

## 2022-04-24 DIAGNOSIS — Z800 Family history of malignant neoplasm of digestive organs: Secondary | ICD-10-CM

## 2022-04-24 DIAGNOSIS — L538 Other specified erythematous conditions: Secondary | ICD-10-CM | POA: Diagnosis not present

## 2022-04-24 DIAGNOSIS — I10 Essential (primary) hypertension: Secondary | ICD-10-CM | POA: Diagnosis present

## 2022-04-24 DIAGNOSIS — K219 Gastro-esophageal reflux disease without esophagitis: Secondary | ICD-10-CM | POA: Diagnosis present

## 2022-04-24 DIAGNOSIS — S8011XA Contusion of right lower leg, initial encounter: Secondary | ICD-10-CM | POA: Diagnosis present

## 2022-04-24 LAB — COMPREHENSIVE METABOLIC PANEL
ALT: 23 U/L (ref 0–44)
AST: 23 U/L (ref 15–41)
Albumin: 4.2 g/dL (ref 3.5–5.0)
Alkaline Phosphatase: 61 U/L (ref 38–126)
Anion gap: 11 (ref 5–15)
BUN: 43 mg/dL — ABNORMAL HIGH (ref 8–23)
CO2: 28 mmol/L (ref 22–32)
Calcium: 9.8 mg/dL (ref 8.9–10.3)
Chloride: 98 mmol/L (ref 98–111)
Creatinine, Ser: 2 mg/dL — ABNORMAL HIGH (ref 0.61–1.24)
GFR, Estimated: 33 mL/min — ABNORMAL LOW (ref >60)
Glucose, Bld: 282 mg/dL — ABNORMAL HIGH (ref 70–99)
Potassium: 4.1 mmol/L (ref 3.5–5.1)
Sodium: 137 mmol/L (ref 135–145)
Total Bilirubin: 0.9 mg/dL (ref 0.3–1.2)
Total Protein: 7.7 g/dL (ref 6.5–8.1)

## 2022-04-24 LAB — LACTIC ACID, PLASMA
Lactic Acid, Venous: 2.4 mmol/L (ref 0.5–1.9)
Lactic Acid, Venous: 2 mmol/L (ref 0.5–1.9)

## 2022-04-24 LAB — CBC
HCT: 44.6 % (ref 39.0–52.0)
Hemoglobin: 13.9 g/dL (ref 13.0–17.0)
MCH: 29.1 pg (ref 26.0–34.0)
MCHC: 31.2 g/dL (ref 30.0–36.0)
MCV: 93.5 fL (ref 80.0–100.0)
Platelets: 202 10*3/uL (ref 150–400)
RBC: 4.77 MIL/uL (ref 4.22–5.81)
RDW: 17.2 % — ABNORMAL HIGH (ref 11.5–15.5)
WBC: 9.4 10*3/uL (ref 4.0–10.5)
nRBC: 0 % (ref 0.0–0.2)

## 2022-04-24 MED ORDER — LACTATED RINGERS IV BOLUS
1000.0000 mL | Freq: Once | INTRAVENOUS | Status: AC
  Administered 2022-04-24: 1000 mL via INTRAVENOUS

## 2022-04-24 MED ORDER — VANCOMYCIN HCL 2000 MG/400ML IV SOLN
2000.0000 mg | Freq: Once | INTRAVENOUS | Status: DC
  Filled 2022-04-24: qty 400

## 2022-04-24 MED ORDER — ONDANSETRON HCL 4 MG/2ML IJ SOLN
4.0000 mg | Freq: Once | INTRAMUSCULAR | Status: AC
  Administered 2022-04-24: 4 mg via INTRAVENOUS
  Filled 2022-04-24: qty 2

## 2022-04-24 MED ORDER — VANCOMYCIN HCL 500 MG/100ML IV SOLN
500.0000 mg | Freq: Once | INTRAVENOUS | Status: DC
  Filled 2022-04-24: qty 100

## 2022-04-24 MED ORDER — CEFAZOLIN SODIUM-DEXTROSE 1-4 GM/50ML-% IV SOLN
1.0000 g | Freq: Once | INTRAVENOUS | Status: AC
  Administered 2022-04-24: 1 g via INTRAVENOUS
  Filled 2022-04-24: qty 50

## 2022-04-24 MED ORDER — VANCOMYCIN HCL 2000 MG/400ML IV SOLN
2000.0000 mg | Freq: Once | INTRAVENOUS | Status: AC
  Administered 2022-04-24: 2000 mg via INTRAVENOUS
  Filled 2022-04-24: qty 400

## 2022-04-24 MED ORDER — HYDROMORPHONE HCL 1 MG/ML IJ SOLN
0.5000 mg | Freq: Once | INTRAMUSCULAR | Status: AC
  Administered 2022-04-24: 0.5 mg via INTRAVENOUS
  Filled 2022-04-24: qty 1

## 2022-04-24 MED ORDER — VANCOMYCIN HCL IN DEXTROSE 1-5 GM/200ML-% IV SOLN
1000.0000 mg | INTRAVENOUS | Status: DC
  Administered 2022-04-25 – 2022-04-26 (×2): 1000 mg via INTRAVENOUS
  Filled 2022-04-24 (×2): qty 200

## 2022-04-24 NOTE — ED Provider Triage Note (Signed)
Emergency Medicine Provider Triage Evaluation Note  Richard [name] , a 81 y.o. male  was evaluated in triage.  Pt complains of right leg pain. He reports that he [place] 10 [place] and skinned his right knee. He reports that has been healing, but he has had some pain in his lower right leg, mainly in the calf. Denies any chest pain or SOB.   Review of Systems  Positive:  Negative:  Physical Exam  BP 128/67   Pulse 70   Temp 98.8 F (37.1 C) (Oral)   Resp 18   Ht 6' (1.829 m)   Wt (!) 151 kg   SpO2 94%   BMI 45.16 kg/m  Gen:   Awake, no distress   Resp:  Normal effort  MSK:   Moves extremities without difficulty  Other:  Dopplerable DP and PT pulses. Compartments are soft. Some mild swelling and erythema to the leg. Dry scally skin. Scabbing noted to the knee.   Medical Decision Making  Medically screening exam initiated at 10:24 AM.  Appropriate orders placed.  Ivis Henneman Arabie was informed that the remainder of the evaluation will be completed by another provider, this initial triage assessment does not replace that evaluation, and the importance of remaining in the ED until their evaluation is complete.  Orders placed by attending who assessed at bedside.    Sherrell Puller, PA-C 04/24/22 1040

## 2022-04-24 NOTE — ED Provider Notes (Signed)
[facility] DEPT Provider Note   CSN: [id number] Arrival date & time: 04/24/22  7353     History  Chief Complaint  Patient presents with   Leg Pain    Richard [name] is a 81 y.o. male.  Patient with hx aflutter, dm, presents with reports of fall from golf cart ~ 8 days ago, with abrasion to right knee, contusion to lower leg. States was mechanical fall getting out of cart. No faintness or dizziness. Denies head injury or head contusion then. No loc. No headache since. C/o abrasion to right knee anteriorly then and current scab to area, no pus draining from wound. In past few days, has noted redness, swelling, warmth to skin of lower leg with fever to 101. Pt notes pain to r lowerleg worse w wt bearing. No recent antibiotic use. Is on eliquis for afib, denies recent abnormal bruising or bleeding. No foot/leg numbness/weakness.  Denies other source of fever/symptoms. No cough or uri symptoms. No abd pain or nvd. No dysuria or gu c/o.  Had tetanus imm a few yrs ago, due for repeat 2029.   The history is provided by the patient and medical records.  Leg Pain Associated symptoms: fever   Associated symptoms: no back pain and no neck pain        Home Medications Prior to Admission medications   Medication Sig Start Date End Date Taking? Authorizing Provider  acetaminophen (TYLENOL) 325 MG tablet Take 650 mg by mouth at bedtime as needed for moderate pain or headache.    [provider]  allopurinol (ZYLOPRIM) 300 MG tablet Take 450 mg by mouth daily.    [provider]  aluminum hydroxide-magnesium carbonate (GAVISCON) 95-358 MG/15ML SUSP Take 15 mLs by mouth as needed for indigestion or heartburn.    [provider]  apixaban (ELIQUIS) 2.5 MG TABS tablet Take 1 tablet (2.5 mg total) by mouth 2 (two) times daily. 03/20/22   Minus Breeding, MD  Ascorbic Acid (VITAMIN C PO) Take 1 tablet by mouth daily with breakfast.    [provider]  atorvastatin (LIPITOR) 80 MG tablet TAKE 1 TABLET BY MOUTH EVERYDAY AT BEDTIME Patient taking differently: Take 80 mg by mouth at bedtime. 12/21/21   Minus Breeding, MD  azelastine (ASTELIN) 0.1 % nasal spray PLACE 2 SPRAYS INTO BOTH NOSTRILS AT BEDTIME AS NEEDED FOR RHINITIS OR ALLERGIES. 08/03/21   Carollee Herter, Alferd Apa, DO  Coenzyme Q10 (CO Q-10 PO) Take 1 capsule by mouth daily.    [provider]  esomeprazole (NEXIUM) 40 MG capsule Take 1 capsule (40 mg total) by mouth daily. Patient taking differently: Take 40 mg by mouth daily before breakfast. 10/15/12   [name], MD  ezetimibe (ZETIA) 10 MG tablet TAKE 1 TABLET BY MOUTH EVERY DAY 12/27/21   Minus Breeding, MD  FARXIGA 10 MG TABS tablet Take 10 mg by mouth in the morning.    [provider]  fenofibrate 160 MG tablet TAKE 1 TABLET BY MOUTH EVERY DAY 02/08/22   Carollee Herter, Alferd Apa, DO  folic acid (FOLVITE) 1 MG tablet TAKE 2 TABLETS BY MOUTH EVERY DAY Patient taking differently: Take 2 mg by mouth in the morning. 06/21/21   [name], MD  FREESTYLE LITE test strip USE TO TEST BLOOD SUGAR ONCE A DAY. DX CODE: E11.9 06/21/21   [name], DO  glimepiride (AMARYL) 2 MG tablet Take 1 tablet (2 mg total) by mouth daily  with breakfast. 02/26/22   Carollee Herter, Alferd Apa, DO  icosapent Ethyl (VASCEPA) 1 g capsule TAKE 2 CAPSULES BY MOUTH TWICE A DAY Patient taking differently: Take 2 g by mouth in the morning and at bedtime. 11/05/21   Roma Schanz R, DO  Lancets (FREESTYLE) lancets USE ONCE A DAY TO CHECK BLOOD SUGAR. 10/09/21   Carollee Herter, Alferd Apa, DO  Menthol-Camphor (ICY HOT ADVANCED PAIN RELIEF EX) Apply 1 application  topically daily as needed (to painful sites).    [provider]  metoprolol succinate (TOPROL-XL) 25 MG 24 hr tablet Take 1 tablet (25 mg total) by mouth daily. 02/08/22   Minus Breeding, MD  nitroGLYCERIN (NITROSTAT) 0.4 MG SL tablet PLACE 1 TABLET (0.4  MG TOTAL) UNDER THE TONGUE EVERY 5 (FIVE) MINUTES AS NEEDED FOR CHEST PAIN. 06/27/21   Minus Breeding, MD  potassium chloride SA (KLOR-CON M20) 20 MEQ tablet Take 2 tablets (40 mEq total) by mouth daily. 01/25/22   Hongalgi, Lenis Dickinson, MD  pregabalin (LYRICA) 150 MG capsule TAKE 1 CAPSULE BY MOUTH TWICE A DAY 02/25/22   Carollee Herter, Yvonne R, DO  psyllium (METAMUCIL) 58.6 % powder Take 1 packet by mouth daily as needed (for constipation- mix and drink).    [provider]  senna (SENOKOT) 8.6 MG TABS tablet Take 2 tablets (17.2 mg total) by mouth at bedtime. This is for constipation.  Stop taking if you start having diarrhea. 01/23/22   Hongalgi, Lenis Dickinson, MD  torsemide (DEMADEX) 20 MG tablet Take 2 tablets (40 mg total) by mouth in the morning. Do not take on 01/24/2022. 03/05/22   Minus Breeding, MD      Allergies    Ace inhibitors, Benazepril, Entresto [sacubitril-valsartan], Hctz [hydrochlorothiazide], and Aspirin    Review of Systems   Review of Systems  Constitutional:  Positive for diaphoresis and fever.  HENT:  Negative for sore throat.   Eyes:  Negative for redness.  Respiratory:  Negative for cough and shortness of breath.   Cardiovascular:  Negative for chest pain.  Gastrointestinal:  Negative for abdominal pain, diarrhea, nausea and vomiting.  Genitourinary:  Negative for dysuria and flank pain.  Musculoskeletal:  Negative for back pain and neck pain.  Skin:  Positive for wound.  Neurological:  Negative for weakness, numbness and headaches.  Psychiatric/Behavioral:  Negative for confusion.     Physical Exam Updated Vital Signs BP 128/67   Pulse 70   Temp 98.8 F (37.1 C) (Oral)   Resp 18   Ht 1.829 m (6')   Wt (!) 151 kg   SpO2 94%   BMI 45.16 kg/m  Physical Exam Vitals and nursing note reviewed.  Constitutional:      Appearance: Normal appearance. He is well-developed.  HENT:     Head: Atraumatic.     Nose: Nose normal.     Mouth/Throat:     Mouth: Mucous  membranes are moist.  Eyes:     General: No scleral icterus.    Conjunctiva/sclera: Conjunctivae normal.     Pupils: Pupils are equal, round, and reactive to light.  Neck:     Trachea: No tracheal deviation.     Comments: No stiffness or rigidity.  Cardiovascular:     Rate and Rhythm: Normal rate and regular rhythm.     Pulses: Normal pulses.     Heart sounds: Normal heart sounds. No murmur heard.    No friction rub. No gallop.  Pulmonary:     Effort: Pulmonary  effort is normal. No accessory muscle usage or respiratory distress.     Breath sounds: Normal breath sounds.  Chest:     Chest wall: No tenderness.  Abdominal:     General: Bowel sounds are normal. There is no distension.     Palpations: Abdomen is soft.     Tenderness: There is no abdominal tenderness.  Genitourinary:    Comments: No cva tenderness. Musculoskeletal:        General: No swelling.     Cervical back: Normal range of motion and neck supple. No rigidity.     Comments: CTLS spine, non tender, aligned, no step off. Large scab to right knee anteriorly. Knee is grossly stable. No effusion. No pain w passive rom at knee or ankle. Distal pulse palp. Skin of lower leg dry/cracked, with erythema and increased warm c/w cellulitis. No abscess/fluctuance. No crepitus. Compartments of lower leg are soft, not tense.   Skin:    General: Skin is warm and dry.     Findings: No rash.  Neurological:     Mental Status: He is alert.     Comments: Alert, speech clear. GCS 15. RLE motor/sens grossly intact.   Psychiatric:        Mood and Affect: Mood normal.     ED Results / Procedures / Treatments   Labs (all labs ordered are listed, but only abnormal results are displayed) Results for orders placed or performed during the hospital encounter of 04/24/22  CBC  Result Value Ref Range   WBC 9.4 4.0 - 10.5 K/uL   RBC 4.77 4.22 - 5.81 MIL/uL   Hemoglobin 13.9 13.0 - 17.0 g/dL   HCT 44.6 39.0 - 52.0 %   MCV 93.5 80.0 -  100.0 fL   MCH 29.1 26.0 - 34.0 pg   MCHC 31.2 30.0 - 36.0 g/dL   RDW 17.2 (H) 11.5 - 15.5 %   Platelets 202 150 - 400 K/uL   nRBC 0.0 0.0 - 0.2 %  Comprehensive metabolic panel  Result Value Ref Range   Sodium 137 135 - 145 mmol/L   Potassium 4.1 3.5 - 5.1 mmol/L   Chloride 98 98 - 111 mmol/L   CO2 28 22 - 32 mmol/L   Glucose, Bld 282 (H) 70 - 99 mg/dL   BUN 43 (H) 8 - 23 mg/dL   Creatinine, Ser 2.00 (H) 0.61 - 1.24 mg/dL   Calcium 9.8 8.9 - 10.3 mg/dL   Total Protein 7.7 6.5 - 8.1 g/dL   Albumin 4.2 3.5 - 5.0 g/dL   AST 23 15 - 41 U/L   ALT 23 0 - 44 U/L   Alkaline Phosphatase 61 38 - 126 U/L   Total Bilirubin 0.9 0.3 - 1.2 mg/dL   GFR, Estimated 33 (L) >60 mL/min   Anion gap 11 5 - 15  Lactic acid, plasma  Result Value Ref Range   Lactic Acid, Venous 2.4 (HH) 0.5 - 1.9 mmol/L   *Note: Due to a large number of results and/or encounters for the requested time period, some results have not been displayed. A complete set of results can be found in Results Review.   DG Abd 2 Views  Result Date: 04/08/2022 CLINICAL DATA:  LEFT upper quadrant discomfort, belching. Hiatal hernia?[name] EXAM: ABDOMEN - 2 VIEW COMPARISON:  Chest CT dated 10/24/2020. FINDINGS: No dilated large or small bowel loops. No evidence of soft tissue mass or abnormal fluid collection. No evidence of free intraperitoneal air. No evidence of renal  or ureteral calculi. No evidence of hiatal hernia, and no hiatal hernia was present at the time of patient's chest CT of 10/24/2020. No acute or suspicious osseous abnormality. IMPRESSION: No acute findings. Nonobstructive bowel gas pattern. No evidence of hiatal hernia, and no hiatal hernia was present at the time of patient's chest CT of 10/24/2020. Electronically Signed   By: Franki Cabot M.D.   On: 04/08/2022 17:53   DG Chest 2 View  Result Date: 04/08/2022 CLINICAL DATA:  Cough.  Rule out pneumonia. EXAM: CHEST - 2 VIEW COMPARISON:  Chest x-ray dated 01/16/2022. Chest CT  dated 10/24/2020. FINDINGS: Borderline cardiomegaly, stable. Median sternotomy wires appear intact and stable in alignment. Stable mild interstitial prominence bilaterally, lower lobe predominant. No evidence of a superimposed pneumonia. No pleural effusion or pneumothorax is seen. No acute-appearing osseous abnormality. IMPRESSION: 1. No active cardiopulmonary disease. No evidence of pneumonia or pulmonary edema. 2. Probable chronic interstitial lung disease and/or chronic bronchitic changes within the lower lobes. Electronically Signed   By: Franki Cabot M.D.   On: 04/08/2022 17:48    EKG None  Radiology DG Tibia/Fibula Right  Result Date: 04/24/2022 CLINICAL DATA:  Fall, pain EXAM: RIGHT TIBIA AND FIBULA - 2 VIEW COMPARISON:  None Available. FINDINGS: There is no evidence of acute fracture. Alignment is normal. Chronic post traumatic deformity of the tibial diaphysis. Adjacent heterotopic ossifiction at the interosseous membrane/medial fibular cortex. Mild track a osteoarthritis of the knee. There is soft tissue swelling of the lower extremity most prominent anteriorly along the tibia and prepatellar soft tissues. There is prepatellar skin irregularity. There are postsurgical changes overlying the medial soft tissues and vascular calcifications. IMPRESSION: Soft tissue swelling of the right lower extremity most prominent anteriorly along the tibia and the prepatellar soft tissues and with adjacent prepatellar skin irregularity. Chronic posttraumatic deformity of the tibial diaphysis with adjacent heterotopic ossification. No acute osseous abnormality. Electronically Signed   By: Maurine Simmering M.D.   On: 04/24/2022 12:02    Procedures Procedures    Medications Ordered in ED Medications  ceFAZolin (ANCEF) IVPB 1 g/50 mL premix (has no administration in time range)    ED Course/ Medical Decision Making/ A&P                           Medical Decision Making Problems Addressed: Abrasion, right  knee, initial encounter: acute illness or injury Acute febrile illness: acute illness or injury with systemic symptoms that poses a threat to life or bodily functions Cellulitis of right lower leg: acute illness or injury with systemic symptoms that poses a threat to life or bodily functions Elevated lactic acid level: acute illness or injury Stage 4 chronic kidney disease ([place]): chronic illness or injury with exacerbation, progression, or side effects of treatment that poses a threat to life or bodily functions  Amount and/or Complexity of Data Reviewed External Data Reviewed: notes. Labs: ordered. Decision-making details documented in ED Course. Radiology: ordered and independent interpretation performed. Decision-making details documented in ED Course.  Risk Prescription drug management. Parenteral controlled substances. Decision regarding hospitalization.   Iv ns. Continuous pulse ox and cardiac monitoring. Labs ordered/sent. Imaging ordered.   Reviewed nursing notes and prior charts for additional history. External reports reviewed.   Cardiac monitor: sinus rhythm, rate 70.  Cultures sent.   Ancef iv. Vanc iv. Dilaudid .5 mg iv.   Labs reviewed/interpreted by me -  lactate elevated. LR bolus. Staff alerted to bring  patient back to room.   Xrays reviewed/interpreted by me - no fx.   Given cellulitis, elev lactate/reported fever, mild aki on ckd, hx dm, will consult hospitalists for admission.              Final Clinical Impression(s) / ED Diagnoses Final diagnoses:  None    Rx / DC Orders ED Discharge Orders     None         Lajean Saver, MD 04/24/22 1445

## 2022-04-24 NOTE — ED Notes (Signed)
Ashok Cordia, MD notified of critical lactic 2.4

## 2022-04-24 NOTE — Progress Notes (Signed)
PHARMACY -  BRIEF ANTIBIOTIC NOTE   Pharmacy has received consult(s) for vancomycin from an ED provider.  The patient's profile has been reviewed for ht/wt/allergies/indication/available labs.    One time order(s) placed for vancomycin 2 g + 500 mg (total 2.5 g LD)  Further antibiotics/pharmacy consults should be ordered by admitting physician if indicated.                       Thank you,  Tawnya Crook, PharmD, BCPS Clinical Pharmacist 04/24/2022 10:46 AM

## 2022-04-24 NOTE — Telephone Encounter (Signed)
Nurse Assessment Nurse: Hassell Done, RN, Joelene Millin Date/Time Eilene Ghazi Time): 04/24/2022 8:04:06 AM Confirm and document reason for call. If symptomatic, describe symptoms. ---caller states he injured his leg last week. he has swelling and pain in his right calf, hard to the touch and it is warm to the touch. he states the wound seems infected and it continues to bleed. he has to walk with a walker due to the pain. unsure of temp. Does the patient have any new or worsening symptoms? ---Yes Will a triage be completed? ---Yes Related visit to physician within the last 2 weeks? ---No Does the PT have any chronic conditions? (i.e. diabetes, asthma, this includes High risk factors for pregnancy, etc.) ---Yes List chronic conditions. ---diabetes, CHF Is this a behavioral health or substance abuse call? ---No Guidelines Guideline Title Affirmed Question Affirmed Notes Nurse Date/Time Eilene Ghazi Time) Leg Swelling and Edema [1] Can't walk or can barely walk AND [2] new-onset Alanda Amass 04/24/2022 8:07:05 AM Disp. Time Eilene Ghazi Time) Disposition Final User 04/24/2022 8:08:36 AM Go to ED Now Yes Hassell Done, RN, Joelene Millin PLEASE NOTE: All timestamps contained within this report are represented as [place] Standard Time. CONFIDENTIALTY NOTICE: This fax transmission is intended only for the addressee. It contains information that is legally privileged, confidential or otherwise protected from use or disclosure. If you are not the intended recipient, you are strictly prohibited from reviewing, disclosing, copying using or disseminating any of this information or taking any action in reliance on or regarding this information. If you have received this fax in error, please notify us immediately by telephone so that we can arrange for its return to [place]. Phone: [phone], Toll-Free: [phone], Fax: [phone] Page: 2 of 2 Call Id: [id number] Final Disposition 04/24/2022 8:08:36 AM Go to ED  Now Yes Hassell Done, RN, Renea Ee Disagree/Comply Comply Caller Understands Yes PreDisposition Call Doctor Care Advice Given Per Guideline GO TO ED NOW: * You need to be seen in the Emergency Department. * Go to the ED at ___________ [place] now. Drive carefully. BRING MEDICINES: * Bring a list of your current medicines when you go to the Emergency Department (ER). * Bring the pill bottles too. This will help the doctor (or NP/PA) to make certain you are taking the right medicines and the right dose. CARE ADVICE given per Leg Swelling and Edema (Adult) guideline. Referrals [facility] - ED

## 2022-04-24 NOTE — H&P (Signed)
History and Physical    Patient: Richard [name] GYJ:[id number] DOB: [birth year] DOA: 04/24/2022 DOS: the patient was seen and examined on 04/24/2022 PCP: [name], DO  Patient coming from: Home  Chief Complaint:  Chief Complaint  Patient presents with   Leg Pain   HPI: Richard [name] is a 81 y.o. male with medical history significant of DM2, CKD3b, CAD, HTN, CHB s/p PPM, chronic combined HF, B cell lymphoma, HLD, OSA on CPAP, GERD. Presenting with right knee pain. He reports that he took a fall out of his golf cart about a week ago and roughed up his knee. He was able to complete his round. He had some pain the next few days, but was still able to get in several rounds of golf. Over the last couple of days, he's notice that his knee has become swollen. He has not noticed any drainage. He has been able to maintain full range of active motion, but it has become more painful. He reports that last night he had fevers and chills. When he woke this morning and noticed that his bed was soaked, he talked to his PCP who recommended that he come to the ED for evaluation. He denies any other aggravating or alleviating factors.   Review of Systems: As mentioned in the history of present illness. All other systems reviewed and are negative. Past Medical History:  Diagnosis Date   Anemia    Arthritis    hips   Axillary adenopathy 02/25/2017   Bradycardia    a. holter monitor has demonstrated HRs in 30s and [place]    CAD (coronary artery disease)    a. s/p CABG 2001  b.  07/28/2017 cath:   Severe three-vessel native CAD with total occlusion of LAD, ramus intermedius, first OM and RCA, patent RIMA to PDA, LIMA to LAD, sequential SVG to ramus intermedius and first OM.     Chronic lower back pain    Diffuse large B cell lymphoma (HCC)    Diverticulosis    Esophageal stricture    GERD (gastroesophageal reflux disease)    History of gout    HTN (hypertension)    Mixed hyperlipidemia     OSA on CPAP    with 2L O2 at night   Pancytopenia ([place])    a. related to chemo therapy for B cell lymphoma   Peptic stricture of esophagus    Presence of permanent cardiac pacemaker    sees Dr. Valaria Good pacemaker   SCC (squamous cell carcinoma) 01/31/2021   R zygoma, [facility]   SCC (squamous cell carcinoma) 01/31/2021   L post ankle, [facility]   SCC (squamous cell carcinoma) 01/31/2021   L popliteal, [facility]   Severe aortic stenosis    Spinal stenosis    Squamous cell carcinoma of skin 03/22/2009   Right mandible. SCCis, hypertrophic.    Squamous cell carcinoma of skin 12/25/2021   R lateral cheek, [facility]   Squamous cell carcinoma of skin 12/25/2021   R postauricular neck, [facility]   Squamous cell carcinoma of skin 12/25/2021   R forearm sup, [facility]   Squamous cell carcinoma of skin 12/25/2021   R wrist, [facility]   Type II diabetes mellitus ([place])    Wears dentures    partial upper   Past Surgical History:  Procedure Laterality Date   APPENDECTOMY  ~ [place] Left 11/06/2016   Procedure: CATARACT EXTRACTION PHACO AND INTRAOCULAR LENS PLACEMENT (  [place]);  Surgeon: Estill Cotta, MD;  Location: [facility]S;  Service: Ophthalmology;  Laterality: Left;  Lot # [id number] H [place]: 01:09.4 AP%:25.2 CDE: 30.64   CATARACT EXTRACTION W/PHACO Right 12/04/2016   Procedure: CATARACT EXTRACTION PHACO AND INTRAOCULAR LENS PLACEMENT ([facility]);  Surgeon: Estill Cotta, MD;  Location: [facility];  Service: Ophthalmology;  Laterality: Right;  [place] 1:25.9 AP% 24.1 CDE 39.10 Fluid Pack lot # [id number]   COLONOSCOPY W/ BIOPSIES AND POLYPECTOMY  07/02/2011   CORONARY ANGIOPLASTY  07/02/1991   CORONARY ANGIOPLASTY WITH STENT PLACEMENT  05/31/1997   "1"   CORONARY ARTERY BYPASS GRAFT  03/01/2000   "CABG X5"   ECTROPION REPAIR Right 09/01/2018   Procedure: REPAIR OF ECTROPION BILATERAL upper and lower;  Surgeon: Karle Starch, MD;  Location: [place];  Service: Ophthalmology;   Laterality: Right;  Diabetic - oral meds sleep apnea   ESOPHAGEAL DILATION  X 3-4   Dr. Lyla Son; "last one was in the 1990's"   ESOPHAGOGASTRODUODENOSCOPY     multiple   FLEXIBLE SIGMOIDOSCOPY     multiple   HYDRADENITIS EXCISION Left 02/25/2017   Procedure: EXCISION DEEP LEFT AXILLARY LYMPH NODE;  Surgeon: Fanny Skates, MD;  Location: [place];  Service: General;  Laterality: Left;   INTRAOPERATIVE TRANSTHORACIC ECHOCARDIOGRAM N/A 09/09/2017   Procedure: INTRAOPERATIVE TRANSTHORACIC ECHOCARDIOGRAM;  Surgeon: Burnell Blanks, MD;  Location: [place];  Service: Open Heart Surgery;  Laterality: N/A;   KNEE ARTHROSCOPY Left 07/01/2009   meniscus repair   LEFT HEART CATHETERIZATION WITH CORONARY/GRAFT ANGIOGRAM N/A 03/16/2014   Procedure: LEFT HEART CATHETERIZATION WITH Beatrix Fetters;  Surgeon: Burnell Blanks, MD;  Location: [facility] CATH LAB;  Service: Cardiovascular;  Laterality: N/A;   LUMBAR LAMINECTOMY/DECOMPRESSION MICRODISCECTOMY Right 06/17/2013   Procedure: LUMBAR LAMINECTOMY MICRODISCECTOMY L4-L5 RIGHT EXCISION OF SYNOVIAL CYST RIGHT   (1 LEVEL) RIGHT PARTIAL FACETECTOMY;  Surgeon: Tobi Bastos, MD;  Location: [facility];  Service: Orthopedics;  Laterality: Right;   MYELOGRAM  04/06/2013   lumbar, Dr Gladstone Lighter   ORBITAL LESION EXCISION Right 09/01/2018   Procedure: ORBITOTOMY WITHOUT BONE FLAP WITH REMOVAL OF LESION RIGHT;  Surgeon: Karle Starch, MD;  Location: [place];  Service: Ophthalmology;  Laterality: Right;   PACEMAKER IMPLANT N/A 09/10/2017   Procedure: PACEMAKER IMPLANT;  Surgeon: Deboraha Sprang, MD;  Location: [place] CV LAB;  Service: Cardiovascular;  Laterality: N/A;   PANENDOSCOPY     PERIPHERAL VASCULAR BALLOON ANGIOPLASTY Left 03/16/2021   Procedure: PERIPHERAL VASCULAR BALLOON ANGIOPLASTY;  Surgeon: [name], MD;  Location: [place] CV LAB;  Service: Cardiovascular;  Laterality: Left;  subclavian  vein   PORTACATH  PLACEMENT N/A 03/06/2017   Procedure: INSERTION PORT-A-CATH AND ASPIRATE SEROMA LEFT AXILLA;  Surgeon: Fanny Skates, MD;  Location: [place];  Service: General;  Laterality: N/A;   RIGHT/LEFT HEART CATH AND CORONARY/GRAFT ANGIOGRAPHY N/A 07/28/2017   Procedure: RIGHT/LEFT HEART CATH AND CORONARY/GRAFT ANGIOGRAPHY;  Surgeon: Sherren Mocha, MD;  Location: [place] CV LAB;  Service: Cardiovascular;  Laterality: N/A;   SHOULDER SURGERY Right 08/30/2010   screws placed; "tendons tore off"   SKIN CANCER EXCISION Right    "neck"   TONSILLECTOMY  ~ [place] Left 06/29/2019   Procedure: TOTAL KNEE ARTHROPLASTY;  Surgeon: Paralee Cancel, MD;  Location[facility];  Service: Orthopedics;  Laterality: Left;  70 mins   TRANSCATHETER AORTIC VALVE REPLACEMENT, TRANSFEMORAL N/A 09/09/2017   Procedure: TRANSCATHETER AORTIC VALVE REPLACEMENT, TRANSFEMORAL;  Surgeon: Angelena Form,  Annita Brod, MD;  Location: [place];  Service: Open Heart Surgery;  Laterality: N/A;   UPPER EXTREMITY VENOGRAPHY Left 02/15/2021   Procedure: CENTRAL VENO;  Surgeon: [name], MD;  Location: [place] CV LAB;  Service: Cardiovascular;  Laterality: Left;   UPPER EXTREMITY VENOGRAPHY N/A 03/16/2021   Procedure: UPPER EXTREMITY VENOGRAPHY;  Surgeon: [name], MD;  Location: [place] CV LAB;  Service: Cardiovascular;  Laterality: N/A;   UPPER GI ENDOSCOPY  07/02/2011   Gastritis; Dr Carlean Purl   VASECTOMY     wireless pacemaker placed     Social History:  reports that he has never smoked. He has never used smokeless tobacco. He reports that he does not drink alcohol and does not use drugs.  Allergies  Allergen Reactions   Ace Inhibitors Swelling and Other (See Comments)    Angioedema    Benazepril Swelling and Other (See Comments)    Angioedema; he is not a candidate for any angiotensin receptor blockers because of this significant allergic reaction. Because of a history of documented adverse  serious drug reaction;Medi Alert bracelet  is recommended   Entresto [Sacubitril-Valsartan] Swelling and Other (See Comments)    First-in-Class Angiotensin Receptor Neprilysin Inhibitor- Med was "red-flagged" by the patient's pharmacy for him to NOT take!   Hctz [Hydrochlorothiazide] Anaphylaxis and Swelling    Tongue and lip swelling    Aspirin Other (See Comments)    Gastritis, can aspirin not take 325 mg aspirin    Family History  Problem Relation Age of Onset   Stroke Father    Hypertension Father    Pancreatic cancer Mother    Diabetes Maternal Grandmother    Stroke Maternal Grandmother    Heart attack Paternal Grandmother    Colon cancer Neg Hx    Esophageal cancer Neg Hx    Rectal cancer Neg Hx    Stomach cancer Neg Hx    Ulcers Neg Hx     Prior to Admission medications   Medication Sig Start Date End Date Taking? Authorizing Provider  acetaminophen (TYLENOL) 325 MG tablet Take 650 mg by mouth at bedtime as needed for moderate pain or headache.   Yes [provider]  allopurinol (ZYLOPRIM) 300 MG tablet Take 450 mg by mouth daily.   Yes [provider]  aluminum hydroxide-magnesium carbonate (GAVISCON) 95-358 MG/15ML SUSP Take 15 mLs by mouth as needed for indigestion or heartburn.   Yes [provider]  amLODipine (NORVASC) 5 MG tablet Take 5 mg by mouth daily. 03/30/22  Yes [provider]  apixaban (ELIQUIS) 2.5 MG TABS tablet Take 1 tablet (2.5 mg total) by mouth 2 (two) times daily. 03/20/22  Yes Minus Breeding, MD  Ascorbic Acid (VITAMIN C PO) Take 1 tablet by mouth daily with breakfast.   Yes [provider]  atorvastatin (LIPITOR) 80 MG tablet TAKE 1 TABLET BY MOUTH EVERYDAY AT BEDTIME Patient taking differently: Take 80 mg by mouth at bedtime. 12/21/21  Yes Minus Breeding, MD  azelastine (ASTELIN) 0.1 % nasal spray PLACE 2 SPRAYS INTO BOTH NOSTRILS AT BEDTIME AS NEEDED FOR RHINITIS OR ALLERGIES. 08/03/21  Yes [name], DO  Coenzyme Q10 (CO Q-10 PO) Take 1 capsule by mouth daily.   Yes [provider]  esomeprazole (NEXIUM) 40 MG capsule Take 1 capsule (40 mg total) by mouth daily. Patient taking differently: Take 40 mg by mouth daily before breakfast. 10/15/12  Yes [name], MD  ezetimibe (ZETIA) 10 MG  tablet TAKE 1 TABLET BY MOUTH EVERY DAY Patient taking differently: Take 10 mg by mouth daily. 12/27/21  Yes Hochrein, Jeneen Rinks, MD  FARXIGA 10 MG TABS tablet Take 10 mg by mouth in the morning.   Yes [provider]  fenofibrate 160 MG tablet TAKE 1 TABLET BY MOUTH EVERY DAY Patient taking differently: Take 160 mg by mouth daily. 02/08/22  Yes [name], DO  folic acid (FOLVITE) 1 MG tablet TAKE 2 TABLETS BY MOUTH EVERY DAY Patient taking differently: Take 2 mg by mouth in the morning. 06/21/21  Yes Ennever, Rudell Cobb, MD  glimepiride (AMARYL) 1 MG tablet Take 2 mg by mouth every morning. 04/14/22  Yes [provider]  icosapent Ethyl (VASCEPA) 1 g capsule TAKE 2 CAPSULES BY MOUTH TWICE A DAY Patient taking differently: Take 2 g by mouth in the morning and at bedtime. 11/05/21  Yes Lowne Chase, Kendrick Fries R, DO  Menthol-Camphor (ICY HOT ADVANCED PAIN RELIEF EX) Apply 1 application  topically daily as needed (to painful sites).   Yes [provider]  metoprolol succinate (TOPROL-XL) 25 MG 24 hr tablet Take 1 tablet (25 mg total) by mouth daily. 02/08/22  Yes Hochrein, Jeneen Rinks, MD  nitroGLYCERIN (NITROSTAT) 0.4 MG SL tablet PLACE 1 TABLET (0.4 MG TOTAL) UNDER THE TONGUE EVERY 5 (FIVE) MINUTES AS NEEDED FOR CHEST PAIN. 06/27/21  Yes Minus Breeding, MD  potassium chloride SA (KLOR-CON M20) 20 MEQ tablet Take 2 tablets (40 mEq total) by mouth daily. 01/25/22  Yes Hongalgi, Lenis Dickinson, MD  pregabalin (LYRICA) 150 MG capsule TAKE 1 CAPSULE BY MOUTH TWICE A DAY 02/25/22  Yes Roma Schanz R, DO  psyllium (METAMUCIL) 58.6 % powder Take 1 packet by mouth daily as needed  (for constipation- mix and drink).   Yes [provider]  senna (SENOKOT) 8.6 MG TABS tablet Take 2 tablets (17.2 mg total) by mouth at bedtime. This is for constipation.  Stop taking if you start having diarrhea. Patient taking differently: Take 2 tablets by mouth daily as needed for mild constipation. This is for constipation.  Stop taking if you start having diarrhea. 01/23/22  Yes Hongalgi, Lenis Dickinson, MD  torsemide (DEMADEX) 20 MG tablet Take 2 tablets (40 mg total) by mouth in the morning. Do not take on 01/24/2022. 03/05/22  Yes Minus Breeding, MD  FREESTYLE LITE test strip USE TO TEST BLOOD SUGAR ONCE A DAY. DX CODE: E11.9 06/21/21   Carollee Herter, Alferd Apa, DO  Lancets (FREESTYLE) lancets USE ONCE A DAY TO CHECK BLOOD SUGAR. 10/09/21   [name], DO    Physical Exam: Vitals:   04/24/22 1005 04/24/22 1017 04/24/22 1404  BP: 128/67  115/61  Pulse: 70  66  Resp: 18  18  Temp: 98.8 F (37.1 C)  (!) 97.4 F (36.3 C)  TempSrc: Oral  Oral  SpO2: 94%  96%  Weight:  (!) 151 kg   Height:  6' (1.829 m)    General: 81 y.o. male resting in bed in NAD Eyes: PERRL, normal sclera ENMT: Nares patent w/o discharge, orophaynx clear, dentition normal, ears w/o discharge/lesions/ulcers Neck: Supple, trachea midline Cardiovascular: RRR, +S1, S2, no m/g/r, equal pulses throughout Respiratory: CTABL, no w/r/r, normal WOB GI: BS+, NDNT, no masses noted, no organomegaly noted MSK: No c/c; erythema and edema of RLE, knee w/ scabbing but minimal swelling, he has full ROM both active and passive Neuro: A&O x 3, no focal deficits Psyc: Appropriate interaction and  affect, calm/cooperative  Data Reviewed:  Results for orders placed or performed during the hospital encounter of 04/24/22 (from the past 24 hour(s))  CBC     Status: Abnormal   Collection Time: 04/24/22 11:41 AM  Result Value Ref Range   WBC 9.4 4.0 - 10.5 K/uL   RBC 4.77 4.22 - 5.81 MIL/uL   Hemoglobin 13.9 13.0 - 17.0 g/dL    HCT 44.6 39.0 - 52.0 %   MCV 93.5 80.0 - 100.0 fL   MCH 29.1 26.0 - 34.0 pg   MCHC 31.2 30.0 - 36.0 g/dL   RDW 17.2 (H) 11.5 - 15.5 %   Platelets 202 150 - 400 K/uL   nRBC 0.0 0.0 - 0.2 %  Comprehensive metabolic panel     Status: Abnormal   Collection Time: 04/24/22 11:41 AM  Result Value Ref Range   Sodium 137 135 - 145 mmol/L   Potassium 4.1 3.5 - 5.1 mmol/L   Chloride 98 98 - 111 mmol/L   CO2 28 22 - 32 mmol/L   Glucose, Bld 282 (H) 70 - 99 mg/dL   BUN 43 (H) 8 - 23 mg/dL   Creatinine, Ser 2.00 (H) 0.61 - 1.24 mg/dL   Calcium 9.8 8.9 - 10.3 mg/dL   Total Protein 7.7 6.5 - 8.1 g/dL   Albumin 4.2 3.5 - 5.0 g/dL   AST 23 15 - 41 U/L   ALT 23 0 - 44 U/L   Alkaline Phosphatase 61 38 - 126 U/L   Total Bilirubin 0.9 0.3 - 1.2 mg/dL   GFR, Estimated 33 (L) >60 mL/min   Anion gap 11 5 - 15  Lactic acid, plasma     Status: Abnormal   Collection Time: 04/24/22 11:41 AM  Result Value Ref Range   Lactic Acid, Venous 2.4 (HH) 0.5 - 1.9 mmol/L   *Note: Due to a large number of results and/or encounters for the requested time period, some results have not been displayed. A complete set of results can be found in Results Review.   DG Tib/Fib right Soft tissue swelling of the right lower extremity most prominent anteriorly along the tibia and the prepatellar soft tissues and with adjacent prepatellar skin irregularity. Chronic posttraumatic deformity of the tibial diaphysis with adjacent heterotopic ossification. No acute osseous abnormality.  Assessment and Plan: RLE Cellulitis     - admit to inpt, tele     - continue vanc/ancef; follow blood cultures     - good ROM; less concern for septic joint, will hold off on ortho consult for now  Lactic acidosis     - secondary to above, continue fluids, abx  DM2     - A1c, DM diet, glucose checks, SSI  CAD HLD      - continue home regimen  CKD3b     - at baseline, follow  HTN     - continue home regimen  Chronic combined  systolic/diastolic HF     - no evidence of volume overload     - fluids today     - I&O, daily weight     - resume home regimen in AM  Hx of gout     - continue home regimen  Hx of a flutter CHB s/p PPN S/p TAVER     - continue home regimen and outpt follow up  OSA on CPAP     - CPAP qHS  Advance Care Planning:   Code Status: FULL  Consults: None  Family Communication:  None at bedside  Severity of Illness: The appropriate patient status for this patient is INPATIENT. Inpatient status is judged to be reasonable and necessary in order to provide the required intensity of service to ensure the patient's safety. The patient's presenting symptoms, physical exam findings, and initial radiographic and laboratory data in the context of their chronic comorbidities is felt to place them at high risk for further clinical deterioration. Furthermore, it is not anticipated that the patient will be medically stable for discharge from the hospital within 2 midnights of admission.   * I certify that at the point of admission it is my clinical judgment that the patient will require inpatient hospital care spanning beyond 2 midnights from the point of admission due to high intensity of service, high risk for further deterioration and high frequency of surveillance required.*  Author: Jonnie Finner, DO 04/24/2022 3:41 PM  For on call review [email or web address].

## 2022-04-24 NOTE — Progress Notes (Addendum)
Pharmacy Antibiotic Note  Richard [name] is a 81 y.o. male admitted on 04/24/2022 with cellulitis.  Pharmacy has been consulted for vancomycin dosing. SCr noted to be 1.8-2 at baseline. Given Ancef x 1 in ED.  Plan: Vancomycin 2000 mg IV now, then 1000 mg IV q24 hr (est AUC 443 based on SCr 2.0; Vd 0.5) Measure vancomycin AUC at steady state as indicated SCr q48 while on vanc   Height: 6' (182.9 cm) Weight: (!) 151 kg (333 lb) IBW/kg (Calculated) : 77.6  Temp (24hrs), Avg:98.2 F (36.8 C), Min:97.4 F (36.3 C), Max:98.8 F (37.1 C)  Recent Labs  Lab 04/24/22 1141 04/24/22 1428  WBC 9.4  --   CREATININE 2.00*  --   LATICACIDVEN 2.4* 2.0*    Estimated Creatinine Clearance: 42.4 mL/min (A) (by C-G formula based on SCr of 2 mg/dL (H)).    Allergies  Allergen Reactions   Ace Inhibitors Swelling and Other (See Comments)    Angioedema    Benazepril Swelling and Other (See Comments)    Angioedema; he is not a candidate for any angiotensin receptor blockers because of this significant allergic reaction. Because of a history of documented adverse serious drug reaction;Medi Alert bracelet  is recommended   Entresto [Sacubitril-Valsartan] Swelling and Other (See Comments)    First-in-Class Angiotensin Receptor Neprilysin Inhibitor- Med was "red-flagged" by the patient's pharmacy for him to NOT take!   Hctz [Hydrochlorothiazide] Anaphylaxis and Swelling    Tongue and lip swelling    Aspirin Other (See Comments)    Gastritis, can aspirin not take 325 mg aspirin    Thank you for allowing pharmacy to be a part of this patient's care.  Richard [name] A 04/24/2022 9:00 PM

## 2022-04-24 NOTE — ED Triage Notes (Signed)
Pt to er for some pain in the back of his R leg, states that he fell about 10 days ago, pt has scabs on his R knee, pt states that last night the pain in his leg was worse, states that he had a fever last night.  Pt has redness to R lower leg

## 2022-04-24 NOTE — Telephone Encounter (Signed)
Pt in ED.  

## 2022-04-25 ENCOUNTER — Telehealth: Payer: Self-pay | Admitting: *Deleted

## 2022-04-25 DIAGNOSIS — L03115 Cellulitis of right lower limb: Secondary | ICD-10-CM | POA: Diagnosis not present

## 2022-04-25 LAB — COMPREHENSIVE METABOLIC PANEL
ALT: 21 U/L (ref 0–44)
AST: 23 U/L (ref 15–41)
Albumin: 3.5 g/dL (ref 3.5–5.0)
Alkaline Phosphatase: 49 U/L (ref 38–126)
Anion gap: 9 (ref 5–15)
BUN: 42 mg/dL — ABNORMAL HIGH (ref 8–23)
CO2: 26 mmol/L (ref 22–32)
Calcium: 8.9 mg/dL (ref 8.9–10.3)
Chloride: 101 mmol/L (ref 98–111)
Creatinine, Ser: 1.84 mg/dL — ABNORMAL HIGH (ref 0.61–1.24)
GFR, Estimated: 36 mL/min — ABNORMAL LOW (ref >60)
Glucose, Bld: 147 mg/dL — ABNORMAL HIGH (ref 70–99)
Potassium: 3.7 mmol/L (ref 3.5–5.1)
Sodium: 136 mmol/L (ref 135–145)
Total Bilirubin: 1 mg/dL (ref 0.3–1.2)
Total Protein: 7 g/dL (ref 6.5–8.1)

## 2022-04-25 LAB — CBC
HCT: 39.4 % (ref 39.0–52.0)
Hemoglobin: 12.5 g/dL — ABNORMAL LOW (ref 13.0–17.0)
MCH: 29.3 pg (ref 26.0–34.0)
MCHC: 31.7 g/dL (ref 30.0–36.0)
MCV: 92.3 fL (ref 80.0–100.0)
Platelets: 176 10*3/uL (ref 150–400)
RBC: 4.27 MIL/uL (ref 4.22–5.81)
RDW: 17.2 % — ABNORMAL HIGH (ref 11.5–15.5)
WBC: 8.5 10*3/uL (ref 4.0–10.5)
nRBC: 0 % (ref 0.0–0.2)

## 2022-04-25 LAB — CBG MONITORING, ED: Glucose-Capillary: 148 mg/dL — ABNORMAL HIGH (ref 70–99)

## 2022-04-25 LAB — HEMOGLOBIN A1C
Hgb A1c MFr Bld: 8.1 % — ABNORMAL HIGH (ref 4.8–5.6)
Mean Plasma Glucose: 185.77 mg/dL

## 2022-04-25 LAB — CREATININE, SERUM
Creatinine, Ser: 1.98 mg/dL — ABNORMAL HIGH (ref 0.61–1.24)
GFR, Estimated: 33 mL/min — ABNORMAL LOW (ref >60)

## 2022-04-25 LAB — GLUCOSE, CAPILLARY
Glucose-Capillary: 140 mg/dL — ABNORMAL HIGH (ref 70–99)
Glucose-Capillary: 198 mg/dL — ABNORMAL HIGH (ref 70–99)

## 2022-04-25 MED ORDER — ACETAMINOPHEN 650 MG RE SUPP: 650.0000 mg | Freq: Four times a day (QID) | RECTAL | Status: DC | PRN

## 2022-04-25 MED ORDER — HYDROMORPHONE HCL 1 MG/ML IJ SOLN
0.5000 mg | INTRAMUSCULAR | Status: DC | PRN
  Administered 2022-04-25 – 2022-04-26 (×2): 0.5 mg via INTRAVENOUS
  Filled 2022-04-25: qty 1
  Filled 2022-04-25: qty 0.5

## 2022-04-25 MED ORDER — AMLODIPINE BESYLATE 5 MG PO TABS
5.0000 mg | ORAL_TABLET | Freq: Every day | ORAL | Status: DC
  Administered 2022-04-25 – 2022-04-27 (×3): 5 mg via ORAL
  Filled 2022-04-25 (×3): qty 1

## 2022-04-25 MED ORDER — ONDANSETRON HCL 4 MG PO TABS: 4.0000 mg | ORAL_TABLET | Freq: Four times a day (QID) | ORAL | Status: DC | PRN

## 2022-04-25 MED ORDER — DAPAGLIFLOZIN PROPANEDIOL 10 MG PO TABS
10.0000 mg | ORAL_TABLET | Freq: Every day | ORAL | Status: DC
  Administered 2022-04-25 – 2022-04-27 (×3): 10 mg via ORAL
  Filled 2022-04-25 (×3): qty 1

## 2022-04-25 MED ORDER — EZETIMIBE 10 MG PO TABS
10.0000 mg | ORAL_TABLET | Freq: Every day | ORAL | Status: DC
  Administered 2022-04-25 – 2022-04-27 (×3): 10 mg via ORAL
  Filled 2022-04-25 (×3): qty 1

## 2022-04-25 MED ORDER — SODIUM CHLORIDE 0.9 % IV SOLN: INTRAVENOUS | Status: DC

## 2022-04-25 MED ORDER — ATORVASTATIN CALCIUM 40 MG PO TABS
80.0000 mg | ORAL_TABLET | Freq: Every day | ORAL | Status: DC
  Administered 2022-04-25 – 2022-04-26 (×2): 80 mg via ORAL
  Filled 2022-04-25 (×2): qty 2

## 2022-04-25 MED ORDER — ALLOPURINOL 300 MG PO TABS
450.0000 mg | ORAL_TABLET | Freq: Every day | ORAL | Status: DC
  Administered 2022-04-25 – 2022-04-27 (×3): 450 mg via ORAL
  Filled 2022-04-25: qty 2
  Filled 2022-04-25: qty 1.5
  Filled 2022-04-25: qty 2

## 2022-04-25 MED ORDER — ACETAMINOPHEN 325 MG PO TABS
650.0000 mg | ORAL_TABLET | Freq: Four times a day (QID) | ORAL | Status: DC | PRN
  Administered 2022-04-25: 650 mg via ORAL
  Filled 2022-04-25: qty 2

## 2022-04-25 MED ORDER — PANTOPRAZOLE SODIUM 40 MG PO TBEC
80.0000 mg | DELAYED_RELEASE_TABLET | Freq: Every day | ORAL | Status: DC
  Administered 2022-04-25 – 2022-04-26 (×2): 80 mg via ORAL
  Filled 2022-04-25 (×2): qty 2

## 2022-04-25 MED ORDER — CEFAZOLIN SODIUM-DEXTROSE 2-4 GM/100ML-% IV SOLN
2.0000 g | Freq: Three times a day (TID) | INTRAVENOUS | Status: DC
  Administered 2022-04-25 – 2022-04-27 (×7): 2 g via INTRAVENOUS
  Filled 2022-04-25 (×8): qty 100

## 2022-04-25 MED ORDER — PREGABALIN 75 MG PO CAPS
150.0000 mg | ORAL_CAPSULE | Freq: Two times a day (BID) | ORAL | Status: DC
  Administered 2022-04-25 – 2022-04-27 (×5): 150 mg via ORAL
  Filled 2022-04-25: qty 3
  Filled 2022-04-25 (×4): qty 2

## 2022-04-25 MED ORDER — OXYCODONE HCL 5 MG PO TABS: 5.0000 mg | ORAL_TABLET | ORAL | Status: DC | PRN

## 2022-04-25 MED ORDER — PREGABALIN 50 MG PO CAPS
150.0000 mg | ORAL_CAPSULE | Freq: Once | ORAL | Status: AC
  Administered 2022-04-25: 150 mg via ORAL
  Filled 2022-04-25: qty 3

## 2022-04-25 MED ORDER — APIXABAN 2.5 MG PO TABS
2.5000 mg | ORAL_TABLET | Freq: Two times a day (BID) | ORAL | Status: DC
  Administered 2022-04-25 – 2022-04-27 (×5): 2.5 mg via ORAL
  Filled 2022-04-25 (×5): qty 1

## 2022-04-25 MED ORDER — INSULIN ASPART 100 UNIT/ML IJ SOLN
0.0000 [IU] | Freq: Three times a day (TID) | INTRAMUSCULAR | Status: DC
  Administered 2022-04-25 (×2): 2 [IU] via SUBCUTANEOUS
  Administered 2022-04-26: 3 [IU] via SUBCUTANEOUS
  Administered 2022-04-26 (×2): 2 [IU] via SUBCUTANEOUS
  Administered 2022-04-27: 3 [IU] via SUBCUTANEOUS
  Filled 2022-04-25: qty 0.15

## 2022-04-25 MED ORDER — ICOSAPENT ETHYL 1 G PO CAPS
2.0000 g | ORAL_CAPSULE | Freq: Two times a day (BID) | ORAL | Status: DC
  Administered 2022-04-25 – 2022-04-27 (×5): 2 g via ORAL
  Filled 2022-04-25 (×5): qty 2

## 2022-04-25 MED ORDER — FENOFIBRATE 160 MG PO TABS
160.0000 mg | ORAL_TABLET | Freq: Every day | ORAL | Status: DC
  Administered 2022-04-25 – 2022-04-27 (×3): 160 mg via ORAL
  Filled 2022-04-25 (×3): qty 1

## 2022-04-25 MED ORDER — FOLIC ACID 1 MG PO TABS
2.0000 mg | ORAL_TABLET | Freq: Every morning | ORAL | Status: DC
  Administered 2022-04-25 – 2022-04-27 (×3): 2 mg via ORAL
  Filled 2022-04-25 (×3): qty 2

## 2022-04-25 MED ORDER — METOPROLOL SUCCINATE ER 25 MG PO TB24
25.0000 mg | ORAL_TABLET | Freq: Every day | ORAL | Status: DC
  Administered 2022-04-25 – 2022-04-27 (×3): 25 mg via ORAL
  Filled 2022-04-25 (×3): qty 1

## 2022-04-25 MED ORDER — ONDANSETRON HCL 4 MG/2ML IJ SOLN: 4.0000 mg | Freq: Four times a day (QID) | INTRAMUSCULAR | Status: DC | PRN

## 2022-04-25 MED ORDER — INSULIN ASPART 100 UNIT/ML IJ SOLN
0.0000 [IU] | Freq: Every day | INTRAMUSCULAR | Status: DC
  Filled 2022-04-25: qty 0.05

## 2022-04-25 NOTE — Telephone Encounter (Signed)
Per 04/17/22 los - mailed calendar

## 2022-04-25 NOTE — Consult Note (Signed)
[facility] Nurse Consult Note: Reason for Consult:right knee with area of elevated and dried serum/blood. Patient sustained a traumatic injury (see admitting provider's notes). Past Medical history includes B-cell lymphoma. Wound type:trauma, infectious Pressure Injury POA: N/A Measurement:TOP be obtained by bedside RN and documented on Nursing flow sheet with next dressing change today Wound LKH:VFMBB dried exudate (serum and/or blood), elevated Drainage (amount, consistency, odor) None Periwound: intact with mild erythema, edema Dressing procedure/placement/frequency: Cleanse with NS, pat dry. Apply a betadine soaked dressing to the tissue, top with dry gauze 4x4s and secure with a few turns of Kerlix roll gauze/paper tape.  Change twice daily. Hells are to be floated and a sacral silicone foam dressing placed ot the sacrum for PI prevention.  I have communicated with Dr.Kyle that consultation with [facility] for consideration of removal (bedside debridement) of the nonviable tissue may be indicated to hasten treatment of the wound bed beneath given the neoplastic medical history of the patient. He has included Dr. [place] in the conversation as Dr. [place] is currently covering the patient. I will leave the decision to consult with [facility] in their hands.  [facility] nursing team will not follow, but will remain available to this patient, the nursing and medical teams.  Please re-consult if needed.  Thank you for inviting [place] to participate in this patient's Plan of Care.  Maudie Flakes, MSN, RN, CNS, [place], Serita Grammes, [employer], [employer] phone:  [phone]

## 2022-04-25 NOTE — ED Notes (Signed)
Pt states he brought his CPAP from home. He uses this every night. RN called RT to get set up for pt

## 2022-04-25 NOTE — Progress Notes (Signed)
PROGRESS NOTE    Richard [name]  KCL:[id number] DOB: 06-21-1941 DOA: 04/24/2022 PCP: [name], DO    Brief Narrative:   Richard [name] is a 81 y.o. male with past medical history significant for type 2 diabetes mellitus, CKD stage IIIb, CAD, HTN, CHB s/p PPM, chronic combined systolic/diastolic congestive heart failure, B-cell lymphoma, HLD, OSA on CPAP, GERD who presented to [facility] ED with right lower extremity pain, redness and swelling.  Patient reports fell roughly 10 days ago while playing golf.  Since fall, he has still been able to play several rounds of golf but has noted that his knee and left lower extremity has become more red and swollen.  Denies any purulent discharge and no knee joint pain with full range of motion intact.  When he awoke this morning he noted that his bed was soaked.  He contacted his PCP who recommended evaluation in the emergency department.  In the ED, temperature 98.8 F, HR 70, RR 18, BP 128/67, SPO2 94% on room air.  WBC 9.4, hemoglobin 13.9, platelet count 202.  Sodium 137, potassium 4.1, chloride 98, CO2 28, BUN 43, creatinine 2.00.  AST 23, ALT 23, total bilirubin 0.9.  Lactic acid 2.4.  Right tibia/fibula x-ray with soft tissue swelling of the right lower extremity most prominent anteriorly along the tibia and prepatellar soft tissues with adjacent prepatellar skin irregularity, chronic posttraumatic deformity of the tibial diaphysis with adjacent heterotrophic ossification, no acute osseous abnormality.  Patient was started on antibiotics.  Hospital service consulted for further evaluation management of right lower extremity cellulitis.  Assessment & Plan:   Right lower extremity cellulitis Patient presenting with 10-day history of right lower extremity swelling, erythema following fall and sustaining abrasion to right knee. -- Vancomycin, pharmacy consulted for dosing/monitoring -- Cefazolin 2 g IV every 8 hours -- Wound care consult -- CBC  daily  Lactic acidosis Lactic acid 2.4 on admission, likely secondary to infectious process as above versus dehydration. -- Continue IV fluids, IV antibiotics -- Repeat lactic acid in the a.m.  Type 2 diabetes mellitus Hemoglobin A1c 8.1, not optimally controlled.  Home regimen includes glimepiride 2 mg p.o. daily, Farxiga 10 mg p.o. daily. -- Hold glimepiride while inpatient -- Continue Farxiga -- Moderate SSI for coverage -- CBG's before every meal/at bedtime  CAD Hyperlipidemia -- Atorvastatin 80 mg p.o. daily --Vascepa 2 g p.o. twice daily --Fenofibrate 160 mg p.o. daily --Zetia 10 mg p.o. daily  Chronic combined systolic/diastolic congestive heart failure, compensated --Metoprolol succinate 25 mg p.o. daily --Holding home torsemide for now --Strict I's and O's and daily weights  History of a flutter CHB s/p PPM History of aortic stenosis s/p TAVR Stable, continue Eliquis 2.5 mg p.o. twice daily.  Outpatient follow-up with cardiology  Essential hypertension --Amlodipine 5 mg p.o. daily --Metoprolol succinate 25 mg p.o. daily  CKD stage IIIb Creatinine 2.00 on admission.  Baseline 1.7-1.9, stable. --Cr 2.00>1.98>1.84 -- Holding home torsemide for now -- Avoid nephrotoxins, renally dose all medications -- BMP daily  History of gout -- Allopurinol 450 mg p.o. daily  Peripheral neuropathy -- Lyrica 150 mg p.o. twice daily  GERD: Continue PPI  OSA: Continue nocturnal CPAP   DVT prophylaxis: apixaban (ELIQUIS) tablet 2.5 mg Start: 04/25/22 1000 apixaban (ELIQUIS) tablet 2.5 mg    Code Status: Full Code Family Communication: No family present at bedside  Disposition Plan:  Level of care: Telemetry Status is: Inpatient Remains inpatient appropriate because: IV antibiotics  Consultants:  None  Procedures:  None  Antimicrobials:  Vancomycin 10/25>> Cefazolin 10/25>>   Subjective: Patient seen examined bedside, resting comfortably.  Lying in bed.   Remains in ED holding area.  Patient's upset this morning that he has not received any of his medications overnight.  Apparently medications are still under signed and held orders.  Messaged RN this morning regarding need for release.  Otherwise no other complaints or concerns at this time.  Denies any right knee pain with active or passive range of motion.  Discussed with patient regarding nature of cellulitis.  Patient with notable erythema to right lower leg.  No other specific questions or concerns at this time.  Denies headache, no dizziness, no chest pain, no palpitations, no fever/chills/night sweats, no nausea/vomiting/diarrhea, no focal weakness, no fatigue, no cough/congestion, no paresthesias.  No acute events overnight per nursing staff.  Objective: Vitals:   04/25/22 0750 04/25/22 1000 04/25/22 1130 04/25/22 1330  BP:  (!) 136/90 132/77 114/66  Pulse:  74 73 67  Resp:  '16 16 19  '$ Temp: 99.4 F (37.4 C)  98.8 F (37.1 C) 97.6 F (36.4 C)  TempSrc: Oral  Oral Oral  SpO2:  91% 95% 96%  Weight:      Height:        Intake/Output Summary (Last 24 hours) at 04/25/2022 1334 Last data filed at 04/25/2022 1040 Gross per 24 hour  Intake 100 ml  Output 750 ml  Net -650 ml   Filed Weights   04/24/22 1017  Weight: (!) 151 kg    Examination:  Physical Exam: GEN: NAD, alert and oriented x 3, obese HEENT: NCAT, PERRL, EOMI, sclera clear, MMM PULM: CTAB w/o wheezes/crackles, normal respiratory effort, on room air CV: RRR w/o M/G/R GI: abd soft, NTND, NABS, no R/G/M MSK: no peripheral edema, muscle strength globally intact 5/5 bilateral upper/lower extremities NEURO: CN II-XII intact, no focal deficits, sensation to light touch intact PSYCH: normal mood/affect Integumentary: Right knee with scab present, erythema noted to right lower extremity as depicted below; otherwise no other concerning rashes/lesions/wounds noted on exposed skin surfaces         Data Reviewed: I have  personally reviewed following labs and imaging studies  CBC: Recent Labs  Lab 04/24/22 1141 04/25/22 0751  WBC 9.4 8.5  HGB 13.9 12.5*  HCT 44.6 39.4  MCV 93.5 92.3  PLT 202 449   Basic Metabolic Panel: Recent Labs  Lab 04/24/22 1141 04/25/22 0440 04/25/22 0751  NA 137  --  136  K 4.1  --  3.7  CL 98  --  101  CO2 28  --  26  GLUCOSE 282*  --  147*  BUN 43*  --  42*  CREATININE 2.00* 1.98* 1.84*  CALCIUM 9.8  --  8.9   GFR: Estimated Creatinine Clearance: 46 mL/min (A) (by C-G formula based on SCr of 1.84 mg/dL (H)). Liver Function Tests: Recent Labs  Lab 04/24/22 1141 04/25/22 0751  AST 23 23  ALT 23 21  ALKPHOS 61 49  BILITOT 0.9 1.0  PROT 7.7 7.0  ALBUMIN 4.2 3.5   No results for input(s): "LIPASE", "AMYLASE" in the last 168 hours. No results for input(s): "AMMONIA" in the last 168 hours. Coagulation Profile: No results for input(s): "INR", "PROTIME" in the last 168 hours. Cardiac Enzymes: No results for input(s): "CKTOTAL", "CKMB", "CKMBINDEX", "TROPONINI" in the last 168 hours. BNP (last 3 results) Recent Labs    11/29/21 1401 01/15/22 1122  PROBNP 5,385* 1,211.0*   HbA1C: Recent Labs    04/25/22 0804  HGBA1C 8.1*   CBG: Recent Labs  Lab 04/25/22 0820  GLUCAP 148*   Lipid Profile: No results for input(s): "CHOL", "HDL", "LDLCALC", "TRIG", "CHOLHDL", "LDLDIRECT" in the last 72 hours. Thyroid Function Tests: No results for input(s): "TSH", "T4TOTAL", "FREET4", "T3FREE", "THYROIDAB" in the last 72 hours. Anemia Panel: No results for input(s): "VITAMINB12", "FOLATE", "FERRITIN", "TIBC", "IRON", "RETICCTPCT" in the last 72 hours. Sepsis Labs: Recent Labs  Lab 04/24/22 1141 04/24/22 1428  LATICACIDVEN 2.4* 2.0*    Recent Results (from the past 240 hour(s))  Blood culture (routine x 2)     Status: None (Preliminary result)   Collection Time: 04/24/22 11:41 AM   Specimen: BLOOD  Result Value Ref Range Status   Specimen Description    Final    BLOOD BLOOD RIGHT ARM Performed at [facility] [address] [place], [place] [address]    Special Requests   Final    BOTTLES DRAWN AEROBIC AND ANAEROBIC Blood Culture adequate volume Performed at [facility] [address] [place], [place] [address]    Culture   Final    NO GROWTH < 24 HOURS Performed at [facility] [address], [place], [place] [address]    Report Status PENDING  Incomplete  Blood culture (routine x 2)     Status: None (Preliminary result)   Collection Time: 04/24/22  2:28 PM   Specimen: BLOOD  Result Value Ref Range Status   Specimen Description   Final    BLOOD SITE NOT SPECIFIED Performed at [facility] [address], [place], [place] [address]    Special Requests   Final    BOTTLES DRAWN AEROBIC AND ANAEROBIC Blood Culture adequate volume Performed at [facility] [address], [place], [place] [address]    Culture   Final    NO GROWTH < 24 HOURS Performed at [facility] [address], [place], [place] [address]    Report Status PENDING  Incomplete         Radiology Studies: DG Tibia/Fibula Right  Result Date: 04/24/2022 CLINICAL DATA:  Fall, pain EXAM: RIGHT TIBIA AND FIBULA - 2 VIEW COMPARISON:  None Available. FINDINGS: There is no evidence of acute fracture. Alignment is normal. Chronic post traumatic deformity of the tibial diaphysis. Adjacent heterotopic ossifiction at the interosseous membrane/medial fibular cortex. Mild track a osteoarthritis of the knee. There is soft tissue swelling of the lower extremity most prominent anteriorly along the tibia and prepatellar soft tissues. There is prepatellar skin irregularity. There are postsurgical changes overlying the medial soft tissues and vascular calcifications. IMPRESSION: Soft tissue swelling of the right lower extremity most prominent anteriorly along the tibia and the prepatellar soft  tissues and with adjacent prepatellar skin irregularity. Chronic posttraumatic deformity of the tibial diaphysis with adjacent heterotopic ossification. No acute osseous abnormality. Electronically Signed   By: Maurine Simmering M.D.   On: 04/24/2022 12:02        Scheduled Meds:  allopurinol  450 mg Oral Daily   amLODipine  5 mg Oral Daily   apixaban  2.5 mg Oral BID   atorvastatin  80 mg Oral QHS   dapagliflozin propanediol  10 mg Oral Daily   ezetimibe  10 mg Oral Daily   fenofibrate  160 mg Oral Daily   folic acid  2 mg Oral q AM   icosapent  Ethyl  2 g Oral BID   insulin aspart  0-15 Units Subcutaneous TID WC   insulin aspart  0-5 Units Subcutaneous QHS   metoprolol succinate  25 mg Oral Daily   pantoprazole  80 mg Oral Q1200   pregabalin  150 mg Oral BID   Continuous Infusions:  sodium chloride 75 mL/hr at 04/25/22 0842    ceFAZolin (ANCEF) IV Stopped (04/25/22 1040)   vancomycin       LOS: 1 day    Time spent: 51 minutes spent on chart review, discussion with nursing staff, consultants, updating family and interview/physical exam; more than 50% of that time was spent in counseling and/or coordination of care.    Mykale Gandolfo J [place] DO [facility] Available via Epic secure chat 7am-7pm After these hours, please refer to coverage provider listed on [email or web address] 04/25/2022, 1:34 PM

## 2022-04-26 ENCOUNTER — Inpatient Hospital Stay (HOSPITAL_COMMUNITY): Payer: Medicare Other

## 2022-04-26 DIAGNOSIS — M7989 Other specified soft tissue disorders: Secondary | ICD-10-CM

## 2022-04-26 DIAGNOSIS — L538 Other specified erythematous conditions: Secondary | ICD-10-CM | POA: Diagnosis not present

## 2022-04-26 DIAGNOSIS — L0390 Cellulitis, unspecified: Secondary | ICD-10-CM

## 2022-04-26 DIAGNOSIS — L03115 Cellulitis of right lower limb: Secondary | ICD-10-CM | POA: Diagnosis not present

## 2022-04-26 LAB — GLUCOSE, CAPILLARY
Glucose-Capillary: 126 mg/dL — ABNORMAL HIGH (ref 70–99)
Glucose-Capillary: 179 mg/dL — ABNORMAL HIGH (ref 70–99)
Glucose-Capillary: 172 mg/dL — ABNORMAL HIGH (ref 70–99)
Glucose-Capillary: 153 mg/dL — ABNORMAL HIGH (ref 70–99)

## 2022-04-26 LAB — CBC
HCT: 39.3 % (ref 39.0–52.0)
Hemoglobin: 12.6 g/dL — ABNORMAL LOW (ref 13.0–17.0)
MCH: 29.4 pg (ref 26.0–34.0)
MCHC: 32.1 g/dL (ref 30.0–36.0)
MCV: 91.6 fL (ref 80.0–100.0)
Platelets: 165 10*3/uL (ref 150–400)
RBC: 4.29 MIL/uL (ref 4.22–5.81)
RDW: 17 % — ABNORMAL HIGH (ref 11.5–15.5)
WBC: 8.2 10*3/uL (ref 4.0–10.5)
nRBC: 0 % (ref 0.0–0.2)

## 2022-04-26 LAB — BASIC METABOLIC PANEL
Anion gap: 10 (ref 5–15)
BUN: 36 mg/dL — ABNORMAL HIGH (ref 8–23)
CO2: 25 mmol/L (ref 22–32)
Calcium: 8.9 mg/dL (ref 8.9–10.3)
Chloride: 101 mmol/L (ref 98–111)
Creatinine, Ser: 1.76 mg/dL — ABNORMAL HIGH (ref 0.61–1.24)
GFR, Estimated: 38 mL/min — ABNORMAL LOW (ref >60)
Glucose, Bld: 146 mg/dL — ABNORMAL HIGH (ref 70–99)
Potassium: 3.9 mmol/L (ref 3.5–5.1)
Sodium: 136 mmol/L (ref 135–145)

## 2022-04-26 LAB — LACTIC ACID, PLASMA: Lactic Acid, Venous: 1.6 mmol/L (ref 0.5–1.9)

## 2022-04-26 MED ORDER — LORATADINE 10 MG PO TABS
10.0000 mg | ORAL_TABLET | Freq: Every day | ORAL | Status: DC
  Administered 2022-04-26 – 2022-04-27 (×2): 10 mg via ORAL
  Filled 2022-04-26 (×2): qty 1

## 2022-04-26 NOTE — Progress Notes (Signed)
Lower extremity venous right study completed.  Preliminary results relayed to RN on the floor.  See CV Proc for preliminary results report.   Darlin Coco, RDMS, RVT

## 2022-04-26 NOTE — Progress Notes (Signed)
  Transition of Care [facility]) Screening Note   Patient Details  Name: JAMES SENN Date of Birth: February 22, 1941   Transition of Care [facility]) CM/SW Contact:    Vassie Moselle, LCSW Phone Number: 04/26/2022, 2:31 PM    Transition of Care Department [facility]) has reviewed patient and no TOC needs have been identified at this time. We will continue to monitor patient advancement through interdisciplinary progression rounds. If new patient transition needs arise, please place a TOC consult.

## 2022-04-26 NOTE — Progress Notes (Signed)
PROGRESS NOTE    Richard [name]  YPP:[id number] DOB: February 16, 1941 DOA: 04/24/2022 PCP: [name], DO    Brief Narrative:   Richard [name] is a 81 y.o. male with past medical history significant for type 2 diabetes mellitus, CKD stage IIIb, CAD, HTN, CHB s/p PPM, chronic combined systolic/diastolic congestive heart failure, B-cell lymphoma, HLD, OSA on CPAP, GERD who presented to [facility] ED with right lower extremity pain, redness and swelling.  Patient reports fell roughly 10 days ago while playing golf.  Since fall, he has still been able to play several rounds of golf but has noted that his knee and left lower extremity has become more red and swollen.  Denies any purulent discharge and no knee joint pain with full range of motion intact.  When he awoke this morning he noted that his bed was soaked.  He contacted his PCP who recommended evaluation in the emergency department.  In the ED, temperature 98.8 F, HR 70, RR 18, BP 128/67, SPO2 94% on room air.  WBC 9.4, hemoglobin 13.9, platelet count 202.  Sodium 137, potassium 4.1, chloride 98, CO2 28, BUN 43, creatinine 2.00.  AST 23, ALT 23, total bilirubin 0.9.  Lactic acid 2.4.  Right tibia/fibula x-ray with soft tissue swelling of the right lower extremity most prominent anteriorly along the tibia and prepatellar soft tissues with adjacent prepatellar skin irregularity, chronic posttraumatic deformity of the tibial diaphysis with adjacent heterotrophic ossification, no acute osseous abnormality.  Patient was started on antibiotics.  Hospital service consulted for further evaluation management of right lower extremity cellulitis.  Assessment & Plan:   Right lower extremity cellulitis Patient presenting with 10-day history of right lower extremity swelling, erythema following fall and sustaining abrasion to right knee. -- Vancomycin, pharmacy consulted for dosing/monitoring -- Cefazolin 2 g IV every 8 hours -- Wound care following -- CBC  daily  Right lower extremity edema Patient on Eliquis at baseline but right lower extremity significantly edematous in comparison to left.  Likely secondary to acute infectious process with cellulitis as above. -- Check venous duplex ultrasound right lower extremity  Lactic acidosis: Resolved Lactic acid 2.4 on admission, likely secondary to infectious process as above versus dehydration. -- LA 2.4>>1.6 -- Continue IV fluids, IV antibiotics  Type 2 diabetes mellitus Hemoglobin A1c 8.1, not optimally controlled.  Home regimen includes glimepiride 2 mg p.o. daily, Farxiga 10 mg p.o. daily. -- Hold glimepiride while inpatient -- Continue Farxiga -- Moderate SSI for coverage -- CBG's before every meal/at bedtime  CAD Hyperlipidemia -- Atorvastatin 80 mg p.o. daily -- Vascepa 2 g p.o. twice daily -- Fenofibrate 160 mg p.o. daily -- Zetia 10 mg p.o. daily  Chronic combined systolic/diastolic congestive heart failure, compensated --Metoprolol succinate 25 mg p.o. daily --Holding home torsemide for now --Strict I's and O's and daily weights  History of a flutter CHB s/p PPM History of aortic stenosis s/p TAVR Stable, continue Eliquis 2.5 mg p.o. twice daily.  Outpatient follow-up with cardiology  Essential hypertension --Amlodipine 5 mg p.o. daily --Metoprolol succinate 25 mg p.o. daily  CKD stage IIIb Creatinine 2.00 on admission.  Baseline 1.7-1.9, stable. --Cr 2.00>1.98>1.84>1.76 -- Holding home torsemide for now -- Avoid nephrotoxins, renally dose all medications -- BMP daily  History of gout -- Allopurinol 450 mg p.o. daily  Peripheral neuropathy -- Lyrica 150 mg p.o. twice daily  GERD: Continue PPI  OSA: Continue nocturnal CPAP   DVT prophylaxis: apixaban (ELIQUIS) tablet 2.5 mg Start: 04/25/22  1000 apixaban (ELIQUIS) tablet 2.5 mg    Code Status: Full Code Family Communication: No family present at bedside  Disposition Plan:  Level of care:  Telemetry Status is: Inpatient Remains inpatient appropriate because: IV antibiotics, right lower extremity ultrasound pending    Consultants:  None  Procedures:  None  Antimicrobials:  Vancomycin 10/25>> Cefazolin 10/25>>   Subjective: Patient seen examined bedside, resting comfortably.  Sitting on edge of bed.  Reports had sweats overnight.  Also concerned about his right lower extremity edema.  Discussed this is likely secondary to his active infection with cellulitis but will check a ultrasound to ensure no DVT; although on Eliquis.  No other specific questions or concerns at this time.  Denies headache, no dizziness, no chest pain, no palpitations, no fever/chills/night sweats, no nausea/vomiting/diarrhea, no focal weakness, no fatigue, no cough/congestion, no paresthesias.  No acute events overnight per nursing staff.  Objective: Vitals:   04/25/22 1703 04/25/22 2106 04/26/22 0129 04/26/22 0603  BP: (!) 115/50 117/72 123/68 125/73  Pulse: 65 80 69 71  Resp: '16 20 20 20  '$ Temp: 100.2 F (37.9 C) 98.4 F (36.9 C) 97.8 F (36.6 C) 98.4 F (36.9 C)  TempSrc: Oral Oral Oral Oral  SpO2: 93% 94% 95% 90%  Weight:      Height:        Intake/Output Summary (Last 24 hours) at 04/26/2022 1315 Last data filed at 04/26/2022 1246 Gross per 24 hour  Intake 240 ml  Output --  Net 240 ml   Filed Weights   04/24/22 1017  Weight: (!) 151 kg    Examination:  Physical Exam: GEN: NAD, alert and oriented x 3, obese HEENT: NCAT, PERRL, EOMI, sclera clear, MMM PULM: CTAB w/o wheezes/crackles, normal respiratory effort, on room air CV: RRR w/o M/G/R GI: abd soft, NTND, NABS, no R/G/M MSK: no peripheral edema, muscle strength globally intact 5/5 bilateral upper/lower extremities NEURO: CN II-XII intact, no focal deficits, sensation to light touch intact PSYCH: normal mood/affect Integumentary: Right knee with scab present, erythema noted to right lower extremity as depicted below;  otherwise no other concerning rashes/lesions/wounds noted on exposed skin surfaces         Data Reviewed: I have personally reviewed following labs and imaging studies  CBC: Recent Labs  Lab 04/24/22 1141 04/25/22 0751 04/26/22 0547  WBC 9.4 8.5 8.2  HGB 13.9 12.5* 12.6*  HCT 44.6 39.4 39.3  MCV 93.5 92.3 91.6  PLT 202 176 814   Basic Metabolic Panel: Recent Labs  Lab 04/24/22 1141 04/25/22 0440 04/25/22 0751 04/26/22 0547  NA 137  --  136 136  K 4.1  --  3.7 3.9  CL 98  --  101 101  CO2 28  --  26 25  GLUCOSE 282*  --  147* 146*  BUN 43*  --  42* 36*  CREATININE 2.00* 1.98* 1.84* 1.76*  CALCIUM 9.8  --  8.9 8.9   GFR: Estimated Creatinine Clearance: 48.1 mL/min (A) (by C-G formula based on SCr of 1.76 mg/dL (H)). Liver Function Tests: Recent Labs  Lab 04/24/22 1141 04/25/22 0751  AST 23 23  ALT 23 21  ALKPHOS 61 49  BILITOT 0.9 1.0  PROT 7.7 7.0  ALBUMIN 4.2 3.5   No results for input(s): "LIPASE", "AMYLASE" in the last 168 hours. No results for input(s): "AMMONIA" in the last 168 hours. Coagulation Profile: No results for input(s): "INR", "PROTIME" in the last 168 hours. Cardiac Enzymes: No results for  input(s): "CKTOTAL", "CKMB", "CKMBINDEX", "TROPONINI" in the last 168 hours. BNP (last 3 results) Recent Labs    11/29/21 1401 01/15/22 1122  PROBNP 5,385* 1,211.0*   HbA1C: Recent Labs    04/25/22 0804  HGBA1C 8.1*   CBG: Recent Labs  Lab 04/25/22 0820 04/25/22 1628 04/25/22 2107 04/26/22 0745 04/26/22 1133  GLUCAP 148* 140* 198* 126* 179*   Lipid Profile: No results for input(s): "CHOL", "HDL", "LDLCALC", "TRIG", "CHOLHDL", "LDLDIRECT" in the last 72 hours. Thyroid Function Tests: No results for input(s): "TSH", "T4TOTAL", "FREET4", "T3FREE", "THYROIDAB" in the last 72 hours. Anemia Panel: No results for input(s): "VITAMINB12", "FOLATE", "FERRITIN", "TIBC", "IRON", "RETICCTPCT" in the last 72 hours. Sepsis Labs: Recent Labs   Lab 04/24/22 1141 04/24/22 1428 04/26/22 0552  LATICACIDVEN 2.4* 2.0* 1.6    Recent Results (from the past 240 hour(s))  Blood culture (routine x 2)     Status: None (Preliminary result)   Collection Time: 04/24/22 11:41 AM   Specimen: BLOOD  Result Value Ref Range Status   Specimen Description   Final    BLOOD BLOOD RIGHT ARM Performed at [facility] [address], [place], [place] [address]    Special Requests   Final    BOTTLES DRAWN AEROBIC AND ANAEROBIC Blood Culture adequate volume Performed at [facility] [address], [place], [place] [address]    Culture   Final    NO GROWTH 2 DAYS Performed at [facility] [address], [place], [place] [address]    Report Status PENDING  Incomplete  Blood culture (routine x 2)     Status: None (Preliminary result)   Collection Time: 04/24/22  2:28 PM   Specimen: BLOOD  Result Value Ref Range Status   Specimen Description   Final    BLOOD SITE NOT SPECIFIED Performed at [facility] [address] [place], [place] [address]    Special Requests   Final    BOTTLES DRAWN AEROBIC AND ANAEROBIC Blood Culture adequate volume Performed at [facility] [address] [place], [place] [address]    Culture   Final    NO GROWTH 2 DAYS Performed at [facility] [address], [place], [place] [address]    Report Status PENDING  Incomplete         Radiology Studies: No results found.      Scheduled Meds:  allopurinol  450 mg Oral Daily   amLODipine  5 mg Oral Daily   apixaban  2.5 mg Oral BID   atorvastatin  80 mg Oral QHS   dapagliflozin propanediol  10 mg Oral Daily   ezetimibe  10 mg Oral Daily   fenofibrate  160 mg Oral Daily   folic acid  2 mg Oral q AM   icosapent Ethyl  2 g Oral BID   insulin aspart  0-15 Units Subcutaneous TID WC   insulin aspart  0-5 Units Subcutaneous QHS   loratadine  10 mg Oral Daily   metoprolol  succinate  25 mg Oral Daily   pantoprazole  80 mg Oral Q1200   pregabalin  150 mg Oral BID   Continuous Infusions:  sodium chloride 75 mL/hr at 04/26/22 0858    ceFAZolin (ANCEF) IV 2 g (04/26/22 0859)   vancomycin 1,000 mg (04/25/22 1759)     LOS: 2 days    Time spent: 48 minutes spent on chart review, discussion with nursing staff, consultants, updating family  and interview/physical exam; more than 50% of that time was spent in counseling and/or coordination of care.    Daylah Sayavong J [place] DO [facility] Available via Epic secure chat 7am-7pm After these hours, please refer to coverage provider listed on [email or web address] 04/26/2022, 1:15 PM

## 2022-04-27 ENCOUNTER — Encounter: Payer: Self-pay | Admitting: Family

## 2022-04-27 ENCOUNTER — Other Ambulatory Visit (HOSPITAL_COMMUNITY): Payer: Self-pay

## 2022-04-27 DIAGNOSIS — L03115 Cellulitis of right lower limb: Secondary | ICD-10-CM | POA: Diagnosis not present

## 2022-04-27 LAB — BASIC METABOLIC PANEL
Anion gap: 9 (ref 5–15)
BUN: 30 mg/dL — ABNORMAL HIGH (ref 8–23)
CO2: 21 mmol/L — ABNORMAL LOW (ref 22–32)
Calcium: 8.3 mg/dL — ABNORMAL LOW (ref 8.9–10.3)
Chloride: 106 mmol/L (ref 98–111)
Creatinine, Ser: 1.4 mg/dL — ABNORMAL HIGH (ref 0.61–1.24)
GFR, Estimated: 50 mL/min — ABNORMAL LOW (ref >60)
Glucose, Bld: 133 mg/dL — ABNORMAL HIGH (ref 70–99)
Potassium: 3.4 mmol/L — ABNORMAL LOW (ref 3.5–5.1)
Sodium: 136 mmol/L (ref 135–145)

## 2022-04-27 LAB — CBC
HCT: 35.1 % — ABNORMAL LOW (ref 39.0–52.0)
Hemoglobin: 11.3 g/dL — ABNORMAL LOW (ref 13.0–17.0)
MCH: 29.1 pg (ref 26.0–34.0)
MCHC: 32.2 g/dL (ref 30.0–36.0)
MCV: 90.5 fL (ref 80.0–100.0)
Platelets: 141 10*3/uL — ABNORMAL LOW (ref 150–400)
RBC: 3.88 MIL/uL — ABNORMAL LOW (ref 4.22–5.81)
RDW: 16.9 % — ABNORMAL HIGH (ref 11.5–15.5)
WBC: 7.9 10*3/uL (ref 4.0–10.5)
nRBC: 0 % (ref 0.0–0.2)

## 2022-04-27 LAB — GLUCOSE, CAPILLARY: Glucose-Capillary: 152 mg/dL — ABNORMAL HIGH (ref 70–99)

## 2022-04-27 MED ORDER — CEPHALEXIN 500 MG PO CAPS
500.0000 mg | ORAL_CAPSULE | Freq: Four times a day (QID) | ORAL | 0 refills | Status: AC
  Filled 2022-04-27: qty 40, 10d supply, fill #0

## 2022-04-27 MED ORDER — OXYCODONE HCL 5 MG PO TABS
5.0000 mg | ORAL_TABLET | Freq: Four times a day (QID) | ORAL | 0 refills | Status: DC | PRN
  Filled 2022-04-27: qty 20, 5d supply, fill #0

## 2022-04-27 MED ORDER — POTASSIUM CHLORIDE CRYS ER 20 MEQ PO TBCR
40.0000 meq | EXTENDED_RELEASE_TABLET | Freq: Once | ORAL | Status: AC
  Administered 2022-04-27: 40 meq via ORAL
  Filled 2022-04-27: qty 2

## 2022-04-27 MED ORDER — VANCOMYCIN HCL 1500 MG/300ML IV SOLN: 1500.0000 mg | INTRAVENOUS | Status: DC

## 2022-04-27 MED ORDER — LINEZOLID 600 MG PO TABS
600.0000 mg | ORAL_TABLET | Freq: Two times a day (BID) | ORAL | 0 refills | Status: AC
  Filled 2022-04-27: qty 20, 10d supply, fill #0

## 2022-04-27 NOTE — Progress Notes (Signed)
I attest to student documentation.  Shona Pardo, MSN-RN, CFPN Nursing Adjunct Faculty [employer] 

## 2022-04-27 NOTE — Progress Notes (Signed)
Pharmacy Antibiotic Note  Richard [name] is a 81 y.o. male admitted on 04/24/2022 with cellulitis.  Pt is on cefazolin + vancomycin. Pharmacy has been consulted for vancomycin dosing.   Today, 04/27/22 WBC WNL SCr trending down, now 1.4. CrCl 60 mL/min Afebrile  Today is day #4 of IV antibiotics  Plan: Increase vancomycin to 1500 mg IV q24h given improvement in renal function for estimated AUC of 486 Continue to monitor renal function. Check AUC at steady state as needed Cefazolin 2 g IV q8h through 11/1   Height: 6' (182.9 cm) Weight: (!) 151 kg (333 lb) IBW/kg (Calculated) : 77.6  Temp (24hrs), Avg:98.2 F (36.8 C), Min:97.7 F (36.5 C), Max:99 F (37.2 C)  Recent Labs  Lab 04/24/22 1141 04/24/22 1428 04/25/22 0440 04/25/22 0751 04/26/22 0547 04/26/22 0552 04/27/22 0545  WBC 9.4  --   --  8.5 8.2  --  7.9  CREATININE 2.00*  --  1.98* 1.84* 1.76*  --  1.40*  LATICACIDVEN 2.4* 2.0*  --   --   --  1.6  --      Estimated Creatinine Clearance: 60.5 mL/min (A) (by C-G formula based on SCr of 1.4 mg/dL (H)).    Allergies  Allergen Reactions   Ace Inhibitors Swelling and Other (See Comments)    Angioedema    Benazepril Swelling and Other (See Comments)    Angioedema; he is not a candidate for any angiotensin receptor blockers because of this significant allergic reaction. Because of a history of documented adverse serious drug reaction;Medi Alert bracelet  is recommended   Entresto [Sacubitril-Valsartan] Swelling and Other (See Comments)    First-in-Class Angiotensin Receptor Neprilysin Inhibitor- Med was "red-flagged" by the patient's pharmacy for him to NOT take!   Hctz [Hydrochlorothiazide] Anaphylaxis and Swelling    Tongue and lip swelling    Aspirin Other (See Comments)    Gastritis, can aspirin not take 325 mg aspirin    Antimicrobials this admission:  cefazolin 10/25 >> (11/1)  vancomycin 10/25 >>   Dose adjustments this admission:  10/28: Vanc 1 g  q24 >> 1500 mg IV q24h  Microbiology results:  10/25 BCx: ngtd  Lenis Noon, PharmD 04/27/2022 8:53 AM

## 2022-04-27 NOTE — Discharge Summary (Signed)
Physician Discharge Summary  Richard [name] PJA:[id number] DOB: October 16, 1940 DOA: 04/24/2022  PCP: [name], DO  Admit date: 04/24/2022 Discharge date: 04/27/2022  Admitted From: Home Disposition: Home  Recommendations for Outpatient Follow-up:  Follow up with PCP in 1-2 weeks Follow-up with wound care clinic Continue Zyvox and Keflex to complete antibiotic course for right lower extremity cellulitis  Home Health: No Equipment/Devices: None  Discharge Condition: Stable CODE STATUS: Full code Diet recommendation: Heart healthy/consistent carb regular diet  History of present illness:  Richard [name] is a 81 y.o. male with past medical history significant for type 2 diabetes mellitus, CKD stage IIIb, CAD, HTN, CHB s/p PPM, chronic combined systolic/diastolic congestive heart failure, B-cell lymphoma, HLD, OSA on CPAP, GERD who presented to [facility] ED with right lower extremity pain, redness and swelling.  Patient reports fell roughly 10 days ago while playing golf.  Since fall, he has still been able to play several rounds of golf but has noted that his knee and left lower extremity has become more red and swollen.  Denies any purulent discharge and no knee joint pain with full range of motion intact.  When he awoke this morning he noted that his bed was soaked.  He contacted his PCP who recommended evaluation in the emergency department.   In the ED, temperature 98.8 F, HR 70, RR 18, BP 128/67, SPO2 94% on room air.  WBC 9.4, hemoglobin 13.9, platelet count 202.  Sodium 137, potassium 4.1, chloride 98, CO2 28, BUN 43, creatinine 2.00.  AST 23, ALT 23, total bilirubin 0.9.  Lactic acid 2.4.  Right tibia/fibula x-ray with soft tissue swelling of the right lower extremity most prominent anteriorly along the tibia and prepatellar soft tissues with adjacent prepatellar skin irregularity, chronic posttraumatic deformity of the tibial diaphysis with adjacent heterotrophic ossification, no  acute osseous abnormality.  Patient was started on antibiotics.  Hospital service consulted for further evaluation management of right lower extremity cellulitis.  Hospital course:  Right lower extremity cellulitis Patient presenting with 10-day history of right lower extremity swelling, erythema following fall and sustaining abrasion to right knee.  Patient was started on vancomycin and cefazolin.  Right lower extremity duplex ultrasound negative for DVT.  Continue Keflex and Zyvox on discharge to complete antibiotic course.  Patient has established care with the wound care clinic outpatient and instructed to make a follow-up appointment with them in 1-2 weeks.  Follow-up with PCP as scheduled next week.   Right lower extremity edema Patient on Eliquis at baseline but right lower extremity significantly edematous in comparison to left.  Likely secondary to acute infectious process with cellulitis as above.  Venous duplex ultrasound right lower extremity negative for DVT.  Continue leg elevation.   Lactic acidosis: Resolved Lactic acid 2.4 on admission, likely secondary to infectious process as above versus dehydration.  Resolved with IV fluids and IV antibiotics.  Lactic acid down to 1.6.   Type 2 diabetes mellitus Hemoglobin A1c 8.1, not optimally controlled.  Home regimen includes glimepiride 2 mg p.o. daily, Farxiga 10 mg p.o. daily.  Outpatient follow-up PCP.   CAD Hyperlipidemia Continue atorvastatin 80 mg p.o. daily, Vascepa 2 g p.o. twice daily, Fenofibrate 160 mg p.o. daily, Zetia 10 mg p.o. daily   Chronic combined systolic/diastolic congestive heart failure, compensated Continue Metoprolol succinate 25 mg p.o. daily and torsemide   History of a flutter CHB s/p PPM History of aortic stenosis s/p TAVR Stable, continue Eliquis 2.5 mg p.o. twice  daily.  Outpatient follow-up with cardiology   Essential hypertension Continue amlodipine 5 mg p.o. daily and Metoprolol succinate 25 mg  p.o. daily   CKD stage IIIb Creatinine 2.00 on admission.  Baseline 1.7-1.9, stable.  Creatinine 1.40 at time of discharge.   History of gout Allopurinol 450 mg p.o. daily   Peripheral neuropathy Lyrica 150 mg p.o. twice daily   GERD: Continue PPI   OSA: Continue nocturnal CPAP  Morbid obesity Body mass index is 45.16 kg/m. Discussed with patient needs for aggressive lifestyle changes/weight loss as this complicates all facets of care.  Outpatient follow-up with PCP.     Discharge Diagnoses:  Principal Problem:   Cellulitis of right leg Active Problems:   GOUT   Essential hypertension   Coronary artery disease   Typical atrial flutter (HCC)   Complete heart block (HCC)   DM2 (diabetes mellitus, type 2) (HCC)   Hyperlipidemia associated with type 2 diabetes mellitus (HCC)   Chronic combined systolic and diastolic heart failure (HCC)   Stage 3b chronic kidney disease (CKD) (HCC)   Lactic acidosis    Discharge Instructions  Discharge Instructions     Call MD for:  difficulty breathing, headache or visual disturbances   Complete by: As directed    Call MD for:  extreme fatigue   Complete by: As directed    Call MD for:  persistant dizziness or light-headedness   Complete by: As directed    Call MD for:  persistant nausea and vomiting   Complete by: As directed    Call MD for:  severe uncontrolled pain   Complete by: As directed    Call MD for:  temperature >100.4   Complete by: As directed    Diet - low sodium heart healthy   Complete by: As directed    Discharge wound care:   Complete by: As directed    Wound care to right knee area of dried serum/scab:  Cleanse with normal saline. Pat dry. Cover with povidone iodine (betadine) gauze.  Top with dry gauze and secure with a few turns of Kerlix roll gauze/paper tape. Change/Perform twice daily.   Increase activity slowly   Complete by: As directed       Allergies as of 04/27/2022       Reactions   Ace  Inhibitors Swelling, Other (See Comments)   Angioedema   Benazepril Swelling, Other (See Comments)   Angioedema; he is not a candidate for any angiotensin receptor blockers because of this significant allergic reaction. Because of a history of documented adverse serious drug reaction;Medi Alert bracelet  is recommended   Entresto [sacubitril-valsartan] Swelling, Other (See Comments)   First-in-Class Angiotensin Receptor Neprilysin Inhibitor- Med was "red-flagged" by the patient's pharmacy for him to NOT take!   Hctz [hydrochlorothiazide] Anaphylaxis, Swelling   Tongue and lip swelling   Aspirin Other (See Comments)   Gastritis, can aspirin not take 325 mg aspirin        Medication List     TAKE these medications    acetaminophen 325 MG tablet Commonly known as: TYLENOL Take 650 mg by mouth at bedtime as needed for moderate pain or headache.   allopurinol 300 MG tablet Commonly known as: ZYLOPRIM Take 450 mg by mouth daily.   aluminum hydroxide-magnesium carbonate 95-358 MG/15ML Susp Commonly known as: GAVISCON Take 15 mLs by mouth as needed for indigestion or heartburn.   amLODipine 5 MG tablet Commonly known as: NORVASC Take 5 mg by mouth daily.  apixaban 2.5 MG Tabs tablet Commonly known as: ELIQUIS Take 1 tablet (2.5 mg total) by mouth 2 (two) times daily.   atorvastatin 80 MG tablet Commonly known as: LIPITOR TAKE 1 TABLET BY MOUTH EVERYDAY AT BEDTIME What changed: See the new instructions.   azelastine 0.1 % nasal spray Commonly known as: ASTELIN PLACE 2 SPRAYS INTO BOTH NOSTRILS AT BEDTIME AS NEEDED FOR RHINITIS OR ALLERGIES.   cephALEXin 500 MG capsule Commonly known as: KEFLEX Take 1 capsule (500 mg total) by mouth 4 (four) times daily for 10 days.   CO Q-10 PO Take 1 capsule by mouth daily.   esomeprazole 40 MG capsule Commonly known as: NexIUM Take 1 capsule (40 mg total) by mouth daily. What changed: when to take this   ezetimibe 10 MG  tablet Commonly known as: ZETIA TAKE 1 TABLET BY MOUTH EVERY DAY   Farxiga 10 MG Tabs tablet Generic drug: dapagliflozin propanediol Take 10 mg by mouth in the morning.   fenofibrate 160 MG tablet TAKE 1 TABLET BY MOUTH EVERY DAY   folic acid 1 MG tablet Commonly known as: FOLVITE TAKE 2 TABLETS BY MOUTH EVERY DAY What changed: when to take this   freestyle lancets USE ONCE A DAY TO CHECK BLOOD SUGAR.   FREESTYLE LITE test strip Generic drug: glucose blood USE TO TEST BLOOD SUGAR ONCE A DAY. DX CODE: E11.9   glimepiride 1 MG tablet Commonly known as: AMARYL Take 2 mg by mouth every morning.   icosapent Ethyl 1 g capsule Commonly known as: VASCEPA TAKE 2 CAPSULES BY MOUTH TWICE A DAY What changed: when to take this   ICY HOT ADVANCED PAIN RELIEF EX Apply 1 application  topically daily as needed (to painful sites).   linezolid 600 MG tablet Commonly known as: Zyvox Take 1 tablet (600 mg total) by mouth 2 (two) times daily for 10 days.   metoprolol succinate 25 MG 24 hr tablet Commonly known as: TOPROL-XL Take 1 tablet (25 mg total) by mouth daily.   nitroGLYCERIN 0.4 MG SL tablet Commonly known as: NITROSTAT PLACE 1 TABLET (0.4 MG TOTAL) UNDER THE TONGUE EVERY 5 (FIVE) MINUTES AS NEEDED FOR CHEST PAIN.   oxyCODONE 5 MG immediate release tablet Commonly known as: Oxy IR/ROXICODONE Take 1 tablet (5 mg total) by mouth every 6 (six) hours as needed for moderate pain.   potassium chloride SA 20 MEQ tablet Commonly known as: Klor-Con M20 Take 2 tablets (40 mEq total) by mouth daily.   pregabalin 150 MG capsule Commonly known as: LYRICA TAKE 1 CAPSULE BY MOUTH TWICE A DAY   psyllium 58.6 % powder Commonly known as: METAMUCIL Take 1 packet by mouth daily as needed (for constipation- mix and drink).   senna 8.6 MG Tabs tablet Commonly known as: SENOKOT Take 2 tablets (17.2 mg total) by mouth at bedtime. This is for constipation.  Stop taking if you start having  diarrhea. What changed:  when to take this reasons to take this   torsemide 20 MG tablet Commonly known as: DEMADEX Take 2 tablets (40 mg total) by mouth in the morning. Do not take on 01/24/2022.   VITAMIN C PO Take 1 tablet by mouth daily with breakfast.               Discharge Care Instructions  (From admission, onward)           Start     Ordered   04/27/22 0000  Discharge wound care:  Comments: Wound care to right knee area of dried serum/scab:  Cleanse with normal saline. Pat dry. Cover with povidone iodine (betadine) gauze.  Top with dry gauze and secure with a few turns of Kerlix roll gauze/paper tape. Change/Perform twice daily.   04/27/22 6295            Follow-up Information     [name], DO. Schedule an appointment as soon as possible for a visit.   Specialty: Family Medicine Contact information: [phone] [place] [place] [address] [phone]         [facility]             . Schedule an appointment as soon as possible for a visit.   Contact information: [address] [address] [phone]               Allergies  Allergen Reactions   Ace Inhibitors Swelling and Other (See Comments)    Angioedema    Benazepril Swelling and Other (See Comments)    Angioedema; he is not a candidate for any angiotensin receptor blockers because of this significant allergic reaction. Because of a history of documented adverse serious drug reaction;Medi Alert bracelet  is recommended   Entresto [Sacubitril-Valsartan] Swelling and Other (See Comments)    First-in-Class Angiotensin Receptor Neprilysin Inhibitor- Med was "red-flagged" by the patient's pharmacy for him to NOT take!   Hctz [Hydrochlorothiazide] Anaphylaxis and Swelling    Tongue and lip swelling    Aspirin Other (See Comments)    Gastritis, can aspirin not take 325 mg aspirin     Consultations: None   Procedures/Studies: VAS [place] LOWER EXTREMITY VENOUS (DVT)  Result Date: 04/26/2022  Lower Venous DVT Study Patient Name:  Richard [name]  Date of Exam:   04/26/2022 Medical Rec #: [id number]       Accession #:    [id number] Date of Birth: 12-24-1940       Patient Gender: M Patient Age:   54 years Exam Location:  [facility] Procedure:      VAS [place] LOWER EXTREMITY VENOUS (DVT) Referring Phys: Braley Luckenbaugh [place] --------------------------------------------------------------------------------  Indications: Warmth, erythema, swelling of right leg. Cellulitis.  Anticoagulation: Eliquis. Comparison Study: 01-16-2022 Prior right lower extremity venous study was                   negative for DVT. Performing Technologist: Darlin Coco RDMS, RVT  Examination Guidelines: A complete evaluation includes B-mode imaging, spectral Doppler, color Doppler, and power Doppler as needed of all accessible portions of each vessel. Bilateral testing is considered an integral part of a complete examination. Limited examinations for reoccurring indications may be performed as noted. The reflux portion of the exam is performed with the patient in reverse Trendelenburg.  +---------+---------------+---------+-----------+----------+--------------+ RIGHT    CompressibilityPhasicitySpontaneityPropertiesThrombus Aging +---------+---------------+---------+-----------+----------+--------------+ CFV      Full           Yes      Yes                                 +---------+---------------+---------+-----------+----------+--------------+ SFJ      Full                                                        +---------+---------------+---------+-----------+----------+--------------+  FV Prox  Full                                                        +---------+---------------+---------+-----------+----------+--------------+ FV Mid   Full                                                         +---------+---------------+---------+-----------+----------+--------------+ FV DistalFull                                                        +---------+---------------+---------+-----------+----------+--------------+ PFV      Full                                                        +---------+---------------+---------+-----------+----------+--------------+ POP      Full           Yes      Yes                                 +---------+---------------+---------+-----------+----------+--------------+ PTV      Full                                                        +---------+---------------+---------+-----------+----------+--------------+ PERO     Full                                                        +---------+---------------+---------+-----------+----------+--------------+ Gastroc  Full                                                        +---------+---------------+---------+-----------+----------+--------------+   +----+---------------+---------+-----------+----------+--------------+ LEFTCompressibilityPhasicitySpontaneityPropertiesThrombus Aging +----+---------------+---------+-----------+----------+--------------+ CFV Full           Yes      Yes                                 +----+---------------+---------+-----------+----------+--------------+    Summary: RIGHT: - There is no evidence of deep vein thrombosis in the lower extremity.  - No cystic structure found in the popliteal fossa.  LEFT: - No evidence of common femoral vein obstruction.  *See table(s) above for measurements and observations.    Preliminary    DG Tibia/Fibula Right  Result  Date: 04/24/2022 CLINICAL DATA:  Fall, pain EXAM: RIGHT TIBIA AND FIBULA - 2 VIEW COMPARISON:  None Available. FINDINGS: There is no evidence of acute fracture. Alignment is normal. Chronic post traumatic deformity of the tibial diaphysis. Adjacent heterotopic ossifiction at the  interosseous membrane/medial fibular cortex. Mild track a osteoarthritis of the knee. There is soft tissue swelling of the lower extremity most prominent anteriorly along the tibia and prepatellar soft tissues. There is prepatellar skin irregularity. There are postsurgical changes overlying the medial soft tissues and vascular calcifications. IMPRESSION: Soft tissue swelling of the right lower extremity most prominent anteriorly along the tibia and the prepatellar soft tissues and with adjacent prepatellar skin irregularity. Chronic posttraumatic deformity of the tibial diaphysis with adjacent heterotopic ossification. No acute osseous abnormality. Electronically Signed   By: Maurine Simmering M.D.   On: 04/24/2022 12:02   DG Abd 2 Views  Result Date: 04/08/2022 CLINICAL DATA:  LEFT upper quadrant discomfort, belching. Hiatal hernia?[name] EXAM: ABDOMEN - 2 VIEW COMPARISON:  Chest CT dated 10/24/2020. FINDINGS: No dilated large or small bowel loops. No evidence of soft tissue mass or abnormal fluid collection. No evidence of free intraperitoneal air. No evidence of renal or ureteral calculi. No evidence of hiatal hernia, and no hiatal hernia was present at the time of patient's chest CT of 10/24/2020. No acute or suspicious osseous abnormality. IMPRESSION: No acute findings. Nonobstructive bowel gas pattern. No evidence of hiatal hernia, and no hiatal hernia was present at the time of patient's chest CT of 10/24/2020. Electronically Signed   By: Franki Cabot M.D.   On: 04/08/2022 17:53   DG Chest 2 View  Result Date: 04/08/2022 CLINICAL DATA:  Cough.  Rule out pneumonia. EXAM: CHEST - 2 VIEW COMPARISON:  Chest x-ray dated 01/16/2022. Chest CT dated 10/24/2020. FINDINGS: Borderline cardiomegaly, stable. Median sternotomy wires appear intact and stable in alignment. Stable mild interstitial prominence bilaterally, lower lobe predominant. No evidence of a superimposed pneumonia. No pleural effusion or pneumothorax is  seen. No acute-appearing osseous abnormality. IMPRESSION: 1. No active cardiopulmonary disease. No evidence of pneumonia or pulmonary edema. 2. Probable chronic interstitial lung disease and/or chronic bronchitic changes within the lower lobes. Electronically Signed   By: Franki Cabot M.D.   On: 04/08/2022 17:48     Subjective: Patient seen examined bedside, resting comfortably.  Sitting at edge of bed eating breakfast.  No specific complaints this morning.  Discussed negative ultrasound of his right lower extremity for DVT.  Ready for discharge home.  No other specific questions or concerns at this time.  Denies headache, no dizziness, no chest pain, no palpitations, no fever/chills/night sweats, no nausea/vomiting/diarrhea, no focal weakness, no fatigue, no cough/congestion, no paresthesias.  No acute events overnight per nursing staff.  Discharge Exam: Vitals:   04/27/22 0616 04/27/22 0907  BP: 125/64 120/62  Pulse: 80 76  Resp: 18 16  Temp: 99 F (37.2 C) 97.6 F (36.4 C)  SpO2: 94% 95%   Vitals:   04/26/22 1321 04/26/22 2021 04/27/22 0616 04/27/22 0907  BP: 127/67 107/69 125/64 120/62  Pulse: 79 71 80 76  Resp: '16 20 18 16  '$ Temp: 97.7 F (36.5 C) 98 F (36.7 C) 99 F (37.2 C) 97.6 F (36.4 C)  TempSrc: Oral Oral Oral Oral  SpO2: 96% 98% 94% 95%  Weight:      Height:        Physical Exam: GEN: NAD, alert and oriented x 3, obese HEENT: NCAT, PERRL, EOMI, sclera clear,  MMM PULM: CTAB w/o wheezes/crackles, normal respiratory effort, on room air CV: RRR w/o M/G/R GI: abd soft, NTND, NABS, no R/G/M MSK: Asymmetric edema right greater than left, 1-2+ peripheral edema, muscle strength globally intact 5/5 bilateral upper/lower extremities NEURO: CN II-XII intact, no focal deficits, sensation to light touch intact PSYCH: normal mood/affect Integumentary: Right knee with scab present, evolving erythema no distal right lower extremity with no further  progression          The results of significant diagnostics from this hospitalization (including imaging, microbiology, ancillary and laboratory) are listed below for reference.     Microbiology: Recent Results (from the past 240 hour(s))  Blood culture (routine x 2)     Status: None (Preliminary result)   Collection Time: 04/24/22 11:41 AM   Specimen: BLOOD  Result Value Ref Range Status   Specimen Description   Final    BLOOD BLOOD RIGHT ARM Performed at [facility] [address], [place], [place] [address]    Special Requests   Final    BOTTLES DRAWN AEROBIC AND ANAEROBIC Blood Culture adequate volume Performed at [facility] [address] [place], [place] [address]    Culture   Final    NO GROWTH 2 DAYS Performed at [facility] [address], [place], [place] [address]    Report Status PENDING  Incomplete  Blood culture (routine x 2)     Status: None (Preliminary result)   Collection Time: 04/24/22  2:28 PM   Specimen: BLOOD  Result Value Ref Range Status   Specimen Description   Final    BLOOD SITE NOT SPECIFIED Performed at [facility] [address] [place], [place] [address]    Special Requests   Final    BOTTLES DRAWN AEROBIC AND ANAEROBIC Blood Culture adequate volume Performed at [facility] [address] [place], [place] [address]    Culture   Final    NO GROWTH 2 DAYS Performed at [facility] [address], [place], [place] [address]    Report Status PENDING  Incomplete     Labs: BNP (last 3 results) Recent Labs    01/16/22 1202 01/17/22 0514  BNP 992.0* 852.7*   Basic Metabolic Panel: Recent Labs  Lab 04/24/22 1141 04/25/22 0440 04/25/22 0751 04/26/22 0547 04/27/22 0545  NA 137  --  136 136 136  K 4.1  --  3.7 3.9 3.4*  CL 98  --  101 101 106  CO2 28  --  26 25 21*  GLUCOSE 282*  --  147* 146* 133*  BUN 43*  --  42* 36* 30*  CREATININE  2.00* 1.98* 1.84* 1.76* 1.40*  CALCIUM 9.8  --  8.9 8.9 8.3*   Liver Function Tests: Recent Labs  Lab 04/24/22 1141 04/25/22 0751  AST 23 23  ALT 23 21  ALKPHOS 61 49  BILITOT 0.9 1.0  PROT 7.7 7.0  ALBUMIN 4.2 3.5   No results for input(s): "LIPASE", "AMYLASE" in the last 168 hours. No results for input(s): "AMMONIA" in the last 168 hours. CBC: Recent Labs  Lab 04/24/22 1141 04/25/22 0751 04/26/22 0547 04/27/22 0545  WBC 9.4 8.5 8.2 7.9  HGB 13.9 12.5* 12.6* 11.3*  HCT 44.6 39.4 39.3 35.1*  MCV 93.5 92.3 91.6 90.5  PLT 202 176 165 141*   Cardiac Enzymes: No results for input(s): "CKTOTAL", "CKMB", "CKMBINDEX", "TROPONINI" in the last 168 hours. BNP: Invalid  input(s): "POCBNP" CBG: Recent Labs  Lab 04/26/22 0745 04/26/22 1133 04/26/22 1643 04/26/22 2151 04/27/22 0758  GLUCAP 126* 179* 172* 153* 152*   D-Dimer No results for input(s): "DDIMER" in the last 72 hours. Hgb A1c Recent Labs    04/25/22 0804  HGBA1C 8.1*   Lipid Profile No results for input(s): "CHOL", "HDL", "LDLCALC", "TRIG", "CHOLHDL", "LDLDIRECT" in the last 72 hours. Thyroid function studies No results for input(s): "TSH", "T4TOTAL", "T3FREE", "THYROIDAB" in the last 72 hours.  Invalid input(s): "FREET3" Anemia work up No results for input(s): "VITAMINB12", "FOLATE", "FERRITIN", "TIBC", "IRON", "RETICCTPCT" in the last 72 hours. Urinalysis    Component Value Date/Time   COLORURINE YELLOW 09/05/2017 1400   APPEARANCEUR CLEAR 09/05/2017 1400   LABSPEC 1.010 09/05/2017 1400   PHURINE 5.0 09/05/2017 1400   GLUCOSEU NEGATIVE 09/05/2017 1400   HGBUR NEGATIVE 09/05/2017 1400   HGBUR negative 05/29/2010 1100   BILIRUBINUR negative 08/10/2019 1343   KETONESUR NEGATIVE 09/05/2017 1400   PROTEINUR Negative 08/10/2019 1343   PROTEINUR NEGATIVE 09/05/2017 1400   UROBILINOGEN 0.2 08/10/2019 1343   UROBILINOGEN 0.2 10/06/2014 1010   NITRITE negative 08/10/2019 1343   NITRITE NEGATIVE  09/05/2017 1400   LEUKOCYTESUR Negative 08/10/2019 1343   Sepsis Labs Recent Labs  Lab 04/24/22 1141 04/25/22 0751 04/26/22 0547 04/27/22 0545  WBC 9.4 8.5 8.2 7.9   Microbiology Recent Results (from the past 240 hour(s))  Blood culture (routine x 2)     Status: None (Preliminary result)   Collection Time: 04/24/22 11:41 AM   Specimen: BLOOD  Result Value Ref Range Status   Specimen Description   Final    BLOOD BLOOD RIGHT ARM Performed at [facility], [place] [address] [place], [place] [address]    Special Requests   Final    BOTTLES DRAWN AEROBIC AND ANAEROBIC Blood Culture adequate volume Performed at [facility] [address], [place], [place] [address]    Culture   Final    NO GROWTH 2 DAYS Performed at [facility] [address], [place], [place] [address]    Report Status PENDING  Incomplete  Blood culture (routine x 2)     Status: None (Preliminary result)   Collection Time: 04/24/22  2:28 PM   Specimen: BLOOD  Result Value Ref Range Status   Specimen Description   Final    BLOOD SITE NOT SPECIFIED Performed at [facility] [address], [place], [place] [address]    Special Requests   Final    BOTTLES DRAWN AEROBIC AND ANAEROBIC Blood Culture adequate volume Performed at [facility] [address] [place], [place] [address]    Culture   Final    NO GROWTH 2 DAYS Performed at [facility] [address], [place], [place] [address]    Report Status PENDING  Incomplete     Time coordinating discharge: Over 30 minutes  SIGNED:   Daquane Aguilar J [place] DO  [facility] 04/27/2022, 9:52 AM

## 2022-04-29 ENCOUNTER — Telehealth: Payer: Self-pay

## 2022-04-29 LAB — CULTURE, BLOOD (ROUTINE X 2)
Culture: NO GROWTH
Culture: NO GROWTH
Special Requests: ADEQUATE
Special Requests: ADEQUATE

## 2022-04-29 NOTE — Telephone Encounter (Signed)
Transition Care Management Follow-up Telephone Call Date of discharge and from where: [facility] 04-27-22 [id number] right leg  How have you been since you were released from the hospital? Feeling better but leg is swollen really bad  Any questions or concerns? Yes- pt needs appt with wound clinic   Items Reviewed: Did the pt receive and understand the discharge instructions provided? Yes  Medications obtained and verified? Yes  Other? no Any new allergies since your discharge? No  Dietary orders reviewed? Yes Do you have support at home? Yes   Home Care and Equipment/Supplies: Were home health services ordered? no If so, what is the name of the agency? na  Has the agency set up a time to come to the patient's home? not applicable Were any new equipment or medical supplies ordered?  No What is the name of the medical supply agency? na Were you able to get the supplies/equipment? not applicable Do you have any questions related to the use of the equipment or supplies? No  Functional Questionnaire: (I = Independent and D = Dependent) ADLs: I  Bathing/Dressing- I  Meal Prep- I  Eating- I  Maintaining continence- I  Transferring/Ambulation- I  Managing Meds- I  Follow up appointments reviewed:  PCP Hospital f/u appt confirmed? Yes  Scheduled to see Dr Carollee Herter on 04-30-22 @ [facility] f/u appt confirmed? No . Are transportation arrangements needed? No  If their condition worsens, is the pt aware to call PCP or go to the Emergency Dept.? Yes Was the patient provided with contact information for the PCP's office or ED? Yes Was to pt encouraged to call back with questions or concerns? Yes   Juanda Crumble LPN [facility] Direct Dial [phone]

## 2022-04-30 ENCOUNTER — Ambulatory Visit (INDEPENDENT_AMBULATORY_CARE_PROVIDER_SITE_OTHER): Payer: Medicare Other | Admitting: Family Medicine

## 2022-04-30 ENCOUNTER — Encounter: Payer: Self-pay | Admitting: Family Medicine

## 2022-04-30 VITALS — BP 138/88 | HR 77 | Temp 98.5°F | Resp 20 | Ht 72.0 in

## 2022-04-30 DIAGNOSIS — E1169 Type 2 diabetes mellitus with other specified complication: Secondary | ICD-10-CM

## 2022-04-30 DIAGNOSIS — I129 Hypertensive chronic kidney disease with stage 1 through stage 4 chronic kidney disease, or unspecified chronic kidney disease: Secondary | ICD-10-CM

## 2022-04-30 DIAGNOSIS — L03119 Cellulitis of unspecified part of limb: Secondary | ICD-10-CM | POA: Diagnosis not present

## 2022-04-30 DIAGNOSIS — I10 Essential (primary) hypertension: Secondary | ICD-10-CM | POA: Diagnosis not present

## 2022-04-30 DIAGNOSIS — E1122 Type 2 diabetes mellitus with diabetic chronic kidney disease: Secondary | ICD-10-CM

## 2022-04-30 DIAGNOSIS — N183 Chronic kidney disease, stage 3 unspecified: Secondary | ICD-10-CM | POA: Diagnosis not present

## 2022-04-30 DIAGNOSIS — E785 Hyperlipidemia, unspecified: Secondary | ICD-10-CM

## 2022-04-30 DIAGNOSIS — E119 Type 2 diabetes mellitus without complications: Secondary | ICD-10-CM | POA: Insufficient documentation

## 2022-04-30 LAB — CBC WITH DIFFERENTIAL/PLATELET
Basophils Absolute: 0 10*3/uL (ref 0.0–0.1)
Basophils Relative: 0.3 % (ref 0.0–3.0)
Eosinophils Absolute: 0.2 10*3/uL (ref 0.0–0.7)
Eosinophils Relative: 2.3 % (ref 0.0–5.0)
HCT: 38.1 % — ABNORMAL LOW (ref 39.0–52.0)
Hemoglobin: 12.4 g/dL — ABNORMAL LOW (ref 13.0–17.0)
Lymphocytes Relative: 14 % (ref 12.0–46.0)
Lymphs Abs: 1.3 10*3/uL (ref 0.7–4.0)
MCHC: 32.6 g/dL (ref 30.0–36.0)
MCV: 89 fl (ref 78.0–100.0)
Monocytes Absolute: 0.6 10*3/uL (ref 0.1–1.0)
Monocytes Relative: 6.5 % (ref 3.0–12.0)
Neutro Abs: 7.1 10*3/uL (ref 1.4–7.7)
Neutrophils Relative %: 76.9 % (ref 43.0–77.0)
Platelets: 224 10*3/uL (ref 150.0–400.0)
RBC: 4.28 Mil/uL (ref 4.22–5.81)
RDW: 18.2 % — ABNORMAL HIGH (ref 11.5–15.5)
WBC: 9.2 10*3/uL (ref 4.0–10.5)

## 2022-04-30 LAB — COMPREHENSIVE METABOLIC PANEL
ALT: 71 U/L — ABNORMAL HIGH (ref 0–53)
AST: 70 U/L — ABNORMAL HIGH (ref 0–37)
Albumin: 3.6 g/dL (ref 3.5–5.2)
Alkaline Phosphatase: 122 U/L — ABNORMAL HIGH (ref 39–117)
BUN: 31 mg/dL — ABNORMAL HIGH (ref 6–23)
CO2: 28 mEq/L (ref 19–32)
Calcium: 8.3 mg/dL — ABNORMAL LOW (ref 8.4–10.5)
Chloride: 101 mEq/L (ref 96–112)
Creatinine, Ser: 1.67 mg/dL — ABNORMAL HIGH (ref 0.40–1.50)
GFR: 37.54 mL/min — ABNORMAL LOW (ref >60.00)
Glucose, Bld: 190 mg/dL — ABNORMAL HIGH (ref 70–99)
Potassium: 4.5 mEq/L (ref 3.5–5.1)
Sodium: 139 mEq/L (ref 135–145)
Total Bilirubin: 0.7 mg/dL (ref 0.2–1.2)
Total Protein: 6.5 g/dL (ref 6.0–8.3)

## 2022-04-30 LAB — LIPID PANEL
Cholesterol: 122 mg/dL (ref 0–200)
HDL: 22.7 mg/dL — ABNORMAL LOW (ref >39.00)
NonHDL: 99.31
Total CHOL/HDL Ratio: 5
Triglycerides: 225 mg/dL — ABNORMAL HIGH (ref 0.0–149.0)
VLDL: 45 mg/dL — ABNORMAL HIGH (ref 0.0–40.0)

## 2022-04-30 LAB — LDL CHOLESTEROL, DIRECT: Direct LDL: 70 mg/dL

## 2022-04-30 MED ORDER — GLIMEPIRIDE 2 MG PO TABS: ORAL_TABLET | ORAL | 3 refills | Status: DC

## 2022-04-30 NOTE — Assessment & Plan Note (Signed)
With several wounds-- bandages in place -==  Wound care referral placed but app not until nov 17' \\home'$  health referral placed as well  Finish abx

## 2022-04-30 NOTE — Assessment & Plan Note (Signed)
Inc amaryl to 2 mg bid  F/u 3 months and pharmacy

## 2022-04-30 NOTE — Progress Notes (Addendum)
Subjective:   By signing my name below, I, Richard [name], attest that this documentation has been prepared under the direction and in the presence of [name] DO 04/30/2022   Patient ID: Richard [name], male    DOB: 1940-09-04, 81 y.o.   MRN: [id number]  Chief Complaint  Patient presents with   Hospitalization Follow-up    Cellulitis. Pt would also like discuss DM    HPI Patient is in today for a hospital follow up. He is accompanied by his sister.   He was presented to the ED with abrasion to his right knee and confusion to the lower leg. He reports that he fell from his golf cart 8 days before his admission on 04/24/2022. He states that he fell from his fold cart from clumsiness. When he fell, he had bleeding in the area. Prior to his admission, he had redness, swelling, warmth to the skin of his lower leg and a fever to 101. He was explaining his symptoms to his family medicine CMA and was informed to go to the ED. He notes that there was a callous at the bottom of his foot that was removed.He was discharged from the hospital on 04/27/2022. Since being discharged from the hospital, he has been taking antibiotics and reports that he will be on the antibiotics for 8 more days. He also states that the swelling in his leg is improving.  He also states that his blood pressure is normal but his blood sugars have been elevated. He is currently taking 2 tablets of 1 Mg of Glimepiride. He states that his blood sugar earlier today's visit was 160 mg/dL. The wound on the bottom of his R foot is especially painful when he walks -- the hospital recommeded referral to wound care.   When he was discharged from the ED on 01/23/2022 for acute excerebration of CHF, he was taken off of his Metformin medication due to decrease in renal function. Since being off the medication, his blood sugar levels are increasing. He is currently taking 2 tablets of 1 Mg of Glimepiride daily.  Lab Results   Component Value Date   HGBA1C 8.1 (H) 04/25/2022     Past Medical History:  Diagnosis Date   Anemia    Arthritis    hips   Axillary adenopathy 02/25/2017   Bradycardia    a. holter monitor has demonstrated HRs in 30s and [place]    CAD (coronary artery disease)    a. s/p CABG 2001  b.  07/28/2017 cath:   Severe three-vessel native CAD with total occlusion of LAD, ramus intermedius, first OM and RCA, patent RIMA to PDA, LIMA to LAD, sequential SVG to ramus intermedius and first OM.     Chronic lower back pain    Diffuse large B cell lymphoma (HCC)    Diverticulosis    Esophageal stricture    GERD (gastroesophageal reflux disease)    History of gout    HTN (hypertension)    Mixed hyperlipidemia    OSA on CPAP    with 2L O2 at night   Pancytopenia ([place])    a. related to chemo therapy for B cell lymphoma   Peptic stricture of esophagus    Presence of permanent cardiac pacemaker    sees Dr. Valaria Good pacemaker   SCC (squamous cell carcinoma) 01/31/2021   R zygoma, [facility]   SCC (squamous cell carcinoma) 01/31/2021   L post ankle, [facility]   SCC (squamous cell carcinoma)  01/31/2021   L popliteal, [facility]   Severe aortic stenosis    Spinal stenosis    Squamous cell carcinoma of skin 03/22/2009   Right mandible. SCCis, hypertrophic.    Squamous cell carcinoma of skin 12/25/2021   R lateral cheek, ED[facility]   Squamous cell carcinoma of skin 12/25/2021   R postauricular neck, ED[facility]   Squamous cell carcinoma of skin 12/25/2021   R forearm sup, ED[facility]   Squamous cell carcinoma of skin 12/25/2021   R wrist, ED[facility]   Type II diabetes mellitus ([place])    Wears dentures    partial upper    Past Surgical History:  Procedure Laterality Date   APPENDECTOMY  ~ [place] Left 11/06/2016   Procedure: CATARACT EXTRACTION PHACO AND INTRAOCULAR LENS PLACEMENT ([place]);  Surgeon: Estill Cotta, MD;  Location: [facility];  Service: Ophthalmology;  Laterality:  Left;  Lot # [id number] H [place]: 01:09.4 AP%:25.2 CDE: 30.64   CATARACT EXTRACTION W/PHACO Right 12/04/2016   Procedure: CATARACT EXTRACTION PHACO AND INTRAOCULAR LENS PLACEMENT (I[facility]);  Surgeon: Estill Cotta, MD;  Location: [facility];  Service: Ophthalmology;  Laterality: Right;  [place] 1:25.9 AP% 24.1 CDE 39.10 Fluid Pack lot # [id number]   COLONOSCOPY W/ BIOPSIES AND POLYPECTOMY  07/02/2011   CORONARY ANGIOPLASTY  07/02/1991   CORONARY ANGIOPLASTY WITH STENT PLACEMENT  05/31/1997   "1"   CORONARY ARTERY BYPASS GRAFT  03/01/2000   "CABG X5"   ECTROPION REPAIR Right 09/01/2018   Procedure: REPAIR OF ECTROPION BILATERAL upper and lower;  Surgeon: Karle Starch, MD;  Location: [facility];  Service: Ophthalmology;  Laterality: Right;  Diabetic - oral meds sleep apnea   ESOPHAGEAL DILATION  X 3-4   Dr. Lyla Son; "last one was in the 1990's"   ESOPHAGOGASTRODUODENOSCOPY     multiple   FLEXIBLE SIGMOIDOSCOPY     multiple   HYDRADENITIS EXCISION Left 02/25/2017   Procedure: EXCISION DEEP LEFT AXILLARY LYMPH NODE;  Surgeon: Fanny Skates, MD;  Location: [facility];  Service: General;  Laterality: Left;   INTRAOPERATIVE TRANSTHORACIC ECHOCARDIOGRAM N/A 09/09/2017   Procedure: INTRAOPERATIVE TRANSTHORACIC ECHOCARDIOGRAM;  Surgeon: Burnell Blanks, MD;  Location: [facility];  Service: Open Heart Surgery;  Laterality: N/A;   KNEE ARTHROSCOPY Left 07/01/2009   meniscus repair   LEFT HEART CATHETERIZATION WITH CORONARY/GRAFT ANGIOGRAM N/A 03/16/2014   Procedure: LEFT HEART CATHETERIZATION WITH Beatrix Fetters;  Surgeon: Burnell Blanks, MD;  Location: [facility] CATH LAB;  Service: Cardiovascular;  Laterality: N/A;   LUMBAR LAMINECTOMY/DECOMPRESSION MICRODISCECTOMY Right 06/17/2013   Procedure: LUMBAR LAMINECTOMY MICRODISCECTOMY L4-L5 RIGHT EXCISION OF SYNOVIAL CYST RIGHT   (1 LEVEL) RIGHT PARTIAL FACETECTOMY;  Surgeon: Tobi Bastos, MD;  Location: [facility];  Service: Orthopedics;   Laterality: Right;   MYELOGRAM  04/06/2013   lumbar, Dr [place] Lighter   ORBITAL LESION EXCISION Right 09/01/2018   Procedure: ORBITOTOMY WITHOUT BONE FLAP WITH REMOVAL OF LESION RIGHT;  Surgeon: Karle Starch, MD;  Location: [facility];  Service: Ophthalmology;  Laterality: Right;   PACEMAKER IMPLANT N/A 09/10/2017   Procedure: PACEMAKER IMPLANT;  Surgeon: Deboraha Sprang, MD;  Location: [facility] CV LAB;  Service: Cardiovascular;  Laterality: N/A;   PANENDOSCOPY     PERIPHERAL VASCULAR BALLOON ANGIOPLASTY Left 03/16/2021   Procedure: PERIPHERAL VASCULAR BALLOON ANGIOPLASTY;  Surgeon: [name], MD;  Location: [facility] LAB;  Service: Cardiovascular;  Laterality: Left;  subclavian  vein   PORTACATH PLACEMENT  N/A 03/06/2017   Procedure: INSERTION PORT-A-CATH AND ASPIRATE SEROMA LEFT AXILLA;  Surgeon: Fanny Skates, MD;  Location: [facility];  Service: General;  Laterality: N/A;   RIGHT/LEFT HEART CATH AND CORONARY/GRAFT ANGIOGRAPHY N/A 07/28/2017   Procedure: RIGHT/LEFT HEART CATH AND CORONARY/GRAFT ANGIOGRAPHY;  Surgeon: Sherren Mocha, MD;  Location: [facility] CV LAB;  Service: Cardiovascular;  Laterality: N/A;   SHOULDER SURGERY Right 08/30/2010   screws placed; "tendons tore off"   SKIN CANCER EXCISION Right    "neck"   TONSILLECTOMY  ~ [place] Left 06/29/2019   Procedure: TOTAL KNEE ARTHROPLASTY;  Surgeon: Paralee Cancel, MD;  Location: [facility];  Service: Orthopedics;  Laterality: Left;  70 mins   TRANSCATHETER AORTIC VALVE REPLACEMENT, TRANSFEMORAL N/A 09/09/2017   Procedure: TRANSCATHETER AORTIC VALVE REPLACEMENT, TRANSFEMORAL;  Surgeon: Burnell Blanks, MD;  Location: [facility];  Service: Open Heart Surgery;  Laterality: N/A;   UPPER EXTREMITY VENOGRAPHY Left 02/15/2021   Procedure: CENTRAL VENO;  Surgeon: [name], MD;  Location: [facility];  Service: Cardiovascular;  Laterality: Left;   UPPER EXTREMITY VENOGRAPHY N/A  03/16/2021   Procedure: UPPER EXTREMITY VENOGRAPHY;  Surgeon: [name], MD;  Location: [facility] CV LAB;  Service: Cardiovascular;  Laterality: N/A;   UPPER GI ENDOSCOPY  07/02/2011   Gastritis; Dr Carlean Purl   VASECTOMY     wireless pacemaker placed      Family History  Problem Relation Age of Onset   Stroke Father    Hypertension Father    Pancreatic cancer Mother    Diabetes Maternal Grandmother    Stroke Maternal Grandmother    Heart attack Paternal Grandmother    Colon cancer Neg Hx    Esophageal cancer Neg Hx    Rectal cancer Neg Hx    Stomach cancer Neg Hx    Ulcers Neg Hx     Social History   Socioeconomic History   Marital status: Divorced    Spouse name: Not on file   Number of children: 2   Years of education: college   Highest education level: Not on file  Occupational History    Employer: RETIRED  Tobacco Use   Smoking status: Never   Smokeless tobacco: Never  Vaping Use   Vaping Use: Never used  Substance and Sexual Activity   Alcohol use: No    Alcohol/week: 0.0 standard drinks of alcohol    Comment: "last drink was in 2012"( 03/15/2014)   Drug use: No   Sexual activity: Not Currently  Other Topics Concern   Not on file  Social History Narrative   ** Merged History Encounter **       Divorced, lives with a roommate. 1 son one daughter 3-4 caffeinated beverages daily Right-handed. 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   Social Determinants of Health   Financial Resource Strain: Low Risk  (02/11/2022)   Overall Financial Resource Strain (CARDIA)    Difficulty of Paying Living Expenses: Not very hard  Food Insecurity: No Food Insecurity (04/25/2022)   Hunger Vital Sign    Worried About Running Out of Food in the Last Year: Never true    Ran Out of Food in the Last Year: Never true  Transportation Needs: No  Transportation Needs (04/25/2022)   PRAPARE - Hydrologist (Medical): No  Lack of Transportation (Non-Medical): No  Physical Activity: Insufficiently Active (02/11/2022)   Exercise Vital Sign    Days of Exercise per Week: 5 days    Minutes of Exercise per Session: 10 min  Stress: Not on file  Social Connections: Moderately Isolated (09/06/2021)   Social Connection and Isolation Panel [NHANES]    Frequency of Communication with Friends and Family: More than three times a week    Frequency of Social Gatherings with Friends and Family: More than three times a week    Attends Religious Services: More than 4 times per year    Active Member of [employer] or Organizations: No    Attends Archivist Meetings: Never    Marital Status: Divorced  Human resources officer Violence: Not At Risk (04/25/2022)   Humiliation, Afraid, Rape, and Kick questionnaire    Fear of Current or Ex-Partner: No    Emotionally Abused: No    Physically Abused: No    Sexually Abused: No    Outpatient Medications Prior to Visit  Medication Sig Dispense Refill   acetaminophen (TYLENOL) 325 MG tablet Take 650 mg by mouth at bedtime as needed for moderate pain or headache.     allopurinol (ZYLOPRIM) 300 MG tablet Take 450 mg by mouth daily.     aluminum hydroxide-magnesium carbonate (GAVISCON) 95-358 MG/15ML SUSP Take 15 mLs by mouth as needed for indigestion or heartburn.     amLODipine (NORVASC) 5 MG tablet Take 5 mg by mouth daily.     apixaban (ELIQUIS) 2.5 MG TABS tablet Take 1 tablet (2.5 mg total) by mouth 2 (two) times daily. 180 tablet 2   Ascorbic Acid (VITAMIN C PO) Take 1 tablet by mouth daily with breakfast.     atorvastatin (LIPITOR) 80 MG tablet TAKE 1 TABLET BY MOUTH EVERYDAY AT BEDTIME (Patient taking differently: Take 80 mg by mouth at bedtime.) 90 tablet 3   azelastine (ASTELIN) 0.1 % nasal spray PLACE 2 SPRAYS INTO BOTH NOSTRILS AT BEDTIME AS NEEDED FOR RHINITIS OR ALLERGIES.  30 mL 4   cephALEXin (KEFLEX) 500 MG capsule Take 1 capsule (500 mg total) by mouth 4 (four) times daily for 10 days. 40 capsule 0   Coenzyme Q10 (CO Q-10 PO) Take 1 capsule by mouth daily.     esomeprazole (NEXIUM) 40 MG capsule Take 1 capsule (40 mg total) by mouth daily. (Patient taking differently: Take 40 mg by mouth daily before breakfast.) 30 capsule 5   ezetimibe (ZETIA) 10 MG tablet TAKE 1 TABLET BY MOUTH EVERY DAY (Patient taking differently: Take 10 mg by mouth daily.) 90 tablet 3   FARXIGA 10 MG TABS tablet Take 10 mg by mouth in the morning.     fenofibrate 160 MG tablet TAKE 1 TABLET BY MOUTH EVERY DAY (Patient taking differently: Take 160 mg by mouth daily.) 90 tablet 0   folic acid (FOLVITE) 1 MG tablet TAKE 2 TABLETS BY MOUTH EVERY DAY (Patient taking differently: Take 2 mg by mouth in the morning.) 180 tablet 4   FREESTYLE LITE test strip USE TO TEST BLOOD SUGAR ONCE A DAY. DX CODE: E11.9 100 strip 12   icosapent Ethyl (VASCEPA) 1 g capsule TAKE 2 CAPSULES BY MOUTH TWICE A DAY (Patient taking differently: Take 2 g by mouth in the morning and at bedtime.) 360 capsule 1   Lancets (FREESTYLE) lancets USE ONCE A DAY TO CHECK BLOOD SUGAR. 100 each 12   linezolid (ZYVOX) 600 MG tablet Take 1 tablet (600 mg total) by  mouth 2 (two) times daily for 10 days. 20 tablet 0   Menthol-Camphor (ICY HOT ADVANCED PAIN RELIEF EX) Apply 1 application  topically daily as needed (to painful sites).     metoprolol succinate (TOPROL-XL) 25 MG 24 hr tablet Take 1 tablet (25 mg total) by mouth daily. 90 tablet 3   nitroGLYCERIN (NITROSTAT) 0.4 MG SL tablet PLACE 1 TABLET (0.4 MG TOTAL) UNDER THE TONGUE EVERY 5 (FIVE) MINUTES AS NEEDED FOR CHEST PAIN. 25 tablet 6   oxyCODONE (OXY IR/ROXICODONE) 5 MG immediate release tablet Take 1 tablet (5 mg total) by mouth every 6 (six) hours as needed for moderate pain. 20 tablet 0   potassium chloride SA (KLOR-CON M20) 20 MEQ tablet Take 2 tablets (40 mEq total) by mouth  daily. 60 tablet 0   pregabalin (LYRICA) 150 MG capsule TAKE 1 CAPSULE BY MOUTH TWICE A DAY 180 capsule 1   psyllium (METAMUCIL) 58.6 % powder Take 1 packet by mouth daily as needed (for constipation- mix and drink).     senna (SENOKOT) 8.6 MG TABS tablet Take 2 tablets (17.2 mg total) by mouth at bedtime. This is for constipation.  Stop taking if you start having diarrhea. (Patient taking differently: Take 2 tablets by mouth daily as needed for mild constipation. This is for constipation.  Stop taking if you start having diarrhea.) 20 tablet 0   torsemide (DEMADEX) 20 MG tablet Take 2 tablets (40 mg total) by mouth in the morning. Do not take on 01/24/2022. 180 tablet 3   glimepiride (AMARYL) 1 MG tablet Take 2 mg by mouth every morning.     No facility-administered medications prior to visit.    Allergies  Allergen Reactions   Ace Inhibitors Swelling and Other (See Comments)    Angioedema    Benazepril Swelling and Other (See Comments)    Angioedema; he is not a candidate for any angiotensin receptor blockers because of this significant allergic reaction. Because of a history of documented adverse serious drug reaction;Medi Alert bracelet  is recommended   Entresto [Sacubitril-Valsartan] Swelling and Other (See Comments)    First-in-Class Angiotensin Receptor Neprilysin Inhibitor- Med was "red-flagged" by the patient's pharmacy for him to NOT take!   Hctz [Hydrochlorothiazide] Anaphylaxis and Swelling    Tongue and lip swelling    Aspirin Other (See Comments)    Gastritis, can aspirin not take 325 mg aspirin    Review of Systems  Constitutional:  Negative for chills, fever and malaise/fatigue.  HENT:  Negative for congestion and hearing loss.   Eyes:  Negative for blurred vision and discharge.  Respiratory:  Negative for cough, sputum production and shortness of breath.   Cardiovascular:  Negative for chest pain, palpitations and leg swelling.  Gastrointestinal:  Negative for  abdominal pain, blood in stool, constipation, diarrhea, heartburn, nausea and vomiting.  Genitourinary:  Negative for dysuria, frequency, hematuria and urgency.  Musculoskeletal:  Negative for back pain, falls and myalgias.  Skin:  Negative for rash.  Neurological:  Negative for dizziness, sensory change, loss of consciousness, weakness and headaches.  Endo/Heme/Allergies:  Negative for environmental allergies. Does not bruise/bleed easily.  Psychiatric/Behavioral:  Negative for depression and suicidal ideas. The patient is not nervous/anxious and does not have insomnia.        Objective:    Physical Exam Vitals and nursing note reviewed.  Constitutional:      General: He is not in acute distress.    Appearance: Normal appearance. He is well-developed. He is not  ill-appearing.  HENT:     Head: Normocephalic and atraumatic.     Right Ear: External ear normal.     Left Ear: External ear normal.  Eyes:     Extraocular Movements: Extraocular movements intact.     Pupils: Pupils are equal, round, and reactive to light.  Neck:     Thyroid: No thyromegaly.  Cardiovascular:     Rate and Rhythm: Normal rate and regular rhythm.     Heart sounds: Normal heart sounds. No murmur heard.    No gallop.  Pulmonary:     Effort: Pulmonary effort is normal. No respiratory distress.     Breath sounds: Normal breath sounds. No wheezing or rales.  Chest:     Chest wall: No tenderness.  Musculoskeletal:     Cervical back: Normal range of motion and neck supple.     Right hip: Tenderness present. Normal range of motion. Normal strength.     Left hip: Tenderness present. Normal range of motion. Normal strength.     Right foot: Bony tenderness present. No swelling.     Left foot: Bony tenderness present. No swelling.  Skin:    General: Skin is warm and dry.     Findings: Wound present.     Comments: Wound on right and left knee Wound bottom of right foot Redness of right foot  Neurological:      Mental Status: He is alert and oriented to person, place, and time.  Psychiatric:        Mood and Affect: Mood normal.        Behavior: Behavior normal.        Thought Content: Thought content normal.        Judgment: Judgment normal.        BP 138/88 (BP Location: Left Arm, Patient Position: Sitting, Cuff Size: Large)   Pulse 77   Temp 98.5 F (36.9 C) (Oral)   Resp 20   Ht 6' (1.829 m)   SpO2 97%   BMI 31.60 kg/m  Wt Readings from Last 3 Encounters:  04/27/22 233 lb (105.7 kg)  04/17/22 234 lb (106.1 kg)  04/08/22 237 lb (107.5 kg)    Diabetic Foot Exam - Simple   No data filed    Lab Results  Component Value Date   WBC 7.9 04/27/2022   HGB 11.3 (L) 04/27/2022   HCT 35.1 (L) 04/27/2022   PLT 141 (L) 04/27/2022   GLUCOSE 133 (H) 04/27/2022   CHOL 138 01/25/2022   TRIG 164.0 (H) 01/25/2022   HDL 28.90 (L) 01/25/2022   LDLDIRECT 58.0 07/26/2021   LDLCALC 76 01/25/2022   ALT 21 04/25/2022   AST 23 04/25/2022   NA 136 04/27/2022   K 3.4 (L) 04/27/2022   CL 106 04/27/2022   CREATININE 1.40 (H) 04/27/2022   BUN 30 (H) 04/27/2022   CO2 21 (L) 04/27/2022   TSH 2.450 07/27/2018   PSA 1.03 07/26/2021   INR 1.15 11/15/2017   HGBA1C 8.1 (H) 04/25/2022   MICROALBUR <0.7 07/26/2021    Lab Results  Component Value Date   TSH 2.450 07/27/2018   Lab Results  Component Value Date   WBC 7.9 04/27/2022   HGB 11.3 (L) 04/27/2022   HCT 35.1 (L) 04/27/2022   MCV 90.5 04/27/2022   PLT 141 (L) 04/27/2022   Lab Results  Component Value Date   NA 136 04/27/2022   K 3.4 (L) 04/27/2022   CO2 21 (L) 04/27/2022   GLUCOSE 133 (  H) 04/27/2022   BUN 30 (H) 04/27/2022   CREATININE 1.40 (H) 04/27/2022   BILITOT 1.0 04/25/2022   ALKPHOS 49 04/25/2022   AST 23 04/25/2022   ALT 21 04/25/2022   PROT 7.0 04/25/2022   ALBUMIN 3.5 04/25/2022   CALCIUM 8.3 (L) 04/27/2022   ANIONGAP 9 04/27/2022   EGFR 35 (L) 02/08/2022   GFR 33.70 (L) 01/25/2022   Lab Results  Component  Value Date   CHOL 138 01/25/2022   Lab Results  Component Value Date   HDL 28.90 (L) 01/25/2022   Lab Results  Component Value Date   LDLCALC 76 01/25/2022   Lab Results  Component Value Date   TRIG 164.0 (H) 01/25/2022   Lab Results  Component Value Date   CHOLHDL 5 01/25/2022   Lab Results  Component Value Date   HGBA1C 8.1 (H) 04/25/2022       Assessment & Plan:   Problem List Items Addressed This Visit       Unprioritized   Hyperlipidemia associated with type 2 diabetes mellitus ([place])   Relevant Medications   glimepiride (AMARYL) 2 MG tablet   Other Relevant Orders   CBC with Differential/Platelet   Comprehensive metabolic panel   Lipid panel   Essential hypertension   Relevant Orders   CBC with Differential/Platelet   Comprehensive metabolic panel   Lipid panel   Type 2 DM with CKD stage 3 and hypertension ([place])    Inc amaryl to 2 mg bid  F/u 3 months and pharmacy        Relevant Medications   glimepiride (AMARYL) 2 MG tablet   Cellulitis of lower extremity - Primary    With several wounds-- bandages in place -==  Wound care referral placed but app not until nov 17 \home health referral placed as well  Finish abx       Relevant Orders   Ambulatory referral to [facility]   Ambulatory referral to [facility] ordered this encounter  Medications   glimepiride (AMARYL) 2 MG tablet    Sig: 1 po bid    Dispense:  60 tablet    Refill:  3    I, [name], DO, personally preformed the services described in this documentation.  All medical record entries made by the scribe were at my direction and in my presence.  I have reviewed the chart and discharge instructions (if applicable) and agree that the record reflects my personal performance and is accurate and complete. 04/30/2022   I,Amber Collins,acting as a scribe for [name], DO.,have documented all relevant documentation on the behalf of [name], DO,as  directed by  [name], DO while in the presence of [name], DO.    [name], DO

## 2022-05-01 ENCOUNTER — Telehealth: Payer: Self-pay

## 2022-05-01 NOTE — Telephone Encounter (Signed)
Caller Name Jennings Phone Number [phone] Patient Name Richard [name] Patient DOB [birth year] Call Type Message Only Information Provided Reason for Call Request for General Office Information Initial Comment Caller states he was to get home care and wants to speak to [place]. He was contacted by 2 companies. He is wants to make sure which one he is to schedule with for morning. Adorations? Additional Comment hrs Disp. Time Disposition Final User 04/30/2022 5:14:08 PM General Information Provided Yes Idolina Primer Call Closed By: Idolina Primer Transaction Date/Time: 04/30/2022 5:10:09 PM (ET)

## 2022-05-01 NOTE — Telephone Encounter (Signed)
Spoke with patient. Pt was advised that we did not have a Erica here. I advised that we placed two referrals yesterday for home health and wound care. Pt has a app with wound care on 11/14

## 2022-05-02 ENCOUNTER — Telehealth: Payer: Self-pay | Admitting: Family Medicine

## 2022-05-02 DIAGNOSIS — K219 Gastro-esophageal reflux disease without esophagitis: Secondary | ICD-10-CM | POA: Diagnosis not present

## 2022-05-02 DIAGNOSIS — M109 Gout, unspecified: Secondary | ICD-10-CM | POA: Diagnosis not present

## 2022-05-02 DIAGNOSIS — L988 Other specified disorders of the skin and subcutaneous tissue: Secondary | ICD-10-CM | POA: Diagnosis not present

## 2022-05-02 DIAGNOSIS — I2510 Atherosclerotic heart disease of native coronary artery without angina pectoris: Secondary | ICD-10-CM | POA: Diagnosis not present

## 2022-05-02 DIAGNOSIS — I4892 Unspecified atrial flutter: Secondary | ICD-10-CM | POA: Diagnosis not present

## 2022-05-02 DIAGNOSIS — G4733 Obstructive sleep apnea (adult) (pediatric): Secondary | ICD-10-CM | POA: Diagnosis not present

## 2022-05-02 DIAGNOSIS — Z6833 Body mass index (BMI) 33.0-33.9, adult: Secondary | ICD-10-CM | POA: Diagnosis not present

## 2022-05-02 DIAGNOSIS — D630 Anemia in neoplastic disease: Secondary | ICD-10-CM | POA: Diagnosis not present

## 2022-05-02 DIAGNOSIS — I442 Atrioventricular block, complete: Secondary | ICD-10-CM | POA: Diagnosis not present

## 2022-05-02 DIAGNOSIS — I5042 Chronic combined systolic (congestive) and diastolic (congestive) heart failure: Secondary | ICD-10-CM | POA: Diagnosis not present

## 2022-05-02 DIAGNOSIS — S81001D Unspecified open wound, right knee, subsequent encounter: Secondary | ICD-10-CM | POA: Diagnosis not present

## 2022-05-02 DIAGNOSIS — N1832 Chronic kidney disease, stage 3b: Secondary | ICD-10-CM | POA: Diagnosis not present

## 2022-05-02 DIAGNOSIS — D631 Anemia in chronic kidney disease: Secondary | ICD-10-CM | POA: Diagnosis not present

## 2022-05-02 DIAGNOSIS — E1169 Type 2 diabetes mellitus with other specified complication: Secondary | ICD-10-CM | POA: Diagnosis not present

## 2022-05-02 DIAGNOSIS — E1122 Type 2 diabetes mellitus with diabetic chronic kidney disease: Secondary | ICD-10-CM | POA: Diagnosis not present

## 2022-05-02 DIAGNOSIS — C8330 Diffuse large B-cell lymphoma, unspecified site: Secondary | ICD-10-CM | POA: Diagnosis not present

## 2022-05-02 DIAGNOSIS — I130 Hypertensive heart and chronic kidney disease with heart failure and stage 1 through stage 4 chronic kidney disease, or unspecified chronic kidney disease: Secondary | ICD-10-CM | POA: Diagnosis not present

## 2022-05-02 DIAGNOSIS — E782 Mixed hyperlipidemia: Secondary | ICD-10-CM | POA: Diagnosis not present

## 2022-05-02 DIAGNOSIS — M160 Bilateral primary osteoarthritis of hip: Secondary | ICD-10-CM | POA: Diagnosis not present

## 2022-05-02 DIAGNOSIS — E1142 Type 2 diabetes mellitus with diabetic polyneuropathy: Secondary | ICD-10-CM | POA: Diagnosis not present

## 2022-05-02 DIAGNOSIS — K5790 Diverticulosis of intestine, part unspecified, without perforation or abscess without bleeding: Secondary | ICD-10-CM | POA: Diagnosis not present

## 2022-05-02 DIAGNOSIS — S81002D Unspecified open wound, left knee, subsequent encounter: Secondary | ICD-10-CM | POA: Diagnosis not present

## 2022-05-02 DIAGNOSIS — L03115 Cellulitis of right lower limb: Secondary | ICD-10-CM | POA: Diagnosis not present

## 2022-05-02 DIAGNOSIS — K222 Esophageal obstruction: Secondary | ICD-10-CM | POA: Diagnosis not present

## 2022-05-02 NOTE — Telephone Encounter (Signed)
Mickel Baas from [facility] is requesting more specific wound care orders. Also skilled nursing will start 11/6 and will be 2x for 8 weeks. Number is left in contact info for wound orders.

## 2022-05-03 ENCOUNTER — Encounter: Payer: Self-pay | Admitting: Family

## 2022-05-06 NOTE — Telephone Encounter (Signed)
Returned call. LDVM with below orders

## 2022-05-07 ENCOUNTER — Telehealth: Payer: Self-pay | Admitting: Family Medicine

## 2022-05-07 DIAGNOSIS — S81002D Unspecified open wound, left knee, subsequent encounter: Secondary | ICD-10-CM | POA: Diagnosis not present

## 2022-05-07 DIAGNOSIS — L03115 Cellulitis of right lower limb: Secondary | ICD-10-CM | POA: Diagnosis not present

## 2022-05-07 DIAGNOSIS — E1142 Type 2 diabetes mellitus with diabetic polyneuropathy: Secondary | ICD-10-CM | POA: Diagnosis not present

## 2022-05-07 DIAGNOSIS — L988 Other specified disorders of the skin and subcutaneous tissue: Secondary | ICD-10-CM | POA: Diagnosis not present

## 2022-05-07 DIAGNOSIS — I130 Hypertensive heart and chronic kidney disease with heart failure and stage 1 through stage 4 chronic kidney disease, or unspecified chronic kidney disease: Secondary | ICD-10-CM | POA: Diagnosis not present

## 2022-05-07 DIAGNOSIS — S81001D Unspecified open wound, right knee, subsequent encounter: Secondary | ICD-10-CM | POA: Diagnosis not present

## 2022-05-07 NOTE — Telephone Encounter (Signed)
Verbal given. FYI 

## 2022-05-07 NOTE — Telephone Encounter (Signed)
[facility]) called with the following information:  Pt had a fall on 11.6.23 @ 2p. Pt has opened scabs on wounds but there was no head injury. Pt did not pass out and did not call 911.  Tye [place] also wanted to get ok on clean affected area w/ NS apply xerosorm with a bordered thumb dressing twice a week.  Pt is scheduled for appt w/ wound center 11.14.23

## 2022-05-08 ENCOUNTER — Other Ambulatory Visit: Payer: Self-pay | Admitting: Family Medicine

## 2022-05-08 DIAGNOSIS — R945 Abnormal results of liver function studies: Secondary | ICD-10-CM

## 2022-05-08 NOTE — Progress Notes (Signed)
Cardiology Office Note:    Date:  05/08/2022   ID:  Richard [name], DOB [birth year], MRN [id number]  PCP:  Carollee Herter, [place] Prov[facility] Cardiologist:  Minus Breeding, MD Electrophysiologist:  [name], MD {}    Referring MD: Carollee Herter, Alferd Apa, *   No chief complaint on file. ***  History of Present Illness:    Richard [name] is a 81 y.o. male with a hx of chronic combined systolic and diastolic heart failure, CAD s/p CABG x 5 2001, AS s/p TAVR 08/2017, DM2, CKD stage IIIb, HTN, CHB s/p leadless pacemaker in response to subclavian steal to PPM, and HLD. He has a history of B-cell lymphoma and underwent 8 cycles of R-CHOP 08/2017. He was hospitalized 07/2017 with NSTEMI.  LHC showed known severe native disease and patent RIMA-PDA, LIMA-LAD, sequential SVG-ramus-OM1.  He also had severe AS and ultimately underwent TAVR 08/2017.  Following TAVR he developed 2:1 AV block and had a PPM placed 09/10/2017. He also developed PAF and was anticoagulated with eliquis along with ASA post-TAVR. He developed LUE swelling found ot have critical stenosis of subclavian vein due to pacemaker wires. He was referred to [facility] for lead extraction and placement of micra leadless pacemaker. This improved LUE swelling. He was seen in clinic 11/29/21 with DOE. Torsemide was temporarily increased and echo obtained that showed LVEF 30-35%, RWMA, mildly reduced RV, mild to moderate MR, and good aortic valve function. Stress myoview 12/18/21 showed no new ischemia. Newly reduced EF felt maybe related to new leadless pacemaker. Amlodipine was stopped. He had clinic follow up pulmonology and was significantly volume up. He was sent to the ER for evaluation and was hospitalized for IV diuresis. He was discharged on 40 mg torsemide. At follow up with Dr. Percival Spanish, he was doing well.    Chronic systolic heart failure Hypertension Angiodema to ACEI, not a candidate for ARB or ARNI.  GDMT also limited  by BP and renal function MRA left for nephrology to decide GDMT: toprol, farxiga, 40 mg torsemide + 40 mEq K Amlodipine was discontinued at last discharge   Permanent atrial fibrillation Chronic anticoagulation Continue rate control with metoprolol and stroke PPX with eliquis 2.5 mg BID   CAD s/p CABG 2001 Hyperlipidemia with LDL goal < 70 Last heart cath prior to TAVR with patent grafts 80 mg lipitor, zetia, fenofibrate, and vascepa 01/25/2022: LDL Cholesterol 76 04/30/2022: Cholesterol 122; HDL 22.70; Triglycerides 225.0; VLDL 45.0   2:1 AVB post TAVR now s/p leadless pacemaker Followed by Dr. Caryl Comes   AS s/p TAVR Now off ASA Last echo with good valve function   Past Medical History:  Diagnosis Date   Anemia    Arthritis    hips   Axillary adenopathy 02/25/2017   Bradycardia    a. holter monitor has demonstrated HRs in 30s and Weinkibach    CAD (coronary artery disease)    a. s/p CABG 2001  b.  07/28/2017 cath:   Severe three-vessel native CAD with total occlusion of LAD, ramus intermedius, first OM and RCA, patent RIMA to PDA, LIMA to LAD, sequential SVG to ramus intermedius and first OM.     Chronic lower back pain    Diffuse large B cell lymphoma (HCC)    Diverticulosis    Esophageal stricture    GERD (gastroesophageal reflux disease)    History of gout    HTN (hypertension)    Mixed hyperlipidemia    OSA  on CPAP    with 2L O2 at night   Pancytopenia (HCC)    a. related to chemo therapy for B cell lymphoma   Peptic stricture of esophagus    Presence of permanent cardiac pacemaker    sees Dr. Valaria Good pacemaker   SCC (squamous cell carcinoma) 01/31/2021   R zygoma, [facility]   SCC (squamous cell carcinoma) 01/31/2021   L post ankle, [facility]   SCC (squamous cell carcinoma) 01/31/2021   L popliteal, ED[facility]   Severe aortic stenosis    Spinal stenosis    Squamous cell carcinoma of skin 03/22/2009   Right mandible. SCCis, hypertrophic.    Squamous cell carcinoma  of skin 12/25/2021   R lateral cheek, EDC   Squamous cell carcinoma of skin 12/25/2021   R postauricular neck, EDC   Squamous cell carcinoma of skin 12/25/2021   R forearm sup, EDC   Squamous cell carcinoma of skin 12/25/2021   R wrist, ED[facility]   Type II diabetes mellitus ([place])    Wears dentures    partial upper    Past Surgical History:  Procedure Laterality Date   APPENDECTOMY  ~ [place] Left 11/06/2016   Procedure: CATARACT EXTRACTION PHACO AND INTRAOCULAR LENS PLACEMENT ([place]);  Surgeon: Estill Cotta, MD;  Location: [facility];  Service: Ophthalmology;  Laterality: Left;  Lot # [id number] H [place]: 01:09.4 AP%:25.2 CDE: 30.64   CATARACT EXTRACTION W/PHACO Right 12/04/2016   Procedure: CATARACT EXTRACTION PHACO AND INTRAOCULAR LENS PLACEMENT (IOC);  Surgeon: Estill Cotta, MD;  Location: [facility];  Service: Ophthalmology;  Laterality: Right;  [place] 1:25.9 AP% 24.1 CDE 39.10 Fluid Pack lot # [id number]   COLONOSCOPY W/ BIOPSIES AND POLYPECTOMY  07/02/2011   CORONARY ANGIOPLASTY  07/02/1991   CORONARY ANGIOPLASTY WITH STENT PLACEMENT  05/31/1997   "1"   CORONARY ARTERY BYPASS GRAFT  03/01/2000   "CABG X5"   ECTROPION REPAIR Right 09/01/2018   Procedure: REPAIR OF ECTROPION BILATERAL upper and lower;  Surgeon: Karle Starch, MD;  Location: [place];  Service: Ophthalmology;  Laterality: Right;  Diabetic - oral meds sleep apnea   ESOPHAGEAL DILATION  X 3-4   Dr. Lyla Son; "last one was in the 1990's"   ESOPHAGOGASTRODUODENOSCOPY     multiple   FLEXIBLE SIGMOIDOSCOPY     multiple   HYDRADENITIS EXCISION Left 02/25/2017   Procedure: EXCISION DEEP LEFT AXILLARY LYMPH NODE;  Surgeon: Fanny Skates, MD;  Location: [place];  Service: General;  Laterality: Left;   INTRAOPERATIVE TRANSTHORACIC ECHOCARDIOGRAM N/A 09/09/2017   Procedure: INTRAOPERATIVE TRANSTHORACIC ECHOCARDIOGRAM;  Surgeon: Burnell Blanks, MD;  Location: [place];  Service: Open Heart Surgery;  Laterality: N/A;   KNEE ARTHROSCOPY Left 07/01/2009   meniscus repair   LEFT HEART CATHETERIZATION WITH CORONARY/GRAFT ANGIOGRAM N/A 03/16/2014   Procedure: LEFT HEART CATHETERIZATION WITH Beatrix Fetters;  Surgeon: Burnell Blanks, MD;  Location: [facility] C[facility];  Service: Cardiovascular;  Laterality: N/A;   LUMBAR LAMINECTOMY/DECOMPRESSION MICRODISCECTOMY Right 06/17/2013   Procedure: LUMBAR LAMINECTOMY MICRODISCECTOMY L4-L5 RIGHT EXCISION OF SYNOVIAL CYST RIGHT   (1 LEVEL) RIGHT PARTIAL FACETECTOMY;  Surgeon: Tobi Bastos, MD;  Location: [facility];  Service: Orthopedics;  Laterality: Right;   MYELOGRAM  04/06/2013   lumbar, Dr Gladstone Lighter   ORBITAL LESION EXCISION Right 09/01/2018   Procedure: ORBITOTOMY WITHOUT BONE FLAP WITH REMOVAL OF LESION RIGHT;  Surgeon: Karle Starch, MD;  Location: [place];  Service: Ophthalmology;  Laterality: Right;   PACEMAKER IMPLANT N/A 09/10/2017   Procedure: PACEMAKER IMPLANT;  Surgeon: Deboraha Sprang, MD;  Location: [place] [facility];  Service: Cardiovascular;  Laterality: N/A;   PANENDOSCOPY     PERIPHERAL VASCULAR BALLOON ANGIOPLASTY Left 03/16/2021   Procedure: PERIPHERAL VASCULAR BALLOON ANGIOPLASTY;  Surgeon: [name], MD;  Location: [place] [facility];  Service: Cardiovascular;  Laterality: Left;  subclavian  vein   PORTACATH PLACEMENT N/A 03/06/2017   Procedure: INSERTION PORT-A-CATH AND ASPIRATE SEROMA LEFT AXILLA;  Surgeon: Fanny Skates, MD;  Location: [place];  Service: General;  Laterality: N/A;   RIGHT/LEFT HEART CATH AND CORONARY/GRAFT ANGIOGRAPHY N/A 07/28/2017   Procedure: RIGHT/LEFT HEART CATH AND CORONARY/GRAFT ANGIOGRAPHY;  Surgeon: Sherren Mocha, MD;  Location: [place] [facility];  Service: Cardiovascular;  Laterality: N/A;   SHOULDER SURGERY Right 08/30/2010   screws placed; "tendons tore off"   SKIN CANCER EXCISION Right    "neck"   TONSILLECTOMY  ~ [place] Left 06/29/2019   Procedure: TOTAL KNEE ARTHROPLASTY;  Surgeon: Paralee Cancel, MD;  Location: [facility];  Service: Orthopedics;  Laterality: Left;  70 mins   TRANSCATHETER AORTIC VALVE REPLACEMENT, TRANSFEMORAL N/A 09/09/2017   Procedure: TRANSCATHETER AORTIC VALVE REPLACEMENT, TRANSFEMORAL;  Surgeon: Burnell Blanks, MD;  Location: [place];  Service: Open Heart Surgery;  Laterality: N/A;   UPPER EXTREMITY VENOGRAPHY Left 02/15/2021   Procedure: CENTRAL VENO;  Surgeon: [name], MD;  Location: [place] [facility];  Service: Cardiovascular;  Laterality: Left;   UPPER EXTREMITY VENOGRAPHY N/A 03/16/2021   Procedure: UPPER EXTREMITY VENOGRAPHY;  Surgeon: [name], MD;  Location: [place] CV LAB;  Service: Cardiovascular;  Laterality: N/A;   UPPER GI ENDOSCOPY  07/02/2011   Gastritis; Dr Carlean Purl   VASECTOMY     wireless pacemaker placed      Current Medications: No outpatient medications have been marked as taking for the 05/20/22 encounter (Appointment) with Ledora Bottcher, [place].     Allergies:   Ace inhibitors, Benazepril, Entresto [sacubitril-valsartan], Hctz [hydrochlorothiazide], and Aspirin   Social History   Socioeconomic History   Marital status: Divorced    Spouse name: Not on file   Number of children: 2   Years of education: college   Highest education level: Not on file  Occupational History    Employer: RETIRED  Tobacco Use   Smoking status: Never   Smokeless tobacco: Never  Vaping Use   Vaping Use: Never used  Substance and Sexual Activity   Alcohol use: No    Alcohol/week: 0.0 standard drinks of alcohol    Comment: "last drink was in 2012"( 03/15/2014)   Drug use: No   Sexual activity: Not Currently  Other Topics Concern   Not on file  Social History Narrative   ** Merged History Encounter **       Divorced, lives with a roommate. 1 son one daughter 3-4 caffeinated beverages daily Right-handed. 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   Social Determinants of Health   Financial Resource Strain: Low Risk  (02/11/2022)   Overall Financial Resource Strain (CARDIA)    Difficulty of Paying Living Expenses: Not very hard  Food Insecurity: No Food Insecurity (04/25/2022)   Hunger Vital Sign    Worried About Running Out of Food in the Last Year: Never  true    Ran Out of Food in the Last Year: Never true  Transportation Needs: No Transportation Needs (04/25/2022)   PRAPARE - Hydrologist (Medical): No    Lack of Transportation (Non-Medical): No  Physical Activity: Insufficiently Active (02/11/2022)   Exercise Vital Sign    Days of Exercise per Week: 5 days    Minutes of Exercise per Session: 10 min  Stress: Not on file  Social Connections: Moderately Isolated (09/06/2021)   Social Connection and Isolation Panel [NHANES]    Frequency of Communication with Friends and Family: More than three times a week    Frequency of Social Gatherings with Friends and Family: More than three times a week    Attends Religious Services: More than 4 times per year    Active Member of [employer] or Organizations: No    Attends Music therapist: Never    Marital Status: Divorced     Family History: The patient's ***family history includes Diabetes in his maternal grandmother; Heart attack in his paternal grandmother; Hypertension in his father; Pancreatic cancer in his mother; Stroke in his father and maternal grandmother. There is no history of Colon cancer, Esophageal cancer, Rectal cancer, Stomach cancer, or Ulcers.  ROS:   Please see the history of present illness.    *** All other systems reviewed and are negative.  EKGs/Labs/Other Studies Reviewed:    The following studies were reviewed today: ***  EKG:  EKG is *** ordered today.  The ekg ordered today  demonstrates ***  Recent Labs: 01/15/2022: Pro B Natriuretic peptide (BNP) 1,211.0 01/17/2022: B Natriuretic Peptide 969.2 01/23/2022: Magnesium 3.0 04/30/2022: ALT 71; BUN 31; Creatinine, Ser 1.67; Hemoglobin 12.4; Platelets 224.0; Potassium 4.5; Sodium 139  Recent Lipid Panel    Component Value Date/Time   CHOL 122 04/30/2022 1153   CHOL 162 11/23/2020 0828   TRIG 225.0 (H) 04/30/2022 1153   HDL 22.70 (L) 04/30/2022 1153   HDL 38 (L) 11/23/2020 0828   CHOLHDL 5 04/30/2022 1153   VLDL 45.0 (H) 04/30/2022 1153   LDLCALC 76 01/25/2022 1218   [place] 89 11/23/2020 0828   LDLCALC  04/18/2020 0956     Comment:     . LDL cholesterol not calculated. Triglyceride levels greater than 400 mg/dL invalidate calculated LDL results. . Reference range: <100 . Desirable range <100 mg/dL for primary prevention;   <70 mg/dL for patients with CHD or diabetic patients  with > or = 2 CHD risk factors. [name] LDL-C is now calculated using the Martin-Hopkins  calculation, which is a validated novel method providing  better accuracy than the Friedewald equation in the  estimation of LDL-C.  [name] et al. [name]. 4008;676(19): 2061-2068  (http://education.[employer].com/faq/FAQ164)    LDLDIRECT 70.0 04/30/2022 1153     Risk Assessment/Calculations:   {Does this patient have ATRIAL FIBRILLATION?[phone]}  No BP recorded.  {Refresh Note OR Click here to enter BP  :1}***         Physical Exam:    VS:  There were no vitals taken for this visit.    Wt Readings from Last 3 Encounters:  04/27/22 233 lb (105.7 kg)  04/17/22 234 lb (106.1 kg)  04/08/22 237 lb (107.5 kg)     GEN: *** Well nourished, well developed in no acute distress HEENT: Normal NECK: No JVD; No carotid bruits LYMPHATICS: No lymphadenopathy CARDIAC: ***RRR, no murmurs, rubs, gallops RESPIRATORY:  Clear to auscultation without rales, wheezing or rhonchi  ABDOMEN:  Soft, non-tender, non-distended MUSCULOSKELETAL:   No edema; No deformity  SKIN: Warm and dry NEUROLOGIC:  Alert and oriented x 3 PSYCHIATRIC:  Normal affect   ASSESSMENT:    No diagnosis found. PLAN:    In order of problems listed above:  ***      {Are you ordering a CV Procedure (e.g. stress test, cath, DCCV, TEE, etc)?   Press F2        :[id number]}    Medication Adjustments/Labs and Tests Ordered: Current medicines are reviewed at length with the patient today.  Concerns regarding medicines are outlined above.  No orders of the defined types were placed in this encounter.  No orders of the defined types were placed in this encounter.   There are no Patient Instructions on file for this visit.   Signed, Ledora Bottcher, [place]  05/08/2022 10:57 AM    [place]

## 2022-05-09 DIAGNOSIS — S81002D Unspecified open wound, left knee, subsequent encounter: Secondary | ICD-10-CM | POA: Diagnosis not present

## 2022-05-09 DIAGNOSIS — E1142 Type 2 diabetes mellitus with diabetic polyneuropathy: Secondary | ICD-10-CM | POA: Diagnosis not present

## 2022-05-09 DIAGNOSIS — I130 Hypertensive heart and chronic kidney disease with heart failure and stage 1 through stage 4 chronic kidney disease, or unspecified chronic kidney disease: Secondary | ICD-10-CM | POA: Diagnosis not present

## 2022-05-09 DIAGNOSIS — S81001D Unspecified open wound, right knee, subsequent encounter: Secondary | ICD-10-CM | POA: Diagnosis not present

## 2022-05-09 DIAGNOSIS — L03115 Cellulitis of right lower limb: Secondary | ICD-10-CM | POA: Diagnosis not present

## 2022-05-09 DIAGNOSIS — L988 Other specified disorders of the skin and subcutaneous tissue: Secondary | ICD-10-CM | POA: Diagnosis not present

## 2022-05-14 ENCOUNTER — Ambulatory Visit (HOSPITAL_COMMUNITY)
Admission: RE | Admit: 2022-05-14 | Discharge: 2022-05-14 | Disposition: A | Discharge status: Home / Self Care | Payer: Medicare Other | Source: Ambulatory Visit | Attending: Internal Medicine | Admitting: Internal Medicine

## 2022-05-14 ENCOUNTER — Encounter (HOSPITAL_BASED_OUTPATIENT_CLINIC_OR_DEPARTMENT_OTHER): Payer: Medicare Other | Attending: Internal Medicine | Admitting: Internal Medicine

## 2022-05-14 ENCOUNTER — Other Ambulatory Visit (HOSPITAL_COMMUNITY): Payer: Self-pay | Admitting: Internal Medicine

## 2022-05-14 DIAGNOSIS — L97812 Non-pressure chronic ulcer of other part of right lower leg with fat layer exposed: Secondary | ICD-10-CM | POA: Insufficient documentation

## 2022-05-14 DIAGNOSIS — E1169 Type 2 diabetes mellitus with other specified complication: Secondary | ICD-10-CM | POA: Insufficient documentation

## 2022-05-14 DIAGNOSIS — Z951 Presence of aortocoronary bypass graft: Secondary | ICD-10-CM | POA: Insufficient documentation

## 2022-05-14 DIAGNOSIS — L97509 Non-pressure chronic ulcer of other part of unspecified foot with unspecified severity: Secondary | ICD-10-CM | POA: Insufficient documentation

## 2022-05-14 DIAGNOSIS — Z9221 Personal history of antineoplastic chemotherapy: Secondary | ICD-10-CM | POA: Diagnosis not present

## 2022-05-14 DIAGNOSIS — L97518 Non-pressure chronic ulcer of other part of right foot with other specified severity: Secondary | ICD-10-CM | POA: Insufficient documentation

## 2022-05-14 DIAGNOSIS — E11621 Type 2 diabetes mellitus with foot ulcer: Secondary | ICD-10-CM | POA: Insufficient documentation

## 2022-05-14 DIAGNOSIS — I2510 Atherosclerotic heart disease of native coronary artery without angina pectoris: Secondary | ICD-10-CM | POA: Diagnosis not present

## 2022-05-14 DIAGNOSIS — E1142 Type 2 diabetes mellitus with diabetic polyneuropathy: Secondary | ICD-10-CM | POA: Insufficient documentation

## 2022-05-14 DIAGNOSIS — M1990 Unspecified osteoarthritis, unspecified site: Secondary | ICD-10-CM | POA: Insufficient documentation

## 2022-05-14 DIAGNOSIS — L97822 Non-pressure chronic ulcer of other part of left lower leg with fat layer exposed: Secondary | ICD-10-CM | POA: Diagnosis not present

## 2022-05-14 DIAGNOSIS — M869 Osteomyelitis, unspecified: Secondary | ICD-10-CM | POA: Insufficient documentation

## 2022-05-14 DIAGNOSIS — Z955 Presence of coronary angioplasty implant and graft: Secondary | ICD-10-CM | POA: Diagnosis not present

## 2022-05-14 DIAGNOSIS — G4730 Sleep apnea, unspecified: Secondary | ICD-10-CM | POA: Diagnosis not present

## 2022-05-14 DIAGNOSIS — I110 Hypertensive heart disease with heart failure: Secondary | ICD-10-CM | POA: Diagnosis not present

## 2022-05-14 DIAGNOSIS — I509 Heart failure, unspecified: Secondary | ICD-10-CM | POA: Diagnosis not present

## 2022-05-14 DIAGNOSIS — M7989 Other specified soft tissue disorders: Secondary | ICD-10-CM | POA: Diagnosis not present

## 2022-05-15 ENCOUNTER — Other Ambulatory Visit: Payer: Self-pay | Admitting: Family Medicine

## 2022-05-15 NOTE — Progress Notes (Signed)
RAMELLO, CORDIAL ([id number]) [phone].pdf Page 1 of 13 Visit Report for 05/14/2022 Allergy List Details Patient Name: Date of Service: Richard [name] [name] 05/14/2022 8:00 A M Medical Record Number: [id number] Patient Account Number: [id number] Date of Birth/Sex: Treating RN: [birth year] (81 y.o. [name] Primary Care Nyiah Pianka: Roma Schanz Other Clinician: Referring Quintana Canelo: Treating Esten Dollar/Extender: Tharon Aquas in Treatment: 0 Allergies Active Allergies benazepril hydrochlorothiazide aspirin lactose ACE Inhibitors Reaction: swelling Entresto Reaction: swelling Allergy Notes Electronic Signature(s) Signed: 05/14/2022 5:53:09 PM By: Deon Pilling RN, BSN Entered By: Deon Pilling on 05/13/2022 17:04:13 -------------------------------------------------------------------------------- Arrival Information Details Patient Name: Date of Service: Richard [name], Richard [name] [name] 05/14/2022 8:00 A M Medical Record Number: [id number] Patient Account Number: [id number] Date of Birth/Sex: Treating RN: Nov 02, 1940 (81 y.o. [name], [name] Primary Care Teva Bronkema: Roma Schanz Other Clinician: Referring Taiquan Campanaro: Treating Tosha Belgarde/Extender: Tharon Aquas in Treatment: 0 Visit Information Patient Arrived: Ambulatory Arrival Time: 08:09 Accompanied By: sister Transfer Assistance: None Patient Identification Verified: Yes Secondary Verification Process Completed: Yes Patient Requires Transmission-Based Precautions: No Patient Has Alerts: Yes Patient Alerts: Patient on Blood Thinner History Since Last Visit Added or deleted any medications: No Any new allergies or adverse reactions: No Had a fall or experienced change in activities of daily living that may affect risk of falls: No Signs or symptoms of abuse/neglect since last visito No Hospitalized since last visit: Yes Implantable device  outside of the clinic excluding cellular tissue based products placed in the center since last visit: No Pain Present Now: No Electronic Signature(s) ETHON, WYMER ([id number]) [id number] Page 2 of 13 Signed: 05/14/2022 5:53:09 PM By: Deon Pilling RN, BSN Entered By: Deon Pilling on 05/14/2022 08:12:23 -------------------------------------------------------------------------------- Clinic Level of Care Assessment Details Patient Name: Date of Service: Richard [name] [name] 05/14/2022 8:00 A M Medical Record Number: [id number] Patient Account Number: [id number] Date of Birth/Sex: Treating RN: 03-Apr-1941 (81 y.o. [name] Primary Care Natara Monfort: Roma Schanz Other Clinician: Referring Maloni Musleh: Treating Turner Baillie/Extender: Tharon Aquas in Treatment: 0 Clinic Level of Care Assessment Items TOOL 1 Quantity Score X- 1 0 Use when EandM and Procedure is performed on INITIAL visit ASSESSMENTS - Nursing Assessment / Reassessment X- 1 20 General Physical Exam (combine w/ comprehensive assessment (listed just below) when performed on new pt. evals) X- 1 25 Comprehensive Assessment (HX, ROS, Risk Assessments, Wounds Hx, etc.) ASSESSMENTS - Wound and Skin Assessment / Reassessment _0  - 0 Dermatologic / Skin Assessment (not related to wound area) ASSESSMENTS - Ostomy and/or Continence Assessment and Care _1  - 0 Incontinence Assessment and Management _2  - 0 Ostomy Care Assessment and Management (repouching, etc.) PROCESS - Coordination of Care _3  - 0 Simple Patient / Family Education for ongoing care X- 1 20 Complex (extensive) Patient / Family Education for ongoing care X- 1 10 Staff obtains Programmer, systems, Records, T Results / Process Orders est X- 1 10 Staff telephones HHA, Nursing Homes / Clarify orders / etc _4  - 0 Routine Transfer to another Facility (non-emergent condition) _5  - 0 Routine Hospital Admission  (non-emergent condition) X- 1 15 New Admissions / Biomedical engineer / Ordering NPWT Apligraf, etc. , _6  - 0 Emergency Hospital Admission (emergent condition) PROCESS - Special Needs _7  - 0 Pediatric / Minor Patient Management _8  - 0 Isolation Patient Management _9  - 0 Hearing / Language / Visual special needs _10  - 0 Assessment of Community assistance (transportation, D/C planning, etc.) _11  - 0 Additional assistance /  Altered mentation _0  - 0 Support Surface(s) Assessment (bed, cushion, seat, etc.) INTERVENTIONS - Miscellaneous _1  - 0 External ear exam _2  - 0 Patient Transfer (multiple staff / Civil Service fast streamer / Similar devices) _3  - 0 Simple Staple / Suture removal (25 or less) _4  - 0 Complex Staple / Suture removal (26 or more) _5  - 0 Hypo/Hyperglycemic Management (do not check if billed separately) X- 1 15 Ankle / Brachial Index (ABI) - do not check if billed separately DIANGELO, RADEL ([id number]) [id number] Page 3 of 13 Has the patient been seen at the hospital within the last three years: Yes Total Score: 115 Level Of Care: New/Established - Level 3 Electronic Signature(s) Signed: 05/14/2022 5:53:09 PM By: Deon Pilling RN, BSN Entered By: Deon Pilling on 05/14/2022 09:22:32 -------------------------------------------------------------------------------- Encounter Discharge Information Details Patient Name: Date of Service: Richard [name], Richard [name] [name] 05/14/2022 8:00 A M Medical Record Number: [id number] Patient Account Number: [id number] Date of Birth/Sex: Treating RN: 19-Mar-1941 (81 y.o. [name] Primary Care Leinaala Catanese: Roma Schanz Other Clinician: Referring Hosanna Betley: Treating Layne Dilauro/Extender: Tharon Aquas in Treatment: 0 Encounter Discharge Information Items Post Procedure Vitals Discharge Condition: Stable Temperature (F): 97.8 Ambulatory Status: Ambulatory Pulse (bpm): 73 Discharge  Destination: Home Respiratory Rate (breaths/min): 20 Transportation: Private Auto Blood Pressure (mmHg): 118/66 Accompanied By: sister Schedule Follow-up Appointment: Yes Clinical Summary of Care: Electronic Signature(s) Signed: 05/14/2022 5:53:09 PM By: Deon Pilling RN, BSN Entered By: Deon Pilling on 05/14/2022 09:23:16 -------------------------------------------------------------------------------- Lower Extremity Assessment Details Patient Name: Date of Service: Richard [name], Richard [name] [name] 05/14/2022 8:00 A M Medical Record Number: [id number] Patient Account Number: [id number] Date of Birth/Sex: Treating RN: [birth year] (81 y.o. [name] Primary Care Olusegun Gerstenberger: Roma Schanz Other Clinician: Referring Marua Qin: Treating Tajuana Kniskern/Extender: Tharon Aquas in Treatment: 0 Edema Assessment Assessed: [Left: No] [Right: Yes] Edema: [Left: Ye] [Right: s] Calf Left: Right: Point of Measurement: 35 cm From Medial Instep 39 cm Ankle Left: Right: Point of Measurement: 14 cm From Medial Instep 23.5 cm Knee To Floor Left: Right: From Medial Instep 48 cm Vascular Assessment Left: [[id number] Page 4 of 13Right:] Pulses: Dorsalis Pedis Palpable: [[id number] Page 4 of 13Yes] Doppler Audible: [[id number] Page 4 of 13Yes] Posterior Tibial Palpable: [[id number] Page 4 of 13Yes] Doppler Audible: [[id number] Page 4 of 13Yes] Blood Pressure: Brachial: [[id number].pdf Page 4 of 13118] Ankle: [[id number].pdf Page 4 of 13Dorsalis Pedis: 158 1.34] Electronic Signature(s) Signed: 05/14/2022 5:53:09 PM By: Deon Pilling RN, BSN Entered By: Deon Pilling on 05/14/2022 08:27:38 -------------------------------------------------------------------------------- Multi Wound Chart  Details Patient Name: Date of Service: Richard [name], Richard [name] [name] 05/14/2022 8:00 A M Medical Record Number: [id number] Patient Account Number: [id number] Date of Birth/Sex: Treating RN: 06-22-1941 (81 y.o. M) Primary Care Londan Coplen: Roma Schanz Other Clinician: Referring Kishon Garriga: Treating Alitzel Cookson/Extender: Tharon Aquas in Treatment: 0 Vital Signs Height(in): 72 Capillary Blood Glucose(mg/dl): 180 Weight(lbs): 226 Pulse(bpm): 84 Body Mass Index(BMI): 30.6 Blood Pressure(mmHg): 118/66 Temperature(F): 97.8 Respiratory Rate(breaths/min): 20 [5:Photos:] Right Knee Left, Medial Upper Leg Right, Plantar Foot Wound Location: Skin Tear/Laceration Skin Tear/Laceration Gradually Appeared Wounding Event: Abrasion Abrasion Diabetic Wound/Ulcer of the Lower Primary Etiology: Extremity Cataracts, Anemia, Sleep Apnea, Cataracts, Anemia, Sleep Apnea, Cataracts, Anemia, Sleep Apnea, Comorbid History: Arrhythmia, Congestive Heart Failure, Arrhythmia, Congestive Heart Failure, Arrhythmia, Congestive Heart Failure, Coronary Artery Disease, Coronary Artery Disease, Coronary Artery Disease, Hypertension, Type II Diabetes, Hypertension, Type II Diabetes, Hypertension, Type II Diabetes, Osteoarthritis, Neuropathy, Received Osteoarthritis, Neuropathy, Received Osteoarthritis, Neuropathy, Received Chemotherapy Chemotherapy  Chemotherapy 03/28/2022 03/27/2022 03/31/2022 Date Acquired: 0 0 0 Weeks of Treatment: Open Open Open Wound Status: No No No Wound Recurrence: 2.8x1.5x0.1 1.5x1.7x0.1 0.2x0.2x0.7 Measurements L x W x D (cm) 3.299 2.003 0.031 A (cm) : rea 0.33 0.2 0.022 Volume (cm) : 6 Position 1 (o'clock): 1.2 Maximum Distance 1 (cm): No No Yes Tunneling: Full Thickness Without Exposed Full Thickness Without Exposed Grade 2 Classification: Support Structures Support Structures Medium Medium Medium Exudate Amount: Serosanguineous Serosanguineous  Serosanguineous Exudate Type: red, brown red, brown red, brown Exudate Color: SABURO, LUGER ([id number]) [phone].pdf Page 5 of 13 Large (67-100%) Large (67-100%) Large (67-100%) Granulation Amount: Red Red Red Granulation Quality: Small (1-33%) Small (1-33%) Small (1-33%) Necrotic Amount: Eschar, Adherent Slough Adherent [facility], Adherent Slough Necrotic Tissue: Fat Layer (Subcutaneous Tissue): Yes Fat Layer (Subcutaneous Tissue): Yes Fat Layer (Subcutaneous Tissue): Yes Exposed Structures: Fascia: No Fascia: No Fascia: No Tendon: No Tendon: No Tendon: No Muscle: No Muscle: No Muscle: No Joint: No Joint: No Joint: No Bone: No Bone: No Bone: No None None None Epithelialization: N/A N/A Debridement - Selective/Open Wound Debridement: Pre-procedure Verification/Time Out N/A N/A 09:15 Taken: N/A N/A Lidocaine 4% Topical Solution Pain Control: N/A N/A Skin/Epidermis Level: N/A N/A 4 Debridement A (sq cm): rea N/A N/A Forceps, Scissors Instrument: N/A N/A None Bleeding: N/A N/A 0 Procedural Pain: N/A N/A 0 Post Procedural Pain: N/A N/A Procedure was tolerated well Debridement Treatment Response: N/A N/A 0.2x0.2x0.7 Post Debridement Measurements L x W x D (cm) N/A N/A 0.022 Post Debridement Volume: (cm) Callus: Yes Periwound Skin Texture: Dry/Scaly: Yes No Abnormalities Noted Dry/Scaly: Yes Periwound Skin Moisture: No Abnormality No Abnormality No Abnormality Temperature: N/A N/A Debridement Procedures Performed: Treatment Notes Wound #5 (Knee) Wound Laterality: Right Cleanser Soap and Water Discharge Instruction: May shower and wash wound with dial antibacterial soap and water prior to dressing change. Peri-Wound Care Skin Prep Discharge Instruction: Use skin prep as directed Topical Primary Dressing Xeroform Occlusive Gauze Dressing, 4x4 in Discharge Instruction: Apply to wound bed as instructed Secondary  Dressing Zetuvit Plus Silicone Border Dressing 4x4 (in/in) Discharge Instruction: Apply silicone border over primary dressing as directed. Secured With Compression Wrap Compression Stockings Add-Ons Wound #6 (Upper Leg) Wound Laterality: Left, Medial Cleanser Soap and Water Discharge Instruction: May shower and wash wound with dial antibacterial soap and water prior to dressing change. Peri-Wound Care Skin Prep Discharge Instruction: Use skin prep as directed Topical Primary Dressing Xeroform Occlusive Gauze Dressing, 4x4 in Discharge Instruction: Apply to wound bed as instructed Secondary Dressing Zetuvit Plus Silicone Border Dressing 4x4 (in/in) Discharge Instruction: Apply silicone border over primary dressing as directed. KASEAN, DENHERDER ([id number]) [phone].pdf Page 6 of 13 Secured With Compression Wrap Compression Stockings Add-Ons Wound #7 (Foot) Wound Laterality: Plantar, Right Cleanser Wound Cleanser Discharge Instruction: Cleanse the wound with wound cleanser prior to applying a clean dressing using gauze sponges, not tissue or cotton balls. Peri-Wound Care Topical Primary Dressing KerraCel Ag Gelling Fiber Dressing, 2x2 in (silver alginate) Discharge Instruction: Apply silver alginate -DO NOT PACK! Secondary Dressing Optifoam Non-Adhesive Dressing, 4x4 in Discharge Instruction: Apply a foam donut over the alignate Ag. Woven Gauze Sponges 2x2 in Discharge Instruction: Apply over primary dressing as directed. Secured With 66M Medipore H Soft Cloth Surgical T ape, 4 x 10 (in/yd) Discharge Instruction: Secure with tape as directed. Compression Wrap Compression Stockings Add-Ons Electronic Signature(s) Signed: 05/14/2022 2:06:49 PM By: Kalman Shan DO Entered By: Kalman Shan on 05/14/2022 09:56:53 -------------------------------------------------------------------------------- Multi-Disciplinary Care Plan Details  Patient Name:  Date of Service: Richard [name] [name] 05/14/2022 8:00 A M Medical Record Number: [id number] Patient Account Number: [id number] Date of Birth/Sex: Treating RN: 12-23-1940 (81 y.o. [name] Primary Care Quintan Saldivar: Roma Schanz Other Clinician: Referring Cathlyn Tersigni: Treating Homero Hyson/Extender: Tharon Aquas in Treatment: 0 Active Inactive Orientation to the Wound Care Program Nursing Diagnoses: Knowledge deficit related to the wound healing center program Goals: Patient/caregiver will verbalize understanding of the [place] Date Initiated: 05/14/2022 Target Resolution Date: 06/07/2022 Goal Status: Active Interventions: Provide education on orientation to the wound center MARLON, SULEIMAN ([id number]) [id number] Page 7 of 13 Notes: Pain, Acute or Chronic Nursing Diagnoses: Pain, acute or chronic: actual or potential Goals: Patient will verbalize adequate pain control and receive pain control interventions during procedures as needed Date Initiated: 05/14/2022 Target Resolution Date: 06/06/2022 Goal Status: Active Patient/caregiver will verbalize comfort level met Date Initiated: 05/14/2022 Target Resolution Date: 06/06/2022 Goal Status: Active Interventions: Assess comfort goal upon admission Provide education on pain management Treatment Activities: Administer pain control measures as ordered : 05/14/2022 Notes: Wound/Skin Impairment Nursing Diagnoses: Knowledge deficit related to ulceration/compromised skin integrity Goals: Patient/caregiver will verbalize understanding of skin care regimen Date Initiated: 05/14/2022 Target Resolution Date: 06/07/2022 Goal Status: Active Interventions: Assess patient/caregiver ability to perform ulcer/skin care regimen upon admission and as needed Assess ulceration(s) every visit Provide education on ulcer and skin care Treatment Activities: Skin care  regimen initiated : 05/14/2022 Topical wound management initiated : 05/14/2022 Notes: Electronic Signature(s) Signed: 05/14/2022 5:53:09 PM By: Deon Pilling RN, BSN Entered By: Deon Pilling on 05/14/2022 09:08:45 -------------------------------------------------------------------------------- Pain Assessment Details Patient Name: Date of Service: Richard [name], Richard [name] [name] 05/14/2022 8:00 A M Medical Record Number: [id number] Patient Account Number: [id number] Date of Birth/Sex: Treating RN: [birth year] (81 y.o. [name] Primary Care Jonee Lamore: Roma Schanz Other Clinician: Referring Marae Cottrell: Treating Amarria Andreasen/Extender: Tharon Aquas in Treatment: 0 Active Problems Location of Pain Severity and Description of Pain Patient Has Paino No Site Locations Rate the pain. AKITO, BOOMHOWER ([id number]) [phone].pdf Page 8 of 13 Rate the pain. Current Pain Level: 0 Pain Management and Medication Current Pain Management: Medication: No Cold Application: No Rest: No Massage: No Activity: No T.E.N.S.: No Heat Application: No Leg drop or elevation: No Is the Current Pain Management Adequate: Adequate How does your wound impact your activities of daily livingo Sleep: No Bathing: No Appetite: No Relationship With Others: No Bladder Continence: No Emotions: No Bowel Continence: No Work: No Toileting: No Drive: No Dressing: No Hobbies: No Engineer, maintenance) Signed: 05/14/2022 5:53:09 PM By: Deon Pilling RN, BSN Entered By: Deon Pilling on 05/14/2022 08:12:45 -------------------------------------------------------------------------------- Patient/Caregiver Education Details Patient Name: Date of Service: Gavin Potters 11/14/2023andnbsp8:00 A M Medical Record Number: [id number] Patient Account Number: [id number] Date of Birth/Gender: Treating RN: October 25, 1940 (81 y.o. [name] Primary Care Physician:  Roma Schanz Other Clinician: Referring Physician: Treating Physician/Extender: Tharon Aquas in Treatment: 0 Education Assessment Education Provided To: Patient Education Topics Provided Welcome T [facility] [place]: o Handouts: Welcome T [facility] o Methods: Explain/Verbal Responses: Reinforcements needed Electronic Signature(s) Signed: 05/14/2022 5:53:09 PM By: Deon Pilling RN, BSN Entered By: Deon Pilling on 05/14/2022 09:08:55 [name] ([id number]) [id number] Page 9 of 13 -------------------------------------------------------------------------------- Wound Assessment Details Patient Name: Date of Service: Richard [name] [name] 05/14/2022 8:00 A M Medical Record Number: [id number] Patient Account Number: [id number] Date of  Birth/Sex: Treating RN: Apr 04, 1941 (81 y.o. Mare Ferrari Primary Care Aralyn Nowak: Roma Schanz Other Clinician: Referring Nakiyah Beverley: Treating Marguita Venning/Extender: Tharon Aquas in Treatment: 0 Wound Status Wound Number: 5 Primary Abrasion Etiology: Wound Location: Right Knee Wound Open Wounding Event: Skin Tear/Laceration Status: Date Acquired: 03/28/2022 Comorbid Cataracts, Anemia, Sleep Apnea, Arrhythmia, Congestive Heart Weeks Of Treatment: 0 History: Failure, Coronary Artery Disease, Hypertension, Type II Diabetes, Clustered Wound: No Osteoarthritis, Neuropathy, Received Chemotherapy Photos Wound Measurements Length: (cm) 2.8 Width: (cm) 1.5 Depth: (cm) 0.1 Area: (cm) 3.299 Volume: (cm) 0.33 % Reduction in Area: % Reduction in Volume: Epithelialization: None Tunneling: No Undermining: No Wound Description Classification: Full Thickness Without Exposed Suppor Exudate Amount: Medium Exudate Type: Serosanguineous Exudate Color: red, brown t Structures Foul Odor After Cleansing: No Slough/Fibrino Yes Wound  Bed Granulation Amount: Large (67-100%) Exposed Structure Granulation Quality: Red Fascia Exposed: No Necrotic Amount: Small (1-33%) Fat Layer (Subcutaneous Tissue) Exposed: Yes Necrotic Quality: Eschar, Adherent Slough Tendon Exposed: No Muscle Exposed: No Joint Exposed: No Bone Exposed: No Periwound Skin Texture Texture Color No Abnormalities Noted: No No Abnormalities Noted: No Moisture Temperature / Pain No Abnormalities Noted: No Temperature: No Abnormality Dry / Scaly: Yes Treatment Notes Wound #5 (Knee) Wound Laterality: Right Cleanser Soap and [address] KASHAUN, BEBO ([id number]) [phone].pdf Page 10 of 13 Discharge Instruction: May shower and wash wound with dial antibacterial soap and water prior to dressing change. Peri-Wound Care Skin Prep Discharge Instruction: Use skin prep as directed Topical Primary Dressing Xeroform Occlusive Gauze Dressing, 4x4 in Discharge Instruction: Apply to wound bed as instructed Secondary Dressing Zetuvit Plus Silicone Border Dressing 4x4 (in/in) Discharge Instruction: Apply silicone border over primary dressing as directed. Secured With Compression Wrap Compression Stockings Environmental education officer) Signed: 05/15/2022 8:21:58 AM By: Sharyn Creamer RN, BSN Entered By: Sharyn Creamer on 05/14/2022 08:31:16 -------------------------------------------------------------------------------- Wound Assessment Details Patient Name: Date of Service: Richard [name], Richard [name] [name] 05/14/2022 8:00 A M Medical Record Number: [id number] Patient Account Number: [id number] Date of Birth/Sex: Treating RN: Jun 24, 1941 (81 y.o. Mare Ferrari Primary Care Julane Crock: Roma Schanz Other Clinician: Referring Brylen Wagar: Treating Klaudia Beirne/Extender: Tharon Aquas in Treatment: 0 Wound Status Wound Number: 6 Primary Abrasion Etiology: Wound Location: Left, Medial Upper Leg Wound Open Wounding  Event: Skin Tear/Laceration Status: Date Acquired: 03/27/2022 Comorbid Cataracts, Anemia, Sleep Apnea, Arrhythmia, Congestive Heart Weeks Of Treatment: 0 History: Failure, Coronary Artery Disease, Hypertension, Type II Diabetes, Clustered Wound: No Osteoarthritis, Neuropathy, Received Chemotherapy Photos Wound Measurements Length: (cm) 1.5 Width: (cm) 1.7 Depth: (cm) 0.1 Area: (cm) 2.003 Volume: (cm) 0.2 % Reduction in Area: % Reduction in Volume: Epithelialization: None Tunneling: No Undermining: No Wound Description DACODA, FINLAY ([id number]) Classification: Full Thickness Without Exposed Support Structures Exudate Amount: Medium Exudate Type: Serosanguineous Exudate Color: red, brown [id number] Page 11 of 13 Foul Odor After Cleansing: No Slough/Fibrino Yes Wound Bed Granulation Amount: Large (67-100%) Exposed Structure Granulation Quality: Red Fascia Exposed: No Necrotic Amount: Small (1-33%) Fat Layer (Subcutaneous Tissue) Exposed: Yes Necrotic Quality: Adherent Slough Tendon Exposed: No Muscle Exposed: No Joint Exposed: No Bone Exposed: No Periwound Skin Texture Texture Color No Abnormalities Noted: No No Abnormalities Noted: No Moisture Temperature / Pain No Abnormalities Noted: Yes Temperature: No Abnormality Treatment Notes Wound #6 (Upper Leg) Wound Laterality: Left, Medial Cleanser Soap and Water Discharge Instruction: May shower and wash wound with dial antibacterial soap and water prior to dressing change. Peri-Wound Care Skin Prep Discharge Instruction: Use skin prep as directed Topical  Primary Dressing Xeroform Occlusive Gauze Dressing, 4x4 in Discharge Instruction: Apply to wound bed as instructed Secondary Dressing Zetuvit Plus Silicone Border Dressing 4x4 (in/in) Discharge Instruction: Apply silicone border over primary dressing as directed. Secured With Compression Wrap Compression  Stockings Environmental education officer) Signed: 05/15/2022 8:21:58 AM By: Sharyn Creamer RN, BSN Entered By: Sharyn Creamer on 05/14/2022 08:34:20 -------------------------------------------------------------------------------- Wound Assessment Details Patient Name: Date of Service: Richard [name], Richard [name] [name] 05/14/2022 8:00 A M Medical Record Number: [id number] Patient Account Number: [id number] Date of Birth/Sex: Treating RN: January 10, 1941 (81 y.o. Mare Ferrari Primary Care Khaden Gater: Roma Schanz Other Clinician: Referring Pattiann Solanki: Treating Sharmaine Bain/Extender: Tharon Aquas in Treatment: 0 Wound Status Wound Number: 7 Primary Diabetic Wound/Ulcer of the Lower Extremity Etiology: Wound Location: Right, Plantar Foot Wound Open Wounding Event: Gradually Appeared Status: JERMANI, EBERLEIN ([id number]) [id number] Page 12 of 13 Status: Date Acquired: 03/31/2022 Comorbid Cataracts, Anemia, Sleep Apnea, Arrhythmia, Congestive Heart Weeks Of Treatment: 0 History: Failure, Coronary Artery Disease, Hypertension, Type II Diabetes, Clustered Wound: No Osteoarthritis, Neuropathy, Received Chemotherapy Photos Wound Measurements Length: (cm) 0.2 Width: (cm) 0.2 Depth: (cm) 0.7 Area: (cm) 0.031 Volume: (cm) 0.022 % Reduction in Area: % Reduction in Volume: Epithelialization: None Tunneling: Yes Position (o'clock): 6 Maximum Distance: (cm) 1.2 Undermining: No Wound Description Classification: Grade 2 Exudate Amount: Medium Exudate Type: Serosanguineous Exudate Color: red, brown Foul Odor After Cleansing: No Slough/Fibrino Yes Wound Bed Granulation Amount: Large (67-100%) Exposed Structure Granulation Quality: Red Fascia Exposed: No Necrotic Amount: Small (1-33%) Fat Layer (Subcutaneous Tissue) Exposed: Yes Necrotic Quality: Eschar, Adherent Slough Tendon Exposed: No Muscle Exposed: No Joint Exposed: No Bone Exposed:  No Periwound Skin Texture Texture Color No Abnormalities Noted: No No Abnormalities Noted: No Callus: Yes Temperature / Pain Temperature: No Abnormality Moisture No Abnormalities Noted: No Dry / Scaly: Yes Treatment Notes Wound #7 (Foot) Wound Laterality: Plantar, Right Cleanser Wound Cleanser Discharge Instruction: Cleanse the wound with wound cleanser prior to applying a clean dressing using gauze sponges, not tissue or cotton balls. Peri-Wound Care Topical Primary Dressing KerraCel Ag Gelling Fiber Dressing, 2x2 in (silver alginate) Discharge Instruction: Apply silver alginate -DO NOT PACK! Secondary Dressing Optifoam Non-Adhesive Dressing, 4x4 in Discharge Instruction: Apply a foam donut over the alignate Ag. Woven Gauze Sponges 2x2 in Discharge Instruction: Apply over primary dressing as directed. EMRY, BARBATO ([id number]) [id number].pdf Page 13 of 13 Secured With 26M Medipore [employer] T ape, 4 x 10 (in/yd) Discharge Instruction: Secure with tape as directed. Compression Wrap Compression Stockings Add-Ons Electronic Signature(s) Signed: 05/15/2022 8:21:58 AM By: Sharyn Creamer RN, BSN Entered By: Sharyn Creamer on 05/14/2022 08:37:34 -------------------------------------------------------------------------------- Vitals Details Patient Name: Date of Service: Richard [name], [name] 05/14/2022 8:00 A M Medical Record Number: [id number] Patient Account Number: [id number] Date of Birth/Sex: Treating RN: May 14, 1941 (81 y.o. [name] Primary Care Dolores Mcgovern: Roma Schanz Other Clinician: Referring Katriel Cutsforth: Treating Akeila Lana/Extender: Tharon Aquas in Treatment: 0 Vital Signs Time Taken: 08:12 Temperature (F): 97.8 Height (in): 72 Pulse (bpm): 73 Source: Stated Respiratory Rate (breaths/min): 20 Weight (lbs): 226 Blood Pressure (mmHg): 118/66 Source: Stated Capillary Blood Glucose  (mg/dl): 180 Body Mass Index (BMI): 30.6 Reference Range: 80 - 120 mg / dl Electronic Signature(s) Signed: 05/14/2022 5:53:09 PM By: Deon Pilling RN, BSN Entered By: Deon Pilling on 05/14/2022 08:17:31

## 2022-05-15 NOTE — Progress Notes (Signed)
GERMAIN, KOOPMANN ([id number]) [id number] Page 1 of 4 Visit Report for 05/14/2022 Abuse Risk Screen Details Patient Name: Date of Service: Richard [name] [name] 05/14/2022 8:00 A M Medical Record Number: [id number] Patient Account Number: [id number] Date of Birth/Sex: Treating RN: 05/11/1941 (81 y.o. Richard [name] Primary Care Oluwaseyi Raffel: Roma Schanz Other Clinician: Referring Devaunte Gasparini: Treating Richard [name]/Extender: Tharon Aquas in Treatment: 0 Abuse Risk Screen Items Answer ABUSE RISK SCREEN: Has anyone close to you tried to hurt or harm you recentlyo No Do you feel uncomfortable with anyone in your familyo No Has anyone forced you do things that you didnt want to doo No Electronic Signature(s) Signed: 05/14/2022 5:53:09 PM By: Deon Pilling RN, BSN Entered By: Deon Pilling on 05/14/2022 08:14:32 -------------------------------------------------------------------------------- Activities of Daily Living Details Patient Name: Date of Service: Richard [name] [name] 05/14/2022 8:00 A M Medical Record Number: [id number] Patient Account Number: [id number] Date of Birth/Sex: Treating RN: Jun 08, 1941 (81 y.o. Richard [name] Primary Care Curstin Schmale: Roma Schanz Other Clinician: Referring Kailo Kosik: Treating Jax Kentner/Extender: Tharon Aquas in Treatment: 0 Activities of Daily Living Items Answer Activities of Daily Living (Please select one for each item) Drive Automobile Not Able T Medications ake Completely Able Use T elephone Completely Able Care for Appearance Completely Able Use T oilet Completely Able Bath / Shower Completely Able Dress Self Completely Able Feed Self Completely Able Walk Completely Able Get In / Out Bed Completely Able Housework Completely Able Prepare Meals Completely Able Handle Money Completely Able Shop for Self Completely Able Electronic  Signature(s) Signed: 05/14/2022 5:53:09 PM By: Deon Pilling RN, BSN Entered By: Deon Pilling on 05/14/2022 08:14:45 Richard [name] ([id number]) [id number] Page 2 of 4 -------------------------------------------------------------------------------- Education Screening Details Patient Name: Date of Service: Richard [name] [name] 05/14/2022 8:00 A M Medical Record Number: [id number] Patient Account Number: [id number] Date of Birth/Sex: Treating RN: [birth year] (81 y.o. Richard [name] Primary Care Tavaris Eudy: Roma Schanz Other Clinician: Referring Daijah Scrivens: Treating Desma Wilkowski/Extender: Tharon Aquas in Treatment: 0 Primary Learner Assessed: Patient Learning Preferences/Education Level/Primary Language Learning Preference: Explanation, Demonstration, Printed Material Highest Education Level: College or Above Preferred Language: English Cognitive Barrier Language Barrier: No Translator Needed: No Memory Deficit: No Emotional Barrier: No Cultural/Religious Beliefs Affecting Medical Care: No Physical Barrier Impaired Vision: No Impaired Hearing: No Decreased Hand dexterity: No Knowledge/Comprehension Knowledge Level: High Comprehension Level: High Ability to understand written instructions: High Ability to understand verbal instructions: High Motivation Anxiety Level: Calm Cooperation: Cooperative Education Importance: Acknowledges Need Interest in Health Problems: Asks Questions Perception: Coherent Willingness to Engage in Self-Management High Activities: Readiness to Engage in Self-Management High Activities: Electronic Signature(s) Signed: 05/14/2022 5:53:09 PM By: Deon Pilling RN, BSN Entered By: Deon Pilling on 05/14/2022 08:13:45 -------------------------------------------------------------------------------- Fall Risk Assessment Details Patient Name: Date of Service: Richard [name], [name] 05/14/2022  8:00 A M Medical Record Number: [id number] Patient Account Number: [id number] Date of Birth/Sex: Treating RN: 12-07-1940 (81 y.o. Richard [name] Primary Care Maylani Embree: Roma Schanz Other Clinician: Referring Richard [name]: Treating Chrishonda Hesch/Extender: Tharon Aquas in Treatment: 0 Fall Risk Assessment Items Have you had 2 or more falls in the last 42 Richard [name] monthso 0 Yes KASTIEL, SIMONIAN ([id number]) [id number] Page 3 of 4 Have you had any fall that resulted in injury in the last 12 monthso 0 Yes FALLS RISK SCREEN History of falling - immediate or within 3 months 25 Yes Secondary diagnosis (Do you have 2  or more medical diagnoseso) 0 No Ambulatory aid None/bed rest/wheelchair/nurse 0 Yes Crutches/cane/walker 0 No Furniture 0 No Intravenous therapy Access/Saline/Heparin Lock 0 No Gait/Transferring Normal/ bed rest/ wheelchair 0 Yes Weak (short steps with or without shuffle, stooped but able to lift head while walking, may seek 0 No support from furniture) Impaired (short steps with shuffle, may have difficulty arising from chair, head down, impaired 0 No balance) Mental Status Oriented to own ability 0 Yes Electronic Signature(s) Signed: 05/14/2022 5:53:09 PM By: Deon Pilling RN, BSN Entered By: Deon Pilling on 05/14/2022 08:13:56 -------------------------------------------------------------------------------- Foot Assessment Details Patient Name: Date of Service: Richard [name], [name] 05/14/2022 8:00 A M Medical Record Number: [id number] Patient Account Number: [id number] Date of Birth/Sex: Treating RN: 1941-05-10 (81 y.o. Richard [name] Primary Care Norton Bivins: Roma Schanz Other Clinician: Referring Patterson Hollenbaugh: Treating Richard [name]/Extender: Tharon Aquas in Treatment: 0 Foot Assessment Items Site Locations + = Sensation present, - = Sensation absent, C = Callus, U = Ulcer R = Redness,  W = Warmth, M = Maceration, PU = Pre-ulcerative lesion F = Fissure, S = Swelling, D = Dryness Assessment Right: Left: Other Deformity: No No Prior Foot Ulcer: No No Prior Amputation: No No Charcot Joint: No No Ambulatory Status: Ambulatory Without Help GaitNOBEL, BRAR ([id number]) [id number] Page 4 of 4 Electronic Signature(s) Signed: 05/14/2022 5:53:09 PM By: Deon Pilling RN, BSN Entered By: Deon Pilling on 05/14/2022 08:28:52 -------------------------------------------------------------------------------- Nutrition Risk Screening Details Patient Name: Date of Service: Richard [name], [name] 05/14/2022 8:00 A M Medical Record Number: [id number] Patient Account Number: [id number] Date of Birth/Sex: Treating RN: [birth year] (81 y.o. Richard [name] Primary Care Aiza Vollrath: Roma Schanz Other Clinician: Referring Shaleen Talamantez: Treating Nocholas Damaso/Extender: Tharon Aquas in Treatment: 0 Height (in): 72 Weight (lbs): 235 Body Mass Index (BMI): 31.9 Nutrition Risk Screening Items Score Screening NUTRITION RISK SCREEN: I have an illness or condition that made me change the kind and/or amount of food I eat 2 Yes I eat fewer than two meals per day 0 No I eat few fruits and vegetables, or milk products 0 No I have three or more drinks of beer, liquor or wine almost every day 0 No I have tooth or mouth problems that make it hard for me to eat 0 No I don't always have enough money to buy the food I need 0 No I eat alone most of the time 0 No I take three or more different prescribed or over-the-counter drugs a day 1 Yes Without wanting to, I have lost or gained 10 pounds in the last six months 0 No I am not always physically able to shop, cook and/or feed myself 0 No Nutrition Protocols Good Risk Protocol Provide education on elevated blood Moderate Risk Protocol 0 sugars and impact on wound healing, as  applicable High Risk Proctocol Risk Level: Moderate Risk Score: 3 Electronic Signature(s) Signed: 05/14/2022 5:53:09 PM By: Deon Pilling RN, BSN Entered By: Deon Pilling on 05/14/2022 08:14:21

## 2022-05-15 NOTE — Progress Notes (Signed)
BRINLEY, TREANOR ([id number]) [id number] Page 1 of 11 Visit Report for 05/14/2022 Chief Complaint Document Details Patient Name: Date of Service: Richard [name] Richard L. 05/14/2022 8:00 A M Medical Record Number: [id number] Patient Account Number: [id number] Date of Birth/Sex: Treating RN: March 21, 1941 (81 y.o. M) Primary Care Provider: Roma Schanz Other Clinician: Referring Provider: Treating Provider/Extender: Tharon Aquas in Treatment: 0 Information Obtained from: Patient Chief Complaint 01/12/2021; Wounds to his left upper extremity and bilateral lower extremities. 05/14/2022; bilateral lower extremity wounds, right plantar foot wound Electronic Signature(s) Signed: 05/14/2022 2:06:49 PM By: Kalman Shan DO Entered By: Kalman Shan on 05/14/2022 09:57:39 -------------------------------------------------------------------------------- Debridement Details Patient Name: Date of Service: Richard [name], Richard [name] Richard L. 05/14/2022 8:00 A M Medical Record Number: [id number] Patient Account Number: [id number] Date of Birth/Sex: Treating RN: [birth year] (81 y.o. [name], Tammi Klippel Primary Care Provider: Roma Schanz Other Clinician: Referring Provider: Treating Provider/Extender: Tharon Aquas in Treatment: 0 Debridement Performed for Assessment: Wound #7 Right,Plantar Foot Performed By: Physician Kalman Shan, DO Debridement Type: Debridement Severity of Tissue Pre Debridement: Fat layer exposed Level of Consciousness (Pre-procedure): Awake and Alert Pre-procedure Verification/Time Out Yes - 09:15 Taken: Start Time: 09:16 Pain Control: Lidocaine 4% T opical Solution T Area Debrided (L x W): otal 2 (cm) x 2 (cm) = 4 (cm) Tissue and other material debrided: Viable, Non-Viable, Skin: Epidermis Level: Skin/Epidermis Debridement Description: Selective/Open Wound Instrument: Forceps,  Scissors Bleeding: None End Time: 09:21 Procedural Pain: 0 Post Procedural Pain: 0 Response to Treatment: Procedure was tolerated well Level of Consciousness (Post- Awake and Alert procedure): Post Debridement Measurements of Total Wound Length: (cm) 0.2 Width: (cm) 0.2 Depth: (cm) 0.7 Volume: (cm) 0.022 Character of Wound/Ulcer Post Debridement: Stable Severity of Tissue Post Debridement: Fat layer exposed Richard [name], Richard [name] ([id number]) [id number] Page 2 of 11 Post Procedure Diagnosis Same as Pre-procedure Electronic Signature(s) Signed: 05/14/2022 2:06:49 PM By: Kalman Shan DO Signed: 05/14/2022 5:53:09 PM By: Deon Pilling RN, BSN Entered By: Deon Pilling on 05/14/2022 09:22:04 -------------------------------------------------------------------------------- HPI Details Patient Name: Date of Service: Richard [name], Richard Richard L. 05/14/2022 8:00 A M Medical Record Number: [id number] Patient Account Number: [id number] Date of Birth/Sex: Treating RN: 1940-07-08 (81 y.o. M) Primary Care Provider: Roma Schanz Other Clinician: Referring Provider: Treating Provider/Extender: Tharon Aquas in Treatment: 0 History of Present Illness HPI Description: Admission 7/15 Richard [name] is an 80 year old male with a past medical history of type 2 diabetes on oral agents, diffuse large B-cell lymphoma, and coronary artery disease status post CABG that presents to the clinic for wounds to his left upper extremity and bilateral lower extremities. He states that he went to urgent care to be evaluated for a fever. And he was instructed to go to the ED to be further evaluated for possible sepsis. Unfortunately he passed out in his car After his appointment and was not discovered for another 3 hours. He was eventually discovered and EMS had to take him out of his vehicle. This is when he developed abrasions to his left arm and legs. He has  been using Xeroform to his wound beds every other day. He reports improvement to all the wounds except for the one on the back of his right leg. He denies pain or signs of infection. 7/22; patient presents for 1 week follow-up. He has no issues or complaints today. He has tolerated the compression wrap well on his right lower extremity. He has been able to do the  dressing changes without issues to his left leg and left arm. His left arm wound has healed. He denies signs of infection. 7/29; patient presents for 1 week follow-up. He reports that his left leg wound is healed. He tolerated the compression wrap well on his right leg. He has no issues or complaints today. He denies signs of infection. 8/4; patient presents for 1 week follow-up. He continues to see improvement in wound healing to his right lower extremity. He has no issues or complaints today. He denies signs of infection. He does state that he went to the dermatologist and had some lesions removed on his face and left leg. He has been keeping these covered with a Band-Aid. 8/19; patient presents for follow-up. He is tolerated the compression wrap well. He has no issues or complaints today. He now only has 1 remaining wound. 9/1; patient presents for 1 week follow-up. He has tolerated the compression wrap well and has no issues or complaints today. He has been golfing 3 times weekly. He is in good spirits today. 9/15; patient presents for follow-up. He has no issues or complaints today. The wound is closed. Readmission 05/14/2022 Richard [name] is an 81 year old male with a past medical history of uncontrolled type 2 diabetes on oral agents, diffuse large B-cell lymphoma and coronary artery disease status post CABG that presents the clinic for 3 wounds. He developed a right knee wound when he fell out of his golf cart 3 weeks ago. He developed increased warmth and erythema to the right leg. While he was still waiting in the ED on  04/24/2022 for this issue he dropped his phone and created a wound to his left thigh. He has been using Xeroform to these areas. He ultimately required admission from the ED for right lower extremity cellulitis and was treated with Zyvox and Keflex. He also has a right plantar foot wound that started after having callus debridement by his podiatrist. He has had this wound for at least 6 weeks. For the foot wound he has not been keeping it covered and walks around barefoot. He has peripheral neuropathy However states that the wound site is painful. He denies any purulent drainage. He has swelling to this area. Electronic Signature(s) Signed: 05/14/2022 2:06:49 PM By: Kalman Shan DO Entered By: Kalman Shan on 05/14/2022 10:26:03 -------------------------------------------------------------------------------- Physical Exam Details Patient Name: Date of Service: Richard [name], Richard [name] Richard L. 05/14/2022 8:00 A M Medical Record Number: [id number] Patient Account Number: [id number] Date of Birth/Sex: Treating RN: 30-May-1941 (81 y.o. Richard [name], Richard [name] ([id number]) [id number] Page 3 of 11 Primary Care Provider: Roma Schanz Other Clinician: Referring Provider: Treating Provider/Extender: Tharon Aquas in Treatment: 0 Constitutional respirations regular, non-labored and within target range for patient.. Cardiovascular 2+ dorsalis pedis/posterior tibialis pulses. Psychiatric pleasant and cooperative. Notes Right lower extremity: T the right knee there is an open wound with granulation tissue and epithelization to the surrounding edges. T the plantar foot at the fifth o o met head there is an open wound that has a depth of almost 1 cm and undermining circumferentially. This is swollen and erythematous. No purulent drainage. Left thigh with open wound and granulation tissue and epithelization to the edges. Electronic  Signature(s) Signed: 05/14/2022 2:06:49 PM By: Kalman Shan DO Entered By: Kalman Shan on 05/14/2022 10:26:19 -------------------------------------------------------------------------------- Physician Orders Details Patient Name: Date of Service: Richard [name], [place] L. 05/14/2022 8:00 A M Medical Record Number: [id number] Patient Account Number: [id number] Date of Birth/Sex: Treating  RN: April 30, 1941 (81 y.o. [name], Tammi Klippel Primary Care Provider: Roma Schanz Other Clinician: Referring Provider: Treating Provider/Extender: Tharon Aquas in Treatment: 0 Verbal / Phone Orders: No Diagnosis Coding ICD-10 Coding Code Description E11.621 Type 2 diabetes mellitus with foot ulcer L97.822 Non-pressure chronic ulcer of other part of left lower leg with fat layer exposed L97.812 Non-pressure chronic ulcer of other part of right lower leg with fat layer exposed L97.518 Non-pressure chronic ulcer of other part of right foot with other specified severity Follow-up Appointments ppointment in 1 week. - Dr. Heber Nescopeck Return A Other: - x-ray to have performed at any outpatient of any [place]. Pick up oral antibiotics today from your pharmacy. Anesthetic (In clinic) Topical Lidocaine 4% applied to wound bed Bathing/ Shower/ Hygiene May shower with protection but do not get wound dressing(s) wet. Edema Control - Lymphedema / SCD / Other Elevate legs to the level of the heart or above for 30 minutes daily and/or when sitting, a frequency of: - 3-4 times a day throughout the day. Avoid standing for long periods of time. Off-Loading Open toe surgical shoe to: - peg assist in shoe. while walking and standing wear the shoe. limit standing and walking. No bare feet or just socks always wear shoes when walking. Home Health New wound care orders this week; continue Home Health for wound care. May utilize formulary equivalent dressing for wound  treatment orders unless otherwise specified. - twice a week home health- left upper leg and right knee xeroform and foam border. Other Home Health Orders/Instructions: - [facility]. Wound Treatment Wound #5 - Knee Wound Laterality: Right Richard [name], Richard [name] ([id number]) [id number] Page 4 of 11 Cleanser: Soap and Water Every Other Day/30 Days Discharge Instructions: May shower and wash wound with dial antibacterial soap and water prior to dressing change. Peri-Wound Care: Skin Prep Every Other Day/30 Days Discharge Instructions: Use skin prep as directed Prim Dressing: Xeroform Occlusive Gauze Dressing, 4x4 in Every Other Day/30 Days ary Discharge Instructions: Apply to wound bed as instructed Secondary Dressing: Zetuvit Plus Silicone Border Dressing 4x4 (in/in) Every Other Day/30 Days Discharge Instructions: Apply silicone border over primary dressing as directed. Wound #6 - Upper Leg Wound Laterality: Left, Medial Cleanser: Soap and Water Every Other Day/30 Days Discharge Instructions: May shower and wash wound with dial antibacterial soap and water prior to dressing change. Peri-Wound Care: Skin Prep Every Other Day/30 Days Discharge Instructions: Use skin prep as directed Prim Dressing: Xeroform Occlusive Gauze Dressing, 4x4 in Every Other Day/30 Days ary Discharge Instructions: Apply to wound bed as instructed Secondary Dressing: Zetuvit Plus Silicone Border Dressing 4x4 (in/in) Every Other Day/30 Days Discharge Instructions: Apply silicone border over primary dressing as directed. Wound #7 - Foot Wound Laterality: Plantar, Right Cleanser: Wound Cleanser Every Other Day/30 Days Discharge Instructions: Cleanse the wound with wound cleanser prior to applying a clean dressing using gauze sponges, not tissue or cotton balls. Prim Dressing: KerraCel Ag Gelling Fiber Dressing, 2x2 in (silver alginate) Every Other Day/30 Days ary Discharge Instructions:  Apply silver alginate -DO NOT PACK! Secondary Dressing: Optifoam Non-Adhesive Dressing, 4x4 in Every Other Day/30 Days Discharge Instructions: Apply a foam donut over the alignate Ag. Secondary Dressing: Woven Gauze Sponges 2x2 in Every Other Day/30 Days Discharge Instructions: Apply over primary dressing as directed. Secured With: 78M Medipore H Soft Cloth Surgical T ape, 4 x 10 (in/yd) Every Other Day/30 Days Discharge Instructions: Secure with tape as directed. Radiology X-ray, foot right -  x-ray of right foot related to non-healing diabetic ulcer looking for abscess, infection, or osteomyelitis. CPT code - (ICD10 E11.621 - Type 2 diabetes mellitus with foot ulcer) Patient Medications llergies: benazepril, hydrochlorothiazide, aspirin, lactose, ACE Inhibitors, Entresto A Notifications Medication Indication Start End 05/14/2022 lidocaine DOSE topical 4 % cream - cream topical applied only in clinic for debridements. 05/14/2022 levofloxacin DOSE 1 - oral 750 mg tablet - 1 tablet oral daily x 7 days Electronic Signature(s) Signed: 05/14/2022 2:06:49 PM By: Kalman Shan DO Previous Signature: 05/14/2022 9:43:29 AM Version By: Kalman Shan DO Entered By: Kalman Shan on 05/14/2022 10:26:33 Prescription 05/14/2022 -------------------------------------------------------------------------------- Kirstie Mirza L. Kalman Shan DO Patient Name: Provider: 1941/02/28 [id number] Date of Birth: NPI#: AMARIS, GARRETTE ([id number]) [id number] Page 5 of 11 Jerilynn Mages [id number] Sex: DEA #: [phone] Phone #: License #: [place] Patient Address: [address] [place], [place] [address] [place], [place] [address] [phone] Allergies benazepril; hydrochlorothiazide; aspirin; lactose; ACE Inhibitors; Entresto Provider's Orders X-ray, foot right - ICD10: E11.621 - x-ray of right  foot related to non-healing diabetic ulcer looking for abscess, infection, or osteomyelitis. CPT code Hand Signature: Date(s): Electronic Signature(s) Signed: 05/14/2022 2:06:49 PM By: Kalman Shan DO Entered By: Kalman Shan on 05/14/2022 10:26:33 -------------------------------------------------------------------------------- Problem List Details Patient Name: Date of Service: Richard [name], Richard [name] Richard L. 05/14/2022 8:00 A M Medical Record Number: [id number] Patient Account Number: [id number] Date of Birth/Sex: Treating RN: 05-30-1941 (81 y.o. M) Primary Care Provider: Roma Schanz Other Clinician: Referring Provider: Treating Provider/Extender: Tharon Aquas in Treatment: 0 Active Problems ICD-10 Encounter Code Description Active Date MDM Diagnosis E11.621 Type 2 diabetes mellitus with foot ulcer 05/14/2022 No Yes L97.518 Non-pressure chronic ulcer of other part of right foot with other specified 05/14/2022 No Yes severity L97.822 Non-pressure chronic ulcer of other part of left lower leg with fat layer 05/14/2022 No Yes exposed L97.812 Non-pressure chronic ulcer of other part of right lower leg with fat layer 05/14/2022 No Yes exposed Inactive Problems Resolved Problems Electronic Signature(s) Signed: 05/14/2022 2:06:49 PM By: Kalman Shan DO Entered By: Kalman Shan on 05/14/2022 09:56:46 Richard [name] ([id number]) [id number] Page 6 of 11 -------------------------------------------------------------------------------- Progress Note Details Patient Name: Date of Service: Richard [name] Richard L. 05/14/2022 8:00 A M Medical Record Number: [id number] Patient Account Number: [id number] Date of Birth/Sex: Treating RN: Jan 09, 1941 (81 y.o. M) Primary Care Provider: Roma Schanz Other Clinician: Referring Provider: Treating Provider/Extender: Tharon Aquas in Treatment:  0 Subjective Chief Complaint Information obtained from Patient 01/12/2021; Wounds to his left upper extremity and bilateral lower extremities. 05/14/2022; bilateral lower extremity wounds, right plantar foot wound History of Present Illness (HPI) Admission 7/15 Richard [name] is an 81 year old male with a past medical history of type 2 diabetes on oral agents, diffuse large B-cell lymphoma, and coronary artery disease status post CABG that presents to the clinic for wounds to his left upper extremity and bilateral lower extremities. He states that he went to urgent care to be evaluated for a fever. And he was instructed to go to the ED to be further evaluated for possible sepsis. Unfortunately he passed out in his car After his appointment and was not discovered for another 3 hours. He was eventually discovered and EMS had to take him out of his vehicle. This is when he developed abrasions to his left arm and legs. He has been using Xeroform to his wound beds  every other day. He reports improvement to all the wounds except for the one on the back of his right leg. He denies pain or signs of infection. 7/22; patient presents for 1 week follow-up. He has no issues or complaints today. He has tolerated the compression wrap well on his right lower extremity. He has been able to do the dressing changes without issues to his left leg and left arm. His left arm wound has healed. He denies signs of infection. 7/29; patient presents for 1 week follow-up. He reports that his left leg wound is healed. He tolerated the compression wrap well on his right leg. He has no issues or complaints today. He denies signs of infection. 8/4; patient presents for 1 week follow-up. He continues to see improvement in wound healing to his right lower extremity. He has no issues or complaints today. He denies signs of infection. He does state that he went to the dermatologist and had some lesions removed on his face and left  leg. He has been keeping these covered with a Band-Aid. 8/19; patient presents for follow-up. He is tolerated the compression wrap well. He has no issues or complaints today. He now only has 1 remaining wound. 9/1; patient presents for 1 week follow-up. He has tolerated the compression wrap well and has no issues or complaints today. He has been golfing 3 times weekly. He is in good spirits today. 9/15; patient presents for follow-up. He has no issues or complaints today. The wound is closed. Readmission 05/14/2022 Richard [name] is an 81 year old male with a past medical history of uncontrolled type 2 diabetes on oral agents, diffuse large B-cell lymphoma and coronary artery disease status post CABG that presents the clinic for 3 wounds. He developed a right knee wound when he fell out of his golf cart 3 weeks ago. He developed increased warmth and erythema to the right leg. While he was still waiting in the ED on 04/24/2022 for this issue he dropped his phone and created a wound to his left thigh. He has been using Xeroform to these areas. He ultimately required admission from the ED for right lower extremity cellulitis and was treated with Zyvox and Keflex. He also has a right plantar foot wound that started after having callus debridement by his podiatrist. He has had this wound for at least 6 weeks. For the foot wound he has not been keeping it covered and walks around barefoot. He has peripheral neuropathy However states that the wound site is painful. He denies any purulent drainage. He has swelling to this area. Patient History Information obtained from Patient. Allergies benazepril, hydrochlorothiazide, aspirin, lactose, ACE Inhibitors (Reaction: swelling), Entresto (Reaction: swelling) Family History Cancer - Mother, Diabetes - Maternal Grandparents, Heart Disease - Paternal Grandparents, Hypertension - Paternal Grandparents, Seizures - Child, Stroke - Father, No family history of  Hereditary Spherocytosis, Kidney Disease, Lung Disease, Thyroid Problems, Tuberculosis. Social History Never smoker, Marital Status - Divorced, Alcohol Use - Never, Drug Use - No History, Caffeine Use - Rarely. Medical History Eyes Patient has history of Cataracts - Surgery Hematologic/Lymphatic Patient has history of Anemia Respiratory Patient has history of Sleep Apnea Cardiovascular Patient has history of Arrhythmia, Congestive Heart Failure, Coronary Artery Disease, Hypertension Endocrine Patient has history of Type II Diabetes Musculoskeletal Patient has history of Osteoarthritis Richard [name], Richard [name] ([id number]) [id number] Page 7 of 11 Neurologic Patient has history of Neuropathy Oncologic Patient has history of Received Chemotherapy Denies history of Received Radiation Hospitalization/Surgery History -  cellulitis right leg 10/25-10/28/2023. - balloon angioplasty 03/2021. - 12/29/2021 CHF. Medical A Surgical History Notes nd Musculoskeletal Spinal Stenosis Oncologic 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 Review of Systems (ROS) Integumentary (Skin) Complains or has symptoms of Wounds - x3 wounds. Objective Constitutional respirations regular, non-labored and within target range for patient.. Vitals Time Taken: 8:12 AM, Height: 72 in, Source: Stated, Weight: 226 lbs, Source: Stated, BMI: 30.6, Temperature: 97.8 F, Pulse: 73 bpm, Respiratory Rate: 20 breaths/min, Blood Pressure: 118/66 mmHg, Capillary Blood Glucose: 180 mg/dl. Cardiovascular 2+ dorsalis pedis/posterior tibialis pulses. Psychiatric pleasant and cooperative. General Notes: Right lower extremity: T the right knee there is an open wound with granulation tissue and epithelization to the surrounding edges. T the plantar o o foot at the fifth met head there is an open wound that has a depth of almost 1 cm and undermining circumferentially. This is swollen and erythematous.  No purulent drainage. Left thigh with open wound and granulation tissue and epithelization to the edges. Integumentary (Hair, Skin) Wound #5 status is Open. Original cause of wound was Skin T ear/Laceration. The date acquired was: 03/28/2022. The wound is located on the Right Knee. The wound measures 2.8cm length x 1.5cm width x 0.1cm depth; 3.299cm^2 area and 0.33cm^3 volume. There is Fat Layer (Subcutaneous Tissue) exposed. There is no tunneling or undermining noted. There is a medium amount of serosanguineous drainage noted. There is large (67-100%) red granulation within the wound bed. There is a small (1-33%) amount of necrotic tissue within the wound bed including Eschar and Adherent Slough. The periwound skin appearance exhibited: Dry/Scaly. Periwound temperature was noted as No Abnormality. Wound #6 status is Open. Original cause of wound was Skin T ear/Laceration. The date acquired was: 03/27/2022. The wound is located on the Left,Medial Upper Leg. The wound measures 1.5cm length x 1.7cm width x 0.1cm depth; 2.003cm^2 area and 0.2cm^3 volume. There is Fat Layer (Subcutaneous Tissue) exposed. There is no tunneling or undermining noted. There is a medium amount of serosanguineous drainage noted. There is large (67-100%) red granulation within the wound bed. There is a small (1-33%) amount of necrotic tissue within the wound bed including Adherent Slough. The periwound skin appearance had no abnormalities noted for moisture. Periwound temperature was noted as No Abnormality. Wound #7 status is Open. Original cause of wound was Gradually Appeared. The date acquired was: 03/31/2022. The wound is located on the [place]. The wound measures 0.2cm length x 0.2cm width x 0.7cm depth; 0.031cm^2 area and 0.022cm^3 volume. There is Fat Layer (Subcutaneous Tissue) exposed. There is no undermining noted, however, there is tunneling at 6:00 with a maximum distance of 1.2cm. There is a medium amount  of serosanguineous drainage noted. There is large (67-100%) red granulation within the wound bed. There is a small (1-33%) amount of necrotic tissue within the wound bed including Eschar and Adherent Slough. The periwound skin appearance exhibited: Callus, Dry/Scaly. Periwound temperature was noted as No Abnormality. Assessment Active Problems ICD-10 Type 2 diabetes mellitus with foot ulcer Non-pressure chronic ulcer of other part of right foot with other specified severity Non-pressure chronic ulcer of other part of left lower leg with fat layer exposed Non-pressure chronic ulcer of other part of right lower leg with fat layer exposed Patient presents with wounds to his right knee and left thigh caused by trauma. These appear well-healing. He may continue with Xeroform for dressing changes. Patient has a right lateral diabetic foot wound that was started after callus debridement.  He has not seen his podiatrist postdebridement. The area appears infected. I removed devitalized tissue to the surrounding area. I recommended levofloxacin. Also recommended aggressive offloading and silver alginate dressing changes daily. I recommended he not walk around barefoot or keep this open to air. We gave him a surgical shoe with a Pegasus. There is a high concern for osteomyelitis and I recommended obtaining an x-ray (Due to pacemaker he cannot obtain an MRI). We discussed that since he is an uncontrolled diabetic with peripheral neuropathy and an open wound there is high risk for amputation especially based on exam findings today. He expressed understanding. ABI was 1.3 with strong pedal pulses. He should have adequate blood flow for wound healing. Follow-up in 1 week. Richard [name], Richard [name] ([id number]) [id number] Page 8 of 11 Procedures Wound #7 Pre-procedure diagnosis of Wound #7 is a Diabetic Wound/Ulcer of the Lower Extremity located on the Right,Plantar Foot .Severity of Tissue Pre  Debridement is: Fat layer exposed. There was a Selective/Open Wound Skin/Epidermis Debridement with a total area of 4 sq cm performed by Kalman Shan, DO. With the following instrument(s): Forceps, and Scissors to remove Viable and Non-Viable tissue/material. Material removed includes Skin: Epidermis after achieving pain control using Lidocaine 4% T opical Solution. A time out was conducted at 09:15, prior to the start of the procedure. There was no bleeding. The procedure was tolerated well with a pain level of 0 throughout and a pain level of 0 following the procedure. Post Debridement Measurements: 0.2cm length x 0.2cm width x 0.7cm depth; 0.022cm^3 volume. Character of Wound/Ulcer Post Debridement is stable. Severity of Tissue Post Debridement is: Fat layer exposed. Post procedure Diagnosis Wound #7: Same as Pre-Procedure Plan Follow-up Appointments: Return Appointment in 1 week. - Dr. He[place] Other: - x-ray to have performed at any outpatient of any [place]. Pick up oral antibiotics today from your pharmacy. Anesthetic: (In clinic) Topical Lidocaine 4% applied to wound bed Bathing/ Shower/ Hygiene: May shower with protection but do not get wound dressing(s) wet. Edema Control - Lymphedema / SCD / Other: Elevate legs to the level of the heart or above for 30 minutes daily and/or when sitting, a frequency of: - 3-4 times a day throughout the day. Avoid standing for long periods of time. Off-Loading: Open toe surgical shoe to: - peg assist in shoe. while walking and standing wear the shoe. limit standing and walking. No bare feet or just socks always wear shoes when walking. Home Health: New wound care orders this week; continue Home Health for wound care. May utilize formulary equivalent dressing for wound treatment orders unless otherwise specified. - twice a week home health- left upper leg and right knee xeroform and foam border. Other Home Health Orders/Instructions:  - [facility]. Radiology ordered were: X-ray, foot right - x-ray of right foot related to non-healing diabetic ulcer looking for abscess, infection, or osteomyelitis. CPT code The following medication(s) was prescribed: lidocaine topical 4 % cream cream topical applied only in clinic for debridements. was prescribed at facility levofloxacin oral 750 mg tablet 1 1 tablet oral daily x 7 days starting 05/14/2022 WOUND #5: - Knee Wound Laterality: Right Cleanser: Soap and Water Every Other Day/30 Days Discharge Instructions: May shower and wash wound with dial antibacterial soap and water prior to dressing change. Peri-Wound Care: Skin Prep Every Other Day/30 Days Discharge Instructions: Use skin prep as directed Prim Dressing: Xeroform Occlusive Gauze Dressing, 4x4 in Every Other Day/30 Days ary Discharge Instructions: Apply to wound  bed as instructed Secondary Dressing: Zetuvit Plus Silicone Border Dressing 4x4 (in/in) Every Other Day/30 Days Discharge Instructions: Apply silicone border over primary dressing as directed. WOUND #6: - Upper Leg Wound Laterality: Left, Medial Cleanser: Soap and Water Every Other Day/30 Days Discharge Instructions: May shower and wash wound with dial antibacterial soap and water prior to dressing change. Peri-Wound Care: Skin Prep Every Other Day/30 Days Discharge Instructions: Use skin prep as directed Prim Dressing: Xeroform Occlusive Gauze Dressing, 4x4 in Every Other Day/30 Days ary Discharge Instructions: Apply to wound bed as instructed Secondary Dressing: Zetuvit Plus Silicone Border Dressing 4x4 (in/in) Every Other Day/30 Days Discharge Instructions: Apply silicone border over primary dressing as directed. WOUND #7: - Foot Wound Laterality: Plantar, Right Cleanser: Wound Cleanser Every Other Day/30 Days Discharge Instructions: Cleanse the wound with wound cleanser prior to applying a clean dressing using gauze sponges, not tissue or cotton  balls. Prim Dressing: KerraCel Ag Gelling Fiber Dressing, 2x2 in (silver alginate) Every Other Day/30 Days ary Discharge Instructions: Apply silver alginate -DO NOT PACK! Secondary Dressing: Optifoam Non-Adhesive Dressing, 4x4 in Every Other Day/30 Days Discharge Instructions: Apply a foam donut over the alignate Ag. Secondary Dressing: Woven Gauze Sponges 2x2 in Every Other Day/30 Days Discharge Instructions: Apply over primary dressing as directed. Secured With: 42M Medipore H Soft Cloth Surgical T ape, 4 x 10 (in/yd) Every Other Day/30 Days Discharge Instructions: Secure with tape as directed. 1. In office sharp debridement 2. Silver alginate 3. Xeroform 4. Levofloxacin 5. Right foot x-ray 6. Follow-up in 1 week 7. Aggressive offloadingoosurgical shoe with Pegasys Electronic Signature(s) Signed: 05/14/2022 2:06:49 PM By: Amedeo Kinsman ([id number]) [id number] Page 9 of 11 Entered By: Kalman Shan on 05/14/2022 10:32:39 -------------------------------------------------------------------------------- HxROS Details Patient Name: Date of Service: Richard [name] Richard L. 05/14/2022 8:00 A M Medical Record Number: [id number] Patient Account Number: [id number] Date of Birth/Sex: Treating RN: August 31, 1940 (81 y.o. Richard [name] Diener Primary Care Provider: Roma Schanz Other Clinician: Referring Provider: Treating Provider/Extender: Tharon Aquas in Treatment: 0 Information Obtained From Patient Integumentary (Skin) Complaints and Symptoms: Positive for: Wounds - x3 wounds Eyes Medical History: Positive for: Cataracts - Surgery Hematologic/Lymphatic Medical History: Positive for: Anemia Respiratory Medical History: Positive for: Sleep Apnea Cardiovascular Medical History: Positive for: Arrhythmia; Congestive Heart Failure; Coronary Artery Disease; Hypertension Endocrine Medical  History: Positive for: Type II Diabetes Time with diabetes: 20 years Treated with: Oral agents Blood sugar tested every day: Yes Tested : Q am Musculoskeletal Medical History: Positive for: Osteoarthritis Past Medical History Notes: Spinal Stenosis Neurologic Medical History: Positive for: Neuropathy Oncologic Medical History: Positive for: Received Chemotherapy Negative for: Received Radiation Past Medical History Notes: 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 HBO Extended History Items TORIE, PRIEBE ([id number]) [id number] Page 10 of 11 Eyes: Cataracts Immunizations Pneumococcal Vaccine: Received Pneumococcal Vaccination: Yes Received Pneumococcal Vaccination On or After 60th Birthday: No Implantable Devices Yes Hospitalization / Surgery History Type of Hospitalization/Surgery cellulitis right leg 10/25-10/28/2023 balloon angioplasty 03/2021 12/29/2021 CHF Family and Social History Cancer: Yes - Mother; Diabetes: Yes - Maternal Grandparents; Heart Disease: Yes - Paternal Grandparents; Hereditary Spherocytosis: No; Hypertension: Yes - Paternal Grandparents; Kidney Disease: No; Lung Disease: No; Seizures: Yes - Child; Stroke: Yes - Father; Thyroid Problems: No; Tuberculosis: No; Never smoker; Marital Status - Divorced; Alcohol Use: Never; Drug Use: No History; Caffeine Use: Rarely; Financial Concerns: No; Food, Clothing or Shelter Needs: No; Support System Lacking: No; Transportation Concerns:  No Electronic Signature(s) Signed: 05/14/2022 2:06:49 PM By: Kalman Shan DO Signed: 05/14/2022 5:53:09 PM By: Deon Pilling RN, BSN Entered By: Deon Pilling on 05/14/2022 08:15:19 -------------------------------------------------------------------------------- SuperBill Details Patient Name: Date of Service: Richard [name], Richard [name] Richard L. 05/14/2022 Medical Record Number: [id number] Patient Account Number: [id number] Date of Birth/Sex: Treating  RN: 06/01/1941 (81 y.o. [name], Tammi Klippel Primary Care Provider: Roma Schanz Other Clinician: Referring Provider: Treating Provider/Extender: Tharon Aquas in Treatment: 0 Diagnosis Coding ICD-10 Codes Code Description [phone] Type 2 diabetes mellitus with foot ulcer L97.822 Non-pressure chronic ulcer of other part of left lower leg with fat layer exposed L97.812 Non-pressure chronic ulcer of other part of right lower leg with fat layer exposed L97.518 Non-pressure chronic ulcer of other part of right foot with other specified severity Facility Procedures : CPT4 Code: 63149702 Description: 99213 - WOUND CARE VISIT-LEV 3 EST PT Modifier: Quantity: 1 : CPT4 Code: [id number] Description: 02774 - DEBRIDE WOUND 1ST 20 SQ CM OR < ICD-10 Diagnosis Description L97.518 Non-pressure chronic ulcer of other part of right foot with other specified sever Modifier: ity Quantity: 1 Physician Procedures : CPT4 Code Description Modifier [id number] - WC PHYS LEVEL 4 - EST PT ICD-10 Diagnosis Description E11.621 Type 2 diabetes mellitus with foot ulcer L97.822 Non-pressure chronic ulcer of other part of left lower leg with fat layer exposed L97.812  Non-pressure chronic ulcer of other part of right lower leg with fat layer exposed L97.518 Non-pressure chronic ulcer of other part of right foot with other specified severity TYRIQUE, SPORN ([id number]) [id number] Quantity: 1 df Page 11 of 11 : [id number] - WC PHYS DEBR WO ANESTH 20 SQ CM 1 ICD-10 Diagnosis Description L97.518 Non-pressure chronic ulcer of other part of right foot with other specified severity Quantity: Electronic Signature(s) Signed: 05/14/2022 2:06:49 PM By: Kalman Shan DO Entered By: Kalman Shan on 05/14/2022 10:33:17

## 2022-05-16 DIAGNOSIS — S81002D Unspecified open wound, left knee, subsequent encounter: Secondary | ICD-10-CM | POA: Diagnosis not present

## 2022-05-16 DIAGNOSIS — L988 Other specified disorders of the skin and subcutaneous tissue: Secondary | ICD-10-CM | POA: Diagnosis not present

## 2022-05-16 DIAGNOSIS — I130 Hypertensive heart and chronic kidney disease with heart failure and stage 1 through stage 4 chronic kidney disease, or unspecified chronic kidney disease: Secondary | ICD-10-CM | POA: Diagnosis not present

## 2022-05-16 DIAGNOSIS — L03115 Cellulitis of right lower limb: Secondary | ICD-10-CM | POA: Diagnosis not present

## 2022-05-16 DIAGNOSIS — S81001D Unspecified open wound, right knee, subsequent encounter: Secondary | ICD-10-CM | POA: Diagnosis not present

## 2022-05-16 DIAGNOSIS — E1142 Type 2 diabetes mellitus with diabetic polyneuropathy: Secondary | ICD-10-CM | POA: Diagnosis not present

## 2022-05-17 DIAGNOSIS — Z45018 Encounter for adjustment and management of other part of cardiac pacemaker: Secondary | ICD-10-CM | POA: Diagnosis not present

## 2022-05-17 DIAGNOSIS — I442 Atrioventricular block, complete: Secondary | ICD-10-CM | POA: Diagnosis not present

## 2022-05-18 DIAGNOSIS — S81001D Unspecified open wound, right knee, subsequent encounter: Secondary | ICD-10-CM | POA: Diagnosis not present

## 2022-05-18 DIAGNOSIS — S81002D Unspecified open wound, left knee, subsequent encounter: Secondary | ICD-10-CM | POA: Diagnosis not present

## 2022-05-18 DIAGNOSIS — L03115 Cellulitis of right lower limb: Secondary | ICD-10-CM | POA: Diagnosis not present

## 2022-05-18 DIAGNOSIS — L988 Other specified disorders of the skin and subcutaneous tissue: Secondary | ICD-10-CM | POA: Diagnosis not present

## 2022-05-18 DIAGNOSIS — E1142 Type 2 diabetes mellitus with diabetic polyneuropathy: Secondary | ICD-10-CM | POA: Diagnosis not present

## 2022-05-18 DIAGNOSIS — I130 Hypertensive heart and chronic kidney disease with heart failure and stage 1 through stage 4 chronic kidney disease, or unspecified chronic kidney disease: Secondary | ICD-10-CM | POA: Diagnosis not present

## 2022-05-20 ENCOUNTER — Ambulatory Visit: Payer: Medicare Other | Attending: Physician Assistant | Admitting: Physician Assistant

## 2022-05-20 ENCOUNTER — Encounter: Payer: Self-pay | Admitting: Physician Assistant

## 2022-05-20 VITALS — BP 124/66 | HR 72 | Ht 72.0 in | Wt 234.6 lb

## 2022-05-20 DIAGNOSIS — I442 Atrioventricular block, complete: Secondary | ICD-10-CM | POA: Diagnosis not present

## 2022-05-20 DIAGNOSIS — I10 Essential (primary) hypertension: Secondary | ICD-10-CM | POA: Diagnosis not present

## 2022-05-20 DIAGNOSIS — I25810 Atherosclerosis of coronary artery bypass graft(s) without angina pectoris: Secondary | ICD-10-CM | POA: Insufficient documentation

## 2022-05-20 DIAGNOSIS — I350 Nonrheumatic aortic (valve) stenosis: Secondary | ICD-10-CM | POA: Diagnosis not present

## 2022-05-20 DIAGNOSIS — I130 Hypertensive heart and chronic kidney disease with heart failure and stage 1 through stage 4 chronic kidney disease, or unspecified chronic kidney disease: Secondary | ICD-10-CM | POA: Diagnosis not present

## 2022-05-20 DIAGNOSIS — Z952 Presence of prosthetic heart valve: Secondary | ICD-10-CM | POA: Diagnosis not present

## 2022-05-20 DIAGNOSIS — Z7901 Long term (current) use of anticoagulants: Secondary | ICD-10-CM | POA: Diagnosis not present

## 2022-05-20 DIAGNOSIS — I4821 Permanent atrial fibrillation: Secondary | ICD-10-CM | POA: Diagnosis not present

## 2022-05-20 DIAGNOSIS — G4733 Obstructive sleep apnea (adult) (pediatric): Secondary | ICD-10-CM | POA: Insufficient documentation

## 2022-05-20 DIAGNOSIS — L03115 Cellulitis of right lower limb: Secondary | ICD-10-CM | POA: Diagnosis not present

## 2022-05-20 DIAGNOSIS — S81001D Unspecified open wound, right knee, subsequent encounter: Secondary | ICD-10-CM | POA: Diagnosis not present

## 2022-05-20 DIAGNOSIS — I871 Compression of vein: Secondary | ICD-10-CM | POA: Diagnosis not present

## 2022-05-20 DIAGNOSIS — E1142 Type 2 diabetes mellitus with diabetic polyneuropathy: Secondary | ICD-10-CM | POA: Diagnosis not present

## 2022-05-20 DIAGNOSIS — E785 Hyperlipidemia, unspecified: Secondary | ICD-10-CM | POA: Diagnosis not present

## 2022-05-20 DIAGNOSIS — D61818 Other pancytopenia: Secondary | ICD-10-CM | POA: Insufficient documentation

## 2022-05-20 DIAGNOSIS — I5042 Chronic combined systolic (congestive) and diastolic (congestive) heart failure: Secondary | ICD-10-CM | POA: Insufficient documentation

## 2022-05-20 DIAGNOSIS — L988 Other specified disorders of the skin and subcutaneous tissue: Secondary | ICD-10-CM | POA: Diagnosis not present

## 2022-05-20 DIAGNOSIS — S81002D Unspecified open wound, left knee, subsequent encounter: Secondary | ICD-10-CM | POA: Diagnosis not present

## 2022-05-20 NOTE — Patient Instructions (Signed)
Medication Instructions:  Your physician recommends that you continue on your current medications as directed. Please refer to the Current Medication list given to you today.  *If you need a refill on your cardiac medications before your next appointment, please call your pharmacy*   Lab Work: NONE If you have labs (blood work) drawn today and your tests are completely normal, you will receive your results only by: [facility] (if you have MyChart) OR A paper copy in the mail If you have any lab test that is abnormal or we need to change your treatment, we will call you to review the results.   Testing/Procedures: NONE   Follow-Up: At [facility], you and your health needs are our priority.  As part of our continuing mission to provide you with exceptional heart care, we have created designated Provider Care Teams.  These Care Teams include your primary Cardiologist (physician) and Advanced Practice Providers (APPs -  Physician Assistants and Nurse Practitioners) who all work together to provide you with the care you need, when you need it.  We recommend signing up for the patient portal called "MyChart".  Sign up information is provided on this After Visit Summary.  MyChart is used to connect with patients for Virtual Visits (Telemedicine).  Patients are able to view lab/test results, encounter notes, upcoming appointments, etc.  Non-urgent messages can be sent to your provider as well.   To learn more about what you can do with MyChart, go to [email or web address].    Your next appointment:   5-6 month(s)  The format for your next appointment:   In Person  Provider:   Minus Breeding, MD

## 2022-05-20 NOTE — Progress Notes (Deleted)
$'@Patient'D$  ID: Richard [name], male    DOB: 1940-12-27, 81 y.o.   MRN: [id number]  No chief complaint on file.   Referring provider: [name], *  HPI: 81 year old male.  Past medical history significant for obstructive sleep apnea, congestive heart failure, diffuse large B-cell lymphoma, severe aortic stenosis status post TAVR and PPM, A-fib/flutter on chronic anticoagulation. Patient of Dr. Elsworth Soho.     05/21/2022- Interim hx  Patient presents today for regular follow-up. He was last seen in July.   Maintained on CPAP  He is on Torsemide and zaroxlyn for heart failure   Follow-up on ONO  ? Any hemoptysis   Significant tests/ events reviewed   2008 PSG RDI 48/h ONO on CPAP /RA 08/2014 >> no desatn - dc O2  Allergies  Allergen Reactions   Ace Inhibitors Swelling and Other (See Comments)    Angioedema    Benazepril Swelling and Other (See Comments)    Angioedema; he is not a candidate for any angiotensin receptor blockers because of this significant allergic reaction. Because of a history of documented adverse serious drug reaction;Medi Alert bracelet  is recommended   Entresto [Sacubitril-Valsartan] Swelling and Other (See Comments)    First-in-Class Angiotensin Receptor Neprilysin Inhibitor- Med was "red-flagged" by the patient's pharmacy for him to NOT take!   Hctz [Hydrochlorothiazide] Anaphylaxis and Swelling    Tongue and lip swelling    Aspirin Other (See Comments)    Gastritis, can aspirin not take 325 mg aspirin    Immunization History  Administered Date(s) Administered   Fluad Quad(high Dose 65+) 03/21/2021   Influenza Split 06/12/2011, 05/31/2013   Influenza Whole 05/01/2012   Influenza, High Dose Seasonal PF 02/09/2015, 03/12/2017, 03/09/2018, 03/02/2020   Influenza-Unspecified 02/11/2014, 02/29/2016, 02/25/2019   Moderna Sars-Covid-2 Vaccination 08/30/2019, 09/30/2019, 04/21/2020   Pneumococcal Conjugate-13 09/18/2015   Pneumococcal  Polysaccharide-23 06/20/2013   Td 03/20/2010, 03/16/2013, 09/29/2017   Zoster Recombinat (Shingrix) 11/21/2016, 09/25/2017    Past Medical History:  Diagnosis Date   Anemia    Arthritis    hips   Axillary adenopathy 02/25/2017   Bradycardia    a. holter monitor has demonstrated HRs in 30s and [place]    CAD (coronary artery disease)    a. s/p CABG 2001  b.  07/28/2017 cath:   Severe three-vessel native CAD with total occlusion of LAD, ramus intermedius, first OM and RCA, patent RIMA to PDA, LIMA to LAD, sequential SVG to ramus intermedius and first OM.     Chronic lower back pain    Diffuse large B cell lymphoma (HCC)    Diverticulosis    Esophageal stricture    GERD (gastroesophageal reflux disease)    History of gout    HTN (hypertension)    Mixed hyperlipidemia    OSA on CPAP    with 2L O2 at night   Pancytopenia ([place])    a. related to chemo therapy for B cell lymphoma   Peptic stricture of esophagus    Presence of permanent cardiac pacemaker    sees Dr. Valaria Good pacemaker   SCC (squamous cell carcinoma) 01/31/2021   R zygoma, ED[facility]   SCC (squamous cell carcinoma) 01/31/2021   L post ankle, EDC   SCC (squamous cell carcinoma) 01/31/2021   L popliteal, EDC   Severe aortic stenosis    Spinal stenosis    Squamous cell carcinoma of skin 03/22/2009   Right mandible. SCCis, hypertrophic.    Squamous cell carcinoma of skin 12/25/2021  R lateral cheek, ED[facility]   Squamous cell carcinoma of skin 12/25/2021   R postauricular neck, EDC   Squamous cell carcinoma of skin 12/25/2021   R forearm sup, EDC   Squamous cell carcinoma of skin 12/25/2021   R wrist, EDC   Type II diabetes mellitus ([place])    Wears dentures    partial upper    Tobacco History: Social History   Tobacco Use  Smoking Status Never  Smokeless Tobacco Never   Counseling given: Not Answered   Outpatient Medications Prior to Visit  Medication Sig Dispense Refill   acetaminophen (TYLENOL) 325 MG  tablet Take 650 mg by mouth at bedtime as needed for moderate pain or headache.     allopurinol (ZYLOPRIM) 300 MG tablet Take 450 mg by mouth daily.     aluminum hydroxide-magnesium carbonate (GAVISCON) 95-358 MG/15ML SUSP Take 15 mLs by mouth as needed for indigestion or heartburn.     amLODipine (NORVASC) 5 MG tablet Take 5 mg by mouth daily.     apixaban (ELIQUIS) 2.5 MG TABS tablet Take 1 tablet (2.5 mg total) by mouth 2 (two) times daily. 180 tablet 2   Ascorbic Acid (VITAMIN C PO) Take 1 tablet by mouth daily with breakfast.     atorvastatin (LIPITOR) 80 MG tablet TAKE 1 TABLET BY MOUTH EVERYDAY AT BEDTIME (Patient taking differently: Take 80 mg by mouth at bedtime.) 90 tablet 3   azelastine (ASTELIN) 0.1 % nasal spray PLACE 2 SPRAYS INTO BOTH NOSTRILS AT BEDTIME AS NEEDED FOR RHINITIS OR ALLERGIES. 30 mL 4   Coenzyme Q10 (CO Q-10 PO) Take 1 capsule by mouth daily.     esomeprazole (NEXIUM) 40 MG capsule Take 1 capsule (40 mg total) by mouth daily. (Patient taking differently: Take 40 mg by mouth daily before breakfast.) 30 capsule 5   ezetimibe (ZETIA) 10 MG tablet TAKE 1 TABLET BY MOUTH EVERY DAY (Patient taking differently: Take 10 mg by mouth daily.) 90 tablet 3   FARXIGA 10 MG TABS tablet Take 10 mg by mouth in the morning.     fenofibrate 160 MG tablet TAKE 1 TABLET BY MOUTH EVERY DAY 90 tablet 0   folic acid (FOLVITE) 1 MG tablet TAKE 2 TABLETS BY MOUTH EVERY DAY (Patient taking differently: Take 2 mg by mouth in the morning.) 180 tablet 4   FREESTYLE LITE test strip USE TO TEST BLOOD SUGAR ONCE A DAY. DX CODE: E11.9 100 strip 12   glimepiride (AMARYL) 2 MG tablet 1 po bid 60 tablet 3   icosapent Ethyl (VASCEPA) 1 g capsule TAKE 2 CAPSULES BY MOUTH TWICE A DAY (Patient taking differently: Take 2 g by mouth in the morning and at bedtime.) 360 capsule 1   Lancets (FREESTYLE) lancets USE ONCE A DAY TO CHECK BLOOD SUGAR. 100 each 12   Menthol-Camphor (ICY HOT ADVANCED PAIN RELIEF EX) Apply  1 application  topically daily as needed (to painful sites).     metoprolol succinate (TOPROL-XL) 25 MG 24 hr tablet Take 1 tablet (25 mg total) by mouth daily. 90 tablet 3   nitroGLYCERIN (NITROSTAT) 0.4 MG SL tablet PLACE 1 TABLET (0.4 MG TOTAL) UNDER THE TONGUE EVERY 5 (FIVE) MINUTES AS NEEDED FOR CHEST PAIN. 25 tablet 6   oxyCODONE (OXY IR/ROXICODONE) 5 MG immediate release tablet Take 1 tablet (5 mg total) by mouth every 6 (six) hours as needed for moderate pain. 20 tablet 0   potassium chloride SA (KLOR-CON M20) 20 MEQ tablet Take 2 tablets (  40 mEq total) by mouth daily. 60 tablet 0   pregabalin (LYRICA) 150 MG capsule TAKE 1 CAPSULE BY MOUTH TWICE A DAY 180 capsule 1   psyllium (METAMUCIL) 58.6 % powder Take 1 packet by mouth daily as needed (for constipation- mix and drink).     senna (SENOKOT) 8.6 MG TABS tablet Take 2 tablets (17.2 mg total) by mouth at bedtime. This is for constipation.  Stop taking if you start having diarrhea. (Patient taking differently: Take 2 tablets by mouth daily as needed for mild constipation. This is for constipation.  Stop taking if you start having diarrhea.) 20 tablet 0   torsemide (DEMADEX) 20 MG tablet Take 2 tablets (40 mg total) by mouth in the morning. Do not take on 01/24/2022. 180 tablet 3   No facility-administered medications prior to visit.      Review of Systems  Review of Systems   Physical Exam  There were no vitals taken for this visit. Physical Exam   Lab Results:  CBC    Component Value Date/Time   WBC 9.2 04/30/2022 1153   RBC 4.28 04/30/2022 1153   HGB 12.4 (L) 04/30/2022 1153   HGB 14.1 04/17/2022 1300   HGB 11.5 (L) 09/18/2017 1559   HGB 10.8 (L) 06/27/2017 0857   HCT 38.1 (L) 04/30/2022 1153   HCT 36.7 (L) 09/18/2017 1559   HCT 34.0 (L) 06/27/2017 0857   PLT 224.0 04/30/2022 1153   PLT 192 04/17/2022 1300   PLT 69 (LL) 09/18/2017 1559   MCV 89.0 04/30/2022 1153   MCV 90 09/18/2017 1559   MCV 90 06/27/2017 0857    MCH 29.1 04/27/2022 0545   MCHC 32.6 04/30/2022 1153   RDW 18.2 (H) 04/30/2022 1153   RDW 18.4 (H) 09/18/2017 1559   RDW 17.9 (H) 06/27/2017 0857   LYMPHSABS 1.3 04/30/2022 1153   LYMPHSABS 0.5 (L) 06/27/2017 0857   MONOABS 0.6 04/30/2022 1153   EOSABS 0.2 04/30/2022 1153   EOSABS 0.0 06/27/2017 0857   BASOSABS 0.0 04/30/2022 1153   BASOSABS 0.1 06/27/2017 0857    BMET    Component Value Date/Time   NA 139 04/30/2022 1153   NA 138 02/08/2022 0811   NA 144 06/27/2017 0857   K 4.5 04/30/2022 1153   K 3.9 06/27/2017 0857   CL 101 04/30/2022 1153   CL 103 06/27/2017 0857   CO2 28 04/30/2022 1153   CO2 26 06/27/2017 0857   GLUCOSE 190 (H) 04/30/2022 1153   GLUCOSE 166 (H) 06/27/2017 0857   BUN 31 (H) 04/30/2022 1153   BUN 46 (H) 02/08/2022 0811   BUN 12 06/27/2017 0857   CREATININE 1.67 (H) 04/30/2022 1153   CREATININE 1.80 (H) 04/17/2022 1300   CREATININE 1.65 (H) 04/18/2020 0956   CALCIUM 8.3 (L) 04/30/2022 1153   CALCIUM 9.2 06/27/2017 0857   GFRNONAA 50 (L) 04/27/2022 0545   GFRNONAA 37 (L) 04/17/2022 1300   GFRAA 42 (L) 03/16/2020 0814    BNP    Component Value Date/Time   BNP 969.2 (H) 01/17/2022 0514    ProBNP    Component Value Date/Time   PROBNP 1,211.0 (H) 01/15/2022 1122    Imaging: DG Foot Complete Right  Result Date: 05/15/2022 CLINICAL DATA:  foot ulcer EXAM: RIGHT FOOT COMPLETE - 3+ VIEW COMPARISON:  None Available. FINDINGS: Early DJD at the first MTP joint. Small corticated ossicles the lateral margin of the fifth MTP joint. Regional soft tissue swelling. No focal bone erosion. No fracture or  dislocation. Small calcaneal spur. No subcutaneous gas or foreign body. IMPRESSION: Soft tissue swelling without fracture or other acute finding. Electronically Signed   By: Richard Europe M.D.   On: 05/15/2022 19:46   VAS [place] LOWER EXTREMITY VENOUS (DVT)  Result Date: 04/27/2022  Lower Venous DVT Study Patient Name:  Richard [name]  Date of Exam:   04/26/2022  Medical Rec #: [id number]       Accession #:    [id number] Date of Birth: 1941-03-05       Patient Gender: M Patient Age:   37 years Exam Location:  [facility] Procedure:      VAS [place] LOWER EXTREMITY VENOUS (DVT) Referring Phys: ERIC [place] --------------------------------------------------------------------------------  Indications: Warmth, erythema, swelling of right leg. Cellulitis.  Anticoagulation: Eliquis. Comparison Study: 01-16-2022 Prior right lower extremity venous study was                   negative for DVT. Performing Technologist: Darlin Coco RDMS, RVT  Examination Guidelines: A complete evaluation includes B-mode imaging, spectral Doppler, color Doppler, and power Doppler as needed of all accessible portions of each vessel. Bilateral testing is considered an integral part of a complete examination. Limited examinations for reoccurring indications may be performed as noted. The reflux portion of the exam is performed with the patient in reverse Trendelenburg.  +---------+---------------+---------+-----------+----------+--------------+ RIGHT    CompressibilityPhasicitySpontaneityPropertiesThrombus Aging +---------+---------------+---------+-----------+----------+--------------+ CFV      Full           Yes      Yes                                 +---------+---------------+---------+-----------+----------+--------------+ SFJ      Full                                                        +---------+---------------+---------+-----------+----------+--------------+ FV Prox  Full                                                        +---------+---------------+---------+-----------+----------+--------------+ FV Mid   Full                                                        +---------+---------------+---------+-----------+----------+--------------+ FV DistalFull                                                         +---------+---------------+---------+-----------+----------+--------------+ PFV      Full                                                        +---------+---------------+---------+-----------+----------+--------------+  POP      Full           Yes      Yes                                 +---------+---------------+---------+-----------+----------+--------------+ PTV      Full                                                        +---------+---------------+---------+-----------+----------+--------------+ PERO     Full                                                        +---------+---------------+---------+-----------+----------+--------------+ Gastroc  Full                                                        +---------+---------------+---------+-----------+----------+--------------+   +----+---------------+---------+-----------+----------+--------------+ LEFTCompressibilityPhasicitySpontaneityPropertiesThrombus Aging +----+---------------+---------+-----------+----------+--------------+ CFV Full           Yes      Yes                                 +----+---------------+---------+-----------+----------+--------------+    Summary: RIGHT: - There is no evidence of deep vein thrombosis in the lower extremity.  - No cystic structure found in the popliteal fossa.  LEFT: - No evidence of common femoral vein obstruction.  *See table(s) above for measurements and observations. Electronically signed by Jamelle Haring on 04/27/2022 at 10:39:28 AM.    Final    DG Tibia/Fibula Right  Result Date: 04/24/2022 CLINICAL DATA:  Fall, pain EXAM: RIGHT TIBIA AND FIBULA - 2 VIEW COMPARISON:  None Available. FINDINGS: There is no evidence of acute fracture. Alignment is normal. Chronic post traumatic deformity of the tibial diaphysis. Adjacent heterotopic ossifiction at the interosseous membrane/medial fibular cortex. Mild track a osteoarthritis of the knee. There is soft  tissue swelling of the lower extremity most prominent anteriorly along the tibia and prepatellar soft tissues. There is prepatellar skin irregularity. There are postsurgical changes overlying the medial soft tissues and vascular calcifications. IMPRESSION: Soft tissue swelling of the right lower extremity most prominent anteriorly along the tibia and the prepatellar soft tissues and with adjacent prepatellar skin irregularity. Chronic posttraumatic deformity of the tibial diaphysis with adjacent heterotopic ossification. No acute osseous abnormality. Electronically Signed   By: Maurine Simmering M.D.   On: 04/24/2022 12:02     Assessment & Plan:   No problem-specific Assessment & Plan notes found for this encounter.     Martyn Ehrich, NP 05/20/2022

## 2022-05-21 ENCOUNTER — Other Ambulatory Visit: Payer: Self-pay | Admitting: Family Medicine

## 2022-05-21 ENCOUNTER — Ambulatory Visit: Payer: Medicare Other | Admitting: Primary Care

## 2022-05-21 ENCOUNTER — Encounter (HOSPITAL_BASED_OUTPATIENT_CLINIC_OR_DEPARTMENT_OTHER): Payer: Medicare Other | Admitting: Internal Medicine

## 2022-05-21 ENCOUNTER — Other Ambulatory Visit (HOSPITAL_COMMUNITY): Payer: Self-pay | Admitting: Internal Medicine

## 2022-05-21 DIAGNOSIS — L97812 Non-pressure chronic ulcer of other part of right lower leg with fat layer exposed: Secondary | ICD-10-CM | POA: Diagnosis not present

## 2022-05-21 DIAGNOSIS — L97822 Non-pressure chronic ulcer of other part of left lower leg with fat layer exposed: Secondary | ICD-10-CM | POA: Diagnosis not present

## 2022-05-21 DIAGNOSIS — L97518 Non-pressure chronic ulcer of other part of right foot with other specified severity: Secondary | ICD-10-CM

## 2022-05-21 DIAGNOSIS — E11621 Type 2 diabetes mellitus with foot ulcer: Secondary | ICD-10-CM | POA: Diagnosis not present

## 2022-05-21 DIAGNOSIS — I509 Heart failure, unspecified: Secondary | ICD-10-CM | POA: Diagnosis not present

## 2022-05-21 DIAGNOSIS — I110 Hypertensive heart disease with heart failure: Secondary | ICD-10-CM | POA: Diagnosis not present

## 2022-05-21 DIAGNOSIS — E1122 Type 2 diabetes mellitus with diabetic chronic kidney disease: Secondary | ICD-10-CM

## 2022-05-21 NOTE — Progress Notes (Signed)
SYDNEY, AZURE ([id number]) [id number] Page 1 of 8 Visit Report for 05/21/2022 Chief Complaint Document Details Patient Name: Date of Service: Richard [name] [name] 05/21/2022 1:00 PM Medical Record Number: [id number] Patient Account Number: [id number] Date of Birth/Sex: Treating RN: 1940/10/09 (81 y.o. M) Primary Care Provider: Roma Schanz Other Clinician: Referring Provider: Treating Provider/Extender: Tharon Aquas in Treatment: 1 Information Obtained from: Patient Chief Complaint 01/12/2021; Wounds to his left upper extremity and bilateral lower extremities. 05/14/2022; bilateral lower extremity wounds, right plantar foot wound Electronic Signature(s) Signed: 05/21/2022 3:46:12 PM By: Kalman Shan DO Entered By: Kalman Shan on 05/21/2022 13:37:45 -------------------------------------------------------------------------------- HPI Details Patient Name: Date of Service: Richard [name], Richard [name] [name] 05/21/2022 1:00 PM Medical Record Number: [id number] Patient Account Number: [id number] Date of Birth/Sex: Treating RN: [birth year] (81 y.o. M) Primary Care Provider: Roma Schanz Other Clinician: Referring Provider: Treating Provider/Extender: Tharon Aquas in Treatment: 1 History of Present Illness HPI Description: Admission 7/15 [name] is an 81 year old male with a past medical history of type 2 diabetes on oral agents, diffuse large B-cell lymphoma, and coronary artery disease status post CABG that presents to the clinic for wounds to his left upper extremity and bilateral lower extremities. He states that he went to urgent care to be evaluated for a fever. And he was instructed to go to the ED to be further evaluated for possible sepsis. Unfortunately he passed out in his car After his appointment and was not discovered for another 3 hours. He was eventually discovered and EMS  had to take him out of his vehicle. This is when he developed abrasions to his left arm and legs. He has been using Xeroform to his wound beds every other day. He reports improvement to all the wounds except for the one on the back of his right leg. He denies pain or signs of infection. 7/22; patient presents for 1 week follow-up. He has no issues or complaints today. He has tolerated the compression wrap well on his right lower extremity. He has been able to do the dressing changes without issues to his left leg and left arm. His left arm wound has healed. He denies signs of infection. 7/29; patient presents for 1 week follow-up. He reports that his left leg wound is healed. He tolerated the compression wrap well on his right leg. He has no issues or complaints today. He denies signs of infection. 8/4; patient presents for 1 week follow-up. He continues to see improvement in wound healing to his right lower extremity. He has no issues or complaints today. He denies signs of infection. He does state that he went to the dermatologist and had some lesions removed on his face and left leg. He has been keeping these covered with a Band-Aid. 8/19; patient presents for follow-up. He is tolerated the compression wrap well. He has no issues or complaints today. He now only has 1 remaining wound. 9/1; patient presents for 1 week follow-up. He has tolerated the compression wrap well and has no issues or complaints today. He has been golfing 3 times weekly. He is in good spirits today. 9/15; patient presents for follow-up. He has no issues or complaints today. The wound is closed. Readmission 05/14/2022 [name] is an 81 year old male with a past medical history of uncontrolled type 2 diabetes on oral agents, diffuse large B-cell lymphoma and coronary artery disease status post CABG that presents the clinic for 3 wounds. He developed a  right knee wound when he fell out of his golf cart 3 weeks ago.  He developed increased warmth and erythema to the right leg. While he was still waiting in the ED on 04/24/2022 for this issue he dropped his phone and created a wound to his left thigh. He has been using Xeroform to these areas. He ultimately required admission from the ED for right lower extremity cellulitis and was HANEEF, HALLQUIST ([id number]) [id number] Page 2 of 8 treated with Zyvox and Keflex. He also has a right plantar foot wound that started after having callus debridement by his podiatrist. He has had this wound for at least 6 weeks. For the foot wound he has not been keeping it covered and walks around barefoot. He has peripheral neuropathy However states that the wound site is painful. He denies any purulent drainage. He has swelling to this area. 11/21; patient presents for follow-up. He had an x-ray done at last clinic visit to the right foot that showed no focal bony erosion suggesting osteomyelitis. He completed his course of levofloxacin. He has been using his front offloading shoe. He has been using Xeroform to the knee wounds. He has no issues or complaints today. Electronic Signature(s) Signed: 05/21/2022 3:46:12 PM By: Kalman Shan DO Entered By: Kalman Shan on 05/21/2022 13:39:11 -------------------------------------------------------------------------------- Physical Exam Details Patient Name: Date of Service: Richard [name], Richard [name] [name] 05/21/2022 1:00 PM Medical Record Number: [id number] Patient Account Number: [id number] Date of Birth/Sex: Treating RN: December 12, 1940 (81 y.o. M) Primary Care Provider: Roma Schanz Other Clinician: Referring Provider: Treating Provider/Extender: Tharon Aquas in Treatment: 1 Constitutional respirations regular, non-labored and within target range for patient.. Cardiovascular 2+ dorsalis pedis/posterior tibialis pulses. Psychiatric pleasant and cooperative. Notes Right  lower extremity: T the right knee there is an open wound with granulation tissue with mostly epithelization to the edges. T the plantar foot at the fifth o o met head there is epithelization to the previous wound site. No drainage noted on palpation. No pain on palpation. No increased warmth or erythema to the area. T the left upper thigh there is epithelization to the previous wound site o Electronic Signature(s) Signed: 05/21/2022 3:46:12 PM By: Kalman Shan DO Entered By: Kalman Shan on 05/21/2022 13:40:22 -------------------------------------------------------------------------------- Physician Orders Details Patient Name: Date of Service: Richard [name], Richard [name] [name] 05/21/2022 1:00 PM Medical Record Number: [id number] Patient Account Number: [id number] Date of Birth/Sex: Treating RN: [birth year] (81 y.o. [name], Tammi Klippel Primary Care Provider: Roma Schanz Other Clinician: Referring Provider: Treating Provider/Extender: Tharon Aquas in Treatment: 1 Verbal / Phone Orders: No Diagnosis Coding ICD-10 Coding Code Description E11.621 Type 2 diabetes mellitus with foot ulcer L97.518 Non-pressure chronic ulcer of other part of right foot with other specified severity L97.822 Non-pressure chronic ulcer of other part of left lower leg with fat layer exposed L97.812 Non-pressure chronic ulcer of other part of right lower leg with fat layer exposed RAFFI, MILSTEIN ([id number]) [id number] Page 3 of 8 Follow-up Appointments ppointment in 1 week. - Dr. Heber [place] Return A Other: - Cancel the CT scan. [place] office of wound care and patient to cancel the CT scan. Anesthetic (In clinic) Topical Lidocaine 4% applied to wound bed Bathing/ Shower/ Hygiene May shower with protection but do not get wound dressing(s) wet. Edema Control - Lymphedema / SCD / Other Elevate legs to the level of the heart or above for 30 minutes daily and/or  when sitting, a frequency of: - 3-4 times a  day throughout the day. Avoid standing for long periods of time. Off-Loading Wedge shoe to: - while walking and standing wear the shoe. limit standing and walking. No bare feet or just socks always wear shoes when walking. wear one more week. Home Health New wound care orders this week; continue Home Health for wound care. May utilize formulary equivalent dressing for wound treatment orders unless otherwise specified. - right knee xeroform and foam border. once this week. Will see patient next week. Other Home Health Orders/Instructions: - [facility]. Wound Treatment Wound #5 - Knee Wound Laterality: Right Cleanser: Soap and Water Every Other Day/30 Days Discharge Instructions: May shower and wash wound with dial antibacterial soap and water prior to dressing change. Peri-Wound Care: Skin Prep Every Other Day/30 Days Discharge Instructions: Use skin prep as directed Prim Dressing: Xeroform Occlusive Gauze Dressing, 4x4 in Every Other Day/30 Days ary Discharge Instructions: Apply to wound bed as instructed Secondary Dressing: Zetuvit Plus Silicone Border Dressing 4x4 (in/in) Every Other Day/30 Days Discharge Instructions: Apply silicone border over primary dressing as directed. Electronic Signature(s) Signed: 05/21/2022 3:46:12 PM By: Kalman Shan DO Entered By: Kalman Shan on 05/21/2022 13:40:30 -------------------------------------------------------------------------------- Problem List Details Patient Name: Date of Service: Richard [name], Richard [name] [name] 05/21/2022 1:00 PM Medical Record Number: [id number] Patient Account Number: [id number] Date of Birth/Sex: Treating RN: 1940/11/11 (81 y.o. [name] Primary Care Provider: Roma Schanz Other Clinician: Referring Provider: Treating Provider/Extender: Tharon Aquas in Treatment: 1 Active Problems ICD-10 Encounter Code Description Active  Date MDM Diagnosis E11.621 Type 2 diabetes mellitus with foot ulcer 05/14/2022 No Yes L97.518 Non-pressure chronic ulcer of other part of right foot with other specified 05/14/2022 No Yes severity L97.822 Non-pressure chronic ulcer of other part of left lower leg with fat layer 05/14/2022 No Yes exposed ARMON, ORVIS ([id number]) [id number] Page 4 of 8 [phone] Non-pressure chronic ulcer of other part of right lower leg with fat layer 05/14/2022 No Yes exposed Inactive Problems Resolved Problems Electronic Signature(s) Signed: 05/21/2022 3:46:12 PM By: Kalman Shan DO Entered By: Kalman Shan on 05/21/2022 13:37:31 -------------------------------------------------------------------------------- Progress Note Details Patient Name: Date of Service: Richard [name], Richard [name] [name] 05/21/2022 1:00 PM Medical Record Number: [id number] Patient Account Number: [id number] Date of Birth/Sex: Treating RN: [birth year] (81 y.o. M) Primary Care Provider: Roma Schanz Other Clinician: Referring Provider: Treating Provider/Extender: Tharon Aquas in Treatment: 1 Subjective Chief Complaint Information obtained from Patient 01/12/2021; Wounds to his left upper extremity and bilateral lower extremities. 05/14/2022; bilateral lower extremity wounds, right plantar foot wound History of Present Illness (HPI) Admission 7/15 Mr. Fortino Stebner is an 81 year old male with a past medical history of type 2 diabetes on oral agents, diffuse large B-cell lymphoma, and coronary artery disease status post CABG that presents to the clinic for wounds to his left upper extremity and bilateral lower extremities. He states that he went to urgent care to be evaluated for a fever. And he was instructed to go to the ED to be further evaluated for possible sepsis. Unfortunately he passed out in his car After his appointment and was not discovered for another 3 hours.  He was eventually discovered and EMS had to take him out of his vehicle. This is when he developed abrasions to his left arm and legs. He has been using Xeroform to his wound beds every other day. He reports improvement to all the wounds except for the one on the back of his right leg. He  denies pain or signs of infection. 7/22; patient presents for 1 week follow-up. He has no issues or complaints today. He has tolerated the compression wrap well on his right lower extremity. He has been able to do the dressing changes without issues to his left leg and left arm. His left arm wound has healed. He denies signs of infection. 7/29; patient presents for 1 week follow-up. He reports that his left leg wound is healed. He tolerated the compression wrap well on his right leg. He has no issues or complaints today. He denies signs of infection. 8/4; patient presents for 1 week follow-up. He continues to see improvement in wound healing to his right lower extremity. He has no issues or complaints today. He denies signs of infection. He does state that he went to the dermatologist and had some lesions removed on his face and left leg. He has been keeping these covered with a Band-Aid. 8/19; patient presents for follow-up. He is tolerated the compression wrap well. He has no issues or complaints today. He now only has 1 remaining wound. 9/1; patient presents for 1 week follow-up. He has tolerated the compression wrap well and has no issues or complaints today. He has been golfing 3 times weekly. He is in good spirits today. 9/15; patient presents for follow-up. He has no issues or complaints today. The wound is closed. Readmission 05/14/2022 Mr. Walton Vallez is an 81 year old male with a past medical history of uncontrolled type 2 diabetes on oral agents, diffuse large B-cell lymphoma and coronary artery disease status post CABG that presents the clinic for 3 wounds. He developed a right knee wound when he fell  out of his golf cart 3 weeks ago. He developed increased warmth and erythema to the right leg. While he was still waiting in the ED on 04/24/2022 for this issue he dropped his phone and created a wound to his left thigh. He has been using Xeroform to these areas. He ultimately required admission from the ED for right lower extremity cellulitis and was treated with Zyvox and Keflex. He also has a right plantar foot wound that started after having callus debridement by his podiatrist. He has had this wound for at least 6 weeks. For the foot wound he has not been keeping it covered and walks around barefoot. He has peripheral neuropathy However states that the wound site is painful. He denies any purulent drainage. He has swelling to this area. 11/21; patient presents for follow-up. He had an x-ray done at last clinic visit to the right foot that showed no focal bony erosion suggesting osteomyelitis. He completed his course of levofloxacin. He has been using his front offloading shoe. He has been using Xeroform to the knee wounds. He has no issues or complaints today. Patient History Information obtained from Patient. MASAKI, ROTHBAUER ([id number]) [id number] Page 5 of 8 Family History Cancer - Mother, Diabetes - Maternal Grandparents, Heart Disease - Paternal Grandparents, Hypertension - Paternal Grandparents, Seizures - Child, Stroke - Father, No family history of Hereditary Spherocytosis, Kidney Disease, Lung Disease, Thyroid Problems, Tuberculosis. Social History Never smoker, Marital Status - Divorced, Alcohol Use - Never, Drug Use - No History, Caffeine Use - Rarely. Medical History Eyes Patient has history of Cataracts - Surgery Hematologic/Lymphatic Patient has history of Anemia Respiratory Patient has history of Sleep Apnea Cardiovascular Patient has history of Arrhythmia, Congestive Heart Failure, Coronary Artery Disease, Hypertension Endocrine Patient has  history of Type II Diabetes Musculoskeletal Patient has history of  Osteoarthritis Neurologic Patient has history of Neuropathy Oncologic Patient has history of Received Chemotherapy Denies history of Received Radiation Hospitalization/Surgery History - cellulitis right leg 10/25-10/28/2023. - balloon angioplasty 03/2021. - 12/29/2021 CHF. Medical A Surgical History Notes nd Musculoskeletal Spinal Stenosis Oncologic 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 Objective Constitutional respirations regular, non-labored and within target range for patient.. Vitals Time Taken: 12:46 PM, Height: 72 in, Weight: 226 lbs, BMI: 30.6, Temperature: 97.7 F, Pulse: 101 bpm, Respiratory Rate: 17 breaths/min, Blood Pressure: 110/69 mmHg, Capillary Blood Glucose: 178 mg/dl. Cardiovascular 2+ dorsalis pedis/posterior tibialis pulses. Psychiatric pleasant and cooperative. General Notes: Right lower extremity: T the right knee there is an open wound with granulation tissue with mostly epithelization to the edges. T the plantar foot o o at the fifth met head there is epithelization to the previous wound site. No drainage noted on palpation. No pain on palpation. No increased warmth or erythema to the area. T the left upper thigh there is epithelization to the previous wound site o Integumentary (Hair, Skin) Wound #5 status is Open. Original cause of wound was Skin T ear/Laceration. The date acquired was: 03/28/2022. The wound has been in treatment 1 weeks. The wound is located on the Right Knee. The wound measures 0.5cm length x 0.3cm width x 0.1cm depth; 0.118cm^2 area and 0.012cm^3 volume. There is Fat Layer (Subcutaneous Tissue) exposed. There is no tunneling or undermining noted. There is a medium amount of serosanguineous drainage noted. There is large (67-100%) red granulation within the wound bed. There is a small (1-33%) amount of necrotic tissue within the wound bed including Eschar and  Adherent Slough. The periwound skin appearance exhibited: Dry/Scaly. Periwound temperature was noted as No Abnormality. Wound #6 status is Open. Original cause of wound was Skin T ear/Laceration. The date acquired was: 03/27/2022. The wound has been in treatment 1 weeks. The wound is located on the Left,Medial Upper Leg. The wound measures 0cm length x 0cm width x 0cm depth; 0cm^2 area and 0cm^3 volume. There is Fat Layer (Subcutaneous Tissue) exposed. There is a medium amount of serosanguineous drainage noted. There is large (67-100%) red granulation within the wound bed. There is a small (1-33%) amount of necrotic tissue within the wound bed including Adherent Slough. The periwound skin appearance had no abnormalities noted for moisture. Periwound temperature was noted as No Abnormality. Wound #7 status is Open. Original cause of wound was Gradually Appeared. The date acquired was: 03/31/2022. The wound has been in treatment 1 weeks. The wound is located on the [place]. The wound measures 0cm length x 0cm width x 0cm depth; 0cm^2 area and 0cm^3 volume. There is Fat Layer (Subcutaneous Tissue) exposed. There is a medium amount of serosanguineous drainage noted. There is large (67-100%) red granulation within the wound bed. There is a small (1-33%) amount of necrotic tissue within the wound bed including Eschar and Adherent Slough. The periwound skin appearance exhibited: Callus, Dry/Scaly. Periwound temperature was noted as No Abnormality. Assessment FITZ, MATSUO ([id number]) [id number] Page 6 of 8 Active Problems ICD-10 Type 2 diabetes mellitus with foot ulcer Non-pressure chronic ulcer of other part of right foot with other specified severity Non-pressure chronic ulcer of other part of left lower leg with fat layer exposed Non-pressure chronic ulcer of other part of right lower leg with fat layer exposed Patient's left upper thigh and right lateral foot  wound has healed. He has 1 remaining wound to the right knee. I recommended continuing Xeroform here.  His x-ray did not show concerning features for osteomyelitis. Last week I had recommended follow-up with a CT scan however since the wound is closed and he appears to be doing well I recommended holding off on obtaining a CT scan. Continue front offloading shoe to assure continued healing. Follow-up in 1 week. Plan Follow-up Appointments: Return Appointment in 1 week. - Dr. Heber [place] Other: - Cancel the CT scan. [place] office of wound care and patient to cancel the CT scan. Anesthetic: (In clinic) Topical Lidocaine 4% applied to wound bed Bathing/ Shower/ Hygiene: May shower with protection but do not get wound dressing(s) wet. Edema Control - Lymphedema / SCD / Other: Elevate legs to the level of the heart or above for 30 minutes daily and/or when sitting, a frequency of: - 3-4 times a day throughout the day. Avoid standing for long periods of time. Off-Loading: Wedge shoe to: - while walking and standing wear the shoe. limit standing and walking. No bare feet or just socks always wear shoes when walking. wear one more week. Home Health: New wound care orders this week; continue Home Health for wound care. May utilize formulary equivalent dressing for wound treatment orders unless otherwise specified. - right knee xeroform and foam border. once this week. Will see patient next week. Other Home Health Orders/Instructions: - [facility]. WOUND #5: - Knee Wound Laterality: Right Cleanser: Soap and Water Every Other Day/30 Days Discharge Instructions: May shower and wash wound with dial antibacterial soap and water prior to dressing change. Peri-Wound Care: Skin Prep Every Other Day/30 Days Discharge Instructions: Use skin prep as directed Prim Dressing: Xeroform Occlusive Gauze Dressing, 4x4 in Every Other Day/30 Days ary Discharge Instructions: Apply to wound bed as  instructed Secondary Dressing: Zetuvit Plus Silicone Border Dressing 4x4 (in/in) Every Other Day/30 Days Discharge Instructions: Apply silicone border over primary dressing as directed. 1. Front offloading shoe 2. Xeroform 3. Follow-up in 1 week Electronic Signature(s) Signed: 05/21/2022 3:46:12 PM By: Kalman Shan DO Entered By: Kalman Shan on 05/21/2022 13:42:12 -------------------------------------------------------------------------------- HxROS Details Patient Name: Date of Service: Richard [name], [name] 05/21/2022 1:00 PM Medical Record Number: [id number] Patient Account Number: [id number] Date of Birth/Sex: Treating RN: 05-Dec-1940 (81 y.o. M) Primary Care Provider: Roma Schanz Other Clinician: Referring Provider: Treating Provider/Extender: Tharon Aquas in Treatment: 1 Information Obtained From Patient Eyes Medical History: Positive for: Cataracts - Surgery HAYK, DIVIS ([id number]) [id number] Page 7 of 8 Hematologic/Lymphatic Medical History: Positive for: Anemia Respiratory Medical History: Positive for: Sleep Apnea Cardiovascular Medical History: Positive for: Arrhythmia; Congestive Heart Failure; Coronary Artery Disease; Hypertension Endocrine Medical History: Positive for: Type II Diabetes Time with diabetes: 20 years Treated with: Oral agents Blood sugar tested every day: Yes Tested : Q am Musculoskeletal Medical History: Positive for: Osteoarthritis Past Medical History Notes: Spinal Stenosis Neurologic Medical History: Positive for: Neuropathy Oncologic Medical History: Positive for: Received Chemotherapy Negative for: Received Radiation Past Medical History Notes: 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 HBO Extended History Items Eyes: Cataracts Immunizations Pneumococcal Vaccine: Received Pneumococcal Vaccination: Yes Received Pneumococcal Vaccination On or  After 60th Birthday: No Implantable Devices Yes Hospitalization / Surgery History Type of Hospitalization/Surgery cellulitis right leg 10/25-10/28/2023 balloon angioplasty 03/2021 12/29/2021 CHF Family and Social History Cancer: Yes - Mother; Diabetes: Yes - Maternal Grandparents; Heart Disease: Yes - Paternal Grandparents; Hereditary Spherocytosis: No; Hypertension: Yes - Paternal Grandparents; Kidney Disease: No; Lung Disease: No; Seizures: Yes - Child; Stroke: Yes - Father;  Thyroid Problems: No; Tuberculosis: No; Never smoker; Marital Status - Divorced; Alcohol Use: Never; Drug Use: No History; Caffeine Use: Rarely; Financial Concerns: No; Food, Clothing or Shelter Needs: No; Support System Lacking: No; Transportation Concerns: No Electronic Signature(s) Signed: 05/21/2022 3:46:12 PM By: Kalman Shan DO Entered By: Kalman Shan on 05/21/2022 13:39:20 [name] ([id number]) [id number] Page 8 of 8 -------------------------------------------------------------------------------- SuperBill Details Patient Name: Date of Service: Richard [name] [name] 05/21/2022 Medical Record Number: [id number] Patient Account Number: [id number] Date of Birth/Sex: Treating RN: 12-07-1940 (81 y.o. [name], Tammi Klippel Primary Care Provider: Roma Schanz Other Clinician: Referring Provider: Treating Provider/Extender: Tharon Aquas in Treatment: 1 Diagnosis Coding ICD-10 Codes Code Description [phone] Type 2 diabetes mellitus with foot ulcer L97.518 Non-pressure chronic ulcer of other part of right foot with other specified severity L97.822 Non-pressure chronic ulcer of other part of left lower leg with fat layer exposed L97.812 Non-pressure chronic ulcer of other part of right lower leg with fat layer exposed Facility Procedures : CPT4 Code: [id number] Description: 99214 - WOUND CARE VISIT-LEV 4 EST PT Modifier: Quantity:  1 Physician Procedures : CPT4 Code Description Modifier [id number] - WC PHYS LEVEL 3 - EST PT ICD-10 Diagnosis Description L97.518 Non-pressure chronic ulcer of other part of right foot with other specified severity L97.822 Non-pressure chronic ulcer of other part of left  lower leg with fat layer exposed L97.812 Non-pressure chronic ulcer of other part of right lower leg with fat layer exposed E11.621 Type 2 diabetes mellitus with foot ulcer Quantity: 1 Electronic Signature(s) Signed: 05/21/2022 3:46:12 PM By: Kalman Shan DO Entered By: Kalman Shan on 05/21/2022 13:42:28

## 2022-05-22 ENCOUNTER — Inpatient Hospital Stay (HOSPITAL_BASED_OUTPATIENT_CLINIC_OR_DEPARTMENT_OTHER): Payer: Medicare Other | Admitting: Family

## 2022-05-22 ENCOUNTER — Inpatient Hospital Stay: Payer: Medicare Other

## 2022-05-22 ENCOUNTER — Inpatient Hospital Stay: Payer: Medicare Other | Attending: Hematology & Oncology

## 2022-05-22 VITALS — HR 69 | Temp 97.8°F | Resp 18 | Ht 72.0 in | Wt 233.8 lb

## 2022-05-22 DIAGNOSIS — Z79899 Other long term (current) drug therapy: Secondary | ICD-10-CM | POA: Insufficient documentation

## 2022-05-22 DIAGNOSIS — D510 Vitamin B12 deficiency anemia due to intrinsic factor deficiency: Secondary | ICD-10-CM | POA: Insufficient documentation

## 2022-05-22 DIAGNOSIS — D519 Vitamin B12 deficiency anemia, unspecified: Secondary | ICD-10-CM | POA: Diagnosis not present

## 2022-05-22 DIAGNOSIS — D500 Iron deficiency anemia secondary to blood loss (chronic): Secondary | ICD-10-CM

## 2022-05-22 DIAGNOSIS — G629 Polyneuropathy, unspecified: Secondary | ICD-10-CM | POA: Insufficient documentation

## 2022-05-22 DIAGNOSIS — C8332 Diffuse large B-cell lymphoma, intrathoracic lymph nodes: Secondary | ICD-10-CM

## 2022-05-22 DIAGNOSIS — D509 Iron deficiency anemia, unspecified: Secondary | ICD-10-CM

## 2022-05-22 LAB — RETICULOCYTES
Immature Retic Fract: 23.6 % — ABNORMAL HIGH (ref 2.3–15.9)
RBC.: 4.6 MIL/uL (ref 4.22–5.81)
Retic Count, Absolute: 86.9 10*3/uL (ref 19.0–186.0)
Retic Ct Pct: 1.9 % (ref 0.4–3.1)

## 2022-05-22 LAB — IRON AND IRON BINDING CAPACITY (CC-WL,HP ONLY)
Iron: 110 ug/dL (ref 45–182)
Saturation Ratios: 28 % (ref 17.9–39.5)
TIBC: 396 ug/dL (ref 250–450)
UIBC: 286 ug/dL (ref 117–376)

## 2022-05-22 LAB — CMP (CANCER CENTER ONLY)
ALT: 16 U/L (ref 0–44)
AST: 21 U/L (ref 15–41)
Albumin: 4.5 g/dL (ref 3.5–5.0)
Alkaline Phosphatase: 58 U/L (ref 38–126)
Anion gap: 11 (ref 5–15)
BUN: 44 mg/dL — ABNORMAL HIGH (ref 8–23)
CO2: 27 mmol/L (ref 22–32)
Calcium: 10.4 mg/dL — ABNORMAL HIGH (ref 8.9–10.3)
Chloride: 100 mmol/L (ref 98–111)
Creatinine: 1.96 mg/dL — ABNORMAL HIGH (ref 0.61–1.24)
GFR, Estimated: 33 mL/min — ABNORMAL LOW (ref >60)
Glucose, Bld: 316 mg/dL — ABNORMAL HIGH (ref 70–99)
Potassium: 4.2 mmol/L (ref 3.5–5.1)
Sodium: 138 mmol/L (ref 135–145)
Total Bilirubin: 0.7 mg/dL (ref 0.3–1.2)
Total Protein: 7.2 g/dL (ref 6.5–8.1)

## 2022-05-22 LAB — CBC WITH DIFFERENTIAL (CANCER CENTER ONLY)
Abs Immature Granulocytes: 0.03 10*3/uL (ref 0.00–0.07)
Basophils Absolute: 0 10*3/uL (ref 0.0–0.1)
Basophils Relative: 1 %
Eosinophils Absolute: 0.2 10*3/uL (ref 0.0–0.5)
Eosinophils Relative: 2 %
HCT: 42.7 % (ref 39.0–52.0)
Hemoglobin: 13.6 g/dL (ref 13.0–17.0)
Immature Granulocytes: 0 %
Lymphocytes Relative: 19 %
Lymphs Abs: 1.6 10*3/uL (ref 0.7–4.0)
MCH: 29.4 pg (ref 26.0–34.0)
MCHC: 31.9 g/dL (ref 30.0–36.0)
MCV: 92.2 fL (ref 80.0–100.0)
Monocytes Absolute: 0.5 10*3/uL (ref 0.1–1.0)
Monocytes Relative: 6 %
Neutro Abs: 5.8 10*3/uL (ref 1.7–7.7)
Neutrophils Relative %: 72 %
Platelet Count: 174 10*3/uL (ref 150–400)
RBC: 4.63 MIL/uL (ref 4.22–5.81)
RDW: 17.5 % — ABNORMAL HIGH (ref 11.5–15.5)
WBC Count: 8.1 10*3/uL (ref 4.0–10.5)
nRBC: 0 % (ref 0.0–0.2)

## 2022-05-22 LAB — FERRITIN: Ferritin: 1468 ng/mL — ABNORMAL HIGH (ref 24–336)

## 2022-05-22 LAB — LACTATE DEHYDROGENASE: LDH: 133 U/L (ref 98–192)

## 2022-05-22 LAB — VITAMIN B12: Vitamin B-12: 271 pg/mL (ref 180–914)

## 2022-05-22 MED ORDER — CYANOCOBALAMIN 1000 MCG/ML IJ SOLN
1000.0000 ug | Freq: Once | INTRAMUSCULAR | Status: AC
  Administered 2022-05-22: 1000 ug via INTRAMUSCULAR
  Filled 2022-05-22: qty 1

## 2022-05-22 NOTE — Progress Notes (Signed)
Hematology and Oncology Follow Up Visit  Richard [name] [id number] 1941/06/13 81 y.o. 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 is here today for follow-up and B 12 injection. He is doing well and has recuperated nicely since having cellulitis in his toe last month. This required him to be hospitalized for antibiotics. The swelling is gone and he is now wearing a soft toeless boot. He has been following up with wound care and says he hopes to be released after this week.  He really wants to be back out on the golf course next week.  No fever, chills, n/v, cough, rash, dizziness, SOB, chest pain, palpitations, abdominal pain or changes in bowel or bladder habits.  Neuropathy in his feet unchanged from baseline.  No falls or syncope reported.  Appetite and hydration are good. Weight is stable at   ECOG Performance Status: 1 - Symptomatic but completely ambulatory  Medications:  Allergies as of 05/22/2022       Reactions   Ace Inhibitors Swelling, Other (See Comments)   Angioedema   Benazepril Swelling, Other (See Comments)   Angioedema; he is not a candidate for any angiotensin receptor blockers because of this significant allergic reaction. Because of a history of documented adverse serious drug reaction;Medi Alert bracelet  is recommended   Entresto [sacubitril-valsartan] Swelling, Other (See Comments)   First-in-Class Angiotensin Receptor Neprilysin Inhibitor- Med was "red-flagged" by the patient's pharmacy for him to NOT take!   Hctz [hydrochlorothiazide] Anaphylaxis, Swelling   Tongue and lip swelling   Aspirin Other (See Comments)    Gastritis, can aspirin not take 325 mg aspirin        Medication List        Accurate as of May 22, 2022  1:20 PM. If you have any questions, ask your nurse or doctor.          STOP taking these medications    levofloxacin 750 MG tablet Commonly known as: LEVAQUIN Stopped by: Lottie Dawson, NP   oxyCODONE 5 MG immediate release tablet Commonly known as: Oxy IR/ROXICODONE Stopped by: Lottie Dawson, NP       TAKE these medications    acetaminophen 325 MG tablet Commonly known as: TYLENOL Take 650 mg by mouth at bedtime as needed for moderate pain or headache.   allopurinol 300 MG tablet Commonly known as: ZYLOPRIM Take 450 mg by mouth daily.   aluminum hydroxide-magnesium carbonate 95-358 MG/15ML Susp Commonly known as: GAVISCON Take 15 mLs by mouth as needed for indigestion or heartburn.   apixaban 2.5 MG Tabs tablet Commonly known as: ELIQUIS Take 1 tablet (2.5 mg total) by mouth 2 (two) times daily.   atorvastatin 80 MG tablet Commonly known as: LIPITOR TAKE 1 TABLET BY MOUTH EVERYDAY AT BEDTIME What changed: See the new instructions.   azelastine 0.1 % nasal spray Commonly known as: ASTELIN PLACE 2 SPRAYS INTO BOTH NOSTRILS AT BEDTIME AS NEEDED FOR RHINITIS OR ALLERGIES.   CO Q-10 PO Take 1 capsule by mouth daily.   esomeprazole 40 MG capsule Commonly known as: NexIUM Take 1  capsule (40 mg total) by mouth daily. What changed: when to take this   ezetimibe 10 MG tablet Commonly known as: ZETIA TAKE 1 TABLET BY MOUTH EVERY DAY   Farxiga 10 MG Tabs tablet Generic drug: dapagliflozin propanediol Take 10 mg by mouth in the morning.   fenofibrate 160 MG tablet TAKE 1 TABLET BY MOUTH EVERY DAY   folic acid 1 MG tablet Commonly known as: FOLVITE TAKE 2 TABLETS BY MOUTH EVERY DAY What changed: when to take this   freestyle lancets USE ONCE A DAY TO CHECK BLOOD SUGAR.   FREESTYLE LITE test strip Generic drug: glucose blood USE TO TEST  BLOOD SUGAR ONCE A DAY. DX CODE: E11.9   glimepiride 2 MG tablet Commonly known as: AMARYL Take 1 tablet (2 mg total) by mouth in the morning and at bedtime.   icosapent Ethyl 1 g capsule Commonly known as: VASCEPA TAKE 2 CAPSULES BY MOUTH TWICE A DAY What changed: when to take this   ICY HOT ADVANCED PAIN RELIEF EX Apply 1 application  topically daily as needed (to painful sites).   metoprolol succinate 25 MG 24 hr tablet Commonly known as: TOPROL-XL Take 1 tablet (25 mg total) by mouth daily.   nitroGLYCERIN 0.4 MG SL tablet Commonly known as: NITROSTAT PLACE 1 TABLET (0.4 MG TOTAL) UNDER THE TONGUE EVERY 5 (FIVE) MINUTES AS NEEDED FOR CHEST PAIN.   potassium chloride SA 20 MEQ tablet Commonly known as: Klor-Con M20 Take 2 tablets (40 mEq total) by mouth daily.   pregabalin 150 MG capsule Commonly known as: LYRICA TAKE 1 CAPSULE BY MOUTH TWICE A DAY   psyllium 58.6 % powder Commonly known as: METAMUCIL Take 1 packet by mouth daily as needed (for constipation- mix and drink).   senna 8.6 MG Tabs tablet Commonly known as: SENOKOT Take 2 tablets (17.2 mg total) by mouth at bedtime. This is for constipation.  Stop taking if you start having diarrhea. What changed:  when to take this reasons to take this   torsemide 20 MG tablet Commonly known as: DEMADEX Take 2 tablets (40 mg total) by mouth in the morning. Do not take on 01/24/2022.   VITAMIN C PO Take 1 tablet by mouth daily with breakfast.        Allergies:  Allergies  Allergen Reactions   Ace Inhibitors Swelling and Other (See Comments)    Angioedema    Benazepril Swelling and Other (See Comments)    Angioedema; he is not a candidate for any angiotensin receptor blockers because of this significant allergic reaction. Because of a history of documented adverse serious drug reaction;Medi Alert bracelet  is recommended   Entresto [Sacubitril-Valsartan] Swelling and Other (See Comments)    First-in-Class  Angiotensin Receptor Neprilysin Inhibitor- Med was "red-flagged" by the patient's pharmacy for him to NOT take!   Hctz [Hydrochlorothiazide] Anaphylaxis and Swelling    Tongue and lip swelling    Aspirin Other (See Comments)    Gastritis, can aspirin not take 325 mg aspirin    Past Medical History, Surgical history, Social history, and Family History were reviewed and updated.  Review of Systems: All other 10 point review of systems is negative.   Physical Exam:  height is 6' (1.829 m) and weight is 233 lb 12.8 oz (106.1 kg). His oral temperature is 97.8 F (36.6 C). His pulse is 69. His respiration is 18 and oxygen saturation is 95%.   Wt Readings from Last 3 Encounters:  05/22/22 233 lb 12.8  oz (106.1 kg)  05/20/22 234 lb 9.6 oz (106.4 kg)  04/27/22 233 lb (105.7 kg)    Ocular: Sclerae unicteric, pupils equal, round and reactive to light Ear-nose-throat: Oropharynx clear, dentition fair Lymphatic: No cervical or supraclavicular adenopathy Lungs no rales or rhonchi, good excursion bilaterally Heart regular rate and rhythm, no murmur appreciated Abd soft, nontender, positive bowel sounds MSK no focal spinal tenderness, no joint edema Neuro: non-focal, well-oriented, appropriate affect Breasts: Deferred   Lab Results  Component Value Date   WBC 8.1 05/22/2022   HGB 13.6 05/22/2022   HCT 42.7 05/22/2022   MCV 92.2 05/22/2022   PLT 174 05/22/2022   Lab Results  Component Value Date   FERRITIN 1,409 (H) 04/17/2022   IRON 83 04/17/2022   TIBC 377 04/17/2022   UIBC 294 04/17/2022   IRONPCTSAT 22 04/17/2022   Lab Results  Component Value Date   RETICCTPCT 1.9 05/22/2022   RBC 4.60 05/22/2022   RETICCTABS 52.1 11/22/2011   No results found for: "KPAFRELGTCHN", "LAMBDASER", "[facility]" Lab Results  Component Value Date   IGA 159 03/09/2012   Lab Results  Component Value Date   ALBUMINELP 4.2 07/27/2018   MSPIKE Not Observed 07/27/2018     Chemistry       Component Value Date/Time   NA 139 04/30/2022 1153   NA 138 02/08/2022 0811   NA 144 06/27/2017 0857   K 4.5 04/30/2022 1153   K 3.9 06/27/2017 0857   CL 101 04/30/2022 1153   CL 103 06/27/2017 0857   CO2 28 04/30/2022 1153   CO2 26 06/27/2017 0857   BUN 31 (H) 04/30/2022 1153   BUN 46 (H) 02/08/2022 0811   BUN 12 06/27/2017 0857   CREATININE 1.67 (H) 04/30/2022 1153   CREATININE 1.80 (H) 04/17/2022 1300   CREATININE 1.65 (H) 04/18/2020 0956      Component Value Date/Time   CALCIUM 8.3 (L) 04/30/2022 1153   CALCIUM 9.2 06/27/2017 0857   ALKPHOS 122 (H) 04/30/2022 1153   ALKPHOS 128 (H) 06/27/2017 0857   AST 70 (H) 04/30/2022 1153   AST 19 04/17/2022 1300   ALT 71 (H) 04/30/2022 1153   ALT 21 04/17/2022 1300   ALT 24 06/27/2017 0857   BILITOT 0.7 04/30/2022 1153   BILITOT 0.6 04/17/2022 1300       Impression and Plan: Mr. Zietlow is a very pleasant 81 yo caucasian gentleman with diffuse large B-cell lymphoma (not "double hit" lymphoma). He completed treatment in February 2019.  Iron studies pending.  B 12 given.  Follow-up in 1 month.    Lottie Dawson, NP 11/22/20231:20 PM

## 2022-05-22 NOTE — Progress Notes (Signed)
JEHU, MCCAUSLIN ([id number]) [id number] Page 1 of 11 Visit Report for 05/21/2022 Arrival Information Details Patient Name: Date of Service: Richard [name] NK [name]. 05/21/2022 1:00 PM Medical Record Number: [id number] Patient Account Number: [id number] Date of Birth/Sex: Treating RN: Jul 07, 1940 (81 y.o. [name], [name] Primary Care Domonik Levario: Roma Schanz Other Clinician: Referring Alonnie Bieker: Treating Shabria Egley/Extender: Tharon Aquas in Treatment: 1 Visit Information History Since Last Visit Added or deleted any medications: No Patient Arrived: Ambulatory Any new allergies or adverse reactions: No Arrival Time: 12:46 Had a fall or experienced change in No Accompanied By: wife activities of daily living that may affect Transfer Assistance: None risk of falls: Patient Identification Verified: Yes Signs or symptoms of abuse/neglect since last visito No Secondary Verification Process Completed: Yes Hospitalized since last visit: No Patient Requires Transmission-Based Precautions: No Implantable device outside of the clinic excluding No Patient Has Alerts: Yes cellular tissue based products placed in the center Patient Alerts: Patient on Blood Thinner since last visit: Has Dressing in Place as Prescribed: Yes Pain Present Now: No Electronic Signature(s) Signed: 05/21/2022 4:11:21 PM By: Rhae Hammock RN Entered By: Rhae Hammock on 05/21/2022 12:46:21 -------------------------------------------------------------------------------- Clinic Level of Care Assessment Details Patient Name: Date of Service: Richard [name] NK [name]. 05/21/2022 1:00 PM Medical Record Number: [id number] Patient Account Number: [id number] Date of Birth/Sex: Treating RN: 31-Oct-1940 (81 y.o. [name] Primary Care Flo Berroa: Roma Schanz Other Clinician: Referring Shaneka Efaw: Treating Braidon Chermak/Extender: Tharon Aquas in Treatment: 1 Clinic Level of Care Assessment Items TOOL 4 Quantity Score X- 1 0 Use when only an EandM is performed on FOLLOW-UP visit ASSESSMENTS - Nursing Assessment / Reassessment X- 1 10 Reassessment of Co-morbidities (includes updates in patient status) X- 1 5 Reassessment of Adherence to Treatment Plan ASSESSMENTS - Wound and Skin A ssessment / Reassessment _0  - 0 Simple Wound Assessment / Reassessment - one wound X- 3 5 Complex Wound Assessment / Reassessment - multiple wounds X- 1 10 Dermatologic / Skin Assessment (not related to wound area) ASSESSMENTS - Focused Assessment X- 1 5 Circumferential Edema Measurements - multi extremities _1  - 0 Nutritional Assessment / Counseling / Intervention Richard [name], Richard [name] ([id number]) [id number] Page 2 of 11 _2  - 0 Lower Extremity Assessment (monofilament, tuning fork, pulses) _3  - 0 Peripheral Arterial Disease Assessment (using hand held doppler) ASSESSMENTS - Ostomy and/or Continence Assessment and Care _4  - 0 Incontinence Assessment and Management _5  - 0 Ostomy Care Assessment and Management (repouching, etc.) PROCESS - Coordination of Care _6  - 0 Simple Patient / Family Education for ongoing care X- 1 20 Complex (extensive) Patient / Family Education for ongoing care X- 1 10 Staff obtains Programmer, systems, Records, T Results / Process Orders est X- 1 10 Staff telephones HHA, Nursing Homes / Clarify orders / etc _7  - 0 Routine Transfer to another Facility (non-emergent condition) _8  - 0 Routine Hospital Admission (non-emergent condition) _9  - 0 New Admissions / Biomedical engineer / Ordering NPWT Apligraf, etc. , _10  - 0 Emergency Hospital Admission (emergent condition) _11  - 0 Simple Discharge Coordination X- 1 15 Complex (extensive) Discharge Coordination PROCESS - Special Needs _12  - 0 Pediatric / Minor Patient Management _13  - 0 Isolation Patient Management _14  -  0 Hearing / Language / Visual special needs _15  - 0 Assessment of Community assistance (transportation, D/C planning, etc.) _16  - 0 Additional assistance / Altered mentation _17  - 0 Support Surface(s) Assessment (bed, cushion, seat, etc.) INTERVENTIONS - Wound Cleansing / Measurement _18  -  0 Simple Wound Cleansing - one wound X- 3 5 Complex Wound Cleansing - multiple wounds X- 1 5 Wound Imaging (photographs - any number of wounds) _0  - 0 Wound Tracing (instead of photographs) _1  - 0 Simple Wound Measurement - one wound X- 3 5 Complex Wound Measurement - multiple wounds INTERVENTIONS - Wound Dressings X - Small Wound Dressing one or multiple wounds 1 10 _2  - 0 Medium Wound Dressing one or multiple wounds _3  - 0 Large Wound Dressing one or multiple wounds <WVPXTGGYIRSWNIOE>_7<\/OJJKKXFGHWEXHBZJ>_6  - 0 Application of Medications - topical <RCVELFYBOFBPZWCH>_8<\/NIDPOEUMPNTIRWER>_1  - 0 Application of Medications - injection INTERVENTIONS - Miscellaneous _6  - 0 External ear exam _7  - 0 Specimen Collection (cultures, biopsies, blood, body fluids, etc.) _8  - 0 Specimen(s) / Culture(s) sent or taken to Lab for analysis _9  - 0 Patient Transfer (multiple staff / Civil Service fast streamer / Similar devices) _10  - 0 Simple Staple / Suture removal (25 or less) _11  - 0 Complex Staple / Suture removal (26 or more) _12  - 0 Hypo / Hyperglycemic Management (close monitor of Blood Glucose) Richard [name], Richard [name] ([id number]) [id number] Page 3 of 11 _13  - 0 Ankle / Brachial Index (ABI) - do not check if billed separately X- 1 5 Vital Signs Has the patient been seen at the hospital within the last three years: Yes Total Score: 150 Level Of Care: New/Established - Level 4 Electronic Signature(s) Signed: 05/22/2022 10:43:44 AM By: Deon Pilling RN, BSN Entered By: Deon Pilling on 05/21/2022 13:09:46 -------------------------------------------------------------------------------- Encounter Discharge Information Details Patient Name: Date of Service: Richard [name], Richard Larsson  NK [name]. 05/21/2022 1:00 PM Medical Record Number: [id number] Patient Account Number: [id number] Date of Birth/Sex: Treating RN: Jul 30, 1940 (81 y.o. [name] Primary Care Khady Vandenberg: Roma Schanz Other Clinician: Referring Reeder Brisby: Treating Chessie Neuharth/Extender: Tharon Aquas in Treatment: 1 Encounter Discharge Information Items Discharge Condition: Stable Ambulatory Status: Ambulatory Discharge Destination: Home Transportation: Private Auto Accompanied By: sister Schedule Follow-up Appointment: Yes Clinical Summary of Care: Electronic Signature(s) Signed: 05/22/2022 10:43:44 AM By: Deon Pilling RN, BSN Entered By: Deon Pilling on 05/21/2022 13:10:19 -------------------------------------------------------------------------------- Lower Extremity Assessment Details Patient Name: Date of Service: Richard [name], Richard Larsson NK [name]. 05/21/2022 1:00 PM Medical Record Number: [id number] Patient Account Number: [id number] Date of Birth/Sex: Treating RN: [birth year] (81 y.o. [name], [name] Primary Care Mikko Lewellen: Roma Schanz Other Clinician: Referring Hernan Turnage: Treating Zeshan Sena/Extender: Tharon Aquas in Treatment: 1 Edema Assessment Assessed: [Left: No] Richard [name]: Yes] Edema: [Left: Ye] [Right: s] Calf Left: Right: Point of Measurement: 35 cm From Medial Instep 39 cm Ankle Left: Right: Point of Measurement: 14 cm From Medial Instep 23.5 cm Vascular Assessment Richard [name], Richard [name] ([id number]) [Right:[id number] Page 4 of 11] Pulses: Dorsalis Pedis Palpable: [Right:Yes] Posterior Tibial Palpable: [Right:Yes] Electronic Signature(s) Signed: 05/21/2022 4:11:21 PM By: Rhae Hammock RN Entered By: Rhae Hammock on 05/21/2022 12:50:38 -------------------------------------------------------------------------------- Multi Wound Chart Details Patient Name: Date of Service: Richard [name], Richard Larsson NK [name].  05/21/2022 1:00 PM Medical Record Number: [id number] Patient Account Number: [id number] Date of Birth/Sex: Treating RN: 1940/08/09 (81 y.o. M) Primary Care Rhilee Currin: Roma Schanz Other Clinician: Referring Jazae Gandolfi: Treating Nicole Hafley/Extender: Tharon Aquas in Treatment: 1 Vital Signs Height(in): 72 Capillary Blood Glucose(mg/dl): 178 Weight(lbs): 226 Pulse(bpm): 101 Body Mass Index(BMI): 30.6 Blood Pressure(mmHg): 110/69 Temperature(F): 97.7 Respiratory Rate(breaths/min): 17 [5:Photos:] Right Knee Left, Medial Upper Leg Right, Plantar Foot Wound Location: Skin Tear/Laceration Skin Tear/Laceration Gradually Appeared Wounding Event: Abrasion Abrasion Diabetic Wound/Ulcer of the Lower Primary Etiology: Extremity Cataracts, Anemia, Sleep  Apnea, Cataracts, Anemia, Sleep Apnea, Cataracts, Anemia, Sleep Apnea, Comorbid History: Arrhythmia, Congestive Heart Failure, Arrhythmia, Congestive Heart Failure, Arrhythmia, Congestive Heart Failure, Coronary Artery Disease, Coronary Artery Disease, Coronary Artery Disease, Hypertension, Type II Diabetes, Hypertension, Type II Diabetes, Hypertension, Type II Diabetes, Osteoarthritis, Neuropathy, Received Osteoarthritis, Neuropathy, Received Osteoarthritis, Neuropathy, Received Chemotherapy Chemotherapy Chemotherapy 03/28/2022 03/27/2022 03/31/2022 Date Acquired: _0 Weeks of Treatment: Open Open Open Wound Status: No No No Wound Recurrence: 0.5x0.3x0.1 0x0x0 0x0x0 Measurements [name] x W x D (cm) 0.118 0 0 A (cm) : rea 0.012 0 0 Volume (cm) : 96.40% 100.00% 100.00% % Reduction in Area: 96.40% 100.00% 100.00% % Reduction in Volume: Full Thickness Without Exposed Full Thickness Without Exposed Grade 2 Classification: Support Structures Support Structures Medium Medium Medium Exudate A mount: Serosanguineous Serosanguineous Serosanguineous Exudate Type: red, brown red, brown red, brown Exudate  Color: Large (67-100%) Large (67-100%) Large (67-100%) Granulation Amount: Red Red Red Granulation Quality: Small (1-33%) Small (1-33%) Small (1-33%) Necrotic Amount: Eschar, Adherent Richard [name], Adherent Slough Necrotic Tissue: Fat Layer (Subcutaneous Tissue): Yes Fat Layer (Subcutaneous Tissue): Yes Fat Layer (Subcutaneous Tissue): Yes Exposed Structures: Fascia: No Fascia: No Fascia: No Tendon: No Tendon: No Tendon: No Muscle: No Muscle: No Muscle: No Joint: No Joint: No Joint: No Richard [name], Richard [name] ([id number]) [id number] Page 5 of 11 Bone: No Bone: No Bone: No None None None Epithelialization: Callus: Yes Periwound Skin Texture: Dry/Scaly: Yes No Abnormalities Noted Dry/Scaly: Yes Periwound Skin Moisture: No Abnormality No Abnormality No Abnormality Temperature: Treatment Notes Wound #5 (Knee) Wound Laterality: Right Cleanser Soap and Water Discharge Instruction: May shower and wash wound with dial antibacterial soap and water prior to dressing change. Peri-Wound Care Skin Prep Discharge Instruction: Use skin prep as directed Topical Primary Dressing Xeroform Occlusive Gauze Dressing, 4x4 in Discharge Instruction: Apply to wound bed as instructed Secondary Dressing Zetuvit Plus Silicone Border Dressing 4x4 (in/in) Discharge Instruction: Apply silicone border over primary dressing as directed. Secured With Compression Wrap Compression Stockings Add-Ons Wound #6 (Upper Leg) Wound Laterality: Left, Medial Cleanser Peri-Wound Care Topical Primary Dressing Secondary Dressing Secured With Compression Wrap Compression Stockings Add-Ons Wound #7 (Foot) Wound Laterality: Plantar, Right Cleanser Peri-Wound Care Topical Primary Dressing Secondary Dressing Secured With Compression Wrap Compression Stockings Add-Ons Electronic Signature(s) Signed: 05/21/2022 3:46:12 PM By: Kalman Shan DO Entered By:  Kalman Shan on 05/21/2022 13:37:37 Richard [name] ([id number]) [id number] Page 6 of 11 -------------------------------------------------------------------------------- Multi-Disciplinary Care Plan Details Patient Name: Date of Service: Richard [name] NK [name]. 05/21/2022 1:00 PM Medical Record Number: [id number] Patient Account Number: [id number] Date of Birth/Sex: Treating RN: August 22, 1940 (82 y.o. [name] Primary Care Luken Shadowens: Roma Schanz Other Clinician: Referring Ama Mcmaster: Treating Kielan Dreisbach/Extender: Tharon Aquas in Treatment: 1 Active Inactive Pain, Acute or Chronic Nursing Diagnoses: Pain, acute or chronic: actual or potential Goals: Patient will verbalize adequate pain control and receive pain control interventions during procedures as needed Date Initiated: 05/14/2022 Target Resolution Date: 06/06/2022 Goal Status: Active Patient/caregiver will verbalize comfort level met Date Initiated: 05/14/2022 Target Resolution Date: 06/06/2022 Goal Status: Active Interventions: Assess comfort goal upon admission Provide education on pain management Treatment Activities: Administer pain control measures as ordered : 05/14/2022 Notes: Wound/Skin Impairment Nursing Diagnoses: Knowledge deficit related to ulceration/compromised skin integrity Goals: Patient/caregiver will verbalize understanding of skin care regimen Date Initiated: 05/14/2022 Target Resolution Date: 06/07/2022 Goal Status: Active Interventions: Assess patient/caregiver ability to perform ulcer/skin care regimen upon admission and as needed Assess ulceration(s) every visit  Provide education on ulcer and skin care Treatment Activities: Skin care regimen initiated : 05/14/2022 Topical wound management initiated : 05/14/2022 Notes: Electronic Signature(s) Signed: 05/22/2022 10:43:44 AM By: Deon Pilling RN, BSN Entered By: Deon Pilling on  05/21/2022 13:06:10 Richard [name] ([id number]) [id number] Page 7 of 11 -------------------------------------------------------------------------------- Pain Assessment Details Patient Name: Date of Service: Richard [name] NK [name]. 05/21/2022 1:00 PM Medical Record Number: [id number] Patient Account Number: [id number] Date of Birth/Sex: Treating RN: 1940-08-14 (81 y.o. [name], [name] Primary Care Arlette Schaad: Roma Schanz Other Clinician: Referring Kyli Sorter: Treating Keeli Roberg/Extender: Tharon Aquas in Treatment: 1 Active Problems Location of Pain Severity and Description of Pain Patient Has Paino No Site Locations Pain Management and Medication Current Pain Management: Electronic Signature(s) Signed: 05/21/2022 4:11:21 PM By: Rhae Hammock RN Entered By: Rhae Hammock on 05/21/2022 12:47:56 -------------------------------------------------------------------------------- Patient/Caregiver Education Details Patient Name: Date of Service: Richard [name] 11/21/2023andnbsp1:00 PM Medical Record Number: [id number] Patient Account Number: [id number] Date of Birth/Gender: Treating RN: 1940/08/29 (81 y.o. [name] Primary Care Physician: Roma Schanz Other Clinician: Referring Physician: Treating Physician/Extender: Tharon Aquas in Treatment: 1 Education Assessment Education Provided To: Patient Education Topics Provided Wound/Skin Impairment: Handouts: Skin Care Do's and Dont's Methods: Explain/Verbal Responses: Reinforcements needed Electronic Signature(s) Signed: 05/22/2022 10:43:44 AM By: Deon Pilling RN, BSN Richard [name] ([id number]) [id number] Page 8 of 11 Entered By: Deon Pilling on 05/21/2022 13:06:24 -------------------------------------------------------------------------------- Wound Assessment Details Patient Name: Date  of Service: Richard [name] NK [name]. 05/21/2022 1:00 PM Medical Record Number: [id number] Patient Account Number: [id number] Date of Birth/Sex: Treating RN: Nov 12, 1940 (81 y.o. [name], [name] Primary Care Grissel Tyrell: Roma Schanz Other Clinician: Referring Denys Labree: Treating Kadin Bera/Extender: Tharon Aquas in Treatment: 1 Wound Status Wound Number: 5 Primary Abrasion Etiology: Wound Location: Right Knee Wound Open Wounding Event: Skin Tear/Laceration Status: Date Acquired: 03/28/2022 Comorbid Cataracts, Anemia, Sleep Apnea, Arrhythmia, Congestive Heart Weeks Of Treatment: 1 History: Failure, Coronary Artery Disease, Hypertension, Type II Diabetes, Clustered Wound: No Osteoarthritis, Neuropathy, Received Chemotherapy Photos Wound Measurements Length: (cm) 0.5 Width: (cm) 0.3 Depth: (cm) 0.1 Area: (cm) 0.118 Volume: (cm) 0.012 % Reduction in Area: 96.4% % Reduction in Volume: 96.4% Epithelialization: None Tunneling: No Undermining: No Wound Description Classification: Full Thickness Without Exposed Suppor Exudate Amount: Medium Exudate Type: Serosanguineous Exudate Color: red, brown t Structures Foul Odor After Cleansing: No Slough/Fibrino Yes Wound Bed Granulation Amount: Large (67-100%) Exposed Structure Granulation Quality: Red Fascia Exposed: No Necrotic Amount: Small (1-33%) Fat Layer (Subcutaneous Tissue) Exposed: Yes Necrotic Quality: Eschar, Adherent Slough Tendon Exposed: No Muscle Exposed: No Joint Exposed: No Bone Exposed: No Periwound Skin Texture Texture Color No Abnormalities Noted: No No Abnormalities Noted: No Moisture Temperature / Pain No Abnormalities Noted: No Temperature: No Abnormality Dry / Scaly: Yes Treatment Notes Wound #5 (Knee) Wound Laterality: Right Richard [name], Richard [name] ([id number]) [id number] Page 9 of 11 Cleanser Soap and Water Discharge Instruction: May shower and  wash wound with dial antibacterial soap and water prior to dressing change. Peri-Wound Care Skin Prep Discharge Instruction: Use skin prep as directed Topical Primary Dressing Xeroform Occlusive Gauze Dressing, 4x4 in Discharge Instruction: Apply to wound bed as instructed Secondary Dressing Zetuvit Plus Silicone Border Dressing 4x4 (in/in) Discharge Instruction: Apply silicone border over primary dressing as directed. Secured With Compression Wrap Compression Stockings Environmental education officer) Signed: 05/21/2022 4:11:21 PM By: Rhae Hammock RN Entered By: Rhae Hammock on 05/21/2022 12:54:03 -------------------------------------------------------------------------------- Wound Assessment Details Patient Name:  Date of Service: Richard [name] NK [name]. 05/21/2022 1:00 PM Medical Record Number: [id number] Patient Account Number: [id number] Date of Birth/Sex: Treating RN: [birth year] (81 y.o. [name], [name] Primary Care Faustino Luecke: Roma Schanz Other Clinician: Referring Tarena Gockley: Treating Billye Nydam/Extender: Tharon Aquas in Treatment: 1 Wound Status Wound Number: 6 Primary Abrasion Etiology: Wound Location: Left, Medial Upper Leg Wound Open Wounding Event: Skin Tear/Laceration Status: Date Acquired: 03/27/2022 Comorbid Cataracts, Anemia, Sleep Apnea, Arrhythmia, Congestive Heart Weeks Of Treatment: 1 History: Failure, Coronary Artery Disease, Hypertension, Type II Diabetes, Clustered Wound: No Osteoarthritis, Neuropathy, Received Chemotherapy Photos Wound Measurements Length: (cm) 0 Width: (cm) 0 Depth: (cm) 0 Area: (cm) 0 Volume: (cm) 0 Richard [name], Richard [name] ([id number]) % Reduction in Area: 100% % Reduction in Volume: 100% Epithelialization: None [id number] Page 10 of 11 Wound Description Classification: Full Thickness Without Exposed Suppor Exudate Amount: Medium Exudate Type:  Serosanguineous Exudate Color: red, brown t Structures Foul Odor After Cleansing: No Slough/Fibrino Yes Wound Bed Granulation Amount: Large (67-100%) Exposed Structure Granulation Quality: Red Fascia Exposed: No Necrotic Amount: Small (1-33%) Fat Layer (Subcutaneous Tissue) Exposed: Yes Necrotic Quality: Adherent Slough Tendon Exposed: No Muscle Exposed: No Joint Exposed: No Bone Exposed: No Periwound Skin Texture Texture Color No Abnormalities Noted: No No Abnormalities Noted: No Moisture Temperature / Pain No Abnormalities Noted: Yes Temperature: No Abnormality Electronic Signature(s) Signed: 05/21/2022 4:11:21 PM By: Rhae Hammock RN Entered By: Rhae Hammock on 05/21/2022 12:53:43 -------------------------------------------------------------------------------- Wound Assessment Details Patient Name: Date of Service: Richard [name], Richard Larsson NK [name]. 05/21/2022 1:00 PM Medical Record Number: [id number] Patient Account Number: [id number] Date of Birth/Sex: Treating RN: [birth year] (81 y.o. [name], [name] Primary Care Kaulana Brindle: Roma Schanz Other Clinician: Referring Llewellyn Choplin: Treating Anea Fodera/Extender: Tharon Aquas in Treatment: 1 Wound Status Wound Number: 7 Primary Diabetic Wound/Ulcer of the Lower Extremity Etiology: Wound Location: Right, Plantar Foot Wound Open Wounding Event: Gradually Appeared Status: Date Acquired: 03/31/2022 Comorbid Cataracts, Anemia, Sleep Apnea, Arrhythmia, Congestive Heart Weeks Of Treatment: 1 History: Failure, Coronary Artery Disease, Hypertension, Type II Diabetes, Clustered Wound: No Osteoarthritis, Neuropathy, Received Chemotherapy Photos Wound Measurements Length: (cm) Width: (cm) Depth: (cm) Area: (cm) Volume: (cm) LUCHIANO, VISCOMI ([id number]) Wound Description Classification: Grade 2 Exudate Amount: Medium Exudate Type: Serosanguineous Exudate Color: red, brown Foul Odor After  Cleansing: Slough/Fibrino 0 % Reduction in Area: 100% 0 % Reduction in Volume: 100% 0 Epithelialization: None 0 0 [id number] Page 11 of 11 No Yes Wound Bed Granulation Amount: Large (67-100%) Exposed Structure Granulation Quality: Red Fascia Exposed: No Necrotic Amount: Small (1-33%) Fat Layer (Subcutaneous Tissue) Exposed: Yes Necrotic Quality: Eschar, Adherent Slough Tendon Exposed: No Muscle Exposed: No Joint Exposed: No Bone Exposed: No Periwound Skin Texture Texture Color No Abnormalities Noted: No No Abnormalities Noted: No Callus: Yes Temperature / Pain Temperature: No Abnormality Moisture No Abnormalities Noted: No Dry / Scaly: Yes Electronic Signature(s) Signed: 05/21/2022 4:11:21 PM By: Rhae Hammock RN Entered By: Rhae Hammock on 05/21/2022 12:53:19 -------------------------------------------------------------------------------- Vitals Details Patient Name: Date of Service: Richard [name], Richard Larsson NK [name]. 05/21/2022 1:00 PM Medical Record Number: [id number] Patient Account Number: [id number] Date of Birth/Sex: Treating RN: 1940-09-15 (81 y.o. [name], [name] Primary Care Nikeisha Klutz: Roma Schanz Other Clinician: Referring Jezabella Schriever: Treating Sherece Gambrill/Extender: Tharon Aquas in Treatment: 1 Vital Signs Time Taken: 12:46 Temperature (F): 97.7 Height (in): 72 Pulse (bpm): 101 Weight (lbs): 226 Respiratory Rate (breaths/min): 17 Body Mass Index (BMI): 30.6 Blood Pressure (mmHg): 110/69 Capillary Blood Glucose (mg/dl): 178 Reference  Range: 80 - 120 mg / dl Electronic Signature(s) Signed: 05/21/2022 4:11:21 PM By: Rhae Hammock RN Entered By: Rhae Hammock on 05/21/2022 12:47:38

## 2022-05-22 NOTE — Patient Instructions (Signed)
Prueba de vitamina B12 y folato Vitamin B12 and Folate Test Por qu me debo realizar esta prueba? La vitamina B12 y el folato (cido flico) son vitaminas B necesarias para producir glbulos rojos y para Consulting civil engineer salud del [place]. La vitamina B12 se encuentra en alimentos como carnes, huevos, productos lcteos y pescado. El folato se encuentra en las frutas, porotos y verduras de Scientist, research (life sciences). Algunos alimentos, como los cereales integrales, el pan y los cereales, contienen vitamina B12 (estn fortificados). Es posible que no tenga suficiente cantidad de vitamina B (tiene un dficit) si su dieta carece de esta vitamina. Tambin puede tener niveles bajos a causa de enfermedades o cirugas del estmago o el intestino delgado que interfieran en su capacidad de Tax adviser estas vitaminas de los alimentos. Es posible que se le haga una prueba de vitamina B12 y folato si: Tiene sntomas de dficit de vitamina B12 o folato, como cansancio (fatiga), dolor de cabeza, confusin, falta de equilibrio, u hormigueo y entumecimiento en las manos y los pies. Est embarazada o amamantando. Las mujeres embarazadas o que estn amamantando necesitan ms folato y quizs deban tomar un suplemento dietario. Tiene un valor bajo en el recuento de glbulos rojos (anemia). Es Ardelia Mems persona mayor y tiene confusin mental. Wilma Flavin enfermedad o afeccin que puede causar dficit de estas vitaminas B. Qu se analiza? Esta prueba mide la cantidad de vitamina B12 y folato en la sangre. Las pruebas de vitamina B12 y folato pueden hacerse juntas o por separado. Qu tipo de [place] se toma?  Para esta prueba, se extrae Truddie Coco de [place]. Por lo general, para extraerla, se introduce una aguja en un vaso sanguneo. Cmo debo prepararme para esta prueba? Siga las instrucciones del mdico con respecto a lo que puede comer y beber antes de la prueba. Informe al mdico acerca de lo siguiente: Todos los [employer]  [place], incluidos vitaminas, hierbas, gotas oftlmicas, cremas y medicamentos de venta libre. Cualquier afeccin mdica que tenga. Si est embarazada o podra estarlo. La frecuencia con la que consume alcohol. Cmo se informan los resultados? Los [employer] de la prueba se informarn como valores que indican la cantidad de vitamina B12 y Chartered certified accountant. Su mdico comparar sus resultados con los rangos normales que se establecieron luego de Optometrist la prueba a un grupo grande de personas (rangos de referencia). Los rangos de referencia pueden variar entre distintos laboratorios y hospitales. Los rangos de referencia habituales para esta prueba son los siguientes: Vitamina B12: de 160 a 950 pg/ml o 118 a 701 pmol/l (unidades SI). Folato: de 5 a 25 ng/dl o de 11 a 57 nmol/l (unidades SI). Center significan los resultados? Los [employer] se encuentran dentro del rango de referencia se consideran normales. Los niveles de vitamina B12 o folato que estn por debajo del rango de referencia pueden ser a causa de lo siguiente: [place] nutricin deficiente o una dieta vegetariana o vegana que no incluya alimentos provenientes de [place]. Padecer alcoholismo. Padecer ciertas enfermedades que dificulten la absorcin de la vitamina B12. Estas enfermedades incluyen la enfermedad de Crohn, pancreatitis crnica y fibrosis [place]. Tomar ciertos medicamentos. Haberse sometido a cirugas de estmago o intestino delgado. Los niveles elevados de vitamina B12 son poco frecuentes, pero pueden presentarse si usted tiene: Cncer. Enfermedad heptica. Los [employer] de folato pueden ocurrir si: Tiene anemia. Es vegetariano. Ha tenido una transfusin de sangre reciente. Hable con el mdico sobre lo que [employer]. Preguntas para hacerle al mdico  Consulte a su mdico o pregunte en el departamento donde se realiza la prueba acerca de lo siguiente: Cundo estarn disponibles mis  resultados? Cmo obtendr mis resultados? Cules son las opciones de tratamiento? Qu otras pruebas necesito? Cules son los prximos pasos que debo seguir? Resumen La vitamina B12 y el folato (cido flico) son vitaminas B necesarias para producir glbulos rojos y para Consulting civil engineer salud del [place]. Es posible que no tenga suficiente cantidad de vitamina B en el cuerpo si no consume suficiente cantidad en su dieta o si tiene una enfermedad que dificulte la absorcin de la vitamina B12. Esta prueba mide la cantidad de vitamina B12 y folato en la sangre. Esta prueba requiere que se extraiga [place] de [place]. Hable con el mdico sobre lo que [employer]. Esta informacin no tiene Marine scientist el consejo del mdico. Asegrese de hacerle al mdico cualquier pregunta que tenga. Document Revised: 02/28/2021 Document Reviewed: 02/28/2021 Elsevier Patient Education  [place].

## 2022-05-25 DIAGNOSIS — E1142 Type 2 diabetes mellitus with diabetic polyneuropathy: Secondary | ICD-10-CM | POA: Diagnosis not present

## 2022-05-25 DIAGNOSIS — S81002D Unspecified open wound, left knee, subsequent encounter: Secondary | ICD-10-CM | POA: Diagnosis not present

## 2022-05-25 DIAGNOSIS — L03115 Cellulitis of right lower limb: Secondary | ICD-10-CM | POA: Diagnosis not present

## 2022-05-25 DIAGNOSIS — L988 Other specified disorders of the skin and subcutaneous tissue: Secondary | ICD-10-CM | POA: Diagnosis not present

## 2022-05-25 DIAGNOSIS — S81001D Unspecified open wound, right knee, subsequent encounter: Secondary | ICD-10-CM | POA: Diagnosis not present

## 2022-05-25 DIAGNOSIS — I130 Hypertensive heart and chronic kidney disease with heart failure and stage 1 through stage 4 chronic kidney disease, or unspecified chronic kidney disease: Secondary | ICD-10-CM | POA: Diagnosis not present

## 2022-05-27 ENCOUNTER — Other Ambulatory Visit (INDEPENDENT_AMBULATORY_CARE_PROVIDER_SITE_OTHER): Payer: Medicare Other

## 2022-05-27 ENCOUNTER — Telehealth: Payer: Self-pay | Admitting: Family Medicine

## 2022-05-27 DIAGNOSIS — R945 Abnormal results of liver function studies: Secondary | ICD-10-CM

## 2022-05-27 LAB — COMPREHENSIVE METABOLIC PANEL
ALT: 19 U/L (ref 0–53)
AST: 22 U/L (ref 0–37)
Albumin: 4.5 g/dL (ref 3.5–5.2)
Alkaline Phosphatase: 60 U/L (ref 39–117)
BUN: 54 mg/dL — ABNORMAL HIGH (ref 6–23)
CO2: 30 mEq/L (ref 19–32)
Calcium: 9.3 mg/dL (ref 8.4–10.5)
Chloride: 96 mEq/L (ref 96–112)
Creatinine, Ser: 2.05 mg/dL — ABNORMAL HIGH (ref 0.40–1.50)
GFR: 29.34 mL/min — ABNORMAL LOW (ref >60.00)
Glucose, Bld: 173 mg/dL — ABNORMAL HIGH (ref 70–99)
Potassium: 4 mEq/L (ref 3.5–5.1)
Sodium: 138 mEq/L (ref 135–145)
Total Bilirubin: 0.7 mg/dL (ref 0.2–1.2)
Total Protein: 7.1 g/dL (ref 6.0–8.3)

## 2022-05-27 LAB — GAMMA GT: GGT: 57 U/L — ABNORMAL HIGH (ref 7–51)

## 2022-05-27 NOTE — Telephone Encounter (Signed)
Lab Results  Component Value Date   HGBA1C 8.1 (H) 04/25/2022  It looks like his A1c was tested outside of our office about a month ago, see above.  Previously in the sixes, but metformin had to be discontinued due to renal insufficiency With A1c of 8.1 his average blood sugar is likely around 200 Called patient, advised that I do not think blood sugars of 180-200 are dangerous in the short-term, and given his age A1c of 8 may be okay.  I will send a message to his PCP, Dr. Etter Sjogren in case she wants to make any other changes when she is back in the office He states understanding and agreement

## 2022-05-27 NOTE — Telephone Encounter (Signed)
Pt called stating that his blood sugar is running on th high side fasting. Pt stated that his BS has been about 180-200 fasting for the past couple of days. Please Advise.

## 2022-05-28 ENCOUNTER — Encounter (HOSPITAL_COMMUNITY): Payer: Self-pay

## 2022-05-28 ENCOUNTER — Ambulatory Visit (HOSPITAL_COMMUNITY): Payer: Medicare Other

## 2022-05-28 ENCOUNTER — Encounter (HOSPITAL_BASED_OUTPATIENT_CLINIC_OR_DEPARTMENT_OTHER): Payer: Medicare Other | Admitting: Internal Medicine

## 2022-05-28 DIAGNOSIS — E11621 Type 2 diabetes mellitus with foot ulcer: Secondary | ICD-10-CM | POA: Diagnosis not present

## 2022-05-28 DIAGNOSIS — S81002D Unspecified open wound, left knee, subsequent encounter: Secondary | ICD-10-CM | POA: Diagnosis not present

## 2022-05-28 DIAGNOSIS — L97518 Non-pressure chronic ulcer of other part of right foot with other specified severity: Secondary | ICD-10-CM

## 2022-05-28 DIAGNOSIS — L03115 Cellulitis of right lower limb: Secondary | ICD-10-CM | POA: Diagnosis not present

## 2022-05-28 DIAGNOSIS — L97812 Non-pressure chronic ulcer of other part of right lower leg with fat layer exposed: Secondary | ICD-10-CM | POA: Diagnosis not present

## 2022-05-28 DIAGNOSIS — I509 Heart failure, unspecified: Secondary | ICD-10-CM | POA: Diagnosis not present

## 2022-05-28 DIAGNOSIS — L97822 Non-pressure chronic ulcer of other part of left lower leg with fat layer exposed: Secondary | ICD-10-CM

## 2022-05-28 DIAGNOSIS — Z45010 Encounter for checking and testing of cardiac pacemaker pulse generator [battery]: Secondary | ICD-10-CM | POA: Diagnosis not present

## 2022-05-28 DIAGNOSIS — E1142 Type 2 diabetes mellitus with diabetic polyneuropathy: Secondary | ICD-10-CM | POA: Diagnosis not present

## 2022-05-28 DIAGNOSIS — I442 Atrioventricular block, complete: Secondary | ICD-10-CM | POA: Diagnosis not present

## 2022-05-28 DIAGNOSIS — S81001D Unspecified open wound, right knee, subsequent encounter: Secondary | ICD-10-CM | POA: Diagnosis not present

## 2022-05-28 DIAGNOSIS — I130 Hypertensive heart and chronic kidney disease with heart failure and stage 1 through stage 4 chronic kidney disease, or unspecified chronic kidney disease: Secondary | ICD-10-CM | POA: Diagnosis not present

## 2022-05-28 DIAGNOSIS — L988 Other specified disorders of the skin and subcutaneous tissue: Secondary | ICD-10-CM | POA: Diagnosis not present

## 2022-05-28 DIAGNOSIS — I110 Hypertensive heart disease with heart failure: Secondary | ICD-10-CM | POA: Diagnosis not present

## 2022-05-28 NOTE — Progress Notes (Signed)
Richard, [name] ([id number]) [id number] Page 1 of 7 Visit Report for 05/28/2022 Arrival Information Details Patient Name: Date of Service: Richard [name] 05/28/2022 2:45 PM Medical Record Number: [id number] Patient Account Number: [id number] Date of Birth/Sex: Treating RN: [birth year] (81 y.o. [name] Primary Care Sita Mangen: Roma Schanz Other Clinician: Referring Xylan Sheils: Treating Kaegan Hettich/Extender: Tharon Aquas in Treatment: 2 Visit Information History Since Last Visit Added or deleted any medications: No Patient Arrived: Ambulatory Any new allergies or adverse reactions: No Arrival Time: 15:10 Had a fall or experienced change in No Accompanied By: self activities of daily living that may affect Transfer Assistance: None risk of falls: Patient Identification Verified: Yes Signs or symptoms of abuse/neglect since last visito No Secondary Verification Process Completed: Yes Hospitalized since last visit: No Patient Requires Transmission-Based Precautions: No Implantable device outside of the clinic excluding No Patient Has Alerts: Yes cellular tissue based products placed in the center Patient Alerts: Patient on Blood Thinner since last visit: Has Dressing in Place as Prescribed: Yes Pain Present Now: No Electronic Signature(s) Signed: 05/28/2022 4:47:08 PM By: Deon Pilling RN, BSN Entered By: Deon Pilling on 05/28/2022 15:22:07 -------------------------------------------------------------------------------- Clinic Level of Care Assessment Details Patient Name: Date of Service: Richard [name] Richard L. 05/28/2022 2:45 PM Medical Record Number: [id number] Patient Account Number: [id number] Date of Birth/Sex: Treating RN: 1940/12/06 (81 y.o. [name] Primary Care Ange [name]: Roma Schanz Other Clinician: Referring Shanetra Blumenstock: Treating Jacksen Isip/Extender: Tharon Aquas  in Treatment: 2 Clinic Level of Care Assessment Items TOOL 4 Quantity Score X- 1 0 Use when only an EandM is performed on FOLLOW-UP visit ASSESSMENTS - Nursing Assessment / Reassessment X- 1 10 Reassessment of Co-morbidities (includes updates in patient status) X- 1 5 Reassessment of Adherence to Treatment Plan ASSESSMENTS - Wound and Skin A ssessment / Reassessment X - Simple Wound Assessment / Reassessment - one wound 1 5 '[]'$  - 0 Complex Wound Assessment / Reassessment - multiple wounds X- 1 10 Dermatologic / Skin Assessment (not related to wound area) ASSESSMENTS - Focused Assessment '[]'$  - 0 Circumferential Edema Measurements - multi extremities '[]'$  - 0 Nutritional Assessment / Counseling / Intervention Richard [name], Richard [name] ([id number]) [id number] Page 2 of 7 '[]'$  - 0 Lower Extremity Assessment (monofilament, tuning fork, pulses) '[]'$  - 0 Peripheral Arterial Disease Assessment (using hand held doppler) ASSESSMENTS - Ostomy and/or Continence Assessment and Care '[]'$  - 0 Incontinence Assessment and Management '[]'$  - 0 Ostomy Care Assessment and Management (repouching, etc.) PROCESS - Coordination of Care X - Simple Patient / Family Education for ongoing care 1 15 '[]'$  - 0 Complex (extensive) Patient / Family Education for ongoing care X- 1 10 Staff obtains Programmer, systems, Records, T Results / Process Orders est '[]'$  - 0 Staff telephones HHA, Nursing Homes / Clarify orders / etc '[]'$  - 0 Routine Transfer to another Facility (non-emergent condition) '[]'$  - 0 Routine Hospital Admission (non-emergent condition) '[]'$  - 0 New Admissions / Biomedical engineer / Ordering NPWT Apligraf, etc. , '[]'$  - 0 Emergency Hospital Admission (emergent condition) X- 1 10 Simple Discharge Coordination '[]'$  - 0 Complex (extensive) Discharge Coordination PROCESS - Special Needs '[]'$  - 0 Pediatric / Minor Patient Management '[]'$  - 0 Isolation Patient Management '[]'$  - 0 Hearing / Language /  Visual special needs '[]'$  - 0 Assessment of Community assistance (transportation, D/C planning, etc.) '[]'$  - 0 Additional assistance / Altered mentation '[]'$  - 0 Support Surface(s) Assessment (bed, cushion, seat, etc.) INTERVENTIONS - Wound  Cleansing / Measurement X - Simple Wound Cleansing - one wound 1 5 '[]'$  - 0 Complex Wound Cleansing - multiple wounds X- 1 5 Wound Imaging (photographs - any number of wounds) '[]'$  - 0 Wound Tracing (instead of photographs) X- 1 5 Simple Wound Measurement - one wound '[]'$  - 0 Complex Wound Measurement - multiple wounds INTERVENTIONS - Wound Dressings '[]'$  - 0 Small Wound Dressing one or multiple wounds '[]'$  - 0 Medium Wound Dressing one or multiple wounds '[]'$  - 0 Large Wound Dressing one or multiple wounds '[]'$  - 0 Application of Medications - topical '[]'$  - 0 Application of Medications - injection INTERVENTIONS - Miscellaneous '[]'$  - 0 External ear exam '[]'$  - 0 Specimen Collection (cultures, biopsies, blood, body fluids, etc.) '[]'$  - 0 Specimen(s) / Culture(s) sent or taken to Lab for analysis '[]'$  - 0 Patient Transfer (multiple staff / Civil Service fast streamer / Similar devices) '[]'$  - 0 Simple Staple / Suture removal (25 or less) '[]'$  - 0 Complex Staple / Suture removal (26 or more) '[]'$  - 0 Hypo / Hyperglycemic Management (close monitor of Blood Glucose) Richard [name], Richard [name] ([id number]) [id number] Page 3 of 7 '[]'$  - 0 Ankle / Brachial Index (ABI) - do not check if billed separately X- 1 5 Vital Signs Has the patient been seen at the hospital within the last three years: Yes Total Score: 85 Level Of Care: New/Established - Level 3 Electronic Signature(s) Signed: 05/28/2022 4:47:08 PM By: Deon Pilling RN, BSN Entered By: Deon Pilling on 05/28/2022 15:25:11 -------------------------------------------------------------------------------- Encounter Discharge Information Details Patient Name: Date of Service: Richard [name], Richard [name] Richard L. 05/28/2022 2:45  PM Medical Record Number: [id number] Patient Account Number: [id number] Date of Birth/Sex: Treating RN: 07-Jun-1941 (81 y.o. [name] Primary Care Teriah Muela: Roma Schanz Other Clinician: Referring Dannon Perlow: Treating Reesa Gotschall/Extender: Tharon Aquas in Treatment: 2 Encounter Discharge Information Items Discharge Condition: Stable Ambulatory Status: Ambulatory Discharge Destination: Home Transportation: Private Auto Accompanied By: self Schedule Follow-up Appointment: No Clinical Summary of Care: Electronic Signature(s) Signed: 05/28/2022 4:47:08 PM By: Deon Pilling RN, BSN Entered By: Deon Pilling on 05/28/2022 15:25:33 -------------------------------------------------------------------------------- Lower Extremity Assessment Details Patient Name: Date of Service: Richard [name] Richard L. 05/28/2022 2:45 PM Medical Record Number: [id number] Patient Account Number: [id number] Date of Birth/Sex: Treating RN: 09/26/1940 (81 y.o. [name] Primary Care Miquela Costabile: Roma Schanz Other Clinician: Referring Renleigh Ouellet: Treating Lajeana Strough/Extender: Tharon Aquas in Treatment: 2 Edema Assessment Assessed: [Left: No] Patrice Paradise: No] [Left: Edema] [Right: :] Calf Left: Right: Point of Measurement: 35 cm From Medial Instep Ankle Left: Right: Point of Measurement: 14 cm From Medial Instep Electronic Signature(s) Signed: 05/28/2022 4:47:08 PM By: Deon Pilling RN, BSN Richard [name], Richard [name] 05/28/2022 4:47:08 PM By: Deon Pilling RN, BSN Signed: Carlean Jews ([id number]) [id number] Page 4 of 7 Entered By: Deon Pilling on 05/28/2022 15:22:38 -------------------------------------------------------------------------------- Multi Wound Chart Details Patient Name: Date of Service: Richard [name] 05/28/2022 2:45 PM Medical Record Number: [id number] Patient Account Number: [id number] Date of Birth/Sex: Treating  RN: [birth year] (81 y.o. M) Primary Care Chandel Zaun: Roma Schanz Other Clinician: Referring Marius Betts: Treating Tyanna Hach/Extender: Tharon Aquas in Treatment: 2 Vital Signs Height(in): 72 Pulse(bpm): 17 Weight(lbs): 226 Blood Pressure(mmHg): 110/71 Body Mass Index(BMI): 30.6 Temperature(F): 98.3 Respiratory Rate(breaths/min): 20 [5:Photos:] [N/A:N/A] Right Knee N/A N/A Wound Location: Skin T ear/Laceration N/A N/A Wounding Event: Abrasion N/A N/A Primary Etiology: Cataracts, Anemia, Sleep Apnea, N/A N/A Comorbid History: Arrhythmia, Congestive Heart Failure, Coronary Artery Disease, Hypertension, Type II  Diabetes, Osteoarthritis, Neuropathy, Received Chemotherapy 03/28/2022 N/A N/A Date Acquired: 2 N/A N/A Weeks of Treatment: Healed - Epithelialized N/A N/A Wound Status: No N/A N/A Wound Recurrence: 0x0x0 N/A N/A Measurements L x W x D (cm) 0 N/A N/A A (cm) : rea 0 N/A N/A Volume (cm) : 100.00% N/A N/A % Reduction in Area: 100.00% N/A N/A % Reduction in Volume: Full Thickness Without Exposed N/A N/A Classification: Support Structures Medium N/A N/A Exudate A mount: Serosanguineous N/A N/A Exudate Type: red, brown N/A N/A Exudate Color: Large (67-100%) N/A N/A Granulation Amount: Red N/A N/A Granulation Quality: Small (1-33%) N/A N/A Necrotic Amount: Eschar, Adherent Slough N/A N/A Necrotic Tissue: Fat Layer (Subcutaneous Tissue): Yes N/A N/A Exposed Structures: Fascia: No Tendon: No Muscle: No Joint: No Bone: No None N/A N/A Epithelialization: Dry/Scaly: Yes N/A N/A Periwound Skin Moisture: No Abnormality N/A N/A Temperature: Treatment Notes Electronic Signature(s) Richard [name], Richard [name] ([id number]) [id number] Page 5 of 7 Signed: 05/28/2022 4:03:55 PM By: Kalman Shan DO Entered By: Kalman Shan on 05/28/2022  15:51:26 -------------------------------------------------------------------------------- Multi-Disciplinary Care Plan Details Patient Name: Date of Service: Richard [name], Richard [name] Richard L. 05/28/2022 2:45 PM Medical Record Number: [id number] Patient Account Number: [id number] Date of Birth/Sex: Treating RN: 19-Jul-1940 (81 y.o. [name] Primary Care Geroldine Esquivias: Roma Schanz Other Clinician: Referring Eldwin Volkov: Treating Ahria Slappey/Extender: Tharon Aquas in Treatment: 2 Active Inactive Electronic Signature(s) Signed: 05/28/2022 4:47:08 PM By: Deon Pilling RN, BSN Entered By: Deon Pilling on 05/28/2022 15:24:02 -------------------------------------------------------------------------------- Pain Assessment Details Patient Name: Date of Service: Richard [name] Richard L. 05/28/2022 2:45 PM Medical Record Number: [id number] Patient Account Number: [id number] Date of Birth/Sex: Treating RN: 01-Oct-1940 (81 y.o. [name] Primary Care Rahi Chandonnet: Roma Schanz Other Clinician: Referring Rochanda Harpham: Treating Skyelynn Rambeau/Extender: Tharon Aquas in Treatment: 2 Active Problems Location of Pain Severity and Description of Pain Patient Has Paino No Site Locations Rate the pain. Current Pain Level: 0 Pain Management and Medication Current Pain Management: Medication: No Cold Application: No Rest: No Massage: No Activity: No T.E.N.S.: No Heat Application: No Leg drop or elevation: No Is the Current Pain Management Adequate: Adequate Richard [name], Richard [name] ([id number]) [id number] Page 6 of 7 How does your wound impact your activities of daily livingo Sleep: No Bathing: No Appetite: No Relationship With Others: No Bladder Continence: No Emotions: No Bowel Continence: No Work: No Toileting: No Drive: No Dressing: No Hobbies: No Electronic Signature(s) Signed: 05/28/2022 4:47:08 PM By: Deon Pilling RN,  BSN Entered By: Deon Pilling on 05/28/2022 15:22:29 -------------------------------------------------------------------------------- Wound Assessment Details Patient Name: Date of Service: Richard [name] Richard L. 05/28/2022 2:45 PM Medical Record Number: [id number] Patient Account Number: [id number] Date of Birth/Sex: Treating RN: Mar 31, 1941 (81 y.o. Richard [name], Richard [name] Primary Care Chaunte Hornbeck: Roma Schanz Other Clinician: Referring Kerrie Timm: Treating Yazmeen Woolf/Extender: Tharon Aquas in Treatment: 2 Wound Status Wound Number: 5 Primary Abrasion Etiology: Wound Location: Right Knee Wound Healed - Epithelialized Wounding Event: Skin Tear/Laceration Status: Date Acquired: 03/28/2022 Comorbid Cataracts, Anemia, Sleep Apnea, Arrhythmia, Congestive Heart Weeks Of Treatment: 2 History: Failure, Coronary Artery Disease, Hypertension, Type II Diabetes, Clustered Wound: No Osteoarthritis, Neuropathy, Received Chemotherapy Photos Wound Measurements Length: (cm) Width: (cm) Depth: (cm) Area: (cm) Volume: (cm) 0 % Reduction in Area: 100% 0 % Reduction in Volume: 100% 0 Epithelialization: None 0 0 Wound Description Classification: Full Thickness Without Exposed Sup Exudate Amount: Medium Exudate Type: Serosanguineous Exudate Color: red, brown port Structures Foul Odor After Cleansing: No Slough/Fibrino Yes Wound Bed  Granulation Amount: Large (67-100%) Exposed Structure Granulation Quality: Red Fascia Exposed: No Necrotic Amount: Small (1-33%) Fat Layer (Subcutaneous Tissue) Exposed: Yes Necrotic Quality: Eschar, Adherent Slough Tendon Exposed: No Muscle Exposed: No Joint Exposed: No Richard [name], Richard [name] ([id number]) [id number] Page 7 of 7 Bone Exposed: No Periwound Skin Texture Texture Color No Abnormalities Noted: No No Abnormalities Noted: No Moisture Temperature / Pain No Abnormalities Noted: No Temperature: No  Abnormality Dry / Scaly: Yes Electronic Signature(s) Signed: 05/28/2022 4:47:08 PM By: Deon Pilling RN, BSN Entered By: Deon Pilling on 05/28/2022 15:21:31 -------------------------------------------------------------------------------- Vitals Details Patient Name: Date of Service: Richard [name], Richard [name] Richard L. 05/28/2022 2:45 PM Medical Record Number: [id number] Patient Account Number: [id number] Date of Birth/Sex: Treating RN: [birth year] (81 y.o. [name] Primary Care Yulieth Carrender: Roma Schanz Other Clinician: Referring Valin Massie: Treating Denika Krone/Extender: Tharon Aquas in Treatment: 2 Vital Signs Time Taken: 15:10 Temperature (F): 98.3 Height (in): 72 Pulse (bpm): 82 Weight (lbs): 226 Respiratory Rate (breaths/min): 20 Body Mass Index (BMI): 30.6 Blood Pressure (mmHg): 110/71 Reference Range: 80 - 120 mg / dl Electronic Signature(s) Signed: 05/28/2022 4:47:08 PM By: Deon Pilling RN, BSN Entered By: Deon Pilling on 05/28/2022 15:22:22

## 2022-05-28 NOTE — Progress Notes (Signed)
Richard [name], Richard [name] ([id number]) [id number] Page 1 of 7 Visit Report for 05/28/2022 Chief Complaint Document Details Patient Name: Date of Service: Richard [name] [name] 05/28/2022 2:45 PM Medical Record Number: [id number] Patient Account Number: [id number] Date of Birth/Sex: Treating RN: August 19, 1940 (81 y.o. M) Primary Care Provider: Roma [name] Other Clinician: Referring Provider: Treating Provider/Extender: Richard [name] in Treatment: 2 Information Obtained from: Patient Chief Complaint 01/12/2021; Wounds to his left upper extremity and bilateral lower extremities. 05/14/2022; bilateral lower extremity wounds, right plantar foot wound Electronic Signature(s) Signed: 05/28/2022 4:03:55 PM By: Richard [name] Entered By: Richard [name] on 05/28/2022 15:51:32 -------------------------------------------------------------------------------- HPI Details Patient Name: Date of Service: Richard [name], [name] 05/28/2022 2:45 PM Medical Record Number: [id number] Patient Account Number: [id number] Date of Birth/Sex: Treating RN: Nov 04, 1940 (81 y.o. M) Primary Care Provider: Roma [name] Other Clinician: Referring Provider: Treating Provider/Extender: Richard [name] in Treatment: 2 History of Present Illness HPI Description: Admission 7/15 [name] is an 81 year old male with a past medical history of type 2 diabetes on oral agents, diffuse large B-cell lymphoma, and coronary artery disease status post CABG that presents to the clinic for wounds to his left upper extremity and bilateral lower extremities. He states that he went to urgent care to be evaluated for a fever. And he was instructed to go to the ED to be further evaluated for possible sepsis. Unfortunately he passed out in his car After his appointment and was not discovered for another 3 hours. He was eventually discovered and EMS  had to take him out of his vehicle. This is when he developed abrasions to his left arm and legs. He has been using Xeroform to his wound beds every other day. He reports improvement to all the wounds except for the one on the back of his right leg. He denies pain or signs of infection. 7/22; patient presents for 1 week follow-up. He has no issues or complaints today. He has tolerated the compression wrap well on his right lower extremity. He has been able to [name] the dressing changes without issues to his left leg and left arm. His left arm wound has healed. He denies signs of infection. 7/29; patient presents for 1 week follow-up. He reports that his left leg wound is healed. He tolerated the compression wrap well on his right leg. He has no issues or complaints today. He denies signs of infection. 8/4; patient presents for 1 week follow-up. He continues to see improvement in wound healing to his right lower extremity. He has no issues or complaints today. He denies signs of infection. He does state that he went to the dermatologist and had some lesions removed on his face and left leg. He has been keeping these covered with a Band-Aid. 8/19; patient presents for follow-up. He is tolerated the compression wrap well. He has no issues or complaints today. He now only has 1 remaining wound. 9/1; patient presents for 1 week follow-up. He has tolerated the compression wrap well and has no issues or complaints today. He has been golfing 3 times weekly. He is in good spirits today. 9/15; patient presents for follow-up. He has no issues or complaints today. The wound is closed. Readmission 05/14/2022 [name] is an 81 year old male with a past medical history of uncontrolled type 2 diabetes on oral agents, diffuse large B-cell lymphoma and coronary artery disease status post CABG that presents the clinic for 3 wounds. He developed a  right knee wound when he fell out of his golf cart 3 weeks ago.  He developed increased warmth and erythema to the right leg. While he was still waiting in the ED on 04/24/2022 for this issue he dropped his phone and created a wound to his left thigh. He has been using Xeroform to these areas. He ultimately required admission from the ED for right lower extremity cellulitis and was Richard [name], Richard [name] ([id number]) [id number] Page 2 of 7 treated with Zyvox and Keflex. He also has a right plantar foot wound that started after having callus debridement by his podiatrist. He has had this wound for at least 6 weeks. For the foot wound he has not been keeping it covered and walks around barefoot. He has peripheral neuropathy However states that the wound site is painful. He denies any purulent drainage. He has swelling to this area. 11/21; patient presents for follow-up. He had an x-ray done at last clinic visit to the right foot that showed no focal bony erosion suggesting osteomyelitis. He completed his course of levofloxacin. He has been using his front offloading shoe. He has been using Xeroform to the knee wounds. He has no issues or complaints today. 11/28; patient presents for follow-up. His right knee wound has epithelialized. His right plantar foot wound remains closed. He has no issues or complaints today. Electronic Signature(s) Signed: 05/28/2022 4:03:55 PM By: Richard [name] Entered By: Richard [name] on 05/28/2022 15:53:44 -------------------------------------------------------------------------------- Physical Exam Details Patient Name: Date of Service: Richard [name] [name] 05/28/2022 2:45 PM Medical Record Number: [id number] Patient Account Number: [id number] Date of Birth/Sex: Treating RN: 04/18/1941 (81 y.o. M) Primary Care Provider: Roma [name] Other Clinician: Referring Provider: Treating Provider/Extender: Richard [name] in Treatment: 2 Constitutional respirations regular,  non-labored and within target range for patient.. Cardiovascular 2+ dorsalis pedis/posterior tibialis pulses. Psychiatric pleasant and cooperative. Notes Right lower extremity: T the right knee there is epithelization to the previous wound site. T the plantar foot at the fifth met head there is still epithelization with o o no open wound. Electronic Signature(s) Signed: 05/28/2022 4:03:55 PM By: Richard [name] Entered By: Richard [name] on 05/28/2022 15:55:50 -------------------------------------------------------------------------------- Physician Orders Details Patient Name: Date of Service: Richard [name], [name] 05/28/2022 2:45 PM Medical Record Number: [id number] Patient Account Number: [id number] Date of Birth/Sex: Treating RN: Nov 29, 1940 (81 y.o. [name] Primary Care Provider: Roma [name] Other Clinician: Referring Provider: Treating Provider/Extender: Richard [name] in Treatment: 2 Verbal / Phone Orders: No Diagnosis Coding Discharge From [facility] Discharge from [place] - Call if any future wound care needs. make sure to closely monitor feet daily. Purchase urea cream from [facility] on callouses. Edema Control - Lymphedema / SCD / Other Elevate legs to the level of the heart or above for 30 minutes daily and/or when sitting, a frequency of: - 3-4 times a day throughout the day. MADS, BORGMEYER ([id number]) [id number] Page 3 of 7 Avoid standing for long periods of time. Off-Loading Other: - No bare feet or just socks always wear shoes when walking. [facility] for wound care. Other Home Health Orders/Instructions: - [facility]. Electronic Signature(s) Signed: 05/28/2022 4:03:55 PM By: Richard [name] Entered By: Richard [name] on 05/28/2022 15:55:57 -------------------------------------------------------------------------------- Problem List  Details Patient Name: Date of Service: Richard [name], [name] 05/28/2022 2:45 PM Medical Record Number: [id number] Patient Account Number: [id number] Date of Birth/Sex: Treating RN: 12/20/1940 (81  y.o. M) Primary Care Provider: Roma [name] Other Clinician: Referring Provider: Treating Provider/Extender: Richard [name] in Treatment: 2 Active Problems ICD-10 Encounter Code Description Active Date MDM Diagnosis E11.621 Type 2 diabetes mellitus with foot ulcer 05/14/2022 No Yes L97.518 Non-pressure chronic ulcer of other part of right foot with other specified 05/14/2022 No Yes severity L97.822 Non-pressure chronic ulcer of other part of left lower leg with fat layer 05/14/2022 No Yes exposed L97.812 Non-pressure chronic ulcer of other part of right lower leg with fat layer 05/14/2022 No Yes exposed Inactive Problems Resolved Problems Electronic Signature(s) Signed: 05/28/2022 4:03:55 PM By: Richard [name] Entered By: Richard [name] on 05/28/2022 15:51:19 -------------------------------------------------------------------------------- Progress Note Details Patient Name: Date of Service: Richard [name], [place] L. 05/28/2022 2:45 PM Richard [name] ([id number]) [id number] Page 4 of 7 Medical Record Number: [id number] Patient Account Number: [id number] Date of Birth/Sex: Treating RN: June 09, 1941 (81 y.o. M) Primary Care Provider: Roma [name] Other Clinician: Referring Provider: Treating Provider/Extender: Richard [name] in Treatment: 2 Subjective Chief Complaint Information obtained from Patient 01/12/2021; Wounds to his left upper extremity and bilateral lower extremities. 05/14/2022; bilateral lower extremity wounds, right plantar foot wound History of Present Illness (HPI) Admission 7/15 [name] is an 81 year old male with a past medical history of type 2 diabetes on oral  agents, diffuse large B-cell lymphoma, and coronary artery disease status post CABG that presents to the clinic for wounds to his left upper extremity and bilateral lower extremities. He states that he went to urgent care to be evaluated for a fever. And he was instructed to go to the ED to be further evaluated for possible sepsis. Unfortunately he passed out in his car After his appointment and was not discovered for another 3 hours. He was eventually discovered and EMS had to take him out of his vehicle. This is when he developed abrasions to his left arm and legs. He has been using Xeroform to his wound beds every other day. He reports improvement to all the wounds except for the one on the back of his right leg. He denies pain or signs of infection. 7/22; patient presents for 1 week follow-up. He has no issues or complaints today. He has tolerated the compression wrap well on his right lower extremity. He has been able to [name] the dressing changes without issues to his left leg and left arm. His left arm wound has healed. He denies signs of infection. 7/29; patient presents for 1 week follow-up. He reports that his left leg wound is healed. He tolerated the compression wrap well on his right leg. He has no issues or complaints today. He denies signs of infection. 8/4; patient presents for 1 week follow-up. He continues to see improvement in wound healing to his right lower extremity. He has no issues or complaints today. He denies signs of infection. He does state that he went to the dermatologist and had some lesions removed on his face and left leg. He has been keeping these covered with a Band-Aid. 8/19; patient presents for follow-up. He is tolerated the compression wrap well. He has no issues or complaints today. He now only has 1 remaining wound. 9/1; patient presents for 1 week follow-up. He has tolerated the compression wrap well and has no issues or complaints today. He has been golfing 3  times weekly. He is in good spirits today. 9/15; patient presents for follow-up. He has no issues or complaints today. The wound is  closed. Readmission 05/14/2022 [name] is an 81 year old male with a past medical history of uncontrolled type 2 diabetes on oral agents, diffuse large B-cell lymphoma and coronary artery disease status post CABG that presents the clinic for 3 wounds. He developed a right knee wound when he fell out of his golf cart 3 weeks ago. He developed increased warmth and erythema to the right leg. While he was still waiting in the ED on 04/24/2022 for this issue he dropped his phone and created a wound to his left thigh. He has been using Xeroform to these areas. He ultimately required admission from the ED for right lower extremity cellulitis and was treated with Zyvox and Keflex. He also has a right plantar foot wound that started after having callus debridement by his podiatrist. He has had this wound for at least 6 weeks. For the foot wound he has not been keeping it covered and walks around barefoot. He has peripheral neuropathy However states that the wound site is painful. He denies any purulent drainage. He has swelling to this area. 11/21; patient presents for follow-up. He had an x-ray done at last clinic visit to the right foot that showed no focal bony erosion suggesting osteomyelitis. He completed his course of levofloxacin. He has been using his front offloading shoe. He has been using Xeroform to the knee wounds. He has no issues or complaints today. 11/28; patient presents for follow-up. His right knee wound has epithelialized. His right plantar foot wound remains closed. He has no issues or complaints today. Patient History Information obtained from Patient. Family History Cancer - Mother, Diabetes - Maternal Grandparents, Heart Disease - Paternal Grandparents, Hypertension - Paternal Grandparents, Seizures - Child, Stroke - Father, No family  history of Hereditary Spherocytosis, Kidney Disease, Lung Disease, Thyroid Problems, Tuberculosis. Social History Never smoker, Marital Status - Divorced, Alcohol Use - Never, Drug Use - No History, Caffeine Use - Rarely. Medical History Eyes Patient has history of Cataracts - Surgery Hematologic/Lymphatic Patient has history of Anemia Respiratory Patient has history of Sleep Apnea Cardiovascular Patient has history of Arrhythmia, Congestive Heart Failure, Coronary Artery Disease, Hypertension Endocrine Patient has history of Type II Diabetes Musculoskeletal Patient has history of Osteoarthritis Neurologic Patient has history of Neuropathy Oncologic Patient has history of Received Chemotherapy Denies history of Received Radiation Hospitalization/Surgery History - cellulitis right leg 10/25-10/28/2023. - balloon angioplasty 03/2021. - 12/29/2021 CHF. Medical A Surgical History Notes nd Musculoskeletal Richard [name], Richard [name] ([id number]) [id number] Page 5 of 7 Spinal Stenosis Oncologic 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 Objective Constitutional respirations regular, non-labored and within target range for patient.. Vitals Time Taken: 3:10 PM, Height: 72 in, Weight: 226 lbs, BMI: 30.6, Temperature: 98.3 F, Pulse: 82 bpm, Respiratory Rate: 20 breaths/min, Blood Pressure: 110/71 mmHg. Cardiovascular 2+ dorsalis pedis/posterior tibialis pulses. Psychiatric pleasant and cooperative. General Notes: Right lower extremity: T the right knee there is epithelization to the previous wound site. T the plantar foot at the fifth met head there is still o o epithelization with no open wound. Integumentary (Hair, Skin) Wound #5 status is Healed - Epithelialized. Original cause of wound was Skin Tear/Laceration. The date acquired was: 03/28/2022. The wound has been in treatment 2 weeks. The wound is located on the Right Knee. The wound measures 0cm length x 0cm  width x 0cm depth; 0cm^2 area and 0cm^3 volume. There is Fat Layer (Subcutaneous Tissue) exposed. There is a medium amount of serosanguineous drainage noted. There is large (67-100%)  red granulation within the wound bed. There is a small (1-33%) amount of necrotic tissue within the wound bed including Eschar and Adherent Slough. The periwound skin appearance exhibited: Dry/Scaly. Periwound temperature was noted as No Abnormality. Assessment Active Problems ICD-10 Type 2 diabetes mellitus with foot ulcer Non-pressure chronic ulcer of other part of right foot with other specified severity Non-pressure chronic ulcer of other part of left lower leg with fat layer exposed Non-pressure chronic ulcer of other part of right lower leg with fat layer exposed Patient's right knee wound has healed. He has done well with Xeroform. His right plantar foot wound remains closed. I recommended he inspect his feet daily. He can wear his regular tennis shoes at this time. This was a trauma wound. He may follow-up as needed. Plan Discharge From [facility]: Discharge from [facility] - Call if any future wound care needs. make sure to closely monitor feet daily. Purchase urea cream from [facility] on callouses. Edema Control - Lymphedema / SCD / Other: Elevate legs to the level of the heart or above for 30 minutes daily and/or when sitting, a frequency of: - 3-4 times a day throughout the day. Avoid standing for long periods of time. Off-Loading: Other: - No bare feet or just socks always wear shoes when walking. Home Health: [facility] for wound care. Other Home Health Orders/Instructions: - [facility]. 1. Discharge from clinic due to closed wound 2. Follow-up as needed Electronic Signature(s) Signed: 05/28/2022 4:03:55 PM By: Richard [name] Entered By: Richard [name] on 05/28/2022 15:57:31 Richard [name] ([id number]) [id number] Page 6 of  7 -------------------------------------------------------------------------------- HxROS Details Patient Name: Date of Service: Richard [name] [name] 05/28/2022 2:45 PM Medical Record Number: [id number] Patient Account Number: [id number] Date of Birth/Sex: Treating RN: April 30, 1941 (81 y.o. M) Primary Care Provider: Roma [name] Other Clinician: Referring Provider: Treating Provider/Extender: Richard [name] in Treatment: 2 Information Obtained From Patient Eyes Medical History: Positive for: Cataracts - Surgery Hematologic/Lymphatic Medical History: Positive for: Anemia Respiratory Medical History: Positive for: Sleep Apnea Cardiovascular Medical History: Positive for: Arrhythmia; Congestive Heart Failure; Coronary Artery Disease; Hypertension Endocrine Medical History: Positive for: Type II Diabetes Time with diabetes: 20 years Treated with: Oral agents Blood sugar tested every day: Yes Tested : Q am Musculoskeletal Medical History: Positive for: Osteoarthritis Past Medical History Notes: Spinal Stenosis Neurologic Medical History: Positive for: Neuropathy Oncologic Medical History: Positive for: Received Chemotherapy Negative for: Received Radiation Past Medical History Notes: 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 HBO Extended History Items Eyes: Cataracts Immunizations Pneumococcal Vaccine: Received Pneumococcal Vaccination: Yes Received Pneumococcal Vaccination On or After 60th BirthdayNGHIA, Richard [name] ([id number]) [id number] Page 7 of 7 Implantable Devices Yes Hospitalization / Surgery History Type of Hospitalization/Surgery cellulitis right leg 10/25-10/28/2023 balloon angioplasty 03/2021 12/29/2021 CHF Family and Social History Cancer: Yes - Mother; Diabetes: Yes - Maternal Grandparents; Heart Disease: Yes - Paternal Grandparents; Hereditary Spherocytosis: No; Hypertension: Yes  - Paternal Grandparents; Kidney Disease: No; Lung Disease: No; Seizures: Yes - Child; Stroke: Yes - Father; Thyroid Problems: No; Tuberculosis: No; Never smoker; Marital Status - Divorced; Alcohol Use: Never; Drug Use: No History; Caffeine Use: Rarely; Financial Concerns: No; Food, Clothing or Shelter Needs: No; Support System Lacking: No; Transportation Concerns: No Electronic Signature(s) Signed: 05/28/2022 4:03:55 PM By: Richard [name] Entered By: Richard [name] on 05/28/2022 15:53:53 -------------------------------------------------------------------------------- SuperBill Details Patient Name: Date of Service: Richard [name], [name] 05/28/2022 Medical Record  Number: [id number] Patient Account Number: [id number] Date of Birth/Sex: Treating RN: [birth year] (81 y.o. [name], Tammi Klippel Primary Care Provider: Roma [name] Other Clinician: Referring Provider: Treating Provider/Extender: Richard [name] in Treatment: 2 Diagnosis Coding ICD-10 Codes Code Description [phone] Type 2 diabetes mellitus with foot ulcer L97.518 Non-pressure chronic ulcer of other part of right foot with other specified severity L97.822 Non-pressure chronic ulcer of other part of left lower leg with fat layer exposed L97.812 Non-pressure chronic ulcer of other part of right lower leg with fat layer exposed Facility Procedures : CPT4 Code: [id number] Description: 99213 - WOUND CARE VISIT-LEV 3 EST PT Modifier: Quantity: 1 Physician Procedures : CPT4 Code Description Modifier [id number] - WC PHYS LEVEL 3 - EST PT ICD-10 Diagnosis Description E11.621 Type 2 diabetes mellitus with foot ulcer L97.518 Non-pressure chronic ulcer of other part of right foot with other specified severity L97.812  Non-pressure chronic ulcer of other part of right lower leg with fat layer exposed L97.822 Non-pressure chronic ulcer of other part of left lower leg with fat layer exposed Quantity:  1 Electronic Signature(s) Signed: 05/28/2022 4:03:55 PM By: Richard [name] Entered By: Richard [name] on 05/28/2022 15:57:48

## 2022-05-29 LAB — ALKALINE PHOSPHATASE, ISOENZYMES
Alkaline Phosphatase: 70 IU/L (ref 44–121)
BONE FRACTION: 27 % (ref 12–68)
INTESTINAL FRAC.: 6 % (ref 0–18)
LIVER FRACTION: 67 % (ref 13–88)

## 2022-05-29 LAB — SPECIMEN STATUS REPORT

## 2022-05-30 ENCOUNTER — Telehealth: Payer: Self-pay | Admitting: Family Medicine

## 2022-05-30 DIAGNOSIS — M1A09X0 Idiopathic chronic gout, multiple sites, without tophus (tophi): Secondary | ICD-10-CM | POA: Diagnosis not present

## 2022-05-30 DIAGNOSIS — G5793 Unspecified mononeuropathy of bilateral lower limbs: Secondary | ICD-10-CM | POA: Diagnosis not present

## 2022-05-30 DIAGNOSIS — Z6833 Body mass index (BMI) 33.0-33.9, adult: Secondary | ICD-10-CM | POA: Diagnosis not present

## 2022-05-30 DIAGNOSIS — M150 Primary generalized (osteo)arthritis: Secondary | ICD-10-CM | POA: Diagnosis not present

## 2022-05-30 DIAGNOSIS — E669 Obesity, unspecified: Secondary | ICD-10-CM | POA: Diagnosis not present

## 2022-05-30 NOTE — Telephone Encounter (Signed)
Patient said he missed a call from our office. He thought it could be from his labs. Please call to advise if needed.

## 2022-05-31 NOTE — Telephone Encounter (Signed)
I don't see any calls placed to patient

## 2022-06-03 ENCOUNTER — Ambulatory Visit (INDEPENDENT_AMBULATORY_CARE_PROVIDER_SITE_OTHER): Payer: Medicare Other

## 2022-06-03 ENCOUNTER — Ambulatory Visit (INDEPENDENT_AMBULATORY_CARE_PROVIDER_SITE_OTHER): Payer: Medicare Other | Admitting: Primary Care

## 2022-06-03 ENCOUNTER — Encounter: Payer: Self-pay | Admitting: Primary Care

## 2022-06-03 VITALS — BP 118/70 | HR 77 | Temp 97.5°F | Ht 72.0 in | Wt 236.6 lb

## 2022-06-03 DIAGNOSIS — R9389 Abnormal findings on diagnostic imaging of other specified body structures: Secondary | ICD-10-CM | POA: Diagnosis not present

## 2022-06-03 DIAGNOSIS — I517 Cardiomegaly: Secondary | ICD-10-CM | POA: Diagnosis not present

## 2022-06-03 DIAGNOSIS — J410 Simple chronic bronchitis: Secondary | ICD-10-CM

## 2022-06-03 DIAGNOSIS — G4733 Obstructive sleep apnea (adult) (pediatric): Secondary | ICD-10-CM | POA: Diagnosis not present

## 2022-06-03 DIAGNOSIS — I700 Atherosclerosis of aorta: Secondary | ICD-10-CM | POA: Diagnosis not present

## 2022-06-03 DIAGNOSIS — Z951 Presence of aortocoronary bypass graft: Secondary | ICD-10-CM | POA: Diagnosis not present

## 2022-06-03 DIAGNOSIS — J42 Unspecified chronic bronchitis: Secondary | ICD-10-CM | POA: Diagnosis not present

## 2022-06-03 DIAGNOSIS — I5042 Chronic combined systolic (congestive) and diastolic (congestive) heart failure: Secondary | ICD-10-CM

## 2022-06-03 NOTE — Addendum Note (Signed)
Addended byOralia Rud M on: 06/03/2022 09:46 AM   Modules accepted: Orders

## 2022-06-03 NOTE — Assessment & Plan Note (Addendum)
-   Stable; No evidence of volume overload. Maintained on Torsemide '40mg'$  daily.

## 2022-06-03 NOTE — Progress Notes (Addendum)
$'@Patient'e$  ID: Richard [name], male    DOB: 1941-06-18, 81 y.o.   MRN: [id number]  Chief Complaint  Patient presents with   Follow-up    Doing well.    Referring provider: [name], *  HPI: 81 year old male.  Past medical history significant for obstructive sleep apnea, congestive heart failure, diffuse large B-cell lymphoma, severe aortic stenosis status post TAVR and PPM, A-fib/flutter on chronic anticoagulation. Patient of Dr. Elsworth Soho.    06/03/2022 Patient presents today for follow-up. He was last seen in July.  Since last visit he was hospitalized for acute on chronic heart failure HF, he is now on Torsemide and feeling much better. He is able to walk without shortness of breath and do all ADLs. He was also admitted in October for right lower leg cellulitis requiring IV antibiotics. CXR in October showed probable interstitial lung disease and/or chronic bronchitic changes within lower lobes. He has no acute respiratory complains. No significant cough. No longer having hemoptysis.  He is using CPAP consistently, eligible for new machine- current one is working. He sleeps really well. He uses nasal mask. No issues with pressure settings. We do not have download from [place] and he does not have an SD card. Dme company is [employer].  [employer] download 03/04/22-06/01/22 Usage 90/90 days; 100% > 4 hours Average usage 8 hours 36 mins Pressure 15cm h20 Airleaks median 40.2L/min  AHI 1.9   Significant tests/ events reviewed   2008 PSG RDI 48/h ONO on CPAP /RA 08/2014 >> no desatn - dc O2  Allergies  Allergen Reactions   Ace Inhibitors Swelling and Other (See Comments)    Angioedema    Benazepril Swelling and Other (See Comments)    Angioedema; he is not a candidate for any angiotensin receptor blockers because of this significant allergic reaction. Because of a history of documented adverse serious drug reaction;Medi Alert bracelet  is recommended   Entresto  [Sacubitril-Valsartan] Swelling and Other (See Comments)    First-in-Class Angiotensin Receptor Neprilysin Inhibitor- Med was "red-flagged" by the patient's pharmacy for him to NOT take!   Hctz [Hydrochlorothiazide] Anaphylaxis and Swelling    Tongue and lip swelling    Aspirin Other (See Comments)    Gastritis, can aspirin not take 325 mg aspirin    Immunization History  Administered Date(s) Administered   Fluad Quad(high Dose 65+) 03/21/2021   Influenza Split 06/12/2011, 05/31/2013   Influenza Whole 05/01/2012   Influenza, High Dose Seasonal PF 02/09/2015, 03/12/2017, 03/09/2018, 03/02/2020   Influenza-Unspecified 02/11/2014, 02/29/2016, 02/25/2019, 04/01/2022   Moderna Sars-Covid-2 Vaccination 08/30/2019, 09/30/2019, 04/21/2020   Pneumococcal Conjugate-13 09/18/2015   Pneumococcal Polysaccharide-23 06/20/2013   Td 03/20/2010, 03/16/2013, 09/29/2017   Zoster Recombinat (Shingrix) 11/21/2016, 09/25/2017    Past Medical History:  Diagnosis Date   Anemia    Arthritis    hips   Axillary adenopathy 02/25/2017   Bradycardia    a. holter monitor has demonstrated HRs in 30s and [place]ach    CAD (coronary artery disease)    a. s/p CABG 2001  b.  07/28/2017 cath:   Severe three-vessel native CAD with total occlusion of LAD, ramus intermedius, first OM and RCA, patent RIMA to PDA, LIMA to LAD, sequential SVG to ramus intermedius and first OM.     Chronic lower back pain    Diffuse large B cell lymphoma (HCC)    Diverticulosis    Esophageal stricture    GERD (gastroesophageal reflux disease)    History of gout  HTN (hypertension)    Mixed hyperlipidemia    OSA on CPAP    with 2L O2 at night   Pancytopenia (HCC)    a. related to chemo therapy for B cell lymphoma   Peptic stricture of esophagus    Presence of permanent cardiac pacemaker    sees Dr. Valaria Good pacemaker   SCC (squamous cell carcinoma) 01/31/2021   R zygoma, EDC   SCC (squamous cell carcinoma) 01/31/2021    L post ankle, EDC   SCC (squamous cell carcinoma) 01/31/2021   L popliteal, EDC   Severe aortic stenosis    Spinal stenosis    Squamous cell carcinoma of skin 03/22/2009   Right mandible. SCCis, hypertrophic.    Squamous cell carcinoma of skin 12/25/2021   R lateral cheek, EDC   Squamous cell carcinoma of skin 12/25/2021   R postauricular neck, EDC   Squamous cell carcinoma of skin 12/25/2021   R forearm sup, EDC   Squamous cell carcinoma of skin 12/25/2021   R wrist, EDC   Type II diabetes mellitus ([place])    Wears dentures    partial upper    Tobacco History: Social History   Tobacco Use  Smoking Status Never  Smokeless Tobacco Never   Counseling given: Not Answered   Outpatient Medications Prior to Visit  Medication Sig Dispense Refill   acetaminophen (TYLENOL) 325 MG tablet Take 650 mg by mouth at bedtime as needed for moderate pain or headache.     allopurinol (ZYLOPRIM) 300 MG tablet Take 450 mg by mouth daily.     aluminum hydroxide-magnesium carbonate (GAVISCON) 95-358 MG/15ML SUSP Take 15 mLs by mouth as needed for indigestion or heartburn.     apixaban (ELIQUIS) 2.5 MG TABS tablet Take 1 tablet (2.5 mg total) by mouth 2 (two) times daily. 180 tablet 2   Ascorbic Acid (VITAMIN C PO) Take 1 tablet by mouth daily with breakfast.     atorvastatin (LIPITOR) 80 MG tablet TAKE 1 TABLET BY MOUTH EVERYDAY AT BEDTIME (Patient taking differently: Take 80 mg by mouth at bedtime.) 90 tablet 3   azelastine (ASTELIN) 0.1 % nasal spray PLACE 2 SPRAYS INTO BOTH NOSTRILS AT BEDTIME AS NEEDED FOR RHINITIS OR ALLERGIES. 30 mL 4   Coenzyme Q10 (CO Q-10 PO) Take 1 capsule by mouth daily.     esomeprazole (NEXIUM) 40 MG capsule Take 1 capsule (40 mg total) by mouth daily. (Patient taking differently: Take 40 mg by mouth daily before breakfast.) 30 capsule 5   ezetimibe (ZETIA) 10 MG tablet TAKE 1 TABLET BY MOUTH EVERY DAY (Patient taking differently: Take 10 mg by mouth daily.) 90 tablet 3    FARXIGA 10 MG TABS tablet Take 10 mg by mouth in the morning.     fenofibrate 160 MG tablet TAKE 1 TABLET BY MOUTH EVERY DAY 90 tablet 0   folic acid (FOLVITE) 1 MG tablet TAKE 2 TABLETS BY MOUTH EVERY DAY (Patient taking differently: Take 2 mg by mouth in the morning.) 180 tablet 4   FREESTYLE LITE test strip USE TO TEST BLOOD SUGAR ONCE A DAY. DX CODE: E11.9 100 strip 12   glimepiride (AMARYL) 2 MG tablet Take 1 tablet (2 mg total) by mouth in the morning and at bedtime. 180 tablet 0   icosapent Ethyl (VASCEPA) 1 g capsule TAKE 2 CAPSULES BY MOUTH TWICE A DAY (Patient taking differently: Take 2 g by mouth in the morning and at bedtime.) 360 capsule 1   Lancets (FREESTYLE) lancets  USE ONCE A DAY TO CHECK BLOOD SUGAR. 100 each 12   Menthol-Camphor (ICY HOT ADVANCED PAIN RELIEF EX) Apply 1 application  topically daily as needed (to painful sites).     metoprolol succinate (TOPROL-XL) 25 MG 24 hr tablet Take 1 tablet (25 mg total) by mouth daily. 90 tablet 3   nitroGLYCERIN (NITROSTAT) 0.4 MG SL tablet PLACE 1 TABLET (0.4 MG TOTAL) UNDER THE TONGUE EVERY 5 (FIVE) MINUTES AS NEEDED FOR CHEST PAIN. 25 tablet 6   potassium chloride SA (KLOR-CON M20) 20 MEQ tablet Take 2 tablets (40 mEq total) by mouth daily. 60 tablet 0   pregabalin (LYRICA) 150 MG capsule TAKE 1 CAPSULE BY MOUTH TWICE A DAY 180 capsule 1   psyllium (METAMUCIL) 58.6 % powder Take 1 packet by mouth daily as needed (for constipation- mix and drink).     senna (SENOKOT) 8.6 MG TABS tablet Take 2 tablets (17.2 mg total) by mouth at bedtime. This is for constipation.  Stop taking if you start having diarrhea. (Patient taking differently: Take 2 tablets by mouth daily as needed for mild constipation. This is for constipation.  Stop taking if you start having diarrhea.) 20 tablet 0   torsemide (DEMADEX) 20 MG tablet Take 2 tablets (40 mg total) by mouth in the morning. Do not take on 01/24/2022. 180 tablet 3   No facility-administered  medications prior to visit.    Review of Systems  Review of Systems  Constitutional: Negative.   HENT: Negative.    Respiratory:  Negative for cough, shortness of breath and wheezing.   Cardiovascular: Negative.    Physical Exam  BP 118/70 (BP Location: Left Arm, Patient Position: Sitting, Cuff Size: Large)   Pulse 77   Temp (!) 97.5 F (36.4 C) (Oral)   Ht 6' (1.829 m)   Wt 236 lb 9.6 oz (107.3 kg)   SpO2 96%   BMI 32.09 kg/m  Physical Exam Constitutional:      General: He is not in acute distress.    Appearance: Normal appearance. He is not ill-appearing.  HENT:     Head: Normocephalic and atraumatic.     Mouth/Throat:     Mouth: Mucous membranes are moist.     Pharynx: Oropharynx is clear.  Cardiovascular:     Rate and Rhythm: Normal rate and regular rhythm.     Comments: No significant LE swelling Pulmonary:     Effort: Pulmonary effort is normal.     Breath sounds: Normal breath sounds. No rhonchi or rales.     Comments: Isolated wheeze left lower lobe; O2 96% RA Musculoskeletal:        General: Normal range of motion.  Skin:    General: Skin is warm and dry.  Neurological:     General: No focal deficit present.     Mental Status: He is alert and oriented to person, place, and time. Mental status is at baseline.  Psychiatric:        Mood and Affect: Mood normal.        Behavior: Behavior normal.        Thought Content: Thought content normal.        Judgment: Judgment normal.      Lab Results:  CBC    Component Value Date/Time   WBC 8.1 05/22/2022 1259   WBC 9.2 04/30/2022 1153   RBC 4.60 05/22/2022 1300   RBC 4.63 05/22/2022 1259   HGB 13.6 05/22/2022 1259   HGB 11.5 (L) 09/18/2017 1559  HGB 10.8 (L) 06/27/2017 0857   HCT 42.7 05/22/2022 1259   HCT 36.7 (L) 09/18/2017 1559   HCT 34.0 (L) 06/27/2017 0857   PLT 174 05/22/2022 1259   PLT 69 (LL) 09/18/2017 1559   MCV 92.2 05/22/2022 1259   MCV 90 09/18/2017 1559   MCV 90 06/27/2017 0857    MCH 29.4 05/22/2022 1259   MCHC 31.9 05/22/2022 1259   RDW 17.5 (H) 05/22/2022 1259   RDW 18.4 (H) 09/18/2017 1559   RDW 17.9 (H) 06/27/2017 0857   LYMPHSABS 1.6 05/22/2022 1259   LYMPHSABS 0.5 (L) 06/27/2017 0857   MONOABS 0.5 05/22/2022 1259   EOSABS 0.2 05/22/2022 1259   EOSABS 0.0 06/27/2017 0857   BASOSABS 0.0 05/22/2022 1259   BASOSABS 0.1 06/27/2017 0857    BMET    Component Value Date/Time   NA 138 05/27/2022 0751   NA 138 02/08/2022 0811   NA 144 06/27/2017 0857   K 4.0 05/27/2022 0751   K 3.9 06/27/2017 0857   CL 96 05/27/2022 0751   CL 103 06/27/2017 0857   CO2 30 05/27/2022 0751   CO2 26 06/27/2017 0857   GLUCOSE 173 (H) 05/27/2022 0751   GLUCOSE 166 (H) 06/27/2017 0857   BUN 54 (H) 05/27/2022 0751   BUN 46 (H) 02/08/2022 0811   BUN 12 06/27/2017 0857   CREATININE 2.05 (H) 05/27/2022 0751   CREATININE 1.96 (H) 05/22/2022 1259   CREATININE 1.65 (H) 04/18/2020 0956   CALCIUM 9.3 05/27/2022 0751   CALCIUM 9.2 06/27/2017 0857   GFRNONAA 33 (L) 05/22/2022 1259   GFRAA 42 (L) 03/16/2020 0814    BNP    Component Value Date/Time   BNP 969.2 (H) 01/17/2022 0514    ProBNP    Component Value Date/Time   PROBNP 1,211.0 (H) 01/15/2022 1122    Imaging: DG Foot Complete Right  Result Date: 05/15/2022 CLINICAL DATA:  foot ulcer EXAM: RIGHT FOOT COMPLETE - 3+ VIEW COMPARISON:  None Available. FINDINGS: Early DJD at the first MTP joint. Small corticated ossicles the lateral margin of the fifth MTP joint. Regional soft tissue swelling. No focal bone erosion. No fracture or dislocation. Small calcaneal spur. No subcutaneous gas or foreign body. IMPRESSION: Soft tissue swelling without fracture or other acute finding. Electronically Signed   By: Richard Europe M.D.   On: 05/15/2022 19:46     Assessment & Plan:   OSA (obstructive sleep apnea) - Patient reports compliance with CPAP. No issues with mask fit or pressure settings - Requesting download from [facility] for  review - No changes today   Simple chronic bronchitis ([place]) - Remains largely asymptomatic. No significant cough or dyspnea. CXR in October showed probable interstitial lung disease and/or chronic bronchitic changes within lower lobes. Needs repeat chest xray today, consider CT chest.     Chronic combined systolic and diastolic heart failure ([place]) - Stable; No evidence of volume overload. Maintained on Torsemide '40mg'$  daily.    Martyn Ehrich, NP 06/03/2022

## 2022-06-03 NOTE — Patient Instructions (Signed)
Recommendations: Continue to wear CPAP every night for 4-6 hours or longer Continue diuretics as prescribed   Orders: Please provide patient with SD card Please request download from [facility] today re: chronic bronchitis   Follow-up: 6 months with Dr. Elsworth Soho ([place] location) or sooner if needed

## 2022-06-03 NOTE — Assessment & Plan Note (Signed)
-   Remains largely asymptomatic. No significant cough or dyspnea. CXR in October showed probable interstitial lung disease and/or chronic bronchitic changes within lower lobes. Needs repeat chest xray today, consider CT chest.

## 2022-06-03 NOTE — Assessment & Plan Note (Signed)
-   Patient reports compliance with CPAP. No issues with mask fit or pressure settings - Requesting download from [place] for review - No changes today

## 2022-06-04 NOTE — Progress Notes (Signed)
Please let patient know chest x-ray showed bronchial thickening consistent with chronic bronchitis.  No indication at this time for CT imaging of his chest since he is largely asymptomatic.  He should take Mucinex as needed for chest congestion. Follow up if developed cough, purulent mucus or increased shortness of breath.

## 2022-06-05 ENCOUNTER — Other Ambulatory Visit: Payer: Self-pay | Admitting: Family Medicine

## 2022-06-05 DIAGNOSIS — E1122 Type 2 diabetes mellitus with diabetic chronic kidney disease: Secondary | ICD-10-CM

## 2022-06-05 DIAGNOSIS — E781 Pure hyperglyceridemia: Secondary | ICD-10-CM

## 2022-06-07 ENCOUNTER — Telehealth: Payer: Self-pay | Admitting: Primary Care

## 2022-06-10 NOTE — Telephone Encounter (Signed)
Richard Ehrich, NP 06/04/2022  5:43 PM EST     Please let patient know chest x-ray showed bronchial thickening consistent with chronic bronchitis.  No indication at this time for CT imaging of his chest since he is largely asymptomatic.  He should take Mucinex as needed for chest congestion. Follow up if developed cough, purulent mucus or increased shortness of breath.    Called and spoke with pt letting him know the results of cxr and he verbalized understanding. Nothing further needed.

## 2022-06-10 NOTE — Telephone Encounter (Signed)
Patient is returning phone call. Patient phone number is [phone].

## 2022-06-10 NOTE — Telephone Encounter (Signed)
ATC LVMTCB x 1  

## 2022-06-11 ENCOUNTER — Ambulatory Visit: Payer: Medicare Other | Admitting: Podiatry

## 2022-06-18 ENCOUNTER — Telehealth: Payer: Self-pay | Admitting: Family Medicine

## 2022-06-18 NOTE — Telephone Encounter (Signed)
Patient received letter that his plan will no longer cover icosapent ethyl 1 gram capsules.  Per the letter, insurance will cover Vascepa - brand icosapent ethyl Checked his Part D 2024 formulary Lynda Rain[place] / [facility] [id number]) Dalene Seltzer will be covered at $47 / month. Patient will need to ask pharmacy to fill for brand Vascepa not generic starting in 2024.   Patient also reported that his blood glucose was still high. Usually 180 to 190's. However yesterday around 3pm he felt that his blood glucose was low. Checked and it was 152. He drank a soda with sugar and felt better.  Patient states he is taking glimepiride 2 tablets each morning and 2 tablets with evening meal. He checked to see which strength he has and he has '2mg'$  glimepiride tablets - so he is taking '4mg'$  each morning and '4mg'$  each evening.  He is worries about his blood glucose being too high since metformin was stopped when Scr increased.  Patient is also taking [place] '10mg'$  daily but as renal function starts to decrease you get less lowering of blood glucose with [place] and other SGLT2i's.   Could try GLP1 but he would likely need to apply for medication assistance program - Rybelsus and Ozempic are options D[facility] unfortunately doesn't currently have medication assistance program and [employer]'s medication assistance program is not currently enrolling patients).

## 2022-06-18 NOTE — Telephone Encounter (Signed)
Pt called to talk with Tammy on a medication that will not be covered by insurance next year. Tammy was unavailable at the time and advised pt a note would be sent back to give him a call later. Pt acknowledged understanding.

## 2022-06-20 ENCOUNTER — Telehealth: Payer: Self-pay | Admitting: Family Medicine

## 2022-06-20 ENCOUNTER — Other Ambulatory Visit: Payer: Self-pay | Admitting: Family Medicine

## 2022-06-20 MED ORDER — OZEMPIC (0.25 OR 0.5 MG/DOSE) 2 MG/3ML ~~LOC~~ SOPN: 0.5000 mg | PEN_INJECTOR | SUBCUTANEOUS | 1 refills | Status: DC

## 2022-06-20 NOTE — Telephone Encounter (Signed)
PA initiated via Covermymeds; KEY: [id number]. Awaiting determination.

## 2022-06-20 NOTE — Telephone Encounter (Signed)
Sent Rx for Ozempic - will start with 0.'25mg'$  weekly for 4 weeks, then increase to 0.'5mg'$  weekly.  Patient will come in for instruction on administration of Ozempic 07/03/2022

## 2022-06-20 NOTE — Addendum Note (Signed)
Addended by: [name] B on: 06/20/2022 03:56 PM   Modules accepted: Orders

## 2022-06-20 NOTE — Telephone Encounter (Signed)
Called patient to discuss possibly starting Ozempic. Patient states that he is in catastrophic coverage and would like to get Ozempic Rx before end of year. Patient would like Rx sent to his pharmacy.

## 2022-06-21 ENCOUNTER — Other Ambulatory Visit: Payer: Self-pay | Admitting: Family Medicine

## 2022-06-21 DIAGNOSIS — E1122 Type 2 diabetes mellitus with diabetic chronic kidney disease: Secondary | ICD-10-CM

## 2022-06-21 MED ORDER — TRULICITY 0.75 MG/0.5ML ~~LOC~~ SOAJ: 0.7500 mg | SUBCUTANEOUS | 4 refills | Status: DC

## 2022-06-21 NOTE — Telephone Encounter (Signed)
PA denied. Preferred alternatives: Trulicity or Bydureon

## 2022-06-25 ENCOUNTER — Inpatient Hospital Stay: Payer: Medicare Other

## 2022-06-25 ENCOUNTER — Ambulatory Visit: Payer: Medicare Other | Admitting: Family

## 2022-06-25 ENCOUNTER — Ambulatory Visit: Payer: Medicare Other

## 2022-06-26 ENCOUNTER — Other Ambulatory Visit: Payer: Self-pay

## 2022-06-26 ENCOUNTER — Inpatient Hospital Stay: Payer: Medicare Other | Attending: Hematology & Oncology

## 2022-06-26 ENCOUNTER — Inpatient Hospital Stay: Payer: Medicare Other

## 2022-06-26 ENCOUNTER — Inpatient Hospital Stay (HOSPITAL_BASED_OUTPATIENT_CLINIC_OR_DEPARTMENT_OTHER): Payer: Medicare Other | Admitting: Family

## 2022-06-26 ENCOUNTER — Encounter: Payer: Self-pay | Admitting: Family

## 2022-06-26 VITALS — BP 116/56 | HR 76 | Temp 98.0°F | Resp 19 | Ht 72.0 in | Wt 239.1 lb

## 2022-06-26 DIAGNOSIS — D519 Vitamin B12 deficiency anemia, unspecified: Secondary | ICD-10-CM

## 2022-06-26 DIAGNOSIS — D510 Vitamin B12 deficiency anemia due to intrinsic factor deficiency: Secondary | ICD-10-CM | POA: Insufficient documentation

## 2022-06-26 DIAGNOSIS — C8332 Diffuse large B-cell lymphoma, intrathoracic lymph nodes: Secondary | ICD-10-CM | POA: Insufficient documentation

## 2022-06-26 DIAGNOSIS — D509 Iron deficiency anemia, unspecified: Secondary | ICD-10-CM

## 2022-06-26 DIAGNOSIS — G629 Polyneuropathy, unspecified: Secondary | ICD-10-CM | POA: Insufficient documentation

## 2022-06-26 DIAGNOSIS — D500 Iron deficiency anemia secondary to blood loss (chronic): Secondary | ICD-10-CM

## 2022-06-26 DIAGNOSIS — Z79899 Other long term (current) drug therapy: Secondary | ICD-10-CM | POA: Diagnosis not present

## 2022-06-26 LAB — CBC WITH DIFFERENTIAL (CANCER CENTER ONLY)
Abs Immature Granulocytes: 0.15 10*3/uL — ABNORMAL HIGH (ref 0.00–0.07)
Basophils Absolute: 0 10*3/uL (ref 0.0–0.1)
Basophils Relative: 0 %
Eosinophils Absolute: 0.1 10*3/uL (ref 0.0–0.5)
Eosinophils Relative: 2 %
HCT: 43.2 % (ref 39.0–52.0)
Hemoglobin: 13.8 g/dL (ref 13.0–17.0)
Immature Granulocytes: 2 %
Lymphocytes Relative: 17 %
Lymphs Abs: 1.5 10*3/uL (ref 0.7–4.0)
MCH: 29.5 pg (ref 26.0–34.0)
MCHC: 31.9 g/dL (ref 30.0–36.0)
MCV: 92.3 fL (ref 80.0–100.0)
Monocytes Absolute: 0.6 10*3/uL (ref 0.1–1.0)
Monocytes Relative: 6 %
Neutro Abs: 6.6 10*3/uL (ref 1.7–7.7)
Neutrophils Relative %: 73 %
Platelet Count: 193 10*3/uL (ref 150–400)
RBC: 4.68 MIL/uL (ref 4.22–5.81)
RDW: 16.3 % — ABNORMAL HIGH (ref 11.5–15.5)
WBC Count: 9 10*3/uL (ref 4.0–10.5)
nRBC: 0 % (ref 0.0–0.2)

## 2022-06-26 LAB — CMP (CANCER CENTER ONLY)
ALT: 21 U/L (ref 0–44)
AST: 20 U/L (ref 15–41)
Albumin: 4.6 g/dL (ref 3.5–5.0)
Alkaline Phosphatase: 61 U/L (ref 38–126)
Anion gap: 11 (ref 5–15)
BUN: 47 mg/dL — ABNORMAL HIGH (ref 8–23)
CO2: 30 mmol/L (ref 22–32)
Calcium: 9.9 mg/dL (ref 8.9–10.3)
Chloride: 99 mmol/L (ref 98–111)
Creatinine: 1.89 mg/dL — ABNORMAL HIGH (ref 0.61–1.24)
GFR, Estimated: 35 mL/min — ABNORMAL LOW (ref >60)
Glucose, Bld: 293 mg/dL — ABNORMAL HIGH (ref 70–99)
Potassium: 3.8 mmol/L (ref 3.5–5.1)
Sodium: 140 mmol/L (ref 135–145)
Total Bilirubin: 0.6 mg/dL (ref 0.3–1.2)
Total Protein: 7 g/dL (ref 6.5–8.1)

## 2022-06-26 LAB — VITAMIN B12: Vitamin B-12: 264 pg/mL (ref 180–914)

## 2022-06-26 LAB — RETICULOCYTES
Immature Retic Fract: 21.9 % — ABNORMAL HIGH (ref 2.3–15.9)
RBC.: 4.71 MIL/uL (ref 4.22–5.81)
Retic Count, Absolute: 82.9 10*3/uL (ref 19.0–186.0)
Retic Ct Pct: 1.8 % (ref 0.4–3.1)

## 2022-06-26 LAB — FERRITIN: Ferritin: 1309 ng/mL — ABNORMAL HIGH (ref 24–336)

## 2022-06-26 LAB — LACTATE DEHYDROGENASE: LDH: 140 U/L (ref 98–192)

## 2022-06-26 MED ORDER — CYANOCOBALAMIN 1000 MCG/ML IJ SOLN
1000.0000 ug | Freq: Once | INTRAMUSCULAR | Status: AC
  Administered 2022-06-26: 1000 ug via INTRAMUSCULAR
  Filled 2022-06-26: qty 1

## 2022-06-26 NOTE — Progress Notes (Signed)
Hematology and Oncology Follow Up Visit  Richard [name] [id number] 1941/06/01 81 y.o. 06/26/2022   Principle Diagnosis:  Diffuse large cell non-Hodgkin's lymphoma (IPI = 3) - NOT "double hit" Pernicious anemia Iron deficiency secondary to bleeding   Past Therapy: R-CHOP - s/p cycle 8 - completed 08/2017   Current Therapy:        Vitamin B12 1 mg IM every month Xgeva 120 mg subcu q 3 months - next dose due in 07/2022 IV iron as indicated   Interim History:  Richard [name] is here today for follow-up and B12 injection. He is doing well and has no complaints at this time.  He is golfing on days that the weather is nice.  No blood loss, bruising or petechiae.  No fever, chills, n/v, cough, rash, dizziness, SOB, chest pain, palpitations, abdominal pain or changes in bowel or bladder habits.  No swelling or tenderness in his extremities.  Neuropathy in his feet unchanged from baseline.  No falls or syncope reported.  Appetite and hydration are good. Weight is stable at 239 lbs.   ECOG Performance Status: 1 - Symptomatic but completely ambulatory  Medications:  Allergies as of 06/26/2022       Reactions   Ace Inhibitors Swelling, Other (See Comments)   Angioedema   Benazepril Swelling, Other (See Comments)   Angioedema; he is not a candidate for any angiotensin receptor blockers because of this significant allergic reaction. Because of a history of documented adverse serious drug reaction;Medi Alert bracelet  is recommended   Entresto [sacubitril-valsartan] Swelling, Other (See Comments)   First-in-Class Angiotensin Receptor Neprilysin Inhibitor- Med was "red-flagged" by the patient's pharmacy for him to NOT take!   Hctz [hydrochlorothiazide] Anaphylaxis, Swelling   Tongue and lip swelling   Aspirin Other (See Comments)   Gastritis, can aspirin not take 325 mg aspirin        Medication List        Accurate as of June 26, 2022  1:29 PM. If you have any questions, ask  your nurse or doctor.          acetaminophen 325 MG tablet Commonly known as: TYLENOL Take 650 mg by mouth at bedtime as needed for moderate pain or headache.   allopurinol 300 MG tablet Commonly known as: ZYLOPRIM Take 450 mg by mouth daily.   aluminum hydroxide-magnesium carbonate 95-358 MG/15ML Susp Commonly known as: GAVISCON Take 15 mLs by mouth as needed for indigestion or heartburn.   apixaban 2.5 MG Tabs tablet Commonly known as: ELIQUIS Take 1 tablet (2.5 mg total) by mouth 2 (two) times daily.   atorvastatin 80 MG tablet Commonly known as: LIPITOR TAKE 1 TABLET BY MOUTH EVERYDAY AT BEDTIME What changed: See the new instructions.   azelastine 0.1 % nasal spray Commonly known as: ASTELIN PLACE 2 SPRAYS INTO BOTH NOSTRILS AT BEDTIME AS NEEDED FOR RHINITIS OR ALLERGIES.   CO Q-10 PO Take 1 capsule by mouth daily.   esomeprazole 40 MG capsule Commonly known as: NexIUM Take 1 capsule (40 mg total) by mouth daily. What changed: when to take this   ezetimibe 10 MG tablet Commonly known as: ZETIA TAKE 1 TABLET BY MOUTH EVERY DAY   Farxiga 10 MG Tabs tablet Generic drug: dapagliflozin propanediol Take 10 mg by mouth in the morning.   fenofibrate 160 MG tablet Take 1 tablet (160 mg total) by mouth daily.   folic acid 1 MG tablet Commonly known as: FOLVITE TAKE 2 TABLETS BY MOUTH EVERY DAY  What changed: when to take this   freestyle lancets USE ONCE A DAY TO CHECK BLOOD SUGAR.   FREESTYLE LITE test strip Generic drug: glucose blood USE TO TEST BLOOD SUGAR ONCE A DAY. DX CODE: E11.9   glimepiride 2 MG tablet Commonly known as: AMARYL Take 1 tablet (2 mg total) by mouth in the morning and at bedtime. What changed: how much to take   icosapent Ethyl 1 g capsule Commonly known as: VASCEPA Take 2 capsules (2 g total) by mouth 2 (two) times daily.   ICY HOT ADVANCED PAIN RELIEF EX Apply 1 application  topically daily as needed (to painful sites).    metoprolol succinate 25 MG 24 hr tablet Commonly known as: TOPROL-XL Take 1 tablet (25 mg total) by mouth daily.   nitroGLYCERIN 0.4 MG SL tablet Commonly known as: NITROSTAT PLACE 1 TABLET (0.4 MG TOTAL) UNDER THE TONGUE EVERY 5 (FIVE) MINUTES AS NEEDED FOR CHEST PAIN.   potassium chloride SA 20 MEQ tablet Commonly known as: Klor-Con M20 Take 2 tablets (40 mEq total) by mouth daily.   pregabalin 150 MG capsule Commonly known as: LYRICA TAKE 1 CAPSULE BY MOUTH TWICE A DAY   psyllium 58.6 % powder Commonly known as: METAMUCIL Take 1 packet by mouth daily as needed (for constipation- mix and drink).   senna 8.6 MG Tabs tablet Commonly known as: SENOKOT Take 2 tablets (17.2 mg total) by mouth at bedtime. This is for constipation.  Stop taking if you start having diarrhea. What changed:  when to take this reasons to take this   torsemide 20 MG tablet Commonly known as: DEMADEX Take 2 tablets (40 mg total) by mouth in the morning. Do not take on 01/24/2022.   Trulicity 3.54 SF/6.8LE Sopn Generic drug: Dulaglutide Inject 0.75 mg into the skin once a week.   VITAMIN C PO Take 1 tablet by mouth daily with breakfast.        Allergies:  Allergies  Allergen Reactions   Ace Inhibitors Swelling and Other (See Comments)    Angioedema    Benazepril Swelling and Other (See Comments)    Angioedema; he is not a candidate for any angiotensin receptor blockers because of this significant allergic reaction. Because of a history of documented adverse serious drug reaction;Medi Alert bracelet  is recommended   Entresto [Sacubitril-Valsartan] Swelling and Other (See Comments)    First-in-Class Angiotensin Receptor Neprilysin Inhibitor- Med was "red-flagged" by the patient's pharmacy for him to NOT take!   Hctz [Hydrochlorothiazide] Anaphylaxis and Swelling    Tongue and lip swelling    Aspirin Other (See Comments)    Gastritis, can aspirin not take 325 mg aspirin    Past Medical  History, Surgical history, Social history, and Family History were reviewed and updated.  Review of Systems: All other 10 point review of systems is negative.   Physical Exam:  vitals were not taken for this visit.   Wt Readings from Last 3 Encounters:  06/03/22 236 lb 9.6 oz (107.3 kg)  05/22/22 233 lb 12.8 oz (106.1 kg)  05/20/22 234 lb 9.6 oz (106.4 kg)    Ocular: Sclerae unicteric, pupils equal, round and reactive to light Ear-nose-throat: Oropharynx clear, dentition fair Lymphatic: No cervical or supraclavicular adenopathy Lungs no rales or rhonchi, good excursion bilaterally Heart regular rate and rhythm, no murmur appreciated Abd soft, nontender, positive bowel sounds MSK no focal spinal tenderness, no joint edema Neuro: non-focal, well-oriented, appropriate affect Breasts: Deferred   Lab Results  Component  Value Date   WBC 9.0 06/26/2022   HGB 13.8 06/26/2022   HCT 43.2 06/26/2022   MCV 92.3 06/26/2022   PLT 193 06/26/2022   Lab Results  Component Value Date   FERRITIN 1,468 (H) 05/22/2022   IRON 110 05/22/2022   TIBC 396 05/22/2022   UIBC 286 05/22/2022   IRONPCTSAT 28 05/22/2022   Lab Results  Component Value Date   RETICCTPCT 1.8 06/26/2022   RBC 4.68 06/26/2022   RBC 4.71 06/26/2022   RETICCTABS 52.1 11/22/2011   No results found for: "KPAFRELGTCHN", "LAMBDASER", "[facility]" Lab Results  Component Value Date   IGA 159 03/09/2012   Lab Results  Component Value Date   ALBUMINELP 4.2 07/27/2018   MSPIKE Not Observed 07/27/2018     Chemistry      Component Value Date/Time   NA 138 05/27/2022 0751   NA 138 02/08/2022 0811   NA 144 06/27/2017 0857   K 4.0 05/27/2022 0751   K 3.9 06/27/2017 0857   CL 96 05/27/2022 0751   CL 103 06/27/2017 0857   CO2 30 05/27/2022 0751   CO2 26 06/27/2017 0857   BUN 54 (H) 05/27/2022 0751   BUN 46 (H) 02/08/2022 0811   BUN 12 06/27/2017 0857   CREATININE 2.05 (H) 05/27/2022 0751   CREATININE 1.96 (H)  05/22/2022 1259   CREATININE 1.65 (H) 04/18/2020 0956      Component Value Date/Time   CALCIUM 9.3 05/27/2022 0751   CALCIUM 9.2 06/27/2017 0857   ALKPHOS 60 05/27/2022 0751   ALKPHOS 128 (H) 06/27/2017 0857   AST 22 05/27/2022 0751   AST 21 05/22/2022 1259   ALT 19 05/27/2022 0751   ALT 16 05/22/2022 1259   ALT 24 06/27/2017 0857   BILITOT 0.7 05/27/2022 0751   BILITOT 0.7 05/22/2022 1259       Impression and Plan: Mr. Hogston is a very pleasant 81 yo caucasian gentleman with diffuse large B-cell lymphoma (not "double hit" lymphoma). He completed treatment in February 2019.  Iron studies pending.  B 12 given.  Follow-up in 1 month.    Lottie Dawson, NP 12/27/20231:29 PM

## 2022-06-26 NOTE — Patient Instructions (Signed)
Vitamin B12 Injection What is this medication? Vitamin B12 (VAHY tuh min B12) prevents and treats low vitamin B12 levels in your body. It is used in people who do not get enough vitamin B12 from their diet or when their digestive tract does not absorb enough. Vitamin B12 plays an important role in maintaining the health of your nervous system and red blood cells. This medicine may be used for other purposes; ask your health care provider or pharmacist if you have questions. COMMON BRAND NAME(S): B-12 Compliance Kit, B-12 Injection Kit, Cyomin, Dodex, LA-12, Nutri-Twelve, Physicians EZ Use B-12, Primabalt What should I tell my care team before I take this medication? They need to know if you have any of these conditions: Kidney disease Leber's disease Megaloblastic anemia An unusual or allergic reaction to cyanocobalamin, cobalt, other medications, foods, dyes, or preservatives Pregnant or trying to get pregnant Breast-feeding How should I use this medication? This medication is injected into a muscle or deeply under the skin. It is usually given in a clinic or care team's office. However, your care team may teach you how to inject yourself. Follow all instructions. Talk to your care team about the use of this medication in children. Special care may be needed. Overdosage: If you think you have taken too much of this medicine contact a poison control center or emergency room at once. NOTE: This medicine is only for you. Do not share this medicine with others. What if I miss a dose? If you are given your dose at a clinic or care team's office, call to reschedule your appointment. If you give your own injections, and you miss a dose, take it as soon as you can. If it is almost time for your next dose, take only that dose. Do not take double or extra doses. What may interact with this medication? Alcohol Colchicine This list may not describe all possible interactions. Give your health care  provider a list of all the medicines, herbs, non-prescription drugs, or dietary supplements you use. Also tell them if you smoke, drink alcohol, or use illegal drugs. Some items may interact with your medicine. What should I watch for while using this medication? Visit your care team regularly. You may need blood work done while you are taking this medication. You may need to follow a special diet. Talk to your care team. Limit your alcohol intake and avoid smoking to get the best benefit. What side effects may I notice from receiving this medication? Side effects that you should report to your care team as soon as possible: Allergic reactions--skin rash, itching, hives, swelling of the face, lips, tongue, or throat Swelling of the ankles, hands, or feet Trouble breathing Side effects that usually do not require medical attention (report to your care team if they continue or are bothersome): Diarrhea This list may not describe all possible side effects. Call your doctor for medical advice about side effects. You may report side effects to FDA at 1-800-FDA-1088. Where should I keep my medication? Keep out of the reach of children. Store at room temperature between 15 and 30 degrees C (59 and 85 degrees F). Protect from light. Throw away any unused medication after the expiration date. NOTE: This sheet is a summary. It may not cover all possible information. If you have questions about this medicine, talk to your doctor, pharmacist, or health care provider.  2023 Elsevier/Gold Standard (2007-08-08 00:00:00)  

## 2022-06-27 ENCOUNTER — Other Ambulatory Visit: Payer: Self-pay | Admitting: Hematology & Oncology

## 2022-06-27 LAB — IRON AND IRON BINDING CAPACITY (CC-WL,HP ONLY)
Iron: 91 ug/dL (ref 45–182)
Saturation Ratios: 24 % (ref 17.9–39.5)
TIBC: 379 ug/dL (ref 250–450)
UIBC: 288 ug/dL (ref 117–376)

## 2022-07-03 ENCOUNTER — Ambulatory Visit (INDEPENDENT_AMBULATORY_CARE_PROVIDER_SITE_OTHER): Payer: Medicare Other | Admitting: Pharmacist

## 2022-07-03 DIAGNOSIS — N183 Chronic kidney disease, stage 3 unspecified: Secondary | ICD-10-CM | POA: Diagnosis not present

## 2022-07-03 DIAGNOSIS — I129 Hypertensive chronic kidney disease with stage 1 through stage 4 chronic kidney disease, or unspecified chronic kidney disease: Secondary | ICD-10-CM | POA: Diagnosis not present

## 2022-07-03 DIAGNOSIS — E1169 Type 2 diabetes mellitus with other specified complication: Secondary | ICD-10-CM | POA: Diagnosis not present

## 2022-07-03 DIAGNOSIS — E1122 Type 2 diabetes mellitus with diabetic chronic kidney disease: Secondary | ICD-10-CM

## 2022-07-03 DIAGNOSIS — E785 Hyperlipidemia, unspecified: Secondary | ICD-10-CM | POA: Diagnosis not present

## 2022-07-03 NOTE — Progress Notes (Addendum)
Pharmacy Note  07/03/2022 Name: Richard [name] MRN: [id number] DOB: 08/08/1940  Subjective: Richard [name] is a 82 y.o. year old male who is a primary care patient of Carollee Herter, Alferd Apa, DO. Clinical Pharmacist Practitioner referral was placed to assist with medication and diabetes management.    Engaged with patient face to face for follow up visit today.  Type 2 DM - home blood glucose has been elevated ranging from 150 to 215 since metformin was stopped. Discontinued due to rising Scr.  Patient is currently taking [place] '10mg'$  daily (for DM and CKD) and glimepiride '2mg'$  - 2 tablets = '4mg'$  daily.  Our plan was to start Ozempic and I was planning to provide instruction on administration but patients insurance preferred Trulicity.  Patient brings in Trulicity 0.'75mg'$  today.   Patient also reports cost of medications is high when he reaches Medicare coverage gap. He is taking several high cost meds - Eliquis, Wilder Glade, Vascepa, Trulicity. We have screened for medication assistance programs before but patient did not qualify.   Objective: Review of patient status, including review of consultants reports, laboratory and other test data, was performed as part of comprehensive.  Lab Results  Component Value Date   CREATININE 1.89 (H) 06/26/2022   CREATININE 2.05 (H) 05/27/2022   CREATININE 1.96 (H) 05/22/2022    Lab Results  Component Value Date   HGBA1C 8.1 (H) 04/25/2022       Component Value Date/Time   CHOL 122 04/30/2022 1153   CHOL 162 11/23/2020 0828   TRIG 225.0 (H) 04/30/2022 1153   HDL 22.70 (L) 04/30/2022 1153   HDL 38 (L) 11/23/2020 0828   CHOLHDL 5 04/30/2022 1153   VLDL 45.0 (H) 04/30/2022 1153   LDLCALC 76 01/25/2022 1218   LDLCALC 89 11/23/2020 0828   LDLCALC  04/18/2020 0956     Comment:     . LDL cholesterol not calculated. Triglyceride levels greater than 400 mg/dL invalidate calculated LDL results. . Reference range: <100 . Desirable range <100 mg/dL  for primary prevention;   <70 mg/dL for patients with CHD or diabetic patients  with > or = 2 CHD risk factors. [name] LDL-C is now calculated using the Martin-Hopkins  calculation, which is a validated novel method providing  better accuracy than the Friedewald equation in the  estimation of LDL-C.  [name] et al. [name]. 5697;948(01): 2061-2068  (http://education.[employer].com/faq/FAQ164)    LDLDIRECT 70.0 04/30/2022 1153     Clinical ASCVD: Yes  The ASCVD Risk score (Arnett DK, et al., 2019) failed to calculate for the following reasons:   The 2019 ASCVD risk score is only valid for ages 39 to 17   The patient has a prior MI or stroke diagnosis    BP Readings from Last 3 Encounters:  06/26/22 (!) 116/56  06/03/22 118/70  05/20/22 124/66     Allergies  Allergen Reactions   Ace Inhibitors Swelling and Other (See Comments)    Angioedema    Benazepril Swelling and Other (See Comments)    Angioedema; he is not a candidate for any angiotensin receptor blockers because of this significant allergic reaction. Because of a history of documented adverse serious drug reaction;Medi Alert bracelet  is recommended   Entresto [Sacubitril-Valsartan] Swelling and Other (See Comments)    First-in-Class Angiotensin Receptor Neprilysin Inhibitor- Med was "red-flagged" by the patient's pharmacy for him to NOT take!   Hctz [Hydrochlorothiazide] Anaphylaxis and Swelling    Tongue and lip swelling  Aspirin Other (See Comments)    Gastritis, can aspirin not take 325 mg aspirin    Medications Reviewed Today     Reviewed by [name], RPH-CPP (Pharmacist) on 07/03/22 at 1445  Med List Status: <None>   Medication Order Taking? Sig Documenting Provider Last Dose Status Informant  acetaminophen (TYLENOL) 325 MG tablet [id number] Yes Take 650 mg by mouth at bedtime as needed for moderate pain or headache. [provider] Taking Active Self  allopurinol (ZYLOPRIM) 300 MG tablet  [id number] Yes Take 450 mg by mouth daily. [provider] Taking Active Self           Med Note [name], [name]   Wed Jan 16, 2022  2:57 PM) 1.5 tablets = 450 mg  aluminum hydroxide-magnesium carbonate (GAVISCON) 95-358 MG/15ML SUSP [id number] Yes Take 15 mLs by mouth as needed for indigestion or heartburn. [provider] Taking Active Self  apixaban (ELIQUIS) 2.5 MG TABS tablet [id number] Yes Take 1 tablet (2.5 mg total) by mouth 2 (two) times daily. Minus Breeding, MD Taking Active Self  Ascorbic Acid (VITAMIN C PO) [id number] Yes Take 1 tablet by mouth daily with breakfast. [provider] Taking Active Self  atorvastatin (LIPITOR) 80 MG tablet [id number] Yes TAKE 1 TABLET BY MOUTH EVERYDAY AT BEDTIME  Patient taking differently: Take 80 mg by mouth at bedtime.   Minus Breeding, MD Taking Active Self  azelastine (ASTELIN) 0.1 % nasal spray [id number] Yes PLACE 2 SPRAYS INTO BOTH NOSTRILS AT BEDTIME AS NEEDED FOR RHINITIS OR ALLERGIES. [name], DO Taking Active Self  Coenzyme Q10 (CO Q-10 PO) [id number] Yes Take 1 capsule by mouth daily. [provider] Taking Active Self  Dulaglutide (TRULICITY) 1.19 ER/7.4YC SOPN [id number] No Inject 0.75 mg into the skin once a week.  Patient not taking: Reported on 07/03/2022   [name], DO Not Taking Active   esomeprazole (NEXIUM) 40 MG capsule [id number] Yes Take 1 capsule (40 mg total) by mouth daily.  Patient taking differently: Take 40 mg by mouth daily before breakfast.   [name], MD Taking Active Self  ezetimibe (ZETIA) 10 MG tablet [id number] Yes TAKE 1 TABLET BY MOUTH EVERY DAY  Patient taking differently: Take 10 mg by mouth daily.   Minus Breeding, MD Taking Active Self  FARXIGA 10 MG TABS tablet [id number] Yes Take 10 mg by mouth in the morning. [provider] Taking Active Self  fenofibrate 160 MG tablet [id number] Yes Take 1 tablet (160 mg total) by mouth daily. Roma Schanz R, DO Taking Active   folic acid (FOLVITE) 1 MG tablet [id number] Yes TAKE 2 TABLETS BY MOUTH EVERY DAY [name], MD Taking Active   FREESTYLE LITE test strip [id number] Yes USE TO TEST BLOOD SUGAR ONCE A DAY. DX CODE: E11.9 [name], DO Taking Active Self  glimepiride (AMARYL) 2 MG tablet [id number] Yes Take 1 tablet (2 mg total) by mouth in the morning and at bedtime.  Patient taking differently: Take 4 mg by mouth in the morning and at bedtime.   [name], DO Taking Active   icosapent Ethyl (VASCEPA) 1 g capsule [id number] Yes Take 2 capsules (2 g total) by mouth 2 (two) times daily. Carollee Herter, Alferd Apa, DO Taking Active   Lancets (FREESTYLE) lancets [id number] Yes USE ONCE A DAY TO CHECK BLOOD SUGAR. [name], DO Taking Active Self  Menthol-Camphor (ICY HOT ADVANCED PAIN RELIEF EX)  [id number] Yes Apply 1 application  topically daily as needed (to painful sites). [provider] Taking Active Self  metoprolol succinate (TOPROL-XL) 25 MG 24 hr tablet [id number] Yes Take 1 tablet (25 mg total) by mouth daily. Minus Breeding, MD Taking Active Self  nitroGLYCERIN (NITROSTAT) 0.4 MG SL tablet [id number] Yes PLACE 1 TABLET (0.4 MG TOTAL) UNDER THE TONGUE EVERY 5 (FIVE) MINUTES AS NEEDED FOR CHEST PAIN. Minus Breeding, MD Taking Active Self  potassium chloride SA (KLOR-CON M20) 20 MEQ tablet [id number] Yes Take 2 tablets (40 mEq total) by mouth daily. Modena Jansky, MD Taking Active Self  pregabalin (LYRICA) 150 MG capsule [id number] Yes TAKE 1 CAPSULE BY MOUTH TWICE A DAY Carollee Herter, Yvonne R, DO Taking Active Self  psyllium (METAMUCIL) 58.6 % powder [id number] Yes Take 1 packet by mouth daily as needed (for constipation- mix and drink). [provider] Taking Active Self  senna (SENOKOT) 8.6 MG TABS tablet [id number] Yes Take 2 tablets (17.2 mg total) by mouth at bedtime. This is for constipation.  Stop taking if you start having  diarrhea.  Patient taking differently: Take 2 tablets by mouth daily as needed for mild constipation. This is for constipation.  Stop taking if you start having diarrhea.   Modena Jansky, MD Taking Active Self  torsemide (DEMADEX) 20 MG tablet [id number] Yes Take 2 tablets (40 mg total) by mouth in the morning. Do not take on 01/24/2022. Minus Breeding, MD Taking Active Self  Med List Note Henderson Newcomer, CPhT 06/10/13 4174): cpap machine 2 L of oxygen at night            Patient Active Problem List   Diagnosis Date Noted   Simple chronic bronchitis ([place]) 06/03/2022   Cellulitis of lower extremity 04/30/2022   Type 2 DM with CKD stage 3 and hypertension ([place]) 04/30/2022   Cellulitis of right leg 04/24/2022   Stage 3b chronic kidney disease (CKD) ([place]) 04/24/2022   Lactic acidosis 04/24/2022   Left upper quadrant pain 04/08/2022   Subacute cough 04/08/2022   Chronic combined systolic and diastolic heart failure ([place]) 02/06/2022   Acute exacerbation of CHF (congestive heart failure) ([place]) 01/16/2022   Hemoptysis 12/26/2021   Subclavian vein stenosis 12/23/2021   Strep throat 11/16/2021   Multiple superficial wounds with infection 01/04/2021   Cardiac pacemaker in situ 11/20/2020   S/P CABG x 4 11/20/2020   Ascending aortic aneurysm ([place]) 11/20/2020   Senile purpura ([place]) 07/27/2020   Porokeratosis 06/20/2020   Pain due to onychomycosis of toenails of both feet 12/14/2019   Educated about COVID-19 virus infection 11/25/2019   DM2 (diabetes mellitus, type 2) ([place]) 08/10/2019   Hyperlipidemia associated with type 2 diabetes mellitus ([place]) 08/10/2019   S/P left TKA 06/29/2019   Peripheral neuropathy 07/27/2018   Uncontrolled type 2 diabetes mellitus with hyperglycemia ([place]) 05/13/2018   Diabetic peripheral neuropathy associated with type 2 diabetes mellitus ([place]) 05/13/2018   Pansinusitis 05/13/2018   Severe aortic stenosis    Complete heart block (HCC)    Paroxysmal atrial  fibrillation (HCC)    Acute on chronic diastolic CHF (congestive heart failure), NYHA class 2 (HCC)    Left arm swelling    Status post transcatheter aortic valve replacement (TAVR) using bioprosthesis 09/09/2017   Demand ischemia of myocardium    Typical atrial flutter (HCC)    Acute on chronic diastolic heart failure ([place])    GIB (gastrointestinal bleeding) 08/16/2017   Hyperlipidemia 08/05/2017   NSTEMI (non-ST elevated  myocardial infarction) ([place])    Aortic stenosis, severe 07/25/2017   Pancytopenia ([place]) 07/24/2017   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 ([place]) 02/27/2017   Bradycardia 01/04/2017   Coronary artery disease 01/04/2017   Stenosis of carotid artery 01/04/2017   Obesity 09/18/2016   Wenckebach block    Disorder associated with type II diabetes mellitus ([place]) 05/21/2013   Spinal stenosis of lumbar region 04/08/2013   Anemia, iron deficiency 11/29/2011   B12 deficiency anemia 07/30/2011   OSA (obstructive sleep apnea) 07/06/2010   CAD, ARTERY BYPASS GRAFT 08/22/2009   Essential hypertension 03/29/2009   Bilateral lower extremity edema 02/21/2009   Hyperlipidemia LDL goal <70 11/26/2008   GOUT 08/17/2006     Medication Assistance:   High med cost when in coverage gap. Screened for medication assistance program in past - patient did not qualify. Screened for [employer] for hypercholesterolemia. Patient did qualify. Approved for $2500 thru 06/03/2023   Assessment / Plan: Type 2 DM - not at goal Start Trulicity 0.'75mg'$  weekly for 4 weeks. Will reassess around 07/23/2022. If able will plan to increase to 1.'5mg'$  weekly.  Provided education about Trulicity administration, injection site prep, injection site selection and disposal of Trulicity pen.  Discuss proper storage of Trulicity in refrigerator but that pens could stay at room temperature for 14 days.   Medication management:  Reviewed and updated medication list Reviewed refill history  and adherence Applied for hypercholesterolemia [employer] - approved; Explained to patient guidelines of program (can use for atorvastatin, Vascepa, ezetimibe and fenofibrate).   Follow Up:  Telephone follow up appointment with care management team member scheduled for:  07/23/2022   [name], PharmD Clinical Pharmacist [facility] [phone]

## 2022-07-09 ENCOUNTER — Ambulatory Visit: Payer: Medicare Other | Admitting: Dermatology

## 2022-07-13 ENCOUNTER — Other Ambulatory Visit: Payer: Self-pay | Admitting: Family Medicine

## 2022-07-13 DIAGNOSIS — E1151 Type 2 diabetes mellitus with diabetic peripheral angiopathy without gangrene: Secondary | ICD-10-CM

## 2022-07-15 ENCOUNTER — Other Ambulatory Visit: Payer: Self-pay | Admitting: Family Medicine

## 2022-07-15 ENCOUNTER — Telehealth: Payer: Self-pay

## 2022-07-15 DIAGNOSIS — N183 Chronic kidney disease, stage 3 unspecified: Secondary | ICD-10-CM

## 2022-07-15 NOTE — Telephone Encounter (Signed)
Patient called he missed his appt for PDT last week, he has one scheduled for tomorrow, discussed with patient keep tomorrows appt,

## 2022-07-16 ENCOUNTER — Ambulatory Visit (INDEPENDENT_AMBULATORY_CARE_PROVIDER_SITE_OTHER): Payer: Medicare Other

## 2022-07-16 DIAGNOSIS — L570 Actinic keratosis: Secondary | ICD-10-CM | POA: Diagnosis not present

## 2022-07-16 MED ORDER — AMINOLEVULINIC ACID HCL 20 % EX SOLR
1.0000 | Freq: Once | CUTANEOUS | Status: AC
  Administered 2022-07-16: 354 mg via TOPICAL

## 2022-07-16 NOTE — Progress Notes (Signed)
Patient completed PDT therapy today.  1. AK (actinic keratosis) Head - Anterior (Face)  Photodynamic therapy - Head - Anterior (Face) Procedure discussed: discussed risks, benefits, side effects. and alternatives   Prep: site scrubbed/prepped with acetone   Location:  Face Number of lesions:  Multiple Type of treatment:  Blue light Aminolevulinic Acid (see MAR for details): Levulan Number of Levulan sticks used:  1 Incubation time (minutes):  60 Number of minutes under lamp:  16 Number of seconds under lamp:  40 Cooling:  Floor fan and fan Outcome: patient tolerated procedure well with no complications   Post-procedure details: sunscreen applied    Johnsie Kindred, RMA

## 2022-07-16 NOTE — Patient Instructions (Signed)
Levulan/PDT Treatment Common Side Effects  - Burning/stinging, which may be severe and last up to 24-72 hours after your treatment  - Redness, swelling and/or peeling which may last up to 4 weeks  - Scaling/crusting which may last up to 2 weeks  - Sun sensitivity (you MUST avoid sun exposure for 48-72 hours after treatment)  Care Instructions  - Okay to wash with soap and water and shampoo as normal  - If needed, you can do a cold compress (ex. Ice packs) for comfort  - If okay with your Primary Doctor, you may use analgesics such as Tylenol every 4-6 hours, not to exceed recommended dose  - You may apply Cerave Healing Ointment, Vaseline or Aquaphor  - If you have a lot of swelling you may take a Benadryl to help with this (this may cause drowsiness)  Sun Precautions  - Wear a wide brim hat for the next week if outside  - Wear a sunblock with zinc or titanium dioxide at least SPF 50 daily   We will recheck you in 10-12 weeks. If any problems, please call the office and ask to speak with a nurse. 

## 2022-07-22 ENCOUNTER — Encounter: Payer: Self-pay | Admitting: Family Medicine

## 2022-07-22 ENCOUNTER — Ambulatory Visit (INDEPENDENT_AMBULATORY_CARE_PROVIDER_SITE_OTHER): Payer: Medicare Other | Admitting: Family Medicine

## 2022-07-22 VITALS — BP 122/80 | HR 67 | Temp 98.4°F | Resp 18 | Ht 72.0 in | Wt 239.8 lb

## 2022-07-22 DIAGNOSIS — J029 Acute pharyngitis, unspecified: Secondary | ICD-10-CM | POA: Diagnosis not present

## 2022-07-22 DIAGNOSIS — J020 Streptococcal pharyngitis: Secondary | ICD-10-CM | POA: Diagnosis not present

## 2022-07-22 LAB — POC COVID19 BINAXNOW: SARS Coronavirus 2 Ag: NEGATIVE

## 2022-07-22 LAB — POC INFLUENZA A&B (BINAX/QUICKVUE)
Influenza A, POC: NEGATIVE
Influenza B, POC: NEGATIVE

## 2022-07-22 LAB — POCT RAPID STREP A (OFFICE): Rapid Strep A Screen: POSITIVE — AB

## 2022-07-22 MED ORDER — AMOXICILLIN 875 MG PO TABS: 875.0000 mg | ORAL_TABLET | Freq: Two times a day (BID) | ORAL | 0 refills | Status: AC

## 2022-07-22 NOTE — Progress Notes (Signed)
Subjective:   By signing my name below, I, Richard [name], attest that this documentation has been prepared under the direction and in the presence of [place] DO 07/22/2022   Patient ID: Richard [name], male    DOB: 08-Aug-1940, 82 y.o.   MRN: [id number]  Chief Complaint  Patient presents with   Cough    Sxs started in 01/19, pt states having congestion, fever, sore throat, and cough. Neg COVID yesterday    HPI Patient is in today for a sick visit. He complains of fever, sore throat, body aches, yellow mucus production beginning on 07/19/2022. Yellow mucus come out his nose while using a Neti pot. He has been taking Tylenol every 8 hours to relieve symptoms.   He reported that since a prior hospitalization he has lost about 30 lbs of fluid and has been compliant with his Trulicity.   Today, he tested positive for strep throat. He was negative for flu and Covid.  He denies having any new joint pain, new moles, sinus pain, chest pain, palpitations, cough, SOB, wheezing, n/v/d, constipation, blood in stool, dysuria, frequency, hematuria, or headaches at this time.  Past Medical History:  Diagnosis Date   Anemia    Arthritis    hips   Axillary adenopathy 02/25/2017   Bradycardia    a. holter monitor has demonstrated HRs in 30s and [place]ink[place]ach    CAD (coronary artery disease)    a. s/p CABG 2001  b.  07/28/2017 cath:   Severe three-vessel native CAD with total occlusion of LAD, ramus intermedius, first OM and RCA, patent RIMA to PDA, LIMA to LAD, sequential SVG to ramus intermedius and first OM.     Chronic lower back pain    Diffuse large B cell lymphoma (HCC)    Diverticulosis    Esophageal stricture    GERD (gastroesophageal reflux disease)    History of gout    HTN (hypertension)    Mixed hyperlipidemia    OSA on CPAP    with 2L O2 at night   Pancytopenia ([place])    a. related to chemo therapy for B cell lymphoma   Peptic stricture of esophagus    Presence of  permanent cardiac pacemaker    sees Dr. Valaria Good pacemaker   SCC (squamous cell carcinoma) 01/31/2021   R zygoma, EDC   SCC (squamous cell carcinoma) 01/31/2021   L post ankle, EDC   SCC (squamous cell carcinoma) 01/31/2021   L popliteal, EDC   Severe aortic stenosis    Spinal stenosis    Squamous cell carcinoma of skin 03/22/2009   Right mandible. SCCis, hypertrophic.    Squamous cell carcinoma of skin 12/25/2021   R lateral cheek, EDC   Squamous cell carcinoma of skin 12/25/2021   R postauricular neck, EDC   Squamous cell carcinoma of skin 12/25/2021   R forearm sup, EDC   Squamous cell carcinoma of skin 12/25/2021   R wrist, EDC   Type II diabetes mellitus ([place])    Wears dentures    partial upper    Past Surgical History:  Procedure Laterality Date   APPENDECTOMY  ~ [place] Left 11/06/2016   Procedure: CATARACT EXTRACTION PHACO AND INTRAOCULAR LENS PLACEMENT ([place]);  Surgeon: Estill Cotta, MD;  Location: [facility];  Service: Ophthalmology;  Laterality: Left;  Lot # [id number] [place]: 01:09.4 AP%:25.2 CDE: 30.64   CATARACT EXTRACTION W/PHACO Right 12/04/2016   Procedure:  CATARACT EXTRACTION PHACO AND INTRAOCULAR LENS PLACEMENT ([facility]);  Surgeon: Estill Cotta, MD;  Location: [facility];  Service: Ophthalmology;  Laterality: Right;  [place] 1:25.9 AP% 24.1 CDE 39.10 Fluid Pack lot # [id number]   COLONOSCOPY W/ BIOPSIES AND POLYPECTOMY  07/02/2011   CORONARY ANGIOPLASTY  07/02/1991   CORONARY ANGIOPLASTY WITH STENT PLACEMENT  05/31/1997   "1"   CORONARY ARTERY BYPASS GRAFT  03/01/2000   "CABG X5"   ECTROPION REPAIR Right 09/01/2018   Procedure: REPAIR OF ECTROPION BILATERAL upper and lower;  Surgeon: Karle Starch, MD;  Location: [place];  Service: Ophthalmology;  Laterality: Right;  Diabetic - oral meds sleep apnea   ESOPHAGEAL DILATION  X 3-4   Dr. Lyla Son; "last one was in the 1990's"    ESOPHAGOGASTRODUODENOSCOPY     multiple   FLEXIBLE SIGMOIDOSCOPY     multiple   HYDRADENITIS EXCISION Left 02/25/2017   Procedure: EXCISION DEEP LEFT AXILLARY LYMPH NODE;  Surgeon: Fanny Skates, MD;  Location: [place];  Service: General;  Laterality: Left;   INTRAOPERATIVE TRANSTHORACIC ECHOCARDIOGRAM N/A 09/09/2017   Procedure: INTRAOPERATIVE TRANSTHORACIC ECHOCARDIOGRAM;  Surgeon: Burnell Blanks, MD;  Location: [place];  Service: Open Heart Surgery;  Laterality: N/A;   KNEE ARTHROSCOPY Left 07/01/2009   meniscus repair   LEFT HEART CATHETERIZATION WITH CORONARY/GRAFT ANGIOGRAM N/A 03/16/2014   Procedure: LEFT HEART CATHETERIZATION WITH Beatrix Fetters;  Surgeon: Burnell Blanks, MD;  Location: [facility] CATH LAB;  Service: Cardiovascular;  Laterality: N/A;   LUMBAR LAMINECTOMY/DECOMPRESSION MICRODISCECTOMY Right 06/17/2013   Procedure: LUMBAR LAMINECTOMY MICRODISCECTOMY L4-L5 RIGHT EXCISION OF SYNOVIAL CYST RIGHT   (1 LEVEL) RIGHT PARTIAL FACETECTOMY;  Surgeon: Tobi Bastos, MD;  Location: [facility];  Service: Orthopedics;  Laterality: Right;   MYELOGRAM  04/06/2013   lumbar, Dr Gladstone Lighter   ORBITAL LESION EXCISION Right 09/01/2018   Procedure: ORBITOTOMY WITHOUT BONE FLAP WITH REMOVAL OF LESION RIGHT;  Surgeon: Karle Starch, MD;  Location: [place];  Service: Ophthalmology;  Laterality: Right;   PACEMAKER IMPLANT N/A 09/10/2017   Procedure: PACEMAKER IMPLANT;  Surgeon: Deboraha Sprang, MD;  Location: [place] CV LAB;  Service: Cardiovascular;  Laterality: N/A;   PANENDOSCOPY     PERIPHERAL VASCULAR BALLOON ANGIOPLASTY Left 03/16/2021   Procedure: PERIPHERAL VASCULAR BALLOON ANGIOPLASTY;  Surgeon: [name], MD;  Location: [place] CV [facility];  Service: Cardiovascular;  Laterality: Left;  subclavian  vein   PORTACATH PLACEMENT N/A 03/06/2017   Procedure: INSERTION PORT-A-CATH AND ASPIRATE SEROMA LEFT AXILLA;  Surgeon: Fanny Skates, MD;  Location: [place];  Service: General;  Laterality: N/A;   RIGHT/LEFT HEART CATH AND CORONARY/GRAFT ANGIOGRAPHY N/A 07/28/2017   Procedure: RIGHT/LEFT HEART CATH AND CORONARY/GRAFT ANGIOGRAPHY;  Surgeon: Sherren Mocha, MD;  Location: [place] CV LAB;  Service: Cardiovascular;  Laterality: N/A;   SHOULDER SURGERY Right 08/30/2010   screws placed; "tendons tore off"   SKIN CANCER EXCISION Right    "neck"   TONSILLECTOMY  ~ [place] Left 06/29/2019   Procedure: TOTAL KNEE ARTHROPLASTY;  Surgeon: Paralee Cancel, MD;  [facility];  Service: Orthopedics;  Laterality: Left;  70 mins   TRANSCATHETER AORTIC VALVE REPLACEMENT, TRANSFEMORAL N/A 09/09/2017   Procedure: TRANSCATHETER AORTIC VALVE REPLACEMENT, TRANSFEMORAL;  Surgeon: Burnell Blanks, MD;  Location: [place];  Service: Open Heart Surgery;  Laterality: N/A;   UPPER EXTREMITY VENOGRAPHY Left 02/15/2021   Procedure: CENTRAL VENO;  Surgeon: [name], MD;  Location:  [place] INVASIVE CV LAB;  Service: Cardiovascular;  Laterality: Left;   UPPER EXTREMITY VENOGRAPHY N/A 03/16/2021   Procedure: UPPER EXTREMITY VENOGRAPHY;  Surgeon: [name], MD;  Location: [place] CV [facility];  Service: Cardiovascular;  Laterality: N/A;   UPPER GI ENDOSCOPY  07/02/2011   Gastritis; Dr Carlean Purl   VASECTOMY     wireless pacemaker placed      Family History  Problem Relation Age of Onset   Stroke Father    Hypertension Father    Pancreatic cancer Mother    Diabetes Maternal Grandmother    Stroke Maternal Grandmother    Heart attack Paternal Grandmother    Colon cancer Neg Hx    Esophageal cancer Neg Hx    Rectal cancer Neg Hx    Stomach cancer Neg Hx    Ulcers Neg Hx     Social History   Socioeconomic History   Marital status: Divorced    Spouse name: Not on file   Number of children: 2   Years of education: college   Highest education level: Not on file  Occupational History    Employer: RETIRED  Tobacco Use    Smoking status: Never   Smokeless tobacco: Never  Vaping Use   Vaping Use: Never used  Substance and Sexual Activity   Alcohol use: No    Alcohol/week: 0.0 standard drinks of alcohol    Comment: "last drink was in 2012"( 03/15/2014)   Drug use: No   Sexual activity: Not Currently  Other Topics Concern   Not on file  Social History Narrative   ** Merged History Encounter **       Divorced, lives with a roommate. 1 son one daughter 3-4 caffeinated beverages daily Right-handed. 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   Social Determinants of Health   Financial Resource Strain: Low Risk  (02/11/2022)   Overall Financial Resource Strain (CARDIA)    Difficulty of Paying Living Expenses: Not very hard  Food Insecurity: No Food Insecurity (04/25/2022)   Hunger Vital Sign    Worried About Running Out of Food in the Last Year: Never true    Ran Out of Food in the Last Year: Never true  Transportation Needs: No Transportation Needs (04/25/2022)   PRAPARE - Hydrologist (Medical): No    Lack of Transportation (Non-Medical): No  Physical Activity: Insufficiently Active (02/11/2022)   Exercise Vital Sign    Days of Exercise per Week: 5 days    Minutes of Exercise per Session: 10 min  Stress: Not on file  Social Connections: Moderately Isolated (09/06/2021)   Social Connection and Isolation Panel [NHANES]    Frequency of Communication with Friends and Family: More than three times a week    Frequency of Social Gatherings with Friends and Family: More than three times a week    Attends Religious Services: More than 4 times per year    Active Member of [employer] or Organizations: No    Attends Archivist Meetings: Never    Marital Status: Divorced  Human resources officer Violence: Not At Risk (04/25/2022)   Humiliation, Afraid, Rape, and  Kick questionnaire    Fear of Current or Ex-Partner: No    Emotionally Abused: No    Physically Abused: No    Sexually Abused: No    Outpatient Medications Prior to Visit  Medication Sig Dispense Refill   acetaminophen (TYLENOL) 325 MG tablet Take 650 mg by mouth at bedtime as needed for moderate pain or headache.     allopurinol (ZYLOPRIM) 300 MG tablet Take 450 mg by mouth daily.     aluminum hydroxide-magnesium carbonate (GAVISCON) 95-358 MG/15ML SUSP Take 15 mLs by mouth as needed for indigestion or heartburn.     apixaban (ELIQUIS) 2.5 MG TABS tablet Take 1 tablet (2.5 mg total) by mouth 2 (two) times daily. 180 tablet 2   Ascorbic Acid (VITAMIN C PO) Take 1 tablet by mouth daily with breakfast.     atorvastatin (LIPITOR) 80 MG tablet TAKE 1 TABLET BY MOUTH EVERYDAY AT BEDTIME (Patient taking differently: Take 80 mg by mouth at bedtime.) 90 tablet 3   azelastine (ASTELIN) 0.1 % nasal spray PLACE 2 SPRAYS INTO BOTH NOSTRILS AT BEDTIME AS NEEDED FOR RHINITIS OR ALLERGIES. 30 mL 4   Coenzyme Q10 (CO Q-10 PO) Take 1 capsule by mouth daily.     esomeprazole (NEXIUM) 40 MG capsule Take 1 capsule (40 mg total) by mouth daily. (Patient taking differently: Take 40 mg by mouth daily before breakfast.) 30 capsule 5   ezetimibe (ZETIA) 10 MG tablet TAKE 1 TABLET BY MOUTH EVERY DAY (Patient taking differently: Take 10 mg by mouth daily.) 90 tablet 3   FARXIGA 10 MG TABS tablet Take 10 mg by mouth in the morning.     fenofibrate 160 MG tablet Take 1 tablet (160 mg total) by mouth daily. 90 tablet 0   folic acid (FOLVITE) 1 MG tablet TAKE 2 TABLETS BY MOUTH EVERY DAY 180 tablet 4   glimepiride (AMARYL) 2 MG tablet Take 1 tablet (2 mg total) by mouth in the morning and at bedtime. (Patient taking differently: Take 4 mg by mouth in the morning and at bedtime.) 180 tablet 0   glucose blood (FREESTYLE LITE) test strip USE TO TEST BLOOD SUGAR ONCE A DAY. DX CODE: E11.9 100 strip 12   icosapent Ethyl  (VASCEPA) 1 g capsule Take 2 capsules (2 g total) by mouth 2 (two) times daily. 360 capsule 0   Lancets (FREESTYLE) lancets USE ONCE A DAY TO CHECK BLOOD SUGAR. 100 each 12   Menthol-Camphor (ICY HOT ADVANCED PAIN RELIEF EX) Apply 1 application  topically daily as needed (to painful sites).     metoprolol succinate (TOPROL-XL) 25 MG 24 hr tablet Take 1 tablet (25 mg total) by mouth daily. 90 tablet 3   nitroGLYCERIN (NITROSTAT) 0.4 MG SL tablet PLACE 1 TABLET (0.4 MG TOTAL) UNDER THE TONGUE EVERY 5 (FIVE) MINUTES AS NEEDED FOR CHEST PAIN. 25 tablet 6   potassium chloride SA (KLOR-CON M20) 20 MEQ tablet Take 2 tablets (40 mEq total) by mouth daily. 60 tablet 0   pregabalin (LYRICA) 150 MG capsule TAKE 1 CAPSULE BY MOUTH TWICE A DAY 180 capsule 1   psyllium (METAMUCIL) 58.6 % powder Take 1 packet by mouth daily as needed (for constipation- mix and drink).     senna (SENOKOT) 8.6 MG TABS tablet Take 2 tablets (17.2 mg total) by mouth at bedtime. This is for constipation.  Stop taking if you start having diarrhea. (Patient taking differently: Take 2 tablets by mouth daily as needed for mild constipation. This is for constipation.  Stop taking if you start having diarrhea.) 20 tablet 0   torsemide (DEMADEX) 20 MG tablet Take 2 tablets (40 mg total) by mouth in the morning. Do not take on 01/24/2022. 180 tablet  3   Dulaglutide (TRULICITY) 9.60 AV/4.0JW SOPN Inject 0.75 mg into the skin once a week. (Patient not taking: Reported on 07/03/2022) 0.5 mL 4   No facility-administered medications prior to visit.    Allergies  Allergen Reactions   Ace Inhibitors Swelling and Other (See Comments)    Angioedema    Benazepril Swelling and Other (See Comments)    Angioedema; he is not a candidate for any angiotensin receptor blockers because of this significant allergic reaction. Because of a history of documented adverse serious drug reaction;Medi Alert bracelet  is recommended   Entresto [Sacubitril-Valsartan]  Swelling and Other (See Comments)    First-in-Class Angiotensin Receptor Neprilysin Inhibitor- Med was "red-flagged" by the patient's pharmacy for him to NOT take!   Hctz [Hydrochlorothiazide] Anaphylaxis and Swelling    Tongue and lip swelling    Aspirin Other (See Comments)    Gastritis, can aspirin not take 325 mg aspirin    Review of Systems  Constitutional:  Positive for chills and fever.  HENT:  Positive for congestion and sore throat. Negative for sinus pain.   Respiratory:  Positive for cough and sputum production. Negative for shortness of breath and wheezing.   Cardiovascular:  Negative for chest pain and leg swelling.  Gastrointestinal:  Negative for nausea and vomiting.       Objective:    Physical Exam Vitals and nursing note reviewed.  Constitutional:      General: He is not in acute distress.    Appearance: Normal appearance.  HENT:     Head: Normocephalic and atraumatic.     Right Ear: Tympanic membrane, ear canal and external ear normal.     Left Ear: Tympanic membrane, ear canal and external ear normal.     Mouth/Throat:     Pharynx: Posterior oropharyngeal erythema present.     Comments: Post nasal drip Eyes:     Extraocular Movements: Extraocular movements intact.     Pupils: Pupils are equal, round, and reactive to light.  Neck:     Comments:   Cardiovascular:     Rate and Rhythm: Normal rate and regular rhythm.     Heart sounds: Normal heart sounds. No murmur heard.    No gallop.  Pulmonary:     Effort: Pulmonary effort is normal. No respiratory distress.     Breath sounds: Normal breath sounds. No wheezing or rales.  Abdominal:     Palpations: Abdomen is soft.     Tenderness: There is no abdominal tenderness.  Lymphadenopathy:     Cervical: Cervical adenopathy present.  Skin:    General: Skin is warm and dry.  Neurological:     General: No focal deficit present.     Mental Status: He is alert and oriented to person, place, and time.      Gait: Gait normal.  Psychiatric:        Behavior: Behavior normal.        Judgment: Judgment normal.     BP 122/80 (BP Location: Left Arm, Patient Position: Sitting, Cuff Size: Normal)   Pulse 67   Temp 98.4 F (36.9 C) (Oral)   Resp 18   Ht 6' (1.829 m)   Wt 239 lb 12.8 oz (108.8 kg)   SpO2 96%   BMI 32.52 kg/m  Wt Readings from Last 3 Encounters:  07/22/22 239 lb 12.8 oz (108.8 kg)  06/26/22 239 lb 1.3 oz (108.4 kg)  06/03/22 236 lb 9.6 oz (107.3 kg)    Diabetic Foot Exam -  Simple   No data filed    Lab Results  Component Value Date   WBC 9.0 06/26/2022   HGB 13.8 06/26/2022   HCT 43.2 06/26/2022   PLT 193 06/26/2022   GLUCOSE 293 (H) 06/26/2022   CHOL 122 04/30/2022   TRIG 225.0 (H) 04/30/2022   HDL 22.70 (L) 04/30/2022   LDLDIRECT 70.0 04/30/2022   LDLCALC 76 01/25/2022   ALT 21 06/26/2022   AST 20 06/26/2022   NA 140 06/26/2022   K 3.8 06/26/2022   CL 99 06/26/2022   CREATININE 1.89 (H) 06/26/2022   BUN 47 (H) 06/26/2022   CO2 30 06/26/2022   TSH 2.450 07/27/2018   PSA 1.03 07/26/2021   INR 1.15 11/15/2017   HGBA1C 8.1 (H) 04/25/2022   MICROALBUR <0.7 07/26/2021    Lab Results  Component Value Date   TSH 2.450 07/27/2018   Lab Results  Component Value Date   WBC 9.0 06/26/2022   HGB 13.8 06/26/2022   HCT 43.2 06/26/2022   MCV 92.3 06/26/2022   PLT 193 06/26/2022   Lab Results  Component Value Date   NA 140 06/26/2022   K 3.8 06/26/2022   CO2 30 06/26/2022   GLUCOSE 293 (H) 06/26/2022   BUN 47 (H) 06/26/2022   CREATININE 1.89 (H) 06/26/2022   BILITOT 0.6 06/26/2022   ALKPHOS 61 06/26/2022   AST 20 06/26/2022   ALT 21 06/26/2022   PROT 7.0 06/26/2022   ALBUMIN 4.6 06/26/2022   CALCIUM 9.9 06/26/2022   ANIONGAP 11 06/26/2022   EGFR 35 (L) 02/08/2022   GFR 29.34 (L) 05/27/2022   Lab Results  Component Value Date   CHOL 122 04/30/2022   Lab Results  Component Value Date   HDL 22.70 (L) 04/30/2022   Lab Results  Component  Value Date   LDLCALC 76 01/25/2022   Lab Results  Component Value Date   TRIG 225.0 (H) 04/30/2022   Lab Results  Component Value Date   CHOLHDL 5 04/30/2022   Lab Results  Component Value Date   HGBA1C 8.1 (H) 04/25/2022       Assessment & Plan:   Problem List Items Addressed This Visit       Unprioritized   Strep throat - Primary   Relevant Medications   amoxicillin (AMOXIL) 875 MG tablet   Other Visit Diagnoses     Sore throat       Relevant Orders   POCT rapid strep A (Completed)   POC COVID-19 (Completed)   POC Influenza A&B (Binax test) (Completed)      Meds ordered this encounter  Medications   amoxicillin (AMOXIL) 875 MG tablet    Sig: Take 1 tablet (875 mg total) by mouth 2 (two) times daily for 10 days.    Dispense:  20 tablet    Refill:  0   I, [name], DO, personally preformed the services described in this documentation.  All medical record entries made by the scribe were at my direction and in my presence.  I have reviewed the chart and discharge instructions (if applicable) and agree that the record reflects my personal performance and is accurate and complete. 07/22/2022  I,Rachel Rivera,acting as a scribe for [name], DO.,have documented all relevant documentation on the behalf of [name], DO,as directed by  [name], DO while in the presence of [place], DO, have reviewed all  documentation for this visit. The documentation on 07/22/22 for the exam, diagnosis, procedures, and orders are all accurate and complete.   [name], DO

## 2022-07-23 ENCOUNTER — Telehealth (INDEPENDENT_AMBULATORY_CARE_PROVIDER_SITE_OTHER): Payer: Medicare Other | Admitting: Pharmacist

## 2022-07-23 DIAGNOSIS — I129 Hypertensive chronic kidney disease with stage 1 through stage 4 chronic kidney disease, or unspecified chronic kidney disease: Secondary | ICD-10-CM

## 2022-07-23 DIAGNOSIS — N183 Chronic kidney disease, stage 3 unspecified: Secondary | ICD-10-CM

## 2022-07-23 DIAGNOSIS — E1122 Type 2 diabetes mellitus with diabetic chronic kidney disease: Secondary | ICD-10-CM

## 2022-07-23 MED ORDER — TRULICITY 1.5 MG/0.5ML ~~LOC~~ SOAJ: 1.5000 mg | SUBCUTANEOUS | 1 refills | Status: DC

## 2022-07-23 NOTE — Progress Notes (Signed)
Pharmacy Note  07/23/2022 Name: Richard [name] MRN: [id number] DOB: 1940-08-25  Subjective: Richard [name] is a 82 y.o. year old male who is a primary care patient of Carollee Herter, Alferd Apa, DO. Clinical Pharmacist Practitioner referral was placed to assist with medication and diabetes management.    Engaged with patient by telephone for follow up visit today.  Type 2 DM - Started Trulicity 0.'75mg'$  weekly 07/03/2022. Denies nausea or abdominal pain. Home blood glucose has improved since starting Trulicity - last week has been 142, 127, 132, 149, 117, 129 Previously took Metformin but was stopped due to rising Scr.  Patient is also taking [place] '10mg'$  daily (for DM and CKD) and glimepiride '2mg'$  - 2 tablets = '4mg'$  daily.  He has 1 more dose of Trulicity 0.'75mg'$   Patient also reports cost of medications is high when he reaches Medicare coverage gap. He is taking several high cost meds - Eliquis, Wilder Glade, Vascepa, Trulicity. We have screened for medication assistance programs before but patient did not qualify.   Objective: Review of patient status, including review of consultants reports, laboratory and other test data, was performed as part of comprehensive.  Lab Results  Component Value Date   CREATININE 1.89 (H) 06/26/2022   CREATININE 2.05 (H) 05/27/2022   CREATININE 1.96 (H) 05/22/2022    Lab Results  Component Value Date   HGBA1C 8.1 (H) 04/25/2022       Component Value Date/Time   CHOL 122 04/30/2022 1153   CHOL 162 11/23/2020 0828   TRIG 225.0 (H) 04/30/2022 1153   HDL 22.70 (L) 04/30/2022 1153   HDL 38 (L) 11/23/2020 0828   CHOLHDL 5 04/30/2022 1153   VLDL 45.0 (H) 04/30/2022 1153   LDLCALC 76 01/25/2022 1218   LDLCALC 89 11/23/2020 0828   LDLCALC  04/18/2020 0956     Comment:     . LDL cholesterol not calculated. Triglyceride levels greater than 400 mg/dL invalidate calculated LDL results. . Reference range: <100 . Desirable range <100 mg/dL for primary  prevention;   <70 mg/dL for patients with CHD or diabetic patients  with > or = 2 CHD risk factors. [name] LDL-C is now calculated using the Martin-Hopkins  calculation, which is a validated novel method providing  better accuracy than the Friedewald equation in the  estimation of LDL-C.  [name] et al. [name]. 7564;332(95): 2061-2068  (http://education.[facility].com/faq/FAQ164)    LDLDIRECT 70.0 04/30/2022 1153     Clinical ASCVD: Yes  The ASCVD Risk score (Arnett DK, et al., 2019) failed to calculate for the following reasons:   The 2019 ASCVD risk score is only valid for ages 43 to 62   The patient has a prior MI or stroke diagnosis    BP Readings from Last 3 Encounters:  07/22/22 122/80  06/26/22 (!) 116/56  06/03/22 118/70     Allergies  Allergen Reactions   Ace Inhibitors Swelling and Other (See Comments)    Angioedema    Benazepril Swelling and Other (See Comments)    Angioedema; he is not a candidate for any angiotensin receptor blockers because of this significant allergic reaction. Because of a history of documented adverse serious drug reaction;Medi Alert bracelet  is recommended   Entresto [Sacubitril-Valsartan] Swelling and Other (See Comments)    First-in-Class Angiotensin Receptor Neprilysin Inhibitor- Med was "red-flagged" by the patient's pharmacy for him to NOT take!   Hctz [Hydrochlorothiazide] Anaphylaxis and Swelling    Tongue and lip swelling    Aspirin Other (  See Comments)    Gastritis, can aspirin not take 325 mg aspirin    Medications Reviewed Today     Reviewed by Sanda Linger, CMA (Certified Medical Assistant) on 07/22/22 at 1307  Med List Status: <None>   Medication Order Taking? Sig Documenting Provider Last Dose Status Informant  acetaminophen (TYLENOL) 325 MG tablet [id number]  Take 650 mg by mouth at bedtime as needed for moderate pain or headache. [provider]  Active Self  allopurinol (ZYLOPRIM) 300 MG tablet  [id number]  Take 450 mg by mouth daily. [provider]  Active Self           Med Note [name], [name]   Wed Jan 16, 2022  2:57 PM) 1.5 tablets = 450 mg  aluminum hydroxide-magnesium carbonate (GAVISCON) 95-358 MG/15ML SUSP [id number]  Take 15 mLs by mouth as needed for indigestion or heartburn. [provider]  Active Self  apixaban (ELIQUIS) 2.5 MG TABS tablet [id number]  Take 1 tablet (2.5 mg total) by mouth 2 (two) times daily. Minus Breeding, MD  Active Self  Ascorbic Acid (VITAMIN C PO) [id number]  Take 1 tablet by mouth daily with breakfast. [provider]  Active Self  atorvastatin (LIPITOR) 80 MG tablet [id number]  TAKE 1 TABLET BY MOUTH EVERYDAY AT BEDTIME  Patient taking differently: Take 80 mg by mouth at bedtime.   Minus Breeding, MD  Active Self  azelastine (ASTELIN) 0.1 % nasal spray [id number]  PLACE 2 SPRAYS INTO BOTH NOSTRILS AT BEDTIME AS NEEDED FOR RHINITIS OR ALLERGIES. [name], DO  Active Self  Coenzyme Q10 (CO Q-10 PO) [id number]  Take 1 capsule by mouth daily. [provider]  Active Self  Dulaglutide (TRULICITY) 2.39 RV/2.0EB SOPN [id number] No Inject 0.75 mg into the skin once a week.  Patient not taking: Reported on 07/03/2022   [name], DO Not Taking Active   esomeprazole (NEXIUM) 40 MG capsule [id number]  Take 1 capsule (40 mg total) by mouth daily.  Patient taking differently: Take 40 mg by mouth daily before breakfast.   [name], MD  Active Self  ezetimibe (ZETIA) 10 MG tablet [id number]  TAKE 1 TABLET BY MOUTH EVERY DAY  Patient taking differently: Take 10 mg by mouth daily.   Minus Breeding, MD  Active Self  FARXIGA 10 MG TABS tablet [id number]  Take 10 mg by mouth in the morning. [provider]  Active Self  fenofibrate 160 MG tablet [id number]  Take 1 tablet (160 mg total) by mouth daily. Carollee Herter, Kendrick Fries R, DO  Active   folic acid (FOLVITE) 1 MG tablet [id number]  TAKE 2 TABLETS BY  MOUTH EVERY DAY [name], MD  Active   glimepiride (AMARYL) 2 MG tablet [id number]  Take 1 tablet (2 mg total) by mouth in the morning and at bedtime.  Patient taking differently: Take 4 mg by mouth in the morning and at bedtime.   Roma Schanz R, DO  Active   glucose blood (FREESTYLE LITE) test strip [id number]  USE TO TEST BLOOD SUGAR ONCE A DAY. DX CODE: E11.9 Carollee Herter, Alferd Apa, DO  Active   icosapent Ethyl (VASCEPA) 1 g capsule [id number]  Take 2 capsules (2 g total) by mouth 2 (two) times daily. Carollee Herter, Alferd Apa, DO  Active   Lancets (FREESTYLE) lancets [id number]  USE ONCE A DAY TO CHECK BLOOD SUGAR. Carollee Herter, Alferd Apa, DO  Active Self  Menthol-Camphor (ICY HOT ADVANCED  PAIN RELIEF EX) [id number]  Apply 1 application  topically daily as needed (to painful sites). [provider]  Active Self  metoprolol succinate (TOPROL-XL) 25 MG 24 hr tablet [id number]  Take 1 tablet (25 mg total) by mouth daily. Minus Breeding, MD  Active Self  nitroGLYCERIN (NITROSTAT) 0.4 MG SL tablet [id number]  PLACE 1 TABLET (0.4 MG TOTAL) UNDER THE TONGUE EVERY 5 (FIVE) MINUTES AS NEEDED FOR CHEST PAIN. Minus Breeding, MD  Active Self  potassium chloride SA (KLOR-CON M20) 20 MEQ tablet [id number]  Take 2 tablets (40 mEq total) by mouth daily. Modena Jansky, MD  Active Self  pregabalin (LYRICA) 150 MG capsule [id number]  TAKE 1 CAPSULE BY MOUTH TWICE A DAY Lowne Chase, Yvonne R, DO  Active Self  psyllium (METAMUCIL) 58.6 % powder [id number]  Take 1 packet by mouth daily as needed (for constipation- mix and drink). [provider]  Active Self  senna (SENOKOT) 8.6 MG TABS tablet [id number]  Take 2 tablets (17.2 mg total) by mouth at bedtime. This is for constipation.  Stop taking if you start having diarrhea.  Patient taking differently: Take 2 tablets by mouth daily as needed for mild constipation. This is for constipation.  Stop taking if you start having diarrhea.   Modena Jansky, MD  Active Self  torsemide (DEMADEX) 20 MG tablet [id number]  Take 2 tablets (40 mg total) by mouth in the morning. Do not take on 01/24/2022. Minus Breeding, MD  Active Self  Med List Note Henderson Newcomer, CPhT 06/10/13 8242): cpap machine 2 L of oxygen at night            Patient Active Problem List   Diagnosis Date Noted   Simple chronic bronchitis ([place]) 06/03/2022   Cellulitis of lower extremity 04/30/2022   Type 2 DM with CKD stage 3 and hypertension ([place]) 04/30/2022   Cellulitis of right leg 04/24/2022   Stage 3b chronic kidney disease (CKD) ([place]) 04/24/2022   Lactic acidosis 04/24/2022   Left upper quadrant pain 04/08/2022   Subacute cough 04/08/2022   Chronic combined systolic and diastolic heart failure ([place]) 02/06/2022   Acute exacerbation of CHF (congestive heart failure) ([place]) 01/16/2022   Hemoptysis 12/26/2021   Subclavian vein stenosis 12/23/2021   Strep throat 11/16/2021   Multiple superficial wounds with infection 01/04/2021   Cardiac pacemaker in situ 11/20/2020   S/P CABG x 4 11/20/2020   Ascending aortic aneurysm ([place]) 11/20/2020   Senile purpura ([place]) 07/27/2020   Porokeratosis 06/20/2020   Pain due to onychomycosis of toenails of both feet 12/14/2019   Educated about COVID-19 virus infection 11/25/2019   DM2 (diabetes mellitus, type 2) ([place]) 08/10/2019   Hyperlipidemia associated with type 2 diabetes mellitus ([place]) 08/10/2019   S/P left TKA 06/29/2019   Peripheral neuropathy 07/27/2018   Uncontrolled type 2 diabetes mellitus with hyperglycemia ([place]) 05/13/2018   Diabetic peripheral neuropathy associated with type 2 diabetes mellitus ([place]) 05/13/2018   Pansinusitis 05/13/2018   Severe aortic stenosis    Complete heart block (HCC)    Paroxysmal atrial fibrillation (HCC)    Acute on chronic diastolic CHF (congestive heart failure), NYHA class 2 (HCC)    Left arm swelling    Status post transcatheter aortic valve replacement (TAVR) using  bioprosthesis 09/09/2017   Demand ischemia of myocardium    Typical atrial flutter (HCC)    Acute on chronic diastolic heart failure ([place])    GIB (gastrointestinal bleeding) 08/16/2017   Hyperlipidemia 08/05/2017  NSTEMI (non-ST elevated myocardial infarction) ([place])    Aortic stenosis, severe 07/25/2017   Pancytopenia ([place]) 07/24/2017   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 ([place]) 02/27/2017   Bradycardia 01/04/2017   Coronary artery disease 01/04/2017   Stenosis of carotid artery 01/04/2017   Obesity 09/18/2016   Wenckebach block    Disorder associated with type II diabetes mellitus ([place]) 05/21/2013   Spinal stenosis of lumbar region 04/08/2013   Anemia, iron deficiency 11/29/2011   B12 deficiency anemia 07/30/2011   OSA (obstructive sleep apnea) 07/06/2010   CAD, ARTERY BYPASS GRAFT 08/22/2009   Essential hypertension 03/29/2009   Bilateral lower extremity edema 02/21/2009   Hyperlipidemia LDL goal <70 11/26/2008   GOUT 08/17/2006     Medication Assistance:   High med cost when in coverage gap. Screened for medication assistance program in past - patient did not qualify. Screened for [employer] for hypercholesterolemia. Patient did qualify. Approved for $2500 thru 06/03/2023   Assessment / Plan: Type 2 DM - not at goal, blood glucose improved since starting Trulicity Has 1 more dose of Trulicity 0.'75mg'$  weekly for next week and then will increase dose to Trulicity 1.5 weekly for 4 weeks. Will reassess around 07/23/2022. If able will plan to increase to 1.'5mg'$  weekly.  Continue to check blood glucose daily  Medication management:  Reviewed and updated medication list Reviewed refill history and adherence  Follow Up:  Telephone follow up appointment with care management team member scheduled for:  08/12/2022   [name], PharmD Clinical Pharmacist [facility] [phone]

## 2022-07-26 ENCOUNTER — Ambulatory Visit: Payer: Medicare Other

## 2022-07-26 ENCOUNTER — Inpatient Hospital Stay: Payer: Medicare Other

## 2022-07-26 ENCOUNTER — Ambulatory Visit: Payer: Medicare Other | Admitting: Family

## 2022-07-29 DIAGNOSIS — N2581 Secondary hyperparathyroidism of renal origin: Secondary | ICD-10-CM | POA: Diagnosis not present

## 2022-07-29 DIAGNOSIS — R609 Edema, unspecified: Secondary | ICD-10-CM | POA: Diagnosis not present

## 2022-07-29 DIAGNOSIS — N1832 Chronic kidney disease, stage 3b: Secondary | ICD-10-CM | POA: Diagnosis not present

## 2022-07-29 DIAGNOSIS — D631 Anemia in chronic kidney disease: Secondary | ICD-10-CM | POA: Diagnosis not present

## 2022-07-29 DIAGNOSIS — I129 Hypertensive chronic kidney disease with stage 1 through stage 4 chronic kidney disease, or unspecified chronic kidney disease: Secondary | ICD-10-CM | POA: Diagnosis not present

## 2022-07-30 ENCOUNTER — Encounter: Payer: Self-pay | Admitting: Family

## 2022-07-30 ENCOUNTER — Inpatient Hospital Stay (HOSPITAL_BASED_OUTPATIENT_CLINIC_OR_DEPARTMENT_OTHER): Payer: Medicare Other | Admitting: Family

## 2022-07-30 ENCOUNTER — Inpatient Hospital Stay: Payer: Medicare Other | Attending: Hematology & Oncology

## 2022-07-30 ENCOUNTER — Inpatient Hospital Stay: Payer: Medicare Other

## 2022-07-30 VITALS — BP 126/60 | HR 66 | Temp 97.9°F | Resp 17 | Wt 239.1 lb

## 2022-07-30 DIAGNOSIS — D519 Vitamin B12 deficiency anemia, unspecified: Secondary | ICD-10-CM

## 2022-07-30 DIAGNOSIS — D509 Iron deficiency anemia, unspecified: Secondary | ICD-10-CM | POA: Diagnosis not present

## 2022-07-30 DIAGNOSIS — D510 Vitamin B12 deficiency anemia due to intrinsic factor deficiency: Secondary | ICD-10-CM | POA: Insufficient documentation

## 2022-07-30 DIAGNOSIS — C8332 Diffuse large B-cell lymphoma, intrathoracic lymph nodes: Secondary | ICD-10-CM | POA: Diagnosis not present

## 2022-07-30 DIAGNOSIS — N1832 Chronic kidney disease, stage 3b: Secondary | ICD-10-CM | POA: Diagnosis not present

## 2022-07-30 DIAGNOSIS — N183 Chronic kidney disease, stage 3 unspecified: Secondary | ICD-10-CM | POA: Diagnosis not present

## 2022-07-30 DIAGNOSIS — G629 Polyneuropathy, unspecified: Secondary | ICD-10-CM | POA: Diagnosis not present

## 2022-07-30 DIAGNOSIS — Z79899 Other long term (current) drug therapy: Secondary | ICD-10-CM | POA: Insufficient documentation

## 2022-07-30 DIAGNOSIS — D518 Other vitamin B12 deficiency anemias: Secondary | ICD-10-CM

## 2022-07-30 DIAGNOSIS — D508 Other iron deficiency anemias: Secondary | ICD-10-CM

## 2022-07-30 LAB — CBC WITH DIFFERENTIAL (CANCER CENTER ONLY)
Abs Immature Granulocytes: 0.04 10*3/uL (ref 0.00–0.07)
Basophils Absolute: 0 10*3/uL (ref 0.0–0.1)
Basophils Relative: 1 %
Eosinophils Absolute: 0.1 10*3/uL (ref 0.0–0.5)
Eosinophils Relative: 2 %
HCT: 45.1 % (ref 39.0–52.0)
Hemoglobin: 14.1 g/dL (ref 13.0–17.0)
Immature Granulocytes: 1 %
Lymphocytes Relative: 23 %
Lymphs Abs: 1.7 10*3/uL (ref 0.7–4.0)
MCH: 28.7 pg (ref 26.0–34.0)
MCHC: 31.3 g/dL (ref 30.0–36.0)
MCV: 91.9 fL (ref 80.0–100.0)
Monocytes Absolute: 0.4 10*3/uL (ref 0.1–1.0)
Monocytes Relative: 5 %
Neutro Abs: 5 10*3/uL (ref 1.7–7.7)
Neutrophils Relative %: 68 %
Platelet Count: 200 10*3/uL (ref 150–400)
RBC: 4.91 MIL/uL (ref 4.22–5.81)
RDW: 15.9 % — ABNORMAL HIGH (ref 11.5–15.5)
WBC Count: 7.3 10*3/uL (ref 4.0–10.5)
nRBC: 0 % (ref 0.0–0.2)

## 2022-07-30 LAB — CMP (CANCER CENTER ONLY)
ALT: 27 U/L (ref 0–44)
AST: 26 U/L (ref 15–41)
Albumin: 4.8 g/dL (ref 3.5–5.0)
Alkaline Phosphatase: 63 U/L (ref 38–126)
Anion gap: 11 (ref 5–15)
BUN: 47 mg/dL — ABNORMAL HIGH (ref 8–23)
CO2: 28 mmol/L (ref 22–32)
Calcium: 10.4 mg/dL — ABNORMAL HIGH (ref 8.9–10.3)
Chloride: 99 mmol/L (ref 98–111)
Creatinine: 1.88 mg/dL — ABNORMAL HIGH (ref 0.61–1.24)
GFR, Estimated: 35 mL/min — ABNORMAL LOW (ref >60)
Glucose, Bld: 269 mg/dL — ABNORMAL HIGH (ref 70–99)
Potassium: 4.2 mmol/L (ref 3.5–5.1)
Sodium: 138 mmol/L (ref 135–145)
Total Bilirubin: 0.7 mg/dL (ref 0.3–1.2)
Total Protein: 7.1 g/dL (ref 6.5–8.1)

## 2022-07-30 LAB — IRON AND IRON BINDING CAPACITY (CC-WL,HP ONLY)
Iron: 95 ug/dL (ref 45–182)
Saturation Ratios: 26 % (ref 17.9–39.5)
TIBC: 361 ug/dL (ref 250–450)
UIBC: 266 ug/dL (ref 117–376)

## 2022-07-30 LAB — LACTATE DEHYDROGENASE: LDH: 140 U/L (ref 98–192)

## 2022-07-30 LAB — FERRITIN: Ferritin: 1477 ng/mL — ABNORMAL HIGH (ref 24–336)

## 2022-07-30 LAB — RETICULOCYTES
Immature Retic Fract: 22.1 % — ABNORMAL HIGH (ref 2.3–15.9)
RBC.: 4.89 MIL/uL (ref 4.22–5.81)
Retic Count, Absolute: 83.1 10*3/uL (ref 19.0–186.0)
Retic Ct Pct: 1.7 % (ref 0.4–3.1)

## 2022-07-30 LAB — VITAMIN B12: Vitamin B-12: 378 pg/mL (ref 180–914)

## 2022-07-30 MED ORDER — CYANOCOBALAMIN 1000 MCG/ML IJ SOLN
1000.0000 ug | Freq: Once | INTRAMUSCULAR | Status: AC
  Administered 2022-07-30: 1000 ug via INTRAMUSCULAR
  Filled 2022-07-30: qty 1

## 2022-07-30 MED ORDER — DENOSUMAB 120 MG/1.7ML ~~LOC~~ SOLN
120.0000 mg | Freq: Once | SUBCUTANEOUS | Status: AC
  Administered 2022-07-30: 120 mg via SUBCUTANEOUS
  Filled 2022-07-30: qty 1.7

## 2022-07-30 NOTE — Progress Notes (Signed)
Hematology and Oncology Follow Up Visit  Richard [name] [id number] 12-03-1940 82 y.o. 07/30/2022   Principle Diagnosis:  Diffuse large cell non-Hodgkin's lymphoma (IPI = 3) - NOT "double hit" Pernicious anemia Iron deficiency secondary to bleeding   Past Therapy: R-CHOP - s/p cycle 8 - completed 08/2017   Current Therapy:        Vitamin B12 1 mg IM every month Xgeva 120 mg subcu q 3 months - next dose due in 07/2022 IV iron as indicated   Interim History:  Richard [name] is here today for follow-up and B12/Xgeva. He is doing well. He states that he recently had strep throat but this has since resolved after completing an oral antibiotic.  Energy is good and plans to golf tomorrow.  No fever, chills, n/v, cough, rash, dizziness, SOB, chest pain, palpitations, abdominal pain or changes in bowel or bladder habits.  No blood loss or petechiae.  No swelling or tenderness in his extremities at this time.  Neuropathy in his feet unchanged.  No falls or syncope reported.  Appetite and hydration are good. Weight is stable at 239 lbs.   ECOG Performance Status: 1 - Symptomatic but completely ambulatory  Medications:  Allergies as of 07/30/2022       Reactions   Ace Inhibitors Swelling, Other (See Comments)   Angioedema   Benazepril Swelling, Other (See Comments)   Angioedema; he is not a candidate for any angiotensin receptor blockers because of this significant allergic reaction. Because of a history of documented adverse serious drug reaction;Medi Alert bracelet  is recommended   Entresto [sacubitril-valsartan] Swelling, Other (See Comments)   First-in-Class Angiotensin Receptor Neprilysin Inhibitor- Med was "red-flagged" by the patient's pharmacy for him to NOT take!   Hctz [hydrochlorothiazide] Anaphylaxis, Swelling   Tongue and lip swelling   Aspirin Other (See Comments)   Gastritis, can aspirin not take 325 mg aspirin        Medication List        Accurate as of July 30, 2022 12:13 PM. If you have any questions, ask your nurse or doctor.          acetaminophen 325 MG tablet Commonly known as: TYLENOL Take 650 mg by mouth at bedtime as needed for moderate pain or headache.   allopurinol 300 MG tablet Commonly known as: ZYLOPRIM Take 450 mg by mouth daily.   aluminum hydroxide-magnesium carbonate 95-358 MG/15ML Susp Commonly known as: GAVISCON Take 15 mLs by mouth as needed for indigestion or heartburn.   amoxicillin 875 MG tablet Commonly known as: AMOXIL Take 1 tablet (875 mg total) by mouth 2 (two) times daily for 10 days.   apixaban 2.5 MG Tabs tablet Commonly known as: ELIQUIS Take 1 tablet (2.5 mg total) by mouth 2 (two) times daily.   atorvastatin 80 MG tablet Commonly known as: LIPITOR TAKE 1 TABLET BY MOUTH EVERYDAY AT BEDTIME What changed: See the new instructions.   azelastine 0.1 % nasal spray Commonly known as: ASTELIN PLACE 2 SPRAYS INTO BOTH NOSTRILS AT BEDTIME AS NEEDED FOR RHINITIS OR ALLERGIES.   CO Q-10 PO Take 1 capsule by mouth daily.   esomeprazole 40 MG capsule Commonly known as: NexIUM Take 1 capsule (40 mg total) by mouth daily. What changed: when to take this   ezetimibe 10 MG tablet Commonly known as: ZETIA TAKE 1 TABLET BY MOUTH EVERY DAY   Farxiga 10 MG Tabs tablet Generic drug: dapagliflozin propanediol Take 10 mg by mouth in the morning.  fenofibrate 160 MG tablet Take 1 tablet (160 mg total) by mouth daily.   folic acid 1 MG tablet Commonly known as: FOLVITE TAKE 2 TABLETS BY MOUTH EVERY DAY   freestyle lancets USE ONCE A DAY TO CHECK BLOOD SUGAR.   FREESTYLE LITE test strip Generic drug: glucose blood USE TO TEST BLOOD SUGAR ONCE A DAY. DX CODE: E11.9   glimepiride 2 MG tablet Commonly known as: AMARYL Take 1 tablet (2 mg total) by mouth in the morning and at bedtime. What changed: how much to take   icosapent Ethyl 1 g capsule Commonly known as: VASCEPA Take 2 capsules (2 g  total) by mouth 2 (two) times daily.   ICY HOT ADVANCED PAIN RELIEF EX Apply 1 application  topically daily as needed (to painful sites).   metoprolol succinate 25 MG 24 hr tablet Commonly known as: TOPROL-XL Take 1 tablet (25 mg total) by mouth daily.   nitroGLYCERIN 0.4 MG SL tablet Commonly known as: NITROSTAT PLACE 1 TABLET (0.4 MG TOTAL) UNDER THE TONGUE EVERY 5 (FIVE) MINUTES AS NEEDED FOR CHEST PAIN.   potassium chloride SA 20 MEQ tablet Commonly known as: K[place]-Con M20 Take 2 tablets (40 mEq total) by mouth daily.   pregabalin 150 MG capsule Commonly known as: LYRICA TAKE 1 CAPSULE BY MOUTH TWICE A DAY   psyllium 58.6 % powder Commonly known as: METAMUCIL Take 1 packet by mouth daily as needed (for constipation- mix and drink).   senna 8.6 MG Tabs tablet Commonly known as: SENOKOT Take 2 tablets (17.2 mg total) by mouth at bedtime. This is for constipation.  Stop taking if you start having diarrhea. What changed:  when to take this reasons to take this   torsemide 20 MG tablet Commonly known as: DEMADEX Take 2 tablets (40 mg total) by mouth in the morning. Do not take on 01/24/2022.   Trulicity 0.09 FG/1.8EX Sopn Generic drug: Dulaglutide Inject 0.75 mg into the skin once a week.   Trulicity 1.5 HB/7.1IR Sopn Generic drug: Dulaglutide Inject 1.5 mg into the skin once a week.   VITAMIN C PO Take 1 tablet by mouth daily with breakfast.        Allergies:  Allergies  Allergen Reactions   Ace Inhibitors Swelling and Other (See Comments)    Angioedema    Benazepril Swelling and Other (See Comments)    Angioedema; he is not a candidate for any angiotensin receptor blockers because of this significant allergic reaction. Because of a history of documented adverse serious drug reaction;Medi Alert bracelet  is recommended   Entresto [Sacubitril-Valsartan] Swelling and Other (See Comments)    First-in-Class Angiotensin Receptor Neprilysin Inhibitor- Med was  "red-flagged" by the patient's pharmacy for him to NOT take!   Hctz [Hydrochlorothiazide] Anaphylaxis and Swelling    Tongue and lip swelling    Aspirin Other (See Comments)    Gastritis, can aspirin not take 325 mg aspirin    Past Medical History, Surgical history, Social history, and Family History were reviewed and updated.  Review of Systems: All other 10 point review of systems is negative.   Physical Exam:  weight is 239 lb 1.9 oz (108.5 kg). His oral temperature is 97.9 F (36.6 C). His blood pressure is 126/60 and his pulse is 66. His respiration is 17 and oxygen saturation is 95%.   Wt Readings from Last 3 Encounters:  07/30/22 239 lb 1.9 oz (108.5 kg)  07/22/22 239 lb 12.8 oz (108.8 kg)  06/26/22 239 lb  1.3 oz (108.4 kg)    Ocular: Sclerae unicteric, pupils equal, round and reactive to light Ear-nose-throat: Oropharynx clear, dentition fair Lymphatic: No cervical or supraclavicular adenopathy Lungs no rales or rhonchi, good excursion bilaterally Heart regular rate and rhythm, no murmur appreciated Abd soft, nontender, positive bowel sounds MSK no focal spinal tenderness, no joint edema Neuro: non-focal, well-oriented, appropriate affect Breasts: Deferred   Lab Results  Component Value Date   WBC 7.3 07/30/2022   HGB 14.1 07/30/2022   HCT 45.1 07/30/2022   MCV 91.9 07/30/2022   PLT 200 07/30/2022   Lab Results  Component Value Date   FERRITIN 1,309 (H) 06/26/2022   IRON 91 06/26/2022   TIBC 379 06/26/2022   UIBC 288 06/26/2022   IRONPCTSAT 24 06/26/2022   Lab Results  Component Value Date   RETICCTPCT 1.7 07/30/2022   RBC 4.91 07/30/2022   RBC 4.89 07/30/2022   RETICCTABS 52.1 11/22/2011   No results found for: "KPAFRELGTCHN", "LAMBDASER", "[facility]" Lab Results  Component Value Date   IGA 159 03/09/2012   Lab Results  Component Value Date   ALBUMINELP 4.2 07/27/2018   MSPIKE Not Observed 07/27/2018     Chemistry      Component Value  Date/Time   NA 140 06/26/2022 1301   NA 138 02/08/2022 0811   NA 144 06/27/2017 0857   K 3.8 06/26/2022 1301   K 3.9 06/27/2017 0857   CL 99 06/26/2022 1301   CL 103 06/27/2017 0857   CO2 30 06/26/2022 1301   CO2 26 06/27/2017 0857   BUN 47 (H) 06/26/2022 1301   BUN 46 (H) 02/08/2022 0811   BUN 12 06/27/2017 0857   CREATININE 1.89 (H) 06/26/2022 1301   CREATININE 1.65 (H) 04/18/2020 0956      Component Value Date/Time   CALCIUM 9.9 06/26/2022 1301   CALCIUM 9.2 06/27/2017 0857   ALKPHOS 61 06/26/2022 1301   ALKPHOS 128 (H) 06/27/2017 0857   AST 20 06/26/2022 1301   ALT 21 06/26/2022 1301   ALT 24 06/27/2017 0857   BILITOT 0.6 06/26/2022 1301       Impression and Plan: Richard [name] is a very pleasant 82 yo caucasian gentleman with diffuse large B-cell lymphoma (not "double hit" lymphoma). He completed treatment in February 2019.  Iron studies pending.  B 12 and Xgeva given.  Follow-up in 1 month.   Lottie Dawson, NP 1/30/202412:13 PM

## 2022-07-30 NOTE — Patient Instructions (Signed)
Denosumab Injection (Oncology) What is this medication? DENOSUMAB (den oh SUE mab) prevents weakened bones caused by cancer. It may also be used to treat noncancerous bone tumors that cannot be removed by surgery. It can also be used to treat high calcium levels in the blood caused by cancer. It works by blocking a protein that causes bones to break down quickly. This slows down the release of calcium from bones, which lowers calcium levels in your blood. It also makes your bones stronger and less likely to break (fracture). This medicine may be used for other purposes; ask your health care provider or pharmacist if you have questions. COMMON BRAND NAME(S): XGEVA What should I tell my care team before I take this medication? They need to know if you have any of these conditions: Dental disease Having surgery or tooth extraction Infection Kidney disease Low levels of calcium or vitamin D in the blood Malnutrition On hemodialysis Skin conditions or sensitivity Thyroid or parathyroid disease An unusual reaction to denosumab, other medications, foods, dyes, or preservatives Pregnant or trying to get pregnant Breast-feeding How should I use this medication? This medication is for injection under the skin. It is given by your care team in a hospital or clinic setting. A special MedGuide will be given to you before each treatment. Be sure to read this information carefully each time. Talk to your care team about the use of this medication in children. While it may be prescribed for children as young as 13 years for selected conditions, precautions do apply. Overdosage: If you think you have taken too much of this medicine contact a poison control center or emergency room at once. NOTE: This medicine is only for you. Do not share this medicine with others. What if I miss a dose? Keep appointments for follow-up doses. It is important not to miss your dose. Call your care team if you are unable to  keep an appointment. What may interact with this medication? Do not take this medication with any of the following: Other medications containing denosumab This medication may also interact with the following: Medications that lower your chance of fighting infection Steroid medications, such as prednisone or cortisone This list may not describe all possible interactions. Give your health care provider a list of all the medicines, herbs, non-prescription drugs, or dietary supplements you use. Also tell them if you smoke, drink alcohol, or use illegal drugs. Some items may interact with your medicine. What should I watch for while using this medication? Your condition will be monitored carefully while you are receiving this medication. You may need blood work while taking this medication. This medication may increase your risk of getting an infection. Call your care team for advice if you get a fever, chills, sore throat, or other symptoms of a cold or flu. Do not treat yourself. Try to avoid being around people who are sick. You should make sure you get enough calcium and vitamin D while you are taking this medication, unless your care team tells you not to. Discuss the foods you eat and the vitamins you take with your care team. Some people who take this medication have severe bone, joint, or muscle pain. This medication may also increase your risk for jaw problems or a broken thigh bone. Tell your care team right away if you have severe pain in your jaw, bones, joints, or muscles. Tell your care team if you have any pain that does not go away or that gets worse. Talk  to your care team if you may be pregnant. Serious birth defects can occur if you take this medication during pregnancy and for 5 months after the last dose. You will need a negative pregnancy test before starting this medication. Contraception is recommended while taking this medication and for 5 months after the last dose. Your care team  can help you find the option that works for you. What side effects may I notice from receiving this medication? Side effects that you should report to your care team as soon as possible: Allergic reactions--skin rash, itching, hives, swelling of the face, lips, tongue, or throat Bone, joint, or muscle pain Low calcium level--muscle pain or cramps, confusion, tingling, or numbness in the hands or feet Osteonecrosis of the jaw--pain, swelling, or redness in the mouth, numbness of the jaw, poor healing after dental work, unusual discharge from the mouth, visible bones in the mouth Side effects that usually do not require medical attention (report to your care team if they continue or are bothersome): Cough Diarrhea Fatigue Headache Nausea This list may not describe all possible side effects. Call your doctor for medical advice about side effects. You may report side effects to FDA at 1-800-FDA-1088. Where should I keep my medication? This medication is given in a hospital or clinic. It will not be stored at home. NOTE: This sheet is a summary. It may not cover all possible information. If you have questions about this medicine, talk to your doctor, pharmacist, or health care provider.  2023 Elsevier/Gold Standard (2021-11-05 00:00:00) Vitamin B12 Injection What is this medication? Vitamin B12 (VAHY tuh min B12) prevents and treats low vitamin B12 levels in your body. It is used in people who do not get enough vitamin B12 from their diet or when their digestive tract does not absorb enough. Vitamin B12 plays an important role in maintaining the health of your nervous system and red blood cells. This medicine may be used for other purposes; ask your health care provider or pharmacist if you have questions. COMMON BRAND NAME(S): B-12 Compliance Kit, B-12 Injection Kit, Cyomin, Dodex, LA-12, Nutri-Twelve, Physicians EZ Use B-12, Primabalt What should I tell my care team before I take this  medication? They need to know if you have any of these conditions: Kidney disease Leber's disease Megaloblastic anemia An unusual or allergic reaction to cyanocobalamin, cobalt, other medications, foods, dyes, or preservatives Pregnant or trying to get pregnant Breast-feeding How should I use this medication? This medication is injected into a muscle or deeply under the skin. It is usually given in a clinic or care team's office. However, your care team may teach you how to inject yourself. Follow all instructions. Talk to your care team about the use of this medication in children. Special care may be needed. Overdosage: If you think you have taken too much of this medicine contact a poison control center or emergency room at once. NOTE: This medicine is only for you. Do not share this medicine with others. What if I miss a dose? If you are given your dose at a clinic or care team's office, call to reschedule your appointment. If you give your own injections, and you miss a dose, take it as soon as you can. If it is almost time for your next dose, take only that dose. Do not take double or extra doses. What may interact with this medication? Alcohol Colchicine This list may not describe all possible interactions. Give your health care provider a list of  all the medicines, herbs, non-prescription drugs, or dietary supplements you use. Also tell them if you smoke, drink alcohol, or use illegal drugs. Some items may interact with your medicine. What should I watch for while using this medication? Visit your care team regularly. You may need blood work done while you are taking this medication. You may need to follow a special diet. Talk to your care team. Limit your alcohol intake and avoid smoking to get the best benefit. What side effects may I notice from receiving this medication? Side effects that you should report to your care team as soon as possible: Allergic reactions--skin rash,  itching, hives, swelling of the face, lips, tongue, or throat Swelling of the ankles, hands, or feet Trouble breathing Side effects that usually do not require medical attention (report to your care team if they continue or are bothersome): Diarrhea This list may not describe all possible side effects. Call your doctor for medical advice about side effects. You may report side effects to FDA at 1-800-FDA-1088. Where should I keep my medication? Keep out of the reach of children. Store at room temperature between 15 and 30 degrees C (59 and 85 degrees F). Protect from light. Throw away any unused medication after the expiration date. NOTE: This sheet is a summary. It may not cover all possible information. If you have questions about this medicine, talk to your doctor, pharmacist, or health care provider.  2023 Elsevier/Gold Standard (2007-08-08 00:00:00)

## 2022-08-04 ENCOUNTER — Other Ambulatory Visit: Payer: Self-pay | Admitting: Family Medicine

## 2022-08-05 ENCOUNTER — Other Ambulatory Visit: Payer: Self-pay | Admitting: Family Medicine

## 2022-08-05 ENCOUNTER — Telehealth: Payer: Self-pay | Admitting: Cardiology

## 2022-08-05 DIAGNOSIS — E1122 Type 2 diabetes mellitus with diabetic chronic kidney disease: Secondary | ICD-10-CM

## 2022-08-05 MED ORDER — POTASSIUM CHLORIDE CRYS ER 20 MEQ PO TBCR: 40.0000 meq | EXTENDED_RELEASE_TABLET | Freq: Every day | ORAL | 2 refills | Status: DC

## 2022-08-05 NOTE — Telephone Encounter (Signed)
*  STAT* If patient is at the pharmacy, call can be transferred to refill team.   1. Which medications need to be refilled? (please list name of each medication and dose if known)  potassium chloride SA (KLOR-CON M20) 20 MEQ tablet  2. Which pharmacy/location (including street and city if local pharmacy) is medication to be sent to? [facility] [place] [place] [facility] [address]  3. Do they need a 30 day or 90 day supply?  90 day supply  Patient states he is completely out of medication.

## 2022-08-12 ENCOUNTER — Ambulatory Visit: Payer: Medicare Other | Admitting: Pharmacist

## 2022-08-12 DIAGNOSIS — N183 Chronic kidney disease, stage 3 unspecified: Secondary | ICD-10-CM

## 2022-08-12 NOTE — Progress Notes (Signed)
Pharmacy Note  08/12/2022 Name: Richard [name] MRN: [id number] DOB: 29-Aug-1940  Subjective: Richard [name] is a 82 y.o. year old male who is a primary care patient of Carollee Herter, Alferd Apa, DO. Clinical Pharmacist Practitioner referral was placed to assist with medication and diabetes management.    Engaged with patient by telephone for follow up visit today.  Type 2 DM -  Current therapy - Trulicity [id number] weekly, glimepiride 27m - take 2 tablets = 456mdaily and Farxiga 1021maily.  Increased Trulicity from 0.7A999333 1.5XX123456out 2 weeks ago. Patient is tolerating well. Blood glucose has improved from 190's.   Home blood glucose:  Highest - 160; lowest - 114; average blood glucose = 135.   Past therapies: metformin stopped due to rising Scr.   Medication management:  He was able to use [employer] funds to get Vascepa (saved him $500).  He has several high cost meds that cause him to reach coverage gap but due to income does not always qualify for medication assistance programs.   Objective: Review of patient status, including review of consultants reports, laboratory and other test data, was performed as part of comprehensive.  Lab Results  Component Value Date   CREATININE 1.88 (H) 07/30/2022   CREATININE 1.89 (H) 06/26/2022   CREATININE 2.05 (H) 05/27/2022    Lab Results  Component Value Date   HGBA1C 8.1 (H) 04/25/2022       Component Value Date/Time   CHOL 122 04/30/2022 1153   CHOL 162 11/23/2020 0828   TRIG 225.0 (H) 04/30/2022 1153   HDL 22.70 (L) 04/30/2022 1153   HDL 38 (L) 11/23/2020 0828   CHOLHDL 5 04/30/2022 1153   VLDL 45.0 (H) 04/30/2022 1153   LDLCALC 76 01/25/2022 1218   LDLCALC 89 11/23/2020 0828   LDLCALC  04/18/2020 0956     Comment:     . LDL cholesterol not calculated. Triglyceride levels greater than 400 mg/dL invalidate calculated LDL results. . Reference range: <100 . Desirable range <100 mg/dL for primary prevention;   <70 mg/dL for  patients with CHD or diabetic patients  with > or = 2 CHD risk factors. . LMarland KitchenL-C is now calculated using the Martin-Hopkins  calculation, which is a validated novel method providing  better accuracy than the Friedewald equation in the  estimation of LDL-C.  MarCresenciano Genre al. JAMAnnamaria Helling01WG:[id number]  (http://education.[employer].com/faq/FAQ164)    LDLDIRECT 70.0 04/30/2022 1153     Clinical ASCVD: Yes  The ASCVD Risk score (Arnett DK, et al., 2019) failed to calculate for the following reasons:   The 2019 ASCVD risk score is only valid for ages 40 46 79 11The patient has a prior MI or stroke diagnosis    BP Readings from Last 3 Encounters:  07/30/22 126/60  07/22/22 122/80  06/26/22 (!) 116/56     Allergies  Allergen Reactions   Ace Inhibitors Swelling and Other (See Comments)    Angioedema    Benazepril Swelling and Other (See Comments)    Angioedema; he is not a candidate for any angiotensin receptor blockers because of this significant allergic reaction. Because of a history of documented adverse serious drug reaction;Medi Alert bracelet  is recommended   Entresto [Sacubitril-Valsartan] Swelling and Other (See Comments)    First-in-Class Angiotensin Receptor Neprilysin Inhibitor- Med was "red-flagged" by the patient's pharmacy for him to NOT take!   Hctz [Hydrochlorothiazide] Anaphylaxis and Swelling    Tongue and lip swelling  Aspirin Other (See Comments)    Gastritis, can aspirin not take 325 mg aspirin    Medications Reviewed Today     Reviewed by [name], RPH-CPP (Pharmacist) on 08/12/22 at 1422  Med List Status: <None>   Medication Order Taking? Sig Documenting Provider Last Dose Status Informant  acetaminophen (TYLENOL) 325 MG tablet [id number] Yes Take 650 mg by mouth at bedtime as needed for moderate pain or headache. [provider] Taking Active Self  allopurinol (ZYLOPRIM) 300 MG tablet [id number] Yes Take 450 mg by mouth daily.  [provider] Taking Active Self           Med Note [name], [name]   Wed Jan 16, 2022  2:57 PM) 1.5 tablets = 450 mg  aluminum hydroxide-magnesium carbonate (GAVISCON) 95-358 MG/15ML SUSP [id number] Yes Take 15 mLs by mouth as needed for indigestion or heartburn. [provider] Taking Active Self  apixaban (ELIQUIS) 2.5 MG TABS tablet [id number] Yes Take 1 tablet (2.5 mg total) by mouth 2 (two) times daily. Minus Breeding, MD Taking Active Self  Ascorbic Acid (VITAMIN C PO) [id number] Yes Take 1 tablet by mouth daily with breakfast. [provider] Taking Active Self  atorvastatin (LIPITOR) 80 MG tablet [id number] Yes TAKE 1 TABLET BY MOUTH EVERYDAY AT BEDTIME  Patient taking differently: Take 80 mg by mouth at bedtime.   Minus Breeding, MD Taking Active Self  Azelastine HCl 137 MCG/SPRAY SOLN [id number] Yes PLACE 2 SPRAYS INTO BOTH NOSTRILS AT BEDTIME AS NEEDED FOR RHINITIS OR ALLERGIES. Roma Schanz R, DO Taking Active   Coenzyme Q10 (CO Q-10 PO) [id number] Yes Take 1 capsule by mouth daily. [provider] Taking Active Self  Dulaglutide (TRULICITY) 1.5 0000000 SOPN [id number] Yes Inject 1.5 mg into the skin once a week. [name], DO Taking Active   esomeprazole (NEXIUM) 40 MG capsule [id number] Yes Take 1 capsule (40 mg total) by mouth daily.  Patient taking differently: Take 40 mg by mouth daily before breakfast.   [name], MD Taking Active Self  ezetimibe (ZETIA) 10 MG tablet [id number] Yes TAKE 1 TABLET BY MOUTH EVERY DAY  Patient taking differently: Take 10 mg by mouth daily.   Minus Breeding, MD Taking Active Self  FARXIGA 10 MG TABS tablet [id number] Yes Take 10 mg by mouth in the morning. [provider] Taking Active Self  fenofibrate 160 MG tablet [id number] Yes Take 1 tablet (160 mg total) by mouth daily. Roma Schanz R, DO Taking Active   folic acid (FOLVITE) 1 MG tablet [id number] Yes TAKE 2 TABLETS BY  MOUTH EVERY DAY Ennever, Rudell Cobb, MD Taking Active   glimepiride (AMARYL) 2 MG tablet [id number] Yes TAKE 1 TABLET (2 MG TOTAL) BY MOUTH IN THE MORNING AND AT BEDTIME. Roma Schanz R, DO Taking Active   glucose blood (FREESTYLE LITE) test strip [id number] Yes USE TO TEST BLOOD SUGAR ONCE A DAY. DX CODE: E11.9 [name], DO Taking Active   icosapent Ethyl (VASCEPA) 1 g capsule [id number] Yes Take 2 capsules (2 g total) by mouth 2 (two) times daily. Carollee Herter, Alferd Apa, DO Taking Active   Lancets (FREESTYLE) lancets [id number] Yes USE ONCE A DAY TO CHECK BLOOD SUGAR. [name], DO Taking Active Self  Menthol-Camphor (ICY HOT ADVANCED PAIN RELIEF EX) [id number] Yes Apply 1 application  topically daily as needed (to painful sites). [provider] Taking Active Self  metoprolol succinate (TOPROL-XL) 25 MG 24  hr tablet [id number] Yes Take 1 tablet (25 mg total) by mouth daily. Minus Breeding, MD Taking Active Self  nitroGLYCERIN (NITROSTAT) 0.4 MG SL tablet [id number] Yes PLACE 1 TABLET (0.4 MG TOTAL) UNDER THE TONGUE EVERY 5 (FIVE) MINUTES AS NEEDED FOR CHEST PAIN. Minus Breeding, MD Taking Active Self  potassium chloride SA (KLOR-CON M20) 20 MEQ tablet [id number] Yes Take 2 tablets (40 mEq total) by mouth daily. Minus Breeding, MD Taking Active   pregabalin (LYRICA) 150 MG capsule [id number] Yes TAKE 1 CAPSULE BY MOUTH TWICE A DAY Carollee Herter, Yvonne R, DO Taking Active Self  psyllium (METAMUCIL) 58.6 % powder [id number] Yes Take 1 packet by mouth daily as needed (for constipation- mix and drink). [provider] Taking Active Self  senna (SENOKOT) 8.6 MG TABS tablet [id number] Yes Take 2 tablets (17.2 mg total) by mouth at bedtime. This is for constipation.  Stop taking if you start having diarrhea.  Patient taking differently: Take 2 tablets by mouth daily as needed for mild constipation. This is for constipation.  Stop taking if you start having diarrhea.    Modena Jansky, MD Taking Active Self  torsemide (DEMADEX) 20 MG tablet [id number] Yes Take 2 tablets (40 mg total) by mouth in the morning. Do not take on 01/24/2022. Minus Breeding, MD Taking Active Self  Med List Note Henderson Newcomer, CPhT 06/10/13 [id number]): cpap machine 2 L of oxygen at night            Patient Active Problem List   Diagnosis Date Noted   Simple chronic bronchitis ([place]) 06/03/2022   Cellulitis of lower extremity 04/30/2022   Type 2 DM with CKD stage 3 and hypertension ([place]) 04/30/2022   Cellulitis of right leg 04/24/2022   Stage 3b chronic kidney disease (CKD) ([place]) 04/24/2022   Lactic acidosis 04/24/2022   Left upper quadrant pain 04/08/2022   Subacute cough 04/08/2022   Chronic combined systolic and diastolic heart failure ([place]) 02/06/2022   Acute exacerbation of CHF (congestive heart failure) ([place]) 01/16/2022   Hemoptysis 12/26/2021   Subclavian vein stenosis 12/23/2021   Strep throat 11/16/2021   Multiple superficial wounds with infection 01/04/2021   Cardiac pacemaker in situ 11/20/2020   S/P CABG x 4 11/20/2020   Ascending aortic aneurysm ([place]) 11/20/2020   Senile purpura ([place]) 07/27/2020   Porokeratosis 06/20/2020   Pain due to onychomycosis of toenails of both feet 12/14/2019   Educated about COVID-19 virus infection 11/25/2019   DM2 (diabetes mellitus, type 2) ([place]) 08/10/2019   Hyperlipidemia associated with type 2 diabetes mellitus ([place]) 08/10/2019   S/P left TKA 06/29/2019   Peripheral neuropathy 07/27/2018   Uncontrolled type 2 diabetes mellitus with hyperglycemia ([place]) 05/13/2018   Diabetic peripheral neuropathy associated with type 2 diabetes mellitus ([place]) 05/13/2018   Pansinusitis 05/13/2018   Severe aortic stenosis    Complete heart block (HCC)    Paroxysmal atrial fibrillation (HCC)    Acute on chronic diastolic CHF (congestive heart failure), NYHA class 2 (HCC)    Left arm swelling    Status post transcatheter aortic valve  replacement (TAVR) using bioprosthesis 09/09/2017   Demand ischemia of myocardium    Typical atrial flutter (HCC)    Acute on chronic diastolic heart failure ([place])    GIB (gastrointestinal bleeding) 08/16/2017   Hyperlipidemia 08/05/2017   NSTEMI (non-ST elevated myocardial infarction) ([place])    Aortic stenosis, severe 07/25/2017   Pancytopenia ([place]) 07/24/2017   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 ([place]) 02/27/2017  Bradycardia 01/04/2017   Coronary artery disease 01/04/2017   Stenosis of carotid artery 01/04/2017   Obesity 09/18/2016   Wenckebach block    Disorder associated with type II diabetes mellitus ([place]) 05/21/2013   Spinal stenosis of lumbar region 04/08/2013   Anemia, iron deficiency 11/29/2011   B12 deficiency anemia 07/30/2011   OSA (obstructive sleep apnea) 07/06/2010   CAD, ARTERY BYPASS GRAFT 08/22/2009   Essential hypertension 03/29/2009   Bilateral lower extremity edema 02/21/2009   Hyperlipidemia LDL goal <70 11/26/2008   GOUT 08/17/2006     Medication Assistance:   High med cost when in coverage gap. Screened for medication assistance program in past - patient did not qualify. [employer] for hypercholesterolemia. Approved for $2500 thru 06/03/2023   Assessment / Plan: Type 2 DM - not at goal Continue Trulicity [id number] weekly, glimepiride 19m daily and Farxiga 129mdaily.  Continue to check blood glucose once a day Placed future order for A1c for 08/28/2022.   Medication management:  Reviewed and updated medication list Reviewed refill history and adherence  Follow Up:  Telephone follow up appointment with care management team member scheduled for:  2 to 3 months   TaCherre RobinsPharmD Clinical Pharmacist [place] [phone]

## 2022-08-16 DIAGNOSIS — M5136 Other intervertebral disc degeneration, lumbar region: Secondary | ICD-10-CM | POA: Diagnosis not present

## 2022-08-20 ENCOUNTER — Other Ambulatory Visit: Payer: Self-pay | Admitting: Family Medicine

## 2022-08-20 ENCOUNTER — Ambulatory Visit: Payer: Medicare Other

## 2022-08-20 DIAGNOSIS — E781 Pure hyperglyceridemia: Secondary | ICD-10-CM

## 2022-08-21 ENCOUNTER — Other Ambulatory Visit: Payer: Self-pay | Admitting: Family Medicine

## 2022-08-21 DIAGNOSIS — G609 Hereditary and idiopathic neuropathy, unspecified: Secondary | ICD-10-CM

## 2022-08-22 ENCOUNTER — Other Ambulatory Visit: Payer: Self-pay | Admitting: Cardiology

## 2022-08-22 ENCOUNTER — Other Ambulatory Visit: Payer: Self-pay | Admitting: Family Medicine

## 2022-08-22 DIAGNOSIS — G609 Hereditary and idiopathic neuropathy, unspecified: Secondary | ICD-10-CM

## 2022-08-22 MED ORDER — PREGABALIN 150 MG PO CAPS: 150.0000 mg | ORAL_CAPSULE | Freq: Two times a day (BID) | ORAL | 1 refills | Status: DC

## 2022-08-22 NOTE — Telephone Encounter (Signed)
Requesting: Lyrica Contract:  UDS: Last OV: 07/22/2022 Next OV: 10/14/22 w/ Tammy Last Refill: 02/25/2022, #180--1 RF Database:   Please advise

## 2022-08-22 NOTE — Telephone Encounter (Signed)
Prescription Request  08/22/2022  Is this a "Controlled Substance" medicine? No  LOV: 07/22/2022  What is the name of the medication or equipment?  pregabalin (LYRICA) capsule 150 mg  Have you contacted your pharmacy to request a refill? Yes   Which pharmacy would you like this sent to?  [facility] [place] [address] [place] [address] Phone: [phone] Fax: [phone]    Patient notified that their request is being sent to the clinical staff for review and that they should receive a response within 2 business days.   Please advise at Mobile [phone] (mobile)

## 2022-08-26 ENCOUNTER — Telehealth: Payer: Self-pay | Admitting: Family Medicine

## 2022-08-26 DIAGNOSIS — Z45018 Encounter for adjustment and management of other part of cardiac pacemaker: Secondary | ICD-10-CM | POA: Diagnosis not present

## 2022-08-26 DIAGNOSIS — Z950 Presence of cardiac pacemaker: Secondary | ICD-10-CM | POA: Diagnosis not present

## 2022-08-26 DIAGNOSIS — I442 Atrioventricular block, complete: Secondary | ICD-10-CM | POA: Diagnosis not present

## 2022-08-26 NOTE — Telephone Encounter (Signed)
Patient requested a call back from [facility] regarding his felicity medication. He states he is still having issues with it. Please advice.

## 2022-08-27 ENCOUNTER — Other Ambulatory Visit (HOSPITAL_BASED_OUTPATIENT_CLINIC_OR_DEPARTMENT_OTHER): Payer: Self-pay

## 2022-08-27 MED ORDER — TRULICITY 3 MG/0.5ML ~~LOC~~ SOAJ
3.0000 mg | SUBCUTANEOUS | 1 refills | Status: DC
  Filled 2022-08-27: qty 2, 28d supply, fill #0
  Filled 2022-09-25: qty 2, 28d supply, fill #1

## 2022-08-27 NOTE — Telephone Encounter (Signed)
Not use how patient received a text unless his phone creates texts from voicemails.  Spoke with patient and he would like to try '3mg'$  of Trulicity. Verified that [facility] has in stock. Patient will pick up when he is in the building tomorrow.

## 2022-08-27 NOTE — Telephone Encounter (Signed)
Pt stated he received a text from [place], but he does not text. He asked if she could call him and they could discuss this instead.

## 2022-08-27 NOTE — Telephone Encounter (Signed)
Pt called back to follow up with Tammy. Advised pt a message would be sent back to see about giving him a call regarding his trulicity.

## 2022-08-27 NOTE — Telephone Encounter (Signed)
Patient is unable to get Trulicity from his usual pharmacy -on backorder. Checked with [facility] in [facility] and they also do no have in stock.  Checked with [facility] in [place]. [facility] had Trulicity 1.'5mg'$  in stock / currently on backorder. Contacted [employer], [employer]. Suggested [facility], mail order or [facility].  Patient's next dose is tomorrow so mail order would not arrive in time. [facility] is showing Out of Stock for all strengths of Trulicity.   I was able to find the 0.'75mg'$  weekly strength or the '3mg'$  weekly strength. We can certainly titrate up to the higher strength or go back to the 0.'75mg'$  strength.  LM on patient's voicemail with information and CB # [phone]

## 2022-08-28 ENCOUNTER — Other Ambulatory Visit: Payer: Self-pay | Admitting: Family Medicine

## 2022-08-28 ENCOUNTER — Inpatient Hospital Stay (HOSPITAL_BASED_OUTPATIENT_CLINIC_OR_DEPARTMENT_OTHER): Payer: Medicare Other | Admitting: Family

## 2022-08-28 ENCOUNTER — Inpatient Hospital Stay: Payer: Medicare Other | Attending: Hematology & Oncology

## 2022-08-28 ENCOUNTER — Inpatient Hospital Stay: Payer: Medicare Other

## 2022-08-28 ENCOUNTER — Encounter: Payer: Self-pay | Admitting: Family

## 2022-08-28 VITALS — BP 119/72 | HR 69 | Temp 98.3°F | Resp 18 | Wt 237.0 lb

## 2022-08-28 DIAGNOSIS — D509 Iron deficiency anemia, unspecified: Secondary | ICD-10-CM | POA: Diagnosis not present

## 2022-08-28 DIAGNOSIS — I129 Hypertensive chronic kidney disease with stage 1 through stage 4 chronic kidney disease, or unspecified chronic kidney disease: Secondary | ICD-10-CM | POA: Diagnosis not present

## 2022-08-28 DIAGNOSIS — D510 Vitamin B12 deficiency anemia due to intrinsic factor deficiency: Secondary | ICD-10-CM | POA: Diagnosis not present

## 2022-08-28 DIAGNOSIS — C8332 Diffuse large B-cell lymphoma, intrathoracic lymph nodes: Secondary | ICD-10-CM

## 2022-08-28 DIAGNOSIS — D519 Vitamin B12 deficiency anemia, unspecified: Secondary | ICD-10-CM

## 2022-08-28 DIAGNOSIS — N183 Chronic kidney disease, stage 3 unspecified: Secondary | ICD-10-CM | POA: Diagnosis not present

## 2022-08-28 DIAGNOSIS — E1122 Type 2 diabetes mellitus with diabetic chronic kidney disease: Secondary | ICD-10-CM | POA: Diagnosis not present

## 2022-08-28 DIAGNOSIS — G629 Polyneuropathy, unspecified: Secondary | ICD-10-CM | POA: Diagnosis not present

## 2022-08-28 DIAGNOSIS — Z79899 Other long term (current) drug therapy: Secondary | ICD-10-CM | POA: Diagnosis not present

## 2022-08-28 DIAGNOSIS — D518 Other vitamin B12 deficiency anemias: Secondary | ICD-10-CM

## 2022-08-28 LAB — CBC WITH DIFFERENTIAL (CANCER CENTER ONLY)
Abs Immature Granulocytes: 0.29 10*3/uL — ABNORMAL HIGH (ref 0.00–0.07)
Basophils Absolute: 0 10*3/uL (ref 0.0–0.1)
Basophils Relative: 0 %
Eosinophils Absolute: 0 10*3/uL (ref 0.0–0.5)
Eosinophils Relative: 0 %
HCT: 44.9 % (ref 39.0–52.0)
Hemoglobin: 14.3 g/dL (ref 13.0–17.0)
Immature Granulocytes: 2 %
Lymphocytes Relative: 10 %
Lymphs Abs: 1.2 10*3/uL (ref 0.7–4.0)
MCH: 28.9 pg (ref 26.0–34.0)
MCHC: 31.8 g/dL (ref 30.0–36.0)
MCV: 90.9 fL (ref 80.0–100.0)
Monocytes Absolute: 0.5 10*3/uL (ref 0.1–1.0)
Monocytes Relative: 4 %
Neutro Abs: 10.4 10*3/uL — ABNORMAL HIGH (ref 1.7–7.7)
Neutrophils Relative %: 84 %
Platelet Count: 188 10*3/uL (ref 150–400)
RBC: 4.94 MIL/uL (ref 4.22–5.81)
RDW: 16.2 % — ABNORMAL HIGH (ref 11.5–15.5)
WBC Count: 12.5 10*3/uL — ABNORMAL HIGH (ref 4.0–10.5)
nRBC: 0 % (ref 0.0–0.2)

## 2022-08-28 LAB — CMP (CANCER CENTER ONLY)
ALT: 27 U/L (ref 0–44)
AST: 19 U/L (ref 15–41)
Albumin: 4.5 g/dL (ref 3.5–5.0)
Alkaline Phosphatase: 60 U/L (ref 38–126)
Anion gap: 11 (ref 5–15)
BUN: 65 mg/dL — ABNORMAL HIGH (ref 8–23)
CO2: 28 mmol/L (ref 22–32)
Calcium: 10.1 mg/dL (ref 8.9–10.3)
Chloride: 98 mmol/L (ref 98–111)
Creatinine: 2.17 mg/dL — ABNORMAL HIGH (ref 0.61–1.24)
GFR, Estimated: 29 mL/min — ABNORMAL LOW (ref >60)
Glucose, Bld: 343 mg/dL — ABNORMAL HIGH (ref 70–99)
Potassium: 4.9 mmol/L (ref 3.5–5.1)
Sodium: 137 mmol/L (ref 135–145)
Total Bilirubin: 0.6 mg/dL (ref 0.3–1.2)
Total Protein: 6.4 g/dL — ABNORMAL LOW (ref 6.5–8.1)

## 2022-08-28 LAB — RETICULOCYTES
Immature Retic Fract: 13.6 % (ref 2.3–15.9)
RBC.: 4.96 MIL/uL (ref 4.22–5.81)
Retic Count, Absolute: 90.8 10*3/uL (ref 19.0–186.0)
Retic Ct Pct: 1.8 % (ref 0.4–3.1)

## 2022-08-28 LAB — LACTATE DEHYDROGENASE: LDH: 190 U/L (ref 98–192)

## 2022-08-28 LAB — FERRITIN: Ferritin: 1391 ng/mL — ABNORMAL HIGH (ref 24–336)

## 2022-08-28 LAB — VITAMIN B12: Vitamin B-12: 198 pg/mL (ref 180–914)

## 2022-08-28 MED ORDER — CYANOCOBALAMIN 1000 MCG/ML IJ SOLN
1000.0000 ug | Freq: Once | INTRAMUSCULAR | Status: AC
  Administered 2022-08-28: 1000 ug via INTRAMUSCULAR
  Filled 2022-08-28: qty 1

## 2022-08-28 NOTE — Progress Notes (Signed)
Hematology and Oncology Follow Up Visit  Richard [name] [id number] 25-Jul-1940 82 y.o. 08/28/2022   Principle Diagnosis:  Diffuse large cell non-Hodgkin's lymphoma (IPI = 3) - NOT "double hit" Pernicious anemia Iron deficiency secondary to bleeding   Past Therapy: R-CHOP - s/p cycle 8 - completed 08/2017   Current Therapy:        Vitamin B12 1 mg IM every month Xgeva 120 mg subcu q 3 months - next dose due in 07/2022 IV iron as indicated   Interim History:  Richard [name] is here today for follow-up and B 12 injection. He is doing quite well and has no complaints at this time.  He played golf today and is enjoying getting out and staying active.  Blood sugars have been elevated and he will be starting Trulicity later today once he picks up form the pharmacy downstairs.   No issue with fever, chills, n/v, cough, rash, dizziness, SOB, chest pain, palpitations, abdominal pain or changes in bowel or bladder habits.  No bleeding, bruising or petechiae noted.  No swelling, tenderness, numbness or tingling in his extremities.  No falls or syncope reported.  Appetite and hydration are good. Weight is stable at 237 lbs.   ECOG Performance Status: 1 - Symptomatic but completely ambulatory  Medications:  Allergies as of 08/28/2022       Reactions   Ace Inhibitors Swelling, Other (See Comments)   Angioedema   Benazepril Swelling, Other (See Comments)   Angioedema; he is not a candidate for any angiotensin receptor blockers because of this significant allergic reaction. Because of a history of documented adverse serious drug reaction;Medi Alert bracelet  is recommended   Entresto [sacubitril-valsartan] Swelling, Other (See Comments)   First-in-Class Angiotensin Receptor Neprilysin Inhibitor- Med was "red-flagged" by the patient's pharmacy for him to NOT take!   Hctz [hydrochlorothiazide] Anaphylaxis, Swelling   Tongue and lip swelling   Aspirin Other (See Comments)   Gastritis, can aspirin  not take 325 mg aspirin        Medication List        Accurate as of August 28, 2022  2:09 PM. If you have any questions, ask your nurse or doctor.          acetaminophen 325 MG tablet Commonly known as: TYLENOL Take 650 mg by mouth at bedtime as needed for moderate pain or headache.   allopurinol 300 MG tablet Commonly known as: ZYLOPRIM Take 450 mg by mouth daily.   aluminum hydroxide-magnesium carbonate 95-358 MG/15ML Susp Commonly known as: GAVISCON Take 15 mLs by mouth as needed for indigestion or heartburn.   apixaban 2.5 MG Tabs tablet Commonly known as: ELIQUIS Take 1 tablet (2.5 mg total) by mouth 2 (two) times daily.   atorvastatin 80 MG tablet Commonly known as: LIPITOR TAKE 1 TABLET BY MOUTH EVERYDAY AT BEDTIME What changed: See the new instructions.   Azelastine HCl 137 MCG/SPRAY Soln PLACE 2 SPRAYS INTO BOTH NOSTRILS AT BEDTIME AS NEEDED FOR RHINITIS OR ALLERGIES.   CO Q-10 PO Take 1 capsule by mouth daily.   esomeprazole 40 MG capsule Commonly known as: NexIUM Take 1 capsule (40 mg total) by mouth daily. What changed: when to take this   ezetimibe 10 MG tablet Commonly known as: ZETIA TAKE 1 TABLET BY MOUTH EVERY DAY   Farxiga 10 MG Tabs tablet Generic drug: dapagliflozin propanediol Take 10 mg by mouth in the morning.   fenofibrate 160 MG tablet TAKE 1 TABLET BY MOUTH EVERY DAY  folic acid 1 MG tablet Commonly known as: FOLVITE TAKE 2 TABLETS BY MOUTH EVERY DAY   freestyle lancets USE ONCE A DAY TO CHECK BLOOD SUGAR.   FREESTYLE LITE test strip Generic drug: glucose blood USE TO TEST BLOOD SUGAR ONCE A DAY. DX CODE: E11.9   glimepiride 2 MG tablet Commonly known as: AMARYL TAKE 1 TABLET (2 MG TOTAL) BY MOUTH IN THE MORNING AND AT BEDTIME.   ICY HOT ADVANCED PAIN RELIEF EX Apply 1 application  topically daily as needed (to painful sites).   metoprolol succinate 25 MG 24 hr tablet Commonly known as: TOPROL-XL Take 1 tablet  (25 mg total) by mouth daily.   nitroGLYCERIN 0.4 MG SL tablet Commonly known as: NITROSTAT PLACE 1 TABLET UNDER THE TONGUE EVERY 5 MINUTES AS NEEDED FOR CHEST PAIN.   potassium chloride SA 20 MEQ tablet Commonly known as: Klor-Con M20 Take 2 tablets (40 mEq total) by mouth daily.   pregabalin 150 MG capsule Commonly known as: LYRICA TAKE 1 CAPSULE BY MOUTH TWICE A DAY   pregabalin 150 MG capsule Commonly known as: LYRICA Take 1 capsule (150 mg total) by mouth 2 (two) times daily.   psyllium 58.6 % powder Commonly known as: METAMUCIL Take 1 packet by mouth daily as needed (for constipation- mix and drink).   senna 8.6 MG Tabs tablet Commonly known as: SENOKOT Take 2 tablets (17.2 mg total) by mouth at bedtime. This is for constipation.  Stop taking if you start having diarrhea. What changed:  when to take this reasons to take this   torsemide 20 MG tablet Commonly known as: DEMADEX Take 2 tablets (40 mg total) by mouth in the morning. Do not take on 01/24/2022.   Trulicity 3 0000000 Sopn Generic drug: Dulaglutide Inject 3 mg into the skin once a week.   Vascepa 1 g capsule Generic drug: icosapent Ethyl TAKE 2 CAPSULES BY MOUTH 2 TIMES DAILY.   VITAMIN C PO Take 1 tablet by mouth daily with breakfast.        Allergies:  Allergies  Allergen Reactions   Ace Inhibitors Swelling and Other (See Comments)    Angioedema    Benazepril Swelling and Other (See Comments)    Angioedema; he is not a candidate for any angiotensin receptor blockers because of this significant allergic reaction. Because of a history of documented adverse serious drug reaction;Medi Alert bracelet  is recommended   Entresto [Sacubitril-Valsartan] Swelling and Other (See Comments)    First-in-Class Angiotensin Receptor Neprilysin Inhibitor- Med was "red-flagged" by the patient's pharmacy for him to NOT take!   Hctz [Hydrochlorothiazide] Anaphylaxis and Swelling    Tongue and lip swelling     Aspirin Other (See Comments)    Gastritis, can aspirin not take 325 mg aspirin    Past Medical History, Surgical history, Social history, and Family History were reviewed and updated.  Review of Systems: All other 10 point review of systems is negative.   Physical Exam:  weight is 237 lb (107.5 kg). His oral temperature is 98.3 F (36.8 C). His blood pressure is 119/72 and his pulse is 69. His respiration is 18 and oxygen saturation is 95%.   Wt Readings from Last 3 Encounters:  08/28/22 237 lb (107.5 kg)  07/30/22 239 lb 1.9 oz (108.5 kg)  07/22/22 239 lb 12.8 oz (108.8 kg)    Ocular: Sclerae unicteric, pupils equal, round and reactive to light Ear-nose-throat: Oropharynx clear, dentition fair Lymphatic: No cervical or supraclavicular adenopathy Lungs no  rales or rhonchi, good excursion bilaterally Heart regular rate and rhythm, no murmur appreciated Abd soft, nontender, positive bowel sounds MSK no focal spinal tenderness, no joint edema Neuro: non-focal, well-oriented, appropriate affect Breasts: Deferred   Lab Results  Component Value Date   WBC 12.5 (H) 08/28/2022   HGB 14.3 08/28/2022   HCT 44.9 08/28/2022   MCV 90.9 08/28/2022   PLT 188 08/28/2022   Lab Results  Component Value Date   FERRITIN 1,477 (H) 07/30/2022   IRON 95 07/30/2022   TIBC 361 07/30/2022   UIBC 266 07/30/2022   IRONPCTSAT 26 07/30/2022   Lab Results  Component Value Date   RETICCTPCT 1.8 08/28/2022   RBC 4.96 08/28/2022   RETICCTABS 52.1 11/22/2011   No results found for: "KPAFRELGTCHN", "LAMBDASER", "[facility]" Lab Results  Component Value Date   IGA 159 03/09/2012   Lab Results  Component Value Date   ALBUMINELP 4.2 07/27/2018   MSPIKE Not Observed 07/27/2018     Chemistry      Component Value Date/Time   NA 138 07/30/2022 1141   NA 138 02/08/2022 0811   NA 144 06/27/2017 0857   K 4.2 07/30/2022 1141   K 3.9 06/27/2017 0857   CL 99 07/30/2022 1141   CL 103 06/27/2017  0857   CO2 28 07/30/2022 1141   CO2 26 06/27/2017 0857   BUN 47 (H) 07/30/2022 1141   BUN 46 (H) 02/08/2022 0811   BUN 12 06/27/2017 0857   CREATININE 1.88 (H) 07/30/2022 1141   CREATININE 1.65 (H) 04/18/2020 0956      Component Value Date/Time   CALCIUM 10.4 (H) 07/30/2022 1141   CALCIUM 9.2 06/27/2017 0857   ALKPHOS 63 07/30/2022 1141   ALKPHOS 128 (H) 06/27/2017 0857   AST 26 07/30/2022 1141   ALT 27 07/30/2022 1141   ALT 24 06/27/2017 0857   BILITOT 0.7 07/30/2022 1141       Impression and Plan: Mr. Giesbrecht is a very pleasant 82 yo caucasian gentleman with diffuse large B-cell lymphoma (not "double hit" lymphoma). He completed treatment in February 2019.  Iron studies pending.  B 12 given today as planned.  Follow-up in 1 month.   Lottie Dawson, NP 2/28/20242:09 PM

## 2022-08-28 NOTE — Patient Instructions (Signed)
Vitamin B12 Deficiency Vitamin B12 deficiency means that your body does not have enough vitamin B12. The body needs this important vitamin: To make red blood cells. To make genes (DNA). To help the nerves work. If you do not have enough vitamin B12 in your body, you can have health problems, such as not having enough red blood cells in the blood (anemia). What are the causes? Not eating enough foods that contain vitamin B12. Not being able to take in (absorb) vitamin B12 from the food that you eat. Certain diseases. A condition in which the body does not make enough of a certain protein. This results in your body not taking in enough vitamin B12. Having a surgery in which part of the stomach or small intestine is taken out. Taking medicines that make it hard for the body to take in vitamin B12. These include: Heartburn medicines. Some medicines that are used to treat diabetes. What increases the risk? Being an older adult. Eating a vegetarian or vegan diet that does not include any foods that come from animals. Not eating enough foods that contain vitamin B12 while you are pregnant. Taking certain medicines. Having alcoholism. What are the signs or symptoms? In some cases, there are no symptoms. If the condition leads to too few blood cells or nerve damage, symptoms can occur, such as: Feeling weak or tired. Not being hungry. Losing feeling (numbness) or tingling in your hands and feet. Redness and burning of the tongue. Feeling sad (depressed). Confusion or memory problems. Trouble walking. If anemia is very bad, symptoms can include: Being short of breath. Being dizzy. Having a very fast heartbeat. How is this treated? Changing the way you eat and drink, such as: Eating more foods that contain vitamin B12. Drinking little or no alcohol. Getting vitamin B12 shots. Taking vitamin B12 supplements by mouth (orally). Your doctor will tell you the dose that is best for you. Follow  these instructions at home: Eating and drinking  Eat foods that come from animals and have a lot of vitamin B12 in them. These include: Meats and poultry. This includes beef, pork, chicken, turkey, and organ meats, such as liver. Seafood, such as clams, rainbow trout, salmon, tuna, and haddock. Eggs. Dairy foods such as milk, yogurt, and cheese. Eat breakfast cereals that have vitamin B12 added to them (are fortified). Check the label. The items listed above may not be a complete list of foods and beverages you can eat and drink. Contact a dietitian for more information. Alcohol use Do not drink alcohol if: Your doctor tells you not to drink. You are pregnant, may be pregnant, or are planning to become pregnant. If you drink alcohol: Limit how much you have to: 0-1 drink a day for women. 0-2 drinks a day for men. Know how much alcohol is in your drink. In the [place] one drink equals one 12 oz bottle of beer (355 mL), one 5 oz glass of wine (148 mL), or one 1 oz glass of hard liquor (44 mL). General instructions Get any vitamin B12 shots if told by your doctor. Take supplements only as told by your doctor. Follow the directions. Keep all follow-up visits. Contact a doctor if: Your symptoms come back. Your symptoms get worse or do not get better with treatment. Get help right away if: You have trouble breathing. You have a very fast heartbeat. You have chest pain. You get dizzy. You faint. These symptoms may be an emergency. Get help right away. Call 911.   Do not wait to see if the symptoms will go away. Do not drive yourself to the hospital. Summary Vitamin B12 deficiency means that your body is not getting enough of the vitamin. In some cases, there are no symptoms of this condition. Treatment may include making a change in the way you eat and drink, getting shots, or taking supplements. Eat foods that have vitamin B12 in them. This information is not intended to replace advice  given to you by your health care provider. Make sure you discuss any questions you have with your health care provider. Document Revised: 02/09/2021 Document Reviewed: 02/09/2021 Elsevier Patient Education  2023 [employer]  

## 2022-08-29 DIAGNOSIS — S61012A Laceration without foreign body of left thumb without damage to nail, initial encounter: Secondary | ICD-10-CM | POA: Diagnosis not present

## 2022-08-29 LAB — IRON AND IRON BINDING CAPACITY (CC-WL,HP ONLY)
Iron: 113 ug/dL (ref 45–182)
Saturation Ratios: 29 % (ref 17.9–39.5)
TIBC: 391 ug/dL (ref 250–450)
UIBC: 278 ug/dL (ref 117–376)

## 2022-08-29 LAB — HGB A1C W/O EAG: Hgb A1c MFr Bld: 8.1 % — ABNORMAL HIGH (ref 4.8–5.6)

## 2022-08-30 ENCOUNTER — Telehealth: Payer: Self-pay | Admitting: Family Medicine

## 2022-08-30 NOTE — Telephone Encounter (Signed)
Copied from [place]. Topic: Medicare AWV >> Aug 30, 2022  9:41 AM Devoria Glassing wrote: Reason for CRM: Called patient to schedule Medicare Annual Wellness Visit (AWV). Left message for patient to call back and schedule Medicare Annual Wellness Visit (AWV).  Last date of AWV: 09/06/21  Please schedule an appointment at any time with [facility].  If any questions, please contact me.  Thank you ,  Sherol Dade; [place] Direct Dial: [phone]

## 2022-09-02 ENCOUNTER — Ambulatory Visit (HOSPITAL_BASED_OUTPATIENT_CLINIC_OR_DEPARTMENT_OTHER): Payer: Medicare Other | Admitting: Internal Medicine

## 2022-09-04 DIAGNOSIS — I442 Atrioventricular block, complete: Secondary | ICD-10-CM | POA: Diagnosis not present

## 2022-09-04 DIAGNOSIS — I5033 Acute on chronic diastolic (congestive) heart failure: Secondary | ICD-10-CM | POA: Diagnosis not present

## 2022-09-04 DIAGNOSIS — Z950 Presence of cardiac pacemaker: Secondary | ICD-10-CM | POA: Diagnosis not present

## 2022-09-05 ENCOUNTER — Encounter (HOSPITAL_BASED_OUTPATIENT_CLINIC_OR_DEPARTMENT_OTHER): Payer: Medicare Other | Attending: Internal Medicine | Admitting: Internal Medicine

## 2022-09-05 DIAGNOSIS — E1140 Type 2 diabetes mellitus with diabetic neuropathy, unspecified: Secondary | ICD-10-CM | POA: Insufficient documentation

## 2022-09-05 DIAGNOSIS — E1151 Type 2 diabetes mellitus with diabetic peripheral angiopathy without gangrene: Secondary | ICD-10-CM | POA: Diagnosis not present

## 2022-09-05 DIAGNOSIS — S81801A Unspecified open wound, right lower leg, initial encounter: Secondary | ICD-10-CM | POA: Diagnosis not present

## 2022-09-05 DIAGNOSIS — C8330 Diffuse large B-cell lymphoma, unspecified site: Secondary | ICD-10-CM | POA: Diagnosis not present

## 2022-09-05 DIAGNOSIS — I509 Heart failure, unspecified: Secondary | ICD-10-CM | POA: Insufficient documentation

## 2022-09-05 DIAGNOSIS — S81802A Unspecified open wound, left lower leg, initial encounter: Secondary | ICD-10-CM

## 2022-09-05 DIAGNOSIS — T798XXA Other early complications of trauma, initial encounter: Secondary | ICD-10-CM | POA: Diagnosis not present

## 2022-09-05 DIAGNOSIS — I110 Hypertensive heart disease with heart failure: Secondary | ICD-10-CM | POA: Diagnosis not present

## 2022-09-05 DIAGNOSIS — S61402A Unspecified open wound of left hand, initial encounter: Secondary | ICD-10-CM | POA: Insufficient documentation

## 2022-09-05 DIAGNOSIS — D649 Anemia, unspecified: Secondary | ICD-10-CM | POA: Diagnosis not present

## 2022-09-05 DIAGNOSIS — I2510 Atherosclerotic heart disease of native coronary artery without angina pectoris: Secondary | ICD-10-CM | POA: Insufficient documentation

## 2022-09-05 DIAGNOSIS — G4730 Sleep apnea, unspecified: Secondary | ICD-10-CM | POA: Insufficient documentation

## 2022-09-07 DIAGNOSIS — I499 Cardiac arrhythmia, unspecified: Secondary | ICD-10-CM | POA: Diagnosis not present

## 2022-09-07 NOTE — Progress Notes (Signed)
SULLY, BELLER ([id number]) [phone] [id number] Page 1 of 4 Visit Report for 09/05/2022 Abuse Risk Screen Details Patient Name: Date of Service: Richard [name] Richard L. 09/05/2022 8:45 A M Medical Record Number: [id number] Patient Account Number: [id number] Date of Birth/Sex: Treating RN: 08/27/1940 (82 y.o. Richard [name] Primary Care Toan Mort: Richard [name] Other Clinician: Referring Richard [name]: Treating Kimetha Trulson/Extender: Richard [name] in Treatment: 0 Abuse Risk Screen Items Answer ABUSE RISK SCREEN: Has anyone close to you tried to hurt or harm you recentlyo No Do you feel uncomfortable with anyone in your familyo No Has anyone forced you do things that you didnt want to doo No Electronic Signature(s) Signed: 09/05/2022 5:08:37 PM By: Richard Creamer RN, BSN Entered By: Richard [name] on 09/05/2022 08:17:26 -------------------------------------------------------------------------------- Activities of Daily Living Details Patient Name: Date of Service: Richard [name] Richard L. 09/05/2022 8:45 A M Medical Record Number: [id number] Patient Account Number: [id number] Date of Birth/Sex: Treating RN: May 25, 1941 (82 y.o. Richard [name] Primary Care Gazelle Towe: Richard [name] Other Clinician: Referring Janyth Riera: Treating Shalay Carder/Extender: Richard [name] in Treatment: 0 Activities of Daily Living Items Answer Activities of Daily Living (Please select one for each item) Drive Automobile Completely Able T Medications ake Completely Able Use T elephone Completely Able Care for Appearance Completely Able Use T oilet Completely Able Bath / Shower Completely Able Dress Self Completely Able Feed Self Completely Able Walk Completely Able Get In / Out Bed Completely Able Housework Completely Able Prepare Meals Completely [place] for Self Completely Able Electronic  Signature(s) Signed: 09/05/2022 5:08:37 PM By: Richard Creamer RN, BSN Entered By: Richard [name] on 09/05/2022 08:17:58 -------------------------------------------------------------------------------- Education Screening Details Patient Name: Date of Service: Richard [name], Richard [name] Richard L. 09/05/2022 8:45 A M Medical Record Number: [id number] Patient Account Number: [id number] Date of Birth/Sex: Treating RN: 05-02-1941 (82 y.o. Richard [name] Primary Care Rashaud Ybarbo: Richard [name] Other Clinician: Referring Kenard Morawski: Treating Broady Lafoy/Extender: Richard [name] in Treatment: 0 [name], [name] ([id number]) [id number] Page 2 of 4 Learning Preferences/Education Level/Primary Language Learning Preference: Explanation, Demonstration, Printed Material Highest Education Level: College or Above Preferred Language: Diplomatic Services operational officer Language Barrier: No Translator Needed: No Memory Deficit: No Emotional Barrier: No Cultural/Religious Beliefs Affecting Medical Care: No Physical Barrier Impaired Vision: No Impaired Hearing: No Decreased Hand dexterity: No Knowledge/Comprehension Knowledge Level: High Comprehension Level: High Ability to understand written instructions: High Ability to understand verbal instructions: High Motivation Anxiety Level: Calm Cooperation: Cooperative Education Importance: Acknowledges Need Interest in Health Problems: Asks Questions Perception: Coherent Willingness to Engage in Self-Management High Activities: Readiness to Engage in Self-Management High Activities: Electronic Signature(s) Signed: 09/05/2022 5:08:37 PM By: Richard Creamer RN, BSN Entered By: Richard [name] on 09/05/2022 08:18:38 -------------------------------------------------------------------------------- Fall Risk Assessment Details Patient Name: Date of Service: Richard [name], Richard [name] Richard L. 09/05/2022 8:45 A M Medical Record Number:  [id number] Patient Account Number: [id number] Date of Birth/Sex: Treating RN: February 19, 1941 (82 y.o. Richard [name] Primary Care Ersie Savino: Richard [name] Other Clinician: Referring Erron Wengert: Treating Britteny Fiebelkorn/Extender: Richard [name] in Treatment: 0 Fall Risk Assessment Items Have you had 2 or more falls in the last 12 monthso 0 No Have you had any fall that resulted in injury in the last 12 monthso 0 No FALLS RISK SCREEN History of falling - immediate or within 3 months 0 No Secondary diagnosis (Do you have 2 or more medical diagnoseso) 0 No Ambulatory aid None/bed rest/wheelchair/nurse 0 Yes Crutches/cane/walker 0  No Furniture 0 No Intravenous therapy Access/Saline/Heparin Lock 0 No Gait/Transferring Normal/ bed rest/ wheelchair 0 Yes Weak (short steps with or without shuffle, stooped but able to lift head while walking, may seek 0 No support from furniture) Impaired (short steps with shuffle, may have difficulty arising from chair, head down, impaired 0 No balance) Mental Status Oriented to own ability 0 Yes Overestimates or forgets limitations 0 No Risk Level: Low Risk Score: 0 Richard [name], Richard [name] ([id number]) [phone] [id number] Page 3 of 4 Electronic Signature(s) -------------------------------------------------------------------------------- Foot Assessment Details Patient Name: Date of Service: Richard [name] Richard L. 09/05/2022 8:45 A M Medical Record Number: [id number] Patient Account Number: [id number] Date of Birth/Sex: Treating RN: 1941-03-27 (82 y.o. Richard [name] Primary Care Mackie Holness: Richard [name] Other Clinician: Referring Emmabelle Fear: Treating Callie Bunyard/Extender: Richard [name] in Treatment: 0 Foot Assessment Items Site Locations + = Sensation present, - = Sensation absent, C = Callus, U = Ulcer R = Redness, W = Warmth, M = Maceration, PU = Pre-ulcerative lesion F = Fissure,  S = Swelling, D = Dryness Assessment Right: Left: Other Deformity: No No Prior Foot Ulcer: No No Prior Amputation: No No Charcot Joint: No No Ambulatory Status: Gait: Electronic Signature(s) Signed: 09/05/2022 5:08:37 PM By: Richard Creamer RN, BSN Entered By: Richard [name] on 09/05/2022 08:19:39 -------------------------------------------------------------------------------- Nutrition Risk Screening Details Patient Name: Date of Service: Richard [name] Richard L. 09/05/2022 8:45 A M Medical Record Number: [id number] Patient Account Number: [id number] Date of Birth/Sex: Treating RN: [birth year] (82 y.o. Richard [name] Primary Care Ondra Deboard: Richard [name] Other Clinician: Referring Jaymarion Trombly: Treating Faduma Cho/Extender: Richard [name] in Treatment: 0 Height (in): Weight (lbs): Body Mass Index (BMI): Richard [name], Richard [name] ([id number]) [phone] [id number] Page 4 of 4 Nutrition Risk Screening Items Score Screening NUTRITION RISK SCREEN: I have an illness or condition that made me change the kind and/or amount of food I eat 0 No I eat fewer than two meals per day 0 No I eat few fruits and vegetables, or milk products 0 No I have three or more drinks of beer, liquor or wine almost every day 0 No I have tooth or mouth problems that make it hard for me to eat 0 No I don't always have enough money to buy the food I need 0 No I eat alone most of the time 0 No I take three or more different prescribed or over-the-counter drugs a day 1 Yes Without wanting to, I have lost or gained 10 pounds in the last six months 0 No I am not always physically able to shop, cook and/or feed myself 0 No Nutrition Protocols Good Risk Protocol Moderate Risk Protocol High Risk Proctocol Risk Level: Good Risk Score: 1 Electronic Signature(s) Signed: 09/05/2022 5:08:37 PM By: Richard Creamer RN, BSN Entered By: Richard [name] on 09/05/2022 08:19:33

## 2022-09-07 NOTE — Progress Notes (Signed)
BERT, GETZ ([id number]) [id number] Page 1 of 11 Visit Report for 09/05/2022 Allergy List Details Patient Name: Date of Service: Richard [name] [name] 09/05/2022 8:45 A M Medical Record Number: [id number] Patient Account Number: [id number] Date of Birth/Sex: Treating RN: 12-Jul-1940 (82 y.o. [name] Primary Care Marja Adderley: Roma Schanz Other Clinician: Referring Audwin Semper: Treating Jamiesha Victoria/Extender: Tharon Aquas in Treatment: 0 Allergies Active Allergies benazepril hydrochlorothiazide aspirin lactose ACE Inhibitors Reaction: swelling Entresto Reaction: swelling Allergy Notes Electronic Signature(s) Signed: 09/05/2022 5:08:37 PM By: Sharyn Creamer RN, BSN Entered By: Sharyn Creamer on 09/05/2022 08:16:58 -------------------------------------------------------------------------------- Arrival Information Details Patient Name: Date of Service: Richard [name], Richard [name] [name] 09/05/2022 8:45 A M Medical Record Number: [id number] Patient Account Number: [id number] Date of Birth/Sex: Treating RN: 1941-05-04 (82 y.o. Richard [name] Primary Care Albena Comes: Roma Schanz Other Clinician: Referring Brinnley Lacap: Treating Jule Schlabach/Extender: Tharon Aquas in Treatment: 0 Visit Information Patient Arrived: Ambulatory Arrival Time: 08:14 Accompanied By: self Transfer Assistance: None Patient Identification Verified: Yes Secondary Verification Process Completed: Yes Patient Has Alerts: Yes Patient Alerts: Patient on Blood Thinner History Since Last Visit Added or deleted any medications: No Any new allergies or adverse reactions: No Had a fall or experienced change in activities of daily living that may affect risk of falls: No Signs or symptoms of abuse/neglect since last visito No Hospitalized since last visit: No Implantable device outside of the clinic excluding cellular tissue based products  placed in the center since last visit: No Has Dressing in Place as Prescribed: Yes Pain Present Now: No Electronic Signature(s) Signed: 09/05/2022 5:08:37 PM By: Sharyn Creamer RN, BSN Entered By: Sharyn Creamer on 09/05/2022 08:14:45 Clinic Level of Care Assessment Details -------------------------------------------------------------------------------- [name] ([id number]) [id number] Page 2 of 11 Patient Name: Date of Service: Richard [name] [name] 09/05/2022 8:45 A M Medical Record Number: [id number] Patient Account Number: [id number] Date of Birth/Sex: Treating RN: December 22, 1940 (82 y.o. Richard [name] Primary Care Izreal Kock: Roma Schanz Other Clinician: Referring Evey Mcmahan: Treating Keria Widrig/Extender: Tharon Aquas in Treatment: 0 Clinic Level of Care Assessment Items TOOL 3 Quantity Score X- 1 0 Use when EandM and Procedure is performed on FOLLOW-UP visit ASSESSMENTS - Nursing Assessment / Reassessment X- 1 10 Reassessment of Co-morbidities (includes updates in patient status) X- 1 5 Reassessment of Adherence to Treatment Plan ASSESSMENTS - Wound and Skin Assessment / Reassessment '[]'$  - Points for Wound Assessment can only be taken for a new wound of unknown or different etiology and a procedure is 0 NOT performed to that wound X- 1 5 Simple Wound Assessment / Reassessment - one wound '[]'$  - 0 Complex Wound Assessment / Reassessment - multiple wounds '[]'$  - 0 Dermatologic / Skin Assessment (not related to wound area) ASSESSMENTS - Focused Assessment '[]'$  - 0 Circumferential Edema Measurements - multi extremities '[]'$  - 0 Nutritional Assessment / Counseling / Intervention '[]'$  - 0 Lower Extremity Assessment (monofilament, tuning fork, pulses) '[]'$  - 0 Peripheral Arterial Disease Assessment (using hand held doppler) ASSESSMENTS - Ostomy and/or Continence Assessment and Care '[]'$  - 0 Incontinence Assessment and  Management '[]'$  - 0 Ostomy Care Assessment and Management (repouching, etc.) PROCESS - Coordination of Care '[]'$  - Points for Discharge Coordination can only be taken for a new wound of unknown or different etiology and a procedure 0 is NOT performed to that wound X- 1 15 Simple Patient / Family Education for ongoing care '[]'$  - 0 Complex (extensive) Patient / Family Education  for ongoing care X- 1 10 Staff obtains Consents, Records, T Results / Process Orders est '[]'$  - 0 Staff telephones HHA, Nursing Homes / Clarify orders / etc '[]'$  - 0 Routine Transfer to another Facility (non-emergent condition) '[]'$  - 0 Routine Hospital Admission (non-emergent condition) X- 1 15 New Admissions / Biomedical engineer / Ordering NPWT Apligraf, etc. , '[]'$  - 0 Emergency Hospital Admission (emergent condition) '[]'$  - 0 Simple Discharge Coordination '[]'$  - 0 Complex (extensive) Discharge Coordination PROCESS - Special Needs '[]'$  - 0 Pediatric / Minor Patient Management '[]'$  - 0 Isolation Patient Management '[]'$  - 0 Hearing / Language / Visual special needs '[]'$  - 0 Assessment of Community assistance (transportation, D/C planning, etc.) '[]'$  - 0 Additional assistance / Altered mentation '[]'$  - 0 Support Surface(s) Assessment (bed, cushion, seat, etc.) INTERVENTIONS - Wound Cleansing / Measurement '[]'$  - Points for Wound Cleaning / Measurement, Wound Dressing, Specimen Collection and Specimen taken to lab can only 0 be taken for a new wound of unknown or different etiology and a procedure is NOT performed to that wound TRISTIN, GOLDBERG ([id number]) [id number] Page 3 of 11 '[]'$  - 0 Simple Wound Cleansing - one wound X- 3 5 Complex Wound Cleansing - multiple wounds X- 1 5 Wound Imaging (photographs - any number of wounds) '[]'$  - 0 Wound Tracing (instead of photographs) '[]'$  - 0 Simple Wound Measurement - one wound X- 3 5 Complex Wound Measurement - multiple wounds INTERVENTIONS - Wound  Dressings X - Small Wound Dressing one or multiple wounds 3 10 '[]'$  - 0 Medium Wound Dressing one or multiple wounds '[]'$  - 0 Large Wound Dressing one or multiple wounds INTERVENTIONS - Miscellaneous '[]'$  - 0 External ear exam '[]'$  - 0 Specimen Collection (cultures, biopsies, blood, body fluids, etc.) '[]'$  - 0 Specimen(s) / Culture(s) sent or taken to Lab for analysis '[]'$  - 0 Patient Transfer (multiple staff / Civil Service fast streamer / Similar devices) '[]'$  - 0 Simple Staple / Suture removal (25 or less) '[]'$  - 0 Complex Staple / Suture removal (26 or more) '[]'$  - 0 Hypo / Hyperglycemic Management (close monitor of Blood Glucose) '[]'$  - 0 Ankle / Brachial Index (ABI) - do not check if billed separately X- 1 5 Vital Signs Has the patient been seen at the hospital within the last three years: Yes Total Score: 130 Level Of Care: New/Established - Level 4 Electronic Signature(s) Signed: 09/05/2022 5:08:37 PM By: Sharyn Creamer RN, BSN Entered By: Sharyn Creamer on 09/05/2022 10:41:24 -------------------------------------------------------------------------------- Encounter Discharge Information Details Patient Name: Date of Service: Richard [name], Richard [name] [name] 09/05/2022 8:45 A M Medical Record Number: [id number] Patient Account Number: [id number] Date of Birth/Sex: Treating RN: [birth year] (82 y.o. Richard [name] Primary Care Keyerra Lamere: Roma Schanz Other Clinician: Referring Ariza Evans: Treating Mycah Mcdougall/Extender: Tharon Aquas in Treatment: 0 Encounter Discharge Information Items Post Procedure Vitals Discharge Condition: Stable Temperature (F): 97.9 Ambulatory Status: Ambulatory Pulse (bpm): 73 Discharge Destination: Home Respiratory Rate (breaths/min): 18 Transportation: Private Auto Blood Pressure (mmHg): 127/69 Accompanied By: self Schedule Follow-up Appointment: Yes Clinical Summary of Care: Patient Declined Electronic Signature(s) Signed: 09/05/2022 5:08:37 PM By:  Sharyn Creamer RN, BSN Entered By: Sharyn Creamer on 09/05/2022 11:02:19 -------------------------------------------------------------------------------- Lower Extremity Assessment Details Patient Name: Date of Service: Richard [name], [place] L. 09/05/2022 8:45 A M Medical Record Number: [id number] Patient Account Number: [id number] ERBIE, MAROTTA ([id number]) [id number] Page 4 of 11 Date of Birth/Sex: Treating RN: 01-18-1941 (82 y.o. Richard [name] Primary Care Tressie Ragin: Other  Clinician: Roma Schanz Referring Tane Biegler: Treating Zyeir Dymek/Extender: Tharon Aquas in Treatment: 0 Electronic Signature(s) Signed: 09/05/2022 5:08:37 PM By: Sharyn Creamer RN, BSN Entered By: Sharyn Creamer on 09/05/2022 08:19:50 -------------------------------------------------------------------------------- Multi Wound Chart Details Patient Name: Date of Service: Richard [name], Richard [name] [name] 09/05/2022 8:45 A M Medical Record Number: [id number] Patient Account Number: [id number] Date of Birth/Sex: Treating RN: 04/03/1941 (82 y.o. M) Primary Care Kymere Fullington: Roma Schanz Other Clinician: Referring Kriss Ishler: Treating Ariel Dimitri/Extender: Tharon Aquas in Treatment: 0 Vital Signs Height(in): Pulse(bpm): 86 Weight(lbs): Blood Pressure(mmHg): 127/69 Body Mass Index(BMI): Temperature(F): 97.9 Respiratory Rate(breaths/min): 18 [10:Photos:] Left Hand - 1st Digit Left, Medial Knee Right, Medial Knee Wound Location: Laceration Laceration Laceration Wounding Event: Trauma, Other Skin T ear Skin T ear Primary Etiology: Cataracts, Anemia, Sleep Apnea, Cataracts, Anemia, Sleep Apnea, Cataracts, Anemia, Sleep Apnea, Comorbid History: Arrhythmia, Congestive Heart Failure, Arrhythmia, Congestive Heart Failure, Arrhythmia, Congestive Heart Failure, Coronary Artery Disease, Coronary Artery Disease, Coronary Artery  Disease, Hypertension, Type II Diabetes, Hypertension, Type II Diabetes, Hypertension, Type II Diabetes, Osteoarthritis, Neuropathy, Received Osteoarthritis, Neuropathy, Received Osteoarthritis, Neuropathy, Received Chemotherapy Chemotherapy Chemotherapy 08/29/2022 08/19/2022 08/19/2022 Date Acquired: 0 0 0 Weeks of Treatment: Open Open Open Wound Status: No No No Wound Recurrence: No Yes No Clustered Wound: N/A 2 N/A Clustered Quantity: 0.8x1x0.1 7.6x4x0.1 1x2x0.1 Measurements L x W x D (cm) 0.628 23.876 1.571 A (cm) : rea 0.063 2.388 0.157 Volume (cm) : Full Thickness Without Exposed Full Thickness Without Exposed Full Thickness Without Exposed Classification: Support Structures Support Structures Support Structures Medium Medium Medium Exudate A mount: Serosanguineous Serosanguineous Serosanguineous Exudate Type: red, brown red, brown red, brown Exudate Color: None Present (0%) Large (67-100%) Small (1-33%) Granulation A mount: N/A Red Red Granulation Quality: Large (67-100%) Small (1-33%) Large (67-100%) Necrotic A mount: Eschar, Adherent Slough Eschar, Adherent Slough Eschar, Adherent Slough Necrotic Tissue: Fat Layer (Subcutaneous Tissue): Yes Fat Layer (Subcutaneous Tissue): Yes Fat Layer (Subcutaneous Tissue): Yes Exposed Structures: Fascia: No Fascia: No Fascia: No Tendon: No Tendon: No Tendon: No Muscle: No Muscle: No Muscle: No Joint: No Joint: No Joint: No Bone: No Bone: No Bone: No None None None Epithelialization: Debridement - Selective/Open Wound Chemical/Enzymatic/Mechanical - Chemical/Enzymatic/Mechanical - Debridement: Selective/Open Wound Selective/Open Wound Pre-procedure Verification/Time Out 09:05 N/A N/A Taken: Lidocaine 4% Topical Solution Lidocaine 4% Topical Solution Lidocaine 4% Topical Solution Pain Control: Necrotic/Eschar Necrotic/Eschar N/A Tissue Debrided: ZLATAN, VOLKOV ([id number])  [id number] Page 5 of 11 Non-Viable Tissue Non-Viable Tissue N/A Level: 0.8 N/A N/A Debridement A (sq cm): rea Curette N/A N/A Instrument: Moderate Minimum Minimum Bleeding: Surgicel Pressure Pressure Hemostasis Achieved: 0 0 0 Procedural Pain: 0 0 0 Post Procedural Pain: Procedure was tolerated well Procedure was tolerated well Procedure was tolerated well Debridement Treatment Response: 0.8x1x0.1 7.6x4.7x0.1 1x2x0.1 Post Debridement Measurements L x W x D (cm) 0.063 2.805 0.157 Post Debridement Volume: (cm) Debridement N/A N/A Procedures Performed: Treatment Notes Electronic Signature(s) Signed: 09/05/2022 12:51:47 PM By: Kalman Shan DO Entered By: Kalman Shan on 09/05/2022 10:51:10 -------------------------------------------------------------------------------- Multi-Disciplinary Care Plan Details Patient Name: Date of Service: Richard [name], Richard [name] [name] 09/05/2022 8:45 A M Medical Record Number: [id number] Patient Account Number: [id number] Date of Birth/Sex: Treating RN: 09/11/1940 (82 y.o. Richard [name] Primary Care Daden Mahany: Roma Schanz Other Clinician: Referring Caryssa Elzey: Treating Robson Trickey/Extender: Tharon Aquas in Treatment: 0 Active Inactive Nutrition Nursing Diagnoses: Imbalanced nutrition Potential for alteratiion in Nutrition/Potential for imbalanced nutrition Goals: Patient/caregiver verbalizes understanding of need to maintain therapeutic glucose control  per primary care physician Date Initiated: 09/05/2022 Target Resolution Date: 10/03/2022 Goal Status: Active Interventions: Assess patient nutrition upon admission and as needed per policy Provide education on elevated blood sugars and impact on wound healing Notes: Wound/Skin Impairment Nursing Diagnoses: Impaired tissue integrity Knowledge deficit related to ulceration/compromised skin integrity Goals: Patient/caregiver will  verbalize understanding of skin care regimen Date Initiated: 09/05/2022 Target Resolution Date: 10/03/2022 Goal Status: Active Ulcer/skin breakdown will have a volume reduction of 30% by week 4 Date Initiated: 09/05/2022 Target Resolution Date: 10/03/2022 Goal Status: Active Interventions: Assess patient/caregiver ability to obtain necessary supplies Assess patient/caregiver ability to perform ulcer/skin care regimen upon admission and as needed Assess ulceration(s) every visit Provide education on ulcer and skin care Notes: Electronic Signature(s) RANKIN, NOVEMBER ([id number]) [id number] Page 6 of 11 Signed: 09/05/2022 5:08:37 PM By: Sharyn Creamer RN, BSN Entered By: Sharyn Creamer on 09/05/2022 09:02:26 -------------------------------------------------------------------------------- Pain Assessment Details Patient Name: Date of Service: Richard [name] [name] 09/05/2022 8:45 A M Medical Record Number: [id number] Patient Account Number: [id number] Date of Birth/Sex: Treating RN: 02-01-1941 (82 y.o. Richard [name] Primary Care Barbar Brede: Roma Schanz Other Clinician: Referring Euna Armon: Treating Rodric Punch/Extender: Tharon Aquas in Treatment: 0 Active Problems Location of Pain Severity and Description of Pain Patient Has Paino No Site Locations Pain Management and Medication Current Pain Management: Electronic Signature(s) Signed: 09/05/2022 5:08:37 PM By: Sharyn Creamer RN, BSN Entered By: Sharyn Creamer on 09/05/2022 08:38:31 -------------------------------------------------------------------------------- Patient/Caregiver Education Details Patient Name: Date of Service: Gavin Potters 3/7/2024andnbsp8:45 A M Medical Record Number: [id number] Patient Account Number: [id number] Date of Birth/Gender: Treating RN: [birth year] (82 y.o. Richard [name] Primary Care Physician: Roma Schanz Other Clinician: Referring  Physician: Treating Physician/Extender: Tharon Aquas in Treatment: 0 Education Assessment Education Provided To: Patient Education Topics Provided Nutrition: Methods: Explain/Verbal Responses: State content correctly Wound/Skin Impairment: Methods: Explain/Verbal Responses: State content correctly KEYWAN, REAU ([id number]) [id number] Page 7 of 11 Electronic Signature(s) Signed: 09/05/2022 5:08:37 PM By: Sharyn Creamer RN, BSN Entered By: Sharyn Creamer on 09/05/2022 09:02:55 -------------------------------------------------------------------------------- Wound Assessment Details Patient Name: Date of Service: Richard [name] [name] 09/05/2022 8:45 A M Medical Record Number: [id number] Patient Account Number: [id number] Date of Birth/Sex: Treating RN: [birth year] (82 y.o. Richard [name] Primary Care Ania Levay: Roma Schanz Other Clinician: Referring Zaedyn Covin: Treating Andora Krull/Extender: Tharon Aquas in Treatment: 0 Wound Status Wound Number: 10 Primary Trauma, Other Etiology: Wound Location: Left Hand - 1st Digit Wound Open Wounding Event: Laceration Status: Date Acquired: 08/29/2022 Comorbid Cataracts, Anemia, Sleep Apnea, Arrhythmia, Congestive Heart Weeks Of Treatment: 0 History: Failure, Coronary Artery Disease, Hypertension, Type II Diabetes, Clustered Wound: No Osteoarthritis, Neuropathy, Received Chemotherapy Photos Wound Measurements Length: (cm) 0.8 Width: (cm) 1 Depth: (cm) 0.1 Area: (cm) 0.628 Volume: (cm) 0.063 % Reduction in Area: % Reduction in Volume: Epithelialization: None Tunneling: No Undermining: No Wound Description Classification: Full Thickness Without Exposed Support Exudate Amount: Medium Exudate Type: Serosanguineous Exudate Color: red, brown Structures Foul Odor After Cleansing: No Slough/Fibrino Yes Wound Bed Granulation Amount: None Present  (0%) Exposed Structure Necrotic Amount: Large (67-100%) Fascia Exposed: No Necrotic Quality: Eschar, Adherent Slough Fat Layer (Subcutaneous Tissue) Exposed: Yes Tendon Exposed: No Muscle Exposed: No Joint Exposed: No Bone Exposed: No Periwound Skin Texture Texture Color No Abnormalities Noted: No No Abnormalities Noted: No Moisture No Abnormalities Noted: No Treatment Notes Wound #10 (Hand - 1st Digit) Wound Laterality: Left Cleanser CANTON, BAYLES ([id number]) [id number] Page  8 of 11 Soap and Water Discharge Instruction: May shower and wash wound with dial antibacterial soap and water prior to dressing change. Peri-Wound Care Topical Mupirocin Ointment Discharge Instruction: Apply Mupirocin (Bactroban) as instructed Primary Dressing Secondary Dressing T Non-adherent Dressing, 2x3 in elfa Discharge Instruction: Apply over primary dressing as directed. Secured With Conforming Stretch Gauze Bandage, Sterile 2x75 (in/in) Discharge Instruction: Secure with stretch gauze as directed. Paper Tape, 1x10 (in/yd) Discharge Instruction: Secure dressing with tape as directed. Compression Wrap Compression Stockings Add-Ons Electronic Signature(s) Signed: 09/05/2022 5:08:37 PM By: Sharyn Creamer RN, BSN Entered By: Sharyn Creamer on 09/05/2022 08:36:03 -------------------------------------------------------------------------------- Wound Assessment Details Patient Name: Date of Service: Richard [name] [name] 09/05/2022 8:45 A M Medical Record Number: [id number] Patient Account Number: [id number] Date of Birth/Sex: Treating RN: 04-23-1941 (82 y.o. Richard [name] Primary Care Darious Rehman: Roma Schanz Other Clinician: Referring Kaushik Maul: Treating Desiray Orchard/Extender: Tharon Aquas in Treatment: 0 Wound Status Wound Number: 8 Primary Skin T ear Etiology: Wound Location: Left, Medial Knee Wound Open Wounding Event:  Laceration Status: Date Acquired: 08/19/2022 Comorbid Cataracts, Anemia, Sleep Apnea, Arrhythmia, Congestive Heart Weeks Of Treatment: 0 History: Failure, Coronary Artery Disease, Hypertension, Type II Diabetes, Clustered Wound: Yes Osteoarthritis, Neuropathy, Received Chemotherapy Photos Wound Measurements Length: (cm) 7.6 Width: (cm) 4 Depth: (cm) 0.1 Clustered Quantity: 2 Area: (cm) 23.876 Volume: (cm) 2.388 % Reduction in Area: % Reduction in Volume: Epithelialization: None Tunneling: No Undermining: No Wound Description RAMAR, GRASS ([id number]) Classification: Full Thickness Without Exposed Support Structures Exudate Amount: Medium Exudate Type: Serosanguineous Exudate Color: red, brown [id number] Page 9 of 11 Foul Odor After Cleansing: No Slough/Fibrino Yes Wound Bed Granulation Amount: Large (67-100%) Exposed Structure Granulation Quality: Red Fascia Exposed: No Necrotic Amount: Small (1-33%) Fat Layer (Subcutaneous Tissue) Exposed: Yes Necrotic Quality: Eschar, Adherent Slough Tendon Exposed: No Muscle Exposed: No Joint Exposed: No Bone Exposed: No Periwound Skin Texture Texture Color No Abnormalities Noted: No No Abnormalities Noted: No Moisture No Abnormalities Noted: No Treatment Notes Wound #8 (Knee) Wound Laterality: Left, Medial Cleanser Soap and Water Discharge Instruction: May shower and wash wound with dial antibacterial soap and water prior to dressing change. Peri-Wound Care Topical Primary Dressing Xeroform Occlusive Gauze Dressing, 4x4 in Discharge Instruction: Apply to wound bed as instructed Secondary Dressing ALLEVYN Gentle Border, 4x4 (in/in) Discharge Instruction: Apply over primary dressing as directed. T Non-adherent Dressing, 2x3 in elfa Discharge Instruction: Apply over primary dressing as directed. Secured With Compression Wrap Compression Stockings Environmental education officer) Signed:  09/05/2022 5:08:37 PM By: Sharyn Creamer RN, BSN Entered By: Sharyn Creamer on 09/05/2022 09:04:48 -------------------------------------------------------------------------------- Wound Assessment Details Patient Name: Date of Service: Richard [name] [name] 09/05/2022 8:45 A M Medical Record Number: [id number] Patient Account Number: [id number] Date of Birth/Sex: Treating RN: [birth year] (82 y.o. Richard [name] Primary Care Carmelita Amparo: Roma Schanz Other Clinician: Referring Vannia Pola: Treating Asberry Lascola/Extender: Tharon Aquas in Treatment: 0 Wound Status Wound Number: 9 Primary Skin T ear Etiology: Wound Location: Right, Medial Knee Wound Open Wounding Event: Laceration Status: Date Acquired: 08/19/2022 Comorbid Cataracts, Anemia, Sleep Apnea, Arrhythmia, Congestive Heart Weeks Of Treatment: 0 History: Failure, Coronary Artery Disease, Hypertension, Type II Diabetes, Clustered Wound: No Osteoarthritis, Neuropathy, Received Chemotherapy MEKHAI, GAYMON ([id number]) [id number] Page 10 of 11 Photos Wound Measurements Length: (cm) 1 Width: (cm) 2 Depth: (cm) 0.1 Area: (cm) 1.571 Volume: (cm) 0.157 % Reduction in Area: % Reduction in Volume: Epithelialization: None Tunneling: No Undermining: No Wound Description Classification: Full  Thickness Without Exposed Suppor Exudate Amount: Medium Exudate Type: Serosanguineous Exudate Color: red, brown t Structures Foul Odor After Cleansing: No Slough/Fibrino Yes Wound Bed Granulation Amount: Small (1-33%) Exposed Structure Granulation Quality: Red Fascia Exposed: No Necrotic Amount: Large (67-100%) Fat Layer (Subcutaneous Tissue) Exposed: Yes Necrotic Quality: Eschar, Adherent Slough Tendon Exposed: No Muscle Exposed: No Joint Exposed: No Bone Exposed: No Periwound Skin Texture Texture Color No Abnormalities Noted: No No Abnormalities Noted: No Moisture No  Abnormalities Noted: No Treatment Notes Wound #9 (Knee) Wound Laterality: Right, Medial Cleanser Soap and Water Discharge Instruction: May shower and wash wound with dial antibacterial soap and water prior to dressing change. Peri-Wound Care Topical Primary Dressing Xeroform Occlusive Gauze Dressing, 4x4 in Discharge Instruction: Apply to wound bed as instructed Secondary Dressing ALLEVYN Gentle Border, 4x4 (in/in) Discharge Instruction: Apply over primary dressing as directed. T Non-adherent Dressing, 2x3 in elfa Discharge Instruction: Apply over primary dressing as directed. Secured With Compression Wrap Compression Stockings Environmental education officer) Signed: 09/05/2022 5:08:37 PM By: Sharyn Creamer RN, BSN SENCERE, VANNAME 09/05/2022 5:08:37 PM By: Sharyn Creamer RN, BSN Signed: Carlean Jews ([id number] Page 11 of 11 Entered By: Sharyn Creamer on 09/05/2022 08:34:33 -------------------------------------------------------------------------------- Vitals Details Patient Name: Date of Service: Richard [name] [name] 09/05/2022 8:45 A M Medical Record Number: [id number] Patient Account Number: [id number] Date of Birth/Sex: Treating RN: [birth year] (82 y.o. Richard [name] Primary Care Mariam Helbert: Roma Schanz Other Clinician: Referring Sherena Machorro: Treating Kelliann Pendergraph/Extender: Tharon Aquas in Treatment: 0 Vital Signs Time Taken: 08:14 Temperature (F): 97.9 Pulse (bpm): 73 Respiratory Rate (breaths/min): 18 Blood Pressure (mmHg): 127/69 Reference Range: 80 - 120 mg / dl Electronic Signature(s) Signed: 09/05/2022 5:08:37 PM By: Sharyn Creamer RN, BSN Entered By: Sharyn Creamer on 09/05/2022 08:16:09

## 2022-09-07 NOTE — Progress Notes (Signed)
ZACHERI, DALAL ([id number]) [id number] Page 1 of 11 Visit Report for 09/05/2022 Chief Complaint Document Details Patient Name: Date of Service: Richard Killings [name] 09/05/2022 8:45 A M Medical Record Number: [id number] Patient Account Number: [id number] Date of Birth/Sex: Treating RN: [birth year] (82 y.o. M) Primary Care Provider: Roma Schanz Other Clinician: Referring Provider: Treating Provider/Extender: Tharon Aquas in Treatment: 0 Information Obtained from: Patient Chief Complaint 01/12/2021; Wounds to his left upper extremity and bilateral lower extremities. 05/14/2022; bilateral lower extremity wounds, right plantar foot wound 09/05/2022; bilateral lower extremity wounds, left thumb wound Electronic Signature(s) Signed: 09/05/2022 12:51:47 PM By: Kalman Shan DO Entered By: Kalman Shan on 09/05/2022 10:51:34 -------------------------------------------------------------------------------- Debridement Details Patient Name: Date of Service: Richard [name], Richard Larsson [name] 09/05/2022 8:45 A M Medical Record Number: [id number] Patient Account Number: [id number] Date of Birth/Sex: Treating RN: 10/07/1940 (82 y.o. Mare Ferrari Primary Care Provider: Roma Schanz Other Clinician: Referring Provider: Treating Provider/Extender: Tharon Aquas in Treatment: 0 Debridement Performed for Assessment: Wound #10 Left Hand - 1st Digit Performed By: Physician Kalman Shan, DO Debridement Type: Debridement Level of Consciousness (Pre-procedure): Awake and Alert Pre-procedure Verification/Time Out Yes - 09:05 Taken: Start Time: 09:11 Pain Control: Lidocaine 4% T opical Solution T Area Debrided (L x W): otal 0.8 (cm) x 1 (cm) = 0.8 (cm) Tissue and other material debrided: Non-Viable, Eschar Level: Non-Viable Tissue Debridement Description: Selective/Open Wound Instrument: Curette Bleeding:  Moderate Hemostasis Achieved: Surgicel Procedural Pain: 0 Post Procedural Pain: 0 Response to Treatment: Procedure was tolerated well Level of Consciousness (Post- Awake and Alert procedure): Post Debridement Measurements of Total Wound Length: (cm) 0.8 Width: (cm) 1 Depth: (cm) 0.1 Volume: (cm) 0.063 Character of Wound/Ulcer Post Debridement: Improved Post Procedure Diagnosis Same as Pre-procedure Notes Scribed for Dr Heber Burwell by Sharyn Creamer, RN Electronic Signature(s) Signed: 09/05/2022 12:51:47 PM By: Kalman Shan DO Signed: 09/05/2022 5:08:37 PM By: Sharyn Creamer RN, BSN Kanitz, JIOVANNY 09/05/2022 5:08:37 PM By: Sharyn Creamer RN, BSN Signed: Carlean Jews ([id number]) [id number] Page 2 of 11 Entered By: Sharyn Creamer on 09/05/2022 09:15:32 -------------------------------------------------------------------------------- Debridement Details Patient Name: Date of Service: Richard Killings [name] 09/05/2022 8:45 A M Medical Record Number: [id number] Patient Account Number: [id number] Date of Birth/Sex: Treating RN: 1941/06/21 (82 y.o. Mare Ferrari Primary Care Provider: Roma Schanz Other Clinician: Referring Provider: Treating Provider/Extender: Tharon Aquas in Treatment: 0 Debridement Performed for Assessment: Wound #9 Right,Medial Knee Performed By: Clinician Sharyn Creamer, RN Debridement Type: Chemical/Enzymatic/Mechanical Agent Used: wound cleanser and gauze Level of Consciousness (Pre-procedure): Awake and Alert Pre-procedure Verification/Time Out No Taken: Pain Control: Lidocaine 4% Topical Solution Bleeding: Minimum Hemostasis Achieved: Pressure Procedural Pain: 0 Post Procedural Pain: 0 Response to Treatment: Procedure was tolerated well Level of Consciousness (Post- Awake and Alert procedure): Post Debridement Measurements of Total Wound Length: (cm) 1 Width: (cm) 2 Depth: (cm) 0.1 Volume: (cm)  0.157 Character of Wound/Ulcer Post Debridement: Stable Post Procedure Diagnosis Same as Pre-procedure Electronic Signature(s) Signed: 09/05/2022 4:58:12 PM By: Kalman Shan DO Signed: 09/05/2022 5:08:37 PM By: Sharyn Creamer RN, BSN Entered By: Sharyn Creamer on 09/05/2022 16:47:24 -------------------------------------------------------------------------------- Debridement Details Patient Name: Date of Service: Richard [name], Richard Larsson [name] 09/05/2022 8:45 A M Medical Record Number: [id number] Patient Account Number: [id number] Date of Birth/Sex: Treating RN: 26-Sep-1940 (82 y.o. Mare Ferrari Primary Care Provider: Roma Schanz Other Clinician: Referring Provider: Treating Provider/Extender: Tharon Aquas in Treatment: 0 Debridement Performed for Assessment: Wound #8  Left,Medial Knee Performed By: Clinician Sharyn Creamer, RN Debridement Type: Chemical/Enzymatic/Mechanical Agent Used: wound cleanser and gauze Level of Consciousness (Pre-procedure): Awake and Alert Pre-procedure Verification/Time Out No Taken: Pain Control: Lidocaine 4% Topical Solution Bleeding: Minimum Hemostasis Achieved: Pressure Procedural Pain: 0 Post Procedural Pain: 0 Response to Treatment: Procedure was tolerated well Level of Consciousness (Post- Awake and Alert procedure): Post Debridement Measurements of Total Wound Length: (cm) 7.6 Width: (cm) 4.7 Richard [name], Richard [name] ([id number]) [id number] Page 3 of 11 Depth: (cm) 0.1 Volume: (cm) 2.805 Character of Wound/Ulcer Post Debridement: Stable Post Procedure Diagnosis Same as Pre-procedure Electronic Signature(s) Signed: 09/05/2022 4:58:12 PM By: Kalman Shan DO Signed: 09/05/2022 5:08:37 PM By: Sharyn Creamer RN, BSN Entered By: Sharyn Creamer on 09/05/2022 16:48:45 -------------------------------------------------------------------------------- HPI Details Patient Name: Date of Service: Richard [name], Richard Larsson [name] 09/05/2022 8:45 A M Medical Record Number: [id number] Patient Account Number: [id number] Date of Birth/Sex: Treating RN: 06-10-1941 (82 y.o. M) Primary Care Provider: Roma Schanz Other Clinician: Referring Provider: Treating Provider/Extender: Tharon Aquas in Treatment: 0 History of Present Illness HPI Description: Admission 7/15 Mr. Aijalon Abbondanza is an 82 year old male with a past medical history of type 2 diabetes on oral agents, diffuse large B-cell lymphoma, and coronary artery disease status post CABG that presents to the clinic for wounds to his left upper extremity and bilateral lower extremities. He states that he went to urgent care to be evaluated for a fever. And he was instructed to go to the ED to be further evaluated for possible sepsis. Unfortunately he passed out in his car After his appointment and was not discovered for another 3 hours. He was eventually discovered and EMS had to take him out of his vehicle. This is when he developed abrasions to his left arm and legs. He has been using Xeroform to his wound beds every other day. He reports improvement to all the wounds except for the one on the back of his right leg. He denies pain or signs of infection. 7/22; patient presents for 1 week follow-up. He has no issues or complaints today. He has tolerated the compression wrap well on his right lower extremity. He has been able to do the dressing changes without issues to his left leg and left arm. His left arm wound has healed. He denies signs of infection. 7/29; patient presents for 1 week follow-up. He reports that his left leg wound is healed. He tolerated the compression wrap well on his right leg. He has no issues or complaints today. He denies signs of infection. 8/4; patient presents for 1 week follow-up. He continues to see improvement in wound healing to his right lower extremity. He has no issues or complaints today. He  denies signs of infection. He does state that he went to the dermatologist and had some lesions removed on his face and left leg. He has been keeping these covered with a Band-Aid. 8/19; patient presents for follow-up. He is tolerated the compression wrap well. He has no issues or complaints today. He now only has 1 remaining wound. 9/1; patient presents for 1 week follow-up. He has tolerated the compression wrap well and has no issues or complaints today. He has been golfing 3 times weekly. He is in good spirits today. 9/15; patient presents for follow-up. He has no issues or complaints today. The wound is closed. Readmission 05/14/2022 [name] is an 82 year old male with a past medical history of uncontrolled type 2 diabetes on oral agents, diffuse  large B-cell lymphoma and coronary artery disease status post CABG that presents the clinic for 3 wounds. He developed a right knee wound when he fell out of his golf cart 3 weeks ago. He developed increased warmth and erythema to the right leg. While he was still waiting in the ED on 04/24/2022 for this issue he dropped his phone and created a wound to his left thigh. He has been using Xeroform to these areas. He ultimately required admission from the ED for right lower extremity cellulitis and was treated with Zyvox and Keflex. He also has a right plantar foot wound that started after having callus debridement by his podiatrist. He has had this wound for at least 6 weeks. For the foot wound he has not been keeping it covered and walks around barefoot. He has peripheral neuropathy However states that the wound site is painful. He denies any purulent drainage. He has swelling to this area. 11/21; patient presents for follow-up. He had an x-ray done at last clinic visit to the right foot that showed no focal bony erosion suggesting osteomyelitis. He completed his course of levofloxacin. He has been using his front offloading shoe. He has been  using Xeroform to the knee wounds. He has no issues or complaints today. 11/28; patient presents for follow-up. His right knee wound has epithelialized. His right plantar foot wound remains closed. He has no issues or complaints today. 09/05/2022 [name] is an 82 year old male with a past medical history of type 2 diabetes on oral agents, diffuse large B-cell lymphoma and coronary artery disease status post CABG that presents the clinic for a 2-week history of wounds to his lower extremities bilaterally and his left thumb. He developed the wounds to his legs after his sister's dog jumped on him. He has been using Vaseline to the areas. He cut his thumb with a knife while cooking. He states he went to the ED and a used Dermabond on this. He currently denies signs of infection. Electronic Signature(s) Signed: 09/05/2022 12:51:47 PM By: Kalman Shan DO Entered By: Kalman Shan on 09/05/2022 10:53:09 [name] ([id number] Page 4 of 11 -------------------------------------------------------------------------------- Physical Exam Details Patient Name: Date of Service: Richard Killings [name] 09/05/2022 8:45 A M Medical Record Number: [id number] Patient Account Number: [id number] Date of Birth/Sex: Treating RN: 1940/09/26 (82 y.o. M) Primary Care Provider: Roma Schanz Other Clinician: Referring Provider: Treating Provider/Extender: Tharon Aquas in Treatment: 0 Constitutional respirations regular, non-labored and within target range for patient.. Cardiovascular 2+ dorsalis pedis/posterior tibialis pulses. Psychiatric pleasant and cooperative. Notes T the lower extremities bilaterally there are open wounds to the proximal medial aspect. The wound beds have granulation tissue throughout. T the left thumb o o there is nonviable tissue throughout the surface. Postdebridement there is granulation tissue  present. Electronic Signature(s) Signed: 09/05/2022 12:51:47 PM By: Kalman Shan DO Entered By: Kalman Shan on 09/05/2022 10:54:20 -------------------------------------------------------------------------------- Physician Orders Details Patient Name: Date of Service: Richard [name], Richard Larsson [name] 09/05/2022 8:45 A M Medical Record Number: [id number] Patient Account Number: [id number] Date of Birth/Sex: Treating RN: 09/19/1940 (82 y.o. Mare Ferrari Primary Care Provider: Roma Schanz Other Clinician: Referring Provider: Treating Provider/Extender: Tharon Aquas in Treatment: 0 Verbal / Phone Orders: No Diagnosis Coding Follow-up Appointments Return Appointment in 1 week. Anesthetic Wound #10 Left Hand - 1st Digit (In clinic) Topical Lidocaine 4% applied to wound bed Wound #8 Left,Medial Knee (In clinic) Topical Lidocaine 4% applied to  wound bed Wound #9 Right,Medial Knee (In clinic) Topical Lidocaine 4% applied to wound bed Bathing/ Shower/ Hygiene May shower and wash wound with soap and water. - Dial gold antibacterial soap Wound Treatment Wound #10 - Hand - 1st Digit Wound Laterality: Left Cleanser: Soap and Water 1 x Per Day/15 Days Discharge Instructions: May shower and wash wound with dial antibacterial soap and water prior to dressing change. Topical: Mupirocin Ointment 1 x Per Day/15 Days Discharge Instructions: Apply Mupirocin (Bactroban) as instructed Secondary Dressing: T Non-adherent Dressing, 2x3 in (Generic) 1 x Per Day/15 Days elfa Discharge Instructions: Apply over primary dressing as directed. Secured With: Child psychotherapist, Sterile 2x75 (in/in) 1 x Per Day/15 Days Discharge Instructions: Secure with stretch gauze as directed. Secured With: Paper Tape, 1x10 (in/yd) 1 x Per Day/15 Days Discharge Instructions: Secure dressing with tape as directed. REKO, TRAFICANTE ([id number])  [id number] Page 5 of 11 Wound #8 - Knee Wound Laterality: Left, Medial Cleanser: Soap and Water 1 x Per Day/15 Days Discharge Instructions: May shower and wash wound with dial antibacterial soap and water prior to dressing change. Prim Dressing: Xeroform Occlusive Gauze Dressing, 4x4 in (DME) (Generic) 1 x Per Day/15 Days ary Discharge Instructions: Apply to wound bed as instructed Secondary Dressing: ALLEVYN Gentle Border, 4x4 (in/in) (DME) (Generic) 1 x Per Day/15 Days Discharge Instructions: Apply over primary dressing as directed. Secondary Dressing: T Non-adherent Dressing, 2x3 in (DME) (Generic) 1 x Per Day/15 Days elfa Discharge Instructions: Apply over primary dressing as directed. Wound #9 - Knee Wound Laterality: Right, Medial Cleanser: Soap and Water 1 x Per [id number] Days Discharge Instructions: May shower and wash wound with dial antibacterial soap and water prior to dressing change. Prim Dressing: Xeroform Occlusive Gauze Dressing, 4x4 in (DME) (Generic) 1 x Per Day/15 Days ary Discharge Instructions: Apply to wound bed as instructed Secondary Dressing: ALLEVYN Gentle Border, 3x3 (in/in) (DME) (Generic) 1 x Per Day/15 Days Discharge Instructions: Apply over primary dressing as directed. Secondary Dressing: T Non-adherent Dressing, 2x3 in (DME) (Generic) 1 x Per Day/15 Days elfa Discharge Instructions: Apply over primary dressing as directed. Patient Medications llergies: benazepril, hydrochlorothiazide, aspirin, lactose, ACE Inhibitors, Entresto A Notifications Medication Indication Start End prior to debridement 09/05/2022 lidocaine DOSE topical 4 % cream - cream topical once daily 09/05/2022 mupirocin DOSE 1 - topical 2 % ointment - Apply once daily to the wound bed Electronic Signature(s) Signed: 09/05/2022 4:58:12 PM By: Kalman Shan DO Signed: 09/05/2022 5:08:37 PM By: Sharyn Creamer RN, BSN Previous Signature: 09/05/2022 12:51:47 PM Version  By: Kalman Shan DO Previous Signature: 09/05/2022 10:25:58 AM Version By: Kalman Shan DO Entered By: Sharyn Creamer on 09/05/2022 14:45:58 -------------------------------------------------------------------------------- Problem List Details Patient Name: Date of Service: Richard [name], Richard Larsson [name] 09/05/2022 8:45 A M Medical Record Number: [id number] Patient Account Number: [id number] Date of Birth/Sex: Treating RN: 07/23/1940 (82 y.o. M) Primary Care Provider: Roma Schanz Other Clinician: Referring Provider: Treating Provider/Extender: Tharon Aquas in Treatment: 0 Active Problems ICD-10 Encounter Code Description Active Date MDM Diagnosis S81.802A Unspecified open wound, left lower leg, initial encounter 09/05/2022 No Yes S81.801A Unspecified open wound, right lower leg, initial encounter 09/05/2022 No Yes S61.402A Unspecified open wound of left hand, initial encounter 09/05/2022 No Yes T79.8XXA Other early complications of trauma, initial encounter 09/05/2022 No Yes Richard [name], Richard [name] ([id number]) [id number] Page 6 of 11 Inactive Problems Resolved Problems Electronic Signature(s) Signed: 09/05/2022 12:51:47 PM By: Kalman Shan DO Entered By: Kalman Shan on 09/05/2022 10:51:04 --------------------------------------------------------------------------------  Progress Note Details Patient Name: Date of Service: Richard Killings [name] 09/05/2022 8:45 A M Medical Record Number: [id number] Patient Account Number: [id number] Date of Birth/Sex: Treating RN: 23-Aug-1940 (82 y.o. M) Primary Care Provider: Roma Schanz Other Clinician: Referring Provider: Treating Provider/Extender: Tharon Aquas in Treatment: 0 Subjective Chief Complaint Information obtained from Patient 01/12/2021; Wounds to his left upper extremity and bilateral lower extremities. 05/14/2022; bilateral lower extremity wounds,  right plantar foot wound 09/05/2022; bilateral lower extremity wounds, left thumb wound History of Present Illness (HPI) Admission 7/15 [name] is an 82 year old male with a past medical history of type 2 diabetes on oral agents, diffuse large B-cell lymphoma, and coronary artery disease status post CABG that presents to the clinic for wounds to his left upper extremity and bilateral lower extremities. He states that he went to urgent care to be evaluated for a fever. And he was instructed to go to the ED to be further evaluated for possible sepsis. Unfortunately he passed out in his car After his appointment and was not discovered for another 3 hours. He was eventually discovered and EMS had to take him out of his vehicle. This is when he developed abrasions to his left arm and legs. He has been using Xeroform to his wound beds every other day. He reports improvement to all the wounds except for the one on the back of his right leg. He denies pain or signs of infection. 7/22; patient presents for 1 week follow-up. He has no issues or complaints today. He has tolerated the compression wrap well on his right lower extremity. He has been able to do the dressing changes without issues to his left leg and left arm. His left arm wound has healed. He denies signs of infection. 7/29; patient presents for 1 week follow-up. He reports that his left leg wound is healed. He tolerated the compression wrap well on his right leg. He has no issues or complaints today. He denies signs of infection. 8/4; patient presents for 1 week follow-up. He continues to see improvement in wound healing to his right lower extremity. He has no issues or complaints today. He denies signs of infection. He does state that he went to the dermatologist and had some lesions removed on his face and left leg. He has been keeping these covered with a Band-Aid. 8/19; patient presents for follow-up. He is tolerated the compression  wrap well. He has no issues or complaints today. He now only has 1 remaining wound. 9/1; patient presents for 1 week follow-up. He has tolerated the compression wrap well and has no issues or complaints today. He has been golfing 3 times weekly. He is in good spirits today. 9/15; patient presents for follow-up. He has no issues or complaints today. The wound is closed. Readmission 05/14/2022 Mr. Catcher Perra is an 82 year old male with a past medical history of uncontrolled type 2 diabetes on oral agents, diffuse large B-cell lymphoma and coronary artery disease status post CABG that presents the clinic for 3 wounds. He developed a right knee wound when he fell out of his golf cart 3 weeks ago. He developed increased warmth and erythema to the right leg. While he was still waiting in the ED on 04/24/2022 for this issue he dropped his phone and created a wound to his left thigh. He has been using Xeroform to these areas. He ultimately required admission from the ED for right lower extremity cellulitis and was treated with  Zyvox and Keflex. He also has a right plantar foot wound that started after having callus debridement by his podiatrist. He has had this wound for at least 6 weeks. For the foot wound he has not been keeping it covered and walks around barefoot. He has peripheral neuropathy However states that the wound site is painful. He denies any purulent drainage. He has swelling to this area. 11/21; patient presents for follow-up. He had an x-ray done at last clinic visit to the right foot that showed no focal bony erosion suggesting osteomyelitis. He completed his course of levofloxacin. He has been using his front offloading shoe. He has been using Xeroform to the knee wounds. He has no issues or complaints today. 11/28; patient presents for follow-up. His right knee wound has epithelialized. His right plantar foot wound remains closed. He has no issues or complaints today. 09/05/2022 Mr.  Richard [name] is an 82 year old male with a past medical history of type 2 diabetes on oral agents, diffuse large B-cell lymphoma and coronary artery disease status post CABG that presents the clinic for a 2-week history of wounds to his lower extremities bilaterally and his left thumb. He developed the wounds to his legs after his sister's dog jumped on him. He has been using Vaseline to the areas. He cut his thumb with a knife while cooking. He states he went to the ED and a used Dermabond on this. He currently denies signs of infection. Patient History Information obtained from Patient. Richard [name], Richard [name] ([id number]) [id number] Page 7 of 11 Allergies benazepril, hydrochlorothiazide, aspirin, lactose, ACE Inhibitors (Reaction: swelling), Entresto (Reaction: swelling) Family History Cancer - Mother, Diabetes - Maternal Grandparents, Heart Disease - Paternal Grandparents, Hypertension - Paternal Grandparents, Seizures - Child, Stroke - Father, No family history of Hereditary Spherocytosis, Kidney Disease, Lung Disease, Thyroid Problems, Tuberculosis. Social History Never smoker, Marital Status - Divorced, Alcohol Use - Never, Drug Use - No History, Caffeine Use - Rarely. Medical History Eyes Patient has history of Cataracts - Surgery Hematologic/Lymphatic Patient has history of Anemia Respiratory Patient has history of Sleep Apnea Cardiovascular Patient has history of Arrhythmia, Congestive Heart Failure, Coronary Artery Disease, Hypertension Endocrine Patient has history of Type II Diabetes Musculoskeletal Patient has history of Osteoarthritis Neurologic Patient has history of Neuropathy Oncologic Patient has history of Received Chemotherapy Denies history of Received Radiation Hospitalization/Surgery History - cellulitis right leg 10/25-10/28/2023. - balloon angioplasty 03/2021. - 12/29/2021 CHF. Medical A Surgical History Notes nd Musculoskeletal Spinal  Stenosis Oncologic 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 Objective Constitutional respirations regular, non-labored and within target range for patient.. Vitals Time Taken: 8:14 AM, Temperature: 97.9 F, Pulse: 73 bpm, Respiratory Rate: 18 breaths/min, Blood Pressure: 127/69 mmHg. Cardiovascular 2+ dorsalis pedis/posterior tibialis pulses. Psychiatric pleasant and cooperative. General Notes: T the lower extremities bilaterally there are open wounds to the proximal medial aspect. The wound beds have granulation tissue throughout. T o o the left thumb there is nonviable tissue throughout the surface. Postdebridement there is granulation tissue present. Integumentary (Hair, Skin) Wound #10 status is Open. Original cause of wound was Laceration. The date acquired was: 08/29/2022. The wound is located on the Left Hand - 1st Digit. The wound measures 0.8cm length x 1cm width x 0.1cm depth; 0.628cm^2 area and 0.063cm^3 volume. There is Fat Layer (Subcutaneous Tissue) exposed. There is no tunneling or undermining noted. There is a medium amount of serosanguineous drainage noted. There is no granulation within the wound bed. There is a large (  67-100%) amount of necrotic tissue within the wound bed including Eschar and Adherent Slough. Wound #8 status is Open. Original cause of wound was Laceration. The date acquired was: 08/19/2022. The wound is located on the Left,Medial Knee. The wound measures 7.6cm length x 4cm width x 0.1cm depth; 23.876cm^2 area and 2.388cm^3 volume. There is Fat Layer (Subcutaneous Tissue) exposed. There is no tunneling or undermining noted. There is a medium amount of serosanguineous drainage noted. There is large (67-100%) red granulation within the wound bed. There is a small (1-33%) amount of necrotic tissue within the wound bed including Eschar and Adherent Slough. Wound #9 status is Open. Original cause of wound was Laceration. The date acquired was: 08/19/2022. The  wound is located on the Right,Medial Knee. The wound measures 1cm length x 2cm width x 0.1cm depth; 1.571cm^2 area and 0.157cm^3 volume. There is Fat Layer (Subcutaneous Tissue) exposed. There is no tunneling or undermining noted. There is a medium amount of serosanguineous drainage noted. There is small (1-33%) red granulation within the wound bed. There is a large (67-100%) amount of necrotic tissue within the wound bed including Eschar and Adherent Slough. Assessment Active Problems ICD-10 Richard [name], Richard [name] ([id number]) [id number] Page 8 of 11 Unspecified open wound, left lower leg, initial encounter Unspecified open wound, right lower leg, initial encounter Unspecified open wound of left hand, initial encounter Other early complications of trauma, initial encounter Patient presents with 3 wounds to located to his lower extremities bilaterally and to the left thumb caused by trauma. I debrided nonviable tissue to the left thumb and recommended mupirocin ointment here. T the lower extremities I recommended Xeroform. Follow-up in 1 week. o Procedures Wound #10 Pre-procedure diagnosis of Wound #10 is a Trauma, Other located on the Left Hand - 1st Digit . There was a Selective/Open Wound Non-Viable Tissue Debridement with a total area of 0.8 sq cm performed by Kalman Shan, DO. With the following instrument(s): Curette to remove Non-Viable tissue/material. Material removed includes Eschar after achieving pain control using Lidocaine 4% Topical Solution. No specimens were taken. A time out was conducted at 09:05, prior to the start of the procedure. A Moderate amount of bleeding was controlled with Surgicel. The procedure was tolerated well with a pain level of 0 throughout and a pain level of 0 following the procedure. Post Debridement Measurements: 0.8cm length x 1cm width x 0.1cm depth; 0.063cm^3 volume. Character of Wound/Ulcer Post Debridement is  improved. Post procedure Diagnosis Wound #10: Same as Pre-Procedure General Notes: Scribed for Dr Heber [place] by Sharyn Creamer, RN. Wound #8 Pre-procedure diagnosis of Wound #8 is a Skin T located on the Left,Medial Knee . There was a Selective/Open Wound Non-Viable Tissue ear Chemical/Enzymatic/Mechanical performed by Sharyn Creamer, RN. to remove Non-Viable tissue/material. Material removed includes Eschar after achieving pain control using Lidocaine 4% Topical Solution. Other agent used was wound cleanser and gauze. A Minimum amount of bleeding was controlled with Pressure. The procedure was tolerated well with a pain level of 0 throughout and a pain level of 0 following the procedure. Post Debridement Measurements: 7.6cm length x 4.7cm width x 0.1cm depth; 2.805cm^3 volume. Character of Wound/Ulcer Post Debridement is stable. Post procedure Diagnosis Wound #8: Same as Pre-Procedure Wound #9 Pre-procedure diagnosis of Wound #9 is a Skin T located on the Right,Medial Knee . There was a Chemical/Enzymatic/Mechanical debridement performed by ear Sharyn Creamer, RN. to remove Non-Viable tissue/material. after achieving pain control using Lidocaine 4% Topical Solution. Other agent used was wound cleanser and gauze.  A Minimum amount of bleeding was controlled with Pressure. The procedure was tolerated well with a pain level of 0 throughout and a pain level of 0 following the procedure. Post Debridement Measurements: 1cm length x 2cm width x 0.1cm depth; 0.157cm^3 volume. Character of Wound/Ulcer Post Debridement is stable. Post procedure Diagnosis Wound #9: Same as Pre-Procedure Plan Follow-up Appointments: Return Appointment in 1 week. Anesthetic: Wound #10 Left Hand - 1st Digit: (In clinic) Topical Lidocaine 4% applied to wound bed Wound #8 Left,Medial Knee: (In clinic) Topical Lidocaine 4% applied to wound bed Wound #9 Right,Medial Knee: (In clinic) Topical Lidocaine 4% applied to wound  bed Bathing/ Shower/ Hygiene: May shower and wash wound with soap and water. - Dial gold antibacterial soap The following medication(s) was prescribed: lidocaine topical 4 % cream cream topical once daily for prior to debridement was prescribed at facility mupirocin topical 2 % ointment 1 Apply once daily to the wound bed starting 09/05/2022 WOUND #10: - Hand - 1st Digit Wound Laterality: Left Cleanser: Soap and Water 1 x Per Day/15 Days Discharge Instructions: May shower and wash wound with dial antibacterial soap and water prior to dressing change. Topical: Mupirocin Ointment 1 x Per Day/15 Days Discharge Instructions: Apply Mupirocin (Bactroban) as instructed Secondary Dressing: T Non-adherent Dressing, 2x3 in (Generic) 1 x Per Day/15 Days elfa Discharge Instructions: Apply over primary dressing as directed. Secured With: Child psychotherapist, Sterile 2x75 (in/in) 1 x Per Day/15 Days Discharge Instructions: Secure with stretch gauze as directed. Secured With: Paper T ape, 1x10 (in/yd) 1 x Per Day/15 Days Discharge Instructions: Secure dressing with tape as directed. WOUND #8: - Knee Wound Laterality: Left, Medial Cleanser: Soap and Water 1 x Per Day/15 Days Discharge Instructions: May shower and wash wound with dial antibacterial soap and water prior to dressing change. Prim Dressing: Xeroform Occlusive Gauze Dressing, 4x4 in (DME) (Generic) 1 x Per Day/15 Days ary Discharge Instructions: Apply to wound bed as instructed Secondary Dressing: ALLEVYN Gentle Border, 4x4 (in/in) (DME) (Generic) 1 x Per Day/15 Days Discharge Instructions: Apply over primary dressing as directed. Secondary Dressing: T Non-adherent Dressing, 2x3 in (DME) (Generic) 1 x Per Day/15 Days elfa Discharge Instructions: Apply over primary dressing as directed. WOUND #9: - Knee Wound Laterality: Right, Medial Cleanser: Soap and Water 1 x Per Day/15 Days Discharge Instructions: May shower and wash wound  with dial antibacterial soap and water prior to dressing change. Prim Dressing: Xeroform Occlusive Gauze Dressing, 4x4 in (DME) (Generic) 1 x Per Day/15 Days ary Richard [name], Richard [name] ([id number]) [id number] Page 9 of 11 Discharge Instructions: Apply to wound bed as instructed Secondary Dressing: ALLEVYN Gentle Border, 3x3 (in/in) (DME) (Generic) 1 x Per Day/15 Days Discharge Instructions: Apply over primary dressing as directed. Secondary Dressing: T Non-adherent Dressing, 2x3 in (DME) (Generic) 1 x Per Day/15 Days elfa Discharge Instructions: Apply over primary dressing as directed. 1. In office sharp debridement 2. Mupirocin ointment 3. Xeroform 4. Follow-up in 1 week Electronic Signature(s) Signed: 09/05/2022 4:58:12 PM By: Kalman Shan DO Signed: 09/05/2022 5:08:37 PM By: Sharyn Creamer RN, BSN Previous Signature: 09/05/2022 12:51:47 PM Version By: Kalman Shan DO Entered By: Sharyn Creamer on 09/05/2022 16:54:30 -------------------------------------------------------------------------------- HxROS Details Patient Name: Date of Service: Richard [name], Richard Larsson [name] 09/05/2022 8:45 A M Medical Record Number: [id number] Patient Account Number: [id number] Date of Birth/Sex: Treating RN: 1941/03/15 (82 y.o. Richard [name] Diener Primary Care Provider: Roma Schanz Other Clinician: Referring Provider: Treating Provider/Extender: Tharon Aquas in Treatment:  0 Information Obtained From Patient Eyes Medical History: Positive for: Cataracts - Surgery Hematologic/Lymphatic Medical History: Positive for: Anemia Respiratory Medical History: Positive for: Sleep Apnea Cardiovascular Medical History: Positive for: Arrhythmia; Congestive Heart Failure; Coronary Artery Disease; Hypertension Endocrine Medical History: Positive for: Type II Diabetes Time with diabetes: 20 years Treated with: Oral agents Blood sugar tested every day:  Yes Tested : Q am Musculoskeletal Medical History: Positive for: Osteoarthritis Past Medical History Notes: Spinal Stenosis Neurologic Medical History: Positive for: Neuropathy Oncologic Medical HistoryMarland Kitchen SEVERO, BRIAN ([id number]) [id number] Page 10 of 11 Positive for: Received Chemotherapy Negative for: Received Radiation Past Medical History Notes: 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 HBO Extended History Items Eyes: Cataracts Immunizations Pneumococcal Vaccine: Received Pneumococcal Vaccination: Yes Received Pneumococcal Vaccination On or After 60th Birthday: No Implantable Devices Yes Hospitalization / Surgery History Type of Hospitalization/Surgery cellulitis right leg 10/25-10/28/2023 balloon angioplasty 03/2021 12/29/2021 CHF Family and Social History Cancer: Yes - Mother; Diabetes: Yes - Maternal Grandparents; Heart Disease: Yes - Paternal Grandparents; Hereditary Spherocytosis: No; Hypertension: Yes - Paternal Grandparents; Kidney Disease: No; Lung Disease: No; Seizures: Yes - Child; Stroke: Yes - Father; Thyroid Problems: No; Tuberculosis: No; Never smoker; Marital Status - Divorced; Alcohol Use: Never; Drug Use: No History; Caffeine Use: Rarely; Financial Concerns: No; Food, Clothing or Shelter Needs: No; Support System Lacking: No; Transportation Concerns: No Electronic Signature(s) Signed: 09/05/2022 12:51:47 PM By: Kalman Shan DO Signed: 09/05/2022 4:49:59 PM By: Deon Pilling RN, BSN Signed: 09/05/2022 5:08:37 PM By: Sharyn Creamer RN, BSN Entered By: Sharyn Creamer on 09/05/2022 08:17:13 -------------------------------------------------------------------------------- [place] Details Patient Name: Date of Service: Richard Killings [name] 09/05/2022 Medical Record Number: [id number] Patient Account Number: [id number] Date of Birth/Sex: Treating RN: [birth year] (82 y.o. Mare Ferrari Primary Care Provider: Roma Schanz Other Clinician: Referring Provider: Treating Provider/Extender: Tharon Aquas in Treatment: 0 Diagnosis Coding ICD-10 Codes Code Description [phone] Unspecified open wound, left lower leg, initial encounter S81.801A Unspecified open wound, right lower leg, initial encounter S61.402A Unspecified open wound of left hand, initial encounter T79.8XXA Other early complications of trauma, initial encounter Facility Procedures : CPT4 Code: [id number] Description: 99214 - WOUND CARE VISIT-LEV 4 EST PT Modifier: 25 Quantity: 1 : CPT4 Code: [id number] Description: [id number] - DEBRIDE WOUND 1ST 20 SQ CM OR < ICD-10 Diagnosis Description S61.402A Unspecified open wound of left hand, initial encounter Modifier: Quantity: 1 Physician Procedures : CPT4 Code Description Modifier [id number] - WC PHYS LEVEL 3 - EST PT ICD-10 Diagnosis Description S81.802A Unspecified open wound, left lower leg, initial encounter DERWIN, POKORNEY ([id number]) S81.802A [id number]  Unspecified open wound, left lower leg, initial encounter S81.801A Unspecified open wound, right lower leg, initial encounter S61.402A Unspecified open wound of left hand, initial encounter T79.8XXA Other early complications of trauma, initial encounter Quantity: 1 df Page 11 of 11 : [id number] - WC PHYS DEBR WO ANESTH 20 SQ CM 1 ICD-10 Diagnosis Description S61.402A Unspecified open wound of left hand, initial encounter Quantity: Electronic Signature(s) Signed: 09/05/2022 12:51:47 PM By: Kalman Shan DO Entered By: Kalman Shan on 09/05/2022 10:55:47

## 2022-09-12 ENCOUNTER — Encounter (HOSPITAL_BASED_OUTPATIENT_CLINIC_OR_DEPARTMENT_OTHER): Payer: Medicare Other | Admitting: Internal Medicine

## 2022-09-12 DIAGNOSIS — E1140 Type 2 diabetes mellitus with diabetic neuropathy, unspecified: Secondary | ICD-10-CM | POA: Diagnosis not present

## 2022-09-12 DIAGNOSIS — S81802A Unspecified open wound, left lower leg, initial encounter: Secondary | ICD-10-CM | POA: Diagnosis not present

## 2022-09-12 DIAGNOSIS — I110 Hypertensive heart disease with heart failure: Secondary | ICD-10-CM | POA: Diagnosis not present

## 2022-09-12 DIAGNOSIS — C8330 Diffuse large B-cell lymphoma, unspecified site: Secondary | ICD-10-CM | POA: Diagnosis not present

## 2022-09-12 DIAGNOSIS — S61402A Unspecified open wound of left hand, initial encounter: Secondary | ICD-10-CM

## 2022-09-12 DIAGNOSIS — E1151 Type 2 diabetes mellitus with diabetic peripheral angiopathy without gangrene: Secondary | ICD-10-CM | POA: Diagnosis not present

## 2022-09-12 DIAGNOSIS — T798XXA Other early complications of trauma, initial encounter: Secondary | ICD-10-CM | POA: Diagnosis not present

## 2022-09-12 DIAGNOSIS — S81801A Unspecified open wound, right lower leg, initial encounter: Secondary | ICD-10-CM | POA: Diagnosis not present

## 2022-09-13 ENCOUNTER — Ambulatory Visit (HOSPITAL_BASED_OUTPATIENT_CLINIC_OR_DEPARTMENT_OTHER): Payer: Medicare Other | Admitting: Internal Medicine

## 2022-09-13 NOTE — Progress Notes (Signed)
BERLEY, TIBBETTS ([id number]) [id number] Page 1 of 8 Visit Report for 09/12/2022 Chief Complaint Document Details Patient Name: Date of Service: Richard [name] [name] 09/12/2022 1:30 PM Medical Record Number: [id number] Patient Account Number: [id number] Date of Birth/Sex: Treating RN: 1940/11/10 (82 y.o. M) Primary Care Provider: Roma Schanz Other Clinician: Referring Provider: Treating Provider/Extender: Tharon Aquas in Treatment: 1 Information Obtained from: Patient Chief Complaint 01/12/2021; Wounds to his left upper extremity and bilateral lower extremities. 05/14/2022; bilateral lower extremity wounds, right plantar foot wound 09/05/2022; bilateral lower extremity wounds, left thumb wound Electronic Signature(s) Signed: 09/12/2022 3:34:03 PM By: Kalman Shan DO Entered By: Kalman Shan on 09/12/2022 15:23:13 -------------------------------------------------------------------------------- HPI Details Patient Name: Date of Service: Richard [name], Richard [name] [name] 09/12/2022 1:30 PM Medical Record Number: [id number] Patient Account Number: [id number] Date of Birth/Sex: Treating RN: 1941/01/16 (82 y.o. M) Primary Care Provider: Roma Schanz Other Clinician: Referring Provider: Treating Provider/Extender: Tharon Aquas in Treatment: 1 History of Present Illness HPI Description: Admission 7/15 Richard [name] is an 82 year old male with a past medical history of type 2 diabetes on oral agents, diffuse large B-cell lymphoma, and coronary artery disease status post CABG that presents to the clinic for wounds to his left upper extremity and bilateral lower extremities. He states that he went to urgent care to be evaluated for a fever. And he was instructed to go to the ED to be further evaluated for possible sepsis. Unfortunately he passed out in his car After his appointment and was not discovered  for another 3 hours. He was eventually discovered and EMS had to take him out of his vehicle. This is when he developed abrasions to his left arm and legs. He has been using Xeroform to his wound beds every other day. He reports improvement to all the wounds except for the one on the back of his right leg. He denies pain or signs of infection. 7/22; patient presents for 1 week follow-up. He has no issues or complaints today. He has tolerated the compression wrap well on his right lower extremity. He has been able to do the dressing changes without issues to his left leg and left arm. His left arm wound has healed. He denies signs of infection. 7/29; patient presents for 1 week follow-up. He reports that his left leg wound is healed. He tolerated the compression wrap well on his right leg. He has no issues or complaints today. He denies signs of infection. 8/4; patient presents for 1 week follow-up. He continues to see improvement in wound healing to his right lower extremity. He has no issues or complaints today. He denies signs of infection. He does state that he went to the dermatologist and had some lesions removed on his face and left leg. He has been keeping these covered with a Band-Aid. 8/19; patient presents for follow-up. He is tolerated the compression wrap well. He has no issues or complaints today. He now only has 1 remaining wound. 9/1; patient presents for 1 week follow-up. He has tolerated the compression wrap well and has no issues or complaints today. He has been golfing 3 times weekly. He is in good spirits today. 9/15; patient presents for follow-up. He has no issues or complaints today. The wound is closed. Readmission 05/14/2022 Richard [name] is an 82 year old male with a past medical history of uncontrolled type 2 diabetes on oral agents, diffuse large B-cell lymphoma and coronary artery disease status post CABG that presents  the clinic for 3 wounds. He developed a right knee  wound when he fell out of his golf cart 3 weeks ago. He developed increased warmth and erythema to the right leg. While he was still waiting in the ED on 04/24/2022 for this issue he dropped his phone and created a wound to his left thigh. He has been using Xeroform to these areas. He ultimately required admission from the ED for right lower extremity cellulitis and was treated with Zyvox and Keflex. He also has a right plantar foot wound that started after having callus debridement by his podiatrist. He has had this wound for at least 6 weeks. For the foot wound he has not been keeping it covered and walks around barefoot. He has peripheral neuropathy However states that the wound site is painful. He denies any purulent drainage. He has swelling to this area. 11/21; patient presents for follow-up. He had an x-ray done at last clinic visit to the right foot that showed no focal bony erosion suggesting osteomyelitis. He completed his course of levofloxacin. He has been using his front offloading shoe. He has been using Xeroform to the knee wounds. He has no issues or complaints today. 11/28; patient presents for follow-up. His right knee wound has epithelialized. His right plantar foot wound remains closed. He has no issues or complaints today. MARL, SCHORK ([id number]) [id number] Page 2 of 8 09/05/2022 Richard [name] is an 82 year old male with a past medical history of type 2 diabetes on oral agents, diffuse large B-cell lymphoma and coronary artery disease status post CABG that presents the clinic for a 2-week history of wounds to his lower extremities bilaterally and his left thumb. He developed the wounds to his legs after his sister's dog jumped on him. He has been using Vaseline to the areas. He cut his thumb with a knife while cooking. He states he went to the ED and a used Dermabond on this. He currently denies signs of infection. 3/14; patient presents for  follow-up. He has been using Xeroform to his lower extremities bilaterally daily. He has been using antibiotic ointment to his left thumb. All wounds have shown improvement in healing. He has no issues or complaints today. Electronic Signature(s) Signed: 09/12/2022 3:34:03 PM By: Kalman Shan DO Entered By: Kalman Shan on 09/12/2022 15:23:46 -------------------------------------------------------------------------------- Physical Exam Details Patient Name: Date of Service: Richard [name], Richard [name] [name] 09/12/2022 1:30 PM Medical Record Number: [id number] Patient Account Number: [id number] Date of Birth/Sex: Treating RN: 1940/08/17 (82 y.o. M) Primary Care Provider: Roma Schanz Other Clinician: Referring Provider: Treating Provider/Extender: Tharon Aquas in Treatment: 1 Constitutional respirations regular, non-labored and within target range for patient.. Cardiovascular 2+ dorsalis pedis/posterior tibialis pulses. Psychiatric pleasant and cooperative. Notes T the lower extremities bilaterally there are open wounds to the proximal medial aspect. The wound beds have granulation tissue throughout. T the left thumb o o there is a small scab. Electronic Signature(s) Signed: 09/12/2022 3:34:03 PM By: Kalman Shan DO Entered By: Kalman Shan on 09/12/2022 15:24:21 -------------------------------------------------------------------------------- Physician Orders Details Patient Name: Date of Service: Richard [name], Richard [name] [name] 09/12/2022 1:30 PM Medical Record Number: [id number] Patient Account Number: [id number] Date of Birth/Sex: Treating RN: [birth year] (82 y.o. [name] Primary Care Provider: Roma Schanz Other Clinician: Referring Provider: Treating Provider/Extender: Tharon Aquas in Treatment: 1 Verbal / Phone Orders: No Diagnosis Coding Follow-up Appointments ppointment in 2 weeks. - Dr.  Heber [place] Return A Bathing/ Shower/ Hygiene May shower  and wash wound with soap and water. - Dial gold antibacterial soap Wound Treatment Wound #10 - Hand - 1st Digit Wound Laterality: Left Cleanser: Soap and Water 1 x Per Day/15 Days Discharge Instructions: May shower and wash wound with dial antibacterial soap and water prior to dressing change. Topical: Mupirocin Ointment 1 x Per [id number] Days Discharge Instructions: Apply Mupirocin (Bactroban) as instructed Secondary Dressing: band-aid 1 x Per Day/15 Days CAVALLI, PACIS ([id number]) [phone] Page 3 of 8 Wound #8 - Knee Wound Laterality: Left, Medial Cleanser: Soap and Water 1 x Per Day/15 Days Discharge Instructions: May shower and wash wound with dial antibacterial soap and water prior to dressing change. Prim Dressing: Xeroform Occlusive Gauze Dressing, 4x4 in (Generic) 1 x Per Day/15 Days ary Discharge Instructions: Apply to wound bed as instructed Secondary Dressing: ALLEVYN Gentle Border, 4x4 (in/in) (Generic) 1 x Per Day/15 Days Discharge Instructions: Apply over primary dressing as directed. Wound #9 - Knee Wound Laterality: Right, Medial Cleanser: Soap and Water 1 x Per [id number] Days Discharge Instructions: May shower and wash wound with dial antibacterial soap and water prior to dressing change. Prim Dressing: Xeroform Occlusive Gauze Dressing, 4x4 in (Generic) 1 x Per Day/15 Days ary Discharge Instructions: Apply to wound bed as instructed Secondary Dressing: ALLEVYN Gentle Border, 3x3 (in/in) (Generic) 1 x Per Day/15 Days Discharge Instructions: Apply over primary dressing as directed. Electronic Signature(s) Signed: 09/12/2022 3:34:03 PM By: Kalman Shan DO Entered By: Kalman Shan on 09/12/2022 15:24:30 -------------------------------------------------------------------------------- Problem List Details Patient Name: Date of Service: Richard [name], Richard [name] [name] 09/12/2022 1:30 PM Medical  Record Number: [id number] Patient Account Number: [id number] Date of Birth/Sex: Treating RN: 1940-10-17 (82 y.o. M) Primary Care Provider: Roma Schanz Other Clinician: Referring Provider: Treating Provider/Extender: Tharon Aquas in Treatment: 1 Active Problems ICD-10 Encounter Code Description Active Date MDM Diagnosis S81.802A Unspecified open wound, left lower leg, initial encounter 09/05/2022 No Yes S81.801A Unspecified open wound, right lower leg, initial encounter 09/05/2022 No Yes S61.402A Unspecified open wound of left hand, initial encounter 09/05/2022 No Yes T79.8XXA Other early complications of trauma, initial encounter 09/05/2022 No Yes Inactive Problems Resolved Problems Electronic Signature(s) Signed: 09/12/2022 3:34:03 PM By: Kalman Shan DO Entered By: Kalman Shan on 09/12/2022 15:22:58 Progress Note Details -------------------------------------------------------------------------------- [name] ([id number]) [id number] Page 4 of 8 Patient Name: Date of Service: Richard [name] [name] 09/12/2022 1:30 PM Medical Record Number: [id number] Patient Account Number: [id number] Date of Birth/Sex: Treating RN: February 04, 1941 (82 y.o. M) Primary Care Provider: Roma Schanz Other Clinician: Referring Provider: Treating Provider/Extender: Tharon Aquas in Treatment: 1 Subjective Chief Complaint Information obtained from Patient 01/12/2021; Wounds to his left upper extremity and bilateral lower extremities. 05/14/2022; bilateral lower extremity wounds, right plantar foot wound 09/05/2022; bilateral lower extremity wounds, left thumb wound History of Present Illness (HPI) Admission 7/15 Mr. Dhillon Pamer is an 82 year old male with a past medical history of type 2 diabetes on oral agents, diffuse large B-cell lymphoma, and coronary artery disease status post CABG that presents to  the clinic for wounds to his left upper extremity and bilateral lower extremities. He states that he went to urgent care to be evaluated for a fever. And he was instructed to go to the ED to be further evaluated for possible sepsis. Unfortunately he passed out in his car After his appointment and was not discovered for another 3 hours. He was eventually discovered and EMS had to take him out of his vehicle.  This is when he developed abrasions to his left arm and legs. He has been using Xeroform to his wound beds every other day. He reports improvement to all the wounds except for the one on the back of his right leg. He denies pain or signs of infection. 7/22; patient presents for 1 week follow-up. He has no issues or complaints today. He has tolerated the compression wrap well on his right lower extremity. He has been able to do the dressing changes without issues to his left leg and left arm. His left arm wound has healed. He denies signs of infection. 7/29; patient presents for 1 week follow-up. He reports that his left leg wound is healed. He tolerated the compression wrap well on his right leg. He has no issues or complaints today. He denies signs of infection. 8/4; patient presents for 1 week follow-up. He continues to see improvement in wound healing to his right lower extremity. He has no issues or complaints today. He denies signs of infection. He does state that he went to the dermatologist and had some lesions removed on his face and left leg. He has been keeping these covered with a Band-Aid. 8/19; patient presents for follow-up. He is tolerated the compression wrap well. He has no issues or complaints today. He now only has 1 remaining wound. 9/1; patient presents for 1 week follow-up. He has tolerated the compression wrap well and has no issues or complaints today. He has been golfing 3 times weekly. He is in good spirits today. 9/15; patient presents for follow-up. He has no issues or  complaints today. The wound is closed. Readmission 05/14/2022 Richard [name] is an 82 year old male with a past medical history of uncontrolled type 2 diabetes on oral agents, diffuse large B-cell lymphoma and coronary artery disease status post CABG that presents the clinic for 3 wounds. He developed a right knee wound when he fell out of his golf cart 3 weeks ago. He developed increased warmth and erythema to the right leg. While he was still waiting in the ED on 04/24/2022 for this issue he dropped his phone and created a wound to his left thigh. He has been using Xeroform to these areas. He ultimately required admission from the ED for right lower extremity cellulitis and was treated with Zyvox and Keflex. He also has a right plantar foot wound that started after having callus debridement by his podiatrist. He has had this wound for at least 6 weeks. For the foot wound he has not been keeping it covered and walks around barefoot. He has peripheral neuropathy However states that the wound site is painful. He denies any purulent drainage. He has swelling to this area. 11/21; patient presents for follow-up. He had an x-ray done at last clinic visit to the right foot that showed no focal bony erosion suggesting osteomyelitis. He completed his course of levofloxacin. He has been using his front offloading shoe. He has been using Xeroform to the knee wounds. He has no issues or complaints today. 11/28; patient presents for follow-up. His right knee wound has epithelialized. His right plantar foot wound remains closed. He has no issues or complaints today. 09/05/2022 Richard [name] is an 82 year old male with a past medical history of type 2 diabetes on oral agents, diffuse large B-cell lymphoma and coronary artery disease status post CABG that presents the clinic for a 2-week history of wounds to his lower extremities bilaterally and his left thumb. He developed the wounds to  his legs after his  sister's dog jumped on him. He has been using Vaseline to the areas. He cut his thumb with a knife while cooking. He states he went to the ED and a used Dermabond on this. He currently denies signs of infection. 3/14; patient presents for follow-up. He has been using Xeroform to his lower extremities bilaterally daily. He has been using antibiotic ointment to his left thumb. All wounds have shown improvement in healing. He has no issues or complaints today. Patient History Information obtained from Patient. Family History Cancer - Mother, Diabetes - Maternal Grandparents, Heart Disease - Paternal Grandparents, Hypertension - Paternal Grandparents, Seizures - Child, Stroke - Father, No family history of Hereditary Spherocytosis, Kidney Disease, Lung Disease, Thyroid Problems, Tuberculosis. Social History Never smoker, Marital Status - Divorced, Alcohol Use - Never, Drug Use - No History, Caffeine Use - Rarely. Medical History Eyes Patient has history of Cataracts - Surgery Hematologic/Lymphatic Patient has history of Anemia Respiratory Patient has history of Sleep Apnea Cardiovascular Patient has history of Arrhythmia, Congestive Heart Failure, Coronary Artery Disease, Hypertension Endocrine Patient has history of Type II Diabetes Musculoskeletal BENINO, HED ([id number]) [phone].pdf Page 5 of 8 Patient has history of Osteoarthritis Neurologic Patient has history of Neuropathy Oncologic Patient has history of Received Chemotherapy Denies history of Received Radiation Hospitalization/Surgery History - cellulitis right leg 10/25-10/28/2023. - balloon angioplasty 03/2021. - 12/29/2021 CHF. Medical A Surgical History Notes nd Musculoskeletal Spinal Stenosis Oncologic 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 Objective Constitutional respirations regular, non-labored and within target range for patient.. Vitals Time Taken: 1:21 AM, Height: 72 in,  Weight: 226 lbs, BMI: 30.6, Temperature: 97.7 F, Pulse: 67 bpm, Respiratory Rate: 20 breaths/min, Blood Pressure: 114/68 mmHg. Cardiovascular 2+ dorsalis pedis/posterior tibialis pulses. Psychiatric pleasant and cooperative. General Notes: T the lower extremities bilaterally there are open wounds to the proximal medial aspect. The wound beds have granulation tissue throughout. T o o the left thumb there is a small scab. Integumentary (Hair, Skin) Wound #10 status is Open. Original cause of wound was Laceration. The date acquired was: 08/29/2022. The wound has been in treatment 1 weeks. The wound is located on the Left Hand - 1st Digit. The wound measures 0.4cm length x 0.5cm width x 0.1cm depth; 0.157cm^2 area and 0.016cm^3 volume. There is Fat Layer (Subcutaneous Tissue) exposed. There is no tunneling or undermining noted. There is a medium amount of serosanguineous drainage noted. The wound margin is distinct with the outline attached to the wound base. There is large (67-100%) red granulation within the wound bed. There is a small (1-33%) amount of necrotic tissue within the wound bed including Eschar. The periwound skin appearance had no abnormalities noted for texture. The periwound skin appearance had no abnormalities noted for moisture. The periwound skin appearance had no abnormalities noted for color. Periwound temperature was noted as No Abnormality. Wound #8 status is Open. Original cause of wound was Laceration. The date acquired was: 08/19/2022. The wound has been in treatment 1 weeks. The wound is located on the Left,Medial Knee. The wound measures 2.9cm length x 1.9cm width x 0.1cm depth; 4.328cm^2 area and 0.433cm^3 volume. There is Fat Layer (Subcutaneous Tissue) exposed. There is no tunneling or undermining noted. There is a medium amount of serosanguineous drainage noted. The wound margin is distinct with the outline attached to the wound base. There is large (67-100%) red  granulation within the wound bed. There is a small (1-33%) amount of necrotic tissue within  the wound bed including Adherent Slough. The periwound skin appearance had no abnormalities noted for texture. The periwound skin appearance had no abnormalities noted for moisture. The periwound skin appearance had no abnormalities noted for color. Periwound temperature was noted as No Abnormality. Wound #9 status is Open. Original cause of wound was Laceration. The date acquired was: 08/19/2022. The wound has been in treatment 1 weeks. The wound is located on the Right,Medial Knee. The wound measures 1.1cm length x 1.5cm width x 0.1cm depth; 1.296cm^2 area and 0.13cm^3 volume. There is Fat Layer (Subcutaneous Tissue) exposed. There is no tunneling or undermining noted. There is a medium amount of serosanguineous drainage noted. The wound margin is distinct with the outline attached to the wound base. There is large (67-100%) red granulation within the wound bed. There is no necrotic tissue within the wound bed. The periwound skin appearance had no abnormalities noted for texture. The periwound skin appearance had no abnormalities noted for moisture. The periwound skin appearance had no abnormalities noted for color. Periwound temperature was noted as No Abnormality. Assessment Active Problems ICD-10 Unspecified open wound, left lower leg, initial encounter Unspecified open wound, right lower leg, initial encounter Unspecified open wound of left hand, initial encounter Other early complications of trauma, initial encounter Patient's wounds are well-healing. I recommended continuing Xeroform to the lower extremity wounds and antibiotic ointment to the left thumb. Follow-up in 2 weeks. CRISTYAN, SPORE ([id number]) [id number] Page 6 of 8 Plan Follow-up Appointments: Return Appointment in 2 weeks. - Dr. Heber [place] Bathing/ Shower/ Hygiene: May shower and wash wound with soap and  water. - Dial gold antibacterial soap WOUND #10: - Hand - 1st Digit Wound Laterality: Left Cleanser: Soap and Water 1 x Per Day/15 Days Discharge Instructions: May shower and wash wound with dial antibacterial soap and water prior to dressing change. Topical: Mupirocin Ointment 1 x Per Day/15 Days Discharge Instructions: Apply Mupirocin (Bactroban) as instructed Secondary Dressing: band-aid 1 x Per Day/15 Days WOUND #8: - Knee Wound Laterality: Left, Medial Cleanser: Soap and Water 1 x Per Day/15 Days Discharge Instructions: May shower and wash wound with dial antibacterial soap and water prior to dressing change. Prim Dressing: Xeroform Occlusive Gauze Dressing, 4x4 in (Generic) 1 x Per Day/15 Days ary Discharge Instructions: Apply to wound bed as instructed Secondary Dressing: ALLEVYN Gentle Border, 4x4 (in/in) (Generic) 1 x Per Day/15 Days Discharge Instructions: Apply over primary dressing as directed. WOUND #9: - Knee Wound Laterality: Right, Medial Cleanser: Soap and Water 1 x Per Day/15 Days Discharge Instructions: May shower and wash wound with dial antibacterial soap and water prior to dressing change. Prim Dressing: Xeroform Occlusive Gauze Dressing, 4x4 in (Generic) 1 x Per Day/15 Days ary Discharge Instructions: Apply to wound bed as instructed Secondary Dressing: ALLEVYN Gentle Border, 3x3 (in/in) (Generic) 1 x Per Day/15 Days Discharge Instructions: Apply over primary dressing as directed. 1. Xeroformoobilateral lower extremities 2. Antibiotic ointmentooleft thumb 3. Follow-up in 2 weeks Electronic Signature(s) Signed: 09/12/2022 3:34:03 PM By: Kalman Shan DO Entered By: Kalman Shan on 09/12/2022 15:25:54 -------------------------------------------------------------------------------- HxROS Details Patient Name: Date of Service: Richard [name], [name] 09/12/2022 1:30 PM Medical Record Number: [id number] Patient Account Number: [id number] Date of Birth/Sex:  Treating RN: 10-29-1940 (82 y.o. M) Primary Care Provider: Roma Schanz Other Clinician: Referring Provider: Treating Provider/Extender: Tharon Aquas in Treatment: 1 Information Obtained From Patient Eyes Medical History: Positive for: Cataracts - Surgery Hematologic/Lymphatic Medical History: Positive for: Anemia Respiratory Medical History: Positive  for: Sleep Apnea Cardiovascular Medical History: Positive for: Arrhythmia; Congestive Heart Failure; Coronary Artery Disease; Hypertension Endocrine Medical History: Positive for: Type II Diabetes Time with diabetes: 20 years Treated with: Oral agents RHYLEY, ZILCH ([id number]) [phone].pdf Page 7 of 8 Blood sugar tested every day: Yes Tested : Q am Musculoskeletal Medical History: Positive for: Osteoarthritis Past Medical History Notes: Spinal Stenosis Neurologic Medical History: Positive for: Neuropathy Oncologic Medical History: Positive for: Received Chemotherapy Negative for: Received Radiation Past Medical History Notes: 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 HBO Extended History Items Eyes: Cataracts Immunizations Pneumococcal Vaccine: Received Pneumococcal Vaccination: Yes Received Pneumococcal Vaccination On or After 60th Birthday: No Implantable Devices Yes Hospitalization / Surgery History Type of Hospitalization/Surgery cellulitis right leg 10/25-10/28/2023 balloon angioplasty 03/2021 12/29/2021 CHF Family and Social History Cancer: Yes - Mother; Diabetes: Yes - Maternal Grandparents; Heart Disease: Yes - Paternal Grandparents; Hereditary Spherocytosis: No; Hypertension: Yes - Paternal Grandparents; Kidney Disease: No; Lung Disease: No; Seizures: Yes - Child; Stroke: Yes - Father; Thyroid Problems: No; Tuberculosis: No; Never smoker; Marital Status - Divorced; Alcohol Use: Never; Drug Use: No History; Caffeine Use: Rarely;  Financial Concerns: No; Food, Clothing or Shelter Needs: No; Support System Lacking: No; Transportation Concerns: No Electronic Signature(s) Signed: 09/12/2022 3:34:03 PM By: Kalman Shan DO Entered By: Kalman Shan on 09/12/2022 15:23:53 -------------------------------------------------------------------------------- SuperBill Details Patient Name: Date of Service: Richard [name], Richard [name] [name] 09/12/2022 Medical Record Number: [id number] Patient Account Number: [id number] Date of Birth/Sex: Treating RN: 26-May-1941 (82 y.o. [name] Primary Care Provider: Roma Schanz Other Clinician: Referring Provider: Treating Provider/Extender: Tharon Aquas in Treatment: 1 Diagnosis Coding ICD-10 Codes Code Description [phone] Unspecified open wound, left lower leg, initial encounter S81.801A Unspecified open wound, right lower leg, initial encounter S61.402A Unspecified open wound of left hand, initial encounter T79.8XXA Other early complications of trauma, initial encounter YONIS, RIEHL ([id number]) [id number] Page 8 of 8 Facility Procedures : CPT4 Code: [id number] Description: 99213 - WOUND CARE VISIT-LEV 3 EST PT Modifier: Quantity: 1 Physician Procedures : CPT4 Code Description Modifier [id number] - WC PHYS LEVEL 3 - EST PT ICD-10 Diagnosis Description S81.802A Unspecified open wound, left lower leg, initial encounter S81.801A Unspecified open wound, right lower leg, initial encounter S61.402A  Unspecified open wound of left hand, initial encounter T79.8XXA Other early complications of trauma, initial encounter Quantity: 1 Electronic Signature(s) Signed: 09/12/2022 3:34:03 PM By: Kalman Shan DO Entered By: Kalman Shan on 09/12/2022 15:26:06

## 2022-09-13 NOTE — Progress Notes (Signed)
BRANDONN, FLEURY ([id number]) [id number] Page 1 of 11 Visit Report for 09/12/2022 Arrival Information Details Patient Name: Date of Service: Richard [name] [name] 09/12/2022 1:30 PM Medical Record Number: [id number] Patient Account Number: [id number] Date of Birth/Sex: Treating RN: [birth year] (82 y.o. M) Primary Care Aerin Delany: Roma Schanz Other Clinician: Referring Richard [name]: Treating Nickalos Petersen/Extender: Tharon Aquas in Treatment: 1 Visit Information History Since Last Visit All ordered tests and consults were completed: No Patient Arrived: Ambulatory Added or deleted any medications: No Arrival Time: 13:21 Any new allergies or adverse reactions: No Accompanied By: self Had a fall or experienced change in No Transfer Assistance: None activities of daily living that may affect Patient Identification Verified: Yes risk of falls: Secondary Verification Process Completed: Yes Signs or symptoms of abuse/neglect since last visito No Patient Has Alerts: Yes Hospitalized since last visit: No Patient Alerts: Patient on Blood Thinner Implantable device outside of the clinic excluding No cellular tissue based products placed in the center since last visit: Pain Present Now: No Electronic Signature(s) Signed: 09/12/2022 3:18:15 PM By: Worthy Rancher Entered By: Worthy Rancher on 09/12/2022 13:21:55 -------------------------------------------------------------------------------- Clinic Level of Care Assessment Details Patient Name: Date of Service: Richard [name] [name] 09/12/2022 1:30 PM Medical Record Number: [id number] Patient Account Number: [id number] Date of Birth/Sex: Treating RN: [birth year] (82 y.o. Richard [name] Primary Care Richard [name]: Roma Schanz Other Clinician: Referring Richard [name]: Treating Richard [name]/Extender: Tharon Aquas in Treatment: 1 Clinic Level of Care Assessment Items TOOL 4  Quantity Score X- 1 0 Use when only an EandM is performed on FOLLOW-UP visit ASSESSMENTS - Nursing Assessment / Reassessment []  - 0 Reassessment of Co-morbidities (includes updates in patient status) []  - 0 Reassessment of Adherence to Treatment Plan ASSESSMENTS - Wound and Skin A ssessment / Reassessment []  - 0 Simple Wound Assessment / Reassessment - one wound X- 3 5 Complex Wound Assessment / Reassessment - multiple wounds []  - 0 Dermatologic / Skin Assessment (not related to wound area) ASSESSMENTS - Focused Assessment []  - 0 Circumferential Edema Measurements - multi extremities []  - 0 Nutritional Assessment / Counseling / Intervention []  - 0 Lower Extremity Assessment (monofilament, tuning fork, pulses) []  - 0 Peripheral Arterial Disease Assessment (using hand held doppler) ASSESSMENTS - Ostomy and/or Continence Assessment and Care []  - 0 Incontinence Assessment and Management []  - 0 Ostomy Care Assessment and Management (repouching, etc.) PROCESS - Coordination of Care X - Simple Patient / Family Education for ongoing care 1 15 DAHANI, PEREZHERNANDEZ ([id number] Page 2 of 11 []  - 0 Complex (extensive) Patient / Family Education for ongoing care X- 1 10 Staff obtains Programmer, systems, Records, T Results / Process Orders est []  - 0 Staff telephones HHA, Nursing Homes / Clarify orders / etc []  - 0 Routine Transfer to another Facility (non-emergent condition) []  - 0 Routine Hospital Admission (non-emergent condition) []  - 0 New Admissions / Biomedical engineer / Ordering NPWT Apligraf, etc. , []  - 0 Emergency Hospital Admission (emergent condition) X- 1 10 Simple Discharge Coordination []  - 0 Complex (extensive) Discharge Coordination PROCESS - Special Needs []  - 0 Pediatric / Minor Patient Management []  - 0 Isolation Patient Management []  - 0 Hearing / Language / Visual special needs []  - 0 Assessment of Community assistance  (transportation, D/C planning, etc.) []  - 0 Additional assistance / Altered mentation []  - 0 Support Surface(s) Assessment (bed, cushion, seat, etc.) INTERVENTIONS - Wound Cleansing / Measurement X - Simple Wound Cleansing -  one wound 1 5 []  - 0 Complex Wound Cleansing - multiple wounds X- 1 5 Wound Imaging (photographs - any number of wounds) []  - 0 Wound Tracing (instead of photographs) []  - 0 Simple Wound Measurement - one wound X- 3 5 Complex Wound Measurement - multiple wounds INTERVENTIONS - Wound Dressings X - Small Wound Dressing one or multiple wounds 3 10 []  - 0 Medium Wound Dressing one or multiple wounds []  - 0 Large Wound Dressing one or multiple wounds []  - 0 Application of Medications - topical []  - 0 Application of Medications - injection INTERVENTIONS - Miscellaneous []  - 0 External ear exam []  - 0 Specimen Collection (cultures, biopsies, blood, body fluids, etc.) []  - 0 Specimen(s) / Culture(s) sent or taken to Lab for analysis []  - 0 Patient Transfer (multiple staff / Civil Service fast streamer / Similar devices) []  - 0 Simple Staple / Suture removal (25 or less) []  - 0 Complex Staple / Suture removal (26 or more) []  - 0 Hypo / Hyperglycemic Management (close monitor of Blood Glucose) []  - 0 Ankle / Brachial Index (ABI) - do not check if billed separately X- 1 5 Vital Signs Has the patient been seen at the hospital within the last three years: Yes Total Score: 110 Level Of Care: New/Established - Level 3 Electronic Signature(s) Signed: 09/12/2022 4:01:22 PM By: Adline Peals Entered By: Adline Peals on 09/12/2022 13:51:55 [name] ([id number] Page 3 of 11 -------------------------------------------------------------------------------- Encounter Discharge Information Details Patient Name: Date of Service: Richard [name] [name] 09/12/2022 1:30 PM Medical Record Number: [id number] Patient Account Number:  [id number] Date of Birth/Sex: Treating RN: [birth year] (82 y.o. Richard [name] Primary Care Richard [name]: Roma Schanz Other Clinician: Referring Richard [name]: Treating Richard [name]/Extender: Tharon Aquas in Treatment: 1 Encounter Discharge Information Items Discharge Condition: Stable Ambulatory Status: Ambulatory Discharge Destination: Home Transportation: Private Auto Accompanied By: self Schedule Follow-up Appointment: Yes Clinical Summary of Care: Patient Declined Electronic Signature(s) Signed: 09/12/2022 4:01:22 PM By: Adline Peals Entered By: Adline Peals on 09/12/2022 13:52:21 -------------------------------------------------------------------------------- Lower Extremity Assessment Details Patient Name: Date of Service: Richard [name] [name] 09/12/2022 1:30 PM Medical Record Number: [id number] Patient Account Number: [id number] Date of Birth/Sex: Treating RN: [birth year] (82 y.o. Richard [name] Primary Care Dream Nodal: Roma Schanz Other Clinician: Referring Yalonda Sample: Treating Mysty Kielty/Extender: Tharon Aquas in Treatment: 1 Electronic Signature(s) Signed: 09/12/2022 4:01:22 PM By: Adline Peals Entered By: Adline Peals on 09/12/2022 13:27:27 -------------------------------------------------------------------------------- Multi Wound Chart Details Patient Name: Date of Service: [name], [name] 09/12/2022 1:30 PM Medical Record Number: [id number] Patient Account Number: [id number] Date of Birth/Sex: Treating RN: [birth year] (82 y.o. M) Primary Care Estanislado Surgeon: Roma Schanz Other Clinician: Referring Arik Husmann: Treating Zen Cedillos/Extender: Tharon Aquas in Treatment: 1 Vital Signs Height(in): 72 Pulse(bpm): 46 Weight(lbs): 226 Blood Pressure(mmHg): 114/68 Body Mass Index(BMI): 30.6 Temperature(F): 97.7 Respiratory Rate(breaths/min):  20 [10:Photos:] ERCIL, GURKIN ([id number]) [10:Left Hand - 1st Digit Wound Location: Laceration Wounding Event: Trauma, Other Primary Etiology: Cataracts, Anemia, Sleep Apnea, Comorbid History: Arrhythmia, Congestive Heart Failure, Arrhythmia, Congestive Heart Failure, Arrhythmia, Congestive  Heart Failure, Coronary Artery Disease, Hypertension, Type II Diabetes, Osteoarthritis, Neuropathy, Received Osteoarthritis, Neuropathy, Received Osteoarthritis, Neuropathy, Received Chemotherapy 08/29/2022 Date Acquired: 1 Weeks of Treatment: Open Wound  Status: No Wound Recurrence: No Clustered Wound: N/A Clustered Quantity: 0.4x0.5x0.1 Measurements L x W x D (cm) 0.157 A (cm) : rea 0.016 Volume (cm) : 75.00% % Reduction in Area: 74.60% % Reduction in  Volume: Full Thickness Without Exposed  Classification: Support Structures Medium Exudate A mount: Serosanguineous Exudate Type: red, brown Exudate Color: Distinct, outline attached Wound Margin: Large (67-100%) Granulation Amount: Red Granulation Quality: Small (1-33%) Necrotic Amount: Eschar  Necrotic Tissue: Fat Layer (Subcutaneous Tissue): Yes Fat Layer (Subcutaneous Tissue): Yes Fat Layer (Subcutaneous Tissue): Yes Exposed Structures: Fascia: No Tendon: No Muscle: No Joint: No Bone: No Small (1-33%) Epithelialization: No Abnormalities  Noted Periwound Skin Texture: No Abnormalities Noted Periwound Skin Moisture: No Abnormalities Noted Periwound Skin Color: No Abnormality Temperature:] [8:Left, Medial Knee Laceration Skin T ear Cataracts, Anemia, Sleep Apnea, Coronary Artery Disease,  Hypertension, Type II Diabetes, Chemotherapy 08/19/2022 1 Open No Yes 2 2.9x1.9x0.1 4.328 0.433 81.90% 81.90% Full Thickness Without Exposed Support Structures Medium Serosanguineous red, brown Distinct, outline attached Large (67-100%) Red Small (1-33%)  Adherent Slough Fascia: No Tendon: No Muscle: No Joint: No Bone: No Small (1-33%) No Abnormalities Noted No Abnormalities Noted No  Abnormalities Noted No Abnormality] [phone].pdf Page 4 of 11 Right, Medial Knee Laceration  Skin T ear Cataracts, Anemia, Sleep Apnea, Coronary Artery Disease, Hypertension, Type II Diabetes, Chemotherapy 08/19/2022 1 Open No No N/A 1.1x1.5x0.1 1.296 0.13 17.50% 17.20% Full Thickness Without Exposed Support Structures Medium Serosanguineous red,  brown Distinct, outline attached Large (67-100%) Red None Present (0%) N/A Fascia: No Tendon: No Muscle: No Joint: No Bone: No Small (1-33%) No Abnormalities Noted No Abnormalities Noted No Abnormalities Noted No Abnormality] Treatment Notes Wound #10 (Hand - 1st Digit) Wound Laterality: Left Cleanser Soap and Water Discharge Instruction: May shower and wash wound with dial antibacterial soap and water prior to dressing change. Peri-Wound Care Topical Mupirocin Ointment Discharge Instruction: Apply Mupirocin (Bactroban) as instructed Primary Dressing Secondary Dressing band-aid Secured With Compression Wrap Compression Stockings Add-Ons Wound #8 (Knee) Wound Laterality: Left, Medial Cleanser Soap and Water Discharge Instruction: May shower and wash wound with dial antibacterial soap and water prior to dressing change. Peri-Wound Care Topical Primary Dressing Xeroform Occlusive Gauze Dressing, 4x4 in Discharge Instruction: Apply to wound bed as instructed MOSA, HILLIER ([id number]) [id number] Page 5 of 11 Secondary Dressing ALLEVYN Gentle Border, 4x4 (in/in) Discharge Instruction: Apply over primary dressing as directed. Secured With Compression Wrap Compression Stockings Add-Ons Wound #9 (Knee) Wound Laterality: Right, Medial Cleanser Soap and Water Discharge Instruction: May shower and wash wound with dial antibacterial soap and water prior to dressing change. Peri-Wound Care Topical Primary Dressing Xeroform Occlusive Gauze Dressing, 4x4 in Discharge Instruction: Apply to  wound bed as instructed Secondary Dressing ALLEVYN Gentle Border, 3x3 (in/in) Discharge Instruction: Apply over primary dressing as directed. Secured With Compression Wrap Compression Stockings Add-Ons Electronic Signature(s) Signed: 09/12/2022 3:34:03 PM By: Kalman Shan DO Entered By: Kalman Shan on 09/12/2022 15:23:04 -------------------------------------------------------------------------------- Multi-Disciplinary Care Plan Details Patient Name: Date of Service: [name], [name] 09/12/2022 1:30 PM Medical Record Number: [id number] Patient Account Number: [id number] Date of Birth/Sex: Treating RN: Jul 16, 1940 (82 y.o. Richard [name] Primary Care Kloie Whiting: Roma Schanz Other Clinician: Referring Takumi Din: Treating Harli Engelken/Extender: Tharon Aquas in Treatment: 1 Active Inactive Nutrition Nursing Diagnoses: Imbalanced nutrition Potential for alteratiion in Nutrition/Potential for imbalanced nutrition Goals: Patient/caregiver verbalizes understanding of need to maintain therapeutic glucose control per primary care physician Date Initiated: 09/05/2022 Target Resolution Date: 10/03/2022 Goal Status: Active Interventions: Assess patient nutrition upon admission and as needed per policy Provide education on elevated blood sugars and impact on wound healing Notes: DAKAR, STEGNER ([id number] Page 6 of 11 Nursing  Diagnoses: Impaired tissue integrity Knowledge deficit related to ulceration/compromised skin integrity Goals: Patient/caregiver will verbalize understanding of skin care regimen Date Initiated: 09/05/2022 Target Resolution Date: 10/03/2022 Goal Status: Active Ulcer/skin breakdown will have a volume reduction of 30% by week 4 Date Initiated: 09/05/2022 Target Resolution Date: 10/03/2022 Goal Status: Active Interventions: Assess patient/caregiver ability to obtain  necessary supplies Assess patient/caregiver ability to perform ulcer/skin care regimen upon admission and as needed Assess ulceration(s) every visit Provide education on ulcer and skin care Notes: Electronic Signature(s) Signed: 09/12/2022 4:01:22 PM By: Adline Peals Entered By: Adline Peals on 09/12/2022 13:27:39 -------------------------------------------------------------------------------- Pain Assessment Details Patient Name: Date of Service: [name], [name] 09/12/2022 1:30 PM Medical Record Number: [id number] Patient Account Number: [id number] Date of Birth/Sex: Treating RN: [birth year] (82 y.o. M) Primary Care Timberlyn Pickford: Roma Schanz Other Clinician: Referring Cassadi Purdie: Treating Ha Placeres/Extender: Tharon Aquas in Treatment: 1 Active Problems Location of Pain Severity and Description of Pain Patient Has Paino No Site Locations Pain Management and Medication Current Pain Management: Electronic Signature(s) Signed: 09/12/2022 3:18:15 PM By: Worthy Rancher Entered By: Worthy Rancher on 09/12/2022 13:22:36 -------------------------------------------------------------------------------- Patient/Caregiver Education Details Patient Name: Date of Service: Richard [name] [name] 3/14/2024andnbsp1:30 PM [name] ([id number] Page 7 of 11 Medical Record Number: [id number] Patient Account Number: [id number] Date of Birth/Gender: Treating RN: [birth year] (82 y.o. Richard [name] Primary Care Physician: Roma Schanz Other Clinician: Referring Physician: Treating Physician/Extender: Tharon Aquas in Treatment: 1 Education Assessment Education Provided To: Patient Education Topics Provided Wound/Skin Impairment: Methods: Explain/Verbal Responses: Reinforcements needed, State content correctly Electronic Signature(s) Signed: 09/12/2022 4:01:22 PM By: Adline Peals Entered By: Adline Peals on 09/12/2022 13:27:53 -------------------------------------------------------------------------------- Wound Assessment Details Patient Name: Date of Service: [name], [name] 09/12/2022 1:30 PM Medical Record Number: [id number] Patient Account Number: [id number] Date of Birth/Sex: Treating RN: May 25, 1941 (82 y.o. M) Primary Care Odelle Kosier: Roma Schanz Other Clinician: Referring Devantae Babe: Treating Vicy Medico/Extender: Tharon Aquas in Treatment: 1 Wound Status Wound Number: 10 Primary Trauma, Other Etiology: Wound Location: Left Hand - 1st Digit Wound Open Wounding Event: Laceration Status: Date Acquired: 08/29/2022 Comorbid Cataracts, Anemia, Sleep Apnea, Arrhythmia, Congestive Heart Weeks Of Treatment: 1 History: Failure, Coronary Artery Disease, Hypertension, Type II Diabetes, Clustered Wound: No Osteoarthritis, Neuropathy, Received Chemotherapy Photos Wound Measurements Length: (cm) 0.4 Width: (cm) 0.5 Depth: (cm) 0.1 Area: (cm) 0.157 Volume: (cm) 0.016 % Reduction in Area: 75% % Reduction in Volume: 74.6% Epithelialization: Small (1-33%) Tunneling: No Undermining: No Wound Description Classification: Full Thickness Without Exposed Support Structures Wound Margin: Distinct, outline attached Exudate Amount: Medium Exudate Type: Serosanguineous Exudate Color: red, brown Foul Odor After Cleansing: No Slough/Fibrino No Wound Bed Granulation Amount: Large (67-100%) Exposed [address] KYLYN, WANTZ ([id number] Page 8 of 11 Granulation Quality: Red Fascia Exposed: No Necrotic Amount: Small (1-33%) Fat Layer (Subcutaneous Tissue) Exposed: Yes Necrotic Quality: Eschar Tendon Exposed: No Muscle Exposed: No Joint Exposed: No Bone Exposed: No Periwound Skin Texture Texture Color No Abnormalities Noted: Yes No Abnormalities Noted: Yes Moisture  Temperature / Pain No Abnormalities Noted: Yes Temperature: No Abnormality Treatment Notes Wound #10 (Hand - 1st Digit) Wound Laterality: Left Cleanser Soap and Water Discharge Instruction: May shower and wash wound with dial antibacterial soap and water prior to dressing change. Peri-Wound Care Topical Mupirocin Ointment Discharge Instruction: Apply Mupirocin (Bactroban) as instructed Primary Dressing Secondary Dressing band-aid Secured With Compression Wrap Compression Stockings Add-Ons Electronic Signature(s) Signed: 09/12/2022 4:01:22 PM  By: Adline Peals Entered By: Adline Peals on 09/12/2022 13:34:12 -------------------------------------------------------------------------------- Wound Assessment Details Patient Name: Date of Service: Richard [name] [name] 09/12/2022 1:30 PM Medical Record Number: [id number] Patient Account Number: [id number] Date of Birth/Sex: Treating RN: Oct 25, 1940 (82 y.o. M) Primary Care Vernecia Umble: Roma Schanz Other Clinician: Referring Elmira Olkowski: Treating Justino Boze/Extender: Tharon Aquas in Treatment: 1 Wound Status Wound Number: 8 Primary Skin T ear Etiology: Wound Location: Left, Medial Knee Wound Open Wounding Event: Laceration Status: Date Acquired: 08/19/2022 Comorbid Cataracts, Anemia, Sleep Apnea, Arrhythmia, Congestive Heart Weeks Of Treatment: 1 History: Failure, Coronary Artery Disease, Hypertension, Type II Diabetes, Clustered Wound: Yes Osteoarthritis, Neuropathy, Received Chemotherapy Photos BAINE, BRESTER ([id number] Page 9 of 11 Wound Measurements Length: (cm) Width: (cm) Depth: (cm) Clustered Quantity: Area: (cm) Volume: (cm) 2.9 % Reduction in Area: 81.9% 1.9 % Reduction in Volume: 81.9% 0.1 Epithelialization: Small (1-33%) 2 Tunneling: No 4.328 Undermining: No 0.433 Wound Description Classification: Full Thickness Without Exposed  Sup Wound Margin: Distinct, outline attached Exudate Amount: Medium Exudate Type: Serosanguineous Exudate Color: red, brown port Structures Foul Odor After Cleansing: No Slough/Fibrino Yes Wound Bed Granulation Amount: Large (67-100%) Exposed Structure Granulation Quality: Red Fascia Exposed: No Necrotic Amount: Small (1-33%) Fat Layer (Subcutaneous Tissue) Exposed: Yes Necrotic Quality: Adherent Slough Tendon Exposed: No Muscle Exposed: No Joint Exposed: No Bone Exposed: No Periwound Skin Texture Texture Color No Abnormalities Noted: Yes No Abnormalities Noted: Yes Moisture Temperature / Pain No Abnormalities Noted: Yes Temperature: No Abnormality Treatment Notes Wound #8 (Knee) Wound Laterality: Left, Medial Cleanser Soap and Water Discharge Instruction: May shower and wash wound with dial antibacterial soap and water prior to dressing change. Peri-Wound Care Topical Primary Dressing Xeroform Occlusive Gauze Dressing, 4x4 in Discharge Instruction: Apply to wound bed as instructed Secondary Dressing ALLEVYN Gentle Border, 4x4 (in/in) Discharge Instruction: Apply over primary dressing as directed. Secured With Compression Wrap Compression Stockings Add-Ons Electronic Signature(s) Signed: 09/12/2022 4:01:22 PM By: Adline Peals Entered By: Adline Peals on 09/12/2022 13:34:43 [name] ([id number] Page 10 of 11 -------------------------------------------------------------------------------- Wound Assessment Details Patient Name: Date of Service: Richard [name] [name] 09/12/2022 1:30 PM Medical Record Number: [id number] Patient Account Number: [id number] Date of Birth/Sex: Treating RN: 1941-06-06 (82 y.o. M) Primary Care Ayen Viviano: Roma Schanz Other Clinician: Referring Chaney Maclaren: Treating Kimari Lienhard/Extender: Tharon Aquas in Treatment: 1 Wound Status Wound Number: 9 Primary Skin  T ear Etiology: Wound Location: Right, Medial Knee Wound Open Wounding Event: Laceration Status: Date Acquired: 08/19/2022 Comorbid Cataracts, Anemia, Sleep Apnea, Arrhythmia, Congestive Heart Weeks Of Treatment: 1 History: Failure, Coronary Artery Disease, Hypertension, Type II Diabetes, Clustered Wound: No Osteoarthritis, Neuropathy, Received Chemotherapy Photos Wound Measurements Length: (cm) 1.1 Width: (cm) 1.5 Depth: (cm) 0.1 Area: (cm) 1.296 Volume: (cm) 0.13 % Reduction in Area: 17.5% % Reduction in Volume: 17.2% Epithelialization: Small (1-33%) Tunneling: No Undermining: No Wound Description Classification: Full Thickness Without Exposed Support Structures Wound Margin: Distinct, outline attached Exudate Amount: Medium Exudate Type: Serosanguineous Exudate Color: red, brown Foul Odor After Cleansing: No Slough/Fibrino No Wound Bed Granulation Amount: Large (67-100%) Exposed Structure Granulation Quality: Red Fascia Exposed: No Necrotic Amount: None Present (0%) Fat Layer (Subcutaneous Tissue) Exposed: Yes Tendon Exposed: No Muscle Exposed: No Joint Exposed: No Bone Exposed: No Periwound Skin Texture Texture Color No Abnormalities Noted: Yes No Abnormalities Noted: Yes Moisture Temperature / Pain No Abnormalities Noted: Yes Temperature: No Abnormality Treatment Notes Wound #9 (Knee) Wound Laterality: Right, Medial Cleanser Soap and  Water Discharge Instruction: May shower and wash wound with dial antibacterial soap and water prior to dressing change. Peri-Wound Care ANEEL, STEFANELLI ([id number]) [id number] Page 11 of 11 Topical Primary Dressing Xeroform Occlusive Gauze Dressing, 4x4 in Discharge Instruction: Apply to wound bed as instructed Secondary Dressing ALLEVYN Gentle Border, 3x3 (in/in) Discharge Instruction: Apply over primary dressing as directed. Secured With Compression Wrap Compression  Stockings Environmental education officer) Signed: 09/12/2022 4:01:22 PM By: Adline Peals Entered By: Adline Peals on 09/12/2022 13:35:09 -------------------------------------------------------------------------------- Vitals Details Patient Name: Date of Service: [name], [name] 09/12/2022 1:30 PM Medical Record Number: [id number] Patient Account Number: [id number] Date of Birth/Sex: Treating RN: [birth year] (82 y.o. M) Primary Care Julene Rahn: Roma Schanz Other Clinician: Referring Kesi Perrow: Treating Jabaree Mercado/Extender: Tharon Aquas in Treatment: 1 Vital Signs Time Taken: 01:21 Temperature (F): 97.7 Height (in): 72 Pulse (bpm): 67 Weight (lbs): 226 Respiratory Rate (breaths/min): 20 Body Mass Index (BMI): 30.6 Blood Pressure (mmHg): 114/68 Reference Range: 80 - 120 mg / dl Electronic Signature(s) Signed: 09/12/2022 3:18:15 PM By: Worthy Rancher Entered By: Worthy Rancher on 09/12/2022 13:22:28

## 2022-09-16 ENCOUNTER — Ambulatory Visit (INDEPENDENT_AMBULATORY_CARE_PROVIDER_SITE_OTHER): Payer: Medicare Other | Admitting: Dermatology

## 2022-09-16 VITALS — BP 132/81 | HR 59

## 2022-09-16 DIAGNOSIS — Z7189 Other specified counseling: Secondary | ICD-10-CM | POA: Diagnosis not present

## 2022-09-16 DIAGNOSIS — L570 Actinic keratosis: Secondary | ICD-10-CM

## 2022-09-16 DIAGNOSIS — Z85828 Personal history of other malignant neoplasm of skin: Secondary | ICD-10-CM

## 2022-09-16 DIAGNOSIS — Z5111 Encounter for antineoplastic chemotherapy: Secondary | ICD-10-CM | POA: Diagnosis not present

## 2022-09-16 DIAGNOSIS — L578 Other skin changes due to chronic exposure to nonionizing radiation: Secondary | ICD-10-CM

## 2022-09-16 NOTE — Patient Instructions (Addendum)
Cryotherapy Aftercare  Wash gently with soap and water everyday.   Apply Vaseline and Band-Aid daily until healed.    Levulan/PDT Treatment Common Side Effects  - Burning/stinging, which may be severe and last up to 24-72 hours after your treatment  - Redness, swelling and/or peeling which may last up to 4 weeks  - Scaling/crusting which may last up to 2 weeks  - Sun sensitivity (you MUST avoid sun exposure for 48-72 hours after treatment)  Care Instructions  - Okay to wash with soap and water and shampoo as normal  - If needed, you can do a cold compress (ex. Ice packs) for comfort  - If okay with your Primary Doctor, you may use analgesics such as Tylenol every 4-6 hours, not to exceed recommended dose  - You may apply Cerave Healing Ointment, Vaseline or Aquaphor  - If you have a lot of swelling you may take a Benadryl to help with this (this may cause drowsiness)  Sun Precautions  - Wear a wide brim hat for the next week if outside  - Wear a sunblock with zinc or titanium dioxide at least SPF 50 daily   We will recheck you in 10-12 weeks. If any problems, please call the office and ask to speak with a nurse.    Due to recent changes in healthcare laws, you may see results of your pathology and/or laboratory studies on MyChart before the doctors have had a chance to review them. We understand that in some cases there may be results that are confusing or concerning to you. Please understand that not all results are received at the same time and often the doctors may need to interpret multiple results in order to provide you with the best plan of care or course of treatment. Therefore, we ask that you please give [place] 2 business days to thoroughly review all your results before contacting the office for clarification. Should we see a critical lab result, you will be contacted sooner.   If You Need Anything After Your Visit  If you have any questions or concerns for your  doctor, please call our main line at [phone] and press option 4 to reach your doctor's medical assistant. If no one answers, please leave a voicemail as directed and we will return your call as soon as possible. Messages left after 4 pm will be answered the following business day.   You may also send [place] a message via [place]. We typically respond to MyChart messages within 1-2 business days.  For prescription refills, please ask your pharmacy to contact our office. Our fax number is [phone].  If you have an urgent issue when the clinic is closed that cannot wait until the next business day, you can page your doctor at the number below.    Please note that while we do our best to be available for urgent issues outside of office hours, we are not available 24/7.   If you have an urgent issue and are unable to reach [place], you may choose to seek medical care at your doctor's office, retail clinic, urgent care center, or emergency room.  If you have a medical emergency, please immediately call 911 or go to the emergency department.  Pager Numbers  - Dr. Nehemiah Massed: [phone]  - Dr. Laurence Ferrari: [phone]  - Dr. Nicole Kindred: [phone]  In the event of inclement weather, please call our main line at [phone] for an update on the status of any delays or closures.  Dermatology  Medication Tips: Please keep the boxes that topical medications come in in order to help keep track of the instructions about where and how to use these. Pharmacies typically print the medication instructions only on the boxes and not directly on the medication tubes.   If your medication is too expensive, please contact our office at [phone] option 4 or send [place] a message through [place].   We are unable to tell what your co-pay for medications will be in advance as this is different depending on your insurance coverage. However, we may be able to find a substitute medication at lower cost or fill out paperwork  to get insurance to cover a needed medication.   If a prior authorization is required to get your medication covered by your insurance company, please allow [place] 1-2 business days to complete this process.  Drug prices often vary depending on where the prescription is filled and some pharmacies may offer cheaper prices.  The website www.goodrx.com contains coupons for medications through different pharmacies. The prices here do not account for what the cost may be with help from insurance (it may be cheaper with your insurance), but the website can give you the price if you did not use any insurance.  - You can print the associated coupon and take it with your prescription to the pharmacy.  - You may also stop by our office during regular business hours and pick up a GoodRx coupon card.  - If you need your prescription sent electronically to a different pharmacy, notify our office through [facility] or by phone at [phone] option 4.     Si Usted Necesita Algo Despus de Su Visita  Tambin puede enviarnos un mensaje a travs de Pharmacist, community. Por lo general respondemos a los mensajes de MyChart en el transcurso de 1 a 2 das hbiles.  Para renovar recetas, por favor pida a su farmacia que se ponga en contacto con nuestra oficina. Harland Dingwall de fax es [place] [phone].  Si tiene un asunto urgente cuando la clnica est cerrada y que no puede esperar hasta el siguiente da hbil, puede llamar/localizar a su doctor(a) al nmero que aparece a continuacin.   Por favor, tenga en cuenta que aunque hacemos todo lo posible para estar disponibles para asuntos urgentes fuera del horario de [place], no estamos disponibles las 24 horas del da, los 7 das de la [place].   Si tiene un problema urgente y no puede comunicarse con nosotros, puede optar por buscar atencin mdica  en el consultorio de su doctor(a), en una clnica privada, en un centro de atencin urgente o en una sala de  emergencias.  Si tiene Engineering geologist, por favor llame inmediatamente al 911 o vaya a la sala de emergencias.  Nmeros de bper  - Dr. Nehemiah Massed: [phone]  - Dra. Moye: [phone]  - Dra. Nicole Kindred: [phone]  En caso de inclemencias del [place], por favor llame a Johnsie Kindred principal al [phone] para una actualizacin sobre el [place] de cualquier retraso o cierre.  Consejos para la medicacin en dermatologa: Por favor, guarde las cajas en las que vienen los medicamentos de uso tpico para ayudarle a seguir las instrucciones sobre dnde y cmo usarlos. Las farmacias generalmente imprimen las instrucciones del medicamento slo en las cajas y no directamente en los tubos del [place].   Si su medicamento es muy caro, por favor, pngase en contacto con Zigmund Daniel llamando al [phone] y presione la opcin 4 o envenos un mensaje  a travs de MyChart.   No podemos decirle cul ser su copago por los medicamentos por adelantado ya que esto es diferente dependiendo de la cobertura de su seguro. Sin embargo, es posible que podamos encontrar un medicamento sustituto a Electrical engineer un formulario para que el seguro cubra el medicamento que se considera necesario.   Si se requiere una autorizacin previa para que su compaa de seguros [place] su medicamento, por favor permtanos de 1 a 2 das hbiles para completar este proceso.  Los precios de los medicamentos varan con frecuencia dependiendo del Environmental consultant de dnde se surte la receta y alguna farmacias pueden ofrecer precios ms baratos.  El sitio web www.goodrx.com tiene cupones para medicamentos de Airline pilot. Los precios aqu no tienen en cuenta lo que podra costar con la ayuda del seguro (puede ser ms barato con su seguro), pero el sitio web puede darle el precio si no utiliz Research scientist (physical sciences).  - Puede imprimir el cupn correspondiente y llevarlo con su receta a la farmacia.  - Tambin puede pasar por  nuestra oficina durante el horario de atencin regular y Charity fundraiser una tarjeta de cupones de GoodRx.  - Si necesita que su receta se enve electrnicamente a una farmacia diferente, informe a nuestra oficina a travs de MyChart de [place] o por telfono llamando al [phone] y presione la opcin 4.

## 2022-09-16 NOTE — Progress Notes (Signed)
Follow-Up Visit   Subjective  Richard [name] is a 82 y.o. male who presents for the following: Follow-up.  Patient presents for follow-up Aks of the face and arms. He had a good reaction to PDT blue light treatment to face on 07/16/22. He has a history of multiple SCCs.   The following portions of the chart were reviewed this encounter and updated as appropriate:       Review of Systems:  No other skin or systemic complaints except as noted in HPI or Assessment and Plan.  Objective  Well appearing patient in no apparent distress; mood and affect are within normal limits.  A focused examination was performed including face, arms, hands. Relevant physical exam findings are noted in the Assessment and Plan.  R elbow x 1, R forearm x 4, R hand x 3, L forearm x 8, L hand x 1 (17) Keratotic papules.  R cheek x 6, R temple x 2, L temple x 1, L zygoma x 2, L upper forehead x 3 (14) Pink scaly macules.     Assessment & Plan  Actinic Damage with PreCancerous Actinic Keratoses Counseling for Topical Chemotherapy Management: Patient exhibits: - Severe, confluent actinic changes with pre-cancerous actinic keratoses that is secondary to cumulative UV radiation exposure over time - Condition that is severe; chronic, not at goal. - diffuse scaly erythematous macules and papules with underlying dyspigmentation - Discussed Prescription "Field Treatment" topical Chemotherapy for Severe, Chronic Confluent Actinic Changes with Pre-Cancerous Actinic Keratoses Field treatment involves treatment of an entire area of skin that has confluent Actinic Changes (Sun/ Ultraviolet light damage) and PreCancerous Actinic Keratoses by method of PhotoDynamic Therapy (PDT) and/or prescription Topical Chemotherapy agents such as 5-fluorouracil, 5-fluorouracil/calcipotriene, and/or imiquimod.  The purpose is to decrease the number of clinically evident and subclinical PreCancerous lesions to prevent progression to  development of skin cancer by chemically destroying early precancer changes that may or may not be visible.  It has been shown to reduce the risk of developing skin cancer in the treated area. As a result of treatment, redness, scaling, crusting, and open sores may occur during treatment course. One or more than one of these methods may be used and may have to be used several times to control, suppress and eliminate the PreCancerous changes. Discussed treatment course, expected reaction, and possible side effects. - Recommend daily broad spectrum sunscreen SPF 30+ to sun-exposed areas, reapply every 2 hours as needed.  - Staying in the shade or wearing long sleeves, sun glasses (UVA+UVB protection) and wide brim hats (4-inch brim around the entire circumference of the hat) are also recommended. - Call for new or changing lesions. -Good reaction from PDT 07/16/2022 blue light. -Plan 2nd treatment to face Red light with debridement.   Hypertrophic actinic keratosis (17) R elbow x 1, R forearm x 4, R hand x 3, L forearm x 8, L hand x 1  vs ISKS.  Actinic keratoses are precancerous spots that appear secondary to cumulative UV radiation exposure/sun exposure over time. They are chronic with expected duration over 1 year. A portion of actinic keratoses will progress to squamous cell carcinoma of the skin. It is not possible to reliably predict which spots will progress to skin cancer and so treatment is recommended to prevent development of skin cancer.  Recommend daily broad spectrum sunscreen SPF 30+ to sun-exposed areas, reapply every 2 hours as needed.  Recommend staying in the shade or wearing long sleeves, sun glasses (UVA+UVB protection) and  wide brim hats (4-inch brim around the entire circumference of the hat). Call for new or changing lesions.  Consider PDT blue light for arms  Destruction of lesion - R elbow x 1, R forearm x 4, R hand x 3, L forearm x 8, L hand x 1  Destruction method:  cryotherapy   Informed consent: discussed and consent obtained   Lesion destroyed using liquid nitrogen: Yes   Region frozen until ice ball extended beyond lesion: Yes   Outcome: patient tolerated procedure well with no complications   Post-procedure details: wound care instructions given   Additional details:  Prior to procedure, discussed risks of blister formation, small wound, skin dyspigmentation, or rare scar following cryotherapy. Recommend Vaseline ointment to treated areas while healing.   AK (actinic keratosis) (14) R cheek x 6, R temple x 2, L temple x 1, L zygoma x 2, L upper forehead x 3  Actinic keratoses are precancerous spots that appear secondary to cumulative UV radiation exposure/sun exposure over time. They are chronic with expected duration over 1 year. A portion of actinic keratoses will progress to squamous cell carcinoma of the skin. It is not possible to reliably predict which spots will progress to skin cancer and so treatment is recommended to prevent development of skin cancer.  Recommend daily broad spectrum sunscreen SPF 30+ to sun-exposed areas, reapply every 2 hours as needed.  Recommend staying in the shade or wearing long sleeves, sun glasses (UVA+UVB protection) and wide brim hats (4-inch brim around the entire circumference of the hat). Call for new or changing lesions.  Destruction of lesion - R cheek x 6, R temple x 2, L temple x 1, L zygoma x 2, L upper forehead x 3  Destruction method: cryotherapy   Informed consent: discussed and consent obtained   Lesion destroyed using liquid nitrogen: Yes   Region frozen until ice ball extended beyond lesion: Yes   Outcome: patient tolerated procedure well with no complications   Post-procedure details: wound care instructions given   Additional details:  Prior to procedure, discussed risks of blister formation, small wound, skin dyspigmentation, or rare scar following cryotherapy. Recommend Vaseline ointment to  treated areas while healing.    History of Squamous Cell Carcinoma of the Skin - No evidence of recurrence today - Recommend regular full body skin exams - Recommend daily broad spectrum sunscreen SPF 30+ to sun-exposed areas, reapply every 2 hours as needed.  - Call if any new or changing lesions are noted between office visits    Return for Red light w/debridement face, 3-4 mos Dr Chauncey Cruel Hx AKs, SCCs.  IJamesetta Orleans, CMA, am acting as scribe for Brendolyn Patty, MD .  Documentation: I have reviewed the above documentation for accuracy and completeness, and I agree with the above.  Brendolyn Patty MD

## 2022-09-17 ENCOUNTER — Ambulatory Visit (INDEPENDENT_AMBULATORY_CARE_PROVIDER_SITE_OTHER): Payer: Medicare Other | Admitting: *Deleted

## 2022-09-17 DIAGNOSIS — Z0000 Encounter for general adult medical examination without abnormal findings: Secondary | ICD-10-CM | POA: Diagnosis not present

## 2022-09-17 NOTE — Patient Instructions (Signed)
Richard [name] , Thank you for taking time to come for your Medicare Wellness Visit. I appreciate your ongoing commitment to your health goals. Please review the following plan we discussed and let me know if I can assist you in the future.     This is a list of the screening recommended for you and due dates:  Health Maintenance  Topic Date Due   COVID-19 Vaccine (4 - 2023-24 season) 03/01/2022   Yearly kidney health urinalysis for diabetes  07/26/2022   Hemoglobin A1C  02/26/2023   Complete foot exam   03/13/2023   Eye exam for diabetics  04/12/2023   Yearly kidney function blood test for diabetes  08/29/2023   Medicare Annual Wellness Visit  09/17/2023   DTaP/Tdap/Td vaccine (7 - Tdap) 09/30/2027   Pneumonia Vaccine  Completed   Flu Shot  Completed   Zoster (Shingles) Vaccine  Completed   HPV Vaccine  Aged Out   Colon Cancer Screening  Discontinued    Next appointment: Follow up in one year for your annual wellness visit.   Preventive Care 85 Years and Older, Male Preventive care refers to lifestyle choices and visits with your health care provider that can promote health and wellness. What does preventive care include? A yearly physical exam. This is also called an annual well check. Dental exams once or twice a year. Routine eye exams. Ask your health care provider how often you should have your eyes checked. Personal lifestyle choices, including: Daily care of your teeth and gums. Regular physical activity. Eating a healthy diet. Avoiding tobacco and drug use. Limiting alcohol use. Practicing safe sex. Taking low doses of aspirin every day. Taking vitamin and mineral supplements as recommended by your health care provider. What happens during an annual well check? The services and screenings done by your health care provider during your annual well check will depend on your age, overall health, lifestyle risk factors, and family history of disease. Counseling  Your  health care provider may ask you questions about your: Alcohol use. Tobacco use. Drug use. Emotional well-being. Home and relationship well-being. Sexual activity. Eating habits. History of falls. Memory and ability to understand (cognition). Work and work Statistician. Screening  You may have the following tests or measurements: Height, weight, and BMI. Blood pressure. Lipid and cholesterol levels. These may be checked every 5 years, or more frequently if you are over 43 years old. Skin check. Lung cancer screening. You may have this screening every year starting at age 29 if you have a 30-pack-year history of smoking and currently smoke or have quit within the past 15 years. Fecal occult blood test (FOBT) of the stool. You may have this test every year starting at age 41. Flexible sigmoidoscopy or colonoscopy. You may have a sigmoidoscopy every 5 years or a colonoscopy every 10 years starting at age 67. Prostate cancer screening. Recommendations will vary depending on your family history and other risks. Hepatitis C blood test. Hepatitis B blood test. Sexually transmitted disease (STD) testing. Diabetes screening. This is done by checking your blood sugar (glucose) after you have not eaten for a while (fasting). You may have this done every 1-3 years. Abdominal aortic aneurysm (AAA) screening. You may need this if you are a current or former smoker. Osteoporosis. You may be screened starting at age 2 if you are at high risk. Talk with your health care provider about your test results, treatment options, and if necessary, the need for more tests. Vaccines  Your health care provider may recommend certain vaccines, such as: Influenza vaccine. This is recommended every year. Tetanus, diphtheria, and acellular pertussis (Tdap, Td) vaccine. You may need a Td booster every 10 years. Zoster vaccine. You may need this after age 66. Pneumococcal 13-valent conjugate (PCV13) vaccine. One dose  is recommended after age 58. Pneumococcal polysaccharide (PPSV23) vaccine. One dose is recommended after age 41. Talk to your health care provider about which screenings and vaccines you need and how often you need them. This information is not intended to replace advice given to you by your health care provider. Make sure you discuss any questions you have with your health care provider. Document Released: 07/14/2015 Document Revised: 03/06/2016 Document Reviewed: 04/18/2015 Elsevier Interactive Patient Education  2017 [place] Prevention in the Home Falls can cause injuries. They can happen to people of all ages. There are many things you can do to make your home safe and to help prevent falls. What can I do on the outside of my home? Regularly fix the edges of walkways and driveways and fix any cracks. Remove anything that might make you trip as you walk through a door, such as a raised step or threshold. Trim any bushes or trees on the path to your home. Use bright outdoor lighting. Clear any walking paths of anything that might make someone trip, such as rocks or tools. Regularly check to see if handrails are loose or broken. Make sure that both sides of any steps have handrails. Any raised decks and porches should have guardrails on the edges. Have any leaves, snow, or ice cleared regularly. Use sand or salt on walking paths during winter. Clean up any spills in your garage right away. This includes oil or grease spills. What can I do in the bathroom? Use night lights. Install grab bars by the toilet and in the tub and shower. Do not use towel bars as grab bars. Use non-skid mats or decals in the tub or shower. If you need to sit down in the shower, use a plastic, non-slip stool. Keep the floor dry. Clean up any water that spills on the floor as soon as it happens. Remove soap buildup in the tub or shower regularly. Attach bath mats securely with double-sided non-slip rug  tape. Do not have throw rugs and other things on the floor that can make you trip. What can I do in the bedroom? Use night lights. Make sure that you have a light by your bed that is easy to reach. Do not use any sheets or blankets that are too big for your bed. They should not hang down onto the floor. Have a firm chair that has side arms. You can use this for support while you get dressed. Do not have throw rugs and other things on the floor that can make you trip. What can I do in the kitchen? Clean up any spills right away. Avoid walking on wet floors. Keep items that you use a lot in easy-to-reach places. If you need to reach something above you, use a strong step stool that has a grab bar. Keep electrical cords out of the way. Do not use floor polish or wax that makes floors slippery. If you must use wax, use non-skid floor wax. Do not have throw rugs and other things on the floor that can make you trip. What can I do with my stairs? Do not leave any items on the stairs. Make sure that there are  handrails on both sides of the stairs and use them. Fix handrails that are broken or loose. Make sure that handrails are as long as the stairways. Check any carpeting to make sure that it is firmly attached to the stairs. Fix any carpet that is loose or worn. Avoid having throw rugs at the top or bottom of the stairs. If you do have throw rugs, attach them to the floor with carpet tape. Make sure that you have a light switch at the top of the stairs and the bottom of the stairs. If you do not have them, ask someone to add them for you. What else can I do to help prevent falls? Wear shoes that: Do not have high heels. Have rubber bottoms. Are comfortable and fit you well. Are closed at the toe. Do not wear sandals. If you use a stepladder: Make sure that it is fully opened. Do not climb a closed stepladder. Make sure that both sides of the stepladder are locked into place. Ask someone to  hold it for you, if possible. Clearly mark and make sure that you can see: Any grab bars or handrails. First and last steps. Where the edge of each step is. Use tools that help you move around (mobility aids) if they are needed. These include: Canes. Walkers. Scooters. Crutches. Turn on the lights when you go into a dark area. Replace any light bulbs as soon as they burn out. Set up your furniture so you have a clear path. Avoid moving your furniture around. If any of your floors are uneven, fix them. If there are any pets around you, be aware of where they are. Review your medicines with your doctor. Some medicines can make you feel dizzy. This can increase your chance of falling. Ask your doctor what other things that you can do to help prevent falls. This information is not intended to replace advice given to you by your health care provider. Make sure you discuss any questions you have with your health care provider. Document Released: 04/13/2009 Document Revised: 11/23/2015 Document Reviewed: 07/22/2014 Elsevier Interactive Patient Education  2017 [employer].

## 2022-09-17 NOTE — Progress Notes (Signed)
Subjective:   Richard [name] is a 82 y.o. male who presents for Medicare Annual/Subsequent preventive examination.  I connected with  Richard [name] on 09/17/22 by a audio enabled telemedicine application and verified that I am speaking with the correct person using two identifiers.  Patient Location: Home  Provider Location: Office/Clinic  I discussed the limitations of evaluation and management by telemedicine. The patient expressed understanding and agreed to proceed.   Review of Systems     Cardiac Risk Factors include: advanced age (>14men, >39 women);male gender;hypertension;dyslipidemia;diabetes mellitus;obesity (BMI >30kg/m2)     Objective:    There were no vitals filed for this visit. There is no height or weight on file to calculate BMI.     09/17/2022    2:18 PM 08/28/2022    1:55 PM 07/30/2022   11:59 AM 06/26/2022    1:36 PM 05/22/2022    1:15 PM 04/25/2022    5:12 PM 04/24/2022    7:25 PM  Advanced Directives  Does Patient Have a Medical Advance Directive? Yes Yes Yes Yes Yes  No  Type of Paramedic of [place];Living will [place];Living will [place];Living will [place];Living will [place];Living will    Does patient want to make changes to medical advance directive? No - Patient declined   No - Patient declined     Copy of [place] in Chart? Yes - validated most recent copy scanned in chart (See row information) Yes - validated most recent copy scanned in chart (See row information) Yes - validated most recent copy scanned in chart (See row information) Yes - validated most recent copy scanned in chart (See row information) No - copy requested    Would patient like information on creating a medical advance directive? No - Patient declined No - Patient declined No - Patient declined No - Patient declined No - Patient declined No - Patient  declined     Current Medications (verified) Outpatient Encounter Medications as of 09/17/2022  Medication Sig   acetaminophen (TYLENOL) 325 MG tablet Take 650 mg by mouth at bedtime as needed for moderate pain or headache.   allopurinol (ZYLOPRIM) 300 MG tablet Take 450 mg by mouth daily.   aluminum hydroxide-magnesium carbonate (GAVISCON) 95-358 MG/15ML SUSP Take 15 mLs by mouth as needed for indigestion or heartburn.   apixaban (ELIQUIS) 2.5 MG TABS tablet Take 1 tablet (2.5 mg total) by mouth 2 (two) times daily.   Ascorbic Acid (VITAMIN C PO) Take 1 tablet by mouth daily with breakfast.   atorvastatin (LIPITOR) 80 MG tablet TAKE 1 TABLET BY MOUTH EVERYDAY AT BEDTIME (Patient taking differently: Take 80 mg by mouth at bedtime.)   Azelastine HCl 137 MCG/SPRAY SOLN PLACE 2 SPRAYS INTO BOTH NOSTRILS AT BEDTIME AS NEEDED FOR RHINITIS OR ALLERGIES.   Coenzyme Q10 (CO Q-10 PO) Take 1 capsule by mouth daily.   Dulaglutide (TRULICITY) 3 0000000 SOPN Inject 3 mg into the skin once a week.   esomeprazole (NEXIUM) 40 MG capsule Take 1 capsule (40 mg total) by mouth daily. (Patient taking differently: Take 40 mg by mouth daily before breakfast.)   ezetimibe (ZETIA) 10 MG tablet TAKE 1 TABLET BY MOUTH EVERY DAY (Patient taking differently: Take 10 mg by mouth daily.)   FARXIGA 10 MG TABS tablet Take 10 mg by mouth in the morning.   fenofibrate 160 MG tablet TAKE 1 TABLET BY MOUTH EVERY DAY  folic acid (FOLVITE) 1 MG tablet TAKE 2 TABLETS BY MOUTH EVERY DAY   glimepiride (AMARYL) 2 MG tablet TAKE 1 TABLET (2 MG TOTAL) BY MOUTH IN THE MORNING AND AT BEDTIME.   glucose blood (FREESTYLE LITE) test strip USE TO TEST BLOOD SUGAR ONCE A DAY. DX CODE: E11.9   icosapent Ethyl (VASCEPA) 1 g capsule TAKE 2 CAPSULES BY MOUTH 2 TIMES DAILY.   Lancets (FREESTYLE) lancets USE ONCE A DAY TO CHECK BLOOD SUGAR.   Menthol-Camphor (ICY HOT ADVANCED PAIN RELIEF EX) Apply 1 application  topically daily as needed (to  painful sites).   metoprolol succinate (TOPROL-XL) 25 MG 24 hr tablet Take 1 tablet (25 mg total) by mouth daily.   nitroGLYCERIN (NITROSTAT) 0.4 MG SL tablet PLACE 1 TABLET UNDER THE TONGUE EVERY 5 MINUTES AS NEEDED FOR CHEST PAIN.   potassium chloride SA (KLOR-CON M20) 20 MEQ tablet Take 2 tablets (40 mEq total) by mouth daily.   pregabalin (LYRICA) 150 MG capsule TAKE 1 CAPSULE BY MOUTH TWICE A DAY   pregabalin (LYRICA) 150 MG capsule Take 1 capsule (150 mg total) by mouth 2 (two) times daily. (Patient not taking: Reported on 08/28/2022)   psyllium (METAMUCIL) 58.6 % powder Take 1 packet by mouth daily as needed (for constipation- mix and drink).   senna (SENOKOT) 8.6 MG TABS tablet Take 2 tablets (17.2 mg total) by mouth at bedtime. This is for constipation.  Stop taking if you start having diarrhea. (Patient taking differently: Take 2 tablets by mouth daily as needed for mild constipation. This is for constipation.  Stop taking if you start having diarrhea.)   torsemide (DEMADEX) 20 MG tablet Take 2 tablets (40 mg total) by mouth in the morning. Do not take on 01/24/2022.   No facility-administered encounter medications on file as of 09/17/2022.    Allergies (verified) Ace inhibitors, Benazepril, Entresto [sacubitril-valsartan], Hctz [hydrochlorothiazide], and Aspirin   History: Past Medical History:  Diagnosis Date   Anemia    Arthritis    hips   Axillary adenopathy 02/25/2017   Bradycardia    a. holter monitor has demonstrated HRs in 30s and [place]ach    CAD (coronary artery disease)    a. s/p CABG 2001  b.  07/28/2017 cath:   Severe three-vessel native CAD with total occlusion of LAD, ramus intermedius, first OM and RCA, patent RIMA to PDA, LIMA to LAD, sequential SVG to ramus intermedius and first OM.     Chronic lower back pain    Diffuse large B cell lymphoma (HCC)    Diverticulosis    Esophageal stricture    GERD (gastroesophageal reflux disease)    History of gout    HTN  (hypertension)    Mixed hyperlipidemia    OSA on CPAP    with 2L O2 at night   Pancytopenia ([place])    a. related to chemo therapy for B cell lymphoma   Peptic stricture of esophagus    Presence of permanent cardiac pacemaker    sees Dr. Valaria Good pacemaker   SCC (squamous cell carcinoma) 01/31/2021   R zygoma, ED[facility]   SCC (squamous cell carcinoma) 01/31/2021   L post ankle, ED[facility]   SCC (squamous cell carcinoma) 01/31/2021   L popliteal, EDC   Severe aortic stenosis    Spinal stenosis    Squamous cell carcinoma of skin 03/22/2009   Right mandible. SCCis, hypertrophic.    Squamous cell carcinoma of skin 12/25/2021   R lateral cheek, EDC   Squamous  cell carcinoma of skin 12/25/2021   R postauricular neck, EDC   Squamous cell carcinoma of skin 12/25/2021   R forearm sup, EDC   Squamous cell carcinoma of skin 12/25/2021   R wrist, EDC   Type II diabetes mellitus ([place])    Wears dentures    partial upper   Past Surgical History:  Procedure Laterality Date   APPENDECTOMY  ~ [place] Left 11/06/2016   Procedure: CATARACT EXTRACTION PHACO AND INTRAOCULAR LENS PLACEMENT ([place]);  Surgeon: Estill Cotta, MD;  Location: [facility];  Service: Ophthalmology;  Laterality: Left;  Lot # [id number] H [place]: 01:09.4 AP%:25.2 CDE: 30.64   CATARACT EXTRACTION W/PHACO Right 12/04/2016   Procedure: CATARACT EXTRACTION PHACO AND INTRAOCULAR LENS PLACEMENT (IOC);  Surgeon: Estill Cotta, MD;  Location: [facility];  Service: Ophthalmology;  Laterality: Right;  [place] 1:25.9 AP% 24.1 CDE 39.10 Fluid Pack lot # [id number]   COLONOSCOPY W/ BIOPSIES AND POLYPECTOMY  07/02/2011   CORONARY ANGIOPLASTY  07/02/1991   CORONARY ANGIOPLASTY WITH STENT PLACEMENT  05/31/1997   "1"   CORONARY ARTERY BYPASS GRAFT  03/01/2000   "CABG X5"   ECTROPION REPAIR Right 09/01/2018   Procedure: REPAIR OF ECTROPION BILATERAL upper and lower;  Surgeon: Karle Starch, MD;  Location:  [place];  Service: Ophthalmology;  Laterality: Right;  Diabetic - oral meds sleep apnea   ESOPHAGEAL DILATION  X 3-4   Dr. Lyla Son; "last one was in the 1990's"   ESOPHAGOGASTRODUODENOSCOPY     multiple   FLEXIBLE SIGMOIDOSCOPY     multiple   HYDRADENITIS EXCISION Left 02/25/2017   Procedure: EXCISION DEEP LEFT AXILLARY LYMPH NODE;  Surgeon: Fanny Skates, MD;  Location: [place];  Service: General;  Laterality: Left;   INTRAOPERATIVE TRANSTHORACIC ECHOCARDIOGRAM N/A 09/09/2017   Procedure: INTRAOPERATIVE TRANSTHORACIC ECHOCARDIOGRAM;  Surgeon: Burnell Blanks, MD;  Location: [place];  Service: Open Heart Surgery;  Laterality: N/A;   KNEE ARTHROSCOPY Left 07/01/2009   meniscus repair   LEFT HEART CATHETERIZATION WITH CORONARY/GRAFT ANGIOGRAM N/A 03/16/2014   Procedure: LEFT HEART CATHETERIZATION WITH Beatrix Fetters;  Surgeon: Burnell Blanks, MD;  Location[facility];  Service: Cardiovascular;  Laterality: N/A;   LUMBAR LAMINECTOMY/DECOMPRESSION MICRODISCECTOMY Right 06/17/2013   Procedure: LUMBAR LAMINECTOMY MICRODISCECTOMY L4-L5 RIGHT EXCISION OF SYNOVIAL CYST RIGHT   (1 LEVEL) RIGHT PARTIAL FACETECTOMY;  Surgeon: Tobi Bastos, MD;  Location: [facility];  Service: Orthopedics;  Laterality: Right;   MYELOGRAM  04/06/2013   lumbar, Dr Gladstone Lighter   ORBITAL LESION EXCISION Right 09/01/2018   Procedure: ORBITOTOMY WITHOUT BONE FLAP WITH REMOVAL OF LESION RIGHT;  Surgeon: Karle Starch, MD;  Location: [place];  Service: Ophthalmology;  Laterality: Right;   PACEMAKER IMPLANT N/A 09/10/2017   Procedure: PACEMAKER IMPLANT;  Surgeon: Deboraha Sprang, MD;  Location: [place] CV LAB;  Service: Cardiovascular;  Laterality: N/A;   PANENDOSCOPY     PERIPHERAL VASCULAR BALLOON ANGIOPLASTY Left 03/16/2021   Procedure: PERIPHERAL VASCULAR BALLOON ANGIOPLASTY;  Surgeon: [name], MD;  Location: [place] CV LAB;  Service: Cardiovascular;   Laterality: Left;  subclavian  vein   PORTACATH PLACEMENT N/A 03/06/2017   Procedure: INSERTION PORT-A-CATH AND ASPIRATE SEROMA LEFT AXILLA;  Surgeon: Fanny Skates, MD;  Location: [place];  Service: General;  Laterality: N/A;   RIGHT/LEFT HEART CATH AND CORONARY/GRAFT ANGIOGRAPHY N/A 07/28/2017   Procedure: RIGHT/LEFT HEART CATH AND CORONARY/GRAFT ANGIOGRAPHY;  Surgeon: Sherren Mocha,  MD;  Location: [place] CV LAB;  Service: Cardiovascular;  Laterality: N/A;   SHOULDER SURGERY Right 08/30/2010   screws placed; "tendons tore off"   SKIN CANCER EXCISION Right    "neck"   TONSILLECTOMY  ~ [place] Left 06/29/2019   Procedure: TOTAL KNEE ARTHROPLASTY;  Surgeon: Paralee Cancel, MD;  Location: [facility];  Service: Orthopedics;  Laterality: Left;  70 mins   TRANSCATHETER AORTIC VALVE REPLACEMENT, TRANSFEMORAL N/A 09/09/2017   Procedure: TRANSCATHETER AORTIC VALVE REPLACEMENT, TRANSFEMORAL;  Surgeon: Burnell Blanks, MD;  Location: [place];  Service: Open Heart Surgery;  Laterality: N/A;   UPPER EXTREMITY VENOGRAPHY Left 02/15/2021   Procedure: CENTRAL VENO;  Surgeon: [name], MD;  Location: [place] CV LAB;  Service: Cardiovascular;  Laterality: Left;   UPPER EXTREMITY VENOGRAPHY N/A 03/16/2021   Procedure: UPPER EXTREMITY VENOGRAPHY;  Surgeon: [name], MD;  Location: [place] CV LAB;  Service: Cardiovascular;  Laterality: N/A;   UPPER GI ENDOSCOPY  07/02/2011   Gastritis; Dr Carlean Purl   VASECTOMY     wireless pacemaker placed     Family History  Problem Relation Age of Onset   Stroke Father    Hypertension Father    Pancreatic cancer Mother    Diabetes Maternal Grandmother    Stroke Maternal Grandmother    Heart attack Paternal Grandmother    Colon cancer Neg Hx    Esophageal cancer Neg Hx    Rectal cancer Neg Hx    Stomach cancer Neg Hx    Ulcers Neg Hx    Social History   Socioeconomic History   Marital status: Divorced     Spouse name: Not on file   Number of children: 2   Years of education: college   Highest education level: Not on file  Occupational History    Employer: RETIRED  Tobacco Use   Smoking status: Never   Smokeless tobacco: Never  Vaping Use   Vaping Use: Never used  Substance and Sexual Activity   Alcohol use: No    Alcohol/week: 0.0 standard drinks of alcohol    Comment: "last drink was in 2012"( 03/15/2014)   Drug use: No   Sexual activity: Not Currently  Other Topics Concern   Not on file  Social History Narrative   ** Merged History Encounter **       Divorced, lives with a roommate. 1 son one daughter 3-4 caffeinated beverages daily Right-handed. 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   Social Determinants of Health   Financial Resource Strain: Low Risk  (02/11/2022)   Overall Financial Resource Strain (CARDIA)    Difficulty of Paying Living Expenses: Not very hard  Food Insecurity: No Food Insecurity (04/25/2022)   Hunger Vital Sign    Worried About Running Out of Food in the Last Year: Never true    Ran Out of Food in the Last Year: Never true  Transportation Needs: No Transportation Needs (04/25/2022)   PRAPARE - Hydrologist (Medical): No    Lack of Transportation (Non-Medical): No  Physical Activity: Insufficiently Active (02/11/2022)   Exercise Vital Sign    Days of Exercise per Week: 5 days    Minutes of Exercise per Session: 10 min  Stress: Not on file  Social Connections: Moderately Isolated (09/06/2021)   Social Connection  and Isolation Panel [NHANES]    Frequency of Communication with Friends and Family: More than three times a week    Frequency of Social Gatherings with Friends and Family: More than three times a week    Attends Religious Services: More than 4 times per year    Active Member of [employer] or  Organizations: No    Attends Music therapist: Never    Marital Status: Divorced    Tobacco Counseling Counseling given: Not Answered   Clinical Intake:  Pre-visit preparation completed: Yes  Pain : No/denies pain  Nutritional Risks: Non-healing wound (see wound care) Diabetes: Yes CBG done?: No Did pt. bring in CBG monitor from home?: No  How often do you need to have someone help you when you read instructions, pamphlets, or other written materials from your doctor or pharmacy?: 1 - Never   Activities of Daily Living    09/17/2022    2:33 PM 04/25/2022    5:00 PM  In your present state of health, do you have any difficulty performing the following activities:  Hearing? 0 0  Vision? 0 1  Comment wears readers   Difficulty concentrating or making decisions? 1 0  Walking or climbing stairs? 0 1  Dressing or bathing? 0 0  Doing errands, shopping? 0 0  Preparing Food and eating ? N   Using the Toilet? N   In the past six months, have you accidently leaked urine? Y   Do you have problems with loss of bowel control? N   Managing your Medications? N   Managing your Finances? N   Housekeeping or managing your Housekeeping? N     Patient Care Team: Carollee Herter, Alferd Apa, DO as PCP - General (Family Medicine) Minus Breeding, MD as PCP - Cardiology (Cardiology) Deboraha Sprang, MD as PCP - Electrophysiology (Cardiology) Dingeldein, Remo Lipps, MD (Ophthalmology) Rigoberto Noel, MD as Consulting Physician (Pulmonary Disease) [name], MD as Consulting Physician (Oncology) Minus Breeding, MD as Consulting Physician (Cardiology) Parrett, Fonnie Mu, NP as Nurse Practitioner (Pulmonary Disease) [facility] (Dermatology) Latanya Maudlin, MD as Consulting Physician (Orthopedic Surgery) [place] [name], [place] (Pharmacist) Murlean Iba, MD (Nephrology) [name], MD as Referring Physician  (Radiology)  Indicate any recent Medical Services you may have received from other than [facility] providers in the past year (date may be approximate).     Assessment:   This is a routine wellness examination for [place].  Hearing/Vision screen No results found.  Dietary issues and exercise activities discussed: Current Exercise Habits: The patient does not participate in regular exercise at present (plays golf 3 times a week), Exercise limited by: cardiac condition(s)   Goals Addressed   None    Depression Screen    09/17/2022    2:32 PM 09/06/2021    1:07 PM 07/26/2021    8:35 AM 01/11/2020   11:43 AM 11/22/2019   10:42 AM 10/21/2018    4:10 PM 10/20/2018    2:28 PM  PHQ 2/9 Scores  PHQ - 2 Score 0 0 0 0 0 0 0    Fall Risk    09/17/2022    2:30 PM 09/06/2021    1:06 PM 06/19/2021    2:23 PM 01/11/2020   11:43 AM 11/22/2019   10:41 AM  [place] in the past year? 0 0 0 1 1  Number falls in past yr: 0 0 0 0 0  Injury with Fall?  0 0 0 0 1  Risk for fall due to : No Fall Risks No Fall Risks History of fall(s);Impaired mobility    Follow up Falls evaluation completed Falls evaluation completed Falls evaluation completed  Education provided;Falls prevention discussed    FALL RISK PREVENTION PERTAINING TO THE HOME:  Any stairs in or around the home? Yes  If so, are there any without handrails? No  Home free of loose throw rugs in walkways, pet beds, electrical cords, etc? Yes  Adequate lighting in your home to reduce risk of falls? Yes   ASSISTIVE DEVICES UTILIZED TO PREVENT FALLS:  Life alert? Yes  Use of a cane, walker or w/c? No  Grab bars in the bathroom? Yes  Shower chair or bench in shower? No  Elevated toilet seat or a handicapped toilet? No   TIMED UP AND GO:  Was the test performed?  No, audio visit .   Cognitive Function:    03/15/2016    2:34 PM  MMSE - Mini Mental State Exam  Orientation to time 5  Orientation to Place 5  Registration 3  Attention/  Calculation 5  Recall 2  Language- name 2 objects 2  Language- repeat 1  Language- follow 3 step command 3  Language- read & follow direction 1  Write a sentence 1  Copy design 1  Total score 29        09/17/2022    2:49 PM 09/06/2021    1:13 PM  6CIT Screen  What Year? 0 points 0 points  What month? 0 points 0 points  What time? 0 points 0 points  Count back from 20 0 points 0 points  Months in reverse 0 points 0 points  Repeat phrase 0 points 0 points  Total Score 0 points 0 points    Immunizations Immunization History  Administered Date(s) Administered   Fluad Quad(high Dose 65+) 03/21/2021   Influenza Split 06/12/2011, 05/31/2013   Influenza Whole 05/01/2012   Influenza, High Dose Seasonal PF 02/09/2015, 03/12/2017, 03/09/2018, 03/02/2020   Influenza-Unspecified 02/11/2014, 02/29/2016, 02/25/2019, 04/01/2022   Moderna Sars-Covid-2 Vaccination 08/30/2019, 09/30/2019, 04/21/2020   Pneumococcal Conjugate-13 09/18/2015   Pneumococcal Polysaccharide-23 06/20/2013   Td 03/20/2010, 03/16/2013, 09/29/2017   Td (Adult), 2 Lf Tetanus Toxid, Preservative Free 03/20/2010, 03/16/2013, 09/29/2017   Zoster Recombinat (Shingrix) 11/21/2016, 09/25/2017    TDAP status: Up to date  Flu Vaccine status: Up to date  Pneumococcal vaccine status: Up to date  Covid-19 vaccine status: Information provided on how to obtain vaccines.   Qualifies for Shingles Vaccine? Yes   Zostavax completed No   Shingrix Completed?: Yes  Screening Tests Health Maintenance  Topic Date Due   COVID-19 Vaccine (4 - 2023-24 season) 03/01/2022   Diabetic kidney evaluation - Urine ACR  07/26/2022   Medicare Annual Wellness (AWV)  09/07/2022   HEMOGLOBIN A1C  02/26/2023   FOOT EXAM  03/13/2023   OPHTHALMOLOGY EXAM  04/12/2023   Diabetic kidney evaluation - eGFR measurement  08/29/2023   DTaP/Tdap/Td (7 - Tdap) 09/30/2027   Pneumonia Vaccine 69+ Years old  Completed   INFLUENZA VACCINE  Completed    Zoster Vaccines- Shingrix  Completed   HPV VACCINES  Aged Out   COLONOSCOPY (Pts 45-29yrs Insurance coverage will need to be confirmed)  Discontinued    Health Maintenance  Health Maintenance Due  Topic Date Due   COVID-19 Vaccine (4 - 2023-24 season) 03/01/2022   Diabetic kidney evaluation - Urine ACR  07/26/2022   Medicare Annual  Wellness (AWV)  09/07/2022    Colorectal cancer screening: No longer required.   Lung Cancer Screening: (Low Dose CT Chest recommended if Age 18-80 years, 30 pack-year currently smoking OR have quit w/in 15years.) does not qualify.    Additional Screening:  Hepatitis C Screening: does not qualify  Vision Screening: Recommended annual ophthalmology exams for early detection of glaucoma and other disorders of the eye. Is the patient up to date with their annual eye exam?  Yes  Who is the provider or what is the name of the office in which the patient attends annual eye exams? [facility] If pt is not established with a provider, would they like to be referred to a provider to establish care? No .   Dental Screening: Recommended annual dental exams for proper oral hygiene  Community Resource Referral / Chronic Care Management: CRR required this visit?  No   CCM required this visit?  No      Plan:     I have personally reviewed and noted the following in the patient's chart:   Medical and social history Use of alcohol, tobacco or illicit drugs  Current medications and supplements including opioid prescriptions. Patient is not currently taking opioid prescriptions. Functional ability and status Nutritional status Physical activity Advanced directives List of other physicians Hospitalizations, surgeries, and ER visits in previous 12 months Vitals Screenings to include cognitive, depression, and falls Referrals and appointments  In addition, I have reviewed and discussed with patient certain preventive protocols, quality metrics, and  best practice recommendations. A written personalized care plan for preventive services as well as general preventive health recommendations were provided to patient.   Due to this being a telephonic visit, the after visit summary with patients personalized plan was offered to patient via mail or my-chart. Patient would like to access on my-chart.  Beatris Ship, [place]   09/17/2022   Nurse Notes: None

## 2022-09-20 ENCOUNTER — Other Ambulatory Visit: Payer: Self-pay | Admitting: Cardiology

## 2022-09-25 ENCOUNTER — Inpatient Hospital Stay (HOSPITAL_BASED_OUTPATIENT_CLINIC_OR_DEPARTMENT_OTHER): Payer: Medicare Other | Admitting: Family

## 2022-09-25 ENCOUNTER — Inpatient Hospital Stay: Payer: Medicare Other | Attending: Hematology & Oncology

## 2022-09-25 ENCOUNTER — Encounter: Payer: Self-pay | Admitting: Family

## 2022-09-25 ENCOUNTER — Inpatient Hospital Stay: Payer: Medicare Other

## 2022-09-25 ENCOUNTER — Other Ambulatory Visit (HOSPITAL_BASED_OUTPATIENT_CLINIC_OR_DEPARTMENT_OTHER): Payer: Self-pay

## 2022-09-25 VITALS — BP 132/73 | HR 68 | Temp 97.9°F | Resp 18 | Wt 233.1 lb

## 2022-09-25 DIAGNOSIS — D519 Vitamin B12 deficiency anemia, unspecified: Secondary | ICD-10-CM

## 2022-09-25 DIAGNOSIS — G629 Polyneuropathy, unspecified: Secondary | ICD-10-CM | POA: Insufficient documentation

## 2022-09-25 DIAGNOSIS — D510 Vitamin B12 deficiency anemia due to intrinsic factor deficiency: Secondary | ICD-10-CM | POA: Insufficient documentation

## 2022-09-25 DIAGNOSIS — D509 Iron deficiency anemia, unspecified: Secondary | ICD-10-CM | POA: Diagnosis not present

## 2022-09-25 DIAGNOSIS — C8332 Diffuse large B-cell lymphoma, intrathoracic lymph nodes: Secondary | ICD-10-CM | POA: Diagnosis not present

## 2022-09-25 DIAGNOSIS — D518 Other vitamin B12 deficiency anemias: Secondary | ICD-10-CM

## 2022-09-25 LAB — CMP (CANCER CENTER ONLY)
ALT: 31 U/L (ref 0–44)
AST: 22 U/L (ref 15–41)
Albumin: 4.5 g/dL (ref 3.5–5.0)
Alkaline Phosphatase: 61 U/L (ref 38–126)
Anion gap: 12 (ref 5–15)
BUN: 50 mg/dL — ABNORMAL HIGH (ref 8–23)
CO2: 27 mmol/L (ref 22–32)
Calcium: 9.5 mg/dL (ref 8.9–10.3)
Chloride: 104 mmol/L (ref 98–111)
Creatinine: 2.33 mg/dL — ABNORMAL HIGH (ref 0.61–1.24)
GFR, Estimated: 27 mL/min — ABNORMAL LOW (ref >60)
Glucose, Bld: 150 mg/dL — ABNORMAL HIGH (ref 70–99)
Potassium: 4.2 mmol/L (ref 3.5–5.1)
Sodium: 143 mmol/L (ref 135–145)
Total Bilirubin: 0.7 mg/dL (ref 0.3–1.2)
Total Protein: 6.7 g/dL (ref 6.5–8.1)

## 2022-09-25 LAB — CBC WITH DIFFERENTIAL (CANCER CENTER ONLY)
Abs Immature Granulocytes: 0.13 10*3/uL — ABNORMAL HIGH (ref 0.00–0.07)
Basophils Absolute: 0 10*3/uL (ref 0.0–0.1)
Basophils Relative: 0 %
Eosinophils Absolute: 0.1 10*3/uL (ref 0.0–0.5)
Eosinophils Relative: 1 %
HCT: 43.4 % (ref 39.0–52.0)
Hemoglobin: 14 g/dL (ref 13.0–17.0)
Immature Granulocytes: 2 %
Lymphocytes Relative: 17 %
Lymphs Abs: 1.4 10*3/uL (ref 0.7–4.0)
MCH: 29 pg (ref 26.0–34.0)
MCHC: 32.3 g/dL (ref 30.0–36.0)
MCV: 89.9 fL (ref 80.0–100.0)
Monocytes Absolute: 0.6 10*3/uL (ref 0.1–1.0)
Monocytes Relative: 8 %
Neutro Abs: 6.1 10*3/uL (ref 1.7–7.7)
Neutrophils Relative %: 72 %
Platelet Count: 155 10*3/uL (ref 150–400)
RBC: 4.83 MIL/uL (ref 4.22–5.81)
RDW: 16.3 % — ABNORMAL HIGH (ref 11.5–15.5)
WBC Count: 8.4 10*3/uL (ref 4.0–10.5)
nRBC: 0 % (ref 0.0–0.2)

## 2022-09-25 LAB — FERRITIN: Ferritin: 1483 ng/mL — ABNORMAL HIGH (ref 24–336)

## 2022-09-25 LAB — RETICULOCYTES
Immature Retic Fract: 24.7 % — ABNORMAL HIGH (ref 2.3–15.9)
RBC.: 4.84 MIL/uL (ref 4.22–5.81)
Retic Count, Absolute: 73.1 10*3/uL (ref 19.0–186.0)
Retic Ct Pct: 1.5 % (ref 0.4–3.1)

## 2022-09-25 LAB — LACTATE DEHYDROGENASE: LDH: 192 U/L (ref 98–192)

## 2022-09-25 MED ORDER — CYANOCOBALAMIN 1000 MCG/ML IJ SOLN
1000.0000 ug | Freq: Once | INTRAMUSCULAR | Status: AC
  Administered 2022-09-25: 1000 ug via INTRAMUSCULAR
  Filled 2022-09-25: qty 1

## 2022-09-25 NOTE — Progress Notes (Signed)
Hematology and Oncology Follow Up Visit  Richard [name] [id number] 06/27/1941 82 y.o. 09/25/2022   Principle Diagnosis:  Diffuse large cell non-Hodgkin's lymphoma (IPI = 3) - NOT "double hit" Pernicious anemia Iron deficiency secondary to bleeding   Past Therapy: R-[facility]OP - s/p cycle 8 - completed 08/2017   Current Therapy:        Vitamin B12 1 mg IM every month Xgeva 120 mg subcu q 3 months - next dose due in 09/2022 IV iron as indicated   Interim History:  Richard [name] is here today for follow-up and B12 injection. He is doing well and has no complaints at this time. He has been playing golf and denies fatigue.   He did have some spots on his arms removed by dermatology. He was advised to wear sunscreen when golfing.  No fever, chills, n/v, cough, rash, dizziness, SOB, chest pain, palpitations, abdominal pain or changes in bowel or bladder habits.  No swelling or tenderness in his extremities.  Neuropathy unchanged.  No falls or syncope reported.  Appetite and hydration are good. Weight is stable 233 lbs.   ECOG Performance Status: 1 - Symptomatic but completely ambulatory  Medications:  Allergies as of 09/25/2022       Reactions   Ace Inhibitors Swelling, Other (See Comments)   Angioedema   Benazepril Swelling, Other (See Comments)   Angioedema; he is not a candidate for any angiotensin receptor blockers because of this significant allergic reaction. Because of a history of documented adverse serious drug reaction;Medi Alert bracelet  is recommended   Entresto [sacubitril-valsartan] Swelling, Other (See Comments)   First-in-Class Angiotensin Receptor Neprilysin Inhibitor- Med was "red-flagged" by the patient's pharmacy for him to NOT take!   Hctz [hydrochlorothiazide] Anaphylaxis, Swelling   Tongue and lip swelling   Aspirin Other (See Comments)   Gastritis, can aspirin not take 325 mg aspirin        Medication List        Accurate as of September 25, 2022  2:04 PM. If  you have any questions, ask your nurse or doctor.          acetaminophen 325 MG tablet Commonly known as: TYLENOL Take 650 mg by mouth at bedtime as needed for moderate pain or headache.   allopurinol 300 MG tablet Commonly known as: ZYLOPRIM Take 450 mg by mouth daily.   aluminum hydroxide-magnesium carbonate 95-358 MG/15ML Susp Commonly known as: GAVISCON Take 15 mLs by mouth as needed for indigestion or heartburn.   apixaban 2.5 MG Tabs tablet Commonly known as: ELIQUIS Take 1 tablet (2.5 mg total) by mouth 2 (two) times daily.   atorvastatin 80 MG tablet Commonly known as: LIPITOR TAKE 1 TABLET BY MOUTH EVERYDAY AT BEDTIME What changed: See the new instructions.   Azelastine HCl 137 MCG/SPRAY Soln PLACE 2 SPRAYS INTO BOTH NOSTRILS AT BEDTIME AS NEEDED FOR RHINITIS OR ALLERGIES.   CO Q-10 PO Take 1 capsule by mouth daily.   esomeprazole 40 MG capsule Commonly known as: NexIUM Take 1 capsule (40 mg total) by mouth daily. What changed: when to take this   ezetimibe 10 MG tablet Commonly known as: ZETIA TAKE 1 TABLET BY MOUTH EVERY DAY   Farxiga 10 MG Tabs tablet Generic drug: dapagliflozin propanediol Take 10 mg by mouth in the morning.   fenofibrate 160 MG tablet TAKE 1 TABLET BY MOUTH EVERY DAY   folic acid 1 MG tablet Commonly known as: FOLVITE TAKE 2 TABLETS BY MOUTH EVERY DAY  freestyle lancets USE ONCE A DAY TO CHECK BLOOD SUGAR.   FREESTYLE LITE test strip Generic drug: glucose blood USE TO TEST BLOOD SUGAR ONCE A DAY. DX CODE: E11.9   glimepiride 2 MG tablet Commonly known as: AMARYL TAKE 1 TABLET (2 MG TOTAL) BY MOUTH IN THE MORNING AND AT BEDTIME.   ICY HOT ADVANCED PAIN RELIEF EX Apply 1 application  topically daily as needed (to painful sites).   metoprolol succinate 25 MG 24 hr tablet Commonly known as: TOPROL-XL Take 1 tablet (25 mg total) by mouth daily.   nitroGLYCERIN 0.4 MG SL tablet Commonly known as: NITROSTAT PLACE 1  TABLET UNDER THE TONGUE EVERY 5 MINUTES AS NEEDED FOR CHEST PAIN.   potassium chloride SA 20 MEQ tablet Commonly known as: Klor-Con M20 Take 2 tablets (40 mEq total) by mouth daily.   pregabalin 150 MG capsule Commonly known as: LYRICA TAKE 1 CAPSULE BY MOUTH TWICE A DAY   pregabalin 150 MG capsule Commonly known as: LYRICA Take 1 capsule (150 mg total) by mouth 2 (two) times daily.   psyllium 58.6 % powder Commonly known as: METAMUCIL Take 1 packet by mouth daily as needed (for constipation- mix and drink).   senna 8.6 MG Tabs tablet Commonly known as: SENOKOT Take 2 tablets (17.2 mg total) by mouth at bedtime. This is for constipation.  Stop taking if you start having diarrhea. What changed:  when to take this reasons to take this   torsemide 20 MG tablet Commonly known as: DEMADEX TAKE 2 TABLETS (40 MG TOTAL) BY MOUTH IN THE MORNING. DO NOT TAKE ON 01/24/2022.   Trulicity 3 0000000 Sopn Generic drug: Dulaglutide Inject 3 mg into the skin once a week.   Vascepa 1 g capsule Generic drug: icosapent Ethyl TAKE 2 CAPSULES BY MOUTH 2 TIMES DAILY.   VITAMIN C PO Take 1 tablet by mouth daily with breakfast.        Allergies:  Allergies  Allergen Reactions   Ace Inhibitors Swelling and Other (See Comments)    Angioedema    Benazepril Swelling and Other (See Comments)    Angioedema; he is not a candidate for any angiotensin receptor blockers because of this significant allergic reaction. Because of a history of documented adverse serious drug reaction;Medi Alert bracelet  is recommended   Entresto [Sacubitril-Valsartan] Swelling and Other (See Comments)    First-in-Class Angiotensin Receptor Neprilysin Inhibitor- Med was "red-flagged" by the patient's pharmacy for him to NOT take!   Hctz [Hydrochlorothiazide] Anaphylaxis and Swelling    Tongue and lip swelling    Aspirin Other (See Comments)    Gastritis, can aspirin not take 325 mg aspirin    Past Medical  History, Surgical history, Social history, and Family History were reviewed and updated.  Review of Systems: All other 10 point review of systems is negative.   Physical Exam:  weight is 233 lb 1.3 oz (105.7 kg). His oral temperature is 97.9 F (36.6 C). His blood pressure is 132/73 and his pulse is 68. His respiration is 18 and oxygen saturation is 95%.   Wt Readings from Last 3 Encounters:  09/25/22 233 lb 1.3 oz (105.7 kg)  08/28/22 237 lb (107.5 kg)  07/30/22 239 lb 1.9 oz (108.5 kg)    Ocular: Sclerae unicteric, pupils equal, round and reactive to light Ear-nose-throat: Oropharynx clear, dentition fair Lymphatic: No cervical or supraclavicular adenopathy Lungs no rales or rhonchi, good excursion bilaterally Heart regular rate and rhythm, no murmur appreciated Abd soft,  nontender, positive bowel sounds MSK no focal spinal tenderness, no joint edema Neuro: non-focal, well-oriented, appropriate affect Breasts: Deferred   Lab Results  Component Value Date   WBC 8.4 09/25/2022   HGB 14.0 09/25/2022   HCT 43.4 09/25/2022   MCV 89.9 09/25/2022   PLT 155 09/25/2022   Lab Results  Component Value Date   FERRITIN 1,391 (H) 08/28/2022   IRON 113 08/28/2022   TIBC 391 08/28/2022   UIBC 278 08/28/2022   IRONPCTSAT 29 08/28/2022   Lab Results  Component Value Date   RETICCTPCT 1.5 09/25/2022   RBC 4.84 09/25/2022   RETICCTABS 52.1 11/22/2011   No results found for: "KPAFRELGTCHN", "LAMBDASER", "[facility]" Lab Results  Component Value Date   IGA 159 03/09/2012   Lab Results  Component Value Date   ALBUMINELP 4.2 07/27/2018   MSPIKE Not Observed 07/27/2018     Chemistry      Component Value Date/Time   NA 137 08/28/2022 1340   NA 138 02/08/2022 0811   NA 144 06/27/2017 0857   K 4.9 08/28/2022 1340   K 3.9 06/27/2017 0857   CL 98 08/28/2022 1340   CL 103 06/27/2017 0857   CO2 28 08/28/2022 1340   CO2 26 06/27/2017 0857   BUN 65 (H) 08/28/2022 1340   BUN 46  (H) 02/08/2022 0811   BUN 12 06/27/2017 0857   CREATININE 2.17 (H) 08/28/2022 1340   CREATININE 1.65 (H) 04/18/2020 0956      Component Value Date/Time   CALCIUM 10.1 08/28/2022 1340   CALCIUM 9.2 06/27/2017 0857   ALKPHOS 60 08/28/2022 1340   ALKPHOS 128 (H) 06/27/2017 0857   AST 19 08/28/2022 1340   ALT 27 08/28/2022 1340   ALT 24 06/27/2017 0857   BILITOT 0.6 08/28/2022 1340       Impression and Plan: Mr. Conger is a very pleasant 82 yo caucasian gentleman with diffuse large B-cell lymphoma (not "double hit" lymphoma). He completed treatment in February 2019.  Iron studies pending.  B 12 given today as planned.  Follow-up in 1 month.   Lottie Dawson, NP 3/27/20242:04 PM

## 2022-09-25 NOTE — Patient Instructions (Signed)
Vitamin B12 Deficiency Vitamin B12 deficiency means that your body does not have enough vitamin B12. The body needs this important vitamin: To make red blood cells. To make genes (DNA). To help the nerves work. If you do not have enough vitamin B12 in your body, you can have health problems, such as not having enough red blood cells in the blood (anemia). What are the causes? Not eating enough foods that contain vitamin B12. Not being able to take in (absorb) vitamin B12 from the food that you eat. Certain diseases. A condition in which the body does not make enough of a certain protein. This results in your body not taking in enough vitamin B12. Having a surgery in which part of the stomach or small intestine is taken out. Taking medicines that make it hard for the body to take in vitamin B12. These include: Heartburn medicines. Some medicines that are used to treat diabetes. What increases the risk? Being an older adult. Eating a vegetarian or vegan diet that does not include any foods that come from animals. Not eating enough foods that contain vitamin B12 while you are pregnant. Taking certain medicines. Having alcoholism. What are the signs or symptoms? In some cases, there are no symptoms. If the condition leads to too few blood cells or nerve damage, symptoms can occur, such as: Feeling weak or tired. Not being hungry. Losing feeling (numbness) or tingling in your hands and feet. Redness and burning of the tongue. Feeling sad (depressed). Confusion or memory problems. Trouble walking. If anemia is very bad, symptoms can include: Being short of breath. Being dizzy. Having a very fast heartbeat. How is this treated? Changing the way you eat and drink, such as: Eating more foods that contain vitamin B12. Drinking little or no alcohol. Getting vitamin B12 shots. Taking vitamin B12 supplements by mouth (orally). Your doctor will tell you the dose that is best for you. Follow  these instructions at home: Eating and drinking  Eat foods that come from animals and have a lot of vitamin B12 in them. These include: Meats and poultry. This includes beef, pork, chicken, turkey, and organ meats, such as liver. Seafood, such as clams, rainbow trout, salmon, tuna, and haddock. Eggs. Dairy foods such as milk, yogurt, and cheese. Eat breakfast cereals that have vitamin B12 added to them (are fortified). Check the label. The items listed above may not be a complete list of foods and beverages you can eat and drink. Contact a dietitian for more information. Alcohol use Do not drink alcohol if: Your doctor tells you not to drink. You are pregnant, may be pregnant, or are planning to become pregnant. If you drink alcohol: Limit how much you have to: 0-1 drink a day for women. 0-2 drinks a day for men. Know how much alcohol is in your drink. In the [place] one drink equals one 12 oz bottle of beer (355 mL), one 5 oz glass of wine (148 mL), or one 1 oz glass of hard liquor (44 mL). General instructions Get any vitamin B12 shots if told by your doctor. Take supplements only as told by your doctor. Follow the directions. Keep all follow-up visits. Contact a doctor if: Your symptoms come back. Your symptoms get worse or do not get better with treatment. Get help right away if: You have trouble breathing. You have a very fast heartbeat. You have chest pain. You get dizzy. You faint. These symptoms may be an emergency. Get help right away. Call 911.   Do not wait to see if the symptoms will go away. Do not drive yourself to the hospital. Summary Vitamin B12 deficiency means that your body is not getting enough of the vitamin. In some cases, there are no symptoms of this condition. Treatment may include making a change in the way you eat and drink, getting shots, or taking supplements. Eat foods that have vitamin B12 in them. This information is not intended to replace advice  given to you by your health care provider. Make sure you discuss any questions you have with your health care provider. Document Revised: 02/09/2021 Document Reviewed: 02/09/2021 Elsevier Patient Education  2023 [employer]  

## 2022-09-26 ENCOUNTER — Telehealth: Payer: Self-pay | Admitting: *Deleted

## 2022-09-26 ENCOUNTER — Encounter (HOSPITAL_BASED_OUTPATIENT_CLINIC_OR_DEPARTMENT_OTHER): Payer: Medicare Other | Admitting: Internal Medicine

## 2022-09-26 DIAGNOSIS — S61402A Unspecified open wound of left hand, initial encounter: Secondary | ICD-10-CM | POA: Diagnosis not present

## 2022-09-26 DIAGNOSIS — I110 Hypertensive heart disease with heart failure: Secondary | ICD-10-CM | POA: Diagnosis not present

## 2022-09-26 DIAGNOSIS — S81802A Unspecified open wound, left lower leg, initial encounter: Secondary | ICD-10-CM

## 2022-09-26 DIAGNOSIS — T798XXA Other early complications of trauma, initial encounter: Secondary | ICD-10-CM

## 2022-09-26 DIAGNOSIS — S81801A Unspecified open wound, right lower leg, initial encounter: Secondary | ICD-10-CM | POA: Diagnosis not present

## 2022-09-26 DIAGNOSIS — E1151 Type 2 diabetes mellitus with diabetic peripheral angiopathy without gangrene: Secondary | ICD-10-CM | POA: Diagnosis not present

## 2022-09-26 DIAGNOSIS — E1140 Type 2 diabetes mellitus with diabetic neuropathy, unspecified: Secondary | ICD-10-CM | POA: Diagnosis not present

## 2022-09-26 DIAGNOSIS — C8330 Diffuse large B-cell lymphoma, unspecified site: Secondary | ICD-10-CM | POA: Diagnosis not present

## 2022-09-26 LAB — IRON AND IRON BINDING CAPACITY (CC-WL,HP ONLY)
Iron: 78 ug/dL (ref 45–182)
Saturation Ratios: 22 % (ref 17.9–39.5)
TIBC: 363 ug/dL (ref 250–450)
UIBC: 285 ug/dL (ref 117–376)

## 2022-09-26 NOTE — Progress Notes (Signed)
MELVA, CISNEROS ([id number]) [id number] Page 1 of 7 Visit Report for 09/26/2022 Chief Complaint Document Details Patient Name: Date of Service: Richard [name] 09/26/2022 2:15 PM Medical Record Number: [id number] Patient Account Number: [id number] Date of Birth/Sex: Treating RN: 07/23/1940 (82 y.o. M) Primary Care Provider: Roma Schanz Other Clinician: Referring Provider: Treating Provider/Extender: Tharon Aquas in Treatment: 3 Information Obtained from: Patient Chief Complaint 01/12/2021; Wounds to his left upper extremity and bilateral lower extremities. 05/14/2022; bilateral lower extremity wounds, right plantar foot wound 09/05/2022; bilateral lower extremity wounds, left thumb wound Electronic Signature(s) Signed: 09/26/2022 3:02:48 PM By: Kalman Shan DO Entered By: Kalman Shan on 09/26/2022 15:00:20 -------------------------------------------------------------------------------- HPI Details Patient Name: Date of Service: Richard [name], Richard Larsson [name] 09/26/2022 2:15 PM Medical Record Number: [id number] Patient Account Number: [id number] Date of Birth/Sex: Treating RN: Nov 13, 1940 (82 y.o. M) Primary Care Provider: Roma Schanz Other Clinician: Referring Provider: Treating Provider/Extender: Tharon Aquas in Treatment: 3 History of Present Illness HPI Description: Admission 7/15 Richard [name] is an 82 year old male with a past medical history of type 2 diabetes on oral agents, diffuse large B-cell lymphoma, and coronary artery disease status post CABG that presents to the clinic for wounds to his left upper extremity and bilateral lower extremities. He states that he went to urgent care to be evaluated for a fever. And he was instructed to go to the ED to be further evaluated for possible sepsis. Unfortunately he passed out in his car After his appointment and was not discovered  for another 3 hours. He was eventually discovered and EMS had to take him out of his vehicle. This is when he developed abrasions to his left arm and legs. He has been using Xeroform to his wound beds every other day. He reports improvement to all the wounds except for the one on the back of his right leg. He denies pain or signs of infection. 7/22; patient presents for 1 week follow-up. He has no issues or complaints today. He has tolerated the compression wrap well on his right lower extremity. He has been able to do the dressing changes without issues to his left leg and left arm. His left arm wound has healed. He denies signs of infection. 7/29; patient presents for 1 week follow-up. He reports that his left leg wound is healed. He tolerated the compression wrap well on his right leg. He has no issues or complaints today. He denies signs of infection. 8/4; patient presents for 1 week follow-up. He continues to see improvement in wound healing to his right lower extremity. He has no issues or complaints today. He denies signs of infection. He does state that he went to the dermatologist and had some lesions removed on his face and left leg. He has been keeping these covered with a Band-Aid. 8/19; patient presents for follow-up. He is tolerated the compression wrap well. He has no issues or complaints today. He now only has 1 remaining wound. 9/1; patient presents for 1 week follow-up. He has tolerated the compression wrap well and has no issues or complaints today. He has been golfing 3 times weekly. He is in good spirits today. 9/15; patient presents for follow-up. He has no issues or complaints today. The wound is closed. Readmission 05/14/2022 Richard [name] is an 82 year old male with a past medical history of uncontrolled type 2 diabetes on oral agents, diffuse large B-cell lymphoma and coronary artery disease status post CABG that presents  the clinic for 3 wounds. He developed a right knee  wound when he fell out of his golf cart 3 weeks ago. He developed increased warmth and erythema to the right leg. While he was still waiting in the ED on 04/24/2022 for this issue he dropped his phone and created a wound to his left thigh. He has been using Xeroform to these areas. He ultimately required admission from the ED for right lower extremity cellulitis and was treated with Zyvox and Keflex. He also has a right plantar foot wound that started after having callus debridement by his podiatrist. He has had this wound for at least 6 weeks. For the foot wound he has not been keeping it covered and walks around barefoot. He has peripheral neuropathy However states that the wound site is painful. He denies any purulent drainage. He has swelling to this area. 11/21; patient presents for follow-up. He had an x-ray done at last clinic visit to the right foot that showed no focal bony erosion suggesting osteomyelitis. He completed his course of levofloxacin. He has been using his front offloading shoe. He has been using Xeroform to the knee wounds. He has no issues or complaints today. 11/28; patient presents for follow-up. His right knee wound has epithelialized. His right plantar foot wound remains closed. He has no issues or complaints today. Richard, [name] ([id number]) [id number] Page 2 of 7 09/05/2022 Richard [name] is an 82 year old male with a past medical history of type 2 diabetes on oral agents, diffuse large B-cell lymphoma and coronary artery disease status post CABG that presents the clinic for a 2-week history of wounds to his lower extremities bilaterally and his left thumb. He developed the wounds to his legs after his sister's dog jumped on him. He has been using Vaseline to the areas. He cut his thumb with a knife while cooking. He states he went to the ED and a used Dermabond on this. He currently denies signs of infection. 3/14; patient presents for  follow-up. He has been using Xeroform to his lower extremities bilaterally daily. He has been using antibiotic ointment to his left thumb. All wounds have shown improvement in healing. He has no issues or complaints today. 3/28; patient presents for follow-up. We have been using antibiotic ointment to the left thumb and Xeroform to the knees bilaterally. His wounds have healed. Electronic Signature(s) Signed: 09/26/2022 3:02:48 PM By: Kalman Shan DO Entered By: Kalman Shan on 09/26/2022 15:00:53 -------------------------------------------------------------------------------- Physical Exam Details Patient Name: Date of Service: Richard Killings [name] 09/26/2022 2:15 PM Medical Record Number: [id number] Patient Account Number: [id number] Date of Birth/Sex: Treating RN: 11/24/1940 (82 y.o. M) Primary Care Provider: Roma Schanz Other Clinician: Referring Provider: Treating Provider/Extender: Tharon Aquas in Treatment: 3 Constitutional respirations regular, non-labored and within target range for patient.[name] Psychiatric pleasant and cooperative. Notes T the lower extremities bilaterally there is epithelization to the previous wound sites. T the left thumb there is epithelization to the previous wound site. No o o signs of infection. Electronic Signature(s) Signed: 09/26/2022 3:02:48 PM By: Kalman Shan DO Entered By: Kalman Shan on 09/26/2022 15:01:28 -------------------------------------------------------------------------------- Physician Orders Details Patient Name: Date of Service: Richard [name], Richard Larsson [name] 09/26/2022 2:15 PM Medical Record Number: [id number] Patient Account Number: [id number] Date of Birth/Sex: Treating RN: [birth year] (82 y.o. [name] Primary Care Provider: Roma Schanz Other Clinician: Referring Provider: Treating Provider/Extender: Tharon Aquas in Treatment: 3 Verbal / Phone  Orders: No  Diagnosis Coding Discharge From [facility] Services Discharge from [place] - Call if any future wound care needs. Electronic Signature(s) Signed: 09/26/2022 3:02:48 PM By: Kalman Shan DO Entered By: Kalman Shan on 09/26/2022 15:01:35 -------------------------------------------------------------------------------- Problem List Details Patient Name: Date of Service: Richard [name], Richard Larsson [name] 09/26/2022 2:15 PM Medical Record Number: [id number] Patient Account Number: [id number] Date of Birth/Sex: Treating RN: 03-Oct-1940 (82 y.o. M) Primary Care Provider: Roma Schanz Other Clinician: Referring Provider: Treating Provider/Extender: Amerion, Thong ([id number]) [id number] Page 3 of 7 Weeks in Treatment: 3 Active Problems ICD-10 Encounter Code Description Active Date MDM Diagnosis S81.802A Unspecified open wound, left lower leg, initial encounter 09/05/2022 No Yes S81.801A Unspecified open wound, right lower leg, initial encounter 09/05/2022 No Yes S61.402A Unspecified open wound of left hand, initial encounter 09/05/2022 No Yes T79.8XXA Other early complications of trauma, initial encounter 09/05/2022 No Yes Inactive Problems Resolved Problems Electronic Signature(s) Signed: 09/26/2022 3:02:48 PM By: Kalman Shan DO Entered By: Kalman Shan on 09/26/2022 15:00:09 -------------------------------------------------------------------------------- Progress Note Details Patient Name: Date of Service: Richard Killings [name] 09/26/2022 2:15 PM Medical Record Number: [id number] Patient Account Number: [id number] Date of Birth/Sex: Treating RN: [birth year] (82 y.o. M) Primary Care Provider: Roma Schanz Other Clinician: Referring Provider: Treating Provider/Extender: Tharon Aquas in Treatment: 3 Subjective Chief Complaint Information obtained from Patient 01/12/2021;  Wounds to his left upper extremity and bilateral lower extremities. 05/14/2022; bilateral lower extremity wounds, right plantar foot wound 09/05/2022; bilateral lower extremity wounds, left thumb wound History of Present Illness (HPI) Admission 7/15 Richard [name] is an 82 year old male with a past medical history of type 2 diabetes on oral agents, diffuse large B-cell lymphoma, and coronary artery disease status post CABG that presents to the clinic for wounds to his left upper extremity and bilateral lower extremities. He states that he went to urgent care to be evaluated for a fever. And he was instructed to go to the ED to be further evaluated for possible sepsis. Unfortunately he passed out in his car After his appointment and was not discovered for another 3 hours. He was eventually discovered and EMS had to take him out of his vehicle. This is when he developed abrasions to his left arm and legs. He has been using Xeroform to his wound beds every other day. He reports improvement to all the wounds except for the one on the back of his right leg. He denies pain or signs of infection. 7/22; patient presents for 1 week follow-up. He has no issues or complaints today. He has tolerated the compression wrap well on his right lower extremity. He has been able to do the dressing changes without issues to his left leg and left arm. His left arm wound has healed. He denies signs of infection. 7/29; patient presents for 1 week follow-up. He reports that his left leg wound is healed. He tolerated the compression wrap well on his right leg. He has no issues or complaints today. He denies signs of infection. 8/4; patient presents for 1 week follow-up. He continues to see improvement in wound healing to his right lower extremity. He has no issues or complaints today. He denies signs of infection. He does state that he went to the dermatologist and had some lesions removed on his face and left leg. He has  been keeping these covered with a Band-Aid. 8/19; patient presents for follow-up. He is tolerated the compression wrap well. He has no issues  or complaints today. He now only has 1 remaining wound. 9/1; patient presents for 1 week follow-up. He has tolerated the compression wrap well and has no issues or complaints today. He has been golfing 3 times weekly. He is in good spirits today. 9/15; patient presents for follow-up. He has no issues or complaints today. The wound is closed. Readmission 05/14/2022 Richard, [name] ([id number]) [id number] Page 4 of 7 Richard [name] is an 82 year old male with a past medical history of uncontrolled type 2 diabetes on oral agents, diffuse large B-cell lymphoma and coronary artery disease status post CABG that presents the clinic for 3 wounds. He developed a right knee wound when he fell out of his golf cart 3 weeks ago. He developed increased warmth and erythema to the right leg. While he was still waiting in the ED on 04/24/2022 for this issue he dropped his phone and created a wound to his left thigh. He has been using Xeroform to these areas. He ultimately required admission from the ED for right lower extremity cellulitis and was treated with Zyvox and Keflex. He also has a right plantar foot wound that started after having callus debridement by his podiatrist. He has had this wound for at least 6 weeks. For the foot wound he has not been keeping it covered and walks around barefoot. He has peripheral neuropathy However states that the wound site is painful. He denies any purulent drainage. He has swelling to this area. 11/21; patient presents for follow-up. He had an x-ray done at last clinic visit to the right foot that showed no focal bony erosion suggesting osteomyelitis. He completed his course of levofloxacin. He has been using his front offloading shoe. He has been using Xeroform to the knee wounds. He has no issues  or complaints today. 11/28; patient presents for follow-up. His right knee wound has epithelialized. His right plantar foot wound remains closed. He has no issues or complaints today. 09/05/2022 Richard [name] is an 82 year old male with a past medical history of type 2 diabetes on oral agents, diffuse large B-cell lymphoma and coronary artery disease status post CABG that presents the clinic for a 2-week history of wounds to his lower extremities bilaterally and his left thumb. He developed the wounds to his legs after his sister's dog jumped on him. He has been using Vaseline to the areas. He cut his thumb with a knife while cooking. He states he went to the ED and a used Dermabond on this. He currently denies signs of infection. 3/14; patient presents for follow-up. He has been using Xeroform to his lower extremities bilaterally daily. He has been using antibiotic ointment to his left thumb. All wounds have shown improvement in healing. He has no issues or complaints today. 3/28; patient presents for follow-up. We have been using antibiotic ointment to the left thumb and Xeroform to the knees bilaterally. His wounds have healed. Patient History Information obtained from Patient. Family History Cancer - Mother, Diabetes - Maternal Grandparents, Heart Disease - Paternal Grandparents, Hypertension - Paternal Grandparents, Seizures - Child, Stroke - Father, No family history of Hereditary Spherocytosis, Kidney Disease, Lung Disease, Thyroid Problems, Tuberculosis. Social History Never smoker, Marital Status - Divorced, Alcohol Use - Never, Drug Use - No History, Caffeine Use - Rarely. Medical History Eyes Patient has history of Cataracts - Surgery Hematologic/Lymphatic Patient has history of Anemia Respiratory Patient has history of Sleep Apnea Cardiovascular Patient has history of Arrhythmia, Congestive Heart Failure, Coronary Artery Disease, Hypertension  Endocrine Patient has history  of Type II Diabetes Musculoskeletal Patient has history of Osteoarthritis Neurologic Patient has history of Neuropathy Oncologic Patient has history of Received Chemotherapy Denies history of Received Radiation Hospitalization/Surgery History - cellulitis right leg 10/25-10/28/2023. - balloon angioplasty 03/2021. - 12/29/2021 CHF. Medical A Surgical History Notes nd Musculoskeletal Spinal Stenosis Oncologic 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 Objective Constitutional respirations regular, non-labored and within target range for patient.. Vitals Time Taken: 2:31 PM, Height: 72 in, Weight: 226 lbs, BMI: 30.6, Temperature: 98.2 F, Pulse: 64 bpm, Respiratory Rate: 18 breaths/min, Blood Pressure: 112/67 mmHg. Psychiatric pleasant and cooperative. General Notes: T the lower extremities bilaterally there is epithelization to the previous wound sites. T the left thumb there is epithelization to the previous o o wound site. No signs of infection. Richard, [name] ([id number]) [id number] Page 5 of 7 Integumentary (Hair, Skin) Wound #10 status is Healed - Epithelialized. Original cause of wound was Laceration. The date acquired was: 08/29/2022. The wound has been in treatment 3 weeks. The wound is located on the Left Hand - 1st Digit. The wound measures 0cm length x 0cm width x 0cm depth; 0cm^2 area and 0cm^3 volume. There is Fat Layer (Subcutaneous Tissue) exposed. There is a medium amount of serosanguineous drainage noted. The wound margin is distinct with the outline attached to the wound base. There is large (67-100%) red granulation within the wound bed. There is a small (1-33%) amount of necrotic tissue within the wound bed including Eschar. The periwound skin appearance had no abnormalities noted for texture. The periwound skin appearance had no abnormalities noted for moisture. The periwound skin appearance had no abnormalities noted for color.  Periwound temperature was noted as No Abnormality. Wound #8 status is Healed - Epithelialized. Original cause of wound was Laceration. The date acquired was: 08/19/2022. The wound has been in treatment 3 weeks. The wound is located on the Left,Medial Knee. The wound measures 0cm length x 0cm width x 0cm depth; 0cm^2 area and 0cm^3 volume. There is Fat Layer (Subcutaneous Tissue) exposed. There is no tunneling or undermining noted. There is a medium amount of serosanguineous drainage noted. The wound margin is distinct with the outline attached to the wound base. There is no granulation within the wound bed. There is no necrotic tissue within the wound bed. The periwound skin appearance had no abnormalities noted for texture. The periwound skin appearance had no abnormalities noted for moisture. The periwound skin appearance had no abnormalities noted for color. Periwound temperature was noted as No Abnormality. Wound #9 status is Healed - Epithelialized. Original cause of wound was Laceration. The date acquired was: 08/19/2022. The wound has been in treatment 3 weeks. The wound is located on the Right,Medial Knee. The wound measures 0cm length x 0cm width x 0cm depth; 0cm^2 area and 0cm^3 volume. There is no tunneling or undermining noted. There is a medium amount of serosanguineous drainage noted. The wound margin is distinct with the outline attached to the wound base. There is no granulation within the wound bed. There is no necrotic tissue within the wound bed. The periwound skin appearance had no abnormalities noted for texture. The periwound skin appearance had no abnormalities noted for moisture. The periwound skin appearance had no abnormalities noted for color. Periwound temperature was noted as No Abnormality. Assessment Active Problems ICD-10 Unspecified open wound, left lower leg, initial encounter Unspecified open wound, right lower leg, initial encounter Unspecified open wound of left  hand, initial encounter  Other early complications of trauma, initial encounter Patient has done well with Xeroform and antibiotic ointment. His wounds of healed. He knows to call with any questions or concerns. He may follow-up as needed. Plan Discharge From [facility] Services: Discharge from [place] - Call if any future wound care needs. 1. Discharge from clinic due to closed wound 2. Follow-up as needed Electronic Signature(s) Signed: 09/26/2022 3:02:48 PM By: Kalman Shan DO Entered By: Kalman Shan on 09/26/2022 15:02:08 -------------------------------------------------------------------------------- HxROS Details Patient Name: Date of Service: Richard [name], Richard Larsson [name] 09/26/2022 2:15 PM Medical Record Number: [id number] Patient Account Number: [id number] Date of Birth/Sex: Treating RN: 11-20-1940 (82 y.o. M) Primary Care Provider: Roma Schanz Other Clinician: Referring Provider: Treating Provider/Extender: Tharon Aquas in Treatment: 3 Information Obtained From Patient Eyes Medical History: Positive for: Cataracts - Surgery Hematologic/Lymphatic Medical History: AVYANSH, BREER ([id number]) [phone].pdf Page 6 of 7 Positive for: Anemia Respiratory Medical History: Positive for: Sleep Apnea Cardiovascular Medical History: Positive for: Arrhythmia; Congestive Heart Failure; Coronary Artery Disease; Hypertension Endocrine Medical History: Positive for: Type II Diabetes Time with diabetes: 20 years Treated with: Oral agents Blood sugar tested every day: Yes Tested : Q am Musculoskeletal Medical History: Positive for: Osteoarthritis Past Medical History Notes: Spinal Stenosis Neurologic Medical History: Positive for: Neuropathy Oncologic Medical History: Positive for: Received Chemotherapy Negative for: Received Radiation Past Medical History Notes: Large B Cell Lymphoma skin Ca 6/23 right  cheek HBO Extended History Items Eyes: Cataracts Immunizations Pneumococcal Vaccine: Received Pneumococcal Vaccination: Yes Received Pneumococcal Vaccination On or After 60th Birthday: No Implantable Devices Yes Hospitalization / Surgery History Type of Hospitalization/Surgery cellulitis right leg 10/25-10/28/2023 balloon angioplasty 03/2021 12/29/2021 CHF Family and Social History Cancer: Yes - Mother; Diabetes: Yes - Maternal Grandparents; Heart Disease: Yes - Paternal Grandparents; Hereditary Spherocytosis: No; Hypertension: Yes - Paternal Grandparents; Kidney Disease: No; Lung Disease: No; Seizures: Yes - Child; Stroke: Yes - Father; Thyroid Problems: No; Tuberculosis: No; Never smoker; Marital Status - Divorced; Alcohol Use: Never; Drug Use: No History; Caffeine Use: Rarely; Financial Concerns: No; Food, Clothing or Shelter Needs: No; Support System Lacking: No; Transportation Concerns: No Electronic Signature(s) Signed: 09/26/2022 3:02:48 PM By: Kalman Shan DO Entered By: Kalman Shan on 09/26/2022 15:00:59 -------------------------------------------------------------------------------- SuperBill Details Patient Name: Date of Service: Richard Killings [name] 09/26/2022 [name] ([id number] Page 7 of 7 Medical Record Number: [id number] Patient Account Number: [id number] Date of Birth/Sex: Treating RN: 1941-02-08 (82 y.o. [name] Primary Care Provider: Roma Schanz Other Clinician: Referring Provider: Treating Provider/Extender: Tharon Aquas in Treatment: 3 Diagnosis Coding ICD-10 Codes Code Description [phone] Unspecified open wound, left lower leg, initial encounter S81.801A Unspecified open wound, right lower leg, initial encounter S61.402A Unspecified open wound of left hand, initial encounter T79.8XXA Other early complications of trauma, initial encounter Facility  Procedures : CPT4 Code: [id number] Description: 99213 - WOUND CARE VISIT-LEV 3 EST PT Modifier: Quantity: 1 Physician Procedures : CPT4 Code Description Modifier [id number] - WC PHYS LEVEL 3 - EST PT ICD-10 Diagnosis Description S81.802A Unspecified open wound, left lower leg, initial encounter S81.801A Unspecified open wound, right lower leg, initial encounter S61.402A  Unspecified open wound of left hand, initial encounter T79.8XXA Other early complications of trauma, initial encounter Quantity: 1 Electronic Signature(s) Signed: 09/26/2022 3:02:48 PM By: Kalman Shan DO Entered By: Kalman Shan on 09/26/2022 15:02:20

## 2022-09-26 NOTE — Telephone Encounter (Signed)
Per 09/25/22 LOS - called patient and lvm of upcoming appointment - requested call back to confirm.

## 2022-09-27 NOTE — Progress Notes (Signed)
AMERION, THONG ([id number]) [id number] Page 1 of 8 Visit Report for 09/26/2022 Arrival Information Details Patient Name: Date of Service: Richard Killings [name] 09/26/2022 2:15 PM Medical Record Number: [id number] Patient Account Number: [id number] Date of Birth/Sex: Treating RN: June 03, 1941 (82 y.o. M) Primary Care Terena Bohan: Roma Schanz Other Clinician: Referring Micaiah Litle: Treating Kaile Bixler/Extender: Tharon Aquas in Treatment: 3 Visit Information History Since Last Visit Added or deleted any medications: No Patient Arrived: Ambulatory Any new allergies or adverse reactions: No Arrival Time: 14:29 Had a fall or experienced change in No Accompanied By: self activities of daily living that may affect Transfer Assistance: None risk of falls: Patient Identification Verified: Yes Signs or symptoms of abuse/neglect since last visito No Secondary Verification Process Completed: Yes Hospitalized since last visit: No Patient Has Alerts: Yes Implantable device outside of the clinic excluding No Patient Alerts: Patient on Blood Thinner cellular tissue based products placed in the center since last visit: Pain Present Now: No Electronic Signature(s) Signed: 09/26/2022 4:59:34 PM By: Erenest Blank Entered By: Erenest Blank on 09/26/2022 14:31:04 -------------------------------------------------------------------------------- Clinic Level of Care Assessment Details Patient Name: Date of Service: Richard Killings [name] 09/26/2022 2:15 PM Medical Record Number: [id number] Patient Account Number: [id number] Date of Birth/Sex: Treating RN: 06/11/1941 (82 y.o. [name] Primary Care Raeleen Winstanley: Roma Schanz Other Clinician: Referring Korie Streat: Treating Sophee Mckimmy/Extender: Tharon Aquas in Treatment: 3 Clinic Level of Care Assessment Items TOOL 4 Quantity Score X- 1 0 Use when only an EandM is  performed on FOLLOW-UP visit ASSESSMENTS - Nursing Assessment / Reassessment X- 1 10 Reassessment of Co-morbidities (includes updates in patient status) X- 1 5 Reassessment of Adherence to Treatment Plan ASSESSMENTS - Wound and Skin A ssessment / Reassessment []  - 0 Simple Wound Assessment / Reassessment - one wound X- 3 5 Complex Wound Assessment / Reassessment - multiple wounds []  - 0 Dermatologic / Skin Assessment (not related to wound area) ASSESSMENTS - Focused Assessment []  - 0 Circumferential Edema Measurements - multi extremities []  - 0 Nutritional Assessment / Counseling / Intervention []  - 0 Lower Extremity Assessment (monofilament, tuning fork, pulses) []  - 0 Peripheral Arterial Disease Assessment (using hand held doppler) ASSESSMENTS - Ostomy and/or Continence Assessment and Care []  - 0 Incontinence Assessment and Management []  - 0 Ostomy Care Assessment and Management (repouching, etc.) PROCESS - Coordination of Care []  - 0 Simple Patient / Family Education for ongoing care ACEL, EASTER ([id number]) [id number] Page 2 of 8 X- 1 20 Complex (extensive) Patient / Family Education for ongoing care X- 1 10 Staff obtains Consents, Records, T Results / Process Orders est []  - 0 Staff telephones HHA, Nursing Homes / Clarify orders / etc []  - 0 Routine Transfer to another Facility (non-emergent condition) []  - 0 Routine Hospital Admission (non-emergent condition) []  - 0 New Admissions / Biomedical engineer / Ordering NPWT Apligraf, etc. , []  - 0 Emergency Hospital Admission (emergent condition) []  - 0 Simple Discharge Coordination X- 1 15 Complex (extensive) Discharge Coordination PROCESS - Special Needs []  - 0 Pediatric / Minor Patient Management []  - 0 Isolation Patient Management []  - 0 Hearing / Language / Visual special needs []  - 0 Assessment of Community assistance (transportation, D/C planning, etc.) []  -  0 Additional assistance / Altered mentation []  - 0 Support Surface(s) Assessment (bed, cushion, seat, etc.) INTERVENTIONS - Wound Cleansing / Measurement []  - 0 Simple Wound Cleansing - one wound X- 3 5 Complex Wound Cleansing -  multiple wounds X- 1 5 Wound Imaging (photographs - any number of wounds) []  - 0 Wound Tracing (instead of photographs) []  - 0 Simple Wound Measurement - one wound X- 3 5 Complex Wound Measurement - multiple wounds INTERVENTIONS - Wound Dressings []  - 0 Small Wound Dressing one or multiple wounds []  - 0 Medium Wound Dressing one or multiple wounds []  - 0 Large Wound Dressing one or multiple wounds []  - 0 Application of Medications - topical []  - 0 Application of Medications - injection INTERVENTIONS - Miscellaneous []  - 0 External ear exam []  - 0 Specimen Collection (cultures, biopsies, blood, body fluids, etc.) []  - 0 Specimen(s) / Culture(s) sent or taken to Lab for analysis []  - 0 Patient Transfer (multiple staff / Civil Service fast streamer / Similar devices) []  - 0 Simple Staple / Suture removal (25 or less) []  - 0 Complex Staple / Suture removal (26 or more) []  - 0 Hypo / Hyperglycemic Management (close monitor of Blood Glucose) []  - 0 Ankle / Brachial Index (ABI) - do not check if billed separately X- 1 5 Vital Signs Has the patient been seen at the hospital within the last three years: Yes Total Score: 115 Level Of Care: New/Established - Level 3 Electronic Signature(s) Signed: 09/26/2022 5:01:14 PM By: Deon Pilling RN, BSN Entered By: Deon Pilling on 09/26/2022 14:45:33 Richard [name] ([id number] Page 3 of 8 -------------------------------------------------------------------------------- Encounter Discharge Information Details Patient Name: Date of Service: Richard [name] 09/26/2022 2:15 PM Medical Record Number: [id number] Patient Account Number: [id number] Date of Birth/Sex: Treating  RN: 1941-03-12 (82 y.o. [name] Primary Care Keyandra Swenson: Roma Schanz Other Clinician: Referring Avelardo Reesman: Treating Roslyn Else/Extender: Tharon Aquas in Treatment: 3 Encounter Discharge Information Items Discharge Condition: Stable Ambulatory Status: Ambulatory Discharge Destination: Home Transportation: Private Auto Accompanied By: self Schedule Follow-up Appointment: No Clinical Summary of Care: Electronic Signature(s) Signed: 09/26/2022 5:01:14 PM By: Deon Pilling RN, BSN Entered By: Deon Pilling on 09/26/2022 14:46:45 -------------------------------------------------------------------------------- Lower Extremity Assessment Details Patient Name: Date of Service: Richard Killings [name] 09/26/2022 2:15 PM Medical Record Number: [id number] Patient Account Number: [id number] Date of Birth/Sex: Treating RN: [birth year] (82 y.o. M) Primary Care Margherita Collyer: Roma Schanz Other Clinician: Referring Charlie Seda: Treating Marialuiza Car/Extender: Tharon Aquas in Treatment: 3 Electronic Signature(s) Signed: 09/26/2022 4:59:34 PM By: Erenest Blank Entered By: Erenest Blank on 09/26/2022 14:32:46 -------------------------------------------------------------------------------- Multi Wound Chart Details Patient Name: Date of Service: Richard [name], Richard Larsson [name] 09/26/2022 2:15 PM Medical Record Number: [id number] Patient Account Number: [id number] Date of Birth/Sex: Treating RN: 12/27/1940 (82 y.o. M) Primary Care Yohann Curl: Roma Schanz Other Clinician: Referring Cheney Gosch: Treating Elianne Gubser/Extender: Tharon Aquas in Treatment: 3 Vital Signs Height(in): 72 Pulse(bpm): 64 Weight(lbs): 226 Blood Pressure(mmHg): 112/67 Body Mass Index(BMI): 30.6 Temperature(F): 98.2 Respiratory Rate(breaths/min): 18 [10:Photos:] Left Hand - 1st Digit Left, Medial Knee Right, Medial Knee Wound  Location: ALI, PASCASIO ([id number]) [phone].pdf Page 4 of 8 Laceration Laceration Laceration Wounding Event: Trauma, Other Skin T ear Skin T ear Primary Etiology: Cataracts, Anemia, Sleep Apnea, Cataracts, Anemia, Sleep Apnea, Cataracts, Anemia, Sleep Apnea, Comorbid History: Arrhythmia, Congestive Heart Failure, Arrhythmia, Congestive Heart Failure, Arrhythmia, Congestive Heart Failure, Coronary Artery Disease, Coronary Artery Disease, Coronary Artery Disease, Hypertension, Type II Diabetes, Hypertension, Type II Diabetes, Hypertension, Type II Diabetes, Osteoarthritis, Neuropathy, Received Osteoarthritis, Neuropathy, Received Osteoarthritis, Neuropathy, Received Chemotherapy Chemotherapy Chemotherapy 08/29/2022 08/19/2022 08/19/2022 Date Acquired: 3 3 3  Weeks of Treatment: Healed - Epithelialized Healed -  Epithelialized Healed - Epithelialized Wound Status: No No No Wound Recurrence: No Yes No Clustered Wound: N/A 2 N/A Clustered Quantity: 0x0x0 0x0x0 0x0x0 Measurements L x W x D (cm) 0 0 0 A (cm) : rea 0 0 0 Volume (cm) : 100.00% 100.00% 100.00% % Reduction in Area: 100.00% 100.00% 100.00% % Reduction in Volume: Full Thickness Without Exposed Full Thickness Without Exposed Full Thickness Without Exposed Classification: Support Structures Support Structures Support Structures Medium Medium Medium Exudate A mount: Serosanguineous Serosanguineous Serosanguineous Exudate Type: red, brown red, brown red, brown Exudate Color: Distinct, outline attached Distinct, outline attached Distinct, outline attached Wound Margin: Large (67-100%) None Present (0%) None Present (0%) Granulation Amount: Red N/A N/A Granulation Quality: Small (1-33%) None Present (0%) None Present (0%) Necrotic Amount: Eschar N/A N/A Necrotic Tissue: Fat Layer (Subcutaneous Tissue): Yes Fat Layer (Subcutaneous Tissue): Yes Fascia: No Exposed Structures: Fascia:  No Fascia: No Fat Layer (Subcutaneous Tissue): No Tendon: No Tendon: No Tendon: No Muscle: No Muscle: No Muscle: No Joint: No Joint: No Joint: No Bone: No Bone: No Bone: No Small (1-33%) Large (67-100%) Large (67-100%) Epithelialization: No Abnormalities Noted No Abnormalities Noted No Abnormalities Noted Periwound Skin Texture: No Abnormalities Noted No Abnormalities Noted No Abnormalities Noted Periwound Skin Moisture: No Abnormalities Noted No Abnormalities Noted No Abnormalities Noted Periwound Skin Color: No Abnormality No Abnormality No Abnormality Temperature: Treatment Notes Electronic Signature(s) Signed: 09/26/2022 3:02:48 PM By: Kalman Shan DO Entered By: Kalman Shan on 09/26/2022 15:00:13 -------------------------------------------------------------------------------- Multi-Disciplinary Care Plan Details Patient Name: Date of Service: Richard [name], Richard Larsson [name] 09/26/2022 2:15 PM Medical Record Number: [id number] Patient Account Number: [id number] Date of Birth/Sex: Treating RN: [birth year] (82 y.o. [name] Primary Care Aleiya Rye: Roma Schanz Other Clinician: Referring Ronne Savoia: Treating Vianca Bracher/Extender: Tharon Aquas in Treatment: 3 Active Inactive Electronic Signature(s) Signed: 09/26/2022 5:01:14 PM By: Deon Pilling RN, BSN Entered By: Deon Pilling on 09/26/2022 14:44:51 -------------------------------------------------------------------------------- Pain Assessment Details Patient Name: Date of Service: Richard Killings [name] 09/26/2022 2:15 PM Medical Record Number: [id number] Patient Account Number: [id number] Date of Birth/Sex: Treating RN: 02-09-1941 (82 y.o. M) Primary Care Raden Byington: Roma Schanz Other Clinician: Referring Arneisha Kincannon: Treating Kalena Mander/Extender: Tharon Aquas in Treatment: 3 Richard [name], Richard [name] ([id number]) [id number] Page 5 of  8 Active Problems Location of Pain Severity and Description of Pain Patient Has Paino No Site Locations Pain Management and Medication Current Pain Management: Electronic Signature(s) Signed: 09/26/2022 4:59:34 PM By: Erenest Blank Entered By: Erenest Blank on 09/26/2022 14:32:39 -------------------------------------------------------------------------------- Wound Assessment Details Patient Name: Date of Service: Richard Killings [name] 09/26/2022 2:15 PM Medical Record Number: [id number] Patient Account Number: [id number] Date of Birth/Sex: Treating RN: March 07, 1941 (82 y.o. [name], Tammi Klippel Primary Care Kiaria Quinnell: Roma Schanz Other Clinician: Referring Kissie Ziolkowski: Treating Kinya Meine/Extender: Tharon Aquas in Treatment: 3 Wound Status Wound Number: 10 Primary Trauma, Other Etiology: Wound Location: Left Hand - 1st Digit Wound Healed - Epithelialized Wounding Event: Laceration Status: Date Acquired: 08/29/2022 Comorbid Cataracts, Anemia, Sleep Apnea, Arrhythmia, Congestive Heart Weeks Of Treatment: 3 History: Failure, Coronary Artery Disease, Hypertension, Type II Diabetes, Clustered Wound: No Osteoarthritis, Neuropathy, Received Chemotherapy Photos Wound Measurements Length: (cm) Width: (cm) Depth: (cm) Area: (cm) Volume: (cm) Richard [name], Richard [name] ([id number]) Wound Description Classification: Full Thickness Without Exposed Sup Wound Margin: Distinct, outline attached Exudate Amount: Medium Exudate Type: Serosanguineous Exudate Color: red, brown port Structures Foul Odor After Cleansing: Slough/Fibrino 0 % Reduction in Area: 100% 0 % Reduction in Volume:  100% 0 Epithelialization: Small (1-33%) 0 0 [phone].pdf Page 6 of 8 No No Wound Bed Granulation Amount: Large (67-100%) Exposed Structure Granulation Quality: Red Fascia Exposed: No Necrotic Amount: Small (1-33%) Fat Layer (Subcutaneous Tissue) Exposed:  Yes Necrotic Quality: Eschar Tendon Exposed: No Muscle Exposed: No Joint Exposed: No Bone Exposed: No Periwound Skin Texture Texture Color No Abnormalities Noted: Yes No Abnormalities Noted: Yes Moisture Temperature / Pain No Abnormalities Noted: Yes Temperature: No Abnormality Electronic Signature(s) Signed: 09/26/2022 5:01:14 PM By: Deon Pilling RN, BSN Entered By: Deon Pilling on 09/26/2022 14:44:21 -------------------------------------------------------------------------------- Wound Assessment Details Patient Name: Date of Service: Richard Killings [name] 09/26/2022 2:15 PM Medical Record Number: [id number] Patient Account Number: [id number] Date of Birth/Sex: Treating RN: [birth year] (82 y.o. [name], Tammi Klippel Primary Care Loriann Bosserman: Roma Schanz Other Clinician: Referring Natalyia Innes: Treating Dynver Clemson/Extender: Tharon Aquas in Treatment: 3 Wound Status Wound Number: 8 Primary Skin T ear Etiology: Wound Location: Left, Medial Knee Wound Healed - Epithelialized Wounding Event: Laceration Status: Date Acquired: 08/19/2022 Comorbid Cataracts, Anemia, Sleep Apnea, Arrhythmia, Congestive Heart Weeks Of Treatment: 3 History: Failure, Coronary Artery Disease, Hypertension, Type II Diabetes, Clustered Wound: Yes Osteoarthritis, Neuropathy, Received Chemotherapy Photos Wound Measurements Length: (cm) Width: (cm) Depth: (cm) Clustered Quantity: Area: (cm) Volume: (cm) 0 % Reduction in Area: 100% 0 % Reduction in Volume: 100% 0 Epithelialization: Large (67-100%) 2 Tunneling: No 0 Undermining: No 0 Wound Description Classification: Full Thickness Without Exposed Support Structures Wound Margin: Distinct, outline attached Richard [name], Richard [name] ([id number]) Exudate Amount: Medium Exudate Type: Serosanguineous Exudate Color: red, brown Foul Odor After Cleansing: No Slough/Fibrino Yes [phone] Page 7 of 8 Wound  Bed Granulation Amount: None Present (0%) Exposed Structure Necrotic Amount: None Present (0%) Fascia Exposed: No Fat Layer (Subcutaneous Tissue) Exposed: Yes Tendon Exposed: No Muscle Exposed: No Joint Exposed: No Bone Exposed: No Periwound Skin Texture Texture Color No Abnormalities Noted: Yes No Abnormalities Noted: Yes Moisture Temperature / Pain No Abnormalities Noted: Yes Temperature: No Abnormality Electronic Signature(s) Signed: 09/26/2022 5:01:14 PM By: Deon Pilling RN, BSN Entered By: Deon Pilling on 09/26/2022 14:44:21 -------------------------------------------------------------------------------- Wound Assessment Details Patient Name: Date of Service: Richard Killings [name] 09/26/2022 2:15 PM Medical Record Number: [id number] Patient Account Number: [id number] Date of Birth/Sex: Treating RN: 1941/04/11 (82 y.o. [name], Tammi Klippel Primary Care Ash Mcelwain: Roma Schanz Other Clinician: Referring Seward Coran: Treating Burnadette Baskett/Extender: Tharon Aquas in Treatment: 3 Wound Status Wound Number: 9 Primary Skin T ear Etiology: Wound Location: Right, Medial Knee Wound Healed - Epithelialized Wounding Event: Laceration Status: Date Acquired: 08/19/2022 Comorbid Cataracts, Anemia, Sleep Apnea, Arrhythmia, Congestive Heart Weeks Of Treatment: 3 History: Failure, Coronary Artery Disease, Hypertension, Type II Diabetes, Clustered Wound: No Osteoarthritis, Neuropathy, Received Chemotherapy Photos Wound Measurements Length: (cm) Width: (cm) Depth: (cm) Area: (cm) Volume: (cm) 0 % Reduction in Area: 100% 0 % Reduction in Volume: 100% 0 Epithelialization: Large (67-100%) 0 Tunneling: No 0 Undermining: No Wound Description Classification: Full Thickness Without Exposed Support Structures Wound Margin: Distinct, outline attached Exudate Amount: Medium Exudate Type: Serosanguineous Exudate Color: red, brown Foul Odor After Cleansing:  No Slough/Fibrino No Wound Bed Richard [name], Richard [name] ([id number]) [id number] Page 8 of 8 Granulation Amount: None Present (0%) Exposed Structure Necrotic Amount: None Present (0%) Fascia Exposed: No Fat Layer (Subcutaneous Tissue) Exposed: No Tendon Exposed: No Muscle Exposed: No Joint Exposed: No Bone Exposed: No Periwound Skin Texture Texture Color No Abnormalities Noted: Yes No Abnormalities Noted: Yes Moisture Temperature / Pain No Abnormalities  Noted: Yes Temperature: No Abnormality Electronic Signature(s) Signed: 09/26/2022 5:01:14 PM By: Deon Pilling RN, BSN Entered By: Deon Pilling on 09/26/2022 14:44:21 -------------------------------------------------------------------------------- Vitals Details Patient Name: Date of Service: Richard [name], Richard Larsson [name] 09/26/2022 2:15 PM Medical Record Number: [id number] Patient Account Number: [id number] Date of Birth/Sex: Treating RN: 11/08/1940 (82 y.o. M) Primary Care Davey Bergsma: Roma Schanz Other Clinician: Referring Milayna Rotenberg: Treating Lurlie Wigen/Extender: Tharon Aquas in Treatment: 3 Vital Signs Time Taken: 14:31 Temperature (F): 98.2 Height (in): 72 Pulse (bpm): 64 Weight (lbs): 226 Respiratory Rate (breaths/min): 18 Body Mass Index (BMI): 30.6 Blood Pressure (mmHg): 112/67 Reference Range: 80 - 120 mg / dl Electronic Signature(s) Signed: 09/26/2022 4:59:34 PM By: Erenest Blank Entered By: Erenest Blank on 09/26/2022 14:32:29

## 2022-10-02 ENCOUNTER — Ambulatory Visit (INDEPENDENT_AMBULATORY_CARE_PROVIDER_SITE_OTHER): Payer: Medicare Other | Admitting: Podiatry

## 2022-10-02 DIAGNOSIS — B351 Tinea unguium: Secondary | ICD-10-CM | POA: Diagnosis not present

## 2022-10-02 DIAGNOSIS — E1142 Type 2 diabetes mellitus with diabetic polyneuropathy: Secondary | ICD-10-CM | POA: Diagnosis not present

## 2022-10-02 DIAGNOSIS — Q828 Other specified congenital malformations of skin: Secondary | ICD-10-CM | POA: Diagnosis not present

## 2022-10-02 DIAGNOSIS — M79675 Pain in left toe(s): Secondary | ICD-10-CM

## 2022-10-02 DIAGNOSIS — M79674 Pain in right toe(s): Secondary | ICD-10-CM | POA: Diagnosis not present

## 2022-10-02 NOTE — Progress Notes (Signed)
This patient returns to my office for at risk foot care.  This patient requires this care by a professional since this patient will be at risk due to having diabetic neuropathy  and coagulation defect.  Patient is taking eliquis. This patient is unable to cut nails himself since the patient cannot reach his nails.These nails are painful walking and wearing shoes.  Patient says the callus on his left forefoot has become painful when walking.  This patient presents for at risk foot care today.  General Appearance  Alert, conversant and in no acute stress.  Vascular  Dorsalis pedis and posterior tibial  pulses are  weakly palpable  bilaterally.  Capillary return is within normal limits  bilaterally. Temperature is within normal limits  bilaterally.  Neurologic  Senn-Weinstein monofilament wire test within normal limits  Left foot.  LOPS right foot is absent.  . Muscle power within normal limits bilaterally.  Nails Thick disfigured discolored nails with subungual debris  from hallux to fifth toes bilaterally. No evidence of bacterial infection or drainage bilaterally.  Orthopedic  No limitations of motion  feet .  No crepitus or effusions noted.  No bony pathology or digital deformities noted.  Plantar flexed fifth metatarsal B/L.  Skin  normotropic skin with noted bilaterally.  No signs of infections or ulcers noted  Porokeratosis sub 5th right foot.  Onychomycosis  Pain in right toes  Pain in left toes  Porokeratosis sub 5th left   Consent was obtained for treatment procedures.   Mechanical debridement of nails 1-5  bilaterally performed with a nail nipper.  Filed with dremel without incident. Debridement of callus sub 5th left foot. Padding added to hs shoe per patient request.   Return office visit   12 weeks.                  Told patient to return for periodic foot care and evaluation due to potential at risk complications.   Gardiner Barefoot DPM

## 2022-10-08 ENCOUNTER — Ambulatory Visit: Payer: Medicare Other | Admitting: Cardiology

## 2022-10-14 ENCOUNTER — Ambulatory Visit (INDEPENDENT_AMBULATORY_CARE_PROVIDER_SITE_OTHER): Payer: Medicare Other | Admitting: Pharmacist

## 2022-10-14 ENCOUNTER — Other Ambulatory Visit: Payer: Self-pay | Admitting: Family Medicine

## 2022-10-14 DIAGNOSIS — I129 Hypertensive chronic kidney disease with stage 1 through stage 4 chronic kidney disease, or unspecified chronic kidney disease: Secondary | ICD-10-CM

## 2022-10-14 DIAGNOSIS — E1122 Type 2 diabetes mellitus with diabetic chronic kidney disease: Secondary | ICD-10-CM

## 2022-10-14 DIAGNOSIS — N183 Chronic kidney disease, stage 3 unspecified: Secondary | ICD-10-CM

## 2022-10-14 DIAGNOSIS — E1142 Type 2 diabetes mellitus with diabetic polyneuropathy: Secondary | ICD-10-CM

## 2022-10-14 MED ORDER — PREGABALIN 100 MG PO CAPS: 100.0000 mg | ORAL_CAPSULE | Freq: Two times a day (BID) | ORAL | 2 refills | Status: DC

## 2022-10-14 MED ORDER — EZETIMIBE 10 MG PO TABS: 10.0000 mg | ORAL_TABLET | Freq: Every day | ORAL | 3 refills | Status: DC

## 2022-10-14 NOTE — Progress Notes (Signed)
Pharmacy Note  10/14/2022 Name: FIELD STANISZEWSKI MRN: [id number] DOB: [birth year]  Subjective: Richard [name] is a 82 y.o. year old male who is a primary care patient of Zola Button, Grayling Congress, DO. Clinical Pharmacist Practitioner referral was placed to assist with medication and diabetes management.    Engaged with patient by telephone for follow up visit today.  Type 2 DM -  Current therapy - Trulicity  weekly, glimepiride  - take 2 tablets =  daily and Farxiga  daily.  Past therapies: metformin stopped due to rising Scr.  Increased Trulicity from 1.5mg  to  08/28/2022. Patient is tolerating well. Blood glucose has improved from 190's.   Home blood glucose:  Highest - 124; lowest - 94  Wt Readings from Last 3 Encounters:  09/25/22 233 lb 1.3 oz (105.7 kg)  08/28/22 237 lb (107.5 kg)  07/30/22 239 lb 1.9 oz (108.5 kg)   Weight 1 year ago 10/26/2021 was 251lbs.   Diet - has been able to control appetite better since starting Trulicity. He is limiting sweets and sweetened beverages  Exercise - golfs 18 holes 2 or 3 times per week. States he feels great.   Medication management:  He was able to use [employer] funds to get all his cholesterol medications.  He states that he has been taking Lyrica  twice a day. We moved the morning dose to noon because he was having some daytime drowsiness. He states that drowsiness has improved but he would like to see if he could decrease dose of Lyrica.   Objective: Review of patient status, including review of consultants reports, laboratory and other test data, was performed as part of comprehensive.  Lab Results  Component Value Date   CREATININE 2.33 (H) 09/25/2022   CREATININE 2.17 (H) 08/28/2022   CREATININE 1.88 (H) 07/30/2022    Lab Results  Component Value Date   HGBA1C 8.1 (H) 08/28/2022       Component Value Date/Time   CHOL 122 04/30/2022 1153   CHOL 162 11/23/2020 0828   TRIG 225.0 (H) 04/30/2022 1153    HDL 22.70 (L) 04/30/2022 1153   HDL 38 (L) 11/23/2020 0828   CHOLHDL 5 04/30/2022 1153   VLDL 45.0 (H) 04/30/2022 1153   LDLCALC 76 01/25/2022 1218   LDLCALC 89 11/23/2020 0828   LDLCALC  04/18/2020 0956     Comment:     . LDL cholesterol not calculated. Triglyceride levels greater than 400 mg/dL invalidate calculated LDL results. . Reference range: <100 . Desirable range <100 mg/dL for primary prevention;   <70 mg/dL for patients with CHD or diabetic patients  with > or = 2 CHD risk factors. [name] LDL-C is now calculated using the Martin-Hopkins  calculation, which is a validated novel method providing  better accuracy than the Friedewald equation in the  estimation of LDL-C.  Horald Pollen et al. Lenox Ahr. 4401;027(25): 2061-2068  (http://education.[facility].com/faq/FAQ164)    LDLDIRECT 70.0 04/30/2022 1153     Clinical ASCVD: Yes  The ASCVD Risk score (Arnett DK, et al., 2019) failed to calculate for the following reasons:   The 2019 ASCVD risk score is only valid for ages 80 to 75   The patient has a prior MI or stroke diagnosis    BP Readings from Last 3 Encounters:  09/25/22 132/73  09/16/22 132/81  08/28/22 119/72     Allergies  Allergen Reactions   Ace Inhibitors Swelling and Other (See Comments)    Angioedema    Benazepril  Swelling and Other (See Comments)    Angioedema; he is not a candidate for any angiotensin receptor blockers because of this significant allergic reaction. Because of a history of documented adverse serious drug reaction;Medi Alert bracelet  is recommended   Entresto [Sacubitril-Valsartan] Swelling and Other (See Comments)    First-in-Class Angiotensin Receptor Neprilysin Inhibitor- Med was "red-flagged" by the patient's pharmacy for him to NOT take!   Hctz [Hydrochlorothiazide] Anaphylaxis and Swelling    Tongue and lip swelling    Aspirin Other (See Comments)    Gastritis, can aspirin not take 325 mg aspirin    Medications Reviewed  Today     Reviewed by Hyacinth Meeker, LPN (Licensed Practical Nurse) on 09/25/22 at 1355  Med List Status: <None>   Medication Order Taking? Sig Documenting Provider Last Dose Status Informant  acetaminophen (TYLENOL) 325 MG tablet [id number] Yes Take 650 mg by mouth at bedtime as needed for moderate pain or headache. [provider] Taking Active Self  allopurinol (ZYLOPRIM) 300 MG tablet [id number] Yes Take 450 mg by mouth daily. [provider] Taking Active Self           Med Note Antony Madura, Arn Medal   Wed Jan 16, 2022  2:57 PM) 1.5 tablets = 450 mg  aluminum hydroxide-magnesium carbonate (GAVISCON) 95-358 MG/15ML SUSP [id number] Yes Take 15 mLs by mouth as needed for indigestion or heartburn. [provider] Taking Active Self  apixaban (ELIQUIS) 2.5 MG TABS tablet [id number] Yes Take 1 tablet (2.5 mg total) by mouth 2 (two) times daily. Rollene Rotunda, MD Taking Active Self  Ascorbic Acid (VITAMIN C PO) [id number] Yes Take 1 tablet by mouth daily with breakfast. [provider] Taking Active Self  atorvastatin (LIPITOR) 80 MG tablet [id number] Yes TAKE 1 TABLET BY MOUTH EVERYDAY AT BEDTIME  Patient taking differently: Take 80 mg by mouth at bedtime.   Rollene Rotunda, MD Taking Active Self  Azelastine HCl 137 MCG/SPRAY SOLN [id number] Yes PLACE 2 SPRAYS INTO BOTH NOSTRILS AT BEDTIME AS NEEDED FOR RHINITIS OR ALLERGIES. Seabron Spates R, DO Taking Active   Coenzyme Q10 (CO Q-10 PO) [id number] Yes Take 1 capsule by mouth daily. [provider] Taking Active Self  Dulaglutide (TRULICITY) 3 MG/0.5ML SOPN [id number] Yes Inject 3 mg into the skin once a week. Donato Schultz, DO Taking Active   esomeprazole (NEXIUM) 40 MG capsule [id number] Yes Take 1 capsule (40 mg total) by mouth daily.  Patient taking differently: Take 40 mg by mouth daily before breakfast.   Pecola Lawless, MD Taking Active Self  ezetimibe (ZETIA) 10 MG tablet [id number] Yes TAKE  1 TABLET BY MOUTH EVERY DAY  Patient taking differently: Take 10 mg by mouth daily.   Rollene Rotunda, MD Taking Active Self  FARXIGA 10 MG TABS tablet [id number] Yes Take 10 mg by mouth in the morning. [provider] Taking Active Self  fenofibrate 160 MG tablet [id number] Yes TAKE 1 TABLET BY MOUTH EVERY DAY Zola Button, Grayling Congress, DO Taking Active   folic acid (FOLVITE) 1 MG tablet [id number] Yes TAKE 2 TABLETS BY MOUTH EVERY DAY Josph Macho, MD Taking Active   glimepiride (AMARYL) 2 MG tablet [id number] Yes TAKE 1 TABLET (2 MG TOTAL) BY MOUTH IN THE MORNING AND AT BEDTIME. Seabron Spates R, DO Taking Active   glucose blood (FREESTYLE LITE) test strip [id number] Yes USE TO TEST BLOOD SUGAR ONCE A DAY. DX CODE: E11.9 Donato Schultz, DO Taking Active  icosapent Ethyl (VASCEPA) 1 g capsule [id number] Yes TAKE 2 CAPSULES BY MOUTH 2 TIMES DAILY. Zola Button, Grayling Congress, DO Taking Active   Lancets (FREESTYLE) lancets [id number] Yes USE ONCE A DAY TO CHECK BLOOD SUGAR. Donato Schultz, DO Taking Active Self  Menthol-Camphor (ICY HOT ADVANCED PAIN RELIEF EX) [id number] Yes Apply 1 application  topically daily as needed (to painful sites). [provider] Taking Active Self  metoprolol succinate (TOPROL-XL) 25 MG 24 hr tablet [id number] Yes Take 1 tablet (25 mg total) by mouth daily. Rollene Rotunda, MD Taking Active Self  nitroGLYCERIN (NITROSTAT) 0.4 MG SL tablet [id number] Yes PLACE 1 TABLET UNDER THE TONGUE EVERY 5 MINUTES AS NEEDED FOR CHEST PAIN. Rollene Rotunda, MD Taking Active   potassium chloride SA (KLOR-CON M20) 20 MEQ tablet [id number] Yes Take 2 tablets (40 mEq total) by mouth daily. Rollene Rotunda, MD Taking Active   pregabalin (LYRICA) 150 MG capsule [id number] Yes TAKE 1 CAPSULE BY MOUTH TWICE A DAY Zola Button, Grayling Congress, DO Taking Active   pregabalin (LYRICA) 150 MG capsule [id number] Yes Take 1 capsule (150 mg total) by mouth 2 (two) times daily. Seabron Spates R, DO Taking Active   psyllium (METAMUCIL) 58.6 % powder [id number] Yes Take 1 packet by mouth daily as needed (for constipation- mix and drink). [provider] Taking Active Self  senna (SENOKOT) 8.6 MG TABS tablet [id number] Yes Take 2 tablets (17.2 mg total) by mouth at bedtime. This is for constipation.  Stop taking if you start having diarrhea.  Patient taking differently: Take 2 tablets by mouth daily as needed for mild constipation. This is for constipation.  Stop taking if you start having diarrhea.   Elease Etienne, MD Taking Active Self  torsemide (DEMADEX) 20 MG tablet [id number] Yes TAKE 2 TABLETS (40 MG TOTAL) BY MOUTH IN THE MORNING. DO NOT TAKE ON 01/24/2022. Marcelino Duster, PA Taking Active   Med List Note Mathews Robinsons, CPhT 06/10/13 2355): cpap machine 2 L of oxygen at night            Patient Active Problem List   Diagnosis Date Noted   Simple chronic bronchitis 06/03/2022   Cellulitis of lower extremity 04/30/2022   Type 2 DM with CKD stage 3 and hypertension 04/30/2022   Cellulitis of right leg 04/24/2022   Stage 3b chronic kidney disease (CKD) 04/24/2022   Lactic acidosis 04/24/2022   Left upper quadrant pain 04/08/2022   Subacute cough 04/08/2022   Chronic combined systolic and diastolic heart failure 02/06/2022   Acute exacerbation of CHF (congestive heart failure) 01/16/2022   Hemoptysis 12/26/2021   Subclavian vein stenosis 12/23/2021   Strep throat 11/16/2021   Multiple superficial wounds with infection 01/04/2021   Cardiac pacemaker in situ 11/20/2020   S/P CABG x 4 11/20/2020   Ascending aortic aneurysm 11/20/2020   Senile purpura 07/27/2020   Porokeratosis 06/20/2020   Pain due to onychomycosis of toenails of both feet 12/14/2019   Educated about COVID-19 virus infection 11/25/2019   DM2 (diabetes mellitus, type 2) 08/10/2019   Hyperlipidemia associated with type 2 diabetes mellitus 08/10/2019   S/P left TKA 06/29/2019    Peripheral neuropathy 07/27/2018   Uncontrolled type 2 diabetes mellitus with hyperglycemia 05/13/2018   Diabetic peripheral neuropathy associated with type 2 diabetes mellitus 05/13/2018   Pansinusitis 05/13/2018   Severe aortic stenosis    Complete heart block    Paroxysmal atrial fibrillation    Acute on chronic diastolic CHF (  congestive heart failure), NYHA class 2    Left arm swelling    Status post transcatheter aortic valve replacement (TAVR) using bioprosthesis 09/09/2017   Demand ischemia of myocardium    Typical atrial flutter    Acute on chronic diastolic heart failure    GIB (gastrointestinal bleeding) 08/16/2017   Hyperlipidemia 08/05/2017   NSTEMI (non-ST elevated myocardial infarction)    Aortic stenosis, severe 07/25/2017   Pancytopenia 07/24/2017   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 02/27/2017   Bradycardia 01/04/2017   Coronary artery disease 01/04/2017   Stenosis of carotid artery 01/04/2017   Obesity 09/18/2016   Wenckebach block    Disorder associated with type II diabetes mellitus 05/21/2013   Spinal stenosis of lumbar region 04/08/2013   Anemia, iron deficiency 11/29/2011   B12 deficiency anemia 07/30/2011   OSA (obstructive sleep apnea) 07/06/2010   CAD, ARTERY BYPASS GRAFT 08/22/2009   Essential hypertension 03/29/2009   Bilateral lower extremity edema 02/21/2009   Hyperlipidemia LDL goal <70 11/26/2008   GOUT 08/17/2006     Medication Assistance:   High med cost when in coverage gap. Screened for medication assistance program in past - patient did not qualify. [employer] for hypercholesterolemia. Approved for $2500 thru 06/03/2023   Assessment / Plan: Type 2 DM - not at goal Continue Trulicity 3mg  weekly, glimepiride 2mg  twice a day and Farxiga 10mg  daily.  Continue to check blood glucose once a day  Medication management:  Reviewed and updated medication list Reviewed refill history and adherence Will consult  with PCP about lowering dose of Lyrica to 100mg  twice a day (noon and bedtime) to see if improves daytime drowsiness while still controlling neuropathy.  Follow Up:  Telephone follow up appointment with care management team member scheduled for:  2 to 3 months   Henrene Pastor, PharmD Clinical Pharmacist [facility]  - [facility] [phone]

## 2022-10-23 DIAGNOSIS — I4821 Permanent atrial fibrillation: Secondary | ICD-10-CM | POA: Insufficient documentation

## 2022-10-23 NOTE — Progress Notes (Unsigned)
Cardiology Office Note:   Date:  10/24/2022  ID:  Richard [name], DOB 27-Apr-1941, MRN [id number]  History of Present Illness:   Richard [name] is a 82 y.o. male who presents for follow up of CAD and TAVR.  He has had atrial flutter and heart block as well necessitating a pacemaker.   He has had continued problems with triglycerides.  He has some renal insufficiency.    He had a leadless pacemaker placed.   This was in response to subclavian stenosis associated with a transvenous pacemaker.   He still has chronic thrombosis .  He also was in the hospital  at [facility] in [place] with acute sepsis.  The etiology was not clear.  He did recover from this.  He presented with altered mental status and loss of consciousness.  He has SOB in 2023 and had a perfusion study with no prior ischemia or infarct.  He had an EF of 30 - 35%.    He thinks that since his hospitalization to live last year when he had volume overload he has done well.  He is golfing Monday Wednesday and Friday.  He denies any new shortness of breath, PND orthopnea.  He had no new chest pressure, neck or arm discomfort.  His weights have stayed very stable in the 2 26-28 range.  He had no new edema.    ROS: As stated in the HPI and negative for all other systems.  Studies Reviewed:    EKG:   Atrial fibrillation, ventricular pacing 100% capture    Risk Assessment/Calculations:    CHA2DS2-VASc Score = 6   This indicates a 9.7% annual risk of stroke. The patient's score is based upon: CHF History: 1 HTN History: 1 Diabetes History: 1 Stroke History: 0 Vascular Disease History: 1 Age Score: 2 Gender Score: 0       Physical Exam:   VS:  BP 120/70   Ht 6' (1.829 m)   Wt 233 lb (105.7 kg)   SpO2 94%   BMI 31.60 kg/m    Wt Readings from Last 3 Encounters:  10/24/22 233 lb (105.7 kg)  09/25/22 233 lb 1.3 oz (105.7 kg)  08/28/22 237 lb (107.5 kg)     GEN: Well nourished, well developed in no acute distress NECK: No  JVD; No carotid bruits CARDIAC: Irregular RR, 2 out of 6 brief apical assist nonradiating, no diastolic murmurs, rubs, gallops RESPIRATORY:  Clear to auscultation without rales, wheezing or rhonchi  ABDOMEN: Soft, non-tender, non-distended EXTREMITIES:  No edema; No deformity   ASSESSMENT AND PLAN:   Chronic systolic heart failure:   He has been intolerant of ARB's and ARNI secondary to angioedema.  He is tolerating the meds as listed.  He is maintaining his weights and volume status.  He had a conversation with his electrophysiologist and they discussed given the reduced ejection fraction possibly moving to CRT.  He was waiting to talk to me about this.  I actually think this would be a very good idea since we have minimal medical management and he had a fall on his ejection fraction possibly pacemaker related.  He would be potentially venous access from the right side.  I will get a follow-up echocardiogram.  Hypertension:  This is being managed in the context of treating his CHF.  His blood pressure has been well-controlled.  He is actually been off amlodipine since his last hospitalization.  Permanent atrial fibrillation: He tolerates anticoagulation and is on the appropriate dose.  CAD s/p CABG 2001: He had patent bypass grafts at the time of his TAVR.  No change in therapy.  2:1 AVB post TAVR now s/p leadless pacemaker: As above.  He will go back to his [facility] EP   AS s/p TAVR:   I will evaluate this at the time of his echo.   OSA on CPAP: "I sleep like a baby."         Signed, Rollene Rotunda, MD

## 2022-10-24 ENCOUNTER — Ambulatory Visit: Payer: Medicare Other | Attending: Cardiology | Admitting: Cardiology

## 2022-10-24 ENCOUNTER — Encounter: Payer: Self-pay | Admitting: Cardiology

## 2022-10-24 VITALS — BP 120/70 | Ht 72.0 in | Wt 233.0 lb

## 2022-10-24 DIAGNOSIS — I4821 Permanent atrial fibrillation: Secondary | ICD-10-CM | POA: Diagnosis not present

## 2022-10-24 DIAGNOSIS — I25810 Atherosclerosis of coronary artery bypass graft(s) without angina pectoris: Secondary | ICD-10-CM | POA: Diagnosis not present

## 2022-10-24 DIAGNOSIS — I5022 Chronic systolic (congestive) heart failure: Secondary | ICD-10-CM | POA: Diagnosis not present

## 2022-10-24 DIAGNOSIS — Z952 Presence of prosthetic heart valve: Secondary | ICD-10-CM | POA: Insufficient documentation

## 2022-10-24 DIAGNOSIS — G4733 Obstructive sleep apnea (adult) (pediatric): Secondary | ICD-10-CM | POA: Insufficient documentation

## 2022-10-24 DIAGNOSIS — I442 Atrioventricular block, complete: Secondary | ICD-10-CM | POA: Diagnosis not present

## 2022-10-24 NOTE — Patient Instructions (Signed)
Medication Instructions:  Your physician recommends that you continue on your current medications as directed. Please refer to the Current Medication list given to you today.  *If you need a refill on your cardiac medications before your next appointment, please call your pharmacy*   Lab Work: None   Testing/Procedures: Your physician has requested that you have an echocardiogram. Echocardiography is a painless test that uses sound waves to create images of your heart. It provides your doctor with information about the size and shape of your heart and how well your heart's chambers and valves are working. This procedure takes approximately one hour. There are no restrictions for this procedure. Please do NOT wear cologne, perfume, aftershave, or lotions (deodorant is allowed). Please arrive 15 minutes prior to your appointment time.    Follow-Up: At [facility], you and your health needs are our priority.  As part of our continuing mission to provide you with exceptional heart care, we have created designated Provider Care Teams.  These Care Teams include your primary Cardiologist (physician) and Advanced Practice Providers (APPs -  Physician Assistants and Nurse Practitioners) who all work together to provide you with the care you need, when you need it.  Your next appointment:   6 month(s)  Provider:   Rollene Rotunda, MD

## 2022-10-25 ENCOUNTER — Other Ambulatory Visit (HOSPITAL_BASED_OUTPATIENT_CLINIC_OR_DEPARTMENT_OTHER): Payer: Self-pay

## 2022-10-25 ENCOUNTER — Other Ambulatory Visit: Payer: Self-pay | Admitting: Family Medicine

## 2022-10-25 ENCOUNTER — Inpatient Hospital Stay (HOSPITAL_BASED_OUTPATIENT_CLINIC_OR_DEPARTMENT_OTHER): Payer: Medicare Other | Admitting: Family

## 2022-10-25 ENCOUNTER — Inpatient Hospital Stay: Payer: Medicare Other | Attending: Hematology & Oncology

## 2022-10-25 ENCOUNTER — Encounter: Payer: Self-pay | Admitting: Family

## 2022-10-25 ENCOUNTER — Inpatient Hospital Stay: Payer: Medicare Other

## 2022-10-25 ENCOUNTER — Other Ambulatory Visit: Payer: Self-pay

## 2022-10-25 VITALS — BP 130/63 | HR 65 | Temp 98.1°F | Resp 19

## 2022-10-25 DIAGNOSIS — D509 Iron deficiency anemia, unspecified: Secondary | ICD-10-CM

## 2022-10-25 DIAGNOSIS — G629 Polyneuropathy, unspecified: Secondary | ICD-10-CM | POA: Diagnosis not present

## 2022-10-25 DIAGNOSIS — D510 Vitamin B12 deficiency anemia due to intrinsic factor deficiency: Secondary | ICD-10-CM | POA: Insufficient documentation

## 2022-10-25 DIAGNOSIS — C8332 Diffuse large B-cell lymphoma, intrathoracic lymph nodes: Secondary | ICD-10-CM | POA: Diagnosis not present

## 2022-10-25 DIAGNOSIS — D508 Other iron deficiency anemias: Secondary | ICD-10-CM

## 2022-10-25 DIAGNOSIS — D519 Vitamin B12 deficiency anemia, unspecified: Secondary | ICD-10-CM

## 2022-10-25 LAB — CBC WITH DIFFERENTIAL (CANCER CENTER ONLY)
Abs Immature Granulocytes: 0.06 10*3/uL (ref 0.00–0.07)
Basophils Absolute: 0 10*3/uL (ref 0.0–0.1)
Basophils Relative: 1 %
Eosinophils Absolute: 0.1 10*3/uL (ref 0.0–0.5)
Eosinophils Relative: 1 %
HCT: 44.5 % (ref 39.0–52.0)
Hemoglobin: 14.3 g/dL (ref 13.0–17.0)
Immature Granulocytes: 1 %
Lymphocytes Relative: 21 %
Lymphs Abs: 1.6 10*3/uL (ref 0.7–4.0)
MCH: 29.1 pg (ref 26.0–34.0)
MCHC: 32.1 g/dL (ref 30.0–36.0)
MCV: 90.4 fL (ref 80.0–100.0)
Monocytes Absolute: 0.5 10*3/uL (ref 0.1–1.0)
Monocytes Relative: 7 %
Neutro Abs: 5.4 10*3/uL (ref 1.7–7.7)
Neutrophils Relative %: 69 %
Platelet Count: 179 10*3/uL (ref 150–400)
RBC: 4.92 MIL/uL (ref 4.22–5.81)
RDW: 17.6 % — ABNORMAL HIGH (ref 11.5–15.5)
WBC Count: 7.7 10*3/uL (ref 4.0–10.5)
nRBC: 0 % (ref 0.0–0.2)

## 2022-10-25 LAB — CMP (CANCER CENTER ONLY)
ALT: 19 U/L (ref 0–44)
AST: 19 U/L (ref 15–41)
Albumin: 4.4 g/dL (ref 3.5–5.0)
Alkaline Phosphatase: 61 U/L (ref 38–126)
Anion gap: 7 (ref 5–15)
BUN: 41 mg/dL — ABNORMAL HIGH (ref 8–23)
CO2: 31 mmol/L (ref 22–32)
Calcium: 9.7 mg/dL (ref 8.9–10.3)
Chloride: 102 mmol/L (ref 98–111)
Creatinine: 1.79 mg/dL — ABNORMAL HIGH (ref 0.61–1.24)
GFR, Estimated: 37 mL/min — ABNORMAL LOW (ref >60)
Glucose, Bld: 181 mg/dL — ABNORMAL HIGH (ref 70–99)
Potassium: 4.3 mmol/L (ref 3.5–5.1)
Sodium: 140 mmol/L (ref 135–145)
Total Bilirubin: 0.6 mg/dL (ref 0.3–1.2)
Total Protein: 6.8 g/dL (ref 6.5–8.1)

## 2022-10-25 LAB — FERRITIN: Ferritin: 1315 ng/mL — ABNORMAL HIGH (ref 24–336)

## 2022-10-25 LAB — RETICULOCYTES
Immature Retic Fract: 27.7 % — ABNORMAL HIGH (ref 2.3–15.9)
RBC.: 4.86 MIL/uL (ref 4.22–5.81)
Retic Count, Absolute: 94.8 10*3/uL (ref 19.0–186.0)
Retic Ct Pct: 2 % (ref 0.4–3.1)

## 2022-10-25 LAB — IRON AND IRON BINDING CAPACITY (CC-WL,HP ONLY)
Iron: 103 ug/dL (ref 45–182)
Saturation Ratios: 26 % (ref 17.9–39.5)
TIBC: 402 ug/dL (ref 250–450)
UIBC: 299 ug/dL (ref 117–376)

## 2022-10-25 LAB — LACTATE DEHYDROGENASE: LDH: 152 U/L (ref 98–192)

## 2022-10-25 MED ORDER — TRULICITY 3 MG/0.5ML ~~LOC~~ SOAJ
3.0000 mg | SUBCUTANEOUS | 1 refills | Status: DC
  Filled 2022-10-25: qty 2, 28d supply, fill #0
  Filled 2022-11-20: qty 2, 28d supply, fill #1

## 2022-10-25 MED ORDER — DENOSUMAB 120 MG/1.7ML ~~LOC~~ SOLN
120.0000 mg | Freq: Once | SUBCUTANEOUS | Status: AC
  Administered 2022-10-25: 120 mg via SUBCUTANEOUS
  Filled 2022-10-25: qty 1.7

## 2022-10-25 MED ORDER — CYANOCOBALAMIN 1000 MCG/ML IJ SOLN
1000.0000 ug | Freq: Once | INTRAMUSCULAR | Status: AC
  Administered 2022-10-25: 1000 ug via INTRAMUSCULAR
  Filled 2022-10-25: qty 1

## 2022-10-25 NOTE — Progress Notes (Signed)
Hematology and Oncology Follow Up Visit  Richard [name] [id number] [birth year] 82 y.o. 10/25/2022   Principle Diagnosis:  Diffuse large cell non-Hodgkin's lymphoma (IPI = 3) - NOT "double hit" Pernicious anemia Iron deficiency secondary to bleeding   Past Therapy: R-CHOP - s/p cycle 8 - completed 08/2017   Current Therapy:        Vitamin B12 1 mg IM every month Xgeva 120 mg subcu q 3 months - next dose due in 12/2022 IV iron as indicated   Interim History:  Richard [name] is here today for follow-up and B 12 injection/Xgeva. He is doing quite well and has no complaints at this time.  He states that cardiology is doing a ECHO and plans to replace his pacemake and put in the right chest this time.  He is staying active golfing regularly.  No fever, chills, n/v, cough, rash, dizziness, SOB, chest pain, palpitations, abdominal pain or changes in bowel or bladder habits.  No blood loss. No bruising or petechiae.  Minimal swelling in the ankles.  Neuropathy in the hands and feet improves with Lyrica.  No falls or syncope.  Appetite and hydration are good. Weight is stable per patient.   ECOG Performance Status: 1 - Symptomatic but completely ambulatory  Medications:  Allergies as of 10/25/2022       Reactions   Ace Inhibitors Swelling, Other (See Comments)   Angioedema   Benazepril Swelling, Other (See Comments)   Angioedema; he is not a candidate for any angiotensin receptor blockers because of this significant allergic reaction. Because of a history of documented adverse serious drug reaction;Medi Alert bracelet  is recommended   Entresto [sacubitril-valsartan] Swelling, Other (See Comments)   First-in-Class Angiotensin Receptor Neprilysin Inhibitor- Med was "red-flagged" by the patient's pharmacy for him to NOT take!   Hctz [hydrochlorothiazide] Anaphylaxis, Swelling   Tongue and lip swelling   Aspirin Other (See Comments)   Gastritis, can aspirin not take 325 mg aspirin         Medication List        Accurate as of October 25, 2022 12:46 PM. If you have any questions, ask your nurse or doctor.          acetaminophen 325 MG tablet Commonly known as: TYLENOL Take 650 mg by mouth at bedtime as needed for moderate pain or headache.   allopurinol 300 MG tablet Commonly known as: ZYLOPRIM Take 450 mg by mouth daily.   aluminum hydroxide-magnesium carbonate 95-358 MG/15ML Susp Commonly known as: GAVISCON Take 15 mLs by mouth as needed for indigestion or heartburn.   apixaban 2.5 MG Tabs tablet Commonly known as: ELIQUIS Take 1 tablet (2.5 mg total) by mouth 2 (two) times daily.   atorvastatin 80 MG tablet Commonly known as: LIPITOR TAKE 1 TABLET BY MOUTH EVERYDAY AT BEDTIME What changed: See the new instructions.   Azelastine HCl 137 MCG/SPRAY Soln PLACE 2 SPRAYS INTO BOTH NOSTRILS AT BEDTIME AS NEEDED FOR RHINITIS OR ALLERGIES.   CO Q-10 PO Take 1 capsule by mouth daily.   esomeprazole 40 MG capsule Commonly known as: NexIUM Take 1 capsule (40 mg total) by mouth daily. What changed: when to take this   ezetimibe 10 MG tablet Commonly known as: ZETIA Take 1 tablet (10 mg total) by mouth daily.   Farxiga 10 MG Tabs tablet Generic drug: dapagliflozin propanediol Take 10 mg by mouth in the morning.   fenofibrate 160 MG tablet TAKE 1 TABLET BY MOUTH EVERY DAY   folic  acid 1 MG tablet Commonly known as: FOLVITE TAKE 2 TABLETS BY MOUTH EVERY DAY   freestyle lancets USE ONCE A DAY TO CHECK BLOOD SUGAR.   FREESTYLE LITE test strip Generic drug: glucose blood USE TO TEST BLOOD SUGAR ONCE A DAY. DX CODE: E11.9   glimepiride 2 MG tablet Commonly known as: AMARYL TAKE 1 TABLET (2 MG TOTAL) BY MOUTH IN THE MORNING AND AT BEDTIME.   ICY HOT ADVANCED PAIN RELIEF EX Apply 1 application  topically daily as needed (to painful sites).   metoprolol succinate 25 MG 24 hr tablet Commonly known as: TOPROL-XL Take 1 tablet (25 mg total) by  mouth daily.   nitroGLYCERIN 0.4 MG SL tablet Commonly known as: NITROSTAT PLACE 1 TABLET UNDER THE TONGUE EVERY 5 MINUTES AS NEEDED FOR CHEST PAIN.   potassium chloride SA 20 MEQ tablet Commonly known as: Klor-Con M20 Take 2 tablets (40 mEq total) by mouth daily.   pregabalin 100 MG capsule Commonly known as: Lyrica Take 1 capsule (100 mg total) by mouth 2 (two) times daily.   psyllium 58.6 % powder Commonly known as: METAMUCIL Take 1 packet by mouth daily as needed (for constipation- mix and drink).   senna 8.6 MG Tabs tablet Commonly known as: SENOKOT Take 2 tablets (17.2 mg total) by mouth at bedtime. This is for constipation.  Stop taking if you start having diarrhea. What changed:  when to take this reasons to take this   torsemide 20 MG tablet Commonly known as: DEMADEX TAKE 2 TABLETS (40 MG TOTAL) BY MOUTH IN THE MORNING. DO NOT TAKE ON 01/24/2022.   Trulicity 3 MG/0.5ML Sopn Generic drug: Dulaglutide Inject 3 mg into the skin once a week.   Vascepa 1 g capsule Generic drug: icosapent Ethyl TAKE 2 CAPSULES BY MOUTH 2 TIMES DAILY.   VITAMIN C PO Take 1 tablet by mouth daily with breakfast.        Allergies:  Allergies  Allergen Reactions   Ace Inhibitors Swelling and Other (See Comments)    Angioedema    Benazepril Swelling and Other (See Comments)    Angioedema; he is not a candidate for any angiotensin receptor blockers because of this significant allergic reaction. Because of a history of documented adverse serious drug reaction;Medi Alert bracelet  is recommended   Entresto [Sacubitril-Valsartan] Swelling and Other (See Comments)    First-in-Class Angiotensin Receptor Neprilysin Inhibitor- Med was "red-flagged" by the patient's pharmacy for him to NOT take!   Hctz [Hydrochlorothiazide] Anaphylaxis and Swelling    Tongue and lip swelling    Aspirin Other (See Comments)    Gastritis, can aspirin not take 325 mg aspirin    Past Medical History,  Surgical history, Social history, and Family History were reviewed and updated.  Review of Systems: All other 10 point review of systems is negative.   Physical Exam:  temperature is 98.1 F (36.7 C). His blood pressure is 130/63 and his pulse is 65. His respiration is 19 and oxygen saturation is 94%.   Wt Readings from Last 3 Encounters:  10/24/22 233 lb (105.7 kg)  09/25/22 233 lb 1.3 oz (105.7 kg)  08/28/22 237 lb (107.5 kg)    Ocular: Sclerae unicteric, pupils equal, round and reactive to light Ear-nose-throat: Oropharynx clear, dentition fair Lymphatic: No cervical or supraclavicular adenopathy Lungs no rales or rhonchi, good excursion bilaterally Heart regular rate and rhythm, no murmur appreciated Abd soft, nontender, positive bowel sounds MSK no focal spinal tenderness, no joint edema Neuro:  non-focal, well-oriented, appropriate affect Breasts: Deferred   Lab Results  Component Value Date   WBC 7.7 10/25/2022   HGB 14.3 10/25/2022   HCT 44.5 10/25/2022   MCV 90.4 10/25/2022   PLT 179 10/25/2022   Lab Results  Component Value Date   FERRITIN 1,483 (H) 09/25/2022   IRON 78 09/25/2022   TIBC 363 09/25/2022   UIBC 285 09/25/2022   IRONPCTSAT 22 09/25/2022   Lab Results  Component Value Date   RETICCTPCT 2.0 10/25/2022   RBC 4.86 10/25/2022   RETICCTABS 52.1 11/22/2011   No results found for: "KPAFRELGTCHN", "LAMBDASER", "[facility]" Lab Results  Component Value Date   IGA 159 03/09/2012   Lab Results  Component Value Date   ALBUMINELP 4.2 07/27/2018   MSPIKE Not Observed 07/27/2018     Chemistry      Component Value Date/Time   NA 143 09/25/2022 1333   NA 138 02/08/2022 0811   NA 144 06/27/2017 0857   K 4.2 09/25/2022 1333   K 3.9 06/27/2017 0857   CL 104 09/25/2022 1333   CL 103 06/27/2017 0857   CO2 27 09/25/2022 1333   CO2 26 06/27/2017 0857   BUN 50 (H) 09/25/2022 1333   BUN 46 (H) 02/08/2022 0811   BUN 12 06/27/2017 0857   CREATININE  2.33 (H) 09/25/2022 1333   CREATININE 1.65 (H) 04/18/2020 0956      Component Value Date/Time   CALCIUM 9.5 09/25/2022 1333   CALCIUM 9.2 06/27/2017 0857   ALKPHOS 61 09/25/2022 1333   ALKPHOS 128 (H) 06/27/2017 0857   AST 22 09/25/2022 1333   ALT 31 09/25/2022 1333   ALT 24 06/27/2017 0857   BILITOT 0.7 09/25/2022 1333       Impression and Plan:  Richard [name] is a very pleasant 82 yo caucasian gentleman with diffuse large B-cell lymphoma (not "double hit" lymphoma). He completed treatment in February 2019.  Iron studies pending.  B 12 and Xgeva given today as planned.  Follow-up in 1 month.    Eileen Stanford, NP 4/26/202412:46 PM

## 2022-10-25 NOTE — Patient Instructions (Addendum)
Denosumab Injection (Oncology) What is this medication? DENOSUMAB (den oh SUE mab) prevents weakened bones caused by cancer. It may also be used to treat noncancerous bone tumors that cannot be removed by surgery. It can also be used to treat high calcium levels in the blood caused by cancer. It works by blocking a protein that causes bones to break down quickly. This slows down the release of calcium from bones, which lowers calcium levels in your blood. It also makes your bones stronger and less likely to break (fracture). This medicine may be used for other purposes; ask your health care provider or pharmacist if you have questions. COMMON BRAND NAME(S): XGEVA What should I tell my care team before I take this medication? They need to know if you have any of these conditions: Dental disease Having surgery or tooth extraction Infection Kidney disease Low levels of calcium or vitamin D in the blood Malnutrition On hemodialysis Skin conditions or sensitivity Thyroid or parathyroid disease An unusual reaction to denosumab, other medications, foods, dyes, or preservatives Pregnant or trying to get pregnant Breast-feeding How should I use this medication? This medication is for injection under the skin. It is given by your care team in a hospital or clinic setting. A special MedGuide will be given to you before each treatment. Be sure to read this information carefully each time. Talk to your care team about the use of this medication in children. While it may be prescribed for children as young as 13 years for selected conditions, precautions do apply. Overdosage: If you think you have taken too much of this medicine contact a poison control center or emergency room at once. NOTE: This medicine is only for you. Do not share this medicine with others. What if I miss a dose? Keep appointments for follow-up doses. It is important not to miss your dose. Call your care team if you are unable to  keep an appointment. What may interact with this medication? Do not take this medication with any of the following: Other medications containing denosumab This medication may also interact with the following: Medications that lower your chance of fighting infection Steroid medications, such as prednisone or cortisone This list may not describe all possible interactions. Give your health care provider a list of all the medicines, herbs, non-prescription drugs, or dietary supplements you use. Also tell them if you smoke, drink alcohol, or use illegal drugs. Some items may interact with your medicine. What should I watch for while using this medication? Your condition will be monitored carefully while you are receiving this medication. You may need blood work while taking this medication. This medication may increase your risk of getting an infection. Call your care team for advice if you get a fever, chills, sore throat, or other symptoms of a cold or flu. Do not treat yourself. Try to avoid being around people who are sick. You should make sure you get enough calcium and vitamin D while you are taking this medication, unless your care team tells you not to. Discuss the foods you eat and the vitamins you take with your care team. Some people who take this medication have severe bone, joint, or muscle pain. This medication may also increase your risk for jaw problems or a broken thigh bone. Tell your care team right away if you have severe pain in your jaw, bones, joints, or muscles. Tell your care team if you have any pain that does not go away or that gets worse. Talk   to your care team if you may be pregnant. Serious birth defects can occur if you take this medication during pregnancy and for 5 months after the last dose. You will need a negative pregnancy test before starting this medication. Contraception is recommended while taking this medication and for 5 months after the last dose. Your care team  can help you find the option that works for you. What side effects may I notice from receiving this medication? Side effects that you should report to your care team as soon as possible: Allergic reactions--skin rash, itching, hives, swelling of the face, lips, tongue, or throat Bone, joint, or muscle pain Low calcium level--muscle pain or cramps, confusion, tingling, or numbness in the hands or feet Osteonecrosis of the jaw--pain, swelling, or redness in the mouth, numbness of the jaw, poor healing after dental work, unusual discharge from the mouth, visible bones in the mouth Side effects that usually do not require medical attention (report to your care team if they continue or are bothersome): Cough Diarrhea Fatigue Headache Nausea This list may not describe all possible side effects. Call your doctor for medical advice about side effects. You may report side effects to FDA at 1-800-FDA-1088. Where should I keep my medication? This medication is given in a hospital or clinic. It will not be stored at home. NOTE: This sheet is a summary. It may not cover all possible information. If you have questions about this medicine, talk to your doctor, pharmacist, or health care provider.  2023 Elsevier/Gold Standard (2021-11-05 00:00:00)  Vitamin B12 Deficiency Vitamin B12 deficiency means that your body does not have enough vitamin B12. The body needs this important vitamin: To make red blood cells. To make genes (DNA). To help the nerves work. If you do not have enough vitamin B12 in your body, you can have health problems, such as not having enough red blood cells in the blood (anemia). What are the causes? Not eating enough foods that contain vitamin B12. Not being able to take in (absorb) vitamin B12 from the food that you eat. Certain diseases. A condition in which the body does not make enough of a certain protein. This results in your body not taking in enough vitamin B12. Having a  surgery in which part of the stomach or small intestine is taken out. Taking medicines that make it hard for the body to take in vitamin B12. These include: Heartburn medicines. Some medicines that are used to treat diabetes. What increases the risk? Being an older adult. Eating a vegetarian or vegan diet that does not include any foods that come from animals. Not eating enough foods that contain vitamin B12 while you are pregnant. Taking certain medicines. Having alcoholism. What are the signs or symptoms? In some cases, there are no symptoms. If the condition leads to too few blood cells or nerve damage, symptoms can occur, such as: Feeling weak or tired. Not being hungry. Losing feeling (numbness) or tingling in your hands and feet. Redness and burning of the tongue. Feeling sad (depressed). Confusion or memory problems. Trouble walking. If anemia is very bad, symptoms can include: Being short of breath. Being dizzy. Having a very fast heartbeat. How is this treated? Changing the way you eat and drink, such as: Eating more foods that contain vitamin B12. Drinking little or no alcohol. Getting vitamin B12 shots. Taking vitamin B12 supplements by mouth (orally). Your doctor will tell you the dose that is best for you. Follow these instructions at home:  Eating and drinking  Eat foods that come from animals and have a lot of vitamin B12 in them. These include: Meats and poultry. This includes beef, pork, chicken, [place], and organ meats, such as liver. Seafood, such as clams, rainbow trout, salmon, tuna, and haddock. Eggs. Dairy foods such as milk, yogurt, and cheese. Eat breakfast cereals that have vitamin B12 added to them (are fortified). Check the label. The items listed above may not be a complete list of foods and beverages you can eat and drink. Contact a dietitian for more information. Alcohol use Do not drink alcohol if: Your doctor tells you not to drink. You are  pregnant, may be pregnant, or are planning to become pregnant. If you drink alcohol: Limit how much you have to: 0-1 drink a day for women. 0-2 drinks a day for men. Know how much alcohol is in your drink. In the [place] one drink equals one 12 oz bottle of beer (355 mL), one 5 oz glass of wine (148 mL), or one 1 oz glass of hard liquor (44 mL). General instructions Get any vitamin B12 shots if told by your doctor. Take supplements only as told by your doctor. Follow the directions. Keep all follow-up visits. Contact a doctor if: Your symptoms come back. Your symptoms get worse or do not get better with treatment. Get help right away if: You have trouble breathing. You have a very fast heartbeat. You have chest pain. You get dizzy. You faint. These symptoms may be an emergency. Get help right away. Call 911. Do not wait to see if the symptoms will go away. Do not drive yourself to the hospital. Summary Vitamin B12 deficiency means that your body is not getting enough of the vitamin. In some cases, there are no symptoms of this condition. Treatment may include making a change in the way you eat and drink, getting shots, or taking supplements. Eat foods that have vitamin B12 in them. This information is not intended to replace advice given to you by your health care provider. Make sure you discuss any questions you have with your health care provider. Document Revised: 02/09/2021 Document Reviewed: 02/09/2021 Elsevier Patient Education  2023 [employer].

## 2022-10-28 ENCOUNTER — Ambulatory Visit: Payer: Medicare Other

## 2022-10-28 ENCOUNTER — Telehealth: Payer: Self-pay

## 2022-10-28 NOTE — Telephone Encounter (Signed)
Patient called to cancel his appt today for red light.   LM on VM returning his call please call be to reschedule his red light appt

## 2022-10-31 ENCOUNTER — Encounter: Payer: Self-pay | Admitting: Family

## 2022-11-04 ENCOUNTER — Ambulatory Visit (HOSPITAL_COMMUNITY): Payer: Medicare Other | Attending: Cardiology

## 2022-11-04 DIAGNOSIS — Z952 Presence of prosthetic heart valve: Secondary | ICD-10-CM | POA: Insufficient documentation

## 2022-11-04 LAB — ECHOCARDIOGRAM COMPLETE
AV Area VTI: 1.76 cm2
AV Area mean vel: 1.68 cm2
AV Peak grad: 15 mmHg

## 2022-11-05 ENCOUNTER — Ambulatory Visit: Payer: Medicare Other

## 2022-11-05 ENCOUNTER — Ambulatory Visit (INDEPENDENT_AMBULATORY_CARE_PROVIDER_SITE_OTHER): Payer: Medicare Other | Admitting: Dermatology

## 2022-11-05 DIAGNOSIS — L570 Actinic keratosis: Secondary | ICD-10-CM

## 2022-11-05 MED ORDER — AMINOLEVULINIC ACID HCL 10 % EX GEL
2000.0000 mg | Freq: Once | CUTANEOUS | Status: AC
Start: 2022-11-05 — End: 2022-11-05
  Administered 2022-11-05: 2000 mg via TOPICAL

## 2022-11-05 NOTE — Progress Notes (Signed)
Patient completed red light phototherapy with debridement today.  ACTINIC KERATOSES Exam: Erythematous thin papules/macules with gritty scale.  Treatment Plan:  Red Light Photodynamic therapy  Procedure discussed: discussed risks, benefits, side effects. and alternatives   Prep: site scrubbed/prepped with acetone   Debridement needed: Yes (performed by Physician with sand paper.  (CPT [id number])) Location:  face Number of lesions:  Multiple (> 15) Type of treatment:  Red light Aminolevulinic Acid (see MAR for details): Ameluz Aminolevulinic Acid comment:  [id number] Amount of Ameluz (mg):  1 Incubation time (minutes):  60 Number of minutes under lamp:  20 Cooling:  Fan Outcome: patient tolerated procedure well with no complications   Post-procedure details: sunscreen applied and aftercare instructions given to patient    Related Medications Aminolevulinic Acid HCl 10 % GEL 2,000 mg  Richard [name] CMA  I personally debrided area prior to application of aminolevulinic acid. Richard Niece MD  Documentation: I have reviewed the above documentation for accuracy and completeness, and I agree with the above.  Richard Niece, MD

## 2022-11-05 NOTE — Patient Instructions (Signed)
Ameluz/Red Light Treatment Common Side Effects  - Burning/stinging, which may be severe and last up to 24-72 hours after your treatment  - Redness, swelling and/or peeling which may last up to 4 weeks  - Scaling/crusting which may last up to 2 weeks  - Sun sensitivity (you MUST avoid sun exposure for 48-72 hours after treatment)  Care Instructions  - Okay to wash with soap and water and shampoo as normal  - If needed, you can do a cold compress (ex. Ice packs) for comfort  - If okay with your Primary Doctor, you may use analgesics such as Tylenol every 4-6 hours, not to exceed recommended dose  - You may apply Cerave Healing Ointment, Vaseline or Aquaphor  - If you have a lot of swelling you may take a Benadryl to help with this (this may cause drowsiness)  Sun Precautions  - Wear a wide brim hat for the next week if outside  - Wear a sunblock with zinc or titanium dioxide at least SPF 50 daily   We will recheck you in 10-12 weeks. If any problems, please call the office and ask to speak with a nurse.  

## 2022-11-09 LAB — ECHOCARDIOGRAM COMPLETE
AR max vel: 1.69 cm2
AV Mean grad: 7.8 mmHg
Ao pk vel: 1.94 m/s
Area-P 1/2: 3 cm2
MV M vel: 5.55 m/s
MV Peak grad: 123.2 mmHg
MV VTI: 1.37 cm2
S' Lateral: 5 cm

## 2022-11-20 ENCOUNTER — Other Ambulatory Visit (HOSPITAL_BASED_OUTPATIENT_CLINIC_OR_DEPARTMENT_OTHER): Payer: Self-pay

## 2022-11-21 ENCOUNTER — Other Ambulatory Visit: Payer: Self-pay | Admitting: Family Medicine

## 2022-11-21 ENCOUNTER — Other Ambulatory Visit (HOSPITAL_BASED_OUTPATIENT_CLINIC_OR_DEPARTMENT_OTHER): Payer: Self-pay

## 2022-11-21 DIAGNOSIS — E1151 Type 2 diabetes mellitus with diabetic peripheral angiopathy without gangrene: Secondary | ICD-10-CM

## 2022-11-23 ENCOUNTER — Other Ambulatory Visit: Payer: Self-pay | Admitting: Cardiology

## 2022-11-23 DIAGNOSIS — I483 Typical atrial flutter: Secondary | ICD-10-CM

## 2022-11-26 ENCOUNTER — Inpatient Hospital Stay (HOSPITAL_BASED_OUTPATIENT_CLINIC_OR_DEPARTMENT_OTHER): Payer: Medicare Other | Admitting: Family

## 2022-11-26 ENCOUNTER — Inpatient Hospital Stay: Payer: Medicare Other

## 2022-11-26 ENCOUNTER — Encounter: Payer: Self-pay | Admitting: Family

## 2022-11-26 ENCOUNTER — Ambulatory Visit (INDEPENDENT_AMBULATORY_CARE_PROVIDER_SITE_OTHER): Payer: Medicare Other | Admitting: Family Medicine

## 2022-11-26 ENCOUNTER — Inpatient Hospital Stay: Payer: Medicare Other | Attending: Hematology & Oncology

## 2022-11-26 ENCOUNTER — Encounter: Payer: Self-pay | Admitting: Family Medicine

## 2022-11-26 VITALS — BP 100/80 | HR 75 | Temp 97.8°F | Resp 20 | Ht 72.0 in | Wt 235.6 lb

## 2022-11-26 VITALS — BP 131/60 | HR 50 | Temp 97.9°F | Resp 17 | Wt 235.1 lb

## 2022-11-26 DIAGNOSIS — G629 Polyneuropathy, unspecified: Secondary | ICD-10-CM | POA: Insufficient documentation

## 2022-11-26 DIAGNOSIS — Z7985 Long-term (current) use of injectable non-insulin antidiabetic drugs: Secondary | ICD-10-CM | POA: Diagnosis not present

## 2022-11-26 DIAGNOSIS — E1122 Type 2 diabetes mellitus with diabetic chronic kidney disease: Secondary | ICD-10-CM | POA: Diagnosis not present

## 2022-11-26 DIAGNOSIS — D500 Iron deficiency anemia secondary to blood loss (chronic): Secondary | ICD-10-CM | POA: Diagnosis not present

## 2022-11-26 DIAGNOSIS — E1169 Type 2 diabetes mellitus with other specified complication: Secondary | ICD-10-CM

## 2022-11-26 DIAGNOSIS — C8332 Diffuse large B-cell lymphoma, intrathoracic lymph nodes: Secondary | ICD-10-CM | POA: Insufficient documentation

## 2022-11-26 DIAGNOSIS — E119 Type 2 diabetes mellitus without complications: Secondary | ICD-10-CM | POA: Diagnosis not present

## 2022-11-26 DIAGNOSIS — I5042 Chronic combined systolic (congestive) and diastolic (congestive) heart failure: Secondary | ICD-10-CM

## 2022-11-26 DIAGNOSIS — D519 Vitamin B12 deficiency anemia, unspecified: Secondary | ICD-10-CM

## 2022-11-26 DIAGNOSIS — E785 Hyperlipidemia, unspecified: Secondary | ICD-10-CM

## 2022-11-26 DIAGNOSIS — I5032 Chronic diastolic (congestive) heart failure: Secondary | ICD-10-CM

## 2022-11-26 DIAGNOSIS — I10 Essential (primary) hypertension: Secondary | ICD-10-CM | POA: Diagnosis not present

## 2022-11-26 DIAGNOSIS — N183 Chronic kidney disease, stage 3 unspecified: Secondary | ICD-10-CM

## 2022-11-26 DIAGNOSIS — I129 Hypertensive chronic kidney disease with stage 1 through stage 4 chronic kidney disease, or unspecified chronic kidney disease: Secondary | ICD-10-CM | POA: Diagnosis not present

## 2022-11-26 DIAGNOSIS — D510 Vitamin B12 deficiency anemia due to intrinsic factor deficiency: Secondary | ICD-10-CM | POA: Insufficient documentation

## 2022-11-26 DIAGNOSIS — I350 Nonrheumatic aortic (valve) stenosis: Secondary | ICD-10-CM

## 2022-11-26 DIAGNOSIS — D509 Iron deficiency anemia, unspecified: Secondary | ICD-10-CM | POA: Diagnosis not present

## 2022-11-26 DIAGNOSIS — I25118 Atherosclerotic heart disease of native coronary artery with other forms of angina pectoris: Secondary | ICD-10-CM | POA: Diagnosis not present

## 2022-11-26 LAB — CBC WITH DIFFERENTIAL (CANCER CENTER ONLY)
Abs Immature Granulocytes: 0.07 10*3/uL (ref 0.00–0.07)
Basophils Absolute: 0.1 10*3/uL (ref 0.0–0.1)
Basophils Relative: 1 %
Eosinophils Absolute: 0.1 10*3/uL (ref 0.0–0.5)
Eosinophils Relative: 1 %
HCT: 43.8 % (ref 39.0–52.0)
Hemoglobin: 14.3 g/dL (ref 13.0–17.0)
Immature Granulocytes: 1 %
Lymphocytes Relative: 20 %
Lymphs Abs: 1.6 10*3/uL (ref 0.7–4.0)
MCH: 29.3 pg (ref 26.0–34.0)
MCHC: 32.6 g/dL (ref 30.0–36.0)
MCV: 89.8 fL (ref 80.0–100.0)
Monocytes Absolute: 0.6 10*3/uL (ref 0.1–1.0)
Monocytes Relative: 7 %
Neutro Abs: 5.8 10*3/uL (ref 1.7–7.7)
Neutrophils Relative %: 70 %
Platelet Count: 190 10*3/uL (ref 150–400)
RBC: 4.88 MIL/uL (ref 4.22–5.81)
RDW: 17.1 % — ABNORMAL HIGH (ref 11.5–15.5)
WBC Count: 8.3 10*3/uL (ref 4.0–10.5)
nRBC: 0 % (ref 0.0–0.2)

## 2022-11-26 LAB — COMPREHENSIVE METABOLIC PANEL
ALT: 18 U/L (ref 0–53)
AST: 20 U/L (ref 0–37)
Albumin: 4.5 g/dL (ref 3.5–5.2)
Alkaline Phosphatase: 61 U/L (ref 39–117)
BUN: 39 mg/dL — ABNORMAL HIGH (ref 6–23)
CO2: 29 mEq/L (ref 19–32)
Calcium: 9.7 mg/dL (ref 8.4–10.5)
Chloride: 99 mEq/L (ref 96–112)
Creatinine, Ser: 1.8 mg/dL — ABNORMAL HIGH (ref 0.40–1.50)
GFR: 34.18 mL/min — ABNORMAL LOW (ref >60.00)
Glucose, Bld: 125 mg/dL — ABNORMAL HIGH (ref 70–99)
Potassium: 4.3 mEq/L (ref 3.5–5.1)
Sodium: 140 mEq/L (ref 135–145)
Total Bilirubin: 0.8 mg/dL (ref 0.2–1.2)
Total Protein: 6.7 g/dL (ref 6.0–8.3)

## 2022-11-26 LAB — CMP (CANCER CENTER ONLY)
ALT: 15 U/L (ref 0–44)
AST: 18 U/L (ref 15–41)
Albumin: 4.5 g/dL (ref 3.5–5.0)
Alkaline Phosphatase: 58 U/L (ref 38–126)
Anion gap: 15 (ref 5–15)
BUN: 41 mg/dL — ABNORMAL HIGH (ref 8–23)
CO2: 26 mmol/L (ref 22–32)
Calcium: 10.1 mg/dL (ref 8.9–10.3)
Chloride: 99 mmol/L (ref 98–111)
Creatinine: 1.99 mg/dL — ABNORMAL HIGH (ref 0.61–1.24)
GFR, Estimated: 32 mL/min — ABNORMAL LOW (ref >60)
Glucose, Bld: 243 mg/dL — ABNORMAL HIGH (ref 70–99)
Potassium: 4 mmol/L (ref 3.5–5.1)
Sodium: 140 mmol/L (ref 135–145)
Total Bilirubin: 0.8 mg/dL (ref 0.3–1.2)
Total Protein: 6.6 g/dL (ref 6.5–8.1)

## 2022-11-26 LAB — CBC WITH DIFFERENTIAL/PLATELET
Basophils Absolute: 0 10*3/uL (ref 0.0–0.1)
Basophils Relative: 0.6 % (ref 0.0–3.0)
Eosinophils Absolute: 0.1 10*3/uL (ref 0.0–0.7)
Eosinophils Relative: 1.5 % (ref 0.0–5.0)
HCT: 44.6 % (ref 39.0–52.0)
Hemoglobin: 14.6 g/dL (ref 13.0–17.0)
Lymphocytes Relative: 23.8 % (ref 12.0–46.0)
Lymphs Abs: 2 10*3/uL (ref 0.7–4.0)
MCHC: 32.6 g/dL (ref 30.0–36.0)
MCV: 90 fl (ref 78.0–100.0)
Monocytes Absolute: 0.6 10*3/uL (ref 0.1–1.0)
Monocytes Relative: 7.6 % (ref 3.0–12.0)
Neutro Abs: 5.5 10*3/uL (ref 1.4–7.7)
Neutrophils Relative %: 66.5 % (ref 43.0–77.0)
Platelets: 208 10*3/uL (ref 150.0–400.0)
RBC: 4.96 Mil/uL (ref 4.22–5.81)
RDW: 18.6 % — ABNORMAL HIGH (ref 11.5–15.5)
WBC: 8.2 10*3/uL (ref 4.0–10.5)

## 2022-11-26 LAB — HEMOGLOBIN A1C: Hgb A1c MFr Bld: 7.1 % — ABNORMAL HIGH (ref 4.6–6.5)

## 2022-11-26 LAB — LIPID PANEL
Cholesterol: 154 mg/dL (ref 0–200)
HDL: 31.4 mg/dL — ABNORMAL LOW (ref >39.00)
NonHDL: 122.85
Total CHOL/HDL Ratio: 5
Triglycerides: 387 mg/dL — ABNORMAL HIGH (ref 0.0–149.0)
VLDL: 77.4 mg/dL — ABNORMAL HIGH (ref 0.0–40.0)

## 2022-11-26 LAB — IRON AND IRON BINDING CAPACITY (CC-WL,HP ONLY)
Iron: 82 ug/dL (ref 45–182)
Saturation Ratios: 21 % (ref 17.9–39.5)
TIBC: 388 ug/dL (ref 250–450)
UIBC: 306 ug/dL (ref 117–376)

## 2022-11-26 LAB — LACTATE DEHYDROGENASE: LDH: 144 U/L (ref 98–192)

## 2022-11-26 LAB — LDL CHOLESTEROL, DIRECT: Direct LDL: 78 mg/dL

## 2022-11-26 LAB — MICROALBUMIN / CREATININE URINE RATIO
Creatinine,U: 18.7 mg/dL
Microalb Creat Ratio: 3.7 mg/g (ref 0.0–30.0)
Microalb, Ur: <0.7 mg/dL (ref 0.0–1.9)

## 2022-11-26 LAB — VITAMIN B12: Vitamin B-12: 285 pg/mL (ref 180–914)

## 2022-11-26 LAB — FERRITIN: Ferritin: 1575 ng/mL — ABNORMAL HIGH (ref 24–336)

## 2022-11-26 MED ORDER — CYANOCOBALAMIN 1000 MCG/ML IJ SOLN
1000.0000 ug | Freq: Once | INTRAMUSCULAR | Status: AC
  Administered 2022-11-26: 1000 ug via INTRAMUSCULAR
  Filled 2022-11-26: qty 1

## 2022-11-26 NOTE — Assessment & Plan Note (Signed)
hgba1c to be checked, minimize simple carbs. Increase exercise as tolerated. Continue current meds  

## 2022-11-26 NOTE — Telephone Encounter (Signed)
Pt last saw Dr Antoine Poche 10/24/22, last labs 10/25/22 Creat 1.79, age 82, weight 105.7kg, based on specified criteria pt is on appropriate dosage of Eliquis 2.5mg  BID for afib.  Will refill rx.

## 2022-11-26 NOTE — Assessment & Plan Note (Signed)
Per cardiology 

## 2022-11-26 NOTE — Assessment & Plan Note (Signed)
Well controlled, no changes to meds. Encouraged heart healthy diet such as the DASH diet and exercise as tolerated.  °

## 2022-11-26 NOTE — Progress Notes (Addendum)
Subjective:   By signing my name below, I, Shehryar Baig, attest that this documentation has been prepared under the direction and in the presence of Donato Schultz, DO. 11/26/2022   Patient ID: Richard [name], male    DOB: May 19, 1941, 82 y.o.   MRN: [id number]  Chief Complaint  Patient presents with   Diabetes   Hyperlipidemia   Follow-up    Diabetes Pertinent negatives for hypoglycemia include no dizziness, headaches or nervousness/anxiousness. Pertinent negatives for diabetes include no chest pain and no weakness.  Hyperlipidemia Pertinent negatives include no chest pain, myalgias or shortness of breath.   Patient is in today for a office visit.   His blood pressure is low during this visit. He reports feeling good and denies any other symptoms. He continues taking 25 mg metoprolol succinate daily PO and reports no new issues while taking it. He denies any leg swelling. He continues weighing himself daily and reports no drastic change in his weight. He continues taking 40 mg torsemide daily PO and reports no new issues while taking it.  BP Readings from Last 3 Encounters:  11/26/22 100/80  10/25/22 130/63  10/24/22 120/70   Pulse Readings from Last 3 Encounters:  11/26/22 75  10/25/22 65  09/25/22 68   Wt Readings from Last 3 Encounters:  11/26/22 235 lb 9.6 oz (106.9 kg)  10/24/22 233 lb (105.7 kg)  09/25/22 233 lb 1.3 oz (105.7 kg)   He continues following up with his cardiologist for his pacemaker. He received a echocardiogram during his last visit with them and was recommended to have a new pacemaker placed.  He continues measuring his blood sugars at home and reports it typically measures around 135 prior to breakfast every morning. He continues injecting 3 mg Trulicity once every week on Wednesday.  Lab Results  Component Value Date   HGBA1C 8.1 (H) 08/28/2022    Past Medical History:  Diagnosis Date   Anemia    Arthritis    hips   Axillary adenopathy  02/25/2017   Bradycardia    a. holter monitor has demonstrated HRs in 30s and Weinkibach    CAD (coronary artery disease)    a. s/p CABG 2001  b.  07/28/2017 cath:   Severe three-vessel native CAD with total occlusion of LAD, ramus intermedius, first OM and RCA, patent RIMA to PDA, LIMA to LAD, sequential SVG to ramus intermedius and first OM.     Chronic lower back pain    Diffuse large B cell lymphoma (HCC)    Diverticulosis    Esophageal stricture    GERD (gastroesophageal reflux disease)    History of gout    HTN (hypertension)    Mixed hyperlipidemia    OSA on CPAP    with 2L O2 at night   Pancytopenia (HCC)    a. related to chemo therapy for B cell lymphoma   Peptic stricture of esophagus    Presence of permanent cardiac pacemaker    sees Dr. Clayborn Bigness pacemaker   SCC (squamous cell carcinoma) 01/31/2021   R zygoma, ED[facility]   SCC (squamous cell carcinoma) 01/31/2021   L post ankle, ED[facility]   SCC (squamous cell carcinoma) 01/31/2021   L popliteal, ED[facility]   Severe aortic stenosis    Spinal stenosis    Squamous cell carcinoma of skin 03/22/2009   Right mandible. SCCis, hypertrophic.    Squamous cell carcinoma of skin 12/25/2021   R lateral cheek, ED[facility]   Squamous cell carcinoma  of skin 12/25/2021   R postauricular neck, EDC   Squamous cell carcinoma of skin 12/25/2021   R forearm sup, EDC   Squamous cell carcinoma of skin 12/25/2021   R wrist, EDC   Type II diabetes mellitus (HCC)    Wears dentures    partial upper    Past Surgical History:  Procedure Laterality Date   APPENDECTOMY  ~ 1952   BACK SURGERY     CATARACT EXTRACTION W/PHACO Left 11/06/2016   Procedure: CATARACT EXTRACTION PHACO AND INTRAOCULAR LENS PLACEMENT (IOC);  Surgeon: Sallee Lange, MD;  Location: [facility] ORS;  Service: Ophthalmology;  Laterality: Left;  Lot # [id number] H [place]: 01:09.4 AP%:25.2 CDE: 30.64   CATARACT EXTRACTION W/PHACO Right 12/04/2016   Procedure: CATARACT EXTRACTION PHACO AND  INTRAOCULAR LENS PLACEMENT (IOC);  Surgeon: Sallee Lange, MD;  Location: [facility] ORS;  Service: Ophthalmology;  Laterality: Right;  [place] 1:25.9 AP% 24.1 CDE 39.10 Fluid Pack lot # [id number]   COLONOSCOPY W/ BIOPSIES AND POLYPECTOMY  07/02/2011   CORONARY ANGIOPLASTY  07/02/1991   CORONARY ANGIOPLASTY WITH STENT PLACEMENT  05/31/1997   "1"   CORONARY ARTERY BYPASS GRAFT  03/01/2000   "CABG X5"   ECTROPION REPAIR Right 09/01/2018   Procedure: REPAIR OF ECTROPION BILATERAL upper and lower;  Surgeon: Imagene Riches, MD;  Location: [facility];  Service: Ophthalmology;  Laterality: Right;  Diabetic - oral meds sleep apnea   ESOPHAGEAL DILATION  X 3-4   Dr. Victorino Dike; "last one was in the 1990's"   ESOPHAGOGASTRODUODENOSCOPY     multiple   FLEXIBLE SIGMOIDOSCOPY     multiple   HYDRADENITIS EXCISION Left 02/25/2017   Procedure: EXCISION DEEP LEFT AXILLARY LYMPH NODE;  Surgeon: Claud Kelp, MD;  Location: [facility] OR;  Service: General;  Laterality: Left;   INTRAOPERATIVE TRANSTHORACIC ECHOCARDIOGRAM N/A 09/09/2017   Procedure: INTRAOPERATIVE TRANSTHORACIC ECHOCARDIOGRAM;  Surgeon: Kathleene Hazel, MD;  Location: [facility] OR;  Service: Open Heart Surgery;  Laterality: N/A;   KNEE ARTHROSCOPY Left 07/01/2009   meniscus repair   LEFT HEART CATHETERIZATION WITH CORONARY/GRAFT ANGIOGRAM N/A 03/16/2014   Procedure: LEFT HEART CATHETERIZATION WITH Isabel Caprice;  Surgeon: Kathleene Hazel, MD;  Location: [facility] CATH LAB;  Service: Cardiovascular;  Laterality: N/A;   LUMBAR LAMINECTOMY/DECOMPRESSION MICRODISCECTOMY Right 06/17/2013   Procedure: LUMBAR LAMINECTOMY MICRODISCECTOMY L4-L5 RIGHT EXCISION OF SYNOVIAL CYST RIGHT   (1 LEVEL) RIGHT PARTIAL FACETECTOMY;  Surgeon: Jacki Cones, MD;  Location: [facility] ORS;  Service: Orthopedics;  Laterality: Right;   MYELOGRAM  04/06/2013   lumbar, Dr Darrelyn Hillock   ORBITAL LESION EXCISION Right 09/01/2018   Procedure: ORBITOTOMY WITHOUT BONE  FLAP WITH REMOVAL OF LESION RIGHT;  Surgeon: Imagene Riches, MD;  Location: [facility];  Service: Ophthalmology;  Laterality: Right;   PACEMAKER IMPLANT N/A 09/10/2017   Procedure: PACEMAKER IMPLANT;  Surgeon: Duke Salvia, MD;  Location: [facility] INVASIVE CV LAB;  Service: Cardiovascular;  Laterality: N/A;   PANENDOSCOPY     PERIPHERAL VASCULAR BALLOON ANGIOPLASTY Left 03/16/2021   Procedure: PERIPHERAL VASCULAR BALLOON ANGIOPLASTY;  Surgeon: Leonie Douglas, MD;  Location: [facility] INVASIVE CV LAB;  Service: Cardiovascular;  Laterality: Left;  subclavian  vein   PORTACATH PLACEMENT N/A 03/06/2017   Procedure: INSERTION PORT-A-CATH AND ASPIRATE SEROMA LEFT AXILLA;  Surgeon: Claud Kelp, MD;  Location: [facility] OR;  Service: General;  Laterality: N/A;   RIGHT/LEFT HEART CATH AND CORONARY/GRAFT ANGIOGRAPHY N/A 07/28/2017   Procedure: RIGHT/LEFT HEART CATH AND CORONARY/GRAFT ANGIOGRAPHY;  Surgeon: Tonny Bollman, MD;  Location: [facility] INVASIVE CV LAB;  Service: Cardiovascular;  Laterality: N/A;   SHOULDER SURGERY Right 08/30/2010   screws placed; "tendons tore off"   SKIN CANCER EXCISION Right    "neck"   TONSILLECTOMY  ~ 1954   TOTAL KNEE ARTHROPLASTY Left 06/29/2019   Procedure: TOTAL KNEE ARTHROPLASTY;  Surgeon: Durene Romans, MD;  Location: [facility];  Service: Orthopedics;  Laterality: Left;  70 mins   TRANSCATHETER AORTIC VALVE REPLACEMENT, TRANSFEMORAL N/A 09/09/2017   Procedure: TRANSCATHETER AORTIC VALVE REPLACEMENT, TRANSFEMORAL;  Surgeon: Kathleene Hazel, MD;  Location: [facility] OR;  Service: Open Heart Surgery;  Laterality: N/A;   UPPER EXTREMITY VENOGRAPHY Left 02/15/2021   Procedure: CENTRAL VENO;  Surgeon: Leonie Douglas, MD;  Location: [facility] INVASIVE CV LAB;  Service: Cardiovascular;  Laterality: Left;   UPPER EXTREMITY VENOGRAPHY N/A 03/16/2021   Procedure: UPPER EXTREMITY VENOGRAPHY;  Surgeon: Leonie Douglas, MD;  Location: [facility] INVASIVE CV LAB;  Service: Cardiovascular;  Laterality:  N/A;   UPPER GI ENDOSCOPY  07/02/2011   Gastritis; Dr Leone Payor   VASECTOMY     wireless pacemaker placed      Family History  Problem Relation Age of Onset   Stroke Father    Hypertension Father    Pancreatic cancer Mother    Diabetes Maternal Grandmother    Stroke Maternal Grandmother    Heart attack Paternal Grandmother    Colon cancer Neg Hx    Esophageal cancer Neg Hx    Rectal cancer Neg Hx    Stomach cancer Neg Hx    Ulcers Neg Hx     Social History   Socioeconomic History   Marital status: Divorced    Spouse name: Not on file   Number of children: 2   Years of education: college   Highest education level: Not on file  Occupational History    Employer: RETIRED  Tobacco Use   Smoking status: Never   Smokeless tobacco: Never  Vaping Use   Vaping Use: Never used  Substance and Sexual Activity   Alcohol use: No    Alcohol/week: 0.0 standard drinks of alcohol    Comment: "last drink was in 2012"( 03/15/2014)   Drug use: No   Sexual activity: Not Currently  Other Topics Concern   Not on file  Social History Narrative   ** Merged History Encounter **       Divorced, lives with a roommate. 1 son one daughter 3-4 caffeinated beverages daily Right-handed. 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   Social Determinants of Health   Financial Resource Strain: Low Risk  (02/11/2022)   Overall Financial Resource Strain (CARDIA)    Difficulty of Paying Living Expenses: Not very hard  Food Insecurity: No Food Insecurity (04/25/2022)   Hunger Vital Sign    Worried About Running Out of Food in the Last Year: Never true    Ran Out of Food in the Last Year: Never true  Transportation Needs: No Transportation Needs (04/25/2022)   PRAPARE - Administrator, Civil Service (Medical): No    Lack of Transportation (Non-Medical): No  Physical  Activity: Insufficiently Active (02/11/2022)   Exercise Vital Sign    Days of Exercise per Week: 5 days    Minutes of Exercise per Session: 10 min  Stress: Not on file  Social Connections: Moderately Isolated (09/06/2021)   Social  Connection and Isolation Panel [NHANES]    Frequency of Communication with Friends and Family: More than three times a week    Frequency of Social Gatherings with Friends and Family: More than three times a week    Attends Religious Services: More than 4 times per year    Active Member of [employer] or Organizations: No    Attends Banker Meetings: Never    Marital Status: Divorced  Catering manager Violence: Not At Risk (04/25/2022)   Humiliation, Afraid, Rape, and Kick questionnaire    Fear of Current or Ex-Partner: No    Emotionally Abused: No    Physically Abused: No    Sexually Abused: No    Outpatient Medications Prior to Visit  Medication Sig Dispense Refill   acetaminophen (TYLENOL) 325 MG tablet Take 650 mg by mouth at bedtime as needed for moderate pain or headache.     allopurinol (ZYLOPRIM) 300 MG tablet Take 450 mg by mouth daily.     aluminum hydroxide-magnesium carbonate (GAVISCON) 95-358 MG/15ML SUSP Take 15 mLs by mouth as needed for indigestion or heartburn.     apixaban (ELIQUIS) 2.5 MG TABS tablet TAKE 1 TABLET BY MOUTH TWICE A DAY 180 tablet 2   Ascorbic Acid (VITAMIN C PO) Take 1 tablet by mouth daily with breakfast.     atorvastatin (LIPITOR) 80 MG tablet TAKE 1 TABLET BY MOUTH EVERYDAY AT BEDTIME (Patient taking differently: Take 80 mg by mouth at bedtime.) 90 tablet 3   Azelastine HCl 137 MCG/SPRAY SOLN PLACE 2 SPRAYS INTO BOTH NOSTRILS AT BEDTIME AS NEEDED FOR RHINITIS OR ALLERGIES. 90 mL 1   Coenzyme Q10 (CO Q-10 PO) Take 1 capsule by mouth daily.     Dulaglutide (TRULICITY) 3 MG/0.5ML SOPN Inject 3 mg into the skin once a week. 2 mL 1   esomeprazole (NEXIUM) 40 MG capsule Take 1 capsule (40 mg total) by mouth daily. (Patient  taking differently: Take 40 mg by mouth daily before breakfast.) 30 capsule 5   ezetimibe (ZETIA) 10 MG tablet Take 1 tablet (10 mg total) by mouth daily. 100 tablet 3   FARXIGA 10 MG TABS tablet Take 10 mg by mouth in the morning.     fenofibrate 160 MG tablet TAKE 1 TABLET BY MOUTH EVERY DAY 90 tablet 1   folic acid (FOLVITE) 1 MG tablet TAKE 2 TABLETS BY MOUTH EVERY DAY 180 tablet 4   glimepiride (AMARYL) 2 MG tablet TAKE 1 TABLET (2 MG TOTAL) BY MOUTH IN THE MORNING AND AT BEDTIME. 180 tablet 1   glucose blood (FREESTYLE LITE) test strip USE TO TEST BLOOD SUGAR ONCE A DAY. DX CODE: E11.9 100 strip 12   icosapent Ethyl (VASCEPA) 1 g capsule TAKE 2 CAPSULES BY MOUTH 2 TIMES DAILY. 360 capsule 1   Lancets (FREESTYLE) lancets USE ONCE A DAY TO CHECK BLOOD SUGAR. 100 each 12   Menthol-Camphor (ICY HOT ADVANCED PAIN RELIEF EX) Apply 1 application  topically daily as needed (to painful sites).     metoprolol succinate (TOPROL-XL) 25 MG 24 hr tablet Take 1 tablet (25 mg total) by mouth daily. 90 tablet 3   nitroGLYCERIN (NITROSTAT) 0.4 MG SL tablet PLACE 1 TABLET UNDER THE TONGUE EVERY 5 MINUTES AS NEEDED FOR CHEST PAIN. 25 tablet 6   potassium chloride SA (KLOR-CON M20) 20 MEQ tablet Take 2 tablets (40 mEq total) by mouth daily. 180 tablet 2   pregabalin (LYRICA) 100 MG capsule Take 1 capsule (100 mg total) by  mouth 2 (two) times daily. 60 capsule 2   psyllium (METAMUCIL) 58.6 % powder Take 1 packet by mouth daily as needed (for constipation- mix and drink).     senna (SENOKOT) 8.6 MG TABS tablet Take 2 tablets (17.2 mg total) by mouth at bedtime. This is for constipation.  Stop taking if you start having diarrhea. (Patient taking differently: Take 2 tablets by mouth daily as needed for mild constipation. This is for constipation.  Stop taking if you start having diarrhea.) 20 tablet 0   torsemide (DEMADEX) 20 MG tablet TAKE 2 TABLETS (40 MG TOTAL) BY MOUTH IN THE MORNING. DO NOT TAKE ON 01/24/2022. 180  tablet 1   No facility-administered medications prior to visit.    Allergies  Allergen Reactions   Ace Inhibitors Swelling and Other (See Comments)    Angioedema    Benazepril Swelling and Other (See Comments)    Angioedema; he is not a candidate for any angiotensin receptor blockers because of this significant allergic reaction. Because of a history of documented adverse serious drug reaction;Medi Alert bracelet  is recommended   Entresto [Sacubitril-Valsartan] Swelling and Other (See Comments)    First-in-Class Angiotensin Receptor Neprilysin Inhibitor- Med was "red-flagged" by the patient's pharmacy for him to NOT take!   Hctz [Hydrochlorothiazide] Anaphylaxis and Swelling    Tongue and lip swelling    Aspirin Other (See Comments)    Gastritis, can aspirin not take 325 mg aspirin    Review of Systems  Constitutional:  Negative for chills, fever and malaise/fatigue.  HENT:  Negative for congestion and hearing loss.   Eyes:  Negative for discharge.  Respiratory:  Negative for cough, sputum production and shortness of breath.   Cardiovascular:  Negative for chest pain, palpitations and leg swelling.  Gastrointestinal:  Negative for abdominal pain, blood in stool, constipation, diarrhea, heartburn, nausea and vomiting.  Genitourinary:  Negative for dysuria, frequency, hematuria and urgency.  Musculoskeletal:  Negative for back pain, falls and myalgias.  Skin:  Negative for rash.  Neurological:  Negative for dizziness, sensory change, loss of consciousness, weakness and headaches.  Endo/Heme/Allergies:  Negative for environmental allergies. Does not bruise/bleed easily.  Psychiatric/Behavioral:  Negative for depression and suicidal ideas. The patient is not nervous/anxious and does not have insomnia.        Objective:    Physical Exam Vitals and nursing note reviewed.  Constitutional:      General: He is not in acute distress.    Appearance: Normal appearance. He is not  ill-appearing.  HENT:     Head: Normocephalic and atraumatic.     Right Ear: External ear normal.     Left Ear: External ear normal.  Eyes:     Extraocular Movements: Extraocular movements intact.     Pupils: Pupils are equal, round, and reactive to light.  Cardiovascular:     Rate and Rhythm: Normal rate and regular rhythm.     Heart sounds: Normal heart sounds. No murmur heard.    No gallop.  Pulmonary:     Effort: Pulmonary effort is normal. No respiratory distress.     Breath sounds: Normal breath sounds. No wheezing or rales.  Musculoskeletal:        General: Normal range of motion.  Skin:    General: Skin is warm and dry.  Neurological:     Mental Status: He is alert and oriented to person, place, and time.  Psychiatric:        Mood and Affect: Mood normal.  Judgment: Judgment normal.     BP 100/80 (BP Location: Left Arm, Patient Position: Sitting, Cuff Size: Large)   Pulse 75   Temp 97.8 F (36.6 C) (Oral)   Resp 20   Ht 6' (1.829 m)   Wt 235 lb 9.6 oz (106.9 kg)   SpO2 95%   BMI 31.95 kg/m  Wt Readings from Last 3 Encounters:  11/26/22 235 lb 9.6 oz (106.9 kg)  10/24/22 233 lb (105.7 kg)  09/25/22 233 lb 1.3 oz (105.7 kg)       Assessment & Plan:  Type 2 DM with CKD stage 3 and hypertension (HCC) -     Comprehensive metabolic panel -     Hemoglobin A1c -     Microalbumin / creatinine urine ratio  Hyperlipidemia associated with type 2 diabetes mellitus (HCC) Assessment & Plan: Tolerating statin, encouraged heart healthy diet, avoid trans fats, minimize simple carbs and saturated fats. Increase exercise as tolerated   Orders: -     Lipid panel -     Comprehensive metabolic panel  Essential hypertension Assessment & Plan: Well controlled, no changes to meds. Encouraged heart healthy diet such as the DASH diet and exercise as tolerated.    Orders: -     Lipid panel -     CBC with Differential/Platelet -     Comprehensive metabolic panel -      Hemoglobin A1c  Chronic renal insufficiency, stage 3 (moderate) (HCC)  Coronary artery disease involving native coronary artery of native heart with other form of angina pectoris (HCC) -     Lipid panel -     CBC with Differential/Platelet -     Comprehensive metabolic panel -     Hemoglobin A1c  Chronic diastolic heart failure (HCC) -     Lipid panel -     CBC with Differential/Platelet -     Comprehensive metabolic panel -     Hemoglobin A1c  Iron deficiency anemia due to chronic blood loss Assessment & Plan: Per hematology   Aortic stenosis, severe Assessment & Plan: Per cardiology   Chronic combined systolic and diastolic heart failure (HCC) Assessment & Plan: Per cardiology Pt will get a new pacemaker    Type 2 diabetes mellitus without complication, without long-term current use of insulin (HCC) Assessment & Plan: hgba1c to be checked,  minimize simple carbs. Increase exercise as tolerated. Continue current meds    Hyperlipidemia, unspecified hyperlipidemia type Assessment & Plan: Tolerating statin, encouraged heart healthy diet, avoid trans fats, minimize simple carbs and saturated fats. Increase exercise as tolerated      I, Donato Schultz, DO, personally preformed the services described in this documentation.  All medical record entries made by the scribe were at my direction and in my presence.  I have reviewed the chart and discharge instructions (if applicable) and agree that the record reflects my personal performance and is accurate and complete. 11/26/2022   I,Shehryar Baig,acting as a scribe for Donato Schultz, DO.,have documented all relevant documentation on the behalf of Donato Schultz, DO,as directed by  Donato Schultz, DO while in the presence of Donato Schultz, DO.   Donato Schultz, DO

## 2022-11-26 NOTE — Progress Notes (Signed)
Hematology and Oncology Follow Up Visit  Richard [name] [id number] Jul 05, 1940 82 y.o. 11/26/2022   Principle Diagnosis:  Diffuse large cell non-Hodgkin's lymphoma (IPI = 3) - NOT "double hit" Pernicious anemia Iron deficiency secondary to bleeding   Past Therapy: R-CHOP - s/p cycle 8 - completed 08/2017   Current Therapy:        Vitamin B12 1 mg IM every month Xgeva 120 mg subcu q 3 months - next dose due in 12/2022 IV iron as indicated   Interim History:  Richard [name] is here today for follow-up and B 12 injection. He is doing well and has no complaints at this time.  He states that he will be having his pacemaker replaced and is waiting to schedule the procedure.  He has not noted any blood loss. No petechiae.  No fever, chills, n/v, cough, rash, SOB, chest pain, palpitations, abdominal pain or changes in bowel or bladder habits.  He notes dizziness and balance issues if he stands too quickly so he is careful when getting up.  No swelling in his extremities.  Neuropathy in his lower extremities unchanged from baseline.  No falls or syncope reported.  Appetite and hydration are good. Weight is stable at 235 lbs.   ECOG Performance Status: 1 - Symptomatic but completely ambulatory  Medications:  Allergies as of 11/26/2022       Reactions   Ace Inhibitors Swelling, Other (See Comments)   Angioedema   Benazepril Swelling, Other (See Comments)   Angioedema; he is not a candidate for any angiotensin receptor blockers because of this significant allergic reaction. Because of a history of documented adverse serious drug reaction;Medi Alert bracelet  is recommended   Entresto [sacubitril-valsartan] Swelling, Other (See Comments)   First-in-Class Angiotensin Receptor Neprilysin Inhibitor- Med was "red-flagged" by the patient's pharmacy for him to NOT take!   Hctz [hydrochlorothiazide] Anaphylaxis, Swelling   Tongue and lip swelling   Aspirin Other (See Comments)   Gastritis, can  aspirin not take 325 mg aspirin        Medication List        Accurate as of Nov 26, 2022 12:42 PM. If you have any questions, ask your nurse or doctor.          acetaminophen 325 MG tablet Commonly known as: TYLENOL Take 650 mg by mouth at bedtime as needed for moderate pain or headache.   allopurinol 300 MG tablet Commonly known as: ZYLOPRIM Take 450 mg by mouth daily.   aluminum hydroxide-magnesium carbonate 95-358 MG/15ML Susp Commonly known as: GAVISCON Take 15 mLs by mouth as needed for indigestion or heartburn.   atorvastatin 80 MG tablet Commonly known as: LIPITOR TAKE 1 TABLET BY MOUTH EVERYDAY AT BEDTIME What changed: See the new instructions.   Azelastine HCl 137 MCG/SPRAY Soln PLACE 2 SPRAYS INTO BOTH NOSTRILS AT BEDTIME AS NEEDED FOR RHINITIS OR ALLERGIES.   CO Q-10 PO Take 1 capsule by mouth daily.   Eliquis 2.5 MG Tabs tablet Generic drug: apixaban TAKE 1 TABLET BY MOUTH TWICE A DAY   esomeprazole 40 MG capsule Commonly known as: NexIUM Take 1 capsule (40 mg total) by mouth daily. What changed: when to take this   ezetimibe 10 MG tablet Commonly known as: ZETIA Take 1 tablet (10 mg total) by mouth daily.   Farxiga 10 MG Tabs tablet Generic drug: dapagliflozin propanediol Take 10 mg by mouth in the morning.   fenofibrate 160 MG tablet TAKE 1 TABLET BY MOUTH EVERY DAY  folic acid 1 MG tablet Commonly known as: FOLVITE TAKE 2 TABLETS BY MOUTH EVERY DAY   freestyle lancets USE ONCE A DAY TO CHECK BLOOD SUGAR.   FREESTYLE LITE test strip Generic drug: glucose blood USE TO TEST BLOOD SUGAR ONCE A DAY. DX CODE: E11.9   glimepiride 2 MG tablet Commonly known as: AMARYL TAKE 1 TABLET (2 MG TOTAL) BY MOUTH IN THE MORNING AND AT BEDTIME.   ICY HOT ADVANCED PAIN RELIEF EX Apply 1 application  topically daily as needed (to painful sites).   metoprolol succinate 25 MG 24 hr tablet Commonly known as: TOPROL-XL Take 1 tablet (25 mg total)  by mouth daily.   nitroGLYCERIN 0.4 MG SL tablet Commonly known as: NITROSTAT PLACE 1 TABLET UNDER THE TONGUE EVERY 5 MINUTES AS NEEDED FOR CHEST PAIN.   potassium chloride SA 20 MEQ tablet Commonly known as: Klor-Con M20 Take 2 tablets (40 mEq total) by mouth daily.   pregabalin 100 MG capsule Commonly known as: Lyrica Take 1 capsule (100 mg total) by mouth 2 (two) times daily.   psyllium 58.6 % powder Commonly known as: METAMUCIL Take 1 packet by mouth daily as needed (for constipation- mix and drink).   senna 8.6 MG Tabs tablet Commonly known as: SENOKOT Take 2 tablets (17.2 mg total) by mouth at bedtime. This is for constipation.  Stop taking if you start having diarrhea. What changed:  when to take this reasons to take this   torsemide 20 MG tablet Commonly known as: DEMADEX TAKE 2 TABLETS (40 MG TOTAL) BY MOUTH IN THE MORNING. DO NOT TAKE ON 01/24/2022.   Trulicity 3 MG/0.5ML Sopn Generic drug: Dulaglutide Inject 3 mg into the skin once a week.   Vascepa 1 g capsule Generic drug: icosapent Ethyl TAKE 2 CAPSULES BY MOUTH 2 TIMES DAILY.   VITAMIN C PO Take 1 tablet by mouth daily with breakfast.        Allergies:  Allergies  Allergen Reactions   Ace Inhibitors Swelling and Other (See Comments)    Angioedema    Benazepril Swelling and Other (See Comments)    Angioedema; he is not a candidate for any angiotensin receptor blockers because of this significant allergic reaction. Because of a history of documented adverse serious drug reaction;Medi Alert bracelet  is recommended   Entresto [Sacubitril-Valsartan] Swelling and Other (See Comments)    First-in-Class Angiotensin Receptor Neprilysin Inhibitor- Med was "red-flagged" by the patient's pharmacy for him to NOT take!   Hctz [Hydrochlorothiazide] Anaphylaxis and Swelling    Tongue and lip swelling    Aspirin Other (See Comments)    Gastritis, can aspirin not take 325 mg aspirin    Past Medical History,  Surgical history, Social history, and Family History were reviewed and updated.  Review of Systems: All other 10 point review of systems is negative.   Physical Exam:  vitals were not taken for this visit.   Wt Readings from Last 3 Encounters:  11/26/22 235 lb 9.6 oz (106.9 kg)  10/24/22 233 lb (105.7 kg)  09/25/22 233 lb 1.3 oz (105.7 kg)    Ocular: Sclerae unicteric, pupils equal, round and reactive to light Ear-nose-throat: Oropharynx clear, dentition fair Lymphatic: No cervical or supraclavicular adenopathy Lungs no rales or rhonchi, good excursion bilaterally Heart regular rate and rhythm, no murmur appreciated Abd soft, nontender, positive bowel sounds MSK no focal spinal tenderness, no joint edema Neuro: non-focal, well-oriented, appropriate affect Breasts: Deferred   Lab Results  Component Value Date  WBC 7.7 10/25/2022   HGB 14.3 10/25/2022   HCT 44.5 10/25/2022   MCV 90.4 10/25/2022   PLT 179 10/25/2022   Lab Results  Component Value Date   FERRITIN 1,315 (H) 10/25/2022   IRON 103 10/25/2022   TIBC 402 10/25/2022   UIBC 299 10/25/2022   IRONPCTSAT 26 10/25/2022   Lab Results  Component Value Date   RETICCTPCT 2.0 10/25/2022   RBC 4.86 10/25/2022   RETICCTABS 52.1 11/22/2011   No results found for: "KPAFRELGTCHN", "LAMBDASER", "[facility]" Lab Results  Component Value Date   IGA 159 03/09/2012   Lab Results  Component Value Date   ALBUMINELP 4.2 07/27/2018   MSPIKE Not Observed 07/27/2018     Chemistry      Component Value Date/Time   NA 140 10/25/2022 1211   NA 138 02/08/2022 0811   NA 144 06/27/2017 0857   K 4.3 10/25/2022 1211   K 3.9 06/27/2017 0857   CL 102 10/25/2022 1211   CL 103 06/27/2017 0857   CO2 31 10/25/2022 1211   CO2 26 06/27/2017 0857   BUN 41 (H) 10/25/2022 1211   BUN 46 (H) 02/08/2022 0811   BUN 12 06/27/2017 0857   CREATININE 1.79 (H) 10/25/2022 1211   CREATININE 1.65 (H) 04/18/2020 0956      Component Value  Date/Time   CALCIUM 9.7 10/25/2022 1211   CALCIUM 9.2 06/27/2017 0857   ALKPHOS 61 10/25/2022 1211   ALKPHOS 128 (H) 06/27/2017 0857   AST 19 10/25/2022 1211   ALT 19 10/25/2022 1211   ALT 24 06/27/2017 0857   BILITOT 0.6 10/25/2022 1211       Impression and Plan: Mr. Dornfeld is a very pleasant 82 yo caucasian gentleman with diffuse large B-cell lymphoma (not "double hit" lymphoma). He completed treatment in February 2019.  Iron studies pending.  B 12 given today as planned.  Follow-up in 1 month.    Eileen Stanford, NP 5/28/202412:42 PM

## 2022-11-26 NOTE — Assessment & Plan Note (Signed)
Tolerating statin, encouraged heart healthy diet, avoid trans fats, minimize simple carbs and saturated fats. Increase exercise as tolerated 

## 2022-11-26 NOTE — Assessment & Plan Note (Signed)
Per cardiology Pt will get a new pacemaker

## 2022-11-26 NOTE — Assessment & Plan Note (Signed)
Per hematology 

## 2022-11-26 NOTE — Patient Instructions (Signed)

## 2022-11-26 NOTE — Patient Instructions (Signed)

## 2022-11-27 DIAGNOSIS — I442 Atrioventricular block, complete: Secondary | ICD-10-CM | POA: Diagnosis not present

## 2022-11-27 DIAGNOSIS — I5020 Unspecified systolic (congestive) heart failure: Secondary | ICD-10-CM | POA: Diagnosis not present

## 2022-11-27 DIAGNOSIS — N2581 Secondary hyperparathyroidism of renal origin: Secondary | ICD-10-CM | POA: Diagnosis not present

## 2022-11-27 DIAGNOSIS — I129 Hypertensive chronic kidney disease with stage 1 through stage 4 chronic kidney disease, or unspecified chronic kidney disease: Secondary | ICD-10-CM | POA: Diagnosis not present

## 2022-11-27 DIAGNOSIS — N1832 Chronic kidney disease, stage 3b: Secondary | ICD-10-CM | POA: Diagnosis not present

## 2022-11-27 DIAGNOSIS — D631 Anemia in chronic kidney disease: Secondary | ICD-10-CM | POA: Diagnosis not present

## 2022-11-27 DIAGNOSIS — Z45018 Encounter for adjustment and management of other part of cardiac pacemaker: Secondary | ICD-10-CM | POA: Diagnosis not present

## 2022-12-03 DIAGNOSIS — I442 Atrioventricular block, complete: Secondary | ICD-10-CM | POA: Diagnosis not present

## 2022-12-03 DIAGNOSIS — Z45018 Encounter for adjustment and management of other part of cardiac pacemaker: Secondary | ICD-10-CM | POA: Diagnosis not present

## 2022-12-09 ENCOUNTER — Ambulatory Visit: Payer: Medicare Other | Admitting: Pharmacist

## 2022-12-09 DIAGNOSIS — I25118 Atherosclerotic heart disease of native coronary artery with other forms of angina pectoris: Secondary | ICD-10-CM

## 2022-12-09 DIAGNOSIS — E1169 Type 2 diabetes mellitus with other specified complication: Secondary | ICD-10-CM

## 2022-12-09 DIAGNOSIS — E1122 Type 2 diabetes mellitus with diabetic chronic kidney disease: Secondary | ICD-10-CM

## 2022-12-09 NOTE — Progress Notes (Signed)
Pharmacy Note  12/09/2022 Name: Richard [name] MRN: [id number] DOB: April 01, 1941  Subjective: Richard [name] is a 82 y.o. year old male who is a primary care patient of Zola Button, Grayling Congress, DO. Clinical Pharmacist Practitioner referral was placed to assist with medication and diabetes management.    Engaged with patient by telephone for follow up visit today.  Type 2 DM -  Current therapy - Trulicity 3mg  weekly, glimepiride 2mg  - take 2 tablets = 4mg  daily and Farxiga 10mg  daily.  Past therapies: metformin stopped due to rising Scr.  Initially recommended Ozempic instead of Trulicity but Trulicity was preferred by patient's insurance.   Increased Trulicity from 1.5mg  to 3mg  08/28/2022. Patient is tolerating well. Blood glucose has improved from 190's.  He does report that he is in the coverage gap and cost of Trulicity is about $245 per month. At his last PCP visit he was given application for [facility] but has not completed yet. I believe that the [facility] program has not been accepting new enrollees for Trulicity for 2024 though.   Recent home blood glucose:  Highest - 150; lowest - 114  Wt Readings from Last 3 Encounters:  11/26/22 235 lb 1.3 oz (106.6 kg)  11/26/22 235 lb 9.6 oz (106.9 kg)  10/24/22 233 lb (105.7 kg)   Weight 1 year ago 10/26/2021 was 251lbs.  (Has lost about 16 lbs since starting GLP1  Diet - has been able to control appetite better since starting Trulicity. He is limiting sweets and sweetened beverages  Exercise - golfs 18 holes 2 or 3 times per week. States he feels great.   Medication management:  Enrolled in Hyperlipidemia and CHF / Cardiomyopathy [employer] funds which covers all his cholesterol medications and heart medications. Patient reports he will have surgery to replace his pacemaker in October 2024.  At our lat visit he c/o drowsiness during the day which was felt to be related to Lyrica. He was taking 150mg  twice a day but has been taking  100mg  at noon and 150mg  at bedtime. Patient report daytime drowsiness has improved with this change in dose.   Objective: Review of patient status, including review of consultants reports, laboratory and other test data, was performed as part of comprehensive.  Lab Results  Component Value Date   CREATININE 1.99 (H) 11/26/2022   CREATININE 1.80 (H) 11/26/2022   CREATININE 1.79 (H) 10/25/2022    Lab Results  Component Value Date   HGBA1C 7.1 (H) 11/26/2022       Component Value Date/Time   CHOL 154 11/26/2022 1006   CHOL 162 11/23/2020 0828   TRIG 387.0 (H) 11/26/2022 1006   HDL 31.40 (L) 11/26/2022 1006   HDL 38 (L) 11/23/2020 0828   CHOLHDL 5 11/26/2022 1006   VLDL 77.4 (H) 11/26/2022 1006   LDLCALC 76 01/25/2022 1218   LDLCALC 89 11/23/2020 0828   LDLCALC  04/18/2020 0956     Comment:     . LDL cholesterol not calculated. Triglyceride levels greater than 400 mg/dL invalidate calculated LDL results. . Reference range: <100 . Desirable range <100 mg/dL for primary prevention;   <70 mg/dL for patients with CHD or diabetic patients  with > or = 2 CHD risk factors. [name] LDL-C is now calculated using the Martin-Hopkins  calculation, which is a validated novel method providing  better accuracy than the Friedewald equation in the  estimation of LDL-C.  Horald Pollen et al. Lenox Ahr. 1914;782(95): 2061-2068  (http://education.[employer].com/faq/FAQ164)  LDLDIRECT 78.0 11/26/2022 1006     Clinical ASCVD: Yes  The ASCVD Risk score (Arnett DK, et al., 2019) failed to calculate for the following reasons:   The 2019 ASCVD risk score is only valid for ages 33 to 50   The patient has a prior MI or stroke diagnosis    BP Readings from Last 3 Encounters:  11/26/22 131/60  11/26/22 100/80  10/25/22 130/63     Allergies  Allergen Reactions   Ace Inhibitors Swelling and Other (See Comments)    Angioedema    Benazepril Swelling and Other (See Comments)    Angioedema; he  is not a candidate for any angiotensin receptor blockers because of this significant allergic reaction. Because of a history of documented adverse serious drug reaction;Medi Alert bracelet  is recommended   Entresto [Sacubitril-Valsartan] Swelling and Other (See Comments)    First-in-Class Angiotensin Receptor Neprilysin Inhibitor- Med was "red-flagged" by the patient's pharmacy for him to NOT take!   Hctz [Hydrochlorothiazide] Anaphylaxis and Swelling    Tongue and lip swelling    Aspirin Other (See Comments)    Gastritis, can aspirin not take 325 mg aspirin    Medications Reviewed Today     Reviewed by Henrene Pastor, RPH-CPP (Pharmacist) on 12/09/22 at 1337  Med List Status: <None>   Medication Order Taking? Sig Documenting Provider Last Dose Status Informant  acetaminophen (TYLENOL) 325 MG tablet [id number] Yes Take 650 mg by mouth at bedtime as needed for moderate pain or headache. [provider] Taking Active Self  allopurinol (ZYLOPRIM) 300 MG tablet [id number] Yes Take 450 mg by mouth daily. [provider] Taking Active Self           Med Note Antony Madura, Arn Medal   Wed Jan 16, 2022  2:57 PM) 1.5 tablets = 450 mg  aluminum hydroxide-magnesium carbonate (GAVISCON) 95-358 MG/15ML SUSP [id number] Yes Take 15 mLs by mouth as needed for indigestion or heartburn. [provider] Taking Active Self  apixaban (ELIQUIS) 2.5 MG TABS tablet [id number] Yes TAKE 1 TABLET BY MOUTH TWICE A Kenn File, MD Taking Active   Ascorbic Acid (VITAMIN C PO) [id number] Yes Take 1 tablet by mouth daily with breakfast. [provider] Taking Active Self  atorvastatin (LIPITOR) 80 MG tablet [id number] Yes TAKE 1 TABLET BY MOUTH EVERYDAY AT BEDTIME  Patient taking differently: Take 80 mg by mouth at bedtime.   Rollene Rotunda, MD Taking Active Self  Azelastine HCl 137 MCG/SPRAY SOLN [id number] Yes PLACE 2 SPRAYS INTO BOTH NOSTRILS AT BEDTIME AS NEEDED FOR RHINITIS OR ALLERGIES.  Seabron Spates R, DO Taking Active   Coenzyme Q10 (CO Q-10 PO) [id number] Yes Take 1 capsule by mouth daily. [provider] Taking Active Self  Dulaglutide (TRULICITY) 3 MG/0.5ML SOPN [id number] Yes Inject 3 mg into the skin once a week. Donato Schultz, DO Taking Active   esomeprazole (NEXIUM) 40 MG capsule [id number] Yes Take 1 capsule (40 mg total) by mouth daily.  Patient taking differently: Take 40 mg by mouth daily before breakfast.   Pecola Lawless, MD Taking Active Self  ezetimibe (ZETIA) 10 MG tablet [id number] Yes Take 1 tablet (10 mg total) by mouth daily. Seabron Spates R, DO Taking Active   FARXIGA 10 MG TABS tablet [id number] Yes Take 10 mg by mouth in the morning. [provider] Taking Active Self  fenofibrate 160 MG tablet [id number] Yes TAKE 1 TABLET BY MOUTH EVERY DAY Zola Button, Grayling Congress, DO  Taking Active   folic acid (FOLVITE) 1 MG tablet [id number] Yes TAKE 2 TABLETS BY MOUTH EVERY DAY Ennever, Rose Phi, MD Taking Active   glimepiride (AMARYL) 2 MG tablet [id number] Yes TAKE 1 TABLET (2 MG TOTAL) BY MOUTH IN THE MORNING AND AT BEDTIME. Seabron Spates R, DO Taking Active   glucose blood (FREESTYLE LITE) test strip [id number] Yes USE TO TEST BLOOD SUGAR ONCE A DAY. DX CODE: E11.9 Donato Schultz, DO Taking Active   icosapent Ethyl (VASCEPA) 1 g capsule [id number] Yes TAKE 2 CAPSULES BY MOUTH 2 TIMES DAILY. Zola Button, Grayling Congress, DO Taking Active   Lancets (FREESTYLE) lancets [id number] Yes USE ONCE A DAY TO CHECK BLOOD SUGAR. Donato Schultz, DO Taking Active   Menthol-Camphor (ICY HOT ADVANCED PAIN RELIEF EX) [id number] Yes Apply 1 application  topically daily as needed (to painful sites). [provider] Taking Active Self  metoprolol succinate (TOPROL-XL) 25 MG 24 hr tablet [id number] Yes Take 1 tablet (25 mg total) by mouth daily. Rollene Rotunda, MD Taking Active Self  nitroGLYCERIN (NITROSTAT) 0.4 MG SL tablet [id number] Yes  PLACE 1 TABLET UNDER THE TONGUE EVERY 5 MINUTES AS NEEDED FOR CHEST PAIN. Rollene Rotunda, MD Taking Active   potassium chloride SA (KLOR-CON M20) 20 MEQ tablet [id number] Yes Take 2 tablets (40 mEq total) by mouth daily. Rollene Rotunda, MD Taking Active   pregabalin (LYRICA) 100 MG capsule [id number] Yes Take 1 capsule (100 mg total) by mouth 2 (two) times daily.  Patient taking differently: Take 100 mg by mouth daily. At noon   [facility], Grayling Congress, DO Taking Active   pregabalin (LYRICA) 150 MG capsule [id number] Yes Take 150 mg by mouth at bedtime. (Take 100mg  daily at noon) [provider] Taking Active   psyllium (METAMUCIL) 58.6 % powder [id number] Yes Take 1 packet by mouth daily as needed (for constipation- mix and drink). [provider] Taking Active Self  senna (SENOKOT) 8.6 MG TABS tablet [id number] Yes Take 2 tablets (17.2 mg total) by mouth at bedtime. This is for constipation.  Stop taking if you start having diarrhea.  Patient taking differently: Take 2 tablets by mouth daily as needed for mild constipation. This is for constipation.  Stop taking if you start having diarrhea.   Elease Etienne, MD Taking Active Self  torsemide (DEMADEX) 20 MG tablet [id number] Yes TAKE 2 TABLETS (40 MG TOTAL) BY MOUTH IN THE MORNING. DO NOT TAKE ON 01/24/2022. Marcelino Duster, PA Taking Active   Med List Note Mathews Robinsons, CPhT 06/10/13 4627): cpap machine 2 L of oxygen at night            Patient Active Problem List   Diagnosis Date Noted   Permanent atrial fibrillation (HCC) 10/23/2022   Simple chronic bronchitis (HCC) 06/03/2022   Cellulitis of lower extremity 04/30/2022   Type 2 DM with CKD stage 3 and hypertension (HCC) 04/30/2022   Cellulitis of right leg 04/24/2022   Stage 3b chronic kidney disease (CKD) (HCC) 04/24/2022   Lactic acidosis 04/24/2022   Left upper quadrant pain 04/08/2022   Subacute cough 04/08/2022   Chronic combined systolic and diastolic  heart failure (HCC) 03/50/0938   Acute exacerbation of CHF (congestive heart failure) (HCC) 01/16/2022   Hemoptysis 12/26/2021   Subclavian vein stenosis 12/23/2021   Strep throat 11/16/2021   Multiple superficial wounds with infection 01/04/2021   Cardiac pacemaker in situ 11/20/2020   S/P CABG x 4 11/20/2020  Ascending aortic aneurysm (HCC) 11/20/2020   Senile purpura (HCC) 07/27/2020   Porokeratosis 06/20/2020   Pain due to onychomycosis of toenails of both feet 12/14/2019   Educated about COVID-19 virus infection 11/25/2019   DM2 (diabetes mellitus, type 2) (HCC) 08/10/2019   Hyperlipidemia associated with type 2 diabetes mellitus (HCC) 08/10/2019   S/P left TKA 06/29/2019   Peripheral neuropathy 07/27/2018   Uncontrolled type 2 diabetes mellitus with hyperglycemia (HCC) 05/13/2018   Diabetic peripheral neuropathy associated with type 2 diabetes mellitus (HCC) 05/13/2018   Pansinusitis 05/13/2018   Severe aortic stenosis    Complete heart block (HCC)    Paroxysmal atrial fibrillation (HCC)    Acute on chronic diastolic CHF (congestive heart failure), NYHA class 2 (HCC)    Left arm swelling    Status post transcatheter aortic valve replacement (TAVR) using bioprosthesis 09/09/2017   Demand ischemia of myocardium    Typical atrial flutter (HCC)    Acute on chronic diastolic heart failure (HCC)    GIB (gastrointestinal bleeding) 08/16/2017   Hyperlipidemia 08/05/2017   NSTEMI (non-ST elevated myocardial infarction) (HCC)    Aortic stenosis, severe 07/25/2017   Pancytopenia (HCC) 07/24/2017   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) 02/27/2017   Bradycardia 01/04/2017   Coronary artery disease 01/04/2017   Stenosis of carotid artery 01/04/2017   Obesity 09/18/2016   Wenckebach block    Disorder associated with type II diabetes mellitus (HCC) 05/21/2013   Spinal stenosis of lumbar region 04/08/2013   Anemia, iron deficiency 11/29/2011   B12  deficiency anemia 07/30/2011   OSA (obstructive sleep apnea) 07/06/2010   CAD, ARTERY BYPASS GRAFT 08/22/2009   Essential hypertension 03/29/2009   Bilateral lower extremity edema 02/21/2009   Hyperlipidemia LDL goal <70 11/26/2008   GOUT 08/17/2006     Medication Assistance:   High med cost when in coverage gap. Screened for medication assistance program in past - patient did not qualify. [employer] for hypercholesterolemia. Approved for $2500 thru 06/03/2023  Called with patient to [facility]. They are still not taking new enrollees for Trulicity. Discussed possibly changing to Ozempic but patient's yearly income was above 400% FLP cut off. He states he is close to being in the catastrophic coverage when monthly cost would be about  $55 / month.   Assessment / Plan: Type 2 DM - not at goal Continue Trulicity 3mg  weekly, glimepiride 2mg  twice a day and Farxiga 10mg  daily.  Continue to check blood glucose once a day  Medication management:  Reviewed and updated medication list - continue to take Lyrica 100mg  at noon and Lyrica 150mg  at bedtime Reviewed refill history and adherence .  Follow Up:  Telephone follow up appointment with care management team member scheduled for:  1 to 2 months   Henrene Pastor, PharmD Clinical Pharmacist [facility]  - [facility] [phone]

## 2022-12-10 ENCOUNTER — Other Ambulatory Visit: Payer: Self-pay | Admitting: Cardiology

## 2022-12-12 ENCOUNTER — Other Ambulatory Visit: Payer: Self-pay | Admitting: Cardiology

## 2022-12-18 ENCOUNTER — Other Ambulatory Visit: Payer: Self-pay | Admitting: Family Medicine

## 2022-12-18 ENCOUNTER — Other Ambulatory Visit (HOSPITAL_BASED_OUTPATIENT_CLINIC_OR_DEPARTMENT_OTHER): Payer: Self-pay

## 2022-12-18 MED ORDER — TRULICITY 3 MG/0.5ML ~~LOC~~ SOAJ
3.0000 mg | SUBCUTANEOUS | 1 refills | Status: DC
  Filled 2022-12-18: qty 2, 28d supply, fill #0
  Filled 2023-01-15: qty 2, 28d supply, fill #1

## 2022-12-19 ENCOUNTER — Other Ambulatory Visit (HOSPITAL_BASED_OUTPATIENT_CLINIC_OR_DEPARTMENT_OTHER): Payer: Self-pay

## 2022-12-24 ENCOUNTER — Inpatient Hospital Stay: Payer: Medicare Other

## 2022-12-24 ENCOUNTER — Encounter: Payer: Self-pay | Admitting: Family

## 2022-12-24 ENCOUNTER — Inpatient Hospital Stay (HOSPITAL_BASED_OUTPATIENT_CLINIC_OR_DEPARTMENT_OTHER): Payer: Medicare Other | Admitting: Family

## 2022-12-24 ENCOUNTER — Inpatient Hospital Stay: Payer: Medicare Other | Attending: Hematology & Oncology

## 2022-12-24 VITALS — BP 126/51 | HR 64 | Temp 98.0°F | Resp 18 | Wt 235.0 lb

## 2022-12-24 DIAGNOSIS — Z7901 Long term (current) use of anticoagulants: Secondary | ICD-10-CM | POA: Insufficient documentation

## 2022-12-24 DIAGNOSIS — D519 Vitamin B12 deficiency anemia, unspecified: Secondary | ICD-10-CM | POA: Diagnosis not present

## 2022-12-24 DIAGNOSIS — D509 Iron deficiency anemia, unspecified: Secondary | ICD-10-CM

## 2022-12-24 DIAGNOSIS — Z79899 Other long term (current) drug therapy: Secondary | ICD-10-CM | POA: Diagnosis not present

## 2022-12-24 DIAGNOSIS — D510 Vitamin B12 deficiency anemia due to intrinsic factor deficiency: Secondary | ICD-10-CM | POA: Insufficient documentation

## 2022-12-24 DIAGNOSIS — G629 Polyneuropathy, unspecified: Secondary | ICD-10-CM | POA: Diagnosis not present

## 2022-12-24 DIAGNOSIS — E611 Iron deficiency: Secondary | ICD-10-CM | POA: Insufficient documentation

## 2022-12-24 DIAGNOSIS — C8332 Diffuse large B-cell lymphoma, intrathoracic lymph nodes: Secondary | ICD-10-CM | POA: Insufficient documentation

## 2022-12-24 MED ORDER — CYANOCOBALAMIN 1000 MCG/ML IJ SOLN
1000.0000 ug | Freq: Once | INTRAMUSCULAR | Status: AC
  Administered 2022-12-24: 1000 ug via INTRAMUSCULAR
  Filled 2022-12-24: qty 1

## 2022-12-24 NOTE — Progress Notes (Signed)
Hematology and Oncology Follow Up Visit  Richard [name] [id number] [birth year] 82 y.o. 12/24/2022   Principle Diagnosis:  Diffuse large cell non-Hodgkin's lymphoma (IPI = 3) - NOT "double hit" Pernicious anemia Iron deficiency secondary to bleeding   Past Therapy: R-CHOP - s/p cycle 8 - completed 08/2017   Current Therapy:        Vitamin B12 1 mg IM every month Xgeva 120 mg subcu q 3 months - next dose due in 12/2022 IV iron as indicated   Interim History:  Richard [name] is here today for follow-up and B 12 injection. He is doing quite well and has no complaints at this time.  He has been playing golf 3 days a week and enjoying being with his buddies. They play early to avoid the heat and use a golf cart.  No falls or syncope reported.  He did bump into something and has an abrasion on the right knee. No other injury.  He states that the infrared treatment to his face with dermatology worked. He states that they plan to treat the dry thinning skin on his arms next.   He is scheduled to have his pacemaker placed on 04/01/2023 No fever, chills, n/v, cough, dizziness, SOB, chest pain, palpitations, abdominal pain or changes in bowel or bladder habits.  No swelling or tenderness in his extremities.  Neuropathy in the lower extremities unchanged.  Appetite and hydration are good. Weight is stable at 235 lbs.   ECOG Performance Status: 1 - Symptomatic but completely ambulatory  Medications:  Allergies as of 12/24/2022       Reactions   Ace Inhibitors Swelling, Other (See Comments)   Angioedema   Benazepril Swelling, Other (See Comments)   Angioedema; he is not a candidate for any angiotensin receptor blockers because of this significant allergic reaction. Because of a history of documented adverse serious drug reaction;Medi Alert bracelet  is recommended   Entresto [sacubitril-valsartan] Swelling, Other (See Comments)   First-in-Class Angiotensin Receptor Neprilysin Inhibitor- Med was  "red-flagged" by the patient's pharmacy for him to NOT take!   Hctz [hydrochlorothiazide] Anaphylaxis, Swelling   Tongue and lip swelling   Aspirin Other (See Comments)   Gastritis, can aspirin not take 325 mg aspirin        Medication List        Accurate as of December 24, 2022  1:43 PM. If you have any questions, ask your nurse or doctor.          acetaminophen 325 MG tablet Commonly known as: TYLENOL Take 650 mg by mouth at bedtime as needed for moderate pain or headache.   allopurinol 300 MG tablet Commonly known as: ZYLOPRIM Take 450 mg by mouth daily.   aluminum hydroxide-magnesium carbonate 95-358 MG/15ML Susp Commonly known as: GAVISCON Take 15 mLs by mouth as needed for indigestion or heartburn.   atorvastatin 80 MG tablet Commonly known as: LIPITOR TAKE 1 TABLET BY MOUTH EVERYDAY AT BEDTIME   Azelastine HCl 137 MCG/SPRAY Soln PLACE 2 SPRAYS INTO BOTH NOSTRILS AT BEDTIME AS NEEDED FOR RHINITIS OR ALLERGIES.   CO Q-10 PO Take 1 capsule by mouth daily.   Eliquis 2.5 MG Tabs tablet Generic drug: apixaban TAKE 1 TABLET BY MOUTH TWICE A DAY   esomeprazole 40 MG capsule Commonly known as: NexIUM Take 1 capsule (40 mg total) by mouth daily. What changed: when to take this   ezetimibe 10 MG tablet Commonly known as: ZETIA Take 1 tablet (10 mg total) by mouth  daily.   Farxiga 10 MG Tabs tablet Generic drug: dapagliflozin propanediol Take 10 mg by mouth in the morning.   fenofibrate 160 MG tablet TAKE 1 TABLET BY MOUTH EVERY DAY   folic acid 1 MG tablet Commonly known as: FOLVITE TAKE 2 TABLETS BY MOUTH EVERY DAY   freestyle lancets USE ONCE A DAY TO CHECK BLOOD SUGAR.   FREESTYLE LITE test strip Generic drug: glucose blood USE TO TEST BLOOD SUGAR ONCE A DAY. DX CODE: E11.9   glimepiride 2 MG tablet Commonly known as: AMARYL TAKE 1 TABLET (2 MG TOTAL) BY MOUTH IN THE MORNING AND AT BEDTIME.   ICY HOT ADVANCED PAIN RELIEF EX Apply 1 application   topically daily as needed (to painful sites).   Klor-Con M20 20 MEQ tablet Generic drug: potassium chloride SA TAKE 2 TABLETS BY MOUTH DAILY   metoprolol succinate 25 MG 24 hr tablet Commonly known as: TOPROL-XL Take 1 tablet (25 mg total) by mouth daily.   nitroGLYCERIN 0.4 MG SL tablet Commonly known as: NITROSTAT PLACE 1 TABLET UNDER THE TONGUE EVERY 5 MINUTES AS NEEDED FOR CHEST PAIN.   pregabalin 150 MG capsule Commonly known as: LYRICA Take 150 mg by mouth at bedtime. (Take 100mg  daily at noon) What changed: Another medication with the same name was changed. Make sure you understand how and when to take each.   pregabalin 100 MG capsule Commonly known as: Lyrica Take 1 capsule (100 mg total) by mouth 2 (two) times daily. What changed:  when to take this additional instructions   psyllium 58.6 % powder Commonly known as: METAMUCIL Take 1 packet by mouth daily as needed (for constipation- mix and drink).   senna 8.6 MG Tabs tablet Commonly known as: SENOKOT Take 2 tablets (17.2 mg total) by mouth at bedtime. This is for constipation.  Stop taking if you start having diarrhea. What changed:  when to take this reasons to take this   torsemide 20 MG tablet Commonly known as: DEMADEX TAKE 2 TABLETS (40 MG TOTAL) BY MOUTH IN THE MORNING. DO NOT TAKE ON 01/24/2022.   Trulicity 3 MG/0.5ML Sopn Generic drug: Dulaglutide Inject 3 mg into the skin once a week.   Vascepa 1 g capsule Generic drug: icosapent Ethyl TAKE 2 CAPSULES BY MOUTH 2 TIMES DAILY.   VITAMIN C PO Take 1 tablet by mouth daily with breakfast.        Allergies:  Allergies  Allergen Reactions   Ace Inhibitors Swelling and Other (See Comments)    Angioedema    Benazepril Swelling and Other (See Comments)    Angioedema; he is not a candidate for any angiotensin receptor blockers because of this significant allergic reaction. Because of a history of documented adverse serious drug reaction;Medi  Alert bracelet  is recommended   Entresto [Sacubitril-Valsartan] Swelling and Other (See Comments)    First-in-Class Angiotensin Receptor Neprilysin Inhibitor- Med was "red-flagged" by the patient's pharmacy for him to NOT take!   Hctz [Hydrochlorothiazide] Anaphylaxis and Swelling    Tongue and lip swelling    Aspirin Other (See Comments)    Gastritis, can aspirin not take 325 mg aspirin    Past Medical History, Surgical history, Social history, and Family History were reviewed and updated.  Review of Systems: All other 10 point review of systems is negative.   Physical Exam:  vitals were not taken for this visit.   Wt Readings from Last 3 Encounters:  11/26/22 235 lb 1.3 oz (106.6 kg)  11/26/22  235 lb 9.6 oz (106.9 kg)  10/24/22 233 lb (105.7 kg)    Ocular: Sclerae unicteric, pupils equal, round and reactive to light Ear-nose-throat: Oropharynx clear, dentition fair Lymphatic: No cervical or supraclavicular adenopathy Lungs no rales or rhonchi, good excursion bilaterally Heart regular rate and rhythm, no murmur appreciated Abd soft, nontender, positive bowel sounds MSK no focal spinal tenderness, no joint edema Neuro: non-focal, well-oriented, appropriate affect Breasts: Deferred   Lab Results  Component Value Date   WBC 8.3 11/26/2022   HGB 14.3 11/26/2022   HCT 43.8 11/26/2022   MCV 89.8 11/26/2022   PLT 190 11/26/2022   Lab Results  Component Value Date   FERRITIN 1,575 (H) 11/26/2022   IRON 82 11/26/2022   TIBC 388 11/26/2022   UIBC 306 11/26/2022   IRONPCTSAT 21 11/26/2022   Lab Results  Component Value Date   RETICCTPCT 2.0 10/25/2022   RBC 4.88 11/26/2022   RETICCTABS 52.1 11/22/2011   No results found for: "KPAFRELGTCHN", "LAMBDASER", "[facility]" Lab Results  Component Value Date   IGA 159 03/09/2012   Lab Results  Component Value Date   ALBUMINELP 4.2 07/27/2018   MSPIKE Not Observed 07/27/2018     Chemistry      Component Value  Date/Time   NA 140 11/26/2022 1227   NA 138 02/08/2022 0811   NA 144 06/27/2017 0857   K 4.0 11/26/2022 1227   K 3.9 06/27/2017 0857   CL 99 11/26/2022 1227   CL 103 06/27/2017 0857   CO2 26 11/26/2022 1227   CO2 26 06/27/2017 0857   BUN 41 (H) 11/26/2022 1227   BUN 46 (H) 02/08/2022 0811   BUN 12 06/27/2017 0857   CREATININE 1.99 (H) 11/26/2022 1227   CREATININE 1.80 (H) 11/26/2022 1006   CREATININE 1.65 (H) 04/18/2020 0956      Component Value Date/Time   CALCIUM 10.1 11/26/2022 1227   CALCIUM 9.2 06/27/2017 0857   ALKPHOS 58 11/26/2022 1227   ALKPHOS 128 (H) 06/27/2017 0857   AST 18 11/26/2022 1227   AST 20 11/26/2022 1006   ALT 15 11/26/2022 1227   ALT 18 11/26/2022 1006   ALT 24 06/27/2017 0857   BILITOT 0.8 11/26/2022 1227   BILITOT 0.8 11/26/2022 1006       Impression and Plan: Richard [name] is a very pleasant 82 yo caucasian gentleman with diffuse large B-cell lymphoma (not "double hit" lymphoma). He completed treatment in February 2019.  Iron studies pending.  B 12 given today.  Rivka Barbara due again in July.  Follow-up in 1 month.    Eileen Stanford, NP  6/25/20241:43 PM

## 2022-12-24 NOTE — Patient Instructions (Signed)

## 2022-12-25 LAB — CBC WITH DIFFERENTIAL (CANCER CENTER ONLY)
Abs Immature Granulocytes: 0.05 10*3/uL (ref 0.00–0.07)
Basophils Absolute: 0.1 10*3/uL (ref 0.0–0.1)
Basophils Relative: 1 %
Eosinophils Absolute: 0.3 10*3/uL (ref 0.0–0.5)
Eosinophils Relative: 4 %
HCT: 44.4 % (ref 39.0–52.0)
Hemoglobin: 14.3 g/dL (ref 13.0–17.0)
Immature Granulocytes: 1 %
Lymphocytes Relative: 21 %
Lymphs Abs: 1.7 10*3/uL (ref 0.7–4.0)
MCH: 29.8 pg (ref 26.0–34.0)
MCHC: 32.2 g/dL (ref 30.0–36.0)
MCV: 92.5 fL (ref 80.0–100.0)
Monocytes Absolute: 0.7 10*3/uL (ref 0.1–1.0)
Monocytes Relative: 8 %
Neutro Abs: 5.4 10*3/uL (ref 1.7–7.7)
Neutrophils Relative %: 65 %
Platelet Count: 215 10*3/uL (ref 150–400)
RBC: 4.8 MIL/uL (ref 4.22–5.81)
RDW: 16.3 % — ABNORMAL HIGH (ref 11.5–15.5)
WBC Count: 8.1 10*3/uL (ref 4.0–10.5)
nRBC: 0 % (ref 0.0–0.2)

## 2022-12-25 LAB — CMP (CANCER CENTER ONLY)
ALT: 16 U/L (ref 0–44)
AST: 22 U/L (ref 15–41)
Albumin: 4.6 g/dL (ref 3.5–5.0)
Alkaline Phosphatase: 65 U/L (ref 38–126)
Anion gap: 12 (ref 5–15)
BUN: 58 mg/dL — ABNORMAL HIGH (ref 8–23)
CO2: 24 mmol/L (ref 22–32)
Calcium: 10.3 mg/dL (ref 8.9–10.3)
Chloride: 103 mmol/L (ref 98–111)
Creatinine: 2.5 mg/dL — ABNORMAL HIGH (ref 0.61–1.24)
GFR, Estimated: 25 mL/min — ABNORMAL LOW (ref >60)
Glucose, Bld: 200 mg/dL — ABNORMAL HIGH (ref 70–99)
Potassium: 4.3 mmol/L (ref 3.5–5.1)
Sodium: 139 mmol/L (ref 135–145)
Total Bilirubin: 0.7 mg/dL (ref 0.3–1.2)
Total Protein: 6.9 g/dL (ref 6.5–8.1)

## 2022-12-25 LAB — RETICULOCYTES
Immature Retic Fract: 26.2 % — ABNORMAL HIGH (ref 2.3–15.9)
RBC.: 4.8 MIL/uL (ref 4.22–5.81)
Retic Count, Absolute: 95 10*3/uL (ref 19.0–186.0)
Retic Ct Pct: 2 % (ref 0.4–3.1)

## 2022-12-25 LAB — IRON AND IRON BINDING CAPACITY (CC-WL,HP ONLY)
Iron: 102 ug/dL (ref 45–182)
Saturation Ratios: 28 % (ref 17.9–39.5)
TIBC: 371 ug/dL (ref 250–450)
UIBC: 269 ug/dL (ref 117–376)

## 2022-12-25 LAB — FERRITIN: Ferritin: 1814 ng/mL — ABNORMAL HIGH (ref 24–336)

## 2022-12-25 LAB — LACTATE DEHYDROGENASE: LDH: 169 U/L (ref 98–192)

## 2022-12-26 ENCOUNTER — Telehealth: Payer: Self-pay | Admitting: Family Medicine

## 2022-12-26 NOTE — Telephone Encounter (Signed)
[facility] called stating pt's pregabilin Rx was not correct and needed it corrected. Rep would like a call when possible.

## 2022-12-26 NOTE — Telephone Encounter (Signed)
Please call patient to discuss a prescription #[phone]

## 2022-12-27 ENCOUNTER — Other Ambulatory Visit: Payer: Self-pay | Admitting: Family Medicine

## 2022-12-27 MED ORDER — PREGABALIN 100 MG PO CAPS: 100.0000 mg | ORAL_CAPSULE | Freq: Every day | ORAL | 1 refills | Status: DC

## 2022-12-27 MED ORDER — PREGABALIN 150 MG PO CAPS: 150.0000 mg | ORAL_CAPSULE | Freq: Every day | ORAL | 1 refills | Status: DC

## 2022-12-27 NOTE — Telephone Encounter (Signed)
Verified with [facility] that pregablin / Lyrica 150mg  was filled and ready for pick up. Patient notified.

## 2022-12-27 NOTE — Telephone Encounter (Signed)
Richard [name] returned call. See phone note

## 2022-12-27 NOTE — Telephone Encounter (Signed)
Patient has a been taking Lyrica 100mg  at noon and 150mg  at bedtime. The dose was lowered at noon due to some daytime drowsiness. He tried to get Lyrica 150mg  filled yesterday at [facility] but was told that Rx was discontinued.  Will forward to PCP to send updated Rx.   If insurance will not allow him to have prescriptions for both - could change to Lyrica 50mg  - take 2 tablets at noon and 3 tablets at bedtime.

## 2023-01-01 ENCOUNTER — Encounter: Payer: Self-pay | Admitting: Podiatry

## 2023-01-01 ENCOUNTER — Ambulatory Visit (INDEPENDENT_AMBULATORY_CARE_PROVIDER_SITE_OTHER): Payer: Medicare Other | Admitting: Podiatry

## 2023-01-01 DIAGNOSIS — B351 Tinea unguium: Secondary | ICD-10-CM | POA: Diagnosis not present

## 2023-01-01 DIAGNOSIS — E1165 Type 2 diabetes mellitus with hyperglycemia: Secondary | ICD-10-CM

## 2023-01-01 DIAGNOSIS — M79674 Pain in right toe(s): Secondary | ICD-10-CM

## 2023-01-01 DIAGNOSIS — M79675 Pain in left toe(s): Secondary | ICD-10-CM | POA: Diagnosis not present

## 2023-01-01 DIAGNOSIS — Q828 Other specified congenital malformations of skin: Secondary | ICD-10-CM | POA: Diagnosis not present

## 2023-01-01 NOTE — Progress Notes (Signed)
This patient returns to my office for at risk foot care.  This patient requires this care by a professional since this patient will be at risk due to having diabetic neuropathy  and coagulation defect.  Patient is taking eliquis. This patient is unable to cut nails himself since the patient cannot reach his nails.These nails are painful walking and wearing shoes.  Patient says the callus on his left forefoot has become painful when walking. He says there has been drainage for over 4 weeks. This patient presents for at risk foot care today.  General Appearance  Alert, conversant and in no acute stress.  Vascular  Dorsalis pedis and posterior tibial  pulses are  weakly palpable  bilaterally.  Capillary return is within normal limits  bilaterally. Temperature is within normal limits  bilaterally.  Neurologic  Senn-Weinstein monofilament wire test within normal limits  Left foot.  LOPS right foot is absent.  . Muscle power within normal limits bilaterally.  Nails Thick disfigured discolored nails with subungual debris  from hallux to fifth toes bilaterally. No evidence of bacterial infection or drainage bilaterally.  Orthopedic  No limitations of motion  feet .  No crepitus or effusions noted.  No bony pathology or digital deformities noted.  Plantar flexed fifth metatarsal B/L.  Skin  normotropic skin with noted bilaterally.  No signs of infections or ulcers noted  Porokeratosis sub 5th right foot.  Hemorrhagic callus noted sub 5th left foot.  No evidence of drainage today.  Onychomycosis  Pain in right toes  Pain in left toes  Porokeratosis sub 5th left   Consent was obtained for treatment procedures.   Mechanical debridement of nails 1-5  bilaterally performed with a nail nipper.  Filed with dremel without incident. Debridement of callus sub 5th left foot. Padding added to hs shoe  and patient given padding for his golf shoes.  He is not interested in padding added to wooden shoe.   Return office  visit   12 weeks.                  Told patient to return for periodic foot care and evaluation due to potential at risk complications.   Helane Gunther DPM

## 2023-01-13 ENCOUNTER — Ambulatory Visit (INDEPENDENT_AMBULATORY_CARE_PROVIDER_SITE_OTHER): Payer: Medicare Other | Admitting: Pharmacist

## 2023-01-13 DIAGNOSIS — I5042 Chronic combined systolic (congestive) and diastolic (congestive) heart failure: Secondary | ICD-10-CM

## 2023-01-13 DIAGNOSIS — E785 Hyperlipidemia, unspecified: Secondary | ICD-10-CM

## 2023-01-13 DIAGNOSIS — N183 Chronic kidney disease, stage 3 unspecified: Secondary | ICD-10-CM

## 2023-01-13 DIAGNOSIS — E1122 Type 2 diabetes mellitus with diabetic chronic kidney disease: Secondary | ICD-10-CM

## 2023-01-13 DIAGNOSIS — I129 Hypertensive chronic kidney disease with stage 1 through stage 4 chronic kidney disease, or unspecified chronic kidney disease: Secondary | ICD-10-CM

## 2023-01-13 DIAGNOSIS — E1169 Type 2 diabetes mellitus with other specified complication: Secondary | ICD-10-CM

## 2023-01-13 NOTE — Progress Notes (Signed)
Pharmacy Note  01/13/2023 Name: Richard [name] MRN: [id number] DOB: [birth year]  Subjective: Richard [name] is a 82 y.o. year old male who is a primary care patient of Zola Button, Grayling Congress, DO. Clinical Pharmacist Practitioner referral was placed to assist with medication and diabetes management.    Engaged with patient by telephone for follow up visit today.  Type 2 DM -  Current therapy - Trulicity 3mg  weekly, glimepiride 2mg  - take 2 tablets = 4mg  daily and Farxiga 10mg  daily.  Past therapies: metformin stopped due to rising Scr.  Initially recommended Ozempic instead of Trulicity but Trulicity was preferred by patient's insurance.   Tolerating Trulicity well.  Current home blood glucose readings 135 to 160 but he reports he did have a low a few weeks ago - blood glucose was 76. He had breakfast at 5am and then played golf. When he returned home at 2pm blood glucose had dropped. He drank regular Gatorade and blood glucose increased to over 100.    Wt Readings from Last 3 Encounters:  12/24/22 235 lb (106.6 kg)  11/26/22 235 lb 1.3 oz (106.6 kg)  11/26/22 235 lb 9.6 oz (106.9 kg)   Weight 1 year ago 10/26/2021 was 251lbs.  (Has lost about 16 lbs since starting GLP1  Diet - has been able to control appetite better since starting Trulicity. He is limiting sweets and sweetened beverages  Exercise - golfs 9 to 18 holes 2 or 3 times per week. States he feels great.   CHF:  Patient is weighing daily. He will take an extra torsemide if needed for weight gain of more than 2 - 3 lbs in 24 hours or 5lbs in a week.  Current medications: torsemide 10mg  daily, Farxiga 10mg  daily (for DM, CHF and CKD) Patient reports he will have surgery to replace his pacemaker in October 2024.  Medication management:  Enrolled in Hyperlipidemia and CHF / Cardiomyopathy [employer] funds which covers all his cholesterol medications and heart medications.    Objective: Review of patient status,  including review of consultants reports, laboratory and other test data, was performed as part of comprehensive.  Lab Results  Component Value Date   CREATININE 2.50 (H) 12/24/2022   CREATININE 1.99 (H) 11/26/2022   CREATININE 1.80 (H) 11/26/2022    Lab Results  Component Value Date   HGBA1C 7.1 (H) 11/26/2022       Component Value Date/Time   CHOL 154 11/26/2022 1006   CHOL 162 11/23/2020 0828   TRIG 387.0 (H) 11/26/2022 1006   HDL 31.40 (L) 11/26/2022 1006   HDL 38 (L) 11/23/2020 0828   CHOLHDL 5 11/26/2022 1006   VLDL 77.4 (H) 11/26/2022 1006   LDLCALC 76 01/25/2022 1218   LDLCALC 89 11/23/2020 0828   LDLCALC  04/18/2020 0956     Comment:     . LDL cholesterol not calculated. Triglyceride levels greater than 400 mg/dL invalidate calculated LDL results. . Reference range: <100 . Desirable range <100 mg/dL for primary prevention;   <70 mg/dL for patients with CHD or diabetic patients  with > or = 2 CHD risk factors. [name] LDL-C is now calculated using the Martin-Hopkins  calculation, which is a validated novel method providing  better accuracy than the Friedewald equation in the  estimation of LDL-C.  Horald Pollen et al. Lenox Ahr. 4098;119(14): 2061-2068  (http://education.[facility].com/faq/FAQ164)    LDLDIRECT 78.0 11/26/2022 1006     Clinical ASCVD: Yes  The ASCVD Risk score (Arnett DK, et al.,  2019) failed to calculate for the following reasons:   The 2019 ASCVD risk score is only valid for ages 44 to 89   The patient has a prior MI or stroke diagnosis    BP Readings from Last 3 Encounters:  12/24/22 (!) 126/51  11/26/22 131/60  11/26/22 100/80     Allergies  Allergen Reactions   Ace Inhibitors Swelling and Other (See Comments)    Angioedema    Benazepril Swelling and Other (See Comments)    Angioedema; he is not a candidate for any angiotensin receptor blockers because of this significant allergic reaction. Because of a history of documented adverse  serious drug reaction;Medi Alert bracelet  is recommended   Entresto [Sacubitril-Valsartan] Swelling and Other (See Comments)    First-in-Class Angiotensin Receptor Neprilysin Inhibitor- Med was "red-flagged" by the patient's pharmacy for him to NOT take!   Hctz [Hydrochlorothiazide] Anaphylaxis and Swelling    Tongue and lip swelling    Aspirin Other (See Comments)    Gastritis, can aspirin not take 325 mg aspirin    Medications Reviewed Today     Reviewed by Henrene Pastor, RPH-CPP (Pharmacist) on 01/13/23 at 1457  Med List Status: <None>   Medication Order Taking? Sig Documenting Provider Last Dose Status Informant  acetaminophen (TYLENOL) 325 MG tablet [id number] Yes Take 650 mg by mouth at bedtime as needed for moderate pain or headache. [provider] Taking Active Self  allopurinol (ZYLOPRIM) 300 MG tablet [id number] Yes Take 450 mg by mouth daily. [provider] Taking Active Self           Med Note Antony Madura, Arn Medal   Wed Jan 16, 2022  2:57 PM) 1.5 tablets = 450 mg  aluminum hydroxide-magnesium carbonate (GAVISCON) 95-358 MG/15ML SUSP [id number] Yes Take 15 mLs by mouth as needed for indigestion or heartburn. [provider] Taking Active Self  apixaban (ELIQUIS) 2.5 MG TABS tablet [id number] Yes TAKE 1 TABLET BY MOUTH TWICE A Kenn File, MD Taking Active   Ascorbic Acid (VITAMIN C PO) [id number] Yes Take 1 tablet by mouth daily with breakfast. [provider] Taking Active Self  atorvastatin (LIPITOR) 80 MG tablet [id number] Yes TAKE 1 TABLET BY MOUTH EVERYDAY AT BEDTIME Rollene Rotunda, MD Taking Active   Azelastine HCl 137 MCG/SPRAY SOLN [id number] Yes PLACE 2 SPRAYS INTO BOTH NOSTRILS AT BEDTIME AS NEEDED FOR RHINITIS OR ALLERGIES. Seabron Spates R, DO Taking Active   Coenzyme Q10 (CO Q-10 PO) [id number] Yes Take 1 capsule by mouth daily. [provider] Taking Active Self  Dulaglutide (TRULICITY) 3 MG/0.5ML SOPN [id number] Yes  Inject 3 mg into the skin once a week. Donato Schultz, DO Taking Active   esomeprazole (NEXIUM) 40 MG capsule [id number] Yes Take 1 capsule (40 mg total) by mouth daily.  Patient taking differently: Take 40 mg by mouth daily before breakfast.   Pecola Lawless, MD Taking Active Self  ezetimibe (ZETIA) 10 MG tablet [id number] Yes Take 1 tablet (10 mg total) by mouth daily. Seabron Spates R, DO Taking Active   FARXIGA 10 MG TABS tablet [id number] Yes Take 10 mg by mouth in the morning. [provider] Taking Active Self  fenofibrate 160 MG tablet [id number] Yes TAKE 1 TABLET BY MOUTH EVERY DAY Zola Button, Grayling Congress, DO Taking Active   folic acid (FOLVITE) 1 MG tablet [id number] Yes TAKE 2 TABLETS BY MOUTH EVERY DAY Ennever, Rose Phi, MD Taking Active   glimepiride (AMARYL) 2 MG tablet  [id number] Yes TAKE 1 TABLET (2 MG TOTAL) BY MOUTH IN THE MORNING AND AT BEDTIME. Seabron Spates R, DO Taking Active   glucose blood (FREESTYLE LITE) test strip [id number] Yes USE TO TEST BLOOD SUGAR ONCE A DAY. DX CODE: E11.9 Donato Schultz, DO Taking Active   icosapent Ethyl (VASCEPA) 1 g capsule [id number] Yes TAKE 2 CAPSULES BY MOUTH 2 TIMES DAILY. Zola Button, Grayling Congress, DO Taking Active   KLOR-CON M20 20 MEQ tablet [id number] Yes TAKE 2 TABLETS BY MOUTH DAILY Rollene Rotunda, MD Taking Active   Lancets (FREESTYLE) lancets [id number] Yes USE ONCE A DAY TO CHECK BLOOD SUGAR. Donato Schultz, DO Taking Active   Menthol-Camphor (ICY HOT ADVANCED PAIN RELIEF EX) [id number] Yes Apply 1 application  topically daily as needed (to painful sites). [provider] Taking Active Self  metoprolol succinate (TOPROL-XL) 25 MG 24 hr tablet [id number] Yes Take 1 tablet (25 mg total) by mouth daily. Rollene Rotunda, MD Taking Active Self  nitroGLYCERIN (NITROSTAT) 0.4 MG SL tablet [id number] Yes PLACE 1 TABLET UNDER THE TONGUE EVERY 5 MINUTES AS NEEDED FOR CHEST PAIN. Rollene Rotunda, MD Taking Active    pregabalin (LYRICA) 100 MG capsule [id number] Yes Take 1 capsule (100 mg total) by mouth daily. At noon [facility], Grayling Congress, DO Taking Active   pregabalin (LYRICA) 150 MG capsule [id number] Yes Take 1 capsule (150 mg total) by mouth at bedtime. (Take 100mg  daily at noon) Zola Button, Grayling Congress, DO Taking Active   psyllium (METAMUCIL) 58.6 % powder [id number] Yes Take 1 packet by mouth daily as needed (for constipation- mix and drink). [provider] Taking Active Self  senna (SENOKOT) 8.6 MG TABS tablet [id number] Yes Take 2 tablets (17.2 mg total) by mouth at bedtime. This is for constipation.  Stop taking if you start having diarrhea.  Patient taking differently: Take 2 tablets by mouth daily as needed for mild constipation. This is for constipation.  Stop taking if you start having diarrhea.   Elease Etienne, MD Taking Active Self  torsemide (DEMADEX) 20 MG tablet [id number] Yes TAKE 2 TABLETS (40 MG TOTAL) BY MOUTH IN THE MORNING. DO NOT TAKE ON 01/24/2022. Marcelino Duster, PA Taking Active   Med List Note Mathews Robinsons, CPhT 06/10/13 2993): cpap machine 2 L of oxygen at night            Patient Active Problem List   Diagnosis Date Noted   Permanent atrial fibrillation (HCC) 10/23/2022   Simple chronic bronchitis (HCC) 06/03/2022   Cellulitis of lower extremity 04/30/2022   Type 2 DM with CKD stage 3 and hypertension (HCC) 04/30/2022   Cellulitis of right leg 04/24/2022   Stage 3b chronic kidney disease (CKD) (HCC) 04/24/2022   Lactic acidosis 04/24/2022   Left upper quadrant pain 04/08/2022   Subacute cough 04/08/2022   Chronic combined systolic and diastolic heart failure (HCC) 02/06/2022   Acute exacerbation of CHF (congestive heart failure) (HCC) 01/16/2022   Hemoptysis 12/26/2021   Subclavian vein stenosis 12/23/2021   Strep throat 11/16/2021   Multiple superficial wounds with infection 01/04/2021   Cardiac pacemaker in situ 11/20/2020   S/P CABG x 4  11/20/2020   Ascending aortic aneurysm (HCC) 11/20/2020   Senile purpura (HCC) 07/27/2020   Porokeratosis 06/20/2020   Pain due to onychomycosis of toenails of both feet 12/14/2019   Educated about COVID-19 virus infection 11/25/2019   DM2 (diabetes mellitus, type 2) (HCC) 08/10/2019   Hyperlipidemia associated  with type 2 diabetes mellitus (HCC) 08/10/2019   S/P left TKA 06/29/2019   Peripheral neuropathy 07/27/2018   Uncontrolled type 2 diabetes mellitus with hyperglycemia (HCC) 05/13/2018   Diabetic peripheral neuropathy associated with type 2 diabetes mellitus (HCC) 05/13/2018   Pansinusitis 05/13/2018   Severe aortic stenosis    Complete heart block (HCC)    Paroxysmal atrial fibrillation (HCC)    Acute on chronic diastolic CHF (congestive heart failure), NYHA class 2 (HCC)    Left arm swelling    Status post transcatheter aortic valve replacement (TAVR) using bioprosthesis 09/09/2017   Demand ischemia of myocardium    Typical atrial flutter (HCC)    Acute on chronic diastolic heart failure (HCC)    GIB (gastrointestinal bleeding) 08/16/2017   Hyperlipidemia 08/05/2017   NSTEMI (non-ST elevated myocardial infarction) (HCC)    Aortic stenosis, severe 07/25/2017   Pancytopenia (HCC) 07/24/2017   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) 02/27/2017   Bradycardia 01/04/2017   Coronary artery disease 01/04/2017   Stenosis of carotid artery 01/04/2017   Obesity 09/18/2016   Wenckebach block    Disorder associated with type II diabetes mellitus (HCC) 05/21/2013   Spinal stenosis of lumbar region 04/08/2013   Anemia, iron deficiency 11/29/2011   B12 deficiency anemia 07/30/2011   OSA (obstructive sleep apnea) 07/06/2010   CAD, ARTERY BYPASS GRAFT 08/22/2009   Essential hypertension 03/29/2009   Bilateral lower extremity edema 02/21/2009   Hyperlipidemia LDL goal <70 11/26/2008   GOUT 08/17/2006     Medication Assistance:   High med cost when in  coverage gap. Screened for medication assistance program in past - patient did not qualify. [employer] for hypercholesterolemia. Approved for $2500 thru 06/03/2023  Patient reports he has gotten out of Medicare coverage gap and is no in catastrophic coverage phase. Medications are $0 to $10.   Assessment / Plan: Type 2 DM - A1c not 7.1% Continue Trulicity 3mg  weekly, glimepiride 2mg  twice a day and Farxiga 10mg  daily.  Continue to check blood glucose once a day Reviewed s/s of hypoglycemia and how to treat.    CHF:  Continue to check weight daily and take extra torsemide as needed.  Continue to take [place].   Medication management:  Reviewed and updated medication list  Reviewed refill history and adherence .  Follow Up:  Telephone follow up appointment with care management team member scheduled for:  1 to 2 months   Henrene Pastor, PharmD Clinical Pharmacist [facility]  - [facility] [phone]

## 2023-01-16 ENCOUNTER — Other Ambulatory Visit (HOSPITAL_BASED_OUTPATIENT_CLINIC_OR_DEPARTMENT_OTHER): Payer: Self-pay

## 2023-01-23 ENCOUNTER — Inpatient Hospital Stay: Payer: Medicare Other

## 2023-01-23 ENCOUNTER — Inpatient Hospital Stay: Payer: Medicare Other | Admitting: Medical Oncology

## 2023-01-30 ENCOUNTER — Inpatient Hospital Stay: Payer: Medicare Other

## 2023-01-30 ENCOUNTER — Inpatient Hospital Stay: Payer: Medicare Other | Attending: Hematology & Oncology

## 2023-01-30 ENCOUNTER — Other Ambulatory Visit: Payer: Self-pay | Admitting: Cardiology

## 2023-01-30 ENCOUNTER — Inpatient Hospital Stay (HOSPITAL_BASED_OUTPATIENT_CLINIC_OR_DEPARTMENT_OTHER): Payer: Medicare Other | Admitting: Medical Oncology

## 2023-01-30 VITALS — BP 122/60 | HR 81 | Temp 97.8°F | Resp 17 | Ht 72.0 in | Wt 239.0 lb

## 2023-01-30 DIAGNOSIS — D510 Vitamin B12 deficiency anemia due to intrinsic factor deficiency: Secondary | ICD-10-CM

## 2023-01-30 DIAGNOSIS — G629 Polyneuropathy, unspecified: Secondary | ICD-10-CM | POA: Insufficient documentation

## 2023-01-30 DIAGNOSIS — D508 Other iron deficiency anemias: Secondary | ICD-10-CM

## 2023-01-30 DIAGNOSIS — C8332 Diffuse large B-cell lymphoma, intrathoracic lymph nodes: Secondary | ICD-10-CM

## 2023-01-30 DIAGNOSIS — Z7901 Long term (current) use of anticoagulants: Secondary | ICD-10-CM | POA: Diagnosis not present

## 2023-01-30 DIAGNOSIS — Z79899 Other long term (current) drug therapy: Secondary | ICD-10-CM | POA: Insufficient documentation

## 2023-01-30 DIAGNOSIS — D519 Vitamin B12 deficiency anemia, unspecified: Secondary | ICD-10-CM

## 2023-01-30 DIAGNOSIS — D509 Iron deficiency anemia, unspecified: Secondary | ICD-10-CM

## 2023-01-30 DIAGNOSIS — E611 Iron deficiency: Secondary | ICD-10-CM | POA: Diagnosis not present

## 2023-01-30 LAB — RETICULOCYTES
Immature Retic Fract: 25.1 % — ABNORMAL HIGH (ref 2.3–15.9)
RBC.: 4.76 MIL/uL (ref 4.22–5.81)
Retic Count, Absolute: 83.8 10*3/uL (ref 19.0–186.0)
Retic Ct Pct: 1.8 % (ref 0.4–3.1)

## 2023-01-30 LAB — CMP (CANCER CENTER ONLY)
ALT: 19 U/L (ref 0–44)
AST: 20 U/L (ref 15–41)
Albumin: 4.4 g/dL (ref 3.5–5.0)
Alkaline Phosphatase: 77 U/L (ref 38–126)
Anion gap: 11 (ref 5–15)
BUN: 35 mg/dL — ABNORMAL HIGH (ref 8–23)
CO2: 29 mmol/L (ref 22–32)
Calcium: 9.4 mg/dL (ref 8.9–10.3)
Chloride: 98 mmol/L (ref 98–111)
Creatinine: 2.01 mg/dL — ABNORMAL HIGH (ref 0.61–1.24)
GFR, Estimated: 32 mL/min — ABNORMAL LOW (ref >60)
Glucose, Bld: 292 mg/dL — ABNORMAL HIGH (ref 70–99)
Potassium: 4.2 mmol/L (ref 3.5–5.1)
Sodium: 138 mmol/L (ref 135–145)
Total Bilirubin: 0.6 mg/dL (ref 0.3–1.2)
Total Protein: 7.1 g/dL (ref 6.5–8.1)

## 2023-01-30 LAB — CBC WITH DIFFERENTIAL (CANCER CENTER ONLY)
Abs Immature Granulocytes: 0.06 10*3/uL (ref 0.00–0.07)
Basophils Absolute: 0.1 10*3/uL (ref 0.0–0.1)
Basophils Relative: 1 %
Eosinophils Absolute: 0.1 10*3/uL (ref 0.0–0.5)
Eosinophils Relative: 2 %
HCT: 44.4 % (ref 39.0–52.0)
Hemoglobin: 14.2 g/dL (ref 13.0–17.0)
Immature Granulocytes: 1 %
Lymphocytes Relative: 23 %
Lymphs Abs: 1.5 10*3/uL (ref 0.7–4.0)
MCH: 29.6 pg (ref 26.0–34.0)
MCHC: 32 g/dL (ref 30.0–36.0)
MCV: 92.5 fL (ref 80.0–100.0)
Monocytes Absolute: 0.5 10*3/uL (ref 0.1–1.0)
Monocytes Relative: 7 %
Neutro Abs: 4.4 10*3/uL (ref 1.7–7.7)
Neutrophils Relative %: 66 %
Platelet Count: 167 10*3/uL (ref 150–400)
RBC: 4.8 MIL/uL (ref 4.22–5.81)
RDW: 16.1 % — ABNORMAL HIGH (ref 11.5–15.5)
WBC Count: 6.5 10*3/uL (ref 4.0–10.5)
nRBC: 0 % (ref 0.0–0.2)

## 2023-01-30 LAB — IRON AND IRON BINDING CAPACITY (CC-WL,HP ONLY)
Iron: 93 ug/dL (ref 45–182)
Saturation Ratios: 28 % (ref 17.9–39.5)
TIBC: 333 ug/dL (ref 250–450)
UIBC: 240 ug/dL (ref 117–376)

## 2023-01-30 LAB — FERRITIN: Ferritin: 1587 ng/mL — ABNORMAL HIGH (ref 24–336)

## 2023-01-30 LAB — LACTATE DEHYDROGENASE: LDH: 147 U/L (ref 98–192)

## 2023-01-30 LAB — VITAMIN B12: Vitamin B-12: 248 pg/mL (ref 180–914)

## 2023-01-30 MED ORDER — DENOSUMAB 120 MG/1.7ML ~~LOC~~ SOLN
120.0000 mg | Freq: Once | SUBCUTANEOUS | Status: AC
  Administered 2023-01-30: 120 mg via SUBCUTANEOUS
  Filled 2023-01-30: qty 1.7

## 2023-01-30 MED ORDER — CYANOCOBALAMIN 1000 MCG/ML IJ SOLN
1000.0000 ug | Freq: Once | INTRAMUSCULAR | Status: AC
  Administered 2023-01-30: 1000 ug via INTRAMUSCULAR
  Filled 2023-01-30: qty 1

## 2023-01-30 NOTE — Progress Notes (Signed)
Verbal consent given by S. Covington to give Xgeva with Bun 35 and Crea at 2.01

## 2023-01-30 NOTE — Progress Notes (Signed)
Hematology and Oncology Follow Up Visit  Richard [name] [id number] [birth year] 82 y.o. 01/30/2023   Principle Diagnosis:  Diffuse large cell non-Hodgkin's lymphoma (IPI = 3) - NOT "double hit" Pernicious anemia Iron deficiency secondary to bleeding   Past Therapy: R-CHOP - s/p cycle 8 - completed 08/2017   Current Therapy:        Vitamin B12 1 mg IM every month Xgeva 120 mg subcu q 3 months - next dose due in  05/2023 IV iron as indicated   Interim History:  Richard [name] is here today for follow-up and B 12 injection. He is doing quite well and has no complaints at this time.   He played 18 holes of golf yesterday. On Oct 1st he is due to have his pacemaker replaced.   No falls or syncope reported.  He did bump into something and has an abrasion on the right knee. No other injury.  He states that the infrared treatment to his face with dermatology worked. He states that they plan to treat the dry thinning skin on his arms next.   No fever, chills, n/v, cough, dizziness, SOB, chest pain, palpitations, abdominal pain or changes in bowel or bladder habits.  No swelling or tenderness in his extremities.  Neuropathy in the lower extremities unchanged.  Appetite and hydration are good.  Wt Readings from Last 3 Encounters:  01/30/23 239 lb (108.4 kg)  12/24/22 235 lb (106.6 kg)  11/26/22 235 lb 1.3 oz (106.6 kg)     ECOG Performance Status: 1 - Symptomatic but completely ambulatory  Medications:  Allergies as of 01/30/2023       Reactions   Ace Inhibitors Swelling, Other (See Comments)   Angioedema   Benazepril Swelling, Other (See Comments)   Angioedema; he is not a candidate for any angiotensin receptor blockers because of this significant allergic reaction. Because of a history of documented adverse serious drug reaction;Medi Alert bracelet  is recommended   Entresto [sacubitril-valsartan] Swelling, Other (See Comments)   First-in-Class Angiotensin Receptor Neprilysin  Inhibitor- Med was "red-flagged" by the patient's pharmacy for him to NOT take!   Hctz [hydrochlorothiazide] Anaphylaxis, Swelling   Tongue and lip swelling   Aspirin Other (See Comments)   Gastritis, can aspirin not take 325 mg aspirin        Medication List        Accurate as of January 30, 2023  9:29 AM. If you have any questions, ask your nurse or doctor.          acetaminophen 325 MG tablet Commonly known as: TYLENOL Take 650 mg by mouth at bedtime as needed for moderate pain or headache.   allopurinol 300 MG tablet Commonly known as: ZYLOPRIM Take 450 mg by mouth daily.   aluminum hydroxide-magnesium carbonate 95-358 MG/15ML Susp Commonly known as: GAVISCON Take 15 mLs by mouth as needed for indigestion or heartburn.   atorvastatin 80 MG tablet Commonly known as: LIPITOR TAKE 1 TABLET BY MOUTH EVERYDAY AT BEDTIME   Azelastine HCl 137 MCG/SPRAY Soln PLACE 2 SPRAYS INTO BOTH NOSTRILS AT BEDTIME AS NEEDED FOR RHINITIS OR ALLERGIES.   CO Q-10 PO Take 1 capsule by mouth daily.   Eliquis 2.5 MG Tabs tablet Generic drug: apixaban TAKE 1 TABLET BY MOUTH TWICE A DAY   esomeprazole 40 MG capsule Commonly known as: NexIUM Take 1 capsule (40 mg total) by mouth daily. What changed: when to take this   ezetimibe 10 MG tablet Commonly known as: ZETIA Take  1 tablet (10 mg total) by mouth daily.   Farxiga 10 MG Tabs tablet Generic drug: dapagliflozin propanediol Take 10 mg by mouth in the morning.   fenofibrate 160 MG tablet TAKE 1 TABLET BY MOUTH EVERY DAY   folic acid 1 MG tablet Commonly known as: FOLVITE TAKE 2 TABLETS BY MOUTH EVERY DAY   freestyle lancets USE ONCE A DAY TO CHECK BLOOD SUGAR.   FREESTYLE LITE test strip Generic drug: glucose blood USE TO TEST BLOOD SUGAR ONCE A DAY. DX CODE: E11.9   glimepiride 2 MG tablet Commonly known as: AMARYL TAKE 1 TABLET (2 MG TOTAL) BY MOUTH IN THE MORNING AND AT BEDTIME.   ICY HOT ADVANCED PAIN RELIEF  EX Apply 1 application  topically daily as needed (to painful sites).   Klor-Con M20 20 MEQ tablet Generic drug: potassium chloride SA TAKE 2 TABLETS BY MOUTH DAILY   metoprolol succinate 25 MG 24 hr tablet Commonly known as: TOPROL-XL Take 1 tablet (25 mg total) by mouth daily.   nitroGLYCERIN 0.4 MG SL tablet Commonly known as: NITROSTAT PLACE 1 TABLET UNDER THE TONGUE EVERY 5 MINUTES AS NEEDED FOR CHEST PAIN.   pregabalin 100 MG capsule Commonly known as: Lyrica Take 1 capsule (100 mg total) by mouth daily. At noon   pregabalin 150 MG capsule Commonly known as: LYRICA Take 1 capsule (150 mg total) by mouth at bedtime. (Take 100mg  daily at noon)   psyllium 58.6 % powder Commonly known as: METAMUCIL Take 1 packet by mouth daily as needed (for constipation- mix and drink).   senna 8.6 MG Tabs tablet Commonly known as: SENOKOT Take 2 tablets (17.2 mg total) by mouth at bedtime. This is for constipation.  Stop taking if you start having diarrhea. What changed:  when to take this reasons to take this   torsemide 20 MG tablet Commonly known as: DEMADEX TAKE 2 TABLETS (40 MG TOTAL) BY MOUTH IN THE MORNING. DO NOT TAKE ON 01/24/2022.   Trulicity 3 MG/0.5ML Sopn Generic drug: Dulaglutide Inject 3 mg into the skin once a week.   Vascepa 1 g capsule Generic drug: icosapent Ethyl TAKE 2 CAPSULES BY MOUTH 2 TIMES DAILY.   VITAMIN C PO Take 1 tablet by mouth daily with breakfast.        Allergies:  Allergies  Allergen Reactions   Ace Inhibitors Swelling and Other (See Comments)    Angioedema    Benazepril Swelling and Other (See Comments)    Angioedema; he is not a candidate for any angiotensin receptor blockers because of this significant allergic reaction. Because of a history of documented adverse serious drug reaction;Medi Alert bracelet  is recommended   Entresto [Sacubitril-Valsartan] Swelling and Other (See Comments)    First-in-Class Angiotensin Receptor  Neprilysin Inhibitor- Med was "red-flagged" by the patient's pharmacy for him to NOT take!   Hctz [Hydrochlorothiazide] Anaphylaxis and Swelling    Tongue and lip swelling    Aspirin Other (See Comments)    Gastritis, can aspirin not take 325 mg aspirin    Past Medical History, Surgical history, Social history, and Family History were reviewed and updated.  Review of Systems: All other 10 point review of systems is negative.   Physical Exam:  height is 6' (1.829 m) and weight is 239 lb (108.4 kg). His oral temperature is 97.8 F (36.6 C). His blood pressure is 122/60 and his pulse is 81. His respiration is 17 and oxygen saturation is 100%.   Wt Readings from Last  3 Encounters:  01/30/23 239 lb (108.4 kg)  12/24/22 235 lb (106.6 kg)  11/26/22 235 lb 1.3 oz (106.6 kg)    Ocular: Sclerae unicteric, pupils equal, round and reactive to light Ear-nose-throat: Oropharynx clear, dentition fair Lymphatic: No cervical or supraclavicular adenopathy Lungs no rales or rhonchi, good excursion bilaterally Heart regular rate and rhythm, no murmur appreciated Abd soft, nontender, positive bowel sounds MSK no focal spinal tenderness, no joint edema Neuro: non-focal, well-oriented, appropriate affect Derm: Chronic scaly rash of skin throughout is stable  Lab Results  Component Value Date   WBC 6.5 01/30/2023   HGB 14.2 01/30/2023   HCT 44.4 01/30/2023   MCV 92.5 01/30/2023   PLT 167 01/30/2023   Lab Results  Component Value Date   FERRITIN 1,814 (H) 12/24/2022   IRON 102 12/24/2022   TIBC 371 12/24/2022   UIBC 269 12/24/2022   IRONPCTSAT 28 12/24/2022   Lab Results  Component Value Date   RETICCTPCT 1.8 01/30/2023   RBC 4.76 01/30/2023   RBC 4.80 01/30/2023   RETICCTABS 52.1 11/22/2011   No results found for: "KPAFRELGTCHN", "LAMBDASER", "[facility]" Lab Results  Component Value Date   IGA 159 03/09/2012   Lab Results  Component Value Date   ALBUMINELP 4.2 07/27/2018    MSPIKE Not Observed 07/27/2018     Chemistry      Component Value Date/Time   NA 139 12/24/2022 1600   NA 138 02/08/2022 0811   NA 144 06/27/2017 0857   K 4.3 12/24/2022 1600   K 3.9 06/27/2017 0857   CL 103 12/24/2022 1600   CL 103 06/27/2017 0857   CO2 24 12/24/2022 1600   CO2 26 06/27/2017 0857   BUN 58 (H) 12/24/2022 1600   BUN 46 (H) 02/08/2022 0811   BUN 12 06/27/2017 0857   CREATININE 2.50 (H) 12/24/2022 1600   CREATININE 1.65 (H) 04/18/2020 0956      Component Value Date/Time   CALCIUM 10.3 12/24/2022 1600   CALCIUM 9.2 06/27/2017 0857   ALKPHOS 65 12/24/2022 1600   ALKPHOS 128 (H) 06/27/2017 0857   AST 22 12/24/2022 1600   ALT 16 12/24/2022 1600   ALT 24 06/27/2017 0857   BILITOT 0.7 12/24/2022 1600       Impression and Plan: Richard [name] is a very pleasant 82 yo caucasian gentleman with diffuse large B-cell lymphoma (not "double hit" lymphoma).  He completed treatment in February 2019.  Iron studies pending.   B12 given today.  Rivka Barbara due again in Nov RTC MD, labs ( CBC w/, CMP, ferritin, iron, retic, LDH)-[place]  Rushie Chestnut, PA-C  8/1/20249:29 AM

## 2023-01-30 NOTE — Patient Instructions (Addendum)
Vitamin B12 Injection What is this medication? Vitamin B12 (VAHY tuh min B12) prevents and treats low vitamin B12 levels in your body. It is used in people who do not get enough vitamin B12 from their diet or when their digestive tract does not absorb enough. Vitamin B12 plays an important role in maintaining the health of your nervous system and red blood cells. This medicine may be used for other purposes; ask your health care provider or pharmacist if you have questions. COMMON BRAND NAME(S): B-12 Compliance Kit, B-12 Injection Kit, Cyomin, Dodex, LA-12, Nutri-Twelve, Physicians EZ Use B-12, Primabalt, Vitamin Deficiency Injectable System - B12 What should I tell my care team before I take this medication? They need to know if you have any of these conditions: Kidney disease Leber's disease Megaloblastic anemia An unusual or allergic reaction to cyanocobalamin, cobalt, other medications, foods, dyes, or preservatives Pregnant or trying to get pregnant Breast-feeding How should I use this medication? This medication is injected into a muscle or deeply under the skin. It is usually given in a clinic or care team's office. However, your care team may teach you how to inject yourself. Follow all instructions. Talk to your care team about the use of this medication in children. Special care may be needed. Overdosage: If you think you have taken too much of this medicine contact a poison control center or emergency room at once. NOTE: This medicine is only for you. Do not share this medicine with others. What if I miss a dose? If you are given your dose at a clinic or care team's office, call to reschedule your appointment. If you give your own injections, and you miss a dose, take it as soon as you can. If it is almost time for your next dose, take only that dose. Do not take double or extra doses. What may interact with this medication? Alcohol Colchicine This list may not describe all possible  interactions. Give your health care provider a list of all the medicines, herbs, non-prescription drugs, or dietary supplements you use. Also tell them if you smoke, drink alcohol, or use illegal drugs. Some items may interact with your medicine. What should I watch for while using this medication? Visit your care team regularly. You may need blood work done while you are taking this medication. You may need to follow a special diet. Talk to your care team. Limit your alcohol intake and avoid smoking to get the best benefit. What side effects may I notice from receiving this medication? Side effects that you should report to your care team as soon as possible: Allergic reactions--skin rash, itching, hives, swelling of the face, lips, tongue, or throat Swelling of the ankles, hands, or feet Trouble breathing Side effects that usually do not require medical attention (report to your care team if they continue or are bothersome): Diarrhea This list may not describe all possible side effects. Call your doctor for medical advice about side effects. You may report side effects to FDA at 1-800-FDA-1088. Where should I keep my medication? Keep out of the reach of children. Store at room temperature between 15 and 30 degrees C (59 and 85 degrees F). Protect from light. Throw away any unused medication after the expiration date. NOTE: This sheet is a summary. It may not cover all possible information. If you have questions about this medicine, talk to your doctor, pharmacist, or health care provider.  2024 Elsevier/Gold Standard (2021-02-27 00:00:00) Denosumab Injection (Oncology) What is this medication? DENOSUMAB (  den oh SUE mab) prevents weakened bones caused by cancer. It may also be used to treat noncancerous bone tumors that cannot be removed by surgery. It can also be used to treat high calcium levels in the blood caused by cancer. It works by blocking a protein that causes bones to break down  quickly. This slows down the release of calcium from bones, which lowers calcium levels in your blood. It also makes your bones stronger and less likely to break (fracture). This medicine may be used for other purposes; ask your health care provider or pharmacist if you have questions. COMMON BRAND NAME(S): XGEVA What should I tell my care team before I take this medication? They need to know if you have any of these conditions: Dental disease Having surgery or tooth extraction Infection Kidney disease Low levels of calcium or vitamin D in the blood Malnutrition On hemodialysis Skin conditions or sensitivity Thyroid or parathyroid disease An unusual reaction to denosumab, other medications, foods, dyes, or preservatives Pregnant or trying to get pregnant Breast-feeding How should I use this medication? This medication is for injection under the skin. It is given by your care team in a hospital or clinic setting. A special MedGuide will be given to you before each treatment. Be sure to read this information carefully each time. Talk to your care team about the use of this medication in children. While it may be prescribed for children as young as 13 years for selected conditions, precautions do apply. Overdosage: If you think you have taken too much of this medicine contact a poison control center or emergency room at once. NOTE: This medicine is only for you. Do not share this medicine with others. What if I miss a dose? Keep appointments for follow-up doses. It is important not to miss your dose. Call your care team if you are unable to keep an appointment. What may interact with this medication? Do not take this medication with any of the following: Other medications containing denosumab This medication may also interact with the following: Medications that lower your chance of fighting infection Steroid medications, such as prednisone or cortisone This list may not describe all  possible interactions. Give your health care provider a list of all the medicines, herbs, non-prescription drugs, or dietary supplements you use. Also tell them if you smoke, drink alcohol, or use illegal drugs. Some items may interact with your medicine. What should I watch for while using this medication? Your condition will be monitored carefully while you are receiving this medication. You may need blood work while taking this medication. This medication may increase your risk of getting an infection. Call your care team for advice if you get a fever, chills, sore throat, or other symptoms of a cold or flu. Do not treat yourself. Try to avoid being around people who are sick. You should make sure you get enough calcium and vitamin D while you are taking this medication, unless your care team tells you not to. Discuss the foods you eat and the vitamins you take with your care team. Some people who take this medication have severe bone, joint, or muscle pain. This medication may also increase your risk for jaw problems or a broken thigh bone. Tell your care team right away if you have severe pain in your jaw, bones, joints, or muscles. Tell your care team if you have any pain that does not go away or that gets worse. Talk to your care team if you may  be pregnant. Serious birth defects can occur if you take this medication during pregnancy and for 5 months after the last dose. You will need a negative pregnancy test before starting this medication. Contraception is recommended while taking this medication and for 5 months after the last dose. Your care team can help you find the option that works for you. What side effects may I notice from receiving this medication? Side effects that you should report to your care team as soon as possible: Allergic reactions--skin rash, itching, hives, swelling of the face, lips, tongue, or throat Bone, joint, or muscle pain Low calcium level--muscle pain or cramps,  confusion, tingling, or numbness in the hands or feet Osteonecrosis of the jaw--pain, swelling, or redness in the mouth, numbness of the jaw, poor healing after dental work, unusual discharge from the mouth, visible bones in the mouth Side effects that usually do not require medical attention (report to your care team if they continue or are bothersome): Cough Diarrhea Fatigue Headache Nausea This list may not describe all possible side effects. Call your doctor for medical advice about side effects. You may report side effects to FDA at 1-800-FDA-1088. Where should I keep my medication? This medication is given in a hospital or clinic. It will not be stored at home. NOTE: This sheet is a summary. It may not cover all possible information. If you have questions about this medicine, talk to your doctor, pharmacist, or health care provider.  2024 Elsevier/Gold Standard (2021-11-07 00:00:00)

## 2023-01-30 NOTE — Progress Notes (Signed)
Pt states he is currently taking his calcuim

## 2023-02-04 ENCOUNTER — Ambulatory Visit (INDEPENDENT_AMBULATORY_CARE_PROVIDER_SITE_OTHER): Payer: Medicare Other | Admitting: Dermatology

## 2023-02-04 VITALS — BP 113/66 | HR 82

## 2023-02-04 DIAGNOSIS — L570 Actinic keratosis: Secondary | ICD-10-CM

## 2023-02-04 DIAGNOSIS — Z7189 Other specified counseling: Secondary | ICD-10-CM

## 2023-02-04 DIAGNOSIS — L814 Other melanin hyperpigmentation: Secondary | ICD-10-CM

## 2023-02-04 DIAGNOSIS — Z1283 Encounter for screening for malignant neoplasm of skin: Secondary | ICD-10-CM | POA: Diagnosis not present

## 2023-02-04 DIAGNOSIS — W908XXA Exposure to other nonionizing radiation, initial encounter: Secondary | ICD-10-CM

## 2023-02-04 DIAGNOSIS — L820 Inflamed seborrheic keratosis: Secondary | ICD-10-CM | POA: Diagnosis not present

## 2023-02-04 DIAGNOSIS — L821 Other seborrheic keratosis: Secondary | ICD-10-CM

## 2023-02-04 DIAGNOSIS — L578 Other skin changes due to chronic exposure to nonionizing radiation: Secondary | ICD-10-CM | POA: Diagnosis not present

## 2023-02-04 DIAGNOSIS — Z85828 Personal history of other malignant neoplasm of skin: Secondary | ICD-10-CM | POA: Diagnosis not present

## 2023-02-04 DIAGNOSIS — D1801 Hemangioma of skin and subcutaneous tissue: Secondary | ICD-10-CM | POA: Diagnosis not present

## 2023-02-04 DIAGNOSIS — D229 Melanocytic nevi, unspecified: Secondary | ICD-10-CM

## 2023-02-04 NOTE — Progress Notes (Signed)
Follow-Up Visit   Subjective  Richard [name] is a 82 y.o. male who presents for the following: Skin Cancer Screening and Upper Body Skin Exam  The patient presents for Upper Body Skin Exam (UBSE) for skin cancer screening and mole check. The patient has spots, moles and lesions to be evaluated, some may be new or changing. He has a sore spot on his right posterior shoulder. Red light treatment to the face 11/05/2022 with a good reaction. He has a history of multiple SCCs.   The following portions of the chart were reviewed this encounter and updated as appropriate: medications, allergies, medical history  Review of Systems:  No other skin or systemic complaints except as noted in HPI or Assessment and Plan.  Objective  Well appearing patient in no apparent distress; mood and affect are within normal limits.  All skin waist up examined. Relevant physical exam findings are noted in the Assessment and Plan.  R posterior shoulder x 2, R post upper arm x 1, R ant shoulder x 2, R lower neck x 2, R hand x 2, L hand x 1, L forearm x 1, L upper arm x 3, L chest x 3 (17) Erythematous stuck-on, waxy papule or plaque    Assessment & Plan   Inflamed seborrheic keratosis (17) R posterior shoulder x 2, R post upper arm x 1, R ant shoulder x 2, R lower neck x 2, R hand x 2, L hand x 1, L forearm x 1, L upper arm x 3, L chest x 3  vs Hypertrophic AKs.  Symptomatic, irritating, patient would like treated.  Destruction of lesion - R posterior shoulder x 2, R post upper arm x 1, R ant shoulder x 2, R lower neck x 2, R hand x 2, L hand x 1, L forearm x 1, L upper arm x 3, L chest x 3 (17)  Destruction method: cryotherapy   Informed consent: discussed and consent obtained   Lesion destroyed using liquid nitrogen: Yes   Region frozen until ice ball extended beyond lesion: Yes   Outcome: patient tolerated procedure well with no complications   Post-procedure details: wound care instructions given    Additional details:  Prior to procedure, discussed risks of blister formation, small wound, skin dyspigmentation, or rare scar following cryotherapy. Recommend Vaseline ointment to treated areas while healing.   Counseling and coordination of care  Nevus  History of SCC (squamous cell carcinoma) of skin  Skin cancer screening  Actinic skin damage  Lentigo  Seborrheic keratosis   Skin cancer screening performed today.  ACTINIC DAMAGE WITH PRECANCEROUS ACTINIC KERATOSES Counseling for Topical Chemotherapy Management: Patient exhibits: - Severe, confluent actinic changes with pre-cancerous actinic keratoses that is secondary to cumulative UV radiation exposure over time - Condition that is severe; chronic, not at goal. - diffuse scaly erythematous macules and papules with underlying dyspigmentation - Discussed Prescription "Field Treatment" topical Chemotherapy for Severe, Chronic Confluent Actinic Changes with Pre-Cancerous Actinic Keratoses Field treatment involves treatment of an entire area of skin that has confluent Actinic Changes (Sun/ Ultraviolet light damage) and PreCancerous Actinic Keratoses by method of PhotoDynamic Therapy (PDT) and/or prescription Topical Chemotherapy agents such as 5-fluorouracil, 5-fluorouracil/calcipotriene, and/or imiquimod.  The purpose is to decrease the number of clinically evident and subclinical PreCancerous lesions to prevent progression to development of skin cancer by chemically destroying early precancer changes that may or may not be visible.  It has been shown to reduce the risk of developing  skin cancer in the treated area. As a result of treatment, redness, scaling, crusting, and open sores may occur during treatment course. One or more than one of these methods may be used and may have to be used several times to control, suppress and eliminate the PreCancerous changes. Discussed treatment course, expected reaction, and possible side  effects. - Recommend daily broad spectrum sunscreen SPF 30+ to sun-exposed areas, reapply every 2 hours as needed.  - Staying in the shade or wearing long sleeves, sun glasses (UVA+UVB protection) and wide brim hats (4-inch brim around the entire circumference of the hat) are also recommended. - Call for new or changing lesions. - Recommend red light PDT with debridement to face, 2nd treatment.  Patient will schedule.    Lentigines, Seborrheic Keratoses, Hemangiomas - Benign normal skin lesions - Benign-appearing - Call for any changes  Melanocytic Nevi - Tan-brown and/or pink-flesh-colored symmetric macules and papules - Benign appearing on exam today - Observation - Call clinic for new or changing moles - Recommend daily use of broad spectrum spf 30+ sunscreen to sun-exposed areas.   HISTORY OF SQUAMOUS CELL CARCINOMA OF THE SKIN Multiple, see history - No evidence of recurrence today - Recommend regular full body skin exams - Recommend daily broad spectrum sunscreen SPF 30+ to sun-exposed areas, reapply every 2 hours as needed.  - Call if any new or changing lesions are noted between office visits     Return in about 3 months (around 05/07/2023) for Red light with debridement to face; 6 mos with Dr Kathie Rhodes AKs.Wendee Beavers, CMA, am acting as scribe for Willeen Niece, MD .   Documentation: I have reviewed the above documentation for accuracy and completeness, and I agree with the above.  Willeen Niece, MD

## 2023-02-04 NOTE — Patient Instructions (Addendum)
Cryotherapy Aftercare  Wash gently with soap and water everyday.   Apply Vaseline and Band-Aid daily until healed.      Due to recent changes in healthcare laws, you may see results of your pathology and/or laboratory studies on MyChart before the doctors have had a chance to review them. We understand that in some cases there may be results that are confusing or concerning to you. Please understand that not all results are received at the same time and often the doctors may need to interpret multiple results in order to provide you with the best plan of care or course of treatment. Therefore, we ask that you please give [place] 2 business days to thoroughly review all your results before contacting the office for clarification. Should we see a critical lab result, you will be contacted sooner.   If You Need Anything After Your Visit  If you have any questions or concerns for your doctor, please call our main line at [phone] and press option 4 to reach your doctor's medical assistant. If no one answers, please leave a voicemail as directed and we will return your call as soon as possible. Messages left after 4 pm will be answered the following business day.   You may also send [place] a message via MyChart. We typically respond to MyChart messages within 1-2 business days.  For prescription refills, please ask your pharmacy to contact our office. Our fax number is [phone].  If you have an urgent issue when the clinic is closed that cannot wait until the next business day, you can page your doctor at the number below.    Please note that while we do our best to be available for urgent issues outside of office hours, we are not available 24/7.   If you have an urgent issue and are unable to reach [place], you may choose to seek medical care at your doctor's office, retail clinic, urgent care center, or emergency room.  If you have a medical emergency, please immediately call 911 or go to the  emergency department.  Pager Numbers  - Dr. Gwen Pounds: [phone]  - Dr. Roseanne Reno: [phone]  In the event of inclement weather, please call our main line at [phone] for an update on the status of any delays or closures.  Dermatology Medication Tips: Please keep the boxes that topical medications come in in order to help keep track of the instructions about where and how to use these. Pharmacies typically print the medication instructions only on the boxes and not directly on the medication tubes.   If your medication is too expensive, please contact our office at [phone] option 4 or send [place] a message through MyChart.   We are unable to tell what your co-pay for medications will be in advance as this is different depending on your insurance coverage. However, we may be able to find a substitute medication at lower cost or fill out paperwork to get insurance to cover a needed medication.   If a prior authorization is required to get your medication covered by your insurance company, please allow [place] 1-2 business days to complete this process.  Drug prices often vary depending on where the prescription is filled and some pharmacies may offer cheaper prices.  The website www.goodrx.com contains coupons for medications through different pharmacies. The prices here do not account for what the cost may be with help from insurance (it may be cheaper with your insurance), but the website can give you the price  if you did not use any insurance.  - You can print the associated coupon and take it with your prescription to the pharmacy.  - You may also stop by our office during regular business hours and pick up a GoodRx coupon card.  - If you need your prescription sent electronically to a different pharmacy, notify our office through [facility] or by phone at [phone] option 4.     Si Usted Necesita Algo Despus de Su Visita  Tambin puede enviarnos un mensaje a travs  de Clinical cytogeneticist. Por lo general respondemos a los mensajes de MyChart en el transcurso de 1 a 2 das hbiles.  Para renovar recetas, por favor pida a su farmacia que se ponga en contacto con nuestra oficina. Annie Sable de fax es [place] [phone].  Si tiene un asunto urgente cuando la clnica est cerrada y que no puede esperar hasta el siguiente da hbil, puede llamar/localizar a su doctor(a) al nmero que aparece a continuacin.   Por favor, tenga en cuenta que aunque hacemos todo lo posible para estar disponibles para asuntos urgentes fuera del horario de [place], no estamos disponibles las 24 horas del da, los 7 [address]  [address] de la [place].   Si tiene un problema urgente y no puede comunicarse con nosotros, puede optar por buscar atencin mdica  en el consultorio de su doctor(a), en una clnica privada, en un centro de atencin urgente o en una sala de emergencias.  Si tiene Engineer, drilling, por favor llame inmediatamente al 911 o vaya a la sala de emergencias.  Nmeros de bper  - Dr. Gwen Pounds: [phone]  - Dra. Roseanne Reno: [phone]  En caso de inclemencias del [place], por favor llame a Lacy Duverney principal al [phone] para una actualizacin sobre el [place] de cualquier retraso o cierre.  Consejos para la medicacin en dermatologa: Por favor, guarde las cajas en las que vienen los medicamentos de uso tpico para ayudarle a seguir las instrucciones sobre dnde y cmo usarlos. Las farmacias generalmente imprimen las instrucciones del medicamento slo en las cajas y no directamente en los tubos del [place].   Si su medicamento es muy caro, por favor, pngase en contacto con Rolm Gala llamando al [phone] y presione la opcin 4 o envenos un mensaje a travs de Clinical cytogeneticist.   No podemos decirle cul ser su copago por los medicamentos por adelantado ya que esto es diferente dependiendo de la cobertura de su seguro. Sin embargo, es posible que podamos encontrar un  medicamento sustituto a Audiological scientist un formulario para que el seguro cubra el medicamento que se considera necesario.   Si se requiere una autorizacin previa para que su compaa de seguros [place] su medicamento, por favor permtanos de 1 a 2 das hbiles para completar [address]  Los precios de los medicamentos varan con frecuencia dependiendo del Environmental consultant de dnde se surte la receta y alguna farmacias pueden ofrecer precios ms baratos.  El sitio web www.goodrx.com tiene cupones para medicamentos de Health and safety inspector. Los precios aqu no tienen en cuenta lo que podra costar con la ayuda del seguro (puede ser ms barato con su seguro), pero el sitio web puede darle el precio si no utiliz Tourist information centre manager.  - Puede imprimir el cupn correspondiente y llevarlo con su receta a la farmacia.  - Tambin puede pasar por nuestra oficina durante el horario de atencin regular y Education officer, museum una tarjeta de cupones de GoodRx.  - Si necesita que su receta se enve electrnicamente a  una farmacia diferente, informe a nuestra oficina a travs de MyChart de [place] o por telfono llamando al [phone] y presione la opcin 4.

## 2023-02-05 ENCOUNTER — Other Ambulatory Visit: Payer: Self-pay | Admitting: *Deleted

## 2023-02-05 MED ORDER — TORSEMIDE 20 MG PO TABS: 40.0000 mg | ORAL_TABLET | Freq: Every morning | ORAL | 1 refills | Status: DC

## 2023-02-15 ENCOUNTER — Other Ambulatory Visit: Payer: Self-pay | Admitting: Family Medicine

## 2023-02-15 DIAGNOSIS — N183 Chronic kidney disease, stage 3 unspecified: Secondary | ICD-10-CM

## 2023-02-15 DIAGNOSIS — E781 Pure hyperglyceridemia: Secondary | ICD-10-CM

## 2023-02-17 ENCOUNTER — Ambulatory Visit: Payer: Medicare Other | Admitting: Pharmacist

## 2023-02-17 ENCOUNTER — Other Ambulatory Visit (HOSPITAL_BASED_OUTPATIENT_CLINIC_OR_DEPARTMENT_OTHER): Payer: Self-pay

## 2023-02-17 DIAGNOSIS — E785 Hyperlipidemia, unspecified: Secondary | ICD-10-CM

## 2023-02-17 DIAGNOSIS — I5042 Chronic combined systolic (congestive) and diastolic (congestive) heart failure: Secondary | ICD-10-CM

## 2023-02-17 DIAGNOSIS — I129 Hypertensive chronic kidney disease with stage 1 through stage 4 chronic kidney disease, or unspecified chronic kidney disease: Secondary | ICD-10-CM

## 2023-02-17 DIAGNOSIS — I25118 Atherosclerotic heart disease of native coronary artery with other forms of angina pectoris: Secondary | ICD-10-CM

## 2023-02-17 MED ORDER — TORSEMIDE 20 MG PO TABS: 40.0000 mg | ORAL_TABLET | Freq: Every morning | ORAL | 0 refills | Status: DC

## 2023-02-17 MED ORDER — TRULICITY 3 MG/0.5ML ~~LOC~~ SOAJ
3.0000 mg | SUBCUTANEOUS | 1 refills | Status: DC
  Filled 2023-02-17: qty 6, 84d supply, fill #0
  Filled 2023-05-24: qty 6, 84d supply, fill #1

## 2023-02-17 NOTE — Progress Notes (Signed)
Pharmacy Note  02/17/2023 Name: Richard [name] MRN: [id number] DOB: Feb 14, 1941  Subjective: Richard [name] is a 82 y.o. year old male who is a primary care patient of Zola Button, Grayling Congress, DO. Clinical Pharmacist Practitioner referral was placed to assist with medication and diabetes management.    Engaged with patient by telephone for follow up visit today.  Type 2 DM -  Current therapy - Trulicity 3mg  weekly, glimepiride 2mg  twice a day and Farxiga 10mg  daily.  Past therapies: metformin stopped due to rising Scr.  Initially recommended Ozempic instead of Trulicity but Trulicity was preferred by patient's insurance.   Tolerating Trulicity well.  Current home blood glucose readings 125 to 160  Denies any hypoglycemia events since our last visit.   Wt Readings from Last 3 Encounters:  01/30/23 239 lb (108.4 kg)  12/24/22 235 lb (106.6 kg)  11/26/22 235 lb 1.3 oz (106.6 kg)   Weight 1 year ago 10/26/2021 was 251lbs.  (Has lost about 11 lbs since starting GLP1  Diet - has been able to control appetite better since starting Trulicity. He is limiting sweets and sweetened beverages.  Breakfast - MWF when playing golf- cereal and banana; days he doesn't play golf he eats eggs and toast.  Lunch - grilled cheese sandwich + / - soup Dinner - meat with 2 vegetables - limits potatoes/  Likes green beans, slaw,   Uses Splenda  Exercise - golfs 9 to 18 holes 2 or 3 times per week. States he feels great.   CHF:  Patient is weighing daily. He will take an extra torsemide if needed for weight gain of more than 2 - 3 lbs in 24 hours or 5lbs in a week.  Reports he takes an extra torsemide about 2 times per week. He is concerned about running out of torsemide early.  Current medications: torsemide 20mg   - 2 tabs = 40mg  daily, Farxiga 10mg  daily (for DM, CHF and CKD)  Patient will have surgery to replace his pacemaker in April 01, 2023.  Medication management:  Current therapy: fenofibrate  160mg , atorvastatin 80mg  daiyl, ezetimibe 10mg  daily and icosappent ethyl - 2 grams twice a day. Enrolled in Hyperlipidemia and CHF / Cardiomyopathy [employer] funds which covers all his cholesterol medications and heart medications.   Objective: Review of patient status, including review of consultants reports, laboratory and other test data, was performed as part of comprehensive.  Lab Results  Component Value Date   CREATININE 2.01 (H) 01/30/2023   CREATININE 2.50 (H) 12/24/2022   CREATININE 1.99 (H) 11/26/2022    Lab Results  Component Value Date   HGBA1C 7.1 (H) 11/26/2022       Component Value Date/Time   CHOL 154 11/26/2022 1006   CHOL 162 11/23/2020 0828   TRIG 387.0 (H) 11/26/2022 1006   HDL 31.40 (L) 11/26/2022 1006   HDL 38 (L) 11/23/2020 0828   CHOLHDL 5 11/26/2022 1006   VLDL 77.4 (H) 11/26/2022 1006   LDLCALC 76 01/25/2022 1218   LDLCALC 89 11/23/2020 0828   LDLCALC  04/18/2020 0956     Comment:     . LDL cholesterol not calculated. Triglyceride levels greater than 400 mg/dL invalidate calculated LDL results. . Reference range: <100 . Desirable range <100 mg/dL for primary prevention;   <70 mg/dL for patients with CHD or diabetic patients  with > or = 2 CHD risk factors. [name] LDL-C is now calculated using the Martin-Hopkins  calculation, which is a validated novel  method providing  better accuracy than the Friedewald equation in the  estimation of LDL-C.  Horald Pollen et al. Lenox Ahr. 8657;846(96): 2061-2068  (http://education.[facility].com/faq/FAQ164)    LDLDIRECT 78.0 11/26/2022 1006     Clinical ASCVD: Yes  The ASCVD Risk score (Arnett DK, et al., 2019) failed to calculate for the following reasons:   The 2019 ASCVD risk score is only valid for ages 1 to 55   The patient has a prior MI or stroke diagnosis    BP Readings from Last 3 Encounters:  02/04/23 113/66  01/30/23 122/60  12/24/22 (!) 126/51     Allergies  Allergen Reactions   Ace  Inhibitors Swelling and Other (See Comments)    Angioedema    Benazepril Swelling and Other (See Comments)    Angioedema; he is not a candidate for any angiotensin receptor blockers because of this significant allergic reaction. Because of a history of documented adverse serious drug reaction;Medi Alert bracelet  is recommended   Entresto [Sacubitril-Valsartan] Swelling and Other (See Comments)    First-in-Class Angiotensin Receptor Neprilysin Inhibitor- Med was "red-flagged" by the patient's pharmacy for him to NOT take!   Hctz [Hydrochlorothiazide] Anaphylaxis and Swelling    Tongue and lip swelling    Aspirin Other (See Comments)    Gastritis, can aspirin not take 325 mg aspirin    Medications Reviewed Today     Reviewed by Henrene Pastor, RPH-CPP (Pharmacist) on 02/17/23 at 1442  Med List Status: <None>   Medication Order Taking? Sig Documenting Provider Last Dose Status Informant  acetaminophen (TYLENOL) 325 MG tablet [id number] Yes Take 650 mg by mouth at bedtime as needed for moderate pain or headache. [provider] Taking Active Self  allopurinol (ZYLOPRIM) 300 MG tablet [id number] Yes Take 450 mg by mouth daily. [provider] Taking Active Self           Med Note Antony Madura, Arn Medal   Wed Jan 16, 2022  2:57 PM) 1.5 tablets = 450 mg  aluminum hydroxide-magnesium carbonate (GAVISCON) 95-358 MG/15ML SUSP [id number] Yes Take 15 mLs by mouth as needed for indigestion or heartburn. [provider] Taking Active Self  apixaban (ELIQUIS) 2.5 MG TABS tablet [id number] Yes TAKE 1 TABLET BY MOUTH TWICE A Kenn File, MD Taking Active   Ascorbic Acid (VITAMIN C PO) [id number] Yes Take 1 tablet by mouth daily with breakfast. [provider] Taking Active Self  atorvastatin (LIPITOR) 80 MG tablet [id number] Yes TAKE 1 TABLET BY MOUTH EVERYDAY AT BEDTIME Rollene Rotunda, MD Taking Active   Azelastine HCl 137 MCG/SPRAY SOLN [id number] Yes PLACE 2 SPRAYS INTO  BOTH NOSTRILS AT BEDTIME AS NEEDED FOR RHINITIS OR ALLERGIES. Seabron Spates R, DO Taking Active   Coenzyme Q10 (CO Q-10 PO) [id number] Yes Take 1 capsule by mouth daily. [provider] Taking Active Self  Dulaglutide (TRULICITY) 3 MG/0.5ML SOPN [id number] Yes Inject 3 mg into the skin once a week. Donato Schultz, DO Taking Active   esomeprazole (NEXIUM) 40 MG capsule [id number] Yes Take 1 capsule (40 mg total) by mouth daily.  Patient taking differently: Take 40 mg by mouth daily before breakfast.   Pecola Lawless, MD Taking Active Self  ezetimibe (ZETIA) 10 MG tablet [id number] Yes Take 1 tablet (10 mg total) by mouth daily. Seabron Spates R, DO Taking Active   FARXIGA 10 MG TABS tablet [id number] Yes Take 10 mg by mouth in the morning. [provider] Taking Active Self  fenofibrate 160  MG tablet [id number] Yes TAKE 1 TABLET BY MOUTH EVERY DAY Zola Button, Grayling Congress, DO Taking Active   folic acid (FOLVITE) 1 MG tablet [id number] Yes TAKE 2 TABLETS BY MOUTH EVERY DAY Josph Macho, MD Taking Active   glimepiride (AMARYL) 2 MG tablet [id number] Yes TAKE 1 TABLET (2 MG TOTAL) BY MOUTH IN THE MORNING AND AT BEDTIME. Seabron Spates R, DO Taking Active   glucose blood (FREESTYLE LITE) test strip [id number] Yes USE TO TEST BLOOD SUGAR ONCE A DAY. DX CODE: E11.9 Donato Schultz, DO Taking Active   icosapent Ethyl (VASCEPA) 1 g capsule [id number] Yes TAKE 2 CAPSULES BY MOUTH 2 TIMES DAILY. Zola Button, Grayling Congress, DO Taking Active   KLOR-CON M20 20 MEQ tablet [id number] Yes TAKE 2 TABLETS BY MOUTH DAILY Rollene Rotunda, MD Taking Active   Lancets (FREESTYLE) lancets [id number] Yes USE ONCE A DAY TO CHECK BLOOD SUGAR. Donato Schultz, DO Taking Active   Menthol-Camphor (ICY HOT ADVANCED PAIN RELIEF EX) [id number] Yes Apply 1 application  topically daily as needed (to painful sites). [provider] Taking Active Self  metoprolol succinate (TOPROL-XL) 25 MG  24 hr tablet [id number] Yes TAKE 1 TABLET (25 MG TOTAL) BY MOUTH DAILY. Rollene Rotunda, MD Taking Active   nitroGLYCERIN (NITROSTAT) 0.4 MG SL tablet [id number] Yes PLACE 1 TABLET UNDER THE TONGUE EVERY 5 MINUTES AS NEEDED FOR CHEST PAIN. Rollene Rotunda, MD Taking Active   pregabalin (LYRICA) 100 MG capsule [id number] Yes Take 1 capsule (100 mg total) by mouth daily. At noon [facility], Grayling Congress, DO Taking Active   pregabalin (LYRICA) 150 MG capsule [id number] Yes Take 1 capsule (150 mg total) by mouth at bedtime. (Take 100mg  daily at noon) Zola Button, Grayling Congress, DO Taking Active   psyllium (METAMUCIL) 58.6 % powder [id number] Yes Take 1 packet by mouth daily as needed (for constipation- mix and drink). [provider] Taking Active Self  senna (SENOKOT) 8.6 MG TABS tablet [id number] Yes Take 2 tablets (17.2 mg total) by mouth at bedtime. This is for constipation.  Stop taking if you start having diarrhea.  Patient taking differently: Take 2 tablets by mouth daily as needed for mild constipation. This is for constipation.  Stop taking if you start having diarrhea.   Elease Etienne, MD Taking Active Self  torsemide (DEMADEX) 20 MG tablet [id number] Yes Take 2 tablets (40 mg total) by mouth in the morning. Do not take on 01/24/2022.  Patient taking differently: Take 40 mg by mouth in the morning. Takes extra tablet for weight gain more then 2 to 3 lbs in 24 hours.   Rollene Rotunda, MD Taking Active   Med List Note Mathews Robinsons, CPhT 06/10/13 3299): cpap machine 2 L of oxygen at night            Patient Active Problem List   Diagnosis Date Noted   Permanent atrial fibrillation (HCC) 10/23/2022   Simple chronic bronchitis (HCC) 06/03/2022   Cellulitis of lower extremity 04/30/2022   Type 2 DM with CKD stage 3 and hypertension (HCC) 04/30/2022   Cellulitis of right leg 04/24/2022   Stage 3b chronic kidney disease (CKD) (HCC) 04/24/2022   Lactic acidosis 04/24/2022   Left upper  quadrant pain 04/08/2022   Subacute cough 04/08/2022   Chronic combined systolic and diastolic heart failure (HCC) 02/06/2022   Acute exacerbation of CHF (congestive heart failure) (HCC) 01/16/2022   Hemoptysis 12/26/2021   Subclavian vein stenosis 12/23/2021  Strep throat 11/16/2021   Multiple superficial wounds with infection 01/04/2021   Cardiac pacemaker in situ 11/20/2020   S/P CABG x 4 11/20/2020   Ascending aortic aneurysm (HCC) 11/20/2020   Senile purpura (HCC) 07/27/2020   Porokeratosis 06/20/2020   Pain due to onychomycosis of toenails of both feet 12/14/2019   Educated about COVID-19 virus infection 11/25/2019   DM2 (diabetes mellitus, type 2) (HCC) 08/10/2019   Hyperlipidemia associated with type 2 diabetes mellitus (HCC) 08/10/2019   S/P left TKA 06/29/2019   Peripheral neuropathy 07/27/2018   Uncontrolled type 2 diabetes mellitus with hyperglycemia (HCC) 05/13/2018   Diabetic peripheral neuropathy associated with type 2 diabetes mellitus (HCC) 05/13/2018   Pansinusitis 05/13/2018   Severe aortic stenosis    Complete heart block (HCC)    Paroxysmal atrial fibrillation (HCC)    Acute on chronic diastolic CHF (congestive heart failure), NYHA class 2 (HCC)    Left arm swelling    Status post transcatheter aortic valve replacement (TAVR) using bioprosthesis 09/09/2017   Demand ischemia of myocardium    Typical atrial flutter (HCC)    Acute on chronic diastolic heart failure (HCC)    GIB (gastrointestinal bleeding) 08/16/2017   Hyperlipidemia 08/05/2017   NSTEMI (non-ST elevated myocardial infarction) (HCC)    Aortic stenosis, severe 07/25/2017   Pancytopenia (HCC) 07/24/2017   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) 02/27/2017   Bradycardia 01/04/2017   Coronary artery disease 01/04/2017   Stenosis of carotid artery 01/04/2017   Obesity 09/18/2016   Wenckebach block    Disorder associated with type II diabetes mellitus (HCC) 05/21/2013    Spinal stenosis of lumbar region 04/08/2013   Anemia, iron deficiency 11/29/2011   B12 deficiency anemia 07/30/2011   OSA (obstructive sleep apnea) 07/06/2010   CAD, ARTERY BYPASS GRAFT 08/22/2009   Essential hypertension 03/29/2009   Bilateral lower extremity edema 02/21/2009   Hyperlipidemia LDL goal <70 11/26/2008   GOUT 08/17/2006     Medication Assistance:   High med cost when in coverage gap. Screened for medication assistance program in past - patient did not qualify. [employer] for hypercholesterolemia. Approved for $2500 thru 06/03/2023  Patient reports he has gotten out of Medicare coverage gap and is now in catastrophic coverage phase. Medications are $0 to $10.   Assessment / Plan: Type 2 DM - A1c now 7.1% Continue Trulicity 3mg  weekly, glimepiride 2mg  twice a day and Farxiga 10mg  daily.  Sent in updated Rx for Trulicity for 82 DS. (Patient is aware insurance might limit to just 28 day per fill) Continue to check blood glucose once a day Reviewed s/s of hypoglycemia and how to treat.   CHF:  Continue to check weight daily and take extra torsemide as needed.  Continue to take [place].  Updated Rx for torsemide to allow for occasional extra tablet if needed.   Medication management:  Reviewed and updated medication list  Reviewed refill history and adherence  Meds ordered this encounter  Medications   Dulaglutide (TRULICITY) 3 MG/0.5ML SOPN    Sig: Inject 3 mg into the skin once a week.    Dispense:  6 mL    Refill:  1    Please try 84 day supply if insurance will allow him to  get.   torsemide (DEMADEX) 20 MG tablet    Sig: Take 2 tablets (40 mg total) by mouth in the morning. Take extra tablet for weight gain more then 2 to 3 lbs in 24 hours.  Dispense:  205 tablet    Refill:  0    Please try to fill with updated directions. Please disregard previous prescription.   .  Follow Up:  Telephone follow up appointment with care management team member  scheduled for:  1 to 2 months Made 3 month follow up with PCP 04/10/23 at 10am   Henrene Pastor, PharmD Clinical Pharmacist [facility]  - [facility] [phone]

## 2023-02-18 ENCOUNTER — Encounter (HOSPITAL_BASED_OUTPATIENT_CLINIC_OR_DEPARTMENT_OTHER): Payer: Self-pay | Admitting: Pulmonary Disease

## 2023-02-18 ENCOUNTER — Ambulatory Visit (INDEPENDENT_AMBULATORY_CARE_PROVIDER_SITE_OTHER): Payer: Medicare Other | Admitting: Pulmonary Disease

## 2023-02-18 VITALS — BP 128/72 | HR 78 | Resp 17 | Ht 72.0 in | Wt 239.6 lb

## 2023-02-18 DIAGNOSIS — I5042 Chronic combined systolic (congestive) and diastolic (congestive) heart failure: Secondary | ICD-10-CM

## 2023-02-18 DIAGNOSIS — G4733 Obstructive sleep apnea (adult) (pediatric): Secondary | ICD-10-CM | POA: Diagnosis not present

## 2023-02-18 NOTE — Assessment & Plan Note (Signed)
Continue torsemide.  We discussed weight-based dosing of diuretic

## 2023-02-18 NOTE — Assessment & Plan Note (Signed)
CPAP download was reviewed which shows excellent control of events on 15 cm.  He is very compliant more than 8 hours per night.  He has a large leak which has been present for a long time now.  This does not wake him up.  CPAP supplies will be renewed for a year.  CPAP is certainly helped improve his daytime somnolence and fatigue.  He would be eligible for a new machine  Weight loss encouraged, compliance with goal of at least 4-6 hrs every night is the expectation. Advised against medications with sedative side effects Cautioned against driving when sleepy - understanding that sleepiness will vary on a day to day basis

## 2023-02-18 NOTE — Patient Instructions (Addendum)
CPAP is working well on 15 cm. CPAP supplies with a renewed for a year

## 2023-02-18 NOTE — Progress Notes (Signed)
   Subjective:    Patient ID: Richard [name], male    DOB: 07-19-1940, 82 y.o.   MRN: [id number]  HPI  82 yo man For FU of obstructive sleep apnea.   He got a new CPAP machine in 10/2015   PMH -   HFrEF - 30% diffuse large B-cell lymphoma s/p chemotherapy (ennever) , in remission severe AS s/p TAVR  & PPM CHB  -left sided subclavian PPM placed 08/2017 Atrial Fibrillation/Flutter (eliquis, metoprolol), 08/2021 extraction of left axillary approach transvenous pacing system and Micra AV leadless pacemaker      Annual follow-up visit. He was hospitalized 2023 for acute on chronic diastolic heart failure and was diuresed 30 pounds.  He was then placed on torsemide and has done well since then.  He monitors his weight closely and takes an extra dose of torsemide if he gains weight. He is able to play golf 3 times a week. Chest x-ray 05/2022 shows bronchial thickening. His Port-A-Cath was removed.  He is cancer free. PPM is planned in October  He is compliant with CPAP machine. Denies any problems with mask or pressure  Significant tests/ events reviewed   2008 PSG RDI 48/h ONO on CPAP /RA 08/2014 >> no desatn - dc O2  Review of Systems neg for any significant sore throat, dysphagia, itching, sneezing, nasal congestion or excess/ purulent secretions, fever, chills, sweats, unintended wt loss, pleuritic or exertional cp, hempoptysis, orthopnea pnd or change in chronic leg swelling. Also denies presyncope, palpitations, heartburn, abdominal pain, nausea, vomiting, diarrhea or change in bowel or urinary habits, dysuria,hematuria, rash, arthralgias, visual complaints, headache, numbness weakness or ataxia.     Objective:   Physical Exam  Gen. Pleasant, elderly, obese, in no distress ENT - no lesions, no post nasal drip Neck: No JVD, no thyromegaly, no carotid bruits Lungs: no use of accessory muscles, no dullness to percussion, decreased without rales or rhonchi  Cardiovascular: Rhythm  regular, heart sounds  normal, no murmurs or gallops, 1+ peripheral edema Musculoskeletal: No deformities, no cyanosis or clubbing , no tremors, ecchymosis +       Assessment & Plan:

## 2023-02-21 DIAGNOSIS — M545 Low back pain, unspecified: Secondary | ICD-10-CM | POA: Diagnosis not present

## 2023-02-21 DIAGNOSIS — M791 Myalgia, unspecified site: Secondary | ICD-10-CM | POA: Diagnosis not present

## 2023-02-25 ENCOUNTER — Encounter (HOSPITAL_BASED_OUTPATIENT_CLINIC_OR_DEPARTMENT_OTHER): Payer: Medicare Other | Attending: General Surgery | Admitting: General Surgery

## 2023-02-25 DIAGNOSIS — Z951 Presence of aortocoronary bypass graft: Secondary | ICD-10-CM | POA: Insufficient documentation

## 2023-02-25 DIAGNOSIS — N1832 Chronic kidney disease, stage 3b: Secondary | ICD-10-CM | POA: Insufficient documentation

## 2023-02-25 DIAGNOSIS — I2510 Atherosclerotic heart disease of native coronary artery without angina pectoris: Secondary | ICD-10-CM | POA: Diagnosis not present

## 2023-02-25 DIAGNOSIS — I5042 Chronic combined systolic (congestive) and diastolic (congestive) heart failure: Secondary | ICD-10-CM | POA: Diagnosis not present

## 2023-02-25 DIAGNOSIS — Z794 Long term (current) use of insulin: Secondary | ICD-10-CM | POA: Diagnosis not present

## 2023-02-25 DIAGNOSIS — R600 Localized edema: Secondary | ICD-10-CM | POA: Insufficient documentation

## 2023-02-25 DIAGNOSIS — C8330 Diffuse large B-cell lymphoma, unspecified site: Secondary | ICD-10-CM | POA: Diagnosis not present

## 2023-02-25 DIAGNOSIS — S80211A Abrasion, right knee, initial encounter: Secondary | ICD-10-CM | POA: Diagnosis not present

## 2023-02-25 DIAGNOSIS — I130 Hypertensive heart and chronic kidney disease with heart failure and stage 1 through stage 4 chronic kidney disease, or unspecified chronic kidney disease: Secondary | ICD-10-CM | POA: Diagnosis not present

## 2023-02-25 DIAGNOSIS — E1122 Type 2 diabetes mellitus with diabetic chronic kidney disease: Secondary | ICD-10-CM | POA: Diagnosis not present

## 2023-02-25 DIAGNOSIS — S81801A Unspecified open wound, right lower leg, initial encounter: Secondary | ICD-10-CM | POA: Diagnosis not present

## 2023-02-25 DIAGNOSIS — L97522 Non-pressure chronic ulcer of other part of left foot with fat layer exposed: Secondary | ICD-10-CM | POA: Diagnosis not present

## 2023-02-25 DIAGNOSIS — E11622 Type 2 diabetes mellitus with other skin ulcer: Secondary | ICD-10-CM | POA: Insufficient documentation

## 2023-02-25 DIAGNOSIS — L97819 Non-pressure chronic ulcer of other part of right lower leg with unspecified severity: Secondary | ICD-10-CM | POA: Diagnosis not present

## 2023-02-25 DIAGNOSIS — E11621 Type 2 diabetes mellitus with foot ulcer: Secondary | ICD-10-CM | POA: Insufficient documentation

## 2023-02-25 NOTE — Progress Notes (Addendum)
Richard [name], Richard [name] ([id number]) [id number] Page 1 of 16 Visit Report for 02/25/2023 Chief Complaint Document Details Patient Name: Date of Service: Richard [name] Richard L. 02/25/2023 8:00 A M Medical Record Number: [id number] Patient Account Number: [id number] Date of Birth/Sex: Treating RN: 1941/05/31 (82 y.o. M) Primary Care Provider: Seabron Spates Other Clinician: Referring Provider: Treating Provider/Extender: Verner Mould in Treatment: 0 Information Obtained from: Patient Chief Complaint 01/12/2021; Wounds to his left upper extremity and bilateral lower extremities. 05/14/2022; bilateral lower extremity wounds, right plantar foot wound 09/05/2022; bilateral lower extremity wounds, left thumb wound 02/25/2023: left foot DFU and right knee and RLE wounds Electronic Signature(s) Signed: 02/25/2023 10:23:49 AM By: Duanne Guess MD FACS Previous Signature: 02/25/2023 8:34:10 AM Version By: Duanne Guess MD FACS Entered By: Duanne Guess on 02/25/2023 10:23:49 -------------------------------------------------------------------------------- Debridement Details Patient Name: Date of Service: Richard [name], Richard [name] Richard L. 02/25/2023 8:00 A M Medical Record Number: [id number] Patient Account Number: [id number] Date of Birth/Sex: Treating RN: 1941/03/14 (82 y.o. Richard [name] Primary Care Provider: Seabron Spates Other Clinician: Referring Provider: Treating Provider/Extender: Verner Mould in Treatment: 0 Debridement Performed for Assessment: Wound #15 Left,Medial Lower Leg Performed By: Physician Duanne Guess, MD Debridement Type: Debridement Level of Consciousness (Pre-procedure): Awake and [name] Pre-procedure Verification/Time Out Yes - 09:25 Taken: Start Time: 09:25 Pain Control: Lidocaine 5% topical ointment Percent of Wound Bed Debrided: 100% T Area Debrided (cm): otal 0.33 Tissue and  other material debrided: Viable, Non-Viable, Eschar, Slough, Skin: Dermis , Skin: Epidermis, Slough Level: Skin/Epidermis Debridement Description: Selective/Open Wound Instrument: Curette Bleeding: Minimum Hemostasis Achieved: Pressure End Time: 09:27 Procedural Pain: 0 Post Procedural Pain: 0 Response to Treatment: Procedure was tolerated well Level of Consciousness (Post- Awake and [name] procedure): Post Debridement Measurements of Total Wound Length: (cm) 0.6 Width: (cm) 0.7 Depth: (cm) 0.1 [name], [name] ([id number]) [id number] Page 2 of 16 Volume: (cm) 0.033 Character of Wound/Ulcer Post Debridement: Improved Post Procedure Diagnosis Same as Pre-procedure Notes Scribed for Dr. Lady Gary by J.Scotton Electronic Signature(s) Signed: 02/25/2023 10:51:55 AM By: Karie Schwalbe RN Signed: 02/25/2023 12:43:24 PM By: Duanne Guess MD FACS Entered By: Karie Schwalbe on 02/25/2023 09:33:29 -------------------------------------------------------------------------------- Debridement Details Patient Name: Date of Service: Richard [name], Richard [name] Richard L. 02/25/2023 8:00 A M Medical Record Number: [id number] Patient Account Number: [id number] Date of Birth/Sex: Treating RN: 1940-12-29 (82 y.o. Richard [name] Primary Care Provider: Seabron Spates Other Clinician: Referring Provider: Treating Provider/Extender: Verner Mould in Treatment: 0 Debridement Performed for Assessment: Wound #11 Left,Lateral,Plantar Foot Performed By: Physician Duanne Guess, MD Debridement Type: Debridement Severity of Tissue Pre Debridement: Fat layer exposed Level of Consciousness (Pre-procedure): Awake and [name] Pre-procedure Verification/Time Out Yes - 09:25 Taken: Start Time: 09:25 Pain Control: Lidocaine 5% topical ointment Percent of Wound Bed Debrided: 100% T Area Debrided (cm): otal 3.77 Tissue and other material debrided: Viable,  Non-Viable, Callus, Slough, Skin: Dermis , Skin: Epidermis, Slough Level: Skin/Epidermis Debridement Description: Selective/Open Wound Instrument: Curette Bleeding: Minimum Hemostasis Achieved: Pressure End Time: 09:27 Procedural Pain: 0 Post Procedural Pain: 0 Response to Treatment: Procedure was tolerated well Level of Consciousness (Post- Awake and [name] procedure): Post Debridement Measurements of Total Wound Length: (cm) 3 Width: (cm) 1.6 Depth: (cm) 0.1 Volume: (cm) 0.377 Character of Wound/Ulcer Post Debridement: Improved Severity of Tissue Post Debridement: Fat layer exposed Post Procedure Diagnosis Same as Pre-procedure Notes Scribed for Dr. Lady Gary by J.Scotton Electronic Signature(s) Signed: 02/25/2023 10:51:55 AM By: Karie Schwalbe RN Signed: 02/25/2023  12:43:24 PM By: Duanne Guess MD FACS Entered By: Karie Schwalbe on 02/25/2023 09:33:56 Richard [name] ([id number]) [id number] Page 3 of 16 -------------------------------------------------------------------------------- Debridement Details Patient Name: Date of Service: Richard [name] Richard L. 02/25/2023 8:00 A M Medical Record Number: [id number] Patient Account Number: [id number] Date of Birth/Sex: Treating RN: [birth year] (82 y.o. Richard [name] Primary Care Provider: Seabron Spates Other Clinician: Referring Provider: Treating Provider/Extender: Verner Mould in Treatment: 0 Debridement Performed for Assessment: Wound #12 Right Knee Performed By: Physician Duanne Guess, MD Debridement Type: Debridement Level of Consciousness (Pre-procedure): Awake and [name] Pre-procedure Verification/Time Out Yes - 09:25 Taken: Start Time: 09:25 Pain Control: Lidocaine 5% topical ointment Percent of Wound Bed Debrided: 100% T Area Debrided (cm): otal 0.47 Tissue and other material debrided: Viable, Non-Viable, Eschar, Slough, Skin: Dermis , Skin:  Epidermis, Slough Level: Skin/Epidermis Debridement Description: Selective/Open Wound Instrument: Curette Bleeding: Minimum Hemostasis Achieved: Pressure End Time: 09:27 Procedural Pain: 0 Post Procedural Pain: 0 Response to Treatment: Procedure was tolerated well Level of Consciousness (Post- Awake and [name] procedure): Post Debridement Measurements of Total Wound Length: (cm) 0.6 Width: (cm) 1 Depth: (cm) 0.1 Volume: (cm) 0.047 Character of Wound/Ulcer Post Debridement: Improved Post Procedure Diagnosis Same as Pre-procedure Notes Scribed for Dr. Lady Gary by J.Scotton Electronic Signature(s) Signed: 02/25/2023 10:51:55 AM By: Karie Schwalbe RN Signed: 02/25/2023 12:43:24 PM By: Duanne Guess MD FACS Entered By: Karie Schwalbe on 02/25/2023 09:35:38 -------------------------------------------------------------------------------- Debridement Details Patient Name: Date of Service: Richard [name], Richard [name] Richard L. 02/25/2023 8:00 A M Medical Record Number: [id number] Patient Account Number: [id number] Date of Birth/Sex: Treating RN: [birth year] (82 y.o. Richard [name] Primary Care Provider: Seabron Spates Other Clinician: Referring Provider: Treating Provider/Extender: Verner Mould in Treatment: 0 Debridement Performed for Assessment: Wound #13 Right,Proximal,Anterior Lower Leg Performed By: Physician Duanne Guess, MD Debridement Type: Debridement Richard [name], MARTIGNETTI ([id number]) [id number] Page 4 of 16 Level of Consciousness (Pre-procedure): Awake and [name] Pre-procedure Verification/Time Out Yes - 09:25 Taken: Start Time: 09:25 Pain Control: Lidocaine 5% topical ointment Percent of Wound Bed Debrided: 100% T Area Debrided (cm): otal 5.18 Tissue and other material debrided: Viable, Non-Viable, Eschar, Slough, Skin: Dermis , Skin: Epidermis, Slough Level: Skin/Epidermis Debridement Description: Selective/Open  Wound Instrument: Curette Bleeding: Minimum Hemostasis Achieved: Pressure End Time: 09:27 Procedural Pain: 0 Post Procedural Pain: 0 Response to Treatment: Procedure was tolerated well Level of Consciousness (Post- Awake and [name] procedure): Post Debridement Measurements of Total Wound Length: (cm) 3.3 Width: (cm) 2 Depth: (cm) 0.1 Volume: (cm) 0.518 Character of Wound/Ulcer Post Debridement: Improved Post Procedure Diagnosis Same as Pre-procedure Notes Scribed for Dr. Lady Gary by J.Scotton Electronic Signature(s) Signed: 02/25/2023 10:51:55 AM By: Karie Schwalbe RN Signed: 02/25/2023 12:43:24 PM By: Duanne Guess MD FACS Entered By: Karie Schwalbe on 02/25/2023 09:36:55 -------------------------------------------------------------------------------- Debridement Details Patient Name: Date of Service: Richard [name], Richard [name] Richard L. 02/25/2023 8:00 A M Medical Record Number: [id number] Patient Account Number: [id number] Date of Birth/Sex: Treating RN: [birth year] (82 y.o. Richard [name] Primary Care Provider: Seabron Spates Other Clinician: Referring Provider: Treating Provider/Extender: Verner Mould in Treatment: 0 Debridement Performed for Assessment: Wound #14 Right,Anterior Lower Leg Performed By: Physician Duanne Guess, MD Debridement Type: Debridement Level of Consciousness (Pre-procedure): Awake and [name] Pre-procedure Verification/Time Out Yes - 09:25 Taken: Start Time: 09:25 Pain Control: Lidocaine 5% topical ointment Percent of Wound Bed Debrided: 100% T Area Debrided (cm): otal 2.94 Tissue and other material debrided: Viable, Non-Viable, Eschar, Slough, Skin: Dermis ,  Skin: Epidermis, Slough Level: Skin/Epidermis Debridement Description: Selective/Open Wound Instrument: Curette Bleeding: Minimum Hemostasis Achieved: Pressure End Time: 09:27 Procedural Pain: 0 Post Procedural Pain: 0 Response to Treatment: Procedure was  tolerated well Level of Consciousness (Post- Awake and [name] procedure): Richard [name], Richard [name] ([id number]) [id number] Page 5 of 16 Post Debridement Measurements of Total Wound Length: (cm) 2.5 Width: (cm) 1.5 Depth: (cm) 0.1 Volume: (cm) 0.295 Character of Wound/Ulcer Post Debridement: Improved Post Procedure Diagnosis Same as Pre-procedure Notes Scribed for Dr. Lady Gary by J.Scotton Electronic Signature(s) Signed: 02/25/2023 10:51:55 AM By: Karie Schwalbe RN Signed: 02/25/2023 12:43:24 PM By: Duanne Guess MD FACS Entered By: Karie Schwalbe on 02/25/2023 09:37:37 -------------------------------------------------------------------------------- HPI Details Patient Name: Date of Service: Richard [name], [name] Richard L. 02/25/2023 8:00 A M Medical Record Number: [id number] Patient Account Number: [id number] Date of Birth/Sex: Treating RN: 1940/09/16 (82 y.o. M) Primary Care Provider: Seabron Spates Other Clinician: Referring Provider: Treating Provider/Extender: Verner Mould in Treatment: 0 History of Present Illness HPI Description: Admission 7/15 [name] is an 82 year old male with a past medical history of type 2 diabetes on oral agents, diffuse large B-cell lymphoma, and coronary artery disease status post CABG that presents to the clinic for wounds to his left upper extremity and bilateral lower extremities. He states that he went to urgent care to be evaluated for a fever. And he was instructed to go to the ED to be further evaluated for possible sepsis. Unfortunately he passed out in his car After his appointment and was not discovered for another 3 hours. He was eventually discovered and EMS had to take him out of his vehicle. This is when he developed abrasions to his left arm and legs. He has been using Xeroform to his wound beds every other day. He reports improvement to all the wounds except for the one on the back of  his right leg. He denies pain or signs of infection. 7/22; patient presents for 1 week follow-up. He has no issues or complaints today. He has tolerated the compression wrap well on his right lower extremity. He has been able to do the dressing changes without issues to his left leg and left arm. His left arm wound has healed. He denies signs of infection. 7/29; patient presents for 1 week follow-up. He reports that his left leg wound is healed. He tolerated the compression wrap well on his right leg. He has no issues or complaints today. He denies signs of infection. 8/4; patient presents for 1 week follow-up. He continues to see improvement in wound healing to his right lower extremity. He has no issues or complaints today. He denies signs of infection. He does state that he went to the dermatologist and had some lesions removed on his face and left leg. He has been keeping these covered with a Band-Aid. 8/19; patient presents for follow-up. He is tolerated the compression wrap well. He has no issues or complaints today. He now only has 1 remaining wound. 9/1; patient presents for 1 week follow-up. He has tolerated the compression wrap well and has no issues or complaints today. He has been golfing 3 times weekly. He is in good spirits today. 9/15; patient presents for follow-up. He has no issues or complaints today. The wound is closed. Readmission 05/14/2022 [name] is an 82 year old male with a past medical history of uncontrolled type 2 diabetes on oral agents, diffuse large B-cell lymphoma and coronary artery disease status post CABG that presents the clinic  for 3 wounds. He developed a right knee wound when he fell out of his golf cart 3 weeks ago. He developed increased warmth and erythema to the right leg. While he was still waiting in the ED on 04/24/2022 for this issue he dropped his phone and created a wound to his left thigh. He has been using Xeroform to these areas. He  ultimately required admission from the ED for right lower extremity cellulitis and was treated with Zyvox and Keflex. He also has a right plantar foot wound that started after having callus debridement by his podiatrist. He has had this wound for at least 6 weeks. For the foot wound he has not been keeping it covered and walks around barefoot. He has peripheral neuropathy However states that the wound site is painful. He denies any purulent drainage. He has swelling to this area. 11/21; patient presents for follow-up. He had an x-ray done at last clinic visit to the right foot that showed no focal bony erosion suggesting osteomyelitis. He completed his course of levofloxacin. He has been using his front offloading shoe. He has been using Xeroform to the knee wounds. He has no issues or complaints today. 11/28; patient presents for follow-up. His right knee wound has epithelialized. His right plantar foot wound remains closed. He has no issues or complaints today. 09/05/2022 [name] is an 82 year old male with a past medical history of type 2 diabetes on oral agents, diffuse large B-cell lymphoma and coronary artery Richard [name], Richard [name] ([id number]) [id number] Page 6 of 16 disease status post CABG that presents the clinic for a 2-week history of wounds to his lower extremities bilaterally and his left thumb. He developed the wounds to his legs after his sister's dog jumped on him. He has been using Vaseline to the areas. He cut his thumb with a knife while cooking. He states he went to the ED and a used Dermabond on this. He currently denies signs of infection. 3/14; patient presents for follow-up. He has been using Xeroform to his lower extremities bilaterally daily. He has been using antibiotic ointment to his left thumb. All wounds have shown improvement in healing. He has no issues or complaints today. 3/28; patient presents for follow-up. We have been using  antibiotic ointment to the left thumb and Xeroform to the knees bilaterally. His wounds have healed. READMISSION 02/25/2023 *** ABIs in clinic today-- right: 1.15 left: 1.12.*** He returns to clinic today with a new left foot ulcer. He says he has had a callus there for a while that the podiatrist has been periodically debriding, but it has become more painful. There is discoloration underneath a thick layer of callus suggestive of an open wound. He also struck his right leg on the car door and has multiple skin tears of varying sizes for a total of 4 wounds. Most recent hemoglobin A1c 7.1%. Electronic Signature(s) Signed: 02/25/2023 10:25:17 AM By: Duanne Guess MD FACS Previous Signature: 02/25/2023 8:34:29 AM Version By: Duanne Guess MD FACS Entered By: Duanne Guess on 02/25/2023 10:25:16 -------------------------------------------------------------------------------- Physical Exam Details Patient Name: Date of Service: Richard [name], Richard [name] Richard L. 02/25/2023 8:00 A M Medical Record Number: [id number] Patient Account Number: [id number] Date of Birth/Sex: Treating RN: [birth year] (82 y.o. M) Primary Care Provider: Seabron Spates Other Clinician: Referring Provider: Treating Provider/Extender: Verner Mould in Treatment: 0 Constitutional . . . . No acute distress. Respiratory Normal work of breathing on room air. Notes On the fifth metatarsal head on the  left, there is a thick callus with discoloration underneath. Once the callus was debrided, a linear wound was identified. No concern for infection. He has 4 wounds on his right lower leg consistent with the given history of trauma from his car door. There is some nonadherent to dead skin as well as slough and eschar on the wound surfaces. No concern for infection. Electronic Signature(s) Signed: 02/25/2023 10:26:31 AM By: Duanne Guess MD FACS Entered By: Duanne Guess on 02/25/2023  10:26:31 -------------------------------------------------------------------------------- Physician Orders Details Patient Name: Date of Service: Richard [name], Richard [name] Richard L. 02/25/2023 8:00 A M Medical Record Number: [id number] Patient Account Number: [id number] Date of Birth/Sex: Treating RN: 1941-01-13 (82 y.o. Richard [name] Primary Care Provider: Seabron Spates Other Clinician: Referring Provider: Treating Provider/Extender: Verner Mould in Treatment: 0 Verbal / Phone Orders: No Diagnosis Coding ICD-10 Coding Code Description [phone] Non-pressure chronic ulcer of other part of left foot with fat layer exposed ELMUS, MCELWEE ([id number]) [id number] Page 7 of 16 L97.819 Non-pressure chronic ulcer of other part of right lower leg with unspecified severity E11.621 Type 2 diabetes mellitus with foot ulcer E11.622 Type 2 diabetes mellitus with other skin ulcer I50.42 Chronic combined systolic (congestive) and diastolic (congestive) heart failure N18.32 Chronic kidney disease, stage 3b R60.0 Localized edema Follow-up Appointments ppointment in 1 week. - Dr. Lady Gary Room 3 Wednesday 03/05/23 at 8am Return A ppointment in 2 weeks. - Please ask front desk to schedule an appointment Return A Return appointment in 3 weeks. - Please ask front desk to schedule an appointment Return appointment in 1 month. - Please ask front desk to schedule an appointment Anesthetic (In clinic) Topical Lidocaine 5% applied to wound bed Bathing/ Shower/ Hygiene May shower and wash wound with soap and water. Edema Control - Lymphedema / SCD / Other Bilateral Lower Extremities Elevate legs to the level of the heart or above for 30 minutes daily and/or when sitting for 3-4 times a day throughout the day. Avoid standing for long periods of time. Exercise regularly - As T olerated Moisturize legs daily. Home Health Wound #11 Left,Lateral,Plantar  Foot Admit to Home Health for skilled nursing wound care. May utilize formulary equivalent dressing for wound treatment orders unless otherwise specified. Other Home Health Orders/Instructions: - Only the Left plantar foot dressing to be changed 2 x week by Home Health. The 3rd time in the week, the wound will be dressed at the [facility] Wound Treatment Wound #11 - Foot Wound Laterality: Plantar, Left, Lateral Cleanser: Soap and Water Every Other Day/30 Days Discharge Instructions: May shower and wash wound with dial antibacterial soap and water prior to dressing change. Cleanser: Vashe 5.8 (oz) Every Other Day/30 Days Discharge Instructions: Cleanse the wound with Vashe prior to applying a clean dressing using gauze sponges, not tissue or cotton balls. Cleanser: Wound Cleanser Every Other Day/30 Days Discharge Instructions: Cleanse the wound with wound cleanser prior to applying a clean dressing using gauze sponges, not tissue or cotton balls. Prim Dressing: Maxorb Extra Ag+ Alginate Dressing, 2x2 (in/in) Every Other Day/30 Days ary Discharge Instructions: Apply to wound bed as instructed Prim Dressing: Optifoam Non-Adhesive Dressing, 4x4 in Every Other Day/30 Days ary Discharge Instructions: Apply to wound bed as instructed Secondary Dressing: Woven Gauze Sponges 2x2 in Every Other Day/30 Days Discharge Instructions: Apply over primary dressing as directed. Secured With: Elastic Bandage 4 inch (ACE bandage) Every Other Day/30 Days Discharge Instructions: Secure with ACE bandage as directed. Secured With: [employer]  Sterile, 4.5x3.1 (in/yd) Every Other Day/30 Days Discharge Instructions: Secure with Kerlix as directed. Secured With: Transpore Surgical Tape, 2x10 (in/yd) Every Other Day/30 Days Discharge Instructions: Secure dressing with tape as directed. Wound #12 - Knee Wound Laterality: Right Cleanser: Soap and Water 1 x Per Day/30 Days Discharge Instructions: May shower and  wash wound with dial antibacterial soap and water prior to dressing change. Cleanser: Vashe 5.8 (oz) 1 x Per Day/30 Days Discharge Instructions: Cleanse the wound with Vashe prior to applying a clean dressing using gauze sponges, not tissue or cotton balls. Cleanser: Wound Cleanser 1 x Per Day/30 Days Discharge Instructions: Cleanse the wound with wound cleanser prior to applying a clean dressing using gauze sponges, not tissue or cotton balls. Prim Dressing: Maxorb Extra Ag+ Alginate Dressing, 2x2 (in/in) 1 x Per Day/30 Days ary Discharge Instructions: Apply to wound bed as instructed Secondary Dressing: Bordered Gauze, 2x2 in 1 x Per Day/30 Days HAWKIN, ZUMALT ([id number]) [id number] Page 8 of 16 Discharge Instructions: Apply over primary dressing as directed. Wound #13 - Lower Leg Wound Laterality: Right, Anterior, Proximal Cleanser: Soap and Water 1 x Per Week/30 Days Discharge Instructions: May shower and wash wound with dial antibacterial soap and water prior to dressing change. Cleanser: Vashe 5.8 (oz) 1 x Per Week/30 Days Discharge Instructions: Cleanse the wound with Vashe prior to applying a clean dressing using gauze sponges, not tissue or cotton balls. Cleanser: Wound Cleanser 1 x Per Week/30 Days Discharge Instructions: Cleanse the wound with wound cleanser prior to applying a clean dressing using gauze sponges, not tissue or cotton balls. Prim Dressing: Maxorb Extra Ag+ Alginate Dressing, 4x4.75 (in/in) 1 x Per Week/30 Days ary Discharge Instructions: Apply to wound bed as instructed Secondary Dressing: Woven Gauze Sponge, Non-Sterile 4x4 in 1 x Per Week/30 Days Discharge Instructions: Apply over primary dressing as directed. Compression Wrap: Urgo K2 Lite, (equivalent to a 3 layer) two layer compression system, regular 1 x Per Week/30 Days Discharge Instructions: Apply Urgo K2 Lite as directed (alternative to 3 layer compression). Compression Wrap:  Tubular netting #5 1 x Per Week/30 Days Wound #14 - Lower Leg Wound Laterality: Right, Anterior Cleanser: Soap and Water 1 x Per Week/30 Days Discharge Instructions: May shower and wash wound with dial antibacterial soap and water prior to dressing change. Cleanser: Vashe 5.8 (oz) 1 x Per Week/30 Days Discharge Instructions: Cleanse the wound with Vashe prior to applying a clean dressing using gauze sponges, not tissue or cotton balls. Cleanser: Wound Cleanser 1 x Per Week/30 Days Discharge Instructions: Cleanse the wound with wound cleanser prior to applying a clean dressing using gauze sponges, not tissue or cotton balls. Prim Dressing: Maxorb Extra Ag+ Alginate Dressing, 4x4.75 (in/in) 1 x Per Week/30 Days ary Discharge Instructions: Apply to wound bed as instructed Secondary Dressing: Woven Gauze Sponge, Non-Sterile 4x4 in 1 x Per Week/30 Days Discharge Instructions: Apply over primary dressing as directed. Compression Wrap: Urgo K2 Lite, (equivalent to a 3 layer) two layer compression system, regular 1 x Per Week/30 Days Discharge Instructions: Apply Urgo K2 Lite as directed (alternative to 3 layer compression). Compression Wrap: Tubular netting #5 1 x Per Week/30 Days Wound #15 - Lower Leg Wound Laterality: Right, Medial Cleanser: Soap and Water 1 x Per Week/30 Days Discharge Instructions: May shower and wash wound with dial antibacterial soap and water prior to dressing change. Cleanser: Vashe 5.8 (oz) 1 x Per Week/30 Days Discharge Instructions: Cleanse the wound with Vashe prior to applying a clean  dressing using gauze sponges, not tissue or cotton balls. Cleanser: Wound Cleanser 1 x Per Week/30 Days Discharge Instructions: Cleanse the wound with wound cleanser prior to applying a clean dressing using gauze sponges, not tissue or cotton balls. Prim Dressing: Maxorb Extra Ag+ Alginate Dressing, 4x4.75 (in/in) 1 x Per Week/30 Days ary Discharge Instructions: Apply to wound bed as  instructed Secondary Dressing: Woven Gauze Sponge, Non-Sterile 4x4 in 1 x Per Week/30 Days Discharge Instructions: Apply over primary dressing as directed. Compression Wrap: Urgo K2 Lite, (equivalent to a 3 layer) two layer compression system, regular 1 x Per Week/30 Days Discharge Instructions: Apply Urgo K2 Lite as directed (alternative to 3 layer compression). Compression Wrap: Tubular netting #5 1 x Per Week/30 Days Electronic Signature(s) Signed: 02/26/2023 3:38:16 PM By: Duanne Guess MD FACS Signed: 02/26/2023 5:10:10 PM By: Karie Schwalbe RN Previous Signature: 02/25/2023 12:43:24 PM Version By: Duanne Guess MD FACS Entered By: Karie Schwalbe on 02/26/2023 15:36:39 Richard [name], KOEGLER ([id number]) [id number] Page 9 of 16 -------------------------------------------------------------------------------- Problem List Details Patient Name: Date of Service: Richard [name] Richard L. 02/25/2023 8:00 A M Medical Record Number: [id number] Patient Account Number: [id number] Date of Birth/Sex: Treating RN: 1941-05-10 (82 y.o. M) Primary Care Provider: Seabron Spates Other Clinician: Referring Provider: Treating Provider/Extender: Verner Mould in Treatment: 0 Active Problems ICD-10 Encounter Code Description Active Date MDM Diagnosis L97.522 Non-pressure chronic ulcer of other part of left foot with fat layer exposed 02/25/2023 No Yes L97.812 Non-pressure chronic ulcer of other part of right lower leg with fat layer 02/25/2023 No Yes exposed E11.621 Type 2 diabetes mellitus with foot ulcer 02/25/2023 No Yes E11.622 Type 2 diabetes mellitus with other skin ulcer 02/25/2023 No Yes I50.42 Chronic combined systolic (congestive) and diastolic (congestive) heart failure 02/25/2023 No Yes N18.32 Chronic kidney disease, stage 3b 02/25/2023 No Yes R60.0 Localized edema 02/25/2023 No Yes Inactive Problems Resolved Problems Electronic  Signature(s) Signed: 02/25/2023 10:22:02 AM By: Duanne Guess MD FACS Previous Signature: 02/25/2023 8:33:26 AM Version By: Duanne Guess MD FACS Previous Signature: 02/25/2023 7:47:02 AM Version By: Duanne Guess MD FACS Entered By: Duanne Guess on 02/25/2023 10:22:01 -------------------------------------------------------------------------------- Progress Note Details Patient Name: Date of Service: Richard [name], Richard [name] Richard L. 02/25/2023 8:00 A M Medical Record Number: [id number] Patient Account Number: [id number] Date of Birth/Sex: Treating RN: June 01, 1941 (82 y.o. M) Primary Care Provider: Seabron Spates Other Clinician: Referring Provider: Treating Provider/Extender: Daniil, Boerman ([id number]) [id number] Page 10 of 16 Weeks in Treatment: 0 Subjective Chief Complaint Information obtained from Patient 01/12/2021; Wounds to his left upper extremity and bilateral lower extremities. 05/14/2022; bilateral lower extremity wounds, right plantar foot wound 09/05/2022; bilateral lower extremity wounds, left thumb wound 02/25/2023: left foot DFU and right knee and RLE wounds History of Present Illness (HPI) Admission 7/15 Mr. Yashua Hagge is an 82 year old male with a past medical history of type 2 diabetes on oral agents, diffuse large B-cell lymphoma, and coronary artery disease status post CABG that presents to the clinic for wounds to his left upper extremity and bilateral lower extremities. He states that he went to urgent care to be evaluated for a fever. And he was instructed to go to the ED to be further evaluated for possible sepsis. Unfortunately he passed out in his car After his appointment and was not discovered for another 3 hours. He was eventually discovered and EMS had to take him out of his vehicle. This is when he developed abrasions to his left  arm and legs. He has been using Xeroform to his wound beds every  other day. He reports improvement to all the wounds except for the one on the back of his right leg. He denies pain or signs of infection. 7/22; patient presents for 1 week follow-up. He has no issues or complaints today. He has tolerated the compression wrap well on his right lower extremity. He has been able to do the dressing changes without issues to his left leg and left arm. His left arm wound has healed. He denies signs of infection. 7/29; patient presents for 1 week follow-up. He reports that his left leg wound is healed. He tolerated the compression wrap well on his right leg. He has no issues or complaints today. He denies signs of infection. 8/4; patient presents for 1 week follow-up. He continues to see improvement in wound healing to his right lower extremity. He has no issues or complaints today. He denies signs of infection. He does state that he went to the dermatologist and had some lesions removed on his face and left leg. He has been keeping these covered with a Band-Aid. 8/19; patient presents for follow-up. He is tolerated the compression wrap well. He has no issues or complaints today. He now only has 1 remaining wound. 9/1; patient presents for 1 week follow-up. He has tolerated the compression wrap well and has no issues or complaints today. He has been golfing 3 times weekly. He is in good spirits today. 9/15; patient presents for follow-up. He has no issues or complaints today. The wound is closed. Readmission 05/14/2022 [name] is an 82 year old male with a past medical history of uncontrolled type 2 diabetes on oral agents, diffuse large B-cell lymphoma and coronary artery disease status post CABG that presents the clinic for 3 wounds. He developed a right knee wound when he fell out of his golf cart 3 weeks ago. He developed increased warmth and erythema to the right leg. While he was still waiting in the ED on 04/24/2022 for this issue he dropped his phone and  created a wound to his left thigh. He has been using Xeroform to these areas. He ultimately required admission from the ED for right lower extremity cellulitis and was treated with Zyvox and Keflex. He also has a right plantar foot wound that started after having callus debridement by his podiatrist. He has had this wound for at least 6 weeks. For the foot wound he has not been keeping it covered and walks around barefoot. He has peripheral neuropathy However states that the wound site is painful. He denies any purulent drainage. He has swelling to this area. 11/21; patient presents for follow-up. He had an x-ray done at last clinic visit to the right foot that showed no focal bony erosion suggesting osteomyelitis. He completed his course of levofloxacin. He has been using his front offloading shoe. He has been using Xeroform to the knee wounds. He has no issues or complaints today. 11/28; patient presents for follow-up. His right knee wound has epithelialized. His right plantar foot wound remains closed. He has no issues or complaints today. 09/05/2022 [name] is an 82 year old male with a past medical history of type 2 diabetes on oral agents, diffuse large B-cell lymphoma and coronary artery disease status post CABG that presents the clinic for a 2-week history of wounds to his lower extremities bilaterally and his left thumb. He developed the wounds to his legs after his sister's dog jumped on  him. He has been using Vaseline to the areas. He cut his thumb with a knife while cooking. He states he went to the ED and a used Dermabond on this. He currently denies signs of infection. 3/14; patient presents for follow-up. He has been using Xeroform to his lower extremities bilaterally daily. He has been using antibiotic ointment to his left thumb. All wounds have shown improvement in healing. He has no issues or complaints today. 3/28; patient presents for follow-up. We have been using  antibiotic ointment to the left thumb and Xeroform to the knees bilaterally. His wounds have healed. READMISSION 02/25/2023 *** ABIs in clinic today-- right: 1.15 left: 1.12.*** He returns to clinic today with a new left foot ulcer. He says he has had a callus there for a while that the podiatrist has been periodically debriding, but it has become more painful. There is discoloration underneath a thick layer of callus suggestive of an open wound. He also struck his right leg on the car door and has multiple skin tears of varying sizes for a total of 4 wounds. Most recent hemoglobin A1c 7.1%. Patient History Information obtained from Patient. Allergies benazepril, hydrochlorothiazide, aspirin, lactose, ACE Inhibitors (Reaction: swelling), Entresto (Reaction: swelling) Family History Cancer - Mother, Diabetes - Maternal Grandparents, Heart Disease - Paternal Grandparents, Hypertension - Paternal Grandparents, Seizures - Child, Stroke - Father, No family history of Hereditary Spherocytosis, Kidney Disease, Lung Disease, Thyroid Problems, Tuberculosis. Social History Never smoker, Marital Status - Divorced, Alcohol Use - Never, Drug Use - No History, Caffeine Use - Rarely. Medical History Eyes Patient has history of Cataracts - Surgery Hematologic/Lymphatic Patient has history of Anemia Richard [name], Richard [name] ([id number]) [id number] Page 11 of 16 Respiratory Patient has history of Sleep Apnea Cardiovascular Patient has history of Arrhythmia, Congestive Heart Failure, Coronary Artery Disease, Hypertension Endocrine Patient has history of Type II Diabetes Musculoskeletal Patient has history of Osteoarthritis Neurologic Patient has history of Neuropathy Oncologic Patient has history of Received Chemotherapy Denies history of Received Radiation Patient is treated with Oral Agents. Blood sugar is tested. Blood sugar results noted at the following times: Breakfast -  202. Hospitalization/Surgery History - cellulitis right leg 10/25-10/28/2023. - balloon angioplasty 03/2021. - 12/29/2021 CHF. Medical A Surgical History Notes nd Musculoskeletal Spinal Stenosis Oncologic 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 Objective Constitutional No acute distress. Vitals Time Taken: 8:12 AM, Height: 72 in, Weight: 230 lbs, BMI: 31.2, Temperature: 97.7 F, Pulse: 73 bpm, Respiratory Rate: 18 breaths/min, Blood Pressure: 111/71 mmHg, Capillary Blood Glucose: 202 mg/dl. Respiratory Normal work of breathing on room air. General Notes: On the fifth metatarsal head on the left, there is a thick callus with discoloration underneath. Once the callus was debrided, a linear wound was identified. No concern for infection. He has 4 wounds on his right lower leg consistent with the given history of trauma from his car door. There is some nonadherent to dead skin as well as slough and eschar on the wound surfaces. No concern for infection. Integumentary (Hair, Skin) Wound #11 status is Open. Original cause of wound was Gradually Appeared. The date acquired was: 02/24/2022. The wound is located on the Left,Lateral,Plantar Foot. The wound measures 3cm length x 1.6cm width x 0.1cm depth; 3.77cm^2 area and 0.377cm^3 volume. There is Fat Layer (Subcutaneous Tissue) exposed. There is a none present amount of drainage noted. There is small (1-33%) red granulation within the wound bed. There is a large (67-100%) amount of necrotic tissue within the wound  bed including Eschar. The periwound skin appearance had no abnormalities noted for moisture. The periwound skin appearance had no abnormalities noted for color. The periwound skin appearance exhibited: Callus. Periwound temperature was noted as No Abnormality. Wound #12 status is Open. Original cause of wound was Trauma. The date acquired was: 02/05/2023. The wound is located on the Right Knee. The wound measures 0.6cm length x 1cm  width x 0.1cm depth; 0.471cm^2 area and 0.047cm^3 volume. There is Fat Layer (Subcutaneous Tissue) exposed. There is no tunneling or undermining noted. There is a medium amount of serosanguineous drainage noted. There is small (1-33%) red granulation within the wound bed. There is a large (67-100%) amount of necrotic tissue within the wound bed including Eschar and Adherent Slough. The periwound skin appearance had no abnormalities noted for moisture. The periwound skin appearance had no abnormalities noted for color. The periwound skin appearance exhibited: Scarring. Periwound temperature was noted as No Abnormality. Wound #13 status is Open. Original cause of wound was Skin T ear/Laceration. The date acquired was: 02/05/2023. The wound is located on the Right,Proximal,Anterior Lower Leg. The wound measures 3.3cm length x 2cm width x 0.1cm depth; 5.184cm^2 area and 0.518cm^3 volume. There is Fat Layer (Subcutaneous Tissue) exposed. There is no tunneling or undermining noted. There is a medium amount of serosanguineous drainage noted. There is medium (34-66%) red granulation within the wound bed. There is a medium (34-66%) amount of necrotic tissue within the wound bed including Eschar and Adherent Slough. The periwound skin appearance had no abnormalities noted for moisture. The periwound skin appearance had no abnormalities noted for color. The periwound skin appearance exhibited: Scarring. Periwound temperature was noted as No Abnormality. Wound #14 status is Open. Original cause of wound was Skin T ear/Laceration. The date acquired was: 02/05/2023. The wound is located on the Right,Anterior Lower Leg. The wound measures 2.5cm length x 1.5cm width x 0.1cm depth; 2.945cm^2 area and 0.295cm^3 volume. There is Fat Layer (Subcutaneous Tissue) exposed. There is no tunneling or undermining noted. There is a medium amount of serosanguineous drainage noted. There is small (1-33%) red granulation within the  wound bed. There is a large (67-100%) amount of necrotic tissue within the wound bed including Eschar. The periwound skin appearance had no abnormalities noted for moisture. The periwound skin appearance had no abnormalities noted for color. The periwound skin appearance exhibited: Scarring. Periwound temperature was noted as No Abnormality. Wound #15 status is Open. Original cause of wound was Skin T ear/Laceration. The date acquired was: 02/05/2023. The wound is located on the Right,Medial Lower Leg. The wound measures 0.6cm length x 0.7cm width x 0.1cm depth; 0.33cm^2 area and 0.033cm^3 volume. There is Fat Layer (Subcutaneous Tissue) exposed. There is no tunneling or undermining noted. There is a medium amount of serosanguineous drainage noted. There is medium (34-66%) red granulation within the wound bed. There is a medium (34-66%) amount of necrotic tissue within the wound bed including Eschar. The periwound skin appearance had no abnormalities noted for texture. The periwound skin appearance had no abnormalities noted for moisture. The periwound skin appearance had no abnormalities noted for color. Periwound temperature was noted as No Abnormality. Assessment KAHRON, BEETS ([id number]) [id number] Page 12 of 16 Active Problems ICD-10 Non-pressure chronic ulcer of other part of left foot with fat layer exposed Non-pressure chronic ulcer of other part of right lower leg with fat layer exposed Type 2 diabetes mellitus with foot ulcer Type 2 diabetes mellitus with other skin ulcer Chronic combined systolic (congestive)  and diastolic (congestive) heart failure Chronic kidney disease, stage 3b Localized edema Procedures Wound #11 Pre-procedure diagnosis of Wound #11 is a Diabetic Wound/Ulcer of the Lower Extremity located on the Left,Lateral,Plantar Foot .Severity of Tissue Pre Debridement is: Fat layer exposed. There was a Selective/Open Wound Skin/Epidermis  Debridement with a total area of 3.77 sq cm performed by Duanne Guess, MD. With the following instrument(s): Curette to remove Viable and Non-Viable tissue/material. Material removed includes Callus, Slough, Skin: Dermis, and Skin: Epidermis after achieving pain control using Lidocaine 5% topical ointment. No specimens were taken. A time out was conducted at 09:25, prior to the start of the procedure. A Minimum amount of bleeding was controlled with Pressure. The procedure was tolerated well with a pain level of 0 throughout and a pain level of 0 following the procedure. Post Debridement Measurements: 3cm length x 1.6cm width x 0.1cm depth; 0.377cm^3 volume. Character of Wound/Ulcer Post Debridement is improved. Severity of Tissue Post Debridement is: Fat layer exposed. Post procedure Diagnosis Wound #11: Same as Pre-Procedure General Notes: Scribed for Dr. Lady Gary by J.Scotton. Wound #12 Pre-procedure diagnosis of Wound #12 is an Abrasion located on the Right Knee . There was a Selective/Open Wound Skin/Epidermis Debridement with a total area of 0.47 sq cm performed by Duanne Guess, MD. With the following instrument(s): Curette to remove Viable and Non-Viable tissue/material. Material removed includes Eschar, Slough, Skin: Dermis, and Skin: Epidermis after achieving pain control using Lidocaine 5% topical ointment. No specimens were taken. A time out was conducted at 09:25, prior to the start of the procedure. A Minimum amount of bleeding was controlled with Pressure. The procedure was tolerated well with a pain level of 0 throughout and a pain level of 0 following the procedure. Post Debridement Measurements: 0.6cm length x 1cm width x 0.1cm depth; 0.047cm^3 volume. Character of Wound/Ulcer Post Debridement is improved. Post procedure Diagnosis Wound #12: Same as Pre-Procedure General Notes: Scribed for Dr. Lady Gary by J.Scotton. Wound #13 Pre-procedure diagnosis of Wound #13 is a Trauma,  Other located on the Right,Proximal,Anterior Lower Leg . There was a Selective/Open Wound Skin/Epidermis Debridement with a total area of 5.18 sq cm performed by Duanne Guess, MD. With the following instrument(s): Curette to remove Viable and Non-Viable tissue/material. Material removed includes Eschar, Slough, Skin: Dermis, and Skin: Epidermis after achieving pain control using Lidocaine 5% topical ointment. No specimens were taken. A time out was conducted at 09:25, prior to the start of the procedure. A Minimum amount of bleeding was controlled with Pressure. The procedure was tolerated well with a pain level of 0 throughout and a pain level of 0 following the procedure. Post Debridement Measurements: 3.3cm length x 2cm width x 0.1cm depth; 0.518cm^3 volume. Character of Wound/Ulcer Post Debridement is improved. Post procedure Diagnosis Wound #13: Same as Pre-Procedure General Notes: Scribed for Dr. Lady Gary by J.Scotton. Pre-procedure diagnosis of Wound #13 is a Trauma, Other located on the Right,Proximal,Anterior Lower Leg . There was a Three Layer Compression Therapy Procedure by Karie Schwalbe, RN. Post procedure Diagnosis Wound #13: Same as Pre-Procedure Wound #14 Pre-procedure diagnosis of Wound #14 is a Trauma, Other located on the Right,Anterior Lower Leg . There was a Selective/Open Wound Skin/Epidermis Debridement with a total area of 2.94 sq cm performed by Duanne Guess, MD. With the following instrument(s): Curette to remove Viable and Non-Viable tissue/material. Material removed includes Eschar, Slough, Skin: Dermis, and Skin: Epidermis after achieving pain control using Lidocaine 5% topical ointment. No specimens were taken. A time out  was conducted at 09:25, prior to the start of the procedure. A Minimum amount of bleeding was controlled with Pressure. The procedure was tolerated well with a pain level of 0 throughout and a pain level of 0 following the procedure. Post  Debridement Measurements: 2.5cm length x 1.5cm width x 0.1cm depth; 0.295cm^3 volume. Character of Wound/Ulcer Post Debridement is improved. Post procedure Diagnosis Wound #14: Same as Pre-Procedure General Notes: Scribed for Dr. Lady Gary by J.Scotton. Pre-procedure diagnosis of Wound #14 is a Trauma, Other located on the Right,Anterior Lower Leg . There was a Three Layer Compression Therapy Procedure by Karie Schwalbe, RN. Post procedure Diagnosis Wound #14: Same as Pre-Procedure Wound #15 Pre-procedure diagnosis of Wound #15 is a Trauma, Other located on the Left,Medial Lower Leg . There was a Selective/Open Wound Skin/Epidermis Debridement with a total area of 0.33 sq cm performed by Duanne Guess, MD. With the following instrument(s): Curette to remove Viable and Non-Viable tissue/material. Material removed includes Eschar, Slough, Skin: Dermis, and Skin: Epidermis after achieving pain control using Lidocaine 5% topical ointment. No specimens were taken. A time out was conducted at 09:25, prior to the start of the procedure. A Minimum amount of bleeding was controlled with Pressure. The procedure was tolerated well with a pain level of 0 throughout and a pain level of 0 following the procedure. Post Debridement Measurements: 0.6cm length x 0.7cm width x 0.1cm depth; 0.033cm^3 volume. Character of Wound/Ulcer Post Debridement is improved. Post procedure Diagnosis Wound #15: Same as Pre-Procedure General Notes: Scribed for Dr. Lady Gary by J.Scotton. Pre-procedure diagnosis of Wound #15 is a Trauma, Other located on the Right,Medial Lower Leg . There was a Three Layer Compression Therapy Procedure by Karie Schwalbe, RN. Post procedure Diagnosis Wound #15: Same as Pre-Procedure Richard [name], Richard [name] ([id number]) [id number] Page 13 of 16 Plan Follow-up Appointments: Return Appointment in 1 week. - Dr. Lady Gary Room 3 Wednesday 03/05/23 at 8am Return Appointment in 2 weeks.  - Please ask front desk to schedule an appointment Return appointment in 3 weeks. - Please ask front desk to schedule an appointment Return appointment in 1 month. - Please ask front desk to schedule an appointment Anesthetic: (In clinic) Topical Lidocaine 5% applied to wound bed Bathing/ Shower/ Hygiene: May shower and wash wound with soap and water. Edema Control - Lymphedema / SCD / Other: Elevate legs to the level of the heart or above for 30 minutes daily and/or when sitting for 3-4 times a day throughout the day. Avoid standing for long periods of time. Exercise regularly - As Tolerated Moisturize legs daily. Home Health: Wound #11 Left,Lateral,Plantar Foot: Admit to Home Health for skilled nursing wound care. May utilize formulary equivalent dressing for wound treatment orders unless otherwise specified. Other Home Health Orders/Instructions: - Only the Left plantar foot dressing to be changed 2 x week by Home Health. The 3rd time in the week, the wound will be dressed at the Wound Care Center WOUND #11: - Foot Wound Laterality: Plantar, Left, Lateral Cleanser: Soap and Water Every Other Day/30 Days Discharge Instructions: May shower and wash wound with dial antibacterial soap and water prior to dressing change. Cleanser: Vashe 5.8 (oz) Every Other Day/30 Days Discharge Instructions: Cleanse the wound with Vashe prior to applying a clean dressing using gauze sponges, not tissue or cotton balls. Cleanser: Wound Cleanser Every Other Day/30 Days Discharge Instructions: Cleanse the wound with wound cleanser prior to applying a clean dressing using gauze sponges, not tissue or cotton balls. Prim Dressing: Maxorb Extra  Ag+ Alginate Dressing, 2x2 (in/in) Every Other Day/30 Days ary Discharge Instructions: Apply to wound bed as instructed Prim Dressing: Optifoam Non-Adhesive Dressing, 4x4 in Every Other Day/30 Days ary Discharge Instructions: Apply to wound bed as instructed Secondary  Dressing: Woven Gauze Sponges 2x2 in Every Other Day/30 Days Discharge Instructions: Apply over primary dressing as directed. Secured With: Elastic Bandage 4 inch (ACE bandage) Every Other Day/30 Days Discharge Instructions: Secure with ACE bandage as directed. Secured With: [employer], 4.5x3.1 (in/yd) Every Other Day/30 Days Discharge Instructions: Secure with Kerlix as directed. Secured With: [employer] Surgical T ape, 2x10 (in/yd) Every Other Day/30 Days Discharge Instructions: Secure dressing with tape as directed. WOUND #12: - Knee Wound Laterality: Right Cleanser: Soap and Water 1 x Per Day/30 Days Discharge Instructions: May shower and wash wound with dial antibacterial soap and water prior to dressing change. Cleanser: Vashe 5.8 (oz) 1 x Per Day/30 Days Discharge Instructions: Cleanse the wound with Vashe prior to applying a clean dressing using gauze sponges, not tissue or cotton balls. Cleanser: Wound Cleanser 1 x Per Day/30 Days Discharge Instructions: Cleanse the wound with wound cleanser prior to applying a clean dressing using gauze sponges, not tissue or cotton balls. Prim Dressing: Maxorb Extra Ag+ Alginate Dressing, 2x2 (in/in) 1 x Per Day/30 Days ary Discharge Instructions: Apply to wound bed as instructed Secondary Dressing: Bordered Gauze, 2x2 in 1 x Per Day/30 Days Discharge Instructions: Apply over primary dressing as directed. WOUND #13: - Lower Leg Wound Laterality: Right, Anterior, Proximal Cleanser: Soap and Water 1 x Per Week/30 Days Discharge Instructions: May shower and wash wound with dial antibacterial soap and water prior to dressing change. Cleanser: Vashe 5.8 (oz) 1 x Per Week/30 Days Discharge Instructions: Cleanse the wound with Vashe prior to applying a clean dressing using gauze sponges, not tissue or cotton balls. Cleanser: Wound Cleanser 1 x Per Week/30 Days Discharge Instructions: Cleanse the wound with wound cleanser prior to applying a  clean dressing using gauze sponges, not tissue or cotton balls. Prim Dressing: Maxorb Extra Ag+ Alginate Dressing, 4x4.75 (in/in) 1 x Per Week/30 Days ary Discharge Instructions: Apply to wound bed as instructed Secondary Dressing: Woven Gauze Sponge, Non-Sterile 4x4 in 1 x Per Week/30 Days Discharge Instructions: Apply over primary dressing as directed. Com pression Wrap: Urgo K2 Lite, (equivalent to a 3 layer) two layer compression system, regular 1 x Per Week/30 Days Discharge Instructions: Apply Urgo K2 Lite as directed (alternative to 3 layer compression). Com pression Wrap: Tubular netting #5 1 x Per Week/30 Days WOUND #14: - Lower Leg Wound Laterality: Right, Anterior Cleanser: Soap and Water 1 x Per Week/30 Days Discharge Instructions: May shower and wash wound with dial antibacterial soap and water prior to dressing change. Cleanser: Vashe 5.8 (oz) 1 x Per Week/30 Days Discharge Instructions: Cleanse the wound with Vashe prior to applying a clean dressing using gauze sponges, not tissue or cotton balls. Cleanser: Wound Cleanser 1 x Per Week/30 Days Discharge Instructions: Cleanse the wound with wound cleanser prior to applying a clean dressing using gauze sponges, not tissue or cotton balls. Prim Dressing: Maxorb Extra Ag+ Alginate Dressing, 4x4.75 (in/in) 1 x Per Week/30 Days ary Discharge Instructions: Apply to wound bed as instructed Secondary Dressing: Woven Gauze Sponge, Non-Sterile 4x4 in 1 x Per Week/30 Days Discharge Instructions: Apply over primary dressing as directed. Com pression Wrap: Urgo K2 Lite, (equivalent to a 3 layer) two layer compression system, regular 1 x Per Week/30 Days Discharge Instructions: Apply  Urgo K2 Lite as directed (alternative to 3 layer compression). Com pression Wrap: Tubular netting #5 1 x Per Week/30 Days WOUND #15: - Lower Leg Wound Laterality: Right, Medial Cleanser: Soap and Water 1 x Per Week/30 Days Discharge Instructions: May shower and  wash wound with dial antibacterial soap and water prior to dressing change. Cleanser: Vashe 5.8 (oz) 1 x Per Week/30 Days Discharge Instructions: Cleanse the wound with Vashe prior to applying a clean dressing using gauze sponges, not tissue or cotton balls. Cleanser: Wound Cleanser 1 x Per Week/30 Days Discharge Instructions: Cleanse the wound with wound cleanser prior to applying a clean dressing using gauze sponges, not tissue or cotton balls. Prim Dressing: Maxorb Extra Ag+ Alginate Dressing, 4x4.75 (in/in) 1 x Per Week/30 Days ary Discharge Instructions: Apply to wound bed as instructed Secondary Dressing: Woven Gauze Sponge, Non-Sterile 4x4 in 1 x Per Week/30 Days Richard [name], Richard [name] ([id number]) [id number] Page 14 of 16 Discharge Instructions: Apply over primary dressing as directed. Compression Wrap: Urgo K2 Lite, (equivalent to a 3 layer) two layer compression system, regular 1 x Per Week/30 Days Discharge Instructions: Apply Urgo K2 Lite as directed (alternative to 3 layer compression). Compression Wrap: Tubular netting #5 1 x Per Week/30 Days 02/25/2023: The patient returns with multiple new wounds. On the fifth metatarsal head on the left, there is a thick callus with discoloration underneath. Once the callus was debrided, a linear wound was identified. No concern for infection. He has 4 wounds on his right lower leg consistent with the given history of trauma from his car door. There is some nonadherent to dead skin as well as slough and eschar on the wound surfaces. No concern for infection. I used a curette to debride callus, skin, and slough from the left foot ulcer. I debrided slough, skin, and eschar from all of the wounds on his left leg. We will apply silver alginate and an Urgo lite wrap on the right leg. We will also use silver alginate and a foam border dressing on his knee, which he will be able to change himself. We are also going to use silver  alginate on his foot ulcer along with a foam donut for offloading Kerlix and Ace wrap. He is unable to see this wound so we will work on getting home health assistance to have the dressing changed every other day. Follow-up in 1 week. Electronic Signature(s) Signed: 02/26/2023 4:11:48 PM By: Duanne Guess MD FACS Signed: 02/26/2023 5:10:10 PM By: Karie Schwalbe RN Previous Signature: 02/25/2023 10:30:40 AM Version By: Duanne Guess MD FACS Entered By: Karie Schwalbe on 02/26/2023 15:56:23 -------------------------------------------------------------------------------- HxROS Details Patient Name: Date of Service: Richard [name], [name] Richard L. 02/25/2023 8:00 A M Medical Record Number: [id number] Patient Account Number: [id number] Date of Birth/Sex: Treating RN: July 14, 1940 (82 y.o. Richard [name] Primary Care Provider: Seabron Spates Other Clinician: Referring Provider: Treating Provider/Extender: Verner Mould in Treatment: 0 Information Obtained From Patient Eyes Medical History: Positive for: Cataracts - Surgery Hematologic/Lymphatic Medical History: Positive for: Anemia Respiratory Medical History: Positive for: Sleep Apnea Cardiovascular Medical History: Positive for: Arrhythmia; Congestive Heart Failure; Coronary Artery Disease; Hypertension Endocrine Medical History: Positive for: Type II Diabetes Time with diabetes: 21 years Treated with: Oral agents Blood sugar tested every day: Yes Tested : Q am Blood sugar testing results: Breakfast: 202 Musculoskeletal Medical History: Positive for: Osteoarthritis Past Medical History NotesMarland Kitchen HEYWOOD, DABISH ([id number]) [id number] Page 15 of 16 Spinal Stenosis Neurologic Medical History: Positive for: Neuropathy  Oncologic Medical History: Positive for: Received Chemotherapy Negative for: Received Radiation Past Medical History Notes: 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 HBO Extended History Items Eyes: Cataracts Immunizations Pneumococcal Vaccine: Received Pneumococcal Vaccination: Yes Received Pneumococcal Vaccination On or After 60th Birthday: No Implantable Devices Yes Hospitalization / Surgery History Type of Hospitalization/Surgery cellulitis right leg 10/25-10/28/2023 balloon angioplasty 03/2021 12/29/2021 CHF Family and Social History Cancer: Yes - Mother; Diabetes: Yes - Maternal Grandparents; Heart Disease: Yes - Paternal Grandparents; Hereditary Spherocytosis: No; Hypertension: Yes - Paternal Grandparents; Kidney Disease: No; Lung Disease: No; Seizures: Yes - Child; Stroke: Yes - Father; Thyroid Problems: No; Tuberculosis: No; Never smoker; Marital Status - Divorced; Alcohol Use: Never; Drug Use: No History; Caffeine Use: Rarely; Financial Concerns: No; Food, Clothing or Shelter Needs: No; Support System Lacking: No; Transportation Concerns: No Electronic Signature(s) Signed: 02/25/2023 10:51:55 AM By: Karie Schwalbe RN Signed: 02/25/2023 12:43:24 PM By: Duanne Guess MD FACS Entered By: Karie Schwalbe on 02/25/2023 08:27:05 -------------------------------------------------------------------------------- SuperBill Details Patient Name: Date of Service: Richard [name] Richard L. 02/25/2023 Medical Record Number: [id number] Patient Account Number: [id number] Date of Birth/Sex: Treating RN: Oct 20, 1940 (82 y.o. Richard [name] Primary Care Provider: Seabron Spates Other Clinician: Referring Provider: Treating Provider/Extender: Verner Mould in Treatment: 0 Diagnosis Coding ICD-10 Codes Code Description 7407160115 Non-pressure chronic ulcer of other part of left foot with fat layer exposed L97.819 Non-pressure chronic ulcer of other part of right lower leg with unspecified severity E11.621 Type 2 diabetes mellitus with foot ulcer E11.622 Type 2 diabetes mellitus with other skin  ulcer I50.42 Chronic combined systolic (congestive) and diastolic (congestive) heart failure N18.32 Chronic kidney disease, stage 3b TAJOHN, TALBERT ([id number]) [id number] Page 16 of 16 R60.0 Localized edema Facility Procedures : CPT4 Code: 21308657 Description: 99214 - WOUND CARE VISIT-LEV 4 EST PT Modifier: Quantity: 1 : CPT4 Code: 84696295 Description: 97597 - DEBRIDE WOUND 1ST 20 SQ CM OR < ICD-10 Diagnosis Description L97.522 Non-pressure chronic ulcer of other part of left foot with fat layer exposed L97.819 Non-pressure chronic ulcer of other part of right lower leg with unspecified  seve Modifier: rity Quantity: 1 Physician Procedures : CPT4 Code Description Modifier [id number] 99214 - WC PHYS LEVEL 4 - EST PT 25 ICD-10 Diagnosis Description L97.522 Non-pressure chronic ulcer of other part of left foot with fat layer exposed L97.819 Non-pressure chronic ulcer of other part of right lower  leg with unspecified severity E11.621 Type 2 diabetes mellitus with foot ulcer E11.622 Type 2 diabetes mellitus with other skin ulcer Quantity: 1 : [id number] 97597 - WC PHYS DEBR WO ANESTH 20 SQ CM ICD-10 Diagnosis Description L97.522 Non-pressure chronic ulcer of other part of left foot with fat layer exposed L97.819 Non-pressure chronic ulcer of other part of right lower leg with unspecified  severity Quantity: 1 Electronic Signature(s) Signed: 02/25/2023 10:31:07 AM By: Duanne Guess MD FACS Entered By: Duanne Guess on 02/25/2023 10:31:07

## 2023-02-25 NOTE — Progress Notes (Signed)
BRYCETON, YANO ([id number]) [id number] Page 1 of 4 Visit Report for 02/25/2023 Abuse Risk Screen Details Patient Name: Date of Service: Richard Alert [name] 02/25/2023 8:00 A M Medical Record Number: [id number] Patient Account Number: [id number] Date of Birth/Sex: Treating RN: 07-09-1940 (82 y.o. Dianna Limbo Primary Care Maisa Bedingfield: Seabron Spates Other Clinician: Referring Jeweldean Drohan: Treating Dayron Odland/Extender: Verner Mould in Treatment: 0 Abuse Risk Screen Items Answer ABUSE RISK SCREEN: Has anyone close to you tried to hurt or harm you recentlyo No Do you feel uncomfortable with anyone in your familyo No Has anyone forced you do things that you didnt want to doo No Electronic Signature(s) Signed: 02/25/2023 10:51:55 AM By: Karie Schwalbe RN Entered By: Karie Schwalbe on 02/25/2023 08:27:17 -------------------------------------------------------------------------------- Activities of Daily Living Details Patient Name: Date of Service: Richard Alert [name] 02/25/2023 8:00 A M Medical Record Number: [id number] Patient Account Number: [id number] Date of Birth/Sex: Treating RN: Jul 06, 1940 (82 y.o. Dianna Limbo Primary Care Alsace Dowd: Seabron Spates Other Clinician: Referring Koran Seabrook: Treating Phelix Fudala/Extender: Verner Mould in Treatment: 0 Activities of Daily Living Items Answer Activities of Daily Living (Please select one for each item) Drive Automobile Need Assistance T Medications ake Completely Able Use T elephone Completely Able Care for Appearance Completely Able Use T oilet Completely Able Bath / Shower Completely Able Dress Self Completely Able Feed Self Completely Able Walk Need Assistance Get In / Out Bed Completely Able Housework Completely Able Prepare Meals Completely Able Handle Money Completely Able Shop for Self Completely Able Electronic  Signature(s) Signed: 02/26/2023 5:10:10 PM By: Karie Schwalbe RN Previous Signature: 02/25/2023 10:51:55 AM Version By: Karie Schwalbe RN Entered By: Karie Schwalbe on 02/26/2023 15:39:01 [name] ([id number]) [id number] Page 2 of 4 -------------------------------------------------------------------------------- Education Screening Details Patient Name: Date of Service: Richard Alert [name] 02/25/2023 8:00 A M Medical Record Number: [id number] Patient Account Number: [id number] Date of Birth/Sex: Treating RN: 01/25/1941 (82 y.o. Dianna Limbo Primary Care Gerasimos Plotts: Seabron Spates Other Clinician: Referring Durene Dodge: Treating Brinly Maietta/Extender: Verner Mould in Treatment: 0 Primary Learner Assessed: Patient Learning Preferences/Education Level/Primary Language Learning Preference: Explanation, Demonstration, Printed Material Highest Education Level: College or Above Preferred Language: English Cognitive Barrier Language Barrier: No Translator Needed: No Memory Deficit: No Emotional Barrier: No Cultural/Religious Beliefs Affecting Medical Care: No Physical Barrier Impaired Vision: No Impaired Hearing: No Decreased Hand dexterity: No Knowledge/Comprehension Knowledge Level: High Comprehension Level: High Ability to understand written instructions: High Ability to understand verbal instructions: High Motivation Anxiety Level: Calm Cooperation: Cooperative Education Importance: Acknowledges Need Interest in Health Problems: Asks Questions Perception: Coherent Willingness to Engage in Self-Management High Activities: Readiness to Engage in Self-Management High Activities: Electronic Signature(s) Signed: 02/26/2023 5:10:10 PM By: Karie Schwalbe RN Previous Signature: 02/25/2023 10:51:55 AM Version By: Karie Schwalbe RN Entered By: Karie Schwalbe on 02/26/2023  15:39:51 -------------------------------------------------------------------------------- Fall Risk Assessment Details Patient Name: Date of Service: Richard [name], Richard [name] 02/25/2023 8:00 A M Medical Record Number: [id number] Patient Account Number: [id number] Date of Birth/Sex: Treating RN: Aug 08, 1940 (82 y.o. Dianna Limbo Primary Care Vinod Mikesell: Seabron Spates Other Clinician: Referring Noeh Sparacino: Treating Anishka Bushard/Extender: Verner Mould in Treatment: 0 Fall Risk Assessment Items ANEEL, STEFANELLI ([id number]) [phone] [id number] Page 3 of 4 Have you had 2 or more falls in the last 12 monthso 0 No Have you had any fall that resulted in injury in the last 12 monthso 0 Yes FALLS RISK SCREEN  History of falling - immediate or within 3 months 0 No Secondary diagnosis (Do you have 2 or more medical diagnoseso) 0 No Ambulatory aid None/bed rest/wheelchair/nurse 0 No Crutches/cane/walker 0 No Furniture 0 No Intravenous therapy Access/Saline/Heparin Lock 0 No Gait/Transferring Normal/ bed rest/ wheelchair 0 No Weak (short steps with or without shuffle, stooped but able to lift head while walking, may seek 0 No support from furniture) Impaired (short steps with shuffle, may have difficulty arising from chair, head down, impaired 0 No balance) Mental Status Oriented to own ability 0 No Electronic Signature(s) Signed: 02/25/2023 10:51:55 AM By: Karie Schwalbe RN Entered By: Karie Schwalbe on 02/25/2023 09:07:08 -------------------------------------------------------------------------------- Foot Assessment Details Patient Name: Date of Service: Richard [name], [name] 02/25/2023 8:00 A M Medical Record Number: [id number] Patient Account Number: [id number] Date of Birth/Sex: Treating RN: 09-Jan-1941 (82 y.o. Dianna Limbo Primary Care Zettie Gootee: Seabron Spates Other Clinician: Referring Borna Wessinger: Treating Dejanay Wamboldt/Extender:  Verner Mould in Treatment: 0 Foot Assessment Items Site Locations + = Sensation present, - = Sensation absent, C = Callus, U = Ulcer R = Redness, W = Warmth, M = Maceration, PU = Pre-ulcerative lesion F = Fissure, S = Swelling, D = Dryness Assessment Right: Left: Other Deformity: No No Prior Foot Ulcer: No No Prior Amputation: No No Charcot Joint: No No Ambulatory Status: Ambulatory With Help Assistance Device: GUSTABO, BECH ([id number]) [phone] [id number] Page 4 of 4 Gait: Steady Electronic Signature(s) Signed: 02/26/2023 5:10:10 PM By: Karie Schwalbe RN Previous Signature: 02/25/2023 10:51:55 AM Version By: Karie Schwalbe RN Entered By: Karie Schwalbe on 02/26/2023 15:37:35 -------------------------------------------------------------------------------- Nutrition Risk Screening Details Patient Name: Date of Service: Richard Alert [name] 02/25/2023 8:00 A M Medical Record Number: [id number] Patient Account Number: [id number] Date of Birth/Sex: Treating RN: 03/04/1941 (82 y.o. Dianna Limbo Primary Care Damian Buckles: Seabron Spates Other Clinician: Referring Kynadi Dragos: Treating Rodgers Likes/Extender: Verner Mould in Treatment: 0 Height (in): 72 Weight (lbs): 230 Body Mass Index (BMI): 31.2 Nutrition Risk Screening Items Score Screening NUTRITION RISK SCREEN: I have an illness or condition that made me change the kind and/or amount of food I eat 0 No I eat fewer than two meals per day 0 No I eat few fruits and vegetables, or milk products 0 No I have three or more drinks of beer, liquor or wine almost every day 0 No I have tooth or mouth problems that make it hard for me to eat 0 No I don't always have enough money to buy the food I need 0 No I eat alone most of the time 0 No I take three or more different prescribed or over-the-counter drugs a day 1 Yes Without wanting to, I have  lost or gained 10 pounds in the last six months 0 No I am not always physically able to shop, cook and/or feed myself 0 No Nutrition Protocols Good Risk Protocol 0 No interventions needed Moderate Risk Protocol High Risk Proctocol Risk Level: Good Risk Score: 1 Electronic Signature(s) Signed: 02/25/2023 10:51:55 AM By: Karie Schwalbe RN Entered By: Karie Schwalbe on 02/25/2023 09:07:21

## 2023-02-25 NOTE — Progress Notes (Signed)
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 Exposed: Yes Necrotic Quality: Eschar, Adherent Slough Tendon Exposed: No Muscle Exposed: No Joint Exposed: No Bone Exposed: No Periwound Skin Texture Texture Color No Abnormalities Noted: No No Abnormalities Noted: Yes Scarring: Yes Temperature / Pain Temperature: No Abnormality Moisture No Abnormalities Noted: Yes Treatment Notes Wound #12 (Knee) Wound Laterality: Right Cleanser Soap and Water Discharge Instruction: May shower and wash wound with dial antibacterial soap and water prior to dressing change. Vashe 5.8 (oz) Discharge Instruction: Cleanse the wound with Vashe prior to  applying a clean dressing using gauze sponges, not tissue or cotton balls. Wound Cleanser Discharge Instruction: Cleanse the wound with wound cleanser prior to applying a clean dressing using gauze sponges, not tissue or cotton balls. Peri-Wound Care Topical Primary Dressing Maxorb Extra Ag+ Alginate Dressing, 2x2 (in/in) Discharge Instruction: Apply to wound bed as instructed Secondary Dressing Bordered Gauze, 2x2 in Discharge Instruction: Apply over primary dressing as directed. Secured With Compression Richard [name], Richard [name] ([id number]) [id number] Page 12 of 16 Compression Stockings Add-Ons Electronic Signature(s) Signed: 02/25/2023 10:51:55 AM By: Karie Schwalbe RN Entered By: Karie Schwalbe on 02/25/2023 08:53:46 -------------------------------------------------------------------------------- Wound Assessment Details Patient Name: Date of Service: Richard Alert [name] 02/25/2023 8:00 A M Medical Record Number: [id number] Patient Account Number: [id number] Date of Birth/Sex: Treating RN: 10-Mar-1941 (82 y.o. Dianna Limbo Primary Care Javad Salva: Seabron Spates Other Clinician: Referring Morene Cecilio: Treating Izayiah Tibbitts/Extender: Verner Mould in Treatment: 0 Wound Status Wound Number: 13 Primary Trauma, Other Etiology: Wound Location: Right, Proximal, Anterior Lower Leg Wound Open Wounding Event: Skin Tear/Laceration Status: Date Acquired: 02/05/2023 Comorbid Cataracts, Anemia, Sleep Apnea, Arrhythmia, Congestive Heart Weeks Of Treatment: 0 History: Failure, Coronary Artery Disease, Hypertension, Type II Diabetes, Clustered Wound: No Osteoarthritis, Neuropathy, Received Chemotherapy Photos Wound Measurements Length: (cm) 3.3 Width: (cm) 2 Depth: (cm) 0.1 Area: (cm) 5.184 Volume: (cm) 0.518 % Reduction in Area: % Reduction in Volume: Epithelialization: Small (1-33%) Tunneling: No Undermining: No Wound  Description Classification: Full Thickness Without Exposed Suppor Exudate Amount: Medium Exudate Type: Serosanguineous Exudate Color: red, brown t Structures Foul Odor After Cleansing: No Slough/Fibrino Yes Wound Bed Granulation Amount: Medium (34-66%) Exposed Structure Granulation Quality: Red Fat Layer (Subcutaneous Tissue) Exposed: Yes Necrotic Amount: Medium (34-66%) Necrotic Quality: Eschar, Adherent Slough Periwound Skin Texture Texture Color No Abnormalities Noted: No No Abnormalities Noted: Yes Scarring: Yes Temperature / Pain Temperature: No Abnormality Moisture No Abnormalities NotedJERRAD, Richard [name] ([id number]) [id number] Page 13 of 16 Treatment Notes Wound #13 (Lower  Leg) Wound Laterality: Right, Anterior, Proximal Cleanser Soap and Water Discharge Instruction: May shower and wash wound with dial antibacterial soap and water prior to dressing change. Vashe 5.8 (oz) Discharge Instruction: Cleanse the wound with Vashe prior to applying a clean dressing using gauze sponges, not tissue or cotton balls. Wound Cleanser Discharge Instruction: Cleanse the wound with wound cleanser prior to applying a clean dressing using gauze sponges, not tissue or cotton balls. Peri-Wound Care Topical Primary Dressing Maxorb Extra Ag+ Alginate Dressing, 4x4.75 (in/in) Discharge Instruction: Apply to wound bed as instructed Secondary Dressing Woven Gauze Sponge, Non-Sterile 4x4 in Discharge Instruction: Apply over primary dressing as directed. Secured With Compression Wrap Urgo K2 Lite, (equivalent to a 3 layer) two layer compression system, regular Discharge Instruction: Apply Urgo K2 Lite as directed (alternative to 3 layer compression). Tubular netting #5 Compression Stockings Add-Ons Electronic Signature(s) Signed: 02/25/2023 10:51:55 AM By: Karie Schwalbe RN Entered By: Karie Schwalbe on 02/25/2023  08:54:32 -------------------------------------------------------------------------------- Wound Assessment Details Patient Name: Date of Service: Richard Leas, [name] 02/25/2023 8:00 A M Medical Record Number: [id number] Patient Account Number: [id number] Date of Birth/Sex: Treating RN: 1940-12-11 (82 y.o. Dianna Limbo Primary Care Sharayah Renfrow: Seabron Spates Other Clinician: Referring Felissa Blouch: Treating Basil Buffin/Extender: Verner Mould in Treatment: 0 Wound Status Wound Number: 14 Primary Trauma, Other Etiology: Wound Location: Right, Anterior Lower Leg Wound Open Wounding Event: Skin Tear/Laceration Status: Date Acquired: 02/05/2023 Comorbid Cataracts, Anemia, Sleep Apnea, Arrhythmia, Congestive Heart Weeks Of Treatment: 0 History: Failure, Coronary Artery Disease, Hypertension, Type II Diabetes, Clustered Wound: No Osteoarthritis, Neuropathy, Received Chemotherapy Photos Richard [name], Richard [name] ([id number]) [id number] Page 14 of 16 Wound Measurements Length: (cm) 2.5 Width: (cm) 1.5 Depth: (cm) 0.1 Area: (cm) 2.945 Volume: (cm) 0.295 % Reduction in Area: % Reduction in Volume: Epithelialization: Small (1-33%) Tunneling: No Undermining: No Wound Description Classification: Full Thickness Without Exposed Support Structures Exudate Amount: Medium Exudate Type: Serosanguineous Exudate Color: red, brown Foul Odor After Cleansing: No Slough/Fibrino Yes Wound Bed Granulation Amount: Small (1-33%) Exposed Structure Granulation Quality: Red Fascia Exposed: No Necrotic Amount: Large (67-100%) Fat Layer (Subcutaneous Tissue) Exposed: Yes Necrotic Quality: Eschar Tendon Exposed: No Muscle Exposed: No Joint Exposed: No Bone Exposed: No Periwound Skin Texture Texture Color No Abnormalities Noted: No No Abnormalities Noted: Yes Scarring: Yes Temperature / Pain Temperature: No Abnormality Moisture No Abnormalities  Noted: Yes Treatment Notes Wound #14 (Lower Leg) Wound Laterality: Right, Anterior Cleanser Soap and Water Discharge Instruction: May shower and wash wound with dial antibacterial soap and water prior to dressing change. Vashe 5.8 (oz) Discharge Instruction: Cleanse the wound with Vashe prior to applying a clean dressing using gauze sponges, not tissue or cotton balls. Wound Cleanser Discharge Instruction: Cleanse the wound with wound cleanser prior to applying a clean dressing using gauze sponges, not tissue or cotton balls. Peri-Wound Care Topical Primary Dressing Maxorb Extra Ag+ Alginate Dressing, 4x4.75 (in/in) Discharge Instruction: Apply to wound bed as instructed Secondary Dressing Woven Gauze Sponge, Non-Sterile 4x4 in Discharge Instruction: Apply over primary dressing as directed. Secured With Compression Wrap Urgo K2 Lite, (equivalent to a 3 layer) two layer compression system, regular Discharge Instruction: Apply Urgo K2 Lite as directed (alternative to 3 layer compression). Tubular netting #5 Compression Stockings Richard [name], Richard [name] ([id number]) [id number].pdf Page 15 of 16 Add-Ons Electronic Signature(s) Signed: 02/25/2023 10:51:55 AM By: Karie Schwalbe RN Entered By: Karie Schwalbe on 02/25/2023 08:55:23 -------------------------------------------------------------------------------- Wound Assessment Details Patient Name: Date of Service: Richard [name], Richard [name] 02/25/2023  8:00 A M Medical Record Number: [id number] Patient Account Number: [id number] Date of Birth/Sex: Treating RN: 1940-07-10 (82 y.o. Dianna Limbo Primary Care Kamaal Cast: Seabron Spates Other Clinician: Referring Eowyn Tabone: Treating Mizael Sagar/Extender: Verner Mould in Treatment: 0 Wound Status Wound Number: 15 Primary Trauma, Other Etiology: Wound Location: Right, Medial Lower Leg Wound Open Wounding Event: Skin Tear/Laceration Status: Date  Acquired: 02/05/2023 Comorbid Cataracts, Anemia, Sleep Apnea, Arrhythmia, Congestive Heart Weeks Of Treatment: 0 History: Failure, Coronary Artery Disease, Hypertension, Type II Diabetes, Clustered Wound: No Osteoarthritis, Neuropathy, Received Chemotherapy Photos Wound Measurements Length: (cm) 0.6 Width: (cm) 0.7 Depth: (cm) 0.1 Area: (cm) 0.33 Volume: (cm) 0.033 % Reduction in Area: % Reduction in Volume: Epithelialization: Small (1-33%) Tunneling: No Undermining: No Wound Description Classification: Full Thickness Without Exposed Suppor Exudate Amount: Medium Exudate Type: Serosanguineous Exudate Color: red, brown t Structures Foul Odor After Cleansing: No Slough/Fibrino Yes Wound Bed Granulation Amount: Medium (34-66%) Exposed Structure Granulation Quality: Red Fat Layer (Subcutaneous Tissue) Exposed: Yes Necrotic Amount: Medium (34-66%) Necrotic Quality: Eschar Periwound Skin Texture Texture Color No Abnormalities Noted: Yes No Abnormalities Noted: Yes Moisture Temperature / Pain No Abnormalities Noted: Yes Temperature: No Abnormality Treatment Notes Richard [name], Richard [name] ([id number]) [id number] Page 16 of 16 Wound #15 (Lower Leg) Wound Laterality: Right, Medial Cleanser Soap and Water Discharge Instruction: May shower and wash wound with dial antibacterial soap and water prior to dressing change. Vashe 5.8 (oz) Discharge Instruction: Cleanse the wound with Vashe prior to applying a clean dressing using gauze sponges, not tissue or cotton balls. Wound Cleanser Discharge Instruction: Cleanse the wound with wound cleanser prior to applying a clean dressing using gauze sponges, not tissue or cotton balls. Peri-Wound Care Topical Primary Dressing Maxorb Extra Ag+ Alginate Dressing, 4x4.75 (in/in) Discharge Instruction: Apply to wound bed as instructed Secondary Dressing Woven Gauze Sponge, Non-Sterile 4x4 in Discharge Instruction: Apply over  primary dressing as directed. Secured With Compression Wrap Urgo K2 Lite, (equivalent to a 3 layer) two layer compression system, regular Discharge Instruction: Apply Urgo K2 Lite as directed (alternative to 3 layer compression). Tubular netting #5 Compression Stockings Add-Ons Electronic Signature(s) Signed: 02/25/2023 10:51:55 AM By: Karie Schwalbe RN Entered By: Karie Schwalbe on 02/25/2023 09:38:50 -------------------------------------------------------------------------------- Vitals Details Patient Name: Date of Service: Richard Leas, [name] 02/25/2023 8:00 A M Medical Record Number: [id number] Patient Account Number: [id number] Date of Birth/Sex: Treating RN: 1940-12-16 (82 y.o. Dianna Limbo Primary Care Cardale Dorer: Seabron Spates Other Clinician: Referring Ariadna Setter: Treating Jeryl Umholtz/Extender: Verner Mould in Treatment: 0 Vital Signs Time Taken: 08:12 Temperature (F): 97.7 Height (in): 72 Pulse (bpm): 73 Weight (lbs): 230 Respiratory Rate (breaths/min): 18 Body Mass Index (BMI): 31.2 Blood Pressure (mmHg): 111/71 Capillary Blood Glucose (mg/dl): 630 Reference Range: 80 - 120 mg / dl Electronic Signature(s) Signed: 02/25/2023 10:51:55 AM By: Karie Schwalbe RN Entered By: Karie Schwalbe on 02/25/2023 08:25:59

## 2023-02-26 DIAGNOSIS — I442 Atrioventricular block, complete: Secondary | ICD-10-CM | POA: Diagnosis not present

## 2023-02-26 DIAGNOSIS — I498 Other specified cardiac arrhythmias: Secondary | ICD-10-CM | POA: Diagnosis not present

## 2023-02-27 ENCOUNTER — Inpatient Hospital Stay: Payer: Medicare Other | Admitting: Hematology & Oncology

## 2023-02-27 ENCOUNTER — Inpatient Hospital Stay: Payer: Medicare Other

## 2023-02-27 DIAGNOSIS — I498 Other specified cardiac arrhythmias: Secondary | ICD-10-CM | POA: Diagnosis not present

## 2023-02-28 DIAGNOSIS — M1990 Unspecified osteoarthritis, unspecified site: Secondary | ICD-10-CM | POA: Diagnosis not present

## 2023-02-28 DIAGNOSIS — Z951 Presence of aortocoronary bypass graft: Secondary | ICD-10-CM | POA: Diagnosis not present

## 2023-02-28 DIAGNOSIS — G4730 Sleep apnea, unspecified: Secondary | ICD-10-CM | POA: Diagnosis not present

## 2023-02-28 DIAGNOSIS — I110 Hypertensive heart disease with heart failure: Secondary | ICD-10-CM | POA: Diagnosis not present

## 2023-02-28 DIAGNOSIS — Z9181 History of falling: Secondary | ICD-10-CM | POA: Diagnosis not present

## 2023-02-28 DIAGNOSIS — D631 Anemia in chronic kidney disease: Secondary | ICD-10-CM | POA: Diagnosis not present

## 2023-02-28 DIAGNOSIS — L97428 Non-pressure chronic ulcer of left heel and midfoot with other specified severity: Secondary | ICD-10-CM | POA: Diagnosis not present

## 2023-02-28 DIAGNOSIS — Z8572 Personal history of non-Hodgkin lymphomas: Secondary | ICD-10-CM | POA: Diagnosis not present

## 2023-02-28 DIAGNOSIS — N1832 Chronic kidney disease, stage 3b: Secondary | ICD-10-CM | POA: Diagnosis not present

## 2023-02-28 DIAGNOSIS — S81811D Laceration without foreign body, right lower leg, subsequent encounter: Secondary | ICD-10-CM | POA: Diagnosis not present

## 2023-02-28 DIAGNOSIS — E11621 Type 2 diabetes mellitus with foot ulcer: Secondary | ICD-10-CM | POA: Diagnosis not present

## 2023-02-28 DIAGNOSIS — I5042 Chronic combined systolic (congestive) and diastolic (congestive) heart failure: Secondary | ICD-10-CM | POA: Diagnosis not present

## 2023-02-28 DIAGNOSIS — E1140 Type 2 diabetes mellitus with diabetic neuropathy, unspecified: Secondary | ICD-10-CM | POA: Diagnosis not present

## 2023-02-28 DIAGNOSIS — I2510 Atherosclerotic heart disease of native coronary artery without angina pectoris: Secondary | ICD-10-CM | POA: Diagnosis not present

## 2023-02-28 DIAGNOSIS — M4800 Spinal stenosis, site unspecified: Secondary | ICD-10-CM | POA: Diagnosis not present

## 2023-02-28 DIAGNOSIS — I499 Cardiac arrhythmia, unspecified: Secondary | ICD-10-CM | POA: Diagnosis not present

## 2023-03-05 ENCOUNTER — Encounter (HOSPITAL_BASED_OUTPATIENT_CLINIC_OR_DEPARTMENT_OTHER): Payer: Medicare Other | Attending: General Surgery | Admitting: General Surgery

## 2023-03-05 DIAGNOSIS — E11622 Type 2 diabetes mellitus with other skin ulcer: Secondary | ICD-10-CM | POA: Insufficient documentation

## 2023-03-05 DIAGNOSIS — L97522 Non-pressure chronic ulcer of other part of left foot with fat layer exposed: Secondary | ICD-10-CM | POA: Diagnosis not present

## 2023-03-05 DIAGNOSIS — I5042 Chronic combined systolic (congestive) and diastolic (congestive) heart failure: Secondary | ICD-10-CM | POA: Insufficient documentation

## 2023-03-05 DIAGNOSIS — Z951 Presence of aortocoronary bypass graft: Secondary | ICD-10-CM | POA: Insufficient documentation

## 2023-03-05 DIAGNOSIS — C8330 Diffuse large B-cell lymphoma, unspecified site: Secondary | ICD-10-CM | POA: Diagnosis not present

## 2023-03-05 DIAGNOSIS — L97812 Non-pressure chronic ulcer of other part of right lower leg with fat layer exposed: Secondary | ICD-10-CM | POA: Diagnosis not present

## 2023-03-05 DIAGNOSIS — L97822 Non-pressure chronic ulcer of other part of left lower leg with fat layer exposed: Secondary | ICD-10-CM | POA: Insufficient documentation

## 2023-03-05 DIAGNOSIS — I2510 Atherosclerotic heart disease of native coronary artery without angina pectoris: Secondary | ICD-10-CM | POA: Diagnosis not present

## 2023-03-05 DIAGNOSIS — E1122 Type 2 diabetes mellitus with diabetic chronic kidney disease: Secondary | ICD-10-CM | POA: Insufficient documentation

## 2023-03-05 DIAGNOSIS — N1832 Chronic kidney disease, stage 3b: Secondary | ICD-10-CM | POA: Insufficient documentation

## 2023-03-05 DIAGNOSIS — I130 Hypertensive heart and chronic kidney disease with heart failure and stage 1 through stage 4 chronic kidney disease, or unspecified chronic kidney disease: Secondary | ICD-10-CM | POA: Diagnosis not present

## 2023-03-05 DIAGNOSIS — E11621 Type 2 diabetes mellitus with foot ulcer: Secondary | ICD-10-CM | POA: Diagnosis not present

## 2023-03-05 DIAGNOSIS — D649 Anemia, unspecified: Secondary | ICD-10-CM | POA: Insufficient documentation

## 2023-03-05 DIAGNOSIS — S81802A Unspecified open wound, left lower leg, initial encounter: Secondary | ICD-10-CM | POA: Diagnosis not present

## 2023-03-05 DIAGNOSIS — E1142 Type 2 diabetes mellitus with diabetic polyneuropathy: Secondary | ICD-10-CM | POA: Diagnosis not present

## 2023-03-05 DIAGNOSIS — S81801A Unspecified open wound, right lower leg, initial encounter: Secondary | ICD-10-CM | POA: Diagnosis not present

## 2023-03-07 ENCOUNTER — Encounter: Payer: Self-pay | Admitting: Family

## 2023-03-07 ENCOUNTER — Inpatient Hospital Stay: Payer: Medicare Other

## 2023-03-07 ENCOUNTER — Inpatient Hospital Stay: Payer: Medicare Other | Attending: Hematology & Oncology

## 2023-03-07 ENCOUNTER — Inpatient Hospital Stay (HOSPITAL_BASED_OUTPATIENT_CLINIC_OR_DEPARTMENT_OTHER): Payer: Medicare Other | Admitting: Family

## 2023-03-07 VITALS — BP 128/80 | HR 65 | Temp 97.7°F | Resp 17 | Wt 234.8 lb

## 2023-03-07 DIAGNOSIS — C8332 Diffuse large B-cell lymphoma, intrathoracic lymph nodes: Secondary | ICD-10-CM | POA: Insufficient documentation

## 2023-03-07 DIAGNOSIS — D510 Vitamin B12 deficiency anemia due to intrinsic factor deficiency: Secondary | ICD-10-CM

## 2023-03-07 DIAGNOSIS — D509 Iron deficiency anemia, unspecified: Secondary | ICD-10-CM

## 2023-03-07 DIAGNOSIS — D508 Other iron deficiency anemias: Secondary | ICD-10-CM

## 2023-03-07 DIAGNOSIS — Z79899 Other long term (current) drug therapy: Secondary | ICD-10-CM | POA: Diagnosis not present

## 2023-03-07 DIAGNOSIS — S81811D Laceration without foreign body, right lower leg, subsequent encounter: Secondary | ICD-10-CM | POA: Diagnosis not present

## 2023-03-07 DIAGNOSIS — E611 Iron deficiency: Secondary | ICD-10-CM | POA: Insufficient documentation

## 2023-03-07 DIAGNOSIS — L97428 Non-pressure chronic ulcer of left heel and midfoot with other specified severity: Secondary | ICD-10-CM | POA: Diagnosis not present

## 2023-03-07 DIAGNOSIS — I5042 Chronic combined systolic (congestive) and diastolic (congestive) heart failure: Secondary | ICD-10-CM | POA: Diagnosis not present

## 2023-03-07 DIAGNOSIS — E11621 Type 2 diabetes mellitus with foot ulcer: Secondary | ICD-10-CM | POA: Diagnosis not present

## 2023-03-07 DIAGNOSIS — D631 Anemia in chronic kidney disease: Secondary | ICD-10-CM | POA: Diagnosis not present

## 2023-03-07 DIAGNOSIS — I110 Hypertensive heart disease with heart failure: Secondary | ICD-10-CM | POA: Diagnosis not present

## 2023-03-07 LAB — CBC WITH DIFFERENTIAL (CANCER CENTER ONLY)
Abs Immature Granulocytes: 0.06 10*3/uL (ref 0.00–0.07)
Basophils Absolute: 0 10*3/uL (ref 0.0–0.1)
Basophils Relative: 0 %
Eosinophils Absolute: 0.1 10*3/uL (ref 0.0–0.5)
Eosinophils Relative: 1 %
HCT: 45.6 % (ref 39.0–52.0)
Hemoglobin: 14.8 g/dL (ref 13.0–17.0)
Immature Granulocytes: 1 %
Lymphocytes Relative: 16 %
Lymphs Abs: 1.6 10*3/uL (ref 0.7–4.0)
MCH: 29.9 pg (ref 26.0–34.0)
MCHC: 32.5 g/dL (ref 30.0–36.0)
MCV: 92.1 fL (ref 80.0–100.0)
Monocytes Absolute: 0.5 10*3/uL (ref 0.1–1.0)
Monocytes Relative: 5 %
Neutro Abs: 8 10*3/uL — ABNORMAL HIGH (ref 1.7–7.7)
Neutrophils Relative %: 77 %
Platelet Count: 176 10*3/uL (ref 150–400)
RBC: 4.95 MIL/uL (ref 4.22–5.81)
RDW: 16.4 % — ABNORMAL HIGH (ref 11.5–15.5)
WBC Count: 10.3 10*3/uL (ref 4.0–10.5)
nRBC: 0 % (ref 0.0–0.2)

## 2023-03-07 LAB — CMP (CANCER CENTER ONLY)
ALT: 19 U/L (ref 0–44)
AST: 16 U/L (ref 15–41)
Albumin: 4.1 g/dL (ref 3.5–5.0)
Alkaline Phosphatase: 65 U/L (ref 38–126)
Anion gap: 10 (ref 5–15)
BUN: 45 mg/dL — ABNORMAL HIGH (ref 8–23)
CO2: 30 mmol/L (ref 22–32)
Calcium: 9.6 mg/dL (ref 8.9–10.3)
Chloride: 98 mmol/L (ref 98–111)
Creatinine: 1.79 mg/dL — ABNORMAL HIGH (ref 0.61–1.24)
GFR, Estimated: 37 mL/min — ABNORMAL LOW (ref >60)
Glucose, Bld: 361 mg/dL — ABNORMAL HIGH (ref 70–99)
Potassium: 4.5 mmol/L (ref 3.5–5.1)
Sodium: 138 mmol/L (ref 135–145)
Total Bilirubin: 0.7 mg/dL (ref 0.3–1.2)
Total Protein: 6.5 g/dL (ref 6.5–8.1)

## 2023-03-07 LAB — IRON AND IRON BINDING CAPACITY (CC-WL,HP ONLY)
Iron: 114 ug/dL (ref 45–182)
Saturation Ratios: 29 % (ref 17.9–39.5)
TIBC: 398 ug/dL (ref 250–450)
UIBC: 284 ug/dL (ref 117–376)

## 2023-03-07 LAB — LACTATE DEHYDROGENASE: LDH: 147 U/L (ref 98–192)

## 2023-03-07 LAB — FERRITIN: Ferritin: 1692 ng/mL — ABNORMAL HIGH (ref 24–336)

## 2023-03-07 MED ORDER — CYANOCOBALAMIN 1000 MCG/ML IJ SOLN
1000.0000 ug | Freq: Once | INTRAMUSCULAR | Status: AC
  Administered 2023-03-07: 1000 ug via INTRAMUSCULAR
  Filled 2023-03-07: qty 1

## 2023-03-07 NOTE — Patient Instructions (Signed)
 Vitamin B12 Injection What is this medication? Vitamin B12 (VAHY tuh min B12) prevents and treats low vitamin B12 levels in your body. It is used in people who do not get enough vitamin B12 from their diet or when their digestive tract does not absorb enough. Vitamin B12 plays an important role in maintaining the health of your nervous system and red blood cells. This medicine may be used for other purposes; ask your health care provider or pharmacist if you have questions. COMMON BRAND NAME(S): B-12 Compliance Kit, B-12 Injection Kit, Cyomin, Dodex, LA-12, Nutri-Twelve, Physicians EZ Use B-12, Primabalt, Vitamin Deficiency Injectable System - B12 What should I tell my care team before I take this medication? They need to know if you have any of these conditions: Kidney disease Leber's disease Megaloblastic anemia An unusual or allergic reaction to cyanocobalamin, cobalt, other medications, foods, dyes, or preservatives Pregnant or trying to get pregnant Breast-feeding How should I use this medication? This medication is injected into a muscle or deeply under the skin. It is usually given in a clinic or care team's office. However, your care team may teach you how to inject yourself. Follow all instructions. Talk to your care team about the use of this medication in children. Special care may be needed. Overdosage: If you think you have taken too much of this medicine contact a poison control center or emergency room at once. NOTE: This medicine is only for you. Do not share this medicine with others. What if I miss a dose? If you are given your dose at a clinic or care team's office, call to reschedule your appointment. If you give your own injections, and you miss a dose, take it as soon as you can. If it is almost time for your next dose, take only that dose. Do not take double or extra doses. What may interact with this medication? Alcohol Colchicine This list may not describe all possible  interactions. Give your health care provider a list of all the medicines, herbs, non-prescription drugs, or dietary supplements you use. Also tell them if you smoke, drink alcohol, or use illegal drugs. Some items may interact with your medicine. What should I watch for while using this medication? Visit your care team regularly. You may need blood work done while you are taking this medication. You may need to follow a special diet. Talk to your care team. Limit your alcohol intake and avoid smoking to get the best benefit. What side effects may I notice from receiving this medication? Side effects that you should report to your care team as soon as possible: Allergic reactions--skin rash, itching, hives, swelling of the face, lips, tongue, or throat Swelling of the ankles, hands, or feet Trouble breathing Side effects that usually do not require medical attention (report to your care team if they continue or are bothersome): Diarrhea This list may not describe all possible side effects. Call your doctor for medical advice about side effects. You may report side effects to FDA at 1-800-FDA-1088. Where should I keep my medication? Keep out of the reach of children. Store at room temperature between 15 and 30 degrees C (59 and 85 degrees F). Protect from light. Throw away any unused medication after the expiration date. NOTE: This sheet is a summary. It may not cover all possible information. If you have questions about this medicine, talk to your doctor, pharmacist, or health care provider.  2024 Elsevier/Gold Standard (2021-02-27 00:00:00)

## 2023-03-07 NOTE — Progress Notes (Signed)
Hematology and Oncology Follow Up Visit  Richard [name] [id number] 1940-09-05 82 y.o. 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 He is doing well. He does have a callus on the left heel that became infected and is seeing wound care. They have also removed some scabs from the right leg which is also wrapped. He states that all these areas are healing nicely.  No redness are edema noted at this time.  He is scheduled to have his new pacemaker placed on 04/01/2023. Venogram is next week on 03/14/2023.  He has not noted any blood loss. No petechiae.  No fever, chills, n/v, cough, rash, dizziness, SOB, chest pain, palpitations, abdominal pain or changes in bowel or bladder habits.  No falls or syncope.  His golf game is on hold while his left heel is healing.  Appetite and hydration are good. Weight is 234 lbs.   ECOG Performance Status: 1 - Symptomatic but completely ambulatory  Medications:  Allergies as of 03/07/2023       Reactions   Ace Inhibitors Swelling, Other (See Comments)   Angioedema   Benazepril Swelling, Other (See Comments)   Angioedema; he is not a candidate for any angiotensin receptor blockers because of this significant allergic reaction. Because of a history of documented adverse serious drug reaction;Medi Alert bracelet  is recommended   Entresto [sacubitril-valsartan] Swelling, Other (See Comments)   First-in-Class Angiotensin Receptor Neprilysin Inhibitor- Med was "red-flagged" by the patient's pharmacy for him to NOT take!   Hctz [hydrochlorothiazide] Anaphylaxis, Swelling    Tongue and lip swelling   Aspirin Other (See Comments)   Gastritis, can aspirin not take 325 mg aspirin        Medication List        Accurate as of March 07, 2023 10:09 AM. If you have any questions, ask your nurse or doctor.          acetaminophen 325 MG tablet Commonly known as: TYLENOL Take 650 mg by mouth at bedtime as needed for moderate pain or headache.   allopurinol 300 MG tablet Commonly known as: ZYLOPRIM Take 450 mg by mouth daily.   aluminum hydroxide-magnesium carbonate 95-358 MG/15ML Susp Commonly known as: GAVISCON Take 15 mLs by mouth as needed for indigestion or heartburn.   atorvastatin 80 MG tablet Commonly known as: LIPITOR TAKE 1 TABLET BY MOUTH EVERYDAY AT BEDTIME   Azelastine HCl 137 MCG/SPRAY Soln PLACE 2 SPRAYS INTO BOTH NOSTRILS AT BEDTIME AS NEEDED FOR RHINITIS OR ALLERGIES.   CO Q-10 PO Take 1 capsule by mouth daily.   Eliquis 2.5 MG Tabs tablet Generic drug: apixaban TAKE 1 TABLET BY MOUTH TWICE A DAY   esomeprazole 40 MG capsule Commonly known as: NexIUM Take 1 capsule (40 mg total) by mouth daily. What changed: when to take this   ezetimibe 10 MG tablet Commonly known as: ZETIA Take 1 tablet (10 mg total) by mouth daily.   Farxiga 10 MG Tabs tablet Generic drug: dapagliflozin propanediol Take 10 mg by mouth in the  morning.   fenofibrate 160 MG tablet TAKE 1 TABLET BY MOUTH EVERY DAY   folic acid 1 MG tablet Commonly known as: FOLVITE TAKE 2 TABLETS BY MOUTH EVERY DAY   freestyle lancets USE ONCE A DAY TO CHECK BLOOD SUGAR.   FREESTYLE LITE test strip Generic drug: glucose blood USE TO TEST BLOOD SUGAR ONCE A DAY. DX CODE: E11.9   glimepiride 2 MG tablet Commonly known as: AMARYL Take 1 tablet (2 mg total) by mouth in the morning and at bedtime.   icosapent Ethyl 1 g capsule Commonly known as: Vascepa Take 2 capsules (2 g total) by mouth 2 (two) times daily.   ICY HOT ADVANCED PAIN RELIEF EX Apply 1  application  topically daily as needed (to painful sites).   Klor-Con M20 20 MEQ tablet Generic drug: potassium chloride SA TAKE 2 TABLETS BY MOUTH DAILY   metoprolol succinate 25 MG 24 hr tablet Commonly known as: TOPROL-XL TAKE 1 TABLET (25 MG TOTAL) BY MOUTH DAILY.   nitroGLYCERIN 0.4 MG SL tablet Commonly known as: NITROSTAT PLACE 1 TABLET UNDER THE TONGUE EVERY 5 MINUTES AS NEEDED FOR CHEST PAIN.   pregabalin 100 MG capsule Commonly known as: Lyrica Take 1 capsule (100 mg total) by mouth daily. At noon   pregabalin 150 MG capsule Commonly known as: LYRICA Take 1 capsule (150 mg total) by mouth at bedtime. (Take 100mg  daily at noon)   psyllium 58.6 % powder Commonly known as: METAMUCIL Take 1 packet by mouth daily as needed (for constipation- mix and drink).   senna 8.6 MG Tabs tablet Commonly known as: SENOKOT Take 2 tablets (17.2 mg total) by mouth at bedtime. This is for constipation.  Stop taking if you start having diarrhea. What changed:  when to take this reasons to take this   torsemide 20 MG tablet Commonly known as: DEMADEX Take 2 tablets (40 mg total) by mouth in the morning. Take extra tablet for weight gain more then 2 to 3 lbs in 24 hours.   Trulicity 3 MG/0.5ML Sopn Generic drug: Dulaglutide Inject 3 mg into the skin once a week.   VITAMIN C PO Take 1 tablet by mouth daily with breakfast.        Allergies:  Allergies  Allergen Reactions   Ace Inhibitors Swelling and Other (See Comments)    Angioedema    Benazepril Swelling and Other (See Comments)    Angioedema; he is not a candidate for any angiotensin receptor blockers because of this significant allergic reaction. Because of a history of documented adverse serious drug reaction;Medi Alert bracelet  is recommended   Entresto [Sacubitril-Valsartan] Swelling and Other (See Comments)    First-in-Class Angiotensin Receptor Neprilysin Inhibitor- Med was "red-flagged" by the patient's pharmacy  for him to NOT take!   Hctz [Hydrochlorothiazide] Anaphylaxis and Swelling    Tongue and lip swelling    Aspirin Other (See Comments)    Gastritis, can aspirin not take 325 mg aspirin    Past Medical History, Surgical history, Social history, and Family History were reviewed and updated.  Review of Systems: All other 10 point review of systems is negative.   Physical Exam:  weight is 234 lb 12.8 oz (106.5 kg). His oral temperature is 97.7 F (36.5 C). His blood pressure is 128/80 and his pulse is 65. His respiration is 17 and oxygen saturation is 97%.   Wt Readings from Last 3 Encounters:  03/07/23 234 lb 12.8 oz (106.5 kg)  02/18/23 239 lb 9.6  oz (108.7 kg)  01/30/23 239 lb (108.4 kg)    Ocular: Sclerae unicteric, pupils equal, round and reactive to light Ear-nose-throat: Oropharynx clear, dentition fair Lymphatic: No cervical or supraclavicular adenopathy Lungs no rales or rhonchi, good excursion bilaterally Heart regular rate and rhythm, no murmur appreciated Abd soft, nontender, positive bowel sounds MSK no focal spinal tenderness, no joint edema Neuro: non-focal, well-oriented, appropriate affect Breasts: Deferred   Lab Results  Component Value Date   WBC 10.3 03/07/2023   HGB 14.8 03/07/2023   HCT 45.6 03/07/2023   MCV 92.1 03/07/2023   PLT 176 03/07/2023   Lab Results  Component Value Date   FERRITIN 1,587 (H) 01/30/2023   IRON 93 01/30/2023   TIBC 333 01/30/2023   UIBC 240 01/30/2023   IRONPCTSAT 28 01/30/2023   Lab Results  Component Value Date   RETICCTPCT 1.8 01/30/2023   RBC 4.95 03/07/2023   RETICCTABS 52.1 11/22/2011   No results found for: "KPAFRELGTCHN", "LAMBDASER", "[facility]" Lab Results  Component Value Date   IGA 159 03/09/2012   Lab Results  Component Value Date   ALBUMINELP 4.2 07/27/2018   MSPIKE Not Observed 07/27/2018     Chemistry      Component Value Date/Time   NA 138 03/07/2023 0743   NA 138 02/08/2022 0811   NA  144 06/27/2017 0857   K 4.5 03/07/2023 0743   K 3.9 06/27/2017 0857   CL 98 03/07/2023 0743   CL 103 06/27/2017 0857   CO2 30 03/07/2023 0743   CO2 26 06/27/2017 0857   BUN 45 (H) 03/07/2023 0743   BUN 46 (H) 02/08/2022 0811   BUN 12 06/27/2017 0857   CREATININE 1.79 (H) 03/07/2023 0743   CREATININE 1.65 (H) 04/18/2020 0956      Component Value Date/Time   CALCIUM 9.6 03/07/2023 0743   CALCIUM 9.2 06/27/2017 0857   ALKPHOS 65 03/07/2023 0743   ALKPHOS 128 (H) 06/27/2017 0857   AST 16 03/07/2023 0743   ALT 19 03/07/2023 0743   ALT 24 06/27/2017 0857   BILITOT 0.7 03/07/2023 0743       Impression and Plan: Richard [name] is a very pleasant 82 yo caucasian gentleman with diffuse large B-cell lymphoma (not "double hit" lymphoma). He completed treatment in February 2019.  Iron studies pending.  B 12 given today.  Rivka Barbara due again in November.  Follow-up in 1 month.   Eileen Stanford, NP 9/6/202410:09 AM

## 2023-03-10 DIAGNOSIS — D631 Anemia in chronic kidney disease: Secondary | ICD-10-CM | POA: Diagnosis not present

## 2023-03-10 DIAGNOSIS — I5042 Chronic combined systolic (congestive) and diastolic (congestive) heart failure: Secondary | ICD-10-CM | POA: Diagnosis not present

## 2023-03-10 DIAGNOSIS — E11621 Type 2 diabetes mellitus with foot ulcer: Secondary | ICD-10-CM | POA: Diagnosis not present

## 2023-03-10 DIAGNOSIS — I110 Hypertensive heart disease with heart failure: Secondary | ICD-10-CM | POA: Diagnosis not present

## 2023-03-10 DIAGNOSIS — L97428 Non-pressure chronic ulcer of left heel and midfoot with other specified severity: Secondary | ICD-10-CM | POA: Diagnosis not present

## 2023-03-10 DIAGNOSIS — S81811D Laceration without foreign body, right lower leg, subsequent encounter: Secondary | ICD-10-CM | POA: Diagnosis not present

## 2023-03-11 ENCOUNTER — Encounter (HOSPITAL_BASED_OUTPATIENT_CLINIC_OR_DEPARTMENT_OTHER): Payer: Medicare Other | Admitting: General Surgery

## 2023-03-11 ENCOUNTER — Ambulatory Visit: Payer: Medicare Other

## 2023-03-11 DIAGNOSIS — I130 Hypertensive heart and chronic kidney disease with heart failure and stage 1 through stage 4 chronic kidney disease, or unspecified chronic kidney disease: Secondary | ICD-10-CM | POA: Diagnosis not present

## 2023-03-11 DIAGNOSIS — L97812 Non-pressure chronic ulcer of other part of right lower leg with fat layer exposed: Secondary | ICD-10-CM | POA: Diagnosis not present

## 2023-03-11 DIAGNOSIS — N1832 Chronic kidney disease, stage 3b: Secondary | ICD-10-CM | POA: Diagnosis not present

## 2023-03-11 DIAGNOSIS — L97522 Non-pressure chronic ulcer of other part of left foot with fat layer exposed: Secondary | ICD-10-CM | POA: Diagnosis not present

## 2023-03-11 DIAGNOSIS — L97822 Non-pressure chronic ulcer of other part of left lower leg with fat layer exposed: Secondary | ICD-10-CM | POA: Diagnosis not present

## 2023-03-11 DIAGNOSIS — E11622 Type 2 diabetes mellitus with other skin ulcer: Secondary | ICD-10-CM | POA: Diagnosis not present

## 2023-03-11 DIAGNOSIS — S81802A Unspecified open wound, left lower leg, initial encounter: Secondary | ICD-10-CM | POA: Diagnosis not present

## 2023-03-11 DIAGNOSIS — S81801A Unspecified open wound, right lower leg, initial encounter: Secondary | ICD-10-CM | POA: Diagnosis not present

## 2023-03-11 NOTE — Progress Notes (Signed)
ARCHIT, KNOTH ([id number]) [id number] Page 1 of 13 Visit Report for 03/05/2023 Arrival Information Details Patient Name: Date of Service: Richard [name] [name] Richard L. 03/05/2023 8:00 A M Medical Record Number: [id number] Patient Account Number: [id number] Date of Birth/Sex: Treating RN: 1941/04/06 (82 y.o. Richard [name] [name] Primary Care Lynnix Schoneman: Seabron Spates Other Clinician: Referring Sibley Rolison: Treating Baudelia Schroepfer/Extender: Verner Mould in Treatment: 1 Visit Information History Since Last Visit All ordered tests and consults were completed: Yes Patient Arrived: Ambulatory Added or deleted any medications: No Arrival Time: 07:50 Any new allergies or adverse reactions: No Accompanied By: self Had a fall or experienced change in No Transfer Assistance: None activities of daily living that may affect Patient Has Alerts: Yes risk of falls: Patient Alerts: Patient on Blood Thinner Signs or symptoms of abuse/neglect since last visito No Eliquis Hospitalized since last visit: No Implantable device outside of the clinic excluding No cellular tissue based products placed in the center since last visit: Has Dressing in Place as Prescribed: Yes Pain Present Now: No Electronic Signature(s) Signed: 03/11/2023 3:48:27 PM By: Brenton Grills Entered By: Brenton Grills on 03/05/2023 04:53:04 -------------------------------------------------------------------------------- Compression Therapy Details Patient Name: Date of Service: Richard [name] [name], Richard [name] Presto Richard L. 03/05/2023 8:00 A M Medical Record Number: [id number] Patient Account Number: [id number] Date of Birth/Sex: Treating RN: 1941/03/02 (82 y.o. Richard [name] [name] Primary Care Jerald Villalona: Seabron Spates Other Clinician: Referring Effrey Davidow: Treating Jyair Kiraly/Extender: Verner Mould in Treatment: 1 Compression Therapy Performed for Wound Assessment: Wound #13  Right,Proximal,Anterior Lower Leg Performed By: Clinician Brenton Grills, RN Post Procedure Diagnosis Same as Pre-procedure Notes Scribed for Dr Lady Gary by Brenton Grills RN Electronic Signature(s) Signed: 03/11/2023 3:48:27 PM By: Brenton Grills Entered By: Brenton Grills on 03/05/2023 05:31:11 Richard [name] [name] ([id number]) [id number] Page 2 of 13 -------------------------------------------------------------------------------- Compression Therapy Details Patient Name: Date of Service: Richard [name] [name] Richard L. 03/05/2023 8:00 A M Medical Record Number: [id number] Patient Account Number: [id number] Date of Birth/Sex: Treating RN: 11/27/1940 (82 y.o. Richard [name] [name] Primary Care Tammee Thielke: Seabron Spates Other Clinician: Referring Tascha Casares: Treating Journii Nierman/Extender: Verner Mould in Treatment: 1 Compression Therapy Performed for Wound Assessment: Wound #14 Right,Anterior Lower Leg Performed By: Clinician Brenton Grills, RN Post Procedure Diagnosis Same as Pre-procedure Notes Scribed for Dr Lady Gary by Brenton Grills RN Electronic Signature(s) Signed: 03/11/2023 3:48:27 PM By: Brenton Grills Entered By: Brenton Grills on 03/05/2023 05:31:11 -------------------------------------------------------------------------------- Compression Therapy Details Patient Name: Date of Service: Richard [name] [name], Richard [name] Presto Richard L. 03/05/2023 8:00 A M Medical Record Number: [id number] Patient Account Number: [id number] Date of Birth/Sex: Treating RN: [birth year] (82 y.o. Richard [name] [name] Primary Care Adonna Horsley: Seabron Spates Other Clinician: Referring Richard Laffey: Treating Khaidyn Staebell/Extender: Verner Mould in Treatment: 1 Compression Therapy Performed for Wound Assessment: Wound #15 Right,Medial Lower Leg Performed By: Clinician Brenton Grills, RN Post Procedure Diagnosis Same as Pre-procedure Notes Scribed for Dr Lady Gary by Brenton Grills  RN Electronic Signature(s) Signed: 03/11/2023 3:48:27 PM By: Brenton Grills Entered By: Brenton Grills on 03/05/2023 05:31:11 -------------------------------------------------------------------------------- Encounter Discharge Information Details Patient Name: Date of Service: Richard [name] [name], Richard [name] Presto Richard L. 03/05/2023 8:00 A M Medical Record Number: [id number] Patient Account Number: [id number] Date of Birth/Sex: Treating RN: [birth year] (82 y.o. Richard [name] [name] Primary Care Jeno Calleros: Seabron Spates Other Clinician: Referring Cacie Gaskins: Treating Una Yeomans/Extender: Verner Mould in Treatment: 1 Richard, [name] ([id number]) [id number] Page 3 of 13 Encounter Discharge Information Items Post Procedure Vitals Discharge Condition: Stable Temperature (F): 98.2 Ambulatory Status:  Ambulatory Pulse (bpm): 78 Discharge Destination: Home Respiratory Rate (breaths/min): 18 Transportation: Private Auto Blood Pressure (mmHg): 128/65 Accompanied By: self Schedule Follow-up Appointment: Yes Clinical Summary of Care: Patient Declined Electronic Signature(s) Signed: 03/11/2023 3:48:27 PM By: Brenton Grills Entered By: Brenton Grills on 03/05/2023 05:44:14 -------------------------------------------------------------------------------- Lower Extremity Assessment Details Patient Name: Date of Service: Richard [name] [name], Richard [name] Presto Richard L. 03/05/2023 8:00 A M Medical Record Number: [id number] Patient Account Number: [id number] Date of Birth/Sex: Treating RN: 06/02/1941 (82 y.o. Richard [name] [name] Primary Care Kasidi Shanker: Seabron Spates Other Clinician: Referring Morell Mears: Treating Mancil Pfenning/Extender: Verner Mould in Treatment: 1 Edema Assessment Assessed: Richard [name] [name]: No] Franne [name]: No] [Left: Edema] [Right: :] Calf Left: Right: Point of Measurement: 35 cm From Medial Instep 43 cm 43 cm Ankle Left: Right: Point of Measurement: 11 cm From Medial  Instep 25 cm 25 cm Vascular Assessment Pulses: Dorsalis Pedis Palpable: [Left:Yes] [Right:Yes] Extremity colors, hair growth, and conditions: Extremity Color: [Left:Hyperpigmented] [Right:Hyperpigmented] Hair Growth on Extremity: [Left:No] [Right:No] Temperature of Extremity: [Left:Warm] [Right:Warm] Capillary Refill: [Left:< 3 seconds] [Right:< 3 seconds] Dependent Rubor: [Left:No No] [Right:No No] Toe Nail Assessment Left: Right: Thick: Yes Yes Discolored: Yes Yes Deformed: Yes Yes Improper Length and Hygiene: Yes Yes Electronic Signature(s) Signed: 03/11/2023 3:48:27 PM By: Brenton Grills Entered By: Brenton Grills on 03/05/2023 04:55:44 Richard [name] [name] ([id number]) [id number] Page 4 of 13 -------------------------------------------------------------------------------- Multi Wound Chart Details Patient Name: Date of Service: Richard [name] [name] Richard L. 03/05/2023 8:00 A M Medical Record Number: [id number] Patient Account Number: [id number] Date of Birth/Sex: Treating RN: May 26, 1941 (82 y.o. M) Primary Care Kamillah Didonato: Seabron Spates Other Clinician: Referring Kymberlie Brazeau: Treating Ayub Kirsh/Extender: Verner Mould in Treatment: 1 Vital Signs Height(in): 72 Capillary Blood Glucose(mg/dl): 644 Weight(lbs): 034 Pulse(bpm): 68 Body Mass Index(BMI): 31.2 Blood Pressure(mmHg): 114/70 Temperature(F): 97.6 Respiratory Rate(breaths/min): 18 [11:Photos:] Left, Lateral, Plantar Foot Right Knee Right, Proximal, Anterior Lower Leg Wound Location: Gradually Appeared Trauma Skin Tear/Laceration Wounding Event: Diabetic Wound/Ulcer of the Lower Abrasion Trauma, Other Primary Etiology: Extremity Cataracts, Anemia, Sleep Apnea, Cataracts, Anemia, Sleep Apnea, Cataracts, Anemia, Sleep Apnea, Comorbid History: Arrhythmia, Congestive Heart Failure, Arrhythmia, Congestive Heart Failure, Arrhythmia, Congestive Heart Failure, Coronary Artery  Disease, Coronary Artery Disease, Coronary Artery Disease, Hypertension, Type II Diabetes, Hypertension, Type II Diabetes, Hypertension, Type II Diabetes, Osteoarthritis, Neuropathy, ReceivedOsteoarthritis, Neuropathy, Received Osteoarthritis, Neuropathy, Received Chemotherapy Chemotherapy Chemotherapy 02/24/2022 02/05/2023 02/05/2023 Date Acquired: 1 1 1  Weeks of Treatment: Open Healed - Epithelialized Open Wound Status: No No No Wound Recurrence: 0.3x1.6x0.1 0x0x0 3.2x1.6x0.1 Measurements L x W x D (cm) 0.377 0 4.021 A (cm) : rea 0.038 0 0.402 Volume (cm) : 90.00% 100.00% 22.40% % Reduction in A rea: 89.90% 100.00% 22.40% % Reduction in Volume: Grade 1 Full Thickness Without Exposed Full Thickness Without Exposed Classification: Support Structures Support Structures None Present Medium Medium Exudate A mount: N/A Serosanguineous Serosanguineous Exudate Type: N/A red, brown red, brown Exudate Color: Small (1-33%) Small (1-33%) Medium (34-66%) Granulation A mount: Red Red Red Granulation Quality: Large (67-100%) Large (67-100%) Medium (34-66%) Necrotic A mount: Eschar Eschar Eschar, Adherent Slough Necrotic Tissue: Fat Layer (Subcutaneous Tissue): Yes Fat Layer (Subcutaneous Tissue): Yes Fat Layer (Subcutaneous Tissue): Yes Exposed Structures: Fascia: No Tendon: No Muscle: No Joint: No Bone: No N/A Small (1-33%) Small (1-33%) Epithelialization: Debridement - Selective/Open Wound N/A Debridement - Selective/Open Wound Debridement: Pre-procedure Verification/Time Out N/A N/A 08:19 Taken: Lidocaine 4% T opical Solution N/A Lidocaine 4% Topical Solution Pain Control: Callus, Slough N/A Slough Tissue Debrided: Skin/Epidermis N/A Non-Viable  Tissue Level: 0.38 N/A 4.02 Debridement A (sq cm): rea Curette N/A Curette Instrument: Minimum N/A Minimum Bleeding: Pressure N/A Pressure Hemostasis A chieved: Procedure was tolerated well N/A Procedure was tolerated  well Debridement Treatment Response: 0.3x1.6x0.1 N/A 3.2x1.6x0.1 Post Debridement Measurements L x W x D (cm) 0.038 N/A 0.402 Post Debridement Volume: (cm) Callus: Yes Scarring: Yes Scarring: Yes Periwound Skin TextureFULLER, Richard [name] [name] ([id number]) [id number] Page 5 of 13 No Abnormalities Noted No Abnormalities Noted No Abnormalities Noted Periwound Skin Moisture: No Abnormalities Noted No Abnormalities Noted No Abnormalities Noted Periwound Skin Color: No Abnormality No Abnormality No Abnormality Temperature: Debridement N/A Compression Therapy Procedures Performed: Debridement Wound Number: 14 15 N/A Photos: N/A Right, Anterior Lower Leg Right, Medial Lower Leg N/A Wound Location: Skin T ear/Laceration Skin Tear/Laceration N/A Wounding Event: Trauma, Other Trauma, Other N/A Primary Etiology: Cataracts, Anemia, Sleep Apnea, Cataracts, Anemia, Sleep Apnea, N/A Comorbid History: Arrhythmia, Congestive Heart Failure, Arrhythmia, Congestive Heart Failure, Coronary Artery Disease, Coronary Artery Disease, Hypertension, Type II Diabetes, Hypertension, Type II Diabetes, Osteoarthritis, Neuropathy, Received Osteoarthritis, Neuropathy, Received Chemotherapy Chemotherapy 02/05/2023 02/05/2023 N/A Date Acquired: 1 1 N/A Weeks of Treatment: Open Open N/A Wound Status: No No N/A Wound Recurrence: 1.2x0.9x0.1 0.5x0.4x0.1 N/A Measurements L x W x D (cm) 0.848 0.157 N/A A (cm) : rea 0.085 0.016 N/A Volume (cm) : 71.20% 52.40% N/A % Reduction in A rea: 71.20% 51.50% N/A % Reduction in Volume: Full Thickness Without Exposed Full Thickness Without Exposed N/A Classification: Support Structures Support Structures Medium Medium N/A Exudate A mount: Serosanguineous Serosanguineous N/A Exudate Type: red, brown red, brown N/A Exudate Color: Small (1-33%) Medium (34-66%) N/A Granulation A mount: Red Red N/A Granulation Quality: Large (67-100%) Medium  (34-66%) N/A Necrotic A mount: Eschar Eschar N/A Necrotic Tissue: Fat Layer (Subcutaneous Tissue): Yes Fat Layer (Subcutaneous Tissue): Yes N/A Exposed Structures: Fascia: No Tendon: No Muscle: No Joint: No Bone: No Small (1-33%) Small (1-33%) N/A Epithelialization: Debridement - Selective/Open Wound Debridement - Selective/Open Wound N/A Debridement: Pre-procedure Verification/Time Out 08:19 08:26 N/A Taken: Lidocaine 4% Topical Solution Lidocaine 4% Topical Solution N/A Pain Control: Necrotic/Eschar Necrotic/Eschar, Slough N/A Tissue Debrided: Non-Viable Tissue Non-Viable Tissue N/A Level: 0.85 0.16 N/A Debridement A (sq cm): rea Curette Curette N/A Instrument: Minimum Minimum N/A Bleeding: Pressure Pressure N/A Hemostasis A chieved: Procedure was tolerated well Procedure was tolerated well N/A Debridement Treatment Response: 1.2x0.9x0.1 0.5x0.4x0.1 N/A Post Debridement Measurements L x W x D (cm) 0.085 0.016 N/A Post Debridement Volume: (cm) Scarring: Yes No Abnormalities Noted N/A Periwound Skin Texture: No Abnormalities Noted No Abnormalities Noted N/A Periwound Skin Moisture: No Abnormalities Noted No Abnormalities Noted N/A Periwound Skin Color: No Abnormality No Abnormality N/A Temperature: Compression Therapy Compression Therapy N/A Procedures Performed: Debridement Debridement Treatment Notes Electronic Signature(s) Signed: 03/05/2023 8:39:47 AM By: Duanne Guess MD FACS Entered By: Duanne Guess on 03/05/2023 05:39:47 Richard [name] [name] ([id number]) [id number] Page 6 of 13 -------------------------------------------------------------------------------- Multi-Disciplinary Care Plan Details Patient Name: Date of Service: Richard [name] [name] Richard L. 03/05/2023 8:00 A M Medical Record Number: [id number] Patient Account Number: [id number] Date of Birth/Sex: Treating RN: [birth year] (82 y.o. Richard [name] [name] Primary Care Yareliz Thorstenson:  Seabron Spates Other Clinician: Referring Floy Angert: Treating Brando Taves/Extender: Verner Mould in Treatment: 1 Active Inactive Wound/Skin Impairment Nursing Diagnoses: Impaired tissue integrity Goals: Patient/caregiver will verbalize understanding of skin care regimen Date Initiated: 02/25/2023 Target Resolution Date: 05/31/2023 Goal Status: Active Interventions: Assess ulceration(s) every visit Treatment Activities: Skin care regimen initiated : 02/25/2023 Notes: Electronic  Signature(s) Signed: 03/11/2023 3:48:27 PM By: Brenton Grills Entered By: Brenton Grills on 03/05/2023 05:13:32 -------------------------------------------------------------------------------- Pain Assessment Details Patient Name: Date of Service: Richard [name] [name], Richard [name] Presto Richard L. 03/05/2023 8:00 A M Medical Record Number: [id number] Patient Account Number: [id number] Date of Birth/Sex: Treating RN: 1941/04/24 (82 y.o. Richard [name] [name] Primary Care Tahisha Hakim: Seabron Spates Other Clinician: Referring Jalie Eiland: Treating Briceson Broadwater/Extender: Verner Mould in Treatment: 1 Active Problems Location of Pain Severity and Description of Pain Patient Has Paino No Site Locations [place] ([id number]) [id number] Page 7 of 13 Pain Management and Medication Current Pain Management: Electronic Signature(s) Signed: 03/11/2023 3:48:27 PM By: Brenton Grills Entered By: Brenton Grills on 03/05/2023 04:55:25 -------------------------------------------------------------------------------- Patient/Caregiver Education Details Patient Name: Date of Service: Billey Chang 9/4/2024andnbsp8:00 A M Medical Record Number: [id number] Patient Account Number: [id number] Date of Birth/Gender: Treating RN: 1940/09/26 (82 y.o. Richard [name] [name] Primary Care Physician: Seabron Spates Other Clinician: Referring Physician: Treating  Physician/Extender: Verner Mould in Treatment: 1 Education Assessment Education Provided To: Patient Education Topics Provided Wound/Skin Impairment: Methods: Explain/Verbal Responses: State content correctly Electronic Signature(s) Signed: 03/11/2023 3:48:27 PM By: Brenton Grills Entered By: Brenton Grills on 03/05/2023 05:16:39 -------------------------------------------------------------------------------- Wound Assessment Details Patient Name: Date of Service: Richard [name] [name], Richard [name] Presto Richard L. 03/05/2023 8:00 A M Medical Record Number: [id number] Patient Account Number: [id number] Date of Birth/Sex: Treating RN: 02/16/1941 (82 y.o. Richard [name] [name] Primary Care Corrina Steffensen: Seabron Spates Other Clinician: Referring Yittel Emrich: Treating Dalyla Chui/Extender: Quanta, Hafer ([id number]) [id number] Page 8 of 13 Weeks in Treatment: 1 Wound Status Wound Number: 11 Primary Diabetic Wound/Ulcer of the Lower Extremity Etiology: Wound Location: Left, Lateral, Plantar Foot Wound Open Wounding Event: Gradually Appeared Status: Date Acquired: 02/24/2022 Comorbid Cataracts, Anemia, Sleep Apnea, Arrhythmia, Congestive Heart Weeks Of Treatment: 1 History: Failure, Coronary Artery Disease, Hypertension, Type II Diabetes, Clustered Wound: No Osteoarthritis, Neuropathy, Received Chemotherapy Photos Wound Measurements Length: (cm) 0.3 Width: (cm) 1.6 Depth: (cm) 0.1 Area: (cm) 0.377 Volume: (cm) 0.038 % Reduction in Area: 90% % Reduction in Volume: 89.9% Tunneling: No Undermining: No Wound Description Classification: Grade 1 Exudate Amount: None Present Foul Odor After Cleansing: No Slough/Fibrino Yes Wound Bed Granulation Amount: Small (1-33%) Exposed Structure Granulation Quality: Red Fat Layer (Subcutaneous Tissue) Exposed: Yes Necrotic Amount: Large (67-100%) Necrotic Quality: Eschar Periwound Skin  Texture Texture Color No Abnormalities Noted: No No Abnormalities Noted: Yes Callus: Yes Temperature / Pain Temperature: No Abnormality Moisture No Abnormalities Noted: Yes Electronic Signature(s) Signed: 03/11/2023 3:48:27 PM By: Brenton Grills Entered By: Brenton Grills on 03/05/2023 05:09:07 -------------------------------------------------------------------------------- Wound Assessment Details Patient Name: Date of Service: Richard [name] [name], Richard [name] Presto Richard L. 03/05/2023 8:00 A M Medical Record Number: [id number] Patient Account Number: [id number] Date of Birth/Sex: Treating RN: 1940-10-15 (82 y.o. Richard [name] [name] Primary Care Nero Sawatzky: Seabron Spates Other Clinician: Referring Shedric Fredericks: Treating Aizah Gehlhausen/Extender: Verner Mould in Treatment: 1 Wound Status Wound Number: 12 Primary Abrasion Etiology: DIESEL, ZUEHLKE ([id number]) [id number].pdf Page 9 of 13 Etiology: Wound Location: Right Knee Wound Healed - Epithelialized Wounding Event: Trauma Status: Date Acquired: 02/05/2023 Comorbid Cataracts, Anemia, Sleep Apnea, Arrhythmia, Congestive Heart Weeks Of Treatment: 1 History: Failure, Coronary Artery Disease, Hypertension, Type II Diabetes, Clustered Wound: No Osteoarthritis, Neuropathy, Received Chemotherapy Photos Wound Measurements Length: (cm) Width: (cm) Depth: (cm) Area: (cm) Volume: (cm) 0 % Reduction in Area: 100% 0 % Reduction in Volume: 100% 0 Epithelialization: Small (1-33%) 0 Tunneling: No 0 Undermining: No Wound Description Classification:  Full Thickness Without Exposed Suppor Exudate Amount: Medium Exudate Type: Serosanguineous Exudate Color: red, brown t Structures Foul Odor After Cleansing: No Slough/Fibrino Yes Wound Bed Granulation Amount: Small (1-33%) Exposed Structure Granulation Quality: Red Fascia Exposed: No Necrotic Amount: Large (67-100%) Fat Layer (Subcutaneous Tissue) Exposed: Yes Necrotic  Quality: Eschar Tendon Exposed: No Muscle Exposed: No Joint Exposed: No Bone Exposed: No Periwound Skin Texture Texture Color No Abnormalities Noted: No No Abnormalities Noted: Yes Scarring: Yes Temperature / Pain Temperature: No Abnormality Moisture No Abnormalities Noted: Yes Treatment Notes Wound #12 (Knee) Wound Laterality: Right Cleanser Peri-Wound Care Topical Primary Dressing Secondary Dressing Secured With Compression Wrap Compression Stockings Add-Ons Electronic Signature(s) Signed: 03/11/2023 3:48:27 PM By: Brenton Grills Entered By: Brenton Grills on 03/05/2023 05:28:18 Richard [name] [name] ([id number]) [id number] Page 10 of 13 -------------------------------------------------------------------------------- Wound Assessment Details Patient Name: Date of Service: Richard [name] [name] Richard L. 03/05/2023 8:00 A M Medical Record Number: [id number] Patient Account Number: [id number] Date of Birth/Sex: Treating RN: [birth year] (82 y.o. Richard [name] [name] Primary Care Andora Krull: Seabron Spates Other Clinician: Referring Capri Veals: Treating Lanette Ell/Extender: Verner Mould in Treatment: 1 Wound Status Wound Number: 13 Primary Trauma, Other Etiology: Wound Location: Right, Proximal, Anterior Lower Leg Wound Open Wounding Event: Skin Tear/Laceration Status: Date Acquired: 02/05/2023 Comorbid Cataracts, Anemia, Sleep Apnea, Arrhythmia, Congestive Heart Weeks Of Treatment: 1 History: Failure, Coronary Artery Disease, Hypertension, Type II Diabetes, Clustered Wound: No Osteoarthritis, Neuropathy, Received Chemotherapy Photos Wound Measurements Length: (cm) 3.2 Width: (cm) 1.6 Depth: (cm) 0.1 Area: (cm) 4.021 Volume: (cm) 0.402 % Reduction in Area: 22.4% % Reduction in Volume: 22.4% Epithelialization: Small (1-33%) Tunneling: No Undermining: No Wound Description Classification: Full Thickness Without Exposed  Suppor Exudate Amount: Medium Exudate Type: Serosanguineous Exudate Color: red, brown t Structures Foul Odor After Cleansing: No Slough/Fibrino Yes Wound Bed Granulation Amount: Medium (34-66%) Exposed Structure Granulation Quality: Red Fat Layer (Subcutaneous Tissue) Exposed: Yes Necrotic Amount: Medium (34-66%) Necrotic Quality: Eschar, Adherent Slough Periwound Skin Texture Texture Color No Abnormalities Noted: No No Abnormalities Noted: Yes Scarring: Yes Temperature / Pain Temperature: No Abnormality Moisture No Abnormalities Noted: Yes Electronic Signature(s) Signed: 03/11/2023 3:48:27 PM By: Brenton Grills Entered By: Brenton Grills on 03/05/2023 05:10:15 Richard [name] [name] ([id number]) [id number] Page 11 of 13 -------------------------------------------------------------------------------- Wound Assessment Details Patient Name: Date of Service: Richard [name] [name] Richard L. 03/05/2023 8:00 A M Medical Record Number: [id number] Patient Account Number: [id number] Date of Birth/Sex: Treating RN: 1941-03-29 (82 y.o. Richard [name] [name] Primary Care Amadou Katzenstein: Seabron Spates Other Clinician: Referring Oaklen Thiam: Treating Cristo Ausburn/Extender: Verner Mould in Treatment: 1 Wound Status Wound Number: 14 Primary Trauma, Other Etiology: Wound Location: Right, Anterior Lower Leg Wound Open Wounding Event: Skin Tear/Laceration Status: Date Acquired: 02/05/2023 Comorbid Cataracts, Anemia, Sleep Apnea, Arrhythmia, Congestive Heart Weeks Of Treatment: 1 History: Failure, Coronary Artery Disease, Hypertension, Type II Diabetes, Clustered Wound: No Osteoarthritis, Neuropathy, Received Chemotherapy Photos Wound Measurements Length: (cm) 1.2 Width: (cm) 0.9 Depth: (cm) 0.1 Area: (cm) 0.848 Volume: (cm) 0.085 % Reduction in Area: 71.2% % Reduction in Volume: 71.2% Epithelialization: Small (1-33%) Tunneling: No Undermining: No Wound  Description Classification: Full Thickness Without Exposed Support Structures Exudate Amount: Medium Exudate Type: Serosanguineous Exudate Color: red, brown Foul Odor After Cleansing: No Slough/Fibrino Yes Wound Bed Granulation Amount: Small (1-33%) Exposed Structure Granulation Quality: Red Fascia Exposed: No Necrotic Amount: Large (67-100%) Fat Layer (Subcutaneous Tissue) Exposed: Yes Necrotic Quality: Eschar Tendon Exposed: No Muscle Exposed: No Joint Exposed: No Bone Exposed: No Periwound Skin  Texture Texture Color No Abnormalities Noted: No No Abnormalities Noted: Yes Scarring: Yes Temperature / Pain Temperature: No Abnormality Moisture No Abnormalities Noted: Yes Electronic Signature(s) Signed: 03/11/2023 3:48:27 PM By: Brenton Grills Entered By: Brenton Grills on 03/05/2023 05:10:50 Richard [name] [name] ([id number]) [id number] Page 12 of 13 -------------------------------------------------------------------------------- Wound Assessment Details Patient Name: Date of Service: Richard [name] [name] Richard L. 03/05/2023 8:00 A M Medical Record Number: [id number] Patient Account Number: [id number] Date of Birth/Sex: Treating RN: 1941/02/21 (82 y.o. Richard [name] [name] Primary Care Jhoselin Crume: Seabron Spates Other Clinician: Referring Sloane Palmer: Treating Najmah Carradine/Extender: Verner Mould in Treatment: 1 Wound Status Wound Number: 15 Primary Trauma, Other Etiology: Wound Location: Right, Medial Lower Leg Wound Open Wounding Event: Skin Tear/Laceration Status: Date Acquired: 02/05/2023 Comorbid Cataracts, Anemia, Sleep Apnea, Arrhythmia, Congestive Heart Weeks Of Treatment: 1 History: Failure, Coronary Artery Disease, Hypertension, Type II Diabetes, Clustered Wound: No Osteoarthritis, Neuropathy, Received Chemotherapy Photos Wound Measurements Length: (cm) 0.5 Width: (cm) 0.4 Depth: (cm) 0.1 Area: (cm) 0.157 Volume: (cm)  0.016 % Reduction in Area: 52.4% % Reduction in Volume: 51.5% Epithelialization: Small (1-33%) Tunneling: No Undermining: No Wound Description Classification: Full Thickness Without Exposed Suppor Exudate Amount: Medium Exudate Type: Serosanguineous Exudate Color: red, brown t Structures Foul Odor After Cleansing: No Slough/Fibrino Yes Wound Bed Granulation Amount: Medium (34-66%) Exposed Structure Granulation Quality: Red Fat Layer (Subcutaneous Tissue) Exposed: Yes Necrotic Amount: Medium (34-66%) Necrotic Quality: Eschar Periwound Skin Texture Texture Color No Abnormalities Noted: Yes No Abnormalities Noted: Yes Moisture Temperature / Pain No Abnormalities Noted: Yes Temperature: No Abnormality Electronic Signature(s) Signed: 03/11/2023 3:48:27 PM By: Brenton Grills Entered By: Brenton Grills on 03/05/2023 05:11:19 Richard [name] [name] ([id number]) [id number] Page 13 of 13 -------------------------------------------------------------------------------- Vitals Details Patient Name: Date of Service: Richard [name] [name] Richard L. 03/05/2023 8:00 A M Medical Record Number: [id number] Patient Account Number: [id number] Date of Birth/Sex: Treating RN: 1941-05-12 (82 y.o. Richard [name] [name] Primary Care Shigeo Baugh: Seabron Spates Other Clinician: Referring Desiray Orchard: Treating Kenndra Morris/Extender: Verner Mould in Treatment: 1 Vital Signs Time Taken: 07:53 Temperature (F): 97.6 Height (in): 72 Pulse (bpm): 68 Weight (lbs): 230 Respiratory Rate (breaths/min): 18 Body Mass Index (BMI): 31.2 Blood Pressure (mmHg): 114/70 Capillary Blood Glucose (mg/dl): 062 Reference Range: 80 - 120 mg / dl Electronic Signature(s) Signed: 03/11/2023 3:48:27 PM By: Brenton Grills Entered By: Brenton Grills on 03/05/2023 04:53:58

## 2023-03-11 NOTE — Progress Notes (Signed)
DIETRICK, KOZINSKI ([id number]) [id number] Page 1 of 14 Visit Report for 03/05/2023 Chief Complaint Document Details Patient Name: Date of Service: Richard Alert [name] 03/05/2023 8:00 A M Medical Record Number: [id number] Patient Account Number: [id number] Date of Birth/Sex: Treating RN: 1941-05-04 (82 y.o. M) Primary Care Provider: Seabron Spates Other Clinician: Referring Provider: Treating Provider/Extender: Verner Mould in Treatment: 1 Information Obtained from: Patient Chief Complaint 01/12/2021; Wounds to his left upper extremity and bilateral lower extremities. 05/14/2022; bilateral lower extremity wounds, right plantar foot wound 09/05/2022; bilateral lower extremity wounds, left thumb wound 02/25/2023: left foot DFU and right knee and RLE wounds Electronic Signature(s) Signed: 03/05/2023 8:40:00 AM By: Duanne Guess MD FACS Entered By: Duanne Guess on 03/05/2023 05:40:00 -------------------------------------------------------------------------------- Debridement Details Patient Name: Date of Service: Richard [name], [name] 03/05/2023 8:00 A M Medical Record Number: [id number] Patient Account Number: [id number] Date of Birth/Sex: Treating RN: [birth year] (82 y.o. Yates Decamp Primary Care Provider: Seabron Spates Other Clinician: Referring Provider: Treating Provider/Extender: Verner Mould in Treatment: 1 Debridement Performed for Assessment: Wound #11 Left,Lateral,Plantar Foot Performed By: Physician Duanne Guess, MD Debridement Type: Debridement Severity of Tissue Pre Debridement: Fat layer exposed Level of Consciousness (Pre-procedure): Awake and Alert Start Time: 08:20 Pain Control: Lidocaine 4% Topical Solution Percent of Wound Bed Debrided: 100% T Area Debrided (cm): otal 0.38 Tissue and other material debrided: Non-Viable, Callus, Slough, Skin: Dermis , Skin: Epidermis,  Slough Level: Skin/Epidermis Debridement Description: Selective/Open Wound Instrument: Curette Bleeding: Minimum Hemostasis Achieved: Pressure Response to Treatment: Procedure was tolerated well Level of Consciousness (Post- Awake and Alert procedure): Post Debridement Measurements of Total Wound Length: (cm) 0.3 Width: (cm) 1.6 Depth: (cm) 0.1 Volume: (cm) 0.038 Character of Wound/Ulcer Post Debridement: Improved Severity of Tissue Post Debridement: Fat layer exposed LAQUAVIOUS, KARPOWICZ ([id number]) [id number] Page 2 of 14 Post Procedure Diagnosis Same as Pre-procedure Notes Scribed for Dr Lady Gary by Brenton Grills RN Electronic Signature(s) Signed: 03/05/2023 1:37:38 PM By: Duanne Guess MD FACS Signed: 03/11/2023 3:48:27 PM By: Brenton Grills Entered By: Brenton Grills on 03/05/2023 05:23:12 -------------------------------------------------------------------------------- Debridement Details Patient Name: Date of Service: Richard [name], [name] 03/05/2023 8:00 A M Medical Record Number: [id number] Patient Account Number: [id number] Date of Birth/Sex: Treating RN: 1941-01-16 (82 y.o. Yates Decamp Primary Care Provider: Seabron Spates Other Clinician: Referring Provider: Treating Provider/Extender: Verner Mould in Treatment: 1 Debridement Performed for Assessment: Wound #15 Right,Medial Lower Leg Performed By: Physician Duanne Guess, MD Debridement Type: Debridement Level of Consciousness (Pre-procedure): Awake and Alert Pre-procedure Verification/Time Out Yes - 08:26 Taken: Start Time: 08:28 Pain Control: Lidocaine 4% Topical Solution Percent of Wound Bed Debrided: 100% T Area Debrided (cm): otal 0.16 Tissue and other material debrided: Non-Viable, Eschar, Slough, Slough Level: Non-Viable Tissue Debridement Description: Selective/Open Wound Instrument: Curette Bleeding: Minimum Hemostasis Achieved:  Pressure Response to Treatment: Procedure was tolerated well Level of Consciousness (Post- Awake and Alert procedure): Post Debridement Measurements of Total Wound Length: (cm) 0.5 Width: (cm) 0.4 Depth: (cm) 0.1 Volume: (cm) 0.016 Character of Wound/Ulcer Post Debridement: Improved Post Procedure Diagnosis Same as Pre-procedure Notes Scribed for Dr Lady Gary by Brenton Grills RN Electronic Signature(s) Signed: 03/05/2023 1:37:38 PM By: Duanne Guess MD FACS Signed: 03/11/2023 3:48:27 PM By: Brenton Grills Entered By: Brenton Grills on 03/05/2023 05:27:48 Richard [name] ([id number]) [id number] Page 3 of 14 -------------------------------------------------------------------------------- Debridement Details Patient Name: Date of Service: Richard [name], Richard [name] 03/05/2023 8:00 A M Medical Record  Number: [id number] Patient Account Number: [id number] Date of Birth/Sex: Treating RN: July 05, 1940 (82 y.o. Yates Decamp Primary Care Provider: Seabron Spates Other Clinician: Referring Provider: Treating Provider/Extender: Verner Mould in Treatment: 1 Debridement Performed for Assessment: Wound #13 Right,Proximal,Anterior Lower Leg Performed By: Physician Duanne Guess, MD Debridement Type: Debridement Level of Consciousness (Pre-procedure): Awake and Alert Pre-procedure Verification/Time Out Yes - 08:19 Taken: Start Time: 08:20 Pain Control: Lidocaine 4% T opical Solution Percent of Wound Bed Debrided: 100% T Area Debrided (cm): otal 4.02 Tissue and other material debrided: Non-Viable, Slough, Slough Level: Non-Viable Tissue Debridement Description: Selective/Open Wound Instrument: Curette Bleeding: Minimum Hemostasis Achieved: Pressure Response to Treatment: Procedure was tolerated well Level of Consciousness (Post- Awake and Alert procedure): Post Debridement Measurements of Total Wound Length: (cm) 3.2 Width: (cm)  1.6 Depth: (cm) 0.1 Volume: (cm) 0.402 Character of Wound/Ulcer Post Debridement: Improved Post Procedure Diagnosis Same as Pre-procedure Notes Scribed for Dr Lady Gary by Brenton Grills RN Electronic Signature(s) Signed: 03/05/2023 1:37:38 PM By: Duanne Guess MD FACS Signed: 03/11/2023 3:48:27 PM By: Brenton Grills Entered By: Brenton Grills on 03/05/2023 05:29:37 -------------------------------------------------------------------------------- Debridement Details Patient Name: Date of Service: Richard [name], [name] 03/05/2023 8:00 A M Medical Record Number: [id number] Patient Account Number: [id number] Date of Birth/Sex: Treating RN: March 11, 1941 (82 y.o. Yates Decamp Primary Care Provider: Seabron Spates Other Clinician: Referring Provider: Treating Provider/Extender: Verner Mould in Treatment: 1 Debridement Performed for Assessment: Wound #14 Right,Anterior Lower Leg Performed By: Physician Duanne Guess, MD Debridement Type: Debridement Level of Consciousness (Pre-procedure): Awake and Alert Pre-procedure Verification/Time Out Yes - 08:19 Taken: Start Time: 08:20 Pain Control: Lidocaine 4% Topical Solution Percent of Wound Bed Debrided: 100% T Area Debrided (cm): otal 0.85 Tissue and other material debrided: Non-Viable, Eschar Level: Non-Viable Tissue Debridement Description: Selective/Open Wound Richard [name], Richard [name] ([id number]) [id number] Page 4 of 14 Instrument: Curette Bleeding: Minimum Hemostasis Achieved: Pressure Response to Treatment: Procedure was tolerated well Level of Consciousness (Post- Awake and Alert procedure): Post Debridement Measurements of Total Wound Length: (cm) 1.2 Width: (cm) 0.9 Depth: (cm) 0.1 Volume: (cm) 0.085 Character of Wound/Ulcer Post Debridement: Improved Post Procedure Diagnosis Same as Pre-procedure Notes Scribed for Dr Lady Gary by Brenton Grills RN Electronic  Signature(s) Signed: 03/05/2023 1:37:38 PM By: Duanne Guess MD FACS Signed: 03/11/2023 3:48:27 PM By: Brenton Grills Entered By: Brenton Grills on 03/05/2023 05:30:42 -------------------------------------------------------------------------------- HPI Details Patient Name: Date of Service: Richard [name], Richard [name] 03/05/2023 8:00 A M Medical Record Number: [id number] Patient Account Number: [id number] Date of Birth/Sex: Treating RN: February 17, 1941 (82 y.o. M) Primary Care Provider: Seabron Spates Other Clinician: Referring Provider: Treating Provider/Extender: Verner Mould in Treatment: 1 History of Present Illness HPI Description: Admission 7/15 Richard [name] is an 82 year old male with a past medical history of type 2 diabetes on oral agents, diffuse large B-cell lymphoma, and coronary artery disease status post CABG that presents to the clinic for wounds to his left upper extremity and bilateral lower extremities. He states that he went to urgent care to be evaluated for a fever. And he was instructed to go to the ED to be further evaluated for possible sepsis. Unfortunately he passed out in his car After his appointment and was not discovered for another 3 hours. He was eventually discovered and EMS had to take him out of his vehicle. This is when he developed abrasions to his left arm and legs. He has been using Xeroform to his wound beds  every other day. He reports improvement to all the wounds except for the one on the back of his right leg. He denies pain or signs of infection. 7/22; patient presents for 1 week follow-up. He has no issues or complaints today. He has tolerated the compression wrap well on his right lower extremity. He has been able to do the dressing changes without issues to his left leg and left arm. His left arm wound has healed. He denies signs of infection. 7/29; patient presents for 1 week follow-up. He reports that his left leg wound  is healed. He tolerated the compression wrap well on his right leg. He has no issues or complaints today. He denies signs of infection. 8/4; patient presents for 1 week follow-up. He continues to see improvement in wound healing to his right lower extremity. He has no issues or complaints today. He denies signs of infection. He does state that he went to the dermatologist and had some lesions removed on his face and left leg. He has been keeping these covered with a Band-Aid. 8/19; patient presents for follow-up. He is tolerated the compression wrap well. He has no issues or complaints today. He now only has 1 remaining wound. 9/1; patient presents for 1 week follow-up. He has tolerated the compression wrap well and has no issues or complaints today. He has been golfing 3 times weekly. He is in good spirits today. 9/15; patient presents for follow-up. He has no issues or complaints today. The wound is closed. Readmission 05/14/2022 Richard [name] is an 82 year old male with a past medical history of uncontrolled type 2 diabetes on oral agents, diffuse large B-cell lymphoma and coronary artery disease status post CABG that presents the clinic for 3 wounds. He developed a right knee wound when he fell out of his golf cart 3 weeks ago. He developed increased warmth and erythema to the right leg. While he was still waiting in the ED on 04/24/2022 for this issue he dropped his phone and created a wound to his left thigh. He has been using Xeroform to these areas. He ultimately required admission from the ED for right lower extremity cellulitis and was treated with Zyvox and Keflex. He also has a right plantar foot wound that started after having callus debridement by his podiatrist. He has had this wound for at least 6 weeks. For the foot wound he has not been keeping it covered and walks around barefoot. He has peripheral neuropathy However states that the wound site is painful. He denies any purulent  drainage. He has swelling to this area. 11/21; patient presents for follow-up. He had an x-ray done at last clinic visit to the right foot that showed no focal bony erosion suggesting osteomyelitis. He AHMIR, DELANGEL ([id number]) [id number] Page 5 of 14 completed his course of levofloxacin. He has been using his front offloading shoe. He has been using Xeroform to the knee wounds. He has no issues or complaints today. 11/28; patient presents for follow-up. His right knee wound has epithelialized. His right plantar foot wound remains closed. He has no issues or complaints today. 09/05/2022 Richard [name] is an 82 year old male with a past medical history of type 2 diabetes on oral agents, diffuse large B-cell lymphoma and coronary artery disease status post CABG that presents the clinic for a 2-week history of wounds to his lower extremities bilaterally and his left thumb. He developed the wounds to his legs after his sister's dog jumped on him. He has  been using Vaseline to the areas. He cut his thumb with a knife while cooking. He states he went to the ED and a used Dermabond on this. He currently denies signs of infection. 3/14; patient presents for follow-up. He has been using Xeroform to his lower extremities bilaterally daily. He has been using antibiotic ointment to his left thumb. All wounds have shown improvement in healing. He has no issues or complaints today. 3/28; patient presents for follow-up. We have been using antibiotic ointment to the left thumb and Xeroform to the knees bilaterally. His wounds have healed. READMISSION 02/25/2023 *** ABIs in clinic today-- right: 1.15 left: 1.12.*** He returns to clinic today with a new left foot ulcer. He says he has had a callus there for a while that the podiatrist has been periodically debriding, but it has become more painful. There is discoloration underneath a thick layer of callus suggestive of an open wound.  He also struck his right leg on the car door and has multiple skin tears of varying sizes for a total of 4 wounds. Most recent hemoglobin A1c 7.1%. 03/05/2023: The wound on his right knee is healed. The right mid tibial wound is nearly healed under a layer of eschar. The more proximal and distal anterior tibial wounds are smaller. The wound on his left foot appears to have gotten some moisture under the perimeter dry skin and callus, but fortunately this has not resulted in further tissue breakdown. This wound is stable. Electronic Signature(s) Signed: 03/05/2023 8:41:11 AM By: Duanne Guess MD FACS Entered By: Duanne Guess on 03/05/2023 05:41:11 -------------------------------------------------------------------------------- Physical Exam Details Patient Name: Date of Service: Richard [name], [name] 03/05/2023 8:00 A M Medical Record Number: [id number] Patient Account Number: [id number] Date of Birth/Sex: Treating RN: 18-Apr-1941 (82 y.o. M) Primary Care Provider: Seabron Spates Other Clinician: Referring Provider: Treating Provider/Extender: Verner Mould in Treatment: 1 Constitutional . . . . no acute distress. Respiratory Normal work of breathing on room air. Notes 03/05/2023: The wound on his right knee is healed. The right mid tibial wound is nearly healed under a layer of eschar. The more proximal and distal anterior tibial wounds are smaller. The wound on his left foot appears to have gotten some moisture under the perimeter dry skin and callus, but fortunately this has not resulted in further tissue breakdown. This wound is stable. Electronic Signature(s) Signed: 03/05/2023 8:42:58 AM By: Duanne Guess MD FACS Entered By: Duanne Guess on 03/05/2023 05:42:58 -------------------------------------------------------------------------------- Physician Orders Details Patient Name: Date of Service: Richard [name], [name] 03/05/2023 8:00 A M Medical Record  Number: [id number] Patient Account Number: [id number] Date of Birth/Sex: Treating RN: 1940/11/26 (82 y.o. Yates Decamp Primary Care Provider: Seabron Spates Other Clinician: Referring Provider: Treating Provider/Extender: Keo, Sebastiani ([id number]) [id number] Page 6 of 14 Weeks in Treatment: 1 Verbal / Phone Orders: No Diagnosis Coding ICD-10 Coding Code Description [phone] Non-pressure chronic ulcer of other part of left foot with fat layer exposed L97.812 Non-pressure chronic ulcer of other part of right lower leg with fat layer exposed E11.621 Type 2 diabetes mellitus with foot ulcer E11.622 Type 2 diabetes mellitus with other skin ulcer I50.42 Chronic combined systolic (congestive) and diastolic (congestive) heart failure N18.32 Chronic kidney disease, stage 3b R60.0 Localized edema Follow-up Appointments ppointment in 1 week. - Dr. Lady Gary Room 3 - please make appt Return A ppointment in 2 weeks. - Please ask front desk to schedule an appointment Return A  Return appointment in 3 weeks. - Please ask front desk to schedule an appointment Return appointment in 1 month. - Please ask front desk to schedule an appointment Anesthetic (In clinic) Topical Lidocaine 5% applied to wound bed Bathing/ Shower/ Hygiene May shower and wash wound with soap and water. - Avoid getting moisture on wounds. Edema Control - Lymphedema / SCD / Other Bilateral Lower Extremities Elevate legs to the level of the heart or above for 30 minutes daily and/or when sitting for 3-4 times a day throughout the day. Avoid standing for long periods of time. Exercise regularly - As T olerated Moisturize legs daily. Home Health Wound #11 Left,Lateral,Plantar Foot Admit to Home Health for skilled nursing wound care. May utilize formulary equivalent dressing for wound treatment orders unless otherwise specified. Other Home Health  Orders/Instructions: - Only the Left plantar foot dressing to be changed 2 x week by Home Health. The 3rd time in the week, the wound will be dressed at the [facility] Wound Treatment Wound #11 - Foot Wound Laterality: Plantar, Left, Lateral Cleanser: Soap and Water Every Other Day/30 Days Discharge Instructions: May shower and wash wound with dial antibacterial soap and water prior to dressing change. Cleanser: Vashe 5.8 (oz) Every Other Day/30 Days Discharge Instructions: Cleanse the wound with Vashe prior to applying a clean dressing using gauze sponges, not tissue or cotton balls. Cleanser: Wound Cleanser Every Other Day/30 Days Discharge Instructions: Cleanse the wound with wound cleanser prior to applying a clean dressing using gauze sponges, not tissue or cotton balls. Prim Dressing: Maxorb Extra Ag+ Alginate Dressing, 2x2 (in/in) (Generic) Every Other Day/30 Days ary Discharge Instructions: Apply to wound bed as instructed Prim Dressing: Optifoam Non-Adhesive Dressing, 4x4 in (Generic) Every Other Day/30 Days ary Discharge Instructions: Apply to wound bed as instructed Secondary Dressing: Woven Gauze Sponges 2x2 in (Generic) Every Other Day/30 Days Discharge Instructions: Apply over primary dressing as directed. Secured With: Elastic Bandage 4 inch (ACE bandage) (Generic) Every Other Day/30 Days Discharge Instructions: Secure with ACE bandage as directed. Secured With: [employer], 4.5x3.1 (in/yd) (Generic) Every Other Day/30 Days Discharge Instructions: Secure with Kerlix as directed. Secured With: Transpore Surgical Tape, 2x10 (in/yd) (Generic) Every Other Day/30 Days Discharge Instructions: Secure dressing with tape as directed. Wound #13 - Lower Leg Wound Laterality: Right, Anterior, Proximal Cleanser: Soap and Water 1 x Per Week/30 Days Discharge Instructions: May shower and wash wound with dial antibacterial soap and water prior to dressing change. Cleanser:  Vashe 5.8 (oz) 1 x Per Week/30 Days THELMER, KLITZ ([id number]) [phone].pdf Page 7 of 14 Discharge Instructions: Cleanse the wound with Vashe prior to applying a clean dressing using gauze sponges, not tissue or cotton balls. Cleanser: Wound Cleanser 1 x Per Week/30 Days Discharge Instructions: Cleanse the wound with wound cleanser prior to applying a clean dressing using gauze sponges, not tissue or cotton balls. Prim Dressing: Maxorb Extra Ag+ Alginate Dressing, 4x4.75 (in/in) (Generic) 1 x Per Week/30 Days ary Discharge Instructions: Apply to wound bed as instructed Secondary Dressing: Woven Gauze Sponge, Non-Sterile 4x4 in (Generic) 1 x Per Week/30 Days Discharge Instructions: Apply over primary dressing as directed. Compression Wrap: Urgo K2 Lite, (equivalent to a 3 layer) two layer compression system, regular (Generic) 1 x Per Week/30 Days Discharge Instructions: Apply Urgo K2 Lite as directed (alternative to 3 layer compression). Compression Wrap: Tubular netting #5 1 x Per Week/30 Days Wound #14 - Lower Leg Wound Laterality: Right, Anterior Cleanser: Soap and Water 1  x Per Week/30 Days Discharge Instructions: May shower and wash wound with dial antibacterial soap and water prior to dressing change. Cleanser: Vashe 5.8 (oz) 1 x Per Week/30 Days Discharge Instructions: Cleanse the wound with Vashe prior to applying a clean dressing using gauze sponges, not tissue or cotton balls. Cleanser: Wound Cleanser 1 x Per Week/30 Days Discharge Instructions: Cleanse the wound with wound cleanser prior to applying a clean dressing using gauze sponges, not tissue or cotton balls. Prim Dressing: Maxorb Extra Ag+ Alginate Dressing, 4x4.75 (in/in) (Generic) 1 x Per Week/30 Days ary Discharge Instructions: Apply to wound bed as instructed Secondary Dressing: Woven Gauze Sponge, Non-Sterile 4x4 in (Generic) 1 x Per Week/30 Days Discharge Instructions: Apply over primary  dressing as directed. Compression Wrap: Urgo K2 Lite, (equivalent to a 3 layer) two layer compression system, regular (Generic) 1 x Per Week/30 Days Discharge Instructions: Apply Urgo K2 Lite as directed (alternative to 3 layer compression). Compression Wrap: Tubular netting #5 1 x Per Week/30 Days Wound #15 - Lower Leg Wound Laterality: Right, Medial Cleanser: Soap and Water 1 x Per Week/30 Days Discharge Instructions: May shower and wash wound with dial antibacterial soap and water prior to dressing change. Cleanser: Vashe 5.8 (oz) 1 x Per Week/30 Days Discharge Instructions: Cleanse the wound with Vashe prior to applying a clean dressing using gauze sponges, not tissue or cotton balls. Cleanser: Wound Cleanser 1 x Per Week/30 Days Discharge Instructions: Cleanse the wound with wound cleanser prior to applying a clean dressing using gauze sponges, not tissue or cotton balls. Prim Dressing: Maxorb Extra Ag+ Alginate Dressing, 4x4.75 (in/in) (Generic) 1 x Per Week/30 Days ary Discharge Instructions: Apply to wound bed as instructed Secondary Dressing: Woven Gauze Sponge, Non-Sterile 4x4 in (Generic) 1 x Per Week/30 Days Discharge Instructions: Apply over primary dressing as directed. Compression Wrap: Urgo K2 Lite, (equivalent to a 3 layer) two layer compression system, regular (Generic) 1 x Per Week/30 Days Discharge Instructions: Apply Urgo K2 Lite as directed (alternative to 3 layer compression). Compression Wrap: Tubular netting #5 (Generic) 1 x Per Week/30 Days Electronic Signature(s) Signed: 03/05/2023 1:37:38 PM By: Duanne Guess MD FACS Entered By: Duanne Guess on 03/05/2023 05:43:16 -------------------------------------------------------------------------------- Problem List Details Patient Name: Date of Service: Richard [name], [name] 03/05/2023 8:00 A M Medical Record Number: [id number] Patient Account Number: [id number] Richard [name], Richard [name] ([id number])  [phone].pdf Page 8 of 14 Date of Birth/Sex: Treating RN: [birth year] (82 y.o. Yates Decamp Primary Care Provider: Other Clinician: Seabron Spates Referring Provider: Treating Provider/Extender: Verner Mould in Treatment: 1 Active Problems ICD-10 Encounter Code Description Active Date MDM Diagnosis [phone] Non-pressure chronic ulcer of other part of left foot with fat layer exposed 02/25/2023 No Yes L97.812 Non-pressure chronic ulcer of other part of right lower leg with fat layer 02/25/2023 No Yes exposed E11.621 Type 2 diabetes mellitus with foot ulcer 02/25/2023 No Yes E11.622 Type 2 diabetes mellitus with other skin ulcer 02/25/2023 No Yes I50.42 Chronic combined systolic (congestive) and diastolic (congestive) heart failure 02/25/2023 No Yes N18.32 Chronic kidney disease, stage 3b 02/25/2023 No Yes R60.0 Localized edema 02/25/2023 No Yes Inactive Problems Resolved Problems Electronic Signature(s) Signed: 03/05/2023 8:39:40 AM By: Duanne Guess MD FACS Entered By: Duanne Guess on 03/05/2023 05:39:40 -------------------------------------------------------------------------------- Progress Note Details Patient Name: Date of Service: Richard [name], [name] 03/05/2023 8:00 A M Medical Record Number: [id number] Patient Account Number: [id number] Date of Birth/Sex: Treating RN: 1940-09-27 (82 y.o. M) Primary Care Provider: Seabron Spates  Other Clinician: Referring Provider: Treating Provider/Extender: Verner Mould in Treatment: 1 Subjective Chief Complaint Information obtained from Patient 01/12/2021; Wounds to his left upper extremity and bilateral lower extremities. 05/14/2022; bilateral lower extremity wounds, right plantar foot wound 09/05/2022; bilateral lower extremity wounds, left thumb wound 02/25/2023: left foot DFU and right knee and RLE wounds History of Present Illness  (HPI) Admission 7/15 Richard [name], Richard [name] ([id number]) [phone].pdf Page 9 of 14 Richard [name] is an 82 year old male with a past medical history of type 2 diabetes on oral agents, diffuse large B-cell lymphoma, and coronary artery disease status post CABG that presents to the clinic for wounds to his left upper extremity and bilateral lower extremities. He states that he went to urgent care to be evaluated for a fever. And he was instructed to go to the ED to be further evaluated for possible sepsis. Unfortunately he passed out in his car After his appointment and was not discovered for another 3 hours. He was eventually discovered and EMS had to take him out of his vehicle. This is when he developed abrasions to his left arm and legs. He has been using Xeroform to his wound beds every other day. He reports improvement to all the wounds except for the one on the back of his right leg. He denies pain or signs of infection. 7/22; patient presents for 1 week follow-up. He has no issues or complaints today. He has tolerated the compression wrap well on his right lower extremity. He has been able to do the dressing changes without issues to his left leg and left arm. His left arm wound has healed. He denies signs of infection. 7/29; patient presents for 1 week follow-up. He reports that his left leg wound is healed. He tolerated the compression wrap well on his right leg. He has no issues or complaints today. He denies signs of infection. 8/4; patient presents for 1 week follow-up. He continues to see improvement in wound healing to his right lower extremity. He has no issues or complaints today. He denies signs of infection. He does state that he went to the dermatologist and had some lesions removed on his face and left leg. He has been keeping these covered with a Band-Aid. 8/19; patient presents for follow-up. He is tolerated the compression wrap well. He has no issues or  complaints today. He now only has 1 remaining wound. 9/1; patient presents for 1 week follow-up. He has tolerated the compression wrap well and has no issues or complaints today. He has been golfing 3 times weekly. He is in good spirits today. 9/15; patient presents for follow-up. He has no issues or complaints today. The wound is closed. Readmission 05/14/2022 Mr. Abdon Ternes is an 82 year old male with a past medical history of uncontrolled type 2 diabetes on oral agents, diffuse large B-cell lymphoma and coronary artery disease status post CABG that presents the clinic for 3 wounds. He developed a right knee wound when he fell out of his golf cart 3 weeks ago. He developed increased warmth and erythema to the right leg. While he was still waiting in the ED on 04/24/2022 for this issue he dropped his phone and created a wound to his left thigh. He has been using Xeroform to these areas. He ultimately required admission from the ED for right lower extremity cellulitis and was treated with Zyvox and Keflex. He also has a right plantar foot wound that started after having callus debridement by his podiatrist.  He has had this wound for at least 6 weeks. For the foot wound he has not been keeping it covered and walks around barefoot. He has peripheral neuropathy However states that the wound site is painful. He denies any purulent drainage. He has swelling to this area. 11/21; patient presents for follow-up. He had an x-ray done at last clinic visit to the right foot that showed no focal bony erosion suggesting osteomyelitis. He completed his course of levofloxacin. He has been using his front offloading shoe. He has been using Xeroform to the knee wounds. He has no issues or complaints today. 11/28; patient presents for follow-up. His right knee wound has epithelialized. His right plantar foot wound remains closed. He has no issues or complaints today. 09/05/2022 Mr. Hughston Quander is an 82 year old  male with a past medical history of type 2 diabetes on oral agents, diffuse large B-cell lymphoma and coronary artery disease status post CABG that presents the clinic for a 2-week history of wounds to his lower extremities bilaterally and his left thumb. He developed the wounds to his legs after his sister's dog jumped on him. He has been using Vaseline to the areas. He cut his thumb with a knife while cooking. He states he went to the ED and a used Dermabond on this. He currently denies signs of infection. 3/14; patient presents for follow-up. He has been using Xeroform to his lower extremities bilaterally daily. He has been using antibiotic ointment to his left thumb. All wounds have shown improvement in healing. He has no issues or complaints today. 3/28; patient presents for follow-up. We have been using antibiotic ointment to the left thumb and Xeroform to the knees bilaterally. His wounds have healed. READMISSION 02/25/2023 *** ABIs in clinic today-- right: 1.15 left: 1.12.*** He returns to clinic today with a new left foot ulcer. He says he has had a callus there for a while that the podiatrist has been periodically debriding, but it has become more painful. There is discoloration underneath a thick layer of callus suggestive of an open wound. He also struck his right leg on the car door and has multiple skin tears of varying sizes for a total of 4 wounds. Most recent hemoglobin A1c 7.1%. 03/05/2023: The wound on his right knee is healed. The right mid tibial wound is nearly healed under a layer of eschar. The more proximal and distal anterior tibial wounds are smaller. The wound on his left foot appears to have gotten some moisture under the perimeter dry skin and callus, but fortunately this has not resulted in further tissue breakdown. This wound is stable. Patient History Information obtained from Patient. Family History Cancer - Mother, Diabetes - Maternal Grandparents, Heart Disease -  Paternal Grandparents, Hypertension - Paternal Grandparents, Seizures - Child, Stroke - Father, No family history of Hereditary Spherocytosis, Kidney Disease, Lung Disease, Thyroid Problems, Tuberculosis. Social History Never smoker, Marital Status - Divorced, Alcohol Use - Never, Drug Use - No History, Caffeine Use - Rarely. Medical History Eyes Patient has history of Cataracts - Surgery Hematologic/Lymphatic Patient has history of Anemia Respiratory Patient has history of Sleep Apnea Cardiovascular Patient has history of Arrhythmia, Congestive Heart Failure, Coronary Artery Disease, Hypertension Endocrine Patient has history of Type II Diabetes Musculoskeletal Patient has history of Osteoarthritis Neurologic Patient has history of Neuropathy Oncologic Patient has history of Received Chemotherapy Denies history of Received Radiation WADEN, KAMMERDIENER ([id number]) [phone].pdf Page 10 of 14 Hospitalization/Surgery History - cellulitis right leg 10/25-10/28/2023. - balloon  angioplasty 03/2021. - 12/29/2021 CHF. Medical A Surgical History Notes nd Musculoskeletal Spinal Stenosis Oncologic 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 Objective Constitutional no acute distress. Vitals Time Taken: 7:53 AM, Height: 72 in, Weight: 230 lbs, BMI: 31.2, Temperature: 97.6 F, Pulse: 68 bpm, Respiratory Rate: 18 breaths/min, Blood Pressure: 114/70 mmHg, Capillary Blood Glucose: 193 mg/dl. Respiratory Normal work of breathing on room air. General Notes: 03/05/2023: The wound on his right knee is healed. The right mid tibial wound is nearly healed under a layer of eschar. The more proximal and distal anterior tibial wounds are smaller. The wound on his left foot appears to have gotten some moisture under the perimeter dry skin and callus, but fortunately this has not resulted in further tissue breakdown. This wound is stable. Integumentary (Hair, Skin) Wound #11  status is Open. Original cause of wound was Gradually Appeared. The date acquired was: 02/24/2022. The wound has been in treatment 1 weeks. The wound is located on the Left,Lateral,Plantar Foot. The wound measures 0.3cm length x 1.6cm width x 0.1cm depth; 0.377cm^2 area and 0.038cm^3 volume. There is Fat Layer (Subcutaneous Tissue) exposed. There is no tunneling or undermining noted. There is a none present amount of drainage noted. There is small (1-33%) red granulation within the wound bed. There is a large (67-100%) amount of necrotic tissue within the wound bed including Eschar. The periwound skin appearance had no abnormalities noted for moisture. The periwound skin appearance had no abnormalities noted for color. The periwound skin appearance exhibited: Callus. Periwound temperature was noted as No Abnormality. Wound #12 status is Healed - Epithelialized. Original cause of wound was Trauma. The date acquired was: 02/05/2023. The wound has been in treatment 1 weeks. The wound is located on the Right Knee. The wound measures 0cm length x 0cm width x 0cm depth; 0cm^2 area and 0cm^3 volume. There is Fat Layer (Subcutaneous Tissue) exposed. There is no tunneling or undermining noted. There is a medium amount of serosanguineous drainage noted. There is small (1- 33%) red granulation within the wound bed. There is a large (67-100%) amount of necrotic tissue within the wound bed including Eschar. The periwound skin appearance had no abnormalities noted for moisture. The periwound skin appearance had no abnormalities noted for color. The periwound skin appearance exhibited: Scarring. Periwound temperature was noted as No Abnormality. Wound #13 status is Open. Original cause of wound was Skin T ear/Laceration. The date acquired was: 02/05/2023. The wound has been in treatment 1 weeks. The wound is located on the Right,Proximal,Anterior Lower Leg. The wound measures 3.2cm length x 1.6cm width x 0.1cm depth;  4.021cm^2 area and 0.402cm^3 volume. There is Fat Layer (Subcutaneous Tissue) exposed. There is no tunneling or undermining noted. There is a medium amount of serosanguineous drainage noted. There is medium (34-66%) red granulation within the wound bed. There is a medium (34-66%) amount of necrotic tissue within the wound bed including Eschar and Adherent Slough. The periwound skin appearance had no abnormalities noted for moisture. The periwound skin appearance had no abnormalities noted for color. The periwound skin appearance exhibited: Scarring. Periwound temperature was noted as No Abnormality. Wound #14 status is Open. Original cause of wound was Skin T ear/Laceration. The date acquired was: 02/05/2023. The wound has been in treatment 1 weeks. The wound is located on the Right,Anterior Lower Leg. The wound measures 1.2cm length x 0.9cm width x 0.1cm depth; 0.848cm^2 area and 0.085cm^3 volume. There is Fat Layer (Subcutaneous Tissue) exposed. There is no tunneling  or undermining noted. There is a medium amount of serosanguineous drainage noted. There is small (1-33%) red granulation within the wound bed. There is a large (67-100%) amount of necrotic tissue within the wound bed including Eschar. The periwound skin appearance had no abnormalities noted for moisture. The periwound skin appearance had no abnormalities noted for color. The periwound skin appearance exhibited: Scarring. Periwound temperature was noted as No Abnormality. Wound #15 status is Open. Original cause of wound was Skin T ear/Laceration. The date acquired was: 02/05/2023. The wound has been in treatment 1 weeks. The wound is located on the Right,Medial Lower Leg. The wound measures 0.5cm length x 0.4cm width x 0.1cm depth; 0.157cm^2 area and 0.016cm^3 volume. There is Fat Layer (Subcutaneous Tissue) exposed. There is no tunneling or undermining noted. There is a medium amount of serosanguineous drainage noted. There is medium  (34-66%) red granulation within the wound bed. There is a medium (34-66%) amount of necrotic tissue within the wound bed including Eschar. The periwound skin appearance had no abnormalities noted for texture. The periwound skin appearance had no abnormalities noted for moisture. The periwound skin appearance had no abnormalities noted for color. Periwound temperature was noted as No Abnormality. 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 There was a Selective/Open Wound Skin/Epidermis Debridement with a total area of 0.38 sq cm performed by Duanne Guess, MD. With the following instrument(s): Curette to remove Non-Viable tissue/material. Material removed includes Callus, Slough, Skin: Dermis, and Skin: Epidermis after achieving pain control using Lidocaine 4% Topical Solution. No specimens were taken.A Minimum amount of bleeding was controlled with Pressure. The procedure was tolerated well. Post Debridement Measurements: 0.3cm length x 1.6cm width x 0.1cm depth; 0.038cm^3 volume. Character of Wound/Ulcer Post Debridement is improved. Severity of Tissue Post Debridement is: Fat layer exposed. Post procedure Diagnosis Wound #11: Same as Pre-Procedure General Notes: Scribed for Dr Lady Gary by Brenton Grills RN. Wound #13 Pre-procedure diagnosis of  Wound #13 is a Trauma, Other located on the Right,Proximal,Anterior Lower Leg . There was a Selective/Open Wound Non-Viable Tissue Debridement with a total area of 4.02 sq cm performed by Duanne Guess, MD. With the following instrument(s): Curette to remove Non-Viable tissue/material. Material removed includes [facility] after achieving pain control using Lidocaine 4% Topical Solution. No specimens were taken. A time out was conducted at 08:19, prior to the start of the procedure. A Minimum amount of bleeding was controlled with Pressure. The procedure was tolerated well. Post Debridement Measurements: 3.2cm length x 1.6cm width x 0.1cm depth; 0.402cm^3 volume. Character of Wound/Ulcer Post Debridement is improved. Post procedure Diagnosis Wound #13: Same as Pre-Procedure General Notes: Scribed for Dr Lady Gary by Brenton Grills RN. Pre-procedure diagnosis of Wound #13 is a Trauma, Other located on the Right,Proximal,Anterior Lower Leg . There was a Compression Therapy Procedure by Brenton Grills, RN. Post procedure Diagnosis Wound #13: Same as Pre-Procedure Notes: Scribed for Dr Lady Gary by Brenton Grills RN. Wound #14 Pre-procedure diagnosis of  Wound #14 is a Trauma, Other located on the Right,Anterior Lower Leg . There was a Selective/Open Wound Non-Viable Tissue Debridement with a total area of 0.85 sq cm performed by Duanne Guess, MD. With the following instrument(s): Curette to remove Non-Viable tissue/material. Material removed includes Eschar after achieving pain control using Lidocaine 4% Topical Solution. No specimens were taken. A time out was conducted at 08:19, prior to the start of the procedure. A Minimum amount of bleeding was controlled with Pressure. The procedure was tolerated well. Post Debridement Measurements: 1.2cm length x 0.9cm width x 0.1cm depth; 0.085cm^3 volume. Character of Wound/Ulcer Post Debridement is improved. Post procedure Diagnosis Wound #14: Same as  Pre-Procedure General Notes: Scribed for Dr Lady Gary by Brenton Grills RN. Pre-procedure diagnosis of Wound #14 is a Trauma, Other located on the Right,Anterior Lower Leg . There was a Compression Therapy Procedure by Brenton Grills, RN. Post procedure Diagnosis Wound #14: Same as Pre-Procedure Notes: Scribed for Dr Lady Gary by Brenton Grills RN. Wound #15 Pre-procedure diagnosis of Wound #15 is a Trauma, Other located on the Right,Medial Lower Leg . There was a Selective/Open Wound Non-Viable Tissue Debridement with a total area of 0.16 sq cm performed by Duanne Guess, MD. With the following instrument(s): Curette to remove Non-Viable tissue/material. Material removed includes Eschar and Slough and after achieving pain control using Lidocaine 4% Topical Solution. No specimens were taken. A time out was conducted at 08:26, prior to the start of the procedure. A Minimum amount of bleeding was controlled with Pressure. The procedure was tolerated well. Post Debridement Measurements: 0.5cm length x 0.4cm width x 0.1cm depth; 0.016cm^3 volume. Character of Wound/Ulcer Post Debridement is improved. Post procedure Diagnosis Wound #15: Same as Pre-Procedure General Notes: Scribed for Dr Lady Gary by Brenton Grills RN. Pre-procedure diagnosis of Wound #15 is a Trauma, Other located on the Right,Medial Lower Leg . There was a Compression Therapy Procedure by Brenton Grills, RN. Post procedure Diagnosis Wound #15: Same as Pre-Procedure Notes: Scribed for Dr Lady Gary by Brenton Grills RN. Plan Follow-up Appointments: Return Appointment in 1 week. - Dr. Lady Gary Room 3 - please make appt Return Appointment in 2 weeks. - Please ask front desk to schedule an appointment Return appointment in 3 weeks. - Please ask front desk to schedule an appointment Return appointment in 1 month. - Please ask front desk to schedule an appointment Anesthetic: (In clinic) Topical Lidocaine 5% applied to wound bed Bathing/ Shower/  Hygiene: May shower and wash wound with soap and water. - Avoid getting moisture on wounds. Edema Control - Lymphedema / SCD / Other: Elevate legs to the level of the heart or above for 30 minutes daily and/or when sitting for 3-4 times a day throughout the day. Avoid standing for long periods of time. Exercise regularly - As Tolerated Moisturize legs daily. Home Health: Wound #11 Left,Lateral,Plantar Foot: Admit to Home Health for skilled nursing wound care. May utilize formulary equivalent dressing for wound treatment orders unless otherwise specified. Other Home Health Orders/Instructions: - Only the Left plantar foot dressing to be changed 2 x week by Home Health. The 3rd time in the week, the wound will be dressed at the Wound Care Center WOUND #11: - Foot Wound Laterality: Plantar, Left, Lateral Cleanser: Soap and Water Every Other Day/30 Days Discharge Instructions: May shower and wash wound with dial antibacterial soap and water prior to dressing change. Cleanser: Vashe 5.8 (oz) Every Other Day/30 Days Discharge Instructions: Cleanse the wound with Vashe prior to applying a clean  dressing using gauze sponges, not tissue or cotton balls. Richard [name], Richard [name] ([id number]) [id number] Page 12 of 14 Cleanser: Wound Cleanser Every Other Day/30 Days Discharge Instructions: Cleanse the wound with wound cleanser prior to applying a clean dressing using gauze sponges, not tissue or cotton balls. Prim Dressing: Maxorb Extra Ag+ Alginate Dressing, 2x2 (in/in) (Generic) Every Other Day/30 Days ary Discharge Instructions: Apply to wound bed as instructed Prim Dressing: Optifoam Non-Adhesive Dressing, 4x4 in (Generic) Every Other Day/30 Days ary Discharge Instructions: Apply to wound bed as instructed Secondary Dressing: Woven Gauze Sponges 2x2 in (Generic) Every Other Day/30 Days Discharge Instructions: Apply over primary dressing as directed. Secured With: Elastic  Bandage 4 inch (ACE bandage) (Generic) Every Other Day/30 Days Discharge Instructions: Secure with ACE bandage as directed. Secured With: [employer], 4.5x3.1 (in/yd) (Generic) Every Other Day/30 Days Discharge Instructions: Secure with Kerlix as directed. Secured With: [employer] T ape, 2x10 (in/yd) (Generic) Every Other Day/30 Days Discharge Instructions: Secure dressing with tape as directed. WOUND #13: - Lower Leg Wound Laterality: Right, Anterior, Proximal Cleanser: Soap and Water 1 x Per Week/30 Days Discharge Instructions: May shower and wash wound with dial antibacterial soap and water prior to dressing change. Cleanser: Vashe 5.8 (oz) 1 x Per Week/30 Days Discharge Instructions: Cleanse the wound with Vashe prior to applying a clean dressing using gauze sponges, not tissue or cotton balls. Cleanser: Wound Cleanser 1 x Per Week/30 Days Discharge Instructions: Cleanse the wound with wound cleanser prior to applying a clean dressing using gauze sponges, not tissue or cotton balls. Prim Dressing: Maxorb Extra Ag+ Alginate Dressing, 4x4.75 (in/in) (Generic) 1 x Per Week/30 Days ary Discharge Instructions: Apply to wound bed as instructed Secondary Dressing: Woven Gauze Sponge, Non-Sterile 4x4 in (Generic) 1 x Per Week/30 Days Discharge Instructions: Apply over primary dressing as directed. Com pression Wrap: Urgo K2 Lite, (equivalent to a 3 layer) two layer compression system, regular (Generic) 1 x Per Week/30 Days Discharge Instructions: Apply Urgo K2 Lite as directed (alternative to 3 layer compression). Com pression Wrap: Tubular netting #5 1 x Per Week/30 Days WOUND #14: - Lower Leg Wound Laterality: Right, Anterior Cleanser: Soap and Water 1 x Per Week/30 Days Discharge Instructions: May shower and wash wound with dial antibacterial soap and water prior to dressing change. Cleanser: Vashe 5.8 (oz) 1 x Per Week/30 Days Discharge Instructions: Cleanse the wound with  Vashe prior to applying a clean dressing using gauze sponges, not tissue or cotton balls. Cleanser: Wound Cleanser 1 x Per Week/30 Days Discharge Instructions: Cleanse the wound with wound cleanser prior to applying a clean dressing using gauze sponges, not tissue or cotton balls. Prim Dressing: Maxorb Extra Ag+ Alginate Dressing, 4x4.75 (in/in) (Generic) 1 x Per Week/30 Days ary Discharge Instructions: Apply to wound bed as instructed Secondary Dressing: Woven Gauze Sponge, Non-Sterile 4x4 in (Generic) 1 x Per Week/30 Days Discharge Instructions: Apply over primary dressing as directed. Com pression Wrap: Urgo K2 Lite, (equivalent to a 3 layer) two layer compression system, regular (Generic) 1 x Per Week/30 Days Discharge Instructions: Apply Urgo K2 Lite as directed (alternative to 3 layer compression). Com pression Wrap: Tubular netting #5 1 x Per Week/30 Days WOUND #15: - Lower Leg Wound Laterality: Right, Medial Cleanser: Soap and Water 1 x Per Week/30 Days Discharge Instructions: May shower and wash wound with dial antibacterial soap and water prior to dressing change. Cleanser: Vashe 5.8 (oz) 1 x Per Week/30 Days Discharge Instructions: Cleanse the wound with Vashe  prior to applying a clean dressing using gauze sponges, not tissue or cotton balls. Cleanser: Wound Cleanser 1 x Per Week/30 Days Discharge Instructions: Cleanse the wound with wound cleanser prior to applying a clean dressing using gauze sponges, not tissue or cotton balls. Prim Dressing: Maxorb Extra Ag+ Alginate Dressing, 4x4.75 (in/in) (Generic) 1 x Per Week/30 Days ary Discharge Instructions: Apply to wound bed as instructed Secondary Dressing: Woven Gauze Sponge, Non-Sterile 4x4 in (Generic) 1 x Per Week/30 Days Discharge Instructions: Apply over primary dressing as directed. Com pression Wrap: Urgo K2 Lite, (equivalent to a 3 layer) two layer compression system, regular (Generic) 1 x Per Week/30 Days Discharge  Instructions: Apply Urgo K2 Lite as directed (alternative to 3 layer compression). Com pression Wrap: Tubular netting #5 (Generic) 1 x Per Week/30 Days 03/05/2023: The wound on his right knee is healed. The right mid tibial wound is nearly healed under a layer of eschar. The more proximal and distal anterior tibial wounds are smaller. The wound on his left foot appears to have gotten some moisture under the perimeter dry skin and callus, but fortunately this has not resulted in further tissue breakdown. This wound is stable. I used a curette to debride slough, callus, and dry skin from the left foot wound. We will continue silver alginate with a foam donut on this wound. I debrided slough and eschar from the 3 remaining wounds on his right anterior tibial surface. Will continue silver alginate and Urgo light compression wrap. Follow-up in 1 week. Electronic Signature(s) Signed: 03/05/2023 8:44:12 AM By: Duanne Guess MD FACS Entered By: Duanne Guess on 03/05/2023 05:44:12 -------------------------------------------------------------------------------- HxROS Details Patient Name: Date of Service: Richard [name], Richard [name] 03/05/2023 8:00 A M Medical Record Number: [id number] Patient Account Number: [id number] Date of Birth/Sex: Treating RN: 10-31-1940 (82 y.o. RIEL, KORSON ([id number]) [id number] Page 13 of 14 Primary Care Provider: Seabron Spates Other Clinician: Referring Provider: Treating Provider/Extender: Verner Mould in Treatment: 1 Information Obtained From Patient Eyes Medical History: Positive for: Cataracts - Surgery Hematologic/Lymphatic Medical History: Positive for: Anemia Respiratory Medical History: Positive for: Sleep Apnea Cardiovascular Medical History: Positive for: Arrhythmia; Congestive Heart Failure; Coronary Artery Disease; Hypertension Endocrine Medical History: Positive for: Type II  Diabetes Time with diabetes: 21 years Treated with: Oral agents Blood sugar tested every day: Yes Tested : Q am Blood sugar testing results: Breakfast: 202 Musculoskeletal Medical History: Positive for: Osteoarthritis Past Medical History Notes: Spinal Stenosis Neurologic Medical History: Positive for: Neuropathy Oncologic Medical History: Positive for: Received Chemotherapy Negative for: Received Radiation Past Medical History Notes: 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 HBO Extended History Items Eyes: Cataracts Immunizations Pneumococcal Vaccine: Received Pneumococcal Vaccination: Yes Received Pneumococcal Vaccination On or After 60th Birthday: No Implantable Devices Yes Hospitalization / Surgery History Type of Hospitalization/Surgery cellulitis right leg 10/25-10/28/2023 balloon angioplasty 03/2021 12/29/2021 CHF DIMAS, BOMBA ([id number]) [phone].pdf Page 14 of 53 Family and Social History Cancer: Yes - Mother; Diabetes: Yes - Maternal Grandparents; Heart Disease: Yes - Paternal Grandparents; Hereditary Spherocytosis: No; Hypertension: Yes - Paternal Grandparents; Kidney Disease: No; Lung Disease: No; Seizures: Yes - Child; Stroke: Yes - Father; Thyroid Problems: No; Tuberculosis: No; Never smoker; Marital Status - Divorced; Alcohol Use: Never; Drug Use: No History; Caffeine Use: Rarely; Financial Concerns: No; Food, Clothing or Shelter Needs: No; Support System Lacking: No; Transportation Concerns: No Electronic Signature(s) Signed: 03/05/2023 1:37:38 PM By: Duanne Guess MD FACS Entered By: Duanne Guess on 03/05/2023 05:42:33 --------------------------------------------------------------------------------  SuperBill Details Patient Name: Date of Service: Billey Chang 03/05/2023 Medical Record Number: [id number] Patient Account Number: [id number] Date of Birth/Sex: Treating RN: 12-08-1940 (82 y.o. Yates Decamp Primary Care Provider: Seabron Spates Other Clinician: Referring Provider: Treating Provider/Extender: Verner Mould in Treatment: 1 Diagnosis Coding ICD-10 Codes Code Description [phone] Non-pressure chronic ulcer of other part of left foot with fat layer exposed L97.812 Non-pressure chronic ulcer of other part of right lower leg with fat layer exposed E11.621 Type 2 diabetes mellitus with foot ulcer E11.622 Type 2 diabetes mellitus with other skin ulcer I50.42 Chronic combined systolic (congestive) and diastolic (congestive) heart failure N18.32 Chronic kidney disease, stage 3b R60.0 Localized edema Facility Procedures : CPT4 Code: [id number] Description: 88416 - DEBRIDE WOUND 1ST 20 SQ CM OR < ICD-10 Diagnosis Description L97.522 Non-pressure chronic ulcer of other part of left foot with fat layer exposed L97.812 Non-pressure chronic ulcer of other part of right lower leg with fat layer  expos Modifier: ed Quantity: 1 Physician Procedures : CPT4 Code Description Modifier [id number] - WC PHYS LEVEL 3 - EST PT 25 ICD-10 Diagnosis Description L97.522 Non-pressure chronic ulcer of other part of left foot with fat layer exposed L97.812 Non-pressure chronic ulcer of other part of right lower  leg with fat layer exposed E11.621 Type 2 diabetes mellitus with foot ulcer E11.622 Type 2 diabetes mellitus with other skin ulcer Quantity: 1 : [id number] - WC PHYS DEBR WO ANESTH 20 SQ CM ICD-10 Diagnosis Description L97.522 Non-pressure chronic ulcer of other part of left foot with fat layer exposed L97.812 Non-pressure chronic ulcer of other part of right lower leg with fat layer exposed Quantity: 1 Electronic Signature(s) Signed: 03/05/2023 8:45:15 AM By: Duanne Guess MD FACS Entered By: Duanne Guess on 03/05/2023 05:45:15

## 2023-03-12 NOTE — Progress Notes (Signed)
RAMEY, BYUN ([id number]) [id number] Page 1 of 13 Visit Report for 03/11/2023 Chief Complaint Document Details Patient Name: Date of Service: Richard [name] Richard L. 03/11/2023 9:30 A M Medical Record Number: [id number] Patient Account Number: [id number] Date of Birth/Sex: Treating RN: December 19, 1940 (82 y.o. M) Primary Care Provider: Seabron Spates Other Clinician: Referring Provider: Treating Provider/Extender: Verner Mould in Treatment: 2 Information Obtained from: Patient Chief Complaint 01/12/2021; Wounds to his left upper extremity and bilateral lower extremities. 05/14/2022; bilateral lower extremity wounds, right plantar foot wound 09/05/2022; bilateral lower extremity wounds, left thumb wound 02/25/2023: left foot DFU and right knee and RLE wounds Electronic Signature(s) Signed: 03/11/2023 10:26:10 AM By: Duanne Guess MD FACS Entered By: Duanne Guess on 03/11/2023 07:26:10 -------------------------------------------------------------------------------- Debridement Details Patient Name: Date of Service: Richard [name], Richard [name] Richard L. 03/11/2023 9:30 A M Medical Record Number: [id number] Patient Account Number: [id number] Date of Birth/Sex: Treating RN: 1941-03-05 (82 y.o. Richard [name] Primary Care Provider: Seabron Spates Other Clinician: Referring Provider: Treating Provider/Extender: Verner Mould in Treatment: 2 Debridement Performed for Assessment: Wound #13 Right,Proximal,Anterior Lower Leg Performed By: Physician Duanne Guess, MD Debridement Type: Debridement Level of Consciousness (Pre-procedure): Awake and [name] Pre-procedure Verification/Time Out Yes - 10:10 Taken: Start Time: 10:10 Pain Control: Lidocaine 5% topical ointment Percent of Wound Bed Debrided: 100% T Area Debrided (cm): otal 2.12 Tissue and other material debrided: Non-Viable, Eschar, Slough, Slough Level:  Non-Viable Tissue Debridement Description: Selective/Open Wound Instrument: Curette Bleeding: Minimum Hemostasis Achieved: Pressure End Time: 10:12 Procedural Pain: 0 Post Procedural Pain: 0 Response to Treatment: Procedure was tolerated well Level of Consciousness (Post- Awake and [name] procedure): Post Debridement Measurements of Total Wound Length: (cm) 2.7 Width: (cm) 1 Depth: (cm) 0.1 Volume: (cm) 0.212 Richard [name], Richard [name] ([id number]) [id number] Page 2 of 13 Character of Wound/Ulcer Post Debridement: Improved Post Procedure Diagnosis Same as Pre-procedure Notes Scribed for Dr. Lady Gary by J.Scotton Electronic Signature(s) Signed: 03/11/2023 12:01:13 PM By: Duanne Guess MD FACS Signed: 03/11/2023 5:11:22 PM By: Karie Schwalbe RN Entered By: Karie Schwalbe on 03/11/2023 07:19:15 -------------------------------------------------------------------------------- Debridement Details Patient Name: Date of Service: Richard [name], Richard [name] Richard L. 03/11/2023 9:30 A M Medical Record Number: [id number] Patient Account Number: [id number] Date of Birth/Sex: Treating RN: [birth year] (82 y.o. Richard [name] Primary Care Provider: Seabron Spates Other Clinician: Referring Provider: Treating Provider/Extender: Verner Mould in Treatment: 2 Debridement Performed for Assessment: Wound #15 Right,Medial Lower Leg Performed By: Physician Duanne Guess, MD Debridement Type: Debridement Level of Consciousness (Pre-procedure): Awake and [name] Pre-procedure Verification/Time Out Yes - 10:10 Taken: Start Time: 10:10 Pain Control: Lidocaine 5% topical ointment Percent of Wound Bed Debrided: 100% T Area Debrided (cm): otal 0.35 Tissue and other material debrided: Non-Viable, Eschar, Slough, Slough Level: Non-Viable Tissue Debridement Description: Selective/Open Wound Instrument: Curette Bleeding: Minimum Hemostasis Achieved:  Pressure End Time: 10:12 Procedural Pain: 0 Post Procedural Pain: 0 Response to Treatment: Procedure was tolerated well Level of Consciousness (Post- Awake and [name] procedure): Post Debridement Measurements of Total Wound Length: (cm) 0.9 Width: (cm) 0.5 Depth: (cm) 0.1 Volume: (cm) 0.035 Character of Wound/Ulcer Post Debridement: Improved Post Procedure Diagnosis Same as Pre-procedure Notes Scribed for Dr. Lady Gary by J.Scotton Electronic Signature(s) Signed: 03/11/2023 12:01:13 PM By: Duanne Guess MD FACS Signed: 03/11/2023 5:11:22 PM By: Karie Schwalbe RN Entered By: Karie Schwalbe on 03/11/2023 07:20:30 Richard [name], Richard [name] ([id number]) [id number] Page 3 of 13 -------------------------------------------------------------------------------- Debridement Details Patient Name: Date of Service: Richard [name], Richard Richard L.  03/11/2023 9:30 A M Medical Record Number: [id number] Patient Account Number: [id number] Date of Birth/Sex: Treating RN: [birth year] (82 y.o. Richard [name] Primary Care Provider: Seabron Spates Other Clinician: Referring Provider: Treating Provider/Extender: Verner Mould in Treatment: 2 Debridement Performed for Assessment: Wound #16 Left,Anterior Lower Leg Performed By: Physician Duanne Guess, MD Debridement Type: Debridement Level of Consciousness (Pre-procedure): Awake and [name] Pre-procedure Verification/Time Out Yes - 10:10 Taken: Start Time: 10:10 Pain Control: Lidocaine 5% topical ointment Percent of Wound Bed Debrided: 100% T Area Debrided (cm): otal 4.71 Tissue and other material debrided: Viable, Non-Viable, Slough, Skin: Dermis , Skin: Epidermis, Slough, Other: Hematoma Level: Skin/Epidermis Debridement Description: Selective/Open Wound Instrument: Curette, Forceps, Scissors Bleeding: Minimum Hemostasis Achieved: Pressure End Time: 10:12 Procedural Pain: 0 Post Procedural Pain:  0 Response to Treatment: Procedure was tolerated well Level of Consciousness (Post- Awake and [name] procedure): Post Debridement Measurements of Total Wound Length: (cm) 3 Width: (cm) 2 Depth: (cm) 0.3 Volume: (cm) 1.414 Character of Wound/Ulcer Post Debridement: Improved Post Procedure Diagnosis Same as Pre-procedure Notes Scribed for Dr. Lady Gary by J.Scotton Electronic Signature(s) Signed: 03/11/2023 12:01:13 PM By: Duanne Guess MD FACS Signed: 03/11/2023 5:11:22 PM By: Karie Schwalbe RN Entered By: Karie Schwalbe on 03/11/2023 07:21:57 -------------------------------------------------------------------------------- HPI Details Patient Name: Date of Service: Richard [name], Richard [name] Richard L. 03/11/2023 9:30 A M Medical Record Number: [id number] Patient Account Number: [id number] Date of Birth/Sex: Treating RN: [birth year] (82 y.o. M) Primary Care Provider: Seabron Spates Other Clinician: Referring Provider: Treating Provider/Extender: Verner Mould in Treatment: 2 History of Present Illness Richard [name], Richard [name] ([id number]) [id number] Page 4 of 13 HPI Description: Admission 7/15 Mr. Memphis Rudkin is an 82 year old male with a past medical history of type 2 diabetes on oral agents, diffuse large B-cell lymphoma, and coronary artery disease status post CABG that presents to the clinic for wounds to his left upper extremity and bilateral lower extremities. He states that he went to urgent care to be evaluated for a fever. And he was instructed to go to the ED to be further evaluated for possible sepsis. Unfortunately he passed out in his car After his appointment and was not discovered for another 3 hours. He was eventually discovered and EMS had to take him out of his vehicle. This is when he developed abrasions to his left arm and legs. He has been using Xeroform to his wound beds every other day. He reports improvement to all the  wounds except for the one on the back of his right leg. He denies pain or signs of infection. 7/22; patient presents for 1 week follow-up. He has no issues or complaints today. He has tolerated the compression wrap well on his right lower extremity. He has been able to do the dressing changes without issues to his left leg and left arm. His left arm wound has healed. He denies signs of infection. 7/29; patient presents for 1 week follow-up. He reports that his left leg wound is healed. He tolerated the compression wrap well on his right leg. He has no issues or complaints today. He denies signs of infection. 8/4; patient presents for 1 week follow-up. He continues to see improvement in wound healing to his right lower extremity. He has no issues or complaints today. He denies signs of infection. He does state that he went to the dermatologist and had some lesions removed on his face and left leg. He has been keeping these covered with a Band-Aid. 8/19; patient presents  for follow-up. He is tolerated the compression wrap well. He has no issues or complaints today. He now only has 1 remaining wound. 9/1; patient presents for 1 week follow-up. He has tolerated the compression wrap well and has no issues or complaints today. He has been golfing 3 times weekly. He is in good spirits today. 9/15; patient presents for follow-up. He has no issues or complaints today. The wound is closed. Readmission 05/14/2022 Mr. Dmichael Calafiore is an 82 year old male with a past medical history of uncontrolled type 2 diabetes on oral agents, diffuse large B-cell lymphoma and coronary artery disease status post CABG that presents the clinic for 3 wounds. He developed a right knee wound when he fell out of his golf cart 3 weeks ago. He developed increased warmth and erythema to the right leg. While he was still waiting in the ED on 04/24/2022 for this issue he dropped his phone and created a wound to his left thigh. He has  been using Xeroform to these areas. He ultimately required admission from the ED for right lower extremity cellulitis and was treated with Zyvox and Keflex. He also has a right plantar foot wound that started after having callus debridement by his podiatrist. He has had this wound for at least 6 weeks. For the foot wound he has not been keeping it covered and walks around barefoot. He has peripheral neuropathy However states that the wound site is painful. He denies any purulent drainage. He has swelling to this area. 11/21; patient presents for follow-up. He had an x-ray done at last clinic visit to the right foot that showed no focal bony erosion suggesting osteomyelitis. He completed his course of levofloxacin. He has been using his front offloading shoe. He has been using Xeroform to the knee wounds. He has no issues or complaints today. 11/28; patient presents for follow-up. His right knee wound has epithelialized. His right plantar foot wound remains closed. He has no issues or complaints today. 09/05/2022 Mr. Torran Goulder is an 82 year old male with a past medical history of type 2 diabetes on oral agents, diffuse large B-cell lymphoma and coronary artery disease status post CABG that presents the clinic for a 2-week history of wounds to his lower extremities bilaterally and his left thumb. He developed the wounds to his legs after his sister's dog jumped on him. He has been using Vaseline to the areas. He cut his thumb with a knife while cooking. He states he went to the ED and a used Dermabond on this. He currently denies signs of infection. 3/14; patient presents for follow-up. He has been using Xeroform to his lower extremities bilaterally daily. He has been using antibiotic ointment to his left thumb. All wounds have shown improvement in healing. He has no issues or complaints today. 3/28; patient presents for follow-up. We have been using antibiotic ointment to the left thumb and Xeroform  to the knees bilaterally. His wounds have healed. READMISSION 02/25/2023 *** ABIs in clinic today-- right: 1.15 left: 1.12.*** He returns to clinic today with a new left foot ulcer. He says he has had a callus there for a while that the podiatrist has been periodically debriding, but it has become more painful. There is discoloration underneath a thick layer of callus suggestive of an open wound. He also struck his right leg on the car door and has multiple skin tears of varying sizes for a total of 4 wounds. Most recent hemoglobin A1c 7.1%. 03/05/2023: The wound on his right  knee is healed. The right mid tibial wound is nearly healed under a layer of eschar. The more proximal and distal anterior tibial wounds are smaller. The wound on his left foot appears to have gotten some moisture under the perimeter dry skin and callus, but fortunately this has not resulted in further tissue breakdown. This wound is stable. 03/11/2023: The left foot wound has healed. He unfortunately dropped his phone and it struck his left anterior tibia resulting in a hematoma. The distal right anterior tibial surface wound has eschar and silver alginate stuck to the surface, underneath it is smaller. The same applies to the more proximal right anterior tibial wound. Electronic Signature(s) Signed: 03/11/2023 10:27:39 AM By: Duanne Guess MD FACS Entered By: Duanne Guess on 03/11/2023 07:27:38 -------------------------------------------------------------------------------- Physical Exam Details Patient Name: Date of Service: Richard [name] Richard L. 03/11/2023 9:30 A M Medical Record Number: [id number] Patient Account Number: [id number] Date of Birth/Sex: Treating RN: January 15, 1941 (82 y.o. M) Primary Care Provider: Seabron Spates Other Clinician: Referring Provider: Treating Provider/Extender: Richard [name], Richard [name] ([id number]) [id number] Page 5 of 13 Weeks in  Treatment: 2 Constitutional . . . . no acute distress. Respiratory Normal work of breathing on room air. Notes 03/11/2023: The left foot wound has healed. He unfortunately dropped his phone and it struck his left anterior tibia resulting in a hematoma. The distal right anterior tibial surface wound has eschar and silver alginate stuck to the surface, underneath it is smaller. The same applies to the more proximal right anterior tibial wound. Electronic Signature(s) Signed: 03/11/2023 10:39:33 AM By: Duanne Guess MD FACS Previous Signature: 03/11/2023 10:38:30 AM Version By: Duanne Guess MD FACS Entered By: Duanne Guess on 03/11/2023 07:39:33 -------------------------------------------------------------------------------- Physician Orders Details Patient Name: Date of Service: Richard [name], Richard [name] Richard L. 03/11/2023 9:30 A M Medical Record Number: [id number] Patient Account Number: [id number] Date of Birth/Sex: Treating RN: [birth year] (82 y.o. Richard [name] Primary Care Provider: Seabron Spates Other Clinician: Referring Provider: Treating Provider/Extender: Verner Mould in Treatment: 2 Verbal / Phone Orders: No Diagnosis Coding ICD-10 Coding Code Description [phone] Non-pressure chronic ulcer of other part of right lower leg with fat layer exposed L97.822 Non-pressure chronic ulcer of other part of left lower leg with fat layer exposed E11.621 Type 2 diabetes mellitus with foot ulcer E11.622 Type 2 diabetes mellitus with other skin ulcer I50.42 Chronic combined systolic (congestive) and diastolic (congestive) heart failure N18.32 Chronic kidney disease, stage 3b R60.0 Localized edema Follow-up Appointments ppointment in 1 week. - Dr. Lady Gary Room 3 -03/18/23 at 9:30am Return A ppointment in 2 weeks. - Please ask front desk to schedule an appointment Return A Return appointment in 3 weeks. - Please ask front desk to schedule an  appointment Return appointment in 1 month. - Please ask front desk to schedule an appointment Anesthetic (In clinic) Topical Lidocaine 5% applied to wound bed Bathing/ Shower/ Hygiene May shower and wash wound with soap and water. - Avoid getting moisture on wounds-the wraps on the Right leg Edema Control - Lymphedema / SCD / Other Bilateral Lower Extremities Elevate legs to the level of the heart or above for 30 minutes daily and/or when sitting for 3-4 times a day throughout the day. Avoid standing for long periods of time. Exercise regularly - As T olerated Moisturize legs daily. Home Health Discontinue home health for wound care. Rolene Arbour discontinue Home Health services. Wound Treatment Wound #13 - Lower Leg Wound Laterality: Right, Anterior, Proximal Cleanser:  Soap and Water 1 x Per Week/30 Days Discharge Instructions: May shower and wash wound with dial antibacterial soap and water prior to dressing change. AMARIAN, TIBOR ([id number]) [id number] Page 6 of 13 Cleanser: Vashe 5.8 (oz) 1 x Per Week/30 Days Discharge Instructions: Cleanse the wound with Vashe prior to applying a clean dressing using gauze sponges, not tissue or cotton balls. Cleanser: Wound Cleanser 1 x Per Week/30 Days Discharge Instructions: Cleanse the wound with wound cleanser prior to applying a clean dressing using gauze sponges, not tissue or cotton balls. Prim Dressing: Maxorb Extra Ag+ Alginate Dressing, 4x4.75 (in/in) (Generic) 1 x Per Week/30 Days ary Discharge Instructions: Apply to wound bed as instructed Secondary Dressing: Woven Gauze Sponge, Non-Sterile 4x4 in (Generic) 1 x Per Week/30 Days Discharge Instructions: Apply over primary dressing as directed. Compression Wrap: Urgo K2 Lite, (equivalent to a 3 layer) two layer compression system, regular (Generic) 1 x Per Week/30 Days Discharge Instructions: Apply Urgo K2 Lite as directed (alternative to 3 layer  compression). Compression Wrap: Tubular netting #5 1 x Per Week/30 Days Wound #15 - Lower Leg Wound Laterality: Right, Medial Cleanser: Soap and Water 1 x Per Week/30 Days Discharge Instructions: May shower and wash wound with dial antibacterial soap and water prior to dressing change. Cleanser: Vashe 5.8 (oz) 1 x Per Week/30 Days Discharge Instructions: Cleanse the wound with Vashe prior to applying a clean dressing using gauze sponges, not tissue or cotton balls. Cleanser: Wound Cleanser 1 x Per Week/30 Days Discharge Instructions: Cleanse the wound with wound cleanser prior to applying a clean dressing using gauze sponges, not tissue or cotton balls. Prim Dressing: Maxorb Extra Ag+ Alginate Dressing, 4x4.75 (in/in) (Generic) 1 x Per Week/30 Days ary Discharge Instructions: Apply to wound bed as instructed Secondary Dressing: Woven Gauze Sponge, Non-Sterile 4x4 in (Generic) 1 x Per Week/30 Days Discharge Instructions: Apply over primary dressing as directed. Compression Wrap: Urgo K2 Lite, (equivalent to a 3 layer) two layer compression system, regular (Generic) 1 x Per Week/30 Days Discharge Instructions: Apply Urgo K2 Lite as directed (alternative to 3 layer compression). Compression Wrap: Tubular netting #5 (Generic) 1 x Per Week/30 Days Wound #16 - Lower Leg Wound Laterality: Left, Anterior Peri-Wound Care: Sween Lotion (Moisturizing lotion) 3 x Per Week/30 Days Discharge Instructions: Apply moisturizing lotion as directed Prim Dressing: Maxorb Extra Ag+ Alginate Dressing, 2x2 (in/in) 3 x Per Week/30 Days ary Discharge Instructions: Apply to wound bed as instructed Secondary Dressing: ABD Pad, 8x10 3 x Per Week/30 Days Discharge Instructions: Apply over primary dressing as directed. Secondary Dressing: Woven Gauze Sponge, Non-Sterile 4x4 in 3 x Per Week/30 Days Discharge Instructions: Apply over primary dressing as directed. Secured With: Elastic Bandage 4 inch (ACE bandage) 3 x Per  Week/30 Days Discharge Instructions: Secure with ACE bandage as directed. Secured With: [employer], 4.5x3.1 (in/yd) 3 x Per Week/30 Days Discharge Instructions: Secure with Kerlix as directed. Secured With: Paper Tape, 2x10 (in/yd) 3 x Per Week/30 Days Discharge Instructions: Secure dressing with tape as directed. Electronic Signature(s) Signed: 03/11/2023 12:01:13 PM By: Duanne Guess MD FACS Entered By: Duanne Guess on 03/11/2023 07:40:08 Richard [name] ([id number]) [id number] Page 7 of 13 -------------------------------------------------------------------------------- Problem List Details Patient Name: Date of Service: Richard [name] Richard L. 03/11/2023 9:30 A M Medical Record Number: [id number] Patient Account Number: [id number] Date of Birth/Sex: Treating RN: 1940-09-13 (82 y.o. M) Primary Care Provider: Seabron Spates Other Clinician: Referring Provider: Treating Provider/Extender: Verner Mould in Treatment:  2 Active Problems ICD-10 Encounter Code Description Active Date MDM Diagnosis L97.812 Non-pressure chronic ulcer of other part of right lower leg with fat layer 02/25/2023 No Yes exposed L97.822 Non-pressure chronic ulcer of other part of left lower leg with fat layer exposed9/04/2023 No Yes E11.621 Type 2 diabetes mellitus with foot ulcer 02/25/2023 No Yes E11.622 Type 2 diabetes mellitus with other skin ulcer 02/25/2023 No Yes I50.42 Chronic combined systolic (congestive) and diastolic (congestive) heart failure 02/25/2023 No Yes N18.32 Chronic kidney disease, stage 3b 02/25/2023 No Yes R60.0 Localized edema 02/25/2023 No Yes Inactive Problems Resolved Problems ICD-10 Code Description Active Date Resolved Date L97.522 Non-pressure chronic ulcer of other part of left foot with fat layer exposed 02/25/2023 02/25/2023 Electronic Signature(s) Signed: 03/11/2023 10:24:24 AM By: Duanne Guess MD  FACS Entered By: Duanne Guess on 03/11/2023 07:24:24 -------------------------------------------------------------------------------- Progress Note Details Patient Name: Date of Service: Richard [name], Richard [name] Richard L. 03/11/2023 9:30 A M Medical Record Number: [id number] Patient Account Number: [id number] Date of Birth/Sex: Treating RN: 1940/09/27 (82 y.o. M) Primary Care Provider: Seabron Spates Other Clinician: Referring Provider: Treating Provider/Extender: Verner Mould in Treatment: 2 Richard [name], Richard [name] ([id number]) [id number] Page 8 of 13 Subjective Chief Complaint Information obtained from Patient 01/12/2021; Wounds to his left upper extremity and bilateral lower extremities. 05/14/2022; bilateral lower extremity wounds, right plantar foot wound 09/05/2022; bilateral lower extremity wounds, left thumb wound 02/25/2023: left foot DFU and right knee and RLE wounds History of Present Illness (HPI) Admission 7/15 Mr. Tylere Lowers is an 82 year old male with a past medical history of type 2 diabetes on oral agents, diffuse large B-cell lymphoma, and coronary artery disease status post CABG that presents to the clinic for wounds to his left upper extremity and bilateral lower extremities. He states that he went to urgent care to be evaluated for a fever. And he was instructed to go to the ED to be further evaluated for possible sepsis. Unfortunately he passed out in his car After his appointment and was not discovered for another 3 hours. He was eventually discovered and EMS had to take him out of his vehicle. This is when he developed abrasions to his left arm and legs. He has been using Xeroform to his wound beds every other day. He reports improvement to all the wounds except for the one on the back of his right leg. He denies pain or signs of infection. 7/22; patient presents for 1 week follow-up. He has no issues or complaints today. He  has tolerated the compression wrap well on his right lower extremity. He has been able to do the dressing changes without issues to his left leg and left arm. His left arm wound has healed. He denies signs of infection. 7/29; patient presents for 1 week follow-up. He reports that his left leg wound is healed. He tolerated the compression wrap well on his right leg. He has no issues or complaints today. He denies signs of infection. 8/4; patient presents for 1 week follow-up. He continues to see improvement in wound healing to his right lower extremity. He has no issues or complaints today. He denies signs of infection. He does state that he went to the dermatologist and had some lesions removed on his face and left leg. He has been keeping these covered with a Band-Aid. 8/19; patient presents for follow-up. He is tolerated the compression wrap well. He has no issues or complaints today. He now only has 1 remaining wound. 9/1; patient presents for  1 week follow-up. He has tolerated the compression wrap well and has no issues or complaints today. He has been golfing 3 times weekly. He is in good spirits today. 9/15; patient presents for follow-up. He has no issues or complaints today. The wound is closed. Readmission 05/14/2022 Mr. Quintyn Suko is an 82 year old male with a past medical history of uncontrolled type 2 diabetes on oral agents, diffuse large B-cell lymphoma and coronary artery disease status post CABG that presents the clinic for 3 wounds. He developed a right knee wound when he fell out of his golf cart 3 weeks ago. He developed increased warmth and erythema to the right leg. While he was still waiting in the ED on 04/24/2022 for this issue he dropped his phone and created a wound to his left thigh. He has been using Xeroform to these areas. He ultimately required admission from the ED for right lower extremity cellulitis and was treated with Zyvox and Keflex. He also has a right plantar  foot wound that started after having callus debridement by his podiatrist. He has had this wound for at least 6 weeks. For the foot wound he has not been keeping it covered and walks around barefoot. He has peripheral neuropathy However states that the wound site is painful. He denies any purulent drainage. He has swelling to this area. 11/21; patient presents for follow-up. He had an x-ray done at last clinic visit to the right foot that showed no focal bony erosion suggesting osteomyelitis. He completed his course of levofloxacin. He has been using his front offloading shoe. He has been using Xeroform to the knee wounds. He has no issues or complaints today. 11/28; patient presents for follow-up. His right knee wound has epithelialized. His right plantar foot wound remains closed. He has no issues or complaints today. 09/05/2022 Mr. Tzuriel Malinak is an 82 year old male with a past medical history of type 2 diabetes on oral agents, diffuse large B-cell lymphoma and coronary artery disease status post CABG that presents the clinic for a 2-week history of wounds to his lower extremities bilaterally and his left thumb. He developed the wounds to his legs after his sister's dog jumped on him. He has been using Vaseline to the areas. He cut his thumb with a knife while cooking. He states he went to the ED and a used Dermabond on this. He currently denies signs of infection. 3/14; patient presents for follow-up. He has been using Xeroform to his lower extremities bilaterally daily. He has been using antibiotic ointment to his left thumb. All wounds have shown improvement in healing. He has no issues or complaints today. 3/28; patient presents for follow-up. We have been using antibiotic ointment to the left thumb and Xeroform to the knees bilaterally. His wounds have healed. READMISSION 02/25/2023 *** ABIs in clinic today-- right: 1.15 left: 1.12.*** He returns to clinic today with a new left foot ulcer.  He says he has had a callus there for a while that the podiatrist has been periodically debriding, but it has become more painful. There is discoloration underneath a thick layer of callus suggestive of an open wound. He also struck his right leg on the car door and has multiple skin tears of varying sizes for a total of 4 wounds. Most recent hemoglobin A1c 7.1%. 03/05/2023: The wound on his right knee is healed. The right mid tibial wound is nearly healed under a layer of eschar. The more proximal and distal anterior tibial wounds are smaller. The  wound on his left foot appears to have gotten some moisture under the perimeter dry skin and callus, but fortunately this has not resulted in further tissue breakdown. This wound is stable. 03/11/2023: The left foot wound has healed. He unfortunately dropped his phone and it struck his left anterior tibia resulting in a hematoma. The distal right anterior tibial surface wound has eschar and silver alginate stuck to the surface, underneath it is smaller. The same applies to the more proximal right anterior tibial wound. Patient History Information obtained from Patient. Family History Cancer - Mother, Diabetes - Maternal Grandparents, Heart Disease - Paternal Grandparents, Hypertension - Paternal Grandparents, Seizures - Child, Stroke - Father, No family history of Hereditary Spherocytosis, Kidney Disease, Lung Disease, Thyroid Problems, Tuberculosis. Social History Never smoker, Marital Status - Divorced, Alcohol Use - Never, Drug Use - No History, Caffeine Use - Rarely. Medical History Eyes TEGHAN, SCHNEEKLOTH ([id number]) [id number] Page 9 of 13 Patient has history of Cataracts - Surgery Hematologic/Lymphatic Patient has history of Anemia Respiratory Patient has history of Sleep Apnea Cardiovascular Patient has history of Arrhythmia, Congestive Heart Failure, Coronary Artery Disease, Hypertension Endocrine Patient has  history of Type II Diabetes Musculoskeletal Patient has history of Osteoarthritis Neurologic Patient has history of Neuropathy Oncologic Patient has history of Received Chemotherapy Denies history of Received Radiation Hospitalization/Surgery History - cellulitis right leg 10/25-10/28/2023. - balloon angioplasty 03/2021. - 12/29/2021 CHF. Medical A Surgical History Notes nd Musculoskeletal Spinal Stenosis Oncologic 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 Objective Constitutional no acute distress. Vitals Time Taken: 9:18 AM, Height: 72 in, Weight: 230 lbs, BMI: 31.2, Temperature: 97.8 F, Pulse: 77 bpm, Respiratory Rate: 18 breaths/min, Blood Pressure: 116/79 mmHg, Capillary Blood Glucose: 183 mg/dl. Respiratory Normal work of breathing on room air. General Notes: 03/11/2023: The left foot wound has healed. He unfortunately dropped his phone and it struck his left anterior tibia resulting in a hematoma. The distal right anterior tibial surface wound has eschar and silver alginate stuck to the surface, underneath it is smaller. The same applies to the more proximal right anterior tibial wound. Integumentary (Hair, Skin) Wound #11 status is Healed - Epithelialized. Original cause of wound was Gradually Appeared. The date acquired was: 02/24/2022. The wound has been in treatment 2 weeks. The wound is located on the Left,Lateral,Plantar Foot. The wound measures 0cm length x 0cm width x 0cm depth; 0cm^2 area and 0cm^3 volume. There is no tunneling or undermining noted. There is a none present amount of drainage noted. The wound margin is distinct with the outline attached to the wound base. There is no granulation within the wound bed. There is no necrotic tissue within the wound bed. The periwound skin appearance had no abnormalities noted for moisture. The periwound skin appearance had no abnormalities noted for color. The periwound skin appearance did not exhibit: Callus. Periwound  temperature was noted as No Abnormality. Wound #13 status is Open. Original cause of wound was Skin T ear/Laceration. The date acquired was: 02/05/2023. The wound has been in treatment 2 weeks. The wound is located on the Right,Proximal,Anterior Lower Leg. The wound measures 2.7cm length x 1cm width x 0.1cm depth; 2.121cm^2 area and 0.212cm^3 volume. There is Fat Layer (Subcutaneous Tissue) exposed. There is no tunneling or undermining noted. There is a medium amount of serosanguineous drainage noted. There is medium (34-66%) red granulation within the wound bed. There is a medium (34-66%) amount of necrotic tissue within the wound bed including Eschar and Adherent Slough.  The periwound skin appearance had no abnormalities noted for moisture. The periwound skin appearance had no abnormalities noted for color. The periwound skin appearance exhibited: Scarring. Periwound temperature was noted as No Abnormality. Wound #14 status is Healed - Epithelialized. Original cause of wound was Skin T ear/Laceration. The date acquired was: 02/05/2023. The wound has been in treatment 2 weeks. The wound is located on the Right,Anterior Lower Leg. The wound measures 0cm length x 0cm width x 0cm depth; 0cm^2 area and 0cm^3 volume. There is no tunneling or undermining noted. There is a none present amount of drainage noted. The wound margin is fibrotic, thickened scar. There is no granulation within the wound bed. There is no necrotic tissue within the wound bed. The periwound skin appearance had no abnormalities noted for moisture. The periwound skin appearance had no abnormalities noted for color. The periwound skin appearance did not exhibit: Scarring. Periwound temperature was noted as No Abnormality. Wound #15 status is Open. Original cause of wound was Skin T ear/Laceration. The date acquired was: 02/05/2023. The wound has been in treatment 2 weeks. The wound is located on the Right,Medial Lower Leg. The wound measures  0.9cm length x 0.5cm width x 0.1cm depth; 0.353cm^2 area and 0.035cm^3 volume. There is Fat Layer (Subcutaneous Tissue) exposed. There is no tunneling or undermining noted. There is a medium amount of serosanguineous drainage noted. The wound margin is fibrotic, thickened scar. There is medium (34-66%) red granulation within the wound bed. There is a medium (34-66%) amount of necrotic tissue within the wound bed including Eschar. The periwound skin appearance had no abnormalities noted for texture. The periwound skin appearance had no abnormalities noted for moisture. The periwound skin appearance had no abnormalities noted for color. Periwound temperature was noted as No Abnormality. Wound #16 status is Open. Original cause of wound was Hematoma. The date acquired was: 03/08/2023. The wound is located on the Left,Anterior Lower Leg. The wound measures 3cm length x 2cm width x 0.1cm depth; 4.712cm^2 area and 0.471cm^3 volume. There is Fat Layer (Subcutaneous Tissue) exposed. There is no tunneling or undermining noted. There is a medium amount of sanguinous drainage noted. The wound margin is distinct with the outline attached to the wound base. There is large (67-100%) red, hyper - granulation within the wound bed. There is a small (1-33%) amount of necrotic tissue within the wound bed including Eschar. The periwound skin appearance had no abnormalities noted for moisture. The periwound skin appearance exhibited: Scarring, Hemosiderin Staining. Periwound temperature was noted as No Abnormality. Richard [name], Richard [name] ([id number]) [id number] Page 10 of 13 Assessment Active Problems ICD-10 Non-pressure chronic ulcer of other part of right lower leg with fat layer exposed Non-pressure chronic ulcer of other part of left lower leg with fat layer exposed Type 2 diabetes mellitus with foot ulcer Type 2 diabetes mellitus with other skin ulcer Chronic combined systolic (congestive) and  diastolic (congestive) heart failure Chronic kidney disease, stage 3b Localized edema Procedures Wound #13 Pre-procedure diagnosis of Wound #13 is a Trauma, Other located on the Right,Proximal,Anterior Lower Leg . There was a Selective/Open Wound Non-Viable Tissue Debridement with a total area of 2.12 sq cm performed by Duanne Guess, MD. With the following instrument(s): Curette to remove Non-Viable tissue/material. Material removed includes Eschar and Slough and after achieving pain control using Lidocaine 5% topical ointment. No specimens were taken. A time out was conducted at 10:10, prior to the start of the procedure. A Minimum amount of bleeding was controlled with Pressure. The  procedure was tolerated well with a pain level of 0 throughout and a pain level of 0 following the procedure. Post Debridement Measurements: 2.7cm length x 1cm width x 0.1cm depth; 0.212cm^3 volume. Character of Wound/Ulcer Post Debridement is improved. Post procedure Diagnosis Wound #13: Same as Pre-Procedure General Notes: Scribed for Dr. Lady Gary by J.Scotton. Pre-procedure diagnosis of Wound #13 is a Trauma, Other located on the Right,Proximal,Anterior Lower Leg . There was a Three Layer Compression Therapy Procedure by Karie Schwalbe, RN. Post procedure Diagnosis Wound #13: Same as Pre-Procedure Wound #15 Pre-procedure diagnosis of Wound #15 is a Trauma, Other located on the Right,Medial Lower Leg . There was a Selective/Open Wound Non-Viable Tissue Debridement with a total area of 0.35 sq cm performed by Duanne Guess, MD. With the following instrument(s): Curette to remove Non-Viable tissue/material. Material removed includes Eschar and Slough and after achieving pain control using Lidocaine 5% topical ointment. No specimens were taken. A time out was conducted at 10:10, prior to the start of the procedure. A Minimum amount of bleeding was controlled with Pressure. The procedure was tolerated well  with a pain level of 0 throughout and a pain level of 0 following the procedure. Post Debridement Measurements: 0.9cm length x 0.5cm width x 0.1cm depth; 0.035cm^3 volume. Character of Wound/Ulcer Post Debridement is improved. Post procedure Diagnosis Wound #15: Same as Pre-Procedure General Notes: Scribed for Dr. Lady Gary by J.Scotton. Pre-procedure diagnosis of Wound #15 is a Trauma, Other located on the Right,Medial Lower Leg . There was a Three Layer Compression Therapy Procedure by Karie Schwalbe, RN. Post procedure Diagnosis Wound #15: Same as Pre-Procedure Wound #16 Pre-procedure diagnosis of Wound #16 is a Trauma, Other located on the Left,Anterior Lower Leg . There was a Selective/Open Wound Skin/Epidermis Debridement with a total area of 4.71 sq cm performed by Duanne Guess, MD. With the following instrument(s): Curette, Forceps, and Scissors to remove Viable and Non-Viable tissue/material. Material removed includes Slough, Skin: Dermis, Skin: Epidermis, and Other: Hematoma after achieving pain control using Lidocaine 5% topical ointment. No specimens were taken. A time out was conducted at 10:10, prior to the start of the procedure. A Minimum amount of bleeding was controlled with Pressure. The procedure was tolerated well with a pain level of 0 throughout and a pain level of 0 following the procedure. Post Debridement Measurements: 3cm length x 2cm width x 0.3cm depth; 1.414cm^3 volume. Character of Wound/Ulcer Post Debridement is improved. Post procedure Diagnosis Wound #16: Same as Pre-Procedure General Notes: Scribed for Dr. Lady Gary by J.Scotton. Plan Follow-up Appointments: Return Appointment in 1 week. - Dr. Lady Gary Room 3 -03/18/23 at 9:30am Return Appointment in 2 weeks. - Please ask front desk to schedule an appointment Return appointment in 3 weeks. - Please ask front desk to schedule an appointment Return appointment in 1 month. - Please ask front desk to schedule an  appointment Anesthetic: (In clinic) Topical Lidocaine 5% applied to wound bed Bathing/ Shower/ Hygiene: May shower and wash wound with soap and water. - Avoid getting moisture on wounds-the wraps on the Right leg Edema Control - Lymphedema / SCD / Other: Elevate legs to the level of the heart or above for 30 minutes daily and/or when sitting for 3-4 times a day throughout the day. Avoid standing for long periods of time. Exercise regularly - As Tolerated Moisturize legs daily. Home Health: Discontinue home health for wound care. Rolene Arbour discontinue Home Health services. WOUND #13: - Lower Leg Wound Laterality: Right, Anterior, Proximal Cleanser: Soap and  Water 1 x Per Week/30 Days Discharge Instructions: May shower and wash wound with dial antibacterial soap and water prior to dressing change. Cleanser: Vashe 5.8 (oz) 1 x Per Week/30 Days Richard [name], Richard [name] ([id number]) [phone].pdf Page 11 of 13 Discharge Instructions: Cleanse the wound with Vashe prior to applying a clean dressing using gauze sponges, not tissue or cotton balls. Cleanser: Wound Cleanser 1 x Per Week/30 Days Discharge Instructions: Cleanse the wound with wound cleanser prior to applying a clean dressing using gauze sponges, not tissue or cotton balls. Prim Dressing: Maxorb Extra Ag+ Alginate Dressing, 4x4.75 (in/in) (Generic) 1 x Per Week/30 Days ary Discharge Instructions: Apply to wound bed as instructed Secondary Dressing: Woven Gauze Sponge, Non-Sterile 4x4 in (Generic) 1 x Per Week/30 Days Discharge Instructions: Apply over primary dressing as directed. Com pression Wrap: Urgo K2 Lite, (equivalent to a 3 layer) two layer compression system, regular (Generic) 1 x Per Week/30 Days Discharge Instructions: Apply Urgo K2 Lite as directed (alternative to 3 layer compression). Com pression Wrap: Tubular netting #5 1 x Per Week/30 Days WOUND #15: - Lower Leg Wound Laterality: Right,  Medial Cleanser: Soap and Water 1 x Per Week/30 Days Discharge Instructions: May shower and wash wound with dial antibacterial soap and water prior to dressing change. Cleanser: Vashe 5.8 (oz) 1 x Per Week/30 Days Discharge Instructions: Cleanse the wound with Vashe prior to applying a clean dressing using gauze sponges, not tissue or cotton balls. Cleanser: Wound Cleanser 1 x Per Week/30 Days Discharge Instructions: Cleanse the wound with wound cleanser prior to applying a clean dressing using gauze sponges, not tissue or cotton balls. Prim Dressing: Maxorb Extra Ag+ Alginate Dressing, 4x4.75 (in/in) (Generic) 1 x Per Week/30 Days ary Discharge Instructions: Apply to wound bed as instructed Secondary Dressing: Woven Gauze Sponge, Non-Sterile 4x4 in (Generic) 1 x Per Week/30 Days Discharge Instructions: Apply over primary dressing as directed. Com pression Wrap: Urgo K2 Lite, (equivalent to a 3 layer) two layer compression system, regular (Generic) 1 x Per Week/30 Days Discharge Instructions: Apply Urgo K2 Lite as directed (alternative to 3 layer compression). Com pression Wrap: Tubular netting #5 (Generic) 1 x Per Week/30 Days WOUND #16: - Lower Leg Wound Laterality: Left, Anterior Peri-Wound Care: Sween Lotion (Moisturizing lotion) 3 x Per Week/30 Days Discharge Instructions: Apply moisturizing lotion as directed Prim Dressing: Maxorb Extra Ag+ Alginate Dressing, 2x2 (in/in) 3 x Per Week/30 Days ary Discharge Instructions: Apply to wound bed as instructed Secondary Dressing: ABD Pad, 8x10 3 x Per Week/30 Days Discharge Instructions: Apply over primary dressing as directed. Secondary Dressing: Woven Gauze Sponge, Non-Sterile 4x4 in 3 x Per Week/30 Days Discharge Instructions: Apply over primary dressing as directed. Secured With: Elastic Bandage 4 inch (ACE bandage) 3 x Per Week/30 Days Discharge Instructions: Secure with ACE bandage as directed. Secured With: [employer], 4.5x3.1  (in/yd) 3 x Per Week/30 Days Discharge Instructions: Secure with Kerlix as directed. Secured With: Paper T ape, 2x10 (in/yd) 3 x Per Week/30 Days Discharge Instructions: Secure dressing with tape as directed. 03/11/2023: The left foot wound has healed. He unfortunately dropped his phone and it struck his left anterior tibia resulting in a hematoma. The distal right anterior tibial surface wound has eschar and silver alginate stuck to the surface, underneath it is smaller. The same applies to the more proximal right anterior tibial wound. I used a curette to debride the eschar, dried silver alginate, and slough from the wounds on his right leg. I used scissors  and forceps to unroof the hematoma and then debrided slough, skin, and old clot from the new wound on his left anterior lower leg. We will apply silver alginate to all of the open wounds and bilateral Urgo lite wraps. Follow-up in 1 week. Electronic Signature(s) Signed: 03/11/2023 10:42:28 AM By: Duanne Guess MD FACS Entered By: Duanne Guess on 03/11/2023 07:42:28 -------------------------------------------------------------------------------- HxROS Details Patient Name: Date of Service: Richard [name], Richard [name] Richard L. 03/11/2023 9:30 A M Medical Record Number: [id number] Patient Account Number: [id number] Date of Birth/Sex: Treating RN: [birth year] (82 y.o. M) Primary Care Provider: Seabron Spates Other Clinician: Referring Provider: Treating Provider/Extender: Verner Mould in Treatment: 2 Information Obtained From Patient Eyes Medical History: Positive for: Cataracts - Surgery Hematologic/Lymphatic Richard [name], Richard [name] ([id number]) [id number] Page 12 of 13 Medical History: Positive for: Anemia Respiratory Medical History: Positive for: Sleep Apnea Cardiovascular Medical History: Positive for: Arrhythmia; Congestive Heart Failure; Coronary Artery Disease;  Hypertension Endocrine Medical History: Positive for: Type II Diabetes Time with diabetes: 21 years Treated with: Oral agents Blood sugar tested every day: Yes Tested : Q am Blood sugar testing results: Breakfast: 202 Musculoskeletal Medical History: Positive for: Osteoarthritis Past Medical History Notes: Spinal Stenosis Neurologic Medical History: Positive for: Neuropathy Oncologic Medical History: Positive for: Received Chemotherapy Negative for: Received Radiation Past Medical History Notes: 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 HBO Extended History Items Eyes: Cataracts Immunizations Pneumococcal Vaccine: Received Pneumococcal Vaccination: Yes Received Pneumococcal Vaccination On or After 60th Birthday: No Implantable Devices Yes Hospitalization / Surgery History Type of Hospitalization/Surgery cellulitis right leg 10/25-10/28/2023 balloon angioplasty 03/2021 12/29/2021 CHF Family and Social History Cancer: Yes - Mother; Diabetes: Yes - Maternal Grandparents; Heart Disease: Yes - Paternal Grandparents; Hereditary Spherocytosis: No; Hypertension: Yes - Paternal Grandparents; Kidney Disease: No; Lung Disease: No; Seizures: Yes - Child; Stroke: Yes - Father; Thyroid Problems: No; Tuberculosis: No; Never smoker; Marital Status - Divorced; Alcohol Use: Never; Drug Use: No History; Caffeine Use: Rarely; Financial Concerns: No; Food, Clothing or Shelter Needs: No; Support System Lacking: No; Transportation Concerns: No Electronic Signature(s) Signed: 03/11/2023 12:01:13 PM By: Duanne Guess MD FACS Entered By: Duanne Guess on 03/11/2023 07:36:33 Richard [name] ([id number]) [id number] Page 13 of 13 -------------------------------------------------------------------------------- SuperBill Details Patient Name: Date of Service: Richard [name] Richard L. 03/11/2023 Medical Record Number: [id number] Patient Account Number:  [id number] Date of Birth/Sex: Treating RN: [birth year] (82 y.o. M) Primary Care Provider: Seabron Spates Other Clinician: Referring Provider: Treating Provider/Extender: Verner Mould in Treatment: 2 Diagnosis Coding ICD-10 Codes Code Description [phone] Non-pressure chronic ulcer of other part of right lower leg with fat layer exposed L97.822 Non-pressure chronic ulcer of other part of left lower leg with fat layer exposed E11.621 Type 2 diabetes mellitus with foot ulcer E11.622 Type 2 diabetes mellitus with other skin ulcer I50.42 Chronic combined systolic (congestive) and diastolic (congestive) heart failure N18.32 Chronic kidney disease, stage 3b R60.0 Localized edema Facility Procedures : CPT4 Code: [id number] Description: 97597 - DEBRIDE WOUND 1ST 20 SQ CM OR < ICD-10 Diagnosis Description L97.812 Non-pressure chronic ulcer of other part of right lower leg with fat layer expos L97.822 Non-pressure chronic ulcer of other part of left lower leg with fat layer  expose Modifier: ed d Quantity: 1 Physician Procedures : CPT4 Code Description Modifier [id number] 99214 - WC PHYS LEVEL 4 - EST PT 25 ICD-10 Diagnosis Description L97.812 Non-pressure chronic ulcer of other part of right lower leg with fat layer exposed Y30.160  Non-pressure chronic ulcer of other part of left  lower leg with fat layer exposed E11.622 Type 2 diabetes mellitus with other skin ulcer I50.42 Chronic combined systolic (congestive) and diastolic (congestive) heart failure Quantity: 1 : [id number] 97597 - WC PHYS DEBR WO ANESTH 20 SQ CM ICD-10 Diagnosis Description L97.812 Non-pressure chronic ulcer of other part of right lower leg with fat layer exposed L97.822 Non-pressure chronic ulcer of other part of left lower leg with fat layer  exposed Quantity: 1 Electronic Signature(s) Signed: 03/11/2023 10:43:18 AM By: Duanne Guess MD FACS Entered By: Duanne Guess on 03/11/2023 07:43:18

## 2023-03-12 NOTE — Progress Notes (Signed)
KAMANI, RUTENBERG ([id number]) [id number] Page 1 of 13 Visit Report for 03/11/2023 Arrival Information Details Patient Name: Date of Service: Richard [name] Alert [name] 03/11/2023 9:30 A M Medical Record Number: [id number] Patient Account Number: [id number] Date of Birth/Sex: Treating RN: 1941/04/16 (82 y.o. M) Primary Care Chandan Fly: Seabron Spates Other Clinician: Referring Quenisha Lovins: Treating Damauri Minion/Extender: Verner Mould in Treatment: 2 Visit Information History Since Last Visit All ordered tests and consults were completed: No Patient Arrived: Ambulatory Added or deleted any medications: No Arrival Time: 09:18 Any new allergies or adverse reactions: No Accompanied By: self Had a fall or experienced change in No Transfer Assistance: None activities of daily living that may affect Patient Identification Verified: Yes risk of falls: Secondary Verification Process Completed: Yes Signs or symptoms of abuse/neglect since last visito No Patient Has Alerts: Yes Hospitalized since last visit: No Patient Alerts: Patient on Blood Thinner Implantable device outside of the clinic excluding No Eliquis cellular tissue based products placed in the center since last visit: Pain Present Now: No Electronic Signature(s) Signed: 03/11/2023 10:07:39 AM By: Dayton Scrape Entered By: Dayton Scrape on 03/11/2023 06:18:50 -------------------------------------------------------------------------------- Compression Therapy Details Patient Name: Date of Service: Richard [name] Alert [name] 03/11/2023 9:30 A M Medical Record Number: [id number] Patient Account Number: [id number] Date of Birth/Sex: Treating RN: 18-Oct-1940 (82 y.o. Richard [name] Primary Care Sherrilynn Gudgel: Seabron Spates Other Clinician: Referring Annalese Stiner: Treating Janaya Broy/Extender: Verner Mould in Treatment: 2 Compression Therapy Performed for Wound Assessment:  Wound #13 Right,Proximal,Anterior Lower Leg Performed By: Clinician Karie Schwalbe, RN Compression Type: Three Layer Post Procedure Diagnosis Same as Pre-procedure Electronic Signature(s) Signed: 03/11/2023 5:11:22 PM By: Karie Schwalbe RN Entered By: Karie Schwalbe on 03/11/2023 07:23:47 [name] ([id number]) [id number] Page 2 of 13 -------------------------------------------------------------------------------- Compression Therapy Details Patient Name: Date of Service: Richard [name] Alert [name] 03/11/2023 9:30 A M Medical Record Number: [id number] Patient Account Number: [id number] Date of Birth/Sex: Treating RN: 16-Jan-1941 (82 y.o. Richard [name] Primary Care Nikayla Madaris: Seabron Spates Other Clinician: Referring Havilah Topor: Treating Elsbeth Yearick/Extender: Verner Mould in Treatment: 2 Compression Therapy Performed for Wound Assessment: Wound #15 Right,Medial Lower Leg Performed By: Clinician Karie Schwalbe, RN Compression Type: Three Layer Post Procedure Diagnosis Same as Pre-procedure Electronic Signature(s) Signed: 03/11/2023 5:11:22 PM By: Karie Schwalbe RN Entered By: Karie Schwalbe on 03/11/2023 07:23:47 -------------------------------------------------------------------------------- Encounter Discharge Information Details Patient Name: Date of Service: Richard [name], Richard [name] Presto [name] 03/11/2023 9:30 A M Medical Record Number: [id number] Patient Account Number: [id number] Date of Birth/Sex: Treating RN: [birth year] (82 y.o. Richard [name] Primary Care Lamari Youngers: Seabron Spates Other Clinician: Referring Damarion Mendizabal: Treating Haiden Clucas/Extender: Verner Mould in Treatment: 2 Encounter Discharge Information Items Post Procedure Vitals Discharge Condition: Stable Temperature (F): 97.8 Ambulatory Status: Ambulatory Pulse (bpm): 77 Discharge Destination: Home Respiratory Rate (breaths/min):  18 Transportation: Private Auto Blood Pressure (mmHg): 116/79 Accompanied By: self Schedule Follow-up Appointment: Yes Clinical Summary of Care: Patient Declined Electronic Signature(s) Signed: 03/11/2023 5:11:22 PM By: Karie Schwalbe RN Entered By: Karie Schwalbe on 03/11/2023 13:59:47 -------------------------------------------------------------------------------- Lower Extremity Assessment Details Patient Name: Date of Service: Richard [name] Alert [name] 03/11/2023 9:30 A M Medical Record Number: [id number] Patient Account Number: [id number] Date of Birth/Sex: Treating RN: [birth year] (82 y.o. Richard [name] Primary Care Nathanael Krist: Seabron Spates Other Clinician: Referring Kimeka Badour: Treating Norah Fick/Extender: Verner Mould in Treatment: 2 Edema Assessment Assessed: [Left: No] [Right: No] [Left: Edema] [Right: :] [address] GRAYLAND, BANUELOS ([id number])  [id number] Page 3 of 13 Left: Right: Point of Measurement: 35 cm From Medial Instep 43 cm 43 cm Ankle Left: Right: Point of Measurement: 11 cm From Medial Instep 25 cm 25 cm Knee To Floor Left: Right: From Medial Instep 47 cm 47 cm Vascular Assessment Pulses: Dorsalis Pedis Palpable: [Left:Yes] [Right:Yes] Extremity colors, hair growth, and conditions: Extremity Color: [Left:Hyperpigmented] [Right:Hyperpigmented] Hair Growth on Extremity: [Left:No] [Right:No] Temperature of Extremity: [Left:Warm] [Right:Warm] Capillary Refill: [Left:< 3 seconds] [Right:< 3 seconds] Dependent Rubor: [Left:No No] [Right:No No] Electronic Signature(s) Signed: 03/11/2023 5:11:22 PM By: Karie Schwalbe RN Entered By: Karie Schwalbe on 03/11/2023 06:51:42 -------------------------------------------------------------------------------- Multi Wound Chart Details Patient Name: Date of Service: Richard [name], Richard [name] Presto [name] 03/11/2023 9:30 A M Medical Record Number: [id number] Patient Account Number:  [id number] Date of Birth/Sex: Treating RN: 03/03/1941 (82 y.o. M) Primary Care Eunice Winecoff: Seabron Spates Other Clinician: Referring Selita Staiger: Treating Ashli Selders/Extender: Verner Mould in Treatment: 2 Vital Signs Height(in): 72 Capillary Blood Glucose(mg/dl): 595 Weight(lbs): 638 Pulse(bpm): 77 Body Mass Index(BMI): 31.2 Blood Pressure(mmHg): 116/79 Temperature(F): 97.8 Respiratory Rate(breaths/min): 18 [11:Photos:] Left, Lateral, Plantar Foot Right, Proximal, Anterior Lower Leg Right, Anterior Lower Leg Wound Location: Gradually Appeared Skin Tear/Laceration Skin Tear/Laceration Wounding Event: Diabetic Wound/Ulcer of the Lower Trauma, Other Trauma, Other Primary Etiology: Extremity Cataracts, Anemia, Sleep Apnea, Cataracts, Anemia, Sleep Apnea, Cataracts, Anemia, Sleep Apnea, Comorbid History: Arrhythmia, Congestive Heart Failure, Arrhythmia, Congestive Heart Failure, Arrhythmia, Congestive Heart Failure, Coronary Artery Disease, Coronary Artery Disease, Coronary Artery Disease, Hypertension, Type II Diabetes, Hypertension, Type II Diabetes, Hypertension, Type II Diabetes, Osteoarthritis, Neuropathy, Received Osteoarthritis, Neuropathy, Received Osteoarthritis, Neuropathy, Received Chemotherapy Chemotherapy Chemotherapy 02/24/2022 02/05/2023 02/05/2023 Date Acquired: SERENITY, RAPA ([id number]) [phone].pdf Page 4 of 13 2 2 2  Weeks of Treatment: Healed - Epithelialized Open Healed - Epithelialized Wound Status: No No No Wound Recurrence: 0x0x0 2.7x1x0.1 0x0x0 Measurements L x W x D (cm) 0 2.121 0 A (cm) : rea 0 0.212 0 Volume (cm) : 100.00% 59.10% 100.00% % Reduction in Area: 100.00% 59.10% 100.00% % Reduction in Volume: Grade 1 Full Thickness Without Exposed Full Thickness Without Exposed Classification: Support Structures Support Structures None Present Medium None Present Exudate A mount: N/A  Serosanguineous N/A Exudate Type: N/A red, brown N/A Exudate Color: Distinct, outline attached N/A Fibrotic scar, thickened scar Wound Margin: None Present (0%) Medium (34-66%) None Present (0%) Granulation A mount: N/A Red N/A Granulation Quality: None Present (0%) Medium (34-66%) None Present (0%) Necrotic A mount: N/A Eschar, Adherent Slough N/A Necrotic Tissue: Large (67-100%) Small (1-33%) Large (67-100%) Epithelialization: N/A Debridement - Selective/Open Wound N/A Debridement: N/A 10:10 N/A Pre-procedure Verification/Time Out Taken: N/A Lidocaine 5% topical ointment N/A Pain Control: N/A Necrotic/Eschar, Slough N/A Tissue Debrided: N/A Non-Viable Tissue N/A Level: N/A 2.12 N/A Debridement A (sq cm): rea N/A Curette N/A Instrument: N/A Minimum N/A Bleeding: N/A Pressure N/A Hemostasis A chieved: N/A 0 N/A Procedural Pain: N/A 0 N/A Post Procedural Pain: N/A Procedure was tolerated well N/A Debridement Treatment Response: N/A 2.7x1x0.1 N/A Post Debridement Measurements L x W x D (cm) N/A 0.212 N/A Post Debridement Volume: (cm) Callus: No Scarring: Yes Scarring: No Periwound Skin Texture: No Abnormalities Noted No Abnormalities Noted No Abnormalities Noted Periwound Skin Moisture: No Abnormalities Noted No Abnormalities Noted No Abnormalities Noted Periwound Skin Color: No Abnormality No Abnormality No Abnormality Temperature: N/A Compression Therapy N/A Procedures Performed: Debridement Wound Number: 15 16 N/A Photos: N/A Right, Medial Lower Leg Left, Anterior Lower Leg N/A Wound Location: Skin T ear/Laceration  Hematoma N/A Wounding Event: Trauma, Other Trauma, Other N/A Primary Etiology: Cataracts, Anemia, Sleep Apnea, Cataracts, Anemia, Sleep Apnea, N/A Comorbid History: Arrhythmia, Congestive Heart Failure, Arrhythmia, Congestive Heart Failure, Coronary Artery Disease, Coronary Artery Disease, Hypertension, Type II Diabetes,  Hypertension, Type II Diabetes, Osteoarthritis, Neuropathy, Received Osteoarthritis, Neuropathy, Received Chemotherapy Chemotherapy 02/05/2023 03/08/2023 N/A Date Acquired: 2 0 N/A Weeks of Treatment: Open Open N/A Wound Status: No No N/A Wound Recurrence: 0.9x0.5x0.1 3x2x0.1 N/A Measurements L x W x D (cm) 0.353 4.712 N/A A (cm) : rea 0.035 0.471 N/A Volume (cm) : -7.00% N/A N/A % Reduction in A rea: -6.10% N/A N/A % Reduction in Volume: Full Thickness Without Exposed Full Thickness Without Exposed N/A Classification: Support Structures Support Structures Medium Medium N/A Exudate A mount: Serosanguineous Sanguinous N/A Exudate Type: red, brown red N/A Exudate Color: Fibrotic scar, thickened scar Distinct, outline attached N/A Wound Margin: Medium (34-66%) Large (67-100%) N/A Granulation A mount: Red Red, Hyper-granulation N/A Granulation Quality: Medium (34-66%) Small (1-33%) N/A Necrotic A mount: Eschar Eschar N/A Necrotic Tissue: Fat Layer (Subcutaneous Tissue): Yes Fat Layer (Subcutaneous Tissue): Yes N/A Exposed Structures: Small (1-33%) N/A N/A Epithelialization: Debridement - Selective/Open Wound Debridement - Selective/Open Wound N/A Debridement: Pre-procedure Verification/Time Out 10:10 10:10 N/A KAIKANE, BENGEL ([id number]) [id number] Page 5 of 13 Taken: Lidocaine 5% topical ointment Lidocaine 5% topical ointment N/A Pain Control: Necrotic/Eschar, [employer]	[place], Slough N/A Tissue Debrided: Non-Viable Tissue Skin/Epidermis N/A Level: 0.35 4.71 N/A Debridement A (sq cm): rea Curette Curette, Forceps, Scissors N/A Instrument: Minimum Minimum N/A Bleeding: Pressure Pressure N/A Hemostasis A chieved: 0 0 N/A Procedural Pain: 0 0 N/A Post Procedural Pain: Procedure was tolerated well Procedure was tolerated well N/A Debridement Treatment Response: 0.9x0.5x0.1 3x2x0.3 N/A Post Debridement Measurements L x W x D  (cm) 0.035 1.414 N/A Post Debridement Volume: (cm) No Abnormalities Noted Scarring: Yes N/A Periwound Skin Texture: No Abnormalities Noted No Abnormalities Noted N/A Periwound Skin Moisture: No Abnormalities Noted Hemosiderin Staining: Yes N/A Periwound Skin Color: No Abnormality No Abnormality N/A Temperature: Compression Therapy Debridement N/A Procedures Performed: Debridement Treatment Notes Electronic Signature(s) Signed: 03/11/2023 10:25:21 AM By: Duanne Guess MD FACS Entered By: Duanne Guess on 03/11/2023 07:25:21 -------------------------------------------------------------------------------- Multi-Disciplinary Care Plan Details Patient Name: Date of Service: Richard [name], Richard [name] Presto [name] 03/11/2023 9:30 A M Medical Record Number: [id number] Patient Account Number: [id number] Date of Birth/Sex: Treating RN: 21-Jul-1940 (81 y.o. Richard [name] Primary Care Avangeline Stockburger: Seabron Spates Other Clinician: Referring Krystall Kruckenberg: Treating Gildo Crisco/Extender: Verner Mould in Treatment: 2 Active Inactive Wound/Skin Impairment Nursing Diagnoses: Impaired tissue integrity Goals: Patient/caregiver will verbalize understanding of skin care regimen Date Initiated: 02/25/2023 Target Resolution Date: 05/31/2023 Goal Status: Active Interventions: Assess ulceration(s) every visit Treatment Activities: Skin care regimen initiated : 02/25/2023 Notes: Electronic Signature(s) Signed: 03/11/2023 5:11:22 PM By: Karie Schwalbe RN Entered By: Karie Schwalbe on 03/11/2023 13:58:07 [name] ([id number]) [id number] Page 6 of 13 -------------------------------------------------------------------------------- Pain Assessment Details Patient Name: Date of Service: Richard [name] Alert [name] 03/11/2023 9:30 A M Medical Record Number: [id number] Patient Account Number: [id number] Date of Birth/Sex: Treating RN: 1941/04/26 (82 y.o. M) Primary  Care Adesuwa Osgood: Seabron Spates Other Clinician: Referring Kroy Sprung: Treating Roma Bondar/Extender: Verner Mould in Treatment: 2 Active Problems Location of Pain Severity and Description of Pain Patient Has Paino No Site Locations Pain Management and Medication Current Pain Management: Electronic Signature(s) Signed: 03/11/2023 10:07:39 AM By: Dayton Scrape Entered By: Dayton Scrape on 03/11/2023 06:19:24 -------------------------------------------------------------------------------- Patient/Caregiver Education Details Patient  Name: Date of Service: Billey Chang 9/10/2024andnbsp9:30 A M Medical Record Number: [id number] Patient Account Number: [id number] Date of Birth/Gender: Treating RN: 1940-08-24 (82 y.o. Richard [name] Primary Care Physician: Seabron Spates Other Clinician: Referring Physician: Treating Physician/Extender: Verner Mould in Treatment: 2 Education Assessment Education Provided To: Patient Education Topics Provided Electronic Signature(s) Signed: 03/11/2023 5:11:22 PM By: Karie Schwalbe RN KELLAR, Mariel Aloe ([id number]) [id number] Page 7 of 13 Entered By: Karie Schwalbe on 03/11/2023 13:58:15 -------------------------------------------------------------------------------- Wound Assessment Details Patient Name: Date of Service: Richard [name] Alert [name] 03/11/2023 9:30 A M Medical Record Number: [id number] Patient Account Number: [id number] Date of Birth/Sex: Treating RN: 24-Oct-1940 (82 y.o. M) Primary Care Lynden Carrithers: Seabron Spates Other Clinician: Referring Ronrico Dupin: Treating Rabab Currington/Extender: Verner Mould in Treatment: 2 Wound Status Wound Number: 11 Primary Diabetic Wound/Ulcer of the Lower Extremity Etiology: Wound Location: Left, Lateral, Plantar Foot Wound Healed - Epithelialized Wounding Event: Gradually  Appeared Status: Date Acquired: 02/24/2022 Comorbid Cataracts, Anemia, Sleep Apnea, Arrhythmia, Congestive Heart Weeks Of Treatment: 2 History: Failure, Coronary Artery Disease, Hypertension, Type II Diabetes, Clustered Wound: No Osteoarthritis, Neuropathy, Received Chemotherapy Photos Wound Measurements Length: (cm) Width: (cm) Depth: (cm) Area: (cm) Volume: (cm) 0 % Reduction in Area: 100% 0 % Reduction in Volume: 100% 0 Epithelialization: Large (67-100%) 0 Tunneling: No 0 Undermining: No Wound Description Classification: Grade 1 Wound Margin: Distinct, outline attached Exudate Amount: None Present Foul Odor After Cleansing: No Slough/Fibrino No Wound Bed Granulation Amount: None Present (0%) Necrotic Amount: None Present (0%) Periwound Skin Texture Texture Color No Abnormalities Noted: No No Abnormalities Noted: Yes Callus: No Temperature / Pain Temperature: No Abnormality Moisture No Abnormalities Noted: Yes Electronic Signature(s) Signed: 03/11/2023 5:11:22 PM By: Karie Schwalbe RN Entered By: Karie Schwalbe on 03/11/2023 07:16:35 [name] ([id number]) [id number] Page 8 of 13 -------------------------------------------------------------------------------- Wound Assessment Details Patient Name: Date of Service: Richard [name] Alert [name] 03/11/2023 9:30 A M Medical Record Number: [id number] Patient Account Number: [id number] Date of Birth/Sex: Treating RN: 1941-01-23 (82 y.o. M) Primary Care Traniece Boffa: Seabron Spates Other Clinician: Referring Joelle Flessner: Treating Shavona Gunderman/Extender: Verner Mould in Treatment: 2 Wound Status Wound Number: 13 Primary Trauma, Other Etiology: Wound Location: Right, Proximal, Anterior Lower Leg Wound Open Wounding Event: Skin Tear/Laceration Status: Date Acquired: 02/05/2023 Comorbid Cataracts, Anemia, Sleep Apnea, Arrhythmia, Congestive Heart Weeks Of Treatment:  2 History: Failure, Coronary Artery Disease, Hypertension, Type II Diabetes, Clustered Wound: No Osteoarthritis, Neuropathy, Received Chemotherapy Photos Wound Measurements Length: (cm) 2.7 Width: (cm) 1 Depth: (cm) 0.1 Area: (cm) 2.121 Volume: (cm) 0.212 % Reduction in Area: 59.1% % Reduction in Volume: 59.1% Epithelialization: Small (1-33%) Tunneling: No Undermining: No Wound Description Classification: Full Thickness Without Exposed Support Structures Exudate Amount: Medium Exudate Type: Serosanguineous Exudate Color: red, brown Foul Odor After Cleansing: No Slough/Fibrino Yes Wound Bed Granulation Amount: Medium (34-66%) Exposed Structure Granulation Quality: Red Fat Layer (Subcutaneous Tissue) Exposed: Yes Necrotic Amount: Medium (34-66%) Necrotic Quality: Eschar, Adherent Slough Periwound Skin Texture Texture Color No Abnormalities Noted: No No Abnormalities Noted: Yes Scarring: Yes Temperature / Pain Temperature: No Abnormality Moisture No Abnormalities Noted: Yes Treatment Notes Wound #13 (Lower Leg) Wound Laterality: Right, Anterior, Proximal Cleanser Soap and Water Discharge Instruction: May shower and wash wound with dial antibacterial soap and water prior to dressing change. Vashe 5.8 (oz) Discharge Instruction: Cleanse the wound with Vashe prior to applying a clean dressing using gauze sponges, not tissue or cotton balls. Wound Cleanser Discharge  Instruction: Cleanse the wound with wound cleanser prior to applying a clean dressing using gauze sponges, not tissue or cotton balls. JALIJAH, KOOPMANN ([id number]) [id number] Page 9 of 13 Peri-Wound Care Topical Primary Dressing Maxorb Extra Ag+ Alginate Dressing, 4x4.75 (in/in) Discharge Instruction: Apply to wound bed as instructed Secondary Dressing Woven Gauze Sponge, Non-Sterile 4x4 in Discharge Instruction: Apply over primary dressing as directed. Secured With Compression  Wrap Urgo K2 Lite, (equivalent to a 3 layer) two layer compression system, regular Discharge Instruction: Apply Urgo K2 Lite as directed (alternative to 3 layer compression). Tubular netting #5 Compression Stockings Add-Ons Electronic Signature(s) Signed: 03/11/2023 5:11:22 PM By: Karie Schwalbe RN Entered By: Karie Schwalbe on 03/11/2023 06:57:22 -------------------------------------------------------------------------------- Wound Assessment Details Patient Name: Date of Service: Richard [name] Alert [name] 03/11/2023 9:30 A M Medical Record Number: [id number] Patient Account Number: [id number] Date of Birth/Sex: Treating RN: 1940/11/07 (82 y.o. M) Primary Care Jeromy Borcherding: Seabron Spates Other Clinician: Referring Justis Closser: Treating Leahanna Buser/Extender: Verner Mould in Treatment: 2 Wound Status Wound Number: 14 Primary Trauma, Other Etiology: Wound Location: Right, Anterior Lower Leg Wound Healed - Epithelialized Wounding Event: Skin Tear/Laceration Status: Date Acquired: 02/05/2023 Comorbid Cataracts, Anemia, Sleep Apnea, Arrhythmia, Congestive Heart Weeks Of Treatment: 2 History: Failure, Coronary Artery Disease, Hypertension, Type II Diabetes, Clustered Wound: No Osteoarthritis, Neuropathy, Received Chemotherapy Photos Wound Measurements Length: (cm) Width: (cm) Depth: (cm) Area: (cm) Volume: (cm) 0 % Reduction in Area: 100% 0 % Reduction in Volume: 100% 0 Epithelialization: Large (67-100%) 0 Tunneling: No 0 Undermining: No Wound Description STOY, STROUSE ([id number]) Classification: Full Thickness Without Exposed Support Structures Wound Margin: Fibrotic scar, thickened scar Exudate Amount: None Present [phone].pdf Page 10 of 13 Foul Odor After Cleansing: No Slough/Fibrino No Wound Bed Granulation Amount: None Present (0%) Exposed Structure Necrotic Amount: None Present (0%) Fascia Exposed: No Fat Layer  (Subcutaneous Tissue) Exposed: No Tendon Exposed: No Muscle Exposed: No Joint Exposed: No Bone Exposed: No Periwound Skin Texture Texture Color No Abnormalities Noted: No No Abnormalities Noted: Yes Scarring: No Temperature / Pain Temperature: No Abnormality Moisture No Abnormalities Noted: Yes Electronic Signature(s) Signed: 03/11/2023 5:11:22 PM By: Karie Schwalbe RN Entered By: Karie Schwalbe on 03/11/2023 07:23:10 -------------------------------------------------------------------------------- Wound Assessment Details Patient Name: Date of Service: Richard [name] Alert [name] 03/11/2023 9:30 A M Medical Record Number: [id number] Patient Account Number: [id number] Date of Birth/Sex: Treating RN: Feb 08, 1941 (82 y.o. M) Primary Care Tyson Parkison: Seabron Spates Other Clinician: Referring Kerin Cecchi: Treating Ahmadou Bolz/Extender: Verner Mould in Treatment: 2 Wound Status Wound Number: 15 Primary Trauma, Other Etiology: Wound Location: Right, Medial Lower Leg Wound Open Wounding Event: Skin Tear/Laceration Status: Date Acquired: 02/05/2023 Comorbid Cataracts, Anemia, Sleep Apnea, Arrhythmia, Congestive Heart Weeks Of Treatment: 2 History: Failure, Coronary Artery Disease, Hypertension, Type II Diabetes, Clustered Wound: No Osteoarthritis, Neuropathy, Received Chemotherapy Photos Wound Measurements Length: (cm) 0.9 Width: (cm) 0.5 Depth: (cm) 0.1 Area: (cm) 0.353 Volume: (cm) 0.035 % Reduction in Area: -7% % Reduction in Volume: -6.1% Epithelialization: Small (1-33%) Tunneling: No Undermining: No Wound Description Classification: Full Thickness Without Exposed Support Structures ROBERTSON, MODGLIN ([id number]) Wound Margin: Fibrotic scar, thickened scar Exudate Amount: Medium Exudate Type: Serosanguineous Exudate Color: red, brown Foul Odor After Cleansing: No [id number] Page 11 of 13 Slough/Fibrino Yes Wound  Bed Granulation Amount: Medium (34-66%) Exposed Structure Granulation Quality: Red Fat Layer (Subcutaneous Tissue) Exposed: Yes Necrotic Amount: Medium (34-66%) Necrotic Quality: Eschar Periwound Skin Texture Texture Color No Abnormalities Noted: Yes No Abnormalities Noted: Yes Moisture  Temperature / Pain No Abnormalities Noted: Yes Temperature: No Abnormality Treatment Notes Wound #15 (Lower Leg) Wound Laterality: Right, Medial Cleanser Soap and Water Discharge Instruction: May shower and wash wound with dial antibacterial soap and water prior to dressing change. Vashe 5.8 (oz) Discharge Instruction: Cleanse the wound with Vashe prior to applying a clean dressing using gauze sponges, not tissue or cotton balls. Wound Cleanser Discharge Instruction: Cleanse the wound with wound cleanser prior to applying a clean dressing using gauze sponges, not tissue or cotton balls. Peri-Wound Care Topical Primary Dressing Maxorb Extra Ag+ Alginate Dressing, 4x4.75 (in/in) Discharge Instruction: Apply to wound bed as instructed Secondary Dressing Woven Gauze Sponge, Non-Sterile 4x4 in Discharge Instruction: Apply over primary dressing as directed. Secured With Compression Wrap Urgo K2 Lite, (equivalent to a 3 layer) two layer compression system, regular Discharge Instruction: Apply Urgo K2 Lite as directed (alternative to 3 layer compression). Tubular netting #5 Compression Stockings Add-Ons Electronic Signature(s) Signed: 03/11/2023 5:11:22 PM By: Karie Schwalbe RN Entered By: Karie Schwalbe on 03/11/2023 06:58:00 -------------------------------------------------------------------------------- Wound Assessment Details Patient Name: Date of Service: Richard [name] Alert [name] 03/11/2023 9:30 A M Medical Record Number: [id number] Patient Account Number: [id number] Date of Birth/Sex: Treating RN: 03/30/1941 (82 y.o. Richard [name] Primary Care Zayla Agar: Seabron Spates Other  Clinician: Referring Kerie Badger: Treating Ambur Province/Extender: Verner Mould in Treatment: 2 Wound Status DOLL, STELTZ ([id number]) [id number] Page 12 of 13 Wound Number: 16 Primary Trauma, Other Etiology: Wound Location: Left, Anterior Lower Leg Wound Open Wounding Event: Hematoma Status: Date Acquired: 03/08/2023 Comorbid Cataracts, Anemia, Sleep Apnea, Arrhythmia, Congestive Heart Weeks Of Treatment: 0 History: Failure, Coronary Artery Disease, Hypertension, Type II Diabetes, Clustered Wound: No Osteoarthritis, Neuropathy, Received Chemotherapy Photos Wound Measurements Length: (cm) 3 Width: (cm) 2 Depth: (cm) 0.1 Area: (cm) 4.712 Volume: (cm) 0.471 % Reduction in Area: % Reduction in Volume: Tunneling: No Undermining: No Wound Description Classification: Full Thickness Without Exposed Suppor Wound Margin: Distinct, outline attached Exudate Amount: Medium Exudate Type: Sanguinous Exudate Color: red t Structures Foul Odor After Cleansing: No Slough/Fibrino Yes Wound Bed Granulation Amount: Large (67-100%) Exposed Structure Granulation Quality: Red, Hyper-granulation Fat Layer (Subcutaneous Tissue) Exposed: Yes Necrotic Amount: Small (1-33%) Necrotic Quality: Eschar Periwound Skin Texture Texture Color No Abnormalities Noted: No No Abnormalities Noted: No Scarring: Yes Hemosiderin Staining: Yes Moisture Temperature / Pain No Abnormalities Noted: Yes Temperature: No Abnormality Treatment Notes Wound #16 (Lower Leg) Wound Laterality: Left, Anterior Cleanser Peri-Wound Care Sween Lotion (Moisturizing lotion) Discharge Instruction: Apply moisturizing lotion as directed Topical Primary Dressing Maxorb Extra Ag+ Alginate Dressing, 2x2 (in/in) Discharge Instruction: Apply to wound bed as instructed Secondary Dressing ABD Pad, 8x10 Discharge Instruction: Apply over primary dressing as directed. Woven Gauze  Sponge, Non-Sterile 4x4 in Discharge Instruction: Apply over primary dressing as directed. Secured With Elastic Bandage 4 inch (ACE bandage) Discharge Instruction: Secure with ACE bandage as directed. DONTRELLE, SIEKER ([id number]) [id number] Page 13 of 13 Kerlix Roll Sterile, 4.5x3.1 (in/yd) Discharge Instruction: Secure with Kerlix as directed. Paper Tape, 2x10 (in/yd) Discharge Instruction: Secure dressing with tape as directed. Compression Wrap Compression Stockings Add-Ons Electronic Signature(s) Signed: 03/11/2023 5:11:22 PM By: Karie Schwalbe RN Entered By: Karie Schwalbe on 03/11/2023 07:04:34 -------------------------------------------------------------------------------- Vitals Details Patient Name: Date of Service: Richard [name], Richard [name] Presto [name] 03/11/2023 9:30 A M Medical Record Number: [id number] Patient Account Number: [id number] Date of Birth/Sex: Treating RN: [birth year] (82 y.o. M) Primary Care Cylie Dor: Seabron Spates Other Clinician: Referring Katria Botts: Treating Itzayanna Kaster/Extender: Olivia Canter,  Mckinley Jewel in Treatment: 2 Vital Signs Time Taken: 09:18 Temperature (F): 97.8 Height (in): 72 Pulse (bpm): 77 Weight (lbs): 230 Respiratory Rate (breaths/min): 18 Body Mass Index (BMI): 31.2 Blood Pressure (mmHg): 116/79 Capillary Blood Glucose (mg/dl): 657 Reference Range: 80 - 120 mg / dl Electronic Signature(s) Signed: 03/11/2023 10:07:39 AM By: Dayton Scrape Entered By: Dayton Scrape on 03/11/2023 06:19:18

## 2023-03-13 DIAGNOSIS — I5042 Chronic combined systolic (congestive) and diastolic (congestive) heart failure: Secondary | ICD-10-CM | POA: Diagnosis not present

## 2023-03-13 DIAGNOSIS — E11621 Type 2 diabetes mellitus with foot ulcer: Secondary | ICD-10-CM | POA: Diagnosis not present

## 2023-03-13 DIAGNOSIS — D631 Anemia in chronic kidney disease: Secondary | ICD-10-CM | POA: Diagnosis not present

## 2023-03-13 DIAGNOSIS — L97428 Non-pressure chronic ulcer of left heel and midfoot with other specified severity: Secondary | ICD-10-CM | POA: Diagnosis not present

## 2023-03-13 DIAGNOSIS — S81811D Laceration without foreign body, right lower leg, subsequent encounter: Secondary | ICD-10-CM | POA: Diagnosis not present

## 2023-03-13 DIAGNOSIS — I110 Hypertensive heart disease with heart failure: Secondary | ICD-10-CM | POA: Diagnosis not present

## 2023-03-14 DIAGNOSIS — I480 Paroxysmal atrial fibrillation: Secondary | ICD-10-CM | POA: Diagnosis not present

## 2023-03-14 DIAGNOSIS — E785 Hyperlipidemia, unspecified: Secondary | ICD-10-CM | POA: Diagnosis not present

## 2023-03-14 DIAGNOSIS — K219 Gastro-esophageal reflux disease without esophagitis: Secondary | ICD-10-CM | POA: Diagnosis not present

## 2023-03-14 DIAGNOSIS — D61818 Other pancytopenia: Secondary | ICD-10-CM | POA: Diagnosis not present

## 2023-03-14 DIAGNOSIS — Z7984 Long term (current) use of oral hypoglycemic drugs: Secondary | ICD-10-CM | POA: Diagnosis not present

## 2023-03-14 DIAGNOSIS — I429 Cardiomyopathy, unspecified: Secondary | ICD-10-CM | POA: Diagnosis not present

## 2023-03-14 DIAGNOSIS — Z7901 Long term (current) use of anticoagulants: Secondary | ICD-10-CM | POA: Diagnosis not present

## 2023-03-14 DIAGNOSIS — G4733 Obstructive sleep apnea (adult) (pediatric): Secondary | ICD-10-CM | POA: Diagnosis not present

## 2023-03-14 DIAGNOSIS — Z7985 Long-term (current) use of injectable non-insulin antidiabetic drugs: Secondary | ICD-10-CM | POA: Diagnosis not present

## 2023-03-14 DIAGNOSIS — T82897A Other specified complication of cardiac prosthetic devices, implants and grafts, initial encounter: Secondary | ICD-10-CM | POA: Diagnosis not present

## 2023-03-14 DIAGNOSIS — I2510 Atherosclerotic heart disease of native coronary artery without angina pectoris: Secondary | ICD-10-CM | POA: Diagnosis not present

## 2023-03-14 DIAGNOSIS — I110 Hypertensive heart disease with heart failure: Secondary | ICD-10-CM | POA: Diagnosis not present

## 2023-03-14 DIAGNOSIS — I442 Atrioventricular block, complete: Secondary | ICD-10-CM | POA: Diagnosis not present

## 2023-03-14 DIAGNOSIS — E1142 Type 2 diabetes mellitus with diabetic polyneuropathy: Secondary | ICD-10-CM | POA: Diagnosis not present

## 2023-03-14 DIAGNOSIS — Z951 Presence of aortocoronary bypass graft: Secondary | ICD-10-CM | POA: Diagnosis not present

## 2023-03-14 DIAGNOSIS — Z953 Presence of xenogenic heart valve: Secondary | ICD-10-CM | POA: Diagnosis not present

## 2023-03-14 DIAGNOSIS — I5032 Chronic diastolic (congestive) heart failure: Secondary | ICD-10-CM | POA: Diagnosis not present

## 2023-03-14 DIAGNOSIS — Z886 Allergy status to analgesic agent status: Secondary | ICD-10-CM | POA: Diagnosis not present

## 2023-03-17 ENCOUNTER — Ambulatory Visit: Payer: Medicare Other | Admitting: Pharmacist

## 2023-03-17 DIAGNOSIS — I5042 Chronic combined systolic (congestive) and diastolic (congestive) heart failure: Secondary | ICD-10-CM

## 2023-03-17 DIAGNOSIS — E1122 Type 2 diabetes mellitus with diabetic chronic kidney disease: Secondary | ICD-10-CM

## 2023-03-17 NOTE — Progress Notes (Signed)
Pharmacy Note  03/17/2023 Name: Richard [name] MRN: [id number] DOB: 01/20/1941  Subjective: Richard [name] is a 82 y.o. year old male who is a primary care patient of Zola Button, Grayling Congress, DO. Clinical Pharmacist Practitioner referral was placed to assist with medication and diabetes management.    Engaged with patient by telephone for follow up visit today.  Patient reports that he will have pacemaker changed 04/01/2023. He will need to stop Trulicity 1 week prior to procedure (last dose 9/18). He will also hold Eliquis 2.5mg  48 hours prior to procedure.   Type 2 DM -  Current therapy - Trulicity 3mg  weekly, glimepiride 2mg  twice a day and Farxiga 10mg  daily.  Past therapies: metformin stopped due to rising Scr.  Initially recommended Ozempic instead of Trulicity but Trulicity was preferred by patient's insurance.   Tolerating Trulicity well.  Current home blood glucose readings 95 to 175 Denies any hypoglycemia events since our last visit.   Wt Readings from Last 3 Encounters:  03/07/23 234 lb 12.8 oz (106.5 kg)  02/18/23 239 lb 9.6 oz (108.7 kg)  01/30/23 239 lb (108.4 kg)   Weight 1 year ago 10/26/2021 was 251lbs.  (Has lost about 17 lbs since starting GLP1  Diet - has been able to control appetite better since starting Trulicity. He is limiting sweets and sweetened beverages. Sometimes has ice cream.  Breakfast - MWF when playing golf- cereal and banana; days he doesn't play golf he eats eggs and toast.  Lunch - grilled cheese sandwich + / - soup Dinner - meat with 2 vegetables - limits potatoes/  Likes green beans, slaw,   Uses Splenda  Exercise - golfs 9 to 18 holes 2 or 3 times per week. States he feels great.   CHF:  Patient is weighing daily. He will take an extra torsemide if needed for weight gain of more than 2 - 3 lbs in 24 hours or 5lbs in a week.  Reports he takes an extra torsemide about 2 times per week. He is concerned about running out of torsemide  early.  Current medications: torsemide 20mg   - 2 tabs = 40mg  daily, Farxiga 10mg  daily (for DM, CHF and CKD)  Medication management:  Current therapy: fenofibrate 160mg , atorvastatin 80mg  daiyl, ezetimibe 10mg  daily and icosappent ethyl - 2 grams twice a day. Enrolled in Hyperlipidemia and CHF / Cardiomyopathy [employer] funds which covers all his cholesterol medications and heart medications.   Objective: Review of patient status, including review of consultants reports, laboratory and other test data, was performed as part of comprehensive.  Lab Results  Component Value Date   CREATININE 1.79 (H) 03/07/2023   CREATININE 2.01 (H) 01/30/2023   CREATININE 2.50 (H) 12/24/2022    Lab Results  Component Value Date   HGBA1C 7.1 (H) 11/26/2022       Component Value Date/Time   CHOL 154 11/26/2022 1006   CHOL 162 11/23/2020 0828   TRIG 387.0 (H) 11/26/2022 1006   HDL 31.40 (L) 11/26/2022 1006   HDL 38 (L) 11/23/2020 0828   CHOLHDL 5 11/26/2022 1006   VLDL 77.4 (H) 11/26/2022 1006   LDLCALC 76 01/25/2022 1218   LDLCALC 89 11/23/2020 0828   LDLCALC  04/18/2020 0956     Comment:     . LDL cholesterol not calculated. Triglyceride levels greater than 400 mg/dL invalidate calculated LDL results. . Reference range: <100 . Desirable range <100 mg/dL for primary prevention;   <70 mg/dL for patients with  CHD or diabetic patients  with > or = 2 CHD risk factors. [name] LDL-C is now calculated using the Martin-Hopkins  calculation, which is a validated novel method providing  better accuracy than the Friedewald equation in the  estimation of LDL-C.  Horald Pollen et al. Lenox Ahr. 1610;960(45): 2061-2068  (http://education.[facility].com/faq/FAQ164)    LDLDIRECT 78.0 11/26/2022 1006     Clinical ASCVD: Yes  The ASCVD Risk score (Arnett DK, et al., 2019) failed to calculate for the following reasons:   The 2019 ASCVD risk score is only valid for ages 50 to 38   The patient has a prior  MI or stroke diagnosis    BP Readings from Last 3 Encounters:  03/07/23 128/80  02/18/23 128/72  02/04/23 113/66     Allergies  Allergen Reactions   Ace Inhibitors Swelling and Other (See Comments)    Angioedema    Benazepril Swelling and Other (See Comments)    Angioedema; he is not a candidate for any angiotensin receptor blockers because of this significant allergic reaction. Because of a history of documented adverse serious drug reaction;Medi Alert bracelet  is recommended   Entresto [Sacubitril-Valsartan] Swelling and Other (See Comments)    First-in-Class Angiotensin Receptor Neprilysin Inhibitor- Med was "red-flagged" by the patient's pharmacy for him to NOT take!   Hctz [Hydrochlorothiazide] Anaphylaxis and Swelling    Tongue and lip swelling    Aspirin Other (See Comments)    Gastritis, can aspirin not take 325 mg aspirin    Medications Reviewed Today   Medications were not reviewed in this encounter     Patient Active Problem List   Diagnosis Date Noted   Permanent atrial fibrillation (HCC) 10/23/2022   Simple chronic bronchitis (HCC) 06/03/2022   Cellulitis of lower extremity 04/30/2022   Type 2 DM with CKD stage 3 and hypertension (HCC) 04/30/2022   Cellulitis of right leg 04/24/2022   Stage 3b chronic kidney disease (CKD) (HCC) 04/24/2022   Left upper quadrant pain 04/08/2022   Subacute cough 04/08/2022   Chronic combined systolic and diastolic heart failure (HCC) 02/06/2022   Acute exacerbation of CHF (congestive heart failure) (HCC) 01/16/2022   Hemoptysis 12/26/2021   Subclavian vein stenosis 12/23/2021   Strep throat 11/16/2021   Multiple superficial wounds with infection 01/04/2021   Cardiac pacemaker in situ 11/20/2020   S/P CABG x 4 11/20/2020   Ascending aortic aneurysm (HCC) 11/20/2020   Senile purpura (HCC) 07/27/2020   Porokeratosis 06/20/2020   Pain due to onychomycosis of toenails of both feet 12/14/2019   Educated about COVID-19 virus  infection 11/25/2019   DM2 (diabetes mellitus, type 2) (HCC) 08/10/2019   Hyperlipidemia associated with type 2 diabetes mellitus (HCC) 08/10/2019   S/P left TKA 06/29/2019   Peripheral neuropathy 07/27/2018   Uncontrolled type 2 diabetes mellitus with hyperglycemia (HCC) 05/13/2018   Diabetic peripheral neuropathy associated with type 2 diabetes mellitus (HCC) 05/13/2018   Pansinusitis 05/13/2018   Severe aortic stenosis    Complete heart block (HCC)    Paroxysmal atrial fibrillation (HCC)    Acute on chronic diastolic CHF (congestive heart failure), NYHA class 2 (HCC)    Left arm swelling    Status post transcatheter aortic valve replacement (TAVR) using bioprosthesis 09/09/2017   Demand ischemia of myocardium    Typical atrial flutter (HCC)    Acute on chronic diastolic heart failure (HCC)    GIB (gastrointestinal bleeding) 08/16/2017   Hyperlipidemia 08/05/2017   NSTEMI (non-ST elevated myocardial infarction) (HCC)  Aortic stenosis, severe 07/25/2017   Pancytopenia (HCC) 07/24/2017   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) 02/27/2017   Bradycardia 01/04/2017   Coronary artery disease 01/04/2017   Stenosis of carotid artery 01/04/2017   Obesity 09/18/2016   Wenckebach block    Disorder associated with type II diabetes mellitus (HCC) 05/21/2013   Spinal stenosis of lumbar region 04/08/2013   Anemia, iron deficiency 11/29/2011   B12 deficiency anemia 07/30/2011   OSA (obstructive sleep apnea) 07/06/2010   CAD, ARTERY BYPASS GRAFT 08/22/2009   Essential hypertension 03/29/2009   Bilateral lower extremity edema 02/21/2009   Hyperlipidemia LDL goal <70 11/26/2008   GOUT 08/17/2006     Medication Assistance:   High med cost when in coverage gap. Screened for medication assistance program in past - patient did not qualify. [employer] for hypercholesterolemia. Approved for $2500 thru 06/03/2023  Patient reports he has gotten out of Medicare  coverage gap and is now in catastrophic coverage phase. Medications are $0 to $10.   Assessment / Plan: Type 2 DM - A1c now 7.1% Continue Trulicity 3mg  weekly, glimepiride 2mg  twice a day and Farxiga 10mg  daily.  Patient will hold Trulicity prior to up coming procedure to replace pacemaker. His blood glucose might increase during this time. He is to call office if blood glucose increased > 200.  Continue to check blood glucose once a day Reviewed s/s of hypoglycemia and how to treat.   CHF:  Continue to check weight daily and take extra torsemide as needed.  Continue to take [place].  Called [facility] to check on Rx for torsemide  Medication management:  Reviewed and updated medication list  Reviewed refill history and adherence Coordinated with [facility] regarding next fill for torsemide - they had profiled Rx for #205 tabs = 90 day supply. It can be filled 09/20/202. [facility] set for auto refill on 9/20. Patient endorses he has enough to last for about 7 to 10 days.    Follow Up:  Telephone follow up appointment with care management team member scheduled for:  1 to 2 months   Henrene Pastor, PharmD Clinical Pharmacist [facility]  - [facility] [place] [phone]

## 2023-03-18 ENCOUNTER — Encounter (HOSPITAL_BASED_OUTPATIENT_CLINIC_OR_DEPARTMENT_OTHER): Payer: Medicare Other | Admitting: General Surgery

## 2023-03-18 DIAGNOSIS — N1832 Chronic kidney disease, stage 3b: Secondary | ICD-10-CM | POA: Diagnosis not present

## 2023-03-18 DIAGNOSIS — L97812 Non-pressure chronic ulcer of other part of right lower leg with fat layer exposed: Secondary | ICD-10-CM | POA: Diagnosis not present

## 2023-03-18 DIAGNOSIS — S81802A Unspecified open wound, left lower leg, initial encounter: Secondary | ICD-10-CM | POA: Diagnosis not present

## 2023-03-18 DIAGNOSIS — S81801A Unspecified open wound, right lower leg, initial encounter: Secondary | ICD-10-CM | POA: Diagnosis not present

## 2023-03-18 DIAGNOSIS — L97522 Non-pressure chronic ulcer of other part of left foot with fat layer exposed: Secondary | ICD-10-CM | POA: Diagnosis not present

## 2023-03-18 DIAGNOSIS — I130 Hypertensive heart and chronic kidney disease with heart failure and stage 1 through stage 4 chronic kidney disease, or unspecified chronic kidney disease: Secondary | ICD-10-CM | POA: Diagnosis not present

## 2023-03-18 DIAGNOSIS — L97822 Non-pressure chronic ulcer of other part of left lower leg with fat layer exposed: Secondary | ICD-10-CM | POA: Diagnosis not present

## 2023-03-18 DIAGNOSIS — E11622 Type 2 diabetes mellitus with other skin ulcer: Secondary | ICD-10-CM | POA: Diagnosis not present

## 2023-03-18 NOTE — Progress Notes (Signed)
Richard [name], Richard [name] ([id number]) [id number] Page 1 of 13 Visit Report for 03/18/2023 Chief Complaint Document Details Patient Name: Date of Service: Richard [name] Richard L. 03/18/2023 9:30 A M Medical Record Number: [id number] Patient Account Number: [id number] Date of Birth/Sex: Treating RN: 1940/10/17 (82 y.o. M) Primary Care Provider: Seabron [name] Other Clinician: Referring Provider: Treating Provider/Extender: Richard [name] in Treatment: 3 Information Obtained from: Patient Chief Complaint 01/12/2021; Wounds to his left upper extremity and bilateral lower extremities. 05/14/2022; bilateral lower extremity wounds, right plantar foot wound 09/05/2022; bilateral lower extremity wounds, left thumb wound 02/25/2023: left foot DFU and right knee and RLE wounds Electronic Signature(s) Signed: 03/18/2023 10:46:40 AM By: Duanne Guess MD FACS Entered By: Duanne Guess on 03/18/2023 07:46:40 -------------------------------------------------------------------------------- Debridement Details Patient Name: Date of Service: Richard [name], Richard [name] Richard L. 03/18/2023 9:30 A M Medical Record Number: [id number] Patient Account Number: [id number] Date of Birth/Sex: Treating RN: 02/09/1941 (82 y.o. Richard [name] Primary Care Provider: Seabron [name] Other Clinician: Referring Provider: Treating Provider/Extender: Richard [name] in Treatment: 3 Debridement Performed for Assessment: Wound #13 Right,Proximal,Anterior Lower Leg Performed By: Physician Duanne Guess, MD The following information was scribed by: Karie Schwalbe The information was scribed for: Duanne Guess Debridement Type: Debridement Level of Consciousness (Pre-procedure): Awake and [name] Pre-procedure Verification/Time Out Yes - 10:18 Taken: Start Time: 10:18 Pain Control: Lidocaine 4% Topical Solution Percent of Wound Bed Debrided: 100% T  Area Debrided (cm): otal 1.38 Tissue and other material debrided: Non-Viable, Eschar Level: Non-Viable Tissue Debridement Description: Selective/Open Wound Instrument: Curette Bleeding: Minimum Hemostasis Achieved: Pressure End Time: 10:20 Procedural Pain: 0 Post Procedural Pain: 0 Response to Treatment: Procedure was tolerated well Level of Consciousness (Post- Awake and [name] procedure): Post Debridement Measurements of Total Wound Length: (cm) 2.2 Richard [name], Richard [name] ([id number]) [id number] Page 2 of 13 Width: (cm) 0.8 Depth: (cm) 0.1 Volume: (cm) 0.138 Character of Wound/Ulcer Post Debridement: Improved Post Procedure Diagnosis Same as Pre-procedure Electronic Signature(s) Signed: 03/18/2023 12:52:29 PM By: Duanne Guess MD FACS Signed: 03/18/2023 4:31:16 PM By: Karie Schwalbe RN Entered By: Karie Schwalbe on 03/18/2023 07:22:45 -------------------------------------------------------------------------------- Debridement Details Patient Name: Date of Service: Richard [name], Richard [name] Richard L. 03/18/2023 9:30 A M Medical Record Number: [id number] Patient Account Number: [id number] Date of Birth/Sex: Treating RN: 11-07-1940 (82 y.o. Richard [name] Primary Care Provider: Seabron [name] Other Clinician: Referring Provider: Treating Provider/Extender: Richard [name] in Treatment: 3 Debridement Performed for Assessment: Wound #15 Right,Medial Lower Leg Performed By: Physician Duanne Guess, MD The following information was scribed by: Karie Schwalbe The information was scribed for: Duanne Guess Debridement Type: Debridement Level of Consciousness (Pre-procedure): Awake and [name] Pre-procedure Verification/Time Out Yes - 10:18 Taken: Start Time: 10:18 Pain Control: Lidocaine 4% Topical Solution Percent of Wound Bed Debrided: 100% T Area Debrided (cm): otal 0.2 Tissue and other material debrided: Non-Viable, Eschar,  Slough, Slough Level: Non-Viable Tissue Debridement Description: Selective/Open Wound Instrument: Curette Bleeding: Minimum Hemostasis Achieved: Pressure End Time: 10:20 Procedural Pain: 0 Post Procedural Pain: 0 Response to Treatment: Procedure was tolerated well Level of Consciousness (Post- Awake and [name] procedure): Post Debridement Measurements of Total Wound Length: (cm) 0.5 Width: (cm) 0.5 Depth: (cm) 0.1 Volume: (cm) 0.02 Character of Wound/Ulcer Post Debridement: Improved Post Procedure Diagnosis Same as Pre-procedure Electronic Signature(s) Signed: 03/18/2023 12:52:29 PM By: Duanne Guess MD FACS Signed: 03/18/2023 4:31:16 PM By: Karie Schwalbe RN Entered By: Karie Schwalbe on 03/18/2023 07:23:23 Richard [name] ([id number]) [id number] Page 3 of  total of 4 wounds. Most recent hemoglobin A1c 7.1%. 03/05/2023: The wound on his right knee is healed. The right mid tibial wound is nearly healed under a layer of eschar. The more proximal and distal anterior tibial wounds are smaller. The wound on his left foot appears to have gotten some moisture under the perimeter dry skin and callus, but fortunately this has not resulted in further tissue breakdown. This wound is stable. 03/11/2023: The left foot wound has healed. He unfortunately dropped his phone and it struck his left anterior tibia resulting in a hematoma. The distal right anterior tibial surface wound has eschar and silver alginate stuck to the surface, underneath it is smaller. The same applies to the more proximal right anterior tibial wound. 03/18/2023: The left foot wound remains closed. The left anterior tibial wound is smaller with some slough accumulation. Both of the right leg wounds are smaller as well with some slough and eschar accumulation. Electronic Signature(s) Signed: 03/18/2023 10:47:19 AM By: Duanne Guess MD FACS Entered By: Duanne Guess on 03/18/2023 07:47:19 -------------------------------------------------------------------------------- Physical Exam Details Patient Name: Date of Service: Richard [name], Richard [name] Richard L. 03/18/2023 9:30 A M Medical Record Number:  [id number] Patient Account Number: [id number] Date of Birth/Sex: Treating RN: 1941-03-02 (82 y.o. Richard [name], Richard [name] ([id number]) [id number] Page 5 of 13 Primary Care Provider: Seabron [name] Other Clinician: Referring Provider: Treating Provider/Extender: Richard [name] in Treatment: 3 Constitutional Slightly hypertensive. . . . no acute distress. Respiratory Normal work of breathing on room air. Notes 03/18/2023: The left foot wound remains closed. The left anterior tibial wound is smaller with some slough accumulation. Both of the right leg wounds are smaller as well with some slough and eschar accumulation. Electronic Signature(s) Signed: 03/18/2023 10:47:47 AM By: Duanne Guess MD FACS Entered By: Duanne Guess on 03/18/2023 07:47:47 -------------------------------------------------------------------------------- Physician Orders Details Patient Name: Date of Service: Richard [name], Richard [name] Richard L. 03/18/2023 9:30 A M Medical Record Number: [id number] Patient Account Number: [id number] Date of Birth/Sex: Treating RN: 1941-02-14 (82 y.o. Richard [name] Primary Care Provider: Seabron [name] Other Clinician: Referring Provider: Treating Provider/Extender: Richard [name] in Treatment: 3 Verbal / Phone Orders: No Diagnosis Coding ICD-10 Coding Code Description [phone] Non-pressure chronic ulcer of other part of right lower leg with fat layer exposed L97.822 Non-pressure chronic ulcer of other part of left lower leg with fat layer exposed E11.622 Type 2 diabetes mellitus with other skin ulcer I50.42 Chronic combined systolic (congestive) and diastolic (congestive) heart failure N18.32 Chronic kidney disease, stage 3b R60.0 Localized edema Follow-up Appointments ppointment in 1 week. - Dr. Lady Gary Room 3 -03/25/23 at 9:30am Return A ppointment in 2 weeks. - Please ask front desk to schedule an  appointment Return A Return appointment in 3 weeks. - Please ask front desk to schedule an appointment Return appointment in 1 month. - Please ask front desk to schedule an appointment Anesthetic (In clinic) Topical Lidocaine 5% applied to wound bed Bathing/ Shower/ Hygiene May shower and wash wound with soap and water. - Avoid getting moisture on wounds-the wraps on the Right leg Edema Control - Lymphedema / SCD / Other Bilateral Lower Extremities Elevate legs to the level of the heart or above for 30 minutes daily and/or when sitting for 3-4 times a day throughout the day. Avoid standing for long periods of time. Exercise regularly - As T olerated Moisturize legs daily. Home Health Discontinue home health for wound care. - Discontinue Home health -[facility] Wound Treatment Wound #  total of 4 wounds. Most recent hemoglobin A1c 7.1%. 03/05/2023: The wound on his right knee is healed. The right mid tibial wound is nearly healed under a layer of eschar. The more proximal and distal anterior tibial wounds are smaller. The wound on his left foot appears to have gotten some moisture under the perimeter dry skin and callus, but fortunately this has not resulted in further tissue breakdown. This wound is stable. 03/11/2023: The left foot wound has healed. He unfortunately dropped his phone and it struck his left anterior tibia resulting in a hematoma. The distal right anterior tibial surface wound has eschar and silver alginate stuck to the surface, underneath it is smaller. The same applies to the more proximal right anterior tibial wound. 03/18/2023: The left foot wound remains closed. The left anterior tibial wound is smaller with some slough accumulation. Both of the right leg wounds are smaller as well with some slough and eschar accumulation. Electronic Signature(s) Signed: 03/18/2023 10:47:19 AM By: Duanne Guess MD FACS Entered By: Duanne Guess on 03/18/2023 07:47:19 -------------------------------------------------------------------------------- Physical Exam Details Patient Name: Date of Service: Richard [name], Richard [name] Richard L. 03/18/2023 9:30 A M Medical Record Number:  [id number] Patient Account Number: [id number] Date of Birth/Sex: Treating RN: 1941-03-02 (82 y.o. Richard [name], Richard [name] ([id number]) [id number] Page 5 of 13 Primary Care Provider: Seabron [name] Other Clinician: Referring Provider: Treating Provider/Extender: Richard [name] in Treatment: 3 Constitutional Slightly hypertensive. . . . no acute distress. Respiratory Normal work of breathing on room air. Notes 03/18/2023: The left foot wound remains closed. The left anterior tibial wound is smaller with some slough accumulation. Both of the right leg wounds are smaller as well with some slough and eschar accumulation. Electronic Signature(s) Signed: 03/18/2023 10:47:47 AM By: Duanne Guess MD FACS Entered By: Duanne Guess on 03/18/2023 07:47:47 -------------------------------------------------------------------------------- Physician Orders Details Patient Name: Date of Service: Richard [name], Richard [name] Richard L. 03/18/2023 9:30 A M Medical Record Number: [id number] Patient Account Number: [id number] Date of Birth/Sex: Treating RN: 1941-02-14 (82 y.o. Richard [name] Primary Care Provider: Seabron [name] Other Clinician: Referring Provider: Treating Provider/Extender: Richard [name] in Treatment: 3 Verbal / Phone Orders: No Diagnosis Coding ICD-10 Coding Code Description [phone] Non-pressure chronic ulcer of other part of right lower leg with fat layer exposed L97.822 Non-pressure chronic ulcer of other part of left lower leg with fat layer exposed E11.622 Type 2 diabetes mellitus with other skin ulcer I50.42 Chronic combined systolic (congestive) and diastolic (congestive) heart failure N18.32 Chronic kidney disease, stage 3b R60.0 Localized edema Follow-up Appointments ppointment in 1 week. - Dr. Lady Gary Room 3 -03/25/23 at 9:30am Return A ppointment in 2 weeks. - Please ask front desk to schedule an  appointment Return A Return appointment in 3 weeks. - Please ask front desk to schedule an appointment Return appointment in 1 month. - Please ask front desk to schedule an appointment Anesthetic (In clinic) Topical Lidocaine 5% applied to wound bed Bathing/ Shower/ Hygiene May shower and wash wound with soap and water. - Avoid getting moisture on wounds-the wraps on the Right leg Edema Control - Lymphedema / SCD / Other Bilateral Lower Extremities Elevate legs to the level of the heart or above for 30 minutes daily and/or when sitting for 3-4 times a day throughout the day. Avoid standing for long periods of time. Exercise regularly - As T olerated Moisturize legs daily. Home Health Discontinue home health for wound care. - Discontinue Home health -[facility] Wound Treatment Wound #  total of 4 wounds. Most recent hemoglobin A1c 7.1%. 03/05/2023: The wound on his right knee is healed. The right mid tibial wound is nearly healed under a layer of eschar. The more proximal and distal anterior tibial wounds are smaller. The wound on his left foot appears to have gotten some moisture under the perimeter dry skin and callus, but fortunately this has not resulted in further tissue breakdown. This wound is stable. 03/11/2023: The left foot wound has healed. He unfortunately dropped his phone and it struck his left anterior tibia resulting in a hematoma. The distal right anterior tibial surface wound has eschar and silver alginate stuck to the surface, underneath it is smaller. The same applies to the more proximal right anterior tibial wound. 03/18/2023: The left foot wound remains closed. The left anterior tibial wound is smaller with some slough accumulation. Both of the right leg wounds are smaller as well with some slough and eschar accumulation. Electronic Signature(s) Signed: 03/18/2023 10:47:19 AM By: Duanne Guess MD FACS Entered By: Duanne Guess on 03/18/2023 07:47:19 -------------------------------------------------------------------------------- Physical Exam Details Patient Name: Date of Service: Richard [name], Richard [name] Richard L. 03/18/2023 9:30 A M Medical Record Number:  [id number] Patient Account Number: [id number] Date of Birth/Sex: Treating RN: 1941-03-02 (82 y.o. Richard [name], Richard [name] ([id number]) [id number] Page 5 of 13 Primary Care Provider: Seabron [name] Other Clinician: Referring Provider: Treating Provider/Extender: Richard [name] in Treatment: 3 Constitutional Slightly hypertensive. . . . no acute distress. Respiratory Normal work of breathing on room air. Notes 03/18/2023: The left foot wound remains closed. The left anterior tibial wound is smaller with some slough accumulation. Both of the right leg wounds are smaller as well with some slough and eschar accumulation. Electronic Signature(s) Signed: 03/18/2023 10:47:47 AM By: Duanne Guess MD FACS Entered By: Duanne Guess on 03/18/2023 07:47:47 -------------------------------------------------------------------------------- Physician Orders Details Patient Name: Date of Service: Richard [name], Richard [name] Richard L. 03/18/2023 9:30 A M Medical Record Number: [id number] Patient Account Number: [id number] Date of Birth/Sex: Treating RN: 1941-02-14 (82 y.o. Richard [name] Primary Care Provider: Seabron [name] Other Clinician: Referring Provider: Treating Provider/Extender: Richard [name] in Treatment: 3 Verbal / Phone Orders: No Diagnosis Coding ICD-10 Coding Code Description [phone] Non-pressure chronic ulcer of other part of right lower leg with fat layer exposed L97.822 Non-pressure chronic ulcer of other part of left lower leg with fat layer exposed E11.622 Type 2 diabetes mellitus with other skin ulcer I50.42 Chronic combined systolic (congestive) and diastolic (congestive) heart failure N18.32 Chronic kidney disease, stage 3b R60.0 Localized edema Follow-up Appointments ppointment in 1 week. - Dr. Lady Gary Room 3 -03/25/23 at 9:30am Return A ppointment in 2 weeks. - Please ask front desk to schedule an  appointment Return A Return appointment in 3 weeks. - Please ask front desk to schedule an appointment Return appointment in 1 month. - Please ask front desk to schedule an appointment Anesthetic (In clinic) Topical Lidocaine 5% applied to wound bed Bathing/ Shower/ Hygiene May shower and wash wound with soap and water. - Avoid getting moisture on wounds-the wraps on the Right leg Edema Control - Lymphedema / SCD / Other Bilateral Lower Extremities Elevate legs to the level of the heart or above for 30 minutes daily and/or when sitting for 3-4 times a day throughout the day. Avoid standing for long periods of time. Exercise regularly - As T olerated Moisturize legs daily. Home Health Discontinue home health for wound care. - Discontinue Home health -[facility] Wound Treatment Wound #  Richard [name], Richard [name] ([id number]) [id number] Page 1 of 13 Visit Report for 03/18/2023 Chief Complaint Document Details Patient Name: Date of Service: Richard [name] Richard L. 03/18/2023 9:30 A M Medical Record Number: [id number] Patient Account Number: [id number] Date of Birth/Sex: Treating RN: 1940/10/17 (82 y.o. M) Primary Care Provider: Seabron [name] Other Clinician: Referring Provider: Treating Provider/Extender: Richard [name] in Treatment: 3 Information Obtained from: Patient Chief Complaint 01/12/2021; Wounds to his left upper extremity and bilateral lower extremities. 05/14/2022; bilateral lower extremity wounds, right plantar foot wound 09/05/2022; bilateral lower extremity wounds, left thumb wound 02/25/2023: left foot DFU and right knee and RLE wounds Electronic Signature(s) Signed: 03/18/2023 10:46:40 AM By: Duanne Guess MD FACS Entered By: Duanne Guess on 03/18/2023 07:46:40 -------------------------------------------------------------------------------- Debridement Details Patient Name: Date of Service: Richard [name], Richard [name] Richard L. 03/18/2023 9:30 A M Medical Record Number: [id number] Patient Account Number: [id number] Date of Birth/Sex: Treating RN: 02/09/1941 (82 y.o. Richard [name] Primary Care Provider: Seabron [name] Other Clinician: Referring Provider: Treating Provider/Extender: Richard [name] in Treatment: 3 Debridement Performed for Assessment: Wound #13 Right,Proximal,Anterior Lower Leg Performed By: Physician Duanne Guess, MD The following information was scribed by: Karie Schwalbe The information was scribed for: Duanne Guess Debridement Type: Debridement Level of Consciousness (Pre-procedure): Awake and [name] Pre-procedure Verification/Time Out Yes - 10:18 Taken: Start Time: 10:18 Pain Control: Lidocaine 4% Topical Solution Percent of Wound Bed Debrided: 100% T  Area Debrided (cm): otal 1.38 Tissue and other material debrided: Non-Viable, Eschar Level: Non-Viable Tissue Debridement Description: Selective/Open Wound Instrument: Curette Bleeding: Minimum Hemostasis Achieved: Pressure End Time: 10:20 Procedural Pain: 0 Post Procedural Pain: 0 Response to Treatment: Procedure was tolerated well Level of Consciousness (Post- Awake and [name] procedure): Post Debridement Measurements of Total Wound Length: (cm) 2.2 Richard [name], Richard [name] ([id number]) [id number] Page 2 of 13 Width: (cm) 0.8 Depth: (cm) 0.1 Volume: (cm) 0.138 Character of Wound/Ulcer Post Debridement: Improved Post Procedure Diagnosis Same as Pre-procedure Electronic Signature(s) Signed: 03/18/2023 12:52:29 PM By: Duanne Guess MD FACS Signed: 03/18/2023 4:31:16 PM By: Karie Schwalbe RN Entered By: Karie Schwalbe on 03/18/2023 07:22:45 -------------------------------------------------------------------------------- Debridement Details Patient Name: Date of Service: Richard [name], Richard [name] Richard L. 03/18/2023 9:30 A M Medical Record Number: [id number] Patient Account Number: [id number] Date of Birth/Sex: Treating RN: 11-07-1940 (82 y.o. Richard [name] Primary Care Provider: Seabron [name] Other Clinician: Referring Provider: Treating Provider/Extender: Richard [name] in Treatment: 3 Debridement Performed for Assessment: Wound #15 Right,Medial Lower Leg Performed By: Physician Duanne Guess, MD The following information was scribed by: Karie Schwalbe The information was scribed for: Duanne Guess Debridement Type: Debridement Level of Consciousness (Pre-procedure): Awake and [name] Pre-procedure Verification/Time Out Yes - 10:18 Taken: Start Time: 10:18 Pain Control: Lidocaine 4% Topical Solution Percent of Wound Bed Debrided: 100% T Area Debrided (cm): otal 0.2 Tissue and other material debrided: Non-Viable, Eschar,  Slough, Slough Level: Non-Viable Tissue Debridement Description: Selective/Open Wound Instrument: Curette Bleeding: Minimum Hemostasis Achieved: Pressure End Time: 10:20 Procedural Pain: 0 Post Procedural Pain: 0 Response to Treatment: Procedure was tolerated well Level of Consciousness (Post- Awake and [name] procedure): Post Debridement Measurements of Total Wound Length: (cm) 0.5 Width: (cm) 0.5 Depth: (cm) 0.1 Volume: (cm) 0.02 Character of Wound/Ulcer Post Debridement: Improved Post Procedure Diagnosis Same as Pre-procedure Electronic Signature(s) Signed: 03/18/2023 12:52:29 PM By: Duanne Guess MD FACS Signed: 03/18/2023 4:31:16 PM By: Karie Schwalbe RN Entered By: Karie Schwalbe on 03/18/2023 07:23:23 Richard [name] ([id number]) [id number] Page 3 of  13 - Lower Leg Wound Laterality: Right, Anterior, Proximal Cleanser: Soap and Water 1 x Per Week/30 Days Discharge Instructions: May shower and wash wound with dial antibacterial soap and water prior to dressing change. Richard [name], Richard [name] ([id number]) [id number] Page 6 of 13 Cleanser: Vashe 5.8 (oz) 1 x Per Week/30 Days Discharge Instructions: Cleanse the wound with Vashe prior to applying a clean dressing using gauze sponges, not tissue or cotton balls. Cleanser: Wound Cleanser 1 x Per Week/30 Days Discharge Instructions: Cleanse the wound with wound cleanser prior to applying a clean dressing using gauze sponges, not tissue or cotton balls. Prim Dressing: Maxorb Extra Ag+ Alginate Dressing, 4x4.75 (in/in) (Generic) 1 x Per Week/30 Days ary Discharge Instructions: Apply to wound bed as instructed Secondary Dressing: Woven Gauze Sponge, Non-Sterile 4x4 in (Generic) 1 x Per Week/30 Days Discharge Instructions: Apply over primary dressing as directed. Compression Wrap: Urgo K2 Lite, (equivalent to a 3 layer) two layer compression system, regular (Generic) 1 x Per Week/30 Days Discharge  Instructions: Apply Urgo K2 Lite as directed (alternative to 3 layer compression). Compression Wrap: Tubular netting #5 1 x Per Week/30 Days Wound #15 - Lower Leg Wound Laterality: Right, Medial Cleanser: Soap and Water 1 x Per Week/30 Days Discharge Instructions: May shower and wash wound with dial antibacterial soap and water prior to dressing change. Cleanser: Vashe 5.8 (oz) 1 x Per Week/30 Days Discharge Instructions: Cleanse the wound with Vashe prior to applying a clean dressing using gauze sponges, not tissue or cotton balls. Cleanser: Wound Cleanser 1 x Per Week/30 Days Discharge Instructions: Cleanse the wound with wound cleanser prior to applying a clean dressing using gauze sponges, not tissue or cotton balls. Prim Dressing: Maxorb Extra Ag+ Alginate Dressing, 4x4.75 (in/in) (Generic) 1 x Per Week/30 Days ary Discharge Instructions: Apply to wound bed as instructed Secondary Dressing: Woven Gauze Sponge, Non-Sterile 4x4 in (Generic) 1 x Per Week/30 Days Discharge Instructions: Apply over primary dressing as directed. Compression Wrap: Urgo K2 Lite, (equivalent to a 3 layer) two layer compression system, regular (Generic) 1 x Per Week/30 Days Discharge Instructions: Apply Urgo K2 Lite as directed (alternative to 3 layer compression). Compression Wrap: Tubular netting #5 (Generic) 1 x Per Week/30 Days Wound #16 - Lower Leg Wound Laterality: Left, Anterior Peri-Wound Care: Sween Lotion (Moisturizing lotion) 3 x Per Week/30 Days Discharge Instructions: Apply moisturizing lotion as directed Prim Dressing: Maxorb Extra Ag+ Alginate Dressing, 2x2 (in/in) 3 x Per Week/30 Days ary Discharge Instructions: Apply to wound bed as instructed Secondary Dressing: ABD Pad, 8x10 3 x Per Week/30 Days Discharge Instructions: Apply over primary dressing as directed. Secondary Dressing: Woven Gauze Sponge, Non-Sterile 4x4 in 3 x Per Week/30 Days Discharge Instructions: Apply over primary dressing as  directed. Secured With: Transpore Surgical Tape, 2x10 (in/yd) 3 x Per Week/30 Days Discharge Instructions: Secure dressing with tape as directed. Compression Wrap: Urgo K2 Lite, (equivalent to a 3 layer) two layer compression system, regular 3 x Per Week/30 Days Discharge Instructions: Apply Urgo K2 Lite as directed (alternative to 3 layer compression). Compression Wrap: Netting #5 3 x Per Week/30 Days Electronic Signature(s) Signed: 03/18/2023 12:52:29 PM By: Duanne Guess MD FACS Entered By: Duanne Guess on 03/18/2023 07:48:08 -------------------------------------------------------------------------------- Problem List Details Patient Name: Date of Service: Richard [name], Richard [name] Richard L. 03/18/2023 9:30 A Richard [name] ([id number]) [id number] Page 7 of 13 Medical Record Number: [id number] Patient Account Number: [id number] Date of Birth/Sex: Treating RN: July 05, 1940 (82 y.o. M) Primary Care Provider: Seabron [name]  13 - Lower Leg Wound Laterality: Right, Anterior, Proximal Cleanser: Soap and Water 1 x Per Week/30 Days Discharge Instructions: May shower and wash wound with dial antibacterial soap and water prior to dressing change. Richard [name], Richard [name] ([id number]) [id number] Page 6 of 13 Cleanser: Vashe 5.8 (oz) 1 x Per Week/30 Days Discharge Instructions: Cleanse the wound with Vashe prior to applying a clean dressing using gauze sponges, not tissue or cotton balls. Cleanser: Wound Cleanser 1 x Per Week/30 Days Discharge Instructions: Cleanse the wound with wound cleanser prior to applying a clean dressing using gauze sponges, not tissue or cotton balls. Prim Dressing: Maxorb Extra Ag+ Alginate Dressing, 4x4.75 (in/in) (Generic) 1 x Per Week/30 Days ary Discharge Instructions: Apply to wound bed as instructed Secondary Dressing: Woven Gauze Sponge, Non-Sterile 4x4 in (Generic) 1 x Per Week/30 Days Discharge Instructions: Apply over primary dressing as directed. Compression Wrap: Urgo K2 Lite, (equivalent to a 3 layer) two layer compression system, regular (Generic) 1 x Per Week/30 Days Discharge  Instructions: Apply Urgo K2 Lite as directed (alternative to 3 layer compression). Compression Wrap: Tubular netting #5 1 x Per Week/30 Days Wound #15 - Lower Leg Wound Laterality: Right, Medial Cleanser: Soap and Water 1 x Per Week/30 Days Discharge Instructions: May shower and wash wound with dial antibacterial soap and water prior to dressing change. Cleanser: Vashe 5.8 (oz) 1 x Per Week/30 Days Discharge Instructions: Cleanse the wound with Vashe prior to applying a clean dressing using gauze sponges, not tissue or cotton balls. Cleanser: Wound Cleanser 1 x Per Week/30 Days Discharge Instructions: Cleanse the wound with wound cleanser prior to applying a clean dressing using gauze sponges, not tissue or cotton balls. Prim Dressing: Maxorb Extra Ag+ Alginate Dressing, 4x4.75 (in/in) (Generic) 1 x Per Week/30 Days ary Discharge Instructions: Apply to wound bed as instructed Secondary Dressing: Woven Gauze Sponge, Non-Sterile 4x4 in (Generic) 1 x Per Week/30 Days Discharge Instructions: Apply over primary dressing as directed. Compression Wrap: Urgo K2 Lite, (equivalent to a 3 layer) two layer compression system, regular (Generic) 1 x Per Week/30 Days Discharge Instructions: Apply Urgo K2 Lite as directed (alternative to 3 layer compression). Compression Wrap: Tubular netting #5 (Generic) 1 x Per Week/30 Days Wound #16 - Lower Leg Wound Laterality: Left, Anterior Peri-Wound Care: Sween Lotion (Moisturizing lotion) 3 x Per Week/30 Days Discharge Instructions: Apply moisturizing lotion as directed Prim Dressing: Maxorb Extra Ag+ Alginate Dressing, 2x2 (in/in) 3 x Per Week/30 Days ary Discharge Instructions: Apply to wound bed as instructed Secondary Dressing: ABD Pad, 8x10 3 x Per Week/30 Days Discharge Instructions: Apply over primary dressing as directed. Secondary Dressing: Woven Gauze Sponge, Non-Sterile 4x4 in 3 x Per Week/30 Days Discharge Instructions: Apply over primary dressing as  directed. Secured With: Transpore Surgical Tape, 2x10 (in/yd) 3 x Per Week/30 Days Discharge Instructions: Secure dressing with tape as directed. Compression Wrap: Urgo K2 Lite, (equivalent to a 3 layer) two layer compression system, regular 3 x Per Week/30 Days Discharge Instructions: Apply Urgo K2 Lite as directed (alternative to 3 layer compression). Compression Wrap: Netting #5 3 x Per Week/30 Days Electronic Signature(s) Signed: 03/18/2023 12:52:29 PM By: Duanne Guess MD FACS Entered By: Duanne Guess on 03/18/2023 07:48:08 -------------------------------------------------------------------------------- Problem List Details Patient Name: Date of Service: Richard [name], Richard [name] Richard L. 03/18/2023 9:30 A Richard [name] ([id number]) [id number] Page 7 of 13 Medical Record Number: [id number] Patient Account Number: [id number] Date of Birth/Sex: Treating RN: July 05, 1940 (82 y.o. M) Primary Care Provider: Seabron [name]  Richard [name], Richard [name] ([id number]) [id number] Page 1 of 13 Visit Report for 03/18/2023 Chief Complaint Document Details Patient Name: Date of Service: Richard [name] Richard L. 03/18/2023 9:30 A M Medical Record Number: [id number] Patient Account Number: [id number] Date of Birth/Sex: Treating RN: 1940/10/17 (82 y.o. M) Primary Care Provider: Seabron [name] Other Clinician: Referring Provider: Treating Provider/Extender: Richard [name] in Treatment: 3 Information Obtained from: Patient Chief Complaint 01/12/2021; Wounds to his left upper extremity and bilateral lower extremities. 05/14/2022; bilateral lower extremity wounds, right plantar foot wound 09/05/2022; bilateral lower extremity wounds, left thumb wound 02/25/2023: left foot DFU and right knee and RLE wounds Electronic Signature(s) Signed: 03/18/2023 10:46:40 AM By: Duanne Guess MD FACS Entered By: Duanne Guess on 03/18/2023 07:46:40 -------------------------------------------------------------------------------- Debridement Details Patient Name: Date of Service: Richard [name], Richard [name] Richard L. 03/18/2023 9:30 A M Medical Record Number: [id number] Patient Account Number: [id number] Date of Birth/Sex: Treating RN: 02/09/1941 (82 y.o. Richard [name] Primary Care Provider: Seabron [name] Other Clinician: Referring Provider: Treating Provider/Extender: Richard [name] in Treatment: 3 Debridement Performed for Assessment: Wound #13 Right,Proximal,Anterior Lower Leg Performed By: Physician Duanne Guess, MD The following information was scribed by: Karie Schwalbe The information was scribed for: Duanne Guess Debridement Type: Debridement Level of Consciousness (Pre-procedure): Awake and [name] Pre-procedure Verification/Time Out Yes - 10:18 Taken: Start Time: 10:18 Pain Control: Lidocaine 4% Topical Solution Percent of Wound Bed Debrided: 100% T  Area Debrided (cm): otal 1.38 Tissue and other material debrided: Non-Viable, Eschar Level: Non-Viable Tissue Debridement Description: Selective/Open Wound Instrument: Curette Bleeding: Minimum Hemostasis Achieved: Pressure End Time: 10:20 Procedural Pain: 0 Post Procedural Pain: 0 Response to Treatment: Procedure was tolerated well Level of Consciousness (Post- Awake and [name] procedure): Post Debridement Measurements of Total Wound Length: (cm) 2.2 Richard [name], Richard [name] ([id number]) [id number] Page 2 of 13 Width: (cm) 0.8 Depth: (cm) 0.1 Volume: (cm) 0.138 Character of Wound/Ulcer Post Debridement: Improved Post Procedure Diagnosis Same as Pre-procedure Electronic Signature(s) Signed: 03/18/2023 12:52:29 PM By: Duanne Guess MD FACS Signed: 03/18/2023 4:31:16 PM By: Karie Schwalbe RN Entered By: Karie Schwalbe on 03/18/2023 07:22:45 -------------------------------------------------------------------------------- Debridement Details Patient Name: Date of Service: Richard [name], Richard [name] Richard L. 03/18/2023 9:30 A M Medical Record Number: [id number] Patient Account Number: [id number] Date of Birth/Sex: Treating RN: 11-07-1940 (82 y.o. Richard [name] Primary Care Provider: Seabron [name] Other Clinician: Referring Provider: Treating Provider/Extender: Richard [name] in Treatment: 3 Debridement Performed for Assessment: Wound #15 Right,Medial Lower Leg Performed By: Physician Duanne Guess, MD The following information was scribed by: Karie Schwalbe The information was scribed for: Duanne Guess Debridement Type: Debridement Level of Consciousness (Pre-procedure): Awake and [name] Pre-procedure Verification/Time Out Yes - 10:18 Taken: Start Time: 10:18 Pain Control: Lidocaine 4% Topical Solution Percent of Wound Bed Debrided: 100% T Area Debrided (cm): otal 0.2 Tissue and other material debrided: Non-Viable, Eschar,  Slough, Slough Level: Non-Viable Tissue Debridement Description: Selective/Open Wound Instrument: Curette Bleeding: Minimum Hemostasis Achieved: Pressure End Time: 10:20 Procedural Pain: 0 Post Procedural Pain: 0 Response to Treatment: Procedure was tolerated well Level of Consciousness (Post- Awake and [name] procedure): Post Debridement Measurements of Total Wound Length: (cm) 0.5 Width: (cm) 0.5 Depth: (cm) 0.1 Volume: (cm) 0.02 Character of Wound/Ulcer Post Debridement: Improved Post Procedure Diagnosis Same as Pre-procedure Electronic Signature(s) Signed: 03/18/2023 12:52:29 PM By: Duanne Guess MD FACS Signed: 03/18/2023 4:31:16 PM By: Karie Schwalbe RN Entered By: Karie Schwalbe on 03/18/2023 07:23:23 Richard [name] ([id number]) [id number] Page 3 of  Other Clinician: Referring Provider: Treating Provider/Extender: Richard [name] in Treatment: 3 Active Problems ICD-10 Encounter Code Description Active Date MDM Diagnosis L97.812 Non-pressure chronic ulcer of other part of right lower leg with fat layer 02/25/2023 No Yes exposed L97.822 Non-pressure chronic ulcer of other part of left lower leg with fat layer exposed9/04/2023 No Yes E11.622 Type 2 diabetes mellitus with other skin ulcer 02/25/2023 No Yes I50.42 Chronic combined systolic (congestive) and diastolic (congestive) heart failure 02/25/2023 No Yes N18.32 Chronic kidney disease, stage 3b 02/25/2023 No Yes R60.0 Localized edema 02/25/2023 No Yes Inactive Problems ICD-10 Code Description Active Date Inactive Date E11.621 Type 2 diabetes mellitus with foot ulcer 02/25/2023 02/25/2023 Resolved Problems ICD-10 Code Description Active Date Resolved Date L97.522 Non-pressure chronic ulcer of other part of left  foot with fat layer exposed 02/25/2023 02/25/2023 Electronic Signature(s) Signed: 03/18/2023 10:45:56 AM By: Duanne Guess MD FACS Entered By: Duanne Guess on 03/18/2023 07:45:55 -------------------------------------------------------------------------------- Progress Note Details Patient Name: Date of Service: Richard [name], Richard [name] Richard L. 03/18/2023 9:30 A M Medical Record Number: [id number] Patient Account Number: [id number] Date of Birth/Sex: Treating RN: [birth year] (82 y.o. M) Primary Care Provider: Seabron [name] Other Clinician: Referring Provider: Treating Provider/Extender: Richard [name] in Treatment: 3 Richard [name], Richard [name] ([id number]) [id number] Page 8 of 13 Subjective Chief Complaint Information obtained from Patient 01/12/2021; Wounds to his left upper extremity and bilateral lower extremities. 05/14/2022; bilateral lower extremity wounds, right plantar foot wound 09/05/2022; bilateral lower extremity wounds, left thumb wound 02/25/2023: left foot DFU and right knee and RLE wounds History of Present Illness (HPI) Admission 7/15 Mr. Oluwadamilare Daquino is an 82 year old male with a past medical history of type 2 diabetes on oral agents, diffuse large B-cell lymphoma, and coronary artery disease status post CABG that presents to the clinic for wounds to his left upper extremity and bilateral lower extremities. He states that he went to urgent care to be evaluated for a fever. And he was instructed to go to the ED to be further evaluated for possible sepsis. Unfortunately he passed out in his car After his appointment and was not discovered for another 3 hours. He was eventually discovered and EMS had to take him out of his vehicle. This is when he developed abrasions to his left arm and legs. He has been using Xeroform to his wound beds every other day. He reports improvement to all the wounds except for the one on the back of his right leg.  He denies pain or signs of infection. 7/22; patient presents for 1 week follow-up. He has no issues or complaints today. He has tolerated the compression wrap well on his right lower extremity. He has been able to do the dressing changes without issues to his left leg and left arm. His left arm wound has healed. He denies signs of infection. 7/29; patient presents for 1 week follow-up. He reports that his left leg wound is healed. He tolerated the compression wrap well on his right leg. He has no issues or complaints today. He denies signs of infection. 8/4; patient presents for 1 week follow-up. He continues to see improvement in wound healing to his right lower extremity. He has no issues or complaints today. He denies signs of infection. He does state that he went to the dermatologist and had some lesions removed on his face and left leg. He has been keeping these covered with a Band-Aid. 8/19; patient presents for follow-up. He is tolerated the compression wrap  13 - Lower Leg Wound Laterality: Right, Anterior, Proximal Cleanser: Soap and Water 1 x Per Week/30 Days Discharge Instructions: May shower and wash wound with dial antibacterial soap and water prior to dressing change. Richard [name], Richard [name] ([id number]) [id number] Page 6 of 13 Cleanser: Vashe 5.8 (oz) 1 x Per Week/30 Days Discharge Instructions: Cleanse the wound with Vashe prior to applying a clean dressing using gauze sponges, not tissue or cotton balls. Cleanser: Wound Cleanser 1 x Per Week/30 Days Discharge Instructions: Cleanse the wound with wound cleanser prior to applying a clean dressing using gauze sponges, not tissue or cotton balls. Prim Dressing: Maxorb Extra Ag+ Alginate Dressing, 4x4.75 (in/in) (Generic) 1 x Per Week/30 Days ary Discharge Instructions: Apply to wound bed as instructed Secondary Dressing: Woven Gauze Sponge, Non-Sterile 4x4 in (Generic) 1 x Per Week/30 Days Discharge Instructions: Apply over primary dressing as directed. Compression Wrap: Urgo K2 Lite, (equivalent to a 3 layer) two layer compression system, regular (Generic) 1 x Per Week/30 Days Discharge  Instructions: Apply Urgo K2 Lite as directed (alternative to 3 layer compression). Compression Wrap: Tubular netting #5 1 x Per Week/30 Days Wound #15 - Lower Leg Wound Laterality: Right, Medial Cleanser: Soap and Water 1 x Per Week/30 Days Discharge Instructions: May shower and wash wound with dial antibacterial soap and water prior to dressing change. Cleanser: Vashe 5.8 (oz) 1 x Per Week/30 Days Discharge Instructions: Cleanse the wound with Vashe prior to applying a clean dressing using gauze sponges, not tissue or cotton balls. Cleanser: Wound Cleanser 1 x Per Week/30 Days Discharge Instructions: Cleanse the wound with wound cleanser prior to applying a clean dressing using gauze sponges, not tissue or cotton balls. Prim Dressing: Maxorb Extra Ag+ Alginate Dressing, 4x4.75 (in/in) (Generic) 1 x Per Week/30 Days ary Discharge Instructions: Apply to wound bed as instructed Secondary Dressing: Woven Gauze Sponge, Non-Sterile 4x4 in (Generic) 1 x Per Week/30 Days Discharge Instructions: Apply over primary dressing as directed. Compression Wrap: Urgo K2 Lite, (equivalent to a 3 layer) two layer compression system, regular (Generic) 1 x Per Week/30 Days Discharge Instructions: Apply Urgo K2 Lite as directed (alternative to 3 layer compression). Compression Wrap: Tubular netting #5 (Generic) 1 x Per Week/30 Days Wound #16 - Lower Leg Wound Laterality: Left, Anterior Peri-Wound Care: Sween Lotion (Moisturizing lotion) 3 x Per Week/30 Days Discharge Instructions: Apply moisturizing lotion as directed Prim Dressing: Maxorb Extra Ag+ Alginate Dressing, 2x2 (in/in) 3 x Per Week/30 Days ary Discharge Instructions: Apply to wound bed as instructed Secondary Dressing: ABD Pad, 8x10 3 x Per Week/30 Days Discharge Instructions: Apply over primary dressing as directed. Secondary Dressing: Woven Gauze Sponge, Non-Sterile 4x4 in 3 x Per Week/30 Days Discharge Instructions: Apply over primary dressing as  directed. Secured With: Transpore Surgical Tape, 2x10 (in/yd) 3 x Per Week/30 Days Discharge Instructions: Secure dressing with tape as directed. Compression Wrap: Urgo K2 Lite, (equivalent to a 3 layer) two layer compression system, regular 3 x Per Week/30 Days Discharge Instructions: Apply Urgo K2 Lite as directed (alternative to 3 layer compression). Compression Wrap: Netting #5 3 x Per Week/30 Days Electronic Signature(s) Signed: 03/18/2023 12:52:29 PM By: Duanne Guess MD FACS Entered By: Duanne Guess on 03/18/2023 07:48:08 -------------------------------------------------------------------------------- Problem List Details Patient Name: Date of Service: Richard [name], Richard [name] Richard L. 03/18/2023 9:30 A Richard [name] ([id number]) [id number] Page 7 of 13 Medical Record Number: [id number] Patient Account Number: [id number] Date of Birth/Sex: Treating RN: July 05, 1940 (82 y.o. M) Primary Care Provider: Seabron [name]  Other Clinician: Referring Provider: Treating Provider/Extender: Richard [name] in Treatment: 3 Active Problems ICD-10 Encounter Code Description Active Date MDM Diagnosis L97.812 Non-pressure chronic ulcer of other part of right lower leg with fat layer 02/25/2023 No Yes exposed L97.822 Non-pressure chronic ulcer of other part of left lower leg with fat layer exposed9/04/2023 No Yes E11.622 Type 2 diabetes mellitus with other skin ulcer 02/25/2023 No Yes I50.42 Chronic combined systolic (congestive) and diastolic (congestive) heart failure 02/25/2023 No Yes N18.32 Chronic kidney disease, stage 3b 02/25/2023 No Yes R60.0 Localized edema 02/25/2023 No Yes Inactive Problems ICD-10 Code Description Active Date Inactive Date E11.621 Type 2 diabetes mellitus with foot ulcer 02/25/2023 02/25/2023 Resolved Problems ICD-10 Code Description Active Date Resolved Date L97.522 Non-pressure chronic ulcer of other part of left  foot with fat layer exposed 02/25/2023 02/25/2023 Electronic Signature(s) Signed: 03/18/2023 10:45:56 AM By: Duanne Guess MD FACS Entered By: Duanne Guess on 03/18/2023 07:45:55 -------------------------------------------------------------------------------- Progress Note Details Patient Name: Date of Service: Richard [name], Richard [name] Richard L. 03/18/2023 9:30 A M Medical Record Number: [id number] Patient Account Number: [id number] Date of Birth/Sex: Treating RN: [birth year] (82 y.o. M) Primary Care Provider: Seabron [name] Other Clinician: Referring Provider: Treating Provider/Extender: Richard [name] in Treatment: 3 Richard [name], Richard [name] ([id number]) [id number] Page 8 of 13 Subjective Chief Complaint Information obtained from Patient 01/12/2021; Wounds to his left upper extremity and bilateral lower extremities. 05/14/2022; bilateral lower extremity wounds, right plantar foot wound 09/05/2022; bilateral lower extremity wounds, left thumb wound 02/25/2023: left foot DFU and right knee and RLE wounds History of Present Illness (HPI) Admission 7/15 Mr. Oluwadamilare Daquino is an 82 year old male with a past medical history of type 2 diabetes on oral agents, diffuse large B-cell lymphoma, and coronary artery disease status post CABG that presents to the clinic for wounds to his left upper extremity and bilateral lower extremities. He states that he went to urgent care to be evaluated for a fever. And he was instructed to go to the ED to be further evaluated for possible sepsis. Unfortunately he passed out in his car After his appointment and was not discovered for another 3 hours. He was eventually discovered and EMS had to take him out of his vehicle. This is when he developed abrasions to his left arm and legs. He has been using Xeroform to his wound beds every other day. He reports improvement to all the wounds except for the one on the back of his right leg.  He denies pain or signs of infection. 7/22; patient presents for 1 week follow-up. He has no issues or complaints today. He has tolerated the compression wrap well on his right lower extremity. He has been able to do the dressing changes without issues to his left leg and left arm. His left arm wound has healed. He denies signs of infection. 7/29; patient presents for 1 week follow-up. He reports that his left leg wound is healed. He tolerated the compression wrap well on his right leg. He has no issues or complaints today. He denies signs of infection. 8/4; patient presents for 1 week follow-up. He continues to see improvement in wound healing to his right lower extremity. He has no issues or complaints today. He denies signs of infection. He does state that he went to the dermatologist and had some lesions removed on his face and left leg. He has been keeping these covered with a Band-Aid. 8/19; patient presents for follow-up. He is tolerated the compression wrap  13 - Lower Leg Wound Laterality: Right, Anterior, Proximal Cleanser: Soap and Water 1 x Per Week/30 Days Discharge Instructions: May shower and wash wound with dial antibacterial soap and water prior to dressing change. Richard [name], Richard [name] ([id number]) [id number] Page 6 of 13 Cleanser: Vashe 5.8 (oz) 1 x Per Week/30 Days Discharge Instructions: Cleanse the wound with Vashe prior to applying a clean dressing using gauze sponges, not tissue or cotton balls. Cleanser: Wound Cleanser 1 x Per Week/30 Days Discharge Instructions: Cleanse the wound with wound cleanser prior to applying a clean dressing using gauze sponges, not tissue or cotton balls. Prim Dressing: Maxorb Extra Ag+ Alginate Dressing, 4x4.75 (in/in) (Generic) 1 x Per Week/30 Days ary Discharge Instructions: Apply to wound bed as instructed Secondary Dressing: Woven Gauze Sponge, Non-Sterile 4x4 in (Generic) 1 x Per Week/30 Days Discharge Instructions: Apply over primary dressing as directed. Compression Wrap: Urgo K2 Lite, (equivalent to a 3 layer) two layer compression system, regular (Generic) 1 x Per Week/30 Days Discharge  Instructions: Apply Urgo K2 Lite as directed (alternative to 3 layer compression). Compression Wrap: Tubular netting #5 1 x Per Week/30 Days Wound #15 - Lower Leg Wound Laterality: Right, Medial Cleanser: Soap and Water 1 x Per Week/30 Days Discharge Instructions: May shower and wash wound with dial antibacterial soap and water prior to dressing change. Cleanser: Vashe 5.8 (oz) 1 x Per Week/30 Days Discharge Instructions: Cleanse the wound with Vashe prior to applying a clean dressing using gauze sponges, not tissue or cotton balls. Cleanser: Wound Cleanser 1 x Per Week/30 Days Discharge Instructions: Cleanse the wound with wound cleanser prior to applying a clean dressing using gauze sponges, not tissue or cotton balls. Prim Dressing: Maxorb Extra Ag+ Alginate Dressing, 4x4.75 (in/in) (Generic) 1 x Per Week/30 Days ary Discharge Instructions: Apply to wound bed as instructed Secondary Dressing: Woven Gauze Sponge, Non-Sterile 4x4 in (Generic) 1 x Per Week/30 Days Discharge Instructions: Apply over primary dressing as directed. Compression Wrap: Urgo K2 Lite, (equivalent to a 3 layer) two layer compression system, regular (Generic) 1 x Per Week/30 Days Discharge Instructions: Apply Urgo K2 Lite as directed (alternative to 3 layer compression). Compression Wrap: Tubular netting #5 (Generic) 1 x Per Week/30 Days Wound #16 - Lower Leg Wound Laterality: Left, Anterior Peri-Wound Care: Sween Lotion (Moisturizing lotion) 3 x Per Week/30 Days Discharge Instructions: Apply moisturizing lotion as directed Prim Dressing: Maxorb Extra Ag+ Alginate Dressing, 2x2 (in/in) 3 x Per Week/30 Days ary Discharge Instructions: Apply to wound bed as instructed Secondary Dressing: ABD Pad, 8x10 3 x Per Week/30 Days Discharge Instructions: Apply over primary dressing as directed. Secondary Dressing: Woven Gauze Sponge, Non-Sterile 4x4 in 3 x Per Week/30 Days Discharge Instructions: Apply over primary dressing as  directed. Secured With: Transpore Surgical Tape, 2x10 (in/yd) 3 x Per Week/30 Days Discharge Instructions: Secure dressing with tape as directed. Compression Wrap: Urgo K2 Lite, (equivalent to a 3 layer) two layer compression system, regular 3 x Per Week/30 Days Discharge Instructions: Apply Urgo K2 Lite as directed (alternative to 3 layer compression). Compression Wrap: Netting #5 3 x Per Week/30 Days Electronic Signature(s) Signed: 03/18/2023 12:52:29 PM By: Duanne Guess MD FACS Entered By: Duanne Guess on 03/18/2023 07:48:08 -------------------------------------------------------------------------------- Problem List Details Patient Name: Date of Service: Richard [name], Richard [name] Richard L. 03/18/2023 9:30 A Richard [name] ([id number]) [id number] Page 7 of 13 Medical Record Number: [id number] Patient Account Number: [id number] Date of Birth/Sex: Treating RN: July 05, 1940 (82 y.o. M) Primary Care Provider: Seabron [name]

## 2023-03-18 NOTE — Progress Notes (Signed)
Richard [name], Richard [name] ([id number]) [id number] Page 1 of 11 Visit Report for 03/18/2023 Arrival Information Details Patient Name: Date of Service: Richard Alert [name] 03/18/2023 9:30 A M Medical Record Number: [id number] Patient Account Number: [id number] Date of Birth/Sex: Treating RN: Sep 13, 1940 (82 y.o. Dianna Limbo Primary Care Dwyane Dupree: Seabron Spates Other Clinician: Referring Maliki Gignac: Treating Byron Tipping/Extender: Verner Mould in Treatment: 3 Visit Information History Since Last Visit All ordered tests and consults were completed: No Patient Arrived: Ambulatory Added or deleted any medications: No Arrival Time: 10:01 Any new allergies or adverse reactions: No Accompanied By: Self Had a fall or experienced change in No Transfer Assistance: None activities of daily living that may affect Patient Has Alerts: Yes risk of falls: Patient Alerts: Patient on Blood Thinner Signs or symptoms of abuse/neglect since last visito No Eliquis Hospitalized since last visit: No Pain Present Now: No Electronic Signature(s) Signed: 03/18/2023 4:31:16 PM By: Karie Schwalbe RN Entered By: Karie Schwalbe on 03/18/2023 07:02:17 -------------------------------------------------------------------------------- Compression Therapy Details Patient Name: Date of Service: Richard Alert [name] 03/18/2023 9:30 A M Medical Record Number: [id number] Patient Account Number: [id number] Date of Birth/Sex: Treating RN: Nov 20, 1940 (82 y.o. Dianna Limbo Primary Care Zaley Talley: Seabron Spates Other Clinician: Referring Malai Lady: Treating Zane Pellecchia/Extender: Verner Mould in Treatment: 3 Compression Therapy Performed for Wound Assessment: Wound #13 Right,Proximal,Anterior Lower Leg Performed By: Clinician Karie Schwalbe, RN Compression Type: Three Layer Post Procedure Diagnosis Same as Pre-procedure Electronic  Signature(s) Signed: 03/18/2023 4:31:16 PM By: Karie Schwalbe RN Entered By: Karie Schwalbe on 03/18/2023 07:25:14 -------------------------------------------------------------------------------- Compression Therapy Details Patient Name: Date of Service: Richard [name], [name] 03/18/2023 9:30 A M Medical Record Number: [id number] Patient Account Number: [id number] Richard [name], Richard [name] ([id number]) [id number] Page 2 of 11 Date of Birth/Sex: Treating RN: 1940/07/31 (82 y.o. Dianna Limbo Primary Care Laterica Matarazzo: Other Clinician: Seabron Spates Referring Celenia Hruska: Treating Nehemiah Montee/Extender: Verner Mould in Treatment: 3 Compression Therapy Performed for Wound Assessment: Wound #15 Right,Medial Lower Leg Performed By: Clinician Karie Schwalbe, RN Compression Type: Three Layer Post Procedure Diagnosis Same as Pre-procedure Electronic Signature(s) Signed: 03/18/2023 4:31:16 PM By: Karie Schwalbe RN Entered By: Karie Schwalbe on 03/18/2023 07:25:14 -------------------------------------------------------------------------------- Compression Therapy Details Patient Name: Date of Service: Richard Alert [name] 03/18/2023 9:30 A M Medical Record Number: [id number] Patient Account Number: [id number] Date of Birth/Sex: Treating RN: Jun 16, 1941 (82 y.o. Dianna Limbo Primary Care Ikenna Ohms: Seabron Spates Other Clinician: Referring Dalessandro Baldyga: Treating Gonsalo Cuthbertson/Extender: Verner Mould in Treatment: 3 Compression Therapy Performed for Wound Assessment: Wound #16 Left,Anterior Lower Leg Performed By: Clinician Karie Schwalbe, RN Compression Type: Three Layer Post Procedure Diagnosis Same as Pre-procedure Electronic Signature(s) Signed: 03/18/2023 4:31:16 PM By: Karie Schwalbe RN Entered By: Karie Schwalbe on 03/18/2023  07:25:14 -------------------------------------------------------------------------------- Encounter Discharge Information Details Patient Name: Date of Service: Richard [name], [name] 03/18/2023 9:30 A M Medical Record Number: [id number] Patient Account Number: [id number] Date of Birth/Sex: Treating RN: July 26, 1940 (82 y.o. Dianna Limbo Primary Care Kalum Minner: Seabron Spates Other Clinician: Referring Kesley Mullens: Treating Ankur Snowdon/Extender: Verner Mould in Treatment: 3 Encounter Discharge Information Items Post Procedure Vitals Discharge Condition: Stable Temperature (F): 98.7 Ambulatory Status: Ambulatory Pulse (bpm): 87 Discharge Destination: Home Respiratory Rate (breaths/min): 18 Transportation: Private Auto Blood Pressure (mmHg): 147/88 Accompanied By: self Schedule Follow-up Appointment: Yes Clinical Summary of Care: Patient Declined Electronic Signature(s) Signed: 03/18/2023 4:31:16 PM By: Karie Schwalbe RN Entered By: Karie Schwalbe on  Richard [name], Richard [name] ([id number]) [id number] Page 1 of 11 Visit Report for 03/18/2023 Arrival Information Details Patient Name: Date of Service: Richard Alert [name] 03/18/2023 9:30 A M Medical Record Number: [id number] Patient Account Number: [id number] Date of Birth/Sex: Treating RN: Sep 13, 1940 (82 y.o. Dianna Limbo Primary Care Dwyane Dupree: Seabron Spates Other Clinician: Referring Maliki Gignac: Treating Byron Tipping/Extender: Verner Mould in Treatment: 3 Visit Information History Since Last Visit All ordered tests and consults were completed: No Patient Arrived: Ambulatory Added or deleted any medications: No Arrival Time: 10:01 Any new allergies or adverse reactions: No Accompanied By: Self Had a fall or experienced change in No Transfer Assistance: None activities of daily living that may affect Patient Has Alerts: Yes risk of falls: Patient Alerts: Patient on Blood Thinner Signs or symptoms of abuse/neglect since last visito No Eliquis Hospitalized since last visit: No Pain Present Now: No Electronic Signature(s) Signed: 03/18/2023 4:31:16 PM By: Karie Schwalbe RN Entered By: Karie Schwalbe on 03/18/2023 07:02:17 -------------------------------------------------------------------------------- Compression Therapy Details Patient Name: Date of Service: Richard Alert [name] 03/18/2023 9:30 A M Medical Record Number: [id number] Patient Account Number: [id number] Date of Birth/Sex: Treating RN: Nov 20, 1940 (82 y.o. Dianna Limbo Primary Care Zaley Talley: Seabron Spates Other Clinician: Referring Malai Lady: Treating Zane Pellecchia/Extender: Verner Mould in Treatment: 3 Compression Therapy Performed for Wound Assessment: Wound #13 Right,Proximal,Anterior Lower Leg Performed By: Clinician Karie Schwalbe, RN Compression Type: Three Layer Post Procedure Diagnosis Same as Pre-procedure Electronic  Signature(s) Signed: 03/18/2023 4:31:16 PM By: Karie Schwalbe RN Entered By: Karie Schwalbe on 03/18/2023 07:25:14 -------------------------------------------------------------------------------- Compression Therapy Details Patient Name: Date of Service: Richard [name], [name] 03/18/2023 9:30 A M Medical Record Number: [id number] Patient Account Number: [id number] Richard [name], Richard [name] ([id number]) [id number] Page 2 of 11 Date of Birth/Sex: Treating RN: 1940/07/31 (82 y.o. Dianna Limbo Primary Care Laterica Matarazzo: Other Clinician: Seabron Spates Referring Celenia Hruska: Treating Nehemiah Montee/Extender: Verner Mould in Treatment: 3 Compression Therapy Performed for Wound Assessment: Wound #15 Right,Medial Lower Leg Performed By: Clinician Karie Schwalbe, RN Compression Type: Three Layer Post Procedure Diagnosis Same as Pre-procedure Electronic Signature(s) Signed: 03/18/2023 4:31:16 PM By: Karie Schwalbe RN Entered By: Karie Schwalbe on 03/18/2023 07:25:14 -------------------------------------------------------------------------------- Compression Therapy Details Patient Name: Date of Service: Richard Alert [name] 03/18/2023 9:30 A M Medical Record Number: [id number] Patient Account Number: [id number] Date of Birth/Sex: Treating RN: Jun 16, 1941 (82 y.o. Dianna Limbo Primary Care Ikenna Ohms: Seabron Spates Other Clinician: Referring Dalessandro Baldyga: Treating Gonsalo Cuthbertson/Extender: Verner Mould in Treatment: 3 Compression Therapy Performed for Wound Assessment: Wound #16 Left,Anterior Lower Leg Performed By: Clinician Karie Schwalbe, RN Compression Type: Three Layer Post Procedure Diagnosis Same as Pre-procedure Electronic Signature(s) Signed: 03/18/2023 4:31:16 PM By: Karie Schwalbe RN Entered By: Karie Schwalbe on 03/18/2023  07:25:14 -------------------------------------------------------------------------------- Encounter Discharge Information Details Patient Name: Date of Service: Richard [name], [name] 03/18/2023 9:30 A M Medical Record Number: [id number] Patient Account Number: [id number] Date of Birth/Sex: Treating RN: July 26, 1940 (82 y.o. Dianna Limbo Primary Care Kalum Minner: Seabron Spates Other Clinician: Referring Kesley Mullens: Treating Ankur Snowdon/Extender: Verner Mould in Treatment: 3 Encounter Discharge Information Items Post Procedure Vitals Discharge Condition: Stable Temperature (F): 98.7 Ambulatory Status: Ambulatory Pulse (bpm): 87 Discharge Destination: Home Respiratory Rate (breaths/min): 18 Transportation: Private Auto Blood Pressure (mmHg): 147/88 Accompanied By: self Schedule Follow-up Appointment: Yes Clinical Summary of Care: Patient Declined Electronic Signature(s) Signed: 03/18/2023 4:31:16 PM By: Karie Schwalbe RN Entered By: Karie Schwalbe on  Support Structures Exudate Amount: Medium Exudate Type: Serosanguineous Exudate Color: red, brown Foul Odor After Cleansing: No Slough/Fibrino Yes Wound Bed Granulation Amount: Medium (34-66%) Exposed Structure Granulation Quality: Red Fat Layer (Subcutaneous Tissue) Exposed: Yes Necrotic Amount: Medium (34-66%) Necrotic Quality: Eschar, Adherent Slough Periwound Skin Texture Texture Color No Abnormalities Noted: No No Abnormalities Noted: Yes Scarring: Yes Temperature / Pain Temperature: No Abnormality Moisture No Abnormalities Noted: Yes Treatment Notes Wound #13 (Lower Leg) Wound Laterality: Right, Anterior, Proximal Cleanser Soap and Water Discharge Instruction: May shower and wash wound with dial antibacterial soap and water prior to dressing change. Vashe 5.8  (oz) Discharge Instruction: Cleanse the wound with Vashe prior to applying a clean dressing using gauze sponges, not tissue or cotton balls. Wound Cleanser Discharge Instruction: Cleanse the wound with wound cleanser prior to applying a clean dressing using gauze sponges, not tissue or cotton balls. Peri-Wound Care Topical Primary Dressing Maxorb Extra Ag+ Alginate Dressing, 4x4.75 (in/in) Discharge Instruction: Apply to wound bed as instructed Secondary Dressing Woven Gauze Sponge, Non-Sterile 4x4 in ESIAH, COFFELL ([id number]) [id number] Page 8 of 11 Discharge Instruction: Apply over primary dressing as directed. Secured With Compression Wrap Urgo K2 Lite, (equivalent to a 3 layer) two layer compression system, regular Discharge Instruction: Apply Urgo K2 Lite as directed (alternative to 3 layer compression). Tubular netting #5 Compression Stockings Add-Ons Electronic Signature(s) Signed: 03/18/2023 4:31:16 PM By: Karie Schwalbe RN Entered By: Karie Schwalbe on 03/18/2023 07:07:57 -------------------------------------------------------------------------------- Wound Assessment Details Patient Name: Date of Service: Richard Alert [name] 03/18/2023 9:30 A M Medical Record Number: [id number] Patient Account Number: [id number] Date of Birth/Sex: Treating RN: [birth year] (82 y.o. Dianna Limbo Primary Care Jae Bruck: Seabron Spates Other Clinician: Referring Gildardo Tickner: Treating Anais Koenen/Extender: Verner Mould in Treatment: 3 Wound Status Wound Number: 15 Primary Trauma, Other Etiology: Wound Location: Right, Medial Lower Leg Wound Open Wounding Event: Skin Tear/Laceration Status: Date Acquired: 02/05/2023 Comorbid Cataracts, Anemia, Sleep Apnea, Arrhythmia, Congestive Heart Weeks Of Treatment: 3 History: Failure, Coronary Artery Disease, Hypertension, Type II Diabetes, Clustered Wound: No Osteoarthritis, Neuropathy,  Received Chemotherapy Photos Wound Measurements Length: (cm) 0.5 Width: (cm) 0.5 Depth: (cm) 0.1 Area: (cm) 0.196 Volume: (cm) 0.02 % Reduction in Area: 40.6% % Reduction in Volume: 39.4% Epithelialization: Small (1-33%) Tunneling: No Undermining: No Wound Description Classification: Full Thickness Without Exposed Support Structures Wound Margin: Fibrotic scar, thickened scar Exudate Amount: Medium Exudate Type: Serosanguineous Exudate Color: red, brown Foul Odor After Cleansing: No Slough/Fibrino Yes Wound Bed Granulation Amount: Medium (34-66%) Exposed Structure Granulation Quality: Red Fat Layer (Subcutaneous Tissue) Exposed: Yes Necrotic Amount: Medium (34-66%) Richard [name], Richard [name] ([id number]) [id number] Page 9 of 11 Necrotic Quality: Eschar Periwound Skin Texture Texture Color No Abnormalities Noted: Yes No Abnormalities Noted: Yes Moisture Temperature / Pain No Abnormalities Noted: Yes Temperature: No Abnormality Treatment Notes Wound #15 (Lower Leg) Wound Laterality: Right, Medial Cleanser Soap and Water Discharge Instruction: May shower and wash wound with dial antibacterial soap and water prior to dressing change. Vashe 5.8 (oz) Discharge Instruction: Cleanse the wound with Vashe prior to applying a clean dressing using gauze sponges, not tissue or cotton balls. Wound Cleanser Discharge Instruction: Cleanse the wound with wound cleanser prior to applying a clean dressing using gauze sponges, not tissue or cotton balls. Peri-Wound Care Topical Primary Dressing Maxorb Extra Ag+ Alginate Dressing, 4x4.75 (in/in) Discharge Instruction: Apply to wound bed as instructed Secondary Dressing Woven Gauze Sponge, Non-Sterile 4x4 in Discharge Instruction: Apply over primary dressing as directed. Secured  Richard [name], Richard [name] ([id number]) [id number] Page 1 of 11 Visit Report for 03/18/2023 Arrival Information Details Patient Name: Date of Service: Richard Alert [name] 03/18/2023 9:30 A M Medical Record Number: [id number] Patient Account Number: [id number] Date of Birth/Sex: Treating RN: Sep 13, 1940 (82 y.o. Dianna Limbo Primary Care Dwyane Dupree: Seabron Spates Other Clinician: Referring Maliki Gignac: Treating Byron Tipping/Extender: Verner Mould in Treatment: 3 Visit Information History Since Last Visit All ordered tests and consults were completed: No Patient Arrived: Ambulatory Added or deleted any medications: No Arrival Time: 10:01 Any new allergies or adverse reactions: No Accompanied By: Self Had a fall or experienced change in No Transfer Assistance: None activities of daily living that may affect Patient Has Alerts: Yes risk of falls: Patient Alerts: Patient on Blood Thinner Signs or symptoms of abuse/neglect since last visito No Eliquis Hospitalized since last visit: No Pain Present Now: No Electronic Signature(s) Signed: 03/18/2023 4:31:16 PM By: Karie Schwalbe RN Entered By: Karie Schwalbe on 03/18/2023 07:02:17 -------------------------------------------------------------------------------- Compression Therapy Details Patient Name: Date of Service: Richard Alert [name] 03/18/2023 9:30 A M Medical Record Number: [id number] Patient Account Number: [id number] Date of Birth/Sex: Treating RN: Nov 20, 1940 (82 y.o. Dianna Limbo Primary Care Zaley Talley: Seabron Spates Other Clinician: Referring Malai Lady: Treating Zane Pellecchia/Extender: Verner Mould in Treatment: 3 Compression Therapy Performed for Wound Assessment: Wound #13 Right,Proximal,Anterior Lower Leg Performed By: Clinician Karie Schwalbe, RN Compression Type: Three Layer Post Procedure Diagnosis Same as Pre-procedure Electronic  Signature(s) Signed: 03/18/2023 4:31:16 PM By: Karie Schwalbe RN Entered By: Karie Schwalbe on 03/18/2023 07:25:14 -------------------------------------------------------------------------------- Compression Therapy Details Patient Name: Date of Service: Richard [name], [name] 03/18/2023 9:30 A M Medical Record Number: [id number] Patient Account Number: [id number] Richard [name], Richard [name] ([id number]) [id number] Page 2 of 11 Date of Birth/Sex: Treating RN: 1940/07/31 (82 y.o. Dianna Limbo Primary Care Laterica Matarazzo: Other Clinician: Seabron Spates Referring Celenia Hruska: Treating Nehemiah Montee/Extender: Verner Mould in Treatment: 3 Compression Therapy Performed for Wound Assessment: Wound #15 Right,Medial Lower Leg Performed By: Clinician Karie Schwalbe, RN Compression Type: Three Layer Post Procedure Diagnosis Same as Pre-procedure Electronic Signature(s) Signed: 03/18/2023 4:31:16 PM By: Karie Schwalbe RN Entered By: Karie Schwalbe on 03/18/2023 07:25:14 -------------------------------------------------------------------------------- Compression Therapy Details Patient Name: Date of Service: Richard Alert [name] 03/18/2023 9:30 A M Medical Record Number: [id number] Patient Account Number: [id number] Date of Birth/Sex: Treating RN: Jun 16, 1941 (82 y.o. Dianna Limbo Primary Care Ikenna Ohms: Seabron Spates Other Clinician: Referring Dalessandro Baldyga: Treating Gonsalo Cuthbertson/Extender: Verner Mould in Treatment: 3 Compression Therapy Performed for Wound Assessment: Wound #16 Left,Anterior Lower Leg Performed By: Clinician Karie Schwalbe, RN Compression Type: Three Layer Post Procedure Diagnosis Same as Pre-procedure Electronic Signature(s) Signed: 03/18/2023 4:31:16 PM By: Karie Schwalbe RN Entered By: Karie Schwalbe on 03/18/2023  07:25:14 -------------------------------------------------------------------------------- Encounter Discharge Information Details Patient Name: Date of Service: Richard [name], [name] 03/18/2023 9:30 A M Medical Record Number: [id number] Patient Account Number: [id number] Date of Birth/Sex: Treating RN: July 26, 1940 (82 y.o. Dianna Limbo Primary Care Kalum Minner: Seabron Spates Other Clinician: Referring Kesley Mullens: Treating Ankur Snowdon/Extender: Verner Mould in Treatment: 3 Encounter Discharge Information Items Post Procedure Vitals Discharge Condition: Stable Temperature (F): 98.7 Ambulatory Status: Ambulatory Pulse (bpm): 87 Discharge Destination: Home Respiratory Rate (breaths/min): 18 Transportation: Private Auto Blood Pressure (mmHg): 147/88 Accompanied By: self Schedule Follow-up Appointment: Yes Clinical Summary of Care: Patient Declined Electronic Signature(s) Signed: 03/18/2023 4:31:16 PM By: Karie Schwalbe RN Entered By: Karie Schwalbe on  Support Structures Exudate Amount: Medium Exudate Type: Serosanguineous Exudate Color: red, brown Foul Odor After Cleansing: No Slough/Fibrino Yes Wound Bed Granulation Amount: Medium (34-66%) Exposed Structure Granulation Quality: Red Fat Layer (Subcutaneous Tissue) Exposed: Yes Necrotic Amount: Medium (34-66%) Necrotic Quality: Eschar, Adherent Slough Periwound Skin Texture Texture Color No Abnormalities Noted: No No Abnormalities Noted: Yes Scarring: Yes Temperature / Pain Temperature: No Abnormality Moisture No Abnormalities Noted: Yes Treatment Notes Wound #13 (Lower Leg) Wound Laterality: Right, Anterior, Proximal Cleanser Soap and Water Discharge Instruction: May shower and wash wound with dial antibacterial soap and water prior to dressing change. Vashe 5.8  (oz) Discharge Instruction: Cleanse the wound with Vashe prior to applying a clean dressing using gauze sponges, not tissue or cotton balls. Wound Cleanser Discharge Instruction: Cleanse the wound with wound cleanser prior to applying a clean dressing using gauze sponges, not tissue or cotton balls. Peri-Wound Care Topical Primary Dressing Maxorb Extra Ag+ Alginate Dressing, 4x4.75 (in/in) Discharge Instruction: Apply to wound bed as instructed Secondary Dressing Woven Gauze Sponge, Non-Sterile 4x4 in ESIAH, COFFELL ([id number]) [id number] Page 8 of 11 Discharge Instruction: Apply over primary dressing as directed. Secured With Compression Wrap Urgo K2 Lite, (equivalent to a 3 layer) two layer compression system, regular Discharge Instruction: Apply Urgo K2 Lite as directed (alternative to 3 layer compression). Tubular netting #5 Compression Stockings Add-Ons Electronic Signature(s) Signed: 03/18/2023 4:31:16 PM By: Karie Schwalbe RN Entered By: Karie Schwalbe on 03/18/2023 07:07:57 -------------------------------------------------------------------------------- Wound Assessment Details Patient Name: Date of Service: Richard Alert [name] 03/18/2023 9:30 A M Medical Record Number: [id number] Patient Account Number: [id number] Date of Birth/Sex: Treating RN: [birth year] (82 y.o. Dianna Limbo Primary Care Jae Bruck: Seabron Spates Other Clinician: Referring Gildardo Tickner: Treating Anais Koenen/Extender: Verner Mould in Treatment: 3 Wound Status Wound Number: 15 Primary Trauma, Other Etiology: Wound Location: Right, Medial Lower Leg Wound Open Wounding Event: Skin Tear/Laceration Status: Date Acquired: 02/05/2023 Comorbid Cataracts, Anemia, Sleep Apnea, Arrhythmia, Congestive Heart Weeks Of Treatment: 3 History: Failure, Coronary Artery Disease, Hypertension, Type II Diabetes, Clustered Wound: No Osteoarthritis, Neuropathy,  Received Chemotherapy Photos Wound Measurements Length: (cm) 0.5 Width: (cm) 0.5 Depth: (cm) 0.1 Area: (cm) 0.196 Volume: (cm) 0.02 % Reduction in Area: 40.6% % Reduction in Volume: 39.4% Epithelialization: Small (1-33%) Tunneling: No Undermining: No Wound Description Classification: Full Thickness Without Exposed Support Structures Wound Margin: Fibrotic scar, thickened scar Exudate Amount: Medium Exudate Type: Serosanguineous Exudate Color: red, brown Foul Odor After Cleansing: No Slough/Fibrino Yes Wound Bed Granulation Amount: Medium (34-66%) Exposed Structure Granulation Quality: Red Fat Layer (Subcutaneous Tissue) Exposed: Yes Necrotic Amount: Medium (34-66%) Richard [name], Richard [name] ([id number]) [id number] Page 9 of 11 Necrotic Quality: Eschar Periwound Skin Texture Texture Color No Abnormalities Noted: Yes No Abnormalities Noted: Yes Moisture Temperature / Pain No Abnormalities Noted: Yes Temperature: No Abnormality Treatment Notes Wound #15 (Lower Leg) Wound Laterality: Right, Medial Cleanser Soap and Water Discharge Instruction: May shower and wash wound with dial antibacterial soap and water prior to dressing change. Vashe 5.8 (oz) Discharge Instruction: Cleanse the wound with Vashe prior to applying a clean dressing using gauze sponges, not tissue or cotton balls. Wound Cleanser Discharge Instruction: Cleanse the wound with wound cleanser prior to applying a clean dressing using gauze sponges, not tissue or cotton balls. Peri-Wound Care Topical Primary Dressing Maxorb Extra Ag+ Alginate Dressing, 4x4.75 (in/in) Discharge Instruction: Apply to wound bed as instructed Secondary Dressing Woven Gauze Sponge, Non-Sterile 4x4 in Discharge Instruction: Apply over primary dressing as directed. Secured

## 2023-03-19 DIAGNOSIS — I5042 Chronic combined systolic (congestive) and diastolic (congestive) heart failure: Secondary | ICD-10-CM | POA: Diagnosis not present

## 2023-03-19 DIAGNOSIS — L97428 Non-pressure chronic ulcer of left heel and midfoot with other specified severity: Secondary | ICD-10-CM | POA: Diagnosis not present

## 2023-03-19 DIAGNOSIS — I110 Hypertensive heart disease with heart failure: Secondary | ICD-10-CM | POA: Diagnosis not present

## 2023-03-19 DIAGNOSIS — S81811D Laceration without foreign body, right lower leg, subsequent encounter: Secondary | ICD-10-CM | POA: Diagnosis not present

## 2023-03-19 DIAGNOSIS — E11621 Type 2 diabetes mellitus with foot ulcer: Secondary | ICD-10-CM | POA: Diagnosis not present

## 2023-03-19 DIAGNOSIS — D631 Anemia in chronic kidney disease: Secondary | ICD-10-CM | POA: Diagnosis not present

## 2023-03-25 ENCOUNTER — Encounter (HOSPITAL_BASED_OUTPATIENT_CLINIC_OR_DEPARTMENT_OTHER): Payer: Medicare Other | Admitting: General Surgery

## 2023-03-25 ENCOUNTER — Other Ambulatory Visit: Payer: Self-pay | Admitting: Family Medicine

## 2023-03-25 DIAGNOSIS — L97822 Non-pressure chronic ulcer of other part of left lower leg with fat layer exposed: Secondary | ICD-10-CM | POA: Diagnosis not present

## 2023-03-25 DIAGNOSIS — S81802A Unspecified open wound, left lower leg, initial encounter: Secondary | ICD-10-CM | POA: Diagnosis not present

## 2023-03-25 DIAGNOSIS — L97522 Non-pressure chronic ulcer of other part of left foot with fat layer exposed: Secondary | ICD-10-CM | POA: Diagnosis not present

## 2023-03-25 DIAGNOSIS — I130 Hypertensive heart and chronic kidney disease with heart failure and stage 1 through stage 4 chronic kidney disease, or unspecified chronic kidney disease: Secondary | ICD-10-CM | POA: Diagnosis not present

## 2023-03-25 DIAGNOSIS — E11622 Type 2 diabetes mellitus with other skin ulcer: Secondary | ICD-10-CM | POA: Diagnosis not present

## 2023-03-25 DIAGNOSIS — S81801A Unspecified open wound, right lower leg, initial encounter: Secondary | ICD-10-CM | POA: Diagnosis not present

## 2023-03-25 DIAGNOSIS — N1832 Chronic kidney disease, stage 3b: Secondary | ICD-10-CM | POA: Diagnosis not present

## 2023-03-25 DIAGNOSIS — L97812 Non-pressure chronic ulcer of other part of right lower leg with fat layer exposed: Secondary | ICD-10-CM | POA: Diagnosis not present

## 2023-03-25 NOTE — Progress Notes (Signed)
Discharge Instructions: Apply moisturizing lotion as directed Prim Dressing: Hydrofera Blue Ready Transfer Foam, 2.5x2.5 (in/in) 3 x Per Week/30 Days ary Discharge Instructions: Apply directly to wound bed as directed Secondary Dressing: ABD Pad, 8x10 3 x Per Week/30 Days Discharge Instructions: Apply over primary dressing as directed. Secondary Dressing: Woven Gauze Sponge, Non-Sterile 4x4 in 3 x Per Week/30 Days Discharge Instructions: Apply over primary dressing as directed. Secured With: Transpore Surgical T ape, 2x10 (in/yd) 3 x Per Week/30 Days Discharge Instructions: Secure dressing with tape as directed. Com pression Wrap: Urgo K2 Lite, (equivalent to a 3 layer) two layer compression system, regular 3 x Per Week/30 Days Discharge Instructions: Apply Urgo K2 Lite as directed (alternative to 3 layer compression). Com pression Wrap: Netting #5 3 x Per Week/30 Days 03/25/2023: The proximal right leg wound is almost closed with just a very small superficial opening. The wound is clean without any slough or eschar. The more distal of the right lower leg wounds has slough on the surface and some hypertrophic granulation tissue. The left anterior tibial wound is about the same size and also has slough and hypertrophic granulation tissue. Edema control is good. The proximal right leg wound did not require debridement. I debrided slough from the distal right lower leg wound and then chemically cauterized the hypertrophic granulation tissue with silver nitrate. I did the same on the left anterior tibial wound. I would change the contact layer to these wounds to [facility] Blue to try and decrease the  reaccumulation of the hypertrophic granulation tissue. Continue silver alginate to the right proximal leg ulcer. Bilateral Urgo lite wraps. Follow- up in 1 week. Electronic Signature(s) Signed: 03/25/2023 10:29:29 AM By: Duanne Guess MD FACS Entered By: Duanne Guess on 03/25/2023 07:29:29 -------------------------------------------------------------------------------- HxROS Details Patient Name: Date of Service: Richard [name], Richard [name] 03/25/2023 9:30 A M Medical Record Number: [id number] Patient Account Number: [id number] Date of Birth/Sex: Treating RN: 1941-04-11 (82 y.o. M) Primary Care Provider: Seabron Spates Other Clinician: COSTAS, VANNICE ([id number]) [id number] Page 11 of 12 Referring Provider: Treating Provider/Extender: Verner Mould in Treatment: 4 Information Obtained From Patient Eyes Medical History: Positive for: Cataracts - Surgery Hematologic/Lymphatic Medical History: Positive for: Anemia Respiratory Medical History: Positive for: Sleep Apnea Cardiovascular Medical History: Positive for: Arrhythmia; Congestive Heart Failure; Coronary Artery Disease; Hypertension Endocrine Medical History: Positive for: Type II Diabetes Time with diabetes: 21 years Treated with: Oral agents Blood sugar tested every day: Yes Tested : Q am Blood sugar testing results: Breakfast: 202 Musculoskeletal Medical History: Positive for: Osteoarthritis Past Medical History Notes: Spinal Stenosis Neurologic Medical History: Positive for: Neuropathy Oncologic Medical History: Positive for: Received Chemotherapy Negative for: Received Radiation Past Medical History Notes: 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 HBO Extended History Items Eyes: Cataracts Immunizations Pneumococcal Vaccine: Received Pneumococcal Vaccination: Yes Received Pneumococcal Vaccination On or After 60th Birthday:  No Implantable Devices Yes Hospitalization / Surgery History Type of Hospitalization/Surgery cellulitis right leg 10/25-10/28/2023 balloon angioplasty 03/2021 12/29/2021 CHF Family and Social History JESUS, LAPENTA ([id number]) [id number] Page 12 of 12 Cancer: Yes - Mother; Diabetes: Yes - Maternal Grandparents; Heart Disease: Yes - Paternal Grandparents; Hereditary Spherocytosis: No; Hypertension: Yes - Paternal Grandparents; Kidney Disease: No; Lung Disease: No; Seizures: Yes - Child; Stroke: Yes - Father; Thyroid Problems: No; Tuberculosis: No; Never smoker; Marital Status - Divorced; Alcohol Use: Never; Drug Use: No History; Caffeine Use: Rarely; Financial Concerns: No; Food, Clothing or Shelter Needs: No; Support System  Discharge Instructions: Apply moisturizing lotion as directed Prim Dressing: Hydrofera Blue Ready Transfer Foam, 2.5x2.5 (in/in) 3 x Per Week/30 Days ary Discharge Instructions: Apply directly to wound bed as directed Secondary Dressing: ABD Pad, 8x10 3 x Per Week/30 Days Discharge Instructions: Apply over primary dressing as directed. Secondary Dressing: Woven Gauze Sponge, Non-Sterile 4x4 in 3 x Per Week/30 Days Discharge Instructions: Apply over primary dressing as directed. Secured With: Transpore Surgical T ape, 2x10 (in/yd) 3 x Per Week/30 Days Discharge Instructions: Secure dressing with tape as directed. Com pression Wrap: Urgo K2 Lite, (equivalent to a 3 layer) two layer compression system, regular 3 x Per Week/30 Days Discharge Instructions: Apply Urgo K2 Lite as directed (alternative to 3 layer compression). Com pression Wrap: Netting #5 3 x Per Week/30 Days 03/25/2023: The proximal right leg wound is almost closed with just a very small superficial opening. The wound is clean without any slough or eschar. The more distal of the right lower leg wounds has slough on the surface and some hypertrophic granulation tissue. The left anterior tibial wound is about the same size and also has slough and hypertrophic granulation tissue. Edema control is good. The proximal right leg wound did not require debridement. I debrided slough from the distal right lower leg wound and then chemically cauterized the hypertrophic granulation tissue with silver nitrate. I did the same on the left anterior tibial wound. I would change the contact layer to these wounds to [facility] Blue to try and decrease the  reaccumulation of the hypertrophic granulation tissue. Continue silver alginate to the right proximal leg ulcer. Bilateral Urgo lite wraps. Follow- up in 1 week. Electronic Signature(s) Signed: 03/25/2023 10:29:29 AM By: Duanne Guess MD FACS Entered By: Duanne Guess on 03/25/2023 07:29:29 -------------------------------------------------------------------------------- HxROS Details Patient Name: Date of Service: Richard [name], Richard [name] 03/25/2023 9:30 A M Medical Record Number: [id number] Patient Account Number: [id number] Date of Birth/Sex: Treating RN: 1941-04-11 (82 y.o. M) Primary Care Provider: Seabron Spates Other Clinician: COSTAS, VANNICE ([id number]) [id number] Page 11 of 12 Referring Provider: Treating Provider/Extender: Verner Mould in Treatment: 4 Information Obtained From Patient Eyes Medical History: Positive for: Cataracts - Surgery Hematologic/Lymphatic Medical History: Positive for: Anemia Respiratory Medical History: Positive for: Sleep Apnea Cardiovascular Medical History: Positive for: Arrhythmia; Congestive Heart Failure; Coronary Artery Disease; Hypertension Endocrine Medical History: Positive for: Type II Diabetes Time with diabetes: 21 years Treated with: Oral agents Blood sugar tested every day: Yes Tested : Q am Blood sugar testing results: Breakfast: 202 Musculoskeletal Medical History: Positive for: Osteoarthritis Past Medical History Notes: Spinal Stenosis Neurologic Medical History: Positive for: Neuropathy Oncologic Medical History: Positive for: Received Chemotherapy Negative for: Received Radiation Past Medical History Notes: 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 HBO Extended History Items Eyes: Cataracts Immunizations Pneumococcal Vaccine: Received Pneumococcal Vaccination: Yes Received Pneumococcal Vaccination On or After 60th Birthday:  No Implantable Devices Yes Hospitalization / Surgery History Type of Hospitalization/Surgery cellulitis right leg 10/25-10/28/2023 balloon angioplasty 03/2021 12/29/2021 CHF Family and Social History JESUS, LAPENTA ([id number]) [id number] Page 12 of 12 Cancer: Yes - Mother; Diabetes: Yes - Maternal Grandparents; Heart Disease: Yes - Paternal Grandparents; Hereditary Spherocytosis: No; Hypertension: Yes - Paternal Grandparents; Kidney Disease: No; Lung Disease: No; Seizures: Yes - Child; Stroke: Yes - Father; Thyroid Problems: No; Tuberculosis: No; Never smoker; Marital Status - Divorced; Alcohol Use: Never; Drug Use: No History; Caffeine Use: Rarely; Financial Concerns: No; Food, Clothing or Shelter Needs: No; Support System  Generic) 1 x Per Week/30 Days Discharge Instructions: Apply over primary dressing as directed. Compression Wrap: Urgo K2 Lite, (equivalent to a 3 layer) two layer compression system, regular (Generic) 1 x Per Week/30 Days Discharge Instructions: Apply Urgo K2 Lite as directed (alternative to 3 layer compression). Compression Wrap: Tubular netting #5 1 x Per Week/30 Days Wound #15 - Lower Leg Wound Laterality: Right, Medial Cleanser: Soap and Water 1 x Per Week/30 Days Discharge Instructions: May shower and wash wound with dial antibacterial soap and water prior to dressing change. Cleanser: Vashe 5.8 (oz) 1 x Per Week/30 Days Discharge Instructions: Cleanse the wound with Vashe prior to applying a clean dressing using gauze sponges, not tissue or cotton balls. Cleanser: Wound Cleanser 1 x Per Week/30 Days Discharge Instructions: Cleanse the wound with wound cleanser prior to applying a clean dressing using gauze sponges, not tissue or cotton balls. Prim Dressing: Hydrofera Blue Ready Transfer Foam, 2.5x2.5 (in/in) 1 x Per Week/30 Days ary Discharge Instructions: Apply directly to wound bed as directed Secondary Dressing: Woven Gauze Sponge, Non-Sterile 4x4 in (Generic) 1 x Per Week/30 Days Discharge Instructions: Apply over primary dressing as directed. Compression Wrap: Urgo K2 Lite, (equivalent to a 3 layer) two layer compression system, regular (Generic) 1 x Per Week/30 Days Discharge Instructions: Apply Urgo K2 Lite as directed (alternative to 3 layer compression). Compression  Wrap: Tubular netting #5 (Generic) 1 x Per Week/30 Days Wound #16 - Lower Leg Wound Laterality: Left, Anterior Peri-Wound Care: Sween Lotion (Moisturizing lotion) 3 x Per Week/30 Days Discharge Instructions: Apply moisturizing lotion as directed Prim Dressing: Hydrofera Blue Ready Transfer Foam, 2.5x2.5 (in/in) 3 x Per Week/30 Days ary Discharge Instructions: Apply directly to wound bed as directed Secondary Dressing: ABD Pad, 8x10 3 x Per Week/30 Days Discharge Instructions: Apply over primary dressing as directed. Secondary Dressing: Woven Gauze Sponge, Non-Sterile 4x4 in 3 x Per Week/30 Days Discharge Instructions: Apply over primary dressing as directed. Secured With: Transpore Surgical Tape, 2x10 (in/yd) 3 x Per Week/30 Days Discharge Instructions: Secure dressing with tape as directed. RAYYAN, MELLGREN ([id number]) [id number] Page 6 of 12 Compression Wrap: Urgo K2 Lite, (equivalent to a 3 layer) two layer compression system, regular 3 x Per Week/30 Days Discharge Instructions: Apply Urgo K2 Lite as directed (alternative to 3 layer compression). Compression Wrap: Netting #5 3 x Per Week/30 Days Electronic Signature(s) Signed: 03/25/2023 10:42:46 AM By: Duanne Guess MD FACS Entered By: Duanne Guess on 03/25/2023 07:28:31 -------------------------------------------------------------------------------- Problem List Details Patient Name: Date of Service: Richard [name], [name] 03/25/2023 9:30 A M Medical Record Number: [id number] Patient Account Number: [id number] Date of Birth/Sex: Treating RN: Feb 03, 1941 (82 y.o. M) Primary Care Provider: Seabron Spates Other Clinician: Referring Provider: Treating Provider/Extender: Verner Mould in Treatment: 4 Active Problems ICD-10 Encounter Code Description Active Date MDM Diagnosis [phone] Non-pressure chronic ulcer of other part of right lower leg with fat layer 02/25/2023 No  Yes exposed L97.822 Non-pressure chronic ulcer of other part of left lower leg with fat layer exposed9/04/2023 No Yes E11.622 Type 2 diabetes mellitus with other skin ulcer 02/25/2023 No Yes I50.42 Chronic combined systolic (congestive) and diastolic (congestive) heart failure 02/25/2023 No Yes N18.32 Chronic kidney disease, stage 3b 02/25/2023 No Yes R60.0 Localized edema 02/25/2023 No Yes Inactive Problems ICD-10 Code Description Active Date Inactive Date E11.621 Type 2 diabetes mellitus with foot ulcer 02/25/2023 02/25/2023 Resolved Problems ICD-10 Code Description Active Date Resolved Date L97.522 Non-pressure chronic ulcer of other part of  granulation tissue. Edema control is good. Electronic Signature(s) Signed: 03/25/2023 10:26:14 AM By: Duanne Guess MD FACS Entered By: Duanne Guess on 03/25/2023 07:26:14 -------------------------------------------------------------------------------- Physical Exam Details Patient Name: Date of Service: Richard Alert [name] 03/25/2023 9:30 A M Medical Record Number: [id number] Patient Account Number: [id number] Date of Birth/Sex: Treating RN: October 13, 1940 (82 y.o. M) Primary Care Provider: Seabron Spates Other Clinician: Referring Provider: Treating Provider/Extender: Verner Mould in Treatment: 4 Constitutional . . . . no acute distress. Respiratory Normal work of breathing on room air. Notes 03/25/2023: The proximal right leg wound is almost closed with just a very small superficial opening. The wound is clean without any slough or eschar. The more distal of the right lower leg wounds has slough on the surface and some hypertrophic granulation tissue. The left anterior tibial wound is about the same size and also has slough and hypertrophic granulation tissue. Edema control is good. Electronic Signature(s) Signed: 03/25/2023 10:26:55 AM By: Duanne Guess MD FACS Entered By: Duanne Guess on 03/25/2023 07:26:55 -------------------------------------------------------------------------------- Physician Orders Details Patient Name: Date of Service: Richard [name], [name] 03/25/2023 9:30 A M Medical Record Number: [id number] Patient Account Number: [id number] Date of Birth/Sex: Treating RN: [birth year] (82 y.o. Dianna Limbo Primary Care Provider: Seabron Spates Other Clinician: Referring Provider: Treating Provider/Extender: Verner Mould in Treatment: 4 Verbal / Phone Orders: No Diagnosis Coding ICD-10 Coding Code Description [phone] Non-pressure chronic ulcer of other part of right lower leg with fat layer exposed L97.822 Non-pressure chronic ulcer of other part of left lower leg with fat layer exposed E11.622 Type 2 diabetes mellitus with other skin ulcer I50.42 Chronic combined systolic (congestive) and diastolic (congestive) heart failure N18.32 Chronic kidney disease, stage 3b R60.0 Localized edema Follow-up Appointments ppointment in 1 week. - Dr. Lady Gary 03/31/23 at 10:15am Return A ppointment in 2 weeks. - Please ask front desk to schedule an appointment Return A Return appointment in 3 weeks. - Please ask front desk to schedule an appointment MANAS, MCGUGAN ([id number]) [id number] Page 5 of 12 Return appointment in 1 month. - Please ask front desk to schedule an appointment Anesthetic (In clinic) Topical Lidocaine 5% applied to wound bed Bathing/ Shower/ Hygiene May shower and wash wound with soap and water. - Avoid getting moisture on wounds-the wraps on the Right leg Edema Control - Lymphedema / SCD / Other Bilateral Lower Extremities Elevate legs to the level of the heart or above for 30 minutes daily and/or when sitting for 3-4 times a day throughout the day. Avoid standing for long periods of time. Exercise regularly - As T olerated Moisturize legs daily. Home Health Discontinue home health for wound care. - Discontinue Home health -[facility] Wound Treatment Wound #13 - Lower Leg Wound Laterality: Right, Anterior, Proximal Cleanser: Soap and Water 1 x Per Week/30 Days Discharge Instructions: May shower and wash wound with dial antibacterial soap and water prior to dressing change. Cleanser: Vashe 5.8 (oz) 1 x Per Week/30 Days Discharge Instructions: Cleanse the wound with Vashe prior to applying a clean dressing using gauze sponges, not tissue or cotton  balls. Cleanser: Wound Cleanser 1 x Per Week/30 Days Discharge Instructions: Cleanse the wound with wound cleanser prior to applying a clean dressing using gauze sponges, not tissue or cotton balls. Prim Dressing: Maxorb Extra Ag+ Alginate Dressing, 4x4.75 (in/in) (Generic) 1 x Per Week/30 Days ary Discharge Instructions: Apply to wound bed as instructed Secondary Dressing: Woven Gauze Sponge, Non-Sterile 4x4 in (  BERKELEY, GOCHNOUR ([id number]) [id number] Page 1 of 12 Visit Report for 03/25/2023 Chief Complaint Document Details Patient Name: Date of Service: Richard Alert [name] 03/25/2023 9:30 A M Medical Record Number: [id number] Patient Account Number: [id number] Date of Birth/Sex: Treating RN: 08-16-1940 (82 y.o. M) Primary Care Provider: Seabron Spates Other Clinician: Referring Provider: Treating Provider/Extender: Verner Mould in Treatment: 4 Information Obtained from: Patient Chief Complaint 01/12/2021; Wounds to his left upper extremity and bilateral lower extremities. 05/14/2022; bilateral lower extremity wounds, right plantar foot wound 09/05/2022; bilateral lower extremity wounds, left thumb wound 02/25/2023: left foot DFU and right knee and RLE wounds Electronic Signature(s) Signed: 03/25/2023 10:25:23 AM By: Duanne Guess MD FACS Entered By: Duanne Guess on 03/25/2023 07:25:23 -------------------------------------------------------------------------------- Debridement Details Patient Name: Date of Service: Richard [name], [name] 03/25/2023 9:30 A M Medical Record Number: [id number] Patient Account Number: [id number] Date of Birth/Sex: Treating RN: 1940-07-10 (82 y.o. Dianna Limbo Primary Care Provider: Seabron Spates Other Clinician: Referring Provider: Treating Provider/Extender: Verner Mould in Treatment: 4 Debridement Performed for Assessment: Wound #15 Right,Medial Lower Leg Performed By: Physician Duanne Guess, MD The following information was scribed by: Karie Schwalbe The information was scribed for: Duanne Guess Debridement Type: Debridement Level of Consciousness (Pre-procedure): Awake and Alert Pre-procedure Verification/Time Out Yes - 09:50 Taken: Start Time: 09:50 Pain Control: Lidocaine 4% T opical Solution Percent of Wound Bed Debrided: 100% T Area  Debrided (cm): otal 0.2 Tissue and other material debrided: Non-Viable, Eschar, Slough, Slough Level: Non-Viable Tissue Debridement Description: Selective/Open Wound Instrument: Curette Bleeding: Minimum Hemostasis Achieved: Silver Nitrate End Time: 09:52 Procedural Pain: 0 Post Procedural Pain: 0 Response to Treatment: Procedure was tolerated well Level of Consciousness (Post- Awake and Alert procedure): Post Debridement Measurements of Total Wound Length: (cm) 0.5 HERRON, LIPPI ([id number]) [id number] Page 2 of 12 Width: (cm) 0.5 Depth: (cm) 0.1 Volume: (cm) 0.02 Character of Wound/Ulcer Post Debridement: Improved Post Procedure Diagnosis Same as Pre-procedure Electronic Signature(s) Signed: 03/25/2023 10:29:38 AM By: Karie Schwalbe RN Signed: 03/25/2023 10:42:46 AM By: Duanne Guess MD FACS Entered By: Karie Schwalbe on 03/25/2023 06:56:07 -------------------------------------------------------------------------------- Debridement Details Patient Name: Date of Service: Richard [name], [name] 03/25/2023 9:30 A M Medical Record Number: [id number] Patient Account Number: [id number] Date of Birth/Sex: Treating RN: 1941/02/07 (82 y.o. Dianna Limbo Primary Care Provider: Seabron Spates Other Clinician: Referring Provider: Treating Provider/Extender: Verner Mould in Treatment: 4 Debridement Performed for Assessment: Wound #16 Left,Anterior Lower Leg Performed By: Physician Duanne Guess, MD The following information was scribed by: Karie Schwalbe The information was scribed for: Duanne Guess Debridement Type: Debridement Level of Consciousness (Pre-procedure): Awake and Alert Pre-procedure Verification/Time Out Yes - 09:50 Taken: Start Time: 09:50 Pain Control: Lidocaine 4% T opical Solution Percent of Wound Bed Debrided: 100% T Area Debrided (cm): otal 2.12 Tissue and other material debrided:  Non-Viable, Eschar, Slough, Slough Level: Non-Viable Tissue Debridement Description: Selective/Open Wound Instrument: Curette Bleeding: Minimum Hemostasis Achieved: Silver Nitrate End Time: 09:52 Procedural Pain: 0 Post Procedural Pain: 0 Response to Treatment: Procedure was tolerated well Level of Consciousness (Post- Awake and Alert procedure): Post Debridement Measurements of Total Wound Length: (cm) 1.8 Width: (cm) 1.5 Depth: (cm) 0.1 Volume: (cm) 0.212 Character of Wound/Ulcer Post Debridement: Improved Post Procedure Diagnosis Same as Pre-procedure Electronic Signature(s) Signed: 03/25/2023 10:29:38 AM By: Karie Schwalbe RN Signed: 03/25/2023 10:42:46 AM By: Duanne Guess MD FACS Entered By: Karie Schwalbe on 03/25/2023 07:00:36 Lajoyce Lauber  left foot with fat layer exposed 02/25/2023 02/25/2023 DREDON, BARRETT ([id number]) [id number] Page 7 of 12 Electronic Signature(s) Signed: 03/25/2023 10:25:03 AM By: Duanne Guess MD FACS Entered By: Duanne Guess on 03/25/2023 07:25:03 -------------------------------------------------------------------------------- Progress Note Details Patient Name: Date of Service: Richard [name], [name] 03/25/2023 9:30 A M Medical Record Number: [id number] Patient Account Number: [id number] Date of Birth/Sex: Treating RN: 11/25/1940 (82 y.o. M) Primary Care Provider: Seabron Spates Other Clinician: Referring Provider: Treating Provider/Extender: Verner Mould in Treatment: 4 Subjective Chief Complaint Information obtained from Patient 01/12/2021; Wounds to his left upper extremity and bilateral lower extremities. 05/14/2022; bilateral lower extremity wounds, right plantar foot wound 09/05/2022; bilateral lower extremity wounds, left thumb wound 02/25/2023: left foot DFU and right knee and RLE wounds History of Present Illness (HPI) Admission 7/15 Mr. Regionald Mahlberg is an 82 year old male with a past medical history of type 2 diabetes on oral agents, diffuse large B-cell lymphoma, and coronary  artery disease status post CABG that presents to the clinic for wounds to his left upper extremity and bilateral lower extremities. He states that he went to urgent care to be evaluated for a fever. And he was instructed to go to the ED to be further evaluated for possible sepsis. Unfortunately he passed out in his car After his appointment and was not discovered for another 3 hours. He was eventually discovered and EMS had to take him out of his vehicle. This is when he developed abrasions to his left arm and legs. He has been using Xeroform to his wound beds every other day. He reports improvement to all the wounds except for the one on the back of his right leg. He denies pain or signs of infection. 7/22; patient presents for 1 week follow-up. He has no issues or complaints today. He has tolerated the compression wrap well on his right lower extremity. He has been able to do the dressing changes without issues to his left leg and left arm. His left arm wound has healed. He denies signs of infection. 7/29; patient presents for 1 week follow-up. He reports that his left leg wound is healed. He tolerated the compression wrap well on his right leg. He has no issues or complaints today. He denies signs of infection. 8/4; patient presents for 1 week follow-up. He continues to see improvement in wound healing to his right lower extremity. He has no issues or complaints today. He denies signs of infection. He does state that he went to the dermatologist and had some lesions removed on his face and left leg. He has been keeping these covered with a Band-Aid. 8/19; patient presents for follow-up. He is tolerated the compression wrap well. He has no issues or complaints today. He now only has 1 remaining wound. 9/1; patient presents for 1 week follow-up. He has tolerated the compression wrap well and has no issues or complaints today. He has been golfing 3 times weekly. He is in good spirits today. 9/15;  patient presents for follow-up. He has no issues or complaints today. The wound is closed. Readmission 05/14/2022 Mr. Leverne Leisy is an 82 year old male with a past medical history of uncontrolled type 2 diabetes on oral agents, diffuse large B-cell lymphoma and coronary artery disease status post CABG that presents the clinic for 3 wounds. He developed a right knee wound when he fell out of his golf cart 3 weeks ago. He developed increased warmth and erythema to the right leg. While he was  Generic) 1 x Per Week/30 Days Discharge Instructions: Apply over primary dressing as directed. Compression Wrap: Urgo K2 Lite, (equivalent to a 3 layer) two layer compression system, regular (Generic) 1 x Per Week/30 Days Discharge Instructions: Apply Urgo K2 Lite as directed (alternative to 3 layer compression). Compression Wrap: Tubular netting #5 1 x Per Week/30 Days Wound #15 - Lower Leg Wound Laterality: Right, Medial Cleanser: Soap and Water 1 x Per Week/30 Days Discharge Instructions: May shower and wash wound with dial antibacterial soap and water prior to dressing change. Cleanser: Vashe 5.8 (oz) 1 x Per Week/30 Days Discharge Instructions: Cleanse the wound with Vashe prior to applying a clean dressing using gauze sponges, not tissue or cotton balls. Cleanser: Wound Cleanser 1 x Per Week/30 Days Discharge Instructions: Cleanse the wound with wound cleanser prior to applying a clean dressing using gauze sponges, not tissue or cotton balls. Prim Dressing: Hydrofera Blue Ready Transfer Foam, 2.5x2.5 (in/in) 1 x Per Week/30 Days ary Discharge Instructions: Apply directly to wound bed as directed Secondary Dressing: Woven Gauze Sponge, Non-Sterile 4x4 in (Generic) 1 x Per Week/30 Days Discharge Instructions: Apply over primary dressing as directed. Compression Wrap: Urgo K2 Lite, (equivalent to a 3 layer) two layer compression system, regular (Generic) 1 x Per Week/30 Days Discharge Instructions: Apply Urgo K2 Lite as directed (alternative to 3 layer compression). Compression  Wrap: Tubular netting #5 (Generic) 1 x Per Week/30 Days Wound #16 - Lower Leg Wound Laterality: Left, Anterior Peri-Wound Care: Sween Lotion (Moisturizing lotion) 3 x Per Week/30 Days Discharge Instructions: Apply moisturizing lotion as directed Prim Dressing: Hydrofera Blue Ready Transfer Foam, 2.5x2.5 (in/in) 3 x Per Week/30 Days ary Discharge Instructions: Apply directly to wound bed as directed Secondary Dressing: ABD Pad, 8x10 3 x Per Week/30 Days Discharge Instructions: Apply over primary dressing as directed. Secondary Dressing: Woven Gauze Sponge, Non-Sterile 4x4 in 3 x Per Week/30 Days Discharge Instructions: Apply over primary dressing as directed. Secured With: Transpore Surgical Tape, 2x10 (in/yd) 3 x Per Week/30 Days Discharge Instructions: Secure dressing with tape as directed. RAYYAN, MELLGREN ([id number]) [id number] Page 6 of 12 Compression Wrap: Urgo K2 Lite, (equivalent to a 3 layer) two layer compression system, regular 3 x Per Week/30 Days Discharge Instructions: Apply Urgo K2 Lite as directed (alternative to 3 layer compression). Compression Wrap: Netting #5 3 x Per Week/30 Days Electronic Signature(s) Signed: 03/25/2023 10:42:46 AM By: Duanne Guess MD FACS Entered By: Duanne Guess on 03/25/2023 07:28:31 -------------------------------------------------------------------------------- Problem List Details Patient Name: Date of Service: Richard [name], [name] 03/25/2023 9:30 A M Medical Record Number: [id number] Patient Account Number: [id number] Date of Birth/Sex: Treating RN: Feb 03, 1941 (82 y.o. M) Primary Care Provider: Seabron Spates Other Clinician: Referring Provider: Treating Provider/Extender: Verner Mould in Treatment: 4 Active Problems ICD-10 Encounter Code Description Active Date MDM Diagnosis [phone] Non-pressure chronic ulcer of other part of right lower leg with fat layer 02/25/2023 No  Yes exposed L97.822 Non-pressure chronic ulcer of other part of left lower leg with fat layer exposed9/04/2023 No Yes E11.622 Type 2 diabetes mellitus with other skin ulcer 02/25/2023 No Yes I50.42 Chronic combined systolic (congestive) and diastolic (congestive) heart failure 02/25/2023 No Yes N18.32 Chronic kidney disease, stage 3b 02/25/2023 No Yes R60.0 Localized edema 02/25/2023 No Yes Inactive Problems ICD-10 Code Description Active Date Inactive Date E11.621 Type 2 diabetes mellitus with foot ulcer 02/25/2023 02/25/2023 Resolved Problems ICD-10 Code Description Active Date Resolved Date L97.522 Non-pressure chronic ulcer of other part of  left foot with fat layer exposed 02/25/2023 02/25/2023 DREDON, BARRETT ([id number]) [id number] Page 7 of 12 Electronic Signature(s) Signed: 03/25/2023 10:25:03 AM By: Duanne Guess MD FACS Entered By: Duanne Guess on 03/25/2023 07:25:03 -------------------------------------------------------------------------------- Progress Note Details Patient Name: Date of Service: Richard [name], [name] 03/25/2023 9:30 A M Medical Record Number: [id number] Patient Account Number: [id number] Date of Birth/Sex: Treating RN: 11/25/1940 (82 y.o. M) Primary Care Provider: Seabron Spates Other Clinician: Referring Provider: Treating Provider/Extender: Verner Mould in Treatment: 4 Subjective Chief Complaint Information obtained from Patient 01/12/2021; Wounds to his left upper extremity and bilateral lower extremities. 05/14/2022; bilateral lower extremity wounds, right plantar foot wound 09/05/2022; bilateral lower extremity wounds, left thumb wound 02/25/2023: left foot DFU and right knee and RLE wounds History of Present Illness (HPI) Admission 7/15 Mr. Regionald Mahlberg is an 82 year old male with a past medical history of type 2 diabetes on oral agents, diffuse large B-cell lymphoma, and coronary  artery disease status post CABG that presents to the clinic for wounds to his left upper extremity and bilateral lower extremities. He states that he went to urgent care to be evaluated for a fever. And he was instructed to go to the ED to be further evaluated for possible sepsis. Unfortunately he passed out in his car After his appointment and was not discovered for another 3 hours. He was eventually discovered and EMS had to take him out of his vehicle. This is when he developed abrasions to his left arm and legs. He has been using Xeroform to his wound beds every other day. He reports improvement to all the wounds except for the one on the back of his right leg. He denies pain or signs of infection. 7/22; patient presents for 1 week follow-up. He has no issues or complaints today. He has tolerated the compression wrap well on his right lower extremity. He has been able to do the dressing changes without issues to his left leg and left arm. His left arm wound has healed. He denies signs of infection. 7/29; patient presents for 1 week follow-up. He reports that his left leg wound is healed. He tolerated the compression wrap well on his right leg. He has no issues or complaints today. He denies signs of infection. 8/4; patient presents for 1 week follow-up. He continues to see improvement in wound healing to his right lower extremity. He has no issues or complaints today. He denies signs of infection. He does state that he went to the dermatologist and had some lesions removed on his face and left leg. He has been keeping these covered with a Band-Aid. 8/19; patient presents for follow-up. He is tolerated the compression wrap well. He has no issues or complaints today. He now only has 1 remaining wound. 9/1; patient presents for 1 week follow-up. He has tolerated the compression wrap well and has no issues or complaints today. He has been golfing 3 times weekly. He is in good spirits today. 9/15;  patient presents for follow-up. He has no issues or complaints today. The wound is closed. Readmission 05/14/2022 Mr. Leverne Leisy is an 82 year old male with a past medical history of uncontrolled type 2 diabetes on oral agents, diffuse large B-cell lymphoma and coronary artery disease status post CABG that presents the clinic for 3 wounds. He developed a right knee wound when he fell out of his golf cart 3 weeks ago. He developed increased warmth and erythema to the right leg. While he was  BERKELEY, GOCHNOUR ([id number]) [id number] Page 1 of 12 Visit Report for 03/25/2023 Chief Complaint Document Details Patient Name: Date of Service: Richard Alert [name] 03/25/2023 9:30 A M Medical Record Number: [id number] Patient Account Number: [id number] Date of Birth/Sex: Treating RN: 08-16-1940 (82 y.o. M) Primary Care Provider: Seabron Spates Other Clinician: Referring Provider: Treating Provider/Extender: Verner Mould in Treatment: 4 Information Obtained from: Patient Chief Complaint 01/12/2021; Wounds to his left upper extremity and bilateral lower extremities. 05/14/2022; bilateral lower extremity wounds, right plantar foot wound 09/05/2022; bilateral lower extremity wounds, left thumb wound 02/25/2023: left foot DFU and right knee and RLE wounds Electronic Signature(s) Signed: 03/25/2023 10:25:23 AM By: Duanne Guess MD FACS Entered By: Duanne Guess on 03/25/2023 07:25:23 -------------------------------------------------------------------------------- Debridement Details Patient Name: Date of Service: Richard [name], [name] 03/25/2023 9:30 A M Medical Record Number: [id number] Patient Account Number: [id number] Date of Birth/Sex: Treating RN: 1940-07-10 (82 y.o. Dianna Limbo Primary Care Provider: Seabron Spates Other Clinician: Referring Provider: Treating Provider/Extender: Verner Mould in Treatment: 4 Debridement Performed for Assessment: Wound #15 Right,Medial Lower Leg Performed By: Physician Duanne Guess, MD The following information was scribed by: Karie Schwalbe The information was scribed for: Duanne Guess Debridement Type: Debridement Level of Consciousness (Pre-procedure): Awake and Alert Pre-procedure Verification/Time Out Yes - 09:50 Taken: Start Time: 09:50 Pain Control: Lidocaine 4% T opical Solution Percent of Wound Bed Debrided: 100% T Area  Debrided (cm): otal 0.2 Tissue and other material debrided: Non-Viable, Eschar, Slough, Slough Level: Non-Viable Tissue Debridement Description: Selective/Open Wound Instrument: Curette Bleeding: Minimum Hemostasis Achieved: Silver Nitrate End Time: 09:52 Procedural Pain: 0 Post Procedural Pain: 0 Response to Treatment: Procedure was tolerated well Level of Consciousness (Post- Awake and Alert procedure): Post Debridement Measurements of Total Wound Length: (cm) 0.5 HERRON, LIPPI ([id number]) [id number] Page 2 of 12 Width: (cm) 0.5 Depth: (cm) 0.1 Volume: (cm) 0.02 Character of Wound/Ulcer Post Debridement: Improved Post Procedure Diagnosis Same as Pre-procedure Electronic Signature(s) Signed: 03/25/2023 10:29:38 AM By: Karie Schwalbe RN Signed: 03/25/2023 10:42:46 AM By: Duanne Guess MD FACS Entered By: Karie Schwalbe on 03/25/2023 06:56:07 -------------------------------------------------------------------------------- Debridement Details Patient Name: Date of Service: Richard [name], [name] 03/25/2023 9:30 A M Medical Record Number: [id number] Patient Account Number: [id number] Date of Birth/Sex: Treating RN: 1941/02/07 (82 y.o. Dianna Limbo Primary Care Provider: Seabron Spates Other Clinician: Referring Provider: Treating Provider/Extender: Verner Mould in Treatment: 4 Debridement Performed for Assessment: Wound #16 Left,Anterior Lower Leg Performed By: Physician Duanne Guess, MD The following information was scribed by: Karie Schwalbe The information was scribed for: Duanne Guess Debridement Type: Debridement Level of Consciousness (Pre-procedure): Awake and Alert Pre-procedure Verification/Time Out Yes - 09:50 Taken: Start Time: 09:50 Pain Control: Lidocaine 4% T opical Solution Percent of Wound Bed Debrided: 100% T Area Debrided (cm): otal 2.12 Tissue and other material debrided:  Non-Viable, Eschar, Slough, Slough Level: Non-Viable Tissue Debridement Description: Selective/Open Wound Instrument: Curette Bleeding: Minimum Hemostasis Achieved: Silver Nitrate End Time: 09:52 Procedural Pain: 0 Post Procedural Pain: 0 Response to Treatment: Procedure was tolerated well Level of Consciousness (Post- Awake and Alert procedure): Post Debridement Measurements of Total Wound Length: (cm) 1.8 Width: (cm) 1.5 Depth: (cm) 0.1 Volume: (cm) 0.212 Character of Wound/Ulcer Post Debridement: Improved Post Procedure Diagnosis Same as Pre-procedure Electronic Signature(s) Signed: 03/25/2023 10:29:38 AM By: Karie Schwalbe RN Signed: 03/25/2023 10:42:46 AM By: Duanne Guess MD FACS Entered By: Karie Schwalbe on 03/25/2023 07:00:36 Lajoyce Lauber  Discharge Instructions: Apply moisturizing lotion as directed Prim Dressing: Hydrofera Blue Ready Transfer Foam, 2.5x2.5 (in/in) 3 x Per Week/30 Days ary Discharge Instructions: Apply directly to wound bed as directed Secondary Dressing: ABD Pad, 8x10 3 x Per Week/30 Days Discharge Instructions: Apply over primary dressing as directed. Secondary Dressing: Woven Gauze Sponge, Non-Sterile 4x4 in 3 x Per Week/30 Days Discharge Instructions: Apply over primary dressing as directed. Secured With: Transpore Surgical T ape, 2x10 (in/yd) 3 x Per Week/30 Days Discharge Instructions: Secure dressing with tape as directed. Com pression Wrap: Urgo K2 Lite, (equivalent to a 3 layer) two layer compression system, regular 3 x Per Week/30 Days Discharge Instructions: Apply Urgo K2 Lite as directed (alternative to 3 layer compression). Com pression Wrap: Netting #5 3 x Per Week/30 Days 03/25/2023: The proximal right leg wound is almost closed with just a very small superficial opening. The wound is clean without any slough or eschar. The more distal of the right lower leg wounds has slough on the surface and some hypertrophic granulation tissue. The left anterior tibial wound is about the same size and also has slough and hypertrophic granulation tissue. Edema control is good. The proximal right leg wound did not require debridement. I debrided slough from the distal right lower leg wound and then chemically cauterized the hypertrophic granulation tissue with silver nitrate. I did the same on the left anterior tibial wound. I would change the contact layer to these wounds to [facility] Blue to try and decrease the  reaccumulation of the hypertrophic granulation tissue. Continue silver alginate to the right proximal leg ulcer. Bilateral Urgo lite wraps. Follow- up in 1 week. Electronic Signature(s) Signed: 03/25/2023 10:29:29 AM By: Duanne Guess MD FACS Entered By: Duanne Guess on 03/25/2023 07:29:29 -------------------------------------------------------------------------------- HxROS Details Patient Name: Date of Service: Richard [name], Richard [name] 03/25/2023 9:30 A M Medical Record Number: [id number] Patient Account Number: [id number] Date of Birth/Sex: Treating RN: 1941-04-11 (82 y.o. M) Primary Care Provider: Seabron Spates Other Clinician: COSTAS, VANNICE ([id number]) [id number] Page 11 of 12 Referring Provider: Treating Provider/Extender: Verner Mould in Treatment: 4 Information Obtained From Patient Eyes Medical History: Positive for: Cataracts - Surgery Hematologic/Lymphatic Medical History: Positive for: Anemia Respiratory Medical History: Positive for: Sleep Apnea Cardiovascular Medical History: Positive for: Arrhythmia; Congestive Heart Failure; Coronary Artery Disease; Hypertension Endocrine Medical History: Positive for: Type II Diabetes Time with diabetes: 21 years Treated with: Oral agents Blood sugar tested every day: Yes Tested : Q am Blood sugar testing results: Breakfast: 202 Musculoskeletal Medical History: Positive for: Osteoarthritis Past Medical History Notes: Spinal Stenosis Neurologic Medical History: Positive for: Neuropathy Oncologic Medical History: Positive for: Received Chemotherapy Negative for: Received Radiation Past Medical History Notes: 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 HBO Extended History Items Eyes: Cataracts Immunizations Pneumococcal Vaccine: Received Pneumococcal Vaccination: Yes Received Pneumococcal Vaccination On or After 60th Birthday:  No Implantable Devices Yes Hospitalization / Surgery History Type of Hospitalization/Surgery cellulitis right leg 10/25-10/28/2023 balloon angioplasty 03/2021 12/29/2021 CHF Family and Social History JESUS, LAPENTA ([id number]) [id number] Page 12 of 12 Cancer: Yes - Mother; Diabetes: Yes - Maternal Grandparents; Heart Disease: Yes - Paternal Grandparents; Hereditary Spherocytosis: No; Hypertension: Yes - Paternal Grandparents; Kidney Disease: No; Lung Disease: No; Seizures: Yes - Child; Stroke: Yes - Father; Thyroid Problems: No; Tuberculosis: No; Never smoker; Marital Status - Divorced; Alcohol Use: Never; Drug Use: No History; Caffeine Use: Rarely; Financial Concerns: No; Food, Clothing or Shelter Needs: No; Support System  Discharge Instructions: Apply moisturizing lotion as directed Prim Dressing: Hydrofera Blue Ready Transfer Foam, 2.5x2.5 (in/in) 3 x Per Week/30 Days ary Discharge Instructions: Apply directly to wound bed as directed Secondary Dressing: ABD Pad, 8x10 3 x Per Week/30 Days Discharge Instructions: Apply over primary dressing as directed. Secondary Dressing: Woven Gauze Sponge, Non-Sterile 4x4 in 3 x Per Week/30 Days Discharge Instructions: Apply over primary dressing as directed. Secured With: Transpore Surgical T ape, 2x10 (in/yd) 3 x Per Week/30 Days Discharge Instructions: Secure dressing with tape as directed. Com pression Wrap: Urgo K2 Lite, (equivalent to a 3 layer) two layer compression system, regular 3 x Per Week/30 Days Discharge Instructions: Apply Urgo K2 Lite as directed (alternative to 3 layer compression). Com pression Wrap: Netting #5 3 x Per Week/30 Days 03/25/2023: The proximal right leg wound is almost closed with just a very small superficial opening. The wound is clean without any slough or eschar. The more distal of the right lower leg wounds has slough on the surface and some hypertrophic granulation tissue. The left anterior tibial wound is about the same size and also has slough and hypertrophic granulation tissue. Edema control is good. The proximal right leg wound did not require debridement. I debrided slough from the distal right lower leg wound and then chemically cauterized the hypertrophic granulation tissue with silver nitrate. I did the same on the left anterior tibial wound. I would change the contact layer to these wounds to [facility] Blue to try and decrease the  reaccumulation of the hypertrophic granulation tissue. Continue silver alginate to the right proximal leg ulcer. Bilateral Urgo lite wraps. Follow- up in 1 week. Electronic Signature(s) Signed: 03/25/2023 10:29:29 AM By: Duanne Guess MD FACS Entered By: Duanne Guess on 03/25/2023 07:29:29 -------------------------------------------------------------------------------- HxROS Details Patient Name: Date of Service: Richard [name], Richard [name] 03/25/2023 9:30 A M Medical Record Number: [id number] Patient Account Number: [id number] Date of Birth/Sex: Treating RN: 1941-04-11 (82 y.o. M) Primary Care Provider: Seabron Spates Other Clinician: COSTAS, VANNICE ([id number]) [id number] Page 11 of 12 Referring Provider: Treating Provider/Extender: Verner Mould in Treatment: 4 Information Obtained From Patient Eyes Medical History: Positive for: Cataracts - Surgery Hematologic/Lymphatic Medical History: Positive for: Anemia Respiratory Medical History: Positive for: Sleep Apnea Cardiovascular Medical History: Positive for: Arrhythmia; Congestive Heart Failure; Coronary Artery Disease; Hypertension Endocrine Medical History: Positive for: Type II Diabetes Time with diabetes: 21 years Treated with: Oral agents Blood sugar tested every day: Yes Tested : Q am Blood sugar testing results: Breakfast: 202 Musculoskeletal Medical History: Positive for: Osteoarthritis Past Medical History Notes: Spinal Stenosis Neurologic Medical History: Positive for: Neuropathy Oncologic Medical History: Positive for: Received Chemotherapy Negative for: Received Radiation Past Medical History Notes: 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 HBO Extended History Items Eyes: Cataracts Immunizations Pneumococcal Vaccine: Received Pneumococcal Vaccination: Yes Received Pneumococcal Vaccination On or After 60th Birthday:  No Implantable Devices Yes Hospitalization / Surgery History Type of Hospitalization/Surgery cellulitis right leg 10/25-10/28/2023 balloon angioplasty 03/2021 12/29/2021 CHF Family and Social History JESUS, LAPENTA ([id number]) [id number] Page 12 of 12 Cancer: Yes - Mother; Diabetes: Yes - Maternal Grandparents; Heart Disease: Yes - Paternal Grandparents; Hereditary Spherocytosis: No; Hypertension: Yes - Paternal Grandparents; Kidney Disease: No; Lung Disease: No; Seizures: Yes - Child; Stroke: Yes - Father; Thyroid Problems: No; Tuberculosis: No; Never smoker; Marital Status - Divorced; Alcohol Use: Never; Drug Use: No History; Caffeine Use: Rarely; Financial Concerns: No; Food, Clothing or Shelter Needs: No; Support System  BERKELEY, GOCHNOUR ([id number]) [id number] Page 1 of 12 Visit Report for 03/25/2023 Chief Complaint Document Details Patient Name: Date of Service: Richard Alert [name] 03/25/2023 9:30 A M Medical Record Number: [id number] Patient Account Number: [id number] Date of Birth/Sex: Treating RN: 08-16-1940 (82 y.o. M) Primary Care Provider: Seabron Spates Other Clinician: Referring Provider: Treating Provider/Extender: Verner Mould in Treatment: 4 Information Obtained from: Patient Chief Complaint 01/12/2021; Wounds to his left upper extremity and bilateral lower extremities. 05/14/2022; bilateral lower extremity wounds, right plantar foot wound 09/05/2022; bilateral lower extremity wounds, left thumb wound 02/25/2023: left foot DFU and right knee and RLE wounds Electronic Signature(s) Signed: 03/25/2023 10:25:23 AM By: Duanne Guess MD FACS Entered By: Duanne Guess on 03/25/2023 07:25:23 -------------------------------------------------------------------------------- Debridement Details Patient Name: Date of Service: Richard [name], [name] 03/25/2023 9:30 A M Medical Record Number: [id number] Patient Account Number: [id number] Date of Birth/Sex: Treating RN: 1940-07-10 (82 y.o. Dianna Limbo Primary Care Provider: Seabron Spates Other Clinician: Referring Provider: Treating Provider/Extender: Verner Mould in Treatment: 4 Debridement Performed for Assessment: Wound #15 Right,Medial Lower Leg Performed By: Physician Duanne Guess, MD The following information was scribed by: Karie Schwalbe The information was scribed for: Duanne Guess Debridement Type: Debridement Level of Consciousness (Pre-procedure): Awake and Alert Pre-procedure Verification/Time Out Yes - 09:50 Taken: Start Time: 09:50 Pain Control: Lidocaine 4% T opical Solution Percent of Wound Bed Debrided: 100% T Area  Debrided (cm): otal 0.2 Tissue and other material debrided: Non-Viable, Eschar, Slough, Slough Level: Non-Viable Tissue Debridement Description: Selective/Open Wound Instrument: Curette Bleeding: Minimum Hemostasis Achieved: Silver Nitrate End Time: 09:52 Procedural Pain: 0 Post Procedural Pain: 0 Response to Treatment: Procedure was tolerated well Level of Consciousness (Post- Awake and Alert procedure): Post Debridement Measurements of Total Wound Length: (cm) 0.5 HERRON, LIPPI ([id number]) [id number] Page 2 of 12 Width: (cm) 0.5 Depth: (cm) 0.1 Volume: (cm) 0.02 Character of Wound/Ulcer Post Debridement: Improved Post Procedure Diagnosis Same as Pre-procedure Electronic Signature(s) Signed: 03/25/2023 10:29:38 AM By: Karie Schwalbe RN Signed: 03/25/2023 10:42:46 AM By: Duanne Guess MD FACS Entered By: Karie Schwalbe on 03/25/2023 06:56:07 -------------------------------------------------------------------------------- Debridement Details Patient Name: Date of Service: Richard [name], [name] 03/25/2023 9:30 A M Medical Record Number: [id number] Patient Account Number: [id number] Date of Birth/Sex: Treating RN: 1941/02/07 (82 y.o. Dianna Limbo Primary Care Provider: Seabron Spates Other Clinician: Referring Provider: Treating Provider/Extender: Verner Mould in Treatment: 4 Debridement Performed for Assessment: Wound #16 Left,Anterior Lower Leg Performed By: Physician Duanne Guess, MD The following information was scribed by: Karie Schwalbe The information was scribed for: Duanne Guess Debridement Type: Debridement Level of Consciousness (Pre-procedure): Awake and Alert Pre-procedure Verification/Time Out Yes - 09:50 Taken: Start Time: 09:50 Pain Control: Lidocaine 4% T opical Solution Percent of Wound Bed Debrided: 100% T Area Debrided (cm): otal 2.12 Tissue and other material debrided:  Non-Viable, Eschar, Slough, Slough Level: Non-Viable Tissue Debridement Description: Selective/Open Wound Instrument: Curette Bleeding: Minimum Hemostasis Achieved: Silver Nitrate End Time: 09:52 Procedural Pain: 0 Post Procedural Pain: 0 Response to Treatment: Procedure was tolerated well Level of Consciousness (Post- Awake and Alert procedure): Post Debridement Measurements of Total Wound Length: (cm) 1.8 Width: (cm) 1.5 Depth: (cm) 0.1 Volume: (cm) 0.212 Character of Wound/Ulcer Post Debridement: Improved Post Procedure Diagnosis Same as Pre-procedure Electronic Signature(s) Signed: 03/25/2023 10:29:38 AM By: Karie Schwalbe RN Signed: 03/25/2023 10:42:46 AM By: Duanne Guess MD FACS Entered By: Karie Schwalbe on 03/25/2023 07:00:36 Lajoyce Lauber

## 2023-03-25 NOTE — Progress Notes (Signed)
103/64 Accompanied By: self Schedule Follow-up Appointment: Yes Clinical Summary of Care: Patient Richard [name], Richard [name] ([id number]) [id number].pdf Page 3 of 11 Electronic Signature(s) Signed: 03/25/2023 10:29:38 AM By: Karie Schwalbe RN Entered By: Karie Schwalbe on 03/25/2023 07:29:11 -------------------------------------------------------------------------------- Lower Extremity Assessment Details Patient Name: Date of Service: Richard Alert [name] 03/25/2023 9:30 A M Medical Record Number: [id number] Patient Account Number: [id number] Date of Birth/Sex: Treating RN: October 15, 1940 (82 y.o. Richard [name] Primary Care Richard [name]: Seabron Spates Other Clinician: Referring Ysidro Ramsay: Treating Kirt Chew/Extender: Verner Mould in Treatment: 4 Edema Assessment Assessed: Kyra Searles: No] Franne Forts: No] [Left: Edema] [Right: :] Calf Left: Right: Point of Measurement: 35 cm From Medial Instep 37.5 cm 43 cm Ankle Left: Right: Point of  Measurement: 11 cm From Medial Instep 23.5 cm 25 cm Knee To Floor Left: Right: From Medial Instep 45 cm Vascular Assessment Pulses: Dorsalis Pedis Palpable: [Left:Yes] Extremity colors, hair growth, and conditions: Extremity Color: [Left:Hyperpigmented] [Right:Hyperpigmented] Hair Growth on Extremity: [Left:No] [Right:No] Temperature of Extremity: [Left:Warm] [Right:Warm] Capillary Refill: [Left:< 3 seconds] [Right:< 3 seconds] Dependent Rubor: [Left:No No] [Right:No No] Toe Nail Assessment Left: Right: Thick: Yes Discolored: No Deformed: No Improper Length and Hygiene: No Electronic Signature(s) Signed: 03/25/2023 10:29:38 AM By: Karie Schwalbe RN Entered By: Karie Schwalbe on 03/25/2023 06:39:55 Multi Wound Chart Details -------------------------------------------------------------------------------- [name] ([id number]) [id number] Page 4 of 11 Patient Name: Date of Service: Richard Alert [name] 03/25/2023 9:30 A M Medical Record Number: [id number] Patient Account Number: [id number] Date of Birth/Sex: Treating RN: Jul 11, 1940 (82 y.o. M) Primary Care Arminda Foglio: Seabron Spates Other Clinician: Referring Lizzie Cokley: Treating Maxi Carreras/Extender: Verner Mould in Treatment: 4 Vital Signs Height(in): 72 Pulse(bpm): 73 Weight(lbs): 230 Blood Pressure(mmHg): 103/64 Body Mass Index(BMI): 31.2 Temperature(F): 97.7 Respiratory Rate(breaths/min): 18 Wound Assessments Wound Number: [id number]  Photos: Right, Proximal, Anterior Lower Leg Right, Medial Lower Leg Left, Anterior Lower Leg Wound Location: Skin Tear/Laceration Skin T ear/Laceration Hematoma Wounding Event: Trauma, Other Trauma, Other Trauma, Other Primary Etiology: Cataracts, Anemia, Sleep Apnea, Cataracts, Anemia, Sleep Apnea, Cataracts, Anemia, Sleep Apnea, Comorbid History: Arrhythmia, Congestive Heart Failure, Arrhythmia, Congestive Heart Failure,  Arrhythmia, Congestive Heart Failure, Coronary Artery Disease, Coronary Artery Disease, Coronary Artery Disease, Hypertension, Type II Diabetes, Hypertension, Type II Diabetes, Hypertension, Type II Diabetes, Osteoarthritis, Neuropathy, Received Osteoarthritis, Neuropathy, Received Osteoarthritis, Neuropathy, Received Chemotherapy Chemotherapy Chemotherapy 02/05/2023 02/05/2023 03/08/2023 Date Acquired: 4 4 2  Weeks of Treatment: Open Open Open Wound Status: No No No Wound Recurrence: 0.6x0.5x0.1 0.5x0.5x0.1 1.8x1.5x0.1 Measurements L x W x D (cm) 0.236 0.196 2.121 A (cm) : rea 0.024 0.02 0.212 Volume (cm) : 95.40% 40.60% 55.00% % Reduction in A rea: 95.40% 39.40% 55.00% % Reduction in Volume: Full Thickness Without Exposed Full Thickness Without Exposed Full Thickness Without Exposed Classification: Support Structures Support Structures Support Structures Medium Medium Medium Exudate A mount: Serosanguineous Serosanguineous Sanguinous Exudate Type: red, brown red, brown red Exudate Color: N/A Fibrotic scar, thickened scar Distinct, outline attached Wound Margin: Large (67-100%) Medium (34-66%) Large (67-100%) Granulation A mount: Red Red, Hyper-granulation Red, Hyper-granulation, Friable Granulation Quality: Small (1-33%) Medium (34-66%) Small (1-33%) Necrotic A mount: Eschar, Adherent Slough Eschar, Adherent Slough Eschar, Adherent Slough Necrotic Tissue: Fat Layer (Subcutaneous Tissue): Yes Fat Layer (Subcutaneous Tissue): Yes Fat Layer (Subcutaneous Tissue): Yes Exposed Structures: Small (1-33%) Small (1-33%) Small (1-33%) Epithelialization: N/A Debridement - Selective/Open Wound Debridement - Selective/Open Wound Debridement: Pre-procedure Verification/Time Out N/A 09:50 09:50 Taken: N/A Lidocaine 4% T opical Solution Lidocaine 4% T  103/64 Accompanied By: self Schedule Follow-up Appointment: Yes Clinical Summary of Care: Patient Richard [name], Richard [name] ([id number]) [id number].pdf Page 3 of 11 Electronic Signature(s) Signed: 03/25/2023 10:29:38 AM By: Karie Schwalbe RN Entered By: Karie Schwalbe on 03/25/2023 07:29:11 -------------------------------------------------------------------------------- Lower Extremity Assessment Details Patient Name: Date of Service: Richard Alert [name] 03/25/2023 9:30 A M Medical Record Number: [id number] Patient Account Number: [id number] Date of Birth/Sex: Treating RN: October 15, 1940 (82 y.o. Richard [name] Primary Care Richard [name]: Seabron Spates Other Clinician: Referring Ysidro Ramsay: Treating Kirt Chew/Extender: Verner Mould in Treatment: 4 Edema Assessment Assessed: Kyra Searles: No] Franne Forts: No] [Left: Edema] [Right: :] Calf Left: Right: Point of Measurement: 35 cm From Medial Instep 37.5 cm 43 cm Ankle Left: Right: Point of  Measurement: 11 cm From Medial Instep 23.5 cm 25 cm Knee To Floor Left: Right: From Medial Instep 45 cm Vascular Assessment Pulses: Dorsalis Pedis Palpable: [Left:Yes] Extremity colors, hair growth, and conditions: Extremity Color: [Left:Hyperpigmented] [Right:Hyperpigmented] Hair Growth on Extremity: [Left:No] [Right:No] Temperature of Extremity: [Left:Warm] [Right:Warm] Capillary Refill: [Left:< 3 seconds] [Right:< 3 seconds] Dependent Rubor: [Left:No No] [Right:No No] Toe Nail Assessment Left: Right: Thick: Yes Discolored: No Deformed: No Improper Length and Hygiene: No Electronic Signature(s) Signed: 03/25/2023 10:29:38 AM By: Karie Schwalbe RN Entered By: Karie Schwalbe on 03/25/2023 06:39:55 Multi Wound Chart Details -------------------------------------------------------------------------------- [name] ([id number]) [id number] Page 4 of 11 Patient Name: Date of Service: Richard Alert [name] 03/25/2023 9:30 A M Medical Record Number: [id number] Patient Account Number: [id number] Date of Birth/Sex: Treating RN: Jul 11, 1940 (82 y.o. M) Primary Care Arminda Foglio: Seabron Spates Other Clinician: Referring Lizzie Cokley: Treating Maxi Carreras/Extender: Verner Mould in Treatment: 4 Vital Signs Height(in): 72 Pulse(bpm): 73 Weight(lbs): 230 Blood Pressure(mmHg): 103/64 Body Mass Index(BMI): 31.2 Temperature(F): 97.7 Respiratory Rate(breaths/min): 18 Wound Assessments Wound Number: [id number]  Photos: Right, Proximal, Anterior Lower Leg Right, Medial Lower Leg Left, Anterior Lower Leg Wound Location: Skin Tear/Laceration Skin T ear/Laceration Hematoma Wounding Event: Trauma, Other Trauma, Other Trauma, Other Primary Etiology: Cataracts, Anemia, Sleep Apnea, Cataracts, Anemia, Sleep Apnea, Cataracts, Anemia, Sleep Apnea, Comorbid History: Arrhythmia, Congestive Heart Failure, Arrhythmia, Congestive Heart Failure,  Arrhythmia, Congestive Heart Failure, Coronary Artery Disease, Coronary Artery Disease, Coronary Artery Disease, Hypertension, Type II Diabetes, Hypertension, Type II Diabetes, Hypertension, Type II Diabetes, Osteoarthritis, Neuropathy, Received Osteoarthritis, Neuropathy, Received Osteoarthritis, Neuropathy, Received Chemotherapy Chemotherapy Chemotherapy 02/05/2023 02/05/2023 03/08/2023 Date Acquired: 4 4 2  Weeks of Treatment: Open Open Open Wound Status: No No No Wound Recurrence: 0.6x0.5x0.1 0.5x0.5x0.1 1.8x1.5x0.1 Measurements L x W x D (cm) 0.236 0.196 2.121 A (cm) : rea 0.024 0.02 0.212 Volume (cm) : 95.40% 40.60% 55.00% % Reduction in A rea: 95.40% 39.40% 55.00% % Reduction in Volume: Full Thickness Without Exposed Full Thickness Without Exposed Full Thickness Without Exposed Classification: Support Structures Support Structures Support Structures Medium Medium Medium Exudate A mount: Serosanguineous Serosanguineous Sanguinous Exudate Type: red, brown red, brown red Exudate Color: N/A Fibrotic scar, thickened scar Distinct, outline attached Wound Margin: Large (67-100%) Medium (34-66%) Large (67-100%) Granulation A mount: Red Red, Hyper-granulation Red, Hyper-granulation, Friable Granulation Quality: Small (1-33%) Medium (34-66%) Small (1-33%) Necrotic A mount: Eschar, Adherent Slough Eschar, Adherent Slough Eschar, Adherent Slough Necrotic Tissue: Fat Layer (Subcutaneous Tissue): Yes Fat Layer (Subcutaneous Tissue): Yes Fat Layer (Subcutaneous Tissue): Yes Exposed Structures: Small (1-33%) Small (1-33%) Small (1-33%) Epithelialization: N/A Debridement - Selective/Open Wound Debridement - Selective/Open Wound Debridement: Pre-procedure Verification/Time Out N/A 09:50 09:50 Taken: N/A Lidocaine 4% T opical Solution Lidocaine 4% T  Service: [name], [name] 03/25/2023 9:30 A M Medical Record Number: [id number] Patient Account Number: [id number] Date of Birth/Sex: Treating RN: 05/05/1941 (82 y.o. Richard [name] Primary Care Tanishi Nault: Seabron Spates Other Clinician: Referring Samil Mecham: Treating Anetra Czerwinski/Extender: Verner Mould in Treatment: 4 Vital Signs Time Taken: 09:28 Temperature (F): 97.7 Height (in): 72 Pulse (bpm): 73 Weight (lbs): 230 Respiratory Rate (breaths/min): 18 Body Mass Index (BMI): 31.2 Blood Pressure (mmHg): 103/64 Reference Range: 80 - 120 mg / dl Electronic Signature(s) Signed: 03/25/2023 10:29:38 AM By: Karie Schwalbe RN Entered By: Karie Schwalbe on 03/25/2023 06:49:42  Details Patient Name: Date of Service: Richard Alert [name] 03/25/2023 9:30 A M Medical Record Number: [id number] Patient Account Number: [id number] Date of Birth/Sex: Treating RN: Jan 09, 1941 (82 y.o. Richard [name] Primary Care Krystale Rinkenberger: Seabron Spates Other Clinician: Referring Rhealyn Cullen: Treating Webber Michiels/Extender: Verner Mould in Treatment: 4 Wound Status Wound Number: 13 Primary Trauma, Other Etiology: Wound Location: Right, Proximal, Anterior Lower Leg Wound Open Wounding Event: Skin Tear/Laceration Status: Date Acquired: 02/05/2023 Comorbid Cataracts, Anemia, Sleep Apnea, Arrhythmia, Congestive Heart Weeks Of Treatment: 4 History: Failure, Coronary Artery Disease, Hypertension, Type II Diabetes, Clustered Wound: No Osteoarthritis, Neuropathy, Received Chemotherapy Photos Wound Measurements Length: (cm) 0.6 Width: (cm) 0.5 Depth: (cm) 0.1 Area: (cm) 0.236 Volume: (cm) 0.024 % Reduction in Area: 95.4% % Reduction in Volume: 95.4% Epithelialization: Small (1-33%) Tunneling: No Undermining: No Wound  Description Classification: Full Thickness Without Exposed Suppor Exudate Amount: Medium Exudate Type: Serosanguineous Exudate Color: red, brown t Structures Foul Odor After Cleansing: No Slough/Fibrino Yes Wound Bed Granulation Amount: Large (67-100%) Exposed Structure Granulation Quality: Red Fat Layer (Subcutaneous Tissue) Exposed: Yes Necrotic Amount: Small (1-33%) Necrotic Quality: Eschar, Adherent Slough Periwound Skin Texture Texture Color No Abnormalities Noted: No No Abnormalities Noted: Yes Scarring: Yes Temperature / Pain Temperature: No Abnormality Moisture No Abnormalities Noted: Yes Treatment Notes Wound #13 (Lower Leg) Wound Laterality: Right, Anterior, Proximal Cleanser Soap and [address] MAXSTON, KASIM ([id number]) [phone].pdf Page 8 of 11 Discharge Instruction: May shower and wash wound with dial antibacterial soap and water prior to dressing change. Vashe 5.8 (oz) Discharge Instruction: Cleanse the wound with Vashe prior to applying a clean dressing using gauze sponges, not tissue or cotton balls. Wound Cleanser Discharge Instruction: Cleanse the wound with wound cleanser prior to applying a clean dressing using gauze sponges, not tissue or cotton balls. Peri-Wound Care Topical Primary Dressing Maxorb Extra Ag+ Alginate Dressing, 4x4.75 (in/in) Discharge Instruction: Apply to wound bed as instructed Secondary Dressing Woven Gauze Sponge, Non-Sterile 4x4 in Discharge Instruction: Apply over primary dressing as directed. Secured With Compression Wrap Urgo K2 Lite, (equivalent to a 3 layer) two layer compression system, regular Discharge Instruction: Apply Urgo K2 Lite as directed (alternative to 3 layer compression). Tubular netting #5 Compression Stockings Add-Ons Electronic Signature(s) Signed: 03/25/2023 10:29:38 AM By: Karie Schwalbe RN Entered By: Karie Schwalbe on 03/25/2023  06:48:39 -------------------------------------------------------------------------------- Wound Assessment Details Patient Name: Date of Service: Richard Alert [name] 03/25/2023 9:30 A M Medical Record Number: [id number] Patient Account Number: [id number] Date of Birth/Sex: Treating RN: 1940-11-13 (82 y.o. Richard [name] Primary Care Kyaira Trantham: Seabron Spates Other Clinician: Referring Taisia Fantini: Treating Chantalle Defilippo/Extender: Verner Mould in Treatment: 4 Wound Status Wound Number: 15 Primary Trauma, Other Etiology: Wound Location: Right, Medial Lower Leg Wound Open Wounding Event: Skin Tear/Laceration Status: Date Acquired: 02/05/2023 Comorbid Cataracts, Anemia, Sleep Apnea, Arrhythmia, Congestive Heart Weeks Of Treatment: 4 History: Failure, Coronary Artery Disease, Hypertension, Type II Diabetes, Clustered Wound: No Osteoarthritis, Neuropathy, Received Chemotherapy Photos Wound Measurements Length: (cm) 0.5 AMOR, WIESNER ([id number]) Width: (cm) 0.5 Depth: (cm) 0.1 Area: (cm) 0.196 Volume: (cm) 0.02 % Reduction in Area: 40.6% [id number] Page 9 of 11 % Reduction in Volume: 39.4% Epithelialization: Small (1-33%) Tunneling: No Undermining: No Wound Description Classification: Full Thickness Without Exposed Support Structures Wound Margin: Fibrotic scar, thickened scar Exudate Amount: Medium Exudate Type: Serosanguineous Exudate Color: red, brown Foul Odor After Cleansing: No Slough/Fibrino Yes Wound Bed Granulation Amount: Medium (34-66%) Exposed Structure Granulation Quality: Red, Hyper-granulation Fat Layer (  Service: [name], [name] 03/25/2023 9:30 A M Medical Record Number: [id number] Patient Account Number: [id number] Date of Birth/Sex: Treating RN: 05/05/1941 (82 y.o. Richard [name] Primary Care Tanishi Nault: Seabron Spates Other Clinician: Referring Samil Mecham: Treating Anetra Czerwinski/Extender: Verner Mould in Treatment: 4 Vital Signs Time Taken: 09:28 Temperature (F): 97.7 Height (in): 72 Pulse (bpm): 73 Weight (lbs): 230 Respiratory Rate (breaths/min): 18 Body Mass Index (BMI): 31.2 Blood Pressure (mmHg): 103/64 Reference Range: 80 - 120 mg / dl Electronic Signature(s) Signed: 03/25/2023 10:29:38 AM By: Karie Schwalbe RN Entered By: Karie Schwalbe on 03/25/2023 06:49:42  KENNIS, NAEGELE ([id number]) [id number] Page 1 of 11 Visit Report for 03/25/2023 Arrival Information Details Patient Name: Date of Service: Richard Alert [name] 03/25/2023 9:30 A M Medical Record Number: [id number] Patient Account Number: [id number] Date of Birth/Sex: Treating RN: [birth year] (82 y.o. Richard [name] Primary Care Allora Bains: Seabron Spates Other Clinician: Referring Aryon Nham: Treating Naz Denunzio/Extender: Verner Mould in Treatment: 4 Visit Information History Since Last Visit Added or deleted any medications: No Patient Arrived: Ambulatory Any new allergies or adverse reactions: No Arrival Time: 09:28 Had a fall or experienced change in No Accompanied By: self activities of daily living that may affect Transfer Assistance: None risk of falls: Patient Identification Verified: Yes Signs or symptoms of abuse/neglect since last visito No Patient Has Alerts: Yes Hospitalized since last visit: No Patient Alerts: Patient on Blood Thinner Implantable device outside of the clinic excluding No Eliquis cellular tissue based products placed in the center since last visit: Has Dressing in Place as Prescribed: Yes Has Compression in Place as Prescribed: Yes Pain Present Now: Yes Electronic Signature(s) Signed: 03/25/2023 10:29:38 AM By: Karie Schwalbe RN Entered By: Karie Schwalbe on 03/25/2023 56:21:30 -------------------------------------------------------------------------------- Compression Therapy Details Patient Name: Date of Service: [name], [name] 03/25/2023 9:30 A M Medical Record Number: [id number] Patient Account Number: [id number] Date of Birth/Sex: Treating RN: 1941-03-07 (82 y.o. Richard [name] Primary Care Jazlene Bares: Seabron Spates Other Clinician: Referring Sayde Lish: Treating Tajah Schreiner/Extender: Verner Mould in Treatment: 4 Compression Therapy Performed for  Wound Assessment: Wound #13 Right,Proximal,Anterior Lower Leg Performed By: Clinician Karie Schwalbe, RN Compression Type: Three Layer Post Procedure Diagnosis Same as Pre-procedure Electronic Signature(s) Signed: 03/25/2023 10:29:38 AM By: Karie Schwalbe RN Entered By: Karie Schwalbe on 03/25/2023 07:00:53 [name] ([id number]) [id number] Page 2 of 11 -------------------------------------------------------------------------------- Compression Therapy Details Patient Name: Date of Service: Richard Alert [name] 03/25/2023 9:30 A M Medical Record Number: [id number] Patient Account Number: [id number] Date of Birth/Sex: Treating RN: 01/09/1941 (82 y.o. Richard [name] Primary Care Samaj Wessells: Seabron Spates Other Clinician: Referring Elecia Serafin: Treating Ernie Sagrero/Extender: Verner Mould in Treatment: 4 Compression Therapy Performed for Wound Assessment: Wound #15 Right,Medial Lower Leg Performed By: Clinician Karie Schwalbe, RN Compression Type: Three Layer Post Procedure Diagnosis Same as Pre-procedure Electronic Signature(s) Signed: 03/25/2023 10:29:38 AM By: Karie Schwalbe RN Entered By: Karie Schwalbe on 03/25/2023 07:00:53 -------------------------------------------------------------------------------- Compression Therapy Details Patient Name: Date of Service: Richard Alert [name] 03/25/2023 9:30 A M Medical Record Number: [id number] Patient Account Number: [id number] Date of Birth/Sex: Treating RN: 12/24/1940 (82 y.o. Richard [name] Primary Care Amaiah Cristiano: Seabron Spates Other Clinician: Referring Karmel Patricelli: Treating Marleen Moret/Extender: Verner Mould in Treatment: 4 Compression Therapy Performed for Wound Assessment: Wound #16 Left,Anterior Lower Leg Performed By: Clinician Karie Schwalbe, RN Compression Type: Three Layer Post Procedure Diagnosis Same as  Pre-procedure Electronic Signature(s) Signed: 03/25/2023 10:29:38 AM By: Karie Schwalbe RN Entered By: Karie Schwalbe on 03/25/2023 07:00:53 -------------------------------------------------------------------------------- Encounter Discharge Information Details Patient Name: Date of Service: [name], [name] 03/25/2023 9:30 A M Medical Record Number: [id number] Patient Account Number: [id number] Date of Birth/Sex: Treating RN: 05/31/1941 (82 y.o. Richard [name] Primary Care Simya Tercero: Seabron Spates Other Clinician: Referring Nadeem Romanoski: Treating Eliyas Suddreth/Extender: Verner Mould in Treatment: 4 Encounter Discharge Information Items Post Procedure Vitals Discharge Condition: Stable Temperature (F): 97.7 Ambulatory Status: Ambulatory Pulse (bpm): 73 Discharge Destination: Home Respiratory Rate (breaths/min): 18 Transportation: Private Auto Blood Pressure (mmHg):

## 2023-03-31 ENCOUNTER — Encounter (HOSPITAL_BASED_OUTPATIENT_CLINIC_OR_DEPARTMENT_OTHER): Payer: Medicare Other | Admitting: General Surgery

## 2023-03-31 DIAGNOSIS — I130 Hypertensive heart and chronic kidney disease with heart failure and stage 1 through stage 4 chronic kidney disease, or unspecified chronic kidney disease: Secondary | ICD-10-CM | POA: Diagnosis not present

## 2023-03-31 DIAGNOSIS — L97522 Non-pressure chronic ulcer of other part of left foot with fat layer exposed: Secondary | ICD-10-CM | POA: Diagnosis not present

## 2023-03-31 DIAGNOSIS — S81802A Unspecified open wound, left lower leg, initial encounter: Secondary | ICD-10-CM | POA: Diagnosis not present

## 2023-03-31 DIAGNOSIS — E11622 Type 2 diabetes mellitus with other skin ulcer: Secondary | ICD-10-CM | POA: Diagnosis not present

## 2023-03-31 DIAGNOSIS — N1832 Chronic kidney disease, stage 3b: Secondary | ICD-10-CM | POA: Diagnosis not present

## 2023-03-31 DIAGNOSIS — S81801A Unspecified open wound, right lower leg, initial encounter: Secondary | ICD-10-CM | POA: Diagnosis not present

## 2023-03-31 DIAGNOSIS — L97822 Non-pressure chronic ulcer of other part of left lower leg with fat layer exposed: Secondary | ICD-10-CM | POA: Diagnosis not present

## 2023-03-31 DIAGNOSIS — L97812 Non-pressure chronic ulcer of other part of right lower leg with fat layer exposed: Secondary | ICD-10-CM | POA: Diagnosis not present

## 2023-03-31 NOTE — Progress Notes (Signed)
43 cm Richard [name], Richard [name] ([id number]) [phone].pdf Page 3 of 10 Ankle Left: Right: Point of Measurement: 11 cm From Medial Instep 23.5 cm 25 cm Vascular Assessment Pulses: Dorsalis Pedis Palpable: [Left:Yes] [Right:Yes] Extremity colors, hair growth, and conditions: Extremity Color: [Left:Hyperpigmented] [Right:Hyperpigmented] Hair Growth on Extremity: [Left:No] [Right:No] Temperature of Extremity: [Left:Warm] [Right:Warm] Capillary Refill: [Left:< 3 seconds] [Right:< 3 seconds] Dependent Rubor: [Left:No No] [Right:No No] Toe Nail Assessment Left: Right: Thick: Yes Yes Discolored: No No Deformed: No No Improper Length and Hygiene: No No Electronic Signature(s) Signed: 03/31/2023 3:51:35 PM By: Karie Schwalbe RN Entered By: Karie Schwalbe on 03/31/2023 10:47:37 -------------------------------------------------------------------------------- Multi Wound Chart Details Patient Name: Date of Service: Richard [name], Richard [name] [name] 03/31/2023 10:15 A M Medical Record Number: [id number] Patient Account  Number: [id number] Date of Birth/Sex: Treating RN: 06/19/1941 (82 y.o. M) Primary Care Richard [name]: Richard [name] Other Clinician: Referring Richard [name]: Treating Richard [name]/Extender: Richard [name] in Treatment: 4 Vital Signs Height(in): 72 Pulse(bpm): 82 Weight(lbs): 230 Blood Pressure(mmHg): 118/69 Body Mass Index(BMI): 31.2 Temperature(F): 97.6 Respiratory Rate(breaths/min): 18 [13:Photos:] Right, Proximal, Anterior Lower Leg Right, Medial Lower Leg Left, Anterior Lower Leg Wound Location: Skin Tear/Laceration Skin Tear/Laceration Hematoma Wounding Event: Trauma, Other Trauma, Other Trauma, Other Primary Etiology: Cataracts, Anemia, Sleep Apnea, Cataracts, Anemia, Sleep Apnea, Cataracts, Anemia, Sleep Apnea, Comorbid History: Arrhythmia, Congestive Heart Failure, Arrhythmia, Congestive Heart Failure, Arrhythmia, Congestive Heart Failure, Coronary Artery Disease, Coronary Artery Disease, Coronary Artery Disease, Hypertension, Type II Diabetes, Hypertension, Type II Diabetes, Hypertension, Type II Diabetes, Osteoarthritis, Neuropathy, Received Osteoarthritis, Neuropathy, Received Osteoarthritis, Neuropathy, Received Chemotherapy Chemotherapy Chemotherapy 02/05/2023 02/05/2023 03/08/2023 Date Acquired: 4 4 2  Weeks of Treatment: Richard [name] ([id number]) [id number] Page 4 of 10 Open Open Open Wound Status: No No No Wound Recurrence: 0.1x0.1x0.1 0.4x0.5x0.1 2x1.5x0.1 Measurements L x W x D (cm) 0.008 0.157 2.356 A (cm) : rea 0.001 0.016 0.236 Volume (cm) : 99.80% 52.40% 50.00% % Reduction in Area: 99.80% 51.50% 49.90% % Reduction in Volume: Full Thickness Without Exposed Full Thickness Without Exposed Full Thickness Without Exposed Classification: Support Structures Support Structures Support Structures Medium Medium Medium Exudate A mount: Serosanguineous Serosanguineous Sanguinous Exudate Type: red, brown red,  brown red Exudate Color: N/A Fibrotic scar, thickened scar Distinct, outline attached Wound Margin: Large (67-100%) Medium (34-66%) Large (67-100%) Granulation A mount: Red Red, Hyper-granulation Red, Hyper-granulation, Friable Granulation Quality: Small (1-33%) Medium (34-66%) Small (1-33%) Necrotic A mount: Eschar, Adherent Slough Eschar, Adherent Slough Eschar, Adherent Slough Necrotic Tissue: Fat Layer (Subcutaneous Tissue): Yes Fat Layer (Subcutaneous Tissue): Yes Fat Layer (Subcutaneous Tissue): Yes Exposed Structures: Small (1-33%) Small (1-33%) Small (1-33%) Epithelialization: N/A Debridement - Selective/Open Wound Debridement - Selective/Open Wound Debridement: Pre-procedure Verification/Time Out N/A 10:50 10:50 Taken: N/A Lidocaine 4% Topical Solution Lidocaine 4% Topical Solution Pain Control: N/A Ambulance person, Slough Tissue Debrided: N/A Non-Viable Tissue Non-Viable Tissue Level: N/A 0.16 2.36 Debridement A (sq cm): rea N/A Curette Curette Instrument: N/A Minimum Minimum Bleeding: N/A Pressure Pressure Hemostasis A chieved: N/A 0 0 Procedural Pain: N/A 0 0 Post Procedural Pain: N/A Procedure was tolerated well Procedure was tolerated well Debridement Treatment Response: N/A 0.4x0.5x0.1 2x1.5x0.1 Post Debridement Measurements L x W x D (cm) N/A 0.016 0.236 Post Debridement Volume: (cm) Scarring: Yes No Abnormalities Noted Scarring: Yes Periwound Skin Texture: No Abnormalities Noted No Abnormalities Noted No Abnormalities Noted Periwound Skin Moisture: No Abnormalities Noted No Abnormalities Noted Hemosiderin Staining: Yes Periwound Skin Color: No Abnormality No Abnormality No Abnormality Temperature: N/A Debridement N/A Procedures Performed: Treatment Notes  Eschar, Adherent Slough Periwound Skin Texture Texture Color No Abnormalities Noted: No No  Abnormalities Noted: No Scarring: Yes Hemosiderin Staining: Yes Moisture Temperature / Pain No Abnormalities Noted: Yes Temperature: No Abnormality Treatment Notes Wound #16 (Lower Leg) Wound Laterality: Left, Anterior Cleanser Soap and Water Discharge Instruction: May shower and wash wound with dial antibacterial soap and water prior to dressing change. Vashe 5.8 (oz) Discharge Instruction: Cleanse the wound with Vashe prior to applying a clean dressing using gauze sponges, not tissue or cotton balls. Wound Cleanser Discharge Instruction: Cleanse the wound with wound cleanser prior to applying a clean dressing using gauze sponges, not tissue or cotton balls. Peri-Wound Care Topical Primary Dressing Hydrofera Blue Ready Transfer Foam, 2.5x2.5 (in/in) Discharge Instruction: Apply directly to wound bed as directed Secondary Dressing Woven Gauze Sponge, Non-Sterile 4x4 in Discharge Instruction: Apply over primary dressing as directed. Secured With Compression Wrap Urgo K2 Lite, (equivalent to a 3 layer) two layer compression system, regular Discharge Instruction: Apply Urgo K2 Lite as directed (alternative to 3 layer compression). Tubular netting #5 Compression Stockings Add-Ons Richard [name] ([id number]) [phone].pdf Page 10 of 10 Electronic Signature(s) Signed: 03/31/2023 3:51:35 PM By: Karie Schwalbe RN Entered By: Karie Schwalbe on 03/31/2023 10:50:12 -------------------------------------------------------------------------------- Vitals Details Patient Name: Date of Service: Richard [name], Richard [name] [name] 03/31/2023 10:15 A M Medical Record Number: [id number] Patient Account Number: [id number] Date of Birth/Sex: Treating RN: Feb 23, 1941 (82 y.o. M) Primary Care Margarethe Virgen: Richard [name] Other Clinician: Referring Terah Robey: Treating Ruxin Ransome/Extender: Richard [name] in Treatment: 4 Vital Signs Time Taken: 10:23 Temperature  (F): 97.6 Height (in): 72 Pulse (bpm): 82 Weight (lbs): 230 Respiratory Rate (breaths/min): 18 Body Mass Index (BMI): 31.2 Blood Pressure (mmHg): 118/69 Reference Range: 80 - 120 mg / dl Electronic Signature(s) Signed: 03/31/2023 11:37:32 AM By: Dayton Scrape Entered By: Dayton Scrape on 03/31/2023 10:23:59  ALAND, CHESTNUTT ([id number]) [id number] Page 1 of 10 Visit Report for 03/31/2023 Arrival Information Details Patient Name: Date of Service: [name] 03/31/2023 10:15 A M Medical Record Number: [id number] Patient Account Number: [id number] Date of Birth/Sex: Treating RN: 26-Mar-1941 (82 y.o. M) Primary Care Lakoda Mcanany: Richard [name] Other Clinician: Referring Irja Wheless: Treating Hazael Olveda/Extender: Richard [name] in Treatment: 4 Visit Information History Since Last Visit Added or deleted any medications: No Patient Arrived: Ambulatory Any new allergies or adverse reactions: No Arrival Time: 10:23 Had a fall or experienced change in No Accompanied By: self activities of daily living that may affect Transfer Assistance: None risk of falls: Patient Identification Verified: Yes Signs or symptoms of abuse/neglect since last visito No Secondary Verification Process Completed: Yes Hospitalized since last visit: No Patient Has Alerts: Yes Implantable device outside of the clinic excluding No Patient Alerts: Patient on Blood Thinner cellular tissue based products placed in the center Eliquis since last visit: Pain Present Now: No Electronic Signature(s) Signed: 03/31/2023 11:37:32 AM By: Dayton Scrape Entered By: Dayton Scrape on 03/31/2023 10:23:41 -------------------------------------------------------------------------------- Compression Therapy Details Patient Name: Date of Service: [name] 03/31/2023 10:15 A M Medical Record Number: [id number] Patient Account Number: [id number] Date of Birth/Sex: Treating RN: January 01, 1941 (82 y.o. Dianna Limbo Primary Care Biddie Sebek: Richard [name] Other Clinician: Referring Ivery Michalski: Treating Lubertha Leite/Extender: Richard [name] in Treatment: 4 Compression Therapy Performed for Wound Assessment: Wound #15 Right,Medial Lower Leg Performed By:  Clinician Karie Schwalbe, RN Compression Type: Three Layer Post Procedure Diagnosis Same as Pre-procedure Electronic Signature(s) Signed: 03/31/2023 3:51:35 PM By: Karie Schwalbe RN Entered By: Karie Schwalbe on 03/31/2023 10:56:40 Compression Therapy Details -------------------------------------------------------------------------------- [name] ([id number]) [id number] Page 2 of 10 Patient Name: Date of Service: [name] 03/31/2023 10:15 A M Medical Record Number: [id number] Patient Account Number: [id number] Date of Birth/Sex: Treating RN: 1940/07/04 (82 y.o. Dianna Limbo Primary Care Aryah Doering: Richard [name] Other Clinician: Referring Kanye Depree: Treating Erlin Gardella/Extender: Richard [name] in Treatment: 4 Compression Therapy Performed for Wound Assessment: Wound #16 Left,Anterior Lower Leg Performed By: Clinician Karie Schwalbe, RN Compression Type: Three Layer Post Procedure Diagnosis Same as Pre-procedure Electronic Signature(s) Signed: 03/31/2023 3:51:35 PM By: Karie Schwalbe RN Entered By: Karie Schwalbe on 03/31/2023 10:56:40 -------------------------------------------------------------------------------- Encounter Discharge Information Details Patient Name: Date of Service: Richard [name], Richard [name] [name] 03/31/2023 10:15 A M Medical Record Number: [id number] Patient Account Number: [id number] Date of Birth/Sex: Treating RN: 1940-09-23 (82 y.o. Dianna Limbo Primary Care Genova Kiner: Richard [name] Other Clinician: Referring Anisten Tomassi: Treating Stefany Starace/Extender: Richard [name] in Treatment: 4 Encounter Discharge Information Items Post Procedure Vitals Discharge Condition: Stable Temperature (F): 97.6 Ambulatory Status: Ambulatory Pulse (bpm): 87 Discharge Destination: Home Respiratory Rate (breaths/min): 18 Transportation: Private Auto Blood Pressure (mmHg):  118/69 Accompanied By: self Schedule Follow-up Appointment: Yes Clinical Summary of Care: Patient Declined Electronic Signature(s) Signed: 03/31/2023 3:51:35 PM By: Karie Schwalbe RN Entered By: Karie Schwalbe on 03/31/2023 15:51:13 -------------------------------------------------------------------------------- Lower Extremity Assessment Details Patient Name: Date of Service: [name] 03/31/2023 10:15 A M Medical Record Number: [id number] Patient Account Number: [id number] Date of Birth/Sex: Treating RN: 1941-04-20 (82 y.o. Dianna Limbo Primary Care Kamiryn Bezanson: Richard [name] Other Clinician: Referring Kerria Sapien: Treating Vandell Kun/Extender: Richard [name] in Treatment: 4 Edema Assessment Assessed: [Left: No] [Right: No] [Left: Edema] [Right: :] Calf Left: Right: Point of Measurement: 35 cm From Medial Instep 37.5 cm  43 cm Richard [name], Richard [name] ([id number]) [phone].pdf Page 3 of 10 Ankle Left: Right: Point of Measurement: 11 cm From Medial Instep 23.5 cm 25 cm Vascular Assessment Pulses: Dorsalis Pedis Palpable: [Left:Yes] [Right:Yes] Extremity colors, hair growth, and conditions: Extremity Color: [Left:Hyperpigmented] [Right:Hyperpigmented] Hair Growth on Extremity: [Left:No] [Right:No] Temperature of Extremity: [Left:Warm] [Right:Warm] Capillary Refill: [Left:< 3 seconds] [Right:< 3 seconds] Dependent Rubor: [Left:No No] [Right:No No] Toe Nail Assessment Left: Right: Thick: Yes Yes Discolored: No No Deformed: No No Improper Length and Hygiene: No No Electronic Signature(s) Signed: 03/31/2023 3:51:35 PM By: Karie Schwalbe RN Entered By: Karie Schwalbe on 03/31/2023 10:47:37 -------------------------------------------------------------------------------- Multi Wound Chart Details Patient Name: Date of Service: Richard [name], Richard [name] [name] 03/31/2023 10:15 A M Medical Record Number: [id number] Patient Account  Number: [id number] Date of Birth/Sex: Treating RN: 06/19/1941 (82 y.o. M) Primary Care Richard [name]: Richard [name] Other Clinician: Referring Richard [name]: Treating Richard [name]/Extender: Richard [name] in Treatment: 4 Vital Signs Height(in): 72 Pulse(bpm): 82 Weight(lbs): 230 Blood Pressure(mmHg): 118/69 Body Mass Index(BMI): 31.2 Temperature(F): 97.6 Respiratory Rate(breaths/min): 18 [13:Photos:] Right, Proximal, Anterior Lower Leg Right, Medial Lower Leg Left, Anterior Lower Leg Wound Location: Skin Tear/Laceration Skin Tear/Laceration Hematoma Wounding Event: Trauma, Other Trauma, Other Trauma, Other Primary Etiology: Cataracts, Anemia, Sleep Apnea, Cataracts, Anemia, Sleep Apnea, Cataracts, Anemia, Sleep Apnea, Comorbid History: Arrhythmia, Congestive Heart Failure, Arrhythmia, Congestive Heart Failure, Arrhythmia, Congestive Heart Failure, Coronary Artery Disease, Coronary Artery Disease, Coronary Artery Disease, Hypertension, Type II Diabetes, Hypertension, Type II Diabetes, Hypertension, Type II Diabetes, Osteoarthritis, Neuropathy, Received Osteoarthritis, Neuropathy, Received Osteoarthritis, Neuropathy, Received Chemotherapy Chemotherapy Chemotherapy 02/05/2023 02/05/2023 03/08/2023 Date Acquired: 4 4 2  Weeks of Treatment: Richard [name] ([id number]) [id number] Page 4 of 10 Open Open Open Wound Status: No No No Wound Recurrence: 0.1x0.1x0.1 0.4x0.5x0.1 2x1.5x0.1 Measurements L x W x D (cm) 0.008 0.157 2.356 A (cm) : rea 0.001 0.016 0.236 Volume (cm) : 99.80% 52.40% 50.00% % Reduction in Area: 99.80% 51.50% 49.90% % Reduction in Volume: Full Thickness Without Exposed Full Thickness Without Exposed Full Thickness Without Exposed Classification: Support Structures Support Structures Support Structures Medium Medium Medium Exudate A mount: Serosanguineous Serosanguineous Sanguinous Exudate Type: red, brown red,  brown red Exudate Color: N/A Fibrotic scar, thickened scar Distinct, outline attached Wound Margin: Large (67-100%) Medium (34-66%) Large (67-100%) Granulation A mount: Red Red, Hyper-granulation Red, Hyper-granulation, Friable Granulation Quality: Small (1-33%) Medium (34-66%) Small (1-33%) Necrotic A mount: Eschar, Adherent Slough Eschar, Adherent Slough Eschar, Adherent Slough Necrotic Tissue: Fat Layer (Subcutaneous Tissue): Yes Fat Layer (Subcutaneous Tissue): Yes Fat Layer (Subcutaneous Tissue): Yes Exposed Structures: Small (1-33%) Small (1-33%) Small (1-33%) Epithelialization: N/A Debridement - Selective/Open Wound Debridement - Selective/Open Wound Debridement: Pre-procedure Verification/Time Out N/A 10:50 10:50 Taken: N/A Lidocaine 4% Topical Solution Lidocaine 4% Topical Solution Pain Control: N/A Ambulance person, Slough Tissue Debrided: N/A Non-Viable Tissue Non-Viable Tissue Level: N/A 0.16 2.36 Debridement A (sq cm): rea N/A Curette Curette Instrument: N/A Minimum Minimum Bleeding: N/A Pressure Pressure Hemostasis A chieved: N/A 0 0 Procedural Pain: N/A 0 0 Post Procedural Pain: N/A Procedure was tolerated well Procedure was tolerated well Debridement Treatment Response: N/A 0.4x0.5x0.1 2x1.5x0.1 Post Debridement Measurements L x W x D (cm) N/A 0.016 0.236 Post Debridement Volume: (cm) Scarring: Yes No Abnormalities Noted Scarring: Yes Periwound Skin Texture: No Abnormalities Noted No Abnormalities Noted No Abnormalities Noted Periwound Skin Moisture: No Abnormalities Noted No Abnormalities Noted Hemosiderin Staining: Yes Periwound Skin Color: No Abnormality No Abnormality No Abnormality Temperature: N/A Debridement N/A Procedures Performed: Treatment Notes  43 cm Richard [name], Richard [name] ([id number]) [phone].pdf Page 3 of 10 Ankle Left: Right: Point of Measurement: 11 cm From Medial Instep 23.5 cm 25 cm Vascular Assessment Pulses: Dorsalis Pedis Palpable: [Left:Yes] [Right:Yes] Extremity colors, hair growth, and conditions: Extremity Color: [Left:Hyperpigmented] [Right:Hyperpigmented] Hair Growth on Extremity: [Left:No] [Right:No] Temperature of Extremity: [Left:Warm] [Right:Warm] Capillary Refill: [Left:< 3 seconds] [Right:< 3 seconds] Dependent Rubor: [Left:No No] [Right:No No] Toe Nail Assessment Left: Right: Thick: Yes Yes Discolored: No No Deformed: No No Improper Length and Hygiene: No No Electronic Signature(s) Signed: 03/31/2023 3:51:35 PM By: Karie Schwalbe RN Entered By: Karie Schwalbe on 03/31/2023 10:47:37 -------------------------------------------------------------------------------- Multi Wound Chart Details Patient Name: Date of Service: Richard [name], Richard [name] [name] 03/31/2023 10:15 A M Medical Record Number: [id number] Patient Account  Number: [id number] Date of Birth/Sex: Treating RN: 06/19/1941 (82 y.o. M) Primary Care Richard [name]: Richard [name] Other Clinician: Referring Richard [name]: Treating Richard [name]/Extender: Richard [name] in Treatment: 4 Vital Signs Height(in): 72 Pulse(bpm): 82 Weight(lbs): 230 Blood Pressure(mmHg): 118/69 Body Mass Index(BMI): 31.2 Temperature(F): 97.6 Respiratory Rate(breaths/min): 18 [13:Photos:] Right, Proximal, Anterior Lower Leg Right, Medial Lower Leg Left, Anterior Lower Leg Wound Location: Skin Tear/Laceration Skin Tear/Laceration Hematoma Wounding Event: Trauma, Other Trauma, Other Trauma, Other Primary Etiology: Cataracts, Anemia, Sleep Apnea, Cataracts, Anemia, Sleep Apnea, Cataracts, Anemia, Sleep Apnea, Comorbid History: Arrhythmia, Congestive Heart Failure, Arrhythmia, Congestive Heart Failure, Arrhythmia, Congestive Heart Failure, Coronary Artery Disease, Coronary Artery Disease, Coronary Artery Disease, Hypertension, Type II Diabetes, Hypertension, Type II Diabetes, Hypertension, Type II Diabetes, Osteoarthritis, Neuropathy, Received Osteoarthritis, Neuropathy, Received Osteoarthritis, Neuropathy, Received Chemotherapy Chemotherapy Chemotherapy 02/05/2023 02/05/2023 03/08/2023 Date Acquired: 4 4 2  Weeks of Treatment: Richard [name] ([id number]) [id number] Page 4 of 10 Open Open Open Wound Status: No No No Wound Recurrence: 0.1x0.1x0.1 0.4x0.5x0.1 2x1.5x0.1 Measurements L x W x D (cm) 0.008 0.157 2.356 A (cm) : rea 0.001 0.016 0.236 Volume (cm) : 99.80% 52.40% 50.00% % Reduction in Area: 99.80% 51.50% 49.90% % Reduction in Volume: Full Thickness Without Exposed Full Thickness Without Exposed Full Thickness Without Exposed Classification: Support Structures Support Structures Support Structures Medium Medium Medium Exudate A mount: Serosanguineous Serosanguineous Sanguinous Exudate Type: red, brown red,  brown red Exudate Color: N/A Fibrotic scar, thickened scar Distinct, outline attached Wound Margin: Large (67-100%) Medium (34-66%) Large (67-100%) Granulation A mount: Red Red, Hyper-granulation Red, Hyper-granulation, Friable Granulation Quality: Small (1-33%) Medium (34-66%) Small (1-33%) Necrotic A mount: Eschar, Adherent Slough Eschar, Adherent Slough Eschar, Adherent Slough Necrotic Tissue: Fat Layer (Subcutaneous Tissue): Yes Fat Layer (Subcutaneous Tissue): Yes Fat Layer (Subcutaneous Tissue): Yes Exposed Structures: Small (1-33%) Small (1-33%) Small (1-33%) Epithelialization: N/A Debridement - Selective/Open Wound Debridement - Selective/Open Wound Debridement: Pre-procedure Verification/Time Out N/A 10:50 10:50 Taken: N/A Lidocaine 4% Topical Solution Lidocaine 4% Topical Solution Pain Control: N/A Ambulance person, Slough Tissue Debrided: N/A Non-Viable Tissue Non-Viable Tissue Level: N/A 0.16 2.36 Debridement A (sq cm): rea N/A Curette Curette Instrument: N/A Minimum Minimum Bleeding: N/A Pressure Pressure Hemostasis A chieved: N/A 0 0 Procedural Pain: N/A 0 0 Post Procedural Pain: N/A Procedure was tolerated well Procedure was tolerated well Debridement Treatment Response: N/A 0.4x0.5x0.1 2x1.5x0.1 Post Debridement Measurements L x W x D (cm) N/A 0.016 0.236 Post Debridement Volume: (cm) Scarring: Yes No Abnormalities Noted Scarring: Yes Periwound Skin Texture: No Abnormalities Noted No Abnormalities Noted No Abnormalities Noted Periwound Skin Moisture: No Abnormalities Noted No Abnormalities Noted Hemosiderin Staining: Yes Periwound Skin Color: No Abnormality No Abnormality No Abnormality Temperature: N/A Debridement N/A Procedures Performed: Treatment Notes

## 2023-03-31 NOTE — Progress Notes (Signed)
not tissue or cotton balls. Prim Dressing: Hydrofera Blue Ready Transfer Foam, 2.5x2.5 (in/in) 1 x Per Week/30 Days ary Discharge Instructions: Apply directly to wound bed as directed Secondary Dressing: Woven Gauze Sponge, Non-Sterile 4x4 in (Generic) 1 x Per Week/30 Days Discharge Instructions: Apply over primary dressing as directed. Compression Wrap: Urgo K2 Lite, (equivalent to a 3 layer) two layer compression system, regular (Generic) 1 x Per Week/30 Days Discharge Instructions: Apply Urgo K2 Lite as directed (alternative to 3 layer compression). Compression Wrap: Tubular netting #5 (Generic) 1 x Per Week/30 Days Wound #16 - Lower Leg Wound Laterality: Left, Anterior Cleanser: Soap and Water 1 x Per Week/30 Days Discharge Instructions: May shower and wash wound with dial antibacterial soap and water prior to dressing change. Cleanser: Vashe 5.8 (oz) 1 x Per Week/30 Days Discharge Instructions: Cleanse the wound with Vashe prior to applying a clean dressing using gauze sponges, not tissue or cotton balls. Cleanser: Wound Cleanser 1 x Per Week/30 Days Discharge Instructions: Cleanse the wound with wound cleanser prior to applying a clean dressing using gauze sponges, not tissue or cotton balls. Prim Dressing: Hydrofera Blue Ready Transfer Foam, 2.5x2.5 (in/in) 1 x Per Week/30 Days ary Discharge Instructions: Apply directly to wound bed as directed Secondary Dressing: Woven Gauze Sponge, Non-Sterile 4x4 in (Generic) 1 x Per Week/30 Days Discharge Instructions: Apply over primary dressing as directed. Compression Wrap: Urgo  K2 Lite, (equivalent to a 3 layer) two layer compression system, regular (Generic) 1 x Per Week/30 Days Discharge Instructions: Apply Urgo K2 Lite as directed (alternative to 3 layer compression). Compression Wrap: Tubular netting #5 (Generic) 1 x Per Week/30 Days Electronic Signature(s) Signed: 03/31/2023 1:01:20 PM By: Duanne Guess MD FACS Signed: 03/31/2023 3:51:35 PM By: Karie Schwalbe RN Previous Signature: 03/31/2023 10:57:56 AM Version By: Duanne Guess MD FACS Entered By: Karie Schwalbe on 03/31/2023 10:58:32 BLADIMIR, AUMAN ([id number]) [id number] Page 6 of 12 -------------------------------------------------------------------------------- Problem List Details Patient Name: Date of Service: [name] 03/31/2023 10:15 A M Medical Record Number: [id number] Patient Account Number: [id number] Date of Birth/Sex: Treating RN: [birth year] (82 y.o. M) Primary Care Provider: Seabron Spates Other Clinician: Referring Provider: Treating Provider/Extender: Verner Mould in Treatment: 4 Active Problems ICD-10 Encounter Code Description Active Date MDM Diagnosis [id number] Non-pressure chronic ulcer of other part of right lower leg with fat layer 02/25/2023 No Yes exposed L97.822 Non-pressure chronic ulcer of other part of left lower leg with fat layer exposed9/04/2023 No Yes E11.622 Type 2 diabetes mellitus with other skin ulcer 02/25/2023 No Yes I50.42 Chronic combined systolic (congestive) and diastolic (congestive) heart failure 02/25/2023 No Yes N18.32 Chronic kidney disease, stage 3b 02/25/2023 No Yes R60.0 Localized edema 02/25/2023 No Yes Inactive Problems ICD-10 Code Description Active Date Inactive Date E11.621 Type 2 diabetes mellitus with foot ulcer 02/25/2023 02/25/2023 Resolved Problems ICD-10 Code Description Active Date Resolved Date L97.522 Non-pressure chronic ulcer of other part of left foot with fat  layer exposed 02/25/2023 02/25/2023 Electronic Signature(s) Signed: 03/31/2023 10:55:59 AM By: Duanne Guess MD FACS Entered By: Duanne Guess on 03/31/2023 10:55:59 [name] ([id number]) [id number] Page 7 of 12 -------------------------------------------------------------------------------- Progress Note Details Patient Name: Date of Service: [name] 03/31/2023 10:15 A M Medical Record Number: [id number] Patient Account Number: [id number] Date of Birth/Sex: Treating RN: June 11, 1941 (82 y.o. M) Primary Care Provider: Seabron Spates Other Clinician: Referring Provider: Treating Provider/Extender: Verner Mould in Treatment: 4 Subjective Chief Complaint Information obtained from  is good. 03/31/2023: The proximal right leg wound is closed. The distal right lower leg wound is smaller with a little bit of slough on the surface. The left anterior tibial wound is more superficial but with essentially the same measurements this week. Slough has accumulated on the surface here, as well. Edema control remains good. Electronic Signature(s) Signed: 03/31/2023 10:57:21 AM By: Duanne Guess MD FACS Entered By: Duanne Guess on 03/31/2023 10:57:21 -------------------------------------------------------------------------------- Physical Exam Details Patient Name: Date of Service: [name] 03/31/2023 10:15 A M Medical Record Number: [id number] Patient Account Number: [id number] Date of Birth/Sex: Treating RN: 1940-07-31 (82 y.o. M) Primary Care Provider: Seabron Spates Other Clinician: Referring Provider: Treating Provider/Extender: Verner Mould in Treatment: 4 Constitutional . . . . no acute distress. Respiratory Normal work of breathing on room air. Notes 03/31/2023: The proximal right leg wound is closed. The distal right lower leg wound is smaller with a little bit of slough on the surface. The left anterior tibial wound is more superficial but with essentially the same measurements this week. Slough has accumulated on the surface here, as well. Edema control remains good. Electronic Signature(s) Signed: 03/31/2023 10:57:47 AM By: Duanne Guess MD FACS Entered By: Duanne Guess on 03/31/2023 10:57:47 -------------------------------------------------------------------------------- Physician Orders Details Patient Name: Date of Service: [name], [name] 03/31/2023 10:15 A M Medical Record Number: [id number] Patient Account Number:  [id number] Date of Birth/Sex: Treating RN: 1940/09/04 (82 y.o. Dianna Limbo Primary Care Provider: Seabron Spates Other Clinician: Referring Provider: Treating Provider/Extender: Verner Mould in Treatment: 4 Verbal / Phone Orders: No Diagnosis Coding ICD-10 Coding Code Description [phone] Non-pressure chronic ulcer of other part of right lower leg with fat layer exposed L97.822 Non-pressure chronic ulcer of other part of left lower leg with fat layer exposed E11.622 Type 2 diabetes mellitus with other skin ulcer I50.42 Chronic combined systolic (congestive) and diastolic (congestive) heart failure N18.32 Chronic kidney disease, stage 3b R60.0 Localized edema KAVEH, KISSINGER ([id number]) [id number] Page 5 of 12 Follow-up Appointments ppointment in 1 week. - Dr. Lady Gary 04/08/23 at 10:15am Return A ppointment in 2 weeks. - Please ask front desk to schedule an appointment Return A Return appointment in 3 weeks. - Please ask front desk to schedule an appointment Return appointment in 1 month. - Please ask front desk to schedule an appointment Anesthetic (In clinic) Topical Lidocaine 5% applied to wound bed Bathing/ Shower/ Hygiene May shower and wash wound with soap and water. - Avoid getting moisture on wounds-the wraps on the Right leg Edema Control - Lymphedema / SCD / Other Bilateral Lower Extremities Elevate legs to the level of the heart or above for 30 minutes daily and/or when sitting for 3-4 times a day throughout the day. Avoid standing for long periods of time. Exercise regularly - As T olerated Moisturize legs daily. Home Health Discontinue home health for wound care. - Discontinue Home health -[facility] Wound Treatment Wound #15 - Lower Leg Wound Laterality: Right, Medial Cleanser: Soap and Water 1 x Per Week/30 Days Discharge Instructions: May shower and wash wound with dial antibacterial soap and water  prior to dressing change. Cleanser: Vashe 5.8 (oz) 1 x Per Week/30 Days Discharge Instructions: Cleanse the wound with Vashe prior to applying a clean dressing using gauze sponges, not tissue or cotton balls. Cleanser: Wound Cleanser 1 x Per Week/30 Days Discharge Instructions: Cleanse the wound with wound cleanser prior to applying a clean dressing using gauze sponges,  Shawn Stall on 03/31/2023 16:43:16 -------------------------------------------------------------------------------- HxROS Details Patient Name: Date of Service: Billey Chang 03/31/2023 10:15 A M Medical Record Number: [id number] Patient Account Number: [id number] Date of Birth/Sex: Treating RN: [birth year] (82 y.o. M) Primary Care Provider: Seabron Spates Other Clinician: Referring Provider: Treating Provider/Extender: Verner Mould in Treatment: 4 Information Obtained From Patient Eyes Medical History: Positive for: Cataracts - Surgery Hematologic/Lymphatic Medical History: Positive for: Anemia Respiratory Medical History: Positive for: Sleep Apnea Cardiovascular Medical History: Positive for: Arrhythmia; Congestive Heart Failure; Coronary Artery Disease; Hypertension Endocrine Medical History: JONG, RICKMAN ([id number]) [id number] Page 11 of 12 Positive for: Type II Diabetes Time with diabetes: 21 years Treated with: Oral agents Blood sugar tested every day: Yes Tested : Q am Blood sugar testing results: Breakfast: 202 Musculoskeletal Medical History: Positive for: Osteoarthritis Past Medical History Notes: Spinal Stenosis Neurologic Medical History: Positive for: Neuropathy Oncologic Medical History: Positive for: Received Chemotherapy Negative for: Received Radiation Past Medical History Notes: 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 HBO Extended History Items Eyes: Cataracts Immunizations Pneumococcal Vaccine: Received Pneumococcal Vaccination: Yes Received Pneumococcal Vaccination On or After 60th Birthday: No Implantable Devices Yes Hospitalization / Surgery History Type of Hospitalization/Surgery cellulitis right leg 10/25-10/28/2023 balloon angioplasty  03/2021 12/29/2021 CHF Family and Social History Cancer: Yes - Mother; Diabetes: Yes - Maternal Grandparents; Heart Disease: Yes - Paternal Grandparents; Hereditary Spherocytosis: No; Hypertension: Yes - Paternal Grandparents; Kidney Disease: No; Lung Disease: No; Seizures: Yes - Child; Stroke: Yes - Father; Thyroid Problems: No; Tuberculosis: No; Never smoker; Marital Status - Divorced; Alcohol Use: Never; Drug Use: No History; Caffeine Use: Rarely; Financial Concerns: No; Food, Clothing or Shelter Needs: No; Support System Lacking: No; Transportation Concerns: No Psychologist, prison and probation services) Signed: 03/31/2023 1:01:20 PM By: Duanne Guess MD FACS Entered By: Duanne Guess on 03/31/2023 10:57:28 -------------------------------------------------------------------------------- SuperBill Details Patient Name: Date of Service: [name] 03/31/2023 Medical Record Number: [id number] Patient Account Number: [id number] Date of Birth/Sex: Treating RN: Dec 27, 1940 (82 y.o. M) Primary Care Provider: Seabron Spates Other Clinician: Referring Provider: Treating Provider/Extender: Verner Mould in Treatment: 4 Diagnosis Coding KENLY, XIAO ([id number]) [id number] Page 12 of 12 ICD-10 Codes Code Description [phone] Non-pressure chronic ulcer of other part of right lower leg with fat layer exposed L97.822 Non-pressure chronic ulcer of other part of left lower leg with fat layer exposed E11.622 Type 2 diabetes mellitus with other skin ulcer I50.42 Chronic combined systolic (congestive) and diastolic (congestive) heart failure N18.32 Chronic kidney disease, stage 3b R60.0 Localized edema Facility Procedures : CPT4 Code: [id number] Description: 02725 - DEBRIDE WOUND 1ST 20 SQ CM OR < ICD-10 Diagnosis Description L97.812 Non-pressure chronic ulcer of other part of right lower leg with fat layer expos L97.822 Non-pressure chronic ulcer  of other part of left lower leg with fat layer  expose Modifier: ed d Quantity: 1 Physician Procedures : CPT4 Code Description Modifier [id number] - WC PHYS LEVEL 3 - EST PT 25 ICD-10 Diagnosis Description L97.812 Non-pressure chronic ulcer of other part of right lower leg with fat layer exposed L97.822 Non-pressure chronic ulcer of other part of left  lower leg with fat layer exposed E11.622 Type 2 diabetes mellitus with other skin ulcer I50.42 Chronic combined systolic (congestive) and diastolic (congestive) heart failure Quantity: 1 : [id number] - WC PHYS DEBR WO ANESTH 20 SQ CM ICD-10 Diagnosis Description L97.812 Non-pressure chronic ulcer of other part of right lower leg with fat layer  RAEQUAN, VANSCHAICK ([id number]) [id number] Page 1 of 12 Visit Report for 03/31/2023 Chief Complaint Document Details Patient Name: Date of Service: [name] 03/31/2023 10:15 A M Medical Record Number: [id number] Patient Account Number: [id number] Date of Birth/Sex: Treating RN: 1941-07-01 (82 y.o. M) Primary Care Provider: Seabron Spates Other Clinician: Referring Provider: Treating Provider/Extender: Verner Mould in Treatment: 4 Information Obtained from: Patient Chief Complaint 01/12/2021; Wounds to his left upper extremity and bilateral lower extremities. 05/14/2022; bilateral lower extremity wounds, right plantar foot wound 09/05/2022; bilateral lower extremity wounds, left thumb wound 02/25/2023: left foot DFU and right knee and RLE wounds Electronic Signature(s) Signed: 03/31/2023 10:56:23 AM By: Duanne Guess MD FACS Entered By: Duanne Guess on 03/31/2023 10:56:23 -------------------------------------------------------------------------------- Debridement Details Patient Name: Date of Service: [name] 03/31/2023 10:15 A M Medical Record Number: [id number] Patient Account Number: [id number] Date of Birth/Sex: Treating RN: 08-03-1940 (82 y.o. Dianna Limbo Primary Care Provider: Seabron Spates Other Clinician: Referring Provider: Treating Provider/Extender: Verner Mould in Treatment: 4 Debridement Performed for Assessment: Wound #15 Right,Medial Lower Leg Performed By: Physician Duanne Guess, MD The following information was scribed by: Karie Schwalbe The information was scribed for: Duanne Guess Debridement Type: Debridement Level of Consciousness (Pre-procedure): Awake and Alert Pre-procedure Verification/Time Out Yes - 10:50 Taken: Start Time: 10:50 Pain Control: Lidocaine 4% T opical Solution Percent of Wound Bed Debrided: 100% T Area  Debrided (cm): otal 0.16 Tissue and other material debrided: Non-Viable, Slough, Slough Level: Non-Viable Tissue Debridement Description: Selective/Open Wound Instrument: Curette Bleeding: Minimum Hemostasis Achieved: Pressure End Time: 10:53 Procedural Pain: 0 Post Procedural Pain: 0 Response to Treatment: Procedure was tolerated well Level of Consciousness (Post- Awake and Alert procedure): Post Debridement Measurements of Total Wound Length: (cm) 0.4 YSMAEL, HIRES ([id number]) [id number] Page 2 of 12 Width: (cm) 0.5 Depth: (cm) 0.1 Volume: (cm) 0.016 Character of Wound/Ulcer Post Debridement: Improved Post Procedure Diagnosis Same as Pre-procedure Electronic Signature(s) Signed: 03/31/2023 1:01:20 PM By: Duanne Guess MD FACS Signed: 03/31/2023 3:51:35 PM By: Karie Schwalbe RN Entered By: Karie Schwalbe on 03/31/2023 10:55:01 -------------------------------------------------------------------------------- Debridement Details Patient Name: Date of Service: [name] 03/31/2023 10:15 A M Medical Record Number: [id number] Patient Account Number: [id number] Date of Birth/Sex: Treating RN: 1940-08-29 (82 y.o. Dianna Limbo Primary Care Provider: Seabron Spates Other Clinician: Referring Provider: Treating Provider/Extender: Verner Mould in Treatment: 4 Debridement Performed for Assessment: Wound #16 Left,Anterior Lower Leg Performed By: Physician Duanne Guess, MD The following information was scribed by: Karie Schwalbe The information was scribed for: Duanne Guess Debridement Type: Debridement Level of Consciousness (Pre-procedure): Awake and Alert Pre-procedure Verification/Time Out Yes - 10:50 Taken: Start Time: 10:50 Pain Control: Lidocaine 4% Topical Solution Percent of Wound Bed Debrided: 100% T Area Debrided (cm): otal 2.36 Tissue and other material debrided: Non-Viable,  Eschar, Slough, Slough Level: Non-Viable Tissue Debridement Description: Selective/Open Wound Instrument: Curette Bleeding: Minimum Hemostasis Achieved: Pressure End Time: 10:53 Procedural Pain: 0 Post Procedural Pain: 0 Response to Treatment: Procedure was tolerated well Level of Consciousness (Post- Awake and Alert procedure): Post Debridement Measurements of Total Wound Length: (cm) 2 Width: (cm) 1.5 Depth: (cm) 0.1 Volume: (cm) 0.236 Character of Wound/Ulcer Post Debridement: Improved Post Procedure Diagnosis Same as Pre-procedure Electronic Signature(s) Signed: 03/31/2023 1:01:20 PM By: Duanne Guess MD FACS Signed: 03/31/2023 3:51:35 PM By: Karie Schwalbe RN Entered By: Karie Schwalbe on 03/31/2023 10:56:14 [name] ([id number]) [id number] Page  Shawn Stall on 03/31/2023 16:43:16 -------------------------------------------------------------------------------- HxROS Details Patient Name: Date of Service: Billey Chang 03/31/2023 10:15 A M Medical Record Number: [id number] Patient Account Number: [id number] Date of Birth/Sex: Treating RN: [birth year] (82 y.o. M) Primary Care Provider: Seabron Spates Other Clinician: Referring Provider: Treating Provider/Extender: Verner Mould in Treatment: 4 Information Obtained From Patient Eyes Medical History: Positive for: Cataracts - Surgery Hematologic/Lymphatic Medical History: Positive for: Anemia Respiratory Medical History: Positive for: Sleep Apnea Cardiovascular Medical History: Positive for: Arrhythmia; Congestive Heart Failure; Coronary Artery Disease; Hypertension Endocrine Medical History: JONG, RICKMAN ([id number]) [id number] Page 11 of 12 Positive for: Type II Diabetes Time with diabetes: 21 years Treated with: Oral agents Blood sugar tested every day: Yes Tested : Q am Blood sugar testing results: Breakfast: 202 Musculoskeletal Medical History: Positive for: Osteoarthritis Past Medical History Notes: Spinal Stenosis Neurologic Medical History: Positive for: Neuropathy Oncologic Medical History: Positive for: Received Chemotherapy Negative for: Received Radiation Past Medical History Notes: 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 HBO Extended History Items Eyes: Cataracts Immunizations Pneumococcal Vaccine: Received Pneumococcal Vaccination: Yes Received Pneumococcal Vaccination On or After 60th Birthday: No Implantable Devices Yes Hospitalization / Surgery History Type of Hospitalization/Surgery cellulitis right leg 10/25-10/28/2023 balloon angioplasty  03/2021 12/29/2021 CHF Family and Social History Cancer: Yes - Mother; Diabetes: Yes - Maternal Grandparents; Heart Disease: Yes - Paternal Grandparents; Hereditary Spherocytosis: No; Hypertension: Yes - Paternal Grandparents; Kidney Disease: No; Lung Disease: No; Seizures: Yes - Child; Stroke: Yes - Father; Thyroid Problems: No; Tuberculosis: No; Never smoker; Marital Status - Divorced; Alcohol Use: Never; Drug Use: No History; Caffeine Use: Rarely; Financial Concerns: No; Food, Clothing or Shelter Needs: No; Support System Lacking: No; Transportation Concerns: No Psychologist, prison and probation services) Signed: 03/31/2023 1:01:20 PM By: Duanne Guess MD FACS Entered By: Duanne Guess on 03/31/2023 10:57:28 -------------------------------------------------------------------------------- SuperBill Details Patient Name: Date of Service: [name] 03/31/2023 Medical Record Number: [id number] Patient Account Number: [id number] Date of Birth/Sex: Treating RN: Dec 27, 1940 (82 y.o. M) Primary Care Provider: Seabron Spates Other Clinician: Referring Provider: Treating Provider/Extender: Verner Mould in Treatment: 4 Diagnosis Coding KENLY, XIAO ([id number]) [id number] Page 12 of 12 ICD-10 Codes Code Description [phone] Non-pressure chronic ulcer of other part of right lower leg with fat layer exposed L97.822 Non-pressure chronic ulcer of other part of left lower leg with fat layer exposed E11.622 Type 2 diabetes mellitus with other skin ulcer I50.42 Chronic combined systolic (congestive) and diastolic (congestive) heart failure N18.32 Chronic kidney disease, stage 3b R60.0 Localized edema Facility Procedures : CPT4 Code: [id number] Description: 02725 - DEBRIDE WOUND 1ST 20 SQ CM OR < ICD-10 Diagnosis Description L97.812 Non-pressure chronic ulcer of other part of right lower leg with fat layer expos L97.822 Non-pressure chronic ulcer  of other part of left lower leg with fat layer  expose Modifier: ed d Quantity: 1 Physician Procedures : CPT4 Code Description Modifier [id number] - WC PHYS LEVEL 3 - EST PT 25 ICD-10 Diagnosis Description L97.812 Non-pressure chronic ulcer of other part of right lower leg with fat layer exposed L97.822 Non-pressure chronic ulcer of other part of left  lower leg with fat layer exposed E11.622 Type 2 diabetes mellitus with other skin ulcer I50.42 Chronic combined systolic (congestive) and diastolic (congestive) heart failure Quantity: 1 : [id number] 97597 - WC PHYS DEBR WO ANESTH 20 SQ CM ICD-10 Diagnosis Description L97.812 Non-pressure chronic ulcer of other part of right lower leg with fat layer  Shawn Stall on 03/31/2023 16:43:16 -------------------------------------------------------------------------------- HxROS Details Patient Name: Date of Service: Billey Chang 03/31/2023 10:15 A M Medical Record Number: [id number] Patient Account Number: [id number] Date of Birth/Sex: Treating RN: [birth year] (82 y.o. M) Primary Care Provider: Seabron Spates Other Clinician: Referring Provider: Treating Provider/Extender: Verner Mould in Treatment: 4 Information Obtained From Patient Eyes Medical History: Positive for: Cataracts - Surgery Hematologic/Lymphatic Medical History: Positive for: Anemia Respiratory Medical History: Positive for: Sleep Apnea Cardiovascular Medical History: Positive for: Arrhythmia; Congestive Heart Failure; Coronary Artery Disease; Hypertension Endocrine Medical History: JONG, RICKMAN ([id number]) [id number] Page 11 of 12 Positive for: Type II Diabetes Time with diabetes: 21 years Treated with: Oral agents Blood sugar tested every day: Yes Tested : Q am Blood sugar testing results: Breakfast: 202 Musculoskeletal Medical History: Positive for: Osteoarthritis Past Medical History Notes: Spinal Stenosis Neurologic Medical History: Positive for: Neuropathy Oncologic Medical History: Positive for: Received Chemotherapy Negative for: Received Radiation Past Medical History Notes: 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 HBO Extended History Items Eyes: Cataracts Immunizations Pneumococcal Vaccine: Received Pneumococcal Vaccination: Yes Received Pneumococcal Vaccination On or After 60th Birthday: No Implantable Devices Yes Hospitalization / Surgery History Type of Hospitalization/Surgery cellulitis right leg 10/25-10/28/2023 balloon angioplasty  03/2021 12/29/2021 CHF Family and Social History Cancer: Yes - Mother; Diabetes: Yes - Maternal Grandparents; Heart Disease: Yes - Paternal Grandparents; Hereditary Spherocytosis: No; Hypertension: Yes - Paternal Grandparents; Kidney Disease: No; Lung Disease: No; Seizures: Yes - Child; Stroke: Yes - Father; Thyroid Problems: No; Tuberculosis: No; Never smoker; Marital Status - Divorced; Alcohol Use: Never; Drug Use: No History; Caffeine Use: Rarely; Financial Concerns: No; Food, Clothing or Shelter Needs: No; Support System Lacking: No; Transportation Concerns: No Psychologist, prison and probation services) Signed: 03/31/2023 1:01:20 PM By: Duanne Guess MD FACS Entered By: Duanne Guess on 03/31/2023 10:57:28 -------------------------------------------------------------------------------- SuperBill Details Patient Name: Date of Service: [name] 03/31/2023 Medical Record Number: [id number] Patient Account Number: [id number] Date of Birth/Sex: Treating RN: Dec 27, 1940 (82 y.o. M) Primary Care Provider: Seabron Spates Other Clinician: Referring Provider: Treating Provider/Extender: Verner Mould in Treatment: 4 Diagnosis Coding KENLY, XIAO ([id number]) [id number] Page 12 of 12 ICD-10 Codes Code Description [phone] Non-pressure chronic ulcer of other part of right lower leg with fat layer exposed L97.822 Non-pressure chronic ulcer of other part of left lower leg with fat layer exposed E11.622 Type 2 diabetes mellitus with other skin ulcer I50.42 Chronic combined systolic (congestive) and diastolic (congestive) heart failure N18.32 Chronic kidney disease, stage 3b R60.0 Localized edema Facility Procedures : CPT4 Code: [id number] Description: 02725 - DEBRIDE WOUND 1ST 20 SQ CM OR < ICD-10 Diagnosis Description L97.812 Non-pressure chronic ulcer of other part of right lower leg with fat layer expos L97.822 Non-pressure chronic ulcer  of other part of left lower leg with fat layer  expose Modifier: ed d Quantity: 1 Physician Procedures : CPT4 Code Description Modifier [id number] - WC PHYS LEVEL 3 - EST PT 25 ICD-10 Diagnosis Description L97.812 Non-pressure chronic ulcer of other part of right lower leg with fat layer exposed L97.822 Non-pressure chronic ulcer of other part of left  lower leg with fat layer exposed E11.622 Type 2 diabetes mellitus with other skin ulcer I50.42 Chronic combined systolic (congestive) and diastolic (congestive) heart failure Quantity: 1 : [id number] - WC PHYS DEBR WO ANESTH 20 SQ CM ICD-10 Diagnosis Description L97.812 Non-pressure chronic ulcer of other part of right lower leg with fat layer  RAEQUAN, VANSCHAICK ([id number]) [id number] Page 1 of 12 Visit Report for 03/31/2023 Chief Complaint Document Details Patient Name: Date of Service: [name] 03/31/2023 10:15 A M Medical Record Number: [id number] Patient Account Number: [id number] Date of Birth/Sex: Treating RN: 1941-07-01 (82 y.o. M) Primary Care Provider: Seabron Spates Other Clinician: Referring Provider: Treating Provider/Extender: Verner Mould in Treatment: 4 Information Obtained from: Patient Chief Complaint 01/12/2021; Wounds to his left upper extremity and bilateral lower extremities. 05/14/2022; bilateral lower extremity wounds, right plantar foot wound 09/05/2022; bilateral lower extremity wounds, left thumb wound 02/25/2023: left foot DFU and right knee and RLE wounds Electronic Signature(s) Signed: 03/31/2023 10:56:23 AM By: Duanne Guess MD FACS Entered By: Duanne Guess on 03/31/2023 10:56:23 -------------------------------------------------------------------------------- Debridement Details Patient Name: Date of Service: [name] 03/31/2023 10:15 A M Medical Record Number: [id number] Patient Account Number: [id number] Date of Birth/Sex: Treating RN: 08-03-1940 (82 y.o. Dianna Limbo Primary Care Provider: Seabron Spates Other Clinician: Referring Provider: Treating Provider/Extender: Verner Mould in Treatment: 4 Debridement Performed for Assessment: Wound #15 Right,Medial Lower Leg Performed By: Physician Duanne Guess, MD The following information was scribed by: Karie Schwalbe The information was scribed for: Duanne Guess Debridement Type: Debridement Level of Consciousness (Pre-procedure): Awake and Alert Pre-procedure Verification/Time Out Yes - 10:50 Taken: Start Time: 10:50 Pain Control: Lidocaine 4% T opical Solution Percent of Wound Bed Debrided: 100% T Area  Debrided (cm): otal 0.16 Tissue and other material debrided: Non-Viable, Slough, Slough Level: Non-Viable Tissue Debridement Description: Selective/Open Wound Instrument: Curette Bleeding: Minimum Hemostasis Achieved: Pressure End Time: 10:53 Procedural Pain: 0 Post Procedural Pain: 0 Response to Treatment: Procedure was tolerated well Level of Consciousness (Post- Awake and Alert procedure): Post Debridement Measurements of Total Wound Length: (cm) 0.4 YSMAEL, HIRES ([id number]) [id number] Page 2 of 12 Width: (cm) 0.5 Depth: (cm) 0.1 Volume: (cm) 0.016 Character of Wound/Ulcer Post Debridement: Improved Post Procedure Diagnosis Same as Pre-procedure Electronic Signature(s) Signed: 03/31/2023 1:01:20 PM By: Duanne Guess MD FACS Signed: 03/31/2023 3:51:35 PM By: Karie Schwalbe RN Entered By: Karie Schwalbe on 03/31/2023 10:55:01 -------------------------------------------------------------------------------- Debridement Details Patient Name: Date of Service: [name] 03/31/2023 10:15 A M Medical Record Number: [id number] Patient Account Number: [id number] Date of Birth/Sex: Treating RN: 1940-08-29 (82 y.o. Dianna Limbo Primary Care Provider: Seabron Spates Other Clinician: Referring Provider: Treating Provider/Extender: Verner Mould in Treatment: 4 Debridement Performed for Assessment: Wound #16 Left,Anterior Lower Leg Performed By: Physician Duanne Guess, MD The following information was scribed by: Karie Schwalbe The information was scribed for: Duanne Guess Debridement Type: Debridement Level of Consciousness (Pre-procedure): Awake and Alert Pre-procedure Verification/Time Out Yes - 10:50 Taken: Start Time: 10:50 Pain Control: Lidocaine 4% Topical Solution Percent of Wound Bed Debrided: 100% T Area Debrided (cm): otal 2.36 Tissue and other material debrided: Non-Viable,  Eschar, Slough, Slough Level: Non-Viable Tissue Debridement Description: Selective/Open Wound Instrument: Curette Bleeding: Minimum Hemostasis Achieved: Pressure End Time: 10:53 Procedural Pain: 0 Post Procedural Pain: 0 Response to Treatment: Procedure was tolerated well Level of Consciousness (Post- Awake and Alert procedure): Post Debridement Measurements of Total Wound Length: (cm) 2 Width: (cm) 1.5 Depth: (cm) 0.1 Volume: (cm) 0.236 Character of Wound/Ulcer Post Debridement: Improved Post Procedure Diagnosis Same as Pre-procedure Electronic Signature(s) Signed: 03/31/2023 1:01:20 PM By: Duanne Guess MD FACS Signed: 03/31/2023 3:51:35 PM By: Karie Schwalbe RN Entered By: Karie Schwalbe on 03/31/2023 10:56:14 [name] ([id number]) [id number] Page  3 of 12 -------------------------------------------------------------------------------- HPI Details Patient Name: Date of Service: [name] 03/31/2023 10:15 A M Medical Record Number: [id number] Patient Account Number: [id number] Date of Birth/Sex: Treating RN: Aug 02, 1940 (82 y.o. M) Primary Care Provider: Seabron Spates Other Clinician: Referring Provider: Treating Provider/Extender: Verner Mould in Treatment: 4 History of Present Illness HPI Description: Admission 7/15 Mr. Antaeus Eline is an 82 year old male with a past medical history of type 2 diabetes on oral agents, diffuse large B-cell lymphoma, and coronary artery disease status post CABG that presents to the clinic for wounds to his left upper extremity and bilateral lower extremities. He states that he went to urgent care to be evaluated for a fever. And he was instructed to go to the ED to be further evaluated for possible sepsis. Unfortunately he passed out in his car After his appointment and was not discovered for another 3 hours. He was eventually discovered and EMS had to take him  out of his vehicle. This is when he developed abrasions to his left arm and legs. He has been using Xeroform to his wound beds every other day. He reports improvement to all the wounds except for the one on the back of his right leg. He denies pain or signs of infection. 7/22; patient presents for 1 week follow-up. He has no issues or complaints today. He has tolerated the compression wrap well on his right lower extremity. He has been able to do the dressing changes without issues to his left leg and left arm. His left arm wound has healed. He denies signs of infection. 7/29; patient presents for 1 week follow-up. He reports that his left leg wound is healed. He tolerated the compression wrap well on his right leg. He has no issues or complaints today. He denies signs of infection. 8/4; patient presents for 1 week follow-up. He continues to see improvement in wound healing to his right lower extremity. He has no issues or complaints today. He denies signs of infection. He does state that he went to the dermatologist and had some lesions removed on his face and left leg. He has been keeping these covered with a Band-Aid. 8/19; patient presents for follow-up. He is tolerated the compression wrap well. He has no issues or complaints today. He now only has 1 remaining wound. 9/1; patient presents for 1 week follow-up. He has tolerated the compression wrap well and has no issues or complaints today. He has been golfing 3 times weekly. He is in good spirits today. 9/15; patient presents for follow-up. He has no issues or complaints today. The wound is closed. Readmission 05/14/2022 Mr. Jeramie Magid is an 82 year old male with a past medical history of uncontrolled type 2 diabetes on oral agents, diffuse large B-cell lymphoma and coronary artery disease status post CABG that presents the clinic for 3 wounds. He developed a right knee wound when he fell out of his golf cart 3 weeks ago. He developed  increased warmth and erythema to the right leg. While he was still waiting in the ED on 04/24/2022 for this issue he dropped his phone and created a wound to his left thigh. He has been using Xeroform to these areas. He ultimately required admission from the ED for right lower extremity cellulitis and was treated with Zyvox and Keflex. He also has a right plantar foot wound that started after having callus debridement by his podiatrist. He has had this wound for at least 6 weeks. For  RAEQUAN, VANSCHAICK ([id number]) [id number] Page 1 of 12 Visit Report for 03/31/2023 Chief Complaint Document Details Patient Name: Date of Service: [name] 03/31/2023 10:15 A M Medical Record Number: [id number] Patient Account Number: [id number] Date of Birth/Sex: Treating RN: 1941-07-01 (82 y.o. M) Primary Care Provider: Seabron Spates Other Clinician: Referring Provider: Treating Provider/Extender: Verner Mould in Treatment: 4 Information Obtained from: Patient Chief Complaint 01/12/2021; Wounds to his left upper extremity and bilateral lower extremities. 05/14/2022; bilateral lower extremity wounds, right plantar foot wound 09/05/2022; bilateral lower extremity wounds, left thumb wound 02/25/2023: left foot DFU and right knee and RLE wounds Electronic Signature(s) Signed: 03/31/2023 10:56:23 AM By: Duanne Guess MD FACS Entered By: Duanne Guess on 03/31/2023 10:56:23 -------------------------------------------------------------------------------- Debridement Details Patient Name: Date of Service: [name] 03/31/2023 10:15 A M Medical Record Number: [id number] Patient Account Number: [id number] Date of Birth/Sex: Treating RN: 08-03-1940 (82 y.o. Dianna Limbo Primary Care Provider: Seabron Spates Other Clinician: Referring Provider: Treating Provider/Extender: Verner Mould in Treatment: 4 Debridement Performed for Assessment: Wound #15 Right,Medial Lower Leg Performed By: Physician Duanne Guess, MD The following information was scribed by: Karie Schwalbe The information was scribed for: Duanne Guess Debridement Type: Debridement Level of Consciousness (Pre-procedure): Awake and Alert Pre-procedure Verification/Time Out Yes - 10:50 Taken: Start Time: 10:50 Pain Control: Lidocaine 4% T opical Solution Percent of Wound Bed Debrided: 100% T Area  Debrided (cm): otal 0.16 Tissue and other material debrided: Non-Viable, Slough, Slough Level: Non-Viable Tissue Debridement Description: Selective/Open Wound Instrument: Curette Bleeding: Minimum Hemostasis Achieved: Pressure End Time: 10:53 Procedural Pain: 0 Post Procedural Pain: 0 Response to Treatment: Procedure was tolerated well Level of Consciousness (Post- Awake and Alert procedure): Post Debridement Measurements of Total Wound Length: (cm) 0.4 YSMAEL, HIRES ([id number]) [id number] Page 2 of 12 Width: (cm) 0.5 Depth: (cm) 0.1 Volume: (cm) 0.016 Character of Wound/Ulcer Post Debridement: Improved Post Procedure Diagnosis Same as Pre-procedure Electronic Signature(s) Signed: 03/31/2023 1:01:20 PM By: Duanne Guess MD FACS Signed: 03/31/2023 3:51:35 PM By: Karie Schwalbe RN Entered By: Karie Schwalbe on 03/31/2023 10:55:01 -------------------------------------------------------------------------------- Debridement Details Patient Name: Date of Service: [name] 03/31/2023 10:15 A M Medical Record Number: [id number] Patient Account Number: [id number] Date of Birth/Sex: Treating RN: 1940-08-29 (82 y.o. Dianna Limbo Primary Care Provider: Seabron Spates Other Clinician: Referring Provider: Treating Provider/Extender: Verner Mould in Treatment: 4 Debridement Performed for Assessment: Wound #16 Left,Anterior Lower Leg Performed By: Physician Duanne Guess, MD The following information was scribed by: Karie Schwalbe The information was scribed for: Duanne Guess Debridement Type: Debridement Level of Consciousness (Pre-procedure): Awake and Alert Pre-procedure Verification/Time Out Yes - 10:50 Taken: Start Time: 10:50 Pain Control: Lidocaine 4% Topical Solution Percent of Wound Bed Debrided: 100% T Area Debrided (cm): otal 2.36 Tissue and other material debrided: Non-Viable,  Eschar, Slough, Slough Level: Non-Viable Tissue Debridement Description: Selective/Open Wound Instrument: Curette Bleeding: Minimum Hemostasis Achieved: Pressure End Time: 10:53 Procedural Pain: 0 Post Procedural Pain: 0 Response to Treatment: Procedure was tolerated well Level of Consciousness (Post- Awake and Alert procedure): Post Debridement Measurements of Total Wound Length: (cm) 2 Width: (cm) 1.5 Depth: (cm) 0.1 Volume: (cm) 0.236 Character of Wound/Ulcer Post Debridement: Improved Post Procedure Diagnosis Same as Pre-procedure Electronic Signature(s) Signed: 03/31/2023 1:01:20 PM By: Duanne Guess MD FACS Signed: 03/31/2023 3:51:35 PM By: Karie Schwalbe RN Entered By: Karie Schwalbe on 03/31/2023 10:56:14 [name] ([id number]) [id number] Page  3 of 12 -------------------------------------------------------------------------------- HPI Details Patient Name: Date of Service: [name] 03/31/2023 10:15 A M Medical Record Number: [id number] Patient Account Number: [id number] Date of Birth/Sex: Treating RN: Aug 02, 1940 (82 y.o. M) Primary Care Provider: Seabron Spates Other Clinician: Referring Provider: Treating Provider/Extender: Verner Mould in Treatment: 4 History of Present Illness HPI Description: Admission 7/15 Mr. Antaeus Eline is an 82 year old male with a past medical history of type 2 diabetes on oral agents, diffuse large B-cell lymphoma, and coronary artery disease status post CABG that presents to the clinic for wounds to his left upper extremity and bilateral lower extremities. He states that he went to urgent care to be evaluated for a fever. And he was instructed to go to the ED to be further evaluated for possible sepsis. Unfortunately he passed out in his car After his appointment and was not discovered for another 3 hours. He was eventually discovered and EMS had to take him  out of his vehicle. This is when he developed abrasions to his left arm and legs. He has been using Xeroform to his wound beds every other day. He reports improvement to all the wounds except for the one on the back of his right leg. He denies pain or signs of infection. 7/22; patient presents for 1 week follow-up. He has no issues or complaints today. He has tolerated the compression wrap well on his right lower extremity. He has been able to do the dressing changes without issues to his left leg and left arm. His left arm wound has healed. He denies signs of infection. 7/29; patient presents for 1 week follow-up. He reports that his left leg wound is healed. He tolerated the compression wrap well on his right leg. He has no issues or complaints today. He denies signs of infection. 8/4; patient presents for 1 week follow-up. He continues to see improvement in wound healing to his right lower extremity. He has no issues or complaints today. He denies signs of infection. He does state that he went to the dermatologist and had some lesions removed on his face and left leg. He has been keeping these covered with a Band-Aid. 8/19; patient presents for follow-up. He is tolerated the compression wrap well. He has no issues or complaints today. He now only has 1 remaining wound. 9/1; patient presents for 1 week follow-up. He has tolerated the compression wrap well and has no issues or complaints today. He has been golfing 3 times weekly. He is in good spirits today. 9/15; patient presents for follow-up. He has no issues or complaints today. The wound is closed. Readmission 05/14/2022 Mr. Jeramie Magid is an 82 year old male with a past medical history of uncontrolled type 2 diabetes on oral agents, diffuse large B-cell lymphoma and coronary artery disease status post CABG that presents the clinic for 3 wounds. He developed a right knee wound when he fell out of his golf cart 3 weeks ago. He developed  increased warmth and erythema to the right leg. While he was still waiting in the ED on 04/24/2022 for this issue he dropped his phone and created a wound to his left thigh. He has been using Xeroform to these areas. He ultimately required admission from the ED for right lower extremity cellulitis and was treated with Zyvox and Keflex. He also has a right plantar foot wound that started after having callus debridement by his podiatrist. He has had this wound for at least 6 weeks. For  3 of 12 -------------------------------------------------------------------------------- HPI Details Patient Name: Date of Service: [name] 03/31/2023 10:15 A M Medical Record Number: [id number] Patient Account Number: [id number] Date of Birth/Sex: Treating RN: Aug 02, 1940 (82 y.o. M) Primary Care Provider: Seabron Spates Other Clinician: Referring Provider: Treating Provider/Extender: Verner Mould in Treatment: 4 History of Present Illness HPI Description: Admission 7/15 Mr. Antaeus Eline is an 82 year old male with a past medical history of type 2 diabetes on oral agents, diffuse large B-cell lymphoma, and coronary artery disease status post CABG that presents to the clinic for wounds to his left upper extremity and bilateral lower extremities. He states that he went to urgent care to be evaluated for a fever. And he was instructed to go to the ED to be further evaluated for possible sepsis. Unfortunately he passed out in his car After his appointment and was not discovered for another 3 hours. He was eventually discovered and EMS had to take him  out of his vehicle. This is when he developed abrasions to his left arm and legs. He has been using Xeroform to his wound beds every other day. He reports improvement to all the wounds except for the one on the back of his right leg. He denies pain or signs of infection. 7/22; patient presents for 1 week follow-up. He has no issues or complaints today. He has tolerated the compression wrap well on his right lower extremity. He has been able to do the dressing changes without issues to his left leg and left arm. His left arm wound has healed. He denies signs of infection. 7/29; patient presents for 1 week follow-up. He reports that his left leg wound is healed. He tolerated the compression wrap well on his right leg. He has no issues or complaints today. He denies signs of infection. 8/4; patient presents for 1 week follow-up. He continues to see improvement in wound healing to his right lower extremity. He has no issues or complaints today. He denies signs of infection. He does state that he went to the dermatologist and had some lesions removed on his face and left leg. He has been keeping these covered with a Band-Aid. 8/19; patient presents for follow-up. He is tolerated the compression wrap well. He has no issues or complaints today. He now only has 1 remaining wound. 9/1; patient presents for 1 week follow-up. He has tolerated the compression wrap well and has no issues or complaints today. He has been golfing 3 times weekly. He is in good spirits today. 9/15; patient presents for follow-up. He has no issues or complaints today. The wound is closed. Readmission 05/14/2022 Mr. Jeramie Magid is an 82 year old male with a past medical history of uncontrolled type 2 diabetes on oral agents, diffuse large B-cell lymphoma and coronary artery disease status post CABG that presents the clinic for 3 wounds. He developed a right knee wound when he fell out of his golf cart 3 weeks ago. He developed  increased warmth and erythema to the right leg. While he was still waiting in the ED on 04/24/2022 for this issue he dropped his phone and created a wound to his left thigh. He has been using Xeroform to these areas. He ultimately required admission from the ED for right lower extremity cellulitis and was treated with Zyvox and Keflex. He also has a right plantar foot wound that started after having callus debridement by his podiatrist. He has had this wound for at least 6 weeks. For  3 of 12 -------------------------------------------------------------------------------- HPI Details Patient Name: Date of Service: [name] 03/31/2023 10:15 A M Medical Record Number: [id number] Patient Account Number: [id number] Date of Birth/Sex: Treating RN: Aug 02, 1940 (82 y.o. M) Primary Care Provider: Seabron Spates Other Clinician: Referring Provider: Treating Provider/Extender: Verner Mould in Treatment: 4 History of Present Illness HPI Description: Admission 7/15 Mr. Antaeus Eline is an 82 year old male with a past medical history of type 2 diabetes on oral agents, diffuse large B-cell lymphoma, and coronary artery disease status post CABG that presents to the clinic for wounds to his left upper extremity and bilateral lower extremities. He states that he went to urgent care to be evaluated for a fever. And he was instructed to go to the ED to be further evaluated for possible sepsis. Unfortunately he passed out in his car After his appointment and was not discovered for another 3 hours. He was eventually discovered and EMS had to take him  out of his vehicle. This is when he developed abrasions to his left arm and legs. He has been using Xeroform to his wound beds every other day. He reports improvement to all the wounds except for the one on the back of his right leg. He denies pain or signs of infection. 7/22; patient presents for 1 week follow-up. He has no issues or complaints today. He has tolerated the compression wrap well on his right lower extremity. He has been able to do the dressing changes without issues to his left leg and left arm. His left arm wound has healed. He denies signs of infection. 7/29; patient presents for 1 week follow-up. He reports that his left leg wound is healed. He tolerated the compression wrap well on his right leg. He has no issues or complaints today. He denies signs of infection. 8/4; patient presents for 1 week follow-up. He continues to see improvement in wound healing to his right lower extremity. He has no issues or complaints today. He denies signs of infection. He does state that he went to the dermatologist and had some lesions removed on his face and left leg. He has been keeping these covered with a Band-Aid. 8/19; patient presents for follow-up. He is tolerated the compression wrap well. He has no issues or complaints today. He now only has 1 remaining wound. 9/1; patient presents for 1 week follow-up. He has tolerated the compression wrap well and has no issues or complaints today. He has been golfing 3 times weekly. He is in good spirits today. 9/15; patient presents for follow-up. He has no issues or complaints today. The wound is closed. Readmission 05/14/2022 Mr. Jeramie Magid is an 82 year old male with a past medical history of uncontrolled type 2 diabetes on oral agents, diffuse large B-cell lymphoma and coronary artery disease status post CABG that presents the clinic for 3 wounds. He developed a right knee wound when he fell out of his golf cart 3 weeks ago. He developed  increased warmth and erythema to the right leg. While he was still waiting in the ED on 04/24/2022 for this issue he dropped his phone and created a wound to his left thigh. He has been using Xeroform to these areas. He ultimately required admission from the ED for right lower extremity cellulitis and was treated with Zyvox and Keflex. He also has a right plantar foot wound that started after having callus debridement by his podiatrist. He has had this wound for at least 6 weeks. For

## 2023-04-01 DIAGNOSIS — I2510 Atherosclerotic heart disease of native coronary artery without angina pectoris: Secondary | ICD-10-CM | POA: Diagnosis not present

## 2023-04-01 DIAGNOSIS — Z7901 Long term (current) use of anticoagulants: Secondary | ICD-10-CM | POA: Diagnosis not present

## 2023-04-01 DIAGNOSIS — I447 Left bundle-branch block, unspecified: Secondary | ICD-10-CM | POA: Diagnosis not present

## 2023-04-01 DIAGNOSIS — Z8572 Personal history of non-Hodgkin lymphomas: Secondary | ICD-10-CM | POA: Diagnosis not present

## 2023-04-01 DIAGNOSIS — I480 Paroxysmal atrial fibrillation: Secondary | ICD-10-CM | POA: Diagnosis not present

## 2023-04-01 DIAGNOSIS — I428 Other cardiomyopathies: Secondary | ICD-10-CM | POA: Diagnosis not present

## 2023-04-01 DIAGNOSIS — I10 Essential (primary) hypertension: Secondary | ICD-10-CM | POA: Diagnosis not present

## 2023-04-01 DIAGNOSIS — E119 Type 2 diabetes mellitus without complications: Secondary | ICD-10-CM | POA: Diagnosis not present

## 2023-04-01 DIAGNOSIS — Z7985 Long-term (current) use of injectable non-insulin antidiabetic drugs: Secondary | ICD-10-CM | POA: Diagnosis not present

## 2023-04-01 DIAGNOSIS — I442 Atrioventricular block, complete: Secondary | ICD-10-CM | POA: Diagnosis not present

## 2023-04-01 DIAGNOSIS — G4733 Obstructive sleep apnea (adult) (pediatric): Secondary | ICD-10-CM | POA: Diagnosis not present

## 2023-04-01 DIAGNOSIS — K219 Gastro-esophageal reflux disease without esophagitis: Secondary | ICD-10-CM | POA: Diagnosis not present

## 2023-04-01 DIAGNOSIS — R931 Abnormal findings on diagnostic imaging of heart and coronary circulation: Secondary | ICD-10-CM | POA: Diagnosis not present

## 2023-04-01 DIAGNOSIS — Z7984 Long term (current) use of oral hypoglycemic drugs: Secondary | ICD-10-CM | POA: Diagnosis not present

## 2023-04-01 DIAGNOSIS — Z888 Allergy status to other drugs, medicaments and biological substances status: Secondary | ICD-10-CM | POA: Diagnosis not present

## 2023-04-01 DIAGNOSIS — I252 Old myocardial infarction: Secondary | ICD-10-CM | POA: Diagnosis not present

## 2023-04-01 DIAGNOSIS — E785 Hyperlipidemia, unspecified: Secondary | ICD-10-CM | POA: Diagnosis not present

## 2023-04-01 DIAGNOSIS — Z951 Presence of aortocoronary bypass graft: Secondary | ICD-10-CM | POA: Diagnosis not present

## 2023-04-01 DIAGNOSIS — Z886 Allergy status to analgesic agent status: Secondary | ICD-10-CM | POA: Diagnosis not present

## 2023-04-01 DIAGNOSIS — Z006 Encounter for examination for normal comparison and control in clinical research program: Secondary | ICD-10-CM | POA: Diagnosis not present

## 2023-04-01 DIAGNOSIS — I498 Other specified cardiac arrhythmias: Secondary | ICD-10-CM | POA: Diagnosis not present

## 2023-04-02 ENCOUNTER — Encounter: Payer: Self-pay | Admitting: Family Medicine

## 2023-04-02 DIAGNOSIS — I10 Essential (primary) hypertension: Secondary | ICD-10-CM | POA: Diagnosis not present

## 2023-04-02 DIAGNOSIS — Z45018 Encounter for adjustment and management of other part of cardiac pacemaker: Secondary | ICD-10-CM | POA: Diagnosis not present

## 2023-04-02 DIAGNOSIS — R931 Abnormal findings on diagnostic imaging of heart and coronary circulation: Secondary | ICD-10-CM | POA: Diagnosis not present

## 2023-04-02 DIAGNOSIS — G4733 Obstructive sleep apnea (adult) (pediatric): Secondary | ICD-10-CM | POA: Diagnosis not present

## 2023-04-02 DIAGNOSIS — E785 Hyperlipidemia, unspecified: Secondary | ICD-10-CM | POA: Diagnosis not present

## 2023-04-02 DIAGNOSIS — E119 Type 2 diabetes mellitus without complications: Secondary | ICD-10-CM | POA: Diagnosis not present

## 2023-04-02 DIAGNOSIS — J9811 Atelectasis: Secondary | ICD-10-CM | POA: Diagnosis not present

## 2023-04-02 DIAGNOSIS — I442 Atrioventricular block, complete: Secondary | ICD-10-CM | POA: Diagnosis not present

## 2023-04-07 ENCOUNTER — Inpatient Hospital Stay: Payer: Medicare Other | Attending: Hematology & Oncology

## 2023-04-07 ENCOUNTER — Inpatient Hospital Stay: Payer: Medicare Other

## 2023-04-07 ENCOUNTER — Inpatient Hospital Stay: Payer: Medicare Other | Admitting: Hematology & Oncology

## 2023-04-07 VITALS — BP 128/65 | HR 82 | Temp 98.1°F | Resp 18

## 2023-04-07 DIAGNOSIS — Z79899 Other long term (current) drug therapy: Secondary | ICD-10-CM | POA: Insufficient documentation

## 2023-04-07 DIAGNOSIS — D511 Vitamin B12 deficiency anemia due to selective vitamin B12 malabsorption with proteinuria: Secondary | ICD-10-CM

## 2023-04-07 DIAGNOSIS — E611 Iron deficiency: Secondary | ICD-10-CM | POA: Insufficient documentation

## 2023-04-07 DIAGNOSIS — D508 Other iron deficiency anemias: Secondary | ICD-10-CM

## 2023-04-07 DIAGNOSIS — C8332 Diffuse large B-cell lymphoma, intrathoracic lymph nodes: Secondary | ICD-10-CM | POA: Diagnosis not present

## 2023-04-07 DIAGNOSIS — D510 Vitamin B12 deficiency anemia due to intrinsic factor deficiency: Secondary | ICD-10-CM | POA: Insufficient documentation

## 2023-04-07 LAB — CMP (CANCER CENTER ONLY)
ALT: 19 U/L (ref 0–44)
AST: 21 U/L (ref 15–41)
Albumin: 4.1 g/dL (ref 3.5–5.0)
Alkaline Phosphatase: 62 U/L (ref 38–126)
Anion gap: 10 (ref 5–15)
BUN: 38 mg/dL — ABNORMAL HIGH (ref 8–23)
CO2: 29 mmol/L (ref 22–32)
Calcium: 10.2 mg/dL (ref 8.9–10.3)
Chloride: 97 mmol/L — ABNORMAL LOW (ref 98–111)
Creatinine: 1.66 mg/dL — ABNORMAL HIGH (ref 0.61–1.24)
GFR, Estimated: 40 mL/min — ABNORMAL LOW (ref >60)
Glucose, Bld: 249 mg/dL — ABNORMAL HIGH (ref 70–99)
Potassium: 4.3 mmol/L (ref 3.5–5.1)
Sodium: 136 mmol/L (ref 135–145)
Total Bilirubin: 0.6 mg/dL (ref 0.3–1.2)
Total Protein: 7.1 g/dL (ref 6.5–8.1)

## 2023-04-07 LAB — IRON AND IRON BINDING CAPACITY (CC-WL,HP ONLY)
Iron: 84 ug/dL (ref 45–182)
Saturation Ratios: 23 % (ref 17.9–39.5)
TIBC: 367 ug/dL (ref 250–450)
UIBC: 283 ug/dL (ref 117–376)

## 2023-04-07 LAB — CBC WITH DIFFERENTIAL (CANCER CENTER ONLY)
Abs Immature Granulocytes: 0.07 10*3/uL (ref 0.00–0.07)
Basophils Absolute: 0 10*3/uL (ref 0.0–0.1)
Basophils Relative: 0 %
Eosinophils Absolute: 0.1 10*3/uL (ref 0.0–0.5)
Eosinophils Relative: 1 %
HCT: 42.2 % (ref 39.0–52.0)
Hemoglobin: 13.9 g/dL (ref 13.0–17.0)
Immature Granulocytes: 1 %
Lymphocytes Relative: 18 %
Lymphs Abs: 1.7 10*3/uL (ref 0.7–4.0)
MCH: 29.3 pg (ref 26.0–34.0)
MCHC: 32.9 g/dL (ref 30.0–36.0)
MCV: 88.8 fL (ref 80.0–100.0)
Monocytes Absolute: 0.6 10*3/uL (ref 0.1–1.0)
Monocytes Relative: 7 %
Neutro Abs: 6.6 10*3/uL (ref 1.7–7.7)
Neutrophils Relative %: 73 %
Platelet Count: 203 10*3/uL (ref 150–400)
RBC: 4.75 MIL/uL (ref 4.22–5.81)
RDW: 16.2 % — ABNORMAL HIGH (ref 11.5–15.5)
WBC Count: 9.1 10*3/uL (ref 4.0–10.5)
nRBC: 0 % (ref 0.0–0.2)

## 2023-04-07 LAB — RETICULOCYTES
Immature Retic Fract: 25.3 % — ABNORMAL HIGH (ref 2.3–15.9)
RBC.: 4.7 MIL/uL (ref 4.22–5.81)
Retic Count, Absolute: 79 10*3/uL (ref 19.0–186.0)
Retic Ct Pct: 1.7 % (ref 0.4–3.1)

## 2023-04-07 LAB — VITAMIN B12: Vitamin B-12: 302 pg/mL (ref 180–914)

## 2023-04-07 LAB — FERRITIN: Ferritin: 1756 ng/mL — ABNORMAL HIGH (ref 24–336)

## 2023-04-07 MED ORDER — CYANOCOBALAMIN 1000 MCG/ML IJ SOLN
1000.0000 ug | Freq: Once | INTRAMUSCULAR | Status: AC
  Administered 2023-04-07: 1000 ug via INTRAMUSCULAR
  Filled 2023-04-07: qty 1

## 2023-04-07 NOTE — Patient Instructions (Signed)
Vitamin B12 Deficiency Vitamin B12 deficiency means that your body does not have enough vitamin B12. The body needs this important vitamin: To make red blood cells. To make genes (DNA). To help the nerves work. If you do not have enough vitamin B12 in your body, you can have health problems, such as not having enough red blood cells in the blood (anemia). What are the causes? Not eating enough foods that contain vitamin B12. Not being able to take in (absorb) vitamin B12 from the food that you eat. Certain diseases. A condition in which the body does not make enough of a certain protein. This results in your body not taking in enough vitamin B12. Having a surgery in which part of the stomach or small intestine is taken out. Taking medicines that make it hard for the body to take in vitamin B12. These include: Heartburn medicines. Some medicines that are used to treat diabetes. What increases the risk? Being an older adult. Eating a vegetarian or vegan diet that does not include any foods that come from animals. Not eating enough foods that contain vitamin B12 while you are pregnant. Taking certain medicines. Having alcoholism. What are the signs or symptoms? In some cases, there are no symptoms. If the condition leads to too few blood cells or nerve damage, symptoms can occur, such as: Feeling weak or tired. Not being hungry. Losing feeling (numbness) or tingling in your hands and feet. Redness and burning of the tongue. Feeling sad (depressed). Confusion or memory problems. Trouble walking. If anemia is very bad, symptoms can include: Being short of breath. Being dizzy. Having a very fast heartbeat. How is this treated? Changing the way you eat and drink, such as: Eating more foods that contain vitamin B12. Drinking little or no alcohol. Getting vitamin B12 shots. Taking vitamin B12 supplements by mouth (orally). Your doctor will tell you the dose that is best for you. Follow  these instructions at home: Eating and drinking  Eat foods that come from animals and have a lot of vitamin B12 in them. These include: Meats and poultry. This includes beef, pork, chicken, [place], and organ meats, such as liver. Seafood, such as clams, rainbow trout, salmon, tuna, and haddock. Eggs. Dairy foods such as milk, yogurt, and cheese. Eat breakfast cereals that have vitamin B12 added to them (are fortified). Check the label. The items listed above may not be a complete list of foods and beverages you can eat and drink. Contact a dietitian for more information. Alcohol use Do not drink alcohol if: Your doctor tells you not to drink. You are pregnant, may be pregnant, or are planning to become pregnant. If you drink alcohol: Limit how much you have to: 0-1 drink a day for women. 0-2 drinks a day for men. Know how much alcohol is in your drink. In the [place] one drink equals one 12 oz bottle of beer (355 mL), one 5 oz glass of wine (148 mL), or one 1 oz glass of hard liquor (44 mL). General instructions Get any vitamin B12 shots if told by your doctor. Take supplements only as told by your doctor. Follow the directions. Keep all follow-up visits. Contact a doctor if: Your symptoms come back. Your symptoms get worse or do not get better with treatment. Get help right away if: You have trouble breathing. You have a very fast heartbeat. You have chest pain. You get dizzy. You faint. These symptoms may be an emergency. Get help right away. Call 911.  Do not wait to see if the symptoms will go away. Do not drive yourself to the hospital. Summary Vitamin B12 deficiency means that your body is not getting enough of the vitamin. In some cases, there are no symptoms of this condition. Treatment may include making a change in the way you eat and drink, getting shots, or taking supplements. Eat foods that have vitamin B12 in them. This information is not intended to replace advice  given to you by your health care provider. Make sure you discuss any questions you have with your health care provider. Document Revised: 02/09/2021 Document Reviewed: 02/09/2021 [employer] Patient Education  2024 [employer].

## 2023-04-08 ENCOUNTER — Encounter (HOSPITAL_BASED_OUTPATIENT_CLINIC_OR_DEPARTMENT_OTHER): Payer: Medicare Other | Attending: General Surgery | Admitting: General Surgery

## 2023-04-08 ENCOUNTER — Telehealth: Payer: Self-pay | Admitting: Family Medicine

## 2023-04-08 ENCOUNTER — Ambulatory Visit: Payer: Medicare Other | Admitting: Podiatry

## 2023-04-08 DIAGNOSIS — I2510 Atherosclerotic heart disease of native coronary artery without angina pectoris: Secondary | ICD-10-CM | POA: Insufficient documentation

## 2023-04-08 DIAGNOSIS — E11622 Type 2 diabetes mellitus with other skin ulcer: Secondary | ICD-10-CM | POA: Diagnosis not present

## 2023-04-08 DIAGNOSIS — E1122 Type 2 diabetes mellitus with diabetic chronic kidney disease: Secondary | ICD-10-CM | POA: Diagnosis not present

## 2023-04-08 DIAGNOSIS — C8330 Diffuse large B-cell lymphoma, unspecified site: Secondary | ICD-10-CM | POA: Diagnosis not present

## 2023-04-08 DIAGNOSIS — E11621 Type 2 diabetes mellitus with foot ulcer: Secondary | ICD-10-CM | POA: Diagnosis not present

## 2023-04-08 DIAGNOSIS — Z951 Presence of aortocoronary bypass graft: Secondary | ICD-10-CM | POA: Diagnosis not present

## 2023-04-08 DIAGNOSIS — L97812 Non-pressure chronic ulcer of other part of right lower leg with fat layer exposed: Secondary | ICD-10-CM | POA: Diagnosis not present

## 2023-04-08 DIAGNOSIS — N1832 Chronic kidney disease, stage 3b: Secondary | ICD-10-CM | POA: Diagnosis not present

## 2023-04-08 DIAGNOSIS — I5042 Chronic combined systolic (congestive) and diastolic (congestive) heart failure: Secondary | ICD-10-CM | POA: Insufficient documentation

## 2023-04-08 DIAGNOSIS — L97822 Non-pressure chronic ulcer of other part of left lower leg with fat layer exposed: Secondary | ICD-10-CM | POA: Insufficient documentation

## 2023-04-08 DIAGNOSIS — I130 Hypertensive heart and chronic kidney disease with heart failure and stage 1 through stage 4 chronic kidney disease, or unspecified chronic kidney disease: Secondary | ICD-10-CM | POA: Insufficient documentation

## 2023-04-08 DIAGNOSIS — S81802A Unspecified open wound, left lower leg, initial encounter: Secondary | ICD-10-CM | POA: Diagnosis not present

## 2023-04-08 DIAGNOSIS — S81801A Unspecified open wound, right lower leg, initial encounter: Secondary | ICD-10-CM | POA: Diagnosis not present

## 2023-04-08 DIAGNOSIS — R600 Localized edema: Secondary | ICD-10-CM | POA: Diagnosis not present

## 2023-04-08 NOTE — Progress Notes (Addendum)
Instructions: May shower and wash wound with dial antibacterial soap and water prior to dressing change. Cleanser: Vashe 5.8 (oz) 1 x Per Week/30 Days Discharge Instructions: Cleanse the wound with Vashe prior to applying a clean dressing using gauze sponges, not tissue or cotton balls. Cleanser: Wound Cleanser 1 x Per Week/30 Days Discharge Instructions: Cleanse the wound with wound cleanser prior to applying a clean dressing using gauze sponges, not tissue or cotton balls. Prim Dressing: Hydrofera Blue Ready Transfer Foam, 2.5x2.5 (in/in) 1 x Per Week/30 Days ary Discharge Instructions: Apply directly to wound bed as directed Secondary Dressing: Woven Gauze Sponge, Non-Sterile 4x4 in (Generic) 1 x Per Week/30 Days Discharge Instructions: Apply over primary dressing as directed. Compression Wrap: Urgo K2 Lite, (equivalent to a 3 layer) two layer compression system, regular (Generic) 1 x Per Week/30 Days Discharge Instructions: Apply Urgo K2 Lite as directed (alternative to 3 layer compression). Compression Wrap: Tubular netting #5 (Generic) 1 x Per Week/30 Days Wound #16 - Lower Leg Wound Laterality: Left, Anterior Cleanser: Soap and Water 1 x Per Week/30 Days Discharge Instructions: May shower and wash wound with dial antibacterial soap and water prior to dressing change. Cleanser: Vashe 5.8 (oz) 1 x Per Week/30 Days Discharge Instructions: Cleanse the wound with Vashe prior to applying a clean dressing using gauze sponges, not tissue or cotton balls. Cleanser: Wound Cleanser 1 x Per Week/30 Days Discharge Instructions: Cleanse the wound with wound cleanser prior to applying a clean dressing using gauze sponges, not tissue or cotton balls. Prim  Dressing: Hydrofera Blue Ready Transfer Foam, 2.5x2.5 (in/in) 1 x Per Week/30 Days ary Discharge Instructions: Apply directly to wound bed as directed Secondary Dressing: Woven Gauze Sponge, Non-Sterile 4x4 in (Generic) 1 x Per Week/30 Days Discharge Instructions: Apply over primary dressing as directed. Compression Wrap: Urgo K2 Lite, (equivalent to a 3 layer) two layer compression system, regular (Generic) 1 x Per Week/30 Days Discharge Instructions: Apply Urgo K2 Lite as directed (alternative to 3 layer compression). Compression Wrap: Tubular netting #5 (Generic) 1 x Per Week/30 Days Wound #17 - Upper Leg Wound Laterality: Right, Lateral Cleanser: Soap and Water Every Other Day/30 Days Discharge Instructions: May shower and wash wound with dial antibacterial soap and water prior to dressing change. Prim Dressing: Xeroform Occlusive Gauze Dressing, 4x4 in (DME) (Generic) Every Other Day/30 Days ary Discharge Instructions: Apply to wound bed as instructed Secondary Dressing: Bordered Gauze, 4x4 in (DME) (Generic) Every Other Day/30 Days Discharge Instructions: Apply over primary dressing as directed. Richard [name], Richard [name] ([id number]) [id number] Page 6 of 13 Wound #18 - Elbow Wound Laterality: Left Cleanser: Soap and Water Every Other Day/30 Days Discharge Instructions: May shower and wash wound with dial antibacterial soap and water prior to dressing change. Prim Dressing: Maxorb Extra Ag+ Alginate Dressing, 2x2 (in/in) (DME) (Generic) Every Other Day/30 Days ary Discharge Instructions: Apply to wound bed as instructed Secondary Dressing: Bordered Gauze, 2x3.75 in (DME) (Generic) Every Other Day/30 Days Discharge Instructions: Apply over primary dressing as directed. Electronic Signature(s) Signed: 04/08/2023 12:20:40 PM By: Duanne Guess MD FACS Signed: 04/08/2023 3:53:00 PM By: Karie Schwalbe RN Previous Signature: 04/08/2023 11:04:07 AM Version By: Duanne Guess MD FACS Previous Signature: 04/08/2023 11:00:37 AM Version By: Duanne Guess MD FACS Entered By: Karie Schwalbe on 04/08/2023 08:05:54 -------------------------------------------------------------------------------- Problem List Details Patient Name: Date of Service: Richard [name], Richard Presto [name] L. 04/08/2023 10:15 A M Medical Record Number: [id number] Patient Account Number: [id number] Date of Birth/Sex: Treating RN: [birth year] (82 y.o. M)  existing wounds  on his legs are both smaller with just a little bit of slough on each surface. Edema control is good. Richard [name], Richard [name] ([id number]) [id number] Page 11 of 13 I debrided a small amount of eschar and crust from his right thigh wound. The elbow skin tear did not require debridement. I also debrided slough from the anterior leg wounds bilaterally. Will continue Hydrofera Blue to the leg wounds with Urgo lite compression wraps. Apply silver alginate and foam border dressing to the elbow and some Xeroform and a foam border dressing to the thigh. Follow-up in 1 week. Electronic Signature(s) Signed: 04/09/2023 5:49:45 PM By: Shawn Stall RN, BSN Signed: 04/10/2023 7:55:21 AM By: Duanne Guess MD FACS Previous Signature: 04/08/2023 11:01:56 AM Version By: Duanne Guess MD FACS Entered By: Shawn Stall on 04/09/2023 14:43:30 -------------------------------------------------------------------------------- HxROS Details Patient Name: Date of Service: Richard [name], Richard Presto [name] L. 04/08/2023 10:15 A M Medical Record Number: [id number] Patient Account Number: [id number] Date of Birth/Sex: Treating RN: 05-Feb-1941 (82 y.o. M) Primary Care Provider: Seabron Spates Other Clinician: Referring Provider: Treating Provider/Extender: Verner Mould in Treatment: 6 Information Obtained From Patient Eyes Medical History: Positive for: Cataracts - Surgery Hematologic/Lymphatic Medical History: Positive for: Anemia Respiratory Medical History: Positive for: Sleep Apnea Cardiovascular Medical History: Positive for: Arrhythmia; Congestive Heart Failure; Coronary Artery Disease; Hypertension Endocrine Medical History: Positive for: Type II Diabetes Time with diabetes: 21 years Treated with: Oral agents Blood sugar tested every day: Yes Tested : Q am Blood sugar testing results: Breakfast: 202 Musculoskeletal Medical History: Positive for:  Osteoarthritis Past Medical History Notes: Spinal Stenosis Neurologic Medical History: Positive for: Neuropathy Oncologic Medical History: Positive for: Received Chemotherapy Negative for: Received Radiation Richard [name], Richard [name] ([id number]) [phone].pdf Page 12 of 13 Past Medical History Notes: 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 HBO Extended History Items Eyes: Cataracts Immunizations Pneumococcal Vaccine: Received Pneumococcal Vaccination: Yes Received Pneumococcal Vaccination On or After 60th Birthday: No Implantable Devices Yes Hospitalization / Surgery History Type of Hospitalization/Surgery cellulitis right leg 10/25-10/28/2023 balloon angioplasty 03/2021 12/29/2021 CHF Family and Social History Cancer: Yes - Mother; Diabetes: Yes - Maternal Grandparents; Heart Disease: Yes - Paternal Grandparents; Hereditary Spherocytosis: No; Hypertension: Yes - Paternal Grandparents; Kidney Disease: No; Lung Disease: No; Seizures: Yes - Child; Stroke: Yes - Father; Thyroid Problems: No; Tuberculosis: No; Never smoker; Marital Status - Divorced; Alcohol Use: Never; Drug Use: No History; Caffeine Use: Rarely; Financial Concerns: No; Food, Clothing or Shelter Needs: No; Support System Lacking: No; Transportation Concerns: No Electronic Signature(s) Signed: 04/08/2023 11:04:07 AM By: Duanne Guess MD FACS Entered By: Duanne Guess on 04/08/2023 07:59:53 -------------------------------------------------------------------------------- SuperBill Details Patient Name: Date of Service: Richard [name], Richard Presto [name] L. 04/08/2023 Medical Record Number: [id number] Patient Account Number: [id number] Date of Birth/Sex: Treating RN: [birth year] (82 y.o. 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 layer exposed L97.822 Non-pressure chronic ulcer of other part of left lower leg with fat layer exposed L98.492 Non-pressure chronic ulcer of skin of other sites with fat layer exposed L97.111 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 E11.622 Type 2 diabetes mellitus with other skin ulcer I50.42 Chronic combined systolic (congestive) and diastolic (congestive) heart failure N18.32 Chronic kidney disease, stage 3b R60.0 Localized edema Facility Procedures : [facility] CPT4 Code:  L. 04/08/2023 10:15 A M Medical Record Number: [id number] Patient Account Number: [id number] Date of Birth/Sex: Treating RN: [birth year] (82 y.o. M) Primary Care Provider: Seabron Spates Other Clinician: Referring Provider: Treating Provider/Extender: Verner Mould in Treatment: 6 History of Present Illness HPI Description: Admission 7/15 Mr. Ferman Poehler is an 82 year old male with a past medical history of type 2 diabetes on oral agents, diffuse large B-cell lymphoma, and coronary artery disease status post CABG that presents to the clinic for wounds to his left upper extremity and bilateral lower extremities. He states that he went to urgent care to be evaluated for a fever. And he was instructed to go to the ED to be further evaluated for possible sepsis. Unfortunately he passed out in his car After his appointment and was not discovered for another 3 hours. He was eventually discovered and EMS had to take him out of his vehicle. This is when he developed abrasions to his left arm and legs. He has been using  Xeroform to his wound beds every other day. He reports improvement to all the wounds except for the one on the back of his right leg. He denies pain or signs of infection. 7/22; patient presents for 1 week follow-up. He has no issues or complaints today. He has tolerated the compression wrap well on his right lower extremity. He has been able to do the dressing changes without issues to his left leg and left arm. His left arm wound has healed. He denies signs of infection. 7/29; patient presents for 1 week follow-up. He reports that his left leg wound is healed. He tolerated the compression wrap well on his right leg. He has no issues or complaints today. He denies signs of infection. 8/4; patient presents for 1 week follow-up. He continues to see improvement in wound healing to his right lower extremity. He has no issues or complaints today. He denies signs of infection. He does state that he went to the dermatologist and had some lesions removed on his face and left leg. He has been keeping these covered with a Band-Aid. 8/19; patient presents for follow-up. He is tolerated the compression wrap well. He has no issues or complaints today. He now only has 1 remaining wound. 9/1; patient presents for 1 week follow-up. He has tolerated the compression wrap well and has no issues or complaints today. He has been golfing 3 times weekly. He is in good spirits today. 9/15; patient presents for follow-up. He has no issues or complaints today. The wound is closed. Readmission 05/14/2022 Mr. Khizar Warga is an 82 year old male with a past medical history of uncontrolled type 2 diabetes on oral agents, diffuse large B-cell lymphoma and coronary artery disease status post CABG that presents the clinic for 3 wounds. He developed a right knee wound when he fell out of his golf cart 3 weeks ago. He developed increased warmth and erythema to the right leg. While he was still waiting in the ED on 04/24/2022 for this  issue he dropped his phone and created a wound to his left thigh. He has been using Xeroform to these areas. He ultimately required admission from the ED for right lower extremity cellulitis and was treated with Zyvox and Keflex. He also has a right plantar foot wound that started after having callus debridement by his podiatrist. He has had this wound for at least 6 weeks. For the foot wound he has not been keeping it covered and walks around barefoot. He  existing wounds  on his legs are both smaller with just a little bit of slough on each surface. Edema control is good. Richard [name], Richard [name] ([id number]) [id number] Page 11 of 13 I debrided a small amount of eschar and crust from his right thigh wound. The elbow skin tear did not require debridement. I also debrided slough from the anterior leg wounds bilaterally. Will continue Hydrofera Blue to the leg wounds with Urgo lite compression wraps. Apply silver alginate and foam border dressing to the elbow and some Xeroform and a foam border dressing to the thigh. Follow-up in 1 week. Electronic Signature(s) Signed: 04/09/2023 5:49:45 PM By: Shawn Stall RN, BSN Signed: 04/10/2023 7:55:21 AM By: Duanne Guess MD FACS Previous Signature: 04/08/2023 11:01:56 AM Version By: Duanne Guess MD FACS Entered By: Shawn Stall on 04/09/2023 14:43:30 -------------------------------------------------------------------------------- HxROS Details Patient Name: Date of Service: Richard [name], Richard Presto [name] L. 04/08/2023 10:15 A M Medical Record Number: [id number] Patient Account Number: [id number] Date of Birth/Sex: Treating RN: 05-Feb-1941 (82 y.o. M) Primary Care Provider: Seabron Spates Other Clinician: Referring Provider: Treating Provider/Extender: Verner Mould in Treatment: 6 Information Obtained From Patient Eyes Medical History: Positive for: Cataracts - Surgery Hematologic/Lymphatic Medical History: Positive for: Anemia Respiratory Medical History: Positive for: Sleep Apnea Cardiovascular Medical History: Positive for: Arrhythmia; Congestive Heart Failure; Coronary Artery Disease; Hypertension Endocrine Medical History: Positive for: Type II Diabetes Time with diabetes: 21 years Treated with: Oral agents Blood sugar tested every day: Yes Tested : Q am Blood sugar testing results: Breakfast: 202 Musculoskeletal Medical History: Positive for:  Osteoarthritis Past Medical History Notes: Spinal Stenosis Neurologic Medical History: Positive for: Neuropathy Oncologic Medical History: Positive for: Received Chemotherapy Negative for: Received Radiation Richard [name], Richard [name] ([id number]) [phone].pdf Page 12 of 13 Past Medical History Notes: 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 HBO Extended History Items Eyes: Cataracts Immunizations Pneumococcal Vaccine: Received Pneumococcal Vaccination: Yes Received Pneumococcal Vaccination On or After 60th Birthday: No Implantable Devices Yes Hospitalization / Surgery History Type of Hospitalization/Surgery cellulitis right leg 10/25-10/28/2023 balloon angioplasty 03/2021 12/29/2021 CHF Family and Social History Cancer: Yes - Mother; Diabetes: Yes - Maternal Grandparents; Heart Disease: Yes - Paternal Grandparents; Hereditary Spherocytosis: No; Hypertension: Yes - Paternal Grandparents; Kidney Disease: No; Lung Disease: No; Seizures: Yes - Child; Stroke: Yes - Father; Thyroid Problems: No; Tuberculosis: No; Never smoker; Marital Status - Divorced; Alcohol Use: Never; Drug Use: No History; Caffeine Use: Rarely; Financial Concerns: No; Food, Clothing or Shelter Needs: No; Support System Lacking: No; Transportation Concerns: No Electronic Signature(s) Signed: 04/08/2023 11:04:07 AM By: Duanne Guess MD FACS Entered By: Duanne Guess on 04/08/2023 07:59:53 -------------------------------------------------------------------------------- SuperBill Details Patient Name: Date of Service: Richard [name], Richard Presto [name] L. 04/08/2023 Medical Record Number: [id number] Patient Account Number: [id number] Date of Birth/Sex: Treating RN: [birth year] (82 y.o. 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 layer exposed L97.822 Non-pressure chronic ulcer of other part of left lower leg with fat layer exposed L98.492 Non-pressure chronic ulcer of skin of other sites with fat layer exposed L97.111 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 E11.622 Type 2 diabetes mellitus with other skin ulcer I50.42 Chronic combined systolic (congestive) and diastolic (congestive) heart failure N18.32 Chronic kidney disease, stage 3b R60.0 Localized edema Facility Procedures : [facility] CPT4 Code:  Richard [name], Richard [name] ([id number]) [id number] Page 1 of 13 Visit Report for 04/08/2023 Chief Complaint Document Details Patient Name: Date of Service: Ursula Alert [name] L. 04/08/2023 10:15 A M Medical Record Number: [id number] Patient Account Number: [id number] Date of Birth/Sex: Treating RN: 28-May-1941 (82 y.o. M) Primary Care Provider: Seabron Spates Other Clinician: Referring Provider: Treating Provider/Extender: Verner Mould in Treatment: 6 Information Obtained from: Patient Chief Complaint 01/12/2021; Wounds to his left upper extremity and bilateral lower extremities. 05/14/2022; bilateral lower extremity wounds, right plantar foot wound 09/05/2022; bilateral lower extremity wounds, left thumb wound 02/25/2023: left foot DFU and right knee and RLE wounds Electronic Signature(s) Signed: 04/08/2023 10:58:26 AM By: Duanne Guess MD FACS Entered By: Duanne Guess on 04/08/2023 07:58:26 -------------------------------------------------------------------------------- Debridement Details Patient Name: Date of Service: Ursula Alert [name] L. 04/08/2023 10:15 A M Medical Record Number: [id number] Patient Account Number: [id number] Date of Birth/Sex: Treating RN: 06-04-1941 (82 y.o. Dianna Limbo Primary Care Provider: Seabron Spates Other Clinician: Referring Provider: Treating Provider/Extender: Verner Mould in Treatment: 6 Debridement Performed for Assessment: Wound #16 Left,Anterior Lower Leg Performed By: Physician Duanne Guess, MD The following information was scribed by: Karie Schwalbe The information was scribed for: Duanne Guess Debridement Type: Debridement Level of Consciousness (Pre-procedure): Awake and Alert Pre-procedure Verification/Time Out Yes - 10:52 Taken: Start Time: 10:52 Pain Control: Lidocaine 4% T opical Solution Percent of Wound Bed Debrided: 100% T Area  Debrided (cm): otal 1.77 Tissue and other material debrided: Non-Viable, Slough, Slough Level: Non-Viable Tissue Debridement Description: Selective/Open Wound Instrument: Curette Bleeding: Minimum Hemostasis Achieved: Pressure End Time: 10:55 Procedural Pain: 0 Post Procedural Pain: 0 Response to Treatment: Procedure was tolerated well Level of Consciousness (Post- Awake and Alert procedure): Post Debridement Measurements of Total Wound Length: (cm) 1.5 Richard [name], Richard [name] ([id number]) [id number] Page 2 of 13 Width: (cm) 1.5 Depth: (cm) 0.1 Volume: (cm) 0.177 Character of Wound/Ulcer Post Debridement: Improved Post Procedure Diagnosis Same as Pre-procedure Electronic Signature(s) Signed: 04/08/2023 11:04:07 AM By: Duanne Guess MD FACS Signed: 04/08/2023 3:53:00 PM By: Karie Schwalbe RN Entered By: Karie Schwalbe on 04/08/2023 07:56:07 -------------------------------------------------------------------------------- Debridement Details Patient Name: Date of Service: Richard [name], Richard Presto [name] L. 04/08/2023 10:15 A M Medical Record Number: [id number] Patient Account Number: [id number] Date of Birth/Sex: Treating RN: 1941/02/24 (82 y.o. Dianna Limbo Primary Care Provider: Seabron Spates Other Clinician: Referring Provider: Treating Provider/Extender: Verner Mould in Treatment: 6 Debridement Performed for Assessment: Wound #15 Right,Medial Lower Leg Performed By: Physician Duanne Guess, MD Debridement Type: Debridement Level of Consciousness (Pre-procedure): Awake and Alert Pre-procedure Verification/Time Out Yes - 10:52 Taken: Start Time: 10:52 Pain Control: Lidocaine 4% T opical Solution Percent of Wound Bed Debrided: 100% T Area Debrided (cm): otal 0.13 Tissue and other material debrided: Non-Viable, Slough, Slough Level: Non-Viable Tissue Debridement Description: Selective/Open Wound Instrument:  Curette Bleeding: Minimum Hemostasis Achieved: Pressure End Time: 10:55 Procedural Pain: 0 Post Procedural Pain: 0 Response to Treatment: Procedure was tolerated well Level of Consciousness (Post- Awake and Alert procedure): Post Debridement Measurements of Total Wound Length: (cm) 0.4 Width: (cm) 0.4 Depth: (cm) 0.1 Volume: (cm) 0.013 Character of Wound/Ulcer Post Debridement: Improved Post Procedure Diagnosis Same as Pre-procedure Electronic Signature(s) Signed: 04/08/2023 11:04:07 AM By: Duanne Guess MD FACS Signed: 04/08/2023 3:53:00 PM By: Karie Schwalbe RN Entered By: Karie Schwalbe on 04/08/2023 07:56:37 Richard [name] ([id number]) [id number] Page 3 of 13 -------------------------------------------------------------------------------- HPI Details Patient Name: Date of Service: Richard [name], Richard [name]  Instructions: May shower and wash wound with dial antibacterial soap and water prior to dressing change. Cleanser: Vashe 5.8 (oz) 1 x Per Week/30 Days Discharge Instructions: Cleanse the wound with Vashe prior to applying a clean dressing using gauze sponges, not tissue or cotton balls. Cleanser: Wound Cleanser 1 x Per Week/30 Days Discharge Instructions: Cleanse the wound with wound cleanser prior to applying a clean dressing using gauze sponges, not tissue or cotton balls. Prim Dressing: Hydrofera Blue Ready Transfer Foam, 2.5x2.5 (in/in) 1 x Per Week/30 Days ary Discharge Instructions: Apply directly to wound bed as directed Secondary Dressing: Woven Gauze Sponge, Non-Sterile 4x4 in (Generic) 1 x Per Week/30 Days Discharge Instructions: Apply over primary dressing as directed. Compression Wrap: Urgo K2 Lite, (equivalent to a 3 layer) two layer compression system, regular (Generic) 1 x Per Week/30 Days Discharge Instructions: Apply Urgo K2 Lite as directed (alternative to 3 layer compression). Compression Wrap: Tubular netting #5 (Generic) 1 x Per Week/30 Days Wound #16 - Lower Leg Wound Laterality: Left, Anterior Cleanser: Soap and Water 1 x Per Week/30 Days Discharge Instructions: May shower and wash wound with dial antibacterial soap and water prior to dressing change. Cleanser: Vashe 5.8 (oz) 1 x Per Week/30 Days Discharge Instructions: Cleanse the wound with Vashe prior to applying a clean dressing using gauze sponges, not tissue or cotton balls. Cleanser: Wound Cleanser 1 x Per Week/30 Days Discharge Instructions: Cleanse the wound with wound cleanser prior to applying a clean dressing using gauze sponges, not tissue or cotton balls. Prim  Dressing: Hydrofera Blue Ready Transfer Foam, 2.5x2.5 (in/in) 1 x Per Week/30 Days ary Discharge Instructions: Apply directly to wound bed as directed Secondary Dressing: Woven Gauze Sponge, Non-Sterile 4x4 in (Generic) 1 x Per Week/30 Days Discharge Instructions: Apply over primary dressing as directed. Compression Wrap: Urgo K2 Lite, (equivalent to a 3 layer) two layer compression system, regular (Generic) 1 x Per Week/30 Days Discharge Instructions: Apply Urgo K2 Lite as directed (alternative to 3 layer compression). Compression Wrap: Tubular netting #5 (Generic) 1 x Per Week/30 Days Wound #17 - Upper Leg Wound Laterality: Right, Lateral Cleanser: Soap and Water Every Other Day/30 Days Discharge Instructions: May shower and wash wound with dial antibacterial soap and water prior to dressing change. Prim Dressing: Xeroform Occlusive Gauze Dressing, 4x4 in (DME) (Generic) Every Other Day/30 Days ary Discharge Instructions: Apply to wound bed as instructed Secondary Dressing: Bordered Gauze, 4x4 in (DME) (Generic) Every Other Day/30 Days Discharge Instructions: Apply over primary dressing as directed. Richard [name], Richard [name] ([id number]) [id number] Page 6 of 13 Wound #18 - Elbow Wound Laterality: Left Cleanser: Soap and Water Every Other Day/30 Days Discharge Instructions: May shower and wash wound with dial antibacterial soap and water prior to dressing change. Prim Dressing: Maxorb Extra Ag+ Alginate Dressing, 2x2 (in/in) (DME) (Generic) Every Other Day/30 Days ary Discharge Instructions: Apply to wound bed as instructed Secondary Dressing: Bordered Gauze, 2x3.75 in (DME) (Generic) Every Other Day/30 Days Discharge Instructions: Apply over primary dressing as directed. Electronic Signature(s) Signed: 04/08/2023 12:20:40 PM By: Duanne Guess MD FACS Signed: 04/08/2023 3:53:00 PM By: Karie Schwalbe RN Previous Signature: 04/08/2023 11:04:07 AM Version By: Duanne Guess MD FACS Previous Signature: 04/08/2023 11:00:37 AM Version By: Duanne Guess MD FACS Entered By: Karie Schwalbe on 04/08/2023 08:05:54 -------------------------------------------------------------------------------- Problem List Details Patient Name: Date of Service: Richard [name], Richard Presto [name] L. 04/08/2023 10:15 A M Medical Record Number: [id number] Patient Account Number: [id number] Date of Birth/Sex: Treating RN: [birth year] (82 y.o. M)  Richard [name], Richard [name] ([id number]) [id number] Page 1 of 13 Visit Report for 04/08/2023 Chief Complaint Document Details Patient Name: Date of Service: Ursula Alert [name] L. 04/08/2023 10:15 A M Medical Record Number: [id number] Patient Account Number: [id number] Date of Birth/Sex: Treating RN: 28-May-1941 (82 y.o. M) Primary Care Provider: Seabron Spates Other Clinician: Referring Provider: Treating Provider/Extender: Verner Mould in Treatment: 6 Information Obtained from: Patient Chief Complaint 01/12/2021; Wounds to his left upper extremity and bilateral lower extremities. 05/14/2022; bilateral lower extremity wounds, right plantar foot wound 09/05/2022; bilateral lower extremity wounds, left thumb wound 02/25/2023: left foot DFU and right knee and RLE wounds Electronic Signature(s) Signed: 04/08/2023 10:58:26 AM By: Duanne Guess MD FACS Entered By: Duanne Guess on 04/08/2023 07:58:26 -------------------------------------------------------------------------------- Debridement Details Patient Name: Date of Service: Ursula Alert [name] L. 04/08/2023 10:15 A M Medical Record Number: [id number] Patient Account Number: [id number] Date of Birth/Sex: Treating RN: 06-04-1941 (82 y.o. Dianna Limbo Primary Care Provider: Seabron Spates Other Clinician: Referring Provider: Treating Provider/Extender: Verner Mould in Treatment: 6 Debridement Performed for Assessment: Wound #16 Left,Anterior Lower Leg Performed By: Physician Duanne Guess, MD The following information was scribed by: Karie Schwalbe The information was scribed for: Duanne Guess Debridement Type: Debridement Level of Consciousness (Pre-procedure): Awake and Alert Pre-procedure Verification/Time Out Yes - 10:52 Taken: Start Time: 10:52 Pain Control: Lidocaine 4% T opical Solution Percent of Wound Bed Debrided: 100% T Area  Debrided (cm): otal 1.77 Tissue and other material debrided: Non-Viable, Slough, Slough Level: Non-Viable Tissue Debridement Description: Selective/Open Wound Instrument: Curette Bleeding: Minimum Hemostasis Achieved: Pressure End Time: 10:55 Procedural Pain: 0 Post Procedural Pain: 0 Response to Treatment: Procedure was tolerated well Level of Consciousness (Post- Awake and Alert procedure): Post Debridement Measurements of Total Wound Length: (cm) 1.5 Richard [name], Richard [name] ([id number]) [id number] Page 2 of 13 Width: (cm) 1.5 Depth: (cm) 0.1 Volume: (cm) 0.177 Character of Wound/Ulcer Post Debridement: Improved Post Procedure Diagnosis Same as Pre-procedure Electronic Signature(s) Signed: 04/08/2023 11:04:07 AM By: Duanne Guess MD FACS Signed: 04/08/2023 3:53:00 PM By: Karie Schwalbe RN Entered By: Karie Schwalbe on 04/08/2023 07:56:07 -------------------------------------------------------------------------------- Debridement Details Patient Name: Date of Service: Richard [name], Richard Presto [name] L. 04/08/2023 10:15 A M Medical Record Number: [id number] Patient Account Number: [id number] Date of Birth/Sex: Treating RN: 1941/02/24 (82 y.o. Dianna Limbo Primary Care Provider: Seabron Spates Other Clinician: Referring Provider: Treating Provider/Extender: Verner Mould in Treatment: 6 Debridement Performed for Assessment: Wound #15 Right,Medial Lower Leg Performed By: Physician Duanne Guess, MD Debridement Type: Debridement Level of Consciousness (Pre-procedure): Awake and Alert Pre-procedure Verification/Time Out Yes - 10:52 Taken: Start Time: 10:52 Pain Control: Lidocaine 4% T opical Solution Percent of Wound Bed Debrided: 100% T Area Debrided (cm): otal 0.13 Tissue and other material debrided: Non-Viable, Slough, Slough Level: Non-Viable Tissue Debridement Description: Selective/Open Wound Instrument:  Curette Bleeding: Minimum Hemostasis Achieved: Pressure End Time: 10:55 Procedural Pain: 0 Post Procedural Pain: 0 Response to Treatment: Procedure was tolerated well Level of Consciousness (Post- Awake and Alert procedure): Post Debridement Measurements of Total Wound Length: (cm) 0.4 Width: (cm) 0.4 Depth: (cm) 0.1 Volume: (cm) 0.013 Character of Wound/Ulcer Post Debridement: Improved Post Procedure Diagnosis Same as Pre-procedure Electronic Signature(s) Signed: 04/08/2023 11:04:07 AM By: Duanne Guess MD FACS Signed: 04/08/2023 3:53:00 PM By: Karie Schwalbe RN Entered By: Karie Schwalbe on 04/08/2023 07:56:37 Richard [name] ([id number]) [id number] Page 3 of 13 -------------------------------------------------------------------------------- HPI Details Patient Name: Date of Service: Richard [name], Richard [name]  Primary Care Provider: Seabron Spates Other Clinician: Referring Provider: Treating Provider/Extender: Verner Mould in Treatment: 6 Active Problems ICD-10 Encounter Code Description Active Date MDM Diagnosis L97.812 Non-pressure chronic ulcer of other part of right lower leg with fat layer 02/25/2023 No Yes exposed L97.822 Non-pressure chronic ulcer of other part of left lower leg with fat layer exposed9/04/2023 No Yes L98.492 Non-pressure chronic ulcer of skin of other sites with fat layer exposed 04/08/2023 No Yes L97.111 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 04/08/2023 No Yes E11.622 Type 2 diabetes mellitus with other skin ulcer 02/25/2023 No Yes I50.42 Chronic combined systolic (congestive) and diastolic (congestive) heart failure 02/25/2023 No Yes N18.32 Chronic kidney disease, stage 3b 02/25/2023 No Yes R60.0 Localized edema 02/25/2023 No Yes Richard [name], Richard [name] ([id number]) [id number] Page 7 of 13 Inactive Problems ICD-10 Code Description Active Date Inactive Date E11.621 Type 2 diabetes mellitus with foot ulcer 02/25/2023 02/25/2023 Resolved Problems ICD-10 Code Description Active Date Resolved Date L97.522 Non-pressure chronic ulcer of other part of left foot with fat layer exposed 02/25/2023 02/25/2023 Electronic Signature(s) Signed: 04/08/2023 10:58:00 AM By: Duanne Guess MD FACS Previous Signature: 04/08/2023 10:47:56 AM Version By: Duanne Guess  MD FACS Entered By: Duanne Guess on 04/08/2023 07:57:59 -------------------------------------------------------------------------------- Progress Note Details Patient Name: Date of Service: Ursula Alert [name] L. 04/08/2023 10:15 A M Medical Record Number: [id number] Patient Account Number: [id number] Date of Birth/Sex: Treating RN: 14-Sep-1940 (82 y.o. M) Primary Care Provider: Seabron Spates Other Clinician: Referring Provider: Treating Provider/Extender: Verner Mould in Treatment: 6 Subjective Chief Complaint Information obtained from Patient 01/12/2021; Wounds to his left upper extremity and bilateral lower extremities. 05/14/2022; bilateral lower extremity wounds, right plantar foot wound 09/05/2022; bilateral lower extremity wounds, left thumb wound 02/25/2023: left foot DFU and right knee and RLE wounds History of Present Illness (HPI) Admission 7/15 [name] is an 82 year old male with a past medical history of type 2 diabetes on oral agents, diffuse large B-cell lymphoma, and coronary artery disease status post CABG that presents to the clinic for wounds to his left upper extremity and bilateral lower extremities. He states that he went to urgent care to be evaluated for a fever. And he was instructed to go to the ED to be further evaluated for possible sepsis. Unfortunately he passed out in his car After his appointment and was not discovered for another 3 hours. He was eventually discovered and EMS had to take him out of his vehicle. This is when he developed abrasions to his left arm and legs. He has been using Xeroform to his wound beds every other day. He reports improvement to all the wounds except for the one on the back of his right leg. He denies pain or signs of infection. 7/22; patient presents for 1 week follow-up. He has no issues or complaints today. He has tolerated the compression wrap well on his right lower extremity. He has  been able to do the dressing changes without issues to his left leg and left arm. His left arm wound has healed. He denies signs of infection. 7/29; patient presents for 1 week follow-up. He reports that his left leg wound is healed. He tolerated the compression wrap well on his right leg. He has no issues or complaints today. He denies signs of infection. 8/4; patient presents for 1 week follow-up. He continues to see improvement in wound healing to his right lower extremity. He has no issues or  Richard [name], Richard [name] ([id number]) [id number] Page 1 of 13 Visit Report for 04/08/2023 Chief Complaint Document Details Patient Name: Date of Service: Ursula Alert [name] L. 04/08/2023 10:15 A M Medical Record Number: [id number] Patient Account Number: [id number] Date of Birth/Sex: Treating RN: 28-May-1941 (82 y.o. M) Primary Care Provider: Seabron Spates Other Clinician: Referring Provider: Treating Provider/Extender: Verner Mould in Treatment: 6 Information Obtained from: Patient Chief Complaint 01/12/2021; Wounds to his left upper extremity and bilateral lower extremities. 05/14/2022; bilateral lower extremity wounds, right plantar foot wound 09/05/2022; bilateral lower extremity wounds, left thumb wound 02/25/2023: left foot DFU and right knee and RLE wounds Electronic Signature(s) Signed: 04/08/2023 10:58:26 AM By: Duanne Guess MD FACS Entered By: Duanne Guess on 04/08/2023 07:58:26 -------------------------------------------------------------------------------- Debridement Details Patient Name: Date of Service: Ursula Alert [name] L. 04/08/2023 10:15 A M Medical Record Number: [id number] Patient Account Number: [id number] Date of Birth/Sex: Treating RN: 06-04-1941 (82 y.o. Dianna Limbo Primary Care Provider: Seabron Spates Other Clinician: Referring Provider: Treating Provider/Extender: Verner Mould in Treatment: 6 Debridement Performed for Assessment: Wound #16 Left,Anterior Lower Leg Performed By: Physician Duanne Guess, MD The following information was scribed by: Karie Schwalbe The information was scribed for: Duanne Guess Debridement Type: Debridement Level of Consciousness (Pre-procedure): Awake and Alert Pre-procedure Verification/Time Out Yes - 10:52 Taken: Start Time: 10:52 Pain Control: Lidocaine 4% T opical Solution Percent of Wound Bed Debrided: 100% T Area  Debrided (cm): otal 1.77 Tissue and other material debrided: Non-Viable, Slough, Slough Level: Non-Viable Tissue Debridement Description: Selective/Open Wound Instrument: Curette Bleeding: Minimum Hemostasis Achieved: Pressure End Time: 10:55 Procedural Pain: 0 Post Procedural Pain: 0 Response to Treatment: Procedure was tolerated well Level of Consciousness (Post- Awake and Alert procedure): Post Debridement Measurements of Total Wound Length: (cm) 1.5 Richard [name], Richard [name] ([id number]) [id number] Page 2 of 13 Width: (cm) 1.5 Depth: (cm) 0.1 Volume: (cm) 0.177 Character of Wound/Ulcer Post Debridement: Improved Post Procedure Diagnosis Same as Pre-procedure Electronic Signature(s) Signed: 04/08/2023 11:04:07 AM By: Duanne Guess MD FACS Signed: 04/08/2023 3:53:00 PM By: Karie Schwalbe RN Entered By: Karie Schwalbe on 04/08/2023 07:56:07 -------------------------------------------------------------------------------- Debridement Details Patient Name: Date of Service: Richard [name], Richard Presto [name] L. 04/08/2023 10:15 A M Medical Record Number: [id number] Patient Account Number: [id number] Date of Birth/Sex: Treating RN: 1941/02/24 (82 y.o. Dianna Limbo Primary Care Provider: Seabron Spates Other Clinician: Referring Provider: Treating Provider/Extender: Verner Mould in Treatment: 6 Debridement Performed for Assessment: Wound #15 Right,Medial Lower Leg Performed By: Physician Duanne Guess, MD Debridement Type: Debridement Level of Consciousness (Pre-procedure): Awake and Alert Pre-procedure Verification/Time Out Yes - 10:52 Taken: Start Time: 10:52 Pain Control: Lidocaine 4% T opical Solution Percent of Wound Bed Debrided: 100% T Area Debrided (cm): otal 0.13 Tissue and other material debrided: Non-Viable, Slough, Slough Level: Non-Viable Tissue Debridement Description: Selective/Open Wound Instrument:  Curette Bleeding: Minimum Hemostasis Achieved: Pressure End Time: 10:55 Procedural Pain: 0 Post Procedural Pain: 0 Response to Treatment: Procedure was tolerated well Level of Consciousness (Post- Awake and Alert procedure): Post Debridement Measurements of Total Wound Length: (cm) 0.4 Width: (cm) 0.4 Depth: (cm) 0.1 Volume: (cm) 0.013 Character of Wound/Ulcer Post Debridement: Improved Post Procedure Diagnosis Same as Pre-procedure Electronic Signature(s) Signed: 04/08/2023 11:04:07 AM By: Duanne Guess MD FACS Signed: 04/08/2023 3:53:00 PM By: Karie Schwalbe RN Entered By: Karie Schwalbe on 04/08/2023 07:56:37 Richard [name] ([id number]) [id number] Page 3 of 13 -------------------------------------------------------------------------------- HPI Details Patient Name: Date of Service: Richard [name], Richard [name]  wound is smaller with a little bit of slough on the surface. The left anterior tibial wound is more superficial but with essentially the same measurements this week. Slough has accumulated on the surface here, as well. Edema control remains good. 04/08/2023: While he was in the hospital for his pacemaker placement, a nurse pulled some tape off of his leg rather roughly and denuded the skin on his right thigh. He also bumped into a door frame and has a skin tear on his left elbow. The existing wounds on his legs are both smaller with just a little bit of slough on each surface. Edema control is good. Electronic Signature(s) Signed: 04/08/2023 10:59:41 AM By: Duanne Guess MD FACS Entered By: Duanne Guess on 04/08/2023 07:59:40 -------------------------------------------------------------------------------- Physical Exam Details Patient Name: Date of Service: Ursula Alert [name] L. 04/08/2023 10:15 A M Medical Record Number: [id number] Patient Account Number: [id number] Date of Birth/Sex: Treating RN: 1941/06/13 (82 y.o. M) Primary Care Provider: Seabron Spates Other Clinician: Referring Provider: Treating Provider/Extender: Verner Mould in Treatment: 6 Constitutional Hypertensive, asymptomatic. . . . no acute distress. Respiratory Normal work of breathing on room air. Notes 04/08/2023: While he was in the hospital for his pacemaker placement, a nurse pulled some tape off of his leg rather roughly and denuded the skin on his right thigh. He also bumped into a door frame and has a skin tear on his left elbow. The existing wounds on his legs are both smaller with just a little bit of slough on each surface. Edema control is good. Electronic Signature(s) Signed: 04/08/2023 11:00:22 AM  By: Duanne Guess MD FACS Entered By: Duanne Guess on 04/08/2023 08:00:22 -------------------------------------------------------------------------------- Physician Orders Details Patient Name: Date of Service: Richard [name], Richard Presto [name] L. 04/08/2023 10:15 A M Medical Record Number: [id number] Patient Account Number: [id number] Date of Birth/Sex: Treating RN: [birth year] (82 y.o. Dianna Limbo Primary Care Provider: Seabron Spates Other Clinician: Referring Provider: Treating Provider/Extender: Verner Mould in Treatment: 6 Verbal / Phone Orders: No Diagnosis Coding ICD-10 Coding Code Description 2168703109 Non-pressure chronic ulcer of other part of right lower leg with fat layer exposed L97.822 Non-pressure chronic ulcer of other part of left lower leg with fat layer exposed E11.622 Type 2 diabetes mellitus with other skin ulcer I50.42 Chronic combined systolic (congestive) and diastolic (congestive) heart failure CORDELRO, GAUTREAU ([id number]) [id number] Page 5 of 13 N18.32 Chronic kidney disease, stage 3b R60.0 Localized edema Follow-up Appointments ppointment in 1 week. - Dr. Lady Gary Room 04/16/23 at 10:15am Return A ppointment in 2 weeks. - Please ask front desk to schedule an appointment Return A Return appointment in 3 weeks. - Please ask front desk to schedule an appointment Return appointment in 1 month. - Please ask front desk to schedule an appointment Anesthetic (In clinic) Topical Lidocaine 5% applied to wound bed Bathing/ Shower/ Hygiene May shower and wash wound with soap and water. - Avoid getting moisture on wounds-the wraps on the Right leg Edema Control - Lymphedema / SCD / Other Bilateral Lower Extremities Elevate legs to the level of the heart or above for 30 minutes daily and/or when sitting for 3-4 times a day throughout the day. Avoid standing for long periods of time. Exercise regularly - As T  olerated Moisturize legs daily. Home Health Discontinue home health for wound care. - Discontinue Home health -[facility] Wound Treatment Wound #15 - Lower Leg Wound Laterality: Right, Medial Cleanser: Soap and Water 1 x Per Week/30 Days Discharge  L. 04/08/2023 10:15 A M Medical Record Number: [id number] Patient Account Number: [id number] Date of Birth/Sex: Treating RN: [birth year] (82 y.o. M) Primary Care Provider: Seabron Spates Other Clinician: Referring Provider: Treating Provider/Extender: Verner Mould in Treatment: 6 History of Present Illness HPI Description: Admission 7/15 Mr. Ferman Poehler is an 82 year old male with a past medical history of type 2 diabetes on oral agents, diffuse large B-cell lymphoma, and coronary artery disease status post CABG that presents to the clinic for wounds to his left upper extremity and bilateral lower extremities. He states that he went to urgent care to be evaluated for a fever. And he was instructed to go to the ED to be further evaluated for possible sepsis. Unfortunately he passed out in his car After his appointment and was not discovered for another 3 hours. He was eventually discovered and EMS had to take him out of his vehicle. This is when he developed abrasions to his left arm and legs. He has been using  Xeroform to his wound beds every other day. He reports improvement to all the wounds except for the one on the back of his right leg. He denies pain or signs of infection. 7/22; patient presents for 1 week follow-up. He has no issues or complaints today. He has tolerated the compression wrap well on his right lower extremity. He has been able to do the dressing changes without issues to his left leg and left arm. His left arm wound has healed. He denies signs of infection. 7/29; patient presents for 1 week follow-up. He reports that his left leg wound is healed. He tolerated the compression wrap well on his right leg. He has no issues or complaints today. He denies signs of infection. 8/4; patient presents for 1 week follow-up. He continues to see improvement in wound healing to his right lower extremity. He has no issues or complaints today. He denies signs of infection. He does state that he went to the dermatologist and had some lesions removed on his face and left leg. He has been keeping these covered with a Band-Aid. 8/19; patient presents for follow-up. He is tolerated the compression wrap well. He has no issues or complaints today. He now only has 1 remaining wound. 9/1; patient presents for 1 week follow-up. He has tolerated the compression wrap well and has no issues or complaints today. He has been golfing 3 times weekly. He is in good spirits today. 9/15; patient presents for follow-up. He has no issues or complaints today. The wound is closed. Readmission 05/14/2022 Mr. Khizar Warga is an 82 year old male with a past medical history of uncontrolled type 2 diabetes on oral agents, diffuse large B-cell lymphoma and coronary artery disease status post CABG that presents the clinic for 3 wounds. He developed a right knee wound when he fell out of his golf cart 3 weeks ago. He developed increased warmth and erythema to the right leg. While he was still waiting in the ED on 04/24/2022 for this  issue he dropped his phone and created a wound to his left thigh. He has been using Xeroform to these areas. He ultimately required admission from the ED for right lower extremity cellulitis and was treated with Zyvox and Keflex. He also has a right plantar foot wound that started after having callus debridement by his podiatrist. He has had this wound for at least 6 weeks. For the foot wound he has not been keeping it covered and walks around barefoot. He  wound is smaller with a little bit of slough on the surface. The left anterior tibial wound is more superficial but with essentially the same measurements this week. Slough has accumulated on the surface here, as well. Edema control remains good. 04/08/2023: While he was in the hospital for his pacemaker placement, a nurse pulled some tape off of his leg rather roughly and denuded the skin on his right thigh. He also bumped into a door frame and has a skin tear on his left elbow. The existing wounds on his legs are both smaller with just a little bit of slough on each surface. Edema control is good. Electronic Signature(s) Signed: 04/08/2023 10:59:41 AM By: Duanne Guess MD FACS Entered By: Duanne Guess on 04/08/2023 07:59:40 -------------------------------------------------------------------------------- Physical Exam Details Patient Name: Date of Service: Ursula Alert [name] L. 04/08/2023 10:15 A M Medical Record Number: [id number] Patient Account Number: [id number] Date of Birth/Sex: Treating RN: 1941/06/13 (82 y.o. M) Primary Care Provider: Seabron Spates Other Clinician: Referring Provider: Treating Provider/Extender: Verner Mould in Treatment: 6 Constitutional Hypertensive, asymptomatic. . . . no acute distress. Respiratory Normal work of breathing on room air. Notes 04/08/2023: While he was in the hospital for his pacemaker placement, a nurse pulled some tape off of his leg rather roughly and denuded the skin on his right thigh. He also bumped into a door frame and has a skin tear on his left elbow. The existing wounds on his legs are both smaller with just a little bit of slough on each surface. Edema control is good. Electronic Signature(s) Signed: 04/08/2023 11:00:22 AM  By: Duanne Guess MD FACS Entered By: Duanne Guess on 04/08/2023 08:00:22 -------------------------------------------------------------------------------- Physician Orders Details Patient Name: Date of Service: Richard [name], Richard Presto [name] L. 04/08/2023 10:15 A M Medical Record Number: [id number] Patient Account Number: [id number] Date of Birth/Sex: Treating RN: [birth year] (82 y.o. Dianna Limbo Primary Care Provider: Seabron Spates Other Clinician: Referring Provider: Treating Provider/Extender: Verner Mould in Treatment: 6 Verbal / Phone Orders: No Diagnosis Coding ICD-10 Coding Code Description [id number] Non-pressure chronic ulcer of other part of right lower leg with fat layer exposed L97.822 Non-pressure chronic ulcer of other part of left lower leg with fat layer exposed E11.622 Type 2 diabetes mellitus with other skin ulcer I50.42 Chronic combined systolic (congestive) and diastolic (congestive) heart failure CORDELRO, GAUTREAU ([id number]) [id number] Page 5 of 13 N18.32 Chronic kidney disease, stage 3b R60.0 Localized edema Follow-up Appointments ppointment in 1 week. - Dr. Lady Gary Room 04/16/23 at 10:15am Return A ppointment in 2 weeks. - Please ask front desk to schedule an appointment Return A Return appointment in 3 weeks. - Please ask front desk to schedule an appointment Return appointment in 1 month. - Please ask front desk to schedule an appointment Anesthetic (In clinic) Topical Lidocaine 5% applied to wound bed Bathing/ Shower/ Hygiene May shower and wash wound with soap and water. - Avoid getting moisture on wounds-the wraps on the Right leg Edema Control - Lymphedema / SCD / Other Bilateral Lower Extremities Elevate legs to the level of the heart or above for 30 minutes daily and/or when sitting for 3-4 times a day throughout the day. Avoid standing for long periods of time. Exercise regularly - As T  olerated Moisturize legs daily. Home Health Discontinue home health for wound care. 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 sites with fat layer exposed 04/08/2023 No Yes L97.111 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 04/08/2023 No Yes E11.622 Type 2 diabetes mellitus with other skin ulcer 02/25/2023 No Yes I50.42 Chronic combined systolic (congestive) and diastolic (congestive) heart failure 02/25/2023 No Yes N18.32 Chronic kidney disease, stage 3b 02/25/2023 No Yes R60.0 Localized edema 02/25/2023 No Yes Richard [name], Richard [name] ([id number]) [id number] Page 7 of 13 Inactive Problems ICD-10 Code Description Active Date Inactive Date E11.621 Type 2 diabetes mellitus with foot ulcer 02/25/2023 02/25/2023 Resolved Problems ICD-10 Code Description Active Date Resolved Date L97.522 Non-pressure chronic ulcer of other part of left foot with fat layer exposed 02/25/2023 02/25/2023 Electronic Signature(s) Signed: 04/08/2023 10:58:00 AM By: Duanne Guess MD FACS Previous Signature: 04/08/2023 10:47:56 AM Version By: Duanne Guess  MD FACS Entered By: Duanne Guess on 04/08/2023 07:57:59 -------------------------------------------------------------------------------- Progress Note Details Patient Name: Date of Service: Ursula Alert [name] L. 04/08/2023 10:15 A M Medical Record Number: [id number] Patient Account Number: [id number] Date of Birth/Sex: Treating RN: 14-Sep-1940 (82 y.o. M) Primary Care Provider: Seabron Spates Other Clinician: Referring Provider: Treating Provider/Extender: Verner Mould in Treatment: 6 Subjective Chief Complaint Information obtained from Patient 01/12/2021; Wounds to his left upper extremity and bilateral lower extremities. 05/14/2022; bilateral lower extremity wounds, right plantar foot wound 09/05/2022; bilateral lower extremity wounds, left thumb wound 02/25/2023: left foot DFU and right knee and RLE wounds History of Present Illness (HPI) Admission 7/15 [name] is an 82 year old male with a past medical history of type 2 diabetes on oral agents, diffuse large B-cell lymphoma, and coronary artery disease status post CABG that presents to the clinic for wounds to his left upper extremity and bilateral lower extremities. He states that he went to urgent care to be evaluated for a fever. And he was instructed to go to the ED to be further evaluated for possible sepsis. Unfortunately he passed out in his car After his appointment and was not discovered for another 3 hours. He was eventually discovered and EMS had to take him out of his vehicle. This is when he developed abrasions to his left arm and legs. He has been using Xeroform to his wound beds every other day. He reports improvement to all the wounds except for the one on the back of his right leg. He denies pain or signs of infection. 7/22; patient presents for 1 week follow-up. He has no issues or complaints today. He has tolerated the compression wrap well on his right lower extremity. He has  been able to do the dressing changes without issues to his left leg and left arm. His left arm wound has healed. He denies signs of infection. 7/29; patient presents for 1 week follow-up. He reports that his left leg wound is healed. He tolerated the compression wrap well on his right leg. He has no issues or complaints today. He denies signs of infection. 8/4; patient presents for 1 week follow-up. He continues to see improvement in wound healing to his right lower extremity. He has no issues or

## 2023-04-08 NOTE — Telephone Encounter (Signed)
Pt called stating that he has been having constipation issues every 2-3 days and he has been taking Metamucil twice a day. Pt would like to know if there is anything Dr. Laury Axon would recommend for this issue OTC.

## 2023-04-08 NOTE — Progress Notes (Addendum)
Undermining: No Wound Description Classification: Full Thickness Without Exposed Support Structures Wound Margin: Distinct, outline attached Exudate Amount: Medium Exudate Type: Sanguinous Exudate Color: red Foul Odor After Cleansing: No Slough/Fibrino Yes Wound Bed Granulation Amount: Large (67-100%) Exposed Structure Granulation Quality: Red, Hyper-granulation, Friable Fat Layer (Subcutaneous Tissue) Exposed: Yes Necrotic Amount: Small (1-33%) Necrotic Quality: Eschar, Adherent Slough Periwound Skin Texture Texture Color No  Abnormalities Noted: No No Abnormalities Noted: No Scarring: Yes Hemosiderin Staining: Yes Moisture Temperature / Pain No Abnormalities Noted: Yes Temperature: No Abnormality Treatment Notes Wound #16 (Lower Leg) Wound Laterality: Left, Anterior Cleanser Soap and Water Discharge Instruction: May shower and wash wound with dial antibacterial soap and water prior to dressing change. Vashe 5.8 (oz) Discharge Instruction: Cleanse the wound with Vashe prior to applying a clean dressing using gauze sponges, not tissue or cotton balls. Wound Cleanser Discharge Instruction: Cleanse the wound with wound cleanser prior to applying a clean dressing using gauze sponges, not tissue or cotton balls. Peri-Wound Care Topical Primary Dressing Hydrofera Blue Ready Transfer Foam, 2.5x2.5 (in/in) Discharge Instruction: Apply directly to wound bed as directed Secondary Dressing Woven Gauze Sponge, Non-Sterile 4x4 in Discharge Instruction: Apply over primary dressing as directed. Secured With Compression Wrap Urgo K2 Lite, (equivalent to a 3 layer) two layer compression system, regular Discharge Instruction: Apply Urgo K2 Lite as directed (alternative to 3 layer compression). Tubular netting #5 Compression Stockings Add-Ons Electronic Signature(s) Signed: 04/08/2023 3:53:00 PM By: Karie Schwalbe RN Previous Signature: 04/08/2023 10:37:36 AM Version By: Dayton Scrape Entered By: Karie Schwalbe on 04/08/2023 10:43:51 -------------------------------------------------------------------------------- Wound Assessment Details Patient Name: Date of Service: Richard Alert [name] 04/08/2023 10:15 A M Medical Record Number: [id number] Patient Account Number: [id number] Richard [name], Richard [name] ([id number]) [phone].pdf Page 10 of 12 Date of Birth/Sex: Treating RN: Richard [name][birth year] (82 y.o. M) Primary Care Raeghan Demeter: Other Clinician: Seabron Spates Referring Richerd Grime: Treating Sena Clouatre/Extender:  Verner Mould in Treatment: 6 Wound Status Wound Number: 17 Primary Trauma, Other Etiology: Wound Location: Right, Lateral Upper Leg Wound Open Wounding Event: Shear/Friction Status: Date Acquired: 04/01/2023 Comorbid Cataracts, Anemia, Sleep Apnea, Arrhythmia, Congestive Heart Weeks Of Treatment: 0 History: Failure, Coronary Artery Disease, Hypertension, Type II Diabetes, Clustered Wound: No Osteoarthritis, Neuropathy, Received Chemotherapy Photos Wound Measurements Length: (cm) 4 Width: (cm) 4.5 Depth: (cm) 0.1 Area: (cm) 14.137 Volume: (cm) 1.414 % Reduction in Area: % Reduction in Volume: Epithelialization: Small (1-33%) Tunneling: No Undermining: No Wound Description Classification: Full Thickness Without Exposed Support Structures Wound Margin: Distinct, outline attached Exudate Amount: Medium Exudate Type: Serosanguineous Exudate Color: red, brown Foul Odor After Cleansing: No Slough/Fibrino No Wound Bed Granulation Amount: Large (67-100%) Exposed Structure Granulation Quality: Red Fascia Exposed: No Necrotic Amount: None Present (0%) Fat Layer (Subcutaneous Tissue) Exposed: No Tendon Exposed: No Muscle Exposed: No Joint Exposed: No Bone Exposed: No Periwound Skin Texture Texture Color No Abnormalities Noted: Yes No Abnormalities Noted: Yes Moisture Temperature / Pain No Abnormalities Noted: Yes Temperature: No Abnormality Treatment Notes Wound #17 (Upper Leg) Wound Laterality: Right, Lateral Cleanser Soap and Water Discharge Instruction: May shower and wash wound with dial antibacterial soap and water prior to dressing change. Peri-Wound Care Topical Primary Dressing Xeroform Occlusive Gauze Dressing, 4x4 in Discharge Instruction: Apply to wound bed as instructed Secondary Dressing LEWI, DROST ([id number]) [phone].pdf Page 11 of 12 Bordered Gauze, 4x4 in Discharge Instruction: Apply over  primary dressing as directed. Secured With Compression Wrap Compression Stockings Facilities manager) Signed: 04/08/2023 3:53:00 PM By: Karie Schwalbe RN  Undermining: No Wound Description Classification: Full Thickness Without Exposed Support Structures Wound Margin: Distinct, outline attached Exudate Amount: Medium Exudate Type: Sanguinous Exudate Color: red Foul Odor After Cleansing: No Slough/Fibrino Yes Wound Bed Granulation Amount: Large (67-100%) Exposed Structure Granulation Quality: Red, Hyper-granulation, Friable Fat Layer (Subcutaneous Tissue) Exposed: Yes Necrotic Amount: Small (1-33%) Necrotic Quality: Eschar, Adherent Slough Periwound Skin Texture Texture Color No  Abnormalities Noted: No No Abnormalities Noted: No Scarring: Yes Hemosiderin Staining: Yes Moisture Temperature / Pain No Abnormalities Noted: Yes Temperature: No Abnormality Treatment Notes Wound #16 (Lower Leg) Wound Laterality: Left, Anterior Cleanser Soap and Water Discharge Instruction: May shower and wash wound with dial antibacterial soap and water prior to dressing change. Vashe 5.8 (oz) Discharge Instruction: Cleanse the wound with Vashe prior to applying a clean dressing using gauze sponges, not tissue or cotton balls. Wound Cleanser Discharge Instruction: Cleanse the wound with wound cleanser prior to applying a clean dressing using gauze sponges, not tissue or cotton balls. Peri-Wound Care Topical Primary Dressing Hydrofera Blue Ready Transfer Foam, 2.5x2.5 (in/in) Discharge Instruction: Apply directly to wound bed as directed Secondary Dressing Woven Gauze Sponge, Non-Sterile 4x4 in Discharge Instruction: Apply over primary dressing as directed. Secured With Compression Wrap Urgo K2 Lite, (equivalent to a 3 layer) two layer compression system, regular Discharge Instruction: Apply Urgo K2 Lite as directed (alternative to 3 layer compression). Tubular netting #5 Compression Stockings Add-Ons Electronic Signature(s) Signed: 04/08/2023 3:53:00 PM By: Karie Schwalbe RN Previous Signature: 04/08/2023 10:37:36 AM Version By: Dayton Scrape Entered By: Karie Schwalbe on 04/08/2023 10:43:51 -------------------------------------------------------------------------------- Wound Assessment Details Patient Name: Date of Service: Richard Alert [name] 04/08/2023 10:15 A M Medical Record Number: [id number] Patient Account Number: [id number] Richard [name], Richard [name] ([id number]) [phone].pdf Page 10 of 12 Date of Birth/Sex: Treating RN: Richard [name][birth year] (82 y.o. M) Primary Care Raeghan Demeter: Other Clinician: Seabron Spates Referring Richerd Grime: Treating Sena Clouatre/Extender:  Verner Mould in Treatment: 6 Wound Status Wound Number: 17 Primary Trauma, Other Etiology: Wound Location: Right, Lateral Upper Leg Wound Open Wounding Event: Shear/Friction Status: Date Acquired: 04/01/2023 Comorbid Cataracts, Anemia, Sleep Apnea, Arrhythmia, Congestive Heart Weeks Of Treatment: 0 History: Failure, Coronary Artery Disease, Hypertension, Type II Diabetes, Clustered Wound: No Osteoarthritis, Neuropathy, Received Chemotherapy Photos Wound Measurements Length: (cm) 4 Width: (cm) 4.5 Depth: (cm) 0.1 Area: (cm) 14.137 Volume: (cm) 1.414 % Reduction in Area: % Reduction in Volume: Epithelialization: Small (1-33%) Tunneling: No Undermining: No Wound Description Classification: Full Thickness Without Exposed Support Structures Wound Margin: Distinct, outline attached Exudate Amount: Medium Exudate Type: Serosanguineous Exudate Color: red, brown Foul Odor After Cleansing: No Slough/Fibrino No Wound Bed Granulation Amount: Large (67-100%) Exposed Structure Granulation Quality: Red Fascia Exposed: No Necrotic Amount: None Present (0%) Fat Layer (Subcutaneous Tissue) Exposed: No Tendon Exposed: No Muscle Exposed: No Joint Exposed: No Bone Exposed: No Periwound Skin Texture Texture Color No Abnormalities Noted: Yes No Abnormalities Noted: Yes Moisture Temperature / Pain No Abnormalities Noted: Yes Temperature: No Abnormality Treatment Notes Wound #17 (Upper Leg) Wound Laterality: Right, Lateral Cleanser Soap and Water Discharge Instruction: May shower and wash wound with dial antibacterial soap and water prior to dressing change. Peri-Wound Care Topical Primary Dressing Xeroform Occlusive Gauze Dressing, 4x4 in Discharge Instruction: Apply to wound bed as instructed Secondary Dressing LEWI, DROST ([id number]) [phone].pdf Page 11 of 12 Bordered Gauze, 4x4 in Discharge Instruction: Apply over  primary dressing as directed. Secured With Compression Wrap Compression Stockings Facilities manager) Signed: 04/08/2023 3:53:00 PM By: Karie Schwalbe RN  N/A Wound Location: Skin Tear/Laceration N/A N/A Wounding Event: Skin Tear N/A N/A Primary Etiology: Cataracts, Anemia, Sleep Apnea, N/A N/A Comorbid History: Arrhythmia,  Congestive Heart Failure, Coronary Artery Disease, Hypertension, Type II Diabetes, Osteoarthritis, Neuropathy, Received Chemotherapy 04/02/2023 N/A N/A Date Acquired: 0 N/A N/A Weeks of Treatment: Open N/A N/A Wound Status: No N/A N/A Wound Recurrence: 0.3x2x0.1 N/A N/A Measurements L x W x D (cm) 0.471 N/A N/A A (cm) : rea 0.047 N/A N/A Volume (cm) : N/A N/A N/A % Reduction in Area: N/A N/A N/A % Reduction in Volume: Full Thickness Without Exposed N/A N/A Classification: Support Structures Medium N/A N/A Exudate A mount: Serosanguineous N/A N/A Exudate Type: red, brown N/A N/A Exudate Color: Distinct, outline attached N/A N/A Wound Margin: Large (67-100%) N/A N/A Granulation A mount: Red N/A N/A Granulation Quality: Small (1-33%) N/A N/A Necrotic A mount: Eschar, Adherent Slough N/A N/A Necrotic Tissue: Fat Layer (Subcutaneous Tissue): Yes N/A N/A Exposed Structures: Small (1-33%) N/A N/A Epithelialization: N/A N/A N/A Debridement: N/A N/A N/A Pain Control: N/A N/A N/A Tissue Debrided: N/A N/A N/A Level: N/A N/A N/A Debridement A (sq cm): rea Richard [name], Richard [name] ([id number]) [id number] Page 5 of 12 N/A N/A N/A Instrument: N/A N/A N/A Bleeding: N/A N/A N/A Hemostasis A chieved: N/A N/A N/A Procedural Pain: N/A N/A N/A Post Procedural Pain: N/A N/A N/A Debridement Treatment Response: N/A N/A N/A Post Debridement Measurements L x W x D (cm) N/A N/A N/A Post Debridement Volume: (cm) No Abnormalities Noted N/A N/A Periwound Skin Texture: No Abnormalities Noted N/A N/A Periwound Skin Moisture: No Abnormalities Noted N/A N/A Periwound Skin Color: No Abnormality N/A N/A Temperature: N/A N/A N/A Procedures Performed: Treatment Notes Electronic Signature(s) Signed: 04/08/2023 10:58:15 AM By: Duanne Guess MD FACS Previous Signature: 04/08/2023 10:56:54 AM Version By: Duanne Guess MD FACS Entered By: Duanne Guess on 04/08/2023 10:58:15 -------------------------------------------------------------------------------- Multi-Disciplinary Care Plan Details Patient Name: Date of Service: Richard [name], [name] 04/08/2023 10:15 A M Medical Record Number: [id number] Patient Account Number: [id number] Date of Birth/Sex: Treating RN: Richard [name], Richard [name] (82 y.o. Richard [name] Primary Care Rily Nickey: Seabron Spates Other Clinician: Referring Jaymon Dudek: Treating An Schnabel/Extender: Verner Mould in Treatment: 6 Active Inactive Wound/Skin Impairment Nursing Diagnoses: Impaired tissue integrity Goals: Patient/caregiver will verbalize understanding of skin care regimen Date Initiated: 02/25/2023 Target Resolution Date: 05/31/2023 Goal Status: Active Interventions: Assess ulceration(s) every visit Treatment Activities: Skin care regimen initiated : 02/25/2023 Notes: Electronic Signature(s) Signed: 04/08/2023 3:53:00 PM By: Karie Schwalbe RN Entered By: Karie Schwalbe on 04/08/2023 15:45:28 -------------------------------------------------------------------------------- Pain Assessment Details Patient Name: Date of Service: Richard Alert [name] 04/08/2023 10:15 A Richard [name], Richard [name] ([id number]) [id number] Page 6 of 12 Medical Record Number: [id number] Patient Account Number: [id number] Date of Birth/Sex: Treating RN: Richard [name][birth year] (82 y.o. M) Primary Care Malikhi Ogan: Seabron Spates Other Clinician: Referring Dailynn Nancarrow: Treating Nykeria Mealing/Extender: Verner Mould in Treatment: 6 Active Problems Location of Pain Severity and Description of Pain Patient Has Paino No Site Locations Pain Management and Medication Current Pain Management: Electronic Signature(s) Signed: 04/08/2023 10:37:36 AM By: Dayton Scrape Entered By: Dayton Scrape on 04/08/2023  10:[name]:33 -------------------------------------------------------------------------------- Patient/Caregiver Education Details Patient Name: Date of Service: Richard [name] 10/8/2024andnbsp10:15 A M Medical Record Number: [id number] Patient Account Number: [id number] Date of Birth/Gender: Treating RN: Richard [name][birth year] (82 y.o. Richard [name] Primary Care Physician: Seabron Spates Other Clinician: Referring Physician: Treating Physician/Extender: Verner Mould in Treatment: 6 Education Assessment Education Provided To:  Patient Education Topics Provided Wound/Skin Impairment: Methods: Explain/Verbal Responses: State content correctly Electronic Signature(s) Signed: 04/08/2023 3:53:00 PM By: Karie Schwalbe RN Entered By: Karie Schwalbe on 04/08/2023 15:46:35 Richard [name] ([id number]) [id number] Page 7 of 12 -------------------------------------------------------------------------------- Wound Assessment Details Patient Name: Date of Service: Richard Alert [name] 04/08/2023 10:15 A M Medical Record Number: [id number] Patient Account Number: [id number] Date of Birth/Sex: Treating RN: Richard [name] (82 y.o. M) Primary Care Aviya Jarvie: Seabron Spates Other Clinician: Referring Lydie Stammen: Treating Vian Fluegel/Extender: Verner Mould in Treatment: 6 Wound Status Wound Number: 15 Primary Trauma, Other Etiology: Wound Location: Right, Medial Lower Leg Wound Open Wounding Event: Skin Tear/Laceration Status: Date Acquired: 02/05/2023 Comorbid Cataracts, Anemia, Sleep Apnea, Arrhythmia, Congestive Heart Weeks Of Treatment: 6 History: Failure, Coronary Artery Disease, Hypertension, Type II Diabetes, Clustered Wound: No Osteoarthritis, Neuropathy, Received Chemotherapy Photos Wound Measurements Length: (cm) 0.4 Width: (cm) 0.4 Depth: (cm) 0.1 Area: (cm) 0.126 Volume: (cm) 0.013 % Reduction in Area:  61.8% % Reduction in Volume: 60.6% Epithelialization: Small (1-33%) Tunneling: No Undermining: No Wound Description Classification: Full Thickness Without Exposed Support Structures Wound Margin: Fibrotic scar, thickened scar Exudate Amount: Medium Exudate Type: Serosanguineous Exudate Color: red, brown Foul Odor After Cleansing: No Slough/Fibrino Yes Wound Bed Granulation Amount: Large (67-100%) Exposed Structure Granulation Quality: Red, Hyper-granulation Fat Layer (Subcutaneous Tissue) Exposed: Yes Necrotic Amount: Small (1-33%) Necrotic Quality: Eschar, Adherent Slough Periwound Skin Texture Texture Color No Abnormalities Noted: Yes No Abnormalities Noted: Yes Moisture Temperature / Pain No Abnormalities Noted: Yes Temperature: No Abnormality Treatment Notes Wound #15 (Lower Leg) Wound Laterality: Right, Medial Cleanser Soap and Water Discharge Instruction: May shower and wash wound with dial antibacterial soap and water prior to dressing change. Vashe 5.8 (oz) Discharge Instruction: Cleanse the wound with Vashe prior to applying a clean dressing using gauze sponges, not tissue or cotton balls. Richard [name], Richard [name] ([id number]) [id number] Page 8 of 12 Wound Cleanser Discharge Instruction: Cleanse the wound with wound cleanser prior to applying a clean dressing using gauze sponges, not tissue or cotton balls. Peri-Wound Care Topical Primary Dressing Hydrofera Blue Ready Transfer Foam, 2.5x2.5 (in/in) Discharge Instruction: Apply directly to wound bed as directed Secondary Dressing Woven Gauze Sponge, Non-Sterile 4x4 in Discharge Instruction: Apply over primary dressing as directed. Secured With Compression Wrap Urgo K2 Lite, (equivalent to a 3 layer) two layer compression system, regular Discharge Instruction: Apply Urgo K2 Lite as directed (alternative to 3 layer compression). Tubular netting #5 Compression Stockings Add-Ons Electronic  Signature(s) Signed: 04/08/2023 3:53:00 PM By: Karie Schwalbe RN Previous Signature: 04/08/2023 10:37:36 AM Version By: [place] Scrape Entered By: Karie Schwalbe on 04/08/2023 10:45:37 -------------------------------------------------------------------------------- Wound Assessment Details Patient Name: Date of Service: Richard Alert [name] 04/08/2023 10:15 A M Medical Record Number: [id number] Patient Account Number: [id number] Date of Birth/Sex: Treating RN: Richard [name], Richard [name] (82 y.o. M) Primary Care Eleaner Dibartolo: Seabron Spates Other Clinician: Referring Graysen Woodyard: Treating Matai Carpenito/Extender: Verner Mould in Treatment: 6 Wound Status Wound Number: 16 Primary Trauma, Other Etiology: Wound Location: Left, Anterior Lower Leg Wound Open Wounding Event: Hematoma Status: Date Acquired: 03/08/2023 Comorbid Cataracts, Anemia, Sleep Apnea, Arrhythmia, Congestive Heart Weeks Of Treatment: 4 History: Failure, Coronary Artery Disease, Hypertension, Type II Diabetes, Clustered Wound: No Osteoarthritis, Neuropathy, Received Chemotherapy Photos Wound Measurements Length: (cm) 1.5 Width: (cm) 1.5 Depth: (cm) 0.1 Area: (cm) 1.767 Richard [name], Richard [name] ([id number]) Volume: (cm) 0.177 % Reduction in Area: 62.5% % Reduction in Volume: 62.4% Epithelialization: Small (1-33%) Tunneling: No [id number] Page 9 of 12  Patient Education Topics Provided Wound/Skin Impairment: Methods: Explain/Verbal Responses: State content correctly Electronic Signature(s) Signed: 04/08/2023 3:53:00 PM By: Karie Schwalbe RN Entered By: Karie Schwalbe on 04/08/2023 15:46:35 Richard [name] ([id number]) [id number] Page 7 of 12 -------------------------------------------------------------------------------- Wound Assessment Details Patient Name: Date of Service: Richard Alert [name] 04/08/2023 10:15 A M Medical Record Number: [id number] Patient Account Number: [id number] Date of Birth/Sex: Treating RN: Richard [name] (82 y.o. M) Primary Care Aviya Jarvie: Seabron Spates Other Clinician: Referring Lydie Stammen: Treating Vian Fluegel/Extender: Verner Mould in Treatment: 6 Wound Status Wound Number: 15 Primary Trauma, Other Etiology: Wound Location: Right, Medial Lower Leg Wound Open Wounding Event: Skin Tear/Laceration Status: Date Acquired: 02/05/2023 Comorbid Cataracts, Anemia, Sleep Apnea, Arrhythmia, Congestive Heart Weeks Of Treatment: 6 History: Failure, Coronary Artery Disease, Hypertension, Type II Diabetes, Clustered Wound: No Osteoarthritis, Neuropathy, Received Chemotherapy Photos Wound Measurements Length: (cm) 0.4 Width: (cm) 0.4 Depth: (cm) 0.1 Area: (cm) 0.126 Volume: (cm) 0.013 % Reduction in Area:  61.8% % Reduction in Volume: 60.6% Epithelialization: Small (1-33%) Tunneling: No Undermining: No Wound Description Classification: Full Thickness Without Exposed Support Structures Wound Margin: Fibrotic scar, thickened scar Exudate Amount: Medium Exudate Type: Serosanguineous Exudate Color: red, brown Foul Odor After Cleansing: No Slough/Fibrino Yes Wound Bed Granulation Amount: Large (67-100%) Exposed Structure Granulation Quality: Red, Hyper-granulation Fat Layer (Subcutaneous Tissue) Exposed: Yes Necrotic Amount: Small (1-33%) Necrotic Quality: Eschar, Adherent Slough Periwound Skin Texture Texture Color No Abnormalities Noted: Yes No Abnormalities Noted: Yes Moisture Temperature / Pain No Abnormalities Noted: Yes Temperature: No Abnormality Treatment Notes Wound #15 (Lower Leg) Wound Laterality: Right, Medial Cleanser Soap and Water Discharge Instruction: May shower and wash wound with dial antibacterial soap and water prior to dressing change. Vashe 5.8 (oz) Discharge Instruction: Cleanse the wound with Vashe prior to applying a clean dressing using gauze sponges, not tissue or cotton balls. Richard [name], Richard [name] ([id number]) [id number] Page 8 of 12 Wound Cleanser Discharge Instruction: Cleanse the wound with wound cleanser prior to applying a clean dressing using gauze sponges, not tissue or cotton balls. Peri-Wound Care Topical Primary Dressing Hydrofera Blue Ready Transfer Foam, 2.5x2.5 (in/in) Discharge Instruction: Apply directly to wound bed as directed Secondary Dressing Woven Gauze Sponge, Non-Sterile 4x4 in Discharge Instruction: Apply over primary dressing as directed. Secured With Compression Wrap Urgo K2 Lite, (equivalent to a 3 layer) two layer compression system, regular Discharge Instruction: Apply Urgo K2 Lite as directed (alternative to 3 layer compression). Tubular netting #5 Compression Stockings Add-Ons Electronic  Signature(s) Signed: 04/08/2023 3:53:00 PM By: Karie Schwalbe RN Previous Signature: 04/08/2023 10:37:36 AM Version By: Dayton Scrape Entered By: Karie Schwalbe on 04/08/2023 10:45:37 -------------------------------------------------------------------------------- Wound Assessment Details Patient Name: Date of Service: Richard Alert [name] 04/08/2023 10:15 A M Medical Record Number: [id number] Patient Account Number: [id number] Date of Birth/Sex: Treating RN: Richard [name], Richard [name] (82 y.o. M) Primary Care Eleaner Dibartolo: Seabron Spates Other Clinician: Referring Graysen Woodyard: Treating Matai Carpenito/Extender: Verner Mould in Treatment: 6 Wound Status Wound Number: 16 Primary Trauma, Other Etiology: Wound Location: Left, Anterior Lower Leg Wound Open Wounding Event: Hematoma Status: Date Acquired: 03/08/2023 Comorbid Cataracts, Anemia, Sleep Apnea, Arrhythmia, Congestive Heart Weeks Of Treatment: 4 History: Failure, Coronary Artery Disease, Hypertension, Type II Diabetes, Clustered Wound: No Osteoarthritis, Neuropathy, Received Chemotherapy Photos Wound Measurements Length: (cm) 1.5 Width: (cm) 1.5 Depth: (cm) 0.1 Area: (cm) 1.767 Richard [name], Richard [name] ([id number]) Volume: (cm) 0.177 % Reduction in Area: 62.5% % Reduction in Volume: 62.4% Epithelialization: Small (1-33%) Tunneling: No [id number] Page 9 of 12  Patient Education Topics Provided Wound/Skin Impairment: Methods: Explain/Verbal Responses: State content correctly Electronic Signature(s) Signed: 04/08/2023 3:53:00 PM By: Karie Schwalbe RN Entered By: Karie Schwalbe on 04/08/2023 15:46:35 Richard [name] ([id number]) [id number] Page 7 of 12 -------------------------------------------------------------------------------- Wound Assessment Details Patient Name: Date of Service: Richard Alert [name] 04/08/2023 10:15 A M Medical Record Number: [id number] Patient Account Number: [id number] Date of Birth/Sex: Treating RN: Richard [name] (82 y.o. M) Primary Care Aviya Jarvie: Seabron Spates Other Clinician: Referring Lydie Stammen: Treating Vian Fluegel/Extender: Verner Mould in Treatment: 6 Wound Status Wound Number: 15 Primary Trauma, Other Etiology: Wound Location: Right, Medial Lower Leg Wound Open Wounding Event: Skin Tear/Laceration Status: Date Acquired: 02/05/2023 Comorbid Cataracts, Anemia, Sleep Apnea, Arrhythmia, Congestive Heart Weeks Of Treatment: 6 History: Failure, Coronary Artery Disease, Hypertension, Type II Diabetes, Clustered Wound: No Osteoarthritis, Neuropathy, Received Chemotherapy Photos Wound Measurements Length: (cm) 0.4 Width: (cm) 0.4 Depth: (cm) 0.1 Area: (cm) 0.126 Volume: (cm) 0.013 % Reduction in Area:  61.8% % Reduction in Volume: 60.6% Epithelialization: Small (1-33%) Tunneling: No Undermining: No Wound Description Classification: Full Thickness Without Exposed Support Structures Wound Margin: Fibrotic scar, thickened scar Exudate Amount: Medium Exudate Type: Serosanguineous Exudate Color: red, brown Foul Odor After Cleansing: No Slough/Fibrino Yes Wound Bed Granulation Amount: Large (67-100%) Exposed Structure Granulation Quality: Red, Hyper-granulation Fat Layer (Subcutaneous Tissue) Exposed: Yes Necrotic Amount: Small (1-33%) Necrotic Quality: Eschar, Adherent Slough Periwound Skin Texture Texture Color No Abnormalities Noted: Yes No Abnormalities Noted: Yes Moisture Temperature / Pain No Abnormalities Noted: Yes Temperature: No Abnormality Treatment Notes Wound #15 (Lower Leg) Wound Laterality: Right, Medial Cleanser Soap and Water Discharge Instruction: May shower and wash wound with dial antibacterial soap and water prior to dressing change. Vashe 5.8 (oz) Discharge Instruction: Cleanse the wound with Vashe prior to applying a clean dressing using gauze sponges, not tissue or cotton balls. Richard [name], Richard [name] ([id number]) [id number] Page 8 of 12 Wound Cleanser Discharge Instruction: Cleanse the wound with wound cleanser prior to applying a clean dressing using gauze sponges, not tissue or cotton balls. Peri-Wound Care Topical Primary Dressing Hydrofera Blue Ready Transfer Foam, 2.5x2.5 (in/in) Discharge Instruction: Apply directly to wound bed as directed Secondary Dressing Woven Gauze Sponge, Non-Sterile 4x4 in Discharge Instruction: Apply over primary dressing as directed. Secured With Compression Wrap Urgo K2 Lite, (equivalent to a 3 layer) two layer compression system, regular Discharge Instruction: Apply Urgo K2 Lite as directed (alternative to 3 layer compression). Tubular netting #5 Compression Stockings Add-Ons Electronic  Signature(s) Signed: 04/08/2023 3:53:00 PM By: Karie Schwalbe RN Previous Signature: 04/08/2023 10:37:36 AM Version By: [place] Scrape Entered By: Karie Schwalbe on 04/08/2023 10:45:37 -------------------------------------------------------------------------------- Wound Assessment Details Patient Name: Date of Service: Richard Alert [name] 04/08/2023 10:15 A M Medical Record Number: [id number] Patient Account Number: [id number] Date of Birth/Sex: Treating RN: Richard [name], Richard [name] (82 y.o. M) Primary Care Eleaner Dibartolo: Seabron Spates Other Clinician: Referring Graysen Woodyard: Treating Matai Carpenito/Extender: Verner Mould in Treatment: 6 Wound Status Wound Number: 16 Primary Trauma, Other Etiology: Wound Location: Left, Anterior Lower Leg Wound Open Wounding Event: Hematoma Status: Date Acquired: 03/08/2023 Comorbid Cataracts, Anemia, Sleep Apnea, Arrhythmia, Congestive Heart Weeks Of Treatment: 4 History: Failure, Coronary Artery Disease, Hypertension, Type II Diabetes, Clustered Wound: No Osteoarthritis, Neuropathy, Received Chemotherapy Photos Wound Measurements Length: (cm) 1.5 Width: (cm) 1.5 Depth: (cm) 0.1 Area: (cm) 1.767 Richard [name], Richard [name] ([id number]) Volume: (cm) 0.177 % Reduction in Area: 62.5% % Reduction in Volume: 62.4% Epithelialization: Small (1-33%) Tunneling: No [id number] Page 9 of 12

## 2023-04-09 NOTE — Telephone Encounter (Signed)
Pt made aware. Pt also has appt with [facility] tomorrow

## 2023-04-10 ENCOUNTER — Telehealth: Payer: Self-pay | Admitting: Family Medicine

## 2023-04-10 ENCOUNTER — Ambulatory Visit: Payer: Medicare Other | Admitting: Family Medicine

## 2023-04-10 ENCOUNTER — Encounter: Payer: Self-pay | Admitting: Family Medicine

## 2023-04-10 VITALS — BP 130/78 | HR 72 | Temp 97.7°F | Resp 18 | Ht 72.0 in | Wt 232.4 lb

## 2023-04-10 DIAGNOSIS — E1169 Type 2 diabetes mellitus with other specified complication: Secondary | ICD-10-CM

## 2023-04-10 DIAGNOSIS — I5042 Chronic combined systolic (congestive) and diastolic (congestive) heart failure: Secondary | ICD-10-CM | POA: Diagnosis not present

## 2023-04-10 DIAGNOSIS — I129 Hypertensive chronic kidney disease with stage 1 through stage 4 chronic kidney disease, or unspecified chronic kidney disease: Secondary | ICD-10-CM | POA: Diagnosis not present

## 2023-04-10 DIAGNOSIS — N183 Chronic kidney disease, stage 3 unspecified: Secondary | ICD-10-CM

## 2023-04-10 DIAGNOSIS — I25118 Atherosclerotic heart disease of native coronary artery with other forms of angina pectoris: Secondary | ICD-10-CM | POA: Diagnosis not present

## 2023-04-10 DIAGNOSIS — E1165 Type 2 diabetes mellitus with hyperglycemia: Secondary | ICD-10-CM

## 2023-04-10 DIAGNOSIS — I10 Essential (primary) hypertension: Secondary | ICD-10-CM | POA: Diagnosis not present

## 2023-04-10 DIAGNOSIS — N1832 Chronic kidney disease, stage 3b: Secondary | ICD-10-CM | POA: Diagnosis not present

## 2023-04-10 DIAGNOSIS — E785 Hyperlipidemia, unspecified: Secondary | ICD-10-CM

## 2023-04-10 DIAGNOSIS — D500 Iron deficiency anemia secondary to blood loss (chronic): Secondary | ICD-10-CM

## 2023-04-10 DIAGNOSIS — I350 Nonrheumatic aortic (valve) stenosis: Secondary | ICD-10-CM | POA: Diagnosis not present

## 2023-04-10 DIAGNOSIS — E1122 Type 2 diabetes mellitus with diabetic chronic kidney disease: Secondary | ICD-10-CM | POA: Diagnosis not present

## 2023-04-10 DIAGNOSIS — I4821 Permanent atrial fibrillation: Secondary | ICD-10-CM

## 2023-04-10 DIAGNOSIS — J029 Acute pharyngitis, unspecified: Secondary | ICD-10-CM

## 2023-04-10 LAB — LIPID PANEL
Cholesterol: 182 mg/dL (ref 0–200)
HDL: 32.5 mg/dL — ABNORMAL LOW (ref >39.00)
LDL Cholesterol: 80 mg/dL (ref 0–99)
NonHDL: 149.43
Total CHOL/HDL Ratio: 6
Triglycerides: 347 mg/dL — ABNORMAL HIGH (ref 0.0–149.0)
VLDL: 69.4 mg/dL — ABNORMAL HIGH (ref 0.0–40.0)

## 2023-04-10 LAB — HEMOGLOBIN A1C: Hgb A1c MFr Bld: 9.1 % — ABNORMAL HIGH (ref 4.6–6.5)

## 2023-04-10 LAB — COMPREHENSIVE METABOLIC PANEL
ALT: 23 U/L (ref 0–53)
AST: 21 U/L (ref 0–37)
Albumin: 4.5 g/dL (ref 3.5–5.2)
Alkaline Phosphatase: 69 U/L (ref 39–117)
BUN: 39 mg/dL — ABNORMAL HIGH (ref 6–23)
CO2: 32 meq/L (ref 19–32)
Calcium: 10.4 mg/dL (ref 8.4–10.5)
Chloride: 98 meq/L (ref 96–112)
Creatinine, Ser: 1.53 mg/dL — ABNORMAL HIGH (ref 0.40–1.50)
GFR: 41.43 mL/min — ABNORMAL LOW (ref >60.00)
Glucose, Bld: 138 mg/dL — ABNORMAL HIGH (ref 70–99)
Potassium: 4.5 meq/L (ref 3.5–5.1)
Sodium: 141 meq/L (ref 135–145)
Total Bilirubin: 0.8 mg/dL (ref 0.2–1.2)
Total Protein: 6.8 g/dL (ref 6.0–8.3)

## 2023-04-10 LAB — CBC WITH DIFFERENTIAL/PLATELET
Basophils Absolute: 0 10*3/uL (ref 0.0–0.1)
Basophils Relative: 0.5 % (ref 0.0–3.0)
Eosinophils Absolute: 0.1 10*3/uL (ref 0.0–0.7)
Eosinophils Relative: 1.3 % (ref 0.0–5.0)
HCT: 45.5 % (ref 39.0–52.0)
Hemoglobin: 14.6 g/dL (ref 13.0–17.0)
Lymphocytes Relative: 23.3 % (ref 12.0–46.0)
Lymphs Abs: 2.2 10*3/uL (ref 0.7–4.0)
MCHC: 32 g/dL (ref 30.0–36.0)
MCV: 90.8 fL (ref 78.0–100.0)
Monocytes Absolute: 0.8 10*3/uL (ref 0.1–1.0)
Monocytes Relative: 8.1 % (ref 3.0–12.0)
Neutro Abs: 6.2 10*3/uL (ref 1.4–7.7)
Neutrophils Relative %: 66.8 % (ref 43.0–77.0)
Platelets: 253 10*3/uL (ref 150.0–400.0)
RBC: 5.01 Mil/uL (ref 4.22–5.81)
RDW: 17.8 % — ABNORMAL HIGH (ref 11.5–15.5)
WBC: 9.3 10*3/uL (ref 4.0–10.5)

## 2023-04-10 LAB — MICROALBUMIN / CREATININE URINE RATIO
Creatinine,U: 20.6 mg/dL
Microalb Creat Ratio: 3.4 mg/g (ref 0.0–30.0)
Microalb, Ur: <0.7 mg/dL (ref 0.0–1.9)

## 2023-04-10 NOTE — Assessment & Plan Note (Signed)
New pacemaker put in  Per cardiology

## 2023-04-10 NOTE — Telephone Encounter (Signed)
Called pharmacy. Pharmacy verified patient was only given RSV, not flu. Chart updated

## 2023-04-10 NOTE — Assessment & Plan Note (Signed)
hgba1c to be checked, minimize simple carbs. Increase exercise as tolerated. Continue current meds  

## 2023-04-10 NOTE — Assessment & Plan Note (Signed)
Check labs  Hgba1c to be checked , minimize simple carbs. Increase exercise as tolerated. Continue current meds

## 2023-04-10 NOTE — Assessment & Plan Note (Signed)
Per nephrology 

## 2023-04-10 NOTE — Assessment & Plan Note (Signed)
Well controlled, no changes to meds. Encouraged heart healthy diet such as the DASH diet and exercise as tolerated.  °

## 2023-04-10 NOTE — Progress Notes (Signed)
Established Patient Office Visit  Subjective   Patient ID: Richard [name], male    DOB: [birth year]  Age: 82 y.o. MRN: [id number]  Chief Complaint  Patient presents with   Diabetes   Follow-up    HPI Discussed the use of AI scribe software for clinical note transcription with the patient, who gave verbal consent to proceed.  History of Present Illness   The patient, with a history of heart failure, pacemaker, and chronic leg wounds, presents for follow-up. He has been receiving treatment at a wound clinic for several weeks due to leg wounds caused by bumping into chairs. The wounds are healing well with the help of compression stockings and medication from the wound clinic.  The patient recently underwent a pacemaker replacement due to the previous pacemaker's inefficiency, which was causing heart failure. The procedure was performed through the groin, and the patient was in the operating room for four hours and in recovery for four hours. He was kept overnight for observation. The patient reports feeling good post-procedure and is optimistic about the new pacemaker's potential to improve his energy levels.  In addition to the leg wounds and pacemaker replacement, the patient has been experiencing a sore throat since his hospital stay, which he believes may be due to intubation. He has been using a spray provided by the hospital to manage the discomfort, but the soreness persists.  The patient also reports constipation, which has been an ongoing issue for the past couple of weeks. He has been able to have bowel movements, but it has been a struggle. He was on antibiotics during his hospital stay, which he thought might cause diarrhea, but this has not been the case. He has not yet tried Miralax for the constipation.  The patient also mentions a friend's recommendation to try chocolate premier protein shakes for increased energy. He is considering this but wants to ensure it won't interfere  with his current medications or health conditions.      Patient Active Problem List   Diagnosis Date Noted   Permanent atrial fibrillation (HCC) 10/23/2022   Simple chronic bronchitis (HCC) 06/03/2022   Cellulitis of lower extremity 04/30/2022   Type 2 DM with CKD stage 3 and hypertension (HCC) 04/30/2022   Cellulitis of right leg 04/24/2022   Stage 3b chronic kidney disease (CKD) (HCC) 04/24/2022   Left upper quadrant pain 04/08/2022   Subacute cough 04/08/2022   Chronic combined systolic and diastolic heart failure (HCC) 02/06/2022   Acute exacerbation of CHF (congestive heart failure) (HCC) 01/16/2022   Hemoptysis 12/26/2021   Subclavian vein stenosis 12/23/2021   Strep throat 11/16/2021   Multiple superficial wounds with infection 01/04/2021   Cardiac pacemaker in situ 11/20/2020   S/P CABG x 4 11/20/2020   Ascending aortic aneurysm (HCC) 11/20/2020   Senile purpura (HCC) 07/27/2020   Porokeratosis 06/20/2020   Pain due to onychomycosis of toenails of both feet 12/14/2019   Educated about COVID-19 virus infection 11/25/2019   DM2 (diabetes mellitus, type 2) (HCC) 08/10/2019   Hyperlipidemia associated with type 2 diabetes mellitus (HCC) 08/10/2019   S/P left TKA 06/29/2019   Peripheral neuropathy 07/27/2018   Uncontrolled type 2 diabetes mellitus with hyperglycemia (HCC) 05/13/2018   Diabetic peripheral neuropathy associated with type 2 diabetes mellitus (HCC) 05/13/2018   Pansinusitis 05/13/2018   Severe aortic stenosis    Complete heart block (HCC)    Paroxysmal atrial fibrillation (HCC)    Acute on chronic diastolic CHF (congestive heart failure),  NYHA class 2 (HCC)    Left arm swelling    Status post transcatheter aortic valve replacement (TAVR) using bioprosthesis 09/09/2017   Demand ischemia of myocardium (HCC)    Typical atrial flutter (HCC)    Acute on chronic diastolic heart failure (HCC)    GIB (gastrointestinal bleeding) 08/16/2017   Hyperlipidemia 08/05/2017    NSTEMI (non-ST elevated myocardial infarction) (HCC)    Aortic stenosis, severe 07/25/2017   Pancytopenia (HCC) 07/24/2017   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) 02/27/2017   Bradycardia 01/04/2017   Coronary artery disease 01/04/2017   Stenosis of carotid artery 01/04/2017   Obesity 09/18/2016   Wenckebach block    Disorder associated with type II diabetes mellitus (HCC) 05/21/2013   Spinal stenosis of lumbar region 04/08/2013   Anemia, iron deficiency 11/29/2011   B12 deficiency anemia 07/30/2011   OSA (obstructive sleep apnea) 07/06/2010   CAD, ARTERY BYPASS GRAFT 08/22/2009   Essential hypertension 03/29/2009   Bilateral lower extremity edema 02/21/2009   Hyperlipidemia LDL goal <70 11/26/2008   GOUT 08/17/2006   Past Medical History:  Diagnosis Date   Anemia    Arthritis    hips   Axillary adenopathy 02/25/2017   Bradycardia    a. holter monitor has demonstrated HRs in 30s and Weinkibach    CAD (coronary artery disease)    a. s/p CABG 2001  b.  07/28/2017 cath:   Severe three-vessel native CAD with total occlusion of LAD, ramus intermedius, first OM and RCA, patent RIMA to PDA, LIMA to LAD, sequential SVG to ramus intermedius and first OM.     Chronic lower back pain    Diffuse large B cell lymphoma (HCC)    Diverticulosis    Esophageal stricture    GERD (gastroesophageal reflux disease)    History of gout    HTN (hypertension)    Mixed hyperlipidemia    OSA on CPAP    with 2L O2 at night   Pancytopenia (HCC)    a. related to chemo therapy for B cell lymphoma   Peptic stricture of esophagus    Presence of permanent cardiac pacemaker    sees Dr. Clayborn Bigness pacemaker   SCC (squamous cell carcinoma) 01/31/2021   R zygoma, ED[facility]   SCC (squamous cell carcinoma) 01/31/2021   L post ankle, ED[facility]   SCC (squamous cell carcinoma) 01/31/2021   L popliteal, ED[facility]   Severe aortic stenosis    Spinal stenosis    Squamous cell carcinoma of skin  03/22/2009   Right mandible. SCCis, hypertrophic.    Squamous cell carcinoma of skin 12/25/2021   R lateral cheek, EDC   Squamous cell carcinoma of skin 12/25/2021   R postauricular neck, EDC   Squamous cell carcinoma of skin 12/25/2021   R forearm sup, EDC   Squamous cell carcinoma of skin 12/25/2021   R wrist, EDC   Type II diabetes mellitus (HCC)    Wears dentures    partial upper   Past Surgical History:  Procedure Laterality Date   APPENDECTOMY  ~ 1952   BACK SURGERY     CATARACT EXTRACTION W/PHACO Left 11/06/2016   Procedure: CATARACT EXTRACTION PHACO AND INTRAOCULAR LENS PLACEMENT (IOC);  Surgeon: Sallee Lange, MD;  Location: [facility] ORS;  Service: Ophthalmology;  Laterality: Left;  Lot # [id number] H [place]: 01:09.4 AP%:25.2 CDE: 30.64   CATARACT EXTRACTION W/PHACO Right 12/04/2016   Procedure: CATARACT EXTRACTION PHACO AND INTRAOCULAR LENS PLACEMENT (IOC);  Surgeon: Sallee Lange, MD;  Location:  [facility] ORS;  Service: Ophthalmology;  Laterality: Right;  [place] 1:25.9 AP% 24.1 CDE 39.10 Fluid Pack lot # [id number]   COLONOSCOPY W/ BIOPSIES AND POLYPECTOMY  07/02/2011   CORONARY ANGIOPLASTY  07/02/1991   CORONARY ANGIOPLASTY WITH STENT PLACEMENT  05/31/1997   "1"   CORONARY ARTERY BYPASS GRAFT  03/01/2000   "CABG X5"   ECTROPION REPAIR Right 09/01/2018   Procedure: REPAIR OF ECTROPION BILATERAL upper and lower;  Surgeon: Imagene Riches, MD;  Location: [facility];  Service: Ophthalmology;  Laterality: Right;  Diabetic - oral meds sleep apnea   ESOPHAGEAL DILATION  X 3-4   Dr. Victorino Dike; "last one was in the 1990's"   ESOPHAGOGASTRODUODENOSCOPY     multiple   FLEXIBLE SIGMOIDOSCOPY     multiple   HYDRADENITIS EXCISION Left 02/25/2017   Procedure: EXCISION DEEP LEFT AXILLARY LYMPH NODE;  Surgeon: Claud Kelp, MD;  Location: [facility] OR;  Service: General;  Laterality: Left;   INTRAOPERATIVE TRANSTHORACIC ECHOCARDIOGRAM N/A 09/09/2017   Procedure: INTRAOPERATIVE  TRANSTHORACIC ECHOCARDIOGRAM;  Surgeon: Kathleene Hazel, MD;  Location: [facility] OR;  Service: Open Heart Surgery;  Laterality: N/A;   KNEE ARTHROSCOPY Left 07/01/2009   meniscus repair   LEFT HEART CATHETERIZATION WITH CORONARY/GRAFT ANGIOGRAM N/A 03/16/2014   Procedure: LEFT HEART CATHETERIZATION WITH Isabel Caprice;  Surgeon: Kathleene Hazel, MD;  Location: [facility] CATH LAB;  Service: Cardiovascular;  Laterality: N/A;   LUMBAR LAMINECTOMY/DECOMPRESSION MICRODISCECTOMY Right 06/17/2013   Procedure: LUMBAR LAMINECTOMY MICRODISCECTOMY L4-L5 RIGHT EXCISION OF SYNOVIAL CYST RIGHT   (1 LEVEL) RIGHT PARTIAL FACETECTOMY;  Surgeon: Jacki Cones, MD;  Location: [facility] ORS;  Service: Orthopedics;  Laterality: Right;   MYELOGRAM  04/06/2013   lumbar, Dr Darrelyn Hillock   ORBITAL LESION EXCISION Right 09/01/2018   Procedure: ORBITOTOMY WITHOUT BONE FLAP WITH REMOVAL OF LESION RIGHT;  Surgeon: Imagene Riches, MD;  Location: [facility];  Service: Ophthalmology;  Laterality: Right;   PACEMAKER IMPLANT N/A 09/10/2017   Procedure: PACEMAKER IMPLANT;  Surgeon: Duke Salvia, MD;  Location: [facility] INVASIVE CV LAB;  Service: Cardiovascular;  Laterality: N/A;   PANENDOSCOPY     PERIPHERAL VASCULAR BALLOON ANGIOPLASTY Left 03/16/2021   Procedure: PERIPHERAL VASCULAR BALLOON ANGIOPLASTY;  Surgeon: Leonie Douglas, MD;  Location: [facility] INVASIVE CV LAB;  Service: Cardiovascular;  Laterality: Left;  subclavian  vein   PORTACATH PLACEMENT N/A 03/06/2017   Procedure: INSERTION PORT-A-CATH AND ASPIRATE SEROMA LEFT AXILLA;  Surgeon: Claud Kelp, MD;  Location: [facility] OR;  Service: General;  Laterality: N/A;   RIGHT/LEFT HEART CATH AND CORONARY/GRAFT ANGIOGRAPHY N/A 07/28/2017   Procedure: RIGHT/LEFT HEART CATH AND CORONARY/GRAFT ANGIOGRAPHY;  Surgeon: Tonny Bollman, MD;  Location: [facility] INVASIVE CV LAB;  Service: Cardiovascular;  Laterality: N/A;   SHOULDER SURGERY Right 08/30/2010   screws placed; "tendons tore  off"   SKIN CANCER EXCISION Right    "neck"   TONSILLECTOMY  ~ 1954   TOTAL KNEE ARTHROPLASTY Left 06/29/2019   Procedure: TOTAL KNEE ARTHROPLASTY;  Surgeon: Durene Romans, MD;  Location: [facility] ORS;  Service: Orthopedics;  Laterality: Left;  70 mins   TRANSCATHETER AORTIC VALVE REPLACEMENT, TRANSFEMORAL N/A 09/09/2017   Procedure: TRANSCATHETER AORTIC VALVE REPLACEMENT, TRANSFEMORAL;  Surgeon: Kathleene Hazel, MD;  Location: [facility] OR;  Service: Open Heart Surgery;  Laterality: N/A;   UPPER EXTREMITY VENOGRAPHY Left 02/15/2021   Procedure: CENTRAL VENO;  Surgeon: Leonie Douglas, MD;  Location: [facility] INVASIVE CV LAB;  Service: Cardiovascular;  Laterality: Left;   UPPER EXTREMITY VENOGRAPHY  N/A 03/16/2021   Procedure: UPPER EXTREMITY VENOGRAPHY;  Surgeon: Leonie Douglas, MD;  Location: [facility] INVASIVE CV LAB;  Service: Cardiovascular;  Laterality: N/A;   UPPER GI ENDOSCOPY  07/02/2011   Gastritis; Dr Leone Payor   VASECTOMY     wireless pacemaker placed     Social History   Tobacco Use   Smoking status: Never   Smokeless tobacco: Never  Vaping Use   Vaping status: Never Used  Substance Use Topics   Alcohol use: No    Alcohol/week: 0.0 standard drinks of alcohol    Comment: "last drink was in 2012"( 03/15/2014)   Drug use: No   Social History   Socioeconomic History   Marital status: Divorced    Spouse name: Not on file   Number of children: 2   Years of education: college   Highest education level: Not on file  Occupational History    Employer: RETIRED  Tobacco Use   Smoking status: Never   Smokeless tobacco: Never  Vaping Use   Vaping status: Never Used  Substance and Sexual Activity   Alcohol use: No    Alcohol/week: 0.0 standard drinks of alcohol    Comment: "last drink was in 2012"( 03/15/2014)   Drug use: No   Sexual activity: Not Currently  Other Topics Concern   Not on file  Social History Narrative   ** Merged History Encounter **       Divorced, lives with a  roommate. 1 son one daughter 3-4 caffeinated beverages daily Right-handed. 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   Social Determinants of Health   Financial Resource Strain: Low Risk  (02/11/2022)   Overall Financial Resource Strain (CARDIA)    Difficulty of Paying Living Expenses: Not very hard  Food Insecurity: No Food Insecurity (04/25/2022)   Hunger Vital Sign    Worried About Running Out of Food in the Last Year: Never true    Ran Out of Food in the Last Year: Never true  Transportation Needs: No Transportation Needs (04/25/2022)   PRAPARE - Administrator, Civil Service (Medical): No    Lack of Transportation (Non-Medical): No  Physical Activity: Insufficiently Active (02/11/2022)   Exercise Vital Sign    Days of Exercise per Week: 5 days    Minutes of Exercise per Session: 10 min  Stress: No Stress Concern Present (12/24/2020)   Received from [facility] - Occupational Stress Questionnaire    Feeling of Stress : Not at all  Social Connections: Unknown (11/13/2021)   Received from [facility]   Social Network    Social Network: Not on file  Recent Concern: Social Connections - Moderately Isolated (09/06/2021)   Social Connection and Isolation Panel [NHANES]    Frequency of Communication with Friends and Family: More than three times a week    Frequency of Social Gatherings with Friends and Family: More than three times a week    Attends Religious Services: More than 4 times per year    Active Member of [employer] or Organizations: No    Attends Banker Meetings: Never    Marital Status: Divorced  Catering manager Violence: Not At Risk (04/25/2022)   Humiliation, Afraid, Rape, and Kick questionnaire    Fear of Current or Ex-Partner: No    Emotionally  Abused: No    Physically Abused: No    Sexually Abused: No   Family Status  Relation Name Status   Father  Deceased   Mother  Deceased   MGM  Deceased   PGM  Deceased   MGF  Deceased   PGF  Deceased   Neg Hx  (Not Specified)  No partnership data on file   Family History  Problem Relation Age of Onset   Stroke Father    Hypertension Father    Pancreatic cancer Mother    Diabetes Maternal Grandmother    Stroke Maternal Grandmother    Heart attack Paternal Grandmother    Colon cancer Neg Hx    Esophageal cancer Neg Hx    Rectal cancer Neg Hx    Stomach cancer Neg Hx    Ulcers Neg Hx    Allergies  Allergen Reactions   Ace Inhibitors Swelling and Other (See Comments)    Angioedema    Benazepril Swelling and Other (See Comments)    Angioedema; he is not a candidate for any angiotensin receptor blockers because of this significant allergic reaction. Because of a history of documented adverse serious drug reaction;Medi Alert bracelet  is recommended   Entresto [Sacubitril-Valsartan] Swelling and Other (See Comments)    First-in-Class Angiotensin Receptor Neprilysin Inhibitor- Med was "red-flagged" by the patient's pharmacy for him to NOT take!   Hctz [Hydrochlorothiazide] Anaphylaxis and Swelling    Tongue and lip swelling    Aspirin Other (See Comments)    Gastritis, can aspirin not take 325 mg aspirin      Review of Systems  Constitutional:  Negative for chills, fever and malaise/fatigue.  HENT:  Negative for congestion and hearing loss.   Eyes:  Negative for blurred vision and discharge.  Respiratory:  Negative for cough, sputum production and shortness of breath.   Cardiovascular:  Negative for chest pain, palpitations and leg swelling.  Gastrointestinal:  Negative for abdominal pain, blood in stool, constipation, diarrhea, heartburn, nausea and vomiting.  Genitourinary:  Negative for dysuria, frequency, hematuria and urgency.  Musculoskeletal:  Negative for back  pain, falls and myalgias.  Skin:  Negative for rash.  Neurological:  Negative for dizziness, sensory change, loss of consciousness, weakness and headaches.  Endo/Heme/Allergies:  Negative for environmental allergies. Does not bruise/bleed easily.  Psychiatric/Behavioral:  Negative for depression and suicidal ideas. The patient is not nervous/anxious and does not have insomnia.       Objective:     BP 130/78 (BP Location: Left Arm, Patient Position: Sitting, Cuff Size: Large)   Pulse 72   Temp 97.7 F (36.5 C) (Oral)   Resp 18   Ht 6' (1.829 m)   Wt 232 lb 6.4 oz (105.4 kg)   SpO2 96%   BMI 31.52 kg/m  BP Readings from Last 3 Encounters:  04/10/23 130/78  04/07/23 128/65  03/07/23 128/80   Wt Readings from Last 3 Encounters:  04/10/23 232 lb 6.4 oz (105.4 kg)  03/07/23 234 lb 12.8 oz (106.5 kg)  02/18/23 239 lb 9.6 oz (108.7 kg)   SpO2 Readings from Last 3 Encounters:  04/10/23 96%  04/07/23 95%  03/07/23 97%      Physical Exam Vitals and nursing note reviewed.  Constitutional:      General: He is not in acute distress.    Appearance: Normal appearance. He is well-developed.  HENT:     Head: Normocephalic and atraumatic.  Eyes:     General: No scleral icterus.  Right eye: No discharge.        Left eye: No discharge.  Cardiovascular:     Rate and Rhythm: Normal rate and regular rhythm.     Heart sounds: No murmur heard. Pulmonary:     Effort: Pulmonary effort is normal. No respiratory distress.     Breath sounds: Normal breath sounds.  Musculoskeletal:        General: Normal range of motion.     Cervical back: Normal range of motion and neck supple.     Right lower leg: No edema.     Left lower leg: No edema.  Skin:    General: Skin is warm and dry.     Findings: Bruising present.     Comments: Both legs have pressure dressings and compression socks  Neurological:     General: No focal deficit present.     Mental Status: He is alert and oriented to  person, place, and time.  Psychiatric:        Mood and Affect: Mood normal.        Behavior: Behavior normal.        Thought Content: Thought content normal.        Judgment: Judgment normal.      No results found for any visits on 04/10/23.  Last CBC Lab Results  Component Value Date   WBC 9.1 04/07/2023   HGB 13.9 04/07/2023   HCT 42.2 04/07/2023   MCV 88.8 04/07/2023   MCH 29.3 04/07/2023   RDW 16.2 (H) 04/07/2023   PLT 203 04/07/2023   Last metabolic panel Lab Results  Component Value Date   GLUCOSE 249 (H) 04/07/2023   NA 136 04/07/2023   K 4.3 04/07/2023   CL 97 (L) 04/07/2023   CO2 29 04/07/2023   BUN 38 (H) 04/07/2023   CREATININE 1.66 (H) 04/07/2023   GFRNONAA 40 (L) 04/07/2023   CALCIUM 10.2 04/07/2023   PHOS 3.0 08/17/2017   PROT 7.1 04/07/2023   ALBUMIN 4.1 04/07/2023   LABGLOB 2.8 07/27/2018   AGRATIO 1.5 07/27/2018   BILITOT 0.6 04/07/2023   ALKPHOS 62 04/07/2023   AST 21 04/07/2023   ALT 19 04/07/2023   ANIONGAP 10 04/07/2023   Last lipids Lab Results  Component Value Date   CHOL 154 11/26/2022   HDL 31.40 (L) 11/26/2022   LDLCALC 76 01/25/2022   LDLDIRECT 78.0 11/26/2022   TRIG 387.0 (H) 11/26/2022   CHOLHDL 5 11/26/2022   Last hemoglobin A1c Lab Results  Component Value Date   HGBA1C 7.1 (H) 11/26/2022   Last thyroid functions Lab Results  Component Value Date   TSH 2.450 07/27/2018   Last vitamin D Lab Results  Component Value Date   VD25OH 16.1 (L) 07/27/2018   Last vitamin B12 and Folate Lab Results  Component Value Date   VITAMINB12 302 04/07/2023   FOLATE 9.8 08/18/2017      The ASCVD Risk score (Arnett DK, et al., 2019) failed to calculate for the following reasons:   The 2019 ASCVD risk score is only valid for ages 24 to 40   The patient has a prior MI or stroke diagnosis    Assessment & Plan:   Problem List Items Addressed This Visit       Unprioritized   Coronary artery disease   Relevant Orders    Lipid panel   Comprehensive metabolic panel   Type 2 DM with CKD stage 3 and hypertension (HCC) - Primary   Relevant Orders  Hemoglobin A1c   Microalbumin / creatinine urine ratio   Aortic stenosis, severe   Anemia, iron deficiency   Relevant Orders   CBC with Differential/Platelet   Chronic combined systolic and diastolic heart failure (HCC)   Uncontrolled type 2 diabetes mellitus with hyperglycemia (HCC)    Check labs Hgba1c to be checked ,  minimize simple carbs. Increase exercise as tolerated. Continue current meds       Stage 3b chronic kidney disease (CKD) (HCC)    Per nephrology      Permanent atrial fibrillation [facility])    New pacemaker put in  Per cardiology      Hyperlipidemia associated with type 2 diabetes mellitus (HCC)    hgba1c to be checked, minimize simple carbs. Increase exercise as tolerated. Continue current meds       Relevant Orders   Lipid panel   Comprehensive metabolic panel   Essential hypertension    Well controlled, no changes to meds. Encouraged heart healthy diet such as the DASH diet and exercise as tolerated.        Relevant Orders   CBC with Differential/Platelet   Comprehensive metabolic panel   Other Visit Diagnoses     Chronic renal insufficiency, stage 3 (moderate) (HCC)       Relevant Orders   Comprehensive metabolic panel   Sore throat         Assessment and Plan    Chronic Skin Tears Multiple skin tears on legs and arms, currently being managed by the wound clinic. -Continue with wound clinic follow-ups and treatments.  Pacemaker Replacement Recent pacemaker replacement due to inefficiency of the previous device. Post-operative recovery appears to be progressing well. -Continue to follow post-operative instructions, including movement restrictions. -Follow-up with cardiologist as scheduled.  Sore Throat Likely secondary to intubation during recent surgery. -Continue using throat spray as needed. -Contact office if  symptoms worsen.  Constipation Recent difficulty with bowel movements, possibly related to recent hospitalization and antibiotic use. -Consider over-the-counter remedies such as Miralax, pending approval from primary care provider.  General Health Maintenance -Confirm recent vaccinations with sister and local pharmacy. -If necessary, receive outstanding vaccinations at the clinic between 9am and 3pm, Monday through Friday.        No follow-ups on file.    Donato Schultz, DO

## 2023-04-10 NOTE — Telephone Encounter (Signed)
Pt called back to advise that his last RSV shot was 3 weeks ago at [facility] [phone])

## 2023-04-11 ENCOUNTER — Encounter: Payer: Self-pay | Admitting: Family

## 2023-04-11 ENCOUNTER — Ambulatory Visit: Payer: Medicare Other | Admitting: Family

## 2023-04-11 VITALS — BP 130/72 | HR 79 | Temp 98.2°F | Ht 72.0 in | Wt 233.0 lb

## 2023-04-11 DIAGNOSIS — R509 Fever, unspecified: Secondary | ICD-10-CM

## 2023-04-11 LAB — POCT INFLUENZA A/B
Influenza A, POC: NEGATIVE
Influenza B, POC: NEGATIVE

## 2023-04-11 LAB — POC COVID19 BINAXNOW: SARS Coronavirus 2 Ag: NEGATIVE

## 2023-04-11 MED ORDER — DOXYCYCLINE HYCLATE 100 MG PO TABS: 100.0000 mg | ORAL_TABLET | Freq: Two times a day (BID) | ORAL | 0 refills | Status: DC

## 2023-04-11 NOTE — Progress Notes (Signed)
ASTOR GENTLE is a 82 y.o. male with the following history as recorded in Epic[employer]:  Patient Active Problem List   Diagnosis Date Noted   Permanent atrial fibrillation (HCC) 10/23/2022   Simple chronic bronchitis (HCC) 06/03/2022   Cellulitis of lower extremity 04/30/2022   Type 2 DM with CKD stage 3 and hypertension (HCC) 04/30/2022   Cellulitis of right leg 04/24/2022   Stage 3b chronic kidney disease (CKD) (HCC) 04/24/2022   Left upper quadrant pain 04/08/2022   Subacute cough 04/08/2022   Chronic combined systolic and diastolic heart failure (HCC) 02/06/2022   Acute exacerbation of CHF (congestive heart failure) (HCC) 01/16/2022   Hemoptysis 12/26/2021   Subclavian vein stenosis 12/23/2021   Strep throat 11/16/2021   Multiple superficial wounds with infection 01/04/2021   Cardiac pacemaker in situ 11/20/2020   S/P CABG x 4 11/20/2020   Ascending aortic aneurysm (HCC) 11/20/2020   Senile purpura (HCC) 07/27/2020   Porokeratosis 06/20/2020   Pain due to onychomycosis of toenails of both feet 12/14/2019   Educated about COVID-19 virus infection 11/25/2019   DM2 (diabetes mellitus, type 2) (HCC) 08/10/2019   Hyperlipidemia associated with type 2 diabetes mellitus (HCC) 08/10/2019   S/P left TKA 06/29/2019   Peripheral neuropathy 07/27/2018   Uncontrolled type 2 diabetes mellitus with hyperglycemia (HCC) 05/13/2018   Diabetic peripheral neuropathy associated with type 2 diabetes mellitus (HCC) 05/13/2018   Pansinusitis 05/13/2018   Severe aortic stenosis    Complete heart block (HCC)    Paroxysmal atrial fibrillation (HCC)    Acute on chronic diastolic CHF (congestive heart failure), NYHA class 2 (HCC)    Left arm swelling    Status post transcatheter aortic valve replacement (TAVR) using bioprosthesis 09/09/2017   Demand ischemia of myocardium (HCC)    Typical atrial flutter (HCC)    Acute on chronic diastolic heart failure (HCC)    GIB (gastrointestinal bleeding)  08/16/2017   Hyperlipidemia 08/05/2017   NSTEMI (non-ST elevated myocardial infarction) (HCC)    Aortic stenosis, severe 07/25/2017   Pancytopenia (HCC) 07/24/2017   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) 02/27/2017   Bradycardia 01/04/2017   Coronary artery disease 01/04/2017   Stenosis of carotid artery 01/04/2017   Obesity 09/18/2016   Wenckebach block    Disorder associated with type II diabetes mellitus (HCC) 05/21/2013   Spinal stenosis of lumbar region 04/08/2013   Anemia, iron deficiency 11/29/2011   B12 deficiency anemia 07/30/2011   OSA (obstructive sleep apnea) 07/06/2010   CAD, ARTERY BYPASS GRAFT 08/22/2009   Essential hypertension 03/29/2009   Bilateral lower extremity edema 02/21/2009   Hyperlipidemia LDL goal <70 11/26/2008   GOUT 08/17/2006    Current Outpatient Medications  Medication Sig Dispense Refill   acetaminophen (TYLENOL) 325 MG tablet Take 650 mg by mouth at bedtime as needed for moderate pain or headache.     allopurinol (ZYLOPRIM) 300 MG tablet Take 450 mg by mouth daily.     aluminum hydroxide-magnesium carbonate (GAVISCON) 95-358 MG/15ML SUSP Take 15 mLs by mouth as needed for indigestion or heartburn.     apixaban (ELIQUIS) 2.5 MG TABS tablet TAKE 1 TABLET BY MOUTH TWICE A DAY 180 tablet 2   Ascorbic Acid (VITAMIN C PO) Take 1 tablet by mouth daily with breakfast.     atorvastatin (LIPITOR) 80 MG tablet TAKE 1 TABLET BY MOUTH EVERYDAY AT BEDTIME 90 tablet 3   Azelastine HCl 137 MCG/SPRAY SOLN PLACE 2 SPRAYS INTO BOTH NOSTRILS AT BEDTIME AS  NEEDED FOR RHINITIS OR ALLERGIES. 48 mL 1   Coenzyme Q10 (CO Q-10 PO) Take 1 capsule by mouth daily.     doxycycline (VIBRA-TABS) 100 MG tablet Take 1 tablet (100 mg total) by mouth 2 (two) times daily. 14 tablet 0   Dulaglutide (TRULICITY) 3 MG/0.5ML SOPN Inject 3 mg into the skin once a week. 6 mL 1   esomeprazole (NEXIUM) 40 MG capsule Take 1 capsule (40 mg total) by mouth daily.  (Patient taking differently: Take 40 mg by mouth daily before breakfast.) 30 capsule 5   ezetimibe (ZETIA) 10 MG tablet Take 1 tablet (10 mg total) by mouth daily. 100 tablet 3   FARXIGA 10 MG TABS tablet Take 10 mg by mouth in the morning.     fenofibrate 160 MG tablet Take 1 tablet (160 mg total) by mouth daily. 90 tablet 1   folic acid (FOLVITE) 1 MG tablet TAKE 2 TABLETS BY MOUTH EVERY DAY 180 tablet 4   glimepiride (AMARYL) 2 MG tablet Take 1 tablet (2 mg total) by mouth in the morning and at bedtime. 180 tablet 1   glucose blood (FREESTYLE LITE) test strip USE TO TEST BLOOD SUGAR ONCE A DAY. DX CODE: E11.9 100 strip 12   icosapent Ethyl (VASCEPA) 1 g capsule Take 2 capsules (2 g total) by mouth 2 (two) times daily. 360 capsule 1   KLOR-CON M20 20 MEQ tablet TAKE 2 TABLETS BY MOUTH DAILY 180 tablet 2   Lancets (FREESTYLE) lancets USE ONCE A DAY TO CHECK BLOOD SUGAR. 100 each 12   Menthol-Camphor (ICY HOT ADVANCED PAIN RELIEF EX) Apply 1 application  topically daily as needed (to painful sites).     metoprolol succinate (TOPROL-XL) 25 MG 24 hr tablet TAKE 1 TABLET (25 MG TOTAL) BY MOUTH DAILY. 90 tablet 3   nitroGLYCERIN (NITROSTAT) 0.4 MG SL tablet PLACE 1 TABLET UNDER THE TONGUE EVERY 5 MINUTES AS NEEDED FOR CHEST PAIN. 25 tablet 6   pregabalin (LYRICA) 100 MG capsule Take 1 capsule (100 mg total) by mouth daily. At noon 90 capsule 1   pregabalin (LYRICA) 150 MG capsule Take 1 capsule (150 mg total) by mouth at bedtime. (Take 100mg  daily at noon) 90 capsule 1   psyllium (METAMUCIL) 58.6 % powder Take 1 packet by mouth daily as needed (for constipation- mix and drink).     senna (SENOKOT) 8.6 MG TABS tablet Take 2 tablets (17.2 mg total) by mouth at bedtime. This is for constipation.  Stop taking if you start having diarrhea. (Patient taking differently: Take 2 tablets by mouth daily as needed for mild constipation. This is for constipation.  Stop taking if you start having diarrhea.) 20 tablet  0   torsemide (DEMADEX) 20 MG tablet Take 2 tablets (40 mg total) by mouth in the morning. Take extra tablet for weight gain more then 2 to 3 lbs in 24 hours. 205 tablet 0   No current facility-administered medications for this visit.    Allergies: Ace inhibitors, Benazepril, Entresto [sacubitril-valsartan], Hctz [hydrochlorothiazide], and Aspirin  Past Medical History:  Diagnosis Date   Anemia    Arthritis    hips   Axillary adenopathy 02/25/2017   Bradycardia    a. holter monitor has demonstrated HRs in 30s and Weinkibach    CAD (coronary artery disease)    a. s/p CABG 2001  b.  07/28/2017 cath:   Severe three-vessel native CAD with total occlusion of LAD, ramus intermedius, first OM and RCA, patent RIMA  to PDA, LIMA to LAD, sequential SVG to ramus intermedius and first OM.     Chronic lower back pain    Diffuse large B cell lymphoma (HCC)    Diverticulosis    Esophageal stricture    GERD (gastroesophageal reflux disease)    History of gout    HTN (hypertension)    Mixed hyperlipidemia    OSA on CPAP    with 2L O2 at night   Pancytopenia (HCC)    a. related to chemo therapy for B cell lymphoma   Peptic stricture of esophagus    Presence of permanent cardiac pacemaker    sees Dr. Clayborn Bigness pacemaker   SCC (squamous cell carcinoma) 01/31/2021   R zygoma, ED[facility]   SCC (squamous cell carcinoma) 01/31/2021   L post ankle, ED[facility]   SCC (squamous cell carcinoma) 01/31/2021   L popliteal, EDC   Severe aortic stenosis    Spinal stenosis    Squamous cell carcinoma of skin 03/22/2009   Right mandible. SCCis, hypertrophic.    Squamous cell carcinoma of skin 12/25/2021   R lateral cheek, EDC   Squamous cell carcinoma of skin 12/25/2021   R postauricular neck, EDC   Squamous cell carcinoma of skin 12/25/2021   R forearm sup, EDC   Squamous cell carcinoma of skin 12/25/2021   R wrist, EDC   Type II diabetes mellitus (HCC)    Wears dentures    partial upper    Past Surgical  History:  Procedure Laterality Date   APPENDECTOMY  ~ 1952   BACK SURGERY     CATARACT EXTRACTION W/PHACO Left 11/06/2016   Procedure: CATARACT EXTRACTION PHACO AND INTRAOCULAR LENS PLACEMENT (IOC);  Surgeon: Sallee Lange, MD;  Location: [facility] ORS;  Service: Ophthalmology;  Laterality: Left;  Lot # [id number] H [place]: 01:09.4 AP%:25.2 CDE: 30.64   CATARACT EXTRACTION W/PHACO Right 12/04/2016   Procedure: CATARACT EXTRACTION PHACO AND INTRAOCULAR LENS PLACEMENT (IOC);  Surgeon: Sallee Lange, MD;  Location: [facility] ORS;  Service: Ophthalmology;  Laterality: Right;  [place] 1:25.9 AP% 24.1 CDE 39.10 Fluid Pack lot # [id number]   COLONOSCOPY W/ BIOPSIES AND POLYPECTOMY  07/02/2011   CORONARY ANGIOPLASTY  07/02/1991   CORONARY ANGIOPLASTY WITH STENT PLACEMENT  05/31/1997   "1"   CORONARY ARTERY BYPASS GRAFT  03/01/2000   "CABG X5"   ECTROPION REPAIR Right 09/01/2018   Procedure: REPAIR OF ECTROPION BILATERAL upper and lower;  Surgeon: Imagene Riches, MD;  Location: [facility];  Service: Ophthalmology;  Laterality: Right;  Diabetic - oral meds sleep apnea   ESOPHAGEAL DILATION  X 3-4   Dr. Victorino Dike; "last one was in the 1990's"   ESOPHAGOGASTRODUODENOSCOPY     multiple   FLEXIBLE SIGMOIDOSCOPY     multiple   HYDRADENITIS EXCISION Left 02/25/2017   Procedure: EXCISION DEEP LEFT AXILLARY LYMPH NODE;  Surgeon: Claud Kelp, MD;  Location: [facility] OR;  Service: General;  Laterality: Left;   INTRAOPERATIVE TRANSTHORACIC ECHOCARDIOGRAM N/A 09/09/2017   Procedure: INTRAOPERATIVE TRANSTHORACIC ECHOCARDIOGRAM;  Surgeon: Kathleene Hazel, MD;  Location: [facility] OR;  Service: Open Heart Surgery;  Laterality: N/A;   KNEE ARTHROSCOPY Left 07/01/2009   meniscus repair   LEFT HEART CATHETERIZATION WITH CORONARY/GRAFT ANGIOGRAM N/A 03/16/2014   Procedure: LEFT HEART CATHETERIZATION WITH Isabel Caprice;  Surgeon: Kathleene Hazel, MD;  Location: [facility] CATH LAB;  Service:  Cardiovascular;  Laterality: N/A;   LUMBAR LAMINECTOMY/DECOMPRESSION MICRODISCECTOMY Right 06/17/2013   Procedure: LUMBAR LAMINECTOMY MICRODISCECTOMY L4-L5 RIGHT EXCISION OF SYNOVIAL CYST RIGHT   (  1 LEVEL) RIGHT PARTIAL FACETECTOMY;  Surgeon: Jacki Cones, MD;  Location: [facility] ORS;  Service: Orthopedics;  Laterality: Right;   MYELOGRAM  04/06/2013   lumbar, Dr Darrelyn Hillock   ORBITAL LESION EXCISION Right 09/01/2018   Procedure: ORBITOTOMY WITHOUT BONE FLAP WITH REMOVAL OF LESION RIGHT;  Surgeon: Imagene Riches, MD;  Location: [facility];  Service: Ophthalmology;  Laterality: Right;   PACEMAKER IMPLANT N/A 09/10/2017   Procedure: PACEMAKER IMPLANT;  Surgeon: Duke Salvia, MD;  Location: [facility] INVASIVE CV LAB;  Service: Cardiovascular;  Laterality: N/A;   PANENDOSCOPY     PERIPHERAL VASCULAR BALLOON ANGIOPLASTY Left 03/16/2021   Procedure: PERIPHERAL VASCULAR BALLOON ANGIOPLASTY;  Surgeon: Leonie Douglas, MD;  Location: [facility] INVASIVE CV LAB;  Service: Cardiovascular;  Laterality: Left;  subclavian  vein   PORTACATH PLACEMENT N/A 03/06/2017   Procedure: INSERTION PORT-A-CATH AND ASPIRATE SEROMA LEFT AXILLA;  Surgeon: Claud Kelp, MD;  Location: [facility] OR;  Service: General;  Laterality: N/A;   RIGHT/LEFT HEART CATH AND CORONARY/GRAFT ANGIOGRAPHY N/A 07/28/2017   Procedure: RIGHT/LEFT HEART CATH AND CORONARY/GRAFT ANGIOGRAPHY;  Surgeon: Tonny Bollman, MD;  Location: [facility] INVASIVE CV LAB;  Service: Cardiovascular;  Laterality: N/A;   SHOULDER SURGERY Right 08/30/2010   screws placed; "tendons tore off"   SKIN CANCER EXCISION Right    "neck"   TONSILLECTOMY  ~ 1954   TOTAL KNEE ARTHROPLASTY Left 06/29/2019   Procedure: TOTAL KNEE ARTHROPLASTY;  Surgeon: Durene Romans, MD;  Location: [facility] ORS;  Service: Orthopedics;  Laterality: Left;  70 mins   TRANSCATHETER AORTIC VALVE REPLACEMENT, TRANSFEMORAL N/A 09/09/2017   Procedure: TRANSCATHETER AORTIC VALVE REPLACEMENT, TRANSFEMORAL;  Surgeon: Kathleene Hazel, MD;  Location: [facility] OR;  Service: Open Heart Surgery;  Laterality: N/A;   UPPER EXTREMITY VENOGRAPHY Left 02/15/2021   Procedure: CENTRAL VENO;  Surgeon: Leonie Douglas, MD;  Location: [facility] INVASIVE CV LAB;  Service: Cardiovascular;  Laterality: Left;   UPPER EXTREMITY VENOGRAPHY N/A 03/16/2021   Procedure: UPPER EXTREMITY VENOGRAPHY;  Surgeon: Leonie Douglas, MD;  Location: [facility] INVASIVE CV LAB;  Service: Cardiovascular;  Laterality: N/A;   UPPER GI ENDOSCOPY  07/02/2011   Gastritis; Dr Leone Payor   VASECTOMY     wireless pacemaker placed      Family History  Problem Relation Age of Onset   Stroke Father    Hypertension Father    Pancreatic cancer Mother    Diabetes Maternal Grandmother    Stroke Maternal Grandmother    Heart attack Paternal Grandmother    Colon cancer Neg Hx    Esophageal cancer Neg Hx    Rectal cancer Neg Hx    Stomach cancer Neg Hx    Ulcers Neg Hx     Social History   Tobacco Use   Smoking status: Never   Smokeless tobacco: Never  Substance Use Topics   Alcohol use: No    Alcohol/week: 0.0 standard drinks of alcohol    Comment: "last drink was in 2012"( 03/15/2014)    Subjective:   Seen in the office yesterday for 3 month follow up and "felt great." Notes that around 10:00 last night suddenly started "shaking and was very cold." Notes today that just feeling weak; notes that was able to eat egg drop soup today and keep water down; normal bowel movement;  Does note that symptoms have improved today as the day has progressed;  Did have similar symptoms in 2022 that progressed rapidly to sepsis and had to be hospitalized for 5  days at that time; had pacemaker replaced over a week ago; also has numerous small wounds on his lower extremities;     Objective:  Vitals:   04/11/23 1411  BP: 130/72  Pulse: 79  Temp: 98.2 F (36.8 C)  TempSrc: Oral  SpO2: 98%  Weight: 233 lb (105.7 kg)  Height: 6' (1.829 m)    General: Well developed, well  nourished, in no acute distress  Skin : Warm and dry.  Head: Normocephalic and atraumatic  Eyes: Sclera and conjunctiva clear; pupils round and reactive to light; extraocular movements intact  Ears: External normal; canals clear; tympanic membranes normal  Oropharynx: Pink, supple. No suspicious lesions  Neck: Supple without thyromegaly, adenopathy  Lungs: Respirations unlabored; clear to auscultation bilaterally without wheeze, rales, rhonchi  CVS exam: normal rate and regular rhythm.  Neurologic: Alert and oriented; speech intact; face symmetrical; moves all extremities well; CNII-XII intact without focal deficit   Assessment:  1. Fever, unspecified fever cause     Plan:  Physical exam is reassuring in office; rapid flu and COVID both negative; reviewed labs drawn on 10/10 and normal; due to history of rapid progression to sepsis in the past with similar presentation, will go ahead and treat with Doxycycline 100 mg bid x 7 days; follow up worse, no better.   Return for ear lavage with Dr. Laury Axon at patient's convenience once he is feeling better.  Orders Placed This Encounter  Procedures   POC COVID-19    Order Specific Question:   Previously tested for COVID-19    Answer:   No    Order Specific Question:   Resident in a congregate (group) care setting    Answer:   No    Order Specific Question:   Employed in healthcare setting    Answer:   No   POCT Influenza A/B    Requested Prescriptions   Signed Prescriptions Disp Refills   doxycycline (VIBRA-TABS) 100 MG tablet 14 tablet 0    Sig: Take 1 tablet (100 mg total) by mouth 2 (two) times daily.

## 2023-04-14 ENCOUNTER — Inpatient Hospital Stay (HOSPITAL_COMMUNITY)
Admission: EM | Admit: 2023-04-14 | Discharge: 2023-04-16 | DRG: 872 | Disposition: A | Discharge status: Other Acute Inpatient Hospital | Payer: Medicare Other | Attending: Internal Medicine | Admitting: Internal Medicine

## 2023-04-14 ENCOUNTER — Telehealth: Payer: Self-pay | Admitting: Family Medicine

## 2023-04-14 ENCOUNTER — Emergency Department (HOSPITAL_COMMUNITY): Payer: Medicare Other

## 2023-04-14 ENCOUNTER — Other Ambulatory Visit: Payer: Self-pay

## 2023-04-14 DIAGNOSIS — Z79899 Other long term (current) drug therapy: Secondary | ICD-10-CM

## 2023-04-14 DIAGNOSIS — T827XXA Infection and inflammatory reaction due to other cardiac and vascular devices, implants and grafts, initial encounter: Principal | ICD-10-CM | POA: Diagnosis present

## 2023-04-14 DIAGNOSIS — Z951 Presence of aortocoronary bypass graft: Secondary | ICD-10-CM

## 2023-04-14 DIAGNOSIS — G8929 Other chronic pain: Secondary | ICD-10-CM | POA: Diagnosis present

## 2023-04-14 DIAGNOSIS — I428 Other cardiomyopathies: Secondary | ICD-10-CM | POA: Diagnosis present

## 2023-04-14 DIAGNOSIS — N400 Enlarged prostate without lower urinary tract symptoms: Secondary | ICD-10-CM | POA: Diagnosis not present

## 2023-04-14 DIAGNOSIS — C8330 Diffuse large B-cell lymphoma, unspecified site: Secondary | ICD-10-CM | POA: Diagnosis present

## 2023-04-14 DIAGNOSIS — I130 Hypertensive heart and chronic kidney disease with heart failure and stage 1 through stage 4 chronic kidney disease, or unspecified chronic kidney disease: Secondary | ICD-10-CM | POA: Diagnosis present

## 2023-04-14 DIAGNOSIS — R5381 Other malaise: Secondary | ICD-10-CM | POA: Diagnosis not present

## 2023-04-14 DIAGNOSIS — Z7984 Long term (current) use of oral hypoglycemic drugs: Secondary | ICD-10-CM

## 2023-04-14 DIAGNOSIS — N179 Acute kidney failure, unspecified: Secondary | ICD-10-CM | POA: Diagnosis not present

## 2023-04-14 DIAGNOSIS — I4892 Unspecified atrial flutter: Secondary | ICD-10-CM | POA: Diagnosis present

## 2023-04-14 DIAGNOSIS — Z471 Aftercare following joint replacement surgery: Secondary | ICD-10-CM | POA: Diagnosis not present

## 2023-04-14 DIAGNOSIS — Y831 Surgical operation with implant of artificial internal device as the cause of abnormal reaction of the patient, or of later complication, without mention of misadventure at the time of the procedure: Secondary | ICD-10-CM | POA: Diagnosis present

## 2023-04-14 DIAGNOSIS — Z888 Allergy status to other drugs, medicaments and biological substances status: Secondary | ICD-10-CM

## 2023-04-14 DIAGNOSIS — Z8249 Family history of ischemic heart disease and other diseases of the circulatory system: Secondary | ICD-10-CM | POA: Diagnosis not present

## 2023-04-14 DIAGNOSIS — Z955 Presence of coronary angioplasty implant and graft: Secondary | ICD-10-CM

## 2023-04-14 DIAGNOSIS — I110 Hypertensive heart disease with heart failure: Secondary | ICD-10-CM | POA: Diagnosis not present

## 2023-04-14 DIAGNOSIS — A419 Sepsis, unspecified organism: Secondary | ICD-10-CM | POA: Diagnosis not present

## 2023-04-14 DIAGNOSIS — E876 Hypokalemia: Secondary | ICD-10-CM | POA: Diagnosis present

## 2023-04-14 DIAGNOSIS — D649 Anemia, unspecified: Secondary | ICD-10-CM | POA: Diagnosis present

## 2023-04-14 DIAGNOSIS — A4181 Sepsis due to Enterococcus: Secondary | ICD-10-CM | POA: Diagnosis not present

## 2023-04-14 DIAGNOSIS — Z1152 Encounter for screening for COVID-19: Secondary | ICD-10-CM | POA: Diagnosis not present

## 2023-04-14 DIAGNOSIS — R7881 Bacteremia: Secondary | ICD-10-CM | POA: Diagnosis not present

## 2023-04-14 DIAGNOSIS — E1122 Type 2 diabetes mellitus with diabetic chronic kidney disease: Secondary | ICD-10-CM | POA: Diagnosis present

## 2023-04-14 DIAGNOSIS — I5042 Chronic combined systolic (congestive) and diastolic (congestive) heart failure: Secondary | ICD-10-CM | POA: Diagnosis not present

## 2023-04-14 DIAGNOSIS — I4821 Permanent atrial fibrillation: Secondary | ICD-10-CM | POA: Diagnosis not present

## 2023-04-14 DIAGNOSIS — Z800 Family history of malignant neoplasm of digestive organs: Secondary | ICD-10-CM

## 2023-04-14 DIAGNOSIS — I5032 Chronic diastolic (congestive) heart failure: Secondary | ICD-10-CM | POA: Diagnosis present

## 2023-04-14 DIAGNOSIS — Z886 Allergy status to analgesic agent status: Secondary | ICD-10-CM

## 2023-04-14 DIAGNOSIS — C851A Unspecified b-cell lymphoma, in remission: Secondary | ICD-10-CM | POA: Diagnosis not present

## 2023-04-14 DIAGNOSIS — I6529 Occlusion and stenosis of unspecified carotid artery: Secondary | ICD-10-CM | POA: Diagnosis not present

## 2023-04-14 DIAGNOSIS — E669 Obesity, unspecified: Secondary | ICD-10-CM | POA: Diagnosis present

## 2023-04-14 DIAGNOSIS — I2510 Atherosclerotic heart disease of native coronary artery without angina pectoris: Secondary | ICD-10-CM | POA: Diagnosis present

## 2023-04-14 DIAGNOSIS — I483 Typical atrial flutter: Secondary | ICD-10-CM | POA: Diagnosis not present

## 2023-04-14 DIAGNOSIS — Z833 Family history of diabetes mellitus: Secondary | ICD-10-CM

## 2023-04-14 DIAGNOSIS — N1831 Chronic kidney disease, stage 3a: Secondary | ICD-10-CM | POA: Diagnosis present

## 2023-04-14 DIAGNOSIS — Z85828 Personal history of other malignant neoplasm of skin: Secondary | ICD-10-CM

## 2023-04-14 DIAGNOSIS — I083 Combined rheumatic disorders of mitral, aortic and tricuspid valves: Secondary | ICD-10-CM | POA: Diagnosis not present

## 2023-04-14 DIAGNOSIS — M1A9XX0 Chronic gout, unspecified, without tophus (tophi): Secondary | ICD-10-CM | POA: Diagnosis not present

## 2023-04-14 DIAGNOSIS — R591 Generalized enlarged lymph nodes: Secondary | ICD-10-CM | POA: Diagnosis not present

## 2023-04-14 DIAGNOSIS — M109 Gout, unspecified: Secondary | ICD-10-CM | POA: Diagnosis present

## 2023-04-14 DIAGNOSIS — R531 Weakness: Principal | ICD-10-CM

## 2023-04-14 DIAGNOSIS — R6520 Severe sepsis without septic shock: Secondary | ICD-10-CM | POA: Diagnosis not present

## 2023-04-14 DIAGNOSIS — I25810 Atherosclerosis of coronary artery bypass graft(s) without angina pectoris: Secondary | ICD-10-CM | POA: Diagnosis not present

## 2023-04-14 DIAGNOSIS — Z952 Presence of prosthetic heart valve: Secondary | ICD-10-CM

## 2023-04-14 DIAGNOSIS — K219 Gastro-esophageal reflux disease without esophagitis: Secondary | ICD-10-CM | POA: Diagnosis present

## 2023-04-14 DIAGNOSIS — I480 Paroxysmal atrial fibrillation: Secondary | ICD-10-CM | POA: Diagnosis present

## 2023-04-14 DIAGNOSIS — I871 Compression of vein: Secondary | ICD-10-CM | POA: Diagnosis not present

## 2023-04-14 DIAGNOSIS — E782 Mixed hyperlipidemia: Secondary | ICD-10-CM | POA: Diagnosis present

## 2023-04-14 DIAGNOSIS — Z7901 Long term (current) use of anticoagulants: Secondary | ICD-10-CM | POA: Diagnosis not present

## 2023-04-14 DIAGNOSIS — E1140 Type 2 diabetes mellitus with diabetic neuropathy, unspecified: Secondary | ICD-10-CM | POA: Diagnosis not present

## 2023-04-14 DIAGNOSIS — R0682 Tachypnea, not elsewhere classified: Secondary | ICD-10-CM | POA: Diagnosis not present

## 2023-04-14 DIAGNOSIS — G4733 Obstructive sleep apnea (adult) (pediatric): Secondary | ICD-10-CM | POA: Diagnosis present

## 2023-04-14 DIAGNOSIS — M48061 Spinal stenosis, lumbar region without neurogenic claudication: Secondary | ICD-10-CM | POA: Diagnosis present

## 2023-04-14 DIAGNOSIS — Z6831 Body mass index (BMI) 31.0-31.9, adult: Secondary | ICD-10-CM

## 2023-04-14 DIAGNOSIS — T829XXA Unspecified complication of cardiac and vascular prosthetic device, implant and graft, initial encounter: Secondary | ICD-10-CM | POA: Diagnosis not present

## 2023-04-14 DIAGNOSIS — K862 Cyst of pancreas: Secondary | ICD-10-CM | POA: Diagnosis not present

## 2023-04-14 DIAGNOSIS — R000 Tachycardia, unspecified: Secondary | ICD-10-CM | POA: Diagnosis not present

## 2023-04-14 DIAGNOSIS — Z950 Presence of cardiac pacemaker: Secondary | ICD-10-CM

## 2023-04-14 DIAGNOSIS — R0902 Hypoxemia: Secondary | ICD-10-CM | POA: Diagnosis not present

## 2023-04-14 DIAGNOSIS — I700 Atherosclerosis of aorta: Secondary | ICD-10-CM | POA: Diagnosis not present

## 2023-04-14 DIAGNOSIS — R590 Localized enlarged lymph nodes: Secondary | ICD-10-CM | POA: Diagnosis present

## 2023-04-14 DIAGNOSIS — I2582 Chronic total occlusion of coronary artery: Secondary | ICD-10-CM | POA: Diagnosis not present

## 2023-04-14 DIAGNOSIS — B952 Enterococcus as the cause of diseases classified elsewhere: Secondary | ICD-10-CM | POA: Diagnosis not present

## 2023-04-14 DIAGNOSIS — E785 Hyperlipidemia, unspecified: Secondary | ICD-10-CM | POA: Diagnosis not present

## 2023-04-14 DIAGNOSIS — E119 Type 2 diabetes mellitus without complications: Secondary | ICD-10-CM | POA: Diagnosis not present

## 2023-04-14 DIAGNOSIS — K5730 Diverticulosis of large intestine without perforation or abscess without bleeding: Secondary | ICD-10-CM | POA: Diagnosis not present

## 2023-04-14 DIAGNOSIS — Z96652 Presence of left artificial knee joint: Secondary | ICD-10-CM | POA: Diagnosis present

## 2023-04-14 DIAGNOSIS — I38 Endocarditis, valve unspecified: Secondary | ICD-10-CM | POA: Diagnosis not present

## 2023-04-14 DIAGNOSIS — I959 Hypotension, unspecified: Secondary | ICD-10-CM | POA: Diagnosis present

## 2023-04-14 DIAGNOSIS — Z7985 Long-term (current) use of injectable non-insulin antidiabetic drugs: Secondary | ICD-10-CM

## 2023-04-14 DIAGNOSIS — Z823 Family history of stroke: Secondary | ICD-10-CM

## 2023-04-14 LAB — CBC WITH DIFFERENTIAL/PLATELET
Abs Immature Granulocytes: 0.08 10*3/uL — ABNORMAL HIGH (ref 0.00–0.07)
Basophils Absolute: 0 10*3/uL (ref 0.0–0.1)
Basophils Relative: 0 %
Eosinophils Absolute: 0 10*3/uL (ref 0.0–0.5)
Eosinophils Relative: 0 %
HCT: 40.3 % (ref 39.0–52.0)
Hemoglobin: 13.1 g/dL (ref 13.0–17.0)
Immature Granulocytes: 1 %
Lymphocytes Relative: 14 %
Lymphs Abs: 0.9 10*3/uL (ref 0.7–4.0)
MCH: 29.4 pg (ref 26.0–34.0)
MCHC: 32.5 g/dL (ref 30.0–36.0)
MCV: 90.4 fL (ref 80.0–100.0)
Monocytes Absolute: 0.4 10*3/uL (ref 0.1–1.0)
Monocytes Relative: 7 %
Neutro Abs: 5.1 10*3/uL (ref 1.7–7.7)
Neutrophils Relative %: 78 %
Platelets: 184 10*3/uL (ref 150–400)
RBC: 4.46 MIL/uL (ref 4.22–5.81)
RDW: 16.3 % — ABNORMAL HIGH (ref 11.5–15.5)
WBC: 6.6 10*3/uL (ref 4.0–10.5)
nRBC: 0 % (ref 0.0–0.2)

## 2023-04-14 LAB — RESP PANEL BY RT-PCR (RSV, FLU A&B, COVID)  RVPGX2
Influenza A by PCR: NEGATIVE
Influenza B by PCR: NEGATIVE
Resp Syncytial Virus by PCR: NEGATIVE
SARS Coronavirus 2 by RT PCR: NEGATIVE

## 2023-04-14 LAB — I-STAT CG4 LACTIC ACID, ED: Lactic Acid, Venous: 1.7 mmol/L (ref 0.5–1.9)

## 2023-04-14 LAB — URINALYSIS, W/ REFLEX TO CULTURE (INFECTION SUSPECTED)
Bacteria, UA: NONE SEEN
Bilirubin Urine: NEGATIVE
Glucose, UA: >=500 mg/dL — AB
Hgb urine dipstick: NEGATIVE
Ketones, ur: NEGATIVE mg/dL
Leukocytes,Ua: NEGATIVE
Nitrite: NEGATIVE
Protein, ur: NEGATIVE mg/dL
Specific Gravity, Urine: 1.009 (ref 1.005–1.030)
pH: 6 (ref 5.0–8.0)

## 2023-04-14 LAB — COMPREHENSIVE METABOLIC PANEL
ALT: 36 U/L (ref 0–44)
AST: 40 U/L (ref 15–41)
Albumin: 3.7 g/dL (ref 3.5–5.0)
Alkaline Phosphatase: 60 U/L (ref 38–126)
Anion gap: 11 (ref 5–15)
BUN: 22 mg/dL (ref 8–23)
CO2: 23 mmol/L (ref 22–32)
Calcium: 8.3 mg/dL — ABNORMAL LOW (ref 8.9–10.3)
Chloride: 100 mmol/L (ref 98–111)
Creatinine, Ser: 1.55 mg/dL — ABNORMAL HIGH (ref 0.61–1.24)
GFR, Estimated: 44 mL/min — ABNORMAL LOW (ref >60)
Glucose, Bld: 152 mg/dL — ABNORMAL HIGH (ref 70–99)
Potassium: 3.5 mmol/L (ref 3.5–5.1)
Sodium: 134 mmol/L — ABNORMAL LOW (ref 135–145)
Total Bilirubin: 0.8 mg/dL (ref 0.3–1.2)
Total Protein: 7 g/dL (ref 6.5–8.1)

## 2023-04-14 LAB — PROTIME-INR
INR: 1.2 (ref 0.8–1.2)
Prothrombin Time: 15.7 s — ABNORMAL HIGH (ref 11.4–15.2)

## 2023-04-14 LAB — GROUP A STREP BY PCR: Group A Strep by PCR: NOT DETECTED

## 2023-04-14 LAB — CBG MONITORING, ED: Glucose-Capillary: 101 mg/dL — ABNORMAL HIGH (ref 70–99)

## 2023-04-14 MED ORDER — VANCOMYCIN HCL IN DEXTROSE 1-5 GM/200ML-% IV SOLN: 1000.0000 mg | INTRAVENOUS | Status: DC

## 2023-04-14 MED ORDER — VANCOMYCIN HCL 1500 MG/300ML IV SOLN
1500.0000 mg | Freq: Once | INTRAVENOUS | Status: AC
  Administered 2023-04-14: 1500 mg via INTRAVENOUS
  Filled 2023-04-14: qty 300

## 2023-04-14 MED ORDER — SODIUM CHLORIDE 0.9 % IV BOLUS
1000.0000 mL | Freq: Once | INTRAVENOUS | Status: AC
  Administered 2023-04-14: 1000 mL via INTRAVENOUS

## 2023-04-14 MED ORDER — SODIUM CHLORIDE 0.9 % IV SOLN
2.0000 g | Freq: Once | INTRAVENOUS | Status: AC
  Administered 2023-04-14: 2 g via INTRAVENOUS
  Filled 2023-04-14: qty 12.5

## 2023-04-14 MED ORDER — ACETAMINOPHEN 500 MG PO TABS
1000.0000 mg | ORAL_TABLET | Freq: Once | ORAL | Status: AC
  Administered 2023-04-14: 1000 mg via ORAL
  Filled 2023-04-14: qty 2

## 2023-04-14 MED ORDER — IOHEXOL 300 MG/ML  SOLN
80.0000 mL | Freq: Once | INTRAMUSCULAR | Status: AC | PRN
  Administered 2023-04-14: 80 mL via INTRAVENOUS

## 2023-04-14 NOTE — Progress Notes (Signed)
Pharmacy Antibiotic Note  WILBERT HAYASHI is a 82 y.o. male admitted on 04/14/2023 with cellulitis.  Pharmacy has been consulted for vancomycin dosing.  Today, 04/14/23 WBC WNL SCr 1.55, CrCl 44 mL/min  Plan: Vancomycin 1500 mg loading dose followed by 1000 mg IV q24h for estimated AUC of 518 Goal AUC 400-550. Check levels at steady state as needed Follow renal function, culture data  Height: 6' (182.9 cm) Weight: 105 kg (231 lb 7.7 oz) IBW/kg (Calculated) : 77.6  Temp (24hrs), Avg:101.7 F (38.7 C), Min:101.4 F (38.6 C), Max:102 F (38.9 C)  Recent Labs  Lab 04/10/23 1020 04/14/23 1915 04/14/23 1929  WBC 9.3 6.6  --   CREATININE 1.53* 1.55*  --   LATICACIDVEN  --   --  1.7    Estimated Creatinine Clearance: 44.5 mL/min (A) (by C-G formula based on SCr of 1.55 mg/dL (H)).    Allergies  Allergen Reactions   Ace Inhibitors Swelling and Other (See Comments)    Angioedema    Benazepril Swelling and Other (See Comments)    Angioedema; he is not a candidate for any angiotensin receptor blockers because of this significant allergic reaction. Because of a history of documented adverse serious drug reaction;Medi Alert bracelet  is recommended   Entresto [Sacubitril-Valsartan] Swelling and Other (See Comments)    First-in-Class Angiotensin Receptor Neprilysin Inhibitor- Med was "red-flagged" by the patient's pharmacy for him to NOT take!   Hctz [Hydrochlorothiazide] Anaphylaxis and Swelling    Tongue and lip swelling    Aspirin Other (See Comments)    Gastritis, can aspirin not take 325 mg aspirin    Antimicrobials this admission: vancomycin 10/14 >>   Dose adjustments this admission:  Microbiology results: 10/14 BCx:   Cindi Carbon, PharmD 04/14/2023 9:02 PM

## 2023-04-14 NOTE — ED Provider Notes (Signed)
EMERGENCY DEPARTMENT AT [facility] Provider Note   CSN: [id number] Arrival date & time: 04/14/23  1845     History  Chief Complaint  Patient presents with   Weakness    Richard [name] is a 82 y.o. male.  Richard [name] is an 82 yo M with PMH of AD with CABG, Afib on Eliquis, HFpEF, T2DM, complete heart block with pacemaker replacement 2 weeks ago who is presenting to [facility] ED with 4 days of fever, chills, fatigue, sore throat, and headache with development of non-bloody diarrhea and nausea with meals today. He was in his normal lifestyle when these symptoms started, primarily staying at home and resting as he recovers from his pacemaker placement. He sought evaluation for this with his PCP on Friday and was started on doxycycline but his symptoms have continued to worsen. He states that he measured his home blood pressure in the 90s systolic and had sugar of 58 this morning that improved with Gatorade. He is only tolerating Gatorade and egg soup by mouth, otherwise getting nauseous but he has not had emesis. He denies sick contacts, he lives alone and is only visited by his sister over the past few weeks. He denies lightheadedness, vision changes, chest pain, palpitations, dyspnea, cough, abdominal pain, weight gain, leg swelling, or dysuria.   Weakness Associated symptoms: diarrhea, fever, headaches and nausea   Associated symptoms: no abdominal pain, no arthralgias, no chest pain, no cough, no dizziness, no dysuria, no myalgias, no shortness of breath, no urgency and no vomiting        Home Medications Prior to Admission medications   Medication Sig Start Date End Date Taking? Authorizing Provider  acetaminophen (TYLENOL) 325 MG tablet Take 650 mg by mouth at bedtime as needed for moderate pain or headache.   Yes [provider]  allopurinol (ZYLOPRIM) 300 MG tablet Take 450 mg by mouth daily.   Yes [provider]  aluminum  hydroxide-magnesium carbonate (GAVISCON) 95-358 MG/15ML SUSP Take 15 mLs by mouth as needed for indigestion or heartburn.   Yes [provider]  apixaban (ELIQUIS) 2.5 MG TABS tablet TAKE 1 TABLET BY MOUTH TWICE A DAY 11/26/22  Yes Hochrein, Fayrene Fearing, MD  Ascorbic Acid (VITAMIN C PO) Take 1 tablet by mouth daily with breakfast.   Yes [provider]  atorvastatin (LIPITOR) 80 MG tablet TAKE 1 TABLET BY MOUTH EVERYDAY AT BEDTIME 12/12/22  Yes Hochrein, Fayrene Fearing, MD  Azelastine HCl 137 MCG/SPRAY SOLN PLACE 2 SPRAYS INTO BOTH NOSTRILS AT BEDTIME AS NEEDED FOR RHINITIS OR ALLERGIES. 02/17/23  Yes Seabron Spates R, DO  Coenzyme Q10 (CO Q-10 PO) Take 1 capsule by mouth daily.   Yes [provider]  doxycycline (VIBRA-TABS) 100 MG tablet Take 1 tablet (100 mg total) by mouth 2 (two) times daily. 04/11/23  Yes Olive Bass, FNP  Dulaglutide (TRULICITY) 3 MG/0.5ML SOPN Inject 3 mg into the skin once a week. 02/17/23  Yes Seabron Spates R, DO  esomeprazole (NEXIUM) 40 MG capsule Take 1 capsule (40 mg total) by mouth daily. Patient taking differently: Take 40 mg by mouth daily before breakfast. 10/15/12  Yes Pecola Lawless, MD  ezetimibe (ZETIA) 10 MG tablet Take 1 tablet (10 mg total) by mouth daily. 10/14/22  Yes Lowne Chase, Yvonne R, DO  FARXIGA 10 MG TABS tablet Take 10 mg by mouth in the morning.   Yes [provider]  fenofibrate 160 MG tablet Take 1 tablet (  160 mg total) by mouth daily. 03/25/23  Yes Seabron Spates R, DO  folic acid (FOLVITE) 1 MG tablet TAKE 2 TABLETS BY MOUTH EVERY DAY 06/27/22  Yes Ennever, Rose Phi, MD  glimepiride (AMARYL) 2 MG tablet Take 1 tablet (2 mg total) by mouth in the morning and at bedtime. 02/17/23  Yes Donato Schultz, DO  icosapent Ethyl (VASCEPA) 1 g capsule Take 2 capsules (2 g total) by mouth 2 (two) times daily. 02/17/23  Yes Lowne Chase, Yvonne R, DO  KLOR-CON M20 20 MEQ tablet TAKE 2 TABLETS BY MOUTH DAILY  12/10/22  Yes Hochrein, Fayrene Fearing, MD  Menthol-Camphor (ICY HOT ADVANCED PAIN RELIEF EX) Apply 1 application  topically daily as needed (to painful sites).   Yes [provider]  metoprolol succinate (TOPROL-XL) 25 MG 24 hr tablet TAKE 1 TABLET (25 MG TOTAL) BY MOUTH DAILY. 01/30/23  Yes Hochrein, Fayrene Fearing, MD  nitroGLYCERIN (NITROSTAT) 0.4 MG SL tablet PLACE 1 TABLET UNDER THE TONGUE EVERY 5 MINUTES AS NEEDED FOR CHEST PAIN. 08/22/22  Yes Rollene Rotunda, MD  pregabalin (LYRICA) 100 MG capsule Take 1 capsule (100 mg total) by mouth daily. At noon 12/27/22  Yes Lowne Florina Ou R, DO  pregabalin (LYRICA) 150 MG capsule Take 1 capsule (150 mg total) by mouth at bedtime. (Take 100mg  daily at noon) 12/27/22  Yes Zola Button, Grayling Congress, DO  psyllium (METAMUCIL) 58.6 % powder Take 1 packet by mouth daily as needed (for constipation- mix and drink).   Yes [provider]  torsemide (DEMADEX) 20 MG tablet Take 2 tablets (40 mg total) by mouth in the morning. Take extra tablet for weight gain more then 2 to 3 lbs in 24 hours. 02/17/23  Yes Seabron Spates R, DO  glucose blood (FREESTYLE LITE) test strip USE TO TEST BLOOD SUGAR ONCE A DAY. DX CODE: E11.9 07/15/22   Zola Button, Myrene Buddy R, DO  Lancets (FREESTYLE) lancets USE ONCE A DAY TO CHECK BLOOD SUGAR. 11/22/22   Zola Button, Grayling Congress, DO  senna (SENOKOT) 8.6 MG TABS tablet Take 2 tablets (17.2 mg total) by mouth at bedtime. This is for constipation.  Stop taking if you start having diarrhea. Patient not taking: Reported on 04/14/2023 01/23/22   Elease Etienne, MD      Allergies    Ace inhibitors, Benazepril, Entresto [sacubitril-valsartan], Hctz [hydrochlorothiazide], and Aspirin    Review of Systems   Review of Systems  Constitutional:  Positive for chills, fatigue and fever. Negative for unexpected weight change.  HENT:  Negative for congestion, rhinorrhea, sneezing and sore throat.   Eyes:  Positive for pain. Negative for visual  disturbance.  Respiratory:  Negative for cough, shortness of breath and wheezing.   Cardiovascular:  Negative for chest pain and palpitations.  Gastrointestinal:  Positive for diarrhea and nausea. Negative for abdominal distention, abdominal pain, constipation and vomiting.  Genitourinary:  Negative for dysuria, flank pain and urgency.  Musculoskeletal:  Negative for arthralgias, back pain, myalgias, neck pain and neck stiffness.  Neurological:  Positive for weakness and headaches. Negative for dizziness, syncope and light-headedness.  Psychiatric/Behavioral:  Negative for confusion and hallucinations.     Physical Exam Updated Vital Signs BP 113/78 (BP Location: Right Arm)   Pulse 92   Temp 98.2 F (36.8 C) (Oral)   Resp 15   Ht 6' (1.829 m)   Wt 105 kg   SpO2 97%   BMI 31.39 kg/m  Physical Exam Constitutional:  General: He is not in acute distress.    Appearance: Normal appearance. He is not ill-appearing.  HENT:     Head: Normocephalic and atraumatic.  Cardiovascular:     Rate and Rhythm: Regular rhythm. Tachycardia present.     Pulses: Normal pulses.     Heart sounds: Normal heart sounds.  Pulmonary:     Effort: Pulmonary effort is normal.     Breath sounds: Normal breath sounds. No wheezing or rales.  Abdominal:     General: Abdomen is flat. Bowel sounds are normal. There is no distension.     Palpations: Abdomen is soft.     Tenderness: There is abdominal tenderness. There is no right CVA tenderness, left CVA tenderness, guarding or rebound.  Musculoskeletal:     Cervical back: No rigidity or tenderness.     Right lower leg: No edema.     Left lower leg: No edema.  Skin:    General: Skin is warm and dry.     Capillary Refill: Capillary refill takes less than 2 seconds.  Neurological:     General: No focal deficit present.     Mental Status: He is alert and oriented to person, place, and time.  Psychiatric:        Mood and Affect: Mood normal.         Behavior: Behavior normal.     ED Results / Procedures / Treatments   Labs (all labs ordered are listed, but only abnormal results are displayed) Labs Reviewed  COMPREHENSIVE METABOLIC PANEL - Abnormal; Notable for the following components:      Result Value   Sodium 134 (*)    Glucose, Bld 152 (*)    Creatinine, Ser 1.55 (*)    Calcium 8.3 (*)    GFR, Estimated 44 (*)    All other components within normal limits  CBC WITH DIFFERENTIAL/PLATELET - Abnormal; Notable for the following components:   RDW 16.3 (*)    Abs Immature Granulocytes 0.08 (*)    All other components within normal limits  PROTIME-INR - Abnormal; Notable for the following components:   Prothrombin Time 15.7 (*)    All other components within normal limits  URINALYSIS, W/ REFLEX TO CULTURE (INFECTION SUSPECTED) - Abnormal; Notable for the following components:   Color, Urine STRAW (*)    Glucose, UA >=500 (*)    All other components within normal limits  RESP PANEL BY RT-PCR (RSV, FLU A&B, COVID)  RVPGX2  GROUP A STREP BY PCR  CULTURE, BLOOD (ROUTINE X 2)  CULTURE, BLOOD (ROUTINE X 2)  RESPIRATORY PANEL BY PCR  I-STAT CG4 LACTIC ACID, ED    EKG None  Radiology DG Chest Port 1 View  Result Date: 04/14/2023 CLINICAL DATA:  Sepsis EXAM: PORTABLE CHEST 1 VIEW COMPARISON:  06/03/2022 FINDINGS: Right-sided pacing device with leads projecting over right atrium and right ventricle. Post sternotomy changes and valve prosthesis. Previously noted lead this pacemaker no longer visualized. No focal airspace disease or effusion. Stable cardiomediastinal silhouette with aortic atherosclerosis. IMPRESSION: No active disease. Electronically Signed   By: Jasmine Pang M.D.   On: 04/14/2023 22:06    Procedures Procedures    Medications Ordered in ED Medications  vancomycin (VANCOREADY) IVPB 1500 mg/300 mL (1,500 mg Intravenous New Bag/Given 04/14/23 2217)  vancomycin (VANCOCIN) IVPB 1000 mg/200 mL premix (has no  administration in time range)  acetaminophen (TYLENOL) tablet 1,000 mg (1,000 mg Oral Given 04/14/23 2018)  ceFEPIme (MAXIPIME) 2 g in sodium chloride 0.9 %  100 mL IVPB (0 g Intravenous Stopped 04/14/23 2142)  sodium chloride 0.9 % bolus 1,000 mL (1,000 mLs Intravenous New Bag/Given 04/14/23 2136)  iohexol (OMNIPAQUE) 300 MG/ML solution 80 mL (80 mLs Intravenous Contrast Given 04/14/23 2250)    ED Course/ Medical Decision Making/ A&P Clinical Course as of 04/14/23 2258  Mon Apr 14, 2023  1927 Watcher  Sick since thursday Full body sxs.  On doxy  GCS 15  [CC]    Clinical Course User Index [CC] Glyn Ade, MD                                 Medical Decision Making This is an 82 yo M with PMH of CAD with CABG, Afib on Eliquis, HFpEF, T2DM, complete heart block with pacemaker replacement 2 weeks complaining of malaise, fever, chills, headache, diarrhea, and nausea with meals x 4 days. Was evaluated by PCP on Friday shortly after symptoms started and doxycycline was initiated but he felt worse as the weekend went on. Pt arrived with fever of 103 as well as tachycardia and tachypnea and a report of hypotension at home. Code sepsis was initiated. He was generally normotensive although there were intermittent periods of hypotension with Maps in mid 60s. He has been well perfused and with clear mentation and there is no lactic acidosis. Fluid resuscitation was done. He was started on Vancomycin and Cefepime. Workup to this point has been unable to identify a source of infection. There is no cardiopulmonary disease, no UTI, Covid/Flu/RSV are negative. Extended viral panel is pending. He does have b/l leg wounds for which he sees wound clinic and has had past infections, but today his legs look well without evidence of cellulitis, purulence, new wounds. Pt is complaining of headache but not concerned for meningitis without neck pain/rigidity or further neurologic symptoms. CT chest/ab/pelvis to  be performed for possible abscess.   At this point, are considering line infection per his recent pacemaker change unless CT or viral panel results with a clear alternative source of infection. If merely viral, patient will likely be safe to discharge home. If CT reveals likely source of bacterial infection beyond pacemaker, will likely need admission to this facility. That said, we curbsided hospitalist who identified that, should pacemaker infection become the most likely source, pt will require transfer to [facility] where the procedure was done. If that is the case, patient will likely require boarding until transfer to that system. Pt will be signed out to oncoming team. Ultimate disposition to be determined.  Amount and/or Complexity of Data Reviewed Labs: ordered. Radiology: ordered.  Risk OTC drugs. Prescription drug management.          Final Clinical Impression(s) / ED Diagnoses Final diagnoses:  None    Rx / DC Orders ED Discharge Orders     None         Katheran James, DO 04/14/23 2258    Glyn Ade, MD 04/14/23 2330

## 2023-04-14 NOTE — Telephone Encounter (Addendum)
Patient is having chills / "feels like I am freezing to death". He states he feel terrible and aching. A little congestion.  He was seen in our office Friday 04/11/2023. He was told that if he was not feeling better to call back today.   After his blood glucose was 58, he drank Gatorade. Blood glucose 1 hour after improved to 200.  He has not eaten much today except soup.  Recommended he check blood glucose tonight and if 110 or less to hold glimepiride.  Also recommended he take acetaminophen 500 to 1000mg  up to every 8 hours for fever and aches and continue to drink plenty of fluids.  Will forward to PCP for other recommendations.

## 2023-04-14 NOTE — ED Triage Notes (Signed)
Patient BIB EMS from home c/o weakness x3 days. Patient report worsening weakness, body aches and fever today. Patient report he was seen and prescribed with Antibiotc 3 days ago by primary care. Patient denies  N/V. Patient a/ox4. Patient hx pacemaker implant 2 weeks ago.  BP 124/86 HR 110 RR 25 O2sat 98% on 3L.

## 2023-04-14 NOTE — Telephone Encounter (Signed)
Patient said he woke up this morning and his blood sugar is 58. He said he feels bad like he has the flu. He is aching all over. Pt is drinking gatorade to try to bring it up. Patient said it was normal yesterday.

## 2023-04-14 NOTE — ED Provider Notes (Signed)
Accepted handoff at shift change from resident Dr Ninfa Meeker and attending Dr Doran Durand. Please see prior provider note for full HPI.  Briefly: Patient is a 82 y.o. male who presents to the ER for fever, chills, fatigue, sore throat, and diarrhea. Pacemaker placed at [facility] on 10/1 with Dr Gary Fleet   DDX/Plan: No source found on initial workup. Plan to follow up on respiratory panel and CT imaging. Hospitalist at this facility felt best managed by cardiology, likely at [facility] if no source localized.   Results   Respiratory (~20 pathogens) panel by PCR   Specimen: Nasopharyngeal Swab; Respiratory  Result Value Ref Range   Adenovirus NOT DETECTED NOT DETECTED   Coronavirus 229E NOT DETECTED NOT DETECTED   Coronavirus HKU1 NOT DETECTED NOT DETECTED   Coronavirus NL63 NOT DETECTED NOT DETECTED   Coronavirus OC43 NOT DETECTED NOT DETECTED   Metapneumovirus NOT DETECTED NOT DETECTED   Rhinovirus / Enterovirus NOT DETECTED NOT DETECTED   Influenza A NOT DETECTED NOT DETECTED   Influenza B NOT DETECTED NOT DETECTED   Parainfluenza Virus 1 NOT DETECTED NOT DETECTED   Parainfluenza Virus 2 NOT DETECTED NOT DETECTED   Parainfluenza Virus 3 NOT DETECTED NOT DETECTED   Parainfluenza Virus 4 NOT DETECTED NOT DETECTED   Respiratory Syncytial Virus NOT DETECTED NOT DETECTED   Bordetella pertussis NOT DETECTED NOT DETECTED   Bordetella Parapertussis NOT DETECTED NOT DETECTED   Chlamydophila pneumoniae NOT DETECTED NOT DETECTED   Mycoplasma pneumoniae NOT DETECTED NOT DETECTED   CT CHEST ABDOMEN PELVIS W CONTRAST  Result Date: 04/15/2023 CLINICAL DATA:  Sepsis.  History of lymphoma. EXAM: CT CHEST, ABDOMEN, AND PELVIS WITH CONTRAST TECHNIQUE: Multidetector CT imaging of the chest, abdomen and pelvis was performed following the standard protocol during bolus administration of intravenous contrast. RADIATION DOSE REDUCTION: This exam was performed according to the departmental dose-optimization program which  includes automated exposure control, adjustment of the mA and/or kV according to patient size and/or use of iterative reconstruction technique. CONTRAST:  80mL OMNIPAQUE IOHEXOL 300 MG/ML  SOLN COMPARISON:  Chest CT 10/24/2020.  PET-CT 02/24/2018. FINDINGS: CT CHEST FINDINGS Cardiovascular: Heart is enlarged. Pacemaker and aortic valve are again noted. Aorta is normal in size. There are atherosclerotic calcifications of the aorta and coronary arteries. There is no pericardial effusion. Mediastinum/Nodes: There is an enlarged high left paratracheal lymph node measuring 14 mm short axis image 2/12 which has increased in size. There are no other enlarged mediastinal, hilar or axillary lymph nodes present. The visualized esophagus and thyroid gland are within normal limits. Lungs/Pleura: There is minimal atelectasis in the lung bases. There some minimal peripheral reticular opacities in both lung bases as well. There is no focal lung infiltrate, pleural effusion or pneumothorax. There is scarring in both lung apices. Musculoskeletal: Sternotomy wires are present. No acute fracture or focal osseous lesion identified. Degenerative changes affect the spine. CT ABDOMEN PELVIS FINDINGS Hepatobiliary: No focal liver abnormality is seen. No gallstones, gallbladder wall thickening, or biliary dilatation. Pancreas: Rounded low-attenuation/cystic areas measuring up to 11 mm in the pancreatic tail appear more prominent when compared to the prior study. No pancreatic ductal dilatation or surrounding inflammatory changes. Spleen: Normal in size without focal abnormality. Adrenals/Urinary Tract: Trabeculated bladder wall is seen near the dome of the bladder. No bladder calculi are seen. The bladder is distended. The kidneys and adrenal glands are within normal limits. Stomach/Bowel: Stomach is within normal limits. No evidence of bowel wall thickening, distention, or inflammatory changes. The appendix is not  seen. There is sigmoid  colon diverticulosis. Vascular/Lymphatic: Aortic atherosclerosis. Aorta and IVC are normal in size. There is an enlarged right inguinal lymph node measuring 1.0 x 2.3 cm which is new from prior. Reproductive: Prostate gland is enlarged. Other: No abdominal wall hernia or abnormality. No abdominopelvic ascites. Musculoskeletal: No acute or significant osseous findings. IMPRESSION: 1. New enlarged left paratracheal lymph node and right inguinal lymph node. Findings may be related to patient's history of lymphoma. 2. Cardiomegaly. 3. Minimal peripheral reticular opacities in the lung bases may represent mild interstitial lung disease. 4. Trabeculated bladder wall near the dome of the bladder. Correlate clinically for cystitis. 5. Prostate gland is enlarged. 6. Colonic diverticulosis without evidence for diverticulitis. 7. Cystic areas in the pancreatic tail appear more prominent when compared to the prior. This can be further evaluated with MRI. Aortic Atherosclerosis (ICD10-I70.0). Electronically Signed   By: Darliss Cheney M.D.   On: 04/15/2023 01:21   ED Course / MDM   Clinical Course as of 04/15/23 0438  Mon Apr 14, 2023  1927 Watcher  Sick since thursday Full body sxs.  On doxy  GCS 15  [CC]  Tue Apr 15, 2023  0404 Consulted with [facility] Physician's Access Line. Reported no beds available for transfer at this time but patient placed on wait list.  [LR]    Clinical Course User Index [CC] Glyn Ade, MD [LR] Iveliz Garay, Lora Paula, PA-C   Medical Decision Making Amount and/or Complexity of Data Reviewed Labs: ordered. Radiology: ordered.  Risk OTC drugs. Prescription drug management.  Respiratory panel negative. CT chest/abd/pelvis without localized source of infection either. At this time plan for transfer to [facility] for management of possible pacemaker infection. Blood cultures pending.   Consulted with cardiologist at [facility] Dr Darrin Nipper who recommended admission with internal medicine  service.  0400 -- [facility] Physician's Access Line called to take report, advised no open beds at this time. Will place patient on wait list. Pt will board in ER until transfer. Home medications ordered.    Jeanella Flattery 04/15/23 0960    Zadie Rhine, MD 04/15/23 [phone]

## 2023-04-15 ENCOUNTER — Ambulatory Visit (HOSPITAL_BASED_OUTPATIENT_CLINIC_OR_DEPARTMENT_OTHER): Payer: Medicare Other | Admitting: General Surgery

## 2023-04-15 DIAGNOSIS — N1831 Chronic kidney disease, stage 3a: Secondary | ICD-10-CM | POA: Diagnosis present

## 2023-04-15 DIAGNOSIS — E1140 Type 2 diabetes mellitus with diabetic neuropathy, unspecified: Secondary | ICD-10-CM | POA: Diagnosis not present

## 2023-04-15 DIAGNOSIS — I5032 Chronic diastolic (congestive) heart failure: Secondary | ICD-10-CM | POA: Diagnosis present

## 2023-04-15 DIAGNOSIS — T827XXA Infection and inflammatory reaction due to other cardiac and vascular devices, implants and grafts, initial encounter: Secondary | ICD-10-CM | POA: Diagnosis present

## 2023-04-15 DIAGNOSIS — Z8249 Family history of ischemic heart disease and other diseases of the circulatory system: Secondary | ICD-10-CM | POA: Diagnosis not present

## 2023-04-15 DIAGNOSIS — R5381 Other malaise: Secondary | ICD-10-CM | POA: Diagnosis not present

## 2023-04-15 DIAGNOSIS — K5730 Diverticulosis of large intestine without perforation or abscess without bleeding: Secondary | ICD-10-CM | POA: Diagnosis not present

## 2023-04-15 DIAGNOSIS — E1122 Type 2 diabetes mellitus with diabetic chronic kidney disease: Secondary | ICD-10-CM | POA: Diagnosis present

## 2023-04-15 DIAGNOSIS — Z7901 Long term (current) use of anticoagulants: Secondary | ICD-10-CM | POA: Diagnosis not present

## 2023-04-15 DIAGNOSIS — C851A Unspecified b-cell lymphoma, in remission: Secondary | ICD-10-CM | POA: Diagnosis not present

## 2023-04-15 DIAGNOSIS — M1A9XX0 Chronic gout, unspecified, without tophus (tophi): Secondary | ICD-10-CM | POA: Diagnosis not present

## 2023-04-15 DIAGNOSIS — A419 Sepsis, unspecified organism: Secondary | ICD-10-CM | POA: Diagnosis not present

## 2023-04-15 DIAGNOSIS — E119 Type 2 diabetes mellitus without complications: Secondary | ICD-10-CM | POA: Diagnosis not present

## 2023-04-15 DIAGNOSIS — A4181 Sepsis due to Enterococcus: Secondary | ICD-10-CM | POA: Diagnosis present

## 2023-04-15 DIAGNOSIS — E669 Obesity, unspecified: Secondary | ICD-10-CM | POA: Diagnosis present

## 2023-04-15 DIAGNOSIS — I480 Paroxysmal atrial fibrillation: Secondary | ICD-10-CM | POA: Diagnosis present

## 2023-04-15 DIAGNOSIS — I083 Combined rheumatic disorders of mitral, aortic and tricuspid valves: Secondary | ICD-10-CM | POA: Diagnosis not present

## 2023-04-15 DIAGNOSIS — I6529 Occlusion and stenosis of unspecified carotid artery: Secondary | ICD-10-CM | POA: Diagnosis not present

## 2023-04-15 DIAGNOSIS — R7881 Bacteremia: Secondary | ICD-10-CM | POA: Diagnosis not present

## 2023-04-15 DIAGNOSIS — K862 Cyst of pancreas: Secondary | ICD-10-CM | POA: Diagnosis not present

## 2023-04-15 DIAGNOSIS — I428 Other cardiomyopathies: Secondary | ICD-10-CM | POA: Diagnosis present

## 2023-04-15 DIAGNOSIS — Z952 Presence of prosthetic heart valve: Secondary | ICD-10-CM | POA: Diagnosis not present

## 2023-04-15 DIAGNOSIS — I483 Typical atrial flutter: Secondary | ICD-10-CM | POA: Diagnosis not present

## 2023-04-15 DIAGNOSIS — M109 Gout, unspecified: Secondary | ICD-10-CM | POA: Diagnosis present

## 2023-04-15 DIAGNOSIS — B952 Enterococcus as the cause of diseases classified elsewhere: Secondary | ICD-10-CM | POA: Diagnosis not present

## 2023-04-15 DIAGNOSIS — G4733 Obstructive sleep apnea (adult) (pediatric): Secondary | ICD-10-CM | POA: Diagnosis present

## 2023-04-15 DIAGNOSIS — Z1152 Encounter for screening for COVID-19: Secondary | ICD-10-CM | POA: Diagnosis not present

## 2023-04-15 DIAGNOSIS — R6520 Severe sepsis without septic shock: Secondary | ICD-10-CM | POA: Diagnosis not present

## 2023-04-15 DIAGNOSIS — I38 Endocarditis, valve unspecified: Secondary | ICD-10-CM | POA: Diagnosis not present

## 2023-04-15 DIAGNOSIS — I4892 Unspecified atrial flutter: Secondary | ICD-10-CM | POA: Diagnosis present

## 2023-04-15 DIAGNOSIS — Y831 Surgical operation with implant of artificial internal device as the cause of abnormal reaction of the patient, or of later complication, without mention of misadventure at the time of the procedure: Secondary | ICD-10-CM | POA: Diagnosis present

## 2023-04-15 DIAGNOSIS — C8330 Diffuse large B-cell lymphoma, unspecified site: Secondary | ICD-10-CM | POA: Diagnosis present

## 2023-04-15 DIAGNOSIS — Z7984 Long term (current) use of oral hypoglycemic drugs: Secondary | ICD-10-CM | POA: Diagnosis not present

## 2023-04-15 DIAGNOSIS — I871 Compression of vein: Secondary | ICD-10-CM | POA: Diagnosis not present

## 2023-04-15 DIAGNOSIS — E876 Hypokalemia: Secondary | ICD-10-CM | POA: Diagnosis present

## 2023-04-15 DIAGNOSIS — R531 Weakness: Secondary | ICD-10-CM | POA: Diagnosis present

## 2023-04-15 DIAGNOSIS — I25810 Atherosclerosis of coronary artery bypass graft(s) without angina pectoris: Secondary | ICD-10-CM | POA: Diagnosis not present

## 2023-04-15 DIAGNOSIS — E782 Mixed hyperlipidemia: Secondary | ICD-10-CM | POA: Diagnosis present

## 2023-04-15 DIAGNOSIS — I2510 Atherosclerotic heart disease of native coronary artery without angina pectoris: Secondary | ICD-10-CM | POA: Diagnosis present

## 2023-04-15 DIAGNOSIS — I110 Hypertensive heart disease with heart failure: Secondary | ICD-10-CM | POA: Diagnosis not present

## 2023-04-15 DIAGNOSIS — I4821 Permanent atrial fibrillation: Secondary | ICD-10-CM | POA: Diagnosis not present

## 2023-04-15 DIAGNOSIS — Z85828 Personal history of other malignant neoplasm of skin: Secondary | ICD-10-CM | POA: Diagnosis not present

## 2023-04-15 DIAGNOSIS — K219 Gastro-esophageal reflux disease without esophagitis: Secondary | ICD-10-CM | POA: Diagnosis present

## 2023-04-15 DIAGNOSIS — E785 Hyperlipidemia, unspecified: Secondary | ICD-10-CM | POA: Diagnosis not present

## 2023-04-15 DIAGNOSIS — I5042 Chronic combined systolic (congestive) and diastolic (congestive) heart failure: Secondary | ICD-10-CM | POA: Diagnosis not present

## 2023-04-15 DIAGNOSIS — I2582 Chronic total occlusion of coronary artery: Secondary | ICD-10-CM | POA: Diagnosis not present

## 2023-04-15 DIAGNOSIS — N179 Acute kidney failure, unspecified: Secondary | ICD-10-CM | POA: Diagnosis not present

## 2023-04-15 DIAGNOSIS — T829XXA Unspecified complication of cardiac and vascular prosthetic device, implant and graft, initial encounter: Secondary | ICD-10-CM | POA: Diagnosis not present

## 2023-04-15 DIAGNOSIS — I130 Hypertensive heart and chronic kidney disease with heart failure and stage 1 through stage 4 chronic kidney disease, or unspecified chronic kidney disease: Secondary | ICD-10-CM | POA: Diagnosis present

## 2023-04-15 DIAGNOSIS — D649 Anemia, unspecified: Secondary | ICD-10-CM | POA: Diagnosis present

## 2023-04-15 LAB — RESPIRATORY PANEL BY PCR

## 2023-04-15 LAB — BLOOD CULTURE ID PANEL (REFLEXED) - BCID2

## 2023-04-15 LAB — CREATININE, SERUM
Creatinine, Ser: 1.44 mg/dL — ABNORMAL HIGH (ref 0.61–1.24)
GFR, Estimated: 48 mL/min — ABNORMAL LOW (ref >60)

## 2023-04-15 LAB — CBG MONITORING, ED: Glucose-Capillary: 149 mg/dL — ABNORMAL HIGH (ref 70–99)

## 2023-04-15 MED ORDER — GLIMEPIRIDE 2 MG PO TABS
2.0000 mg | ORAL_TABLET | Freq: Every day | ORAL | Status: DC
  Administered 2023-04-15 – 2023-04-16 (×2): 2 mg via ORAL
  Filled 2023-04-15 (×4): qty 1

## 2023-04-15 MED ORDER — PANTOPRAZOLE SODIUM 40 MG PO TBEC
80.0000 mg | DELAYED_RELEASE_TABLET | Freq: Every day | ORAL | Status: DC
  Administered 2023-04-15 – 2023-04-16 (×2): 80 mg via ORAL
  Filled 2023-04-15 (×2): qty 2

## 2023-04-15 MED ORDER — FOLIC ACID 1 MG PO TABS
2.0000 mg | ORAL_TABLET | Freq: Every day | ORAL | Status: DC
  Administered 2023-04-15 – 2023-04-16 (×2): 2 mg via ORAL
  Filled 2023-04-15 (×2): qty 2

## 2023-04-15 MED ORDER — PREGABALIN 75 MG PO CAPS
150.0000 mg | ORAL_CAPSULE | Freq: Every day | ORAL | Status: DC
  Administered 2023-04-15: 150 mg via ORAL
  Filled 2023-04-15: qty 3

## 2023-04-15 MED ORDER — ICOSAPENT ETHYL 1 G PO CAPS
2.0000 g | ORAL_CAPSULE | Freq: Two times a day (BID) | ORAL | Status: DC
  Administered 2023-04-15 – 2023-04-16 (×3): 2 g via ORAL
  Filled 2023-04-15 (×4): qty 2

## 2023-04-15 MED ORDER — ALLOPURINOL 300 MG PO TABS
450.0000 mg | ORAL_TABLET | Freq: Every day | ORAL | Status: DC
  Administered 2023-04-15 – 2023-04-16 (×2): 450 mg via ORAL
  Filled 2023-04-15 (×2): qty 1.5

## 2023-04-15 MED ORDER — SODIUM CHLORIDE 0.9 % IV SOLN
2.0000 g | INTRAVENOUS | Status: DC
  Administered 2023-04-15: 2 g via INTRAVENOUS
  Filled 2023-04-15: qty 20

## 2023-04-15 MED ORDER — ATORVASTATIN CALCIUM 40 MG PO TABS
80.0000 mg | ORAL_TABLET | Freq: Every day | ORAL | Status: DC
  Administered 2023-04-15 – 2023-04-16 (×2): 80 mg via ORAL
  Filled 2023-04-15 (×2): qty 2

## 2023-04-15 MED ORDER — APIXABAN 2.5 MG PO TABS
2.5000 mg | ORAL_TABLET | Freq: Two times a day (BID) | ORAL | Status: DC
  Administered 2023-04-15 – 2023-04-16 (×3): 2.5 mg via ORAL
  Filled 2023-04-15 (×3): qty 1

## 2023-04-15 MED ORDER — METOPROLOL SUCCINATE ER 50 MG PO TB24
25.0000 mg | ORAL_TABLET | Freq: Every day | ORAL | Status: DC
  Administered 2023-04-15 – 2023-04-16 (×2): 25 mg via ORAL
  Filled 2023-04-15 (×2): qty 1

## 2023-04-15 MED ORDER — EZETIMIBE 10 MG PO TABS
10.0000 mg | ORAL_TABLET | Freq: Every day | ORAL | Status: DC
  Administered 2023-04-15 – 2023-04-16 (×2): 10 mg via ORAL
  Filled 2023-04-15 (×2): qty 1

## 2023-04-15 MED ORDER — TORSEMIDE 20 MG PO TABS
40.0000 mg | ORAL_TABLET | Freq: Every morning | ORAL | Status: DC
  Administered 2023-04-15: 40 mg via ORAL
  Filled 2023-04-15 (×2): qty 2

## 2023-04-15 MED ORDER — PREGABALIN 100 MG PO CAPS
100.0000 mg | ORAL_CAPSULE | Freq: Every day | ORAL | Status: DC
  Administered 2023-04-15 – 2023-04-16 (×2): 100 mg via ORAL
  Filled 2023-04-15 (×2): qty 2

## 2023-04-15 MED ORDER — SODIUM CHLORIDE 0.9 % IV SOLN
2.0000 g | Freq: Two times a day (BID) | INTRAVENOUS | Status: DC
  Administered 2023-04-15 – 2023-04-16 (×2): 2 g via INTRAVENOUS
  Filled 2023-04-15 (×2): qty 20

## 2023-04-15 MED ORDER — INSULIN ASPART 100 UNIT/ML IJ SOLN
0.0000 [IU] | Freq: Three times a day (TID) | INTRAMUSCULAR | Status: DC
  Administered 2023-04-16: 2 [IU] via SUBCUTANEOUS
  Filled 2023-04-15: qty 0.15

## 2023-04-15 MED ORDER — PREGABALIN 50 MG PO CAPS
150.0000 mg | ORAL_CAPSULE | Freq: Once | ORAL | Status: AC
  Administered 2023-04-15: 150 mg via ORAL
  Filled 2023-04-15: qty 3

## 2023-04-15 MED ORDER — ACETAMINOPHEN 325 MG PO TABS
650.0000 mg | ORAL_TABLET | Freq: Four times a day (QID) | ORAL | Status: DC | PRN
  Administered 2023-04-15: 650 mg via ORAL
  Filled 2023-04-15: qty 2

## 2023-04-15 MED ORDER — ALUM HYDROXIDE-MAG CARBONATE 95-358 MG/15ML PO SUSP: 15.0000 mL | ORAL | Status: DC | PRN

## 2023-04-15 MED ORDER — ONDANSETRON HCL 4 MG/2ML IJ SOLN: 4.0000 mg | Freq: Four times a day (QID) | INTRAMUSCULAR | Status: DC | PRN

## 2023-04-15 MED ORDER — ONDANSETRON HCL 4 MG PO TABS: 4.0000 mg | ORAL_TABLET | Freq: Four times a day (QID) | ORAL | Status: DC | PRN

## 2023-04-15 MED ORDER — INSULIN ASPART 100 UNIT/ML IJ SOLN
0.0000 [IU] | Freq: Every day | INTRAMUSCULAR | Status: DC
  Filled 2023-04-15: qty 0.05

## 2023-04-15 MED ORDER — ALBUTEROL SULFATE (2.5 MG/3ML) 0.083% IN NEBU: 2.5000 mg | INHALATION_SOLUTION | RESPIRATORY_TRACT | Status: DC | PRN

## 2023-04-15 MED ORDER — NITROGLYCERIN 0.4 MG SL SUBL: 0.4000 mg | SUBLINGUAL_TABLET | SUBLINGUAL | Status: DC | PRN

## 2023-04-15 MED ORDER — ACETAMINOPHEN 325 MG PO TABS: 650.0000 mg | ORAL_TABLET | Freq: Four times a day (QID) | ORAL | Status: DC | PRN

## 2023-04-15 MED ORDER — TRAZODONE HCL 50 MG PO TABS: 25.0000 mg | ORAL_TABLET | Freq: Every evening | ORAL | Status: DC | PRN

## 2023-04-15 MED ORDER — VITAMIN C 500 MG PO TABS
500.0000 mg | ORAL_TABLET | Freq: Every day | ORAL | Status: DC
  Administered 2023-04-15 – 2023-04-16 (×2): 500 mg via ORAL
  Filled 2023-04-15 (×2): qty 1

## 2023-04-15 MED ORDER — POTASSIUM CHLORIDE CRYS ER 20 MEQ PO TBCR
40.0000 meq | EXTENDED_RELEASE_TABLET | Freq: Every day | ORAL | Status: DC
  Administered 2023-04-15 – 2023-04-16 (×2): 40 meq via ORAL
  Filled 2023-04-15 (×2): qty 2

## 2023-04-15 MED ORDER — SODIUM CHLORIDE 0.9 % IV SOLN
2.0000 g | Freq: Four times a day (QID) | INTRAVENOUS | Status: DC
  Administered 2023-04-15 – 2023-04-16 (×5): 2 g via INTRAVENOUS
  Filled 2023-04-15 (×8): qty 2000

## 2023-04-15 MED ORDER — ACETAMINOPHEN 650 MG RE SUPP: 650.0000 mg | Freq: Four times a day (QID) | RECTAL | Status: DC | PRN

## 2023-04-15 MED ORDER — DAPAGLIFLOZIN PROPANEDIOL 10 MG PO TABS
10.0000 mg | ORAL_TABLET | Freq: Every day | ORAL | Status: DC
  Administered 2023-04-15 – 2023-04-16 (×2): 10 mg via ORAL
  Filled 2023-04-15 (×4): qty 1

## 2023-04-15 MED ORDER — FENOFIBRATE 160 MG PO TABS
160.0000 mg | ORAL_TABLET | Freq: Every day | ORAL | Status: DC
  Administered 2023-04-15 – 2023-04-16 (×2): 160 mg via ORAL
  Filled 2023-04-15 (×2): qty 1

## 2023-04-15 MED ORDER — CETIRIZINE HCL 5 MG/5ML PO SOLN
5.0000 mg | Freq: Once | ORAL | Status: AC
  Administered 2023-04-15: 5 mg via ORAL
  Filled 2023-04-15 (×2): qty 5

## 2023-04-15 NOTE — ED Provider Notes (Signed)
Accepted handoff at shift change from [employer]. Please see prior provider note for more detail.   Briefly: Patient is 82 y.o.   Clinical Course as of 04/15/23 1521  Mon Apr 14, 2023  1927 Watcher  Sick since thursday Full body sxs.  On doxy  GCS 15  [CC]  Tue Apr 15, 2023  0404 Consulted with [facility] Physician's Access Line. Reported no beds available for transfer at this time but patient placed on wait list.  [LR]  [phone] 82 yo guy, recent pacemaker placement at [facility] on the 1st. 3 days of fever, weakness, not feeling well. No source of infection. Can't find any other reason for him to be sick. Spoke with Hospitalist about admission, who recommended admission at [facility]. No beds available at [facility]. If no one accepting by noon, call the PALS line again. [CP]  1200 Dr Maudry Mayhew with cardiology as admitting physician, no bed available on recheck [CP]    Clinical Course User Index [CC] Glyn Ade, MD [CP] Sharae Zappulla H, PA-C [LR] Roemhildt, Lorin T, PA-C    Reassessed at 12 PM per previous plan and still no beds available at [facility].  Attempted admission to hospitalist, hospitalist declined at this time, reporting no additional hospital management patient will continue to be monitored, treated for suspected possible pacemaker associated infection.  He is continue to await admission.  Recommend reassessment for beds at [facility] later this evening, and disposition planning at that time.  3:21 PM Care of Richard [name] transferred to [facility] and Dr. Rhunette Croft at the end of my shift as the patient will require reassessment once labs/imaging have resulted. Patient presentation, ED course, and plan of care discussed with review of all pertinent labs and imaging. Please see his/her note for further details regarding further ED course and disposition. Plan at time of handoff is as above. This may be altered or completely changed at the discretion of the oncoming  team pending results of further workup.  Additionally blood cultures have now resulted Enterococcus faecalis, possible that his implanted cardiac device is not the source of the infection, more suspicious of intra-abdominal source at this point.  May consider changing antibiotics, and reconsulting for admission to our hospitalist given no noted association with his cardiac device.    Olene Floss, PA-C 04/15/23 1521    Elayne Snare K, DO 04/15/23 1525

## 2023-04-15 NOTE — H&P (Signed)
History and Physical  [name] LKG:[id number] DOB: 1940/07/06 DOA: 04/14/2023  PCP: Donato Schultz, DO   Chief Complaint: Weakness, fever  HPI: Richard [name] is a 82 y.o. male with medical history significant for skin keratosis, non-insulin type 2 diabetes, CAD, diffuse large B-cell lymphoma, pacemaker with recent reimplantation 04/01/2023 being admitted to the hospital with bacteremia and sepsis.  Patient states he has been in his usual state of health until 3 to 4 days ago when he started feeling unwell, with some mild congestion like symptoms, but mainly severe weakness and lethargy.  He had bodyaches, and a fever at home when he first presented to the emergency department 10/14.  He was seen by his PCP on 10/11 and given prior history of rapid progression to sepsis he was prescribed doxycycline 100 mg p.o. twice daily x 7 days.  Started taking this medication, but it did not help much.  Rapid flu and COVID were both negative in the office that day.  ED Course: He was evaluated in the emergency department starting on the evening of 10/14.  Fever up to 102.  Initially also tachycardic, blood pressure has been low but stable.  Lab work including CBC and CMP essentially unremarkable.  Lactate 1.7.  Blood cultures were done, and positive for Enterococcus faecalis.  IV antibiotics were adjusted, and hospitalist was contacted for admission.  Initially the patient had been accepted to [facility] since he had his pacemaker implanted there recently, but they have no beds.  Review of Systems: Please see HPI for pertinent positives and negatives. A complete 10 system review of systems are otherwise negative.  Past Medical History:  Diagnosis Date   Anemia    Arthritis    hips   Axillary adenopathy 02/25/2017   Bradycardia    a. holter monitor has demonstrated HRs in 30s and Weinkibach    CAD (coronary artery disease)    a. s/p CABG 2001  b.  07/28/2017 cath:   Severe three-vessel  native CAD with total occlusion of LAD, ramus intermedius, first OM and RCA, patent RIMA to PDA, LIMA to LAD, sequential SVG to ramus intermedius and first OM.     Chronic lower back pain    Diffuse large B cell lymphoma (HCC)    Diverticulosis    Esophageal stricture    GERD (gastroesophageal reflux disease)    History of gout    HTN (hypertension)    Mixed hyperlipidemia    OSA on CPAP    with 2L O2 at night   Pancytopenia (HCC)    a. related to chemo therapy for B cell lymphoma   Peptic stricture of esophagus    Presence of permanent cardiac pacemaker    sees Dr. Clayborn Bigness pacemaker   SCC (squamous cell carcinoma) 01/31/2021   R zygoma, EDC   SCC (squamous cell carcinoma) 01/31/2021   L post ankle, EDC   SCC (squamous cell carcinoma) 01/31/2021   L popliteal, EDC   Severe aortic stenosis    Spinal stenosis    Squamous cell carcinoma of skin 03/22/2009   Right mandible. SCCis, hypertrophic.    Squamous cell carcinoma of skin 12/25/2021   R lateral cheek, EDC   Squamous cell carcinoma of skin 12/25/2021   R postauricular neck, EDC   Squamous cell carcinoma of skin 12/25/2021   R forearm sup, EDC   Squamous cell carcinoma of skin 12/25/2021   R wrist, EDC   Type II diabetes mellitus (HCC)  Wears dentures    partial upper   Past Surgical History:  Procedure Laterality Date   APPENDECTOMY  ~ 1952   BACK SURGERY     CATARACT EXTRACTION W/PHACO Left 11/06/2016   Procedure: CATARACT EXTRACTION PHACO AND INTRAOCULAR LENS PLACEMENT (IOC);  Surgeon: Sallee Lange, MD;  Location: [facility] ORS;  Service: Ophthalmology;  Laterality: Left;  Lot # [id number] H [place]: 01:09.4 AP%:25.2 CDE: 30.64   CATARACT EXTRACTION W/PHACO Right 12/04/2016   Procedure: CATARACT EXTRACTION PHACO AND INTRAOCULAR LENS PLACEMENT (IOC);  Surgeon: Sallee Lange, MD;  Location: [facility] ORS;  Service: Ophthalmology;  Laterality: Right;  [place] 1:25.9 AP% 24.1 CDE 39.10 Fluid Pack lot # [id number]    COLONOSCOPY W/ BIOPSIES AND POLYPECTOMY  07/02/2011   CORONARY ANGIOPLASTY  07/02/1991   CORONARY ANGIOPLASTY WITH STENT PLACEMENT  05/31/1997   "1"   CORONARY ARTERY BYPASS GRAFT  03/01/2000   "CABG X5"   ECTROPION REPAIR Right 09/01/2018   Procedure: REPAIR OF ECTROPION BILATERAL upper and lower;  Surgeon: Imagene Riches, MD;  Location: [facility];  Service: Ophthalmology;  Laterality: Right;  Diabetic - oral meds sleep apnea   ESOPHAGEAL DILATION  X 3-4   Dr. Victorino Dike; "last one was in the 1990's"   ESOPHAGOGASTRODUODENOSCOPY     multiple   FLEXIBLE SIGMOIDOSCOPY     multiple   HYDRADENITIS EXCISION Left 02/25/2017   Procedure: EXCISION DEEP LEFT AXILLARY LYMPH NODE;  Surgeon: Claud Kelp, MD;  Location: [facility] OR;  Service: General;  Laterality: Left;   INTRAOPERATIVE TRANSTHORACIC ECHOCARDIOGRAM N/A 09/09/2017   Procedure: INTRAOPERATIVE TRANSTHORACIC ECHOCARDIOGRAM;  Surgeon: Kathleene Hazel, MD;  Location: [facility] OR;  Service: Open Heart Surgery;  Laterality: N/A;   KNEE ARTHROSCOPY Left 07/01/2009   meniscus repair   LEFT HEART CATHETERIZATION WITH CORONARY/GRAFT ANGIOGRAM N/A 03/16/2014   Procedure: LEFT HEART CATHETERIZATION WITH Isabel Caprice;  Surgeon: Kathleene Hazel, MD;  Location: [facility] CATH LAB;  Service: Cardiovascular;  Laterality: N/A;   LUMBAR LAMINECTOMY/DECOMPRESSION MICRODISCECTOMY Right 06/17/2013   Procedure: LUMBAR LAMINECTOMY MICRODISCECTOMY L4-L5 RIGHT EXCISION OF SYNOVIAL CYST RIGHT   (1 LEVEL) RIGHT PARTIAL FACETECTOMY;  Surgeon: Jacki Cones, MD;  Location: [facility] ORS;  Service: Orthopedics;  Laterality: Right;   MYELOGRAM  04/06/2013   lumbar, Dr Darrelyn Hillock   ORBITAL LESION EXCISION Right 09/01/2018   Procedure: ORBITOTOMY WITHOUT BONE FLAP WITH REMOVAL OF LESION RIGHT;  Surgeon: Imagene Riches, MD;  Location: [facility];  Service: Ophthalmology;  Laterality: Right;   PACEMAKER IMPLANT N/A 09/10/2017   Procedure:  PACEMAKER IMPLANT;  Surgeon: Duke Salvia, MD;  Location: [facility] INVAS[facility] CV LAB;  Service: Cardiovascular;  Laterality: N/A;   PANENDOSCOPY     PERIPHERAL VASCULAR BALLOON ANGIOPLASTY Left 03/16/2021   Procedure: PERIPHERAL VASCULAR BALLOON ANGIOPLASTY;  Surgeon: Leonie Douglas, MD;  Location: [facility] INVASIVE CV LAB;  Service: Cardiovascular;  Laterality: Left;  subclavian  vein   PORTACATH PLACEMENT N/A 03/06/2017   Procedure: INSERTION PORT-A-CATH AND ASPIRATE SEROMA LEFT AXILLA;  Surgeon: Claud Kelp, MD;  Location: [facility] OR;  Service: General;  Laterality: N/A;   RIGHT/LEFT HEART CATH AND CORONARY/GRAFT ANGIOGRAPHY N/A 07/28/2017   Procedure: RIGHT/LEFT HEART CATH AND CORONARY/GRAFT ANGIOGRAPHY;  Surgeon: Tonny Bollman, MD;  Location: [facility] INVASIVE CV LAB;  Service: Cardiovascular;  Laterality: N/A;   SHOULDER SURGERY Right 08/30/2010   screws placed; "tendons tore off"   SKIN CANCER EXCISION Right    "neck"   TONSILLECTOMY  ~ 1954   TOTAL KNEE ARTHROPLASTY Left  06/29/2019   Procedure: TOTAL KNEE ARTHROPLASTY;  Surgeon: Durene Romans, MD;  Location: [facility] ORS;  Service: Orthopedics;  Laterality: Left;  70 mins   TRANSCATHETER AORTIC VALVE REPLACEMENT, TRANSFEMORAL N/A 09/09/2017   Procedure: TRANSCATHETER AORTIC VALVE REPLACEMENT, TRANSFEMORAL;  Surgeon: Kathleene Hazel, MD;  Location: [facility] OR;  Service: Open Heart Surgery;  Laterality: N/A;   UPPER EXTREMITY VENOGRAPHY Left 02/15/2021   Procedure: CENTRAL VENO;  Surgeon: Leonie Douglas, MD;  Location: [facility] INVASIVE CV LAB;  Service: Cardiovascular;  Laterality: Left;   UPPER EXTREMITY VENOGRAPHY N/A 03/16/2021   Procedure: UPPER EXTREMITY VENOGRAPHY;  Surgeon: Leonie Douglas, MD;  Location: [facility] INVASIVE CV LAB;  Service: Cardiovascular;  Laterality: N/A;   UPPER GI ENDOSCOPY  07/02/2011   Gastritis; Dr Leone Payor   VASECTOMY     wireless pacemaker placed      Social History:  reports that he has never smoked. He has never used  smokeless tobacco. He reports that he does not drink alcohol and does not use drugs.   Allergies  Allergen Reactions   Ace Inhibitors Swelling and Other (See Comments)    Angioedema    Benazepril Swelling and Other (See Comments)    Angioedema; he is not a candidate for any angiotensin receptor blockers because of this significant allergic reaction. Because of a history of documented adverse serious drug reaction;Medi Alert bracelet  is recommended   Entresto [Sacubitril-Valsartan] Swelling and Other (See Comments)    First-in-Class Angiotensin Receptor Neprilysin Inhibitor- Med was "red-flagged" by the patient's pharmacy for him to NOT take!   Hctz [Hydrochlorothiazide] Anaphylaxis and Swelling    Tongue and lip swelling    Aspirin Other (See Comments)    Gastritis, can aspirin not take 325 mg aspirin    Family History  Problem Relation Age of Onset   Stroke Father    Hypertension Father    Pancreatic cancer Mother    Diabetes Maternal Grandmother    Stroke Maternal Grandmother    Heart attack Paternal Grandmother    Colon cancer Neg Hx    Esophageal cancer Neg Hx    Rectal cancer Neg Hx    Stomach cancer Neg Hx    Ulcers Neg Hx      Prior to Admission medications   Medication Sig Start Date End Date Taking? Authorizing Provider  acetaminophen (TYLENOL) 325 MG tablet Take 650 mg by mouth at bedtime as needed for moderate pain or headache.   Yes [provider]  allopurinol (ZYLOPRIM) 300 MG tablet Take 450 mg by mouth daily.   Yes [provider]  aluminum hydroxide-magnesium carbonate (GAVISCON) 95-358 MG/15ML SUSP Take 15 mLs by mouth as needed for indigestion or heartburn.   Yes [provider]  apixaban (ELIQUIS) 2.5 MG TABS tablet TAKE 1 TABLET BY MOUTH TWICE A DAY 11/26/22  Yes Hochrein, Fayrene Fearing, MD  Ascorbic Acid (VITAMIN C PO) Take 1 tablet by mouth daily with breakfast.   Yes [provider]  atorvastatin (LIPITOR) 80 MG tablet TAKE  1 TABLET BY MOUTH EVERYDAY AT BEDTIME 12/12/22  Yes Hochrein, Fayrene Fearing, MD  Azelastine HCl 137 MCG/SPRAY SOLN PLACE 2 SPRAYS INTO BOTH NOSTRILS AT BEDTIME AS NEEDED FOR RHINITIS OR ALLERGIES. 02/17/23  Yes Seabron Spates R, DO  Coenzyme Q10 (CO Q-10 PO) Take 1 capsule by mouth daily.   Yes [provider]  doxycycline (VIBRA-TABS) 100 MG tablet Take 1 tablet (100 mg total) by mouth 2 (two) times daily. 04/11/23  Yes Ria Clock  Margarita Grizzle, FNP  Dulaglutide (TRULICITY) 3 MG/0.5ML SOPN Inject 3 mg into the skin once a week. 02/17/23  Yes Seabron Spates R, DO  esomeprazole (NEXIUM) 40 MG capsule Take 1 capsule (40 mg total) by mouth daily. Patient taking differently: Take 40 mg by mouth daily before breakfast. 10/15/12  Yes Pecola Lawless, MD  ezetimibe (ZETIA) 10 MG tablet Take 1 tablet (10 mg total) by mouth daily. 10/14/22  Yes Lowne Chase, Yvonne R, DO  FARXIGA 10 MG TABS tablet Take 10 mg by mouth in the morning.   Yes [provider]  fenofibrate 160 MG tablet Take 1 tablet (160 mg total) by mouth daily. 03/25/23  Yes Seabron Spates R, DO  folic acid (FOLVITE) 1 MG tablet TAKE 2 TABLETS BY MOUTH EVERY DAY 06/27/22  Yes Ennever, Rose Phi, MD  glimepiride (AMARYL) 2 MG tablet Take 1 tablet (2 mg total) by mouth in the morning and at bedtime. 02/17/23  Yes Donato Schultz, DO  icosapent Ethyl (VASCEPA) 1 g capsule Take 2 capsules (2 g total) by mouth 2 (two) times daily. 02/17/23  Yes Lowne Chase, Yvonne R, DO  KLOR-CON M20 20 MEQ tablet TAKE 2 TABLETS BY MOUTH DAILY 12/10/22  Yes Hochrein, Fayrene Fearing, MD  Menthol-Camphor (ICY HOT ADVANCED PAIN RELIEF EX) Apply 1 application  topically daily as needed (to painful sites).   Yes [provider]  metoprolol succinate (TOPROL-XL) 25 MG 24 hr tablet TAKE 1 TABLET (25 MG TOTAL) BY MOUTH DAILY. 01/30/23  Yes Hochrein, Fayrene Fearing, MD  nitroGLYCERIN (NITROSTAT) 0.4 MG SL tablet PLACE 1 TABLET UNDER THE TONGUE EVERY 5 MINUTES AS  NEEDED FOR CHEST PAIN. 08/22/22  Yes Rollene Rotunda, MD  pregabalin (LYRICA) 100 MG capsule Take 1 capsule (100 mg total) by mouth daily. At noon 12/27/22  Yes Lowne Florina Ou R, DO  pregabalin (LYRICA) 150 MG capsule Take 1 capsule (150 mg total) by mouth at bedtime. (Take 100mg  daily at noon) 12/27/22  Yes Zola Button, Grayling Congress, DO  psyllium (METAMUCIL) 58.6 % powder Take 1 packet by mouth daily as needed (for constipation- mix and drink).   Yes [provider]  torsemide (DEMADEX) 20 MG tablet Take 2 tablets (40 mg total) by mouth in the morning. Take extra tablet for weight gain more then 2 to 3 lbs in 24 hours. 02/17/23  Yes Seabron Spates R, DO  glucose blood (FREESTYLE LITE) test strip USE TO TEST BLOOD SUGAR ONCE A DAY. DX CODE: E11.9 07/15/22   Zola Button, Myrene Buddy R, DO  Lancets (FREESTYLE) lancets USE ONCE A DAY TO CHECK BLOOD SUGAR. 11/22/22   Zola Button, Grayling Congress, DO  senna (SENOKOT) 8.6 MG TABS tablet Take 2 tablets (17.2 mg total) by mouth at bedtime. This is for constipation.  Stop taking if you start having diarrhea. Patient not taking: Reported on 04/14/2023 01/23/22   Elease Etienne, MD    Physical Exam: BP 105/69 (BP Location: Right Arm)   Pulse 83   Temp 98.1 F (36.7 C) (Oral)   Resp 17   Ht 6' (1.829 m)   Wt 105 kg   SpO2 95%   BMI 31.39 kg/m   General:  Alert, oriented, calm, in no acute distress, patient is ambulatory, pleasant and cooperative and a good historian.  He looks nontoxic and comfortable. Eyes: EOMI, clear conjuctivae, white sclerea Neck: supple, no masses, trachea mildline, he has a palpable soft nontender mass at the base of the right  neck, patient states this is stable and chronic Cardiovascular: RRR, no murmurs or rubs, no peripheral edema  Respiratory: clear to auscultation bilaterally, no wheezes, no crackles  Abdomen: soft, nontender, nondistended, normal bowel tones heard  Skin: Diffusely with very dry skin, with actinic  changes.  Right anterior chest pacemaker site is palpated, incision is clean, dry, intact, nontender, no surrounding erythema or other evidence of complication Musculoskeletal: no joint effusions, normal range of motion  Psychiatric: appropriate affect, normal speech  Neurologic: extraocular muscles intact, clear speech, moving all extremities with intact sensorium         Labs on Admission:  Basic Metabolic Panel: Recent Labs  Lab 04/10/23 1020 04/14/23 1915 04/15/23 0730  NA 141 134*  --   K 4.5 3.5  --   CL 98 100  --   CO2 32 23  --   GLUCOSE 138* 152*  --   BUN 39* 22  --   CREATININE 1.53* 1.55* 1.44*  CALCIUM 10.4 8.3*  --    Liver Function Tests: Recent Labs  Lab 04/10/23 1020 04/14/23 1915  AST 21 40  ALT 23 36  ALKPHOS 69 60  BILITOT 0.8 0.8  PROT 6.8 7.0  ALBUMIN 4.5 3.7   No results for input(s): "LIPASE", "AMYLASE" in the last 168 hours. No results for input(s): "AMMONIA" in the last 168 hours. CBC: Recent Labs  Lab 04/10/23 1020 04/14/23 1915  WBC 9.3 6.6  NEUTROABS 6.2 5.1  HGB 14.6 13.1  HCT 45.5 40.3  MCV 90.8 90.4  PLT 253.0 184   Cardiac Enzymes: No results for input(s): "CKTOTAL", "CKMB", "CKMBINDEX", "TROPONINI" in the last 168 hours.  BNP (last 3 results) No results for input(s): "BNP" in the last 8760 hours.  ProBNP (last 3 results) No results for input(s): "PROBNP" in the last 8760 hours.  CBG: Recent Labs  Lab 04/14/23 2322  GLUCAP 101*    Radiological Exams on Admission: CT CHEST ABDOMEN PELVIS W CONTRAST  Result Date: 04/15/2023 CLINICAL DATA:  Sepsis.  History of lymphoma. EXAM: CT CHEST, ABDOMEN, AND PELVIS WITH CONTRAST TECHNIQUE: Multidetector CT imaging of the chest, abdomen and pelvis was performed following the standard protocol during bolus administration of intravenous contrast. RADIATION DOSE REDUCTION: This exam was performed according to the departmental dose-optimization program which includes automated  exposure control, adjustment of the mA and/or kV according to patient size and/or use of iterative reconstruction technique. CONTRAST:  80mL OMNIPAQUE IOHEXOL 300 MG/ML  SOLN COMPARISON:  Chest CT 10/24/2020.  PET-CT 02/24/2018. FINDINGS: CT CHEST FINDINGS Cardiovascular: Heart is enlarged. Pacemaker and aortic valve are again noted. Aorta is normal in size. There are atherosclerotic calcifications of the aorta and coronary arteries. There is no pericardial effusion. Mediastinum/Nodes: There is an enlarged high left paratracheal lymph node measuring 14 mm short axis image 2/12 which has increased in size. There are no other enlarged mediastinal, hilar or axillary lymph nodes present. The visualized esophagus and thyroid gland are within normal limits. Lungs/Pleura: There is minimal atelectasis in the lung bases. There some minimal peripheral reticular opacities in both lung bases as well. There is no focal lung infiltrate, pleural effusion or pneumothorax. There is scarring in both lung apices. Musculoskeletal: Sternotomy wires are present. No acute fracture or focal osseous lesion identified. Degenerative changes affect the spine. CT ABDOMEN PELVIS FINDINGS Hepatobiliary: No focal liver abnormality is seen. No gallstones, gallbladder wall thickening, or biliary dilatation. Pancreas: Rounded low-attenuation/cystic areas measuring up to 11 mm in the pancreatic tail  appear more prominent when compared to the prior study. No pancreatic ductal dilatation or surrounding inflammatory changes. Spleen: Normal in size without focal abnormality. Adrenals/Urinary Tract: Trabeculated bladder wall is seen near the dome of the bladder. No bladder calculi are seen. The bladder is distended. The kidneys and adrenal glands are within normal limits. Stomach/Bowel: Stomach is within normal limits. No evidence of bowel wall thickening, distention, or inflammatory changes. The appendix is not seen. There is sigmoid colon diverticulosis.  Vascular/Lymphatic: Aortic atherosclerosis. Aorta and IVC are normal in size. There is an enlarged right inguinal lymph node measuring 1.0 x 2.3 cm which is new from prior. Reproductive: Prostate gland is enlarged. Other: No abdominal wall hernia or abnormality. No abdominopelvic ascites. Musculoskeletal: No acute or significant osseous findings. IMPRESSION: 1. New enlarged left paratracheal lymph node and right inguinal lymph node. Findings may be related to patient's history of lymphoma. 2. Cardiomegaly. 3. Minimal peripheral reticular opacities in the lung bases may represent mild interstitial lung disease. 4. Trabeculated bladder wall near the dome of the bladder. Correlate clinically for cystitis. 5. Prostate gland is enlarged. 6. Colonic diverticulosis without evidence for diverticulitis. 7. Cystic areas in the pancreatic tail appear more prominent when compared to the prior. This can be further evaluated with MRI. Aortic Atherosclerosis (ICD10-I70.0). Electronically Signed   By: Darliss Cheney M.D.   On: 04/15/2023 01:21   DG Chest Port 1 View  Result Date: 04/14/2023 CLINICAL DATA:  Sepsis EXAM: PORTABLE CHEST 1 VIEW COMPARISON:  06/03/2022 FINDINGS: Right-sided pacing device with leads projecting over right atrium and right ventricle. Post sternotomy changes and valve prosthesis. Previously noted lead this pacemaker no longer visualized. No focal airspace disease or effusion. Stable cardiomediastinal silhouette with aortic atherosclerosis. IMPRESSION: No active disease. Electronically Signed   By: Jasmine Pang M.D.   On: 04/14/2023 22:06    Assessment/Plan Quandell Abellera Luebke is a 82 y.o. male with medical history significant for skin keratosis, non-insulin type 2 diabetes, CAD, diffuse large B-cell lymphoma, pacemaker with recent reimplantation 04/01/2023 being admitted to the hospital with bacteremia and sepsis.    Sepsis and bacteremia-meeting criteria with fever, tachycardia, and found to have  bacteremia.  Unclear source of his bacteremia.  Initial blood culture positive for Enterococcus faecalis. -Inpatient admission to progressive -Obtain repeat blood cultures -Continue ampicillin and Rocephin -EDP discussed with infectious disease, who will consult -2D echo  Type 2 diabetes-non-insulin-dependent -Carb controlled diet -Continue home Amaryl and Farxiga -Moderate dose sliding scale insulin  Gout-allopurinol  CKD 3-Appears to be at baseline  Hyperlipidemia-continue Zetia and fenofibrate, Lipitor  Diffuse large B-cell lymphoma-currently under the care of Dr. Myna Hidalgo, receives [place] every 3 months, next dose due November 2024.  Patient noted to have new lymphadenopathy on CT today. -Secure chat message sent to Dr. Myna Hidalgo requesting consultation  GERD-omeprazole  DVT prophylaxis: Eliquis    Code Status: Full Code  Consults called: Oncology as above  Admission status: The appropriate patient status for this patient is INPATIENT. Inpatient status is judged to be reasonable and necessary in order to provide the required intensity of service to ensure the patient's safety. The patient's presenting symptoms, physical exam findings, and initial radiographic and laboratory data in the context of their chronic comorbidities is felt to place them at high risk for further clinical deterioration. Furthermore, it is not anticipated that the patient will be medically stable for discharge from the hospital within 2 midnights of admission.    I certify that at the point of  admission it is my clinical judgment that the patient will require inpatient hospital care spanning beyond 2 midnights from the point of admission due to high intensity of service, high risk for further deterioration and high frequency of surveillance required  Time spent: 59 minutes  Kalijah Zeiss Sharlette Dense MD [facility] Pager [phone]  If 7PM-7AM, please contact night-coverage [email or web address] Password  [facility]  04/15/2023, 5:28 PM

## 2023-04-15 NOTE — ED Notes (Signed)
Pt up in chair, states bed is uncomfortable for his back

## 2023-04-15 NOTE — Progress Notes (Signed)
PHARMACY - PHYSICIAN COMMUNICATION CRITICAL VALUE ALERT - BLOOD CULTURE IDENTIFICATION (BCID)  Richard [name] is an 82 y.o. male who presented to [facility] on 04/14/2023 with a chief complaint of  Chief Complaint  Patient presents with   Weakness     Assessment:  possible pacemaker infection   Name of physician (or Provider) Contacted:  - ID service, Dr. Thedore Mins  Current antibiotics:  - Vancomycin and ceftriaxone  Changes to prescribed antibiotics recommended:  - adjust antibiotics to ampicillin and ceftriaxone   Results for orders placed or performed during the hospital encounter of 04/14/23  Blood Culture ID Panel (Reflexed) (Collected: 04/14/2023  7:29 PM)  Result Value Ref Range   Enterococcus faecalis DETECTED (A) NOT DETECTED   Enterococcus Faecium NOT DETECTED NOT DETECTED   Listeria monocytogenes NOT DETECTED NOT DETECTED   Staphylococcus species NOT DETECTED NOT DETECTED   Staphylococcus aureus (BCID) NOT DETECTED NOT DETECTED   Staphylococcus epidermidis NOT DETECTED NOT DETECTED   Staphylococcus lugdunensis NOT DETECTED NOT DETECTED   Streptococcus species NOT DETECTED NOT DETECTED   Streptococcus agalactiae NOT DETECTED NOT DETECTED   Streptococcus pneumoniae NOT DETECTED NOT DETECTED   Streptococcus pyogenes NOT DETECTED NOT DETECTED   A.calcoaceticus-baumannii NOT DETECTED NOT DETECTED   Bacteroides fragilis NOT DETECTED NOT DETECTED   Enterobacterales NOT DETECTED NOT DETECTED   Enterobacter cloacae complex NOT DETECTED NOT DETECTED   Escherichia coli NOT DETECTED NOT DETECTED   Klebsiella aerogenes NOT DETECTED NOT DETECTED   Klebsiella oxytoca NOT DETECTED NOT DETECTED   Klebsiella pneumoniae NOT DETECTED NOT DETECTED   Proteus species NOT DETECTED NOT DETECTED   Salmonella species NOT DETECTED NOT DETECTED   Serratia marcescens NOT DETECTED NOT DETECTED   Haemophilus influenzae NOT DETECTED NOT DETECTED   Neisseria meningitidis NOT DETECTED NOT  DETECTED   Pseudomonas aeruginosa NOT DETECTED NOT DETECTED   Stenotrophomonas maltophilia NOT DETECTED NOT DETECTED   Candida albicans NOT DETECTED NOT DETECTED   Candida auris NOT DETECTED NOT DETECTED   Candida glabrata NOT DETECTED NOT DETECTED   Candida krusei NOT DETECTED NOT DETECTED   Candida parapsilosis NOT DETECTED NOT DETECTED   Candida tropicalis NOT DETECTED NOT DETECTED   Cryptococcus neoformans/gattii NOT DETECTED NOT DETECTED   Vancomycin resistance NOT DETECTED NOT DETECTED    Adalberto Cole, PharmD, BCPS 04/15/2023 3:46 PM

## 2023-04-15 NOTE — ED Notes (Signed)
Pt brought his home CPAP machine, pt given sterile water for device, pt connected himself to his machine so he may rest throughout the night

## 2023-04-15 NOTE — ED Provider Notes (Signed)
Pt has positive blood cultures. Pt given antibiotics.  Dr. Rhunette Croft spoke with Hospitalist who will admit for Sepsis.    Elson Areas, [place] 04/15/23 1900    Derwood Kaplan, MD 04/15/23 2155

## 2023-04-15 NOTE — Inpatient Diabetes Management (Signed)
Inpatient Diabetes Program Recommendations  AACE/ADA: New Consensus Statement on Inpatient Glycemic Control (2015)  Target Ranges:  Prepandial:   less than 140 mg/dL      Peak postprandial:   less than 180 mg/dL (1-2 hours)      Critically ill patients:  140 - 180 mg/dL   Lab Results  Component Value Date   GLUCAP 101 (H) 04/14/2023   HGBA1C 9.1 (H) 04/10/2023    Review of Glycemic Control  Diabetes history: DM2 Outpatient Diabetes medications: Farxiga 10 daily, Amaryl 2 mg BID, Trulicity 3 mg weekly Current orders for Inpatient glycemic control: Amaryl 2 mg BID, Farxiga 10 daily  HgbA1C - 9.1%  Inpatient Diabetes Program Recommendations:     Consider adding Novolog 0-9 units TID with meals.  Continue to follow.  Thank you. Ailene Ards, RD, LDN, CDCES Inpatient Diabetes Coordinator [phone]

## 2023-04-15 NOTE — ED Notes (Addendum)
7:07 PM  Report received from previous RN. This RN assumes care of the patient.   7:57 PM  Patient reports having fevers at 102.0, chills, runny nose, HA, eye itching and watering diarrhea, decreased appetite that progressively worsened since last Thursday. Patient saw primary care physician on last Friday and placed on antibiotics, but patient reports that symptoms progressively worsened. Patient recently had pacemaker placed x 2 weeks ago. Blood culture set revealed positive for enterococcus faecalis. Patient is alert and oriented x 4. He has equal rise and fall of the chest wall with clear lung sounds. He currently denies pain at this time. He denies nausea, vomiting, abdominal pain, CP, SOB, HA, diarrhea. Last BM earlier today, and states that he has went multiple times. Vital signs updated. Pending admission bed. Call light in reach. Bed in lowest position. Patient does endorse having runny nose and eye watering. Requests medicine to help with symptoms.   10:32 PM  Patient sitting up in chair bedside stretcher watching tv. He denies any needs at this time. Call light in reach. Bed in lowest position. Pending admission to hospital.   12:25 AM  Patient assisted with CPAP machine. Patient laying in bed with equal rise and fall of the chest wall. Patient in NAD. No further needs voiced by patient at this time. Call light in reach. Bed in lowest position. Plan of care ongoing.   1:10 AM  Patient resting in stretcher with even respirations. Patient denies any needs at this time. Call light in reach. Bed in lowest position. Plan of care ongoing.   2:37 AM  Patient sleeping with equal rise and fall of the chest wall. Patient in NAD. No needs at this time. Call light in reach. Bed in lowest position. Plan of care ongoing.   3:44 AM  Patient sleeping with even respirations. Patient in NAD. No needs at this time. Call light in reach. Bed in lowest position. Pending admission to hospital.     4:45 AM  Patient stuck with butterfly needle for BC x 2. 0500 Labs drawn and sent. Patient in NAD. Denies any needs at this time. Call light in reach. Bed in lowest position. Pending admission to hospital.   6:36 AM  Hospitalist present at bedside.

## 2023-04-16 ENCOUNTER — Ambulatory Visit (HOSPITAL_BASED_OUTPATIENT_CLINIC_OR_DEPARTMENT_OTHER): Payer: Medicare Other | Admitting: General Surgery

## 2023-04-16 ENCOUNTER — Inpatient Hospital Stay (HOSPITAL_COMMUNITY): Payer: Medicare Other

## 2023-04-16 ENCOUNTER — Encounter (HOSPITAL_COMMUNITY): Payer: Self-pay | Admitting: Internal Medicine

## 2023-04-16 DIAGNOSIS — A4181 Sepsis due to Enterococcus: Secondary | ICD-10-CM | POA: Diagnosis not present

## 2023-04-16 DIAGNOSIS — Z01818 Encounter for other preprocedural examination: Secondary | ICD-10-CM | POA: Diagnosis not present

## 2023-04-16 DIAGNOSIS — I4892 Unspecified atrial flutter: Secondary | ICD-10-CM | POA: Diagnosis not present

## 2023-04-16 DIAGNOSIS — I6529 Occlusion and stenosis of unspecified carotid artery: Secondary | ICD-10-CM | POA: Diagnosis not present

## 2023-04-16 DIAGNOSIS — R6520 Severe sepsis without septic shock: Secondary | ICD-10-CM | POA: Diagnosis not present

## 2023-04-16 DIAGNOSIS — I5042 Chronic combined systolic (congestive) and diastolic (congestive) heart failure: Secondary | ICD-10-CM | POA: Diagnosis not present

## 2023-04-16 DIAGNOSIS — I480 Paroxysmal atrial fibrillation: Secondary | ICD-10-CM | POA: Diagnosis not present

## 2023-04-16 DIAGNOSIS — T827XXA Infection and inflammatory reaction due to other cardiac and vascular devices, implants and grafts, initial encounter: Secondary | ICD-10-CM | POA: Diagnosis not present

## 2023-04-16 DIAGNOSIS — I4891 Unspecified atrial fibrillation: Secondary | ICD-10-CM | POA: Diagnosis not present

## 2023-04-16 DIAGNOSIS — I2582 Chronic total occlusion of coronary artery: Secondary | ICD-10-CM | POA: Diagnosis not present

## 2023-04-16 DIAGNOSIS — Z951 Presence of aortocoronary bypass graft: Secondary | ICD-10-CM | POA: Diagnosis not present

## 2023-04-16 DIAGNOSIS — I361 Nonrheumatic tricuspid (valve) insufficiency: Secondary | ICD-10-CM | POA: Diagnosis not present

## 2023-04-16 DIAGNOSIS — Z7901 Long term (current) use of anticoagulants: Secondary | ICD-10-CM | POA: Diagnosis not present

## 2023-04-16 DIAGNOSIS — I483 Typical atrial flutter: Secondary | ICD-10-CM | POA: Diagnosis not present

## 2023-04-16 DIAGNOSIS — I083 Combined rheumatic disorders of mitral, aortic and tricuspid valves: Secondary | ICD-10-CM | POA: Diagnosis not present

## 2023-04-16 DIAGNOSIS — R7881 Bacteremia: Secondary | ICD-10-CM | POA: Diagnosis not present

## 2023-04-16 DIAGNOSIS — B9689 Other specified bacterial agents as the cause of diseases classified elsewhere: Secondary | ICD-10-CM | POA: Diagnosis not present

## 2023-04-16 DIAGNOSIS — N179 Acute kidney failure, unspecified: Secondary | ICD-10-CM | POA: Diagnosis not present

## 2023-04-16 DIAGNOSIS — Z950 Presence of cardiac pacemaker: Secondary | ICD-10-CM | POA: Diagnosis not present

## 2023-04-16 DIAGNOSIS — R5381 Other malaise: Secondary | ICD-10-CM | POA: Diagnosis not present

## 2023-04-16 DIAGNOSIS — B952 Enterococcus as the cause of diseases classified elsewhere: Secondary | ICD-10-CM

## 2023-04-16 DIAGNOSIS — K862 Cyst of pancreas: Secondary | ICD-10-CM | POA: Diagnosis not present

## 2023-04-16 DIAGNOSIS — I25810 Atherosclerosis of coronary artery bypass graft(s) without angina pectoris: Secondary | ICD-10-CM | POA: Diagnosis not present

## 2023-04-16 DIAGNOSIS — M1A9XX0 Chronic gout, unspecified, without tophus (tophi): Secondary | ICD-10-CM | POA: Diagnosis not present

## 2023-04-16 DIAGNOSIS — I110 Hypertensive heart disease with heart failure: Secondary | ICD-10-CM | POA: Diagnosis not present

## 2023-04-16 DIAGNOSIS — Z9989 Dependence on other enabling machines and devices: Secondary | ICD-10-CM | POA: Diagnosis not present

## 2023-04-16 DIAGNOSIS — T829XXA Unspecified complication of cardiac and vascular prosthetic device, implant and graft, initial encounter: Secondary | ICD-10-CM | POA: Diagnosis not present

## 2023-04-16 DIAGNOSIS — K219 Gastro-esophageal reflux disease without esophagitis: Secondary | ICD-10-CM | POA: Diagnosis not present

## 2023-04-16 DIAGNOSIS — I871 Compression of vein: Secondary | ICD-10-CM | POA: Diagnosis not present

## 2023-04-16 DIAGNOSIS — C859A Non-Hodgkin lymphoma, unspecified, in remission: Secondary | ICD-10-CM | POA: Diagnosis not present

## 2023-04-16 DIAGNOSIS — K5730 Diverticulosis of large intestine without perforation or abscess without bleeding: Secondary | ICD-10-CM | POA: Diagnosis not present

## 2023-04-16 DIAGNOSIS — C851A Unspecified b-cell lymphoma, in remission: Secondary | ICD-10-CM | POA: Diagnosis not present

## 2023-04-16 DIAGNOSIS — Z95810 Presence of automatic (implantable) cardiac defibrillator: Secondary | ICD-10-CM | POA: Diagnosis not present

## 2023-04-16 DIAGNOSIS — I340 Nonrheumatic mitral (valve) insufficiency: Secondary | ICD-10-CM | POA: Diagnosis not present

## 2023-04-16 DIAGNOSIS — I442 Atrioventricular block, complete: Secondary | ICD-10-CM | POA: Diagnosis not present

## 2023-04-16 DIAGNOSIS — E785 Hyperlipidemia, unspecified: Secondary | ICD-10-CM | POA: Diagnosis not present

## 2023-04-16 DIAGNOSIS — I2510 Atherosclerotic heart disease of native coronary artery without angina pectoris: Secondary | ICD-10-CM | POA: Diagnosis not present

## 2023-04-16 DIAGNOSIS — E1165 Type 2 diabetes mellitus with hyperglycemia: Secondary | ICD-10-CM | POA: Diagnosis not present

## 2023-04-16 DIAGNOSIS — G4733 Obstructive sleep apnea (adult) (pediatric): Secondary | ICD-10-CM | POA: Diagnosis not present

## 2023-04-16 DIAGNOSIS — E1140 Type 2 diabetes mellitus with diabetic neuropathy, unspecified: Secondary | ICD-10-CM | POA: Diagnosis not present

## 2023-04-16 DIAGNOSIS — Z952 Presence of prosthetic heart valve: Secondary | ICD-10-CM | POA: Diagnosis not present

## 2023-04-16 DIAGNOSIS — I50814 Right heart failure due to left heart failure: Secondary | ICD-10-CM | POA: Diagnosis not present

## 2023-04-16 DIAGNOSIS — I38 Endocarditis, valve unspecified: Secondary | ICD-10-CM | POA: Diagnosis not present

## 2023-04-16 DIAGNOSIS — I4821 Permanent atrial fibrillation: Secondary | ICD-10-CM | POA: Diagnosis not present

## 2023-04-16 DIAGNOSIS — E119 Type 2 diabetes mellitus without complications: Secondary | ICD-10-CM | POA: Diagnosis not present

## 2023-04-16 LAB — ECHOCARDIOGRAM COMPLETE
AR max vel: 3.31 cm2
AV Area VTI: 3.2 cm2
AV Area mean vel: 3.14 cm2
AV Mean grad: 9.5 mm[Hg]
AV Peak grad: 16.1 mm[Hg]
Ao pk vel: 2.01 m/s
Area-P 1/2: 4.12 cm2
MV M vel: 4.91 m/s
MV Peak grad: 96.4 mm[Hg]
MV VTI: 3.48 cm2
S' Lateral: 5.2 cm

## 2023-04-16 LAB — CBC
HCT: 38.8 % — ABNORMAL LOW (ref 39.0–52.0)
Hemoglobin: 12.6 g/dL — ABNORMAL LOW (ref 13.0–17.0)
MCH: 29.4 pg (ref 26.0–34.0)
MCHC: 32.5 g/dL (ref 30.0–36.0)
MCV: 90.4 fL (ref 80.0–100.0)
Platelets: 168 10*3/uL (ref 150–400)
RBC: 4.29 MIL/uL (ref 4.22–5.81)
RDW: 16.6 % — ABNORMAL HIGH (ref 11.5–15.5)
WBC: 6.4 10*3/uL (ref 4.0–10.5)
nRBC: 0 % (ref 0.0–0.2)

## 2023-04-16 LAB — BASIC METABOLIC PANEL
Anion gap: 12 (ref 5–15)
BUN: 28 mg/dL — ABNORMAL HIGH (ref 8–23)
CO2: 21 mmol/L — ABNORMAL LOW (ref 22–32)
Calcium: 8.2 mg/dL — ABNORMAL LOW (ref 8.9–10.3)
Chloride: 102 mmol/L (ref 98–111)
Creatinine, Ser: 1.49 mg/dL — ABNORMAL HIGH (ref 0.61–1.24)
GFR, Estimated: 46 mL/min — ABNORMAL LOW (ref >60)
Glucose, Bld: 120 mg/dL — ABNORMAL HIGH (ref 70–99)
Potassium: 3.4 mmol/L — ABNORMAL LOW (ref 3.5–5.1)
Sodium: 135 mmol/L (ref 135–145)

## 2023-04-16 LAB — CBG MONITORING, ED: Glucose-Capillary: 116 mg/dL — ABNORMAL HIGH (ref 70–99)

## 2023-04-16 LAB — GLUCOSE, CAPILLARY
Glucose-Capillary: 144 mg/dL — ABNORMAL HIGH (ref 70–99)
Glucose-Capillary: 82 mg/dL (ref 70–99)

## 2023-04-16 MED ORDER — SODIUM CHLORIDE 0.9 % IV SOLN: 2.0000 g | Freq: Two times a day (BID) | INTRAVENOUS | Status: DC

## 2023-04-16 MED ORDER — PERFLUTREN LIPID MICROSPHERE
1.0000 mL | INTRAVENOUS | Status: AC | PRN
  Administered 2023-04-16: 2 mL via INTRAVENOUS

## 2023-04-16 MED ORDER — SODIUM CHLORIDE 0.9 % IV SOLN: 2.0000 g | Freq: Four times a day (QID) | INTRAVENOUS | Status: DC

## 2023-04-16 MED ORDER — LORATADINE 10 MG PO TABS
10.0000 mg | ORAL_TABLET | Freq: Every day | ORAL | Status: DC | PRN
  Administered 2023-04-16: 10 mg via ORAL
  Filled 2023-04-16: qty 1

## 2023-04-16 NOTE — Consult Note (Addendum)
[facility]    Date of Admission:  04/14/2023   Total days of inpatient antibiotics 1        Reason for Consult: bacteremia    Active Problems:   Sepsis [facility])   Assessment: 82 year old male admitted with #E faecalis bacteremia #Recent  pacemaker placement at [facility] on 10/1 -I think given that there is a new pacemaker placement certainly be a source of bacteremia.  On exam, pacemaker insertion site did not have erythema.  Patient also has pretty significant scaly rash on his arms and legs without signs of cellulitis at this point.  But given broken skin report of entry as well.  No urinary symptoms reported.  Patient does report he felt like he had the flu prior to admission.  Denied any respiratory symptoms. Recommendations:  -Repeat blood cultures - TTE - Please engage EP given a faecalis bacteremia and recent PPM placement I suspect [facility] may [facility]. - Continue ceftriaxone and ampicillin for empiric endocarditis coverage  # Large cell non-Hodgkin's lymphoma treated with chemotherapy with R-CHOP - Followed by oncology Dr. Myna Hidalgo - CT chest abdomen pelvis on admission showed new enlarged left paratracheal lymph node and right inguinal lymph node, may be related to patient's history of lymphoma.  Trabeculated bladder wall near dome of bladder.  Patient has no UTI symptoms. - Possible inguinal lymph node biopsy in the  future Microbiology:   Antibiotics: Ampicillin and ceftriaxone 10/15-present Cefepime vancomycin 10/14  Cultures: Blood 10/14 2/2E faecalis Urine  Other   HPI: Richard [name] is a 82 y.o. male with past medical history of skin keratosis, diabetes mellitus, CAD, diffuse large B-cell lymphoma, pacemaker with recent reimplantation on 10/1 [facility] present to the ED with fever of 102.  Blood cultures grew E faecalis admitted for further  workup.   Review of Systems: Review of Systems  All other systems reviewed and are negative.   Past Medical History:  Diagnosis Date   Anemia    Arthritis    hips   Axillary adenopathy 02/25/2017   Bradycardia    a. holter monitor has demonstrated HRs in 30s and [place]ink[place]ach    CAD (coronary artery disease)    a. s/p CABG 2001  b.  07/28/2017 cath:   Severe three-vessel native CAD with total occlusion of LAD, ramus intermedius, first OM and RCA, patent RIMA to PDA, LIMA to LAD, sequential SVG to ramus intermedius and first OM.     Chronic lower back pain    Diffuse large B cell lymphoma (HCC)    Diverticulosis    Esophageal stricture    GERD (gastroesophageal reflux disease)    History of gout    HTN (hypertension)    Mixed hyperlipidemia    OSA on CPAP    with 2L O2 at night   Pancytopenia (HCC)    a. related to chemo therapy for B cell lymphoma   Peptic stricture of esophagus    Presence of permanent cardiac pacemaker    sees Dr. Clayborn Bigness pacemaker   SCC (squamous cell carcinoma) 01/31/2021   R zygoma, [facility]   SCC (squamous cell carcinoma) 01/31/2021   L post ankle, [facility]   SCC (squamous cell carcinoma) 01/31/2021   L popliteal, [facility]   Severe aortic stenosis    Spinal stenosis    Squamous cell carcinoma of skin 03/22/2009   Right mandible. SCCis,  hypertrophic.    Squamous cell carcinoma of skin 12/25/2021   R lateral cheek, [facility]   Squamous cell carcinoma of skin 12/25/2021   R postauricular neck, [facility]   Squamous cell carcinoma of skin 12/25/2021   R forearm sup, [facility]   Squamous cell carcinoma of skin 12/25/2021   R wrist, [facility]   Type II diabetes mellitus (HCC)    Wears dentures    partial upper    Social History   Tobacco Use   Smoking status: Never   Smokeless tobacco: Never  Vaping Use   Vaping status: Never Used  Substance Use Topics   Alcohol use: No    Alcohol/week: 0.0 standard drinks of alcohol    Comment: "last drink was in 2012"( 03/15/2014)    Drug use: No    Family History  Problem Relation Age of Onset   Stroke Father    Hypertension Father    Pancreatic cancer Mother    Diabetes Maternal Grandmother    Stroke Maternal Grandmother    Heart attack Paternal Grandmother    Colon cancer Neg Hx    Esophageal cancer Neg Hx    Rectal cancer Neg Hx    Stomach cancer Neg Hx    Ulcers Neg Hx    Scheduled Meds:  allopurinol  450 mg Oral Daily   apixaban  2.5 mg Oral BID   ascorbic acid  500 mg Oral Q breakfast   atorvastatin  80 mg Oral Daily   dapagliflozin propanediol  10 mg Oral Q breakfast   ezetimibe  10 mg Oral Daily   fenofibrate  160 mg Oral Daily   folic acid  2 mg Oral Daily   glimepiride  2 mg Oral Q breakfast   icosapent Ethyl  2 g Oral BID   insulin aspart  0-15 Units Subcutaneous TID WC   insulin aspart  0-5 Units Subcutaneous QHS   metoprolol succinate  25 mg Oral Daily   pantoprazole  80 mg Oral Q1200   potassium chloride SA  40 mEq Oral Daily   pregabalin  100 mg Oral Daily   pregabalin  150 mg Oral QHS   Continuous Infusions:  ampicillin (OMNIPEN) IV 2 g (04/16/23 1314)   cefTRIAXone (ROCEPHIN)  IV 2 g (04/16/23 1003)   PRN Meds:.acetaminophen **OR** acetaminophen, albuterol, aluminum hydroxide-magnesium carbonate, loratadine, nitroGLYCERIN, ondansetron **OR** ondansetron (ZOFRAN) IV, traZODone Allergies  Allergen Reactions   Ace Inhibitors Swelling and Other (See Comments)    Angioedema    Benazepril Swelling and Other (See Comments)    Angioedema; he is not a candidate for any angiotensin receptor blockers because of this significant allergic reaction. Because of a history of documented adverse serious drug reaction;Medi Alert bracelet  is recommended   Entresto [Sacubitril-Valsartan] Swelling and Other (See Comments)    First-in-Class Angiotensin Receptor Neprilysin Inhibitor- Med was "red-flagged" by the patient's pharmacy for him to NOT take!   Hctz [Hydrochlorothiazide] Anaphylaxis and  Swelling    Tongue and lip swelling    Aspirin Other (See Comments)    Gastritis, can aspirin not take 325 mg aspirin    OBJECTIVE: Blood pressure 123/79, pulse 98, temperature 97.6 F (36.4 C), temperature source Oral, resp. rate 16, height 6' (1.829 m), weight 105 kg, SpO2 97%.  Physical Exam Constitutional:      General: He is not in acute distress.    Appearance: He is normal weight. He is not toxic-appearing.  HENT:     Head: Normocephalic and atraumatic.  Right Ear: External ear normal.     Left Ear: External ear normal.     Nose: No congestion or rhinorrhea.     Mouth/Throat:     Mouth: Mucous membranes are moist.     Pharynx: Oropharynx is clear.  Eyes:     Extraocular Movements: Extraocular movements intact.     Conjunctiva/sclera: Conjunctivae normal.     Pupils: Pupils are equal, round, and reactive to light.  Cardiovascular:     Rate and Rhythm: Normal rate and regular rhythm.     Heart sounds: No murmur heard.    No friction rub. No gallop.  Pulmonary:     Effort: Pulmonary effort is normal.     Breath sounds: Normal breath sounds.  Abdominal:     General: Abdomen is flat. Bowel sounds are normal.     Palpations: Abdomen is soft.  Musculoskeletal:        General: No swelling. Normal range of motion.     Cervical back: Normal range of motion and neck supple.  Skin:    General: Skin is warm and dry.     Comments: Scaly rash on arms and legs  Neurological:     General: No focal deficit present.     Mental Status: He is oriented to person, place, and time.  Psychiatric:        Mood and Affect: Mood normal.     Lab Results Lab Results  Component Value Date   WBC 6.4 04/16/2023   HGB 12.6 (L) 04/16/2023   HCT 38.8 (L) 04/16/2023   MCV 90.4 04/16/2023   PLT 168 04/16/2023    Lab Results  Component Value Date   CREATININE 1.49 (H) 04/16/2023   BUN 28 (H) 04/16/2023   NA 135 04/16/2023   K 3.4 (L) 04/16/2023   CL 102 04/16/2023   CO2 21 (L)  04/16/2023    Lab Results  Component Value Date   ALT 36 04/14/2023   AST 40 04/14/2023   ALKPHOS 60 04/14/2023   BILITOT 0.8 04/14/2023       Danelle Earthly, MD [facility] 04/16/2023, 2:05 PM I have personally spent 84 minutes involved in face-to-face and non-face-to-face activities for this patient on the day of the visit. Professional time spent includes the following activities: Preparing to see the patient (review of tests), Obtaining and/or reviewing separately obtained history (admission/discharge record), Performing a medically appropriate examination and/or evaluation , Ordering medications/tests/procedures, referring and communicating with other health care professionals, Documenting clinical information in the EMR, Independently interpreting results (not separately reported), Communicating results to the patient/family/caregiver, Counseling and educating the patient/family/caregiver and Care coordination (not separately reported).

## 2023-04-16 NOTE — Progress Notes (Signed)
Report given to [facility] @ [address] floor [facility].

## 2023-04-16 NOTE — Progress Notes (Signed)
Mobility Specialist - Progress Note   04/16/23 1340  Mobility  Activity Ambulated independently in hallway  Level of Assistance Independent  Assistive Device None  Distance Ambulated (ft) 440 ft  Activity Response Tolerated well  Mobility Referral Yes  $Mobility charge 1 Mobility  Mobility Specialist Start Time (ACUTE ONLY) 0133  Mobility Specialist Stop Time (ACUTE ONLY) 0140  Mobility Specialist Time Calculation (min) (ACUTE ONLY) 7 min   Pt received in recliner and agreeable to mobility. No complaints during session. Pt to recliner after session with all needs met.    [facility]

## 2023-04-16 NOTE — ED Notes (Signed)
ED TO INPATIENT HANDOFF REPORT  Name/Age/Gender Richard [name] 82 y.o. male  Code Status    Code Status Orders  (From admission, onward)           Start     Ordered   04/15/23 1726  Full code  Continuous       Question:  By:  Answer:  Consent: discussion documented in EHR   04/15/23 1727           Code Status History     Date Active Date Inactive Code Status Order ID Comments User Context   04/25/2022 0751 04/27/2022 1645 Full Code [id number]  Teddy Spike, DO ED   01/16/2022 1618 01/23/2022 1901 Full Code [id number]  Swayze, Ava, DO ED   09/09/2017 0939 09/12/2017 1820 Full Code [id number]  Kathleene Hazel, MD Inpatient   08/16/2017 2357 08/21/2017 1841 Full Code [id number]  Salary, Evelena Asa, MD ED   07/24/2017 0036 07/29/2017 1758 Full Code [id number]  Esmond Plants, MD ED   03/16/2014 1741 03/17/2014 1626 Full Code [id number]  Kathleene Hazel, MD Inpatient   03/15/2014 1955 03/16/2014 1741 Full Code [id number]  Allayne Butcher, PA-C Inpatient   06/17/2013 1649 06/21/2013 1700 Full Code [id number]  Jacki Cones, MD Inpatient      Advance Directive Documentation    Flowsheet Row Most Recent Value  Type of Advance Directive Healthcare Power of Attorney, Living will  Pre-existing out of facility DNR order (yellow form or pink MOST form) --  "MOST" Form in Place? --       Home/SNF/Other Home  Chief Complaint Sepsis (HCC) [A41.9]  Level of Care/Admitting Diagnosis ED Disposition     ED Disposition  Admit   Condition  --   Comment  Hospital Area: [facility] [[id number]]  Level of Care: Progressive [102]  Admit to Progressive based on following criteria: MULTISYSTEM THREATS such as stable sepsis, metabolic/electrolyte imbalance with or without encephalopathy that is responding to early treatment.  May admit patient to [facility] or [facility] if equivalent level of care is available:: Yes  Covid Evaluation: Asymptomatic  - no recent exposure (last 10 days) testing not required  Diagnosis: Sepsis [facility]) [[id number]]  Admitting Physician: Maryln Gottron [[id number]]  Attending Physician: Kirby Crigler, MIR [email or web address] [[id number]]  Certification:: I certify this patient will need inpatient services for at least 2 midnights  Expected Medical Readiness: 04/18/2023          Medical History Past Medical History:  Diagnosis Date   Anemia    Arthritis    hips   Axillary adenopathy 02/25/2017   Bradycardia    a. holter monitor has demonstrated HRs in 30s and Weinkibach    CAD (coronary artery disease)    a. s/p CABG 2001  b.  07/28/2017 cath:   Severe three-vessel native CAD with total occlusion of LAD, ramus intermedius, first OM and RCA, patent RIMA to PDA, LIMA to LAD, sequential SVG to ramus intermedius and first OM.     Chronic lower back pain    Diffuse large B cell lymphoma (HCC)    Diverticulosis    Esophageal stricture    GERD (gastroesophageal reflux disease)    History of gout    HTN (hypertension)    Mixed hyperlipidemia    OSA on CPAP    with 2L O2 at night   Pancytopenia (HCC)    a. related to chemo therapy for B cell lymphoma   Peptic stricture of esophagus  Presence of permanent cardiac pacemaker    sees Dr. Clayborn Bigness pacemaker   SCC (squamous cell carcinoma) 01/31/2021   R zygoma, EDC   SCC (squamous cell carcinoma) 01/31/2021   L post ankle, EDC   SCC (squamous cell carcinoma) 01/31/2021   L popliteal, EDC   Severe aortic stenosis    Spinal stenosis    Squamous cell carcinoma of skin 03/22/2009   Right mandible. SCCis, hypertrophic.    Squamous cell carcinoma of skin 12/25/2021   R lateral cheek, EDC   Squamous cell carcinoma of skin 12/25/2021   R postauricular neck, EDC   Squamous cell carcinoma of skin 12/25/2021   R forearm sup, EDC   Squamous cell carcinoma of skin 12/25/2021   R wrist, EDC   Type II diabetes mellitus (HCC)    Wears dentures    partial upper     Allergies Allergies  Allergen Reactions   Ace Inhibitors Swelling and Other (See Comments)    Angioedema    Benazepril Swelling and Other (See Comments)    Angioedema; he is not a candidate for any angiotensin receptor blockers because of this significant allergic reaction. Because of a history of documented adverse serious drug reaction;Medi Alert bracelet  is recommended   Entresto [Sacubitril-Valsartan] Swelling and Other (See Comments)    First-in-Class Angiotensin Receptor Neprilysin Inhibitor- Med was "red-flagged" by the patient's pharmacy for him to NOT take!   Hctz [Hydrochlorothiazide] Anaphylaxis and Swelling    Tongue and lip swelling    Aspirin Other (See Comments)    Gastritis, can aspirin not take 325 mg aspirin    IV Location/Drains/Wounds Patient Lines/Drains/Airways Status     Active Line/Drains/Airways     Name Placement date Placement time Site Days   Peripheral IV 04/14/23 20 G Right Antecubital 04/14/23  1914  Antecubital  2   Peripheral IV 04/14/23 20 G Anterior;Left Hand 04/14/23  1915  Hand  2   Wound / Incision (Open or Dehisced) 04/25/22 Skin tear Knee Anterior;Left;Medial Large skin tear, skin flap intact, bloody, oozing 04/25/22  1411  Knee  356            Labs/Imaging Results for orders placed or performed during the hospital encounter of 04/14/23 (from the past 48 hour(s))  Culture, blood (Routine x 2)     Status: None (Preliminary result)   Collection Time: 04/14/23  7:11 PM   Specimen: BLOOD  Result Value Ref Range   Specimen Description      BLOOD RIGHT ANTECUBITAL Performed at [facility], [address] [place], [place] [address]    Special Requests      BOTTLES DRAWN AEROBIC AND ANAEROBIC Blood Culture adequate volume Performed at [facility], [address], [place], [place] [address]    Culture  Setup Time      GRAM POSITIVE COCCI IN PAIRS AND CHAINS IN BOTH AEROBIC AND ANAEROBIC  BOTTLES CRITICAL VALUE NOTED.  VALUE IS CONSISTENT WITH PREVIOUSLY REPORTED AND CALLED VALUE. Performed at [facility], [address], [place], [place] [address]    Culture GRAM POSITIVE COCCI    Report Status PENDING   Comprehensive metabolic panel     Status: Abnormal   Collection Time: 04/14/23  7:15 PM  Result Value Ref Range   Sodium 134 (L) 135 - 145 mmol/L   Potassium 3.5 3.5 - 5.1 mmol/L   Chloride 100 98 - 111 mmol/L   CO2 23 22 - 32 mmol/L  Glucose, Bld 152 (H) 70 - 99 mg/dL    Comment: Glucose reference range applies only to samples taken after fasting for at least 8 hours.   BUN 22 8 - 23 mg/dL   Creatinine, Ser 1.61 (H) 0.61 - 1.24 mg/dL   Calcium 8.3 (L) 8.9 - 10.3 mg/dL   Total Protein 7.0 6.5 - 8.1 g/dL   Albumin 3.7 3.5 - 5.0 g/dL   AST 40 15 - 41 U/L   ALT 36 0 - 44 U/L   Alkaline Phosphatase 60 38 - 126 U/L   Total Bilirubin 0.8 0.3 - 1.2 mg/dL   GFR, Estimated 44 (L) >60 mL/min    Comment: (NOTE) Calculated using the CKD-EPI Creatinine Equation (2021)    Anion gap 11 5 - 15    Comment: Performed at [facility], [address], [place], [place] [address]  CBC with Differential     Status: Abnormal   Collection Time: 04/14/23  7:15 PM  Result Value Ref Range   WBC 6.6 4.0 - 10.5 K/uL   RBC 4.46 4.22 - 5.81 MIL/uL   Hemoglobin 13.1 13.0 - 17.0 g/dL   HCT 54.0 98.1 - 19.1 %   MCV 90.4 80.0 - 100.0 fL   MCH 29.4 26.0 - 34.0 pg   MCHC 32.5 30.0 - 36.0 g/dL   RDW 47.8 (H) 29.5 - 62.1 %   Platelets 184 150 - 400 K/uL   nRBC 0.0 0.0 - 0.2 %   Neutrophils Relative % 78 %   Neutro Abs 5.1 1.7 - 7.7 K/uL   Lymphocytes Relative 14 %   Lymphs Abs 0.9 0.7 - 4.0 K/uL   Monocytes Relative 7 %   Monocytes Absolute 0.4 0.1 - 1.0 K/uL   Eosinophils Relative 0 %   Eosinophils Absolute 0.0 0.0 - 0.5 K/uL   Basophils Relative 0 %   Basophils Absolute 0.0 0.0 - 0.1 K/uL   Immature Granulocytes 1 %   Abs Immature Granulocytes 0.08 (H) 0.00  - 0.07 K/uL    Comment: Performed at [facility], [address] [place], [place] [address]  Protime-INR     Status: Abnormal   Collection Time: 04/14/23  7:15 PM  Result Value Ref Range   Prothrombin Time 15.7 (H) 11.4 - 15.2 seconds   INR 1.2 0.8 - 1.2    Comment: (NOTE) INR goal varies based on device and disease states. Performed at [facility], [address], [place], [place] [address]   I-Stat Lactic Acid, ED     Status: None   Collection Time: 04/14/23  7:29 PM  Result Value Ref Range   Lactic Acid, Venous 1.7 0.5 - 1.9 mmol/L  Culture, blood (Routine x 2)     Status: None (Preliminary result)   Collection Time: 04/14/23  7:29 PM   Specimen: BLOOD  Result Value Ref Range   Specimen Description      BLOOD LEFT ANTECUBITAL Performed at [facility], [address], [place], [place] [address]    Special Requests      BOTTLES DRAWN AEROBIC AND ANAEROBIC Blood Culture results may not be optimal due to an excessive volume of blood received in culture bottles Performed at [facility], [address], [place], [place] [address]    Culture  Setup Time      GRAM POSITIVE COCCI IN PAIRS AND CHAINS IN BOTH AEROBIC AND ANAEROBIC BOTTLES CRITICAL RESULT CALLED TO, READ BACK BY  AND VERIFIED WITH: PHARMD N.GOOGOVAC AT 1455 ON 04/15/2023 BY T.SAAD. Performed at [facility], [address], [place], [place] [address]    Culture GRAM POSITIVE COCCI    Report Status PENDING   Blood Culture ID Panel (Reflexed)     Status: Abnormal   Collection Time: 04/14/23  7:29 PM  Result Value Ref Range   Enterococcus faecalis DETECTED (A) NOT DETECTED    Comment: CRITICAL RESULT CALLED TO, READ BACK BY AND VERIFIED WITH: PHARMD N.GOOGOVAC AT 1455 ON 04/15/2023 BY T.SAAD.    Enterococcus Faecium NOT DETECTED NOT DETECTED   Listeria monocytogenes NOT DETECTED NOT DETECTED   Staphylococcus species NOT DETECTED NOT  DETECTED   Staphylococcus aureus (BCID) NOT DETECTED NOT DETECTED   Staphylococcus epidermidis NOT DETECTED NOT DETECTED   Staphylococcus lugdunensis NOT DETECTED NOT DETECTED   Streptococcus species NOT DETECTED NOT DETECTED   Streptococcus agalactiae NOT DETECTED NOT DETECTED   Streptococcus pneumoniae NOT DETECTED NOT DETECTED   Streptococcus pyogenes NOT DETECTED NOT DETECTED   A.calcoaceticus-baumannii NOT DETECTED NOT DETECTED   Bacteroides fragilis NOT DETECTED NOT DETECTED   Enterobacterales NOT DETECTED NOT DETECTED   Enterobacter cloacae complex NOT DETECTED NOT DETECTED   Escherichia coli NOT DETECTED NOT DETECTED   Klebsiella aerogenes NOT DETECTED NOT DETECTED   Klebsiella oxytoca NOT DETECTED NOT DETECTED   Klebsiella pneumoniae NOT DETECTED NOT DETECTED   Proteus species NOT DETECTED NOT DETECTED   Salmonella species NOT DETECTED NOT DETECTED   Serratia marcescens NOT DETECTED NOT DETECTED   Haemophilus influenzae NOT DETECTED NOT DETECTED   Neisseria meningitidis NOT DETECTED NOT DETECTED   Pseudomonas aeruginosa NOT DETECTED NOT DETECTED   Stenotrophomonas maltophilia NOT DETECTED NOT DETECTED   Candida albicans NOT DETECTED NOT DETECTED   Candida auris NOT DETECTED NOT DETECTED   Candida glabrata NOT DETECTED NOT DETECTED   Candida krusei NOT DETECTED NOT DETECTED   Candida parapsilosis NOT DETECTED NOT DETECTED   Candida tropicalis NOT DETECTED NOT DETECTED   Cryptococcus neoformans/gattii NOT DETECTED NOT DETECTED   Vancomycin resistance NOT DETECTED NOT DETECTED    Comment: Performed at [facility], [address], [place], [place] [address]  Urinalysis, w/ Reflex to Culture (Infection Suspected) -Urine, Clean Catch     Status: Abnormal   Collection Time: 04/14/23  7:54 PM  Result Value Ref Range   Specimen Source URINE, CATHETERIZED    Color, Urine STRAW (A) YELLOW   APPearance CLEAR CLEAR   Specific Gravity, Urine 1.009 1.005 - 1.030   pH 6.0 5.0 -  8.0   Glucose, UA >=500 (A) NEGATIVE mg/dL   Hgb urine dipstick NEGATIVE NEGATIVE   Bilirubin Urine NEGATIVE NEGATIVE   Ketones, ur NEGATIVE NEGATIVE mg/dL   Protein, ur NEGATIVE NEGATIVE mg/dL   Nitrite NEGATIVE NEGATIVE   Leukocytes,Ua NEGATIVE NEGATIVE   RBC / HPF 0-5 0 - 5 RBC/hpf   WBC, UA 0-5 0 - 5 WBC/hpf    Comment:        Reflex urine culture not performed if WBC <=10, OR if Squamous epithelial cells >5. If Squamous epithelial cells >5 suggest recollection.    Bacteria, UA NONE SEEN NONE SEEN   Squamous Epithelial / HPF 0-5 0 - 5 /HPF    Comment: Performed at [facility], [address], [place], [place] [address]  Resp panel by RT-PCR (RSV, Flu A&B, Covid) Anterior Nasal Swab     Status: None   Collection Time: 04/14/23  8:03  PM   Specimen: Anterior Nasal Swab  Result Value Ref Range   SARS Coronavirus 2 by RT PCR NEGATIVE NEGATIVE    Comment: (NOTE) SARS-CoV-2 target nucleic acids are NOT DETECTED.  The SARS-CoV-2 RNA is generally detectable in upper respiratory specimens during the acute phase of infection. The lowest concentration of SARS-CoV-2 viral copies this assay can detect is 138 copies/mL. A negative result does not preclude SARS-Cov-2 infection and should not be used as the sole basis for treatment or other patient management decisions. A negative result may occur with  improper specimen collection/handling, submission of specimen other than nasopharyngeal swab, presence of viral mutation(s) within the areas targeted by this assay, and inadequate number of viral copies(<138 copies/mL). A negative result must be combined with clinical observations, patient history, and epidemiological information. The expected result is Negative.  Fact Sheet for Patients:  BloggerCourse.com  Fact Sheet for Healthcare Providers:  SeriousBroker.it  This test is no t yet approved or cleared by the  [place] FDA and  has been authorized for detection and/or diagnosis of SARS-CoV-2 by FDA under an Emergency Use Authorization (EUA). This EUA will remain  in effect (meaning this test can be used) for the duration of the COVID-19 declaration under Section 564(b)(1) of the Act, 21 U.S.C.section 360bbb-3(b)(1), unless the authorization is terminated  or revoked sooner.       Influenza A by PCR NEGATIVE NEGATIVE   Influenza B by PCR NEGATIVE NEGATIVE    Comment: (NOTE) The Xpert Xpress SARS-CoV-2/FLU/RSV plus assay is intended as an aid in the diagnosis of influenza from Nasopharyngeal swab specimens and should not be used as a sole basis for treatment. Nasal washings and aspirates are unacceptable for Xpert Xpress SARS-CoV-2/FLU/RSV testing.  Fact Sheet for Patients: BloggerCourse.com  Fact Sheet for Healthcare Providers: SeriousBroker.it  This test is not yet approved or cleared by the [place] FDA and has been authorized for detection and/or diagnosis of SARS-CoV-2 by FDA under an Emergency Use Authorization (EUA). This EUA will remain in effect (meaning this test can be used) for the duration of the COVID-19 declaration under Section 564(b)(1) of the Act, 21 U.S.C. section 360bbb-3(b)(1), unless the authorization is terminated or revoked.     Resp Syncytial Virus by PCR NEGATIVE NEGATIVE    Comment: (NOTE) Fact Sheet for Patients: BloggerCourse.com  Fact Sheet for Healthcare Providers: SeriousBroker.it  This test is not yet approved or cleared by the [place] FDA and has been authorized for detection and/or diagnosis of SARS-CoV-2 by FDA under an Emergency Use Authorization (EUA). This EUA will remain in effect (meaning this test can be used) for the duration of the COVID-19 declaration under Section 564(b)(1) of the Act, 21 U.S.C. section 360bbb-3(b)(1),  unless the authorization is terminated or revoked.  Performed at [facility], [address], [place], [place] [address]   Respiratory (~20 pathogens) panel by PCR     Status: None   Collection Time: 04/14/23  8:41 PM   Specimen: Nasopharyngeal Swab; Respiratory  Result Value Ref Range   Adenovirus NOT DETECTED NOT DETECTED   Coronavirus 229E NOT DETECTED NOT DETECTED    Comment: (NOTE) The Coronavirus on the Respiratory Panel, DOES NOT test for the novel  Coronavirus (2019 nCoV)    Coronavirus HKU1 NOT DETECTED NOT DETECTED   Coronavirus NL63 NOT DETECTED NOT DETECTED   Coronavirus OC43 NOT DETECTED NOT DETECTED   Metapneumovirus NOT DETECTED NOT DETECTED   Rhinovirus / Enterovirus NOT DETECTED NOT DETECTED  Influenza A NOT DETECTED NOT DETECTED   Influenza B NOT DETECTED NOT DETECTED   Parainfluenza Virus 1 NOT DETECTED NOT DETECTED   Parainfluenza Virus 2 NOT DETECTED NOT DETECTED   Parainfluenza Virus 3 NOT DETECTED NOT DETECTED   Parainfluenza Virus 4 NOT DETECTED NOT DETECTED   Respiratory Syncytial Virus NOT DETECTED NOT DETECTED   Bordetella pertussis NOT DETECTED NOT DETECTED   Bordetella Parapertussis NOT DETECTED NOT DETECTED   Chlamydophila pneumoniae NOT DETECTED NOT DETECTED   Mycoplasma pneumoniae NOT DETECTED NOT DETECTED    Comment: Performed at [facility], [address], [place], [place] [address]  Group A Strep by PCR     Status: None   Collection Time: 04/14/23  9:06 PM   Specimen: Throat; Sterile Swab  Result Value Ref Range   Group A Strep by PCR NOT DETECTED NOT DETECTED    Comment: Performed at [facility], [address], [place], [place] [address]  CBG monitoring, ED     Status: Abnormal   Collection Time: 04/14/23 11:22 PM  Result Value Ref Range   Glucose-Capillary 101 (H) 70 - 99 mg/dL    Comment: Glucose reference range applies only to samples taken after fasting for at least 8 hours.   Creatinine, serum     Status: Abnormal   Collection Time: 04/15/23  7:30 AM  Result Value Ref Range   Creatinine, Ser 1.44 (H) 0.61 - 1.24 mg/dL   GFR, Estimated 48 (L) >60 mL/min    Comment: (NOTE) Calculated using the CKD-EPI Creatinine Equation (2021) Performed at [facility], [address], [place], [place] [address]   CBG monitoring, ED     Status: Abnormal   Collection Time: 04/15/23  9:42 PM  Result Value Ref Range   Glucose-Capillary 149 (H) 70 - 99 mg/dL    Comment: Glucose reference range applies only to samples taken after fasting for at least 8 hours.  Basic metabolic panel     Status: Abnormal   Collection Time: 04/16/23  4:39 AM  Result Value Ref Range   Sodium 135 135 - 145 mmol/L   Potassium 3.4 (L) 3.5 - 5.1 mmol/L   Chloride 102 98 - 111 mmol/L   CO2 21 (L) 22 - 32 mmol/L   Glucose, Bld 120 (H) 70 - 99 mg/dL    Comment: Glucose reference range applies only to samples taken after fasting for at least 8 hours.   BUN 28 (H) 8 - 23 mg/dL   Creatinine, Ser 6.38 (H) 0.61 - 1.24 mg/dL   Calcium 8.2 (L) 8.9 - 10.3 mg/dL   GFR, Estimated 46 (L) >60 mL/min    Comment: (NOTE) Calculated using the CKD-EPI Creatinine Equation (2021)    Anion gap 12 5 - 15    Comment: Performed at [facility], [address] [place], [place] [address]  CBC     Status: Abnormal   Collection Time: 04/16/23  4:39 AM  Result Value Ref Range   WBC 6.4 4.0 - 10.5 K/uL   RBC 4.29 4.22 - 5.81 MIL/uL   Hemoglobin 12.6 (L) 13.0 - 17.0 g/dL   HCT 32.9 (L) 51.8 - 84.1 %   MCV 90.4 80.0 - 100.0 fL   MCH 29.4 26.0 - 34.0 pg   MCHC 32.5 30.0 - 36.0 g/dL   RDW 66.0 (H) 63.0 - 16.0 %   Platelets 168 150 - 400 K/uL   nRBC 0.0 0.0 - 0.2 %  Comment: Performed at [facility], [address] [place], [place] [address]   *Note: Due to a large number of results and/or encounters for the requested time period, some results have not been  displayed. A complete set of results can be found in Results Review.   CT CHEST ABDOMEN PELVIS W CONTRAST  Result Date: 04/15/2023 CLINICAL DATA:  Sepsis.  History of lymphoma. EXAM: CT CHEST, ABDOMEN, AND PELVIS WITH CONTRAST TECHNIQUE: Multidetector CT imaging of the chest, abdomen and pelvis was performed following the standard protocol during bolus administration of intravenous contrast. RADIATION DOSE REDUCTION: This exam was performed according to the departmental dose-optimization program which includes automated exposure control, adjustment of the mA and/or kV according to patient size and/or use of iterative reconstruction technique. CONTRAST:  80mL OMNIPAQUE IOHEXOL 300 MG/ML  SOLN COMPARISON:  Chest CT 10/24/2020.  PET-CT 02/24/2018. FINDINGS: CT CHEST FINDINGS Cardiovascular: Heart is enlarged. Pacemaker and aortic valve are again noted. Aorta is normal in size. There are atherosclerotic calcifications of the aorta and coronary arteries. There is no pericardial effusion. Mediastinum/Nodes: There is an enlarged high left paratracheal lymph node measuring 14 mm short axis image 2/12 which has increased in size. There are no other enlarged mediastinal, hilar or axillary lymph nodes present. The visualized esophagus and thyroid gland are within normal limits. Lungs/Pleura: There is minimal atelectasis in the lung bases. There some minimal peripheral reticular opacities in both lung bases as well. There is no focal lung infiltrate, pleural effusion or pneumothorax. There is scarring in both lung apices. Musculoskeletal: Sternotomy wires are present. No acute fracture or focal osseous lesion identified. Degenerative changes affect the spine. CT ABDOMEN PELVIS FINDINGS Hepatobiliary: No focal liver abnormality is seen. No gallstones, gallbladder wall thickening, or biliary dilatation. Pancreas: Rounded low-attenuation/cystic areas measuring up to 11 mm in the pancreatic tail appear more prominent when  compared to the prior study. No pancreatic ductal dilatation or surrounding inflammatory changes. Spleen: Normal in size without focal abnormality. Adrenals/Urinary Tract: Trabeculated bladder wall is seen near the dome of the bladder. No bladder calculi are seen. The bladder is distended. The kidneys and adrenal glands are within normal limits. Stomach/Bowel: Stomach is within normal limits. No evidence of bowel wall thickening, distention, or inflammatory changes. The appendix is not seen. There is sigmoid colon diverticulosis. Vascular/Lymphatic: Aortic atherosclerosis. Aorta and IVC are normal in size. There is an enlarged right inguinal lymph node measuring 1.0 x 2.3 cm which is new from prior. Reproductive: Prostate gland is enlarged. Other: No abdominal wall hernia or abnormality. No abdominopelvic ascites. Musculoskeletal: No acute or significant osseous findings. IMPRESSION: 1. New enlarged left paratracheal lymph node and right inguinal lymph node. Findings may be related to patient's history of lymphoma. 2. Cardiomegaly. 3. Minimal peripheral reticular opacities in the lung bases may represent mild interstitial lung disease. 4. Trabeculated bladder wall near the dome of the bladder. Correlate clinically for cystitis. 5. Prostate gland is enlarged. 6. Colonic diverticulosis without evidence for diverticulitis. 7. Cystic areas in the pancreatic tail appear more prominent when compared to the prior. This can be further evaluated with MRI. Aortic Atherosclerosis (ICD10-I70.0). Electronically Signed   By: Darliss Cheney M.D.   On: 04/15/2023 01:21   DG Chest Port 1 View  Result Date: 04/14/2023 CLINICAL DATA:  Sepsis EXAM: PORTABLE CHEST 1 VIEW COMPARISON:  06/03/2022 FINDINGS: Right-sided pacing device with leads projecting over right atrium and right ventricle. Post sternotomy changes and valve prosthesis. Previously noted lead this pacemaker  no longer visualized. No focal airspace disease or effusion.  Stable cardiomediastinal silhouette with aortic atherosclerosis. IMPRESSION: No active disease. Electronically Signed   By: Jasmine Pang M.D.   On: 04/14/2023 22:06    Pending Labs Unresulted Labs (From admission, onward)     Start     Ordered   04/16/23 0500  Culture, blood (Routine X 2) w Reflex to ID Panel  BLOOD CULTURE X 2,   R (with TIMED occurrences)      04/15/23 1759            Vitals/Pain Today's Vitals   04/16/23 0456 04/16/23 0500 04/16/23 0600 04/16/23 0851  BP: 128/77 (!) 140/79 129/80 (!) 154/93  Pulse: 90 90 84 72  Resp: 16 (!) 22 (!) 21 20  Temp: 97.9 F (36.6 C)   (!) 97.4 F (36.3 C)  TempSrc: Oral     SpO2: 97% 96% 97% 95%  Weight:      Height:      PainSc: 0-No pain       Isolation Precautions Droplet precaution  Medications Medications  acetaminophen (TYLENOL) tablet 650 mg (650 mg Oral Given 04/15/23 0315)  allopurinol (ZYLOPRIM) tablet 450 mg (450 mg Oral Given 04/15/23 0903)  aluminum hydroxide-magnesium carbonate (GAVISCON) 95-358 MG/15ML suspension 15 mL (has no administration in time range)  apixaban (ELIQUIS) tablet 2.5 mg (2.5 mg Oral Given 04/15/23 2132)  ascorbic acid (VITAMIN C) tablet 500 mg (500 mg Oral Given 04/15/23 0718)  atorvastatin (LIPITOR) tablet 80 mg (80 mg Oral Given 04/15/23 0858)  pantoprazole (PROTONIX) EC tablet 80 mg (80 mg Oral Given 04/15/23 1908)  ezetimibe (ZETIA) tablet 10 mg (10 mg Oral Given 04/15/23 0903)  dapagliflozin propanediol (FARXIGA) tablet 10 mg (10 mg Oral Given 04/15/23 0953)  fenofibrate tablet 160 mg (160 mg Oral Given 04/15/23 0901)  folic acid (FOLVITE) tablet 2 mg (2 mg Oral Given 04/15/23 0857)  glimepiride (AMARYL) tablet 2 mg (2 mg Oral Given 04/15/23 0954)  icosapent Ethyl (VASCEPA) 1 g capsule 2 g (2 g Oral Given 04/15/23 2133)  potassium chloride SA (KLOR-CON M) CR tablet 40 mEq (40 mEq Oral Given 04/15/23 0900)  metoprolol succinate (TOPROL-XL) 24 hr tablet 25 mg (25 mg Oral Given  04/15/23 0859)  nitroGLYCERIN (NITROSTAT) SL tablet 0.4 mg (has no administration in time range)  pregabalin (LYRICA) capsule 100 mg (100 mg Oral Given 04/15/23 0900)  pregabalin (LYRICA) capsule 150 mg (150 mg Oral Given 04/15/23 2131)  cefTRIAXone (ROCEPHIN) 2 g in sodium chloride 0.9 % 100 mL IVPB (0 g Intravenous Stopped 04/15/23 2229)  ampicillin (OMNIPEN) 2 g in sodium chloride 0.9 % 100 mL IVPB (0 g Intravenous Stopped 04/16/23 0555)  insulin aspart (novoLOG) injection 0-15 Units (has no administration in time range)  insulin aspart (novoLOG) injection 0-5 Units ( Subcutaneous Not Given 04/15/23 2143)  acetaminophen (TYLENOL) tablet 650 mg (has no administration in time range)    Or  acetaminophen (TYLENOL) suppository 650 mg (has no administration in time range)  traZODone (DESYREL) tablet 25 mg (has no administration in time range)  ondansetron (ZOFRAN) tablet 4 mg (has no administration in time range)    Or  ondansetron (ZOFRAN) injection 4 mg (has no administration in time range)  albuterol (PROVENTIL) (2.5 MG/3ML) 0.083% nebulizer solution 2.5 mg (has no administration in time range)  loratadine (CLARITIN) tablet 10 mg (has no administration in time range)  acetaminophen (TYLENOL) tablet 1,000 mg (1,000 mg Oral Given 04/14/23 2018)  ceFEPIme (MAXIPIME) 2 g  in sodium chloride 0.9 % 100 mL IVPB (0 g Intravenous Stopped 04/14/23 2142)  vancomycin (VANCOREADY) IVPB 1500 mg/300 mL (0 mg Intravenous Stopped 04/15/23 0158)  sodium chloride 0.9 % bolus 1,000 mL (0 mLs Intravenous Stopped 04/14/23 2338)  iohexol (OMNIPAQUE) 300 MG/ML solution 80 mL (80 mLs Intravenous Contrast Given 04/14/23 2250)  pregabalin (LYRICA) capsule 150 mg (150 mg Oral Given 04/15/23 0044)  cetirizine HCl (Zyrtec) 5 MG/5ML solution 5 mg (5 mg Oral Given 04/15/23 2308)    Mobility walks

## 2023-04-16 NOTE — Plan of Care (Signed)
Problem: Coping: Goal: Ability to adjust to condition or change in health will improve Outcome: Progressing   Problem: Nutritional: Goal: Maintenance of adequate nutrition will improve Outcome: Progressing   Problem: Skin Integrity: Goal: Risk for impaired skin integrity will decrease Outcome: Progressing   Problem: Education: Goal: Knowledge of General Education information will improve Description: Including pain rating scale, medication(s)/side effects and non-pharmacologic comfort measures Outcome: Progressing   Problem: Health Behavior/Discharge Planning: Goal: Ability to manage health-related needs will improve Outcome: Progressing

## 2023-04-16 NOTE — Hospital Course (Addendum)
82 y.o.m w/ skin keratosis, non-insulin type 2 diabetes, CAD, diffuse large B-cell lymphoma, pacemaker with recent reimplantation 04/01/2023 being admitted to the hospital with bacteremia and sepsis.  Patient states he has been in his usual state of health until 3 to 4 days ago when he started feeling unwell, with some mild congestion like symptoms, but mainly severe weakness and lethargy.  He had bodyaches, and a fever at home when he first presented to the ED 10/14.  He was seen by his PCP on 10/11 and given prior history of rapid progression to sepsis he was prescribed doxycycline 100 mg p.o. twice daily x 7 days which he started taking but it did not help much.Rapid flu and COVID were both negative in the office that day.   ED Course:Fever up to 102.Initially also tachycardic, blood pressure has been low but stable. Labs>CBC and CMP essentially unremarkable.  Lactate 1.7.Blood cultures  SENT-  positive for Enterococcus faecalis. IV antibiotics were adjusted. Initially the patient had been accepted to [facility] since he had his pacemaker implanted there recently, but they have no beds> So admitted at [facility] for further management.

## 2023-04-16 NOTE — ED Notes (Signed)
Assumed care of patient. Patient resting comfortably in bed with no signs of acute distress noted. Asking for something for his runny nose, as the secretions have not allowed him to sleep with his BiPAP on. No other complaints. Waiting on ready hospital bed.

## 2023-04-16 NOTE — Progress Notes (Signed)
PROGRESS NOTE Richard [name]  WGN:[id number] DOB: 09/15/1940 DOA: 04/14/2023 PCP: Donato Schultz, DO  Brief Narrative/Hospital Course: 82 y.o.m w/ skin keratosis, non-insulin type 2 diabetes, CAD, diffuse large B-cell lymphoma, pacemaker with recent reimplantation 04/01/2023 being admitted to the hospital with bacteremia and sepsis.  Patient states he has been in his usual state of health until 3 to 4 days ago when he started feeling unwell, with some mild congestion like symptoms, but mainly severe weakness and lethargy.  He had bodyaches, and a fever at home when he first presented to the ED 10/14.  He was seen by his PCP on 10/11 and given prior history of rapid progression to sepsis he was prescribed doxycycline 100 mg p.o. twice daily x 7 days which he started taking but it did not help much.Rapid flu and COVID were both negative in the office that day.   ED Course:Fever up to 102.Initially also tachycardic, blood pressure has been low but stable. Labs>CBC and CMP essentially unremarkable.  Lactate 1.7.Blood cultures  SENT-  positive for Enterococcus faecalis. IV antibiotics were adjusted. Initially the patient had been accepted to [facility] since he had his pacemaker implanted there recently, but they have no beds> So admitted at [facility] for further management.      Subjective: Seen and examined, Overnight vitals/labs/events reviewed-has been afebrile, vitals stable, on RA Labs reviewed potassium 3.4 bicarb 21 creatinine stable 1.4 CBC stable no leukocytosis He is resting comfortably on bedside chair  Assessment and Plan: Active Problems:   Sepsis (HCC)   Enterococcus faecalis sepsis POA: Met sepsis criteria with fever tachycardia blood culture  04/14/23 positive on BCID- E. Faecalis, cx- GPC in pairs and chains in both aerobic and anaerobic bottle.  Repeat blood culture ordered 10/16.  ID has been consulted, continue ampicillin, ceftriaxone, follow-up echocardiogram.  Of note  patient has PM in place. Recent Labs  Lab 04/10/23 1020 04/14/23 1915 04/14/23 1929 04/16/23 0439  WBC 9.3 6.6  --  6.4  LATICACIDVEN  --   --  1.7  --     T2DM: Continue home Amaryl and Farxiga, diet control and SSI  Recent Labs  Lab 04/10/23 1020 04/14/23 2322 04/15/23 2142 04/16/23 0933  GLUCAP  --  101* 149* 116*  HGBA1C 9.1*  --   --   --       Gout Cont Allopurinol   CKD 3a: Renal function at baseline.  HTN: Continue his metoprolol,  CAD status post CABG History of AV block and pacing myopathy NICM: Leadless PPM ws removed and has new PM with leads implanted on 04/01/2023 at [facility].  Rt chest wall wound site healing,Continue his cardiac meds  PAF/flutter: History of symptomatic subclavian stenosis Continue metoprolol and Eliquis  Chronic low back pain/spinal stenosis Cont pain control  Hypokalemia: Replace.  Mild normocytic anemia: Monitor. Recent Labs  Lab 04/10/23 1020 04/14/23 1915 04/16/23 0439  HGB 14.6 13.1 12.6*  HCT 45.5 40.3 38.8*     HLD: On Zetia, fibrate and Lipitor   Diffuse large B-cell lymphoma: Followed by Dr. Myna Hidalgo, receives Rivka Barbara every 3 months, next dose due November 2024.  He was treated with R-CHOP completed in February 2019.  Dr. Myna Hidalgo is following closely.  He does have a lymph node and paratracheal and right inguinal region, unusual for lymphoma to come back after 5 and half years but possible, he would ultimately need biopsy of the inguinal lymph node at some point  GERD: Ccont  ppi  Obesity W/Body mass index is 31.39 kg/m. Osa ON cpap: Will benefit with PCP follow-up, weight loss  healthy lifestyle. Cont CPAP at bedtime   DVT prophylaxis: SCDs Start: 04/15/23 1726 apixaban (ELIQUIS) tablet 2.5 mg Start: 04/15/23 1000 Code Status:   Code Status: Full Code Family Communication: plan of care discussed with patient at bedside. Patient status is: Inpatient because of sepsis Level of care:  Progressive   Dispo: The patient is from: home            Anticipated disposition: TBD Objective: Vitals last 24 hrs: Vitals:   04/16/23 0600 04/16/23 0851 04/16/23 0957 04/16/23 1028  BP: 129/80 (!) 154/93 (!) 137/94 123/79  Pulse: 84 72 96 98  Resp: (!) 21 20  16   Temp:  (!) 97.4 F (36.3 C)  97.6 F (36.4 C)  TempSrc:    Oral  SpO2: 97% 95%  97%  Weight:      Height:       Weight change:   Physical Examination: General exam: alert awake, older than stated age HEENT:Oral mucosa moist, Ear/Nose WNL grossly Respiratory system: bilaterally clear BS, no use of accessory muscle Cardiovascular system: S1 & S2 +, No JVD. Chest wall on rt with healing incision site. Non tender dry Gastrointestinal system: Abdomen soft,NT,ND, BS+.  Nervous System:Alert, awake, moving extremities. Extremities: LE edema neg,distal peripheral pulses palpable.  Skin: No rashes,no icterus. MSK: Normal muscle bulk,tone, power  Medications reviewed:  Scheduled Meds:  allopurinol  450 mg Oral Daily   apixaban  2.5 mg Oral BID   ascorbic acid  500 mg Oral Q breakfast   atorvastatin  80 mg Oral Daily   dapagliflozin propanediol  10 mg Oral Q breakfast   ezetimibe  10 mg Oral Daily   fenofibrate  160 mg Oral Daily   folic acid  2 mg Oral Daily   glimepiride  2 mg Oral Q breakfast   icosapent Ethyl  2 g Oral BID   insulin aspart  0-15 Units Subcutaneous TID WC   insulin aspart  0-5 Units Subcutaneous QHS   metoprolol succinate  25 mg Oral Daily   pantoprazole  80 mg Oral Q1200   potassium chloride SA  40 mEq Oral Daily   pregabalin  100 mg Oral Daily   pregabalin  150 mg Oral QHS  Continuous Infusions:  ampicillin (OMNIPEN) IV Stopped (04/16/23 0555)   cefTRIAXone (ROCEPHIN)  IV 2 g (04/16/23 1003)    Diet Order             Diet Carb Modified Fluid consistency: Thin; Room service appropriate? Yes  Diet effective now                  Intake/Output Summary (Last 24 hours) at 04/16/2023  1158 Last data filed at 04/16/2023 0445 Gross per 24 hour  Intake 100.36 ml  Output 775 ml  Net -674.64 ml   Net IO Since Admission: 725.36 mL [04/16/23 1158]  Wt Readings from Last 3 Encounters:  04/14/23 105 kg  04/11/23 105.7 kg  04/10/23 105.4 kg     Unresulted Labs (From admission, onward)     Start     Ordered   04/16/23 0500  Culture, blood (Routine X 2) w Reflex to ID Panel  BLOOD CULTURE X 2,   R      04/15/23 1759          Data Reviewed: I have personally reviewed following labs and imaging studies CBC: Recent  Labs  Lab 04/10/23 1020 04/14/23 1915 04/16/23 0439  WBC 9.3 6.6 6.4  NEUTROABS 6.2 5.1  --   HGB 14.6 13.1 12.6*  HCT 45.5 40.3 38.8*  MCV 90.8 90.4 90.4  PLT 253.0 184 168   Basic Metabolic Panel: Recent Labs  Lab 04/10/23 1020 04/14/23 1915 04/15/23 0730 04/16/23 0439  NA 141 134*  --  135  K 4.5 3.5  --  3.4*  CL 98 100  --  102  CO2 32 23  --  21*  GLUCOSE 138* 152*  --  120*  BUN 39* 22  --  28*  CREATININE 1.53* 1.55* 1.44* 1.49*  CALCIUM 10.4 8.3*  --  8.2*   GFR: Estimated Creatinine Clearance: 46.2 mL/min (A) (by C-G formula based on SCr of 1.49 mg/dL (H)). Liver Function Tests: Recent Labs  Lab 04/10/23 1020 04/14/23 1915  AST 21 40  ALT 23 36  ALKPHOS 69 60  BILITOT 0.8 0.8  PROT 6.8 7.0  ALBUMIN 4.5 3.7   No results for input(s): "LIPASE", "AMYLASE" in the last 168 hours. No results for input(s): "AMMONIA" in the last 168 hours. Coagulation Profile: Recent Labs  Lab 04/14/23 1915  INR 1.2  BNP (last 3 results) No results for input(s): "PROBNP" in the last 8760 hours. HbA1C: No results for input(s): "HGBA1C" in the last 72 hours. CBG: Recent Labs  Lab 04/14/23 2322 04/15/23 2142 04/16/23 0933  GLUCAP 101* 149* 116*   Recent Labs  Lab 04/14/23 1929  LATICACIDVEN 1.7    Recent Results (from the past 240 hour(s))  Culture, blood (Routine x 2)     Status: None (Preliminary result)   Collection Time:  04/14/23  7:11 PM   Specimen: BLOOD  Result Value Ref Range Status   Specimen Description   Final    BLOOD RIGHT ANTECUBITAL Performed at [facility], [address] [place], [place] [address]    Special Requests   Final    BOTTLES DRAWN AEROBIC AND ANAEROBIC Blood Culture adequate volume Performed at [facility], [address] [place], [place] [address]    Culture  Setup Time   Final    GRAM POSITIVE COCCI IN PAIRS AND CHAINS IN BOTH AEROBIC AND ANAEROBIC BOTTLES CRITICAL VALUE NOTED.  VALUE IS CONSISTENT WITH PREVIOUSLY REPORTED AND CALLED VALUE. Performed at [facility], [address] [place], [place] [address]    Culture GRAM POSITIVE COCCI  Final   Report Status PENDING  Incomplete  Culture, blood (Routine x 2)     Status: Abnormal (Preliminary result)   Collection Time: 04/14/23  7:29 PM   Specimen: BLOOD  Result Value Ref Range Status   Specimen Description   Final    BLOOD LEFT ANTECUBITAL Performed at [facility], [address], [place], [place] [address]    Special Requests   Final    BOTTLES DRAWN AEROBIC AND ANAEROBIC Blood Culture results may not be optimal due to an excessive volume of blood received in culture bottles Performed at [facility], [address], [place], [place] [address]    Culture  Setup Time   Final    GRAM POSITIVE COCCI IN PAIRS AND CHAINS IN BOTH AEROBIC AND ANAEROBIC BOTTLES CRITICAL RESULT CALLED TO, READ BACK BY AND VERIFIED WITH: PHARMD N.GOOGOVAC AT 1455 ON 04/15/2023 BY T.SAAD.    Culture (A)  Final    ENTEROCOCCUS FAECALIS SUSCEPTIBILITIES TO FOLLOW Performed at [facility], [address] [place], [place] [address]    Report Status PENDING  Incomplete  Blood Culture ID Panel (Reflexed)     Status: Abnormal   Collection Time: 04/14/23  7:29 PM  Result Value Ref Range Status   Enterococcus faecalis DETECTED (A) NOT DETECTED Final     Comment: CRITICAL RESULT CALLED TO, READ BACK BY AND VERIFIED WITH: PHARMD N.GOOGOVAC AT 1455 ON 04/15/2023 BY T.SAAD.    Enterococcus Faecium NOT DETECTED NOT DETECTED Final   Listeria monocytogenes NOT DETECTED NOT DETECTED Final   Staphylococcus species NOT DETECTED NOT DETECTED Final   Staphylococcus aureus (BCID) NOT DETECTED NOT DETECTED Final   Staphylococcus epidermidis NOT DETECTED NOT DETECTED Final   Staphylococcus lugdunensis NOT DETECTED NOT DETECTED Final   Streptococcus species NOT DETECTED NOT DETECTED Final   Streptococcus agalactiae NOT DETECTED NOT DETECTED Final   Streptococcus pneumoniae NOT DETECTED NOT DETECTED Final   Streptococcus pyogenes NOT DETECTED NOT DETECTED Final   A.calcoaceticus-baumannii NOT DETECTED NOT DETECTED Final   Bacteroides fragilis NOT DETECTED NOT DETECTED Final   Enterobacterales NOT DETECTED NOT DETECTED Final   Enterobacter cloacae complex NOT DETECTED NOT DETECTED Final   Escherichia coli NOT DETECTED NOT DETECTED Final   Klebsiella aerogenes NOT DETECTED NOT DETECTED Final   Klebsiella oxytoca NOT DETECTED NOT DETECTED Final   Klebsiella pneumoniae NOT DETECTED NOT DETECTED Final   Proteus species NOT DETECTED NOT DETECTED Final   Salmonella species NOT DETECTED NOT DETECTED Final   Serratia marcescens NOT DETECTED NOT DETECTED Final   Haemophilus influenzae NOT DETECTED NOT DETECTED Final   Neisseria meningitidis NOT DETECTED NOT DETECTED Final   Pseudomonas aeruginosa NOT DETECTED NOT DETECTED Final   Stenotrophomonas maltophilia NOT DETECTED NOT DETECTED Final   Candida albicans NOT DETECTED NOT DETECTED Final   Candida auris NOT DETECTED NOT DETECTED Final   Candida glabrata NOT DETECTED NOT DETECTED Final   Candida krusei NOT DETECTED NOT DETECTED Final   Candida parapsilosis NOT DETECTED NOT DETECTED Final   Candida tropicalis NOT DETECTED NOT DETECTED Final   Cryptococcus neoformans/gattii NOT DETECTED NOT DETECTED Final    Vancomycin resistance NOT DETECTED NOT DETECTED Final    Comment: Performed at [facility], [address], [place], [place] [address]  Resp panel by RT-PCR (RSV, Flu A&B, Covid) Anterior Nasal Swab     Status: None   Collection Time: 04/14/23  8:03 PM   Specimen: Anterior Nasal Swab  Result Value Ref Range Status   SARS Coronavirus 2 by RT PCR NEGATIVE NEGATIVE Final    Comment: (NOTE) SARS-CoV-2 target nucleic acids are NOT DETECTED.  The SARS-CoV-2 RNA is generally detectable in upper respiratory specimens during the acute phase of infection. The lowest concentration of SARS-CoV-2 viral copies this assay can detect is 138 copies/mL. A negative result does not preclude SARS-Cov-2 infection and should not be used as the sole basis for treatment or other patient management decisions. A negative result may occur with  improper specimen collection/handling, submission of specimen other than nasopharyngeal swab, presence of viral mutation(s) within the areas targeted by this assay, and inadequate number of viral copies(<138 copies/mL). A negative result must be combined with clinical observations, patient history, and epidemiological information. The expected result is Negative.  Fact Sheet for Patients:  BloggerCourse.com  Fact Sheet for Healthcare Providers:  SeriousBroker.it  This test is no t yet approved or cleared by the [place] FDA and  has been authorized for detection and/or diagnosis of  SARS-CoV-2 by FDA under an Emergency Use Authorization (EUA). This EUA will remain  in effect (meaning this test can be used) for the duration of the COVID-19 declaration under Section 564(b)(1) of the Act, 21 U.S.C.section 360bbb-3(b)(1), unless the authorization is terminated  or revoked sooner.       Influenza A by PCR NEGATIVE NEGATIVE Final   Influenza B by PCR NEGATIVE NEGATIVE Final    Comment: (NOTE) The Xpert Xpress  SARS-CoV-2/FLU/RSV plus assay is intended as an aid in the diagnosis of influenza from Nasopharyngeal swab specimens and should not be used as a sole basis for treatment. Nasal washings and aspirates are unacceptable for Xpert Xpress SARS-CoV-2/FLU/RSV testing.  Fact Sheet for Patients: BloggerCourse.com  Fact Sheet for Healthcare Providers: SeriousBroker.it  This test is not yet approved or cleared by the [place] FDA and has been authorized for detection and/or diagnosis of SARS-CoV-2 by FDA under an Emergency Use Authorization (EUA). This EUA will remain in effect (meaning this test can be used) for the duration of the COVID-19 declaration under Section 564(b)(1) of the Act, 21 U.S.C. section 360bbb-3(b)(1), unless the authorization is terminated or revoked.     Resp Syncytial Virus by PCR NEGATIVE NEGATIVE Final    Comment: (NOTE) Fact Sheet for Patients: BloggerCourse.com  Fact Sheet for Healthcare Providers: SeriousBroker.it  This test is not yet approved or cleared by the [place] FDA and has been authorized for detection and/or diagnosis of SARS-CoV-2 by FDA under an Emergency Use Authorization (EUA). This EUA will remain in effect (meaning this test can be used) for the duration of the COVID-19 declaration under Section 564(b)(1) of the Act, 21 U.S.C. section 360bbb-3(b)(1), unless the authorization is terminated or revoked.  Performed at [facility], [address], [place], [place] [address]   Respiratory (~20 pathogens) panel by PCR     Status: None   Collection Time: 04/14/23  8:41 PM   Specimen: Nasopharyngeal Swab; Respiratory  Result Value Ref Range Status   Adenovirus NOT DETECTED NOT DETECTED Final   Coronavirus 229E NOT DETECTED NOT DETECTED Final    Comment: (NOTE) The Coronavirus on the Respiratory Panel, DOES NOT test for  the novel  Coronavirus (2019 nCoV)    Coronavirus HKU1 NOT DETECTED NOT DETECTED Final   Coronavirus NL63 NOT DETECTED NOT DETECTED Final   Coronavirus OC43 NOT DETECTED NOT DETECTED Final   Metapneumovirus NOT DETECTED NOT DETECTED Final   Rhinovirus / Enterovirus NOT DETECTED NOT DETECTED Final   Influenza A NOT DETECTED NOT DETECTED Final   Influenza B NOT DETECTED NOT DETECTED Final   Parainfluenza Virus 1 NOT DETECTED NOT DETECTED Final   Parainfluenza Virus 2 NOT DETECTED NOT DETECTED Final   Parainfluenza Virus 3 NOT DETECTED NOT DETECTED Final   Parainfluenza Virus 4 NOT DETECTED NOT DETECTED Final   Respiratory Syncytial Virus NOT DETECTED NOT DETECTED Final   Bordetella pertussis NOT DETECTED NOT DETECTED Final   Bordetella Parapertussis NOT DETECTED NOT DETECTED Final   Chlamydophila pneumoniae NOT DETECTED NOT DETECTED Final   Mycoplasma pneumoniae NOT DETECTED NOT DETECTED Final    Comment: Performed at [facility], [address], [place], [place] [address]  Group A Strep by PCR     Status: None   Collection Time: 04/14/23  9:06 PM   Specimen: Throat; Sterile Swab  Result Value Ref Range Status   Group A Strep by PCR NOT DETECTED NOT DETECTED Final    Comment: Performed at [facility], [address], [place], [place] [address]    Antimicrobials: Anti-infectives (From admission, onward)    Start     Dose/Rate Route Frequency Ordered Stop   04/15/23 2200  vancomycin (VANCOCIN) IVPB 1000 mg/200 mL premix  Status:  Discontinued        1,000 mg 200 mL/hr over 60 Minutes Intravenous Every 24 hours 04/14/23 2109 04/15/23 1533   04/15/23 2200  cefTRIAXone (ROCEPHIN) 2 g in sodium chloride 0.9 % 100 mL IVPB        2 g 200 mL/hr over 30 Minutes Intravenous Every 12 hours 04/15/23 1541     04/15/23 1800  ampicillin (OMNIPEN) 2 g in sodium chloride 0.9 % 100 mL IVPB        2 g 300 mL/hr over 20 Minutes Intravenous Every 6 hours 04/15/23 1541      04/15/23 1100  cefTRIAXone (ROCEPHIN) 2 g in sodium chloride 0.9 % 100 mL IVPB  Status:  Discontinued        2 g 200 mL/hr over 30 Minutes Intravenous Every 24 hours 04/15/23 1003 04/15/23 1541   04/14/23 2115  vancomycin (VANCOREADY) IVPB 1500 mg/300 mL        1,500 mg 150 mL/hr over 120 Minutes Intravenous  Once 04/14/23 2108 04/15/23 0158   04/14/23 2045  ceFEPIme (MAXIPIME) 2 g in sodium chloride 0.9 % 100 mL IVPB        2 g 200 mL/hr over 30 Minutes Intravenous  Once 04/14/23 2041 04/14/23 2142      Culture/Microbiology    Component Value Date/Time   SDES  04/14/2023 1929    BLOOD LEFT ANTECUBITAL Performed at [facility], [address] [place], [place] [address]    SPECREQUEST  04/14/2023 1929    BOTTLES DRAWN AEROBIC AND ANAEROBIC Blood Culture results may not be optimal due to an excessive volume of blood received in culture bottles Performed at [facility], [address] [place], [place] [address]    CULT (A) 04/14/2023 1929    ENTEROCOCCUS FAECALIS SUSCEPTIBILITIES TO FOLLOW Performed at [facility], [address], [place], [place] [address]    REPTSTATUS PENDING 04/14/2023 1929    Radiology Studies: CT CHEST ABDOMEN PELVIS W CONTRAST  Result Date: 04/15/2023 CLINICAL DATA:  Sepsis.  History of lymphoma. EXAM: CT CHEST, ABDOMEN, AND PELVIS WITH CONTRAST TECHNIQUE: Multidetector CT imaging of the chest, abdomen and pelvis was performed following the standard protocol during bolus administration of intravenous contrast. RADIATION DOSE REDUCTION: This exam was performed according to the departmental dose-optimization program which includes automated exposure control, adjustment of the mA and/or kV according to patient size and/or use of iterative reconstruction technique. CONTRAST:  80mL OMNIPAQUE IOHEXOL 300 MG/ML  SOLN COMPARISON:  Chest CT 10/24/2020.  PET-CT 02/24/2018. FINDINGS: CT CHEST FINDINGS Cardiovascular: Heart  is enlarged. Pacemaker and aortic valve are again noted. Aorta is normal in size. There are atherosclerotic calcifications of the aorta and coronary arteries. There is no pericardial effusion. Mediastinum/Nodes: There is an enlarged high left paratracheal lymph node measuring 14 mm short axis image 2/12 which has increased in size. There are no other enlarged mediastinal, hilar or axillary lymph nodes present. The visualized esophagus and thyroid gland are within normal limits. Lungs/Pleura: There is minimal atelectasis in the lung bases. There some minimal peripheral reticular opacities in both lung bases as well. There is no focal lung infiltrate, pleural effusion or pneumothorax. There is scarring in both lung  apices. Musculoskeletal: Sternotomy wires are present. No acute fracture or focal osseous lesion identified. Degenerative changes affect the spine. CT ABDOMEN PELVIS FINDINGS Hepatobiliary: No focal liver abnormality is seen. No gallstones, gallbladder wall thickening, or biliary dilatation. Pancreas: Rounded low-attenuation/cystic areas measuring up to 11 mm in the pancreatic tail appear more prominent when compared to the prior study. No pancreatic ductal dilatation or surrounding inflammatory changes. Spleen: Normal in size without focal abnormality. Adrenals/Urinary Tract: Trabeculated bladder wall is seen near the dome of the bladder. No bladder calculi are seen. The bladder is distended. The kidneys and adrenal glands are within normal limits. Stomach/Bowel: Stomach is within normal limits. No evidence of bowel wall thickening, distention, or inflammatory changes. The appendix is not seen. There is sigmoid colon diverticulosis. Vascular/Lymphatic: Aortic atherosclerosis. Aorta and IVC are normal in size. There is an enlarged right inguinal lymph node measuring 1.0 x 2.3 cm which is new from prior. Reproductive: Prostate gland is enlarged. Other: No abdominal wall hernia or abnormality. No  abdominopelvic ascites. Musculoskeletal: No acute or significant osseous findings. IMPRESSION: 1. New enlarged left paratracheal lymph node and right inguinal lymph node. Findings may be related to patient's history of lymphoma. 2. Cardiomegaly. 3. Minimal peripheral reticular opacities in the lung bases may represent mild interstitial lung disease. 4. Trabeculated bladder wall near the dome of the bladder. Correlate clinically for cystitis. 5. Prostate gland is enlarged. 6. Colonic diverticulosis without evidence for diverticulitis. 7. Cystic areas in the pancreatic tail appear more prominent when compared to the prior. This can be further evaluated with MRI. Aortic Atherosclerosis (ICD10-I70.0). Electronically Signed   By: Darliss Cheney M.D.   On: 04/15/2023 01:21   DG Chest Port 1 View  Result Date: 04/14/2023 CLINICAL DATA:  Sepsis EXAM: PORTABLE CHEST 1 VIEW COMPARISON:  06/03/2022 FINDINGS: Right-sided pacing device with leads projecting over right atrium and right ventricle. Post sternotomy changes and valve prosthesis. Previously noted lead this pacemaker no longer visualized. No focal airspace disease or effusion. Stable cardiomediastinal silhouette with aortic atherosclerosis. IMPRESSION: No active disease. Electronically Signed   By: Jasmine Pang M.D.   On: 04/14/2023 22:06     LOS: 1 day   Lanae Boast, MD [facility]  04/16/2023, 11:58 AM

## 2023-04-16 NOTE — Consult Note (Signed)
Mr. Westcott is well-known to [place].  He is a very nice 82 year old white male.  He is being seen in the [facility] ER because of what is felt to be sepsis.  We have known him for about 5 or 6 years.  We first saw him when he presented with large cell non-Hodgkin lymphoma.  He was treated with systemic chemotherapy with R-CHOP.  He had a signs of chemotherapy.  He completed treatment back in February 2019.  He had an unusual lymphoma and that there is mostly in the bones.  He has been getting Xgeva every 3 months.  He does have a lot of cardiac history.  He has had multiple cardiac surgeries.  He recently had a pacemaker put in.  This was done at [facility].  He had 1 done at the a couple weeks ago or so.  He said he also had a catheterization through his inguinal areas.  When he was admitted to the ER, he had a CT scan that was done.  This was done on 04/14/2023.  This showed a enlarged high left paratracheal lymph node measuring 14 mm.  No other adenopathy was noted.  He also found to have a right inguinal lymph node measuring 10 x 23 mm.  This is felt to be new.  He had cardiomegaly.  There is no splenomegaly.  I guess the real issue is whether or not this is recurrence of his lymphoma.  He was brought to the ER by EMS because he was not feeling well.  He had a temperature 102 degrees.  He felt very weak.  He had no chills.  He had no bleeding.  He had no nausea or vomiting.  No diarrhea.  He was checked for COVID and influenza.  Both were negative.  Today, his labs show white cell count 6.4.  Hemoglobin 12.6.  Platelet count 168,000.  Sodium 135.  Potassium 3.4.  BUN 28 creatinine 1.49.  Calcium 8.2.  Had LFTs 2 days ago which were normal.  Otherwise, he has been doing pretty well.  Is been playing golf.  Been staying active.  We actually saw in the office recently.  He does get B12 injections.  His vital signs are temperature 97.9.  Pulse 84.  Blood pressure 129/80.  His head neck exam shows no  ocular or oral lesions.  He has no adenopathy in the neck.  Lungs are clear bilaterally.  He has good air movement bilaterally.  Cardiac exam regular rate and rhythm.  He has occasional extra beat.  Abdomen is obese but soft.  He has decent bowel sounds.  There is no guarding or rebound tenderness.  He has no fluid wave.  There is no obvious adenopathy in the inguinal area bilaterally.  There is no palpable liver or spleen tip.  Extremity shows no clubbing, cyanosis or edema.  Skin exam shows some scattered ecchymoses.  Neurological exam is nonfocal.    Mr. Hyland is an 82 year old white male.  He was admitted with sepsis.  Apparently, he says he is going to be transferred over to [facility].  Apparently there is some thought as the pacemaker might be a problem for him.  He wants to go to where the pacemaker was put in.  I find interesting that he is has a lymph node in the paratracheal and in the right inguinal region.  Again, it would be unusual for lymphoma to come back after 5 and half years.  However, it is certainly not  unheard of.  I thought it was interesting that he had inguinal catheterizations.  It is possible that this lymph node might be reactive.  Ultimately, I think he is going to need a biopsy of the inguinal lymph node.  He is on blood thinner right now.  We can certainly do this later when he improves.  I suppose that when he gets to [facility], he can be seen by oncology there and they can decide how to proceed.  As always, he looks quite good.  He really has had no complaints outside not feeling well.  We will have about any way that we can.  Hopefully, he will be transferred to [facility] today.    Christin Bach, MD   Romans 8:18

## 2023-04-16 NOTE — Progress Notes (Signed)
  Echocardiogram 2D Echocardiogram has been performed.  Milda Smart 04/16/2023, 4:16 PM

## 2023-04-16 NOTE — Discharge Summary (Signed)
Physician Discharge Summary  Richard [name] QQV:[id number] DOB: [birth year] DOA: 04/14/2023  PCP: Donato Schultz, DO  Admit date: 04/14/2023 Discharge date: 04/16/2023 Recommendations for Outpatient Follow-up:  Follow up with PCP in 1 weeks-call for appointment Please obtain BMP/CBC in one week  Discharge Dispo: [facility] Discharge Condition: Stable Code Status:   Code Status: Full Code Diet recommendation:  Diet Order             Diet Carb Modified Fluid consistency: Thin; Room service appropriate? Yes  Diet effective now                    Brief/Interim Summary: 82 y.o.m w/ skin keratosis, non-insulin type 2 diabetes, CAD, diffuse large B-cell lymphoma, pacemaker with recent reimplantation 04/01/2023 being admitted to the hospital with bacteremia and sepsis.  Patient states he has been in his usual state of health until 3 to 4 days ago when he started feeling unwell, with some mild congestion like symptoms, but mainly severe weakness and lethargy.  He had bodyaches, and a fever at home when he first presented to the ED 10/14.  He was seen by his PCP on 10/11 and given prior history of rapid progression to sepsis he was prescribed doxycycline 100 mg p.o. twice daily x 7 days which he started taking but it did not help much.Rapid flu and COVID were both negative in the office that day.   ED Course:Fever up to 102.Initially also tachycardic, blood pressure has been low but stable. Labs>CBC and CMP essentially unremarkable.  Lactate 1.7.Blood cultures  SENT-  positive for Enterococcus faecalis. IV antibiotics were adjusted. Initially the patient had been accepted to [facility] since he had his pacemaker implanted there recently, but they have no beds> So admitted at [facility] for further management.  [facility] called back and has a bed. Due to recent PPM placement there and need to involve EP and possibly removal of PPM he is being transferred to [facility]. Dr Pearletha Forge has accepeted the  patient.   Discharge Diagnoses:  Active Problems:   Sepsis (HCC)  Enterococcus faecalis sepsis POA: Met sepsis criteria with fever tachycardia blood culture  04/14/23 positive on BCID- E. Faecalis, cx- GPC in pairs and chains in both aerobic and anaerobic bottle.  Repeat blood culture ordered 10/16.  ID has been consulted, continue ampicillin, ceftriaxone, follow-up echocardiogram.  Of note patient has PM in place. Last Labs        Recent Labs  Lab 04/10/23 1020 04/14/23 1915 04/14/23 1929 04/16/23 0439  WBC 9.3 6.6  --  6.4  LATICACIDVEN  --   --  1.7  --       T2DM: Continue home Amaryl and Farxiga, diet control and SSI  Last Labs        Recent Labs  Lab 04/10/23 1020 04/14/23 2322 04/15/23 2142 04/16/23 0933  GLUCAP  --  101* 149* 116*  HGBA1C 9.1*  --   --   --         Gout Cont Allopurinol   CKD 3a: Renal function at baseline.   HTN: Continue his metoprolol,   CAD status post CABG History of AV block and pacing myopathy NICM: Leadless PPM ws removed and has new PM with leads implanted on 04/01/2023 at [facility].  Rt chest wall wound site healing,Continue his cardiac meds   PAF/flutter: History of symptomatic subclavian stenosis Continue metoprolol and Eliquis   Chronic low back pain/spinal stenosis Cont  pain control   Hypokalemia: Replace.   Mild normocytic anemia: Monitor. Last Labs       Recent Labs  Lab 04/10/23 1020 04/14/23 1915 04/16/23 0439  HGB 14.6 13.1 12.6*  HCT 45.5 40.3 38.8*       HLD: On Zetia, fibrate and Lipitor   Diffuse large B-cell lymphoma: Followed by Dr. Myna Hidalgo, receives Rivka Barbara every 3 months, next dose due November 2024.  He was treated with R-CHOP completed in February 2019.  Dr. Myna Hidalgo is following closely.  He does have a lymph node and paratracheal and right inguinal region, unusual for lymphoma to come back after 5 and half years but possible, he would ultimately need biopsy of the inguinal  lymph node at some point   GERD: Ccont ppi   Obesity W/Body mass index is 31.39 kg/m. Osa ON cpap: Will benefit with PCP follow-up, weight loss  healthy lifestyle. Cont CPAP at bedtime   Consults: ID  Subjective: Aaox3. NO NEW COMPLAINTS  Discharge Exam: Vitals:   04/16/23 1028 04/16/23 1443  BP: 123/79 111/65  Pulse: 98 88  Resp: 16 18  Temp: 97.6 F (36.4 C) 97.7 F (36.5 C)  SpO2: 97% 96%   General: Pt is alert, awake, not in acute distress Cardiovascular: RRR, S1/S2 +, no rubs, no gallops Respiratory: CTA bilaterally, no wheezing, no rhonchi Abdominal: Soft, NT, ND, bowel sounds + Extremities: no edema, no cyanosis  Discharge Instructions   Allergies as of 04/16/2023       Reactions   Ace Inhibitors Swelling, Other (See Comments)   Angioedema   Benazepril Swelling, Other (See Comments)   Angioedema; he is not a candidate for any angiotensin receptor blockers because of this significant allergic reaction. Because of a history of documented adverse serious drug reaction;Medi Alert bracelet  is recommended   Entresto [sacubitril-valsartan] Swelling, Other (See Comments)   First-in-Class Angiotensin Receptor Neprilysin Inhibitor- Med was "red-flagged" by the patient's pharmacy for him to NOT take!   Hctz [hydrochlorothiazide] Anaphylaxis, Swelling   Tongue and lip swelling   Aspirin Other (See Comments)   Gastritis, can aspirin not take 325 mg aspirin        Medication List     TAKE these medications    acetaminophen 325 MG tablet Commonly known as: TYLENOL Take 650 mg by mouth at bedtime as needed for moderate pain or headache.   allopurinol 300 MG tablet Commonly known as: ZYLOPRIM Take 450 mg by mouth daily.   aluminum hydroxide-magnesium carbonate 95-358 MG/15ML Susp Commonly known as: GAVISCON Take 15 mLs by mouth as needed for indigestion or heartburn.   ampicillin 2 g in sodium chloride 0.9 % 100 mL Inject 2 g into the vein every 6 (six)  hours.   atorvastatin 80 MG tablet Commonly known as: LIPITOR TAKE 1 TABLET BY MOUTH EVERYDAY AT BEDTIME   Azelastine HCl 137 MCG/SPRAY Soln PLACE 2 SPRAYS INTO BOTH NOSTRILS AT BEDTIME AS NEEDED FOR RHINITIS OR ALLERGIES.   cefTRIAXone 2 g in sodium chloride 0.9 % 100 mL Inject 2 g into the vein every 12 (twelve) hours.   CO Q-10 PO Take 1 capsule by mouth daily.   doxycycline 100 MG tablet Commonly known as: VIBRA-TABS Take 1 tablet (100 mg total) by mouth 2 (two) times daily.   Eliquis 2.5 MG Tabs tablet Generic drug: apixaban TAKE 1 TABLET BY MOUTH TWICE A DAY   esomeprazole 40 MG capsule Commonly known as: NexIUM Take 1 capsule (40 mg total) by mouth daily.  What changed: when to take this   ezetimibe 10 MG tablet Commonly known as: ZETIA Take 1 tablet (10 mg total) by mouth daily.   Farxiga 10 MG Tabs tablet Generic drug: dapagliflozin propanediol Take 10 mg by mouth in the morning.   fenofibrate 160 MG tablet Take 1 tablet (160 mg total) by mouth daily.   folic acid 1 MG tablet Commonly known as: FOLVITE TAKE 2 TABLETS BY MOUTH EVERY DAY   freestyle lancets USE ONCE A DAY TO CHECK BLOOD SUGAR.   FREESTYLE LITE test strip Generic drug: glucose blood USE TO TEST BLOOD SUGAR ONCE A DAY. DX CODE: E11.9   glimepiride 2 MG tablet Commonly known as: AMARYL Take 1 tablet (2 mg total) by mouth in the morning and at bedtime.   icosapent Ethyl 1 g capsule Commonly known as: Vascepa Take 2 capsules (2 g total) by mouth 2 (two) times daily.   ICY HOT ADVANCED PAIN RELIEF EX Apply 1 application  topically daily as needed (to painful sites).   Klor-Con M20 20 MEQ tablet Generic drug: potassium chloride SA TAKE 2 TABLETS BY MOUTH DAILY   metoprolol succinate 25 MG 24 hr tablet Commonly known as: TOPROL-XL TAKE 1 TABLET (25 MG TOTAL) BY MOUTH DAILY.   nitroGLYCERIN 0.4 MG SL tablet Commonly known as: NITROSTAT PLACE 1 TABLET UNDER THE TONGUE EVERY 5 MINUTES  AS NEEDED FOR CHEST PAIN.   pregabalin 100 MG capsule Commonly known as: Lyrica Take 1 capsule (100 mg total) by mouth daily. At noon   pregabalin 150 MG capsule Commonly known as: LYRICA Take 1 capsule (150 mg total) by mouth at bedtime. (Take 100mg  daily at noon)   psyllium 58.6 % powder Commonly known as: METAMUCIL Take 1 packet by mouth daily as needed (for constipation- mix and drink).   senna 8.6 MG Tabs tablet Commonly known as: SENOKOT Take 2 tablets (17.2 mg total) by mouth at bedtime. This is for constipation.  Stop taking if you start having diarrhea.   torsemide 20 MG tablet Commonly known as: DEMADEX Take 2 tablets (40 mg total) by mouth in the morning. Take extra tablet for weight gain more then 2 to 3 lbs in 24 hours.   Trulicity 3 MG/0.5ML Sopn Generic drug: Dulaglutide Inject 3 mg into the skin once a week.   VITAMIN C PO Take 1 tablet by mouth daily with breakfast.        Allergies  Allergen Reactions   Ace Inhibitors Swelling and Other (See Comments)    Angioedema    Benazepril Swelling and Other (See Comments)    Angioedema; he is not a candidate for any angiotensin receptor blockers because of this significant allergic reaction. Because of a history of documented adverse serious drug reaction;Medi Alert bracelet  is recommended   Entresto [Sacubitril-Valsartan] Swelling and Other (See Comments)    First-in-Class Angiotensin Receptor Neprilysin Inhibitor- Med was "red-flagged" by the patient's pharmacy for him to NOT take!   Hctz [Hydrochlorothiazide] Anaphylaxis and Swelling    Tongue and lip swelling    Aspirin Other (See Comments)    Gastritis, can aspirin not take 325 mg aspirin    The results of significant diagnostics from this hospitalization (including imaging, microbiology, ancillary and laboratory) are listed below for reference.    Microbiology: Recent Results (from the past 240 hour(s))  Culture, blood (Routine x 2)     Status:  Abnormal (Preliminary result)   Collection Time: 04/14/23  7:11 PM   Specimen: BLOOD  Result Value Ref Range Status   Specimen Description   Final    BLOOD RIGHT ANTECUBITAL Performed at [facility], [address] [place], [place] [address]    Special Requests   Final    BOTTLES DRAWN AEROBIC AND ANAEROBIC Blood Culture adequate volume Performed at [facility], [address] [place], [place] [address]    Culture  Setup Time   Final    GRAM POSITIVE COCCI IN PAIRS AND CHAINS IN BOTH AEROBIC AND ANAEROBIC BOTTLES CRITICAL VALUE NOTED.  VALUE IS CONSISTENT WITH PREVIOUSLY REPORTED AND CALLED VALUE. Performed at [facility], [address] [place], [place] [address]    Culture ENTEROCOCCUS FAECALIS (A)  Final   Report Status PENDING  Incomplete  Culture, blood (Routine x 2)     Status: Abnormal (Preliminary result)   Collection Time: 04/14/23  7:29 PM   Specimen: BLOOD  Result Value Ref Range Status   Specimen Description   Final    BLOOD LEFT ANTECUBITAL Performed at [facility], [address] [place], [place] [address]    Special Requests   Final    BOTTLES DRAWN AEROBIC AND ANAEROBIC Blood Culture results may not be optimal due to an excessive volume of blood received in culture bottles Performed at [facility], [address], [place], [place] [address]    Culture  Setup Time   Final    GRAM POSITIVE COCCI IN PAIRS AND CHAINS IN BOTH AEROBIC AND ANAEROBIC BOTTLES CRITICAL RESULT CALLED TO, READ BACK BY AND VERIFIED WITH: PHARMD N.GOOGOVAC AT 1455 ON 04/15/2023 BY T.SAAD.    Culture (A)  Final    ENTEROCOCCUS FAECALIS SUSCEPTIBILITIES TO FOLLOW Performed at [facility], [address], [place], [place] [address]    Report Status PENDING  Incomplete  Blood Culture ID Panel (Reflexed)     Status: Abnormal   Collection Time: 04/14/23  7:29 PM  Result Value Ref Range Status    Enterococcus faecalis DETECTED (A) NOT DETECTED Final    Comment: CRITICAL RESULT CALLED TO, READ BACK BY AND VERIFIED WITH: PHARMD N.GOOGOVAC AT 1455 ON 04/15/2023 BY T.SAAD.    Enterococcus Faecium NOT DETECTED NOT DETECTED Final   Listeria monocytogenes NOT DETECTED NOT DETECTED Final   Staphylococcus species NOT DETECTED NOT DETECTED Final   Staphylococcus aureus (BCID) NOT DETECTED NOT DETECTED Final   Staphylococcus epidermidis NOT DETECTED NOT DETECTED Final   Staphylococcus lugdunensis NOT DETECTED NOT DETECTED Final   Streptococcus species NOT DETECTED NOT DETECTED Final   Streptococcus agalactiae NOT DETECTED NOT DETECTED Final   Streptococcus pneumoniae NOT DETECTED NOT DETECTED Final   Streptococcus pyogenes NOT DETECTED NOT DETECTED Final   A.calcoaceticus-baumannii NOT DETECTED NOT DETECTED Final   Bacteroides fragilis NOT DETECTED NOT DETECTED Final   Enterobacterales NOT DETECTED NOT DETECTED Final   Enterobacter cloacae complex NOT DETECTED NOT DETECTED Final   Escherichia coli NOT DETECTED NOT DETECTED Final   Klebsiella aerogenes NOT DETECTED NOT DETECTED Final   Klebsiella oxytoca NOT DETECTED NOT DETECTED Final   Klebsiella pneumoniae NOT DETECTED NOT DETECTED Final   Proteus species NOT DETECTED NOT DETECTED Final   Salmonella species NOT DETECTED NOT DETECTED Final   Serratia marcescens NOT DETECTED NOT DETECTED Final   Haemophilus influenzae NOT DETECTED NOT DETECTED Final   Neisseria meningitidis NOT DETECTED NOT DETECTED Final   Pseudomonas aeruginosa NOT DETECTED NOT DETECTED Final   Stenotrophomonas maltophilia NOT DETECTED NOT DETECTED  Final   Candida albicans NOT DETECTED NOT DETECTED Final   Candida auris NOT DETECTED NOT DETECTED Final   Candida glabrata NOT DETECTED NOT DETECTED Final   Candida krusei NOT DETECTED NOT DETECTED Final   Candida parapsilosis NOT DETECTED NOT DETECTED Final   Candida tropicalis NOT DETECTED NOT DETECTED Final    Cryptococcus neoformans/gattii NOT DETECTED NOT DETECTED Final   Vancomycin resistance NOT DETECTED NOT DETECTED Final    Comment: Performed at [facility], [address], [place], [place] [address]  Resp panel by RT-PCR (RSV, Flu A&B, Covid) Anterior Nasal Swab     Status: None   Collection Time: 04/14/23  8:03 PM   Specimen: Anterior Nasal Swab  Result Value Ref Range Status   SARS Coronavirus 2 by RT PCR NEGATIVE NEGATIVE Final    Comment: (NOTE) SARS-CoV-2 target nucleic acids are NOT DETECTED.  The SARS-CoV-2 RNA is generally detectable in upper respiratory specimens during the acute phase of infection. The lowest concentration of SARS-CoV-2 viral copies this assay can detect is 138 copies/mL. A negative result does not preclude SARS-Cov-2 infection and should not be used as the sole basis for treatment or other patient management decisions. A negative result may occur with  improper specimen collection/handling, submission of specimen other than nasopharyngeal swab, presence of viral mutation(s) within the areas targeted by this assay, and inadequate number of viral copies(<138 copies/mL). A negative result must be combined with clinical observations, patient history, and epidemiological information. The expected result is Negative.  Fact Sheet for Patients:  BloggerCourse.com  Fact Sheet for Healthcare Providers:  SeriousBroker.it  This test is no t yet approved or cleared by the [place] FDA and  has been authorized for detection and/or diagnosis of SARS-CoV-2 by FDA under an Emergency Use Authorization (EUA). This EUA will remain  in effect (meaning this test can be used) for the duration of the COVID-19 declaration under Section 564(b)(1) of the Act, 21 U.S.C.section 360bbb-3(b)(1), unless the authorization is terminated  or revoked sooner.       Influenza A by PCR NEGATIVE NEGATIVE Final   Influenza B by  PCR NEGATIVE NEGATIVE Final    Comment: (NOTE) The Xpert Xpress SARS-CoV-2/FLU/RSV plus assay is intended as an aid in the diagnosis of influenza from Nasopharyngeal swab specimens and should not be used as a sole basis for treatment. Nasal washings and aspirates are unacceptable for Xpert Xpress SARS-CoV-2/FLU/RSV testing.  Fact Sheet for Patients: BloggerCourse.com  Fact Sheet for Healthcare Providers: SeriousBroker.it  This test is not yet approved or cleared by the [place] FDA and has been authorized for detection and/or diagnosis of SARS-CoV-2 by FDA under an Emergency Use Authorization (EUA). This EUA will remain in effect (meaning this test can be used) for the duration of the COVID-19 declaration under Section 564(b)(1) of the Act, 21 U.S.C. section 360bbb-3(b)(1), unless the authorization is terminated or revoked.     Resp Syncytial Virus by PCR NEGATIVE NEGATIVE Final    Comment: (NOTE) Fact Sheet for Patients: BloggerCourse.com  Fact Sheet for Healthcare Providers: SeriousBroker.it  This test is not yet approved or cleared by the [place] FDA and has been authorized for detection and/or diagnosis of SARS-CoV-2 by FDA under an Emergency Use Authorization (EUA). This EUA will remain in effect (meaning this test can be used) for the duration of the COVID-19 declaration under Section 564(b)(1) of the Act, 21 U.S.C. section 360bbb-3(b)(1), unless the authorization is terminated or revoked.  Performed at [facility]  [facility], [address] [place], [place] [address]   Respiratory (~20 pathogens) panel by PCR     Status: None   Collection Time: 04/14/23  8:41 PM   Specimen: Nasopharyngeal Swab; Respiratory  Result Value Ref Range Status   Adenovirus NOT DETECTED NOT DETECTED Final   Coronavirus 229E NOT DETECTED NOT DETECTED Final    Comment:  (NOTE) The Coronavirus on the Respiratory Panel, DOES NOT test for the novel  Coronavirus (2019 nCoV)    Coronavirus HKU1 NOT DETECTED NOT DETECTED Final   Coronavirus NL63 NOT DETECTED NOT DETECTED Final   Coronavirus OC43 NOT DETECTED NOT DETECTED Final   Metapneumovirus NOT DETECTED NOT DETECTED Final   Rhinovirus / Enterovirus NOT DETECTED NOT DETECTED Final   Influenza A NOT DETECTED NOT DETECTED Final   Influenza B NOT DETECTED NOT DETECTED Final   Parainfluenza Virus 1 NOT DETECTED NOT DETECTED Final   Parainfluenza Virus 2 NOT DETECTED NOT DETECTED Final   Parainfluenza Virus 3 NOT DETECTED NOT DETECTED Final   Parainfluenza Virus 4 NOT DETECTED NOT DETECTED Final   Respiratory Syncytial Virus NOT DETECTED NOT DETECTED Final   Bordetella pertussis NOT DETECTED NOT DETECTED Final   Bordetella Parapertussis NOT DETECTED NOT DETECTED Final   Chlamydophila pneumoniae NOT DETECTED NOT DETECTED Final   Mycoplasma pneumoniae NOT DETECTED NOT DETECTED Final    Comment: Performed at [facility], [address], [place], [place] [address]  Group A Strep by PCR     Status: None   Collection Time: 04/14/23  9:06 PM   Specimen: Throat; Sterile Swab  Result Value Ref Range Status   Group A Strep by PCR NOT DETECTED NOT DETECTED Final    Comment: Performed at [facility], [address] [place], [place] [address]    Procedures/Studies: CT CHEST ABDOMEN PELVIS W CONTRAST  Result Date: 04/15/2023 CLINICAL DATA:  Sepsis.  History of lymphoma. EXAM: CT CHEST, ABDOMEN, AND PELVIS WITH CONTRAST TECHNIQUE: Multidetector CT imaging of the chest, abdomen and pelvis was performed following the standard protocol during bolus administration of intravenous contrast. RADIATION DOSE REDUCTION: This exam was performed according to the departmental dose-optimization program which includes automated exposure control, adjustment of the mA and/or kV according to patient size and/or  use of iterative reconstruction technique. CONTRAST:  80mL OMNIPAQUE IOHEXOL 300 MG/ML  SOLN COMPARISON:  Chest CT 10/24/2020.  PET-CT 02/24/2018. FINDINGS: CT CHEST FINDINGS Cardiovascular: Heart is enlarged. Pacemaker and aortic valve are again noted. Aorta is normal in size. There are atherosclerotic calcifications of the aorta and coronary arteries. There is no pericardial effusion. Mediastinum/Nodes: There is an enlarged high left paratracheal lymph node measuring 14 mm short axis image 2/12 which has increased in size. There are no other enlarged mediastinal, hilar or axillary lymph nodes present. The visualized esophagus and thyroid gland are within normal limits. Lungs/Pleura: There is minimal atelectasis in the lung bases. There some minimal peripheral reticular opacities in both lung bases as well. There is no focal lung infiltrate, pleural effusion or pneumothorax. There is scarring in both lung apices. Musculoskeletal: Sternotomy wires are present. No acute fracture or focal osseous lesion identified. Degenerative changes affect the spine. CT ABDOMEN PELVIS FINDINGS Hepatobiliary: No focal liver abnormality is seen. No gallstones, gallbladder wall thickening, or biliary dilatation. Pancreas: Rounded low-attenuation/cystic areas measuring up to 11 mm in the pancreatic tail appear more prominent when compared to the prior study. No pancreatic ductal dilatation or surrounding inflammatory changes. Spleen:  Normal in size without focal abnormality. Adrenals/Urinary Tract: Trabeculated bladder wall is seen near the dome of the bladder. No bladder calculi are seen. The bladder is distended. The kidneys and adrenal glands are within normal limits. Stomach/Bowel: Stomach is within normal limits. No evidence of bowel wall thickening, distention, or inflammatory changes. The appendix is not seen. There is sigmoid colon diverticulosis. Vascular/Lymphatic: Aortic atherosclerosis. Aorta and IVC are normal in size.  There is an enlarged right inguinal lymph node measuring 1.0 x 2.3 cm which is new from prior. Reproductive: Prostate gland is enlarged. Other: No abdominal wall hernia or abnormality. No abdominopelvic ascites. Musculoskeletal: No acute or significant osseous findings. IMPRESSION: 1. New enlarged left paratracheal lymph node and right inguinal lymph node. Findings may be related to patient's history of lymphoma. 2. Cardiomegaly. 3. Minimal peripheral reticular opacities in the lung bases may represent mild interstitial lung disease. 4. Trabeculated bladder wall near the dome of the bladder. Correlate clinically for cystitis. 5. Prostate gland is enlarged. 6. Colonic diverticulosis without evidence for diverticulitis. 7. Cystic areas in the pancreatic tail appear more prominent when compared to the prior. This can be further evaluated with MRI. Aortic Atherosclerosis (ICD10-I70.0). Electronically Signed   By: Darliss Cheney M.D.   On: 04/15/2023 01:21   DG Chest Port 1 View  Result Date: 04/14/2023 CLINICAL DATA:  Sepsis EXAM: PORTABLE CHEST 1 VIEW COMPARISON:  06/03/2022 FINDINGS: Right-sided pacing device with leads projecting over right atrium and right ventricle. Post sternotomy changes and valve prosthesis. Previously noted lead this pacemaker no longer visualized. No focal airspace disease or effusion. Stable cardiomediastinal silhouette with aortic atherosclerosis. IMPRESSION: No active disease. Electronically Signed   By: Jasmine Pang M.D.   On: 04/14/2023 22:06    Labs: BNP (last 3 results) No results for input(s): "BNP" in the last 8760 hours. Basic Metabolic Panel: Recent Labs  Lab 04/10/23 1020 04/14/23 1915 04/15/23 0730 04/16/23 0439  NA 141 134*  --  135  K 4.5 3.5  --  3.4*  CL 98 100  --  102  CO2 32 23  --  21*  GLUCOSE 138* 152*  --  120*  BUN 39* 22  --  28*  CREATININE 1.53* 1.55* 1.44* 1.49*  CALCIUM 10.4 8.3*  --  8.2*   Liver Function Tests: Recent Labs  Lab  04/10/23 1020 04/14/23 1915  AST 21 40  ALT 23 36  ALKPHOS 69 60  BILITOT 0.8 0.8  PROT 6.8 7.0  ALBUMIN 4.5 3.7   No results for input(s): "LIPASE", "AMYLASE" in the last 168 hours. No results for input(s): "AMMONIA" in the last 168 hours. CBC: Recent Labs  Lab 04/10/23 1020 04/14/23 1915 04/16/23 0439  WBC 9.3 6.6 6.4  NEUTROABS 6.2 5.1  --   HGB 14.6 13.1 12.6*  HCT 45.5 40.3 38.8*  MCV 90.8 90.4 90.4  PLT 253.0 184 168   Cardiac Enzymes: No results for input(s): "CKTOTAL", "CKMB", "CKMBINDEX", "TROPONINI" in the last 168 hours. BNP: Invalid input(s): "POCBNP" CBG: Recent Labs  Lab 04/14/23 2322 04/15/23 2142 04/16/23 0933 04/16/23 1202  GLUCAP 101* 149* 116* 144*   D-Dimer No results for input(s): "DDIMER" in the last 72 hours. Hgb A1c No results for input(s): "HGBA1C" in the last 72 hours. Lipid Profile No results for input(s): "CHOL", "HDL", "LDLCALC", "TRIG", "CHOLHDL", "LDLDIRECT" in the last 72 hours. Thyroid function studies No results for input(s): "TSH", "T4TOTAL", "T3FREE", "THYROIDAB" in the last 72 hours.  Invalid input(s): "FREET3" Anemia  work up No results for input(s): "VITAMINB12", "FOLATE", "FERRITIN", "TIBC", "IRON", "RETICCTPCT" in the last 72 hours. Urinalysis    Component Value Date/Time   COLORURINE STRAW (A) 04/14/2023 1954   APPEARANCEUR CLEAR 04/14/2023 1954   LABSPEC 1.009 04/14/2023 1954   PHURINE 6.0 04/14/2023 1954   GLUCOSEU >=500 (A) 04/14/2023 1954   HGBUR NEGATIVE 04/14/2023 1954   HGBUR negative 05/29/2010 1100   BILIRUBINUR NEGATIVE 04/14/2023 1954   BILIRUBINUR negative 08/10/2019 1343   KETONESUR NEGATIVE 04/14/2023 1954   PROTEINUR NEGATIVE 04/14/2023 1954   UROBILINOGEN 0.2 08/10/2019 1343   UROBILINOGEN 0.2 10/06/2014 1010   NITRITE NEGATIVE 04/14/2023 1954   LEUKOCYTESUR NEGATIVE 04/14/2023 1954   Sepsis Labs Recent Labs  Lab 04/10/23 1020 04/14/23 1915 04/16/23 0439  WBC 9.3 6.6 6.4    Microbiology Recent Results (from the past 240 hour(s))  Culture, blood (Routine x 2)     Status: Abnormal (Preliminary result)   Collection Time: 04/14/23  7:11 PM   Specimen: BLOOD  Result Value Ref Range Status   Specimen Description   Final    BLOOD RIGHT ANTECUBITAL Performed at [facility], [address] [place], [place] [address]    Special Requests   Final    BOTTLES DRAWN AEROBIC AND ANAEROBIC Blood Culture adequate volume Performed at [facility], [address], [place], [place] [address]    Culture  Setup Time   Final    GRAM POSITIVE COCCI IN PAIRS AND CHAINS IN BOTH AEROBIC AND ANAEROBIC BOTTLES CRITICAL VALUE NOTED.  VALUE IS CONSISTENT WITH PREVIOUSLY REPORTED AND CALLED VALUE. Performed at [facility], [address], [place], [place] [address]    Culture ENTEROCOCCUS FAECALIS (A)  Final   Report Status PENDING  Incomplete  Culture, blood (Routine x 2)     Status: Abnormal (Preliminary result)   Collection Time: 04/14/23  7:29 PM   Specimen: BLOOD  Result Value Ref Range Status   Specimen Description   Final    BLOOD LEFT ANTECUBITAL Performed at [facility], [address] [place], [place] [address]    Special Requests   Final    BOTTLES DRAWN AEROBIC AND ANAEROBIC Blood Culture results may not be optimal due to an excessive volume of blood received in culture bottles Performed at [facility], [address], [place], [place] [address]    Culture  Setup Time   Final    GRAM POSITIVE COCCI IN PAIRS AND CHAINS IN BOTH AEROBIC AND ANAEROBIC BOTTLES CRITICAL RESULT CALLED TO, READ BACK BY AND VERIFIED WITH: PHARMD N.GOOGOVAC AT 1455 ON 04/15/2023 BY T.SAAD.    Culture (A)  Final    ENTEROCOCCUS FAECALIS SUSCEPTIBILITIES TO FOLLOW Performed at [facility], [address] [place], [place] [address]    Report Status PENDING  Incomplete  Blood Culture ID Panel  (Reflexed)     Status: Abnormal   Collection Time: 04/14/23  7:29 PM  Result Value Ref Range Status   Enterococcus faecalis DETECTED (A) NOT DETECTED Final    Comment: CRITICAL RESULT CALLED TO, READ BACK BY AND VERIFIED WITH: PHARMD N.GOOGOVAC AT 1455 ON 04/15/2023 BY T.SAAD.    Enterococcus Faecium NOT DETECTED NOT DETECTED Final   Listeria monocytogenes NOT DETECTED NOT DETECTED Final   Staphylococcus species NOT DETECTED NOT DETECTED Final   Staphylococcus aureus (BCID) NOT DETECTED NOT DETECTED Final   Staphylococcus epidermidis NOT DETECTED NOT DETECTED Final   Staphylococcus lugdunensis NOT  DETECTED NOT DETECTED Final   Streptococcus species NOT DETECTED NOT DETECTED Final   Streptococcus agalactiae NOT DETECTED NOT DETECTED Final   Streptococcus pneumoniae NOT DETECTED NOT DETECTED Final   Streptococcus pyogenes NOT DETECTED NOT DETECTED Final   A.calcoaceticus-baumannii NOT DETECTED NOT DETECTED Final   Bacteroides fragilis NOT DETECTED NOT DETECTED Final   Enterobacterales NOT DETECTED NOT DETECTED Final   Enterobacter cloacae complex NOT DETECTED NOT DETECTED Final   Escherichia coli NOT DETECTED NOT DETECTED Final   Klebsiella aerogenes NOT DETECTED NOT DETECTED Final   Klebsiella oxytoca NOT DETECTED NOT DETECTED Final   Klebsiella pneumoniae NOT DETECTED NOT DETECTED Final   Proteus species NOT DETECTED NOT DETECTED Final   Salmonella species NOT DETECTED NOT DETECTED Final   Serratia marcescens NOT DETECTED NOT DETECTED Final   Haemophilus influenzae NOT DETECTED NOT DETECTED Final   Neisseria meningitidis NOT DETECTED NOT DETECTED Final   Pseudomonas aeruginosa NOT DETECTED NOT DETECTED Final   Stenotrophomonas maltophilia NOT DETECTED NOT DETECTED Final   Candida albicans NOT DETECTED NOT DETECTED Final   Candida auris NOT DETECTED NOT DETECTED Final   Candida glabrata NOT DETECTED NOT DETECTED Final   Candida krusei NOT DETECTED NOT DETECTED Final   Candida  parapsilosis NOT DETECTED NOT DETECTED Final   Candida tropicalis NOT DETECTED NOT DETECTED Final   Cryptococcus neoformans/gattii NOT DETECTED NOT DETECTED Final   Vancomycin resistance NOT DETECTED NOT DETECTED Final    Comment: Performed at [facility], [address] [place], [place] [address]  Resp panel by RT-PCR (RSV, Flu A&B, Covid) Anterior Nasal Swab     Status: None   Collection Time: 04/14/23  8:03 PM   Specimen: Anterior Nasal Swab  Result Value Ref Range Status   SARS Coronavirus 2 by RT PCR NEGATIVE NEGATIVE Final    Comment: (NOTE) SARS-CoV-2 target nucleic acids are NOT DETECTED.  The SARS-CoV-2 RNA is generally detectable in upper respiratory specimens during the acute phase of infection. The lowest concentration of SARS-CoV-2 viral copies this assay can detect is 138 copies/mL. A negative result does not preclude SARS-Cov-2 infection and should not be used as the sole basis for treatment or other patient management decisions. A negative result may occur with  improper specimen collection/handling, submission of specimen other than nasopharyngeal swab, presence of viral mutation(s) within the areas targeted by this assay, and inadequate number of viral copies(<138 copies/mL). A negative result must be combined with clinical observations, patient history, and epidemiological information. The expected result is Negative.  Fact Sheet for Patients:  BloggerCourse.com  Fact Sheet for Healthcare Providers:  SeriousBroker.it  This test is no t yet approved or cleared by the [place] FDA and  has been authorized for detection and/or diagnosis of SARS-CoV-2 by FDA under an Emergency Use Authorization (EUA). This EUA will remain  in effect (meaning this test can be used) for the duration of the COVID-19 declaration under Section 564(b)(1) of the Act, 21 U.S.C.section 360bbb-3(b)(1), unless the authorization is  terminated  or revoked sooner.       Influenza A by PCR NEGATIVE NEGATIVE Final   Influenza B by PCR NEGATIVE NEGATIVE Final    Comment: (NOTE) The Xpert Xpress SARS-CoV-2/FLU/RSV plus assay is intended as an aid in the diagnosis of influenza from Nasopharyngeal swab specimens and should not be used as a sole basis for treatment. Nasal washings and aspirates are unacceptable for Xpert Xpress SARS-CoV-2/FLU/RSV testing.  Fact Sheet for Patients: BloggerCourse.com  Fact Sheet  for Healthcare Providers: SeriousBroker.it  This test is not yet approved or cleared by the [place] and has been authorized for detection and/or diagnosis of SARS-CoV-2 by FDA under an Emergency Use Authorization (EUA). This EUA will remain in effect (meaning this test can be used) for the duration of the COVID-19 declaration under Section 564(b)(1) of the Act, 21 U.S.C. section 360bbb-3(b)(1), unless the authorization is terminated or revoked.     Resp Syncytial Virus by PCR NEGATIVE NEGATIVE Final    Comment: (NOTE) Fact Sheet for Patients: BloggerCourse.com  Fact Sheet for Healthcare Providers: SeriousBroker.it  This test is not yet approved or cleared by the [place] FDA and has been authorized for detection and/or diagnosis of SARS-CoV-2 by FDA under an Emergency Use Authorization (EUA). This EUA will remain in effect (meaning this test can be used) for the duration of the COVID-19 declaration under Section 564(b)(1) of the Act, 21 U.S.C. section 360bbb-3(b)(1), unless the authorization is terminated or revoked.  Performed at [facility], [address] [place], [place] [address]   Respiratory (~20 pathogens) panel by PCR     Status: None   Collection Time: 04/14/23  8:41 PM   Specimen: Nasopharyngeal Swab; Respiratory  Result Value Ref Range Status    Adenovirus NOT DETECTED NOT DETECTED Final   Coronavirus 229E NOT DETECTED NOT DETECTED Final    Comment: (NOTE) The Coronavirus on the Respiratory Panel, DOES NOT test for the novel  Coronavirus (2019 nCoV)    Coronavirus HKU1 NOT DETECTED NOT DETECTED Final   Coronavirus NL63 NOT DETECTED NOT DETECTED Final   Coronavirus OC43 NOT DETECTED NOT DETECTED Final   Metapneumovirus NOT DETECTED NOT DETECTED Final   Rhinovirus / Enterovirus NOT DETECTED NOT DETECTED Final   Influenza A NOT DETECTED NOT DETECTED Final   Influenza B NOT DETECTED NOT DETECTED Final   Parainfluenza Virus 1 NOT DETECTED NOT DETECTED Final   Parainfluenza Virus 2 NOT DETECTED NOT DETECTED Final   Parainfluenza Virus 3 NOT DETECTED NOT DETECTED Final   Parainfluenza Virus 4 NOT DETECTED NOT DETECTED Final   Respiratory Syncytial Virus NOT DETECTED NOT DETECTED Final   Bordetella pertussis NOT DETECTED NOT DETECTED Final   Bordetella Parapertussis NOT DETECTED NOT DETECTED Final   Chlamydophila pneumoniae NOT DETECTED NOT DETECTED Final   Mycoplasma pneumoniae NOT DETECTED NOT DETECTED Final    Comment: Performed at [facility], [address] [place], [place] [address]  Group A Strep by PCR     Status: None   Collection Time: 04/14/23  9:06 PM   Specimen: Throat; Sterile Swab  Result Value Ref Range Status   Group A Strep by PCR NOT DETECTED NOT DETECTED Final    Comment: Performed at [facility], [address], [place], [place] [address]   Time coordinating discharge: 35 minutes  SIGNED: Lanae Boast, MD  [facility] 04/16/2023, 4:12 PM  If 7PM-7AM, please contact night-coverage [email or web address]

## 2023-04-16 NOTE — Progress Notes (Signed)
Report given to [facility] transport.

## 2023-04-17 ENCOUNTER — Telehealth: Payer: Self-pay | Admitting: Family Medicine

## 2023-04-17 DIAGNOSIS — R7881 Bacteremia: Secondary | ICD-10-CM | POA: Diagnosis not present

## 2023-04-17 LAB — CULTURE, BLOOD (ROUTINE X 2): Special Requests: ADEQUATE

## 2023-04-17 NOTE — Telephone Encounter (Signed)
[facility] Pharmacist called and requested if we could fax them a report of the patient's medication list to [phone]. She mentioned that she is unable to see this via Epic. Please advise.

## 2023-04-17 NOTE — Telephone Encounter (Signed)
Med list faxed

## 2023-04-18 DIAGNOSIS — R7881 Bacteremia: Secondary | ICD-10-CM | POA: Diagnosis not present

## 2023-04-19 DIAGNOSIS — R7881 Bacteremia: Secondary | ICD-10-CM | POA: Diagnosis not present

## 2023-04-21 LAB — CULTURE, BLOOD (ROUTINE X 2)
Culture: NO GROWTH
Culture: NO GROWTH

## 2023-04-22 ENCOUNTER — Ambulatory Visit: Payer: Medicare Other | Admitting: Cardiology

## 2023-04-23 ENCOUNTER — Ambulatory Visit (HOSPITAL_BASED_OUTPATIENT_CLINIC_OR_DEPARTMENT_OTHER): Payer: Medicare Other | Admitting: General Surgery

## 2023-04-23 DIAGNOSIS — C859A Non-Hodgkin lymphoma, unspecified, in remission: Secondary | ICD-10-CM | POA: Diagnosis not present

## 2023-04-23 DIAGNOSIS — B952 Enterococcus as the cause of diseases classified elsewhere: Secondary | ICD-10-CM | POA: Diagnosis not present

## 2023-04-23 DIAGNOSIS — I442 Atrioventricular block, complete: Secondary | ICD-10-CM | POA: Diagnosis not present

## 2023-04-23 DIAGNOSIS — I480 Paroxysmal atrial fibrillation: Secondary | ICD-10-CM | POA: Diagnosis not present

## 2023-04-23 DIAGNOSIS — E1165 Type 2 diabetes mellitus with hyperglycemia: Secondary | ICD-10-CM | POA: Diagnosis not present

## 2023-04-23 DIAGNOSIS — Z9989 Dependence on other enabling machines and devices: Secondary | ICD-10-CM | POA: Diagnosis not present

## 2023-04-23 DIAGNOSIS — Z7901 Long term (current) use of anticoagulants: Secondary | ICD-10-CM | POA: Diagnosis not present

## 2023-04-23 DIAGNOSIS — T827XXA Infection and inflammatory reaction due to other cardiac and vascular devices, implants and grafts, initial encounter: Secondary | ICD-10-CM | POA: Diagnosis not present

## 2023-04-23 DIAGNOSIS — G4733 Obstructive sleep apnea (adult) (pediatric): Secondary | ICD-10-CM | POA: Diagnosis not present

## 2023-04-23 DIAGNOSIS — I4892 Unspecified atrial flutter: Secondary | ICD-10-CM | POA: Diagnosis not present

## 2023-04-23 DIAGNOSIS — R7881 Bacteremia: Secondary | ICD-10-CM | POA: Diagnosis not present

## 2023-04-28 ENCOUNTER — Telehealth: Payer: Self-pay | Admitting: Pharmacist

## 2023-04-28 ENCOUNTER — Ambulatory Visit: Payer: Medicare Other | Admitting: Pharmacist

## 2023-04-28 DIAGNOSIS — E1169 Type 2 diabetes mellitus with other specified complication: Secondary | ICD-10-CM

## 2023-04-28 DIAGNOSIS — B952 Enterococcus as the cause of diseases classified elsewhere: Secondary | ICD-10-CM | POA: Diagnosis not present

## 2023-04-28 DIAGNOSIS — I129 Hypertensive chronic kidney disease with stage 1 through stage 4 chronic kidney disease, or unspecified chronic kidney disease: Secondary | ICD-10-CM

## 2023-04-28 DIAGNOSIS — I5042 Chronic combined systolic (congestive) and diastolic (congestive) heart failure: Secondary | ICD-10-CM

## 2023-04-28 DIAGNOSIS — R7881 Bacteremia: Secondary | ICD-10-CM | POA: Diagnosis not present

## 2023-04-28 NOTE — Telephone Encounter (Signed)
Opened in error

## 2023-04-28 NOTE — Progress Notes (Signed)
Pharmacy Note  04/28/2023 Name: Richard [name] MRN: [id number] DOB: 1940-11-16  Subjective: Richard [name] is a 82 y.o. year old male who is a primary care patient of Zola Button, Grayling Congress, DO. Clinical Pharmacist Practitioner referral was placed to assist with medication and diabetes management.    Engaged with patient by telephone for follow up visit today.  Patient had a pacemaker change 04/01/2023. Unfortunately he subsequently developed infection / sepsis. He spent 10 days at [facility]. He is now at [facility] for rehab and to receive daily IV antibiotic therapy . Per patient he will take prophylactic daily antibiotic when finished indefinitely.   Type 2 DM -  Current therapy - Trulicity 3mg  weekly, glimepiride 2mg  twice a day and Farxiga 10mg  daily.  Past therapies: metformin stopped due to rising Scr.  Initially recommended Ozempic instead of Trulicity but Trulicity was preferred by patient's insurance.   Tolerating Trulicity well.  Denies any hypoglycemia events since blood glucose was in the 50's when during the time he had infection.   Wt Readings from Last 3 Encounters:  04/14/23 231 lb 7.7 oz (105 kg)  04/11/23 233 lb (105.7 kg)  04/10/23 232 lb 6.4 oz (105.4 kg)   Weight 1 year ago 10/26/2021 was 251lbs.  (Has lost about 20 lbs since starting GLP1  Diet - provided by rehab facility - [facility]  Exercise - none currently, only rehab  CHF:  He takes an extra torsemide if needed for weight gain of more than 2 - 3 lbs in 24 hours or 5lbs in a week.  Reports he takes an extra torsemide about 2 times per week. He is concerned about running out of torsemide early.  Current medications: torsemide 20mg   - 2 tabs = 40mg  daily, Farxiga 10mg  daily (for DM, CHF and CKD)  Medication management:  Current therapy: fenofibrate 160mg , atorvastatin 80mg  daiyl, ezetimibe 10mg  daily and icosappent ethyl - 2 grams twice a day. Enrolled in Hyperlipidemia and CHF / Cardiomyopathy  [employer] funds which covers all his cholesterol medications and heart medications.  [facility] states that [facility] is [facility] filling [facility] but [facility] is supplying all his meds at this time.  Objective: Review of patient status, including review of consultants reports, laboratory and other test data, was performed as part of comprehensive.  Lab Results  Component Value Date   CREATININE 1.49 (H) 04/16/2023   CREATININE 1.44 (H) 04/15/2023   CREATININE 1.55 (H) 04/14/2023    Lab Results  Component Value Date   HGBA1C 9.1 (H) 04/10/2023       Component Value Date/Time   CHOL 182 04/10/2023 1020   CHOL 162 11/23/2020 0828   TRIG 347.0 (H) 04/10/2023 1020   HDL 32.50 (L) 04/10/2023 1020   HDL 38 (L) 11/23/2020 0828   CHOLHDL 6 04/10/2023 1020   VLDL 69.4 (H) 04/10/2023 1020   LDLCALC 80 04/10/2023 1020   LDLCALC 89 11/23/2020 0828   LDLCALC  04/18/2020 0956     Comment:     . LDL cholesterol not calculated. Triglyceride levels greater than 400 mg/dL invalidate calculated LDL results. . Reference range: <100 . Desirable range <100 mg/dL for primary prevention;   <70 mg/dL for patients with CHD or diabetic patients  with > or = 2 CHD risk factors. [name] LDL-C is now calculated using the Martin-Hopkins  calculation, which is a validated novel method providing  better accuracy than the Friedewald equation in the  estimation of LDL-C.  Horald Pollen et  al. JAMA. 4696;295(28): 2061-2068  (http://education.[facility].com/faq/FAQ164)    LDLDIRECT 78.0 11/26/2022 1006     Clinical ASCVD: Yes  The ASCVD Risk score (Arnett DK, et al., 2019) failed to calculate for the following reasons:   The 2019 ASCVD risk score is only valid for ages 78 to 39   The patient has a prior MI or stroke diagnosis    BP Readings from Last 3 Encounters:  04/16/23 126/82  04/11/23 130/72  04/10/23 130/78     Allergies  Allergen Reactions   Ace Inhibitors Swelling and Other (See  Comments)    Angioedema    Benazepril Swelling and Other (See Comments)    Angioedema; he is not a candidate for any angiotensin receptor blockers because of this significant allergic reaction. Because of a history of documented adverse serious drug reaction;Medi Alert bracelet  is recommended   Entresto [Sacubitril-Valsartan] Swelling and Other (See Comments)    First-in-Class Angiotensin Receptor Neprilysin Inhibitor- Med was "red-flagged" by the patient's pharmacy for him to NOT take!   Hctz [Hydrochlorothiazide] Anaphylaxis and Swelling    Tongue and lip swelling    Aspirin Other (See Comments)    Gastritis, can aspirin not take 325 mg aspirin    Medications Reviewed Today     Reviewed by Henrene Pastor, RPH-CPP (Pharmacist) on 04/28/23 at 1445  Med List Status: <None>   Medication Order Taking? Sig Documenting Provider Last Dose Status Informant  acetaminophen (TYLENOL) 325 MG tablet [id number] No Take 650 mg by mouth at bedtime as needed for moderate pain or headache. [provider] unknown Active Self  allopurinol (ZYLOPRIM) 300 MG tablet [id number] No Take 450 mg by mouth daily. [provider] 04/14/2023 Active Self           Med Note Antony Madura, Arn Medal   Wed Jan 16, 2022  2:57 PM) 1.5 tablets = 450 mg  aluminum hydroxide-magnesium carbonate (GAVISCON) 95-358 MG/15ML SUSP [id number] No Take 15 mLs by mouth as needed for indigestion or heartburn. [provider] unknown Active Self  ampicillin 2 g in sodium chloride 0.9 % 100 mL [id number]  Inject 2 g into the vein every 6 (six) hours. Lanae Boast, MD  Active   apixaban (ELIQUIS) 2.5 MG TABS tablet [id number] No TAKE 1 TABLET BY MOUTH TWICE A Kenn File, MD 04/14/2023 0900 Active Self  Ascorbic Acid (VITAMIN C PO) [id number] No Take 1 tablet by mouth daily with breakfast. [provider] 04/14/2023 Active Self  atorvastatin (LIPITOR) 80 MG tablet [id number] No TAKE 1 TABLET BY MOUTH EVERYDAY AT  BEDTIME Rollene Rotunda, MD 04/13/2023 Active Self  Azelastine HCl 137 MCG/SPRAY SOLN [id number] No PLACE 2 SPRAYS INTO BOTH NOSTRILS AT BEDTIME AS NEEDED FOR RHINITIS OR ALLERGIES. Donato Schultz, DO Past Week Active Self  cefTRIAXone 2 g in sodium chloride 0.9 % 100 mL [id number]  Inject 2 g into the vein every 12 (twelve) hours. Lanae Boast, MD  Active   Coenzyme Q10 (CO Q-10 PO) [id number] No Take 1 capsule by mouth daily. [provider] 04/14/2023 Active Self  doxycycline (VIBRA-TABS) 100 MG tablet [id number] No Take 1 tablet (100 mg total) by mouth 2 (two) times daily. Olive Bass, FNP 04/14/2023 Active Self           Med Note Laney Potash Apr 14, 2023  9:44 PM) ABT Start Date 04/11/23  Dulaglutide (TRULICITY) 3 MG/0.5ML SOPN [id number] No Inject 3 mg into the skin once a week. Lowne  Irish Elders, DO 04/11/2023 Active Self  esomeprazole (NEXIUM) 40 MG capsule [id number] No Take 1 capsule (40 mg total) by mouth daily.  Patient taking differently: Take 40 mg by mouth daily before breakfast.   Pecola Lawless, MD 04/14/2023 Active Self  ezetimibe (ZETIA) 10 MG tablet [id number] No Take 1 tablet (10 mg total) by mouth daily. Seabron Spates R, DO 04/14/2023 Active Self  FARXIGA 10 MG TABS tablet [id number] No Take 10 mg by mouth in the morning. [provider] 04/14/2023 Active Self  fenofibrate 160 MG tablet [id number] No Take 1 tablet (160 mg total) by mouth daily. Zola Button, Myrene Buddy R, DO 04/14/2023 Active Self  folic acid (FOLVITE) 1 MG tablet [id number] No TAKE 2 TABLETS BY MOUTH EVERY DAY Josph Macho, MD 04/14/2023 Active Self  glimepiride (AMARYL) 2 MG tablet [id number] No Take 1 tablet (2 mg total) by mouth in the morning and at bedtime. Zola Button, Myrene Buddy R, DO 04/14/2023 Active Self  glucose blood (FREESTYLE LITE) test strip [id number] No USE TO TEST BLOOD SUGAR ONCE A DAY. DX CODE: E11.9 Donato Schultz, DO Taking Active Self   icosapent Ethyl (VASCEPA) 1 g capsule [id number] No Take 2 capsules (2 g total) by mouth 2 (two) times daily. Zola Button, Grayling Congress, DO 04/14/2023 Active Self  KLOR-CON M20 20 MEQ tablet [id number] No TAKE 2 TABLETS BY MOUTH DAILY Rollene Rotunda, MD 04/13/2023 Active Self  Lancets (FREESTYLE) lancets [id number] No USE ONCE A DAY TO CHECK BLOOD SUGAR. Donato Schultz, DO Taking Active Self  Menthol-Camphor (ICY HOT ADVANCED PAIN RELIEF EX) [id number] No Apply 1 application  topically daily as needed (to painful sites). [provider] unknown Active Self  metoprolol succinate (TOPROL-XL) 25 MG 24 hr tablet [id number] No TAKE 1 TABLET (25 MG TOTAL) BY MOUTH DAILY. Rollene Rotunda, MD 04/13/2023 Active Self  nitroGLYCERIN (NITROSTAT) 0.4 MG SL tablet [id number] No PLACE 1 TABLET UNDER THE TONGUE EVERY 5 MINUTES AS NEEDED FOR CHEST PAIN. Rollene Rotunda, MD unknown Active Self  pregabalin (LYRICA) 100 MG capsule [id number] No Take 1 capsule (100 mg total) by mouth daily. At noon [facility], Orange Blossom R, DO 04/14/2023 Active Self  pregabalin (LYRICA) 150 MG capsule [id number] No Take 1 capsule (150 mg total) by mouth at bedtime. (Take 100mg  daily at noon) Zola Button, Grayling Congress, DO 04/13/2023 Active Self  psyllium (METAMUCIL) 58.6 % powder [id number] No Take 1 packet by mouth daily as needed (for constipation- mix and drink). [provider] Past Week Active Self  senna (SENOKOT) 8.6 MG TABS tablet [id number] No Take 2 tablets (17.2 mg total) by mouth at bedtime. This is for constipation.  Stop taking if you start having diarrhea.  Patient not taking: Reported on 04/14/2023   Elease Etienne, MD Not Taking Active Self  torsemide (DEMADEX) 20 MG tablet [id number] No Take 2 tablets (40 mg total) by mouth in the morning. Take extra tablet for weight gain more then 2 to 3 lbs in 24 hours. Seabron Spates R, DO 04/14/2023 Active Self  Med List Note Early Osmond, Tiffany, CPhT 06/10/13 1017): cpap  machine 2 L of oxygen at night            Patient Active Problem List   Diagnosis Date Noted   Sepsis (HCC) 04/15/2023   Permanent atrial fibrillation (HCC) 10/23/2022   Simple chronic bronchitis (HCC) 06/03/2022   Cellulitis of lower extremity 04/30/2022   Type 2 DM with CKD stage 3  and hypertension (HCC) 04/30/2022   Cellulitis of right leg 04/24/2022   Stage 3b chronic kidney disease (CKD) (HCC) 04/24/2022   Left upper quadrant pain 04/08/2022   Subacute cough 04/08/2022   Chronic combined systolic and diastolic heart failure (HCC) 02/06/2022   Acute exacerbation of CHF (congestive heart failure) (HCC) 01/16/2022   Hemoptysis 12/26/2021   Subclavian vein stenosis 12/23/2021   Strep throat 11/16/2021   Multiple superficial wounds with infection 01/04/2021   Cardiac pacemaker in situ 11/20/2020   S/P CABG x 4 11/20/2020   Ascending aortic aneurysm (HCC) 11/20/2020   Senile purpura (HCC) 07/27/2020   Porokeratosis 06/20/2020   Pain due to onychomycosis of toenails of both feet 12/14/2019   Educated about COVID-19 virus infection 11/25/2019   DM2 (diabetes mellitus, type 2) (HCC) 08/10/2019   Hyperlipidemia associated with type 2 diabetes mellitus (HCC) 08/10/2019   S/P left TKA 06/29/2019   Peripheral neuropathy 07/27/2018   Uncontrolled type 2 diabetes mellitus with hyperglycemia (HCC) 05/13/2018   Diabetic peripheral neuropathy associated with type 2 diabetes mellitus (HCC) 05/13/2018   Pansinusitis 05/13/2018   Severe aortic stenosis    Complete heart block (HCC)    Paroxysmal atrial fibrillation (HCC)    Acute on chronic diastolic CHF (congestive heart failure), NYHA class 2 (HCC)    Left arm swelling    Status post transcatheter aortic valve replacement (TAVR) using bioprosthesis 09/09/2017   Demand ischemia of myocardium (HCC)    Typical atrial flutter (HCC)    Acute on chronic diastolic heart failure (HCC)    GIB (gastrointestinal bleeding) 08/16/2017    Hyperlipidemia 08/05/2017   NSTEMI (non-ST elevated myocardial infarction) (HCC)    Aortic stenosis, severe 07/25/2017   Pancytopenia (HCC) 07/24/2017   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) 02/27/2017   Bradycardia 01/04/2017   Coronary artery disease 01/04/2017   Stenosis of carotid artery 01/04/2017   Obesity 09/18/2016   Wenckebach block    Disorder associated with type II diabetes mellitus (HCC) 05/21/2013   Spinal stenosis of lumbar region 04/08/2013   Anemia, iron deficiency 11/29/2011   B12 deficiency anemia 07/30/2011   OSA (obstructive sleep apnea) 07/06/2010   CAD, ARTERY BYPASS GRAFT 08/22/2009   Essential hypertension 03/29/2009   Bilateral lower extremity edema 02/21/2009   Hyperlipidemia LDL goal <70 11/26/2008   GOUT 08/17/2006     Medication Assistance:   High med cost when in coverage gap. Screened for medication assistance program in past - patient did not qualify. [employer] for hypercholesterolemia. Approved for $2500 thru 06/03/2023  Patient reports he has gotten out of Medicare coverage gap and is now in catastrophic coverage phase. Medications are $0 to $10.   Assessment / Plan: Type 2 DM - A1c now 7.1% Continue Trulicity 3mg  weekly, glimepiride 2mg  twice a day and Farxiga 10mg  daily.  Continue to check blood glucose once a day Reviewed s/s of hypoglycemia and how to treat.   CHF:  Continue to take extra torsemide as needed.  Continue to take [place].   Medication management:  Reviewed and updated medication list  Reviewed refill history and adherence [facility].     Follow Up:  Telephone follow up appointment with care management team member scheduled for:  1 to 2 months   Henrene Pastor, PharmD Clinical Pharmacist [facility]  - [facility] [phone]

## 2023-04-29 ENCOUNTER — Ambulatory Visit: Payer: Medicare Other | Admitting: Family Medicine

## 2023-04-30 ENCOUNTER — Encounter (HOSPITAL_BASED_OUTPATIENT_CLINIC_OR_DEPARTMENT_OTHER): Payer: Medicare Other | Admitting: General Surgery

## 2023-05-06 DIAGNOSIS — Z09 Encounter for follow-up examination after completed treatment for conditions other than malignant neoplasm: Secondary | ICD-10-CM | POA: Diagnosis not present

## 2023-05-06 DIAGNOSIS — A498 Other bacterial infections of unspecified site: Secondary | ICD-10-CM | POA: Diagnosis not present

## 2023-05-06 DIAGNOSIS — Z95828 Presence of other vascular implants and grafts: Secondary | ICD-10-CM | POA: Diagnosis not present

## 2023-05-06 DIAGNOSIS — Z950 Presence of cardiac pacemaker: Secondary | ICD-10-CM | POA: Diagnosis not present

## 2023-05-06 DIAGNOSIS — Z952 Presence of prosthetic heart valve: Secondary | ICD-10-CM | POA: Diagnosis not present

## 2023-05-06 DIAGNOSIS — Z452 Encounter for adjustment and management of vascular access device: Secondary | ICD-10-CM | POA: Diagnosis not present

## 2023-05-06 DIAGNOSIS — B952 Enterococcus as the cause of diseases classified elsewhere: Secondary | ICD-10-CM | POA: Diagnosis not present

## 2023-05-06 DIAGNOSIS — R7881 Bacteremia: Secondary | ICD-10-CM | POA: Diagnosis not present

## 2023-05-06 DIAGNOSIS — R7989 Other specified abnormal findings of blood chemistry: Secondary | ICD-10-CM | POA: Diagnosis not present

## 2023-05-07 DIAGNOSIS — R7881 Bacteremia: Secondary | ICD-10-CM | POA: Diagnosis not present

## 2023-05-08 ENCOUNTER — Inpatient Hospital Stay: Payer: Medicare Other

## 2023-05-08 ENCOUNTER — Inpatient Hospital Stay: Payer: Medicare Other | Attending: Hematology & Oncology

## 2023-05-08 ENCOUNTER — Encounter: Payer: Self-pay | Admitting: Medical Oncology

## 2023-05-08 ENCOUNTER — Inpatient Hospital Stay: Payer: Medicare Other | Admitting: Medical Oncology

## 2023-05-08 VITALS — BP 108/49 | HR 60 | Temp 98.7°F | Resp 15 | Wt 235.0 lb

## 2023-05-08 DIAGNOSIS — C8332 Diffuse large B-cell lymphoma, intrathoracic lymph nodes: Secondary | ICD-10-CM | POA: Diagnosis not present

## 2023-05-08 DIAGNOSIS — R599 Enlarged lymph nodes, unspecified: Secondary | ICD-10-CM | POA: Diagnosis not present

## 2023-05-08 DIAGNOSIS — D508 Other iron deficiency anemias: Secondary | ICD-10-CM

## 2023-05-08 DIAGNOSIS — D519 Vitamin B12 deficiency anemia, unspecified: Secondary | ICD-10-CM

## 2023-05-08 DIAGNOSIS — Z950 Presence of cardiac pacemaker: Secondary | ICD-10-CM | POA: Insufficient documentation

## 2023-05-08 DIAGNOSIS — D511 Vitamin B12 deficiency anemia due to selective vitamin B12 malabsorption with proteinuria: Secondary | ICD-10-CM | POA: Diagnosis not present

## 2023-05-08 DIAGNOSIS — D510 Vitamin B12 deficiency anemia due to intrinsic factor deficiency: Secondary | ICD-10-CM | POA: Insufficient documentation

## 2023-05-08 DIAGNOSIS — R935 Abnormal findings on diagnostic imaging of other abdominal regions, including retroperitoneum: Secondary | ICD-10-CM | POA: Diagnosis not present

## 2023-05-08 DIAGNOSIS — Z95828 Presence of other vascular implants and grafts: Secondary | ICD-10-CM

## 2023-05-08 DIAGNOSIS — D518 Other vitamin B12 deficiency anemias: Secondary | ICD-10-CM

## 2023-05-08 DIAGNOSIS — E611 Iron deficiency: Secondary | ICD-10-CM | POA: Diagnosis not present

## 2023-05-08 DIAGNOSIS — Z79899 Other long term (current) drug therapy: Secondary | ICD-10-CM | POA: Diagnosis not present

## 2023-05-08 LAB — LACTATE DEHYDROGENASE: LDH: 167 U/L (ref 98–192)

## 2023-05-08 LAB — CBC WITH DIFFERENTIAL (CANCER CENTER ONLY)
Abs Immature Granulocytes: 0.06 10*3/uL (ref 0.00–0.07)
Basophils Absolute: 0.1 10*3/uL (ref 0.0–0.1)
Basophils Relative: 1 %
Eosinophils Absolute: 0.4 10*3/uL (ref 0.0–0.5)
Eosinophils Relative: 5 %
HCT: 40.2 % (ref 39.0–52.0)
Hemoglobin: 13.2 g/dL (ref 13.0–17.0)
Immature Granulocytes: 1 %
Lymphocytes Relative: 18 %
Lymphs Abs: 1.4 10*3/uL (ref 0.7–4.0)
MCH: 29.7 pg (ref 26.0–34.0)
MCHC: 32.8 g/dL (ref 30.0–36.0)
MCV: 90.5 fL (ref 80.0–100.0)
Monocytes Absolute: 0.5 10*3/uL (ref 0.1–1.0)
Monocytes Relative: 6 %
Neutro Abs: 5.4 10*3/uL (ref 1.7–7.7)
Neutrophils Relative %: 69 %
Platelet Count: 202 10*3/uL (ref 150–400)
RBC: 4.44 MIL/uL (ref 4.22–5.81)
RDW: 16.9 % — ABNORMAL HIGH (ref 11.5–15.5)
WBC Count: 7.7 10*3/uL (ref 4.0–10.5)
nRBC: 0 % (ref 0.0–0.2)

## 2023-05-08 LAB — CMP (CANCER CENTER ONLY)
ALT: 15 U/L (ref 0–44)
AST: 18 U/L (ref 15–41)
Albumin: 4.3 g/dL (ref 3.5–5.0)
Alkaline Phosphatase: 51 U/L (ref 38–126)
Anion gap: 9 (ref 5–15)
BUN: 23 mg/dL (ref 8–23)
CO2: 32 mmol/L (ref 22–32)
Calcium: 9.4 mg/dL (ref 8.9–10.3)
Chloride: 100 mmol/L (ref 98–111)
Creatinine: 1.68 mg/dL — ABNORMAL HIGH (ref 0.61–1.24)
GFR, Estimated: 40 mL/min — ABNORMAL LOW (ref >60)
Glucose, Bld: 181 mg/dL — ABNORMAL HIGH (ref 70–99)
Potassium: 3.1 mmol/L — ABNORMAL LOW (ref 3.5–5.1)
Sodium: 141 mmol/L (ref 135–145)
Total Bilirubin: 0.4 mg/dL (ref <1.2)
Total Protein: 6.9 g/dL (ref 6.5–8.1)

## 2023-05-08 LAB — IRON AND IRON BINDING CAPACITY (CC-WL,HP ONLY)
Iron: 81 ug/dL (ref 45–182)
Saturation Ratios: 28 % (ref 17.9–39.5)
TIBC: 290 ug/dL (ref 250–450)
UIBC: 209 ug/dL (ref 117–376)

## 2023-05-08 LAB — FERRITIN: Ferritin: 1424 ng/mL — ABNORMAL HIGH (ref 24–336)

## 2023-05-08 MED ORDER — SODIUM CHLORIDE 0.9% FLUSH: 10.0000 mL | INTRAVENOUS | Status: DC | PRN

## 2023-05-08 MED ORDER — HEPARIN SOD (PORK) LOCK FLUSH 100 UNIT/ML IV SOLN
250.0000 [IU] | Freq: Once | INTRAVENOUS | Status: AC
Start: 2023-05-08 — End: 2023-05-08
  Administered 2023-05-08: 250 [IU] via INTRAVENOUS

## 2023-05-08 MED ORDER — SODIUM CHLORIDE 0.9% FLUSH
10.0000 mL | Freq: Once | INTRAVENOUS | Status: AC
Start: 2023-05-08 — End: 2023-05-08
  Administered 2023-05-08: 10 mL via INTRAVENOUS

## 2023-05-08 MED ORDER — ALTEPLASE 2 MG IJ SOLR: 2.0000 mg | Freq: Once | INTRAMUSCULAR | Status: DC | PRN

## 2023-05-08 MED ORDER — HEPARIN SOD (PORK) LOCK FLUSH 100 UNIT/ML IV SOLN: 500.0000 [IU] | Freq: Once | INTRAVENOUS | Status: DC | PRN

## 2023-05-08 MED ORDER — HEPARIN SOD (PORK) LOCK FLUSH 100 UNIT/ML IV SOLN
250.0000 [IU] | Freq: Once | INTRAVENOUS | Status: AC
  Administered 2023-05-08: 250 [IU] via INTRAVENOUS

## 2023-05-08 MED ORDER — DENOSUMAB 120 MG/1.7ML ~~LOC~~ SOLN
120.0000 mg | Freq: Once | SUBCUTANEOUS | Status: AC
  Administered 2023-05-08: 120 mg via SUBCUTANEOUS
  Filled 2023-05-08: qty 1.7

## 2023-05-08 MED ORDER — CYANOCOBALAMIN 1000 MCG/ML IJ SOLN
1000.0000 ug | Freq: Once | INTRAMUSCULAR | Status: AC
  Administered 2023-05-08: 1000 ug via INTRAMUSCULAR
  Filled 2023-05-08: qty 1

## 2023-05-08 MED ORDER — SODIUM CHLORIDE 0.9% FLUSH
10.0000 mL | Freq: Once | INTRAVENOUS | Status: AC
  Administered 2023-05-08: 10 mL via INTRAVENOUS

## 2023-05-08 NOTE — Progress Notes (Signed)
Hematology and Oncology Follow Up Visit  Richard [name] [id number] 07-27-1940 82 y.o. 05/08/2023   Principle Diagnosis:  Diffuse large cell non-Hodgkin's lymphoma (IPI = 3) - NOT "double hit" Pernicious anemia Iron deficiency secondary to bleeding   Past Therapy: R-CHOP - s/p cycle 8 - completed 08/2017   Current Therapy:        Vitamin B12 1 mg IM every month Xgeva 120 mg subcu q 3 months - next dose due in  05/2023 IV iron as indicated   Interim History:  Richard [name] is here today for follow-up and B 12 injection.   He had a new pacemaker placed on 04/01/2023. Venogram occurred on 03/14/2023. He has an infection that is suspected to be due to his pacemaker replacement. He was septic and spent 2 weeks inpatient at [facility]. While being evaluated for all of this, he had a CT of his chest and abdomen obtained. There was a new left paratracheal and right inguinal lymph node that were shown. In addition a cystic area of his pancreatic tail appeared more prominent compared to prior studies. He was discharged to rehabilitation-Richard [name].  Today he reports that he is feeling well and doing well all things considered. He is happy at [facility].   No dental concerns or upcoming dental procedures He has not noted any blood loss. No petechiae.  No fever, chills, n/v, cough, rash, dizziness, SOB, chest pain, palpitations, abdominal pain or changes in bowel or bladder habits.  No falls or syncope.  Appetite and hydration are good.  Wt Readings from Last 3 Encounters:  05/08/23 235 lb 0.6 oz (106.6 kg)  04/14/23 231 lb 7.7 oz (105 kg)  04/11/23 233 lb (105.7 kg)   ECOG Performance Status: 1 - Symptomatic but completely ambulatory  Medications:  Allergies as of 05/08/2023       Reactions   Ace Inhibitors Swelling, Other (See Comments)   Angioedema   Benazepril Swelling, Other (See Comments)   Angioedema; he is not a candidate for any angiotensin receptor blockers because of this  significant allergic reaction. Because of a history of documented adverse serious drug reaction;Medi Alert bracelet  is recommended   Entresto [sacubitril-valsartan] Swelling, Other (See Comments)   First-in-Class Angiotensin Receptor Neprilysin Inhibitor- Med was "red-flagged" by the patient's pharmacy for him to NOT take!   Hctz [hydrochlorothiazide] Anaphylaxis, Swelling   Tongue and lip swelling   Aspirin Other (See Comments)   Gastritis, can aspirin not take 325 mg aspirin        Medication List        Accurate as of May 08, 2023 11:36 AM. If you have any questions, ask your nurse or doctor.          acetaminophen 325 MG tablet Commonly known as: TYLENOL Take 650 mg by mouth at bedtime as needed for moderate pain or headache.   acetaminophen 500 MG tablet Commonly known as: TYLENOL Take 1,000 mg by mouth 2 (two) times daily as needed.   allopurinol 300 MG tablet Commonly known as: ZYLOPRIM Take 300 mg by mouth daily.   aluminum hydroxide-magnesium carbonate 95-358 MG/15ML Susp Commonly known as: GAVISCON Take 15 mLs by mouth as needed for indigestion or heartburn.   amoxicillin 500 MG capsule Commonly known as: AMOXIL Take 500 mg by mouth 3 (three) times daily.   ampicillin 2 g in sodium chloride 0.9 % 100 mL Inject 2 g into the vein every 6 (six) hours.   atorvastatin 80 MG tablet  Commonly known as: LIPITOR TAKE 1 TABLET BY MOUTH EVERYDAY AT BEDTIME   Azelastine HCl 137 MCG/SPRAY Soln PLACE 2 SPRAYS INTO BOTH NOSTRILS AT BEDTIME AS NEEDED FOR RHINITIS OR ALLERGIES.   cefTRIAXone 2 g in sodium chloride 0.9 % 100 mL Inject 2 g into the vein every 12 (twelve) hours.   cefTRIAXone 2 g injection Commonly known as: ROCEPHIN Inject 2 g into the muscle every 12 (twelve) hours.   CO Q-10 PO Take 100 mg by mouth daily.   doxycycline 100 MG tablet Commonly known as: VIBRA-TABS Take 1 tablet (100 mg total) by mouth 2 (two) times daily.   Eliquis 2.5 MG  Tabs tablet Generic drug: apixaban TAKE 1 TABLET BY MOUTH TWICE A DAY   esomeprazole 40 MG capsule Commonly known as: NexIUM Take 1 capsule (40 mg total) by mouth daily.   ezetimibe 10 MG tablet Commonly known as: ZETIA Take 1 tablet (10 mg total) by mouth daily.   Farxiga 10 MG Tabs tablet Generic drug: dapagliflozin propanediol Take 10 mg by mouth in the morning.   fenofibrate 160 MG tablet Take 1 tablet (160 mg total) by mouth daily.   folic acid 1 MG tablet Commonly known as: FOLVITE TAKE 2 TABLETS BY MOUTH EVERY DAY   freestyle lancets USE ONCE A DAY TO CHECK BLOOD SUGAR.   FREESTYLE LITE test strip Generic drug: glucose blood USE TO TEST BLOOD SUGAR ONCE A DAY. DX CODE: E11.9   gabapentin 100 MG capsule Commonly known as: NEURONTIN Take 100 mg by mouth at bedtime. PRN   glimepiride 2 MG tablet Commonly known as: AMARYL Take 1 tablet (2 mg total) by mouth in the morning and at bedtime.   icosapent Ethyl 1 g capsule Commonly known as: Vascepa Take 2 capsules (2 g total) by mouth 2 (two) times daily.   ICY HOT ADVANCED PAIN RELIEF EX Apply 1 application  topically daily as needed (to painful sites).   Klor-Con M20 20 MEQ tablet Generic drug: potassium chloride SA TAKE 2 TABLETS BY MOUTH DAILY   metoprolol succinate 25 MG 24 hr tablet Commonly known as: TOPROL-XL TAKE 1 TABLET (25 MG TOTAL) BY MOUTH DAILY.   nitroGLYCERIN 0.4 MG SL tablet Commonly known as: NITROSTAT PLACE 1 TABLET UNDER THE TONGUE EVERY 5 MINUTES AS NEEDED FOR CHEST PAIN.   omeprazole 20 MG capsule Commonly known as: PRILOSEC Take 20 mg by mouth daily.   pregabalin 100 MG capsule Commonly known as: Lyrica Take 1 capsule (100 mg total) by mouth daily. At noon   pregabalin 150 MG capsule Commonly known as: LYRICA Take 1 capsule (150 mg total) by mouth at bedtime. (Take 100mg  daily at noon)   psyllium 58.6 % powder Commonly known as: METAMUCIL Take 1 packet by mouth daily as  needed (for constipation- mix and drink).   senna 8.6 MG Tabs tablet Commonly known as: SENOKOT Take 2 tablets (17.2 mg total) by mouth at bedtime. This is for constipation.  Stop taking if you start having diarrhea.   torsemide 20 MG tablet Commonly known as: DEMADEX Take 2 tablets (40 mg total) by mouth in the morning. Take extra tablet for weight gain more then 2 to 3 lbs in 24 hours.   Trulicity 3 MG/0.5ML Soaj Generic drug: Dulaglutide Inject 3 mg into the skin once a week.   VITAMIN C PO Take 1 tablet by mouth daily with breakfast.        Allergies:  Allergies  Allergen Reactions  Ace Inhibitors Swelling and Other (See Comments)    Angioedema    Benazepril Swelling and Other (See Comments)    Angioedema; he is not a candidate for any angiotensin receptor blockers because of this significant allergic reaction. Because of a history of documented adverse serious drug reaction;Medi Alert bracelet  is recommended   Entresto [Sacubitril-Valsartan] Swelling and Other (See Comments)    First-in-Class Angiotensin Receptor Neprilysin Inhibitor- Med was "red-flagged" by the patient's pharmacy for him to NOT take!   Hctz [Hydrochlorothiazide] Anaphylaxis and Swelling    Tongue and lip swelling    Aspirin Other (See Comments)    Gastritis, can aspirin not take 325 mg aspirin    Past Medical History, Surgical history, Social history, and Family History were reviewed and updated.  Review of Systems: All other 10 point review of systems is negative.   Physical Exam:  weight is 235 lb 0.6 oz (106.6 kg). His oral temperature is 98.7 F (37.1 C). His blood pressure is 108/49 (abnormal) and his pulse is 60. His respiration is 15 and oxygen saturation is 100%.   Wt Readings from Last 3 Encounters:  05/08/23 235 lb 0.6 oz (106.6 kg)  04/14/23 231 lb 7.7 oz (105 kg)  04/11/23 233 lb (105.7 kg)    Ocular: Sclerae unicteric, pupils equal, round and reactive to  light Ear-nose-throat: Oropharynx clear, dentition fair Lymphatic: No cervical or supraclavicular adenopathy Lungs no rales or rhonchi, good excursion bilaterally Heart regular rate and rhythm, no murmur appreciated Abd soft, nontender, positive bowel sounds MSK no focal spinal tenderness, no joint edema Neuro: non-focal, well-oriented, appropriate affect   Lab Results  Component Value Date   WBC 7.7 05/08/2023   HGB 13.2 05/08/2023   HCT 40.2 05/08/2023   MCV 90.5 05/08/2023   PLT 202 05/08/2023   Lab Results  Component Value Date   FERRITIN 1,756 (H) 04/07/2023   IRON 84 04/07/2023   TIBC 367 04/07/2023   UIBC 283 04/07/2023   IRONPCTSAT 23 04/07/2023   Lab Results  Component Value Date   RETICCTPCT 1.7 04/07/2023   RBC 4.44 05/08/2023   RETICCTABS 52.1 11/22/2011   No results found for: "KPAFRELGTCHN", "LAMBDASER", "[facility]" Lab Results  Component Value Date   IGA 159 03/09/2012   Lab Results  Component Value Date   ALBUMINELP 4.2 07/27/2018   MSPIKE Not Observed 07/27/2018     Chemistry      Component Value Date/Time   NA 135 04/16/2023 0439   NA 138 02/08/2022 0811   NA 144 06/27/2017 0857   K 3.4 (L) 04/16/2023 0439   K 3.9 06/27/2017 0857   CL 102 04/16/2023 0439   CL 103 06/27/2017 0857   CO2 21 (L) 04/16/2023 0439   CO2 26 06/27/2017 0857   BUN 28 (H) 04/16/2023 0439   BUN 46 (H) 02/08/2022 0811   BUN 12 06/27/2017 0857   CREATININE 1.49 (H) 04/16/2023 0439   CREATININE 1.66 (H) 04/07/2023 1258   CREATININE 1.65 (H) 04/18/2020 0956      Component Value Date/Time   CALCIUM 8.2 (L) 04/16/2023 0439   CALCIUM 9.2 06/27/2017 0857   ALKPHOS 60 04/14/2023 1915   ALKPHOS 128 (H) 06/27/2017 0857   AST 40 04/14/2023 1915   AST 21 04/07/2023 1258   ALT 36 04/14/2023 1915   ALT 19 04/07/2023 1258   ALT 24 06/27/2017 0857   BILITOT 0.8 04/14/2023 1915   BILITOT 0.6 04/07/2023 1258      Encounter Diagnoses  Name Primary?   Iron deficiency  anemia secondary to inadequate dietary iron intake Yes   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)    Impression and Plan: Mr. Dimmick is a very pleasant 82 yo caucasian gentleman with diffuse large B-cell lymphoma (not "double hit" lymphoma). He completed treatment in February 2019. He is now on Xgeva every 3 months along with PRN IV iron and IM B12.   Discussed that his potassium was down a bit to 3.1- he is not sure if he is getting his K supplement daily so he will ask and I have written it on his papers.  In terms of his CT findings I have discussed it with Dr. Myna Hidalgo and we have recommended further work up. Imaging orders pending- PET  Iron studies pending. Will replace if needed B 12 given today.  Xgeva due today RTC 1 month MD, labs (CBC w/, CMP, iron, ferritin, LDH, B12), B12  Richard Chestnut, PA-C 11/7/202411:36 AM

## 2023-05-08 NOTE — Patient Instructions (Signed)

## 2023-05-08 NOTE — Patient Instructions (Signed)
Denosumab Injection (Oncology) What is this medication? DENOSUMAB (den oh SUE mab) prevents weakened bones caused by cancer. It may also be used to treat noncancerous bone tumors that cannot be removed by surgery. It can also be used to treat high calcium levels in the blood caused by cancer. It works by blocking a protein that causes bones to break down quickly. This slows down the release of calcium from bones, which lowers calcium levels in your blood. It also makes your bones stronger and less likely to break (fracture). This medicine may be used for other purposes; ask your health care provider or pharmacist if you have questions. COMMON BRAND NAME(S): XGEVA What should I tell my care team before I take this medication? They need to know if you have any of these conditions: Dental disease Having surgery or tooth extraction Infection Kidney disease Low levels of calcium or vitamin D in the blood Malnutrition On hemodialysis Skin conditions or sensitivity Thyroid or parathyroid disease An unusual reaction to denosumab, other medications, foods, dyes, or preservatives Pregnant or trying to get pregnant Breast-feeding How should I use this medication? This medication is for injection under the skin. It is given by your care team in a hospital or clinic setting. A special MedGuide will be given to you before each treatment. Be sure to read this information carefully each time. Talk to your care team about the use of this medication in children. While it may be prescribed for children as young as 13 years for selected conditions, precautions do apply. Overdosage: If you think you have taken too much of this medicine contact a poison control center or emergency room at once. NOTE: This medicine is only for you. Do not share this medicine with others. What if I miss a dose? Keep appointments for follow-up doses. It is important not to miss your dose. Call your care team if you are unable to  keep an appointment. What may interact with this medication? Do not take this medication with any of the following: Other medications containing denosumab This medication may also interact with the following: Medications that lower your chance of fighting infection Steroid medications, such as prednisone or cortisone This list may not describe all possible interactions. Give your health care provider a list of all the medicines, herbs, non-prescription drugs, or dietary supplements you use. Also tell them if you smoke, drink alcohol, or use illegal drugs. Some items may interact with your medicine. What should I watch for while using this medication? Your condition will be monitored carefully while you are receiving this medication. You may need blood work while taking this medication. This medication may increase your risk of getting an infection. Call your care team for advice if you get a fever, chills, sore throat, or other symptoms of a cold or flu. Do not treat yourself. Try to avoid being around people who are sick. You should make sure you get enough calcium and vitamin D while you are taking this medication, unless your care team tells you not to. Discuss the foods you eat and the vitamins you take with your care team. Some people who take this medication have severe bone, joint, or muscle pain. This medication may also increase your risk for jaw problems or a broken thigh bone. Tell your care team right away if you have severe pain in your jaw, bones, joints, or muscles. Tell your care team if you have any pain that does not go away or that gets worse. Talk   to your care team if you may be pregnant. Serious birth defects can occur if you take this medication during pregnancy and for 5 months after the last dose. You will need a negative pregnancy test before starting this medication. Contraception is recommended while taking this medication and for 5 months after the last dose. Your care team  can help you find the option that works for you. What side effects may I notice from receiving this medication? Side effects that you should report to your care team as soon as possible: Allergic reactions--skin rash, itching, hives, swelling of the face, lips, tongue, or throat Bone, joint, or muscle pain Low calcium level--muscle pain or cramps, confusion, tingling, or numbness in the hands or feet Osteonecrosis of the jaw--pain, swelling, or redness in the mouth, numbness of the jaw, poor healing after dental work, unusual discharge from the mouth, visible bones in the mouth Side effects that usually do not require medical attention (report to your care team if they continue or are bothersome): Cough Diarrhea Fatigue Headache Nausea This list may not describe all possible side effects. Call your doctor for medical advice about side effects. You may report side effects to FDA at 1-800-FDA-1088. Where should I keep my medication? This medication is given in a hospital or clinic. It will not be stored at home. NOTE: This sheet is a summary. It may not cover all possible information. If you have questions about this medicine, talk to your doctor, pharmacist, or health care provider.  2024 Elsevier/Gold Standard (2021-11-07 00:00:00)  

## 2023-05-08 NOTE — Patient Instructions (Signed)
Vitamin B12 Injection What is this medication? Vitamin B12 (VAHY tuh min B12) prevents and treats low vitamin B12 levels in your body. It is used in people who do not get enough vitamin B12 from their diet or when their digestive tract does not absorb enough. Vitamin B12 plays an important role in maintaining the health of your nervous system and red blood cells. This medicine may be used for other purposes; ask your health care provider or pharmacist if you have questions. COMMON BRAND NAME(S): B-12 Compliance Kit, B-12 Injection Kit, Cyomin, Dodex, LA-12, Nutri-Twelve, Physicians EZ Use B-12, Primabalt, Vitamin Deficiency Injectable System - B12 What should I tell my care team before I take this medication? They need to know if you have any of these conditions: Kidney disease Leber's disease Megaloblastic anemia An unusual or allergic reaction to cyanocobalamin, cobalt, other medications, foods, dyes, or preservatives Pregnant or trying to get pregnant Breast-feeding How should I use this medication? This medication is injected into a muscle or deeply under the skin. It is usually given in a clinic or care team's office. However, your care team may teach you how to inject yourself. Follow all instructions. Talk to your care team about the use of this medication in children. Special care may be needed. Overdosage: If you think you have taken too much of this medicine contact a poison control center or emergency room at once. NOTE: This medicine is only for you. Do not share this medicine with others. What if I miss a dose? If you are given your dose at a clinic or care team's office, call to reschedule your appointment. If you give your own injections, and you miss a dose, take it as soon as you can. If it is almost time for your next dose, take only that dose. Do not take double or extra doses. What may interact with this medication? Alcohol Colchicine This list may not describe all possible  interactions. Give your health care provider a list of all the medicines, herbs, non-prescription drugs, or dietary supplements you use. Also tell them if you smoke, drink alcohol, or use illegal drugs. Some items may interact with your medicine. What should I watch for while using this medication? Visit your care team regularly. You may need blood work done while you are taking this medication. You may need to follow a special diet. Talk to your care team. Limit your alcohol intake and avoid smoking to get the best benefit. What side effects may I notice from receiving this medication? Side effects that you should report to your care team as soon as possible: Allergic reactions--skin rash, itching, hives, swelling of the face, lips, tongue, or throat Swelling of the ankles, hands, or feet Trouble breathing Side effects that usually do not require medical attention (report to your care team if they continue or are bothersome): Diarrhea This list may not describe all possible side effects. Call your doctor for medical advice about side effects. You may report side effects to FDA at 1-800-FDA-1088. Where should I keep my medication? Keep out of the reach of children. Store at room temperature between 15 and 30 degrees C (59 and 85 degrees F). Protect from light. Throw away any unused medication after the expiration date. NOTE: This sheet is a summary. It may not cover all possible information. If you have questions about this medicine, talk to your doctor, pharmacist, or health care provider.  2024 Elsevier/Gold Standard (2021-02-27 00:00:00) Denosumab Injection (Oncology) What is this medication? DENOSUMAB (  den oh SUE mab) prevents weakened bones caused by cancer. It may also be used to treat noncancerous bone tumors that cannot be removed by surgery. It can also be used to treat high calcium levels in the blood caused by cancer. It works by blocking a protein that causes bones to break down  quickly. This slows down the release of calcium from bones, which lowers calcium levels in your blood. It also makes your bones stronger and less likely to break (fracture). This medicine may be used for other purposes; ask your health care provider or pharmacist if you have questions. COMMON BRAND NAME(S): XGEVA What should I tell my care team before I take this medication? They need to know if you have any of these conditions: Dental disease Having surgery or tooth extraction Infection Kidney disease Low levels of calcium or vitamin D in the blood Malnutrition On hemodialysis Skin conditions or sensitivity Thyroid or parathyroid disease An unusual reaction to denosumab, other medications, foods, dyes, or preservatives Pregnant or trying to get pregnant Breast-feeding How should I use this medication? This medication is for injection under the skin. It is given by your care team in a hospital or clinic setting. A special MedGuide will be given to you before each treatment. Be sure to read this information carefully each time. Talk to your care team about the use of this medication in children. While it may be prescribed for children as young as 13 years for selected conditions, precautions do apply. Overdosage: If you think you have taken too much of this medicine contact a poison control center or emergency room at once. NOTE: This medicine is only for you. Do not share this medicine with others. What if I miss a dose? Keep appointments for follow-up doses. It is important not to miss your dose. Call your care team if you are unable to keep an appointment. What may interact with this medication? Do not take this medication with any of the following: Other medications containing denosumab This medication may also interact with the following: Medications that lower your chance of fighting infection Steroid medications, such as prednisone or cortisone This list may not describe all  possible interactions. Give your health care provider a list of all the medicines, herbs, non-prescription drugs, or dietary supplements you use. Also tell them if you smoke, drink alcohol, or use illegal drugs. Some items may interact with your medicine. What should I watch for while using this medication? Your condition will be monitored carefully while you are receiving this medication. You may need blood work while taking this medication. This medication may increase your risk of getting an infection. Call your care team for advice if you get a fever, chills, sore throat, or other symptoms of a cold or flu. Do not treat yourself. Try to avoid being around people who are sick. You should make sure you get enough calcium and vitamin D while you are taking this medication, unless your care team tells you not to. Discuss the foods you eat and the vitamins you take with your care team. Some people who take this medication have severe bone, joint, or muscle pain. This medication may also increase your risk for jaw problems or a broken thigh bone. Tell your care team right away if you have severe pain in your jaw, bones, joints, or muscles. Tell your care team if you have any pain that does not go away or that gets worse. Talk to your care team if you may  be pregnant. Serious birth defects can occur if you take this medication during pregnancy and for 5 months after the last dose. You will need a negative pregnancy test before starting this medication. Contraception is recommended while taking this medication and for 5 months after the last dose. Your care team can help you find the option that works for you. What side effects may I notice from receiving this medication? Side effects that you should report to your care team as soon as possible: Allergic reactions--skin rash, itching, hives, swelling of the face, lips, tongue, or throat Bone, joint, or muscle pain Low calcium level--muscle pain or cramps,  confusion, tingling, or numbness in the hands or feet Osteonecrosis of the jaw--pain, swelling, or redness in the mouth, numbness of the jaw, poor healing after dental work, unusual discharge from the mouth, visible bones in the mouth Side effects that usually do not require medical attention (report to your care team if they continue or are bothersome): Cough Diarrhea Fatigue Headache Nausea This list may not describe all possible side effects. Call your doctor for medical advice about side effects. You may report side effects to FDA at 1-800-FDA-1088. Where should I keep my medication? This medication is given in a hospital or clinic. It will not be stored at home. NOTE: This sheet is a summary. It may not cover all possible information. If you have questions about this medicine, talk to your doctor, pharmacist, or health care provider.  2024 Elsevier/Gold Standard (2021-11-07 00:00:00)

## 2023-05-08 NOTE — Addendum Note (Signed)
Addended by: Hyacinth Meeker A on: 05/08/2023 12:13 PM   Modules accepted: Orders

## 2023-05-19 DIAGNOSIS — J302 Other seasonal allergic rhinitis: Secondary | ICD-10-CM | POA: Diagnosis not present

## 2023-05-21 DIAGNOSIS — H5789 Other specified disorders of eye and adnexa: Secondary | ICD-10-CM | POA: Diagnosis not present

## 2023-05-22 DIAGNOSIS — I129 Hypertensive chronic kidney disease with stage 1 through stage 4 chronic kidney disease, or unspecified chronic kidney disease: Secondary | ICD-10-CM | POA: Diagnosis not present

## 2023-05-22 DIAGNOSIS — T82897A Other specified complication of cardiac prosthetic devices, implants and grafts, initial encounter: Secondary | ICD-10-CM | POA: Diagnosis not present

## 2023-05-22 DIAGNOSIS — N1831 Chronic kidney disease, stage 3a: Secondary | ICD-10-CM | POA: Diagnosis not present

## 2023-05-22 DIAGNOSIS — I429 Cardiomyopathy, unspecified: Secondary | ICD-10-CM | POA: Diagnosis not present

## 2023-05-22 DIAGNOSIS — N2581 Secondary hyperparathyroidism of renal origin: Secondary | ICD-10-CM | POA: Diagnosis not present

## 2023-05-22 DIAGNOSIS — C8330 Diffuse large B-cell lymphoma, unspecified site: Secondary | ICD-10-CM | POA: Diagnosis not present

## 2023-05-22 DIAGNOSIS — D631 Anemia in chronic kidney disease: Secondary | ICD-10-CM | POA: Diagnosis not present

## 2023-05-23 ENCOUNTER — Encounter (HOSPITAL_COMMUNITY)
Admission: RE | Admit: 2023-05-23 | Discharge: 2023-05-23 | Disposition: A | Discharge status: Home / Self Care | Payer: Medicare Other | Source: Ambulatory Visit | Attending: Medical Oncology | Admitting: Medical Oncology

## 2023-05-23 DIAGNOSIS — C8332 Diffuse large B-cell lymphoma, intrathoracic lymph nodes: Secondary | ICD-10-CM | POA: Diagnosis not present

## 2023-05-23 DIAGNOSIS — R935 Abnormal findings on diagnostic imaging of other abdominal regions, including retroperitoneum: Secondary | ICD-10-CM

## 2023-05-23 DIAGNOSIS — R599 Enlarged lymph nodes, unspecified: Secondary | ICD-10-CM | POA: Diagnosis not present

## 2023-05-23 DIAGNOSIS — C8590 Non-Hodgkin lymphoma, unspecified, unspecified site: Secondary | ICD-10-CM | POA: Diagnosis not present

## 2023-05-23 LAB — GLUCOSE, CAPILLARY: Glucose-Capillary: 112 mg/dL — ABNORMAL HIGH (ref 70–99)

## 2023-05-23 MED ORDER — FLUDEOXYGLUCOSE F - 18 (FDG) INJECTION
12.5000 | Freq: Once | INTRAVENOUS | Status: AC
  Administered 2023-05-23: 12.5 via INTRAVENOUS

## 2023-05-26 ENCOUNTER — Other Ambulatory Visit (HOSPITAL_BASED_OUTPATIENT_CLINIC_OR_DEPARTMENT_OTHER): Payer: Self-pay

## 2023-05-26 ENCOUNTER — Telehealth: Payer: Self-pay | Admitting: Pharmacist

## 2023-05-26 NOTE — Telephone Encounter (Signed)
Patient called to discuss Trulicity. He states he will return home from [place] on Friday 11/29. He has been in [place] receiving IV antibiotics. He will complete Thursday 11/28.  He would like to get Trulicity filled on Wednesday 11/27 because he sister will be able to pick up from [facility] pharmacy.  Forwarded request to [facility] outpatient pharmacy to refill Trulicity for 3 months supply.

## 2023-05-27 DIAGNOSIS — K219 Gastro-esophageal reflux disease without esophagitis: Secondary | ICD-10-CM | POA: Diagnosis not present

## 2023-05-27 DIAGNOSIS — M109 Gout, unspecified: Secondary | ICD-10-CM | POA: Diagnosis not present

## 2023-05-27 DIAGNOSIS — E785 Hyperlipidemia, unspecified: Secondary | ICD-10-CM | POA: Diagnosis not present

## 2023-05-27 DIAGNOSIS — G2581 Restless legs syndrome: Secondary | ICD-10-CM | POA: Diagnosis not present

## 2023-05-27 DIAGNOSIS — Z7984 Long term (current) use of oral hypoglycemic drugs: Secondary | ICD-10-CM | POA: Diagnosis not present

## 2023-05-27 DIAGNOSIS — Z7985 Long-term (current) use of injectable non-insulin antidiabetic drugs: Secondary | ICD-10-CM | POA: Diagnosis not present

## 2023-05-27 DIAGNOSIS — R600 Localized edema: Secondary | ICD-10-CM | POA: Diagnosis not present

## 2023-05-27 DIAGNOSIS — E1140 Type 2 diabetes mellitus with diabetic neuropathy, unspecified: Secondary | ICD-10-CM | POA: Diagnosis not present

## 2023-05-27 DIAGNOSIS — Z7901 Long term (current) use of anticoagulants: Secondary | ICD-10-CM | POA: Diagnosis not present

## 2023-05-27 DIAGNOSIS — E876 Hypokalemia: Secondary | ICD-10-CM | POA: Diagnosis not present

## 2023-05-27 DIAGNOSIS — I4891 Unspecified atrial fibrillation: Secondary | ICD-10-CM | POA: Diagnosis not present

## 2023-05-27 DIAGNOSIS — I10 Essential (primary) hypertension: Secondary | ICD-10-CM | POA: Diagnosis not present

## 2023-05-28 ENCOUNTER — Ambulatory Visit: Payer: Medicare Other | Admitting: Medical Oncology

## 2023-05-28 ENCOUNTER — Other Ambulatory Visit: Payer: Medicare Other

## 2023-05-28 ENCOUNTER — Ambulatory Visit: Payer: Medicare Other

## 2023-06-02 ENCOUNTER — Other Ambulatory Visit: Payer: Self-pay | Admitting: Family Medicine

## 2023-06-05 DIAGNOSIS — M1A09X0 Idiopathic chronic gout, multiple sites, without tophus (tophi): Secondary | ICD-10-CM | POA: Diagnosis not present

## 2023-06-05 DIAGNOSIS — Z6833 Body mass index (BMI) 33.0-33.9, adult: Secondary | ICD-10-CM | POA: Diagnosis not present

## 2023-06-05 DIAGNOSIS — M1991 Primary osteoarthritis, unspecified site: Secondary | ICD-10-CM | POA: Diagnosis not present

## 2023-06-05 DIAGNOSIS — G5793 Unspecified mononeuropathy of bilateral lower limbs: Secondary | ICD-10-CM | POA: Diagnosis not present

## 2023-06-05 DIAGNOSIS — E669 Obesity, unspecified: Secondary | ICD-10-CM | POA: Diagnosis not present

## 2023-06-06 ENCOUNTER — Encounter: Payer: Self-pay | Admitting: Family Medicine

## 2023-06-06 ENCOUNTER — Ambulatory Visit (INDEPENDENT_AMBULATORY_CARE_PROVIDER_SITE_OTHER): Payer: Medicare Other | Admitting: Family Medicine

## 2023-06-06 VITALS — BP 128/78 | HR 82 | Temp 98.4°F | Resp 18 | Ht 72.0 in | Wt 236.4 lb

## 2023-06-06 DIAGNOSIS — H6122 Impacted cerumen, left ear: Secondary | ICD-10-CM

## 2023-06-06 NOTE — Assessment & Plan Note (Signed)
Use debrox Wax was very hard and painful Return to office next week for irrigation

## 2023-06-06 NOTE — Progress Notes (Signed)
Established Patient Office Visit  Subjective   Patient ID: Richard [name], male    DOB: 1940-12-16  Age: 82 y.o. MRN: [id number]  Chief Complaint  Patient presents with   Hearing Problem    Left ear, Doctor in the hospital advised patient ear was full of wax.    HPI Discussed the use of AI scribe software for clinical note transcription with the patient, who gave verbal consent to proceed.  History of Present Illness   The patient, with a history of cardiac disease and recent pacemaker placement, presents with a complicated post-operative course. He was admitted to the hospital for two weeks and underwent five and a half weeks of rehabilitation. Five days post pacemaker placement, he developed flu-like symptoms and was prescribed antibiotics. However, his condition worsened, necessitating an ambulance transfer to another hospital. There, he was diagnosed with a bacterial infection originating from the pacemaker.  The patient was transferred to a third hospital for further management, where he was treated with two types of antibiotics for a total of 37 days. He reports feeling significantly better after starting the antibiotics. However, he was informed that he would need to continue antibiotics for the rest of his life, three times a day.  In addition to his cardiac issues, the patient reports difficulty hearing from his left ear, which he attributes to wax build-up. He has previously had wax removed from this ear. He also mentions a recent PET scan, which revealed a lymph node in the groin. The patient is awaiting further consultation with his oncologist regarding this finding.      Patient Active Problem List   Diagnosis Date Noted   Sepsis (HCC) 04/15/2023   Permanent atrial fibrillation (HCC) 10/23/2022   Simple chronic bronchitis (HCC) 06/03/2022   Cellulitis of lower extremity 04/30/2022   Type 2 DM with CKD stage 3 and hypertension (HCC) 04/30/2022   Cellulitis of right leg  04/24/2022   Stage 3b chronic kidney disease (CKD) (HCC) 04/24/2022   Left upper quadrant pain 04/08/2022   Subacute cough 04/08/2022   Chronic combined systolic and diastolic heart failure (HCC) 02/06/2022   Acute exacerbation of CHF (congestive heart failure) (HCC) 01/16/2022   Hemoptysis 12/26/2021   Subclavian vein stenosis 12/23/2021   Strep throat 11/16/2021   Multiple superficial wounds with infection 01/04/2021   Cardiac pacemaker in situ 11/20/2020   S/P CABG x 4 11/20/2020   Ascending aortic aneurysm (HCC) 11/20/2020   Senile purpura (HCC) 07/27/2020   Porokeratosis 06/20/2020   Pain due to onychomycosis of toenails of both feet 12/14/2019   Educated about COVID-19 virus infection 11/25/2019   DM2 (diabetes mellitus, type 2) (HCC) 08/10/2019   Hyperlipidemia associated with type 2 diabetes mellitus (HCC) 08/10/2019   S/P left TKA 06/29/2019   Peripheral neuropathy 07/27/2018   Uncontrolled type 2 diabetes mellitus with hyperglycemia (HCC) 05/13/2018   Diabetic peripheral neuropathy associated with type 2 diabetes mellitus (HCC) 05/13/2018   Pansinusitis 05/13/2018   Severe aortic stenosis    Complete heart block (HCC)    Paroxysmal atrial fibrillation (HCC)    Acute on chronic diastolic CHF (congestive heart failure), NYHA class 2 (HCC)    Left arm swelling    Status post transcatheter aortic valve replacement (TAVR) using bioprosthesis 09/09/2017   Demand ischemia of myocardium (HCC)    Typical atrial flutter (HCC)    Acute on chronic diastolic heart failure (HCC)    GIB (gastrointestinal bleeding) 08/16/2017   Hyperlipidemia 08/05/2017   NSTEMI (  non-ST elevated myocardial infarction) (HCC)    Aortic stenosis, severe 07/25/2017   Pancytopenia (HCC) 07/24/2017   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) 02/27/2017   Bradycardia 01/04/2017   Coronary artery disease 01/04/2017   Stenosis of carotid artery 01/04/2017   Obesity 09/18/2016    Impacted cerumen of left ear 03/20/2015   Wenckebach block    Disorder associated with type II diabetes mellitus (HCC) 05/21/2013   Spinal stenosis of lumbar region 04/08/2013   Anemia, iron deficiency 11/29/2011   B12 deficiency anemia 07/30/2011   OSA (obstructive sleep apnea) 07/06/2010   CAD, ARTERY BYPASS GRAFT 08/22/2009   Essential hypertension 03/29/2009   Bilateral lower extremity edema 02/21/2009   Hyperlipidemia LDL goal <70 11/26/2008   GOUT 08/17/2006   Past Medical History:  Diagnosis Date   Anemia    Arthritis    hips   Axillary adenopathy 02/25/2017   Bradycardia    a. holter monitor has demonstrated HRs in 30s and Weinkibach    CAD (coronary artery disease)    a. s/p CABG 2001  b.  07/28/2017 cath:   Severe three-vessel native CAD with total occlusion of LAD, ramus intermedius, first OM and RCA, patent RIMA to PDA, LIMA to LAD, sequential SVG to ramus intermedius and first OM.     Chronic lower back pain    Diffuse large B cell lymphoma (HCC)    Diverticulosis    Esophageal stricture    GERD (gastroesophageal reflux disease)    History of gout    HTN (hypertension)    Mixed hyperlipidemia    OSA on CPAP    with 2L O2 at night   Pancytopenia (HCC)    a. related to chemo therapy for B cell lymphoma   Peptic stricture of esophagus    Presence of permanent cardiac pacemaker    sees Dr. Clayborn Bigness pacemaker   SCC (squamous cell carcinoma) 01/31/2021   R zygoma, EDC   SCC (squamous cell carcinoma) 01/31/2021   L post ankle, EDC   SCC (squamous cell carcinoma) 01/31/2021   L popliteal, EDC   Severe aortic stenosis    Spinal stenosis    Squamous cell carcinoma of skin 03/22/2009   Right mandible. SCCis, hypertrophic.    Squamous cell carcinoma of skin 12/25/2021   R lateral cheek, EDC   Squamous cell carcinoma of skin 12/25/2021   R postauricular neck, EDC   Squamous cell carcinoma of skin 12/25/2021   R forearm sup, EDC   Squamous cell carcinoma of  skin 12/25/2021   R wrist, EDC   Type II diabetes mellitus (HCC)    Wears dentures    partial upper   Past Surgical History:  Procedure Laterality Date   APPENDECTOMY  ~ 1952   BACK SURGERY     CATARACT EXTRACTION W/PHACO Left 11/06/2016   Procedure: CATARACT EXTRACTION PHACO AND INTRAOCULAR LENS PLACEMENT (IOC);  Surgeon: Sallee Lange, MD;  Location: [facility] ORS;  Service: Ophthalmology;  Laterality: Left;  Lot # [id number] H [place]: 01:09.4 AP%:25.2 CDE: 30.64   CATARACT EXTRACTION W/PHACO Right 12/04/2016   Procedure: CATARACT EXTRACTION PHACO AND INTRAOCULAR LENS PLACEMENT (IOC);  Surgeon: Sallee Lange, MD;  Location: [facility];  Service: Ophthalmology;  Laterality: Right;  [place] 1:25.9 AP% 24.1 CDE 39.10 Fluid Pack lot # [id number]   COLONOSCOPY W/ BIOPSIES AND POLYPECTOMY  07/02/2011   CORONARY ANGIOPLASTY  07/02/1991   CORONARY ANGIOPLASTY WITH STENT PLACEMENT  05/31/1997   "1"   CORONARY ARTERY BYPASS GRAFT  03/01/2000   "CABG X5"   ECTROPION REPAIR Right 09/01/2018   Procedure: REPAIR OF ECTROPION BILATERAL upper and lower;  Surgeon: Imagene Riches, MD;  Location: [facility];  Service: Ophthalmology;  Laterality: Right;  Diabetic - oral meds sleep apnea   ESOPHAGEAL DILATION  X 3-4   Dr. Victorino Dike; "last one was in the 1990's"   ESOPHAGOGASTRODUODENOSCOPY     multiple   FLEXIBLE SIGMOIDOSCOPY     multiple   HYDRADENITIS EXCISION Left 02/25/2017   Procedure: EXCISION DEEP LEFT AXILLARY LYMPH NODE;  Surgeon: Claud Kelp, MD;  Location: [facility] OR;  Service: General;  Laterality: Left;   INTRAOPERATIVE TRANSTHORACIC ECHOCARDIOGRAM N/A 09/09/2017   Procedure: INTRAOPERATIVE TRANSTHORACIC ECHOCARDIOGRAM;  Surgeon: Kathleene Hazel, MD;  Location: [facility] OR;  Service: Open Heart Surgery;  Laterality: N/A;   KNEE ARTHROSCOPY Left 07/01/2009   meniscus repair   LEFT HEART CATHETERIZATION WITH CORONARY/GRAFT ANGIOGRAM N/A 03/16/2014   Procedure: LEFT HEART  CATHETERIZATION WITH Isabel Caprice;  Surgeon: Kathleene Hazel, MD;  Location: [facility] CATH LAB;  Service: Cardiovascular;  Laterality: N/A;   LUMBAR LAMINECTOMY/DECOMPRESSION MICRODISCECTOMY Right 06/17/2013   Procedure: LUMBAR LAMINECTOMY MICRODISCECTOMY L4-L5 RIGHT EXCISION OF SYNOVIAL CYST RIGHT   (1 LEVEL) RIGHT PARTIAL FACETECTOMY;  Surgeon: Jacki Cones, MD;  Location: [facility];  Service: Orthopedics;  Laterality: Right;   MYELOGRAM  04/06/2013   lumbar, Dr Darrelyn Hillock   ORBITAL LESION EXCISION Right 09/01/2018   Procedure: ORBITOTOMY WITHOUT BONE FLAP WITH REMOVAL OF LESION RIGHT;  Surgeon: Imagene Riches, MD;  Location: [facility];  Service: Ophthalmology;  Laterality: Right;   PACEMAKER IMPLANT N/A 09/10/2017   Procedure: PACEMAKER IMPLANT;  Surgeon: Duke Salvia, MD;  Location: [facility] INVAS[facility] CV LAB;  Service: Cardiovascular;  Laterality: N/A;   PANENDOSCOPY     PERIPHERAL VASCULAR BALLOON ANGIOPLASTY Left 03/16/2021   Procedure: PERIPHERAL VASCULAR BALLOON ANGIOPLASTY;  Surgeon: Leonie Douglas, MD;  Location: [facility] INVASIVE CV LAB;  Service: Cardiovascular;  Laterality: Left;  subclavian  vein   PORTACATH PLACEMENT N/A 03/06/2017   Procedure: INSERTION PORT-A-CATH AND ASPIRATE SEROMA LEFT AXILLA;  Surgeon: Claud Kelp, MD;  Location: [facility] OR;  Service: General;  Laterality: N/A;   RIGHT/LEFT HEART CATH AND CORONARY/GRAFT ANGIOGRAPHY N/A 07/28/2017   Procedure: RIGHT/LEFT HEART CATH AND CORONARY/GRAFT ANGIOGRAPHY;  Surgeon: Tonny Bollman, MD;  Location: [facility] INVASIVE CV LAB;  Service: Cardiovascular;  Laterality: N/A;   SHOULDER SURGERY Right 08/30/2010   screws placed; "tendons tore off"   SKIN CANCER EXCISION Right    "neck"   TONSILLECTOMY  ~ 1954   TOTAL KNEE ARTHROPLASTY Left 06/29/2019   Procedure: TOTAL KNEE ARTHROPLASTY;  Surgeon: Durene Romans, MD;  Location: [facility];  Service: Orthopedics;  Laterality: Left;  70 mins   TRANSCATHETER AORTIC VALVE  REPLACEMENT, TRANSFEMORAL N/A 09/09/2017   Procedure: TRANSCATHETER AORTIC VALVE REPLACEMENT, TRANSFEMORAL;  Surgeon: Kathleene Hazel, MD;  Location: [facility] OR;  Service: Open Heart Surgery;  Laterality: N/A;   UPPER EXTREMITY VENOGRAPHY Left 02/15/2021   Procedure: CENTRAL VENO;  Surgeon: Leonie Douglas, MD;  Location: [facility] INVASIVE CV LAB;  Service: Cardiovascular;  Laterality: Left;   UPPER EXTREMITY VENOGRAPHY N/A 03/16/2021   Procedure: UPPER EXTREMITY VENOGRAPHY;  Surgeon: Leonie Douglas, MD;  Location: [facility] INVASIVE CV LAB;  Service: Cardiovascular;  Laterality: N/A;   UPPER GI ENDOSCOPY  07/02/2011   Gastritis; Dr Leone Payor   VASECTOMY     wireless pacemaker placed     Social History  Tobacco Use   Smoking status: Never   Smokeless tobacco: Never  Vaping Use   Vaping status: Never Used  Substance Use Topics   Alcohol use: No    Alcohol/week: 0.0 standard drinks of alcohol    Comment: "last drink was in 2012"( 03/15/2014)   Drug use: No   Social History   Socioeconomic History   Marital status: Divorced    Spouse name: Not on file   Number of children: 2   Years of education: college   Highest education level: Not on file  Occupational History    Employer: RETIRED  Tobacco Use   Smoking status: Never   Smokeless tobacco: Never  Vaping Use   Vaping status: Never Used  Substance and Sexual Activity   Alcohol use: No    Alcohol/week: 0.0 standard drinks of alcohol    Comment: "last drink was in 2012"( 03/15/2014)   Drug use: No   Sexual activity: Not Currently  Other Topics Concern   Not on file  Social History Narrative   ** Merged History Encounter **       Divorced, lives with a roommate. 1 son one daughter 3-4 caffeinated beverages daily Right-handed. 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   Social Determinants of Health    Financial Resource Strain: Low Risk  (02/11/2022)   Overall Financial Resource Strain (CARDIA)    Difficulty of Paying Living Expenses: Not very hard  Food Insecurity: Low Risk  (05/06/2023)   Received from [facility]   Hunger Vital Sign    Worried About Running Out of Food in the Last Year: Never true    Ran Out of Food in the Last Year: Never true  Transportation Needs: No Transportation Needs (05/06/2023)   Received from [facility]    In the past 12 months, has lack of reliable transportation kept you from medical appointments, meetings, work or from getting things needed for daily living? : No  Physical Activity: Insufficiently Active (02/11/2022)   Exercise Vital Sign    Days of Exercise per Week: 5 days    Minutes of Exercise per Session: 10 min  Stress: No Stress Concern Present (12/24/2020)   Received from [facility] - Occupational Stress Questionnaire    Feeling of Stress : Not at all  Social Connections: Unknown (11/13/2021)   Received from [facility]   Social Network    Social Network: Not on file  Recent Concern: Social Connections - Moderately Isolated (09/06/2021)   Social Connection and Isolation Panel [NHANES]    Frequency of Communication with Friends and Family: More than three times a week    Frequency of Social Gatherings with Friends and Family: More than three times a week    Attends Religious Services: More than 4 times per year    Active Member of [employer] or Organizations: No    Attends Banker Meetings: Never    Marital Status: Divorced  Catering manager Violence: Not At Risk (04/16/2023)   Humiliation, Afraid, Rape, and Kick questionnaire    Fear of Current or Ex-Partner: No    Emotionally Abused: No    Physically Abused: No    Sexually Abused: No   Family Status  Relation Name Status   Father  Deceased   Mother  Deceased  MGM  Deceased    PGM  Deceased   MGF  Deceased   PGF  Deceased   Neg Hx  (Not Specified)  No partnership data on file   Family History  Problem Relation Age of Onset   Stroke Father    Hypertension Father    Pancreatic cancer Mother    Diabetes Maternal Grandmother    Stroke Maternal Grandmother    Heart attack Paternal Grandmother    Colon cancer Neg Hx    Esophageal cancer Neg Hx    Rectal cancer Neg Hx    Stomach cancer Neg Hx    Ulcers Neg Hx    Allergies  Allergen Reactions   Ace Inhibitors Swelling and Other (See Comments)    Angioedema    Benazepril Swelling and Other (See Comments)    Angioedema; he is not a candidate for any angiotensin receptor blockers because of this significant allergic reaction. Because of a history of documented adverse serious drug reaction;Medi Alert bracelet  is recommended   Entresto [Sacubitril-Valsartan] Swelling and Other (See Comments)    First-in-Class Angiotensin Receptor Neprilysin Inhibitor- Med was "red-flagged" by the patient's pharmacy for him to NOT take!   Hctz [Hydrochlorothiazide] Anaphylaxis and Swelling    Tongue and lip swelling    Aspirin Other (See Comments)    Gastritis, can aspirin not take 325 mg aspirin      ROS    Objective:     BP 128/78 (BP Location: Left Arm, Patient Position: Sitting, Cuff Size: Large)   Pulse 82   Temp 98.4 F (36.9 C) (Oral)   Resp 18   Ht 6' (1.829 m)   Wt 236 lb 6.4 oz (107.2 kg)   SpO2 95%   BMI 32.06 kg/m  BP Readings from Last 3 Encounters:  06/06/23 128/78  05/08/23 (!) 108/49  04/16/23 126/82   Wt Readings from Last 3 Encounters:  06/06/23 236 lb 6.4 oz (107.2 kg)  05/08/23 235 lb 0.6 oz (106.6 kg)  04/14/23 231 lb 7.7 oz (105 kg)   SpO2 Readings from Last 3 Encounters:  06/06/23 95%  05/08/23 100%  04/16/23 99%      Physical Exam Vitals and nursing note reviewed.  Constitutional:      General: He is not in acute distress.    Appearance: Normal appearance. He is  well-developed.  HENT:     Head: Normocephalic and atraumatic.     Comments: L ear---- + cerumen impaction     Right Ear: Tympanic membrane, ear canal and external ear normal. There is no impacted cerumen.     Left Ear: There is impacted cerumen.  Eyes:     General: No scleral icterus.       Right eye: No discharge.        Left eye: No discharge.  Cardiovascular:     Rate and Rhythm: Normal rate and regular rhythm.     Heart sounds: No murmur heard. Pulmonary:     Effort: Pulmonary effort is normal. No respiratory distress.     Breath sounds: Normal breath sounds.  Musculoskeletal:        General: Normal range of motion.     Cervical back: Normal range of motion and neck supple.     Right lower leg: No edema.     Left lower leg: No edema.  Skin:    General: Skin is warm and dry.  Neurological:     Mental Status: He is alert and oriented to person, place,  and time.  Psychiatric:        Mood and Affect: Mood normal.        Behavior: Behavior normal.        Thought Content: Thought content normal.        Judgment: Judgment normal.      No results found for any visits on 06/06/23.  Last CBC Lab Results  Component Value Date   WBC 7.7 05/08/2023   HGB 13.2 05/08/2023   HCT 40.2 05/08/2023   MCV 90.5 05/08/2023   MCH 29.7 05/08/2023   RDW 16.9 (H) 05/08/2023   PLT 202 05/08/2023   Last metabolic panel Lab Results  Component Value Date   GLUCOSE 181 (H) 05/08/2023   NA 141 05/08/2023   K 3.1 (L) 05/08/2023   CL 100 05/08/2023   CO2 32 05/08/2023   BUN 23 05/08/2023   CREATININE 1.68 (H) 05/08/2023   GFRNONAA 40 (L) 05/08/2023   CALCIUM 9.4 05/08/2023   PHOS 3.0 08/17/2017   PROT 6.9 05/08/2023   ALBUMIN 4.3 05/08/2023   LABGLOB 2.8 07/27/2018   AGRATIO 1.5 07/27/2018   BILITOT 0.4 05/08/2023   ALKPHOS 51 05/08/2023   AST 18 05/08/2023   ALT 15 05/08/2023   ANIONGAP 9 05/08/2023   Last lipids Lab Results  Component Value Date   CHOL 182 04/10/2023    HDL 32.50 (L) 04/10/2023   LDLCALC 80 04/10/2023   LDLDIRECT 78.0 11/26/2022   TRIG 347.0 (H) 04/10/2023   CHOLHDL 6 04/10/2023   Last hemoglobin A1c Lab Results  Component Value Date   HGBA1C 9.1 (H) 04/10/2023   Last thyroid functions Lab Results  Component Value Date   TSH 2.450 07/27/2018   Last vitamin D Lab Results  Component Value Date   VD25OH 16.1 (L) 07/27/2018   Last vitamin B12 and Folate Lab Results  Component Value Date   VITAMINB12 302 04/07/2023   FOLATE 9.8 08/18/2017      The ASCVD Risk score (Arnett DK, et al., 2019) failed to calculate for the following reasons:   The 2019 ASCVD risk score is only valid for ages 64 to 75   The patient has a prior MI or stroke diagnosis    Assessment & Plan:   Problem List Items Addressed This Visit       Unprioritized   Impacted cerumen of left ear - Primary    Use debrox Wax was very hard and painful Return to office next week for irrigation      Assessment and Plan    Pacemaker-Associated Infection   Following his pacemaker implantation on October 2nd, he developed flu-like symptoms and a severe bacterial infection originating from the gut, necessitating hospitalization and prolonged antibiotic therapy. He is currently on a lifelong antibiotic regimen to prevent recurrence, as discontinuation poses a risk of bacterial reinvasion. We will continue his current antibiotic regimen TID.  Lymphadenopathy (Groin)   A recent PET scan revealed a groin lymph node, with an oncologist evaluation scheduled for Monday. The lymphadenopathy is suspected to be related to his previous infection. We will await the oncologist's evaluation and follow-up.  Impacted Cerumen (Left Ear)   He reports difficulty hearing in the left ear due to cerumen impaction, confirmed by another physician, while the right ear remains unaffected. Manual removal was unsuccessful due to the hardness of the cerumen. We recommend OTC Debrox drops to  soften the earwax and have scheduled a follow-up appointment for earwax removal next week.  No follow-ups on file.    Donato Schultz, DO

## 2023-06-09 ENCOUNTER — Inpatient Hospital Stay (HOSPITAL_BASED_OUTPATIENT_CLINIC_OR_DEPARTMENT_OTHER): Payer: Medicare Other | Admitting: Medical Oncology

## 2023-06-09 ENCOUNTER — Encounter: Payer: Self-pay | Admitting: Family Medicine

## 2023-06-09 ENCOUNTER — Ambulatory Visit (INDEPENDENT_AMBULATORY_CARE_PROVIDER_SITE_OTHER): Payer: Medicare Other | Admitting: Family Medicine

## 2023-06-09 ENCOUNTER — Inpatient Hospital Stay: Payer: Medicare Other

## 2023-06-09 ENCOUNTER — Encounter: Payer: Self-pay | Admitting: Medical Oncology

## 2023-06-09 ENCOUNTER — Inpatient Hospital Stay: Payer: Medicare Other | Attending: Hematology & Oncology

## 2023-06-09 VITALS — BP 131/79 | HR 70 | Temp 98.2°F | Ht 72.0 in | Wt 233.2 lb

## 2023-06-09 VITALS — BP 115/61 | HR 64 | Temp 97.7°F | Resp 20 | Ht 72.0 in | Wt 232.1 lb

## 2023-06-09 DIAGNOSIS — D519 Vitamin B12 deficiency anemia, unspecified: Secondary | ICD-10-CM | POA: Diagnosis not present

## 2023-06-09 DIAGNOSIS — C8332 Diffuse large B-cell lymphoma, intrathoracic lymph nodes: Secondary | ICD-10-CM | POA: Diagnosis not present

## 2023-06-09 DIAGNOSIS — D508 Other iron deficiency anemias: Secondary | ICD-10-CM

## 2023-06-09 DIAGNOSIS — D510 Vitamin B12 deficiency anemia due to intrinsic factor deficiency: Secondary | ICD-10-CM | POA: Insufficient documentation

## 2023-06-09 DIAGNOSIS — Z950 Presence of cardiac pacemaker: Secondary | ICD-10-CM | POA: Diagnosis not present

## 2023-06-09 DIAGNOSIS — R599 Enlarged lymph nodes, unspecified: Secondary | ICD-10-CM

## 2023-06-09 DIAGNOSIS — Z79899 Other long term (current) drug therapy: Secondary | ICD-10-CM | POA: Diagnosis not present

## 2023-06-09 DIAGNOSIS — H6122 Impacted cerumen, left ear: Secondary | ICD-10-CM

## 2023-06-09 DIAGNOSIS — D511 Vitamin B12 deficiency anemia due to selective vitamin B12 malabsorption with proteinuria: Secondary | ICD-10-CM

## 2023-06-09 DIAGNOSIS — E611 Iron deficiency: Secondary | ICD-10-CM | POA: Insufficient documentation

## 2023-06-09 LAB — CBC WITH DIFFERENTIAL (CANCER CENTER ONLY)
Abs Immature Granulocytes: 0.08 10*3/uL — ABNORMAL HIGH (ref 0.00–0.07)
Basophils Absolute: 0.1 10*3/uL (ref 0.0–0.1)
Basophils Relative: 1 %
Eosinophils Absolute: 0.5 10*3/uL (ref 0.0–0.5)
Eosinophils Relative: 5 %
HCT: 45.9 % (ref 39.0–52.0)
Hemoglobin: 15 g/dL (ref 13.0–17.0)
Immature Granulocytes: 1 %
Lymphocytes Relative: 20 %
Lymphs Abs: 1.8 10*3/uL (ref 0.7–4.0)
MCH: 29.9 pg (ref 26.0–34.0)
MCHC: 32.7 g/dL (ref 30.0–36.0)
MCV: 91.4 fL (ref 80.0–100.0)
Monocytes Absolute: 0.6 10*3/uL (ref 0.1–1.0)
Monocytes Relative: 7 %
Neutro Abs: 6.3 10*3/uL (ref 1.7–7.7)
Neutrophils Relative %: 66 %
Platelet Count: 215 10*3/uL (ref 150–400)
RBC: 5.02 MIL/uL (ref 4.22–5.81)
RDW: 15.8 % — ABNORMAL HIGH (ref 11.5–15.5)
WBC Count: 9.4 10*3/uL (ref 4.0–10.5)
nRBC: 0 % (ref 0.0–0.2)

## 2023-06-09 LAB — CMP (CANCER CENTER ONLY)
ALT: 20 U/L (ref 0–44)
AST: 24 U/L (ref 15–41)
Albumin: 4.4 g/dL (ref 3.5–5.0)
Alkaline Phosphatase: 68 U/L (ref 38–126)
Anion gap: 12 (ref 5–15)
BUN: 42 mg/dL — ABNORMAL HIGH (ref 8–23)
CO2: 31 mmol/L (ref 22–32)
Calcium: 10.8 mg/dL — ABNORMAL HIGH (ref 8.9–10.3)
Chloride: 98 mmol/L (ref 98–111)
Creatinine: 1.8 mg/dL — ABNORMAL HIGH (ref 0.61–1.24)
GFR, Estimated: 37 mL/min — ABNORMAL LOW (ref >60)
Glucose, Bld: 178 mg/dL — ABNORMAL HIGH (ref 70–99)
Potassium: 4.3 mmol/L (ref 3.5–5.1)
Sodium: 141 mmol/L (ref 135–145)
Total Bilirubin: 0.6 mg/dL (ref <1.2)
Total Protein: 7.5 g/dL (ref 6.5–8.1)

## 2023-06-09 LAB — LACTATE DEHYDROGENASE: LDH: 169 U/L (ref 98–192)

## 2023-06-09 LAB — FERRITIN: Ferritin: 1477 ng/mL — ABNORMAL HIGH (ref 24–336)

## 2023-06-09 LAB — VITAMIN B12: Vitamin B-12: 343 pg/mL (ref 180–914)

## 2023-06-09 MED ORDER — CYANOCOBALAMIN 1000 MCG/ML IJ SOLN
1000.0000 ug | Freq: Once | INTRAMUSCULAR | Status: AC
Start: 2023-06-09 — End: 2023-06-09
  Administered 2023-06-09: 1000 ug via INTRAMUSCULAR
  Filled 2023-06-09: qty 1

## 2023-06-09 NOTE — Progress Notes (Signed)
Established Patient Office Visit  Subjective   Patient ID: Richard [name], male    DOB: January 02, 1941  Age: 82 y.o. MRN: [id number]  Chief Complaint  Patient presents with   Cerumen Impaction    Patient is here for an ear cleaning.    HPI Pt here to have ears irrigated.  He has been using the debrox bid  Patient Active Problem List   Diagnosis Date Noted   Sepsis (HCC) 04/15/2023   Permanent atrial fibrillation (HCC) 10/23/2022   Simple chronic bronchitis (HCC) 06/03/2022   Cellulitis of lower extremity 04/30/2022   Type 2 DM with CKD stage 3 and hypertension (HCC) 04/30/2022   Cellulitis of right leg 04/24/2022   Stage 3b chronic kidney disease (CKD) (HCC) 04/24/2022   Left upper quadrant pain 04/08/2022   Subacute cough 04/08/2022   Chronic combined systolic and diastolic heart failure (HCC) 02/06/2022   Acute exacerbation of CHF (congestive heart failure) (HCC) 01/16/2022   Hemoptysis 12/26/2021   Subclavian vein stenosis 12/23/2021   Strep throat 11/16/2021   Multiple superficial wounds with infection 01/04/2021   Cardiac pacemaker in situ 11/20/2020   S/P CABG x 4 11/20/2020   Ascending aortic aneurysm (HCC) 11/20/2020   Senile purpura (HCC) 07/27/2020   Porokeratosis 06/20/2020   Pain due to onychomycosis of toenails of both feet 12/14/2019   Educated about COVID-19 virus infection 11/25/2019   DM2 (diabetes mellitus, type 2) (HCC) 08/10/2019   Hyperlipidemia associated with type 2 diabetes mellitus (HCC) 08/10/2019   S/P left TKA 06/29/2019   Peripheral neuropathy 07/27/2018   Uncontrolled type 2 diabetes mellitus with hyperglycemia (HCC) 05/13/2018   Diabetic peripheral neuropathy associated with type 2 diabetes mellitus (HCC) 05/13/2018   Pansinusitis 05/13/2018   Severe aortic stenosis    Complete heart block (HCC)    Paroxysmal atrial fibrillation (HCC)    Acute on chronic diastolic CHF (congestive heart failure), NYHA class 2 (HCC)    Left arm swelling     Status post transcatheter aortic valve replacement (TAVR) using bioprosthesis 09/09/2017   Demand ischemia of myocardium (HCC)    Typical atrial flutter (HCC)    Acute on chronic diastolic heart failure (HCC)    GIB (gastrointestinal bleeding) 08/16/2017   Hyperlipidemia 08/05/2017   NSTEMI (non-ST elevated myocardial infarction) (HCC)    Aortic stenosis, severe 07/25/2017   Pancytopenia (HCC) 07/24/2017   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) 02/27/2017   Bradycardia 01/04/2017   Coronary artery disease 01/04/2017   Stenosis of carotid artery 01/04/2017   Obesity 09/18/2016   Impacted cerumen of left ear 03/20/2015   Wenckebach block    Disorder associated with type II diabetes mellitus (HCC) 05/21/2013   Spinal stenosis of lumbar region 04/08/2013   Anemia, iron deficiency 11/29/2011   B12 deficiency anemia 07/30/2011   OSA (obstructive sleep apnea) 07/06/2010   CAD, ARTERY BYPASS GRAFT 08/22/2009   Essential hypertension 03/29/2009   Bilateral lower extremity edema 02/21/2009   Hyperlipidemia LDL goal <70 11/26/2008   GOUT 08/17/2006   Past Medical History:  Diagnosis Date   Anemia    Arthritis    hips   Axillary adenopathy 02/25/2017   Bradycardia    a. holter monitor has demonstrated HRs in 30s and Weinkibach    CAD (coronary artery disease)    a. s/p CABG 2001  b.  07/28/2017 cath:   Severe three-vessel native CAD with total occlusion of LAD, ramus intermedius, first OM and RCA, patent RIMA to  PDA, LIMA to LAD, sequential SVG to ramus intermedius and first OM.     Chronic lower back pain    Diffuse large B cell lymphoma (HCC)    Diverticulosis    Esophageal stricture    GERD (gastroesophageal reflux disease)    History of gout    HTN (hypertension)    Mixed hyperlipidemia    OSA on CPAP    with 2L O2 at night   Pancytopenia (HCC)    a. related to chemo therapy for B cell lymphoma   Peptic stricture of esophagus    Presence of permanent  cardiac pacemaker    sees Dr. Clayborn Bigness pacemaker   SCC (squamous cell carcinoma) 01/31/2021   R zygoma, EDC   SCC (squamous cell carcinoma) 01/31/2021   L post ankle, EDC   SCC (squamous cell carcinoma) 01/31/2021   L popliteal, EDC   Severe aortic stenosis    Spinal stenosis    Squamous cell carcinoma of skin 03/22/2009   Right mandible. SCCis, hypertrophic.    Squamous cell carcinoma of skin 12/25/2021   R lateral cheek, EDC   Squamous cell carcinoma of skin 12/25/2021   R postauricular neck, EDC   Squamous cell carcinoma of skin 12/25/2021   R forearm sup, EDC   Squamous cell carcinoma of skin 12/25/2021   R wrist, EDC   Type II diabetes mellitus (HCC)    Wears dentures    partial upper   Past Surgical History:  Procedure Laterality Date   APPENDECTOMY  ~ 1952   BACK SURGERY     CATARACT EXTRACTION W/PHACO Left 11/06/2016   Procedure: CATARACT EXTRACTION PHACO AND INTRAOCULAR LENS PLACEMENT (IOC);  Surgeon: Sallee Lange, MD;  Location: [facility] ORS;  Service: Ophthalmology;  Laterality: Left;  Lot # [id number] [place]: 01:09.4 AP%:25.2 CDE: 30.64   CATARACT EXTRACTION W/PHACO Right 12/04/2016   Procedure: CATARACT EXTRACTION PHACO AND INTRAOCULAR LENS PLACEMENT (IOC);  Surgeon: Sallee Lange, MD;  Location: [facility] ORS;  Service: Ophthalmology;  Laterality: Right;  [place] 1:25.9 AP% 24.1 CDE 39.10 Fluid Pack lot # [id number]   COLONOSCOPY W/ BIOPSIES AND POLYPECTOMY  07/02/2011   CORONARY ANGIOPLASTY  07/02/1991   CORONARY ANGIOPLASTY WITH STENT PLACEMENT  05/31/1997   "1"   CORONARY ARTERY BYPASS GRAFT  03/01/2000   "CABG X5"   ECTROPION REPAIR Right 09/01/2018   Procedure: REPAIR OF ECTROPION BILATERAL upper and lower;  Surgeon: Imagene Riches, MD;  Location: [facility];  Service: Ophthalmology;  Laterality: Right;  Diabetic - oral meds sleep apnea   ESOPHAGEAL DILATION  X 3-4   Dr. Victorino Dike; "last one was in the 1990's"   ESOPHAGOGASTRODUODENOSCOPY      multiple   FLEXIBLE SIGMOIDOSCOPY     multiple   HYDRADENITIS EXCISION Left 02/25/2017   Procedure: EXCISION DEEP LEFT AXILLARY LYMPH NODE;  Surgeon: Claud Kelp, MD;  Location[facility] OR;  Service: General;  Laterality: Left;   INTRAOPERATIVE TRANSTHORACIC ECHOCARDIOGRAM N/A 09/09/2017   Procedure: INTRAOPERATIVE TRANSTHORACIC ECHOCARDIOGRAM;  Surgeon: Kathleene Hazel, MD;  Location: [facility] OR;  Service: Open Heart Surgery;  Laterality: N/A;   KNEE ARTHROSCOPY Left 07/01/2009   meniscus repair   LEFT HEART CATHETERIZATION WITH CORONARY/GRAFT ANGIOGRAM N/A 03/16/2014   Procedure: LEFT HEART CATHETERIZATION WITH Isabel Caprice;  Surgeon: Kathleene Hazel, MD;  Location: [facility] CATH LAB;  Service: Cardiovascular;  Laterality: N/A;   LUMBAR LAMINECTOMY/DECOMPRESSION MICRODISCECTOMY Right 06/17/2013   Procedure: LUMBAR LAMINECTOMY MICRODISCECTOMY L4-L5 RIGHT EXCISION OF SYNOVIAL CYST RIGHT   (  1 LEVEL) RIGHT PARTIAL FACETECTOMY;  Surgeon: Jacki Cones, MD;  Location: [facility] OR[facility];  Service: Orthopedics;  Laterality: Right;   MYELOGRAM  04/06/2013   lumbar, Dr Darrelyn Hillock   ORBITAL LESION EXCISION Right 09/01/2018   Procedure: ORBITOTOMY WITHOUT BONE FLAP WITH REMOVAL OF LESION RIGHT;  Surgeon: Imagene Riches, MD;  Location: [facility];  Service: Ophthalmology;  Laterality: Right;   PACEMAKER IMPLANT N/A 09/10/2017   Procedure: PACEMAKER IMPLANT;  Surgeon: Duke Salvia, MD;  Location: [facility] INVASIVE CV LAB;  Service: Cardiovascular;  Laterality: N/A;   PANENDOSCOPY     PERIPHERAL VASCULAR BALLOON ANGIOPLASTY Left 03/16/2021   Procedure: PERIPHERAL VASCULAR BALLOON ANGIOPLASTY;  Surgeon: Leonie Douglas, MD;  Location: [facility] INVASIVE CV LAB;  Service: Cardiovascular;  Laterality: Left;  subclavian  vein   PORTACATH PLACEMENT N/A 03/06/2017   Procedure: INSERTION PORT-A-CATH AND ASPIRATE SEROMA LEFT AXILLA;  Surgeon: Claud Kelp, MD;  Location: [facility] OR;  Service: General;   Laterality: N/A;   RIGHT/LEFT HEART CATH AND CORONARY/GRAFT ANGIOGRAPHY N/A 07/28/2017   Procedure: RIGHT/LEFT HEART CATH AND CORONARY/GRAFT ANGIOGRAPHY;  Surgeon: Tonny Bollman, MD;  Location: [facility] INVASIVE CV LAB;  Service: Cardiovascular;  Laterality: N/A;   SHOULDER SURGERY Right 08/30/2010   screws placed; "tendons tore off"   SKIN CANCER EXCISION Right    "neck"   TONSILLECTOMY  ~ 1954   TOTAL KNEE ARTHROPLASTY Left 06/29/2019   Procedure: TOTAL KNEE ARTHROPLASTY;  Surgeon: Durene Romans, MD;  Location: [facility];  Service: Orthopedics;  Laterality: Left;  70 mins   TRANSCATHETER AORTIC VALVE REPLACEMENT, TRANSFEMORAL N/A 09/09/2017   Procedure: TRANSCATHETER AORTIC VALVE REPLACEMENT, TRANSFEMORAL;  Surgeon: Kathleene Hazel, MD;  Location: [facility] OR;  Service: Open Heart Surgery;  Laterality: N/A;   UPPER EXTREMITY VENOGRAPHY Left 02/15/2021   Procedure: CENTRAL VENO;  Surgeon: Leonie Douglas, MD;  Location: [facility] INVASIVE CV LAB;  Service: Cardiovascular;  Laterality: Left;   UPPER EXTREMITY VENOGRAPHY N/A 03/16/2021   Procedure: UPPER EXTREMITY VENOGRAPHY;  Surgeon: Leonie Douglas, MD;  Location: [facility] INVASIVE CV LAB;  Service: Cardiovascular;  Laterality: N/A;   UPPER GI ENDOSCOPY  07/02/2011   Gastritis; Dr Leone Payor   VASECTOMY     wireless pacemaker placed     Social History   Tobacco Use   Smoking status: Never   Smokeless tobacco: Never  Vaping Use   Vaping status: Never Used  Substance Use Topics   Alcohol use: No    Alcohol/week: 0.0 standard drinks of alcohol    Comment: "last drink was in 2012"( 03/15/2014)   Drug use: No   Social History   Socioeconomic History   Marital status: Divorced    Spouse name: Not on file   Number of children: 2   Years of education: college   Highest education level: Not on file  Occupational History    Employer: RETIRED  Tobacco Use   Smoking status: Never   Smokeless tobacco: Never  Vaping Use   Vaping status: Never Used   Substance and Sexual Activity   Alcohol use: No    Alcohol/week: 0.0 standard drinks of alcohol    Comment: "last drink was in 2012"( 03/15/2014)   Drug use: No   Sexual activity: Not Currently  Other Topics Concern   Not on file  Social History Narrative   ** Merged History Encounter **       Divorced, lives with a roommate. 1 son one daughter 3-4 caffeinated beverages daily Right-handed. 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   Social Determinants of Health   Financial Resource Strain: Low Risk  (02/11/2022)   Overall Financial Resource Strain (CARDIA)    Difficulty of Paying Living Expenses: Not very hard  Food Insecurity: Low Risk  (05/06/2023)   Received from [facility]   Hunger Vital Sign    Worried About Running Out of Food in the Last Year: Never true    Ran Out of Food in the Last Year: Never true  Transportation Needs: No Transportation Needs (05/06/2023)   Received from [facility]    In the past 12 months, has lack of reliable transportation kept you from medical appointments, meetings, work or from getting things needed for daily living? : No  Physical Activity: Insufficiently Active (02/11/2022)   Exercise Vital Sign    Days of Exercise per Week: 5 days    Minutes of Exercise per Session: 10 min  Stress: No Stress Concern Present (12/24/2020)   Received from [facility], [facility]   [employer] of Occupational Health - Occupational Stress Questionnaire    Feeling of Stress : Not at all  Social Connections: Unknown (11/13/2021)   Received from [facility], [facility]   Social Network    Social Network: Not on file  Recent Concern: Social Connections - Moderately Isolated (09/06/2021)   Social Connection and Isolation Panel [NHANES]    Frequency of Communication with Friends and Family: More than three  times a week    Frequency of Social Gatherings with Friends and Family: More than three times a week    Attends Religious Services: More than 4 times per year    Active Member of [employer] or Organizations: No    Attends Banker Meetings: Never    Marital Status: Divorced  Catering manager Violence: Not At Risk (04/16/2023)   Humiliation, Afraid, Rape, and Kick questionnaire    Fear of Current or Ex-Partner: No    Emotionally Abused: No    Physically Abused: No    Sexually Abused: No   Family Status  Relation Name Status   Father  Deceased   Mother  Deceased   MGM  Deceased   PGM  Deceased   MGF  Deceased   PGF  Deceased   Neg Hx  (Not Specified)  No partnership data on file   Family History  Problem Relation Age of Onset   Stroke Father    Hypertension Father    Pancreatic cancer Mother    Diabetes Maternal Grandmother    Stroke Maternal Grandmother    Heart attack Paternal Grandmother    Colon cancer Neg Hx    Esophageal cancer Neg Hx    Rectal cancer Neg Hx    Stomach cancer Neg Hx    Ulcers Neg Hx    Allergies  Allergen Reactions   Ace Inhibitors Swelling and Other (See Comments)    Angioedema    Benazepril Swelling and Other (See Comments)    Angioedema; he is not a candidate for any angiotensin receptor blockers because of this significant allergic reaction. Because of a history of documented adverse serious drug reaction;Medi Alert bracelet  is recommended   Entresto [Sacubitril-Valsartan] Swelling and Other (See Comments)    First-in-Class Angiotensin Receptor Neprilysin Inhibitor- Med was "red-flagged" by the patient's pharmacy for him to NOT take!   Hctz [  Hydrochlorothiazide] Anaphylaxis and Swelling    Tongue and lip swelling    Aspirin Other (See Comments)    Gastritis, can aspirin not take 325 mg aspirin      Review of Systems  Constitutional:  Negative for fever and malaise/fatigue.  HENT:  Positive for hearing loss. Negative for  congestion.   Eyes:  Negative for blurred vision.  Respiratory:  Negative for shortness of breath.   Cardiovascular:  Negative for chest pain, palpitations and leg swelling.  Gastrointestinal:  Negative for abdominal pain, blood in stool and nausea.  Genitourinary:  Negative for dysuria and frequency.  Musculoskeletal:  Negative for falls.  Skin:  Negative for rash.  Neurological:  Negative for dizziness, loss of consciousness and headaches.  Endo/Heme/Allergies:  Negative for environmental allergies.  Psychiatric/Behavioral:  Negative for depression. The patient is not nervous/anxious.       Objective:     BP 131/79   Pulse 70   Temp 98.2 F (36.8 C) (Oral)   Ht 6' (1.829 m)   Wt 233 lb 4 oz (105.8 kg)   SpO2 94%   BMI 31.63 kg/m  BP Readings from Last 3 Encounters:  06/09/23 131/79  06/09/23 115/61  06/06/23 128/78   Wt Readings from Last 3 Encounters:  06/09/23 233 lb 4 oz (105.8 kg)  06/09/23 232 lb 1.9 oz (105.3 kg)  06/06/23 236 lb 6.4 oz (107.2 kg)   SpO2 Readings from Last 3 Encounters:  06/09/23 94%  06/09/23 95%  06/06/23 95%    Physical Exam Vitals and nursing note reviewed.  Constitutional:      General: He is not in acute distress.    Appearance: Normal appearance. He is well-developed.  HENT:     Head: Normocephalic and atraumatic.     Left Ear: There is impacted cerumen.     Ears:     Comments: Peroxide and water used to irrigate ear --- large piece of wax removed  And pt got relief Eyes:     General: No scleral icterus.       Right eye: No discharge.        Left eye: No discharge.  Cardiovascular:     Rate and Rhythm: Normal rate and regular rhythm.     Heart sounds: No murmur heard. Pulmonary:     Effort: Pulmonary effort is normal. No respiratory distress.     Breath sounds: Normal breath sounds.  Musculoskeletal:        General: Normal range of motion.     Cervical back: Normal range of motion and neck supple.     Right lower leg:  No edema.     Left lower leg: No edema.  Skin:    General: Skin is warm and dry.  Neurological:     Mental Status: He is alert and oriented to person, place, and time.  Psychiatric:        Mood and Affect: Mood normal.        Behavior: Behavior normal.        Thought Content: Thought content normal.        Judgment: Judgment normal.      Results for orders placed or performed in visit on 06/09/23  CBC with Differential (Cancer Center Only)  Result Value Ref Range   WBC Count 9.4 4.0 - 10.5 K/uL   RBC 5.02 4.22 - 5.81 MIL/uL   Hemoglobin 15.0 13.0 - 17.0 g/dL   HCT 62.1 30.8 - 65.7 %   MCV 91.4  80.0 - 100.0 fL   MCH 29.9 26.0 - 34.0 pg   MCHC 32.7 30.0 - 36.0 g/dL   RDW 86.5 (H) 78.4 - 69.6 %   Platelet Count 215 150 - 400 K/uL   nRBC 0.0 0.0 - 0.2 %   Neutrophils Relative % 66 %   Neutro Abs 6.3 1.7 - 7.7 K/uL   Lymphocytes Relative 20 %   Lymphs Abs 1.8 0.7 - 4.0 K/uL   Monocytes Relative 7 %   Monocytes Absolute 0.6 0.1 - 1.0 K/uL   Eosinophils Relative 5 %   Eosinophils Absolute 0.5 0.0 - 0.5 K/uL   Basophils Relative 1 %   Basophils Absolute 0.1 0.0 - 0.1 K/uL   Immature Granulocytes 1 %   Abs Immature Granulocytes 0.08 (H) 0.00 - 0.07 K/uL  CMP (Cancer Center only)  Result Value Ref Range   Sodium 141 135 - 145 mmol/L   Potassium 4.3 3.5 - 5.1 mmol/L   Chloride 98 98 - 111 mmol/L   CO2 31 22 - 32 mmol/L   Glucose, Bld 178 (H) 70 - 99 mg/dL   BUN 42 (H) 8 - 23 mg/dL   Creatinine 2.95 (H) 2.84 - 1.24 mg/dL   Calcium 13.2 (H) 8.9 - 10.3 mg/dL   Total Protein 7.5 6.5 - 8.1 g/dL   Albumin 4.4 3.5 - 5.0 g/dL   AST 24 15 - 41 U/L   ALT 20 0 - 44 U/L   Alkaline Phosphatase 68 38 - 126 U/L   Total Bilirubin 0.6 <1.2 mg/dL   GFR, Estimated 37 (L) >60 mL/min   Anion gap 12 5 - 15  Lactate dehydrogenase (LDH)  Result Value Ref Range   LDH 169 98 - 192 U/L    Last CBC Lab Results  Component Value Date   WBC 9.4 06/09/2023   HGB 15.0 06/09/2023   HCT 45.9  06/09/2023   MCV 91.4 06/09/2023   MCH 29.9 06/09/2023   RDW 15.8 (H) 06/09/2023   PLT 215 06/09/2023   Last metabolic panel Lab Results  Component Value Date   GLUCOSE 178 (H) 06/09/2023   NA 141 06/09/2023   K 4.3 06/09/2023   CL 98 06/09/2023   CO2 31 06/09/2023   BUN 42 (H) 06/09/2023   CREATININE 1.80 (H) 06/09/2023   GFRNONAA 37 (L) 06/09/2023   CALCIUM 10.8 (H) 06/09/2023   PHOS 3.0 08/17/2017   PROT 7.5 06/09/2023   ALBUMIN 4.4 06/09/2023   LABGLOB 2.8 07/27/2018   AGRATIO 1.5 07/27/2018   BILITOT 0.6 06/09/2023   ALKPHOS 68 06/09/2023   AST 24 06/09/2023   ALT 20 06/09/2023   ANIONGAP 12 06/09/2023   Last lipids Lab Results  Component Value Date   CHOL 182 04/10/2023   HDL 32.50 (L) 04/10/2023   LDLCALC 80 04/10/2023   LDLDIRECT 78.0 11/26/2022   TRIG 347.0 (H) 04/10/2023   CHOLHDL 6 04/10/2023   Last hemoglobin A1c Lab Results  Component Value Date   HGBA1C 9.1 (H) 04/10/2023   Last thyroid functions Lab Results  Component Value Date   TSH 2.450 07/27/2018   Last vitamin D Lab Results  Component Value Date   VD25OH 16.1 (L) 07/27/2018   Last vitamin B12 and Folate Lab Results  Component Value Date   VITAMINB12 302 04/07/2023   FOLATE 9.8 08/18/2017      The ASCVD Risk score (Arnett DK, et al., 2019) failed to calculate for the following reasons:   The 2019 ASCVD  risk score is only valid for ages 71 to 1   The patient has a prior MI or stroke diagnosis    Assessment & Plan:   Problem List Items Addressed This Visit       Unprioritized   Impacted cerumen of left ear - Primary    Peroxide and water used to irrigate ear Return to office as needed        No follow-ups on file.    Donato Schultz, DO

## 2023-06-09 NOTE — Progress Notes (Unsigned)
Pt had PICC line D/C last week. Pt has no port-a-cath either

## 2023-06-09 NOTE — Progress Notes (Signed)
Hematology and Oncology Follow Up Visit  Richard [name] [id number] 16-Aug-1940 82 y.o. 06/09/2023   Principle Diagnosis:  Diffuse large cell non-Hodgkin's lymphoma (IPI = 3) - NOT "double hit" Pernicious anemia Iron deficiency secondary to bleeding   Past Therapy: R-CHOP - s/p cycle 8 - completed 08/2017   Current Therapy:        Vitamin B12 1 mg IM every month Xgeva 120 mg subcu q 3 months - next dose due in  08/2023 IV iron as indicated   Interim History:  Richard [name] is here today for follow-up and B 12 injection.   He had a new pacemaker placed on 04/01/2023. Venogram occurred on 03/14/2023. He has an infection that is suspected to be due to his pacemaker replacement. He was septic and spent 2 weeks inpatient at [facility]. While being evaluated for all of this, he had a CT of his chest and abdomen obtained. There was a new left paratracheal and right inguinal lymph node that were shown. In addition a cystic area of his pancreatic tail appeared more prominent compared to prior studies. He was discharged to [facility] and recently has been discharged back to home.   He reports that he is doing really well.  He has no concerns at this time No dental concerns or upcoming dental procedures He has not noted any blood loss. No petechiae.  No fever, chills, n/v, cough, rash, dizziness, SOB, chest pain, palpitations, abdominal pain or changes in bowel or bladder habits.  No falls or syncope.  Appetite and hydration are good.  Wt Readings from Last 3 Encounters:  06/09/23 233 lb 4 oz (105.8 kg)  06/09/23 232 lb 1.9 oz (105.3 kg)  06/06/23 236 lb 6.4 oz (107.2 kg)   ECOG Performance Status: 1 - Symptomatic but completely ambulatory  Medications:  Allergies as of 06/09/2023       Reactions   Ace Inhibitors Swelling, Other (See Comments)   Angioedema   Benazepril Swelling, Other (See Comments)   Angioedema; he is not a candidate for any angiotensin receptor  blockers because of this significant allergic reaction. Because of a history of documented adverse serious drug reaction;Medi Alert bracelet  is recommended   Entresto [sacubitril-valsartan] Swelling, Other (See Comments)   First-in-Class Angiotensin Receptor Neprilysin Inhibitor- Med was "red-flagged" by the patient's pharmacy for him to NOT take!   Hctz [hydrochlorothiazide] Anaphylaxis, Swelling   Tongue and lip swelling   Aspirin Other (See Comments)   Gastritis, can aspirin not take 325 mg aspirin        Medication List        Accurate as of June 09, 2023  4:34 PM. If you have any questions, ask your nurse or doctor.          STOP taking these medications    ampicillin 2 g in sodium chloride 0.9 % 100 mL Stopped by: Rushie Chestnut   cefTRIAXone 2 g in sodium chloride 0.9 % 100 mL Stopped by: Rushie Chestnut       TAKE these medications    acetaminophen 325 MG tablet Commonly known as: TYLENOL Take 650 mg by mouth at bedtime as needed for moderate pain (pain score 4-6) or headache. What changed: Another medication with the same name was removed. Continue taking this medication, and follow the directions you see here. Changed by: Rushie Chestnut   allopurinol 300 MG tablet Commonly known as: ZYLOPRIM Take 300 mg by mouth daily.   aluminum hydroxide-magnesium carbonate  95-358 MG/15ML Susp Commonly known as: GAVISCON Take 15 mLs by mouth as needed for indigestion or heartburn.   amoxicillin 500 MG capsule Commonly known as: AMOXIL Take 500 mg by mouth 3 (three) times daily.   atorvastatin 80 MG tablet Commonly known as: LIPITOR TAKE 1 TABLET BY MOUTH EVERYDAY AT BEDTIME   Azelastine HCl 137 MCG/SPRAY Soln PLACE 2 SPRAYS INTO BOTH NOSTRILS AT BEDTIME AS NEEDED FOR RHINITIS OR ALLERGIES.   CO Q-10 PO Take 100 mg by mouth daily.   Eliquis 2.5 MG Tabs tablet Generic drug: apixaban TAKE 1 TABLET BY MOUTH TWICE A DAY   esomeprazole 40 MG  capsule Commonly known as: NexIUM Take 1 capsule (40 mg total) by mouth daily.   ezetimibe 10 MG tablet Commonly known as: ZETIA Take 1 tablet (10 mg total) by mouth daily.   Farxiga 10 MG Tabs tablet Generic drug: dapagliflozin propanediol Take 10 mg by mouth in the morning.   fenofibrate 160 MG tablet Take 1 tablet (160 mg total) by mouth daily.   folic acid 1 MG tablet Commonly known as: FOLVITE TAKE 2 TABLETS BY MOUTH EVERY DAY   freestyle lancets USE ONCE A DAY TO CHECK BLOOD SUGAR.   FREESTYLE LITE test strip Generic drug: glucose blood USE TO TEST BLOOD SUGAR ONCE A DAY. DX CODE: E11.9   gabapentin 100 MG capsule Commonly known as: NEURONTIN Take 100 mg by mouth at bedtime. PRN   glimepiride 2 MG tablet Commonly known as: AMARYL Take 1 tablet (2 mg total) by mouth in the morning and at bedtime.   icosapent Ethyl 1 g capsule Commonly known as: Vascepa Take 2 capsules (2 g total) by mouth 2 (two) times daily.   ICY HOT ADVANCED PAIN RELIEF EX Apply 1 application  topically daily as needed (to painful sites).   Klor-Con M20 20 MEQ tablet Generic drug: potassium chloride SA TAKE 2 TABLETS BY MOUTH DAILY   metoprolol succinate 25 MG 24 hr tablet Commonly known as: TOPROL-XL TAKE 1 TABLET (25 MG TOTAL) BY MOUTH DAILY.   nitroGLYCERIN 0.4 MG SL tablet Commonly known as: NITROSTAT PLACE 1 TABLET UNDER THE TONGUE EVERY 5 MINUTES AS NEEDED FOR CHEST PAIN.   omeprazole 20 MG capsule Commonly known as: PRILOSEC Take 20 mg by mouth daily.   pregabalin 100 MG capsule Commonly known as: Lyrica Take 1 capsule (100 mg total) by mouth daily. At noon   pregabalin 150 MG capsule Commonly known as: LYRICA Take 1 capsule (150 mg total) by mouth at bedtime. (Take 100mg  daily at noon)   psyllium 58.6 % powder Commonly known as: METAMUCIL Take 1 packet by mouth daily as needed (for constipation- mix and drink).   senna 8.6 MG Tabs tablet Commonly known as:  SENOKOT Take 2 tablets (17.2 mg total) by mouth at bedtime. This is for constipation.  Stop taking if you start having diarrhea.   torsemide 20 MG tablet Commonly known as: DEMADEX TAKE 2 TAB BY MOUTH IN MORNING TAKE EXTRA TABLET FOR WEIGHT GAIN MORE THAN 2-3LBS IN 24HR FOR 90DAY   Trulicity 3 MG/0.5ML Soaj Generic drug: Dulaglutide Inject 3 mg into the skin once a week.   VITAMIN C PO Take 1 tablet by mouth daily with breakfast.        Allergies:  Allergies  Allergen Reactions   Ace Inhibitors Swelling and Other (See Comments)    Angioedema    Benazepril Swelling and Other (See Comments)    Angioedema; he is not  a candidate for any angiotensin receptor blockers because of this significant allergic reaction. Because of a history of documented adverse serious drug reaction;Medi Alert bracelet  is recommended   Entresto [Sacubitril-Valsartan] Swelling and Other (See Comments)    First-in-Class Angiotensin Receptor Neprilysin Inhibitor- Med was "red-flagged" by the patient's pharmacy for him to NOT take!   Hctz [Hydrochlorothiazide] Anaphylaxis and Swelling    Tongue and lip swelling    Aspirin Other (See Comments)    Gastritis, can aspirin not take 325 mg aspirin    Past Medical History, Surgical history, Social history, and Family History were reviewed and updated.  Review of Systems: All other 10 point review of systems is negative.   Physical Exam:  height is 6' (1.829 m) and weight is 232 lb 1.9 oz (105.3 kg). His oral temperature is 97.7 F (36.5 C). His blood pressure is 115/61 and his pulse is 64. His respiration is 20 and oxygen saturation is 95%.   Wt Readings from Last 3 Encounters:  06/09/23 233 lb 4 oz (105.8 kg)  06/09/23 232 lb 1.9 oz (105.3 kg)  06/06/23 236 lb 6.4 oz (107.2 kg)    Ocular: Sclerae unicteric, pupils equal, round and reactive to light Ear-nose-throat: Oropharynx clear, dentition fair Lymphatic: No cervical or supraclavicular  adenopathy Lungs no rales or rhonchi, good excursion bilaterally Heart regular rate and rhythm, no murmur appreciated Abd soft, nontender, positive bowel sounds MSK no focal spinal tenderness, no joint edema Neuro: non-focal, well-oriented, appropriate affect   Lab Results  Component Value Date   WBC 9.4 06/09/2023   HGB 15.0 06/09/2023   HCT 45.9 06/09/2023   MCV 91.4 06/09/2023   PLT 215 06/09/2023   Lab Results  Component Value Date   FERRITIN 1,424 (H) 05/08/2023   IRON 81 05/08/2023   TIBC 290 05/08/2023   UIBC 209 05/08/2023   IRONPCTSAT 28 05/08/2023   Lab Results  Component Value Date   RETICCTPCT 1.7 04/07/2023   RBC 5.02 06/09/2023   RETICCTABS 52.1 11/22/2011   No results found for: "KPAFRELGTCHN", "LAMBDASER", "[facility]" Lab Results  Component Value Date   IGA 159 03/09/2012   Lab Results  Component Value Date   ALBUMINELP 4.2 07/27/2018   MSPIKE Not Observed 07/27/2018     Chemistry      Component Value Date/Time   NA 141 06/09/2023 1334   NA 138 02/08/2022 0811   NA 144 06/27/2017 0857   K 4.3 06/09/2023 1334   K 3.9 06/27/2017 0857   CL 98 06/09/2023 1334   CL 103 06/27/2017 0857   CO2 31 06/09/2023 1334   CO2 26 06/27/2017 0857   BUN 42 (H) 06/09/2023 1334   BUN 46 (H) 02/08/2022 0811   BUN 12 06/27/2017 0857   CREATININE 1.80 (H) 06/09/2023 1334   CREATININE 1.65 (H) 04/18/2020 0956      Component Value Date/Time   CALCIUM 10.8 (H) 06/09/2023 1334   CALCIUM 9.2 06/27/2017 0857   ALKPHOS 68 06/09/2023 1334   ALKPHOS 128 (H) 06/27/2017 0857   AST 24 06/09/2023 1334   ALT 20 06/09/2023 1334   ALT 24 06/27/2017 0857   BILITOT 0.6 06/09/2023 1334      Encounter Diagnoses  Name Primary?   Iron deficiency anemia secondary to inadequate dietary iron intake Yes   Anemia due to vitamin B12 deficiency, unspecified B12 deficiency type    Other iron deficiency anemia    Enlarged lymph nodes     Impression and Plan: Mr.  [name] is a  very pleasant 82 yo caucasian gentleman with diffuse large B-cell lymphoma (not "double hit" lymphoma). He completed treatment in February 2019. He is now on Xgeva every 3 months along with PRN IV iron and IM B12.   CBC and CMP show complications of his CKD. He will continue his close flow up with nephrology.  PET from 05/23/2023 shows no evidence of lymphoma recurrence  Iron studies pending. Will replace if needed B 12 given today.  RTC 1 month MD, labs (CBC w/, CMP, iron, ferritin, LDH, B12), B12  Rushie Chestnut, PA-C 12/9/20244:34 PM

## 2023-06-09 NOTE — Assessment & Plan Note (Signed)
Peroxide and water used to irrigate ear Return to office as needed

## 2023-06-09 NOTE — Patient Instructions (Signed)
 Vitamin B12 Injection What is this medication? Vitamin B12 (VAHY tuh min B12) prevents and treats low vitamin B12 levels in your body. It is used in people who do not get enough vitamin B12 from their diet or when their digestive tract does not absorb enough. Vitamin B12 plays an important role in maintaining the health of your nervous system and red blood cells. This medicine may be used for other purposes; ask your health care provider or pharmacist if you have questions. COMMON BRAND NAME(S): B-12 Compliance Kit, B-12 Injection Kit, Cyomin, Dodex, LA-12, Nutri-Twelve, Physicians EZ Use B-12, Primabalt, Vitamin Deficiency Injectable System - B12 What should I tell my care team before I take this medication? They need to know if you have any of these conditions: Kidney disease Leber's disease Megaloblastic anemia An unusual or allergic reaction to cyanocobalamin, cobalt, other medications, foods, dyes, or preservatives Pregnant or trying to get pregnant Breast-feeding How should I use this medication? This medication is injected into a muscle or deeply under the skin. It is usually given in a clinic or care team's office. However, your care team may teach you how to inject yourself. Follow all instructions. Talk to your care team about the use of this medication in children. Special care may be needed. Overdosage: If you think you have taken too much of this medicine contact a poison control center or emergency room at once. NOTE: This medicine is only for you. Do not share this medicine with others. What if I miss a dose? If you are given your dose at a clinic or care team's office, call to reschedule your appointment. If you give your own injections, and you miss a dose, take it as soon as you can. If it is almost time for your next dose, take only that dose. Do not take double or extra doses. What may interact with this medication? Alcohol Colchicine This list may not describe all possible  interactions. Give your health care provider a list of all the medicines, herbs, non-prescription drugs, or dietary supplements you use. Also tell them if you smoke, drink alcohol, or use illegal drugs. Some items may interact with your medicine. What should I watch for while using this medication? Visit your care team regularly. You may need blood work done while you are taking this medication. You may need to follow a special diet. Talk to your care team. Limit your alcohol intake and avoid smoking to get the best benefit. What side effects may I notice from receiving this medication? Side effects that you should report to your care team as soon as possible: Allergic reactions--skin rash, itching, hives, swelling of the face, lips, tongue, or throat Swelling of the ankles, hands, or feet Trouble breathing Side effects that usually do not require medical attention (report to your care team if they continue or are bothersome): Diarrhea This list may not describe all possible side effects. Call your doctor for medical advice about side effects. You may report side effects to FDA at 1-800-FDA-1088. Where should I keep my medication? Keep out of the reach of children. Store at room temperature between 15 and 30 degrees C (59 and 85 degrees F). Protect from light. Throw away any unused medication after the expiration date. NOTE: This sheet is a summary. It may not cover all possible information. If you have questions about this medicine, talk to your doctor, pharmacist, or health care provider.  2024 Elsevier/Gold Standard (2021-02-27 00:00:00)

## 2023-06-10 LAB — IRON AND IRON BINDING CAPACITY (CC-WL,HP ONLY)
Iron: 91 ug/dL (ref 45–182)
Saturation Ratios: 25 % (ref 17.9–39.5)
TIBC: 358 ug/dL (ref 250–450)
UIBC: 267 ug/dL (ref 117–376)

## 2023-06-16 ENCOUNTER — Ambulatory Visit (INDEPENDENT_AMBULATORY_CARE_PROVIDER_SITE_OTHER): Payer: Medicare Other | Admitting: Pharmacist

## 2023-06-16 DIAGNOSIS — E1169 Type 2 diabetes mellitus with other specified complication: Secondary | ICD-10-CM

## 2023-06-16 DIAGNOSIS — E785 Hyperlipidemia, unspecified: Secondary | ICD-10-CM

## 2023-06-16 DIAGNOSIS — I129 Hypertensive chronic kidney disease with stage 1 through stage 4 chronic kidney disease, or unspecified chronic kidney disease: Secondary | ICD-10-CM

## 2023-06-16 DIAGNOSIS — I5042 Chronic combined systolic (congestive) and diastolic (congestive) heart failure: Secondary | ICD-10-CM

## 2023-06-16 DIAGNOSIS — E1122 Type 2 diabetes mellitus with diabetic chronic kidney disease: Secondary | ICD-10-CM

## 2023-06-16 DIAGNOSIS — N183 Chronic kidney disease, stage 3 unspecified: Secondary | ICD-10-CM

## 2023-06-16 NOTE — Progress Notes (Signed)
Pharmacy Note  06/16/2023 Name: Richard [name] MRN: [id number] DOB: Mar 25, 1941  Subjective: Richard [name] is a 82 y.o. year old male who is a primary care patient of Zola Button, Grayling Congress, DO. Clinical Pharmacist Practitioner referral was placed to assist with medication and diabetes management.    Engaged with patient by telephone for follow up visit today.  He was seen last week for an ear cleaning. He states this has been very helpful and he can hear so much better.   Type 2 DM -  Current therapy - Trulicity 3mg  weekly, glimepiride 2mg  twice a day and Farxiga 10mg  daily.  Past therapies: metformin stopped due to rising Scr.  Initially recommended Ozempic instead of Trulicity but Trulicity was preferred by patient's insurance.   Tolerating Trulicity well.  Denies any hypoglycemia events since blood glucose was in the 50's when during the time he had infection.   Wt Readings from Last 3 Encounters:  06/09/23 233 lb 4 oz (105.8 kg)  06/09/23 232 lb 1.9 oz (105.3 kg)  06/06/23 236 lb 6.4 oz (107.2 kg)   Weight 1 year ago 10/26/2021 was 251lbs.  (Has lost about 18 lbs since starting GLP1)  Diet - Has been eating a little more sweets due to the holidays. He continues to try to limit potatoes and bread.   Exercise - none currently, only rehab  CHF:  Current medications: torsemide 20mg   - 2 tabs = 40mg  daily, Farxiga 10mg  daily (for DM, CHF and CKD) He takes an extra torsemide if needed for weight gain of more than 2 - 3 lbs in 24 hours or 5lbs in a week.  Reports he has not needed to take an extra tablet in the last 2 ro 3 weeks.  Weighing daily - reports weight has been stable.   Hyperlipidemia:  Current therapy: fenofibrate 160mg , atorvastatin 80mg  daiyl, ezetimibe 10mg  daily and icosappent ethyl - 2 grams twice a day. Enrolled in Hyperlipidemia [employer] funds which covers all his cholesterol medications.   Medication management: He states he has extra medication to  last until Jan or Feb of 2025.   Objective: Review of patient status, including review of consultants reports, laboratory and other test data, was performed as part of comprehensive.  Lab Results  Component Value Date   CREATININE 1.80 (H) 06/09/2023   CREATININE 1.68 (H) 05/08/2023   CREATININE 1.49 (H) 04/16/2023    Lab Results  Component Value Date   HGBA1C 9.1 (H) 04/10/2023       Component Value Date/Time   CHOL 182 04/10/2023 1020   CHOL 162 11/23/2020 0828   TRIG 347.0 (H) 04/10/2023 1020   HDL 32.50 (L) 04/10/2023 1020   HDL 38 (L) 11/23/2020 0828   CHOLHDL 6 04/10/2023 1020   VLDL 69.4 (H) 04/10/2023 1020   LDLCALC 80 04/10/2023 1020   LDLCALC 89 11/23/2020 0828   LDLCALC  04/18/2020 0956     Comment:     . LDL cholesterol not calculated. Triglyceride levels greater than 400 mg/dL invalidate calculated LDL results. . Reference range: <100 . Desirable range <100 mg/dL for primary prevention;   <70 mg/dL for patients with CHD or diabetic patients  with > or = 2 CHD risk factors. [name] LDL-C is now calculated using the Martin-Hopkins  calculation, which is a validated novel method providing  better accuracy than the Friedewald equation in the  estimation of LDL-C.  Horald Pollen et al. Lenox Ahr. 5284;132(44): 2061-2068  (http://education.[employer].com/faq/FAQ164)  LDLDIRECT 78.0 11/26/2022 1006     Clinical ASCVD: Yes  The ASCVD Risk score (Arnett DK, et al., 2019) failed to calculate for the following reasons:   The 2019 ASCVD risk score is only valid for ages 99 to 49   Risk score cannot be calculated because patient has a medical history suggesting prior/existing ASCVD    BP Readings from Last 3 Encounters:  06/09/23 131/79  06/09/23 115/61  06/06/23 128/78     Allergies  Allergen Reactions   Ace Inhibitors Swelling and Other (See Comments)    Angioedema    Benazepril Swelling and Other (See Comments)    Angioedema; he is not a candidate for  any angiotensin receptor blockers because of this significant allergic reaction. Because of a history of documented adverse serious drug reaction;Medi Alert bracelet  is recommended   Entresto [Sacubitril-Valsartan] Swelling and Other (See Comments)    First-in-Class Angiotensin Receptor Neprilysin Inhibitor- Med was "red-flagged" by the patient's pharmacy for him to NOT take!   Hctz [Hydrochlorothiazide] Anaphylaxis and Swelling    Tongue and lip swelling    Aspirin Other (See Comments)    Gastritis, can aspirin not take 325 mg aspirin    Medications Reviewed Today   Medications were not reviewed in this encounter     Patient Active Problem List   Diagnosis Date Noted   Sepsis (HCC) 04/15/2023   Permanent atrial fibrillation (HCC) 10/23/2022   Simple chronic bronchitis (HCC) 06/03/2022   Cellulitis of lower extremity 04/30/2022   Type 2 DM with CKD stage 3 and hypertension (HCC) 04/30/2022   Cellulitis of right leg 04/24/2022   Stage 3b chronic kidney disease (CKD) (HCC) 04/24/2022   Left upper quadrant pain 04/08/2022   Subacute cough 04/08/2022   Chronic combined systolic and diastolic heart failure (HCC) 02/06/2022   Acute exacerbation of CHF (congestive heart failure) (HCC) 01/16/2022   Hemoptysis 12/26/2021   Subclavian vein stenosis 12/23/2021   Strep throat 11/16/2021   Multiple superficial wounds with infection 01/04/2021   Cardiac pacemaker in situ 11/20/2020   S/P CABG x 4 11/20/2020   Ascending aortic aneurysm (HCC) 11/20/2020   Senile purpura (HCC) 07/27/2020   Porokeratosis 06/20/2020   Pain due to onychomycosis of toenails of both feet 12/14/2019   Educated about COVID-19 virus infection 11/25/2019   DM2 (diabetes mellitus, type 2) (HCC) 08/10/2019   Hyperlipidemia associated with type 2 diabetes mellitus (HCC) 08/10/2019   S/P left TKA 06/29/2019   Peripheral neuropathy 07/27/2018   Uncontrolled type 2 diabetes mellitus with hyperglycemia (HCC) 05/13/2018    Diabetic peripheral neuropathy associated with type 2 diabetes mellitus (HCC) 05/13/2018   Pansinusitis 05/13/2018   Severe aortic stenosis    Complete heart block (HCC)    Paroxysmal atrial fibrillation (HCC)    Acute on chronic diastolic CHF (congestive heart failure), NYHA class 2 (HCC)    Left arm swelling    Status post transcatheter aortic valve replacement (TAVR) using bioprosthesis 09/09/2017   Demand ischemia of myocardium (HCC)    Typical atrial flutter (HCC)    Acute on chronic diastolic heart failure (HCC)    GIB (gastrointestinal bleeding) 08/16/2017   Hyperlipidemia 08/05/2017   NSTEMI (non-ST elevated myocardial infarction) (HCC)    Aortic stenosis, severe 07/25/2017   Pancytopenia (HCC) 07/24/2017   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) 02/27/2017   Bradycardia 01/04/2017   Coronary artery disease 01/04/2017   Stenosis of carotid artery 01/04/2017   Obesity 09/18/2016   Impacted  cerumen of left ear 03/20/2015   Wenckebach block    Disorder associated with type II diabetes mellitus (HCC) 05/21/2013   Spinal stenosis of lumbar region 04/08/2013   Anemia, iron deficiency 11/29/2011   B12 deficiency anemia 07/30/2011   OSA (obstructive sleep apnea) 07/06/2010   CAD, ARTERY BYPASS GRAFT 08/22/2009   Essential hypertension 03/29/2009   Bilateral lower extremity edema 02/21/2009   Hyperlipidemia LDL goal <70 11/26/2008   GOUT 08/17/2006     Medication Assistance:   High med cost when in coverage gap. Screened for medication assistance program in past - patient did qualify. [employer] for hypercholesterolemia. Approved for $2500 thru 06/02/2024    Patient reports he has gotten out of Medicare coverage gap and is now in catastrophic coverage phase. Medications are $0 to $10.   Assessment / Plan: Type 2 DM - A1c now 7.1% Continue Trulicity 3mg  weekly, glimepiride 2mg  twice a day and Farxiga 10mg  daily.  Continue to check blood  glucose once a day Reviewed s/s of hypoglycemia and how to treat.   CHF:  Continue to take torsemide 40mg  daily and extra torsemide as needed.  Continue to check weight daily.  Continue to take [place].   Medication management:  Reviewed and updated medication list  Reviewed refill history and adherence.  Discussed 2025 changes in Medicare. Patient will have a $590 deductible and the cost of Eliquis, Trulicity and Marcelline Deist will be high to start by he will reach $2000 out of pocket max around May and then rest of year medication cost will be $0.  Assisted patient in reapplying for [employer] for hypercholesterolemia (generic Vescepa, atorvastatin and ezetimibe)    Follow Up:  Telephone follow up appointment with care management team member scheduled for:  1 to 2 months   Henrene Pastor, PharmD Clinical Pharmacist [facility]  - [facility] [phone]

## 2023-06-17 DIAGNOSIS — Z792 Long term (current) use of antibiotics: Secondary | ICD-10-CM | POA: Diagnosis not present

## 2023-06-17 DIAGNOSIS — Z952 Presence of prosthetic heart valve: Secondary | ICD-10-CM | POA: Diagnosis not present

## 2023-06-17 DIAGNOSIS — R7881 Bacteremia: Secondary | ICD-10-CM | POA: Diagnosis not present

## 2023-06-17 DIAGNOSIS — Z950 Presence of cardiac pacemaker: Secondary | ICD-10-CM | POA: Diagnosis not present

## 2023-06-17 DIAGNOSIS — A498 Other bacterial infections of unspecified site: Secondary | ICD-10-CM | POA: Diagnosis not present

## 2023-07-07 DIAGNOSIS — Z950 Presence of cardiac pacemaker: Secondary | ICD-10-CM | POA: Diagnosis not present

## 2023-07-07 DIAGNOSIS — T82897D Other specified complication of cardiac prosthetic devices, implants and grafts, subsequent encounter: Secondary | ICD-10-CM | POA: Diagnosis not present

## 2023-07-07 DIAGNOSIS — I429 Cardiomyopathy, unspecified: Secondary | ICD-10-CM | POA: Diagnosis not present

## 2023-07-07 DIAGNOSIS — I428 Other cardiomyopathies: Secondary | ICD-10-CM | POA: Diagnosis not present

## 2023-07-07 DIAGNOSIS — I442 Atrioventricular block, complete: Secondary | ICD-10-CM | POA: Diagnosis not present

## 2023-07-07 DIAGNOSIS — I480 Paroxysmal atrial fibrillation: Secondary | ICD-10-CM | POA: Diagnosis not present

## 2023-07-07 DIAGNOSIS — I4892 Unspecified atrial flutter: Secondary | ICD-10-CM | POA: Diagnosis not present

## 2023-07-07 DIAGNOSIS — T82897A Other specified complication of cardiac prosthetic devices, implants and grafts, initial encounter: Secondary | ICD-10-CM | POA: Diagnosis not present

## 2023-07-08 DIAGNOSIS — I442 Atrioventricular block, complete: Secondary | ICD-10-CM | POA: Diagnosis not present

## 2023-07-10 ENCOUNTER — Other Ambulatory Visit: Payer: Self-pay | Admitting: *Deleted

## 2023-07-10 DIAGNOSIS — D510 Vitamin B12 deficiency anemia due to intrinsic factor deficiency: Secondary | ICD-10-CM

## 2023-07-10 DIAGNOSIS — D508 Other iron deficiency anemias: Secondary | ICD-10-CM

## 2023-07-10 DIAGNOSIS — D519 Vitamin B12 deficiency anemia, unspecified: Secondary | ICD-10-CM

## 2023-07-11 ENCOUNTER — Inpatient Hospital Stay: Payer: Medicare Other | Attending: Hematology & Oncology

## 2023-07-11 ENCOUNTER — Inpatient Hospital Stay: Payer: Medicare Other | Admitting: Family

## 2023-07-11 ENCOUNTER — Inpatient Hospital Stay: Payer: Medicare Other

## 2023-07-11 ENCOUNTER — Inpatient Hospital Stay (HOSPITAL_BASED_OUTPATIENT_CLINIC_OR_DEPARTMENT_OTHER): Payer: Medicare Other | Admitting: Family

## 2023-07-11 ENCOUNTER — Encounter: Payer: Self-pay | Admitting: Family

## 2023-07-11 VITALS — BP 121/58 | HR 84 | Temp 97.9°F | Resp 20 | Ht 72.0 in | Wt 235.0 lb

## 2023-07-11 DIAGNOSIS — D508 Other iron deficiency anemias: Secondary | ICD-10-CM

## 2023-07-11 DIAGNOSIS — D510 Vitamin B12 deficiency anemia due to intrinsic factor deficiency: Secondary | ICD-10-CM

## 2023-07-11 DIAGNOSIS — Z79899 Other long term (current) drug therapy: Secondary | ICD-10-CM | POA: Insufficient documentation

## 2023-07-11 DIAGNOSIS — R5383 Other fatigue: Secondary | ICD-10-CM | POA: Insufficient documentation

## 2023-07-11 DIAGNOSIS — E611 Iron deficiency: Secondary | ICD-10-CM | POA: Diagnosis not present

## 2023-07-11 DIAGNOSIS — D513 Other dietary vitamin B12 deficiency anemia: Secondary | ICD-10-CM

## 2023-07-11 DIAGNOSIS — D519 Vitamin B12 deficiency anemia, unspecified: Secondary | ICD-10-CM

## 2023-07-11 DIAGNOSIS — Z950 Presence of cardiac pacemaker: Secondary | ICD-10-CM | POA: Insufficient documentation

## 2023-07-11 DIAGNOSIS — C8332 Diffuse large B-cell lymphoma, intrathoracic lymph nodes: Secondary | ICD-10-CM | POA: Insufficient documentation

## 2023-07-11 LAB — CBC WITH DIFFERENTIAL (CANCER CENTER ONLY)
Abs Immature Granulocytes: 0.05 10*3/uL (ref 0.00–0.07)
Basophils Absolute: 0 10*3/uL (ref 0.0–0.1)
Basophils Relative: 1 %
Eosinophils Absolute: 1.1 10*3/uL — ABNORMAL HIGH (ref 0.0–0.5)
Eosinophils Relative: 14 %
HCT: 46.3 % (ref 39.0–52.0)
Hemoglobin: 14.9 g/dL (ref 13.0–17.0)
Immature Granulocytes: 1 %
Lymphocytes Relative: 21 %
Lymphs Abs: 1.7 10*3/uL (ref 0.7–4.0)
MCH: 29.9 pg (ref 26.0–34.0)
MCHC: 32.2 g/dL (ref 30.0–36.0)
MCV: 92.8 fL (ref 80.0–100.0)
Monocytes Absolute: 0.4 10*3/uL (ref 0.1–1.0)
Monocytes Relative: 5 %
Neutro Abs: 4.8 10*3/uL (ref 1.7–7.7)
Neutrophils Relative %: 58 %
Platelet Count: 191 10*3/uL (ref 150–400)
RBC: 4.99 MIL/uL (ref 4.22–5.81)
RDW: 16 % — ABNORMAL HIGH (ref 11.5–15.5)
WBC Count: 8 10*3/uL (ref 4.0–10.5)
nRBC: 0 % (ref 0.0–0.2)

## 2023-07-11 LAB — CMP (CANCER CENTER ONLY)
ALT: 23 U/L (ref 0–44)
AST: 24 U/L (ref 15–41)
Albumin: 4.4 g/dL (ref 3.5–5.0)
Alkaline Phosphatase: 65 U/L (ref 38–126)
Anion gap: 9 (ref 5–15)
BUN: 37 mg/dL — ABNORMAL HIGH (ref 8–23)
CO2: 32 mmol/L (ref 22–32)
Calcium: 10.1 mg/dL (ref 8.9–10.3)
Chloride: 101 mmol/L (ref 98–111)
Creatinine: 1.96 mg/dL — ABNORMAL HIGH (ref 0.61–1.24)
GFR, Estimated: 33 mL/min — ABNORMAL LOW (ref >60)
Glucose, Bld: 261 mg/dL — ABNORMAL HIGH (ref 70–99)
Potassium: 4.5 mmol/L (ref 3.5–5.1)
Sodium: 142 mmol/L (ref 135–145)
Total Bilirubin: 0.5 mg/dL (ref 0.0–1.2)
Total Protein: 6.9 g/dL (ref 6.5–8.1)

## 2023-07-11 MED ORDER — CYANOCOBALAMIN 1000 MCG/ML IJ SOLN
1000.0000 ug | Freq: Once | INTRAMUSCULAR | Status: AC
Start: 2023-07-11 — End: 2023-07-11
  Administered 2023-07-11: 1000 ug via INTRAMUSCULAR
  Filled 2023-07-11: qty 1

## 2023-07-11 NOTE — Progress Notes (Signed)
 Hematology and Oncology Follow Up Visit  KYREL LEIGHTON [id number] 1940-09-16 83 y.o. 07/11/2023   Principle Diagnosis:  Diffuse large cell non-Hodgkin's lymphoma (IPI = 3) - NOT double hit Pernicious anemia Iron deficiency secondary to bleeding   Past Therapy: R-CHOP - s/p cycle 8 - completed 08/2017   Current Therapy:        Vitamin B12 1 mg IM every month Xgeva  120 mg subcu q 3 months - next dose due in 08/2023 IV iron as indicated              Interim History:  Mr. Coury is here today for follow-up. He is doing well and is home now after completing rehab at [facility]. He is scheduled for an ablation next week on Tuesday 1/14.  He states that he is on Amoxicillin  TID lifelong prophylaxis after bacterial infection post pacemaker placement turned into sepsis.   He is asymptomatic aside from mild fatigue.  No fever, chills, n/v, cough, rash, dizziness, SOB, chest pain, palpitations, abdominal pain or changes in bowel or bladder habits.   No swelling, tenderness, numbness or tingling in his extremities.  No falls or syncope reported.  Appetite and hydration are good. Weight is stable at 235 lbs.   He plans to gold Monday, Wednesday and Friday the week of his birthday later this month.   ECOG Performance Status: 1 - Symptomatic but completely ambulatory  Medications:  Allergies as of 07/11/2023       Reactions   Ace Inhibitors Swelling, Other (See Comments)   Angioedema   Benazepril Swelling, Other (See Comments)   Angioedema; he is not a candidate for any angiotensin receptor blockers because of this significant allergic reaction. Because of a history of documented adverse serious drug reaction;Medi Alert bracelet  is recommended   Entresto  [sacubitril -valsartan ] Swelling, Other (See Comments)   First-in-Class Angiotensin Receptor Neprilysin Inhibitor- Med was red-flagged by the patient's pharmacy for him to NOT take!   Hctz [hydrochlorothiazide] Anaphylaxis, Swelling    Tongue and lip swelling   Aspirin  Other (See Comments)   Gastritis, can aspirin  not take 325 mg aspirin         Medication List        Accurate as of July 11, 2023  9:28 AM. If you have any questions, ask your nurse or doctor.          acetaminophen  325 MG tablet Commonly known as: TYLENOL  Take 650 mg by mouth at bedtime as needed for moderate pain (pain score 4-6) or headache.   allopurinol  300 MG tablet Commonly known as: ZYLOPRIM  Take 300 mg by mouth daily.   aluminum hydroxide-magnesium  carbonate 95-358 MG/15ML Susp Commonly known as: GAVISCON Take 15 mLs by mouth as needed for indigestion or heartburn.   amoxicillin  500 MG capsule Commonly known as: AMOXIL  Take 500 mg by mouth 3 (three) times daily.   atorvastatin  80 MG tablet Commonly known as: LIPITOR  TAKE 1 TABLET BY MOUTH EVERYDAY AT BEDTIME   Azelastine  HCl 137 MCG/SPRAY Soln PLACE 2 SPRAYS INTO BOTH NOSTRILS AT BEDTIME AS NEEDED FOR RHINITIS OR ALLERGIES.   CO Q-10 PO Take 100 mg by mouth daily.   Eliquis  2.5 MG Tabs tablet Generic drug: apixaban  TAKE 1 TABLET BY MOUTH TWICE A DAY   esomeprazole  40 MG capsule Commonly known as: NexIUM  Take 1 capsule (40 mg total) by mouth daily.   ezetimibe  10 MG tablet Commonly known as: ZETIA  Take 1 tablet (10 mg total) by mouth daily.   Farxiga  10  MG Tabs tablet Generic drug: dapagliflozin  propanediol Take 10 mg by mouth in the morning.   fenofibrate  160 MG tablet Take 1 tablet (160 mg total) by mouth daily.   folic acid  1 MG tablet Commonly known as: FOLVITE  TAKE 2 TABLETS BY MOUTH EVERY DAY   freestyle lancets USE ONCE A DAY TO CHECK BLOOD SUGAR.   FREESTYLE LITE test strip Generic drug: glucose blood USE TO TEST BLOOD SUGAR ONCE A DAY. DX CODE: E11.9   gabapentin  100 MG capsule Commonly known as: NEURONTIN  Take 100 mg by mouth at bedtime. PRN   glimepiride  2 MG tablet Commonly known as: AMARYL  Take 1 tablet (2 mg total) by mouth in the  morning and at bedtime.   icosapent  Ethyl 1 g capsule Commonly known as: Vascepa  Take 2 capsules (2 g total) by mouth 2 (two) times daily.   ICY HOT ADVANCED PAIN RELIEF EX Apply 1 application  topically daily as needed (to painful sites).   Klor-Con  M20 20 MEQ tablet Generic drug: potassium chloride  SA TAKE 2 TABLETS BY MOUTH DAILY   metoprolol  succinate 25 MG 24 hr tablet Commonly known as: TOPROL -XL TAKE 1 TABLET (25 MG TOTAL) BY MOUTH DAILY.   nitroGLYCERIN  0.4 MG SL tablet Commonly known as: NITROSTAT  PLACE 1 TABLET UNDER THE TONGUE EVERY 5 MINUTES AS NEEDED FOR CHEST PAIN.   omeprazole 20 MG capsule Commonly known as: PRILOSEC Take 20 mg by mouth daily.   pregabalin  100 MG capsule Commonly known as: Lyrica  Take 1 capsule (100 mg total) by mouth daily. At noon   pregabalin  150 MG capsule Commonly known as: LYRICA  Take 1 capsule (150 mg total) by mouth at bedtime. (Take 100mg  daily at noon)   psyllium 58.6 % powder Commonly known as: METAMUCIL Take 1 packet by mouth daily as needed (for constipation- mix and drink).   senna 8.6 MG Tabs tablet Commonly known as: SENOKOT Take 2 tablets (17.2 mg total) by mouth at bedtime. This is for constipation.  Stop taking if you start having diarrhea.   torsemide  20 MG tablet Commonly known as: DEMADEX  TAKE 2 TAB BY MOUTH IN MORNING TAKE EXTRA TABLET FOR WEIGHT GAIN MORE THAN 2-3LBS IN 24HR FOR 90DAY   Trulicity  3 MG/0.5ML Soaj Generic drug: Dulaglutide  Inject 3 mg into the skin once a week.   VITAMIN C  PO Take 1 tablet by mouth daily with breakfast.        Allergies:  Allergies  Allergen Reactions   Ace Inhibitors Swelling and Other (See Comments)    Angioedema    Benazepril Swelling and Other (See Comments)    Angioedema; he is not a candidate for any angiotensin receptor blockers because of this significant allergic reaction. Because of a history of documented adverse serious drug reaction;Medi Alert bracelet   is recommended   Entresto  [Sacubitril -Valsartan ] Swelling and Other (See Comments)    First-in-Class Angiotensin Receptor Neprilysin Inhibitor- Med was red-flagged by the patient's pharmacy for him to NOT take!   Hctz [Hydrochlorothiazide] Anaphylaxis and Swelling    Tongue and lip swelling    Aspirin  Other (See Comments)    Gastritis, can aspirin  not take 325 mg aspirin     Past Medical History, Surgical history, Social history, and Family History were reviewed and updated.  Review of Systems: All other 10 point review of systems is negative.   Physical Exam:  vitals were not taken for this visit.   Wt Readings from Last 3 Encounters:  06/09/23 233 lb 4 oz (105.8 kg)  06/09/23 232 lb 1.9 oz (105.3 kg)  06/06/23 236 lb 6.4 oz (107.2 kg)    Ocular: Sclerae unicteric, pupils equal, round and reactive to light Ear-nose-throat: Oropharynx clear, dentition fair Lymphatic: No cervical or supraclavicular adenopathy Lungs no rales or rhonchi, good excursion bilaterally Heart regular rate and rhythm, no murmur appreciated Abd soft, nontender, positive bowel sounds MSK no focal spinal tenderness, no joint edema Neuro: non-focal, well-oriented, appropriate affect Breasts: Deferred   Lab Results  Component Value Date   WBC 9.4 06/09/2023   HGB 15.0 06/09/2023   HCT 45.9 06/09/2023   MCV 91.4 06/09/2023   PLT 215 06/09/2023   Lab Results  Component Value Date   FERRITIN 1,477 (H) 06/09/2023   IRON 91 06/09/2023   TIBC 358 06/09/2023   UIBC 267 06/09/2023   IRONPCTSAT 25 06/09/2023   Lab Results  Component Value Date   RETICCTPCT 1.7 04/07/2023   RBC 5.02 06/09/2023   RETICCTABS 52.1 11/22/2011   No results found for: JONATHAN BONG [facility] Lab Results  Component Value Date   IGA 159 03/09/2012   Lab Results  Component Value Date   ALBUMINELP 4.2 07/27/2018   MSPIKE Not Observed 07/27/2018     Chemistry      Component Value Date/Time   NA  141 06/09/2023 1334   NA 138 02/08/2022 0811   NA 144 06/27/2017 0857   K 4.3 06/09/2023 1334   K 3.9 06/27/2017 0857   CL 98 06/09/2023 1334   CL 103 06/27/2017 0857   CO2 31 06/09/2023 1334   CO2 26 06/27/2017 0857   BUN 42 (H) 06/09/2023 1334   BUN 46 (H) 02/08/2022 0811   BUN 12 06/27/2017 0857   CREATININE 1.80 (H) 06/09/2023 1334   CREATININE 1.65 (H) 04/18/2020 0956      Component Value Date/Time   CALCIUM  10.8 (H) 06/09/2023 1334   CALCIUM  9.2 06/27/2017 0857   ALKPHOS 68 06/09/2023 1334   ALKPHOS 128 (H) 06/27/2017 0857   AST 24 06/09/2023 1334   ALT 20 06/09/2023 1334   ALT 24 06/27/2017 0857   BILITOT 0.6 06/09/2023 1334       Impression and Plan: Mr. Tammen is a very pleasant 83 yo caucasian gentleman with diffuse large B-cell lymphoma (not double hit lymphoma). He completed treatment in February 2019.  He is now on Xgeva  every 3 months along with PRN IV iron and IM B12.  We will proceed with B 12 injection today as planned.  Xgeva  due again next month.  Follow-up in 1 month.   Lauraine Pepper, NP 1/10/20259:28 AM

## 2023-07-11 NOTE — Patient Instructions (Signed)
 Vitamin B12 Deficiency Vitamin B12 deficiency occurs when the body does not have enough of this important vitamin. The body needs this vitamin: To make red blood cells. To make DNA. This is the genetic material inside cells. To help the nerves work properly so they can carry messages from the brain to the body. Vitamin B12 deficiency can cause health problems, such as not having enough red blood cells in the blood (anemia). This can lead to nerve damage if untreated. What are the causes? This condition may be caused by: Not eating enough foods that contain vitamin B12. Not having enough stomach acid and digestive fluids to properly absorb vitamin B12 from the food that you eat. Having certain diseases that make it hard to absorb vitamin B12. These diseases include Crohn's disease, chronic pancreatitis, and cystic fibrosis. An autoimmune disorder in which the body does not make enough of a protein (intrinsic factor) within the stomach, resulting in not enough absorption of vitamin B12. Having a surgery in which part of the stomach or small intestine is removed. Taking certain medicines that make it hard for the body to absorb vitamin B12. These include: Heartburn medicines, such as antacids and proton pump inhibitors. Some medicines that are used to treat diabetes. What increases the risk? The following factors may make you more likely to develop a vitamin B12 deficiency: Being an older adult. Eating a vegetarian or vegan diet that does not include any foods that come from animals. Eating a poor diet while you are pregnant. Taking certain medicines. Having alcoholism. What are the signs or symptoms? In some cases, there are no symptoms of this condition. If the condition leads to anemia or nerve damage, various symptoms may occur, such as: Weakness. Tiredness (fatigue). Loss of appetite. Numbness or tingling in your hands and feet. Redness and burning of the tongue. Depression,  confusion, or memory problems. Trouble walking. If anemia is severe, symptoms can include: Shortness of breath. Dizziness. Rapid heart rate. How is this diagnosed? This condition may be diagnosed with a blood test to measure the level of vitamin B12 in your blood. You may also have other tests, including: A group of tests that measure certain characteristics of blood cells (complete blood count, CBC). A blood test to measure intrinsic factor. A procedure where a thin tube with a camera on the end is used to look into your stomach or intestines (endoscopy). Other tests may be needed to discover the cause of the deficiency. How is this treated? Treatment for this condition depends on the cause. This condition may be treated by: Changing your eating and drinking habits, such as: Eating more foods that contain vitamin B12. Drinking less alcohol or no alcohol. Getting vitamin B12 injections. Taking vitamin B12 supplements by mouth (orally). Your health care provider will tell you which dose is best for you. Follow these instructions at home: Eating and drinking  Include foods in your diet that come from animals and contain a lot of vitamin B12. These include: Meats and poultry. This includes beef, pork, chicken, [place], and organ meats, such as liver. Seafood. This includes clams, rainbow trout, salmon, tuna, and haddock. Eggs. Dairy foods such as milk, yogurt, and cheese. Eat foods that have vitamin B12 added to them (are fortified), such as ready-to-eat breakfast cereals. Check the label on the package to see if a food is fortified. The items listed above may not be a complete list of foods and beverages you can eat and drink. Contact a dietitian for  more information. Alcohol use Do not drink alcohol if: Your health care provider tells you not to drink. You are pregnant, may be pregnant, or are planning to become pregnant. If you drink alcohol: Limit how much you have to: 0-1 drink a  day for women. 0-2 drinks a day for men. Know how much alcohol is in your drink. In the [place] one drink equals one 12 oz bottle of beer (355 mL), one 5 oz glass of wine (148 mL), or one 1 oz glass of hard liquor (44 mL). General instructions Get vitamin B12 injections if told to by your health care provider. Take supplements only as told by your health care provider. Follow the directions carefully. Keep all follow-up visits. This is important. Contact a health care provider if: Your symptoms come back. Your symptoms get worse or do not improve with treatment. Get help right away: You develop shortness of breath. You have a rapid heart rate. You have chest pain. You become dizzy or you faint. These symptoms may be an emergency. Get help right away. Call 911. Do not wait to see if the symptoms will go away. Do not drive yourself to the hospital. Summary Vitamin B12 deficiency occurs when the body does not have enough of this important vitamin. Common causes include not eating enough foods that contain vitamin B12, not being able to absorb vitamin B12 from the food that you eat, having a surgery in which part of the stomach or small intestine is removed, or taking certain medicines. Eat foods that have vitamin B12 in them. Treatment may include making a change in the way you eat and drink, getting vitamin B12 injections, or taking vitamin B12 supplements. This information is not intended to replace advice given to you by your health care provider. Make sure you discuss any questions you have with your health care provider. Document Revised: 02/09/2021 Document Reviewed: 02/09/2021 Elsevier Patient Education  2024 [employer].

## 2023-07-15 DIAGNOSIS — Z951 Presence of aortocoronary bypass graft: Secondary | ICD-10-CM | POA: Diagnosis not present

## 2023-07-15 DIAGNOSIS — I480 Paroxysmal atrial fibrillation: Secondary | ICD-10-CM | POA: Diagnosis not present

## 2023-07-15 DIAGNOSIS — I2510 Atherosclerotic heart disease of native coronary artery without angina pectoris: Secondary | ICD-10-CM | POA: Diagnosis not present

## 2023-07-15 DIAGNOSIS — Z953 Presence of xenogenic heart valve: Secondary | ICD-10-CM | POA: Diagnosis not present

## 2023-07-15 DIAGNOSIS — I483 Typical atrial flutter: Secondary | ICD-10-CM | POA: Diagnosis not present

## 2023-07-15 DIAGNOSIS — Z7901 Long term (current) use of anticoagulants: Secondary | ICD-10-CM | POA: Diagnosis not present

## 2023-07-15 DIAGNOSIS — Z01818 Encounter for other preprocedural examination: Secondary | ICD-10-CM | POA: Diagnosis not present

## 2023-07-15 DIAGNOSIS — I4892 Unspecified atrial flutter: Secondary | ICD-10-CM | POA: Diagnosis not present

## 2023-07-15 DIAGNOSIS — Z950 Presence of cardiac pacemaker: Secondary | ICD-10-CM | POA: Diagnosis not present

## 2023-07-23 ENCOUNTER — Telehealth: Payer: Self-pay | Admitting: Pulmonary Disease

## 2023-07-23 DIAGNOSIS — G4733 Obstructive sleep apnea (adult) (pediatric): Secondary | ICD-10-CM

## 2023-07-23 NOTE — Telephone Encounter (Signed)
PT is ready for a new CPAP. States he qual for it and he and Dr. Vassie Loll discussed at last appt. His # is [phone]

## 2023-07-25 NOTE — Telephone Encounter (Signed)
Order placed

## 2023-08-07 DIAGNOSIS — C8330 Diffuse large B-cell lymphoma, unspecified site: Secondary | ICD-10-CM | POA: Diagnosis not present

## 2023-08-07 DIAGNOSIS — I5020 Unspecified systolic (congestive) heart failure: Secondary | ICD-10-CM | POA: Diagnosis not present

## 2023-08-07 DIAGNOSIS — D631 Anemia in chronic kidney disease: Secondary | ICD-10-CM | POA: Diagnosis not present

## 2023-08-07 DIAGNOSIS — N1831 Chronic kidney disease, stage 3a: Secondary | ICD-10-CM | POA: Diagnosis not present

## 2023-08-07 DIAGNOSIS — I129 Hypertensive chronic kidney disease with stage 1 through stage 4 chronic kidney disease, or unspecified chronic kidney disease: Secondary | ICD-10-CM | POA: Diagnosis not present

## 2023-08-08 ENCOUNTER — Inpatient Hospital Stay (HOSPITAL_BASED_OUTPATIENT_CLINIC_OR_DEPARTMENT_OTHER): Payer: Medicare Other | Admitting: Family

## 2023-08-08 ENCOUNTER — Encounter: Payer: Self-pay | Admitting: Family

## 2023-08-08 ENCOUNTER — Inpatient Hospital Stay: Payer: Medicare Other | Attending: Hematology & Oncology

## 2023-08-08 ENCOUNTER — Inpatient Hospital Stay: Payer: Medicare Other

## 2023-08-08 ENCOUNTER — Other Ambulatory Visit: Payer: Self-pay

## 2023-08-08 VITALS — BP 132/74 | HR 81 | Temp 97.9°F | Resp 19 | Wt 236.1 lb

## 2023-08-08 DIAGNOSIS — D508 Other iron deficiency anemias: Secondary | ICD-10-CM

## 2023-08-08 DIAGNOSIS — D518 Other vitamin B12 deficiency anemias: Secondary | ICD-10-CM

## 2023-08-08 DIAGNOSIS — Z79899 Other long term (current) drug therapy: Secondary | ICD-10-CM | POA: Insufficient documentation

## 2023-08-08 DIAGNOSIS — C8332 Diffuse large B-cell lymphoma, intrathoracic lymph nodes: Secondary | ICD-10-CM

## 2023-08-08 DIAGNOSIS — D510 Vitamin B12 deficiency anemia due to intrinsic factor deficiency: Secondary | ICD-10-CM | POA: Insufficient documentation

## 2023-08-08 DIAGNOSIS — D513 Other dietary vitamin B12 deficiency anemia: Secondary | ICD-10-CM

## 2023-08-08 DIAGNOSIS — E611 Iron deficiency: Secondary | ICD-10-CM | POA: Diagnosis not present

## 2023-08-08 DIAGNOSIS — Z950 Presence of cardiac pacemaker: Secondary | ICD-10-CM | POA: Diagnosis not present

## 2023-08-08 DIAGNOSIS — D519 Vitamin B12 deficiency anemia, unspecified: Secondary | ICD-10-CM

## 2023-08-08 LAB — CMP (CANCER CENTER ONLY)
ALT: 19 U/L (ref 0–44)
AST: 22 U/L (ref 15–41)
Albumin: 4.5 g/dL (ref 3.5–5.0)
Alkaline Phosphatase: 59 U/L (ref 38–126)
Anion gap: 9 (ref 5–15)
BUN: 30 mg/dL — ABNORMAL HIGH (ref 8–23)
CO2: 31 mmol/L (ref 22–32)
Calcium: 9.7 mg/dL (ref 8.9–10.3)
Chloride: 100 mmol/L (ref 98–111)
Creatinine: 1.69 mg/dL — ABNORMAL HIGH (ref 0.61–1.24)
GFR, Estimated: 39 mL/min — ABNORMAL LOW (ref >60)
Glucose, Bld: 135 mg/dL — ABNORMAL HIGH (ref 70–99)
Potassium: 4.2 mmol/L (ref 3.5–5.1)
Sodium: 140 mmol/L (ref 135–145)
Total Bilirubin: 0.5 mg/dL (ref 0.0–1.2)
Total Protein: 7.2 g/dL (ref 6.5–8.1)

## 2023-08-08 LAB — CBC WITH DIFFERENTIAL (CANCER CENTER ONLY)
Abs Immature Granulocytes: 0.05 10*3/uL (ref 0.00–0.07)
Basophils Absolute: 0.1 10*3/uL (ref 0.0–0.1)
Basophils Relative: 1 %
Eosinophils Absolute: 0.2 10*3/uL (ref 0.0–0.5)
Eosinophils Relative: 3 %
HCT: 45.7 % (ref 39.0–52.0)
Hemoglobin: 14.8 g/dL (ref 13.0–17.0)
Immature Granulocytes: 1 %
Lymphocytes Relative: 26 %
Lymphs Abs: 2 10*3/uL (ref 0.7–4.0)
MCH: 29.4 pg (ref 26.0–34.0)
MCHC: 32.4 g/dL (ref 30.0–36.0)
MCV: 90.9 fL (ref 80.0–100.0)
Monocytes Absolute: 0.6 10*3/uL (ref 0.1–1.0)
Monocytes Relative: 8 %
Neutro Abs: 4.8 10*3/uL (ref 1.7–7.7)
Neutrophils Relative %: 61 %
Platelet Count: 204 10*3/uL (ref 150–400)
RBC: 5.03 MIL/uL (ref 4.22–5.81)
RDW: 16.5 % — ABNORMAL HIGH (ref 11.5–15.5)
WBC Count: 7.8 10*3/uL (ref 4.0–10.5)
nRBC: 0 % (ref 0.0–0.2)

## 2023-08-08 LAB — IRON AND IRON BINDING CAPACITY (CC-WL,HP ONLY)
Iron: 82 ug/dL (ref 45–182)
Saturation Ratios: 21 % (ref 17.9–39.5)
TIBC: 396 ug/dL (ref 250–450)
UIBC: 314 ug/dL (ref 117–376)

## 2023-08-08 LAB — LAB REPORT - SCANNED
Albumin, Urine POC: 7.4
Creatinine, POC: 31.8 mg/dL
Microalb Creat Ratio: 23

## 2023-08-08 LAB — FERRITIN: Ferritin: 1482 ng/mL — ABNORMAL HIGH (ref 24–336)

## 2023-08-08 LAB — VITAMIN B12: Vitamin B-12: 295 pg/mL (ref 180–914)

## 2023-08-08 MED ORDER — DENOSUMAB 120 MG/1.7ML ~~LOC~~ SOLN
120.0000 mg | Freq: Once | SUBCUTANEOUS | Status: AC
  Administered 2023-08-08: 120 mg via SUBCUTANEOUS
  Filled 2023-08-08: qty 1.7

## 2023-08-08 MED ORDER — CYANOCOBALAMIN 1000 MCG/ML IJ SOLN
1000.0000 ug | Freq: Once | INTRAMUSCULAR | Status: AC
  Administered 2023-08-08: 1000 ug via INTRAMUSCULAR
  Filled 2023-08-08: qty 1

## 2023-08-08 NOTE — Progress Notes (Signed)
 Hematology and Oncology Follow Up Visit  Richard [name] [id number] Sep 05, 1940 83 y.o. 08/08/2023   Principle Diagnosis:  Diffuse large cell non-Hodgkin's lymphoma (IPI = 3) - NOT double hit Pernicious anemia Iron deficiency secondary to bleeding   Past Therapy: R-CHOP - s/p cycle 8 - completed 08/2017   Current Therapy:        Vitamin B12 1 mg IM every month Xgeva  120 mg subcu q 3 months - next dose due in 08/2023 IV iron as indicated   Interim History:  Richard [name] is here today for follow-up, B 12 injection and Xgeva . He is doing well and has no complaints at this time.  He is playing golf 3 days a week and enjoying time with his buddies.  No issues with blood loss. No abnormal bruising, no petechiae.  No fever, chills, n/v, cough, rash, dizziness, SOB, chest pain, palpitations, abdominal pain or changes in bowel or bladder habits.  No swelling, numbness or tingling in his extremities at this time.  No falls or syncope reported.  Appetite and hydration are good. Weight is 236 lbs.   ECOG Performance Status: 1 - Symptomatic but completely ambulatory  Medications:  Allergies as of 08/08/2023       Reactions   Ace Inhibitors Swelling, Other (See Comments)   Angioedema   Benazepril Swelling, Other (See Comments)   Angioedema; he is not a candidate for any angiotensin receptor blockers because of this significant allergic reaction. Because of a history of documented adverse serious drug reaction;Medi Alert bracelet  is recommended   Entresto  [sacubitril -valsartan ] Swelling, Other (See Comments)   First-in-Class Angiotensin Receptor Neprilysin Inhibitor- Med was red-flagged by the patient's pharmacy for him to NOT take!   Hctz [hydrochlorothiazide] Anaphylaxis, Swelling   Tongue and lip swelling   Aspirin  Other (See Comments)   Gastritis, can aspirin  not take 325 mg aspirin         Medication List        Accurate as of August 08, 2023 10:15 AM. If you have any  questions, ask your nurse or doctor.          acetaminophen  325 MG tablet Commonly known as: TYLENOL  Take 650 mg by mouth at bedtime as needed for moderate pain (pain score 4-6) or headache.   allopurinol  300 MG tablet Commonly known as: ZYLOPRIM  Take 450 mg by mouth daily.   aluminum hydroxide-magnesium  carbonate 95-358 MG/15ML Susp Commonly known as: GAVISCON Take 15 mLs by mouth as needed for indigestion or heartburn.   amoxicillin  500 MG capsule Commonly known as: AMOXIL  Take 500 mg by mouth 3 (three) times daily.   atorvastatin  80 MG tablet Commonly known as: LIPITOR  TAKE 1 TABLET BY MOUTH EVERYDAY AT BEDTIME   Azelastine  HCl 137 MCG/SPRAY Soln PLACE 2 SPRAYS INTO BOTH NOSTRILS AT BEDTIME AS NEEDED FOR RHINITIS OR ALLERGIES.   CO Q-10 PO Take 100 mg by mouth daily.   Eliquis  2.5 MG Tabs tablet Generic drug: apixaban  TAKE 1 TABLET BY MOUTH TWICE A DAY   esomeprazole  40 MG capsule Commonly known as: NexIUM  Take 1 capsule (40 mg total) by mouth daily.   ezetimibe  10 MG tablet Commonly known as: ZETIA  Take 1 tablet (10 mg total) by mouth daily.   Farxiga  10 MG Tabs tablet Generic drug: dapagliflozin  propanediol Take 10 mg by mouth in the morning.   fenofibrate  160 MG tablet Take 1 tablet (160 mg total) by mouth daily.   folic acid  1 MG tablet Commonly known as: FOLVITE  TAKE  2 TABLETS BY MOUTH EVERY DAY   freestyle lancets USE ONCE A DAY TO CHECK BLOOD SUGAR.   FREESTYLE LITE test strip Generic drug: glucose blood USE TO TEST BLOOD SUGAR ONCE A DAY. DX CODE: E11.9   gabapentin  100 MG capsule Commonly known as: NEURONTIN  Take 100 mg by mouth at bedtime. PRN   glimepiride  2 MG tablet Commonly known as: AMARYL  Take 1 tablet (2 mg total) by mouth in the morning and at bedtime.   icosapent  Ethyl 1 g capsule Commonly known as: Vascepa  Take 2 capsules (2 g total) by mouth 2 (two) times daily.   ICY HOT ADVANCED PAIN RELIEF EX Apply 1 application   topically daily as needed (to painful sites).   Klor-Con  M20 20 MEQ tablet Generic drug: potassium chloride  SA TAKE 2 TABLETS BY MOUTH DAILY   metoprolol  succinate 25 MG 24 hr tablet Commonly known as: TOPROL -XL TAKE 1 TABLET (25 MG TOTAL) BY MOUTH DAILY.   nitroGLYCERIN  0.4 MG SL tablet Commonly known as: NITROSTAT  PLACE 1 TABLET UNDER THE TONGUE EVERY 5 MINUTES AS NEEDED FOR CHEST PAIN.   pregabalin  100 MG capsule Commonly known as: Lyrica  Take 1 capsule (100 mg total) by mouth daily. At noon   pregabalin  150 MG capsule Commonly known as: LYRICA  Take 1 capsule (150 mg total) by mouth at bedtime. (Take 100mg  daily at noon)   psyllium 58.6 % powder Commonly known as: METAMUCIL Take 1 packet by mouth daily as needed (for constipation- mix and drink).   torsemide  20 MG tablet Commonly known as: DEMADEX  TAKE 2 TAB BY MOUTH IN MORNING TAKE EXTRA TABLET FOR WEIGHT GAIN MORE THAN 2-3LBS IN 24HR FOR 90DAY   Trulicity  3 MG/0.5ML Soaj Generic drug: Dulaglutide  Inject 3 mg into the skin once a week.   VITAMIN C  PO Take 1 tablet by mouth daily with breakfast.        Allergies:  Allergies  Allergen Reactions   Ace Inhibitors Swelling and Other (See Comments)    Angioedema    Benazepril Swelling and Other (See Comments)    Angioedema; he is not a candidate for any angiotensin receptor blockers because of this significant allergic reaction. Because of a history of documented adverse serious drug reaction;Medi Alert bracelet  is recommended   Entresto  [Sacubitril -Valsartan ] Swelling and Other (See Comments)    First-in-Class Angiotensin Receptor Neprilysin Inhibitor- Med was red-flagged by the patient's pharmacy for him to NOT take!   Hctz [Hydrochlorothiazide] Anaphylaxis and Swelling    Tongue and lip swelling    Aspirin  Other (See Comments)    Gastritis, can aspirin  not take 325 mg aspirin     Past Medical History, Surgical history, Social history, and Family History  were reviewed and updated.  Review of Systems: All other 10 point review of systems is negative.   Physical Exam:  vitals were not taken for this visit.   Wt Readings from Last 3 Encounters:  07/11/23 235 lb 0.6 oz (106.6 kg)  06/09/23 233 lb 4 oz (105.8 kg)  06/09/23 232 lb 1.9 oz (105.3 kg)    Ocular: Sclerae unicteric, pupils equal, round and reactive to light Ear-nose-throat: Oropharynx clear, dentition fair Lymphatic: No cervical or supraclavicular adenopathy Lungs no rales or rhonchi, good excursion bilaterally Heart regular rate and rhythm, no murmur appreciated Abd soft, nontender, positive bowel sounds MSK no focal spinal tenderness, no joint edema Neuro: non-focal, well-oriented, appropriate affect Breasts: Deferred   Lab Results  Component Value Date   WBC 7.8 08/08/2023  HGB 14.8 08/08/2023   HCT 45.7 08/08/2023   MCV 90.9 08/08/2023   PLT 204 08/08/2023   Lab Results  Component Value Date   FERRITIN 1,477 (H) 06/09/2023   IRON 91 06/09/2023   TIBC 358 06/09/2023   UIBC 267 06/09/2023   IRONPCTSAT 25 06/09/2023   Lab Results  Component Value Date   RETICCTPCT 1.7 04/07/2023   RBC 5.03 08/08/2023   RETICCTABS 52.1 11/22/2011   No results found for: JONATHAN BONG [facility] Lab Results  Component Value Date   IGA 159 03/09/2012   Lab Results  Component Value Date   ALBUMINELP 4.2 07/27/2018   MSPIKE Not Observed 07/27/2018     Chemistry      Component Value Date/Time   NA 142 07/11/2023 0919   NA 138 02/08/2022 0811   NA 144 06/27/2017 0857   K 4.5 07/11/2023 0919   K 3.9 06/27/2017 0857   CL 101 07/11/2023 0919   CL 103 06/27/2017 0857   CO2 32 07/11/2023 0919   CO2 26 06/27/2017 0857   BUN 37 (H) 07/11/2023 0919   BUN 46 (H) 02/08/2022 0811   BUN 12 06/27/2017 0857   CREATININE 1.96 (H) 07/11/2023 0919   CREATININE 1.65 (H) 04/18/2020 0956      Component Value Date/Time   CALCIUM  10.1 07/11/2023 0919   CALCIUM   9.2 06/27/2017 0857   ALKPHOS 65 07/11/2023 0919   ALKPHOS 128 (H) 06/27/2017 0857   AST 24 07/11/2023 0919   ALT 23 07/11/2023 0919   ALT 24 06/27/2017 0857   BILITOT 0.5 07/11/2023 0919       Impression and Plan: Mr. Brickley is a very pleasant 83 yo caucasian gentleman with diffuse large B-cell lymphoma (not double hit lymphoma). He completed treatment in February 2019.  He is now on Xgeva  every 3 months along with PRN IV iron and IM B12.  We will proceed with B 12 injection and Xgeva  today as planned.  Labs routed to his nephrologist Dr. Dennise today as requested.  Follow-up in 1 month.   Lauraine Pepper, NP 2/7/202510:15 AM

## 2023-08-08 NOTE — Patient Instructions (Signed)
 Vitamin B12 Injection What is this medication? Vitamin B12 (VAHY tuh min B12) prevents and treats low vitamin B12 levels in your body. It is used in people who do not get enough vitamin B12 from their diet or when their digestive tract does not absorb enough. Vitamin B12 plays an important role in maintaining the health of your nervous system and red blood cells. This medicine may be used for other purposes; ask your health care provider or pharmacist if you have questions. COMMON BRAND NAME(S): B-12 Compliance Kit, B-12 Injection Kit, Cyomin, Dodex, LA-12, Nutri-Twelve, Physicians EZ Use B-12, Primabalt, Vitamin Deficiency Injectable System - B12 What should I tell my care team before I take this medication? They need to know if you have any of these conditions: Kidney disease Leber's disease Megaloblastic anemia An unusual or allergic reaction to cyanocobalamin, cobalt, other medications, foods, dyes, or preservatives Pregnant or trying to get pregnant Breast-feeding How should I use this medication? This medication is injected into a muscle or deeply under the skin. It is usually given in a clinic or care team's office. However, your care team may teach you how to inject yourself. Follow all instructions. Talk to your care team about the use of this medication in children. Special care may be needed. Overdosage: If you think you have taken too much of this medicine contact a poison control center or emergency room at once. NOTE: This medicine is only for you. Do not share this medicine with others. What if I miss a dose? If you are given your dose at a clinic or care team's office, call to reschedule your appointment. If you give your own injections, and you miss a dose, take it as soon as you can. If it is almost time for your next dose, take only that dose. Do not take double or extra doses. What may interact with this medication? Alcohol Colchicine This list may not describe all possible  interactions. Give your health care provider a list of all the medicines, herbs, non-prescription drugs, or dietary supplements you use. Also tell them if you smoke, drink alcohol, or use illegal drugs. Some items may interact with your medicine. What should I watch for while using this medication? Visit your care team regularly. You may need blood work done while you are taking this medication. You may need to follow a special diet. Talk to your care team. Limit your alcohol intake and avoid smoking to get the best benefit. What side effects may I notice from receiving this medication? Side effects that you should report to your care team as soon as possible: Allergic reactions--skin rash, itching, hives, swelling of the face, lips, tongue, or throat Swelling of the ankles, hands, or feet Trouble breathing Side effects that usually do not require medical attention (report to your care team if they continue or are bothersome): Diarrhea This list may not describe all possible side effects. Call your doctor for medical advice about side effects. You may report side effects to FDA at 1-800-FDA-1088. Where should I keep my medication? Keep out of the reach of children. Store at room temperature between 15 and 30 degrees C (59 and 85 degrees F). Protect from light. Throw away any unused medication after the expiration date. NOTE: This sheet is a summary. It may not cover all possible information. If you have questions about this medicine, talk to your doctor, pharmacist, or health care provider.  2024 Elsevier/Gold Standard (2021-02-27 00:00:00) Denosumab Injection (Oncology) What is this medication? DENOSUMAB (  den oh SUE mab) prevents weakened bones caused by cancer. It may also be used to treat noncancerous bone tumors that cannot be removed by surgery. It can also be used to treat high calcium levels in the blood caused by cancer. It works by blocking a protein that causes bones to break down  quickly. This slows down the release of calcium from bones, which lowers calcium levels in your blood. It also makes your bones stronger and less likely to break (fracture). This medicine may be used for other purposes; ask your health care provider or pharmacist if you have questions. COMMON BRAND NAME(S): XGEVA What should I tell my care team before I take this medication? They need to know if you have any of these conditions: Dental disease Having surgery or tooth extraction Infection Kidney disease Low levels of calcium or vitamin D in the blood Malnutrition On hemodialysis Skin conditions or sensitivity Thyroid or parathyroid disease An unusual reaction to denosumab, other medications, foods, dyes, or preservatives Pregnant or trying to get pregnant Breast-feeding How should I use this medication? This medication is for injection under the skin. It is given by your care team in a hospital or clinic setting. A special MedGuide will be given to you before each treatment. Be sure to read this information carefully each time. Talk to your care team about the use of this medication in children. While it may be prescribed for children as young as 13 years for selected conditions, precautions do apply. Overdosage: If you think you have taken too much of this medicine contact a poison control center or emergency room at once. NOTE: This medicine is only for you. Do not share this medicine with others. What if I miss a dose? Keep appointments for follow-up doses. It is important not to miss your dose. Call your care team if you are unable to keep an appointment. What may interact with this medication? Do not take this medication with any of the following: Other medications containing denosumab This medication may also interact with the following: Medications that lower your chance of fighting infection Steroid medications, such as prednisone or cortisone This list may not describe all  possible interactions. Give your health care provider a list of all the medicines, herbs, non-prescription drugs, or dietary supplements you use. Also tell them if you smoke, drink alcohol, or use illegal drugs. Some items may interact with your medicine. What should I watch for while using this medication? Your condition will be monitored carefully while you are receiving this medication. You may need blood work while taking this medication. This medication may increase your risk of getting an infection. Call your care team for advice if you get a fever, chills, sore throat, or other symptoms of a cold or flu. Do not treat yourself. Try to avoid being around people who are sick. You should make sure you get enough calcium and vitamin D while you are taking this medication, unless your care team tells you not to. Discuss the foods you eat and the vitamins you take with your care team. Some people who take this medication have severe bone, joint, or muscle pain. This medication may also increase your risk for jaw problems or a broken thigh bone. Tell your care team right away if you have severe pain in your jaw, bones, joints, or muscles. Tell your care team if you have any pain that does not go away or that gets worse. Talk to your care team if you may  be pregnant. Serious birth defects can occur if you take this medication during pregnancy and for 5 months after the last dose. You will need a negative pregnancy test before starting this medication. Contraception is recommended while taking this medication and for 5 months after the last dose. Your care team can help you find the option that works for you. What side effects may I notice from receiving this medication? Side effects that you should report to your care team as soon as possible: Allergic reactions--skin rash, itching, hives, swelling of the face, lips, tongue, or throat Bone, joint, or muscle pain Low calcium level--muscle pain or cramps,  confusion, tingling, or numbness in the hands or feet Osteonecrosis of the jaw--pain, swelling, or redness in the mouth, numbness of the jaw, poor healing after dental work, unusual discharge from the mouth, visible bones in the mouth Side effects that usually do not require medical attention (report to your care team if they continue or are bothersome): Cough Diarrhea Fatigue Headache Nausea This list may not describe all possible side effects. Call your doctor for medical advice about side effects. You may report side effects to FDA at 1-800-FDA-1088. Where should I keep my medication? This medication is given in a hospital or clinic. It will not be stored at home. NOTE: This sheet is a summary. It may not cover all possible information. If you have questions about this medicine, talk to your doctor, pharmacist, or health care provider.  2024 Elsevier/Gold Standard (2021-11-07 00:00:00)

## 2023-08-19 ENCOUNTER — Other Ambulatory Visit: Payer: Self-pay | Admitting: Family Medicine

## 2023-08-19 DIAGNOSIS — E119 Type 2 diabetes mellitus without complications: Secondary | ICD-10-CM | POA: Diagnosis not present

## 2023-08-19 DIAGNOSIS — H02132 Senile ectropion of right lower eyelid: Secondary | ICD-10-CM | POA: Diagnosis not present

## 2023-08-19 DIAGNOSIS — H0100A Unspecified blepharitis right eye, upper and lower eyelids: Secondary | ICD-10-CM | POA: Diagnosis not present

## 2023-08-19 DIAGNOSIS — Z961 Presence of intraocular lens: Secondary | ICD-10-CM | POA: Diagnosis not present

## 2023-08-19 LAB — HM DIABETES EYE EXAM

## 2023-08-19 NOTE — Telephone Encounter (Signed)
Requesting: Lyrica  Contract: None recently seen  UDS: None recently seen Last Visit: 06/09/23 Next Visit: None Last Refill: see med list   Please Advise

## 2023-08-26 ENCOUNTER — Ambulatory Visit: Payer: Medicare Other | Admitting: Dermatology

## 2023-08-27 ENCOUNTER — Other Ambulatory Visit: Payer: Self-pay | Admitting: Cardiology

## 2023-08-27 ENCOUNTER — Other Ambulatory Visit: Payer: Self-pay | Admitting: Family Medicine

## 2023-08-27 ENCOUNTER — Ambulatory Visit: Payer: Medicare Other | Admitting: Dermatology

## 2023-08-27 DIAGNOSIS — W908XXA Exposure to other nonionizing radiation, initial encounter: Secondary | ICD-10-CM

## 2023-08-27 DIAGNOSIS — L570 Actinic keratosis: Secondary | ICD-10-CM | POA: Diagnosis not present

## 2023-08-27 DIAGNOSIS — D492 Neoplasm of unspecified behavior of bone, soft tissue, and skin: Secondary | ICD-10-CM

## 2023-08-27 DIAGNOSIS — C44629 Squamous cell carcinoma of skin of left upper limb, including shoulder: Secondary | ICD-10-CM | POA: Diagnosis not present

## 2023-08-27 DIAGNOSIS — N183 Type 2 diabetes mellitus with diabetic chronic kidney disease: Secondary | ICD-10-CM

## 2023-08-27 DIAGNOSIS — Z872 Personal history of diseases of the skin and subcutaneous tissue: Secondary | ICD-10-CM | POA: Diagnosis not present

## 2023-08-27 DIAGNOSIS — L578 Other skin changes due to chronic exposure to nonionizing radiation: Secondary | ICD-10-CM

## 2023-08-27 DIAGNOSIS — D485 Neoplasm of uncertain behavior of skin: Secondary | ICD-10-CM

## 2023-08-27 DIAGNOSIS — Z7189 Other specified counseling: Secondary | ICD-10-CM | POA: Diagnosis not present

## 2023-08-27 NOTE — Progress Notes (Signed)
 Follow-Up Visit   Subjective  Richard [name] is a 83 y.o. male who presents for the following: Actinic keratosis.  The patient has spots, moles and lesions to be evaluated, some may be new or changing. He has a growth on his left hand, tender, growing. History of Aks, SCCs.  The following portions of the chart were reviewed this encounter and updated as appropriate: medications, allergies, medical history  Review of Systems:  No other skin or systemic complaints except as noted in HPI or Assessment and Plan.  Objective  Well appearing patient in no apparent distress; mood and affect are within normal limits.  A focused examination was performed of the following areas: Face, arms  Relevant exam findings are noted in the Assessment and Plan.  Left thenar hand dorsum 1.4 cm tender pink keratotic nodule  R forearm x 3, R hand dorsum x 1, L forearm x 13, L hand dorsum x 2 (19) Pink scaly papules.  Assessment & Plan   NEOPLASM OF UNCERTAIN BEHAVIOR OF SKIN Left thenar hand dorsum Epidermal / dermal shaving  Lesion diameter (cm):  1.4 Informed consent: discussed and consent obtained   Patient was prepped and draped in usual sterile fashion: Area prepped with alcohol. Anesthesia: the lesion was anesthetized in a standard fashion   Anesthetic:  1% lidocaine w/ epinephrine 1-100,000 buffered w/ 8.4% NaHCO3 Instrument used: flexible razor blade   Hemostasis achieved with: pressure, aluminum chloride and electrodesiccation   Outcome: patient tolerated procedure well    Destruction of lesion  Destruction method: electrodesiccation and curettage   Informed consent: discussed and consent obtained   Curettage performed in three different directions: Yes   Electrodesiccation performed over the curetted area: Yes   Final wound size (cm):  1.4 Hemostasis achieved with:  pressure, aluminum chloride and electrodesiccation Outcome: patient tolerated procedure well with no complications    Post-procedure details: wound care instructions given   Post-procedure details comment:  Ointment and bandage applied. Specimen 1 - Surgical pathology Differential Diagnosis: r/o SCC Check Margins: No EDC today Shave removal, EDC performed today.  HYPERTROPHIC ACTINIC KERATOSIS (19) R forearm x 3, R hand dorsum x 1, L forearm x 13, L hand dorsum x 2 (19) Actinic keratoses are precancerous spots that appear secondary to cumulative UV radiation exposure/sun exposure over time. They are chronic with expected duration over 1 year. A portion of actinic keratoses will progress to squamous cell carcinoma of the skin. It is not possible to reliably predict which spots will progress to skin cancer and so treatment is recommended to prevent development of skin cancer.  Recommend daily broad spectrum sunscreen SPF 30+ to sun-exposed areas, reapply every 2 hours as needed.  Recommend staying in the shade or wearing long sleeves, sun glasses (UVA+UVB protection) and wide brim hats (4-inch brim around the entire circumference of the hat). Call for new or changing lesions. Destruction of lesion - R forearm x 3, R hand dorsum x 1, L forearm x 13, L hand dorsum x 2 (19)  Destruction method: cryotherapy   Informed consent: discussed and consent obtained   Lesion destroyed using liquid nitrogen: Yes   Region frozen until ice ball extended beyond lesion: Yes   Outcome: patient tolerated procedure well with no complications   Post-procedure details: wound care instructions given   Additional details:  Prior to procedure, discussed risks of blister formation, small wound, skin dyspigmentation, or rare scar following cryotherapy. Recommend Vaseline ointment to treated areas while healing.  ACTINIC DAMAGE WITH PRECANCEROUS ACTINIC KERATOSES Counseling for Topical Chemotherapy Management: Patient exhibits: - Severe, confluent actinic changes with pre-cancerous actinic keratoses that is secondary to  cumulative UV radiation exposure over time - Condition that is severe; chronic, not at goal. - diffuse scaly erythematous macules and papules with underlying dyspigmentation - Discussed Prescription "Field Treatment" topical Chemotherapy for Severe, Chronic Confluent Actinic Changes with Pre-Cancerous Actinic Keratoses Field treatment involves treatment of an entire area of skin that has confluent Actinic Changes (Sun/ Ultraviolet light damage) and PreCancerous Actinic Keratoses by method of PhotoDynamic Therapy (PDT) and/or prescription Topical Chemotherapy agents such as 5-fluorouracil, 5-fluorouracil/calcipotriene, and/or imiquimod.  The purpose is to decrease the number of clinically evident and subclinical PreCancerous lesions to prevent progression to development of skin cancer by chemically destroying early precancer changes that may or may not be visible.  It has been shown to reduce the risk of developing skin cancer in the treated area. As a result of treatment, redness, scaling, crusting, and open sores may occur during treatment course. One or more than one of these methods may be used and may have to be used several times to control, suppress and eliminate the PreCancerous changes. Discussed treatment course, expected reaction, and possible side effects. - Recommend daily broad spectrum sunscreen SPF 30+ to sun-exposed areas, reapply every 2 hours as needed.  - Staying in the shade or wearing long sleeves, sun glasses (UVA+UVB protection) and wide brim hats (4-inch brim around the entire circumference of the hat) are also recommended. - Call for new or changing lesions. - Patient will schedule 2nd Red light PDTwith debridement to face..   Return 3-4 mo f/u, for AKs, f/u bx L hand. Next available Red light with debridement to face. Wendee Beavers, CMA, am acting as scribe for Willeen Niece, MD .   Documentation: I have reviewed the above documentation for accuracy and completeness, and  I agree with the above.  Willeen Niece, MD

## 2023-08-27 NOTE — Patient Instructions (Addendum)
 Wound Care Instructions  Cleanse wound gently with soap and water once a day then pat dry with clean gauze. Apply a thin coat of Petrolatum (petroleum jelly, "Vaseline") over the wound (unless you have an allergy to this). We recommend that you use a new, sterile tube of Vaseline. Do not pick or remove scabs. Do not remove the yellow or white "healing tissue" from the base of the wound.  Cover the wound with fresh, clean, nonstick gauze and secure with paper tape. You may use Band-Aids in place of gauze and tape if the wound is small enough, but would recommend trimming much of the tape off as there is often too much. Sometimes Band-Aids can irritate the skin.  You should call the office for your biopsy report after 1 week if you have not already been contacted.  If you experience any problems, such as abnormal amounts of bleeding, swelling, significant bruising, significant pain, or evidence of infection, please call the office immediately.  FOR ADULT SURGERY PATIENTS: If you need something for pain relief you may take 1 extra strength Tylenol (acetaminophen) AND 2 Ibuprofen (200mg  each) together every 4 hours as needed for pain. (do not take these if you are allergic to them or if you have a reason you should not take them.) Typically, you may only need pain medication for 1 to 3 days.   Levulan/PDT Treatment Common Side Effects  - Burning/stinging, which may be severe and last up to 24-72 hours after your treatment  - Redness, swelling and/or peeling which may last up to 4 weeks  - Scaling/crusting which may last up to 2 weeks  - Sun sensitivity (you MUST avoid sun exposure for 48-72 hours after treatment)  Care Instructions  - Okay to wash with soap and water and shampoo as normal  - If needed, you can do a cold compress (ex. Ice packs) for comfort  - If okay with your Primary Doctor, you may use analgesics such as Tylenol every 4-6 hours, not to exceed recommended dose  - You may  apply Cerave Healing Ointment, Vaseline or Aquaphor  - If you have a lot of swelling you may take a Benadryl to help with this (this may cause drowsiness)  Sun Precautions  - Wear a wide brim hat for the next week if outside  - Wear a sunblock with zinc or titanium dioxide at least SPF 50 daily   We will recheck you in 10-12 weeks. If any problems, please call the office and ask to speak with a nurse.  Due to recent changes in healthcare laws, you may see results of your pathology and/or laboratory studies on MyChart before the doctors have had a chance to review them. We understand that in some cases there may be results that are confusing or concerning to you. Please understand that not all results are received at the same time and often the doctors may need to interpret multiple results in order to provide you with the best plan of care or course of treatment. Therefore, we ask that you please give [place] 2 business days to thoroughly review all your results before contacting the office for clarification. Should we see a critical lab result, you will be contacted sooner.   If You Need Anything After Your Visit  If you have any questions or concerns for your doctor, please call our main line at [phone] and press option 4 to reach your doctor's medical assistant. If no one answers, please leave a voicemail  as directed and we will return your call as soon as possible. Messages left after 4 pm will be answered the following business day.   You may also send [place] a message via MyChart. We typically respond to MyChart messages within 1-2 business days.  For prescription refills, please ask your pharmacy to contact our office. Our fax number is [phone].  If you have an urgent issue when the clinic is closed that cannot wait until the next business day, you can page your doctor at the number below.    Please note that while we do our best to be available for urgent issues outside of office  hours, we are not available 24/7.   If you have an urgent issue and are unable to reach [place], you may choose to seek medical care at your doctor's office, retail clinic, urgent care center, or emergency room.  If you have a medical emergency, please immediately call 911 or go to the emergency department.  Pager Numbers  - Dr. Gwen Pounds: [phone]  - Dr. Roseanne Reno: [phone]  - Dr. Katrinka Blazing: [phone]   In the event of inclement weather, please call our main line at [phone] for an update on the status of any delays or closures.  Dermatology Medication Tips: Please keep the boxes that topical medications come in in order to help keep track of the instructions about where and how to use these. Pharmacies typically print the medication instructions only on the boxes and not directly on the medication tubes.   If your medication is too expensive, please contact our office at [phone] option 4 or send [place] a message through MyChart.   We are unable to tell what your co-pay for medications will be in advance as this is different depending on your insurance coverage. However, we may be able to find a substitute medication at lower cost or fill out paperwork to get insurance to cover a needed medication.   If a prior authorization is required to get your medication covered by your insurance company, please allow [place] 1-2 business days to complete this process.  Drug prices often vary depending on where the prescription is filled and some pharmacies may offer cheaper prices.  The website www.goodrx.com contains coupons for medications through different pharmacies. The prices here do not account for what the cost may be with help from insurance (it may be cheaper with your insurance), but the website can give you the price if you did not use any insurance.  - You can print the associated coupon and take it with your prescription to the pharmacy.  - You may also stop by our office during  regular business hours and pick up a GoodRx coupon card.  - If you need your prescription sent electronically to a different pharmacy, notify our office through [facility] or by phone at [phone] option 4.     Si Usted Necesita Algo Despus de Su Visita  Tambin puede enviarnos un mensaje a travs de Clinical cytogeneticist. Por lo general respondemos a los mensajes de MyChart en el transcurso de 1 a 2 das hbiles.  Para renovar recetas, por favor pida a su farmacia que se ponga en contacto con nuestra oficina. Annie Sable de fax es [place] [phone].  Si tiene un asunto urgente cuando la clnica est cerrada y que no puede esperar hasta el siguiente da hbil, puede llamar/localizar a su doctor(a) al nmero que aparece a continuacin.   Por favor, tenga en cuenta que aunque hacemos todo lo  posible para estar disponibles para asuntos urgentes fuera del horario de oficina, no estamos disponibles las 24 horas del da, los 7 [address]  [address] de la [place].   Si tiene un problema urgente y no puede comunicarse con nosotros, puede optar por buscar atencin mdica  en el consultorio de su doctor(a), en una clnica privada, en un centro de atencin urgente o en una sala de emergencias.  Si tiene Engineer, drilling, por favor llame inmediatamente al 911 o vaya a la sala de emergencias.  Nmeros de bper  - Dr. Gwen Pounds: [phone]  - Dra. Roseanne Reno: [phone]  - Dr. Katrinka Blazing: [phone]   En caso de inclemencias del tiempo, por favor llame a Lacy Duverney principal al [phone] para una actualizacin sobre el [place] de cualquier retraso o cierre.  Consejos para la medicacin en dermatologa: Por favor, guarde las cajas en las que vienen los medicamentos de uso tpico para ayudarle a seguir las instrucciones sobre dnde y cmo usarlos. Las farmacias generalmente imprimen las instrucciones del medicamento slo en las cajas y no directamente en los tubos del [place].   Si su medicamento es muy  caro, por favor, pngase en contacto con Rolm Gala llamando al [phone] y presione la opcin 4 o envenos un mensaje a travs de Clinical cytogeneticist.   No podemos decirle cul ser su copago por los medicamentos por adelantado ya que esto es diferente dependiendo de la cobertura de su seguro. Sin embargo, es posible que podamos encontrar un medicamento sustituto a Audiological scientist un formulario para que el seguro cubra el medicamento que se considera necesario.   Si se requiere una autorizacin previa para que su compaa de seguros [place] su medicamento, por favor permtanos de 1 a 2 das hbiles para completar [address]  Los precios de los medicamentos varan con frecuencia dependiendo del Environmental consultant de dnde se surte la receta y alguna farmacias pueden ofrecer precios ms baratos.  El sitio web www.goodrx.com tiene cupones para medicamentos de Health and safety inspector. Los precios aqu no tienen en cuenta lo que podra costar con la ayuda del seguro (puede ser ms barato con su seguro), pero el sitio web puede darle el precio si no utiliz Tourist information centre manager.  - Puede imprimir el cupn correspondiente y llevarlo con su receta a la farmacia.  - Tambin puede pasar por nuestra oficina durante el horario de atencin regular y Education officer, museum una tarjeta de cupones de GoodRx.  - Si necesita que su receta se enve electrnicamente a una farmacia diferente, informe a nuestra oficina a travs de MyChart de [place] o por telfono llamando al [phone] y presione la opcin 4.

## 2023-08-28 ENCOUNTER — Other Ambulatory Visit: Payer: Self-pay | Admitting: Family Medicine

## 2023-08-28 DIAGNOSIS — Z23 Encounter for immunization: Secondary | ICD-10-CM | POA: Diagnosis not present

## 2023-08-28 LAB — SURGICAL PATHOLOGY

## 2023-09-01 ENCOUNTER — Telehealth: Payer: Self-pay

## 2023-09-01 NOTE — Telephone Encounter (Signed)
 Left pt msg to call for bx results/sh

## 2023-09-01 NOTE — Telephone Encounter (Signed)
-----   Message from Willeen Niece sent at 09/01/2023 12:44 PM EST ----- 1. Skin (M), left thenar hand dorsum :       WELL DIFFERENTIATED SQUAMOUS CELL CARCINOMA   SCC skin cancer- already treated with EDC at time of biopsy  - please call patient

## 2023-09-02 ENCOUNTER — Inpatient Hospital Stay: Payer: Medicare Other | Attending: Hematology & Oncology | Admitting: Family

## 2023-09-02 ENCOUNTER — Inpatient Hospital Stay: Payer: Medicare Other

## 2023-09-02 ENCOUNTER — Ambulatory Visit: Payer: Medicare Other

## 2023-09-02 ENCOUNTER — Encounter: Payer: Self-pay | Admitting: Family

## 2023-09-02 ENCOUNTER — Other Ambulatory Visit: Payer: Medicare Other

## 2023-09-02 VITALS — BP 121/70 | HR 79 | Temp 97.9°F | Resp 19 | Ht 72.0 in | Wt 236.0 lb

## 2023-09-02 DIAGNOSIS — D513 Other dietary vitamin B12 deficiency anemia: Secondary | ICD-10-CM

## 2023-09-02 DIAGNOSIS — E611 Iron deficiency: Secondary | ICD-10-CM | POA: Diagnosis not present

## 2023-09-02 DIAGNOSIS — C8332 Diffuse large B-cell lymphoma, intrathoracic lymph nodes: Secondary | ICD-10-CM | POA: Insufficient documentation

## 2023-09-02 DIAGNOSIS — D508 Other iron deficiency anemias: Secondary | ICD-10-CM

## 2023-09-02 DIAGNOSIS — Z79899 Other long term (current) drug therapy: Secondary | ICD-10-CM | POA: Insufficient documentation

## 2023-09-02 DIAGNOSIS — D519 Vitamin B12 deficiency anemia, unspecified: Secondary | ICD-10-CM

## 2023-09-02 DIAGNOSIS — D518 Other vitamin B12 deficiency anemias: Secondary | ICD-10-CM

## 2023-09-02 DIAGNOSIS — Z950 Presence of cardiac pacemaker: Secondary | ICD-10-CM | POA: Insufficient documentation

## 2023-09-02 DIAGNOSIS — D510 Vitamin B12 deficiency anemia due to intrinsic factor deficiency: Secondary | ICD-10-CM | POA: Insufficient documentation

## 2023-09-02 LAB — VITAMIN B12: Vitamin B-12: 260 pg/mL (ref 180–914)

## 2023-09-02 LAB — CBC WITH DIFFERENTIAL (CANCER CENTER ONLY)
Abs Immature Granulocytes: 0.06 10*3/uL (ref 0.00–0.07)
Basophils Absolute: 0 10*3/uL (ref 0.0–0.1)
Basophils Relative: 1 %
Eosinophils Absolute: 0.2 10*3/uL (ref 0.0–0.5)
Eosinophils Relative: 3 %
HCT: 44.5 % (ref 39.0–52.0)
Hemoglobin: 14.7 g/dL (ref 13.0–17.0)
Immature Granulocytes: 1 %
Lymphocytes Relative: 26 %
Lymphs Abs: 2.1 10*3/uL (ref 0.7–4.0)
MCH: 30.1 pg (ref 26.0–34.0)
MCHC: 33 g/dL (ref 30.0–36.0)
MCV: 91 fL (ref 80.0–100.0)
Monocytes Absolute: 0.6 10*3/uL (ref 0.1–1.0)
Monocytes Relative: 8 %
Neutro Abs: 4.9 10*3/uL (ref 1.7–7.7)
Neutrophils Relative %: 61 %
Platelet Count: 195 10*3/uL (ref 150–400)
RBC: 4.89 MIL/uL (ref 4.22–5.81)
RDW: 16.6 % — ABNORMAL HIGH (ref 11.5–15.5)
WBC Count: 8 10*3/uL (ref 4.0–10.5)
nRBC: 0 % (ref 0.0–0.2)

## 2023-09-02 LAB — CMP (CANCER CENTER ONLY)
ALT: 18 U/L (ref 0–44)
AST: 19 U/L (ref 15–41)
Albumin: 4.5 g/dL (ref 3.5–5.0)
Alkaline Phosphatase: 68 U/L (ref 38–126)
Anion gap: 10 (ref 5–15)
BUN: 30 mg/dL — ABNORMAL HIGH (ref 8–23)
CO2: 30 mmol/L (ref 22–32)
Calcium: 9.9 mg/dL (ref 8.9–10.3)
Chloride: 98 mmol/L (ref 98–111)
Creatinine: 1.76 mg/dL — ABNORMAL HIGH (ref 0.61–1.24)
GFR, Estimated: 37 mL/min — ABNORMAL LOW (ref >60)
Glucose, Bld: 234 mg/dL — ABNORMAL HIGH (ref 70–99)
Potassium: 4.4 mmol/L (ref 3.5–5.1)
Sodium: 138 mmol/L (ref 135–145)
Total Bilirubin: 0.6 mg/dL (ref 0.0–1.2)
Total Protein: 7.1 g/dL (ref 6.5–8.1)

## 2023-09-02 LAB — IRON AND IRON BINDING CAPACITY (CC-WL,HP ONLY)
Iron: 82 ug/dL (ref 45–182)
Saturation Ratios: 23 % (ref 17.9–39.5)
TIBC: 357 ug/dL (ref 250–450)
UIBC: 275 ug/dL (ref 117–376)

## 2023-09-02 LAB — FERRITIN: Ferritin: 1476 ng/mL — ABNORMAL HIGH (ref 24–336)

## 2023-09-02 MED ORDER — CYANOCOBALAMIN 1000 MCG/ML IJ SOLN
1000.0000 ug | Freq: Once | INTRAMUSCULAR | Status: AC
Start: 2023-09-02 — End: 2023-09-02
  Administered 2023-09-02: 1000 ug via INTRAMUSCULAR
  Filled 2023-09-02: qty 1

## 2023-09-02 NOTE — Progress Notes (Signed)
 Hematology and Oncology Follow Up Visit  Richard [name] [id number] 1941-02-12 83 y.o. 09/02/2023   Principle Diagnosis:  Diffuse large cell non-Hodgkin's lymphoma (IPI = 3) - NOT "double hit" Pernicious anemia Iron deficiency secondary to bleeding   Past Therapy: R-CHOP - s/p cycle 8 - completed 08/2017   Current Therapy:        Vitamin B12 1 mg IM every month Xgeva 120 mg subcu q 3 months - next dose due in 08/2023 IV iron as indicated               Interim History:  Richard [name] is here today for follow-up. He is doing well. He states that he got his Covid booster last week and had some body aches and fatigue after as wel as 1 episode of diarrhea. These symptoms have resolved and he is back out on the golf course enjoying himself.  No fever, chills, n/v, cough, rash, dizziness, SOB, chest pain, palpitations, abdominal pain or changes in bowel or bladder habits.  No swelling, tenderness, numbness or tingling in his extremities.  No falls or syncope reported.  Appetite and hydration are good. Weight is stable at 236 lbs.   ECOG Performance Status: 1 - Symptomatic but completely ambulatory  Medications:  Allergies as of 09/02/2023       Reactions   Ace Inhibitors Swelling, Other (See Comments)   Angioedema   Benazepril Swelling, Other (See Comments)   Angioedema; he is not a candidate for any angiotensin receptor blockers because of this significant allergic reaction. Because of a history of documented adverse serious drug reaction;Medi Alert bracelet  is recommended   Entresto [sacubitril-valsartan] Swelling, Other (See Comments)   First-in-Class Angiotensin Receptor Neprilysin Inhibitor- Med was "red-flagged" by the patient's pharmacy for him to NOT take!   Hctz [hydrochlorothiazide] Anaphylaxis, Swelling   Tongue and lip swelling   Aspirin Other (See Comments)   Gastritis, can aspirin not take 325 mg aspirin        Medication List        Accurate as of September 02, 2023  10:14 AM. If you have any questions, ask your nurse or doctor.          acetaminophen 325 MG tablet Commonly known as: TYLENOL Take 650 mg by mouth at bedtime as needed for moderate pain (pain score 4-6) or headache.   allopurinol 300 MG tablet Commonly known as: ZYLOPRIM Take 450 mg by mouth daily.   aluminum hydroxide-magnesium carbonate 95-358 MG/15ML Susp Commonly known as: GAVISCON Take 15 mLs by mouth as needed for indigestion or heartburn.   amoxicillin 500 MG capsule Commonly known as: AMOXIL Take 500 mg by mouth 3 (three) times daily.   atorvastatin 80 MG tablet Commonly known as: LIPITOR TAKE 1 TABLET BY MOUTH EVERYDAY AT BEDTIME   Azelastine HCl 137 MCG/SPRAY Soln PLACE 2 SPRAYS INTO BOTH NOSTRILS AT BEDTIME AS NEEDED FOR RHINITIS OR ALLERGIES.   CO Q-10 PO Take 100 mg by mouth daily.   Eliquis 2.5 MG Tabs tablet Generic drug: apixaban TAKE 1 TABLET BY MOUTH TWICE A DAY   esomeprazole 40 MG capsule Commonly known as: NexIUM Take 1 capsule (40 mg total) by mouth daily.   ezetimibe 10 MG tablet Commonly known as: ZETIA Take 1 tablet (10 mg total) by mouth daily.   Farxiga 10 MG Tabs tablet Generic drug: dapagliflozin propanediol Take 10 mg by mouth in the morning.   fenofibrate 160 MG tablet TAKE 1 TABLET BY MOUTH EVERY DAY  folic acid 1 MG tablet Commonly known as: FOLVITE TAKE 2 TABLETS BY MOUTH EVERY DAY   freestyle lancets USE ONCE A DAY TO CHECK BLOOD SUGAR.   FREESTYLE LITE test strip Generic drug: glucose blood USE TO TEST BLOOD SUGAR ONCE A DAY. DX CODE: E11.9   gabapentin 100 MG capsule Commonly known as: NEURONTIN Take 100 mg by mouth at bedtime. PRN   glimepiride 2 MG tablet Commonly known as: AMARYL TAKE 1 TABLET (2 MG TOTAL) BY MOUTH IN THE MORNING AND AT BEDTIME.   icosapent Ethyl 1 g capsule Commonly known as: Vascepa Take 2 capsules (2 g total) by mouth 2 (two) times daily.   ICY HOT ADVANCED PAIN RELIEF EX Apply 1  application  topically daily as needed (to painful sites).   Klor-Con M20 20 MEQ tablet Generic drug: potassium chloride SA TAKE 2 TABLETS BY MOUTH DAILY   metoprolol succinate 25 MG 24 hr tablet Commonly known as: TOPROL-XL TAKE 1 TABLET (25 MG TOTAL) BY MOUTH DAILY.   nitroGLYCERIN 0.4 MG SL tablet Commonly known as: NITROSTAT PLACE 1 TABLET UNDER THE TONGUE EVERY 5 MINUTES AS NEEDED FOR CHEST PAIN.   pregabalin 150 MG capsule Commonly known as: LYRICA TAKE 1 CAPSULE (150 MG TOTAL) BY MOUTH AT BEDTIME. (TAKE 100MG  DAILY AT NOON)   pregabalin 100 MG capsule Commonly known as: LYRICA TAKE 1 CAPSULE (100 MG TOTAL) BY MOUTH DAILY. AT NOON   psyllium 58.6 % powder Commonly known as: METAMUCIL Take 1 packet by mouth daily as needed (for constipation- mix and drink).   torsemide 20 MG tablet Commonly known as: DEMADEX TAKE 2 TAB BY MOUTH IN MORNING TAKE EXTRA TABLET FOR WEIGHT GAIN MORE THAN 2-3LBS IN 24HR FOR 90DAY   Trulicity 3 MG/0.5ML Soaj Generic drug: Dulaglutide Inject 3 mg into the skin once a week.   VITAMIN C PO Take 1 tablet by mouth daily with breakfast.        Allergies:  Allergies  Allergen Reactions   Ace Inhibitors Swelling and Other (See Comments)    Angioedema    Benazepril Swelling and Other (See Comments)    Angioedema; he is not a candidate for any angiotensin receptor blockers because of this significant allergic reaction. Because of a history of documented adverse serious drug reaction;Medi Alert bracelet  is recommended   Entresto [Sacubitril-Valsartan] Swelling and Other (See Comments)    First-in-Class Angiotensin Receptor Neprilysin Inhibitor- Med was "red-flagged" by the patient's pharmacy for him to NOT take!   Hctz [Hydrochlorothiazide] Anaphylaxis and Swelling    Tongue and lip swelling    Aspirin Other (See Comments)    Gastritis, can aspirin not take 325 mg aspirin    Past Medical History, Surgical history, Social history, and  Family History were reviewed and updated.  Review of Systems: All other 10 point review of systems is negative.   Physical Exam:  height is 6' (1.829 m) and weight is 236 lb (107 kg). His oral temperature is 97.9 F (36.6 C). His blood pressure is 121/70 and his pulse is 79. His respiration is 19 and oxygen saturation is 96%.   Wt Readings from Last 3 Encounters:  09/02/23 236 lb (107 kg)  08/08/23 236 lb 1.9 oz (107.1 kg)  07/11/23 235 lb 0.6 oz (106.6 kg)    Ocular: Sclerae unicteric, pupils equal, round and reactive to light Ear-nose-throat: Oropharynx clear, dentition fair Lymphatic: No cervical or supraclavicular adenopathy Lungs no rales or rhonchi, good excursion bilaterally Heart regular rate  and rhythm, no murmur appreciated Abd soft, nontender, positive bowel sounds MSK no focal spinal tenderness, no joint edema Neuro: non-focal, well-oriented, appropriate affect Breasts: Deferred   Lab Results  Component Value Date   WBC 8.0 09/02/2023   HGB 14.7 09/02/2023   HCT 44.5 09/02/2023   MCV 91.0 09/02/2023   PLT 195 09/02/2023   Lab Results  Component Value Date   FERRITIN 1,482 (H) 08/08/2023   IRON 82 08/08/2023   TIBC 396 08/08/2023   UIBC 314 08/08/2023   IRONPCTSAT 21 08/08/2023   Lab Results  Component Value Date   RETICCTPCT 1.7 04/07/2023   RBC 4.89 09/02/2023   RETICCTABS 52.1 11/22/2011   No results found for: "KPAFRELGTCHN", "LAMBDASER", "[facility]" Lab Results  Component Value Date   IGA 159 03/09/2012   Lab Results  Component Value Date   ALBUMINELP 4.2 07/27/2018   MSPIKE Not Observed 07/27/2018     Chemistry      Component Value Date/Time   NA 138 09/02/2023 0917   NA 138 02/08/2022 0811   NA 144 06/27/2017 0857   K 4.4 09/02/2023 0917   K 3.9 06/27/2017 0857   CL 98 09/02/2023 0917   CL 103 06/27/2017 0857   CO2 30 09/02/2023 0917   CO2 26 06/27/2017 0857   BUN 30 (H) 09/02/2023 0917   BUN 46 (H) 02/08/2022 0811   BUN 12  06/27/2017 0857   CREATININE 1.76 (H) 09/02/2023 0917   CREATININE 1.65 (H) 04/18/2020 0956      Component Value Date/Time   CALCIUM 9.9 09/02/2023 0917   CALCIUM 9.2 06/27/2017 0857   ALKPHOS 68 09/02/2023 0917   ALKPHOS 128 (H) 06/27/2017 0857   AST 19 09/02/2023 0917   ALT 18 09/02/2023 0917   ALT 24 06/27/2017 0857   BILITOT 0.6 09/02/2023 0917       Impression and Plan: Mr. Mcisaac is a very pleasant 83 yo caucasian gentleman with diffuse large B-cell lymphoma (not "double hit" lymphoma). He completed treatment in February 2019.  He is now on Xgeva every 3 months along with PRN IV iron and IM B12.  We will proceed with B 12 injection.  Iron studies pending.  CBC and CMP routed to Dr. Romelle Starcher with nephrology.  Follow-up in 1 month.   Eileen Stanford, NP 3/4/202510:14 AM

## 2023-09-02 NOTE — Telephone Encounter (Signed)
 Patient advised of BX results. aw

## 2023-09-02 NOTE — Patient Instructions (Signed)
 Vitamin B12 Deficiency Vitamin B12 deficiency occurs when the body does not have enough of this important vitamin. The body needs this vitamin: To make red blood cells. To make DNA. This is the genetic material inside cells. To help the nerves work properly so they can carry messages from the brain to the body. Vitamin B12 deficiency can cause health problems, such as not having enough red blood cells in the blood (anemia). This can lead to nerve damage if untreated. What are the causes? This condition may be caused by: Not eating enough foods that contain vitamin B12. Not having enough stomach acid and digestive fluids to properly absorb vitamin B12 from the food that you eat. Having certain diseases that make it hard to absorb vitamin B12. These diseases include Crohn's disease, chronic pancreatitis, and cystic fibrosis. An autoimmune disorder in which the body does not make enough of a protein (intrinsic factor) within the stomach, resulting in not enough absorption of vitamin B12. Having a surgery in which part of the stomach or small intestine is removed. Taking certain medicines that make it hard for the body to absorb vitamin B12. These include: Heartburn medicines, such as antacids and proton pump inhibitors. Some medicines that are used to treat diabetes. What increases the risk? The following factors may make you more likely to develop a vitamin B12 deficiency: Being an older adult. Eating a vegetarian or vegan diet that does not include any foods that come from animals. Eating a poor diet while you are pregnant. Taking certain medicines. Having alcoholism. What are the signs or symptoms? In some cases, there are no symptoms of this condition. If the condition leads to anemia or nerve damage, various symptoms may occur, such as: Weakness. Tiredness (fatigue). Loss of appetite. Numbness or tingling in your hands and feet. Redness and burning of the tongue. Depression,  confusion, or memory problems. Trouble walking. If anemia is severe, symptoms can include: Shortness of breath. Dizziness. Rapid heart rate. How is this diagnosed? This condition may be diagnosed with a blood test to measure the level of vitamin B12 in your blood. You may also have other tests, including: A group of tests that measure certain characteristics of blood cells (complete blood count, CBC). A blood test to measure intrinsic factor. A procedure where a thin tube with a camera on the end is used to look into your stomach or intestines (endoscopy). Other tests may be needed to discover the cause of the deficiency. How is this treated? Treatment for this condition depends on the cause. This condition may be treated by: Changing your eating and drinking habits, such as: Eating more foods that contain vitamin B12. Drinking less alcohol or no alcohol. Getting vitamin B12 injections. Taking vitamin B12 supplements by mouth (orally). Your health care provider will tell you which dose is best for you. Follow these instructions at home: Eating and drinking  Include foods in your diet that come from animals and contain a lot of vitamin B12. These include: Meats and poultry. This includes beef, pork, chicken, [place], and organ meats, such as liver. Seafood. This includes clams, rainbow trout, salmon, tuna, and haddock. Eggs. Dairy foods such as milk, yogurt, and cheese. Eat foods that have vitamin B12 added to them (are fortified), such as ready-to-eat breakfast cereals. Check the label on the package to see if a food is fortified. The items listed above may not be a complete list of foods and beverages you can eat and drink. Contact a dietitian for  more information. Alcohol use Do not drink alcohol if: Your health care provider tells you not to drink. You are pregnant, may be pregnant, or are planning to become pregnant. If you drink alcohol: Limit how much you have to: 0-1 drink a  day for women. 0-2 drinks a day for men. Know how much alcohol is in your drink. In the [place] one drink equals one 12 oz bottle of beer (355 mL), one 5 oz glass of wine (148 mL), or one 1 oz glass of hard liquor (44 mL). General instructions Get vitamin B12 injections if told to by your health care provider. Take supplements only as told by your health care provider. Follow the directions carefully. Keep all follow-up visits. This is important. Contact a health care provider if: Your symptoms come back. Your symptoms get worse or do not improve with treatment. Get help right away: You develop shortness of breath. You have a rapid heart rate. You have chest pain. You become dizzy or you faint. These symptoms may be an emergency. Get help right away. Call 911. Do not wait to see if the symptoms will go away. Do not drive yourself to the hospital. Summary Vitamin B12 deficiency occurs when the body does not have enough of this important vitamin. Common causes include not eating enough foods that contain vitamin B12, not being able to absorb vitamin B12 from the food that you eat, having a surgery in which part of the stomach or small intestine is removed, or taking certain medicines. Eat foods that have vitamin B12 in them. Treatment may include making a change in the way you eat and drink, getting vitamin B12 injections, or taking vitamin B12 supplements. This information is not intended to replace advice given to you by your health care provider. Make sure you discuss any questions you have with your health care provider. Document Revised: 02/09/2021 Document Reviewed: 02/09/2021 Elsevier Patient Education  2024 [employer].

## 2023-09-03 ENCOUNTER — Other Ambulatory Visit: Payer: Self-pay | Admitting: Cardiology

## 2023-09-08 NOTE — Progress Notes (Unsigned)
 Cardiology Office Note:   Date:  09/11/2023  ID:  Richard [name], DOB April 06, 1941, MRN [id number] PCP: Zola Button, Grayling Congress, DO  [facility] Providers Cardiologist:  Rollene Rotunda, MD Electrophysiologist:  Sherryl Manges, MD {  History of Present Illness:   Richard [name] is a 83 y.o. male who presents for follow up of CAD and TAVR.  He has had atrial flutter and heart block as well necessitating a pacemaker.   He has had continued problems with triglycerides.  He has some renal insufficiency.    He had a leadless pacemaker placed.   This was in response to subclavian stenosis associated with a transvenous pacemaker.   He still has chronic thrombosis .  He also was in the hospital  at [facility] in [place] with acute sepsis.  The etiology was not clear.  He did recover from this.  He presented with altered mental status and loss of consciousness.  He has SOB in 2023 and had a perfusion study with no prior ischemia or infarct.  He had an EF of 30 - 35%.    Since I last saw him he was back at [facility] after having had a Medtronic dual-chamber pacemaker with left area bundle branch pacing inserted.   I reviewed these records for this visit.   He subsequently had bacteremia thought to be related to this.  He was managed with a long course of antibiotics but they decided not to explant the device.  He went home on antibiotics and actually went to rehab for quite a while.  Unable to see some of these records.  He is now on oral antibiotics with amoxicillin.  He had Enterococcus.  Remarkably he is doing very well.  He denies any new cardiovascular symptoms and is back to golfing.  He denies chest pressure, neck or arm discomfort.  He has had no new palpitations, presyncope or syncope.  He has had no new PND or orthopnea.  He has had no weight gain or edema.  Of note he was in atrial fibrillation and required cardioversion.  He does not feel the fibrillation.  He tolerates his anticoagulation.  ROS:  As stated in the HPI and negative for all other systems.  Studies Reviewed:    EKG:   EKG Interpretation Date/Time:  Thursday September 11 2023 08:09:40 EDT Ventricular Rate:  78 PR Interval:  140 QRS Duration:  136 QT Interval:  452 QTC Calculation: 515 R Axis:   -67  Text Interpretation: Atrial-sensed ventricular-paced rhythm When compared with ECG of 14-Apr-2023 19:52, No significant change since last tracing Confirmed by Rollene Rotunda ([id number]) on 09/11/2023 8:14:33 AM    Risk Assessment/Calculations:    CHA2DS2-VASc Score = 6   This indicates a 9.7% annual risk of stroke. The patient's score is based upon: CHF History: 1 HTN History: 1 Diabetes History: 1 Stroke History: 0 Vascular Disease History: 1 Age Score: 2 Gender Score: 0        Physical Exam:   VS:  BP 102/62 (BP Location: Right Arm, Patient Position: Sitting, Cuff Size: Large)   Pulse 78   Ht 6' (1.829 m)   Wt 236 lb (107 kg)   SpO2 94%   BMI 32.01 kg/m    Wt Readings from Last 3 Encounters:  09/11/23 236 lb (107 kg)  09/02/23 236 lb (107 kg)  08/08/23 236 lb 1.9 oz (107.1 kg)     GEN: Well nourished, well developed in no acute distress NECK: No JVD;  No carotid bruits CARDIAC: RRR, no murmurs, rubs, gallops RESPIRATORY:  Clear to auscultation without rales, wheezing or rhonchi  ABDOMEN: Soft, non-tender, non-distended EXTREMITIES:  No edema; No deformity   ASSESSMENT AND PLAN:   Chronic systolic heart failure:    EF was 20 - 25% in October.  He seems to be euvolemic.  He has not tolerated Entresto or ACE inhibitor's in the past.  Going to avoid these.  He has had progressive renal insufficiency when we tried to titrate these.  His blood pressure is low so I do not think he will tolerate Imdur or hydralazine.  He will continue the meds as listed.  I will follow-up with an echo sometime later this year.  Hypertension:    This is being managed in the context of treating his CHF     Atrial fibrillation:  He tolerates anticoagulation and currently seems to be in sinus rhythm.  CAD s/p CABG 2001:   He had patent bypass at the time of his TAVR.  He is not having any anginal symptoms.  No change in therapy.    2:1 AVB: He now has the  dual chamber pacemaker.    He has follow up at [facility].    AS s/p TAVR:    He had normal function on 10/24 echo with mean gradient of 9.5.     OSA on CPAP: He uses his CPAP.   Follow up with me in 3 months.   Signed, Rollene Rotunda, MD

## 2023-09-10 ENCOUNTER — Other Ambulatory Visit: Payer: Self-pay | Admitting: Family Medicine

## 2023-09-10 DIAGNOSIS — E1151 Type 2 diabetes mellitus with diabetic peripheral angiopathy without gangrene: Secondary | ICD-10-CM

## 2023-09-11 ENCOUNTER — Encounter: Payer: Self-pay | Admitting: Cardiology

## 2023-09-11 ENCOUNTER — Ambulatory Visit: Payer: Medicare Other | Attending: Cardiology | Admitting: Cardiology

## 2023-09-11 VITALS — BP 102/62 | HR 78 | Ht 72.0 in | Wt 236.0 lb

## 2023-09-11 DIAGNOSIS — I10 Essential (primary) hypertension: Secondary | ICD-10-CM

## 2023-09-11 DIAGNOSIS — I5022 Chronic systolic (congestive) heart failure: Secondary | ICD-10-CM | POA: Diagnosis not present

## 2023-09-11 DIAGNOSIS — I25810 Atherosclerosis of coronary artery bypass graft(s) without angina pectoris: Secondary | ICD-10-CM

## 2023-09-11 DIAGNOSIS — Z952 Presence of prosthetic heart valve: Secondary | ICD-10-CM

## 2023-09-11 NOTE — Patient Instructions (Signed)
 Medication Instructions:  No changes. *If you need a refill on your cardiac medications before your next appointment, please call your pharmacy*   Follow-Up: At [facility], you and your health needs are our priority.  As part of our continuing mission to provide you with exceptional heart care, we have created designated Provider Care Teams.  These Care Teams include your primary Cardiologist (physician) and Advanced Practice Providers (APPs -  Physician Assistants and Nurse Practitioners) who all work together to provide you with the care you need, when you need it.  We recommend signing up for the patient portal called "MyChart".  Sign up information is provided on this After Visit Summary.  MyChart is used to connect with patients for Virtual Visits (Telemedicine).  Patients are able to view lab/test results, encounter notes, upcoming appointments, etc.  Non-urgent messages can be sent to your provider as well.   To learn more about what you can do with MyChart, go to [email or web address].    Your next appointment:   3 month(s)  Provider:   Rollene Rotunda, MD     Other Instructions

## 2023-09-14 ENCOUNTER — Other Ambulatory Visit: Payer: Self-pay | Admitting: Family Medicine

## 2023-09-21 ENCOUNTER — Other Ambulatory Visit: Payer: Self-pay | Admitting: Cardiology

## 2023-09-21 DIAGNOSIS — I483 Typical atrial flutter: Secondary | ICD-10-CM

## 2023-09-22 NOTE — Telephone Encounter (Signed)
 Prescription refill request for Eliquis received. Indication: TAVR Last office visit: 09/11/23  Daiva Nakayama MD Scr: 1.76 on 09/02/23  Epic Age: 83 Weight: 107kg  Based on above findings Eliquis 2.5mg  twice daily is the appropriate dose.  Refill approved.

## 2023-09-23 ENCOUNTER — Ambulatory Visit (INDEPENDENT_AMBULATORY_CARE_PROVIDER_SITE_OTHER): Payer: Medicare Other | Admitting: Dermatology

## 2023-09-23 DIAGNOSIS — L570 Actinic keratosis: Secondary | ICD-10-CM

## 2023-09-23 MED ORDER — AMINOLEVULINIC ACID HCL 10 % EX GEL
2000.0000 mg | Freq: Once | CUTANEOUS | Status: AC
  Administered 2023-09-23: 2000 mg via TOPICAL

## 2023-09-23 NOTE — Progress Notes (Signed)
 Patient completed red light phototherapy with debridement today to face.  ACTINIC KERATOSES Exam: Erythematous thin papules/macules with gritty scale.  Treatment Plan:  Red Light Photodynamic therapy  Procedure discussed: discussed risks, benefits, side effects. and alternatives   Prep: site scrubbed/prepped with acetone   Debridement needed: Yes (performed by Physician with sand paper.  (CPT [id number])) Location:  face Number of lesions:  Multiple (> 15) Type of treatment:  Red light Aminolevulinic Acid (see MAR for details): Ameluz Aminolevulinic Acid comment:  [id number] Amount of Ameluz (mg):  1 Incubation time (minutes):  60 Number of minutes under lamp:  20 Cooling:  Fan Outcome: patient tolerated procedure well with no complications   Post-procedure details: sunscreen applied and aftercare instructions given to patient    Related Medications Aminolevulinic Acid HCl 10 % GEL 2,000 mg  Dorathy Daft, RMA  I personally debrided area prior to application of aminolevulinic acid Willeen Niece  Documentation: I have reviewed the above documentation for accuracy and completeness, and I agree with the above.  Willeen Niece, MD

## 2023-09-23 NOTE — Patient Instructions (Signed)
 Ameluz/Red Light Treatment Common Side Effects  - Burning/stinging, which may be severe and last up to 24-72 hours after your treatment  - Redness, swelling and/or peeling which may last up to 4 weeks  - Scaling/crusting which may last up to 2 weeks  - Sun sensitivity (you MUST avoid sun exposure for 48-72 hours after treatment)  Care Instructions  - Okay to wash with soap and water and shampoo as normal  - If needed, you can do a cold compress (ex. Ice packs) for comfort  - If okay with your Primary Doctor, you may use analgesics such as Tylenol every 4-6 hours, not to exceed recommended dose  - You may apply Cerave Healing Ointment, Vaseline or Aquaphor  - If you have a lot of swelling you may take a Benadryl to help with this (this may cause drowsiness)  Sun Precautions  - Wear a wide brim hat for the next week if outside  - Wear a sunblock with zinc or titanium dioxide at least SPF 50 daily   We will recheck you in 10-12 weeks. If any problems, please call the office and ask to speak with a nurse.

## 2023-09-25 ENCOUNTER — Encounter: Payer: Self-pay | Admitting: Family Medicine

## 2023-09-30 ENCOUNTER — Ambulatory Visit (INDEPENDENT_AMBULATORY_CARE_PROVIDER_SITE_OTHER): Payer: Medicare Other

## 2023-09-30 VITALS — Ht 72.0 in | Wt 236.0 lb

## 2023-09-30 DIAGNOSIS — Z0000 Encounter for general adult medical examination without abnormal findings: Secondary | ICD-10-CM

## 2023-09-30 NOTE — Progress Notes (Signed)
 Subjective:   Richard [name] is a 83 y.o. who presents for a Medicare Wellness preventive visit.  Visit Complete: Virtual I connected with  Richard [name] on 09/30/23 by a audio enabled telemedicine application and verified that I am speaking with the correct person using two identifiers.  Patient Location: Home  Provider Location: Home Office  I discussed the limitations of evaluation and management by telemedicine. The patient expressed understanding and agreed to proceed.  Vital Signs: Because this visit was a virtual/telehealth visit, some criteria may be missing or patient reported. Any vitals not documented were not able to be obtained and vitals that have been documented are patient reported.    Persons Participating in Visit: Patient.  AWV Questionnaire: No: Patient Medicare AWV questionnaire was not completed prior to this visit.  Cardiac Risk Factors include: advanced age (>2men, >27 women);male gender;diabetes mellitus;hypertension     Objective:    Today's Vitals   09/30/23 0855  Weight: 236 lb (107 kg)  Height: 6' (1.829 m)   Body mass index is 32.01 kg/m.     09/30/2023    9:10 AM 08/08/2023   10:53 AM 07/11/2023    9:30 AM 06/09/2023    1:58 PM 05/08/2023   11:18 AM 04/15/2023    7:32 AM 03/07/2023    8:29 AM  Advanced Directives  Does Patient Have a Medical Advance Directive? Yes Yes Yes Yes Yes Yes Yes  Type of Estate agent of [place];Living will Healthcare Power of [place];Living will   Healthcare Power of [place];Living will Healthcare Power of [place];Living will Living will;Healthcare Power of Attorney  Does patient want to make changes to medical advance directive? No - Patient declined   No - Patient declined  Yes (ED - Information included in AVS)   Copy of Healthcare Power of Attorney in Chart? Yes - validated most recent copy scanned in chart (See row information)  Yes - validated most recent copy scanned in chart (See row  information) Yes - validated most recent copy scanned in chart (See row information) Yes - validated most recent copy scanned in chart (See row information) Yes - validated most recent copy scanned in chart (See row information) Yes - validated most recent copy scanned in chart (See row information)  Would patient like information on creating a medical advance directive?   No - Patient declined No - Patient declined No - Patient declined  No - Patient declined    Current Medications (verified) Outpatient Encounter Medications as of 09/30/2023  Medication Sig   acetaminophen (TYLENOL) 325 MG tablet Take 650 mg by mouth at bedtime as needed for moderate pain (pain score 4-6) or headache.   allopurinol (ZYLOPRIM) 300 MG tablet Take 450 mg by mouth daily.   aluminum hydroxide-magnesium carbonate (GAVISCON) 95-358 MG/15ML SUSP Take 15 mLs by mouth as needed for indigestion or heartburn.   amoxicillin (AMOXIL) 500 MG capsule Take 500 mg by mouth 3 (three) times daily.   apixaban (ELIQUIS) 2.5 MG TABS tablet TAKE 1 TABLET BY MOUTH TWICE A DAY   Ascorbic Acid (VITAMIN C PO) Take 1 tablet by mouth daily with breakfast.   atorvastatin (LIPITOR) 80 MG tablet TAKE 1 TABLET BY MOUTH EVERYDAY AT BEDTIME   Azelastine HCl 137 MCG/SPRAY SOLN PLACE 2 SPRAYS INTO BOTH NOSTRILS AT BEDTIME AS NEEDED FOR RHINITIS OR ALLERGIES.   Coenzyme Q10 (CO Q-10 PO) Take 100 mg by mouth daily.   Dulaglutide (TRULICITY) 3 MG/0.5ML SOPN Inject 3 mg into  the skin once a week.   esomeprazole (NEXIUM) 40 MG capsule Take 1 capsule (40 mg total) by mouth daily.   ezetimibe (ZETIA) 10 MG tablet Take 1 tablet (10 mg total) by mouth daily.   FARXIGA 10 MG TABS tablet Take 10 mg by mouth in the morning.   fenofibrate 160 MG tablet TAKE 1 TABLET BY MOUTH EVERY DAY   folic acid (FOLVITE) 1 MG tablet TAKE 2 TABLETS BY MOUTH EVERY DAY   FREESTYLE LITE test strip USE TO TEST BLOOD SUGAR ONCE A DAY. DX CODE: E11.9   gabapentin (NEURONTIN) 100 MG  capsule Take 100 mg by mouth at bedtime. PRN (Patient not taking: Reported on 09/11/2023)   glimepiride (AMARYL) 2 MG tablet TAKE 1 TABLET (2 MG TOTAL) BY MOUTH IN THE MORNING AND AT BEDTIME.   icosapent Ethyl (VASCEPA) 1 g capsule Take 2 capsules (2 g total) by mouth 2 (two) times daily.   Lancets (FREESTYLE) lancets USE ONCE A DAY TO CHECK BLOOD SUGAR.   Menthol-Camphor (ICY HOT ADVANCED PAIN RELIEF EX) Apply 1 application  topically daily as needed (to painful sites).   metoprolol succinate (TOPROL-XL) 25 MG 24 hr tablet TAKE 1 TABLET (25 MG TOTAL) BY MOUTH DAILY.   nitroGLYCERIN (NITROSTAT) 0.4 MG SL tablet PLACE 1 TABLET UNDER THE TONGUE EVERY 5 MINUTES AS NEEDED FOR CHEST PAIN.   potassium chloride SA (KLOR-CON M20) 20 MEQ tablet TAKE 2 TABLETS BY MOUTH EVERY DAY   pregabalin (LYRICA) 100 MG capsule TAKE 1 CAPSULE (100 MG TOTAL) BY MOUTH DAILY. AT NOON   pregabalin (LYRICA) 150 MG capsule TAKE 1 CAPSULE (150 MG TOTAL) BY MOUTH AT BEDTIME. (TAKE 100MG  DAILY AT NOON)   psyllium (METAMUCIL) 58.6 % powder Take 1 packet by mouth daily as needed (for constipation- mix and drink).   torsemide (DEMADEX) 20 MG tablet TAKE 2 TAB BY MOUTH IN MORNING TAKE EXTRA TABLET FOR WEIGHT GAIN MORE THAN 2-3LBS IN 24HR FOR 90DAY   No facility-administered encounter medications on file as of 09/30/2023.    Allergies (verified) Ace inhibitors, Benazepril, Entresto [sacubitril-valsartan], Hctz [hydrochlorothiazide], and Aspirin   History: Past Medical History:  Diagnosis Date   Anemia    Arthritis    hips   Axillary adenopathy 02/25/2017   Bradycardia    a. holter monitor has demonstrated HRs in 30s and Weinkibach    CAD (coronary artery disease)    a. s/p CABG 2001  b.  07/28/2017 cath:   Severe three-vessel native CAD with total occlusion of LAD, ramus intermedius, first OM and RCA, patent RIMA to PDA, LIMA to LAD, sequential SVG to ramus intermedius and first OM.     Chronic lower back pain    Diffuse  large B cell lymphoma (HCC)    Diverticulosis    Esophageal stricture    GERD (gastroesophageal reflux disease)    History of gout    HTN (hypertension)    Mixed hyperlipidemia    OSA on CPAP    with 2L O2 at night   Pancytopenia (HCC)    a. related to chemo therapy for B cell lymphoma   Peptic stricture of esophagus    Presence of permanent cardiac pacemaker    sees Dr. Clayborn Bigness pacemaker   SCC (squamous cell carcinoma) 01/31/2021   R zygoma, EDC   SCC (squamous cell carcinoma) 01/31/2021   L post ankle, EDC   SCC (squamous cell carcinoma) 01/31/2021   L popliteal, EDC   Severe aortic stenosis  Spinal stenosis    Squamous cell carcinoma of skin 03/22/2009   Right mandible. SCCis, hypertrophic.    Squamous cell carcinoma of skin 12/25/2021   R lateral cheek, EDC   Squamous cell carcinoma of skin 12/25/2021   R postauricular neck, EDC   Squamous cell carcinoma of skin 12/25/2021   R forearm sup, EDC   Squamous cell carcinoma of skin 12/25/2021   R wrist, EDC   Squamous cell carcinoma of skin 08/27/2023   Left thenar hand dorsum, EDC   Type II diabetes mellitus (HCC)    Wears dentures    partial upper   Past Surgical History:  Procedure Laterality Date   APPENDECTOMY  ~ 1952   BACK SURGERY     CATARACT EXTRACTION W/PHACO Left 11/06/2016   Procedure: CATARACT EXTRACTION PHACO AND INTRAOCULAR LENS PLACEMENT (IOC);  Surgeon: Sallee Lange, MD;  Location: [facility] ORS;  Service: Ophthalmology;  Laterality: Left;  Lot # [id number] H [place]: 01:09.4 AP%:25.2 CDE: 30.64   CATARACT EXTRACTION W/PHACO Right 12/04/2016   Procedure: CATARACT EXTRACTION PHACO AND INTRAOCULAR LENS PLACEMENT (IOC);  Surgeon: Sallee Lange, MD;  Location: [facility] ORS;  Service: Ophthalmology;  Laterality: Right;  [place] 1:25.9 AP% 24.1 CDE 39.10 Fluid Pack lot # [id number]   COLONOSCOPY W/ BIOPSIES AND POLYPECTOMY  07/02/2011   CORONARY ANGIOPLASTY  07/02/1991   CORONARY ANGIOPLASTY WITH STENT  PLACEMENT  05/31/1997   "1"   CORONARY ARTERY BYPASS GRAFT  03/01/2000   "CABG X5"   ECTROPION REPAIR Right 09/01/2018   Procedure: REPAIR OF ECTROPION BILATERAL upper and lower;  Surgeon: Imagene Riches, MD;  Location: [facility];  Service: Ophthalmology;  Laterality: Right;  Diabetic - oral meds sleep apnea   ESOPHAGEAL DILATION  X 3-4   Dr. Victorino Dike; "last one was in the 1990's"   ESOPHAGOGASTRODUODENOSCOPY     multiple   FLEXIBLE SIGMOIDOSCOPY     multiple   HYDRADENITIS EXCISION Left 02/25/2017   Procedure: EXCISION DEEP LEFT AXILLARY LYMPH NODE;  Surgeon: Claud Kelp, MD;  Location: [facility] OR;  Service: General;  Laterality: Left;   INTRAOPERATIVE TRANSTHORACIC ECHOCARDIOGRAM N/A 09/09/2017   Procedure: INTRAOPERATIVE TRANSTHORACIC ECHOCARDIOGRAM;  Surgeon: Kathleene Hazel, MD;  Location: [facility] OR;  Service: Open Heart Surgery;  Laterality: N/A;   KNEE ARTHROSCOPY Left 07/01/2009   meniscus repair   LEFT HEART CATHETERIZATION WITH CORONARY/GRAFT ANGIOGRAM N/A 03/16/2014   Procedure: LEFT HEART CATHETERIZATION WITH Isabel Caprice;  Surgeon: Kathleene Hazel, MD;  Location: [facility] CATH LAB;  Service: Cardiovascular;  Laterality: N/A;   LUMBAR LAMINECTOMY/DECOMPRESSION MICRODISCECTOMY Right 06/17/2013   Procedure: LUMBAR LAMINECTOMY MICRODISCECTOMY L4-L5 RIGHT EXCISION OF SYNOVIAL CYST RIGHT   (1 LEVEL) RIGHT PARTIAL FACETECTOMY;  Surgeon: Jacki Cones, MD;  Location: [facility] ORS;  Service: Orthopedics;  Laterality: Right;   MYELOGRAM  04/06/2013   lumbar, Dr Darrelyn Hillock   ORBITAL LESION EXCISION Right 09/01/2018   Procedure: ORBITOTOMY WITHOUT BONE FLAP WITH REMOVAL OF LESION RIGHT;  Surgeon: Imagene Riches, MD;  Location: [facility];  Service: Ophthalmology;  Laterality: Right;   PACEMAKER IMPLANT N/A 09/10/2017   Procedure: PACEMAKER IMPLANT;  Surgeon: Duke Salvia, MD;  Location: [facility] INVASIVE CV LAB;  Service: Cardiovascular;  Laterality: N/A;    PANENDOSCOPY     PERIPHERAL VASCULAR BALLOON ANGIOPLASTY Left 03/16/2021   Procedure: PERIPHERAL VASCULAR BALLOON ANGIOPLASTY;  Surgeon: Leonie Douglas, MD;  Location: [facility] INVASIVE CV LAB;  Service: Cardiovascular;  Laterality: Left;  subclavian  vein   PORTACATH PLACEMENT  N/A 03/06/2017   Procedure: INSERTION PORT-A-CATH AND ASPIRATE SEROMA LEFT AXILLA;  Surgeon: Claud Kelp, MD;  Location: [facility] OR;  Service: General;  Laterality: N/A;   RIGHT/LEFT HEART CATH AND CORONARY/GRAFT ANGIOGRAPHY N/A 07/28/2017   Procedure: RIGHT/LEFT HEART CATH AND CORONARY/GRAFT ANGIOGRAPHY;  Surgeon: Tonny Bollman, MD;  Location: [facility] INVASIVE CV LAB;  Service: Cardiovascular;  Laterality: N/A;   SHOULDER SURGERY Right 08/30/2010   screws placed; "tendons tore off"   SKIN CANCER EXCISION Right    "neck"   TONSILLECTOMY  ~ 1954   TOTAL KNEE ARTHROPLASTY Left 06/29/2019   Procedure: TOTAL KNEE ARTHROPLASTY;  Surgeon: Durene Romans, MD;  Location: [facility]S;  Service: Orthopedics;  Laterality: Left;  70 mins   TRANSCATHETER AORTIC VALVE REPLACEMENT, TRANSFEMORAL N/A 09/09/2017   Procedure: TRANSCATHETER AORTIC VALVE REPLACEMENT, TRANSFEMORAL;  Surgeon: Kathleene Hazel, MD;  Location: [facility] OR;  Service: Open Heart Surgery;  Laterality: N/A;   UPPER EXTREMITY VENOGRAPHY Left 02/15/2021   Procedure: CENTRAL VENO;  Surgeon: Leonie Douglas, MD;  Location: [facility] INVASIVE CV LAB;  Service: Cardiovascular;  Laterality: Left;   UPPER EXTREMITY VENOGRAPHY N/A 03/16/2021   Procedure: UPPER EXTREMITY VENOGRAPHY;  Surgeon: Leonie Douglas, MD;  Location: [facility] INVASIVE CV LAB;  Service: Cardiovascular;  Laterality: N/A;   UPPER GI ENDOSCOPY  07/02/2011   Gastritis; Dr Leone Payor   VASECTOMY     wireless pacemaker placed     Family History  Problem Relation Age of Onset   Stroke Father    Hypertension Father    Pancreatic cancer Mother    Diabetes Maternal Grandmother    Stroke Maternal Grandmother    Heart attack  Paternal Grandmother    Colon cancer Neg Hx    Esophageal cancer Neg Hx    Rectal cancer Neg Hx    Stomach cancer Neg Hx    Ulcers Neg Hx    Social History   Socioeconomic History   Marital status: Divorced    Spouse name: Not on file   Number of children: 2   Years of education: college   Highest education level: Not on file  Occupational History    Employer: RETIRED  Tobacco Use   Smoking status: Never   Smokeless tobacco: Never  Vaping Use   Vaping status: Never Used  Substance and Sexual Activity   Alcohol use: No    Alcohol/week: 0.0 standard drinks of alcohol    Comment: "last drink was in 2012"( 03/15/2014)   Drug use: No   Sexual activity: Not Currently  Other Topics Concern   Not on file  Social History Narrative   ** Merged History Encounter **       Divorced, lives with a roommate. 1 son one daughter 3-4 caffeinated beverages daily Right-handed. 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   Social Drivers of Corporate investment banker Strain: Low Risk  (09/30/2023)   Overall Financial Resource Strain (CARDIA)    Difficulty of Paying Living Expenses: Not hard at all  Food Insecurity: No Food Insecurity (09/30/2023)   Hunger Vital Sign    Worried About Running Out of Food in the Last Year: Never true    Ran Out of Food in the Last Year: Never true  Transportation Needs: No Transportation Needs (09/30/2023)   PRAPARE - Administrator, Civil Service (Medical): No  Lack of Transportation (Non-Medical): No  Physical Activity: Sufficiently Active (09/30/2023)   Exercise Vital Sign    Days of Exercise per Week: 3 days    Minutes of Exercise per Session: 150+ min  Stress: No Stress Concern Present (09/30/2023)   [employer] of Occupational Health - Occupational Stress Questionnaire    Feeling of Stress : Not at all  Social  Connections: Moderately Integrated (09/30/2023)   Social Connection and Isolation Panel [NHANES]    Frequency of Communication with Friends and Family: More than three times a week    Frequency of Social Gatherings with Friends and Family: More than three times a week    Attends Religious Services: More than 4 times per year    Active Member of [employer] or Organizations: Yes    Attends Engineer, structural: More than 4 times per year    Marital Status: Divorced    Tobacco Counseling Counseling given: Not Answered    Clinical Intake:  Pre-visit preparation completed: Yes  Pain : No/denies pain     BMI - recorded: 32.01 Nutritional Status: BMI > 30  Obese Nutritional Risks: None Diabetes: Yes CBG done?: Yes (CBG 155 per patient) CBG resulted in Enter/ Edit results?: Yes Did pt. bring in CBG monitor from home?: No  Lab Results  Component Value Date   HGBA1C 9.1 (H) 04/10/2023   HGBA1C 7.1 (H) 11/26/2022   HGBA1C 8.1 (H) 08/28/2022     How often do you need to have someone help you when you read instructions, pamphlets, or other written materials from your doctor or pharmacy?: 1 - Never  Interpreter Needed?: No  Information entered by :: Theresa Mulligan LPN   Activities of Daily Living     09/30/2023    9:09 AM 04/16/2023   11:00 AM  In your present state of health, do you have any difficulty performing the following activities:  Hearing? 0 0  Vision? 0 1  Difficulty concentrating or making decisions? 0 0  Walking or climbing stairs? 0   Dressing or bathing? 0   Doing errands, shopping? 0 0  Preparing Food and eating ? N   Using the Toilet? N   In the past six months, have you accidently leaked urine? N   Do you have problems with loss of bowel control? N   Managing your Medications? N   Managing your Finances? N   Housekeeping or managing your Housekeeping? N     Patient Care Team: Zola Button, Grayling Congress, DO as PCP - General (Family Medicine) Rollene Rotunda, MD as PCP - Cardiology (Cardiology) Duke Salvia, MD as PCP - Electrophysiology (Cardiology) Dingeldein, Viviann Spare, MD (Ophthalmology) Oretha Milch, MD as Consulting Physician (Pulmonary Disease) Josph Macho, MD as Consulting Physician (Oncology) Rollene Rotunda, MD as Consulting Physician (Cardiology) Parrett, Virgel Bouquet, NP as Nurse Practitioner (Pulmonary Disease) [facility] (Dermatology) Ranee Gosselin, MD as Consulting Physician (Orthopedic Surgery) [facility] Henrene Pastor, RPH-CPP (Pharmacist) Mosetta Pigeon, MD (Nephrology) Beatrix Shipper, MD as Referring Physician (Radiology)  Indicate any recent Medical Services you may have received from other than [facility] providers in the past year (date may be approximate).     Assessment:   This is a routine wellness examination for Zohan.  Hearing/Vision screen Hearing Screening - Comments:: Denies hearing difficulties   Vision Screening - Comments:: Wears rx glasses - up to date with routine eye exams with  [facility]   Goals Addressed  This Visit's Progress     Remain active (pt-stated)         Depression Screen     09/30/2023    9:09 AM 09/17/2022    2:32 PM 09/06/2021    1:07 PM 07/26/2021    8:35 AM 01/11/2020   11:43 AM 11/22/2019   10:42 AM 10/21/2018    4:10 PM  PHQ 2/9 Scores  PHQ - 2 Score 0 0 0 0 0 0 0    Fall Risk     09/30/2023    9:09 AM 09/17/2022    2:30 PM 09/06/2021    1:06 PM 06/19/2021    2:23 PM 01/11/2020   11:43 AM  Fall Risk   Falls in the past year? 0 0 0 0 1  Number falls in past yr: 0 0 0 0 0  Injury with Fall? 0 0 0 0 0  Risk for fall due to : No Fall Risks No Fall Risks No Fall Risks History of fall(s);Impaired mobility   Follow up Falls prevention discussed;Falls evaluation completed Falls evaluation completed Falls evaluation completed Falls evaluation completed     MEDICARE RISK AT HOME:  Medicare Risk at Home Any  stairs in or around the home?: Yes If so, are there any without handrails?: No Home free of loose throw rugs in walkways, pet beds, electrical cords, etc?: Yes Adequate lighting in your home to reduce risk of falls?: Yes Life alert?: Yes Use of a cane, walker or w/c?: No Grab bars in the bathroom?: Yes Shower chair or bench in shower?: No Elevated toilet seat or a handicapped toilet?: No  TIMED UP AND GO:  Was the test performed?  No  Cognitive Function: 6CIT completed    03/15/2016    2:34 PM  MMSE - Mini Mental State Exam  Orientation to time 5  Orientation to Place 5  Registration 3  Attention/ Calculation 5  Recall 2  Language- name 2 objects 2  Language- repeat 1  Language- follow 3 step command 3  Language- read & follow direction 1  Write a sentence 1  Copy design 1  Total score 29        09/30/2023    9:11 AM 09/17/2022    2:49 PM 09/06/2021    1:13 PM  6CIT Screen  What Year? 0 points 0 points 0 points  What month? 0 points 0 points 0 points  What time? 0 points 0 points 0 points  Count back from 20 0 points 0 points 0 points  Months in reverse 0 points 0 points 0 points  Repeat phrase 0 points 0 points 0 points  Total Score 0 points 0 points 0 points    Immunizations Immunization History  Administered Date(s) Administered   Fluad Quad(high Dose 65+) 03/21/2021   Influenza Split 06/12/2011, 05/31/2013   Influenza Whole 05/01/2012   Influenza, High Dose Seasonal PF 02/09/2015, 03/12/2017, 03/09/2018, 03/02/2020   Influenza-Unspecified 02/11/2014, 02/29/2016, 02/25/2019, 04/01/2022   Moderna Covid-19 Fall Seasonal Vaccine 31yrs & older 04/02/2022   Moderna Sars-Covid-2 Vaccination 08/06/2019, 08/30/2019, 09/30/2019, 04/21/2020   Pneumococcal Conjugate-13 09/18/2015   Pneumococcal Polysaccharide-23 06/20/2013   Respiratory Syncytial Virus Vaccine,Recomb Aduvanted(Arexvy) 03/20/2023   Td 03/20/2010, 03/16/2013, 09/29/2017   Td (Adult), 2 Lf Tetanus Toxid,  Preservative Free 03/20/2010, 03/16/2013, 09/29/2017   Zoster Recombinant(Shingrix) 11/21/2016, 09/25/2017    Screening Tests Health Maintenance  Topic Date Due   COVID-19 Vaccine (6 - 2024-25 season) 03/02/2023   FOOT EXAM  03/13/2023  HEMOGLOBIN A1C  10/09/2023   INFLUENZA VACCINE  01/30/2024   Diabetic kidney evaluation - Urine ACR  08/06/2024   OPHTHALMOLOGY EXAM  08/18/2024   Diabetic kidney evaluation - eGFR measurement  09/01/2024   Medicare Annual Wellness (AWV)  09/29/2024   DTaP/Tdap/Td (7 - Tdap) 09/30/2027   Pneumonia Vaccine 54+ Years old  Completed   Zoster Vaccines- Shingrix  Completed   HPV VACCINES  Aged Out   Colonoscopy  Discontinued    Health Maintenance  Health Maintenance Due  Topic Date Due   COVID-19 Vaccine (6 - 2024-25 season) 03/02/2023   FOOT EXAM  03/13/2023   Health Maintenance Items Addressed:    Additional Screening:  Vision Screening: Recommended annual ophthalmology exams for early detection of glaucoma and other disorders of the eye.  Dental Screening: Recommended annual dental exams for proper oral hygiene  Community Resource Referral / Chronic Care Management: CRR required this visit?  No   CCM required this visit?  No     Plan:     I have personally reviewed and noted the following in the patient's chart:   Medical and social history Use of alcohol, tobacco or illicit drugs  Current medications and supplements including opioid prescriptions. Patient is not currently taking opioid prescriptions. Functional ability and status Nutritional status Physical activity Advanced directives List of other physicians Hospitalizations, surgeries, and ER visits in previous 12 months Vitals Screenings to include cognitive, depression, and falls Referrals and appointments  In addition, I have reviewed and discussed with patient certain preventive protocols, quality metrics, and best practice recommendations. A written personalized  care plan for preventive services as well as general preventive health recommendations were provided to patient.     Tillie Rung, LPN   4/0/9811   After Visit Summary: (MyChart) Due to this being a telephonic visit, the after visit summary with patients personalized plan was offered to patient via MyChart   Notes: Nothing significant to report at this time.

## 2023-09-30 NOTE — Patient Instructions (Addendum)
 Richard [name] , Thank you for taking time to come for your Medicare Wellness Visit. I appreciate your ongoing commitment to your health goals. Please review the following plan we discussed and let me know if I can assist you in the future.   Referrals/Orders/Follow-Ups/Clinician Recommendations:   This is a list of the screening recommended for you and due dates:  Health Maintenance  Topic Date Due   COVID-19 Vaccine (6 - 2024-25 season) 03/02/2023   Complete foot exam   03/13/2023   Hemoglobin A1C  10/09/2023   Flu Shot  01/30/2024   Yearly kidney health urinalysis for diabetes  08/06/2024   Eye exam for diabetics  08/18/2024   Yearly kidney function blood test for diabetes  09/01/2024   Medicare Annual Wellness Visit  09/29/2024   DTaP/Tdap/Td vaccine (7 - Tdap) 09/30/2027   Pneumonia Vaccine  Completed   Zoster (Shingles) Vaccine  Completed   HPV Vaccine  Aged Out   Colon Cancer Screening  Discontinued    Advanced directives: (In Chart) A copy of your advanced directives are scanned into your chart should your provider ever need it.  Next Medicare Annual Wellness Visit scheduled for next year: Yes

## 2023-10-02 ENCOUNTER — Other Ambulatory Visit: Payer: Self-pay | Admitting: Family Medicine

## 2023-10-02 MED ORDER — TRULICITY 3 MG/0.5ML ~~LOC~~ SOAJ: 3.0000 mg | SUBCUTANEOUS | 0 refills | Status: DC

## 2023-10-02 NOTE — Telephone Encounter (Signed)
 Copied from CRM [id number]. Topic: Clinical - Medication Refill >> Oct 02, 2023  9:15 AM Saverio Danker wrote: Most Recent Primary Care Visit:  Provider: Tillie Rung  Department: [facility]  Visit Type: MEDICARE AWV, SEQUENTIAL  Date: 09/30/2023  Medication: Dulaglutide (TRULICITY) 3 MG/0.5ML SOPN  Has the patient contacted their pharmacy? Yes (Agent: If no, request that the patient contact the pharmacy for the refill. If patient does not wish to contact the pharmacy document the reason why and proceed with request.) (Agent: If yes, when and what did the pharmacy advise?)  Is this the correct pharmacy for this prescription? Yes If no, delete pharmacy and type the correct one.  This is the patient's preferred pharmacy:    [facility] [place] [facility] [address], [address] [place] [address] Phone: [phone] Fax: [phone]   Has the prescription been filled recently? No  Is the patient out of the medication? Yes  Has the patient been seen for an appointment in the last year OR does the patient have an upcoming appointment? Yes  Can we respond through MyChart? No  Agent: Please be advised that Rx refills may take up to 3 business days. We ask that you follow-up with your pharmacy.

## 2023-10-07 ENCOUNTER — Inpatient Hospital Stay: Attending: Hematology & Oncology

## 2023-10-07 ENCOUNTER — Encounter: Payer: Self-pay | Admitting: Family

## 2023-10-07 ENCOUNTER — Inpatient Hospital Stay

## 2023-10-07 ENCOUNTER — Other Ambulatory Visit: Payer: Self-pay | Admitting: Cardiology

## 2023-10-07 ENCOUNTER — Inpatient Hospital Stay (HOSPITAL_BASED_OUTPATIENT_CLINIC_OR_DEPARTMENT_OTHER): Admitting: Family

## 2023-10-07 VITALS — BP 118/64 | HR 67 | Temp 97.5°F | Resp 18 | Ht 72.0 in | Wt 239.1 lb

## 2023-10-07 DIAGNOSIS — D519 Vitamin B12 deficiency anemia, unspecified: Secondary | ICD-10-CM

## 2023-10-07 DIAGNOSIS — E611 Iron deficiency: Secondary | ICD-10-CM | POA: Diagnosis not present

## 2023-10-07 DIAGNOSIS — Z950 Presence of cardiac pacemaker: Secondary | ICD-10-CM | POA: Diagnosis not present

## 2023-10-07 DIAGNOSIS — C8332 Diffuse large B-cell lymphoma, intrathoracic lymph nodes: Secondary | ICD-10-CM | POA: Insufficient documentation

## 2023-10-07 DIAGNOSIS — D509 Iron deficiency anemia, unspecified: Secondary | ICD-10-CM

## 2023-10-07 DIAGNOSIS — D510 Vitamin B12 deficiency anemia due to intrinsic factor deficiency: Secondary | ICD-10-CM | POA: Insufficient documentation

## 2023-10-07 DIAGNOSIS — D518 Other vitamin B12 deficiency anemias: Secondary | ICD-10-CM

## 2023-10-07 DIAGNOSIS — G629 Polyneuropathy, unspecified: Secondary | ICD-10-CM | POA: Insufficient documentation

## 2023-10-07 DIAGNOSIS — I442 Atrioventricular block, complete: Secondary | ICD-10-CM | POA: Diagnosis not present

## 2023-10-07 DIAGNOSIS — D508 Other iron deficiency anemias: Secondary | ICD-10-CM

## 2023-10-07 DIAGNOSIS — Z79899 Other long term (current) drug therapy: Secondary | ICD-10-CM | POA: Diagnosis not present

## 2023-10-07 LAB — CMP (CANCER CENTER ONLY)
ALT: 17 U/L (ref 0–44)
AST: 20 U/L (ref 15–41)
Albumin: 4.5 g/dL (ref 3.5–5.0)
Alkaline Phosphatase: 65 U/L (ref 38–126)
Anion gap: 9 (ref 5–15)
BUN: 33 mg/dL — ABNORMAL HIGH (ref 8–23)
CO2: 33 mmol/L — ABNORMAL HIGH (ref 22–32)
Calcium: 10 mg/dL (ref 8.9–10.3)
Chloride: 99 mmol/L (ref 98–111)
Creatinine: 1.76 mg/dL — ABNORMAL HIGH (ref 0.61–1.24)
GFR, Estimated: 37 mL/min — ABNORMAL LOW (ref >60)
Glucose, Bld: 201 mg/dL — ABNORMAL HIGH (ref 70–99)
Potassium: 4.5 mmol/L (ref 3.5–5.1)
Sodium: 141 mmol/L (ref 135–145)
Total Bilirubin: 0.6 mg/dL (ref 0.0–1.2)
Total Protein: 7.2 g/dL (ref 6.5–8.1)

## 2023-10-07 LAB — CBC WITH DIFFERENTIAL (CANCER CENTER ONLY)
Abs Immature Granulocytes: 0.04 10*3/uL (ref 0.00–0.07)
Basophils Absolute: 0 10*3/uL (ref 0.0–0.1)
Basophils Relative: 1 %
Eosinophils Absolute: 0.2 10*3/uL (ref 0.0–0.5)
Eosinophils Relative: 2 %
HCT: 44.3 % (ref 39.0–52.0)
Hemoglobin: 14.4 g/dL (ref 13.0–17.0)
Immature Granulocytes: 1 %
Lymphocytes Relative: 22 %
Lymphs Abs: 1.6 10*3/uL (ref 0.7–4.0)
MCH: 29.9 pg (ref 26.0–34.0)
MCHC: 32.5 g/dL (ref 30.0–36.0)
MCV: 92.1 fL (ref 80.0–100.0)
Monocytes Absolute: 0.6 10*3/uL (ref 0.1–1.0)
Monocytes Relative: 8 %
Neutro Abs: 5 10*3/uL (ref 1.7–7.7)
Neutrophils Relative %: 66 %
Platelet Count: 207 10*3/uL (ref 150–400)
RBC: 4.81 MIL/uL (ref 4.22–5.81)
RDW: 16.9 % — ABNORMAL HIGH (ref 11.5–15.5)
WBC Count: 7.5 10*3/uL (ref 4.0–10.5)
nRBC: 0 % (ref 0.0–0.2)

## 2023-10-07 LAB — IRON AND IRON BINDING CAPACITY (CC-WL,HP ONLY)
Iron: 81 ug/dL (ref 45–182)
Saturation Ratios: 21 % (ref 17.9–39.5)
TIBC: 388 ug/dL (ref 250–450)
UIBC: 307 ug/dL (ref 117–376)

## 2023-10-07 LAB — FERRITIN: Ferritin: 1384 ng/mL — ABNORMAL HIGH (ref 24–336)

## 2023-10-07 LAB — VITAMIN B12: Vitamin B-12: 220 pg/mL (ref 180–914)

## 2023-10-07 MED ORDER — CYANOCOBALAMIN 1000 MCG/ML IJ SOLN
1000.0000 ug | Freq: Once | INTRAMUSCULAR | Status: AC
  Administered 2023-10-07: 1000 ug via INTRAMUSCULAR
  Filled 2023-10-07: qty 1

## 2023-10-07 NOTE — Progress Notes (Signed)
 Hematology and Oncology Follow Up Visit  Richard [name] [id number] [birth year] 83 y.o. 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 He is doing well overall and has no complaints at this time.  He is golfing MWF every week.  No fever, chills, n/v, cough, rash, dizziness, SOB, chest pain, palpitations, abdominal pain or changes in bowel or bladder habits.  No blood loss. No petechiae. Normal bruising on arms.  No swelling in his extremities.  Neuropathy in his feet is unchanged from baseline.  No falls or syncope reported.  Appetite and hydration are good. Weight is stable at 239 lbs.   ECOG Performance Status: 1 - Symptomatic but completely ambulatory  Medications:  Allergies as of 10/07/2023       Reactions   Ace Inhibitors Swelling, Other (See Comments)   Angioedema   Benazepril Swelling, Other (See Comments)   Angioedema; he is not a candidate for any angiotensin receptor blockers because of this significant allergic reaction. Because of a history of documented adverse serious drug reaction;Medi Alert bracelet  is recommended   Entresto [sacubitril-valsartan] Swelling, Other (See Comments)   First-in-Class Angiotensin Receptor Neprilysin Inhibitor- Med was "red-flagged" by the patient's pharmacy for him to NOT take!   Hctz [hydrochlorothiazide] Anaphylaxis, Swelling   Tongue and lip swelling   Aspirin Other (See Comments)   Gastritis, can aspirin not take 325 mg aspirin        Medication List        Accurate as of October 07, 2023  9:51 AM. If you have any questions, ask your nurse or  doctor.          acetaminophen 325 MG tablet Commonly known as: TYLENOL Take 650 mg by mouth at bedtime as needed for moderate pain (pain score 4-6) or headache.   allopurinol 300 MG tablet Commonly known as: ZYLOPRIM Take 450 mg by mouth daily.   aluminum hydroxide-magnesium carbonate 95-358 MG/15ML Susp Commonly known as: GAVISCON Take 15 mLs by mouth as needed for indigestion or heartburn.   amoxicillin 500 MG capsule Commonly known as: AMOXIL Take 500 mg by mouth 3 (three) times daily.   atorvastatin 80 MG tablet Commonly known as: LIPITOR TAKE 1 TABLET BY MOUTH EVERYDAY AT BEDTIME   Azelastine HCl 137 MCG/SPRAY Soln PLACE 2 SPRAYS INTO BOTH NOSTRILS AT BEDTIME AS NEEDED FOR RHINITIS OR ALLERGIES.   CO Q-10 PO Take 100 mg by mouth daily.   Eliquis 2.5 MG Tabs tablet Generic drug: apixaban TAKE 1 TABLET BY MOUTH TWICE A DAY   esomeprazole 40 MG capsule Commonly known as: NexIUM Take 1 capsule (40 mg total) by mouth daily.   ezetimibe 10 MG tablet Commonly known as: ZETIA Take 1 tablet (10 mg total) by mouth daily.   Farxiga 10 MG Tabs tablet Generic drug: dapagliflozin propanediol Take 10 mg by mouth in the morning.   fenofibrate 160 MG tablet TAKE 1 TABLET BY MOUTH EVERY DAY   folic acid 1 MG tablet Commonly known as: FOLVITE TAKE 2 TABLETS BY MOUTH EVERY  DAY   freestyle lancets USE ONCE A DAY TO CHECK BLOOD SUGAR.   FREESTYLE LITE test strip Generic drug: glucose blood USE TO TEST BLOOD SUGAR ONCE A DAY. DX CODE: E11.9   gabapentin 100 MG capsule Commonly known as: NEURONTIN Take 100 mg by mouth at bedtime. PRN   glimepiride 2 MG tablet Commonly known as: AMARYL TAKE 1 TABLET (2 MG TOTAL) BY MOUTH IN THE MORNING AND AT BEDTIME.   icosapent Ethyl 1 g capsule Commonly known as: Vascepa Take 2 capsules (2 g total) by mouth 2 (two) times daily.   ICY HOT ADVANCED PAIN RELIEF EX Apply 1 application  topically daily as needed (to painful  sites).   Klor-Con M20 20 MEQ tablet Generic drug: potassium chloride SA TAKE 2 TABLETS BY MOUTH EVERY DAY   metoprolol succinate 25 MG 24 hr tablet Commonly known as: TOPROL-XL TAKE 1 TABLET (25 MG TOTAL) BY MOUTH DAILY.   nitroGLYCERIN 0.4 MG SL tablet Commonly known as: NITROSTAT PLACE 1 TABLET UNDER THE TONGUE EVERY 5 MINUTES AS NEEDED FOR CHEST PAIN.   pregabalin 150 MG capsule Commonly known as: LYRICA TAKE 1 CAPSULE (150 MG TOTAL) BY MOUTH AT BEDTIME. (TAKE 100MG  DAILY AT NOON)   pregabalin 100 MG capsule Commonly known as: LYRICA TAKE 1 CAPSULE (100 MG TOTAL) BY MOUTH DAILY. AT NOON   psyllium 58.6 % powder Commonly known as: METAMUCIL Take 1 packet by mouth daily as needed (for constipation- mix and drink).   torsemide 20 MG tablet Commonly known as: DEMADEX TAKE 2 TAB BY MOUTH IN MORNING TAKE EXTRA TABLET FOR WEIGHT GAIN MORE THAN 2-3LBS IN 24HR FOR 90DAY   Trulicity 3 MG/0.5ML Soaj Generic drug: Dulaglutide Inject 3 mg into the skin once a week.   VITAMIN C PO Take 1 tablet by mouth daily with breakfast.        Allergies:  Allergies  Allergen Reactions   Ace Inhibitors Swelling and Other (See Comments)    Angioedema    Benazepril Swelling and Other (See Comments)    Angioedema; he is not a candidate for any angiotensin receptor blockers because of this significant allergic reaction. Because of a history of documented adverse serious drug reaction;Medi Alert bracelet  is recommended   Entresto [Sacubitril-Valsartan] Swelling and Other (See Comments)    First-in-Class Angiotensin Receptor Neprilysin Inhibitor- Med was "red-flagged" by the patient's pharmacy for him to NOT take!   Hctz [Hydrochlorothiazide] Anaphylaxis and Swelling    Tongue and lip swelling    Aspirin Other (See Comments)    Gastritis, can aspirin not take 325 mg aspirin    Past Medical History, Surgical history, Social history, and Family History were reviewed and  updated.  Review of Systems: All other 10 point review of systems is negative.   Physical Exam:  height is 6' (1.829 m) and weight is 239 lb 1.3 oz (108.4 kg). His oral temperature is 97.5 F (36.4 C) (abnormal). His blood pressure is 118/64 and his pulse is 67. His respiration is 18 and oxygen saturation is 97%.   Wt Readings from Last 3 Encounters:  10/07/23 239 lb 1.3 oz (108.4 kg)  09/30/23 236 lb (107 kg)  09/11/23 236 lb (107 kg)    Ocular: Sclerae unicteric, pupils equal, round and reactive to light Ear-nose-throat: Oropharynx clear, dentition fair Lymphatic: No cervical or supraclavicular adenopathy Lungs no rales or rhonchi, good excursion bilaterally Heart regular rate and rhythm, no murmur appreciated Abd soft, nontender, positive bowel sounds MSK no  focal spinal tenderness, no joint edema Neuro: non-focal, well-oriented, appropriate affect Breasts: Deferred   Lab Results  Component Value Date   WBC 7.5 10/07/2023   HGB 14.4 10/07/2023   HCT 44.3 10/07/2023   MCV 92.1 10/07/2023   PLT 207 10/07/2023   Lab Results  Component Value Date   FERRITIN 1,476 (H) 09/02/2023   IRON 82 09/02/2023   TIBC 357 09/02/2023   UIBC 275 09/02/2023   IRONPCTSAT 23 09/02/2023   Lab Results  Component Value Date   RETICCTPCT 1.7 04/07/2023   RBC 4.81 10/07/2023   RETICCTABS 52.1 11/22/2011   No results found for: "KPAFRELGTCHN", "LAMBDASER", "[facility]" Lab Results  Component Value Date   IGA 159 03/09/2012   Lab Results  Component Value Date   ALBUMINELP 4.2 07/27/2018   MSPIKE Not Observed 07/27/2018     Chemistry      Component Value Date/Time   NA 141 10/07/2023 0909   NA 138 02/08/2022 0811   NA 144 06/27/2017 0857   K 4.5 10/07/2023 0909   K 3.9 06/27/2017 0857   CL 99 10/07/2023 0909   CL 103 06/27/2017 0857   CO2 33 (H) 10/07/2023 0909   CO2 26 06/27/2017 0857   BUN 33 (H) 10/07/2023 0909   BUN 46 (H) 02/08/2022 0811   BUN 12 06/27/2017 0857    CREATININE 1.76 (H) 10/07/2023 0909   CREATININE 1.65 (H) 04/18/2020 0956      Component Value Date/Time   CALCIUM 10.0 10/07/2023 0909   CALCIUM 9.2 06/27/2017 0857   ALKPHOS 65 10/07/2023 0909   ALKPHOS 128 (H) 06/27/2017 0857   AST 20 10/07/2023 0909   ALT 17 10/07/2023 0909   ALT 24 06/27/2017 0857   BILITOT 0.6 10/07/2023 0909       Impression and Plan: Richard [name] is a very pleasant 83 yo caucasian gentleman with diffuse large B-cell lymphoma (not "double hit" lymphoma). He completed treatment in February 2019.  He is now on Xgeva every 3 months along with PRN IV iron and IM B12.  We will proceed with B 12 injection.  Iron studies pending. We will replace if needed.  CBC and CMP routed to Dr. Romelle Starcher with nephrology.  Follow-up in 1 month.   Eileen Stanford, NP 4/8/20259:51 AM

## 2023-10-07 NOTE — Patient Instructions (Signed)
 Vitamin B12 Deficiency Vitamin B12 deficiency occurs when the body does not have enough of this important vitamin. The body needs this vitamin: To make red blood cells. To make DNA. This is the genetic material inside cells. To help the nerves work properly so they can carry messages from the brain to the body. Vitamin B12 deficiency can cause health problems, such as not having enough red blood cells in the blood (anemia). This can lead to nerve damage if untreated. What are the causes? This condition may be caused by: Not eating enough foods that contain vitamin B12. Not having enough stomach acid and digestive fluids to properly absorb vitamin B12 from the food that you eat. Having certain diseases that make it hard to absorb vitamin B12. These diseases include Crohn's disease, chronic pancreatitis, and cystic fibrosis. An autoimmune disorder in which the body does not make enough of a protein (intrinsic factor) within the stomach, resulting in not enough absorption of vitamin B12. Having a surgery in which part of the stomach or small intestine is removed. Taking certain medicines that make it hard for the body to absorb vitamin B12. These include: Heartburn medicines, such as antacids and proton pump inhibitors. Some medicines that are used to treat diabetes. What increases the risk? The following factors may make you more likely to develop a vitamin B12 deficiency: Being an older adult. Eating a vegetarian or vegan diet that does not include any foods that come from animals. Eating a poor diet while you are pregnant. Taking certain medicines. Having alcoholism. What are the signs or symptoms? In some cases, there are no symptoms of this condition. If the condition leads to anemia or nerve damage, various symptoms may occur, such as: Weakness. Tiredness (fatigue). Loss of appetite. Numbness or tingling in your hands and feet. Redness and burning of the tongue. Depression,  confusion, or memory problems. Trouble walking. If anemia is severe, symptoms can include: Shortness of breath. Dizziness. Rapid heart rate. How is this diagnosed? This condition may be diagnosed with a blood test to measure the level of vitamin B12 in your blood. You may also have other tests, including: A group of tests that measure certain characteristics of blood cells (complete blood count, CBC). A blood test to measure intrinsic factor. A procedure where a thin tube with a camera on the end is used to look into your stomach or intestines (endoscopy). Other tests may be needed to discover the cause of the deficiency. How is this treated? Treatment for this condition depends on the cause. This condition may be treated by: Changing your eating and drinking habits, such as: Eating more foods that contain vitamin B12. Drinking less alcohol or no alcohol. Getting vitamin B12 injections. Taking vitamin B12 supplements by mouth (orally). Your health care provider will tell you which dose is best for you. Follow these instructions at home: Eating and drinking  Include foods in your diet that come from animals and contain a lot of vitamin B12. These include: Meats and poultry. This includes beef, pork, chicken, [place], and organ meats, such as liver. Seafood. This includes clams, rainbow trout, salmon, tuna, and haddock. Eggs. Dairy foods such as milk, yogurt, and cheese. Eat foods that have vitamin B12 added to them (are fortified), such as ready-to-eat breakfast cereals. Check the label on the package to see if a food is fortified. The items listed above may not be a complete list of foods and beverages you can eat and drink. Contact a dietitian for  more information. Alcohol use Do not drink alcohol if: Your health care provider tells you not to drink. You are pregnant, may be pregnant, or are planning to become pregnant. If you drink alcohol: Limit how much you have to: 0-1 drink a  day for women. 0-2 drinks a day for men. Know how much alcohol is in your drink. In the [place] one drink equals one 12 oz bottle of beer (355 mL), one 5 oz glass of wine (148 mL), or one 1 oz glass of hard liquor (44 mL). General instructions Get vitamin B12 injections if told to by your health care provider. Take supplements only as told by your health care provider. Follow the directions carefully. Keep all follow-up visits. This is important. Contact a health care provider if: Your symptoms come back. Your symptoms get worse or do not improve with treatment. Get help right away: You develop shortness of breath. You have a rapid heart rate. You have chest pain. You become dizzy or you faint. These symptoms may be an emergency. Get help right away. Call 911. Do not wait to see if the symptoms will go away. Do not drive yourself to the hospital. Summary Vitamin B12 deficiency occurs when the body does not have enough of this important vitamin. Common causes include not eating enough foods that contain vitamin B12, not being able to absorb vitamin B12 from the food that you eat, having a surgery in which part of the stomach or small intestine is removed, or taking certain medicines. Eat foods that have vitamin B12 in them. Treatment may include making a change in the way you eat and drink, getting vitamin B12 injections, or taking vitamin B12 supplements. This information is not intended to replace advice given to you by your health care provider. Make sure you discuss any questions you have with your health care provider. Document Revised: 02/09/2021 Document Reviewed: 02/09/2021 Elsevier Patient Education  2024 [employer].

## 2023-10-08 ENCOUNTER — Other Ambulatory Visit: Payer: Self-pay | Admitting: Family Medicine

## 2023-10-08 DIAGNOSIS — I442 Atrioventricular block, complete: Secondary | ICD-10-CM | POA: Diagnosis not present

## 2023-10-08 DIAGNOSIS — Z45010 Encounter for checking and testing of cardiac pacemaker pulse generator [battery]: Secondary | ICD-10-CM | POA: Diagnosis not present

## 2023-10-08 DIAGNOSIS — E781 Pure hyperglyceridemia: Secondary | ICD-10-CM

## 2023-10-21 DIAGNOSIS — Z952 Presence of prosthetic heart valve: Secondary | ICD-10-CM | POA: Diagnosis not present

## 2023-10-21 DIAGNOSIS — I483 Typical atrial flutter: Secondary | ICD-10-CM | POA: Diagnosis not present

## 2023-10-21 DIAGNOSIS — Z792 Long term (current) use of antibiotics: Secondary | ICD-10-CM | POA: Diagnosis not present

## 2023-10-21 DIAGNOSIS — T82897A Other specified complication of cardiac prosthetic devices, implants and grafts, initial encounter: Secondary | ICD-10-CM | POA: Diagnosis not present

## 2023-10-21 DIAGNOSIS — Z950 Presence of cardiac pacemaker: Secondary | ICD-10-CM | POA: Diagnosis not present

## 2023-10-21 DIAGNOSIS — I429 Cardiomyopathy, unspecified: Secondary | ICD-10-CM | POA: Diagnosis not present

## 2023-10-21 DIAGNOSIS — I442 Atrioventricular block, complete: Secondary | ICD-10-CM | POA: Diagnosis not present

## 2023-10-21 DIAGNOSIS — A498 Other bacterial infections of unspecified site: Secondary | ICD-10-CM | POA: Diagnosis not present

## 2023-10-28 DIAGNOSIS — I499 Cardiac arrhythmia, unspecified: Secondary | ICD-10-CM | POA: Diagnosis not present

## 2023-11-01 ENCOUNTER — Other Ambulatory Visit: Payer: Self-pay | Admitting: Family Medicine

## 2023-11-04 ENCOUNTER — Inpatient Hospital Stay: Attending: Hematology & Oncology

## 2023-11-04 ENCOUNTER — Other Ambulatory Visit: Payer: Self-pay

## 2023-11-04 ENCOUNTER — Encounter: Payer: Self-pay | Admitting: Family

## 2023-11-04 ENCOUNTER — Inpatient Hospital Stay (HOSPITAL_BASED_OUTPATIENT_CLINIC_OR_DEPARTMENT_OTHER): Admitting: Family

## 2023-11-04 ENCOUNTER — Inpatient Hospital Stay

## 2023-11-04 VITALS — BP 123/62 | HR 89 | Temp 98.0°F | Resp 19 | Ht 72.0 in | Wt 240.0 lb

## 2023-11-04 DIAGNOSIS — I483 Typical atrial flutter: Secondary | ICD-10-CM

## 2023-11-04 DIAGNOSIS — D519 Vitamin B12 deficiency anemia, unspecified: Secondary | ICD-10-CM | POA: Diagnosis not present

## 2023-11-04 DIAGNOSIS — Z951 Presence of aortocoronary bypass graft: Secondary | ICD-10-CM

## 2023-11-04 DIAGNOSIS — D510 Vitamin B12 deficiency anemia due to intrinsic factor deficiency: Secondary | ICD-10-CM | POA: Insufficient documentation

## 2023-11-04 DIAGNOSIS — I25810 Atherosclerosis of coronary artery bypass graft(s) without angina pectoris: Secondary | ICD-10-CM

## 2023-11-04 DIAGNOSIS — E785 Hyperlipidemia, unspecified: Secondary | ICD-10-CM

## 2023-11-04 DIAGNOSIS — I5042 Chronic combined systolic (congestive) and diastolic (congestive) heart failure: Secondary | ICD-10-CM

## 2023-11-04 DIAGNOSIS — N183 Chronic kidney disease, stage 3 unspecified: Secondary | ICD-10-CM

## 2023-11-04 DIAGNOSIS — G629 Polyneuropathy, unspecified: Secondary | ICD-10-CM | POA: Insufficient documentation

## 2023-11-04 DIAGNOSIS — I350 Nonrheumatic aortic (valve) stenosis: Secondary | ICD-10-CM

## 2023-11-04 DIAGNOSIS — I2489 Other forms of acute ischemic heart disease: Secondary | ICD-10-CM

## 2023-11-04 DIAGNOSIS — K625 Hemorrhage of anus and rectum: Secondary | ICD-10-CM

## 2023-11-04 DIAGNOSIS — R5383 Other fatigue: Secondary | ICD-10-CM | POA: Diagnosis not present

## 2023-11-04 DIAGNOSIS — C8332 Diffuse large B-cell lymphoma, intrathoracic lymph nodes: Secondary | ICD-10-CM | POA: Insufficient documentation

## 2023-11-04 DIAGNOSIS — E611 Iron deficiency: Secondary | ICD-10-CM | POA: Diagnosis not present

## 2023-11-04 DIAGNOSIS — D61818 Other pancytopenia: Secondary | ICD-10-CM

## 2023-11-04 DIAGNOSIS — Q828 Other specified congenital malformations of skin: Secondary | ICD-10-CM

## 2023-11-04 DIAGNOSIS — L03115 Cellulitis of right lower limb: Secondary | ICD-10-CM

## 2023-11-04 DIAGNOSIS — R042 Hemoptysis: Secondary | ICD-10-CM

## 2023-11-04 DIAGNOSIS — I5033 Acute on chronic diastolic (congestive) heart failure: Secondary | ICD-10-CM

## 2023-11-04 DIAGNOSIS — N1832 Chronic kidney disease, stage 3b: Secondary | ICD-10-CM

## 2023-11-04 DIAGNOSIS — E118 Type 2 diabetes mellitus with unspecified complications: Secondary | ICD-10-CM

## 2023-11-04 DIAGNOSIS — H6122 Impacted cerumen, left ear: Secondary | ICD-10-CM

## 2023-11-04 DIAGNOSIS — Z950 Presence of cardiac pacemaker: Secondary | ICD-10-CM

## 2023-11-04 DIAGNOSIS — I129 Hypertensive chronic kidney disease with stage 1 through stage 4 chronic kidney disease, or unspecified chronic kidney disease: Secondary | ICD-10-CM

## 2023-11-04 DIAGNOSIS — E66811 Obesity, class 1: Secondary | ICD-10-CM

## 2023-11-04 DIAGNOSIS — D509 Iron deficiency anemia, unspecified: Secondary | ICD-10-CM | POA: Diagnosis not present

## 2023-11-04 DIAGNOSIS — Z79899 Other long term (current) drug therapy: Secondary | ICD-10-CM | POA: Diagnosis not present

## 2023-11-04 DIAGNOSIS — I4821 Permanent atrial fibrillation: Secondary | ICD-10-CM

## 2023-11-04 DIAGNOSIS — L089 Local infection of the skin and subcutaneous tissue, unspecified: Secondary | ICD-10-CM

## 2023-11-04 DIAGNOSIS — A419 Sepsis, unspecified organism: Secondary | ICD-10-CM

## 2023-11-04 DIAGNOSIS — I214 Non-ST elevation (NSTEMI) myocardial infarction: Secondary | ICD-10-CM

## 2023-11-04 DIAGNOSIS — J410 Simple chronic bronchitis: Secondary | ICD-10-CM

## 2023-11-04 DIAGNOSIS — Z7189 Other specified counseling: Secondary | ICD-10-CM

## 2023-11-04 DIAGNOSIS — R001 Bradycardia, unspecified: Secondary | ICD-10-CM

## 2023-11-04 DIAGNOSIS — R600 Localized edema: Secondary | ICD-10-CM

## 2023-11-04 DIAGNOSIS — R052 Subacute cough: Secondary | ICD-10-CM

## 2023-11-04 DIAGNOSIS — R1012 Left upper quadrant pain: Secondary | ICD-10-CM

## 2023-11-04 DIAGNOSIS — D508 Other iron deficiency anemias: Secondary | ICD-10-CM

## 2023-11-04 DIAGNOSIS — R0602 Shortness of breath: Secondary | ICD-10-CM | POA: Insufficient documentation

## 2023-11-04 DIAGNOSIS — L03119 Cellulitis of unspecified part of limb: Secondary | ICD-10-CM

## 2023-11-04 DIAGNOSIS — I871 Compression of vein: Secondary | ICD-10-CM

## 2023-11-04 DIAGNOSIS — G4733 Obstructive sleep apnea (adult) (pediatric): Secondary | ICD-10-CM

## 2023-11-04 DIAGNOSIS — E119 Type 2 diabetes mellitus without complications: Secondary | ICD-10-CM

## 2023-11-04 DIAGNOSIS — M7989 Other specified soft tissue disorders: Secondary | ICD-10-CM

## 2023-11-04 DIAGNOSIS — J020 Streptococcal pharyngitis: Secondary | ICD-10-CM

## 2023-11-04 DIAGNOSIS — I6529 Occlusion and stenosis of unspecified carotid artery: Secondary | ICD-10-CM

## 2023-11-04 DIAGNOSIS — D518 Other vitamin B12 deficiency anemias: Secondary | ICD-10-CM

## 2023-11-04 DIAGNOSIS — I10 Essential (primary) hypertension: Secondary | ICD-10-CM

## 2023-11-04 DIAGNOSIS — Z96652 Presence of left artificial knee joint: Secondary | ICD-10-CM

## 2023-11-04 DIAGNOSIS — G6289 Other specified polyneuropathies: Secondary | ICD-10-CM

## 2023-11-04 DIAGNOSIS — I442 Atrioventricular block, complete: Secondary | ICD-10-CM

## 2023-11-04 DIAGNOSIS — D692 Other nonthrombocytopenic purpura: Secondary | ICD-10-CM

## 2023-11-04 DIAGNOSIS — J324 Chronic pansinusitis: Secondary | ICD-10-CM

## 2023-11-04 DIAGNOSIS — Z953 Presence of xenogenic heart valve: Secondary | ICD-10-CM

## 2023-11-04 DIAGNOSIS — I480 Paroxysmal atrial fibrillation: Secondary | ICD-10-CM

## 2023-11-04 DIAGNOSIS — E1169 Type 2 diabetes mellitus with other specified complication: Secondary | ICD-10-CM

## 2023-11-04 DIAGNOSIS — E1165 Type 2 diabetes mellitus with hyperglycemia: Secondary | ICD-10-CM

## 2023-11-04 DIAGNOSIS — I441 Atrioventricular block, second degree: Secondary | ICD-10-CM

## 2023-11-04 DIAGNOSIS — B351 Tinea unguium: Secondary | ICD-10-CM

## 2023-11-04 DIAGNOSIS — E1142 Type 2 diabetes mellitus with diabetic polyneuropathy: Secondary | ICD-10-CM

## 2023-11-04 LAB — FERRITIN: Ferritin: 2063 ng/mL — ABNORMAL HIGH (ref 24–336)

## 2023-11-04 LAB — CBC WITH DIFFERENTIAL (CANCER CENTER ONLY)
Abs Immature Granulocytes: 0.06 10*3/uL (ref 0.00–0.07)
Basophils Absolute: 0.1 10*3/uL (ref 0.0–0.1)
Basophils Relative: 1 %
Eosinophils Absolute: 0.2 10*3/uL (ref 0.0–0.5)
Eosinophils Relative: 3 %
HCT: 42.6 % (ref 39.0–52.0)
Hemoglobin: 13.8 g/dL (ref 13.0–17.0)
Immature Granulocytes: 1 %
Lymphocytes Relative: 24 %
Lymphs Abs: 1.6 10*3/uL (ref 0.7–4.0)
MCH: 30.1 pg (ref 26.0–34.0)
MCHC: 32.4 g/dL (ref 30.0–36.0)
MCV: 93 fL (ref 80.0–100.0)
Monocytes Absolute: 0.4 10*3/uL (ref 0.1–1.0)
Monocytes Relative: 6 %
Neutro Abs: 4.6 10*3/uL (ref 1.7–7.7)
Neutrophils Relative %: 65 %
Platelet Count: 196 10*3/uL (ref 150–400)
RBC: 4.58 MIL/uL (ref 4.22–5.81)
RDW: 16.1 % — ABNORMAL HIGH (ref 11.5–15.5)
WBC Count: 6.9 10*3/uL (ref 4.0–10.5)
nRBC: 0 % (ref 0.0–0.2)

## 2023-11-04 LAB — CMP (CANCER CENTER ONLY)
ALT: 16 U/L (ref 0–44)
AST: 20 U/L (ref 15–41)
Albumin: 4.6 g/dL (ref 3.5–5.0)
Alkaline Phosphatase: 66 U/L (ref 38–126)
Anion gap: 8 (ref 5–15)
BUN: 37 mg/dL — ABNORMAL HIGH (ref 8–23)
CO2: 31 mmol/L (ref 22–32)
Calcium: 9.7 mg/dL (ref 8.9–10.3)
Chloride: 100 mmol/L (ref 98–111)
Creatinine: 1.93 mg/dL — ABNORMAL HIGH (ref 0.61–1.24)
GFR, Estimated: 34 mL/min — ABNORMAL LOW (ref >60)
Glucose, Bld: 247 mg/dL — ABNORMAL HIGH (ref 70–99)
Potassium: 4.3 mmol/L (ref 3.5–5.1)
Sodium: 139 mmol/L (ref 135–145)
Total Bilirubin: 0.6 mg/dL (ref 0.0–1.2)
Total Protein: 7.2 g/dL (ref 6.5–8.1)

## 2023-11-04 LAB — VITAMIN B12: Vitamin B-12: 281 pg/mL (ref 180–914)

## 2023-11-04 LAB — IRON AND IRON BINDING CAPACITY (CC-WL,HP ONLY)
Iron: 96 ug/dL (ref 45–182)
Saturation Ratios: 28 % (ref 17.9–39.5)
TIBC: 343 ug/dL (ref 250–450)
UIBC: 247 ug/dL (ref 117–376)

## 2023-11-04 MED ORDER — DENOSUMAB 120 MG/1.7ML ~~LOC~~ SOLN
120.0000 mg | Freq: Once | SUBCUTANEOUS | Status: AC
  Administered 2023-11-04: 120 mg via SUBCUTANEOUS
  Filled 2023-11-04: qty 1.7

## 2023-11-04 MED ORDER — CYANOCOBALAMIN 1000 MCG/ML IJ SOLN
1000.0000 ug | Freq: Once | INTRAMUSCULAR | Status: AC
  Administered 2023-11-04: 1000 ug via INTRAMUSCULAR
  Filled 2023-11-04: qty 1

## 2023-11-04 NOTE — Patient Instructions (Addendum)
Vitamin B12 Deficiency Vitamin B12 deficiency occurs when the body does not have enough of this important vitamin. The body needs this vitamin: To make red blood cells. To make DNA. This is the genetic material inside cells. To help the nerves work properly so they can carry messages from the brain to the body. Vitamin B12 deficiency can cause health problems, such as not having enough red blood cells in the blood (anemia). This can lead to nerve damage if untreated. What are the causes? This condition may be caused by: Not eating enough foods that contain vitamin B12. Not having enough stomach acid and digestive fluids to properly absorb vitamin B12 from the food that you eat. Having certain diseases that make it hard to absorb vitamin B12. These diseases include Crohn's disease, chronic pancreatitis, and cystic fibrosis. An autoimmune disorder in which the body does not make enough of a protein (intrinsic factor) within the stomach, resulting in not enough absorption of vitamin B12. Having a surgery in which part of the stomach or small intestine is removed. Taking certain medicines that make it hard for the body to absorb vitamin B12. These include: Heartburn medicines, such as antacids and proton pump inhibitors. Some medicines that are used to treat diabetes. What increases the risk? The following factors may make you more likely to develop a vitamin B12 deficiency: Being an older adult. Eating a vegetarian or vegan diet that does not include any foods that come from animals. Eating a poor diet while you are pregnant. Taking certain medicines. Having alcoholism. What are the signs or symptoms? In some cases, there are no symptoms of this condition. If the condition leads to anemia or nerve damage, various symptoms may occur, such as: Weakness. Tiredness (fatigue). Loss of appetite. Numbness or tingling in your hands and feet. Redness and burning of the tongue. Depression,  confusion, or memory problems. Trouble walking. If anemia is severe, symptoms can include: Shortness of breath. Dizziness. Rapid heart rate. How is this diagnosed? This condition may be diagnosed with a blood test to measure the level of vitamin B12 in your blood. You may also have other tests, including: A group of tests that measure certain characteristics of blood cells (complete blood count, CBC). A blood test to measure intrinsic factor. A procedure where a thin tube with a camera on the end is used to look into your stomach or intestines (endoscopy). Other tests may be needed to discover the cause of the deficiency. How is this treated? Treatment for this condition depends on the cause. This condition may be treated by: Changing your eating and drinking habits, such as: Eating more foods that contain vitamin B12. Drinking less alcohol or no alcohol. Getting vitamin B12 injections. Taking vitamin B12 supplements by mouth (orally). Your health care provider will tell you which dose is best for you. Follow these instructions at home: Eating and drinking  Include foods in your diet that come from animals and contain a lot of vitamin B12. These include: Meats and poultry. This includes beef, pork, chicken, [place], and organ meats, such as liver. Seafood. This includes clams, rainbow trout, salmon, tuna, and haddock. Eggs. Dairy foods such as milk, yogurt, and cheese. Eat foods that have vitamin B12 added to them (are fortified), such as ready-to-eat breakfast cereals. Check the label on the package to see if a food is fortified. The items listed above may not be a complete list of foods and beverages you can eat and drink. Contact a dietitian for  more information. Alcohol use Do not drink alcohol if: Your health care provider tells you not to drink. You are pregnant, may be pregnant, or are planning to become pregnant. If you drink alcohol: Limit how much you have to: 0-1 drink a  day for women. 0-2 drinks a day for men. Know how much alcohol is in your drink. In the [place] one drink equals one 12 oz bottle of beer (355 mL), one 5 oz glass of wine (148 mL), or one 1 oz glass of hard liquor (44 mL). General instructions Get vitamin B12 injections if told to by your health care provider. Take supplements only as told by your health care provider. Follow the directions carefully. Keep all follow-up visits. This is important. Contact a health care provider if: Your symptoms come back. Your symptoms get worse or do not improve with treatment. Get help right away: You develop shortness of breath. You have a rapid heart rate. You have chest pain. You become dizzy or you faint. These symptoms may be an emergency. Get help right away. Call 911. Do not wait to see if the symptoms will go away. Do not drive yourself to the hospital. Summary Vitamin B12 deficiency occurs when the body does not have enough of this important vitamin. Common causes include not eating enough foods that contain vitamin B12, not being able to absorb vitamin B12 from the food that you eat, having a surgery in which part of the stomach or small intestine is removed, or taking certain medicines. Eat foods that have vitamin B12 in them. Treatment may include making a change in the way you eat and drink, getting vitamin B12 injections, or taking vitamin B12 supplements. This information is not intended to replace advice given to you by your health care provider. Make sure you discuss any questions you have with your health care provider. Document Revised: 02/09/2021 Document Reviewed: 02/09/2021 Elsevier Patient Education  2024 Else[employer] Inc. Denosumab Injection (Oncology) What is this medication? DENOSUMAB (den oh SUE mab) prevents weakened bones caused by cancer. It may also be used to treat noncancerous bone tumors that cannot be removed by surgery. It can also be used to treat high calcium levels in  the blood caused by cancer. It works by blocking a protein that causes bones to break down quickly. This slows down the release of calcium from bones, which lowers calcium levels in your blood. It also makes your bones stronger and less likely to break (fracture). This medicine may be used for other purposes; ask your health care provider or pharmacist if you have questions. COMMON BRAND NAME(S): XGEVA What should I tell my care team before I take this medication? They need to know if you have any of these conditions: Dental disease Having surgery or tooth extraction Infection Kidney disease Low levels of calcium or vitamin D in the blood Malnutrition On hemodialysis Skin conditions or sensitivity Thyroid or parathyroid disease An unusual reaction to denosumab, other medications, foods, dyes, or preservatives Pregnant or trying to get pregnant Breast-feeding How should I use this medication? This medication is for injection under the skin. It is given by your care team in a hospital or clinic setting. A special MedGuide will be given to you before each treatment. Be sure to read this information carefully each time. Talk to your care team about the use of this medication in children. While it may be prescribed for children as young as 13 years for selected conditions, precautions do apply. Overdosage: If  you think you have taken too much of this medicine contact a poison control center or emergency room at once. NOTE: This medicine is only for you. Do not share this medicine with others. What if I miss a dose? Keep appointments for follow-up doses. It is important not to miss your dose. Call your care team if you are unable to keep an appointment. What may interact with this medication? Do not take this medication with any of the following: Other medications containing denosumab This medication may also interact with the following: Medications that lower your chance of fighting  infection Steroid medications, such as prednisone or cortisone This list may not describe all possible interactions. Give your health care provider a list of all the medicines, herbs, non-prescription drugs, or dietary supplements you use. Also tell them if you smoke, drink alcohol, or use illegal drugs. Some items may interact with your medicine. What should I watch for while using this medication? Your condition will be monitored carefully while you are receiving this medication. You may need blood work while taking this medication. This medication may increase your risk of getting an infection. Call your care team for advice if you get a fever, chills, sore throat, or other symptoms of a cold or flu. Do not treat yourself. Try to avoid being around people who are sick. You should make sure you get enough calcium and vitamin D while you are taking this medication, unless your care team tells you not to. Discuss the foods you eat and the vitamins you take with your care team. Some people who take this medication have severe bone, joint, or muscle pain. This medication may also increase your risk for jaw problems or a broken thigh bone. Tell your care team right away if you have severe pain in your jaw, bones, joints, or muscles. Tell your care team if you have any pain that does not go away or that gets worse. Talk to your care team if you may be pregnant. Serious birth defects can occur if you take this medication during pregnancy and for 5 months after the last dose. You will need a negative pregnancy test before starting this medication. Contraception is recommended while taking this medication and for 5 months after the last dose. Your care team can help you find the option that works for you. What side effects may I notice from receiving this medication? Side effects that you should report to your care team as soon as possible: Allergic reactions--skin rash, itching, hives, swelling of the face,  lips, tongue, or throat Bone, joint, or muscle pain Low calcium level--muscle pain or cramps, confusion, tingling, or numbness in the hands or feet Osteonecrosis of the jaw--pain, swelling, or redness in the mouth, numbness of the jaw, poor healing after dental work, unusual discharge from the mouth, visible bones in the mouth Side effects that usually do not require medical attention (report to your care team if they continue or are bothersome): Cough Diarrhea Fatigue Headache Nausea This list may not describe all possible side effects. Call your doctor for medical advice about side effects. You may report side effects to FDA at 1-800-FDA-1088. Where should I keep my medication? This medication is given in a hospital or clinic. It will not be stored at home. NOTE: This sheet is a summary. It may not cover all possible information. If you have questions about this medicine, talk to your doctor, pharmacist, or health care provider.  2024 Elsevier/Gold Standard (2021-11-07 00:00:00)

## 2023-11-04 NOTE — Progress Notes (Signed)
 Hematology and Oncology Follow Up Visit  Richard [name] [id number] 11/03/1940 83 y.o. 11/04/2023   Principle Diagnosis:  Diffuse large cell non-Hodgkin's lymphoma (IPI = 3) - NOT "double hit" Pernicious anemia Iron deficiency secondary to bleeding   Past Therapy: R-CHOP - s/p cycle 8 - completed 08/2017   Current Therapy:        Vitamin B12 1 mg IM every month Xgeva  120 mg subcu q 3 months - next dose due in 10/2023 IV iron as indicated   Interim History:  Richard [name] is here today for follow-up and B 12 injection. He is doing well and has no complaints at this time.  He is golfing several days a week.  Mild fatigue and SOB with over exertion. Not often.  No fever, chills, n/v, cough, rash, dizziness, chest pain, palpitations, abdominal pain or changes in bowel or bladder habits.  No falls or syncope reported.  Neuropathy in lower extremities unchanged from baseline.  Appetite and hydration are good. Weight is stable at 240 lbs.   ECOG Performance Status: 1 - Symptomatic but completely ambulatory  Medications:  Allergies as of 11/04/2023       Reactions   Ace Inhibitors Swelling, Other (See Comments)   Angioedema   Benazepril Swelling, Other (See Comments)   Angioedema; he is not a candidate for any angiotensin receptor blockers because of this significant allergic reaction. Because of a history of documented adverse serious drug reaction;Medi Alert bracelet  is recommended   Entresto  [sacubitril -valsartan ] Swelling, Other (See Comments)   First-in-Class Angiotensin Receptor Neprilysin Inhibitor- Med was "red-flagged" by the patient's pharmacy for him to NOT take!   Hctz [hydrochlorothiazide] Anaphylaxis, Swelling   Tongue and lip swelling   Aspirin  Other (See Comments)   Gastritis, can aspirin  not take 325 mg aspirin         Medication List        Accurate as of Nov 04, 2023 10:24 AM. If you have any questions, ask your nurse or doctor.          acetaminophen  325  MG tablet Commonly known as: TYLENOL  Take 650 mg by mouth at bedtime as needed for moderate pain (pain score 4-6) or headache.   allopurinol  300 MG tablet Commonly known as: ZYLOPRIM  Take 450 mg by mouth daily.   aluminum hydroxide-magnesium  carbonate 95-358 MG/15ML Susp Commonly known as: GAVISCON Take 15 mLs by mouth as needed for indigestion or heartburn.   amoxicillin  500 MG capsule Commonly known as: AMOXIL  Take 500 mg by mouth 3 (three) times daily.   atorvastatin  80 MG tablet Commonly known as: LIPITOR  TAKE 1 TABLET BY MOUTH EVERYDAY AT BEDTIME   Azelastine  HCl 137 MCG/SPRAY Soln PLACE 2 SPRAYS INTO BOTH NOSTRILS AT BEDTIME AS NEEDED FOR RHINITIS OR ALLERGIES.   CO Q-10 PO Take 100 mg by mouth daily.   Eliquis  2.5 MG Tabs tablet Generic drug: apixaban  TAKE 1 TABLET BY MOUTH TWICE A DAY   esomeprazole  40 MG capsule Commonly known as: NexIUM  Take 1 capsule (40 mg total) by mouth daily.   ezetimibe  10 MG tablet Commonly known as: ZETIA  Take 1 tablet (10 mg total) by mouth daily.   Farxiga  10 MG Tabs tablet Generic drug: dapagliflozin  propanediol Take 10 mg by mouth in the morning.   fenofibrate  160 MG tablet TAKE 1 TABLET BY MOUTH EVERY DAY   folic acid  1 MG tablet Commonly known as: FOLVITE  TAKE 2 TABLETS BY MOUTH EVERY DAY   freestyle lancets USE ONCE A DAY  TO CHECK BLOOD SUGAR.   FREESTYLE LITE test strip Generic drug: glucose blood USE TO TEST BLOOD SUGAR ONCE A DAY. DX CODE: E11.9   gabapentin  100 MG capsule Commonly known as: NEURONTIN  Take 100 mg by mouth at bedtime. PRN   glimepiride  2 MG tablet Commonly known as: AMARYL  TAKE 1 TABLET (2 MG TOTAL) BY MOUTH IN THE MORNING AND AT BEDTIME.   ICY HOT ADVANCED PAIN RELIEF EX Apply 1 application  topically daily as needed (to painful sites).   Klor-Con  M20 20 MEQ tablet Generic drug: potassium chloride  SA TAKE 2 TABLETS BY MOUTH EVERY DAY   metoprolol  succinate 25 MG 24 hr tablet Commonly  known as: TOPROL -XL TAKE 1 TABLET (25 MG TOTAL) BY MOUTH DAILY.   nitroGLYCERIN  0.4 MG SL tablet Commonly known as: NITROSTAT  PLACE 1 TABLET UNDER THE TONGUE EVERY 5 MINUTES AS NEEDED FOR CHEST PAIN.   pregabalin  150 MG capsule Commonly known as: LYRICA  TAKE 1 CAPSULE (150 MG TOTAL) BY MOUTH AT BEDTIME. (TAKE 100MG  DAILY AT NOON)   pregabalin  100 MG capsule Commonly known as: LYRICA  TAKE 1 CAPSULE (100 MG TOTAL) BY MOUTH DAILY. AT NOON   psyllium 58.6 % powder Commonly known as: METAMUCIL Take 1 packet by mouth daily as needed (for constipation- mix and drink).   torsemide  20 MG tablet Commonly known as: DEMADEX  TAKE 2 TAB BY MOUTH IN MORNING TAKE EXTRA TABLET FOR WEIGHT GAIN MORE THAN 2-3LBS IN 24HR FOR 90DAY   Trulicity  3 MG/0.5ML Soaj Generic drug: Dulaglutide  INJECT 3 MG INTO THE SKIN ONE TIME PER WEEK   Vascepa  1 g capsule Generic drug: icosapent  Ethyl TAKE 2 CAPSULES BY MOUTH 2 TIMES DAILY.   VITAMIN C  PO Take 1 tablet by mouth daily with breakfast.        Allergies:  Allergies  Allergen Reactions   Ace Inhibitors Swelling and Other (See Comments)    Angioedema    Benazepril Swelling and Other (See Comments)    Angioedema; he is not a candidate for any angiotensin receptor blockers because of this significant allergic reaction. Because of a history of documented adverse serious drug reaction;Medi Alert bracelet  is recommended   Entresto  [Sacubitril -Valsartan ] Swelling and Other (See Comments)    First-in-Class Angiotensin Receptor Neprilysin Inhibitor- Med was "red-flagged" by the patient's pharmacy for him to NOT take!   Hctz [Hydrochlorothiazide] Anaphylaxis and Swelling    Tongue and lip swelling    Aspirin  Other (See Comments)    Gastritis, can aspirin  not take 325 mg aspirin     Past Medical History, Surgical history, Social history, and Family History were reviewed and updated.  Review of Systems: All other 10 point review of systems is negative.    Physical Exam:  vitals were not taken for this visit.   Wt Readings from Last 3 Encounters:  10/07/23 239 lb 1.3 oz (108.4 kg)  09/30/23 236 lb (107 kg)  09/11/23 236 lb (107 kg)    Ocular: Sclerae unicteric, pupils equal, round and reactive to light Ear-nose-throat: Oropharynx clear, dentition fair Lymphatic: No cervical or supraclavicular adenopathy Lungs no rales or rhonchi, good excursion bilaterally Heart regular rate and rhythm, no murmur appreciated Abd soft, nontender, positive bowel sounds MSK no focal spinal tenderness, no joint edema Neuro: non-focal, well-oriented, appropriate affect Breasts: Deferred   Lab Results  Component Value Date   WBC 6.9 11/04/2023   HGB 13.8 11/04/2023   HCT 42.6 11/04/2023   MCV 93.0 11/04/2023   PLT 196 11/04/2023  Lab Results  Component Value Date   FERRITIN 1,384 (H) 10/07/2023   IRON 81 10/07/2023   TIBC 388 10/07/2023   UIBC 307 10/07/2023   IRONPCTSAT 21 10/07/2023   Lab Results  Component Value Date   RETICCTPCT 1.7 04/07/2023   RBC 4.58 11/04/2023   RETICCTABS 52.1 11/22/2011   No results found for: "KPAFRELGTCHN", "LAMBDASER", "[facility]" Lab Results  Component Value Date   IGA 159 03/09/2012   Lab Results  Component Value Date   ALBUMINELP 4.2 07/27/2018   MSPIKE Not Observed 07/27/2018     Chemistry      Component Value Date/Time   NA 141 10/07/2023 0909   NA 138 02/08/2022 0811   NA 144 06/27/2017 0857   K 4.5 10/07/2023 0909   K 3.9 06/27/2017 0857   CL 99 10/07/2023 0909   CL 103 06/27/2017 0857   CO2 33 (H) 10/07/2023 0909   CO2 26 06/27/2017 0857   BUN 33 (H) 10/07/2023 0909   BUN 46 (H) 02/08/2022 0811   BUN 12 06/27/2017 0857   CREATININE 1.76 (H) 10/07/2023 0909   CREATININE 1.65 (H) 04/18/2020 0956      Component Value Date/Time   CALCIUM  10.0 10/07/2023 0909   CALCIUM  9.2 06/27/2017 0857   ALKPHOS 65 10/07/2023 0909   ALKPHOS 128 (H) 06/27/2017 0857   AST 20 10/07/2023 0909    ALT 17 10/07/2023 0909   ALT 24 06/27/2017 0857   BILITOT 0.6 10/07/2023 0909       Impression and Plan: Richard [name] is a very pleasant 83 yo caucasian gentleman with diffuse large B-cell lymphoma (not "double hit" lymphoma). He completed treatment in February 2019.  He is now on Xgeva  every 3 months along with PRN IV iron and IM B12.  We will proceed with B 12 injection.  Iron studies pending. We will replace if needed.  CBC and CMP routed to Dr. Garland Junk with nephrology and Brendon Caller, PA-C with ID.  Follow-up in 1 month.   Kennard Pea, NP 5/6/202510:24 AM

## 2023-11-05 ENCOUNTER — Other Ambulatory Visit: Payer: Self-pay | Admitting: Hematology & Oncology

## 2023-11-05 ENCOUNTER — Other Ambulatory Visit: Payer: Self-pay | Admitting: Cardiology

## 2023-11-06 DIAGNOSIS — N1832 Chronic kidney disease, stage 3b: Secondary | ICD-10-CM | POA: Diagnosis not present

## 2023-11-06 DIAGNOSIS — D631 Anemia in chronic kidney disease: Secondary | ICD-10-CM | POA: Diagnosis not present

## 2023-11-06 DIAGNOSIS — I129 Hypertensive chronic kidney disease with stage 1 through stage 4 chronic kidney disease, or unspecified chronic kidney disease: Secondary | ICD-10-CM | POA: Diagnosis not present

## 2023-11-06 DIAGNOSIS — E1122 Type 2 diabetes mellitus with diabetic chronic kidney disease: Secondary | ICD-10-CM | POA: Diagnosis not present

## 2023-11-06 DIAGNOSIS — I5020 Unspecified systolic (congestive) heart failure: Secondary | ICD-10-CM | POA: Diagnosis not present

## 2023-11-06 DIAGNOSIS — C8330 Diffuse large B-cell lymphoma, unspecified site: Secondary | ICD-10-CM | POA: Diagnosis not present

## 2023-11-06 DIAGNOSIS — N2581 Secondary hyperparathyroidism of renal origin: Secondary | ICD-10-CM | POA: Diagnosis not present

## 2023-11-07 LAB — LAB REPORT - SCANNED
Albumin, Urine POC: <3
Albumin/Creatinine Ratio, Urine, POC: <16
Creatinine, POC: 18.4 mg/dL

## 2023-11-17 ENCOUNTER — Telehealth (HOSPITAL_BASED_OUTPATIENT_CLINIC_OR_DEPARTMENT_OTHER): Payer: Self-pay

## 2023-11-17 NOTE — Telephone Encounter (Signed)
 Scheduled to see Dr Villa Greaser on 7/14. Nothing further needed.

## 2023-11-17 NOTE — Telephone Encounter (Signed)
 Copied from CRM [id number]. Topic: Clinical - Order For Equipment >> Nov 17, 2023  3:27 PM Isabell A wrote: Reason for CRM: Patient states he was qualified for a new CPAP machine from [facility] that he never received but now they're requesting a new order.  Requesting a call back with an update.

## 2023-11-22 ENCOUNTER — Other Ambulatory Visit: Payer: Self-pay | Admitting: Family Medicine

## 2023-12-02 ENCOUNTER — Inpatient Hospital Stay

## 2023-12-02 ENCOUNTER — Inpatient Hospital Stay: Admitting: Family

## 2023-12-04 ENCOUNTER — Other Ambulatory Visit: Payer: Self-pay | Admitting: Cardiology

## 2023-12-09 ENCOUNTER — Inpatient Hospital Stay

## 2023-12-09 ENCOUNTER — Inpatient Hospital Stay: Attending: Hematology & Oncology

## 2023-12-09 ENCOUNTER — Inpatient Hospital Stay (HOSPITAL_BASED_OUTPATIENT_CLINIC_OR_DEPARTMENT_OTHER): Admitting: Family

## 2023-12-09 ENCOUNTER — Encounter: Payer: Self-pay | Admitting: Family

## 2023-12-09 VITALS — BP 120/56 | HR 84 | Temp 97.6°F | Resp 17 | Wt 237.0 lb

## 2023-12-09 DIAGNOSIS — D510 Vitamin B12 deficiency anemia due to intrinsic factor deficiency: Secondary | ICD-10-CM | POA: Diagnosis not present

## 2023-12-09 DIAGNOSIS — D509 Iron deficiency anemia, unspecified: Secondary | ICD-10-CM

## 2023-12-09 DIAGNOSIS — D519 Vitamin B12 deficiency anemia, unspecified: Secondary | ICD-10-CM

## 2023-12-09 DIAGNOSIS — N1832 Chronic kidney disease, stage 3b: Secondary | ICD-10-CM | POA: Diagnosis not present

## 2023-12-09 DIAGNOSIS — C8332 Diffuse large B-cell lymphoma, intrathoracic lymph nodes: Secondary | ICD-10-CM | POA: Insufficient documentation

## 2023-12-09 DIAGNOSIS — Z950 Presence of cardiac pacemaker: Secondary | ICD-10-CM | POA: Diagnosis not present

## 2023-12-09 DIAGNOSIS — E611 Iron deficiency: Secondary | ICD-10-CM | POA: Diagnosis not present

## 2023-12-09 DIAGNOSIS — Z79899 Other long term (current) drug therapy: Secondary | ICD-10-CM | POA: Diagnosis not present

## 2023-12-09 LAB — CBC WITH DIFFERENTIAL (CANCER CENTER ONLY)
Abs Immature Granulocytes: 0.03 10*3/uL (ref 0.00–0.07)
Basophils Absolute: 0 10*3/uL (ref 0.0–0.1)
Basophils Relative: 1 %
Eosinophils Absolute: 0.4 10*3/uL (ref 0.0–0.5)
Eosinophils Relative: 7 %
HCT: 42.6 % (ref 39.0–52.0)
Hemoglobin: 13.8 g/dL (ref 13.0–17.0)
Immature Granulocytes: 1 %
Lymphocytes Relative: 22 %
Lymphs Abs: 1.4 10*3/uL (ref 0.7–4.0)
MCH: 29.2 pg (ref 26.0–34.0)
MCHC: 32.4 g/dL (ref 30.0–36.0)
MCV: 90.3 fL (ref 80.0–100.0)
Monocytes Absolute: 0.4 10*3/uL (ref 0.1–1.0)
Monocytes Relative: 6 %
Neutro Abs: 4 10*3/uL (ref 1.7–7.7)
Neutrophils Relative %: 63 %
Platelet Count: 186 10*3/uL (ref 150–400)
RBC: 4.72 MIL/uL (ref 4.22–5.81)
RDW: 15.7 % — ABNORMAL HIGH (ref 11.5–15.5)
WBC Count: 6.4 10*3/uL (ref 4.0–10.5)
nRBC: 0 % (ref 0.0–0.2)

## 2023-12-09 LAB — IRON AND IRON BINDING CAPACITY (CC-WL,HP ONLY)
Iron: 90 ug/dL (ref 45–182)
Saturation Ratios: 26 % (ref 17.9–39.5)
TIBC: 347 ug/dL (ref 250–450)
UIBC: 257 ug/dL (ref 117–376)

## 2023-12-09 LAB — CMP (CANCER CENTER ONLY)
ALT: 13 U/L (ref 0–44)
AST: 18 U/L (ref 15–41)
Albumin: 4.4 g/dL (ref 3.5–5.0)
Alkaline Phosphatase: 71 U/L (ref 38–126)
Anion gap: 10 (ref 5–15)
BUN: 30 mg/dL — ABNORMAL HIGH (ref 8–23)
CO2: 27 mmol/L (ref 22–32)
Calcium: 9.3 mg/dL (ref 8.9–10.3)
Chloride: 101 mmol/L (ref 98–111)
Creatinine: 1.78 mg/dL — ABNORMAL HIGH (ref 0.61–1.24)
GFR, Estimated: 37 mL/min — ABNORMAL LOW (ref >60)
Glucose, Bld: 250 mg/dL — ABNORMAL HIGH (ref 70–99)
Potassium: 4.1 mmol/L (ref 3.5–5.1)
Sodium: 138 mmol/L (ref 135–145)
Total Bilirubin: 0.6 mg/dL (ref 0.0–1.2)
Total Protein: 7.1 g/dL (ref 6.5–8.1)

## 2023-12-09 LAB — FERRITIN: Ferritin: 2329 ng/mL — ABNORMAL HIGH (ref 24–336)

## 2023-12-09 LAB — VITAMIN B12: Vitamin B-12: 266 pg/mL (ref 180–914)

## 2023-12-09 MED ORDER — CYANOCOBALAMIN 1000 MCG/ML IJ SOLN
1000.0000 ug | Freq: Once | INTRAMUSCULAR | Status: AC
  Administered 2023-12-09: 1000 ug via INTRAMUSCULAR
  Filled 2023-12-09: qty 1

## 2023-12-09 NOTE — Patient Instructions (Signed)
 Vitamin B12 Deficiency Vitamin B12 deficiency means that your body does not have enough vitamin B12. The body needs this important vitamin: To make red blood cells. To make genes (DNA). To help the nerves work. If you do not have enough vitamin B12 in your body, you can have health problems, such as not having enough red blood cells in the blood (anemia). What are the causes? Not eating enough foods that contain vitamin B12. Not being able to take in (absorb) vitamin B12 from the food that you eat. Certain diseases. A condition in which the body does not make enough of a certain protein. This results in your body not taking in enough vitamin B12. Having a surgery in which part of the stomach or small intestine is taken out. Taking medicines that make it hard for the body to take in vitamin B12. These include: Heartburn medicines. Some medicines that are used to treat diabetes. What increases the risk? Being an older adult. Eating a vegetarian or vegan diet that does not include any foods that come from animals. Not eating enough foods that contain vitamin B12 while you are pregnant. Taking certain medicines. Having alcoholism. What are the signs or symptoms? In some cases, there are no symptoms. If the condition leads to too few blood cells or nerve damage, symptoms can occur, such as: Feeling weak or tired. Not being hungry. Losing feeling (numbness) or tingling in your hands and feet. Redness and burning of the tongue. Feeling sad (depressed). Confusion or memory problems. Trouble walking. If anemia is very bad, symptoms can include: Being short of breath. Being dizzy. Having a very fast heartbeat. How is this treated? Changing the way you eat and drink, such as: Eating more foods that contain vitamin B12. Drinking little or no alcohol. Getting vitamin B12 shots. Taking vitamin B12 supplements by mouth (orally). Your doctor will tell you the dose that is best for you. Follow  these instructions at home: Eating and drinking  Eat foods that come from animals and have a lot of vitamin B12 in them. These include: Meats and poultry. This includes beef, pork, chicken, [place], and organ meats, such as liver. Seafood, such as clams, rainbow trout, salmon, tuna, and haddock. Eggs. Dairy foods such as milk, yogurt, and cheese. Eat breakfast cereals that have vitamin B12 added to them (are fortified). Check the label. The items listed above may not be a complete list of foods and beverages you can eat and drink. Contact a dietitian for more information. Alcohol use Do not drink alcohol if: Your doctor tells you not to drink. You are pregnant, may be pregnant, or are planning to become pregnant. If you drink alcohol: Limit how much you have to: 0-1 drink a day for women. 0-2 drinks a day for men. Know how much alcohol is in your drink. In the [place] one drink equals one 12 oz bottle of beer (355 mL), one 5 oz glass of wine (148 mL), or one 1 oz glass of hard liquor (44 mL). General instructions Get any vitamin B12 shots if told by your doctor. Take supplements only as told by your doctor. Follow the directions. Keep all follow-up visits. Contact a doctor if: Your symptoms come back. Your symptoms get worse or do not get better with treatment. Get help right away if: You have trouble breathing. You have a very fast heartbeat. You have chest pain. You get dizzy. You faint. These symptoms may be an emergency. Get help right away. Call 911.  Do not wait to see if the symptoms will go away. Do not drive yourself to the hospital. Summary Vitamin B12 deficiency means that your body is not getting enough of the vitamin. In some cases, there are no symptoms of this condition. Treatment may include making a change in the way you eat and drink, getting shots, or taking supplements. Eat foods that have vitamin B12 in them. This information is not intended to replace advice  given to you by your health care provider. Make sure you discuss any questions you have with your health care provider. Document Revised: 02/09/2021 Document Reviewed: 02/09/2021 Elsevier Patient Education  2024 [employer].

## 2023-12-09 NOTE — Progress Notes (Signed)
 Hematology and Oncology Follow Up Visit  Richard [name] [id number] October 22, 1940 83 y.o. 12/09/2023   Principle Diagnosis:  Diffuse large cell non-Hodgkin's lymphoma (IPI = 3) - NOT "double hit" Pernicious anemia Iron deficiency secondary to bleeding   Past Therapy: R-CHOP - s/p cycle 8 - completed 08/2017   Current Therapy:        Vitamin B12 1 mg IM every month Xgeva  120 mg subcu q 3 months - next dose due in 01/2024 IV iron as indicated   Interim History:  Richard [name] is here today for follow-up and B 12 injection. He is doing quite well and has no complaints at this time.  Energy has been good and he is enjoying golfing with his buddies. Unfortunately one of his close friends passed away yesterday.  No issues with blood loss, no abnormal bruising, no petechiae.  No fever, chills, n/v, cough, rash, dizziness, SOB, chest pain, palpitations, abdominal pain or changes in bowel or bladder habits at this time.  He has been taking metamucil to prevent constipation.  Mild SOB with over exertion has remained stable.  No swelling in his extremities noted at this time.  No falls or syncope reported.  Appetite and hydration are good. Weight is stable at 237 lbs.   ECOG Performance Status: 1 - Symptomatic but completely ambulatory  Medications:  Allergies as of 12/09/2023       Reactions   Ace Inhibitors Swelling, Other (See Comments)   Angioedema   Benazepril Swelling, Other (See Comments)   Angioedema; he is not a candidate for any angiotensin receptor blockers because of this significant allergic reaction. Because of a history of documented adverse serious drug reaction;Medi Alert bracelet  is recommended   Entresto  [sacubitril -valsartan ] Swelling, Other (See Comments)   First-in-Class Angiotensin Receptor Neprilysin Inhibitor- Med was "red-flagged" by the patient's pharmacy for him to NOT take!   Hctz [hydrochlorothiazide] Anaphylaxis, Swelling   Tongue and lip swelling   Aspirin   Other (See Comments)   Gastritis, can aspirin  not take 325 mg aspirin         Medication List        Accurate as of December 09, 2023  9:58 AM. If you have any questions, ask your nurse or doctor.          acetaminophen  325 MG tablet Commonly known as: TYLENOL  Take 650 mg by mouth at bedtime as needed for moderate pain (pain score 4-6) or headache.   allopurinol  300 MG tablet Commonly known as: ZYLOPRIM  Take 450 mg by mouth daily.   aluminum hydroxide-magnesium  carbonate 95-358 MG/15ML Susp Commonly known as: GAVISCON Take 15 mLs by mouth as needed for indigestion or heartburn.   amoxicillin  500 MG capsule Commonly known as: AMOXIL  Take 500 mg by mouth 2 (two) times daily.   atorvastatin  80 MG tablet Commonly known as: LIPITOR  TAKE 1 TABLET BY MOUTH EVERYDAY AT BEDTIME   Azelastine  HCl 137 MCG/SPRAY Soln PLACE 2 SPRAYS INTO BOTH NOSTRILS AT BEDTIME AS NEEDED FOR RHINITIS OR ALLERGIES.   CO Q-10 PO Take 100 mg by mouth daily.   Eliquis  2.5 MG Tabs tablet Generic drug: apixaban  TAKE 1 TABLET BY MOUTH TWICE A DAY   esomeprazole  40 MG capsule Commonly known as: NexIUM  Take 1 capsule (40 mg total) by mouth daily.   ezetimibe  10 MG tablet Commonly known as: ZETIA  Take 1 tablet (10 mg total) by mouth daily. Pt needs office visit for further refills   Farxiga  10 MG Tabs tablet Generic drug: dapagliflozin   propanediol Take 10 mg by mouth in the morning.   fenofibrate  160 MG tablet TAKE 1 TABLET BY MOUTH EVERY DAY   folic acid  1 MG tablet Commonly known as: FOLVITE  TAKE 2 TABLETS BY MOUTH EVERY DAY   freestyle lancets USE ONCE A DAY TO CHECK BLOOD SUGAR.   FREESTYLE LITE test strip Generic drug: glucose blood USE TO TEST BLOOD SUGAR ONCE A DAY. DX CODE: E11.9   gabapentin  100 MG capsule Commonly known as: NEURONTIN  Take 100 mg by mouth at bedtime. PRN   glimepiride  2 MG tablet Commonly known as: AMARYL  TAKE 1 TABLET (2 MG TOTAL) BY MOUTH IN THE MORNING  AND AT BEDTIME.   ICY HOT ADVANCED PAIN RELIEF EX Apply 1 application  topically daily as needed (to painful sites).   Klor-Con  M20 20 MEQ tablet Generic drug: potassium chloride  SA TAKE 2 TABLETS BY MOUTH EVERY DAY   metoprolol  succinate 25 MG 24 hr tablet Commonly known as: TOPROL -XL TAKE 1 TABLET (25 MG TOTAL) BY MOUTH DAILY.   nitroGLYCERIN  0.4 MG SL tablet Commonly known as: NITROSTAT  PLACE 1 TABLET UNDER THE TONGUE EVERY 5 MINUTES AS NEEDED FOR CHEST PAIN.   pregabalin  150 MG capsule Commonly known as: LYRICA  TAKE 1 CAPSULE (150 MG TOTAL) BY MOUTH AT BEDTIME. (TAKE 100MG  DAILY AT NOON)   pregabalin  100 MG capsule Commonly known as: LYRICA  TAKE 1 CAPSULE (100 MG TOTAL) BY MOUTH DAILY. AT NOON   psyllium 58.6 % powder Commonly known as: METAMUCIL Take 1 packet by mouth daily as needed (for constipation- mix and drink).   torsemide  20 MG tablet Commonly known as: DEMADEX  TAKE 2 TAB BY MOUTH IN MORNING TAKE EXTRA TABLET FOR WEIGHT GAIN MORE THAN 2-3LBS IN 24HR FOR 90DAY   Trulicity  3 MG/0.5ML Soaj Generic drug: Dulaglutide  INJECT 3 MG INTO THE SKIN ONE TIME PER WEEK   Vascepa  1 g capsule Generic drug: icosapent  Ethyl TAKE 2 CAPSULES BY MOUTH 2 TIMES DAILY.   VITAMIN C  PO Take 1 tablet by mouth daily with breakfast.        Allergies:  Allergies  Allergen Reactions   Ace Inhibitors Swelling and Other (See Comments)    Angioedema    Benazepril Swelling and Other (See Comments)    Angioedema; he is not a candidate for any angiotensin receptor blockers because of this significant allergic reaction. Because of a history of documented adverse serious drug reaction;Medi Alert bracelet  is recommended   Entresto  [Sacubitril -Valsartan ] Swelling and Other (See Comments)    First-in-Class Angiotensin Receptor Neprilysin Inhibitor- Med was "red-flagged" by the patient's pharmacy for him to NOT take!   Hctz [Hydrochlorothiazide] Anaphylaxis and Swelling    Tongue and  lip swelling    Aspirin  Other (See Comments)    Gastritis, can aspirin  not take 325 mg aspirin     Past Medical History, Surgical history, Social history, and Family History were reviewed and updated.  Review of Systems: All other 10 point review of systems is negative.   Physical Exam:  weight is 237 lb (107.5 kg). His oral temperature is 97.6 F (36.4 C). His blood pressure is 120/56 (abnormal) and his pulse is 84. His respiration is 17 and oxygen  saturation is 99%.   Wt Readings from Last 3 Encounters:  12/09/23 237 lb (107.5 kg)  11/04/23 240 lb (108.9 kg)  10/07/23 239 lb 1.3 oz (108.4 kg)    Ocular: Sclerae unicteric, pupils equal, round and reactive to light Ear-nose-throat: Oropharynx clear, dentition fair Lymphatic: No cervical  or supraclavicular adenopathy Lungs no rales or rhonchi, good excursion bilaterally Heart regular rate and rhythm, no murmur appreciated Abd soft, nontender, positive bowel sounds MSK no focal spinal tenderness, no joint edema Neuro: non-focal, well-oriented, appropriate affect Breasts: Deferred   Lab Results  Component Value Date   WBC 6.4 12/09/2023   HGB 13.8 12/09/2023   HCT 42.6 12/09/2023   MCV 90.3 12/09/2023   PLT 186 12/09/2023   Lab Results  Component Value Date   FERRITIN 2,063 (H) 11/04/2023   IRON 96 11/04/2023   TIBC 343 11/04/2023   UIBC 247 11/04/2023   IRONPCTSAT 28 11/04/2023   Lab Results  Component Value Date   RETICCTPCT 1.7 04/07/2023   RBC 4.72 12/09/2023   RETICCTABS 52.1 11/22/2011   No results found for: "KPAFRELGTCHN", "LAMBDASER", "[facility]" Lab Results  Component Value Date   IGA 159 03/09/2012   Lab Results  Component Value Date   ALBUMINELP 4.2 07/27/2018   MSPIKE Not Observed 07/27/2018     Chemistry      Component Value Date/Time   NA 139 11/04/2023 1010   NA 138 02/08/2022 0811   NA 144 06/27/2017 0857   K 4.3 11/04/2023 1010   K 3.9 06/27/2017 0857   CL 100 11/04/2023 1010    CL 103 06/27/2017 0857   CO2 31 11/04/2023 1010   CO2 26 06/27/2017 0857   BUN 37 (H) 11/04/2023 1010   BUN 46 (H) 02/08/2022 0811   BUN 12 06/27/2017 0857   CREATININE 1.93 (H) 11/04/2023 1010   CREATININE 1.65 (H) 04/18/2020 0956      Component Value Date/Time   CALCIUM  9.7 11/04/2023 1010   CALCIUM  9.2 06/27/2017 0857   ALKPHOS 66 11/04/2023 1010   ALKPHOS 128 (H) 06/27/2017 0857   AST 20 11/04/2023 1010   ALT 16 11/04/2023 1010   ALT 24 06/27/2017 0857   BILITOT 0.6 11/04/2023 1010       Impression and Plan: Mr. Blanton is a very pleasant 83 yo caucasian gentleman with diffuse large B-cell lymphoma (not "double hit" lymphoma). He completed treatment in February 2019.  He is now on Xgeva  every 3 months along with PRN IV iron and IM B12.  We will proceed with B 12 injection.  Iron studies pending. We will replace if needed.  CBC and CMP routed to Dr. Garland Junk with nephrology and Brendon Caller, PA-C with ID.  Follow-up in 1 month.   Kennard Pea, NP 6/10/20259:58 AM

## 2023-12-12 ENCOUNTER — Other Ambulatory Visit: Payer: Self-pay | Admitting: Family Medicine

## 2023-12-14 NOTE — Progress Notes (Signed)
 Cardiology Office Note:   Date:  12/16/2023  ID:  Dempsey LITTIE Na, DOB Dec 28, 1940, MRN [id number] PCP: Antonio Meth, Jamee SAUNDERS, DO  [facility] Providers Cardiologist:  Lynwood Schilling, MD Electrophysiologist:  Elspeth Sage, MD {  History of Present Illness:   Richard [name] is a 83 y.o. male who presents for follow up of CAD and TAVR.  He has had atrial flutter and heart block as well necessitating a pacemaker.   He has had continued problems with triglycerides.  He has some renal insufficiency.    He had a leadless pacemaker placed.   This was in response to subclavian stenosis associated with a transvenous pacemaker.   He still has chronic thrombosis .  He also was in the hospital  at [facility] in [place] with acute sepsis.  The etiology was not clear.  He did recover from this.  He presented with altered mental status and loss of consciousness.  He has SOB in 2023 and had a perfusion study with no prior ischemia or infarct.  He had an EF of 30 - 35%.  He was back at [facility] after having had a [employer] dual-chamber pacemaker with left area bundle branch pacing inserted.     Since her last saw him he has felt very well.  He golfs every other day. The patient denies any new symptoms such as chest discomfort, neck or arm discomfort. There has been no new shortness of breath, PND or orthopnea. There have been no reported palpitations, presyncope or syncope.  He is on permanent antibiotics.    ROS: As stated in the HPI and negative for all other systems.  Studies Reviewed:    EKG:     NA    Risk Assessment/Calculations:    CHA2DS2-VASc Score = 6   This indicates a 9.7% annual risk of stroke. The patient's score is based upon: CHF History: 1 HTN History: 1 Diabetes History: 1 Stroke History: 0 Vascular Disease History: 1 Age Score: 2 Gender Score: 0   Physical Exam:   VS:  BP 132/70   Pulse 85   Ht 6' (1.829 m)   Wt 234 lb 6.4 oz (106.3 kg)   SpO2 92%   BMI 31.79 kg/m     Wt Readings from Last 3 Encounters:  12/16/23 234 lb 6.4 oz (106.3 kg)  12/09/23 237 lb (107.5 kg)  11/04/23 240 lb (108.9 kg)     GEN: Well nourished, well developed in no acute distress NECK: No JVD; No carotid bruits CARDIAC: RRR, 2 out of 6 apical systolic murmur radiating slightly at the tract, no diastolic murmurs, rubs, gallops RESPIRATORY:  Clear to auscultation without rales, wheezing or rhonchi  ABDOMEN: Soft, non-tender, non-distended EXTREMITIES: Trace bilateral lower edema; No deformity   ASSESSMENT AND PLAN:   Chronic systolic heart failure:    EF was 20 - 25% in October 2024.  He has not tolerated ACE inhibitor or Entresto  in the past.  His blood pressure is not tolerated further med titration.  He is euvolemic and feels very well.  He will continue with the meds as listed.   Hypertension:    The blood pressure is actually been very labile and not tolerated med titration.  At this point no change in therapy.   Atrial fibrillation: He tolerates anticoagulation and currently seems to be in sinus rhythm.   CAD s/p CABG 2001:   He had patent bypass at the time of his TAVR.  Continue with risk reduction.  2:1 AVB: He now has the  dual chamber pacemaker.   He is up-to-date with follow-up.  No change in therapy.  He has is followed at [facility]  AS s/p TAVR:    He had normal function on echo in 10/24.  No further imaging at this time.  The gradient was 9.5 most recently.    OSA on CPAP: He uses CPAP.      Follow up with me in 6 months  Signed, Lynwood Schilling, MD

## 2023-12-16 ENCOUNTER — Encounter: Payer: Self-pay | Admitting: Cardiology

## 2023-12-16 ENCOUNTER — Ambulatory Visit: Attending: Cardiology | Admitting: Cardiology

## 2023-12-16 VITALS — BP 132/70 | HR 85 | Ht 72.0 in | Wt 234.4 lb

## 2023-12-16 DIAGNOSIS — Z952 Presence of prosthetic heart valve: Secondary | ICD-10-CM | POA: Diagnosis not present

## 2023-12-16 DIAGNOSIS — I25810 Atherosclerosis of coronary artery bypass graft(s) without angina pectoris: Secondary | ICD-10-CM | POA: Insufficient documentation

## 2023-12-16 DIAGNOSIS — I5022 Chronic systolic (congestive) heart failure: Secondary | ICD-10-CM | POA: Diagnosis not present

## 2023-12-16 NOTE — Patient Instructions (Signed)
 Medication Instructions:  Your physician recommends that you continue on your current medications as directed. Please refer to the Current Medication list given to you today.  *If you need a refill on your cardiac medications before your next appointment, please call your pharmacy*  Lab Work: NONE If you have labs (blood work) drawn today and your tests are completely normal, you will receive your results only by: MyChart Message (if you have MyChart) OR A paper copy in the mail If you have any lab test that is abnormal or we need to change your treatment, we will call you to review the results.  Testing/Procedures: NONE  Follow-Up: At [facility], you and your health needs are our priority.  As part of our continuing mission to provide you with exceptional heart care, our providers are all part of one team.  This team includes your primary Cardiologist (physician) and Advanced Practice Providers or APPs (Physician Assistants and Nurse Practitioners) who all work together to provide you with the care you need, when you need it.  Your next appointment:   6 month(s)  Provider:   Eilleen Grates, MD    We recommend signing up for the patient portal called "MyChart".  Sign up information is provided on this After Visit Summary.  MyChart is used to connect with patients for Virtual Visits (Telemedicine).  Patients are able to view lab/test results, encounter notes, upcoming appointments, etc.  Non-urgent messages can be sent to your provider as well.   To learn more about what you can do with MyChart, go to [email or web address].

## 2023-12-19 ENCOUNTER — Encounter: Payer: Self-pay | Admitting: Family Medicine

## 2023-12-19 ENCOUNTER — Ambulatory Visit: Admitting: Family Medicine

## 2023-12-19 VITALS — BP 114/78 | HR 86 | Temp 97.7°F | Resp 18 | Ht 72.0 in | Wt 235.0 lb

## 2023-12-19 DIAGNOSIS — I350 Nonrheumatic aortic (valve) stenosis: Secondary | ICD-10-CM | POA: Diagnosis not present

## 2023-12-19 DIAGNOSIS — E1165 Type 2 diabetes mellitus with hyperglycemia: Secondary | ICD-10-CM

## 2023-12-19 DIAGNOSIS — N183 Chronic kidney disease, stage 3 unspecified: Secondary | ICD-10-CM | POA: Diagnosis not present

## 2023-12-19 DIAGNOSIS — I25118 Atherosclerotic heart disease of native coronary artery with other forms of angina pectoris: Secondary | ICD-10-CM | POA: Diagnosis not present

## 2023-12-19 DIAGNOSIS — E785 Hyperlipidemia, unspecified: Secondary | ICD-10-CM

## 2023-12-19 DIAGNOSIS — I5042 Chronic combined systolic (congestive) and diastolic (congestive) heart failure: Secondary | ICD-10-CM

## 2023-12-19 DIAGNOSIS — I129 Hypertensive chronic kidney disease with stage 1 through stage 4 chronic kidney disease, or unspecified chronic kidney disease: Secondary | ICD-10-CM | POA: Diagnosis not present

## 2023-12-19 DIAGNOSIS — E1169 Type 2 diabetes mellitus with other specified complication: Secondary | ICD-10-CM

## 2023-12-19 DIAGNOSIS — Z7984 Long term (current) use of oral hypoglycemic drugs: Secondary | ICD-10-CM

## 2023-12-19 DIAGNOSIS — D500 Iron deficiency anemia secondary to blood loss (chronic): Secondary | ICD-10-CM | POA: Diagnosis not present

## 2023-12-19 DIAGNOSIS — E1122 Type 2 diabetes mellitus with diabetic chronic kidney disease: Secondary | ICD-10-CM | POA: Diagnosis not present

## 2023-12-19 DIAGNOSIS — I10 Essential (primary) hypertension: Secondary | ICD-10-CM | POA: Diagnosis not present

## 2023-12-19 DIAGNOSIS — I4821 Permanent atrial fibrillation: Secondary | ICD-10-CM

## 2023-12-19 DIAGNOSIS — Z7985 Long-term (current) use of injectable non-insulin antidiabetic drugs: Secondary | ICD-10-CM

## 2023-12-19 MED ORDER — EZETIMIBE 10 MG PO TABS: 10.0000 mg | ORAL_TABLET | Freq: Every day | ORAL | 1 refills | Status: DC

## 2023-12-19 NOTE — Progress Notes (Unsigned)
   Established Patient Office Visit  Subjective   Patient ID: Richard [name], male    DOB: 16-Oct-1940  Age: 83 y.o. MRN: [id number]  Chief Complaint  Patient presents with   Diabetes   Follow-up    HPI  {History (Optional):23778}  ROS    Objective:     BP 114/78 (BP Location: Left Arm, Patient Position: Sitting, Cuff Size: Large)   Pulse 86   Temp 97.7 F (36.5 C) (Oral)   Resp 18   Ht 6' (1.829 m)   Wt 235 lb (106.6 kg)   SpO2 94%   BMI 31.87 kg/m  {Vitals History (Optional):23777}  Physical Exam   No results found for any visits on 12/19/23.  {Labs (Optional):23779}  The ASCVD Risk score (Arnett DK, et al., 2019) failed to calculate for the following reasons:   The 2019 ASCVD risk score is only valid for ages 79 to 29   Risk score cannot be calculated because patient has a medical history suggesting prior/existing ASCVD    Assessment & Plan:   Problem List Items Addressed This Visit       Unprioritized   Coronary artery disease   Relevant Medications   ezetimibe  (ZETIA ) 10 MG tablet   Type 2 DM with CKD stage 3 and hypertension (HCC) - Primary   Relevant Medications   ezetimibe  (ZETIA ) 10 MG tablet   Aortic stenosis, severe   Relevant Medications   ezetimibe  (ZETIA ) 10 MG tablet   Anemia, iron deficiency   Chronic combined systolic and diastolic heart failure (HCC)   Relevant Medications   ezetimibe  (ZETIA ) 10 MG tablet   Permanent atrial fibrillation (HCC)   Relevant Medications   ezetimibe  (ZETIA ) 10 MG tablet   Hyperlipidemia associated with type 2 diabetes mellitus (HCC)   Relevant Medications   ezetimibe  (ZETIA ) 10 MG tablet   Essential hypertension   Relevant Medications   ezetimibe  (ZETIA ) 10 MG tablet   Other Visit Diagnoses       Chronic renal insufficiency, stage 3 (moderate) (HCC)           No follow-ups on file.    Yoskar Murrillo R Lowne Chase, DO

## 2023-12-20 LAB — COMPREHENSIVE METABOLIC PANEL WITH GFR
AG Ratio: 1.7 (calc) (ref 1.0–2.5)
ALT: 19 U/L (ref 9–46)
AST: 21 U/L (ref 10–35)
Albumin: 4.5 g/dL (ref 3.6–5.1)
Alkaline phosphatase (APISO): 74 U/L (ref 35–144)
BUN/Creatinine Ratio: 15 (calc) (ref 6–22)
BUN: 31 mg/dL — ABNORMAL HIGH (ref 7–25)
CO2: 26 mmol/L (ref 20–32)
Calcium: 10.1 mg/dL (ref 8.6–10.3)
Chloride: 101 mmol/L (ref 98–110)
Creat: 2.01 mg/dL — ABNORMAL HIGH (ref 0.70–1.22)
Globulin: 2.6 g/dL (ref 1.9–3.7)
Glucose, Bld: 126 mg/dL — ABNORMAL HIGH (ref 65–99)
Potassium: 4 mmol/L (ref 3.5–5.3)
Sodium: 140 mmol/L (ref 135–146)
Total Bilirubin: 0.6 mg/dL (ref 0.2–1.2)
Total Protein: 7.1 g/dL (ref 6.1–8.1)
eGFR: 32 mL/min/{1.73_m2} — ABNORMAL LOW (ref >=60)

## 2023-12-20 LAB — CBC WITH DIFFERENTIAL/PLATELET
Absolute Lymphocytes: 2025 {cells}/uL (ref 850–3900)
Absolute Monocytes: 689 {cells}/uL (ref 200–950)
Basophils Absolute: 49 {cells}/uL (ref 0–200)
Basophils Relative: 0.6 %
Eosinophils Absolute: 361 {cells}/uL (ref 15–500)
Eosinophils Relative: 4.4 %
HCT: 44.5 % (ref 38.5–50.0)
Hemoglobin: 14.5 g/dL (ref 13.2–17.1)
MCH: 29.6 pg (ref 27.0–33.0)
MCHC: 32.6 g/dL (ref 32.0–36.0)
MCV: 90.8 fL (ref 80.0–100.0)
MPV: 9.7 fL (ref 7.5–12.5)
Monocytes Relative: 8.4 %
Neutro Abs: 5076 {cells}/uL (ref 1500–7800)
Neutrophils Relative %: 61.9 %
Platelets: 218 10*3/uL (ref 140–400)
RBC: 4.9 10*6/uL (ref 4.20–5.80)
RDW: 15.3 % — ABNORMAL HIGH (ref 11.0–15.0)
Total Lymphocyte: 24.7 %
WBC: 8.2 10*3/uL (ref 3.8–10.8)

## 2023-12-20 LAB — LIPID PANEL
Cholesterol: 174 mg/dL (ref <200)
HDL: 30 mg/dL — ABNORMAL LOW (ref >=40)
Non-HDL Cholesterol (Calc): 144 mg/dL — ABNORMAL HIGH (ref <130)
Total CHOL/HDL Ratio: 5.8 (calc) — ABNORMAL HIGH (ref <5.0)
Triglycerides: 788 mg/dL — ABNORMAL HIGH (ref <150)

## 2023-12-20 LAB — HEMOGLOBIN A1C
Hgb A1c MFr Bld: 8 % — ABNORMAL HIGH (ref <5.7)
Mean Plasma Glucose: 183 mg/dL
eAG (mmol/L): 10.1 mmol/L

## 2023-12-20 LAB — MICROALBUMIN / CREATININE URINE RATIO
Creatinine, Urine: 37 mg/dL (ref 20–320)
Microalb Creat Ratio: 5 mg/g{creat} (ref <30)
Microalb, Ur: 0.2 mg/dL

## 2023-12-22 NOTE — Assessment & Plan Note (Signed)
 Well controlled, no changes to meds. Encouraged heart healthy diet such as the DASH diet and exercise as tolerated.

## 2023-12-22 NOTE — Assessment & Plan Note (Signed)
 Encourage heart healthy diet such as MIND or DASH diet, increase exercise, avoid trans fats, simple carbohydrates and processed foods, consider a krill or fish or flaxseed oil cap daily.

## 2023-12-22 NOTE — Assessment & Plan Note (Signed)
 hgba1c to be checked , minimize simple carbs. Increase exercise as tolerated. Continue current meds   Lab Results  Component Value Date   HGBA1C 8.0 (H) 12/19/2023

## 2023-12-23 ENCOUNTER — Encounter: Payer: Self-pay | Admitting: Dermatology

## 2023-12-23 ENCOUNTER — Ambulatory Visit (INDEPENDENT_AMBULATORY_CARE_PROVIDER_SITE_OTHER): Payer: Medicare Other | Admitting: Dermatology

## 2023-12-23 DIAGNOSIS — D492 Neoplasm of unspecified behavior of bone, soft tissue, and skin: Secondary | ICD-10-CM

## 2023-12-23 DIAGNOSIS — W268XXA Contact with other sharp object(s), not elsewhere classified, initial encounter: Secondary | ICD-10-CM | POA: Diagnosis not present

## 2023-12-23 DIAGNOSIS — L578 Other skin changes due to chronic exposure to nonionizing radiation: Secondary | ICD-10-CM

## 2023-12-23 DIAGNOSIS — C44622 Squamous cell carcinoma of skin of right upper limb, including shoulder: Secondary | ICD-10-CM | POA: Diagnosis not present

## 2023-12-23 DIAGNOSIS — C44722 Squamous cell carcinoma of skin of right lower limb, including hip: Secondary | ICD-10-CM

## 2023-12-23 DIAGNOSIS — S0081XA Abrasion of other part of head, initial encounter: Secondary | ICD-10-CM | POA: Diagnosis not present

## 2023-12-23 DIAGNOSIS — W908XXA Exposure to other nonionizing radiation, initial encounter: Secondary | ICD-10-CM | POA: Diagnosis not present

## 2023-12-23 DIAGNOSIS — Z85828 Personal history of other malignant neoplasm of skin: Secondary | ICD-10-CM

## 2023-12-23 DIAGNOSIS — T148XXA Other injury of unspecified body region, initial encounter: Secondary | ICD-10-CM

## 2023-12-23 DIAGNOSIS — L570 Actinic keratosis: Secondary | ICD-10-CM | POA: Diagnosis not present

## 2023-12-23 DIAGNOSIS — T07XXXA Unspecified multiple injuries, initial encounter: Secondary | ICD-10-CM

## 2023-12-23 NOTE — Progress Notes (Signed)
 Follow-Up Visit   Subjective  Richard [name] is a 83 y.o. male who presents for the following: 4 month AK follow up. Hx of PDT treatment on face 09/23/2023. Hx of LN2 treatment to arms and hands. Hx of SCCs  Has a few areas of concern today. Right arm, right hand, right leg.   The patient has spots, moles and lesions to be evaluated, some may be new or changing and the patient may have concern these could be cancer.   The following portions of the chart were reviewed this encounter and updated as appropriate: medications, allergies, medical history  Review of Systems:  No other skin or systemic complaints except as noted in HPI or Assessment and Plan.  Objective  Well appearing patient in no apparent distress; mood and affect are within normal limits.  A focused examination was performed of the following areas: Face, ears, neck, arms, legs  Relevant exam findings are noted in the Assessment and Plan.  Left Ala Nasi x1 Erythematous thin papules/macules with gritty scale.  Right Mid Forearm Thenar 1.3 cm keratotic heme crusted keratotic nodule  Right Thenar Hand - Dorsum 7 mm keratotic papule  Right Upper pretibial 6 mm keratotic papule   Assessment & Plan   ACTINIC DAMAGE - chronic, secondary to cumulative UV radiation exposure/sun exposure over time - diffuse scaly erythematous macules with underlying dyspigmentation - Recommend daily broad spectrum sunscreen SPF 30+ to sun-exposed areas, reapply every 2 hours as needed.  - Recommend staying in the shade or wearing long sleeves, sun glasses (UVA+UVB protection) and wide brim hats (4-inch brim around the entire circumference of the hat). - Call for new or changing lesions.    EXCORIATIONS, secondary to shaving per patient.  Exam: Excoriations at R lateral cheek at face  Treatment Plan: Recommend vaseline. Recheck on f/up. Call if not resolving.  HISTORY OF SQUAMOUS CELL CARCINOMA OF THE SKIN - No evidence of  recurrence today- L thenar hand dorsum - Recommend regular full body skin exams - Recommend daily broad spectrum sunscreen SPF 30+ to sun-exposed areas, reapply every 2 hours as needed.  - Call if any new or changing lesions are noted between office visits     AK (ACTINIC KERATOSIS) Left Ala Nasi x1 Actinic keratoses are precancerous spots that appear secondary to cumulative UV radiation exposure/sun exposure over time. They are chronic with expected duration over 1 year. A portion of actinic keratoses will progress to squamous cell carcinoma of the skin. It is not possible to reliably predict which spots will progress to skin cancer and so treatment is recommended to prevent development of skin cancer.  Recommend daily broad spectrum sunscreen SPF 30+ to sun-exposed areas, reapply every 2 hours as needed.  Recommend staying in the shade or wearing long sleeves, sun glasses (UVA+UVB protection) and wide brim hats (4-inch brim around the entire circumference of the hat). Call for new or changing lesions. Destruction of lesion - Left Ala Nasi x1  Destruction method: cryotherapy   Informed consent: discussed and consent obtained   Lesion destroyed using liquid nitrogen: Yes   Region frozen until ice ball extended beyond lesion: Yes   Outcome: patient tolerated procedure well with no complications   Post-procedure details: wound care instructions given   Additional details:  Prior to procedure, discussed risks of blister formation, small wound, skin dyspigmentation, or rare scar following cryotherapy. Recommend Vaseline ointment to treated areas while healing.  NEOPLASM OF SKIN (3) Right Mid Forearm Thenar Epidermal /  dermal shaving  Lesion diameter (cm):  1.3 Informed consent: discussed and consent obtained   Patient was prepped and draped in usual sterile fashion: Area prepped with alcohol. Anesthesia: the lesion was anesthetized in a standard fashion   Anesthetic:  1% lidocaine  w/  epinephrine  1-100,000 buffered w/ 8.4% NaHCO3 Instrument used: flexible razor blade   Hemostasis achieved with: pressure, aluminum chloride and electrodesiccation   Outcome: patient tolerated procedure well    Destruction of lesion  Destruction method: electrodesiccation and curettage   Informed consent: discussed and consent obtained   Curettage performed in three different directions: Yes   Electrodesiccation performed over the curetted area: Yes   Final wound size (cm):  1.5 Hemostasis achieved with:  pressure, aluminum chloride and electrodesiccation Outcome: patient tolerated procedure well with no complications   Post-procedure details: wound care instructions given   Additional details:  Mupirocin  ointment and Bandaid applied  Specimen 1 - Surgical pathology Differential Diagnosis: R/O SCC  Check Margins: No  EDC today Right Thenar Hand - Dorsum Epidermal / dermal shaving  Lesion diameter (cm):  0.7 Informed consent: discussed and consent obtained   Patient was prepped and draped in usual sterile fashion: Area prepped with alcohol. Anesthesia: the lesion was anesthetized in a standard fashion   Anesthetic:  1% lidocaine  w/ epinephrine  1-100,000 buffered w/ 8.4% NaHCO3 Instrument used: flexible razor blade   Hemostasis achieved with: pressure, aluminum chloride and electrodesiccation   Outcome: patient tolerated procedure well    Destruction of lesion  Destruction method: electrodesiccation and curettage   Informed consent: discussed and consent obtained   Curettage performed in three different directions: Yes   Electrodesiccation performed over the curetted area: Yes   Final wound size (cm):  1.3 Hemostasis achieved with:  pressure, aluminum chloride and electrodesiccation Outcome: patient tolerated procedure well with no complications   Post-procedure details: wound care instructions given   Additional details:  Mupirocin  ointment and Bandaid applied  Specimen 2 -  Surgical pathology Differential Diagnosis: Hypertrophic AK, R/O SCC  Check Margins: No  EDC today Right Upper pretibial Epidermal / dermal shaving  Lesion diameter (cm):  0.6 Informed consent: discussed and consent obtained   Patient was prepped and draped in usual sterile fashion: Area prepped with alcohol. Anesthesia: the lesion was anesthetized in a standard fashion   Anesthetic:  1% lidocaine  w/ epinephrine  1-100,000 buffered w/ 8.4% NaHCO3 Instrument used: flexible razor blade   Hemostasis achieved with: pressure, aluminum chloride and electrodesiccation   Outcome: patient tolerated procedure well    Destruction of lesion  Destruction method: electrodesiccation and curettage   Informed consent: discussed and consent obtained   Curettage performed in three different directions: Yes   Electrodesiccation performed over the curetted area: Yes   Final wound size (cm):  0.7 Hemostasis achieved with:  pressure, aluminum chloride and electrodesiccation Outcome: patient tolerated procedure well with no complications   Post-procedure details: wound care instructions given   Additional details:  Mupirocin  ointment and Bandaid applied  Specimen 3 - Surgical pathology Differential Diagnosis: Hypertrophic AK, R/O SCC  Check Margins: No  EDC today ACTINIC SKIN DAMAGE   HISTORY OF SCC (SQUAMOUS CELL CARCINOMA) OF SKIN   MULTIPLE EXCORIATIONS   EXCORIATION    Return in about 3 months (around 03/24/2024) for 2-3 mo AK Follow Up, Biopsy Follow Up, Sun Exposed Exam.  I, Kate Fought, CMA, am acting as scribe for Rexene Rattler, MD.   Documentation: I have reviewed the above documentation for accuracy and completeness, and  I agree with the above.  Rexene Rattler, MD

## 2023-12-23 NOTE — Patient Instructions (Signed)
 Cryotherapy Aftercare  Wash gently with soap and water everyday.   Apply Vaseline and Band-Aid daily until healed.    Wound Care Instructions  Cleanse wound gently with soap and water once a day then pat dry with clean gauze. Apply a thin coat of Petrolatum (petroleum jelly, "Vaseline") over the wound (unless you have an allergy to this). We recommend that you use a new, sterile tube of Vaseline. Do not pick or remove scabs. Do not remove the yellow or white "healing tissue" from the base of the wound.  Cover the wound with fresh, clean, nonstick gauze and secure with paper tape. You may use Band-Aids in place of gauze and tape if the wound is small enough, but would recommend trimming much of the tape off as there is often too much. Sometimes Band-Aids can irritate the skin.  You should call the office for your biopsy report after 1 week if you have not already been contacted.  If you experience any problems, such as abnormal amounts of bleeding, swelling, significant bruising, significant pain, or evidence of infection, please call the office immediately.  FOR ADULT SURGERY PATIENTS: If you need something for pain relief you may take 1 extra strength Tylenol (acetaminophen) AND 2 Ibuprofen (200mg  each) together every 4 hours as needed for pain. (do not take these if you are allergic to them or if you have a reason you should not take them.) Typically, you may only need pain medication for 1 to 3 days.     Due to recent changes in healthcare laws, you may see results of your pathology and/or laboratory studies on MyChart before the doctors have had a chance to review them. We understand that in some cases there may be results that are confusing or concerning to you. Please understand that not all results are received at the same time and often the doctors may need to interpret multiple results in order to provide you with the best plan of care or course of treatment. Therefore, we ask that you  please give [place] 2 business days to thoroughly review all your results before contacting the office for clarification. Should we see a critical lab result, you will be contacted sooner.   If You Need Anything After Your Visit  If you have any questions or concerns for your doctor, please call our main line at [phone] and press option 4 to reach your doctor's medical assistant. If no one answers, please leave a voicemail as directed and we will return your call as soon as possible. Messages left after 4 pm will be answered the following business day.   You may also send [place] a message via MyChart. We typically respond to MyChart messages within 1-2 business days.  For prescription refills, please ask your pharmacy to contact our office. Our fax number is [phone].  If you have an urgent issue when the clinic is closed that cannot wait until the next business day, you can page your doctor at the number below.    Please note that while we do our best to be available for urgent issues outside of office hours, we are not available 24/7.   If you have an urgent issue and are unable to reach [place], you may choose to seek medical care at your doctor's office, retail clinic, urgent care center, or emergency room.  If you have a medical emergency, please immediately call 911 or go to the emergency department.  Pager Numbers  - Dr. Gwen Pounds: [phone]  -  Dr. Roseanne Reno: [phone]  - Dr. Katrinka Blazing: [phone]   In the event of inclement weather, please call our main line at [phone] for an update on the status of any delays or closures.  Dermatology Medication Tips: Please keep the boxes that topical medications come in in order to help keep track of the instructions about where and how to use these. Pharmacies typically print the medication instructions only on the boxes and not directly on the medication tubes.   If your medication is too expensive, please contact our office at  [phone] option 4 or send [place] a message through MyChart.   We are unable to tell what your co-pay for medications will be in advance as this is different depending on your insurance coverage. However, we may be able to find a substitute medication at lower cost or fill out paperwork to get insurance to cover a needed medication.   If a prior authorization is required to get your medication covered by your insurance company, please allow [place] 1-2 business days to complete this process.  Drug prices often vary depending on where the prescription is filled and some pharmacies may offer cheaper prices.  The website www.goodrx.com contains coupons for medications through different pharmacies. The prices here do not account for what the cost may be with help from insurance (it may be cheaper with your insurance), but the website can give you the price if you did not use any insurance.  - You can print the associated coupon and take it with your prescription to the pharmacy.  - You may also stop by our office during regular business hours and pick up a GoodRx coupon card.  - If you need your prescription sent electronically to a different pharmacy, notify our office through [facility] or by phone at [phone] option 4.     Si Usted Necesita Algo Despus de Su Visita  Tambin puede enviarnos un mensaje a travs de Clinical cytogeneticist. Por lo general respondemos a los mensajes de MyChart en el transcurso de 1 a 2 das hbiles.  Para renovar recetas, por favor pida a su farmacia que se ponga en contacto con nuestra oficina. Annie Sable de fax es [place] [phone].  Si tiene un asunto urgente cuando la clnica est cerrada y que no puede esperar hasta el siguiente da hbil, puede llamar/localizar a su doctor(a) al nmero que aparece a continuacin.   Por favor, tenga en cuenta que aunque hacemos todo lo posible para estar disponibles para asuntos urgentes fuera del horario de [place], no estamos  disponibles las 24 horas del da, los 7 [address]  [address] de la [place].   Si tiene un problema urgente y no puede comunicarse con nosotros, puede optar por buscar atencin mdica  en el consultorio de su doctor(a), en una clnica privada, en un centro de atencin urgente o en una sala de emergencias.  Si tiene Engineer, drilling, por favor llame inmediatamente al 911 o vaya a la sala de emergencias.  Nmeros de bper  - Dr. Gwen Pounds: [phone]  - Dra. Roseanne Reno: [phone]  - Dr. Katrinka Blazing: [phone]   En caso de inclemencias del tiempo, por favor llame a Lacy Duverney principal al [phone] para una actualizacin sobre el [place] de cualquier retraso o cierre.  Consejos para la medicacin en dermatologa: Por favor, guarde las cajas en las que vienen los medicamentos de uso tpico para ayudarle a seguir las instrucciones sobre dnde y cmo usarlos. Las farmacias generalmente imprimen las instrucciones del medicamento slo en las  cajas y no directamente en los tubos del medicamento.   Si su medicamento es muy caro, por favor, pngase en contacto con Rolm Gala llamando al [phone] y presione la opcin 4 o envenos un mensaje a travs de Clinical cytogeneticist.   No podemos decirle cul ser su copago por los medicamentos por adelantado ya que esto es diferente dependiendo de la cobertura de su seguro. Sin embargo, es posible que podamos encontrar un medicamento sustituto a Audiological scientist un formulario para que el seguro cubra el medicamento que se considera necesario.   Si se requiere una autorizacin previa para que su compaa de seguros [place] su medicamento, por favor permtanos de 1 a 2 das hbiles para completar [address]  Los precios de los medicamentos varan con frecuencia dependiendo del Environmental consultant de dnde se surte la receta y alguna farmacias pueden ofrecer precios ms baratos.  El sitio web www.goodrx.com tiene cupones para medicamentos de Health and safety inspector. Los precios aqu no  tienen en cuenta lo que podra costar con la ayuda del seguro (puede ser ms barato con su seguro), pero el sitio web puede darle el precio si no utiliz Tourist information centre manager.  - Puede imprimir el cupn correspondiente y llevarlo con su receta a la farmacia.  - Tambin puede pasar por nuestra oficina durante el horario de atencin regular y Education officer, museum una tarjeta de cupones de GoodRx.  - Si necesita que su receta se enve electrnicamente a una farmacia diferente, informe a nuestra oficina a travs de MyChart de [place] o por telfono llamando al [phone] y presione la opcin 4.

## 2023-12-24 ENCOUNTER — Telehealth: Payer: Self-pay | Admitting: Pharmacist

## 2023-12-24 NOTE — Telephone Encounter (Signed)
 Called patient to check in regarding medications and set follow up for November for [facility].  LM on VM

## 2023-12-25 ENCOUNTER — Ambulatory Visit: Payer: Self-pay

## 2023-12-25 LAB — SURGICAL PATHOLOGY

## 2023-12-25 NOTE — Telephone Encounter (Signed)
 FYI Only or Action Required?: FYI only for provider.  Patient was last seen in primary care on 12/19/2023 by Antonio Meth, Jamee SAUNDERS, DO. Called Nurse Triage reporting Dizziness. Symptoms began yesterday. Interventions attempted: Nothing. Symptoms are: stable.  Triage Disposition: See Physician Within 24 Hours  Patient/caregiver understands and will follow disposition?: Yes                             Copied from CRM [id number]. Topic: Clinical - Red Word Triage >> Dec 25, 2023 11:04 AM Mesmerise C wrote: [facility] that prompted transfer to Nurse Triage: Patient stated last night after eating he got up was feeling dizzy his sugar was 230 and his BP was 143 after eating and today it's 126/58 he's been checking his sugar it was 233 and earlier this morning it was 203, states when he stands up he feels lightheaded Reason for Disposition  [1] MODERATE dizziness (e.g., interferes with normal activities) AND [2] has NOT been evaluated by doctor (or NP/PA) for this  (Exception: Dizziness caused by heat exposure, sudden standing, or poor fluid intake.)  Answer Assessment - Initial Assessment Questions 1. DESCRIPTION: Describe your dizziness.     Dizziness upon standing 2. LIGHTHEADED: Do you feel lightheaded? (e.g., somewhat faint, woozy, weak upon standing)     Yes, wooziness 4. SEVERITY: How bad is it?  Do you feel like you are going to faint? Can you stand and walk?   - MILD: Feels slightly dizzy, but walking normally.   - MODERATE: Feels unsteady when walking, but not falling; interferes with normal activities (e.g., school, work).   - SEVERE: Unable to walk without falling, or requires assistance to walk without falling; feels like passing out now.      Mild/moderate  5. ONSET:  When did the dizziness begin?     Last night 6. AGGRAVATING FACTORS: Does anything make it worse? (e.g., standing, change in head position)     Standing up  7. HEART RATE: Can you  tell me your heart rate? How many beats in 15 seconds?  (Note: not all patients can do this)       73 8. CAUSE: What do you think is causing the dizziness?     Unsure, states he played golf 3 x this week in the morning  9. RECURRENT SYMPTOM: Have you had dizziness before? If Yes, ask: When was the last time? What happened that time?     I have in my lifetime, but not recently  10. OTHER SYMPTOMS: Do you have any other symptoms? (e.g., fever, chest pain, vomiting, diarrhea, bleeding)       BP last night 143/72 (typically runs 110-120s), BS last night was 233, BP today was 126/58, BS today was 203 before breakfast Denies headache, denies fever    Patient requested first available appointment, scheduled with alternate provider in office tomorrow morning.  Protocols used: Dizziness - Lightheadedness-A-AH

## 2023-12-25 NOTE — Progress Notes (Addendum)
 Subjective:     Patient ID: Richard [name], male    DOB: September 01, 1940, 83 y.o.   MRN: [id number]  Chief Complaint  Patient presents with   Dizziness    Wed and Thurs was feeling dizziness and wooziness, checked sugar and BP and both was high; feeling much better today. This am sugar was 192 no breakfast    HPI  Richard [name] 83 year old male who presents for acute visit. Presents with complaints of dizziness. Reports an episode of dizziness yesterday upon sitting up from his chair to standing.  Patient notes that he does feel better today is not experiencing dizziness. He reports fasting blood sugars ranging from 170-190 mg/dL. Patient is an avid golfer, playing several times per week, typically in the early mornings.    Patient denies fever, chills, SOB, CP, palpitations, dyspnea, edema, HA, vision changes, N/V/D, abdominal pain, urinary symptoms, rash, weight changes, and recent illness or hospitalizations. No recent falls, head trauma, or loss of consciousness. Denies associated nausea, visual changes, weakness, numbness, diplopia, dysarthria, ataxia. No recent illness, ETOH, substance use, fever, or new medications reported.    Medical history of CHF, CAD, DM II, HTN, HLD, OSA, Atrial fibrillation, Pacemaker.  Denies-   DM Type 2 Dulaglutide  (Trulicity ) 3 mg injection weekly, Farxiga  10 mg daily, glimepiride  (Amaryl ) 2 mg BID, Metformin  stopper r/t risint Cr BS has been elevated- He has been uncontrolled.  Sugars this AM- 190, recheck  HTN Metoprolol  25 mg daily, torsemide  20 mg as needed  BP Readings from Last 1 Encounters:  12/26/23 120/70             History of Present Illness              There are no preventive care reminders to display for this patient.   Past Medical History:  Diagnosis Date   Anemia    Arthritis    hips   Axillary adenopathy 02/25/2017   Bradycardia    a. holter monitor has demonstrated HRs in 30s and Weinkibach    CAD  (coronary artery disease)    a. s/p CABG 2001  b.  07/28/2017 cath:   Severe three-vessel native CAD with total occlusion of LAD, ramus intermedius, first OM and RCA, patent RIMA to PDA, LIMA to LAD, sequential SVG to ramus intermedius and first OM.     Chronic lower back pain    Diffuse large B cell lymphoma (HCC)    Diverticulosis    Esophageal stricture    GERD (gastroesophageal reflux disease)    History of gout    HTN (hypertension)    Mixed hyperlipidemia    OSA on CPAP    with 2L O2 at night   Pancytopenia (HCC)    a. related to chemo therapy for B cell lymphoma   Peptic stricture of esophagus    Presence of permanent cardiac pacemaker    sees Dr. Maya pacemaker   SCC (squamous cell carcinoma) 01/31/2021   R zygoma, [facility]   SCC (squamous cell carcinoma) 01/31/2021   L post ankle, [facility]   SCC (squamous cell carcinoma) 01/31/2021   L popliteal, [facility]   Severe aortic stenosis    Spinal stenosis    Squamous cell carcinoma of skin 03/22/2009   Right mandible. SCCis, hypertrophic.    Squamous cell carcinoma of skin 12/25/2021   R lateral cheek, [facility]   Squamous cell carcinoma of skin 12/25/2021   R postauricular neck, ED[facility]   Squamous cell carcinoma of skin  12/25/2021   R forearm sup, [facility]   Squamous cell carcinoma of skin 12/25/2021   R wrist, [facility]   Squamous cell carcinoma of skin 08/27/2023   Left thenar hand dorsum, ED[facility]   Type II diabetes mellitus (HCC)    Wears dentures    partial upper    Past Surgical History:  Procedure Laterality Date   APPENDECTOMY  ~ 1952   BACK SURGERY     CATARACT EXTRACTION W/PHACO Left 11/06/2016   Procedure: CATARACT EXTRACTION PHACO AND INTRAOCULAR LENS PLACEMENT (IOC);  Surgeon: Dingeldein, Steven, MD;  Location: [facility] ORS;  Service: Ophthalmology;  Laterality: Left;  Lot # [id number] US : 01:09.4 AP%:25.2 CDE: 30.64   CATARACT EXTRACTION W/PHACO Right 12/04/2016   Procedure: CATARACT EXTRACTION PHACO AND INTRAOCULAR LENS PLACEMENT  (IOC);  Surgeon: Dingeldein, Steven, MD;  Location: [facility] ORS;  Service: Ophthalmology;  Laterality: Right;  US  1:25.9 AP% 24.1 CDE 39.10 Fluid Pack lot # [id number]   COLONOSCOPY W/ BIOPSIES AND POLYPECTOMY  07/02/2011   CORONARY ANGIOPLASTY  07/02/1991   CORONARY ANGIOPLASTY WITH STENT PLACEMENT  05/31/1997   1   CORONARY ARTERY BYPASS GRAFT  03/01/2000   CABG X5   ECTROPION REPAIR Right 09/01/2018   Procedure: REPAIR OF ECTROPION BILATERAL upper and lower;  Surgeon: Ashley Greig HERO, MD;  Location: [facility];  Service: Ophthalmology;  Laterality: Right;  Diabetic - oral meds sleep apnea   ESOPHAGEAL DILATION  X 3-4   Dr. Sheppard Finn; last one was in the 1990's   ESOPHAGOGASTRODUODENOSCOPY     multiple   FLEXIBLE SIGMOIDOSCOPY     multiple   HYDRADENITIS EXCISION Left 02/25/2017   Procedure: EXCISION DEEP LEFT AXILLARY LYMPH NODE;  Surgeon: Gail Favorite, MD;  Location[facility] OR;  Service: General;  Laterality: Left;   INTRAOPERATIVE TRANSTHORACIC ECHOCARDIOGRAM N/A 09/09/2017   Procedure: INTRAOPERATIVE TRANSTHORACIC ECHOCARDIOGRAM;  Surgeon: Verlin Lonni BIRCH, MD;  Location: [facility] OR;  Service: Open Heart Surgery;  Laterality: N/A;   KNEE ARTHROSCOPY Left 07/01/2009   meniscus repair   LEFT HEART CATHETERIZATION WITH CORONARY/GRAFT ANGIOGRAM N/A 03/16/2014   Procedure: LEFT HEART CATHETERIZATION WITH EL BILE;  Surgeon: Lonni BIRCH Verlin, MD;  Location: [facility] CATH LAB;  Service: Cardiovascular;  Laterality: N/A;   LUMBAR LAMINECTOMY/DECOMPRESSION MICRODISCECTOMY Right 06/17/2013   Procedure: LUMBAR LAMINECTOMY MICRODISCECTOMY L4-L5 RIGHT EXCISION OF SYNOVIAL CYST RIGHT   (1 LEVEL) RIGHT PARTIAL FACETECTOMY;  Surgeon: Tanda DELENA Heading, MD;  Location: [facility] ORS;  Service: Orthopedics;  Laterality: Right;   MYELOGRAM  04/06/2013   lumbar, Dr Heading   ORBITAL LESION EXCISION Right 09/01/2018   Procedure: ORBITOTOMY WITHOUT BONE FLAP WITH REMOVAL OF LESION  RIGHT;  Surgeon: Ashley Greig HERO, MD;  Location: [facility];  Service: Ophthalmology;  Laterality: Right;   PACEMAKER IMPLANT N/A 09/10/2017   Procedure: PACEMAKER IMPLANT;  Surgeon: Fernande Elspeth BROCKS, MD;  Location: [facility] INV[facility] LAB;  Service: Cardiovascular;  Laterality: N/A;   PANENDOSCOPY     PERIPHERAL VASCULAR BALLOON ANGIOPLASTY Left 03/16/2021   Procedure: PERIPHERAL VASCULAR BALLOON ANGIOPLASTY;  Surgeon: Magda Debby SAILOR, MD;  Location: [facility] INVASIVE CV LAB;  Service: Cardiovascular;  Laterality: Left;  subclavian  vein   PORTACATH PLACEMENT N/A 03/06/2017   Procedure: INSERTION PORT-A-CATH AND ASPIRATE SEROMA LEFT AXILLA;  Surgeon: Gail Favorite, MD;  Location: [facility] OR;  Service: General;  Laterality: N/A;   RIGHT/LEFT HEART CATH AND CORONARY/GRAFT ANGIOGRAPHY N/A 07/28/2017   Procedure: RIGHT/LEFT HEART CATH AND CORONARY/GRAFT ANGIOGRAPHY;  Surgeon: Wonda Sharper, MD;  Location: [facility] INVASIVE CV LAB;  Service: Cardiovascular;  Laterality: N/A;   SHOULDER SURGERY Right 08/30/2010   screws placed; tendons tore off   SKIN CANCER EXCISION Right    neck   TONSILLECTOMY  ~ 1954   TOTAL KNEE ARTHROPLASTY Left 06/29/2019   Procedure: TOTAL KNEE ARTHROPLASTY;  Surgeon: Ernie Cough, MD;  Location: [facility] ORS;  Service: Orthopedics;  Laterality: Left;  70 mins   TRANSCATHETER AORTIC VALVE REPLACEMENT, TRANSFEMORAL N/A 09/09/2017   Procedure: TRANSCATHETER AORTIC VALVE REPLACEMENT, TRANSFEMORAL;  Surgeon: Verlin Lonni BIRCH, MD;  Location: [facility] OR;  Service: Open Heart Surgery;  Laterality: N/A;   UPPER EXTREMITY VENOGRAPHY Left 02/15/2021   Procedure: CENTRAL VENO;  Surgeon: Magda Debby SAILOR, MD;  Location: [facility] INVASIVE CV LAB;  Service: Cardiovascular;  Laterality: Left;   UPPER EXTREMITY VENOGRAPHY N/A 03/16/2021   Procedure: UPPER EXTREMITY VENOGRAPHY;  Surgeon: Magda Debby SAILOR, MD;  Location: [facility] INVASIVE CV LAB;  Service: Cardiovascular;  Laterality: N/A;   UPPER GI ENDOSCOPY   07/02/2011   Gastritis; Dr Avram   VASECTOMY     wireless pacemaker placed      Family History  Problem Relation Age of Onset   Stroke Father    Hypertension Father    Pancreatic cancer Mother    Diabetes Maternal Grandmother    Stroke Maternal Grandmother    Heart attack Paternal Grandmother    Colon cancer Neg Hx    Esophageal cancer Neg Hx    Rectal cancer Neg Hx    Stomach cancer Neg Hx    Ulcers Neg Hx     Social History   Socioeconomic History   Marital status: Divorced    Spouse name: Not on file   Number of children: 2   Years of education: college   Highest education level: Not on file  Occupational History    Employer: RETIRED  Tobacco Use   Smoking status: Never   Smokeless tobacco: Never  Vaping Use   Vaping status: Never Used  Substance and Sexual Activity   Alcohol use: No    Alcohol/week: 0.0 standard drinks of alcohol    Comment: last drink was in 2012( 03/15/2014)   Drug use: No   Sexual activity: Not Currently  Other Topics Concern   Not on file  Social History Narrative   ** Merged History Encounter **       Divorced, lives with a roommate. 1 son one daughter 3-4 caffeinated beverages daily Right-handed. 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   Social Drivers of Corporate investment banker Strain: Low Risk  (09/30/2023)   Overall Financial Resource Strain (CARDIA)    Difficulty of Paying Living Expenses: Not hard at all  Food Insecurity: Low Risk  (10/21/2023)   Received from [facility]   Hunger Vital Sign    Within the past 12 months, you worried that your food would run out before you got money to buy more: Never true    Within the past 12 months, the food you bought just didn't last and you didn't have money to get more. : Never true  Transportation Needs: No Transportation Needs (10/21/2023)   Received  from [facility]    In the past 12 months, has lack of reliable transportation kept you from medical appointments, meetings, work or from getting things needed for daily  living? : No  Physical Activity: Sufficiently Active (09/30/2023)   Exercise Vital Sign    Days of Exercise per Week: 3 days    Minutes of Exercise per Session: 150+ min  Stress: No Stress Concern Present (09/30/2023)   [employer] of Occupational Health - Occupational Stress Questionnaire    Feeling of Stress : Not at all  Social Connections: Moderately Integrated (09/30/2023)   Social Connection and Isolation Panel    Frequency of Communication with Friends and Family: More than three times a week    Frequency of Social Gatherings with Friends and Family: More than three times a week    Attends Religious Services: More than 4 times per year    Active Member of [employer] or Organizations: Yes    Attends Engineer, structural: More than 4 times per year    Marital Status: Divorced  Intimate Partner Violence: Not At Risk (09/30/2023)   Humiliation, Afraid, Rape, and Kick questionnaire    Fear of Current or Ex-Partner: No    Emotionally Abused: No    Physically Abused: No    Sexually Abused: No    Outpatient Medications Prior to Visit  Medication Sig Dispense Refill   acetaminophen  (TYLENOL ) 325 MG tablet Take 650 mg by mouth at bedtime as needed for moderate pain (pain score 4-6) or headache.     allopurinol  (ZYLOPRIM ) 300 MG tablet Take 450 mg by mouth daily.     aluminum hydroxide-magnesium  carbonate (GAVISCON) 95-358 MG/15ML SUSP Take 15 mLs by mouth as needed for indigestion or heartburn.     amoxicillin  (AMOXIL ) 500 MG capsule Take 500 mg by mouth 2 (two) times daily.     apixaban  (ELIQUIS ) 2.5 MG TABS tablet TAKE 1 TABLET BY MOUTH TWICE A DAY 180 tablet 1   Ascorbic Acid  (VITAMIN C  PO) Take 1 tablet by mouth daily with breakfast.     atorvastatin  (LIPITOR ) 80 MG tablet TAKE 1 TABLET BY  MOUTH EVERYDAY AT BEDTIME 90 tablet 3   Azelastine  HCl 137 MCG/SPRAY SOLN PLACE 2 SPRAYS INTO BOTH NOSTRILS AT BEDTIME AS NEEDED FOR RHINITIS OR ALLERGIES. 90 mL 1   Coenzyme Q10 (CO Q-10 PO) Take 100 mg by mouth daily.     esomeprazole  (NEXIUM ) 40 MG capsule Take 1 capsule (40 mg total) by mouth daily. 30 capsule 5   ezetimibe  (ZETIA ) 10 MG tablet Take 1 tablet (10 mg total) by mouth daily. Pt needs office visit for further refills 90 tablet 1   FARXIGA  10 MG TABS tablet Take 10 mg by mouth in the morning.     fenofibrate  160 MG tablet TAKE 1 TABLET BY MOUTH EVERY DAY 90 tablet 1   folic acid  (FOLVITE ) 1 MG tablet TAKE 2 TABLETS BY MOUTH EVERY DAY 180 tablet 4   FREESTYLE LITE test strip USE TO TEST BLOOD SUGAR ONCE A DAY. DX CODE: E11.9 100 strip 12   gabapentin  (NEURONTIN ) 100 MG capsule Take 100 mg by mouth at bedtime. PRN     glimepiride  (AMARYL ) 2 MG tablet TAKE 1 TABLET (2 MG TOTAL) BY MOUTH IN THE MORNING AND AT BEDTIME. 180 tablet 1   KLOR-CON  M20 20 MEQ tablet TAKE 2 TABLETS BY MOUTH EVERY DAY 180 tablet 0   Lancets (FREESTYLE) lancets USE ONCE A DAY TO CHECK BLOOD SUGAR. 100 each 12   Menthol -Camphor (ICY HOT ADVANCED PAIN RELIEF EX) Apply 1 application  topically daily as needed (to painful sites).     metoprolol  succinate (TOPROL -XL) 25 MG  24 hr tablet TAKE 1 TABLET (25 MG TOTAL) BY MOUTH DAILY. 90 tablet 3   nitroGLYCERIN  (NITROSTAT ) 0.4 MG SL tablet PLACE 1 TABLET UNDER THE TONGUE EVERY 5 MINUTES AS NEEDED FOR CHEST PAIN. 25 tablet 6   pregabalin  (LYRICA ) 100 MG capsule TAKE 1 CAPSULE (100 MG TOTAL) BY MOUTH DAILY. AT NOON 90 capsule 1   pregabalin  (LYRICA ) 150 MG capsule TAKE 1 CAPSULE (150 MG TOTAL) BY MOUTH AT BEDTIME. (TAKE 100MG  DAILY AT NOON) 90 capsule 1   psyllium (METAMUCIL) 58.6 % powder Take 1 packet by mouth daily as needed (for constipation- mix and drink).     torsemide  (DEMADEX ) 20 MG tablet TAKE 2 TAB BY MOUTH IN MORNING TAKE EXTRA TABLET FOR WEIGHT GAIN MORE THAN  2-3LBS IN 24HR FOR 90DAY 205 tablet 0   VASCEPA  1 g capsule TAKE 2 CAPSULES BY MOUTH 2 TIMES DAILY. 360 capsule 1   Dulaglutide  (TRULICITY ) 3 MG/0.5ML SOAJ INJECT 3 MG INTO THE SKIN ONE TIME PER WEEK 2 mL 2   No facility-administered medications prior to visit.    Allergies  Allergen Reactions   Ace Inhibitors Swelling and Other (See Comments)    Angioedema    Benazepril Swelling and Other (See Comments)    Angioedema; he is not a candidate for any angiotensin receptor blockers because of this significant allergic reaction. Because of a history of documented adverse serious drug reaction;Medi Alert bracelet  is recommended   Entresto  [Sacubitril -Valsartan ] Swelling and Other (See Comments)    First-in-Class Angiotensin Receptor Neprilysin Inhibitor- Med was red-flagged by the patient's pharmacy for him to NOT take!   Hctz [Hydrochlorothiazide] Anaphylaxis and Swelling    Tongue and lip swelling    Aspirin  Other (See Comments)    Gastritis, can aspirin  not take 325 mg aspirin     ROS    See HPI Objective:    Physical Exam  General: No acute distress. Awake and conversant.  Eyes: Normal conjunctiva, anicteric. Round symmetric pupils.  ENT: Hearing grossly intact. No nasal discharge.  Neck: Neck is supple. No masses or thyromegaly.  Respiratory: CTAB. Respirations are non-labored. No wheezing.  Skin: Warm. Dry Psych: Alert and oriented. Cooperative, Appropriate mood and affect, Normal judgment.  CV: RRR. MSK: Normal ambulation. No clubbing or cyanosis.  Neuro:  CN II-XII grossly normal.    EKG interpretation: Rate: 69 Rhythm: V paced No ST/T changes concerning for acute ischemia/infarct   EKG performed to evaluate dizziness. I have personally seen and reviewed the EKG, which is consistent with prior tracings. No acute concerns. Harlene LITTIE Jolly, NP  12/26/23       BP 120/70   Pulse 63   Temp 97.6 F (36.4 C)   Ht 6' (1.829 m)   Wt 236 lb (107 kg)   SpO2 98%    BMI 32.01 kg/m  Wt Readings from Last 3 Encounters:  12/26/23 236 lb (107 kg)  12/19/23 235 lb (106.6 kg)  12/16/23 234 lb 6.4 oz (106.3 kg)       Assessment & Plan:   Problem List Items Addressed This Visit     Dizziness - Primary   Dizziness and lightheadedness, likely related to uncontrolled blood sugars, and dehydration. -Rx-increased Dulaglutide  (Trulicity ) to 4.5 mg weekly. -EKG assistant with prior tracings -UA negative - Encourage consistent hydration and protein intake. - Monitor symptoms and report any escalation.         Relevant Orders   EKG 12-Lead (Completed)   POCT urinalysis dipstick (Completed)  DM2 (diabetes mellitus, type 2) (HCC)   Fasting  AM BS between 170-190.  Increased Trulicity . Minimize simple carbs. Increase exercise as tolerated.       Relevant Medications   Dulaglutide  (TRULICITY ) 4.5 MG/0.5ML SOAJ   Primary hypertension   Well controlled, no changes to meds. Encouraged heart healthy diet such as the DASH diet and exercise as tolerated.        Uncontrolled type 2 diabetes mellitus with hyperglycemia (HCC)   Relevant Medications   Dulaglutide  (TRULICITY ) 4.5 MG/0.5ML SOAJ   Other Relevant Orders   POCT Glucose (Device for Home Use) (Completed)   Follow-up with PCP 4 weeks.  Portions of this note were dictated using DRAGON voice recognition software. Please disregard any errors in transcription.    Patient was educated on the diagnosis, treatment options, potential risks, benefits, and alternatives. All questions were addressed. Patient verbalized understanding and agrees with the plan of care. Will follow up as advised or sooner if symptoms worsen or new concerns arise.   I have discontinued Dempsey L. Casaus's Trulicity . I am also having him start on Trulicity . Additionally, I am having him maintain his allopurinol , esomeprazole , psyllium, aluminum hydroxide-magnesium  carbonate, acetaminophen , Menthol -Camphor (ICY HOT ADVANCED PAIN  RELIEF EX), Coenzyme Q10 (CO Q-10 PO), Ascorbic Acid  (VITAMIN C  PO), Farxiga , freestyle, metoprolol  succinate, gabapentin , amoxicillin , pregabalin , pregabalin , fenofibrate , glimepiride , Azelastine  HCl, FREESTYLE LITE, Eliquis , nitroGLYCERIN , Vascepa , atorvastatin , folic acid , Klor-Con  M20, torsemide , and ezetimibe .  Meds ordered this encounter  Medications   Dulaglutide  (TRULICITY ) 4.5 MG/0.5ML SOAJ    Sig: Inject 4.5 mg into the skin once a week.    Dispense:  6 mL    Refill:  0    Supervising Provider:   DOMENICA BLACKBIRD A [[id number]]

## 2023-12-26 ENCOUNTER — Encounter: Payer: Self-pay | Admitting: Student

## 2023-12-26 ENCOUNTER — Ambulatory Visit (INDEPENDENT_AMBULATORY_CARE_PROVIDER_SITE_OTHER): Admitting: Student

## 2023-12-26 VITALS — BP 120/70 | HR 63 | Temp 97.6°F | Ht 72.0 in | Wt 236.0 lb

## 2023-12-26 DIAGNOSIS — E1122 Type 2 diabetes mellitus with diabetic chronic kidney disease: Secondary | ICD-10-CM | POA: Diagnosis not present

## 2023-12-26 DIAGNOSIS — N183 Chronic kidney disease, stage 3 unspecified: Secondary | ICD-10-CM | POA: Diagnosis not present

## 2023-12-26 DIAGNOSIS — E1165 Type 2 diabetes mellitus with hyperglycemia: Secondary | ICD-10-CM

## 2023-12-26 DIAGNOSIS — Z7985 Long-term (current) use of injectable non-insulin antidiabetic drugs: Secondary | ICD-10-CM

## 2023-12-26 DIAGNOSIS — I10 Essential (primary) hypertension: Secondary | ICD-10-CM | POA: Insufficient documentation

## 2023-12-26 DIAGNOSIS — R42 Dizziness and giddiness: Secondary | ICD-10-CM | POA: Diagnosis not present

## 2023-12-26 LAB — POCT URINALYSIS DIPSTICK
Bilirubin, UA: NEGATIVE
Blood, UA: NEGATIVE
Glucose, UA: POSITIVE — AB
Ketones, UA: NEGATIVE
Leukocytes, UA: NEGATIVE
Nitrite, UA: NEGATIVE
Protein, UA: NEGATIVE
Spec Grav, UA: <=1.005 — AB (ref 1.010–1.025)
Urobilinogen, UA: 0.2 U/dL
pH, UA: 6 (ref 5.0–8.0)

## 2023-12-26 LAB — POCT GLUCOSE (DEVICE FOR HOME USE): POC Glucose: 171 mg/dL — AB (ref 70–99)

## 2023-12-26 MED ORDER — TRULICITY 4.5 MG/0.5ML ~~LOC~~ SOAJ
4.5000 mg | SUBCUTANEOUS | 0 refills | Status: DC
Start: 2023-12-26 — End: 2024-03-02

## 2023-12-26 NOTE — Assessment & Plan Note (Addendum)
 Dizziness and lightheadedness, likely related to uncontrolled blood sugars, and dehydration. -Rx-increased Dulaglutide  (Trulicity ) to 4.5 mg weekly. -EKG assistant with prior tracings -UA negative - Encourage consistent hydration and protein intake. - Monitor symptoms and report any escalation.

## 2023-12-26 NOTE — Assessment & Plan Note (Signed)
 Well controlled, no changes to meds. Encouraged heart healthy diet such as the DASH diet and exercise as tolerated.

## 2023-12-26 NOTE — Assessment & Plan Note (Addendum)
 Fasting  AM BS between 170-190. +glucose in UA.  Increased Trulicity . Minimize simple carbs. Increase exercise as tolerated. Consider Endocrinology referral.

## 2023-12-28 ENCOUNTER — Ambulatory Visit: Payer: Self-pay | Admitting: Family Medicine

## 2023-12-28 ENCOUNTER — Other Ambulatory Visit: Payer: Self-pay | Admitting: Family Medicine

## 2023-12-28 DIAGNOSIS — E1122 Type 2 diabetes mellitus with diabetic chronic kidney disease: Secondary | ICD-10-CM

## 2023-12-28 DIAGNOSIS — E1169 Type 2 diabetes mellitus with other specified complication: Secondary | ICD-10-CM

## 2023-12-28 DIAGNOSIS — I10 Essential (primary) hypertension: Secondary | ICD-10-CM

## 2023-12-29 ENCOUNTER — Ambulatory Visit: Payer: Self-pay | Admitting: Dermatology

## 2023-12-29 ENCOUNTER — Encounter: Payer: Self-pay | Admitting: Dermatology

## 2023-12-29 NOTE — Telephone Encounter (Signed)
 Left pt msg to call for bx results/sh

## 2023-12-29 NOTE — Telephone Encounter (Signed)
-----   Message from Rexene Rattler sent at 12/29/2023  8:34 AM EDT ----- 1. Skin, right mid forearm thenar :       MODERATELY DIFFERENTIATED SQUAMOUS CELL CARCINOMA  2. Skin, right thenar hand - dorsum :       WELL DIFFERENTIATED SQUAMOUS CELL CARCINOMA WITH SUPERFICIAL INFILTRATION  3. Skin, right upper pretibial :       WELL DIFFERENTIATED SQUAMOUS CELL CARCINOMA   SCC skin cancers- all three already treated with EDC at time of biopsy  - please call patient ----- Message ----- From: Interface, Lab In [facility] Sent: 12/25/2023   6:02 PM EDT To: Rexene Rattler, MD

## 2023-12-29 NOTE — Telephone Encounter (Signed)
 Discussed biopsy results with patient

## 2024-01-06 ENCOUNTER — Inpatient Hospital Stay: Admitting: Family

## 2024-01-06 ENCOUNTER — Inpatient Hospital Stay

## 2024-01-06 DIAGNOSIS — I442 Atrioventricular block, complete: Secondary | ICD-10-CM | POA: Diagnosis not present

## 2024-01-09 ENCOUNTER — Encounter: Payer: Self-pay | Admitting: Family

## 2024-01-09 ENCOUNTER — Inpatient Hospital Stay: Attending: Hematology & Oncology

## 2024-01-09 ENCOUNTER — Inpatient Hospital Stay

## 2024-01-09 ENCOUNTER — Inpatient Hospital Stay (HOSPITAL_BASED_OUTPATIENT_CLINIC_OR_DEPARTMENT_OTHER): Admitting: Family

## 2024-01-09 VITALS — BP 117/59 | HR 79 | Temp 98.8°F | Resp 16 | Wt 235.0 lb

## 2024-01-09 DIAGNOSIS — D508 Other iron deficiency anemias: Secondary | ICD-10-CM

## 2024-01-09 DIAGNOSIS — D519 Vitamin B12 deficiency anemia, unspecified: Secondary | ICD-10-CM | POA: Diagnosis not present

## 2024-01-09 DIAGNOSIS — E611 Iron deficiency: Secondary | ICD-10-CM | POA: Diagnosis not present

## 2024-01-09 DIAGNOSIS — Z950 Presence of cardiac pacemaker: Secondary | ICD-10-CM | POA: Diagnosis not present

## 2024-01-09 DIAGNOSIS — D509 Iron deficiency anemia, unspecified: Secondary | ICD-10-CM | POA: Diagnosis not present

## 2024-01-09 DIAGNOSIS — Z79899 Other long term (current) drug therapy: Secondary | ICD-10-CM | POA: Diagnosis not present

## 2024-01-09 DIAGNOSIS — D510 Vitamin B12 deficiency anemia due to intrinsic factor deficiency: Secondary | ICD-10-CM | POA: Diagnosis not present

## 2024-01-09 DIAGNOSIS — C8332 Diffuse large B-cell lymphoma, intrathoracic lymph nodes: Secondary | ICD-10-CM | POA: Insufficient documentation

## 2024-01-09 LAB — CBC WITH DIFFERENTIAL (CANCER CENTER ONLY)
Abs Immature Granulocytes: 0.04 K/uL (ref 0.00–0.07)
Basophils Absolute: 0.1 K/uL (ref 0.0–0.1)
Basophils Relative: 1 %
Eosinophils Absolute: 0.4 K/uL (ref 0.0–0.5)
Eosinophils Relative: 5 %
HCT: 43.5 % (ref 39.0–52.0)
Hemoglobin: 14.2 g/dL (ref 13.0–17.0)
Immature Granulocytes: 1 %
Lymphocytes Relative: 22 %
Lymphs Abs: 1.6 K/uL (ref 0.7–4.0)
MCH: 29.3 pg (ref 26.0–34.0)
MCHC: 32.6 g/dL (ref 30.0–36.0)
MCV: 89.7 fL (ref 80.0–100.0)
Monocytes Absolute: 0.5 K/uL (ref 0.1–1.0)
Monocytes Relative: 7 %
Neutro Abs: 4.8 K/uL (ref 1.7–7.7)
Neutrophils Relative %: 64 %
Platelet Count: 212 K/uL (ref 150–400)
RBC: 4.85 MIL/uL (ref 4.22–5.81)
RDW: 16.1 % — ABNORMAL HIGH (ref 11.5–15.5)
WBC Count: 7.4 K/uL (ref 4.0–10.5)
nRBC: 0 % (ref 0.0–0.2)

## 2024-01-09 LAB — CMP (CANCER CENTER ONLY)
ALT: 13 U/L (ref 0–44)
AST: 19 U/L (ref 15–41)
Albumin: 4.5 g/dL (ref 3.5–5.0)
Alkaline Phosphatase: 65 U/L (ref 38–126)
Anion gap: 10 (ref 5–15)
BUN: 34 mg/dL — ABNORMAL HIGH (ref 8–23)
CO2: 30 mmol/L (ref 22–32)
Calcium: 9.9 mg/dL (ref 8.9–10.3)
Chloride: 100 mmol/L (ref 98–111)
Creatinine: 1.95 mg/dL — ABNORMAL HIGH (ref 0.61–1.24)
GFR, Estimated: 33 mL/min — ABNORMAL LOW (ref >60)
Glucose, Bld: 158 mg/dL — ABNORMAL HIGH (ref 70–99)
Potassium: 4.8 mmol/L (ref 3.5–5.1)
Sodium: 140 mmol/L (ref 135–145)
Total Bilirubin: 0.6 mg/dL (ref 0.0–1.2)
Total Protein: 7.5 g/dL (ref 6.5–8.1)

## 2024-01-09 LAB — VITAMIN B12: Vitamin B-12: 266 pg/mL (ref 180–914)

## 2024-01-09 LAB — FERRITIN: Ferritin: 2207 ng/mL — ABNORMAL HIGH (ref 24–336)

## 2024-01-09 LAB — IRON AND IRON BINDING CAPACITY (CC-WL,HP ONLY)
Iron: 72 ug/dL (ref 45–182)
Saturation Ratios: 18 % (ref 17.9–39.5)
TIBC: 395 ug/dL (ref 250–450)
UIBC: 323 ug/dL

## 2024-01-09 MED ORDER — CYANOCOBALAMIN 1000 MCG/ML IJ SOLN
1000.0000 ug | Freq: Once | INTRAMUSCULAR | Status: AC
  Administered 2024-01-09: 1000 ug via INTRAMUSCULAR
  Filled 2024-01-09: qty 1

## 2024-01-09 NOTE — Patient Instructions (Signed)
Vitamin B12 Deficiency Vitamin B12 deficiency occurs when the body does not have enough of this important vitamin. The body needs this vitamin: To make red blood cells. To make DNA. This is the genetic material inside cells. To help the nerves work properly so they can carry messages from the brain to the body. Vitamin B12 deficiency can cause health problems, such as not having enough red blood cells in the blood (anemia). This can lead to nerve damage if untreated. What are the causes? This condition may be caused by: Not eating enough foods that contain vitamin B12. Not having enough stomach acid and digestive fluids to properly absorb vitamin B12 from the food that you eat. Having certain diseases that make it hard to absorb vitamin B12. These diseases include Crohn's disease, chronic pancreatitis, and cystic fibrosis. An autoimmune disorder in which the body does not make enough of a protein (intrinsic factor) within the stomach, resulting in not enough absorption of vitamin B12. Having a surgery in which part of the stomach or small intestine is removed. Taking certain medicines that make it hard for the body to absorb vitamin B12. These include: Heartburn medicines, such as antacids and proton pump inhibitors. Some medicines that are used to treat diabetes. What increases the risk? The following factors may make you more likely to develop a vitamin B12 deficiency: Being an older adult. Eating a vegetarian or vegan diet that does not include any foods that come from animals. Eating a poor diet while you are pregnant. Taking certain medicines. Having alcoholism. What are the signs or symptoms? In some cases, there are no symptoms of this condition. If the condition leads to anemia or nerve damage, various symptoms may occur, such as: Weakness. Tiredness (fatigue). Loss of appetite. Numbness or tingling in your hands and feet. Redness and burning of the tongue. Depression,  confusion, or memory problems. Trouble walking. If anemia is severe, symptoms can include: Shortness of breath. Dizziness. Rapid heart rate. How is this diagnosed? This condition may be diagnosed with a blood test to measure the level of vitamin B12 in your blood. You may also have other tests, including: A group of tests that measure certain characteristics of blood cells (complete blood count, CBC). A blood test to measure intrinsic factor. A procedure where a thin tube with a camera on the end is used to look into your stomach or intestines (endoscopy). Other tests may be needed to discover the cause of the deficiency. How is this treated? Treatment for this condition depends on the cause. This condition may be treated by: Changing your eating and drinking habits, such as: Eating more foods that contain vitamin B12. Drinking less alcohol or no alcohol. Getting vitamin B12 injections. Taking vitamin B12 supplements by mouth (orally). Your health care provider will tell you which dose is best for you. Follow these instructions at home: Eating and drinking  Include foods in your diet that come from animals and contain a lot of vitamin B12. These include: Meats and poultry. This includes beef, pork, chicken, [place], and organ meats, such as liver. Seafood. This includes clams, rainbow trout, salmon, tuna, and haddock. Eggs. Dairy foods such as milk, yogurt, and cheese. Eat foods that have vitamin B12 added to them (are fortified), such as ready-to-eat breakfast cereals. Check the label on the package to see if a food is fortified. The items listed above may not be a complete list of foods and beverages you can eat and drink. Contact a dietitian for  more information. Alcohol use Do not drink alcohol if: Your health care provider tells you not to drink. You are pregnant, may be pregnant, or are planning to become pregnant. If you drink alcohol: Limit how much you have to: 0-1 drink a  day for women. 0-2 drinks a day for men. Know how much alcohol is in your drink. In the [place] one drink equals one 12 oz bottle of beer (355 mL), one 5 oz glass of wine (148 mL), or one 1 oz glass of hard liquor (44 mL). General instructions Get vitamin B12 injections if told to by your health care provider. Take supplements only as told by your health care provider. Follow the directions carefully. Keep all follow-up visits. This is important. Contact a health care provider if: Your symptoms come back. Your symptoms get worse or do not improve with treatment. Get help right away: You develop shortness of breath. You have a rapid heart rate. You have chest pain. You become dizzy or you faint. These symptoms may be an emergency. Get help right away. Call 911. Do not wait to see if the symptoms will go away. Do not drive yourself to the hospital. Summary Vitamin B12 deficiency occurs when the body does not have enough of this important vitamin. Common causes include not eating enough foods that contain vitamin B12, not being able to absorb vitamin B12 from the food that you eat, having a surgery in which part of the stomach or small intestine is removed, or taking certain medicines. Eat foods that have vitamin B12 in them. Treatment may include making a change in the way you eat and drink, getting vitamin B12 injections, or taking vitamin B12 supplements. This information is not intended to replace advice given to you by your health care provider. Make sure you discuss any questions you have with your health care provider. Document Revised: 02/09/2021 Document Reviewed: 02/09/2021 Elsevier Patient Education  2024 Else[employer]. Denosumab Injection (Oncology) What is this medication? DENOSUMAB (den oh SUE mab) prevents weakened bones caused by cancer. It may also be used to treat noncancerous bone tumors that cannot be removed by surgery. It can also be used to treat high calcium levels in  the blood caused by cancer. It works by blocking a protein that causes bones to break down quickly. This slows down the release of calcium from bones, which lowers calcium levels in your blood. It also makes your bones stronger and less likely to break (fracture). This medicine may be used for other purposes; ask your health care provider or pharmacist if you have questions. COMMON BRAND NAME(S): XGEVA What should I tell my care team before I take this medication? They need to know if you have any of these conditions: Dental disease Having surgery or tooth extraction Infection Kidney disease Low levels of calcium or vitamin D in the blood Malnutrition On hemodialysis Skin conditions or sensitivity Thyroid or parathyroid disease An unusual reaction to denosumab, other medications, foods, dyes, or preservatives Pregnant or trying to get pregnant Breast-feeding How should I use this medication? This medication is for injection under the skin. It is given by your care team in a hospital or clinic setting. A special MedGuide will be given to you before each treatment. Be sure to read this information carefully each time. Talk to your care team about the use of this medication in children. While it may be prescribed for children as young as 13 years for selected conditions, precautions do apply. Overdosage: If  you think you have taken too much of this medicine contact a poison control center or emergency room at once. NOTE: This medicine is only for you. Do not share this medicine with others. What if I miss a dose? Keep appointments for follow-up doses. It is important not to miss your dose. Call your care team if you are unable to keep an appointment. What may interact with this medication? Do not take this medication with any of the following: Other medications containing denosumab This medication may also interact with the following: Medications that lower your chance of fighting  infection Steroid medications, such as prednisone or cortisone This list may not describe all possible interactions. Give your health care provider a list of all the medicines, herbs, non-prescription drugs, or dietary supplements you use. Also tell them if you smoke, drink alcohol, or use illegal drugs. Some items may interact with your medicine. What should I watch for while using this medication? Your condition will be monitored carefully while you are receiving this medication. You may need blood work while taking this medication. This medication may increase your risk of getting an infection. Call your care team for advice if you get a fever, chills, sore throat, or other symptoms of a cold or flu. Do not treat yourself. Try to avoid being around people who are sick. You should make sure you get enough calcium and vitamin D while you are taking this medication, unless your care team tells you not to. Discuss the foods you eat and the vitamins you take with your care team. Some people who take this medication have severe bone, joint, or muscle pain. This medication may also increase your risk for jaw problems or a broken thigh bone. Tell your care team right away if you have severe pain in your jaw, bones, joints, or muscles. Tell your care team if you have any pain that does not go away or that gets worse. Talk to your care team if you may be pregnant. Serious birth defects can occur if you take this medication during pregnancy and for 5 months after the last dose. You will need a negative pregnancy test before starting this medication. Contraception is recommended while taking this medication and for 5 months after the last dose. Your care team can help you find the option that works for you. What side effects may I notice from receiving this medication? Side effects that you should report to your care team as soon as possible: Allergic reactions--skin rash, itching, hives, swelling of the face,  lips, tongue, or throat Bone, joint, or muscle pain Low calcium level--muscle pain or cramps, confusion, tingling, or numbness in the hands or feet Osteonecrosis of the jaw--pain, swelling, or redness in the mouth, numbness of the jaw, poor healing after dental work, unusual discharge from the mouth, visible bones in the mouth Side effects that usually do not require medical attention (report to your care team if they continue or are bothersome): Cough Diarrhea Fatigue Headache Nausea This list may not describe all possible side effects. Call your doctor for medical advice about side effects. You may report side effects to FDA at 1-800-FDA-1088. Where should I keep my medication? This medication is given in a hospital or clinic. It will not be stored at home. NOTE: This sheet is a summary. It may not cover all possible information. If you have questions about this medicine, talk to your doctor, pharmacist, or health care provider.  2024 Elsevier/Gold Standard (2021-11-07 00:00:00)

## 2024-01-09 NOTE — Progress Notes (Signed)
 Hematology and Oncology Follow Up Visit  [name] [id number] 07-05-1940 83 y.o. 01/09/2024   Principle Diagnosis:  Diffuse large cell non-Hodgkin's lymphoma (IPI = 3) - NOT double hit Pernicious anemia Iron deficiency secondary to bleeding   Past Therapy: R-CHOP - s/p cycle 8 - completed 08/2017   Current Therapy:        Vitamin B12 1 mg IM every month Xgeva  120 mg subcu q 3 months - next dose due in 01/2024 IV iron as indicated   Interim History:  Richard [name] is here today for follow-up and B 12. He is doing well and has no complaints at this time. He only got to play golf once this week because of all the rain.  No fever, chills, n/v, cough, rash, dizziness, SOB, chest pain, palpitations, abdominal pain or changes in bowel or bladder habits.  No swelling or tenderness in his extremities.  Neuropathy in the feet is unchanged from baseline.  No falls or syncope reported.  Appetite and hydration are good. Weight is stable at 235 lbs.   ECOG Performance Status: 1 - Symptomatic but completely ambulatory  Medications:  Allergies as of 01/09/2024       Reactions   Ace Inhibitors Swelling, Other (See Comments)   Angioedema   Benazepril Swelling, Other (See Comments)   Angioedema; he is not a candidate for any angiotensin receptor blockers because of this significant allergic reaction. Because of a history of documented adverse serious drug reaction;Medi Alert bracelet  is recommended   Entresto  [sacubitril -valsartan ] Swelling, Other (See Comments)   First-in-Class Angiotensin Receptor Neprilysin Inhibitor- Med was red-flagged by the patient's pharmacy for him to NOT take!   Hctz [hydrochlorothiazide] Anaphylaxis, Swelling   Tongue and lip swelling   Aspirin  Other (See Comments)   Gastritis, can aspirin  not take 325 mg aspirin         Medication List        Accurate as of January 09, 2024  1:57 PM. If you have any questions, ask your nurse or doctor.           acetaminophen  325 MG tablet Commonly known as: TYLENOL  Take 650 mg by mouth at bedtime as needed for moderate pain (pain score 4-6) or headache.   allopurinol  300 MG tablet Commonly known as: ZYLOPRIM  Take 450 mg by mouth daily.   aluminum hydroxide-magnesium  carbonate 95-358 MG/15ML Susp Commonly known as: GAVISCON Take 15 mLs by mouth as needed for indigestion or heartburn.   amoxicillin  500 MG capsule Commonly known as: AMOXIL  Take 500 mg by mouth 2 (two) times daily.   atorvastatin  80 MG tablet Commonly known as: LIPITOR  TAKE 1 TABLET BY MOUTH EVERYDAY AT BEDTIME   Azelastine  HCl 137 MCG/SPRAY Soln PLACE 2 SPRAYS INTO BOTH NOSTRILS AT BEDTIME AS NEEDED FOR RHINITIS OR ALLERGIES.   CO Q-10 PO Take 100 mg by mouth daily.   Eliquis  2.5 MG Tabs tablet Generic drug: apixaban  TAKE 1 TABLET BY MOUTH TWICE A DAY   esomeprazole  40 MG capsule Commonly known as: NexIUM  Take 1 capsule (40 mg total) by mouth daily.   ezetimibe  10 MG tablet Commonly known as: ZETIA  Take 1 tablet (10 mg total) by mouth daily. Pt needs office visit for further refills   Farxiga  10 MG Tabs tablet Generic drug: dapagliflozin  propanediol Take 10 mg by mouth in the morning.   fenofibrate  160 MG tablet TAKE 1 TABLET BY MOUTH EVERY DAY   folic acid  1 MG tablet Commonly known as: FOLVITE  TAKE 2  TABLETS BY MOUTH EVERY DAY   freestyle lancets USE ONCE A DAY TO CHECK BLOOD SUGAR.   FREESTYLE LITE test strip Generic drug: glucose blood USE TO TEST BLOOD SUGAR ONCE A DAY. DX CODE: E11.9   gabapentin  100 MG capsule Commonly known as: NEURONTIN  Take 100 mg by mouth at bedtime. PRN   glimepiride  2 MG tablet Commonly known as: AMARYL  TAKE 1 TABLET (2 MG TOTAL) BY MOUTH IN THE MORNING AND AT BEDTIME.   ICY HOT ADVANCED PAIN RELIEF EX Apply 1 application  topically daily as needed (to painful sites).   Klor-Con  M20 20 MEQ tablet Generic drug: potassium chloride  SA TAKE 2 TABLETS BY MOUTH  EVERY DAY   metoprolol  succinate 25 MG 24 hr tablet Commonly known as: TOPROL -XL TAKE 1 TABLET (25 MG TOTAL) BY MOUTH DAILY.   nitroGLYCERIN  0.4 MG SL tablet Commonly known as: NITROSTAT  PLACE 1 TABLET UNDER THE TONGUE EVERY 5 MINUTES AS NEEDED FOR CHEST PAIN.   pregabalin  150 MG capsule Commonly known as: LYRICA  TAKE 1 CAPSULE (150 MG TOTAL) BY MOUTH AT BEDTIME. (TAKE 100MG  DAILY AT NOON)   pregabalin  100 MG capsule Commonly known as: LYRICA  TAKE 1 CAPSULE (100 MG TOTAL) BY MOUTH DAILY. AT NOON   psyllium 58.6 % powder Commonly known as: METAMUCIL Take 1 packet by mouth daily as needed (for constipation- mix and drink).   torsemide  20 MG tablet Commonly known as: DEMADEX  TAKE 2 TAB BY MOUTH IN MORNING TAKE EXTRA TABLET FOR WEIGHT GAIN MORE THAN 2-3LBS IN 24HR FOR 90DAY   Trulicity  4.5 MG/0.5ML Soaj Generic drug: Dulaglutide  Inject 4.5 mg into the skin once a week.   Vascepa  1 g capsule Generic drug: icosapent  Ethyl TAKE 2 CAPSULES BY MOUTH 2 TIMES DAILY.   VITAMIN C  PO Take 1 tablet by mouth daily with breakfast.        Allergies:  Allergies  Allergen Reactions   Ace Inhibitors Swelling and Other (See Comments)    Angioedema    Benazepril Swelling and Other (See Comments)    Angioedema; he is not a candidate for any angiotensin receptor blockers because of this significant allergic reaction. Because of a history of documented adverse serious drug reaction;Medi Alert bracelet  is recommended   Entresto  [Sacubitril -Valsartan ] Swelling and Other (See Comments)    First-in-Class Angiotensin Receptor Neprilysin Inhibitor- Med was red-flagged by the patient's pharmacy for him to NOT take!   Hctz [Hydrochlorothiazide] Anaphylaxis and Swelling    Tongue and lip swelling    Aspirin  Other (See Comments)    Gastritis, can aspirin  not take 325 mg aspirin     Past Medical History, Surgical history, Social history, and Family History were reviewed and updated.  Review  of Systems: All other 10 point review of systems is negative.   Physical Exam:  vitals were not taken for this visit.   Wt Readings from Last 3 Encounters:  12/26/23 236 lb (107 kg)  12/19/23 235 lb (106.6 kg)  12/16/23 234 lb 6.4 oz (106.3 kg)    Ocular: Sclerae unicteric, pupils equal, round and reactive to light Ear-nose-throat: Oropharynx clear, dentition fair Lymphatic: No cervical or supraclavicular adenopathy Lungs no rales or rhonchi, good excursion bilaterally Heart regular rate and rhythm, no murmur appreciated Abd soft, nontender, positive bowel sounds MSK no focal spinal tenderness, no joint edema Neuro: non-focal, well-oriented, appropriate affect Breasts: Deferred   Lab Results  Component Value Date   WBC 8.2 12/19/2023   HGB 14.5 12/19/2023   HCT 44.5 12/19/2023  MCV 90.8 12/19/2023   PLT 218 12/19/2023   Lab Results  Component Value Date   FERRITIN 2,329 (H) 12/09/2023   IRON 90 12/09/2023   TIBC 347 12/09/2023   UIBC 257 12/09/2023   IRONPCTSAT 26 12/09/2023   Lab Results  Component Value Date   RETICCTPCT 1.7 04/07/2023   RBC 4.90 12/19/2023   RETICCTABS 52.1 11/22/2011   No results found for: JONATHAN BONG [facility] Lab Results  Component Value Date   IGA 159 03/09/2012   Lab Results  Component Value Date   ALBUMINELP 4.2 07/27/2018   MSPIKE Not Observed 07/27/2018     Chemistry      Component Value Date/Time   NA 140 12/19/2023 1443   NA 138 02/08/2022 0811   NA 144 06/27/2017 0857   K 4.0 12/19/2023 1443   K 3.9 06/27/2017 0857   CL 101 12/19/2023 1443   CL 103 06/27/2017 0857   CO2 26 12/19/2023 1443   CO2 26 06/27/2017 0857   BUN 31 (H) 12/19/2023 1443   BUN 46 (H) 02/08/2022 0811   BUN 12 06/27/2017 0857   CREATININE 2.01 (H) 12/19/2023 1443      Component Value Date/Time   CALCIUM  10.1 12/19/2023 1443   CALCIUM  9.2 06/27/2017 0857   ALKPHOS 71 12/09/2023 0928   ALKPHOS 128 (H) 06/27/2017 0857    AST 21 12/19/2023 1443   AST 18 12/09/2023 0928   ALT 19 12/19/2023 1443   ALT 13 12/09/2023 0928   ALT 24 06/27/2017 0857   BILITOT 0.6 12/19/2023 1443   BILITOT 0.6 12/09/2023 0928       Impression and Plan: Mr. Desha is a very pleasant 83 yo caucasian gentleman with diffuse large B-cell lymphoma (not double hit lymphoma). He completed treatment in February 2019.  We will proceed with B 12 injection.  Iron studies pending. We will replace if needed.  CBC and CMP routed to Dr. ALONSO Stank with nephrology and Bernice Pomfret, PA-C with ID.  Follow-up in 1 month.    Lauraine Pepper, NP 7/11/20251:57 PM

## 2024-01-12 ENCOUNTER — Other Ambulatory Visit: Payer: Self-pay | Admitting: Cardiology

## 2024-01-12 ENCOUNTER — Other Ambulatory Visit: Payer: Self-pay | Admitting: Family Medicine

## 2024-01-12 ENCOUNTER — Encounter: Payer: Self-pay | Admitting: Podiatry

## 2024-01-12 ENCOUNTER — Encounter (HOSPITAL_BASED_OUTPATIENT_CLINIC_OR_DEPARTMENT_OTHER): Payer: Self-pay | Admitting: Pulmonary Disease

## 2024-01-12 ENCOUNTER — Ambulatory Visit (INDEPENDENT_AMBULATORY_CARE_PROVIDER_SITE_OTHER)

## 2024-01-12 ENCOUNTER — Ambulatory Visit (INDEPENDENT_AMBULATORY_CARE_PROVIDER_SITE_OTHER): Admitting: Podiatry

## 2024-01-12 ENCOUNTER — Ambulatory Visit (INDEPENDENT_AMBULATORY_CARE_PROVIDER_SITE_OTHER): Admitting: Pulmonary Disease

## 2024-01-12 VITALS — BP 107/59 | HR 71 | Ht <= 58 in | Wt 235.0 lb

## 2024-01-12 DIAGNOSIS — M21622 Bunionette of left foot: Secondary | ICD-10-CM

## 2024-01-12 DIAGNOSIS — L97522 Non-pressure chronic ulcer of other part of left foot with fat layer exposed: Secondary | ICD-10-CM

## 2024-01-12 DIAGNOSIS — N183 Chronic kidney disease, stage 3 unspecified: Secondary | ICD-10-CM

## 2024-01-12 DIAGNOSIS — G4733 Obstructive sleep apnea (adult) (pediatric): Secondary | ICD-10-CM

## 2024-01-12 MED ORDER — MUPIROCIN 2 % EX OINT: 1.0000 | TOPICAL_OINTMENT | Freq: Two times a day (BID) | CUTANEOUS | 0 refills | Status: AC

## 2024-01-12 NOTE — Progress Notes (Signed)
   Subjective:    Patient ID: Richard [name], male    DOB: 09-19-1940, 83 y.o.   MRN: [id number]  HPI  83 yo man For FU of obstructive sleep apnea.   He got a new CPAP machine in 10/2015   PMH -   HFrEF - 30% diffuse large B-cell lymphoma s/p chemotherapy (ennever) , in remission severe AS s/p TAVR  & PPM CHB  -left sided subclavian PPM placed 08/2017 Atrial Fibrillation/Flutter (eliquis , metoprolol ), 08/2021 extraction of left axillary approach transvenous pacing system and Micra AV leadless pacemaker    Anual follow-up and CPAP machine reorder.  He has been using his old CPAP machine, which is still functioning despite past issues. He finds the CPAP machine very effective, with no nocturnal awakenings or nocturia. He experienced a bacterial infection following a pacemaker replacement in October, requiring hospitalization and rehabilitation. He is on lifelong amoxicillin , taking it twice daily, with additional doses before dental visits. He manages fluid retention with torasemide, monitoring his weight daily and adjusting medication as needed.   We sent order in jan 2025 to [facility] but they did not fill order & in May told him that he required a F2F visit   Significant tests/ events reviewed   2008 PSG RDI 48/h ONO on CPAP /RA 08/2014 >> no desatn - dc O2    Review of Systems neg for any significant sore throat, dysphagia, itching, sneezing, nasal congestion or excess/ purulent secretions, fever, chills, sweats, unintended wt loss, pleuritic or exertional cp, hempoptysis, orthopnea pnd or change in chronic leg swelling. Also denies presyncope, palpitations, heartburn, abdominal pain, nausea, vomiting, diarrhea or change in bowel or urinary habits, dysuria,hematuria, rash, arthralgias, visual complaints, headache, numbness weakness or ataxia.     Objective:   Physical Exam  Gen. Pleasant, obese, in no distress ENT - no lesions, no post nasal drip Neck: No JVD, no thyromegaly, no  carotid bruits Lungs: no use of accessory muscles, no dullness to percussion, decreased without rales or rhonchi  Cardiovascular: Rhythm regular, heart sounds  normal, no murmurs or gallops, no peripheral edema Musculoskeletal: No deformities, no cyanosis or clubbing , no tremors       Assessment & Plan:   Obstructive Sleep Apnea Obstructive sleep apnea managed with CPAP therapy. He reports satisfaction with the current CPAP machine, which he has used for seven to eight years. There was an issue with the order for a new CPAP machine placed in January, which was not fulfilled by [facility]. The current machine is still functional, but there was a concern about a potential failure. He is very compliant with the machine and does not miss a single night, which has helped improve fatigue. - Send new CPAP prescription to [facility] with settings adjusted to auto CPAP, 12 to 15 cm H2O. - Instruct him to check with [facility] in a couple of days and have him call the office directly if there are any issues.  Pacemaker Replacement and Bacterial Infection Pacemaker replacement complicated by bacterial infection requiring hospitalization and prolonged antibiotic therapy. Currently on lifelong amoxicillin  to prevent further infections.  He is aware of the need for additional amoxicillin  prior to dental procedures to prevent endocarditis.  Fluid Retention Managed with Torasemide Fluid retention managed with torasemide. He is compliant with daily weight monitoring and adjusts torasemide dosage based on weight changes. Reports well-managed fluid retention.

## 2024-01-12 NOTE — Patient Instructions (Signed)
 X Order for replacement autoCPAP 12-15 cm

## 2024-01-12 NOTE — Progress Notes (Signed)
 Patient presents today with a callus it has been blistering and bleeding for the past several weeks.  He gets a callus there regularly.  Has not had any fever chills nausea or vomiting because of infection right involving a pacemaker he is in on Augmentin  twice daily.  Physical Exam:  Patient alert and oriented x 3.  No complaints of nausea, vomiting, fever, or chills  Vascular: DP pulses 2/4 bilateral. PT pulses 2/4 lateral.  Moderate edema lower legs and especially around the fifth metatarsal phalangeal joint on the left foot. Capillary fill time immediate bilateral.  Dermatologic: Full-thickness ulceration penetrating subcutaneous tissue plantar aspect fifth metatarsal head right with base of granulation and clear drainage.  No signs of infection.  Minimal undermining.  Moderate exudate. Measures 11 mm wide x 10 mm long x 4 deep.   Neurologic: Vibratory sensation significantly decreased bilaterally.  Reduced Achilles tendon reflex bilaterally.  Musculoskeletal: Tailor's bunion deformities bilaterally.  Radiographs: 3 views foot left: Tailor's bunion formed with increased fourth fifth intermetatarsal angle.  No notes any osteomyelitis.  Cortical margins are all intact with no radiolucencies noted in the bone.  No subcutaneous emphysema.  No evidence of bone tumors.  Diagnoses: 1.  Full-thickness ulceration penetrating the subcutaneous tissue plantar fifth metatarsal head left foot. 2.  Tailor's bunion deformity left..  Plan: -Sharply debrided devitalized tissue from full-thickness ulceration into the subcutaneous tissue left foot.  Debrided any devitalized tissue to a good bleeding base.  Applied antibiotic ointment and a light dressing. - Wound care: Soak twice daily warm Epsom salt water for 15 minutes, apply Bactroban  ointment, and apply it dressing. - Surgical shoe left dispensed - Reduce activity and minimizing weightbearing is much as possible -Rx Bactroban  ointment apply twice  daily to wound after soaks   Return 1 week f/u ulcer  '

## 2024-01-19 ENCOUNTER — Other Ambulatory Visit: Payer: Self-pay | Admitting: Family Medicine

## 2024-01-19 NOTE — Telephone Encounter (Signed)
 Requesting: Lyrica   Contract: None UDS: None Last Visit: 12/19/23 Next Visit: None Last Refill: see med list   Please Advise

## 2024-01-20 ENCOUNTER — Encounter: Payer: Self-pay | Admitting: Podiatry

## 2024-01-20 ENCOUNTER — Ambulatory Visit (INDEPENDENT_AMBULATORY_CARE_PROVIDER_SITE_OTHER): Admitting: Podiatry

## 2024-01-20 VITALS — Ht 70.0 in | Wt 235.0 lb

## 2024-01-20 DIAGNOSIS — A498 Other bacterial infections of unspecified site: Secondary | ICD-10-CM | POA: Diagnosis not present

## 2024-01-20 DIAGNOSIS — R5381 Other malaise: Secondary | ICD-10-CM | POA: Diagnosis not present

## 2024-01-20 DIAGNOSIS — L97522 Non-pressure chronic ulcer of other part of left foot with fat layer exposed: Secondary | ICD-10-CM

## 2024-01-20 DIAGNOSIS — R5383 Other fatigue: Secondary | ICD-10-CM | POA: Diagnosis not present

## 2024-01-20 DIAGNOSIS — Z952 Presence of prosthetic heart valve: Secondary | ICD-10-CM | POA: Diagnosis not present

## 2024-01-20 DIAGNOSIS — Z792 Long term (current) use of antibiotics: Secondary | ICD-10-CM | POA: Diagnosis not present

## 2024-01-20 NOTE — Progress Notes (Signed)
 Patient presents follow-up ulcer.  No Ester-C or N/V.  He has been doing wound care as instructed.  Wears a surgical shoe today.  Physical Exam:  Patient alert and oriented x 3.  No complaints of nausea, vomiting, fever, or chills  Vascular: DP pulses 2/4 bilateral. PT pulses 1/4 lateral.  Mild to moderate edema. Capillary fill time immediate bilaterally.  Dermatologic: Full-thickness ulceration plantar fifth MTP left foot penetrating the subcutaneous tissue with good base of granulation tissue and no signs of infection.  Moderate drainage clear. Measures 8 mm wide x 6 mm long x 3 deep.  Neurologic:   Musculoskeletal: Tailor's bunion deformity left    Diagnoses: 1.  Full-thickness ulceration Wagner grade 2 subfifth MTP left-improved.  Plan: -Continue current wound care soaking twice daily warm Epsom salt water, applying Bactroban  ointment, and a light dressing.  Continue surgical shoe for all weightbearing keep foot elevated is much as possible -Sharp debridement full-thickness ulceration plantar left foot into the subcutaneous tissue.  Sharply debrided any devitalized tissue down to a good bleeding base.  Applied dressing  Return 2 weeks follow-up ulcer left    Return 2 weeks f/u ulcer

## 2024-01-22 DIAGNOSIS — R7989 Other specified abnormal findings of blood chemistry: Secondary | ICD-10-CM | POA: Diagnosis not present

## 2024-02-01 ENCOUNTER — Other Ambulatory Visit: Payer: Self-pay | Admitting: Cardiology

## 2024-02-01 DIAGNOSIS — I483 Typical atrial flutter: Secondary | ICD-10-CM

## 2024-02-03 ENCOUNTER — Ambulatory Visit: Admitting: Podiatry

## 2024-02-04 NOTE — Telephone Encounter (Signed)
 Prescription refill request for Eliquis  received. Indication:tavr Last office visit:6/25 Scr:1.78  7/25 Age: 83 Weight:106.6  kg  Prescription refilled

## 2024-02-10 ENCOUNTER — Inpatient Hospital Stay (HOSPITAL_BASED_OUTPATIENT_CLINIC_OR_DEPARTMENT_OTHER): Admitting: Family

## 2024-02-10 ENCOUNTER — Inpatient Hospital Stay: Attending: Hematology & Oncology

## 2024-02-10 ENCOUNTER — Inpatient Hospital Stay

## 2024-02-10 ENCOUNTER — Ambulatory Visit (INDEPENDENT_AMBULATORY_CARE_PROVIDER_SITE_OTHER): Admitting: Podiatry

## 2024-02-10 ENCOUNTER — Encounter: Payer: Self-pay | Admitting: Family

## 2024-02-10 VITALS — BP 119/67 | HR 78 | Temp 97.8°F | Resp 18 | Ht 70.0 in | Wt 231.8 lb

## 2024-02-10 DIAGNOSIS — Z950 Presence of cardiac pacemaker: Secondary | ICD-10-CM | POA: Diagnosis not present

## 2024-02-10 DIAGNOSIS — G629 Polyneuropathy, unspecified: Secondary | ICD-10-CM | POA: Insufficient documentation

## 2024-02-10 DIAGNOSIS — D519 Vitamin B12 deficiency anemia, unspecified: Secondary | ICD-10-CM

## 2024-02-10 DIAGNOSIS — E611 Iron deficiency: Secondary | ICD-10-CM | POA: Diagnosis not present

## 2024-02-10 DIAGNOSIS — D510 Vitamin B12 deficiency anemia due to intrinsic factor deficiency: Secondary | ICD-10-CM | POA: Insufficient documentation

## 2024-02-10 DIAGNOSIS — Z79899 Other long term (current) drug therapy: Secondary | ICD-10-CM | POA: Insufficient documentation

## 2024-02-10 DIAGNOSIS — D509 Iron deficiency anemia, unspecified: Secondary | ICD-10-CM | POA: Diagnosis not present

## 2024-02-10 DIAGNOSIS — L97522 Non-pressure chronic ulcer of other part of left foot with fat layer exposed: Secondary | ICD-10-CM

## 2024-02-10 DIAGNOSIS — D508 Other iron deficiency anemias: Secondary | ICD-10-CM

## 2024-02-10 DIAGNOSIS — C8332 Diffuse large B-cell lymphoma, intrathoracic lymph nodes: Secondary | ICD-10-CM | POA: Diagnosis not present

## 2024-02-10 DIAGNOSIS — D518 Other vitamin B12 deficiency anemias: Secondary | ICD-10-CM

## 2024-02-10 LAB — CMP (CANCER CENTER ONLY)
ALT: 20 U/L (ref 0–44)
AST: 29 U/L (ref 15–41)
Albumin: 4.4 g/dL (ref 3.5–5.0)
Alkaline Phosphatase: 71 U/L (ref 38–126)
Anion gap: 13 (ref 5–15)
BUN: 28 mg/dL — ABNORMAL HIGH (ref 8–23)
CO2: 26 mmol/L (ref 22–32)
Calcium: 9.8 mg/dL (ref 8.9–10.3)
Chloride: 102 mmol/L (ref 98–111)
Creatinine: 1.66 mg/dL — ABNORMAL HIGH (ref 0.61–1.24)
GFR, Estimated: 40 mL/min — ABNORMAL LOW (ref >60)
Glucose, Bld: 148 mg/dL — ABNORMAL HIGH (ref 70–99)
Potassium: 4.6 mmol/L (ref 3.5–5.1)
Sodium: 142 mmol/L (ref 135–145)
Total Bilirubin: 0.5 mg/dL (ref 0.0–1.2)
Total Protein: 7.3 g/dL (ref 6.5–8.1)

## 2024-02-10 LAB — CBC WITH DIFFERENTIAL (CANCER CENTER ONLY)
Abs Immature Granulocytes: 0.05 K/uL (ref 0.00–0.07)
Basophils Absolute: 0 K/uL (ref 0.0–0.1)
Basophils Relative: 1 %
Eosinophils Absolute: 0.4 K/uL (ref 0.0–0.5)
Eosinophils Relative: 6 %
HCT: 42.6 % (ref 39.0–52.0)
Hemoglobin: 14.1 g/dL (ref 13.0–17.0)
Immature Granulocytes: 1 %
Lymphocytes Relative: 25 %
Lymphs Abs: 1.8 K/uL (ref 0.7–4.0)
MCH: 30.1 pg (ref 26.0–34.0)
MCHC: 33.1 g/dL (ref 30.0–36.0)
MCV: 90.8 fL (ref 80.0–100.0)
Monocytes Absolute: 0.6 K/uL (ref 0.1–1.0)
Monocytes Relative: 8 %
Neutro Abs: 4.3 K/uL (ref 1.7–7.7)
Neutrophils Relative %: 59 %
Platelet Count: 180 K/uL (ref 150–400)
RBC: 4.69 MIL/uL (ref 4.22–5.81)
RDW: 16.7 % — ABNORMAL HIGH (ref 11.5–15.5)
WBC Count: 7.2 K/uL (ref 4.0–10.5)
nRBC: 0 % (ref 0.0–0.2)

## 2024-02-10 LAB — FERRITIN: Ferritin: 1439 ng/mL — ABNORMAL HIGH (ref 24–336)

## 2024-02-10 LAB — IRON AND IRON BINDING CAPACITY (CC-WL,HP ONLY)
Iron: 83 ug/dL (ref 45–182)
Saturation Ratios: 22 % (ref 17.9–39.5)
TIBC: 384 ug/dL (ref 250–450)
UIBC: 301 ug/dL

## 2024-02-10 LAB — VITAMIN B12: Vitamin B-12: 211 pg/mL (ref 180–914)

## 2024-02-10 MED ORDER — DENOSUMAB 120 MG/1.7ML ~~LOC~~ SOLN
120.0000 mg | Freq: Once | SUBCUTANEOUS | Status: AC
Start: 2024-02-10 — End: 2024-02-10
  Administered 2024-02-10 (×2): 120 mg via SUBCUTANEOUS
  Filled 2024-02-10: qty 1.7

## 2024-02-10 MED ORDER — CYANOCOBALAMIN 1000 MCG/ML IJ SOLN
1000.0000 ug | Freq: Once | INTRAMUSCULAR | Status: AC
  Administered 2024-02-10 (×2): 1000 ug via INTRAMUSCULAR
  Filled 2024-02-10: qty 1

## 2024-02-10 NOTE — Progress Notes (Signed)
 Patient presents follow-up ulcer left foot.  Says the ulcer noted on the plantar aspect of the treatment seems to be doing well has been a new wound on top of the foot where the surgical shoe rubbed a skin overall on the top dorsal lateral part of the foot.  Has noticed some scabbing and some clear drainage.  Has not noted any purulence.  No fever or chills or nausea or vomiting.  Physical Exam:  Patient alert and oriented x 3.  No complaints of nausea, vomiting, fever, or chills  Vascular: DP pulses 2/4 bilateral. PT pulses 1/4 lateral. Mild edema lower extremity bilaterally.   Dermatologic: Full-thickness ulceration plantar fifth MTP left penetrating the subcutaneous tissue.  Mild clear drainage.  No signs of infection.  Good base of granulation tissue.  Ulcer much improved.. Measures 4mm wide x 4 mm long x 3mm deep.   There is a 50 x 30 mm superficial wound on the dorsal lateral aspect of the left foot.  Apparently the surgical shoe rubbed into this area.  It is covered with a crusting and slight clear drainage.  There is no signs of infection.  Only penetrates into the dermis.  Neurologic:   Musculoskeletal:    Diagnoses: 1.  Full-thickness ulcer Wagner grade 2 left foot.  Plan: -Continue current wound care soaking twice daily warm Epsom salt water, applying Bactroban  ointment, and a light dressing.  Continue surgical shoe for all weightbearing keep foot elevated is much as possible -Padded off the surgical shoe to prevent this from occurring again.  Told to wear good thick sock on the foot also when wearing shoe.  Minimize ambulation while wearing shoe is much as possible -Sharp debridement full-thickness ulceration plantar left foot into the subcutaneous tissue.  Sharply debrided any devitalized tissue down to a good bleeding base.  Applied dressing   Return 2 weeks follow-up ulcer left

## 2024-02-10 NOTE — Patient Instructions (Signed)
 Denosumab Injection (Oncology) What is this medication? DENOSUMAB (den oh SUE mab) prevents weakened bones caused by cancer. It may also be used to treat noncancerous bone tumors that cannot be removed by surgery. It can also be used to treat high calcium levels in the blood caused by cancer. It works by blocking a protein that causes bones to break down quickly. This slows down the release of calcium from bones, which lowers calcium levels in your blood. It also makes your bones stronger and less likely to break (fracture). This medicine may be used for other purposes; ask your health care provider or pharmacist if you have questions. COMMON BRAND NAME(S): XGEVA What should I tell my care team before I take this medication? They need to know if you have any of these conditions: Dental disease Having surgery or tooth extraction Infection Kidney disease Low levels of calcium or vitamin D in the blood Malnutrition On hemodialysis Skin conditions or sensitivity Thyroid or parathyroid disease An unusual reaction to denosumab, other medications, foods, dyes, or preservatives Pregnant or trying to get pregnant Breast-feeding How should I use this medication? This medication is for injection under the skin. It is given by your care team in a hospital or clinic setting. A special MedGuide will be given to you before each treatment. Be sure to read this information carefully each time. Talk to your care team about the use of this medication in children. While it may be prescribed for children as young as 13 years for selected conditions, precautions do apply. Overdosage: If you think you have taken too much of this medicine contact a poison control center or emergency room at once. NOTE: This medicine is only for you. Do not share this medicine with others. What if I miss a dose? Keep appointments for follow-up doses. It is important not to miss your dose. Call your care team if you are unable to  keep an appointment. What may interact with this medication? Do not take this medication with any of the following: Other medications containing denosumab This medication may also interact with the following: Medications that lower your chance of fighting infection Steroid medications, such as prednisone or cortisone This list may not describe all possible interactions. Give your health care provider a list of all the medicines, herbs, non-prescription drugs, or dietary supplements you use. Also tell them if you smoke, drink alcohol, or use illegal drugs. Some items may interact with your medicine. What should I watch for while using this medication? Your condition will be monitored carefully while you are receiving this medication. You may need blood work while taking this medication. This medication may increase your risk of getting an infection. Call your care team for advice if you get a fever, chills, sore throat, or other symptoms of a cold or flu. Do not treat yourself. Try to avoid being around people who are sick. You should make sure you get enough calcium and vitamin D while you are taking this medication, unless your care team tells you not to. Discuss the foods you eat and the vitamins you take with your care team. Some people who take this medication have severe bone, joint, or muscle pain. This medication may also increase your risk for jaw problems or a broken thigh bone. Tell your care team right away if you have severe pain in your jaw, bones, joints, or muscles. Tell your care team if you have any pain that does not go away or that gets worse. Talk  to your care team if you may be pregnant. Serious birth defects can occur if you take this medication during pregnancy and for 5 months after the last dose. You will need a negative pregnancy test before starting this medication. Contraception is recommended while taking this medication and for 5 months after the last dose. Your care team  can help you find the option that works for you. What side effects may I notice from receiving this medication? Side effects that you should report to your care team as soon as possible: Allergic reactions--skin rash, itching, hives, swelling of the face, lips, tongue, or throat Bone, joint, or muscle pain Low calcium level--muscle pain or cramps, confusion, tingling, or numbness in the hands or feet Osteonecrosis of the jaw--pain, swelling, or redness in the mouth, numbness of the jaw, poor healing after dental work, unusual discharge from the mouth, visible bones in the mouth Side effects that usually do not require medical attention (report to your care team if they continue or are bothersome): Cough Diarrhea Fatigue Headache Nausea This list may not describe all possible side effects. Call your doctor for medical advice about side effects. You may report side effects to FDA at 1-800-FDA-1088. Where should I keep my medication? This medication is given in a hospital or clinic. It will not be stored at home. NOTE: This sheet is a summary. It may not cover all possible information. If you have questions about this medicine, talk to your doctor, pharmacist, or health care provider.  2024 Elsevier/Gold Standard (2021-11-07 00:00:00) Vitamin B12 Injection What is this medication? Vitamin B12 (VAHY tuh min B12) prevents and treats low vitamin B12 levels in your body. It is used in people who do not get enough vitamin B12 from their diet or when their digestive tract does not absorb enough. Vitamin B12 plays an important role in maintaining the health of your nervous system and red blood cells. This medicine may be used for other purposes; ask your health care provider or pharmacist if you have questions. COMMON BRAND NAME(S): B-12 Compliance Kit, B-12 Injection Kit, Cyomin, Dodex, LA-12, Nutri-Twelve, Physicians EZ Use B-12, Primabalt, Vitamin Deficiency Injectable System - B12 What should I  tell my care team before I take this medication? They need to know if you have any of these conditions: Kidney disease Leber's disease Megaloblastic anemia An unusual or allergic reaction to cyanocobalamin, cobalt, other medications, foods, dyes, or preservatives Pregnant or trying to get pregnant Breast-feeding How should I use this medication? This medication is injected into a muscle or deeply under the skin. It is usually given in a clinic or care team's office. However, your care team may teach you how to inject yourself. Follow all instructions. Talk to your care team about the use of this medication in children. Special care may be needed. Overdosage: If you think you have taken too much of this medicine contact a poison control center or emergency room at once. NOTE: This medicine is only for you. Do not share this medicine with others. What if I miss a dose? If you are given your dose at a clinic or care team's office, call to reschedule your appointment. If you give your own injections, and you miss a dose, take it as soon as you can. If it is almost time for your next dose, take only that dose. Do not take double or extra doses. What may interact with this medication? Alcohol Colchicine This list may not describe all possible interactions. Give your  health care provider a list of all the medicines, herbs, non-prescription drugs, or dietary supplements you use. Also tell them if you smoke, drink alcohol, or use illegal drugs. Some items may interact with your medicine. What should I watch for while using this medication? Visit your care team regularly. You may need blood work done while you are taking this medication. You may need to follow a special diet. Talk to your care team. Limit your alcohol intake and avoid smoking to get the best benefit. What side effects may I notice from receiving this medication? Side effects that you should report to your care team as soon as  possible: Allergic reactions--skin rash, itching, hives, swelling of the face, lips, tongue, or throat Swelling of the ankles, hands, or feet Trouble breathing Side effects that usually do not require medical attention (report to your care team if they continue or are bothersome): Diarrhea This list may not describe all possible side effects. Call your doctor for medical advice about side effects. You may report side effects to FDA at 1-800-FDA-1088. Where should I keep my medication? Keep out of the reach of children. Store at room temperature between 15 and 30 degrees C (59 and 85 degrees F). Protect from light. Throw away any unused medication after the expiration date. NOTE: This sheet is a summary. It may not cover all possible information. If you have questions about this medicine, talk to your doctor, pharmacist, or health care provider.  2024 Elsevier/Gold Standard (2021-02-27 00:00:00)

## 2024-02-10 NOTE — Progress Notes (Signed)
 Hematology and Oncology Follow Up Visit  Richard [name] [id number] 08-02-1940 83 y.o. 02/10/2024   Principle Diagnosis:  Diffuse large cell non-Hodgkin's lymphoma (IPI = 3) - NOT double hit Pernicious anemia Iron deficiency secondary to bleeding   Past Therapy: R-CHOP - s/p cycle 8 - completed 08/2017   Current Therapy:        Vitamin B12 1 mg IM every month Xgeva  120 mg subcu q 3 months - next dose due in 05/2024 IV iron as indicated   Interim History:  Richard [name] is here today for follow-up, Xgeva  and B 12 injection. He is doing quite well and has no complaints at this time.  He has an appointment with podiatry to remove a callus in a few weeks.  No issue with fever, chills, n/v, cough, rash, dizziness, SOB, chest pain, palpitations, abdominal pan or changes in bowel or bladder habits.  No swelling or tenderness in his extremities.  Neuropathy in his feet unchanged from baseline.  No falls or syncope.  Appetite and hydration are good. Weight is stable at 231 lbs.   ECOG Performance Status: 1 - Symptomatic but completely ambulatory  Medications:  Allergies as of 02/10/2024       Reactions   Ace Inhibitors Swelling, Other (See Comments)   Angioedema   Benazepril Swelling, Other (See Comments)   Angioedema; he is not a candidate for any angiotensin receptor blockers because of this significant allergic reaction. Because of a history of documented adverse serious drug reaction;Medi Alert bracelet  is recommended   Entresto  [sacubitril -valsartan ] Swelling, Other (See Comments)   First-in-Class Angiotensin Receptor Neprilysin Inhibitor- Med was red-flagged by the patient's pharmacy for him to NOT take!   Hctz [hydrochlorothiazide] Anaphylaxis, Swelling   Tongue and lip swelling   Aspirin  Other (See Comments)   Gastritis, can aspirin  not take 325 mg aspirin         Medication List        Accurate as of February 10, 2024  9:11 AM. If you have any questions, ask your  nurse or doctor.          acetaminophen  325 MG tablet Commonly known as: TYLENOL  Take 650 mg by mouth at bedtime as needed for moderate pain (pain score 4-6) or headache.   allopurinol  300 MG tablet Commonly known as: ZYLOPRIM  Take 450 mg by mouth daily.   aluminum hydroxide-magnesium  carbonate 95-358 MG/15ML Susp Commonly known as: GAVISCON Take 15 mLs by mouth as needed for indigestion or heartburn.   amoxicillin  500 MG capsule Commonly known as: AMOXIL  Take 500 mg by mouth 2 (two) times daily.   atorvastatin  80 MG tablet Commonly known as: LIPITOR  TAKE 1 TABLET BY MOUTH EVERYDAY AT BEDTIME   Azelastine  HCl 137 MCG/SPRAY Soln PLACE 2 SPRAYS INTO BOTH NOSTRILS AT BEDTIME AS NEEDED FOR RHINITIS OR ALLERGIES.   CO Q-10 PO Take 100 mg by mouth daily.   Eliquis  2.5 MG Tabs tablet Generic drug: apixaban  TAKE 1 TABLET BY MOUTH TWICE A DAY   esomeprazole  40 MG capsule Commonly known as: NexIUM  Take 1 capsule (40 mg total) by mouth daily.   ezetimibe  10 MG tablet Commonly known as: ZETIA  Take 1 tablet (10 mg total) by mouth daily. Pt needs office visit for further refills   Farxiga  10 MG Tabs tablet Generic drug: dapagliflozin  propanediol Take 10 mg by mouth in the morning.   fenofibrate  160 MG tablet TAKE 1 TABLET BY MOUTH EVERY DAY   folic acid  1 MG tablet Commonly known  as: FOLVITE  TAKE 2 TABLETS BY MOUTH EVERY DAY   freestyle lancets USE ONCE A DAY TO CHECK BLOOD SUGAR.   FREESTYLE LITE test strip Generic drug: glucose blood USE TO TEST BLOOD SUGAR ONCE A DAY. DX CODE: E11.9   gabapentin  100 MG capsule Commonly known as: NEURONTIN  Take 100 mg by mouth at bedtime. PRN   glimepiride  2 MG tablet Commonly known as: AMARYL  TAKE 1 TABLET (2 MG TOTAL) BY MOUTH IN THE MORNING AND AT BEDTIME.   ICY HOT ADVANCED PAIN RELIEF EX Apply 1 application  topically daily as needed (to painful sites).   metoprolol  succinate 25 MG 24 hr tablet Commonly known as:  TOPROL -XL TAKE 1 TABLET (25 MG TOTAL) BY MOUTH DAILY.   mupirocin  ointment 2 % Commonly known as: BACTROBAN  Apply 1 Application topically 2 (two) times daily.   nitroGLYCERIN  0.4 MG SL tablet Commonly known as: NITROSTAT  PLACE 1 TABLET UNDER THE TONGUE EVERY 5 MINUTES AS NEEDED FOR CHEST PAIN.   potassium chloride  SA 20 MEQ tablet Commonly known as: KLOR-CON  M TAKE 2 TABLETS BY MOUTH EVERY DAY   pregabalin  100 MG capsule Commonly known as: LYRICA  TAKE 1 CAPSULE (100 MG TOTAL) BY MOUTH DAILY. AT NOON   pregabalin  150 MG capsule Commonly known as: LYRICA  TAKE 1 CAPSULE (150 MG TOTAL) BY MOUTH AT BEDTIME. (TAKE 100MG  DAILY AT NOON)   psyllium 58.6 % powder Commonly known as: METAMUCIL Take 1 packet by mouth daily as needed (for constipation- mix and drink).   torsemide  20 MG tablet Commonly known as: DEMADEX  TAKE 2 TAB BY MOUTH IN MORNING TAKE EXTRA TABLET FOR WEIGHT GAIN MORE THAN 2-3LBS IN 24HR FOR 90DAY   Trulicity  4.5 MG/0.5ML Soaj Generic drug: Dulaglutide  Inject 4.5 mg into the skin once a week.   Vascepa  1 g capsule Generic drug: icosapent  Ethyl TAKE 2 CAPSULES BY MOUTH 2 TIMES DAILY.   VITAMIN C  PO Take 1 tablet by mouth daily with breakfast.        Allergies:  Allergies  Allergen Reactions   Ace Inhibitors Swelling and Other (See Comments)    Angioedema    Benazepril Swelling and Other (See Comments)    Angioedema; he is not a candidate for any angiotensin receptor blockers because of this significant allergic reaction. Because of a history of documented adverse serious drug reaction;Medi Alert bracelet  is recommended   Entresto  [Sacubitril -Valsartan ] Swelling and Other (See Comments)    First-in-Class Angiotensin Receptor Neprilysin Inhibitor- Med was red-flagged by the patient's pharmacy for him to NOT take!   Hctz [Hydrochlorothiazide] Anaphylaxis and Swelling    Tongue and lip swelling    Aspirin  Other (See Comments)    Gastritis, can aspirin   not take 325 mg aspirin     Past Medical History, Surgical history, Social history, and Family History were reviewed and updated.  Review of Systems: All other 10 point review of systems is negative.   Physical Exam:  height is 5' 10 (1.778 m) and weight is 231 lb 12.8 oz (105.1 kg). His oral temperature is 97.8 F (36.6 C). His blood pressure is 119/67 and his pulse is 78. His respiration is 18 and oxygen  saturation is 97%.   Wt Readings from Last 3 Encounters:  02/10/24 231 lb 12.8 oz (105.1 kg)  01/20/24 235 lb (106.6 kg)  01/12/24 235 lb (106.6 kg)    Ocular: Sclerae unicteric, pupils equal, round and reactive to light Ear-nose-throat: Oropharynx clear, dentition fair Lymphatic: No cervical or supraclavicular adenopathy Lungs no  rales or rhonchi, good excursion bilaterally Heart regular rate and rhythm, no murmur appreciated Abd soft, nontender, positive bowel sounds MSK no focal spinal tenderness, no joint edema Neuro: non-focal, well-oriented, appropriate affect Breasts: Deferred   Lab Results  Component Value Date   WBC 7.2 02/10/2024   HGB 14.1 02/10/2024   HCT 42.6 02/10/2024   MCV 90.8 02/10/2024   PLT 180 02/10/2024   Lab Results  Component Value Date   FERRITIN 2,207 (H) 01/09/2024   IRON 72 01/09/2024   TIBC 395 01/09/2024   UIBC 323 01/09/2024   IRONPCTSAT 18 01/09/2024   Lab Results  Component Value Date   RETICCTPCT 1.7 04/07/2023   RBC 4.69 02/10/2024   RETICCTABS 52.1 11/22/2011   No results found for: JONATHAN BONG Richard [name] Lab Results  Component Value Date   IGA 159 03/09/2012   Lab Results  Component Value Date   ALBUMINELP 4.2 07/27/2018   MSPIKE Not Observed 07/27/2018     Chemistry      Component Value Date/Time   NA 142 02/10/2024 0801   NA 138 02/08/2022 0811   NA 144 06/27/2017 0857   K 4.6 02/10/2024 0801   K 3.9 06/27/2017 0857   CL 102 02/10/2024 0801   CL 103 06/27/2017 0857   CO2 26 02/10/2024  0801   CO2 26 06/27/2017 0857   BUN 28 (H) 02/10/2024 0801   BUN 46 (H) 02/08/2022 0811   BUN 12 06/27/2017 0857   CREATININE 1.66 (H) 02/10/2024 0801   CREATININE 2.01 (H) 12/19/2023 1443      Component Value Date/Time   CALCIUM  9.8 02/10/2024 0801   CALCIUM  9.2 06/27/2017 0857   ALKPHOS 71 02/10/2024 0801   ALKPHOS 128 (H) 06/27/2017 0857   AST 29 02/10/2024 0801   ALT 20 02/10/2024 0801   ALT 24 06/27/2017 0857   BILITOT 0.5 02/10/2024 0801       Impression and Plan:  Richard [name] is a very pleasant 83 yo caucasian gentleman with diffuse large B-cell lymphoma (not double hit lymphoma). He completed treatment in February 2019.  We will proceed with Xgeva  and B 12 injection.  Iron studies pending. We will replace if needed.  CBC and CMP routed to Dr. ALONSO Stank with nephrology and Richard Pomfret, PA-C with ID.  Follow-up in 1 month.    Lauraine Pepper, NP 8/12/20259:11 AM

## 2024-02-16 ENCOUNTER — Other Ambulatory Visit: Payer: Self-pay | Admitting: Family Medicine

## 2024-02-16 DIAGNOSIS — E1151 Type 2 diabetes mellitus with diabetic peripheral angiopathy without gangrene: Secondary | ICD-10-CM

## 2024-02-17 ENCOUNTER — Other Ambulatory Visit: Payer: Self-pay | Admitting: Family Medicine

## 2024-02-17 DIAGNOSIS — E781 Pure hyperglyceridemia: Secondary | ICD-10-CM

## 2024-02-24 ENCOUNTER — Ambulatory Visit (INDEPENDENT_AMBULATORY_CARE_PROVIDER_SITE_OTHER): Admitting: Podiatry

## 2024-02-24 ENCOUNTER — Encounter: Payer: Self-pay | Admitting: Podiatry

## 2024-02-24 VITALS — Ht 70.0 in | Wt 231.0 lb

## 2024-02-24 DIAGNOSIS — L97502 Non-pressure chronic ulcer of other part of unspecified foot with fat layer exposed: Secondary | ICD-10-CM | POA: Diagnosis not present

## 2024-02-24 NOTE — Progress Notes (Signed)
 Patient presents follow-up ulcer left foot.  Seems to be doing better wound on the top of the foot is almost healed.  No fever chills or nausea or vomiting.  Still doing wound care as instructed  Physical Exam:  Patient alert and oriented x 3.  No complaints of nausea, vomiting, fever, or chills  Vascular: DP pulses 2/4 bilateral. PT pulses 1 immediately/4 lateral. . Capillary fill time immediate left.  Dermatologic: Full-thickness ulceration plantar fifth MTP left.  Minimal clear drainage.  Granulation tissue present.  No signs of infection..  Predebridement ulcer measures 2mm wide x 1 mm long x 1 deep.   Neurologic:   Musculoskeletal:     Diagnoses: 1.  Full-thickness ulceration Wagner grade 2 plantar fifth metatarsal phalangeal joint left foot.-Healing well.  Plan: -Continue wound care soaking acute twice daily warm salt water for 15 minutes, apply Bactroban  ointment, and light dressing.  Continue surgical shoe. -Sharp debridement full-thickness ulceration subfifth MTP left foot.  Sharply debrided devitalized tissue into subcutaneous tissue with a 312 blade and tissue nippers.  Some bleeding noted.  Postdebridement size right ulcer 3 mm x 2 mm X 3 mm deep   Return 2 weeks f/u ulcer left

## 2024-02-28 ENCOUNTER — Other Ambulatory Visit: Payer: Self-pay | Admitting: Student

## 2024-03-04 ENCOUNTER — Ambulatory Visit (HOSPITAL_BASED_OUTPATIENT_CLINIC_OR_DEPARTMENT_OTHER): Admitting: General Surgery

## 2024-03-09 ENCOUNTER — Inpatient Hospital Stay (HOSPITAL_BASED_OUTPATIENT_CLINIC_OR_DEPARTMENT_OTHER): Admitting: Family

## 2024-03-09 ENCOUNTER — Inpatient Hospital Stay: Attending: Hematology & Oncology

## 2024-03-09 ENCOUNTER — Ambulatory Visit (INDEPENDENT_AMBULATORY_CARE_PROVIDER_SITE_OTHER): Admitting: Podiatry

## 2024-03-09 ENCOUNTER — Inpatient Hospital Stay

## 2024-03-09 ENCOUNTER — Encounter: Payer: Self-pay | Admitting: Podiatry

## 2024-03-09 VITALS — BP 126/68 | HR 63 | Temp 97.5°F | Resp 17 | Ht 70.0 in | Wt 235.1 lb

## 2024-03-09 DIAGNOSIS — D518 Other vitamin B12 deficiency anemias: Secondary | ICD-10-CM | POA: Diagnosis not present

## 2024-03-09 DIAGNOSIS — C8332 Diffuse large B-cell lymphoma, intrathoracic lymph nodes: Secondary | ICD-10-CM | POA: Diagnosis not present

## 2024-03-09 DIAGNOSIS — L97521 Non-pressure chronic ulcer of other part of left foot limited to breakdown of skin: Secondary | ICD-10-CM | POA: Diagnosis not present

## 2024-03-09 DIAGNOSIS — Z79899 Other long term (current) drug therapy: Secondary | ICD-10-CM | POA: Insufficient documentation

## 2024-03-09 DIAGNOSIS — G629 Polyneuropathy, unspecified: Secondary | ICD-10-CM | POA: Diagnosis not present

## 2024-03-09 DIAGNOSIS — E611 Iron deficiency: Secondary | ICD-10-CM | POA: Diagnosis not present

## 2024-03-09 DIAGNOSIS — D509 Iron deficiency anemia, unspecified: Secondary | ICD-10-CM | POA: Diagnosis not present

## 2024-03-09 DIAGNOSIS — Z950 Presence of cardiac pacemaker: Secondary | ICD-10-CM | POA: Diagnosis not present

## 2024-03-09 DIAGNOSIS — D510 Vitamin B12 deficiency anemia due to intrinsic factor deficiency: Secondary | ICD-10-CM | POA: Insufficient documentation

## 2024-03-09 DIAGNOSIS — D519 Vitamin B12 deficiency anemia, unspecified: Secondary | ICD-10-CM

## 2024-03-09 LAB — FERRITIN: Ferritin: 1312 ng/mL — ABNORMAL HIGH (ref 24–336)

## 2024-03-09 LAB — CBC WITH DIFFERENTIAL (CANCER CENTER ONLY)
Abs Immature Granulocytes: 0.03 K/uL (ref 0.00–0.07)
Basophils Absolute: 0 K/uL (ref 0.0–0.1)
Basophils Relative: 1 %
Eosinophils Absolute: 0.2 K/uL (ref 0.0–0.5)
Eosinophils Relative: 3 %
HCT: 43.9 % (ref 39.0–52.0)
Hemoglobin: 14.1 g/dL (ref 13.0–17.0)
Immature Granulocytes: 1 %
Lymphocytes Relative: 23 %
Lymphs Abs: 1.5 K/uL (ref 0.7–4.0)
MCH: 29.6 pg (ref 26.0–34.0)
MCHC: 32.1 g/dL (ref 30.0–36.0)
MCV: 92 fL (ref 80.0–100.0)
Monocytes Absolute: 0.6 K/uL (ref 0.1–1.0)
Monocytes Relative: 9 %
Neutro Abs: 4.2 K/uL (ref 1.7–7.7)
Neutrophils Relative %: 63 %
Platelet Count: 180 K/uL (ref 150–400)
RBC: 4.77 MIL/uL (ref 4.22–5.81)
RDW: 16.6 % — ABNORMAL HIGH (ref 11.5–15.5)
WBC Count: 6.5 K/uL (ref 4.0–10.5)
nRBC: 0 % (ref 0.0–0.2)

## 2024-03-09 LAB — CMP (CANCER CENTER ONLY)
ALT: 21 U/L (ref 0–44)
AST: 29 U/L (ref 15–41)
Albumin: 4.5 g/dL (ref 3.5–5.0)
Alkaline Phosphatase: 75 U/L (ref 38–126)
Anion gap: 12 (ref 5–15)
BUN: 35 mg/dL — ABNORMAL HIGH (ref 8–23)
CO2: 28 mmol/L (ref 22–32)
Calcium: 10.4 mg/dL — ABNORMAL HIGH (ref 8.9–10.3)
Chloride: 103 mmol/L (ref 98–111)
Creatinine: 1.97 mg/dL — ABNORMAL HIGH (ref 0.61–1.24)
GFR, Estimated: 33 mL/min — ABNORMAL LOW (ref >60)
Glucose, Bld: 164 mg/dL — ABNORMAL HIGH (ref 70–99)
Potassium: 4.8 mmol/L (ref 3.5–5.1)
Sodium: 142 mmol/L (ref 135–145)
Total Bilirubin: 0.4 mg/dL (ref 0.0–1.2)
Total Protein: 7.4 g/dL (ref 6.5–8.1)

## 2024-03-09 LAB — IRON AND IRON BINDING CAPACITY (CC-WL,HP ONLY)
Iron: 75 ug/dL (ref 45–182)
Saturation Ratios: 19 % (ref 17.9–39.5)
TIBC: 388 ug/dL (ref 250–450)
UIBC: 313 ug/dL

## 2024-03-09 LAB — VITAMIN B12: Vitamin B-12: 238 pg/mL (ref 180–914)

## 2024-03-09 MED ORDER — CYANOCOBALAMIN 1000 MCG/ML IJ SOLN
1000.0000 ug | Freq: Once | INTRAMUSCULAR | Status: AC
  Administered 2024-03-09: 1000 ug via INTRAMUSCULAR
  Filled 2024-03-09: qty 1

## 2024-03-09 NOTE — Progress Notes (Signed)
 Hematology and Oncology Follow Up Visit  Richard [name] [id number] January 05, 1941 83 y.o. 03/09/2024   Principle Diagnosis:  Diffuse large cell non-Hodgkin's lymphoma (IPI = 3) - NOT double hit Pernicious anemia Iron deficiency secondary to bleeding   Past Therapy: R-CHOP - s/p cycle 8 - completed 08/2017   Current Therapy:        Vitamin B12 1 mg IM every month Xgeva  120 mg subcu q 3 months - next dose due in 05/2024 IV iron as indicated   Interim History:  Mr. Lenzen is here today for follow-up and B 12 injection. He is doing quite well and is happy to be back out on the golf course. He states that his left foot is healed and goes for clearance with ortho later today.  No falls or syncope.  No issue with fever, chills, n/v, cough, rash, dizziness, SOB, chest pain, palpitations, abdominal pain or changes in bowel or bladder habits.  No swelling in his extremities.  Neuropathy in his lower extremities unchanged from baseline.  Appetite and hydration are good. Weight is stable at 235 lbs.   ECOG Performance Status: 1 - Symptomatic but completely ambulatory  Medications:  Allergies as of 03/09/2024       Reactions   Ace Inhibitors Swelling, Other (See Comments)   Angioedema   Benazepril Swelling, Other (See Comments)   Angioedema; he is not a candidate for any angiotensin receptor blockers because of this significant allergic reaction. Because of a history of documented adverse serious drug reaction;Medi Alert bracelet  is recommended   Entresto  [sacubitril -valsartan ] Swelling, Other (See Comments)   First-in-Class Angiotensin Receptor Neprilysin Inhibitor- Med was red-flagged by the patient's pharmacy for him to NOT take!   Hctz [hydrochlorothiazide] Anaphylaxis, Swelling   Tongue and lip swelling   Aspirin  Other (See Comments)   Gastritis, can aspirin  not take 325 mg aspirin         Medication List        Accurate as of March 09, 2024  8:55 AM. If you have any  questions, ask your nurse or doctor.          acetaminophen  325 MG tablet Commonly known as: TYLENOL  Take 650 mg by mouth at bedtime as needed for moderate pain (pain score 4-6) or headache.   allopurinol  300 MG tablet Commonly known as: ZYLOPRIM  Take 450 mg by mouth daily.   aluminum hydroxide-magnesium  carbonate 95-358 MG/15ML Susp Commonly known as: GAVISCON Take 15 mLs by mouth as needed for indigestion or heartburn.   amoxicillin  500 MG capsule Commonly known as: AMOXIL  Take 500 mg by mouth 2 (two) times daily.   atorvastatin  80 MG tablet Commonly known as: LIPITOR  TAKE 1 TABLET BY MOUTH EVERYDAY AT BEDTIME   Azelastine  HCl 137 MCG/SPRAY Soln PLACE 2 SPRAYS INTO BOTH NOSTRILS AT BEDTIME AS NEEDED FOR RHINITIS OR ALLERGIES.   CO Q-10 PO Take 100 mg by mouth daily.   Eliquis  2.5 MG Tabs tablet Generic drug: apixaban  TAKE 1 TABLET BY MOUTH TWICE A DAY   esomeprazole  40 MG capsule Commonly known as: NexIUM  Take 1 capsule (40 mg total) by mouth daily.   ezetimibe  10 MG tablet Commonly known as: ZETIA  Take 1 tablet (10 mg total) by mouth daily. Pt needs office visit for further refills   Farxiga  10 MG Tabs tablet Generic drug: dapagliflozin  propanediol Take 10 mg by mouth in the morning.   fenofibrate  160 MG tablet TAKE 1 TABLET BY MOUTH EVERY DAY   folic acid  1 MG  tablet Commonly known as: FOLVITE  TAKE 2 TABLETS BY MOUTH EVERY DAY   freestyle lancets USE ONCE A DAY TO CHECK BLOOD SUGAR.   FREESTYLE LITE test strip Generic drug: glucose blood USE TO TEST BLOOD SUGAR ONCE A DAY. DX CODE: E11.9   gabapentin  100 MG capsule Commonly known as: NEURONTIN  Take 100 mg by mouth at bedtime. PRN   glimepiride  2 MG tablet Commonly known as: AMARYL  TAKE 1 TABLET (2 MG TOTAL) BY MOUTH IN THE MORNING AND AT BEDTIME.   ICY HOT ADVANCED PAIN RELIEF EX Apply 1 application  topically daily as needed (to painful sites).   metoprolol  succinate 25 MG 24 hr  tablet Commonly known as: TOPROL -XL TAKE 1 TABLET (25 MG TOTAL) BY MOUTH DAILY.   nitroGLYCERIN  0.4 MG SL tablet Commonly known as: NITROSTAT  PLACE 1 TABLET UNDER THE TONGUE EVERY 5 MINUTES AS NEEDED FOR CHEST PAIN.   potassium chloride  SA 20 MEQ tablet Commonly known as: KLOR-CON  M TAKE 2 TABLETS BY MOUTH EVERY DAY   pregabalin  100 MG capsule Commonly known as: LYRICA  TAKE 1 CAPSULE (100 MG TOTAL) BY MOUTH DAILY. AT NOON   pregabalin  150 MG capsule Commonly known as: LYRICA  TAKE 1 CAPSULE (150 MG TOTAL) BY MOUTH AT BEDTIME. (TAKE 100MG  DAILY AT NOON)   psyllium 58.6 % powder Commonly known as: METAMUCIL Take 1 packet by mouth daily as needed (for constipation- mix and drink).   torsemide  20 MG tablet Commonly known as: DEMADEX  TAKE 2 TAB BY MOUTH IN MORNING TAKE EXTRA TABLET FOR WEIGHT GAIN MORE THAN 2-3LBS IN 24HR FOR 90DAY   Trulicity  4.5 MG/0.5ML Soaj Generic drug: Dulaglutide  INJECT 4.5 MG INTO THE SKIN ONCE A WEEK.   Vascepa  1 g capsule Generic drug: icosapent  Ethyl TAKE 2 CAPSULES BY MOUTH 2 TIMES DAILY. (DAW9 VASCEPA )   VITAMIN C  PO Take 1 tablet by mouth daily with breakfast.        Allergies:  Allergies  Allergen Reactions   Ace Inhibitors Swelling and Other (See Comments)    Angioedema    Benazepril Swelling and Other (See Comments)    Angioedema; he is not a candidate for any angiotensin receptor blockers because of this significant allergic reaction. Because of a history of documented adverse serious drug reaction;Medi Alert bracelet  is recommended   Entresto  [Sacubitril -Valsartan ] Swelling and Other (See Comments)    First-in-Class Angiotensin Receptor Neprilysin Inhibitor- Med was red-flagged by the patient's pharmacy for him to NOT take!   Hctz [Hydrochlorothiazide] Anaphylaxis and Swelling    Tongue and lip swelling    Aspirin  Other (See Comments)    Gastritis, can aspirin  not take 325 mg aspirin     Past Medical History, Surgical  history, Social history, and Family History were reviewed and updated.  Review of Systems: All other 10 point review of systems is negative.   Physical Exam:  height is 5' 10 (1.778 m) and weight is 235 lb 1.3 oz (106.6 kg). His oral temperature is 97.5 F (36.4 C) (abnormal). His blood pressure is 126/68 and his pulse is 63. His respiration is 17 and oxygen  saturation is 99%.   Wt Readings from Last 3 Encounters:  03/09/24 235 lb 1.3 oz (106.6 kg)  02/24/24 231 lb (104.8 kg)  02/10/24 231 lb 12.8 oz (105.1 kg)    Ocular: Sclerae unicteric, pupils equal, round and reactive to light Ear-nose-throat: Oropharynx clear, dentition fair Lymphatic: No cervical or supraclavicular adenopathy Lungs no rales or rhonchi, good excursion bilaterally Heart regular rate and  rhythm, no murmur appreciated Abd soft, nontender, positive bowel sounds MSK no focal spinal tenderness, no joint edema Neuro: non-focal, well-oriented, appropriate affect Breasts: Deferred   Lab Results  Component Value Date   WBC 6.5 03/09/2024   HGB 14.1 03/09/2024   HCT 43.9 03/09/2024   MCV 92.0 03/09/2024   PLT 180 03/09/2024   Lab Results  Component Value Date   FERRITIN 1,439 (H) 02/10/2024   IRON 83 02/10/2024   TIBC 384 02/10/2024   UIBC 301 02/10/2024   IRONPCTSAT 22 02/10/2024   Lab Results  Component Value Date   RETICCTPCT 1.7 04/07/2023   RBC 4.77 03/09/2024   RETICCTABS 52.1 11/22/2011   No results found for: JONATHAN BONG [facility] Lab Results  Component Value Date   IGA 159 03/09/2012   Lab Results  Component Value Date   ALBUMINELP 4.2 07/27/2018   MSPIKE Not Observed 07/27/2018     Chemistry      Component Value Date/Time   NA 142 02/10/2024 0801   NA 138 02/08/2022 0811   NA 144 06/27/2017 0857   K 4.6 02/10/2024 0801   K 3.9 06/27/2017 0857   CL 102 02/10/2024 0801   CL 103 06/27/2017 0857   CO2 26 02/10/2024 0801   CO2 26 06/27/2017 0857   BUN 28 (H)  02/10/2024 0801   BUN 46 (H) 02/08/2022 0811   BUN 12 06/27/2017 0857   CREATININE 1.66 (H) 02/10/2024 0801   CREATININE 2.01 (H) 12/19/2023 1443      Component Value Date/Time   CALCIUM  9.8 02/10/2024 0801   CALCIUM  9.2 06/27/2017 0857   ALKPHOS 71 02/10/2024 0801   ALKPHOS 128 (H) 06/27/2017 0857   AST 29 02/10/2024 0801   ALT 20 02/10/2024 0801   ALT 24 06/27/2017 0857   BILITOT 0.5 02/10/2024 0801       Impression and Plan: Mr. Cinquemani is a very pleasant 83 yo caucasian gentleman with diffuse large B-cell lymphoma (not double hit lymphoma). He completed treatment in February 2019.  We will proceed with B 12 injection.  Iron studies pending. We will replace if needed.  CBC and CMP routed to Dr. ALONSO Stank with nephrology.  Follow-up in 1 month.    Lauraine Pepper, NP 9/9/20258:55 AM

## 2024-03-09 NOTE — Patient Instructions (Signed)
 Vitamin B12 Injection What is this medication? Vitamin B12 (VAHY tuh min B12) prevents and treats low vitamin B12 levels in your body. It is used in people who do not get enough vitamin B12 from their diet or when their digestive tract does not absorb enough. Vitamin B12 plays an important role in maintaining the health of your nervous system and red blood cells. This medicine may be used for other purposes; ask your health care provider or pharmacist if you have questions. COMMON BRAND NAME(S): B-12 Compliance Kit, B-12 Injection Kit, Cyomin, Dodex , LA-12, Nutri-Twelve, Physicians EZ Use B-12, Primabalt, Vitamin Deficiency Injectable System - B12 What should I tell my care team before I take this medication? They need to know if you have any of these conditions: Kidney disease Leber's disease Megaloblastic anemia An unusual or allergic reaction to cyanocobalamin , cobalt, other medications, foods, dyes, or preservatives Pregnant or trying to get pregnant Breast-feeding How should I use this medication? This medication is injected into a muscle or deeply under the skin. It is usually given in a clinic or care team's office. However, your care team may teach you how to inject yourself. Follow all instructions. Talk to your care team about the use of this medication in children. Special care may be needed. Overdosage: If you think you have taken too much of this medicine contact a poison control center or emergency room at once. NOTE: This medicine is only for you. Do not share this medicine with others. What if I miss a dose? If you are given your dose at a clinic or care team's office, call to reschedule your appointment. If you give your own injections, and you miss a dose, take it as soon as you can. If it is almost time for your next dose, take only that dose. Do not take double or extra doses. What may interact with this medication? Alcohol Colchicine This list may not describe all possible  interactions. Give your health care provider a list of all the medicines, herbs, non-prescription drugs, or dietary supplements you use. Also tell them if you smoke, drink alcohol, or use illegal drugs. Some items may interact with your medicine. What should I watch for while using this medication? Visit your care team regularly. You may need blood work done while you are taking this medication. You may need to follow a special diet. Talk to your care team. Limit your alcohol intake and avoid smoking to get the best benefit. What side effects may I notice from receiving this medication? Side effects that you should report to your care team as soon as possible: Allergic reactions--skin rash, itching, hives, swelling of the face, lips, tongue, or throat Swelling of the ankles, hands, or feet Trouble breathing Side effects that usually do not require medical attention (report to your care team if they continue or are bothersome): Diarrhea This list may not describe all possible side effects. Call your doctor for medical advice about side effects. You may report side effects to FDA at 1-800-FDA-1088. Where should I keep my medication? Keep out of the reach of children. Store at room temperature between 15 and 30 degrees C (59 and 85 degrees F). Protect from light. Throw away any unused medication after the expiration date. NOTE: This sheet is a summary. It may not cover all possible information. If you have questions about this medicine, talk to your doctor, pharmacist, or health care provider.  2024 Elsevier/Gold Standard (2021-02-27 00:00:00)

## 2024-03-09 NOTE — Progress Notes (Signed)
 Patient presents for follow-up ulcer plantar left foot.  Doing better.  Has not noticed any drainage the past few days.  No fever or chills or nausea or vomiting.  Still doing the wound care.   Physical exam:  General appearance: Pleasant, and in no acute distress. AOx3.  Vascular: Pedal pulses: DP 2/4 bilaterally, PT 1/4 bilaterally.  Mild edema lower legs bilaterally. Capillary fill time immediate bilaterally.  Neurological: Grossly intact bilaterally  Dermatologic:   Skin normal temperature bilaterally.  Skin normal color, tone, and texture bilaterally.     Diagnosis: 1.  Superficial Wagner grade 1 subfifth MTP left just barely penetrating of the dermis.  Plan: -Established office visit for evaluation and management level 3 -Ulcer almost completely healed he should just do the wound care with the Bactroban  ointment and soaks once a day for the next week or 2 area should be completely healed by then.  If it is any signs of further breakdown or infection you can call for an appointment. -Discussed proper shoes and socks to wear to protect the skin.  Return as needed

## 2024-03-11 ENCOUNTER — Ambulatory Visit: Admitting: Podiatry

## 2024-03-11 DIAGNOSIS — C8330 Diffuse large B-cell lymphoma, unspecified site: Secondary | ICD-10-CM | POA: Diagnosis not present

## 2024-03-11 DIAGNOSIS — N1832 Chronic kidney disease, stage 3b: Secondary | ICD-10-CM | POA: Diagnosis not present

## 2024-03-11 DIAGNOSIS — E1122 Type 2 diabetes mellitus with diabetic chronic kidney disease: Secondary | ICD-10-CM | POA: Diagnosis not present

## 2024-03-11 DIAGNOSIS — I5020 Unspecified systolic (congestive) heart failure: Secondary | ICD-10-CM | POA: Diagnosis not present

## 2024-03-11 DIAGNOSIS — D631 Anemia in chronic kidney disease: Secondary | ICD-10-CM | POA: Diagnosis not present

## 2024-03-11 DIAGNOSIS — I129 Hypertensive chronic kidney disease with stage 1 through stage 4 chronic kidney disease, or unspecified chronic kidney disease: Secondary | ICD-10-CM | POA: Diagnosis not present

## 2024-03-11 DIAGNOSIS — N2581 Secondary hyperparathyroidism of renal origin: Secondary | ICD-10-CM | POA: Diagnosis not present

## 2024-03-18 ENCOUNTER — Ambulatory Visit: Admitting: Family Medicine

## 2024-03-18 ENCOUNTER — Telehealth: Payer: Self-pay

## 2024-03-18 ENCOUNTER — Encounter: Payer: Self-pay | Admitting: Family Medicine

## 2024-03-18 ENCOUNTER — Other Ambulatory Visit: Payer: Self-pay | Admitting: Family Medicine

## 2024-03-18 VITALS — BP 118/68 | HR 71 | Temp 97.7°F | Resp 18 | Ht 70.0 in | Wt 232.2 lb

## 2024-03-18 DIAGNOSIS — Z7984 Long term (current) use of oral hypoglycemic drugs: Secondary | ICD-10-CM

## 2024-03-18 DIAGNOSIS — E1142 Type 2 diabetes mellitus with diabetic polyneuropathy: Secondary | ICD-10-CM | POA: Diagnosis not present

## 2024-03-18 DIAGNOSIS — E1151 Type 2 diabetes mellitus with diabetic peripheral angiopathy without gangrene: Secondary | ICD-10-CM

## 2024-03-18 DIAGNOSIS — N1832 Chronic kidney disease, stage 3b: Secondary | ICD-10-CM

## 2024-03-18 DIAGNOSIS — I10 Essential (primary) hypertension: Secondary | ICD-10-CM

## 2024-03-18 DIAGNOSIS — G6289 Other specified polyneuropathies: Secondary | ICD-10-CM | POA: Diagnosis not present

## 2024-03-18 DIAGNOSIS — Z950 Presence of cardiac pacemaker: Secondary | ICD-10-CM | POA: Diagnosis not present

## 2024-03-18 DIAGNOSIS — E785 Hyperlipidemia, unspecified: Secondary | ICD-10-CM

## 2024-03-18 DIAGNOSIS — I5033 Acute on chronic diastolic (congestive) heart failure: Secondary | ICD-10-CM

## 2024-03-18 DIAGNOSIS — N184 Chronic kidney disease, stage 4 (severe): Secondary | ICD-10-CM | POA: Insufficient documentation

## 2024-03-18 DIAGNOSIS — E1165 Type 2 diabetes mellitus with hyperglycemia: Secondary | ICD-10-CM

## 2024-03-18 DIAGNOSIS — I5042 Chronic combined systolic (congestive) and diastolic (congestive) heart failure: Secondary | ICD-10-CM

## 2024-03-18 DIAGNOSIS — I25118 Atherosclerotic heart disease of native coronary artery with other forms of angina pectoris: Secondary | ICD-10-CM

## 2024-03-18 DIAGNOSIS — Z95811 Presence of heart assist device: Secondary | ICD-10-CM | POA: Diagnosis not present

## 2024-03-18 DIAGNOSIS — R252 Cramp and spasm: Secondary | ICD-10-CM | POA: Diagnosis not present

## 2024-03-18 DIAGNOSIS — D61818 Other pancytopenia: Secondary | ICD-10-CM

## 2024-03-18 DIAGNOSIS — E1169 Type 2 diabetes mellitus with other specified complication: Secondary | ICD-10-CM | POA: Diagnosis not present

## 2024-03-18 DIAGNOSIS — J410 Simple chronic bronchitis: Secondary | ICD-10-CM

## 2024-03-18 DIAGNOSIS — I480 Paroxysmal atrial fibrillation: Secondary | ICD-10-CM

## 2024-03-18 LAB — COMPREHENSIVE METABOLIC PANEL WITH GFR
ALT: 19 U/L (ref 0–53)
AST: 22 U/L (ref 0–37)
Albumin: 4.7 g/dL (ref 3.5–5.2)
Alkaline Phosphatase: 69 U/L (ref 39–117)
BUN: 34 mg/dL — ABNORMAL HIGH (ref 6–23)
CO2: 31 meq/L (ref 19–32)
Calcium: 10.4 mg/dL (ref 8.4–10.5)
Chloride: 103 meq/L (ref 96–112)
Creatinine, Ser: 1.71 mg/dL — ABNORMAL HIGH (ref 0.40–1.50)
GFR: 36.01 mL/min — ABNORMAL LOW (ref >60.00)
Glucose, Bld: 147 mg/dL — ABNORMAL HIGH (ref 70–99)
Potassium: 4.9 meq/L (ref 3.5–5.1)
Sodium: 142 meq/L (ref 135–145)
Total Bilirubin: 0.7 mg/dL (ref 0.2–1.2)
Total Protein: 7.2 g/dL (ref 6.0–8.3)

## 2024-03-18 LAB — LDL CHOLESTEROL, DIRECT: Direct LDL: 52 mg/dL

## 2024-03-18 LAB — LIPID PANEL
Cholesterol: 182 mg/dL (ref 0–200)
HDL: 32 mg/dL — ABNORMAL LOW (ref >39.00)
NonHDL: 149.71
Total CHOL/HDL Ratio: 6
Triglycerides: 726 mg/dL — ABNORMAL HIGH (ref 0.0–149.0)
VLDL: 145.2 mg/dL — ABNORMAL HIGH (ref 0.0–40.0)

## 2024-03-18 LAB — CBC WITH DIFFERENTIAL/PLATELET
Basophils Absolute: 0 K/uL (ref 0.0–0.1)
Basophils Relative: 0.5 % (ref 0.0–3.0)
Eosinophils Absolute: 0.2 K/uL (ref 0.0–0.7)
Eosinophils Relative: 2.5 % (ref 0.0–5.0)
HCT: 44.4 % (ref 39.0–52.0)
Hemoglobin: 14.5 g/dL (ref 13.0–17.0)
Lymphocytes Relative: 25 % (ref 12.0–46.0)
Lymphs Abs: 1.6 K/uL (ref 0.7–4.0)
MCHC: 32.6 g/dL (ref 30.0–36.0)
MCV: 90.8 fl (ref 78.0–100.0)
Monocytes Absolute: 0.5 K/uL (ref 0.1–1.0)
Monocytes Relative: 8.4 % (ref 3.0–12.0)
Neutro Abs: 4 K/uL (ref 1.4–7.7)
Neutrophils Relative %: 63.6 % (ref 43.0–77.0)
Platelets: 209 K/uL (ref 150.0–400.0)
RBC: 4.89 Mil/uL (ref 4.22–5.81)
RDW: 17.4 % — ABNORMAL HIGH (ref 11.5–15.5)
WBC: 6.3 K/uL (ref 4.0–10.5)

## 2024-03-18 LAB — HEMOGLOBIN A1C: Hgb A1c MFr Bld: 7.8 % — ABNORMAL HIGH (ref 4.6–6.5)

## 2024-03-18 LAB — MICROALBUMIN / CREATININE URINE RATIO
Creatinine,U: 43.1 mg/dL
Microalb Creat Ratio: 21.4 mg/g (ref 0.0–30.0)
Microalb, Ur: 0.9 mg/dL (ref 0.0–1.9)

## 2024-03-18 LAB — GLUCOSE, POCT (MANUAL RESULT ENTRY): POC Glucose: 157 mg/dL — AB (ref 70–99)

## 2024-03-18 LAB — MAGNESIUM: Magnesium: 2.7 mg/dL — ABNORMAL HIGH (ref 1.5–2.5)

## 2024-03-18 MED ORDER — PREGABALIN 150 MG PO CAPS
150.0000 mg | ORAL_CAPSULE | Freq: Two times a day (BID) | ORAL | 1 refills | Status: DC
Start: 2024-03-18 — End: 2024-03-18

## 2024-03-18 MED ORDER — GLIMEPIRIDE 4 MG PO TABS: 4.0000 mg | ORAL_TABLET | Freq: Every day | ORAL | 3 refills | Status: DC

## 2024-03-18 MED ORDER — PREGABALIN 150 MG PO CAPS: ORAL_CAPSULE | ORAL | 1 refills | Status: DC

## 2024-03-18 NOTE — Assessment & Plan Note (Signed)
 Well controlled, no changes to meds. Encouraged heart healthy diet such as the DASH diet and exercise as tolerated.

## 2024-03-18 NOTE — Assessment & Plan Note (Signed)
 Per cardiology

## 2024-03-18 NOTE — Assessment & Plan Note (Signed)
 Inc lyrica  150 mg bid Check labs  F/u as needed

## 2024-03-18 NOTE — Assessment & Plan Note (Signed)
 Inc lyrica  150 bid

## 2024-03-18 NOTE — Telephone Encounter (Signed)
 Copied from CRM [id number]. Topic: Clinical - Prescription Issue >> Mar 18, 2024  2:33 PM Richard [name] wrote: Reason for CRM: Patient is calling because the pharmacy is not filling the pregabalin  (LYRICA ) 150 MG capsule [[id number]] because of the change in the dosage. The pharmacy asked the patient to contact the office for clarification on the instructions before filling.

## 2024-03-18 NOTE — Telephone Encounter (Signed)
 Please review sig for Lyrica .    Take 1 capsule (150 mg total) by mouth 2 (two) times daily. (Take 100mg  daily at noon)    If incorrect please send correct rx

## 2024-03-18 NOTE — Assessment & Plan Note (Addendum)
 Dm ii-----hgba1c to be checked , minimize simple carbs. Increase exercise as tolerated. Continue current meds Pt is requesting to have the pharm D calll him---  message has been sent

## 2024-03-18 NOTE — Assessment & Plan Note (Signed)
 Tolerating statin, encouraged heart healthy diet, avoid trans fats, minimize simple carbs and saturated fats. Increase exercise as tolerated

## 2024-03-18 NOTE — Assessment & Plan Note (Signed)
 Per nephrology

## 2024-03-18 NOTE — Progress Notes (Signed)
 Subjective:    Patient ID: Richard [name], male    DOB: 1941/04/13, 83 y.o.   MRN: [id number]  Chief Complaint  Patient presents with   Diabetes    Pt states sugars have been running high. Pt states watching sugar intake last night. Pt states neuropathy have gotten worse.    Follow-up    HPI Patient is in today for elevated bs and neuropathy.  Discussed the use of AI scribe software for clinical note transcription with the patient, who gave verbal consent to proceed.  History of Present Illness Richard [name] is an 83 year old male with diabetes who presents with concerns about high blood sugar and foot pain.  He is experiencing elevated blood sugar levels despite being on 4.5 mg of Trulicity , with readings of 137 mg/dL yesterday morning and 842 mg/dL this morning. He is also taking glimepiride  and Farxiga . He is attempting to manage his diet by avoiding sweets and limiting his intake to two meals a day, though he occasionally snacks on cheese crackers at night.  He experiences foot pain, particularly at night when sitting or lying down, which disrupts his sleep. He recalls a previous episode in rehab where he was given antibiotics and a medication he refers to as 'U Rotten' that helped alleviate his symptoms. Currently, he is on Lyrica , taking 100 mg at lunch and 150 mg at night. He experiences burning and cramping in his feet, especially after physical activity like golf, which he plays three times a week. He also uses non-sugar Gatorade to maintain his electrolytes.  He is retired and lives alone, frequently playing golf and maintaining a routine diet. All his family members died in their sixties, and he is the oldest living member of his family.    Past Medical History:  Diagnosis Date   Anemia    Arthritis    hips   Axillary adenopathy 02/25/2017   Bradycardia    a. holter monitor has demonstrated HRs in 30s and [place]    CAD (coronary artery disease)    a. s/p CABG  2001  b.  07/28/2017 cath:   Severe three-vessel native CAD with total occlusion of LAD, ramus intermedius, first OM and RCA, patent RIMA to PDA, LIMA to LAD, sequential SVG to ramus intermedius and first OM.     Chronic lower back pain    Diffuse large B cell lymphoma (HCC)    Diverticulosis    Esophageal stricture    GERD (gastroesophageal reflux disease)    History of gout    HTN (hypertension)    Mixed hyperlipidemia    OSA on CPAP    with 2L O2 at night   Pancytopenia (HCC)    a. related to chemo therapy for B cell lymphoma   Peptic stricture of esophagus    Presence of permanent cardiac pacemaker    sees Dr. Maya pacemaker   SCC (squamous cell carcinoma) 01/31/2021   R zygoma, EDC   SCC (squamous cell carcinoma) 01/31/2021   L post ankle, EDC   SCC (squamous cell carcinoma) 01/31/2021   L popliteal, EDC   Severe aortic stenosis    Spinal stenosis    Squamous cell carcinoma of skin 03/22/2009   Right mandible. SCCis, hypertrophic.    Squamous cell carcinoma of skin 12/25/2021   R lateral cheek, EDC   Squamous cell carcinoma of skin 12/25/2021   R postauricular neck, EDC   Squamous cell carcinoma of skin 12/25/2021   R  forearm sup, EDC   Squamous cell carcinoma of skin 12/25/2021   R wrist, EDC   Squamous cell carcinoma of skin 08/27/2023   Left thenar hand dorsum, EDC   Squamous cell carcinoma of skin 12/23/2023   Right Mid Forearm Thenar, EDC   Squamous cell carcinoma of skin 12/23/2023   Right Thenar Hand - Dorsum, EDC   Squamous cell carcinoma of skin 12/23/2023   Right Upper pretibial, EDC   Type II diabetes mellitus (HCC)    Wears dentures    partial upper    Past Surgical History:  Procedure Laterality Date   APPENDECTOMY  ~ 1952   BACK SURGERY     CATARACT EXTRACTION W/PHACO Left 11/06/2016   Procedure: CATARACT EXTRACTION PHACO AND INTRAOCULAR LENS PLACEMENT (IOC);  Surgeon: Dingeldein, Steven, MD;  Location: [facility] ORS;  Service:  Ophthalmology;  Laterality: Left;  Lot # [id number] H US : 01:09.4 AP%:25.2 CDE: 30.64   CATARACT EXTRACTION W/PHACO Right 12/04/2016   Procedure: CATARACT EXTRACTION PHACO AND INTRAOCULAR LENS PLACEMENT (IOC);  Surgeon: Dingeldein, Steven, MD;  Location: [facility] ORS;  Service: Ophthalmology;  Laterality: Right;  US  1:25.9 AP% 24.1 CDE 39.10 Fluid Pack lot # [id number]   COLONOSCOPY W/ BIOPSIES AND POLYPECTOMY  07/02/2011   CORONARY ANGIOPLASTY  07/02/1991   CORONARY ANGIOPLASTY WITH STENT PLACEMENT  05/31/1997   1   CORONARY ARTERY BYPASS GRAFT  03/01/2000   CABG X5   ECTROPION REPAIR Right 09/01/2018   Procedure: REPAIR OF ECTROPION BILATERAL upper and lower;  Surgeon: Ashley Greig HERO, MD;  Location: [facility];  Service: Ophthalmology;  Laterality: Right;  Diabetic - oral meds sleep apnea   ESOPHAGEAL DILATION  X 3-4   Dr. Sheppard Finn; last one was in the 1990's   ESOPHAGOGASTRODUODENOSCOPY     multiple   FLEXIBLE SIGMOIDOSCOPY     multiple   HYDRADENITIS EXCISION Left 02/25/2017   Procedure: EXCISION DEEP LEFT AXILLARY LYMPH NODE;  Surgeon: Gail Favorite, MD;  Location: [facility] OR;  Service: General;  Laterality: Left;   INTRAOPERATIVE TRANSTHORACIC ECHOCARDIOGRAM N/A 09/09/2017   Procedure: INTRAOPERATIVE TRANSTHORACIC ECHOCARDIOGRAM;  Surgeon: Verlin Lonni BIRCH, MD;  Location: [facility] OR;  Service: Open Heart Surgery;  Laterality: N/A;   KNEE ARTHROSCOPY Left 07/01/2009   meniscus repair   LEFT HEART CATHETERIZATION WITH CORONARY/GRAFT ANGIOGRAM N/A 03/16/2014   Procedure: LEFT HEART CATHETERIZATION WITH EL BILE;  Surgeon: Lonni BIRCH Verlin, MD;  Location: [facility] CATH LAB;  Service: Cardiovascular;  Laterality: N/A;   LUMBAR LAMINECTOMY/DECOMPRESSION MICRODISCECTOMY Right 06/17/2013   Procedure: LUMBAR LAMINECTOMY MICRODISCECTOMY L4-L5 RIGHT EXCISION OF SYNOVIAL CYST RIGHT   (1 LEVEL) RIGHT PARTIAL FACETECTOMY;  Surgeon: Tanda DELENA Heading, MD;  Location: [facility]  ORS;  Service: Orthopedics;  Laterality: Right;   MYELOGRAM  04/06/2013   lumbar, Dr Heading   ORBITAL LESION EXCISION Right 09/01/2018   Procedure: ORBITOTOMY WITHOUT BONE FLAP WITH REMOVAL OF LESION RIGHT;  Surgeon: Ashley Greig HERO, MD;  Location: [facility];  Service: Ophthalmology;  Laterality: Right;   PACEMAKER IMPLANT N/A 09/10/2017   Procedure: PACEMAKER IMPLANT;  Surgeon: Fernande Elspeth BROCKS, MD;  Location: [facility] INVASIVE CV LAB;  Service: Cardiovascular;  Laterality: N/A;   PANENDOSCOPY     PERIPHERAL VASCULAR BALLOON ANGIOPLASTY Left 03/16/2021   Procedure: PERIPHERAL VASCULAR BALLOON ANGIOPLASTY;  Surgeon: Magda Debby SAILOR, MD;  Location: [facility] INVASIVE CV LAB;  Service: Cardiovascular;  Laterality: Left;  subclavian  vein   PORTACATH PLACEMENT N/A 03/06/2017   Procedure: INSERTION PORT-A-CATH AND ASPIRATE SEROMA LEFT  AXILLA;  Surgeon: Gail Favorite, MD;  Location: [facility] OR;  Service: General;  Laterality: N/A;   RIGHT/LEFT HEART CATH AND CORONARY/GRAFT ANGIOGRAPHY N/A 07/28/2017   Procedure: RIGHT/LEFT HEART CATH AND CORONARY/GRAFT ANGIOGRAPHY;  Surgeon: Wonda Sharper, MD;  Location: [facility] INVASIVE CV LAB;  Service: Cardiovascular;  Laterality: N/A;   SHOULDER SURGERY Right 08/30/2010   screws placed; tendons tore off   SKIN CANCER EXCISION Right    neck   TONSILLECTOMY  ~ 1954   TOTAL KNEE ARTHROPLASTY Left 06/29/2019   Procedure: TOTAL KNEE ARTHROPLASTY;  Surgeon: Ernie Cough, MD;  Location: [facility] ORS;  Service: Orthopedics;  Laterality: Left;  70 mins   TRANSCATHETER AORTIC VALVE REPLACEMENT, TRANSFEMORAL N/A 09/09/2017   Procedure: TRANSCATHETER AORTIC VALVE REPLACEMENT, TRANSFEMORAL;  Surgeon: Verlin Lonni BIRCH, MD;  Location: [facility] OR;  Service: Open Heart Surgery;  Laterality: N/A;   UPPER EXTREMITY VENOGRAPHY Left 02/15/2021   Procedure: CENTRAL VENO;  Surgeon: Magda Debby SAILOR, MD;  Location: [facility] INVASIVE CV LAB;  Service: Cardiovascular;  Laterality: Left;   UPPER  EXTREMITY VENOGRAPHY N/A 03/16/2021   Procedure: UPPER EXTREMITY VENOGRAPHY;  Surgeon: Magda Debby SAILOR, MD;  Location: [facility] INVASIVE CV LAB;  Service: Cardiovascular;  Laterality: N/A;   UPPER GI ENDOSCOPY  07/02/2011   Gastritis; Dr Avram   VASECTOMY     wireless pacemaker placed      Family History  Problem Relation Age of Onset   Stroke Father    Hypertension Father    Pancreatic cancer Mother    Diabetes Maternal Grandmother    Stroke Maternal Grandmother    Heart attack Paternal Grandmother    Colon cancer Neg Hx    Esophageal cancer Neg Hx    Rectal cancer Neg Hx    Stomach cancer Neg Hx    Ulcers Neg Hx     Social History   Socioeconomic History   Marital status: Divorced    Spouse name: Not on file   Number of children: 2   Years of education: college   Highest education level: Not on file  Occupational History    Employer: RETIRED  Tobacco Use   Smoking status: Never   Smokeless tobacco: Never  Vaping Use   Vaping status: Never Used  Substance and Sexual Activity   Alcohol use: No    Alcohol/week: 0.0 standard drinks of alcohol    Comment: last drink was in 2012( 03/15/2014)   Drug use: No   Sexual activity: Not Currently  Other Topics Concern   Not on file  Social History Narrative   ** Merged History Encounter **       Divorced, lives with a roommate. 1 son one daughter 3-4 caffeinated beverages daily Right-handed. 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   Social Drivers of Corporate investment banker Strain: Low Risk  (09/30/2023)   Overall Financial Resource Strain (CARDIA)    Difficulty of Paying Living Expenses: Not hard at all  Food Insecurity: Low Risk  (01/20/2024)   Received from [facility]   Hunger Vital Sign    Within the past 12 months, you worried that your food would run out before you got money to buy  more: Never true    Within the past 12 months, the food you bought just didn't last and you didn't have money to get more. : Never true  Transportation  Needs: No Transportation Needs (01/20/2024)   Received from [facility]    In the past 12 months, has lack of reliable transportation kept you from medical appointments, meetings, work or from getting things needed for daily living? : No  Physical Activity: Sufficiently Active (09/30/2023)   Exercise Vital Sign    Days of Exercise per Week: 3 days    Minutes of Exercise per Session: 150+ min  Stress: No Stress Concern Present (09/30/2023)   [employer] of Occupational Health - Occupational Stress Questionnaire    Feeling of Stress : Not at all  Social Connections: Moderately Integrated (09/30/2023)   Social Connection and Isolation Panel    Frequency of Communication with Friends and Family: More than three times a week    Frequency of Social Gatherings with Friends and Family: More than three times a week    Attends Religious Services: More than 4 times per year    Active Member of [employer] or Organizations: Yes    Attends Engineer, structural: More than 4 times per year    Marital Status: Divorced  Intimate Partner Violence: Not At Risk (09/30/2023)   Humiliation, Afraid, Rape, and Kick questionnaire    Fear of Current or Ex-Partner: No    Emotionally Abused: No    Physically Abused: No    Sexually Abused: No    Outpatient Medications Prior to Visit  Medication Sig Dispense Refill   acetaminophen  (TYLENOL ) 325 MG tablet Take 650 mg by mouth at bedtime as needed for moderate pain (pain score 4-6) or headache.     allopurinol  (ZYLOPRIM ) 300 MG tablet Take 450 mg by mouth daily.     aluminum hydroxide-magnesium  carbonate (GAVISCON) 95-358 MG/15ML SUSP Take 15 mLs by mouth as needed for indigestion or heartburn.     amoxicillin  (AMOXIL ) 500 MG capsule Take 500 mg by mouth 2 (two) times daily.     Ascorbic Acid   (VITAMIN C  PO) Take 1 tablet by mouth daily with breakfast.     atorvastatin  (LIPITOR ) 80 MG tablet TAKE 1 TABLET BY MOUTH EVERYDAY AT BEDTIME 90 tablet 3   Azelastine  HCl 137 MCG/SPRAY SOLN PLACE 2 SPRAYS INTO BOTH NOSTRILS AT BEDTIME AS NEEDED FOR RHINITIS OR ALLERGIES. 90 mL 1   Coenzyme Q10 (CO Q-10 PO) Take 100 mg by mouth daily.     Dulaglutide  (TRULICITY ) 4.5 MG/0.5ML SOAJ INJECT 4.5 MG INTO THE SKIN ONCE A WEEK. 6 mL 1   ELIQUIS  2.5 MG TABS tablet TAKE 1 TABLET BY MOUTH TWICE A DAY 180 tablet 1   esomeprazole  (NEXIUM ) 40 MG capsule Take 1 capsule (40 mg total) by mouth daily. 30 capsule 5   ezetimibe  (ZETIA ) 10 MG tablet Take 1 tablet (10 mg total) by mouth daily. Pt needs office visit for further refills 90 tablet 1   FARXIGA  10 MG TABS tablet Take 10 mg by mouth in the morning.     fenofibrate  160 MG tablet TAKE 1 TABLET BY MOUTH EVERY DAY 90 tablet 1   folic acid  (FOLVITE ) 1 MG tablet TAKE 2 TABLETS BY MOUTH EVERY DAY 180 tablet 4   FREESTYLE LITE test strip USE TO TEST BLOOD SUGAR ONCE A DAY. DX CODE: E11.9 100 strip 12   gabapentin  (NEURONTIN ) 100 MG capsule Take 100 mg by mouth at bedtime. PRN     glimepiride  (AMARYL ) 2 MG tablet TAKE 1 TABLET (2 MG TOTAL) BY MOUTH IN THE MORNING AND AT BEDTIME. 180 tablet 1   Lancets (  FREESTYLE) lancets USE ONCE A DAY TO CHECK BLOOD SUGAR. 100 each 12   Menthol -Camphor (ICY HOT ADVANCED PAIN RELIEF EX) Apply 1 application  topically daily as needed (to painful sites).     metoprolol  succinate (TOPROL -XL) 25 MG 24 hr tablet TAKE 1 TABLET (25 MG TOTAL) BY MOUTH DAILY. 90 tablet 3   nitroGLYCERIN  (NITROSTAT ) 0.4 MG SL tablet PLACE 1 TABLET UNDER THE TONGUE EVERY 5 MINUTES AS NEEDED FOR CHEST PAIN. 25 tablet 6   potassium chloride  SA (KLOR-CON  M) 20 MEQ tablet TAKE 2 TABLETS BY MOUTH EVERY DAY 180 tablet 2   psyllium (METAMUCIL) 58.6 % powder Take 1 packet by mouth daily as needed (for constipation- mix and drink).     torsemide  (DEMADEX ) 20 MG tablet  TAKE 2 TAB BY MOUTH IN MORNING TAKE EXTRA TABLET FOR WEIGHT GAIN MORE THAN 2-3LBS IN 24HR FOR 90DAY 205 tablet 0   VASCEPA  1 g capsule TAKE 2 CAPSULES BY MOUTH 2 TIMES DAILY. (DAW9 VASCEPA ) 360 capsule 1   pregabalin  (LYRICA ) 100 MG capsule TAKE 1 CAPSULE (100 MG TOTAL) BY MOUTH DAILY. AT NOON 90 capsule 1   pregabalin  (LYRICA ) 150 MG capsule TAKE 1 CAPSULE (150 MG TOTAL) BY MOUTH AT BEDTIME. (TAKE 100MG  DAILY AT NOON) 90 capsule 1   No facility-administered medications prior to visit.    Allergies  Allergen Reactions   Ace Inhibitors Swelling and Other (See Comments)    Angioedema    Benazepril Swelling and Other (See Comments)    Angioedema; he is not a candidate for any angiotensin receptor blockers because of this significant allergic reaction. Because of a history of documented adverse serious drug reaction;Medi Alert bracelet  is recommended   Entresto  [Sacubitril -Valsartan ] Swelling and Other (See Comments)    First-in-Class Angiotensin Receptor Neprilysin Inhibitor- Med was red-flagged by the patient's pharmacy for him to NOT take!   Hctz [Hydrochlorothiazide] Anaphylaxis and Swelling    Tongue and lip swelling    Aspirin  Other (See Comments)    Gastritis, can aspirin  not take 325 mg aspirin     Review of Systems  Constitutional:  Negative for chills, fever and malaise/fatigue.  HENT:  Negative for congestion and hearing loss.   Eyes:  Negative for blurred vision and discharge.  Respiratory:  Negative for cough, sputum production and shortness of breath.   Cardiovascular:  Negative for chest pain, palpitations and leg swelling.  Gastrointestinal:  Negative for abdominal pain, blood in stool, constipation, diarrhea, heartburn, nausea and vomiting.  Genitourinary:  Negative for dysuria, frequency, hematuria and urgency.  Musculoskeletal:  Negative for back pain, falls and myalgias.  Skin:  Negative for rash.  Neurological:  Negative for dizziness, sensory change, loss of  consciousness, weakness and headaches.  Endo/Heme/Allergies:  Negative for environmental allergies. Does not bruise/bleed easily.  Psychiatric/Behavioral:  Negative for depression and suicidal ideas. The patient is not nervous/anxious and does not have insomnia.        Objective:    Physical Exam Vitals and nursing note reviewed.  Constitutional:      General: He is not in acute distress.    Appearance: Normal appearance. He is well-developed.  HENT:     Head: Normocephalic and atraumatic.  Eyes:     General: No scleral icterus.       Right eye: No discharge.        Left eye: No discharge.  Cardiovascular:     Rate and Rhythm: Normal rate and regular rhythm.     Heart sounds:  No murmur heard. Pulmonary:     Effort: Pulmonary effort is normal. No respiratory distress.     Breath sounds: Normal breath sounds.  Musculoskeletal:        General: Normal range of motion.     Cervical back: Normal range of motion and neck supple.     Right lower leg: No edema.     Left lower leg: No edema.  Skin:    General: Skin is warm and dry.  Neurological:     Mental Status: He is alert and oriented to person, place, and time.  Psychiatric:        Mood and Affect: Mood normal.        Behavior: Behavior normal.        Thought Content: Thought content normal.        Judgment: Judgment normal.     BP 118/68 (BP Location: Left Arm, Patient Position: Sitting, Cuff Size: Large)   Pulse 71   Temp 97.7 F (36.5 C) (Oral)   Resp 18   Ht 5' 10 (1.778 m)   Wt 232 lb 3.2 oz (105.3 kg)   SpO2 94%   BMI 33.32 kg/m  Wt Readings from Last 3 Encounters:  03/18/24 232 lb 3.2 oz (105.3 kg)  03/09/24 235 lb 1.3 oz (106.6 kg)  02/24/24 231 lb (104.8 kg)    Diabetic Foot Exam - Simple   No data filed    Lab Results  Component Value Date   WBC 6.5 03/09/2024   HGB 14.1 03/09/2024   HCT 43.9 03/09/2024   PLT 180 03/09/2024   GLUCOSE 164 (H) 03/09/2024   CHOL 174 12/19/2023   TRIG 788 (H)  12/19/2023   HDL 30 (L) 12/19/2023   LDLDIRECT 78.0 11/26/2022   LDLCALC  12/19/2023     Comment:     . LDL cholesterol not calculated. Triglyceride levels greater than 400 mg/dL invalidate calculated LDL results. . Reference range: <100 . Desirable range <100 mg/dL for primary prevention;   <70 mg/dL for patients with CHD or diabetic patients  with > or = 2 CHD risk factors. SABRA LDL-C is now calculated using the Martin-Hopkins  calculation, which is a validated novel method providing  better accuracy than the Friedewald equation in the  estimation of LDL-C.  Gladis APPLETHWAITE et al. SANDREA. 7986;689(80): 2061-2068  (http://education.[facility].com/faq/FAQ164)    ALT 21 03/09/2024   AST 29 03/09/2024   NA 142 03/09/2024   K 4.8 03/09/2024   CL 103 03/09/2024   CREATININE 1.97 (H) 03/09/2024   BUN 35 (H) 03/09/2024   CO2 28 03/09/2024   TSH 2.450 07/27/2018   PSA 1.03 07/26/2021   INR 1.2 04/14/2023   HGBA1C 8.0 (H) 12/19/2023   MICROALBUR 0.2 12/19/2023    Lab Results  Component Value Date   TSH 2.450 07/27/2018   Lab Results  Component Value Date   WBC 6.5 03/09/2024   HGB 14.1 03/09/2024   HCT 43.9 03/09/2024   MCV 92.0 03/09/2024   PLT 180 03/09/2024   Lab Results  Component Value Date   NA 142 03/09/2024   K 4.8 03/09/2024   CO2 28 03/09/2024   GLUCOSE 164 (H) 03/09/2024   BUN 35 (H) 03/09/2024   CREATININE 1.97 (H) 03/09/2024   BILITOT 0.4 03/09/2024   ALKPHOS 75 03/09/2024   AST 29 03/09/2024   ALT 21 03/09/2024   PROT 7.4 03/09/2024   ALBUMIN  4.5 03/09/2024   CALCIUM  10.4 (H) 03/09/2024   ANIONGAP 12  03/09/2024   EGFR 32 (L) 12/19/2023   GFR 41.43 (L) 04/10/2023   Lab Results  Component Value Date   CHOL 174 12/19/2023   Lab Results  Component Value Date   HDL 30 (L) 12/19/2023   Lab Results  Component Value Date   [facility]  12/19/2023     Comment:     . LDL cholesterol not calculated. Triglyceride levels greater than 400 mg/dL  invalidate calculated LDL results. . Reference range: <100 . Desirable range <100 mg/dL for primary prevention;   <70 mg/dL for patients with CHD or diabetic patients  with > or = 2 CHD risk factors. SABRA LDL-C is now calculated using the Martin-Hopkins  calculation, which is a validated novel method providing  better accuracy than the Friedewald equation in the  estimation of LDL-C.  Gladis APPLETHWAITE et al. SANDREA. 7986;689(80): 2061-2068  (http://education.[facility].com/faq/FAQ164)    Lab Results  Component Value Date   TRIG 788 (H) 12/19/2023   Lab Results  Component Value Date   CHOLHDL 5.8 (H) 12/19/2023   Lab Results  Component Value Date   HGBA1C 8.0 (H) 12/19/2023       Assessment & Plan:  Uncontrolled type 2 diabetes mellitus with hyperglycemia (HCC) -     POCT glucose (manual entry) -     Lipid panel -     CBC with Differential/Platelet -     Comprehensive metabolic panel with GFR -     Hemoglobin A1c -     Microalbumin / creatinine urine ratio  Type 2 diabetes mellitus with diabetic peripheral angiopathy without gangrene, without long-term current use of insulin  (HCC) Assessment & Plan: Dm ii-----hgba1c to be checked , minimize simple carbs. Increase exercise as tolerated. Continue current meds Pt is requesting to have the pharm D calll him---  message has been sent    Orders: -     Pregabalin ; Take 1 capsule (150 mg total) by mouth 2 (two) times daily. (Take 100mg  daily at noon)  Dispense: 180 capsule; Refill: 1  Stage 3b chronic kidney disease (CKD) (HCC)  Chronic combined systolic and diastolic heart failure (HCC) Assessment & Plan: Per cardiology   Coronary artery disease involving native coronary artery of native heart with other form of angina pectoris (HCC)  Primary hypertension Assessment & Plan: Well controlled, no changes to meds. Encouraged heart healthy diet such as the DASH diet and exercise as tolerated.    Orders: -     Lipid panel -      CBC with Differential/Platelet -     Comprehensive metabolic panel with GFR  Leg cramps -     Magnesium   Essential hypertension Assessment & Plan: Well controlled, no changes to meds. Encouraged heart healthy diet such as the DASH diet and exercise as tolerated.     Hyperlipidemia associated with type 2 diabetes mellitus (HCC) Assessment & Plan: Tolerating statin, encouraged heart healthy diet, avoid trans fats, minimize simple carbs and saturated fats. Increase exercise as tolerated    Other polyneuropathy Assessment & Plan: Inc lyrica  150 bid    Presence of heart assist device (HCC)  Stage 4 chronic kidney disease (HCC) Assessment & Plan: Per nephrology   Pancytopenia (HCC)  Acute on chronic diastolic CHF (congestive heart failure), NYHA class 2 (HCC)  Simple chronic bronchitis (HCC)  Diabetic peripheral neuropathy associated with type 2 diabetes mellitus (HCC) Assessment & Plan: Inc lyrica  150 mg bid Check labs  F/u as needed    Cardiac pacemaker in situ Assessment &  Plan: Per cardiology   Paroxysmal atrial fibrillation [facility]) Assessment & Plan: Per cardiology   Assessment and Plan Assessment & Plan Type 2 diabetes mellitus with hyperglycemia and diabetic neuropathy of the feet   Chronic type 2 diabetes mellitus persists with elevated blood glucose levels of 137 mg/dL and 842 mg/dL, despite maximum doses of Trulicity , Farxiga , and current glimepiride . Neuropathy symptoms, including burning and cramping in the feet, are likely exacerbated by uncontrolled blood glucose. Concerns include high morning blood glucose and persistent neuropathic pain. Increase glimepiride  to 4 mg daily and monitor blood glucose closely to prevent hypoglycemia. Adjust Lyrica  to 150 mg twice daily, retaining the current stock of 100 mg for future adjustments. Encourage monitoring blood glucose at various times, including afternoon and night, to ensure levels remain stable. Advise  dietary modifications to reduce evening carbohydrate intake. Order labs to evaluate current blood glucose control and adjust treatment as needed.    Everard Interrante R Lowne Chase, DO

## 2024-03-19 ENCOUNTER — Ambulatory Visit: Payer: Self-pay | Admitting: Family Medicine

## 2024-03-19 DIAGNOSIS — E785 Hyperlipidemia, unspecified: Secondary | ICD-10-CM

## 2024-03-29 ENCOUNTER — Ambulatory Visit: Admitting: Dermatology

## 2024-03-29 DIAGNOSIS — L578 Other skin changes due to chronic exposure to nonionizing radiation: Secondary | ICD-10-CM | POA: Diagnosis not present

## 2024-03-29 DIAGNOSIS — C44329 Squamous cell carcinoma of skin of other parts of face: Secondary | ICD-10-CM | POA: Diagnosis not present

## 2024-03-29 DIAGNOSIS — D492 Neoplasm of unspecified behavior of bone, soft tissue, and skin: Secondary | ICD-10-CM | POA: Diagnosis not present

## 2024-03-29 DIAGNOSIS — Z85828 Personal history of other malignant neoplasm of skin: Secondary | ICD-10-CM | POA: Diagnosis not present

## 2024-03-29 DIAGNOSIS — C44320 Squamous cell carcinoma of skin of unspecified parts of face: Secondary | ICD-10-CM

## 2024-03-29 DIAGNOSIS — W908XXA Exposure to other nonionizing radiation, initial encounter: Secondary | ICD-10-CM | POA: Diagnosis not present

## 2024-03-29 DIAGNOSIS — L570 Actinic keratosis: Secondary | ICD-10-CM | POA: Insufficient documentation

## 2024-03-29 DIAGNOSIS — D099 Carcinoma in situ, unspecified: Secondary | ICD-10-CM | POA: Insufficient documentation

## 2024-03-29 DIAGNOSIS — D0471 Carcinoma in situ of skin of right lower limb, including hip: Secondary | ICD-10-CM

## 2024-03-29 HISTORY — DX: Carcinoma in situ, unspecified: D09.9

## 2024-03-29 HISTORY — DX: Actinic keratosis: L57.0

## 2024-03-29 NOTE — Patient Instructions (Signed)
 Wound Care Instructions  Cleanse wound gently with soap and water once a day then pat dry with clean gauze. Apply a thin coat of Petrolatum (petroleum jelly, Vaseline) over the wound (unless you have an allergy to this). We recommend that you use a new, sterile tube of Vaseline. Do not pick or remove scabs. Do not remove the yellow or white healing tissue from the base of the wound.  Cover the wound with fresh, clean, nonstick gauze and secure with paper tape. You may use Band-Aids in place of gauze and tape if the wound is small enough, but would recommend trimming much of the tape off as there is often too much. Sometimes Band-Aids can irritate the skin.  You should call the office for your biopsy report after 1 week if you have not already been contacted.  If you experience any problems, such as abnormal amounts of bleeding, swelling, significant bruising, significant pain, or evidence of infection, please call the office immediately.  FOR ADULT SURGERY PATIENTS: If you need something for pain relief you may take 1 extra strength Tylenol  (acetaminophen ) AND 2 Ibuprofen  (200mg  each) together every 4 hours as needed for pain. (do not take these if you are allergic to them or if you have a reason you should not take them.) Typically, you may only need pain medication for 1 to 3 days.      Cryotherapy Aftercare  Wash gently with soap and water everyday.   Apply Vaseline and Band-Aid daily until healed.       Due to recent changes in healthcare laws, you may see results of your pathology and/or laboratory studies on MyChart before the doctors have had a chance to review them. We understand that in some cases there may be results that are confusing or concerning to you. Please understand that not all results are received at the same time and often the doctors may need to interpret multiple results in order to provide you with the best plan of care or course of treatment. Therefore, we ask  that you please give us  2 business days to thoroughly review all your results before contacting the office for clarification. Should we see a critical lab result, you will be contacted sooner.   If You Need Anything After Your Visit  If you have any questions or concerns for your doctor, please call our main line at [phone] and press option 4 to reach your doctor's medical assistant. If no one answers, please leave a voicemail as directed and we will return your call as soon as possible. Messages left after 4 pm will be answered the following business day.   You may also send us  a message via MyChart. We typically respond to MyChart messages within 1-2 business days.  For prescription refills, please ask your pharmacy to contact our office. Our fax number is [phone].  If you have an urgent issue when the clinic is closed that cannot wait until the next business day, you can page your doctor at the number below.    Please note that while we do our best to be available for urgent issues outside of office hours, we are not available 24/7.   If you have an urgent issue and are unable to reach us , you may choose to seek medical care at your doctor's office, retail clinic, urgent care center, or emergency room.  If you have a medical emergency, please immediately call 911 or go to the emergency department.  Pager Numbers  -  Dr. Hester: [phone]  - Dr. Jackquline: [phone]  - Dr. Claudene: [phone]   - Dr. Raymund: [phone]  In the event of inclement weather, please call our main line at [phone] for an update on the status of any delays or closures.  Dermatology Medication Tips: Please keep the boxes that topical medications come in in order to help keep track of the instructions about where and how to use these. Pharmacies typically print the medication instructions only on the boxes and not directly on the medication tubes.   If your medication is too expensive,  please contact our office at [phone] option 4 or send us  a message through MyChart.   We are unable to tell what your co-pay for medications will be in advance as this is different depending on your insurance coverage. However, we may be able to find a substitute medication at lower cost or fill out paperwork to get insurance to cover a needed medication.   If a prior authorization is required to get your medication covered by your insurance company, please allow us  1-2 business days to complete this process.  Drug prices often vary depending on where the prescription is filled and some pharmacies may offer cheaper prices.  The website www.goodrx.com contains coupons for medications through different pharmacies. The prices here do not account for what the cost may be with help from insurance (it may be cheaper with your insurance), but the website can give you the price if you did not use any insurance.  - You can print the associated coupon and take it with your prescription to the pharmacy.  - You may also stop by our office during regular business hours and pick up a GoodRx coupon card.  - If you need your prescription sent electronically to a different pharmacy, notify our office through [facility] or by phone at [phone] option 4.     Si Usted Necesita Algo Despus de Su Visita  Tambin puede enviarnos un mensaje a travs de Clinical cytogeneticist. Por lo general respondemos a los mensajes de MyChart en el transcurso de 1 a 2 das hbiles.  Para renovar recetas, por favor pida a su farmacia que se ponga en contacto con nuestra oficina. Randi lakes de fax es [place] [phone].  Si tiene un asunto urgente cuando la clnica est cerrada y que no puede esperar hasta el siguiente da hbil, puede llamar/localizar a su doctor(a) al nmero que aparece a continuacin.   Por favor, tenga en cuenta que aunque hacemos todo lo posible para estar disponibles para asuntos urgentes fuera del  horario de [place], no estamos disponibles las 24 horas del da, los 7 [address]  [address] de la [place].   Si tiene un problema urgente y no puede comunicarse con nosotros, puede optar por buscar atencin mdica  en el consultorio de su doctor(a), en una clnica privada, en un centro de atencin urgente o en una sala de emergencias.  Si tiene Engineer, drilling, por favor llame inmediatamente al 911 o vaya a la sala de emergencias.  Nmeros de bper  - Dr. Hester: [phone]  - Dra. Jackquline: [phone]  - Dr. Claudene: [phone]  - Dra. Kitts: [phone]  En caso de inclemencias del [place], por favor llame a nuestra lnea principal al [phone] para una actualizacin sobre el estado de cualquier retraso o cierre.  Consejos para la medicacin en dermatologa: Por favor, guarde las cajas en las que vienen los medicamentos de uso tpico para ayudarle a seguir las instrucciones sobre dnde  y cmo usarlos. Las farmacias generalmente imprimen las instrucciones del medicamento slo en las cajas y no directamente en los tubos del [place].   Si su medicamento es muy caro, por favor, pngase en contacto con landry rieger llamando al [phone] y presione la opcin 4 o envenos un mensaje a travs de Clinical cytogeneticist.   No podemos decirle cul ser su copago por los medicamentos por adelantado ya que esto es diferente dependiendo de la cobertura de su seguro. Sin embargo, es posible que podamos encontrar un medicamento sustituto a Audiological scientist un formulario para que el seguro cubra el medicamento que se considera necesario.   Si se requiere una autorizacin previa para que su compaa de seguros malta su medicamento, por favor permtanos de 1 a 2 das hbiles para completar este proceso.  Los precios de los medicamentos varan con frecuencia dependiendo del Environmental consultant de dnde se surte la receta y alguna farmacias pueden ofrecer precios ms baratos.  El sitio web www.goodrx.com tiene cupones para  medicamentos de Health and safety inspector. Los precios aqu no tienen en cuenta lo que podra costar con la ayuda del seguro (puede ser ms barato con su seguro), pero el sitio web puede darle el precio si no utiliz Tourist information centre manager.  - Puede imprimir el cupn correspondiente y llevarlo con su receta a la farmacia.  - Tambin puede pasar por nuestra oficina durante el horario de atencin regular y Education officer, museum una tarjeta de cupones de GoodRx.  - Si necesita que su receta se enve electrnicamente a una farmacia diferente, informe a nuestra oficina a travs de MyChart de [place] o por telfono llamando al [phone] y presione la opcin 4.

## 2024-03-29 NOTE — Progress Notes (Unsigned)
 Follow-Up Visit   Subjective  Richard [name] is a 83 y.o. male who presents for the following: AK 97m f/u, nose, Recheck bx proven SCCs of the R mid forearm thenar, R thenar hand dorsum, R upper pretibial, bx and EDC to all on 12/23/23, check spot L face, non healing, check spot R upper arm and R thigh, picks at   The following portions of the chart were reviewed this encounter and updated as appropriate: medications, allergies, medical history  Review of Systems:  No other skin or systemic complaints except as noted in HPI or Assessment and Plan.  Objective  Well appearing patient in no apparent distress; mood and affect are within normal limits.   A focused examination was performed of the following areas: Face, right hand, right leg  Relevant exam findings are noted in the Assessment and Plan.  L preauricular 0.6cm pink firm pap  R upper arm 7.58mm keratotic pap  R ant thigh 0.7cm keratotic pap  R thigh x 1, R thumb x 1, R forearm x 6, L elbow x 1, L forearm x 6 (15) Hyperkeratotic scaly macules  Assessment & Plan   HISTORY OF SQUAMOUS CELL CARCINOMA OF THE SKIN - No evidence of recurrence today- R mid forearm thenar, R thenar hand dorsum, R upper pretibial - Recommend regular full body skin exams - Recommend daily broad spectrum sunscreen SPF 30+ to sun-exposed areas, reapply every 2 hours as needed.  - Call if any new or changing lesions are noted between office visits  ACTINIC DAMAGE - chronic, secondary to cumulative UV radiation exposure/sun exposure over time - diffuse scaly erythematous macules with underlying dyspigmentation - Recommend daily broad spectrum sunscreen SPF 30+ to sun-exposed areas, reapply every 2 hours as needed.  - Recommend staying in the shade or wearing long sleeves, sun glasses (UVA+UVB protection) and wide brim hats (4-inch brim around the entire circumference of the hat). - Call for new or changing lesions.  NEOPLASM OF SKIN (3) L  preauricular Epidermal / dermal shaving  Lesion diameter (cm):  0.6 Informed consent: discussed and consent obtained   Patient was prepped and draped in usual sterile fashion: area prepped with alcohol. Anesthesia: the lesion was anesthetized in a standard fashion   Anesthetic:  1% lidocaine  w/ epinephrine  1-100,000 buffered w/ 8.4% NaHCO3 Instrument used: flexible razor blade   Hemostasis achieved with: pressure, aluminum chloride and electrodesiccation   Outcome: patient tolerated procedure well    Destruction of lesion  Destruction method: electrodesiccation and curettage   Informed consent: discussed and consent obtained   Curettage performed in three different directions: Yes   Electrodesiccation performed over the curetted area: Yes   Final wound size (cm):  0.7 Hemostasis achieved with:  pressure, aluminum chloride and electrodesiccation Outcome: patient tolerated procedure well with no complications   Post-procedure details: wound care instructions given   Additional details:  Mupirocin  ointment and Bandaid applied   Specimen 1 - Surgical pathology Differential Diagnosis: R/O SCC , BCC  Check Margins: yes 0.6cm pink firm pap EDC R upper arm Epidermal / dermal shaving  Lesion diameter (cm):  0.7 Informed consent: discussed and consent obtained   Patient was prepped and draped in usual sterile fashion: area prepped with alcohol. Anesthesia: the lesion was anesthetized in a standard fashion   Anesthetic:  1% lidocaine  w/ epinephrine  1-100,000 buffered w/ 8.4% NaHCO3 Instrument used: flexible razor blade   Hemostasis achieved with: pressure, aluminum chloride and electrodesiccation   Outcome: patient tolerated procedure  well    Destruction of lesion  Destruction method: electrodesiccation and curettage   Informed consent: discussed and consent obtained   Curettage performed in three different directions: Yes   Electrodesiccation performed over the curetted area: Yes    Final wound size (cm):  0.9 Hemostasis achieved with:  pressure, aluminum chloride and electrodesiccation Outcome: patient tolerated procedure well with no complications   Post-procedure details: wound care instructions given   Additional details:  Mupirocin  ointment and Bandaid applied   Specimen 2 - Surgical pathology Differential Diagnosis: Hypertrophic AK r/o SCC  Check Margins: yes 7.49mm keratotic pap EDC R ant thigh Epidermal / dermal shaving  Lesion diameter (cm):  0.7 Informed consent: discussed and consent obtained   Patient was prepped and draped in usual sterile fashion: area prepped with alcohol. Anesthesia: the lesion was anesthetized in a standard fashion   Anesthetic:  1% lidocaine  w/ epinephrine  1-100,000 buffered w/ 8.4% NaHCO3 Instrument used: flexible razor blade   Hemostasis achieved with: pressure, aluminum chloride and electrodesiccation   Outcome: patient tolerated procedure well    Destruction of lesion  Destruction method: electrodesiccation and curettage   Informed consent: discussed and consent obtained   Curettage performed in three different directions: Yes   Electrodesiccation performed over the curetted area: Yes   Final wound size (cm):  0.7 Hemostasis achieved with:  pressure, aluminum chloride and electrodesiccation Outcome: patient tolerated procedure well with no complications   Post-procedure details: wound care instructions given   Additional details:  Mupirocin  ointment and Bandaid applied   Specimen 3 - Surgical pathology Differential Diagnosis: Hypertrophic AK r/o SCC  Check Margins: yes 0.7cm keratotic pap EDC today HYPERTROPHIC ACTINIC KERATOSIS (15) R thigh x 1, R thumb x 1, R forearm x 6, L elbow x 1, L forearm x 6 (15) Destruction of lesion - R thigh x 1, R thumb x 1, R forearm x 6, L elbow x 1, L forearm x 6 (15)  Destruction method: cryotherapy   Informed consent: discussed and consent obtained   Lesion destroyed using  liquid nitrogen: Yes   Region frozen until ice ball extended beyond lesion: Yes   Outcome: patient tolerated procedure well with no complications   Post-procedure details: wound care instructions given   Additional details:  Prior to procedure, discussed risks of blister formation, small wound, skin dyspigmentation, or rare scar following cryotherapy. Recommend Vaseline ointment to treated areas while healing.    Return in about 3 months (around 06/28/2024) for AK f/u, Bx f/u, sun exposed areas.  I, Grayce Saunas, RMA, am acting as scribe for Rexene Rattler, MD .   Documentation: I have reviewed the above documentation for accuracy and completeness, and I agree with the above.  Rexene Rattler, MD

## 2024-03-30 ENCOUNTER — Ambulatory Visit: Payer: Self-pay | Admitting: Dermatology

## 2024-03-30 LAB — SURGICAL PATHOLOGY

## 2024-03-31 ENCOUNTER — Encounter: Payer: Self-pay | Admitting: Dermatology

## 2024-03-31 NOTE — Telephone Encounter (Signed)
 Patient advised of BX results. aw

## 2024-03-31 NOTE — Telephone Encounter (Signed)
-----   Message from Rexene Rattler sent at 03/30/2024  7:48 PM EDT ----- 1. Skin, L preauricular :       WELL DIFFERENTIATED SQUAMOUS CELL CARCINOMA, DEEP MARGIN INVOLVED  2. Skin, R upper arm :       HYPERPLASTIC ACTINIC KERATOSIS WITH HPV RELATED CHANGES  3. Skin, R ant thigh :       SQUAMOUS CELL CARCINOMA IN SITU, HYPERTROPHIC, DEEP MARGIN INVOLVED  SCC skin cancer- already treated with EDC at time of biopsy  Thick precancer with wart features-  already treated with EDC at time of biopsy SCCIS skin cancer- already treated with EDC at time of biopsy    - please call patient ----- Message ----- From: Interface, Lab In [facility] [place] [facility] Sent: 03/30/2024   6:13 PM EDT To: Rexene Rattler, MD

## 2024-03-31 NOTE — Telephone Encounter (Signed)
 Left pt msg to call for bx results/sh

## 2024-04-06 ENCOUNTER — Ambulatory Visit

## 2024-04-06 ENCOUNTER — Ambulatory Visit: Admitting: Family

## 2024-04-06 ENCOUNTER — Other Ambulatory Visit

## 2024-04-06 DIAGNOSIS — Z792 Long term (current) use of antibiotics: Secondary | ICD-10-CM | POA: Diagnosis not present

## 2024-04-06 DIAGNOSIS — Z952 Presence of prosthetic heart valve: Secondary | ICD-10-CM | POA: Diagnosis not present

## 2024-04-06 DIAGNOSIS — A498 Other bacterial infections of unspecified site: Secondary | ICD-10-CM | POA: Diagnosis not present

## 2024-04-06 DIAGNOSIS — Z950 Presence of cardiac pacemaker: Secondary | ICD-10-CM | POA: Diagnosis not present

## 2024-04-13 ENCOUNTER — Inpatient Hospital Stay

## 2024-04-13 ENCOUNTER — Encounter: Payer: Self-pay | Admitting: Family

## 2024-04-13 ENCOUNTER — Inpatient Hospital Stay: Attending: Hematology & Oncology

## 2024-04-13 ENCOUNTER — Inpatient Hospital Stay (HOSPITAL_BASED_OUTPATIENT_CLINIC_OR_DEPARTMENT_OTHER): Admitting: Family

## 2024-04-13 VITALS — BP 120/61 | HR 64 | Temp 97.7°F | Resp 18 | Ht 70.0 in | Wt 234.1 lb

## 2024-04-13 DIAGNOSIS — D519 Vitamin B12 deficiency anemia, unspecified: Secondary | ICD-10-CM

## 2024-04-13 DIAGNOSIS — C8332 Diffuse large B-cell lymphoma, intrathoracic lymph nodes: Secondary | ICD-10-CM | POA: Insufficient documentation

## 2024-04-13 DIAGNOSIS — D518 Other vitamin B12 deficiency anemias: Secondary | ICD-10-CM

## 2024-04-13 DIAGNOSIS — Z950 Presence of cardiac pacemaker: Secondary | ICD-10-CM | POA: Insufficient documentation

## 2024-04-13 DIAGNOSIS — D510 Vitamin B12 deficiency anemia due to intrinsic factor deficiency: Secondary | ICD-10-CM | POA: Insufficient documentation

## 2024-04-13 DIAGNOSIS — D511 Vitamin B12 deficiency anemia due to selective vitamin B12 malabsorption with proteinuria: Secondary | ICD-10-CM

## 2024-04-13 DIAGNOSIS — D509 Iron deficiency anemia, unspecified: Secondary | ICD-10-CM | POA: Diagnosis not present

## 2024-04-13 DIAGNOSIS — Z79899 Other long term (current) drug therapy: Secondary | ICD-10-CM | POA: Insufficient documentation

## 2024-04-13 DIAGNOSIS — E611 Iron deficiency: Secondary | ICD-10-CM | POA: Diagnosis not present

## 2024-04-13 DIAGNOSIS — D500 Iron deficiency anemia secondary to blood loss (chronic): Secondary | ICD-10-CM

## 2024-04-13 DIAGNOSIS — G629 Polyneuropathy, unspecified: Secondary | ICD-10-CM | POA: Insufficient documentation

## 2024-04-13 LAB — CMP (CANCER CENTER ONLY)
ALT: 21 U/L (ref 0–44)
AST: 30 U/L (ref 15–41)
Albumin: 4.6 g/dL (ref 3.5–5.0)
Alkaline Phosphatase: 82 U/L (ref 38–126)
Anion gap: 13 (ref 5–15)
BUN: 42 mg/dL — ABNORMAL HIGH (ref 8–23)
CO2: 26 mmol/L (ref 22–32)
Calcium: 9.8 mg/dL (ref 8.9–10.3)
Chloride: 101 mmol/L (ref 98–111)
Creatinine: 1.81 mg/dL — ABNORMAL HIGH (ref 0.61–1.24)
GFR, Estimated: 36 mL/min — ABNORMAL LOW (ref >60)
Glucose, Bld: 227 mg/dL — ABNORMAL HIGH (ref 70–99)
Potassium: 4.6 mmol/L (ref 3.5–5.1)
Sodium: 140 mmol/L (ref 135–145)
Total Bilirubin: 0.4 mg/dL (ref 0.0–1.2)
Total Protein: 7.3 g/dL (ref 6.5–8.1)

## 2024-04-13 LAB — CBC WITH DIFFERENTIAL (CANCER CENTER ONLY)
Abs Immature Granulocytes: 0.04 K/uL (ref 0.00–0.07)
Basophils Absolute: 0 K/uL (ref 0.0–0.1)
Basophils Relative: 1 %
Eosinophils Absolute: 0.1 K/uL (ref 0.0–0.5)
Eosinophils Relative: 2 %
HCT: 42.9 % (ref 39.0–52.0)
Hemoglobin: 14.1 g/dL (ref 13.0–17.0)
Immature Granulocytes: 1 %
Lymphocytes Relative: 23 %
Lymphs Abs: 1.5 K/uL (ref 0.7–4.0)
MCH: 29.8 pg (ref 26.0–34.0)
MCHC: 32.9 g/dL (ref 30.0–36.0)
MCV: 90.7 fL (ref 80.0–100.0)
Monocytes Absolute: 0.5 K/uL (ref 0.1–1.0)
Monocytes Relative: 7 %
Neutro Abs: 4.3 K/uL (ref 1.7–7.7)
Neutrophils Relative %: 66 %
Platelet Count: 206 K/uL (ref 150–400)
RBC: 4.73 MIL/uL (ref 4.22–5.81)
RDW: 15.9 % — ABNORMAL HIGH (ref 11.5–15.5)
WBC Count: 6.5 K/uL (ref 4.0–10.5)
nRBC: 0 % (ref 0.0–0.2)

## 2024-04-13 LAB — IRON AND IRON BINDING CAPACITY (CC-WL,HP ONLY)
Iron: 70 ug/dL (ref 45–182)
Saturation Ratios: 18 % (ref 17.9–39.5)
TIBC: 392 ug/dL (ref 250–450)
UIBC: 322 ug/dL

## 2024-04-13 LAB — FERRITIN: Ferritin: 1354 ng/mL — ABNORMAL HIGH (ref 24–336)

## 2024-04-13 LAB — VITAMIN B12: Vitamin B-12: 242 pg/mL (ref 180–914)

## 2024-04-13 MED ORDER — CYANOCOBALAMIN 1000 MCG/ML IJ SOLN
1000.0000 ug | Freq: Once | INTRAMUSCULAR | Status: AC
  Administered 2024-04-13: 1000 ug via INTRAMUSCULAR
  Filled 2024-04-13: qty 1

## 2024-04-13 NOTE — Patient Instructions (Signed)
 Vitamin B12 Injection What is this medication? Vitamin B12 (VAHY tuh min B12) prevents and treats low vitamin B12 levels in your body. It is used in people who do not get enough vitamin B12 from their diet or when their digestive tract does not absorb enough. Vitamin B12 plays an important role in maintaining the health of your nervous system and red blood cells. This medicine may be used for other purposes; ask your health care provider or pharmacist if you have questions. COMMON BRAND NAME(S): B-12 Compliance Kit, B-12 Injection Kit, Cyomin, Dodex , LA-12, Nutri-Twelve, Physicians EZ Use B-12, Primabalt, Vitamin Deficiency Injectable System - B12 What should I tell my care team before I take this medication? They need to know if you have any of these conditions: Kidney disease Leber's disease Megaloblastic anemia An unusual or allergic reaction to cyanocobalamin , cobalt, other medications, foods, dyes, or preservatives Pregnant or trying to get pregnant Breast-feeding How should I use this medication? This medication is injected into a muscle or deeply under the skin. It is usually given in a clinic or care team's office. However, your care team may teach you how to inject yourself. Follow all instructions. Talk to your care team about the use of this medication in children. Special care may be needed. Overdosage: If you think you have taken too much of this medicine contact a poison control center or emergency room at once. NOTE: This medicine is only for you. Do not share this medicine with others. What if I miss a dose? If you are given your dose at a clinic or care team's office, call to reschedule your appointment. If you give your own injections, and you miss a dose, take it as soon as you can. If it is almost time for your next dose, take only that dose. Do not take double or extra doses. What may interact with this medication? Alcohol Colchicine This list may not describe all possible  interactions. Give your health care provider a list of all the medicines, herbs, non-prescription drugs, or dietary supplements you use. Also tell them if you smoke, drink alcohol, or use illegal drugs. Some items may interact with your medicine. What should I watch for while using this medication? Visit your care team regularly. You may need blood work done while you are taking this medication. You may need to follow a special diet. Talk to your care team. Limit your alcohol intake and avoid smoking to get the best benefit. What side effects may I notice from receiving this medication? Side effects that you should report to your care team as soon as possible: Allergic reactions--skin rash, itching, hives, swelling of the face, lips, tongue, or throat Swelling of the ankles, hands, or feet Trouble breathing Side effects that usually do not require medical attention (report to your care team if they continue or are bothersome): Diarrhea This list may not describe all possible side effects. Call your doctor for medical advice about side effects. You may report side effects to FDA at 1-800-FDA-1088. Where should I keep my medication? Keep out of the reach of children. Store at room temperature between 15 and 30 degrees C (59 and 85 degrees F). Protect from light. Throw away any unused medication after the expiration date. NOTE: This sheet is a summary. It may not cover all possible information. If you have questions about this medicine, talk to your doctor, pharmacist, or health care provider.  2024 Elsevier/Gold Standard (2021-02-27 00:00:00)

## 2024-04-13 NOTE — Progress Notes (Signed)
 Hematology and Oncology Follow Up Visit  Richard [name] [id number] 08-19-1940 83 y.o. 04/13/2024   Principle Diagnosis:  Diffuse large cell non-Hodgkin's lymphoma (IPI = 3) - NOT double hit Pernicious anemia Iron deficiency secondary to bleeding   Past Therapy: R-[facility] - s/p cycle 8 - completed 08/2017   Current Therapy:        Vitamin B12 1 mg IM every month Xgeva  120 mg subcu q 3 months - next dose due in 05/2024 IV iron as indicated   Interim History:  [name] is here today for follow-up and B 12 injection. He continues to do well and has no complaints at this time.  His golf game has been fantastic lately. He enjoys going with his buddies.  No issue with fever, chills, n/v, cough, rash, dizziness, SOB, chest pain, palpitations, abdominal pain or changes in bowel or bladder habits at this time.  He states neurology lowered is am dose of Lyrica  due to dizziness and this has helped.  He is taking align daily and metamucil prn to prevent constipation.  No blood loss noted. No petechiae.   No swelling in his extremities at this time.  Neuropathy in his lower extremities unchanged from baseline.  No falls or syncope reported.  Appetite and hydration are good.   ECOG Performance Status: 1 - Symptomatic but completely ambulatory  Medications:  Allergies as of 04/13/2024       Reactions   Ace Inhibitors Swelling, Other (See Comments)   Angioedema   Benazepril Swelling, Other (See Comments)   Angioedema; he is not a candidate for any angiotensin receptor blockers because of this significant allergic reaction. Because of a history of documented adverse serious drug reaction;Medi Alert bracelet  is recommended   Entresto  [sacubitril -valsartan ] Swelling, Other (See Comments)   First-in-Class Angiotensin Receptor Neprilysin Inhibitor- Med was red-flagged by the patient's pharmacy for him to NOT take!   Hctz [hydrochlorothiazide] Anaphylaxis, Swelling   Tongue and lip  swelling   Aspirin  Other (See Comments)   Gastritis, can aspirin  not take 325 mg aspirin         Medication List        Accurate as of April 13, 2024 10:25 AM. If you have any questions, ask your nurse or doctor.          acetaminophen  325 MG tablet Commonly known as: TYLENOL  Take 650 mg by mouth at bedtime as needed for moderate pain (pain score 4-6) or headache.   allopurinol  300 MG tablet Commonly known as: ZYLOPRIM  Take 450 mg by mouth daily.   aluminum hydroxide-magnesium  carbonate 95-358 MG/15ML Susp Commonly known as: GAVISCON Take 15 mLs by mouth as needed for indigestion or heartburn.   amoxicillin  500 MG capsule Commonly known as: AMOXIL  Take 500 mg by mouth 2 (two) times daily.   atorvastatin  80 MG tablet Commonly known as: LIPITOR  TAKE 1 TABLET BY MOUTH EVERYDAY AT BEDTIME   Azelastine  HCl 137 MCG/SPRAY Soln PLACE 2 SPRAYS INTO BOTH NOSTRILS AT BEDTIME AS NEEDED FOR RHINITIS OR ALLERGIES.   CO Q-10 PO Take 100 mg by mouth daily.   Eliquis  2.5 MG Tabs tablet Generic drug: apixaban  TAKE 1 TABLET BY MOUTH TWICE A DAY   esomeprazole  40 MG capsule Commonly known as: NexIUM  Take 1 capsule (40 mg total) by mouth daily.   ezetimibe  10 MG tablet Commonly known as: ZETIA  Take 1 tablet (10 mg total) by mouth daily. Pt needs office visit for further refills   Farxiga  10 MG Tabs tablet  Generic drug: dapagliflozin  propanediol Take 10 mg by mouth in the morning.   fenofibrate  160 MG tablet TAKE 1 TABLET BY MOUTH EVERY DAY   folic acid  1 MG tablet Commonly known as: FOLVITE  TAKE 2 TABLETS BY MOUTH EVERY DAY   freestyle lancets USE ONCE A DAY TO CHECK BLOOD SUGAR.   FREESTYLE LITE test strip Generic drug: glucose blood USE TO TEST BLOOD SUGAR ONCE A DAY. DX CODE: E11.9   gabapentin  100 MG capsule Commonly known as: NEURONTIN  Take 100 mg by mouth at bedtime. PRN   glimepiride  2 MG tablet Commonly known as: AMARYL  TAKE 1 TABLET (2 MG TOTAL) BY  MOUTH IN THE MORNING AND AT BEDTIME.   ICY HOT ADVANCED PAIN RELIEF EX Apply 1 application  topically daily as needed (to painful sites).   metoprolol  succinate 25 MG 24 hr tablet Commonly known as: TOPROL -XL TAKE 1 TABLET (25 MG TOTAL) BY MOUTH DAILY.   nitroGLYCERIN  0.4 MG SL tablet Commonly known as: NITROSTAT  PLACE 1 TABLET UNDER THE TONGUE EVERY 5 MINUTES AS NEEDED FOR CHEST PAIN.   potassium chloride  SA 20 MEQ tablet Commonly known as: KLOR-CON  M TAKE 2 TABLETS BY MOUTH EVERY DAY   pregabalin  150 MG capsule Commonly known as: LYRICA  1 po bid   psyllium 58.6 % powder Commonly known as: METAMUCIL Take 1 packet by mouth daily as needed (for constipation- mix and drink).   torsemide  20 MG tablet Commonly known as: DEMADEX  TAKE 2 TAB BY MOUTH IN MORNING TAKE EXTRA TABLET FOR WEIGHT GAIN MORE THAN 2-3LBS IN 24HR FOR 90DAY   Trulicity  4.5 MG/0.5ML Soaj Generic drug: Dulaglutide  INJECT 4.5 MG INTO THE SKIN ONCE A WEEK.   Vascepa  1 g capsule Generic drug: icosapent  Ethyl TAKE 2 CAPSULES BY MOUTH 2 TIMES DAILY. (DAW9 VASCEPA )   VITAMIN C  PO Take 1 tablet by mouth daily with breakfast.        Allergies:  Allergies  Allergen Reactions   Ace Inhibitors Swelling and Other (See Comments)    Angioedema    Benazepril Swelling and Other (See Comments)    Angioedema; he is not a candidate for any angiotensin receptor blockers because of this significant allergic reaction. Because of a history of documented adverse serious drug reaction;Medi Alert bracelet  is recommended   Entresto  [Sacubitril -Valsartan ] Swelling and Other (See Comments)    First-in-Class Angiotensin Receptor Neprilysin Inhibitor- Med was red-flagged by the patient's pharmacy for him to NOT take!   Hctz [Hydrochlorothiazide] Anaphylaxis and Swelling    Tongue and lip swelling    Aspirin  Other (See Comments)    Gastritis, can aspirin  not take 325 mg aspirin     Past Medical History, Surgical history,  Social history, and Family History were reviewed and updated.  Review of Systems: All other 10 point review of systems is negative.   Physical Exam:  vitals were not taken for this visit.   Wt Readings from Last 3 Encounters:  03/18/24 232 lb 3.2 oz (105.3 kg)  03/09/24 235 lb 1.3 oz (106.6 kg)  02/24/24 231 lb (104.8 kg)    Ocular: Sclerae unicteric, pupils equal, round and reactive to light Ear-nose-throat: Oropharynx clear, dentition fair Lymphatic: No cervical or supraclavicular adenopathy Lungs no rales or rhonchi, good excursion bilaterally Heart regular rate and rhythm, no murmur appreciated Abd soft, nontender, positive bowel sounds MSK no focal spinal tenderness, no joint edema Neuro: non-focal, well-oriented, appropriate affect Breasts: Deferred   Lab Results  Component Value Date   WBC 6.5 04/13/2024  HGB 14.1 04/13/2024   HCT 42.9 04/13/2024   MCV 90.7 04/13/2024   PLT 206 04/13/2024   Lab Results  Component Value Date   FERRITIN 1,312 (H) 03/09/2024   IRON 75 03/09/2024   TIBC 388 03/09/2024   UIBC 313 03/09/2024   IRONPCTSAT 19 03/09/2024   Lab Results  Component Value Date   RETICCTPCT 1.7 04/07/2023   RBC 4.73 04/13/2024   RETICCTABS 52.1 11/22/2011   No results found for: [facility]ON[facility] Lab Results  Component Value Date   IGA 159 03/09/2012   Lab Results  Component Value Date   ALBUMINELP 4.2 07/27/2018   MSPIKE Not Observed 07/27/2018     Chemistry      Component Value Date/Time   NA 142 03/18/2024 1152   NA 138 02/08/2022 0811   NA 144 06/27/2017 0857   K 4.9 03/18/2024 1152   K 3.9 06/27/2017 0857   CL 103 03/18/2024 1152   CL 103 06/27/2017 0857   CO2 31 03/18/2024 1152   CO2 26 06/27/2017 0857   BUN 34 (H) 03/18/2024 1152   BUN 46 (H) 02/08/2022 0811   BUN 12 06/27/2017 0857   CREATININE 1.71 (H) 03/18/2024 1152   CREATININE 1.97 (H) 03/09/2024 0821   CREATININE 2.01 (H) 12/19/2023 1443       Component Value Date/Time   CALCIUM  10.4 03/18/2024 1152   CALCIUM  9.2 06/27/2017 0857   ALKPHOS 69 03/18/2024 1152   ALKPHOS 128 (H) 06/27/2017 0857   AST 22 03/18/2024 1152   AST 29 03/09/2024 0821   ALT 19 03/18/2024 1152   ALT 21 03/09/2024 0821   ALT 24 06/27/2017 0857   BILITOT 0.7 03/18/2024 1152   BILITOT 0.4 03/09/2024 0821       Impression and Plan:  Mr. Pinn is a very pleasant 83 yo caucasian gentleman with diffuse large B-cell lymphoma (not double hit lymphoma). He completed treatment in February 2019.  We will proceed with B 12 injection.  Iron studies pending. We will replace if needed.  CBC and CMP routed to Dr. ALONSO Stank with nephrology.  Follow-up in 1 month.    Lauraine Pepper, NP 10/14/202510:25 AM

## 2024-04-21 ENCOUNTER — Encounter (HOSPITAL_BASED_OUTPATIENT_CLINIC_OR_DEPARTMENT_OTHER): Payer: Self-pay

## 2024-04-21 DIAGNOSIS — R001 Bradycardia, unspecified: Secondary | ICD-10-CM | POA: Diagnosis not present

## 2024-04-21 DIAGNOSIS — I483 Typical atrial flutter: Secondary | ICD-10-CM | POA: Diagnosis not present

## 2024-04-21 DIAGNOSIS — Z950 Presence of cardiac pacemaker: Secondary | ICD-10-CM | POA: Diagnosis not present

## 2024-04-21 DIAGNOSIS — I480 Paroxysmal atrial fibrillation: Secondary | ICD-10-CM | POA: Diagnosis not present

## 2024-04-21 DIAGNOSIS — T82897A Other specified complication of cardiac prosthetic devices, implants and grafts, initial encounter: Secondary | ICD-10-CM | POA: Diagnosis not present

## 2024-04-21 DIAGNOSIS — I429 Cardiomyopathy, unspecified: Secondary | ICD-10-CM | POA: Diagnosis not present

## 2024-04-21 DIAGNOSIS — I493 Ventricular premature depolarization: Secondary | ICD-10-CM | POA: Diagnosis not present

## 2024-04-21 DIAGNOSIS — I499 Cardiac arrhythmia, unspecified: Secondary | ICD-10-CM | POA: Diagnosis not present

## 2024-04-21 DIAGNOSIS — Z952 Presence of prosthetic heart valve: Secondary | ICD-10-CM | POA: Diagnosis not present

## 2024-04-22 ENCOUNTER — Ambulatory Visit (INDEPENDENT_AMBULATORY_CARE_PROVIDER_SITE_OTHER): Admitting: Internal Medicine

## 2024-04-22 ENCOUNTER — Encounter (HOSPITAL_BASED_OUTPATIENT_CLINIC_OR_DEPARTMENT_OTHER): Payer: Self-pay | Admitting: Internal Medicine

## 2024-04-22 VITALS — BP 108/72 | HR 68 | Ht 72.0 in | Wt 232.6 lb

## 2024-04-22 DIAGNOSIS — E781 Pure hyperglyceridemia: Secondary | ICD-10-CM | POA: Diagnosis not present

## 2024-04-22 DIAGNOSIS — I25810 Atherosclerosis of coronary artery bypass graft(s) without angina pectoris: Secondary | ICD-10-CM | POA: Diagnosis not present

## 2024-04-22 DIAGNOSIS — E785 Hyperlipidemia, unspecified: Secondary | ICD-10-CM | POA: Diagnosis not present

## 2024-04-22 DIAGNOSIS — I5022 Chronic systolic (congestive) heart failure: Secondary | ICD-10-CM | POA: Diagnosis not present

## 2024-04-22 DIAGNOSIS — Z952 Presence of prosthetic heart valve: Secondary | ICD-10-CM

## 2024-04-22 DIAGNOSIS — N1832 Chronic kidney disease, stage 3b: Secondary | ICD-10-CM

## 2024-04-22 MED ORDER — FENOFIBRATE 54 MG PO TABS: 54.0000 mg | ORAL_TABLET | Freq: Every day | ORAL | 2 refills | Status: AC

## 2024-04-22 NOTE — Progress Notes (Signed)
 LIPID CLINIC CONSULT NOTE  Chief Complaint:  High triglycerides  Primary Care Physician: Antonio Meth, Jamee SAUNDERS, DO  Primary Cardiologist:  Lynwood Schilling, MD  HPI:  Richard [name] is a 83 y.o. male who is being seen today for the evaluation of high triglycerides at the request of Antonio Meth Jamee SAUNDERS, *.  This is a pleasant 83 year old male followed by Dr. Schilling with a history of coronary artery disease and prior CABG in 2001 as well as TAVR.  He has done very well.  In fact he says he plays golf regularly and despite a low EF of 20 to 25% in October 24, he has felt well.  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  He has had some recent issues with pacemakers as well as infection but ultimately this has resolved.  He says he is now on chronic antibiotic suppression.  Is referred for evaluation of elevated triglycerides.  I suspect this is genetic as he has had high triglycerides for most of his life.  He may be contributing to his coronary artery disease.  He has been well treated, in fact is on atorvastatin  80 mg daily, ezetimibe  10 mg daily, fenofibrate  160 mg daily and Vascepa  2 g twice daily.  This compromises the most effective treatments that are commercially available.  There are of course treatments that are on the horizon however it is unlikely he has a genetic familial chylomicronemia syndrome and therefore would not be a candidate at this time for [facility].  Expanding approval however for this medicine is expected in the next few years.  Beyond that of note he does have some chronic kidney disease.  His last creatinine was 1.8 with a GFR of 36.  Based on that his fenofibrate  dose is too high and needs to be adjusted downwards.  This can worsen renal function.  Recent lipid profile in September showed total cholesterol 182, triglycerides 726, HDL 32 and direct LDL 52.  Hemoglobin A1c has been elevated at 7.8%.  PMHx:  Past  Medical History:  Diagnosis Date   Actinic keratosis 03/29/2024   R upper arm, EDC   Anemia    Arthritis    hips   Axillary adenopathy 02/25/2017   Bradycardia    a. holter monitor has demonstrated HRs in 30s and Weink[place]ach    CAD (coronary artery disease)    a. s/p CABG 2001  b.  07/28/2017 cath:   Severe three-vessel native CAD with total occlusion of LAD, ramus intermedius, first OM and RCA, patent RIMA to PDA, LIMA to LAD, sequential SVG to ramus intermedius and first OM.     Chronic lower back pain    Diffuse large B cell lymphoma (HCC)    Diverticulosis    Esophageal stricture    GERD (gastroesophageal reflux disease)    History of gout    HTN (hypertension)    Mixed hyperlipidemia    OSA on CPAP    with 2L O2 at night   Pancytopenia (HCC)    a. related to chemo therapy for B cell lymphoma   Peptic stricture of esophagus    Presence of permanent cardiac pacemaker    sees Dr. Maya pacemaker   SCC (squamous cell carcinoma) 01/31/2021   R zygoma, EDC   SCC (squamous cell carcinoma) 01/31/2021   L post ankle, EDC   SCC (squamous cell carcinoma) 01/31/2021   L popliteal, EDC  Severe aortic stenosis    Spinal stenosis    Squamous cell carcinoma in situ 03/29/2024   R ant thigh, EDC   Squamous cell carcinoma of skin 03/22/2009   Right mandible. SCCis, hypertrophic.    Squamous cell carcinoma of skin 12/25/2021   R lateral cheek, EDC   Squamous cell carcinoma of skin 12/25/2021   R postauricular neck, EDC   Squamous cell carcinoma of skin 12/25/2021   R forearm sup, EDC   Squamous cell carcinoma of skin 12/25/2021   R wrist, EDC   Squamous cell carcinoma of skin 08/27/2023   Left thenar hand dorsum, EDC   Squamous cell carcinoma of skin 12/23/2023   Right Mid Forearm Thenar, EDC   Squamous cell carcinoma of skin 12/23/2023   Right Thenar Hand - Dorsum, EDC   Squamous cell carcinoma of skin 12/23/2023   Right Upper pretibial, EDC   Squamous cell  carcinoma of skin 03/29/2024   L preauricular, EDC   Type II diabetes mellitus (HCC)    Wears dentures    partial upper    Past Surgical History:  Procedure Laterality Date   APPENDECTOMY  ~ 1952   BACK SURGERY     CATARACT EXTRACTION W/PHACO Left 11/06/2016   Procedure: CATARACT EXTRACTION PHACO AND INTRAOCULAR LENS PLACEMENT (IOC);  Surgeon: Dingeldein, Steven, MD;  Location: [facility] ORS;  Service: Ophthalmology;  Laterality: Left;  Lot # [id number] H US : 01:09.4 AP%:25.2 CDE: 30.64   CATARACT EXTRACTION W/PHACO Right 12/04/2016   Procedure: CATARACT EXTRACTION PHACO AND INTRAOCULAR LENS PLACEMENT (IOC);  Surgeon: Dingeldein, Steven, MD;  Location: [facility] ORS;  Service: Ophthalmology;  Laterality: Right;  US  1:25.9 AP% 24.1 CDE 39.10 Fluid Pack lot # [id number]   COLONOSCOPY W/ BIOPSIES AND POLYPECTOMY  07/02/2011   CORONARY ANGIOPLASTY  07/02/1991   CORONARY ANGIOPLASTY WITH STENT PLACEMENT  05/31/1997   1   CORONARY ARTERY BYPASS GRAFT  03/01/2000   CABG X5   ECTROPION REPAIR Right 09/01/2018   Procedure: REPAIR OF ECTROPION BILATERAL upper and lower;  Surgeon: Ashley Greig HERO, MD;  Location: [facility];  Service: Ophthalmology;  Laterality: Right;  Diabetic - oral meds sleep apnea   ESOPHAGEAL DILATION  X 3-4   Dr. Sheppard Finn; last one was in the 1990's   ESOPHAGOGASTRODUODENOSCOPY     multiple   FLEXIBLE SIGMOIDOSCOPY     multiple   HYDRADENITIS EXCISION Left 02/25/2017   Procedure: EXCISION DEEP LEFT AXILLARY LYMPH NODE;  Surgeon: Gail Favorite, MD;  Location: [facility] OR;  Service: General;  Laterality: Left;   INTRAOPERATIVE TRANSTHORACIC ECHOCARDIOGRAM N/A 09/09/2017   Procedure: INTRAOPERATIVE TRANSTHORACIC ECHOCARDIOGRAM;  Surgeon: Verlin Lonni BIRCH, MD;  Location: [facility] OR;  Service: Open Heart Surgery;  Laterality: N/A;   KNEE ARTHROSCOPY Left 07/01/2009   meniscus repair   LEFT HEART CATHETERIZATION WITH CORONARY/GRAFT ANGIOGRAM N/A 03/16/2014   Procedure:  LEFT HEART CATHETERIZATION WITH EL BILE;  Surgeon: Lonni BIRCH Verlin, MD;  Location: [facility] CATH LAB;  Service: Cardiovascular;  Laterality: N/A;   LUMBAR LAMINECTOMY/DECOMPRESSION MICRODISCECTOMY Right 06/17/2013   Procedure: LUMBAR LAMINECTOMY MICRODISCECTOMY L4-L5 RIGHT EXCISION OF SYNOVIAL CYST RIGHT   (1 LEVEL) RIGHT PARTIAL FACETECTOMY;  Surgeon: Tanda DELENA Heading, MD;  Location: [facility] ORS;  Service: Orthopedics;  Laterality: Right;   MYELOGRAM  04/06/2013   lumbar, Dr Heading   ORBITAL LESION EXCISION Right 09/01/2018   Procedure: ORBITOTOMY WITHOUT BONE FLAP WITH REMOVAL OF LESION RIGHT;  Surgeon: Ashley Greig HERO, MD;  Location: [facility];  Service:  Ophthalmology;  Laterality: Right;   PACEMAKER IMPLANT N/A 09/10/2017   Procedure: PACEMAKER IMPLANT;  Surgeon: Fernande Elspeth BROCKS, MD;  Location: [facility] INVASIVE CV LAB;  Service: Cardiovascular;  Laterality: N/A;   PANENDOSCOPY     PERIPHERAL VASCULAR BALLOON ANGIOPLASTY Left 03/16/2021   Procedure: PERIPHERAL VASCULAR BALLOON ANGIOPLASTY;  Surgeon: Magda Debby SAILOR, MD;  Location: [facility] INVASIVE CV LAB;  Service: Cardiovascular;  Laterality: Left;  subclavian  vein   PORTACATH PLACEMENT N/A 03/06/2017   Procedure: INSERTION PORT-A-CATH AND ASPIRATE SEROMA LEFT AXILLA;  Surgeon: Gail Favorite, MD;  Location: [facility] OR;  Service: General;  Laterality: N/A;   RIGHT/LEFT HEART CATH AND CORONARY/GRAFT ANGIOGRAPHY N/A 07/28/2017   Procedure: RIGHT/LEFT HEART CATH AND CORONARY/GRAFT ANGIOGRAPHY;  Surgeon: Wonda Sharper, MD;  Location: [facility] INVASIVE CV LAB;  Service: Cardiovascular;  Laterality: N/A;   SHOULDER SURGERY Right 08/30/2010   screws placed; tendons tore off   SKIN CANCER EXCISION Right    neck   TONSILLECTOMY  ~ 1954   TOTAL KNEE ARTHROPLASTY Left 06/29/2019   Procedure: TOTAL KNEE ARTHROPLASTY;  Surgeon: Ernie Cough, MD;  Location: [facility] ORS;  Service: Orthopedics;  Laterality: Left;  70 mins   TRANSCATHETER AORTIC  VALVE REPLACEMENT, TRANSFEMORAL N/A 09/09/2017   Procedure: TRANSCATHETER AORTIC VALVE REPLACEMENT, TRANSFEMORAL;  Surgeon: Verlin Lonni BIRCH, MD;  Location: [facility] OR;  Service: Open Heart Surgery;  Laterality: N/A;   UPPER EXTREMITY VENOGRAPHY Left 02/15/2021   Procedure: CENTRAL VENO;  Surgeon: Magda Debby SAILOR, MD;  Location: [facility] INVASIVE CV LAB;  Service: Cardiovascular;  Laterality: Left;   UPPER EXTREMITY VENOGRAPHY N/A 03/16/2021   Procedure: UPPER EXTREMITY VENOGRAPHY;  Surgeon: Magda Debby SAILOR, MD;  Location: [facility] INVASIVE CV LAB;  Service: Cardiovascular;  Laterality: N/A;   UPPER GI ENDOSCOPY  07/02/2011   Gastritis; Dr Avram   VASECTOMY     wireless pacemaker placed      FAMHx:  Family History  Problem Relation Age of Onset   Stroke Father    Hypertension Father    Pancreatic cancer Mother    Diabetes Maternal Grandmother    Stroke Maternal Grandmother    Heart attack Paternal Grandmother    Colon cancer Neg Hx    Esophageal cancer Neg Hx    Rectal cancer Neg Hx    Stomach cancer Neg Hx    Ulcers Neg Hx     SOCHx:   reports that he has never smoked. He has never been exposed to tobacco smoke. He has never used smokeless tobacco. He reports that he does not drink alcohol and does not use drugs.  ALLERGIES:  Allergies  Allergen Reactions   Ace Inhibitors Swelling and Other (See Comments)    Angioedema    Benazepril Swelling and Other (See Comments)    Angioedema; he is not a candidate for any angiotensin receptor blockers because of this significant allergic reaction. Because of a history of documented adverse serious drug reaction;Medi Alert bracelet  is recommended   Entresto  [Sacubitril -Valsartan ] Swelling and Other (See Comments)    First-in-Class Angiotensin Receptor Neprilysin Inhibitor- Med was red-flagged by the patient's pharmacy for him to NOT take!   Hctz [Hydrochlorothiazide] Anaphylaxis and Swelling    Tongue and lip swelling    Aspirin  Other  (See Comments)    Gastritis, can aspirin  not take 325 mg aspirin     ROS: Pertinent items noted in HPI and remainder of comprehensive ROS otherwise negative.  HOME MEDS: Current Outpatient Medications on File Prior to Visit  Medication Sig  Dispense Refill   acetaminophen  (TYLENOL ) 325 MG tablet Take 650 mg by mouth at bedtime as needed for moderate pain (pain score 4-6) or headache.     allopurinol  (ZYLOPRIM ) 300 MG tablet Take 450 mg by mouth daily.     aluminum hydroxide-magnesium  carbonate (GAVISCON) 95-358 MG/15ML SUSP Take 15 mLs by mouth as needed for indigestion or heartburn.     amoxicillin  (AMOXIL ) 500 MG capsule Take 500 mg by mouth 2 (two) times daily.     Ascorbic Acid  (VITAMIN C  PO) Take 1 tablet by mouth daily with breakfast.     atorvastatin  (LIPITOR ) 80 MG tablet TAKE 1 TABLET BY MOUTH EVERYDAY AT BEDTIME 90 tablet 3   Azelastine  HCl 137 MCG/SPRAY SOLN PLACE 2 SPRAYS INTO BOTH NOSTRILS AT BEDTIME AS NEEDED FOR RHINITIS OR ALLERGIES. 90 mL 1   Coenzyme Q10 (CO Q-10 PO) Take 100 mg by mouth daily.     Dulaglutide  (TRULICITY ) 4.5 MG/0.5ML SOAJ INJECT 4.5 MG INTO THE SKIN ONCE A WEEK. 6 mL 1   ELIQUIS  2.5 MG TABS tablet TAKE 1 TABLET BY MOUTH TWICE A DAY 180 tablet 1   esomeprazole  (NEXIUM ) 40 MG capsule Take 1 capsule (40 mg total) by mouth daily. 30 capsule 5   ezetimibe  (ZETIA ) 10 MG tablet Take 1 tablet (10 mg total) by mouth daily. Pt needs office visit for further refills 90 tablet 1   FARXIGA  10 MG TABS tablet Take 10 mg by mouth in the morning.     fenofibrate  160 MG tablet TAKE 1 TABLET BY MOUTH EVERY DAY 90 tablet 1   folic acid  (FOLVITE ) 1 MG tablet TAKE 2 TABLETS BY MOUTH EVERY DAY 180 tablet 4   FREESTYLE LITE test strip USE TO TEST BLOOD SUGAR ONCE A DAY. DX CODE: E11.9 100 strip 12   gabapentin  (NEURONTIN ) 100 MG capsule Take 100 mg by mouth at bedtime. PRN     glimepiride  (AMARYL ) 2 MG tablet TAKE 1 TABLET (2 MG TOTAL) BY MOUTH IN THE MORNING AND AT BEDTIME.  (Patient taking differently: Take 4 mg by mouth daily with breakfast.) 180 tablet 1   Lancets (FREESTYLE) lancets USE ONCE A DAY TO CHECK BLOOD SUGAR. 100 each 12   Menthol -Camphor (ICY HOT ADVANCED PAIN RELIEF EX) Apply 1 application  topically daily as needed (to painful sites).     metoprolol  succinate (TOPROL -XL) 25 MG 24 hr tablet TAKE 1 TABLET (25 MG TOTAL) BY MOUTH DAILY. 90 tablet 3   nitroGLYCERIN  (NITROSTAT ) 0.4 MG SL tablet PLACE 1 TABLET UNDER THE TONGUE EVERY 5 MINUTES AS NEEDED FOR CHEST PAIN. 25 tablet 6   potassium chloride  SA (KLOR-CON  M) 20 MEQ tablet TAKE 2 TABLETS BY MOUTH EVERY DAY 180 tablet 2   pregabalin  (LYRICA ) 150 MG capsule 1 po bid 180 capsule 1   psyllium (METAMUCIL) 58.6 % powder Take 1 packet by mouth daily as needed (for constipation- mix and drink).     torsemide  (DEMADEX ) 20 MG tablet TAKE 2 TAB BY MOUTH IN MORNING TAKE EXTRA TABLET FOR WEIGHT GAIN MORE THAN 2-3LBS IN 24HR FOR 90DAY 205 tablet 0   VASCEPA  1 g capsule TAKE 2 CAPSULES BY MOUTH 2 TIMES DAILY. (DAW9 VASCEPA ) 360 capsule 1   Calcium  Polycarbophil (FIBER) 625 MG TABS Take 625 mg by mouth daily.     No current facility-administered medications on file prior to visit.    LABS/IMAGING: No results found. However, due to the size of the patient record, not all encounters were searched.  Please check Results Review for a complete set of results. No results found.  LIPID PANEL:    Component Value Date/Time   CHOL 182 03/18/2024 1152   CHOL 162 11/23/2020 0828   TRIG (H) 03/18/2024 1152    726.0 Triglyceride is over 400; calculations on Lipids are invalid.   HDL 32.00 (L) 03/18/2024 1152   HDL 38 (L) 11/23/2020 0828   CHOLHDL 6 03/18/2024 1152   VLDL 145.2 (H) 03/18/2024 1152   LDLCALC  12/19/2023 1443     Comment:     . LDL cholesterol not calculated. Triglyceride levels greater than 400 mg/dL invalidate calculated LDL results. . Reference range: <100 . Desirable range <100 mg/dL for primary  prevention;   <70 mg/dL for patients with CHD or diabetic patients  with > or = 2 CHD risk factors. SABRA LDL-C is now calculated using the Martin-Hopkins  calculation, which is a validated novel method providing  better accuracy than the Friedewald equation in the  estimation of LDL-C.  Gladis APPLETHWAITE et al. SANDREA. 7986;689(80): 2061-2068  (http://education.[employer].com/faq/FAQ164)    LDLDIRECT 52.0 03/18/2024 1152    No results found for: LIPOA   WEIGHTS: Wt Readings from Last 3 Encounters:  04/22/24 232 lb 9.6 oz (105.5 kg)  04/13/24 234 lb 1.9 oz (106.2 kg)  03/18/24 232 lb 3.2 oz (105.3 kg)    VITALS: BP 108/72 (BP Location: Right Arm, Patient Position: Sitting, Cuff Size: Normal)   Pulse 68   Ht 6' (1.829 m)   Wt 232 lb 9.6 oz (105.5 kg)   SpO2 94%   BMI 31.55 kg/m   EXAM: Deferred  EKG: Deferred  ASSESSMENT: Severe hypertriglyceridemia Dyslipidemia, goal LDL less than 55 Coronary artery disease status post CABG Prior TAVR Chronic systolic heart failure, LVEF 20 to 25% Status post CRT-P  PLAN: 1.   Mr. Witucki is already on excellent therapy first triglycerides however they remain elevated.  I provided dietary information and I think better glycemic control and dietary interventions can improve his triglycerides further.  Particularly focusing on lowering saturated fats.  I would recommend remaining on his current medications with the exception of lowering the dose of his fenofibrate  from 160 mg daily to 54 mg daily due to his decreased GFR.  Fenofibrate  may actually worsen the GFR.  He might be a candidate for Demetria in a few years once he gets expanded indications.  I am happy to see him back as needed for that.  Thanks again for the kind referral.  Vinie KYM Maxcy, MD, [facility], FNLA, FACP  [place]  [facility]  Medical Director of the [facility] Lipid [facility]  Cardiovascular Risk Reduction [facility] Diplomate of the American Board of Clinical  Lipidology Attending Cardiologist  Direct Dial: [phone]  Fax: [phone]  Website:  [email or web address] Vinie BROCKS Myan Locatelli 04/22/2024, 8:04 AM

## 2024-04-22 NOTE — Patient Instructions (Signed)
 Medication Instructions:   DECREASE YOUR FENOFIBRATE  TO 54 MG BY MOUTH DAILY  *If you need a refill on your cardiac medications before your next appointment, please call your pharmacy*   Follow-Up:  AS NEEDED WITH DR. HILTY

## 2024-04-30 DIAGNOSIS — M545 Low back pain, unspecified: Secondary | ICD-10-CM | POA: Diagnosis not present

## 2024-04-30 DIAGNOSIS — M791 Myalgia, unspecified site: Secondary | ICD-10-CM | POA: Diagnosis not present

## 2024-05-11 ENCOUNTER — Other Ambulatory Visit: Payer: Self-pay

## 2024-05-11 ENCOUNTER — Encounter: Payer: Self-pay | Admitting: Family

## 2024-05-11 ENCOUNTER — Inpatient Hospital Stay (HOSPITAL_BASED_OUTPATIENT_CLINIC_OR_DEPARTMENT_OTHER): Admitting: Family

## 2024-05-11 ENCOUNTER — Inpatient Hospital Stay

## 2024-05-11 ENCOUNTER — Inpatient Hospital Stay: Attending: Hematology & Oncology

## 2024-05-11 VITALS — BP 133/76 | HR 81 | Temp 97.5°F | Resp 20 | Ht 70.0 in | Wt 230.0 lb

## 2024-05-11 DIAGNOSIS — D519 Vitamin B12 deficiency anemia, unspecified: Secondary | ICD-10-CM

## 2024-05-11 DIAGNOSIS — E611 Iron deficiency: Secondary | ICD-10-CM | POA: Insufficient documentation

## 2024-05-11 DIAGNOSIS — D500 Iron deficiency anemia secondary to blood loss (chronic): Secondary | ICD-10-CM

## 2024-05-11 DIAGNOSIS — K5900 Constipation, unspecified: Secondary | ICD-10-CM | POA: Diagnosis not present

## 2024-05-11 DIAGNOSIS — Z23 Encounter for immunization: Secondary | ICD-10-CM | POA: Diagnosis not present

## 2024-05-11 DIAGNOSIS — C8332 Diffuse large B-cell lymphoma, intrathoracic lymph nodes: Secondary | ICD-10-CM | POA: Insufficient documentation

## 2024-05-11 DIAGNOSIS — D511 Vitamin B12 deficiency anemia due to selective vitamin B12 malabsorption with proteinuria: Secondary | ICD-10-CM | POA: Diagnosis not present

## 2024-05-11 DIAGNOSIS — D509 Iron deficiency anemia, unspecified: Secondary | ICD-10-CM

## 2024-05-11 DIAGNOSIS — D508 Other iron deficiency anemias: Secondary | ICD-10-CM

## 2024-05-11 DIAGNOSIS — Z79899 Other long term (current) drug therapy: Secondary | ICD-10-CM | POA: Diagnosis not present

## 2024-05-11 DIAGNOSIS — D510 Vitamin B12 deficiency anemia due to intrinsic factor deficiency: Secondary | ICD-10-CM | POA: Insufficient documentation

## 2024-05-11 DIAGNOSIS — G629 Polyneuropathy, unspecified: Secondary | ICD-10-CM | POA: Insufficient documentation

## 2024-05-11 DIAGNOSIS — Z950 Presence of cardiac pacemaker: Secondary | ICD-10-CM | POA: Diagnosis not present

## 2024-05-11 DIAGNOSIS — D518 Other vitamin B12 deficiency anemias: Secondary | ICD-10-CM

## 2024-05-11 LAB — CMP (CANCER CENTER ONLY)
ALT: 38 U/L (ref 0–44)
AST: 33 U/L (ref 15–41)
Albumin: 4.8 g/dL (ref 3.5–5.0)
Alkaline Phosphatase: 93 U/L (ref 38–126)
Anion gap: 13 (ref 5–15)
BUN: 50 mg/dL — ABNORMAL HIGH (ref 8–23)
CO2: 28 mmol/L (ref 22–32)
Calcium: 10 mg/dL (ref 8.9–10.3)
Chloride: 102 mmol/L (ref 98–111)
Creatinine: 1.67 mg/dL — ABNORMAL HIGH (ref 0.61–1.24)
GFR, Estimated: 40 mL/min — ABNORMAL LOW (ref >60)
Glucose, Bld: 175 mg/dL — ABNORMAL HIGH (ref 70–99)
Potassium: 4.7 mmol/L (ref 3.5–5.1)
Sodium: 143 mmol/L (ref 135–145)
Total Bilirubin: 0.6 mg/dL (ref 0.0–1.2)
Total Protein: 7.6 g/dL (ref 6.5–8.1)

## 2024-05-11 LAB — CBC WITH DIFFERENTIAL (CANCER CENTER ONLY)
Abs Immature Granulocytes: 0.04 K/uL (ref 0.00–0.07)
Basophils Absolute: 0 K/uL (ref 0.0–0.1)
Basophils Relative: 0 %
Eosinophils Absolute: 0.2 K/uL (ref 0.0–0.5)
Eosinophils Relative: 2 %
HCT: 47.1 % (ref 39.0–52.0)
Hemoglobin: 15.4 g/dL (ref 13.0–17.0)
Immature Granulocytes: 0 %
Lymphocytes Relative: 21 %
Lymphs Abs: 2 K/uL (ref 0.7–4.0)
MCH: 29.9 pg (ref 26.0–34.0)
MCHC: 32.7 g/dL (ref 30.0–36.0)
MCV: 91.5 fL (ref 80.0–100.0)
Monocytes Absolute: 0.6 K/uL (ref 0.1–1.0)
Monocytes Relative: 6 %
Neutro Abs: 6.7 K/uL (ref 1.7–7.7)
Neutrophils Relative %: 71 %
Platelet Count: 185 K/uL (ref 150–400)
RBC: 5.15 MIL/uL (ref 4.22–5.81)
RDW: 15.9 % — ABNORMAL HIGH (ref 11.5–15.5)
WBC Count: 9.5 K/uL (ref 4.0–10.5)
nRBC: 0 % (ref 0.0–0.2)

## 2024-05-11 LAB — IRON AND IRON BINDING CAPACITY (CC-WL,HP ONLY)
Iron: 98 ug/dL (ref 45–182)
Saturation Ratios: 24 % (ref 17.9–39.5)
TIBC: 403 ug/dL (ref 250–450)
UIBC: 305 ug/dL

## 2024-05-11 LAB — FERRITIN: Ferritin: 1410 ng/mL — ABNORMAL HIGH (ref 24–336)

## 2024-05-11 LAB — VITAMIN B12: Vitamin B-12: 255 pg/mL (ref 180–914)

## 2024-05-11 MED ORDER — CYANOCOBALAMIN 1000 MCG/ML IJ SOLN
1000.0000 ug | Freq: Once | INTRAMUSCULAR | Status: AC
  Administered 2024-05-11: 1000 ug via INTRAMUSCULAR
  Filled 2024-05-11: qty 1

## 2024-05-11 MED ORDER — DENOSUMAB 120 MG/1.7ML ~~LOC~~ SOLN
120.0000 mg | Freq: Once | SUBCUTANEOUS | Status: AC
  Administered 2024-05-11: 120 mg via SUBCUTANEOUS
  Filled 2024-05-11: qty 1.7

## 2024-05-11 NOTE — Patient Instructions (Signed)
 Vitamin B12 Injection What is this medication? Vitamin B12 (VAHY tuh min B12) prevents and treats low vitamin B12 levels in your body. It is used in people who do not get enough vitamin B12 from their diet or when their digestive tract does not absorb enough. Vitamin B12 plays an important role in maintaining the health of your nervous system and red blood cells. This medicine may be used for other purposes; ask your health care provider or pharmacist if you have questions. COMMON BRAND NAME(S): B-12 Compliance Kit, B-12 Injection Kit, Cyomin, Dodex, LA-12, Nutri-Twelve, Physicians EZ Use B-12, Primabalt, Vitamin Deficiency Injectable System - B12 What should I tell my care team before I take this medication? They need to know if you have any of these conditions: Kidney disease Leber's disease Megaloblastic anemia An unusual or allergic reaction to cyanocobalamin, cobalt, other medications, foods, dyes, or preservatives Pregnant or trying to get pregnant Breast-feeding How should I use this medication? This medication is injected into a muscle or deeply under the skin. It is usually given in a clinic or care team's office. However, your care team may teach you how to inject yourself. Follow all instructions. Talk to your care team about the use of this medication in children. Special care may be needed. Overdosage: If you think you have taken too much of this medicine contact a poison control center or emergency room at once. NOTE: This medicine is only for you. Do not share this medicine with others. What if I miss a dose? If you are given your dose at a clinic or care team's office, call to reschedule your appointment. If you give your own injections, and you miss a dose, take it as soon as you can. If it is almost time for your next dose, take only that dose. Do not take double or extra doses. What may interact with this medication? Alcohol Colchicine This list may not describe all possible  interactions. Give your health care provider a list of all the medicines, herbs, non-prescription drugs, or dietary supplements you use. Also tell them if you smoke, drink alcohol, or use illegal drugs. Some items may interact with your medicine. What should I watch for while using this medication? Visit your care team regularly. You may need blood work done while you are taking this medication. You may need to follow a special diet. Talk to your care team. Limit your alcohol intake and avoid smoking to get the best benefit. What side effects may I notice from receiving this medication? Side effects that you should report to your care team as soon as possible: Allergic reactions--skin rash, itching, hives, swelling of the face, lips, tongue, or throat Swelling of the ankles, hands, or feet Trouble breathing Side effects that usually do not require medical attention (report to your care team if they continue or are bothersome): Diarrhea This list may not describe all possible side effects. Call your doctor for medical advice about side effects. You may report side effects to FDA at 1-800-FDA-1088. Where should I keep my medication? Keep out of the reach of children. Store at room temperature between 15 and 30 degrees C (59 and 85 degrees F). Protect from light. Throw away any unused medication after the expiration date. NOTE: This sheet is a summary. It may not cover all possible information. If you have questions about this medicine, talk to your doctor, pharmacist, or health care provider.  2024 Elsevier/Gold Standard (2021-02-27 00:00:00) Denosumab Injection (Oncology) What is this medication? DENOSUMAB (  den oh SUE mab) prevents weakened bones caused by cancer. It may also be used to treat noncancerous bone tumors that cannot be removed by surgery. It can also be used to treat high calcium levels in the blood caused by cancer. It works by blocking a protein that causes bones to break down  quickly. This slows down the release of calcium from bones, which lowers calcium levels in your blood. It also makes your bones stronger and less likely to break (fracture). This medicine may be used for other purposes; ask your health care provider or pharmacist if you have questions. COMMON BRAND NAME(S): XGEVA What should I tell my care team before I take this medication? They need to know if you have any of these conditions: Dental disease Having surgery or tooth extraction Infection Kidney disease Low levels of calcium or vitamin D in the blood Malnutrition On hemodialysis Skin conditions or sensitivity Thyroid or parathyroid disease An unusual reaction to denosumab, other medications, foods, dyes, or preservatives Pregnant or trying to get pregnant Breast-feeding How should I use this medication? This medication is for injection under the skin. It is given by your care team in a hospital or clinic setting. A special MedGuide will be given to you before each treatment. Be sure to read this information carefully each time. Talk to your care team about the use of this medication in children. While it may be prescribed for children as young as 13 years for selected conditions, precautions do apply. Overdosage: If you think you have taken too much of this medicine contact a poison control center or emergency room at once. NOTE: This medicine is only for you. Do not share this medicine with others. What if I miss a dose? Keep appointments for follow-up doses. It is important not to miss your dose. Call your care team if you are unable to keep an appointment. What may interact with this medication? Do not take this medication with any of the following: Other medications containing denosumab This medication may also interact with the following: Medications that lower your chance of fighting infection Steroid medications, such as prednisone or cortisone This list may not describe all  possible interactions. Give your health care provider a list of all the medicines, herbs, non-prescription drugs, or dietary supplements you use. Also tell them if you smoke, drink alcohol, or use illegal drugs. Some items may interact with your medicine. What should I watch for while using this medication? Your condition will be monitored carefully while you are receiving this medication. You may need blood work while taking this medication. This medication may increase your risk of getting an infection. Call your care team for advice if you get a fever, chills, sore throat, or other symptoms of a cold or flu. Do not treat yourself. Try to avoid being around people who are sick. You should make sure you get enough calcium and vitamin D while you are taking this medication, unless your care team tells you not to. Discuss the foods you eat and the vitamins you take with your care team. Some people who take this medication have severe bone, joint, or muscle pain. This medication may also increase your risk for jaw problems or a broken thigh bone. Tell your care team right away if you have severe pain in your jaw, bones, joints, or muscles. Tell your care team if you have any pain that does not go away or that gets worse. Talk to your care team if you may  be pregnant. Serious birth defects can occur if you take this medication during pregnancy and for 5 months after the last dose. You will need a negative pregnancy test before starting this medication. Contraception is recommended while taking this medication and for 5 months after the last dose. Your care team can help you find the option that works for you. What side effects may I notice from receiving this medication? Side effects that you should report to your care team as soon as possible: Allergic reactions--skin rash, itching, hives, swelling of the face, lips, tongue, or throat Bone, joint, or muscle pain Low calcium level--muscle pain or cramps,  confusion, tingling, or numbness in the hands or feet Osteonecrosis of the jaw--pain, swelling, or redness in the mouth, numbness of the jaw, poor healing after dental work, unusual discharge from the mouth, visible bones in the mouth Side effects that usually do not require medical attention (report to your care team if they continue or are bothersome): Cough Diarrhea Fatigue Headache Nausea This list may not describe all possible side effects. Call your doctor for medical advice about side effects. You may report side effects to FDA at 1-800-FDA-1088. Where should I keep my medication? This medication is given in a hospital or clinic. It will not be stored at home. NOTE: This sheet is a summary. It may not cover all possible information. If you have questions about this medicine, talk to your doctor, pharmacist, or health care provider.  2024 Elsevier/Gold Standard (2021-11-07 00:00:00)

## 2024-05-11 NOTE — Progress Notes (Addendum)
 Hematology and Oncology Follow Up Visit  Richard [name] [id number] March 27, 1941 83 y.o. 05/11/2024   Principle Diagnosis:  Diffuse large cell non-Hodgkin's lymphoma (IPI = 3) - NOT double hit Pernicious anemia Iron deficiency secondary to bleeding   Past Therapy: R-CHOP - s/p cycle 8 - completed 08/2017   Current Therapy:        Vitamin B12 1 mg IM every month Xgeva  120 mg subcu q 3 months - next dose due in 08/2024 IV iron as indicated   Interim History:  Richard [name] is here today for follow-up, B 12 and Xgeva . He is doing well and only complaint is constipation.  He states that he is taking Metamucil once a day along with Align. He will try replacing the metamucil with Miralax  BID until he has BM and then take as needed.  No blood loss noted. No bruising or petechiae.  No fever, chills, n/v, cough, rash, dizziness, SOB, chest pain, palpitations, abdominal pain or changes in bowel or bladder habits.  No swelling in his extremities.  Neuropathy in the lower extremities unchanged from baseline.  No falls or syncope reported.  He has not let the cold weather stop his awesome golf game! Appetite and hydration are good. Weight is stable at 230 lbs.   ECOG Performance Status: 1 - Symptomatic but completely ambulatory  Medications:  Allergies as of 05/11/2024       Reactions   Ace Inhibitors Swelling, Other (See Comments)   Angioedema   Benazepril Swelling, Other (See Comments)   Angioedema; he is not a candidate for any angiotensin receptor blockers because of this significant allergic reaction. Because of a history of documented adverse serious drug reaction;Medi Alert bracelet  is recommended   Entresto  [sacubitril -valsartan ] Swelling, Other (See Comments)   First-in-Class Angiotensin Receptor Neprilysin Inhibitor- Med was red-flagged by the patient's pharmacy for him to NOT take!   Hctz [hydrochlorothiazide] Anaphylaxis, Swelling   Tongue and lip swelling   Aspirin  Other  (See Comments)   Gastritis, can aspirin  not take 325 mg aspirin         Medication List        Accurate as of May 11, 2024 11:01 AM. If you have any questions, ask your nurse or doctor.          acetaminophen  325 MG tablet Commonly known as: TYLENOL  Take 650 mg by mouth at bedtime as needed for moderate pain (pain score 4-6) or headache.   allopurinol  300 MG tablet Commonly known as: ZYLOPRIM  Take 450 mg by mouth daily.   aluminum hydroxide-magnesium  carbonate 95-358 MG/15ML Susp Commonly known as: GAVISCON Take 15 mLs by mouth as needed for indigestion or heartburn.   amoxicillin  500 MG capsule Commonly known as: AMOXIL  Take 500 mg by mouth 2 (two) times daily.   atorvastatin  80 MG tablet Commonly known as: LIPITOR  TAKE 1 TABLET BY MOUTH EVERYDAY AT BEDTIME   Azelastine  HCl 137 MCG/SPRAY Soln PLACE 2 SPRAYS INTO BOTH NOSTRILS AT BEDTIME AS NEEDED FOR RHINITIS OR ALLERGIES.   CO Q-10 PO Take 100 mg by mouth daily.   Eliquis  2.5 MG Tabs tablet Generic drug: apixaban  TAKE 1 TABLET BY MOUTH TWICE A DAY   esomeprazole  40 MG capsule Commonly known as: NexIUM  Take 1 capsule (40 mg total) by mouth daily.   ezetimibe  10 MG tablet Commonly known as: ZETIA  Take 1 tablet (10 mg total) by mouth daily. Pt needs office visit for further refills   Farxiga  10 MG Tabs tablet Generic drug: dapagliflozin   propanediol Take 10 mg by mouth in the morning.   fenofibrate  54 MG tablet Take 1 tablet (54 mg total) by mouth daily.   Fiber 625 MG Tabs Take 625 mg by mouth daily.   folic acid  1 MG tablet Commonly known as: FOLVITE  TAKE 2 TABLETS BY MOUTH EVERY DAY   freestyle lancets USE ONCE A DAY TO CHECK BLOOD SUGAR.   FREESTYLE LITE test strip Generic drug: glucose blood USE TO TEST BLOOD SUGAR ONCE A DAY. DX CODE: E11.9   gabapentin  100 MG capsule Commonly known as: NEURONTIN  Take 100 mg by mouth at bedtime. PRN   glimepiride  2 MG tablet Commonly known as:  AMARYL  TAKE 1 TABLET (2 MG TOTAL) BY MOUTH IN THE MORNING AND AT BEDTIME.   ICY HOT ADVANCED PAIN RELIEF EX Apply 1 application  topically daily as needed (to painful sites).   metoprolol  succinate 25 MG 24 hr tablet Commonly known as: TOPROL -XL TAKE 1 TABLET (25 MG TOTAL) BY MOUTH DAILY.   nitroGLYCERIN  0.4 MG SL tablet Commonly known as: NITROSTAT  PLACE 1 TABLET UNDER THE TONGUE EVERY 5 MINUTES AS NEEDED FOR CHEST PAIN.   potassium chloride  SA 20 MEQ tablet Commonly known as: KLOR-CON  M TAKE 2 TABLETS BY MOUTH EVERY DAY   pregabalin  150 MG capsule Commonly known as: LYRICA  1 po bid   psyllium 58.6 % powder Commonly known as: METAMUCIL Take 1 packet by mouth daily as needed (for constipation- mix and drink).   torsemide  20 MG tablet Commonly known as: DEMADEX  TAKE 2 TAB BY MOUTH IN MORNING TAKE EXTRA TABLET FOR WEIGHT GAIN MORE THAN 2-3LBS IN 24HR FOR 90DAY   Trulicity  4.5 MG/0.5ML Soaj Generic drug: Dulaglutide  INJECT 4.5 MG INTO THE SKIN ONCE A WEEK.   Vascepa  1 g capsule Generic drug: icosapent  Ethyl TAKE 2 CAPSULES BY MOUTH 2 TIMES DAILY. (DAW9 VASCEPA )   VITAMIN C  PO Take 1 tablet by mouth daily with breakfast.        Allergies:  Allergies  Allergen Reactions   Ace Inhibitors Swelling and Other (See Comments)    Angioedema    Benazepril Swelling and Other (See Comments)    Angioedema; he is not a candidate for any angiotensin receptor blockers because of this significant allergic reaction. Because of a history of documented adverse serious drug reaction;Medi Alert bracelet  is recommended   Entresto  [Sacubitril -Valsartan ] Swelling and Other (See Comments)    First-in-Class Angiotensin Receptor Neprilysin Inhibitor- Med was red-flagged by the patient's pharmacy for him to NOT take!   Hctz [Hydrochlorothiazide] Anaphylaxis and Swelling    Tongue and lip swelling    Aspirin  Other (See Comments)    Gastritis, can aspirin  not take 325 mg aspirin      Past Medical History, Surgical history, Social history, and Family History were reviewed and updated.  Review of Systems: All other 10 point review of systems is negative.   Physical Exam:  height is 5' 10 (1.778 m) and weight is 230 lb (104.3 kg). His oral temperature is 97.5 F (36.4 C) (abnormal). His blood pressure is 133/76 and his pulse is 81. His respiration is 20 and oxygen  saturation is 96%.   Wt Readings from Last 3 Encounters:  05/11/24 230 lb (104.3 kg)  04/22/24 232 lb 9.6 oz (105.5 kg)  04/13/24 234 lb 1.9 oz (106.2 kg)    Ocular: Sclerae unicteric, pupils equal, round and reactive to light Ear-nose-throat: Oropharynx clear, dentition fair Lymphatic: No cervical or supraclavicular adenopathy Lungs no rales or rhonchi,  good excursion bilaterally Heart regular rate and rhythm, no murmur appreciated Abd soft, nontender, positive bowel sounds MSK no focal spinal tenderness, no joint edema Neuro: non-focal, well-oriented, appropriate affect Breasts: Deferred   Lab Results  Component Value Date   WBC 9.5 05/11/2024   HGB 15.4 05/11/2024   HCT 47.1 05/11/2024   MCV 91.5 05/11/2024   PLT 185 05/11/2024   Lab Results  Component Value Date   FERRITIN 1,354 (H) 04/13/2024   IRON 70 04/13/2024   TIBC 392 04/13/2024   UIBC 322 04/13/2024   IRONPCTSAT 18 04/13/2024   Lab Results  Component Value Date   RETICCTPCT 1.7 04/07/2023   RBC 5.15 05/11/2024   RETICCTABS 52.1 11/22/2011   No results found for: JONATHAN BONG [facility] Lab Results  Component Value Date   IGA 159 03/09/2012   Lab Results  Component Value Date   ALBUMINELP 4.2 07/27/2018   MSPIKE Not Observed 07/27/2018     Chemistry      Component Value Date/Time   NA 143 05/11/2024 0943   NA 138 02/08/2022 0811   NA 144 06/27/2017 0857   K 4.7 05/11/2024 0943   K 3.9 06/27/2017 0857   CL 102 05/11/2024 0943   CL 103 06/27/2017 0857   CO2 28 05/11/2024 0943   CO2 26  06/27/2017 0857   BUN 50 (H) 05/11/2024 0943   BUN 46 (H) 02/08/2022 0811   BUN 12 06/27/2017 0857   CREATININE 1.67 (H) 05/11/2024 0943   CREATININE 2.01 (H) 12/19/2023 1443      Component Value Date/Time   CALCIUM  10.0 05/11/2024 0943   CALCIUM  9.2 06/27/2017 0857   ALKPHOS 93 05/11/2024 0943   ALKPHOS 128 (H) 06/27/2017 0857   AST 33 05/11/2024 0943   ALT 38 05/11/2024 0943   ALT 24 06/27/2017 0857   BILITOT 0.6 05/11/2024 0943       Impression and Plan: Mr. Dec is a very pleasant 83 yo caucasian gentleman with diffuse large B-cell lymphoma (not double hit lymphoma). He completed treatment in February 2019.  We will proceed with B 12 and Xgeva  injections.  Iron studies pending. We will replace if needed.  CBC and CMP routed to Dr. ALONSO Stank with nephrology.  Follow-up in 1 month.    Lauraine Pepper, NP 11/11/202511:01 AM

## 2024-05-12 ENCOUNTER — Ambulatory Visit: Admitting: Pharmacist

## 2024-05-12 DIAGNOSIS — I25810 Atherosclerosis of coronary artery bypass graft(s) without angina pectoris: Secondary | ICD-10-CM

## 2024-05-12 DIAGNOSIS — E785 Hyperlipidemia, unspecified: Secondary | ICD-10-CM

## 2024-05-12 MED ORDER — TIRZEPATIDE 7.5 MG/0.5ML ~~LOC~~ SOAJ: 7.5000 mg | SUBCUTANEOUS | 0 refills | Status: DC

## 2024-05-12 NOTE — Progress Notes (Signed)
 05/12/2024 Name: Richard [name] MRN: [id number] DOB: 1940/08/30  Chief Complaint  Patient presents with   Hyperlipidemia   Medication Management   Diabetes    Richard [name] is a 83 y.o. year old male who presented for a telephone visit.   They were referred to the pharmacist by their PCP for assistance in managing diabetes, hyperlipidemia/cardiovascular risk reduction, and complex medication management.    Subjective:  Care Team: Primary Care Provider: Antonio [name], Richard SAUNDERS, Richard [name] ; Next Scheduled Visit: not yet scheduled/ Last visit was 03/18/2024 Cardiologist: lipid specialist - Richard [name]; Next Scheduled Visit: initial assessment 03/2024 Dermatologist: Richard [name]: Next appointment 07/25/2024 Hematology; Next appointment: 06/15/2024  Medication Access/Adherence  Current Pharmacy:  [id number] [place], [place] - [address] [place] [place] [address] Phone: [phone] Fax: [phone]  [facility] [address] [place] [address] Phone: [phone] Fax: [phone]   Patient reports affordability concerns with their medications: Yes  Patient reports access/transportation concerns to their pharmacy: No  Patient reports adherence concerns with their medications:  No      Diabetes:  Current medications: Trulicity  4.5mg  weekly, glimepiride  2mg  daily, Farxiga  10mg  daily  Past therapies: metformin  stopped due to rising Scr.  Initially recommended Ozempic  instead of Trulicity  but Trulicity  was preferred by patient's insurance.  Current glucose readings: 135 lowest; highest 175  Patient denies hypoglycemic s/sx including no dizziness, shakiness, sweating. Patient denies hyperglycemic symptoms including no polyuria, polydipsia, polyphagia, nocturia, neuropathy, blurred vision.  Patient reports he has been having constipation.  Patient states BM has been firm and he has to  strain. Having BM about every other day.  Cancer nurse recommended he try Miarlax 1 or 2 times per day but patient has not started yet.   Macrovascular and Microvascular Risk Reduction:  Statin? yes (atorvastatin  80mg  daily ); ACEi/ARB? No - intolerant / history of angioedema Last urinary albumin /creatinine ratio:  Lab Results  Component Value Date   MICRALBCREAT 21.4 03/18/2024   MICRALBCREAT 5 12/19/2023   MICRALBCREAT <16 11/07/2023   MICRALBCREAT 23 08/07/2023   MICRALBCREAT NOTE 04/18/2020   MICRALBCREAT 5 03/15/2016   MICRALBCREAT 3.1 08/15/2009   MICRALBCREAT 2.9 12/15/2008   MICRALBCREAT 2.8 08/15/2008   MICRALBCREAT 1.8 05/26/2006   Last eye exam:  Lab Results  Component Value Date   HMDIABEYEEXA No Retinopathy 08/19/2023   Last foot exam: No foot exam found Tobacco Use:  Tobacco Use: Low Risk  (05/11/2024)   Patient History    Smoking Tobacco Use: Never    Smokeless Tobacco Use: Never    Passive Exposure: Never   Hyperlipidemia/ASCVD Risk Reduction  Current lipid lowering medications: atorvastatin  80mg  daily, Vasepa 4 per day, ezetimibe  10mg  daily and fenofibrate  54mg  daily (dose was recently lowered based on eGFR)   Patient had initial visit with Richard [name] 04/22/2024 for elevated triglycerides. Richard [name] noted - unlikely he has a genetic familial chylomicronemia syndrome and therefore would not be a candidate at this time for Tryngolza. Expanding approval however for this medicine is expected in the next few years.   Current physical activity: golfs weekly  Current medication access support: [employer] thru 06/02/2024    Objective:  Lab Results  Component Value Date   HGBA1C 7.8 (H) 03/18/2024    Lab Results  Component Value Date   CREATININE 1.67 (H) 05/11/2024   BUN 50 (H) 05/11/2024   NA 143 05/11/2024  K 4.7 05/11/2024   CL 102 05/11/2024   CO2 28 05/11/2024    Lab Results  Component Value Date   CHOL 182 03/18/2024   HDL 32.00 (L)  03/18/2024   LDLCALC  12/19/2023     Comment:     . LDL cholesterol not calculated. Triglyceride levels greater than 400 mg/dL invalidate calculated LDL results. . Reference range: <100 . Desirable range <100 mg/dL for primary prevention;   <70 mg/dL for patients with CHD or diabetic patients  with > or = 2 CHD risk factors. SABRA LDL-C is now calculated using the Martin-Hopkins  calculation, which is a validated novel method providing  better accuracy than the Friedewald equation in the  estimation of LDL-C.  Richard [name] et al. Richard [name]. 7986;689(80): 2061-2068  (http://education.[employer].com/faq/FAQ164)    LDLDIRECT 52.0 03/18/2024   TRIG (H) 03/18/2024    726.0 Triglyceride is over 400; calculations on Lipids are invalid.   CHOLHDL 6 03/18/2024    Medications Reviewed Today     Reviewed by Richard [name], Richard [name] (Pharmacist) on 05/12/24 at 1333  Med List Status: <None>   Medication Order Taking? Sig Documenting Provider Last Dose Status Informant  acetaminophen  (TYLENOL ) 325 MG tablet [id number] Yes Take 650 mg by mouth at bedtime as needed for moderate pain (pain score 4-6) or headache. Provider, Historical, Richard [name]  Active Self  allopurinol  (ZYLOPRIM ) 300 MG tablet [id number] Yes Take 450 mg by mouth daily. Provider, Historical, Richard [name]  Active Self           Med Note Richard [name]   Wed Jan 16, 2022  2:57 PM) 1.5 tablets = 450 mg  aluminum hydroxide-magnesium  carbonate (GAVISCON) 95-358 MG/15ML SUSP [id number]  Take 15 mLs by mouth as needed for indigestion or heartburn. Provider, Historical, Richard [name]  Active Self  amoxicillin  (AMOXIL ) 500 MG capsule [id number] Yes Take 500 mg by mouth 2 (two) times daily. Provider, Historical, Richard [name]  Active            Med Note (Richard [name], Richard [name]   Tue Nov 04, 2023 10:31 AM) Pt advised he is taking 2 tablets daily  Ascorbic Acid  (VITAMIN C  PO) [id number] Yes Take 1 tablet by mouth daily with breakfast. Provider, Historical, Richard [name]  Active Self  atorvastatin  (LIPITOR ) 80  MG tablet [id number] Yes TAKE 1 TABLET BY MOUTH EVERYDAY AT BEDTIME Richard Agent, Richard [name]  Active   Azelastine  HCl 137 MCG/SPRAY SOLN [id number]  PLACE 2 SPRAYS INTO BOTH NOSTRILS AT BEDTIME AS NEEDED FOR RHINITIS OR ALLERGIES. Richard [name], Yvonne R, Richard [name]  Active   Calcium  Polycarbophil (FIBER) 625 MG TABS [id number]  Take 625 mg by mouth daily.  Patient not taking: Reported on 05/12/2024   Provider, Historical, Richard [name]  Active   Coenzyme Q10 (CO Q-10 PO) [id number] Yes Take 100 mg by mouth daily. Provider, Historical, Richard [name]  Active Self  Dulaglutide  (TRULICITY ) 4.5 MG/0.5ML EMMANUEL [id number] Yes INJECT 4.5 MG INTO THE SKIN ONCE A WEEK. Yacopino, Jessica L, NP  Active   ELIQUIS  2.5 MG TABS tablet [id number] Yes TAKE 1 TABLET BY MOUTH TWICE A DAY Hochrein, James, Richard [name]  Active   esomeprazole  (NEXIUM ) 40 MG capsule [id number]  Take 1 capsule (40 mg total) by mouth daily. Tish Elsie FALCON, Richard [name]  Active Self  ezetimibe  (ZETIA ) 10 MG tablet [id number] Yes Take 1 tablet (10 mg total) by mouth daily. Pt needs office visit for further refills Richard [name] Richard JONELLE, Richard [name]  Active   FARXIGA  10 MG TABS tablet [id number] Yes Take 10 mg  by mouth in the morning. Provider, Historical, Richard [name]  Active Self  fenofibrate  54 MG tablet [id number] Yes Take 1 tablet (54 mg total) by mouth daily. [name] Vinie BROCKS, Richard [name]  Active   folic acid  (FOLVITE ) 1 MG tablet [id number]  TAKE 2 TABLETS BY MOUTH EVERY DAY Tonette Lauraine CHRISTELLA DEVONNA  Active   FREESTYLE LITE test strip [id number]  USE TO TEST BLOOD SUGAR ONCE A DAY. DX CODE: E11.9 Lowne Chase, Yvonne R, Richard [name]  Active   gabapentin  (NEURONTIN ) 100 MG capsule [id number]  Take 100 mg by mouth at bedtime. PRN Provider, Historical, Richard [name]  Active            Med Note RUDINE, MARKIS O   Thu Sep 11, 2023  8:07 AM) When needed and did not take for 3 months.  glimepiride  (AMARYL ) 2 MG tablet [id number] Yes TAKE 1 TABLET (2 MG TOTAL) BY MOUTH IN THE MORNING AND AT BEDTIME. Richard [name], Yvonne R, Richard [name]  Active   Lancets (FREESTYLE) lancets  [id number]  USE ONCE A DAY TO CHECK BLOOD SUGAR. Richard [name], Yvonne R, Richard [name]  Active   Menthol -Camphor (ICY HOT ADVANCED PAIN RELIEF EX) [id number]  Apply 1 application  topically daily as needed (to painful sites). Provider, Historical, Richard [name]  Active Self  metoprolol  succinate (TOPROL -XL) 25 MG 24 hr tablet [id number] Yes TAKE 1 TABLET (25 MG TOTAL) BY MOUTH DAILY. Richard Agent, Richard [name]  Active   nitroGLYCERIN  (NITROSTAT ) 0.4 MG SL tablet [id number]  PLACE 1 TABLET UNDER THE TONGUE EVERY 5 MINUTES AS NEEDED FOR CHEST PAIN. Richard Agent, Richard [name]  Active   potassium chloride  SA (KLOR-CON  M) 20 MEQ tablet [id number] Yes TAKE 2 TABLETS BY MOUTH EVERY DAY Richard Agent, Richard [name]  Active   pregabalin  (LYRICA ) 150 MG capsule [id number] Yes 1 po bid Richard [name], Yvonne R, Richard [name]  Active   psyllium (METAMUCIL) 58.6 % powder [id number]  Take 1 packet by mouth daily as needed (for constipation- mix and drink).  Patient not taking: Reported on 05/12/2024   Provider, Historical, Richard [name]  Active Self  torsemide  (DEMADEX ) 20 MG tablet [id number] Yes TAKE 2 TAB BY MOUTH IN MORNING TAKE EXTRA TABLET FOR WEIGHT GAIN MORE THAN 2-3LBS IN 24HR FOR 90DAY Lowne Chase, Yvonne R, Richard [name]  Active   VASCEPA  1 g capsule [id number] Yes TAKE 2 CAPSULES BY MOUTH 2 TIMES DAILY. (DAW9 VASCEPA ) Richard [name] Richard JONELLE, Richard [name]  Active   Med List Note Montine Riggs, CPhT 06/10/13 9152): cpap machine 2 L of oxygen  at night             Card No.: [id number] BIN: [id number] PCN: [id number] PC Group; [id number]  Assessment/Plan:   Diabetes: - Currently uncontrolled; goal A1c <7%. Cardiorenal risk reduction is optimized.. Blood pressure is at goal <130/80. LDL is at goal.  - Reviewed long term cardiovascular and renal outcomes of uncontrolled blood sugar. and Reviewed goal A1c, goal fasting, and goal 2 hour post prandial glucose. Recommended to check glucose 1 to 2 times per day - Change Trulicity  4.5mg  weekly to Mounjaro 7.5mg  weekly - Continue glimepiride  and Farxiga  at  current dose.  - Discussed side effects of gastrointestinal upset/nausea; eating smaller meals, avoiding high-fat foods, and remaining upright after eating may reduce nausea. Discussed that overeating is a major trigger of nausea with this class of medications, as often times patients will start to feel full sooner and may need to decrease portion sizes from what they were previously accustomed to.  Discussed using Miralax  as needed for constipation.  Hyperlipidemia/ASCVD Risk Reduction: - Currently uncontrolled. LDL at goal of < 55 but triglycerides remain elevated.  - Reviewed long term complications of uncontrolled cholesterol - Recommend to contnue current therapy. Might see improved triglycerides with change to Mounjaro.   - Meets financial criteria for [employer] - renewed today thru 06/02/2025   Follow Up Plan: 1 month  Madelin Ray, PharmD Clinical Pharmacist [facility]

## 2024-05-23 DIAGNOSIS — Z950 Presence of cardiac pacemaker: Secondary | ICD-10-CM | POA: Diagnosis not present

## 2024-06-03 DIAGNOSIS — Z6833 Body mass index (BMI) 33.0-33.9, adult: Secondary | ICD-10-CM | POA: Diagnosis not present

## 2024-06-03 DIAGNOSIS — M1A09X0 Idiopathic chronic gout, multiple sites, without tophus (tophi): Secondary | ICD-10-CM | POA: Diagnosis not present

## 2024-06-03 DIAGNOSIS — G5793 Unspecified mononeuropathy of bilateral lower limbs: Secondary | ICD-10-CM | POA: Diagnosis not present

## 2024-06-03 DIAGNOSIS — M25551 Pain in right hip: Secondary | ICD-10-CM | POA: Diagnosis not present

## 2024-06-03 DIAGNOSIS — M1991 Primary osteoarthritis, unspecified site: Secondary | ICD-10-CM | POA: Diagnosis not present

## 2024-06-03 DIAGNOSIS — E669 Obesity, unspecified: Secondary | ICD-10-CM | POA: Diagnosis not present

## 2024-06-04 ENCOUNTER — Other Ambulatory Visit: Payer: Self-pay | Admitting: Family Medicine

## 2024-06-07 ENCOUNTER — Other Ambulatory Visit: Admitting: Pharmacist

## 2024-06-07 VITALS — Ht 71.0 in | Wt 217.0 lb

## 2024-06-07 DIAGNOSIS — E785 Hyperlipidemia, unspecified: Secondary | ICD-10-CM

## 2024-06-07 DIAGNOSIS — E1165 Type 2 diabetes mellitus with hyperglycemia: Secondary | ICD-10-CM

## 2024-06-07 MED ORDER — TIRZEPATIDE 7.5 MG/0.5ML ~~LOC~~ SOAJ: 7.5000 mg | SUBCUTANEOUS | 0 refills | Status: AC

## 2024-06-07 NOTE — Progress Notes (Signed)
 06/07/2024 Name: Richard [name] MRN: [id number] DOB: 1940-12-26  Chief Complaint  Patient presents with   Diabetes   Medication Management    Richard [name] is a 83 y.o. year old male who presented for a telephone visit.   They were referred to the pharmacist by their PCP for assistance in managing diabetes, hyperlipidemia/cardiovascular risk reduction, and complex medication management.    Subjective:  Care Team: Primary Care Provider: Antonio Meth, Jamee SAUNDERS, DO ; Next Scheduled Visit: not yet scheduled/ Last visit was 03/18/2024 Cardiologist: lipid specialist - Dr Mona; Next Scheduled Visit: recalled set for June 2026 Dermatologist: Dr Jackquline: Next appointment 07/25/2024 Hematology; Next appointment: 06/15/2024  Medication Access/Adherence  Current Pharmacy:  [id number] [place], [place] - [address] [place] [address] Phone: [phone] Fax: [phone]  [facility] [address] [place] [address] Phone: [phone] Fax: [phone]   Patient reports affordability concerns with their medications: Yes  Patient reports access/transportation concerns to their pharmacy: No  Patient reports adherence concerns with their medications:  No      Diabetes:  Current medications: Mounjaro  7.5mg  weekly (has taken 4 doses so far), glimepiride  2mg  daily, Farxiga  10mg  daily  Past therapies: metformin  stopped due to rising Scr. Trulicity  - changed to see if better efficacy with Mounjaro  Initially recommended Ozempic  instead of Trulicity  but Trulicity  was preferred by patient's insurance.  Current glucose readings:  Average over the last 2 weeks has been 148   Patient denies hypoglycemic s/sx including no dizziness, shakiness, sweating. Patient denies hyperglycemic symptoms including no polyuria, polydipsia, polyphagia, nocturia, , blurred vision but he does report that he  has had increase in neuropathy. He is currently taking pregabalin  150mg  twice a day for neuropathy. He reports he took gabapentin  in the past 100mg  at night when he was at [place] and this helped his neuropathy a lot. He asked if he could restart it.   Patient reports constipation has improved since our last visit. Patient is taking daily probiotic.   Wt Readings from Last 3 Encounters:  06/07/24 217 lb (98.4 kg)  05/11/24 230 lb (104.3 kg)  04/22/24 232 lb 9.6 oz (105.5 kg)   Patient has lost about 13 to 15 lbs since he started Mounjaro .   Macrovascular and Microvascular Risk Reduction:  Statin? yes (atorvastatin  80mg  daily ); ACEi/ARB? No - intolerant / history of angioedema Last urinary albumin /creatinine ratio:  Lab Results  Component Value Date   MICRALBCREAT 21.4 03/18/2024   MICRALBCREAT 5 12/19/2023   MICRALBCREAT <16 11/07/2023   MICRALBCREAT 23 08/07/2023   MICRALBCREAT NOTE 04/18/2020   MICRALBCREAT 5 03/15/2016   MICRALBCREAT 3.1 08/15/2009   MICRALBCREAT 2.9 12/15/2008   MICRALBCREAT 2.8 08/15/2008   MICRALBCREAT 1.8 05/26/2006   Last eye exam:  Lab Results  Component Value Date   HMDIABEYEEXA No Retinopathy 08/19/2023   Last foot exam: No foot exam found Tobacco Use:  Tobacco Use: Low Risk  (06/07/2024)   Patient History    Smoking Tobacco Use: Never    Smokeless Tobacco Use: Never    Passive Exposure: Never   Hyperlipidemia/ASCVD Risk Reduction  Current lipid lowering medications: atorvastatin  80mg  daily, Vasepa 4 per day, ezetimibe  10mg  daily and fenofibrate  54mg  daily (dose was recently lowered based on eGFR)   Patient had initial visit with Dr Mona 04/22/2024 for elevated triglycerides. Dr Mona noted - unlikely he has a genetic familial chylomicronemia syndrome  and therefore would not be a candidate at this time for [facility]. Expanding approval however for this medicine is expected in the next few years.   Current physical activity: golfs  weekly  Current medication access support: [employer] thru 06/02/2025    Objective:  Lab Results  Component Value Date   HGBA1C 7.8 (H) 03/18/2024    Lab Results  Component Value Date   CREATININE 1.67 (H) 05/11/2024   BUN 50 (H) 05/11/2024   NA 143 05/11/2024   K 4.7 05/11/2024   CL 102 05/11/2024   CO2 28 05/11/2024    Lab Results  Component Value Date   CHOL 182 03/18/2024   HDL 32.00 (L) 03/18/2024   LDLCALC  12/19/2023     Comment:     . LDL cholesterol not calculated. Triglyceride levels greater than 400 mg/dL invalidate calculated LDL results. . Reference range: <100 . Desirable range <100 mg/dL for primary prevention;   <70 mg/dL for patients with CHD or diabetic patients  with > or = 2 CHD risk factors. SABRA LDL-C is now calculated using the Martin-Hopkins  calculation, which is a validated novel method providing  better accuracy than the Friedewald equation in the  estimation of LDL-C.  Gladis APPLETHWAITE et al. SANDREA. 7986;689(80): 2061-2068  (http://education.Quest[employer].com/faq/FAQ164)    LDLDIRECT 52.0 03/18/2024   TRIG (H) 03/18/2024    726.0 Triglyceride is over 400; calculations on Lipids are invalid.   CHOLHDL 6 03/18/2024    Medications Reviewed Today     Reviewed by Carla Milling, RPH-CPP (Pharmacist) on 06/07/24 at 1424  Med List Status: <None>   Medication Order Taking? Sig Documenting Provider Last Dose Status Informant  acetaminophen  (TYLENOL ) 325 MG tablet [id number]  Take 650 mg by mouth at bedtime as needed for moderate pain (pain score 4-6) or headache. [provider]  Active Self  allopurinol  (ZYLOPRIM ) 300 MG tablet [id number] Yes Take 450 mg by mouth daily. [provider]  Active Self           Med Note MARISA, NATHANEL SAILOR   Wed Jan 16, 2022  2:57 PM) 1.5 tablets = 450 mg  aluminum hydroxide-magnesium  carbonate (GAVISCON) 95-358 MG/15ML SUSP [id number]  Take 15 mLs by mouth as needed for indigestion or heartburn.  [provider]  Active Self  amoxicillin  (AMOXIL ) 500 MG capsule [id number] Yes Take 500 mg by mouth 2 (two) times daily. [provider]  Active            Med Note (GARNER, [facility]   Tue Nov 04, 2023 10:31 AM) Pt advised he is taking 2 tablets daily  Ascorbic Acid  (VITAMIN C  PO) [id number] Yes Take 1 tablet by mouth daily with breakfast. [provider]  Active Self  atorvastatin  (LIPITOR ) 80 MG tablet [id number] Yes TAKE 1 TABLET BY MOUTH EVERYDAY AT BEDTIME Lavona Agent, MD  Active   Azelastine  HCl 137 MCG/SPRAY SOLN [id number]  PLACE 2 SPRAYS INTO BOTH NOSTRILS AT BEDTIME AS NEEDED FOR RHINITIS OR ALLERGIES. Antonio Meth, Yvonne R, DO  Active   Calcium  Polycarbophil (FIBER) 625 MG TABS [id number]  Take 625 mg by mouth daily.  Patient not taking: Reported on 06/07/2024   [provider]  Active   Coenzyme Q10 (CO Q-10 PO) [id number]  Take 100 mg by mouth daily. [provider]  Active Self  ELIQUIS  2.5 MG TABS tablet [id number] Yes TAKE 1 TABLET BY MOUTH TWICE A DAY Hochrein, James, MD  Active   esomeprazole  (NEXIUM ) 40 MG  capsule [id number]  Take 1 capsule (40 mg total) by mouth daily. Tish Elsie FALCON, MD  Active Self  ezetimibe  (ZETIA ) 10 MG tablet [id number] Yes Take 1 tablet (10 mg total) by mouth daily. Pt needs office visit for further refills Antonio Cyndee Jamee JONELLE, DO  Active   FARXIGA  10 MG TABS tablet [id number] Yes Take 10 mg by mouth in the morning. [provider]  Active Self  fenofibrate  54 MG tablet [id number] Yes Take 1 tablet (54 mg total) by mouth daily. Mona Vinie BROCKS, MD  Active   folic acid  (FOLVITE ) 1 MG tablet [id number] Yes TAKE 2 TABLETS BY MOUTH EVERY DAY Tonette Lauraine CHRISTELLA DEVONNA  Active   FREESTYLE LITE test strip [id number] Yes USE TO TEST BLOOD SUGAR ONCE A DAY. DX CODE: E11.9 Lowne Chase, Yvonne R, DO  Active    Patient not taking:   Discontinued 06/07/24 1423 (Patient has not taken in last 30 days)            Med  Note>> Taffy Lovey KIDD, CMA   09/11/2023  8:07 AM Taking pregabalin  / Lyrica     glimepiride  (AMARYL ) 2 MG tablet [id number] Yes TAKE 1 TABLET (2 MG TOTAL) BY MOUTH IN THE MORNING AND AT BEDTIME. Antonio Cyndee, Yvonne R, DO  Active   Lancets (FREESTYLE) lancets [id number] Yes USE ONCE A DAY TO CHECK BLOOD SUGAR. Antonio Cyndee, Yvonne R, DO  Active   Menthol -Camphor (ICY HOT ADVANCED PAIN RELIEF EX) [id number]  Apply 1 application  topically daily as needed (to painful sites). [provider]  Active Self  metoprolol  succinate (TOPROL -XL) 25 MG 24 hr tablet [id number] Yes TAKE 1 TABLET (25 MG TOTAL) BY MOUTH DAILY. Lavona Agent, MD  Active   nitroGLYCERIN  (NITROSTAT ) 0.4 MG SL tablet [id number] Yes PLACE 1 TABLET UNDER THE TONGUE EVERY 5 MINUTES AS NEEDED FOR CHEST PAIN. Lavona Agent, MD  Active   potassium chloride  SA (KLOR-CON  M) 20 MEQ tablet [id number] Yes TAKE 2 TABLETS BY MOUTH EVERY DAY Lavona Agent, MD  Active   pregabalin  (LYRICA ) 150 MG capsule [id number] Yes 1 po bid Antonio Cyndee, Yvonne R, DO  Active   psyllium (METAMUCIL) 58.6 % powder [id number]  Take 1 packet by mouth daily as needed (for constipation- mix and drink).  Patient not taking: Reported on 06/07/2024   [provider]  Active Self  tirzepatide  (MOUNJARO ) 7.5 MG/0.5ML Pen [id number] Yes Inject 7.5 mg into the skin once a week. Antonio Cyndee, Yvonne R, DO  Active   torsemide  (DEMADEX ) 20 MG tablet [id number] Yes TAKE 2 TAB BY MOUTH IN MORNING TAKE EXTRA TABLET FOR WEIGHT GAIN MORE THAN 2-3LBS IN [facility] FOR 90DAY Lowne Chase, Yvonne R, DO  Active   VASCEPA  1 g capsule [id number] Yes TAKE 2 CAPSULES BY MOUTH 2 TIMES DAILY. (DAW9 VASCEPA ) Antonio Cyndee Jamee JONELLE, DO  Active   Med List Note Montine Riggs, CPhT 06/10/13 9152): cpap machine 2 L of oxygen  at night             Card No.: [id number] BIN: [id number] PCN: [id number] PC Group; [id number]  Assessment/Plan:   Diabetes: - Currently uncontrolled; goal A1c <7%.  Cardiorenal risk reduction is optimized.. Blood pressure is at goal <130/80. LDL is at goal.  - Reviewed long term cardiovascular and renal outcomes of uncontrolled blood sugar. and Reviewed goal A1c, goal fasting, and goal 2 hour post prandial glucose. Recommended to check glucose 1 to 2 times per day - Continue Mounjaro  7.5mg  weekly -  updated Rx - Continue glimepiride  and Farxiga  at current dose.  - Reviewed side effects of gastrointestinal upset/nausea; eating smaller meals, avoiding high-fat foods, and remaining upright after eating may reduce nausea. Discussed that overeating is a major trigger of nausea with this class of medications, as often times patients will start to feel full sooner and may need to decrease portion sizes from what they were previously accustomed to.   - Set up appointment for patient to see PCP to discuss worsening neuropathy and treatment. Appt 06/17/2024 at 9am.   Hyperlipidemia/ASCVD Risk Reduction: - Currently uncontrolled. LDL at goal of < 55 but triglycerides remain elevated.  - Reviewed long term complications of uncontrolled cholesterol - Recommend to contnue current therapy. Might see improved triglycerides with change to Mounjaro .   - Meets financial criteria for [employer] - renewed thru 06/02/2025  Medication Management:  - Reviewed dose of Eliquis  - 2.5mg  twice a day (adjusted based on Scr = 1.67 and age = 83yo; wt was 98.4kg)   Follow Up Plan: 1 month  Madelin Ray, PharmD Clinical Pharmacist [facility]

## 2024-06-10 NOTE — Progress Notes (Signed)
 DCCV orders placed as per encounter (06/09/2024)

## 2024-06-15 ENCOUNTER — Inpatient Hospital Stay

## 2024-06-15 ENCOUNTER — Other Ambulatory Visit: Payer: Self-pay

## 2024-06-15 ENCOUNTER — Inpatient Hospital Stay: Admitting: Family

## 2024-06-15 ENCOUNTER — Encounter: Payer: Self-pay | Admitting: Family

## 2024-06-15 ENCOUNTER — Inpatient Hospital Stay: Attending: Hematology & Oncology

## 2024-06-15 VITALS — BP 141/74 | HR 62 | Temp 98.5°F | Wt 221.0 lb

## 2024-06-15 DIAGNOSIS — D518 Other vitamin B12 deficiency anemias: Secondary | ICD-10-CM | POA: Diagnosis not present

## 2024-06-15 DIAGNOSIS — D500 Iron deficiency anemia secondary to blood loss (chronic): Secondary | ICD-10-CM

## 2024-06-15 DIAGNOSIS — I4891 Unspecified atrial fibrillation: Secondary | ICD-10-CM | POA: Insufficient documentation

## 2024-06-15 DIAGNOSIS — Z950 Presence of cardiac pacemaker: Secondary | ICD-10-CM | POA: Insufficient documentation

## 2024-06-15 DIAGNOSIS — E611 Iron deficiency: Secondary | ICD-10-CM | POA: Diagnosis present

## 2024-06-15 DIAGNOSIS — D509 Iron deficiency anemia, unspecified: Secondary | ICD-10-CM

## 2024-06-15 DIAGNOSIS — D511 Vitamin B12 deficiency anemia due to selective vitamin B12 malabsorption with proteinuria: Secondary | ICD-10-CM

## 2024-06-15 DIAGNOSIS — C8332 Diffuse large B-cell lymphoma, intrathoracic lymph nodes: Secondary | ICD-10-CM | POA: Insufficient documentation

## 2024-06-15 DIAGNOSIS — Z7901 Long term (current) use of anticoagulants: Secondary | ICD-10-CM | POA: Diagnosis not present

## 2024-06-15 DIAGNOSIS — D519 Vitamin B12 deficiency anemia, unspecified: Secondary | ICD-10-CM

## 2024-06-15 DIAGNOSIS — D510 Vitamin B12 deficiency anemia due to intrinsic factor deficiency: Secondary | ICD-10-CM | POA: Insufficient documentation

## 2024-06-15 DIAGNOSIS — G629 Polyneuropathy, unspecified: Secondary | ICD-10-CM | POA: Diagnosis not present

## 2024-06-15 LAB — CMP (CANCER CENTER ONLY)
ALT: 67 U/L — ABNORMAL HIGH (ref 0–44)
AST: 38 U/L (ref 15–41)
Albumin: 4.6 g/dL (ref 3.5–5.0)
Alkaline Phosphatase: 100 U/L (ref 38–126)
Anion gap: 9 (ref 5–15)
BUN: 53 mg/dL — ABNORMAL HIGH (ref 8–23)
CO2: 31 mmol/L (ref 22–32)
Calcium: 9.5 mg/dL (ref 8.9–10.3)
Chloride: 101 mmol/L (ref 98–111)
Creatinine: 1.66 mg/dL — ABNORMAL HIGH (ref 0.61–1.24)
GFR, Estimated: 40 mL/min — ABNORMAL LOW (ref >60)
Glucose, Bld: 164 mg/dL — ABNORMAL HIGH (ref 70–99)
Potassium: 4.7 mmol/L (ref 3.5–5.1)
Sodium: 141 mmol/L (ref 135–145)
Total Bilirubin: 0.6 mg/dL (ref 0.0–1.2)
Total Protein: 7 g/dL (ref 6.5–8.1)

## 2024-06-15 LAB — CBC WITH DIFFERENTIAL (CANCER CENTER ONLY)
Abs Immature Granulocytes: 0.11 K/uL — ABNORMAL HIGH (ref 0.00–0.07)
Basophils Absolute: 0 K/uL (ref 0.0–0.1)
Basophils Relative: 0 %
Eosinophils Absolute: 0.1 K/uL (ref 0.0–0.5)
Eosinophils Relative: 2 %
HCT: 46.9 % (ref 39.0–52.0)
Hemoglobin: 15 g/dL (ref 13.0–17.0)
Immature Granulocytes: 1 %
Lymphocytes Relative: 18 %
Lymphs Abs: 1.6 K/uL (ref 0.7–4.0)
MCH: 29.5 pg (ref 26.0–34.0)
MCHC: 32 g/dL (ref 30.0–36.0)
MCV: 92.1 fL (ref 80.0–100.0)
Monocytes Absolute: 0.6 K/uL (ref 0.1–1.0)
Monocytes Relative: 7 %
Neutro Abs: 6.2 K/uL (ref 1.7–7.7)
Neutrophils Relative %: 72 %
Platelet Count: 158 K/uL (ref 150–400)
RBC: 5.09 MIL/uL (ref 4.22–5.81)
RDW: 16 % — ABNORMAL HIGH (ref 11.5–15.5)
WBC Count: 8.6 K/uL (ref 4.0–10.5)
nRBC: 0 % (ref 0.0–0.2)

## 2024-06-15 LAB — IRON AND IRON BINDING CAPACITY (CC-WL,HP ONLY)
Iron: 91 ug/dL (ref 45–182)
Saturation Ratios: 26 % (ref 17.9–39.5)
TIBC: 356 ug/dL (ref 250–450)
UIBC: 265 ug/dL

## 2024-06-15 MED ORDER — CYANOCOBALAMIN 1000 MCG/ML IJ SOLN
1000.0000 ug | Freq: Once | INTRAMUSCULAR | Status: AC
  Administered 2024-06-15: 09:00:00 1000 ug via INTRAMUSCULAR
  Filled 2024-06-15: qty 1

## 2024-06-15 NOTE — Patient Instructions (Signed)
 Vitamin B12 Deficiency Vitamin B12 deficiency means that your body does not have enough vitamin B12. The body needs this important vitamin: To make red blood cells. To make genes (DNA). To help the nerves work. If you do not have enough vitamin B12 in your body, you can have health problems, such as not having enough red blood cells in the blood (anemia). What are the causes? Not eating enough foods that contain vitamin B12. Not being able to take in (absorb) vitamin B12 from the food that you eat. Certain diseases. A condition in which the body does not make enough of a certain protein. This results in your body not taking in enough vitamin B12. Having a surgery in which part of the stomach or small intestine is taken out. Taking medicines that make it hard for the body to take in vitamin B12. These include: Heartburn medicines. Some medicines that are used to treat diabetes. What increases the risk? Being an older adult. Eating a vegetarian or vegan diet that does not include any foods that come from animals. Not eating enough foods that contain vitamin B12 while you are pregnant. Taking certain medicines. Having alcoholism. What are the signs or symptoms? In some cases, there are no symptoms. If the condition leads to too few blood cells or nerve damage, symptoms can occur, such as: Feeling weak or tired. Not being hungry. Losing feeling (numbness) or tingling in your hands and feet. Redness and burning of the tongue. Feeling sad (depressed). Confusion or memory problems. Trouble walking. If anemia is very bad, symptoms can include: Being short of breath. Being dizzy. Having a very fast heartbeat. How is this treated? Changing the way you eat and drink, such as: Eating more foods that contain vitamin B12. Drinking little or no alcohol. Getting vitamin B12 shots. Taking vitamin B12 supplements by mouth (orally). Your doctor will tell you the dose that is best for you. Follow  these instructions at home: Eating and drinking  Eat foods that come from animals and have a lot of vitamin B12 in them. These include: Meats and poultry. This includes beef, pork, chicken, [place], and organ meats, such as liver. Seafood, such as clams, rainbow trout, salmon, tuna, and haddock. Eggs. Dairy foods such as milk, yogurt, and cheese. Eat breakfast cereals that have vitamin B12 added to them (are fortified). Check the label. The items listed above may not be a complete list of foods and beverages you can eat and drink. Contact a dietitian for more information. Alcohol use Do not drink alcohol if: Your doctor tells you not to drink. You are pregnant, may be pregnant, or are planning to become pregnant. If you drink alcohol: Limit how much you have to: 0-1 drink a day for women. 0-2 drinks a day for men. Know how much alcohol is in your drink. In the [place] one drink equals one 12 oz bottle of beer (355 mL), one 5 oz glass of wine (148 mL), or one 1 oz glass of hard liquor (44 mL). General instructions Get any vitamin B12 shots if told by your doctor. Take supplements only as told by your doctor. Follow the directions. Keep all follow-up visits. Contact a doctor if: Your symptoms come back. Your symptoms get worse or do not get better with treatment. Get help right away if: You have trouble breathing. You have a very fast heartbeat. You have chest pain. You get dizzy. You faint. These symptoms may be an emergency. Get help right away. Call 911.  Do not wait to see if the symptoms will go away. Do not drive yourself to the hospital. Summary Vitamin B12 deficiency means that your body is not getting enough of the vitamin. In some cases, there are no symptoms of this condition. Treatment may include making a change in the way you eat and drink, getting shots, or taking supplements. Eat foods that have vitamin B12 in them. This information is not intended to replace advice  given to you by your health care provider. Make sure you discuss any questions you have with your health care provider. Document Revised: 02/09/2021 Document Reviewed: 02/09/2021 Elsevier Patient Education  2024 [employer].

## 2024-06-15 NOTE — Progress Notes (Signed)
 Hematology and Oncology Follow Up Visit  Richard [name] [id number] 07-Mar-1941 83 y.o. 06/15/2024   Principle Diagnosis:  Diffuse large cell non-Hodgkin's lymphoma (IPI = 3) - NOT double hit Pernicious anemia Iron deficiency secondary to bleeding   Past Therapy: R-CHOP - s/p cycle 8 - completed 08/2017   Current Therapy:        Vitamin B12 1 mg IM every month Xgeva  120 mg subcu q 3 months - next dose due in 08/2024 IV iron as indicated   Interim History:  [name] is here today for follow-up. He states that he is scheduled tomorrow for cardioversion with EP. He has a visual merchandiser and was told last week he was in atrial fib. Today heart rate and rhythm sound regular.  No c/o fever, chills, n/v, cough, rash, dizziness, SOB, chest pain, palpitations, abdominal pain or changes in bowel or bladder habits.  No swelling, tenderness in his extremities.  Neuropathy in his lower extremities unchanged from baseline.  No falls or syncope reported.  No blood loss.  Appetite and hydration are good. He states he has started Mounjaro  for hyperlipidemia. Weight is 221 lbs.   ECOG Performance Status: 1 - Symptomatic but completely ambulatory  Medications:  Allergies as of 06/15/2024       Reactions   Ace Inhibitors Swelling, Other (See Comments)   Angioedema   Benazepril Swelling, Other (See Comments)   Angioedema; he is not a candidate for any angiotensin receptor blockers because of this significant allergic reaction. Because of a history of documented adverse serious drug reaction;Medi Alert bracelet  is recommended   Entresto  [sacubitril -valsartan ] Swelling, Other (See Comments)   First-in-Class Angiotensin Receptor Neprilysin Inhibitor- Med was red-flagged by the patient's pharmacy for him to NOT take!   Hctz [hydrochlorothiazide] Anaphylaxis, Swelling   Tongue and lip swelling   Aspirin  Other (See Comments)   Gastritis, can aspirin  not take 325 mg aspirin         Medication  List        Accurate as of June 15, 2024  9:14 AM. If you have any questions, ask your nurse or doctor.          acetaminophen  325 MG tablet Commonly known as: TYLENOL  Take 650 mg by mouth at bedtime as needed for moderate pain (pain score 4-6) or headache.   allopurinol  300 MG tablet Commonly known as: ZYLOPRIM  Take 450 mg by mouth daily.   aluminum hydroxide-magnesium  carbonate 95-358 MG/15ML Susp Commonly known as: GAVISCON Take 15 mLs by mouth as needed for indigestion or heartburn.   amoxicillin  500 MG capsule Commonly known as: AMOXIL  Take 500 mg by mouth 2 (two) times daily.   atorvastatin  80 MG tablet Commonly known as: LIPITOR  TAKE 1 TABLET BY MOUTH EVERYDAY AT BEDTIME   Azelastine  HCl 137 MCG/SPRAY Soln PLACE 2 SPRAYS INTO BOTH NOSTRILS AT BEDTIME AS NEEDED FOR RHINITIS OR ALLERGIES.   CO Q-10 PO Take 100 mg by mouth daily.   Eliquis  2.5 MG Tabs tablet Generic drug: apixaban  TAKE 1 TABLET BY MOUTH TWICE A DAY   esomeprazole  40 MG capsule Commonly known as: NexIUM  Take 1 capsule (40 mg total) by mouth daily.   ezetimibe  10 MG tablet Commonly known as: ZETIA  Take 1 tablet (10 mg total) by mouth daily. Pt needs office visit for further refills   Farxiga  10 MG Tabs tablet Generic drug: dapagliflozin  propanediol Take 10 mg by mouth in the morning.   fenofibrate  54 MG tablet Take 1 tablet (54 mg  total) by mouth daily.   Fiber 625 MG Tabs Take 625 mg by mouth daily.   folic acid  1 MG tablet Commonly known as: FOLVITE  TAKE 2 TABLETS BY MOUTH EVERY DAY   freestyle lancets USE ONCE A DAY TO CHECK BLOOD SUGAR.   FREESTYLE LITE test strip Generic drug: glucose blood USE TO TEST BLOOD SUGAR ONCE A DAY. DX CODE: E11.9   glimepiride  2 MG tablet Commonly known as: AMARYL  TAKE 1 TABLET (2 MG TOTAL) BY MOUTH IN THE MORNING AND AT BEDTIME.   ICY HOT ADVANCED PAIN RELIEF EX Apply 1 application  topically daily as needed (to painful sites).    metoprolol  succinate 25 MG 24 hr tablet Commonly known as: TOPROL -XL TAKE 1 TABLET (25 MG TOTAL) BY MOUTH DAILY.   nitroGLYCERIN  0.4 MG SL tablet Commonly known as: NITROSTAT  PLACE 1 TABLET UNDER THE TONGUE EVERY 5 MINUTES AS NEEDED FOR CHEST PAIN.   potassium chloride  SA 20 MEQ tablet Commonly known as: KLOR-CON  M TAKE 2 TABLETS BY MOUTH EVERY DAY   pregabalin  150 MG capsule Commonly known as: LYRICA  1 po bid   psyllium 58.6 % powder Commonly known as: METAMUCIL Take 1 packet by mouth daily as needed (for constipation- mix and drink).   tirzepatide  7.5 MG/0.5ML Pen Commonly known as: MOUNJARO  Inject 7.5 mg into the skin once a week.   torsemide  20 MG tablet Commonly known as: DEMADEX  TAKE 2 TAB BY MOUTH IN MORNING TAKE EXTRA TABLET FOR WEIGHT GAIN MORE THAN 2-3LBS IN 24HR FOR 90DAY   Vascepa  1 g capsule Generic drug: icosapent  Ethyl TAKE 2 CAPSULES BY MOUTH 2 TIMES DAILY. (DAW9 VASCEPA )   VITAMIN C  PO Take 1 tablet by mouth daily with breakfast.        Allergies: Allergies[1]  Past Medical History, Surgical history, Social history, and Family History were reviewed and updated.  Review of Systems: All other 10 point review of systems is negative.   Physical Exam:  weight is 221 lb (100.2 kg). His oral temperature is 98.5 F (36.9 C). His blood pressure is 141/74 (abnormal) and his pulse is 62. His oxygen  saturation is 96%.   Wt Readings from Last 3 Encounters:  06/15/24 221 lb (100.2 kg)  06/07/24 217 lb (98.4 kg)  05/11/24 230 lb (104.3 kg)    Ocular: Sclerae unicteric, pupils equal, round and reactive to light Ear-nose-throat: Oropharynx clear, dentition fair Lymphatic: No cervical or supraclavicular adenopathy Lungs no rales or rhonchi, good excursion bilaterally Heart regular rate and rhythm, no murmur appreciated Abd soft, nontender, positive bowel sounds MSK no focal spinal tenderness, no joint edema Neuro: non-focal, well-oriented, appropriate  affect Breasts: Deferred   Lab Results  Component Value Date   WBC 8.6 06/15/2024   HGB 15.0 06/15/2024   HCT 46.9 06/15/2024   MCV 92.1 06/15/2024   PLT 158 06/15/2024   Lab Results  Component Value Date   FERRITIN 1,410 (H) 05/11/2024   IRON 98 05/11/2024   TIBC 403 05/11/2024   UIBC 305 05/11/2024   IRONPCTSAT 24 05/11/2024   Lab Results  Component Value Date   RETICCTPCT 1.7 04/07/2023   RBC 5.09 06/15/2024   RETICCTABS 52.1 11/22/2011   No results found for: JONATHAN BONG [facility] Lab Results  Component Value Date   IGA 159 03/09/2012   Lab Results  Component Value Date   ALBUMINELP 4.2 07/27/2018   MSPIKE Not Observed 07/27/2018     Chemistry      Component Value Date/Time  NA 141 06/15/2024 0810   NA 138 02/08/2022 0811   NA 144 06/27/2017 0857   K 4.7 06/15/2024 0810   K 3.9 06/27/2017 0857   CL 101 06/15/2024 0810   CL 103 06/27/2017 0857   CO2 31 06/15/2024 0810   CO2 26 06/27/2017 0857   BUN 53 (H) 06/15/2024 0810   BUN 46 (H) 02/08/2022 0811   BUN 12 06/27/2017 0857   CREATININE 1.66 (H) 06/15/2024 0810   CREATININE 2.01 (H) 12/19/2023 1443      Component Value Date/Time   CALCIUM  9.5 06/15/2024 0810   CALCIUM  9.2 06/27/2017 0857   ALKPHOS 100 06/15/2024 0810   ALKPHOS 128 (H) 06/27/2017 0857   AST 38 06/15/2024 0810   ALT 67 (H) 06/15/2024 0810   ALT 24 06/27/2017 0857   BILITOT 0.6 06/15/2024 0810       Impression and Plan: Mr. Gahm is a very pleasant 83 yo caucasian gentleman with diffuse large B-cell lymphoma (not double hit lymphoma). He completed treatment in February 2019.  We will proceed with B 12 injection  Iron studies pending. We will replace if needed.  CBC and CMP routed to Dr. ALONSO Stank with nephrology.  Follow-up in 1 month.    Lauraine Pepper, NP 12/16/20259:14 AM     [1]  Allergies Allergen Reactions   Ace Inhibitors Swelling and Other (See Comments)    Angioedema    Benazepril  Swelling and Other (See Comments)    Angioedema; he is not a candidate for any angiotensin receptor blockers because of this significant allergic reaction. Because of a history of documented adverse serious drug reaction;Medi Alert bracelet  is recommended   Entresto  [Sacubitril -Valsartan ] Swelling and Other (See Comments)    First-in-Class Angiotensin Receptor Neprilysin Inhibitor- Med was red-flagged by the patient's pharmacy for him to NOT take!   Hctz [Hydrochlorothiazide] Anaphylaxis and Swelling    Tongue and lip swelling    Aspirin  Other (See Comments)    Gastritis, can aspirin  not take 325 mg aspirin 

## 2024-06-17 ENCOUNTER — Encounter: Payer: Self-pay | Admitting: Family Medicine

## 2024-06-17 ENCOUNTER — Ambulatory Visit (INDEPENDENT_AMBULATORY_CARE_PROVIDER_SITE_OTHER): Admitting: Family Medicine

## 2024-06-17 ENCOUNTER — Other Ambulatory Visit

## 2024-06-17 VITALS — BP 120/78 | HR 72 | Temp 97.8°F | Resp 18 | Ht 71.0 in | Wt 224.2 lb

## 2024-06-17 DIAGNOSIS — E1169 Type 2 diabetes mellitus with other specified complication: Secondary | ICD-10-CM

## 2024-06-17 DIAGNOSIS — E1165 Type 2 diabetes mellitus with hyperglycemia: Secondary | ICD-10-CM

## 2024-06-17 DIAGNOSIS — E781 Pure hyperglyceridemia: Secondary | ICD-10-CM

## 2024-06-17 DIAGNOSIS — Z953 Presence of xenogenic heart valve: Secondary | ICD-10-CM | POA: Diagnosis not present

## 2024-06-17 DIAGNOSIS — N1832 Chronic kidney disease, stage 3b: Secondary | ICD-10-CM

## 2024-06-17 DIAGNOSIS — I25810 Atherosclerosis of coronary artery bypass graft(s) without angina pectoris: Secondary | ICD-10-CM

## 2024-06-17 DIAGNOSIS — E1151 Type 2 diabetes mellitus with diabetic peripheral angiopathy without gangrene: Secondary | ICD-10-CM

## 2024-06-17 DIAGNOSIS — I5042 Chronic combined systolic (congestive) and diastolic (congestive) heart failure: Secondary | ICD-10-CM | POA: Diagnosis not present

## 2024-06-17 DIAGNOSIS — E785 Hyperlipidemia, unspecified: Secondary | ICD-10-CM | POA: Diagnosis not present

## 2024-06-17 DIAGNOSIS — I25118 Atherosclerotic heart disease of native coronary artery with other forms of angina pectoris: Secondary | ICD-10-CM | POA: Diagnosis not present

## 2024-06-17 DIAGNOSIS — N184 Chronic kidney disease, stage 4 (severe): Secondary | ICD-10-CM

## 2024-06-17 DIAGNOSIS — G6289 Other specified polyneuropathies: Secondary | ICD-10-CM | POA: Diagnosis not present

## 2024-06-17 LAB — CBC WITH DIFFERENTIAL/PLATELET
Basophils Absolute: 0 K/uL (ref 0.0–0.1)
Basophils Relative: 0.4 % (ref 0.0–3.0)
Eosinophils Absolute: 0.2 K/uL (ref 0.0–0.7)
Eosinophils Relative: 2 % (ref 0.0–5.0)
HCT: 47.5 % (ref 39.0–52.0)
Hemoglobin: 15.5 g/dL (ref 13.0–17.0)
Lymphocytes Relative: 19.9 % (ref 12.0–46.0)
Lymphs Abs: 1.8 K/uL (ref 0.7–4.0)
MCHC: 32.5 g/dL (ref 30.0–36.0)
MCV: 91.2 fl (ref 78.0–100.0)
Monocytes Absolute: 0.7 K/uL (ref 0.1–1.0)
Monocytes Relative: 7.5 % (ref 3.0–12.0)
Neutro Abs: 6.5 K/uL (ref 1.4–7.7)
Neutrophils Relative %: 70.2 % (ref 43.0–77.0)
Platelets: 189 K/uL (ref 150.0–400.0)
RBC: 5.21 Mil/uL (ref 4.22–5.81)
RDW: 17.2 % — ABNORMAL HIGH (ref 11.5–15.5)
WBC: 9.2 K/uL (ref 4.0–10.5)

## 2024-06-17 LAB — LIPID PANEL
Cholesterol: 158 mg/dL (ref 28–200)
HDL: 43 mg/dL (ref >39.00)
LDL Cholesterol: 80 mg/dL (ref 10–99)
NonHDL: 115.38
Total CHOL/HDL Ratio: 4
Triglycerides: 175 mg/dL — ABNORMAL HIGH (ref 10.0–149.0)
VLDL: 35 mg/dL (ref 0.0–40.0)

## 2024-06-17 LAB — COMPREHENSIVE METABOLIC PANEL WITH GFR
ALT: 59 U/L — ABNORMAL HIGH (ref 3–53)
AST: 31 U/L (ref 5–37)
Albumin: 4.7 g/dL (ref 3.5–5.2)
Alkaline Phosphatase: 92 U/L (ref 39–117)
BUN: 46 mg/dL — ABNORMAL HIGH (ref 6–23)
CO2: 31 meq/L (ref 19–32)
Calcium: 10 mg/dL (ref 8.4–10.5)
Chloride: 100 meq/L (ref 96–112)
Creatinine, Ser: 1.56 mg/dL — ABNORMAL HIGH (ref 0.40–1.50)
GFR: 40.14 mL/min — ABNORMAL LOW (ref >60.00)
Glucose, Bld: 148 mg/dL — ABNORMAL HIGH (ref 70–99)
Potassium: 4.7 meq/L (ref 3.5–5.1)
Sodium: 142 meq/L (ref 135–145)
Total Bilirubin: 0.6 mg/dL (ref 0.2–1.2)
Total Protein: 7.2 g/dL (ref 6.0–8.3)

## 2024-06-17 LAB — MICROALBUMIN / CREATININE URINE RATIO
Creatinine,U: 17 mg/dL
Microalb Creat Ratio: UNDETERMINED mg/g (ref 0.0–30.0)
Microalb, Ur: <0.7 mg/dL

## 2024-06-17 LAB — HEMOGLOBIN A1C: Hgb A1c MFr Bld: 7.8 % — ABNORMAL HIGH (ref 4.6–6.5)

## 2024-06-17 MED ORDER — PREGABALIN 150 MG PO CAPS: ORAL_CAPSULE | ORAL | 1 refills | Status: AC

## 2024-06-17 NOTE — Assessment & Plan Note (Signed)
Check labs today F/u nephrology 

## 2024-06-17 NOTE — Assessment & Plan Note (Signed)
 Check labs  Pt no on mounjaro --- this helping his glucose

## 2024-06-17 NOTE — Assessment & Plan Note (Signed)
 Per cardiology

## 2024-06-17 NOTE — Progress Notes (Signed)
 Subjective:    Patient ID: Richard [name], male    DOB: 05-24-1941, 83 y.o.   MRN: [id number]  Chief Complaint  Patient presents with   Neuropathy   Follow-up    HPI Patient is in today for f/u and c/o neuropathy.  Discussed the use of AI scribe software for clinical note transcription with the patient, who gave verbal consent to proceed.  History of Present Illness Richard [name] is an 83 year old male who presents with burning and sharp pain in his feet.  He experiences burning and sharp pain in his feet, particularly when sitting or lying down. The pain is described as shooting and is relieved by removing shoes and socks. His feet become 'ice cold' when reclining. The symptoms are present during activities such as playing golf three times a week. He is currently taking pregabalin  150 mg twice daily.  He has been on Mounjaro  for the past month, which has helped reduce his fasting blood sugar from 170-180 mg/dL to an average of 849 mg/dL. He has lost 11 pounds since starting the medication. He experienced a hypoglycemic episode with a blood sugar of 96 mg/dL after not eating for seven hours, which he managed by drinking Gatorade. His fasting blood sugar returned to 151 mg/dL the following morning.  He was informed by his pacemaker clinic that he was in atrial fibrillation, although he experienced no symptoms. After discontinuing Mounjaro  for a week, he was re-evaluated and found not to be in atrial fibrillation. He has since resumed Mounjaro .  He has elevated triglycerides, currently at 800 mg/dL. He reduced his fenofibrate  dose from 150 mg to 54 mg. He is also mindful of his diet, avoiding snacks and ice cream.  He mentions having regular blood work done at the cancer center, with a recent blood sugar reading of 164 mg/dL while off Mounjaro . He has received both his flu and COVID vaccinations.    Past Medical History:  Diagnosis Date   Actinic keratosis 03/29/2024   R upper arm,  EDC   Anemia    Arthritis    hips   Axillary adenopathy 02/25/2017   Bradycardia    a. holter monitor has demonstrated HRs in 30s and Weinkibach    CAD (coronary artery disease)    a. s/p CABG 2001  b.  07/28/2017 cath:   Severe three-vessel native CAD with total occlusion of LAD, ramus intermedius, first OM and RCA, patent RIMA to PDA, LIMA to LAD, sequential SVG to ramus intermedius and first OM.     Chronic lower back pain    Diffuse large B cell lymphoma (HCC)    Diverticulosis    Esophageal stricture    GERD (gastroesophageal reflux disease)    History of gout    HTN (hypertension)    Mixed hyperlipidemia    OSA on CPAP    with 2L O2 at night   Pancytopenia (HCC)    a. related to chemo therapy for B cell lymphoma   Peptic stricture of esophagus    Presence of permanent cardiac pacemaker    sees Dr. Maya pacemaker   SCC (squamous cell carcinoma) 01/31/2021   R zygoma, EDC   SCC (squamous cell carcinoma) 01/31/2021   L post ankle, EDC   SCC (squamous cell carcinoma) 01/31/2021   L popliteal, EDC   Severe aortic stenosis    Spinal stenosis    Squamous cell carcinoma in situ 03/29/2024   R ant thigh, EDC   Squamous cell  carcinoma of skin 03/22/2009   Right mandible. SCCis, hypertrophic.    Squamous cell carcinoma of skin 12/25/2021   R lateral cheek, EDC   Squamous cell carcinoma of skin 12/25/2021   R postauricular neck, EDC   Squamous cell carcinoma of skin 12/25/2021   R forearm sup, EDC   Squamous cell carcinoma of skin 12/25/2021   R wrist, EDC   Squamous cell carcinoma of skin 08/27/2023   Left thenar hand dorsum, EDC   Squamous cell carcinoma of skin 12/23/2023   Right Mid Forearm Thenar, EDC   Squamous cell carcinoma of skin 12/23/2023   Right Thenar Hand - Dorsum, EDC   Squamous cell carcinoma of skin 12/23/2023   Right Upper pretibial, EDC   Squamous cell carcinoma of skin 03/29/2024   L preauricular, EDC   Type II diabetes mellitus (HCC)     Wears dentures    partial upper    Past Surgical History:  Procedure Laterality Date   APPENDECTOMY  ~ 1952   BACK SURGERY     CATARACT EXTRACTION W/PHACO Left 11/06/2016   Procedure: CATARACT EXTRACTION PHACO AND INTRAOCULAR LENS PLACEMENT (IOC);  Surgeon: Dingeldein, Steven, MD;  Location: [facility] ORS;  Service: Ophthalmology;  Laterality: Left;  Lot # [id number] H US : 01:09.4 AP%:25.2 CDE: 30.64   CATARACT EXTRACTION W/PHACO Right 12/04/2016   Procedure: CATARACT EXTRACTION PHACO AND INTRAOCULAR LENS PLACEMENT (IOC);  Surgeon: Dingeldein, Steven, MD;  Location: [facility] ORS;  Service: Ophthalmology;  Laterality: Right;  US  1:25.9 AP% 24.1 CDE 39.10 Fluid Pack lot # [id number]   COLONOSCOPY W/ BIOPSIES AND POLYPECTOMY  07/02/2011   CORONARY ANGIOPLASTY  07/02/1991   CORONARY ANGIOPLASTY WITH STENT PLACEMENT  05/31/1997   1   CORONARY ARTERY BYPASS GRAFT  03/01/2000   CABG X5   ECTROPION REPAIR Right 09/01/2018   Procedure: REPAIR OF ECTROPION BILATERAL upper and lower;  Surgeon: Ashley Greig HERO, MD;  Location: [facility];  Service: Ophthalmology;  Laterality: Right;  Diabetic - oral meds sleep apnea   ESOPHAGEAL DILATION  X 3-4   Dr. Sheppard Finn; last one was in the 1990's   ESOPHAGOGASTRODUODENOSCOPY     multiple   FLEXIBLE SIGMOIDOSCOPY     multiple   HYDRADENITIS EXCISION Left 02/25/2017   Procedure: EXCISION DEEP LEFT AXILLARY LYMPH NODE;  Surgeon: Gail Favorite, MD;  Location: [facility] OR;  Service: General;  Laterality: Left;   INTRAOPERATIVE TRANSTHORACIC ECHOCARDIOGRAM N/A 09/09/2017   Procedure: INTRAOPERATIVE TRANSTHORACIC ECHOCARDIOGRAM;  Surgeon: Verlin Lonni BIRCH, MD;  Location: [facility] OR;  Service: Open Heart Surgery;  Laterality: N/A;   KNEE ARTHROSCOPY Left 07/01/2009   meniscus repair   LEFT HEART CATHETERIZATION WITH CORONARY/GRAFT ANGIOGRAM N/A 03/16/2014   Procedure: LEFT HEART CATHETERIZATION WITH EL BILE;  Surgeon: Lonni BIRCH Verlin,  MD;  Location: [facility] CATH LAB;  Service: Cardiovascular;  Laterality: N/A;   LUMBAR LAMINECTOMY/DECOMPRESSION MICRODISCECTOMY Right 06/17/2013   Procedure: LUMBAR LAMINECTOMY MICRODISCECTOMY L4-L5 RIGHT EXCISION OF SYNOVIAL CYST RIGHT   (1 LEVEL) RIGHT PARTIAL FACETECTOMY;  Surgeon: Tanda DELENA Heading, MD;  Location: [facility] ORS;  Service: Orthopedics;  Laterality: Right;   MYELOGRAM  04/06/2013   lumbar, Dr Heading   ORBITAL LESION EXCISION Right 09/01/2018   Procedure: ORBITOTOMY WITHOUT BONE FLAP WITH REMOVAL OF LESION RIGHT;  Surgeon: Ashley Greig HERO, MD;  Location: [facility];  Service: Ophthalmology;  Laterality: Right;   PACEMAKER IMPLANT N/A 09/10/2017   Procedure: PACEMAKER IMPLANT;  Surgeon: Fernande Elspeth BROCKS, MD;  Location: [facility] INVASIVE CV LAB;  Service: Cardiovascular;  Laterality: N/A;   PANENDOSCOPY     PERIPHERAL VASCULAR BALLOON ANGIOPLASTY Left 03/16/2021   Procedure: PERIPHERAL VASCULAR BALLOON ANGIOPLASTY;  Surgeon: Magda Debby SAILOR, MD;  Location: [facility] INVASIVE CV LAB;  Service: Cardiovascular;  Laterality: Left;  subclavian  vein   PORTACATH PLACEMENT N/A 03/06/2017   Procedure: INSERTION PORT-A-CATH AND ASPIRATE SEROMA LEFT AXILLA;  Surgeon: Gail Favorite, MD;  Location: [facility] OR;  Service: General;  Laterality: N/A;   RIGHT/LEFT HEART CATH AND CORONARY/GRAFT ANGIOGRAPHY N/A 07/28/2017   Procedure: RIGHT/LEFT HEART CATH AND CORONARY/GRAFT ANGIOGRAPHY;  Surgeon: Wonda Sharper, MD;  Location: [facility] INVASIVE CV LAB;  Service: Cardiovascular;  Laterality: N/A;   SHOULDER SURGERY Right 08/30/2010   screws placed; tendons tore off   SKIN CANCER EXCISION Right    neck   TONSILLECTOMY  ~ 1954   TOTAL KNEE ARTHROPLASTY Left 06/29/2019   Procedure: TOTAL KNEE ARTHROPLASTY;  Surgeon: Ernie Cough, MD;  Location: [facility] ORS;  Service: Orthopedics;  Laterality: Left;  70 mins   TRANSCATHETER AORTIC VALVE REPLACEMENT, TRANSFEMORAL N/A 09/09/2017   Procedure: TRANSCATHETER AORTIC VALVE  REPLACEMENT, TRANSFEMORAL;  Surgeon: Verlin Lonni BIRCH, MD;  Location: [facility] OR;  Service: Open Heart Surgery;  Laterality: N/A;   UPPER EXTREMITY VENOGRAPHY Left 02/15/2021   Procedure: CENTRAL VENO;  Surgeon: Magda Debby SAILOR, MD;  Location: [facility] INVASIVE CV LAB;  Service: Cardiovascular;  Laterality: Left;   UPPER EXTREMITY VENOGRAPHY N/A 03/16/2021   Procedure: UPPER EXTREMITY VENOGRAPHY;  Surgeon: Magda Debby SAILOR, MD;  Location: [facility] INVASIVE CV LAB;  Service: Cardiovascular;  Laterality: N/A;   UPPER GI ENDOSCOPY  07/02/2011   Gastritis; Dr Avram   VASECTOMY     wireless pacemaker placed      Family History  Problem Relation Age of Onset   Stroke Father    Hypertension Father    Pancreatic cancer Mother    Diabetes Maternal Grandmother    Stroke Maternal Grandmother    Heart attack Paternal Grandmother    Colon cancer Neg Hx    Esophageal cancer Neg Hx    Rectal cancer Neg Hx    Stomach cancer Neg Hx    Ulcers Neg Hx     Social History   Socioeconomic History   Marital status: Divorced    Spouse name: Not on file   Number of children: 2   Years of education: college   Highest education level: Not on file  Occupational History    Employer: RETIRED  Tobacco Use   Smoking status: Never    Passive exposure: Never   Smokeless tobacco: Never  Vaping Use   Vaping status: Never Used  Substance and Sexual Activity   Alcohol use: No    Alcohol/week: 0.0 standard drinks of alcohol    Comment: last drink was in 2012( 03/15/2014)   Drug use: No   Sexual activity: Not Currently  Other Topics Concern   Not on file  Social History Narrative   ** Merged History Encounter **       Divorced, lives with a roommate. 1 son one daughter 3-4 caffeinated beverages daily Right-handed. 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   Social Drivers of Health    Tobacco Use: Low Risk (06/17/2024)   Patient History    Smoking Tobacco Use: Never    Smokeless Tobacco Use: Never    Passive Exposure: Never  Financial Resource Strain: Low Risk (09/30/2023)   Overall Financial Resource Strain (CARDIA)    Difficulty of Paying Living Expenses: Not hard at all  Food Insecurity: Low Risk (04/06/2024)   Received from [facility]   Epic    Within the past 12 months, you worried that your food would run out before you got money to buy more: Never true    Within the past 12 months, the food you bought just didn't last and you didn't have money to get more. : Never true  Transportation Needs: No Transportation Needs (04/06/2024)   Received from [facility]    In the past 12 months, has lack of reliable transportation kept you from medical appointments, meetings, work or from getting things needed for daily living? : No  Physical Activity: Sufficiently Active (09/30/2023)   Exercise Vital Sign    Days of Exercise per Week: 3 days    Minutes of Exercise per Session: 150+ min  Stress: No Stress Concern Present (09/30/2023)   [employer] of Occupational Health - Occupational Stress Questionnaire    Feeling of Stress : Not at all  Social Connections: Moderately Integrated (09/30/2023)   Social Connection and Isolation Panel    Frequency of Communication with Friends and Family: More than three times a week    Frequency of Social Gatherings with Friends and Family: More than three times a week    Attends Religious Services: More than 4 times per year    Active Member of [employer] or Organizations: Yes    Attends Banker Meetings: More than 4 times per year    Marital Status: Divorced  Intimate Partner Violence: Not At Risk (09/30/2023)   Humiliation, Afraid, Rape, and Kick questionnaire    Fear of Current or Ex-Partner: No    Emotionally Abused: No    Physically Abused: No    Sexually Abused: No  Depression (PHQ2-9): Low Risk  (06/15/2024)   Depression (PHQ2-9)    PHQ-2 Score: 0  Alcohol Screen: Low Risk (09/17/2022)   Alcohol Screen    Last Alcohol Screening Score (AUDIT): 0  Housing: Low Risk (04/06/2024)   Received from [facility]   Epic    What is your living situation today?: I have a steady place to live    Think about the place you live. Do you have problems with any of the following? Choose all that apply:: None/None on this list  Utilities: Low Risk (04/06/2024)   Received from [facility]   Utilities    In the past 12 months has the electric, gas, oil, or water company threatened to shut off services in your home? : No  Health Literacy: Adequate Health Literacy (09/30/2023)   B1300 Health Literacy    Frequency of need for help with medical instructions: Never    Outpatient Medications Prior to Visit  Medication Sig Dispense Refill   acetaminophen  (TYLENOL ) 325 MG tablet Take 650 mg by mouth at bedtime as needed for moderate pain (pain score 4-6) or headache.     allopurinol  (ZYLOPRIM ) 300 MG tablet Take 450 mg by mouth daily.     aluminum hydroxide-magnesium  carbonate (GAVISCON) 95-358 MG/15ML SUSP Take 15 mLs by mouth as needed for indigestion or heartburn.     amoxicillin  (AMOXIL ) 500 MG capsule Take 500 mg by mouth 2 (two) times daily.     Ascorbic Acid  (VITAMIN C  PO) Take 1 tablet by mouth daily with breakfast.     atorvastatin  (LIPITOR ) 80 MG  tablet TAKE 1 TABLET BY MOUTH EVERYDAY AT BEDTIME 90 tablet 3   Azelastine  HCl 137 MCG/SPRAY SOLN PLACE 2 SPRAYS INTO BOTH NOSTRILS AT BEDTIME AS NEEDED FOR RHINITIS OR ALLERGIES. 90 mL 1   Coenzyme Q10 (CO Q-10 PO) Take 100 mg by mouth daily.     ELIQUIS  2.5 MG TABS tablet TAKE 1 TABLET BY MOUTH TWICE A DAY 180 tablet 1   esomeprazole  (NEXIUM ) 40 MG capsule Take 1 capsule (40 mg total) by mouth daily. 30 capsule 5   ezetimibe  (ZETIA ) 10 MG tablet Take 1 tablet (10 mg total) by mouth daily. Pt needs office visit for further refills 90 tablet 1   FARXIGA   10 MG TABS tablet Take 10 mg by mouth in the morning.     fenofibrate  54 MG tablet Take 1 tablet (54 mg total) by mouth daily. 90 tablet 2   folic acid  (FOLVITE ) 1 MG tablet TAKE 2 TABLETS BY MOUTH EVERY DAY 180 tablet 4   FREESTYLE LITE test strip USE TO TEST BLOOD SUGAR ONCE A DAY. DX CODE: E11.9 100 strip 12   glimepiride  (AMARYL ) 2 MG tablet TAKE 1 TABLET (2 MG TOTAL) BY MOUTH IN THE MORNING AND AT BEDTIME. 180 tablet 1   Lancets (FREESTYLE) lancets USE ONCE A DAY TO CHECK BLOOD SUGAR. 100 each 12   Menthol -Camphor (ICY HOT ADVANCED PAIN RELIEF EX) Apply 1 application  topically daily as needed (to painful sites).     metoprolol  succinate (TOPROL -XL) 25 MG 24 hr tablet TAKE 1 TABLET (25 MG TOTAL) BY MOUTH DAILY. 90 tablet 3   nitroGLYCERIN  (NITROSTAT ) 0.4 MG SL tablet PLACE 1 TABLET UNDER THE TONGUE EVERY 5 MINUTES AS NEEDED FOR CHEST PAIN. 25 tablet 6   potassium chloride  SA (KLOR-CON  M) 20 MEQ tablet TAKE 2 TABLETS BY MOUTH EVERY DAY 180 tablet 2   tirzepatide  (MOUNJARO ) 7.5 MG/0.5ML Pen Inject 7.5 mg into the skin once a week. 6 mL 0   torsemide  (DEMADEX ) 20 MG tablet TAKE 2 TAB BY MOUTH IN MORNING TAKE EXTRA TABLET FOR WEIGHT GAIN MORE THAN 2-3LBS IN 24HR FOR 90DAY 205 tablet 0   VASCEPA  1 g capsule TAKE 2 CAPSULES BY MOUTH 2 TIMES DAILY. (DAW9 VASCEPA ) 360 capsule 1   pregabalin  (LYRICA ) 150 MG capsule 1 po bid 180 capsule 1   psyllium (METAMUCIL) 58.6 % powder Take 1 packet by mouth daily as needed (for constipation- mix and drink). (Patient not taking: Reported on 06/17/2024)     Calcium  Polycarbophil (FIBER) 625 MG TABS Take 625 mg by mouth daily. (Patient not taking: Reported on 06/17/2024)     No facility-administered medications prior to visit.    Allergies  Allergen Reactions   Ace Inhibitors Swelling and Other (See Comments)    Angioedema    Benazepril Swelling and Other (See Comments)    Angioedema; he is not a candidate for any angiotensin receptor blockers because of  this significant allergic reaction. Because of a history of documented adverse serious drug reaction;Medi Alert bracelet  is recommended   Entresto  [Sacubitril -Valsartan ] Swelling and Other (See Comments)    First-in-Class Angiotensin Receptor Neprilysin Inhibitor- Med was red-flagged by the patient's pharmacy for him to NOT take!   Hctz [Hydrochlorothiazide] Anaphylaxis and Swelling    Tongue and lip swelling    Aspirin  Other (See Comments)    Gastritis, can aspirin  not take 325 mg aspirin     Review of Systems  Constitutional:  Negative for fever and malaise/fatigue.  HENT:  Negative  for congestion.   Eyes:  Negative for blurred vision.  Respiratory:  Negative for cough and shortness of breath.   Cardiovascular:  Negative for chest pain, palpitations and leg swelling.  Gastrointestinal:  Negative for vomiting.  Musculoskeletal:  Negative for back pain.  Skin:  Negative for rash.  Neurological:  Positive for tingling and sensory change. Negative for loss of consciousness and headaches.       Objective:    Physical Exam Vitals and nursing note reviewed.  Constitutional:      General: He is not in acute distress.    Appearance: Normal appearance. He is well-developed.  HENT:     Head: Normocephalic and atraumatic.  Eyes:     General: No scleral icterus.       Right eye: No discharge.        Left eye: No discharge.  Cardiovascular:     Rate and Rhythm: Normal rate and regular rhythm.     Heart sounds: No murmur heard. Pulmonary:     Effort: Pulmonary effort is normal. No respiratory distress.     Breath sounds: Normal breath sounds.  Musculoskeletal:        General: Normal range of motion.     Cervical back: Normal range of motion and neck supple.     Right lower leg: No edema.     Left lower leg: No edema.  Skin:    General: Skin is warm and dry.  Neurological:     Mental Status: He is alert and oriented to person, place, and time.  Psychiatric:        Mood and  Affect: Mood normal.        Behavior: Behavior normal.        Thought Content: Thought content normal.        Judgment: Judgment normal.     BP 120/78 (BP Location: Left Arm, Patient Position: Sitting, Cuff Size: Large)   Pulse 72   Temp 97.8 F (36.6 C) (Oral)   Resp 18   Ht 5' 11 (1.803 m)   Wt 224 lb 3.2 oz (101.7 kg)   SpO2 94%   BMI 31.27 kg/m  Wt Readings from Last 3 Encounters:  06/17/24 224 lb 3.2 oz (101.7 kg)  06/15/24 221 lb (100.2 kg)  06/07/24 217 lb (98.4 kg)    Diabetic Foot Exam - Simple   No data filed    Lab Results  Component Value Date   WBC 8.6 06/15/2024   HGB 15.0 06/15/2024   HCT 46.9 06/15/2024   PLT 158 06/15/2024   GLUCOSE 164 (H) 06/15/2024   CHOL 182 03/18/2024   TRIG (H) 03/18/2024    726.0 Triglyceride is over 400; calculations on Lipids are invalid.   HDL 32.00 (L) 03/18/2024   LDLDIRECT 52.0 03/18/2024   LDLCALC  12/19/2023     Comment:     . LDL cholesterol not calculated. Triglyceride levels greater than 400 mg/dL invalidate calculated LDL results. . Reference range: <100 . Desirable range <100 mg/dL for primary prevention;   <70 mg/dL for patients with CHD or diabetic patients  with > or = 2 CHD risk factors. SABRA LDL-C is now calculated using the Martin-Hopkins  calculation, which is a validated novel method providing  better accuracy than the Friedewald equation in the  estimation of LDL-C.  Gladis APPLETHWAITE et al. SANDREA. 7986;689(80): 2061-2068  (http://education.[facility].com/faq/FAQ164)    ALT 67 (H) 06/15/2024   AST 38 06/15/2024   NA 141 06/15/2024  K 4.7 06/15/2024   CL 101 06/15/2024   CREATININE 1.66 (H) 06/15/2024   BUN 53 (H) 06/15/2024   CO2 31 06/15/2024   TSH 2.450 07/27/2018   PSA 1.03 07/26/2021   INR 1.2 04/14/2023   HGBA1C 7.8 (H) 03/18/2024   MICROALBUR 0.9 03/18/2024    Lab Results  Component Value Date   TSH 2.450 07/27/2018   Lab Results  Component Value Date   WBC 8.6 06/15/2024    HGB 15.0 06/15/2024   HCT 46.9 06/15/2024   MCV 92.1 06/15/2024   PLT 158 06/15/2024   Lab Results  Component Value Date   NA 141 06/15/2024   K 4.7 06/15/2024   CO2 31 06/15/2024   GLUCOSE 164 (H) 06/15/2024   BUN 53 (H) 06/15/2024   CREATININE 1.66 (H) 06/15/2024   BILITOT 0.6 06/15/2024   ALKPHOS 100 06/15/2024   AST 38 06/15/2024   ALT 67 (H) 06/15/2024   PROT 7.0 06/15/2024   ALBUMIN  4.6 06/15/2024   CALCIUM  9.5 06/15/2024   ANIONGAP 9 06/15/2024   EGFR 32 (L) 12/19/2023   GFR 36.01 (L) 03/18/2024   Lab Results  Component Value Date   CHOL 182 03/18/2024   Lab Results  Component Value Date   HDL 32.00 (L) 03/18/2024   Lab Results  Component Value Date   [facility]  12/19/2023     Comment:     . LDL cholesterol not calculated. Triglyceride levels greater than 400 mg/dL invalidate calculated LDL results. . Reference range: <100 . Desirable range <100 mg/dL for primary prevention;   <70 mg/dL for patients with CHD or diabetic patients  with > or = 2 CHD risk factors. SABRA LDL-C is now calculated using the Martin-Hopkins  calculation, which is a validated novel method providing  better accuracy than the Friedewald equation in the  estimation of LDL-C.  Gladis APPLETHWAITE et al. SANDREA. 7986;689(80): 2061-2068  (http://education.[facility].com/faq/FAQ164)    Lab Results  Component Value Date   TRIG (H) 03/18/2024    726.0 Triglyceride is over 400; calculations on Lipids are invalid.   Lab Results  Component Value Date   CHOLHDL 6 03/18/2024   Lab Results  Component Value Date   HGBA1C 7.8 (H) 03/18/2024       Assessment & Plan:  Hyperlipidemia LDL goal <70  Type 2 diabetes mellitus with diabetic peripheral angiopathy without gangrene, without long-term current use of insulin  (HCC) -     Pregabalin ; 1 po tid  Dispense: 270 capsule; Refill: 1 -     Lipid panel -     CBC with Differential/Platelet -     Comprehensive metabolic panel with GFR -      Hemoglobin A1c -     Microalbumin / creatinine urine ratio  Atherosclerosis of coronary artery bypass graft of native heart without angina pectoris  Stage 3b chronic kidney disease (CKD) (HCC)  Chronic combined systolic and diastolic heart failure (HCC)  Coronary artery disease involving native coronary artery of native heart with other form of angina pectoris  Hyperlipidemia associated with type 2 diabetes mellitus (HCC) -     Lipid panel -     CBC with Differential/Platelet -     Comprehensive metabolic panel with GFR -     Hemoglobin A1c -     Microalbumin / creatinine urine ratio  Other polyneuropathy  Hypertriglyceridemia -     Lipid panel -     Comprehensive metabolic panel with GFR  Uncontrolled type 2 diabetes mellitus with  hyperglycemia (HCC) Assessment & Plan: Check labs  Pt no on mounjaro --- this helping his glucose   Status post transcatheter aortic valve replacement (TAVR) using bioprosthesis Assessment & Plan: Per cardiology   Stage 4 chronic kidney disease (HCC) Assessment & Plan: Check labs today F/u nephrology   Assessment and Plan Assessment & Plan Type 2 diabetes mellitus with diabetic peripheral neuropathy   He experiences chronic diabetic peripheral neuropathy with burning and sharp pain in the toes, worsened by sitting or lying down. Pregabalin  150 mg twice daily provides some relief. A re cose levels and aids weight loss. Increase pregabalin  to 150 mg three times daily. Monitor blood glucose levels, especially during fasting or medication adjustments. Hold glimepiride  if fasting is required for medical procedures. Consider referral to a specialist if symptoms do not improve with increased pregabalin . Amaryl  may have caused the hypoglycemic episode.   Hyperlipidemia and hypertriglyceridemia   Triglycerides were previously elevated at 800 mg/dL. Current treatment with fenofibrate , reduced to 54 mg daily per lipid clinic recommendation. Continue  fenofibrate  at 54 mg daily and maintain dietary modifications to support lipid management.  General health maintenance   He received flu and COVID vaccinations. Continue routine health maintenance and vaccinations as recommended.    Donelda Mailhot R Lowne Chase, DO

## 2024-06-19 LAB — FERRITIN: Ferritin: 1887 ng/mL — ABNORMAL HIGH (ref 24–336)

## 2024-06-19 LAB — VITAMIN B12: Vitamin B-12: 407 pg/mL (ref 180–914)

## 2024-06-21 ENCOUNTER — Ambulatory Visit: Payer: Self-pay | Admitting: Family Medicine

## 2024-06-21 NOTE — Addendum Note (Signed)
 Addended by: ANTONIO CYNDEE ROCKERS R on: 06/21/2024 04:33 PM   Modules accepted: Orders

## 2024-06-21 NOTE — Assessment & Plan Note (Signed)
 Refer to nephrology

## 2024-06-21 NOTE — Assessment & Plan Note (Signed)
 Per cardiology

## 2024-06-30 ENCOUNTER — Ambulatory Visit: Payer: Self-pay

## 2024-06-30 NOTE — Telephone Encounter (Signed)
"  Appt scheduled  "

## 2024-06-30 NOTE — Telephone Encounter (Signed)
 FYI Only or Action Required?: FYI only for provider: appointment scheduled on 07/02/2024.  Patient was last seen in primary care on 06/17/2024 by Antonio Meth, Jamee SAUNDERS, DO.  Called Nurse Triage reporting Sore Throat.  Symptoms began several days ago.  Interventions attempted: Rest, hydration, or home remedies.  Symptoms are: stable.  Triage Disposition: Home Care  Patient/caregiver understands and will follow disposition?: Yes  Copied from CRM #[id number]. Topic: Clinical - Red Word Triage >> Jun 30, 2024  9:41 AM Victoria A wrote: [facility] that prompted transfer to Nurse Triage: Patient said he has a severe sore throat- has been ongoing since Monday. Patient said he has had Strep in the past Reason for Disposition  [1] Sore throat with cough/cold symptoms AND [2] present < 5 days  Answer Assessment - Initial Assessment Questions States he is on amoxicillin  1000mg  daily the rest of his life due to a cardiac infection. Fatigue. Using nasal spray. 1. ONSET: When did the throat start hurting? (Hours or days ago)      Monday 4.  VIRAL SYMPTOMS: Are there any symptoms of a cold, such as a runny nose, cough, hoarse voice or red eyes?      Nasal congestion and runny nose 5. FEVER: Do you have a fever? If Yes, ask: What is your temperature, how was it measured, and when did it start?     Denies 6. PUS ON THE TONSILS: Is there pus on the tonsils in the back of your throat?     Denis 7. OTHER SYMPTOMS: Do you have any other symptoms? (e.g., difficulty breathing, headache, rash)     Yellow/green productive cough  Protocols used: Sore Throat-A-AH

## 2024-07-02 ENCOUNTER — Ambulatory Visit (INDEPENDENT_AMBULATORY_CARE_PROVIDER_SITE_OTHER): Admitting: Family Medicine

## 2024-07-02 ENCOUNTER — Encounter: Payer: Self-pay | Admitting: Family Medicine

## 2024-07-02 VITALS — BP 126/74 | HR 83 | Temp 97.7°F | Resp 16 | Ht 71.0 in | Wt 225.0 lb

## 2024-07-02 DIAGNOSIS — J40 Bronchitis, not specified as acute or chronic: Secondary | ICD-10-CM | POA: Diagnosis not present

## 2024-07-02 MED ORDER — PREDNISONE 20 MG PO TABS: 40.0000 mg | ORAL_TABLET | Freq: Every day | ORAL | 0 refills | Status: AC

## 2024-07-02 NOTE — Progress Notes (Signed)
 Chief Complaint  Patient presents with   Sore Throat    Sore Throat and Cough    Richard [name] here for URI complaints.  Duration: 4 days  Associated symptoms: sinus congestion, rhinorrhea, itchy watery eyes, sore throat, wheezing, and productive cough Denies: sinus pain, ear pain, ear drainage, shortness of breath, myalgia, and fevers Treatment to date: Mucinex  DM Sick contacts: No  Past Medical History:  Diagnosis Date   Actinic keratosis 03/29/2024   R upper arm, EDC   Anemia    Arthritis    hips   Axillary adenopathy 02/25/2017   Bradycardia    a. holter monitor has demonstrated HRs in 30s and Weinkibach    CAD (coronary artery disease)    a. s/p CABG 2001  b.  07/28/2017 cath:   Severe three-vessel native CAD with total occlusion of LAD, ramus intermedius, first OM and RCA, patent RIMA to PDA, LIMA to LAD, sequential SVG to ramus intermedius and first OM.     Chronic lower back pain    Diffuse large B cell lymphoma (HCC)    Diverticulosis    Esophageal stricture    GERD (gastroesophageal reflux disease)    History of gout    HTN (hypertension)    Mixed hyperlipidemia    OSA on CPAP    with 2L O2 at night   Pancytopenia (HCC)    a. related to chemo therapy for B cell lymphoma   Peptic stricture of esophagus    Presence of permanent cardiac pacemaker    sees Dr. Maya pacemaker   SCC (squamous cell carcinoma) 01/31/2021   R zygoma, EDC   SCC (squamous cell carcinoma) 01/31/2021   L post ankle, EDC   SCC (squamous cell carcinoma) 01/31/2021   L popliteal, EDC   Severe aortic stenosis    Spinal stenosis    Squamous cell carcinoma in situ 03/29/2024   R ant thigh, EDC   Squamous cell carcinoma of skin 03/22/2009   Right mandible. SCCis, hypertrophic.    Squamous cell carcinoma of skin 12/25/2021   R lateral cheek, EDC   Squamous cell carcinoma of skin 12/25/2021   R postauricular neck, EDC   Squamous cell carcinoma of skin 12/25/2021   R forearm  sup, EDC   Squamous cell carcinoma of skin 12/25/2021   R wrist, EDC   Squamous cell carcinoma of skin 08/27/2023   Left thenar hand dorsum, EDC   Squamous cell carcinoma of skin 12/23/2023   Right Mid Forearm Thenar, EDC   Squamous cell carcinoma of skin 12/23/2023   Right Thenar Hand - Dorsum, EDC   Squamous cell carcinoma of skin 12/23/2023   Right Upper pretibial, EDC   Squamous cell carcinoma of skin 03/29/2024   L preauricular, EDC   Type II diabetes mellitus (HCC)    Wears dentures    partial upper    Objective BP 126/74 (BP Location: Left Arm, Patient Position: Sitting)   Pulse 83   Temp 97.7 F (36.5 C) (Oral)   Resp 16   Ht 5' 11 (1.803 m)   Wt 225 lb (102.1 kg)   SpO2 96%   BMI 31.38 kg/m  General: Awake, alert, appears stated age HEENT: AT, [place], ears patent b/l and TM's neg, nares patent w/o discharge, pharynx pink and without exudates, MMM, + maxillary sinus ttp bilaterally Neck: Submandibular glands are swollen and TTP bilaterally, no obvious cervical lymphadenopathy Heart: RRR Lungs: Diffuse wheezes noted, no accessory muscle use Psych: Age appropriate judgment  and insight, normal mood and affect  Wheezy bronchitis  5-day prednisone  burst 40 mg daily.  He reports he has tolerated this well in the past.  Sugars have actually been running low.  Send message in a few days if not obviously improving.  Continue to push fluids, practice good hand hygiene, cover mouth when coughing. F/u prn. If starting to experience fevers, shaking, or worsening shortness of breath, seek immediate care. Pt voiced understanding and agreement to the plan.  Mabel Mt [place], DO 07/02/2024 10:16 AM

## 2024-07-02 NOTE — Patient Instructions (Signed)
 Continue to push fluids, practice good hand hygiene, and cover your mouth if you cough.  If you start having fevers, shaking or shortness of breath, seek immediate care.  OK to take Tylenol  1000 mg (2 extra strength tabs) or 975 mg (3 regular strength tabs) every 6 hours as needed.  Send me a message Monday if not obviously improving.  Let us  know if you need anything.

## 2024-07-06 ENCOUNTER — Ambulatory Visit: Admitting: Dermatology

## 2024-07-06 ENCOUNTER — Ambulatory Visit: Payer: Self-pay

## 2024-07-06 ENCOUNTER — Other Ambulatory Visit: Payer: Self-pay | Admitting: Family Medicine

## 2024-07-06 MED ORDER — AZITHROMYCIN 250 MG PO TABS: ORAL_TABLET | ORAL | 0 refills | Status: DC

## 2024-07-06 NOTE — Telephone Encounter (Signed)
 FYI Only or Action Required?: Action required by provider: clinical question for provider and update on patient condition.  Patient was last seen in primary care on 07/02/2024 by Frann Mabel Mt, DO.  Called Nurse Triage reporting URI.  Symptoms began a week ago.  Interventions attempted: OTC medications: Mucinex  and Prescription medications: prednisone .  Symptoms are: slightly improved, but mostly unchanged.  Triage Disposition: Call PCP Now (overriding See HCP Within 4 Hours (Or PCP Triage))  Patient/caregiver understands and will follow disposition?: Yes                                  1. ONSET: When did the cough begin?      Treated in office on 07/02/24 3. SPUTUM: Describe the color of your sputum (e.g., none, dry cough; clear, white, yellow, green)     Yellow, lighter than when he was evaluated in office 4. HEMOPTYSIS: Are you coughing up any blood? If Yes, ask: How much? (e.g., flecks, streaks, tablespoons, etc.)     Denies 5. DIFFICULTY BREATHING: Are you having difficulty breathing? If Yes, ask: How bad is it? (e.g., mild, moderate, severe)      Denies, difficulty breathing occurs only during coughing spells 6. FEVER: Do you have a fever? If Yes, ask: What is your temperature, how was it measured, and when did it start?     Denies 10. OTHER SYMPTOMS: Do you have any other symptoms? (e.g., runny nose, wheezing, chest pain)     Head congestion, yellow nasal mucous, wheezing has slightly improved Denies chest pain     Patient was evaluated in office for symptoms on 07/02/24. Patient has completed prednisone  and cough and wheezing are still present, although slightly improved. Patient stated he is now experiencing more head and nasal congestion. Patient stated he was advised to call back if symptoms do not improve. Per OV note, provider advised, send message in a few days if not obviously improving. Please advise. Patient  would like a call back.  Reason for Disposition  Wheezing is present  Protocols used: Cough - Acute Productive-A-AH  Copied from CRM #[id number]. Topic: Clinical - Red Word Triage >> Jul 06, 2024 12:13 PM Eva FALCON wrote: Red Word that prompted transfer to Nurse Triage: is getting worse, coughing up yellow mucus, is also wheezing. states Dr. Frann advise if he finished steroids and he is not better to call back.

## 2024-07-07 ENCOUNTER — Ambulatory Visit: Payer: Self-pay

## 2024-07-07 NOTE — Telephone Encounter (Signed)
 E2C2 nurse notified pt.

## 2024-07-07 NOTE — Telephone Encounter (Signed)
 Patient triaged yesterday, view other encounter. Patient calling back in today because he has not heard back, advised Dr. Antonio sent a Z-pak last night to [facility]. Patient stated he will go pick it up, but wants to know if he can take Mucinex  DM and the abx together. Advised patient to ask pharmacist, but I will also ask PCP for recommendation. Advised patient to call us  back if sx worsen or dont change so we can get him an appointment. Please let patient know if he can take Mucinex  DM still. Thank you.  Copied from CRM #[id number]. Topic: Clinical - Red Word Triage >> Jul 07, 2024  9:05 AM Winona R wrote: Pt was triaged yesterday, Wheezing, colored mucus coming out of his nose. Pt was waiting on call back from provider as he was seen on 01/02 for Bronchitis and was prescribed Prednisone . As per the response from the Provider she sent in Z pak for the pt yesterday. Please provide the pt with instructions moving forward.

## 2024-07-07 NOTE — Telephone Encounter (Signed)
 Patient triaged yesterday, view other encounter. Patient calling back in today because he has not heard back, advised Dr. Antonio sent a Z-pak last night to [facility]. Patient stated he will go pick it up, but wants to know if he can take Mucinex  DM and the abx together. Advised patient to ask pharmacist, but I will also ask PCP for recommendation. Advised patient to call us  back if sx worsen or dont change so we can get him an appointment. Please let patient know if he can take Mucinex  DM still. Thank you.

## 2024-07-08 ENCOUNTER — Ambulatory Visit (INDEPENDENT_AMBULATORY_CARE_PROVIDER_SITE_OTHER)

## 2024-07-08 ENCOUNTER — Ambulatory Visit
Admission: EM | Admit: 2024-07-08 | Discharge: 2024-07-08 | Disposition: A | Discharge status: Home / Self Care | Attending: Family Medicine | Admitting: Family Medicine

## 2024-07-08 ENCOUNTER — Ambulatory Visit: Payer: Self-pay

## 2024-07-08 DIAGNOSIS — J069 Acute upper respiratory infection, unspecified: Secondary | ICD-10-CM

## 2024-07-08 DIAGNOSIS — R52 Pain, unspecified: Secondary | ICD-10-CM

## 2024-07-08 DIAGNOSIS — R062 Wheezing: Secondary | ICD-10-CM

## 2024-07-08 MED ORDER — DOXYCYCLINE HYCLATE 100 MG PO CAPS: 100.0000 mg | ORAL_CAPSULE | Freq: Two times a day (BID) | ORAL | 0 refills | Status: DC

## 2024-07-08 NOTE — Telephone Encounter (Signed)
FYI. Pt going to [facility].

## 2024-07-08 NOTE — ED Provider Notes (Addendum)
 " [facility]  Note:  This document was prepared using Conservation officer, historic buildings and may include unintentional dictation errors.  MRN: [id number] DOB: 1940/08/13  Subjective:   Richard [name] is a 84 y.o. male presenting for 2-week history of persistent malaise, coughing, wheezing, throat pain, drainage.  Patient has undergone a prednisone  course without relief of his symptoms as prescribed from his primary care provider.  He subsequently reached out to them again and was prescribed azithromycin .  Still feels unwell after 2 days of the antibiotic.  Has a history of congestive heart failure with an EF of 20% to 25% last seen on 04/16/2023.  Has previously been hospitalized for this.  No history of asthma.  No smoking of any kind including cigarettes, cigars, vaping, marijuana use.  Takes 1000 mg of amoxicillin  daily long-term.  Current Outpatient Medications  Medication Instructions   acetaminophen  (TYLENOL ) 650 mg, At bedtime PRN   allopurinol  (ZYLOPRIM ) 450 mg, Daily   aluminum hydroxide-magnesium  carbonate (GAVISCON) 95-358 MG/15ML SUSP 15 mLs, As needed   amoxicillin  (AMOXIL ) 500 mg, 2 times daily   Ascorbic Acid  (VITAMIN C  PO) 1 tablet, Daily with breakfast   atorvastatin  (LIPITOR ) 80 MG tablet TAKE 1 TABLET BY MOUTH EVERYDAY AT BEDTIME   Azelastine  HCl 137 MCG/SPRAY SOLN PLACE 2 SPRAYS INTO BOTH NOSTRILS AT BEDTIME AS NEEDED FOR RHINITIS OR ALLERGIES.   azithromycin  (ZITHROMAX  Z-PAK) 250 MG tablet As directed   Coenzyme Q10 (CO Q-10 PO) 100 mg, Daily   Eliquis  2.5 mg, Oral, 2 times daily   esomeprazole  (NEXIUM ) 40 mg, Oral, Daily   ezetimibe  (ZETIA ) 10 mg, Oral, Daily, Pt needs office visit for further refills   Farxiga  10 mg, Every morning   fenofibrate  54 mg, Oral, Daily   folic acid  (FOLVITE ) 2 mg, Oral, Daily   FREESTYLE LITE test strip USE TO TEST BLOOD SUGAR ONCE A DAY. DX CODE: E11.9   glimepiride  (AMARYL ) 2 mg, Oral, 2 times daily   Lancets  (FREESTYLE) lancets USE ONCE A DAY TO CHECK BLOOD SUGAR.   Menthol -Camphor (ICY HOT ADVANCED PAIN RELIEF EX) 1 application , Daily PRN   metoprolol  succinate (TOPROL -XL) 25 mg, Oral, Daily   nitroGLYCERIN  (NITROSTAT ) 0.4 MG SL tablet PLACE 1 TABLET UNDER THE TONGUE EVERY 5 MINUTES AS NEEDED FOR CHEST PAIN.   potassium chloride  SA (KLOR-CON  M) 20 MEQ tablet 40 mEq, Oral, Daily   pregabalin  (LYRICA ) 150 MG capsule 1 po tid   tirzepatide  (MOUNJARO ) 7.5 mg, Subcutaneous, Weekly   torsemide  (DEMADEX ) 20 MG tablet TAKE 2 TAB BY MOUTH IN MORNING TAKE EXTRA TABLET FOR WEIGHT GAIN MORE THAN 2-3LBS IN 24HR FOR 90DAY   VASCEPA  1 g capsule TAKE 2 CAPSULES BY MOUTH 2 TIMES DAILY. (DAW9 VASCEPA )    Allergies[1]  Past Medical History:  Diagnosis Date   Actinic keratosis 03/29/2024   R upper arm, EDC   Anemia    Arthritis    hips   Axillary adenopathy 02/25/2017   Bradycardia    a. holter monitor has demonstrated HRs in 30s and Weinkibach    CAD (coronary artery disease)    a. s/p CABG 2001  b.  07/28/2017 cath:   Severe three-vessel native CAD with total occlusion of LAD, ramus intermedius, first OM and RCA, patent RIMA to PDA, LIMA to LAD, sequential SVG to ramus intermedius and first OM.     Chronic lower back pain    Diffuse large B cell lymphoma (HCC)  Diverticulosis    Esophageal stricture    GERD (gastroesophageal reflux disease)    History of gout    HTN (hypertension)    Mixed hyperlipidemia    OSA on CPAP    with 2L O2 at night   Pancytopenia (HCC)    a. related to chemo therapy for B cell lymphoma   Peptic stricture of esophagus    Presence of permanent cardiac pacemaker    sees Dr. Maya pacemaker   SCC (squamous cell carcinoma) 01/31/2021   R zygoma, EDC   SCC (squamous cell carcinoma) 01/31/2021   L post ankle, EDC   SCC (squamous cell carcinoma) 01/31/2021   L popliteal, EDC   Severe aortic stenosis    Spinal stenosis    Squamous cell carcinoma in situ  03/29/2024   R ant thigh, EDC   Squamous cell carcinoma of skin 03/22/2009   Right mandible. SCCis, hypertrophic.    Squamous cell carcinoma of skin 12/25/2021   R lateral cheek, EDC   Squamous cell carcinoma of skin 12/25/2021   R postauricular neck, EDC   Squamous cell carcinoma of skin 12/25/2021   R forearm sup, EDC   Squamous cell carcinoma of skin 12/25/2021   R wrist, EDC   Squamous cell carcinoma of skin 08/27/2023   Left thenar hand dorsum, EDC   Squamous cell carcinoma of skin 12/23/2023   Right Mid Forearm Thenar, EDC   Squamous cell carcinoma of skin 12/23/2023   Right Thenar Hand - Dorsum, EDC   Squamous cell carcinoma of skin 12/23/2023   Right Upper pretibial, EDC   Squamous cell carcinoma of skin 03/29/2024   L preauricular, EDC   Type II diabetes mellitus (HCC)    Wears dentures    partial upper     Past Surgical History:  Procedure Laterality Date   APPENDECTOMY  ~ 1952   BACK SURGERY     CATARACT EXTRACTION W/PHACO Left 11/06/2016   Procedure: CATARACT EXTRACTION PHACO AND INTRAOCULAR LENS PLACEMENT (IOC);  Surgeon: Dingeldein, Steven, MD;  Location: [facility] ORS;  Service: Ophthalmology;  Laterality: Left;  Lot # [id number] US : 01:09.4 AP%:25.2 CDE: 30.64   CATARACT EXTRACTION W/PHACO Right 12/04/2016   Procedure: CATARACT EXTRACTION PHACO AND INTRAOCULAR LENS PLACEMENT (IOC);  Surgeon: Dingeldein, Steven, MD;  Location: [facility] ORS;  Service: Ophthalmology;  Laterality: Right;  US  1:25.9 AP% 24.1 CDE 39.10 Fluid Pack lot # [id number]   COLONOSCOPY W/ BIOPSIES AND POLYPECTOMY  07/02/2011   CORONARY ANGIOPLASTY  07/02/1991   CORONARY ANGIOPLASTY WITH STENT PLACEMENT  05/31/1997   1   CORONARY ARTERY BYPASS GRAFT  03/01/2000   CABG X5   ECTROPION REPAIR Right 09/01/2018   Procedure: REPAIR OF ECTROPION BILATERAL upper and lower;  Surgeon: Ashley Greig HERO, MD;  Location: [facility];  Service: Ophthalmology;  Laterality: Right;  Diabetic - oral  meds sleep apnea   ESOPHAGEAL DILATION  X 3-4   Dr. Sheppard Finn; last one was in the 1990's   ESOPHAGOGASTRODUODENOSCOPY     multiple   FLEXIBLE SIGMOIDOSCOPY     multiple   HYDRADENITIS EXCISION Left 02/25/2017   Procedure: EXCISION DEEP LEFT AXILLARY LYMPH NODE;  Surgeon: Gail Favorite, MD;  Location: [facility] OR;  Service: General;  Laterality: Left;   INTRAOPERATIVE TRANSTHORACIC ECHOCARDIOGRAM N/A 09/09/2017   Procedure: INTRAOPERATIVE TRANSTHORACIC ECHOCARDIOGRAM;  Surgeon: Verlin Lonni BIRCH, MD;  Location[facility];  Service: Open Heart Surgery;  Laterality: N/A;   KNEE ARTHROSCOPY Left 07/01/2009   meniscus repair  LEFT HEART CATHETERIZATION WITH CORONARY/GRAFT ANGIOGRAM N/A 03/16/2014   Procedure: LEFT HEART CATHETERIZATION WITH EL BILE;  Surgeon: Lonni JONETTA Cash, MD;  Location: [facility] CATH LAB;  Service: Cardiovascular;  Laterality: N/A;   LUMBAR LAMINECTOMY/DECOMPRESSION MICRODISCECTOMY Right 06/17/2013   Procedure: LUMBAR LAMINECTOMY MICRODISCECTOMY L4-L5 RIGHT EXCISION OF SYNOVIAL CYST RIGHT   (1 LEVEL) RIGHT PARTIAL FACETECTOMY;  Surgeon: Tanda DELENA Heading, MD;  Location: [facility] ORS;  Service: Orthopedics;  Laterality: Right;   MYELOGRAM  04/06/2013   lumbar, Dr Heading   ORBITAL LESION EXCISION Right 09/01/2018   Procedure: ORBITOTOMY WITHOUT BONE FLAP WITH REMOVAL OF LESION RIGHT;  Surgeon: Ashley Greig HERO, MD;  Location: [facility];  Service: Ophthalmology;  Laterality: Right;   PACEMAKER IMPLANT N/A 09/10/2017   Procedure: PACEMAKER IMPLANT;  Surgeon: Fernande Elspeth BROCKS, MD;  Location: [facility] INVASIVE CV LAB;  Service: Cardiovascular;  Laterality: N/A;   PANENDOSCOPY     PERIPHERAL VASCULAR BALLOON ANGIOPLASTY Left 03/16/2021   Procedure: PERIPHERAL VASCULAR BALLOON ANGIOPLASTY;  Surgeon: Magda Debby SAILOR, MD;  Location: [facility] INVASIVE CV LAB;  Service: Cardiovascular;  Laterality: Left;  subclavian  vein   PORTACATH PLACEMENT N/A 03/06/2017   Procedure:  INSERTION PORT-A-CATH AND ASPIRATE SEROMA LEFT AXILLA;  Surgeon: Gail Favorite, MD;  Location: [facility] OR;  Service: General;  Laterality: N/A;   RIGHT/LEFT HEART CATH AND CORONARY/GRAFT ANGIOGRAPHY N/A 07/28/2017   Procedure: RIGHT/LEFT HEART CATH AND CORONARY/GRAFT ANGIOGRAPHY;  Surgeon: Wonda Sharper, MD;  Location: [facility] INVASIVE CV LAB;  Service: Cardiovascular;  Laterality: N/A;   SHOULDER SURGERY Right 08/30/2010   screws placed; tendons tore off   SKIN CANCER EXCISION Right    neck   TONSILLECTOMY  ~ 1954   TOTAL KNEE ARTHROPLASTY Left 06/29/2019   Procedure: TOTAL KNEE ARTHROPLASTY;  Surgeon: Ernie Cough, MD;  Location: [facility] ORS;  Service: Orthopedics;  Laterality: Left;  70 mins   TRANSCATHETER AORTIC VALVE REPLACEMENT, TRANSFEMORAL N/A 09/09/2017   Procedure: TRANSCATHETER AORTIC VALVE REPLACEMENT, TRANSFEMORAL;  Surgeon: Cash Lonni JONETTA, MD;  Location: [facility] OR;  Service: Open Heart Surgery;  Laterality: N/A;   UPPER EXTREMITY VENOGRAPHY Left 02/15/2021   Procedure: CENTRAL VENO;  Surgeon: Magda Debby SAILOR, MD;  Location: [facility] INVASIVE CV LAB;  Service: Cardiovascular;  Laterality: Left;   UPPER EXTREMITY VENOGRAPHY N/A 03/16/2021   Procedure: UPPER EXTREMITY VENOGRAPHY;  Surgeon: Magda Debby SAILOR, MD;  Location: [facility] INVASIVE CV LAB;  Service: Cardiovascular;  Laterality: N/A;   UPPER GI ENDOSCOPY  07/02/2011   Gastritis; Dr Avram   VASECTOMY     wireless pacemaker placed      Family History  Problem Relation Age of Onset   Stroke Father    Hypertension Father    Pancreatic cancer Mother    Diabetes Maternal Grandmother    Stroke Maternal Grandmother    Heart attack Paternal Grandmother    Colon cancer Neg Hx    Esophageal cancer Neg Hx    Rectal cancer Neg Hx    Stomach cancer Neg Hx    Ulcers Neg Hx     Social History   Occupational History    Employer: RETIRED  Tobacco Use   Smoking status: Never    Passive exposure: Never   Smokeless tobacco: Never   Vaping Use   Vaping status: Never Used  Substance and Sexual Activity   Alcohol use: No    Alcohol/week: 0.0 standard drinks of alcohol    Comment: last drink was in 2012( 03/15/2014)   Drug use: No  Sexual activity: Not Currently     ROS   Objective:   Vitals: BP 136/77 (BP Location: Right Arm)   Pulse 70   Temp 98.1 F (36.7 C) (Oral)   Resp (!) 24   SpO2 94%   Physical Exam Constitutional:      General: He is not in acute distress.    Appearance: Normal appearance. He is well-developed and normal weight. He is not ill-appearing, toxic-appearing or diaphoretic.  HENT:     Head: Normocephalic and atraumatic.     Right Ear: External ear normal.     Left Ear: External ear normal.     Nose: Congestion present. No rhinorrhea.     Mouth/Throat:     Mouth: Mucous membranes are moist.     Pharynx: No pharyngeal swelling, oropharyngeal exudate, posterior oropharyngeal erythema or uvula swelling.     Tonsils: No tonsillar exudate or tonsillar abscesses. 0 on the right. 0 on the left.     Comments: Thick streaks of postnasal drainage overlying pharynx.  Eyes:     General: No scleral icterus.       Right eye: No discharge.        Left eye: No discharge.     Extraocular Movements: Extraocular movements intact.  Cardiovascular:     Rate and Rhythm: Normal rate and regular rhythm.     Heart sounds: Normal heart sounds. No murmur heard.    No friction rub. No gallop.  Pulmonary:     Effort: Pulmonary effort is normal. No respiratory distress.     Breath sounds: Normal breath sounds. No stridor. No wheezing, rhonchi or rales.  Chest:     Chest wall: No tenderness.  Musculoskeletal:     Cervical back: Normal range of motion.  Neurological:     Mental Status: He is alert and oriented to person, place, and time.  Psychiatric:        Mood and Affect: Mood normal.        Behavior: Behavior normal.        Thought Content: Thought content normal.        Judgment: Judgment  normal.    DG Chest 2 View Result Date: 07/08/2024 EXAM: 2 VIEW(S) XRAY OF THE CHEST 07/08/2024 05:52:51 PM COMPARISON: 04/14/2023 CLINICAL HISTORY: cough, wheezing FINDINGS: LINES, TUBES AND DEVICES: Right cardiac pacer in place. LUNGS AND PLEURA: No focal pulmonary opacity. No pleural effusion. No pneumothorax. HEART AND MEDIASTINUM: Aortic arch calcifications. Aortic valve replacement noted. BONES AND SOFT TISSUES: Left axillary surgical clips noted. Sternotomy wires present. Multilevel thoracic spine degenerative changes. IMPRESSION: 1. No acute findings. Electronically signed by: Elsie Gravely MD 07/08/2024 06:01 PM EST RP Workstation: [id number]     Assessment and Plan :   PDMP not reviewed this encounter.  1. Acute upper respiratory infection   2. Body aches   3. Wheezing      No signs of CHF, pneumonia.  Recommend holding azithromycin  as it is not helping him and start doxycycline  instead to address upper respiratory pathogens better.  Maintain all other medications.  Follow-up with PCP as soon as possible.  Strict monitoring of pulse oximetry recommended given his recent use of prednisone .  Counseled patient on potential for adverse effects with medications prescribed/recommended today, ER and return-to-clinic precautions discussed, patient verbalized understanding.     [1]  Allergies Allergen Reactions   Ace Inhibitors Swelling and Other (See Comments)    Angioedema    Benazepril Swelling and Other (See Comments)  Angioedema; he is not a candidate for any angiotensin receptor blockers because of this significant allergic reaction. Because of a history of documented adverse serious drug reaction;Medi Alert bracelet  is recommended   Entresto  [Sacubitril -Valsartan ] Swelling and Other (See Comments)    First-in-Class Angiotensin Receptor Neprilysin Inhibitor- Med was red-flagged by the patient's pharmacy for him to NOT take!   Hctz [Hydrochlorothiazide] Anaphylaxis and  Swelling    Tongue and lip swelling    Aspirin  Other (See Comments)    Gastritis, can aspirin  not take 325 mg aspirin      Christopher Savannah, PA-C 07/08/24 1840  "

## 2024-07-08 NOTE — ED Triage Notes (Addendum)
 Pt states he has not felt well x 2 weeks- he was seen and dx with bronchitis rx prednisone  on 1/2 where he reports he had a neg flu and strep test-states he didn't feel better and was started on a zpack/no in person follow up-states he feels worse with increase in congestion and feeling SHOB-DOE noted when ambulated to exam room

## 2024-07-08 NOTE — Discharge Instructions (Signed)
 Stop azithromycin , start doxycycline  for better help with your sinuses and upper respiratory airways. Monitor your oxygen  and if you continues to drop below 90% then go to the hospital.

## 2024-07-08 NOTE — Telephone Encounter (Signed)
 Copied from CRM [id number]. Topic: Clinical - Red Word Triage >> Jul 08, 2024  3:42 PM Alfonso ORN wrote: Red Word that prompted transfer to Nurse Triage: pt exp worsening symptoms of jaw pain, tooth pain , stuffy nose with yellow discharge , not able to sleep  FYI Only or Action Required?: No available appts until Monday - writer recommended UC. Pt is agreeable and plans to go to UC today. Please call the patient if any further recommendations from PCP.   Patient was last seen in primary care on 07/02/2024 by Frann Mabel Mt, DO.  Called Nurse Triage reporting Breathing Problem.  Symptoms began a week ago.  Interventions attempted: Nothing.  Symptoms are: gradually worsening.  Triage Disposition: Call PCP Now  Patient/caregiver understands and will follow disposition?:   Reason for Disposition  Oxygen  level (e.g., pulse oximetry) 91 to 94%  Answer Assessment - Initial Assessment Questions Pt calling to report that he is experiencing no relief from the cpap machine that was ordered. Pt had an OV a week ago - prescribed prednisone , cpap. Unable to sleep last 2 days, headaches, coughing, SpO2: 90 P:75, Pt states slight fever (unable to check).  Writer called the clinic to report worsening symptoms per discharge instructions from last OV on 07/02/24. No available appts until Monday - writer recommended UC. Pt is agreeable and plans to go to UC today. Please call the patient if any further recommendations from PCP.   1. RESPIRATORY STATUS: Describe your breathing? (e.g., wheezing, shortness of breath, unable to speak, severe coughing)      Wheezing, SOB at night 2. ONSET: When did this breathing problem begin?      A week ago 3. PATTERN Does the difficult breathing come and go, or has it been constant since it started?      Only difficulty breathing at night 4. SEVERITY: How bad is your breathing? (e.g., mild, moderate, severe)      Moderate night  5. RECURRENT SYMPTOM: Have  you had difficulty breathing before? If Yes, ask: When was the last time? and What happened that time?      N/a  6. CARDIAC HISTORY: Do you have any history of heart disease? (e.g., heart attack, angina, bypass surgery, angioplasty)      Yes  7. LUNG HISTORY: Do you have any history of lung disease?  (e.g., pulmonary embolus, asthma, emphysema)     Denies  8. CAUSE: What do you think is causing the breathing problem?      Cpap issues  9. OTHER SYMPTOMS: Do you have any other symptoms? (e.g., chest pain, cough, dizziness, fever, runny nose)     Denies  10. O2 SATURATION MONITOR:  Do you use an oxygen  saturation monitor (pulse oximeter) at home? If Yes, ask: What is your reading (oxygen  level) today? What is your usual oxygen  saturation reading? (e.g., 95%)       Yes- 90% today  Protocols used: Breathing Difficulty-A-AH

## 2024-07-08 NOTE — Telephone Encounter (Signed)
 Spoke with patient. Pt verbalized understanding

## 2024-07-09 NOTE — Telephone Encounter (Signed)
 Patient states that he feels much better than yesterday, not quite a 100% yet.  They changed the antibiotic and gave him a decongestant.

## 2024-07-13 ENCOUNTER — Other Ambulatory Visit: Payer: Self-pay

## 2024-07-13 DIAGNOSIS — M1991 Primary osteoarthritis, unspecified site: Secondary | ICD-10-CM | POA: Insufficient documentation

## 2024-07-13 DIAGNOSIS — Z972 Presence of dental prosthetic device (complete) (partial): Secondary | ICD-10-CM | POA: Insufficient documentation

## 2024-07-13 DIAGNOSIS — K5790 Diverticulosis of intestine, part unspecified, without perforation or abscess without bleeding: Secondary | ICD-10-CM | POA: Insufficient documentation

## 2024-07-13 DIAGNOSIS — M545 Low back pain, unspecified: Secondary | ICD-10-CM | POA: Insufficient documentation

## 2024-07-13 DIAGNOSIS — M1A09X0 Idiopathic chronic gout, multiple sites, without tophus (tophi): Secondary | ICD-10-CM | POA: Insufficient documentation

## 2024-07-13 DIAGNOSIS — K222 Esophageal obstruction: Secondary | ICD-10-CM | POA: Insufficient documentation

## 2024-07-13 DIAGNOSIS — M150 Primary generalized (osteo)arthritis: Secondary | ICD-10-CM | POA: Insufficient documentation

## 2024-07-13 DIAGNOSIS — G5793 Unspecified mononeuropathy of bilateral lower limbs: Secondary | ICD-10-CM | POA: Insufficient documentation

## 2024-07-13 DIAGNOSIS — Z8739 Personal history of other diseases of the musculoskeletal system and connective tissue: Secondary | ICD-10-CM | POA: Insufficient documentation

## 2024-07-14 ENCOUNTER — Other Ambulatory Visit: Payer: Self-pay | Admitting: Family Medicine

## 2024-07-14 ENCOUNTER — Other Ambulatory Visit: Payer: Self-pay | Admitting: Cardiology

## 2024-07-14 DIAGNOSIS — I483 Typical atrial flutter: Secondary | ICD-10-CM

## 2024-07-14 DIAGNOSIS — E1169 Type 2 diabetes mellitus with other specified complication: Secondary | ICD-10-CM

## 2024-07-15 NOTE — Progress Notes (Signed)
 " Cardiology Office Note:   Date:  07/16/2024  ID:  Richard [name], DOB 08/29/1940, MRN [id number] PCP: Antonio Meth, Jamee SAUNDERS, DO  [facility] Providers Cardiologist:  Lynwood Schilling, MD Electrophysiologist:  Elspeth Sage, MD (Inactive) {  History of Present Illness:   Richard [name] is a 84 y.o. male who presents for follow up of CAD and TAVR.  He has had atrial flutter and heart block as well necessitating a pacemaker.   He has had continued problems with triglycerides.  He has some renal insufficiency.    He had a leadless pacemaker placed.   This was in response to subclavian stenosis associated with a transvenous pacemaker.   He still has chronic thrombosis .  He also was in the hospital  at [facility] in [place] with acute sepsis.  The etiology was not clear.  He did recover from this.  He presented with altered mental status and loss of consciousness.  He has SOB in 2023 and had a perfusion study with no prior ischemia or infarct.  He had an EF of 30 - 35%.  He was back at [facility] after having had a [employer] dual-chamber pacemaker with left area bundle branch pacing inserted.      Since her last saw him he had a significant upper respiratory infection with sinus and has just gotten over this.  He had rounds of antibiotics and steroids.  But now he can get back to golfing.  When he is feeling well he does not have any significant symptoms.  He denies any PND or orthopnea.  He has not had any palpitations, presyncope or syncope.  He said no weight gain or edema.  I do note that he was in atrial fibrillation.  He is treated at [facility].  They noted this and they were planning a cardioversion but he went back into sinus rhythm.  It looks like he spends about 2 to 3% of the time in A-fib.  He might have it for hours at a time but he says he does not feel it and he does not have any increased dyspnea when it happens.  He tolerates his anticoagulation.  ROS: As stated in the HPI and negative  for all other systems.  Studies Reviewed:    EKG:     [name]  Risk Assessment/Calculations:    CHA2DS2-VASc Score = 6   This indicates a 9.7% annual risk of stroke. The patient's score is based upon: CHF History: 1 HTN History: 1 Diabetes History: 1 Stroke History: 0 Vascular Disease History: 1 Age Score: 2 Gender Score: 0  Physical Exam:   VS:  BP 128/70   Pulse 81   Ht 5' 11 (1.803 m)   Wt 221 lb 12.8 oz (100.6 kg)   SpO2 97%   BMI 30.93 kg/m    Wt Readings from Last 3 Encounters:  07/16/24 221 lb 12.8 oz (100.6 kg)  07/02/24 225 lb (102.1 kg)  06/17/24 224 lb 3.2 oz (101.7 kg)     GEN: Well nourished, well developed in no acute distress NECK: No JVD; No carotid bruits CARDIAC: RRR, no murmurs, rubs, gallops RESPIRATORY:  Clear to auscultation without rales, wheezing or rhonchi  ABDOMEN: Soft, non-tender, non-distended EXTREMITIES:  No edema; No deformity   ASSESSMENT AND PLAN:   Chronic systolic heart failure:    EF was 20 - 25% in October 2024.  He has not tolerated ACE inhibitor or Entresto  in the past.  His blood pressure is  not tolerated further med titration.  No change in therapy.  He seems to be euvolemic.  Hypertension:    The blood pressure is managed in the context of treating his cardiomyopathy.  Atrial fibrillation: He has paroxysms as noted above.  No change in therapy.   CAD s/p CABG 2001:   He had patent bypass at the time of his TAVR.  No change in therapy.  2:1 AVB: He has the  dual chamber pacemaker.   He is up-to-date with pacemaker follow-up at [facility].    AS s/p TAVR:    He had normal function on echo in 10/24.  He understands endocarditis prophylaxis.   OSA on CPAP: He uses CPAP.  He was just started on Mounjaro .  He lost about 9 pounds.  His sugars have been elevated with an A1c of DM: 7.8.  I will defer management to his primary provider.  Dyslipidemia:  He saw Dr. Mona.  Fenofibrate  was reduced to 54 mg daily due to his decreased GFR.  Fenofibrate  may actually worsen the GFR. He might be a candidate for Demetria in a few years once he gets expanded indications.  Continue current therapy.      Follow up with me in six months.   Signed, Lynwood Schilling, MD   "

## 2024-07-16 ENCOUNTER — Encounter: Payer: Self-pay | Admitting: Cardiology

## 2024-07-16 ENCOUNTER — Ambulatory Visit: Attending: Cardiology | Admitting: Cardiology

## 2024-07-16 VITALS — BP 128/70 | HR 81 | Ht 71.0 in | Wt 221.8 lb

## 2024-07-16 DIAGNOSIS — E785 Hyperlipidemia, unspecified: Secondary | ICD-10-CM | POA: Diagnosis present

## 2024-07-16 DIAGNOSIS — I4891 Unspecified atrial fibrillation: Secondary | ICD-10-CM | POA: Diagnosis present

## 2024-07-16 DIAGNOSIS — Z952 Presence of prosthetic heart valve: Secondary | ICD-10-CM | POA: Insufficient documentation

## 2024-07-16 DIAGNOSIS — I2510 Atherosclerotic heart disease of native coronary artery without angina pectoris: Secondary | ICD-10-CM | POA: Insufficient documentation

## 2024-07-16 DIAGNOSIS — I5022 Chronic systolic (congestive) heart failure: Secondary | ICD-10-CM | POA: Insufficient documentation

## 2024-07-16 NOTE — Patient Instructions (Signed)
 Medication Instructions:  Your physician recommends that you continue on your current medications as directed. Please refer to the Current Medication list given to you today.  *If you need a refill on your cardiac medications before your next appointment, please call your pharmacy*  Lab Work: NONE If you have labs (blood work) drawn today and your tests are completely normal, you will receive your results only by: MyChart Message (if you have MyChart) OR A paper copy in the mail If you have any lab test that is abnormal or we need to change your treatment, we will call you to review the results.  Testing/Procedures: NONE  Follow-Up: At [facility], you and your health needs are our priority.  As part of our continuing mission to provide you with exceptional heart care, our providers are all part of one team.  This team includes your primary Cardiologist (physician) and Advanced Practice Providers or APPs (Physician Assistants and Nurse Practitioners) who all work together to provide you with the care you need, when you need it.  Your next appointment:   6 month(s)  Provider:   Eilleen Grates, MD    We recommend signing up for the patient portal called "MyChart".  Sign up information is provided on this After Visit Summary.  MyChart is used to connect with patients for Virtual Visits (Telemedicine).  Patients are able to view lab/test results, encounter notes, upcoming appointments, etc.  Non-urgent messages can be sent to your provider as well.   To learn more about what you can do with MyChart, go to [email or web address].

## 2024-07-19 ENCOUNTER — Other Ambulatory Visit: Admitting: Pharmacist

## 2024-07-19 ENCOUNTER — Other Ambulatory Visit: Payer: Self-pay | Admitting: Cardiology

## 2024-07-19 DIAGNOSIS — E1165 Type 2 diabetes mellitus with hyperglycemia: Secondary | ICD-10-CM

## 2024-07-19 DIAGNOSIS — Z7985 Long-term (current) use of injectable non-insulin antidiabetic drugs: Secondary | ICD-10-CM

## 2024-07-19 DIAGNOSIS — E1169 Type 2 diabetes mellitus with other specified complication: Secondary | ICD-10-CM

## 2024-07-19 NOTE — Progress Notes (Signed)
 "  07/19/2024 Name: Richard [name] MRN: [id number] DOB: [birth year]  Chief Complaint  Patient presents with   Diabetes   Medication Management    KHOA OPDAHL is a 84 y.o. year old male who presented for a telephone visit.   They were referred to the pharmacist by their PCP for assistance in managing diabetes, hyperlipidemia/cardiovascular risk reduction, and complex medication management.    Subjective:  Care Team: Primary Care Provider: Antonio Meth, Jamee SAUNDERS, DO ; Next Scheduled Visit: not yet scheduled/ Last visit was 06/17/2024 Cardiologist: lipid specialist - Dr Mona; Next Scheduled Visit: recalled set for June 2026 Dermatologist: Dr Jackquline: Next appointment 08/30/2024 Hematology/ Oncology - Camie Pepper; Next appointment: 07/20/2024  Medication Access/Adherence  Current Pharmacy:  [id number] [place], [place] - [address] [place] [address] Phone: [phone] Fax: [phone]  [facility] - [facility] [address] [place] [address] Phone: [phone] Fax: [phone]   Patient reports affordability concerns with their medications: Yes  Patient reports access/transportation concerns to their pharmacy: No  Patient reports adherence concerns with their medications:  No      Diabetes:  Current medications: Mounjaro  7.5mg  weekly (has taken 4 doses so far), glimepiride  4mg  daily, Farxiga  10mg  daily  Past therapies: metformin  stopped due to rising Scr. Trulicity  - changed to see if better efficacy with Mounjaro  Initially recommended Ozempic  instead of Trulicity  but Trulicity  was preferred by patient's insurance.  Current glucose readings:  today blood glucose was 190. Usually in the 130 - 140's.   Patient denies hypoglycemic s/sx including no dizziness, shakiness, sweating. Patient denies hyperglycemic symptoms including no polyuria, polydipsia, polyphagia, nocturia,  blurred vision but he does report that he has had increase in neuropathy. He is currently taking pregabalin  150mg  twice a day for neuropathy  Wt Readings from Last 3 Encounters:  07/16/24 221 lb 12.8 oz (100.6 kg)  07/02/24 225 lb (102.1 kg)  06/17/24 224 lb 3.2 oz (101.7 kg)   Started Mounjaro  in November 2025.  Starting weight = 230 lbs Current weight = 221 lbs (was 214lbs at home this morning)  Patient has lost about 9 to 15 lbs since he started Mounjaro .   Diet:  Morning: scrambled eggs, sausage, toast Snack around lunch: pork rinds Evening meal: tyson chicken pattie, green beans, onions and tomatoes  Macrovascular and Microvascular Risk Reduction:  Statin? yes (atorvastatin  80mg  daily ); ACEi/ARB? No - intolerant / history of angioedema Last urinary albumin /creatinine ratio:  Lab Results  Component Value Date   MICRALBCREAT Unable to calculate 06/17/2024   MICRALBCREAT 21.4 03/18/2024   MICRALBCREAT 5 12/19/2023   MICRALBCREAT <16 11/07/2023   MICRALBCREAT 23 08/07/2023   MICRALBCREAT NOTE 04/18/2020   MICRALBCREAT 5 03/15/2016   MICRALBCREAT 3.1 08/15/2009   MICRALBCREAT 2.9 12/15/2008   MICRALBCREAT 2.8 08/15/2008   MICRALBCREAT 1.8 05/26/2006   Last eye exam:  Lab Results  Component Value Date   HMDIABEYEEXA No Retinopathy 08/19/2023   Last foot exam: No foot exam found Tobacco Use:  Tobacco Use: Low Risk (07/16/2024)   Patient History    Smoking Tobacco Use: Never    Smokeless Tobacco Use: Never    Passive Exposure: Never   Hyperlipidemia/ASCVD Risk Reduction  Current lipid lowering medications: atorvastatin  80mg  daily, Vasepa 4 per day, ezetimibe  10mg  daily and fenofibrate  54mg  daily (dose was recently lowered based on eGFR)   Patient had initial visit with Dr Mona 04/22/2024 for elevated  triglycerides. Dr Mona noted - unlikely he has a genetic familial chylomicronemia syndrome and therefore would not be a candidate at this time for Tryngolza. Expanding  approval however for this medicine is expected in the next few years. Patient's triglycerides have decreased form > 700 to 175 on 06/17/2024   Current physical activity: golfs weekly  Current medication access support: [employer] thru 06/02/2025    Objective:  Lab Results  Component Value Date   HGBA1C 7.8 (H) 06/17/2024    Lab Results  Component Value Date   CREATININE 1.56 (H) 06/17/2024   BUN 46 (H) 06/17/2024   NA 142 06/17/2024   K 4.7 06/17/2024   CL 100 06/17/2024   CO2 31 06/17/2024    Lab Results  Component Value Date   CHOL 158 06/17/2024   HDL 43.00 06/17/2024   LDLCALC 80 06/17/2024   LDLDIRECT 52.0 03/18/2024   TRIG 175.0 (H) 06/17/2024   CHOLHDL 4 06/17/2024    Medications Reviewed Today   Medications were not reviewed in this encounter      Card No.: [id number] BIN: [id number] PCN: [id number] PC Group; [id number]  Assessment/Plan:   Diabetes: - Currently uncontrolled; goal A1c <7%. Cardiorenal risk reduction is optimized.. Blood pressure is at goal <130/80. LDL is at goal.  - Reviewed long term cardiovascular and renal outcomes of uncontrolled blood sugar. and Reviewed goal A1c, goal fasting, and goal 2 hour post prandial glucose. Recommended to check glucose 1 to 2 times per day - Continue Mounjaro  7.5mg  weekly. Plan to check A1c and weight in March 2026, if not at goal will increase Mounjaro  as tolerated (patient currently has #7 pens of Mounjaro  7.5mg  on hand)  - Continue glimepiride  and Farxiga  at current dose.  - Reviewed side effects of gastrointestinal upset/nausea; eating smaller meals, avoiding high-fat foods, and remaining upright after eating may reduce nausea. Discussed that overeating is a major trigger of nausea with this class of medications, as often times patients will start to feel full sooner and may need to decrease portion sizes from what they were previously accustomed to.    Hyperlipidemia/ASCVD Risk Reduction:Currently LDL at  goal of < 55 but triglycerides remain above goal of < 150 but much improved.  - Reviewed long term complications of uncontrolled cholesterol - Recommend to contnue current therapy.  - Meets financial criteria for [employer] approved thru 06/02/2025  Medication Management:  - Reviewed dose of Eliquis  - 2.5mg  twice a day (adjusted based on Scr = 1.56 and age = 83yo; wt = 100kg)   Follow Up Plan: 2 months  Madelin Ray, PharmD Clinical Pharmacist [facility]    "

## 2024-07-20 ENCOUNTER — Inpatient Hospital Stay: Attending: Hematology & Oncology

## 2024-07-20 ENCOUNTER — Inpatient Hospital Stay: Admitting: Family

## 2024-07-20 ENCOUNTER — Inpatient Hospital Stay

## 2024-07-20 VITALS — BP 131/81 | HR 74 | Temp 97.6°F | Resp 19 | Ht 71.0 in | Wt 220.0 lb

## 2024-07-20 DIAGNOSIS — D509 Iron deficiency anemia, unspecified: Secondary | ICD-10-CM

## 2024-07-20 DIAGNOSIS — D510 Vitamin B12 deficiency anemia due to intrinsic factor deficiency: Secondary | ICD-10-CM | POA: Diagnosis present

## 2024-07-20 DIAGNOSIS — C8332 Diffuse large B-cell lymphoma, intrathoracic lymph nodes: Secondary | ICD-10-CM | POA: Insufficient documentation

## 2024-07-20 DIAGNOSIS — D518 Other vitamin B12 deficiency anemias: Secondary | ICD-10-CM

## 2024-07-20 DIAGNOSIS — D519 Vitamin B12 deficiency anemia, unspecified: Secondary | ICD-10-CM

## 2024-07-20 DIAGNOSIS — Z950 Presence of cardiac pacemaker: Secondary | ICD-10-CM | POA: Diagnosis not present

## 2024-07-20 DIAGNOSIS — I4891 Unspecified atrial fibrillation: Secondary | ICD-10-CM | POA: Insufficient documentation

## 2024-07-20 DIAGNOSIS — G629 Polyneuropathy, unspecified: Secondary | ICD-10-CM | POA: Diagnosis not present

## 2024-07-20 DIAGNOSIS — Z7901 Long term (current) use of anticoagulants: Secondary | ICD-10-CM | POA: Diagnosis not present

## 2024-07-20 DIAGNOSIS — E611 Iron deficiency: Secondary | ICD-10-CM | POA: Diagnosis present

## 2024-07-20 LAB — VITAMIN B12: Vitamin B-12: 646 pg/mL (ref 180–914)

## 2024-07-20 LAB — CMP (CANCER CENTER ONLY)
ALT: 37 U/L (ref 0–44)
AST: 28 U/L (ref 15–41)
Albumin: 4.3 g/dL (ref 3.5–5.0)
Alkaline Phosphatase: 105 U/L (ref 38–126)
Anion gap: 12 (ref 5–15)
BUN: 52 mg/dL — ABNORMAL HIGH (ref 8–23)
CO2: 28 mmol/L (ref 22–32)
Calcium: 10 mg/dL (ref 8.9–10.3)
Chloride: 103 mmol/L (ref 98–111)
Creatinine: 1.61 mg/dL — ABNORMAL HIGH (ref 0.61–1.24)
GFR, Estimated: 42 mL/min — ABNORMAL LOW
Glucose, Bld: 199 mg/dL — ABNORMAL HIGH (ref 70–99)
Potassium: 4.5 mmol/L (ref 3.5–5.1)
Sodium: 142 mmol/L (ref 135–145)
Total Bilirubin: 0.7 mg/dL (ref 0.0–1.2)
Total Protein: 7.1 g/dL (ref 6.5–8.1)

## 2024-07-20 LAB — IRON AND IRON BINDING CAPACITY (CC-WL,HP ONLY)
Iron: 97 ug/dL (ref 45–182)
Saturation Ratios: 30 % (ref 17.9–39.5)
TIBC: 321 ug/dL (ref 250–450)
UIBC: 224 ug/dL

## 2024-07-20 LAB — CBC WITH DIFFERENTIAL (CANCER CENTER ONLY)
Abs Immature Granulocytes: 0.09 K/uL — ABNORMAL HIGH (ref 0.00–0.07)
Basophils Absolute: 0 K/uL (ref 0.0–0.1)
Basophils Relative: 1 %
Eosinophils Absolute: 0.2 K/uL (ref 0.0–0.5)
Eosinophils Relative: 2 %
HCT: 44.5 % (ref 39.0–52.0)
Hemoglobin: 14.5 g/dL (ref 13.0–17.0)
Immature Granulocytes: 1 %
Lymphocytes Relative: 22 %
Lymphs Abs: 1.6 K/uL (ref 0.7–4.0)
MCH: 29.5 pg (ref 26.0–34.0)
MCHC: 32.6 g/dL (ref 30.0–36.0)
MCV: 90.6 fL (ref 80.0–100.0)
Monocytes Absolute: 0.5 K/uL (ref 0.1–1.0)
Monocytes Relative: 7 %
Neutro Abs: 5 K/uL (ref 1.7–7.7)
Neutrophils Relative %: 67 %
Platelet Count: 182 K/uL (ref 150–400)
RBC: 4.91 MIL/uL (ref 4.22–5.81)
RDW: 16.6 % — ABNORMAL HIGH (ref 11.5–15.5)
WBC Count: 7.5 K/uL (ref 4.0–10.5)
nRBC: 0 % (ref 0.0–0.2)

## 2024-07-20 LAB — FERRITIN: Ferritin: 2271 ng/mL — ABNORMAL HIGH (ref 24–336)

## 2024-07-20 MED ORDER — CYANOCOBALAMIN 1000 MCG/ML IJ SOLN
1000.0000 ug | Freq: Once | INTRAMUSCULAR | Status: AC
  Administered 2024-07-20: 1000 ug via INTRAMUSCULAR
  Filled 2024-07-20: qty 1

## 2024-07-20 NOTE — Patient Instructions (Signed)
 Vitamin B12 Injection What is this medication? Vitamin B12 (VAHY tuh min B12) prevents and treats low vitamin B12 levels in your body. It is used in people who do not get enough vitamin B12 from their diet or when their digestive tract does not absorb enough. Vitamin B12 plays an important role in maintaining the health of your nervous system and red blood cells. This medicine may be used for other purposes; ask your health care provider or pharmacist if you have questions. COMMON BRAND NAME(S): B-12 Compliance Kit, B-12 Injection Kit, Cyomin, Dodex, LA-12, Nutri-Twelve, Physicians EZ Use B-12, Primabalt, Vitamin Deficiency Injectable System - B12 What should I tell my care team before I take this medication? They need to know if you have any of these conditions: Kidney disease Leber's disease Megaloblastic anemia An unusual or allergic reaction to cyanocobalamin, cobalt, other medications, foods, dyes, or preservatives Pregnant or trying to get pregnant Breast-feeding How should I use this medication? This medication is injected into a muscle or deeply under the skin. It is usually given in a clinic or care team's office. However, your care team may teach you how to inject yourself. Follow all instructions. Talk to your care team about the use of this medication in children. Special care may be needed. Overdosage: If you think you have taken too much of this medicine contact a poison control center or emergency room at once. NOTE: This medicine is only for you. Do not share this medicine with others. What if I miss a dose? If you are given your dose at a clinic or care team's office, call to reschedule your appointment. If you give your own injections, and you miss a dose, take it as soon as you can. If it is almost time for your next dose, take only that dose. Do not take double or extra doses. What may interact with this medication? Alcohol Colchicine This list may not describe all possible  interactions. Give your health care provider a list of all the medicines, herbs, non-prescription drugs, or dietary supplements you use. Also tell them if you smoke, drink alcohol, or use illegal drugs. Some items may interact with your medicine. What should I watch for while using this medication? Visit your care team regularly. You may need blood work done while you are taking this medication. You may need to follow a special diet. Talk to your care team. Limit your alcohol intake and avoid smoking to get the best benefit. What side effects may I notice from receiving this medication? Side effects that you should report to your care team as soon as possible: Allergic reactions--skin rash, itching, hives, swelling of the face, lips, tongue, or throat Swelling of the ankles, hands, or feet Trouble breathing Side effects that usually do not require medical attention (report to your care team if they continue or are bothersome): Diarrhea This list may not describe all possible side effects. Call your doctor for medical advice about side effects. You may report side effects to FDA at 1-800-FDA-1088. Where should I keep my medication? Keep out of the reach of children. Store at room temperature between 15 and 30 degrees C (59 and 85 degrees F). Protect from light. Throw away any unused medication after the expiration date. NOTE: This sheet is a summary. It may not cover all possible information. If you have questions about this medicine, talk to your doctor, pharmacist, or health care provider.  2024 Elsevier/Gold Standard (2021-02-27 00:00:00) Denosumab Injection (Oncology) What is this medication? DENOSUMAB (  den oh SUE mab) prevents weakened bones caused by cancer. It may also be used to treat noncancerous bone tumors that cannot be removed by surgery. It can also be used to treat high calcium levels in the blood caused by cancer. It works by blocking a protein that causes bones to break down  quickly. This slows down the release of calcium from bones, which lowers calcium levels in your blood. It also makes your bones stronger and less likely to break (fracture). This medicine may be used for other purposes; ask your health care provider or pharmacist if you have questions. COMMON BRAND NAME(S): XGEVA What should I tell my care team before I take this medication? They need to know if you have any of these conditions: Dental disease Having surgery or tooth extraction Infection Kidney disease Low levels of calcium or vitamin D in the blood Malnutrition On hemodialysis Skin conditions or sensitivity Thyroid or parathyroid disease An unusual reaction to denosumab, other medications, foods, dyes, or preservatives Pregnant or trying to get pregnant Breast-feeding How should I use this medication? This medication is for injection under the skin. It is given by your care team in a hospital or clinic setting. A special MedGuide will be given to you before each treatment. Be sure to read this information carefully each time. Talk to your care team about the use of this medication in children. While it may be prescribed for children as young as 13 years for selected conditions, precautions do apply. Overdosage: If you think you have taken too much of this medicine contact a poison control center or emergency room at once. NOTE: This medicine is only for you. Do not share this medicine with others. What if I miss a dose? Keep appointments for follow-up doses. It is important not to miss your dose. Call your care team if you are unable to keep an appointment. What may interact with this medication? Do not take this medication with any of the following: Other medications containing denosumab This medication may also interact with the following: Medications that lower your chance of fighting infection Steroid medications, such as prednisone or cortisone This list may not describe all  possible interactions. Give your health care provider a list of all the medicines, herbs, non-prescription drugs, or dietary supplements you use. Also tell them if you smoke, drink alcohol, or use illegal drugs. Some items may interact with your medicine. What should I watch for while using this medication? Your condition will be monitored carefully while you are receiving this medication. You may need blood work while taking this medication. This medication may increase your risk of getting an infection. Call your care team for advice if you get a fever, chills, sore throat, or other symptoms of a cold or flu. Do not treat yourself. Try to avoid being around people who are sick. You should make sure you get enough calcium and vitamin D while you are taking this medication, unless your care team tells you not to. Discuss the foods you eat and the vitamins you take with your care team. Some people who take this medication have severe bone, joint, or muscle pain. This medication may also increase your risk for jaw problems or a broken thigh bone. Tell your care team right away if you have severe pain in your jaw, bones, joints, or muscles. Tell your care team if you have any pain that does not go away or that gets worse. Talk to your care team if you may  be pregnant. Serious birth defects can occur if you take this medication during pregnancy and for 5 months after the last dose. You will need a negative pregnancy test before starting this medication. Contraception is recommended while taking this medication and for 5 months after the last dose. Your care team can help you find the option that works for you. What side effects may I notice from receiving this medication? Side effects that you should report to your care team as soon as possible: Allergic reactions--skin rash, itching, hives, swelling of the face, lips, tongue, or throat Bone, joint, or muscle pain Low calcium level--muscle pain or cramps,  confusion, tingling, or numbness in the hands or feet Osteonecrosis of the jaw--pain, swelling, or redness in the mouth, numbness of the jaw, poor healing after dental work, unusual discharge from the mouth, visible bones in the mouth Side effects that usually do not require medical attention (report to your care team if they continue or are bothersome): Cough Diarrhea Fatigue Headache Nausea This list may not describe all possible side effects. Call your doctor for medical advice about side effects. You may report side effects to FDA at 1-800-FDA-1088. Where should I keep my medication? This medication is given in a hospital or clinic. It will not be stored at home. NOTE: This sheet is a summary. It may not cover all possible information. If you have questions about this medicine, talk to your doctor, pharmacist, or health care provider.  2024 Elsevier/Gold Standard (2021-11-07 00:00:00)

## 2024-07-20 NOTE — Progress Notes (Signed)
 " Hematology and Oncology Follow Up Visit  Richard [name] [id number] 07/08/1940 84 y.o. 07/20/2024   Principle Diagnosis:  R-CHOP - s/p cycle 8 - completed 08/2017   Current Therapy:        Vitamin B12 1 mg IM every month Xgeva  120 mg subcu q 3 months - next dose due in 08/2024 IV iron as indicated   Interim History:  Richard [name] is here today for follow-up and B 12 injection. He is getting over a recent sinus infection. He started taking a medrol  does pack and zpack without improvement and then switched to Amoxil  which has significantly improved his symptoms. He took his last pill this morning.  No fever, chills, n/v, cough, rash, dizziness, SOB, chest pain, palpitations, abdominal pain or changes in bowel or bladder habits.  He states he went for cardioversion a month ago with EP and had flipped back into NSR on his own so procedure was cancelled.  No swelling in his extremities.  Neuropathy unchanged from baseline.   No falls or syncope reported.  Appetite and hydration are good. Weight is stable at 220 lbs.   ECOG Performance Status: 1 - Symptomatic but completely ambulatory  Medications:  Allergies as of 07/20/2024       Reactions   Ace Inhibitors Swelling, Other (See Comments)   Angioedema   Benazepril Swelling, Other (See Comments)   Angioedema; he is not a candidate for any angiotensin receptor blockers because of this significant allergic reaction. Because of a history of documented adverse serious drug reaction;Medi Alert bracelet  is recommended   Entresto  [sacubitril -valsartan ] Swelling, Other (See Comments)   First-in-Class Angiotensin Receptor Neprilysin Inhibitor- Med was red-flagged by the patient's pharmacy for him to NOT take!   Hctz [hydrochlorothiazide] Anaphylaxis, Swelling   Tongue and lip swelling   Aspirin  Other (See Comments)   Gastritis, can aspirin  not take 325 mg aspirin         Medication List        Accurate as of July 20, 2024  8:46 AM.  If you have any questions, ask your nurse or doctor.          acetaminophen  325 MG tablet Commonly known as: TYLENOL  Take 650 mg by mouth at bedtime as needed for moderate pain (pain score 4-6) or headache.   allopurinol  300 MG tablet Commonly known as: ZYLOPRIM  Take 450 mg by mouth daily.   aluminum hydroxide-magnesium  carbonate 95-358 MG/15ML Susp Commonly known as: GAVISCON Take 15 mLs by mouth as needed for indigestion or heartburn.   amoxicillin  500 MG capsule Commonly known as: AMOXIL  Take 500 mg by mouth 2 (two) times daily.   atorvastatin  80 MG tablet Commonly known as: LIPITOR  TAKE 1 TABLET BY MOUTH EVERYDAY AT BEDTIME   Azelastine  HCl 137 MCG/SPRAY Soln PLACE 2 SPRAYS INTO BOTH NOSTRILS AT BEDTIME AS NEEDED FOR RHINITIS OR ALLERGIES.   CO Q-10 PO Take 100 mg by mouth daily.   doxycycline  50 MG tablet Commonly known as: ADOXA Take 100 mg by mouth 2 (two) times daily.   Eliquis  2.5 MG Tabs tablet Generic drug: apixaban  TAKE 1 TABLET BY MOUTH TWICE A DAY   esomeprazole  40 MG capsule Commonly known as: NexIUM  Take 1 capsule (40 mg total) by mouth daily.   ezetimibe  10 MG tablet Commonly known as: ZETIA  TAKE 1 TABLET BY MOUTH DAILY.   Farxiga  10 MG Tabs tablet Generic drug: dapagliflozin  propanediol Take 10 mg by mouth in the morning.   fenofibrate  54 MG tablet  Take 1 tablet (54 mg total) by mouth daily.   folic acid  1 MG tablet Commonly known as: FOLVITE  TAKE 2 TABLETS BY MOUTH EVERY DAY   freestyle lancets USE ONCE A DAY TO CHECK BLOOD SUGAR.   FREESTYLE LITE test strip Generic drug: glucose blood USE TO TEST BLOOD SUGAR ONCE A DAY. DX CODE: E11.9   glimepiride  4 MG tablet Commonly known as: AMARYL  Take 4 mg by mouth daily.   ICY HOT ADVANCED PAIN RELIEF EX Apply 1 application  topically daily as needed (to painful sites).   metoprolol  succinate 25 MG 24 hr tablet Commonly known as: TOPROL -XL TAKE 1 TABLET (25 MG TOTAL) BY MOUTH  DAILY.   nitroGLYCERIN  0.4 MG SL tablet Commonly known as: NITROSTAT  PLACE 1 TABLET UNDER THE TONGUE EVERY 5 MINUTES AS NEEDED FOR CHEST PAIN.   potassium chloride  SA 20 MEQ tablet Commonly known as: KLOR-CON  M TAKE 2 TABLETS BY MOUTH EVERY DAY   pregabalin  150 MG capsule Commonly known as: LYRICA  1 po tid   tirzepatide  7.5 MG/0.5ML Pen Commonly known as: MOUNJARO  Inject 7.5 mg into the skin once a week.   torsemide  20 MG tablet Commonly known as: DEMADEX  TAKE 2 TAB BY MOUTH IN MORNING TAKE EXTRA TABLET FOR WEIGHT GAIN MORE THAN 2-3LBS IN 24HR FOR 90DAY   Vascepa  1 g capsule Generic drug: icosapent  Ethyl TAKE 2 CAPSULES BY MOUTH 2 TIMES DAILY. (DAW9 VASCEPA )   VITAMIN C  PO Take 1 tablet by mouth daily with breakfast.        Allergies: Allergies[1]  Past Medical History, Surgical history, Social history, and Family History were reviewed and updated.  Review of Systems: All other 10 point review of systems is negative.   Physical Exam:  height is 5' 11 (1.803 m) and weight is 220 lb (99.8 kg). His oral temperature is 97.6 F (36.4 C). His blood pressure is 131/81 and his pulse is 74. His respiration is 19 and oxygen  saturation is 93%.   Wt Readings from Last 3 Encounters:  07/20/24 220 lb (99.8 kg)  07/16/24 221 lb 12.8 oz (100.6 kg)  07/02/24 225 lb (102.1 kg)    Ocular: Sclerae unicteric, pupils equal, round and reactive to light Ear-nose-throat: Oropharynx clear, dentition fair Lymphatic: No cervical or supraclavicular adenopathy Lungs no rales or rhonchi, good excursion bilaterally Heart regular rate and rhythm, no murmur appreciated Abd soft, nontender, positive bowel sounds MSK no focal spinal tenderness, no joint edema Neuro: non-focal, well-oriented, appropriate affect Breasts: Deferred   Lab Results  Component Value Date   WBC 7.5 07/20/2024   HGB 14.5 07/20/2024   HCT 44.5 07/20/2024   MCV 90.6 07/20/2024   PLT 182 07/20/2024   Lab Results   Component Value Date   FERRITIN 1,887 (H) 06/15/2024   IRON 91 06/15/2024   TIBC 356 06/15/2024   UIBC 265 06/15/2024   IRONPCTSAT 26 06/15/2024   Lab Results  Component Value Date   RETICCTPCT 1.7 04/07/2023   RBC 4.91 07/20/2024   RETICCTABS 52.1 11/22/2011   No results found for: JONATHAN BONG [facility] Lab Results  Component Value Date   IGA 159 03/09/2012   Lab Results  Component Value Date   ALBUMINELP 4.2 07/27/2018   MSPIKE Not Observed 07/27/2018     Chemistry      Component Value Date/Time   NA 142 07/20/2024 0803   NA 138 02/08/2022 0811   NA 144 06/27/2017 0857   K 4.5 07/20/2024 0803   K 3.9  06/27/2017 0857   CL 103 07/20/2024 0803   CL 103 06/27/2017 0857   CO2 28 07/20/2024 0803   CO2 26 06/27/2017 0857   BUN 52 (H) 07/20/2024 0803   BUN 46 (H) 02/08/2022 0811   BUN 12 06/27/2017 0857   CREATININE 1.61 (H) 07/20/2024 0803   CREATININE 2.01 (H) 12/19/2023 1443      Component Value Date/Time   CALCIUM  10.0 07/20/2024 0803   CALCIUM  9.2 06/27/2017 0857   ALKPHOS 105 07/20/2024 0803   ALKPHOS 128 (H) 06/27/2017 0857   AST 28 07/20/2024 0803   ALT 37 07/20/2024 0803   ALT 24 06/27/2017 0857   BILITOT 0.7 07/20/2024 0803       Impression and Plan:  Richard [name] is a very pleasant 84 yo caucasian gentleman with diffuse large B-cell lymphoma (not double hit lymphoma). He completed treatment in February 2019.  We will proceed with B 12 injection  Iron studies pending. We will replace if needed.  CBC and CMP routed to Dr. ALONSO Stank with nephrology.  Follow-up in 1 month.    Lauraine Pepper, NP 1/20/20268:46 AM     [1]  Allergies Allergen Reactions   Ace Inhibitors Swelling and Other (See Comments)    Angioedema    Benazepril Swelling and Other (See Comments)    Angioedema; he is not a candidate for any angiotensin receptor blockers because of this significant allergic reaction. Because of a history of documented adverse  serious drug reaction;Medi Alert bracelet  is recommended   Entresto  [Sacubitril -Valsartan ] Swelling and Other (See Comments)    First-in-Class Angiotensin Receptor Neprilysin Inhibitor- Med was red-flagged by the patient's pharmacy for him to NOT take!   Hctz [Hydrochlorothiazide] Anaphylaxis and Swelling    Tongue and lip swelling    Aspirin  Other (See Comments)    Gastritis, can aspirin  not take 325 mg aspirin    "

## 2024-07-26 NOTE — Telephone Encounter (Signed)
 Lipids done on 06/17/24

## 2024-08-17 ENCOUNTER — Inpatient Hospital Stay

## 2024-08-17 ENCOUNTER — Inpatient Hospital Stay: Admitting: Family

## 2024-08-30 ENCOUNTER — Ambulatory Visit: Admitting: Dermatology

## 2024-09-15 ENCOUNTER — Ambulatory Visit

## 2024-10-05 ENCOUNTER — Ambulatory Visit
